# Patient Record
Sex: Male | Born: 1960 | Race: White | Hispanic: No | Smoking: Current every day smoker
Health system: Southern US, Community
[De-identification: ages and names within clinical notes are randomized; demographics above are authoritative.]

## PROBLEM LIST (undated history)

## (undated) DIAGNOSIS — I2510 Atherosclerotic heart disease of native coronary artery without angina pectoris: Secondary | ICD-10-CM

## (undated) DIAGNOSIS — G4730 Sleep apnea, unspecified: Secondary | ICD-10-CM

## (undated) DIAGNOSIS — I8390 Asymptomatic varicose veins of unspecified lower extremity: Secondary | ICD-10-CM

## (undated) DIAGNOSIS — N400 Enlarged prostate without lower urinary tract symptoms: Secondary | ICD-10-CM

## (undated) DIAGNOSIS — M14671 Charcot's joint, right ankle and foot: Secondary | ICD-10-CM

## (undated) DIAGNOSIS — G629 Polyneuropathy, unspecified: Secondary | ICD-10-CM

## (undated) DIAGNOSIS — E785 Hyperlipidemia, unspecified: Secondary | ICD-10-CM

## (undated) DIAGNOSIS — G2581 Restless legs syndrome: Secondary | ICD-10-CM

## (undated) DIAGNOSIS — I771 Stricture of artery: Secondary | ICD-10-CM

## (undated) DIAGNOSIS — M1990 Unspecified osteoarthritis, unspecified site: Secondary | ICD-10-CM

## (undated) DIAGNOSIS — I10 Essential (primary) hypertension: Secondary | ICD-10-CM

## (undated) DIAGNOSIS — J45909 Unspecified asthma, uncomplicated: Secondary | ICD-10-CM

## (undated) DIAGNOSIS — I779 Disorder of arteries and arterioles, unspecified: Secondary | ICD-10-CM

## (undated) DIAGNOSIS — I5042 Chronic combined systolic (congestive) and diastolic (congestive) heart failure: Secondary | ICD-10-CM

## (undated) DIAGNOSIS — I739 Peripheral vascular disease, unspecified: Secondary | ICD-10-CM

## (undated) DIAGNOSIS — Z720 Tobacco use: Secondary | ICD-10-CM

## (undated) DIAGNOSIS — E1140 Type 2 diabetes mellitus with diabetic neuropathy, unspecified: Secondary | ICD-10-CM

## (undated) DIAGNOSIS — K469 Unspecified abdominal hernia without obstruction or gangrene: Secondary | ICD-10-CM

## (undated) DIAGNOSIS — E11621 Type 2 diabetes mellitus with foot ulcer: Secondary | ICD-10-CM

## (undated) DIAGNOSIS — I255 Ischemic cardiomyopathy: Secondary | ICD-10-CM

## (undated) DIAGNOSIS — J449 Chronic obstructive pulmonary disease, unspecified: Secondary | ICD-10-CM

## (undated) DIAGNOSIS — E119 Type 2 diabetes mellitus without complications: Secondary | ICD-10-CM

## (undated) DIAGNOSIS — R55 Syncope and collapse: Secondary | ICD-10-CM

## (undated) DIAGNOSIS — I509 Heart failure, unspecified: Secondary | ICD-10-CM

## (undated) DIAGNOSIS — I4819 Other persistent atrial fibrillation: Secondary | ICD-10-CM

## (undated) DIAGNOSIS — I7090 Unspecified atherosclerosis: Secondary | ICD-10-CM

## (undated) DIAGNOSIS — L97519 Non-pressure chronic ulcer of other part of right foot with unspecified severity: Secondary | ICD-10-CM

## (undated) HISTORY — DX: Unspecified abdominal hernia without obstruction or gangrene: K46.9

## (undated) HISTORY — PX: PERIPHERAL ARTERIAL STENT GRAFT: SHX2220

## (undated) HISTORY — PX: CLAVICLE SURGERY: SHX598

## (undated) HISTORY — PX: APPENDECTOMY: SHX54

## (undated) HISTORY — DX: Peripheral vascular disease, unspecified: I73.9

## (undated) HISTORY — DX: Tobacco use: Z72.0

## (undated) HISTORY — DX: Syncope and collapse: R55

## (undated) HISTORY — DX: Other persistent atrial fibrillation: I48.19

## (undated) HISTORY — DX: Unspecified asthma, uncomplicated: J45.909

## (undated) HISTORY — PX: HERNIA REPAIR: SHX51

## (undated) HISTORY — DX: Essential (primary) hypertension: I10

## (undated) HISTORY — DX: Hyperlipidemia, unspecified: E78.5

## (undated) HISTORY — DX: Atherosclerotic heart disease of native coronary artery without angina pectoris: I25.10

## (undated) HISTORY — DX: Morbid (severe) obesity due to excess calories: E66.01

---

## 2003-10-11 ENCOUNTER — Other Ambulatory Visit: Payer: Self-pay

## 2004-02-01 ENCOUNTER — Other Ambulatory Visit: Payer: Self-pay

## 2004-11-21 ENCOUNTER — Ambulatory Visit: Payer: Self-pay | Admitting: General Surgery

## 2005-10-12 ENCOUNTER — Emergency Department: Payer: Self-pay | Admitting: Emergency Medicine

## 2005-10-17 ENCOUNTER — Emergency Department: Payer: Self-pay | Admitting: Emergency Medicine

## 2006-01-07 ENCOUNTER — Emergency Department: Payer: Self-pay | Admitting: Emergency Medicine

## 2006-10-25 ENCOUNTER — Emergency Department: Payer: Self-pay | Admitting: Emergency Medicine

## 2007-01-08 ENCOUNTER — Emergency Department: Payer: Self-pay | Admitting: Emergency Medicine

## 2007-01-08 ENCOUNTER — Other Ambulatory Visit: Payer: Self-pay

## 2007-03-24 ENCOUNTER — Other Ambulatory Visit: Payer: Self-pay

## 2007-03-24 ENCOUNTER — Ambulatory Visit: Payer: Self-pay | Admitting: Surgery

## 2007-03-31 ENCOUNTER — Ambulatory Visit: Payer: Self-pay | Admitting: Surgery

## 2007-08-20 ENCOUNTER — Emergency Department: Payer: Self-pay | Admitting: Internal Medicine

## 2009-12-28 ENCOUNTER — Emergency Department: Payer: Self-pay | Admitting: Emergency Medicine

## 2010-02-08 HISTORY — PX: CARDIAC CATHETERIZATION: SHX172

## 2010-02-13 ENCOUNTER — Emergency Department: Payer: Self-pay | Admitting: Emergency Medicine

## 2010-02-28 ENCOUNTER — Ambulatory Visit: Payer: Self-pay | Admitting: Cardiovascular Disease

## 2010-07-20 ENCOUNTER — Ambulatory Visit: Payer: Self-pay | Admitting: Physician Assistant

## 2010-08-13 ENCOUNTER — Ambulatory Visit: Payer: Self-pay | Admitting: Vascular Surgery

## 2010-09-10 ENCOUNTER — Ambulatory Visit: Payer: Self-pay | Admitting: Vascular Surgery

## 2011-01-11 ENCOUNTER — Emergency Department: Payer: Self-pay | Admitting: Emergency Medicine

## 2011-04-25 ENCOUNTER — Emergency Department: Payer: Self-pay | Admitting: Emergency Medicine

## 2011-08-12 ENCOUNTER — Emergency Department: Payer: Self-pay | Admitting: *Deleted

## 2011-12-15 ENCOUNTER — Emergency Department: Payer: Self-pay | Admitting: *Deleted

## 2011-12-16 LAB — CBC
HCT: 44 % (ref 40.0–52.0)
HGB: 15 g/dL (ref 13.0–18.0)
MCH: 30 pg (ref 26.0–34.0)
MCHC: 34.1 g/dL (ref 32.0–36.0)
MCV: 88 fL (ref 80–100)
Platelet: 241 10*3/uL (ref 150–440)
RBC: 5 10*6/uL (ref 4.40–5.90)
RDW: 14 % (ref 11.5–14.5)
WBC: 8.3 10*3/uL (ref 3.8–10.6)

## 2011-12-16 LAB — COMPREHENSIVE METABOLIC PANEL
Albumin: 3.5 g/dL (ref 3.4–5.0)
Alkaline Phosphatase: 105 U/L (ref 50–136)
Anion Gap: 10 (ref 7–16)
BUN: 10 mg/dL (ref 7–18)
Bilirubin,Total: 0.5 mg/dL (ref 0.2–1.0)
Calcium, Total: 8.6 mg/dL (ref 8.5–10.1)
Chloride: 100 mmol/L (ref 98–107)
Co2: 26 mmol/L (ref 21–32)
Creatinine: 1.18 mg/dL (ref 0.60–1.30)
EGFR (African American): >60
EGFR (Non-African Amer.): >60
Glucose: 380 mg/dL — ABNORMAL HIGH (ref 65–99)
Osmolality: 287 (ref 275–301)
Potassium: 3.6 mmol/L (ref 3.5–5.1)
SGOT(AST): 27 U/L (ref 15–37)
SGPT (ALT): 30 U/L
Sodium: 136 mmol/L (ref 136–145)
Total Protein: 7.2 g/dL (ref 6.4–8.2)

## 2011-12-16 LAB — TROPONIN I: Troponin-I: 0.04 ng/mL

## 2012-01-09 ENCOUNTER — Emergency Department: Payer: Self-pay | Admitting: *Deleted

## 2012-03-15 ENCOUNTER — Emergency Department: Payer: Self-pay | Admitting: Emergency Medicine

## 2012-03-15 LAB — COMPREHENSIVE METABOLIC PANEL
Albumin: 3.8 g/dL (ref 3.4–5.0)
Alkaline Phosphatase: 100 U/L (ref 50–136)
Anion Gap: 13 (ref 7–16)
BUN: 13 mg/dL (ref 7–18)
Bilirubin,Total: 0.6 mg/dL (ref 0.2–1.0)
Calcium, Total: 8.6 mg/dL (ref 8.5–10.1)
Chloride: 99 mmol/L (ref 98–107)
Co2: 25 mmol/L (ref 21–32)
Creatinine: 1.03 mg/dL (ref 0.60–1.30)
EGFR (African American): >60
EGFR (Non-African Amer.): >60
Glucose: 336 mg/dL — ABNORMAL HIGH (ref 65–99)
Osmolality: 287 (ref 275–301)
Potassium: 3.9 mmol/L (ref 3.5–5.1)
SGOT(AST): 31 U/L (ref 15–37)
SGPT (ALT): 35 U/L (ref 12–78)
Sodium: 137 mmol/L (ref 136–145)
Total Protein: 7.3 g/dL (ref 6.4–8.2)

## 2012-03-15 LAB — CBC
HCT: 41.8 % (ref 40.0–52.0)
HGB: 14.6 g/dL (ref 13.0–18.0)
MCH: 30.4 pg (ref 26.0–34.0)
MCHC: 34.9 g/dL (ref 32.0–36.0)
MCV: 87 fL (ref 80–100)
Platelet: 216 10*3/uL (ref 150–440)
RBC: 4.8 10*6/uL (ref 4.40–5.90)
RDW: 14.4 % (ref 11.5–14.5)
WBC: 6.6 10*3/uL (ref 3.8–10.6)

## 2012-03-15 LAB — TROPONIN I: Troponin-I: <0.02 ng/mL

## 2012-03-15 LAB — CK TOTAL AND CKMB (NOT AT ARMC)
CK, Total: 414 U/L — ABNORMAL HIGH (ref 35–232)
CK-MB: 7.3 ng/mL — ABNORMAL HIGH (ref 0.5–3.6)

## 2012-03-27 DIAGNOSIS — I739 Peripheral vascular disease, unspecified: Secondary | ICD-10-CM | POA: Insufficient documentation

## 2012-05-19 DIAGNOSIS — E1140 Type 2 diabetes mellitus with diabetic neuropathy, unspecified: Secondary | ICD-10-CM | POA: Insufficient documentation

## 2012-05-19 DIAGNOSIS — J449 Chronic obstructive pulmonary disease, unspecified: Secondary | ICD-10-CM | POA: Insufficient documentation

## 2013-02-19 ENCOUNTER — Emergency Department: Payer: Self-pay | Admitting: Emergency Medicine

## 2013-02-19 LAB — CBC
HCT: 45.2 % (ref 40.0–52.0)
HGB: 15.6 g/dL (ref 13.0–18.0)
MCH: 29.5 pg (ref 26.0–34.0)
MCHC: 34.6 g/dL (ref 32.0–36.0)
MCV: 85 fL (ref 80–100)
Platelet: 214 10*3/uL (ref 150–440)
RBC: 5.29 10*6/uL (ref 4.40–5.90)
RDW: 13.8 % (ref 11.5–14.5)
WBC: 8.8 10*3/uL (ref 3.8–10.6)

## 2013-02-20 LAB — COMPREHENSIVE METABOLIC PANEL
Albumin: 3.4 g/dL (ref 3.4–5.0)
Alkaline Phosphatase: 120 U/L (ref 50–136)
Anion Gap: 4 — ABNORMAL LOW (ref 7–16)
BUN: 7 mg/dL (ref 7–18)
Bilirubin,Total: 0.5 mg/dL (ref 0.2–1.0)
Calcium, Total: 8.7 mg/dL (ref 8.5–10.1)
Chloride: 101 mmol/L (ref 98–107)
Co2: 27 mmol/L (ref 21–32)
Creatinine: 0.78 mg/dL (ref 0.60–1.30)
EGFR (African American): >60
EGFR (Non-African Amer.): >60
Glucose: 415 mg/dL — ABNORMAL HIGH (ref 65–99)
Osmolality: 280 (ref 275–301)
Potassium: 4.2 mmol/L (ref 3.5–5.1)
SGOT(AST): 22 U/L (ref 15–37)
SGPT (ALT): 25 U/L (ref 12–78)
Sodium: 132 mmol/L — ABNORMAL LOW (ref 136–145)
Total Protein: 6.9 g/dL (ref 6.4–8.2)

## 2013-03-10 HISTORY — PX: CARDIAC CATHETERIZATION: SHX172

## 2013-03-11 ENCOUNTER — Ambulatory Visit: Payer: Self-pay

## 2013-04-03 LAB — CBC
HCT: 42 % (ref 40.0–52.0)
HGB: 14.6 g/dL (ref 13.0–18.0)
MCH: 29.7 pg (ref 26.0–34.0)
MCHC: 34.6 g/dL (ref 32.0–36.0)
MCV: 86 fL (ref 80–100)
Platelet: 235 10*3/uL (ref 150–440)
RBC: 4.91 10*6/uL (ref 4.40–5.90)
RDW: 14.3 % (ref 11.5–14.5)
WBC: 8.8 10*3/uL (ref 3.8–10.6)

## 2013-04-03 LAB — COMPREHENSIVE METABOLIC PANEL
Albumin: 3.4 g/dL (ref 3.4–5.0)
Alkaline Phosphatase: 104 U/L (ref 50–136)
Anion Gap: 6 — ABNORMAL LOW (ref 7–16)
BUN: 9 mg/dL (ref 7–18)
Bilirubin,Total: 0.3 mg/dL (ref 0.2–1.0)
Calcium, Total: 8.9 mg/dL (ref 8.5–10.1)
Chloride: 101 mmol/L (ref 98–107)
Co2: 29 mmol/L (ref 21–32)
Creatinine: 0.86 mg/dL (ref 0.60–1.30)
EGFR (African American): >60
EGFR (Non-African Amer.): >60
Glucose: 299 mg/dL — ABNORMAL HIGH (ref 65–99)
Osmolality: 282 (ref 275–301)
Potassium: 4 mmol/L (ref 3.5–5.1)
SGOT(AST): 27 U/L (ref 15–37)
SGPT (ALT): 24 U/L (ref 12–78)
Sodium: 136 mmol/L (ref 136–145)
Total Protein: 7 g/dL (ref 6.4–8.2)

## 2013-04-03 LAB — TROPONIN I: Troponin-I: <0.02 ng/mL

## 2013-04-04 ENCOUNTER — Inpatient Hospital Stay: Payer: Self-pay | Admitting: Internal Medicine

## 2013-04-04 LAB — TROPONIN I
Troponin-I: 0.03 ng/mL
Troponin-I: 0.02 ng/mL

## 2013-04-04 LAB — HEMOGLOBIN A1C: Hemoglobin A1C: 9.8 % — ABNORMAL HIGH (ref 4.2–6.3)

## 2013-04-04 LAB — CK-MB
CK-MB: 6.6 ng/mL — ABNORMAL HIGH (ref 0.5–3.6)
CK-MB: 6 ng/mL — ABNORMAL HIGH (ref 0.5–3.6)

## 2013-04-05 DIAGNOSIS — I200 Unstable angina: Secondary | ICD-10-CM

## 2013-04-05 DIAGNOSIS — E1165 Type 2 diabetes mellitus with hyperglycemia: Secondary | ICD-10-CM

## 2013-04-05 DIAGNOSIS — F17200 Nicotine dependence, unspecified, uncomplicated: Secondary | ICD-10-CM

## 2013-04-05 DIAGNOSIS — Z8679 Personal history of other diseases of the circulatory system: Secondary | ICD-10-CM

## 2013-04-05 DIAGNOSIS — IMO0001 Reserved for inherently not codable concepts without codable children: Secondary | ICD-10-CM

## 2013-04-05 DIAGNOSIS — R079 Chest pain, unspecified: Secondary | ICD-10-CM

## 2013-04-05 DIAGNOSIS — I10 Essential (primary) hypertension: Secondary | ICD-10-CM

## 2013-04-05 LAB — LIPID PANEL
Cholesterol: 211 mg/dL — ABNORMAL HIGH (ref 0–200)
HDL Cholesterol: 34 mg/dL — ABNORMAL LOW (ref 40–60)
Ldl Cholesterol, Calc: 113 mg/dL — ABNORMAL HIGH (ref 0–100)
Triglycerides: 321 mg/dL — ABNORMAL HIGH (ref 0–200)
VLDL Cholesterol, Calc: 64 mg/dL — ABNORMAL HIGH (ref 5–40)

## 2013-04-06 DIAGNOSIS — I2510 Atherosclerotic heart disease of native coronary artery without angina pectoris: Secondary | ICD-10-CM

## 2013-04-06 LAB — BASIC METABOLIC PANEL
Anion Gap: 3 — ABNORMAL LOW (ref 7–16)
BUN: 10 mg/dL (ref 7–18)
Calcium, Total: 9 mg/dL (ref 8.5–10.1)
Chloride: 104 mmol/L (ref 98–107)
Co2: 33 mmol/L — ABNORMAL HIGH (ref 21–32)
Creatinine: 0.77 mg/dL (ref 0.60–1.30)
EGFR (African American): >60
EGFR (Non-African Amer.): >60
Glucose: 238 mg/dL — ABNORMAL HIGH (ref 65–99)
Osmolality: 286 (ref 275–301)
Potassium: 4.8 mmol/L (ref 3.5–5.1)
Sodium: 140 mmol/L (ref 136–145)

## 2013-04-06 LAB — CBC
HCT: 42.9 % (ref 40.0–52.0)
HGB: 14.7 g/dL (ref 13.0–18.0)
MCH: 29.1 pg (ref 26.0–34.0)
MCHC: 34.2 g/dL (ref 32.0–36.0)
MCV: 85 fL (ref 80–100)
Platelet: 227 10*3/uL (ref 150–440)
RBC: 5.03 10*6/uL (ref 4.40–5.90)
RDW: 13.7 % (ref 11.5–14.5)
WBC: 7 10*3/uL (ref 3.8–10.6)

## 2013-04-06 LAB — PROTIME-INR
INR: 0.9
Prothrombin Time: 12.2 secs (ref 11.5–14.7)

## 2013-04-07 ENCOUNTER — Encounter: Payer: Self-pay | Admitting: *Deleted

## 2013-04-07 ENCOUNTER — Telehealth: Payer: Self-pay

## 2013-04-07 ENCOUNTER — Encounter: Payer: Self-pay | Admitting: Cardiovascular Disease

## 2013-04-07 DIAGNOSIS — I200 Unstable angina: Secondary | ICD-10-CM

## 2013-04-07 DIAGNOSIS — E785 Hyperlipidemia, unspecified: Secondary | ICD-10-CM

## 2013-04-07 DIAGNOSIS — IMO0001 Reserved for inherently not codable concepts without codable children: Secondary | ICD-10-CM

## 2013-04-07 DIAGNOSIS — F17200 Nicotine dependence, unspecified, uncomplicated: Secondary | ICD-10-CM

## 2013-04-07 DIAGNOSIS — E1165 Type 2 diabetes mellitus with hyperglycemia: Secondary | ICD-10-CM

## 2013-04-07 DIAGNOSIS — I10 Essential (primary) hypertension: Secondary | ICD-10-CM

## 2013-04-07 DIAGNOSIS — Z8679 Personal history of other diseases of the circulatory system: Secondary | ICD-10-CM

## 2013-04-07 LAB — BASIC METABOLIC PANEL
Anion Gap: 5 — ABNORMAL LOW (ref 7–16)
BUN: 8 mg/dL (ref 7–18)
Calcium, Total: 8.8 mg/dL (ref 8.5–10.1)
Chloride: 104 mmol/L (ref 98–107)
Co2: 29 mmol/L (ref 21–32)
Creatinine: 0.6 mg/dL (ref 0.60–1.30)
EGFR (African American): >60
EGFR (Non-African Amer.): >60
Glucose: 164 mg/dL — ABNORMAL HIGH (ref 65–99)
Osmolality: 278 (ref 275–301)
Potassium: 3.9 mmol/L (ref 3.5–5.1)
Sodium: 138 mmol/L (ref 136–145)

## 2013-04-07 LAB — CK TOTAL AND CKMB (NOT AT ARMC)
CK, Total: 116 U/L (ref 35–232)
CK-MB: 4 ng/mL — ABNORMAL HIGH (ref 0.5–3.6)

## 2013-04-07 NOTE — Telephone Encounter (Signed)
Patient contacted regarding discharge from [facility] on 04/07/13.  Patient understands to follow up with provider Dr. Fletcher Anon on 04/15/13 at 1:45 at [facility]. Patient understands discharge instructions? yes Patient understands medications and regiment? yes Patient understands to bring all medications to this visit? yes

## 2013-04-15 ENCOUNTER — Ambulatory Visit (INDEPENDENT_AMBULATORY_CARE_PROVIDER_SITE_OTHER): Payer: BC Managed Care – PPO | Admitting: Cardiovascular Disease

## 2013-04-15 ENCOUNTER — Encounter: Payer: Self-pay | Admitting: Cardiovascular Disease

## 2013-04-15 VITALS — BP 122/84 | HR 87 | Ht 67.0 in | Wt 251.2 lb

## 2013-04-15 DIAGNOSIS — I10 Essential (primary) hypertension: Secondary | ICD-10-CM | POA: Insufficient documentation

## 2013-04-15 DIAGNOSIS — R0602 Shortness of breath: Secondary | ICD-10-CM

## 2013-04-15 DIAGNOSIS — I739 Peripheral vascular disease, unspecified: Secondary | ICD-10-CM

## 2013-04-15 DIAGNOSIS — Z720 Tobacco use: Secondary | ICD-10-CM

## 2013-04-15 DIAGNOSIS — I2510 Atherosclerotic heart disease of native coronary artery without angina pectoris: Secondary | ICD-10-CM

## 2013-04-15 DIAGNOSIS — E785 Hyperlipidemia, unspecified: Secondary | ICD-10-CM

## 2013-04-15 MED ORDER — ALBUTEROL SULFATE HFA 108 (90 BASE) MCG/ACT IN AERS: 2.0000 | INHALATION_SPRAY | Freq: Four times a day (QID) | RESPIRATORY_TRACT | Status: DC | PRN

## 2013-04-15 MED ORDER — FUROSEMIDE 20 MG PO TABS: 20.0000 mg | ORAL_TABLET | Freq: Every day | ORAL | Status: DC

## 2013-04-15 NOTE — Assessment & Plan Note (Signed)
He clearly has peripheral neuropathy. However, he has also lifestyle limiting claudication affecting both calves with previous SFA stenting in 2012. He has not had any noninvasive followup done since then. I recommend lower extremity arterial Doppler and duplex.

## 2013-04-15 NOTE — Progress Notes (Signed)
HPI  This is a 52 year old man who is here today for a followup visit after recent hospitalization at [facility]. He has known history of coronary artery disease with previous stenting of the LAD last year at [facility]. He also has known history of peripheral arterial disease status post bilateral SFA stenting in 2012 by Dr. Lucky Cowboy. Other medical problems include type 2 diabetes, hypertension, hyperlipidemia and tobacco use. He presented recently with chest pain at rest suggestive of unstable angina. He ruled out for myocardial infarction. Nuclear stress test showed fixed inferior wall defect with mildly reduced LV systolic function. I proceeded with cardiac catheterization via the right radial artery which showed patent LAD stent with 90% proximal RCA stenosis and an ejection fraction of 50%. I placed on drug-eluting stent placement to the RCA without complications. He reports no recurrent chest pain since then. He does complain of dyspnea and lower extremity edema. His biggest complaint seems to be bilateral calf discomfort after walking less than half a block. This forces him to stop for a few minutes before he can resume. He also has bilateral foot discomfort described as numbness mostly when he wakes up in the morning. There is usually improves with walking.   He is trying to cut down on tobacco use. He ran out of his inhaler.  Not on File   Current Outpatient Prescriptions on File Prior to Visit  Medication Sig Dispense Refill  . aspirin 81 MG tablet Take 81 mg by mouth daily.      [name] atorvastatin (LIPITOR) 40 MG tablet Take 40 mg by mouth daily.      . clopidogrel (PLAVIX) 75 MG tablet Take 75 mg by mouth daily.      [name] gabapentin (NEURONTIN) 800 MG tablet Take 800 mg by mouth 3 (three) times daily.      [name] lisinopril (PRINIVIL,ZESTRIL) 10 MG tablet Take 10 mg by mouth daily.      . metformin (FORTAMET) 1000 MG (OSM) 24 hr tablet Take 1,000 mg by mouth 2 (two) times daily with a meal.      . metoprolol  tartrate (LOPRESSOR) 25 MG tablet Take 25 mg by mouth daily.       . nitroGLYCERIN (NITROSTAT) 0.4 MG SL tablet Place 0.4 mg under the tongue every 5 (five) minutes as needed for chest pain.       No current facility-administered medications on file prior to visit.     Past Medical History  Diagnosis Date  . Peripheral vascular disease   . Diabetes mellitus without complication   . Morbid obesity   . Syncope and collapse   . CHF (congestive heart failure)   . Asthma   . Hernia   . PAD (peripheral artery disease)     Angiography in March of 2012 showed 70-80% proximal right SFA stenosis status post self-expanding stent placement (6X60 mm). Angiography in April 2012 showed proximal left SFA occlusion status post self-expanding stent placement (7 X unkonw length). Both done by Dr. Lucky Cowboy  . Coronary artery disease     Status post LAD PCI in 2013 at [facility]. Presented in October of 2014 to [facility] with unstable angina. Nuclear stress test showed inferior wall defect. Cardiac catheterization showed patent LAD stent with 90% stenosis in the proximal RCA. Ejection fraction was 50%. Status post PCI of the RCA with a 2.5 x 23 mm drug-eluting stent  . Hyperlipidemia   . Hypertension   . Tobacco use      Past Surgical History  Procedure Laterality Date  . Appendectomy    . Cardiac catheterization  10/14    [facility]; X1 STENT PROXIMAL RCA  . Cardiac catheterization  02/2010    [facility]  . Peripheral arterial stent graft      x2 left/right  . Clavicle surgery    . Hernia repair       Family History  Problem Relation Age of Onset  . Heart attack Father   . Heart disease Father   . Hypertension Father   . Hyperlipidemia Father   . Hypertension Mother   . Hyperlipidemia Mother      History   Social History  . Marital Status: Single    Spouse Name: N/A    Number of Children: N/A  . Years of Education: N/A   Occupational History  . Not on file.   Social History Main Topics  . Smoking  status: Current Every Day Smoker -- 2.00 packs/day for 41 years    Types: Cigarettes  . Smokeless tobacco: Not on file  . Alcohol Use: No  . Drug Use: No  . Sexual Activity: Not on file   Other Topics Concern  . Not on file   Social History Narrative  . No narrative on file      PHYSICAL EXAM   BP 122/84  Pulse 87  Ht 5\' 7"  (1.702 m)  Wt 251 lb 4 oz (113.966 kg)  BMI 39.34 kg/m2 Constitutional: He is oriented to person, place, and time. He appears well-developed and well-nourished. No distress.  HENT: No nasal discharge.  Head: Normocephalic and atraumatic.  Eyes: Pupils are equal and round.  No discharge. Neck: Normal range of motion. Neck supple. No JVD present. No thyromegaly present.  Cardiovascular: Normal rate, regular rhythm, normal heart sounds. Exam reveals no gallop and no friction rub. No murmur heard.  Pulmonary/Chest: Effort normal and breath sounds normal. No stridor. No respiratory distress. He has no wheezes. He has no rales. He exhibits no tenderness.  Abdominal: Soft. Bowel sounds are normal. He exhibits no distension. There is no tenderness. There is no rebound and no guarding.  Musculoskeletal: Normal range of motion. He exhibits +1 edema and no tenderness.  Neurological: He is alert and oriented to person, place, and time. Coordination normal.  Skin: Skin is warm and dry. No rash noted. He is not diaphoretic. No erythema. No pallor.  Psychiatric: He has a normal mood and affect. His behavior is normal. Judgment and thought content normal.  Right radial pulse is normal with no hematoma. Distal pulses are not palpable.     [id number]  Rhythm  -Left axis.   -  Diffuse nonspecific T-abnormality.   ABNORMAL    ASSESSMENT AND PLAN

## 2013-04-15 NOTE — Assessment & Plan Note (Signed)
He is doing well with no recurrent angina. Continue dual antiplatelet therapy for at least 12 months. I had a prolonged discussion with him about the importance of lifestyle changes. He is still not fully recovered. He can continue working at limited capacity with no heavy lifting.

## 2013-04-15 NOTE — Assessment & Plan Note (Signed)
Continue treatment with atorvastatin. He will need a followup lipid and liver profile.

## 2013-04-15 NOTE — Patient Instructions (Signed)
Your physician has requested that you have a lower or upper extremity arterial duplex. This test is an ultrasound of the arteries in the legs or arms. It looks at arterial blood flow in the legs and arms. Allow one hour for Lower and Upper Arterial scans. There are no restrictions or special instructions  Start Lasix 20 mg once daily  Start Albuterol as directed.   Labs to be done in 5-10 days.   You may resume light duty work with no lifting of more than 30 lbs.   Follow up after tests.

## 2013-04-15 NOTE — Assessment & Plan Note (Signed)
I had a prolonged discussion with them about the importance of smoking cessation. 

## 2013-04-15 NOTE — Assessment & Plan Note (Signed)
Blood pressure is well controlled on current medications. He appears to be mildly fluid overloaded and thus on starting him on Lasix 20 mg once daily. Check basic metabolic profile in [id number] days.

## 2013-04-21 ENCOUNTER — Ambulatory Visit (INDEPENDENT_AMBULATORY_CARE_PROVIDER_SITE_OTHER): Payer: BC Managed Care – PPO

## 2013-04-21 DIAGNOSIS — I10 Essential (primary) hypertension: Secondary | ICD-10-CM

## 2013-04-22 LAB — BASIC METABOLIC PANEL
BUN/Creatinine Ratio: 13 (ref 9–20)
BUN: 11 mg/dL (ref 6–24)
CO2: 24 mmol/L (ref 18–29)
Calcium: 9.3 mg/dL (ref 8.7–10.2)
Chloride: 93 mmol/L — ABNORMAL LOW (ref 97–108)
Creatinine, Ser: 0.83 mg/dL (ref 0.76–1.27)
GFR calc Af Amer: 119 mL/min/{1.73_m2} (ref >59)
GFR calc non Af Amer: 103 mL/min/{1.73_m2} (ref >59)
Glucose: 144 mg/dL — ABNORMAL HIGH (ref 65–99)
Potassium: 5 mmol/L (ref 3.5–5.2)
Sodium: 134 mmol/L (ref 134–144)

## 2013-04-23 ENCOUNTER — Telehealth: Payer: Self-pay

## 2013-04-23 NOTE — Telephone Encounter (Signed)
Left detailed message w/ results.   Asked pt to call with any questions or concerns.

## 2013-04-23 NOTE — Telephone Encounter (Signed)
Message copied by Stana Bunting on Fri Apr 23, 2013 10:06 AM ------      Message from: [name] A      Created: Thu Apr 22, 2013 10:45 PM       Normal labs after starting Lasix. ------

## 2013-04-29 ENCOUNTER — Encounter (INDEPENDENT_AMBULATORY_CARE_PROVIDER_SITE_OTHER): Payer: BC Managed Care – PPO

## 2013-04-29 DIAGNOSIS — I739 Peripheral vascular disease, unspecified: Secondary | ICD-10-CM

## 2013-05-04 ENCOUNTER — Ambulatory Visit (INDEPENDENT_AMBULATORY_CARE_PROVIDER_SITE_OTHER): Payer: BC Managed Care – PPO | Admitting: Cardiovascular Disease

## 2013-05-04 ENCOUNTER — Encounter: Payer: Self-pay | Admitting: *Deleted

## 2013-05-04 ENCOUNTER — Encounter: Payer: Self-pay | Admitting: Cardiovascular Disease

## 2013-05-04 VITALS — BP 116/76 | HR 99 | Ht 66.0 in | Wt 248.2 lb

## 2013-05-04 DIAGNOSIS — I2510 Atherosclerotic heart disease of native coronary artery without angina pectoris: Secondary | ICD-10-CM

## 2013-05-04 DIAGNOSIS — I739 Peripheral vascular disease, unspecified: Secondary | ICD-10-CM

## 2013-05-04 DIAGNOSIS — Z5181 Encounter for therapeutic drug level monitoring: Secondary | ICD-10-CM

## 2013-05-04 DIAGNOSIS — E785 Hyperlipidemia, unspecified: Secondary | ICD-10-CM

## 2013-05-04 DIAGNOSIS — F17200 Nicotine dependence, unspecified, uncomplicated: Secondary | ICD-10-CM

## 2013-05-04 DIAGNOSIS — I10 Essential (primary) hypertension: Secondary | ICD-10-CM

## 2013-05-04 DIAGNOSIS — Z01810 Encounter for preprocedural cardiovascular examination: Secondary | ICD-10-CM

## 2013-05-04 DIAGNOSIS — Z720 Tobacco use: Secondary | ICD-10-CM

## 2013-05-04 NOTE — Assessment & Plan Note (Signed)
I discussed with him the importance of smoking cessation. I explained to him that the long-term patency in peripheral arterial disease is significantly lower in patient who continue to smoke.

## 2013-05-04 NOTE — Assessment & Plan Note (Signed)
Continue treatment with atorvastatin. 

## 2013-05-04 NOTE — Assessment & Plan Note (Signed)
Blood pressure is well controlled on current medications. 

## 2013-05-04 NOTE — Assessment & Plan Note (Signed)
He has severe lifestyle limiting claudication affecting both calves. The left SFA stent is occluded with significant disease affecting the proximal right common femoral artery. I discussed with him different management options including observation and a walking program. However, he feels extremely limited by this which has affected his ability to perform his work. Due to that, I recommend proceeding with abdominal aortogram, lower extremity runoff and possible angioplasty. Risks, benefits and alternatives were discussed with the patient. Continue dual antiplatelet therapy. I will plan access via the right common femoral artery.

## 2013-05-04 NOTE — Progress Notes (Signed)
primary care physician: Dr. Calla Kicks  HPI  This is a 52 year old man who is here today for a followup visit regarding CAD and PAD.  He has known history of coronary artery disease with previous stenting of the LAD last year at [facility]. He also has known history of peripheral arterial disease status post bilateral SFA stenting in 2012 by Dr. Lucky Cowboy. Other medical problems include type 2 diabetes, hypertension, hyperlipidemia and tobacco use. He presented in October, 2014 with chest pain at rest suggestive of unstable angina. He ruled out for myocardial infarction. Nuclear stress test showed fixed inferior wall defect with mildly reduced LV systolic function. I proceeded with cardiac catheterization via the right radial artery which showed patent LAD stent with 90% proximal RCA stenosis and an ejection fraction of 50%. I placed on drug-eluting stent placement to the RCA without complications. He reports no recurrent chest pain since then.   His biggest complaint seems to be bilateral calf discomfort after walking less than half a block. This forces him to stop for a few minutes before he can resume. He also has bilateral foot discomfort described as numbness mostly when he wakes up in the morning. There is usually improves with walking.  He is trying to cut down on tobacco use.  ABI was mildly reduced bilaterally with patent R SFA stent. The left SFA stent was occluded with significant stenosis in proximal right common femoral artery.    No Known Allergies   Current Outpatient Prescriptions on File Prior to Visit  Medication Sig Dispense Refill  . albuterol (PROVENTIL HFA;VENTOLIN HFA) 108 (90 BASE) MCG/ACT inhaler Inhale 2 puffs into the lungs every 6 (six) hours as needed for wheezing or shortness of breath.  1 Inhaler  2  . aspirin 81 MG tablet Take 81 mg by mouth daily.      [name] atorvastatin (LIPITOR) 40 MG tablet Take 40 mg by mouth daily.      . clopidogrel (PLAVIX) 75 MG tablet Take 75 mg by mouth  daily.      . furosemide (LASIX) 20 MG tablet Take 1 tablet (20 mg total) by mouth daily.  30 tablet  3  . gabapentin (NEURONTIN) 800 MG tablet Take 800 mg by mouth 3 (three) times daily.      [name] glipiZIDE (GLUCOTROL) 10 MG tablet Take 20 mg by mouth 2 (two) times daily before a meal.      . isosorbide mononitrate (IMDUR) 60 MG 24 hr tablet Take 60 mg by mouth daily.      [name] lisinopril (PRINIVIL,ZESTRIL) 10 MG tablet Take 10 mg by mouth daily.      . metformin (FORTAMET) 1000 MG (OSM) 24 hr tablet Take 1,000 mg by mouth 2 (two) times daily with a meal.      . metoprolol tartrate (LOPRESSOR) 25 MG tablet Take 25 mg by mouth daily.       . nitroGLYCERIN (NITROSTAT) 0.4 MG SL tablet Place 0.4 mg under the tongue every 5 (five) minutes as needed for chest pain.       No current facility-administered medications on file prior to visit.     Past Medical History  Diagnosis Date  . Peripheral vascular disease   . Diabetes mellitus without complication   . Morbid obesity   . Syncope and collapse   . CHF (congestive heart failure)   . Asthma   . Hernia   . PAD (peripheral artery disease)     Angiography in March of 2012  showed 70-80% proximal right SFA stenosis status post self-expanding stent placement (6X60 mm). Angiography in April 2012 showed proximal left SFA occlusion status post self-expanding stent placement (7 X unkonw length). Both done by Dr. Lucky Cowboy  . Coronary artery disease     Status post LAD PCI in 2013 at [facility]. Presented in October of 2014 to [facility] with unstable angina. Nuclear stress test showed inferior wall defect. Cardiac catheterization showed patent LAD stent with 90% stenosis in the proximal RCA. Ejection fraction was 50%. Status post PCI of the RCA with a 2.5 x 23 mm drug-eluting stent  . Hyperlipidemia   . Hypertension   . Tobacco use      Past Surgical History  Procedure Laterality Date  . Appendectomy    . Peripheral arterial stent graft      x2 left/right  . Clavicle  surgery    . Hernia repair    . Cardiac catheterization  10/14    [facility]; X1 STENT PROXIMAL RCA  . Cardiac catheterization  02/2010    [facility]     Family History  Problem Relation Age of Onset  . Heart attack Father   . Heart disease Father   . Hypertension Father   . Hyperlipidemia Father   . Hypertension Mother   . Hyperlipidemia Mother      History   Social History  . Marital Status: Single    Spouse Name: N/A    Number of Children: N/A  . Years of Education: N/A   Occupational History  . Not on file.   Social History Main Topics  . Smoking status: Current Every Day Smoker -- 2.00 packs/day for 41 years    Types: Cigarettes  . Smokeless tobacco: Not on file  . Alcohol Use: No  . Drug Use: No  . Sexual Activity: Not on file   Other Topics Concern  . Not on file   Social History Narrative  . No narrative on file      PHYSICAL EXAM   BP 116/76  Pulse 99  Ht 5\' 6"  (1.676 m)  Wt 248 lb 4 oz (112.605 kg)  BMI 40.09 kg/m2 Constitutional: He is oriented to person, place, and time. He appears well-developed and well-nourished. No distress.  HENT: No nasal discharge.  Head: Normocephalic and atraumatic.  Eyes: Pupils are equal and round.  No discharge. Neck: Normal range of motion. Neck supple. No JVD present. No thyromegaly present.  Cardiovascular: Normal rate, regular rhythm, normal heart sounds. Exam reveals no gallop and no friction rub. No murmur heard.  Pulmonary/Chest: Effort normal and breath sounds normal. No stridor. No respiratory distress. He has no wheezes. He has no rales. He exhibits no tenderness.  Abdominal: Soft. Bowel sounds are normal. He exhibits no distension. There is no tenderness. There is no rebound and no guarding.  Musculoskeletal: Normal range of motion. He exhibits +1 edema and no tenderness.  Neurological: He is alert and oriented to person, place, and time. Coordination normal.  Skin: Skin is warm and dry. No rash noted. He is not  diaphoretic. No erythema. No pallor.  Psychiatric: He has a normal mood and affect. His behavior is normal. Judgment and thought content normal.  Femoral pulses are mildly reduced.  Distal pulses are not palpable.      ASSESSMENT AND PLAN

## 2013-05-04 NOTE — Patient Instructions (Addendum)
Your physician has requested that you have a peripheral vascular angiogram. This exam is performed at the hospital. During this exam IV contrast is used to look at arterial blood flow. Please review the information sheet given for details.  Your procedure will be May 26, 2013 at [facility]   at 8:30 am. Please arrive at 6:30am.   Hold Metformin 1 day before, the day of and 2 days after.

## 2013-05-04 NOTE — Assessment & Plan Note (Signed)
No recurrent angina. Continue medical therapy.

## 2013-05-05 ENCOUNTER — Other Ambulatory Visit: Payer: Self-pay | Admitting: *Deleted

## 2013-05-05 ENCOUNTER — Encounter: Payer: Self-pay | Admitting: *Deleted

## 2013-05-05 LAB — PROTIME-INR
INR: 1 (ref 0.8–1.2)
Prothrombin Time: 9.7 s (ref 9.1–12.0)

## 2013-05-05 LAB — BASIC METABOLIC PANEL
BUN/Creatinine Ratio: 11 (ref 9–20)
BUN: 9 mg/dL (ref 6–24)
CO2: 27 mmol/L (ref 18–29)
Calcium: 9.6 mg/dL (ref 8.7–10.2)
Chloride: 99 mmol/L (ref 97–108)
Creatinine, Ser: 0.79 mg/dL (ref 0.76–1.27)
GFR calc Af Amer: 121 mL/min/{1.73_m2} (ref >59)
GFR calc non Af Amer: 105 mL/min/{1.73_m2} (ref >59)
Glucose: 150 mg/dL — ABNORMAL HIGH (ref 65–99)
Potassium: 4.8 mmol/L (ref 3.5–5.2)
Sodium: 144 mmol/L (ref 134–144)

## 2013-05-05 LAB — CBC WITH DIFFERENTIAL
Basophils Absolute: 0 10*3/uL (ref 0.0–0.2)
Basos: 0 %
Eos: 2 %
Eosinophils Absolute: 0.2 10*3/uL (ref 0.0–0.4)
HCT: 39.8 % (ref 37.5–51.0)
Hemoglobin: 13.9 g/dL (ref 12.6–17.7)
Immature Grans (Abs): 0 10*3/uL (ref 0.0–0.1)
Immature Granulocytes: 0 %
Lymphocytes Absolute: 1.4 10*3/uL (ref 0.7–3.1)
Lymphs: 19 %
MCH: 29.3 pg (ref 26.6–33.0)
MCHC: 34.9 g/dL (ref 31.5–35.7)
MCV: 84 fL (ref 79–97)
Monocytes Absolute: 0.5 10*3/uL (ref 0.1–0.9)
Monocytes: 7 %
Neutrophils Absolute: 5.5 10*3/uL (ref 1.4–7.0)
Neutrophils Relative %: 72 %
Platelets: 238 10*3/uL (ref 150–379)
RBC: 4.75 x10E6/uL (ref 4.14–5.80)
RDW: 14.2 % (ref 12.3–15.4)
WBC: 7.7 10*3/uL (ref 3.4–10.8)

## 2013-05-07 DIAGNOSIS — R0602 Shortness of breath: Secondary | ICD-10-CM

## 2013-05-07 DIAGNOSIS — I739 Peripheral vascular disease, unspecified: Secondary | ICD-10-CM

## 2013-05-07 DIAGNOSIS — E785 Hyperlipidemia, unspecified: Secondary | ICD-10-CM

## 2013-05-07 DIAGNOSIS — I2510 Atherosclerotic heart disease of native coronary artery without angina pectoris: Secondary | ICD-10-CM

## 2013-05-07 DIAGNOSIS — F17200 Nicotine dependence, unspecified, uncomplicated: Secondary | ICD-10-CM

## 2013-05-13 ENCOUNTER — Encounter (HOSPITAL_COMMUNITY): Payer: Self-pay | Admitting: Pharmacy Technician

## 2013-05-21 ENCOUNTER — Telehealth: Payer: Self-pay | Admitting: *Deleted

## 2013-05-21 NOTE — Telephone Encounter (Signed)
Patient called and said he could not get off work for this procedure on 05/26/13. He said he was not sure when he could reschedule and it might be after the 1st of next year. I explained to patient the importance of this  procedure and asked him to reschedule as soon as possible.   [facility] lab informed of cancellation

## 2013-05-26 ENCOUNTER — Ambulatory Visit (HOSPITAL_COMMUNITY)
Admission: RE | Admit: 2013-05-26 | Payer: BC Managed Care – PPO | Source: Ambulatory Visit | Admitting: Cardiovascular Disease

## 2013-05-26 ENCOUNTER — Encounter (HOSPITAL_COMMUNITY): Admission: RE | Payer: Self-pay | Source: Ambulatory Visit

## 2013-05-26 SURGERY — ABDOMINAL AORTAGRAM
Anesthesia: LOCAL

## 2013-06-14 ENCOUNTER — Encounter: Payer: Self-pay | Admitting: *Deleted

## 2013-06-14 ENCOUNTER — Telehealth: Payer: Self-pay | Admitting: *Deleted

## 2013-06-14 DIAGNOSIS — I739 Peripheral vascular disease, unspecified: Secondary | ICD-10-CM

## 2013-06-14 DIAGNOSIS — I2510 Atherosclerotic heart disease of native coronary artery without angina pectoris: Secondary | ICD-10-CM

## 2013-06-14 DIAGNOSIS — Z01810 Encounter for preprocedural cardiovascular examination: Secondary | ICD-10-CM

## 2013-06-14 NOTE — Telephone Encounter (Signed)
Reminded patient that I am sending him letter with procedure instructions. Informed patient to hold DM medications day of procedure and to hold metformin the day before, the day of, and 2 days after the procedure. Patient verbalized understanding.

## 2013-06-14 NOTE — Telephone Encounter (Signed)
Called patient to set up pre op labs and abd aortagram.

## 2013-06-14 NOTE — Telephone Encounter (Signed)
Called patient to inform him that he needs to come have his labs drawn 06/16/13 at 0830 and his procedure at [facility] is 0830 on 06/23/13

## 2013-06-16 ENCOUNTER — Ambulatory Visit (INDEPENDENT_AMBULATORY_CARE_PROVIDER_SITE_OTHER): Payer: BC Managed Care – PPO | Admitting: *Deleted

## 2013-06-16 DIAGNOSIS — I739 Peripheral vascular disease, unspecified: Secondary | ICD-10-CM

## 2013-06-16 DIAGNOSIS — I2510 Atherosclerotic heart disease of native coronary artery without angina pectoris: Secondary | ICD-10-CM

## 2013-06-16 DIAGNOSIS — Z01810 Encounter for preprocedural cardiovascular examination: Secondary | ICD-10-CM

## 2013-06-17 LAB — CBC WITH DIFFERENTIAL
Basophils Absolute: 0 10*3/uL (ref 0.0–0.2)
Basos: 0 %
Eos: 2 %
Eosinophils Absolute: 0.1 10*3/uL (ref 0.0–0.4)
HCT: 44.5 % (ref 37.5–51.0)
Hemoglobin: 15.4 g/dL (ref 12.6–17.7)
Immature Grans (Abs): 0 10*3/uL (ref 0.0–0.1)
Immature Granulocytes: 0 %
Lymphocytes Absolute: 1.4 10*3/uL (ref 0.7–3.1)
Lymphs: 20 %
MCH: 29.4 pg (ref 26.6–33.0)
MCHC: 34.6 g/dL (ref 31.5–35.7)
MCV: 85 fL (ref 79–97)
Monocytes Absolute: 0.4 10*3/uL (ref 0.1–0.9)
Monocytes: 6 %
Neutrophils Absolute: 4.9 10*3/uL (ref 1.4–7.0)
Neutrophils Relative %: 72 %
Platelets: 256 10*3/uL (ref 150–379)
RBC: 5.23 x10E6/uL (ref 4.14–5.80)
RDW: 14.1 % (ref 12.3–15.4)
WBC: 6.9 10*3/uL (ref 3.4–10.8)

## 2013-06-17 LAB — BASIC METABOLIC PANEL
BUN/Creatinine Ratio: 10 (ref 9–20)
BUN: 8 mg/dL (ref 6–24)
CO2: 25 mmol/L (ref 18–29)
Calcium: 9.8 mg/dL (ref 8.7–10.2)
Chloride: 92 mmol/L — ABNORMAL LOW (ref 97–108)
Creatinine, Ser: 0.77 mg/dL (ref 0.76–1.27)
GFR calc Af Amer: 122 mL/min/{1.73_m2} (ref >59)
GFR calc non Af Amer: 106 mL/min/{1.73_m2} (ref >59)
Glucose: 255 mg/dL — ABNORMAL HIGH (ref 65–99)
Potassium: 5.2 mmol/L (ref 3.5–5.2)
Sodium: 135 mmol/L (ref 134–144)

## 2013-06-17 LAB — PROTIME-INR
INR: 1 (ref 0.8–1.2)
Prothrombin Time: 10.1 s (ref 9.1–12.0)

## 2013-06-18 ENCOUNTER — Encounter (HOSPITAL_COMMUNITY): Payer: Self-pay | Admitting: Pharmacy Technician

## 2013-06-22 ENCOUNTER — Telehealth: Payer: Self-pay | Admitting: *Deleted

## 2013-06-22 NOTE — Telephone Encounter (Signed)
Patient called today and left voicemail asking " If the weather gets bad will Dr. Fletcher Anon still do my procedure at [facility]." patient stated they might not be home when I call back and that it is ok to leave voicemail with my reply.   Left voicemail for patient stating that Dr. Fletcher Anon will be there tomorrow for the procedure. Informed patient that he needed to call as early as possible if procedure needs to be cancelled.

## 2013-06-23 ENCOUNTER — Encounter (HOSPITAL_COMMUNITY)
Admission: RE | Disposition: A | Discharge status: Home / Self Care | Payer: Self-pay | Source: Ambulatory Visit | Attending: Cardiovascular Disease

## 2013-06-23 ENCOUNTER — Ambulatory Visit (HOSPITAL_COMMUNITY)
Admission: RE | Admit: 2013-06-23 | Discharge: 2013-06-23 | Disposition: A | Discharge status: Home / Self Care | Payer: BC Managed Care – PPO | Source: Ambulatory Visit | Attending: Cardiovascular Disease | Admitting: Cardiovascular Disease

## 2013-06-23 DIAGNOSIS — I70219 Atherosclerosis of native arteries of extremities with intermittent claudication, unspecified extremity: Secondary | ICD-10-CM

## 2013-06-23 DIAGNOSIS — I509 Heart failure, unspecified: Secondary | ICD-10-CM | POA: Insufficient documentation

## 2013-06-23 DIAGNOSIS — F17200 Nicotine dependence, unspecified, uncomplicated: Secondary | ICD-10-CM | POA: Insufficient documentation

## 2013-06-23 DIAGNOSIS — I10 Essential (primary) hypertension: Secondary | ICD-10-CM | POA: Insufficient documentation

## 2013-06-23 DIAGNOSIS — Z9861 Coronary angioplasty status: Secondary | ICD-10-CM | POA: Insufficient documentation

## 2013-06-23 DIAGNOSIS — Z6841 Body Mass Index (BMI) 40.0 and over, adult: Secondary | ICD-10-CM | POA: Insufficient documentation

## 2013-06-23 DIAGNOSIS — I2510 Atherosclerotic heart disease of native coronary artery without angina pectoris: Secondary | ICD-10-CM | POA: Insufficient documentation

## 2013-06-23 DIAGNOSIS — E785 Hyperlipidemia, unspecified: Secondary | ICD-10-CM | POA: Insufficient documentation

## 2013-06-23 DIAGNOSIS — J45909 Unspecified asthma, uncomplicated: Secondary | ICD-10-CM | POA: Insufficient documentation

## 2013-06-23 DIAGNOSIS — E119 Type 2 diabetes mellitus without complications: Secondary | ICD-10-CM | POA: Insufficient documentation

## 2013-06-23 HISTORY — PX: ABDOMINAL AORTAGRAM: SHX5454

## 2013-06-23 LAB — GLUCOSE, CAPILLARY
Glucose-Capillary: 254 mg/dL — ABNORMAL HIGH (ref 70–99)
Glucose-Capillary: 205 mg/dL — ABNORMAL HIGH (ref 70–99)

## 2013-06-23 SURGERY — ABDOMINAL AORTAGRAM
Anesthesia: LOCAL

## 2013-06-23 MED ORDER — LIDOCAINE HCL (PF) 1 % IJ SOLN
INTRAMUSCULAR | Status: AC
  Filled 2013-06-23: qty 30

## 2013-06-23 MED ORDER — SODIUM CHLORIDE 0.9 % IV SOLN: INTRAVENOUS | Status: AC

## 2013-06-23 MED ORDER — ASPIRIN 81 MG PO CHEW
81.0000 mg | CHEWABLE_TABLET | ORAL | Status: AC
  Administered 2013-06-23: 81 mg via ORAL

## 2013-06-23 MED ORDER — SODIUM CHLORIDE 0.9 % IV SOLN: 250.0000 mL | INTRAVENOUS | Status: DC | PRN

## 2013-06-23 MED ORDER — SODIUM CHLORIDE 0.9 % IV SOLN
INTRAVENOUS | Status: DC
  Administered 2013-06-23: 07:00:00 via INTRAVENOUS

## 2013-06-23 MED ORDER — FENTANYL CITRATE 0.05 MG/ML IJ SOLN
INTRAMUSCULAR | Status: AC
  Filled 2013-06-23: qty 2

## 2013-06-23 MED ORDER — SODIUM CHLORIDE 0.9 % IJ SOLN: 3.0000 mL | INTRAMUSCULAR | Status: DC | PRN

## 2013-06-23 MED ORDER — SODIUM CHLORIDE 0.9 % IJ SOLN: 3.0000 mL | Freq: Two times a day (BID) | INTRAMUSCULAR | Status: DC

## 2013-06-23 MED ORDER — ACETAMINOPHEN 325 MG PO TABS: 650.0000 mg | ORAL_TABLET | ORAL | Status: DC | PRN

## 2013-06-23 MED ORDER — ASPIRIN 81 MG PO CHEW
CHEWABLE_TABLET | ORAL | Status: AC
  Filled 2013-06-23: qty 1

## 2013-06-23 MED ORDER — HEPARIN (PORCINE) IN NACL 2-0.9 UNIT/ML-% IJ SOLN
INTRAMUSCULAR | Status: AC
  Filled 2013-06-23: qty 1000

## 2013-06-23 MED ORDER — MIDAZOLAM HCL 2 MG/2ML IJ SOLN
INTRAMUSCULAR | Status: AC
  Filled 2013-06-23: qty 2

## 2013-06-23 NOTE — Discharge Instructions (Signed)
PLEASE REMEMBER TO BRING ALL OF YOUR MEDICATIONS TO EACH OF YOUR FOLLOW-UP OFFICE VISITS. ° °PLEASE ATTEND ALL SCHEDULED FOLLOW-UP APPOINTMENTS.  ° °Activity: Increase activity slowly as tolerated. You may shower, but no soaking baths (or swimming) for 1 week. No driving for 2 days. No lifting over 5 lbs for 1 week. No sexual activity for 1 week.  ° °You May Return to Work: in 1 week (if applicable) ° °Wound Care: You may wash cath site gently with soap and water. Keep cath site clean and dry. If you notice pain, swelling, bleeding or pus at your cath site, please call [phone]. ° ° ° °Cardiac Cath Site Care °Refer to this sheet in the next few weeks. These instructions provide you with information on caring for yourself after your procedure. Your caregiver may also give you more specific instructions. Your treatment has been planned according to current medical practices, but problems sometimes occur. Call your caregiver if you have any problems or questions after your procedure. °HOME CARE INSTRUCTIONS °· You may shower 24 hours after the procedure. Remove the bandage (dressing) and gently wash the site with plain soap and water. Gently pat the site dry.  °· Do not apply powder or lotion to the site.  °· Do not sit in a bathtub, swimming pool, or whirlpool for 5 to 7 days.  °· No bending, squatting, or lifting anything over 10 pounds (4.5 kg) as directed by your caregiver.  °· Inspect the site at least twice daily.  °· Do not drive home if you are discharged the same day of the procedure. Have someone else drive you.  °· You may drive 24 hours after the procedure unless otherwise instructed by your caregiver.  °What to expect: °· Any bruising will usually fade within 1 to 2 weeks.  °· Blood that collects in the tissue (hematoma) may be painful to the touch. It should usually decrease in size and tenderness within 1 to 2 weeks.  °SEEK IMMEDIATE MEDICAL CARE IF: °· You have unusual pain at the site or down the  affected limb.  °· You have redness, warmth, swelling, or pain at the site.  °· You have drainage (other than a small amount of blood on the dressing).  °· You have chills.  °· You have a fever or persistent symptoms for more than 72 hours.  °· You have a fever and your symptoms suddenly get worse.  °· Your leg becomes pale, cool, tingly, or numb.  °· You have heavy bleeding from the site. Hold pressure on the site.  °Document Released: 06/29/2010 Document Revised: 05/16/2011 Document Reviewed: 06/29/2010 °[employer]® Patient Information ©2012 [employer]. ° °

## 2013-06-23 NOTE — H&P (Signed)
HPI   This is a 53 year old man who is here today for a followup visit regarding CAD and PAD.  He has known history of coronary artery disease with previous stenting of the LAD last year at [facility]. He also has known history of peripheral arterial disease status post bilateral SFA stenting in 2012 by Dr. Lucky Cowboy. Other medical problems include type 2 diabetes, hypertension, hyperlipidemia and tobacco use. He presented in October, 2014 with chest pain at rest suggestive of unstable angina. He ruled out for myocardial infarction. Nuclear stress test showed fixed inferior wall defect with mildly reduced LV systolic function. I proceeded with cardiac catheterization via the right radial artery which showed patent LAD stent with 90% proximal RCA stenosis and an ejection fraction of 50%. I placed on drug-eluting stent placement to the RCA without complications. He reports no recurrent chest pain since then.    His biggest complaint seems to be bilateral calf discomfort after walking less than half a block. This forces him to stop for a few minutes before he can resume. He also has bilateral foot discomfort described as numbness mostly when he wakes up in the morning. There is usually improves with walking.   He is trying to cut down on tobacco use.   ABI was mildly reduced bilaterally with patent R SFA stent. The left SFA stent was occluded with significant stenosis in proximal right common femoral artery.     No Known Allergies      Current Outpatient Prescriptions on File Prior to Visit   Medication  Sig  Dispense  Refill   .  albuterol (PROVENTIL HFA;VENTOLIN HFA) 108 (90 BASE) MCG/ACT inhaler  Inhale 2 puffs into the lungs every 6 (six) hours as needed for wheezing or shortness of breath.   1 Inhaler   2   .  aspirin 81 MG tablet  Take 81 mg by mouth daily.         [name]  atorvastatin (LIPITOR) 40 MG tablet  Take 40 mg by mouth daily.         .  clopidogrel (PLAVIX) 75 MG tablet  Take 75 mg by mouth daily.          .  furosemide (LASIX) 20 MG tablet  Take 1 tablet (20 mg total) by mouth daily.   30 tablet   3   .  gabapentin (NEURONTIN) 800 MG tablet  Take 800 mg by mouth 3 (three) times daily.         [name]  glipiZIDE (GLUCOTROL) 10 MG tablet  Take 20 mg by mouth 2 (two) times daily before a meal.         .  isosorbide mononitrate (IMDUR) 60 MG 24 hr tablet  Take 60 mg by mouth daily.         [name]  lisinopril (PRINIVIL,ZESTRIL) 10 MG tablet  Take 10 mg by mouth daily.         .  metformin (FORTAMET) 1000 MG (OSM) 24 hr tablet  Take 1,000 mg by mouth 2 (two) times daily with a meal.         .  metoprolol tartrate (LOPRESSOR) 25 MG tablet  Take 25 mg by mouth daily.          .  nitroGLYCERIN (NITROSTAT) 0.4 MG SL tablet  Place 0.4 mg under the tongue every 5 (five) minutes as needed for chest pain.             No current facility-administered medications on  file prior to visit.           Past Medical History   Diagnosis  Date   .  Peripheral vascular disease     .  Diabetes mellitus without complication     .  Morbid obesity     .  Syncope and collapse     .  CHF (congestive heart failure)     .  Asthma     .  Hernia     .  PAD (peripheral artery disease)         Angiography in March of 2012 showed 70-80% proximal right SFA stenosis status post self-expanding stent placement (6X60 mm). Angiography in April 2012 showed proximal left SFA occlusion status post self-expanding stent placement (7 X unkonw length). Both done by Dr. Lucky Cowboy   .  Coronary artery disease         Status post LAD PCI in 2013 at [facility]. Presented in October of 2014 to [facility] with unstable angina. Nuclear stress test showed inferior wall defect. Cardiac catheterization showed patent LAD stent with 90% stenosis in the proximal RCA. Ejection fraction was 50%. Status post PCI of the RCA with a 2.5 x 23 mm drug-eluting stent   .  Hyperlipidemia     .  Hypertension     .  Tobacco use             Past Surgical History   Procedure   Laterality  Date   .  Appendectomy       .  Peripheral arterial stent graft           x2 left/right   .  Clavicle surgery       .  Hernia repair       .  Cardiac catheterization    10/14       [facility]; X1 STENT PROXIMAL RCA   .  Cardiac catheterization    02/2010       [facility]           Family History   Problem  Relation  Age of Onset   .  Heart attack  Father     .  Heart disease  Father     .  Hypertension  Father     .  Hyperlipidemia  Father     .  Hypertension  Mother     .  Hyperlipidemia  Mother             History       Social History   .  Marital Status:  Single       Spouse Name:  N/A       Number of Children:  N/A   .  Years of Education:  N/A       Occupational History   .  Not on file.       Social History Main Topics   .  Smoking status:  Current Every Day Smoker -- 2.00 packs/day for 41 years       Types:  Cigarettes   .  Smokeless tobacco:  Not on file   .  Alcohol Use:  No   .  Drug Use:  No   .  Sexual Activity:  Not on file       Other Topics  Concern   .  Not on file       Social History Narrative   .  No narrative on file  PHYSICAL EXAM     BP 116/76  Pulse 99  Ht 5\' 6"  (1.676 m)  Wt 248 lb 4 oz (112.605 kg)  BMI 40.09 kg/m2 Constitutional: He is oriented to person, place, and time. He appears well-developed and well-nourished. No distress.   HENT: No nasal discharge.   Head: Normocephalic and atraumatic.   Eyes: Pupils are equal and round.  No discharge. Neck: Normal range of motion. Neck supple. No JVD present. No thyromegaly present.   Cardiovascular: Normal rate, regular rhythm, normal heart sounds. Exam reveals no gallop and no friction rub. No murmur heard.   Pulmonary/Chest: Effort normal and breath sounds normal. No stridor. No respiratory distress. He has no wheezes. He has no rales. He exhibits no tenderness.   Abdominal: Soft. Bowel sounds are normal. He exhibits no distension. There is no  tenderness. There is no rebound and no guarding.  Musculoskeletal: Normal range of motion. He exhibits +1 edema and no tenderness.  Neurological: He is alert and oriented to person, place, and time. Coordination normal.   Skin: Skin is warm and dry. No rash noted. He is not diaphoretic. No erythema. No pallor.  Psychiatric: He has a normal mood and affect. His behavior is normal. Judgment and thought content normal.   Femoral pulses are mildly reduced.  Distal pulses are not palpable.          ASSESSMENT AND PLAN              Coronary artery disease -    No recurrent angina. Continue medical therapy.           PAD (peripheral artery disease)    He has severe lifestyle limiting claudication affecting both calves. The left SFA stent is occluded with significant disease affecting the proximal right common femoral artery. I discussed with him different management options including observation and a walking program. However, he feels extremely limited by this which has affected his ability to perform his work. Due to that, I recommend proceeding with abdominal aortogram, lower extremity runoff and possible angioplasty. Risks, benefits and alternatives were discussed with the patient. Continue dual antiplatelet therapy. I will plan access via the right common femoral artery.         Tobacco use   I discussed with him the importance of smoking cessation. I explained to him that the long-term patency in peripheral arterial disease is significantly lower in patient who continue to smoke.     [name], MD, [facility] 06/23/2013

## 2013-06-23 NOTE — CV Procedure (Signed)
PERIPHERAL VASCULAR PROCEDURE  NAME:  [name] [name]   MRN: [id number] DOB:  Dec 05, 1960   ADMIT DATE: 06/23/2013  Performing Cardiologist: [name] Primary Physician: Pcp Not In System   Procedures Performed:  Abdominal Aortic Angiogram with Bi-Iliofemoral Runoff  Selective right lower extremity arterial angiography  Selective left lower extremity arterial angiography   Indication(s):   Claudication   Consent: The procedure with Risks/Benefits/Alternatives and Indications was reviewed with the patient .  All questions were answered.  Medications:  Sedation:  2 mg IV Versed, 50 mcg IV Fentanyl  Contrast:  110 ml  Visipaque   Procedural details: The right groin was prepped, draped, and anesthetized with 1% lidocaine. Using modified Seldinger technique, a 5 French sheath was introduced into the right common femoral artery. A 5 Fr Short Pigtail Catheter was advanced of over a  Versicore wire into the descending Aorta to a level just above the renal arteries. A power injection of 49ml/sec contrast over 1 sec was performed for Abdominal Aortic Angiography.  The catheter was then pulled back to a level just above the Aortic bifurcation, and a second power injection was performed to evaluate the iliac arteries.  The catheter was then removed. At this time the injector was directed to the sheath SideArm and ipsilateral artery angiography was performed via power injection of 5  ml/sec contrast for a total of 30 ml.     The pigtail catheter was changed over the Versicore wire for A crossover catheter which was then pulled back the aortic bifurcation and the wire was advanced down the contralateral common iliac artery. A Glide wire was used.  The wire was then advanced to the contralateral common femoral artery, the catheter was exchanged into an end hole straight tip catheter which was advanced over the wire to the common femoral artery. Contralateral second-order lower extremity  angiography was performed via power injection of 5 ml / sec contrast for a total of 30 ml.   The patient tolerated the procedure well with no immediate complications.    Hemodynamics:  Central Aortic Pressure / Mean Aortic Pressure: 140/70  Findings:  Abdominal aorta: Minor irregularities with no evidence of aneurysm.  Left renal artery: Normal  Right renal artery: Normal  Celiac artery: Not well visualized  Superior mesenteric artery: Patent  Right common iliac artery: 50% proximal disease.  Right internal iliac artery: Normal  Right external iliac artery: 20% focal disease.  Right common femoral artery: 20% proximal disease and focal eccentric 80% distal stenosis.  Right profunda femoral artery: Normal  Right superficial femoral artery: Normal in size with diffuse 30% disease throughout its course. The stent is noted in the midsegment which is patent with mild instent restenosis.  Right popliteal artery: Normal  Three-vessel runoff below the knee with diffuse disease in the distal anterior tibial artery  Left common iliac artery:  20% proximal disease.  Left internal iliac artery: Patent  Left external iliac artery: Normal  Left common femoral artery: Minor irregularities.  Left profunda femoral artery: Large in size and provides very good collaterals to the distal SFA.  Left superficial femoral artery:  Diffusely diseased proximally with occlusion just above the previously placed stent. The SFA reconstitutes distally via collaterals from the profunda.  Left popliteal artery: Diffuse 20% disease.  2 vessel runoff. The anterior tibial artery is occluded proximally.  Conclusions: 1. No significant aortoiliac disease. 2. Significant right common femoral artery stenosis. Patent mid SFA stent with Three-vessel runoff below the knee.  3. Occluded left SFA just proximal to the previously placed stent with reconstitution distally via very good collaterals from the  profunda. There is two-vessel runoff below the knee with an occluded anterior tibial artery.  Recommendations:  I recommend continuing medical therapy and reserving revascularization for worsening symptoms.   [name], MD, [facility] 06/23/2013 9:33 AM

## 2013-07-16 ENCOUNTER — Encounter: Payer: BC Managed Care – PPO | Admitting: Cardiovascular Disease

## 2013-07-26 ENCOUNTER — Encounter: Payer: BC Managed Care – PPO | Admitting: Cardiovascular Disease

## 2013-07-30 ENCOUNTER — Encounter: Payer: BC Managed Care – PPO | Admitting: Cardiovascular Disease

## 2013-08-31 ENCOUNTER — Ambulatory Visit: Payer: Self-pay | Admitting: Family Medicine

## 2013-09-23 ENCOUNTER — Telehealth: Payer: Self-pay | Admitting: *Deleted

## 2013-09-23 NOTE — Telephone Encounter (Signed)
Left message for patient to call and reschedule his missed appt from 07/30/13

## 2014-02-06 ENCOUNTER — Emergency Department: Payer: Self-pay | Admitting: Emergency Medicine

## 2014-02-06 LAB — CBC WITH DIFFERENTIAL/PLATELET
Basophil #: 0.1 10*3/uL (ref 0.0–0.1)
Basophil %: 0.8 %
Eosinophil #: 0.1 10*3/uL (ref 0.0–0.7)
Eosinophil %: 0.9 %
HCT: 43.7 % (ref 40.0–52.0)
HGB: 14.6 g/dL (ref 13.0–18.0)
Lymphocyte #: 1.9 10*3/uL (ref 1.0–3.6)
Lymphocyte %: 18.2 %
MCH: 29.7 pg (ref 26.0–34.0)
MCHC: 33.4 g/dL (ref 32.0–36.0)
MCV: 89 fL (ref 80–100)
Monocyte #: 0.5 x10 3/mm (ref 0.2–1.0)
Monocyte %: 5 %
Neutrophil #: 7.8 10*3/uL — ABNORMAL HIGH (ref 1.4–6.5)
Neutrophil %: 75.1 %
Platelet: 259 10*3/uL (ref 150–440)
RBC: 4.91 10*6/uL (ref 4.40–5.90)
RDW: 14.1 % (ref 11.5–14.5)
WBC: 10.4 10*3/uL (ref 3.8–10.6)

## 2014-02-06 LAB — BASIC METABOLIC PANEL
Anion Gap: 11 (ref 7–16)
BUN: 7 mg/dL (ref 7–18)
Calcium, Total: 9.2 mg/dL (ref 8.5–10.1)
Chloride: 102 mmol/L (ref 98–107)
Co2: 25 mmol/L (ref 21–32)
Creatinine: 0.9 mg/dL (ref 0.60–1.30)
EGFR (African American): >60
EGFR (Non-African Amer.): >60
Glucose: 167 mg/dL — ABNORMAL HIGH (ref 65–99)
Osmolality: 277 (ref 275–301)
Potassium: 3.9 mmol/L (ref 3.5–5.1)
Sodium: 138 mmol/L (ref 136–145)

## 2014-02-06 LAB — URINALYSIS, COMPLETE
Bilirubin,UR: NEGATIVE
Blood: NEGATIVE
Glucose,UR: NEGATIVE mg/dL (ref 0–75)
Hyaline Cast: 10
Ketone: NEGATIVE
Leukocyte Esterase: NEGATIVE
Nitrite: NEGATIVE
Ph: 5 (ref 4.5–8.0)
Protein: 25
RBC,UR: <1 /HPF (ref 0–5)
Specific Gravity: 1.015 (ref 1.003–1.030)
Squamous Epithelial: 2
WBC UR: 1 /HPF (ref 0–5)

## 2014-02-06 LAB — TROPONIN I: Troponin-I: <0.02 ng/mL

## 2014-02-06 LAB — MAGNESIUM: Magnesium: 1.4 mg/dL — ABNORMAL LOW

## 2014-02-07 ENCOUNTER — Encounter: Payer: Self-pay | Admitting: Cardiovascular Disease

## 2014-02-07 ENCOUNTER — Ambulatory Visit (INDEPENDENT_AMBULATORY_CARE_PROVIDER_SITE_OTHER): Payer: BC Managed Care – PPO | Admitting: Cardiovascular Disease

## 2014-02-07 VITALS — BP 150/92 | HR 97 | Ht 67.0 in | Wt 236.0 lb

## 2014-02-07 DIAGNOSIS — I10 Essential (primary) hypertension: Secondary | ICD-10-CM

## 2014-02-07 DIAGNOSIS — R0789 Other chest pain: Secondary | ICD-10-CM

## 2014-02-07 DIAGNOSIS — I209 Angina pectoris, unspecified: Secondary | ICD-10-CM

## 2014-02-07 DIAGNOSIS — R0602 Shortness of breath: Secondary | ICD-10-CM

## 2014-02-07 DIAGNOSIS — I25119 Atherosclerotic heart disease of native coronary artery with unspecified angina pectoris: Secondary | ICD-10-CM

## 2014-02-07 DIAGNOSIS — E785 Hyperlipidemia, unspecified: Secondary | ICD-10-CM

## 2014-02-07 DIAGNOSIS — I739 Peripheral vascular disease, unspecified: Secondary | ICD-10-CM

## 2014-02-07 DIAGNOSIS — R079 Chest pain, unspecified: Secondary | ICD-10-CM

## 2014-02-07 DIAGNOSIS — I2510 Atherosclerotic heart disease of native coronary artery without angina pectoris: Secondary | ICD-10-CM

## 2014-02-07 MED ORDER — METOPROLOL TARTRATE 25 MG PO TABS: 25.0000 mg | ORAL_TABLET | Freq: Two times a day (BID) | ORAL | Status: DC

## 2014-02-07 MED ORDER — ATORVASTATIN CALCIUM 40 MG PO TABS: 40.0000 mg | ORAL_TABLET | Freq: Every day | ORAL | Status: DC

## 2014-02-07 MED ORDER — CLOPIDOGREL BISULFATE 75 MG PO TABS: 75.0000 mg | ORAL_TABLET | Freq: Every day | ORAL | Status: DC

## 2014-02-07 NOTE — Assessment & Plan Note (Signed)
I resumed atorvastatin. He will require a followup lipid and liver profile in the near future.

## 2014-02-07 NOTE — Patient Instructions (Addendum)
[place]  Your caregiver has ordered a Stress Test with nuclear imaging. The purpose of this test is to evaluate the blood supply to your heart muscle. This procedure is referred to as a "Non-Invasive Stress Test." This is because other than having an IV started in your vein, nothing is inserted or "invades" your body. Cardiac stress tests are done to find areas of poor blood flow to the heart by determining the extent of coronary artery disease (CAD). Some patients exercise on a treadmill, which naturally increases the blood flow to your heart, while others who are  unable to walk on a treadmill due to physical limitations have a pharmacologic/chemical stress agent called Lexiscan . This medicine will mimic walking on a treadmill by temporarily increasing your coronary blood flow.   Please note: these test may take anywhere between 2-4 hours to complete  PLEASE REPORT TO [place] AT THE FIRST DESK WILL DIRECT YOU WHERE TO GO  Date of Procedure:_____________09/04/15________________________  Arrival Time for Procedure:________0745 am______________________  Instructions regarding medication:   __x__ : Hold diabetes medication morning of procedure      PLEASE NOTIFY THE OFFICE AT LEAST 24 HOURS IN ADVANCE IF YOU ARE UNABLE TO KEEP YOUR APPOINTMENT.  [phone] AND  PLEASE NOTIFY NUCLEAR MEDICINE AT [facility] AT LEAST 24 HOURS IN ADVANCE IF YOU ARE UNABLE TO KEEP YOUR APPOINTMENT. [phone]  How to prepare for your Myoview test:  1. Do not eat or drink after midnight 2. No caffeine for 24 hours prior to test 3. No smoking 24 hours prior to test. 4. Your medication may be taken with water.  If your doctor stopped a medication because of this test, do not take that medication. 5. Ladies, please do not wear dresses.  Skirts or pants are appropriate. Please wear a short sleeve shirt. 6. No perfume, cologne or lotion. 7. Wear comfortable walking shoes. No  heels!   Your physician has recommended you make the following change in your medication:   Start Metoprolol 25 mg twice daily   Your Plavix and Atorvastatin have been refilled    Your physician recommends that you schedule a follow-up appointment in:  1 month with Dr. Fletcher Anon

## 2014-02-07 NOTE — Assessment & Plan Note (Signed)
Unfortunately, the patient ran out of his cardiac medications few months ago and did not get them refilled. He missed his followup appointments. I had a prolonged discussion with him about the importance of taking his medications regularly. I refilled Plavix, atorvastatin and metoprolol. His EKG does not show acute ischemic changes. However, given his episodes of chest discomfort, I requested a pharmacologic nuclear stress test given his known history of coronary artery disease and previous PCI. He is not able to exercise on a treadmill due to claudication.

## 2014-02-07 NOTE — Assessment & Plan Note (Signed)
He continues to have moderate claudication bilaterally. Continue medical therapy for now.

## 2014-02-07 NOTE — Progress Notes (Signed)
primary care physician: Dr. Calla Kicks  HPI  This is a 53 year old man who is here today for a followup visit regarding CAD and PAD.  He has known history of coronary artery disease with previous stenting of the LAD in 2013 at [facility]. He also has known history of peripheral arterial disease status post bilateral SFA stenting in 2012 by Dr. Lucky Cowboy. Other medical problems include type 2 diabetes, hypertension, hyperlipidemia and tobacco use. He presented in October, 2014 with chest pain at rest suggestive of unstable angina. He ruled out for myocardial infarction. Nuclear stress test showed fixed inferior wall defect with mildly reduced LV systolic function. I proceeded with cardiac catheterization via the right radial artery which showed patent LAD stent with 90% proximal RCA stenosis and an ejection fraction of 50%. I placed on drug-eluting stent placement to the RCA without complications.  ABI was mildly reduced bilaterally with patent R SFA stent. The left SFA stent was occluded with significant stenosis in proximal right common femoral artery.   Angiography in January of 2015 showed: 1. No significant aortoiliac disease.  2. Significant right common femoral artery stenosis. Patent mid SFA stent with Three-vessel runoff below the knee.  3. Occluded left SFA just proximal to the previously placed stent with reconstitution distally via very good collaterals from the profunda. There is two-vessel runoff below the knee with an occluded anterior tibial artery.  He was treated medically. He missed his followup appointment and ran out of his medications few months ago. He went to the emergency room at [facility] where symptoms of dizziness, fatigue and one episode of chest discomfort. He has not been feeling well the last few weeks. Labs were unremarkable with negative cardiac enzymes. His magnesium was low at 1.4. EKG showed sinus tachycardia with frequent PVCs. Chest x-ray showed no active disease.  No Known  Allergies   Current Outpatient Prescriptions on File Prior to Visit  Medication Sig Dispense Refill  . albuterol (PROVENTIL HFA;VENTOLIN HFA) 108 (90 BASE) MCG/ACT inhaler Inhale 2 puffs into the lungs every 6 (six) hours as needed for wheezing or shortness of breath.  1 Inhaler  2  . aspirin 81 MG tablet Take 81 mg by mouth daily.      [name] gabapentin (NEURONTIN) 800 MG tablet Take 800 mg by mouth 3 (three) times daily.      . meloxicam (MOBIC) 15 MG tablet Take 15 mg by mouth daily.      . metFORMIN (GLUCOPHAGE) 1000 MG tablet Take 1,000 mg by mouth 2 (two) times daily with a meal.      . nitroGLYCERIN (NITROSTAT) 0.4 MG SL tablet Place 0.4 mg under the tongue every 5 (five) minutes as needed for chest pain.       No current facility-administered medications on file prior to visit.     Past Medical History  Diagnosis Date  . Peripheral vascular disease   . Diabetes mellitus without complication   . Morbid obesity   . Syncope and collapse   . CHF (congestive heart failure)   . Asthma   . Hernia   . PAD (peripheral artery disease)     Angiography in March of 2012 showed 70-80% proximal right SFA stenosis status post self-expanding stent placement (6X60 mm). Angiography in April 2012 showed proximal left SFA occlusion status post self-expanding stent placement (7 X unkonw length). Both done by Dr. Lucky Cowboy  . Coronary artery disease     Status post LAD PCI in 2013 at [facility]. Presented  in October of 2014 to [facility] with unstable angina. Nuclear stress test showed inferior wall defect. Cardiac catheterization showed patent LAD stent with 90% stenosis in the proximal RCA. Ejection fraction was 50%. Status post PCI of the RCA with a 2.5 x 23 mm drug-eluting stent  . Hyperlipidemia   . Hypertension   . Tobacco use      Past Surgical History  Procedure Laterality Date  . Appendectomy    . Peripheral arterial stent graft      x2 left/right  . Clavicle surgery    . Hernia repair    . Cardiac  catheterization  10/14    [facility]; X1 STENT PROXIMAL RCA  . Cardiac catheterization  02/2010    [facility]     Family History  Problem Relation Age of Onset  . Heart attack Father   . Heart disease Father   . Hypertension Father   . Hyperlipidemia Father   . Hypertension Mother   . Hyperlipidemia Mother      History   Social History  . Marital Status: Single    Spouse Name: N/A    Number of Children: N/A  . Years of Education: N/A   Occupational History  . Not on file.   Social History Main Topics  . Smoking status: Current Every Day Smoker -- 2.00 packs/day for 41 years    Types: Cigarettes  . Smokeless tobacco: Not on file  . Alcohol Use: No  . Drug Use: No  . Sexual Activity: Not on file   Other Topics Concern  . Not on file   Social History Narrative  . No narrative on file      PHYSICAL EXAM   BP 150/92  Pulse 97  Ht 5\' 7"  (1.702 m)  Wt 236 lb (107.049 kg)  BMI 36.95 kg/m2 Constitutional: He is oriented to person, place, and time. He appears well-developed and well-nourished. No distress.  HENT: No nasal discharge.  Head: Normocephalic and atraumatic.  Eyes: Pupils are equal and round.  No discharge. Neck: Normal range of motion. Neck supple. No JVD present. No thyromegaly present.  Cardiovascular: Normal rate, regular rhythm, normal heart sounds. Exam reveals no gallop and no friction rub. No murmur heard.  Pulmonary/Chest: Effort normal and breath sounds normal. No stridor. No respiratory distress. He has no wheezes. He has no rales. He exhibits no tenderness.  Abdominal: Soft. Bowel sounds are normal. He exhibits no distension. There is no tenderness. There is no rebound and no guarding.  Musculoskeletal: Normal range of motion. He exhibits +1 edema and no tenderness.  Neurological: He is alert and oriented to person, place, and time. Coordination normal.  Skin: Skin is warm and dry. No rash noted. He is not diaphoretic. No erythema. No pallor.    Psychiatric: He has a normal mood and affect. His behavior is normal. Judgment and thought content normal.  Femoral pulses are mildly reduced.  Distal pulses are not palpable.   [id number]  Rhythm  -Left axis.   -  Nonspecific T-abnormality.   ABNORMAL     ASSESSMENT AND PLAN

## 2014-02-07 NOTE — Assessment & Plan Note (Signed)
Blood pressure is elevated likely due to not taking antihypertensive medications. I resumed metoprolol.

## 2014-02-11 ENCOUNTER — Ambulatory Visit: Payer: Self-pay | Admitting: Cardiovascular Disease

## 2014-02-11 ENCOUNTER — Other Ambulatory Visit: Payer: Self-pay

## 2014-02-11 DIAGNOSIS — R079 Chest pain, unspecified: Secondary | ICD-10-CM

## 2014-02-15 NOTE — Progress Notes (Signed)
LVM 9/8

## 2014-02-16 ENCOUNTER — Telehealth: Payer: Self-pay | Admitting: *Deleted

## 2014-02-16 DIAGNOSIS — R079 Chest pain, unspecified: Secondary | ICD-10-CM

## 2014-02-16 NOTE — Telephone Encounter (Signed)
[facility] Cardiac Cath Instructions   You are scheduled for a Cardiac Cath on:____09/14/15_____________________  Please arrive at _0830______am on the day of your procedure  You will need to pre-register prior to the day of your procedure.  Enter through the [employer] at [facility].  Registration is the first desk on your right.  Please take the procedure order we have given you in order to be registered appropriately  Do not eat/drink anything after midnight  Someone will need to drive you home  It is recommended someone be with you for the first 24 hours after your procedure  Wear clothes that are easy to get on/off and wear slip on shoes if possible   Medications bring a current list of all medications with you   _x__ Do not take these medications before your procedure: Metformin one day before and two days after procedure.  Day of your procedure: Arrive at the [facility] entrance.  Free valet service is available.  After entering the [facility] please check-in at the registration desk (1st desk on your right) to receive your armband. After receiving your armband someone will escort you to the cardiac cath/special procedures waiting area.  The usual length of stay after your procedure is about 2 to 3 hours.  This can vary.  If you have any questions, please call our office at [phone], or you may call the cardiac cath lab at [facility] directly at [phone]    Your physician recommends that you return for lab work in:  Please take lab orders to [facility] to have labs collected today 9/9 or tomorrow 9/10.

## 2014-02-16 NOTE — Telephone Encounter (Signed)
Informed patient that cath instructions and lab orders are at the front desk for pick up Reviewed cath instructions  Informed patient of cath date and time  Patient verbalized understanding

## 2014-02-17 ENCOUNTER — Other Ambulatory Visit: Payer: Self-pay | Admitting: Cardiovascular Disease

## 2014-02-17 ENCOUNTER — Other Ambulatory Visit: Payer: Self-pay

## 2014-02-17 DIAGNOSIS — R079 Chest pain, unspecified: Secondary | ICD-10-CM

## 2014-02-17 LAB — CBC WITH DIFFERENTIAL/PLATELET
Basophil #: 0.1 10*3/uL (ref 0.0–0.1)
Basophil %: 0.9 %
Eosinophil #: 0.1 10*3/uL (ref 0.0–0.7)
Eosinophil %: 1.2 %
HCT: 43 % (ref 40.0–52.0)
HGB: 14.5 g/dL (ref 13.0–18.0)
Lymphocyte #: 2.3 10*3/uL (ref 1.0–3.6)
Lymphocyte %: 27.4 %
MCH: 29.8 pg (ref 26.0–34.0)
MCHC: 33.6 g/dL (ref 32.0–36.0)
MCV: 89 fL (ref 80–100)
Monocyte #: 0.6 x10 3/mm (ref 0.2–1.0)
Monocyte %: 7.5 %
Neutrophil #: 5.2 10*3/uL (ref 1.4–6.5)
Neutrophil %: 63 %
Platelet: 264 10*3/uL (ref 150–440)
RBC: 4.85 10*6/uL (ref 4.40–5.90)
RDW: 14.2 % (ref 11.5–14.5)
WBC: 8.3 10*3/uL (ref 3.8–10.6)

## 2014-02-17 LAB — BASIC METABOLIC PANEL
Anion Gap: 4 — ABNORMAL LOW (ref 7–16)
BUN: 10 mg/dL (ref 7–18)
Calcium, Total: 8.5 mg/dL (ref 8.5–10.1)
Chloride: 103 mmol/L (ref 98–107)
Co2: 31 mmol/L (ref 21–32)
Creatinine: 0.79 mg/dL (ref 0.60–1.30)
EGFR (African American): >60
EGFR (Non-African Amer.): >60
Glucose: 97 mg/dL (ref 65–99)
Osmolality: 275 (ref 275–301)
Potassium: 3.9 mmol/L (ref 3.5–5.1)
Sodium: 138 mmol/L (ref 136–145)

## 2014-02-17 LAB — PROTIME-INR
INR: 0.9
Prothrombin Time: 12.5 secs (ref 11.5–14.7)

## 2014-02-18 ENCOUNTER — Telehealth: Payer: Self-pay | Admitting: *Deleted

## 2014-02-18 NOTE — Telephone Encounter (Signed)
Cath orders faxed  Cath lab confirmed rect

## 2014-02-21 ENCOUNTER — Ambulatory Visit: Payer: Self-pay | Admitting: Cardiovascular Disease

## 2014-02-21 DIAGNOSIS — I2510 Atherosclerotic heart disease of native coronary artery without angina pectoris: Secondary | ICD-10-CM

## 2014-02-21 HISTORY — PX: CARDIAC CATHETERIZATION: SHX172

## 2014-02-25 ENCOUNTER — Encounter: Payer: Self-pay | Admitting: Cardiovascular Disease

## 2014-03-02 ENCOUNTER — Encounter: Payer: Self-pay | Admitting: *Deleted

## 2014-03-03 ENCOUNTER — Encounter: Payer: Self-pay | Admitting: Cardiovascular Disease

## 2014-03-03 ENCOUNTER — Ambulatory Visit (INDEPENDENT_AMBULATORY_CARE_PROVIDER_SITE_OTHER): Payer: BC Managed Care – PPO | Admitting: Cardiovascular Disease

## 2014-03-03 VITALS — BP 122/84 | HR 73 | Ht 67.0 in | Wt 236.0 lb

## 2014-03-03 DIAGNOSIS — I10 Essential (primary) hypertension: Secondary | ICD-10-CM

## 2014-03-03 DIAGNOSIS — R079 Chest pain, unspecified: Secondary | ICD-10-CM

## 2014-03-03 DIAGNOSIS — Z720 Tobacco use: Secondary | ICD-10-CM

## 2014-03-03 DIAGNOSIS — I739 Peripheral vascular disease, unspecified: Secondary | ICD-10-CM

## 2014-03-03 DIAGNOSIS — E785 Hyperlipidemia, unspecified: Secondary | ICD-10-CM

## 2014-03-03 DIAGNOSIS — F17200 Nicotine dependence, unspecified, uncomplicated: Secondary | ICD-10-CM

## 2014-03-03 DIAGNOSIS — I2510 Atherosclerotic heart disease of native coronary artery without angina pectoris: Secondary | ICD-10-CM

## 2014-03-03 MED ORDER — CILOSTAZOL 100 MG PO TABS: ORAL_TABLET | ORAL | Status: DC

## 2014-03-03 NOTE — Assessment & Plan Note (Signed)
Recent cardiac catheterization showed patent stents with no significant restenosis. Continue medical therapy.

## 2014-03-03 NOTE — Assessment & Plan Note (Signed)
Blood pressure is well controlled on current medications. 

## 2014-03-03 NOTE — Assessment & Plan Note (Signed)
Continue treatment with atorvastatin with a target LDL of less than 70. 

## 2014-03-03 NOTE — Assessment & Plan Note (Signed)
I had a prolonged discussion with him about the importance of smoking cessation. He wants to try quitting on his own.

## 2014-03-03 NOTE — Assessment & Plan Note (Signed)
He is having bilateral calf claudication. I added Pletal 50 mg twice daily to be increased to 100 mg twice daily if tolerated. Given that his cardiac stent was one year ago, I stopped Plavix and continued aspirin to minimize the risk of bleeding. I advised him to start a walking program. Revascularization can be considered for refractory symptoms.

## 2014-03-03 NOTE — Patient Instructions (Signed)
Your physician has recommended you make the following change in your medication:  Stop Plavix  Start Pletal  50 mg twice daily for two weeks. Then 100 mg twice daily after two weeks.   Your physician wants you to follow-up in: 6 months with Dr. Fletcher Anon. You will receive a reminder letter in the mail two months in advance. If you don't receive a letter, please call our office to schedule the follow-up appointment.

## 2014-03-03 NOTE — Progress Notes (Signed)
primary care physician: Dr. Calla Kicks  HPI  This is a 53 year old man who is here today for a followup visit regarding CAD and PAD.  He has known history of coronary artery disease with previous stenting of the LAD in 2013 at [facility]. He also has known history of peripheral arterial disease status post bilateral SFA stenting in 2012 . Other medical problems include type 2 diabetes, hypertension, hyperlipidemia and tobacco use. He presented in October, 2014 with chest pain at rest suggestive of unstable angina. He ruled out for myocardial infarction. Nuclear stress test showed fixed inferior wall defect with mildly reduced LV systolic function. I proceeded with cardiac catheterization which showed patent LAD stent with 90% proximal RCA stenosis and an ejection fraction of 50%. I placed on drug-eluting stent placement to the RCA without complications.  ABI was mildly reduced bilaterally with patent R SFA stent. The left SFA stent was occluded with significant stenosis in proximal right common femoral artery.   Angiography in January of 2015 showed: 1. No significant aortoiliac disease.  2. Significant right common femoral artery stenosis. Patent mid SFA stent with Three-vessel runoff below the knee.  3. Occluded left SFA just proximal to the previously placed stent with reconstitution distally via very good collaterals from the profunda. There is two-vessel runoff below the knee with an occluded anterior tibial artery.  He was seen recently for recurrent chest pain and shortness of breath. I proceeded with cardiac catheterization which showed patent RCA stent with mild disease over all. He reports no further episodes of chest discomfort. Biggest issue for him seems to be bilateral calf claudication after walking about 100 yards.   No Known Allergies   Current Outpatient Prescriptions on File Prior to Visit  Medication Sig Dispense Refill  . albuterol (PROVENTIL HFA;VENTOLIN HFA) 108 (90 BASE) MCG/ACT  inhaler Inhale 2 puffs into the lungs every 6 (six) hours as needed for wheezing or shortness of breath.  1 Inhaler  2  . aspirin 81 MG tablet Take 81 mg by mouth daily.      [name] atorvastatin (LIPITOR) 40 MG tablet Take 1 tablet (40 mg total) by mouth daily.  30 tablet  6  . clopidogrel (PLAVIX) 75 MG tablet Take 1 tablet (75 mg total) by mouth daily.  30 tablet  6  . gabapentin (NEURONTIN) 800 MG tablet Take 800 mg by mouth 3 (three) times daily.      . meloxicam (MOBIC) 15 MG tablet Take 15 mg by mouth daily.      . metFORMIN (GLUCOPHAGE) 1000 MG tablet Take 1,000 mg by mouth 2 (two) times daily with a meal.      . metoprolol tartrate (LOPRESSOR) 25 MG tablet Take 1 tablet (25 mg total) by mouth 2 (two) times daily.  180 tablet  3  . nitroGLYCERIN (NITROSTAT) 0.4 MG SL tablet Place 0.4 mg under the tongue every 5 (five) minutes as needed for chest pain.       No current facility-administered medications on file prior to visit.     Past Medical History  Diagnosis Date  . Peripheral vascular disease   . Diabetes mellitus without complication   . Morbid obesity   . Syncope and collapse   . CHF (congestive heart failure)   . Asthma   . Hernia   . PAD (peripheral artery disease)     Angiography in March of 2012 showed 70-80% proximal right SFA stenosis status post self-expanding stent placement (6X60 mm). Angiography in April  2012 showed proximal left SFA occlusion status post self-expanding stent placement (7 X unkonw length). Both done by Dr. Lucky Cowboy  . Coronary artery disease     Status post LAD PCI in 2013 at [facility]. Presented in October of 2014 to [facility] with unstable angina. Nuclear stress test showed inferior wall defect. Cardiac catheterization showed patent LAD stent with 90% stenosis in the proximal RCA. Ejection fraction was 50%. Status post PCI of the RCA with a 2.5 x 23 mm drug-eluting stent  . Hyperlipidemia   . Hypertension   . Tobacco use      Past Surgical History  Procedure  Laterality Date  . Appendectomy    . Peripheral arterial stent graft      x2 left/right  . Clavicle surgery    . Hernia repair    . Cardiac catheterization  10/14    [facility]; X1 STENT PROXIMAL RCA  . Cardiac catheterization  02/2010    [facility]  . Cardiac catheterization  02/21/2014    [facility]     Family History  Problem Relation Age of Onset  . Heart attack Father   . Heart disease Father   . Hypertension Father   . Hyperlipidemia Father   . Hypertension Mother   . Hyperlipidemia Mother      History   Social History  . Marital Status: Single    Spouse Name: N/A    Number of Children: N/A  . Years of Education: N/A   Occupational History  . Not on file.   Social History Main Topics  . Smoking status: Current Every Day Smoker -- 2.00 packs/day for 41 years    Types: Cigarettes  . Smokeless tobacco: Not on file  . Alcohol Use: No  . Drug Use: No  . Sexual Activity: Not on file   Other Topics Concern  . Not on file   Social History Narrative  . No narrative on file      PHYSICAL EXAM   BP 122/84  Pulse 73  Ht 5\' 7"  (1.702 m)  Wt 107.049 kg (236 lb)  BMI 36.95 kg/m2 Constitutional: He is oriented to person, place, and time. He appears well-developed and well-nourished. No distress.  HENT: No nasal discharge.  Head: Normocephalic and atraumatic.  Eyes: Pupils are equal and round.  No discharge. Neck: Normal range of motion. Neck supple. No JVD present. No thyromegaly present.  Cardiovascular: Normal rate, regular rhythm, normal heart sounds. Exam reveals no gallop and no friction rub. No murmur heard.  Pulmonary/Chest: Effort normal and breath sounds normal. No stridor. No respiratory distress. He has no wheezes. He has no rales. He exhibits no tenderness.  Abdominal: Soft. Bowel sounds are normal. He exhibits no distension. There is no tenderness. There is no rebound and no guarding.  Musculoskeletal: Normal range of motion. He exhibits +1 edema and no  tenderness.  Neurological: He is alert and oriented to person, place, and time. Coordination normal.  Skin: Skin is warm and dry. No rash noted. He is not diaphoretic. No erythema. No pallor.  Psychiatric: He has a normal mood and affect. His behavior is normal. Judgment and thought content normal.  Femoral pulses are mildly reduced.  Distal pulses are not palpable.   [id number]  Rhythm  -Old anteroseptal infarct.   -  Nonspecific T-abnormality.   Low voltage with rightward P-axis and rotation -possible pulmonary disease.   ABNORMAL     ASSESSMENT AND PLAN

## 2014-03-07 ENCOUNTER — Emergency Department: Payer: Self-pay | Admitting: Emergency Medicine

## 2014-03-07 LAB — COMPREHENSIVE METABOLIC PANEL
Albumin: 3.9 g/dL (ref 3.4–5.0)
Alkaline Phosphatase: 105 U/L
Anion Gap: 6 — ABNORMAL LOW (ref 7–16)
BUN: 9 mg/dL (ref 7–18)
Bilirubin,Total: 0.6 mg/dL (ref 0.2–1.0)
Calcium, Total: 9.1 mg/dL (ref 8.5–10.1)
Chloride: 104 mmol/L (ref 98–107)
Co2: 29 mmol/L (ref 21–32)
Creatinine: 0.88 mg/dL (ref 0.60–1.30)
EGFR (African American): >60
EGFR (Non-African Amer.): >60
Glucose: 134 mg/dL — ABNORMAL HIGH (ref 65–99)
Osmolality: 278 (ref 275–301)
Potassium: 3.9 mmol/L (ref 3.5–5.1)
SGOT(AST): 17 U/L (ref 15–37)
SGPT (ALT): 23 U/L
Sodium: 139 mmol/L (ref 136–145)
Total Protein: 7.3 g/dL (ref 6.4–8.2)

## 2014-03-07 LAB — CBC
HCT: 43.2 % (ref 40.0–52.0)
HGB: 14.6 g/dL (ref 13.0–18.0)
MCH: 29.6 pg (ref 26.0–34.0)
MCHC: 33.7 g/dL (ref 32.0–36.0)
MCV: 88 fL (ref 80–100)
Platelet: 270 10*3/uL (ref 150–440)
RBC: 4.93 10*6/uL (ref 4.40–5.90)
RDW: 13.7 % (ref 11.5–14.5)
WBC: 10.3 10*3/uL (ref 3.8–10.6)

## 2014-03-07 LAB — LIPASE, BLOOD: Lipase: 188 U/L (ref 73–393)

## 2014-03-10 ENCOUNTER — Ambulatory Visit: Payer: BC Managed Care – PPO | Admitting: Cardiovascular Disease

## 2014-03-21 ENCOUNTER — Telehealth: Payer: Self-pay

## 2014-03-21 ENCOUNTER — Telehealth: Payer: Self-pay | Admitting: *Deleted

## 2014-03-21 NOTE — Telephone Encounter (Signed)
Called regarding clearance for hernia surgery.

## 2014-03-21 NOTE — Telephone Encounter (Signed)
Error

## 2014-03-21 NOTE — Telephone Encounter (Signed)
Faxed cardiac clearance to [facility]

## 2014-03-22 ENCOUNTER — Telehealth: Payer: Self-pay | Admitting: Cardiovascular Disease

## 2014-03-22 NOTE — Telephone Encounter (Signed)
Calling if we can fax over clearance, yesterday the  fax wasn't working, was hoping to get them today to [facility] please

## 2014-03-22 NOTE — Telephone Encounter (Signed)
Cardiac clearance faxed as requested

## 2014-03-23 ENCOUNTER — Encounter: Payer: Self-pay | Admitting: *Deleted

## 2014-03-24 ENCOUNTER — Ambulatory Visit: Payer: Self-pay | Admitting: Anesthesiology

## 2014-04-01 ENCOUNTER — Ambulatory Visit: Payer: Self-pay | Admitting: Cardiovascular Disease

## 2014-04-06 ENCOUNTER — Emergency Department: Payer: Self-pay | Admitting: Emergency Medicine

## 2014-04-06 NOTE — Telephone Encounter (Signed)
This encounter was created in error - please disregard.

## 2014-04-08 ENCOUNTER — Emergency Department: Payer: Self-pay | Admitting: Emergency Medicine

## 2014-04-08 LAB — TROPONIN I: Troponin-I: <0.02 ng/mL

## 2014-04-08 LAB — CBC
HCT: 44.9 % (ref 40.0–52.0)
HGB: 15 g/dL (ref 13.0–18.0)
MCH: 29.6 pg (ref 26.0–34.0)
MCHC: 33.4 g/dL (ref 32.0–36.0)
MCV: 89 fL (ref 80–100)
Platelet: 290 10*3/uL (ref 150–440)
RBC: 5.06 10*6/uL (ref 4.40–5.90)
RDW: 14 % (ref 11.5–14.5)
WBC: 12.3 10*3/uL — ABNORMAL HIGH (ref 3.8–10.6)

## 2014-04-08 LAB — BASIC METABOLIC PANEL
Anion Gap: 9 (ref 7–16)
BUN: 9 mg/dL (ref 7–18)
Calcium, Total: 8.8 mg/dL (ref 8.5–10.1)
Chloride: 103 mmol/L (ref 98–107)
Co2: 25 mmol/L (ref 21–32)
Creatinine: 1.05 mg/dL (ref 0.60–1.30)
EGFR (African American): >60
EGFR (Non-African Amer.): >60
Glucose: 296 mg/dL — ABNORMAL HIGH (ref 65–99)
Osmolality: 283 (ref 275–301)
Potassium: 3.9 mmol/L (ref 3.5–5.1)
Sodium: 137 mmol/L (ref 136–145)

## 2014-04-20 ENCOUNTER — Telehealth: Payer: Self-pay | Admitting: Cardiovascular Disease

## 2014-04-20 NOTE — Telephone Encounter (Signed)
[facility] calling to ask if we can re fax the clearance letter for patient. They never got the last one.   Fax: [phone]

## 2014-04-20 NOTE — Telephone Encounter (Signed)
[facility] to refax form

## 2014-04-28 ENCOUNTER — Telehealth: Payer: Self-pay | Admitting: *Deleted

## 2014-04-28 NOTE — Telephone Encounter (Signed)
Faxed cardiac clearance to [facility] to stop pletal 2 days before surgery and resume after

## 2014-05-19 ENCOUNTER — Encounter (HOSPITAL_COMMUNITY): Payer: Self-pay | Admitting: Cardiovascular Disease

## 2014-06-08 ENCOUNTER — Ambulatory Visit: Payer: Self-pay | Admitting: Surgery

## 2014-06-08 DIAGNOSIS — E119 Type 2 diabetes mellitus without complications: Secondary | ICD-10-CM

## 2014-06-08 DIAGNOSIS — I10 Essential (primary) hypertension: Secondary | ICD-10-CM

## 2014-06-08 LAB — BASIC METABOLIC PANEL
Anion Gap: 8 (ref 7–16)
BUN: 9 mg/dL (ref 7–18)
Calcium, Total: 9.2 mg/dL (ref 8.5–10.1)
Chloride: 101 mmol/L (ref 98–107)
Co2: 27 mmol/L (ref 21–32)
Creatinine: 1.08 mg/dL (ref 0.60–1.30)
EGFR (African American): >60
EGFR (Non-African Amer.): >60
Glucose: 415 mg/dL — ABNORMAL HIGH (ref 65–99)
Osmolality: 288 (ref 275–301)
Potassium: 4.3 mmol/L (ref 3.5–5.1)
Sodium: 136 mmol/L (ref 136–145)

## 2014-06-08 LAB — CBC WITH DIFFERENTIAL/PLATELET
Basophil #: 0 10*3/uL (ref 0.0–0.1)
Basophil %: 0.4 %
Eosinophil #: 0 10*3/uL (ref 0.0–0.7)
Eosinophil %: 0.1 %
HCT: 44.5 % (ref 40.0–52.0)
HGB: 15 g/dL (ref 13.0–18.0)
Lymphocyte #: 1 10*3/uL (ref 1.0–3.6)
Lymphocyte %: 9.5 %
MCH: 29.6 pg (ref 26.0–34.0)
MCHC: 33.7 g/dL (ref 32.0–36.0)
MCV: 88 fL (ref 80–100)
Monocyte #: 0.4 x10 3/mm (ref 0.2–1.0)
Monocyte %: 4 %
Neutrophil #: 9.1 10*3/uL — ABNORMAL HIGH (ref 1.4–6.5)
Neutrophil %: 86 %
Platelet: 261 10*3/uL (ref 150–440)
RBC: 5.08 10*6/uL (ref 4.40–5.90)
RDW: 14.1 % (ref 11.5–14.5)
WBC: 10.6 10*3/uL (ref 3.8–10.6)

## 2014-06-16 ENCOUNTER — Ambulatory Visit: Payer: Self-pay | Admitting: Surgery

## 2014-09-30 NOTE — Discharge Summary (Signed)
PATIENT NAME:  [name] [name], [name] [name] MR#:  [id number] DATE OF BIRTH:  January 20, 1961  DATE OF ADMISSION:  04/04/2013 DATE OF DISCHARGE:  04/07/2013  PRESENTING COMPLAINT: Chest pain.   DISCHARGE DIAGNOSES: 1.  Unstable angina with cardiac catheterization and drug-eluting stent placement in right coronary artery.  2.  History of coronary artery disease status post stent in the past.  3.  Hypertension.  4.  Tobacco abuse.  5.  Peripheral vascular disease.  6.  Hypertension.  7.  Type 2 diabetes.   PROCEDURES: Cardiac cath 04/06/2013 with a drug-eluting stent and 90% RCA, by Dr. Fletcher Anon.   CODE STATUS: FULL CODE.   DISCHARGE MEDICATIONS: 1.  Aspirin 81 mg daily.  2.  Gabapentin 800 mg 1 tablet 3 times a day.  3.  Actos 15 mg 2 tablets b.i.d.  4.  Lisinopril 10 mg daily.  5.  Nitroglycerin 0.4 mg sublingual as needed.  6.  Metformin 1000 mg b.i.d. The patient is recommended to start from 04/09/2013.  7.  Atorvastatin 40 mg daily.  8.  Plavix 75 mg daily.  9.  Metoprolol 25 mg b.i.d.   DISCHARGE DIET: Low-sodium, low-fat, low-cholesterol. Carbohydrate controlled diet.  DISCHARGE FOLLOWUP:  1.  Follow up with Dr. Fletcher Anon in 1 to 2 weeks ago.  2.  Follow up with Dr. Calla Kicks at [facility] in 2 to 4 weeks.  LABORATORY DATA: Basic metabolic panel within normal limits, except glucose of 161.   BRIEF SUMMARY OF HOSPITAL COURSE: Mr. [name] is a pleasant 54 year old obese Caucasian gentleman with history of CAD status post PCI and stenting in [place] about a year ago, PVD status post stenting in the past, type 2 diabetes and ongoing tobacco abuse who presents with chest pain. He was admitted with:  1.  Unstable angina. He underwent Myoview stress test which was a moderate risk scan. Given his risk factors, he underwent cardiac cath which was done by Dr. Fletcher Anon and a drug-eluting stent was placed in 90% RCA. He was continued on aspirin, Plavix, metoprolol, statin and nitro. He remained chest  pain-free thereafter. The patient will follow up with Dr. Fletcher Anon as outpatient.  2.  Hypertension. The patient was continued on metoprolol and lisinopril.  3.  Type 2 diabetes. Actos was started, sliding scale was continued. Metformin was held due to cardiac cath and will be resumed from 04/09/2013. Hemoglobin A1c is 9.8. Dietary changes and in lifestyle were explained to the patient.  4.  Hyperlipidemia. Increased dose of statin. Weight loss and dietary compliance were discussed.  5.  Tobacco abuse disorder. Counseled smoking cessation. The patient is agreeable to it.   Hospital stay otherwise remained stable. The patient remained a FULL CODE.   TIME SPENT: 40 minutes. ____________________________ Hart Rochester Posey Pronto, MD sap:sb D: 04/08/2013 08:33:48 ET T: 04/08/2013 09:06:20 ET JOB#: [id number]  cc: Cathyrn Deas A. Posey Pronto, MD, <Dictator> Muhammad A. Fletcher Anon, MD Dory Horn Eliberto Ivory, MD Ilda Basset MD ELECTRONICALLY SIGNED 04/15/2013 13:26

## 2014-09-30 NOTE — H&P (Signed)
PATIENT NAME:  [name] [name], [name] [name] MR#:  [id number] DATE OF BIRTH:  1960/08/31  DATE OF ADMISSION:  04/03/2013  REFERRING PHYSICIAN:  Dr. Owens Shark.   PRIMARY CARE PHYSICIAN:  Dr. Rogers Blocker at [facility].   CHIEF COMPLAINT:  Chest pain.   HISTORY OF PRESENT ILLNESS:  This is a 54 year old Caucasian gentleman with past medical history of coronary artery disease status post PCI and stenting in [place] approximately one year ago.  Also has history of peripheral vascular disease status post stenting, type 2 diabetes non-insulin-requiring and tobacco abuse, who is presenting with chest pain.  He had acute onset of chest pain while at rest saying this was over his left chest, described as tightness and dull pain, 4 out of 10 in intensity, radiating to his neck and left shoulder with associated shortness of breath and diaphoresis.  Symptoms lasted for one hour in total and then he presented to the Emergency Department asymptomatic.  He denies any exertional chest pain.  Denies any palpitations.  In the Emergency Department, he was given aspirin.  Initial EKG and enzymes are within normal limits.   REVIEW OF SYSTEMS:   CONSTITUTIONAL:  No fevers, fatigue or weakness or current pain.  EYES:  Denies blurred vision, double vision.  EARS, NOSE, THROAT:  Denies ear pain, hearing loss, discharge.  RESPIRATORY:  Denies cough, wheeze, hemoptysis.  Positive for shortness of breath as above.  CARDIOVASCULAR:  Chest pain as above.  Also, lower extremity edema for approximately a two day duration.  Denies palpitations or syncope.  GASTROINTESTINAL:  Denies nausea, vomiting, diarrhea, abdominal pain.  GENITOURINARY:  Denies dysuria, hematuria.  ENDOCRINE:  Denies nocturia, polyuria.  HEMATOLOGIC AND LYMPHATIC:  Denies easy bruising or bleeding.  SKIN:  Denies rashes or lesions.  MUSCULOSKELETAL:  Denies pain in neck, back, shoulder, knees, hips.  NEUROLOGIC:  Denies paralysis, paresthesias.  PSYCHIATRIC:  Denies anxiety or  depressive symptoms.  Otherwise, full review of systems performed by me is negative.   PAST MEDICAL HISTORY:  Coronary artery disease status post PCI and stenting approximately one year ago, peripheral vascular disease status post stenting, type 2 diabetes non-insulin-requiring, morbid obesity.   SOCIAL HISTORY:  Every day tobacco user, smokes 1-1/2 packs daily.  Denies alcohol or drug usage.   FAMILY HISTORY:  Positive for coronary artery disease and myocardial infarction in his father at age 19.  Also family history of diabetes and hypertension.   ALLERGIES:  No known drug allergies.   HOME MEDICATIONS:  Actos 15 mg 2 tabs by mouth twice daily, aspirin 81 mg by mouth daily, gabapentin 800 mg by mouth three times daily, metformin 1000 mg by mouth twice daily.   PHYSICAL EXAMINATION: VITAL SIGNS:  Temperature 98.4, pulse 99, respirations 20, blood pressure 154/69, saturating 95% on room air.  Weight 111.6 kg, BMI 38.6.  GENERAL:  Well-nourished, obese Caucasian gentleman, no acute distress.  HEAD:  Normocephalic, atraumatic.  EYES:  Pupils equal, round, reactive to light.  Extraocular muscles intact.  No scleral icterus.  MOUTH:  Moist mucosal membranes.  Dentition intact.  EARS, NOSE, THROAT:  Clear without exudate.  No external lesions.  NECK:  Supple.  No thyromegaly or nodules noted.  No JVD.  PULMONARY:  Clear to auscultation bilaterally without wheezes, rubs or rhonchi.  No use of accessory muscles.  Good respiratory effort.  CHEST:  Nontender to palpation.  CARDIOVASCULAR:  S1, S2, regular rate and rhythm.  No murmurs, rubs or gallops.  Trace pedal edema to the  shin bilaterally.  Pedal pulses 2+ bilaterally.  GASTROINTESTINAL:  Soft, nontender, nondistended.  No masses, obese.  No hepatosplenomegaly.  Positive bowel sounds.  MUSCULOSKELETAL:  No swelling or clubbing.  Edema as above.  Range of motion full in all extremities.  NEUROLOGIC:  Cranial nerves II through XII intact.  No  gross neurological deficits.  Sensation intact.  Reflexes intact.  SKIN:  No ulcerations, lesions, rashes or cyanosis.  Skin warm, dry.  Turgor intact.  PSYCHIATRIC:  Mood and affect within normal limits.  Awake, alert and oriented x 3.  Insight and judgment intact.   LABORATORY DATA:  EKG, normal sinus rhythm, heart rate of 98.  No ST or T abnormalities.  Sodium 136, potassium 4, chloride 101, bicarb 29, BUN 9, creatinine 0.86, glucose 199.  LFTs within normal limits.  Troponin I less than 0.02.  WBC 8.8, hemoglobin 14.6, platelets 235.   ASSESSMENT AND PLAN:  A 54 year old gentleman with history of coronary artery disease status post stenting as well as peripheral vascular disease status post stenting, diabetes, tobacco abuse presenting with chest pain.  1.  Chest pain.  TIMI score of 2.  He is already given aspirin.  I will trend cardiac enzymes.  Admit to observation to cardiac telemetry.  Give statin.  Order a stress test for this gentleman given his significant cardiac history and risk factors.  2.  Type 2 diabetes.  Hold by mouth agents.  Add insulin sliding scale.  3.  Obesity.  The patient has been counseled on weight loss.  4.  DVT prophylaxis with heparin subQ.  5.  THE PATIENT IS FULL CODE.   TIME SPENT:  45 minutes.     ____________________________ Aaron Mose. Rebekah Zackery, MD dkh:ea D: 04/04/2013 01:56:43 ET T: 04/04/2013 02:22:02 ET JOB#: [id number]  cc: Aaron Mose. Khyra Viscuso, MD, <Dictator> Maisee Vollman Woodfin Ganja MD ELECTRONICALLY SIGNED 04/04/2013 3:10

## 2014-09-30 NOTE — Consult Note (Signed)
Brief Consult Note: Diagnosis: unstable angina with moderate risk abnormal stress test.   Patient was seen by consultant.   Consult note dictated.   Discussed with Attending MD.   Comments: Recommend cardiac cath tomorrow. Unable to do today as he had full lunch.  Request cardiac records from [facility].  Electronic Signatures: [name] (MD)  (Signed 27-Oct-14 12:07)  Authored: Brief Consult Note   Last Updated: 27-Oct-14 12:07 by [name] (MD)

## 2014-09-30 NOTE — Consult Note (Signed)
PATIENT NAME:  [name] [name] MR#:  [id number] DATE OF BIRTH:  Feb 20, 1961  DATE OF CONSULTATION:  04/05/2013  REFERRING PHYSICIAN:  Dr. Tressia Miners. CONSULTING PHYSICIAN:  Farren Nelles A. Fletcher Anon, MD  PRIMARY CARE PHYSICIAN:  Dr. Eliberto Ivory.  REASON FOR CONSULTATION: Chest pain with abnormal stress test.   HISTORY OF PRESENT ILLNESS: This is a 54 year old Caucasian male with past medical history of coronary artery disease, status post PCI and stenting approximately 1 year ago at [facility]; peripheral arterial disease, status post lower extremity stenting; type 2 diabetes with poor control, hyperlipidemia and tobacco use. He presented with substernal chest tightness, which lasted for about 1 hour. He has been experiencing milder episodes of exertional tightness at work. He works as a Engineer, building services and has noticed these episodes with intense physical activities. However, yesterday, when he came back from work, he started having significant substernal tightness, which lasted for about 1 hour.  This was associated with shortness of breath, but no other symptoms. He reports that he took Plavix only for a few months after his stenting last year. He does not seem to be fully compliant with medical therapy. He is currently chest pain-free. Cardiac enzymes were negative, but third troponin was 0.02. EKG showed no acute ischemic changes. He underwent a pharmacologic nuclear stress test today, which showed evidence of inferior wall scar with mildly reduced LV systolic function.   PAST MEDICAL HISTORY: 1.  Coronary artery disease as outlined above.  2.  Peripheral arterial disease.  3.  Type 2 diabetes.  4.  Obesity.  5.  Tobacco use.  6.  Hyperlipidemia.   SOCIAL HISTORY: Remarkable for smoking 1-1/2 packs per day since he was 54 years old. He drinks alcohol occasionally, but denies any recreational drug use.   FAMILY HISTORY: Remarkable for coronary artery disease, diabetes and hypertension.   ALLERGIES: No  known drug allergies.   HOME MEDICATIONS: Include:  1.  Actos 15 mg 2 tablets twice daily.  2.  Aspirin 81 mg daily. 3.  Gabapentin 800 mg 3 times daily.  4.  Metformin 1000 mg twice daily.   REVIEW OF SYSTEMS: Current 10-point review of systems was performed. It is negative other than what is mentioned in the HPI.   PHYSICAL EXAMINATION: GENERAL: The patient appears to be at his stated age, in no acute distress.  VITAL SIGNS: Temperature is 97.9. Pulse is 91. Respiratory rate is 18. Blood pressure is 157/95. Oxygen saturation is 95% on 2 liters nasal cannula.  HEENT: Normocephalic, atraumatic.  NECK: No JVD or carotid bruits.  RESPIRATORY: Normal respiratory effort with no use of accessory muscles. Auscultation reveals normal breath sounds.  CARDIOVASCULAR: Normal PMI. Normal S1 and S2 with no gallops or murmurs.  ABDOMEN: Benign, nontender, nondistended.  EXTREMITIES: With no clubbing, cyanosis or edema.  SKIN: Warm and dry with no rash.  PSYCHIATRIC: He is alert, oriented x 3 with normal mood and affect.   LABORATORY AND DIAGNOSTIC DATA: Creatinine was 0.86. CK-MB was 6.6 with a troponin of 0.03. Hemoglobin was 14.6. EKG showed sinus rhythm with poor R wave progression in the precordial leads.   IMPRESSION: 1.  Unstable angina with known history of coronary artery disease.  2.  Peripheral arterial disease.  3.  Uncontrolled diabetes.  4.  Hypertension.  5.  Tobacco use.   RECOMMENDATIONS: The patient's prolonged episode of substernal chest tightness at rest is  highly worrisome for unstable angina with known history of coronary artery disease and previous stenting  last year. Unfortunately, he has multiple uncontrolled risk factors. He underwent a pharmacologic nuclear stress test, which showed evidence of inferior wall scar with mildly reduced LV systolic function. Due to his symptoms and findings on stress test, I recommend proceeding with cardiac catheterization and possible  coronary intervention. Risks, benefits and alternatives were discussed with him. I had a prolonged discussion with him about the importance of compliance with medications, as well as tobacco use cessation. I will plan on doing the catheterization tomorrow.   ____________________________ Mertie Clause Fletcher Anon, MD maa:dmm D: 04/05/2013 12:13:00 ET T: 04/05/2013 13:19:17 ET JOB#: [id number]  cc: Briann Sarchet A. Fletcher Anon, MD, <Dictator> Wellington Hampshire MD ELECTRONICALLY SIGNED 04/17/2013 21:24

## 2014-10-01 NOTE — Consult Note (Signed)
PATIENT NAME:  [name] [name], [name] [name] MR#:  [id number] DATE OF BIRTH:  1961/02/02  DATE OF CONSULTATION:  02/06/2014  REFERRING PHYSICIAN:   Larae Grooms MD  CONSULTING PHYSICIAN:  Belia Heman. Verdell Carmine, MD  PRIMARY CARE PHYSICIAN: Vickie F. Ingledue, MD  CHIEF COMPLAINT: Presyncope and weakness.   HISTORY OF PRESENT ILLNESS: This is a 54 year old male who presents to the Emergency Room due to a presyncopal episode and also feeling generally weak and having some joint pains. The patient was at his work when he developed the above symptoms. He truly never really passed out, he had no chest pain, he had no palpitations, he had no nausea, no vomiting, no headache, no other associated symptoms. The patient works with Energy [employer] and is on his feet and working with his arms quite a bit. He does have a history of arthritis and developed some joint pains and generalized weakness. The patient presented to the ER here, was noted to have an EKG which showed a few PVCs. Given his previous cardiac history, hospitalist services were contacted for possible admission.   REVIEW OF SYSTEMS:  CONSTITUTIONAL: No documented fever. No weight gain. No weight loss.  EYES: No blurry or double vision.  EARS, NOSE AND THROAT: No tinnitus. No postnasal drip. No redness of the oropharynx.  RESPIRATORY: No cough. No wheeze or hemoptysis. No dyspnea.  CARDIOVASCULAR: No chest pain. No orthopnea. No palpitations. Positive presyncope.  GASTROINTESTINAL: No nausea. No vomiting or diarrhea. No abdominal pain. No melena. No hematochezia.  GENITOURINARY: No dysuria. No hematuria.  ENDOCRINE: No polyuria, nocturia, heat or cold intolerance.  HEMATOLOGIC: No anemia. No bruising. No bleeding.  INTEGUMENTARY: No rashes. No lesions.  MUSCULOSKELETAL: No arthritis. No swelling. No gout.  NEUROLOGIC: No numbness or tingling. No ataxia. No seizure-type activity.  PSYCHIATRIC: No anxiety. No insomnia. No ADD.   PAST MEDICAL HISTORY:  Consistent with history of coronary artery disease, status post stent placement, diabetes, diabetic neuropathy, hypertension, hyperlipidemia, history of ongoing tobacco abuse.   ALLERGIES: No known drug allergies.   SOCIAL HISTORY: Still smokes a pack per day, has smoked for the past 40+ years. No alcohol abuse. No illicit drug abuse. Lives with his girlfriend.   FAMILY HISTORY: Mother and father are both deceased. Mother died from breast cancer. Father also had heart disease.   CURRENT MEDICATIONS: The patient apparently is not compliant with all his medications. He can only recall these few that he is currently taking which include aspirin 81 mg daily, gabapentin 800 mg q.i.d., Lantus 30 units b.i.d. and metformin 1000 mg b.i.d.   PHYSICAL EXAMINATION:  VITAL SIGNS: Temperature is 98.2, pulse 104, respirations 18, blood pressure 108/54, saturation is 96% on room air.  GENERAL: He is a pleasant-appearing male, no apparent distress.  HEENT: Atraumatic, normocephalic. Extraocular muscles are intact. Pupils equal, round and reactive to light. Sclerae anicteric. No conjunctival injection. No pharyngeal erythema.  NECK: Supple. No jugular venous distention. No bruits, lymphadenopathy or thyromegaly.  HEART: Regular rate and rhythm. No murmurs, no rubs, no clicks.  LUNGS: Clear to auscultation bilaterally. No rales or rhonchi. No wheezes.  ABDOMEN: Soft, flat, nontender, nondistended. Has good bowel sounds. No hepatosplenomegaly appreciated.  EXTREMITIES: No evidence of any cyanosis, clubbing or peripheral edema. Has +2 pedal and radial pulses bilaterally.  NEUROLOGIC: The patient is alert, awake and oriented x 3 with no focal motor or sensory deficits appreciated bilaterally.  SKIN: Moist and warm with no rashes appreciated.  LYMPHATIC: There is no cervical or  axillary lymphadenopathy.   LABORATORY DATA: Serum glucose of 167, BUN 7, creatinine 0.9, sodium 138, potassium 3.9, chloride 102,  bicarbonate 25, magnesium 1.4. Troponin less than 0.02. White cell count 10.4, hemoglobin 14.6, hematocrit 43.7, platelet count 259,000. Urinalysis within normal limits.   IMAGING: The patient did have a chest x-ray done which showed no acute cardiopulmonary disease.   ASSESSMENT AND PLAN: This is a 54 year old male with history of coronary artery disease, status post stent, diabetes, diabetic neuropathy, hyperlipidemia, tobacco abuse who presents to the hospital with generalized weakness and a presyncopal episode and noted to be hypomagnesemic.  1.  Presyncope. The exact etiology of this is unclear. I do not appreciate any evidence of acute arrhythmia and the patient had no symptoms like palpitations or chest pain. His orthostatic vital signs are negative. He is clinically asymptomatic. For now, I will discharge him home. He is a high risk patient as he is noncompliant with his medications. At this time, I discussed the case with Dr. Fletcher Anon on the phone and he plans on following up with the patient as an outpatient in the next 1-2 days to get a Holter monitor and possibly an echocardiogram and an outpatient stress test. The patient was also strongly advised to be compliant with his cardiac medications as mentioned.  2.  History of coronary artery disease, status post stent. The patient is currently chest pain free. His EKG shows no acute ST changes except a few PVCs. He is noncompliant with his cardiac medications as mentioned. He will follow up with cardiology next week.  3.  Diabetes. He will continue his Lantus and metformin.  4.  Hypertension. The patient is hemodynamically stable. He was advised to resume his beta blockers.  5.  Diabetic neuropathy. He will continue his Neurontin.  6.  Hypomagnesemia. This has been replaced in the Emergency Room. This was likely the cause of the patient's PVCs seen on the EKG.  DISPOSITION: The patient is going to be discharged home.   TIME SPENT ON THE CONSULT:  Fifty minutes.   ____________________________ Belia Heman. Verdell Carmine, MD vjs:TT D: 02/06/2014 19:21:28 ET T: 02/06/2014 21:43:52 ET JOB#: [id number]  cc: Belia Heman. Verdell Carmine, MD, <Dictator> Henreitta Leber MD ELECTRONICALLY SIGNED 02/16/2014 15:57

## 2014-10-01 NOTE — Consult Note (Signed)
Brief Consult Note: Diagnosis: 1. Pre-syncope 2. hx of CAD s/p stent 3. DM 4. Tobacco abuse 5. HTN 6. Hyperlipidemia 7. DM Neuropathy. 8. Hypomagnesemia.   Patient was seen by consultant.   Consult note dictated.   Discussed with Attending MD.   Comments: 54 yo male w/ hx of CAD s/p stent, DM, DM Neuropathy, Hyperlipidemia, Tobacco abuse who presented to the hospital w/ generalized weakness and pre-syncopal episode and noted to be hypomagnasemic.    1. Pre-syncope - etiology unclear.  No evidence of acute arrhythmia and pt. had no palpitations.  - Orthostatics (-).  Clinicaly asymptomatic and no chest pain.  - will d/c home and follow up w/ Cards to get outpatient holter monitor, Echo, Stress test.   2. hx of CAD s/p stent - currently no chest pain.  ECG w/ no acute ST changes.  - pt. is non-compliant w/ his meds.  Follow up w/ Cards next week.   3. DM - cont. Lantus, Metformin.   4. HTN - hemodynamically stable. cont. B-blockers.   5. DM Neuropathy - cont. home meds.   6. Hypomagnsemia - replaced in the ER and likely cause of pt's PVC's on ECG.    D/c home.    JOb # [id number].  Electronic Signatures: Henreitta Leber (MD)  (Signed 30-Aug-15 19:21)  Authored: Brief Consult Note   Last Updated: 30-Aug-15 19:21 by Henreitta Leber (MD)

## 2014-10-09 NOTE — Op Note (Signed)
PATIENT NAME:  [name] [name], DISCHLER MR#:  [id number] DATE OF BIRTH:  1960/06/15  DATE OF PROCEDURE:  06/16/2014  PREOPERATIVE DIAGNOSIS: Recurrent incarcerated ventral hernia.   POSTOPERATIVE DIAGNOSIS: Recurrent incarcerated ventral hernia.  PROCEDURE PERFORMED: Ventral hernia repair with 4 cm Ventralex mesh.   ANESTHESIA: General.   ESTIMATED BLOOD LOSS: 10 mL.   COMPLICATIONS: None.   SPECIMENS: None.   INDICATION FOR SURGERY: [name] [name] is a pleasant male who had a history of multiple abdominal hernia repairs who presents with a bulge. He had a CT scan which showed a recurrent hernia lateral to his previous hernia repair. He was brought to the operating room for ventral hernia repair.   DETAILS OF PROCEDURE: As follows: Informed consent informed consent was obtained. [name] [name] is brought to the operating room suite. He was induced. Endotracheal tube was placed, general anesthesia was administered. His abdomen was prepped and draped in standard surgical fashion. A timeout was then performed correctly identifying the patient name, operative site and procedure to be performed. A transitional incision was made. There was a hernia lateral to his previous repair. The sutures were seen at the site. The hernia defect was dissected out. It was slightly opened to fit a 4 cm piece of Ventralex mesh. The hernia defect was closed transversely and the mesh was incorporated using 0 Ethibond interrupted sutures. After happy with the closure, the wound was closed in layers, a deep dermal 3-0 Vicryl running suture and a 4-0 Monocryl subcuticular. Steri-Strips, Telfa gauze and Tegaderm were then used to complete the dressing. The patient was then awoken, extubated and brought to the postanesthesia care unit. There were no immediate complications. Needle, sponge and instrument counts were correct at the end of the procedure.    ____________________________ Glena Norfolk. Sonu Kruckenberg, MD cal:TT D: 06/20/2014 19:44:13  ET T: 06/20/2014 22:46:31 ET JOB#: [id number]  cc: Harrell Gave A. Keaton Stirewalt, MD, <Dictator> Floyde Parkins MD ELECTRONICALLY SIGNED 06/21/2014 19:54

## 2014-11-14 ENCOUNTER — Telehealth: Payer: Self-pay | Admitting: Cardiovascular Disease

## 2014-11-14 NOTE — Telephone Encounter (Signed)
Pt c/o of Chest Pain: STAT if CP now or developed within 24 hours  1. Are you having CP right now? No  2. Are you experiencing any other symptoms (ex. SOB, nausea, vomiting, sweating)? SOB  3. How long have you been experiencing CP? Yesterday (Sunday) midday   4. Is your CP continuous or coming and going? Comes and goes Sharpe pain last 2 - 3 minutes   Left side of chest isolated no radiating pain   5. Have you taken Nitroglycerin? No    ?

## 2014-11-14 NOTE — Telephone Encounter (Signed)
S/w pt who indicates he had chest pain three times yesterday lasting 2-3 minutes each time.  Today he has had chest pain twice States pain went away on its own. Did not take nitroglycerin Denies N/V/diaphoresis or radiating pain Pt is overdue to 6 month follow up Per Ignacia Bayley, NP, he will see patient Thursday 6/9 Instructed patient that if chest pain becomes worse, unrelieved by nitro or he experiences radiating arm pain, N/V/ or diaphoresis, go to ER. Pt verbalized understanding and stated he would be at appt Thursday.

## 2014-11-17 ENCOUNTER — Ambulatory Visit (INDEPENDENT_AMBULATORY_CARE_PROVIDER_SITE_OTHER): Payer: BLUE CROSS/BLUE SHIELD | Admitting: Nurse Practitioner

## 2014-11-17 ENCOUNTER — Encounter: Payer: Self-pay | Admitting: Nurse Practitioner

## 2014-11-17 VITALS — BP 130/90 | HR 94 | Ht 67.0 in | Wt 242.2 lb

## 2014-11-17 DIAGNOSIS — I10 Essential (primary) hypertension: Secondary | ICD-10-CM

## 2014-11-17 DIAGNOSIS — E785 Hyperlipidemia, unspecified: Secondary | ICD-10-CM | POA: Diagnosis not present

## 2014-11-17 DIAGNOSIS — Z720 Tobacco use: Secondary | ICD-10-CM | POA: Diagnosis not present

## 2014-11-17 DIAGNOSIS — I25709 Atherosclerosis of coronary artery bypass graft(s), unspecified, with unspecified angina pectoris: Secondary | ICD-10-CM

## 2014-11-17 DIAGNOSIS — R0789 Other chest pain: Secondary | ICD-10-CM

## 2014-11-17 DIAGNOSIS — R072 Precordial pain: Secondary | ICD-10-CM

## 2014-11-17 DIAGNOSIS — R079 Chest pain, unspecified: Secondary | ICD-10-CM

## 2014-11-17 NOTE — Patient Instructions (Addendum)
Medication Instructions:  Your physician recommends that you continue on your current medications as directed. Please refer to the Current Medication list given to you today.   Labwork: None  Testing/Procedures: Your physician has requested that you have a lexiscan myoview. [place]  Your caregiver has ordered a Stress Test with nuclear imaging. The purpose of this test is to evaluate the blood supply to your heart muscle. This procedure is referred to as a "Non-Invasive Stress Test." This is because other than having an IV started in your vein, nothing is inserted or "invades" your body. Cardiac stress tests are done to find areas of poor blood flow to the heart by determining the extent of coronary artery disease (CAD). Some patients exercise on a treadmill, which naturally increases the blood flow to your heart, while others who are  unable to walk on a treadmill due to physical limitations have a pharmacologic/chemical stress agent called Lexiscan . This medicine will mimic walking on a treadmill by temporarily increasing your coronary blood flow.   Please note: these test may take anywhere between 2-4 hours to complete  PLEASE REPORT TO [place] AT THE FIRST DESK WILL DIRECT YOU WHERE TO GO  Date of Procedure:Tuesday, June 14 at 9:30am Arrival Time for Procedure: 9:00am  Instructions regarding medication:   ___xx_ : Hold diabetes medication 24 hours before and 48 hours after  procedure  _xx___:  Hold betablocker(s) night before procedure and morning of procedure. Do not take metoprolol the night before or morning of your procedure.    PLEASE NOTIFY THE OFFICE AT LEAST 16 HOURS IN ADVANCE IF YOU ARE UNABLE TO KEEP YOUR APPOINTMENT.  [phone] AND  PLEASE NOTIFY NUCLEAR MEDICINE AT [facility] AT LEAST 24 HOURS IN ADVANCE IF YOU ARE UNABLE TO KEEP YOUR APPOINTMENT. [phone]  How to prepare for your Myoview test:  1. Do not eat or drink after  midnight 2. No caffeine for 24 hours prior to test 3. No smoking 24 hours prior to test. 4. Your medication may be taken with water.  If your doctor stopped a medication because of this test, do not take that medication. 5. Ladies, please do not wear dresses.  Skirts or pants are appropriate. Please wear a short sleeve shirt. 6. No perfume, cologne or lotion. 7. Wear comfortable walking shoes. No heels!          Follow-Up: Your physician recommends that you schedule a follow-up appointment in: one month with Dr. Fletcher Anon.    Any Other Special Instructions Will Be Listed Below (If Applicable).

## 2014-11-17 NOTE — Progress Notes (Signed)
Patient Name: [name] [name] Date of Encounter: 11/17/2014  Primary Care Provider:  Gayland Curry, MD Primary Cardiologist:  Jerilynn Mages. Fletcher Anon, MD   Chief Complaint  54 y/o male with a h/o CAD, PAD, HTN, HL, DM, Tob Abuse, and obesity who presents to clinic today after experiencing c/p x 3 on 6/7.  Past Medical History   Past Medical History  Diagnosis Date  . Peripheral vascular disease   . Diabetes mellitus without complication   . Morbid obesity   . Syncope and collapse   . Chronic diastolic CHF (congestive heart failure)     a. 02/2013 EF 50% by LV gram.  . Asthma   . Hernia   . PAD (peripheral artery disease)     a. Angiography in March of 2012 showed 70-80% proximal right SFA stenosis status post self-expanding stent placement (6X60 mm). Angiography in April 2012 showed proximal left SFA occlusion status post self-expanding stent placement (7 X unkonw length). Both done by Dr. Lucky Cowboy.  . Coronary artery disease     a. Status post LAD PCI in 2013 at [facility];  b. October 2014 - Nuclear stress test showed inferior wall defect->Cath: patent LAD stent with 90% stenosis in the proximal RCA ( 2.5 x 23 mm DES), EF 50%;  c. 02/2014 MV w/ large inferior scar->Cath: LAD 2m, LCX 33m, 20d, RCA 20p/m, patent stent->Med Rx.  [name] Hyperlipidemia   . Hypertension   . Tobacco use     a. 75+ yr hx - still smoking 1ppd, down from 2 ppd.   Past Surgical History  Procedure Laterality Date  . Appendectomy    . Peripheral arterial stent graft      x2 left/right  . Clavicle surgery    . Hernia repair    . Cardiac catheterization  10/14    [facility]; X1 STENT PROXIMAL RCA  . Cardiac catheter[facility]  02/2010    [facility]  . Cardiac catheterization  02/21/2014    [facility]  . Abdominal aortagram N/A 06/23/2013    Procedure: ABDOMINAL Maxcine Ham;  Surgeon: Wellington Hampshire, MD;  Location: [facility] CATH LAB;  Service: Cardiovascular;  Laterality: N/A;    Allergies  No Known Allergies  HPI  54 y/o male with the above complex  problem list. He has a prior h/o PAD and CAD and is s/p PCI/DES's to the LAD and RCA.  In 02/2014, he had recurrent c/p.  Stress testing was performed and revealed a large region of moderate inferior scar.  This was followed by cath, which showed patency of prev placed LAD and RCA stents.  He has been medically managed since.  He lives locally and does not routinely exercise.  He continues to smoke 1 ppd of cigarettes (down from 2 ppd).  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  He was in his USOH until 6/7, when he had three separate episodes of sharp, midsternal chest pain w/o associated Ss while @ rest.  Ss lasted approx 2-3 mins and resolved spontaneously w/o intervention.  Ss were dissimilar from prior angina, which he identifies as being more of a tightness in nature.  He has gone to work each of the past 2 days, including this AM, and has been able to complete his work w/o recurrence of Ss or limitations in activities.  He does have chronic DOE, which is unchanged, but otw denies pnd, orthopnea, n, v, dizziness, syncope, edema, or early satiety.  Home Medications  Prior to Admission medications   Medication Sig Start Date End Date Taking? Authorizing Provider  albuterol (PROVENTIL HFA;VENTOLIN HFA) 108 (90 BASE) MCG/ACT inhaler Inhale 2 puffs into the lungs every 6 (six) hours as needed for wheezing or shortness of breath. 04/15/13  Yes Wellington Hampshire, MD  aspirin 81 MG tablet Take 81 mg by mouth daily.   Yes Historical Provider, MD  atorvastatin (LIPITOR) 40 MG tablet Take 1 tablet (40 mg total) by mouth daily. 02/07/14  Yes Wellington Hampshire, MD  cilostazol (PLETAL) 100 MG tablet Take 50 mg (1/2 tablet)  twice daily for two weeks. Then 100 mg (1 tablet) twice daily after two weeks. 03/03/14  Yes Wellington Hampshire, MD  gabapentin (NEURONTIN) 800 MG tablet Take 800 mg by mouth 3 (three) times daily.   Yes Historical Provider, MD  meloxicam (MOBIC) 15 MG  tablet Take 15 mg by mouth daily.   Yes Historical Provider, MD  metFORMIN (GLUCOPHAGE) 1000 MG tablet Take 1,000 mg by mouth 2 (two) times daily with a meal.   Yes Historical Provider, MD  metoprolol tartrate (LOPRESSOR) 25 MG tablet Take 1 tablet (25 mg total) by mouth 2 (two) times daily. 02/07/14  Yes Wellington Hampshire, MD  nitroGLYCERIN (NITROSTAT) 0.4 MG SL tablet Place 0.4 mg under the tongue every 5 (five) minutes as needed for chest pain.   Yes Historical Provider, MD    Review of Systems  Sharp c/p and chronic DOE as outlined above.  He denies palpitations, pnd, orthopnea, n, v, dizziness, syncope, edema, weight gain, or early satiety.  All other systems reviewed and are otherwise negative except as noted above.  Physical Exam  VS:  BP 130/90 mmHg  Pulse 94  Ht 5\' 7"  ([id number] m)  Wt 242 lb 4 oz (109.884 kg)  BMI 37.93 kg/m2 , BMI Body mass index is 37.93 kg/(m^2). GEN: Well nourished, well developed, in no acute distress. HEENT: normal. Neck: Supple, obese, difficult to gauge JVP, no carotid bruits, or masses. Cardiac: RRR, no murmurs, rubs, or gallops. No clubbing, cyanosis, edema.  Radials/DP/PT 2+ and equal bilaterally.  Respiratory:  Respirations regular and unlabored, clear to auscultation bilaterally. GI: Soft, nontender, nondistended, BS + x 4. MS: no deformity or atrophy. Skin: warm and dry, no rash. Neuro:  Strength and sensation are intact. Psych: Normal affect.  Accessory Clinical Findings  ECG - rsr 94, inf infarct/left axis, lateral T flattening (I/aVL) and inversion (V5-6), which is new.  Assessment & Plan  1.  Midsternal Chest Pain/CAD:  Pt presents today after experiencing 3 separate, brief episodes of sharp left sided and midsternal chest pain, different from prior angina.  Ss occurred @ rest, lasted ~ 2-3 mins, and resolved spontaneously w/o intervention.  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   ECG notable for T flattening and inversion in lateral leads, which was not present on prior ECGs.  I will arrange for a lexiscan myoview to re-evaluate for ischemia.  He does have a h/o inferior infarct on myoviews and his last cath following a MV in 02/2014 showed patent LAD and RCA stents.  Cont ASA, bb, statin, and prn nitrate therapy.    2.  Essential HTN: Stable on bb.  With h/o DM, should likely be on ACEI.  3.  HL:  Cont stating therapy- followed by primary care.  4.  DM II:  On metformin.  Per IM.  5.  Tob Abuse:  Still smoking 1 ppd.  Complete cessation advised.  He admits to not really wanting to quit.  6.  Morbid Obesity:  We discussed the importance of reducing caloric intake while increasing caloric expenditure.  We can address this again following stress testing.  7.  Dispo:  Myoview as above.  F/U in 1 month or sooner if necessary (pending MV).   Murray Hodgkins, NP 11/17/2014, 2:04 PM

## 2014-11-22 ENCOUNTER — Encounter
Admission: RE | Admit: 2014-11-22 | Discharge: 2014-11-22 | Disposition: A | Discharge status: Home / Self Care | Payer: BLUE CROSS/BLUE SHIELD | Source: Ambulatory Visit | Attending: Nurse Practitioner | Admitting: Nurse Practitioner

## 2014-11-22 ENCOUNTER — Telehealth: Payer: Self-pay | Admitting: Cardiovascular Disease

## 2014-11-22 DIAGNOSIS — R0789 Other chest pain: Secondary | ICD-10-CM

## 2014-11-22 DIAGNOSIS — R072 Precordial pain: Secondary | ICD-10-CM | POA: Insufficient documentation

## 2014-11-22 NOTE — Telephone Encounter (Signed)
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  I will forward this message to [facility] in the [place] office to make her aware.

## 2014-11-22 NOTE — Telephone Encounter (Signed)
NeW message  [facility] @ [facility] wanted to let our office know that pt's [name] [name] was being resch to Monday 6/20 due to pt smoking before his test today.

## 2014-11-28 ENCOUNTER — Encounter
Admission: RE | Admit: 2014-11-28 | Discharge: 2014-11-28 | Disposition: A | Discharge status: Home / Self Care | Payer: BLUE CROSS/BLUE SHIELD | Source: Ambulatory Visit | Attending: Nurse Practitioner | Admitting: Nurse Practitioner

## 2014-11-28 DIAGNOSIS — R072 Precordial pain: Secondary | ICD-10-CM

## 2014-11-28 LAB — NM MYOCAR MULTI W/SPECT W/WALL MOTION / EF
LV dias vol: 136 mL
LV sys vol: 81 mL
Rest HR: 93 {beats}/min
SDS: 1
SRS: 4
SSS: 2
TID: 1.08

## 2014-11-28 MED ORDER — TECHNETIUM TC 99M SESTAMIBI - CARDIOLITE
12.1600 | Freq: Once | INTRAVENOUS | Status: AC | PRN
  Administered 2014-11-28: 12.16 via INTRAVENOUS

## 2014-11-28 MED ORDER — TECHNETIUM TC 99M SESTAMIBI - CARDIOLITE
30.0000 | Freq: Once | INTRAVENOUS | Status: AC | PRN
  Administered 2014-11-28: 31.3 via INTRAVENOUS

## 2014-11-28 MED ORDER — REGADENOSON 0.4 MG/5ML IV SOLN
0.4000 mg | Freq: Once | INTRAVENOUS | Status: AC
  Administered 2014-11-28: 0.4 mg via INTRAVENOUS
  Filled 2014-11-28: qty 5

## 2014-11-29 ENCOUNTER — Telehealth: Payer: Self-pay

## 2014-11-29 ENCOUNTER — Telehealth: Payer: Self-pay | Admitting: *Deleted

## 2014-11-29 ENCOUNTER — Other Ambulatory Visit: Payer: Self-pay

## 2014-11-29 DIAGNOSIS — R079 Chest pain, unspecified: Secondary | ICD-10-CM

## 2014-11-29 NOTE — Telephone Encounter (Signed)
Results are not available yet.  Will contact pt w/ results when test has been interpreted.

## 2014-11-29 NOTE — Telephone Encounter (Signed)
Pt girlfriend would like stress test results

## 2014-11-29 NOTE — Telephone Encounter (Signed)
lmov to schedule a echo per Ignacia Bayley.

## 2014-12-26 ENCOUNTER — Other Ambulatory Visit: Payer: BLUE CROSS/BLUE SHIELD

## 2014-12-30 ENCOUNTER — Ambulatory Visit (INDEPENDENT_AMBULATORY_CARE_PROVIDER_SITE_OTHER): Payer: BLUE CROSS/BLUE SHIELD

## 2014-12-30 ENCOUNTER — Other Ambulatory Visit: Payer: Self-pay

## 2014-12-30 DIAGNOSIS — R079 Chest pain, unspecified: Secondary | ICD-10-CM

## 2015-01-02 ENCOUNTER — Ambulatory Visit: Payer: BLUE CROSS/BLUE SHIELD | Admitting: Cardiovascular Disease

## 2015-01-27 ENCOUNTER — Encounter: Payer: Self-pay | Admitting: *Deleted

## 2015-01-27 ENCOUNTER — Ambulatory Visit: Payer: BLUE CROSS/BLUE SHIELD | Admitting: Cardiovascular Disease

## 2015-03-27 ENCOUNTER — Ambulatory Visit: Payer: BLUE CROSS/BLUE SHIELD

## 2015-03-27 ENCOUNTER — Ambulatory Visit
Admission: EM | Admit: 2015-03-27 | Discharge: 2015-03-27 | Disposition: A | Discharge status: Home / Self Care | Payer: BLUE CROSS/BLUE SHIELD | Attending: Family Medicine | Admitting: Family Medicine

## 2015-03-27 ENCOUNTER — Encounter: Payer: Self-pay | Admitting: Emergency Medicine

## 2015-03-27 DIAGNOSIS — R0781 Pleurodynia: Secondary | ICD-10-CM

## 2015-03-27 DIAGNOSIS — M792 Neuralgia and neuritis, unspecified: Secondary | ICD-10-CM | POA: Diagnosis not present

## 2015-03-27 LAB — URINALYSIS COMPLETE WITH MICROSCOPIC (ARMC ONLY)
Bacteria, UA: NONE SEEN — AB
Bilirubin Urine: NEGATIVE
Glucose, UA: 250 mg/dL — AB
Ketones, ur: NEGATIVE mg/dL
Nitrite: NEGATIVE
Protein, ur: 100 mg/dL — AB
Specific Gravity, Urine: 1.02 (ref 1.005–1.030)
pH: 6 (ref 5.0–8.0)

## 2015-03-27 MED ORDER — ORPHENADRINE CITRATE ER 100 MG PO TB12: 100.0000 mg | ORAL_TABLET | Freq: Two times a day (BID) | ORAL | Status: DC

## 2015-03-27 NOTE — ED Provider Notes (Signed)
CSN: [id number]     Arrival date & time 03/27/15  1300 History   First MD Initiated Contact with Patient 03/27/15 1439     Nurses notes were reviewed. Chief Complaint  Patient presents with  . Back Pain   (Consider location/radiation/quality/duration/timing/severity/associated sxs/prior Treatment) Patient is a 53 y.o. male presenting with back pain. The history is provided by the patient. No language interpreter was used.  Back Pain Location:  Thoracic spine Quality:  Stabbing Pain severity:  Moderate Pain is:  Unable to specify Duration:  4 days Timing:  Intermittent Progression:  Waxing and waning Context: lifting heavy objects and twisting   Context: not emotional stress, not falling, not jumping from heights, not MCA, not occupational injury, not pedestrian accident, not physical stress, not recent illness and not recent injury   Relieved by:  Nothing Ineffective treatments:  None tried Associated symptoms: numbness and tingling   Associated symptoms: no abdominal pain, no bladder incontinence and no chest pain   Risk factors: lack of exercise, obesity and vascular disease   Risk factors: no hx of cancer     Past Medical History  Diagnosis Date  . Peripheral vascular disease ([place])   . Diabetes mellitus without complication ([place])   . Morbid obesity ([place])   . Syncope and collapse   . Chronic diastolic CHF (congestive heart failure) ([place])     a. 02/2013 EF 50% by LV gram.  . Asthma   . Hernia   . PAD (peripheral artery disease) ([place])     a. Angiography in March of 2012 showed 70-80% proximal right SFA stenosis status post self-expanding stent placement (6X60 mm). Angiography in April 2012 showed proximal left SFA occlusion status post self-expanding stent placement (7 X unkonw length). Both done by Dr. Lucky Cowboy.  . Coronary artery disease     a. Status post LAD PCI in 2013 at [facility];  b. October 2014 - Nuclear stress test showed inferior wall defect->Cath: patent LAD stent with 90%  stenosis in the proximal RCA ( 2.5 x 23 mm DES), EF 50%;  c. 02/2014 MV w/ large inferior scar->Cath: LAD 55m, LCX 37m, 20d, RCA 20p/m, patent stent->Med Rx.  [name] Hyperlipidemia   . Hypertension   . Tobacco use     a. 75+ yr hx - still smoking 1ppd, down from 2 ppd.   Past Surgical History  Procedure Laterality Date  . Appendectomy    . Peripheral arterial stent graft      x2 left/right  . Clavicle surgery    . Hernia repair    . Cardiac catheterization  10/14    [facility]; X1 STENT PROXIMAL RCA  . Cardiac catheter[facility]  02/2010    [facility]  . Cardiac catheterization  02/21/2014    [facility]  . Abdominal aortagram N/A 06/23/2013    Procedure: ABDOMINAL Maxcine Ham;  Surgeon: Wellington Hampshire, MD;  Location: [facility] CATH LAB;  Service: Cardiovascular;  Laterality: N/A;   Family History  Problem Relation Age of Onset  . Heart attack Father   . Heart disease Father   . Hypertension Father   . Hyperlipidemia Father   . Hypertension Mother   . Hyperlipidemia Mother    Social History  Substance Use Topics  . Smoking status: Current Every Day Smoker -- 1.00 packs/day for 41 years    Types: Cigarettes  . Smokeless tobacco: None  . Alcohol Use: No    Review of Systems  Cardiovascular: Negative for chest pain, palpitations and leg swelling.  Gastrointestinal: Negative  for abdominal pain.  Genitourinary: Negative for bladder incontinence.  Musculoskeletal: Positive for back pain.  Neurological: Positive for tingling and numbness.  All other systems reviewed and are negative.   Allergies  Review of patient's allergies indicates no known allergies.  Home Medications   Prior to Admission medications   Medication Sig Start Date End Date Taking? Authorizing Provider  albuterol (PROVENTIL HFA;VENTOLIN HFA) 108 (90 BASE) MCG/ACT inhaler Inhale 2 puffs into the lungs every 6 (six) hours as needed for wheezing or shortness of breath. 04/15/13   Wellington Hampshire, MD  aspirin 81 MG tablet Take 81 mg by mouth  daily.    Historical Provider, MD  atorvastatin (LIPITOR) 40 MG tablet Take 1 tablet (40 mg total) by mouth daily. 02/07/14   Wellington Hampshire, MD  cilostazol (PLETAL) 100 MG tablet Take 50 mg (1/2 tablet)  twice daily for two weeks. Then 100 mg (1 tablet) twice daily after two weeks. 03/03/14   Wellington Hampshire, MD  gabapentin (NEURONTIN) 800 MG tablet Take 800 mg by mouth 3 (three) times daily.    Historical Provider, MD  meloxicam (MOBIC) 15 MG tablet Take 15 mg by mouth daily.    Historical Provider, MD  metFORMIN (GLUCOPHAGE) 1000 MG tablet Take 1,000 mg by mouth 2 (two) times daily with a meal.    Historical Provider, MD  metoprolol tartrate (LOPRESSOR) 25 MG tablet Take 1 tablet (25 mg total) by mouth 2 (two) times daily. 02/07/14   Wellington Hampshire, MD  nitroGLYCERIN (NITROSTAT) 0.4 MG SL tablet Place 0.4 mg under the tongue every 5 (five) minutes as needed for chest pain.    Historical Provider, MD  orphenadrine (NORFLEX) 100 MG tablet Take 1 tablet (100 mg total) by mouth 2 (two) times daily. 03/27/15   Frederich Cha, MD   Meds Ordered and Administered this Visit  Medications - No data to display  BP 146/85 mmHg  Pulse 88  Temp(Src) 98.4 F (36.9 C) (Tympanic)  Resp 16  Ht 5\' 6"  (1.676 m)  Wt 236 lb (107.049 kg)  BMI 38.11 kg/m2 No data found.   Physical Exam  Constitutional: He is oriented to person, place, and time. He appears well-developed and well-nourished.  Obese white male  HENT:  Head: Normocephalic.  Eyes: Pupils are equal, round, and reactive to light.  Neck: Normal range of motion. Neck supple.  Cardiovascular: Normal rate and regular rhythm.   Pulmonary/Chest: Effort normal and breath sounds normal. No respiratory distress. He has no wheezes.  Abdominal: Soft.  ptotuberent  Musculoskeletal: Normal range of motion. He exhibits no tenderness.  Neurological: He is alert and oriented to person, place, and time.  Skin: Skin is warm and dry. No rash noted. No  erythema.  Psychiatric: He has a normal mood and affect. His behavior is normal.  Vitals reviewed.   ED Course  Procedures (including critical care time)  Labs Review Labs Reviewed  URINALYSIS COMPLETEWITH MICROSCOPIC ([facility] ONLY) - Abnormal; Notable for the following:    Glucose, UA 250 (*)    Hgb urine dipstick TRACE (*)    Protein, ur 100 (*)    Leukocytes, UA TRACE (*)    Bacteria, UA NONE SEEN (*)    Squamous Epithelial / LPF 0-5 (*)    All other components within normal limits    Imaging Review Dg Ribs Unilateral W/chest Left  03/27/2015  CLINICAL DATA:  Four-day history of left rib pain. No trauma. Coughing spells. EXAM: LEFT RIBS AND  CHEST - 3+ VIEW COMPARISON:  Chest x-ray 06/08/2014 FINDINGS: The cardiac silhouette, mediastinal and hilar contours are within normal limits and stable. Aortic calcifications without aneurysm. The lungs are clear. No pleural effusion or pneumothorax. Remote surgical changes from right distal clavicle resection with spurring change. Dedicated views of the left ribs did not demonstrate any definite acute rib fractures or worrisome bone lesions. IMPRESSION: No acute cardiopulmonary findings. No definite rib fractures. Electronically Signed   By: Marijo Sanes M.D.   On: 03/27/2015 15:34     Visual Acuity Review  Right Eye Distance:   Left Eye Distance:   Bilateral Distance:    Right Eye Near:   Left Eye Near:    Bilateral Near:         MDM   1. Rib pain on left side   2. Neuropathic pain     Patient left to take his wife to doctor's visit but has not returned in about 2 hours. We'll go ahead and submit discharge papers for him. Before he left I told him that we'll place him on Norflex to see if it helps with the pain in the left side of his back I'm not sure what's accident causing this pain however it does not appear to be cardiac in comparison to his previous cardiac pain and his chest x-ray was negative for acute problems. I did  explain to him that if he started having a rash this may be pain coming from a shingle type of infection and that sometimes before shingle manifests itself with a rash aching cause a neuropathic pain. If he is not better in a few days I have also asked before see his PCP for follow-up.   Frederich Cha, MD 03/27/15 [phone]

## 2015-03-27 NOTE — ED Notes (Signed)
Patient c/o back pain off and on for the past 4 days.

## 2015-03-27 NOTE — Discharge Instructions (Signed)
Neuropathic Pain Neuropathic pain is pain caused by damage to the nerves that are responsible for certain sensations in your body (sensory nerves). The pain can be caused by damage to:   The sensory nerves that send signals to your spinal cord and brain (peripheral nervous system).  The sensory nerves in your brain or spinal cord (central nervous system). Neuropathic pain can make you more sensitive to pain. What would be a minor sensation for most people may feel very painful if you have neuropathic pain. This is usually a long-term condition that can be difficult to treat. The type of pain can differ from person to person. It may start suddenly (acute), or it may develop slowly and last for a long time (chronic). Neuropathic pain may come and go as damaged nerves heal or may stay at the same level for years. It often causes emotional distress, loss of sleep, and a lower quality of life. CAUSES  The most common cause of damage to a sensory nerve is diabetes. Many other diseases and conditions can also cause neuropathic pain. Causes of neuropathic pain can be classified as:  Toxic. Many drugs and chemicals can cause toxic damage. The most common cause of toxic neuropathic pain is damage from drug treatment for cancer (chemotherapy).  Metabolic. This type of pain can happen when a disease causes imbalances that damage nerves. Diabetes is the most common of these diseases. Vitamin B deficiency caused by long-term alcohol abuse is another common cause.  Traumatic. Any injury that cuts, crushes, or stretches a nerve can cause damage and pain. A common example is feeling pain after losing an arm or leg (phantom limb pain).  Compression-related. If a sensory nerve gets trapped or compressed for a long period of time, the blood supply to the nerve can be cut off.  Vascular. Many blood vessel diseases can cause neuropathic pain by decreasing blood supply and oxygen to nerves.  Autoimmune. This type of  pain results from diseases in which the body's defense system mistakenly attacks sensory nerves. Examples of autoimmune diseases that can cause neuropathic pain include lupus and multiple sclerosis.  Infectious. Many types of viral infections can damage sensory nerves and cause pain. Shingles infection is a common cause of this type of pain.  Inherited. Neuropathic pain can be a symptom of many diseases that are passed down through families (genetic). SIGNS AND SYMPTOMS  The main symptom is pain. Neuropathic pain is often described as:  Burning.  Shock-like.  Stinging.  Hot or cold.  Itching. DIAGNOSIS  No single test can diagnose neuropathic pain. Your health care provider will do a physical exam and ask you about your pain. You may use a pain scale to describe how bad your pain is. You may also have tests to see if you have a high sensitivity to pain and to help find the cause and location of any sensory nerve damage. These tests may include:  Imaging studies, such as:  X-rays.  CT scan.  MRI.  Nerve conduction studies to test how well nerve signals travel through your sensory nerves (electrodiagnostic testing).  Stimulating your sensory nerves through electrodes on your skin and measuring the response in your spinal cord and brain (somatosensory evoked potentials). TREATMENT  Treatment for neuropathic pain may change over time. You may need to try different treatment options or a combination of treatments. Some options include:  Over-the-counter pain relievers.  Prescription medicines. Some medicines used to treat other conditions may also help neuropathic pain. These  include medicines to:  Control seizures (anticonvulsants).  Relieve depression (antidepressants).  Prescription-strength pain relievers (narcotics). These are usually used when other pain relievers do not help.  Transcutaneous nerve stimulation (TENS). This uses electrical currents to block painful nerve  signals. The treatment is painless.  Topical and local anesthetics. These are medicines that numb the nerves. They can be injected as a nerve block or applied to the skin.  Alternative treatments, such as:  Acupuncture.  Meditation.  Massage.  Physical therapy.  Pain management programs.  Counseling. HOME CARE INSTRUCTIONS  Learn as much as you can about your condition.  Take medicines only as directed by your health care provider.  Work closely with all your health care providers to find what works best for you.  Have a good support system at home.  Consider joining a chronic pain support group. SEEK MEDICAL CARE IF:  Your pain treatments are not helping.  You are having side effects from your medicines.  You are struggling with fatigue, mood changes, depression, or anxiety.   This information is not intended to replace advice given to you by your health care provider. Make sure you discuss any questions you have with your health care provider.   Document Released: 02/22/2004 Document Revised: 06/17/2014 Document Reviewed: 11/04/2013 Elsevier Interactive Patient Education 2016 Elsevier Inc[employer]  Pain Without a Known Cause WHAT IS PAIN WITHOUT A KNOWN CAUSE? Pain can occur in any part of the body and can range from mild to severe. Sometimes no cause can be found for why you are having pain. Some types of pain that can occur without a known cause include:   Headache.  Back pain.  Abdominal pain.  Neck pain. HOW IS PAIN WITHOUT A KNOWN CAUSE DIAGNOSED?  Your health care provider will try to find the cause of your pain. This may include:  Physical exam.  Medical history.  Blood tests.  Urine tests.  X-rays. If no cause is found, your health care provider may diagnose you with pain without a known cause.  IS THERE TREATMENT FOR PAIN WITHOUT A CAUSE?  Treatment depends on the kind of pain you have. Your health care provider may prescribe medicines to help  relieve your pain.  WHAT CAN I DO AT HOME FOR MY PAIN?   Take medicines only as directed by your health care provider.  Stop any activities that cause pain. During periods of severe pain, bed rest may help.  Try to reduce your stress with activities such as yoga or meditation. Talk to your health care provider for other stress-reducing activity recommendations.  Exercise regularly, if approved by your health care provider.  Eat a healthy diet that includes fruits and vegetables. This may improve pain. Talk to your health care provider if you have any questions about your diet. WHAT IF MY PAIN DOES NOT GET BETTER?  If you have a painful condition and no reason can be found for the pain or the pain gets worse, it is important to follow up with your health care provider. It may be necessary to repeat tests and look further for a possible cause.    This information is not intended to replace advice given to you by your health care provider. Make sure you discuss any questions you have with your health care provider.   Document Released: 02/19/2001 Document Revised: 06/17/2014 Document Reviewed: 10/12/2013 Elsevier Interactive Patient Education [employer].

## 2015-04-09 ENCOUNTER — Encounter: Payer: Self-pay | Admitting: Emergency Medicine

## 2015-04-09 ENCOUNTER — Emergency Department: Payer: BLUE CROSS/BLUE SHIELD

## 2015-04-09 ENCOUNTER — Emergency Department
Admission: EM | Admit: 2015-04-09 | Discharge: 2015-04-09 | Disposition: A | Discharge status: Home / Self Care | Payer: BLUE CROSS/BLUE SHIELD | Attending: Emergency Medicine | Admitting: Emergency Medicine

## 2015-04-09 DIAGNOSIS — Z794 Long term (current) use of insulin: Secondary | ICD-10-CM | POA: Diagnosis not present

## 2015-04-09 DIAGNOSIS — Z79899 Other long term (current) drug therapy: Secondary | ICD-10-CM | POA: Insufficient documentation

## 2015-04-09 DIAGNOSIS — I10 Essential (primary) hypertension: Secondary | ICD-10-CM | POA: Diagnosis not present

## 2015-04-09 DIAGNOSIS — Z720 Tobacco use: Secondary | ICD-10-CM | POA: Insufficient documentation

## 2015-04-09 DIAGNOSIS — R42 Dizziness and giddiness: Secondary | ICD-10-CM | POA: Insufficient documentation

## 2015-04-09 DIAGNOSIS — R739 Hyperglycemia, unspecified: Secondary | ICD-10-CM

## 2015-04-09 DIAGNOSIS — E1165 Type 2 diabetes mellitus with hyperglycemia: Secondary | ICD-10-CM | POA: Diagnosis not present

## 2015-04-09 LAB — CBC WITH DIFFERENTIAL/PLATELET
Basophils Absolute: 0 10*3/uL (ref 0–0.1)
Basophils Relative: 0 %
Eosinophils Absolute: 0 10*3/uL (ref 0–0.7)
Eosinophils Relative: 0 %
HCT: 41 % (ref 40.0–52.0)
Hemoglobin: 13.9 g/dL (ref 13.0–18.0)
Lymphocytes Relative: 11 %
Lymphs Abs: 1.1 10*3/uL (ref 1.0–3.6)
MCH: 29.5 pg (ref 26.0–34.0)
MCHC: 33.9 g/dL (ref 32.0–36.0)
MCV: 87 fL (ref 80.0–100.0)
Monocytes Absolute: 0.5 10*3/uL (ref 0.2–1.0)
Monocytes Relative: 4 %
Neutro Abs: 8.9 10*3/uL — ABNORMAL HIGH (ref 1.4–6.5)
Neutrophils Relative %: 85 %
Platelets: 224 10*3/uL (ref 150–440)
RBC: 4.71 MIL/uL (ref 4.40–5.90)
RDW: 14 % (ref 11.5–14.5)
WBC: 10.6 10*3/uL (ref 3.8–10.6)

## 2015-04-09 LAB — BASIC METABOLIC PANEL
Anion gap: 7 (ref 5–15)
BUN: 11 mg/dL (ref 6–20)
CO2: 26 mmol/L (ref 22–32)
Calcium: 8.7 mg/dL — ABNORMAL LOW (ref 8.9–10.3)
Chloride: 102 mmol/L (ref 101–111)
Creatinine, Ser: 0.86 mg/dL (ref 0.61–1.24)
GFR calc Af Amer: >60 mL/min (ref >60)
GFR calc non Af Amer: >60 mL/min (ref >60)
Glucose, Bld: 343 mg/dL — ABNORMAL HIGH (ref 65–99)
Potassium: 4.1 mmol/L (ref 3.5–5.1)
Sodium: 135 mmol/L (ref 135–145)

## 2015-04-09 LAB — URINALYSIS COMPLETE WITH MICROSCOPIC (ARMC ONLY)
Bacteria, UA: NONE SEEN
Bilirubin Urine: NEGATIVE
Glucose, UA: >500 mg/dL — AB
Ketones, ur: NEGATIVE mg/dL
Leukocytes, UA: NEGATIVE
Nitrite: NEGATIVE
Protein, ur: NEGATIVE mg/dL
RBC / HPF: NONE SEEN RBC/hpf (ref 0–5)
Specific Gravity, Urine: 1.015 (ref 1.005–1.030)
pH: 6 (ref 5.0–8.0)

## 2015-04-09 LAB — GLUCOSE, CAPILLARY: Glucose-Capillary: 256 mg/dL — ABNORMAL HIGH (ref 65–99)

## 2015-04-09 LAB — TROPONIN I: Troponin I: <0.03 ng/mL (ref <0.031)

## 2015-04-09 MED ORDER — ASPIRIN 81 MG PO CHEW
324.0000 mg | CHEWABLE_TABLET | Freq: Once | ORAL | Status: AC
  Administered 2015-04-09: 324 mg via ORAL
  Filled 2015-04-09: qty 4

## 2015-04-09 MED ORDER — MECLIZINE HCL 25 MG PO TABS
25.0000 mg | ORAL_TABLET | Freq: Once | ORAL | Status: AC
Start: 2015-04-09 — End: 2015-04-09
  Administered 2015-04-09: 25 mg via ORAL
  Filled 2015-04-09: qty 1

## 2015-04-09 MED ORDER — MECLIZINE HCL 25 MG PO TABS: 25.0000 mg | ORAL_TABLET | Freq: Three times a day (TID) | ORAL | Status: DC | PRN

## 2015-04-09 MED ORDER — SODIUM CHLORIDE 0.9 % IV BOLUS (SEPSIS)
1000.0000 mL | Freq: Once | INTRAVENOUS | Status: AC
  Administered 2015-04-09: 1000 mL via INTRAVENOUS

## 2015-04-09 NOTE — ED Notes (Signed)
Patient transported to CT 

## 2015-04-09 NOTE — Discharge Instructions (Signed)
Dizziness Dizziness is a common problem. It is a feeling of unsteadiness or light-headedness. You may feel like you are about to faint. Dizziness can lead to injury if you stumble or fall. Anyone can become dizzy, but dizziness is more common in older adults. This condition can be caused by a number of things, including medicines, dehydration, or illness. HOME CARE INSTRUCTIONS Taking these steps may help with your condition: Eating and Drinking  Drink enough fluid to keep your urine clear or pale yellow. This helps to keep you from becoming dehydrated. Try to drink more clear fluids, such as water.  Do not drink alcohol.  Limit your caffeine intake if directed by your health care provider.  Limit your salt intake if directed by your health care provider. Activity  Avoid making quick movements.  Rise slowly from chairs and steady yourself until you feel okay.  In the morning, first sit up on the side of the bed. When you feel okay, stand slowly while you hold onto something until you know that your balance is fine.  Move your legs often if you need to stand in one place for a long time. Tighten and relax your muscles in your legs while you are standing.  Do not drive or operate heavy machinery if you feel dizzy.  Avoid bending down if you feel dizzy. Place items in your home so that they are easy for you to reach without leaning over. Lifestyle  Do not use any tobacco products, including cigarettes, chewing tobacco, or electronic cigarettes. If you need help quitting, ask your health care provider.  Try to reduce your stress level, such as with yoga or meditation. Talk with your health care provider if you need help. General Instructions  Watch your dizziness for any changes.  Take medicines only as directed by your health care provider. Talk with your health care provider if you think that your dizziness is caused by a medicine that you are taking.  Tell a friend or a family  member that you are feeling dizzy. If he or she notices any changes in your behavior, have this person call your health care provider.  Keep all follow-up visits as directed by your health care provider. This is important. SEEK MEDICAL CARE IF:  Your dizziness does not go away.  Your dizziness or light-headedness gets worse.  You feel nauseous.  You have reduced hearing.  You have new symptoms.  You are unsteady on your feet or you feel like the room is spinning. SEEK IMMEDIATE MEDICAL CARE IF:  You vomit or have diarrhea and are unable to eat or drink anything.  You have problems talking, walking, swallowing, or using your arms, hands, or legs.  You feel generally weak.  You are not thinking clearly or you have trouble forming sentences. It may take a friend or family member to notice this.  You have chest pain, abdominal pain, shortness of breath, or sweating.  Your vision changes.  You notice any bleeding.  You have a headache.  You have neck pain or a stiff neck.  You have a fever.   This information is not intended to replace advice given to you by your health care provider. Make sure you discuss any questions you have with your health care provider.   Document Released: 11/20/2000 Document Revised: 10/11/2014 Document Reviewed: 05/23/2014 Elsevier Interactive Patient Education 2016 [employer]vier Inc.  Hyperglycemia High blood sugar (hyperglycemia) means that the level of sugar in your blood is higher than it  should be. Signs of high blood sugar include:  Feeling thirsty.  Frequent peeing (urinating).  Feeling tired or sleepy.  Dry mouth.  Vision changes.  Feeling weak.  Feeling hungry but losing weight.  Numbness and tingling in your hands or feet.  Headache. When you ignore these signs, your blood sugar may keep going up. These problems may get worse, and other problems may begin. HOME CARE  Check your blood sugars as told by your doctor. Write  down the numbers with the date and time.  Take the right amount of insulin or diabetes pills at the right time. Write down the dose with date and time.  Refill your insulin or diabetes pills before running out.  Watch what you eat. Follow your meal plan.  Drink liquids without sugar, such as water. Check with your doctor if you have kidney or heart disease.  Follow your doctor's orders for exercise. Exercise at the same time of day.  Keep your doctor's appointments. GET HELP RIGHT AWAY IF:   You have trouble thinking or are confused.  You have fast breathing with fruity smelling breath.  You pass out (faint).  You have 2 to 3 days of high blood sugars and you do not know why.  You have chest pain.  You are feeling sick to your stomach (nauseous) or throwing up (vomiting).  You have sudden vision changes. MAKE SURE YOU:   Understand these instructions.  Will watch your condition.  Will get help right away if you are not doing well or get worse.   This information is not intended to replace advice given to you by your health care provider. Make sure you discuss any questions you have with your health care provider.   Document Released: 03/24/2009 Document Revised: 06/17/2014 Document Reviewed: 01/31/2015 Elsevier Interactive Patient Education [employer].

## 2015-04-09 NOTE — ED Provider Notes (Signed)
[facility] Emergency Department Provider Note  ____________________________________________  Time seen: Seen upon arrival to the emergency department  I have reviewed the triage vital signs and the nursing notes.   HISTORY  Chief Complaint Dizziness    HPI [name] [name] is a 54 y.o. male with a history of coronary artery disease and diabetes who is presenting today with vertigo over the past week. He says he has been having intermittent vertigo which is worsened with movement. He said that just prior to arrival work he had severe vertigo as if the room was spinning. He says that it worsens when he turns his head. He denies any pain, nausea vomiting or diarrhea. Says that he has not taken his medicines in about a week since he was out of town.Denies any focal weakness. Says that he does have numbness from his knees down bilaterally that is chronic and related to his diabetes. Denies any ringing or roaring in his ears bilaterally. Says that he has had episodes like this in the past but never got an official diagnosis. Point-of-care glucose above 400 for EMS.   Past Medical History  Diagnosis Date  . Peripheral vascular disease ([place])   . Diabetes mellitus without complication ([place])   . Morbid obesity ([place])   . Syncope and collapse   . Chronic diastolic CHF (congestive heart failure) ([place])     a. 02/2013 EF 50% by LV gram.  . Asthma   . Hernia   . PAD (peripheral artery disease) ([place])     a. Angiography in March of 2012 showed 70-80% proximal right SFA stenosis status post self-expanding stent placement (6X60 mm). Angiography in April 2012 showed proximal left SFA occlusion status post self-expanding stent placement (7 X unkonw length). Both done by Dr. Lucky Cowboy.  . Coronary artery disease     a. Status post LAD PCI in 2013 at [facility];  b. October 2014 - Nuclear stress test showed inferior wall defect->Cath: patent LAD stent with 90% stenosis in the proximal RCA ( 2.5 x 23  mm DES), EF 50%;  c. 02/2014 MV w/ large inferior scar->Cath: LAD 25m, LCX 99m, 20d, RCA 20p/m, patent stent->Med Rx.  [name] Hyperlipidemia   . Hypertension   . Tobacco use     a. 75+ yr hx - still smoking 1ppd, down from 2 ppd.    Patient Active Problem List   Diagnosis Date Noted  . Morbid obesity ([place]) 11/17/2014  . PAD (peripheral artery disease) ([place])   . Coronary artery disease   . Hyperlipidemia   . Hypertension   . Tobacco use     Past Surgical History  Procedure Laterality Date  . Appendectomy    . Peripheral arterial stent graft      x2 left/right  . Clavicle surgery    . Hernia repair    . Cardiac catheterization  10/14    [facility]; X1 STENT PROXIMAL RCA  . Cardiac catheterization  02/2010    [facility]  . [facility]iac [facility]  [facility]  . Abdominal aortagram N/A 06/23/2013    Procedure: ABDOMINAL Maxcine Ham;  Surgeon: Wellington Hampshire, MD;  Location: [facility] CATH LAB;  Service: Cardiovascular;  Laterality: N/A;    Current Outpatient Rx  Name  Route  Sig  Dispense  Refill  . albuterol (PROVENTIL HFA;VENTOLIN HFA) 108 (90 BASE) MCG/ACT inhaler   Inhalation   Inhale 2 puffs into the lungs every 6 (six) hours as needed for wheezing or shortness of breath.  1 Inhaler   2   . gabapentin (NEURONTIN) 800 MG tablet   Oral   Take 800 mg by mouth 3 (three) times daily.         [name] HYDROcodone-acetaminophen (NORCO/VICODIN) 5-325 MG tablet   Oral   Take 1 tablet by mouth 2 (two) times daily.      0   . Insulin Glargine (LANTUS SOLOSTAR) 100 UNIT/ML Solostar Pen   Subcutaneous   Inject 10 Units into the skin at bedtime. Per sliding scale         . orphenadrine (NORFLEX) 100 MG tablet   Oral   Take 1 tablet by mouth 2 (two) times daily as needed.      1     Allergies Review of patient's allergies indicates no known allergies.  Family History  Problem Relation Age of Onset  . Heart attack Father   . Heart disease Father   . Hypertension Father   .  Hyperlipidemia Father   . Hypertension Mother   . Hyperlipidemia Mother     Social History Social History  Substance Use Topics  . Smoking status: Current Every Day Smoker -- 1.00 packs/day for 41 years    Types: Cigarettes  . Smokeless tobacco: None  . Alcohol Use: No    Review of Systems Constitutional: No fever/chills Eyes: No visual changes. ENT: No sore throat. Cardiovascular: Denies chest pain. Respiratory: Denies shortness of breath. Gastrointestinal: No abdominal pain.  No nausea, no vomiting.  No diarrhea.  No constipation. Genitourinary: Negative for dysuria. Musculoskeletal: Negative for back pain. Skin: Negative for rash. Neurological: Negative for headaches, focal weakness or numbness.  10-point ROS otherwise negative.  ____________________________________________   PHYSICAL EXAM:  VITAL SIGNS: ED Triage Vitals  Enc Vitals Group     BP --      Pulse --      Resp --      Temp --      Temp src --      SpO2 --      Weight --      Height --      Head Cir --      Peak Flow --      Pain Score --      Pain Loc --      Pain Edu? --      Excl. in [place]? --     Constitutional: Alert and oriented. Well appearing and in no acute distress. Eyes: Conjunctivae are normal. PERRL. bilateral nystagmus with lateral gazes. Head: Atraumatic. Nose: No congestion/rhinnorhea. Mouth/Throat: Mucous membranes are moist.  Oropharynx non-erythematous. Neck: No stridor.   Cardiovascular: Normal rate, regular rhythm. Grossly normal heart sounds.  Good peripheral circulation. Respiratory: Normal respiratory effort.  No retractions. Lungs CTAB. Gastrointestinal: Soft and nontender. No distention. No abdominal bruits. No CVA tenderness. Musculoskeletal: No lower extremity tenderness nor edema.  No joint effusions. Neurologic:  Normal speech and language. No gross focal neurologic deficits are appreciated. No ataxia on finger to nose or heel-to-shin testing bilaterally. Skin:   Skin is warm, dry and intact. No rash noted. Psychiatric: Mood and affect are normal. Speech and behavior are normal.  ____________________________________________   LABS (all labs ordered are listed, but only abnormal results are displayed)  Labs Reviewed  URINALYSIS COMPLETEWITH MICROSCOPIC ([facility] ONLY) - Abnormal; Notable for the following:    Color, Urine STRAW (*)    APPearance CLEAR (*)    Glucose, UA >500 (*)    Hgb urine dipstick 1+ (*)  Squamous Epithelial / LPF 0-5 (*)    All other components within normal limits  CBC WITH DIFFERENTIAL/PLATELET - Abnormal; Notable for the following:    Neutro Abs 8.9 (*)    All other components within normal limits  BASIC METABOLIC PANEL - Abnormal; Notable for the following:    Glucose, Bld 343 (*)    Calcium 8.7 (*)    All other components within normal limits  GLUCOSE, CAPILLARY - Abnormal; Notable for the following:    Glucose-Capillary 256 (*)    All other components within normal limits  TROPONIN I   ____________________________________________  EKG  ED ECG REPORT I, Doran Stabler, the attending physician, personally viewed and interpreted this ECG.   Date: 04/09/2015  EKG Time: 301  Rate: 102  Rhythm: sinus tachycardia  Axis: Normal axis  Intervals:none  ST&T Change: No ST segment elevation or depression. No abnormal T-wave inversion.  ____________________________________________  RADIOLOGY  Unremarkable noncontrast CT of the head. ____________________________________________   PROCEDURES    ____________________________________________   INITIAL IMPRESSION / ASSESSMENT AND PLAN / ED COURSE  Pertinent labs & imaging results that were available during my care of the patient were reviewed by me and considered in my medical decision making (see chart for details).  ----------------------------------------- 6:20 AM on 04/09/2015 -----------------------------------------  Patient is now  symptom-free. Able to ambulate and turn head quickly from side to side without any vertigo. Patient does have risk factors for stroke but his symptoms have been completely relieved with meclizine. Furthermore after week of symptoms his head CT appears normal. I counseled the patient extensively about taking his diabetes meds as well as aspirin. He is presently taking a baby aspirin daily. The patient says that he has not been taking his insulin for 1 week and this is likely the cause of his hyperglycemia. His sugars are now in the 200s and he is not showing labs that are consistent with DKA. He'll follow-up with his primary care physician and I will give him the number for neurology for follow-up. We'll also discharge him with some meclizine as a prescription. The patient understands the plan and is willing to comply.Heart rate is 96 on reexam.   ____________________________________________   FINAL CLINICAL IMPRESSION(S) / ED DIAGNOSES  Vertigo    Orbie Pyo, MD 04/09/15 [phone]

## 2015-04-09 NOTE — ED Notes (Signed)
[facility] pt  C/o vertigo s/sx for last week, pt ambulatory but nauseous when sudden moves of head. Pt states this reminds of "H1N1 illness" approx  6 years ago.

## 2015-04-09 NOTE — ED Notes (Signed)
Pt DC to home. Pt is ambulatory, oriented at this time, and NAD.

## 2015-05-07 ENCOUNTER — Emergency Department: Payer: BLUE CROSS/BLUE SHIELD

## 2015-05-07 ENCOUNTER — Emergency Department
Admission: EM | Admit: 2015-05-07 | Discharge: 2015-05-07 | Disposition: A | Discharge status: Home / Self Care | Payer: BLUE CROSS/BLUE SHIELD | Attending: Emergency Medicine | Admitting: Emergency Medicine

## 2015-05-07 ENCOUNTER — Encounter: Payer: Self-pay | Admitting: Emergency Medicine

## 2015-05-07 DIAGNOSIS — J45901 Unspecified asthma with (acute) exacerbation: Secondary | ICD-10-CM | POA: Diagnosis not present

## 2015-05-07 DIAGNOSIS — R079 Chest pain, unspecified: Secondary | ICD-10-CM | POA: Insufficient documentation

## 2015-05-07 DIAGNOSIS — Z794 Long term (current) use of insulin: Secondary | ICD-10-CM | POA: Diagnosis not present

## 2015-05-07 DIAGNOSIS — E119 Type 2 diabetes mellitus without complications: Secondary | ICD-10-CM | POA: Diagnosis not present

## 2015-05-07 DIAGNOSIS — I10 Essential (primary) hypertension: Secondary | ICD-10-CM | POA: Diagnosis not present

## 2015-05-07 DIAGNOSIS — F17210 Nicotine dependence, cigarettes, uncomplicated: Secondary | ICD-10-CM | POA: Diagnosis not present

## 2015-05-07 DIAGNOSIS — R1030 Lower abdominal pain, unspecified: Secondary | ICD-10-CM | POA: Insufficient documentation

## 2015-05-07 DIAGNOSIS — I5032 Chronic diastolic (congestive) heart failure: Secondary | ICD-10-CM | POA: Diagnosis not present

## 2015-05-07 DIAGNOSIS — Z79899 Other long term (current) drug therapy: Secondary | ICD-10-CM | POA: Insufficient documentation

## 2015-05-07 LAB — BASIC METABOLIC PANEL
Anion gap: 9 (ref 5–15)
BUN: 7 mg/dL (ref 6–20)
CO2: 26 mmol/L (ref 22–32)
Calcium: 9.3 mg/dL (ref 8.9–10.3)
Chloride: 99 mmol/L — ABNORMAL LOW (ref 101–111)
Creatinine, Ser: 0.72 mg/dL (ref 0.61–1.24)
GFR calc Af Amer: >60 mL/min (ref >60)
GFR calc non Af Amer: >60 mL/min (ref >60)
Glucose, Bld: 306 mg/dL — ABNORMAL HIGH (ref 65–99)
Potassium: 3.7 mmol/L (ref 3.5–5.1)
Sodium: 134 mmol/L — ABNORMAL LOW (ref 135–145)

## 2015-05-07 LAB — FIBRIN DERIVATIVES D-DIMER (ARMC ONLY): Fibrin derivatives D-dimer (ARMC): 595 — ABNORMAL HIGH (ref 0–499)

## 2015-05-07 LAB — CBC
HCT: 40.6 % (ref 40.0–52.0)
Hemoglobin: 14 g/dL (ref 13.0–18.0)
MCH: 29.9 pg (ref 26.0–34.0)
MCHC: 34.5 g/dL (ref 32.0–36.0)
MCV: 86.7 fL (ref 80.0–100.0)
Platelets: 259 10*3/uL (ref 150–440)
RBC: 4.69 MIL/uL (ref 4.40–5.90)
RDW: 14.1 % (ref 11.5–14.5)
WBC: 8.1 10*3/uL (ref 3.8–10.6)

## 2015-05-07 LAB — TROPONIN I: Troponin I: <0.03 ng/mL (ref <0.031)

## 2015-05-07 MED ORDER — IOHEXOL 350 MG/ML SOLN
75.0000 mL | Freq: Once | INTRAVENOUS | Status: AC | PRN
  Administered 2015-05-07: 75 mL via INTRAVENOUS

## 2015-05-07 NOTE — ED Notes (Addendum)
Pt presents to ER with complaint chest tightness on the left side with pain below chest radiating to back area that hurts when he breathes. Started last night at work. Pt has not taken anything to help with chest pain. Alert and oriented on arrival. Cardiac stent hx

## 2015-05-07 NOTE — ED Provider Notes (Signed)
[facility] Emergency Department Provider Note   ____________________________________________  Time seen: Approximately 5:23 PM  I have reviewed the triage vital signs and the nursing notes.   HISTORY  Chief Complaint Chest Pain    HPI [name] [name] is a 54 y.o. male smoker with a history of heart disease who complains of chest pain and shortness of breath starting last night. Pain is in the lower chest on the left side radiates to the back. Patient also reports she's had a bad cold about a week and primary caregiver some antibiotics a couple days ago. Patient reports he's very short of breath with exertion and even talking makes him more short of breath than usual.Chest pain does not seem to get worse with exertion however. Chest pain does sometimes get worse when he lays back. At that time the pain is worse in the back and radiates to the front. The pain is moderate in nature  Past Medical History  Diagnosis Date  . Peripheral vascular disease ([place])   . Diabetes mellitus without complication ([place])   . Morbid obesity ([place])   . Syncope and collapse   . Chronic diastolic CHF (congestive heart failure) ([place])     a. 02/2013 EF 50% by LV gram.  . Asthma   . Hernia   . PAD (peripheral artery disease) ([place])     a. Angiography in March of 2012 showed 70-80% proximal right SFA stenosis status post self-expanding stent placement (6X60 mm). Angiography in April 2012 showed proximal left SFA occlusion status post self-expanding stent placement (7 X unkonw length). Both done by Dr. Lucky Cowboy.  . Coronary artery disease     a. Status post LAD PCI in 2013 at [facility];  b. October 2014 - Nuclear stress test showed inferior wall defect->Cath: patent LAD stent with 90% stenosis in the proximal RCA ( 2.5 x 23 mm DES), EF 50%;  c. 02/2014 MV w/ large inferior scar->Cath: LAD 4m, LCX 85m, 20d, RCA 20p/m, patent stent->Med Rx.  [name] Hyperlipidemia   . Hypertension   . Tobacco use     a. 75+  yr hx - still smoking 1ppd, down from 2 ppd.    Patient Active Problem List   Diagnosis Date Noted  . Morbid obesity ([place]) 11/17/2014  . PAD (peripheral artery disease) ([place])   . Coronary artery disease   . Hyperlipidemia   . Hypertension   . Tobacco use     Past Surgical History  Procedure Laterality Date  . Appendectomy    . Peripheral arterial stent graft      x2 left/right  . Clavicle surgery    . Hernia repair    . Cardiac catheterization  10/14    [facility]; X1 STENT PROXIMAL RCA  . Cardiac [facility]  02/2010    [facility]  . [facility]  . Abdominal aortagram N/A 06/23/2013    Procedure: ABDOMINAL Maxcine Ham;  Surgeon: Wellington Hampshire, MD;  Location: [facility] CATH LAB;  Service: Cardiovascular;  Laterality: N/A;    Current Outpatient Rx  Name  Route  Sig  Dispense  Refill  . albuterol (PROVENTIL HFA;VENTOLIN HFA) 108 (90 BASE) MCG/ACT inhaler   Inhalation   Inhale 2 puffs into the lungs every 6 (six) hours as needed for wheezing or shortness of breath.   1 Inhaler   2   . gabapentin (NEURONTIN) 800 MG tablet   Oral   Take 800 mg by mouth 3 (three) times daily.         [name]  HYDROcodone-acetaminophen (NORCO/VICODIN) 5-325 MG tablet   Oral   Take 1 tablet by mouth 2 (two) times daily.      0   . Insulin Glargine (LANTUS SOLOSTAR) 100 UNIT/ML Solostar Pen   Subcutaneous   Inject 10 Units into the skin at bedtime. Per sliding scale         . meclizine (ANTIVERT) 25 MG tablet   Oral   Take 1 tablet (25 mg total) by mouth 3 (three) times daily as needed for dizziness.   30 tablet   0   . orphenadrine (NORFLEX) 100 MG tablet   Oral   Take 1 tablet by mouth 2 (two) times daily as needed.      1     Allergies Review of patient's allergies indicates no known allergies.  Family History  Problem Relation Age of Onset  . Heart attack Father   . Heart disease Father   . Hypertension Father   . Hyperlipidemia Father   . Hypertension  Mother   . Hyperlipidemia Mother     Social History Social History  Substance Use Topics  . Smoking status: Current Every Day Smoker -- 1.00 packs/day for 41 years    Types: Cigarettes  . Smokeless tobacco: None  . Alcohol Use: No    Review of Systems Constitutional: No fever/chills Eyes: No visual changes. ENT: No sore throat. Cardiovascular:  chest pain. Respiratory:shortness of breath. Gastrointestinal: No abdominal pain.  No nausea, no vomiting.  No diarrhea.  No constipation. Genitourinary: Negative for dysuria. Musculoskeletal: Negative for back pain. Skin: Negative for rash. Neurological: Negative for headaches, focal weakness or numbness.  10-point ROS otherwise negative.  ____________________________________________   PHYSICAL EXAM:  VITAL SIGNS: ED Triage Vitals  Enc Vitals Group     BP 05/07/15 1645 147/84 mmHg     Pulse Rate 05/07/15 1645 99     Resp 05/07/15 1645 18     Temp 05/07/15 1645 98.6 F (37 C)     Temp Source 05/07/15 1645 Oral     SpO2 05/07/15 1645 98 %     Weight 05/07/15 1645 246 lb (111.585 kg)     Height 05/07/15 1645 5\' 6"  (1.676 m)     Head Cir --      Peak Flow --      Pain Score 05/07/15 1646 5     Pain Loc --      Pain Edu? --      Excl. in [place]? --     Constitutional: Alert and oriented. Well appearing and in no acute distress. Eyes: Conjunctivae are normal. PERRL. EOMI. Head: Atraumatic. Nose: No congestion/rhinnorhea. Mouth/Throat: Mucous membranes are moist.  Oropharynx non-erythematous. Neck: No stridor.   Cardiovascular: Normal rate, regular rhythm. Grossly normal heart sounds.  Good peripheral circulation. Respiratory: Normal respiratory effort.  No retractions. Lungs wheezes diffusely Gastrointestinal: Soft mild lower abdominal tenderness which is present for a while. Patient reports 3 surgeries and mesh in his abdomen.. No distention. No abdominal bruits. No CVA tenderness. Musculoskeletal: No lower extremity  tenderness nor edema.  No joint effusions. Neurologic:  Normal speech and language. No gross focal neurologic deficits are appreciated. No gait instability. Skin:  Skin is warm, dry and intact. No rash noted. Psychiatric: Mood and affect are normal. Speech and behavior are normal.  ____________________________________________   LABS (all labs ordered are listed, but only abnormal results are displayed)  Labs Reviewed  BASIC METABOLIC PANEL - Abnormal; Notable for the following:    Sodium  134 (*)    Chloride 99 (*)    Glucose, Bld 306 (*)    All other components within normal limits  FIBRIN DERIVATIVES D-DIMER ([facility] ONLY) - Abnormal; Notable for the following:    Fibrin derivatives D-dimer ([facility]) 595 (*)    All other components within normal limits  CBC  TROPONIN I   ____________________________________________  EKG  EKG read and interpreted by me shows normal sinus rhythm rate of 96 left axis diffuse ST-T wave flattening there are a couple premature supraventricular, reflexes. EKG looks similar to one from the 30th October of this year ____________________________________________  RADIOLOGY  CT scan shows no sign of pulmonary emboli or radiologist ____________________________________________   PROCEDURES  I discussed with patient the fact that his EKG and blood work looked okay and that the CT is okay and he can follow-up with Dr. Velva Harman tomorrow ____________________________________________   INITIAL IMPRESSION / [place] / ED COURSE  Pertinent labs & imaging results that were available during my care of the patient were reviewed by me and considered in my medical decision making (see chart for details).   ____________________________________________   FINAL CLINICAL IMPRESSION(S) / ED DIAGNOSES  Final diagnoses:  Chest pain, unspecified chest pain type      Nena Polio, MD 05/07/15 2021

## 2015-05-11 ENCOUNTER — Ambulatory Visit (INDEPENDENT_AMBULATORY_CARE_PROVIDER_SITE_OTHER): Payer: BLUE CROSS/BLUE SHIELD | Admitting: Cardiovascular Disease

## 2015-05-11 ENCOUNTER — Encounter: Payer: Self-pay | Admitting: Cardiovascular Disease

## 2015-05-11 VITALS — BP 118/80 | HR 92 | Ht 66.0 in | Wt 242.0 lb

## 2015-05-11 DIAGNOSIS — I25118 Atherosclerotic heart disease of native coronary artery with other forms of angina pectoris: Secondary | ICD-10-CM

## 2015-05-11 DIAGNOSIS — I739 Peripheral vascular disease, unspecified: Secondary | ICD-10-CM | POA: Diagnosis not present

## 2015-05-11 DIAGNOSIS — I10 Essential (primary) hypertension: Secondary | ICD-10-CM | POA: Diagnosis not present

## 2015-05-11 DIAGNOSIS — E785 Hyperlipidemia, unspecified: Secondary | ICD-10-CM

## 2015-05-11 MED ORDER — ATORVASTATIN CALCIUM 40 MG PO TABS
40.0000 mg | ORAL_TABLET | Freq: Every day | ORAL | Status: DC
Start: 2015-05-11 — End: 2015-11-21

## 2015-05-11 MED ORDER — ASPIRIN EC 81 MG PO TBEC
81.0000 mg | DELAYED_RELEASE_TABLET | Freq: Every day | ORAL | Status: DC
Start: 2015-05-11 — End: 2020-05-16

## 2015-05-11 MED ORDER — CILOSTAZOL 100 MG PO TABS: 100.0000 mg | ORAL_TABLET | Freq: Two times a day (BID) | ORAL | Status: DC

## 2015-05-11 NOTE — Patient Instructions (Addendum)
Medication Instructions:  Your physician has recommended you make the following change in your medication: DECREASE aspirin to 81mg  once per day START taking atorvastatin 40mg  once per day START taking pletal 100mg  twice per day  Labwork: none  Testing/Procedures: Your physician has requested that you have a lower extremity arterial doppler. During this test,  ultrasound is used to evaluate arterial blood flow in the legs. Allow one hour for this exam. There are no restrictions or special instructions.  Follow-Up: Your physician recommends that you schedule a follow-up appointment in: two months with Dr. Fletcher Anon.    Any Other Special Instructions Will Be Listed Below (If Applicable).     If you need a refill on your cardiac medications before your next appointment, please call your pharmacy.

## 2015-05-12 ENCOUNTER — Other Ambulatory Visit: Payer: Self-pay | Admitting: Cardiovascular Disease

## 2015-05-12 DIAGNOSIS — I739 Peripheral vascular disease, unspecified: Secondary | ICD-10-CM

## 2015-05-14 NOTE — Assessment & Plan Note (Signed)
He has not been taking atorvastatin and this was refilled today. I discussed with him the importance of taking his medications regularly. He will require follow-up lipid and liver profile .

## 2015-05-14 NOTE — Assessment & Plan Note (Signed)
Blood pressure is controlled without medications.

## 2015-05-14 NOTE — Progress Notes (Signed)
primary care physician: Dr. Carmela [name]  HPI  [name] who is here today for a followup visit regarding CAD and PAD.  He has known history of coronary artery disease with previous stenting of the LAD in 2013 at [facility] and proximal RCA stent in October 2014 by me. He also has known history of peripheral arterial disease status post bilateral SFA stenting in 2012 . Other medical problems include type 2 diabetes, hypertension, hyperlipidemia and tobacco use.  He has lower extremity angiography done in 2015 for increased claudication which showed: 1. No significant aortoiliac disease.  2. Significant right common femoral artery stenosis. Patent mid SFA stent with Three-vessel runoff below the knee.  3. Occluded left SFA just proximal to the previously placed stent with reconstitution distally via very good collaterals from the profunda.  He had a repeat cardiac catheterization in September 2015 for chest pain which showed patent stents with nonobstructive disease. He was seen by [name] [name] in June of this year for chest pain. He underwent a nuclear stress test which showed no evidence of ischemia. Echocardiogram showed normal LV systolic function with grade 1 diastolic dysfunction. He had a recent emergency room visit for chest pain. He complains of dull type chest pain on the left side which is overall mild but worsens with activities. This has been associated with shortness of breath. Basic workup in the emergency room was negative. He complains of bilateral calf pain right worse than left after walking about one block. He is trying to quit smoking and is down to a few cigarettes a day. He is also using electronic cigarettes. He has not been taking atorvastatin or cilostazol.   No Known Allergies   Current Outpatient Prescriptions on File Prior to Visit  Medication Sig Dispense Refill  . albuterol (PROVENTIL HFA;VENTOLIN HFA) 108 (90 BASE) MCG/ACT inhaler Inhale 2 puffs into the  lungs every 6 (six) hours as needed for wheezing or shortness of breath. 1 Inhaler 2  . gabapentin (NEURONTIN) 800 MG tablet Take 800 mg by mouth 3 (three) times daily.    [name] HYDROcodone-acetaminophen (NORCO/VICODIN) 5-325 MG tablet Take 1 tablet by mouth 2 (two) times daily.  0  . Insulin Glargine (LANTUS SOLOSTAR) 100 UNIT/ML Solostar Pen Inject 35 Units into the skin at bedtime. Per sliding scale    . meclizine (ANTIVERT) 25 MG tablet Take 1 tablet (25 mg total) by mouth 3 (three) times daily as needed for dizziness. 30 tablet 0  . orphenadrine (NORFLEX) 100 MG tablet Take 1 tablet by mouth 2 (two) times daily as needed.  1   No current facility-administered medications on file prior to visit.     Past Medical History  Diagnosis Date  . Peripheral vascular disease ([place])   . Diabetes mellitus without complication ([place])   . Morbid obesity ([place])   . Syncope and collapse   . Chronic diastolic CHF (congestive heart failure) ([place])     a. 02/2013 EF 50% by LV gram.  . Asthma   . Hernia   . PAD (peripheral artery disease) ([place])     a. Angiography in March of 2012 showed 70-80% proximal right SFA stenosis status post self-expanding stent placement (6X60 mm). Angiography in April 2012 showed proximal left SFA occlusion status post self-expanding stent placement (7 X unkonw length). Both done by Dr. Lucky [name].  . Coronary artery disease     a. Status post LAD PCI in 2013 at [facility];  b. October 2014 - Nuclear  stress test showed inferior wall defect->Cath: patent LAD stent with 90% stenosis in the proximal RCA ( 2.5 x 23 mm DES), EF 50%;  c. 02/2014 MV w/ large inferior scar->Cath: LAD 22m, LCX 42m, 20d, RCA 20p/m, patent stent->Med Rx.  [name] Hyperlipidemia   . Hypertension   . Tobacco use     a. 75+ yr hx - still smoking 1ppd, down from 2 ppd.     Past Surgical History  Procedure Laterality Date  . Appendectomy    . Peripheral arterial stent graft      x2 left/right  . Clavicle surgery    . Hernia  repair    . Cardiac catheterization  10/14    [facility]; X1 STENT PROXIMAL RCA  . Cardiac catheterization  02/2010    [facility]  . Cardiac catheterization  02/21/2014    [facility]  . Abdominal aortagram N/A 06/23/2013    Procedure: ABDOMINAL Maxcine Ham;  Surgeon: [name] Hampshire, MD;  Location: [facility] CATH LAB;  Service: Cardiovascular;  Laterality: N/A;     Family History  Problem Relation Age of Onset  . Heart attack Father   . Heart disease Father   . Hypertension Father   . Hyperlipidemia Father   . Hypertension Mother   . Hyperlipidemia Mother      Social History   Social History  . Marital Status: Single    Spouse Name: N/A  . Number of Children: N/A  . Years of Education: N/A   Occupational History  . Not on file.   Social History Main Topics  . Smoking status: Current Every Day Smoker -- 1.00 packs/day for 41 years    Types: Cigarettes  . Smokeless tobacco: Not on file  . Alcohol Use: No  . Drug Use: No  . Sexual Activity: Not on file   Other Topics Concern  . Not on file   Social History Narrative      PHYSICAL EXAM   BP 118/80 mmHg  Pulse 92  Ht 5\' 6"  (1.676 m)  Wt 242 lb (109.77 kg)  BMI 39.08 kg/m2 Constitutional: He is oriented to person, place, and time. He appears well-developed and well-nourished. No distress.  HENT: No nasal discharge.  Head: Normocephalic and atraumatic.  Eyes: Pupils are equal and round.  No discharge. Neck: Normal range of motion. Neck supple. No JVD present. No thyromegaly present.  Cardiovascular: Normal rate, regular rhythm, normal heart sounds. Exam reveals no gallop and no friction rub. No murmur heard.  Pulmonary/Chest: Effort normal and breath sounds normal. No stridor. No respiratory distress. He has no wheezes. He has no rales. He exhibits no tenderness.  Abdominal: Soft. Bowel sounds are normal. He exhibits no distension. There is no tenderness. There is no rebound and no guarding.  Musculoskeletal: Normal range of motion. He  exhibits +1 edema and no tenderness.  Neurological: He is alert and oriented to person, place, and time. Coordination normal.  Skin: Skin is warm and dry. No rash noted. He is not diaphoretic. No erythema. No pallor.  Psychiatric: He has a normal mood and affect. His behavior is normal. Judgment and thought content normal.  Femoral pulses are mildly reduced.  Distal pulses are not palpable.   EKG Normal sinus rhythm with left anterior fascicular block.   ASSESSMENT AND PLAN

## 2015-05-14 NOTE — Assessment & Plan Note (Signed)
The patient complains of increased claudication but he stopped taking cilostazol. This was refilled. I requested lower extremity arterial Doppler for follow-up.

## 2015-05-14 NOTE — Assessment & Plan Note (Signed)
The patient has known history of coronary artery disease with recurrent atypical chest pain. Cardiac catheterization last year showed patent RCA and LAD stents. Nuclear stress test in June also showed no evidence of ischemia. I favor continued medical therapy for now. If he has recurrent pain, we might need to consider repeating his cardiac catheterization.

## 2015-05-17 ENCOUNTER — Encounter: Payer: Self-pay | Admitting: Cardiovascular Disease

## 2015-06-09 ENCOUNTER — Telehealth: Payer: Self-pay | Admitting: *Deleted

## 2015-06-09 NOTE — Telephone Encounter (Signed)
lmov to schedule LE ART ordered by Dr Lanice Schwab  Order on file.

## 2015-06-29 ENCOUNTER — Other Ambulatory Visit: Payer: Self-pay | Admitting: Vascular Surgery

## 2015-07-05 ENCOUNTER — Other Ambulatory Visit
Admission: RE | Admit: 2015-07-05 | Discharge: 2015-07-05 | Disposition: A | Discharge status: Home / Self Care | Payer: BLUE CROSS/BLUE SHIELD | Source: Ambulatory Visit | Attending: Vascular Surgery | Admitting: Vascular Surgery

## 2015-07-05 DIAGNOSIS — Z029 Encounter for administrative examinations, unspecified: Secondary | ICD-10-CM | POA: Insufficient documentation

## 2015-07-05 LAB — CREATININE, SERUM
Creatinine, Ser: 0.84 mg/dL (ref 0.61–1.24)
GFR calc Af Amer: >60 mL/min (ref >60)
GFR calc non Af Amer: >60 mL/min (ref >60)

## 2015-07-05 LAB — BUN: BUN: 15 mg/dL (ref 6–20)

## 2015-07-06 ENCOUNTER — Encounter: Payer: Self-pay | Admitting: *Deleted

## 2015-07-06 ENCOUNTER — Ambulatory Visit
Admission: RE | Admit: 2015-07-06 | Discharge: 2015-07-06 | Disposition: A | Discharge status: Home / Self Care | Payer: BLUE CROSS/BLUE SHIELD | Source: Ambulatory Visit | Attending: Vascular Surgery | Admitting: Vascular Surgery

## 2015-07-06 ENCOUNTER — Encounter
Admission: RE | Disposition: A | Discharge status: Home / Self Care | Payer: Self-pay | Source: Ambulatory Visit | Attending: Vascular Surgery

## 2015-07-06 DIAGNOSIS — I10 Essential (primary) hypertension: Secondary | ICD-10-CM | POA: Diagnosis not present

## 2015-07-06 DIAGNOSIS — I8390 Asymptomatic varicose veins of unspecified lower extremity: Secondary | ICD-10-CM | POA: Diagnosis not present

## 2015-07-06 DIAGNOSIS — G4730 Sleep apnea, unspecified: Secondary | ICD-10-CM | POA: Diagnosis not present

## 2015-07-06 DIAGNOSIS — N400 Enlarged prostate without lower urinary tract symptoms: Secondary | ICD-10-CM | POA: Insufficient documentation

## 2015-07-06 DIAGNOSIS — Z79899 Other long term (current) drug therapy: Secondary | ICD-10-CM | POA: Diagnosis not present

## 2015-07-06 DIAGNOSIS — J449 Chronic obstructive pulmonary disease, unspecified: Secondary | ICD-10-CM | POA: Diagnosis not present

## 2015-07-06 DIAGNOSIS — E669 Obesity, unspecified: Secondary | ICD-10-CM | POA: Diagnosis not present

## 2015-07-06 DIAGNOSIS — I70211 Atherosclerosis of native arteries of extremities with intermittent claudication, right leg: Secondary | ICD-10-CM | POA: Insufficient documentation

## 2015-07-06 DIAGNOSIS — Z794 Long term (current) use of insulin: Secondary | ICD-10-CM | POA: Diagnosis not present

## 2015-07-06 DIAGNOSIS — E7800 Pure hypercholesterolemia, unspecified: Secondary | ICD-10-CM | POA: Insufficient documentation

## 2015-07-06 DIAGNOSIS — M1990 Unspecified osteoarthritis, unspecified site: Secondary | ICD-10-CM | POA: Diagnosis not present

## 2015-07-06 DIAGNOSIS — E1140 Type 2 diabetes mellitus with diabetic neuropathy, unspecified: Secondary | ICD-10-CM | POA: Insufficient documentation

## 2015-07-06 DIAGNOSIS — J45909 Unspecified asthma, uncomplicated: Secondary | ICD-10-CM | POA: Insufficient documentation

## 2015-07-06 HISTORY — DX: Sleep apnea, unspecified: G47.30

## 2015-07-06 HISTORY — DX: Polyneuropathy, unspecified: G62.9

## 2015-07-06 HISTORY — PX: PERIPHERAL VASCULAR CATHETERIZATION: SHX172C

## 2015-07-06 HISTORY — DX: Unspecified atherosclerosis: I70.90

## 2015-07-06 HISTORY — DX: Benign prostatic hyperplasia without lower urinary tract symptoms: N40.0

## 2015-07-06 HISTORY — DX: Unspecified osteoarthritis, unspecified site: M19.90

## 2015-07-06 HISTORY — DX: Asymptomatic varicose veins of unspecified lower extremity: I83.90

## 2015-07-06 HISTORY — DX: Chronic obstructive pulmonary disease, unspecified: J44.9

## 2015-07-06 SURGERY — ABDOMINAL AORTOGRAM W/LOWER EXTREMITY
Laterality: Right | Wound class: Clean

## 2015-07-06 MED ORDER — MIDAZOLAM HCL 5 MG/5ML IJ SOLN
INTRAMUSCULAR | Status: AC
  Filled 2015-07-06: qty 5

## 2015-07-06 MED ORDER — SODIUM CHLORIDE 0.9 % IV SOLN
INTRAVENOUS | Status: DC
  Administered 2015-07-06: 09:00:00 via INTRAVENOUS

## 2015-07-06 MED ORDER — IOHEXOL 300 MG/ML  SOLN
INTRAMUSCULAR | Status: DC | PRN
  Administered 2015-07-06: 55 mL via INTRA_ARTERIAL

## 2015-07-06 MED ORDER — HEPARIN SODIUM (PORCINE) 1000 UNIT/ML IJ SOLN
INTRAMUSCULAR | Status: DC | PRN
  Administered 2015-07-06: 5000 [IU] via INTRAVENOUS

## 2015-07-06 MED ORDER — METHYLPREDNISOLONE SODIUM SUCC 125 MG IJ SOLR: 125.0000 mg | INTRAMUSCULAR | Status: DC | PRN

## 2015-07-06 MED ORDER — HYDROMORPHONE HCL 1 MG/ML IJ SOLN
1.0000 mg | Freq: Once | INTRAMUSCULAR | Status: AC
  Administered 2015-07-06: 1 mg via INTRAVENOUS

## 2015-07-06 MED ORDER — HEPARIN SODIUM (PORCINE) 1000 UNIT/ML IJ SOLN
INTRAMUSCULAR | Status: AC
  Filled 2015-07-06: qty 1

## 2015-07-06 MED ORDER — SODIUM CHLORIDE 0.9 % IJ SOLN
INTRAMUSCULAR | Status: AC
  Filled 2015-07-06: qty 6

## 2015-07-06 MED ORDER — CLOPIDOGREL BISULFATE 75 MG PO TABS
75.0000 mg | ORAL_TABLET | Freq: Every day | ORAL | Status: DC
  Administered 2015-07-06: 75 mg via ORAL

## 2015-07-06 MED ORDER — HEPARIN (PORCINE) IN NACL 2-0.9 UNIT/ML-% IJ SOLN
INTRAMUSCULAR | Status: AC
  Filled 2015-07-06: qty 1000

## 2015-07-06 MED ORDER — CEFUROXIME SODIUM 1.5 G IJ SOLR
1.5000 g | INTRAMUSCULAR | Status: AC
  Administered 2015-07-06: 1.5 g via INTRAVENOUS

## 2015-07-06 MED ORDER — ACETAMINOPHEN 325 MG PO TABS: 325.0000 mg | ORAL_TABLET | ORAL | Status: DC | PRN

## 2015-07-06 MED ORDER — SODIUM CHLORIDE 0.9 % IV SOLN
500.0000 mL | Freq: Once | INTRAVENOUS | Status: DC | PRN
Start: 2015-07-06 — End: 2015-07-06

## 2015-07-06 MED ORDER — METOPROLOL TARTRATE 1 MG/ML IV SOLN: 2.0000 mg | INTRAVENOUS | Status: DC | PRN

## 2015-07-06 MED ORDER — HYDRALAZINE HCL 20 MG/ML IJ SOLN: 5.0000 mg | INTRAMUSCULAR | Status: DC | PRN

## 2015-07-06 MED ORDER — LABETALOL HCL 5 MG/ML IV SOLN: 10.0000 mg | INTRAVENOUS | Status: DC | PRN

## 2015-07-06 MED ORDER — FENTANYL CITRATE (PF) 100 MCG/2ML IJ SOLN
INTRAMUSCULAR | Status: AC
  Filled 2015-07-06: qty 2

## 2015-07-06 MED ORDER — LIDOCAINE-EPINEPHRINE (PF) 1 %-1:200000 IJ SOLN
INTRAMUSCULAR | Status: DC | PRN
  Administered 2015-07-06: 10 mL via INTRADERMAL

## 2015-07-06 MED ORDER — ONDANSETRON HCL 4 MG/2ML IJ SOLN: 4.0000 mg | Freq: Four times a day (QID) | INTRAMUSCULAR | Status: DC | PRN

## 2015-07-06 MED ORDER — OXYCODONE-ACETAMINOPHEN 5-325 MG PO TABS: 1.0000 | ORAL_TABLET | ORAL | Status: DC | PRN

## 2015-07-06 MED ORDER — HYDROMORPHONE HCL 1 MG/ML IJ SOLN: 0.5000 mg | INTRAMUSCULAR | Status: DC | PRN

## 2015-07-06 MED ORDER — MIDAZOLAM HCL 2 MG/2ML IJ SOLN
INTRAMUSCULAR | Status: DC | PRN
  Administered 2015-07-06: 2 mg via INTRAVENOUS
  Administered 2015-07-06 (×3): 1 mg via INTRAVENOUS

## 2015-07-06 MED ORDER — PHENOL 1.4 % MT LIQD: 1.0000 | OROMUCOSAL | Status: DC | PRN

## 2015-07-06 MED ORDER — FAMOTIDINE 20 MG PO TABS: 40.0000 mg | ORAL_TABLET | ORAL | Status: DC | PRN

## 2015-07-06 MED ORDER — CLOPIDOGREL BISULFATE 75 MG PO TABS: 75.0000 mg | ORAL_TABLET | Freq: Every day | ORAL | Status: DC

## 2015-07-06 MED ORDER — HYDROMORPHONE HCL 1 MG/ML IJ SOLN
INTRAMUSCULAR | Status: AC
  Filled 2015-07-06: qty 1

## 2015-07-06 MED ORDER — FENTANYL CITRATE (PF) 100 MCG/2ML IJ SOLN
INTRAMUSCULAR | Status: DC | PRN
  Administered 2015-07-06 (×4): 50 ug via INTRAVENOUS

## 2015-07-06 MED ORDER — LIDOCAINE-EPINEPHRINE (PF) 1 %-1:200000 IJ SOLN
INTRAMUSCULAR | Status: AC
  Filled 2015-07-06: qty 30

## 2015-07-06 MED ORDER — ACETAMINOPHEN 325 MG RE SUPP: 325.0000 mg | RECTAL | Status: DC | PRN

## 2015-07-06 MED ORDER — GUAIFENESIN-DM 100-10 MG/5ML PO SYRP: 15.0000 mL | ORAL_SOLUTION | ORAL | Status: DC | PRN

## 2015-07-06 MED ORDER — CLOPIDOGREL BISULFATE 75 MG PO TABS
ORAL_TABLET | ORAL | Status: AC
  Administered 2015-07-06: 75 mg via ORAL
  Filled 2015-07-06: qty 1

## 2015-07-06 SURGICAL SUPPLY — 16 items
BALLN LUTONIX 5X150X130 (BALLOONS) ×4
BALLN LUTONIX 6X120X130 (BALLOONS) ×4
BALLOON LUTONIX 5X150X130 (BALLOONS) ×3 IMPLANT
BALLOON LUTONIX 6X120X130 (BALLOONS) ×3 IMPLANT
CATH KA2 5FR 65CM (CATHETERS) ×4 IMPLANT
CATH PIG 70CM (CATHETERS) ×4 IMPLANT
DEVICE PRESTO INFLATION (MISCELLANEOUS) ×4 IMPLANT
DEVICE STARCLOSE SE CLOSURE (Vascular Products) ×4 IMPLANT
GLIDEWIRE ADV .035X260CM (WIRE) ×4 IMPLANT
LIFESTENT 6X120X130 (Permanent Stent) ×4 IMPLANT
PACK ANGIOGRAPHY (CUSTOM PROCEDURE TRAY) ×4 IMPLANT
SHEATH ANL2 6FRX45 HC (SHEATH) ×4 IMPLANT
SHEATH BRITE TIP 5FRX11 (SHEATH) ×4 IMPLANT
SYR MEDRAD MARK V 150ML (SYRINGE) ×4 IMPLANT
TUBING CONTRAST HIGH PRESS 72 (TUBING) ×4 IMPLANT
WIRE J 3MM .035X145CM (WIRE) ×4 IMPLANT

## 2015-07-06 NOTE — H&P (Signed)
  [facility] History & Physical Update  The patient was interviewed and re-examined.  The patient's previous History and Physical has been reviewed and is unchanged.  There is no change in the plan of care. We plan to proceed with the scheduled procedure.  Jasai Sorg, MD  07/06/2015, 8:29 AM

## 2015-07-06 NOTE — Op Note (Signed)
[facility] Percutaneous Study/Intervention Procedural Note   Date of Surgery: 07/06/2015  Surgeon(s):Megean Fabio   Assistants:none  Pre-operative Diagnosis: PAD with claudication right lower extremity  Post-operative diagnosis: Same  Procedure(s) Performed: 1. Ultrasound guidance for vascular access left femoral artery 2. Catheter placement into right superficial femoral artery from left femoral approach 3. Aortogram and selective right lower extremity angiogram 4. Percutaneous transluminal angioplasty of right SFA with 5 mm diameter by 15 cm length Lutonix drug-coated angioplasty balloon 5. Percutaneous transluminal angioplasty of the right external iliac artery and common femoral artery with 6 mm diameter by 12 cm length Lutonix drug-coated angioplasty balloon  6.  Self-expanding stent placement in the proximal superficial femoral artery with 6 mm diameter by 12 cm length stent greater than 70% residual stenosis after angioplasty 7. StarClose closure device left femoral artery  EBL: 25 cc  Contrast: 55 cc  Fluro Time: 4.4 minutes  Moderate Conscious Sedation Time: approximately 45 minutes using 5 mg of Versed and 200 mcg of Fentanyl  Indications: Patient is a 55 y.o.male with short distance claudication of the right lower extremity. He is undergone previous intervention to both lower extremities years ago, and returns with very short distance claudication in the right leg. The patient is brought in for angiography for further evaluation and potential treatment. Risks and benefits are discussed and informed consent is obtained  Procedure: The patient was identified and appropriate procedural time out was performed. The patient was then placed supine on the table and prepped and draped in the usual sterile fashion.Moderate conscious sedation was administered  throughout the procedure with my supervision of the RN administering medicines and monitoring the patient's vital signs, pulse oximetry, telemetry and mental status throughout from the start of the procedure until the patient was taken to the recovery room. Ultrasound was used to evaluate the left common femoral artery. It was patent . A digital ultrasound image was acquired. A Seldinger needle was used to access the left common femoral artery under direct ultrasound guidance and a permanent image was performed. A 0.035 J wire was advanced without resistance and a 5Fr sheath was placed. Pigtail catheter was placed into the aorta and an AP aortogram was performed. This demonstrated normal renal arteries and normal aorta and iliac segments without significant stenosis on the left.  The right common iliac artery was patent, but the external iliac artery down into the common femoral artery had a significant stenosis in the 70-80% range. I then crossed the aortic bifurcation and advanced to the right femoral head. Selective right lower extremity angiogram was then performed. This demonstrated proximal to mid SFA with high grade stenosis and short segment occlusion.  Two vessel runoff distally. The patient was systemically heparinized and a 6 [place] Ansell sheath was then placed over the [employer] wire. I then used a Kumpe catheter and the advantage wire to navigate through the external iliac and common femoral stenosis and then into the SFA and cross the SFA stenosis and occlusion. I then confirmed intraluminal flow in the mid to distal superficial femoral artery and decided to proceed with intervention at this point. The advantage wire was replaced. I treated the SFA lesion with a 5 mm diameter by 15 cm length Lutonix drug-coated angioplasty balloon. This was in the proximal to mid SFA and was inflated to 14 atm for 1 minute. I then turned my attention to the common femoral and external iliac artery lesion.  A 6 mm diameter by 12 cm  length Lutonix drug-coated angioplasty balloon was inflated to 12 atm for 1 minute across these lesions. Angiogram following these 2 balloon inflation showed about a 30% residual stenosis in the common femoral and external iliac artery but residual high-grade stenosis in the proximal SFA particularly just above the previously placed stent in the mid SFA. The SFA lesion was then treated with a 6 mm diameter by 12 cm length life stent that was postdilated with a 6 mm balloon for the significant residual stenosis after angioplasty. Completion angiogram showed about a 30-35% residual stenosis in the calcified lesion in the proximal SFA, but this was no longer flow limiting and had markedly improved flow distally. I elected to terminate the procedure. The sheath was removed and StarClose closure device was deployed in the left femoral artery with excellent hemostatic result. The patient was taken to the recovery room in stable condition having tolerated the procedure well.  Findings:  Aortogram: renal arteries normal, aorta normal.  Left iliac normal.  Right iliac normal to the external iliac but narrowing in the external iliac distally and the common femoral artery of 70-80% Right Lower Extremity: proximal to mid SFA with high grade stenosis and short segment occlusion.  Two vessel runoff distally   Disposition: Patient was taken to the recovery room in stable condition having tolerated the procedure well.  Complications: None  Chandlar Staebell 07/06/2015 9:43 AM

## 2015-07-06 NOTE — Discharge Instructions (Signed)
Angiogram, Care After °Refer to this sheet in the next few weeks. These instructions provide you with information about caring for yourself after your procedure. Your health care provider may also give you more specific instructions. Your treatment has been planned according to current medical practices, but problems sometimes occur. Call your health care provider if you have any problems or questions after your procedure. °WHAT TO EXPECT AFTER THE PROCEDURE °After your procedure, it is typical to have the following: °· Bruising at the catheter insertion site that usually fades within 1-2 weeks. °· Blood collecting in the tissue (hematoma) that may be painful to the touch. It should usually decrease in size and tenderness within 1-2 weeks. °HOME CARE INSTRUCTIONS °· Take medicines only as directed by your health care provider. °· You may shower 24-48 hours after the procedure or as directed by your health care provider. Remove the bandage (dressing) and gently wash the site with plain soap and water. Pat the area dry with a clean towel. Do not rub the site, because this may cause bleeding. °· Do not take baths, swim, or use a hot tub until your health care provider approves. °· Check your insertion site every day for redness, swelling, or drainage. °· Do not apply powder or lotion to the site. °· Do not lift over 10 lb (4.5 kg) for 5 days after your procedure or as directed by your health care provider. °· Ask your health care provider when it is okay to: °¨ Return to work or school. °¨ Resume usual physical activities or sports. °¨ Resume sexual activity. °· Do not drive home if you are discharged the same day as the procedure. Have someone else drive you. °· You may drive 24 hours after the procedure unless otherwise instructed by your health care provider. °· Do not operate machinery or power tools for 24 hours after the procedure or as directed by your health care provider. °· If your procedure was done as an  outpatient procedure, which means that you went home the same day as your procedure, a responsible adult should be with you for the first 24 hours after you arrive home. °· Keep all follow-up visits as directed by your health care provider. This is important. °SEEK MEDICAL CARE IF: °· You have a fever. °· You have chills. °· You have increased bleeding from the catheter insertion site. Hold pressure on the site. °SEEK IMMEDIATE MEDICAL CARE IF: °· You have unusual pain at the catheter insertion site. °· You have redness, warmth, or swelling at the catheter insertion site. °· You have drainage (other than a small amount of blood on the dressing) from the catheter insertion site. °· The catheter insertion site is bleeding, and the bleeding does not stop after 30 minutes of holding steady pressure on the site. °· The area near or just beyond the catheter insertion site becomes pale, cool, tingly, or numb. °  °This information is not intended to replace advice given to you by your health care provider. Make sure you discuss any questions you have with your health care provider. °  °Document Released: 12/13/2004 Document Revised: 06/17/2014 Document Reviewed: 10/28/2012 °Elsevier Interactive Patient Education ©2016 [employer] ° °

## 2015-07-07 ENCOUNTER — Encounter: Payer: Self-pay | Admitting: Vascular Surgery

## 2015-07-09 ENCOUNTER — Encounter: Payer: Self-pay | Admitting: Emergency Medicine

## 2015-07-09 DIAGNOSIS — Z79899 Other long term (current) drug therapy: Secondary | ICD-10-CM | POA: Diagnosis not present

## 2015-07-09 DIAGNOSIS — Y9389 Activity, other specified: Secondary | ICD-10-CM | POA: Diagnosis not present

## 2015-07-09 DIAGNOSIS — F17210 Nicotine dependence, cigarettes, uncomplicated: Secondary | ICD-10-CM | POA: Diagnosis not present

## 2015-07-09 DIAGNOSIS — S8991XA Unspecified injury of right lower leg, initial encounter: Secondary | ICD-10-CM | POA: Diagnosis present

## 2015-07-09 DIAGNOSIS — Z7982 Long term (current) use of aspirin: Secondary | ICD-10-CM | POA: Insufficient documentation

## 2015-07-09 DIAGNOSIS — E119 Type 2 diabetes mellitus without complications: Secondary | ICD-10-CM | POA: Insufficient documentation

## 2015-07-09 DIAGNOSIS — Y998 Other external cause status: Secondary | ICD-10-CM | POA: Diagnosis not present

## 2015-07-09 DIAGNOSIS — S79921A Unspecified injury of right thigh, initial encounter: Secondary | ICD-10-CM | POA: Diagnosis not present

## 2015-07-09 DIAGNOSIS — Y9289 Other specified places as the place of occurrence of the external cause: Secondary | ICD-10-CM | POA: Insufficient documentation

## 2015-07-09 DIAGNOSIS — I10 Essential (primary) hypertension: Secondary | ICD-10-CM | POA: Diagnosis not present

## 2015-07-09 DIAGNOSIS — X501XXA Overexertion from prolonged static or awkward postures, initial encounter: Secondary | ICD-10-CM | POA: Diagnosis not present

## 2015-07-09 NOTE — ED Notes (Signed)
Pt says he had stents placed in right leg on 07/06/15; about 1 hour ago he "turned wrong" getting out of bed tonight; now with pain from right groin radiating down the front of leg;

## 2015-07-10 ENCOUNTER — Encounter: Payer: Self-pay | Admitting: Vascular Surgery

## 2015-07-10 ENCOUNTER — Emergency Department
Admission: EM | Admit: 2015-07-10 | Discharge: 2015-07-10 | Disposition: A | Discharge status: Home / Self Care | Payer: BLUE CROSS/BLUE SHIELD | Attending: Emergency Medicine | Admitting: Emergency Medicine

## 2015-07-10 DIAGNOSIS — M79604 Pain in right leg: Secondary | ICD-10-CM

## 2015-07-10 MED ORDER — DIAZEPAM 2 MG PO TABS
2.0000 mg | ORAL_TABLET | Freq: Once | ORAL | Status: AC
  Administered 2015-07-10: 2 mg via ORAL
  Filled 2015-07-10: qty 1

## 2015-07-10 MED ORDER — MORPHINE SULFATE (PF) 4 MG/ML IV SOLN
4.0000 mg | Freq: Once | INTRAVENOUS | Status: AC
  Administered 2015-07-10: 4 mg via INTRAMUSCULAR
  Filled 2015-07-10: qty 1

## 2015-07-10 MED ORDER — OXYCODONE-ACETAMINOPHEN 5-325 MG PO TABS: 1.0000 | ORAL_TABLET | ORAL | Status: DC | PRN

## 2015-07-10 NOTE — ED Notes (Signed)
Patient with no complaints at this time. Respirations even and unlabored. Skin warm/dry. Discharge instructions reviewed with patient at this time. Patient given opportunity to voice concerns/ask questions. Patient discharged at this time and left Emergency Department, via wheelchair.   

## 2015-07-10 NOTE — Discharge Instructions (Signed)
1. You may take Percocet as needed for pain (#15). 2. Continue to apply ice over affected area several times daily as this seems to improve your pain. 3. Return to the ER for worsening symptoms, persistent vomiting, discoloration or cold temperature in your right leg, or other concerns.  Musculoskeletal Pain Musculoskeletal pain is muscle and boney aches and pains. These pains can occur in any part of the body. Your caregiver may treat you without knowing the cause of the pain. They may treat you if blood or urine tests, X-rays, and other tests were normal.  CAUSES There is often not a definite cause or reason for these pains. These pains may be caused by a type of germ (virus). The discomfort may also come from overuse. Overuse includes working out too hard when your body is not fit. Boney aches also come from weather changes. Bone is sensitive to atmospheric pressure changes. HOME CARE INSTRUCTIONS   Ask when your test results will be ready. Make sure you get your test results.  Only take over-the-counter or prescription medicines for pain, discomfort, or fever as directed by your caregiver. If you were given medications for your condition, do not drive, operate machinery or power tools, or sign legal documents for 24 hours. Do not drink alcohol. Do not take sleeping pills or other medications that may interfere with treatment.  Continue all activities unless the activities cause more pain. When the pain lessens, slowly resume normal activities. Gradually increase the intensity and duration of the activities or exercise.  During periods of severe pain, bed rest may be helpful. Lay or sit in any position that is comfortable.  Putting ice on the injured area.  Put ice in a bag.  Place a towel between your skin and the bag.  Leave the ice on for 15 to 20 minutes, 3 to 4 times a day.  Follow up with your caregiver for continued problems and no reason can be found for the pain. If the pain  becomes worse or does not go away, it may be necessary to repeat tests or do additional testing. Your caregiver may need to look further for a possible cause. SEEK IMMEDIATE MEDICAL CARE IF:  You have pain that is getting worse and is not relieved by medications.  You develop chest pain that is associated with shortness or breath, sweating, feeling sick to your stomach (nauseous), or throw up (vomit).  Your pain becomes localized to the abdomen.  You develop any new symptoms that seem different or that concern you. MAKE SURE YOU:   Understand these instructions.  Will watch your condition.  Will get help right away if you are not doing well or get worse.   This information is not intended to replace advice given to you by your health care provider. Make sure you discuss any questions you have with your health care provider.   Document Released: 05/27/2005 Document Revised: 08/19/2011 Document Reviewed: 01/29/2013 Elsevier Interactive Patient Education 2016 [employer].  Cryotherapy Cryotherapy means treatment with cold. Ice or gel packs can be used to reduce both pain and swelling. Ice is the most helpful within the first 24 to 48 hours after an injury or flare-up from overusing a muscle or joint. Sprains, strains, spasms, burning pain, shooting pain, and aches can all be eased with ice. Ice can also be used when recovering from surgery. Ice is effective, has very few side effects, and is safe for most people to use. PRECAUTIONS  Ice is not  a safe treatment option for people with:  Raynaud phenomenon. This is a condition affecting small blood vessels in the extremities. Exposure to cold may cause your problems to return.  Cold hypersensitivity. There are many forms of cold hypersensitivity, including:  Cold urticaria. Red, itchy hives appear on the skin when the tissues begin to warm after being iced.  Cold erythema. This is a red, itchy rash caused by exposure to cold.  Cold  hemoglobinuria. Red blood cells break down when the tissues begin to warm after being iced. The hemoglobin that carry oxygen are passed into the urine because they cannot combine with blood proteins fast enough.  Numbness or altered sensitivity in the area being iced. If you have any of the following conditions, do not use ice until you have discussed cryotherapy with your caregiver:  Heart conditions, such as arrhythmia, angina, or chronic heart disease.  High blood pressure.  Healing wounds or open skin in the area being iced.  Current infections.  Rheumatoid arthritis.  Poor circulation.  Diabetes. Ice slows the blood flow in the region it is applied. This is beneficial when trying to stop inflamed tissues from spreading irritating chemicals to surrounding tissues. However, if you expose your skin to cold temperatures for too long or without the proper protection, you can damage your skin or nerves. Watch for signs of skin damage due to cold. HOME CARE INSTRUCTIONS Follow these tips to use ice and cold packs safely.  Place a dry or damp towel between the ice and skin. A damp towel will cool the skin more quickly, so you may need to shorten the time that the ice is used.  For a more rapid response, add gentle compression to the ice.  Ice for no more than 10 to 20 minutes at a time. The bonier the area you are icing, the less time it will take to get the benefits of ice.  Check your skin after 5 minutes to make sure there are no signs of a poor response to cold or skin damage.  Rest 20 minutes or more between uses.  Once your skin is numb, you can end your treatment. You can test numbness by very lightly touching your skin. The touch should be so light that you do not see the skin dimple from the pressure of your fingertip. When using ice, most people will feel these normal sensations in this order: cold, burning, aching, and numbness.  Do not use ice on someone who cannot  communicate their responses to pain, such as small children or people with dementia. HOW TO MAKE AN ICE PACK Ice packs are the most common way to use ice therapy. Other methods include ice massage, ice baths, and cryosprays. Muscle creams that cause a cold, tingly feeling do not offer the same benefits that ice offers and should not be used as a substitute unless recommended by your caregiver. To make an ice pack, do one of the following:  Place crushed ice or a bag of frozen vegetables in a sealable plastic bag. Squeeze out the excess air. Place this bag inside another plastic bag. Slide the bag into a pillowcase or place a damp towel between your skin and the bag.  Mix 3 parts water with 1 part rubbing alcohol. Freeze the mixture in a sealable plastic bag. When you remove the mixture from the freezer, it will be slushy. Squeeze out the excess air. Place this bag inside another plastic bag. Slide the bag into a pillowcase  or place a damp towel between your skin and the bag. SEEK MEDICAL CARE IF:  You develop white spots on your skin. This may give the skin a blotchy (mottled) appearance.  Your skin turns blue or pale.  Your skin becomes waxy or hard.  Your swelling gets worse. MAKE SURE YOU:   Understand these instructions.  Will watch your condition.  Will get help right away if you are not doing well or get worse.   This information is not intended to replace advice given to you by your health care provider. Make sure you discuss any questions you have with your health care provider.   Document Released: 01/21/2011 Document Revised: 06/17/2014 Document Reviewed: 01/21/2011 Elsevier Interactive Patient Education [employer].

## 2015-07-10 NOTE — ED Provider Notes (Signed)
[facility] Emergency Department Provider Note  ____________________________________________  Time seen: Approximately 2:23 AM  I have reviewed the triage vital signs and the nursing notes.   HISTORY  Chief Complaint Leg Pain    HPI [name] [name] is a 55 y.o. male who presents to the ED from home with a chief complaint of right leg pain. Patient underwent PCTA of his right lower extremity 1/26 for claudication. Tonight's patient was attempting to get out of bed, twisted and stepped down and felt pain from his right groin radiating down his leg. Did not strike floor. Complains of throbbing-type pain currently in his right groin. Denies associated chest pain, shortness of breath, fever, chills, abdominal pain, nausea, vomiting, diarrhea. Nothing makes this pain better. Movement makes his pain worse.  Past Medical History  Diagnosis Date  . Peripheral vascular disease ([place])   . Diabetes mellitus without complication ([place])   . Morbid obesity ([place])   . Syncope and collapse   . Chronic diastolic CHF (congestive heart failure) ([place])     a. 02/2013 EF 50% by LV gram.  . Asthma   . Hernia   . PAD (peripheral artery disease) ([place])     a. Angiography in March of 2012 showed 70-80% proximal right SFA stenosis status post self-expanding stent placement (6X60 mm). Angiography in April 2012 showed proximal left SFA occlusion status post self-expanding stent placement (7 X unkonw length). Both done by Dr. Lucky Cowboy.  . Coronary artery disease     a. Status post LAD PCI in 2013 at [facility];  b. October 2014 - Nuclear stress test showed inferior wall defect->Cath: patent LAD stent with 90% stenosis in the proximal RCA ( 2.5 x 23 mm DES), EF 50%;  c. 02/2014 MV w/ large inferior scar->Cath: LAD 48m, LCX 1m, 20d, RCA 20p/m, patent stent->Med Rx.  [name] Hyperlipidemia   . Hypertension   . Tobacco use     a. 75+ yr hx - still smoking 1ppd, down from 2 ppd.  [name] COPD (chronic obstructive pulmonary  disease) ([place])   . Neuropathy ([place])   . Varicose vein   . Atherosclerosis   . Sleep apnea   . BPH (benign prostatic hyperplasia)   . Arthritis     Patient Active Problem List   Diagnosis Date Noted  . Morbid obesity ([place]) 11/17/2014  . PAD (peripheral artery disease) ([place])   . Coronary artery disease   . Hyperlipidemia   . Hypertension   . Tobacco use     Past Surgical History  Procedure Laterality Date  . Appendectomy    . Peripheral arterial stent graft      x2 left/right  . Clavicle surgery    . Hernia repair    . Cardiac catheterization  10/14    [facility]; X1 STENT PROXIMAL RCA  . Cardiac catheterization  02/2010    [facility]  . Cardiac catheterization  02/21/2014    [facility]  . Abdominal aortagram N/A 06/23/2013    Procedure: ABDOMINAL Maxcine Ham;  Surgeon: Wellington Hampshire, MD;  Location: [facility] CATH LAB;  Service: Cardiovascular;  Laterality: N/A;  . Peripheral vascular catheterization N/A 07/06/2015    Procedure: Abdominal Aortogram w/Lower Extremity;  Surgeon: Algernon Huxley, MD;  Location: [place] CV LAB;  Service: Cardiovascular;  Laterality: N/A;  . Peripheral vascular catheterization  07/06/2015    Procedure: Lower Extremity Intervention;  Surgeon: Algernon Huxley, MD;  Location: [place] CV LAB;  Service: Cardiovascular;;    Current Outpatient Rx  Name  Route  Sig  Dispense  Refill  . oxyCODONE-acetaminophen (PERCOCET/ROXICET) 5-325 MG tablet   Oral   Take 1 tablet by mouth every 4 (four) hours as needed for severe pain.         [name] albuterol (PROVENTIL HFA;VENTOLIN HFA) 108 (90 BASE) MCG/ACT inhaler   Inhalation   Inhale 2 puffs into the lungs every 6 (six) hours as needed for wheezing or shortness of breath.   1 Inhaler   2   . aspirin EC 81 MG tablet   Oral   Take 1 tablet (81 mg total) by mouth daily.   90 tablet   3   . atorvastatin (LIPITOR) 40 MG tablet   Oral   Take 1 tablet (40 mg total) by mouth daily.   30 tablet   6   . cilostazol (PLETAL) 100 MG  tablet   Oral   Take 1 tablet (100 mg total) by mouth 2 (two) times daily.   60 tablet   6   . clopidogrel (PLAVIX) 75 MG tablet   Oral   Take 1 tablet (75 mg total) by mouth daily.   30 tablet   11   . gabapentin (NEURONTIN) 800 MG tablet   Oral   Take 800 mg by mouth 3 (three) times daily.         [name] HYDROcodone-acetaminophen (NORCO/VICODIN) 5-325 MG tablet   Oral   Take 1 tablet by mouth every 6 (six) hours as needed.       0   . EXPIRED: Insulin Glargine (LANTUS SOLOSTAR) 100 UNIT/ML Solostar Pen   Subcutaneous   Inject 45 Units into the skin 2 (two) times daily. Per sliding scale         . meclizine (ANTIVERT) 25 MG tablet   Oral   Take 1 tablet (25 mg total) by mouth 3 (three) times daily as needed for dizziness.   30 tablet   0   . nitroGLYCERIN (NITRODUR - DOSED IN MG/24 HR) 0.4 mg/hr patch   Transdermal   Place 0.4 mg onto the skin daily.          . orphenadrine (NORFLEX) 100 MG tablet   Oral   Take 1 tablet by mouth 2 (two) times daily as needed.      1     Allergies Review of patient's allergies indicates no known allergies.  Family History  Problem Relation Age of Onset  . Heart attack Father   . Heart disease Father   . Hypertension Father   . Hyperlipidemia Father   . Hypertension Mother   . Hyperlipidemia Mother     Social History Social History  Substance Use Topics  . Smoking status: Current Every Day Smoker -- 1.00 packs/day for 41 years    Types: Cigarettes  . Smokeless tobacco: None  . Alcohol Use: No    Review of Systems Constitutional: No fever/chills Eyes: No visual changes. ENT: No sore throat. Cardiovascular: Denies chest pain. Respiratory: Denies shortness of breath. Gastrointestinal: No abdominal pain.  No nausea, no vomiting.  No diarrhea.  No constipation. Genitourinary: Negative for dysuria. Musculoskeletal: Positive for right leg pain. Negative for back pain. Skin: Negative for rash. Neurological: Negative  for headaches, focal weakness or numbness.  10-point ROS otherwise negative.  ____________________________________________   PHYSICAL EXAM:  VITAL SIGNS: ED Triage Vitals  Enc Vitals Group     BP 07/09/15 2332 141/69 mmHg     Pulse --      Resp  07/09/15 2332 18     Temp 07/09/15 2332 98.4 F (36.9 C)     Temp Source 07/09/15 2332 Oral     SpO2 07/09/15 2332 98 %     Weight 07/09/15 2332 236 lb (107.049 kg)     Height 07/09/15 2332 5\' 7"  (1.702 m)     Head Cir --      Peak Flow --      Pain Score 07/09/15 2333 8     Pain Loc --      Pain Edu? --      Excl. in [place]? --     Constitutional: Alert and oriented. Well appearing and in no acute distress. Eyes: Conjunctivae are normal. PERRL. EOMI. Head: Atraumatic. Nose: No congestion/rhinnorhea. Mouth/Throat: Mucous membranes are moist.  Oropharynx non-erythematous. Neck: No stridor.   Cardiovascular: Normal rate, regular rhythm. Grossly normal heart sounds.  Good peripheral circulation. Respiratory: Normal respiratory effort.  No retractions. Lungs CTAB. Gastrointestinal: Soft and nontender. No distention. No abdominal bruits. No CVA tenderness. Musculoskeletal: Left groin area with dressing from procedure. 2+ femoral pulses. Right lower extremity: 2+ femoral and distal pulses. Mild tenderness to inner thigh. There is no visible external evidence of bruising. Leg is symmetrically warm without evidence for ischemia. Calf is supple without evidence for compartment syndrome.  No joint effusions. Neurologic:  Normal speech and language. No gross focal neurologic deficits are appreciated. Skin:  Skin is warm, dry and intact. No rash noted. Psychiatric: Mood and affect are normal. Speech and behavior are normal.  ____________________________________________   LABS (all labs ordered are listed, but only abnormal results are displayed)  Labs Reviewed - No data to  display ____________________________________________  EKG  None ____________________________________________  RADIOLOGY  None ____________________________________________   PROCEDURES  Procedure(s) performed: None  Critical Care performed: No  ____________________________________________   INITIAL IMPRESSION / ASSESSMENT AND PLAN / ED COURSE  Pertinent labs & imaging results that were available during my care of the patient were reviewed by me and considered in my medical decision making (see chart for details).  55 year old male who complains of right groin pain after twisting to get out of bed. He is recently s/p RLE intervention for claudication. Leg is symmetrically warm with palpable pulses. I discussed with patient and spouse it is extremely unlikely that he would have injured or displaced his stent given low mechanism of injury. More likely he suffered a muscle strain as he is now experiencing mild muscle cramps as well. Especially given that patient is a diabetic, I am hesitant to obtain a CT angiogram given his recent dye load from his procedure. Will try IM analgesia with muscle relaxer and reassess. Patient and spouse are agreeable to this plan. Of note, patient reports ice pack applied to his groin has already started to improve his pain.  ----------------------------------------- 4:55 AM on 07/10/2015 -----------------------------------------  Patient is much improved and sleeping comfortably. He will call Dr. Bunnie Domino office in the morning to schedule a follow-up appointment in the next 1-2 days. Strict return precautions given. Patient spouse verbalize understanding and agree with plan of care. ____________________________________________   FINAL CLINICAL IMPRESSION(S) / ED DIAGNOSES  Final diagnoses:  Pain of right lower extremity      Paulette Blanch, MD 07/10/15 [phone]

## 2015-07-13 ENCOUNTER — Ambulatory Visit: Payer: BLUE CROSS/BLUE SHIELD | Admitting: Cardiovascular Disease

## 2015-07-29 ENCOUNTER — Emergency Department
Admission: EM | Admit: 2015-07-29 | Discharge: 2015-07-29 | Disposition: A | Discharge status: Home / Self Care | Payer: BLUE CROSS/BLUE SHIELD | Attending: Emergency Medicine | Admitting: Emergency Medicine

## 2015-07-29 ENCOUNTER — Emergency Department: Payer: BLUE CROSS/BLUE SHIELD

## 2015-07-29 ENCOUNTER — Encounter: Payer: Self-pay | Admitting: Emergency Medicine

## 2015-07-29 DIAGNOSIS — X58XXXA Exposure to other specified factors, initial encounter: Secondary | ICD-10-CM | POA: Insufficient documentation

## 2015-07-29 DIAGNOSIS — Y9389 Activity, other specified: Secondary | ICD-10-CM | POA: Insufficient documentation

## 2015-07-29 DIAGNOSIS — E119 Type 2 diabetes mellitus without complications: Secondary | ICD-10-CM | POA: Diagnosis not present

## 2015-07-29 DIAGNOSIS — I10 Essential (primary) hypertension: Secondary | ICD-10-CM | POA: Insufficient documentation

## 2015-07-29 DIAGNOSIS — Y990 Civilian activity done for income or pay: Secondary | ICD-10-CM | POA: Insufficient documentation

## 2015-07-29 DIAGNOSIS — Y9289 Other specified places as the place of occurrence of the external cause: Secondary | ICD-10-CM | POA: Diagnosis not present

## 2015-07-29 DIAGNOSIS — F17210 Nicotine dependence, cigarettes, uncomplicated: Secondary | ICD-10-CM | POA: Diagnosis not present

## 2015-07-29 DIAGNOSIS — W19XXXA Unspecified fall, initial encounter: Secondary | ICD-10-CM

## 2015-07-29 DIAGNOSIS — S79911A Unspecified injury of right hip, initial encounter: Secondary | ICD-10-CM | POA: Insufficient documentation

## 2015-07-29 MED ORDER — KETOROLAC TROMETHAMINE 30 MG/ML IJ SOLN
30.0000 mg | Freq: Once | INTRAMUSCULAR | Status: AC
  Administered 2015-07-29: 30 mg via INTRAVENOUS

## 2015-07-29 MED ORDER — KETOROLAC TROMETHAMINE 30 MG/ML IJ SOLN
INTRAMUSCULAR | Status: AC
  Administered 2015-07-29: 30 mg via INTRAVENOUS
  Filled 2015-07-29: qty 1

## 2015-07-29 MED ORDER — MORPHINE SULFATE (PF) 4 MG/ML IV SOLN
4.0000 mg | Freq: Once | INTRAVENOUS | Status: AC
  Administered 2015-07-29: 4 mg via INTRAVENOUS
  Filled 2015-07-29: qty 1

## 2015-07-29 MED ORDER — ONDANSETRON HCL 4 MG/2ML IJ SOLN
4.0000 mg | Freq: Once | INTRAMUSCULAR | Status: AC
  Administered 2015-07-29: 4 mg via INTRAVENOUS
  Filled 2015-07-29: qty 2

## 2015-07-29 NOTE — ED Notes (Signed)
Patient transported to X-ray 

## 2015-07-29 NOTE — ED Notes (Signed)
States at 15 today was working on truck and when he got up felt R hip pop out of joint. 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 States he can sometimes put it in himself but could not this time.

## 2015-11-12 ENCOUNTER — Emergency Department
Admission: EM | Admit: 2015-11-12 | Discharge: 2015-11-12 | Disposition: A | Discharge status: Home / Self Care | Payer: BLUE CROSS/BLUE SHIELD | Attending: Emergency Medicine | Admitting: Emergency Medicine

## 2015-11-12 ENCOUNTER — Emergency Department: Payer: BLUE CROSS/BLUE SHIELD

## 2015-11-12 DIAGNOSIS — I5032 Chronic diastolic (congestive) heart failure: Secondary | ICD-10-CM | POA: Diagnosis not present

## 2015-11-12 DIAGNOSIS — Z794 Long term (current) use of insulin: Secondary | ICD-10-CM | POA: Insufficient documentation

## 2015-11-12 DIAGNOSIS — E785 Hyperlipidemia, unspecified: Secondary | ICD-10-CM | POA: Insufficient documentation

## 2015-11-12 DIAGNOSIS — I2510 Atherosclerotic heart disease of native coronary artery without angina pectoris: Secondary | ICD-10-CM | POA: Insufficient documentation

## 2015-11-12 DIAGNOSIS — J209 Acute bronchitis, unspecified: Secondary | ICD-10-CM | POA: Insufficient documentation

## 2015-11-12 DIAGNOSIS — J40 Bronchitis, not specified as acute or chronic: Secondary | ICD-10-CM

## 2015-11-12 DIAGNOSIS — S60561A Insect bite (nonvenomous) of right hand, initial encounter: Secondary | ICD-10-CM | POA: Diagnosis not present

## 2015-11-12 DIAGNOSIS — Z79899 Other long term (current) drug therapy: Secondary | ICD-10-CM | POA: Insufficient documentation

## 2015-11-12 DIAGNOSIS — R0602 Shortness of breath: Secondary | ICD-10-CM | POA: Diagnosis present

## 2015-11-12 DIAGNOSIS — Y939 Activity, unspecified: Secondary | ICD-10-CM | POA: Insufficient documentation

## 2015-11-12 DIAGNOSIS — Z7982 Long term (current) use of aspirin: Secondary | ICD-10-CM | POA: Diagnosis not present

## 2015-11-12 DIAGNOSIS — Y999 Unspecified external cause status: Secondary | ICD-10-CM | POA: Insufficient documentation

## 2015-11-12 DIAGNOSIS — J45909 Unspecified asthma, uncomplicated: Secondary | ICD-10-CM | POA: Diagnosis not present

## 2015-11-12 DIAGNOSIS — I110 Hypertensive heart disease with heart failure: Secondary | ICD-10-CM | POA: Diagnosis not present

## 2015-11-12 DIAGNOSIS — M1990 Unspecified osteoarthritis, unspecified site: Secondary | ICD-10-CM | POA: Insufficient documentation

## 2015-11-12 DIAGNOSIS — E1140 Type 2 diabetes mellitus with diabetic neuropathy, unspecified: Secondary | ICD-10-CM | POA: Diagnosis not present

## 2015-11-12 DIAGNOSIS — Y929 Unspecified place or not applicable: Secondary | ICD-10-CM | POA: Diagnosis not present

## 2015-11-12 DIAGNOSIS — W57XXXA Bitten or stung by nonvenomous insect and other nonvenomous arthropods, initial encounter: Secondary | ICD-10-CM | POA: Diagnosis not present

## 2015-11-12 DIAGNOSIS — E1151 Type 2 diabetes mellitus with diabetic peripheral angiopathy without gangrene: Secondary | ICD-10-CM | POA: Diagnosis not present

## 2015-11-12 DIAGNOSIS — F17210 Nicotine dependence, cigarettes, uncomplicated: Secondary | ICD-10-CM | POA: Diagnosis not present

## 2015-11-12 LAB — CBC WITH DIFFERENTIAL/PLATELET
Basophils Absolute: 0.1 10*3/uL (ref 0–0.1)
Basophils Relative: 1 %
Eosinophils Absolute: 0.2 10*3/uL (ref 0–0.7)
Eosinophils Relative: 2 %
HCT: 42.7 % (ref 40.0–52.0)
Hemoglobin: 14.4 g/dL (ref 13.0–18.0)
Lymphocytes Relative: 29 %
Lymphs Abs: 2.1 10*3/uL (ref 1.0–3.6)
MCH: 29.4 pg (ref 26.0–34.0)
MCHC: 33.7 g/dL (ref 32.0–36.0)
MCV: 87.1 fL (ref 80.0–100.0)
Monocytes Absolute: 0.5 10*3/uL (ref 0.2–1.0)
Monocytes Relative: 7 %
Neutro Abs: 4.3 10*3/uL (ref 1.4–6.5)
Neutrophils Relative %: 61 %
Platelets: 249 10*3/uL (ref 150–440)
RBC: 4.91 MIL/uL (ref 4.40–5.90)
RDW: 13.9 % (ref 11.5–14.5)
WBC: 7.1 10*3/uL (ref 3.8–10.6)

## 2015-11-12 LAB — BLOOD GAS, VENOUS
Acid-Base Excess: 3.8 mmol/L — ABNORMAL HIGH (ref 0.0–3.0)
Bicarbonate: 31.3 mEq/L — ABNORMAL HIGH (ref 21.0–28.0)
O2 Saturation: 80.7 %
Patient temperature: 37
pCO2, Ven: 58 mmHg (ref 44.0–60.0)
pH, Ven: 7.34 (ref 7.320–7.430)
pO2, Ven: 48 mmHg — ABNORMAL HIGH (ref 31.0–45.0)

## 2015-11-12 LAB — BRAIN NATRIURETIC PEPTIDE: B Natriuretic Peptide: 37 pg/mL (ref 0.0–100.0)

## 2015-11-12 LAB — BASIC METABOLIC PANEL
Anion gap: 7 (ref 5–15)
BUN: 15 mg/dL (ref 6–20)
CO2: 28 mmol/L (ref 22–32)
Calcium: 9.1 mg/dL (ref 8.9–10.3)
Chloride: 99 mmol/L — ABNORMAL LOW (ref 101–111)
Creatinine, Ser: 0.84 mg/dL (ref 0.61–1.24)
GFR calc Af Amer: >60 mL/min (ref >60)
GFR calc non Af Amer: >60 mL/min (ref >60)
Glucose, Bld: 288 mg/dL — ABNORMAL HIGH (ref 65–99)
Potassium: 3.6 mmol/L (ref 3.5–5.1)
Sodium: 134 mmol/L — ABNORMAL LOW (ref 135–145)

## 2015-11-12 LAB — TROPONIN I: Troponin I: 0.03 ng/mL (ref <0.031)

## 2015-11-12 MED ORDER — IPRATROPIUM-ALBUTEROL 0.5-2.5 (3) MG/3ML IN SOLN
3.0000 mL | Freq: Once | RESPIRATORY_TRACT | Status: AC
  Administered 2015-11-12: 3 mL via RESPIRATORY_TRACT
  Filled 2015-11-12: qty 3

## 2015-11-12 MED ORDER — DOXYCYCLINE HYCLATE 100 MG PO CAPS: 100.0000 mg | ORAL_CAPSULE | Freq: Two times a day (BID) | ORAL | Status: DC

## 2015-11-12 MED ORDER — METHYLPREDNISOLONE SODIUM SUCC 125 MG IJ SOLR
125.0000 mg | Freq: Once | INTRAMUSCULAR | Status: AC
  Administered 2015-11-12: 125 mg via INTRAVENOUS
  Filled 2015-11-12: qty 2

## 2015-11-12 MED ORDER — HYDROCOD POLST-CPM POLST ER 10-8 MG/5ML PO SUER: 5.0000 mL | Freq: Two times a day (BID) | ORAL | Status: DC

## 2015-11-12 NOTE — ED Notes (Signed)
Discussed discharge instructions, prescriptions, and follow-up care with patient. No questions or concerns at this time. Pt stable at discharge.  
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
## 2015-11-12 NOTE — Discharge Instructions (Signed)
Tick Bite Information Ticks are insects that attach themselves to the skin and draw blood for food. There are various types of ticks. Common types include wood ticks and deer ticks. Most ticks live in shrubs and grassy areas. Ticks can climb onto your body when you make contact with leaves or grass where the tick is waiting. The most common places on the body for ticks to attach themselves are the scalp, neck, armpits, waist, and groin. Most tick bites are harmless, but sometimes ticks carry germs that cause diseases. These germs can be spread to a person during the tick's feeding process. The chance of a disease spreading through a tick bite depends on:   The type of tick.  Time of year.   How long the tick is attached.   Geographic location.  HOW CAN YOU PREVENT TICK BITES? Take these steps to help prevent tick bites when you are outdoors:  Wear protective clothing. Long sleeves and long pants are best.   Wear white clothes so you can see ticks more easily.  Tuck your pant legs into your socks.   If walking on a trail, stay in the middle of the trail to avoid brushing against bushes.  Avoid walking through areas with long grass.  Put insect repellent on all exposed skin and along boot tops, pant legs, and sleeve cuffs.   Check clothing, hair, and skin repeatedly and before going inside.   Brush off any ticks that are not attached.  Take a shower or bath as soon as possible after being outdoors.  WHAT IS THE PROPER WAY TO REMOVE A TICK? Ticks should be removed as soon as possible to help prevent diseases caused by tick bites. 1. If latex gloves are available, put them on before trying to remove a tick.  2. Using fine-point tweezers, grasp the tick as close to the skin as possible. You may also use curved forceps or a tick removal tool. Grasp the tick as close to its head as possible. Avoid grasping the tick on its body. 3. Pull gently with steady upward pressure until  the tick lets go. Do not twist the tick or jerk it suddenly. This may break off the tick's head or mouth parts. 4. Do not squeeze or crush the tick's body. This could force disease-carrying fluids from the tick into your body.  5. After the tick is removed, wash the bite area and your hands with soap and water or other disinfectant such as alcohol. 6. Apply a small amount of antiseptic cream or ointment to the bite site.  7. Wash and disinfect any instruments that were used.  Do not try to remove a tick by applying a hot match, petroleum jelly, or fingernail polish to the tick. These methods do not work and may increase the chances of disease being spread from the tick bite.  WHEN SHOULD YOU SEEK MEDICAL CARE? Contact your health care provider if you are unable to remove a tick from your skin or if a part of the tick breaks off and is stuck in the skin.  After a tick bite, you need to be aware of signs and symptoms that could be related to diseases spread by ticks. Contact your health care provider if you develop any of the following in the days or weeks after the tick bite:  Unexplained fever.  Rash. A circular rash that appears days or weeks after the tick bite may indicate the possibility of Lyme disease. The rash may resemble  a target with a bull's-eye and may occur at a different part of your body than the tick bite.  Redness and swelling in the area of the tick bite.   Tender, swollen lymph glands.   Diarrhea.   Weight loss.   Cough.   Fatigue.   Muscle, joint, or bone pain.   Abdominal pain.   Headache.   Lethargy or a change in your level of consciousness.  Difficulty walking or moving your legs.   Numbness in the legs.   Paralysis.  Shortness of breath.   Confusion.   Repeated vomiting.    This information is not intended to replace advice given to you by your health care provider. Make sure you discuss any questions you have with your health  care provider.   Document Released: 05/24/2000 Document Revised: 06/17/2014 Document Reviewed: 11/04/2012 Elsevier Interactive Patient Education 2016 Else[employer] Acute Bronchitis Bronchitis is inflammation of the airways that extend from the windpipe into the lungs (bronchi). The inflammation often causes mucus to develop. This leads to a cough, which is the most common symptom of bronchitis.  In acute bronchitis, the condition usually develops suddenly and goes away over time, usually in a couple weeks. Smoking, allergies, and asthma can make bronchitis worse. Repeated episodes of bronchitis may cause further lung problems.  CAUSES Acute bronchitis is most often caused by the same virus that causes a cold. The virus can spread from person to person (contagious) through coughing, sneezing, and touching contaminated objects. SIGNS AND SYMPTOMS   Cough.   Fever.   Coughing up mucus.   Body aches.   Chest congestion.   Chills.   Shortness of breath.   Sore throat.  DIAGNOSIS  Acute bronchitis is usually diagnosed through a physical exam. Your health care provider will also ask you questions about your medical history. Tests, such as chest X-rays, are sometimes done to rule out other conditions.  TREATMENT  Acute bronchitis usually goes away in a couple weeks. Oftentimes, no medical treatment is necessary. Medicines are sometimes given for relief of fever or cough. Antibiotic medicines are usually not needed but may be prescribed in certain situations. In some cases, an inhaler may be recommended to help reduce shortness of breath and control the cough. A cool mist vaporizer may also be used to help thin bronchial secretions and make it easier to clear the chest.  HOME CARE INSTRUCTIONS  Get plenty of rest.   Drink enough fluids to keep your urine clear or pale yellow (unless you have a medical condition that requires fluid restriction). Increasing fluids may help thin your  respiratory secretions (sputum) and reduce chest congestion, and it will prevent dehydration.   Take medicines only as directed by your health care provider.  If you were prescribed an antibiotic medicine, finish it all even if you start to feel better.  Avoid smoking and secondhand smoke. Exposure to cigarette smoke or irritating chemicals will make bronchitis worse. If you are a smoker, consider using nicotine gum or skin patches to help control withdrawal symptoms. Quitting smoking will help your lungs heal faster.   Reduce the chances of another bout of acute bronchitis by washing your hands frequently, avoiding people with cold symptoms, and trying not to touch your hands to your mouth, nose, or eyes.   Keep all follow-up visits as directed by your health care provider.  SEEK MEDICAL CARE IF: Your symptoms do not improve after 1 week of treatment.  SEEK IMMEDIATE MEDICAL CARE IF:  8. You develop an increased fever or chills.  9. You have chest pain.  10. You have severe shortness of breath. 11. You have bloody sputum.  12. You develop dehydration. 13. You faint or repeatedly feel like you are going to pass out. 14. You develop repeated vomiting. 15. You develop a severe headache. MAKE SURE YOU:   Understand these instructions.  Will watch your condition.  Will get help right away if you are not doing well or get worse.   This information is not intended to replace advice given to you by your health care provider. Make sure you discuss any questions you have with your health care provider.   Document Released: 07/04/2004 Document Revised: 06/17/2014 Document Reviewed: 11/17/2012 Elsevier Interactive Patient Education [employer].

## 2015-11-12 NOTE — ED Notes (Signed)
Pt says within the last 2 weeks he's pulled more than 10 ticks out of his skin; c/o increasing weakness, shortness of breath, green sinus drainage, chills, nausea; pt noted to have several areas on right hand that he burned after falling asleep smoking a cigarette; areas are not healing well-swelling,  redness, dried drainage; history of diabetes; has not seen a provider for these wounds

## 2015-11-12 NOTE — ED Notes (Signed)
Phone report given to [place], [place]

## 2015-11-14 LAB — B. BURGDORFI ANTIBODIES: B burgdorferi Ab IgG+IgM: <0.91 {ISR} (ref 0.00–0.90)

## 2015-11-20 ENCOUNTER — Emergency Department: Payer: BLUE CROSS/BLUE SHIELD

## 2015-11-20 ENCOUNTER — Emergency Department
Admission: EM | Admit: 2015-11-20 | Discharge: 2015-11-20 | Disposition: A | Discharge status: Home / Self Care | Payer: BLUE CROSS/BLUE SHIELD | Source: Home / Self Care

## 2015-11-20 DIAGNOSIS — Z5321 Procedure and treatment not carried out due to patient leaving prior to being seen by health care provider: Secondary | ICD-10-CM

## 2015-11-20 DIAGNOSIS — M79604 Pain in right leg: Secondary | ICD-10-CM | POA: Insufficient documentation

## 2015-11-20 DIAGNOSIS — E871 Hypo-osmolality and hyponatremia: Secondary | ICD-10-CM | POA: Diagnosis not present

## 2015-11-20 DIAGNOSIS — L03115 Cellulitis of right lower limb: Secondary | ICD-10-CM | POA: Diagnosis not present

## 2015-11-20 NOTE — ED Notes (Signed)
Pt states he has a stent in the right leg and yesterday began having pain in the groin area radiating down the medial leg, states he has a red streak running from groin to knee.[name] [name]

## 2015-11-21 ENCOUNTER — Encounter: Payer: Self-pay | Admitting: Emergency Medicine

## 2015-11-21 ENCOUNTER — Emergency Department: Payer: BLUE CROSS/BLUE SHIELD

## 2015-11-21 ENCOUNTER — Inpatient Hospital Stay
Admission: EM | Admit: 2015-11-21 | Discharge: 2015-11-22 | DRG: 571 | Disposition: A | Discharge status: Home / Self Care | Payer: BLUE CROSS/BLUE SHIELD | Attending: Internal Medicine | Admitting: Internal Medicine

## 2015-11-21 DIAGNOSIS — I110 Hypertensive heart disease with heart failure: Secondary | ICD-10-CM | POA: Diagnosis present

## 2015-11-21 DIAGNOSIS — F17210 Nicotine dependence, cigarettes, uncomplicated: Secondary | ICD-10-CM | POA: Diagnosis present

## 2015-11-21 DIAGNOSIS — N400 Enlarged prostate without lower urinary tract symptoms: Secondary | ICD-10-CM | POA: Diagnosis present

## 2015-11-21 DIAGNOSIS — I739 Peripheral vascular disease, unspecified: Secondary | ICD-10-CM | POA: Diagnosis present

## 2015-11-21 DIAGNOSIS — L97419 Non-pressure chronic ulcer of right heel and midfoot with unspecified severity: Secondary | ICD-10-CM | POA: Diagnosis present

## 2015-11-21 DIAGNOSIS — M1990 Unspecified osteoarthritis, unspecified site: Secondary | ICD-10-CM | POA: Diagnosis present

## 2015-11-21 DIAGNOSIS — L03115 Cellulitis of right lower limb: Secondary | ICD-10-CM

## 2015-11-21 DIAGNOSIS — S90811A Abrasion, right foot, initial encounter: Secondary | ICD-10-CM

## 2015-11-21 DIAGNOSIS — Z955 Presence of coronary angioplasty implant and graft: Secondary | ICD-10-CM

## 2015-11-21 DIAGNOSIS — I5032 Chronic diastolic (congestive) heart failure: Secondary | ICD-10-CM | POA: Diagnosis present

## 2015-11-21 DIAGNOSIS — T23201A Burn of second degree of right hand, unspecified site, initial encounter: Secondary | ICD-10-CM

## 2015-11-21 DIAGNOSIS — E871 Hypo-osmolality and hyponatremia: Secondary | ICD-10-CM | POA: Diagnosis present

## 2015-11-21 DIAGNOSIS — I2510 Atherosclerotic heart disease of native coronary artery without angina pectoris: Secondary | ICD-10-CM | POA: Diagnosis present

## 2015-11-21 DIAGNOSIS — E11621 Type 2 diabetes mellitus with foot ulcer: Secondary | ICD-10-CM | POA: Diagnosis present

## 2015-11-21 DIAGNOSIS — Z9049 Acquired absence of other specified parts of digestive tract: Secondary | ICD-10-CM | POA: Diagnosis not present

## 2015-11-21 DIAGNOSIS — Z8249 Family history of ischemic heart disease and other diseases of the circulatory system: Secondary | ICD-10-CM | POA: Diagnosis not present

## 2015-11-21 DIAGNOSIS — Z79899 Other long term (current) drug therapy: Secondary | ICD-10-CM | POA: Diagnosis not present

## 2015-11-21 DIAGNOSIS — L0390 Cellulitis, unspecified: Secondary | ICD-10-CM | POA: Diagnosis present

## 2015-11-21 DIAGNOSIS — R739 Hyperglycemia, unspecified: Secondary | ICD-10-CM

## 2015-11-21 DIAGNOSIS — E1140 Type 2 diabetes mellitus with diabetic neuropathy, unspecified: Secondary | ICD-10-CM | POA: Diagnosis present

## 2015-11-21 DIAGNOSIS — G4730 Sleep apnea, unspecified: Secondary | ICD-10-CM | POA: Diagnosis present

## 2015-11-21 DIAGNOSIS — E861 Hypovolemia: Secondary | ICD-10-CM | POA: Diagnosis present

## 2015-11-21 DIAGNOSIS — Z794 Long term (current) use of insulin: Secondary | ICD-10-CM | POA: Diagnosis not present

## 2015-11-21 DIAGNOSIS — Z7982 Long term (current) use of aspirin: Secondary | ICD-10-CM

## 2015-11-21 DIAGNOSIS — E1165 Type 2 diabetes mellitus with hyperglycemia: Secondary | ICD-10-CM | POA: Diagnosis present

## 2015-11-21 DIAGNOSIS — Z9889 Other specified postprocedural states: Secondary | ICD-10-CM | POA: Diagnosis not present

## 2015-11-21 DIAGNOSIS — R6883 Chills (without fever): Secondary | ICD-10-CM

## 2015-11-21 DIAGNOSIS — J449 Chronic obstructive pulmonary disease, unspecified: Secondary | ICD-10-CM | POA: Diagnosis present

## 2015-11-21 LAB — CBC WITH DIFFERENTIAL/PLATELET
Basophils Absolute: 0.1 10*3/uL (ref 0–0.1)
Basophils Relative: 1 %
Eosinophils Absolute: 0.1 10*3/uL (ref 0–0.7)
Eosinophils Relative: 1 %
HCT: 41 % (ref 40.0–52.0)
Hemoglobin: 14 g/dL (ref 13.0–18.0)
Lymphocytes Relative: 17 %
Lymphs Abs: 1.9 10*3/uL (ref 1.0–3.6)
MCH: 29.4 pg (ref 26.0–34.0)
MCHC: 34.1 g/dL (ref 32.0–36.0)
MCV: 86.2 fL (ref 80.0–100.0)
Monocytes Absolute: 0.7 10*3/uL (ref 0.2–1.0)
Monocytes Relative: 6 %
Neutro Abs: 8.4 10*3/uL — ABNORMAL HIGH (ref 1.4–6.5)
Neutrophils Relative %: 75 %
Platelets: 221 10*3/uL (ref 150–440)
RBC: 4.75 MIL/uL (ref 4.40–5.90)
RDW: 14 % (ref 11.5–14.5)
WBC: 11.2 10*3/uL — ABNORMAL HIGH (ref 3.8–10.6)

## 2015-11-21 LAB — COMPREHENSIVE METABOLIC PANEL
ALT: 20 U/L (ref 17–63)
AST: 19 U/L (ref 15–41)
Albumin: 3.5 g/dL (ref 3.5–5.0)
Alkaline Phosphatase: 79 U/L (ref 38–126)
Anion gap: 9 (ref 5–15)
BUN: 10 mg/dL (ref 6–20)
CO2: 28 mmol/L (ref 22–32)
Calcium: 9 mg/dL (ref 8.9–10.3)
Chloride: 93 mmol/L — ABNORMAL LOW (ref 101–111)
Creatinine, Ser: 0.86 mg/dL (ref 0.61–1.24)
GFR calc Af Amer: >60 mL/min (ref >60)
GFR calc non Af Amer: >60 mL/min (ref >60)
Glucose, Bld: 355 mg/dL — ABNORMAL HIGH (ref 65–99)
Potassium: 4.1 mmol/L (ref 3.5–5.1)
Sodium: 130 mmol/L — ABNORMAL LOW (ref 135–145)
Total Bilirubin: 1.1 mg/dL (ref 0.3–1.2)
Total Protein: 7.4 g/dL (ref 6.5–8.1)

## 2015-11-21 LAB — URINALYSIS COMPLETE WITH MICROSCOPIC (ARMC ONLY)
Bacteria, UA: NONE SEEN
Bilirubin Urine: NEGATIVE
Glucose, UA: >500 mg/dL — AB
Ketones, ur: NEGATIVE mg/dL
Leukocytes, UA: NEGATIVE
Nitrite: NEGATIVE
Protein, ur: 30 mg/dL — AB
Specific Gravity, Urine: 1.036 — ABNORMAL HIGH (ref 1.005–1.030)
pH: 6 (ref 5.0–8.0)

## 2015-11-21 LAB — GLUCOSE, CAPILLARY
Glucose-Capillary: 291 mg/dL — ABNORMAL HIGH (ref 65–99)
Glucose-Capillary: 232 mg/dL — ABNORMAL HIGH (ref 65–99)

## 2015-11-21 LAB — C-REACTIVE PROTEIN: CRP: 18.3 mg/dL — ABNORMAL HIGH (ref <1.0)

## 2015-11-21 LAB — SEDIMENTATION RATE: Sed Rate: 41 mm/hr — ABNORMAL HIGH (ref 0–20)

## 2015-11-21 MED ORDER — GABAPENTIN 800 MG PO TABS
800.0000 mg | ORAL_TABLET | Freq: Three times a day (TID) | ORAL | Status: DC
  Filled 2015-11-21 (×2): qty 1

## 2015-11-21 MED ORDER — VANCOMYCIN HCL 10 G IV SOLR
1250.0000 mg | Freq: Once | INTRAVENOUS | Status: DC
  Filled 2015-11-21: qty 1250

## 2015-11-21 MED ORDER — VANCOMYCIN HCL IN DEXTROSE 1-5 GM/200ML-% IV SOLN: 1000.0000 mg | Freq: Three times a day (TID) | INTRAVENOUS | Status: DC

## 2015-11-21 MED ORDER — TETANUS-DIPHTH-ACELL PERTUSSIS 5-2.5-18.5 LF-MCG/0.5 IM SUSP
0.5000 mL | Freq: Once | INTRAMUSCULAR | Status: AC
  Administered 2015-11-21: 0.5 mL via INTRAMUSCULAR
  Filled 2015-11-21: qty 0.5

## 2015-11-21 MED ORDER — METFORMIN HCL 500 MG PO TABS
1000.0000 mg | ORAL_TABLET | Freq: Two times a day (BID) | ORAL | Status: DC
  Administered 2015-11-21 – 2015-11-22 (×2): 1000 mg via ORAL
  Filled 2015-11-21 (×2): qty 2

## 2015-11-21 MED ORDER — HYDROCOD POLST-CPM POLST ER 10-8 MG/5ML PO SUER
5.0000 mL | Freq: Two times a day (BID) | ORAL | Status: DC | PRN
Start: 2015-11-21 — End: 2015-11-22

## 2015-11-21 MED ORDER — CLINDAMYCIN PHOSPHATE 900 MG/50ML IV SOLN
900.0000 mg | Freq: Once | INTRAVENOUS | Status: AC
  Administered 2015-11-21: 900 mg via INTRAVENOUS
  Filled 2015-11-21: qty 50

## 2015-11-21 MED ORDER — ATORVASTATIN CALCIUM 20 MG PO TABS
40.0000 mg | ORAL_TABLET | Freq: Every day | ORAL | Status: DC
  Administered 2015-11-21: 40 mg via ORAL
  Filled 2015-11-21: qty 2

## 2015-11-21 MED ORDER — OXYCODONE-ACETAMINOPHEN 5-325 MG PO TABS
ORAL_TABLET | ORAL | Status: AC
  Filled 2015-11-21: qty 1

## 2015-11-21 MED ORDER — FLUTICASONE FUROATE-VILANTEROL 100-25 MCG/INH IN AEPB
1.0000 | INHALATION_SPRAY | Freq: Every day | RESPIRATORY_TRACT | Status: DC
  Administered 2015-11-22: 1 via RESPIRATORY_TRACT
  Filled 2015-11-21: qty 28

## 2015-11-21 MED ORDER — INSULIN ASPART 100 UNIT/ML ~~LOC~~ SOLN
10.0000 [IU] | Freq: Once | SUBCUTANEOUS | Status: AC
  Administered 2015-11-21: 10 [IU] via SUBCUTANEOUS
  Filled 2015-11-21: qty 10

## 2015-11-21 MED ORDER — ONDANSETRON HCL 4 MG/2ML IJ SOLN: 4.0000 mg | Freq: Four times a day (QID) | INTRAMUSCULAR | Status: DC | PRN

## 2015-11-21 MED ORDER — INSULIN GLARGINE 100 UNIT/ML ~~LOC~~ SOLN
40.0000 [IU] | Freq: Two times a day (BID) | SUBCUTANEOUS | Status: DC
  Administered 2015-11-21: 40 [IU] via SUBCUTANEOUS
  Filled 2015-11-21 (×3): qty 0.4

## 2015-11-21 MED ORDER — INSULIN GLARGINE 100 UNITS/ML SOLOSTAR PEN: 40.0000 [IU] | PEN_INJECTOR | Freq: Two times a day (BID) | SUBCUTANEOUS | Status: DC

## 2015-11-21 MED ORDER — INSULIN ASPART 100 UNIT/ML ~~LOC~~ SOLN
0.0000 [IU] | Freq: Three times a day (TID) | SUBCUTANEOUS | Status: DC
Start: 2015-11-21 — End: 2015-11-22
  Administered 2015-11-21: 5 [IU] via SUBCUTANEOUS
  Filled 2015-11-21: qty 5

## 2015-11-21 MED ORDER — PIPERACILLIN-TAZOBACTAM 3.375 G IVPB 30 MIN
3.3750 g | Freq: Once | INTRAVENOUS | Status: AC
  Administered 2015-11-21: 3.375 g via INTRAVENOUS

## 2015-11-21 MED ORDER — OXYCODONE-ACETAMINOPHEN 5-325 MG PO TABS
1.0000 | ORAL_TABLET | Freq: Once | ORAL | Status: AC
  Administered 2015-11-21: 1 via ORAL
  Filled 2015-11-21: qty 1

## 2015-11-21 MED ORDER — ENOXAPARIN SODIUM 40 MG/0.4ML ~~LOC~~ SOLN
40.0000 mg | SUBCUTANEOUS | Status: DC
  Administered 2015-11-21: 40 mg via SUBCUTANEOUS
  Filled 2015-11-21: qty 0.4

## 2015-11-21 MED ORDER — PIPERACILLIN-TAZOBACTAM 3.375 G IVPB
3.3750 g | Freq: Three times a day (TID) | INTRAVENOUS | Status: DC
  Administered 2015-11-21 – 2015-11-22 (×3): 3.375 g via INTRAVENOUS
  Filled 2015-11-21 (×5): qty 50

## 2015-11-21 MED ORDER — VANCOMYCIN HCL IN DEXTROSE 750-5 MG/150ML-% IV SOLN
750.0000 mg | Freq: Three times a day (TID) | INTRAVENOUS | Status: DC
  Filled 2015-11-21 (×2): qty 150

## 2015-11-21 MED ORDER — OXYCODONE HCL 5 MG PO TABS
5.0000 mg | ORAL_TABLET | ORAL | Status: DC | PRN
Start: 2015-11-21 — End: 2015-11-22
  Administered 2015-11-21 – 2015-11-22 (×3): 5 mg via ORAL
  Filled 2015-11-21 (×4): qty 1

## 2015-11-21 MED ORDER — ACETAMINOPHEN 650 MG RE SUPP: 650.0000 mg | Freq: Four times a day (QID) | RECTAL | Status: DC | PRN

## 2015-11-21 MED ORDER — ALBUTEROL SULFATE HFA 108 (90 BASE) MCG/ACT IN AERS: 2.0000 | INHALATION_SPRAY | Freq: Four times a day (QID) | RESPIRATORY_TRACT | Status: DC | PRN

## 2015-11-21 MED ORDER — ASPIRIN EC 81 MG PO TBEC
81.0000 mg | DELAYED_RELEASE_TABLET | Freq: Every day | ORAL | Status: DC
  Administered 2015-11-22: 81 mg via ORAL
  Filled 2015-11-21: qty 1

## 2015-11-21 MED ORDER — ACETAMINOPHEN 325 MG PO TABS: 650.0000 mg | ORAL_TABLET | Freq: Four times a day (QID) | ORAL | Status: DC | PRN

## 2015-11-21 MED ORDER — MECLIZINE HCL 25 MG PO TABS
25.0000 mg | ORAL_TABLET | Freq: Three times a day (TID) | ORAL | Status: DC | PRN
  Filled 2015-11-21: qty 1

## 2015-11-21 MED ORDER — INSULIN ASPART 100 UNIT/ML ~~LOC~~ SOLN
0.0000 [IU] | Freq: Every day | SUBCUTANEOUS | Status: DC
  Administered 2015-11-21: 2 [IU] via SUBCUTANEOUS
  Filled 2015-11-21: qty 2

## 2015-11-21 MED ORDER — OXYCODONE-ACETAMINOPHEN 5-325 MG PO TABS
1.0000 | ORAL_TABLET | Freq: Once | ORAL | Status: AC
  Administered 2015-11-21: 1 via ORAL

## 2015-11-21 MED ORDER — ONDANSETRON HCL 4 MG PO TABS: 4.0000 mg | ORAL_TABLET | Freq: Four times a day (QID) | ORAL | Status: DC | PRN

## 2015-11-21 MED ORDER — FLUTICASONE PROPIONATE 50 MCG/ACT NA SUSP
2.0000 | Freq: Every day | NASAL | Status: DC
  Filled 2015-11-21: qty 16

## 2015-11-21 MED ORDER — VANCOMYCIN HCL 10 G IV SOLR
1250.0000 mg | Freq: Once | INTRAVENOUS | Status: AC
  Administered 2015-11-21: 1250 mg via INTRAVENOUS
  Filled 2015-11-21: qty 1250

## 2015-11-21 MED ORDER — VANCOMYCIN HCL IN DEXTROSE 750-5 MG/150ML-% IV SOLN
750.0000 mg | Freq: Three times a day (TID) | INTRAVENOUS | Status: DC
  Administered 2015-11-22 (×2): 750 mg via INTRAVENOUS
  Filled 2015-11-21 (×5): qty 150

## 2015-11-21 MED ORDER — GABAPENTIN 400 MG PO CAPS
800.0000 mg | ORAL_CAPSULE | Freq: Three times a day (TID) | ORAL | Status: DC
  Administered 2015-11-21 – 2015-11-22 (×3): 800 mg via ORAL
  Filled 2015-11-21 (×3): qty 2

## 2015-11-21 MED ORDER — CLOPIDOGREL BISULFATE 75 MG PO TABS
75.0000 mg | ORAL_TABLET | Freq: Every day | ORAL | Status: DC
  Administered 2015-11-22: 75 mg via ORAL
  Filled 2015-11-21: qty 1

## 2015-11-21 MED ORDER — SODIUM CHLORIDE 0.9 % IV SOLN
INTRAVENOUS | Status: DC
  Administered 2015-11-21: 18:00:00 via INTRAVENOUS

## 2015-11-21 MED ORDER — ALBUTEROL SULFATE (2.5 MG/3ML) 0.083% IN NEBU: 2.5000 mg | INHALATION_SOLUTION | Freq: Four times a day (QID) | RESPIRATORY_TRACT | Status: DC | PRN

## 2015-11-21 MED ORDER — MORPHINE SULFATE (PF) 2 MG/ML IV SOLN: 2.0000 mg | INTRAVENOUS | Status: DC | PRN

## 2015-11-21 NOTE — ED Notes (Signed)
Pt back in bed at this time; pt ambulates wit no problems.

## 2015-11-21 NOTE — ED Notes (Signed)
Seen by PCP today, sent to ED for evaluation of Cellulitis of right leg.  Patient was seen through ED yesterday and was negative for DVT.

## 2015-11-21 NOTE — ED Notes (Signed)
Pt up the bathroom.

## 2015-11-21 NOTE — H&P (Addendum)
[facility] [place] NAME: [name] [name]    MR#:  [id number]  DATE OF BIRTH:  12-Jul-1960  DATE OF ADMISSION:  11/21/2015  PRIMARY CARE PHYSICIAN: Chrisandra Carota, MD   REQUESTING/REFERRING PHYSICIAN: Dr. Eula Listen  CHIEF COMPLAINT:   Chief Complaint  Patient presents with  . Leg Swelling    HISTORY OF PRESENT ILLNESS:  [name] [name]  is a 55 y.o. male with a known history of CAD status post stent, peripheral arterial disease status post stents, diastolic CHF, diabetes and hypertension, asthma comes to the hospital secondary to 3 day history of right foot erythema and swelling. Patient has diabetes and neuropathy associated with that. He was working in his yard barefooted couple of days ago. He thinks he might of stepped on a stone and hurt himself in the foot. He noticed redness and swelling of his right foot that was spreading up to his knee 2 days ago. Went to see his PCP today, his foot was erythematous and concerning so he was sent to the emergency room. Patient was worried that his leg stent that was put in his right leg recently is being infected. Sugars are 355 today, x-ray does not show any osteomyelitis but subcutaneous fluid collection and the foot. So he is being admitted for IV antibiotics for cellulitis of his right foot. Denies any fevers, has chills at home. Also complains of some nausea associated with that. He has been trying to put ice packs to the swollen leg with not much benefit.  PAST MEDICAL HISTORY:   Past Medical History  Diagnosis Date  . Peripheral vascular disease ([place])   . Diabetes mellitus without complication ([place])   . Morbid obesity ([place])   . Syncope and collapse   . Chronic diastolic CHF (congestive heart failure) ([place])     a. 02/2013 EF 50% by LV gram.  . Asthma   . Hernia   . PAD (peripheral artery disease) ([place])     a. Angiography in March of 2012 showed 70-80% proximal right SFA stenosis  status post self-expanding stent placement (6X60 mm). Angiography in April 2012 showed proximal left SFA occlusion status post self-expanding stent placement (7 X unkonw length). Both done by Dr. Lucky Cowboy.  . Coronary artery disease     a. Status post LAD PCI in 2013 [facility];  b. October 2014 - Nuclear stress test showed inferior wall defect->Cath: patent LAD stent with 90% stenosis in the proximal RCA ( 2.5 x 23 mm DES), EF 50%;  c. 02/2014 MV w/ large inferior scar->Cath: LAD 70m, LCX 97m, 20d, RCA 20p/m, patent stent->Med Rx.  [name] Hyperlipidemia   . Hypertension   . Tobacco use     a. 75+ yr hx - still smoking 1ppd, down from 2 ppd.  [name] COPD (chronic obstructive pulmonary disease) ([place])   . Neuropathy ([place])   . Varicose vein   . Atherosclerosis   . Sleep apnea   . BPH (benign prostatic hyperplasia)   . Arthritis     PAST SURGICAL HISTORY:   Past Surgical History  Procedure Laterality Date  . Appendectomy    . Peripheral arterial stent graft      x2 left/right  . Clavicle surgery    . Hernia repair    . Cardiac catheterization  10/14    [facility]; X1 STENT PROXIMAL RCA  . Cardiac catheter[facility]  02/2010    [facility]  . Cardiac catheterization  02/21/2014    [facility]  .  Abdominal aortagram N/A 06/23/2013    Procedure: ABDOMINAL Maxcine Ham;  Surgeon: Wellington Hampshire, MD;  Location: [facility] CATH LAB;  Service: Cardiovascular;  Laterality: N/A;  . Peripheral vascular catheterization N/A 07/06/2015    Procedure: Abdominal Aortogram w/Lower Extremity;  Surgeon: Algernon Huxley, MD;  Location: [place] CV LAB;  Service: Cardiovascular;  Laterality: N/A;  . Peripheral vascular catheterization  07/06/2015    Procedure: Lower Extremity Intervention;  Surgeon: Algernon Huxley, MD;  Location: [place] CV LAB;  Service: Cardiovascular;;    SOCIAL HISTORY:   Social History  Substance Use Topics  . Smoking status: Current Every Day Smoker -- 1.00 packs/day for 41 years    Types: Cigarettes  . Smokeless tobacco: Not  on file  . Alcohol Use: No    FAMILY HISTORY:   Family History  Problem Relation Age of Onset  . Heart attack Father   . Heart disease Father   . Hypertension Father   . Hyperlipidemia Father   . Hypertension Mother   . Hyperlipidemia Mother     DRUG ALLERGIES:  No Known Allergies  REVIEW OF SYSTEMS:   Review of Systems  Constitutional: Positive for chills. Negative for fever, weight loss and malaise/fatigue.  HENT: Negative for ear discharge, ear pain, hearing loss and nosebleeds.   Eyes: Negative for blurred vision, double vision and photophobia.  Respiratory: Negative for cough, hemoptysis, shortness of breath and wheezing.   Cardiovascular: Negative for chest pain, palpitations, orthopnea and leg swelling.  Gastrointestinal: Positive for nausea. Negative for heartburn, vomiting, abdominal pain, diarrhea, constipation and melena.  Genitourinary: Negative for dysuria, urgency, frequency and hematuria.  Musculoskeletal: Positive for myalgias and joint pain. Negative for back pain and neck pain.  Skin: Negative for rash.  Neurological: Negative for dizziness, tingling, tremors, sensory change, speech change, focal weakness and headaches.  Endo/Heme/Allergies: Does not bruise/bleed easily.  Psychiatric/Behavioral: Negative for depression.    MEDICATIONS AT HOME:   Prior to Admission medications   Medication Sig Start Date End Date Taking? Authorizing Provider  albuterol (PROVENTIL HFA;VENTOLIN HFA) 108 (90 BASE) MCG/ACT inhaler Inhale 2 puffs into the lungs every 6 (six) hours as needed for wheezing or shortness of breath. 04/15/13   Wellington Hampshire, MD  aspirin EC 81 MG tablet Take 1 tablet (81 mg total) by mouth daily. 05/11/15   Wellington Hampshire, MD  chlorpheniramine-HYDROcodone (TUSSIONEX PENNKINETIC ER) 10-8 MG/5ML SUER Take 5 mLs by mouth 2 (two) times daily. 11/12/15   Earleen Newport, MD  cilostazol (PLETAL) 100 MG tablet Take 1 tablet (100 mg total) by mouth 2  (two) times daily. 05/11/15   Wellington Hampshire, MD  clopidogrel (PLAVIX) 75 MG tablet Take 1 tablet (75 mg total) by mouth daily. 07/06/15   Algernon Huxley, MD  doxycycline (VIBRAMYCIN) 100 MG capsule Take 1 capsule (100 mg total) by mouth 2 (two) times daily. 11/12/15   Earleen Newport, MD  gabapentin (NEURONTIN) 800 MG tablet Take 800 mg by mouth 3 (three) times daily.    Historical Provider, MD  HYDROcodone-acetaminophen (NORCO/VICODIN) 5-325 MG tablet Take 1 tablet by mouth every 6 (six) hours as needed.  03/11/15   Historical Provider, MD  Insulin Glargine (LANTUS SOLOSTAR) 100 UNIT/ML Solostar Pen Inject 45 Units into the skin 2 (two) times daily. Per sliding scale 08/12/14 07/06/15  Historical Provider, MD  meclizine (ANTIVERT) 25 MG tablet Take 1 tablet (25 mg total) by mouth 3 (three) times daily as needed for dizziness. 04/09/15  Orbie Pyo, MD  nitroGLYCERIN (NITRODUR - DOSED IN MG/24 HR) 0.4 mg/hr patch Place 0.4 mg onto the skin daily.  05/08/15   Historical Provider, MD  orphenadrine (NORFLEX) 100 MG tablet Take 1 tablet by mouth 2 (two) times daily as needed. 03/27/15   Historical Provider, MD  oxyCODONE-acetaminophen (ROXICET) 5-325 MG tablet Take 1 tablet by mouth every 4 (four) hours as needed for severe pain. 07/10/15   Paulette Blanch, MD      VITAL SIGNS:  Blood pressure 115/65, pulse 109, temperature 98.5 F (36.9 C), temperature source Oral, resp. rate 18, height 5\' 7"  (1.702 m), weight 107.049 kg (236 lb), SpO2 96 %.  PHYSICAL EXAMINATION:   Physical Exam  GENERAL:  55 y.o.-year-old patient lying in the bed with no acute distress.  EYES: Pupils equal, round, reactive to light and accommodation. No scleral icterus. Extraocular muscles intact.  HEENT: Head atraumatic, normocephalic. Oropharynx and nasopharynx clear.  NECK:  Supple, no jugular venous distention. No thyroid enlargement, no tenderness.  LUNGS: Normal breath sounds bilaterally, no wheezing, rales,rhonchi  or crepitation. No use of accessory muscles of respiration.  CARDIOVASCULAR: S1, S2 normal. No murmurs, rubs, or gallops.  ABDOMEN: Soft, nontender, nondistended. Bowel sounds present. No organomegaly or mass.  EXTREMITIES: No pedal edema, cyanosis, or clubbing. Right leg 2+ edema, erythema, all the way to the groin, and fluctuant purulent looking area on the [lantar surface of right middle foot NEUROLOGIC: Cranial nerves II through XII are intact. Muscle strength 5/5 in all extremities. Sensation intact. Gait not checked.  PSYCHIATRIC: The patient is alert and oriented x 3.  SKIN: No obvious rash, lesion, or ulcer.   LABORATORY PANEL:   CBC  Recent Labs Lab 11/21/15 1356  WBC 11.2*  HGB 14.0  HCT 41.0  PLT 221   ------------------------------------------------------------------------------------------------------------------  Chemistries   Recent Labs Lab 11/21/15 1356  NA 130*  K 4.1  CL 93*  CO2 28  GLUCOSE 355*  BUN 10  CREATININE 0.86  CALCIUM 9.0  AST 19  ALT 20  ALKPHOS 79  BILITOT 1.1   ------------------------------------------------------------------------------------------------------------------  Cardiac Enzymes No results for input(s): TROPONINI in the last 168 hours. ------------------------------------------------------------------------------------------------------------------  RADIOLOGY:  US Venous Img Lower Unilateral Right  11/20/2015  CLINICAL DATA:  Pain and redness x1 day. Stent placement 3 years ago. EXAM: RIGHT LOWER EXTREMITY VENOUS DOPPLER ULTRASOUND TECHNIQUE: Gray-scale sonography with compression, as well as color and duplex ultrasound, were performed to evaluate the deep venous system from the level of the common femoral vein through the popliteal and proximal calf veins. COMPARISON:  None FINDINGS: Normal compressibility of the common femoral, superficial femoral, and popliteal veins, as well as the proximal calf veins. No filling  defects to suggest DVT on grayscale or color Doppler imaging. Doppler waveforms show normal direction of venous flow, normal respiratory phasicity and response to augmentation. Survey views of the contralateral common femoral vein are unremarkable. IMPRESSION: No evidence of right lower extremity deep vein thrombosis. Electronically Signed   By: Lucrezia Europe M.D.   On: 11/20/2015 15:09   Dg Foot Complete Right  11/21/2015  CLINICAL DATA:  Pain and redness volar aspect of foot after stepping on rock 2 days prior. History of diabetes mellitus EXAM: RIGHT FOOT COMPLETE - 3+ VIEW COMPARISON:  None. FINDINGS: Frontal, oblique, and lateral views were obtained. There is soft tissue swelling along the volar aspect of the foot. There is no soft tissue air or radiopaque foreign body. No acute fracture or  dislocation is evident. No demonstrable erosive change or bony destruction. The joint spaces appear unremarkable. There is mild spurring along the dorsal midfoot. There are small posterior and inferior calcaneal spurs. IMPRESSION: Soft tissue swelling along the volar aspect of the midfoot and forefoot. No radiopaque foreign body or soft tissue air. No fracture or dislocation. No erosive change or bony destruction. Calcaneal spurs present. Spurring dorsal midfoot. Electronically Signed   By: Lowella Grip III M.D.   On: 11/21/2015 14:39    EKG:   Orders placed or performed during the hospital encounter of 11/12/15  . ED EKG  . ED EKG  . EKG    IMPRESSION AND PLAN:   Shaleek Decola  is a 55 y.o. male with a known history of CAD status post stent, peripheral arterial disease status post stents, diastolic CHF, diabetes and hypertension, asthma comes to the hospital secondary to 3 day history of right foot erythema and swelling.  #1 Right foot and leg cellulitis- fluctuant area on the plantar surface of the foot - IV vanc and zosyn - Podiatry consult - pain meds and IV fluids -X-ray shows no evidence of  osteomyelitis.  #2 hyponatremia-Secondary to hypovolemia and also elevated blood sugars. Gentle hydration.  #3 uncontrolled diabetes mellitus-check A1c, ordered on sliding scale insulin. -Once home medications are verified, will start on home medications.  #4 CAD status post stents-stable. Continue aspirin, Plavix, statin.  #5 peripheral vascular disease status post stents-again stable. Able to palpate pulses. On aspirin and Plavix.  #6 DVT prophylaxis-on Lovenox    All the records are reviewed and case discussed with ED provider. Management plans discussed with the patient, family and they are in agreement.  CODE STATUS:  Full Code  TOTAL TIME TAKING CARE OF THIS PATIENT: 50 minutes.    Gladstone Lighter M.D on 11/21/2015 at 3:48 PM  Between 7am to 6pm - Pager - [phone]  After 6pm go to [email or web address] - password [email or web address] [email or web address]  Office  [phone]  CC: Primary care physician; Chrisandra Carota, MD

## 2015-11-21 NOTE — ED Notes (Signed)
  Attempt to call report X 1, accepting RN in another pt room giving meds; unable to give report.

## 2015-11-21 NOTE — ED Notes (Signed)
Patient transported to X-ray 

## 2015-11-21 NOTE — ED Notes (Addendum)
Pt stepped on a rock X "a few days ago" on bottom of right foot. Pain since, seen by PCP and dx with cellulitis. Mild redness on bottom of foot. No drainage from wound. Pt diabetic. Pt seen in ER yesterday and blood clot ruled out. States redness worsening overnight.

## 2015-11-21 NOTE — ED Notes (Signed)
MD at bedside. 

## 2015-11-21 NOTE — ED Notes (Signed)
Admitting MD at bedside.

## 2015-11-21 NOTE — Progress Notes (Signed)
Pharmacy Antibiotic Note  [name] [name] is a 55 y.o. male admitted on 11/21/2015 with cellulitis .  Pharmacy has been consulted for Vancomycin and Zosyn  dosing.  Plan: Will give Vancomycin 1250 mg IV x 1 and will then start Vancomycin 750 mg IV q8 hours. Will order Trough level prior to the 00:00 dose on 6/15.   Zosyn: Will give Zosyn 3.375 g IV q8 hours.   Height: 5\' 7"  (170.2 cm) Weight: 236 lb (107.049 kg) IBW/kg (Calculated) : 66.1  Temp (24hrs), Avg:98.5 F (36.9 C), Min:98.5 F (36.9 C), Max:98.5 F (36.9 C)   Recent Labs Lab 11/21/15 1356  WBC 11.2*  CREATININE 0.86    Estimated Creatinine Clearance: 115.9 mL/min (by C-G formula based on Cr of 0.86).    No Known Allergies  Antimicrobials this admission:   >>    >>   Dose adjustments this admission:   Microbiology results:  BCx: pending   UCx:  Pending   Sputum:   MRSA PCR:   Thank you for allowing pharmacy to be a part of this patient's care.  Shateka Petrea D 11/21/2015 4:03 PM
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
## 2015-11-22 LAB — CBC
HCT: 38.4 % — ABNORMAL LOW (ref 40.0–52.0)
Hemoglobin: 13.4 g/dL (ref 13.0–18.0)
MCH: 30.1 pg (ref 26.0–34.0)
MCHC: 34.8 g/dL (ref 32.0–36.0)
MCV: 86.4 fL (ref 80.0–100.0)
Platelets: 213 10*3/uL (ref 150–440)
RBC: 4.44 MIL/uL (ref 4.40–5.90)
RDW: 14.1 % (ref 11.5–14.5)
WBC: 8.4 10*3/uL (ref 3.8–10.6)

## 2015-11-22 LAB — GLUCOSE, CAPILLARY
Glucose-Capillary: 204 mg/dL — ABNORMAL HIGH (ref 65–99)
Glucose-Capillary: 202 mg/dL — ABNORMAL HIGH (ref 65–99)

## 2015-11-22 LAB — BASIC METABOLIC PANEL
Anion gap: 7 (ref 5–15)
BUN: 10 mg/dL (ref 6–20)
CO2: 29 mmol/L (ref 22–32)
Calcium: 8.8 mg/dL — ABNORMAL LOW (ref 8.9–10.3)
Chloride: 99 mmol/L — ABNORMAL LOW (ref 101–111)
Creatinine, Ser: 0.71 mg/dL (ref 0.61–1.24)
GFR calc Af Amer: >60 mL/min (ref >60)
GFR calc non Af Amer: >60 mL/min (ref >60)
Glucose, Bld: 270 mg/dL — ABNORMAL HIGH (ref 65–99)
Potassium: 4.6 mmol/L (ref 3.5–5.1)
Sodium: 135 mmol/L (ref 135–145)

## 2015-11-22 LAB — HEMOGLOBIN A1C: Hgb A1c MFr Bld: 10.3 % — ABNORMAL HIGH (ref 4.0–6.0)

## 2015-11-22 MED ORDER — INSULIN ASPART 100 UNIT/ML ~~LOC~~ SOLN
0.0000 [IU] | Freq: Three times a day (TID) | SUBCUTANEOUS | Status: DC
  Administered 2015-11-22: 5 [IU] via SUBCUTANEOUS
  Filled 2015-11-22: qty 5

## 2015-11-22 MED ORDER — CIPROFLOXACIN HCL 500 MG PO TABS: 500.0000 mg | ORAL_TABLET | Freq: Two times a day (BID) | ORAL | Status: DC

## 2015-11-22 MED ORDER — AMOXICILLIN-POT CLAVULANATE 875-125 MG PO TABS: 1.0000 | ORAL_TABLET | Freq: Two times a day (BID) | ORAL | Status: DC

## 2015-11-22 MED ORDER — INSULIN GLARGINE 100 UNITS/ML SOLOSTAR PEN: 45.0000 [IU] | PEN_INJECTOR | Freq: Two times a day (BID) | SUBCUTANEOUS | Status: DC

## 2015-11-22 MED ORDER — INSULIN GLARGINE 100 UNIT/ML ~~LOC~~ SOLN
45.0000 [IU] | Freq: Two times a day (BID) | SUBCUTANEOUS | Status: DC
  Administered 2015-11-22: 45 [IU] via SUBCUTANEOUS
  Filled 2015-11-22 (×3): qty 0.45

## 2015-11-22 MED ORDER — INSULIN ASPART 100 UNIT/ML ~~LOC~~ SOLN
0.0000 [IU] | Freq: Every day | SUBCUTANEOUS | Status: DC
Start: 2015-11-22 — End: 2015-11-22

## 2015-11-22 NOTE — Progress Notes (Signed)
Telephone order received from Dr. Cleda Mccreedy to make pt NPO. Order placed.

## 2015-11-22 NOTE — Progress Notes (Signed)
DISCHARGE NOTE:  Pt given discharge intructions and prescriptions. Pt verbalized understanding. Pt wheeled to car.

## 2015-11-22 NOTE — Care Management Note (Addendum)
Case Management Note  Patient Details  Name: ERBY SANDERSON MRN: [id number] Date of Birth: March 12, 1961  Subjective/Objective:                  Met with patient to discuss discharge planning. He states he lives with a girlfriend and drives. His PCP is with [facility] in [place]. He has a wheelchair and cane available at home however he states he is usually independent with mobility. He uses [facility] for meds and mail orders. He denies problem obtaining his meds. He states he has a working glucometer available for use at home.   Action/Plan: List of home health agencies left with patient in the event he need home IV antibiotics. RNCM will continue to follow.  Expected Discharge Date:  11/23/15               Expected Discharge Plan:     In-House Referral:     Discharge planning Services  CM Consult  Post Acute Care Choice:    Choice offered to:  Patient  DME Arranged:    DME Agency:     HH Arranged:    [place] Agency:     Status of Service:  In process, will continue to follow  Medicare Important Message Given:    Date Medicare IM Given:    Medicare IM give by:    Date Additional Medicare IM Given:    Additional Medicare Important Message give by:     If discussed at [place] of Stay Meetings, dates discussed:    Additional Comments:  Marshell Garfinkel, RN 11/22/2015, 9:57 AM

## 2015-11-22 NOTE — Progress Notes (Signed)
Spoke with patient and his significant other about diabetes and home regimen for diabetes control. Patient reports that he is followed by PCP for diabetes management and currently he takes Lantus 40 units QAM , Lantus 20 units QHS, Metformin 1000 mg QAM, and Humalog PRN if glucose is over 300 mg/dl as an outpatient for diabetes control. Patient states that he does not check his glucose routinely as he only checks it when he has symptoms of hyperglycemia (dry mouth, frequent urination, and blurry vision) and his glucose is usually over 300 mg/dl when he does check it.  Inquired about prior A1C and patient reports that he does not recall his last A1C value. Discussed A1C results (10.3% on 11/21/2015) and explained that his current A1C indicates an average glucose of 249 mg/dl over the past 2-3 months. Discussed glucose and A1C goals. Discussed importance of checking CBGs and maintaining good CBG control to prevent long-term and short-term complications. Explained how hyperglycemia leads to damage within blood vessels which lead to the common complications seen with uncontrolled diabetes. Stressed to the patient the importance of improving glycemic control to prevent further complications from uncontrolled diabetes especially for wound healing. Discussed impact of nutrition, exercise, stress, sickness, and medications on diabetes control.  Encouraged patient to take DM medications as prescribed and to check his glucose at least 2 times per day and suggested he check it 3-4 times per day (before meals and at bedtime) to help his doctor identify glucose trends so adjustments can be made to his DM medications. Patient verbalized understanding of information discussed and he states that he has no further questions at this time related to diabetes.  As an inpatient, patient is ordered: Lantus 45 units BID, Novolog 0-15 units TID with meals, Novolog 0-5 units QHS, and Metformin 1000 mg BID for glycemic control. Would  recommend discharging patient on increased dose of Lantus and provide correction scale (as used as an inpatient) TID with meals for his Humalog insulin he already has at home.  Thanks, Barnie Alderman, RN, MSN, CDE Diabetes Coordinator Inpatient Diabetes Program [phone] (Team Pager) [phone] ([facility] office) [phone] [facility] office) [phone] [facility] office)

## 2015-11-22 NOTE — Discharge Summary (Signed)
[facility] NAME: [name] [name]    MR#:  [id number]  DATE OF BIRTH:  1961-02-15  DATE OF ADMISSION:  11/21/2015 ADMITTING PHYSICIAN: Gladstone Lighter, MD  DATE OF DISCHARGE: 11/22/2015 PRIMARY CARE PHYSICIAN: Chrisandra Carota, MD    ADMISSION DIAGNOSIS:  Hyponatremia [E87.1] Hyperglycemia [R73.9] Chills (without fever) [R68.83] Cellulitis of right lower extremity [L03.115] Foot abrasion, right, initial encounter [S90.811A] Burn of right hand, second degree, initial encounter [id number]   DISCHARGE DIAGNOSIS:   Cellulitis with ulceration right midfoot. SECONDARY DIAGNOSIS:   Past Medical History  Diagnosis Date  . Peripheral vascular disease ([place])   . Diabetes mellitus without complication ([place])   . Morbid obesity ([place])   . Syncope and collapse   . Chronic diastolic CHF (congestive heart failure) ([place])     a. 02/2013 EF 50% by LV gram.  . Asthma   . Hernia   . PAD (peripheral artery disease) ([place])     a. Angiography in March of 2012 showed 70-80% proximal right SFA stenosis status post self-expanding stent placement (6X60 mm). Angiography in April 2012 showed proximal left SFA occlusion status post self-expanding stent placement (7 X unkonw length). Both done by Dr. Lucky Cowboy.  . Coronary artery disease     a. Status post LAD PCI in 2013 at [facility];  b. October 2014 - Nuclear stress test showed inferior wall defect->Cath: patent LAD stent with 90% stenosis in the proximal RCA ( 2.5 x 23 mm DES), EF 50%;  c. 02/2014 MV w/ large inferior scar->Cath: LAD 77m, LCX 2m, 20d, RCA 20p/m, patent stent->Med Rx.  [name] Hyperlipidemia   . Hypertension   . Tobacco use     a. 75+ yr hx - still smoking 1ppd, down from 2 ppd.  [name] COPD (chronic obstructive pulmonary disease) ([place])   . Neuropathy ([place])   . Varicose vein   . Atherosclerosis   . Sleep apnea   . BPH (benign prostatic hyperplasia)   . Arthritis     HOSPITAL COURSE:   [name] [name]  is a 55 y.o. male with a known history of CAD status post stent, peripheral arterial disease status post stents, diastolic CHF, diabetes and hypertension, asthma comes to the hospital secondary to 3 day history of right foot erythema and swelling.  #1 Cellulitis with ulceration right midfoot. He is treated with IV vanc and zosyn. He is on pain meds and IV fluids -X-ray shows no evidence of osteomyelitis. - Podiatry consult, Dr. Cleda Mccreedy did I&D today, he suggested the pt can be discharged home today with cipro and augmentin for 10 days.  #2 hyponatremia-Secondary to hypovolemia and also elevated blood sugars. improved.  #3 uncontrolled diabetes mellitus-check A1c 10.3.   Increased lantus to 45 units bid and on sliding scale insulin and metformin.  #4 CAD status post stents-stable. Continue aspirin, Plavix, statin.  #5 peripheral vascular disease status post stents-again stable.  On aspirin and Plavix.  Tobacco abuse. Smoking cessation was counseled for 3 minutes.  I discussed with Dr. Cleda Mccreedy.  DISCHARGE CONDITIONS:   Stable, discharge to home today.  CONSULTS OBTAINED:  Treatment Team:  Sharlotte Alamo, DPM  DRUG ALLERGIES:  No Known Allergies  DISCHARGE MEDICATIONS:   Current Discharge Medication List    START taking these medications   Details  amoxicillin-clavulanate (AUGMENTIN) 875-125 MG tablet Take 1 tablet by mouth 2 (two) times daily. Qty: 20 tablet, Refills: 0    ciprofloxacin (CIPRO) 500 MG tablet Take 1 tablet (  500 mg total) by mouth 2 (two) times daily. Qty: 20 tablet, Refills: 0      CONTINUE these medications which have CHANGED   Details  insulin glargine (LANTUS) 100 unit/mL SOPN Inject 0.45 mLs (45 Units total) into the skin 2 (two) times daily. Qty: 15 mL, Refills: 2      CONTINUE these medications which have NOT CHANGED   Details  albuterol (PROVENTIL HFA;VENTOLIN HFA) 108 (90 BASE) MCG/ACT inhaler Inhale 2 puffs into the lungs every 6 (six) hours as  needed for wheezing or shortness of breath. Qty: 1 Inhaler, Refills: 2    aspirin EC 81 MG tablet Take 1 tablet (81 mg total) by mouth daily. Qty: 90 tablet, Refills: 3    atorvastatin (LIPITOR) 40 MG tablet Take 40 mg by mouth at bedtime.    chlorpheniramine-HYDROcodone (TUSSIONEX PENNKINETIC ER) 10-8 MG/5ML SUER Take 5 mLs by mouth every 12 (twelve) hours as needed for cough.    clopidogrel (PLAVIX) 75 MG tablet Take 1 tablet (75 mg total) by mouth daily. Qty: 30 tablet, Refills: 11    fluticasone (FLONASE) 50 MCG/ACT nasal spray Place 2 sprays into both nostrils daily.    fluticasone furoate-vilanterol (BREO ELLIPTA) 100-25 MCG/INH AEPB Inhale 1 puff into the lungs daily.    gabapentin (NEURONTIN) 800 MG tablet Take 800 mg by mouth 3 (three) times daily.    insulin lispro (HUMALOG) 100 UNIT/ML injection Inject 0-10 Units into the skin 3 (three) times daily with meals as needed for high blood sugar. Pt uses as needed per sliding scale:    0-150:  0 units  151-200:  2 units  201-250:  4 units  251-300:  6 units  301-350:  8 units  351-400:  10 units  Greater than 400:  10 units and call MD    meclizine (ANTIVERT) 25 MG tablet Take 1 tablet (25 mg total) by mouth 3 (three) times daily as needed for dizziness. Qty: 30 tablet, Refills: 0    metFORMIN (GLUCOPHAGE) 1000 MG tablet Take 1,000 mg by mouth 2 (two) times daily with a meal.    nitroGLYCERIN (NITRODUR - DOSED IN MG/24 HR) 0.4 mg/hr patch Place 0.4 mg onto the skin daily as needed (for chest pain).     nitroGLYCERIN (NITROSTAT) 0.4 MG SL tablet Place 0.4 mg under the tongue every 5 (five) minutes as needed for chest pain.    oxyCODONE-acetaminophen (ROXICET) 5-325 MG tablet Take 1 tablet by mouth every 4 (four) hours as needed for severe pain. Qty: 15 tablet, Refills: 0      STOP taking these medications     doxycycline (VIBRAMYCIN) 100 MG capsule          DISCHARGE INSTRUCTIONS:    If you experience  worsening of your admission symptoms, develop shortness of breath, life threatening emergency, suicidal or homicidal thoughts you must seek medical attention immediately by calling 911 or calling your MD immediately  if symptoms less severe.  You Must read complete instructions/literature along with all the possible adverse reactions/side effects for all the Medicines you take and that have been prescribed to you. Take any new Medicines after you have completely understood and accept all the possible adverse reactions/side effects.   Please note  You were cared for by a hospitalist during your hospital stay. If you have any questions about your discharge medications or the care you received while you were in the hospital after you are discharged, you can call the unit and asked to speak  with the hospitalist on call if the hospitalist that took care of you is not available. Once you are discharged, your primary care physician will handle any further medical issues. Please note that NO REFILLS for any discharge medications will be authorized once you are discharged, as it is imperative that you return to your primary care physician (or establish a relationship with a primary care physician if you do not have one) for your aftercare needs so that they can reassess your need for medications and monitor your lab values.    Today   SUBJECTIVE   No complaint.   VITAL SIGNS:  Blood pressure 150/67, pulse 102, temperature 99.1 F (37.3 C), temperature source Oral, resp. rate 18, height 5\' 7"  (1.702 m), weight 236 lb (107.049 kg), SpO2 96 %.  I/O:   Intake/Output Summary (Last 24 hours) at 11/22/15 1515 Last data filed at 11/22/15 1139  Gross per 24 hour  Intake      0 ml  Output    200 ml  Net   -200 ml    PHYSICAL EXAMINATION:  GENERAL:  55 y.o.-year-old patient lying in the bed with no acute distress.  EYES: Pupils equal, round, reactive to light and accommodation. No scleral icterus.  Extraocular muscles intact.  HEENT: Head atraumatic, normocephalic. Oropharynx and nasopharynx clear.  NECK:  Supple, no jugular venous distention. No thyroid enlargement, no tenderness.  LUNGS: Normal breath sounds bilaterally, no wheezing, rales,rhonchi or crepitation. No use of accessory muscles of respiration.  CARDIOVASCULAR: S1, S2 normal. No murmurs, rubs, or gallops.  ABDOMEN: Soft, non-tender, non-distended. Bowel sounds present. No organomegaly or mass.  EXTREMITIES: No pedal edema, cyanosis, or clubbing. Erythema on right foot in the arch and medial foot up to the medial ankle. A large blister formation on the plantar aspect of the right arch. NEUROLOGIC: Cranial nerves II through XII are intact. Muscle strength 5/5 in all extremities. Sensation intact. Gait not checked.  PSYCHIATRIC: The patient is alert and oriented x 3.  SKIN: No obvious rash, lesion, or ulcer.   DATA REVIEW:   CBC  Recent Labs Lab 11/22/15 0416  WBC 8.4  HGB 13.4  HCT 38.4*  PLT 213    Chemistries   Recent Labs Lab 11/21/15 1356 11/22/15 0416  NA 130* 135  K 4.1 4.6  CL 93* 99*  CO2 28 29  GLUCOSE 355* 270*  BUN 10 10  CREATININE 0.86 0.71  CALCIUM 9.0 8.8*  AST 19  --   ALT 20  --   ALKPHOS 79  --   BILITOT 1.1  --     Cardiac Enzymes No results for input(s): TROPONINI in the last 168 hours.  Microbiology Results  Results for orders placed or performed during the hospital encounter of 11/21/15  Blood culture (routine x 2)     Status: None (Preliminary result)   Collection Time: 11/21/15  2:30 PM  Result Value Ref Range Status   Specimen Description BLOOD LEFT ARM  Final   Special Requests AER 10CC ANA 10CC  Final   Culture NO GROWTH < 24 HOURS  Final   Report Status PENDING  Incomplete  Blood culture (routine x 2)     Status: None (Preliminary result)   Collection Time: 11/21/15  2:35 PM  Result Value Ref Range Status   Specimen Description BLOOD LEFT WRIST  Final   Special  Requests AER 10CC ANA 10CC  Final   Culture NO GROWTH < 24 HOURS  Final  Report Status PENDING  Incomplete    RADIOLOGY:  Dg Foot Complete Right  11/21/2015  CLINICAL DATA:  Pain and redness volar aspect of foot after stepping on rock 2 days prior. History of diabetes mellitus EXAM: RIGHT FOOT COMPLETE - 3+ VIEW COMPARISON:  None. FINDINGS: Frontal, oblique, and lateral views were obtained. There is soft tissue swelling along the volar aspect of the foot. There is no soft tissue air or radiopaque foreign body. No acute fracture or dislocation is evident. No demonstrable erosive change or bony destruction. The joint spaces appear unremarkable. There is mild spurring along the dorsal midfoot. There are small posterior and inferior calcaneal spurs. IMPRESSION: Soft tissue swelling along the volar aspect of the midfoot and forefoot. No radiopaque foreign body or soft tissue air. No fracture or dislocation. No erosive change or bony destruction. Calcaneal spurs present. Spurring dorsal midfoot. Electronically Signed   By: Lowella Grip III M.D.   On: 11/21/2015 14:39        Management plans discussed with the patient, family and they are in agreement.  CODE STATUS:     Code Status Orders        Start     Ordered   11/21/15 1717  Full code   Continuous     11/21/15 1716    Code Status History    Date Active Date Inactive Code Status Order ID Comments User Context   07/06/2015  9:48 AM 07/06/2015  2:37 PM Full Code [id number]  Algernon Huxley, MD Inpatient      TOTAL TIME TAKING CARE OF THIS PATIENT: 36 minutes.    Demetrios Loll M.D on 11/22/2015 at 3:15 PM  Between 7am to 6pm - Pager - [phone]  After 6pm go to [email or web address] - password [email or web address] [facility]  Office  [phone]  CC: Primary care physician; Chrisandra Carota, MD

## 2015-11-22 NOTE — Consult Note (Signed)
Reason for Consult: Cellulitis with abscess and ulcerative area on the right foot Referring Physician: [facility]  [name] [name] is an 55 y.o. male.  HPI: [name] [name] relates a history of being out working in his yard about 4 days ago and thinks he may have stepped on something with his right foot. Over the next few days he developed significant redness extending all the way up his leg and became concerned. Presented to the emergency department for evaluation. Was admitted with cellulitis and placed on IV antibiotics. Does not specifically remember an injury but he states he is diabetic with no significant feeling in the bottoms of his feet  Past Medical History  Diagnosis Date  . Peripheral vascular disease ([place])   . Diabetes mellitus without complication ([place])   . Morbid obesity ([place])   . Syncope and collapse   . Chronic diastolic CHF (congestive heart failure) ([place])     a. 02/2013 EF 50% by LV gram.  . Asthma   . Hernia   . PAD (peripheral artery disease) ([place])     a. Angiography in March of 2012 showed 70-80% proximal right SFA stenosis status post self-expanding stent placement (6X60 mm). Angiography in April 2012 showed proximal left SFA occlusion status post self-expanding stent placement (7 X unkonw length). Both done by Dr. Lucky Cowboy.  . Coronary artery disease     a. Status post LAD PCI in 2013 at [facility];  b. October 2014 - Nuclear stress test showed inferior wall defect->Cath: patent LAD stent with 90% stenosis in the proximal RCA ( 2.5 x 23 mm DES), EF 50%;  c. 02/2014 MV w/ large inferior scar->Cath: LAD 21m LCX 332m20d, RCA 20p/m, patent stent->Med Rx.  . Marland Kitchenyperlipidemia   . Hypertension   . Tobacco use     a. 75+ yr hx - still smoking 1ppd, down from 2 ppd.  . Marland KitchenOPD (chronic obstructive pulmonary disease) ([place]  . Neuropathy ([place]  . Varicose vein   . Atherosclerosis   . Sleep apnea   . BPH (benign prostatic hyperplasia)   . Arthritis     Past Surgical History   Procedure Laterality Date  . Appendectomy    . Peripheral arterial stent graft      x2 left/right  . Clavicle surgery    . Hernia repair    . Cardiac catheterization  10/14    [facility]; X1 STENT PROXIMAL RCA  . [facility]  . Abdominal aortagram N/A 06/23/2013    Procedure: ABDOMINAL AOMaxcine Ham Surgeon: MuWellington HampshireMD;  [facility];  Service: Cardiovascular;  Laterality: N/A;  . Peripheral vascular catheterization N/A 07/06/2015    Procedure: Abdominal Aortogram w/Lower Extremity;  Surgeon: JaAlgernon HuxleyMD;  [facility];  Service: Cardiovascular;  Laterality: N/A;  . Peripheral vascular catheterization  07/06/2015    Procedure: Lower Extremity Intervention;  Surgeon: JaAlgernon HuxleyMD;  [facility];  Service: Cardiovascular;;    Family History  Problem Relation Age of Onset  . Heart attack Father   . Heart disease Father   . Hypertension Father   . Hyperlipidemia Father   . Hypertension Mother   . Hyperlipidemia Mother     Social History:  reports that he has been smoking Cigarettes.  He has a 41 pack-year smoking history. He does not have any smokeless tobacco  history on file. He reports that he does not drink alcohol or use illicit drugs.  Allergies: No Known Allergies  Medications:  Scheduled: . aspirin EC  81 mg Oral Daily  . atorvastatin  40 mg Oral QHS  . clopidogrel  75 mg Oral Daily  . enoxaparin (LOVENOX) injection  40 mg Subcutaneous Q24H  . fluticasone  2 spray Each Nare Daily  . fluticasone furoate-vilanterol  1 puff Inhalation Daily  . gabapentin  800 mg Oral TID  . insulin aspart  0-15 Units Subcutaneous TID WC  . insulin aspart  0-5 Units Subcutaneous QHS  . insulin glargine  45 Units Subcutaneous BID  . metFORMIN  1,000 mg Oral BID WC  . piperacillin-tazobactam (ZOSYN)  IV  3.375 g Intravenous Q8H  . vancomycin  750 mg Intravenous  Q8H    Results for orders placed or performed during the hospital encounter of 11/21/15 (from the past 48 hour(s))  CBC with Differential     Status: Abnormal   Collection Time: 11/21/15  1:56 PM  Result Value Ref Range   WBC 11.2 (H) 3.8 - 10.6 K/uL   RBC 4.75 4.40 - 5.90 MIL/uL   Hemoglobin 14.0 13.0 - 18.0 g/dL   HCT 41.0 40.0 - 52.0 %   MCV 86.2 80.0 - 100.0 fL   MCH 29.4 26.0 - 34.0 pg   MCHC 34.1 32.0 - 36.0 g/dL   RDW 14.0 11.5 - 14.5 %   Platelets 221 150 - 440 K/uL   Neutrophils Relative % 75% %   Neutro Abs 8.4 (H) 1.4 - 6.5 K/uL   Lymphocytes Relative 17% %   Lymphs Abs 1.9 1.0 - 3.6 K/uL   Monocytes Relative 6% %   Monocytes Absolute 0.7 0.2 - 1.0 K/uL   Eosinophils Relative 1% %   Eosinophils Absolute 0.1 0 - 0.7 K/uL   Basophils Relative 1% %   Basophils Absolute 0.1 0 - 0.1 K/uL  Comprehensive metabolic panel     Status: Abnormal   Collection Time: 11/21/15  1:56 PM  Result Value Ref Range   Sodium 130 (L) 135 - 145 mmol/L   Potassium 4.1 3.5 - 5.1 mmol/L   Chloride 93 (L) 101 - 111 mmol/L   CO2 28 22 - 32 mmol/L   Glucose, Bld 355 (H) 65 - 99 mg/dL   BUN 10 6 - 20 mg/dL   Creatinine, Ser 0.86 0.61 - 1.24 mg/dL   Calcium 9.0 8.9 - 10.3 mg/dL   Total Protein 7.4 6.5 - 8.1 g/dL   Albumin 3.5 3.5 - 5.0 g/dL   AST 19 15 - 41 U/L   ALT 20 17 - 63 U/L   Alkaline Phosphatase 79 38 - 126 U/L   Total Bilirubin 1.1 0.3 - 1.2 mg/dL   GFR calc non Af Amer >60 >60 mL/min   GFR calc Af Amer >60 >60 mL/min    Comment: (NOTE) The eGFR has been calculated using the CKD EPI equation. This calculation has not been validated in all clinical situations. eGFR's persistently <60 mL/min signify possible Chronic Kidney Disease.    Anion gap 9 5 - 15  Sedimentation rate     Status: Abnormal   Collection Time: 11/21/15  1:56 PM  Result Value Ref Range   Sed Rate 41 (H) 0 - 20 mm/hr  C-reactive protein     Status: Abnormal   Collection Time: 11/21/15  1:56 PM  Result  Value Ref Range   CRP 18.3 (H) <1.0  mg/dL    Comment: Performed at [facility]  Hemoglobin A1c     Status: Abnormal   Collection Time: 11/21/15  1:56 PM  Result Value Ref Range   Hgb A1c MFr Bld 10.3 (H) 4.0 - 6.0 %  Urinalysis complete, with microscopic ([facility] only)     Status: Abnormal   Collection Time: 11/21/15  2:24 PM  Result Value Ref Range   Color, Urine YELLOW (A) YELLOW   APPearance CLEAR (A) CLEAR   Glucose, UA >500 (A) NEGATIVE mg/dL   Bilirubin Urine NEGATIVE NEGATIVE   Ketones, ur NEGATIVE NEGATIVE mg/dL   Specific Gravity, Urine 1.036 (H) 1.005 - 1.030   Hgb urine dipstick 1+ (A) NEGATIVE   pH 6.0 5.0 - 8.0   Protein, ur 30 (A) NEGATIVE mg/dL   Nitrite NEGATIVE NEGATIVE   Leukocytes, UA NEGATIVE NEGATIVE   RBC / HPF 0-5 0 - 5 RBC/hpf   WBC, UA 0-5 0 - 5 WBC/hpf   Bacteria, UA NONE SEEN NONE SEEN   Squamous Epithelial / LPF 0-5 (A) NONE SEEN  Blood culture (routine x 2)     Status: None (Preliminary result)   Collection Time: 11/21/15  2:30 PM  Result Value Ref Range   Specimen Description BLOOD LEFT ARM    Special Requests AER 10CC ANA 10CC    Culture NO GROWTH < 24 HOURS    Report Status PENDING   Blood culture (routine x 2)     Status: None (Preliminary result)   Collection Time: 11/21/15  2:35 PM  Result Value Ref Range   Specimen Description BLOOD LEFT WRIST    Special Requests AER 10CC ANA 10CC    Culture NO GROWTH < 24 HOURS    Report Status PENDING   Glucose, capillary     Status: Abnormal   Collection Time: 11/21/15  5:07 PM  Result Value Ref Range   Glucose-Capillary 291 (H) 65 - 99 mg/dL  Glucose, capillary     Status: Abnormal   Collection Time: 11/21/15  9:09 PM  Result Value Ref Range   Glucose-Capillary 232 (H) 65 - 99 mg/dL  Basic metabolic panel     Status: Abnormal   Collection Time: 11/22/15  4:16 AM  Result Value Ref Range   Sodium 135 135 - 145 mmol/L   Potassium 4.6 3.5 - 5.1 mmol/L   Chloride 99 (L) 101 - 111 mmol/L    CO2 29 22 - 32 mmol/L   Glucose, Bld 270 (H) 65 - 99 mg/dL   BUN 10 6 - 20 mg/dL   Creatinine, Ser 0.71 0.61 - 1.24 mg/dL   Calcium 8.8 (L) 8.9 - 10.3 mg/dL   GFR calc non Af Amer >60 >60 mL/min   GFR calc Af Amer >60 >60 mL/min    Comment: (NOTE) The eGFR has been calculated using the CKD EPI equation. This calculation has not been validated in all clinical situations. eGFR's persistently <60 mL/min signify possible Chronic Kidney Disease.    Anion gap 7 5 - 15  CBC     Status: Abnormal   Collection Time: 11/22/15  4:16 AM  Result Value Ref Range   WBC 8.4 3.8 - 10.6 K/uL   RBC 4.44 4.40 - 5.90 MIL/uL   Hemoglobin 13.4 13.0 - 18.0 g/dL   HCT 38.4 (L) 40.0 - 52.0 %   MCV 86.4 80.0 - 100.0 fL   MCH 30.1 26.0 - 34.0 pg   MCHC 34.8 32.0 - 36.0 g/dL  RDW 14.1 11.5 - 14.5 %   Platelets 213 150 - 440 K/uL  Glucose, capillary     Status: Abnormal   Collection Time: 11/22/15  7:45 AM  Result Value Ref Range   Glucose-Capillary 204 (H) 65 - 99 mg/dL   Comment 1 Notify RN   Glucose, capillary     Status: Abnormal   Collection Time: 11/22/15 11:00 AM  Result Value Ref Range   Glucose-Capillary 202 (H) 65 - 99 mg/dL   Comment 1 Notify RN     US Venous Img Lower Unilateral Right  11/20/2015  CLINICAL DATA:  Pain and redness x1 day. Stent placement 3 years ago. EXAM: RIGHT LOWER EXTREMITY VENOUS DOPPLER ULTRASOUND TECHNIQUE: Gray-scale sonography with compression, as well as color and duplex ultrasound, were performed to evaluate the deep venous system from the level of the common femoral vein through the popliteal and proximal calf veins. COMPARISON:  None FINDINGS: Normal compressibility of the common femoral, superficial femoral, and popliteal veins, as well as the proximal calf veins. No filling defects to suggest DVT on grayscale or color Doppler imaging. Doppler waveforms show normal direction of venous flow, normal respiratory phasicity and response to augmentation. Survey views of  the contralateral common femoral vein are unremarkable. IMPRESSION: No evidence of right lower extremity deep vein thrombosis. Electronically Signed   By: Lucrezia Europe M.D.   On: 11/20/2015 15:09   Dg Foot Complete Right  11/21/2015  CLINICAL DATA:  Pain and redness volar aspect of foot after stepping on rock 2 days prior. History of diabetes mellitus EXAM: RIGHT FOOT COMPLETE - 3+ VIEW COMPARISON:  None. FINDINGS: Frontal, oblique, and lateral views were obtained. There is soft tissue swelling along the volar aspect of the foot. There is no soft tissue air or radiopaque foreign body. No acute fracture or dislocation is evident. No demonstrable erosive change or bony destruction. The joint spaces appear unremarkable. There is mild spurring along the dorsal midfoot. There are small posterior and inferior calcaneal spurs. IMPRESSION: Soft tissue swelling along the volar aspect of the midfoot and forefoot. No radiopaque foreign body or soft tissue air. No fracture or dislocation. No erosive change or bony destruction. Calcaneal spurs present. Spurring dorsal midfoot. Electronically Signed   By: Lowella Grip III M.D.   On: 11/21/2015 14:39    Review of Systems  Constitutional: Negative.   HENT: Negative.   Eyes: Negative.   Respiratory: Negative.   Cardiovascular: Negative.   Gastrointestinal: Negative.   Genitourinary: Negative.   Musculoskeletal: Negative.   Skin:       Some redness and swelling in the right leg for the past few days. Improved some since he came into the hospital  Neurological:       Relate significant neuropathy in his feet related to his diabetes.  Endo/Heme/Allergies: Negative.   Psychiatric/Behavioral: Negative.    Blood pressure 146/91, pulse 108, temperature 99.2 F (37.3 C), temperature source Oral, resp. rate 18, height 5' 7"  (1.702 m), weight 107.049 kg (236 lb), SpO2 93 %. Physical Exam  Cardiovascular:  DP and PT pulses are diminished but palpable, 1 over 4 and  weak bilateral. Capillary filling time intact  Musculoskeletal:  Adequate range of motion of the pedal joints. Muscle testing normal  Neurological:  Loss of protective threshold with monofilament wire distally in the forefoot and toes bilateral and up to the level of the ankle on the left. Proprioception is impaired.  Skin:  The skin is warm dry and mildly  atrophic. Diminished but present hair growth. Erythema is present in the arch and medial foot and extending up to the level of the medial ankle. A large blister formation on the plantar aspect of the right arch measuring approximately 4 cm diameter. The underlying ulceration is granular and superficial with the exception of a small full thickness ulcerative area with a whitish appearance measuring approximately 7 mm x 5 mm. No clear evidence of any deep extension to the deep subcutaneous tissues, sign of foreign body, or purulence noted beneath the ulcerative area.    Assessment/Plan: Assessment: 1. Cellulitis with ulceration right midfoot. 2. Diabetes with associated neuropathy. 3. Peripheral vascular disease.  Plan: Sharp excisional debridement of devitalized tissue from the ulceration was performed using a pair of tissue nippers for superficial debridement of the 4 cm ulcerative area with full thickness debridement of the central devitalized area approximately 7 mm x 5 mm. A sterile saline wet-to-dry dressing was applied to the right foot. Discussed with the patient that there does not clearly appear to be any deep underlying abscess. Discussed the possibility of an MRI but at this point would recommend holding off and seeing how he does on oral antibiotics at discharge as clinically there does not appear to be any deeper abscess.. Would recommend follow-up with me in my office in approximately one week. Instructed on daily wound care.  Durward Fortes 11/22/2015, 1:02 PM

## 2015-11-22 NOTE — Discharge Instructions (Signed)
Heart healthy and ADA diet. Activity as tolerated. Smoking cessation. Avoid foot injury.

## 2015-11-23 LAB — URINE CULTURE: Culture: <10000 — AB

## 2015-11-26 LAB — CULTURE, BLOOD (ROUTINE X 2)
Culture: NO GROWTH
Culture: NO GROWTH

## 2016-01-26 DIAGNOSIS — E1169 Type 2 diabetes mellitus with other specified complication: Secondary | ICD-10-CM | POA: Insufficient documentation

## 2016-01-26 DIAGNOSIS — E118 Type 2 diabetes mellitus with unspecified complications: Secondary | ICD-10-CM | POA: Insufficient documentation

## 2016-01-26 DIAGNOSIS — Z794 Long term (current) use of insulin: Secondary | ICD-10-CM

## 2016-05-05 ENCOUNTER — Emergency Department: Payer: Self-pay

## 2016-05-05 ENCOUNTER — Inpatient Hospital Stay
Admission: EM | Admit: 2016-05-05 | Discharge: 2016-05-07 | DRG: 623 | Disposition: A | Discharge status: Home / Self Care | Payer: Self-pay | Attending: Internal Medicine | Admitting: Internal Medicine

## 2016-05-05 DIAGNOSIS — I5032 Chronic diastolic (congestive) heart failure: Secondary | ICD-10-CM | POA: Diagnosis present

## 2016-05-05 DIAGNOSIS — Z8249 Family history of ischemic heart disease and other diseases of the circulatory system: Secondary | ICD-10-CM

## 2016-05-05 DIAGNOSIS — L97419 Non-pressure chronic ulcer of right heel and midfoot with unspecified severity: Secondary | ICD-10-CM | POA: Diagnosis present

## 2016-05-05 DIAGNOSIS — Z794 Long term (current) use of insulin: Secondary | ICD-10-CM

## 2016-05-05 DIAGNOSIS — L8990 Pressure ulcer of unspecified site, unspecified stage: Secondary | ICD-10-CM | POA: Insufficient documentation

## 2016-05-05 DIAGNOSIS — N400 Enlarged prostate without lower urinary tract symptoms: Secondary | ICD-10-CM | POA: Diagnosis present

## 2016-05-05 DIAGNOSIS — I110 Hypertensive heart disease with heart failure: Secondary | ICD-10-CM | POA: Diagnosis present

## 2016-05-05 DIAGNOSIS — L089 Local infection of the skin and subcutaneous tissue, unspecified: Secondary | ICD-10-CM | POA: Diagnosis present

## 2016-05-05 DIAGNOSIS — M869 Osteomyelitis, unspecified: Secondary | ICD-10-CM

## 2016-05-05 DIAGNOSIS — Z7902 Long term (current) use of antithrombotics/antiplatelets: Secondary | ICD-10-CM

## 2016-05-05 DIAGNOSIS — E1151 Type 2 diabetes mellitus with diabetic peripheral angiopathy without gangrene: Secondary | ICD-10-CM | POA: Diagnosis present

## 2016-05-05 DIAGNOSIS — E871 Hypo-osmolality and hyponatremia: Secondary | ICD-10-CM | POA: Diagnosis present

## 2016-05-05 DIAGNOSIS — T148XXA Other injury of unspecified body region, initial encounter: Secondary | ICD-10-CM

## 2016-05-05 DIAGNOSIS — E11628 Type 2 diabetes mellitus with other skin complications: Principal | ICD-10-CM | POA: Diagnosis present

## 2016-05-05 DIAGNOSIS — Z79899 Other long term (current) drug therapy: Secondary | ICD-10-CM

## 2016-05-05 DIAGNOSIS — J449 Chronic obstructive pulmonary disease, unspecified: Secondary | ICD-10-CM | POA: Diagnosis present

## 2016-05-05 DIAGNOSIS — E785 Hyperlipidemia, unspecified: Secondary | ICD-10-CM | POA: Diagnosis present

## 2016-05-05 DIAGNOSIS — I2510 Atherosclerotic heart disease of native coronary artery without angina pectoris: Secondary | ICD-10-CM | POA: Diagnosis present

## 2016-05-05 DIAGNOSIS — Z7982 Long term (current) use of aspirin: Secondary | ICD-10-CM

## 2016-05-05 DIAGNOSIS — R609 Edema, unspecified: Secondary | ICD-10-CM

## 2016-05-05 DIAGNOSIS — Z955 Presence of coronary angioplasty implant and graft: Secondary | ICD-10-CM

## 2016-05-05 DIAGNOSIS — F17210 Nicotine dependence, cigarettes, uncomplicated: Secondary | ICD-10-CM | POA: Diagnosis present

## 2016-05-05 DIAGNOSIS — E1140 Type 2 diabetes mellitus with diabetic neuropathy, unspecified: Secondary | ICD-10-CM | POA: Diagnosis present

## 2016-05-05 DIAGNOSIS — E11621 Type 2 diabetes mellitus with foot ulcer: Secondary | ICD-10-CM | POA: Diagnosis present

## 2016-05-05 DIAGNOSIS — E1165 Type 2 diabetes mellitus with hyperglycemia: Secondary | ICD-10-CM | POA: Diagnosis present

## 2016-05-05 DIAGNOSIS — L0390 Cellulitis, unspecified: Secondary | ICD-10-CM | POA: Diagnosis present

## 2016-05-05 LAB — COMPREHENSIVE METABOLIC PANEL
ALT: 13 U/L — ABNORMAL LOW (ref 17–63)
AST: 21 U/L (ref 15–41)
Albumin: 3.7 g/dL (ref 3.5–5.0)
Alkaline Phosphatase: 88 U/L (ref 38–126)
Anion gap: 8 (ref 5–15)
BUN: 10 mg/dL (ref 6–20)
CO2: 28 mmol/L (ref 22–32)
Calcium: 9.5 mg/dL (ref 8.9–10.3)
Chloride: 95 mmol/L — ABNORMAL LOW (ref 101–111)
Creatinine, Ser: 0.9 mg/dL (ref 0.61–1.24)
GFR calc Af Amer: >60 mL/min (ref >60)
GFR calc non Af Amer: >60 mL/min (ref >60)
Glucose, Bld: 385 mg/dL — ABNORMAL HIGH (ref 65–99)
Potassium: 3.9 mmol/L (ref 3.5–5.1)
Sodium: 131 mmol/L — ABNORMAL LOW (ref 135–145)
Total Bilirubin: 0.2 mg/dL — ABNORMAL LOW (ref 0.3–1.2)
Total Protein: 7.4 g/dL (ref 6.5–8.1)

## 2016-05-05 LAB — CBC
HCT: 45.5 % (ref 40.0–52.0)
Hemoglobin: 15.7 g/dL (ref 13.0–18.0)
MCH: 29.5 pg (ref 26.0–34.0)
MCHC: 34.4 g/dL (ref 32.0–36.0)
MCV: 85.7 fL (ref 80.0–100.0)
Platelets: 273 10*3/uL (ref 150–440)
RBC: 5.31 MIL/uL (ref 4.40–5.90)
RDW: 14.3 % (ref 11.5–14.5)
WBC: 9.9 10*3/uL (ref 3.8–10.6)

## 2016-05-05 MED ORDER — PIPERACILLIN-TAZOBACTAM 3.375 G IVPB 30 MIN
3.3750 g | Freq: Once | INTRAVENOUS | Status: AC
  Administered 2016-05-05: 3.375 g via INTRAVENOUS
  Filled 2016-05-05: qty 50

## 2016-05-05 MED ORDER — ONDANSETRON HCL 4 MG/2ML IJ SOLN
4.0000 mg | Freq: Once | INTRAMUSCULAR | Status: AC
  Administered 2016-05-05: 4 mg via INTRAVENOUS
  Filled 2016-05-05: qty 2

## 2016-05-05 MED ORDER — MORPHINE SULFATE (PF) 4 MG/ML IV SOLN
4.0000 mg | Freq: Once | INTRAVENOUS | Status: AC
  Administered 2016-05-05: 4 mg via INTRAVENOUS
  Filled 2016-05-05: qty 1

## 2016-05-05 MED ORDER — VANCOMYCIN HCL IN DEXTROSE 1-5 GM/200ML-% IV SOLN
1000.0000 mg | Freq: Once | INTRAVENOUS | Status: AC
  Administered 2016-05-05: 1000 mg via INTRAVENOUS
  Filled 2016-05-05: qty 200

## 2016-05-05 NOTE — ED Triage Notes (Signed)
Pt states that he has been having treatment to the rt foot for a diabetic ulcer, pt states that the pain has increased, odor noted from the ulcer, size approx of a nickel, draining color yellow/green

## 2016-05-05 NOTE — ED Provider Notes (Signed)
[facility] Emergency Department Provider Note   ____________________________________________    I have reviewed the triage vital signs and the nursing notes.   HISTORY  Chief Complaint Foot Ulcer     HPI [name] [name] is a 55 y.o. male presents with foot pain. Patient has a chronic diabetic foot ulcer on the bottom of his right foot which is managed by his physicians at [facility], recently he has developed increasing pain over the last 2 days with redness and discomfort extending up his calf as well. He denies fevers. Patient also has a history of peripheral artery disease, he does continue to smoke   Past Medical History:  Diagnosis Date  . Arthritis   . Asthma   . Atherosclerosis   . BPH (benign prostatic hyperplasia)   . Chronic diastolic CHF (congestive heart failure) ([place])    a. 02/2013 EF 50% by LV gram.  . COPD (chronic obstructive pulmonary disease) ([place])   . Coronary artery disease    a. Status post LAD PCI in 2013 at [facility];  b. October 2014 - Nuclear stress test showed inferior wall defect->Cath: patent LAD stent with 90% stenosis in the proximal RCA ( 2.5 x 23 mm DES), EF 50%;  c. 02/2014 MV w/ large inferior scar->Cath: LAD 13m, LCX 43m, 20d, RCA 20p/m, patent stent->Med Rx.  . Diabetes mellitus without complication ([place])   . Hernia   . Hyperlipidemia   . Hypertension   . Morbid obesity ([place])   . Neuropathy ([place])   . PAD (peripheral artery disease) ([place])    a. Angiography in March of 2012 showed 70-80% proximal right SFA stenosis status post self-expanding stent placement (6X60 mm). Angiography in April 2012 showed proximal left SFA occlusion status post self-expanding stent placement (7 X unkonw length). Both done by Dr. Lucky Cowboy.  . Peripheral vascular disease ([place])   . Sleep apnea   . Syncope and collapse   . Tobacco use    a. 75+ yr hx - still smoking 1ppd, down from 2 ppd.  . Varicose vein     Patient Active Problem List   Diagnosis  Date Noted  . Cellulitis 11/21/2015  . Morbid obesity ([place]) 11/17/2014  . PAD (peripheral artery disease) ([place])   . Coronary artery disease   . Hyperlipidemia   . Hypertension   . Tobacco use     Past Surgical History:  Procedure Laterality Date  . ABDOMINAL AORTAGRAM N/A 06/23/2013   Procedure: ABDOMINAL Maxcine Ham;  Surgeon: Wellington Hampshire, MD;  Location: [facility];  Service: Cardiovascular;  Laterality: N/A;  . APPENDECTOMY    . CARDIAC CATHETERIZATION  10/14   [facility]; X1 STENT PROXIMAL RCA  . CARDIAC CATHETERIZATION  02/2010   [facility]  . [facility]  [facility]  . CLAVICLE SURGERY    . HERNIA REPAIR    . PERIPHERAL ARTERIAL STENT GRAFT     x2 left/right  . PERIPHERAL VASCULAR CATHETERIZATION N/A 07/06/2015   Procedure: Abdominal Aortogram w/Lower Extremity;  Surgeon: Algernon Huxley, MD;  Location: [facility] CV LAB;  Service: Cardiovascular;  Laterality: N/A;  . PERIPHERAL VASCULAR CATHETERIZATION  07/06/2015   Procedure: Lower Extremity Intervention;  Surgeon: Algernon Huxley, MD;  Location: [facility];  Service: Cardiovascular;;    Prior to Admission medications   Medication Sig Start Date End Date Taking? Authorizing Provider  albuterol (PROVENTIL HFA;VENTOLIN HFA) 108 (90 BASE) MCG/ACT inhaler Inhale 2 puffs into the lungs every  6 (six) hours as needed for wheezing or shortness of breath. 04/15/13   Wellington Hampshire, MD  amoxicillin-clavulanate (AUGMENTIN) 875-125 MG tablet Take 1 tablet by mouth 2 (two) times daily. 11/22/15   Demetrios Loll, MD  aspirin EC 81 MG tablet Take 1 tablet (81 mg total) by mouth daily. 05/11/15   Wellington Hampshire, MD  atorvastatin (LIPITOR) 40 MG tablet Take 40 mg by mouth at bedtime.    Historical Provider, MD  chlorpheniramine-HYDROcodone (TUSSIONEX PENNKINETIC ER) 10-8 MG/5ML SUER Take 5 mLs by mouth every 12 (twelve) hours as needed for cough.    Historical Provider, MD  ciprofloxacin (CIPRO) 500 MG tablet Take 1 tablet (500 mg  total) by mouth 2 (two) times daily. 11/22/15   Demetrios Loll, MD  clopidogrel (PLAVIX) 75 MG tablet Take 1 tablet (75 mg total) by mouth daily. 07/06/15   Algernon Huxley, MD  fluticasone (FLONASE) 50 MCG/ACT nasal spray Place 2 sprays into both nostrils daily.    Historical Provider, MD  fluticasone furoate-vilanterol (BREO ELLIPTA) 100-25 MCG/INH AEPB Inhale 1 puff into the lungs daily.    Historical Provider, MD  gabapentin (NEURONTIN) 800 MG tablet Take 800 mg by mouth 3 (three) times daily.    Historical Provider, MD  insulin glargine (LANTUS) 100 unit/mL SOPN Inject 0.45 mLs (45 Units total) into the skin 2 (two) times daily. 11/22/15   Demetrios Loll, MD  insulin lispro (HUMALOG) 100 UNIT/ML injection Inject 0-10 Units into the skin 3 (three) times daily with meals as needed for high blood sugar. Pt uses as needed per sliding scale:    0-150:  0 units  151-200:  2 units  201-250:  4 units  251-300:  6 units  301-350:  8 units  351-400:  10 units  Greater than 400:  10 units and call MD    Historical Provider, MD  meclizine (ANTIVERT) 25 MG tablet Take 1 tablet (25 mg total) by mouth 3 (three) times daily as needed for dizziness. 04/09/15   Orbie Pyo, MD  metFORMIN (GLUCOPHAGE) 1000 MG tablet Take 1,000 mg by mouth 2 (two) times daily with a meal.    Historical Provider, MD  nitroGLYCERIN (NITRODUR - DOSED IN MG/24 HR) 0.4 mg/hr patch Place 0.4 mg onto the skin daily as needed (for chest pain).     Historical Provider, MD  nitroGLYCERIN (NITROSTAT) 0.4 MG SL tablet Place 0.4 mg under the tongue every 5 (five) minutes as needed for chest pain.    Historical Provider, MD  oxyCODONE-acetaminophen (ROXICET) 5-325 MG tablet Take 1 tablet by mouth every 4 (four) hours as needed for severe pain. 07/10/15   Paulette Blanch, MD     Allergies Patient has no known allergies.  Family History  Problem Relation Age of Onset  . Heart attack Father   . Heart disease Father   . Hypertension Father   .  Hyperlipidemia Father   . Hypertension Mother   . Hyperlipidemia Mother     Social History Social History  Substance Use Topics  . Smoking status: Current Every Day Smoker    Packs/day: 1.00    Years: 41.00    Types: Cigarettes  . Smokeless tobacco: Not on file  . Alcohol use No    Review of Systems  Constitutional: No fever/chills  Cardiovascular: Denies chest pain. Respiratory: Chronic shortness of breath, "smoker's cough Gastrointestinal:No nausea, no vomiting.   Genitourinary: Negative for dysuria. Musculoskeletal: Foot pain as above Skin: Redness around ulceration  10-point ROS otherwise negative.  ____________________________________________   PHYSICAL EXAM:  VITAL SIGNS: ED Triage Vitals [05/05/16 1948]  Enc Vitals Group     BP (!) 157/85     Pulse Rate (!) 113     Resp 20     Temp 98.8 F (37.1 C)     Temp Source Oral     SpO2 95 %     Weight 260 lb (117.9 kg)     Height 5\' 6"  (1.676 m)     Head Circumference      Peak Flow      Pain Score 10     Pain Loc      Pain Edu?      Excl. in [place]?     Constitutional: Alert and oriented. No acute distress. Pleasant and interactive Eyes: Conjunctivae are normal.   Nose: No congestion/rhinnorhea. Mouth/Throat: Mucous membranes are moist.    Cardiovascular: Tachycardia, regular rhythm. Grossly normal heart sounds.  Good peripheral circulation. Respiratory: Normal respiratory effort.  No retractions. Scattered wheezes Gastrointestinal: Soft and nontender. No distention.  No CVA tenderness. Genitourinary: deferred Musculoskeletal: Proximal 2 x 2 centimeter ulcer on the bottom of the right central foot, mild erythema surrounding. No significant discharge.  Warm and well perfused, 1+ DP pulses right foot Neurologic:  Normal speech and language. No gross focal neurologic deficits are appreciated.  Skin:  Skin is warm, dry and intact. No rash noted. Psychiatric: Mood and affect are normal. Speech and behavior  are normal.  ____________________________________________   LABS (all labs ordered are listed, but only abnormal results are displayed)  Labs Reviewed  COMPREHENSIVE METABOLIC PANEL - Abnormal; Notable for the following:       Result Value   Sodium 131 (*)    Chloride 95 (*)    Glucose, Bld 385 (*)    ALT 13 (*)    Total Bilirubin 0.2 (*)    All other components within normal limits  CULTURE, BLOOD (ROUTINE X 2)  CULTURE, BLOOD (ROUTINE X 2)  CBC   ____________________________________________  EKG  None ____________________________________________  RADIOLOGY  X-ray foot no subcutaneous gas seen ____________________________________________   PROCEDURES  Procedure(s) performed: No    Critical Care performed: No ____________________________________________   INITIAL IMPRESSION / ASSESSMENT AND PLAN / ED COURSE  Pertinent labs & imaging results that were available during my care of the patient were reviewed by me and considered in my medical decision making (see chart for details).  Patient with a history of diabetes and peripheral artery disease presents with worsening pain and smell from his chronic diabetic ulcer. He denies fever but reports pain is extending up his leg. Given his comorbidities I'm concerned about an infection, we will give IV antibiotics and admitted to the hospital  Clinical Course    ____________________________________________   FINAL CLINICAL IMPRESSION(S) / ED DIAGNOSES  Final diagnoses:  Wound infection      NEW MEDICATIONS STARTED DURING THIS VISIT:  New Prescriptions   No medications on file     Note:  This document was prepared using Dragon voice recognition software and may include unintentional dictation errors.    Lavonia Drafts, MD 05/05/16 2250

## 2016-05-06 ENCOUNTER — Inpatient Hospital Stay: Payer: Self-pay

## 2016-05-06 DIAGNOSIS — L8990 Pressure ulcer of unspecified site, unspecified stage: Secondary | ICD-10-CM | POA: Insufficient documentation

## 2016-05-06 LAB — CBC
HCT: 43.7 % (ref 40.0–52.0)
Hemoglobin: 15 g/dL (ref 13.0–18.0)
MCH: 29.6 pg (ref 26.0–34.0)
MCHC: 34.3 g/dL (ref 32.0–36.0)
MCV: 86.4 fL (ref 80.0–100.0)
Platelets: 242 10*3/uL (ref 150–440)
RBC: 5.06 MIL/uL (ref 4.40–5.90)
RDW: 14.4 % (ref 11.5–14.5)
WBC: 8.2 10*3/uL (ref 3.8–10.6)

## 2016-05-06 LAB — BASIC METABOLIC PANEL
Anion gap: 7 (ref 5–15)
BUN: 11 mg/dL (ref 6–20)
CO2: 30 mmol/L (ref 22–32)
Calcium: 9.2 mg/dL (ref 8.9–10.3)
Chloride: 99 mmol/L — ABNORMAL LOW (ref 101–111)
Creatinine, Ser: 0.96 mg/dL (ref 0.61–1.24)
GFR calc Af Amer: >60 mL/min (ref >60)
GFR calc non Af Amer: >60 mL/min (ref >60)
Glucose, Bld: 305 mg/dL — ABNORMAL HIGH (ref 65–99)
Potassium: 4 mmol/L (ref 3.5–5.1)
Sodium: 136 mmol/L (ref 135–145)

## 2016-05-06 LAB — MRSA PCR SCREENING: MRSA by PCR: NEGATIVE

## 2016-05-06 LAB — GLUCOSE, CAPILLARY
Glucose-Capillary: 147 mg/dL — ABNORMAL HIGH (ref 65–99)
Glucose-Capillary: 251 mg/dL — ABNORMAL HIGH (ref 65–99)
Glucose-Capillary: 287 mg/dL — ABNORMAL HIGH (ref 65–99)
Glucose-Capillary: 322 mg/dL — ABNORMAL HIGH (ref 65–99)
Glucose-Capillary: 341 mg/dL — ABNORMAL HIGH (ref 65–99)

## 2016-05-06 MED ORDER — INSULIN ASPART 100 UNIT/ML ~~LOC~~ SOLN
0.0000 [IU] | Freq: Three times a day (TID) | SUBCUTANEOUS | Status: DC
  Administered 2016-05-06: 2 [IU] via SUBCUTANEOUS
  Administered 2016-05-06: 8 [IU] via SUBCUTANEOUS
  Administered 2016-05-07: 5 [IU] via SUBCUTANEOUS
  Filled 2016-05-06 (×2): qty 8

## 2016-05-06 MED ORDER — ALUM & MAG HYDROXIDE-SIMETH 200-200-20 MG/5ML PO SUSP: 15.0000 mL | ORAL | Status: DC | PRN

## 2016-05-06 MED ORDER — SODIUM CHLORIDE 0.9 % IV SOLN: INTRAVENOUS | Status: DC

## 2016-05-06 MED ORDER — ALBUTEROL SULFATE (2.5 MG/3ML) 0.083% IN NEBU: 3.0000 mL | INHALATION_SOLUTION | Freq: Four times a day (QID) | RESPIRATORY_TRACT | Status: DC | PRN

## 2016-05-06 MED ORDER — MORPHINE SULFATE (PF) 2 MG/ML IV SOLN
2.0000 mg | INTRAVENOUS | Status: DC | PRN
  Administered 2016-05-06 – 2016-05-07 (×5): 2 mg via INTRAVENOUS
  Filled 2016-05-06 (×6): qty 1

## 2016-05-06 MED ORDER — BISACODYL 5 MG PO TBEC: 5.0000 mg | DELAYED_RELEASE_TABLET | Freq: Every day | ORAL | Status: DC | PRN

## 2016-05-06 MED ORDER — ACETAMINOPHEN 650 MG RE SUPP
650.0000 mg | Freq: Four times a day (QID) | RECTAL | Status: DC | PRN
Start: 2016-05-06 — End: 2016-05-07

## 2016-05-06 MED ORDER — GABAPENTIN 400 MG PO CAPS
800.0000 mg | ORAL_CAPSULE | Freq: Three times a day (TID) | ORAL | Status: DC
  Administered 2016-05-06 – 2016-05-07 (×4): 800 mg via ORAL
  Filled 2016-05-06 (×4): qty 2

## 2016-05-06 MED ORDER — COLLAGENASE 250 UNIT/GM EX OINT
TOPICAL_OINTMENT | Freq: Every day | CUTANEOUS | Status: DC
  Administered 2016-05-07: 1 via TOPICAL
  Filled 2016-05-06: qty 30

## 2016-05-06 MED ORDER — INSULIN GLARGINE 100 UNIT/ML ~~LOC~~ SOLN
45.0000 [IU] | Freq: Two times a day (BID) | SUBCUTANEOUS | Status: DC
  Administered 2016-05-06 – 2016-05-07 (×3): 45 [IU] via SUBCUTANEOUS
  Filled 2016-05-06 (×5): qty 0.45

## 2016-05-06 MED ORDER — ZOLPIDEM TARTRATE 5 MG PO TABS
5.0000 mg | ORAL_TABLET | Freq: Every evening | ORAL | Status: DC | PRN
  Administered 2016-05-06: 5 mg via ORAL
  Filled 2016-05-06: qty 1

## 2016-05-06 MED ORDER — ENOXAPARIN SODIUM 40 MG/0.4ML ~~LOC~~ SOLN: 40.0000 mg | SUBCUTANEOUS | Status: DC

## 2016-05-06 MED ORDER — VANCOMYCIN HCL 10 G IV SOLR
1250.0000 mg | Freq: Three times a day (TID) | INTRAVENOUS | Status: DC
  Administered 2016-05-06 – 2016-05-07 (×4): 1250 mg via INTRAVENOUS
  Filled 2016-05-06 (×6): qty 1250

## 2016-05-06 MED ORDER — PIPERACILLIN-TAZOBACTAM 3.375 G IVPB
3.3750 g | Freq: Three times a day (TID) | INTRAVENOUS | Status: DC
  Administered 2016-05-06 – 2016-05-07 (×4): 3.375 g via INTRAVENOUS
  Filled 2016-05-06 (×3): qty 50

## 2016-05-06 MED ORDER — INSULIN GLARGINE 100 UNITS/ML SOLOSTAR PEN
45.0000 [IU] | PEN_INJECTOR | Freq: Two times a day (BID) | SUBCUTANEOUS | Status: DC
Start: 2016-05-06 — End: 2016-05-06

## 2016-05-06 MED ORDER — MAGNESIUM CITRATE PO SOLN
1.0000 | Freq: Once | ORAL | Status: DC | PRN
  Filled 2016-05-06: qty 296

## 2016-05-06 MED ORDER — ONDANSETRON HCL 4 MG/2ML IJ SOLN: 4.0000 mg | Freq: Four times a day (QID) | INTRAMUSCULAR | Status: DC | PRN

## 2016-05-06 MED ORDER — ACETAMINOPHEN 325 MG PO TABS: 650.0000 mg | ORAL_TABLET | Freq: Four times a day (QID) | ORAL | Status: DC | PRN

## 2016-05-06 MED ORDER — CLONAZEPAM 0.5 MG PO TABS
1.0000 mg | ORAL_TABLET | Freq: Every day | ORAL | Status: DC
  Administered 2016-05-06 (×2): 1 mg via ORAL
  Filled 2016-05-06 (×2): qty 2

## 2016-05-06 MED ORDER — NITROGLYCERIN 0.4 MG/HR TD PT24
0.4000 mg | MEDICATED_PATCH | Freq: Every day | TRANSDERMAL | Status: DC | PRN
  Filled 2016-05-06: qty 1

## 2016-05-06 MED ORDER — CLOPIDOGREL BISULFATE 75 MG PO TABS
75.0000 mg | ORAL_TABLET | Freq: Every day | ORAL | Status: DC
  Administered 2016-05-06 – 2016-05-07 (×2): 75 mg via ORAL
  Filled 2016-05-06 (×2): qty 1

## 2016-05-06 MED ORDER — ASPIRIN EC 81 MG PO TBEC
81.0000 mg | DELAYED_RELEASE_TABLET | Freq: Every day | ORAL | Status: DC
  Administered 2016-05-06 – 2016-05-07 (×2): 81 mg via ORAL
  Filled 2016-05-06 (×2): qty 1

## 2016-05-06 MED ORDER — ONDANSETRON HCL 4 MG PO TABS: 4.0000 mg | ORAL_TABLET | Freq: Four times a day (QID) | ORAL | Status: DC | PRN

## 2016-05-06 MED ORDER — OXYCODONE-ACETAMINOPHEN 5-325 MG PO TABS
1.0000 | ORAL_TABLET | ORAL | Status: DC | PRN
  Administered 2016-05-06 – 2016-05-07 (×7): 1 via ORAL
  Filled 2016-05-06 (×7): qty 1

## 2016-05-06 MED ORDER — NITROGLYCERIN 0.4 MG SL SUBL: 0.4000 mg | SUBLINGUAL_TABLET | SUBLINGUAL | Status: DC | PRN

## 2016-05-06 MED ORDER — INSULIN ASPART 100 UNIT/ML ~~LOC~~ SOLN
0.0000 [IU] | Freq: Every day | SUBCUTANEOUS | Status: DC
  Administered 2016-05-06 (×2): 4 [IU] via SUBCUTANEOUS
  Filled 2016-05-06 (×2): qty 4

## 2016-05-06 MED ORDER — ENOXAPARIN SODIUM 40 MG/0.4ML ~~LOC~~ SOLN
40.0000 mg | Freq: Two times a day (BID) | SUBCUTANEOUS | Status: DC
  Administered 2016-05-06 – 2016-05-07 (×3): 40 mg via SUBCUTANEOUS
  Filled 2016-05-06 (×3): qty 0.4

## 2016-05-06 MED ORDER — INSULIN GLARGINE 100 UNITS/ML SOLOSTAR PEN: 45.0000 [IU] | PEN_INJECTOR | Freq: Two times a day (BID) | SUBCUTANEOUS | Status: DC

## 2016-05-06 MED ORDER — GLIPIZIDE 10 MG PO TABS
10.0000 mg | ORAL_TABLET | Freq: Every day | ORAL | Status: DC
  Administered 2016-05-06 – 2016-05-07 (×2): 10 mg via ORAL
  Filled 2016-05-06 (×2): qty 1

## 2016-05-06 MED ORDER — ENOXAPARIN SODIUM 40 MG/0.4ML ~~LOC~~ SOLN: 40.0000 mg | Freq: Two times a day (BID) | SUBCUTANEOUS | Status: DC

## 2016-05-06 MED ORDER — SENNOSIDES-DOCUSATE SODIUM 8.6-50 MG PO TABS: 1.0000 | ORAL_TABLET | Freq: Every evening | ORAL | Status: DC | PRN

## 2016-05-06 NOTE — Progress Notes (Signed)
Pharmacy note:  Lovenox changed to 40 mg BID for BMI > 40 per protocol.

## 2016-05-06 NOTE — Care Management Note (Signed)
Case Management Note  Patient Details  Name: [name] [name] MRN: [id number] Date of Birth: Aug 01, 1960  Subjective/Objective:   Met with patient at bedside. He lives at home with his girlfriend. He uses crutches and a wheelchair. He continues to drive. He lost is insurance this month due to being out of work for 4 months. Application given for [facility] and [facility]. Explained in detail how to access both of these services.  Patient is familiar with his wound care. Case closed.                 Action/Plan:   Expected Discharge Date:  05/08/16               Expected Discharge Plan:  Home/Self Care  In-House Referral:     Discharge planning Services  CM Consult, [facility], Medication Assistance  Post Acute Care Choice:  NA Choice offered to:     DME Arranged:    DME Agency:     HH Arranged:    HH Agency:     Status of Service:     If discussed at [employer], dates discussed:    Additional Comments:  Jolly Mango, RN 05/06/2016, 3:54 PM

## 2016-05-06 NOTE — Progress Notes (Addendum)
[place] [facility] [place] NAME: [name] [name]    MR#:  [id number]  DATE OF BIRTH:  Aug 17, 1960  SUBJECTIVE:  Patient has not had follow-up for diabetic foot wound for over one month to financial reasons  REVIEW OF SYSTEMS:    Review of Systems  Constitutional: Negative.  Negative for chills, fever and malaise/fatigue.  HENT: Negative.  Negative for ear discharge, ear pain, hearing loss, nosebleeds and sore throat.   Eyes: Negative.  Negative for blurred vision and pain.  Respiratory: Negative.  Negative for cough, hemoptysis, shortness of breath and wheezing.   Cardiovascular: Negative.  Negative for chest pain, palpitations and leg swelling.  Gastrointestinal: Negative.  Negative for abdominal pain, blood in stool, diarrhea, nausea and vomiting.  Genitourinary: Negative.  Negative for dysuria.  Musculoskeletal: Negative.  Negative for back pain.  Skin:       Diabetic foot infection  Neurological: Negative for dizziness, tremors, speech change, focal weakness, seizures and headaches.  Endo/Heme/Allergies: Negative.  Does not bruise/bleed easily.  Psychiatric/Behavioral: Negative.  Negative for depression, hallucinations and suicidal ideas.    Tolerating Diet:yes      DRUG ALLERGIES:  No Known Allergies  VITALS:  Blood pressure 137/85, pulse (!) 108, temperature 97.9 F (36.6 C), temperature source Oral, resp. rate 20, height 5\' 6"  (1.676 m), weight 115.2 kg (253 lb 14.4 oz), SpO2 94 %.  PHYSICAL EXAMINATION:   Physical Exam  Constitutional: [name] [name] is oriented to person, place, and time and well-developed, well-nourished, and in no distress. No distress.  HENT:  Head: Normocephalic.  Eyes: No scleral icterus.  Neck: Normal range of motion. Neck supple. No JVD present. No tracheal deviation present.  Cardiovascular: Normal rate, regular rhythm and normal heart sounds.  Exam reveals no gallop and no friction rub.   No murmur  heard. Pulmonary/Chest: Effort normal and breath sounds normal. No respiratory distress. [name] [name] has no wheezes. [name] [name] has no rales. [name] [name] exhibits no tenderness.  Abdominal: Soft. Bowel sounds are normal. [name] [name] exhibits no distension and no mass. There is no tenderness. There is no rebound and no guarding.  Musculoskeletal: Normal range of motion. [name] [name] exhibits edema.  Neurological: [name] [name] is alert and oriented to person, place, and time.  Skin: Skin is warm. No rash noted. No erythema.  Right foot with decreased sensation in there is a wound about the size of the nickle that penetrates deep to tendon.  Psychiatric: Affect and judgment normal.      LABORATORY PANEL:   CBC  Recent Labs Lab 05/06/16 0518  WBC 8.2  HGB 15.0  HCT 43.7  PLT 242   ------------------------------------------------------------------------------------------------------------------  Chemistries   Recent Labs Lab 05/05/16 2007 05/06/16 0518  NA 131* 136  K 3.9 4.0  CL 95* 99*  CO2 28 30  GLUCOSE 385* 305*  BUN 10 11  CREATININE 0.90 0.96  CALCIUM 9.5 9.2  AST 21  --   ALT 13*  --   ALKPHOS 88  --   BILITOT 0.2*  --    ------------------------------------------------------------------------------------------------------------------  Cardiac Enzymes No results for input(s): TROPONINI in the last 168 hours. ------------------------------------------------------------------------------------------------------------------  RADIOLOGY:  Dg Foot Complete Right  Result Date: 05/05/2016 CLINICAL DATA:  Patient is being treated for diabetic ulcer of the bottom of the right foot. Increasing pain with over noticed from the ulcer. Draining pus. EXAM: RIGHT FOOT COMPLETE - 3+ VIEW COMPARISON:  11/21/2015 FINDINGS: Large soft tissue ulceration along the plantar surface of the foot at  the level of the tarsometatarsal joints. There is underlying soft tissue swelling. No radiopaque foreign body or soft tissue gas identified.  There is suggestion of lucency and sclerosis at the base of the third metatarsal bone. This is nonspecific and there is limited visualization of this aerated bone overlap but this could be an area of osteomyelitis. Otherwise, no bone erosion or cortical changes are identified. Degenerative changes in the intertarsal joints. Small calcaneal spurs. No acute fracture or dislocation. IMPRESSION: Soft tissue ulceration to the plantar surface of the right foot at the level of the tarsometatarsal joints. Possible lucency and sclerosis at the base of third metatarsal bone which could indicate focal osteomyelitis. Visualization is limited due to bone overlap. Electronically Signed   By: Lucienne Capers M.D.   On: 05/05/2016 22:10     ASSESSMENT AND PLAN:    55 y.o. male with a history of CAD, CHF, DM, HTN, HLD, PAD, chronic diabetic foot ulcer Admitted with diabetic foot infection and possible osteomyelitis   1. Diabetic foot infection: Follow-up on MRI to evaluate for osteomyelitis  Further recommendations after Podiatry consultation, suspect patient will need at minimum I&D Continue Zosyn and vancomycin  Wound care consult 2. Right calf tenderness: Likely due to edema, but does have palpable torturous cord  Lower extremity Doppler pending 3. Hyponatremia: Improved IV fluids   4. Diabetes with bilateral neuropathy: Continue ADA diet and sliding scale insulin with glipizide and Lantus 5. History of coronary artery disease: Continue aspirin, Plavix and nitroglycerin 6. History of peripheral vascular disease-continue Plavix 7. Neuropathy due to diabetes: Continue gabapentin 8. TObacco Dependence: Encouraged to stop smoking as this is harming his overall health and healing process. Patient aware of health risks. Counseled 3 minutes. Rounded with nursing staff  Management plans discussed with the patient and [name] [name] is in agreement.  CODE STATUS: full  TOTAL TIME TAKING CARE OF THIS PATIENT: 33  minutes.     POSSIBLE D/C 3 days, DEPENDING ON CLINICAL CONDITION.   Yanai Hobson M.D on 05/06/2016 at 8:02 AM  Between 7am to 6pm - Pager - [phone] After 6pm go to [email or web address] - password [email or web address] [facility]  Office  [phone]  CC: Primary care physician; Clarisse Gouge, MD  Note: This dictation was prepared with Dragon dictation along with smaller phrase technology. Any transcriptional errors that result from this process are unintentional.

## 2016-05-06 NOTE — H&P (Signed)
[place] @ [facility] Admission History and Physical Harvie Bridge, D.O.  ---------------------------------------------------------------------------------------------------------------------   PATIENT NAME: [name] [name] MR#: [id number] DATE OF BIRTH: Oct 22, 1960 DATE OF ADMISSION: 05/05/2016 PRIMARY CARE PHYSICIAN: Clarisse Gouge, MD  REQUESTING/REFERRING PHYSICIAN: ED Dr. Corky Downs  CHIEF COMPLAINT: Chief Complaint  Patient presents with  . Foot Ulcer    HISTORY OF PRESENT ILLNESS: [name] [name] is a 55 y.o. male with a known history of CAD, CHF, DM, HTN, HLD, PAD presents to the emergency department complaining of increasing Right foot pain at the site of chronic diabetic foot ulcer associated with swelling and calf tenderness over the last 2 days.  Patient is usually followed at [facility].  He denies fevers, purulent drainage.   Otherwise there has been no change in status. Patient has been taking medication as prescribed and there has been no recent change in medication or diet.  There has been no recent illness, travel or sick contacts.    Patient denies fevers/chills, weakness, dizziness, chest pain, shortness of breath, N/V/C/D, abdominal pain, dysuria/frequency, changes in mental status.   EMS/ED COURSE:   Patient received Vancomycin and Zosyn.  PAST MEDICAL HISTORY: Past Medical History:  Diagnosis Date  . Arthritis   . Asthma   . Atherosclerosis   . BPH (benign prostatic hyperplasia)   . Chronic diastolic CHF (congestive heart failure) ([place])    a. 02/2013 EF 50% by LV gram.  . COPD (chronic obstructive pulmonary disease) ([place])   . Coronary artery disease    a. Status post LAD PCI in 2013 at [facility];  b. October 2014 - Nuclear stress test showed inferior wall defect->Cath: patent LAD stent with 90% stenosis in the proximal RCA ( 2.5 x 23 mm DES), EF 50%;  c. 02/2014 MV w/ large inferior scar->Cath: LAD 43m, LCX 41m, 20d, RCA 20p/m, patent stent->Med Rx.  .  Diabetes mellitus without complication ([place])   . Hernia   . Hyperlipidemia   . Hypertension   . Morbid obesity ([place])   . Neuropathy ([place])   . PAD (peripheral artery disease) ([place])    a. Angiography in March of 2012 showed 70-80% proximal right SFA stenosis status post self-expanding stent placement (6X60 mm). Angiography in April 2012 showed proximal left SFA occlusion status post self-expanding stent placement (7 X unkonw length). Both done by Dr. Lucky Cowboy.  . Peripheral vascular disease ([place])   . Sleep apnea   . Syncope and collapse   . Tobacco use    a. 75+ yr hx - still smoking 1ppd, down from 2 ppd.  . Varicose vein       PAST SURGICAL HISTORY: Past Surgical History:  Procedure Laterality Date  . ABDOMINAL AORTAGRAM N/A 06/23/2013   Procedure: ABDOMINAL Maxcine Ham;  Surgeon: Wellington Hampshire, MD;  Location: [facility] CATH [facility];  Service: Cardiovascular;  Laterality: N/A;  . APPENDECTOMY    . CARDIAC CATHETERIZATION  10/14   [facility]; X1 STENT PROXIMAL RCA  . CARDIAC CATHETERIZATION  02/2010   [facility]  . CARDIAC CATHETERIZATION  02/21/2014   [facility]  . CLAVICLE SURGERY    . HERNIA REPAIR    . PERIPHERAL ARTERIAL STENT GRAFT     x2 left/right  . PERIPHERAL VASCULAR CATHETERIZATION N/A 07/06/2015   Procedure: Abdominal Aortogram w/Lower Extremity;  Surgeon: Algernon Huxley, MD;  Location: [facility] CV LAB;  Service: Cardiovascular;  Laterality: N/A;  . PERIPHERAL VASCULAR CATHETERIZATION  07/06/2015   Procedure: Lower Extremity Intervention;  Surgeon: Algernon Huxley, MD;  Location:  [facility] CV [facility];  Service: Cardiovascular;;      SOCIAL HISTORY: Social History  Substance Use Topics  . Smoking status: Current Every Day Smoker    Packs/day: 1.00    Years: 41.00    Types: Cigarettes  . Smokeless tobacco: Not on file  . Alcohol use No      FAMILY HISTORY: Family History  Problem Relation Age of Onset  . Heart attack Father   . Heart disease Father   . Hypertension Father   .  Hyperlipidemia Father   . Hypertension Mother   . Hyperlipidemia Mother      MEDICATIONS AT HOME: Prior to Admission medications   Medication Sig Start Date End Date Taking? Authorizing Provider  albuterol (PROVENTIL HFA;VENTOLIN HFA) 108 (90 BASE) MCG/ACT inhaler Inhale 2 puffs into the lungs every 6 (six) hours as needed for wheezing or shortness of breath. 04/15/13  Yes Wellington Hampshire, MD  aspirin EC 81 MG tablet Take 1 tablet (81 mg total) by mouth daily. 05/11/15  Yes Wellington Hampshire, MD  clonazePAM (KLONOPIN) 1 MG tablet Take 1 mg by mouth at bedtime.   Yes Historical Provider, MD  clopidogrel (PLAVIX) 75 MG tablet Take 1 tablet (75 mg total) by mouth daily. 07/06/15  Yes Algernon Huxley, MD  gabapentin (NEURONTIN) 800 MG tablet Take 800 mg by mouth 3 (three) times daily.   Yes Historical Provider, MD  glipiZIDE (GLUCOTROL) 10 MG tablet Take 10 mg by mouth daily before breakfast.   Yes Historical Provider, MD  insulin glargine (LANTUS) 100 unit/mL SOPN Inject 0.45 mLs (45 Units total) into the skin 2 (two) times daily. Patient taking differently: Inject 80 Units into the skin daily.  11/22/15  Yes Demetrios Loll, MD  metFORMIN (GLUCOPHAGE) 1000 MG tablet Take 1,000 mg by mouth 2 (two) times daily with a meal.   Yes Historical Provider, MD  nitroGLYCERIN (NITRODUR - DOSED IN MG/24 HR) 0.4 mg/hr patch Place 0.4 mg onto the skin daily as needed (for chest pain).    Yes Historical Provider, MD  nitroGLYCERIN (NITROSTAT) 0.4 MG SL tablet Place 0.4 mg under the tongue every 5 (five) minutes as needed for chest pain.   Yes Historical Provider, MD  oxyCODONE-acetaminophen (ROXICET) 5-325 MG tablet Take 1 tablet by mouth every 4 (four) hours as needed for severe pain. 07/10/15  Yes Paulette Blanch, MD      DRUG ALLERGIES: No Known Allergies   REVIEW OF SYSTEMS: CONSTITUTIONAL: No fatigue, weakness, fever, chills, weight gain/loss, headache EYES: No blurry or double vision. ENT: No tinnitus, postnasal  drip, redness or soreness of the oropharynx. RESPIRATORY: No dyspnea, cough, wheeze, hemoptysis. CARDIOVASCULAR: No chest pain, orthopnea, palpitations, syncope. GASTROINTESTINAL: No nausea, vomiting, constipation, diarrhea, abdominal pain. No hematemesis, melena or hematochezia. GENITOURINARY: No dysuria, frequency, hematuria. ENDOCRINE: No polyuria or nocturia. No heat or cold intolerance. HEMATOLOGY: No anemia, bruising, bleeding. INTEGUMENTARY: No rashes, ulcers, lesions. MUSCULOSKELETAL: No pain, arthritis, swelling, gout. Positive for right foot and calf pain NEUROLOGIC: No numbness, tingling, weakness or ataxia. No seizure-type activity. PSYCHIATRIC: No anxiety, depression, insomnia.  PHYSICAL EXAMINATION: VITAL SIGNS: Blood pressure 133/88, pulse 99, temperature 98 F (36.7 C), temperature source Oral, resp. rate 18, height 5\' 6"  (1.676 m), weight 115.2 kg (253 lb 14.4 oz), SpO2 96 %.  GENERAL: 55 y.o.-year-old male patient, well-developed, well-nourished lying in the bed in no acute distress.  Pleasant and cooperative.   HEENT: Head atraumatic, normocephalic. Pupils equal, round, reactive to light and  accommodation. No scleral icterus. Extraocular muscles intact. Oropharynx is clear. Mucus membranes moist. NECK: Supple, full range of motion. No JVD, no bruit heard. No cervical lymphadenopathy. CHEST: Normal breath sounds bilaterally. No wheezing, rales, rhonchi or crackles. No use of accessory muscles of respiration.  No reproducible chest wall tenderness.  CARDIOVASCULAR: S1, S2 normal. No murmurs, rubs, or gallops appreciated. Cap refill <2 seconds. ABDOMEN: Soft, nontender, nondistended. No rebound, guarding, rigidity. Normoactive bowel sounds present in all four quadrants. No organomegaly or mass. EXTREMITIES:  No pedal edema, cyanosis, or clubbing. 2 x 2 centimeter ulcer in the right mid foot on the plantar aspect with erythema. Positive calf tenderness of the right lower  extremity. Pulses intact distally. NEUROLOGIC: Cranial nerves II through XII are grossly intact with no focal sensorimotor deficit. Muscle strength 5/5 in all extremities. Sensation intact. Gait not checked. PSYCHIATRIC: The patient is alert and oriented x 3. Normal affect, mood, thought content.  LABORATORY PANEL:  CBC  Recent Labs Lab 05/05/16 2007  WBC 9.9  HGB 15.7  HCT 45.5  PLT 273   ----------------------------------------------------------------------------------------------------------------- Chemistries  Recent Labs Lab 05/05/16 2007  NA 131*  K 3.9  CL 95*  CO2 28  GLUCOSE 385*  BUN 10  CREATININE 0.90  CALCIUM 9.5  AST 21  ALT 13*  ALKPHOS 88  BILITOT 0.2*   ------------------------------------------------------------------------------------------------------------------ Cardiac Enzymes No results for input(s): TROPONINI in the last 168 hours. ------------------------------------------------------------------------------------------------------------------  RADIOLOGY: Dg Foot Complete Right  Result Date: 05/05/2016 CLINICAL DATA:  Patient is being treated for diabetic ulcer of the bottom of the right foot. Increasing pain with over noticed from the ulcer. Draining pus. EXAM: RIGHT FOOT COMPLETE - 3+ VIEW COMPARISON:  11/21/2015 FINDINGS: Large soft tissue ulceration along the plantar surface of the foot at the level of the tarsometatarsal joints. There is underlying soft tissue swelling. No radiopaque foreign body or soft tissue gas identified. There is suggestion of lucency and sclerosis at the base of the third metatarsal bone. This is nonspecific and there is limited visualization of this aerated bone overlap but this could be an area of osteomyelitis. Otherwise, no bone erosion or cortical changes are identified. Degenerative changes in the intertarsal joints. Small calcaneal spurs. No acute fracture or dislocation. IMPRESSION: Soft tissue ulceration to the  plantar surface of the right foot at the level of the tarsometatarsal joints. Possible lucency and sclerosis at the base of third metatarsal bone which could indicate focal osteomyelitis. Visualization is limited due to bone overlap. Electronically Signed   By: Lucienne Capers M.D.   On: 05/05/2016 22:10    IMPRESSION AND PLAN:  This is a 55 y.o. male with a history of CAD, CHF, DM, HTN, HLD, PAD, chronic diabetic foot ulcer now being admitted with: 1. Diabetic foot infection-admit to inpatient for IV antibiotics, wound culture, podiatry and wound care consult and MRI to rule out osteomyelitis given x-ray reading. 2. Right calf tenderness rule out DVT- bilateral lower extremity ultrasound 3. Hyponatremia, mild-we will provide IV fluids and recheck BMP in the a.m. 4. Diabetes with hyperglycemia-regular insulin sliding scale coverage. Continue glipizide and Lantus 5. History of coronary artery disease-continue aspirin, Plavix and nitroglycerin 6. History of peripheral vascular disease-continue gabapentin.  Diet/Nutrition: Heart healthy, carb controlled Fluids: IV normal saline DVT Px: Lovenox, SCDs and early ambulation Code Status: Full  All the records are reviewed and case discussed with ED provider. Management plans discussed with the patient and/or family who express understanding and agree with plan of  care.   TOTAL TIME TAKING CARE OF THIS PATIENT: 60 minutes.   Charlestine Rookstool D.O. on 05/06/2016 at 12:47 AM Between 7am to 6pm - Pager - [phone] After 6pm go to [email or web address] - Proofreader [employer] [facility] Office [phone] CC: Primary care physician; Clarisse Gouge, MD     Note: This dictation was prepared with Dragon dictation along with smaller phrase technology. Any transcriptional errors that result from this process are unintentional.

## 2016-05-06 NOTE — Consult Note (Signed)
Dresser Nurse wound consult note Reason for Consult: Right plantar foot neuropathic, full thickness ulcer.  Hx neuropathy and PVD. Podiatry consult pending. Seen by [facility].  Wound type:Neuropathic Pressure Ulcer POA: Yes Measurement: 2 cm x 2 cm x 0.5 cm with 2 cm peeling epithelium present circumferentially.  Wound bed is devitalized tissue. Surrounding edema, erythema and pain.  Wound FEX:MDYJWL Drainage (amount, consistency, odor) Minimal purulent effluent  Musty odor.  Periwound:Erythema, edema to RLE Dressing procedure/placement/frequency:Cleanse ulcer to right plantar foot with NS and pat gently dry. Apply Santyl to wound bed.  Cover with NS moist gauze.  Cover with 4x4 gauze, kerlix and tape  Change daily.   Will not follow at this time.  Please re-consult if needed.  Domenic Moras RN BSN [place] Pager [phone]

## 2016-05-06 NOTE — Consult Note (Signed)
Reason for Consult: Ulceration right foot with diabetes and neuropathy. Referring Physician: Hospitalist's  [name] [name] is an 55 y.o. male.  HPI: This is a 55 year old male with a chronic history 4 months with an ulceration on the bottom of his right foot. He has been seen down at [facility] and podiatry as well as the wound care center. Has had local wound care as well as offloading with a fracture boot and crutches. Has continued to ulcerate on the bottom of his right arch area. Recently had a flareup with some increased drainage and swelling and redness in the right leg. States it did start to become more painful. Was out of pain medications and decided to come in to have this evaluated.  Past Medical History:  Diagnosis Date  . Arthritis   . Asthma   . Atherosclerosis   . BPH (benign prostatic hyperplasia)   . Chronic diastolic CHF (congestive heart failure) ([place])    a. 02/2013 EF 50% by LV gram.  . COPD (chronic obstructive pulmonary disease) ([place])   . Coronary artery disease    a. Status post LAD PCI in 2013 at [facility];  b. October 2014 - Nuclear stress test showed inferior wall defect->Cath: patent LAD stent with 90% stenosis in the proximal RCA ( 2.5 x 23 mm DES), EF 50%;  c. 02/2014 MV w/ large inferior scar->Cath: LAD 66m LCX 357m20d, RCA 20p/m, patent stent->Med Rx.  . Diabetes mellitus without complication ([place]  . Hernia   . Hyperlipidemia   . Hypertension   . Morbid obesity ([place]  . Neuropathy ([facility]  . PAD (peripheral artery disease) ([place]   a. Angiography in March of 2012 showed 70-80% proximal right SFA stenosis status post self-expanding stent placement (6X60 mm). Angiography in April 2012 showed proximal left SFA occlusion status post self-expanding stent placement (7 X unkonw length). Both done by Dr. DeLucky Cowboy . Peripheral vascular disease ([place]  . Sleep apnea   . Syncope and collapse   . Tobacco use    a. 75+ yr hx - still smoking 1ppd, down from 2 ppd.  . Varicose vein      Past Surgical History:  Procedure Laterality Date  . ABDOMINAL AORTAGRAM N/A 06/23/2013   Procedure: ABDOMINAL AOMaxcine Ham Surgeon: MuWellington HampshireMD;  [facility];  Service: Cardiovascular;  Laterality: N/A;  . APPENDECTOMY    . CARDIAC CATHETERIZATION  10/14   [facility]; X1 STENT PROXIMAL RCA  . CARDIAC CATHETERIZATION  02/2010   [facility]  . CARDIAC CAT[facility]IZATION  [facility]/2014   [facility]  . CLAVICLE SURGERY    . HERNIA REPAIR    . PERIPHERAL ARTERIAL STENT GRAFT     x2 left/right  . PERIPHERAL VASCULAR CATHETERIZATION N/A 07/06/2015   Procedure: Abdominal Aortogram w/Lower Extremity;  Surgeon: JaAlgernon HuxleyMD;  [place];  Service: Cardiovascular;  Laterality: N/A;  . PERIPHERAL VASCULAR CATHETERIZATION  07/06/2015   Procedure: Lower Extremity Intervention;  Surgeon: JaAlgernon HuxleyMD;  [place] [facility];  Service: Cardiovascular;;    Family History  Problem Relation Age of Onset  . Heart attack Father   . Heart disease Father   . Hypertension Father   . Hyperlipidemia Father   . Hypertension Mother   . Hyperlipidemia Mother     Social History:  reports that he has been smoking Cigarettes.  He has a 41.00 pack-year smoking history. He does not have any smokeless tobacco  history on file. He reports that he does not drink alcohol or use drugs.  Allergies: No Known Allergies  Medications:  Scheduled: . aspirin EC  81 mg Oral Daily  . clonazePAM  1 mg Oral QHS  . clopidogrel  75 mg Oral Daily  . collagenase   Topical Daily  . enoxaparin (LOVENOX) injection  40 mg Subcutaneous Q12H  . gabapentin  800 mg Oral TID  . glipiZIDE  10 mg Oral QAC breakfast  . insulin aspart  0-15 Units Subcutaneous TID WC  . insulin aspart  0-5 Units Subcutaneous QHS  . insulin glargine  45 Units Subcutaneous BID  . piperacillin-tazobactam (ZOSYN)  IV  3.375 g Intravenous Q8H  . vancomycin  1,250 mg Intravenous Q8H    Results for orders placed or performed  during the hospital encounter of 05/05/16 (from the past 48 hour(s))  CBC     Status: None   Collection Time: 05/05/16  8:07 PM  Result Value Ref Range   WBC 9.9 3.8 - 10.6 K/uL   RBC 5.31 4.40 - 5.90 MIL/uL   Hemoglobin 15.7 13.0 - 18.0 g/dL   HCT 45.5 40.0 - 52.0 %   MCV 85.7 80.0 - 100.0 fL   MCH 29.5 26.0 - 34.0 pg   MCHC 34.4 32.0 - 36.0 g/dL   RDW 14.3 11.5 - 14.5 %   Platelets 273 150 - 440 K/uL  Comprehensive metabolic panel     Status: Abnormal   Collection Time: 05/05/16  8:07 PM  Result Value Ref Range   Sodium 131 (L) 135 - 145 mmol/L   Potassium 3.9 3.5 - 5.1 mmol/L   Chloride 95 (L) 101 - 111 mmol/L   CO2 28 22 - 32 mmol/L   Glucose, Bld 385 (H) 65 - 99 mg/dL   BUN 10 6 - 20 mg/dL   Creatinine, Ser 0.90 0.61 - 1.24 mg/dL   Calcium 9.5 8.9 - 10.3 mg/dL   Total Protein 7.4 6.5 - 8.1 g/dL   Albumin 3.7 3.5 - 5.0 g/dL   AST 21 15 - 41 U/L   ALT 13 (L) 17 - 63 U/L   Alkaline Phosphatase 88 38 - 126 U/L   Total Bilirubin 0.2 (L) 0.3 - 1.2 mg/dL   GFR calc non Af Amer >60 >60 mL/min   GFR calc Af Amer >60 >60 mL/min    Comment: (NOTE) The eGFR has been calculated using the CKD EPI equation. This calculation has not been validated in all clinical situations. eGFR's persistently <60 mL/min signify possible Chronic Kidney Disease.    Anion gap 8 5 - 15  Blood culture (routine x 2)     Status: None (Preliminary result)   Collection Time: 05/05/16 10:08 PM  Result Value Ref Range   Specimen Description BLOOD RIGHT WRIST    Special Requests      BOTTLES DRAWN AEROBIC AND ANAEROBIC 17CCAERO,[id number]   Culture NO GROWTH < 12 HOURS    Report Status PENDING   Blood culture (routine x 2)     Status: None (Preliminary result)   Collection Time: 05/05/16 10:08 PM  Result Value Ref Range   Specimen Description BLOOD LEFT HAND    Special Requests BOTTLES DRAWN AEROBIC AND ANAEROBIC 9CCAERO,7CCANA    Culture NO GROWTH < 12 HOURS    Report Status PENDING   Glucose,  capillary     Status: Abnormal   Collection Time: 05/06/16 12:35 AM  Result Value Ref Range   Glucose-Capillary 322 (H)  65 - 99 mg/dL   Comment 1 Notify RN   Basic metabolic panel     Status: Abnormal   Collection Time: 05/06/16  5:18 AM  Result Value Ref Range   Sodium 136 135 - 145 mmol/L   Potassium 4.0 3.5 - 5.1 mmol/L   Chloride 99 (L) 101 - 111 mmol/L   CO2 30 22 - 32 mmol/L   Glucose, Bld 305 (H) 65 - 99 mg/dL   BUN 11 6 - 20 mg/dL   Creatinine, Ser 0.96 0.61 - 1.24 mg/dL   Calcium 9.2 8.9 - 10.3 mg/dL   GFR calc non Af Amer >60 >60 mL/min   GFR calc Af Amer >60 >60 mL/min    Comment: (NOTE) The eGFR has been calculated using the CKD EPI equation. This calculation has not been validated in all clinical situations. eGFR's persistently <60 mL/min signify possible Chronic Kidney Disease.    Anion gap 7 5 - 15  CBC     Status: None   Collection Time: 05/06/16  5:18 AM  Result Value Ref Range   WBC 8.2 3.8 - 10.6 K/uL   RBC 5.06 4.40 - 5.90 MIL/uL   Hemoglobin 15.0 13.0 - 18.0 g/dL   HCT 43.7 40.0 - 52.0 %   MCV 86.4 80.0 - 100.0 fL   MCH 29.6 26.0 - 34.0 pg   MCHC 34.3 32.0 - 36.0 g/dL   RDW 14.4 11.5 - 14.5 %   Platelets 242 150 - 440 K/uL  Glucose, capillary     Status: Abnormal   Collection Time: 05/06/16  8:10 AM  Result Value Ref Range   Glucose-Capillary 287 (H) 65 - 99 mg/dL  Glucose, capillary     Status: Abnormal   Collection Time: 05/06/16 11:44 AM  Result Value Ref Range   Glucose-Capillary 251 (H) 65 - 99 mg/dL    Mr Foot Right Wo Contrast  Result Date: 05/06/2016 CLINICAL DATA:  Nonhealing diabetic foot ulcer for 4 months. EXAM: MRI OF THE RIGHT FOREFOOT WITHOUT CONTRAST TECHNIQUE: Multiplanar, multisequence MR imaging of the right foot was performed. No intravenous contrast was administered. COMPARISON:  Radiographs 05/05/2016 FINDINGS: Focal superficial fluid collection noted on the plantar aspect of the foot, most consistent with a blister. Mild  diffuse subcutaneous soft tissue swelling/ edema/ fluid suggesting cellulitis. This is mainly on the dorsum of the foot. There is also diffuse myofasciitis mainly involving the short flexor muscles. No findings to suggest pyomyositis. Advanced degenerative changes involving the midfoot. There appear to be punched out erosions suspicious for gout or other erosive arthropathy. This does not have the appearance of osteomyelitis or septic arthritis. The tibiotalar and subtalar joints are maintained. The major ligaments and tendons appear intact. Marked thickening of the plantar fascia could be due to chronic plantar fasciitis. IMPRESSION: 1. Cellulitis and myofasciitis without findings for focal soft tissue abscess or pyomyositis. A large skin blister is noted on the plantar aspect of the midfoot. 2. Suspect erosive arthropathy involving the midfoot, possibly gout. No definite MR findings to suggest septic arthritis or osteomyelitis. Electronically Signed   By: Marijo Sanes M.D.   On: 05/06/2016 09:34   US Venous Img Lower Bilateral  Result Date: 05/06/2016 CLINICAL DATA:  Nonhealing ulcer RIGHT foot. Lower extremity swelling for several months EXAM: BILATERAL LOWER EXTREMITY VENOUS DOPPLER ULTRASOUND TECHNIQUE: Gray-scale sonography with graded compression, as well as color Doppler and duplex ultrasound were performed to evaluate the lower extremity deep venous systems from the level of the  common femoral vein and including the common femoral, femoral, profunda femoral, popliteal and calf veins including the posterior tibial, peroneal and gastrocnemius veins when visible. The superficial great saphenous vein was also interrogated. Spectral Doppler was utilized to evaluate flow at rest and with distal augmentation maneuvers in the common femoral, femoral and popliteal veins. COMPARISON:  Plain films lower extremity and MRI RIGHT foot, 05/06/2016 FINDINGS: RIGHT LOWER EXTREMITY Common Femoral Vein: No evidence of  thrombus. Normal compressibility, respiratory phasicity and response to augmentation. Saphenofemoral Junction: No evidence of thrombus. Normal compressibility and flow on color Doppler imaging. Profunda Femoral Vein: No evidence of thrombus. Normal compressibility and flow on color Doppler imaging. Femoral Vein: No evidence of thrombus. Normal compressibility, respiratory phasicity and response to augmentation. Popliteal Vein: No evidence of thrombus. Normal compressibility, respiratory phasicity and response to augmentation. Calf Veins: No evidence of thrombus. Normal compressibility and flow on color Doppler imaging. Superficial Great Saphenous Vein: No evidence of thrombus. Normal compressibility and flow on color Doppler imaging. LEFT LOWER EXTREMITY Common Femoral Vein: No evidence of thrombus. Normal compressibility, respiratory phasicity and response to augmentation. Saphenofemoral Junction: No evidence of thrombus. Normal compressibility and flow on color Doppler imaging. Profunda Femoral Vein: No evidence of thrombus. Normal compressibility and flow on color Doppler imaging. Femoral Vein: No evidence of thrombus. Normal compressibility, respiratory phasicity and response to augmentation. Popliteal Vein: No evidence of thrombus. Normal compressibility, respiratory phasicity and response to augmentation. Calf Veins: No evidence of thrombus. Normal compressibility and flow on color Doppler imaging. Superficial Great Saphenous Vein: No evidence of thrombus. Normal compressibility and flow on color Doppler imaging. IMPRESSION: No evidence of deep venous thrombosis in LEFT or RIGHT lower extremity. Electronically Signed   By: Suzy Bouchard M.D.   On: 05/06/2016 11:24   Dg Foot Complete Right  Result Date: 05/05/2016 CLINICAL DATA:  Patient is being treated for diabetic ulcer of the bottom of the right foot. Increasing pain with over noticed from the ulcer. Draining pus. EXAM: RIGHT FOOT COMPLETE - 3+ VIEW  COMPARISON:  11/21/2015 FINDINGS: Large soft tissue ulceration along the plantar surface of the foot at the level of the tarsometatarsal joints. There is underlying soft tissue swelling. No radiopaque foreign body or soft tissue gas identified. There is suggestion of lucency and sclerosis at the base of the third metatarsal bone. This is nonspecific and there is limited visualization of this aerated bone overlap but this could be an area of osteomyelitis. Otherwise, no bone erosion or cortical changes are identified. Degenerative changes in the intertarsal joints. Small calcaneal spurs. No acute fracture or dislocation. IMPRESSION: Soft tissue ulceration to the plantar surface of the right foot at the level of the tarsometatarsal joints. Possible lucency and sclerosis at the base of third metatarsal bone which could indicate focal osteomyelitis. Visualization is limited due to bone overlap. Electronically Signed   By: Lucienne Capers M.D.   On: 05/05/2016 22:10    Review of Systems  Constitutional: Negative for chills and fever.  HENT: Negative.   Eyes: Negative.   Respiratory: Negative.   Cardiovascular: Negative.   Gastrointestinal: Negative for nausea and vomiting.  Genitourinary: Negative.   Musculoskeletal: Negative.   Skin:       Patient relates a chronic draining ulceration on the bottom of his right foot for months.  Neurological:       Patient does relate some numbness in the feet and legs related to his diabetes  Endo/Heme/Allergies: Negative.   Psychiatric/Behavioral: Negative.  Blood pressure 135/79, pulse (!) 104, temperature 97.8 F (36.6 C), temperature source Oral, resp. rate 18, height _0  (1.676 m), weight 115.2 kg (253 lb 14.4 oz), SpO2 91 %. Physical Exam  Cardiovascular:  DP pulses palpable 2 over 4 bilateral. PT pulses 1 over 4 bilateral.  Musculoskeletal:  Adequate range of motion of the pedal joints. Muscle testing is deferred.  Neurological:  Loss of  protective threshold with a monofilament wire in the toes and forefoot bilateral. Proprioception is impaired.  Skin:  The skin is warm dry and supple. Some diffuse edema in the right lower extremity as compared to the left. Mildly diminished but adequate hair growth. A large full-thickness ulceration is noted on the plantar aspect of the right arch approximately 2 cm in diameter. Does not probe to the deep space beneath the deeper subcutaneous tissue. A more superficial ulceration is present just adjacent to the other ulceration with an overlying abscess formation. A moderate amount of purulent discharge is expressed. Upon debridement there is no deep extension through the superficial tissues. Only mild cellulitis in the arch. No advancing cellulitis past the arch area.    Assessment/Plan: Assessment: 1. Full-thickness ulceration right midfoot with new abscess and superficial ulceration. 2. Diabetes with associated neuropathy. Plan: Excisional debridement of devitalized tissue from the superficial and full thickness ulcerative area on the right arch using a pair of tissue nippers. A culture was taken for identification and sensitivities of the purulence and abscess. A wet to dry dressing was applied. Recommend daily wound care. Patient states he is familiar with this at home. Encouraged him to be completely weightbearing off of the right foot using crutches or at least in the boot with only pressure on the heel. Most likely will need some oral antibiotics. From a foot standpoint the patient should be able to be managed outpatient with local wound care and follow-up. Discussed with the patient follow-up regarding his insurance and at this point recommend that he follow up down at [facility] where he has previously been seen and managed. Follow-up outpatient local if needed.  Durward Fortes 05/06/2016, 1:21 PM

## 2016-05-06 NOTE — Progress Notes (Signed)
Inpatient Diabetes Program Recommendations  AACE/ADA: New Consensus Statement on Inpatient Glycemic Control (2015)  Target Ranges:  Prepandial:   less than 140 mg/dL      Peak postprandial:   less than 180 mg/dL (1-2 hours)      Critically ill patients:  140 - 180 mg/dL   Results for NASHUA, HOMEWOOD (MRN [id number]) as of 05/06/2016 10:13  Ref. Range 05/06/2016 00:35 05/06/2016 08:10  Glucose-Capillary Latest Ref Range: 65 - 99 mg/dL 322 (H) 287 (H)    Admit with: Foot Ulcer  History: DM2  Home DM Meds: Lantus 80 units daily       Metformin 1000 mg BID       Glipizide 10 mg daily  Current Insulin Orders: Lantus 45 units BID      Novolog Moderate Correction Scale/ SSI (0-15 units) TID AC + HS      Glipizide 10 mg daily       -Note Lantus and Novolog and Glipizide started this AM.  -Patient getting MRI this AM as well.  -Agree with current Insulin orders.       MD- Please consider ordering Hemoglobin A1c level to assess current glucose control at home      --Will follow patient during hospitalization--  Wyn Quaker RN, MSN, CDE Diabetes Coordinator Inpatient Glycemic Control Team Team Pager: [phone] (8a-5p)

## 2016-05-06 NOTE — Progress Notes (Signed)
[place] rounding the unit visited Pt. Pt was on phone at the the time of this visit. CH told the Pt, he will comeback to see him later.     05/06/16 1400  Clinical Encounter Type  Visited With Patient  Visit Type Initial  Spiritual Encounters  Spiritual Needs Prayer;Other (Comment)

## 2016-05-06 NOTE — Progress Notes (Signed)
Pharmacy Antibiotic Note  [name] [name] is a 55 y.o. male admitted on 05/05/2016 with wound infection.  Pharmacy has been consulted for vancomycin and Zosyn dosing.  Plan: DW 84kg  Vd 59L kei 0.098 hr-1  T1/2 7 hours Vancomycin 1250 mg q 8 hours ordered. Level before 5th overall dose. Goal trough 15-20.  Zosyn 3.375 grams q 8 hours ordered.  Height: 5\' 6"  (167.6 cm) Weight: 253 lb 14.4 oz (115.2 kg) IBW/kg (Calculated) : 63.8  Temp (24hrs), Avg:98.4 F (36.9 C), Min:98 F (36.7 C), Max:98.8 F (37.1 C)   Recent Labs Lab 05/05/16 2007  WBC 9.9  CREATININE 0.90    Estimated Creatinine Clearance: 113.3 mL/min (by C-G formula based on SCr of 0.9 mg/dL).    No Known Allergies  Antimicrobials this admission: vancomycin 11/26 >>  Zosyn  11/26 >>   Dose adjustments this admission:   Microbiology results: 11/26 BCx: pending    Thank you for allowing pharmacy to be a part of this patient's care.  Jarian Longoria S 05/06/2016 2:03 AM

## 2016-05-06 NOTE — Progress Notes (Signed)
[place] tried to visit on RT Round. PT was not in the room. Will try again later.

## 2016-05-07 LAB — GLUCOSE, CAPILLARY: Glucose-Capillary: 209 mg/dL — ABNORMAL HIGH (ref 65–99)

## 2016-05-07 LAB — VANCOMYCIN, TROUGH: Vancomycin Tr: 19 ug/mL (ref 15–20)

## 2016-05-07 MED ORDER — INSULIN GLARGINE 100 UNITS/ML SOLOSTAR PEN: 80.0000 [IU] | PEN_INJECTOR | Freq: Every day | SUBCUTANEOUS | 0 refills | Status: DC

## 2016-05-07 MED ORDER — AMOXICILLIN-POT CLAVULANATE 875-125 MG PO TABS: 1.0000 | ORAL_TABLET | Freq: Two times a day (BID) | ORAL | 0 refills | Status: DC

## 2016-05-07 MED ORDER — COLLAGENASE 250 UNIT/GM EX OINT: TOPICAL_OINTMENT | Freq: Every day | CUTANEOUS | 0 refills | Status: DC

## 2016-05-07 NOTE — Discharge Summary (Signed)
[facility] NAME: [name] [name]    MR#:  [id number]  DATE OF BIRTH:  [birth year]  DATE OF ADMISSION:  05/05/2016 ADMITTING PHYSICIAN: Harvie Bridge, DO  DATE OF DISCHARGE: 05/07/2016  PRIMARY CARE PHYSICIAN: Clarisse Gouge, MD    ADMISSION DIAGNOSIS:  Wound infection [T14.8XXA, L08.9]  DISCHARGE DIAGNOSIS:  Active Problems:   Diabetic foot infection ([place])   Pressure injury of skin   SECONDARY DIAGNOSIS:   Past Medical History:  Diagnosis Date  . Arthritis   . Asthma   . Atherosclerosis   . BPH (benign prostatic hyperplasia)   . Chronic diastolic CHF (congestive heart failure) ([place])    a. 02/2013 EF 50% by LV gram.  . COPD (chronic obstructive pulmonary disease) ([place])   . Coronary artery disease    a. Status post LAD PCI in 2013 at [facility];  b. October 2014 - Nuclear stress test showed inferior wall defect->Cath: patent LAD stent with 90% stenosis in the proximal RCA ( 2.5 x 23 mm DES), EF 50%;  c. 02/2014 MV w/ large inferior scar->Cath: LAD 62m, LCX 31m, 20d, RCA 20p/m, patent stent->Med Rx.  . Diabetes mellitus without complication ([place])   . Hernia   . Hyperlipidemia   . Hypertension   . Morbid obesity ([place])   . Neuropathy ([employer])   . PAD (peripheral artery disease) ([place])    a. Angiography in March of 2012 showed 70-80% proximal right SFA stenosis status post self-expanding stent placement (6X60 mm). Angiography in April 2012 showed proximal left SFA occlusion status post self-expanding stent placement (7 X unkonw length). Both done by Dr. Lucky Cowboy.  . Peripheral vascular disease ([place])   . Sleep apnea   . Syncope and collapse   . Tobacco use    a. 75+ yr hx - still smoking 1ppd, down from 2 ppd.  . Varicose vein     HOSPITAL COURSE:   55 y.o.malewith a history of CAD, CHF, DM, HTN, HLD, PAD, chronic diabetic foot ulcerAdmitted with diabetic foot infection and possible osteomyelitis   1. Diabetic foot  infection: MRI shows Cellulitis and myofasciitis without findings for focal soft tissue abscess or pyomyositis  Excisional debridement of devitalized tissue from the superficial and full thickness ulcerative area on the right arch using a pair of tissue nippers. A culture was taken for identification and sensitivities of the purulence and abscess. Patient should be completely weightbearing off of the right foot using crutches or at least in the boot with only pressure on the heel.  From a foot standpoint the patient should be able to be managed outpatient with local wound care and follow-up.  He was discharged with Augmentin and will follow up with podiatry in 1 week.   2. Right calf tenderness: Is due to edema from the infection and not DVT as reported by lower external he Dopplers.  3. Hyponatremia: Improved IV fluids   4. Diabetes with bilateral neuropathy: Continue ADA diet and  glipizide and Lantus 5. History of coronary artery disease: Continue aspirin, Plavix and nitroglycerin 6. History of peripheral vascular disease-continue Plavix 7. Neuropathy due to diabetes: Continue gabapentin 8. TObacco Dependence: Encouraged to stop smoking  DISCHARGE CONDITIONS AND DIET:   Stable for discharge on diabetic diet  CONSULTS OBTAINED:    DRUG ALLERGIES:  No Known Allergies  DISCHARGE MEDICATIONS:   Current Discharge Medication List    START taking these medications   Details  amoxicillin-clavulanate (AUGMENTIN) 875-125 MG tablet Take 1  tablet by mouth 2 (two) times daily. Qty: 20 tablet, Refills: 0    collagenase (SANTYL) ointment Apply topically daily. Qty: 15 g, Refills: 0      CONTINUE these medications which have CHANGED   Details  insulin glargine (LANTUS) 100 unit/mL SOPN Inject 0.8 mLs (80 Units total) into the skin daily. Qty: 24 mL, Refills: 0      CONTINUE these medications which have NOT CHANGED   Details  albuterol (PROVENTIL HFA;VENTOLIN HFA) 108 (90 BASE)  MCG/ACT inhaler Inhale 2 puffs into the lungs every 6 (six) hours as needed for wheezing or shortness of breath. Qty: 1 Inhaler, Refills: 2    aspirin EC 81 MG tablet Take 1 tablet (81 mg total) by mouth daily. Qty: 90 tablet, Refills: 3    clonazePAM (KLONOPIN) 1 MG tablet Take 1 mg by mouth at bedtime.    clopidogrel (PLAVIX) 75 MG tablet Take 1 tablet (75 mg total) by mouth daily. Qty: 30 tablet, Refills: 11    gabapentin (NEURONTIN) 800 MG tablet Take 800 mg by mouth 3 (three) times daily.    glipiZIDE (GLUCOTROL) 10 MG tablet Take 10 mg by mouth daily before breakfast.    metFORMIN (GLUCOPHAGE) 1000 MG tablet Take 1,000 mg by mouth 2 (two) times daily with a meal.    nitroGLYCERIN (NITRODUR - DOSED IN MG/24 HR) 0.4 mg/hr patch Place 0.4 mg onto the skin daily as needed (for chest pain).     nitroGLYCERIN (NITROSTAT) 0.4 MG SL tablet Place 0.4 mg under the tongue every 5 (five) minutes as needed for chest pain.    oxyCODONE-acetaminophen (ROXICET) 5-325 MG tablet Take 1 tablet by mouth every 4 (four) hours as needed for severe pain. Qty: 15 tablet, Refills: 0              Today   CHIEF COMPLAINT:  Patient ready for discharge without any acute events overnight   VITAL SIGNS:  Blood pressure 138/83, pulse 96, temperature 98.3 F (36.8 C), temperature source Oral, resp. rate 20, height 5\' 6"  (1.676 m), weight 115.2 kg (253 lb 14.4 oz), SpO2 93 %.   REVIEW OF SYSTEMS:  Review of Systems  Constitutional: Negative.  Negative for chills, fever and malaise/fatigue.  HENT: Negative.  Negative for ear discharge, ear pain, hearing loss, nosebleeds and sore throat.   Eyes: Negative.  Negative for blurred vision and pain.  Respiratory: Negative.  Negative for cough, hemoptysis, shortness of breath and wheezing.   Cardiovascular: Negative.  Negative for chest pain, palpitations and leg swelling.  Gastrointestinal: Negative.  Negative for abdominal pain, blood in stool,  diarrhea, nausea and vomiting.  Genitourinary: Negative.  Negative for dysuria.  Musculoskeletal: Negative.  Negative for back pain.  Skin:       Ulcer bottom of foot right  Neurological: Negative for dizziness, tremors, speech change, focal weakness, seizures and headaches.  Endo/Heme/Allergies: Negative.  Does not bruise/bleed easily.  Psychiatric/Behavioral: Negative.  Negative for depression, hallucinations and suicidal ideas.     PHYSICAL EXAMINATION:  GENERAL:  55 y.o.-year-old patient lying in the bed with no acute distress.  NECK:  Supple, no jugular venous distention. No thyroid enlargement, no tenderness.  LUNGS: Normal breath sounds bilaterally, no wheezing, rales,rhonchi  No use of accessory muscles of respiration.  CARDIOVASCULAR: S1, S2 normal. No murmurs, rubs, or gallops.  ABDOMEN: Soft, non-tender, non-distended. Bowel sounds present. No organomegaly or mass.  EXTREMITIES: No pedal edema, cyanosis, or clubbing.  PSYCHIATRIC: The patient is alert and oriented  x 3.  SKIN: A large full-thickness ulceration is noted on the plantar aspect of the right arch approximately 2 cm in diameter. Does not probe to bone  DATA REVIEW:   CBC  Recent Labs Lab 05/06/16 0518  WBC 8.2  HGB 15.0  HCT 43.7  PLT 242    Chemistries   Recent Labs Lab 05/05/16 2007 05/06/16 0518  NA 131* 136  K 3.9 4.0  CL 95* 99*  CO2 28 30  GLUCOSE 385* 305*  BUN 10 11  CREATININE 0.90 0.96  CALCIUM 9.5 9.2  AST 21  --   ALT 13*  --   ALKPHOS 88  --   BILITOT 0.2*  --     Cardiac Enzymes No results for input(s): TROPONINI in the last 168 hours.  Microbiology Results  @[email or web address] @  RADIOLOGY:  Mr Foot Right Wo Contrast  Result Date: 05/06/2016 CLINICAL DATA:  Nonhealing diabetic foot ulcer for 4 months. EXAM: MRI OF THE RIGHT FOREFOOT WITHOUT CONTRAST TECHNIQUE: Multiplanar, multisequence MR imaging of the right foot was performed. No intravenous contrast was administered.  COMPARISON:  Radiographs 05/05/2016 FINDINGS: Focal superficial fluid collection noted on the plantar aspect of the foot, most consistent with a blister. Mild diffuse subcutaneous soft tissue swelling/ edema/ fluid suggesting cellulitis. This is mainly on the dorsum of the foot. There is also diffuse myofasciitis mainly involving the short flexor muscles. No findings to suggest pyomyositis. Advanced degenerative changes involving the midfoot. There appear to be punched out erosions suspicious for gout or other erosive arthropathy. This does not have the appearance of osteomyelitis or septic arthritis. The tibiotalar and subtalar joints are maintained. The major ligaments and tendons appear intact. Marked thickening of the plantar fascia could be due to chronic plantar fasciitis. IMPRESSION: 1. Cellulitis and myofasciitis without findings for focal soft tissue abscess or pyomyositis. A large skin blister is noted on the plantar aspect of the midfoot. 2. Suspect erosive arthropathy involving the midfoot, possibly gout. No definite MR findings to suggest septic arthritis or osteomyelitis. Electronically Signed   By: Marijo Sanes M.D.   On: 05/06/2016 09:34   US Venous Img Lower Bilateral  Result Date: 05/06/2016 CLINICAL DATA:  Nonhealing ulcer RIGHT foot. Lower extremity swelling for several months EXAM: BILATERAL LOWER EXTREMITY VENOUS DOPPLER ULTRASOUND TECHNIQUE: Gray-scale sonography with graded compression, as well as color Doppler and duplex ultrasound were performed to evaluate the lower extremity deep venous systems from the level of the common femoral vein and including the common femoral, femoral, profunda femoral, popliteal and calf veins including the posterior tibial, peroneal and gastrocnemius veins when visible. The superficial great saphenous vein was also interrogated. Spectral Doppler was utilized to evaluate flow at rest and with distal augmentation maneuvers in the common femoral, femoral and  popliteal veins. COMPARISON:  Plain films lower extremity and MRI RIGHT foot, 05/06/2016 FINDINGS: RIGHT LOWER EXTREMITY Common Femoral Vein: No evidence of thrombus. Normal compressibility, respiratory phasicity and response to augmentation. Saphenofemoral Junction: No evidence of thrombus. Normal compressibility and flow on color Doppler imaging. Profunda Femoral Vein: No evidence of thrombus. Normal compressibility and flow on color Doppler imaging. Femoral Vein: No evidence of thrombus. Normal compressibility, respiratory phasicity and response to augmentation. Popliteal Vein: No evidence of thrombus. Normal compressibility, respiratory phasicity and response to augmentation. Calf Veins: No evidence of thrombus. Normal compressibility and flow on color Doppler imaging. Superficial Great Saphenous Vein: No evidence of thrombus. Normal compressibility and flow on color Doppler imaging. LEFT LOWER EXTREMITY Common  Femoral Vein: No evidence of thrombus. Normal compressibility, respiratory phasicity and response to augmentation. Saphenofemoral Junction: No evidence of thrombus. Normal compressibility and flow on color Doppler imaging. Profunda Femoral Vein: No evidence of thrombus. Normal compressibility and flow on color Doppler imaging. Femoral Vein: No evidence of thrombus. Normal compressibility, respiratory phasicity and response to augmentation. Popliteal Vein: No evidence of thrombus. Normal compressibility, respiratory phasicity and response to augmentation. Calf Veins: No evidence of thrombus. Normal compressibility and flow on color Doppler imaging. Superficial Great Saphenous Vein: No evidence of thrombus. Normal compressibility and flow on color Doppler imaging. IMPRESSION: No evidence of deep venous thrombosis in LEFT or RIGHT lower extremity. Electronically Signed   By: Suzy Bouchard M.D.   On: 05/06/2016 11:24   Dg Foot Complete Right  Result Date: 05/05/2016 CLINICAL DATA:  Patient is being  treated for diabetic ulcer of the bottom of the right foot. Increasing pain with over noticed from the ulcer. Draining pus. EXAM: RIGHT FOOT COMPLETE - 3+ VIEW COMPARISON:  11/21/2015 FINDINGS: Large soft tissue ulceration along the plantar surface of the foot at the level of the tarsometatarsal joints. There is underlying soft tissue swelling. No radiopaque foreign body or soft tissue gas identified. There is suggestion of lucency and sclerosis at the base of the third metatarsal bone. This is nonspecific and there is limited visualization of this aerated bone overlap but this could be an area of osteomyelitis. Otherwise, no bone erosion or cortical changes are identified. Degenerative changes in the intertarsal joints. Small calcaneal spurs. No acute fracture or dislocation. IMPRESSION: Soft tissue ulceration to the plantar surface of the right foot at the level of the tarsometatarsal joints. Possible lucency and sclerosis at the base of third metatarsal bone which could indicate focal osteomyelitis. Visualization is limited due to bone overlap. Electronically Signed   By: Lucienne Capers M.D.   On: 05/05/2016 22:10      Management plans discussed with the patient and he is in agreement. Stable for discharge home  Patient should follow up with pcp  CODE STATUS:     Code Status Orders        Start     Ordered   05/06/16 0005  Full code  Continuous     05/06/16 0004    Code Status History    Date Active Date Inactive Code Status Order ID Comments User Context   11/21/2015  5:16 PM 11/22/2015  7:50 PM Full Code [id number]  Gladstone Lighter, MD Inpatient   07/06/2015  9:48 AM 07/06/2015  2:37 PM Full Code [id number]  Algernon Huxley, MD Inpatient      TOTAL TIME TAKING CARE OF THIS PATIENT: 34 minutes.    Note: This dictation was prepared with Dragon dictation along with smaller phrase technology. Any transcriptional errors that result from this process are unintentional.  Kortney Potvin M.D on  05/07/2016 at 11:00 AM  Between 7am to 6pm - Pager - [phone] After 6pm go to [email or web address] - password [email or web address] [facility]  Office  [phone]  CC: Primary care physician; Clarisse Gouge, MD

## 2016-05-07 NOTE — Progress Notes (Addendum)
Wound care provided and dressing changed to right foot. Santyl applied. Pt alert and oriented on my shift, VS stable, resting.

## 2016-05-07 NOTE — Progress Notes (Signed)
Pharmacy Antibiotic Note  [name] [name] is a 55 y.o. male admitted on 05/05/2016 with wound infection.  Pharmacy has been consulted for vancomycin and Zosyn dosing.  Plan: DW 84kg  Vd 59L kei 0.098 hr-1  T1/2 7 hours Vancomycin 1250 mg q 8 hours ordered. Level before 5th overall dose. Goal trough 15-20.  Zosyn 3.375 grams q 8 hours ordered.  Height: 5\' 6"  (167.6 cm) Weight: 253 lb 14.4 oz (115.2 kg) IBW/kg (Calculated) : 63.8  Temp (24hrs), Avg:98.1 F (36.7 C), Min:97.8 F (36.6 C), Max:98.2 F (36.8 C)   Recent Labs Lab 05/05/16 2007 05/06/16 0518 05/07/16 0433  WBC 9.9 8.2  --   CREATININE 0.90 0.96  --   VANCOTROUGH  --   --  19    Estimated Creatinine Clearance: 106.2 mL/min (by C-G formula based on SCr of 0.96 mg/dL).    No Known Allergies  Antimicrobials this admission: vancomycin 11/26 >>  Zosyn  11/26 >>   Dose adjustments this admission:   Microbiology results: 11/26 BCx: pending   11/28 AM vanc level 19. Continue current regimen. Check Scr with tomorrow AM labs.   Thank you for allowing pharmacy to be a part of this patient's care.  [name] [name] S 05/07/2016 6:27 AM

## 2016-05-07 NOTE — Progress Notes (Signed)
Pt being discharged home today. PIV removed. Discharge instructions reviewed with pt, all questions answered. Prescriptions given to pt to have filled. Due to no insurance patient will [facility]. Instructions on dressing changes in instructions and pt was able to return demonstrate as he has done them before in the past. He is leaving with all his belongings, will be transported home via family.

## 2016-05-10 LAB — CULTURE, BLOOD (ROUTINE X 2)
Culture: NO GROWTH
Culture: NO GROWTH

## 2016-05-11 LAB — AEROBIC/ANAEROBIC CULTURE W GRAM STAIN (SURGICAL/DEEP WOUND)

## 2016-05-11 LAB — AEROBIC/ANAEROBIC CULTURE (SURGICAL/DEEP WOUND)

## 2016-07-10 ENCOUNTER — Observation Stay
Admission: EM | Admit: 2016-07-10 | Discharge: 2016-07-11 | Disposition: A | Discharge status: Home / Self Care | Payer: BLUE CROSS/BLUE SHIELD | Attending: Internal Medicine | Admitting: Internal Medicine

## 2016-07-10 ENCOUNTER — Emergency Department: Payer: Self-pay

## 2016-07-10 ENCOUNTER — Encounter: Payer: Self-pay | Admitting: Emergency Medicine

## 2016-07-10 DIAGNOSIS — I2089 Other forms of angina pectoris: Secondary | ICD-10-CM

## 2016-07-10 DIAGNOSIS — L97519 Non-pressure chronic ulcer of other part of right foot with unspecified severity: Secondary | ICD-10-CM

## 2016-07-10 DIAGNOSIS — E871 Hypo-osmolality and hyponatremia: Secondary | ICD-10-CM | POA: Insufficient documentation

## 2016-07-10 DIAGNOSIS — I208 Other forms of angina pectoris: Secondary | ICD-10-CM

## 2016-07-10 DIAGNOSIS — I200 Unstable angina: Secondary | ICD-10-CM | POA: Diagnosis present

## 2016-07-10 DIAGNOSIS — R0902 Hypoxemia: Secondary | ICD-10-CM

## 2016-07-10 DIAGNOSIS — Z79899 Other long term (current) drug therapy: Secondary | ICD-10-CM | POA: Insufficient documentation

## 2016-07-10 DIAGNOSIS — R0602 Shortness of breath: Secondary | ICD-10-CM

## 2016-07-10 DIAGNOSIS — I700 Atherosclerosis of aorta: Secondary | ICD-10-CM | POA: Insufficient documentation

## 2016-07-10 DIAGNOSIS — R079 Chest pain, unspecified: Principal | ICD-10-CM | POA: Diagnosis present

## 2016-07-10 DIAGNOSIS — E11621 Type 2 diabetes mellitus with foot ulcer: Secondary | ICD-10-CM | POA: Insufficient documentation

## 2016-07-10 DIAGNOSIS — J439 Emphysema, unspecified: Secondary | ICD-10-CM | POA: Insufficient documentation

## 2016-07-10 DIAGNOSIS — Z7982 Long term (current) use of aspirin: Secondary | ICD-10-CM | POA: Insufficient documentation

## 2016-07-10 DIAGNOSIS — E1165 Type 2 diabetes mellitus with hyperglycemia: Secondary | ICD-10-CM | POA: Insufficient documentation

## 2016-07-10 DIAGNOSIS — I10 Essential (primary) hypertension: Secondary | ICD-10-CM | POA: Insufficient documentation

## 2016-07-10 DIAGNOSIS — Z955 Presence of coronary angioplasty implant and graft: Secondary | ICD-10-CM | POA: Insufficient documentation

## 2016-07-10 DIAGNOSIS — F17210 Nicotine dependence, cigarettes, uncomplicated: Secondary | ICD-10-CM | POA: Insufficient documentation

## 2016-07-10 DIAGNOSIS — Z794 Long term (current) use of insulin: Secondary | ICD-10-CM | POA: Insufficient documentation

## 2016-07-10 DIAGNOSIS — E1140 Type 2 diabetes mellitus with diabetic neuropathy, unspecified: Secondary | ICD-10-CM | POA: Insufficient documentation

## 2016-07-10 DIAGNOSIS — E1151 Type 2 diabetes mellitus with diabetic peripheral angiopathy without gangrene: Secondary | ICD-10-CM | POA: Insufficient documentation

## 2016-07-10 DIAGNOSIS — Z7902 Long term (current) use of antithrombotics/antiplatelets: Secondary | ICD-10-CM | POA: Insufficient documentation

## 2016-07-10 DIAGNOSIS — G4730 Sleep apnea, unspecified: Secondary | ICD-10-CM | POA: Insufficient documentation

## 2016-07-10 DIAGNOSIS — I25110 Atherosclerotic heart disease of native coronary artery with unstable angina pectoris: Secondary | ICD-10-CM | POA: Insufficient documentation

## 2016-07-10 LAB — TROPONIN I
Troponin I: 0.04 ng/mL (ref <0.03)
Troponin I: 0.05 ng/mL (ref <0.03)

## 2016-07-10 LAB — BASIC METABOLIC PANEL
Anion gap: 9 (ref 5–15)
BUN: 9 mg/dL (ref 6–20)
CO2: 28 mmol/L (ref 22–32)
Calcium: 9.2 mg/dL (ref 8.9–10.3)
Chloride: 94 mmol/L — ABNORMAL LOW (ref 101–111)
Creatinine, Ser: 1.02 mg/dL (ref 0.61–1.24)
GFR calc Af Amer: >60 mL/min (ref >60)
GFR calc non Af Amer: >60 mL/min (ref >60)
Glucose, Bld: 335 mg/dL — ABNORMAL HIGH (ref 65–99)
Potassium: 4.5 mmol/L (ref 3.5–5.1)
Sodium: 131 mmol/L — ABNORMAL LOW (ref 135–145)

## 2016-07-10 LAB — CBC
HCT: 49.2 % (ref 40.0–52.0)
Hemoglobin: 16.3 g/dL (ref 13.0–18.0)
MCH: 27.8 pg (ref 26.0–34.0)
MCHC: 33.2 g/dL (ref 32.0–36.0)
MCV: 83.7 fL (ref 80.0–100.0)
Platelets: 241 10*3/uL (ref 150–440)
RBC: 5.87 MIL/uL (ref 4.40–5.90)
RDW: 15.8 % — ABNORMAL HIGH (ref 11.5–14.5)
WBC: 8.1 10*3/uL (ref 3.8–10.6)

## 2016-07-10 LAB — INFLUENZA PANEL BY PCR (TYPE A & B)
Influenza A By PCR: NEGATIVE
Influenza B By PCR: NEGATIVE

## 2016-07-10 LAB — FIBRIN DERIVATIVES D-DIMER (ARMC ONLY): Fibrin derivatives D-dimer (ARMC): 781 — ABNORMAL HIGH (ref 0–499)

## 2016-07-10 MED ORDER — ASPIRIN 81 MG PO CHEW
324.0000 mg | CHEWABLE_TABLET | Freq: Once | ORAL | Status: AC
  Administered 2016-07-10: 324 mg via ORAL
  Filled 2016-07-10: qty 4

## 2016-07-10 MED ORDER — OXYCODONE-ACETAMINOPHEN 5-325 MG PO TABS
ORAL_TABLET | ORAL | Status: AC
  Administered 2016-07-10: 1
  Filled 2016-07-10: qty 1

## 2016-07-10 MED ORDER — SODIUM CHLORIDE 0.9 % IV BOLUS (SEPSIS)
1000.0000 mL | Freq: Once | INTRAVENOUS | Status: AC
  Administered 2016-07-10: 1000 mL via INTRAVENOUS

## 2016-07-10 MED ORDER — IOPAMIDOL (ISOVUE-370) INJECTION 76%
100.0000 mL | Freq: Once | INTRAVENOUS | Status: AC | PRN
  Administered 2016-07-10: 100 mL via INTRAVENOUS

## 2016-07-10 MED ORDER — ALBUTEROL SULFATE (2.5 MG/3ML) 0.083% IN NEBU
5.0000 mg | INHALATION_SOLUTION | Freq: Once | RESPIRATORY_TRACT | Status: AC
  Administered 2016-07-10: 5 mg via RESPIRATORY_TRACT
  Filled 2016-07-10: qty 6

## 2016-07-10 NOTE — ED Provider Notes (Signed)
[facility] Emergency Department Provider Note  ____________________________________________   I have reviewed the triage vital signs and the nursing notes.   HISTORY  Chief Complaint Chest Pain   History limited by: Not Limited   HPI [name] [name] is a 56 y.o. male who presents to the emergency department today with two main complaints. His first complaint is for chest pain. The patient states that it is located in the center chest. Sometimes sharp, sometimes pressure like. It has been happening on and off for the past three days. No specific activity or position will bring it on. The patient states that he has had shortness of breath for months.  His second complaint is for a foot ulcer to his right foot. This has been present for greater than 6 months. Patient states that he has not been able to follow up with podiatry recently due to losing his insurance.    Past Medical History:  Diagnosis Date  . Arthritis   . Asthma   . Atherosclerosis   . BPH (benign prostatic hyperplasia)   . Chronic diastolic CHF (congestive heart failure) ([place])    a. 02/2013 EF 50% by LV gram.  . COPD (chronic obstructive pulmonary disease) ([place])   . Coronary artery disease    a. Status post LAD PCI in 2013 at [facility];  b. October 2014 - Nuclear stress test showed inferior wall defect->Cath: patent LAD stent with 90% stenosis in the proximal RCA ( 2.5 x 23 mm DES), EF 50%;  c. 02/2014 MV w/ large inferior scar->Cath: LAD 52m, LCX 66m, 20d, RCA 20p/m, patent stent->Med Rx.  . Diabetes mellitus without complication ([place])   . Hernia   . Hyperlipidemia   . Hypertension   . Morbid obesity ([place])   . Neuropathy ([place])   . PAD (peripheral artery disease) ([place])    a. Angiography in March of 2012 showed 70-80% proximal right SFA stenosis status post self-expanding stent placement (6X60 mm). Angiography in April 2012 showed proximal left SFA occlusion status post self-expanding stent placement  (7 X unkonw length). Both done by Dr. Lucky Cowboy.  . Peripheral vascular disease ([place])   . Sleep apnea   . Syncope and collapse   . Tobacco use    a. 75+ yr hx - still smoking 1ppd, down from 2 ppd.  . Varicose vein     Patient Active Problem List   Diagnosis Date Noted  . Pressure injury of skin 05/06/2016  . Diabetic foot infection ([place]) 05/05/2016  . Cellulitis 11/21/2015  . Morbid obesity ([place]) 11/17/2014  . PAD (peripheral artery disease) ([place])   . Coronary artery disease   . Hyperlipidemia   . Hypertension   . Tobacco use     Past Surgical History:  Procedure Laterality Date  . ABDOMINAL AORTAGRAM N/A 06/23/2013   Procedure: ABDOMINAL Maxcine Ham;  Surgeon: Wellington Hampshire, MD;  Location: [place] CATH [facility];  Service: Cardiovascular;  Laterality: N/A;  . APPENDECTOMY    . CARDIAC CATHETERIZATION  10/14   [facility]; X1 STENT PROXIMAL RCA  . CARDIAC CATHETERIZATION  02/2010   [facility]  . CARDIAC CATHETERIZATION  02/21/2014   [facility]  . CLAVICLE SURGERY    . HERNIA REPAIR    . PERIPHERAL ARTERIAL STENT GRAFT     x2 left/right  . PERIPHERAL VASCULAR CATHETERIZATION N/A 07/06/2015   Procedure: Abdominal Aortogram w/Lower Extremity;  Surgeon: Algernon Huxley, MD;  Location: [place] [facility];  Service: Cardiovascular;  Laterality: N/A;  . PERIPHERAL  VASCULAR CATHETERIZATION  07/06/2015   Procedure: Lower Extremity Intervention;  Surgeon: Algernon Huxley, MD;  Location: [place] [facility];  Service: Cardiovascular;;    Prior to Admission medications   Medication Sig Start Date End Date Taking? Authorizing Provider  albuterol (PROVENTIL HFA;VENTOLIN HFA) 108 (90 BASE) MCG/ACT inhaler Inhale 2 puffs into the lungs every 6 (six) hours as needed for wheezing or shortness of breath. 04/15/13   Wellington Hampshire, MD  amoxicillin-clavulanate (AUGMENTIN) 875-125 MG tablet Take 1 tablet by mouth 2 (two) times daily. 05/07/16   Bettey Costa, MD  aspirin EC 81 MG tablet Take 1 tablet (81 mg total) by mouth daily.  05/11/15   Wellington Hampshire, MD  clonazePAM (KLONOPIN) 1 MG tablet Take 1 mg by mouth at bedtime.    Historical Provider, MD  clopidogrel (PLAVIX) 75 MG tablet Take 1 tablet (75 mg total) by mouth daily. 07/06/15   Algernon Huxley, MD  collagenase (SANTYL) ointment Apply topically daily. 05/08/16   Bettey Costa, MD  gabapentin (NEURONTIN) 800 MG tablet Take 800 mg by mouth 3 (three) times daily.    Historical Provider, MD  glipiZIDE (GLUCOTROL) 10 MG tablet Take 10 mg by mouth daily before breakfast.    Historical Provider, MD  insulin glargine (LANTUS) 100 unit/mL SOPN Inject 0.8 mLs (80 Units total) into the skin daily. 05/07/16 06/06/16  Bettey Costa, MD  metFORMIN (GLUCOPHAGE) 1000 MG tablet Take 1,000 mg by mouth 2 (two) times daily with a meal.    Historical Provider, MD  nitroGLYCERIN (NITRODUR - DOSED IN MG/24 HR) 0.4 mg/hr patch Place 0.4 mg onto the skin daily as needed (for chest pain).     Historical Provider, MD  nitroGLYCERIN (NITROSTAT) 0.4 MG SL tablet Place 0.4 mg under the tongue every 5 (five) minutes as needed for chest pain.    Historical Provider, MD  oxyCODONE-acetaminophen (ROXICET) 5-325 MG tablet Take 1 tablet by mouth every 4 (four) hours as needed for severe pain. 07/10/15   Paulette Blanch, MD    Allergies Patient has no known allergies.  Family History  Problem Relation Age of Onset  . Heart attack Father   . Heart disease Father   . Hypertension Father   . Hyperlipidemia Father   . Hypertension Mother   . Hyperlipidemia Mother     Social History Social History  Substance Use Topics  . Smoking status: Current Every Day Smoker    Packs/day: 0.50    Years: 41.00    Types: Cigarettes  . Smokeless tobacco: Never Used  . Alcohol use No    Review of Systems  Constitutional: Negative for fever. Cardiovascular: Positive for chest pain. Respiratory: Positive for shortness of breath. Gastrointestinal: Negative for abdominal pain, vomiting and diarrhea. Genitourinary:  Negative for dysuria. Musculoskeletal: Negative for back pain. Skin: Positive for ulcer to right foot. Neurological: Negative for headaches, focal weakness or numbness.  10-point ROS otherwise negative.  ____________________________________________   PHYSICAL EXAM:  VITAL SIGNS: ED Triage Vitals  Enc Vitals Group     BP 07/10/16 1908 112/63     Pulse Rate 07/10/16 1908 (!) 111     Resp 07/10/16 1908 20     Temp 07/10/16 1908 99.3 F (37.4 C)     Temp Source 07/10/16 1908 Oral     SpO2 07/10/16 1908 93 %     Weight 07/10/16 1908 250 lb (113.4 kg)     Height 07/10/16 1908 5\' 7"  (1.702 m)  Head Circumference --      Peak Flow --      Pain Score 07/10/16 1909 2     Pain Loc --    Constitutional: Alert and oriented. Well appearing and in no distress. Eyes: Conjunctivae are normal. Normal extraocular movements. ENT   Head: Normocephalic and atraumatic.   Nose: No congestion/rhinnorhea.   Mouth/Throat: Mucous membranes are moist.   Neck: No stridor. Hematological/Lymphatic/Immunilogical: No cervical lymphadenopathy. Cardiovascular: Normal rate, regular rhythm.  No murmurs, rubs, or gallops.  Respiratory: Normal respiratory effort without tachypnea nor retractions. Breath sounds are clear and equal bilaterally. No wheezes/rales/rhonchi. Gastrointestinal: Soft and non tender. No rebound. No guarding.  Genitourinary: Deferred Musculoskeletal: Normal range of motion in all extremities. No lower extremity edema. Neurologic:  Normal speech and language. No gross focal neurologic deficits are appreciated.  Skin:  Roughly 3 cm diameter ulcer noted to right foot. No pus expressed. Some mild surrounding erythema. Foul odor.  Psychiatric: Mood and affect are normal. Speech and behavior are normal. Patient exhibits appropriate insight and judgment.  ____________________________________________    LABS (pertinent positives/negatives)  Labs Reviewed  BASIC METABOLIC  PANEL - Abnormal; Notable for the following:       Result Value   Sodium 131 (*)    Chloride 94 (*)    Glucose, Bld 335 (*)    All other components within normal limits  CBC - Abnormal; Notable for the following:    RDW 15.8 (*)    All other components within normal limits  TROPONIN I - Abnormal; Notable for the following:    Troponin I 0.04 (*)    All other components within normal limits  TROPONIN I - Abnormal; Notable for the following:    Troponin I 0.05 (*)    All other components within normal limits  FIBRIN DERIVATIVES D-DIMER ([facility] ONLY) - Abnormal; Notable for the following:    Fibrin derivatives D-dimer ([facility]) 781 (*)    All other components within normal limits  INFLUENZA PANEL BY PCR (TYPE A & B)     ____________________________________________   EKG  I, Nance Pear, attending physician, personally viewed and interpreted this EKG  EKG Time: 1854 Rate: 114 Rhythm: sinus tachycardia Axis: left axis deviation Intervals: qtc 457 QRS: LAFB ST changes: no st elevation Impression: abnormal ekg   ____________________________________________    RADIOLOGY  CXR  IMPRESSION: No active disease. Aortic atherosclerosis.  Right foot x-ray IMPRESSION:  1. No evidence of fracture or dislocation.  2. Large soft tissue ulceration at the lateral plantar aspect of the  midfoot. No radiopaque foreign bodies seen.    ____________________________________________   PROCEDURES  Procedures  ____________________________________________   INITIAL IMPRESSION / ASSESSMENT AND PLAN / ED COURSE  Pertinent labs & imaging results that were available during my care of the patient were reviewed by me and considered in my medical decision making (see chart for details).  Patient presents to the emergency department today because of concerns for shortness breath and chest pain. Troponin mildly elevated. Patient does have a history of coronary artery disease.  Additionally patient had episodes of hypoxia and emergency department. Because of this a d-dimer was sent and it was elevated. Will proceed with CT angiogram.  In addition he has a foot ulcer. X-ray did not show any obvious osteomyelitis.  ____________________________________________   FINAL CLINICAL IMPRESSION(S) / ED DIAGNOSES  Final diagnoses:  Chest pain, unspecified type  Shortness of breath  Ulcer of right foot, unspecified ulcer stage ([place])  Note: This dictation was prepared with Dragon dictation. Any transcriptional errors that result from this process are unintentional     Nance Pear, MD 07/10/16 2336

## 2016-07-10 NOTE — ED Notes (Signed)
Charge nurse notified of troponin; pt taken to room 1 via w/c and placed in hosp gown and on card monitor for further evaluation; Dr Archie Balboa and care nurse notified

## 2016-07-10 NOTE — ED Triage Notes (Signed)
Pt in via POV with complaints of intermittent left sided chest pain since yesterday, denies radiation.  Pt reports associated sob, dizziness, diaphoresis.  Productive cough noted upon assessment.  Pt with hx of stent placement.  NAD noted at this time.

## 2016-07-10 NOTE — ED Notes (Addendum)
No chest pain.  No sob.  Sinus on monitor with occ pvc

## 2016-07-10 NOTE — ED Notes (Signed)
Pt also reports open wound to bottom of right foot; wound approximately the size of a quarter.  Pt reports recently losing job and insurance and has not seen the wound center since August 2017.

## 2016-07-10 NOTE — ED Notes (Signed)
Pt placed on 2 liters oxygen.  No chest pain or sob.  Pt alert.

## 2016-07-10 NOTE — ED Notes (Signed)
Pt reports left side chest pain for 2 days.  No sob.  No n/v/d.    nonradiating pain.  Pt is diabetic and has an ulcer to bottom of right foot.  Pt also reports increased pain to bottom of right foot/ulcer.  md at bedside.     Pt alert.   Speech clear.

## 2016-07-11 ENCOUNTER — Encounter: Payer: Self-pay | Admitting: Internal Medicine

## 2016-07-11 ENCOUNTER — Telehealth: Payer: Self-pay

## 2016-07-11 DIAGNOSIS — R0902 Hypoxemia: Secondary | ICD-10-CM

## 2016-07-11 DIAGNOSIS — I208 Other forms of angina pectoris: Secondary | ICD-10-CM | POA: Diagnosis not present

## 2016-07-11 DIAGNOSIS — I200 Unstable angina: Secondary | ICD-10-CM

## 2016-07-11 DIAGNOSIS — L97519 Non-pressure chronic ulcer of other part of right foot with unspecified severity: Secondary | ICD-10-CM

## 2016-07-11 DIAGNOSIS — R0602 Shortness of breath: Secondary | ICD-10-CM

## 2016-07-11 DIAGNOSIS — R079 Chest pain, unspecified: Secondary | ICD-10-CM | POA: Diagnosis present

## 2016-07-11 HISTORY — DX: Unstable angina: I20.0

## 2016-07-11 LAB — GLUCOSE, CAPILLARY
Glucose-Capillary: 281 mg/dL — ABNORMAL HIGH (ref 65–99)
Glucose-Capillary: 256 mg/dL — ABNORMAL HIGH (ref 65–99)
Glucose-Capillary: 264 mg/dL — ABNORMAL HIGH (ref 65–99)
Glucose-Capillary: 188 mg/dL — ABNORMAL HIGH (ref 65–99)

## 2016-07-11 LAB — CBC
HCT: 42.5 % (ref 40.0–52.0)
Hemoglobin: 14.3 g/dL (ref 13.0–18.0)
MCH: 28.1 pg (ref 26.0–34.0)
MCHC: 33.7 g/dL (ref 32.0–36.0)
MCV: 83.5 fL (ref 80.0–100.0)
Platelets: 202 10*3/uL (ref 150–440)
RBC: 5.09 MIL/uL (ref 4.40–5.90)
RDW: 15.8 % — ABNORMAL HIGH (ref 11.5–14.5)
WBC: 5.6 10*3/uL (ref 3.8–10.6)

## 2016-07-11 LAB — BASIC METABOLIC PANEL
Anion gap: 7 (ref 5–15)
BUN: 10 mg/dL (ref 6–20)
CO2: 28 mmol/L (ref 22–32)
Calcium: 8.5 mg/dL — ABNORMAL LOW (ref 8.9–10.3)
Chloride: 99 mmol/L — ABNORMAL LOW (ref 101–111)
Creatinine, Ser: 0.88 mg/dL (ref 0.61–1.24)
GFR calc Af Amer: >60 mL/min (ref >60)
GFR calc non Af Amer: >60 mL/min (ref >60)
Glucose, Bld: 298 mg/dL — ABNORMAL HIGH (ref 65–99)
Potassium: 3.7 mmol/L (ref 3.5–5.1)
Sodium: 134 mmol/L — ABNORMAL LOW (ref 135–145)

## 2016-07-11 LAB — TROPONIN I
Troponin I: 0.04 ng/mL (ref <0.03)
Troponin I: 0.04 ng/mL (ref <0.03)

## 2016-07-11 LAB — LIPID PANEL
Cholesterol: 196 mg/dL (ref 0–200)
HDL: 30 mg/dL — ABNORMAL LOW (ref >40)
LDL Cholesterol: 119 mg/dL — ABNORMAL HIGH (ref 0–99)
Total CHOL/HDL Ratio: 6.5 RATIO
Triglycerides: 233 mg/dL — ABNORMAL HIGH (ref <150)
VLDL: 47 mg/dL — ABNORMAL HIGH (ref 0–40)

## 2016-07-11 MED ORDER — CLOPIDOGREL BISULFATE 75 MG PO TABS
75.0000 mg | ORAL_TABLET | Freq: Every day | ORAL | Status: DC
  Administered 2016-07-11: 75 mg via ORAL
  Filled 2016-07-11: qty 1

## 2016-07-11 MED ORDER — INSULIN ASPART 100 UNIT/ML ~~LOC~~ SOLN
0.0000 [IU] | Freq: Every day | SUBCUTANEOUS | Status: DC
Start: 2016-07-11 — End: 2016-07-11

## 2016-07-11 MED ORDER — ALBUTEROL SULFATE (2.5 MG/3ML) 0.083% IN NEBU: 3.0000 mL | INHALATION_SOLUTION | Freq: Four times a day (QID) | RESPIRATORY_TRACT | Status: DC | PRN

## 2016-07-11 MED ORDER — ASPIRIN EC 81 MG PO TBEC: 81.0000 mg | DELAYED_RELEASE_TABLET | Freq: Every day | ORAL | Status: DC

## 2016-07-11 MED ORDER — CLONAZEPAM 1 MG PO TABS: 1.0000 mg | ORAL_TABLET | Freq: Every day | ORAL | Status: DC

## 2016-07-11 MED ORDER — GLIPIZIDE 10 MG PO TABS
10.0000 mg | ORAL_TABLET | Freq: Every day | ORAL | Status: DC
  Administered 2016-07-11: 10 mg via ORAL
  Filled 2016-07-11: qty 1

## 2016-07-11 MED ORDER — ONDANSETRON HCL 4 MG/2ML IJ SOLN: 4.0000 mg | Freq: Four times a day (QID) | INTRAMUSCULAR | Status: DC | PRN

## 2016-07-11 MED ORDER — INSULIN GLARGINE 100 UNIT/ML ~~LOC~~ SOLN
80.0000 [IU] | Freq: Every day | SUBCUTANEOUS | Status: DC
  Administered 2016-07-11: 80 [IU] via SUBCUTANEOUS
  Filled 2016-07-11: qty 0.8

## 2016-07-11 MED ORDER — ATORVASTATIN CALCIUM 20 MG PO TABS: 40.0000 mg | ORAL_TABLET | Freq: Every day | ORAL | Status: DC

## 2016-07-11 MED ORDER — ASPIRIN 81 MG PO CHEW
324.0000 mg | CHEWABLE_TABLET | ORAL | Status: AC
  Administered 2016-07-11: 324 mg via ORAL
  Filled 2016-07-11: qty 4

## 2016-07-11 MED ORDER — MORPHINE SULFATE (PF) 4 MG/ML IV SOLN
INTRAVENOUS | Status: AC
  Filled 2016-07-11: qty 1

## 2016-07-11 MED ORDER — ASPIRIN 300 MG RE SUPP: 300.0000 mg | RECTAL | Status: AC

## 2016-07-11 MED ORDER — INSULIN ASPART 100 UNIT/ML ~~LOC~~ SOLN
3.0000 [IU] | Freq: Three times a day (TID) | SUBCUTANEOUS | Status: DC
  Administered 2016-07-11 (×2): 3 [IU] via SUBCUTANEOUS
  Filled 2016-07-11 (×2): qty 3

## 2016-07-11 MED ORDER — SODIUM CHLORIDE 0.9 % IV SOLN: 250.0000 mL | INTRAVENOUS | Status: DC | PRN

## 2016-07-11 MED ORDER — GABAPENTIN 400 MG PO CAPS
800.0000 mg | ORAL_CAPSULE | Freq: Three times a day (TID) | ORAL | Status: DC
  Administered 2016-07-11: 800 mg via ORAL
  Filled 2016-07-11 (×3): qty 2

## 2016-07-11 MED ORDER — INSULIN ASPART 100 UNIT/ML ~~LOC~~ SOLN
0.0000 [IU] | Freq: Three times a day (TID) | SUBCUTANEOUS | Status: DC
  Administered 2016-07-11: 8 [IU] via SUBCUTANEOUS
  Administered 2016-07-11: 3 [IU] via SUBCUTANEOUS
  Filled 2016-07-11: qty 8
  Filled 2016-07-11: qty 3

## 2016-07-11 MED ORDER — ASPIRIN EC 81 MG PO TBEC
81.0000 mg | DELAYED_RELEASE_TABLET | Freq: Every day | ORAL | Status: DC
  Administered 2016-07-11: 81 mg via ORAL
  Filled 2016-07-11: qty 1

## 2016-07-11 MED ORDER — ISOSORBIDE MONONITRATE ER 30 MG PO TB24: 30.0000 mg | ORAL_TABLET | Freq: Every day | ORAL | 2 refills | Status: DC

## 2016-07-11 MED ORDER — SODIUM CHLORIDE 0.9% FLUSH: 3.0000 mL | INTRAVENOUS | Status: DC | PRN

## 2016-07-11 MED ORDER — ACETAMINOPHEN 325 MG PO TABS
650.0000 mg | ORAL_TABLET | ORAL | Status: DC | PRN
Start: 2016-07-11 — End: 2016-07-11

## 2016-07-11 MED ORDER — ENOXAPARIN SODIUM 40 MG/0.4ML ~~LOC~~ SOLN
40.0000 mg | SUBCUTANEOUS | Status: DC
  Administered 2016-07-11: 40 mg via SUBCUTANEOUS
  Filled 2016-07-11: qty 0.4

## 2016-07-11 MED ORDER — NITROGLYCERIN 0.4 MG SL SUBL: 0.4000 mg | SUBLINGUAL_TABLET | SUBLINGUAL | Status: DC | PRN

## 2016-07-11 MED ORDER — MORPHINE SULFATE (PF) 4 MG/ML IV SOLN
4.0000 mg | Freq: Once | INTRAVENOUS | Status: AC
  Administered 2016-07-11: 4 mg via INTRAVENOUS

## 2016-07-11 MED ORDER — OXYCODONE-ACETAMINOPHEN 5-325 MG PO TABS
1.0000 | ORAL_TABLET | Freq: Four times a day (QID) | ORAL | Status: DC | PRN
  Administered 2016-07-11: 1 via ORAL
  Filled 2016-07-11: qty 1

## 2016-07-11 MED ORDER — SODIUM CHLORIDE 0.9% FLUSH
3.0000 mL | Freq: Two times a day (BID) | INTRAVENOUS | Status: DC
  Administered 2016-07-11 (×2): 3 mL via INTRAVENOUS

## 2016-07-11 NOTE — H&P (Signed)
[facility] [place] NAME: [name] [name]    MR#:  [id number]  DATE OF BIRTH:  02/16/1961  DATE OF ADMISSION:  07/10/2016  PRIMARY CARE PHYSICIAN: Clarisse Gouge, MD   REQUESTING/REFERRING PHYSICIAN:   CHIEF COMPLAINT:   Chief Complaint  Patient presents with  . Chest Pain    HISTORY OF PRESENT ILLNESS: [name] [name]  is a 56 y.o. male with a known history of Coronary artery disease, COPD, cardiac stent, type 2 diabetes mellitus, hyperlipidemia, hypertension, peripheral arterial disease presented to the emergency room with chest pain since yesterday morning. Pain is located in the left-sided chest and dull aching in nature. The pain is 3 out of 10 on a scale of 1-10. Patient was worked up in the emergency room his d-dimer was high, he was evaluated with a CT angiogram of the chest which showed no pulmonary embolism. His troponin is borderline. No complaints of any shortness of breath, orthopnea. Patient has a chronic right foot ulcer secondary to diabetes. Hospitalist service was consulted for further care of the patient.  PAST MEDICAL HISTORY:   Past Medical History:  Diagnosis Date  . Arthritis   . Asthma   . Atherosclerosis   . BPH (benign prostatic hyperplasia)   . Chronic diastolic CHF (congestive heart failure) ([place])    a. 02/2013 EF 50% by LV gram.  . COPD (chronic obstructive pulmonary disease) ([place])   . Coronary artery disease    a. Status post LAD PCI in 2013 at [facility];  b. October 2014 - Nuclear stress test showed inferior wall defect->Cath: patent LAD stent with 90% stenosis in the proximal RCA ( 2.5 x 23 mm DES), EF 50%;  c. 02/2014 MV w/ large inferior scar->Cath: LAD 81m, LCX 9m, 20d, RCA 20p/m, patent stent->Med Rx.  . Diabetes mellitus without complication ([place])   . Hernia   . Hyperlipidemia   . Hypertension   . Morbid obesity ([place])   . Neuropathy ([place])   . PAD (peripheral artery disease) ([place])    a. Angiography in  March of 2012 showed 70-80% proximal right SFA stenosis status post self-expanding stent placement (6X60 mm). Angiography in April 2012 showed proximal left SFA occlusion status post self-expanding stent placement (7 X unkonw length). Both done by Dr. Lucky Cowboy.  . Peripheral vascular disease ([place])   . Sleep apnea   . Syncope and collapse   . Tobacco use    a. 75+ yr hx - still smoking 1ppd, down from 2 ppd.  . Varicose vein     PAST SURGICAL HISTORY: Past Surgical History:  Procedure Laterality Date  . ABDOMINAL AORTAGRAM N/A 06/23/2013   Procedure: ABDOMINAL Maxcine Ham;  Surgeon: Wellington Hampshire, MD;  Location: [facility] CATH [facility];  Service: Cardiovascular;  Laterality: N/A;  . APPENDECTOMY    . CARDIAC CATHETERIZATION  10/14   [facility]; X1 STENT PROXIMAL RCA  . CARDIAC CATHETERIZATION  02/2010   [facility]  . CARDIAC CATHETERIZATION  02/21/2014   [facility]  . CLAVICLE SURGERY    . HERNIA REPAIR    . PERIPHERAL ARTERIAL STENT GRAFT     x2 left/right  . PERIPHERAL VASCULAR CATHETERIZATION N/A 07/06/2015   Procedure: Abdominal Aortogram w/Lower Extremity;  Surgeon: Algernon Huxley, MD;  Location: [facility] CV [facility];  Service: Cardiovascular;  Laterality: N/A;  . PERIPHERAL VASCULAR CATHETERIZATION  07/06/2015   Procedure: Lower Extremity Intervention;  Surgeon: Algernon Huxley, MD;  Location: [facility] CV [facility];  Service: Cardiovascular;;  SOCIAL HISTORY:  Social History  Substance Use Topics  . Smoking status: Current Every Day Smoker    Packs/day: 0.50    Years: 41.00    Types: Cigarettes  . Smokeless tobacco: Never Used  . Alcohol use No    FAMILY HISTORY:  Family History  Problem Relation Age of Onset  . Heart attack Father   . Heart disease Father   . Hypertension Father   . Hyperlipidemia Father   . Hypertension Mother   . Hyperlipidemia Mother     DRUG ALLERGIES: No Known Allergies  REVIEW OF SYSTEMS:   CONSTITUTIONAL: No fever, fatigue or weakness.  EYES: No blurred or double vision.   EARS, NOSE, AND THROAT: No tinnitus or ear pain.  RESPIRATORY: No cough, shortness of breath, wheezing or hemoptysis.  CARDIOVASCULAR: Has chest pain, no orthopnea, edema.  GASTROINTESTINAL: No nausea, vomiting, diarrhea or abdominal pain.  GENITOURINARY: No dysuria, hematuria.  ENDOCRINE: No polyuria, nocturia,  HEMATOLOGY: No anemia, easy bruising or bleeding SKIN: chronic right foot ulcer MUSCULOSKELETAL: No joint pain or arthritis.   NEUROLOGIC: No tingling, numbness, weakness.  PSYCHIATRY: No anxiety or depression.   MEDICATIONS AT HOME:  Prior to Admission medications   Medication Sig Start Date End Date Taking? Authorizing Provider  albuterol (PROVENTIL HFA;VENTOLIN HFA) 108 (90 BASE) MCG/ACT inhaler Inhale 2 puffs into the lungs every 6 (six) hours as needed for wheezing or shortness of breath. 04/15/13  Yes Wellington Hampshire, MD  aspirin EC 81 MG tablet Take 1 tablet (81 mg total) by mouth daily. 05/11/15  Yes Wellington Hampshire, MD  clonazePAM (KLONOPIN) 1 MG tablet Take 1 mg by mouth at bedtime.   Yes Historical Provider, MD  clopidogrel (PLAVIX) 75 MG tablet Take 1 tablet (75 mg total) by mouth daily. 07/06/15  Yes Algernon Huxley, MD  gabapentin (NEURONTIN) 800 MG tablet Take 800 mg by mouth 3 (three) times daily.   Yes Historical Provider, MD  glipiZIDE (GLUCOTROL) 10 MG tablet Take 10 mg by mouth daily before breakfast.   Yes Historical Provider, MD  insulin glargine (LANTUS) 100 unit/mL SOPN Inject 0.8 mLs (80 Units total) into the skin daily. 05/07/16 07/10/16 Yes Sital Mody, MD  insulin lispro (HUMALOG) 100 UNIT/ML injection Inject 0-10 Units into the skin 3 (three) times daily with meals. If BS is 0 to 70 no insulin and eat/glucose tablets/glucose get If BS 71 to 150 great! If BS 151 to 200, 2 Units Humaog If BS 201 to 250, 4 Units Humalog If BS 251 to 300, 6 Units Humalog If BS 301 to 350, 8 Units Humalog If BS 351 to 400, 10 Units Humalog If BS greater than 400, take 10 Units  and Contact our office during office hour   Yes Historical Provider, MD  metFORMIN (GLUCOPHAGE) 1000 MG tablet Take 1,000 mg by mouth 2 (two) times daily with a meal.   Yes Historical Provider, MD  nitroGLYCERIN (NITRODUR - DOSED IN MG/24 HR) 0.4 mg/hr patch Place 0.4 mg onto the skin daily as needed (for chest pain).    Yes Historical Provider, MD  nitroGLYCERIN (NITROSTAT) 0.4 MG SL tablet Place 0.4 mg under the tongue every 5 (five) minutes as needed for chest pain.   Yes Historical Provider, MD  amoxicillin-clavulanate (AUGMENTIN) 875-125 MG tablet Take 1 tablet by mouth 2 (two) times daily. Patient not taking: Reported on 07/10/2016 05/07/16   Bettey Costa, MD  collagenase (SANTYL) ointment Apply topically daily. Patient not taking: Reported on 07/10/2016  05/08/16   Bettey Costa, MD      PHYSICAL EXAMINATION:   VITAL SIGNS: Blood pressure 119/78, pulse 96, temperature 99.3 F (37.4 C), temperature source Oral, resp. rate 18, height 5\' 7"  (1.702 m), weight 113.4 kg (250 lb), SpO2 96 %.  GENERAL:  56 y.o.-year-old patient lying in the bed with no acute distress.  EYES: Pupils equal, round, reactive to light and accommodation. No scleral icterus. Extraocular muscles intact.  HEENT: Head atraumatic, normocephalic. Oropharynx and nasopharynx clear.  NECK:  Supple, no jugular venous distention. No thyroid enlargement, no tenderness.  LUNGS: Normal breath sounds bilaterally, no wheezing, rales,rhonchi or crepitation. No use of accessory muscles of respiration.  CARDIOVASCULAR: S1, S2 normal. No murmurs, rubs, or gallops.  ABDOMEN: Soft, nontender, nondistended. Bowel sounds present. No organomegaly or mass.  EXTREMITIES: No pedal edema, cyanosis, or clubbing.  NEUROLOGIC: Cranial nerves II through XII are intact. Muscle strength 5/5 in all extremities. Sensation intact. Gait not checked.  PSYCHIATRIC: The patient is alert and oriented x 3.  SKIN: right foot ulcer noted.  LABORATORY PANEL:    CBC  Recent Labs Lab 07/10/16 1901  WBC 8.1  HGB 16.3  HCT 49.2  PLT 241  MCV 83.7  MCH 27.8  MCHC 33.2  RDW 15.8*   ------------------------------------------------------------------------------------------------------------------  Chemistries   Recent Labs Lab 07/10/16 1901  NA 131*  K 4.5  CL 94*  CO2 28  GLUCOSE 335*  BUN 9  CREATININE 1.02  CALCIUM 9.2   ------------------------------------------------------------------------------------------------------------------ estimated creatinine clearance is 100.7 mL/min (by C-G formula based on SCr of 1.02 mg/dL). ------------------------------------------------------------------------------------------------------------------ No results for input(s): TSH, T4TOTAL, T3FREE, THYROIDAB in the last 72 hours.  Invalid input(s): FREET3   Coagulation profile No results for input(s): INR, PROTIME in the last 168 hours. ------------------------------------------------------------------------------------------------------------------- No results for input(s): DDIMER in the last 72 hours. -------------------------------------------------------------------------------------------------------------------  Cardiac Enzymes  Recent Labs Lab 07/10/16 1901 07/10/16 2205  TROPONINI 0.04* 0.05*   ------------------------------------------------------------------------------------------------------------------ Invalid input(s): POCBNP  ---------------------------------------------------------------------------------------------------------------  Urinalysis    Component Value Date/Time   COLORURINE YELLOW (A) 11/21/2015 1424   APPEARANCEUR CLEAR (A) 11/21/2015 1424   APPEARANCEUR Clear 02/06/2014 1412   LABSPEC 1.036 (H) 11/21/2015 1424   LABSPEC 1.015 02/06/2014 1412   PHURINE 6.0 11/21/2015 1424   GLUCOSEU >500 (A) 11/21/2015 1424   GLUCOSEU Negative 02/06/2014 1412   HGBUR 1+ (A) 11/21/2015 1424   BILIRUBINUR  NEGATIVE 11/21/2015 1424   BILIRUBINUR Negative 02/06/2014 1412   KETONESUR NEGATIVE 11/21/2015 1424   PROTEINUR 30 (A) 11/21/2015 1424   NITRITE NEGATIVE 11/21/2015 1424   LEUKOCYTESUR NEGATIVE 11/21/2015 1424   LEUKOCYTESUR Negative 02/06/2014 1412     RADIOLOGY: Dg Chest 2 View  Result Date: 07/10/2016 CLINICAL DATA:  Intermittent left-sided chest pain since yesterday. EXAM: CHEST  2 VIEW COMPARISON:  11/12/2015 FINDINGS: Heart size is normal. There is aortic atherosclerosis. The lungs are clear. No effusions. No significant bone finding. IMPRESSION: No active disease.  Aortic atherosclerosis. Electronically Signed   By: Nelson Chimes M.D.   On: 07/10/2016 19:33   Ct Angio Chest Pe W And/or Wo Contrast  Result Date: 07/11/2016 CLINICAL DATA:  Left-sided chest pain for 2 days. EXAM: CT ANGIOGRAPHY CHEST WITH CONTRAST TECHNIQUE: Multidetector CT imaging of the chest was performed using the standard protocol during bolus administration of intravenous contrast. Multiplanar CT image reconstructions and MIPs were obtained to evaluate the vascular anatomy. CONTRAST:  100 mL Isovue 370 intravenous COMPARISON:  05/07/2015 FINDINGS: Cardiovascular: Satisfactory opacification of the pulmonary  arteries to the segmental level. No evidence of pulmonary embolism. Normal heart size. No pericardial effusion. Mild atherosclerotic calcification of the normal caliber thoracic aorta. Coronary artery atherosclerotic calcifications. Mediastinum/Nodes: Mildly prominent mediastinal nodes, nonspecific, measuring up to 1.7 cm in the subcarinal region. Esophagus is unremarkable. Visible portions of the thyroid are unremarkable. Lungs/Pleura: Lungs are clear. No pleural effusion or pneumothorax. Upper Abdomen: No acute abnormality. Musculoskeletal: No significant skeletal lesion. Review of the MIP images confirms the above findings. IMPRESSION: 1. Negative for acute pulmonary embolism 2. Nonspecific mildly prominent  mediastinal nodes. Electronically Signed   By: Andreas Newport M.D.   On: 07/11/2016 00:18   Dg Foot Complete Right  Result Date: 07/10/2016 CLINICAL DATA:  Chronic pain and ulceration at the plantar aspect of the right foot. Initial encounter. EXAM: RIGHT FOOT COMPLETE - 3+ VIEW COMPARISON:  Right foot radiographs performed 05/05/2016, and right foot MRI performed 05/06/2016 FINDINGS: There is no evidence of fracture or dislocation. The joint spaces are preserved. There is no evidence of talar subluxation; the subtalar joint is unremarkable in appearance. Plantar and posterior calcaneal spurs are seen. A large soft tissue ulceration is seen at the lateral plantar aspect of the midfoot, with likely tiny adjacent foci of debris. No radiopaque foreign bodies are seen. IMPRESSION: 1. No evidence of fracture or dislocation. 2. Large soft tissue ulceration at the lateral plantar aspect of the midfoot. No radiopaque foreign bodies seen. Electronically Signed   By: Garald Balding M.D.   On: 07/10/2016 20:45    EKG: Orders placed or performed during the hospital encounter of 07/10/16  . EKG 12-Lead  . EKG 12-Lead  . ED EKG within 10 minutes  . ED EKG within 10 minutes    IMPRESSION AND PLAN: 56 year old male patient with history of coronary artery disease, COPD, hypertension, hyperlipidemia, cardiac stent, type 2 diabetes, right foot ulcer with [place] presented to the emergency room with chest pain. Admitting diagnosis 1. Unstable angina 2. Hyponatremia 3. Uncontrolled diabetes mellitus 4. Chronic right foot ulcer 5. Coronary artery disease 6. Emphysema Treatment plan Admit patient to telemetry observation bed Start patient on aspirin and Plavix DVT prophylaxis subcutaneous Lovenox 40 MG daily Cycle troponin and check for ischemia and check echocardiogram Cardiology consultation Wound care consultation Control diabetes and does with the Lantus insulin and sliding scale coverage Follow-up  electrolytes Supportive care.  All the records are reviewed and case discussed with ED provider. Management plans discussed with the patient, family and they are in agreement.  CODE STATUS:FULL CODE Code Status History    Date Active Date Inactive Code Status Order ID Comments User Context   05/06/2016 12:05 AM 05/07/2016  2:17 PM Full Code [id number]  Harvie Bridge, DO Inpatient   11/21/2015  5:16 PM 11/22/2015  7:50 PM Full Code [id number]  Gladstone Lighter, MD Inpatient   07/06/2015  9:48 AM 07/06/2015  2:37 PM Full Code [id number]  Algernon Huxley, MD Inpatient       TOTAL TIME TAKING CARE OF THIS PATIENT: 52 minutes.    Saundra Shelling M.D on 07/11/2016 at 2:19 AM  Between 7am to 6pm - Pager - [phone]  After 6pm go to [email or web address] - password [email or web address]  [facility]  Office  [phone]  CC: Primary care physician; Clarisse Gouge, MD

## 2016-07-11 NOTE — Care Management (Signed)
Patient placed in observation for chest pain.  he does have history of cardiac disease with prior stenting, insulin dependent diabetes and chronic diabetic ulcers on his foot.  Patient ws referred to the [facility].  Checking with agencies to see if patient completed application process and there is no record that patient completed application or been seen at either.  Faxed his script for Imdur [facility].  Instructed patient on the need to take responsibility to complete applications for the resources he needs.

## 2016-07-11 NOTE — Telephone Encounter (Signed)
-----   Message from Blain Pais sent at 07/11/2016  2:21 PM EST ----- Regarding: tcm/ph 2/8 Christell Faith, PA 2:30

## 2016-07-11 NOTE — Discharge Summary (Signed)
[facility] [place] NAME: [name] [name]    MR#:  [id number]  DATE OF BIRTH:  Feb 16, 1961  DATE OF ADMISSION:  07/10/2016   ADMITTING PHYSICIAN: Saundra Shelling, MD  DATE OF DISCHARGE: 07/11/2016  PRIMARY CARE PHYSICIAN: Clarisse Gouge, MD   ADMISSION DIAGNOSIS:  Shortness of breath [R06.02] Hypoxia [R09.02] Chest pain, unspecified type [R07.9] Ulcer of right foot, unspecified ulcer stage ([place]) [L97.519] DISCHARGE DIAGNOSIS:  Principal Problem:   Chest pain Active Problems:   Unstable angina (HCC)   Hypoxia   Shortness of breath   Ulcer of right foot (HCC)   Stable angina ([place])  SECONDARY DIAGNOSIS:   Past Medical History:  Diagnosis Date  . Arthritis   . Asthma   . Atherosclerosis   . BPH (benign prostatic hyperplasia)   . Chronic diastolic CHF (congestive heart failure) ([place])    a. 02/2013 EF 50% by LV gram.  . COPD (chronic obstructive pulmonary disease) ([place])   . Coronary artery disease    a. Status post LAD PCI in 2013 at [facility];  b. October 2014 - Nuclear stress test showed inferior wall defect->Cath: patent LAD stent with 90% stenosis in the proximal RCA ( 2.5 x 23 mm DES), EF 50%;  c. 02/2014 MV w/ large inferior scar->Cath: LAD 68m, LCX 53m, 20d, RCA 20p/m, patent stent->Med Rx.  . Diabetes mellitus without complication ([place])   . Hernia   . Hyperlipidemia   . Hypertension   . Morbid obesity ([place])   . Neuropathy ([place])   . PAD (peripheral artery disease) ([place])    a. Angiography in March of 2012 showed 70-80% proximal right SFA stenosis status post self-expanding stent placement (6X60 mm). Angiography in April 2012 showed proximal left SFA occlusion status post self-expanding stent placement (7 X unkonw length). Both done by Dr. Lucky Cowboy.  . Peripheral vascular disease ([place])   . Sleep apnea   . Syncope and collapse   . Tobacco use    a. 75+ yr hx - still smoking 1ppd, down from 2 ppd.  . Varicose vein    HOSPITAL COURSE:    56 year old male patient with history of coronary artery disease, COPD, hypertension, hyperlipidemia, cardiac stent, type 2 diabetes, right foot ulcer with [place] presented to the emergency room with chest pain. Admitting diagnosis 1.  recurrent atypical chest pain with borderline elevated troponin. Continue current treatment, add imdur 30 mg by mouth daily and follow up Dr. Fletcher Anon per [place].  2. Hyponatremia. Due to hyperglycemia. Improved. 3. Uncontrolled diabetes mellitus. Lantus insulin and sliding scale 4. Chronic right foot ulcer. Wound care  5. Coronary artery disease. aspirin and Plavix 6. Emphysema  The patient is stable and can be discharged to home today per Dr.Gollan. Discussed with Dr.Gollan. DISCHARGE CONDITIONS:  Stable, discharge to home today. CONSULTS OBTAINED:  Treatment Team:  Minna Merritts, MD DRUG ALLERGIES:  No Known Allergies DISCHARGE MEDICATIONS:   Allergies as of 07/11/2016   No Known Allergies     Medication List    TAKE these medications   albuterol 108 (90 Base) MCG/ACT inhaler Commonly known as:  PROVENTIL HFA;VENTOLIN HFA Inhale 2 puffs into the lungs every 6 (six) hours as needed for wheezing or shortness of breath.   amoxicillin-clavulanate 875-125 MG tablet Commonly known as:  AUGMENTIN Take 1 tablet by mouth 2 (two) times daily.   aspirin EC 81 MG tablet Take 1 tablet (81 mg total) by mouth daily.   clonazePAM 1 MG  tablet Commonly known as:  KLONOPIN Take 1 mg by mouth at bedtime.   clopidogrel 75 MG tablet Commonly known as:  PLAVIX Take 1 tablet (75 mg total) by mouth daily.   collagenase ointment Commonly known as:  SANTYL Apply topically daily.   gabapentin 800 MG tablet Commonly known as:  NEURONTIN Take 800 mg by mouth 3 (three) times daily.   glipiZIDE 10 MG tablet Commonly known as:  GLUCOTROL Take 10 mg by mouth daily before breakfast.   insulin glargine 100 unit/mL Sopn Commonly known as:  LANTUS Inject  0.8 mLs (80 Units total) into the skin daily.   insulin lispro 100 UNIT/ML injection Commonly known as:  HUMALOG Inject 0-10 Units into the skin 3 (three) times daily with meals. If BS is 0 to 70 no insulin and eat/glucose tablets/glucose get If BS 71 to 150 great! If BS 151 to 200, 2 Units Humaog If BS 201 to 250, 4 Units Humalog If BS 251 to 300, 6 Units Humalog If BS 301 to 350, 8 Units Humalog If BS 351 to 400, 10 Units Humalog If BS greater than 400, take 10 Units and Contact our office during office hour   isosorbide mononitrate 30 MG 24 hr tablet Commonly known as:  IMDUR Take 1 tablet (30 mg total) by mouth daily.   metFORMIN 1000 MG tablet Commonly known as:  GLUCOPHAGE Take 1,000 mg by mouth 2 (two) times daily with a meal.   nitroGLYCERIN 0.4 MG SL tablet Commonly known as:  NITROSTAT Place 0.4 mg under the tongue every 5 (five) minutes as needed for chest pain. What changed:  Another medication with the same name was removed. Continue taking this medication, and follow the directions you see here.        DISCHARGE INSTRUCTIONS:  See AVS.  If you experience worsening of your admission symptoms, develop shortness of breath, life threatening emergency, suicidal or homicidal thoughts you must seek medical attention immediately by calling 911 or calling your MD immediately  if symptoms less severe.  You Must read complete instructions/literature along with all the possible adverse reactions/side effects for all the Medicines you take and that have been prescribed to you. Take any new Medicines after you have completely understood and accpet all the possible adverse reactions/side effects.   Please note  You were cared for by a hospitalist during your hospital stay. If you have any questions about your discharge medications or the care you received while you were in the hospital after you are discharged, you can call the unit and asked to speak with the hospitalist on call if  the hospitalist that took care of you is not available. Once you are discharged, your primary care physician will handle any further medical issues. Please note that NO REFILLS for any discharge medications will be authorized once you are discharged, as it is imperative that you return to your primary care physician (or establish a relationship with a primary care physician if you do not have one) for your aftercare needs so that they can reassess your need for medications and monitor your lab values.    On the day of Discharge:  VITAL SIGNS:  Blood pressure 102/66, pulse 97, temperature 98.3 F (36.8 C), temperature source Oral, resp. rate 19, height 5\' 7"  (1.702 m), weight 250 lb (113.4 kg), SpO2 94 %. PHYSICAL EXAMINATION:  GENERAL:  56 y.o.-year-old patient lying in the bed with no acute distress.  EYES: Pupils equal, round, reactive to light  and accommodation. No scleral icterus. Extraocular muscles intact.  HEENT: Head atraumatic, normocephalic. Oropharynx and nasopharynx clear.  NECK:  Supple, no jugular venous distention. No thyroid enlargement, no tenderness.  LUNGS: Normal breath sounds bilaterally, no wheezing, rales,rhonchi or crepitation. No use of accessory muscles of respiration.  CARDIOVASCULAR: S1, S2 normal. No murmurs, rubs, or gallops.  ABDOMEN: Soft, non-tender, non-distended. Bowel sounds present. No organomegaly or mass.  EXTREMITIES: No pedal edema, cyanosis, or clubbing. Right foot in dressing. NEUROLOGIC: Cranial nerves II through XII are intact. Muscle strength 5/5 in all extremities. Sensation intact. Gait not checked.  PSYCHIATRIC: The patient is alert and oriented x 3.  SKIN: No obvious rash, lesion, or ulcer.  DATA REVIEW:   CBC  Recent Labs Lab 07/11/16 0341  WBC 5.6  HGB 14.3  HCT 42.5  PLT 202    Chemistries   Recent Labs Lab 07/11/16 0341  NA 134*  K 3.7  CL 99*  CO2 28  GLUCOSE 298*  BUN 10  CREATININE 0.88  CALCIUM 8.5*      Microbiology Results  Results for orders placed or performed during the hospital encounter of 05/05/16  Blood culture (routine x 2)     Status: None   Collection Time: 05/05/16 10:08 PM  Result Value Ref Range Status   Specimen Description BLOOD RIGHT WRIST  Final   Special Requests   Final    BOTTLES DRAWN AEROBIC AND ANAEROBIC [place]   Culture NO GROWTH 5 DAYS  Final   Report Status 05/10/2016 FINAL  Final  Blood culture (routine x 2)     Status: None   Collection Time: 05/05/16 10:08 PM  Result Value Ref Range Status   Specimen Description BLOOD LEFT HAND  Final   Special Requests BOTTLES DRAWN AEROBIC AND ANAEROBIC [place]  Final   Culture NO GROWTH 5 DAYS  Final   Report Status 05/10/2016 FINAL  Final  Aerobic/Anaerobic Culture (surgical/deep wound)     Status: None   Collection Time: 05/06/16  1:17 PM  Result Value Ref Range Status   Specimen Description ABSCESS  Final   Special Requests NONE  Final   Gram Stain   Final    ABUNDANT WBC PRESENT,BOTH PMN AND MONONUCLEAR ABUNDANT GRAM POSITIVE COCCI IN PAIRS RARE GRAM POSITIVE RODS    Culture   Final    MODERATE GROUP B STREP(S.AGALACTIAE)ISOLATED TESTING AGAINST S. AGALACTIAE NOT ROUTINELY PERFORMED DUE TO PREDICTABILITY OF AMP/PEN/VAN SUSCEPTIBILITY. MODERATE METHICILLIN RESISTANT STAPHYLOCOCCUS AUREUS NO ANAEROBES ISOLATED Performed at [facility]    Report Status 05/11/2016 FINAL  Final   Organism ID, Bacteria METHICILLIN RESISTANT STAPHYLOCOCCUS AUREUS  Final      Susceptibility   Methicillin resistant staphylococcus aureus - MIC*    CIPROFLOXACIN >=8 RESISTANT Resistant     ERYTHROMYCIN >=8 RESISTANT Resistant     GENTAMICIN <=0.5 SENSITIVE Sensitive     OXACILLIN >=4 RESISTANT Resistant     TETRACYCLINE <=1 SENSITIVE Sensitive     VANCOMYCIN 1 SENSITIVE Sensitive     TRIMETH/SULFA <=10 SENSITIVE Sensitive     CLINDAMYCIN >=8 RESISTANT Resistant     RIFAMPIN <=0.5 SENSITIVE  Sensitive     Inducible Clindamycin NEGATIVE Sensitive     * MODERATE METHICILLIN RESISTANT STAPHYLOCOCCUS AUREUS  MRSA PCR Screening     Status: None   Collection Time: 05/06/16  9:35 PM  Result Value Ref Range Status   MRSA by PCR NEGATIVE NEGATIVE Final    Comment:  The GeneXpert MRSA Assay (FDA approved for NASAL specimens only), is one component of a comprehensive MRSA colonization surveillance program. It is not intended to diagnose MRSA infection nor to guide or monitor treatment for MRSA infections.     RADIOLOGY:  Dg Chest 2 View  Result Date: 07/10/2016 CLINICAL DATA:  Intermittent left-sided chest pain since yesterday. EXAM: CHEST  2 VIEW COMPARISON:  11/12/2015 FINDINGS: Heart size is normal. There is aortic atherosclerosis. The lungs are clear. No effusions. No significant bone finding. IMPRESSION: No active disease.  Aortic atherosclerosis. Electronically Signed   By: Nelson Chimes M.D.   On: 07/10/2016 19:33   Ct Angio Chest Pe W And/or Wo Contrast  Result Date: 07/11/2016 CLINICAL DATA:  Left-sided chest pain for 2 days. EXAM: CT ANGIOGRAPHY CHEST WITH CONTRAST TECHNIQUE: Multidetector CT imaging of the chest was performed using the standard protocol during bolus administration of intravenous contrast. Multiplanar CT image reconstructions and MIPs were obtained to evaluate the vascular anatomy. CONTRAST:  100 mL Isovue 370 intravenous COMPARISON:  05/07/2015 FINDINGS: Cardiovascular: Satisfactory opacification of the pulmonary arteries to the segmental level. No evidence of pulmonary embolism. Normal heart size. No pericardial effusion. Mild atherosclerotic calcification of the normal caliber thoracic aorta. Coronary artery atherosclerotic calcifications. Mediastinum/Nodes: Mildly prominent mediastinal nodes, nonspecific, measuring up to 1.7 cm in the subcarinal region. Esophagus is unremarkable. Visible portions of the thyroid are unremarkable. Lungs/Pleura: Lungs are  clear. No pleural effusion or pneumothorax. Upper Abdomen: No acute abnormality. Musculoskeletal: No significant skeletal lesion. Review of the MIP images confirms the above findings. IMPRESSION: 1. Negative for acute pulmonary embolism 2. Nonspecific mildly prominent mediastinal nodes. Electronically Signed   By: Andreas Newport M.D.   On: 07/11/2016 00:18   Dg Foot Complete Right  Result Date: 07/10/2016 CLINICAL DATA:  Chronic pain and ulceration at the plantar aspect of the right foot. Initial encounter. EXAM: RIGHT FOOT COMPLETE - 3+ VIEW COMPARISON:  Right foot radiographs performed 05/05/2016, and right foot MRI performed 05/06/2016 FINDINGS: There is no evidence of fracture or dislocation. The joint spaces are preserved. There is no evidence of talar subluxation; the subtalar joint is unremarkable in appearance. Plantar and posterior calcaneal spurs are seen. A large soft tissue ulceration is seen at the lateral plantar aspect of the midfoot, with likely tiny adjacent foci of debris. No radiopaque foreign bodies are seen. IMPRESSION: 1. No evidence of fracture or dislocation. 2. Large soft tissue ulceration at the lateral plantar aspect of the midfoot. No radiopaque foreign bodies seen. Electronically Signed   By: Garald Balding M.D.   On: 07/10/2016 20:45     Management plans discussed with the patient, family and they are in agreement.  CODE STATUS:     Code Status Orders        Start     Ordered   07/11/16 0325  Full code  Continuous     07/11/16 0324    Code Status History    Date Active Date Inactive Code Status Order ID Comments User Context   05/06/2016 12:05 AM 05/07/2016  2:17 PM Full Code [id number]  Harvie Bridge, DO Inpatient   11/21/2015  5:16 PM 11/22/2015  7:50 PM Full Code [id number]  Gladstone Lighter, MD Inpatient   07/06/2015  9:48 AM 07/06/2015  2:37 PM Full Code [id number]  Algernon Huxley, MD Inpatient      TOTAL TIME TAKING CARE OF THIS PATIENT: 33 minutes.     Demetrios Loll M.D on 07/11/2016 at 2:59 PM  Between 7am to 6pm - Pager - [phone]  After 6pm go to [email or web address] - Proofreader  [facility]  Office  [phone]  CC: Primary care physician; Clarisse Gouge, MD   Note: This dictation was prepared with Dragon dictation along with smaller phrase technology. Any transcriptional errors that result from this process are unintentional.

## 2016-07-11 NOTE — Telephone Encounter (Signed)
Attempted to contact pt regarding discharge from [facility] on 07/11/16. Left message asking pt to call back regarding discharge instructions and/or medications. Advised pt of appt w/ Christell Faith, PA on 07/19/15 at 2:30 w/ [facility]. Asked pt to call back if unable to keep this appt.

## 2016-07-11 NOTE — ED Provider Notes (Signed)
-----------------------------------------   12:35 AM on 07/11/2016 -----------------------------------------  CT chest interpreted per Dr. Alroy Dust: 1. Negative for acute pulmonary embolism  2. Nonspecific mildly prominent mediastinal nodes.   Updated patient of CT results. Complains of right foot pain secondary to ulcer. Will administer IV analgesia and discuss with hospitalist to evaluate patient in the emergency department for admission.   Paulette Blanch, MD 07/11/16 [phone]

## 2016-07-11 NOTE — ED Notes (Signed)
Pt has right foot pain.  meds given.  md at bedside.  nsr on monitor.  No chest pain or sob.

## 2016-07-11 NOTE — ED Notes (Signed)
Report called to [facility] rn floor nurse

## 2016-07-11 NOTE — Consult Note (Addendum)
Cardiology Consultation Note  Patient ID: [name] [name], MRN: [id number], DOB/AGE: 01/13/1961 56 y.o. Admit date: 07/10/2016   Date of Consult: 07/11/2016 Primary Physician: Clarisse Gouge, MD Primary Cardiologist: Fletcher Anon  Chief Complaint: chest pain Reason for Consult: Chest pain, rule out MI, known coronary artery disease Physician requesting consult: Dr. Estanislado Pandy   HPI: 56 y.o. male with h/o  history of coronary artery disease with previous stenting of the LAD in 2013 at [facility] and proximal RCA stent in October 2014,  history of peripheral arterial disease status post bilateral SFA stenting in 2012,  type 2 diabetes, hypertension, hyperlipidemia and tobacco use Who presented to the emergency room with chest pain me left side admitted to the hospital for rule out MI  In the emergency room he had elevated d-dimer, CT scan chest showing no PE Borderline elevated troponin on arrival He reports having chronic right foot ulcer secondary to diabetes Cardiac enzymes 0.04 four times  On further discussion he reports chest pain is chronic, comes and goes,Not always associated with exertion. Typically he applies Nitropaste to his chest with improvement of his symptoms Thinks he may be using more Nitropaste recently, was worried so he wanted to get checked out  Continues to smoke, trying to quit Worried about large ulcer right foot that is nonhealing for the past 7 months  Other past medical history reviewed  lower extremity angiography done in 2015 for increased claudication which showed: 1. No significant aortoiliac disease.  2. Significant right common femoral artery stenosis. Patent mid SFA stent with Three-vessel runoff below the knee.  3. Occluded left SFA just proximal to the previously placed stent with reconstitution distally via very good collaterals from the profunda.   cardiac catheterization in September 2015 for chest pain which showed patent stents with nonobstructive  disease.  seen by Ignacia Bayley in June of this year for chest pain.  He underwent a nuclear stress test which showed no evidence of ischemia.  Echocardiogram showed normal LV systolic function with grade 1 diastolic dysfunction.   previousmergency room visit for chest pain.  He complains of dull type chest pain on the left side which is overall mild but worsens with activities.  This has been associated with shortness of breath.  Basic workup in the emergency room was negative.  He complains of bilateral calf pain right worse than left after walking about one block. He is trying to quit smoking and is down to a few cigarettes a day. He is also using electronic cigarettes. He has not been taking atorvastatin or cilostazol.   Past Medical History:  Diagnosis Date  . Arthritis   . Asthma   . Atherosclerosis   . BPH (benign prostatic hyperplasia)   . Chronic diastolic CHF (congestive heart failure) ([place])    a. 02/2013 EF 50% by LV gram.  . COPD (chronic obstructive pulmonary disease) ([place])   . Coronary artery disease    a. Status post LAD PCI in 2013 at [facility];  b. October 2014 - Nuclear stress test showed inferior wall defect->Cath: patent LAD stent with 90% stenosis in the proximal RCA ( 2.5 x 23 mm DES), EF 50%;  c. 02/2014 MV w/ large inferior scar->Cath: LAD 72m, LCX 94m, 20d, RCA 20p/m, patent stent->Med Rx.  . Diabetes mellitus without complication ([place])   . Hernia   . Hyperlipidemia   . Hypertension   . Morbid obesity ([place])   . Neuropathy ([place])   . PAD (peripheral artery disease) ([place])  a. Angiography in March of 2012 showed 70-80% proximal right SFA stenosis status post self-expanding stent placement (6X60 mm). Angiography in April 2012 showed proximal left SFA occlusion status post self-expanding stent placement (7 X unkonw length). Both done by Dr. Lucky Cowboy.  . Peripheral vascular disease ([place])   . Sleep apnea   . Syncope and collapse   . Tobacco use    a. 75+ yr hx - still  smoking 1ppd, down from 2 ppd.  . Varicose vein       Most Recent Cardiac Studies: Previous stress test June 2016 with no ischemia  CT scan chest done yesterday with no PE Echocardiogram July 2016 with normal ejection fraction estimated greater than 55%    Surgical History:  Past Surgical History:  Procedure Laterality Date  . ABDOMINAL AORTAGRAM N/A 06/23/2013   Procedure: ABDOMINAL Maxcine Ham;  Surgeon: Wellington Hampshire, MD;  Location: [place] CATH LAB;  Service: Cardiovascular;  Laterality: N/A;  . APPENDECTOMY    . CARDIAC CATHETERIZATION  10/14   [facility]; X1 STENT PROXIMAL RCA  . CARDIAC CATHETERIZATION  02/2010   [facility]  . CARDIAC CATHETERIZATION  02/21/2014   [facility]  . CLAVICLE SURGERY    . HERNIA REPAIR    . PERIPHERAL ARTERIAL STENT GRAFT     x2 left/right  . PERIPHERAL VASCULAR CATHETERIZATION N/A 07/06/2015   Procedure: Abdominal Aortogram w/Lower Extremity;  Surgeon: Algernon Huxley, MD;  Location: [place] CV LAB;  Service: Cardiovascular;  Laterality: N/A;  . PERIPHERAL VASCULAR CATHETERIZATION  07/06/2015   Procedure: Lower Extremity Intervention;  Surgeon: Algernon Huxley, MD;  Location: [place] CV LAB;  Service: Cardiovascular;;     Home Meds: Prior to Admission medications   Medication Sig Start Date End Date Taking? Authorizing Provider  albuterol (PROVENTIL HFA;VENTOLIN HFA) 108 (90 BASE) MCG/ACT inhaler Inhale 2 puffs into the lungs every 6 (six) hours as needed for wheezing or shortness of breath. 04/15/13  Yes Wellington Hampshire, MD  aspirin EC 81 MG tablet Take 1 tablet (81 mg total) by mouth daily. 05/11/15  Yes Wellington Hampshire, MD  clonazePAM (KLONOPIN) 1 MG tablet Take 1 mg by mouth at bedtime.   Yes Historical Provider, MD  clopidogrel (PLAVIX) 75 MG tablet Take 1 tablet (75 mg total) by mouth daily. 07/06/15  Yes Algernon Huxley, MD  gabapentin (NEURONTIN) 800 MG tablet Take 800 mg by mouth 3 (three) times daily.   Yes Historical Provider, MD  glipiZIDE (GLUCOTROL) 10 MG  tablet Take 10 mg by mouth daily before breakfast.   Yes Historical Provider, MD  insulin glargine (LANTUS) 100 unit/mL SOPN Inject 0.8 mLs (80 Units total) into the skin daily. 05/07/16 07/10/16 Yes Sital Mody, MD  insulin lispro (HUMALOG) 100 UNIT/ML injection Inject 0-10 Units into the skin 3 (three) times daily with meals. If BS is 0 to 70 no insulin and eat/glucose tablets/glucose get If BS 71 to 150 great! If BS 151 to 200, 2 Units Humaog If BS 201 to 250, 4 Units Humalog If BS 251 to 300, 6 Units Humalog If BS 301 to 350, 8 Units Humalog If BS 351 to 400, 10 Units Humalog If BS greater than 400, take 10 Units and Contact our office during office hour   Yes Historical Provider, MD  metFORMIN (GLUCOPHAGE) 1000 MG tablet Take 1,000 mg by mouth 2 (two) times daily with a meal.   Yes Historical Provider, MD  nitroGLYCERIN (NITRODUR - DOSED IN MG/24 HR) 0.4 mg/hr patch  Place 0.4 mg onto the skin daily as needed (for chest pain).    Yes Historical Provider, MD  nitroGLYCERIN (NITROSTAT) 0.4 MG SL tablet Place 0.4 mg under the tongue every 5 (five) minutes as needed for chest pain.   Yes Historical Provider, MD  amoxicillin-clavulanate (AUGMENTIN) 875-125 MG tablet Take 1 tablet by mouth 2 (two) times daily. Patient not taking: Reported on 07/10/2016 05/07/16   Bettey Costa, MD  collagenase (SANTYL) ointment Apply topically daily. Patient not taking: Reported on 07/10/2016 05/08/16   Bettey Costa, MD    Inpatient Medications:  . aspirin EC  81 mg Oral Daily  . clonazePAM  1 mg Oral QHS  . clopidogrel  75 mg Oral Daily  . enoxaparin (LOVENOX) injection  40 mg Subcutaneous Q24H  . gabapentin  800 mg Oral TID  . glipiZIDE  10 mg Oral QAC breakfast  . insulin aspart  0-15 Units Subcutaneous TID WC  . insulin aspart  0-5 Units Subcutaneous QHS  . insulin aspart  3 Units Subcutaneous TID WC  . insulin glargine  80 Units Subcutaneous Daily  . sodium chloride flush  3 mL Intravenous Q12H      Allergies: No Known Allergies  Social History   Social History  . Marital status: Single    Spouse name: N/A  . Number of children: N/A  . Years of education: N/A   Occupational History  . on disability    Social History Main Topics  . Smoking status: Current Every Day Smoker    Packs/day: 0.50    Years: 41.00    Types: Cigarettes  . Smokeless tobacco: Never Used  . Alcohol use No  . Drug use: No  . Sexual activity: Not on file   Other Topics Concern  . Not on file   Social History Narrative   Lives at home with family. Independent at baseline.     Family History  Problem Relation Age of Onset  . Heart attack Father   . Heart disease Father   . Hypertension Father   . Hyperlipidemia Father   . Hypertension Mother   . Hyperlipidemia Mother      Review of Systems: Review of Systems  Constitutional: Negative.   Respiratory: Negative.   Cardiovascular: Positive for chest pain.  Gastrointestinal: Negative.   Musculoskeletal: Negative.   Neurological: Negative.   Psychiatric/Behavioral: Negative.   All other systems reviewed and are negative.   Labs: CBC  Recent Labs  07/10/16 1901 07/11/16 0341  WBC 8.1 5.6  HGB 16.3 14.3  HCT 49.2 42.5  MCV 83.7 83.5  PLT 241 174   Basic Metabolic Panel  Recent Labs  07/10/16 1901 07/11/16 0341  NA 131* 134*  K 4.5 3.7  CL 94* 99*  CO2 28 28  GLUCOSE 335* 298*  BUN 9 10  CREATININE 1.02 0.88  CALCIUM 9.2 8.5*   Liver Function Tests No results for input(s): AST, ALT, ALKPHOS, BILITOT, PROT, ALBUMIN in the last 72 hours. No results for input(s): LIPASE, AMYLASE in the last 72 hours. Cardiac Enzymes  Recent Labs  07/10/16 2205 07/11/16 0341 07/11/16 0923  TROPONINI 0.05* 0.04* 0.04*   BNP Invalid input(s): POCBNP D-Dimer No results for input(s): DDIMER in the last 72 hours. Hemoglobin A1C No results for input(s): HGBA1C in the last 72 hours. Fasting Lipid Panel  Recent Labs   07/11/16 0341  CHOL 196  HDL 30*  LDLCALC 119*  TRIG 233*  CHOLHDL 6.5   Thyroid Function Tests No  results for input(s): TSH, T4TOTAL, T3FREE, THYROIDAB in the last 72 hours.  Invalid input(s): FREET3  Radiology/Studies:  Dg Chest 2 View  Result Date: 07/10/2016 CLINICAL DATA:  Intermittent left-sided chest pain since yesterday. EXAM: CHEST  2 VIEW COMPARISON:  11/12/2015 FINDINGS: Heart size is normal. There is aortic atherosclerosis. The lungs are clear. No effusions. No significant bone finding. IMPRESSION: No active disease.  Aortic atherosclerosis. Electronically Signed   By: Nelson Chimes M.D.   On: 07/10/2016 19:33   Ct Angio Chest Pe W And/or Wo Contrast  Result Date: 07/11/2016 CLINICAL DATA:  Left-sided chest pain for 2 days. EXAM: CT ANGIOGRAPHY CHEST WITH CONTRAST TECHNIQUE: Multidetector CT imaging of the chest was performed using the standard protocol during bolus administration of intravenous contrast. Multiplanar CT image reconstructions and MIPs were obtained to evaluate the vascular anatomy. CONTRAST:  100 mL Isovue 370 intravenous COMPARISON:  05/07/2015 FINDINGS: Cardiovascular: Satisfactory opacification of the pulmonary arteries to the segmental level. No evidence of pulmonary embolism. Normal heart size. No pericardial effusion. Mild atherosclerotic calcification of the normal caliber thoracic aorta. Coronary artery atherosclerotic calcifications. Mediastinum/Nodes: Mildly prominent mediastinal nodes, nonspecific, measuring up to 1.7 cm in the subcarinal region. Esophagus is unremarkable. Visible portions of the thyroid are unremarkable. Lungs/Pleura: Lungs are clear. No pleural effusion or pneumothorax. Upper Abdomen: No acute abnormality. Musculoskeletal: No significant skeletal lesion. Review of the MIP images confirms the above findings. IMPRESSION: 1. Negative for acute pulmonary embolism 2. Nonspecific mildly prominent mediastinal nodes. Electronically Signed   By:  Andreas Newport M.D.   On: 07/11/2016 00:18   Dg Foot Complete Right  Result Date: 07/10/2016 CLINICAL DATA:  Chronic pain and ulceration at the plantar aspect of the right foot. Initial encounter. EXAM: RIGHT FOOT COMPLETE - 3+ VIEW COMPARISON:  Right foot radiographs performed 05/05/2016, and right foot MRI performed 05/06/2016 FINDINGS: There is no evidence of fracture or dislocation. The joint spaces are preserved. There is no evidence of talar subluxation; the subtalar joint is unremarkable in appearance. Plantar and posterior calcaneal spurs are seen. A large soft tissue ulceration is seen at the lateral plantar aspect of the midfoot, with likely tiny adjacent foci of debris. No radiopaque foreign bodies are seen. IMPRESSION: 1. No evidence of fracture or dislocation. 2. Large soft tissue ulceration at the lateral plantar aspect of the midfoot. No radiopaque foreign bodies seen. Electronically Signed   By: Garald Balding M.D.   On: 07/10/2016 20:45    EKG: EKG showing normal sinus rhythm with no significant ST or T-wave changes  EKG lab work, chest x-ray, echocardiogram reviewed independently by myself  Weights: Filed Weights   07/10/16 1908  Weight: 250 lb (113.4 kg)     Physical Exam: Telemetry reviewed by myself showing normal sinus rhythm Blood pressure 102/66, pulse 97, temperature 98.3 F (36.8 C), temperature source Oral, resp. rate 19, height 5\' 7"  (1.702 m), weight 250 lb (113.4 kg), SpO2 94 %. Body mass index is 39.16 kg/m. GEN: Well nourished, well developed, in no acute distress.  HEENT: Grossly normal.  Neck: Supple, no JVD, carotid bruits, or masses. Cardiac: RRR, no murmurs, rubs, or gallops. No clubbing, cyanosis, edema.  Radials/DP/PT 2+ and equal bilaterally.  Respiratory:  Mildly decreased breath sounds bilaterally,  clear to auscultation bilaterally. GI: Soft, nontender, nondistended, BS + x 4. MS: no deformity or atrophy. Right foot with bandage in place,  unable to see ulcer Skin: warm and dry, no rash. Neuro:  Strength and sensation are  intact. Psych: AAOx3.  Normal affect.    Assessment and Plan:   1) Stable angina Patient presenting with chest pain, known coronary artery disease No significant change on EKG, cardiac enzymes negative 4 Currently pain-free Reports long history of chest pain, stuttering Uses nitro paste at home with improvement of his symptoms Prior workup including stress test catheterization within the past 2-3 years Denies having reproducible symptoms on exertion, seems to come and go  Previous catheterization and stress test performed for similar symptoms, showing patent stents, no ischemia on stress testing   -Recommended we start isosorbide mononitrate 30 mg daily We'll have to proceed slowly given borderline low blood pressure If he continues to have chest pain symptoms we could add ranexa twice a day  Would recommend follow-up in cardiology clinic with Dr. Fletcher Anon if symptoms persist despite medical management, may need repeat evaluation/testing  Echocardiogram has been ordered by admitting doctor yesterday   if unable to perform echocardiogram today to confirm ejection fraction, this could be done as an outpatient   2) uncontrolled diabetes II On insulin at home Nonhealing wound right foot for the past 7 months Strongly encouraged aggressive diabetes control for wound healing, underlying vascular disease Most recent hemoglobin A1c in her system is 10.3  3) hyperlipidemia Currently not at goal, total cholesterol 196, LDL 119 In the setting of poor control diabetes History of noncompliance with his Lipitor Would consider restarting his statin  4) PAD: Followed by Dr. Fletcher Anon, needs follow-up in clinic Previous SFA stenting Occluded left SFA   Total encounter time more than 110 minutes  Greater than 50% was spent in counseling and coordination of care with the patient   Signed, Esmond Plants, MD,  Ph.D [facility] 07/11/2016

## 2016-07-11 NOTE — Discharge Instructions (Signed)
Heart healthy and ADA diet. Smoking cessation  Wound care of right foot.

## 2016-07-11 NOTE — Consult Note (Signed)
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  Wound type:Neuropathic Pressure Injury POA: Yes Measurement: 3 cm x 2.4 cm x 0.5 cm  Wound PJS:UNHR pink nongranulating Drainage (amount, consistency, odor) Moderate serosanguinous  No odor.  Periwound:INtact Dressing procedure/placement/frequency:Cleanse wound to right plantar foot with NS.  Apply Iodoform packing strip to wound bed, filling in depth.  Cover with 4x4 gauze and kerlix/tape.  Change daily.  Send patient home with wound supplies on discharge.  Will not follow at this time.  Please re-consult if needed.  Domenic Moras RN BSN [place] Pager [phone]

## 2016-07-11 NOTE — Progress Notes (Signed)
Pt arrived from ER via stretcher. A&O x 4. Telemetry monitor applied and called to CCMD. Pt has diabetic foot ulcer to right foot. Cleansed with saline and wet to dry applied. Orientated to room, fall contract signed. Pt is refusing the SDD's and the bed alarm at this time.

## 2016-07-11 NOTE — ED Notes (Signed)
Pt alert.  Watching tv.    Pt waiting on admission.  No chest pain.  No sob.  Skin warm and dry   Iv in place.

## 2016-07-11 NOTE — Progress Notes (Signed)
Inpatient Diabetes Program Recommendations  AACE/ADA: New Consensus Statement on Inpatient Glycemic Control (2015)  Target Ranges:  Prepandial:   less than 140 mg/dL      Peak postprandial:   less than 180 mg/dL (1-2 hours)      Critically ill patients:  140 - 180 mg/dL   Lab Results  Component Value Date   GLUCAP 256 (H) 07/11/2016   HGBA1C 10.3 (H) 11/21/2015    Review of Glycemic Control  Results for [name] [name], [name] [name] (MRN [id number]) as of 07/11/2016 10:26  Ref. Range 07/11/2016 04:15 07/11/2016 06:41 07/11/2016 07:28  Glucose-Capillary Latest Ref Range: 65 - 99 mg/dL 281 (H) 264 (H) 256 (H)    Diabetes history: Type 2 Outpatient Diabetes medications: Lantus 80 units qam, Humalog 0-10 units tid ("never anymore than 5units), Glucotrol 10mg  qday, Glucophage 1000mg  bid (see note below)  Current orders for Inpatient glycemic control: Lantus 80 units qam, Novolog 0-10 units tid, Glucotrol 10mg  qday, Glucophage 1000mg  bid, Novolog 3 units tid, Novolog moderate correction scale tid, Novolog 0-5 units qhs  Inpatient Diabetes Program Recommendations:  Spoke to patient by phone today- he tells me he does not consistently take his Glipizide or his Glucophage because he doesn't have insurance.  He takes Lantus 80 units daily and Humalog bid with breakfast and supper because his girlfriend has Medicare and she takes the same medication so he uses hers.  He said he never skips the insulin.    He is a patient of [facility], [facility] at [facility] in [place].  He tells me he has filled out paperwork to receive medication assistance but has not heard anything back after he submitted the paperwork.     Consider stopping Glipizide while inpatient- high risk of hypoglycemia if the patient needs to be NPO.    Per ADA recommendations "consider performing an A1C on all patients with diabetes or hyperglycemia admitted to the hospital if not performed in the prior 3 months".   Gentry Fitz, RN, BA, MHA,  CDE Diabetes Coordinator Inpatient Diabetes Program  [phone] (Team Pager) [phone] ([place]) 07/11/2016 10:41 AM

## 2016-07-18 ENCOUNTER — Encounter: Payer: Self-pay | Admitting: Cardiology

## 2016-07-18 ENCOUNTER — Encounter: Payer: BLUE CROSS/BLUE SHIELD | Admitting: Physician Assistant

## 2016-07-18 ENCOUNTER — Ambulatory Visit (INDEPENDENT_AMBULATORY_CARE_PROVIDER_SITE_OTHER): Payer: Self-pay | Admitting: Cardiology

## 2016-07-18 VITALS — BP 100/70 | HR 103 | Ht 66.0 in | Wt 247.5 lb

## 2016-07-18 DIAGNOSIS — E782 Mixed hyperlipidemia: Secondary | ICD-10-CM

## 2016-07-18 DIAGNOSIS — Z720 Tobacco use: Secondary | ICD-10-CM

## 2016-07-18 DIAGNOSIS — I208 Other forms of angina pectoris: Secondary | ICD-10-CM

## 2016-07-18 DIAGNOSIS — I739 Peripheral vascular disease, unspecified: Secondary | ICD-10-CM

## 2016-07-18 DIAGNOSIS — M7989 Other specified soft tissue disorders: Secondary | ICD-10-CM

## 2016-07-18 DIAGNOSIS — I25118 Atherosclerotic heart disease of native coronary artery with other forms of angina pectoris: Secondary | ICD-10-CM

## 2016-07-18 MED ORDER — POTASSIUM CHLORIDE ER 10 MEQ PO TBCR: 10.0000 meq | EXTENDED_RELEASE_TABLET | Freq: Every day | ORAL | 0 refills | Status: DC

## 2016-07-18 MED ORDER — FUROSEMIDE 20 MG PO TABS: 20.0000 mg | ORAL_TABLET | Freq: Every day | ORAL | 0 refills | Status: DC

## 2016-07-18 NOTE — Patient Instructions (Signed)
Medication Instructions:  Your physician has recommended you make the following change in your medication:  1. START Lasix (Furosemide) 20 mg Once a day for 1 week 2. START Potassium 10 meq Once a day 1 week  Check with Primary care physician or Pulmonary doctor about switching inhaler to Xopenex.   Labwork: Your physician recommends that you return for lab work to the [facility] next Friday 07/26/16.   Follow-Up: Your physician recommends that you schedule a follow-up appointment in: 2-4 weeks with Dr. Fletcher Anon or Christell Faith PA.   It was a pleasure seeing you today here in the office. Please do not hesitate to give [place] a call back if you have any further questions. [place], BSN

## 2016-07-18 NOTE — Progress Notes (Signed)
Cardiology Office Note   Date:  07/18/2016   ID:  [name] [name], DOB August 30, 1960, MRN [id number]  Referring Doctor:  Clarisse Gouge, MD   Cardiologist:   Dr. Fletcher Anon  Reason for consultation:  Chief Complaint  Patient presents with  . OTHER    F/u ED due to Chest pain/sinus tach. Pt seen Arida 2016. Meds reviewed verbally with pt.      History of Present Illness: [name] [name] is a 56 y.o. male who presents for ffup/transitional care for  high complexity disease  56 y.o. male with h/o  history of coronary artery disease with previous stenting of the LAD in 2013 at [facility] and proximal RCA stent in October 2014,  history of peripheral arterial disease status post bilateral SFA stenting in 2012,  type 2 diabetes, hypertension, hyperlipidemia and tobacco use  Presented to ER 07/11/2016 for chest pain. Borderline elevated troponin of 0.04 x 3, thought to be stable angina, with recent negative workup for ischemia. Was seen by Dr.Gollan. Started on Imdur with plan of follow-up with Dr. Fletcher Anon.  Pt reports today that he has not had any more CP, he thinks the Imdur helped.  No palpitations. SOB is at baseline and chronic. He has to use his inhaler Albuterol 3x a day at least. He says that he has another inhaler, but it is not listed on his med list. Also reports that the swelling on his legs have bee nthere for a long time, R more than L leg. No pain in legs. He has ulcer on bottom left foot.  No syncope, abd pain, bleeding, headache.  ROS:  Please see the history of present illness. Aside from mentioned under HPI, all other systems are reviewed and negative.     Past Medical History:  Diagnosis Date  . Arthritis   . Asthma   . Atherosclerosis   . BPH (benign prostatic hyperplasia)   . Chronic diastolic CHF (congestive heart failure) ([place])    a. 02/2013 EF 50% by LV gram.  . COPD (chronic obstructive pulmonary disease) ([place])   . Coronary artery disease    a. Status post LAD PCI in  2013 at [facility];  b. October 2014 - Nuclear stress test showed inferior wall defect->Cath: patent LAD stent with 90% stenosis in the proximal RCA ( 2.5 x 23 mm DES), EF 50%;  c. 02/2014 MV w/ large inferior scar->Cath: LAD 19m, LCX 65m, 20d, RCA 20p/m, patent stent->Med Rx.  . Diabetes mellitus without complication ([place])   . Hernia   . Hyperlipidemia   . Hypertension   . Morbid obesity ([place])   . Neuropathy ([place])   . PAD (peripheral artery disease) ([place])    a. Angiography in March of 2012 showed 70-80% proximal right SFA stenosis status post self-expanding stent placement (6X60 mm). Angiography in April 2012 showed proximal left SFA occlusion status post self-expanding stent placement (7 X unkonw length). Both done by Dr. Lucky Cowboy.  . Peripheral vascular disease ([place])   . Sleep apnea   . Syncope and collapse   . Tobacco use    a. 75+ yr hx - still smoking 1ppd, down from 2 ppd.  . Varicose vein     Past Surgical History:  Procedure Laterality Date  . ABDOMINAL AORTAGRAM N/A 06/23/2013   Procedure: ABDOMINAL Maxcine Ham;  Surgeon: Wellington Hampshire, MD;  Location: [place] CATH LAB;  Service: Cardiovascular;  Laterality: N/A;  . APPENDECTOMY    . CARDIAC CATHETERIZATION  10/14   [place];  X1 STENT PROXIMAL RCA  . CARDIAC CATHETERIZATION  02/2010   [facility]  . CARDIAC CATHETERIZATION  [facility]/2014   [facility]  . CLAVICLE SURGERY    . HERNIA REPAIR    . PERIPHERAL ARTERIAL STENT GRAFT     x2 left/right  . PERIPHERAL VASCULAR CATHETERIZATION N/A 07/06/2015   Procedure: Abdominal Aortogram w/Lower Extremity;  Surgeon: Algernon Huxley, MD;  Location: [place] CV LAB;  Service: Cardiovascular;  Laterality: N/A;  . PERIPHERAL VASCULAR CATHETERIZATION  07/06/2015   Procedure: Lower Extremity Intervention;  Surgeon: Algernon Huxley, MD;  Location: [place] CV LAB;  Service: Cardiovascular;;     reports that he has been smoking Cigarettes.  He has a 20.50 pack-year smoking history. He has never used smokeless tobacco. He  reports that he does not drink alcohol or use drugs.   family history includes Heart attack in his father; Heart disease in his father; Hyperlipidemia in his father and mother; Hypertension in his father and mother.   Outpatient Medications Prior to Visit  Medication Sig Dispense Refill  . amoxicillin-clavulanate (AUGMENTIN) 875-125 MG tablet Take 1 tablet by mouth 2 (two) times daily. 20 tablet 0  . aspirin EC 81 MG tablet Take 1 tablet (81 mg total) by mouth daily. 90 tablet 3  . clonazePAM (KLONOPIN) 1 MG tablet Take 1 mg by mouth at bedtime.    . clopidogrel (PLAVIX) 75 MG tablet Take 1 tablet (75 mg total) by mouth daily. 30 tablet 11  . collagenase (SANTYL) ointment Apply topically daily. 15 g 0  . gabapentin (NEURONTIN) 800 MG tablet Take 800 mg by mouth 3 (three) times daily.    [name] glipiZIDE (GLUCOTROL) 10 MG tablet Take 10 mg by mouth daily before breakfast.    . insulin lispro (HUMALOG) 100 UNIT/ML injection Inject 0-10 Units into the skin 3 (three) times daily with meals. If BS is 0 to 70 no insulin and eat/glucose tablets/glucose get If BS 71 to 150 great! If BS 151 to 200, 2 Units Humaog If BS 201 to 250, 4 Units Humalog If BS 251 to 300, 6 Units Humalog If BS 301 to 350, 8 Units Humalog If BS 351 to 400, 10 Units Humalog If BS greater than 400, take 10 Units and Contact our office during office hour    . isosorbide mononitrate (IMDUR) 30 MG 24 hr tablet Take 1 tablet (30 mg total) by mouth daily. 30 tablet 2  . metFORMIN (GLUCOPHAGE) 1000 MG tablet Take 1,000 mg by mouth 2 (two) times daily with a meal.    . nitroGLYCERIN (NITROSTAT) 0.4 MG SL tablet Place 0.4 mg under the tongue every 5 (five) minutes as needed for chest pain.    [name] albuterol (PROVENTIL HFA;VENTOLIN HFA) 108 (90 BASE) MCG/ACT inhaler Inhale 2 puffs into the lungs every 6 (six) hours as needed for wheezing or shortness of breath. (Patient not taking: Reported on 07/18/2016) 1 Inhaler 2  . insulin glargine (LANTUS)  100 unit/mL SOPN Inject 0.8 mLs (80 Units total) into the skin daily. 24 mL 0   No facility-administered medications prior to visit.      Allergies: Dulaglutide    PHYSICAL EXAM: VS:  BP 100/70 (BP Location: Left Arm, Patient Position: Sitting, Cuff Size: Normal)   Pulse (!) 103   Ht 5\' 6"  (1.676 m)   Wt 247 lb 8 oz (112.3 kg)   BMI 39.95 kg/m  , Body mass index is 39.95 kg/m. Wt Readings from Last 3 Encounters:  07/18/16  247 lb 8 oz (112.3 kg)  07/10/16 250 lb (113.4 kg)  05/06/16 253 lb 14.4 oz (115.2 kg)    GENERAL:  well developed, well nourished, obese, not in acute distress HEENT: normocephalic, pink conjunctivae, anicteric sclerae, no xanthelasma, normal dentition, oropharynx clear NECK:  Mild neck vein engorgement, carotid upstroke brisk and symmetric, no bruit, no thyromegaly, no lymphadenopathy LUNGS:  good respiratory effort, clear to auscultation bilaterally CV:  PMI not displaced, no thrills, no lifts, S1 and S2 within normal limits, no palpable S3 or S4, no murmurs, no rubs, no gallops ABD:  Soft, nontender, normoactive bowel sounds, no appreciable abdominal aortic bruit, difficult to assess for hepatomegaly, splenomegaly MS: nontender back, no kyphosis, no scoliosis, no joint deformities EXT:  Poor distal DP/PT pulses, ++ R > L edema, no varicosities, no cyanosis, no clubbing SKIN: warm, nondiaphoretic,  NEUROPSYCH: alert, oriented to person, place, and time, sensory/motor grossly intact, normal mood, appropriate affect  Recent Labs: 11/12/2015: B Natriuretic Peptide 37.0 05/05/2016: ALT 13 07/11/2016: BUN 10; Creatinine, Ser 0.88; Hemoglobin 14.3; Platelets 202; Potassium 3.7; Sodium 134   Lipid Panel    Component Value Date/Time   CHOL 196 07/11/2016 0341   CHOL 211 (H) 04/05/2013 0520   TRIG 233 (H) 07/11/2016 0341   TRIG 321 (H) 04/05/2013 0520   HDL 30 (L) 07/11/2016 0341   HDL 34 (L) 04/05/2013 0520   CHOLHDL 6.5 07/11/2016 0341   VLDL 47 (H) 07/11/2016  0341   VLDL 64 (H) 04/05/2013 0520   LDLCALC 119 (H) 07/11/2016 0341   LDLCALC 113 (H) 04/05/2013 0520     Other studies Reviewed:  EKG:  The ekg from 07/18/2016 was personally reviewed by me and it revealed sinustach 103 bpm, LAFB, TWI - similar to previous EKG 07/10/2016  Additional studies/ records that were reviewed personally reviewed by me today include:  Echo 12/31/2014: Left ventricle: The cavity size was normal. There was mild   concentric hypertrophy. Systolic function was normal. The   estimated ejection fraction was in the range of 55% to 60%. Wall   motion was normal; there were no regional wall motion   abnormalities. Doppler parameters are consistent with abnormal   left ventricular relaxation (grade 1 diastolic dysfunction).  Nuclear stress test 11/28/2014:  Pharmacological myocardial perfusion imaging study with no significant ischemia.  There is a small region of decreased perfusion in the apical region consistent with attenuation artifact, though a small region of ischemia can not be excluded.  The left ventricular ejection fraction is moderately decreased (30-44%). Septal wall hypokinesis noted. Unclear if this is secondary to significant ectopy/APCs.  No significant EKG changes concerning for ischemia.  This is a low risk study.  ASSESSMENT AND PLAN: CAD - Status post LAD PCI in 2013 at [facility];   - October 2014 - Nuclear stress test showed inferior wall defect->Cath: patent LAD stent with 90% stenosis in the proximal RCA ( 2.5 x 23 mm DES), EF 50%;   - 02/2014 MV w/ large inferior scar->Cath: LAD 42m, LCX 48m, 20d, RCA 20p/m, patent stent->Med Rx. - nuclear stress test 11/2014 - no significant ischemia Stable angina, improved on Imdur. Tolerating this. On asa. Plavix - started in hospital for borderline troponins presumably.  Has not been on BB presumably due to borderline BP and significant COPD Pt has issues with compliance including to statin therapy. Will  resend for atorvastatin 40mg  po qhs which he was previously on. Need rpt cmp and flp in 3 months.  Risk  factor modification  PAD Significant right common femoral artery stenosis. Patent mid SFA stent with Three-vessel runoff below the knee.  3. Occluded left SFA just proximal to the previously placed stent with reconstitution distally via very good collaterals from the profunda. Pt was on Pletal before and stopped it, noncompliant. Cont ASA, reassess need for Plavix on next visit.  Stop smoking! Resume statin therapy  Hyperlipidemia Resume statin therapy as above. LDL goal < 70.  Leg swelling, SOB, NVE Poss CHF, diastolic dysfunction, acute on chronic A lot of this is related to dietary indiscretion Trial of lasix 20mg  po qd  And KCl x 1 week and rpt BMP. Reassess in 2 weeks  Tachycardia Heart rate on the higher side, sinus but pt does not feel it. Review of previous notes/vital signs/EKGs show HR has bee in the 90s Related to underlying COPD and use of albuterol > 3x a day Recommend to ffup with PCP/Pulm to consider switching to Xopenex  Obesity Body mass index is 39.95 kg/m.[name] Recommend aggressive weight loss through diet and increased physical activity.   Tobacco use We discussed the importance of smoking cessation and different strategies for quitting.   *Pt asked about prescription for oxycodone and was informed that there is no indication for this from cardiac standpoint and that he shd check with his PCP  Current medicines are reviewed at length with the patient today.  The patient does not have concerns regarding medicines.  Labs/ tests ordered today include:  Orders Placed This Encounter  Procedures  . Basic metabolic panel    I had a lengthy and detailed discussion with the patient regarding diagnoses, prognosis, diagnostic options, treatment options , and side effects of medications.   I counseled the patient on importance of lifestyle modification including  heart healthy diet, regular physical activity , and smoking cessation.   Disposition:   FU with Dr Fletcher Anon or Christell Faith, PA in 2-4 weeks  Signed, Wende Bushy, MD  07/18/2016 5:18 PM    [place]  This note was generated in part with voice recognition software and I apologize for any typographical errors that were not detected and corrected.

## 2016-07-19 ENCOUNTER — Telehealth: Payer: Self-pay | Admitting: *Deleted

## 2016-07-19 MED ORDER — ATORVASTATIN CALCIUM 40 MG PO TABS: 40.0000 mg | ORAL_TABLET | Freq: Every day | ORAL | 1 refills | Status: DC

## 2016-07-19 NOTE — Telephone Encounter (Signed)
-----   Message from Wende Bushy, MD sent at 07/19/2016  6:56 AM EST ----- I was reviewing his meds again and arida's note. He shd be on atorvastatin. Can we pls resend for atorvastatin 40mg  po qhs #30 with 1 refill? He ffs up with ryan or Dr. Fletcher Anon. Thanks.

## 2016-07-19 NOTE — Telephone Encounter (Signed)
Left voicemail message to call back and will place prescription up front for patient to pick up.

## 2016-07-19 NOTE — Telephone Encounter (Signed)
Spoke with patients girlfriend [name] [name] and let her know that Dr. Yvone Neu wanted him to start on Atorvastatin 40 mg once daily in the evening. Let her know that I would place a written prescription up front for them to pick up whenever convenient. She verbalized understanding with no further questions at this time.

## 2016-07-23 NOTE — Addendum Note (Signed)
Addended by: Britt Bottom on: 07/23/2016 07:23 AM   Modules accepted: Orders

## 2016-08-01 ENCOUNTER — Ambulatory Visit: Payer: Self-pay | Admitting: Cardiovascular Disease

## 2016-08-14 ENCOUNTER — Encounter (INDEPENDENT_AMBULATORY_CARE_PROVIDER_SITE_OTHER): Payer: Self-pay

## 2016-08-14 ENCOUNTER — Ambulatory Visit: Payer: Self-pay | Admitting: Pharmacy Technician

## 2016-08-14 DIAGNOSIS — Z79899 Other long term (current) drug therapy: Secondary | ICD-10-CM

## 2016-08-15 NOTE — Progress Notes (Addendum)
Met with patient completed financial assistance application for [place] due to recent hospital visit.  Patient agreed to be responsible for gathering financial information and forwarding to appropriate department in [facility].    Completed [facility] application and contract.  Patient agreed to all terms of the [facility] contract.  Provided patient with community resource material based on her particular needs.    Patient is uninsured.  Referred patient to [facility].  Patient refused.  Patient has been approved for charity care with [facility].  Sees Dr. Kirkland Hun with [facility].  Does not pay anything for his visits.  Patients feels his continuum of care will be better seeing Dr. Kirkland Hun.  Patient approved to receive medication assistance at [facility], as long as patient's income does not exceed 250% [employer] or pt obtains prescription coverage.  Novolog, Lantus & Nitrostat Prescription Applications completed with patient.  Forwarded to Dr. [facility] for signature.  Upon receipt of signed applications from provider, Novolog Prescription Application will be submitted to [employer], Lantus Prescription Application will be submitted to [employer] and Nitrostat Prescription Application will be submitted to [employer].  [facility]

## 2016-09-02 ENCOUNTER — Encounter: Payer: Self-pay | Admitting: Pharmacist

## 2016-09-05 ENCOUNTER — Telehealth: Payer: Self-pay | Admitting: Pharmacist

## 2016-09-05 NOTE — Telephone Encounter (Signed)
Nitrostat - 09/05/16 Faxed Pfizer application for [employer] 0.4mg --Place one tablet under the tongue every 15 minutes as needed for chest pain. Call 911 if no relief after 3rd tablet.  Lantus Solostar [place] - 09/05/16 Faxed [employer] application for [employer] - Inject 100 units under the skin every morning once a day. Diagnosis code submitted:E11.40.

## 2016-09-12 ENCOUNTER — Telehealth: Payer: Self-pay | Admitting: Pharmacist

## 2016-09-12 NOTE — Telephone Encounter (Signed)
09/12/16 Faxed Ventolin HFA (inhale 2 puffs every 6 hours as needed) & Breo Ellipta (Inhale 1 puff daily) scripts and application to [place] for PAP enrollment 07/20/55 Faxed [employer] application for Humalog Kwikpen - Max daily dose 30 units (inject under the skin per sliding scale)  Sliding Scale before meals: if BS 0 -70 no insulin and eat/glucose tablets, if BS 71-150 Great, if BS 151-200 -inject 2 units, if BS 201-250 -inject 4 units, if BS 251-300 -inject 6 units, if BS 301-350 -inject 8 units, if BS 351-400 -inject 10 units, if BS greater than 400 -inject 10 units and contact office during office hours.

## 2016-11-13 ENCOUNTER — Telehealth: Payer: Self-pay | Admitting: Pharmacist

## 2016-11-13 NOTE — Telephone Encounter (Signed)
11/13/16 Placed refill online with [place] for Ventolin HFA & Breo Ellipta 100/25, will release 12/12/16, order# [id number].

## 2016-12-16 ENCOUNTER — Telehealth: Payer: Self-pay | Admitting: Pharmacist

## 2016-12-16 NOTE — Telephone Encounter (Signed)
12/16/16 Faxed refill request to [employer] for Humalog Kwikpen Max daily dose 30 units daily.Delos Haring

## 2016-12-24 ENCOUNTER — Telehealth: Payer: Self-pay | Admitting: Pharmacist

## 2016-12-24 NOTE — Telephone Encounter (Signed)
12/24/16 Lantus Solostar Inject 100 units under the skin once a day in the morning, Faxed [employer] application for Re Enrollment.Delos Haring

## 2017-01-21 ENCOUNTER — Telehealth: Payer: Self-pay | Admitting: Pharmacist

## 2017-01-21 NOTE — Telephone Encounter (Signed)
01/21/17 Placed refills online with [employer] for Breo Ellipta 100/25 mcg Inhale 1 puff daily, & Ventolin HFA 90 mcg Inhale 2 puffs every 6 hours as needed. To release 02/06/17, order# [id number].[name] [name]

## 2017-02-25 ENCOUNTER — Ambulatory Visit (INDEPENDENT_AMBULATORY_CARE_PROVIDER_SITE_OTHER): Payer: Self-pay | Admitting: Vascular Surgery

## 2017-04-16 ENCOUNTER — Telehealth: Payer: Self-pay | Admitting: Pharmacist

## 2017-04-16 NOTE — Telephone Encounter (Signed)
04/16/17 Faxed [employer] refill request for Humalog Kwikpen Max daily dose 30 units # 3 Inject daily under the skin per sliding scale.Delos Haring

## 2017-04-22 ENCOUNTER — Inpatient Hospital Stay
Admission: EM | Admit: 2017-04-22 | Discharge: 2017-04-29 | DRG: 286 | Disposition: A | Discharge status: Home / Self Care | Payer: Medicaid Other | Attending: Internal Medicine | Admitting: Internal Medicine

## 2017-04-22 ENCOUNTER — Emergency Department: Payer: Medicaid Other

## 2017-04-22 ENCOUNTER — Other Ambulatory Visit: Payer: Self-pay

## 2017-04-22 DIAGNOSIS — J209 Acute bronchitis, unspecified: Secondary | ICD-10-CM | POA: Diagnosis present

## 2017-04-22 DIAGNOSIS — J189 Pneumonia, unspecified organism: Secondary | ICD-10-CM | POA: Diagnosis present

## 2017-04-22 DIAGNOSIS — E1151 Type 2 diabetes mellitus with diabetic peripheral angiopathy without gangrene: Secondary | ICD-10-CM | POA: Diagnosis present

## 2017-04-22 DIAGNOSIS — I444 Left anterior fascicular block: Secondary | ICD-10-CM | POA: Diagnosis present

## 2017-04-22 DIAGNOSIS — Z794 Long term (current) use of insulin: Secondary | ICD-10-CM

## 2017-04-22 DIAGNOSIS — Z79899 Other long term (current) drug therapy: Secondary | ICD-10-CM

## 2017-04-22 DIAGNOSIS — N400 Enlarged prostate without lower urinary tract symptoms: Secondary | ICD-10-CM | POA: Diagnosis present

## 2017-04-22 DIAGNOSIS — Z95828 Presence of other vascular implants and grafts: Secondary | ICD-10-CM

## 2017-04-22 DIAGNOSIS — G4730 Sleep apnea, unspecified: Secondary | ICD-10-CM | POA: Diagnosis present

## 2017-04-22 DIAGNOSIS — I25118 Atherosclerotic heart disease of native coronary artery with other forms of angina pectoris: Secondary | ICD-10-CM | POA: Diagnosis present

## 2017-04-22 DIAGNOSIS — Z9111 Patient's noncompliance with dietary regimen: Secondary | ICD-10-CM

## 2017-04-22 DIAGNOSIS — E11621 Type 2 diabetes mellitus with foot ulcer: Secondary | ICD-10-CM | POA: Diagnosis present

## 2017-04-22 DIAGNOSIS — M1990 Unspecified osteoarthritis, unspecified site: Secondary | ICD-10-CM | POA: Diagnosis present

## 2017-04-22 DIAGNOSIS — G629 Polyneuropathy, unspecified: Secondary | ICD-10-CM | POA: Diagnosis present

## 2017-04-22 DIAGNOSIS — F419 Anxiety disorder, unspecified: Secondary | ICD-10-CM | POA: Diagnosis present

## 2017-04-22 DIAGNOSIS — Z955 Presence of coronary angioplasty implant and graft: Secondary | ICD-10-CM

## 2017-04-22 DIAGNOSIS — J441 Chronic obstructive pulmonary disease with (acute) exacerbation: Secondary | ICD-10-CM | POA: Diagnosis present

## 2017-04-22 DIAGNOSIS — I70203 Unspecified atherosclerosis of native arteries of extremities, bilateral legs: Secondary | ICD-10-CM | POA: Diagnosis present

## 2017-04-22 DIAGNOSIS — I509 Heart failure, unspecified: Secondary | ICD-10-CM

## 2017-04-22 DIAGNOSIS — I700 Atherosclerosis of aorta: Secondary | ICD-10-CM | POA: Diagnosis present

## 2017-04-22 DIAGNOSIS — Z7951 Long term (current) use of inhaled steroids: Secondary | ICD-10-CM

## 2017-04-22 DIAGNOSIS — I429 Cardiomyopathy, unspecified: Secondary | ICD-10-CM | POA: Diagnosis present

## 2017-04-22 DIAGNOSIS — Z7982 Long term (current) use of aspirin: Secondary | ICD-10-CM

## 2017-04-22 DIAGNOSIS — I872 Venous insufficiency (chronic) (peripheral): Secondary | ICD-10-CM | POA: Diagnosis present

## 2017-04-22 DIAGNOSIS — Z9119 Patient's noncompliance with other medical treatment and regimen: Secondary | ICD-10-CM

## 2017-04-22 DIAGNOSIS — I252 Old myocardial infarction: Secondary | ICD-10-CM

## 2017-04-22 DIAGNOSIS — E1140 Type 2 diabetes mellitus with diabetic neuropathy, unspecified: Secondary | ICD-10-CM | POA: Diagnosis present

## 2017-04-22 DIAGNOSIS — I110 Hypertensive heart disease with heart failure: Principal | ICD-10-CM | POA: Diagnosis present

## 2017-04-22 DIAGNOSIS — Z79891 Long term (current) use of opiate analgesic: Secondary | ICD-10-CM

## 2017-04-22 DIAGNOSIS — J440 Chronic obstructive pulmonary disease with acute lower respiratory infection: Secondary | ICD-10-CM | POA: Diagnosis present

## 2017-04-22 DIAGNOSIS — I493 Ventricular premature depolarization: Secondary | ICD-10-CM | POA: Diagnosis present

## 2017-04-22 DIAGNOSIS — J9601 Acute respiratory failure with hypoxia: Secondary | ICD-10-CM | POA: Diagnosis present

## 2017-04-22 DIAGNOSIS — Z7902 Long term (current) use of antithrombotics/antiplatelets: Secondary | ICD-10-CM

## 2017-04-22 DIAGNOSIS — Z888 Allergy status to other drugs, medicaments and biological substances status: Secondary | ICD-10-CM

## 2017-04-22 DIAGNOSIS — Z6838 Body mass index (BMI) 38.0-38.9, adult: Secondary | ICD-10-CM

## 2017-04-22 DIAGNOSIS — I5043 Acute on chronic combined systolic (congestive) and diastolic (congestive) heart failure: Secondary | ICD-10-CM | POA: Diagnosis present

## 2017-04-22 DIAGNOSIS — E875 Hyperkalemia: Secondary | ICD-10-CM | POA: Diagnosis present

## 2017-04-22 DIAGNOSIS — L97509 Non-pressure chronic ulcer of other part of unspecified foot with unspecified severity: Secondary | ICD-10-CM | POA: Diagnosis present

## 2017-04-22 DIAGNOSIS — E785 Hyperlipidemia, unspecified: Secondary | ICD-10-CM | POA: Diagnosis present

## 2017-04-22 DIAGNOSIS — F17210 Nicotine dependence, cigarettes, uncomplicated: Secondary | ICD-10-CM | POA: Diagnosis present

## 2017-04-22 DIAGNOSIS — I5022 Chronic systolic (congestive) heart failure: Secondary | ICD-10-CM

## 2017-04-22 LAB — CBC
HCT: 39.2 % — ABNORMAL LOW (ref 40.0–52.0)
Hemoglobin: 12.6 g/dL — ABNORMAL LOW (ref 13.0–18.0)
MCH: 24 pg — ABNORMAL LOW (ref 26.0–34.0)
MCHC: 32.2 g/dL (ref 32.0–36.0)
MCV: 74.7 fL — ABNORMAL LOW (ref 80.0–100.0)
Platelets: 270 10*3/uL (ref 150–440)
RBC: 5.24 MIL/uL (ref 4.40–5.90)
RDW: 18.6 % — ABNORMAL HIGH (ref 11.5–14.5)
WBC: 10.2 10*3/uL (ref 3.8–10.6)

## 2017-04-22 LAB — BASIC METABOLIC PANEL
Anion gap: 9 (ref 5–15)
BUN: 11 mg/dL (ref 6–20)
CO2: 27 mmol/L (ref 22–32)
Calcium: 8.7 mg/dL — ABNORMAL LOW (ref 8.9–10.3)
Chloride: 97 mmol/L — ABNORMAL LOW (ref 101–111)
Creatinine, Ser: 0.71 mg/dL (ref 0.61–1.24)
GFR calc Af Amer: >60 mL/min (ref >60)
GFR calc non Af Amer: >60 mL/min (ref >60)
Glucose, Bld: 291 mg/dL — ABNORMAL HIGH (ref 65–99)
Potassium: 4 mmol/L (ref 3.5–5.1)
Sodium: 133 mmol/L — ABNORMAL LOW (ref 135–145)

## 2017-04-22 LAB — TROPONIN I: Troponin I: 0.03 ng/mL (ref <0.03)

## 2017-04-22 MED ORDER — IPRATROPIUM-ALBUTEROL 0.5-2.5 (3) MG/3ML IN SOLN
3.0000 mL | Freq: Once | RESPIRATORY_TRACT | Status: AC
  Administered 2017-04-22: 3 mL via RESPIRATORY_TRACT
  Filled 2017-04-22: qty 3

## 2017-04-22 MED ORDER — FUROSEMIDE 10 MG/ML IJ SOLN
40.0000 mg | Freq: Once | INTRAMUSCULAR | Status: AC
  Administered 2017-04-22: 40 mg via INTRAVENOUS
  Filled 2017-04-22: qty 4

## 2017-04-22 NOTE — ED Notes (Signed)
ED Provider at bedside. 

## 2017-04-22 NOTE — ED Triage Notes (Signed)
Pt arrives to ED with c/o of SOB and productive cough (brown sputum) x few weeks. States COPD, asthma hx. States difficult to take a deep breath. Denies any fever at home. States DM and has diabetic ulcers on feet. States thinks he has a lung infection, saw a doctor a few weeks ago and thought he had antibiotics but didn't so hasn't taken any antibiotics. Does NOT wear oxygen at baseline. Hx of stent placement.

## 2017-04-22 NOTE — ED Notes (Signed)
Lab results reviewed. Troponin elevated at 0.03. Similar results in past lab results.

## 2017-04-22 NOTE — ED Triage Notes (Signed)
Patient to ER for c/o shortness of breath. Patient reports h/o COPD and current "lung infection". Patient denies any known fevers, but reports being hypertensive. Patient ambulatory to bathroom without any difficulty while waiting for triage.

## 2017-04-22 NOTE — ED Provider Notes (Signed)
[facility] Emergency Department Provider Note    First MD Initiated Contact with Patient 04/22/17 2317     (approximate)  I have reviewed the triage vital signs and the nursing notes.   HISTORY  Chief Complaint Shortness of Breath   HPI [name] [name] is a 56 y.o. male with below list of chronic medical conditions presents to the emergency department with 2-week history of productive cough ([place]worth sputum), progressive dyspnea, orthopnea.  Patient denies any fever.  Patient denies any chest pain.  Patient does admit to bilateral lower extremity swelling however denies any pain in the lower extremities.   Past Medical History:  Diagnosis Date  . Arthritis   . Asthma   . Atherosclerosis   . BPH (benign prostatic hyperplasia)   . Chronic diastolic CHF (congestive heart failure) ([place])    a. 02/2013 EF 50% by LV gram.  . COPD (chronic obstructive pulmonary disease) ([place])   . Coronary artery disease    a. Status post LAD PCI in 2013 at [facility];  b. October 2014 - Nuclear stress test showed inferior wall defect->Cath: patent LAD stent with 90% stenosis in the proximal RCA ( 2.5 x 23 mm DES), EF 50%;  c. 02/2014 MV w/ large inferior scar->Cath: LAD 64m, LCX 70m, 20d, RCA 20p/m, patent stent->Med Rx.  . Diabetes mellitus without complication ([place])   . Hernia   . Hyperlipidemia   . Hypertension   . Morbid obesity ([place])   . Neuropathy   . PAD (peripheral artery disease) ([place])    a. Angiography in March of 2012 showed 70-80% proximal right SFA stenosis status post self-expanding stent placement (6X60 mm). Angiography in April 2012 showed proximal left SFA occlusion status post self-expanding stent placement (7 X unkonw length). Both done by Dr. Lucky Cowboy.  . Peripheral vascular disease ([place])   . Sleep apnea   . Syncope and collapse   . Tobacco use    a. 75+ yr hx - still smoking 1ppd, down from 2 ppd.  . Varicose vein     Patient Active Problem List   Diagnosis Date  Noted  . Chest pain 07/11/2016  . Unstable angina ([place]) 07/11/2016  . Hypoxia   . Shortness of breath   . Ulcer of right foot ([place])   . Stable angina ([facility])   . Pressure injury of skin 05/06/2016  . Diabetic foot infection ([place]) 05/05/2016  . Cellulitis 11/21/2015  . Morbid obesity ([place]) 11/17/2014  . PAD (peripheral artery disease) ([place])   . Coronary artery disease   . Hyperlipidemia   . Hypertension   . Tobacco use     Past Surgical History:  Procedure Laterality Date  . APPENDECTOMY    . CARDIAC CATHETERIZATION  10/14   [facility]; X1 STENT PROXIMAL RCA  . CARDIAC CATHETERIZATION  02/2010   [facility]  . CARDIAC CATHETERIZATION  02/21/2014   [facility]  . CLAVICLE SURGERY    . HERNIA REPAIR    . PERIPHERAL ARTERIAL STENT GRAFT     x2 left/right    Prior to Admission medications   Medication Sig Start Date End Date Taking? Authorizing Provider  albuterol (PROVENTIL HFA;VENTOLIN HFA) 108 (90 BASE) MCG/ACT inhaler Inhale 2 puffs into the lungs every 6 (six) hours as needed for wheezing or shortness of breath. Patient not taking: Reported on 07/18/2016 04/15/13   Wellington Hampshire, MD  amoxicillin-clavulanate (AUGMENTIN) 875-125 MG tablet Take 1 tablet by mouth 2 (two) times daily. 05/07/16   Bettey Costa,  MD  aspirin EC 81 MG tablet Take 1 tablet (81 mg total) by mouth daily. 05/11/15   Wellington Hampshire, MD  atorvastatin (LIPITOR) 40 MG tablet Take 1 tablet (40 mg total) by mouth daily at 6 PM. 07/19/16 10/17/16  Wellington Hampshire, MD  Budesonide-Formoterol Fumarate (SYMBICORT IN) Inhale into the lungs as needed.    [provider]  clonazePAM (KLONOPIN) 1 MG tablet Take 1 mg by mouth at bedtime.    [provider]  clopidogrel (PLAVIX) 75 MG tablet Take 1 tablet (75 mg total) by mouth daily. 07/06/15   Algernon Huxley, MD  collagenase (SANTYL) ointment Apply topically daily. 05/08/16   Bettey Costa, MD  furosemide (LASIX) 20 MG tablet Take 1 tablet (20 mg total) by mouth daily. 07/18/16  10/16/16  Wende Bushy, MD  gabapentin (NEURONTIN) 800 MG tablet Take 800 mg by mouth 3 (three) times daily.    [provider]  glipiZIDE (GLUCOTROL) 10 MG tablet Take 10 mg by mouth daily before breakfast.    [provider]  insulin glargine (LANTUS) 100 unit/mL SOPN Inject 0.8 mLs (80 Units total) into the skin daily. 05/07/16 07/10/16  Bettey Costa, MD  insulin lispro (HUMALOG) 100 UNIT/ML injection Inject 0-10 Units into the skin 3 (three) times daily with meals. If BS is 0 to 70 no insulin and eat/glucose tablets/glucose get If BS 71 to 150 great! If BS 151 to 200, 2 Units Humaog If BS 201 to 250, 4 Units Humalog If BS 251 to 300, 6 Units Humalog If BS 301 to 350, 8 Units Humalog If BS 351 to 400, 10 Units Humalog If BS greater than 400, take 10 Units and Contact our office during office hour    [provider]  isosorbide mononitrate (IMDUR) 30 MG 24 hr tablet Take 1 tablet (30 mg total) by mouth daily. 07/11/16   Demetrios Loll, MD  metFORMIN (GLUCOPHAGE) 1000 MG tablet Take 1,000 mg by mouth 2 (two) times daily with a meal.    [provider]  nitroGLYCERIN (NITROSTAT) 0.4 MG SL tablet Place 0.4 mg under the tongue every 5 (five) minutes as needed for chest pain.    [provider]  potassium chloride (K-DUR) 10 MEQ tablet Take 1 tablet (10 mEq total) by mouth daily. 07/18/16 10/16/16  Wende Bushy, MD    Allergies Dulaglutide  Family History  Problem Relation Age of Onset  . Heart attack Father   . Heart disease Father   . Hypertension Father   . Hyperlipidemia Father   . Hypertension Mother   . Hyperlipidemia Mother     Social History Social History   Tobacco Use  . Smoking status: Current Every Day Smoker    Packs/day: 0.50    Years: 41.00    Pack years: 20.50    Types: Cigarettes  . Smokeless tobacco: Never Used  Substance Use Topics  . Alcohol use: No  . Drug use: No    Review of Systems Constitutional: No  fever/chills Eyes: No visual changes. ENT: No sore throat. Cardiovascular: Denies chest pain. Respiratory:  positive for dyspnea and cough Gastrointestinal: No abdominal pain.  No nausea, no vomiting.  No diarrhea.  No constipation. Genitourinary: Negative for dysuria. Musculoskeletal: Negative for neck pain.  Negative for back pain.  Positive for bilateral leg swelling Integumentary: Negative for rash. Neurological: Negative for headaches, focal weakness or numbness.   ____________________________________________   PHYSICAL EXAM:  VITAL SIGNS: ED Triage Vitals  Enc Vitals Group  BP 04/22/17 1938 130/89     Pulse Rate 04/22/17 1938 (!) 108     Resp 04/22/17 1938 20     Temp 04/22/17 1938 98.7 F (37.1 C)     Temp Source 04/22/17 1938 Oral     SpO2 04/22/17 1938 95 %     Weight 04/22/17 1939 108.9 kg (240 lb)     Height 04/22/17 1939 1.702 m (5\' 7" )     Head Circumference --      Peak Flow --      Pain Score 04/22/17 1938 0     Pain Loc --      Pain Edu? --      Excl. in [place]? --     Constitutional: Alert and oriented. Well appearing and in no acute distress. Eyes: Conjunctivae are normal.  Head: Atraumatic. Mouth/Throat: Mucous membranes are moist.  Oropharynx non-erythematous. Neck: No stridor.   Cardiovascular: Normal rate, regular rhythm. Good peripheral circulation. Grossly normal heart sounds. Respiratory: Normal respiratory effort.  No retractions.  Diffuse rhonchi, bibasilar rales Gastrointestinal: Soft and nontender. No distention.  Musculoskeletal: 2+ bilateral lower extremity pitting edema.  Right foot plantar aspect diabetic wound  neurologic:  Normal speech and language. No gross focal neurologic deficits are appreciated.  Skin:  Skin is warm, dry and intact. No rash noted. Psychiatric: Mood and affect are normal. Speech and behavior are normal.  ____________________________________________   LABS (all labs ordered are listed, but only abnormal  results are displayed)  Labs Reviewed  BASIC METABOLIC PANEL - Abnormal; Notable for the following components:      Result Value   Sodium 133 (*)    Chloride 97 (*)    Glucose, Bld 291 (*)    Calcium 8.7 (*)    All other components within normal limits  CBC - Abnormal; Notable for the following components:   Hemoglobin 12.6 (*)    HCT 39.2 (*)    MCV 74.7 (*)    MCH 24.0 (*)    RDW 18.6 (*)    All other components within normal limits  TROPONIN I - Abnormal; Notable for the following components:   Troponin I 0.03 (*)    All other components within normal limits  BRAIN NATRIURETIC PEPTIDE   ____________________________________________  EKG  Time: 7:34 PM Rate:110 Rhythm: Sinus tachycardia with premature ventricular contraction Axis: Normal Interval: Normal  ST segment: None ____________________________________________  RADIOLOGY I, Gore N Geraldine Sandberg, personally viewed and evaluated these images (plain radiographs) as part of my medical decision making, as well as reviewing the written report by the radiologist. Dg Chest 2 View  Result Date: 04/22/2017 CLINICAL DATA:  Shortness of breath over the last 3-4 weeks. EXAM: CHEST  2 VIEW COMPARISON:  07/10/2016 FINDINGS: The heart is enlarged. There is aortic atherosclerosis. There is a background pattern of abnormal interstitial lung markings, which appear more prominent today. There are small bilateral effusions. The findings are most consistent with fluid overload/congestive heart failure. Coexistent pneumonia not excluded. No acute bone finding. IMPRESSION: Probable congestive heart failure with interstitial edema and small effusions, superimposed upon chronic lung disease. Cannot rule out the possibility of coexistent pneumonia. Electronically Signed   By: Nelson Chimes M.D.   On: 04/22/2017 20:08     Procedures   ____________________________________________   INITIAL IMPRESSION / ASSESSMENT AND PLAN / ED COURSE  As part  of my medical decision making, I reviewed the following data within the electronic MEDICAL RECORD [id number] year old male presented with above-stated history and  physical exam concerning for possible pulmonary edema secondary to CHF versus infectious etiology versus COPD exacerbation.  I suspect CHF to be the most likely etiology of the patient's symptoms given bilateral lower extremity swelling orthopnea and chest x-ray and CT scan findings of interstitial edema in conjunction with a BNP of 1528.  Patient given DuoNeb as well as 40 mg of Lasix in the emergency department with symptomatic improvement.  Patient discussed with Dr. Jannifer Franklin for hospital admission for further evaluation and management.     ____________________________________________  FINAL CLINICAL IMPRESSION(S) / ED DIAGNOSES  Final diagnoses:  Acute on chronic congestive heart failure, unspecified heart failure type ([place])     MEDICATIONS GIVEN DURING THIS VISIT:  Medications  ipratropium-albuterol (DUONEB) 0.5-2.5 (3) MG/3ML nebulizer solution 3 mL (not administered)     ED Discharge Orders    None       Note:  This document was prepared using Dragon voice recognition software and may include unintentional dictation errors.    Gregor Hams, MD 04/23/17 [phone]

## 2017-04-23 ENCOUNTER — Encounter: Payer: Self-pay | Admitting: Internal Medicine

## 2017-04-23 ENCOUNTER — Other Ambulatory Visit: Payer: Self-pay

## 2017-04-23 DIAGNOSIS — E1151 Type 2 diabetes mellitus with diabetic peripheral angiopathy without gangrene: Secondary | ICD-10-CM | POA: Diagnosis present

## 2017-04-23 DIAGNOSIS — I5033 Acute on chronic diastolic (congestive) heart failure: Secondary | ICD-10-CM

## 2017-04-23 DIAGNOSIS — G4730 Sleep apnea, unspecified: Secondary | ICD-10-CM | POA: Diagnosis present

## 2017-04-23 DIAGNOSIS — G629 Polyneuropathy, unspecified: Secondary | ICD-10-CM | POA: Diagnosis present

## 2017-04-23 DIAGNOSIS — I509 Heart failure, unspecified: Secondary | ICD-10-CM | POA: Diagnosis present

## 2017-04-23 DIAGNOSIS — J181 Lobar pneumonia, unspecified organism: Secondary | ICD-10-CM

## 2017-04-23 DIAGNOSIS — I25118 Atherosclerotic heart disease of native coronary artery with other forms of angina pectoris: Secondary | ICD-10-CM

## 2017-04-23 DIAGNOSIS — E785 Hyperlipidemia, unspecified: Secondary | ICD-10-CM | POA: Diagnosis present

## 2017-04-23 DIAGNOSIS — J9601 Acute respiratory failure with hypoxia: Secondary | ICD-10-CM | POA: Diagnosis present

## 2017-04-23 DIAGNOSIS — J209 Acute bronchitis, unspecified: Secondary | ICD-10-CM | POA: Diagnosis present

## 2017-04-23 DIAGNOSIS — I5022 Chronic systolic (congestive) heart failure: Secondary | ICD-10-CM

## 2017-04-23 DIAGNOSIS — E11621 Type 2 diabetes mellitus with foot ulcer: Secondary | ICD-10-CM | POA: Diagnosis present

## 2017-04-23 DIAGNOSIS — L97509 Non-pressure chronic ulcer of other part of unspecified foot with unspecified severity: Secondary | ICD-10-CM | POA: Diagnosis present

## 2017-04-23 DIAGNOSIS — J440 Chronic obstructive pulmonary disease with acute lower respiratory infection: Secondary | ICD-10-CM | POA: Diagnosis present

## 2017-04-23 DIAGNOSIS — E1140 Type 2 diabetes mellitus with diabetic neuropathy, unspecified: Secondary | ICD-10-CM | POA: Diagnosis present

## 2017-04-23 DIAGNOSIS — I5043 Acute on chronic combined systolic (congestive) and diastolic (congestive) heart failure: Secondary | ICD-10-CM | POA: Diagnosis present

## 2017-04-23 DIAGNOSIS — M1990 Unspecified osteoarthritis, unspecified site: Secondary | ICD-10-CM | POA: Diagnosis present

## 2017-04-23 DIAGNOSIS — R0603 Acute respiratory distress: Secondary | ICD-10-CM

## 2017-04-23 DIAGNOSIS — F17210 Nicotine dependence, cigarettes, uncomplicated: Secondary | ICD-10-CM | POA: Diagnosis present

## 2017-04-23 DIAGNOSIS — F419 Anxiety disorder, unspecified: Secondary | ICD-10-CM | POA: Diagnosis present

## 2017-04-23 DIAGNOSIS — I444 Left anterior fascicular block: Secondary | ICD-10-CM | POA: Diagnosis present

## 2017-04-23 DIAGNOSIS — E875 Hyperkalemia: Secondary | ICD-10-CM | POA: Diagnosis present

## 2017-04-23 DIAGNOSIS — I700 Atherosclerosis of aorta: Secondary | ICD-10-CM | POA: Diagnosis present

## 2017-04-23 DIAGNOSIS — I739 Peripheral vascular disease, unspecified: Secondary | ICD-10-CM

## 2017-04-23 DIAGNOSIS — J189 Pneumonia, unspecified organism: Secondary | ICD-10-CM | POA: Diagnosis present

## 2017-04-23 DIAGNOSIS — I493 Ventricular premature depolarization: Secondary | ICD-10-CM | POA: Diagnosis present

## 2017-04-23 DIAGNOSIS — J441 Chronic obstructive pulmonary disease with (acute) exacerbation: Secondary | ICD-10-CM | POA: Diagnosis present

## 2017-04-23 DIAGNOSIS — I110 Hypertensive heart disease with heart failure: Secondary | ICD-10-CM | POA: Diagnosis not present

## 2017-04-23 DIAGNOSIS — I429 Cardiomyopathy, unspecified: Secondary | ICD-10-CM | POA: Diagnosis present

## 2017-04-23 LAB — CBC
HCT: 37.4 % — ABNORMAL LOW (ref 40.0–52.0)
Hemoglobin: 12.2 g/dL — ABNORMAL LOW (ref 13.0–18.0)
MCH: 24.2 pg — ABNORMAL LOW (ref 26.0–34.0)
MCHC: 32.6 g/dL (ref 32.0–36.0)
MCV: 74.1 fL — ABNORMAL LOW (ref 80.0–100.0)
Platelets: 269 10*3/uL (ref 150–440)
RBC: 5.05 MIL/uL (ref 4.40–5.90)
RDW: 18.8 % — ABNORMAL HIGH (ref 11.5–14.5)
WBC: 7.6 10*3/uL (ref 3.8–10.6)

## 2017-04-23 LAB — CREATININE, SERUM
Creatinine, Ser: 0.81 mg/dL (ref 0.61–1.24)
GFR calc Af Amer: >60 mL/min (ref >60)
GFR calc non Af Amer: >60 mL/min (ref >60)

## 2017-04-23 LAB — TROPONIN I
Troponin I: 0.04 ng/mL (ref <0.03)
Troponin I: 0.03 ng/mL (ref <0.03)
Troponin I: 0.03 ng/mL (ref <0.03)

## 2017-04-23 LAB — GLUCOSE, CAPILLARY
Glucose-Capillary: 152 mg/dL — ABNORMAL HIGH (ref 65–99)
Glucose-Capillary: 196 mg/dL — ABNORMAL HIGH (ref 65–99)
Glucose-Capillary: 81 mg/dL (ref 65–99)
Glucose-Capillary: 68 mg/dL (ref 65–99)
Glucose-Capillary: 112 mg/dL — ABNORMAL HIGH (ref 65–99)
Glucose-Capillary: 111 mg/dL — ABNORMAL HIGH (ref 65–99)

## 2017-04-23 LAB — LIPID PANEL
Cholesterol: 147 mg/dL (ref 0–200)
HDL: 23 mg/dL — ABNORMAL LOW (ref >40)
LDL Cholesterol: 107 mg/dL — ABNORMAL HIGH (ref 0–99)
Total CHOL/HDL Ratio: 6.4 RATIO
Triglycerides: 87 mg/dL (ref <150)
VLDL: 17 mg/dL (ref 0–40)

## 2017-04-23 LAB — HEMOGLOBIN A1C
Hgb A1c MFr Bld: 8.7 % — ABNORMAL HIGH (ref 4.8–5.6)
Mean Plasma Glucose: 202.99 mg/dL

## 2017-04-23 LAB — BRAIN NATRIURETIC PEPTIDE: B Natriuretic Peptide: 1528 pg/mL — ABNORMAL HIGH (ref 0.0–100.0)

## 2017-04-23 LAB — MRSA PCR SCREENING: MRSA by PCR: NEGATIVE

## 2017-04-23 MED ORDER — CARVEDILOL 3.125 MG PO TABS
3.1250 mg | ORAL_TABLET | Freq: Two times a day (BID) | ORAL | Status: DC
  Administered 2017-04-23 – 2017-04-29 (×11): 3.125 mg via ORAL
  Filled 2017-04-23 (×11): qty 1

## 2017-04-23 MED ORDER — INSULIN GLARGINE 100 UNITS/ML SOLOSTAR PEN: 80.0000 [IU] | PEN_INJECTOR | Freq: Every day | SUBCUTANEOUS | Status: DC

## 2017-04-23 MED ORDER — ISOSORBIDE MONONITRATE ER 30 MG PO TB24
30.0000 mg | ORAL_TABLET | Freq: Every day | ORAL | Status: DC
  Administered 2017-04-24 – 2017-04-29 (×5): 30 mg via ORAL
  Filled 2017-04-23 (×7): qty 1

## 2017-04-23 MED ORDER — ALBUTEROL SULFATE (2.5 MG/3ML) 0.083% IN NEBU: 3.0000 mL | INHALATION_SOLUTION | Freq: Four times a day (QID) | RESPIRATORY_TRACT | Status: DC | PRN

## 2017-04-23 MED ORDER — FUROSEMIDE 10 MG/ML IJ SOLN
40.0000 mg | Freq: Two times a day (BID) | INTRAMUSCULAR | Status: DC
  Administered 2017-04-23 – 2017-04-25 (×5): 40 mg via INTRAVENOUS
  Filled 2017-04-23 (×5): qty 4

## 2017-04-23 MED ORDER — FLUTICASONE FUROATE-VILANTEROL 100-25 MCG/INH IN AEPB: 1.0000 | INHALATION_SPRAY | Freq: Every day | RESPIRATORY_TRACT | Status: DC

## 2017-04-23 MED ORDER — ATORVASTATIN CALCIUM 20 MG PO TABS
40.0000 mg | ORAL_TABLET | Freq: Every day | ORAL | Status: DC
  Administered 2017-04-23 – 2017-04-28 (×6): 40 mg via ORAL
  Filled 2017-04-23 (×6): qty 2

## 2017-04-23 MED ORDER — OXYCODONE HCL 5 MG PO TABS
5.0000 mg | ORAL_TABLET | Freq: Four times a day (QID) | ORAL | Status: DC | PRN
  Administered 2017-04-23 – 2017-04-27 (×12): 5 mg via ORAL
  Filled 2017-04-23 (×12): qty 1

## 2017-04-23 MED ORDER — ENOXAPARIN SODIUM 40 MG/0.4ML ~~LOC~~ SOLN
40.0000 mg | SUBCUTANEOUS | Status: DC
  Administered 2017-04-23 – 2017-04-28 (×6): 40 mg via SUBCUTANEOUS
  Filled 2017-04-23 (×6): qty 0.4

## 2017-04-23 MED ORDER — ASPIRIN EC 81 MG PO TBEC
81.0000 mg | DELAYED_RELEASE_TABLET | Freq: Every day | ORAL | Status: DC
  Administered 2017-04-23 – 2017-04-29 (×7): 81 mg via ORAL
  Filled 2017-04-23 (×7): qty 1

## 2017-04-23 MED ORDER — SODIUM CHLORIDE 0.9 % IV SOLN: 250.0000 mL | INTRAVENOUS | Status: DC | PRN

## 2017-04-23 MED ORDER — CLONAZEPAM 1 MG PO TABS
1.0000 mg | ORAL_TABLET | Freq: Every day | ORAL | Status: DC
  Administered 2017-04-23 – 2017-04-28 (×6): 1 mg via ORAL
  Filled 2017-04-23 (×6): qty 1

## 2017-04-23 MED ORDER — INSULIN ASPART 100 UNIT/ML ~~LOC~~ SOLN
0.0000 [IU] | Freq: Three times a day (TID) | SUBCUTANEOUS | Status: DC
  Administered 2017-04-23 – 2017-04-26 (×7): 3 [IU] via SUBCUTANEOUS
  Administered 2017-04-26 – 2017-04-27 (×2): 5 [IU] via SUBCUTANEOUS
  Administered 2017-04-27: 3 [IU] via SUBCUTANEOUS
  Administered 2017-04-28: 5 [IU] via SUBCUTANEOUS
  Administered 2017-04-28: 2 [IU] via SUBCUTANEOUS
  Filled 2017-04-23 (×12): qty 1

## 2017-04-23 MED ORDER — NITROGLYCERIN 0.4 MG SL SUBL: 0.4000 mg | SUBLINGUAL_TABLET | SUBLINGUAL | Status: DC | PRN

## 2017-04-23 MED ORDER — MOMETASONE FURO-FORMOTEROL FUM 100-5 MCG/ACT IN AERO
2.0000 | INHALATION_SPRAY | Freq: Two times a day (BID) | RESPIRATORY_TRACT | Status: DC
  Administered 2017-04-23 – 2017-04-28 (×12): 2 via RESPIRATORY_TRACT
  Filled 2017-04-23: qty 8.8

## 2017-04-23 MED ORDER — ACETAMINOPHEN 325 MG PO TABS
650.0000 mg | ORAL_TABLET | ORAL | Status: DC | PRN
  Filled 2017-04-23: qty 2

## 2017-04-23 MED ORDER — SODIUM CHLORIDE 0.9% FLUSH
3.0000 mL | INTRAVENOUS | Status: DC | PRN
  Administered 2017-04-26: 3 mL via INTRAVENOUS
  Filled 2017-04-23: qty 3

## 2017-04-23 MED ORDER — OXYCODONE-ACETAMINOPHEN 10-325 MG PO TABS: 1.0000 | ORAL_TABLET | Freq: Four times a day (QID) | ORAL | Status: DC | PRN

## 2017-04-23 MED ORDER — POTASSIUM CHLORIDE CRYS ER 10 MEQ PO TBCR
10.0000 meq | EXTENDED_RELEASE_TABLET | Freq: Every day | ORAL | Status: DC
  Administered 2017-04-23 – 2017-04-28 (×6): 10 meq via ORAL
  Filled 2017-04-23 (×6): qty 1

## 2017-04-23 MED ORDER — LISINOPRIL 10 MG PO TABS
10.0000 mg | ORAL_TABLET | Freq: Every day | ORAL | Status: DC
  Administered 2017-04-24 – 2017-04-27 (×4): 10 mg via ORAL
  Filled 2017-04-23 (×2): qty 1
  Filled 2017-04-23: qty 2
  Filled 2017-04-23 (×3): qty 1

## 2017-04-23 MED ORDER — INSULIN ASPART 100 UNIT/ML ~~LOC~~ SOLN
0.0000 [IU] | Freq: Every day | SUBCUTANEOUS | Status: DC
  Administered 2017-04-24: 4 [IU] via SUBCUTANEOUS
  Administered 2017-04-26 – 2017-04-27 (×2): 3 [IU] via SUBCUTANEOUS
  Filled 2017-04-23 (×3): qty 1

## 2017-04-23 MED ORDER — CLOPIDOGREL BISULFATE 75 MG PO TABS
75.0000 mg | ORAL_TABLET | Freq: Every day | ORAL | Status: DC
Start: 2017-04-23 — End: 2017-04-29
  Administered 2017-04-23 – 2017-04-29 (×7): 75 mg via ORAL
  Filled 2017-04-23 (×7): qty 1

## 2017-04-23 MED ORDER — ASPIRIN EC 81 MG PO TBEC: 81.0000 mg | DELAYED_RELEASE_TABLET | Freq: Every day | ORAL | Status: DC

## 2017-04-23 MED ORDER — ONDANSETRON HCL 4 MG/2ML IJ SOLN: 4.0000 mg | Freq: Four times a day (QID) | INTRAMUSCULAR | Status: DC | PRN

## 2017-04-23 MED ORDER — OXYCODONE-ACETAMINOPHEN 5-325 MG PO TABS
1.0000 | ORAL_TABLET | Freq: Four times a day (QID) | ORAL | Status: DC | PRN
  Administered 2017-04-23 – 2017-04-28 (×14): 1 via ORAL
  Filled 2017-04-23 (×14): qty 1

## 2017-04-23 MED ORDER — SODIUM CHLORIDE 0.9% FLUSH
3.0000 mL | Freq: Two times a day (BID) | INTRAVENOUS | Status: DC
  Administered 2017-04-23 – 2017-04-28 (×10): 3 mL via INTRAVENOUS

## 2017-04-23 MED ORDER — GABAPENTIN 400 MG PO CAPS
800.0000 mg | ORAL_CAPSULE | Freq: Three times a day (TID) | ORAL | Status: DC
  Administered 2017-04-23 – 2017-04-29 (×19): 800 mg via ORAL
  Filled 2017-04-23 (×19): qty 2

## 2017-04-23 MED ORDER — ACETAMINOPHEN 325 MG PO TABS: 650.0000 mg | ORAL_TABLET | ORAL | Status: DC | PRN

## 2017-04-23 MED ORDER — GLIPIZIDE 10 MG PO TABS
10.0000 mg | ORAL_TABLET | Freq: Every day | ORAL | Status: DC
  Administered 2017-04-23: 10 mg via ORAL
  Filled 2017-04-23: qty 1

## 2017-04-23 MED ORDER — INSULIN GLARGINE 100 UNIT/ML ~~LOC~~ SOLN
80.0000 [IU] | Freq: Every day | SUBCUTANEOUS | Status: DC
Start: 2017-04-23 — End: 2017-04-23
  Administered 2017-04-23: 80 [IU] via SUBCUTANEOUS
  Filled 2017-04-23 (×2): qty 0.8

## 2017-04-23 NOTE — Care Management Note (Signed)
Case Management Note  Patient Details  Name: [name] [name] MRN: [id number] Date of Birth: 1960-06-27  Subjective/Objective:                  Met with patient to discuss discharge planning and transition of care. He is followed by [facility] in [place] under "charity" and he uses Medication management for drug assistance. He states he pays out of pocket for her pain medications. He denies problems obtaining his medications or getting to PCP. He drives; O2 is acute. He lives with his girlfriend and neither of them work. He has a scale to weigh with.   Action/Plan:   Patient denies RNCM needs.   Expected Discharge Date:  04/25/17               Expected Discharge Plan:     In-House Referral:     Discharge planning Services  CM Consult  Post Acute Care Choice:    Choice offered to:  Patient  DME Arranged:    DME Agency:     HH Arranged:    [place] Agency:     Status of Service:  Completed, signed off  If discussed at [employer] of Stay Meetings, dates discussed:    Additional Comments:  Marshell Garfinkel, RN 04/23/2017, 1:48 PM

## 2017-04-23 NOTE — Plan of Care (Signed)
Na

## 2017-04-23 NOTE — Progress Notes (Signed)
[place] [facility] [place] NAME: [name] [name]    MR#:  [id number]  DATE OF BIRTH:  Jun 29, 1960  SUBJECTIVE:   Patient here due to acute respiratory failure with hypoxia secondary to CHF.  Improving with diuresis.  Non-compliant with increasing salt intake and also tobacco abuse. No chest pain.    REVIEW OF SYSTEMS:    Review of Systems  Constitutional: Negative for chills and fever.  HENT: Negative for congestion and tinnitus.   Eyes: Negative for blurred vision and double vision.  Respiratory: Positive for shortness of breath. Negative for cough and wheezing.   Cardiovascular: Negative for chest pain, orthopnea and PND.  Gastrointestinal: Negative for abdominal pain, diarrhea, nausea and vomiting.  Genitourinary: Negative for dysuria and hematuria.  Neurological: Negative for dizziness, sensory change and focal weakness.  All other systems reviewed and are negative.   Nutrition: Heart Healthy/Carb control.  Tolerating Diet: Yes Tolerating PT: Await Eval.   DRUG ALLERGIES:   Allergies  Allergen Reactions  . Dulaglutide Anaphylaxis, Diarrhea and Hives    VITALS:  Blood pressure 94/72, pulse 85, temperature 98.5 F (36.9 C), temperature source Oral, resp. rate (!) 23, height 5\' 6"  (1.676 m), weight 106 kg (233 lb 11 oz), SpO2 98 %.  PHYSICAL EXAMINATION:   Physical Exam  GENERAL:  56 y.o.-year-old obese patient lying in bed in no acute distress.  EYES: Pupils equal, round, reactive to light and accommodation. No scleral icterus. Extraocular muscles intact.  HEENT: Head atraumatic, normocephalic. Oropharynx and nasopharynx clear.  NECK:  Supple, no jugular venous distention. No thyroid enlargement, no tenderness.  LUNGS: Normal breath sounds bilaterally, no wheezing, bibasilar rales, rhonchi. No use of accessory muscles of respiration.  CARDIOVASCULAR: S1, S2 normal. No murmurs, rubs, or gallops.  ABDOMEN: Soft, nontender, nondistended.  Bowel sounds present. No organomegaly or mass.  EXTREMITIES: No cyanosis, clubbing, + 1 edema b/l   NEUROLOGIC: Cranial nerves II through XII are intact. No focal Motor or sensory deficits b/l.   PSYCHIATRIC: The patient is alert and oriented x 3.  SKIN: No obvious rash, lesion, or ulcer.    LABORATORY PANEL:   CBC Recent Labs  Lab 04/23/17 0409  WBC 7.6  HGB 12.2*  HCT 37.4*  PLT 269   ------------------------------------------------------------------------------------------------------------------  Chemistries  Recent Labs  Lab 04/22/17 1936 04/23/17 0409  NA 133*  --   K 4.0  --   CL 97*  --   CO2 27  --   GLUCOSE 291*  --   BUN 11  --   CREATININE 0.71 0.81  CALCIUM 8.7*  --    ------------------------------------------------------------------------------------------------------------------  Cardiac Enzymes Recent Labs  Lab 04/23/17 1002  TROPONINI 0.03*   ------------------------------------------------------------------------------------------------------------------  RADIOLOGY:  Dg Chest 2 View  Result Date: 04/22/2017 CLINICAL DATA:  Shortness of breath over the last 3-4 weeks. EXAM: CHEST  2 VIEW COMPARISON:  07/10/2016 FINDINGS: The heart is enlarged. There is aortic atherosclerosis. There is a background pattern of abnormal interstitial lung markings, which appear more prominent today. There are small bilateral effusions. The findings are most consistent with fluid overload/congestive heart failure. Coexistent pneumonia not excluded. No acute bone finding. IMPRESSION: Probable congestive heart failure with interstitial edema and small effusions, superimposed upon chronic lung disease. Cannot rule out the possibility of coexistent pneumonia. Electronically Signed   By: Nelson Chimes M.D.   On: 04/22/2017 20:08   Ct Chest Wo Contrast  Result Date: 04/23/2017 CLINICAL DATA:  56 year old male with  shortness of breath. History of COPD and pneumonia. EXAM: CT  CHEST WITHOUT CONTRAST TECHNIQUE: Multidetector CT imaging of the chest was performed following the standard protocol without IV contrast. COMPARISON:  Chest radiograph.  04/22/2017 and CT dated 07/10/2016. FINDINGS: Evaluation of this exam is limited in the absence of intravenous contrast. Evaluation is also limited due to respiratory motion artifact. Cardiovascular: There is borderline cardiomegaly. No pericardial effusion. There is advanced coronary vascular calcification with involvement of the left main, LAD, left circumflex artery, and RCA. There is atherosclerotic calcification of the thoracic aorta and aortic arch. Slight prominence of the pulmonary arteries suggestive of underlying pulmonary hypertension. Mediastinum/Nodes: No hilar or mediastinal adenopathy. Multiple small mediastinal lymph nodes noted. A right paratracheal lymph node measures 10 mm in short axis. Evaluation of the hila however is limited in the absence of intravenous contrast. The esophagus is grossly unremarkable. Lungs/Pleura: Mild paraseptal emphysema. There are small bilateral pleural effusions, right greater than left. Minimal bibasilar compressive atelectasis noted. There is no focal consolidation. There is mild cephalization with interstitial prominence consistent with mild pulmonary edema . Superimposed pneumonia is not excluded. Clinical correlation is recommended. The central airways are patent. Upper Abdomen: Suboptimally visualized and evaluated due to respiratory motion artifact. Musculoskeletal: Mild degenerative changes of the spine. No acute osseous pathology. IMPRESSION: 1. Borderline cardiomegaly with findings of CHF and small bilateral pleural effusions. Superimposed pneumonia is not excluded. Clinical correlation is recommended. 2. Advanced multi vessel coronary vascular calcification as well as atherosclerotic disease of the aorta. 3. Aortic Atherosclerosis (ICD10-I70.0) and Emphysema (ICD10-J43.9). Electronically  Signed   By: Anner Crete M.D.   On: 04/23/2017 00:20     ASSESSMENT AND PLAN:   56 year old male with past medical history of diabetes, hypertension, hyperlipidemia, morbid obesity, peripheral vascular disease, chronic diastolic CHF, COPD, BPH who presents to the hospital due to shortness of breath and noted to be in acute respiratory failure with hypoxia secondary to CHF.  1. Acute respiratory failure with hypoxia-secondary to CHF. -Continue O2 supplementation, improving with IV diuresis and will continue to monitor. Wean off oxygen as tolerated.  2. CHF-acute on chronic diastolic dysfunction. Await echocardiogram results.  -continue diuresis with IV Lasix, follow I's and O's and daily weights. Continue carvedilol, lisinopril. -Appreciate cardiology input  3. COPD-no acute exacerbation. Continue Dulera, DuoNeb nebs as needed.  4. Diabetes type 2 without complication-continue Lantus, sliding scale insulin. Follow blood sugars.  5. Hyperlipidemia-continue atorvastatin.  6. Anxiety-continue Klonopin.  7. Neuropathy-continue gabapentin.    All the records are reviewed and case discussed with Care Management/Social Worker. Management plans discussed with the patient, family and they are in agreement.  CODE STATUS: Full code  DVT Prophylaxis: Lovenox  TOTAL TIME TAKING CARE OF THIS PATIENT: 30 minutes.   POSSIBLE D/C IN 2-3 DAYS, DEPENDING ON CLINICAL CONDITION.   Henreitta Leber M.D on 04/23/2017 at 2:52 PM  Between 7am to 6pm - Pager - [phone]  After 6pm go to [email or web address] - [employer] [facility]  Office  [phone]  CC: Primary care physician; Clarisse Gouge, MD

## 2017-04-23 NOTE — H&P (Signed)
[facility] NAME: [name] [name]    MR#:  [id number]  DATE OF BIRTH:  [birth year]  DATE OF ADMISSION:  04/22/2017  PRIMARY CARE PHYSICIAN: Clarisse Gouge, MD   REQUESTING/REFERRING PHYSICIAN:   CHIEF COMPLAINT:   Chief Complaint  Patient presents with  . Shortness of Breath    HISTORY OF PRESENT ILLNESS: [name] [name]  is a 56 y.o. male with a known history of diastolic heart failure with EF of 50%, COPD, coronary artery disease, hyperlipidemia, diabetes mellitus, hypertension, neuropathy, peripheral arterial disease, sleep apnea presented to the emergency room with increased shortness of breath. Patient has orthopnea and progressive dyspnea and cough. Patient also complains of bilateral lower extremity swelling and fluid retention. Says he has been compliant with his medications. Patient was given IV Lasix in the emergency room to start of the diuresis. No complaints of any chest pain. Patient was worked up with CT chest which showed bilateral pleural effusions and congestive heart failure changes. Hospitalist service was consulted.  PAST MEDICAL HISTORY:   Past Medical History:  Diagnosis Date  . Arthritis   . Asthma   . Atherosclerosis   . BPH (benign prostatic hyperplasia)   . Chronic diastolic CHF (congestive heart failure) ([place])    a. 02/2013 EF 50% by LV gram.  . COPD (chronic obstructive pulmonary disease) ([place])   . Coronary artery disease    a. Status post LAD PCI in 2013 at [facility];  b. October 2014 - Nuclear stress test showed inferior wall defect->Cath: patent LAD stent with 90% stenosis in the proximal RCA ( 2.5 x 23 mm DES), EF 50%;  c. 02/2014 MV w/ large inferior scar->Cath: LAD 53m, LCX 47m, 20d, RCA 20p/m, patent stent->Med Rx.  . Diabetes mellitus without complication ([place])   . Hernia   . Hyperlipidemia   . Hypertension   . Morbid obesity ([place])   . Neuropathy   . PAD (peripheral artery disease) ([place])    a.  Angiography in March of 2012 showed 70-80% proximal right SFA stenosis status post self-expanding stent placement (6X60 mm). Angiography in April 2012 showed proximal left SFA occlusion status post self-expanding stent placement (7 X unkonw length). Both done by Dr. Lucky Cowboy.  . Peripheral vascular disease ([place])   . Sleep apnea   . Syncope and collapse   . Tobacco use    a. 75+ yr hx - still smoking 1ppd, down from 2 ppd.  . Varicose vein     PAST SURGICAL HISTORY:  Past Surgical History:  Procedure Laterality Date  . APPENDECTOMY    . CARDIAC CATHETERIZATION  10/14   [facility]; X1 STENT PROXIMAL RCA  . CARDIAC CATHETERIZATION  02/2010   [facility]  . CARDIAC CATHETERIZATION  02/21/2014   [facility]  . CLAVICLE SURGERY    . HERNIA REPAIR    . PERIPHERAL ARTERIAL STENT GRAFT     x2 left/right    SOCIAL HISTORY:  Social History   Tobacco Use  . Smoking status: Current Every Day Smoker    Packs/day: 0.50    Years: 41.00    Pack years: 20.50    Types: Cigarettes  . Smokeless tobacco: Never Used  Substance Use Topics  . Alcohol use: No    FAMILY HISTORY:  Family History  Problem Relation Age of Onset  . Heart attack Father   . Heart disease Father   . Hypertension Father   . Hyperlipidemia Father   . Hypertension Mother   .  Hyperlipidemia Mother     DRUG ALLERGIES:  Allergies  Allergen Reactions  . Dulaglutide Anaphylaxis, Diarrhea and Hives    REVIEW OF SYSTEMS:   CONSTITUTIONAL: No fever, fatigue or weakness.  EYES: No blurred or double vision.  EARS, NOSE, AND THROAT: No tinnitus or ear pain.  RESPIRATORY: Has cough, shortness of breath, No  wheezing or hemoptysis.  CARDIOVASCULAR: No chest pain,  Has orthopnea, edema.  GASTROINTESTINAL: No nausea, vomiting, diarrhea or abdominal pain.  GENITOURINARY: No dysuria, hematuria.  ENDOCRINE: No polyuria, nocturia,  HEMATOLOGY: No anemia, easy bruising or bleeding SKIN: No rash or lesion. MUSCULOSKELETAL: No joint pain or  arthritis.   Has edema. NEUROLOGIC: No tingling, numbness, weakness.  PSYCHIATRY: No anxiety or depression.   MEDICATIONS AT HOME:  Prior to Admission medications   Medication Sig Start Date End Date Taking? Authorizing Provider  albuterol (PROVENTIL HFA;VENTOLIN HFA) 108 (90 BASE) MCG/ACT inhaler Inhale 2 puffs into the lungs every 6 (six) hours as needed for wheezing or shortness of breath. 04/15/13  Yes Wellington Hampshire, MD  aspirin EC 81 MG tablet Take 1 tablet (81 mg total) by mouth daily. 05/11/15  Yes Wellington Hampshire, MD  atorvastatin (LIPITOR) 40 MG tablet Take 1 tablet (40 mg total) by mouth daily at 6 PM. 07/19/16 04/23/17 Yes Wellington Hampshire, MD  budesonide-formoterol (SYMBICORT) 80-4.5 MCG/ACT inhaler Inhale 2 puffs as needed into the lungs.    Yes [provider]  clonazePAM (KLONOPIN) 1 MG tablet Take 1 mg by mouth at bedtime.   Yes [provider]  clopidogrel (PLAVIX) 75 MG tablet Take 1 tablet (75 mg total) by mouth daily. 07/06/15  Yes Dew, Erskine Squibb, MD  fluticasone furoate-vilanterol (BREO ELLIPTA) 100-25 MCG/INH AEPB Inhale 1 puff daily into the lungs. 08/15/16  Yes [provider]  furosemide (LASIX) 20 MG tablet Take 1 tablet (20 mg total) by mouth daily. 07/18/16 04/23/17 Yes Wende Bushy, MD  gabapentin (NEURONTIN) 800 MG tablet Take 800 mg by mouth 3 (three) times daily.   Yes [provider]  glipiZIDE (GLUCOTROL) 10 MG tablet Take 10 mg by mouth daily before breakfast.   Yes [provider]  insulin glargine (LANTUS) 100 unit/mL SOPN Inject 0.8 mLs (80 Units total) into the skin daily. 05/07/16 04/23/17 Yes Mody, Ulice Bold, MD  insulin lispro (HUMALOG) 100 UNIT/ML injection Inject 0-10 Units into the skin 3 (three) times daily with meals. If BS is 0 to 70 no insulin and eat/glucose tablets/glucose get If BS 71 to 150 great! If BS 151 to 200, 2 Units Humaog If BS 201 to 250, 4 Units Humalog If BS 251 to 300, 6 Units Humalog If BS  301 to 350, 8 Units Humalog If BS 351 to 400, 10 Units Humalog If BS greater than 400, take 10 Units and Contact our office during office hour   Yes [provider]  nitroGLYCERIN (NITROSTAT) 0.4 MG SL tablet Place 0.4 mg under the tongue every 5 (five) minutes as needed for chest pain.   Yes [provider]  oxyCODONE-acetaminophen (PERCOCET) 10-325 MG tablet Take by mouth. 01/21/17 05/24/17 Yes [provider]  potassium chloride (K-DUR) 10 MEQ tablet Take 1 tablet (10 mEq total) by mouth daily. 07/18/16 04/23/17 Yes Wende Bushy, MD  amoxicillin-clavulanate (AUGMENTIN) 875-125 MG tablet Take 1 tablet by mouth 2 (two) times daily. Patient not taking: Reported on 04/23/2017 05/07/16   Bettey Costa, MD  collagenase (SANTYL) ointment Apply topically daily. Patient not taking: Reported on  04/23/2017 05/08/16   Bettey Costa, MD  isosorbide mononitrate (IMDUR) 30 MG 24 hr tablet Take 1 tablet (30 mg total) by mouth daily. 07/11/16   Demetrios Loll, MD  metFORMIN (GLUCOPHAGE) 1000 MG tablet Take 1,000 mg by mouth 2 (two) times daily with a meal.    [provider]      PHYSICAL EXAMINATION:   VITAL SIGNS: Blood pressure 120/85, pulse 91, temperature 98.7 F (37.1 C), temperature source Oral, resp. rate 18, height 5\' 7"  (1.702 m), weight 108.9 kg (240 lb), SpO2 96 %.  GENERAL:  56 y.o.-year-old patient lying in the bed with no acute distress.  EYES: Pupils equal, round, reactive to light and accommodation. No scleral icterus. Extraocular muscles intact.  HEENT: Head atraumatic, normocephalic. Oropharynx and nasopharynx clear.  NECK:  Supple, no jugular venous distention. No thyroid enlargement, no tenderness.  LUNGS: Decreased breath sounds bilaterally, bilateral crepitations heard. No use of accessory muscles of respiration.  CARDIOVASCULAR: S1, S2 normal. No murmurs, rubs, or gallops.  ABDOMEN: Soft, nontender, nondistended. Bowel sounds present. No organomegaly or  mass.  EXTREMITIES: Has pedal edema,  No cyanosis, or clubbing.  NEUROLOGIC: Cranial nerves II through XII are intact. Muscle strength 5/5 in all extremities. Sensation intact. Gait not checked.  PSYCHIATRIC: The patient is alert and oriented x 3.  SKIN: No obvious rash, lesion, or ulcer.   LABORATORY PANEL:   CBC Recent Labs  Lab 04/22/17 1936  WBC 10.2  HGB 12.6*  HCT 39.2*  PLT 270  MCV 74.7*  MCH 24.0*  MCHC 32.2  RDW 18.6*   ------------------------------------------------------------------------------------------------------------------  Chemistries  Recent Labs  Lab 04/22/17 1936  NA 133*  K 4.0  CL 97*  CO2 27  GLUCOSE 291*  BUN 11  CREATININE 0.71  CALCIUM 8.7*   ------------------------------------------------------------------------------------------------------------------ estimated creatinine clearance is 124.2 mL/min (by C-G formula based on SCr of 0.71 mg/dL). ------------------------------------------------------------------------------------------------------------------ No results for input(s): TSH, T4TOTAL, T3FREE, THYROIDAB in the last 72 hours.  Invalid input(s): FREET3   Coagulation profile No results for input(s): INR, PROTIME in the last 168 hours. ------------------------------------------------------------------------------------------------------------------- No results for input(s): DDIMER in the last 72 hours. -------------------------------------------------------------------------------------------------------------------  Cardiac Enzymes Recent Labs  Lab 04/22/17 1936  TROPONINI 0.03*   ------------------------------------------------------------------------------------------------------------------ Invalid input(s): POCBNP  ---------------------------------------------------------------------------------------------------------------  Urinalysis    Component Value Date/Time   COLORURINE YELLOW (A) 11/21/2015 1424    APPEARANCEUR CLEAR (A) 11/21/2015 1424   APPEARANCEUR Clear 02/06/2014 1412   LABSPEC 1.036 (H) 11/21/2015 1424   LABSPEC 1.015 02/06/2014 1412   PHURINE 6.0 11/21/2015 1424   GLUCOSEU >500 (A) 11/21/2015 1424   GLUCOSEU Negative 02/06/2014 1412   HGBUR 1+ (A) 11/21/2015 1424   BILIRUBINUR NEGATIVE 11/21/2015 1424   BILIRUBINUR Negative 02/06/2014 1412   KETONESUR NEGATIVE 11/21/2015 1424   PROTEINUR 30 (A) 11/21/2015 1424   NITRITE NEGATIVE 11/21/2015 1424   LEUKOCYTESUR NEGATIVE 11/21/2015 1424   LEUKOCYTESUR Negative 02/06/2014 1412     RADIOLOGY: Dg Chest 2 View  Result Date: 04/22/2017 CLINICAL DATA:  Shortness of breath over the last 3-4 weeks. EXAM: CHEST  2 VIEW COMPARISON:  07/10/2016 FINDINGS: The heart is enlarged. There is aortic atherosclerosis. There is a background pattern of abnormal interstitial lung markings, which appear more prominent today. There are small bilateral effusions. The findings are most consistent with fluid overload/congestive heart failure. Coexistent pneumonia not excluded. No acute bone finding. IMPRESSION: Probable congestive heart failure with interstitial edema and small effusions, superimposed upon chronic lung disease. Cannot rule out the  possibility of coexistent pneumonia. Electronically Signed   By: Nelson Chimes M.D.   On: 04/22/2017 20:08   Ct Chest Wo Contrast  Result Date: 04/23/2017 CLINICAL DATA:  56 year old male with shortness of breath. History of COPD and pneumonia. EXAM: CT CHEST WITHOUT CONTRAST TECHNIQUE: Multidetector CT imaging of the chest was performed following the standard protocol without IV contrast. COMPARISON:  Chest radiograph.  04/22/2017 and CT dated 07/10/2016. FINDINGS: Evaluation of this exam is limited in the absence of intravenous contrast. Evaluation is also limited due to respiratory motion artifact. Cardiovascular: There is borderline cardiomegaly. No pericardial effusion. There is advanced coronary vascular  calcification with involvement of the left main, LAD, left circumflex artery, and RCA. There is atherosclerotic calcification of the thoracic aorta and aortic arch. Slight prominence of the pulmonary arteries suggestive of underlying pulmonary hypertension. Mediastinum/Nodes: No hilar or mediastinal adenopathy. Multiple small mediastinal lymph nodes noted. A right paratracheal lymph node measures 10 mm in short axis. Evaluation of the hila however is limited in the absence of intravenous contrast. The esophagus is grossly unremarkable. Lungs/Pleura: Mild paraseptal emphysema. There are small bilateral pleural effusions, right greater than left. Minimal bibasilar compressive atelectasis noted. There is no focal consolidation. There is mild cephalization with interstitial prominence consistent with mild pulmonary edema . Superimposed pneumonia is not excluded. Clinical correlation is recommended. The central airways are patent. Upper Abdomen: Suboptimally visualized and evaluated due to respiratory motion artifact. Musculoskeletal: Mild degenerative changes of the spine. No acute osseous pathology. IMPRESSION: 1. Borderline cardiomegaly with findings of CHF and small bilateral pleural effusions. Superimposed pneumonia is not excluded. Clinical correlation is recommended. 2. Advanced multi vessel coronary vascular calcification as well as atherosclerotic disease of the aorta. 3. Aortic Atherosclerosis (ICD10-I70.0) and Emphysema (ICD10-J43.9). Electronically Signed   By: Anner Crete M.D.   On: 04/23/2017 00:20    EKG: Orders placed or performed during the hospital encounter of 04/22/17  . EKG 12-Lead  . EKG 12-Lead  . ED EKG within 10 minutes  . ED EKG within 10 minutes    IMPRESSION AND PLAN: 56 year old the male patient with history of diastolic heart failure, sleep apnea, COPD, hypertension, hyperlipidemia, peripheral arterial disease presented to the emergency room with increased shortness of  breath and swelling of the legs secondary to fluid retention.  Admitting diagnosis 1. Acute decompensated heart failure 2. Respiratory distress secondary to heart failure exacerbation 3. Emphysema 4. Hypertension 5. Hyperlipidemia Treatment plan Admit patient to telemetry inpatient service Diurese patient with IV Lasix Oxygen via nasal cannula Check echocardiogram Resume cardiac medications Cardiology consultation Cycle troponin to rule out ischemia  All the records are reviewed and case discussed with ED provider. Management plans discussed with the patient, family and they are in agreement.  CODE STATUS: Full code Code Status History    Date Active Date Inactive Code Status Order ID Comments User Context   07/11/2016 03:24 07/11/2016 18:51 Full Code [id number]  Saundra Shelling, MD Inpatient   05/06/2016 00:05 05/07/2016 14:17 Full Code [id number]  Harvie Bridge, DO Inpatient   11/21/2015 17:16 11/22/2015 19:50 Full Code [id number]  Gladstone Lighter, MD Inpatient   07/06/2015 09:48 07/06/2015 14:37 Full Code [id number]  Algernon Huxley, MD Inpatient       TOTAL TIME TAKING CARE OF THIS PATIENT: 54 minutes.    Saundra Shelling M.D on 04/23/2017 at 2:46 AM  Between 7am to 6pm - Pager - [phone]  After 6pm go to [email or web address] - password [email or web address] Hospitalists  Office  [phone]  CC: Primary care physician; Clarisse Gouge, MD

## 2017-04-23 NOTE — Progress Notes (Signed)
Pt has been mostly on room air this shift, requests 2 liters at times, O2 Sats WDL.  BP soft, held 1000 AM BP meds, ok per Dr Verdell Carmine.  Frequesnt PACs.   Patient consistently resists any attempts to educate him re his sodium and simple carbohydrate intake.  Refuses to drink diet soda, states it makes him feel sick.  Writer attempted to discuss his soda and juice intake with him, he stated he does not drink "nearly as much as I used to, because I used to drink [employer] all day and I do better now because I drink Coke."  Patient also states he cannot tolerate his blood sugar being in the eighties, that is too low for him and he needs a snack at these times, requests cookies and juice.  Also states he will not use Mrs Deliah Boston, only likes salt and pepper, and will not discuss his home sodium intake. Echo is pending at this time,  Clydene Pugh has visited him and changed his right plantar dressing, writer observed wound, which is somewhat deep but very clean, see assessment flowsheet for details.

## 2017-04-23 NOTE — Consult Note (Signed)
[place] Nurse wound consult note Reason for Consult: Right plantar foot wound. Started as trauma, chronic nonhealing x 18 months.  Wound type:trauma/neuropathic Pressure Injury POA: N/A Measurement: Right plantar foot, near lateral aspect Wound JXF:FKVQ pink nongranulating.  GOes to [facility] for debridement, last seen 3 days ago.  Drainage (amount, consistency, odor) Moderate creamy effluent noted today.  Once cleansed, no drainage noted.  Periwound:intact.  Recently pared at [facility].  Dressing procedure/placement/frequency: Cleanse wound to right plantar foot with NS.  Apply Aquacel AG to wound bed for antimicrobial protection and absorption.  Cover with 4x4 gauze and secure with kerlix and tape.  Change Mon/Wed/Fri.  Will not follow at this time.  Please re-consult if needed.  Domenic Moras RN BSN [place] Pager [phone]

## 2017-04-23 NOTE — Progress Notes (Signed)
Initial Nutrition Assessment  DOCUMENTATION CODES:   Obesity unspecified  INTERVENTION:  Encouraged ongoing adequate intake at meals, especially adequate intake of protein. Reviewed menu with patient at his request.  Patient would benefit from diet education, but he is refusing at this time. He reports he does not understand how eating too much salt can cause fluid to build up.   No further nutrition interventions warranted.  NUTRITION DIAGNOSIS:   Inadequate oral intake related to decreased appetite as evidenced by per patient/family report.  GOAL:   Patient will meet greater than or equal to 90% of their needs  MONITOR:   PO intake, Labs, Weight trends, Skin, I & O's  REASON FOR ASSESSMENT:   Malnutrition Screening Tool    ASSESSMENT:   56 year old male with PMHx of PVD, DM type 2, chronic diastolic CHF, asthma, hernia, PAD, CAD, HLD, HTN, COPD, BPH who presented with increased shortness of breath and bilateral lower extremity edema found to have bilateral pleural effusions, acute respiratory distress secondary to heart failure exacerbation.   Met with patient at bedside. He reports he has had a decreased appetite for the past 4 weeks due to difficulty breathing and taste changes. He is still eating 2-3 meals per day and snacks, but reports he does not finish as much. For example when he gets a burger he may finish <50%. He typically eats cheese burgers, hamburgers, and hotdogs. He reports his breathing is getting better here and his appetite is improving. He ate almost all of his breakfast this morning. Patient denies any N/V, abdominal pain, constipation/diarrhea, or difficulty chewing/swallowing.  UBW 240 lbs. He reports he lost 7 lbs (2.9% body weight) over the past month, which is not significant for time frame.  Meal Completion: 100%  Medications reviewed and include: Lasix 40 mg BID, glipizide 10 mg daily, Novolog 0-15 units TID, Novolog 0-5 units QHS, Lantus 80 units  daily, potassium chloride 10 mEq daily.  Labs reviewed: CBG 152-196, Sodium 133, Chloride 97, BNP 1528, elevated Troponin.  Patient does not meet criteria for malnutrition at this time.  Discussed with RN.  NUTRITION - FOCUSED PHYSICAL EXAM:    Most Recent Value  Orbital Region  No depletion  Upper Arm Region  No depletion  Thoracic and Lumbar Region  No depletion  Buccal Region  No depletion  Temple Region  No depletion  Clavicle Bone Region  No depletion  Clavicle and Acromion Bone Region  No depletion  Scapular Bone Region  No depletion  Dorsal Hand  No depletion  Patellar Region  No depletion  Anterior Thigh Region  No depletion  Posterior Calf Region  No depletion  Edema (RD Assessment)  Mild [to bilateral lower extremities]  Hair  Reviewed  Eyes  Reviewed  Mouth  Reviewed  Skin  Reviewed  Nails  Reviewed     Diet Order:  Diet heart healthy/carb modified Room service appropriate? Yes; Fluid consistency: Thin  EDUCATION NEEDS:   Not appropriate for education at this time(Patient refused education offered by RD.)  Skin:  Skin Assessment: Skin Integrity Issues: Skin Integrity Issues:: Diabetic Ulcer Diabetic Ulcer: right foot  Last BM:  04/22/2017  Height:   Ht Readings from Last 1 Encounters:  04/23/17 _0  (1.676 m)    Weight:   Wt Readings from Last 1 Encounters:  04/23/17 233 lb 11 oz (106 kg)    Ideal Body Weight:  64.5 kg  BMI:  Body mass index is 37.72 kg/m.  Estimated Nutritional  Needs:   Kcal:  2030-2215 (MSJ x 1.1-1.2)  Protein:  85-105 grams (0.8-1 grams/kg)  Fluid:  1.6-2 L/day (25-30 mL/kg IBW)  Willey Blade, MS, RD, LDN Office: [phone] Pager: [phone] After Hours/Weekend Pager: [phone]

## 2017-04-23 NOTE — Progress Notes (Signed)
Pt is Telemetry overflow. PCCM is available as needed  Merton Border, MD PCCM service Mobile [phone] Pager [phone] 04/23/2017 9:33 AM

## 2017-04-23 NOTE — Progress Notes (Signed)
Report called to Tammy on 2A, pt ok to transfer on room air.  VSS, A&O x 4, anti hypertensives held for soft BPs, Dr Verdell Carmine aware, lantus stopped, pt will transfer in wheelchair, chart, and meds to transfer with him.

## 2017-04-23 NOTE — Progress Notes (Signed)
Ok to stop Lantus and pls order diabetes coord consult per Dr Verdell Carmine.  Pt states he has not taken Lantus for > month.  Lunch CGb in 80s and dinner CBG in 60s.  Pt is eating nearly all his meals+ eating snacks

## 2017-04-23 NOTE — Plan of Care (Signed)
Patient admitted to rm icu 13. Patient is alert and oriented. VSS on 2L [place]. Patient has no complaints of pain. Patient placed on cardiac monitor. Updated on plan of care with no further questions at this time. Patient resting in  bed with no signs of acute distress at present. Will continue to monitor.

## 2017-04-23 NOTE — Consult Note (Signed)
Cardiology Consultation:   Patient ID: [name] [name]; [id number]; 03-19-1961   Admit date: 04/22/2017 Date of Consult: 04/23/2017  Primary Care Provider: Clarisse Gouge, MD Primary Cardiologist: Fletcher Anon   Patient Profile:   [name] [name] is a 56 y.o. 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 Estanislado Pandy, MD.  History of Present Illness:   [name] presented to [facility] with 3-4 weeks of increased SOB with associated productive cough of thick brown sputum.  Prior LHC in 2015 showed patent stents with nonobstructive disease. Most recent ischemic evaluation in 2016 via nuclear stress test that showed no evidence of ischemia. Echo in 2016 showed normal LV systolic function with JE5UD. He was admitted in 07/2016 for chest pain and ruled out. He was started on Imdur. At follow up in 07/2016 he was doing well. He has been lost to follow up since.   Over the past 3-4 weeks he has noticed increased SOB with cough productive of brown sputum. He was seeing his PCP who placed him on an antibiotic that he already had at home without much improvement in his breathing. He requests an inhaler which significantly improved his breathing. No chest pain or palpitations. Afebrile. He also noted some LE swelling. No early satiety. He contacted his PCP again for his SOB and was advised to go to the ED.   Upon the patient's arrival to [facility] they were found to have BP 130/89, HR 108 bpm, temp 97.3, oxygen saturation 95% on room air, weight 233 pounds. EKG as below, CXR showed interstitial edema with small pleural effusions. CT chest showed small bilateral pleural effusions with superimposed PNA. Labs showed troponin 0.03-->0.04, BNP 1528, SCr 0.71-->0.81, K+ 4.0, WBC  10.2, HGB 12.6, PLT 270. He has been started on IV Lasix, nebs, and steroids. He reports significant improvement in his breathing since being admitted.   Past Medical History:  Diagnosis Date  . Arthritis   . Asthma   . Atherosclerosis   . BPH (benign prostatic hyperplasia)   . Chronic diastolic CHF (congestive heart failure) ([place])    a. 02/2013 EF 50% by LV gram.  . COPD (chronic obstructive pulmonary disease) ([place])   . Coronary artery disease    a. Status post LAD PCI in 2013 at [facility];  b. October 2014 - Nuclear stress test showed inferior wall defect->Cath: patent LAD stent with 90% stenosis in the proximal RCA ( 2.5 x 23 mm DES), EF 50%;  c. 02/2014 MV w/ large inferior scar->Cath: LAD 64m, LCX 38m, 20d, RCA 20p/m, patent stent->Med Rx.  . Diabetes mellitus without complication ([place])   . Hernia   . Hyperlipidemia   . Hypertension   . Morbid obesity ([place])   . Neuropathy   . PAD (peripheral artery disease) ([place])    a. Angiography in March of 2012 showed 70-80% proximal right SFA stenosis status post self-expanding stent placement (6X60 mm). Angiography in April 2012 showed proximal left SFA occlusion status post self-expanding stent placement (7 X unkonw length). Both done by Dr. Lucky Cowboy.  . Peripheral vascular disease ([place])   . Sleep apnea   . Syncope and collapse   . Tobacco use    a. 75+  yr hx - still smoking 1ppd, down from 2 ppd.  . Varicose vein     Past Surgical History:  Procedure Laterality Date  . APPENDECTOMY    . CARDIAC CATHETERIZATION  10/14   [facility]; X1 STENT PROXIMAL RCA  . CARDIAC CATHETERIZATION  02/2010   [facility]  . CARDIAC CATHETERIZATION  02/21/2014   [facility]  . CLAVICLE SURGERY    . HERNIA REPAIR    . PERIPHERAL ARTERIAL STENT GRAFT     x2 left/right     Home Meds: Prior to Admission medications   Medication Sig Start Date End Date Taking? Authorizing Provider  albuterol (PROVENTIL HFA;VENTOLIN HFA) 108 (90 BASE) MCG/ACT inhaler Inhale 2 puffs into the lungs every  6 (six) hours as needed for wheezing or shortness of breath. 04/15/13  Yes Wellington Hampshire, MD  aspirin EC 81 MG tablet Take 1 tablet (81 mg total) by mouth daily. 05/11/15  Yes Wellington Hampshire, MD  atorvastatin (LIPITOR) 40 MG tablet Take 1 tablet (40 mg total) by mouth daily at 6 PM. 07/19/16 04/23/17 Yes Wellington Hampshire, MD  budesonide-formoterol (SYMBICORT) 80-4.5 MCG/ACT inhaler Inhale 2 puffs as needed into the lungs.    Yes [provider]  clonazePAM (KLONOPIN) 1 MG tablet Take 1 mg by mouth at bedtime.   Yes [provider]  clopidogrel (PLAVIX) 75 MG tablet Take 1 tablet (75 mg total) by mouth daily. 07/06/15  Yes Dew, Erskine Squibb, MD  fluticasone furoate-vilanterol (BREO ELLIPTA) 100-25 MCG/INH AEPB Inhale 1 puff daily into the lungs. 08/15/16  Yes [provider]  furosemide (LASIX) 20 MG tablet Take 1 tablet (20 mg total) by mouth daily. 07/18/16 04/23/17 Yes Wende Bushy, MD  gabapentin (NEURONTIN) 800 MG tablet Take 800 mg by mouth 3 (three) times daily.   Yes [provider]  glipiZIDE (GLUCOTROL) 10 MG tablet Take 10 mg by mouth daily before breakfast.   Yes [provider]  insulin glargine (LANTUS) 100 unit/mL SOPN Inject 0.8 mLs (80 Units total) into the skin daily. 05/07/16 04/23/17 Yes Mody, Ulice Bold, MD  insulin lispro (HUMALOG) 100 UNIT/ML injection Inject 0-10 Units into the skin 3 (three) times daily with meals. If BS is 0 to 70 no insulin and eat/glucose tablets/glucose get If BS 71 to 150 great! If BS 151 to 200, 2 Units Humaog If BS 201 to 250, 4 Units Humalog If BS 251 to 300, 6 Units Humalog If BS 301 to 350, 8 Units Humalog If BS 351 to 400, 10 Units Humalog If BS greater than 400, take 10 Units and Contact our office during office hour   Yes [provider]  nitroGLYCERIN (NITROSTAT) 0.4 MG SL tablet Place 0.4 mg under the tongue every 5 (five) minutes as needed for chest pain.   Yes [provider]    oxyCODONE-acetaminophen (PERCOCET) 10-325 MG tablet Take by mouth. 01/21/17 05/24/17 Yes [provider]  potassium chloride (K-DUR) 10 MEQ tablet Take 1 tablet (10 mEq total) by mouth daily. 07/18/16 04/23/17 Yes Wende Bushy, MD  amoxicillin-clavulanate (AUGMENTIN) 875-125 MG tablet Take 1 tablet by mouth 2 (two) times daily. Patient not taking: Reported on 04/23/2017 05/07/16   Bettey Costa, MD  collagenase (SANTYL) ointment Apply topically daily. Patient not taking: Reported on 04/23/2017 05/08/16   Bettey Costa, MD  isosorbide mononitrate (IMDUR) 30 MG 24 hr tablet Take 1 tablet (30 mg total) by mouth daily. 07/11/16   Demetrios Loll, MD  metFORMIN (GLUCOPHAGE) 1000 MG tablet Take 1,000  mg by mouth 2 (two) times daily with a meal.    [provider]    Inpatient Medications: Scheduled Meds: . aspirin EC  81 mg Oral Daily  . atorvastatin  40 mg Oral q1800  . carvedilol  3.125 mg Oral BID WC  . clonazePAM  1 mg Oral QHS  . clopidogrel  75 mg Oral Daily  . enoxaparin (LOVENOX) injection  40 mg Subcutaneous Q24H  . furosemide  40 mg Intravenous BID  . gabapentin  800 mg Oral TID  . glipiZIDE  10 mg Oral QAC breakfast  . insulin aspart  0-15 Units Subcutaneous TID WC  . insulin aspart  0-5 Units Subcutaneous QHS  . insulin glargine  80 Units Subcutaneous Daily  . isosorbide mononitrate  30 mg Oral Daily  . lisinopril  10 mg Oral Daily  . mometasone-formoterol  2 puff Inhalation BID  . potassium chloride  10 mEq Oral Daily  . sodium chloride flush  3 mL Intravenous Q12H   Continuous Infusions: . sodium chloride     PRN Meds: sodium chloride, acetaminophen, albuterol, nitroGLYCERIN, ondansetron (ZOFRAN) IV, oxyCODONE-acetaminophen **AND** oxyCODONE, sodium chloride flush  Allergies:   Allergies  Allergen Reactions  . Dulaglutide Anaphylaxis, Diarrhea and Hives    Social History:   Social History   Socioeconomic History  . Marital status: Single    Spouse name:  Not on file  . Number of children: Not on file  . Years of education: Not on file  . Highest education level: Not on file  Social Needs  . Financial resource strain: Not on file  . Food insecurity - worry: Not on file  . Food insecurity - inability: Not on file  . Transportation needs - medical: Not on file  . Transportation needs - non-medical: Not on file  Occupational History  . Occupation: on disability  Tobacco Use  . Smoking status: Current Every Day Smoker    Packs/day: 0.50    Years: 41.00    Pack years: 20.50    Types: Cigarettes  . Smokeless tobacco: Never Used  Substance and Sexual Activity  . Alcohol use: No  . Drug use: No  . Sexual activity: Not on file  Other Topics Concern  . Not on file  Social History Narrative   Lives at home with family. Independent at baseline.     Family History:  Family History  Problem Relation Age of Onset  . Heart attack Father   . Heart disease Father   . Hypertension Father   . Hyperlipidemia Father   . Hypertension Mother   . Hyperlipidemia Mother     ROS:  Review of Systems  Constitutional: Positive for malaise/fatigue. Negative for chills, diaphoresis, fever and weight loss.  HENT: Negative for congestion.   Eyes: Negative for discharge and redness.  Respiratory: Positive for cough, sputum production, shortness of breath and wheezing. Negative for hemoptysis.        Thick brown sputum  Cardiovascular: Positive for leg swelling. Negative for chest pain, palpitations, orthopnea, claudication and PND.  Gastrointestinal: Negative for abdominal pain, blood in stool, heartburn, melena, nausea and vomiting.  Genitourinary: Negative for hematuria.  Musculoskeletal: Negative for falls and myalgias.  Skin: Negative for rash.  Neurological: Positive for weakness. Negative for dizziness, tingling, tremors, sensory change, speech change, focal weakness and loss of consciousness.  Endo/Heme/Allergies: Does not bruise/bleed easily.   Psychiatric/Behavioral: Negative for substance abuse. The patient is not nervous/anxious.   All other systems reviewed and are  negative.     Physical Exam/Data:   Vitals:   04/23/17 0500 04/23/17 0800 04/23/17 0814 04/23/17 0900  BP: 115/87 (!) 116/54 (!) 116/54 92/68  Pulse: 86 88 (!) 127 87  Resp: 14 17  18   Temp:  (!) 28.3 F (36.3 C)    TempSrc:  Oral    SpO2: 99% 98%  (!) 88%  Weight:      Height:        Intake/Output Summary (Last 24 hours) at 04/23/2017 1003 Last data filed at 04/23/2017 0800 Gross per 24 hour  Intake 350 ml  Output 1825 ml  Net -1475 ml   Filed Weights   04/22/17 1939 04/23/17 0356  Weight: 240 lb (108.9 kg) 233 lb 11 oz (106 kg)   Body mass index is 37.72 kg/m.   Physical Exam: General: Well developed, well nourished, in no acute distress. Head: Normocephalic, atraumatic, sclera non-icteric, no xanthomas, nares without discharge.  Neck: Negative for carotid bruits. JVD elevated ~ 10 cm. Lungs: Diminished breath sounds bilaterally with rhonchi along the left base. Breathing is unlabored. Heart: RRR with S1 S2. No murmurs, rubs, or gallops appreciated. Abdomen: Soft, non-tender, mildly distended with normoactive bowel sounds. No hepatomegaly. No rebound/guarding. No obvious abdominal masses. Msk:  Strength and tone appear normal for age. Extremities: No clubbing or cyanosis. Trace bilateral pre-tibial edema. Distal pedal pulses are 2+ and equal bilaterally. Neuro: Alert and oriented X 3. No facial asymmetry. No focal deficit. Moves all extremities spontaneously. Psych:  Responds to questions appropriately with a normal affect.   EKG:  The EKG was personally reviewed and demonstrates: sinus tachycardia, 110 bpm, rare PVC, left anterior fascicular block, possible prior anterior MI, poor R wave progression, lateral TWI Telemetry:  Telemetry was personally reviewed and demonstrates: NSR to sinus tachycardia, 80s to 120s bpm  Weights: Filed  Weights   04/22/17 1939 04/23/17 0356  Weight: 240 lb (108.9 kg) 233 lb 11 oz (106 kg)    Relevant CV Studies: TTE 12/2014: Study Conclusions  - Left ventricle: The cavity size was normal. There was mild   concentric hypertrophy. Systolic function was normal. The   estimated ejection fraction was in the range of 55% to 60%. Wall   motion was normal; there were no regional wall motion   abnormalities. Doppler parameters are consistent with abnormal   left ventricular relaxation (grade 1 diastolic dysfunction).  Myoview 11/2014:  Pharmacological myocardial perfusion imaging study with no significant ischemia.  There is a small region of decreased perfusion in the apical region consistent with attenuation artifact, though a small region of ischemia can not be excluded.  The left ventricular ejection fraction is moderately decreased (30-44%). Septal wall hypokinesis noted. Unclear if this is secondary to significant ectopy/APCs.  No significant EKG changes concerning for ischemia.  This is a low risk study.  Laboratory Data:  Chemistry Recent Labs  Lab 04/22/17 1936 04/23/17 0409  NA 133*  --   K 4.0  --   CL 97*  --   CO2 27  --   GLUCOSE 291*  --   BUN 11  --   CREATININE 0.71 0.81  CALCIUM 8.7*  --   GFRNONAA >60 >60  GFRAA >60 >60  ANIONGAP 9  --     No results for input(s): PROT, ALBUMIN, AST, ALT, ALKPHOS, BILITOT in the last 168 hours. Hematology Recent Labs  Lab 04/22/17 1936 04/23/17 0409  WBC 10.2 7.6  RBC 5.24 5.05  HGB 12.6* 12.2*  HCT 39.2* 37.4*  MCV 74.7* 74.1*  MCH 24.0* 24.2*  MCHC 32.2 32.6  RDW 18.6* 18.8*  PLT 270 269   Cardiac Enzymes Recent Labs  Lab 04/22/17 1936 04/23/17 0409  TROPONINI 0.03* 0.04*   No results for input(s): TROPIPOC in the last 168 hours.  BNP Recent Labs  Lab 04/22/17 1936  BNP 1,528.0*    DDimer No results for input(s): DDIMER in the last 168 hours.  Radiology/Studies:  Dg Chest 2 View  Result  Date: 04/22/2017 IMPRESSION: Probable congestive heart failure with interstitial edema and small effusions, superimposed upon chronic lung disease. Cannot rule out the possibility of coexistent pneumonia. Electronically Signed   By: Nelson Chimes M.D.   On: 04/22/2017 20:08   Ct Chest Wo Contrast  Result Date: 04/23/2017 IMPRESSION: 1. Borderline cardiomegaly with findings of CHF and small bilateral pleural effusions. Superimposed pneumonia is not excluded. Clinical correlation is recommended. 2. Advanced multi vessel coronary vascular calcification as well as atherosclerotic disease of the aorta. 3. Aortic Atherosclerosis (ICD10-I70.0) and Emphysema (ICD10-J43.9). Electronically Signed   By: Anner Crete M.D.   On: 04/23/2017 00:20    Assessment and Plan:   1. Acute respiratory distress with hypoxia: -Likely multifactorial including PNA and CHF exacerbation  -Wean oxygen as able per IM -Supportive care as below -Breathing improving with nebs  2. Acute on chronic diastolic CHF: -Check echo to evaluate for newly reduced EF -Agree with gentle IV diuresis with KCl repletion -CHF education -Daily weights with strict Is and Os  3. PNA/AECOPD: -ABX, nebs, and steroids per IM  4. CAD/elevated troponin: -No chest pain -Elevated troponin minimal with a peak of 0.04 currently -Cycle troponin until peak for flat trend -No indication for heparin gtt at this time -Echo pending as above -Consider outpatient ischemic evaluation if echo shows preserved EF and normal wall motion -If echo shows newly reduced EF or new WMA he would likely need inpatient ischemic evaluation once his respiratory status is improved -ASA, Plavix, Coreg, Lipitor, lisinopril, Imdur  5. PAD: -Needs outpatient follow up -Smoking cessation advised -ASA  6. HLD: -Lipitor -Check lipid panel and LFT  7. DM2: -Check A1c   For questions or updates, please contact [place] Please consult [email or web address] for  contact info under Cardiology/STEMI.   Signed, Christell Faith, PA-C [place] Pager: [phone] 04/23/2017, 10:03 AM

## 2017-04-24 DIAGNOSIS — I5043 Acute on chronic combined systolic (congestive) and diastolic (congestive) heart failure: Secondary | ICD-10-CM

## 2017-04-24 DIAGNOSIS — J189 Pneumonia, unspecified organism: Secondary | ICD-10-CM

## 2017-04-24 DIAGNOSIS — R0602 Shortness of breath: Secondary | ICD-10-CM

## 2017-04-24 DIAGNOSIS — E782 Mixed hyperlipidemia: Secondary | ICD-10-CM

## 2017-04-24 LAB — BASIC METABOLIC PANEL
Anion gap: 9 (ref 5–15)
BUN: 16 mg/dL (ref 6–20)
CO2: 31 mmol/L (ref 22–32)
Calcium: 8.6 mg/dL — ABNORMAL LOW (ref 8.9–10.3)
Chloride: 97 mmol/L — ABNORMAL LOW (ref 101–111)
Creatinine, Ser: 0.94 mg/dL (ref 0.61–1.24)
GFR calc Af Amer: >60 mL/min (ref >60)
GFR calc non Af Amer: >60 mL/min (ref >60)
Glucose, Bld: 98 mg/dL (ref 65–99)
Potassium: 3.9 mmol/L (ref 3.5–5.1)
Sodium: 137 mmol/L (ref 135–145)

## 2017-04-24 LAB — GLUCOSE, CAPILLARY
Glucose-Capillary: 117 mg/dL — ABNORMAL HIGH (ref 65–99)
Glucose-Capillary: 168 mg/dL — ABNORMAL HIGH (ref 65–99)
Glucose-Capillary: 111 mg/dL — ABNORMAL HIGH (ref 65–99)
Glucose-Capillary: 336 mg/dL — ABNORMAL HIGH (ref 65–99)

## 2017-04-24 LAB — HIV ANTIBODY (ROUTINE TESTING W REFLEX): HIV Screen 4th Generation wRfx: NONREACTIVE

## 2017-04-24 MED ORDER — INSULIN GLARGINE 100 UNIT/ML ~~LOC~~ SOLN
40.0000 [IU] | Freq: Every day | SUBCUTANEOUS | Status: DC
  Administered 2017-04-24 – 2017-04-26 (×2): 40 [IU] via SUBCUTANEOUS
  Filled 2017-04-24 (×4): qty 0.4

## 2017-04-24 NOTE — Progress Notes (Signed)
[facility] [place] NAME: [name] [name]    MR#:  [id number]  DATE OF BIRTH:  04-07-1961  SUBJECTIVE: Patient is seen at bedside,  shortness of breath decreased, better than yesterday, decrease leg edema.   Patient here due to acute respiratory failure with hypoxia secondary to CHF.  Improving with diuresis.  Non-compliant with increasing salt intake and also tobacco abuse. No chest pain.    REVIEW OF SYSTEMS:    Review of Systems  Constitutional: Negative for chills and fever.  HENT: Negative for congestion and tinnitus.   Eyes: Negative for blurred vision and double vision.  Respiratory: Positive for shortness of breath. Negative for cough and wheezing.   Cardiovascular: Negative for chest pain, orthopnea and PND.  Gastrointestinal: Negative for abdominal pain, diarrhea, nausea and vomiting.  Genitourinary: Negative for dysuria and hematuria.  Neurological: Negative for dizziness, sensory change and focal weakness.  All other systems reviewed and are negative.   Nutrition: Heart Healthy/Carb control.  Tolerating Diet: Yes Tolerating PT: Await Eval.   DRUG ALLERGIES:   Allergies  Allergen Reactions  . Dulaglutide Anaphylaxis, Diarrhea and Hives    VITALS:  Blood pressure 124/84, pulse 94, temperature 97.9 F (36.6 C), temperature source Oral, resp. rate 18, height 5\' 6"  (1.676 m), weight 105.3 kg (232 lb 3.2 oz), SpO2 97 %.  PHYSICAL EXAMINATION:   Physical Exam  GENERAL:  56 y.o.-year-old obese patient lying in bed in no acute distress.  EYES: Pupils equal, round, reactive to light and accommodation. No scleral icterus. Extraocular muscles intact.  HEENT: Head atraumatic, normocephalic. Oropharynx and nasopharynx clear.  NECK:  Supple, no jugular venous distention. No thyroid enlargement, no tenderness.  LUNGS: Normal breath sounds bilaterally, no wheezing, bibasilar rales, rhonchi. No use of accessory muscles of respiration.   CARDIOVASCULAR: S1, S2 normal. No murmurs, rubs, or gallops.  ABDOMEN: Soft, nontender, nondistended. Bowel sounds present. No organomegaly or mass.  EXTREMITIES: No cyanosis, clubbing, + 1 edema b/l   NEUROLOGIC: Cranial nerves II through XII are intact. No focal Motor or sensory deficits b/l.   PSYCHIATRIC: The patient is alert and oriented x 3.  SKIN: No obvious rash, lesion, or ulcer.    LABORATORY PANEL:   CBC Recent Labs  Lab 04/23/17 0409  WBC 7.6  HGB 12.2*  HCT 37.4*  PLT 269   ------------------------------------------------------------------------------------------------------------------  Chemistries  Recent Labs  Lab 04/24/17 0612  NA 137  K 3.9  CL 97*  CO2 31  GLUCOSE 98  BUN 16  CREATININE 0.94  CALCIUM 8.6*   ------------------------------------------------------------------------------------------------------------------  Cardiac Enzymes Recent Labs  Lab 04/23/17 1553  TROPONINI 0.03*   ------------------------------------------------------------------------------------------------------------------  RADIOLOGY:  Dg Chest 2 View  Result Date: 04/22/2017 CLINICAL DATA:  Shortness of breath over the last 3-4 weeks. EXAM: CHEST  2 VIEW COMPARISON:  07/10/2016 FINDINGS: The heart is enlarged. There is aortic atherosclerosis. There is a background pattern of abnormal interstitial lung markings, which appear more prominent today. There are small bilateral effusions. The findings are most consistent with fluid overload/congestive heart failure. Coexistent pneumonia not excluded. No acute bone finding. IMPRESSION: Probable congestive heart failure with interstitial edema and small effusions, superimposed upon chronic lung disease. Cannot rule out the possibility of coexistent pneumonia. Electronically Signed   By: Nelson Chimes M.D.   On: 04/22/2017 20:08   Ct Chest Wo Contrast  Result Date: 04/23/2017 CLINICAL DATA:  56 year old male with shortness of  breath. History of COPD and pneumonia.  EXAM: CT CHEST WITHOUT CONTRAST TECHNIQUE: Multidetector CT imaging of the chest was performed following the standard protocol without IV contrast. COMPARISON:  Chest radiograph.  04/22/2017 and CT dated 07/10/2016. FINDINGS: Evaluation of this exam is limited in the absence of intravenous contrast. Evaluation is also limited due to respiratory motion artifact. Cardiovascular: There is borderline cardiomegaly. No pericardial effusion. There is advanced coronary vascular calcification with involvement of the left main, LAD, left circumflex artery, and RCA. There is atherosclerotic calcification of the thoracic aorta and aortic arch. Slight prominence of the pulmonary arteries suggestive of underlying pulmonary hypertension. Mediastinum/Nodes: No hilar or mediastinal adenopathy. Multiple small mediastinal lymph nodes noted. A right paratracheal lymph node measures 10 mm in short axis. Evaluation of the hila however is limited in the absence of intravenous contrast. The esophagus is grossly unremarkable. Lungs/Pleura: Mild paraseptal emphysema. There are small bilateral pleural effusions, right greater than left. Minimal bibasilar compressive atelectasis noted. There is no focal consolidation. There is mild cephalization with interstitial prominence consistent with mild pulmonary edema . Superimposed pneumonia is not excluded. Clinical correlation is recommended. The central airways are patent. Upper Abdomen: Suboptimally visualized and evaluated due to respiratory motion artifact. Musculoskeletal: Mild degenerative changes of the spine. No acute osseous pathology. IMPRESSION: 1. Borderline cardiomegaly with findings of CHF and small bilateral pleural effusions. Superimposed pneumonia is not excluded. Clinical correlation is recommended. 2. Advanced multi vessel coronary vascular calcification as well as atherosclerotic disease of the aorta. 3. Aortic Atherosclerosis (ICD10-I70.0)  and Emphysema (ICD10-J43.9). Electronically Signed   By: Anner Crete M.D.   On: 04/23/2017 00:20     ASSESSMENT AND PLAN:   56 year old male with past medical history of diabetes, hypertension, hyperlipidemia, morbid obesity, peripheral vascular disease, chronic diastolic CHF, COPD, BPH who presents to the hospital due to shortness of breath and noted to be in acute respiratory failure with hypoxia secondary to CHF.  1. Acute respiratory failure with hypoxia-secondary to CHF. -Currently better, O2 saturation 97% on room air, changed to Lasix from IV to p.o. today.  2. CHF-acute on chronic diastolic dysfunction. Await echocardiogram results.  Echo  is not done yet.. -continue diuresis with IV Lasix, follow I's and O's and daily weights. Continue carvedilol, lisinopril. -Appreciate cardiology input  3. COPD-no acute exacerbation. Continue Dulera, DuoNeb nebs as needed. Acute bronchitis: Continue albuterol nebulizer, Dulera.  No pneumonia.  4. Diabetes type 2 without complication- continue Lantus, doses adjusted, seen by diabetes coordinator. 5. Hyperlipidemia-continue atorvastatin.  6. Anxiety-continue Klonopin.  7. Neuropathy-continue gabapentin.    All the records are reviewed and case discussed with Care Management/Social Worker. Management plans discussed with the patient, family and they are in agreement.  CODE STATUS: Full code  DVT Prophylaxis: Lovenox  TOTAL TIME TAKING CARE OF THIS PATIENT: 30 minutes.   POSSIBLE D/C IN 2-3 DAYS, DEPENDING ON CLINICAL CONDITION.   Epifanio Lesches M.D on 04/24/2017 at 1:15 PM  Between 7am to 6pm - Pager - [phone]  After 6pm go to [email or web address] - [employer] [facility]  Office  [phone]  CC: Primary care physician; Clarisse Gouge, MD

## 2017-04-24 NOTE — Care Management (Signed)
Reached out to [facility] and patient is current and in good standing.

## 2017-04-24 NOTE — Progress Notes (Addendum)
Inpatient Diabetes Program Recommendations  AACE/ADA: New Consensus Statement on Inpatient Glycemic Control (2015)  Target Ranges:  Prepandial:   less than 140 mg/dL      Peak postprandial:   less than 180 mg/dL (1-2 hours)      Critically ill patients:  140 - 180 mg/dL   Lab Results  Component Value Date   GLUCAP 117 (H) 04/24/2017   HGBA1C 8.7 (H) 04/23/2017    Review of Glycemic Control  Results for [name] [name], [name] [name] (MRN [id number]) as of 04/24/2017 09:21  Ref. Range 04/23/2017 11:31 04/23/2017 16:04 04/23/2017 17:23 04/23/2017 21:31 04/24/2017 08:06  Glucose-Capillary Latest Ref Range: 65 - 99 mg/dL 81 68 112 (H) 111 (H) 117 (H)    Diabetes history: Type 2 Outpatient Diabetes medications:Glipizide 10mg  qam, Humalog 0-10 units tid, Glucophage 1000mg  bid * has not taken any diabetes medication for 3 weeks   Current orders for Inpatient glycemic control: Novolog 0-15 units tid, Novolog 0-5 units qhs  Inpatient Diabetes Program Recommendations: **Low blood sugar yesterday likely as a result of Glipizide (now d/c'd).    After discussing patient's current use of NO diabetes medications at home for the past 3 weeks, recommend restarting Lantus 40 units qday starting now (although I believe he would be OK with Lantus 80 units, we can reassess tomorrow)  Patient reports losing about 30 lbs over the last few weeks because he has been so short of breath and has not been eating.  Denies any hypoglycemia.   Gentry Fitz, RN, BA, MHA, CDE Diabetes Coordinator Inpatient Diabetes Program  [phone] (Team Pager) [phone] ([place]) 04/24/2017 9:33 AM

## 2017-04-24 NOTE — Progress Notes (Signed)
Progress Note  Patient Name: [name] [name] Date of Encounter: 04/24/2017  Primary Cardiologist: new to [facility]  Subjective   Improved breathing, Significant diuresis in the past 24 hours, 2.3 L He is 4.7 L total out Reports antibiotics seem to be working, still with productive sputum Feels more comfortable,  not at his baseline  Inpatient Medications    Scheduled Meds: . aspirin EC  81 mg Oral Daily  . atorvastatin  40 mg Oral q1800  . carvedilol  3.125 mg Oral BID WC  . clonazePAM  1 mg Oral QHS  . clopidogrel  75 mg Oral Daily  . enoxaparin (LOVENOX) injection  40 mg Subcutaneous Q24H  . furosemide  40 mg Intravenous BID  . gabapentin  800 mg Oral TID  . insulin aspart  0-15 Units Subcutaneous TID WC  . insulin aspart  0-5 Units Subcutaneous QHS  . insulin glargine  40 Units Subcutaneous QHS  . isosorbide mononitrate  30 mg Oral Daily  . lisinopril  10 mg Oral Daily  . mometasone-formoterol  2 puff Inhalation BID  . potassium chloride  10 mEq Oral Daily  . sodium chloride flush  3 mL Intravenous Q12H   Continuous Infusions: . sodium chloride     PRN Meds: sodium chloride, acetaminophen, albuterol, nitroGLYCERIN, ondansetron (ZOFRAN) IV, oxyCODONE-acetaminophen **AND** oxyCODONE, sodium chloride flush   Vital Signs    Vitals:   04/24/17 0501 04/24/17 0640 04/24/17 0805 04/24/17 1716  BP: 110/78  124/84 122/75  Pulse: (!) 117  94 95  Resp: 18  18 18   Temp: 97.7 F (36.5 C)  97.9 F (36.6 C) 98 F (36.7 C)  TempSrc: Oral  Oral   SpO2: 93%  97% 94%  Weight:  232 lb 3.2 oz (105.3 kg)    Height:        Intake/Output Summary (Last 24 hours) at 04/24/2017 1838 Last data filed at 04/24/2017 1836 Gross per 24 hour  Intake 960 ml  Output 4050 ml  Net -3090 ml   Filed Weights   04/23/17 0356 04/23/17 1858 04/24/17 0640  Weight: 233 lb 11 oz (106 kg) 236 lb 12.8 oz (107.4 kg) 232 lb 3.2 oz (105.3 kg)    Telemetry    Normal sinus rhythm- Personally  Reviewed  ECG      Physical Exam   GEN: No acute distress.  Obese Neck: No JVD Cardiac: RRR, no murmurs, rubs, or gallops.  Respiratory:  Mildly decreased breath sounds, rales at the right base GI: Soft, nontender, non-distended  MS: No edema; No deformity. Neuro:  Nonfocal  Psych: Normal affect   Labs    Chemistry Recent Labs  Lab 04/22/17 1936 04/23/17 0409 04/24/17 0612  NA 133*  --  137  K 4.0  --  3.9  CL 97*  --  97*  CO2 27  --  31  GLUCOSE 291*  --  98  BUN 11  --  16  CREATININE 0.71 0.81 0.94  CALCIUM 8.7*  --  8.6*  GFRNONAA >60 >60 >60  GFRAA >60 >60 >60  ANIONGAP 9  --  9     Hematology Recent Labs  Lab 04/22/17 1936 04/23/17 0409  WBC 10.2 7.6  RBC 5.24 5.05  HGB 12.6* 12.2*  HCT 39.2* 37.4*  MCV 74.7* 74.1*  MCH 24.0* 24.2*  MCHC 32.2 32.6  RDW 18.6* 18.8*  PLT 270 269    Cardiac Enzymes Recent Labs  Lab 04/22/17 1936 04/23/17 0409 04/23/17 1002 04/23/17  1553  TROPONINI 0.03* 0.04* 0.03* 0.03*   No results for input(s): TROPIPOC in the last 168 hours.   BNP Recent Labs  Lab 04/22/17 1936  BNP 1,528.0*     DDimer No results for input(s): DDIMER in the last 168 hours.   Radiology    Dg Chest 2 View  Result Date: 04/22/2017 CLINICAL DATA:  Shortness of breath over the last 3-4 weeks. EXAM: CHEST  2 VIEW COMPARISON:  07/10/2016 FINDINGS: The heart is enlarged. There is aortic atherosclerosis. There is a background pattern of abnormal interstitial lung markings, which appear more prominent today. There are small bilateral effusions. The findings are most consistent with fluid overload/congestive heart failure. Coexistent pneumonia not excluded. No acute bone finding. IMPRESSION: Probable congestive heart failure with interstitial edema and small effusions, superimposed upon chronic lung disease. Cannot rule out the possibility of coexistent pneumonia. Electronically Signed   By: Nelson Chimes M.D.   On: 04/22/2017 20:08   Ct  Chest Wo Contrast  Result Date: 04/23/2017 CLINICAL DATA:  56 year old male with shortness of breath. History of COPD and pneumonia. EXAM: CT CHEST WITHOUT CONTRAST TECHNIQUE: Multidetector CT imaging of the chest was performed following the standard protocol without IV contrast. COMPARISON:  Chest radiograph.  04/22/2017 and CT dated 07/10/2016. FINDINGS: Evaluation of this exam is limited in the absence of intravenous contrast. Evaluation is also limited due to respiratory motion artifact. Cardiovascular: There is borderline cardiomegaly. No pericardial effusion. There is advanced coronary vascular calcification with involvement of the left main, LAD, left circumflex artery, and RCA. There is atherosclerotic calcification of the thoracic aorta and aortic arch. Slight prominence of the pulmonary arteries suggestive of underlying pulmonary hypertension. Mediastinum/Nodes: No hilar or mediastinal adenopathy. Multiple small mediastinal lymph nodes noted. A right paratracheal lymph node measures 10 mm in short axis. Evaluation of the hila however is limited in the absence of intravenous contrast. The esophagus is grossly unremarkable. Lungs/Pleura: Mild paraseptal emphysema. There are small bilateral pleural effusions, right greater than left. Minimal bibasilar compressive atelectasis noted. There is no focal consolidation. There is mild cephalization with interstitial prominence consistent with mild pulmonary edema . Superimposed pneumonia is not excluded. Clinical correlation is recommended. The central airways are patent. Upper Abdomen: Suboptimally visualized and evaluated due to respiratory motion artifact. Musculoskeletal: Mild degenerative changes of the spine. No acute osseous pathology. IMPRESSION: 1. Borderline cardiomegaly with findings of CHF and small bilateral pleural effusions. Superimposed pneumonia is not excluded. Clinical correlation is recommended. 2. Advanced multi vessel coronary vascular  calcification as well as atherosclerotic disease of the aorta. 3. Aortic Atherosclerosis (ICD10-I70.0) and Emphysema (ICD10-J43.9). Electronically Signed   By: Anner Crete M.D.   On: 04/23/2017 00:20    Cardiac Studies     Patient Profile     57 year old gentleman with history of coronary artery disease, previous PCI to the LAD 2013 and PCI to the RCA 2014, prior history of chronic diastolic CHF, PAD with stenting SFA October 2014, asthma and COPD prior smoking history who presents with worsening shortness of breath and cough   Assessment & Plan    A/P: 1) acute respiratory distress Suspect bronchitis and acute on chronic diastolic CHF Symptoms improving on antibiotics, aggressive diuretics Close to 5 L negative this admission  2) chronic diastolic CHF dietary noncompliance Would start Lasix 40 mg twice daily at home at time of discharge Does not own a scale, recommended he buy 1 to track daily weights outpatient CHF clinic daily weights, and adjustment of  Lasix based on weight Needs CHF education, instruction on low-sodium diet  3) pneumonia Superimposed it would appear on CT scan Brown thick sputum for the past several weeks likely main contributor for his presentation On broad-spectrum antibiotics, steroids, nebulizers Slowly improving  4) CAD Prior stenting LAD and  RCA No significant troponin elevation, no ischemic workup at this time Echocardiogram pending Continue aspirin, Plavix Lipitor and beta-blocker  5) PAD Prior stenting to SFA Aggressive lipid management, smoking cessation Stable   Total encounter time more than 25 minutes  Greater than 50% was spent in counseling and coordination of care with the patient    For questions or updates, please contact [facility] Please consult [email or web address] for contact info under Cardiology/STEMI.      Signed, Ida Rogue, MD  04/24/2017, 6:38 PM

## 2017-04-25 ENCOUNTER — Inpatient Hospital Stay (HOSPITAL_COMMUNITY)
Admit: 2017-04-25 | Discharge: 2017-04-25 | Disposition: A | Discharge status: Home / Self Care | Payer: Medicaid Other | Attending: Cardiovascular Disease | Admitting: Cardiovascular Disease

## 2017-04-25 DIAGNOSIS — I255 Ischemic cardiomyopathy: Secondary | ICD-10-CM

## 2017-04-25 DIAGNOSIS — J810 Acute pulmonary edema: Secondary | ICD-10-CM

## 2017-04-25 DIAGNOSIS — I5030 Unspecified diastolic (congestive) heart failure: Secondary | ICD-10-CM

## 2017-04-25 LAB — GLUCOSE, CAPILLARY
Glucose-Capillary: 172 mg/dL — ABNORMAL HIGH (ref 65–99)
Glucose-Capillary: 194 mg/dL — ABNORMAL HIGH (ref 65–99)
Glucose-Capillary: 176 mg/dL — ABNORMAL HIGH (ref 65–99)
Glucose-Capillary: 180 mg/dL — ABNORMAL HIGH (ref 65–99)

## 2017-04-25 LAB — BASIC METABOLIC PANEL
Anion gap: 9 (ref 5–15)
BUN: 17 mg/dL (ref 6–20)
CO2: 31 mmol/L (ref 22–32)
Calcium: 8.5 mg/dL — ABNORMAL LOW (ref 8.9–10.3)
Chloride: 96 mmol/L — ABNORMAL LOW (ref 101–111)
Creatinine, Ser: 0.91 mg/dL (ref 0.61–1.24)
GFR calc Af Amer: >60 mL/min (ref >60)
GFR calc non Af Amer: >60 mL/min (ref >60)
Glucose, Bld: 144 mg/dL — ABNORMAL HIGH (ref 65–99)
Potassium: 3.7 mmol/L (ref 3.5–5.1)
Sodium: 136 mmol/L (ref 135–145)

## 2017-04-25 LAB — ECHOCARDIOGRAM COMPLETE
Height: 66 in
Weight: 3731.2 oz

## 2017-04-25 MED ORDER — AMOXICILLIN-POT CLAVULANATE 875-125 MG PO TABS: 1.0000 | ORAL_TABLET | Freq: Two times a day (BID) | ORAL | 0 refills | Status: DC

## 2017-04-25 MED ORDER — FUROSEMIDE 10 MG/ML IJ SOLN
40.0000 mg | Freq: Every day | INTRAMUSCULAR | Status: DC
  Administered 2017-04-26 – 2017-04-27 (×2): 40 mg via INTRAVENOUS
  Filled 2017-04-25 (×2): qty 4

## 2017-04-25 MED ORDER — CARVEDILOL 3.125 MG PO TABS: 3.1250 mg | ORAL_TABLET | Freq: Two times a day (BID) | ORAL | 0 refills | Status: DC

## 2017-04-25 MED ORDER — INSULIN ASPART 100 UNIT/ML ~~LOC~~ SOLN
3.0000 [IU] | Freq: Three times a day (TID) | SUBCUTANEOUS | Status: DC
  Administered 2017-04-25 – 2017-04-28 (×7): 3 [IU] via SUBCUTANEOUS
  Filled 2017-04-25 (×6): qty 1

## 2017-04-25 MED ORDER — LISINOPRIL 10 MG PO TABS: 10.0000 mg | ORAL_TABLET | Freq: Every day | ORAL | 0 refills | Status: DC

## 2017-04-25 MED ORDER — INSULIN GLARGINE 100 UNIT/ML ~~LOC~~ SOLN: 40.0000 [IU] | Freq: Every day | SUBCUTANEOUS | 11 refills | Status: DC

## 2017-04-25 MED ORDER — FUROSEMIDE 40 MG PO TABS
40.0000 mg | ORAL_TABLET | Freq: Two times a day (BID) | ORAL | 11 refills | Status: DC
Start: 2017-04-25 — End: 2017-04-29

## 2017-04-25 NOTE — Plan of Care (Signed)
  Progressing Education: Ability to verbalize understanding of medication therapies will improve 04/25/2017 1649 - Progressing by Feliberto Gottron, RN Education: Knowledge of General Education information will improve 04/25/2017 1649 - Progressing by Feliberto Gottron, RN Health Behavior/Discharge Planning: Ability to manage health-related needs will improve 04/25/2017 1649 - Progressing by Feliberto Gottron, RN Clinical Measurements: Will remain free from infection 04/25/2017 1649 - Progressing by Chriss Czar, Illene Bolus, RN Activity: Risk for activity intolerance will decrease 04/25/2017 1649 - Progressing by Chriss Czar, Illene Bolus, RN Nutrition: Adequate nutrition will be maintained 04/25/2017 1649 - Progressing by Chriss Czar, Illene Bolus, RN Safety: Ability to remain free from injury will improve 04/25/2017 1649 - Progressing by Chriss Czar, Illene Bolus, RN Skin Integrity: Risk for impaired skin integrity will decrease 04/25/2017 1649 - Progressing by Feliberto Gottron, RN

## 2017-04-25 NOTE — Progress Notes (Signed)
Progress Note  Patient Name: [name] [name] Date of Encounter: 04/25/2017  Primary Cardiologist: new to [facility]  Subjective   Improved breathing, 4 L negative yesterday Productive cough improving Still not at his baseline but slowly getting there  Echocardiogram result has come back, ejection fraction 25-30%, previously was normal  Inpatient Medications    Scheduled Meds: . aspirin EC  81 mg Oral Daily  . atorvastatin  40 mg Oral q1800  . carvedilol  3.125 mg Oral BID WC  . clonazePAM  1 mg Oral QHS  . clopidogrel  75 mg Oral Daily  . enoxaparin (LOVENOX) injection  40 mg Subcutaneous Q24H  . furosemide  40 mg Intravenous BID  . gabapentin  800 mg Oral TID  . insulin aspart  0-15 Units Subcutaneous TID WC  . insulin aspart  0-5 Units Subcutaneous QHS  . insulin aspart  3 Units Subcutaneous TID WC  . insulin glargine  40 Units Subcutaneous QHS  . isosorbide mononitrate  30 mg Oral Daily  . lisinopril  10 mg Oral Daily  . mometasone-formoterol  2 puff Inhalation BID  . potassium chloride  10 mEq Oral Daily  . sodium chloride flush  3 mL Intravenous Q12H   Continuous Infusions: . sodium chloride     PRN Meds: sodium chloride, acetaminophen, albuterol, nitroGLYCERIN, ondansetron (ZOFRAN) IV, oxyCODONE-acetaminophen **AND** oxyCODONE, sodium chloride flush   Vital Signs    Vitals:   04/24/17 2058 04/25/17 0430 04/25/17 0841 04/25/17 1124  BP: 114/73 112/82 115/78 119/77  Pulse: 96 81 95 89  Resp: 16 18    Temp: 98.4 F (36.9 C) 98.1 F (36.7 C) 98.1 F (36.7 C)   TempSrc: Oral Oral Oral   SpO2: 92% 91% 94%   Weight:  233 lb 3.2 oz (105.8 kg)    Height:        Intake/Output Summary (Last 24 hours) at 04/25/2017 1328 Last data filed at 04/25/2017 1131 Gross per 24 hour  Intake 960 ml  Output 4600 ml  Net -3640 ml   Filed Weights   04/23/17 1858 04/24/17 0640 04/25/17 0430  Weight: 236 lb 12.8 oz (107.4 kg) 232 lb 3.2 oz (105.3 kg) 233 lb 3.2 oz (105.8  kg)    Telemetry    Normal sinus rhythm- Personally Reviewed  ECG      Physical Exam   No significant change in exam low GEN: No acute distress.  Obese Neck: No JVD Cardiac: RRR, no murmurs, rubs, or gallops.  Respiratory:  Mildly decreased breath sounds, rales at the right base GI: Soft, nontender, non-distended  MS: No edema; No deformity. Neuro:  Nonfocal  Psych: Normal affect   Labs    Chemistry Recent Labs  Lab 04/22/17 1936 04/23/17 0409 04/24/17 0612 04/25/17 0410  NA 133*  --  137 136  K 4.0  --  3.9 3.7  CL 97*  --  97* 96*  CO2 27  --  31 31  GLUCOSE 291*  --  98 144*  BUN 11  --  16 17  CREATININE 0.71 0.81 0.94 0.91  CALCIUM 8.7*  --  8.6* 8.5*  GFRNONAA >60 >60 >60 >60  GFRAA >60 >60 >60 >60  ANIONGAP 9  --  9 9     Hematology Recent Labs  Lab 04/22/17 1936 04/23/17 0409  WBC 10.2 7.6  RBC 5.24 5.05  HGB 12.6* 12.2*  HCT 39.2* 37.4*  MCV 74.7* 74.1*  MCH 24.0* 24.2*  MCHC 32.2 32.6  RDW 18.6* 18.8*  PLT 270 269    Cardiac Enzymes Recent Labs  Lab 04/22/17 1936 04/23/17 0409 04/23/17 1002 04/23/17 1553  TROPONINI 0.03* 0.04* 0.03* 0.03*   No results for input(s): TROPIPOC in the last 168 hours.   BNP Recent Labs  Lab 04/22/17 1936  BNP 1,528.0*     DDimer No results for input(s): DDIMER in the last 168 hours.   Radiology    No results found.  Cardiac Studies     Patient Profile     56 year old gentleman with history of coronary artery disease, previous PCI to the LAD 2013 and PCI to the RCA 2014, prior history of chronic diastolic CHF, PAD with stenting SFA October 2014, asthma and COPD prior smoking history who presents with worsening shortness of breath and cough   Assessment & Plan    A/P: 1) acute respiratory distress Suspect bronchitis and acute  systolic CHF Symptoms improving on antibiotics, aggressive diuretics Systolic dysfunction likely more of a contributor than previously estimated, ejection  fraction severely depressed on recent echocardiogram  2) acute systolic and diastolic CHF New drop in ejection fraction profoundly low 25-30% concerning for underlying ischemia Improved status, low right heart pressures on echo Can back off on diuretics to daily 40 mg IV in the morning  3) pneumonia Superimposed it would appear on CT scan Brown thick sputum for the past several weeks likely main contributor for his presentation On broad-spectrum antibiotics, steroids, nebulizers Improving  4) cardiomyopathy With concern for ischemic in nature given prior stenting LAD and  RCA Continue aspirin, Plavix Lipitor and beta-blocker Discussed various treatment options with him, will plan for cardiac catheterization on Monday given severely depressed ejection fraction Unable to exclude anterior and anterior septal wall akinesis  5) PAD Prior stenting to SFA Aggressive lipid management, smoking cessation Stable  Long discussion with him concerning above  Total encounter time more than 35 minutes  Greater than 50% was spent in counseling and coordination of care with the patient    For questions or updates, please contact [facility] Please consult [email or web address] for contact info under Cardiology/STEMI.      Signed, Ida Rogue, MD  04/25/2017, 1:28 PM

## 2017-04-25 NOTE — Progress Notes (Signed)
[place] [facility] [place] NAME: [name] [name]    MR#:  [id number]  DATE OF BIRTH:  1961-06-06  Breathing better than before, no chest pain.  Echocardiogram showed EF 20-25%.  Cardiology recommending left heart catheterization.  Patient is willing to stay until Monday to have a cardiac cath on Monday.   Patient here due to acute respiratory failure with hypoxia secondary to CHF.  Improving with diuresis.  Non-compliant with increasing salt intake and also tobacco abuse. No chest pain.    REVIEW OF SYSTEMS:    Review of Systems  Constitutional: Negative for chills and fever.  HENT: Negative for congestion and tinnitus.   Eyes: Negative for blurred vision and double vision.  Respiratory: Positive for shortness of breath. Negative for cough and wheezing.   Cardiovascular: Negative for chest pain, orthopnea and PND.  Gastrointestinal: Negative for abdominal pain, diarrhea, nausea and vomiting.  Genitourinary: Negative for dysuria and hematuria.  Neurological: Negative for dizziness, sensory change and focal weakness.  All other systems reviewed and are negative.   Nutrition: Heart Healthy/Carb control.  Tolerating Diet: Yes Tolerating PT: Await Eval.   DRUG ALLERGIES:   Allergies  Allergen Reactions  . Dulaglutide Anaphylaxis, Diarrhea and Hives    VITALS:  Blood pressure 119/77, pulse 89, temperature 98.1 F (36.7 C), temperature source Oral, resp. rate 18, height 5\' 6"  (1.676 m), weight 105.8 kg (233 lb 3.2 oz), SpO2 94 %.  PHYSICAL EXAMINATION:   Physical Exam  GENERAL:  56 y.o.-year-old obese patient lying in bed in no acute distress.  EYES: Pupils equal, round, reactive to light and accommodation. No scleral icterus. Extraocular muscles intact.  HEENT: Head atraumatic, normocephalic. Oropharynx and nasopharynx clear.  NECK:  Supple, no jugular venous distention. No thyroid enlargement, no tenderness.  LUNGS: Normal breath sounds  bilaterally, no wheezing, bibasilar rales, rhonchi. No use of accessory muscles of respiration.  CARDIOVASCULAR: S1, S2 normal. No murmurs, rubs, or gallops.  ABDOMEN: Soft, nontender, nondistended. Bowel sounds present. No organomegaly or mass.  EXTREMITIES: No cyanosis, clubbing, + 1 edema b/l   NEUROLOGIC: Cranial nerves II through XII are intact. No focal Motor or sensory deficits b/l.   PSYCHIATRIC: The patient is alert and oriented x 3.  SKIN: No obvious rash, lesion, or ulcer.    LABORATORY PANEL:   CBC Recent Labs  Lab 04/23/17 0409  WBC 7.6  HGB 12.2*  HCT 37.4*  PLT 269   ------------------------------------------------------------------------------------------------------------------  Chemistries  Recent Labs  Lab 04/25/17 0410  NA 136  K 3.7  CL 96*  CO2 31  GLUCOSE 144*  BUN 17  CREATININE 0.91  CALCIUM 8.5*   ------------------------------------------------------------------------------------------------------------------  Cardiac Enzymes Recent Labs  Lab 04/23/17 1553  TROPONINI 0.03*   ------------------------------------------------------------------------------------------------------------------  RADIOLOGY:  No results found.   ASSESSMENT AND PLAN:   56 year old male with past medical history of diabetes, hypertension, hyperlipidemia, morbid obesity, peripheral vascular disease, chronic diastolic CHF, COPD, BPH who presents to the hospital due to shortness of breath and noted to be in acute respiratory failure with hypoxia secondary to CHF.  1. Acute respiratory failure with hypoxia-secondary to CHF.   -Currently better, O2 saturation 97% on room air, changed to Lasix from IV to p.o. today.  2. CHF- echocardiogram this time showed EF 25-30% with diffuse hypokinesia, patient had previously normal echo in 2016.  Concerning decreased ejection fraction, recommend cardiac cath as discussed with the cardiologist Dr. Esmond Plants.  Patient willing to  stay until Monday to have cardiac cath on Monday./Continue aspirin, Plavix, lisinopril, Lasix, Coreg. 3. COPD-no acute exacerbation. Continue Dulera, DuoNeb nebs as needed. Acute bronchitis: Continue albuterol nebulizer, Dulera.  No pneumonia.  Improvement antibiotics.  Lungs are clear today.  4. Diabetes type 2 without complication- continue Lantus, daily NovoLog 3 units 3 times daily with meals, increase the Lantus to 45 units nightly. 5. Hyperlipidemia-continue atorvastatin.  6. Anxiety-continue Klonopin.  7. Neuropathy-continue gabapentin.    All the records are reviewed and case discussed with Care Management/Social Worker. Management plans discussed with the patient, family and they are in agreement.  CODE STATUS: Full code  DVT Prophylaxis: Lovenox  TOTAL TIME TAKING CARE OF THIS PATIENT: 30 minutes.   POSSIBLE D/C IN 2-3 DAYS, DEPENDING ON CLINICAL CONDITION.   Epifanio Lesches M.D on 04/25/2017 at 1:19 PM  Between 7am to 6pm - Pager - [phone]  After 6pm go to [email or web address] - [employer] [facility]  Office  [phone]  CC: Primary care physician; Clarisse Gouge, MD

## 2017-04-25 NOTE — Progress Notes (Signed)
Inpatient Diabetes Program Recommendations  AACE/ADA: New Consensus Statement on Inpatient Glycemic Control (2015)  Target Ranges:  Prepandial:   less than 140 mg/dL      Peak postprandial:   less than 180 mg/dL (1-2 hours)      Critically ill patients:  140 - 180 mg/dL   Lab Results  Component Value Date   GLUCAP 172 (H) 04/25/2017   HGBA1C 8.7 (H) 04/23/2017    Review of Glycemic Control  Results for PARAM, CAPRI (MRN [id number]) as of 04/25/2017 09:59  Ref. Range 04/24/2017 08:06 04/24/2017 11:38 04/24/2017 16:52 04/24/2017 20:59 04/25/2017 08:29  Glucose-Capillary Latest Ref Range: 65 - 99 mg/dL 117 (H) 168 (H) 111 (H) 336 (H) 172 (H)   Diabetes history: Type 2 Outpatient Diabetes medications:Glipizide 10mg  qam, Humalog 0-10 units tid, Glucophage 1000mg  bid * has not taken any diabetes medication for 3 weeks   Current orders for Inpatient glycemic control: Novolog 0-15 units tid, Novolog 0-5 units qhs  Inpatient Diabetes Program Recommendations:  Consider increasing Lantus to 45 units qhs.  Consider adding Novolog 3 units tid with meals (hold if he eats less than 50%)  Gentry Fitz, RN, [place], [place], CDE Diabetes Coordinator Inpatient Diabetes Program  [phone] (Team Pager) [phone] ([place]) 04/25/2017 10:00 AM

## 2017-04-25 NOTE — Progress Notes (Addendum)
Patient admitted with dx of CHF.  Echo performed on 04/25/2017 revealed EF of 25 - 30%.  History:   PMH:  Atherosclerosis, PAD, PVD, chronic diastolic CHF, sleep apnea, syncope and collapse, tobacco use.  Chronic obstructive pulmonary disease.  Risk factors:  Hypertension. Diabetes mellitus.  CHF Education:   Educational session with patient completed. Patient sitting up in the recliner chair. Noted note entered from night nurse that patient was not retaining information.  Patient alert and oriented this morning and was able to repeat back information to this RN.  Patient lives with his significant other, who was not present.   ? Provided patient with "Living Better with Heart Failure" packet. Briefly reviewed definition of heart failure and signs and symptoms of an exacerbation. Discussed the meaning of EF with patient.  ? *Reviewed importance of and reason behind checking weight daily in the AM, after using the bathroom, but before getting dressed.  Patient will need scales.  CM to provide scales.   ? Reviewed the following information with patient:  *Discussed when to call the Dr= weight gain of >2lb overnight of 5lb in a week,  *Discussed yellow zone= call MD: weight gain of >2lb overnight of 5lb in a week, increased swelling, increased SOB when lying down, chest discomfort, dizziness, increased fatigue *Red Zone= call 911: struggle to breath, fainting or near fainting, significant chest pain   Patient stated he presented to the ER with worsening SOB, cough,  and swelling in his feet and ankles.   ? *Reviewed low sodium diet-provided handout of recommended and not recommended foods.  Reviewed reading labels with patient.  Discussed fluid intake with patient as well.  Patient not currently on a fluid restriction, but advised no more than 8-8 ounces glass of fluids per day.  Patient stated this is one area that will be hard for him because he really likes salt.   Dietitian Consult for diet  education entered.   ? *Instructed patient to take medications as prescribed for heart failure. Explained briefly why pt is on the medications (either make you feel better, live longer or keep you out of the hospital) and discussed monitoring and side effects.  ? *Discussed exercise. Patient stated he used to be very active.  Patient informed this RN that he walks down his long driveway - about half way then has to turn around because he is so short of breath.  I encouraged patient to continue doing this every day and to gradually increase the distance and time as tolerated, but to remain ACTIVE.  Note:  Patient does not have a payor source and has applied for Medicaid.  This RN informed patient about Cardiac Rehab.  Overview of program discussed.  Patient is unable to pay out of pocket for Rehab.  Patient stated he would be interested in the program once his Medicaid is approved.  In the meantime patient plans to continue walking in his driveway.   ? *Smoking Cessation also discussed. Patient stated he used to smoke one pack per day, but now is down to 1/2 pack per day.  Information provided to patient on the effects of smoking on the body as well as benefits and changes that occur once one quits smoking. "Tips for Quitting" informational sheet provided and reviewed with patient. Information on Apps for Relaxation and Apps for Smoking Cessation as well as information on Quit Smart Classes provided to patient.   ? Role of [facility] discussed. Heart Failure Clinic new patient  appointment scheduled for April 30, 2017 at 1:20  p.m.   Roanna Epley, RN, BSN, [facility] Nurse Navigator

## 2017-04-25 NOTE — Progress Notes (Signed)
*  PRELIMINARY RESULTS* Echocardiogram 2D Echocardiogram has been performed.  Sherrie Sport 04/25/2017, 10:24 AM

## 2017-04-25 NOTE — Plan of Care (Signed)
Patient needs assistance with education about behavior compliance. Perform tteaching when girlfriend is available. Patient is not retaining information.

## 2017-04-25 NOTE — Care Management (Signed)
Patient was to have discharged today but echo showed that patient's previously normal EF is now reduced to 25 - 30%. Will require cardiac cath. Provided patient with set of scales.  [facility] but informed clinic not to fill when CM found that patient was not to discharge

## 2017-04-26 DIAGNOSIS — I5041 Acute combined systolic (congestive) and diastolic (congestive) heart failure: Secondary | ICD-10-CM

## 2017-04-26 DIAGNOSIS — I25110 Atherosclerotic heart disease of native coronary artery with unstable angina pectoris: Secondary | ICD-10-CM

## 2017-04-26 LAB — GLUCOSE, CAPILLARY
Glucose-Capillary: 177 mg/dL — ABNORMAL HIGH (ref 65–99)
Glucose-Capillary: 181 mg/dL — ABNORMAL HIGH (ref 65–99)
Glucose-Capillary: 183 mg/dL — ABNORMAL HIGH (ref 65–99)
Glucose-Capillary: 208 mg/dL — ABNORMAL HIGH (ref 65–99)
Glucose-Capillary: 265 mg/dL — ABNORMAL HIGH (ref 65–99)

## 2017-04-26 LAB — BASIC METABOLIC PANEL
Anion gap: 8 (ref 5–15)
BUN: 16 mg/dL (ref 6–20)
CO2: 29 mmol/L (ref 22–32)
Calcium: 9.1 mg/dL (ref 8.9–10.3)
Chloride: 99 mmol/L — ABNORMAL LOW (ref 101–111)
Creatinine, Ser: 0.88 mg/dL (ref 0.61–1.24)
GFR calc Af Amer: >60 mL/min (ref >60)
GFR calc non Af Amer: >60 mL/min (ref >60)
Glucose, Bld: 174 mg/dL — ABNORMAL HIGH (ref 65–99)
Potassium: 4.1 mmol/L (ref 3.5–5.1)
Sodium: 136 mmol/L (ref 135–145)

## 2017-04-26 MED ORDER — ALUM & MAG HYDROXIDE-SIMETH 200-200-20 MG/5ML PO SUSP
30.0000 mL | Freq: Two times a day (BID) | ORAL | Status: DC | PRN
  Administered 2017-04-26: 30 mL via ORAL
  Filled 2017-04-26: qty 30

## 2017-04-26 NOTE — Progress Notes (Signed)
Progress Note   Subjective   Doing well today, the patient denies CP.  Breathing is improved.   No new concerns  Inpatient Medications    Scheduled Meds: . aspirin EC  81 mg Oral Daily  . atorvastatin  40 mg Oral q1800  . carvedilol  3.125 mg Oral BID WC  . clonazePAM  1 mg Oral QHS  . clopidogrel  75 mg Oral Daily  . enoxaparin (LOVENOX) injection  40 mg Subcutaneous Q24H  . furosemide  40 mg Intravenous Daily  . gabapentin  800 mg Oral TID  . insulin aspart  0-15 Units Subcutaneous TID WC  . insulin aspart  0-5 Units Subcutaneous QHS  . insulin aspart  3 Units Subcutaneous TID WC  . insulin glargine  40 Units Subcutaneous QHS  . isosorbide mononitrate  30 mg Oral Daily  . lisinopril  10 mg Oral Daily  . mometasone-formoterol  2 puff Inhalation BID  . potassium chloride  10 mEq Oral Daily  . sodium chloride flush  3 mL Intravenous Q12H   Continuous Infusions: . sodium chloride     PRN Meds: sodium chloride, acetaminophen, albuterol, nitroGLYCERIN, ondansetron (ZOFRAN) IV, oxyCODONE-acetaminophen **AND** oxyCODONE, sodium chloride flush   Vital Signs    Vitals:   04/25/17 2055 04/26/17 0500 04/26/17 0509 04/26/17 0815  BP: 107/77  113/79 112/85  Pulse: 90  95 96  Resp: 18 18 18 18   Temp: 98.2 F (36.8 C) 97.7 F (36.5 C)  97.6 F (36.4 C)  TempSrc: Oral Oral  Oral  SpO2:   94% 97%  Weight:  234 lb 14.4 oz (106.5 kg)    Height:        Intake/Output Summary (Last 24 hours) at 04/26/2017 1323 Last data filed at 04/26/2017 0800 Gross per 24 hour  Intake 720 ml  Output 1175 ml  Net -455 ml   Filed Weights   04/24/17 0640 04/25/17 0430 04/26/17 0500  Weight: 232 lb 3.2 oz (105.3 kg) 233 lb 3.2 oz (105.8 kg) 234 lb 14.4 oz (106.5 kg)    Physical Exam   GEN- The patient is disheveled appearing, alert and oriented x 3 today.   Head- normocephalic, atraumatic Eyes-  Sclera clear, conjunctiva pink Ears- hearing intact Oropharynx- clear Neck-  supple, Lungs- Clear to ausculation bilaterally, normal work of breathing Heart- Regular rate and rhythm  GI- soft, NT, ND, + BS Extremities- no clubbing, cyanosis, or edema  MS- no significant deformity or atrophy Skin- no rash or lesion Psych- euthymic mood, full affect Neuro- strength and sensation are intact   Labs    Chemistry Recent Labs  Lab 04/24/17 0612 04/25/17 0410 04/26/17 0514  NA 137 136 136  K 3.9 3.7 4.1  CL 97* 96* 99*  CO2 31 31 29   GLUCOSE 98 144* 174*  BUN 16 17 16   CREATININE 0.94 0.91 0.88  CALCIUM 8.6* 8.5* 9.1  GFRNONAA >60 >60 >60  GFRAA >60 >60 >60  ANIONGAP 9 9 8      Hematology Recent Labs  Lab 04/22/17 1936 04/23/17 0409  WBC 10.2 7.6  RBC 5.24 5.05  HGB 12.6* 12.2*  HCT 39.2* 37.4*  MCV 74.7* 74.1*  MCH 24.0* 24.2*  MCHC 32.2 32.6  RDW 18.6* 18.8*  PLT 270 269    Cardiac Enzymes Recent Labs  Lab 04/22/17 1936 04/23/17 0409 04/23/17 1002 04/23/17 1553  TROPONINI 0.03* 0.04* 0.03* 0.03*   No results for input(s): TROPIPOC in the last 168 hours.  Assessment & Plan    1.  Acute systolic and diastolic CHF Worrisome for ischemia Planned for cath on Monday On good medical therapy currently Continue current therapy  2. CAD Known prior CAD Plan for cath on Monday as above  3. PAD Stable No change required today  4. COPD Stable No change required today  He has multiple vascular risk factors including CAD and PAD.  His long term risks of CV death, stroke, and MI are very high.  Lifestyle modification would be beneficial.   Thompson Grayer MD, [facility] 04/26/2017 1:23 PM

## 2017-04-26 NOTE — Progress Notes (Signed)
Asked pt does anything happen at home when gettting lantus 40 at night. Per pt says not taking as he should, but when he did take at times his blood sugar would drop very low. Nurse not give the lantus, notified MD

## 2017-04-26 NOTE — Progress Notes (Signed)
[place] at [place] NAME: [name] [name]    MR#:  [id number]  DATE OF BIRTH:  Jun 28, 1960  Breathing better than before, no chest pain.  Echocardiogram showed EF 20-25%.  Cardiology recommending left heart catheterization.  Patient is willing to stay until Monday to have a cardiac cath on Monday.   Patient here due to acute respiratory failure with hypoxia secondary to CHF.  Improving with diuresis.  Non-compliant with increasing salt intake and also tobacco abuse. No chest pain.    REVIEW OF SYSTEMS:    Review of Systems  Constitutional: Negative for chills and fever.  HENT: Negative for congestion and tinnitus.   Eyes: Negative for blurred vision and double vision.  Respiratory: Positive for shortness of breath. Negative for cough and wheezing.   Cardiovascular: Negative for chest pain, orthopnea and PND.  Gastrointestinal: Negative for abdominal pain, diarrhea, nausea and vomiting.  Genitourinary: Negative for dysuria and hematuria.  Neurological: Negative for dizziness, sensory change and focal weakness.  All other systems reviewed and are negative.   Nutrition: Heart Healthy/Carb control.  Tolerating Diet: Yes Tolerating PT: Await Eval.   DRUG ALLERGIES:   Allergies  Allergen Reactions  . Dulaglutide Anaphylaxis, Diarrhea and Hives    VITALS:  Blood pressure 112/85, pulse 96, temperature 97.6 F (36.4 C), temperature source Oral, resp. rate 18, height 5\' 6"  (1.676 m), weight 106.5 kg (234 lb 14.4 oz), SpO2 97 %.  PHYSICAL EXAMINATION:   Physical Exam  GENERAL:  56 y.o.-year-old obese patient lying in bed in no acute distress.  EYES: Pupils equal, round, reactive to light and accommodation. No scleral icterus. Extraocular muscles intact.  HEENT: Head atraumatic, normocephalic. Oropharynx and nasopharynx clear.  NECK:  Supple, no jugular venous distention. No thyroid enlargement, no tenderness.  LUNGS: Normal breath sounds  bilaterally, no wheezing, bibasilar rales, rhonchi. No use of accessory muscles of respiration.  CARDIOVASCULAR: S1, S2 normal. No murmurs, rubs, or gallops.  ABDOMEN: Soft, nontender, nondistended. Bowel sounds present. No organomegaly or mass.  EXTREMITIES: No cyanosis, clubbing, + 1 edema b/l   NEUROLOGIC: Cranial nerves II through XII are intact. No focal Motor or sensory deficits b/l.   PSYCHIATRIC: The patient is alert and oriented x 3.  SKIN: No obvious rash, lesion, or ulcer.    LABORATORY PANEL:   CBC Recent Labs  Lab 04/23/17 0409  WBC 7.6  HGB 12.2*  HCT 37.4*  PLT 269   ------------------------------------------------------------------------------------------------------------------  Chemistries  Recent Labs  Lab 04/26/17 0514  NA 136  K 4.1  CL 99*  CO2 29  GLUCOSE 174*  BUN 16  CREATININE 0.88  CALCIUM 9.1   ------------------------------------------------------------------------------------------------------------------  Cardiac Enzymes Recent Labs  Lab 04/23/17 1553  TROPONINI 0.03*   ------------------------------------------------------------------------------------------------------------------  RADIOLOGY:  No results found.   ASSESSMENT AND PLAN:   56 year old male with past medical history of diabetes, hypertension, hyperlipidemia, morbid obesity, peripheral vascular disease, chronic diastolic CHF, COPD, BPH who presents to the hospital due to shortness of breath and noted to be in acute respiratory failure with hypoxia secondary to CHF.  1. Acute respiratory failure with hypoxia-secondary to CHF.   -Currently better, O2 saturation 97% on room air,   2. CHF- echocardiogram this time showed EF 25-30% with diffuse hypokinesia, patient had previously normal echo in 2016.  Concerning decreased ejection fraction, recommend cardiac cath as discussed with the cardiologist Dr. Esmond Plants.  Patient willing to stay until Monday to have cardiac cath  on Monday./Continue aspirin, Plavix, lisinopril, Lasix, Coreg.  3. COPD-no acute exacerbation. Continue Dulera, DuoNeb nebs as needed. Acute bronchitis: Continue albuterol nebulizer, Dulera.  No pneumonia.  Improvement with  antibiotics.  Lungs are clear today.  4. Diabetes type 2 without complication- continue Lantus, daily NovoLog 3 units 3 times daily with meals, increase the Lantus to 45 units nightly.  5. Hyperlipidemia-continue atorvastatin.  6. Anxiety-continue Klonopin.  7. Neuropathy-continue gabapentin.    All the records are reviewed and case discussed with Care Management/Social Worker. Management plans discussed with the patient, family and they are in agreement.  CODE STATUS: Full code  DVT Prophylaxis: Lovenox  TOTAL TIME TAKING CARE OF THIS PATIENT: 30 minutes.   POSSIBLE D/C IN 2-3 DAYS, DEPENDING ON CLINICAL CONDITION.   Epifanio Lesches M.D on 04/26/2017 at 11:28 AM  Between 7am to 6pm - Pager - [phone]  After 6pm go to [email or web address] - [employer] [facility]  Office  [phone]  CC: Primary care physician; Clarisse Gouge, MD

## 2017-04-26 NOTE — Progress Notes (Signed)
Patient completely off unit. When (and if) patient returns, will have a discussion w/ patient r/t the importance of staying in room at all times unless with staff member. Will resume care upon eventual return. Wenda Low [facility]

## 2017-04-26 NOTE — Plan of Care (Signed)
  Progressing Education: Ability to demonstrate management of disease process will improve 04/26/2017 0202 - Progressing by Jeri Cos, RN Activity: Capacity to carry out activities will improve Description Pt encouraged to ambulate hallway daily.   04/26/2017 0202 - Progressing by Jeri Cos, RN Skin Integrity: Risk for impaired skin integrity will decrease 04/26/2017 0202 - Progressing by Jeri Cos, RN

## 2017-04-27 LAB — GLUCOSE, CAPILLARY
Glucose-Capillary: 249 mg/dL — ABNORMAL HIGH (ref 65–99)
Glucose-Capillary: 202 mg/dL — ABNORMAL HIGH (ref 65–99)
Glucose-Capillary: 184 mg/dL — ABNORMAL HIGH (ref 65–99)
Glucose-Capillary: 277 mg/dL — ABNORMAL HIGH (ref 65–99)

## 2017-04-27 LAB — BASIC METABOLIC PANEL
Anion gap: 9 (ref 5–15)
BUN: 19 mg/dL (ref 6–20)
CO2: 28 mmol/L (ref 22–32)
Calcium: 9 mg/dL (ref 8.9–10.3)
Chloride: 95 mmol/L — ABNORMAL LOW (ref 101–111)
Creatinine, Ser: 0.84 mg/dL (ref 0.61–1.24)
GFR calc Af Amer: >60 mL/min (ref >60)
GFR calc non Af Amer: >60 mL/min (ref >60)
Glucose, Bld: 205 mg/dL — ABNORMAL HIGH (ref 65–99)
Potassium: 5 mmol/L (ref 3.5–5.1)
Sodium: 132 mmol/L — ABNORMAL LOW (ref 135–145)

## 2017-04-27 MED ORDER — SODIUM CHLORIDE 0.9% FLUSH: 3.0000 mL | INTRAVENOUS | Status: DC | PRN

## 2017-04-27 MED ORDER — SODIUM CHLORIDE 0.9 % IV SOLN: 250.0000 mL | INTRAVENOUS | Status: DC | PRN

## 2017-04-27 MED ORDER — SODIUM CHLORIDE 0.9% FLUSH
3.0000 mL | Freq: Two times a day (BID) | INTRAVENOUS | Status: DC
  Administered 2017-04-27: 3 mL via INTRAVENOUS

## 2017-04-27 MED ORDER — SODIUM CHLORIDE 0.9 % WEIGHT BASED INFUSION
3.0000 mL/kg/h | INTRAVENOUS | Status: DC
  Administered 2017-04-28: 3 mL/kg/h via INTRAVENOUS

## 2017-04-27 MED ORDER — SODIUM CHLORIDE 0.9 % WEIGHT BASED INFUSION
1.0000 mL/kg/h | INTRAVENOUS | Status: DC
  Administered 2017-04-28: 1 mL/kg/h via INTRAVENOUS

## 2017-04-27 MED ORDER — ASPIRIN 81 MG PO CHEW
81.0000 mg | CHEWABLE_TABLET | ORAL | Status: AC
  Administered 2017-04-28: 81 mg via ORAL
  Filled 2017-04-27: qty 1

## 2017-04-27 MED ORDER — MORPHINE SULFATE (PF) 2 MG/ML IV SOLN
2.0000 mg | Freq: Four times a day (QID) | INTRAVENOUS | Status: DC | PRN
  Administered 2017-04-27: 2 mg via INTRAVENOUS
  Filled 2017-04-27: qty 1

## 2017-04-27 MED ORDER — TRAZODONE HCL 50 MG PO TABS
50.0000 mg | ORAL_TABLET | Freq: Every evening | ORAL | Status: DC | PRN
  Administered 2017-04-27: 50 mg via ORAL
  Filled 2017-04-27: qty 1

## 2017-04-27 MED ORDER — SODIUM CHLORIDE 0.9 % IV SOLN
INTRAVENOUS | Status: DC
  Administered 2017-04-28: 06:00:00 via INTRAVENOUS

## 2017-04-27 MED ORDER — INSULIN GLARGINE 100 UNIT/ML ~~LOC~~ SOLN
20.0000 [IU] | Freq: Every day | SUBCUTANEOUS | Status: DC
  Administered 2017-04-27: 20 [IU] via SUBCUTANEOUS
  Filled 2017-04-27 (×2): qty 0.2

## 2017-04-27 NOTE — Progress Notes (Signed)
Progress Note   Subjective   Doing well today, the patient denies CP or SOB.  Primary concern today is with pain from diabetic foot ulcer.  Inpatient Medications    Scheduled Meds: . aspirin EC  81 mg Oral Daily  . atorvastatin  40 mg Oral q1800  . carvedilol  3.125 mg Oral BID WC  . clonazePAM  1 mg Oral QHS  . clopidogrel  75 mg Oral Daily  . enoxaparin (LOVENOX) injection  40 mg Subcutaneous Q24H  . furosemide  40 mg Intravenous Daily  . gabapentin  800 mg Oral TID  . insulin aspart  0-15 Units Subcutaneous TID WC  . insulin aspart  0-5 Units Subcutaneous QHS  . insulin aspart  3 Units Subcutaneous TID WC  . insulin glargine  20 Units Subcutaneous QHS  . isosorbide mononitrate  30 mg Oral Daily  . lisinopril  10 mg Oral Daily  . mometasone-formoterol  2 puff Inhalation BID  . potassium chloride  10 mEq Oral Daily  . sodium chloride flush  3 mL Intravenous Q12H   Continuous Infusions: . sodium chloride     PRN Meds: sodium chloride, acetaminophen, albuterol, alum & mag hydroxide-simeth, nitroGLYCERIN, ondansetron (ZOFRAN) IV, oxyCODONE-acetaminophen **AND** oxyCODONE, sodium chloride flush   Vital Signs    Vitals:   04/26/17 0815 04/26/17 1548 04/26/17 2047 04/27/17 0344  BP: 112/85 113/71 114/69 (!) 153/64  Pulse: 96 94 91 90  Resp: 18 18 17 18   Temp: 97.6 F (36.4 C) 98.4 F (36.9 C) 98.7 F (37.1 C) (!) 97.3 F (36.3 C)  TempSrc: Oral Oral  Oral  SpO2: 97% 97% 95% 97%  Weight:    236 lb 12.8 oz (107.4 kg)  Height:        Intake/Output Summary (Last 24 hours) at 04/27/2017 1415 Last data filed at 04/27/2017 1034 Gross per 24 hour  Intake 480 ml  Output 1450 ml  Net -970 ml   Filed Weights   04/25/17 0430 04/26/17 0500 04/27/17 0344  Weight: 233 lb 3.2 oz (105.8 kg) 234 lb 14.4 oz (106.5 kg) 236 lb 12.8 oz (107.4 kg)    Physical Exam   GEN- The patient is chronically ill appearing, alert and oriented x 3 today.   Head- normocephalic,  atraumatic Eyes-  Sclera clear, conjunctiva pink Ears- hearing intact Oropharynx- clear Neck- supple, Lungs- Clear to ausculation bilaterally, normal work of breathing Heart- Regular rate and rhythm  GI- soft, NT, ND, + BS Extremities- no clubbing, cyanosis, or edema  MS- no significant deformity or atrophy Skin- no rash or lesion Psych- euthymic mood, full affect Neuro- strength and sensation are intact   Labs    Chemistry Recent Labs  Lab 04/25/17 0410 04/26/17 0514 04/27/17 0604  NA 136 136 132*  K 3.7 4.1 5.0  CL 96* 99* 95*  CO2 31 29 28   GLUCOSE 144* 174* 205*  BUN 17 16 19   CREATININE 0.91 0.88 0.84  CALCIUM 8.5* 9.1 9.0  GFRNONAA >60 >60 >60  GFRAA >60 >60 >60  ANIONGAP 9 8 9      Hematology Recent Labs  Lab 04/22/17 1936 04/23/17 0409  WBC 10.2 7.6  RBC 5.24 5.05  HGB 12.6* 12.2*  HCT 39.2* 37.4*  MCV 74.7* 74.1*  MCH 24.0* 24.2*  MCHC 32.2 32.6  RDW 18.6* 18.8*  PLT 270 269    Cardiac Enzymes Recent Labs  Lab 04/22/17 1936 04/23/17 0409 04/23/17 1002 04/23/17 1553  TROPONINI 0.03* 0.04* 0.03* 0.03*  No results for input(s): TROPIPOC in the last 168 hours.      Assessment & Plan    1.  Acute systolic and diastolic CHF Concerning for ischemia Cath planned for tomorrow Orders for cath placed Continue current medical therapy  2. CAD Known CAD We discussed cath at length today.  He wishes to proceed Orders placed  3. COPD Stable No change required today  4. PAD Stable No change required today   Thompson Grayer MD, [facility] 04/27/2017 2:15 PM

## 2017-04-27 NOTE — Plan of Care (Signed)
  Progressing Pain Managment: General experience of comfort will improve 04/27/2017 0051 - Progressing by Jeri Cos, RN

## 2017-04-27 NOTE — Progress Notes (Signed)
Pt complains of 8/10 pain after prn oxycodone. MD notified for break thru pain medication. Orders for morphine received. I will continue to assess.

## 2017-04-27 NOTE — Progress Notes (Signed)
[place] at [place] NAME: [name] [name]    MR#:  [id number]  DATE OF BIRTH:  04-30-1961  No new complaints, for cardiac cath tomorrow.   Patient here due to acute respiratory failure with hypoxia secondary to CHF.  Improving with diuresis.  Non-compliant with increasing salt intake and also tobacco abuse. No chest pain.    REVIEW OF SYSTEMS:    Review of Systems  Constitutional: Negative for chills and fever.  HENT: Negative for congestion and tinnitus.   Eyes: Negative for blurred vision and double vision.  Respiratory: Positive for shortness of breath. Negative for cough and wheezing.   Cardiovascular: Negative for chest pain, orthopnea and PND.  Gastrointestinal: Negative for abdominal pain, diarrhea, nausea and vomiting.  Genitourinary: Negative for dysuria and hematuria.  Neurological: Negative for dizziness, sensory change and focal weakness.  All other systems reviewed and are negative.   Nutrition: Heart Healthy/Carb control.  Tolerating Diet: Yes Tolerating PT: Await Eval.   DRUG ALLERGIES:   Allergies  Allergen Reactions  . Dulaglutide Anaphylaxis, Diarrhea and Hives    VITALS:  Blood pressure (!) 153/64, pulse 90, temperature (!) 97.3 F (36.3 C), temperature source Oral, resp. rate 18, height 5\' 6"  (1.676 m), weight 107.4 kg (236 lb 12.8 oz), SpO2 97 %.  PHYSICAL EXAMINATION:   Physical Exam  GENERAL:  56 y.o.-year-old obese patient lying in bed in no acute distress.  EYES: Pupils equal, round, reactive to light and accommodation. No scleral icterus. Extraocular muscles intact.  HEENT: Head atraumatic, normocephalic. Oropharynx and nasopharynx clear.  NECK:  Supple, no jugular venous distention. No thyroid enlargement, no tenderness.  LUNGS: Normal breath sounds bilaterally, no wheezing, bibasilar rales, rhonchi. No use of accessory muscles of respiration.  CARDIOVASCULAR: S1, S2 normal. No murmurs, rubs, or gallops.   ABDOMEN: Soft, nontender, nondistended. Bowel sounds present. No organomegaly or mass.  EXTREMITIES: No cyanosis, clubbing, + 1 edema b/l   NEUROLOGIC: Cranial nerves II through XII are intact. No focal Motor or sensory deficits b/l.   PSYCHIATRIC: The patient is alert and oriented x 3.  SKIN: No obvious rash, lesion, or ulcer.    LABORATORY PANEL:   CBC Recent Labs  Lab 04/23/17 0409  WBC 7.6  HGB 12.2*  HCT 37.4*  PLT 269   ------------------------------------------------------------------------------------------------------------------  Chemistries  Recent Labs  Lab 04/27/17 0604  NA 132*  K 5.0  CL 95*  CO2 28  GLUCOSE 205*  BUN 19  CREATININE 0.84  CALCIUM 9.0   ------------------------------------------------------------------------------------------------------------------  Cardiac Enzymes Recent Labs  Lab 04/23/17 1553  TROPONINI 0.03*   ------------------------------------------------------------------------------------------------------------------  RADIOLOGY:  No results found.   ASSESSMENT AND PLAN:   56 year old male with past medical history of diabetes, hypertension, hyperlipidemia, morbid obesity, peripheral vascular disease, chronic diastolic CHF, COPD, BPH who presents to the hospital due to shortness of breath and noted to be in acute respiratory failure with hypoxia secondary to CHF.  1. Acute respiratory failure with hypoxia-secondary to CHF.   -Currently better, O2 saturation 97% on room air,    2. CHF- echocardiogram this time showed EF 25-30% with diffuse hypokinesia, patient had previously normal echo in 2016.  Concerning decreased ejection fraction, recommend cardiac cath as discussed with the cardiologist Dr. Esmond Plants.  Plan for cardiac cath tomorrow, continue aspirin, Plavix, lisinopril, Lasix, Coreg.  3. COPD-no acute exacerbation. Continue Dulera, DuoNeb nebs as needed.  Acute bronchitis: Continue albuterol nebulizer,  Dulera.  No pneumonia.  Improvement with  antibiotics.  Lungs are clear today.  4. Diabetes type 2 without complication- continue half dose of Lantus tonight.daily NovoLog 3 units 3 times daily with meals,   5. Hyperlipidemia-continue atorvastatin.  6. Anxiety-continue Klonopin.  7. Neuropathy-continue gabapentin. D/w patient  All the records are reviewed and case discussed with Care Management/Social Worker. Management plans discussed with the patient, family and they are in agreement.  CODE STATUS: Full code  DVT Prophylaxis: Lovenox  TOTAL TIME TAKING CARE OF THIS PATIENT: 30 minutes.   POSSIBLE D/C IN 2-3 DAYS, DEPENDING ON CLINICAL CONDITION.   Epifanio Lesches M.D on 04/27/2017 at 12:56 PM  Between 7am to 6pm - Pager - [phone]  After 6pm go to [email or web address] - [employer] [facility]  Office  [phone]  CC: Primary care physician; Clarisse Gouge, MD

## 2017-04-27 NOTE — H&P (View-Only) (Signed)
Progress Note   Subjective   Doing well today, the patient denies CP or SOB.  Primary concern today is with pain from diabetic foot ulcer.  Inpatient Medications    Scheduled Meds: . aspirin EC  81 mg Oral Daily  . atorvastatin  40 mg Oral q1800  . carvedilol  3.125 mg Oral BID WC  . clonazePAM  1 mg Oral QHS  . clopidogrel  75 mg Oral Daily  . enoxaparin (LOVENOX) injection  40 mg Subcutaneous Q24H  . furosemide  40 mg Intravenous Daily  . gabapentin  800 mg Oral TID  . insulin aspart  0-15 Units Subcutaneous TID WC  . insulin aspart  0-5 Units Subcutaneous QHS  . insulin aspart  3 Units Subcutaneous TID WC  . insulin glargine  20 Units Subcutaneous QHS  . isosorbide mononitrate  30 mg Oral Daily  . lisinopril  10 mg Oral Daily  . mometasone-formoterol  2 puff Inhalation BID  . potassium chloride  10 mEq Oral Daily  . sodium chloride flush  3 mL Intravenous Q12H   Continuous Infusions: . sodium chloride     PRN Meds: sodium chloride, acetaminophen, albuterol, alum & mag hydroxide-simeth, nitroGLYCERIN, ondansetron (ZOFRAN) IV, oxyCODONE-acetaminophen **AND** oxyCODONE, sodium chloride flush   Vital Signs    Vitals:   04/26/17 0815 04/26/17 1548 04/26/17 2047 04/27/17 0344  BP: 112/85 113/71 114/69 (!) 153/64  Pulse: 96 94 91 90  Resp: 18 18 17 18   Temp: 97.6 F (36.4 C) 98.4 F (36.9 C) 98.7 F (37.1 C) (!) 97.3 F (36.3 C)  TempSrc: Oral Oral  Oral  SpO2: 97% 97% 95% 97%  Weight:    236 lb 12.8 oz (107.4 kg)  Height:        Intake/Output Summary (Last 24 hours) at 04/27/2017 1415 Last data filed at 04/27/2017 1034 Gross per 24 hour  Intake 480 ml  Output 1450 ml  Net -970 ml   Filed Weights   04/25/17 0430 04/26/17 0500 04/27/17 0344  Weight: 233 lb 3.2 oz (105.8 kg) 234 lb 14.4 oz (106.5 kg) 236 lb 12.8 oz (107.4 kg)    Physical Exam   GEN- The patient is chronically ill appearing, alert and oriented x 3 today.   Head- normocephalic,  atraumatic Eyes-  Sclera clear, conjunctiva pink Ears- hearing intact Oropharynx- clear Neck- supple, Lungs- Clear to ausculation bilaterally, normal work of breathing Heart- Regular rate and rhythm  GI- soft, NT, ND, + BS Extremities- no clubbing, cyanosis, or edema  MS- no significant deformity or atrophy Skin- no rash or lesion Psych- euthymic mood, full affect Neuro- strength and sensation are intact   Labs    Chemistry Recent Labs  Lab 04/25/17 0410 04/26/17 0514 04/27/17 0604  NA 136 136 132*  K 3.7 4.1 5.0  CL 96* 99* 95*  CO2 31 29 28   GLUCOSE 144* 174* 205*  BUN 17 16 19   CREATININE 0.91 0.88 0.84  CALCIUM 8.5* 9.1 9.0  GFRNONAA >60 >60 >60  GFRAA >60 >60 >60  ANIONGAP 9 8 9      Hematology Recent Labs  Lab 04/22/17 1936 04/23/17 0409  WBC 10.2 7.6  RBC 5.24 5.05  HGB 12.6* 12.2*  HCT 39.2* 37.4*  MCV 74.7* 74.1*  MCH 24.0* 24.2*  MCHC 32.2 32.6  RDW 18.6* 18.8*  PLT 270 269    Cardiac Enzymes Recent Labs  Lab 04/22/17 1936 04/23/17 0409 04/23/17 1002 04/23/17 1553  TROPONINI 0.03* 0.04* 0.03* 0.03*  No results for input(s): TROPIPOC in the last 168 hours.      Assessment & Plan    1.  Acute systolic and diastolic CHF Concerning for ischemia Cath planned for tomorrow Orders for cath placed Continue current medical therapy  2. CAD Known CAD We discussed cath at length today.  He wishes to proceed Orders placed  3. COPD Stable No change required today  4. PAD Stable No change required today   Thompson Grayer MD, [facility] 04/27/2017 2:15 PM

## 2017-04-28 ENCOUNTER — Encounter: Payer: Self-pay | Admitting: *Deleted

## 2017-04-28 ENCOUNTER — Encounter
Admission: EM | Disposition: A | Discharge status: Home / Self Care | Payer: Self-pay | Source: Home / Self Care | Attending: Internal Medicine

## 2017-04-28 DIAGNOSIS — I5023 Acute on chronic systolic (congestive) heart failure: Secondary | ICD-10-CM

## 2017-04-28 DIAGNOSIS — I509 Heart failure, unspecified: Secondary | ICD-10-CM

## 2017-04-28 HISTORY — PX: LEFT HEART CATH AND CORS/GRAFTS ANGIOGRAPHY: CATH118250

## 2017-04-28 LAB — BASIC METABOLIC PANEL
Anion gap: 8 (ref 5–15)
BUN: 24 mg/dL — ABNORMAL HIGH (ref 6–20)
CO2: 27 mmol/L (ref 22–32)
Calcium: 9 mg/dL (ref 8.9–10.3)
Chloride: 93 mmol/L — ABNORMAL LOW (ref 101–111)
Creatinine, Ser: 1.05 mg/dL (ref 0.61–1.24)
GFR calc Af Amer: >60 mL/min (ref >60)
GFR calc non Af Amer: >60 mL/min (ref >60)
Glucose, Bld: 185 mg/dL — ABNORMAL HIGH (ref 65–99)
Potassium: 5.2 mmol/L — ABNORMAL HIGH (ref 3.5–5.1)
Sodium: 128 mmol/L — ABNORMAL LOW (ref 135–145)

## 2017-04-28 LAB — GLUCOSE, CAPILLARY
Glucose-Capillary: 186 mg/dL — ABNORMAL HIGH (ref 65–99)
Glucose-Capillary: 210 mg/dL — ABNORMAL HIGH (ref 65–99)
Glucose-Capillary: 231 mg/dL — ABNORMAL HIGH (ref 65–99)

## 2017-04-28 SURGERY — LEFT HEART CATH AND CORS/GRAFTS ANGIOGRAPHY
Anesthesia: Moderate Sedation

## 2017-04-28 MED ORDER — LIDOCAINE HCL (PF) 1 % IJ SOLN
INTRAMUSCULAR | Status: DC | PRN
  Administered 2017-04-28: 5 mL

## 2017-04-28 MED ORDER — MIDAZOLAM HCL 2 MG/2ML IJ SOLN
INTRAMUSCULAR | Status: AC
  Filled 2017-04-28: qty 2

## 2017-04-28 MED ORDER — SODIUM CHLORIDE 0.9 % IV SOLN: 250.0000 mL | INTRAVENOUS | Status: DC | PRN

## 2017-04-28 MED ORDER — MIDAZOLAM HCL 2 MG/2ML IJ SOLN
INTRAMUSCULAR | Status: DC | PRN
  Administered 2017-04-28: 1 mg via INTRAVENOUS

## 2017-04-28 MED ORDER — FUROSEMIDE 10 MG/ML IJ SOLN
40.0000 mg | Freq: Two times a day (BID) | INTRAMUSCULAR | Status: DC
  Administered 2017-04-28: 40 mg via INTRAVENOUS
  Filled 2017-04-28: qty 4

## 2017-04-28 MED ORDER — FENTANYL CITRATE (PF) 100 MCG/2ML IJ SOLN
INTRAMUSCULAR | Status: DC | PRN
  Administered 2017-04-28: 25 ug via INTRAVENOUS

## 2017-04-28 MED ORDER — LIDOCAINE HCL (PF) 1 % IJ SOLN
INTRAMUSCULAR | Status: AC
  Filled 2017-04-28: qty 30

## 2017-04-28 MED ORDER — HEPARIN SODIUM (PORCINE) 1000 UNIT/ML IJ SOLN
INTRAMUSCULAR | Status: AC
  Filled 2017-04-28: qty 1

## 2017-04-28 MED ORDER — INSULIN GLARGINE 100 UNIT/ML ~~LOC~~ SOLN
40.0000 [IU] | Freq: Every day | SUBCUTANEOUS | Status: DC
  Administered 2017-04-28: 40 [IU] via SUBCUTANEOUS
  Filled 2017-04-28 (×2): qty 0.4

## 2017-04-28 MED ORDER — FENTANYL CITRATE (PF) 100 MCG/2ML IJ SOLN
INTRAMUSCULAR | Status: AC
  Filled 2017-04-28: qty 2

## 2017-04-28 MED ORDER — SODIUM CHLORIDE 0.9% FLUSH: 3.0000 mL | INTRAVENOUS | Status: DC | PRN

## 2017-04-28 MED ORDER — VERAPAMIL HCL 2.5 MG/ML IV SOLN
INTRAVENOUS | Status: AC
  Filled 2017-04-28: qty 2

## 2017-04-28 MED ORDER — HEPARIN (PORCINE) IN NACL 2-0.9 UNIT/ML-% IJ SOLN
INTRAMUSCULAR | Status: AC
  Filled 2017-04-28: qty 1000

## 2017-04-28 MED ORDER — INSULIN ASPART 100 UNIT/ML ~~LOC~~ SOLN
5.0000 [IU] | Freq: Three times a day (TID) | SUBCUTANEOUS | Status: DC
  Administered 2017-04-28: 5 [IU] via SUBCUTANEOUS
  Filled 2017-04-28: qty 1

## 2017-04-28 MED ORDER — SODIUM CHLORIDE 0.9% FLUSH
3.0000 mL | Freq: Two times a day (BID) | INTRAVENOUS | Status: DC
  Administered 2017-04-28: 3 mL via INTRAVENOUS

## 2017-04-28 SURGICAL SUPPLY — 7 items
CATH INFINITI 5 FR JL3.5 (CATHETERS) ×2 IMPLANT
CATH OPTITORQUE JACKY 4.0 5F (CATHETERS) ×2 IMPLANT
DEVICE RAD COMP TR BAND LRG (VASCULAR PRODUCTS) ×2 IMPLANT
GLIDESHEATH SLEND SS 6F .021 (SHEATH) ×2 IMPLANT
KIT MANI 3VAL PERCEP (MISCELLANEOUS) ×2 IMPLANT
PACK CARDIAC CATH (CUSTOM PROCEDURE TRAY) ×2 IMPLANT
WIRE ROSEN-J .035X260CM (WIRE) ×2 IMPLANT

## 2017-04-28 NOTE — Progress Notes (Signed)
[facility] [place] NAME: Lejon Afzal    MR#:  [id number]  DATE OF BIRTH:  [birth year]  SUBJECTIVE:  CHIEF COMPLAINT:   Chief Complaint  Patient presents with  . Shortness of Breath  no complaints, sp cath REVIEW OF SYSTEMS:  Review of Systems  Constitutional: Negative for chills, fever and weight loss.  HENT: Negative for nosebleeds and sore throat.   Eyes: Negative for blurred vision.  Respiratory: Negative for cough, shortness of breath and wheezing.   Cardiovascular: Negative for chest pain, orthopnea, leg swelling and PND.  Gastrointestinal: Negative for abdominal pain, constipation, diarrhea, heartburn, nausea and vomiting.  Genitourinary: Negative for dysuria and urgency.  Musculoskeletal: Negative for back pain.  Skin: Negative for rash.  Neurological: Negative for dizziness, speech change, focal weakness and headaches.  Endo/Heme/Allergies: Does not bruise/bleed easily.  Psychiatric/Behavioral: Negative for depression.    DRUG ALLERGIES:   Allergies  Allergen Reactions  . Dulaglutide Anaphylaxis, Diarrhea and Hives   VITALS:  Blood pressure 115/79, pulse 98, temperature 98.5 F (36.9 C), temperature source Oral, resp. rate 20, height 5\' 6"  (1.676 m), weight 64.6 kg (142 lb 6.4 oz), SpO2 97 %. PHYSICAL EXAMINATION:  Physical Exam  Constitutional: He is oriented to person, place, and time and well-developed, well-nourished, and in no distress.  HENT:  Head: Normocephalic and atraumatic.  Eyes: Conjunctivae and EOM are normal. Pupils are equal, round, and reactive to light.  Neck: Normal range of motion. Neck supple. No tracheal deviation present. No thyromegaly present.  Cardiovascular: Normal rate, regular rhythm and normal heart sounds.  Pulmonary/Chest: Effort normal and breath sounds normal. No respiratory distress. He has no wheezes. He exhibits no tenderness.  Abdominal: Soft. Bowel sounds are normal. He exhibits  no distension. There is no tenderness.  Musculoskeletal: Normal range of motion.  Neurological: He is alert and oriented to person, place, and time. No cranial nerve deficit.  Skin: Skin is warm and dry. No rash noted.  Psychiatric: Mood and affect normal.   LABORATORY PANEL:  Male CBC Recent Labs  Lab 04/23/17 0409  WBC 7.6  HGB 12.2*  HCT 37.4*  PLT 269   ------------------------------------------------------------------------------------------------------------------ Chemistries  Recent Labs  Lab 04/28/17 0557  NA 128*  K 5.2*  CL 93*  CO2 27  GLUCOSE 185*  BUN 24*  CREATININE 1.05  CALCIUM 9.0   RADIOLOGY:  No results found. ASSESSMENT AND PLAN:  56 year old male with past medical history of diabetes, hypertension, hyperlipidemia, morbid obesity, peripheral vascular disease, chronic diastolic CHF, COPD, BPH who presents to the hospital due to shortness of breath and noted to be in acute respiratory failure with hypoxia secondary to CHF.  1. Acute respiratory failure with hypoxia-secondary to CHF.   -Currently better, O2 saturation 97% on room air  2. Acute on chronic systolic CHF- Due to underlying CAD, echocardiogram this time showed EF 25-30% with diffuse hypokinesia, patient had previously normal echo in 2016.   - s/p cath -> showed patent LAD and RCA stents with underlying diffuse three-vessel coronary artery disease.   * Coronary artery disease:  - Medical mgmt recommended - continue aspirin, Plavix, lisinopril, Lasix, Coreg.  3. COPD-no acute exacerbation. Continue Dulera, DuoNeb nebs as needed.  Acute bronchitis: Continue albuterol nebulizer, Dulera.  No pneumonia.  Improvement with  antibiotics.  Lungs are clear today.  4. Diabetes type 2 without complication- continue half dose of Lantus tonight.daily NovoLog 3 units 3 times daily with meals,  5. Hyperlipidemia-continue atorvastatin.  6. Anxiety-continue Klonopin.  7. Neuropathy-continue  gabapentin.      All the records are reviewed and case discussed with Care Management/Social Worker. Management plans discussed with the patient, nursing and they are in agreement.  CODE STATUS: Full Code  TOTAL TIME TAKING CARE OF THIS PATIENT: 35 minutes.   More than 50% of the time was spent in counseling/coordination of care: YES  POSSIBLE D/C IN 1 DAYS, DEPENDING ON CLINICAL CONDITION.   Max Sane M.D on 04/28/2017 at 7:52 PM  Between 7am to 6pm - Pager - [phone]  After 6pm go to [email or web address] - Technical brewer [facility]  Office  [phone]  CC: Primary care physician; Clarisse Gouge, MD  Note: This dictation was prepared with Dragon dictation along with smaller phrase technology. Any transcriptional errors that result from this process are unintentional.

## 2017-04-28 NOTE — Progress Notes (Signed)
Inpatient Diabetes Program Recommendations  AACE/ADA: New Consensus Statement on Inpatient Glycemic Control (2015)  Target Ranges:  Prepandial:   less than 140 mg/dL      Peak postprandial:   less than 180 mg/dL (1-2 hours)      Critically ill patients:  140 - 180 mg/dL   Lab Results  Component Value Date   GLUCAP 210 (H) 04/28/2017   HGBA1C 8.7 (H) 04/23/2017   Review of Glycemic ControlResults for TIERRA, DIVELBISS (MRN [id number]) as of 04/28/2017 13:11  Ref. Range 04/27/2017 11:59 04/27/2017 16:53 04/27/2017 21:39 04/28/2017 07:25 04/28/2017 11:17  Glucose-Capillary Latest Ref Range: 65 - 99 mg/dL 202 (H) 184 (H) 277 (H) 186 (H) 210 (H)   Diabetes history: Type 2 Outpatient Diabetes medications:Glipizide 10mg  qam, Humalog 0-10 units tid, Glucophage 1000mg  bid, Lantus 80 units daily * has not taken any diabetes medication for 3 weeks Current orders for Inpatient glycemic control:  Lantus 20 units q HS, Novolog 3 units tid with meals, Novolog moderate tid with meals and HS Inpatient Diabetes Program Recommendations:    Consider increasing Lantus back to 40 units daily.  Also consider increasing Novolog meal coverage to 5 units tid with meals.   Thanks, Adah Perl, RN, BC-ADM Inpatient Diabetes Coordinator Pager [phone] (8a-5p)

## 2017-04-28 NOTE — Plan of Care (Signed)
  Progressing Coping: Level of anxiety will decrease 04/28/2017 0243 - Progressing by Jeri Cos, RN

## 2017-04-28 NOTE — Progress Notes (Signed)
Vitals:   04/28/17 1000 04/28/17 1118  BP: 101/64 (!) 87/63  Pulse: 83 90  Resp:    Temp:  98.5 F (36.9 C)  SpO2: 92% 93%   Patients bp soft Dr. Manuella Ghazi notified orders received to hold all bp medicine.

## 2017-04-28 NOTE — Progress Notes (Signed)
Progress Note  Patient Name: [name] [name] Date of Encounter: 04/28/2017  Primary Cardiologist: Fletcher Anon   Subjective   No chest pain.  Stable dyspnea.  Cardiac catheterization showed patent LAD and RCA stents with underlying diffuse three-vessel coronary artery disease.  LVEDP was 34 mmHg.  Inpatient Medications    Scheduled Meds: . aspirin EC  81 mg Oral Daily  . atorvastatin  40 mg Oral q1800  . carvedilol  3.125 mg Oral BID WC  . clonazePAM  1 mg Oral QHS  . clopidogrel  75 mg Oral Daily  . enoxaparin (LOVENOX) injection  40 mg Subcutaneous Q24H  . furosemide  40 mg Intravenous BID  . gabapentin  800 mg Oral TID  . insulin aspart  0-15 Units Subcutaneous TID WC  . insulin aspart  0-5 Units Subcutaneous QHS  . insulin aspart  3 Units Subcutaneous TID WC  . insulin glargine  20 Units Subcutaneous QHS  . isosorbide mononitrate  30 mg Oral Daily  . lisinopril  10 mg Oral Daily  . mometasone-formoterol  2 puff Inhalation BID  . potassium chloride  10 mEq Oral Daily  . sodium chloride flush  3 mL Intravenous Q12H  . sodium chloride flush  3 mL Intravenous Q12H   Continuous Infusions: . sodium chloride    . sodium chloride     PRN Meds: sodium chloride, sodium chloride, acetaminophen, albuterol, alum & mag hydroxide-simeth, morphine injection, nitroGLYCERIN, ondansetron (ZOFRAN) IV, oxyCODONE-acetaminophen **AND** oxyCODONE, sodium chloride flush, sodium chloride flush, traZODone   Vital Signs    Vitals:   04/28/17 0915 04/28/17 0930 04/28/17 0945 04/28/17 1000  BP: 103/71 (!) 89/60  101/64  Pulse: 91 86 86 83  Resp: 19 18 17    Temp:      TempSrc:      SpO2: 92% 92% 92% 92%  Weight:      Height:        Intake/Output Summary (Last 24 hours) at 04/28/2017 1112 Last data filed at 04/28/2017 1000 Gross per 24 hour  Intake 240 ml  Output 5350 ml  Net -5110 ml   Filed Weights   04/26/17 0500 04/27/17 0344 04/28/17 0559  Weight: 234 lb 14.4 oz (106.5 kg) 236 lb  12.8 oz (107.4 kg) 142 lb 6.4 oz (64.6 kg)    Telemetry    Normal sinus rhythm- Personally Reviewed  ECG     Physical Exam   GEN: No acute distress.   Neck: No JVD Cardiac: RRR, no murmurs, rubs, or gallops.  Respiratory: Clear to auscultation bilaterally. GI: Soft, nontender, non-distended  MS: No edema; No deformity. Neuro:  Nonfocal  Psych: Normal affect   Labs    Chemistry Recent Labs  Lab 04/26/17 0514 04/27/17 0604 04/28/17 0557  NA 136 132* 128*  K 4.1 5.0 5.2*  CL 99* 95* 93*  CO2 29 28 27   GLUCOSE 174* 205* 185*  BUN 16 19 24*  CREATININE 0.88 0.84 1.05  CALCIUM 9.1 9.0 9.0  GFRNONAA >60 >60 >60  GFRAA >60 >60 >60  ANIONGAP 8 9 8      Hematology Recent Labs  Lab 04/22/17 1936 04/23/17 0409  WBC 10.2 7.6  RBC 5.24 5.05  HGB 12.6* 12.2*  HCT 39.2* 37.4*  MCV 74.7* 74.1*  MCH 24.0* 24.2*  MCHC 32.2 32.6  RDW 18.6* 18.8*  PLT 270 269    Cardiac Enzymes Recent Labs  Lab 04/22/17 1936 04/23/17 0409 04/23/17 1002 04/23/17 1553  TROPONINI 0.03* 0.04* 0.03* 0.03*  No results for input(s): TROPIPOC in the last 168 hours.   BNP Recent Labs  Lab 04/22/17 1936  BNP 1,528.0*     DDimer No results for input(s): DDIMER in the last 168 hours.   Radiology    No results found.  Cardiac Studies   Echo: - Left ventricle: The cavity size was normal. Systolic function was   severely reduced. The estimated ejection fraction was in the   range of 25% to 30%. Diffuse hypokinesis. Regional wall motion   abnormalities cannot be excluded. Features are consistent with a   pseudonormal left ventricular filling pattern, with concomitant   abnormal relaxation and increased filling pressure (grade 2   diastolic dysfunction). - Left atrium: The atrium was moderately dilated. - Right ventricle: The cavity size was mildly dilated. Wall   thickness was normal. Systolic function was mildly reduced. - Pulmonary arteries: PA peak pressure: 34 mm Hg  (S).     Patient Profile     56 y.o. male with known history of chronic systolic heart failure, coronary artery disease with previous stenting, COPD, tobacco use and peripheral arterial disease who presented with acute on chronic systolic heart failure and was found to have worsening LV systolic function with an ejection fraction of 25%.  Assessment & Plan    1.  Acute on chronic systolic heart failure: Likely due to underlying diffuse three-vessel coronary artery disease.  The patient was noted to be significantly volume overloaded during cardiac catheterization with left ventricular end-diastolic pressure of 34 mmHg.  This probably was worsened by precath hydration.  I am going to diurese him with furosemide 40 mg intravenously twice daily today.  Can switch to oral furosemide tomorrow.  Continue carvedilol and lisinopril.  2.  Coronary artery disease: He has suboptimal targets for CABG but that might be his best option given that he is diabetic with cardiomyopathy. He does not have critical disease that needs to be addressed right away.  I recommend outpatient evaluation by cardiothoracic surgery for possible CABG.  In the meanwhile, continue medical therapy with aspirin and Plavix as well as long-acting nitroglycerin.  Plavix can be discontinued in the future if CABG is planned.  Possible discharge home tomorrow if he remains stable.   For questions or updates, please contact [place] Please consult [email or web address] for contact info under Cardiology/STEMI.      Signed, [name], MD  04/28/2017, 11:12 AM

## 2017-04-28 NOTE — Interval H&P Note (Signed)
History and Physical Interval Note:  04/28/2017 8:01 AM  [name] [name]  has presented today for surgery, with the diagnosis of cardiomyopathy  The various methods of treatment have been discussed with the patient and family. After consideration of risks, benefits and other options for treatment, the patient has consented to  Procedure(s): LEFT HEART CATH AND CORS/GRAFTS ANGIOGRAPHY (N/A) as a surgical intervention .  The patient's history has been reviewed, patient examined, no change in status, stable for surgery.  I have reviewed the patient's chart and labs.  Questions were answered to the patient's satisfaction.     [name]

## 2017-04-29 DIAGNOSIS — I25119 Atherosclerotic heart disease of native coronary artery with unspecified angina pectoris: Secondary | ICD-10-CM

## 2017-04-29 LAB — GLUCOSE, CAPILLARY
Glucose-Capillary: 167 mg/dL — ABNORMAL HIGH (ref 65–99)
Glucose-Capillary: 107 mg/dL — ABNORMAL HIGH (ref 65–99)

## 2017-04-29 LAB — CBC
HCT: 40.8 % (ref 40.0–52.0)
Hemoglobin: 13.3 g/dL (ref 13.0–18.0)
MCH: 24.1 pg — ABNORMAL LOW (ref 26.0–34.0)
MCHC: 32.5 g/dL (ref 32.0–36.0)
MCV: 74.2 fL — ABNORMAL LOW (ref 80.0–100.0)
Platelets: 279 10*3/uL (ref 150–440)
RBC: 5.51 MIL/uL (ref 4.40–5.90)
RDW: 19.4 % — ABNORMAL HIGH (ref 11.5–14.5)
WBC: 8.8 10*3/uL (ref 3.8–10.6)

## 2017-04-29 LAB — BASIC METABOLIC PANEL
Anion gap: 7 (ref 5–15)
BUN: 24 mg/dL — ABNORMAL HIGH (ref 6–20)
CO2: 29 mmol/L (ref 22–32)
Calcium: 9.5 mg/dL (ref 8.9–10.3)
Chloride: 99 mmol/L — ABNORMAL LOW (ref 101–111)
Creatinine, Ser: 0.78 mg/dL (ref 0.61–1.24)
GFR calc Af Amer: >60 mL/min (ref >60)
GFR calc non Af Amer: >60 mL/min (ref >60)
Glucose, Bld: 135 mg/dL — ABNORMAL HIGH (ref 65–99)
Potassium: 5.2 mmol/L — ABNORMAL HIGH (ref 3.5–5.1)
Sodium: 135 mmol/L (ref 135–145)

## 2017-04-29 MED ORDER — NICOTINE 14 MG/24HR TD PT24: 14.0000 mg | MEDICATED_PATCH | Freq: Every day | TRANSDERMAL | 0 refills | Status: DC

## 2017-04-29 MED ORDER — ATORVASTATIN CALCIUM 40 MG PO TABS: 40.0000 mg | ORAL_TABLET | Freq: Every day | ORAL | 0 refills | Status: DC

## 2017-04-29 MED ORDER — OCUVITE-LUTEIN PO CAPS
1.0000 | ORAL_CAPSULE | Freq: Every day | ORAL | Status: DC
  Filled 2017-04-29: qty 1

## 2017-04-29 MED ORDER — LISINOPRIL 5 MG PO TABS: 5.0000 mg | ORAL_TABLET | Freq: Every day | ORAL | 0 refills | Status: DC

## 2017-04-29 MED ORDER — CLOPIDOGREL BISULFATE 75 MG PO TABS: 75.0000 mg | ORAL_TABLET | Freq: Every day | ORAL | 0 refills | Status: DC

## 2017-04-29 MED ORDER — LISINOPRIL 5 MG PO TABS
5.0000 mg | ORAL_TABLET | Freq: Every day | ORAL | Status: DC
  Administered 2017-04-29: 5 mg via ORAL
  Filled 2017-04-29: qty 1

## 2017-04-29 MED ORDER — CARVEDILOL 3.125 MG PO TABS: 3.1250 mg | ORAL_TABLET | Freq: Two times a day (BID) | ORAL | 0 refills | Status: DC

## 2017-04-29 MED ORDER — FUROSEMIDE 40 MG PO TABS
40.0000 mg | ORAL_TABLET | Freq: Two times a day (BID) | ORAL | Status: DC
  Administered 2017-04-29: 40 mg via ORAL
  Filled 2017-04-29: qty 1

## 2017-04-29 MED ORDER — NICOTINE 14 MG/24HR TD PT24
14.0000 mg | MEDICATED_PATCH | Freq: Every day | TRANSDERMAL | Status: DC
  Administered 2017-04-29: 14 mg via TRANSDERMAL
  Filled 2017-04-29: qty 1

## 2017-04-29 MED ORDER — FUROSEMIDE 40 MG PO TABS: 40.0000 mg | ORAL_TABLET | Freq: Two times a day (BID) | ORAL | 11 refills | Status: DC

## 2017-04-29 NOTE — Progress Notes (Signed)
Iv and tele removed from patient. Discharge instructions given to patient along with hard copy prescriptions. Verbalized understanding.  No distress at this time. Wife is at bedside and will transport patient home.

## 2017-04-29 NOTE — Progress Notes (Signed)
Nutrition Follow Up Note  DOCUMENTATION CODES:   Obesity unspecified  INTERVENTION:   Ocuvite vitamin daily for wound healing (provides vitamin C, zinc, copper, selenium, vitamin E, and Vitamin A)  NUTRITION DIAGNOSIS:   Inadequate oral intake related to decreased appetite as evidenced by per patient/family report. -improving   GOAL:   Patient will meet greater than or equal to 90% of their needs  -progressing  MONITOR:   PO intake, Labs, Weight trends, Skin, I & O's  ASSESSMENT:   56 year old male with PMHx of PVD, DM type 2, chronic diastolic CHF, asthma, hernia, PAD, CAD, HLD, HTN, COPD, BPH who presented with increased shortness of breath and bilateral lower extremity edema found to have bilateral pleural effusions, acute respiratory distress secondary to heart failure exacerbation.   Pt doing well; eating 100% of meals. Per chart, pt is weight stable. RD will add Ocuvite vitamin daily to encourage wound healing.   Medications reviewed and include: aspirin, lovenox, lasix, insulin, nicotine   Labs reviewed: K 5.2(H), Cl 99(L), BUN 24(H) cbgs- 205, 185, 135 x 48hrs AIC 8.7(H)- 11/14  Diet Order:  Diet Carb Modified Fluid consistency: Thin; Room service appropriate? Yes Diet - low sodium heart healthy  EDUCATION NEEDS:   Not appropriate for education at this time(Patient refused education offered by RD.)  Skin:  Diabetic Ulcer: right foot  Last BM:  11/19  Height:   Ht Readings from Last 1 Encounters:  04/23/17 5\' 6"  (1.676 m)    Weight:   Wt Readings from Last 1 Encounters:  04/29/17 235 lb 14.4 oz (107 kg)    Ideal Body Weight:  64.5 kg  BMI:  Body mass index is 38.08 kg/m.  Estimated Nutritional Needs:   Kcal:  2030-2215 (MSJ x 1.1-1.2)  Protein:  85-105 grams (0.8-1 grams/kg)  Fluid:  1.6-2 L/day (25-30 mL/kg IBW)  Koleen Distance MS, RD, LDN Pager #- [phone] After Hours Pager: [phone]

## 2017-04-29 NOTE — Progress Notes (Signed)
Progress Note  Patient Name: [name] [name] Date of Encounter: 04/29/2017  Primary Cardiologist: Fletcher Anon  Subjective   No chest pain or SOB. Underwent LHC 11/19 that showed patent LAD and RCA stents with underlying three-vessel CAD with LVEDP of 34 mmHg. Recommended for patient to follow up with CVTS as an outpatient. Started on IV Lasix following LHC with documented UOP of 2.2 L for the past 24 hours and a net - 16.4 L for the admission. Weight down 7 pounds to 235 pounds. BP in the 161W to 96E systolic. Post cath labs stable.   Inpatient Medications    Scheduled Meds: . aspirin EC  81 mg Oral Daily  . atorvastatin  40 mg Oral q1800  . carvedilol  3.125 mg Oral BID WC  . clonazePAM  1 mg Oral QHS  . clopidogrel  75 mg Oral Daily  . enoxaparin (LOVENOX) injection  40 mg Subcutaneous Q24H  . furosemide  40 mg Intravenous BID  . gabapentin  800 mg Oral TID  . insulin aspart  0-15 Units Subcutaneous TID WC  . insulin aspart  0-5 Units Subcutaneous QHS  . insulin aspart  5 Units Subcutaneous TID WC  . insulin glargine  40 Units Subcutaneous QHS  . isosorbide mononitrate  30 mg Oral Daily  . lisinopril  10 mg Oral Daily  . mometasone-formoterol  2 puff Inhalation BID  . potassium chloride  10 mEq Oral Daily  . sodium chloride flush  3 mL Intravenous Q12H  . sodium chloride flush  3 mL Intravenous Q12H   Continuous Infusions: . sodium chloride    . sodium chloride     PRN Meds: sodium chloride, sodium chloride, acetaminophen, albuterol, alum & mag hydroxide-simeth, morphine injection, nitroGLYCERIN, ondansetron (ZOFRAN) IV, oxyCODONE-acetaminophen **AND** oxyCODONE, sodium chloride flush, sodium chloride flush, traZODone   Vital Signs    Vitals:   04/28/17 1118 04/28/17 1737 04/28/17 2012 04/29/17 0400  BP: (!) 87/63 115/79 119/78 94/70  Pulse: 90 98 (!) 101 84  Resp:  20 17 18   Temp: 98.5 F (36.9 C) 98.5 F (36.9 C) 97.7 F (36.5 C) 98.2 F (36.8 C)  TempSrc: Oral  Oral Oral Oral  SpO2: 93% 97% 99% 97%  Weight:    235 lb 14.4 oz (107 kg)  Height:        Intake/Output Summary (Last 24 hours) at 04/29/2017 0751 Last data filed at 04/29/2017 0500 Gross per 24 hour  Intake 480 ml  Output 2700 ml  Net -2220 ml   Filed Weights   04/27/17 0344 04/28/17 0559 04/29/17 0400  Weight: 236 lb 12.8 oz (107.4 kg) 242 lb (109.8 kg) 235 lb 14.4 oz (107 kg)    Telemetry    NSR with occasional PACs - Personally Reviewed  ECG    n/a - Personally Reviewed  Physical Exam   GEN: No acute distress.   Neck: No JVD. Cardiac: RRR, no murmurs, rubs, or gallops. Right radial cath site without bleeding, bruising, swelling, erythema, or TTP. Radial pulse 2+. Respiratory: Clear to auscultation bilaterally.  GI: Soft, nontender, non-distended.   MS: No edema; No deformity. Neuro:  Alert and oriented x 3; Nonfocal.  Psych: Normal affect.  Labs    Chemistry Recent Labs  Lab 04/27/17 0604 04/28/17 0557 04/29/17 0336  NA 132* 128* 135  K 5.0 5.2* 5.2*  CL 95* 93* 99*  CO2 28 27 29   GLUCOSE 205* 185* 135*  BUN 19 24* 24*  CREATININE 0.84 1.05  0.78  CALCIUM 9.0 9.0 9.5  GFRNONAA >60 >60 >60  GFRAA >60 >60 >60  ANIONGAP 9 8 7      Hematology Recent Labs  Lab 04/22/17 1936 04/23/17 0409 04/29/17 0336  WBC 10.2 7.6 8.8  RBC 5.24 5.05 5.51  HGB 12.6* 12.2* 13.3  HCT 39.2* 37.4* 40.8  MCV 74.7* 74.1* 74.2*  MCH 24.0* 24.2* 24.1*  MCHC 32.2 32.6 32.5  RDW 18.6* 18.8* 19.4*  PLT 270 269 279    Cardiac Enzymes Recent Labs  Lab 04/22/17 1936 04/23/17 0409 04/23/17 1002 04/23/17 1553  TROPONINI 0.03* 0.04* 0.03* 0.03*   No results for input(s): TROPIPOC in the last 168 hours.   BNP Recent Labs  Lab 04/22/17 1936  BNP 1,528.0*     DDimer No results for input(s): DDIMER in the last 168 hours.   Radiology    No results found.  Cardiac Studies   Echo 04/2017: - Left ventricle: The cavity size was normal. Systolic function  was severely reduced. The estimated ejection fraction was in the range of 25% to 30%. Diffuse hypokinesis. Regional wall motion abnormalities cannot be excluded. Features are consistent with a pseudonormal left ventricular filling pattern, with concomitant abnormal relaxation and increased filling pressure (grade 2 diastolic dysfunction). - Left atrium: The atrium was moderately dilated. - Right ventricle: The cavity size was mildly dilated. Wall thickness was normal. Systolic function was mildly reduced. - Pulmonary arteries: PA peak pressure: 34 mm Hg (S).  LHC 04/2017: Coronary Findings   Diagnostic  Dominance: Right  Left Main  There is mild diffuse disease throughout the vessel.  Left Anterior Descending  Ost LAD to Prox LAD lesion 30% stenosed  Ost LAD to Prox LAD lesion is 30% stenosed. The lesion was previously treated.  Prox LAD to Mid LAD lesion 60% stenosed  Prox LAD to Mid LAD lesion is 60% stenosed.  Dist LAD lesion 60% stenosed  Dist LAD lesion is 60% stenosed.  First Diagonal Branch  Ost 1st Diag to 1st Diag lesion 80% stenosed  Ost 1st Diag to 1st Diag lesion is 80% stenosed.  Second Diagonal Branch  There is mild disease in the vessel.  Left Circumflex  Prox Cx to Mid Cx lesion 90% stenosed  Prox Cx to Mid Cx lesion is 90% stenosed.  First Obtuse Marginal Branch  Ost 1st Mrg to 1st Mrg lesion 85% stenosed  Ost 1st Mrg to 1st Mrg lesion is 85% stenosed.  Right Coronary Artery  Prox RCA-1 lesion 70% stenosed  Prox RCA-1 lesion is 70% stenosed.  Prox RCA-2 lesion 20% stenosed  Prox RCA-2 lesion is 20% stenosed. The lesion was previously treated.  Mid RCA lesion 80% stenosed  Mid RCA lesion is 80% stenosed.  Mid RCA to Dist RCA lesion 70% stenosed  Mid RCA to Dist RCA lesion is 70% stenosed.  Right Posterior Descending Artery  There is mild disease in the vessel.  Right Posterior Atrioventricular Branch  There is mild disease in the vessel.   First Right Posterolateral  There is mild disease in the vessel.  Second Right Posterolateral  There is mild disease in the vessel.  Intervention   No interventions have been documented.  Coronary Diagrams   Diagnostic Diagram          Patient Profile     56 y.o. male with history of chronic systolic heart failure, coronary artery disease with previous stenting, COPD, tobacco use and peripheral arterial disease who presented with acute on chronic systolic heart  failure and was found to have worsening LV systolic function with an ejection fraction of 25%.  Assessment & Plan    1. Acute on chronic systolic CHF: -Much improved -Transition from IV Lasix to PO Lasix 40 mg bid, hold KCl repletion at this time given potassium of 5.2 -Coreg 3.125 mg bid, BP precludes further titration at this time -Decrease lisinopril to 5 mg daily given soft BP and K+ of 5.2 -Consider spironolactone as an outpatient, will defer starting at this time given potassium of 5.2 -CHF education -Strict Is and Os, daily weights  2. CAD: -LHC 11/19 showed patent LAD and RCA stents -Recommendation of outpatient CVTS evaluation for possible CABG given his comorbid conditions  -DAPT with ASA and Plavix -Imdur  3. Hyperkalemia: -Hold KCl repletion  -Decrease lisinopril given K+ of 5.2 and soft BP  4. HTN: -BP soft this morning -Decrease lisinopril to 5 mg daily -Coreg  5. HLD: -Lipitor   6. Tobacco abuse: -Requests nicotine patch, order placed -IM, please provide the patient with a prescription for nicotine patch at discharge   For questions or updates, please contact [place] Please consult [email or web address] for contact info under Cardiology/STEMI.    Signed, Christell Faith, PA-C [place] Pager: [phone] 04/29/2017, 7:51 AM

## 2017-04-29 NOTE — Discharge Instructions (Signed)
Heart Failure Clinic appointment on April 30 2017 at 1:20pm with Darylene Price, [place]. Please call [phone] to reschedule.     Coronary Artery Disease, Male Coronary artery disease (CAD) is a condition in which the arteries that lead to the heart (coronary arteries) become narrow or blocked. The narrowing or blockage can lead to decreased blood flow to the heart. Prolonged reduced blood flow can cause a heart attack (myocardial infarction or MI). This condition may also be called coronary heart disease. Because CAD is the leading cause of death in men, it is important to understand what causes this condition and how it is treated. What are the causes? CAD is most often caused by atherosclerosis. This is the buildup of fat and cholesterol (plaque) on the inside of the arteries. Over time, the plaque may narrow or block the artery, reducing blood flow to the heart. Plaque can also become weak and break off within a coronary artery and cause a sudden blockage. Other less common causes of CAD include:  An embolism or blood clot in a coronary artery.  A tearing of the artery (spontaneous coronary artery dissection).  An aneurysm.  Inflammation (vasculitis) in the artery wall.  What increases the risk? The following factors may make you more likely to develop this condition:  Age. Men over age 61 are at a greater risk of CAD.  Family history of CAD.  Gender. Men often develop CAD earlier in life than women.  High blood pressure (hypertension).  Diabetes.  High cholesterol levels.  Tobacco use.  Excessive alcohol use.  Lack of exercise.  A diet high in saturated and trans fats, such as fried food and processed meat.  Other possible risk factors include:  High stress levels.  Depression.  Obesity.  Sleep apnea.  What are the signs or symptoms? Many people do not have any symptoms during the early stages of CAD. As the condition progresses, symptoms may  include:  Chest pain (angina). The pain can: ? Feel like a crushing or squeezing, or a tightness, pressure, fullness, or heaviness in the chest. ? Last more than a few minutes or can stop and recur. The pain tends to get worse with exercise or stress and to fade with rest.  Pain in the arms, neck, jaw, or back.  Unexplained heartburn or indigestion.  Shortness of breath.  Nausea or vomiting.  Sudden light-headedness.  Sudden cold sweats.  Fluttering or fast heartbeat (palpitations).  How is this diagnosed? This condition is diagnosed based on:  Your family and medical history.  A physical exam.  Tests, including: ? A test to check the electrical signals in your heart (electrocardiogram). ? Exercise stress test. This looks for signs of blockage when the heart is stressed with exercise, such as running on a treadmill. ? Pharmacologic stress test. This test looks for signs of blockage when the heart is being stressed with a medicine. ? Blood tests. ? Coronary angiogram. This is a procedure to look at the coronary arteries to see if there is any blockage. During this test, a dye is injected into your arteries so they appear on an X-ray. ? A test that uses sound waves to take a picture of your heart (echocardiogram). ? Chest X-ray.  How is this treated? This condition may be treated by:  Healthy lifestyle changes to reduce risk factors.  Medicines such as: ? Antiplatelet medicines and blood-thinning medicines, such as aspirin. These help to prevent blood clots. ? Nitroglycerin. ? Blood pressure medicines. ?  Cholesterol-lowering medicine.  Coronary angioplasty and stenting. During this procedure, a thin, flexible tube is inserted through a blood vessel and into a blocked artery. A balloon or similar device on the end of the tube is inflated to open up the artery. In some cases, a small, mesh tube (stent) is inserted into the artery to keep it open.  Coronary artery bypass  surgery. During this surgery, veins or arteries from other parts of the body are used to create a bypass around the blockage and allow blood to reach your heart.  Follow these instructions at home: Medicines  Take over-the-counter and prescription medicines only as told by your health care provider.  Do not take the following medicines unless your health care provider approves: ? NSAIDs, such as ibuprofen, naproxen, or celecoxib. ? Vitamin supplements that contain vitamin A, vitamin E, or both. Lifestyle  Follow an exercise program approved by your health care provider. Aim for 150 minutes of moderate exercise or 75 minutes of vigorous exercise each week.  Maintain a healthy weight or lose weight as approved by your health care provider.  Rest when you are tired.  Learn to manage stress or try to limit your stress. Ask your health care provider for suggestions if you need help.  Get screened for depression and seek treatment, if needed.  Do not use any products that contain nicotine or tobacco, such as cigarettes and e-cigarettes. If you need help quitting, ask your health care provider.  Do not use illegal drugs. Eating and drinking  Follow a heart-healthy diet. A dietitian can help educate you about healthy food options and changes. In general, eat plenty of fruits and vegetables, lean meats, and whole grains.  Avoid foods high in: ? Sugar. ? Salt (sodium). ? Saturated fat, such as processed or fatty meat. ? Trans fat, such as fried foods.  Use healthy cooking methods such as roasting, grilling, broiling, baking, poaching, steaming, or stir-frying.  If you drink alcohol, and your health care provider approves, limit your alcohol intake to no more than 2 drinks per day. One drink equals 12 ounces of beer, 5 ounces of wine, or 1 ounces of hard liquor. General instructions  Manage any other health conditions, such as hypertension and diabetes. These conditions affect your  heart.  Your health care provider may ask you to monitor your blood pressure. Ideally, your blood pressure should be below 130/80.  Keep all follow-up visits as told by your health care provider. This is important. Get help right away if:  You have pain in your chest, neck, arm, jaw, stomach, or back that: ? Lasts more than a few minutes. ? Is recurring. ? Is not relieved by taking medicine under your tongue (sublingualnitroglycerin).  You have too much (profuse) sweating without cause.  You have unexplained: ? Heartburn or indigestion. ? Shortness of breath or difficulty breathing. ? Fluttering or fast heartbeat (palpitations). ? Nausea or vomiting. ? Fatigue. ? Feelings of nervousness or anxiety. ? Weakness. ? Diarrhea.  You have sudden light-headedness or dizziness.  You faint.  You feel like hurting yourself or think about taking your own life. These symptoms may represent a serious problem that is an emergency. Do not wait to see if the symptoms will go away. Get medical help right away. Call your local emergency services (911 in the [place] Do not drive yourself to the hospital. Summary  Coronary artery disease (CAD) is a process in which the arteries that lead to the heart (coronary arteries)  become narrow or blocked. The narrowing or blockage can lead to a heart attack.  Many people do not have any symptoms during the early stages of CAD. This is called "silent CAD."  CAD can be treated with lifestyle changes, medicines, surgery, or a combination of these treatments. This information is not intended to replace advice given to you by your health care provider. Make sure you discuss any questions you have with your health care provider. Document Released: 12/22/2013 Document Revised: 05/17/2016 Document Reviewed: 05/17/2016 Elsevier Interactive Patient Education  2017 [employer].   Heart Failure Heart failure means your heart has trouble pumping blood. This makes it  hard for your body to work well. Heart failure is usually a long-term (chronic) condition. You must take good care of yourself and follow your doctor's treatment plan. Follow these instructions at home:  Take your heart medicine as told by your doctor. ? Do not stop taking medicine unless your doctor tells you to. ? Do not skip any dose of medicine. ? Refill your medicines before they run out. ? Take other medicines only as told by your doctor or pharmacist.  Stay active if told by your doctor. The elderly and people with severe heart failure should talk with a doctor about physical activity.  Eat heart-healthy foods. Choose foods that are without trans fat and are low in saturated fat, cholesterol, and salt (sodium). This includes fresh or frozen fruits and vegetables, fish, lean meats, fat-free or low-fat dairy foods, whole grains, and high-fiber foods. Lentils and dried peas and beans (legumes) are also good choices.  Limit salt if told by your doctor.  Cook in a healthy way. Roast, grill, broil, bake, poach, steam, or stir-fry foods.  Limit fluids as told by your doctor.  Weigh yourself every morning. Do this after you pee (urinate) and before you eat breakfast. Write down your weight to give to your doctor.  Take your blood pressure and write it down if your doctor tells you to.  Ask your doctor how to check your pulse. Check your pulse as told.  Lose weight if told by your doctor.  Stop smoking or chewing tobacco. Do not use gum or patches that help you quit without your doctor's approval.  Schedule and go to doctor visits as told.  Nonpregnant women should have no more than 1 drink a day. Men should have no more than 2 drinks a day. Talk to your doctor about drinking alcohol.  Stop illegal drug use.  Stay current with shots (immunizations).  Manage your health conditions as told by your doctor.  Learn to manage your stress.  Rest when you are tired.  If it is really  hot outside: ? Avoid intense activities. ? Use air conditioning or fans, or get in a cooler place. ? Avoid caffeine and alcohol. ? Wear loose-fitting, lightweight, and light-colored clothing.  If it is really cold outside: ? Avoid intense activities. ? Layer your clothing. ? Wear mittens or gloves, a hat, and a scarf when going outside. ? Avoid alcohol.  Learn about heart failure and get support as needed.  Get help to maintain or improve your quality of life and your ability to care for yourself as needed. Contact a doctor if:  You gain weight quickly.  You are more short of breath than usual.  You cannot do your normal activities.  You tire easily.  You cough more than normal, especially with activity.  You have any or more puffiness (swelling) in  areas such as your hands, feet, ankles, or belly (abdomen).  You cannot sleep because it is hard to breathe.  You feel like your heart is beating fast (palpitations).  You get dizzy or light-headed when you stand up. Get help right away if:  You have trouble breathing.  There is a change in mental status, such as becoming less alert or not being able to focus.  You have chest pain or discomfort.  You faint. This information is not intended to replace advice given to you by your health care provider. Make sure you discuss any questions you have with your health care provider. Document Released: 03/05/2008 Document Revised: 11/02/2015 Document Reviewed: 07/13/2012 Elsevier Interactive Patient Education  2017 [employer].

## 2017-04-29 NOTE — Care Management (Signed)
Patient discharge meds were completed at [facility].  Patient will be assessed as outpatient for CABG

## 2017-04-29 NOTE — Discharge Summary (Addendum)
[facility] [place] NAME: [name] [name]    MR#:  [id number]  DATE OF BIRTH:  12/02/1960  DATE OF ADMISSION:  04/22/2017   ADMITTING PHYSICIAN: Saundra Shelling, MD  DATE OF DISCHARGE: 04/29/2017 10:48 AM  PRIMARY CARE PHYSICIAN: Clarisse Gouge, MD   ADMISSION DIAGNOSIS:  Acute on chronic congestive heart failure, unspecified heart failure type ([place]) [I50.9] DISCHARGE DIAGNOSIS:  Active Problems:   CHF (congestive heart failure) ([place])  SECONDARY DIAGNOSIS:   Past Medical History:  Diagnosis Date  . Arthritis   . Asthma   . Atherosclerosis   . BPH (benign prostatic hyperplasia)   . Chronic diastolic CHF (congestive heart failure) ([place])    a. 02/2013 EF 50% by LV gram.  . COPD (chronic obstructive pulmonary disease) ([place])   . Coronary artery disease    a. Status post LAD PCI in 2013 at [facility];  b. October 2014 - Nuclear stress test showed inferior wall defect->Cath: patent LAD stent with 90% stenosis in the proximal RCA ( 2.5 x 23 mm DES), EF 50%;  c. 02/2014 MV w/ large inferior scar->Cath: LAD 26m, LCX 54m, 20d, RCA 20p/m, patent stent->Med Rx.  . Diabetes mellitus without complication ([place])   . Hernia   . Hyperlipidemia   . Hypertension   . Morbid obesity ([place])   . Neuropathy   . PAD (peripheral artery disease) ([place])    a. Angiography in March of 2012 showed 70-80% proximal right SFA stenosis status post self-expanding stent placement (6X60 mm). Angiography in April 2012 showed proximal left SFA occlusion status post self-expanding stent placement (7 X unkonw length). Both done by Dr. Lucky Cowboy.  . Peripheral vascular disease ([place])   . Sleep apnea   . Syncope and collapse   . Tobacco use    a. 75+ yr hx - still smoking 1ppd, down from 2 ppd.  . Varicose vein    HOSPITAL COURSE:  56 year old male with past medical history of diabetes, hypertension, hyperlipidemia, morbid obesity, peripheral vascular disease, chronic diastolic CHF, COPD,  BPH admitted due to shortness of breath and noted to be in acute respiratory failure with hypoxia secondary to CHF.  1. Acute respiratory failure with hypoxia-secondary to CHF.  - Resolved now. O2 saturation 97% on room air  2. Acute on chronic systolic CHF- Due to underlying CAD, echocardiogram this time showed EF 25-30% with diffuse hypokinesia, patient had previously normal echo in 2016.  - s/p cath -> showed patent LAD and RCA stents with underlying diffuse three-vessel coronary artery disease.   *Coronary artery disease:  - continueaspirin, Plavix, lisinopril, Lasix, Coreg. - Patient is stable for discharge and outpatient follow-up with Dr. Servando Snare (cardiac surgery) to discuss CABG. Continue current dose of carvedilol and decrease lisinopril due to borderline hyperkalemia and soft BP. continue DAPT for now, though clopidogrel would need to be stopped at least 5-7 days prior to CABG, if decision is made to proceed with surgical revascularization. Discharged on furosemide 40 mg PO BID and has been instructed to weigh himself daily and limit his salt intake. He will f/u with Dr. Fletcher Anon in the office.  3. COPD-no acute exacerbation. Continue Dulera, DuoNeb nebs as needed.  Acute bronchitis: Continue albuterol nebulizer, Dulera. No pneumonia. Improvement with antibiotics  4. Diabetes type 2 without complication- continuehome regimen 5. Hyperlipidemia-continue atorvastatin.  6. Anxiety-continue Klonopin.  7. Neuropathy-continue gabapentin. DISCHARGE CONDITIONS:  stable CONSULTS OBTAINED:  Treatment Team:  Minna Merritts, MD Yolonda Kida,  MD DRUG ALLERGIES:   Allergies  Allergen Reactions  . Dulaglutide Anaphylaxis, Diarrhea and Hives   DISCHARGE MEDICATIONS:   Allergies as of 04/29/2017      Reactions   Dulaglutide Anaphylaxis, Diarrhea, Hives      Medication List    STOP taking these medications   amoxicillin-clavulanate 875-125 MG  tablet Commonly known as:  AUGMENTIN   insulin glargine 100 unit/mL Sopn Commonly known as:  LANTUS Replaced by:  insulin glargine 100 UNIT/ML injection     TAKE these medications   albuterol 108 (90 Base) MCG/ACT inhaler Commonly known as:  PROVENTIL HFA;VENTOLIN HFA Inhale 2 puffs into the lungs every 6 (six) hours as needed for wheezing or shortness of breath.   aspirin EC 81 MG tablet Take 1 tablet (81 mg total) by mouth daily.   atorvastatin 40 MG tablet Commonly known as:  LIPITOR Take 1 tablet (40 mg total) daily at 6 PM by mouth.   BREO ELLIPTA 100-25 MCG/INH Aepb Generic drug:  fluticasone furoate-vilanterol Inhale 1 puff daily into the lungs.   carvedilol 3.125 MG tablet Commonly known as:  COREG Take 1 tablet (3.125 mg total) 2 (two) times daily with a meal by mouth.   clonazePAM 1 MG tablet Commonly known as:  KLONOPIN Take 1 mg by mouth at bedtime.   clopidogrel 75 MG tablet Commonly known as:  PLAVIX Take 1 tablet (75 mg total) daily by mouth.   collagenase ointment Commonly known as:  SANTYL Apply topically daily.   furosemide 40 MG tablet Commonly known as:  LASIX Take 1 tablet (40 mg total) 2 (two) times daily by mouth. What changed:    medication strength  how much to take  when to take this   gabapentin 800 MG tablet Commonly known as:  NEURONTIN Take 800 mg by mouth 3 (three) times daily.   glipiZIDE 10 MG tablet Commonly known as:  GLUCOTROL Take 10 mg by mouth daily before breakfast.   insulin glargine 100 UNIT/ML injection Commonly known as:  LANTUS Inject 0.4 mLs (40 Units total) at bedtime into the skin. Replaces:  insulin glargine 100 unit/mL Sopn   insulin lispro 100 UNIT/ML injection Commonly known as:  HUMALOG Inject 0-10 Units into the skin 3 (three) times daily with meals. If BS is 0 to 70 no insulin and eat/glucose tablets/glucose get If BS 71 to 150 great! If BS 151 to 200, 2 Units Humaog If BS 201 to 250, 4 Units  Humalog If BS 251 to 300, 6 Units Humalog If BS 301 to 350, 8 Units Humalog If BS 351 to 400, 10 Units Humalog If BS greater than 400, take 10 Units and Contact our office during office hour   isosorbide mononitrate 30 MG 24 hr tablet Commonly known as:  IMDUR Take 1 tablet (30 mg total) by mouth daily.   lisinopril 5 MG tablet Commonly known as:  PRINIVIL,ZESTRIL Take 1 tablet (5 mg total) daily by mouth.   metFORMIN 1000 MG tablet Commonly known as:  GLUCOPHAGE Take 1,000 mg by mouth 2 (two) times daily with a meal.   nicotine 14 mg/24hr patch Commonly known as:  NICODERM CQ - dosed in mg/24 hours Place 1 patch (14 mg total) daily onto the skin.   nitroGLYCERIN 0.4 MG SL tablet Commonly known as:  NITROSTAT Place 0.4 mg under the tongue every 5 (five) minutes as needed for chest pain.   oxyCODONE-acetaminophen 10-325 MG tablet Commonly known as:  PERCOCET Take by mouth.  potassium chloride 10 MEQ tablet Commonly known as:  K-DUR Take 1 tablet (10 mEq total) by mouth daily.   SYMBICORT 80-4.5 MCG/ACT inhaler Generic drug:  budesonide-formoterol Inhale 2 puffs as needed into the lungs.        DISCHARGE INSTRUCTIONS:   DIET:  Regular diet DISCHARGE CONDITION:  Good ACTIVITY:  Activity as tolerated OXYGEN:  Home Oxygen: No.  Oxygen Delivery: room air DISCHARGE LOCATION:  home   If you experience worsening of your admission symptoms, develop shortness of breath, life threatening emergency, suicidal or homicidal thoughts you must seek medical attention immediately by calling 911 or calling your MD immediately  if symptoms less severe.  You Must read complete instructions/literature along with all the possible adverse reactions/side effects for all the Medicines you take and that have been prescribed to you. Take any new Medicines after you have completely understood and accpet all the possible adverse reactions/side effects.   Please note  You were cared for  by a hospitalist during your hospital stay. If you have any questions about your discharge medications or the care you received while you were in the hospital after you are discharged, you can call the unit and asked to speak with the hospitalist on call if the hospitalist that took care of you is not available. Once you are discharged, your primary care physician will handle any further medical issues. Please note that NO REFILLS for any discharge medications will be authorized once you are discharged, as it is imperative that you return to your primary care physician (or establish a relationship with a primary care physician if you do not have one) for your aftercare needs so that they can reassess your need for medications and monitor your lab values.    On the day of Discharge:  VITAL SIGNS:  Blood pressure (!) 118/94, pulse (!) 101, temperature 98.2 F (36.8 C), temperature source Oral, resp. rate 18, height 5\' 6"  (1.676 m), weight 107 kg (235 lb 14.4 oz), SpO2 95 %. PHYSICAL EXAMINATION:  GENERAL:  56 y.o.-year-old patient lying in the bed with no acute distress.  EYES: Pupils equal, round, reactive to light and accommodation. No scleral icterus. Extraocular muscles intact.  HEENT: Head atraumatic, normocephalic. Oropharynx and nasopharynx clear.  NECK:  Supple, no jugular venous distention. No thyroid enlargement, no tenderness.  LUNGS: Normal breath sounds bilaterally, no wheezing, rales,rhonchi or crepitation. No use of accessory muscles of respiration.  CARDIOVASCULAR: S1, S2 normal. No murmurs, rubs, or gallops.  ABDOMEN: Soft, non-tender, non-distended. Bowel sounds present. No organomegaly or mass.  EXTREMITIES: No pedal edema, cyanosis, or clubbing.  NEUROLOGIC: Cranial nerves II through XII are intact. Muscle strength 5/5 in all extremities. Sensation intact. Gait not checked.  PSYCHIATRIC: The patient is alert and oriented x 3.  SKIN: No obvious rash, lesion, or ulcer.  DATA  REVIEW:   CBC Recent Labs  Lab 04/29/17 0336  WBC 8.8  HGB 13.3  HCT 40.8  PLT 279    Chemistries  Recent Labs  Lab 04/29/17 0336  NA 135  K 5.2*  CL 99*  CO2 29  GLUCOSE 135*  BUN 24*  CREATININE 0.78  CALCIUM 9.5     Follow-up Information    [facility] Follow up on 04/30/2017.   Specialty:  Cardiology Why:  at 1:20pm Contact information: [address] [place] [phone]       Clarisse Gouge, MD. Go on 05/06/2017.  Specialty:  Family Medicine Why:  at 3:20pm Contact information: [place] [address] [phone]        Grace Isaac, MD. Schedule an appointment as soon as possible for a visit in 2 week(s).   Specialty:  Cardiothoracic Surgery Why:  office will follow up with patient  Contact information: [address] [place] [address] [phone]        Wellington Hampshire, MD. Go on 05/09/2017.   Specialty:  Cardiology Why:  at 3:40pm Contact information: [place] [address] [phone]            Management plans discussed with the patient, family and they are in agreement.  CODE STATUS: Prior   TOTAL TIME TAKING CARE OF THIS PATIENT: 45 minutes.    Max Sane M.D on 04/29/2017 at 7:59 PM  Between 7am to 6pm - Pager - [phone]  After 6pm go to [email or web address] - [employer] [facility]  Office  [phone]  CC: Primary care physician; Clarisse Gouge, MD   Note: This dictation was prepared with Dragon dictation along with smaller phrase technology. Any transcriptional errors that result from this process are unintentional.

## 2017-04-30 ENCOUNTER — Encounter: Payer: Self-pay | Admitting: Family

## 2017-04-30 ENCOUNTER — Other Ambulatory Visit: Payer: Self-pay

## 2017-04-30 ENCOUNTER — Ambulatory Visit: Payer: Medicaid Other | Attending: Family | Admitting: Family

## 2017-04-30 VITALS — BP 91/52 | HR 96 | Resp 18 | Ht 67.0 in | Wt 246.0 lb

## 2017-04-30 DIAGNOSIS — E1140 Type 2 diabetes mellitus with diabetic neuropathy, unspecified: Secondary | ICD-10-CM | POA: Diagnosis not present

## 2017-04-30 DIAGNOSIS — J449 Chronic obstructive pulmonary disease, unspecified: Secondary | ICD-10-CM | POA: Diagnosis not present

## 2017-04-30 DIAGNOSIS — E1151 Type 2 diabetes mellitus with diabetic peripheral angiopathy without gangrene: Secondary | ICD-10-CM | POA: Insufficient documentation

## 2017-04-30 DIAGNOSIS — E11621 Type 2 diabetes mellitus with foot ulcer: Secondary | ICD-10-CM

## 2017-04-30 DIAGNOSIS — Z794 Long term (current) use of insulin: Secondary | ICD-10-CM | POA: Diagnosis not present

## 2017-04-30 DIAGNOSIS — I10 Essential (primary) hypertension: Secondary | ICD-10-CM

## 2017-04-30 DIAGNOSIS — I110 Hypertensive heart disease with heart failure: Secondary | ICD-10-CM | POA: Insufficient documentation

## 2017-04-30 DIAGNOSIS — Z720 Tobacco use: Secondary | ICD-10-CM

## 2017-04-30 DIAGNOSIS — L97509 Non-pressure chronic ulcer of other part of unspecified foot with unspecified severity: Secondary | ICD-10-CM

## 2017-04-30 DIAGNOSIS — Z79899 Other long term (current) drug therapy: Secondary | ICD-10-CM | POA: Insufficient documentation

## 2017-04-30 DIAGNOSIS — Z6838 Body mass index (BMI) 38.0-38.9, adult: Secondary | ICD-10-CM | POA: Insufficient documentation

## 2017-04-30 DIAGNOSIS — I2510 Atherosclerotic heart disease of native coronary artery without angina pectoris: Secondary | ICD-10-CM | POA: Diagnosis not present

## 2017-04-30 DIAGNOSIS — N400 Enlarged prostate without lower urinary tract symptoms: Secondary | ICD-10-CM | POA: Insufficient documentation

## 2017-04-30 DIAGNOSIS — I5022 Chronic systolic (congestive) heart failure: Secondary | ICD-10-CM

## 2017-04-30 DIAGNOSIS — I890 Lymphedema, not elsewhere classified: Secondary | ICD-10-CM | POA: Diagnosis not present

## 2017-04-30 DIAGNOSIS — Z7982 Long term (current) use of aspirin: Secondary | ICD-10-CM | POA: Insufficient documentation

## 2017-04-30 DIAGNOSIS — F17210 Nicotine dependence, cigarettes, uncomplicated: Secondary | ICD-10-CM | POA: Insufficient documentation

## 2017-04-30 DIAGNOSIS — I5042 Chronic combined systolic (congestive) and diastolic (congestive) heart failure: Secondary | ICD-10-CM | POA: Insufficient documentation

## 2017-04-30 NOTE — Patient Instructions (Addendum)
Continue weighing daily and call for an overnight weight gain of > 2 pounds or a weekly weight gain of >5 pounds.  Decrease fluid intake to 60 ounces daily.   Stop taking the lisinopril due to low blood pressure.

## 2017-04-30 NOTE — Progress Notes (Signed)
Patient ID: IKEEM CLECKLER, male    DOB: April 15, 1961, 56 y.o.   MRN: [id number]  HPI  Mr Cessna is a 56 y/o male with a history of obstructive sleep apnea, PVD, PAD, HTN, hyperlipidemia, DM, CAD, COPD, BPH, asthma, current tobacco use and chronic heart failure.   Echo report from 04/25/17 reviewed and shows an EF of 25-30% along with a PA pressure of 34 mm Hg. EF has declined from 55-60% back in 2016. Cardiac catheterization done 04/28/17 showed significant three-vessel disease with a patient stent in the RCA and LAD. Significant proximal RCS disease and the stent as well as diffuse mid and distal disease. LAD has moderate disease. Severely elevated left ventricular end-diastolic pressure at 34 mmHg. Possible CABG in the future with optimizing medical management. Stress done in 2015.  Admitted 04/22/17 due to HF exacerbation. Cardiology consult obtained. Cardiac catheterization done showing diffuse three-vessel disease. To have an outpatient consult regarding possible CABG in the future. Medications were adjusted and he was discharged home after 7 days. Was in the ED 02/14/17 due to elbow/shoulder pain after a mechanical fall. Patient hit his head but no LOC. Xrays done which ruled out fractures. Released home.  He presents today for his initial visit with a chief complaint of moderate fatigue with little exertion. He describes this as chronic in nature having been present for several years with varying levels of severity. He has associated shortness of breath, wheezing, dizziness, edema and difficulty sleeping. He denies any chest pain.  Past Medical History:  Diagnosis Date  . Arthritis   . Asthma   . Atherosclerosis   . BPH (benign prostatic hyperplasia)   . Chronic diastolic CHF (congestive heart failure) ([place])    a. 02/2013 EF 50% by LV gram.  . COPD (chronic obstructive pulmonary disease) ([place])   . Coronary artery disease    a. Status post LAD PCI in 2013 at [facility];  b. October 2014 - Nuclear  stress test showed inferior wall defect->Cath: patent LAD stent with 90% stenosis in the proximal RCA ( 2.5 x 23 mm DES), EF 50%;  c. 02/2014 MV w/ large inferior scar->Cath: LAD 67m, LCX 25m, 20d, RCA 20p/m, patent stent->Med Rx.  . Diabetes mellitus without complication ([place])   . Hernia   . Hyperlipidemia   . Hypertension   . Morbid obesity ([place])   . Neuropathy   . PAD (peripheral artery disease) ([place])    a. Angiography in March of 2012 showed 70-80% proximal right SFA stenosis status post self-expanding stent placement (6X60 mm). Angiography in April 2012 showed proximal left SFA occlusion status post self-expanding stent placement (7 X unkonw length). Both done by Dr. Lucky Cowboy.  . Peripheral vascular disease ([place])   . Sleep apnea   . Syncope and collapse   . Tobacco use    a. 75+ yr hx - still smoking 1ppd, down from 2 ppd.  . Varicose vein    Past Surgical History:  Procedure Laterality Date  . ABDOMINAL AORTAGRAM N/A 06/23/2013   Procedure: ABDOMINAL Maxcine Ham;  Surgeon: Wellington Hampshire, MD;  Location: [facility] CATH LAB;  Service: Cardiovascular;  Laterality: N/A;  . APPENDECTOMY    . CARDIAC CATHETERIZATION  10/14   [facility]; X1 STENT PROXIMAL RCA  . CARDIAC CATHETERIZATION  02/2010   [facility]  . CARDIAC CATHETERIZATION  02/21/2014   [facility]  . CLAVICLE SURGERY    . HERNIA REPAIR    . LEFT HEART CATH AND CORS/GRAFTS ANGIOGRAPHY N/A 04/28/2017  Procedure: LEFT HEART CATH AND CORONARY ANGIOGRAPHY;  Surgeon: Wellington Hampshire, MD;  Location: [facility] CV LAB;  Service: Cardiovascular;  Laterality: N/A;  . PERIPHERAL ARTERIAL STENT GRAFT     x2 left/right  . PERIPHERAL VASCULAR CATHETERIZATION N/A 07/06/2015   Procedure: Abdominal Aortogram w/Lower Extremity;  Surgeon: Algernon Huxley, MD;  Location: [facility] CV LAB;  Service: Cardiovascular;  Laterality: N/A;  . PERIPHERAL VASCULAR CATHETERIZATION  07/06/2015   Procedure: Lower Extremity Intervention;  Surgeon: Algernon Huxley, MD;  Location: [facility] CV LAB;  Service: Cardiovascular;;   Family History  Problem Relation Age of Onset  . Heart attack Father   . Heart disease Father   . Hypertension Father   . Hyperlipidemia Father   . Hypertension Mother   . Hyperlipidemia Mother    Social History   Tobacco Use  . Smoking status: Current Every Day Smoker    Packs/day: 0.30    Years: 41.00    Pack years: 12.30    Types: Cigarettes  . Smokeless tobacco: Never Used  Substance Use Topics  . Alcohol use: No   Allergies  Allergen Reactions  . Dulaglutide Anaphylaxis, Diarrhea and Hives   Prior to Admission medications   Medication Sig Start Date End Date Taking? Authorizing Provider  albuterol (PROVENTIL HFA;VENTOLIN HFA) 108 (90 BASE) MCG/ACT inhaler Inhale 2 puffs into the lungs every 6 (six) hours as needed for wheezing or shortness of breath. 04/15/13  Yes Wellington Hampshire, MD  aspirin EC 81 MG tablet Take 1 tablet (81 mg total) by mouth daily. 05/11/15  Yes Wellington Hampshire, MD  atorvastatin (LIPITOR) 40 MG tablet Take 1 tablet (40 mg total) daily at 6 PM by mouth. 04/29/17 05/29/17 Yes Max Sane, MD  budesonide-formoterol (SYMBICORT) 80-4.5 MCG/ACT inhaler Inhale 2 puffs as needed into the lungs.    Yes [provider]  carvedilol (COREG) 3.125 MG tablet Take 1 tablet (3.125 mg total) 2 (two) times daily with a meal by mouth. 04/29/17  Yes Max Sane, MD  clonazePAM (KLONOPIN) 1 MG tablet Take 1 mg by mouth at bedtime.   Yes [provider]  clopidogrel (PLAVIX) 75 MG tablet Take 1 tablet (75 mg total) daily by mouth. 04/29/17  Yes Max Sane, MD  collagenase (SANTYL) ointment Apply topically daily. 05/08/16  Yes Mody, Sital, MD  fluticasone furoate-vilanterol (BREO ELLIPTA) 100-25 MCG/INH AEPB Inhale 1 puff daily into the lungs. 08/15/16  Yes [provider]  furosemide (LASIX) 40 MG tablet Take 1 tablet (40 mg total) 2 (two) times daily by mouth. 04/29/17 04/29/18 Yes Max Sane, MD   gabapentin (NEURONTIN) 800 MG tablet Take 1,600 mg by mouth 3 (three) times daily.    Yes [provider]  glipiZIDE (GLUCOTROL) 10 MG tablet Take 10 mg by mouth daily before breakfast.   Yes [provider]  insulin glargine (LANTUS) 100 UNIT/ML injection Inject 0.4 mLs (40 Units total) at bedtime into the skin. 04/25/17  Yes Epifanio Lesches, MD  insulin lispro (HUMALOG) 100 UNIT/ML injection Inject 0-10 Units into the skin 3 (three) times daily with meals. If BS is 0 to 70 no insulin and eat/glucose tablets/glucose get If BS 71 to 150 great! If BS 151 to 200, 2 Units Humaog If BS 201 to 250, 4 Units Humalog If BS 251 to 300, 6 Units Humalog If BS 301 to 350, 8 Units Humalog If BS 351 to 400, 10 Units Humalog If BS greater than 400, take 10  Units and Contact our office during office hour   Yes [provider]  isosorbide mononitrate (IMDUR) 30 MG 24 hr tablet Take 1 tablet (30 mg total) by mouth daily. 07/11/16  Yes Demetrios Loll, MD  lisinopril (PRINIVIL,ZESTRIL) 5 MG tablet Take 1 tablet (5 mg total) daily by mouth. 04/29/17  Yes Max Sane, MD  metFORMIN (GLUCOPHAGE) 1000 MG tablet Take 1,000 mg by mouth 2 (two) times daily with a meal.   Yes [provider]  nitroGLYCERIN (NITRODUR - DOSED IN MG/24 HR) 0.4 mg/hr patch Place 0.4 mg onto the skin daily.   Yes [provider]  nitroGLYCERIN (NITROSTAT) 0.4 MG SL tablet Place 0.4 mg under the tongue every 5 (five) minutes as needed for chest pain.   Yes [provider]  oxyCODONE-acetaminophen (PERCOCET) 10-325 MG tablet Take by mouth. 01/21/17 05/24/17 Yes [provider]  potassium chloride (K-DUR) 10 MEQ tablet Take 1 tablet (10 mEq total) by mouth daily. 07/18/16 04/30/17 Yes Wende Bushy, MD  nicotine (NICODERM CQ - DOSED IN MG/24 HOURS) 14 mg/24hr patch Place 1 patch (14 mg total) daily onto the skin. Patient not taking: Reported on 04/30/2017 04/29/17   Max Sane, MD     Review of Systems  Constitutional: Positive for fatigue. Negative for appetite change.  HENT: Negative for congestion, postnasal drip and sore throat.   Eyes: Positive for visual disturbance (blurry vision).  Respiratory: Positive for shortness of breath and wheezing. Negative for cough and chest tightness.   Cardiovascular: Positive for leg swelling. Negative for chest pain and palpitations.  Gastrointestinal: Negative for abdominal distention and abdominal pain.  Endocrine: Negative.   Genitourinary: Negative.   Musculoskeletal: Positive for back pain. Negative for neck pain.  Skin: Positive for wound (nonhealing ulcer right foot).  Allergic/Immunologic: Negative.   Neurological: Positive for dizziness and light-headedness.  Hematological: Negative for adenopathy. Bruises/bleeds easily.  Psychiatric/Behavioral: Positive for sleep disturbance (sleeping on 2 pillows). Negative for dysphoric mood. The patient is not nervous/anxious.    Vitals:   04/30/17 1513  BP: (!) 91/52  Pulse: 96  Resp: 18  SpO2: 97%  Weight: 246 lb (111.6 kg)  Height: 5\' 7"  (1.702 m)   Wt Readings from Last 3 Encounters:  04/30/17 246 lb (111.6 kg)  04/29/17 235 lb 14.4 oz (107 kg)  07/18/16 247 lb 8 oz (112.3 kg)   Lab Results  Component Value Date   CREATININE 0.78 04/29/2017   CREATININE 1.05 04/28/2017   CREATININE 0.84 04/27/2017   Physical Exam  Constitutional: He is oriented to person, place, and time. He appears well-developed and well-nourished.  HENT:  Head: Normocephalic and atraumatic.  Neck: Normal range of motion. Neck supple. No JVD present.  Cardiovascular: Normal rate. An irregular rhythm present.  Pulmonary/Chest: Effort normal. He has wheezes in the right lower field and the left lower field. He has no rales.  Abdominal: Soft. He exhibits no distension. There is no tenderness.  Musculoskeletal: He exhibits edema (1+ pitting edema in right lower leg). He exhibits no tenderness.   Neurological: He is alert and oriented to person, place, and time.  Skin: Skin is warm and dry.  Psychiatric: He has a normal mood and affect. His behavior is normal. Thought content normal.  Nursing note and vitals reviewed.   Assessment & Plan:  1: Chronic heart failure with reduced ejection fraction- - NYHA class III - mildly fluid overloaded today - weighing daily and he was instructed to call for an overnight weight gain  of >2 pounds or a weekly weight gain of >5 pounds - not adding salt and has been using Mrs. Dash. However, he admits to eating foods high in sodium. He ate at [employer] last night and had eggs with cheese, hashbrowns, bacon & sausage. Discussed the importance of closely following a 2000mg  sodium diet and written information was given to him about this. Reviewed how to read food labels as well.  - currently drinking 60 ounces of water and 48 ounces of other fluids. Discussed the importance of decreasing his daily fluid intake to closer to 60 ounces of all fluids  - sees cardiologist Fletcher Anon) 05/09/17 - BMP done 04/29/17 reviewed and shows sodium 135, potassium 5.2 and GFR >60 - doubt BP will support entresto  2: HTN- - BP low and patient experiencing dizziness - will stop lisinopril  3: Diabetes- - glucose at home was 221 - has a nonhealing right foot ulcer - saw PCP Vickki Muff) 03/24/17 and returns 05/26/17 - A1c on 04/23/17 was 8.7%  4: Tobacco use- - does not desire to quit smoking - cessation discussed for 3 minutes with him  5: Lymphedema-  stage 2 - has TED hose but hasn't been wearing them and he was instructed to put them on in the morning with removal of them at bedtime - also encouraged to elevate his legs - consider compression boots in the future  Patient did not bring his medications nor a list. Each medication was verbally reviewed with the patient and he was encouraged to bring the bottles to every visit to confirm accuracy of list.  Return in 1  month or sooner for any questions/problems before then.

## 2017-05-02 DIAGNOSIS — I890 Lymphedema, not elsewhere classified: Secondary | ICD-10-CM | POA: Insufficient documentation

## 2017-05-09 ENCOUNTER — Ambulatory Visit: Payer: Self-pay | Admitting: Cardiovascular Disease

## 2017-05-09 ENCOUNTER — Ambulatory Visit (INDEPENDENT_AMBULATORY_CARE_PROVIDER_SITE_OTHER): Payer: Self-pay | Admitting: Cardiovascular Disease

## 2017-05-09 ENCOUNTER — Other Ambulatory Visit
Admission: RE | Admit: 2017-05-09 | Discharge: 2017-05-09 | Disposition: A | Discharge status: Home / Self Care | Payer: Medicaid Other | Source: Ambulatory Visit | Attending: Cardiovascular Disease | Admitting: Cardiovascular Disease

## 2017-05-09 ENCOUNTER — Encounter: Payer: Self-pay | Admitting: Cardiovascular Disease

## 2017-05-09 VITALS — BP 101/72 | HR 90 | Ht 67.0 in | Wt 235.2 lb

## 2017-05-09 DIAGNOSIS — I5022 Chronic systolic (congestive) heart failure: Secondary | ICD-10-CM

## 2017-05-09 DIAGNOSIS — I252 Old myocardial infarction: Secondary | ICD-10-CM

## 2017-05-09 DIAGNOSIS — Z720 Tobacco use: Secondary | ICD-10-CM

## 2017-05-09 DIAGNOSIS — I2510 Atherosclerotic heart disease of native coronary artery without angina pectoris: Secondary | ICD-10-CM

## 2017-05-09 DIAGNOSIS — E782 Mixed hyperlipidemia: Secondary | ICD-10-CM

## 2017-05-09 LAB — BASIC METABOLIC PANEL
Anion gap: 9 (ref 5–15)
BUN: 17 mg/dL (ref 6–20)
CO2: 29 mmol/L (ref 22–32)
Calcium: 9.3 mg/dL (ref 8.9–10.3)
Chloride: 95 mmol/L — ABNORMAL LOW (ref 101–111)
Creatinine, Ser: 0.87 mg/dL (ref 0.61–1.24)
GFR calc Af Amer: >60 mL/min (ref >60)
GFR calc non Af Amer: >60 mL/min (ref >60)
Glucose, Bld: 208 mg/dL — ABNORMAL HIGH (ref 65–99)
Potassium: 4.3 mmol/L (ref 3.5–5.1)
Sodium: 133 mmol/L — ABNORMAL LOW (ref 135–145)

## 2017-05-09 NOTE — Patient Instructions (Signed)
Medication Instructions:  Your physician recommends that you continue on your current medications as directed. Please refer to the Current Medication list given to you today.   Labwork: BMET at the [facility]  Testing/Procedures: none  Follow-Up: Your physician recommends that you schedule a follow-up appointment in: 2 months with Dr. Fletcher Anon.    Any Other Special Instructions Will Be Listed Below (If Applicable).  You have been referred to [facility] and will receive a call to set up an appointment. [place], [place]     If you need a refill on your cardiac medications before your next appointment, please call your pharmacy.

## 2017-05-09 NOTE — Progress Notes (Signed)
Cardiology Office Note   Date:  05/09/2017   ID:  [name] [name], DOB [birth year], MRN [id number]  PCP:  Clarisse Gouge, MD  Cardiologist:   [name], MD   Chief Complaint  Patient presents with  . other    Post cardiac cath pt cut down Furosemide. Meds reviewed verbally with pt.      History of Present Illness: [name] [name] is a 56 y.o. male who presents for a follow-up visit regarding coronary artery disease and chronic systolic heart failure. He has extensive cardiovascular history and multiple chronic medical conditions including diabetes.  He had prior PCI to the LAD in 2013 and PCI to the RCA in 2014.  He had previous SFA stenting.  He has known history of COPD due to previous tobacco use. He was hospitalized recently at [facility] with acute systolic heart failure.  Troponin was minimally elevated. He had an echocardiogram done which showed an EF of 25-30% with grade 2 diastolic dysfunction, moderately dilated left atrium and mild pulmonary hypertension. I proceeded with cardiac catheterization which showed significant three-vessel coronary artery disease.  The stents in the RCA and LAD were patent.  However, there was significant proximal RCA disease before the stent and diffuse mid and distal disease.  There was also significant bifurcation stenosis in the proximal left circumflex with a large OM1.  The LAD had moderate to borderline significant disease in the mid segment.  The coronary arteries were noted to be moderately calcified and diffusely diseased consistent with diabetic vessels.  LVEDP was 34 mmHg.  The patient improved with diuresis and was discharged on medical therapy. He reports improved symptoms although he continues to have shortness of breath and orthopnea.    Past Medical History:  Diagnosis Date  . Arthritis   . Asthma   . Atherosclerosis   . BPH (benign prostatic hyperplasia)   . Chronic diastolic CHF (congestive heart failure) ([place])    a. 02/2013  EF 50% by LV gram.  . COPD (chronic obstructive pulmonary disease) ([place])   . Coronary artery disease    a. Status post LAD PCI in 2013 at [facility];  b. October 2014 - Nuclear stress test showed inferior wall defect->Cath: patent LAD stent with 90% stenosis in the proximal RCA ( 2.5 x 23 mm DES), EF 50%;  c. 02/2014 MV w/ large inferior scar->Cath: LAD 55m, LCX 32m, 20d, RCA 20p/m, patent stent->Med Rx.  . Diabetes mellitus without complication ([place])   . Hernia   . Hyperlipidemia   . Hypertension   . Morbid obesity ([place])   . Neuropathy   . PAD (peripheral artery disease) ([place])    a. Angiography in March of 2012 showed 70-80% proximal right SFA stenosis status post self-expanding stent placement (6X60 mm). Angiography in April 2012 showed proximal left SFA occlusion status post self-expanding stent placement (7 X unkonw length). Both done by Dr. Lucky Cowboy.  . Peripheral vascular disease ([place])   . Sleep apnea   . Syncope and collapse   . Tobacco use    a. 75+ yr hx - still smoking 1ppd, down from 2 ppd.  . Varicose vein     Past Surgical History:  Procedure Laterality Date  . ABDOMINAL AORTAGRAM N/A 06/23/2013   Procedure: ABDOMINAL Maxcine Ham;  Surgeon: Wellington Hampshire, MD;  Location: [place] CATH LAB;  Service: Cardiovascular;  Laterality: N/A;  . APPENDECTOMY    . CARDIAC CATHETERIZATION  10/14   [facility]; X1 STENT PROXIMAL RCA  .  CARDIAC CATHETERIZATION  02/2010   [facility]  . CARDIAC [facility]ATION  [facility]/2014   [facility]  . CLAVICLE SURGERY    . HERNIA REPAIR    . LEFT HEART CATH AND CORS/GRAFTS ANGIOGRAPHY N/A 04/28/2017   Procedure: LEFT HEART CATH AND CORONARY ANGIOGRAPHY;  Surgeon: Wellington Hampshire, MD;  Location: [place] CV LAB;  Service: Cardiovascular;  Laterality: N/A;  . PERIPHERAL ARTERIAL STENT GRAFT     x2 left/right  . PERIPHERAL VASCULAR CATHETERIZATION N/A 07/06/2015   Procedure: Abdominal Aortogram w/Lower Extremity;  Surgeon: Algernon Huxley, MD;  Location: [place] CV LAB;  Service:  Cardiovascular;  Laterality: N/A;  . PERIPHERAL VASCULAR CATHETERIZATION  07/06/2015   Procedure: Lower Extremity Intervention;  Surgeon: Algernon Huxley, MD;  Location: [place] CV LAB;  Service: Cardiovascular;;     Current Outpatient Medications  Medication Sig Dispense Refill  . albuterol (PROVENTIL HFA;VENTOLIN HFA) 108 (90 BASE) MCG/ACT inhaler Inhale 2 puffs into the lungs every 6 (six) hours as needed for wheezing or shortness of breath. 1 Inhaler 2  . aspirin EC 81 MG tablet Take 1 tablet (81 mg total) by mouth daily. 90 tablet 3  . atorvastatin (LIPITOR) 40 MG tablet Take 1 tablet (40 mg total) daily at 6 PM by mouth. 30 tablet 0  . budesonide-formoterol (SYMBICORT) 80-4.5 MCG/ACT inhaler Inhale 2 puffs as needed into the lungs.     . carvedilol (COREG) 3.125 MG tablet Take 1 tablet (3.125 mg total) 2 (two) times daily with a meal by mouth. 60 tablet 0  . clonazePAM (KLONOPIN) 1 MG tablet Take 1 mg by mouth at bedtime.    . clopidogrel (PLAVIX) 75 MG tablet Take 1 tablet (75 mg total) daily by mouth. 30 tablet 0  . collagenase (SANTYL) ointment Apply topically daily. 15 g 0  . fluticasone furoate-vilanterol (BREO ELLIPTA) 100-25 MCG/INH AEPB Inhale 1 puff daily into the lungs.    . furosemide (LASIX) 40 MG tablet Take 1 tablet (40 mg total) 2 (two) times daily by mouth. 60 tablet 11  . gabapentin (NEURONTIN) 800 MG tablet Take 1,600 mg by mouth 3 (three) times daily.     [name] glipiZIDE (GLUCOTROL) 10 MG tablet Take 10 mg by mouth daily before breakfast.    . insulin glargine (LANTUS) 100 UNIT/ML injection Inject 0.4 mLs (40 Units total) at bedtime into the skin. 10 mL 11  . insulin lispro (HUMALOG) 100 UNIT/ML injection Inject 0-10 Units into the skin 3 (three) times daily with meals. If BS is 0 to 70 no insulin and eat/glucose tablets/glucose get If BS 71 to 150 great! If BS 151 to 200, 2 Units Humaog If BS 201 to 250, 4 Units Humalog If BS 251 to 300, 6 Units Humalog If BS 301 to  350, 8 Units Humalog If BS 351 to 400, 10 Units Humalog If BS greater than 400, take 10 Units and Contact our office during office hour    . isosorbide mononitrate (IMDUR) 30 MG 24 hr tablet Take 1 tablet (30 mg total) by mouth daily. 30 tablet 2  . metFORMIN (GLUCOPHAGE) 1000 MG tablet Take 1,000 mg by mouth 2 (two) times daily with a meal.    . nitroGLYCERIN (NITRODUR - DOSED IN MG/24 HR) 0.4 mg/hr patch Place 0.4 mg onto the skin daily.    . nitroGLYCERIN (NITROSTAT) 0.4 MG SL tablet Place 0.4 mg under the tongue every 5 (five) minutes as needed for chest pain.    [name] oxyCODONE-acetaminophen (PERCOCET) 10-325  MG tablet Take by mouth.    . nicotine (NICODERM CQ - DOSED IN MG/24 HOURS) 14 mg/24hr patch Place 1 patch (14 mg total) daily onto the skin. (Patient not taking: Reported on 04/30/2017) 28 patch 0  . potassium chloride (K-DUR) 10 MEQ tablet Take 1 tablet (10 mEq total) by mouth daily. 7 tablet 0   No current facility-administered medications for this visit.     Allergies:   Dulaglutide    Social History:  The patient  reports that he has been smoking cigarettes.  He has a 12.30 pack-year smoking history. he has never used smokeless tobacco. He reports that he does not drink alcohol or use drugs.   Family History:  The patient's family history includes Heart attack in his father; Heart disease in his father; Hyperlipidemia in his father and mother; Hypertension in his father and mother.    ROS:  Please see the history of present illness.   Otherwise, review of systems are positive for none.   All other systems are reviewed and negative.    PHYSICAL EXAM: VS:  BP 101/72 (BP Location: Left Arm, Patient Position: Sitting, Cuff Size: Normal)   Pulse 90   Ht 5\' 7"  (1.702 m)   Wt 235 lb 4 oz (106.7 kg)   BMI 36.85 kg/m  , BMI Body mass index is 36.85 kg/m. GEN: Well nourished, well developed, in no acute distress  HEENT: normal  Neck: no JVD, carotid bruits, or masses Cardiac:  RRR; no murmurs, rubs, or gallops,no edema  Respiratory:  clear to auscultation bilaterally, normal work of breathing GI: soft, nontender, nondistended, + BS MS: no deformity or atrophy  Skin: warm and dry, no rash Neuro:  Strength and sensation are intact Psych: euthymic mood, full affect   EKG:  EKG is ordered today. The ekg ordered today demonstrates normal sinus rhythm with incomplete left bundle branch block.   Recent Labs: 04/22/2017: B Natriuretic Peptide 1,528.0 04/29/2017: BUN 24; Creatinine, Ser 0.78; Hemoglobin 13.3; Platelets 279; Potassium 5.2; Sodium 135    Lipid Panel    Component Value Date/Time   CHOL 147 04/23/2017 0409   CHOL 211 (H) 04/05/2013 0520   TRIG 87 04/23/2017 0409   TRIG 321 (H) 04/05/2013 0520   HDL 23 (L) 04/23/2017 0409   HDL 34 (L) 04/05/2013 0520   CHOLHDL 6.4 04/23/2017 0409   VLDL 17 04/23/2017 0409   VLDL 64 (H) 04/05/2013 0520   LDLCALC 107 (H) 04/23/2017 0409   LDLCALC 113 (H) 04/05/2013 0520      Wt Readings from Last 3 Encounters:  05/09/17 235 lb 4 oz (106.7 kg)  04/30/17 246 lb (111.6 kg)  04/29/17 235 lb 14.4 oz (107 kg)     No flowsheet data found.    ASSESSMENT AND PLAN:  1.  Coronary artery disease involving native coronary arteries with recent unstable angina and new systolic dysfunction: This is overall a difficult situation.  Revascularization options are suboptimal due to diffuse disease overall.  Given that he is diabetic and has cardiomyopathy, best option is likely CABG .    It is clear that in spite of previous PCI, he keeps developing significant obstructive disease in other non-stented segments likely due to his diabetic status.  I am referring him to cardiothoracic surgery for evaluation.  Once he is deemed to be a candidate for CABG, recommend stopping Plavix 5-7 days before surgery.  2.  Chronic systolic heart failure: Due to ischemic cardiomyopathy with an EF of 25-30%.  No significant mitral  regurgitation.  Continue treatment with carvedilol.  He appears to be euvolemic on current dose of furosemide. Lisinopril is on hold due to relatively low blood pressure with hyperkalemia while hospitalized.  I requested basic metabolic profile.  3.  Hyperlipidemia: Continue atorvastatin.  4.  Tobacco use: I strongly advised him to quit smoking.  Disposition:   FU with me in 2 months  Signed,  [name], MD  05/09/2017 3:16 PM    [place]

## 2017-05-14 ENCOUNTER — Ambulatory Visit: Payer: Self-pay | Admitting: Nurse Practitioner

## 2017-05-21 ENCOUNTER — Encounter: Payer: Self-pay | Admitting: Cardiothoracic Surgery

## 2017-05-28 ENCOUNTER — Ambulatory Visit: Payer: Self-pay | Attending: Family | Admitting: Family

## 2017-05-28 ENCOUNTER — Telehealth: Payer: Self-pay | Admitting: Pharmacist

## 2017-05-28 ENCOUNTER — Encounter: Payer: Self-pay | Admitting: Family

## 2017-05-28 ENCOUNTER — Other Ambulatory Visit: Payer: Self-pay

## 2017-05-28 VITALS — BP 157/76 | HR 110 | Resp 18 | Ht 67.0 in | Wt 234.1 lb

## 2017-05-28 DIAGNOSIS — Z79899 Other long term (current) drug therapy: Secondary | ICD-10-CM | POA: Insufficient documentation

## 2017-05-28 DIAGNOSIS — I5042 Chronic combined systolic (congestive) and diastolic (congestive) heart failure: Secondary | ICD-10-CM | POA: Insufficient documentation

## 2017-05-28 DIAGNOSIS — E11621 Type 2 diabetes mellitus with foot ulcer: Secondary | ICD-10-CM

## 2017-05-28 DIAGNOSIS — E785 Hyperlipidemia, unspecified: Secondary | ICD-10-CM | POA: Insufficient documentation

## 2017-05-28 DIAGNOSIS — I5022 Chronic systolic (congestive) heart failure: Secondary | ICD-10-CM

## 2017-05-28 DIAGNOSIS — Z8249 Family history of ischemic heart disease and other diseases of the circulatory system: Secondary | ICD-10-CM | POA: Insufficient documentation

## 2017-05-28 DIAGNOSIS — Z955 Presence of coronary angioplasty implant and graft: Secondary | ICD-10-CM | POA: Insufficient documentation

## 2017-05-28 DIAGNOSIS — L97509 Non-pressure chronic ulcer of other part of unspecified foot with unspecified severity: Secondary | ICD-10-CM

## 2017-05-28 DIAGNOSIS — Z720 Tobacco use: Secondary | ICD-10-CM

## 2017-05-28 DIAGNOSIS — Z794 Long term (current) use of insulin: Secondary | ICD-10-CM | POA: Insufficient documentation

## 2017-05-28 DIAGNOSIS — G4733 Obstructive sleep apnea (adult) (pediatric): Secondary | ICD-10-CM | POA: Insufficient documentation

## 2017-05-28 DIAGNOSIS — I10 Essential (primary) hypertension: Secondary | ICD-10-CM

## 2017-05-28 DIAGNOSIS — Z9889 Other specified postprocedural states: Secondary | ICD-10-CM | POA: Insufficient documentation

## 2017-05-28 DIAGNOSIS — E1140 Type 2 diabetes mellitus with diabetic neuropathy, unspecified: Secondary | ICD-10-CM | POA: Insufficient documentation

## 2017-05-28 DIAGNOSIS — J449 Chronic obstructive pulmonary disease, unspecified: Secondary | ICD-10-CM | POA: Insufficient documentation

## 2017-05-28 DIAGNOSIS — Z7902 Long term (current) use of antithrombotics/antiplatelets: Secondary | ICD-10-CM | POA: Insufficient documentation

## 2017-05-28 DIAGNOSIS — Z7982 Long term (current) use of aspirin: Secondary | ICD-10-CM | POA: Insufficient documentation

## 2017-05-28 DIAGNOSIS — F17210 Nicotine dependence, cigarettes, uncomplicated: Secondary | ICD-10-CM | POA: Insufficient documentation

## 2017-05-28 DIAGNOSIS — E1151 Type 2 diabetes mellitus with diabetic peripheral angiopathy without gangrene: Secondary | ICD-10-CM | POA: Insufficient documentation

## 2017-05-28 DIAGNOSIS — R42 Dizziness and giddiness: Secondary | ICD-10-CM | POA: Insufficient documentation

## 2017-05-28 DIAGNOSIS — Z888 Allergy status to other drugs, medicaments and biological substances status: Secondary | ICD-10-CM | POA: Insufficient documentation

## 2017-05-28 DIAGNOSIS — N400 Enlarged prostate without lower urinary tract symptoms: Secondary | ICD-10-CM | POA: Insufficient documentation

## 2017-05-28 DIAGNOSIS — I2510 Atherosclerotic heart disease of native coronary artery without angina pectoris: Secondary | ICD-10-CM | POA: Insufficient documentation

## 2017-05-28 DIAGNOSIS — R079 Chest pain, unspecified: Secondary | ICD-10-CM | POA: Insufficient documentation

## 2017-05-28 DIAGNOSIS — I110 Hypertensive heart disease with heart failure: Secondary | ICD-10-CM | POA: Insufficient documentation

## 2017-05-28 NOTE — Progress Notes (Signed)
Patient ID: [name] [name], male    DOB: 04-Nov-1960, 56 y.o.   MRN: [id number]  HPI  [name] [name] is a 56 y/o male with a history of obstructive sleep apnea, PVD, PAD, HTN, hyperlipidemia, DM, CAD, COPD, BPH, asthma, current tobacco use and chronic heart failure.   Echo report from 04/25/17 reviewed and shows an EF of 25-30% along with a PA pressure of 34 mm Hg. EF has declined from 55-60% back in 2016. Cardiac catheterization done 04/28/17 showed significant three-vessel disease with a patient stent in the RCA and LAD. Significant proximal RCS disease and the stent as well as diffuse mid and distal disease. LAD has moderate disease. Severely elevated left ventricular end-diastolic pressure at 34 mmHg. Possible CABG in the future with optimizing medical management. Stress done in 2015.  Admitted 04/22/17 due to HF exacerbation. Cardiology consult obtained. Cardiac catheterization done showing diffuse three-vessel disease. To have an outpatient consult regarding possible CABG in the future. Medications were adjusted and he was discharged home after 7 days. Was in the ED 02/14/17 due to elbow/shoulder pain after a mechanical fall. Patient hit his head but no LOC. Xrays done which ruled out fractures. Released home.  He presents today for a follow-up visit with a chief complaint of minimal shortness of breath upon moderate exertion. He describes this as chronic in nature having been present for several years with varying levels of severity. He has associated fatigue, wheezing, chest pain, light-headedness and easy bruising along with this. He denies any edema, palpitations, abdominal distention, difficulty sleeping or weight gain. Is planning on having all care done at [facility] as he's been approved for charity care through [place].   Past Medical History:  Diagnosis Date  . Arthritis   . Asthma   . Atherosclerosis   . BPH (benign prostatic hyperplasia)   . Chronic diastolic CHF (congestive heart failure) ([place])     a. 02/2013 EF 50% by LV gram.  . COPD (chronic obstructive pulmonary disease) ([place])   . Coronary artery disease    a. Status post LAD PCI in 2013 at [facility];  b. October 2014 - Nuclear stress test showed inferior wall defect->Cath: patent LAD stent with 90% stenosis in the proximal RCA ( 2.5 x 23 mm DES), EF 50%;  c. 02/2014 MV w/ large inferior scar->Cath: LAD 58m, LCX 40m, 20d, RCA 20p/m, patent stent->Med Rx.  . Diabetes mellitus without complication ([place])   . Hernia   . Hyperlipidemia   . Hypertension   . Morbid obesity ([place])   . Neuropathy   . PAD (peripheral artery disease) ([place])    a. Angiography in March of 2012 showed 70-80% proximal right SFA stenosis status post self-expanding stent placement (6X60 mm). Angiography in April 2012 showed proximal left SFA occlusion status post self-expanding stent placement (7 X unkonw length). Both done by Dr. Lucky Cowboy.  . Peripheral vascular disease ([place])   . Sleep apnea   . Syncope and collapse   . Tobacco use    a. 75+ yr hx - still smoking 1ppd, down from 2 ppd.  . Varicose vein    Past Surgical History:  Procedure Laterality Date  . ABDOMINAL AORTAGRAM N/A 06/23/2013   Procedure: ABDOMINAL Maxcine Ham;  Surgeon: Wellington Hampshire, MD;  Location: [place] CATH LAB;  Service: Cardiovascular;  Laterality: N/A;  . APPENDECTOMY    . CARDIAC CATHETERIZATION  10/14   [facility]; X1 STENT PROXIMAL RCA  . CARDIAC CATHETERIZATION  02/2010   [facility]  . CARDIAC  CATHETER[facility]  [facility]  . CLAVICLE SURGERY    . HERNIA REPAIR    . LEFT HEART CATH AND CORS/GRAFTS ANGIOGRAPHY N/A 04/28/2017   Procedure: LEFT HEART CATH AND CORONARY ANGIOGRAPHY;  Surgeon: Wellington Hampshire, MD;  Location: [place] CV LAB;  Service: Cardiovascular;  Laterality: N/A;  . PERIPHERAL ARTERIAL STENT GRAFT     x2 left/right  . PERIPHERAL VASCULAR CATHETERIZATION N/A 07/06/2015   Procedure: Abdominal Aortogram w/Lower Extremity;  Surgeon: Algernon Huxley, MD;  Location: [place] CV LAB;   Service: Cardiovascular;  Laterality: N/A;  . PERIPHERAL VASCULAR CATHETERIZATION  07/06/2015   Procedure: Lower Extremity Intervention;  Surgeon: Algernon Huxley, MD;  Location: [place] CV LAB;  Service: Cardiovascular;;   Family History  Problem Relation Age of Onset  . Heart attack Father   . Heart disease Father   . Hypertension Father   . Hyperlipidemia Father   . Hypertension Mother   . Hyperlipidemia Mother    Social History   Tobacco Use  . Smoking status: Current Every Day Smoker    Packs/day: 0.30    Years: 41.00    Pack years: 12.30    Types: Cigarettes  . Smokeless tobacco: Never Used  Substance Use Topics  . Alcohol use: No   Allergies  Allergen Reactions  . Dulaglutide Anaphylaxis, Diarrhea and Hives   Prior to Admission medications   Medication Sig Start Date End Date Taking? Authorizing Provider  albuterol (PROVENTIL HFA;VENTOLIN HFA) 108 (90 BASE) MCG/ACT inhaler Inhale 2 puffs into the lungs every 6 (six) hours as needed for wheezing or shortness of breath. 04/15/13  Yes Wellington Hampshire, MD  aspirin EC 81 MG tablet Take 1 tablet (81 mg total) by mouth daily. 05/11/15  Yes Wellington Hampshire, MD  atorvastatin (LIPITOR) 40 MG tablet Take 1 tablet (40 mg total) daily at 6 PM by mouth. 04/29/17 05/29/17 Yes Max Sane, MD  budesonide-formoterol (SYMBICORT) 80-4.5 MCG/ACT inhaler Inhale 2 puffs as needed into the lungs.    Yes [provider]  carvedilol (COREG) 3.125 MG tablet Take 1 tablet (3.125 mg total) 2 (two) times daily with a meal by mouth. 04/29/17  Yes Max Sane, MD  clonazePAM (KLONOPIN) 1 MG tablet Take 1 mg by mouth at bedtime.   Yes [provider]  clopidogrel (PLAVIX) 75 MG tablet Take 1 tablet (75 mg total) daily by mouth. 04/29/17  Yes Max Sane, MD  collagenase (SANTYL) ointment Apply topically daily. 05/08/16  Yes Mody, Sital, MD  fluticasone furoate-vilanterol (BREO ELLIPTA) 100-25 MCG/INH AEPB Inhale 1 puff daily into  the lungs. 08/15/16  Yes [provider]  furosemide (LASIX) 40 MG tablet Take 1 tablet (40 mg total) 2 (two) times daily by mouth. 04/29/17 04/29/18 Yes Max Sane, MD  gabapentin (NEURONTIN) 800 MG tablet Take 1,600 mg by mouth 3 (three) times daily.    Yes [provider]  glipiZIDE (GLUCOTROL) 10 MG tablet Take 10 mg by mouth daily before breakfast.   Yes [provider]  insulin glargine (LANTUS) 100 UNIT/ML injection Inject 0.4 mLs (40 Units total) at bedtime into the skin. 04/25/17  Yes Epifanio Lesches, MD  insulin lispro (HUMALOG) 100 UNIT/ML injection Inject 0-10 Units into the skin 3 (three) times daily with meals. If BS is 0 to 70 no insulin and eat/glucose tablets/glucose get If BS 71 to 150 great! If BS 151 to 200, 2 Units Humaog If BS 201 to 250, 4 Units Humalog If  BS 251 to 300, 6 Units Humalog If BS 301 to 350, 8 Units Humalog If BS 351 to 400, 10 Units Humalog If BS greater than 400, take 10 Units and Contact our office during office hour   Yes [provider]  isosorbide mononitrate (IMDUR) 30 MG 24 hr tablet Take 1 tablet (30 mg total) by mouth daily. 07/11/16  Yes Demetrios Loll, MD  metFORMIN (GLUCOPHAGE) 1000 MG tablet Take 1,000 mg by mouth 2 (two) times daily with a meal.   Yes [provider]  nicotine (NICODERM CQ - DOSED IN MG/24 HOURS) 14 mg/24hr patch Place 1 patch (14 mg total) daily onto the skin. 04/29/17  Yes Max Sane, MD  nitroGLYCERIN (NITRODUR - DOSED IN MG/24 HR) 0.4 mg/hr patch Place 0.4 mg onto the skin daily.   Yes [provider]  nitroGLYCERIN (NITROSTAT) 0.4 MG SL tablet Place 0.4 mg under the tongue every 5 (five) minutes as needed for chest pain.   Yes [provider]  potassium chloride (K-DUR) 10 MEQ tablet Take 1 tablet (10 mEq total) by mouth daily. Patient not taking: Reported on 05/09/2017 07/18/16 04/30/17  Wende Bushy, MD   Review of Systems  Constitutional: Positive for fatigue.  Negative for appetite change.  HENT: Negative for congestion, postnasal drip and sore throat.   Eyes: Positive for visual disturbance (blurry vision).  Respiratory: Positive for shortness of breath and wheezing. Negative for cough and chest tightness.   Cardiovascular: Positive for chest pain (this morning ). Negative for palpitations and leg swelling.  Gastrointestinal: Negative for abdominal distention and abdominal pain.  Endocrine: Negative.   Genitourinary: Negative.   Musculoskeletal: Positive for back pain. Negative for neck pain.  Skin: Positive for wound (nonhealing ulcer right foot).  Allergic/Immunologic: Negative.   Neurological: Positive for light-headedness. Negative for dizziness.  Hematological: Negative for adenopathy. Bruises/bleeds easily.  Psychiatric/Behavioral: Negative for dysphoric mood and sleep disturbance (sleeping on 2 pillows). The patient is not nervous/anxious.    Vitals:   05/28/17 1047  BP: (!) 157/76  Pulse: (!) 110  Resp: 18  SpO2: 100%  Weight: 234 lb 2 oz (106.2 kg)  Height: 5\' 7"  (1.702 m)   Wt Readings from Last 3 Encounters:  05/28/17 234 lb 2 oz (106.2 kg)  05/09/17 235 lb 4 oz (106.7 kg)  04/30/17 246 lb (111.6 kg)    Lab Results  Component Value Date   CREATININE 0.87 05/09/2017   CREATININE 0.78 04/29/2017   CREATININE 1.05 04/28/2017   Physical Exam  Constitutional: He is oriented to person, place, and time. He appears well-developed and well-nourished.  HENT:  Head: Normocephalic and atraumatic.  Neck: Normal range of motion. Neck supple. No JVD present.  Cardiovascular: Normal rate. An irregular rhythm present.  Pulmonary/Chest: Effort normal. He has no wheezes. He has no rales.  Abdominal: Soft. He exhibits no distension. There is no tenderness.  Musculoskeletal: He exhibits no edema or tenderness.  Neurological: He is alert and oriented to person, place, and time.  Skin: Skin is warm and dry.  Psychiatric: He has a normal  mood and affect. His behavior is normal. Thought content normal.  Nursing note and vitals reviewed.   Assessment & Plan:  1: Chronic heart failure with reduced ejection fraction- - NYHA class II - euvolemic today - weighing daily and he was reminded to call for an overnight weight gain of >2 pounds or a weekly weight gain of >5 pounds - weight down 8 pounds since he was  last here - not adding salt and has been using [employer] & trying to read food labels. Has not eaten at [facility] since he was last here - has tried to decrease fluid intake to closer to 60 ounces daily - saw cardiologist Fletcher Anon) 05/09/17 - BMP done 05/09/17 reviewed and shows sodium 133, potassium 4.3 and GFR >60  2: HTN- - BP mildly elevated today although he hasn't taken any of his medications yet today - lisinopril was stopped at last visit  3: Diabetes- - glucose at home has been running in the 120's - has a nonhealing right foot ulcer that he gets followed by the wound center - saw PCP Vickki Muff) 05/26/17 - A1c on 04/23/17 was 8.7%  4: Tobacco use- - currently smoking <1/2 ppd of cigarettes - complete cessation discussed for 3 minutes with him  Patient did not bring his medications nor a list. Each medication was verbally reviewed with the patient and he was encouraged to bring the bottles to every visit to confirm accuracy of list.  Patient does not plan on making a follow-up appointment at this time. Says that he's been approved for [facility] so will be getting all his care down at [facility] with plans for possible CABG in the future, per patient. Advised patient that he could call back at anytime to make another appointment.

## 2017-05-28 NOTE — Patient Instructions (Addendum)
Continue weighing daily and call for an overnight weight gain of > 2 pounds or a weekly weight gain of >5 pounds.    Smoking Cessation Quitting smoking is important to your health and has many advantages. However, it is not always easy to quit since nicotine is a very addictive drug. Oftentimes, people try 3 times or more before being able to quit. This document explains the best ways for you to prepare to quit smoking. Quitting takes hard work and a lot of effort, but you can do it. ADVANTAGES OF QUITTING SMOKING  You will live longer, feel better, and live better.  Your body will feel the impact of quitting smoking almost immediately.  Within 20 minutes, blood pressure decreases. Your pulse returns to its normal level.  After 8 hours, carbon monoxide levels in the blood return to normal. Your oxygen level increases.  After 24 hours, the chance of having a heart attack starts to decrease. Your breath, hair, and body stop smelling like smoke.  After 48 hours, damaged nerve endings begin to recover. Your sense of taste and smell improve.  After 72 hours, the body is virtually free of nicotine. Your bronchial tubes relax and breathing becomes easier.  After 2 to 12 weeks, lungs can hold more air. Exercise becomes easier and circulation improves.  The risk of having a heart attack, stroke, cancer, or lung disease is greatly reduced.  After 1 year, the risk of coronary heart disease is cut in half.  After 5 years, the risk of stroke falls to the same as a nonsmoker.  After 10 years, the risk of lung cancer is cut in half and the risk of other cancers decreases significantly.  After 15 years, the risk of coronary heart disease drops, usually to the level of a nonsmoker.  If you are pregnant, quitting smoking will improve your chances of having a healthy baby.  The people you live with, especially any children, will be healthier.  You will have extra money to spend on things other  than cigarettes. QUESTIONS TO THINK ABOUT BEFORE ATTEMPTING TO QUIT You may want to talk about your answers with your health care provider.  Why do you want to quit?  If you tried to quit in the past, what helped and what did not?  What will be the most difficult situations for you after you quit? How will you plan to handle them?  Who can help you through the tough times? Your family? Friends? A health care provider?  What pleasures do you get from smoking? What ways can you still get pleasure if you quit? Here are some questions to ask your health care provider:  How can you help me to be successful at quitting?  What medicine do you think would be best for me and how should I take it?  What should I do if I need more help?  What is smoking withdrawal like? How can I get information on withdrawal? GET READY  Set a quit date.  Change your environment by getting rid of all cigarettes, ashtrays, matches, and lighters in your home, car, or work. Do not let people smoke in your home.  Review your past attempts to quit. Think about what worked and what did not. GET SUPPORT AND ENCOURAGEMENT You have a better chance of being successful if you have help. You can get support in many ways.  Tell your family, friends, and coworkers that you are going to quit and need their support. Ask   them not to smoke around you.  Get individual, group, or telephone counseling and support. Programs are available at local hospitals and health centers. Call your local health department for information about programs in your area.  Spiritual beliefs and practices may help some smokers quit.  Download a "quit meter" on your computer to keep track of quit statistics, such as how long you have gone without smoking, cigarettes not smoked, and money saved.  Get a self-help book about quitting smoking and staying off tobacco. LEARN NEW SKILLS AND BEHAVIORS  Distract yourself from urges to smoke. Talk to  someone, go for a walk, or occupy your time with a task.  Change your normal routine. Take a different route to work. Drink tea instead of coffee. Eat breakfast in a different place.  Reduce your stress. Take a hot bath, exercise, or read a book.  Plan something enjoyable to do every day. Reward yourself for not smoking.  Explore interactive web-based programs that specialize in helping you quit. GET MEDICINE AND USE IT CORRECTLY Medicines can help you stop smoking and decrease the urge to smoke. Combining medicine with the above behavioral methods and support can greatly increase your chances of successfully quitting smoking.  Nicotine replacement therapy helps deliver nicotine to your body without the negative effects and risks of smoking. Nicotine replacement therapy includes nicotine gum, lozenges, inhalers, nasal sprays, and skin patches. Some may be available over-the-counter and others require a prescription.  Antidepressant medicine helps people abstain from smoking, but how this works is unknown. This medicine is available by prescription.  Nicotinic receptor partial agonist medicine simulates the effect of nicotine in your brain. This medicine is available by prescription. Ask your health care provider for advice about which medicines to use and how to use them based on your health history. Your health care provider will tell you what side effects to look out for if you choose to be on a medicine or therapy. Carefully read the information on the package. Do not use any other product containing nicotine while using a nicotine replacement product.  RELAPSE OR DIFFICULT SITUATIONS Most relapses occur within the first 3 months after quitting. Do not be discouraged if you start smoking again. Remember, most people try several times before finally quitting. You may have symptoms of withdrawal because your body is used to nicotine. You may crave cigarettes, be irritable, feel very hungry, cough  often, get headaches, or have difficulty concentrating. The withdrawal symptoms are only temporary. They are strongest when you first quit, but they will go away within 10-14 days. To reduce the chances of relapse, try to:  Avoid drinking alcohol. Drinking lowers your chances of successfully quitting.  Reduce the amount of caffeine you consume. Once you quit smoking, the amount of caffeine in your body increases and can give you symptoms, such as a rapid heartbeat, sweating, and anxiety.  Avoid smokers because they can make you want to smoke.  Do not let weight gain distract you. Many smokers will gain weight when they quit, usually less than 10 pounds. Eat a healthy diet and stay active. You can always lose the weight gained after you quit.  Find ways to improve your mood other than smoking. FOR MORE INFORMATION  www.smokefree.gov  Document Released: 05/21/2001 Document Revised: 10/11/2013 Document Reviewed: 09/05/2011 ExitCare Patient Information 2015 [employer]. This information is not intended to replace advice given to you by your health care provider. Make sure you discuss any questions you have with your   health care provider.  

## 2017-05-28 NOTE — Telephone Encounter (Signed)
05/28/17 Faxing refill request to [facility] for Lantus Solostar pen Inject 100 units under the skin once daily, # 6.Delos Haring

## 2017-06-13 ENCOUNTER — Telehealth: Payer: Self-pay | Admitting: Pharmacist

## 2017-06-13 NOTE — Telephone Encounter (Signed)
06/13/17 Placed refill online with [facility] for Ventolin HFA 10mcg & Breo Ellipta 100-25 mcg. will release 06/26/17, order# [id number].Delos Haring

## 2017-07-03 ENCOUNTER — Encounter: Payer: Self-pay | Admitting: Cardiothoracic Surgery

## 2017-07-10 ENCOUNTER — Ambulatory Visit: Payer: Self-pay | Admitting: Cardiovascular Disease

## 2017-07-16 ENCOUNTER — Encounter: Payer: Self-pay | Admitting: Cardiovascular Disease

## 2017-07-22 ENCOUNTER — Telehealth: Payer: Self-pay | Admitting: Pharmacist

## 2017-07-22 NOTE — Telephone Encounter (Signed)
07/22/17 Received a letter from [facility] dated 07/14/17 stating patient enrollment ends 09/12/17. I have printed renewal [place] application mailing patient his portion to sign & return with income/support & taxes/[id number]. Also printed scripts for Ventolin HFA 59mcg Inhale 2 puffs every 6 hours as needed & Breo Ellipta 100/39mcg Inhale 1 puff daily-mailing to Dr. Kirkland Hun in [place] to sign & return for renewal.AJ

## 2017-08-07 ENCOUNTER — Telehealth: Payer: Self-pay | Admitting: Pharmacist

## 2017-08-07 NOTE — Telephone Encounter (Signed)
08/07/17 I have invoice dated 06/04/17 where we received 3 boxes of Lantus Solostar pens that patient picked up 07/04/17. According to note and prior discussion with Christan, patient should have enough till April. Since patient enrollment ends 09/16/17, I have printed [employer] application, mailing patient his portion to sign & return with current income/taxes, also mailing provider Kirkland Hun, MD her portion to sign & return.Delos Haring

## 2017-08-12 ENCOUNTER — Telehealth: Payer: Self-pay | Admitting: Pharmacist

## 2017-08-12 NOTE — Telephone Encounter (Signed)
08/12/2017 2:46:57 PM - Humalog Kwikpen refill  08/12/17 Mailing [employer] refill request for Humalog Claiborne Rigg Max daily dose 30 units #3, to provider Dr. Kirkland Hun @ [facility]. [place], [place] [id number] to sign and return.Delos Haring

## 2017-08-13 DIAGNOSIS — I208 Other forms of angina pectoris: Secondary | ICD-10-CM | POA: Insufficient documentation

## 2017-08-13 DIAGNOSIS — I2089 Other forms of angina pectoris: Secondary | ICD-10-CM

## 2017-08-13 HISTORY — DX: Other forms of angina pectoris: I20.8

## 2017-08-13 HISTORY — DX: Other forms of angina pectoris: I20.89

## 2017-08-14 ENCOUNTER — Encounter: Payer: Self-pay | Admitting: Cardiothoracic Surgery

## 2017-08-15 ENCOUNTER — Telehealth: Payer: Self-pay | Admitting: Pharmacy Technician

## 2017-08-15 NOTE — Telephone Encounter (Signed)
Patient failed to provide 2019 financial documentation.  No additional medication assistance will be provided by [facility] without the required proof of income documentation.  Patient notified by letter.  Meeyah Ovitt J. Marcelline Temkin Care Manager [facility] 

## 2017-09-05 ENCOUNTER — Telehealth: Payer: Self-pay | Admitting: *Deleted

## 2017-09-08 DIAGNOSIS — I209 Angina pectoris, unspecified: Secondary | ICD-10-CM | POA: Insufficient documentation

## 2017-09-10 DIAGNOSIS — E785 Hyperlipidemia, unspecified: Secondary | ICD-10-CM | POA: Insufficient documentation

## 2017-09-10 DIAGNOSIS — I25118 Atherosclerotic heart disease of native coronary artery with other forms of angina pectoris: Secondary | ICD-10-CM

## 2017-09-10 HISTORY — DX: Atherosclerotic heart disease of native coronary artery with other forms of angina pectoris: I25.118

## 2017-09-11 ENCOUNTER — Encounter: Payer: Self-pay | Admitting: Pharmacist

## 2017-09-11 ENCOUNTER — Encounter (INDEPENDENT_AMBULATORY_CARE_PROVIDER_SITE_OTHER): Payer: Self-pay

## 2017-09-11 ENCOUNTER — Ambulatory Visit: Payer: Self-pay | Admitting: Pharmacist

## 2017-09-11 VITALS — BP 132/76 | Ht 67.0 in | Wt 252.0 lb

## 2017-09-11 DIAGNOSIS — Z79899 Other long term (current) drug therapy: Secondary | ICD-10-CM

## 2017-09-11 NOTE — Progress Notes (Signed)
[facility] Visit Note  Patient: [name] [name] MRN: [id number] Date of Birth: 1960-11-05 PCP: [name] Gouge, MD Cardiologist: [name] Spain, MD  [name] [name] 57 y.o. male presents for an initial MTM visit today. Primary objective of today's visit is to reconcile medications and assess compliance/adherence.  BP 132/76 (BP Location: Right Arm, Patient Position: Sitting, Cuff Size: Large)   Ht 5\' 7"  (1.702 m)   Wt 252 lb (114.3 kg)   BMI 39.47 kg/m   Patient Information   Past Medical History:  Diagnosis Date  . Arthritis   . Asthma   . Atherosclerosis   . BPH (benign prostatic hyperplasia)   . Chronic diastolic CHF (congestive heart failure) ([place])    a. 02/2013 EF 50% by LV gram.  . COPD (chronic obstructive pulmonary disease) ([place])   . Coronary artery disease    a. Status post LAD PCI in 2013 at [facility];  b. October 2014 - Nuclear stress test showed inferior wall defect->Cath: patent LAD stent with 90% stenosis in the proximal RCA ( 2.5 x 23 mm DES), EF 50%;  c. 02/2014 MV w/ large inferior scar->Cath: LAD 17m, LCX 72m, 20d, RCA 20p/m, patent stent->Med Rx.  . Diabetes mellitus without complication ([place])   . Hernia   . Hyperlipidemia   . Hypertension   . Morbid obesity ([place])   . Neuropathy   . PAD (peripheral artery disease) ([place])    a. Angiography in March of 2012 showed 70-80% proximal right SFA stenosis status post self-expanding stent placement (6X60 mm). Angiography in April 2012 showed proximal left SFA occlusion status post self-expanding stent placement (7 X unkonw length). Both done by Dr. Lucky Cowboy.  . Peripheral vascular disease ([place])   . Sleep apnea   . Syncope and collapse   . Tobacco use    a. 75+ yr hx - still smoking 1ppd, down from 2 ppd.  . Varicose vein       Past Surgical History:  Procedure Laterality Date  . ABDOMINAL AORTAGRAM N/A 06/23/2013   Procedure: ABDOMINAL Maxcine Ham;  Surgeon: Wellington Hampshire, MD;  Location: [facility];   Service: Cardiovascular;  Laterality: N/A;  . APPENDECTOMY    . CARDIAC CATHETERIZATION  10/14   [facility]; X1 STENT PROXIMAL RCA  . CARDIAC CATHETERIZATION  02/2010   [facility]  . CARDIAC CATHE[facility]IZATION  [facility]/2014   [facility]  . CLAVICLE SURGERY    . HERNIA REPAIR    . LEFT HEART CATH AND CORS/GRAFTS ANGIOGRAPHY N/A 04/28/2017   Procedure: LEFT HEART CATH AND CORONARY ANGIOGRAPHY;  Surgeon: Wellington Hampshire, MD;  Location: [facility];  Service: Cardiovascular;  Laterality: N/A;  . PERIPHERAL ARTERIAL STENT GRAFT     x2 left/right  . PERIPHERAL VASCULAR CATHETERIZATION N/A 07/06/2015   Procedure: Abdominal Aortogram w/Lower Extremity;  Surgeon: Algernon Huxley, MD;  Location: [facility];  Service: Cardiovascular;  Laterality: N/A;  . PERIPHERAL VASCULAR CATHETERIZATION  07/06/2015   Procedure: Lower Extremity Intervention;  Surgeon: Algernon Huxley, MD;  Location: [facility];  Service: Cardiovascular;;     Family History  Problem Relation Age of Onset  . Heart attack Father   . Heart disease Father   . Hypertension Father   . Hyperlipidemia Father   . Hypertension Mother   . Hyperlipidemia Mother     New Diagnoses (since last visit): n/a  Family Support: lives with girlfriend  Lifestyle Diet: Breakfast:boiled eggs Lunch: sandwich, subway Dinner: no fried food,  no bread, sometimes eat pasta, boiled/baked/rotisserie chicken, fish Drinks:lemon water, V8, unsweet tea, 2 cups of coffee            Social History   Substance and Sexual Activity  Alcohol Use No      Social History   Tobacco Use  Smoking Status Current Every Day Smoker  . Packs/day: 0.30  . Years: 41.00  . Pack years: 12.30  . Types: Cigarettes  Smokeless Tobacco Never Used      Health Maintenance  Topic Date Due  . Hepatitis C Screening  03/[birth year]  . PNEUMOCOCCAL POLYSACCHARIDE VACCINE (1) 08/14/1964  . FOOT EXAM  08/14/1972  . OPHTHALMOLOGY EXAM  08/14/1972  . URINE MICROALBUMIN   08/14/1972  . COLONOSCOPY  08/14/2012  . HEMOGLOBIN A1C  10/21/2017  . INFLUENZA VACCINE  01/08/2018  . TETANUS/TDAP  11/20/2025  . HIV Screening  Completed   Outpatient Encounter Medications as of 09/11/2017  Medication Sig  . albuterol (PROVENTIL HFA;VENTOLIN HFA) 108 (90 BASE) MCG/ACT inhaler Inhale 2 puffs into the lungs every 6 (six) hours as needed for wheezing or shortness of breath.  [name] aspirin EC 81 MG tablet Take 1 tablet (81 mg total) by mouth daily.  [name] atorvastatin (LIPITOR) 80 MG tablet Take 80 mg by mouth daily.  . budesonide-formoterol (SYMBICORT) 80-4.5 MCG/ACT inhaler Inhale 2 puffs as needed into the lungs.   . carvedilol (COREG) 6.25 MG tablet Take 1 tablet by mouth 2 (two) times daily.  . clopidogrel (PLAVIX) 75 MG tablet Take 1 tablet by mouth daily.  . fluticasone furoate-vilanterol (BREO ELLIPTA) 100-25 MCG/INH AEPB Inhale 1 puff into the lungs daily.  . furosemide (LASIX) 40 MG tablet Take 1 tablet (40 mg total) 2 (two) times daily by mouth.  . furosemide (LASIX) 40 MG tablet Take 1 tablet by mouth daily.  [name] gabapentin (NEURONTIN) 800 MG tablet Take 1 tablet by mouth 4 (four) times daily.  [name] glipiZIDE (GLUCOTROL) 10 MG tablet Take 10 mg by mouth daily before breakfast.  . Insulin Glargine (LANTUS SOLOSTAR) 100 UNIT/ML Solostar Pen Inject 40 Units into the skin at bedtime. 80 in am; 40 units at night  . insulin lispro (HUMALOG) 100 UNIT/ML injection Inject 0-10 Units into the skin 3 (three) times daily with meals. If BS is 0 to 70 no insulin and eat/glucose tablets/glucose get If BS 71 to 150 great! If BS 151 to 200, 2 Units Humaog If BS 201 to 250, 4 Units Humalog If BS 251 to 300, 6 Units Humalog If BS 301 to 350, 8 Units Humalog If BS 351 to 400, 10 Units Humalog If BS greater than 400, take 10 Units and Contact our office during office hour  . ipratropium-albuterol (DUONEB) 0.5-2.5 (3) MG/3ML SOLN Inhale 3 mLs into the lungs 3 (three) times daily.  [name] lisinopril  (PRINIVIL,ZESTRIL) 5 MG tablet Take 1 tablet by mouth daily.  . metFORMIN (GLUCOPHAGE-XR) 500 MG 24 hr tablet Take 500 mg by mouth daily with breakfast.  . nitroGLYCERIN (NITRODUR - DOSED IN MG/24 HR) 0.4 mg/hr patch Place 1 patch onto the skin daily. Changes patch every 12 hours  . nitroGLYCERIN (NITROSTAT) 0.4 MG SL tablet Place 0.4 mg under the tongue every 5 (five) minutes as needed for chest pain.  Derrill Memo ON 09/22/2017] oxyCODONE-acetaminophen (PERCOCET) 10-325 MG tablet Take 1 tablet by mouth every 6 (six) hours as needed.  . traZODone (DESYREL) 50 MG tablet Take 1 tablet by mouth at bedtime.  . [DISCONTINUED] carvedilol (COREG) 6.25  MG tablet Take 6.25 mg by mouth 2 (two) times daily with a meal.  . nicotine (NICODERM CQ - DOSED IN MG/24 HOURS) 14 mg/24hr patch Place 1 patch (14 mg total) daily onto the skin. (Patient not taking: Reported on 09/11/2017)  . [DISCONTINUED] atorvastatin (LIPITOR) 40 MG tablet Take 1 tablet (40 mg total) daily at 6 PM by mouth.  . [DISCONTINUED] carvedilol (COREG) 3.125 MG tablet Take 1 tablet (3.125 mg total) 2 (two) times daily with a meal by mouth.  . [DISCONTINUED] clonazePAM (KLONOPIN) 1 MG tablet Take 1 mg by mouth at bedtime.  . [DISCONTINUED] clopidogrel (PLAVIX) 75 MG tablet Take 1 tablet (75 mg total) daily by mouth.  . [DISCONTINUED] collagenase (SANTYL) ointment Apply topically daily.  . [DISCONTINUED] fluticasone furoate-vilanterol (BREO ELLIPTA) 100-25 MCG/INH AEPB Inhale 1 puff daily into the lungs.  . [DISCONTINUED] gabapentin (NEURONTIN) 800 MG tablet Take 1,600 mg by mouth 3 (three) times daily.   . [DISCONTINUED] insulin glargine (LANTUS) 100 UNIT/ML injection Inject 0.4 mLs (40 Units total) at bedtime into the skin.  . [DISCONTINUED] isosorbide mononitrate (IMDUR) 30 MG 24 hr tablet Take 1 tablet (30 mg total) by mouth daily.  . [DISCONTINUED] metFORMIN (GLUCOPHAGE) 1000 MG tablet Take 1,000 mg by mouth 2 (two) times daily with a meal.  .  [DISCONTINUED] nitroGLYCERIN (NITRODUR - DOSED IN MG/24 HR) 0.4 mg/hr patch Place 0.4 mg onto the skin daily.  . [DISCONTINUED] potassium chloride (K-DUR) 10 MEQ tablet Take 1 tablet (10 mEq total) by mouth daily. (Patient not taking: Reported on 05/09/2017)  . [DISCONTINUED] traZODone (DESYREL) 50 MG tablet Take 50 mg by mouth at bedtime.   No facility-administered encounter medications on file as of 09/11/2017.    Drug Allergies: duglutide - anaphylaxis  Assessment and Plan:  Compliance/Adherence: pt states he does not miss doses and uses an Environmental education officer for his doses. He still pick up several meds at [facility] that he can not get here. Discussed importance of compliance and adherence to health outcomes. Pt can improve on 3W's. He knows the names of his medications and when he takes them but does not know what each of them are for. He committed to work on this and will come back in 3 mos.  Smoking cessation: pt stated he is ready to quit - gave pt quitline info and stated that he would need to fill out info and fax form and that he should be eligible for patches. Explained that we do have nicotrol inhalers but that the patches might be a better fit since he has COPD. Pt accepted and is ready and wants to quit  DM  II:   Last A1c: 8.7 on 05/03/17 - discussed goal of <7.0 BG checks: how often?  Once in am, up to 3 times a day - gets strips at [facility] Ranges? Morning typically 113-197; Pt aware goal is 110 Lows: rarely - lowest is 68, knows how to handle lows uses fruit juice Suggested using Lantus at bedtime consistently and then using Humalog if needed around meals - stated that if we achieve better control that it may be possible to decrease or eliminate Humalog. On metformin 500mg  ER, Glipizide 10mg  qam, 40 units Lantus HS, and Humalog SSI.on ACE and statin  HTN: BP today 137/76  within Goal <140/90.  COPD: Duoneb,Breo ellipta, alb rescue inhaler - using up to 6x/day doesn't help  much Counseled on Breo use pt stated not using daily. Explained that using this daily would help so  that he would not have to use the rescue inhaler as much.   CHF: EF 25-30% w/grade II systolic dysfunction, on carvedilol BID, isosorbide 60 daily, furosemide 40mg  BID, lisinopril 5mg  daily. counseled on weighing daily explained if >2lbs gain daily or >5lbs/week - call MD.   F/u in 3 mos.   Netta Neat, PharmD, [facility]) [phone]

## 2017-10-22 ENCOUNTER — Telehealth: Payer: Self-pay | Admitting: Pharmacy Technician

## 2017-10-22 NOTE — Telephone Encounter (Signed)
Patient has full Medicaid.  No longer meets the eligibility on [facility] program.  Patient notified by letter.  [facility]

## 2017-11-21 ENCOUNTER — Emergency Department: Payer: Medicaid Other

## 2017-11-21 ENCOUNTER — Other Ambulatory Visit: Payer: Self-pay

## 2017-11-21 ENCOUNTER — Inpatient Hospital Stay
Admission: EM | Admit: 2017-11-21 | Discharge: 2017-11-23 | DRG: 281 | Disposition: A | Discharge status: Home / Self Care | Payer: Medicaid Other | Attending: Family Medicine | Admitting: Family Medicine

## 2017-11-21 ENCOUNTER — Encounter: Payer: Self-pay | Admitting: Emergency Medicine

## 2017-11-21 ENCOUNTER — Inpatient Hospital Stay (HOSPITAL_COMMUNITY)
Admit: 2017-11-21 | Discharge: 2017-11-21 | Disposition: A | Discharge status: Home / Self Care | Payer: Medicaid Other | Attending: Internal Medicine | Admitting: Internal Medicine

## 2017-11-21 DIAGNOSIS — E785 Hyperlipidemia, unspecified: Secondary | ICD-10-CM | POA: Diagnosis present

## 2017-11-21 DIAGNOSIS — I255 Ischemic cardiomyopathy: Secondary | ICD-10-CM | POA: Diagnosis present

## 2017-11-21 DIAGNOSIS — F17210 Nicotine dependence, cigarettes, uncomplicated: Secondary | ICD-10-CM | POA: Diagnosis present

## 2017-11-21 DIAGNOSIS — I25118 Atherosclerotic heart disease of native coronary artery with other forms of angina pectoris: Secondary | ICD-10-CM | POA: Diagnosis not present

## 2017-11-21 DIAGNOSIS — I252 Old myocardial infarction: Secondary | ICD-10-CM | POA: Diagnosis not present

## 2017-11-21 DIAGNOSIS — E11621 Type 2 diabetes mellitus with foot ulcer: Secondary | ICD-10-CM | POA: Diagnosis present

## 2017-11-21 DIAGNOSIS — L97519 Non-pressure chronic ulcer of other part of right foot with unspecified severity: Secondary | ICD-10-CM | POA: Diagnosis present

## 2017-11-21 DIAGNOSIS — Z955 Presence of coronary angioplasty implant and graft: Secondary | ICD-10-CM | POA: Diagnosis not present

## 2017-11-21 DIAGNOSIS — Z9049 Acquired absence of other specified parts of digestive tract: Secondary | ICD-10-CM

## 2017-11-21 DIAGNOSIS — G2581 Restless legs syndrome: Secondary | ICD-10-CM | POA: Diagnosis present

## 2017-11-21 DIAGNOSIS — Z8249 Family history of ischemic heart disease and other diseases of the circulatory system: Secondary | ICD-10-CM

## 2017-11-21 DIAGNOSIS — J449 Chronic obstructive pulmonary disease, unspecified: Secondary | ICD-10-CM | POA: Diagnosis present

## 2017-11-21 DIAGNOSIS — Z79899 Other long term (current) drug therapy: Secondary | ICD-10-CM

## 2017-11-21 DIAGNOSIS — Z7902 Long term (current) use of antithrombotics/antiplatelets: Secondary | ICD-10-CM | POA: Diagnosis not present

## 2017-11-21 DIAGNOSIS — E1165 Type 2 diabetes mellitus with hyperglycemia: Secondary | ICD-10-CM | POA: Diagnosis present

## 2017-11-21 DIAGNOSIS — E11610 Type 2 diabetes mellitus with diabetic neuropathic arthropathy: Secondary | ICD-10-CM | POA: Diagnosis present

## 2017-11-21 DIAGNOSIS — G4733 Obstructive sleep apnea (adult) (pediatric): Secondary | ICD-10-CM | POA: Diagnosis present

## 2017-11-21 DIAGNOSIS — I214 Non-ST elevation (NSTEMI) myocardial infarction: Secondary | ICD-10-CM | POA: Diagnosis not present

## 2017-11-21 DIAGNOSIS — I259 Chronic ischemic heart disease, unspecified: Secondary | ICD-10-CM

## 2017-11-21 DIAGNOSIS — I5022 Chronic systolic (congestive) heart failure: Secondary | ICD-10-CM | POA: Diagnosis present

## 2017-11-21 DIAGNOSIS — N400 Enlarged prostate without lower urinary tract symptoms: Secondary | ICD-10-CM | POA: Diagnosis present

## 2017-11-21 DIAGNOSIS — I2510 Atherosclerotic heart disease of native coronary artery without angina pectoris: Secondary | ICD-10-CM | POA: Diagnosis present

## 2017-11-21 DIAGNOSIS — R739 Hyperglycemia, unspecified: Secondary | ICD-10-CM

## 2017-11-21 DIAGNOSIS — I110 Hypertensive heart disease with heart failure: Secondary | ICD-10-CM | POA: Diagnosis present

## 2017-11-21 DIAGNOSIS — Z794 Long term (current) use of insulin: Secondary | ICD-10-CM | POA: Diagnosis not present

## 2017-11-21 DIAGNOSIS — R079 Chest pain, unspecified: Secondary | ICD-10-CM | POA: Diagnosis not present

## 2017-11-21 DIAGNOSIS — Z7982 Long term (current) use of aspirin: Secondary | ICD-10-CM | POA: Diagnosis not present

## 2017-11-21 DIAGNOSIS — I10 Essential (primary) hypertension: Secondary | ICD-10-CM | POA: Diagnosis present

## 2017-11-21 DIAGNOSIS — E1151 Type 2 diabetes mellitus with diabetic peripheral angiopathy without gangrene: Secondary | ICD-10-CM | POA: Diagnosis present

## 2017-11-21 DIAGNOSIS — E118 Type 2 diabetes mellitus with unspecified complications: Secondary | ICD-10-CM

## 2017-11-21 DIAGNOSIS — Z7951 Long term (current) use of inhaled steroids: Secondary | ICD-10-CM | POA: Diagnosis not present

## 2017-11-21 DIAGNOSIS — E1169 Type 2 diabetes mellitus with other specified complication: Secondary | ICD-10-CM

## 2017-11-21 DIAGNOSIS — R0789 Other chest pain: Secondary | ICD-10-CM | POA: Diagnosis present

## 2017-11-21 HISTORY — DX: Peripheral vascular disease, unspecified: I73.9

## 2017-11-21 HISTORY — DX: Non-pressure chronic ulcer of other part of right foot with unspecified severity: L97.519

## 2017-11-21 HISTORY — DX: Type 2 diabetes mellitus without complications: E11.9

## 2017-11-21 HISTORY — DX: Chronic combined systolic (congestive) and diastolic (congestive) heart failure: I50.42

## 2017-11-21 HISTORY — DX: Type 2 diabetes mellitus with diabetic neuropathy, unspecified: E11.40

## 2017-11-21 HISTORY — DX: Type 2 diabetes mellitus with foot ulcer: E11.621

## 2017-11-21 HISTORY — DX: Restless legs syndrome: G25.81

## 2017-11-21 HISTORY — DX: Disorder of arteries and arterioles, unspecified: I77.9

## 2017-11-21 HISTORY — DX: Ischemic cardiomyopathy: I25.5

## 2017-11-21 HISTORY — DX: Charcot's joint, right ankle and foot: M14.671

## 2017-11-21 HISTORY — DX: Stricture of artery: I77.1

## 2017-11-21 LAB — BASIC METABOLIC PANEL
Anion gap: 11 (ref 5–15)
BUN: 16 mg/dL (ref 6–20)
CO2: 26 mmol/L (ref 22–32)
Calcium: 9 mg/dL (ref 8.9–10.3)
Chloride: 96 mmol/L — ABNORMAL LOW (ref 101–111)
Creatinine, Ser: 1.09 mg/dL (ref 0.61–1.24)
GFR calc Af Amer: >60 mL/min (ref >60)
GFR calc non Af Amer: >60 mL/min (ref >60)
Glucose, Bld: 189 mg/dL — ABNORMAL HIGH (ref 65–99)
Potassium: 4 mmol/L (ref 3.5–5.1)
Sodium: 133 mmol/L — ABNORMAL LOW (ref 135–145)

## 2017-11-21 LAB — CBC
HCT: 37.4 % — ABNORMAL LOW (ref 40.0–52.0)
Hemoglobin: 12.9 g/dL — ABNORMAL LOW (ref 13.0–18.0)
MCH: 25.9 pg — ABNORMAL LOW (ref 26.0–34.0)
MCHC: 34.4 g/dL (ref 32.0–36.0)
MCV: 75.4 fL — ABNORMAL LOW (ref 80.0–100.0)
Platelets: 282 10*3/uL (ref 150–440)
RBC: 4.97 MIL/uL (ref 4.40–5.90)
RDW: 23.6 % — ABNORMAL HIGH (ref 11.5–14.5)
WBC: 10.8 10*3/uL — ABNORMAL HIGH (ref 3.8–10.6)

## 2017-11-21 LAB — URINE DRUG SCREEN, QUALITATIVE (ARMC ONLY)
Amphetamines, Ur Screen: NOT DETECTED
Benzodiazepine, Ur Scrn: NOT DETECTED
Cannabinoid 50 Ng, Ur ~~LOC~~: NOT DETECTED
Cocaine Metabolite,Ur ~~LOC~~: NOT DETECTED
MDMA (Ecstasy)Ur Screen: NOT DETECTED
Methadone Scn, Ur: NOT DETECTED
Opiate, Ur Screen: POSITIVE — AB
Phencyclidine (PCP) Ur S: NOT DETECTED
Tricyclic, Ur Screen: NOT DETECTED

## 2017-11-21 LAB — GLUCOSE, CAPILLARY
Glucose-Capillary: 202 mg/dL — ABNORMAL HIGH (ref 65–99)
Glucose-Capillary: 178 mg/dL — ABNORMAL HIGH (ref 65–99)
Glucose-Capillary: 153 mg/dL — ABNORMAL HIGH (ref 65–99)
Glucose-Capillary: 265 mg/dL — ABNORMAL HIGH (ref 65–99)
Glucose-Capillary: 171 mg/dL — ABNORMAL HIGH (ref 65–99)

## 2017-11-21 LAB — HEPATIC FUNCTION PANEL
ALT: 12 U/L — ABNORMAL LOW (ref 17–63)
AST: 23 U/L (ref 15–41)
Albumin: 3.4 g/dL — ABNORMAL LOW (ref 3.5–5.0)
Alkaline Phosphatase: 93 U/L (ref 38–126)
Bilirubin, Direct: 0.1 mg/dL (ref 0.1–0.5)
Indirect Bilirubin: 0.6 mg/dL (ref 0.3–0.9)
Total Bilirubin: 0.7 mg/dL (ref 0.3–1.2)
Total Protein: 7.1 g/dL (ref 6.5–8.1)

## 2017-11-21 LAB — ECHOCARDIOGRAM COMPLETE
Height: 67 in
Weight: 3707.26 oz

## 2017-11-21 LAB — CK: Total CK: 202 U/L (ref 49–397)

## 2017-11-21 LAB — LIPASE, BLOOD: Lipase: 33 U/L (ref 11–51)

## 2017-11-21 LAB — TROPONIN I
Troponin I: 0.1 ng/mL (ref <0.03)
Troponin I: 0.92 ng/mL (ref <0.03)
Troponin I: 1.95 ng/mL (ref <0.03)
Troponin I: 2.32 ng/mL (ref <0.03)

## 2017-11-21 LAB — HEPARIN LEVEL (UNFRACTIONATED)
Heparin Unfractionated: 0.13 IU/mL — ABNORMAL LOW (ref 0.30–0.70)
Heparin Unfractionated: 0.27 IU/mL — ABNORMAL LOW (ref 0.30–0.70)

## 2017-11-21 LAB — APTT: aPTT: 34 seconds (ref 24–36)

## 2017-11-21 LAB — BRAIN NATRIURETIC PEPTIDE: B Natriuretic Peptide: 206 pg/mL — ABNORMAL HIGH (ref 0.0–100.0)

## 2017-11-21 LAB — PROTIME-INR
INR: 1.06
Prothrombin Time: 13.7 seconds (ref 11.4–15.2)

## 2017-11-21 LAB — MRSA PCR SCREENING: MRSA by PCR: NEGATIVE

## 2017-11-21 LAB — HEMOGLOBIN A1C
Hgb A1c MFr Bld: 8.5 % — ABNORMAL HIGH (ref 4.8–5.6)
Mean Plasma Glucose: 197.25 mg/dL

## 2017-11-21 LAB — MAGNESIUM: Magnesium: 1.7 mg/dL (ref 1.7–2.4)

## 2017-11-21 MED ORDER — TICAGRELOR 90 MG PO TABS
180.0000 mg | ORAL_TABLET | Freq: Once | ORAL | Status: AC
  Administered 2017-11-21: 180 mg via ORAL
  Filled 2017-11-21: qty 2

## 2017-11-21 MED ORDER — ACETAMINOPHEN 650 MG RE SUPP: 650.0000 mg | Freq: Four times a day (QID) | RECTAL | Status: DC | PRN

## 2017-11-21 MED ORDER — GABAPENTIN 400 MG PO CAPS
800.0000 mg | ORAL_CAPSULE | Freq: Three times a day (TID) | ORAL | Status: DC
  Administered 2017-11-21 – 2017-11-23 (×6): 800 mg via ORAL
  Filled 2017-11-21 (×6): qty 2

## 2017-11-21 MED ORDER — HEPARIN SODIUM (PORCINE) 5000 UNIT/ML IJ SOLN
4000.0000 [IU] | Freq: Once | INTRAMUSCULAR | Status: AC
  Administered 2017-11-21: 4000 [IU] via INTRAVENOUS
  Filled 2017-11-21: qty 1

## 2017-11-21 MED ORDER — ATORVASTATIN CALCIUM 20 MG PO TABS
80.0000 mg | ORAL_TABLET | Freq: Every day | ORAL | Status: DC
  Administered 2017-11-21 – 2017-11-23 (×3): 80 mg via ORAL
  Filled 2017-11-21 (×3): qty 4

## 2017-11-21 MED ORDER — ONDANSETRON HCL 4 MG/2ML IJ SOLN
4.0000 mg | Freq: Four times a day (QID) | INTRAMUSCULAR | Status: DC | PRN
  Administered 2017-11-21 – 2017-11-22 (×4): 4 mg via INTRAVENOUS
  Filled 2017-11-21 (×4): qty 2

## 2017-11-21 MED ORDER — INSULIN ASPART 100 UNIT/ML ~~LOC~~ SOLN
0.0000 [IU] | Freq: Four times a day (QID) | SUBCUTANEOUS | Status: DC
  Administered 2017-11-21: 2 [IU] via SUBCUTANEOUS
  Administered 2017-11-21: 5 [IU] via SUBCUTANEOUS
  Filled 2017-11-21 (×2): qty 1

## 2017-11-21 MED ORDER — IPRATROPIUM-ALBUTEROL 0.5-2.5 (3) MG/3ML IN SOLN: 3.0000 mL | Freq: Four times a day (QID) | RESPIRATORY_TRACT | Status: DC | PRN

## 2017-11-21 MED ORDER — ONDANSETRON HCL 4 MG PO TABS: 4.0000 mg | ORAL_TABLET | Freq: Four times a day (QID) | ORAL | Status: DC | PRN

## 2017-11-21 MED ORDER — CARVEDILOL 6.25 MG PO TABS
6.2500 mg | ORAL_TABLET | Freq: Two times a day (BID) | ORAL | Status: DC
Start: 2017-11-21 — End: 2017-11-23
  Administered 2017-11-21 – 2017-11-23 (×5): 6.25 mg via ORAL
  Filled 2017-11-21 (×5): qty 1

## 2017-11-21 MED ORDER — MAGNESIUM SULFATE 2 GM/50ML IV SOLN
2.0000 g | Freq: Once | INTRAVENOUS | Status: AC
  Administered 2017-11-21: 2 g via INTRAVENOUS
  Filled 2017-11-21: qty 50

## 2017-11-21 MED ORDER — HEPARIN BOLUS VIA INFUSION
2600.0000 [IU] | Freq: Once | INTRAVENOUS | Status: AC
  Administered 2017-11-21: 2600 [IU] via INTRAVENOUS
  Filled 2017-11-21: qty 2600

## 2017-11-21 MED ORDER — ACETAMINOPHEN 325 MG PO TABS: 650.0000 mg | ORAL_TABLET | Freq: Four times a day (QID) | ORAL | Status: DC | PRN

## 2017-11-21 MED ORDER — ENOXAPARIN SODIUM 40 MG/0.4ML ~~LOC~~ SOLN: 40.0000 mg | SUBCUTANEOUS | Status: DC

## 2017-11-21 MED ORDER — MORPHINE SULFATE (PF) 2 MG/ML IV SOLN
1.0000 mg | INTRAVENOUS | Status: DC | PRN
  Administered 2017-11-21: 2 mg via INTRAVENOUS
  Administered 2017-11-21: 1 mg via INTRAVENOUS
  Administered 2017-11-21 – 2017-11-23 (×6): 2 mg via INTRAVENOUS
  Filled 2017-11-21 (×8): qty 1

## 2017-11-21 MED ORDER — SODIUM CHLORIDE 0.9 % IV BOLUS
500.0000 mL | INTRAVENOUS | Status: AC
  Administered 2017-11-21: 500 mL via INTRAVENOUS

## 2017-11-21 MED ORDER — PERFLUTREN LIPID MICROSPHERE
1.0000 mL | INTRAVENOUS | Status: AC | PRN
  Administered 2017-11-21: 3 mL via INTRAVENOUS
  Filled 2017-11-21: qty 10

## 2017-11-21 MED ORDER — CLOPIDOGREL BISULFATE 75 MG PO TABS
75.0000 mg | ORAL_TABLET | Freq: Every day | ORAL | Status: DC
  Administered 2017-11-22 – 2017-11-23 (×2): 75 mg via ORAL
  Filled 2017-11-21 (×2): qty 1

## 2017-11-21 MED ORDER — INSULIN ASPART 100 UNIT/ML ~~LOC~~ SOLN
0.0000 [IU] | Freq: Three times a day (TID) | SUBCUTANEOUS | Status: DC
  Administered 2017-11-22 (×2): 3 [IU] via SUBCUTANEOUS
  Administered 2017-11-22: 5 [IU] via SUBCUTANEOUS
  Administered 2017-11-22 – 2017-11-23 (×2): 3 [IU] via SUBCUTANEOUS
  Filled 2017-11-21 (×5): qty 1

## 2017-11-21 MED ORDER — TRAZODONE HCL 50 MG PO TABS: 50.0000 mg | ORAL_TABLET | Freq: Every evening | ORAL | Status: DC | PRN

## 2017-11-21 MED ORDER — HEPARIN (PORCINE) IN NACL 100-0.45 UNIT/ML-% IJ SOLN
1700.0000 [IU]/h | INTRAMUSCULAR | Status: DC
  Administered 2017-11-21: 1200 [IU]/h via INTRAVENOUS
  Filled 2017-11-21 (×2): qty 250

## 2017-11-21 MED ORDER — ASPIRIN EC 81 MG PO TBEC: 81.0000 mg | DELAYED_RELEASE_TABLET | Freq: Every day | ORAL | Status: DC

## 2017-11-21 MED ORDER — ASPIRIN 81 MG PO CHEW
324.0000 mg | CHEWABLE_TABLET | Freq: Once | ORAL | Status: AC
  Administered 2017-11-21: 324 mg via ORAL
  Filled 2017-11-21: qty 4

## 2017-11-21 MED ORDER — ASPIRIN EC 81 MG PO TBEC
81.0000 mg | DELAYED_RELEASE_TABLET | Freq: Every day | ORAL | Status: DC
  Administered 2017-11-22: 81 mg via ORAL
  Filled 2017-11-21 (×2): qty 1

## 2017-11-21 MED ORDER — HEPARIN BOLUS VIA INFUSION
1300.0000 [IU] | Freq: Once | INTRAVENOUS | Status: AC
  Administered 2017-11-21: 1300 [IU] via INTRAVENOUS
  Filled 2017-11-21: qty 1300

## 2017-11-21 NOTE — ED Notes (Signed)
Lab reports troponin 0.10; charge nurse notified

## 2017-11-21 NOTE — Progress Notes (Signed)
ANTICOAGULATION CONSULT NOTE - Initial Consult  Pharmacy Consult for heparin drip Indication: chest pain/ACS  Allergies  Allergen Reactions  . Dulaglutide Anaphylaxis, Diarrhea and Hives    Patient Measurements: Height: 5\' 7"  (170.2 cm) Weight: 229 lb 14.4 oz (104.3 kg) IBW/kg (Calculated) : 66.1 Heparin Dosing Weight: 90 kg  Vital Signs: Temp: 98.5 F (36.9 C) (06/14 1307) Temp Source: Oral (06/14 1307) BP: 131/74 (06/14 1307) Pulse Rate: 94 (06/14 1307)  Labs: Recent Labs    11/21/17 0115 11/21/17 0251 11/21/17 0812 11/21/17 1053 11/21/17 1249 11/21/17 1908  [facility]GB 12.9*  --   --   --   --   --   HCT 37.4*  --   --   --   --   --   PLT 282  --   --   --   --   --   APTT  --  34  --   --   --   --   LABPROT  --  13.7  --   --   --   --   INR  --  1.06  --   --   --   --   HEPARINUNFRC  --   --   --  0.13*  --  0.27*  CREATININE 1.09  --   --   --   --   --   CKTOTAL  --  202  --   --   --   --   TROPONINI 0.10*  --  0.92*  --  1.95*  --     Estimated Creatinine Clearance: 88.2 mL/min (by C-G formula based on SCr of 1.09 mg/dL).   Medical History: Past Medical History:  Diagnosis Date  . Arthritis   . Asthma   . Atherosclerosis   . BPH (benign prostatic hyperplasia)   . Carotid arterial disease ([place])   . Charcot's joint of foot, right   . Chronic combined systolic (congestive) and diastolic (congestive) heart failure ([place])    a. 02/2013 EF 50% by LV gram; b. 04/2017 Echo: EF 25-30%. diff HK. Gr2 DD. Mod dil LA/RV. PASP 33mmHg.  [name] COPD (chronic obstructive pulmonary disease) ([place])   . Coronary artery disease    a. 2013 S/P PCI of LAD [facility]);  b. 03/2013 PCI: RCA 90p ( 2.5 x 23 mm DES); c. 02/2014 Cath: patent RCA stent->Med Rx; d. 04/2017 Cath: LM nl, LAd 30ost/p, 4m/d, D1 80ost, LCX 90p/m, OM1 85, RCA 70/20p, 55m, 70d->Referred for CT Surg-felt to be poor candidate.  . Diabetic neuropathy ([place])   . Diabetic ulcer of right foot ([place])   . Hernia   .  Hyperlipidemia   . Hypertension   . Ischemic cardiomyopathy    a. 04/2017 Echo: EF 25-30%; b. 08/2017 Cardiac MRI ([facility]): EF 19%, sev glob HK. RVEF 20%, mod BAE, triv MR, mild-mod TR. Basal lateral subendocardial infarct (viable), inf/infsept, dist septal ischemia, basal to mid lat peri-infarct ischemia.  . Morbid obesity ([place])   . Neuropathy   . PAD (peripheral artery disease) ([place])    a. Followed by Dr. Lucky Cowboy; b. 08/2010 Periph Angio: RSFA 70-80p (6X60 self-expanding stent); c. 09/2010 Periph Angio: L SFA 100p (7x unknown length self-expanding stent); d. 06/2015 Periph Angio: R SFA short segment occlusion (6x12 self-expanding stent).  . Restless leg syndrome   . Sleep apnea   . Subclavian artery stenosis, right ([place])   . Syncope and collapse   . Tobacco use    a. 75+  yr hx - still smoking 1ppd, down from 2 ppd.  . Type II diabetes mellitus ([place])   . Varicose vein     Assessment: 57 y/o F with a h/o CAD and CHF admitted for NSTEMI.   Goal of Therapy:  Heparin level 0.3-0.7 units/ml Monitor platelets by anticoagulation protocol: Yes   Plan:  Heparin level is below goal at 0.27 IU/mL Will bolus heparin 1300 units and inrease infusion to 1700 unit/hr. Will recheck a HL in 6 hours.   Dallie Piles, PharmD 11/21/2017,7:45 PM

## 2017-11-21 NOTE — Progress Notes (Signed)
NSTEMI (non-ST elevated myocardial infarction) ([place]) -start IV heparin, trend cardiac enzymes, give 1 dose of Brilinta as well, cardiology consult, admit to stepdown unit with echocardiogram in the morning Active Problems:   Coronary artery disease -continue home meds, other work-up as above   Hypertension -hold antihypertensives for now as the patient's blood pressure is borderline low.   Chronic systolic CHF (congestive heart failure) ([facility]) -continue home meds   Diabetes ([place]) -sliding scale insulin with corresponding glucose checks   Hyperlipidemia -home dose statin  Agree with above

## 2017-11-21 NOTE — Progress Notes (Signed)
*  PRELIMINARY RESULTS* Echocardiogram 2D Echocardiogram has been performed.  [name] [name] 11/21/2017, 10:37 AM

## 2017-11-21 NOTE — Consult Note (Signed)
Name: [name] [name] MRN: [id number] DOB: 03-Jul-1960    ADMISSION DATE:  11/21/2017 CONSULTATION DATE: 11/21/2017  REFERRING MD : Dr. Jannifer Franklin   CHIEF COMPLAINT: Chest Pain   BRIEF PATIENT DESCRIPTION:  57 yo male with CAD with previous stenting admitted with NSTEMI   SIGNIFICANT EVENTS/STUDIES:  06/14 Pt admitted to stepdown unit   HISTORY OF PRESENT ILLNESS:   This is a 57 yo male with a PMH of Current Everyday Smoker, OSA (does not wear CPAP), PVD, PAD, Neuropathy, Morbid Obesity, HTN, Hyperlipidemia, Diabetes Mellitus, COPD, CAD with previous stenting, Chronic Diastolic CHF, BPH, Atherosclerosis, Asthma, and Arthritis.  He presented to [facility] ER on 06/13 with centrally located chest tightness radiating to the neck and shortness of breath worse with exertion.  According to the pt his symptoms started around 2230 on 06/13, therefore he took a sublingual nitroglycerin x1 dose and his Proair inhaler with slight relief of symptoms.  He states he thinks he "overdid it yesterday," he has been doing extensive work around his home and lifting heavy objects. In the ER lab results revealed troponin 0.10, BNP 206, and initial EKG revealed lateral ST/T wave changes concerning for acute ischemia, however did not meet STEMI criteria.  The pt is currently being seen by [facility] for CAD management and possible high risk surgical revascularization.  During current ER visit he was subsequently admitted to the stepdown unit by hospitalist team for further workup and treatment.  PAST MEDICAL HISTORY :   has a past medical history of Arthritis, Asthma, Atherosclerosis, BPH (benign prostatic hyperplasia), Chronic diastolic CHF (congestive heart failure) ([place]), COPD (chronic obstructive pulmonary disease) ([place]), Coronary artery disease, Diabetes mellitus without complication ([place]), Hernia, Hyperlipidemia, Hypertension, Morbid obesity ([place]), Neuropathy, PAD (peripheral artery disease) ([place]), Peripheral vascular  disease ([place]), Sleep apnea, Syncope and collapse, Tobacco use, and Varicose vein.  has a past surgical history that includes Appendectomy; Peripheral arterial stent graft; Clavicle surgery; Hernia repair; Cardiac catheterization (10/14); Cardiac catheterization (02/2010); Cardiac catheterization (02/21/2014); abdominal aortagram (N/A, 06/23/2013); Cardiac catheterization (N/A, 07/06/2015); Cardiac catheterization (07/06/2015); and LEFT HEART CATH AND CORS/GRAFTS ANGIOGRAPHY (N/A, 04/28/2017). Prior to Admission medications   Medication Sig Start Date End Date Taking? Authorizing Provider  albuterol (PROVENTIL HFA;VENTOLIN HFA) 108 (90 BASE) MCG/ACT inhaler Inhale 2 puffs into the lungs every 6 (six) hours as needed for wheezing or shortness of breath. 04/15/13  Yes Wellington Hampshire, MD  aspirin EC 81 MG tablet Take 1 tablet (81 mg total) by mouth daily. 05/11/15  Yes Wellington Hampshire, MD  atorvastatin (LIPITOR) 80 MG tablet Take 80 mg by mouth daily.   Yes [provider]  carvedilol (COREG) 6.25 MG tablet Take 1 tablet by mouth 2 (two) times daily. 09/10/17 09/10/18 Yes [provider]  clopidogrel (PLAVIX) 75 MG tablet Take 1 tablet by mouth daily. 08/22/17  Yes [provider]  furosemide (LASIX) 40 MG tablet Take 1 tablet (40 mg total) 2 (two) times daily by mouth. Patient taking differently: Take 40 mg by mouth daily as needed for fluid.  04/29/17 04/29/18 Yes Max Sane, MD  gabapentin (NEURONTIN) 800 MG tablet Take 1 tablet by mouth 4 (four) times daily. 08/14/17 08/14/18 Yes [provider]  Insulin Glargine (LANTUS SOLOSTAR) 100 UNIT/ML Solostar Pen Inject 40-80 Units into the skin 2 (two) times daily as needed. 80 in am; 40 units at night 01/21/17  Yes [provider]  insulin lispro (HUMALOG) 100 UNIT/ML injection Inject 0-10 Units into the skin 3 (  three) times daily with meals. If BS is 0 to 70 no insulin and eat/glucose tablets/glucose get If BS 71 to 150  great! If BS 151 to 200, 2 Units Humaog If BS 201 to 250, 4 Units Humalog If BS 251 to 300, 6 Units Humalog If BS 301 to 350, 8 Units Humalog If BS 351 to 400, 10 Units Humalog If BS greater than 400, take 10 Units and Contact our office during office hour   Yes [provider]  ipratropium-albuterol (DUONEB) 0.5-2.5 (3) MG/3ML SOLN Inhale 3 mLs into the lungs 3 (three) times daily.   Yes [provider]  lisinopril (PRINIVIL,ZESTRIL) 5 MG tablet Take 1 tablet by mouth daily. 09/10/17 09/10/18 Yes [provider]  metFORMIN (GLUCOPHAGE-XR) 500 MG 24 hr tablet Take 500 mg by mouth daily with breakfast.   Yes [provider]  nitroGLYCERIN (NITRODUR - DOSED IN MG/24 HR) 0.4 mg/hr patch Place 1 patch onto the skin daily. Changes patch every 12 hours 08/14/17  Yes [provider]  nitroGLYCERIN (NITROSTAT) 0.4 MG SL tablet Place 0.4 mg under the tongue every 5 (five) minutes as needed for chest pain.   Yes [provider]  traZODone (DESYREL) 50 MG tablet Take 1 tablet by mouth at bedtime. 08/12/17  Yes [provider]  nicotine (NICODERM CQ - DOSED IN MG/24 HOURS) 14 mg/24hr patch Place 1 patch (14 mg total) daily onto the skin. Patient not taking: Reported on 09/11/2017 04/29/17   Max Sane, MD   Allergies  Allergen Reactions  . Dulaglutide Anaphylaxis, Diarrhea and Hives    FAMILY HISTORY:  family history includes Heart attack in his father; Heart disease in his father; Hyperlipidemia in his father and mother; Hypertension in his father and mother. SOCIAL HISTORY:  reports that he has been smoking cigarettes.  He has a 12.30 pack-year smoking history. He has never used smokeless tobacco. He reports that he does not drink alcohol or use drugs.  REVIEW OF SYSTEMS: Positives in BOLD  Constitutional: Negative for fever, chills, weight loss, malaise/fatigue and diaphoresis.  HENT: Negative for hearing loss, ear pain, nosebleeds, congestion,  sore throat, neck pain, tinnitus and ear discharge.   Eyes: Negative for blurred vision, double vision, photophobia, pain, discharge and redness.  Respiratory: cough, hemoptysis, sputum production, shortness of breath, wheezing and stridor.   Cardiovascular: chest tightness with radiation to the neck, palpitations, orthopnea, claudication, leg swelling and PND.  Gastrointestinal: Negative for heartburn, nausea, vomiting, abdominal pain, diarrhea, constipation, blood in stool and melena.  Genitourinary: Negative for dysuria, urgency, frequency, hematuria and flank pain.  Musculoskeletal: Negative for myalgias, back pain, joint pain and falls.  Skin: Negative for itching and rash.  Neurological: Negative for dizziness, tingling, tremors, sensory change, speech change, focal weakness, seizures, loss of consciousness, weakness and headaches.  Endo/Heme/Allergies: Negative for environmental allergies and polydipsia. Does not bruise/bleed easily.  SUBJECTIVE:  No complaints pt states chest pain and shortness of breath have resolved   VITAL SIGNS: Temp:  [97.9 F (36.6 C)-98.5 F (36.9 C)] 97.9 F (36.6 C) (06/14 0415) Pulse Rate:  [106-123] 123 (06/14 0415) Resp:  [18-28] 24 (06/14 0415) BP: (84-134)/(53-80) 95/80 (06/14 0415) SpO2:  [90 %-100 %] 94 % (06/14 0415) Weight:  [106.6 kg (235 lb)] 106.6 kg (235 lb) (06/14 0110)  PHYSICAL EXAMINATION: General: well developed, well nourished male, NAD  Neuro: alert and oriented, follows commands  HEENT: supple, no JVD Cardiovascular: sinus tach, no R/G Lungs: faint wheeze throughout, even, non  labored  Abdomen: +BS x4, obese, soft, non tender, non distended  Musculoskeletal: moves all extremities, 1+ RLE edema  Skin: right foot diabetic ulceration dressing dry and intact   Recent Labs  Lab 11/21/17 0115  NA 133*  K 4.0  CL 96*  CO2 26  BUN 16  CREATININE 1.09  GLUCOSE 189*   Recent Labs  Lab 11/21/17 0115  HGB 12.9*  HCT 37.4*    WBC 10.8*  PLT 282   Dg Chest 2 View  Result Date: 11/21/2017 CLINICAL DATA:  Chest pain and shortness of breath. EXAM: CHEST - 2 VIEW COMPARISON:  Most recent radiographs and CT 04/22/2017 FINDINGS: Borderline cardiomegaly, similar to prior exam. There is atherosclerosis of the aortic arch. Central peribronchial thickening without evidence of superimposed edema. No focal airspace disease, pleural effusion or pneumothorax. Chronic deformity of the distal right clavicle. IMPRESSION: Borderline cardiomegaly. Chronic central bronchial thickening. Resolved pulmonary edema and pleural effusions from prior exam Electronically Signed   By: Jeb Levering M.D.   On: 11/21/2017 01:24    ASSESSMENT / PLAN: NSTEMI  Diabetes Mellitus  Hx: CAD with stenting, Current everyday smoker, PVD, OSA, Chronic diastolic CHF, and HTN P: Supplemental O2 for dyspnea and/or hypoxia  Prn bronchodilator therapy  Continuous telemetry monitoring  Trend troponin's Echo pending  Continue heparin gtt  Trend CBC  Monitor for s/sx of bleeding and transfuse for hgb <8 Cardiology consulted appreciate input  Trend BMP  Replace electrolytes as indicated  Monitor UOP Hemoglobin A1c pending  SSI  Prn morphine for pain management  Smoking cessation counseling provided  Marda Stalker, [place] Pager [phone] (please enter 7 digits) PCCM Consult Pager [phone] (please enter 7 digits)

## 2017-11-21 NOTE — Progress Notes (Signed)
ANTICOAGULATION CONSULT NOTE - Initial Consult  Pharmacy Consult for heparin drip Indication: chest pain/ACS  Allergies  Allergen Reactions  . Dulaglutide Anaphylaxis, Diarrhea and Hives    Patient Measurements: Height: 5\' 7"  (170.2 cm) Weight: 235 lb (106.6 kg) IBW/kg (Calculated) : 66.1 Heparin Dosing Weight: 90 kg  Vital Signs: Temp: 98.5 F (36.9 C) (06/14 0110) Temp Source: Oral (06/14 0110) BP: 133/75 (06/14 0330) Pulse Rate: 106 (06/14 0330)  Labs: Recent Labs    11/21/17 0115 11/21/17 0251  HGB 12.9*  --   HCT 37.4*  --   PLT 282  --   APTT  --  34  LABPROT  --  13.7  INR  --  1.06  CREATININE 1.09  --   TROPONINI 0.10*  --     Estimated Creatinine Clearance: 89.1 mL/min (by C-G formula based on SCr of 1.09 mg/dL).   Medical History: Past Medical History:  Diagnosis Date  . Arthritis   . Asthma   . Atherosclerosis   . BPH (benign prostatic hyperplasia)   . Chronic diastolic CHF (congestive heart failure) ([place])    a. 02/2013 EF 50% by LV gram.  . COPD (chronic obstructive pulmonary disease) ([place])   . Coronary artery disease    a. Status post LAD PCI in 2013 at [facility];  b. October 2014 - Nuclear stress test showed inferior wall defect->Cath: patent LAD stent with 90% stenosis in the proximal RCA ( 2.5 x 23 mm DES), EF 50%;  c. 02/2014 MV w/ large inferior scar->Cath: LAD 31m, LCX 2m, 20d, RCA 20p/m, patent stent->Med Rx.  . Diabetes mellitus without complication ([place])   . Hernia   . Hyperlipidemia   . Hypertension   . Morbid obesity ([place])   . Neuropathy   . PAD (peripheral artery disease) ([place])    a. Angiography in March of 2012 showed 70-80% proximal right SFA stenosis status post self-expanding stent placement (6X60 mm). Angiography in April 2012 showed proximal left SFA occlusion status post self-expanding stent placement (7 X unkonw length). Both done by Dr. Lucky Cowboy.  . Peripheral vascular disease ([place])   . Sleep apnea   . Syncope and collapse   .  Tobacco use    a. 75+ yr hx - still smoking 1ppd, down from 2 ppd.  . Varicose vein     Medications:  No anticoagulation in PTA meds  Assessment: Trop 0.1   Goal of Therapy:  Heparin level 0.3-0.7 units/ml Monitor platelets by anticoagulation protocol: Yes   Plan:  4000 unit bolus in ED. 1200 units/hr as continuation. First heparin level 6 hours after start of infusion.  Lira Stephen S 11/21/2017,3:57 AM

## 2017-11-21 NOTE — Progress Notes (Signed)
A&O patient admitted to [facility] with NSTEMI. Pt is ST on the monitor, c/o chest pain 5-6/10 dull and radiating to BL neck. Pt's pain resolved and was 0/10 after pt admitted and settled in bed. Pt did c/o nausea, improved with Zofran. Brilinta given per order.Heparin bolus given in ED and heparin gtt started after arrival onto unit. Pt voids using urinal, UDS sent per order. Pt noted with dressing to right foot and states that [facility] has been caring for the wound and dressing changes.

## 2017-11-21 NOTE — ED Provider Notes (Signed)
[facility] Emergency Department Provider Note  ____________________________________________   First MD Initiated Contact with Patient 11/21/17 [phone]     (approximate)  I have reviewed the triage vital signs and the nursing notes.   HISTORY  Chief Complaint Chest Pain    HPI [name] [name] is a 57 y.o. male with an extensive vascular history and known multivessel coronary artery disease who presents by private vehicle for evaluation of acute onset severe chest pain earlier today.  He states that he was at work and after working outside extensively he suddenly became acutely short of breath, diaphoretic, and had severe sharp pain in the middle of his chest rating up into both sides of his neck.  He almost passed out because of it.  After a period of rest he started to feel better but he had persistent pain.  He took a nitroglycerin which did make it feel better but with any amount of exertion the pain returns.  He has a history of stents in his legs, is not sure about coronary stents, but did have a cardiac catheterization about 7 months ago that showed multiple vessels with stenosis but for which he did not get any coronary stents.  He denies fever/chills, nausea, vomiting, and abdominal pain.  He states that he is used to having some chest pain but this was very different from normal and is now worse with any amount of exertion.  Past Medical History:  Diagnosis Date  . Arthritis   . Asthma   . Atherosclerosis   . BPH (benign prostatic hyperplasia)   . Chronic diastolic CHF (congestive heart failure) ([place])    a. 02/2013 EF 50% by LV gram.  . COPD (chronic obstructive pulmonary disease) ([place])   . Coronary artery disease    a. Status post LAD PCI in 2013 at [facility];  b. October 2014 - Nuclear stress test showed inferior wall defect->Cath: patent LAD stent with 90% stenosis in the proximal RCA ( 2.5 x 23 mm DES), EF 50%;  c. 02/2014 MV w/ large inferior scar->Cath:  LAD 42m, LCX 26m, 20d, RCA 20p/m, patent stent->Med Rx.  . Diabetes mellitus without complication ([place])   . Hernia   . Hyperlipidemia   . Hypertension   . Morbid obesity ([place])   . Neuropathy   . PAD (peripheral artery disease) ([place])    a. Angiography in March of 2012 showed 70-80% proximal right SFA stenosis status post self-expanding stent placement (6X60 mm). Angiography in April 2012 showed proximal left SFA occlusion status post self-expanding stent placement (7 X unkonw length). Both done by Dr. Lucky Cowboy.  . Peripheral vascular disease ([place])   . Sleep apnea   . Syncope and collapse   . Tobacco use    a. 75+ yr hx - still smoking 1ppd, down from 2 ppd.  . Varicose vein     Patient Active Problem List   Diagnosis Date Noted  . NSTEMI (non-ST elevated myocardial infarction) ([place]) 11/21/2017  . Diabetes ([place]) 05/02/2017  . Lymphedema 05/02/2017  . Chronic systolic CHF (congestive heart failure) ([place]) 04/23/2017  . Chest pain 07/11/2016  . Unstable angina ([place]) 07/11/2016  . Ulcer of right foot ([place])   . Stable angina ([facility])   . Pressure injury of skin 05/06/2016  . Diabetic foot infection ([place]) 05/05/2016  . Cellulitis 11/21/2015  . Morbid obesity ([place]) 11/17/2014  . PAD (peripheral artery disease) ([place])   . Coronary artery disease   . Hyperlipidemia   .  Hypertension   . Tobacco use     Past Surgical History:  Procedure Laterality Date  . ABDOMINAL AORTAGRAM N/A 06/23/2013   Procedure: ABDOMINAL Maxcine Ham;  Surgeon: Wellington Hampshire, MD;  Location: [place] CATH LAB;  Service: Cardiovascular;  Laterality: N/A;  . APPENDECTOMY    . CARDIAC CATHETERIZATION  10/14   [facility]; X1 STENT PROXIMAL RCA  . CARDIAC CATHETERIZATION  02/2010   [facility]  . CARDIAC CATHETERIZATION  02/21/2014   [facility]  . CLAVICLE SURGERY    . HERNIA REPAIR    . LEFT HEART CATH AND CORS/GRAFTS ANGIOGRAPHY N/A 04/28/2017   Procedure: LEFT HEART CATH AND CORONARY ANGIOGRAPHY;  Surgeon: Wellington Hampshire, MD;  Location:  [place] CV LAB;  Service: Cardiovascular;  Laterality: N/A;  . PERIPHERAL ARTERIAL STENT GRAFT     x2 left/right  . PERIPHERAL VASCULAR CATHETERIZATION N/A 07/06/2015   Procedure: Abdominal Aortogram w/Lower Extremity;  Surgeon: Algernon Huxley, MD;  Location: [place] CV LAB;  Service: Cardiovascular;  Laterality: N/A;  . PERIPHERAL VASCULAR CATHETERIZATION  07/06/2015   Procedure: Lower Extremity Intervention;  Surgeon: Algernon Huxley, MD;  Location: [place] CV LAB;  Service: Cardiovascular;;    Prior to Admission medications   Medication Sig Start Date End Date Taking? Authorizing Provider  albuterol (PROVENTIL HFA;VENTOLIN HFA) 108 (90 BASE) MCG/ACT inhaler Inhale 2 puffs into the lungs every 6 (six) hours as needed for wheezing or shortness of breath. 04/15/13  Yes Wellington Hampshire, MD  aspirin EC 81 MG tablet Take 1 tablet (81 mg total) by mouth daily. 05/11/15  Yes Wellington Hampshire, MD  atorvastatin (LIPITOR) 80 MG tablet Take 80 mg by mouth daily.   Yes [provider]  carvedilol (COREG) 6.25 MG tablet Take 1 tablet by mouth 2 (two) times daily. 09/10/17 09/10/18 Yes [provider]  clopidogrel (PLAVIX) 75 MG tablet Take 1 tablet by mouth daily. 08/22/17  Yes [provider]  furosemide (LASIX) 40 MG tablet Take 1 tablet (40 mg total) 2 (two) times daily by mouth. Patient taking differently: Take 40 mg by mouth daily as needed for fluid.  04/29/17 04/29/18 Yes Max Sane, MD  gabapentin (NEURONTIN) 800 MG tablet Take 1 tablet by mouth 4 (four) times daily. 08/14/17 08/14/18 Yes [provider]  Insulin Glargine (LANTUS SOLOSTAR) 100 UNIT/ML Solostar Pen Inject 40-80 Units into the skin 2 (two) times daily as needed. 80 in am; 40 units at night 01/21/17  Yes [provider]  insulin lispro (HUMALOG) 100 UNIT/ML injection Inject 0-10 Units into the skin 3 (three) times daily with meals. If BS is 0 to 70 no insulin and eat/glucose tablets/glucose  get If BS 71 to 150 great! If BS 151 to 200, 2 Units Humaog If BS 201 to 250, 4 Units Humalog If BS 251 to 300, 6 Units Humalog If BS 301 to 350, 8 Units Humalog If BS 351 to 400, 10 Units Humalog If BS greater than 400, take 10 Units and Contact our office during office hour   Yes [provider]  ipratropium-albuterol (DUONEB) 0.5-2.5 (3) MG/3ML SOLN Inhale 3 mLs into the lungs 3 (three) times daily.   Yes [provider]  lisinopril (PRINIVIL,ZESTRIL) 5 MG tablet Take 1 tablet by mouth daily. 09/10/17 09/10/18 Yes [provider]  metFORMIN (GLUCOPHAGE-XR) 500 MG 24 hr tablet Take 500 mg by mouth daily with breakfast.   Yes [provider]  nitroGLYCERIN (NITRODUR - DOSED IN MG/24 HR) 0.4 mg/hr  patch Place 1 patch onto the skin daily. Changes patch every 12 hours 08/14/17  Yes [provider]  nitroGLYCERIN (NITROSTAT) 0.4 MG SL tablet Place 0.4 mg under the tongue every 5 (five) minutes as needed for chest pain.   Yes [provider]  traZODone (DESYREL) 50 MG tablet Take 1 tablet by mouth at bedtime. 08/12/17  Yes [provider]  nicotine (NICODERM CQ - DOSED IN MG/24 HOURS) 14 mg/24hr patch Place 1 patch (14 mg total) daily onto the skin. Patient not taking: Reported on 09/11/2017 04/29/17   Max Sane, MD    Allergies Dulaglutide  Family History  Problem Relation Age of Onset  . Heart attack Father   . Heart disease Father   . Hypertension Father   . Hyperlipidemia Father   . Hypertension Mother   . Hyperlipidemia Mother     Social History Social History   Tobacco Use  . Smoking status: Current Every Day Smoker    Packs/day: 0.30    Years: 41.00    Pack years: 12.30    Types: Cigarettes  . Smokeless tobacco: Never Used  Substance Use Topics  . Alcohol use: No  . Drug use: No    Review of Systems Constitutional: No fever/chills Eyes: No visual changes. ENT: No sore throat. Cardiovascular: Chest pain as  described above Respiratory: Shortness of breath as described above Gastrointestinal: No abdominal pain.  No nausea, no vomiting.  No diarrhea.  No constipation. Genitourinary: Negative for dysuria. Musculoskeletal: Negative for neck pain.  Negative for back pain. Integumentary: Negative for rash. Neurological: Negative for headaches, focal weakness or numbness.   ____________________________________________   PHYSICAL EXAM:  VITAL SIGNS: ED Triage Vitals  Enc Vitals Group     BP 11/21/17 0110 (!) 99/53     Pulse Rate 11/21/17 0110 (!) 120     Resp 11/21/17 0110 18     Temp 11/21/17 0110 98.5 F (36.9 C)     Temp Source 11/21/17 0110 Oral     SpO2 11/21/17 0110 100 %     Weight 11/21/17 0108 106.6 kg (235 lb)     Height 11/21/17 0108 1.702 m (5\' 7" )     Head Circumference --      Peak Flow --      Pain Score 11/21/17 0108 7     Pain Loc --      Pain Edu? --      Excl. in [place]? --     Constitutional: Alert and oriented. Well appearing and in no acute distress. Eyes: Conjunctivae are normal.  Head: Atraumatic. Nose: No congestion/rhinnorhea. Mouth/Throat: Mucous membranes are moist. Neck: No stridor.  No meningeal signs.   Cardiovascular: Normal rate, regular rhythm. Good peripheral circulation. Grossly normal heart sounds. Respiratory: Normal respiratory effort.  No retractions. Lungs CTAB. Gastrointestinal: Soft and nontender. No distention.  Musculoskeletal: No lower extremity tenderness nor edema. No gross deformities of extremities. Neurologic:  Normal speech and language. No gross focal neurologic deficits are appreciated.  Skin:  Skin is warm, dry and intact. No rash noted. Psychiatric: Mood and affect are normal. Speech and behavior are normal.  ____________________________________________   LABS (all labs ordered are listed, but only abnormal results are displayed)  Labs Reviewed  BASIC METABOLIC PANEL - Abnormal; Notable for the following components:       Result Value   Sodium 133 (*)    Chloride 96 (*)    Glucose, Bld 189 (*)    All other components within  normal limits  CBC - Abnormal; Notable for the following components:   WBC 10.8 (*)    Hemoglobin 12.9 (*)    HCT 37.4 (*)    MCV 75.4 (*)    MCH 25.9 (*)    RDW 23.6 (*)    All other components within normal limits  TROPONIN I - Abnormal; Notable for the following components:   Troponin I 0.10 (*)    All other components within normal limits  BRAIN NATRIURETIC PEPTIDE - Abnormal; Notable for the following components:   B Natriuretic Peptide 206.0 (*)    All other components within normal limits  HEPATIC FUNCTION PANEL - Abnormal; Notable for the following components:   Albumin 3.4 (*)    ALT 12 (*)    All other components within normal limits  MRSA PCR SCREENING  LIPASE, BLOOD  PROTIME-INR  APTT  CK  MAGNESIUM  URINE DRUG SCREEN, QUALITATIVE ([facility] ONLY)  TROPONIN I  TROPONIN I   ____________________________________________  EKG  ED ECG REPORT I, Hinda Kehr, the attending physician, personally viewed and interpreted this ECG.  Date: 11/21/2017 EKG Time: 1:11 AM Rate: 122 Rhythm: Sinus tachycardia with occasional PVC QRS Axis: Left axis deviation Intervals: Right bundle branch block ST/T Wave abnormalities: Lateral ST depression that is new when compared to prior EKG from about 7 months ago. Narrative Interpretation: Lateral ST T changes concerning for acute ischemia, but does not meet STEMI criteria.   ____________________________________________  RADIOLOGY I, Hinda Kehr, personally viewed and evaluated these images (plain radiographs) as part of my medical decision making, as well as reviewing the written report by the radiologist.  ED MD interpretation: Cardiomegaly, no pulmonary edema or pleural effusions  Official radiology report(s): Dg Chest 2 View  Result Date: 11/21/2017 CLINICAL DATA:  Chest pain and shortness of breath. EXAM: CHEST - 2 VIEW  COMPARISON:  Most recent radiographs and CT 04/22/2017 FINDINGS: Borderline cardiomegaly, similar to prior exam. There is atherosclerosis of the aortic arch. Central peribronchial thickening without evidence of superimposed edema. No focal airspace disease, pleural effusion or pneumothorax. Chronic deformity of the distal right clavicle. IMPRESSION: Borderline cardiomegaly. Chronic central bronchial thickening. Resolved pulmonary edema and pleural effusions from prior exam Electronically Signed   By: Jeb Levering M.D.   On: 11/21/2017 01:24    ____________________________________________   PROCEDURES  Critical Care performed: Yes, see critical care procedure note(s)   Procedure(s) performed:   .Critical Care Performed by: Hinda Kehr, MD Authorized by: Hinda Kehr, MD   Critical care provider statement:    Critical care time (minutes):  30   Critical care time was exclusive of:  Separately billable procedures and treating other patients   Critical care was necessary to treat or prevent imminent or life-threatening deterioration of the following conditions:  Cardiac failure (NSTEMI)   Critical care was time spent personally by me on the following activities:  Development of treatment plan with patient or surrogate, discussions with consultants, evaluation of patient's response to treatment, examination of patient, obtaining history from patient or surrogate, ordering and performing treatments and interventions, ordering and review of laboratory studies, ordering and review of radiographic studies, pulse oximetry, re-evaluation of patient's condition and review of old charts     ____________________________________________   INITIAL IMPRESSION / [place] / ED COURSE  As part of my medical decision making, I reviewed the following data within the electronic MEDICAL RECORD NUMBER History obtained from family, Nursing notes reviewed and incorporated, Labs reviewed , EKG  interpreted , Old EKG reviewed, Old chart reviewed, Discussed with admitting physician  and Notes from prior ED visits  Differential diagnosis includes, but is not limited to, ACS, pneumonia, pneumothorax, musculoskeletal pain.  The patient has an extensive cardiovascular history.  I reviewed his chart and saw that he last had a heart catheterization in November 2018 with multiple vessels with various degrees of stenosis.  He already has several stents in his extremities according to his own report.  I also reviewed his troponins and it does not appear he has ever had any troponin elevated greater than 0.05.  Today's labs are notable for a troponin of 0.1 in the setting of a history of present illness very strongly suggestive of ACS.  He also has a concerning EKG which demonstrates new lateral ischemic changes compared to his last EKG from about 7 months ago.  He is currently comfortable even though he still reporting some chest pain.  He is declining additional nitroglycerin at this time.  I am giving him a full dose aspirin, Brilinta 180 mg by mouth, and heparin 4000 units IV with a heparin infusion as per NSTEMI protocol.  He is currently hemodynamically stable but I am concerned about this being ACS.  I will speak with the hospitalist as soon as possible.  I am also giving him a small fluid bolus; he reports he was working outside all day today and I think he would benefit from some additional preload in spite of a documented ejection fraction of 25 to 30% as of 7 months ago.  Clinical Course as of Nov 22 514  Fri Nov 21, 2017  0256 Discussed with Dr. Jannifer Franklin in person.  He will admit.   [CF]    Clinical Course User Index [CF] Hinda Kehr, MD    ____________________________________________  FINAL CLINICAL IMPRESSION(S) / ED DIAGNOSES  Final diagnoses:  NSTEMI (non-ST elevated myocardial infarction) ([place])  Hyperglycemia     MEDICATIONS GIVEN DURING THIS VISIT:  Medications  aspirin  EC tablet 81 mg (has no administration in time range)  atorvastatin (LIPITOR) tablet 80 mg (has no administration in time range)  acetaminophen (TYLENOL) tablet 650 mg (has no administration in time range)    Or  acetaminophen (TYLENOL) suppository 650 mg (has no administration in time range)  ondansetron (ZOFRAN) tablet 4 mg (has no administration in time range)    Or  ondansetron (ZOFRAN) injection 4 mg (has no administration in time range)  insulin aspart (novoLOG) injection 0-9 Units (has no administration in time range)  heparin ADULT infusion 100 units/mL (25000 units/285mL sodium chloride 0.45%) (1,200 Units/hr Intravenous New Bag/Given 11/21/17 0436)  morphine 2 MG/ML injection 1-2 mg (has no administration in time range)  aspirin chewable tablet 324 mg (324 mg Oral Given 11/21/17 0315)  heparin injection 4,000 Units (4,000 Units Intravenous Given 11/21/17 0315)  sodium chloride 0.9 % bolus 500 mL (0 mLs Intravenous Stopped 11/21/17 0349)  ticagrelor (BRILINTA) tablet 180 mg (180 mg Oral Given 11/21/17 0459)     ED Discharge Orders    None       Note:  This document was prepared using Dragon voice recognition software and may include unintentional dictation errors.    Hinda Kehr, MD 11/21/17 [phone]

## 2017-11-21 NOTE — ED Notes (Signed)
Pt called 4x from lobby with no response, this tech walked outside around building in attempts to locate pt with no success. RN (lisa) notified

## 2017-11-21 NOTE — ED Notes (Signed)
Attempted to call every contact number listed in pt's demographics with no answer; charge nurse notified

## 2017-11-21 NOTE — H&P (Signed)
[facility] [place] NAME: [name] [name]    MR#:  [id number]  DATE OF BIRTH:  25-May-1961  DATE OF ADMISSION:  11/21/2017  PRIMARY CARE PHYSICIAN: Clarisse Gouge, MD   REQUESTING/REFERRING PHYSICIAN: Karma Greaser, MD  CHIEF COMPLAINT:   Chief Complaint  Patient presents with  . Chest Pain    HISTORY OF PRESENT ILLNESS:  [name] [name]  is a 57 y.o. male who presents with chest pain.  Patient states that his pain started about 10:30 in the evening, he was outside his house being moderately active at that time.  The pain was acute in onset, centrally located, radiated to his jaw, was associated with significant shortness of breath.  It persisted for couple of hours until he finally came to the ED for evaluation.  Here his troponin is elevated.  He has a history of significant coronary disease, as well as CHF.  He had some EKG changes, most notably in his lateral leads.  He did not have STEMI, hospitalist were called for admission.  PAST MEDICAL HISTORY:   Past Medical History:  Diagnosis Date  . Arthritis   . Asthma   . Atherosclerosis   . BPH (benign prostatic hyperplasia)   . Chronic diastolic CHF (congestive heart failure) ([place])    a. 02/2013 EF 50% by LV gram.  . COPD (chronic obstructive pulmonary disease) ([place])   . Coronary artery disease    a. Status post LAD PCI in 2013 at [facility];  b. October 2014 - Nuclear stress test showed inferior wall defect->Cath: patent LAD stent with 90% stenosis in the proximal RCA ( 2.5 x 23 mm DES), EF 50%;  c. 02/2014 MV w/ large inferior scar->Cath: LAD 76m, LCX 62m, 20d, RCA 20p/m, patent stent->Med Rx.  . Diabetes mellitus without complication ([place])   . Hernia   . Hyperlipidemia   . Hypertension   . Morbid obesity ([place])   . Neuropathy   . PAD (peripheral artery disease) ([place])    a. Angiography in March of 2012 showed 70-80% proximal right SFA stenosis status post self-expanding stent placement (6X60  mm). Angiography in April 2012 showed proximal left SFA occlusion status post self-expanding stent placement (7 X unkonw length). Both done by Dr. Lucky Cowboy.  . Peripheral vascular disease ([place])   . Sleep apnea   . Syncope and collapse   . Tobacco use    a. 75+ yr hx - still smoking 1ppd, down from 2 ppd.  . Varicose vein      PAST SURGICAL HISTORY:   Past Surgical History:  Procedure Laterality Date  . ABDOMINAL AORTAGRAM N/A 06/23/2013   Procedure: ABDOMINAL Maxcine Ham;  Surgeon: Wellington Hampshire, MD;  Location: [facility] CATH LAB;  Service: Cardiovascular;  Laterality: N/A;  . APPENDECTOMY    . CARDIAC CATHETERIZATION  10/14   [facility]; X1 STENT PROXIMAL RCA  . CARDIAC CATHETERIZATION  02/2010   [facility]  . CARDIAC CATHETERIZATION  02/21/2014   [facility]  . CLAVICLE SURGERY    . HERNIA REPAIR    . LEFT HEART CATH AND CORS/GRAFTS ANGIOGRAPHY N/A 04/28/2017   Procedure: LEFT HEART CATH AND CORONARY ANGIOGRAPHY;  Surgeon: Wellington Hampshire, MD;  Location: [facility] CV LAB;  Service: Cardiovascular;  Laterality: N/A;  . PERIPHERAL ARTERIAL STENT GRAFT     x2 left/right  . PERIPHERAL VASCULAR CATHETERIZATION N/A 07/06/2015   Procedure: Abdominal Aortogram w/Lower Extremity;  Surgeon: Algernon Huxley, MD;  Location: [facility] CV LAB;  Service: Cardiovascular;  Laterality: N/A;  . PERIPHERAL VASCULAR CATHETERIZATION  07/06/2015   Procedure: Lower Extremity Intervention;  Surgeon: Algernon Huxley, MD;  Location: [facility] CV LAB;  Service: Cardiovascular;;     SOCIAL HISTORY:   Social History   Tobacco Use  . Smoking status: Current Every Day Smoker    Packs/day: 0.30    Years: 41.00    Pack years: 12.30    Types: Cigarettes  . Smokeless tobacco: Never Used  Substance Use Topics  . Alcohol use: No     FAMILY HISTORY:   Family History  Problem Relation Age of Onset  . Heart attack Father   . Heart disease Father   . Hypertension Father   . Hyperlipidemia Father   . Hypertension Mother   .  Hyperlipidemia Mother      DRUG ALLERGIES:   Allergies  Allergen Reactions  . Dulaglutide Anaphylaxis, Diarrhea and Hives    MEDICATIONS AT HOME:   Prior to Admission medications   Medication Sig Start Date End Date Taking? Authorizing Provider  albuterol (PROVENTIL HFA;VENTOLIN HFA) 108 (90 BASE) MCG/ACT inhaler Inhale 2 puffs into the lungs every 6 (six) hours as needed for wheezing or shortness of breath. 04/15/13  Yes Wellington Hampshire, MD  aspirin EC 81 MG tablet Take 1 tablet (81 mg total) by mouth daily. 05/11/15  Yes Wellington Hampshire, MD  atorvastatin (LIPITOR) 80 MG tablet Take 80 mg by mouth daily.   Yes [provider]  carvedilol (COREG) 6.25 MG tablet Take 1 tablet by mouth 2 (two) times daily. 09/10/17 09/10/18 Yes [provider]  clopidogrel (PLAVIX) 75 MG tablet Take 1 tablet by mouth daily. 08/22/17  Yes [provider]  furosemide (LASIX) 40 MG tablet Take 1 tablet (40 mg total) 2 (two) times daily by mouth. Patient taking differently: Take 40 mg by mouth daily as needed for fluid.  04/29/17 04/29/18 Yes Max Sane, MD  gabapentin (NEURONTIN) 800 MG tablet Take 1 tablet by mouth 4 (four) times daily. 08/14/17 08/14/18 Yes [provider]  Insulin Glargine (LANTUS SOLOSTAR) 100 UNIT/ML Solostar Pen Inject 40-80 Units into the skin 2 (two) times daily as needed. 80 in am; 40 units at night 01/21/17  Yes [provider]  insulin lispro (HUMALOG) 100 UNIT/ML injection Inject 0-10 Units into the skin 3 (three) times daily with meals. If BS is 0 to 70 no insulin and eat/glucose tablets/glucose get If BS 71 to 150 great! If BS 151 to 200, 2 Units Humaog If BS 201 to 250, 4 Units Humalog If BS 251 to 300, 6 Units Humalog If BS 301 to 350, 8 Units Humalog If BS 351 to 400, 10 Units Humalog If BS greater than 400, take 10 Units and Contact our office during office hour   Yes [provider]  ipratropium-albuterol (DUONEB) 0.5-2.5  (3) MG/3ML SOLN Inhale 3 mLs into the lungs 3 (three) times daily.   Yes [provider]  lisinopril (PRINIVIL,ZESTRIL) 5 MG tablet Take 1 tablet by mouth daily. 09/10/17 09/10/18 Yes [provider]  metFORMIN (GLUCOPHAGE-XR) 500 MG 24 hr tablet Take 500 mg by mouth daily with breakfast.   Yes [provider]  nitroGLYCERIN (NITRODUR - DOSED IN MG/24 HR) 0.4 mg/hr patch Place 1 patch onto the skin daily. Changes patch every 12 hours 08/14/17  Yes [provider]  nitroGLYCERIN (NITROSTAT) 0.4 MG SL tablet Place 0.4 mg under the tongue every 5 (five) minutes as needed for  chest pain.   Yes [provider]  traZODone (DESYREL) 50 MG tablet Take 1 tablet by mouth at bedtime. 08/12/17  Yes [provider]  nicotine (NICODERM CQ - DOSED IN MG/24 HOURS) 14 mg/24hr patch Place 1 patch (14 mg total) daily onto the skin. Patient not taking: Reported on 09/11/2017 04/29/17   Max Sane, MD    REVIEW OF SYSTEMS:  Review of Systems  Constitutional: Negative for chills, fever, malaise/fatigue and weight loss.  HENT: Negative for ear pain, hearing loss and tinnitus.   Eyes: Negative for blurred vision, double vision, pain and redness.  Respiratory: Positive for shortness of breath. Negative for cough and hemoptysis.   Cardiovascular: Positive for chest pain. Negative for palpitations, orthopnea and leg swelling.  Gastrointestinal: Negative for abdominal pain, constipation, diarrhea, nausea and vomiting.  Genitourinary: Negative for dysuria, frequency and hematuria.  Musculoskeletal: Negative for back pain, joint pain and neck pain.  Skin:       No acne, rash, or lesions  Neurological: Negative for dizziness, tremors, focal weakness and weakness.  Endo/Heme/Allergies: Negative for polydipsia. Does not bruise/bleed easily.  Psychiatric/Behavioral: Negative for depression. The patient is not nervous/anxious and does not have insomnia.      VITAL SIGNS:    Vitals:   11/21/17 0236 11/21/17 0237 11/21/17 0238 11/21/17 0300  BP: 134/71   (!) 84/56  Pulse:  (!) 113  (!) 106  Resp: (!) 28 (!) 26  18  Temp:      TempSrc:      SpO2:  100% 100% 92%  Weight:      Height:       Wt Readings from Last 3 Encounters:  11/21/17 106.6 kg (235 lb)  09/11/17 114.3 kg (252 lb)  05/28/17 106.2 kg (234 lb 2 oz)    PHYSICAL EXAMINATION:  Physical Exam  Vitals reviewed. Constitutional: He is oriented to person, place, and time. He appears well-developed and well-nourished. No distress.  HENT:  Head: Normocephalic and atraumatic.  Mouth/Throat: Oropharynx is clear and moist.  Eyes: Pupils are equal, round, and reactive to light. Conjunctivae and EOM are normal. No scleral icterus.  Neck: Normal range of motion. Neck supple. No JVD present. No thyromegaly present.  Cardiovascular: Intact distal pulses. Exam reveals no gallop and no friction rub.  No murmur heard. Tachycardic, frequent PVCs  Respiratory: Effort normal and breath sounds normal. No respiratory distress. He has no wheezes. He has no rales.  GI: Soft. Bowel sounds are normal. He exhibits no distension. There is no tenderness.  Musculoskeletal: Normal range of motion. He exhibits no edema.  No arthritis, no gout  Lymphadenopathy:    He has no cervical adenopathy.  Neurological: He is alert and oriented to person, place, and time. No cranial nerve deficit.  No dysarthria, no aphasia  Skin: Skin is warm and dry. No rash noted. No erythema.  Psychiatric: He has a normal mood and affect. His behavior is normal. Judgment and thought content normal.    LABORATORY PANEL:   CBC Recent Labs  Lab 11/21/17 0115  WBC 10.8*  HGB 12.9*  HCT 37.4*  PLT 282   ------------------------------------------------------------------------------------------------------------------  Chemistries  Recent Labs  Lab 11/21/17 0115  NA 133*  K 4.0  CL 96*  CO2 26  GLUCOSE 189*  BUN 16  CREATININE  1.09  CALCIUM 9.0   ------------------------------------------------------------------------------------------------------------------  Cardiac Enzymes Recent Labs  Lab 11/21/17 0115  TROPONINI 0.10*   ------------------------------------------------------------------------------------------------------------------  RADIOLOGY:  Dg Chest 2 View  Result Date: 11/21/2017 CLINICAL DATA:  Chest pain and shortness of breath. EXAM: CHEST - 2 VIEW COMPARISON:  Most recent radiographs and CT 04/22/2017 FINDINGS: Borderline cardiomegaly, similar to prior exam. There is atherosclerosis of the aortic arch. Central peribronchial thickening without evidence of superimposed edema. No focal airspace disease, pleural effusion or pneumothorax. Chronic deformity of the distal right clavicle. IMPRESSION: Borderline cardiomegaly. Chronic central bronchial thickening. Resolved pulmonary edema and pleural effusions from prior exam Electronically Signed   By: Jeb Levering M.D.   On: 11/21/2017 01:24    EKG:   Orders placed or performed during the hospital encounter of 11/21/17  . ED EKG within 10 minutes  . ED EKG within 10 minutes    IMPRESSION AND PLAN:  Principal Problem:   NSTEMI (non-ST elevated myocardial infarction) ([place]) -start IV heparin, trend cardiac enzymes, give 1 dose of Brilinta as well, cardiology consult, admit to stepdown unit with echocardiogram in the morning Active Problems:   Coronary artery disease -continue home meds, other work-up as above   Hypertension -hold antihypertensives for now as the patient's blood pressure is borderline low.   Chronic systolic CHF (congestive heart failure) ([facility]) -continue home meds   Diabetes ([place]) -sliding scale insulin with corresponding glucose checks   Hyperlipidemia -home dose statin  Chart review performed and case discussed with ED provider. Labs, imaging and/or ECG reviewed by provider and discussed with patient/family. Management plans  discussed with the patient and/or family.  DVT PROPHYLAXIS: Systemic anticoagulation  GI PROPHYLAXIS: None  ADMISSION STATUS: Inpatient  CODE STATUS: Full Code Status History    Date Active Date Inactive Code Status Order ID Comments User Context   04/23/2017 0355 04/29/2017 1353 Full Code [id number]  Saundra Shelling, MD Inpatient   07/11/2016 0324 07/11/2016 1851 Full Code [id number]  Saundra Shelling, MD Inpatient   05/06/2016 0005 05/07/2016 1417 Full Code [id number]  Harvie Bridge, DO Inpatient   11/21/2015 1716 11/22/2015 1950 Full Code [id number]  Gladstone Lighter, MD Inpatient   07/06/2015 0948 07/06/2015 1437 Full Code [id number]  Algernon Huxley, MD Inpatient      TOTAL TIME TAKING CARE OF THIS PATIENT: 45 minutes.   Hollyanne Schloesser Pillager 11/21/2017, 3:24 AM  [employer]  [phone]  CC: Primary care physician; Clarisse Gouge, MD  Note:  This document was prepared using Dragon voice recognition software and may include unintentional dictation errors.

## 2017-11-21 NOTE — ED Triage Notes (Addendum)
Patient ambulatory to triage with steady gait, without difficulty or distress noted; pt reports upper CP radiating into neck accomp by [facility] since 1030pm; pt reports some relief in pain after NTG

## 2017-11-21 NOTE — Consult Note (Signed)
Troponin rose from 0.10 on arrival to 0.92 this morning.  Cardiology Consult    Patient ID: [name] [name] MRN: [id number], DOB/AGE: July 30, 1960   Admit date: 11/21/2017 Date of Consult: 11/21/2017  Primary Physician: Clarisse Gouge, MD Primary Cardiologist: D. Felicita Gage, MD - [facility] Requesting Provider: Ashok Norris, MD  Patient Profile    [name] [name] is a 57 y.o. 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 Jerelyn Charles.   Past Medical History   Past Medical History:  Diagnosis Date  . Arthritis   . Asthma   . Atherosclerosis   . BPH (benign prostatic hyperplasia)   . Carotid arterial disease ([place])   . Charcot's joint of foot, right   . Chronic combined systolic (congestive) and diastolic (congestive) heart failure ([place])    a. 02/2013 EF 50% by LV gram; b. 04/2017 Echo: EF 25-30%. diff HK. Gr2 DD. Mod dil LA/RV. PASP 88mmHg.  [name] COPD (chronic obstructive pulmonary disease) ([place])   . Coronary artery disease    a. 2013 S/P PCI of LAD [facility]);  b. 03/2013 PCI: RCA 90p ( 2.5 x 23 mm DES); c. 02/2014 Cath: patent RCA stent->Med Rx; d. 04/2017 Cath: LM nl, LAd 30ost/p, 60m/d, D1 80ost, LCX 90p/m, OM1 85, RCA 70/20p, 76m, 70d->Referred for CT Surg-felt to be poor candidate.  . Diabetic neuropathy ([place])   . Diabetic ulcer of right foot ([place])   . Hernia   . Hyperlipidemia   . Hypertension   . Ischemic cardiomyopathy    a. 04/2017 Echo: EF 25-30%; b. 08/2017 Cardiac MRI ([facility]): EF 19%, sev glob HK. RVEF 20%, mod BAE, triv MR, mild-mod TR. Basal lateral subendocardial infarct (viable), inf/infsept, dist septal ischemia, basal to mid lat peri-infarct ischemia.  . Morbid obesity ([place])   . Neuropathy    . PAD (peripheral artery disease) ([place])    a. Followed by Dr. Lucky Cowboy; b. 08/2010 Periph Angio: RSFA 70-80p (6X60 self-expanding stent); c. 09/2010 Periph Angio: L SFA 100p (7x unknown length self-expanding stent); d. 06/2015 Periph Angio: R SFA short segment occlusion (6x12 self-expanding stent).  . Restless leg syndrome   . Sleep apnea   . Subclavian artery stenosis, right ([place])   . Syncope and collapse   . Tobacco use    a. 75+ yr hx - still smoking 1ppd, down from 2 ppd.  . Type II diabetes mellitus ([place])   . Varicose vein     Past Surgical History:  Procedure Laterality Date  . ABDOMINAL AORTAGRAM N/A 06/23/2013   Procedure: ABDOMINAL Maxcine Ham;  Surgeon: Wellington Hampshire, MD;  Location: [facility];  Service: Cardiovascular;  Laterality: N/A;  . APPENDECTOMY    . CARDIAC CATHETERIZATION  10/14   [facility]; X1 STENT PROXIMAL RCA  . CARDIAC CATHETERIZATION  02/2010   [facility]  . CARDIAC CATHETERIZATION  02/21/2014   [facility]  . CLAVICLE SURGERY    . HERNIA REPAIR    . LEFT HEART CATH AND CORS/GRAFTS ANGIOGRAPHY N/A 04/28/2017   Procedure: LEFT HEART CATH AND CORONARY ANGIOGRAPHY;  Surgeon: Wellington Hampshire, MD;  Location: [facility];  Service: Cardiovascular;  Laterality: N/A;  . PERIPHERAL ARTERIAL STENT GRAFT  x2 left/right  . PERIPHERAL VASCULAR CATHETERIZATION N/A 07/06/2015   Procedure: Abdominal Aortogram w/Lower Extremity;  Surgeon: Algernon Huxley, MD;  Location: [facility];  Service: Cardiovascular;  Laterality: N/A;  . PERIPHERAL VASCULAR CATHETERIZATION  07/06/2015   Procedure: Lower Extremity Intervention;  Surgeon: Algernon Huxley, MD;  Location: [employer] [facility];  Service: Cardiovascular;;     Allergies  Allergies  Allergen Reactions  . Dulaglutide Anaphylaxis, Diarrhea and Hives    History of Present Illness    57 y.o. ? 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  He has required interventions to his LAD (2013) and RCA (2014).  In Nov 2018, he was admitted with CHF and was found to have new LV dysfxn with an EF of 25-30%.  Cath was undertaken and revealed severe multivessel CAD.  It was felt that he would be best served with Ct surgery referral.  Following discharge, he sought second opinion @ [facility] and has been followed there since.  Cardiac MRI was performed in March, revealing an EF of 19% with multiple areas of ischemia/viability (Basal lateral subendocardial infarct (viable), inf/infsept, dist septal ischemia, basal to mid lat peri-infarct ischemia).  He was referred to CT surgery @ [facility] and seen in April.  According to CT surgical consult note, he was felt to be a candidate for CABG but after further discussion with primary cardiologist, it was felt that he would be high risk and would benefit from optimization of medical therapy and co-morbidities first.  He was last seen in cardiology clinic @ [place] on 6/3, at which time he was doing reasonably well.  He was having some LH and his spironolactone dose was halved.  He says that since then, he has done well w/o LH.  He had not been having any significant chest pain but also says he's been taking it easy.    On 6/13, he was helping to build a concrete structure and noted intermittent chest discomfort with dyspnea.  This was intermittent throughout the day and he says he didn't think too much of it b/c he was exerting himself pretty hard.  Ss improved with rest but would return with activity.  At 2230, he was getting ready for bed and had recurrent chest discomfort similar to prior angina.  He took ntg with some but not complete relief.  He eventually presented to the ED @ ~ 1am after 2.5 hrs of ongoing symptoms.  Here, ECG showed sinus tach with a new RBBB (previouly noted to  have incomplete LBBB).  Initial trop elevated @ 0.10.  He was placed on heparin and c/p eventually resolved.  He was given a brilinta load of 180mg  in the ED (on chronic plavix - no recent noncompliance) and admitted.  BP meds held on admission due to soft BPs in the ED.  He is chest pain free this AM. BP now up and he continues to be tachycardic.  Inpatient Medications    . [START ON 11/22/2017] aspirin EC  81 mg Oral Daily  . atorvastatin  80 mg Oral Daily  . carvedilol  6.25 mg Oral BID WC  . [START ON 11/22/2017] clopidogrel  75 mg Oral Daily  . insulin aspart  0-9 Units Subcutaneous Q6H  Family History    Family History  Problem Relation Age of Onset  . Heart attack Father   . Heart disease Father   . Hypertension Father   . Hyperlipidemia Father   . Hypertension Mother   . Hyperlipidemia Mother    indicated that his mother is deceased. He indicated that his father is deceased.   Social History    Social History   Socioeconomic History  . Marital status: Single    Spouse name: Not on file  . Number of children: 0  . Years of education: 64  . Highest education level: 12th grade  Occupational History  . Occupation: on disability  Social Needs  . Financial resource strain: Somewhat hard  . Food insecurity:    Worry: Sometimes true    Inability: Sometimes true  . Transportation needs:    Medical: No    Non-medical: No  Tobacco Use  . Smoking status: Current Every Day Smoker    Packs/day: 1.00    Years: 41.00    Pack years: 41.00    Types: Cigarettes  . Smokeless tobacco: Never Used  Substance and Sexual Activity  . Alcohol use: No  . Drug use: No  . Sexual activity: Never  Lifestyle  . Physical activity:    Days per week: 7 days    Minutes per session: 30 min  . Stress: Very much  Relationships  . Social connections:    Talks on phone: More than three times a week    Gets together: Once a week    Attends religious service: Never    Active member of  club or organization: No    Attends meetings of clubs or organizations: Never    Relationship status: Living with partner  . Intimate partner violence:    Fear of current or ex partner: No    Emotionally abused: No    Physically abused: No    Forced sexual activity: No  Other Topics Concern  . Not on file  Social History Narrative   Lives at home in [place] with family. Independent at baseline.     Review of Systems    General:  No chills, fever, night sweats or weight changes.  Cardiovascular:  +++ chest pain, +++ dyspnea on exertion, mild RLE edema, no orthopnea, palpitations, paroxysmal nocturnal dyspnea. Dermatological: No rash, lesions/masses Respiratory: No cough, +++ dyspnea Urologic: No hematuria, dysuria Abdominal:   No nausea, vomiting, diarrhea, bright red blood per rectum, melena, or hematemesis Neurologic:  No visual changes, wkns, changes in mental status. All other systems reviewed and are otherwise negative except as noted above.  Physical Exam    Blood pressure 128/70, pulse 99, temperature 98 F (36.7 C), temperature source Oral, resp. rate 14, height 5\' 7"  (1.702 m), weight 231 lb 11.3 oz (105.1 kg), SpO2 98 %.  General: Pleasant, NAD Psych: Normal affect. Neuro: Alert and oriented X 3. Moves all extremities spontaneously. HEENT: Normal  Neck: Supple without JVD.  Bilat bruits. Lungs:  Resp regular and unlabored, diminished breath sounds bilat with rhonchi and faint insp/exp wheezing throughout. Heart: RRR tachy, + S4.  No murmurs. Abdomen: Soft, non-tender, non-distended, BS + x 4.  Extremities: No clubbing, cyanosis.  Trace RLE edema. Dsg to right mid foot. PT/Radials 1+ and equal bilaterally.  Labs     Recent Labs    11/21/17 0115 11/21/17 0251 11/21/17 0812  CKTOTAL  --  202  --   TROPONINI 0.10*  --  0.92*  Lab Results  Component Value Date   WBC 10.8 (H) 11/21/2017   HGB 12.9 (L) 11/21/2017   HCT 37.4 (L) 11/21/2017   MCV 75.4 (L)  11/21/2017   PLT 282 11/21/2017    Recent Labs  Lab 11/21/17 0115 11/21/17 0251  NA 133*  --   K 4.0  --   CL 96*  --   CO2 26  --   BUN 16  --   CREATININE 1.09  --   CALCIUM 9.0  --   PROT  --  7.1  BILITOT  --  0.7  ALKPHOS  --  93  ALT  --  12*  AST  --  23  GLUCOSE 189*  --    Lab Results  Component Value Date   CHOL 147 04/23/2017   HDL 23 (L) 04/23/2017   LDLCALC 107 (H) 04/23/2017   TRIG 87 04/23/2017    Radiology Studies    Dg Chest 2 View  Result Date: 11/21/2017 CLINICAL DATA:  Chest pain and shortness of breath. EXAM: CHEST - 2 VIEW COMPARISON:  Most recent radiographs and CT 04/22/2017 FINDINGS: Borderline cardiomegaly, similar to prior exam. There is atherosclerosis of the aortic arch. Central peribronchial thickening without evidence of superimposed edema. No focal airspace disease, pleural effusion or pneumothorax. Chronic deformity of the distal right clavicle. IMPRESSION: Borderline cardiomegaly. Chronic central bronchial thickening. Resolved pulmonary edema and pleural effusions from prior exam Electronically Signed   By: Jeb Levering M.D.   On: 11/21/2017 01:24    ECG & Cardiac Imaging    Sinus tachycardia, 108, LAD, RBBB (new).  Assessment & Plan    1.  NSTEMI/CAD: Patient with known, severe, diffuse.  Multivessel coronary artery disease by diagnostic catheterization in November 2018.  He has been followed closely at [facility] with cardiac MRI in March showed multiple areas of ischemia with reversibility/viability.  Current goal is for optimization of medications, LV function, and management of comorbidities including ongoing tobacco abuse and diabetes with potential consideration for high risk bypass.  Patient says he has been doing well but then developed chest pain with heavy exertion yesterday followed by worsening chest discomfort last night.  Symptoms persisted for about 3 to 4 hours prior to resolving in the emergency department.  He is chest  pain-free this morning.  Continue heparin, aspirin, statin, and Plavix, all of which she reports compliance with at home.  He was also loaded with Brilinta in the ED.  Heart rates have been elevated since admission and initially, beta-blocker and ACE inhibitor therapy were held secondary to low blood pressures in the emergency department.  I am going to resume his beta-blocker this morning.  If pressures are stable, can look to add back lisinopril tomorrow.  Given known severe disease with limited percutaneous options, would not pursue diagnostic catheterization at this time.  Continue medical therapy and as pressure stabilized, plan to add long-acting nitrate therapy.  2.  Chronic combined syst/diast CHF/ICM: Volume appears to be stable this morning.  He has not been having any significant dyspnea at home.  He is on beta-blocker, ACE inhibitor, and low-dose Spironolactone, all of which he has been tolerating.  As above, he has been followed closely at [facility] with plan for follow-up echocardiogram over the summer to reevaluate LV function and candidacy for thoracic surgery.  3.  Essential HTN: Pressure soft on arrival but improved this morning.  Resume beta-blocker.  Plan to resume ACE inhibitor if pressures remain stable.  4.  HL: Continue high potency statin therapy.  5.  Type II DM: Scale insulin per internal medicine.   6.  Tob Abuse/COPD: Complete cessation advised.  Still smoking a pack a day.   Signed, Murray Hodgkins, NP 11/21/2017, 10:29 AM  For questions or updates, please contact   Please consult [email or web address] for contact info under Cardiology/STEMI.

## 2017-11-21 NOTE — Progress Notes (Signed)
Inpatient Diabetes Program Recommendations  AACE/ADA: New Consensus Statement on Inpatient Glycemic Control (2019)  Target Ranges:  Prepandial:   less than 140 mg/dL      Peak postprandial:   less than 180 mg/dL (1-2 hours)      Critically ill patients:  140 - 180 mg/dL   Results for MEET, WEATHINGTON (MRN [id number]) as of 11/21/2017 10:28  Ref. Range 11/21/2017 04:12 11/21/2017 05:45  Glucose-Capillary Latest Ref Range: 65 - 99 mg/dL 202 (H) 178 (H)  Results for SPERO, GUNNELS (MRN [id number]) as of 11/21/2017 10:28  Ref. Range 04/23/2017 04:09  Hemoglobin A1C Latest Ref Range: 4.8 - 5.6 % 8.7 (H)   Review of Glycemic Control  Diabetes history: DM2 Outpatient Diabetes medications: Lantus 80 units QAM, Lantus 40 units QHS, Humalog 0-10 units TID with meals, Metformin XR 500 mg QAM Current orders for Inpatient glycemic control: Novolog 0-9 units Q6H  Inpatient Diabetes Program Recommendations:  Correction (SSI): If patient will remain NPO, please consider changing frequency of CBGs and Novolog to Q4H. HgbA1C: A1C in process. Insulin - Basal: If glucose becomes consistently greater than 180 mg/dl with Novolog correction scale, may need to consider ordering Lantus.  Thanks, Barnie Alderman, RN, MSN, CDE Diabetes Coordinator Inpatient Diabetes Program [phone] (Team Pager from 8am to 5pm)

## 2017-11-21 NOTE — ED Notes (Signed)
Pt returns to STAT desk, stating he had gone outside to move his vehicle; charge nurse notified

## 2017-11-21 NOTE — ED Notes (Signed)
Pt up to standing position to use the urinal. Pt is winded and SOB after using urinal.

## 2017-11-21 NOTE — Progress Notes (Signed)
ANTICOAGULATION CONSULT NOTE - Initial Consult  Pharmacy Consult for heparin drip Indication: chest pain/ACS  Allergies  Allergen Reactions  . Dulaglutide Anaphylaxis, Diarrhea and Hives    Patient Measurements: Height: 5\' 7"  (170.2 cm) Weight: 231 lb 11.3 oz (105.1 kg) IBW/kg (Calculated) : 66.1 Heparin Dosing Weight: 90 kg  Vital Signs: Temp: 98.5 F (36.9 C) (06/14 1307) Temp Source: Oral (06/14 1307) BP: 131/74 (06/14 1307) Pulse Rate: 94 (06/14 1307)  Labs: Recent Labs    11/21/17 0115 11/21/17 0251 11/21/17 0812 11/21/17 1053  HGB 12.9*  --   --   --   HCT 37.4*  --   --   --   PLT 282  --   --   --   APTT  --  34  --   --   LABPROT  --  13.7  --   --   INR  --  1.06  --   --   HEPARINUNFRC  --   --   --  0.13*  CREATININE 1.09  --   --   --   CKTOTAL  --  202  --   --   TROPONINI 0.10*  --  0.92*  --     Estimated Creatinine Clearance: 88.5 mL/min (by C-G formula based on SCr of 1.09 mg/dL).   Medical History: Past Medical History:  Diagnosis Date  . Arthritis   . Asthma   . Atherosclerosis   . BPH (benign prostatic hyperplasia)   . Carotid arterial disease ([place])   . Charcot's joint of foot, right   . Chronic combined systolic (congestive) and diastolic (congestive) heart failure ([place])    a. 02/2013 EF 50% by LV gram; b. 04/2017 Echo: EF 25-30%. diff HK. Gr2 DD. Mod dil LA/RV. PASP 81mmHg.  [name] COPD (chronic obstructive pulmonary disease) ([place])   . Coronary artery disease    a. 2013 S/P PCI of LAD [facility]);  b. 03/2013 PCI: RCA 90p ( 2.5 x 23 mm DES); c. 02/2014 Cath: patent RCA stent->Med Rx; d. 04/2017 Cath: LM nl, LAd 30ost/p, 72m/d, D1 80ost, LCX 90p/m, OM1 85, RCA 70/20p, 50m, 70d->Referred for CT Surg-felt to be poor candidate.  . Diabetic neuropathy ([place])   . Diabetic ulcer of right foot ([place])   . Hernia   . Hyperlipidemia   . Hypertension   . Ischemic cardiomyopathy    a. 04/2017 Echo: EF 25-30%; b. 08/2017 Cardiac MRI ([facility]): EF 19%, sev glob  HK. RVEF 20%, mod BAE, triv MR, mild-mod TR. Basal lateral subendocardial infarct (viable), inf/infsept, dist septal ischemia, basal to mid lat peri-infarct ischemia.  . Morbid obesity ([place])   . Neuropathy   . PAD (peripheral artery disease) ([place])    a. Followed by Dr. Lucky Cowboy; b. 08/2010 Periph Angio: RSFA 70-80p (6X60 self-expanding stent); c. 09/2010 Periph Angio: L SFA 100p (7x unknown length self-expanding stent); d. 06/2015 Periph Angio: R SFA short segment occlusion (6x12 self-expanding stent).  . Restless leg syndrome   . Sleep apnea   . Subclavian artery stenosis, right ([place])   . Syncope and collapse   . Tobacco use    a. 75+ yr hx - still smoking 1ppd, down from 2 ppd.  . Type II diabetes mellitus ([place])   . Varicose vein     Assessment: 57 y/o F with a h/o CAD and CHF admitted for NSTEMI.   Goal of Therapy:  Heparin level 0.3-0.7 units/ml Monitor platelets by anticoagulation protocol: Yes   Plan:  Heparin  level is below goal. Will bolus heparin 2600 units and inrease infusion to 1500 unit/hr. Will recheck a HL in 6 hours.   Ulice Dash D 11/21/2017,1:22 PM

## 2017-11-22 DIAGNOSIS — I25118 Atherosclerotic heart disease of native coronary artery with other forms of angina pectoris: Secondary | ICD-10-CM

## 2017-11-22 LAB — CBC
HCT: 38.2 % — ABNORMAL LOW (ref 40.0–52.0)
Hemoglobin: 12.9 g/dL — ABNORMAL LOW (ref 13.0–18.0)
MCH: 25.6 pg — ABNORMAL LOW (ref 26.0–34.0)
MCHC: 33.8 g/dL (ref 32.0–36.0)
MCV: 75.6 fL — ABNORMAL LOW (ref 80.0–100.0)
Platelets: 277 10*3/uL (ref 150–440)
RBC: 5.05 MIL/uL (ref 4.40–5.90)
RDW: 23.8 % — ABNORMAL HIGH (ref 11.5–14.5)
WBC: 9.8 10*3/uL (ref 3.8–10.6)

## 2017-11-22 LAB — GLUCOSE, CAPILLARY
Glucose-Capillary: 222 mg/dL — ABNORMAL HIGH (ref 65–99)
Glucose-Capillary: 203 mg/dL — ABNORMAL HIGH (ref 65–99)
Glucose-Capillary: 296 mg/dL — ABNORMAL HIGH (ref 65–99)
Glucose-Capillary: 224 mg/dL — ABNORMAL HIGH (ref 65–99)

## 2017-11-22 LAB — TROPONIN I
Troponin I: 2.32 ng/mL (ref <0.03)
Troponin I: 1.67 ng/mL (ref <0.03)
Troponin I: 1.27 ng/mL (ref <0.03)

## 2017-11-22 LAB — HEPARIN LEVEL (UNFRACTIONATED)
Heparin Unfractionated: 0.54 IU/mL (ref 0.30–0.70)
Heparin Unfractionated: 0.47 IU/mL (ref 0.30–0.70)

## 2017-11-22 MED ORDER — NICOTINE 14 MG/24HR TD PT24
14.0000 mg | MEDICATED_PATCH | Freq: Every day | TRANSDERMAL | Status: DC
  Administered 2017-11-22 – 2017-11-23 (×2): 14 mg via TRANSDERMAL
  Filled 2017-11-22: qty 1

## 2017-11-22 NOTE — Progress Notes (Signed)
[name] [name] at [place] NAME: [name] [name]    MR#:  [id number]  DATE OF BIRTH:  1960-09-12  SUBJECTIVE:  CHIEF COMPLAINT:   Chief Complaint  Patient presents with  . Chest Pain  Patient feels much better, no complaints, no events overnight per nursing staff, right lower extremity foot wound being followed on outpatient basis by podiatry, cardiology input appreciated  REVIEW OF SYSTEMS:  CONSTITUTIONAL: No fever, fatigue or weakness.  EYES: No blurred or double vision.  EARS, NOSE, AND THROAT: No tinnitus or ear pain.  RESPIRATORY: No cough, shortness of breath, wheezing or hemoptysis.  CARDIOVASCULAR: No chest pain, orthopnea, edema.  GASTROINTESTINAL: No nausea, vomiting, diarrhea or abdominal pain.  GENITOURINARY: No dysuria, hematuria.  ENDOCRINE: No polyuria, nocturia,  HEMATOLOGY: No anemia, easy bruising or bleeding SKIN: No rash or lesion. MUSCULOSKELETAL: No joint pain or arthritis.   NEUROLOGIC: No tingling, numbness, weakness.  PSYCHIATRY: No anxiety or depression.   ROS  DRUG ALLERGIES:   Allergies  Allergen Reactions  . Dulaglutide Anaphylaxis, Diarrhea and Hives    VITALS:  Blood pressure 140/79, pulse 77, temperature 98.4 F (36.9 C), temperature source Oral, resp. rate 17, height 5\' 7"  (1.702 m), weight 103.7 kg (228 lb 9.6 oz), SpO2 93 %.  PHYSICAL EXAMINATION:  GENERAL:  57 y.o.-year-old patient lying in the bed with no acute distress.  EYES: Pupils equal, round, reactive to light and accommodation. No scleral icterus. Extraocular muscles intact.  HEENT: Head atraumatic, normocephalic. Oropharynx and nasopharynx clear.  NECK:  Supple, no jugular venous distention. No thyroid enlargement, no tenderness.  LUNGS: Normal breath sounds bilaterally, no wheezing, rales,rhonchi or crepitation. No use of accessory muscles of respiration.  CARDIOVASCULAR: S1, S2 normal. No murmurs, rubs, or gallops.  ABDOMEN: Soft,  nontender, nondistended. Bowel sounds present. No organomegaly or mass.  EXTREMITIES: No pedal edema, cyanosis, or clubbing.  NEUROLOGIC: Cranial nerves II through XII are intact. Muscle strength 5/5 in all extremities. Sensation intact. Gait not checked.  PSYCHIATRIC: The patient is alert and oriented x 3.  SKIN: No obvious rash, lesion, or ulcer.   Physical Exam LABORATORY PANEL:   CBC Recent Labs  Lab 11/22/17 0209  WBC 9.8  HGB 12.9*  HCT 38.2*  PLT 277   ------------------------------------------------------------------------------------------------------------------  Chemistries  Recent Labs  Lab 11/21/17 0115 11/21/17 0251  NA 133*  --   K 4.0  --   CL 96*  --   CO2 26  --   GLUCOSE 189*  --   BUN 16  --   CREATININE 1.09  --   CALCIUM 9.0  --   MG  --  1.7  AST  --  23  ALT  --  12*  ALKPHOS  --  93  BILITOT  --  0.7   ------------------------------------------------------------------------------------------------------------------  Cardiac Enzymes Recent Labs  Lab 11/22/17 0209 11/22/17 0754  TROPONINI 2.32* 1.67*   ------------------------------------------------------------------------------------------------------------------  RADIOLOGY:  Dg Chest 2 View  Result Date: 11/21/2017 CLINICAL DATA:  Chest pain and shortness of breath. EXAM: CHEST - 2 VIEW COMPARISON:  Most recent radiographs and CT 04/22/2017 FINDINGS: Borderline cardiomegaly, similar to prior exam. There is atherosclerosis of the aortic arch. Central peribronchial thickening without evidence of superimposed edema. No focal airspace disease, pleural effusion or pneumothorax. Chronic deformity of the distal right clavicle. IMPRESSION: Borderline cardiomegaly. Chronic central bronchial thickening. Resolved pulmonary edema and pleural effusions from prior exam Electronically Signed   By: Fonnie Birkenhead.D.  On: 11/21/2017 01:24    ASSESSMENT AND PLAN:  *Acute non-STEMI  Resolving   Cardiology input appreciated, heparin drip discontinued, watch for 1 more day to ensure stability, aspirin, Plavix, currently being evaluated by [facility] for bypass surgery-we will need to follow-up there for continued medical management/care  *Chronic nonhealing right foot wound Patient will need to follow-up with his podiatrist status post discharge Per cardiology-there is discussion regarding possible amputation of right lower extremity prior to coronary artery grafting/bypass  *Chronic tobacco smoking abuse/dependency Cessation counseling giving, nicotine patch daily  *Chronic benign essential hypertension Stable on current regiment  *Chronic systolic congestive heart failure without exacerbation Stable on current regiment  *Chronic diabetes mellitus type 2 Stable  *Chronic hyperlipidemia, unspecified Continue statin therapy  Tentative discharge for tomorrow  All the records are reviewed and case discussed with Care Management/Social Workerr. Management plans discussed with the patient, family and they are in agreement.  CODE STATUS: full  TOTAL TIME TAKING CARE OF THIS PATIENT: 35 minutes.     POSSIBLE D/C IN 1 DAYS, DEPENDING ON CLINICAL CONDITION.   Avel Peace Salary M.D on 11/22/2017   Between 7am to 6pm - Pager - [phone]  After 6pm go to [email or web address] - password [email or web address] [facility] [place] [facility]  Office  [phone]  CC: Primary care physician; Clarisse Gouge, MD  Note: This dictation was prepared with Dragon dictation along with smaller phrase technology. Any transcriptional errors that result from this process are unintentional.

## 2017-11-22 NOTE — Progress Notes (Signed)
Progress Note  Patient Name: [name] [name] Date of Encounter: 11/22/2017  Primary Cardiologist: Allene Dillon   Subjective   57 year old gentleman with a history of known three-vessel coronary artery disease, ischemic cardia myopathy with an ejection fraction of 25 to 30%. He had a heart catheterization last year and has known three-vessel disease.  He is been seen at [facility] for evaluation of a high risk coronary artery bypass grafting.  Overexerted himself several days ago and was admitted with chest discomfort. He had a mild troponin bump which peaked at 2.32.  His troponin this morning is 1.67.  Still smokes 1 ppd    Inpatient Medications    Scheduled Meds: . aspirin EC  81 mg Oral Daily  . atorvastatin  80 mg Oral Daily  . carvedilol  6.25 mg Oral BID WC  . clopidogrel  75 mg Oral Daily  . gabapentin  800 mg Oral TID  . insulin aspart  0-9 Units Subcutaneous TID AC & HS   Continuous Infusions: . heparin 1,700 Units/hr (11/21/17 2002)   PRN Meds: acetaminophen **OR** acetaminophen, ipratropium-albuterol, morphine injection, ondansetron **OR** ondansetron (ZOFRAN) IV, traZODone   Vital Signs    Vitals:   11/21/17 2031 11/22/17 0354 11/22/17 0500 11/22/17 0740  BP: 119/67 134/77  140/79  Pulse: 89 82  77  Resp: 17 17    Temp: 98.7 F (37.1 C) 98.1 F (36.7 C)  98.4 F (36.9 C)  TempSrc: Oral Oral  Oral  SpO2: 95% 96%  93%  Weight:   228 lb 9.6 oz (103.7 kg)   Height:        Intake/Output Summary (Last 24 hours) at 11/22/2017 1103 Last data filed at 11/22/2017 1041 Gross per 24 hour  Intake 743.69 ml  Output 1000 ml  Net -256.31 ml   Filed Weights   11/21/17 0430 11/21/17 1307 11/22/17 0500  Weight: 231 lb 11.3 oz (105.1 kg) 229 lb 14.4 oz (104.3 kg) 228 lb 9.6 oz (103.7 kg)    Telemetry    NSR  At 93  - Personally Reviewed  ECG       Physical Exam    GEN:  middle age male, NAD  Neck: No JVD Cardiac: RRR  Respiratory: Clear to  auscultation bilaterally. GI: Soft, nontender, non-distended , moderately obese  MS: No edema;  Non healing ulcer on bottom of right foot  Neuro:  Nonfocal  Psych: Normal affect   Labs    Chemistry Recent Labs  Lab 11/21/17 0115 11/21/17 0251  NA 133*  --   K 4.0  --   CL 96*  --   CO2 26  --   GLUCOSE 189*  --   BUN 16  --   CREATININE 1.09  --   CALCIUM 9.0  --   PROT  --  7.1  ALBUMIN  --  3.4*  AST  --  23  ALT  --  12*  ALKPHOS  --  93  BILITOT  --  0.7  GFRNONAA >60  --   GFRAA >60  --   ANIONGAP 11  --      Hematology Recent Labs  Lab 11/21/17 0115 11/22/17 0209  WBC 10.8* 9.8  RBC 4.97 5.05  HGB 12.9* 12.9*  HCT 37.4* 38.2*  MCV 75.4* 75.6*  MCH 25.9* 25.6*  MCHC 34.4 33.8  RDW 23.6* 23.8*  PLT 282 277    Cardiac Enzymes Recent Labs  Lab 11/21/17 1249 11/21/17 1908 11/22/17 0209 11/22/17  0754  TROPONINI 1.95* 2.32* 2.32* 1.67*   No results for input(s): TROPIPOC in the last 168 hours.   BNP Recent Labs  Lab 11/21/17 0115  BNP 206.0*     DDimer No results for input(s): DDIMER in the last 168 hours.   Radiology    Dg Chest 2 View  Result Date: 11/21/2017 CLINICAL DATA:  Chest pain and shortness of breath. EXAM: CHEST - 2 VIEW COMPARISON:  Most recent radiographs and CT 04/22/2017 FINDINGS: Borderline cardiomegaly, similar to prior exam. There is atherosclerosis of the aortic arch. Central peribronchial thickening without evidence of superimposed edema. No focal airspace disease, pleural effusion or pneumothorax. Chronic deformity of the distal right clavicle. IMPRESSION: Borderline cardiomegaly. Chronic central bronchial thickening. Resolved pulmonary edema and pleural effusions from prior exam Electronically Signed   By: Jeb Levering M.D.   On: 11/21/2017 01:24    Cardiac Studies      Patient Profile     57 y.o. male  With known CAD and PVD   Assessment & Plan    1.  Coronary artery disease: Patient has known three-vessel  coronary artery disease.  He was admitted with episodes of chest discomfort several days ago after over exerting himself.  He ruled in for a small NSTEMI  Feeling much better.  He is currently on IV heparin.  We will discontinue the heparin.  I would prefer him to stay today and ambulate.  We will need to make sure the remained stable off the heparin.  Currently being evaluated at [facility] for bypass surgery.  He has a nonhealing ulcer on his right foot.  There is discussion about needing to amputate his foot versus perform vascular bypass before doing coronary artery bypass.  There are plans and management per [employer] Advised the patient to stop smoking. Anticipate discharge tomorrow if he does well. .  For questions or updates, please contact [place] Please consult [email or web address] for contact info under Cardiology/STEMI.      Signed, Mertie Moores, MD  11/22/2017, 11:03 AM

## 2017-11-22 NOTE — Progress Notes (Signed)
ANTICOAGULATION CONSULT NOTE - Initial Consult  Pharmacy Consult for heparin drip Indication: chest pain/ACS  Allergies  Allergen Reactions  . Dulaglutide Anaphylaxis, Diarrhea and Hives    Patient Measurements: Height: 5\' 7"  (170.2 cm) Weight: 228 lb 9.6 oz (103.7 kg) IBW/kg (Calculated) : 66.1 Heparin Dosing Weight: 90 kg  Vital Signs: Temp: 98.4 F (36.9 C) (06/15 0740) Temp Source: Oral (06/15 0740) BP: 140/79 (06/15 0740) Pulse Rate: 77 (06/15 0740)  Labs: Recent Labs    11/21/17 0115 11/21/17 0251  11/21/17 1908 11/22/17 0209 11/22/17 0754  HGB 12.9*  --   --   --  12.9*  --   HCT 37.4*  --   --   --  38.2*  --   PLT 282  --   --   --  277  --   APTT  --  34  --   --   --   --   LABPROT  --  13.7  --   --   --   --   INR  --  1.06  --   --   --   --   HEPARINUNFRC  --   --    < > 0.27* 0.54 0.47  CREATININE 1.09  --   --   --   --   --   CKTOTAL  --  202  --   --   --   --   TROPONINI 0.10*  --    < > 2.32* 2.32* 1.67*   < > = values in this interval not displayed.    Estimated Creatinine Clearance: 87.8 mL/min (by C-G formula based on SCr of 1.09 mg/dL).   Medical History: Past Medical History:  Diagnosis Date  . Arthritis   . Asthma   . Atherosclerosis   . BPH (benign prostatic hyperplasia)   . Carotid arterial disease ([place])   . Charcot's joint of foot, right   . Chronic combined systolic (congestive) and diastolic (congestive) heart failure ([place])    a. 02/2013 EF 50% by LV gram; b. 04/2017 Echo: EF 25-30%. diff HK. Gr2 DD. Mod dil LA/RV. PASP 37mmHg.  [name] COPD (chronic obstructive pulmonary disease) ([place])   . Coronary artery disease    a. 2013 S/P PCI of LAD [facility]);  b. 03/2013 PCI: RCA 90p ( 2.5 x 23 mm DES); c. 02/2014 Cath: patent RCA stent->Med Rx; d. 04/2017 Cath: LM nl, LAd 30ost/p, 24m/d, D1 80ost, LCX 90p/m, OM1 85, RCA 70/20p, 63m, 70d->Referred for CT Surg-felt to be poor candidate.  . Diabetic neuropathy ([place])   . Diabetic ulcer of right  foot ([place])   . Hernia   . Hyperlipidemia   . Hypertension   . Ischemic cardiomyopathy    a. 04/2017 Echo: EF 25-30%; b. 08/2017 Cardiac MRI ([facility]): EF 19%, sev glob HK. RVEF 20%, mod BAE, triv MR, mild-mod TR. Basal lateral subendocardial infarct (viable), inf/infsept, dist septal ischemia, basal to mid lat peri-infarct ischemia.  . Morbid obesity ([place])   . Neuropathy   . PAD (peripheral artery disease) ([place])    a. Followed by Dr. Lucky Cowboy; b. 08/2010 Periph Angio: RSFA 70-80p (6X60 self-expanding stent); c. 09/2010 Periph Angio: L SFA 100p (7x unknown length self-expanding stent); d. 06/2015 Periph Angio: R SFA short segment occlusion (6x12 self-expanding stent).  . Restless leg syndrome   . Sleep apnea   . Subclavian artery stenosis, right ([place])   . Syncope and collapse   . Tobacco use  a. 75+ yr hx - still smoking 1ppd, down from 2 ppd.  . Type II diabetes mellitus ([place])   . Varicose vein     Assessment: 57 y/o F with a h/o CAD and CHF admitted for NSTEMI.   Goal of Therapy:  Heparin level 0.3-0.7 units/ml Monitor platelets by anticoagulation protocol: Yes   Plan:  Continue current regimen. Will order HL and CBC for AM.   Napoleon Form, PharmD 11/22/2017,8:59 AM

## 2017-11-22 NOTE — Progress Notes (Signed)
ANTICOAGULATION CONSULT NOTE - Initial Consult  Pharmacy Consult for heparin drip Indication: chest pain/ACS  Allergies  Allergen Reactions  . Dulaglutide Anaphylaxis, Diarrhea and Hives    Patient Measurements: Height: 5\' 7"  (170.2 cm) Weight: 229 lb 14.4 oz (104.3 kg) IBW/kg (Calculated) : 66.1 Heparin Dosing Weight: 90 kg  Vital Signs: Temp: 98.1 F (36.7 C) (06/15 0354) Temp Source: Oral (06/15 0354) BP: 134/77 (06/15 0354) Pulse Rate: 82 (06/15 0354)  Labs: Recent Labs    11/21/17 0115 11/21/17 0251  11/21/17 1053 11/21/17 1249 11/21/17 1908 11/22/17 0209  HGB 12.9*  --   --   --   --   --  12.9*  HCT 37.4*  --   --   --   --   --  38.2*  PLT 282  --   --   --   --   --  277  APTT  --  34  --   --   --   --   --   LABPROT  --  13.7  --   --   --   --   --   INR  --  1.06  --   --   --   --   --   HEPARINUNFRC  --   --   --  0.13*  --  0.27* 0.54  CREATININE 1.09  --   --   --   --   --   --   CKTOTAL  --  202  --   --   --   --   --   TROPONINI 0.10*  --    < >  --  1.95* 2.32* 2.32*   < > = values in this interval not displayed.    Estimated Creatinine Clearance: 88.2 mL/min (by C-G formula based on SCr of 1.09 mg/dL).   Medical History: Past Medical History:  Diagnosis Date  . Arthritis   . Asthma   . Atherosclerosis   . BPH (benign prostatic hyperplasia)   . Carotid arterial disease ([place])   . Charcot's joint of foot, right   . Chronic combined systolic (congestive) and diastolic (congestive) heart failure ([place])    a. 02/2013 EF 50% by LV gram; b. 04/2017 Echo: EF 25-30%. diff HK. Gr2 DD. Mod dil LA/RV. PASP 68mmHg.  [name] COPD (chronic obstructive pulmonary disease) ([place])   . Coronary artery disease    a. 2013 S/P PCI of LAD [facility]);  b. 03/2013 PCI: RCA 90p ( 2.5 x 23 mm DES); c. 02/2014 Cath: patent RCA stent->Med Rx; d. 04/2017 Cath: LM nl, LAd 30ost/p, 31m/d, D1 80ost, LCX 90p/m, OM1 85, RCA 70/20p, 50m, 70d->Referred for CT Surg-felt to be poor  candidate.  . Diabetic neuropathy ([place])   . Diabetic ulcer of right foot ([place])   . Hernia   . Hyperlipidemia   . Hypertension   . Ischemic cardiomyopathy    a. 04/2017 Echo: EF 25-30%; b. 08/2017 Cardiac MRI ([facility]): EF 19%, sev glob HK. RVEF 20%, mod BAE, triv MR, mild-mod TR. Basal lateral subendocardial infarct (viable), inf/infsept, dist septal ischemia, basal to mid lat peri-infarct ischemia.  . Morbid obesity ([place])   . Neuropathy   . PAD (peripheral artery disease) ([place])    a. Followed by Dr. Lucky Cowboy; b. 08/2010 Periph Angio: RSFA 70-80p (6X60 self-expanding stent); c. 09/2010 Periph Angio: L SFA 100p (7x unknown length self-expanding stent); d. 06/2015 Periph Angio: R SFA short segment occlusion (6x12 self-expanding stent).  [name]  Restless leg syndrome   . Sleep apnea   . Subclavian artery stenosis, right ([place])   . Syncope and collapse   . Tobacco use    a. 75+ yr hx - still smoking 1ppd, down from 2 ppd.  . Type II diabetes mellitus ([place])   . Varicose vein     Assessment: 57 y/o F with a h/o CAD and CHF admitted for NSTEMI.   Goal of Therapy:  Heparin level 0.3-0.7 units/ml Monitor platelets by anticoagulation protocol: Yes   Plan:  Heparin level is below goal at 0.27 IU/mL Will bolus heparin 1300 units and inrease infusion to 1700 unit/hr. Will recheck a HL in 6 hours.   6/15 0200 HL 0.54. Continue current regimen. Recheck in 6 hours to confirm.  Venetta Knee S, PharmD 11/22/2017,4:57 AM

## 2017-11-23 LAB — CBC
HCT: 38.3 % — ABNORMAL LOW (ref 40.0–52.0)
Hemoglobin: 13.1 g/dL (ref 13.0–18.0)
MCH: 26.1 pg (ref 26.0–34.0)
MCHC: 34.2 g/dL (ref 32.0–36.0)
MCV: 76.3 fL — ABNORMAL LOW (ref 80.0–100.0)
Platelets: 287 10*3/uL (ref 150–440)
RBC: 5.02 MIL/uL (ref 4.40–5.90)
RDW: 24 % — ABNORMAL HIGH (ref 11.5–14.5)
WBC: 8.3 10*3/uL (ref 3.8–10.6)

## 2017-11-23 LAB — GLUCOSE, CAPILLARY: Glucose-Capillary: 223 mg/dL — ABNORMAL HIGH (ref 65–99)

## 2017-11-23 LAB — HEPARIN LEVEL (UNFRACTIONATED): Heparin Unfractionated: <0.1 IU/mL — ABNORMAL LOW (ref 0.30–0.70)

## 2017-11-23 MED ORDER — NICOTINE 14 MG/24HR TD PT24: 14.0000 mg | MEDICATED_PATCH | Freq: Every day | TRANSDERMAL | 0 refills | Status: DC

## 2017-11-23 NOTE — Discharge Summary (Signed)
[place] [facility] [place] NAME: [name] [name]    MR#:  [id number]  DATE OF BIRTH:  03-27-1961  DATE OF ADMISSION:  11/21/2017 ADMITTING PHYSICIAN: Lance Coon, MD  DATE OF DISCHARGE: No discharge date for patient encounter.  PRIMARY CARE PHYSICIAN: Clarisse Gouge, MD    ADMISSION DIAGNOSIS:  Hyperglycemia [R73.9] NSTEMI (non-ST elevated myocardial infarction) ([place]) [I21.4]  DISCHARGE DIAGNOSIS:  Principal Problem:   NSTEMI (non-ST elevated myocardial infarction) [facility]) Active Problems:   Coronary artery disease   Hyperlipidemia   Hypertension   Chronic systolic CHF (congestive heart failure) ([place])   Diabetes ([place])   SECONDARY DIAGNOSIS:   Past Medical History:  Diagnosis Date  . Arthritis   . Asthma   . Atherosclerosis   . BPH (benign prostatic hyperplasia)   . Carotid arterial disease ([place])   . Charcot's joint of foot, right   . Chronic combined systolic (congestive) and diastolic (congestive) heart failure ([place])    a. 02/2013 EF 50% by LV gram; b. 04/2017 Echo: EF 25-30%. diff HK. Gr2 DD. Mod dil LA/RV. PASP 72mmHg.  [name] COPD (chronic obstructive pulmonary disease) ([place])   . Coronary artery disease    a. 2013 S/P PCI of LAD [facility]);  b. 03/2013 PCI: RCA 90p ( 2.5 x 23 mm DES); c. 02/2014 Cath: patent RCA stent->Med Rx; d. 04/2017 Cath: LM nl, LAd 30ost/p, 34m/d, D1 80ost, LCX 90p/m, OM1 85, RCA 70/20p, 67m, 70d->Referred for CT Surg-felt to be poor candidate.  . Diabetic neuropathy ([place])   . Diabetic ulcer of right foot ([place])   . Hernia   . Hyperlipidemia   . Hypertension   . Ischemic cardiomyopathy    a. 04/2017 Echo: EF 25-30%; b. 08/2017 Cardiac MRI ([facility]): EF 19%, sev glob HK. RVEF 20%, mod BAE, triv MR, mild-mod TR. Basal lateral subendocardial infarct (viable), inf/infsept, dist septal ischemia, basal to mid lat peri-infarct ischemia.  . Morbid obesity ([place])   . Neuropathy   . PAD (peripheral artery disease) ([place])     a. Followed by Dr. Lucky Cowboy; b. 08/2010 Periph Angio: RSFA 70-80p (6X60 self-expanding stent); c. 09/2010 Periph Angio: L SFA 100p (7x unknown length self-expanding stent); d. 06/2015 Periph Angio: R SFA short segment occlusion (6x12 self-expanding stent).  . Restless leg syndrome   . Sleep apnea   . Subclavian artery stenosis, right ([place])   . Syncope and collapse   . Tobacco use    a. 75+ yr hx - still smoking 1ppd, down from 2 ppd.  . Type II diabetes mellitus ([place])   . Varicose vein     HOSPITAL COURSE:  *Acute non-STEMI  Resolved Cardiology did see patient while in house-recommended outpatient follow-up status post discharge here in [place] or at [facility] where he was being evaluated for possible bypass surgery, was treated with heparin drip for 48-hour course, DAPT with aspirin/Plavix, statin therapy, beta-blocker, ACE inhibitor, and patient did well   *Chronic nonhealing right foot wound Patient to follow-up with his podiatrist status post discharge for continued care/management Per cardiology-there was discussion regarding possible amputation of right lower extremity prior to coronary artery grafting/bypass  *Chronic tobacco smoking abuse/dependency Cessation counseling giving, nicotine patch daily while in house  *Chronic benign essential hypertension Stable on current regiment  *Chronic systolic congestive heart failure without exacerbation Stable on current regiment  *Chronic diabetes mellitus type 2 Stable  *Chronic hyperlipidemia, unspecified Continue statin therapy   DISCHARGE CONDITIONS:   stable  CONSULTS OBTAINED:  Treatment Team:  Wellington Hampshire, MD  DRUG ALLERGIES:   Allergies  Allergen Reactions  . Dulaglutide Anaphylaxis, Diarrhea and Hives    DISCHARGE MEDICATIONS:   Allergies as of 11/23/2017      Reactions   Dulaglutide Anaphylaxis, Diarrhea, Hives      Medication List    TAKE these medications   albuterol 108 (90 Base) MCG/ACT  inhaler Commonly known as:  PROVENTIL HFA;VENTOLIN HFA Inhale 2 puffs into the lungs every 6 (six) hours as needed for wheezing or shortness of breath.   aspirin EC 81 MG tablet Take 1 tablet (81 mg total) by mouth daily.   atorvastatin 80 MG tablet Commonly known as:  LIPITOR Take 80 mg by mouth daily.   carvedilol 6.25 MG tablet Commonly known as:  COREG Take 1 tablet by mouth 2 (two) times daily.   clopidogrel 75 MG tablet Commonly known as:  PLAVIX Take 1 tablet by mouth daily.   furosemide 40 MG tablet Commonly known as:  LASIX Take 1 tablet (40 mg total) 2 (two) times daily by mouth. What changed:    when to take this  reasons to take this   gabapentin 800 MG tablet Commonly known as:  NEURONTIN Take 1 tablet by mouth 4 (four) times daily.   insulin lispro 100 UNIT/ML injection Commonly known as:  HUMALOG Inject 0-10 Units into the skin 3 (three) times daily with meals. If BS is 0 to 70 no insulin and eat/glucose tablets/glucose get If BS 71 to 150 great! If BS 151 to 200, 2 Units Humaog If BS 201 to 250, 4 Units Humalog If BS 251 to 300, 6 Units Humalog If BS 301 to 350, 8 Units Humalog If BS 351 to 400, 10 Units Humalog If BS greater than 400, take 10 Units and Contact our office during office hour   ipratropium-albuterol 0.5-2.5 (3) MG/3ML Soln Commonly known as:  DUONEB Inhale 3 mLs into the lungs 3 (three) times daily.   LANTUS SOLOSTAR 100 UNIT/ML Solostar Pen Generic drug:  Insulin Glargine Inject 40-80 Units into the skin 2 (two) times daily as needed. 80 in am; 40 units at night   lisinopril 5 MG tablet Commonly known as:  PRINIVIL,ZESTRIL Take 1 tablet by mouth daily.   metFORMIN 500 MG 24 hr tablet Commonly known as:  GLUCOPHAGE-XR Take 500 mg by mouth daily with breakfast.   nicotine 14 mg/24hr patch Commonly known as:  NICODERM CQ - dosed in mg/24 hours Place 1 patch (14 mg total) onto the skin daily.   nitroGLYCERIN 0.4 MG SL  tablet Commonly known as:  NITROSTAT Place 0.4 mg under the tongue every 5 (five) minutes as needed for chest pain.   nitroGLYCERIN 0.4 mg/hr patch Commonly known as:  NITRODUR - Dosed in mg/24 hr Place 1 patch onto the skin daily. Changes patch every 12 hours   traZODone 50 MG tablet Commonly known as:  DESYREL Take 1 tablet by mouth at bedtime.        DISCHARGE INSTRUCTIONS:  If you experience worsening of your admission symptoms, develop shortness of breath, life threatening emergency, suicidal or homicidal thoughts you must seek medical attention immediately by calling 911 or calling your MD immediately  if symptoms less severe.  You Must read complete instructions/literature along with all the possible adverse reactions/side effects for all the Medicines you take and that have been prescribed to you. Take any new Medicines after you have completely understood and accept all the possible adverse  reactions/side effects.   Please note  You were cared for by a hospitalist during your hospital stay. If you have any questions about your discharge medications or the care you received while you were in the hospital after you are discharged, you can call the unit and asked to speak with the hospitalist on call if the hospitalist that took care of you is not available. Once you are discharged, your primary care physician will handle any further medical issues. Please note that NO REFILLS for any discharge medications will be authorized once you are discharged, as it is imperative that you return to your primary care physician (or establish a relationship with a primary care physician if you do not have one) for your aftercare needs so that they can reassess your need for medications and monitor your lab values.    Today   CHIEF COMPLAINT:   Chief Complaint  Patient presents with  . Chest Pain    HISTORY OF PRESENT ILLNESS:  57 y.o. male who presents with chest pain.  Patient states  that his pain started about 10:30 in the evening, he was outside his house being moderately active at that time.  The pain was acute in onset, centrally located, radiated to his jaw, was associated with significant shortness of breath.  It persisted for couple of hours until he finally came to the ED for evaluation.  Here his troponin is elevated.  He has a history of significant coronary disease, as well as CHF.  He had some EKG changes, most notably in his lateral leads.  He did not have STEMI, hospitalist were called for admission.     VITAL SIGNS:  Blood pressure (!) 147/92, pulse 90, temperature 98.1 F (36.7 C), temperature source Oral, resp. rate 18, height 5\' 7"  (1.702 m), weight 104.5 kg (230 lb 4.8 oz), SpO2 96 %.  I/O:    Intake/Output Summary (Last 24 hours) at 11/23/2017 1108 Last data filed at 11/23/2017 0910 Gross per 24 hour  Intake -  Output 275 ml  Net -275 ml    PHYSICAL EXAMINATION:  GENERAL:  57 y.o.-year-old patient lying in the bed with no acute distress.  EYES: Pupils equal, round, reactive to light and accommodation. No scleral icterus. Extraocular muscles intact.  HEENT: Head atraumatic, normocephalic. Oropharynx and nasopharynx clear.  NECK:  Supple, no jugular venous distention. No thyroid enlargement, no tenderness.  LUNGS: Normal breath sounds bilaterally, no wheezing, rales,rhonchi or crepitation. No use of accessory muscles of respiration.  CARDIOVASCULAR: S1, S2 normal. No murmurs, rubs, or gallops.  ABDOMEN: Soft, non-tender, non-distended. Bowel sounds present. No organomegaly or mass.  EXTREMITIES: No pedal edema, cyanosis, or clubbing.  NEUROLOGIC: Cranial nerves II through XII are intact. Muscle strength 5/5 in all extremities. Sensation intact. Gait not checked.  PSYCHIATRIC: The patient is alert and oriented x 3.  SKIN: No obvious rash, lesion, or ulcer.   DATA REVIEW:   CBC Recent Labs  Lab 11/23/17 0610  WBC 8.3  HGB 13.1  HCT 38.3*   PLT 287    Chemistries  Recent Labs  Lab 11/21/17 0115 11/21/17 0251  NA 133*  --   K 4.0  --   CL 96*  --   CO2 26  --   GLUCOSE 189*  --   BUN 16  --   CREATININE 1.09  --   CALCIUM 9.0  --   MG  --  1.7  AST  --  23  ALT  --  12*  ALKPHOS  --  33  BILITOT  --  0.7    Cardiac Enzymes Recent Labs  Lab 11/22/17 1301  TROPONINI 1.27*    Microbiology Results  Results for orders placed or performed during the hospital encounter of 11/21/17  MRSA PCR Screening     Status: None   Collection Time: 11/21/17  4:16 AM  Result Value Ref Range Status   MRSA by PCR NEGATIVE NEGATIVE Final    Comment:        The GeneXpert MRSA Assay (FDA approved for NASAL specimens only), is one component of a comprehensive MRSA colonization surveillance program. It is not intended to diagnose MRSA infection nor to guide or monitor treatment for MRSA infections. Performed at [facility], [address], [place], [place] [address]     RADIOLOGY:  No results found.  EKG:   Orders placed or performed during the hospital encounter of 11/21/17  . ED EKG within 10 minutes  . ED EKG within 10 minutes  . EKG 12-Lead  . EKG 12-Lead      Management plans discussed with the patient, family and they are in agreement.  CODE STATUS:     Code Status Orders  (From admission, onward)        Start     Ordered   11/21/17 0415  Full code  Continuous     11/21/17 0414    Code Status History    Date Active Date Inactive Code Status Order ID Comments User Context   04/23/2017 0355 04/29/2017 1353 Full Code [id number]  Saundra Shelling, MD Inpatient   07/11/2016 0324 07/11/2016 1851 Full Code [id number]  Saundra Shelling, MD Inpatient   05/06/2016 0005 05/07/2016 1417 Full Code [id number]  Harvie Bridge, DO Inpatient   11/21/2015 1716 11/22/2015 1950 Full Code [id number]  Gladstone Lighter, MD Inpatient   07/06/2015 0948 07/06/2015 1437 Full Code [id number]  Algernon Huxley, MD  Inpatient      TOTAL TIME TAKING CARE OF THIS PATIENT: 45 minutes.    Avel Peace Calhoun Reichardt M.D on 11/23/2017 at 11:08 AM  Between 7am to 6pm - Pager - [phone]  After 6pm go to [email or web address] - password [email or web address] [facility]  Office  [phone]  CC: Primary care physician; Clarisse Gouge, MD   Note: This dictation was prepared with Dragon dictation along with smaller phrase technology. Any transcriptional errors that result from this process are unintentional.

## 2017-11-23 NOTE — Progress Notes (Signed)
Patient left by private vehicle with his girl friend.  RN removed IV.  Phillis Knack, RN

## 2017-11-23 NOTE — Progress Notes (Signed)
Patient's foot had a bad smell.  RN removed dressing and it had brown drainage.  RN cleaned wound with normal saline.  Patient has had the same wound for 2 years.  New dressing was applied with the special silver insert and a heel foam wrapped over it.  The wound is on his right foot in the middle of the bottom of the foot.  RN did teaching and suggested more frequent wound care.  Patient was receptive.  Phillis Knack, RN

## 2017-11-23 NOTE — Progress Notes (Signed)
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 He had a heart catheterization last year and has known three-vessel disease.  He is been seen at [facility] for evaluation of a high risk coronary artery bypass grafting.  Overexerted himself several days ago and was admitted with chest discomfort. He had a mild troponin bump which peaked at 2.32.     He is off heparin.  He is ambulated without difficulty.  Has not had any further episodes of chest pain.  He has a nicotine patch on his own.   Inpatient Medications    Scheduled Meds: . aspirin EC  81 mg Oral Daily  . atorvastatin  80 mg Oral Daily  . carvedilol  6.25 mg Oral BID WC  . clopidogrel  75 mg Oral Daily  . gabapentin  800 mg Oral TID  . insulin aspart  0-9 Units Subcutaneous TID AC & HS  . nicotine  14 mg Transdermal Daily   Continuous Infusions:  PRN Meds: acetaminophen **OR** acetaminophen, ipratropium-albuterol, morphine injection, ondansetron **OR** ondansetron (ZOFRAN) IV, traZODone   Vital Signs    Vitals:   11/22/17 1941 11/23/17 0411 11/23/17 0414 11/23/17 0740  BP: 116/66 130/75  (!) 147/92  Pulse: 92 89  90  Resp: 18 18    Temp: 98.4 F (36.9 C) 98.4 F (36.9 C)  98.1 F (36.7 C)  TempSrc: Oral Oral  Oral  SpO2: 94% 95%  96%  Weight:   230 lb 4.8 oz (104.5 kg)   Height:        Intake/Output Summary (Last 24 hours) at 11/23/2017 0957 Last data filed at 11/23/2017 0910 Gross per 24 hour  Intake 240 ml  Output 275 ml  Net -35 ml   Filed Weights   11/21/17 1307 11/22/17 0500 11/23/17 0414  Weight: 229 lb 14.4 oz (104.3 kg) 228 lb 9.6 oz (103.7 kg) 230 lb 4.8 oz (104.5 kg)    Telemetry    NSR  At 93  - Personally Reviewed  ECG       Physical Exam    Physical  Exam: Blood pressure (!) 147/92, pulse 90, temperature 98.1 F (36.7 C), temperature source Oral, resp. rate 18, height 5\' 7"  (1.702 m), weight 230 lb 4.8 oz (104.5 kg), SpO2 96 %.  GEN:   Middle age male,   NAD  HEENT: Normal NECK: No JVD; No carotid bruits LYMPHATICS: No lymphadenopathy CARDIAC:  RR  RESPIRATORY:  Clear to auscultation without rales, wheezing or rhonchi  ABDOMEN: Soft, non-tender, non-distended MUSCULOSKELETAL:  No edema; No deformity  SKIN: Warm and dry NEUROLOGIC:  Alert and oriented x 3  Labs    Chemistry Recent Labs  Lab 11/21/17 0115 11/21/17 0251  NA 133*  --   K 4.0  --   CL 96*  --   CO2 26  --   GLUCOSE 189*  --   BUN 16  --   CREATININE 1.09  --   CALCIUM 9.0  --   PROT  --  7.1  ALBUMIN  --  3.4*  AST  --  23  ALT  --  12*  ALKPHOS  --  93  BILITOT  --  0.7  GFRNONAA >60  --   GFRAA >60  --   ANIONGAP 11  --  Hematology Recent Labs  Lab 11/21/17 0115 11/22/17 0209 11/23/17 0610  WBC 10.8* 9.8 8.3  RBC 4.97 5.05 5.02  HGB 12.9* 12.9* 13.1  HCT 37.4* 38.2* 38.3*  MCV 75.4* 75.6* 76.3*  MCH 25.9* 25.6* 26.1  MCHC 34.4 33.8 34.2  RDW 23.6* 23.8* 24.0*  PLT 282 277 287    Cardiac Enzymes Recent Labs  Lab 11/21/17 1908 11/22/17 0209 11/22/17 0754 11/22/17 1301  TROPONINI 2.32* 2.32* 1.67* 1.27*   No results for input(s): TROPIPOC in the last 168 hours.   BNP Recent Labs  Lab 11/21/17 0115  BNP 206.0*     DDimer No results for input(s): DDIMER in the last 168 hours.   Radiology    No results found.  Cardiac Studies      Patient Profile     57 y.o. male  With known CAD and PVD   Assessment & Plan    1.  Coronary artery disease:  Has been evaluated at [facility].  He may want to start seeing cardiology again back in [place].  We will leave that decision up to him. Appears to be very stable.  He is not having any angina.  I advised him to stop smoking.  We will discharge him on  his current medications.  He will follow-up with [place] in [place] or at [facility].  .  For questions or updates, please contact [facility] Please consult [email or web address] for contact info under Cardiology/STEMI.      Signed, Mertie Moores, MD  11/23/2017, 9:57 AM

## 2017-12-18 ENCOUNTER — Encounter: Payer: Self-pay | Admitting: Pharmacist

## 2017-12-21 ENCOUNTER — Emergency Department: Payer: Medicaid Other

## 2017-12-21 DIAGNOSIS — E785 Hyperlipidemia, unspecified: Secondary | ICD-10-CM | POA: Diagnosis present

## 2017-12-21 DIAGNOSIS — Z716 Tobacco abuse counseling: Secondary | ICD-10-CM

## 2017-12-21 DIAGNOSIS — I708 Atherosclerosis of other arteries: Secondary | ICD-10-CM | POA: Diagnosis present

## 2017-12-21 DIAGNOSIS — G4730 Sleep apnea, unspecified: Secondary | ICD-10-CM | POA: Diagnosis present

## 2017-12-21 DIAGNOSIS — Z888 Allergy status to other drugs, medicaments and biological substances status: Secondary | ICD-10-CM

## 2017-12-21 DIAGNOSIS — Z794 Long term (current) use of insulin: Secondary | ICD-10-CM

## 2017-12-21 DIAGNOSIS — E871 Hypo-osmolality and hyponatremia: Secondary | ICD-10-CM | POA: Diagnosis present

## 2017-12-21 DIAGNOSIS — I255 Ischemic cardiomyopathy: Secondary | ICD-10-CM | POA: Diagnosis present

## 2017-12-21 DIAGNOSIS — N400 Enlarged prostate without lower urinary tract symptoms: Secondary | ICD-10-CM | POA: Diagnosis present

## 2017-12-21 DIAGNOSIS — Z7982 Long term (current) use of aspirin: Secondary | ICD-10-CM

## 2017-12-21 DIAGNOSIS — F17210 Nicotine dependence, cigarettes, uncomplicated: Secondary | ICD-10-CM | POA: Diagnosis present

## 2017-12-21 DIAGNOSIS — I8390 Asymptomatic varicose veins of unspecified lower extremity: Secondary | ICD-10-CM | POA: Diagnosis present

## 2017-12-21 DIAGNOSIS — M1990 Unspecified osteoarthritis, unspecified site: Secondary | ICD-10-CM | POA: Diagnosis present

## 2017-12-21 DIAGNOSIS — A419 Sepsis, unspecified organism: Principal | ICD-10-CM | POA: Diagnosis present

## 2017-12-21 DIAGNOSIS — I110 Hypertensive heart disease with heart failure: Secondary | ICD-10-CM | POA: Diagnosis present

## 2017-12-21 DIAGNOSIS — G2581 Restless legs syndrome: Secondary | ICD-10-CM | POA: Diagnosis present

## 2017-12-21 DIAGNOSIS — J440 Chronic obstructive pulmonary disease with acute lower respiratory infection: Secondary | ICD-10-CM | POA: Diagnosis present

## 2017-12-21 DIAGNOSIS — L97513 Non-pressure chronic ulcer of other part of right foot with necrosis of muscle: Secondary | ICD-10-CM | POA: Diagnosis present

## 2017-12-21 DIAGNOSIS — I5042 Chronic combined systolic (congestive) and diastolic (congestive) heart failure: Secondary | ICD-10-CM | POA: Diagnosis present

## 2017-12-21 DIAGNOSIS — J9601 Acute respiratory failure with hypoxia: Secondary | ICD-10-CM | POA: Diagnosis present

## 2017-12-21 DIAGNOSIS — E875 Hyperkalemia: Secondary | ICD-10-CM | POA: Diagnosis present

## 2017-12-21 DIAGNOSIS — I252 Old myocardial infarction: Secondary | ICD-10-CM

## 2017-12-21 DIAGNOSIS — J189 Pneumonia, unspecified organism: Secondary | ICD-10-CM | POA: Diagnosis present

## 2017-12-21 DIAGNOSIS — Z79899 Other long term (current) drug therapy: Secondary | ICD-10-CM

## 2017-12-21 DIAGNOSIS — I2510 Atherosclerotic heart disease of native coronary artery without angina pectoris: Secondary | ICD-10-CM | POA: Diagnosis present

## 2017-12-21 DIAGNOSIS — Z8349 Family history of other endocrine, nutritional and metabolic diseases: Secondary | ICD-10-CM

## 2017-12-21 DIAGNOSIS — Z6836 Body mass index (BMI) 36.0-36.9, adult: Secondary | ICD-10-CM

## 2017-12-21 DIAGNOSIS — Z79891 Long term (current) use of opiate analgesic: Secondary | ICD-10-CM

## 2017-12-21 DIAGNOSIS — E11621 Type 2 diabetes mellitus with foot ulcer: Secondary | ICD-10-CM | POA: Diagnosis present

## 2017-12-21 DIAGNOSIS — Z8249 Family history of ischemic heart disease and other diseases of the circulatory system: Secondary | ICD-10-CM

## 2017-12-21 DIAGNOSIS — J441 Chronic obstructive pulmonary disease with (acute) exacerbation: Secondary | ICD-10-CM | POA: Diagnosis present

## 2017-12-21 DIAGNOSIS — Z955 Presence of coronary angioplasty implant and graft: Secondary | ICD-10-CM

## 2017-12-21 DIAGNOSIS — E1151 Type 2 diabetes mellitus with diabetic peripheral angiopathy without gangrene: Secondary | ICD-10-CM | POA: Diagnosis present

## 2017-12-21 DIAGNOSIS — E1142 Type 2 diabetes mellitus with diabetic polyneuropathy: Secondary | ICD-10-CM | POA: Diagnosis present

## 2017-12-21 LAB — CBC WITH DIFFERENTIAL/PLATELET
Basophils Absolute: 0 10*3/uL (ref 0–0.1)
Basophils Relative: 0 %
Eosinophils Absolute: 0 10*3/uL (ref 0–0.7)
Eosinophils Relative: 0 %
HCT: 34 % — ABNORMAL LOW (ref 40.0–52.0)
Hemoglobin: 11.9 g/dL — ABNORMAL LOW (ref 13.0–18.0)
Lymphocytes Relative: 6 %
Lymphs Abs: 0.8 10*3/uL — ABNORMAL LOW (ref 1.0–3.6)
MCH: 28.1 pg (ref 26.0–34.0)
MCHC: 35.1 g/dL (ref 32.0–36.0)
MCV: 80.2 fL (ref 80.0–100.0)
Monocytes Absolute: 0.6 10*3/uL (ref 0.2–1.0)
Monocytes Relative: 4 %
Neutro Abs: 11.5 10*3/uL — ABNORMAL HIGH (ref 1.4–6.5)
Neutrophils Relative %: 90 %
Platelets: 271 10*3/uL (ref 150–440)
RBC: 4.24 MIL/uL — ABNORMAL LOW (ref 4.40–5.90)
RDW: 21.4 % — ABNORMAL HIGH (ref 11.5–14.5)
WBC: 12.9 10*3/uL — ABNORMAL HIGH (ref 3.8–10.6)

## 2017-12-21 LAB — COMPREHENSIVE METABOLIC PANEL
ALT: 12 U/L (ref 0–44)
AST: 19 U/L (ref 15–41)
Albumin: 3.5 g/dL (ref 3.5–5.0)
Alkaline Phosphatase: 86 U/L (ref 38–126)
Anion gap: 8 (ref 5–15)
BUN: 16 mg/dL (ref 6–20)
CO2: 28 mmol/L (ref 22–32)
Calcium: 9.1 mg/dL (ref 8.9–10.3)
Chloride: 94 mmol/L — ABNORMAL LOW (ref 98–111)
Creatinine, Ser: 1.09 mg/dL (ref 0.61–1.24)
GFR calc Af Amer: >60 mL/min (ref >60)
GFR calc non Af Amer: >60 mL/min (ref >60)
Glucose, Bld: 206 mg/dL — ABNORMAL HIGH (ref 70–99)
Potassium: 4.8 mmol/L (ref 3.5–5.1)
Sodium: 130 mmol/L — ABNORMAL LOW (ref 135–145)
Total Bilirubin: 1.1 mg/dL (ref 0.3–1.2)
Total Protein: 7.6 g/dL (ref 6.5–8.1)

## 2017-12-21 LAB — LACTIC ACID, PLASMA: Lactic Acid, Venous: 1.8 mmol/L (ref 0.5–1.9)

## 2017-12-21 MED ORDER — ACETAMINOPHEN 325 MG PO TABS
ORAL_TABLET | ORAL | Status: AC
  Filled 2017-12-21: qty 1

## 2017-12-21 MED ORDER — ACETAMINOPHEN 325 MG PO TABS: 650.0000 mg | ORAL_TABLET | Freq: Once | ORAL | Status: DC | PRN

## 2017-12-21 MED ORDER — SODIUM CHLORIDE 0.9 % IV BOLUS
1000.0000 mL | Freq: Once | INTRAVENOUS | Status: AC
Start: 2017-12-21 — End: 2017-12-22
  Administered 2017-12-21: 1000 mL via INTRAVENOUS

## 2017-12-21 NOTE — ED Triage Notes (Signed)
Patient reports upper chest pain (points to base of neck) all day that has gotten worse over the days.

## 2017-12-21 NOTE — ED Notes (Signed)
Charge RN made aware of patient presentation.  Pt waiting in family wait room until room available.  Will continue to monitor.

## 2017-12-22 ENCOUNTER — Emergency Department: Payer: Medicaid Other

## 2017-12-22 ENCOUNTER — Inpatient Hospital Stay
Admission: EM | Admit: 2017-12-22 | Discharge: 2017-12-23 | DRG: 871 | Disposition: A | Discharge status: Home / Self Care | Payer: Medicaid Other | Attending: Internal Medicine | Admitting: Internal Medicine

## 2017-12-22 ENCOUNTER — Other Ambulatory Visit: Payer: Self-pay

## 2017-12-22 DIAGNOSIS — N400 Enlarged prostate without lower urinary tract symptoms: Secondary | ICD-10-CM | POA: Diagnosis present

## 2017-12-22 DIAGNOSIS — L97513 Non-pressure chronic ulcer of other part of right foot with necrosis of muscle: Secondary | ICD-10-CM | POA: Diagnosis present

## 2017-12-22 DIAGNOSIS — J189 Pneumonia, unspecified organism: Secondary | ICD-10-CM | POA: Diagnosis present

## 2017-12-22 DIAGNOSIS — A419 Sepsis, unspecified organism: Secondary | ICD-10-CM

## 2017-12-22 DIAGNOSIS — I255 Ischemic cardiomyopathy: Secondary | ICD-10-CM | POA: Diagnosis present

## 2017-12-22 DIAGNOSIS — I2510 Atherosclerotic heart disease of native coronary artery without angina pectoris: Secondary | ICD-10-CM | POA: Diagnosis present

## 2017-12-22 DIAGNOSIS — J9601 Acute respiratory failure with hypoxia: Secondary | ICD-10-CM | POA: Diagnosis present

## 2017-12-22 DIAGNOSIS — I110 Hypertensive heart disease with heart failure: Secondary | ICD-10-CM | POA: Diagnosis present

## 2017-12-22 DIAGNOSIS — Z6836 Body mass index (BMI) 36.0-36.9, adult: Secondary | ICD-10-CM | POA: Diagnosis not present

## 2017-12-22 DIAGNOSIS — G2581 Restless legs syndrome: Secondary | ICD-10-CM | POA: Diagnosis present

## 2017-12-22 DIAGNOSIS — J441 Chronic obstructive pulmonary disease with (acute) exacerbation: Secondary | ICD-10-CM | POA: Diagnosis present

## 2017-12-22 DIAGNOSIS — E1142 Type 2 diabetes mellitus with diabetic polyneuropathy: Secondary | ICD-10-CM | POA: Diagnosis present

## 2017-12-22 DIAGNOSIS — I5042 Chronic combined systolic (congestive) and diastolic (congestive) heart failure: Secondary | ICD-10-CM | POA: Diagnosis present

## 2017-12-22 DIAGNOSIS — J440 Chronic obstructive pulmonary disease with acute lower respiratory infection: Secondary | ICD-10-CM | POA: Diagnosis present

## 2017-12-22 DIAGNOSIS — E1151 Type 2 diabetes mellitus with diabetic peripheral angiopathy without gangrene: Secondary | ICD-10-CM | POA: Diagnosis present

## 2017-12-22 DIAGNOSIS — E785 Hyperlipidemia, unspecified: Secondary | ICD-10-CM | POA: Diagnosis present

## 2017-12-22 DIAGNOSIS — E11621 Type 2 diabetes mellitus with foot ulcer: Secondary | ICD-10-CM | POA: Diagnosis present

## 2017-12-22 DIAGNOSIS — M1990 Unspecified osteoarthritis, unspecified site: Secondary | ICD-10-CM | POA: Diagnosis present

## 2017-12-22 DIAGNOSIS — E875 Hyperkalemia: Secondary | ICD-10-CM | POA: Diagnosis present

## 2017-12-22 DIAGNOSIS — E871 Hypo-osmolality and hyponatremia: Secondary | ICD-10-CM | POA: Diagnosis present

## 2017-12-22 DIAGNOSIS — Z716 Tobacco abuse counseling: Secondary | ICD-10-CM | POA: Diagnosis not present

## 2017-12-22 DIAGNOSIS — Z955 Presence of coronary angioplasty implant and graft: Secondary | ICD-10-CM | POA: Diagnosis not present

## 2017-12-22 DIAGNOSIS — I252 Old myocardial infarction: Secondary | ICD-10-CM | POA: Diagnosis not present

## 2017-12-22 LAB — URINALYSIS, COMPLETE (UACMP) WITH MICROSCOPIC
Bacteria, UA: NONE SEEN
Bilirubin Urine: NEGATIVE
Glucose, UA: NEGATIVE mg/dL
Ketones, ur: NEGATIVE mg/dL
Leukocytes, UA: NEGATIVE
Nitrite: NEGATIVE
Protein, ur: NEGATIVE mg/dL
Specific Gravity, Urine: 1.006 (ref 1.005–1.030)
pH: 6 (ref 5.0–8.0)

## 2017-12-22 LAB — GLUCOSE, CAPILLARY
Glucose-Capillary: 146 mg/dL — ABNORMAL HIGH (ref 70–99)
Glucose-Capillary: 175 mg/dL — ABNORMAL HIGH (ref 70–99)
Glucose-Capillary: 134 mg/dL — ABNORMAL HIGH (ref 70–99)
Glucose-Capillary: 350 mg/dL — ABNORMAL HIGH (ref 70–99)

## 2017-12-22 LAB — LACTIC ACID, PLASMA: Lactic Acid, Venous: 1.2 mmol/L (ref 0.5–1.9)

## 2017-12-22 LAB — TSH: TSH: 0.533 u[IU]/mL (ref 0.350–4.500)

## 2017-12-22 LAB — PROTIME-INR
INR: 1.06
Prothrombin Time: 13.7 seconds (ref 11.4–15.2)

## 2017-12-22 MED ORDER — VANCOMYCIN HCL IN DEXTROSE 1-5 GM/200ML-% IV SOLN
1000.0000 mg | Freq: Once | INTRAVENOUS | Status: AC
  Administered 2017-12-22: 1000 mg via INTRAVENOUS
  Filled 2017-12-22: qty 200

## 2017-12-22 MED ORDER — ACETAMINOPHEN 650 MG RE SUPP: 650.0000 mg | Freq: Four times a day (QID) | RECTAL | Status: DC | PRN

## 2017-12-22 MED ORDER — INSULIN GLARGINE 100 UNIT/ML ~~LOC~~ SOLN
20.0000 [IU] | Freq: Once | SUBCUTANEOUS | Status: AC
  Administered 2017-12-22: 20 [IU] via SUBCUTANEOUS
  Filled 2017-12-22: qty 0.2

## 2017-12-22 MED ORDER — GUAIFENESIN 100 MG/5ML PO SOLN
5.0000 mL | ORAL | Status: DC | PRN
  Administered 2017-12-23: 100 mg via ORAL
  Filled 2017-12-22 (×2): qty 5

## 2017-12-22 MED ORDER — DOCUSATE SODIUM 100 MG PO CAPS
100.0000 mg | ORAL_CAPSULE | Freq: Two times a day (BID) | ORAL | Status: DC
  Administered 2017-12-22 – 2017-12-23 (×3): 100 mg via ORAL
  Filled 2017-12-22 (×3): qty 1

## 2017-12-22 MED ORDER — ACETAMINOPHEN 325 MG PO TABS: 650.0000 mg | ORAL_TABLET | Freq: Four times a day (QID) | ORAL | Status: DC | PRN

## 2017-12-22 MED ORDER — ENOXAPARIN SODIUM 40 MG/0.4ML ~~LOC~~ SOLN
40.0000 mg | SUBCUTANEOUS | Status: DC
  Administered 2017-12-22: 40 mg via SUBCUTANEOUS
  Filled 2017-12-22: qty 0.4

## 2017-12-22 MED ORDER — METHYLPREDNISOLONE SODIUM SUCC 40 MG IJ SOLR
40.0000 mg | Freq: Every day | INTRAMUSCULAR | Status: DC
  Administered 2017-12-22 – 2017-12-23 (×2): 40 mg via INTRAVENOUS
  Filled 2017-12-22 (×2): qty 1

## 2017-12-22 MED ORDER — VANCOMYCIN HCL IN DEXTROSE 1-5 GM/200ML-% IV SOLN
1000.0000 mg | Freq: Two times a day (BID) | INTRAVENOUS | Status: DC
  Administered 2017-12-22 – 2017-12-23 (×3): 1000 mg via INTRAVENOUS
  Filled 2017-12-22 (×5): qty 200

## 2017-12-22 MED ORDER — FUROSEMIDE 40 MG PO TABS
40.0000 mg | ORAL_TABLET | Freq: Every day | ORAL | Status: DC | PRN
Start: 2017-12-22 — End: 2017-12-23

## 2017-12-22 MED ORDER — NITROGLYCERIN 0.4 MG SL SUBL: 0.4000 mg | SUBLINGUAL_TABLET | SUBLINGUAL | Status: DC | PRN

## 2017-12-22 MED ORDER — ONDANSETRON HCL 4 MG PO TABS: 4.0000 mg | ORAL_TABLET | Freq: Four times a day (QID) | ORAL | Status: DC | PRN

## 2017-12-22 MED ORDER — PIPERACILLIN-TAZOBACTAM 3.375 G IVPB
3.3750 g | Freq: Three times a day (TID) | INTRAVENOUS | Status: DC
  Administered 2017-12-22 – 2017-12-23 (×4): 3.375 g via INTRAVENOUS
  Filled 2017-12-22 (×4): qty 50

## 2017-12-22 MED ORDER — CLOPIDOGREL BISULFATE 75 MG PO TABS
75.0000 mg | ORAL_TABLET | Freq: Every day | ORAL | Status: DC
  Administered 2017-12-22 – 2017-12-23 (×2): 75 mg via ORAL
  Filled 2017-12-22 (×2): qty 1

## 2017-12-22 MED ORDER — ONDANSETRON HCL 4 MG/2ML IJ SOLN: 4.0000 mg | Freq: Four times a day (QID) | INTRAMUSCULAR | Status: DC | PRN

## 2017-12-22 MED ORDER — SODIUM CHLORIDE 0.9 % IV SOLN
INTRAVENOUS | Status: DC
  Administered 2017-12-22: 20:00:00 via INTRAVENOUS
  Administered 2017-12-22: 125 mL/h via INTRAVENOUS
  Administered 2017-12-23: 09:00:00 via INTRAVENOUS

## 2017-12-22 MED ORDER — NITROGLYCERIN 0.4 MG/HR TD PT24
0.4000 mg | MEDICATED_PATCH | Freq: Every day | TRANSDERMAL | Status: DC
  Administered 2017-12-22 – 2017-12-23 (×2): 0.4 mg via TRANSDERMAL
  Filled 2017-12-22 (×2): qty 1

## 2017-12-22 MED ORDER — PIPERACILLIN-TAZOBACTAM 3.375 G IVPB 30 MIN
3.3750 g | Freq: Once | INTRAVENOUS | Status: AC
  Administered 2017-12-22: 3.375 g via INTRAVENOUS
  Filled 2017-12-22: qty 50

## 2017-12-22 MED ORDER — CARVEDILOL 3.125 MG PO TABS
6.2500 mg | ORAL_TABLET | Freq: Two times a day (BID) | ORAL | Status: DC
  Administered 2017-12-22 – 2017-12-23 (×3): 6.25 mg via ORAL
  Filled 2017-12-22 (×3): qty 2

## 2017-12-22 MED ORDER — INSULIN GLARGINE 100 UNIT/ML ~~LOC~~ SOLN
60.0000 [IU] | Freq: Every day | SUBCUTANEOUS | Status: DC
  Filled 2017-12-22: qty 0.6

## 2017-12-22 MED ORDER — NICOTINE 14 MG/24HR TD PT24
14.0000 mg | MEDICATED_PATCH | Freq: Every day | TRANSDERMAL | Status: DC
  Administered 2017-12-22 – 2017-12-23 (×2): 14 mg via TRANSDERMAL
  Filled 2017-12-22 (×2): qty 1

## 2017-12-22 MED ORDER — GABAPENTIN 400 MG PO CAPS
800.0000 mg | ORAL_CAPSULE | Freq: Four times a day (QID) | ORAL | Status: DC
  Administered 2017-12-22 – 2017-12-23 (×5): 800 mg via ORAL
  Filled 2017-12-22 (×5): qty 2

## 2017-12-22 MED ORDER — TRAZODONE HCL 50 MG PO TABS
50.0000 mg | ORAL_TABLET | Freq: Every day | ORAL | Status: DC
  Administered 2017-12-22: 50 mg via ORAL
  Filled 2017-12-22: qty 1

## 2017-12-22 MED ORDER — OXYCODONE HCL 5 MG PO TABS
5.0000 mg | ORAL_TABLET | Freq: Four times a day (QID) | ORAL | Status: DC | PRN
Start: 2017-12-22 — End: 2017-12-23
  Administered 2017-12-22 – 2017-12-23 (×5): 10 mg via ORAL
  Filled 2017-12-22 (×5): qty 2

## 2017-12-22 MED ORDER — ASPIRIN EC 81 MG PO TBEC
81.0000 mg | DELAYED_RELEASE_TABLET | Freq: Every day | ORAL | Status: DC
  Administered 2017-12-22 – 2017-12-23 (×2): 81 mg via ORAL
  Filled 2017-12-22 (×2): qty 1

## 2017-12-22 MED ORDER — INSULIN ASPART 100 UNIT/ML ~~LOC~~ SOLN
0.0000 [IU] | Freq: Three times a day (TID) | SUBCUTANEOUS | Status: DC
  Administered 2017-12-22: 3 [IU] via SUBCUTANEOUS
  Administered 2017-12-22 – 2017-12-23 (×3): 2 [IU] via SUBCUTANEOUS
  Administered 2017-12-23: 5 [IU] via SUBCUTANEOUS
  Filled 2017-12-22 (×5): qty 1

## 2017-12-22 MED ORDER — LISINOPRIL 5 MG PO TABS
5.0000 mg | ORAL_TABLET | Freq: Every day | ORAL | Status: DC
  Administered 2017-12-22: 5 mg via ORAL
  Filled 2017-12-22: qty 1

## 2017-12-22 MED ORDER — IPRATROPIUM-ALBUTEROL 0.5-2.5 (3) MG/3ML IN SOLN: 3.0000 mL | Freq: Four times a day (QID) | RESPIRATORY_TRACT | Status: DC | PRN

## 2017-12-22 MED ORDER — ATORVASTATIN CALCIUM 20 MG PO TABS
80.0000 mg | ORAL_TABLET | Freq: Every day | ORAL | Status: DC
  Administered 2017-12-22 – 2017-12-23 (×2): 80 mg via ORAL
  Filled 2017-12-22 (×2): qty 4

## 2017-12-22 NOTE — Progress Notes (Signed)
CODE SEPSIS - PHARMACY COMMUNICATION  **Broad Spectrum Antibiotics should be administered within 1 hour of Sepsis diagnosis**  Time Code Sepsis Called/Page Received:   Antibiotics Ordered: Vancomycin, Zosyn   Time of 1st antibiotic administration:  7/15 @ 0437   Additional action taken by pharmacy:   If necessary, Name of Provider/Nurse Contacted:     Sirenia Whitis D ,PharmD Clinical Pharmacist  12/22/2017  4:44 AM

## 2017-12-22 NOTE — ED Notes (Signed)
Patient transported to CT 

## 2017-12-22 NOTE — ED Notes (Signed)
Patient transported to 157

## 2017-12-22 NOTE — Consult Note (Signed)
[facility] Nurse wound consult note Reason for Consult: foot ulcer Patient with Charcot foot with plantar foot ulceration. Followed by [facility] wound care clinic. He reports he sees MD at the wound care clinic 1x month. Wound type: neuropathic foot ulceration Pressure Injury POA: NA Measurement: 5cm x 4cm x 1.0cm  Wound bed:90% clean, pink, non granular.  10% yellow/grey slough Drainage (amount, consistency, odor) yellow, slight odor Periwound: intact, palpable distal pulses  Dressing procedure/placement/frequency: Continue POC established by [facility] Saline moist gauze, top with dry dressing. Change daily.  Follow up with [facility] as scheduled.  Discussed POC with patient and bedside nurse.  Re consult if needed, will not follow at this time. Thanks  Vandella Ord [employer], RN,CWOCN, CNS, [place] [phone])

## 2017-12-22 NOTE — ED Notes (Signed)
First nurse notified of patient condition and need for bed.

## 2017-12-22 NOTE — Progress Notes (Signed)
[place] [facility] [place] NAME: [name] [name]    MR#:  [id number]  DATE OF BIRTH:  02/09/1961  SUBJECTIVE:  CHIEF COMPLAINT:   Chief Complaint  Patient presents with  . Chest Pain   - admitted this morning with shortness of breath and fever - Ct chest with pneumonitis and right foot ulcer  REVIEW OF SYSTEMS:  Review of Systems  Constitutional: Positive for chills, fever and malaise/fatigue.  HENT: Negative for congestion, ear discharge, hearing loss and nosebleeds.   Eyes: Negative for blurred vision and double vision.  Respiratory: Negative for cough, shortness of breath and wheezing.   Cardiovascular: Negative for chest pain, palpitations and leg swelling.  Gastrointestinal: Negative for abdominal pain, constipation, diarrhea, nausea and vomiting.  Genitourinary: Negative for dysuria.  Musculoskeletal: Positive for joint pain and myalgias.  Neurological: Negative for dizziness, focal weakness, seizures, weakness and headaches.  Psychiatric/Behavioral: Negative for depression.    DRUG ALLERGIES:   Allergies  Allergen Reactions  . Dulaglutide Anaphylaxis, Diarrhea and Hives    VITALS:  Blood pressure 115/86, pulse (!) 142, temperature 98.9 F (37.2 C), temperature source Oral, resp. rate (!) 21, height 5\' 7"  (1.702 m), weight 79.4 kg (175 lb), SpO2 95 %.  PHYSICAL EXAMINATION:  Physical Exam   GENERAL:  57 y.o.-year-old patient lying in the bed with no acute distress.  EYES: Pupils equal, round, reactive to light and accommodation. No scleral icterus. Extraocular muscles intact.  HEENT: Head atraumatic, normocephalic. Oropharynx and nasopharynx clear.  NECK:  Supple, no jugular venous distention. No thyroid enlargement, no tenderness.  LUNGS: moving air bilaterally, scattered wheezing, no rales,rhonchi or crepitation. No use of accessory muscles of respiration.  CARDIOVASCULAR: S1, S2 normal. No murmurs, rubs, or gallops.    ABDOMEN: Soft, nontender, nondistended. Bowel sounds present. No organomegaly or mass.  EXTREMITIES: No pedal edema, cyanosis, or clubbing. Right foot dressing in place for a plantar ulcer NEUROLOGIC: Cranial nerves II through XII are intact. Muscle strength 5/5 in all extremities. Sensation intact. Gait not checked.  PSYCHIATRIC: The patient is alert and oriented x 3.  SKIN: No obvious rash, lesion, or ulcer.    LABORATORY PANEL:   CBC Recent Labs  Lab 12/21/17 2238  WBC 12.9*  HGB 11.9*  HCT 34.0*  PLT 271   ------------------------------------------------------------------------------------------------------------------  Chemistries  Recent Labs  Lab 12/21/17 2238  NA 130*  K 4.8  CL 94*  CO2 28  GLUCOSE 206*  BUN 16  CREATININE 1.09  CALCIUM 9.1  AST 19  ALT 12  ALKPHOS 86  BILITOT 1.1   ------------------------------------------------------------------------------------------------------------------  Cardiac Enzymes No results for input(s): TROPONINI in the last 168 hours. ------------------------------------------------------------------------------------------------------------------  RADIOLOGY:  Dg Chest 2 View  Result Date: 12/21/2017 CLINICAL DATA:  Patient reports upper chest pain (points to base of neck) all day that has gotten worse over the days. Hx/o asthma, carotid arterial disease, CHF, COPD, PAD, and diabetes Smoker EXAM: CHEST - 2 VIEW COMPARISON:  11/21/2017 FINDINGS: Mild hyperinflation. Midline trachea. Normal heart size. Atherosclerosis in the transverse aorta. No pleural effusion or pneumothorax. Diffuse peribronchial thickening. Clear lungs. IMPRESSION: No acute cardiopulmonary disease. Hyperinflation and mild interstitial thickening, most consistent with COPD/chronic bronchitis. Aortic Atherosclerosis (ICD10-I70.0). Electronically Signed   By: Abigail Miyamoto M.D.   On: 12/21/2017 23:35   Ct Chest Wo Contrast  Result Date: 12/22/2017 CLINICAL  DATA:  57 year old male with chest pain and shortness of breath. EXAM: CT CHEST WITHOUT CONTRAST TECHNIQUE: Multidetector  CT imaging of the chest was performed following the standard protocol without IV contrast. COMPARISON:  Chest radiograph dated 12/21/2017 and CT dated 04/22/2017 FINDINGS: Evaluation of this exam is limited in the absence of intravenous contrast. Cardiovascular: Top-normal cardiac size. No pericardial effusion. Advanced multi vessel coronary vascular calcification. There is mild atherosclerotic calcification of the thoracic aorta. The aorta and central pulmonary arteries are otherwise grossly unremarkable on this noncontrast CT. Mediastinum/Nodes: No hilar or mediastinal adenopathy. Esophagus and the thyroid gland are grossly unremarkable. No mediastinal fluid collection. Lungs/Pleura: Bilateral ground-glass nodular densities most consistent with pneumonia, likely atypical in etiology. Clinical correlation and follow-up to resolution recommended. No focal consolidation, pleural effusion, or pneumothorax. Upper Abdomen: No acute abnormality. Musculoskeletal: Degenerative changes of the spine. No acute osseous pathology. IMPRESSION: 1. Innumerable small ground-glass pulmonary nodules most consistent with an inflammatory/infectious etiology. Clinical correlation and follow-up to resolution recommended. 2. Advanced multi vessel coronary vascular calcification. 3.  Aortic Atherosclerosis (ICD10-I70.0). Electronically Signed   By: Anner Crete M.D.   On: 12/22/2017 05:37   Dg Foot Complete Right  Result Date: 12/22/2017 CLINICAL DATA:  57 year old male with diabetic ulcer on the plantar right foot. EXAM: RIGHT FOOT COMPLETE - 3+ VIEW COMPARISON:  Right foot radiograph dated 07/10/2016 FINDINGS: There is no acute fracture or dislocation. Degenerative changes of the midfoot primarily involving the third tarsometatarsal joint. No periosteal reaction or erosive changes to suggest acute  osteomyelitis. There is a large ulcer of the lateral aspect of the plantar soft tissues of the midfoot over the base of the fifth metatarsal which appears larger compared to the prior radiograph. There is diffuse soft tissue edema. No radiopaque foreign object. IMPRESSION: 1. Large soft tissue ulcer at the lateral plantar midfoot. 2. No acute fracture or dislocation. No radiographic evidence of acute osteomyelitis. MRI or a WBC nuclear scan may provide better evaluation if there is high clinical concern for acute osteomyelitis. Electronically Signed   By: Anner Crete M.D.   On: 12/22/2017 05:42    EKG:   Orders placed or performed during the hospital encounter of 12/22/17  . EKG 12-Lead  . EKG 12-Lead  . ED EKG 12-Lead  . ED EKG 12-Lead    ASSESSMENT AND PLAN:   57 year old male with past medical history significant for diabetes mellitus with Charcot joints, hypertension, cardiomyopathy, peripheral neuropathy, peripheral arterial disease presents to hospital secondary to fever and shortness of breath  1.  Sepsis-secondary to pneumonitis and possible infection of diabetic foot ulcer -Blood cultures are pending. -Appreciate wound care nurse consult, dressing in place. -Continue broad-spectrum antibiotics with vancomycin and Zosyn  2.  Acute hypoxic respiratory failure-secondary to COPD exacerbation and also pneumonitis -On antibiotics. -Added steroids and duo nebs. -Counseled against smoking.  Started on nicotine patch - on supplemental oxygen 2L now  3. DM- lantus, SSI  4. CAD- stable- asa, plavix, coreg, statin and lisinopril  5. DVT Prophylaxis- lovenox  PT consult     All the records are reviewed and case discussed with Care Management/Social Workerr. Management plans discussed with the patient, family and they are in agreement.  CODE STATUS: Full Code  TOTAL TIME TAKING CARE OF THIS PATIENT: 38 minutes.   POSSIBLE D/C IN 2 DAYS, DEPENDING ON CLINICAL  CONDITION.   Gladstone Lighter M.D on 12/22/2017 at 1:00 PM  Between 7am to 6pm - Pager - [phone]  After 6pm go to [email or web address] - password [email or web address] [facility]  Office  [phone]  CC: Primary care  physician; Clarisse Gouge, MD

## 2017-12-22 NOTE — ED Provider Notes (Signed)
[facility] Emergency Department Provider Note   First MD Initiated Contact with Patient 12/22/17 0405     (approximate)  I have reviewed the triage vital signs and the nursing notes.   HISTORY  Chief Complaint Chest Pain    HPI [name] [name] is a 57 y.o. male below list of chronic medical conditions presents to the emergency department with acute onset of upper chest discomfort and dyspnea which patient states has been occurring all day.  Patient noted to be febrile tachycardic hypotensive and tachypneic on arrival to the emergency department.  Patient denies any abdominal pain no nausea vomiting or diarrhea.  Patient denies any urinary symptoms.   Past Medical History:  Diagnosis Date  . Arthritis   . Asthma   . Atherosclerosis   . BPH (benign prostatic hyperplasia)   . Carotid arterial disease ([place])   . Charcot's joint of foot, right   . Chronic combined systolic (congestive) and diastolic (congestive) heart failure ([place])    a. 02/2013 EF 50% by LV gram; b. 04/2017 Echo: EF 25-30%. diff HK. Gr2 DD. Mod dil LA/RV. PASP 61mmHg.  [name] COPD (chronic obstructive pulmonary disease) ([place])   . Coronary artery disease    a. 2013 S/P PCI of LAD [facility]);  b. 03/2013 PCI: RCA 90p ( 2.5 x 23 mm DES); c. 02/2014 Cath: patent RCA stent->Med Rx; d. 04/2017 Cath: LM nl, LAd 30ost/p, 76m/d, D1 80ost, LCX 90p/m, OM1 85, RCA 70/20p, 44m, 70d->Referred for CT Surg-felt to be poor candidate.  . Diabetic neuropathy ([place])   . Diabetic ulcer of right foot ([place])   . Hernia   . Hyperlipidemia   . Hypertension   . Ischemic cardiomyopathy    a. 04/2017 Echo: EF 25-30%; b. 08/2017 Cardiac MRI ([facility]): EF 19%, sev glob HK. RVEF 20%, mod BAE, triv MR, mild-mod TR. Basal lateral subendocardial infarct (viable), inf/infsept, dist septal ischemia, basal to mid lat peri-infarct ischemia.  . Morbid obesity ([place])   . Neuropathy   . PAD (peripheral artery disease) ([place])    a. Followed by Dr.  Lucky Cowboy; b. 08/2010 Periph Angio: RSFA 70-80p (6X60 self-expanding stent); c. 09/2010 Periph Angio: L SFA 100p (7x unknown length self-expanding stent); d. 06/2015 Periph Angio: R SFA short segment occlusion (6x12 self-expanding stent).  . Restless leg syndrome   . Sleep apnea   . Subclavian artery stenosis, right ([place])   . Syncope and collapse   . Tobacco use    a. 75+ yr hx - still smoking 1ppd, down from 2 ppd.  . Type II diabetes mellitus ([place])   . Varicose vein     Patient Active Problem List   Diagnosis Date Noted  . NSTEMI (non-ST elevated myocardial infarction) ([place]) 11/21/2017  . Diabetes ([place]) 05/02/2017  . Lymphedema 05/02/2017  . Chronic systolic CHF (congestive heart failure) ([place]) 04/23/2017  . Chest pain 07/11/2016  . Unstable angina ([place]) 07/11/2016  . Ulcer of right foot ([place])   . Stable angina ([facility])   . Pressure injury of skin 05/06/2016  . Diabetic foot infection ([place]) 05/05/2016  . Cellulitis 11/21/2015  . Morbid obesity ([place]) 11/17/2014  . PAD (peripheral artery disease) ([place])   . Coronary artery disease   . Hyperlipidemia   . Hypertension   . Tobacco use     Past Surgical History:  Procedure Laterality Date  . ABDOMINAL AORTAGRAM N/A 06/23/2013   Procedure: ABDOMINAL Maxcine Ham;  Surgeon: Wellington Hampshire, MD;  Location: [facility];  Service: Cardiovascular;  Laterality: N/A;  . APPENDECTOMY    . CARDIAC CATHETERIZATION  10/14   [facility]; X1 STENT PROXIMAL RCA  . CARDIAC CATHETERIZATION  02/2010   [facility]  . CARDIAC CATHETERIZATION  02/21/2014   [facility]  . CLAVICLE SURGERY    . HERNIA REPAIR    . LEFT HEART CATH AND CORS/GRAFTS ANGIOGRAPHY N/A 04/28/2017   Procedure: LEFT HEART CATH AND CORONARY ANGIOGRAPHY;  Surgeon: Wellington Hampshire, MD;  Location: [facility];  Service: Cardiovascular;  Laterality: N/A;  . PERIPHERAL ARTERIAL STENT GRAFT     x2 left/right  . PERIPHERAL VASCULAR CATHETERIZATION N/A 07/06/2015   Procedure: Abdominal Aortogram w/Lower  Extremity;  Surgeon: Algernon Huxley, MD;  Location: [facility];  Service: Cardiovascular;  Laterality: N/A;  . PERIPHERAL VASCULAR CATHETERIZATION  07/06/2015   Procedure: Lower Extremity Intervention;  Surgeon: Algernon Huxley, MD;  Location: [facility];  Service: Cardiovascular;;    Prior to Admission medications   Medication Sig Start Date End Date Taking? Authorizing Provider  albuterol (PROVENTIL HFA;VENTOLIN HFA) 108 (90 BASE) MCG/ACT inhaler Inhale 2 puffs into the lungs every 6 (six) hours as needed for wheezing or shortness of breath. 04/15/13   Wellington Hampshire, MD  aspirin EC 81 MG tablet Take 1 tablet (81 mg total) by mouth daily. 05/11/15   Wellington Hampshire, MD  atorvastatin (LIPITOR) 80 MG tablet Take 80 mg by mouth daily.    [provider]  carvedilol (COREG) 6.25 MG tablet Take 1 tablet by mouth 2 (two) times daily. 09/10/17 09/10/18  [provider]  clopidogrel (PLAVIX) 75 MG tablet Take 1 tablet by mouth daily. 08/22/17   [provider]  furosemide (LASIX) 40 MG tablet Take 1 tablet (40 mg total) 2 (two) times daily by mouth. Patient taking differently: Take 40 mg by mouth daily as needed for fluid.  04/29/17 04/29/18  Max Sane, MD  gabapentin (NEURONTIN) 800 MG tablet Take 1 tablet by mouth 4 (four) times daily. 08/14/17 08/14/18  [provider]  Insulin Glargine (LANTUS SOLOSTAR) 100 UNIT/ML Solostar Pen Inject 40-80 Units into the skin 2 (two) times daily as needed. 80 in am; 40 units at night 01/21/17   [provider]  insulin lispro (HUMALOG) 100 UNIT/ML injection Inject 0-10 Units into the skin 3 (three) times daily with meals. If BS is 0 to 70 no insulin and eat/glucose tablets/glucose get If BS 71 to 150 great! If BS 151 to 200, 2 Units Humaog If BS 201 to 250, 4 Units Humalog If BS 251 to 300, 6 Units Humalog If BS 301 to 350, 8 Units Humalog If BS 351 to 400, 10 Units Humalog If BS greater than 400, take 10  Units and Contact our office during office hour    [provider]  ipratropium-albuterol (DUONEB) 0.5-2.5 (3) MG/3ML SOLN Inhale 3 mLs into the lungs 3 (three) times daily.    [provider]  lisinopril (PRINIVIL,ZESTRIL) 5 MG tablet Take 1 tablet by mouth daily. 09/10/17 09/10/18  [provider]  metFORMIN (GLUCOPHAGE-XR) 500 MG 24 hr tablet Take 500 mg by mouth daily with breakfast.    [provider]  nicotine (NICODERM CQ - DOSED IN MG/24 HOURS) 14 mg/24hr patch Place 1 patch (14 mg total) onto the skin daily. 11/23/17   Salary, Avel Peace, MD  nitroGLYCERIN (NITRODUR - DOSED IN MG/24 HR) 0.4 mg/hr patch Place 1 patch onto the skin daily. Changes patch every  12 hours 08/14/17   [provider]  nitroGLYCERIN (NITROSTAT) 0.4 MG SL tablet Place 0.4 mg under the tongue every 5 (five) minutes as needed for chest pain.    [provider]  traZODone (DESYREL) 50 MG tablet Take 1 tablet by mouth at bedtime. 08/12/17   [provider]    Allergies Dulaglutide  Family History  Problem Relation Age of Onset  . Heart attack Father   . Heart disease Father   . Hypertension Father   . Hyperlipidemia Father   . Hypertension Mother   . Hyperlipidemia Mother     Social History Social History   Tobacco Use  . Smoking status: Current Every Day Smoker    Packs/day: 1.00    Years: 41.00    Pack years: 41.00    Types: Cigarettes  . Smokeless tobacco: Never Used  Substance Use Topics  . Alcohol use: No  . Drug use: No    Review of Systems Constitutional: Positive for fever/chills Eyes: No visual changes. ENT: No sore throat. Cardiovascular: Positive for chest pain. Respiratory: Positive for cough and dyspnea. Gastrointestinal: No abdominal pain.  No nausea, no vomiting.  No diarrhea.  No constipation. Genitourinary: Negative for dysuria. Musculoskeletal: Negative for neck pain.  Negative for back pain. Integumentary: Negative for  rash. Neurological: Negative for headaches, focal weakness or numbness.   ____________________________________________   PHYSICAL EXAM:  VITAL SIGNS: ED Triage Vitals  Enc Vitals Group     BP 12/21/17 2232 (!) 78/55     Pulse Rate 12/21/17 2232 (!) 134     Resp 12/21/17 2232 (!) 26     Temp 12/21/17 2232 (!) 103.2 F (39.6 C)     Temp Source 12/21/17 2232 Oral     SpO2 12/21/17 2232 95 %     Weight 12/21/17 2231 79.4 kg (175 lb)     Height 12/21/17 2231 1.702 m (5\' 7" )     Head Circumference --      Peak Flow --      Pain Score --      Pain Loc --      Pain Edu? --      Excl. in [place]? --     Constitutional: Alert and oriented. Well appearing and in no acute distress. Eyes: Conjunctivae are normal.  Head: Atraumatic. Mouth/Throat: Mucous membranes are moist. Oropharynx non-erythematous. Neck: No stridor. Cardiovascular: Normal rate, regular rhythm. Good peripheral circulation. Grossly normal heart sounds. Respiratory: Normal respiratory effort.  No retractions. Lungs CTAB. Gastrointestinal: Soft and nontender. No distention.  Musculoskeletal: No lower extremity tenderness nor edema. No gross deformities of extremities. Neurologic:  Normal speech and language. No gross focal neurologic deficits are appreciated.  Skin:  Skin is warm, dry and intact. No rash noted. Psychiatric: Mood and affect are normal. Speech and behavior are normal.  ____________________________________________   LABS (all labs ordered are listed, but only abnormal results are displayed)  Labs Reviewed  COMPREHENSIVE METABOLIC PANEL - Abnormal; Notable for the following components:      Result Value   Sodium 130 (*)    Chloride 94 (*)    Glucose, Bld 206 (*)    All other components within normal limits  CBC WITH DIFFERENTIAL/PLATELET - Abnormal; Notable for the following components:   WBC 12.9 (*)    RBC 4.24 (*)    Hemoglobin 11.9 (*)    HCT 34.0 (*)    RDW 21.4 (*)    Neutro Abs 11.5 (*)  Lymphs Abs 0.8 (*)    All other components within normal limits  CULTURE, BLOOD (ROUTINE X 2)  CULTURE, BLOOD (ROUTINE X 2)  LACTIC ACID, PLASMA  LACTIC ACID, PLASMA  URINALYSIS, COMPLETE (UACMP) WITH MICROSCOPIC  PROTIME-INR  LACTIC ACID, PLASMA   ____________________________________________  EKG  ED ECG REPORT I, Kiana N BROWN, the attending physician, personally viewed and interpreted this ECG.   Date: 12/22/2017  EKG Time: 10:35 PM  Rate: 134  Rhythm:Sinus Tachycardia  Axis: Normal  Intervals:Normal  ST&T Change: None  ____________________________________________  RADIOLOGY I, [place] N BROWN, personally viewed and evaluated these images (plain radiographs) as part of my medical decision making, as well as reviewing the written report by the radiologist.  ED MD interpretation:    Official radiology report(s): Dg Chest 2 View  Result Date: 12/21/2017 CLINICAL DATA:  Patient reports upper chest pain (points to base of neck) all day that has gotten worse over the days. Hx/o asthma, carotid arterial disease, CHF, COPD, PAD, and diabetes Smoker EXAM: CHEST - 2 VIEW COMPARISON:  11/21/2017 FINDINGS: Mild hyperinflation. Midline trachea. Normal heart size. Atherosclerosis in the transverse aorta. No pleural effusion or pneumothorax. Diffuse peribronchial thickening. Clear lungs. IMPRESSION: No acute cardiopulmonary disease. Hyperinflation and mild interstitial thickening, most consistent with COPD/chronic bronchitis. Aortic Atherosclerosis (ICD10-I70.0). Electronically Signed   By: Abigail Miyamoto M.D.   On: 12/21/2017 23:35      Critical Care performed:    .Critical Care Performed by: Gregor Hams, MD Authorized by: Gregor Hams, MD   Critical care provider statement:    Critical care time (minutes):  30   Critical care time was exclusive of:  Separately billable procedures and treating other patients   Critical care was necessary to treat or prevent  imminent or life-threatening deterioration of the following conditions:  Sepsis   Critical care was time spent personally by me on the following activities:  Development of treatment plan with patient or surrogate, discussions with consultants, evaluation of patient's response to treatment, examination of patient, obtaining history from patient or surrogate, ordering and performing treatments and interventions, ordering and review of laboratory studies, ordering and review of radiographic studies, pulse oximetry, re-evaluation of patient's condition and review of old charts   I assumed direction of critical care for this patient from another provider in my specialty: no       ____________________________________________   INITIAL IMPRESSION / ASSESSMENT AND PLAN / ED COURSE  As part of my medical decision making, I reviewed the following data within the electronic MEDICAL RECORD NUMBER   57 year old male presenting to the emergency department with above-stated history and physical exam who clearly meets criteria for sepsis as the patient is febrile tachycardic tachypneic hypotensive with a leukocytosis.  Sepsis protocol initiated immediately when I saw the patient patient given appropriate IV antibiotic therapy.  Suspect pulmonary etiology given patient's symptoms chest x-ray did not show any evidence of pneumonia however will perform a CT scan of the chest to further evaluate for pneumonia.  Patient discussed with Dr. Marcille Blanco for hospital admission for further evaluation and management ____________________________________________  FINAL CLINICAL IMPRESSION(S) / ED DIAGNOSES  Final diagnoses:  Sepsis, due to unspecified organism [facility])     MEDICATIONS GIVEN DURING THIS VISIT:  Medications  acetaminophen (TYLENOL) tablet 650 mg (has no administration in time range)  piperacillin-tazobactam (ZOSYN) IVPB 3.375 g (has no administration in time range)  vancomycin (VANCOCIN) IVPB 1000 mg/200 mL  premix (has no administration in time range)  sodium chloride 0.9 % bolus 1,000 mL (0 mLs Intravenous Stopped 12/22/17 0404)     ED Discharge Orders    None       Note:  This document was prepared using Dragon voice recognition software and may include unintentional dictation errors.    Gregor Hams, MD 12/22/17 [phone]

## 2017-12-22 NOTE — ED Notes (Signed)
Admission process explained to pt who verbalizes understanding. Call bell at right side, blanket provided.

## 2017-12-22 NOTE — H&P (Signed)
[name] [name] is an 57 y.o. male.   Chief Complaint: Chest pain HPI: The patient with past medical history of coronary artery disease status post PCI, diabetes with Charcot joints as well as neuropathy, and ischemic cardiomyopathy with hypertension presents emergency department complaining of chest pain.  The patient states that he has a "heaviness" in his chest and occasionally feels short of breath.  The patient was found to have a fever (T-max 103.2 F) as well as tachycardia and tachypnea upon arrival.  Blood pressure was also soft which prompted the emergency department staff to initiate sepsis protocol prior to calling the hospitalist service for admission.  Past Medical History:  Diagnosis Date  . Arthritis   . Asthma   . Atherosclerosis   . BPH (benign prostatic hyperplasia)   . Carotid arterial disease ([place])   . Charcot's joint of foot, right   . Chronic combined systolic (congestive) and diastolic (congestive) heart failure ([place])    a. 02/2013 EF 50% by LV gram; b. 04/2017 Echo: EF 25-30%. diff HK. Gr2 DD. Mod dil LA/RV. PASP 26mHg.  .Marland KitchenCOPD (chronic obstructive pulmonary disease) ([place]   . Coronary artery disease    a. 2013 S/P PCI of LAD ([facility];  b. 03/2013 PCI: RCA 90p ( 2.5 x 23 mm DES); c. 02/2014 Cath: patent RCA stent->Med Rx; d. 04/2017 Cath: LM nl, LAd 30ost/p, 664m, D1 80ost, LCX 90p/m, OM1 85, RCA 70/20p, 8045m0d->Referred for CT Surg-felt to be poor candidate.  . Diabetic neuropathy (HCC[place]allis . Diabetic ulcer of right foot (HCCGreenlee . Hernia   . Hyperlipidemia   . Hypertension   . Ischemic cardiomyopathy    a. 04/2017 Echo: EF 25-30%; b. 08/2017 Cardiac MRI ([facility]): EF 19%, sev glob HK. RVEF 20%, mod BAE, triv MR, mild-mod TR. Basal lateral subendocardial infarct (viable), inf/infsept, dist septal ischemia, basal to mid lat peri-infarct ischemia.  . Morbid obesity (HCCWaterflow . Neuropathy   . PAD (peripheral artery disease) (HCC[place]incoln  a. Followed by Dr. DewLucky Cowboy. 08/2010 Periph  Angio: RSFA 70-80p (6X60 self-expanding stent); c. 09/2010 Periph Angio: L SFA 100p (7x unknown length self-expanding stent); d. 06/2015 Periph Angio: R SFA short segment occlusion (6x12 self-expanding stent).  . Restless leg syndrome   . Sleep apnea   . Subclavian artery stenosis, right (HCCNorth Falmouth . Syncope and collapse   . Tobacco use    a. 75+ yr hx - still smoking 1ppd, down from 2 ppd.  . Type II diabetes mellitus (HCCMora . Varicose vein     Past Surgical History:  Procedure Laterality Date  . ABDOMINAL AORTAGRAM N/A 06/23/2013   Procedure: ABDOMINAL AORMaxcine HamSurgeon: MuhWellington HampshireD;  [facility];  Service: Cardiovascular;  Laterality: N/A;  . APPENDECTOMY    . CARDIAC CATHETERIZATION  10/14   [facility]; X1 STENT PROXIMAL RCA  . CARDIAC CATHETERIZATION  02/2010   [facility]  . CARDIAC [facility]  [facility]  . CLAVICLE SURGERY    . HERNIA REPAIR    . LEFT HEART CATH AND CORS/GRAFTS ANGIOGRAPHY N/A 04/28/2017   Procedure: LEFT HEART CATH AND CORONARY ANGIOGRAPHY;  Surgeon: AriWellington HampshireD;  [facility];  Service: Cardiovascular;  Laterality: N/A;  . PERIPHERAL ARTERIAL STENT GRAFT     x2 left/right  . PERIPHERAL VASCULAR CATHETERIZATION N/A 07/06/2015   Procedure: Abdominal Aortogram w/Lower Extremity;  Surgeon: JasAlgernon HuxleyD;  Location:  [place] [facility];  Service: Cardiovascular;  Laterality: N/A;  . PERIPHERAL VASCULAR CATHETERIZATION  07/06/2015   Procedure: Lower Extremity Intervention;  Surgeon: Algernon Huxley, MD;  Location: [place] [facility];  Service: Cardiovascular;;    Family History  Problem Relation Age of Onset  . Heart attack Father   . Heart disease Father   . Hypertension Father   . Hyperlipidemia Father   . Hypertension Mother   . Hyperlipidemia Mother    Social History:  reports that he has been smoking cigarettes.  He has a 41.00 pack-year smoking history. He has never used smokeless tobacco. He reports that he  does not drink alcohol or use drugs.  Allergies:  Allergies  Allergen Reactions  . Dulaglutide Anaphylaxis, Diarrhea and Hives    Medications Prior to Admission  Medication Sig Dispense Refill  . albuterol (PROVENTIL HFA;VENTOLIN HFA) 108 (90 BASE) MCG/ACT inhaler Inhale 2 puffs into the lungs every 6 (six) hours as needed for wheezing or shortness of breath. 1 Inhaler 2  . aspirin EC 81 MG tablet Take 1 tablet (81 mg total) by mouth daily. 90 tablet 3  . atorvastatin (LIPITOR) 80 MG tablet Take 80 mg by mouth daily.    . carvedilol (COREG) 6.25 MG tablet Take 1 tablet by mouth 2 (two) times daily.    . clopidogrel (PLAVIX) 75 MG tablet Take 1 tablet by mouth daily.    . furosemide (LASIX) 40 MG tablet Take 1 tablet (40 mg total) 2 (two) times daily by mouth. (Patient taking differently: Take 40 mg by mouth daily as needed for fluid. ) 60 tablet 11  . gabapentin (NEURONTIN) 800 MG tablet Take 1 tablet by mouth 4 (four) times daily.    . Insulin Glargine (LANTUS SOLOSTAR) 100 UNIT/ML Solostar Pen Inject 40-80 Units into the skin 2 (two) times daily as needed. 80 in am; 40 units at night    . insulin lispro (HUMALOG) 100 UNIT/ML injection Inject 0-10 Units into the skin 3 (three) times daily with meals. If BS is 0 to 70 no insulin and eat/glucose tablets/glucose get If BS 71 to 150 great! If BS 151 to 200, 2 Units Humaog If BS 201 to 250, 4 Units Humalog If BS 251 to 300, 6 Units Humalog If BS 301 to 350, 8 Units Humalog If BS 351 to 400, 10 Units Humalog If BS greater than 400, take 10 Units and Contact our office during office hour    . ipratropium-albuterol (DUONEB) 0.5-2.5 (3) MG/3ML SOLN Inhale 3 mLs into the lungs every 6 (six) hours as needed (wheezing/sob).     [name] lisinopril (PRINIVIL,ZESTRIL) 5 MG tablet Take 1 tablet by mouth daily.    . metFORMIN (GLUCOPHAGE-XR) 500 MG 24 hr tablet Take 500 mg by mouth daily with breakfast.    . nitroGLYCERIN (NITRODUR - DOSED IN MG/24 HR) 0.4  mg/hr patch Place 1 patch onto the skin daily. Changes patch every 12 hours    . nitroGLYCERIN (NITROSTAT) 0.4 MG SL tablet Place 0.4 mg under the tongue every 5 (five) minutes as needed for chest pain.    [name] oxyCODONE (OXY IR/ROXICODONE) 5 MG immediate release tablet Take 5-10 mg by mouth every 6 (six) hours as needed for severe pain.     . traZODone (DESYREL) 50 MG tablet Take 1 tablet by mouth at bedtime.    . nicotine (NICODERM CQ - DOSED IN MG/24 HOURS) 14 mg/24hr patch Place 1 patch (14 mg total) onto the skin daily.  28 patch 0    Results for orders placed or performed during the hospital encounter of 12/22/17 (from the past 48 hour(s))  Comprehensive metabolic panel     Status: Abnormal   Collection Time: 12/21/17 10:38 PM  Result Value Ref Range   Sodium 130 (L) 135 - 145 mmol/L   Potassium 4.8 3.5 - 5.1 mmol/L   Chloride 94 (L) 98 - 111 mmol/L    Comment: Please note change in reference range.   CO2 28 22 - 32 mmol/L   Glucose, Bld 206 (H) 70 - 99 mg/dL    Comment: Please note change in reference range.   BUN 16 6 - 20 mg/dL    Comment: Please note change in reference range.   Creatinine, Ser 1.09 0.61 - 1.24 mg/dL   Calcium 9.1 8.9 - 10.3 mg/dL   Total Protein 7.6 6.5 - 8.1 g/dL   Albumin 3.5 3.5 - 5.0 g/dL   AST 19 15 - 41 U/L   ALT 12 0 - 44 U/L    Comment: Please note change in reference range.   Alkaline Phosphatase 86 38 - 126 U/L   Total Bilirubin 1.1 0.3 - 1.2 mg/dL   GFR calc non Af Amer >60 >60 mL/min   GFR calc Af Amer >60 >60 mL/min    Comment: (NOTE) The eGFR has been calculated using the CKD EPI equation. This calculation has not been validated in all clinical situations. eGFR's persistently <60 mL/min signify possible Chronic Kidney Disease.    Anion gap 8 5 - 15    Comment: Performed at [facility], [address] [place], [place] [address]  CBC with Differential     Status: Abnormal   Collection Time: 12/21/17 10:38 PM  Result Value Ref  Range   WBC 12.9 (H) 3.8 - 10.6 K/uL   RBC 4.24 (L) 4.40 - 5.90 MIL/uL   Hemoglobin 11.9 (L) 13.0 - 18.0 g/dL   HCT 34.0 (L) 40.0 - 52.0 %   MCV 80.2 80.0 - 100.0 fL   MCH 28.1 26.0 - 34.0 pg   MCHC 35.1 32.0 - 36.0 g/dL   RDW 21.4 (H) 11.5 - 14.5 %   Platelets 271 150 - 440 K/uL   Neutrophils Relative % 90 %   Neutro Abs 11.5 (H) 1.4 - 6.5 K/uL   Lymphocytes Relative 6 %   Lymphs Abs 0.8 (L) 1.0 - 3.6 K/uL   Monocytes Relative 4 %   Monocytes Absolute 0.6 0.2 - 1.0 K/uL   Eosinophils Relative 0 %   Eosinophils Absolute 0.0 0 - 0.7 K/uL   Basophils Relative 0 %   Basophils Absolute 0.0 0 - 0.1 K/uL    Comment: Performed at [facility], [address] [place], [place] [address]  Lactic acid, plasma     Status: None   Collection Time: 12/21/17 10:47 PM  Result Value Ref Range   Lactic Acid, Venous 1.8 0.5 - 1.9 mmol/L    Comment: Performed at [facility], [address] [place], [place] [address]  Culture, blood (Routine x 2)     Status: None (Preliminary result)   Collection Time: 12/21/17 10:47 PM  Result Value Ref Range   Specimen Description BLOOD BLOOD LEFT FOREARM    Special Requests      BOTTLES DRAWN AEROBIC AND ANAEROBIC Blood Culture adequate volume   Culture      NO GROWTH < 12 HOURS Performed at [facility], [address],  [place], [place] [address]    Report Status PENDING   Culture, blood (Routine x 2)     Status: None (Preliminary result)   Collection Time: 12/22/17  4:24 AM  Result Value Ref Range   Specimen Description BLOOD RIGHT HAND    Special Requests      BOTTLES DRAWN AEROBIC AND ANAEROBIC Blood Culture adequate volume   Culture      NO GROWTH <12 HOURS Performed at [facility], [address] [place], [place] [address]    Report Status PENDING   Protime-INR     Status: None   Collection Time: 12/22/17  4:24 AM  Result Value Ref Range   Prothrombin Time 13.7 11.4 - 15.2 seconds   INR 1.06      Comment: Performed at [facility], [address] [place], [place] [address]  Lactic acid, plasma     Status: None   Collection Time: 12/22/17  4:24 AM  Result Value Ref Range   Lactic Acid, Venous 1.2 0.5 - 1.9 mmol/L    Comment: Performed at [facility], [address] [place], [place] [address]   Dg Chest 2 View  Result Date: 12/21/2017 CLINICAL DATA:  Patient reports upper chest pain (points to base of neck) all day that has gotten worse over the days. Hx/o asthma, carotid arterial disease, CHF, COPD, PAD, and diabetes Smoker EXAM: CHEST - 2 VIEW COMPARISON:  11/21/2017 FINDINGS: Mild hyperinflation. Midline trachea. Normal heart size. Atherosclerosis in the transverse aorta. No pleural effusion or pneumothorax. Diffuse peribronchial thickening. Clear lungs. IMPRESSION: No acute cardiopulmonary disease. Hyperinflation and mild interstitial thickening, most consistent with COPD/chronic bronchitis. Aortic Atherosclerosis (ICD10-I70.0). Electronically Signed   By: Abigail Miyamoto M.D.   On: 12/21/2017 23:35   Ct Chest Wo Contrast  Result Date: 12/22/2017 CLINICAL DATA:  57 year old male with chest pain and shortness of breath. EXAM: CT CHEST WITHOUT CONTRAST TECHNIQUE: Multidetector CT imaging of the chest was performed following the standard protocol without IV contrast. COMPARISON:  Chest radiograph dated 12/21/2017 and CT dated 04/22/2017 FINDINGS: Evaluation of this exam is limited in the absence of intravenous contrast. Cardiovascular: Top-normal cardiac size. No pericardial effusion. Advanced multi vessel coronary vascular calcification. There is mild atherosclerotic calcification of the thoracic aorta. The aorta and central pulmonary arteries are otherwise grossly unremarkable on this noncontrast CT. Mediastinum/Nodes: No hilar or mediastinal adenopathy. Esophagus and the thyroid gland are grossly unremarkable. No mediastinal fluid collection. Lungs/Pleura: Bilateral  ground-glass nodular densities most consistent with pneumonia, likely atypical in etiology. Clinical correlation and follow-up to resolution recommended. No focal consolidation, pleural effusion, or pneumothorax. Upper Abdomen: No acute abnormality. Musculoskeletal: Degenerative changes of the spine. No acute osseous pathology. IMPRESSION: 1. Innumerable small ground-glass pulmonary nodules most consistent with an inflammatory/infectious etiology. Clinical correlation and follow-up to resolution recommended. 2. Advanced multi vessel coronary vascular calcification. 3.  Aortic Atherosclerosis (ICD10-I70.0). Electronically Signed   By: Anner Crete M.D.   On: 12/22/2017 05:37   Dg Foot Complete Right  Result Date: 12/22/2017 CLINICAL DATA:  57 year old male with diabetic ulcer on the plantar right foot. EXAM: RIGHT FOOT COMPLETE - 3+ VIEW COMPARISON:  Right foot radiograph dated 07/10/2016 FINDINGS: There is no acute fracture or dislocation. Degenerative changes of the midfoot primarily involving the third tarsometatarsal joint. No periosteal reaction or erosive changes to suggest acute osteomyelitis. There is a large ulcer of the lateral aspect of the plantar soft tissues of the midfoot over the base of the fifth metatarsal  which appears larger compared to the prior radiograph. There is diffuse soft tissue edema. No radiopaque foreign object. IMPRESSION: 1. Large soft tissue ulcer at the lateral plantar midfoot. 2. No acute fracture or dislocation. No radiographic evidence of acute osteomyelitis. MRI or a WBC nuclear scan may provide better evaluation if there is high clinical concern for acute osteomyelitis. Electronically Signed   By: Anner Crete M.D.   On: 12/22/2017 05:42    Review of Systems  Constitutional: Negative for chills and fever.  HENT: Negative for sore throat and tinnitus.   Eyes: Negative for blurred vision and redness.  Respiratory: Positive for shortness of breath. Negative for  cough.   Cardiovascular: Positive for chest pain. Negative for palpitations, orthopnea and PND.  Gastrointestinal: Negative for abdominal pain, diarrhea, nausea and vomiting.  Genitourinary: Negative for dysuria, frequency and urgency.  Musculoskeletal: Negative for joint pain and myalgias.  Skin: Negative for rash.       No lesions  Neurological: Negative for speech change, focal weakness and weakness.  Endo/Heme/Allergies: Does not bruise/bleed easily.       No temperature intolerance  Psychiatric/Behavioral: Negative for depression and suicidal ideas.    Blood pressure 126/80, pulse (!) 101, temperature 98.2 F (36.8 C), temperature source Oral, resp. rate 18, height '5\' 7"'$  (1.702 m), weight 79.4 kg (175 lb), SpO2 99 %. Physical Exam  Vitals reviewed. Constitutional: He is oriented to person, place, and time. He appears well-developed and well-nourished. No distress.  HENT:  Head: Normocephalic and atraumatic.  Mouth/Throat: Oropharynx is clear and moist.  Eyes: Pupils are equal, round, and reactive to light. Conjunctivae and EOM are normal. No scleral icterus.  Neck: Normal range of motion. Neck supple. No JVD present. No tracheal deviation present. No thyromegaly present.  Cardiovascular: Normal rate, regular rhythm and normal heart sounds. Exam reveals no gallop and no friction rub.  No murmur heard. Respiratory: Effort normal and breath sounds normal. No respiratory distress.  GI: Soft. Bowel sounds are normal. He exhibits no distension. There is no tenderness.  Genitourinary:  Genitourinary Comments: Deferred  Musculoskeletal: Normal range of motion. He exhibits no edema.  Lymphadenopathy:    He has no cervical adenopathy.  Neurological: He is alert and oriented to person, place, and time. No cranial nerve deficit.  Skin: Skin is warm and dry. No rash noted. No erythema.  Psychiatric: He has a normal mood and affect. His behavior is normal. Judgment and thought content  normal.     Assessment/Plan This is a 57 year old male admitted for sepsis. 1.  Sepsis: The patient meets criteria via tachypnea and leukocytosis.  Source is likely diabetic wound and/or pneumonia.  CT of chest shows round glass opacities consistent with atypical pneumonia.  Continue Vanco and Zosyn.  Add with azithromycin for atypical coverage.  The patient is hemodynamically stable.  Follow blood cultures for growth and sensitivities. 2.  Diabetes mellitus type 2: With neurologic applications.  Diabetic foot ulcer.  Wound consult placed.  Schedule basal insulin (home dose was PRN).  Sliding scale insulin while hospitalized.  Hold oral hypoglycemic agents.  Gabapentin for neuropathy 3.  CHF: Chronic; combined systolic and diastolic.  Continue Lasix as needed as well as carvedilol and lisinopril.  Chest pain is atypical; resolved. 4.  Hyponatremia: Differential diagnosis etiology includes atypical pneumonia/tickborne illness versus hyperglycemia or hypovolemic hyponatremia.  Hydrate with normal saline. 5.  Tobacco abuse: Continue nicotine patches 6.  DVT prophylaxis: Lovenox 7.  GI prophylaxis: None The patient is a  full code.  Time spent on admission orders and patient care approximately 45 minutes  Harrie Foreman, MD 12/22/2017, 6:08 AM

## 2017-12-22 NOTE — Plan of Care (Signed)
  Problem: Education: Goal: Knowledge of General Education information will improve Outcome: Progressing   Problem: Health Behavior/Discharge Planning: Goal: Ability to manage health-related needs will improve Outcome: Progressing   Problem: Clinical Measurements: Goal: Ability to maintain clinical measurements within normal limits will improve Outcome: Progressing Goal: Will remain free from infection Outcome: Progressing Goal: Diagnostic test results will improve Outcome: Progressing Goal: Respiratory complications will improve Outcome: Progressing Goal: Cardiovascular complication will be avoided Outcome: Progressing   Problem: Activity: Goal: Risk for activity intolerance will decrease Outcome: Progressing   Problem: Nutrition: Goal: Adequate nutrition will be maintained Outcome: Progressing   Problem: Coping: Goal: Level of anxiety will decrease Outcome: Progressing   Problem: Elimination: Goal: Will not experience complications related to bowel motility Outcome: Progressing Goal: Will not experience complications related to urinary retention Outcome: Progressing   Problem: Pain Managment: Goal: General experience of comfort will improve Outcome: Progressing   Problem: Safety: Goal: Ability to remain free from injury will improve Outcome: Progressing   Problem: Skin Integrity: Goal: Risk for impaired skin integrity will decrease Outcome: Progressing   Problem: Activity: Goal: Ability to tolerate increased activity will improve Outcome: Progressing   Problem: Clinical Measurements: Goal: Ability to maintain a body temperature in the normal range will improve Outcome: Progressing   Problem: Respiratory: Goal: Ability to maintain adequate ventilation will improve Outcome: Progressing Goal: Ability to maintain a clear airway will improve Outcome: Progressing

## 2017-12-22 NOTE — Progress Notes (Signed)
ANTIBIOTIC CONSULT NOTE - INITIAL  Pharmacy Consult for  Vancomycin, Zosyn  Indication: sepsis  Allergies  Allergen Reactions  . Dulaglutide Anaphylaxis, Diarrhea and Hives    Patient Measurements: Height: 5\' 7"  (170.2 cm) Weight: 175 lb (79.4 kg) IBW/kg (Calculated) : 66.1 Adjusted Body Weight:  71.42 kg   Vital Signs: Temp: 98.2 F (36.8 C) (07/15 0405) Temp Source: Oral (07/15 0405) BP: 126/80 (07/15 0530) Pulse Rate: 101 (07/15 0530) Intake/Output from previous day: 07/14 0701 - 07/15 0700 In: 50 [IV Piggyback:50] Out: -  Intake/Output from this shift: Total I/O In: 50 [IV Piggyback:50] Out: -   Labs: Recent Labs    12/21/17 2238  WBC 12.9*  HGB 11.9*  PLT 271  CREATININE 1.09   Estimated Creatinine Clearance: 77.3 mL/min (by C-G formula based on SCr of 1.09 mg/dL). No results for input(s): VANCOTROUGH, VANCOPEAK, VANCORANDOM, GENTTROUGH, GENTPEAK, GENTRANDOM, TOBRATROUGH, TOBRAPEAK, TOBRARND, AMIKACINPEAK, AMIKACINTROU, AMIKACIN in the last 72 hours.   Microbiology: Recent Results (from the past 720 hour(s))  Culture, blood (Routine x 2)     Status: None (Preliminary result)   Collection Time: 12/21/17 10:47 PM  Result Value Ref Range Status   Specimen Description BLOOD BLOOD LEFT FOREARM  Final   Special Requests   Final    BOTTLES DRAWN AEROBIC AND ANAEROBIC Blood Culture adequate volume   Culture   Final    NO GROWTH < 12 HOURS Performed at [facility], [address] [place], [place] [address]    Report Status PENDING  Incomplete  Culture, blood (Routine x 2)     Status: None (Preliminary result)   Collection Time: 12/22/17  4:24 AM  Result Value Ref Range Status   Specimen Description BLOOD RIGHT HAND  Final   Special Requests   Final    BOTTLES DRAWN AEROBIC AND ANAEROBIC Blood Culture adequate volume   Culture   Final    NO GROWTH <12 HOURS Performed at [facility], [address] [place], [place] [address]     Report Status PENDING  Incomplete    Medical History: Past Medical History:  Diagnosis Date  . Arthritis   . Asthma   . Atherosclerosis   . BPH (benign prostatic hyperplasia)   . Carotid arterial disease ([place])   . Charcot's joint of foot, right   . Chronic combined systolic (congestive) and diastolic (congestive) heart failure ([place])    a. 02/2013 EF 50% by LV gram; b. 04/2017 Echo: EF 25-30%. diff HK. Gr2 DD. Mod dil LA/RV. PASP 61mmHg.  [name] COPD (chronic obstructive pulmonary disease) ([place])   . Coronary artery disease    a. 2013 S/P PCI of LAD [facility]);  b. 03/2013 PCI: RCA 90p ( 2.5 x 23 mm DES); c. 02/2014 Cath: patent RCA stent->Med Rx; d. 04/2017 Cath: LM nl, LAd 30ost/p, 58m/d, D1 80ost, LCX 90p/m, OM1 85, RCA 70/20p, 79m, 70d->Referred for CT Surg-felt to be poor candidate.  . Diabetic neuropathy ([place])   . Diabetic ulcer of right foot ([place])   . Hernia   . Hyperlipidemia   . Hypertension   . Ischemic cardiomyopathy    a. 04/2017 Echo: EF 25-30%; b. 08/2017 Cardiac MRI ([facility]): EF 19%, sev glob HK. RVEF 20%, mod BAE, triv MR, mild-mod TR. Basal lateral subendocardial infarct (viable), inf/infsept, dist septal ischemia, basal to mid lat peri-infarct ischemia.  . Morbid obesity ([place])   . Neuropathy   . PAD (peripheral artery disease) ([place])    a. Followed by Dr. Lucky Cowboy;  b. 08/2010 Periph Angio: RSFA 70-80p (6X60 self-expanding stent); c. 09/2010 Periph Angio: L SFA 100p (7x unknown length self-expanding stent); d. 06/2015 Periph Angio: R SFA short segment occlusion (6x12 self-expanding stent).  . Restless leg syndrome   . Sleep apnea   . Subclavian artery stenosis, right ([place])   . Syncope and collapse   . Tobacco use    a. 75+ yr hx - still smoking 1ppd, down from 2 ppd.  . Type II diabetes mellitus ([place])   . Varicose vein     Medications:  Scheduled:   Assessment: CrCl = 77.3 ml/min Ke = 0.069 hr-1 T1/2 = 10 hrs Vd = 50 L    Goal of Therapy:  Vancomycin trough level 15-20  mcg/ml  Plan:  Zosyn 3.375 gm IV X 1 given in ED on 7/15 @ 0437. Zosyn 3.375 gm IV Q8H EI ordered to start on 7/15 @ 10:00.  Vancomycin 1 gm IV X 1 given in ED on 7/15 @ 0511. Vancomycin 1 gm IV Q12H ordered to start on 7/15 @ 11:00, ~ 6 hrs after 1st dose (stacked dosing).  This pt will reach [facility] by 7/17 @ 0500.  Will draw 1st trough on 7/17 @ 10:30, which will be at [facility].   Lanard Arguijo D 12/22/2017,5:45 AM

## 2017-12-22 NOTE — ED Notes (Signed)
Pt waiting patiently in family wait room with visitors

## 2017-12-23 ENCOUNTER — Inpatient Hospital Stay: Payer: Medicaid Other

## 2017-12-23 LAB — CBC
HCT: 33.4 % — ABNORMAL LOW (ref 40.0–52.0)
Hemoglobin: 11.6 g/dL — ABNORMAL LOW (ref 13.0–18.0)
MCH: 28.4 pg (ref 26.0–34.0)
MCHC: 34.8 g/dL (ref 32.0–36.0)
MCV: 81.6 fL (ref 80.0–100.0)
Platelets: 230 10*3/uL (ref 150–440)
RBC: 4.1 MIL/uL — ABNORMAL LOW (ref 4.40–5.90)
RDW: 21.4 % — ABNORMAL HIGH (ref 11.5–14.5)
WBC: 7.9 10*3/uL (ref 3.8–10.6)

## 2017-12-23 LAB — BASIC METABOLIC PANEL
Anion gap: 6 (ref 5–15)
BUN: 16 mg/dL (ref 6–20)
CO2: 30 mmol/L (ref 22–32)
Calcium: 9 mg/dL (ref 8.9–10.3)
Chloride: 99 mmol/L (ref 98–111)
Creatinine, Ser: 0.86 mg/dL (ref 0.61–1.24)
GFR calc Af Amer: >60 mL/min (ref >60)
GFR calc non Af Amer: >60 mL/min (ref >60)
Glucose, Bld: 300 mg/dL — ABNORMAL HIGH (ref 70–99)
Potassium: 5.4 mmol/L — ABNORMAL HIGH (ref 3.5–5.1)
Sodium: 135 mmol/L (ref 135–145)

## 2017-12-23 LAB — GLUCOSE, CAPILLARY
Glucose-Capillary: 128 mg/dL — ABNORMAL HIGH (ref 70–99)
Glucose-Capillary: 208 mg/dL — ABNORMAL HIGH (ref 70–99)
Glucose-Capillary: 297 mg/dL — ABNORMAL HIGH (ref 70–99)
Glucose-Capillary: 336 mg/dL — ABNORMAL HIGH (ref 70–99)

## 2017-12-23 MED ORDER — INSULIN GLARGINE 100 UNIT/ML SOLOSTAR PEN: 70.0000 [IU] | PEN_INJECTOR | Freq: Every day | SUBCUTANEOUS | 11 refills | Status: DC

## 2017-12-23 MED ORDER — INSULIN REGULAR HUMAN 100 UNIT/ML IJ SOLN
10.0000 [IU] | Freq: Once | INTRAMUSCULAR | Status: AC
  Administered 2017-12-23: 10 [IU] via INTRAVENOUS
  Filled 2017-12-23: qty 0.1

## 2017-12-23 MED ORDER — INSULIN REGULAR HUMAN 100 UNIT/ML IJ SOLN
5.0000 [IU] | Freq: Once | INTRAMUSCULAR | Status: AC
  Administered 2017-12-23: 5 [IU] via INTRAVENOUS
  Filled 2017-12-23: qty 0.05

## 2017-12-23 MED ORDER — PREDNISONE 10 MG (21) PO TBPK: ORAL_TABLET | ORAL | 0 refills | Status: DC

## 2017-12-23 MED ORDER — DOXYCYCLINE HYCLATE 100 MG PO CAPS: 100.0000 mg | ORAL_CAPSULE | Freq: Two times a day (BID) | ORAL | 0 refills | Status: DC

## 2017-12-23 NOTE — Progress Notes (Signed)
[facility] [place] NAME: [name] [name]    MR#:  [id number]  DATE OF BIRTH:  Jan 11, 1961  SUBJECTIVE:  CHIEF COMPLAINT:   Chief Complaint  Patient presents with  . Chest Pain   - breathing is improved, weaned off oxygen - no fevers, wbc is within normal limits  REVIEW OF SYSTEMS:  Review of Systems  Constitutional: Positive for malaise/fatigue. Negative for chills and fever.  HENT: Negative for congestion, ear discharge, hearing loss and nosebleeds.   Eyes: Negative for blurred vision and double vision.  Respiratory: Negative for cough, shortness of breath and wheezing.   Cardiovascular: Negative for chest pain, palpitations and leg swelling.  Gastrointestinal: Negative for abdominal pain, constipation, diarrhea, nausea and vomiting.  Genitourinary: Negative for dysuria.  Musculoskeletal: Positive for joint pain and myalgias.  Neurological: Negative for dizziness, focal weakness, seizures, weakness and headaches.  Psychiatric/Behavioral: Negative for depression.    DRUG ALLERGIES:   Allergies  Allergen Reactions  . Dulaglutide Anaphylaxis, Diarrhea and Hives    VITALS:  Blood pressure 134/76, pulse 87, temperature 97.9 F (36.6 C), temperature source Oral, resp. rate 18, height 5\' 7"  (1.702 m), weight 105.9 kg (233 lb 7.5 oz), SpO2 100 %.  PHYSICAL EXAMINATION:  Physical Exam   GENERAL:  57 y.o.-year-old patient lying in the bed with no acute distress.  EYES: Pupils equal, round, reactive to light and accommodation. No scleral icterus. Extraocular muscles intact.  HEENT: Head atraumatic, normocephalic. Oropharynx and nasopharynx clear.  NECK:  Supple, no jugular venous distention. No thyroid enlargement, no tenderness.  LUNGS: moving air bilaterally, scattered wheezing, no rales,rhonchi or crepitation. No use of accessory muscles of respiration.  CARDIOVASCULAR: S1, S2 normal. No murmurs, rubs, or gallops.  ABDOMEN: Soft,  nontender, nondistended. Bowel sounds present. No organomegaly or mass.  EXTREMITIES: No pedal edema, cyanosis, or clubbing. Right foot dressing in place for a plantar ulcer NEUROLOGIC: Cranial nerves II through XII are intact. Muscle strength 5/5 in all extremities. Sensation intact. Gait not checked.  PSYCHIATRIC: The patient is alert and oriented x 3.  SKIN: No obvious rash, lesion, or ulcer.    LABORATORY PANEL:   CBC Recent Labs  Lab 12/23/17 0306  WBC 7.9  HGB 11.6*  HCT 33.4*  PLT 230   ------------------------------------------------------------------------------------------------------------------  Chemistries  Recent Labs  Lab 12/21/17 2238 12/23/17 0306  NA 130* 135  K 4.8 5.4*  CL 94* 99  CO2 28 30  GLUCOSE 206* 300*  BUN 16 16  CREATININE 1.09 0.86  CALCIUM 9.1 9.0  AST 19  --   ALT 12  --   ALKPHOS 86  --   BILITOT 1.1  --    ------------------------------------------------------------------------------------------------------------------  Cardiac Enzymes No results for input(s): TROPONINI in the last 168 hours. ------------------------------------------------------------------------------------------------------------------  RADIOLOGY:  Dg Chest 2 View  Result Date: 12/21/2017 CLINICAL DATA:  Patient reports upper chest pain (points to base of neck) all day that has gotten worse over the days. Hx/o asthma, carotid arterial disease, CHF, COPD, PAD, and diabetes Smoker EXAM: CHEST - 2 VIEW COMPARISON:  11/21/2017 FINDINGS: Mild hyperinflation. Midline trachea. Normal heart size. Atherosclerosis in the transverse aorta. No pleural effusion or pneumothorax. Diffuse peribronchial thickening. Clear lungs. IMPRESSION: No acute cardiopulmonary disease. Hyperinflation and mild interstitial thickening, most consistent with COPD/chronic bronchitis. Aortic Atherosclerosis (ICD10-I70.0). Electronically Signed   By: Abigail Miyamoto M.D.   On: 12/21/2017 23:35   Ct Chest  Wo Contrast  Result Date: 12/22/2017  CLINICAL DATA:  57 year old male with chest pain and shortness of breath. EXAM: CT CHEST WITHOUT CONTRAST TECHNIQUE: Multidetector CT imaging of the chest was performed following the standard protocol without IV contrast. COMPARISON:  Chest radiograph dated 12/21/2017 and CT dated 04/22/2017 FINDINGS: Evaluation of this exam is limited in the absence of intravenous contrast. Cardiovascular: Top-normal cardiac size. No pericardial effusion. Advanced multi vessel coronary vascular calcification. There is mild atherosclerotic calcification of the thoracic aorta. The aorta and central pulmonary arteries are otherwise grossly unremarkable on this noncontrast CT. Mediastinum/Nodes: No hilar or mediastinal adenopathy. Esophagus and the thyroid gland are grossly unremarkable. No mediastinal fluid collection. Lungs/Pleura: Bilateral ground-glass nodular densities most consistent with pneumonia, likely atypical in etiology. Clinical correlation and follow-up to resolution recommended. No focal consolidation, pleural effusion, or pneumothorax. Upper Abdomen: No acute abnormality. Musculoskeletal: Degenerative changes of the spine. No acute osseous pathology. IMPRESSION: 1. Innumerable small ground-glass pulmonary nodules most consistent with an inflammatory/infectious etiology. Clinical correlation and follow-up to resolution recommended. 2. Advanced multi vessel coronary vascular calcification. 3.  Aortic Atherosclerosis (ICD10-I70.0). Electronically Signed   By: Anner Crete M.D.   On: 12/22/2017 05:37   Dg Foot Complete Right  Result Date: 12/22/2017 CLINICAL DATA:  57 year old male with diabetic ulcer on the plantar right foot. EXAM: RIGHT FOOT COMPLETE - 3+ VIEW COMPARISON:  Right foot radiograph dated 07/10/2016 FINDINGS: There is no acute fracture or dislocation. Degenerative changes of the midfoot primarily involving the third tarsometatarsal joint. No periosteal reaction  or erosive changes to suggest acute osteomyelitis. There is a large ulcer of the lateral aspect of the plantar soft tissues of the midfoot over the base of the fifth metatarsal which appears larger compared to the prior radiograph. There is diffuse soft tissue edema. No radiopaque foreign object. IMPRESSION: 1. Large soft tissue ulcer at the lateral plantar midfoot. 2. No acute fracture or dislocation. No radiographic evidence of acute osteomyelitis. MRI or a WBC nuclear scan may provide better evaluation if there is high clinical concern for acute osteomyelitis. Electronically Signed   By: Anner Crete M.D.   On: 12/22/2017 05:42    EKG:   Orders placed or performed during the hospital encounter of 12/22/17  . EKG 12-Lead  . EKG 12-Lead  . ED EKG 12-Lead  . ED EKG 12-Lead    ASSESSMENT AND PLAN:   57 year old male with past medical history significant for diabetes mellitus with Charcot joints, hypertension, cardiomyopathy, peripheral neuropathy, peripheral arterial disease presents to hospital secondary to fever and shortness of breath  1.  Sepsis-secondary to pneumonitis and  infection of diabetic foot ulcer -Blood cultures are negative. -Appreciate wound care nurse consult, dressing in place.  - MRI of the foot ordered per podiatry consult- await further input -on vancomycin and Zosyn- will narrow antibiotics after MRI and podiatry input  2.  Acute hypoxic respiratory failure-secondary to COPD exacerbation and also pneumonitis -On antibiotics. -also on steroids and duo nebs. -Counseled against smoking.   on nicotine patch - weaned off oxygen  3. DM- lantus, SSI  4. CAD- stable- asa, plavix, coreg, statin- hold lisnopril due to hyperkalemia today  5. DVT Prophylaxis- lovenox  PT consult- no needs identified. Possible discharge today or tomorrow based on MRI results and podiatry recommendations    All the records are reviewed and case discussed with Care Management/Social  Workerr. Management plans discussed with the patient, family and they are in agreement.  CODE STATUS: Full Code  TOTAL TIME TAKING CARE OF THIS  PATIENT: 38 minutes.   POSSIBLE D/C TODAY OR TOMORROW, DEPENDING ON CLINICAL CONDITION.   Gladstone Lighter M.D on 12/23/2017 at 8:50 AM  Between 7am to 6pm - Pager - [phone]  After 6pm go to [email or web address] - password [email or web address] [facility]  Office  [phone]  CC: Primary care physician; Clarisse Gouge, MD

## 2017-12-23 NOTE — Discharge Instructions (Signed)
1.  Daily wet-to-dry dressing changes to right foot 2.  Nonweightbearing of right foot 3.  F/u with podiatry within 1 week for debridement and wound VAC placement

## 2017-12-23 NOTE — Consult Note (Signed)
[facility]                                                      Patient Demographics  [name] [name], is a 57 y.o. male   MRN: [id number]   DOB - [birth year]  Admit Date - 12/22/2017    Outpatient Primary MD for the patient is Clarisse Gouge, MD  Consult requested in the Hospital by Gladstone Lighter, MD, On 12/23/2017    Reason for consult diabetic ulcer right foot   With History of -  Past Medical History:  Diagnosis Date  . Arthritis   . Asthma   . Atherosclerosis   . BPH (benign prostatic hyperplasia)   . Carotid arterial disease ([place])   . Charcot's joint of foot, right   . Chronic combined systolic (congestive) and diastolic (congestive) heart failure ([place])    a. 02/2013 EF 50% by LV gram; b. 04/2017 Echo: EF 25-30%. diff HK. Gr2 DD. Mod dil LA/RV. PASP 56mmHg.  [name] COPD (chronic obstructive pulmonary disease) ([place])   . Coronary artery disease    a. 2013 S/P PCI of LAD [facility]);  b. 03/2013 PCI: RCA 90p ( 2.5 x 23 mm DES); c. 02/2014 Cath: patent RCA stent->Med Rx; d. 04/2017 Cath: LM nl, LAd 30ost/p, 74m/d, D1 80ost, LCX 90p/m, OM1 85, RCA 70/20p, 39m, 70d->Referred for CT Surg-felt to be poor candidate.  . Diabetic neuropathy ([place])   . Diabetic ulcer of right foot ([place])   . Hernia   . Hyperlipidemia   . Hypertension   . Ischemic cardiomyopathy    a. 04/2017 Echo: EF 25-30%; b. 08/2017 Cardiac MRI ([facility]): EF 19%, sev glob HK. RVEF 20%, mod BAE, triv MR, mild-mod TR. Basal lateral subendocardial infarct (viable), inf/infsept, dist septal ischemia, basal to mid lat peri-infarct ischemia.  . Morbid obesity ([place])   . Neuropathy   . PAD (peripheral artery disease) ([place])    a. Followed by Dr. Lucky Cowboy; b. 08/2010 Periph Angio: RSFA 70-80p (6X60 self-expanding stent); c. 09/2010 Periph Angio: L SFA 100p (7x unknown length self-expanding stent); d. 06/2015 Periph  Angio: R SFA short segment occlusion (6x12 self-expanding stent).  . Restless leg syndrome   . Sleep apnea   . Subclavian artery stenosis, right ([place])   . Syncope and collapse   . Tobacco use    a. 75+ yr hx - still smoking 1ppd, down from 2 ppd.  . Type II diabetes mellitus ([place])   . Varicose vein       Past Surgical History:  Procedure Laterality Date  . ABDOMINAL AORTAGRAM N/A 06/23/2013   Procedure: ABDOMINAL Maxcine Ham;  Surgeon: Wellington Hampshire, MD;  Location: [place] CATH LAB;  Service: Cardiovascular;  Laterality: N/A;  . APPENDECTOMY    . CARDIAC CATHETERIZATION  10/14   [facility]; X1 STENT PROXIMAL RCA  . CARDIAC CATHETERIZATION  02/2010   [facility]  . CARDIAC CATHETERIZATION  02/21/2014   [facility]  . CLAVICLE SURGERY    . HERNIA REPAIR    . LEFT HEART CATH AND CORS/GRAFTS ANGIOGRAPHY N/A 04/28/2017   Procedure: LEFT HEART CATH AND CORONARY ANGIOGRAPHY;  Surgeon: Wellington Hampshire, MD;  Location: [place] CV LAB;  Service: Cardiovascular;  Laterality: N/A;  . PERIPHERAL ARTERIAL STENT GRAFT     x2 left/right  . PERIPHERAL VASCULAR CATHETERIZATION N/A 07/06/2015  Procedure: Abdominal Aortogram w/Lower Extremity;  Surgeon: Algernon Huxley, MD;  Location: [place] CV LAB;  Service: Cardiovascular;  Laterality: N/A;  . PERIPHERAL VASCULAR CATHETERIZATION  07/06/2015   Procedure: Lower Extremity Intervention;  Surgeon: Algernon Huxley, MD;  Location: [place] CV LAB;  Service: Cardiovascular;;    in for   Chief Complaint  Patient presents with  . Chest Pain     HPI  [name] [name]  is a 57 y.o. male, patient states he had a wound there for about 5 years.  He states is been followed at the wound care center at [facility] Dr. Doristine Devoid who just transseptal today once in a while and put a bandage on but he states that he has a boot that he uses.  But even with the boot he still walks on puts pressure on it.  He has not had any kind of wound VAC therapy.  He also goes to [facility] for some cardiac  issues.    Review of Systems    In addition to the HPI above,  No Fever-chills, No Headache, No changes with Vision or hearing, No problems swallowing food or Liquids, No Chest pain, Cough or Shortness of Breath, No Abdominal pain, No Nausea or Vommitting, Bowel movements are regular, No Blood in stool or Urine, No dysuria, No new skin rashes or bruises, No new joints pains-aches,  No new weakness, tingling, numbness in any extremity, No recent weight gain or loss, No polyuria, polydypsia or polyphagia, No significant Mental Stressors.  A full 10 point Review of Systems was done, except as stated above, all other Review of Systems were negative.   Social History Social History   Tobacco Use  . Smoking status: Current Every Day Smoker    Packs/day: 1.00    Years: 41.00    Pack years: 41.00    Types: Cigarettes  . Smokeless tobacco: Never Used  Substance Use Topics  . Alcohol use: No    Family History Family History  Problem Relation Age of Onset  . Heart attack Father   . Heart disease Father   . Hypertension Father   . Hyperlipidemia Father   . Hypertension Mother   . Hyperlipidemia Mother     Prior to Admission medications   Medication Sig Start Date End Date Taking? Authorizing Provider  albuterol (PROVENTIL HFA;VENTOLIN HFA) 108 (90 BASE) MCG/ACT inhaler Inhale 2 puffs into the lungs every 6 (six) hours as needed for wheezing or shortness of breath. 04/15/13  Yes Wellington Hampshire, MD  aspirin EC 81 MG tablet Take 1 tablet (81 mg total) by mouth daily. 05/11/15  Yes Wellington Hampshire, MD  atorvastatin (LIPITOR) 80 MG tablet Take 80 mg by mouth daily.   Yes [provider]  carvedilol (COREG) 6.25 MG tablet Take 1 tablet by mouth 2 (two) times daily. 09/10/17 09/10/18 Yes [provider]  clopidogrel (PLAVIX) 75 MG tablet Take 1 tablet by mouth daily. 08/22/17  Yes [provider]  furosemide (LASIX) 40 MG tablet Take 1 tablet (40 mg  total) 2 (two) times daily by mouth. Patient taking differently: Take 40 mg by mouth daily as needed for fluid.  04/29/17 04/29/18 Yes Max Sane, MD  gabapentin (NEURONTIN) 800 MG tablet Take 1 tablet by mouth 4 (four) times daily. 08/14/17 08/14/18 Yes [provider]  Insulin Glargine (LANTUS SOLOSTAR) 100 UNIT/ML Solostar Pen Inject 40-80 Units into the skin 2 (two) times daily as needed. 80 in am; 40 units at  night 01/21/17  Yes [provider]  insulin lispro (HUMALOG) 100 UNIT/ML injection Inject 0-10 Units into the skin 3 (three) times daily with meals. If BS is 0 to 70 no insulin and eat/glucose tablets/glucose get If BS 71 to 150 great! If BS 151 to 200, 2 Units Humaog If BS 201 to 250, 4 Units Humalog If BS 251 to 300, 6 Units Humalog If BS 301 to 350, 8 Units Humalog If BS 351 to 400, 10 Units Humalog If BS greater than 400, take 10 Units and Contact our office during office hour   Yes [provider]  ipratropium-albuterol (DUONEB) 0.5-2.5 (3) MG/3ML SOLN Inhale 3 mLs into the lungs every 6 (six) hours as needed (wheezing/sob).    Yes [provider]  lisinopril (PRINIVIL,ZESTRIL) 5 MG tablet Take 1 tablet by mouth daily. 09/10/17 09/10/18 Yes [provider]  metFORMIN (GLUCOPHAGE-XR) 500 MG 24 hr tablet Take 500 mg by mouth daily with breakfast.   Yes [provider]  nitroGLYCERIN (NITRODUR - DOSED IN MG/24 HR) 0.4 mg/hr patch Place 1 patch onto the skin daily. Changes patch every 12 hours 08/14/17  Yes [provider]  nitroGLYCERIN (NITROSTAT) 0.4 MG SL tablet Place 0.4 mg under the tongue every 5 (five) minutes as needed for chest pain.   Yes [provider]  oxyCODONE (OXY IR/ROXICODONE) 5 MG immediate release tablet Take 5-10 mg by mouth every 6 (six) hours as needed for severe pain.    Yes [provider]  traZODone (DESYREL) 50 MG tablet Take 1 tablet by mouth at bedtime. 08/12/17  Yes [provider]  nicotine (NICODERM CQ - DOSED IN MG/24 HOURS) 14 mg/24hr patch Place 1 patch (14 mg total) onto the skin daily. 11/23/17   Salary, Avel Peace, MD    Anti-infectives (From admission, onward)   Start     Dose/Rate Route Frequency Ordered Stop   12/22/17 1100  piperacillin-tazobactam (ZOSYN) IVPB 3.375 g     3.375 g 12.5 mL/hr over 240 Minutes Intravenous Every 8 hours 12/22/17 0535     12/22/17 1100  vancomycin (VANCOCIN) IVPB 1000 mg/200 mL premix     1,000 mg 200 mL/hr over 60 Minutes Intravenous Every 12 hours 12/22/17 0544     12/22/17 0415  piperacillin-tazobactam (ZOSYN) IVPB 3.375 g     3.375 g 100 mL/hr over 30 Minutes Intravenous  Once 12/22/17 0413 12/22/17 0509   12/22/17 0415  vancomycin (VANCOCIN) IVPB 1000 mg/200 mL premix     1,000 mg 200 mL/hr over 60 Minutes Intravenous  Once 12/22/17 0413 12/22/17 0611      Scheduled Meds: . aspirin EC  81 mg Oral Daily  . atorvastatin  80 mg Oral Daily  . carvedilol  6.25 mg Oral BID  . clopidogrel  75 mg Oral Daily  . docusate sodium  100 mg Oral BID  . enoxaparin (LOVENOX) injection  40 mg Subcutaneous Q24H  . gabapentin  800 mg Oral QID  . insulin aspart  0-15 Units Subcutaneous TID WC  . insulin glargine  60 Units Subcutaneous QHS  . methylPREDNISolone (SOLU-MEDROL) injection  40 mg Intravenous QAC breakfast  . nicotine  14 mg Transdermal Daily  . nitroGLYCERIN  0.4 mg Transdermal Daily  . traZODone  50 mg Oral QHS   Continuous Infusions: . piperacillin-tazobactam (ZOSYN)  IV 3.375 g (12/23/17 1235)  . vancomycin 1,000 mg (12/23/17 1235)   PRN Meds:.acetaminophen **OR** acetaminophen, furosemide, guaiFENesin, ipratropium-albuterol, nitroGLYCERIN, ondansetron **OR** ondansetron (ZOFRAN) IV,  oxyCODONE  Allergies  Allergen Reactions  . Dulaglutide Anaphylaxis, Diarrhea and Hives    Physical Exam  Vitals  Blood pressure 134/76, pulse 87, temperature 97.9 F (36.6 C), temperature source Oral, resp. rate 18, height  5\' 7"  (1.702 m), weight 105.9 kg (233 lb 7.5 oz), SpO2 100 %.  Lower Extremity exam:  Vascular: Diminished bilateral  Dermatological: Patient has a fairly large ulceration underneath the fifth metatarsal tuberosity and base.  Approximately 5.8 cm in length and about 4 cm in width with 1.2 cm of depth.  Centrally and wound there is what appears to be some necrosis of the tendon and some mild tissue necrosis of the distal portion but for the most part the wound is very granular.  No evidence of cellulitis sinus tracking or purulent drainage is noted.  Neurological: Obvious peripheral neuropathy secondary to diabetes  Ortho: Cavovarus foot type patient typically would put a lot of pressure on the lateral side of his foot when he walks that is enhancing the amount of stress on the wound.  Data Review  CBC Recent Labs  Lab 12/21/17 2238 12/23/17 0306  WBC 12.9* 7.9  HGB 11.9* 11.6*  HCT 34.0* 33.4*  PLT 271 230  MCV 80.2 81.6  MCH 28.1 28.4  MCHC 35.1 34.8  RDW 21.4* 21.4*  LYMPHSABS 0.8*  --   MONOABS 0.6  --   EOSABS 0.0  --   BASOSABS 0.0  --    ------------------------------------------------------------------------------------------------------------------  Chemistries  Recent Labs  Lab 12/21/17 2238 12/23/17 0306  NA 130* 135  K 4.8 5.4*  CL 94* 99  CO2 28 30  GLUCOSE 206* 300*  BUN 16 16  CREATININE 1.09 0.86  CALCIUM 9.1 9.0  AST 19  --   ALT 12  --   ALKPHOS 86  --   BILITOT 1.1  --    ------------------------------------------------------------------------------------------------------------------ estimated creatinine clearance is 112.6 mL/min (by C-G formula based on SCr of 0.86 mg/dL). ------------------------------------------------------------------------------------------------------------------ Recent Labs    12/21/17 2238  TSH 0.533   Urinalysis    Component Value Date/Time   COLORURINE STRAW (A) 12/22/2017 0424   APPEARANCEUR CLEAR (A)  12/22/2017 0424   APPEARANCEUR Clear 02/06/2014 1412   LABSPEC 1.006 12/22/2017 0424   LABSPEC 1.015 02/06/2014 1412   PHURINE 6.0 12/22/2017 0424   GLUCOSEU NEGATIVE 12/22/2017 0424   GLUCOSEU Negative 02/06/2014 1412   HGBUR MODERATE (A) 12/22/2017 0424   BILIRUBINUR NEGATIVE 12/22/2017 0424   BILIRUBINUR Negative 02/06/2014 1412   KETONESUR NEGATIVE 12/22/2017 0424   PROTEINUR NEGATIVE 12/22/2017 0424   NITRITE NEGATIVE 12/22/2017 0424   LEUKOCYTESUR NEGATIVE 12/22/2017 0424   LEUKOCYTESUR Negative 02/06/2014 1412     Imaging results:   Dg Chest 2 View  Result Date: 12/21/2017 CLINICAL DATA:  Patient reports upper chest pain (points to base of neck) all day that has gotten worse over the days. Hx/o asthma, carotid arterial disease, CHF, COPD, PAD, and diabetes Smoker EXAM: CHEST - 2 VIEW COMPARISON:  11/21/2017 FINDINGS: Mild hyperinflation. Midline trachea. Normal heart size. Atherosclerosis in the transverse aorta. No pleural effusion or pneumothorax. Diffuse peribronchial thickening. Clear lungs. IMPRESSION: No acute cardiopulmonary disease. Hyperinflation and mild interstitial thickening, most consistent with COPD/chronic bronchitis. Aortic Atherosclerosis (ICD10-I70.0). Electronically Signed   By: Abigail Miyamoto M.D.   On: 12/21/2017 23:35   Ct Chest Wo Contrast  Result Date: 12/22/2017 CLINICAL DATA:  57 year old male with chest pain and shortness of breath. EXAM: CT CHEST WITHOUT CONTRAST TECHNIQUE: Multidetector CT imaging  of the chest was performed following the standard protocol without IV contrast. COMPARISON:  Chest radiograph dated 12/21/2017 and CT dated 04/22/2017 FINDINGS: Evaluation of this exam is limited in the absence of intravenous contrast. Cardiovascular: Top-normal cardiac size. No pericardial effusion. Advanced multi vessel coronary vascular calcification. There is mild atherosclerotic calcification of the thoracic aorta. The aorta and central pulmonary arteries  are otherwise grossly unremarkable on this noncontrast CT. Mediastinum/Nodes: No hilar or mediastinal adenopathy. Esophagus and the thyroid gland are grossly unremarkable. No mediastinal fluid collection. Lungs/Pleura: Bilateral ground-glass nodular densities most consistent with pneumonia, likely atypical in etiology. Clinical correlation and follow-up to resolution recommended. No focal consolidation, pleural effusion, or pneumothorax. Upper Abdomen: No acute abnormality. Musculoskeletal: Degenerative changes of the spine. No acute osseous pathology. IMPRESSION: 1. Innumerable small ground-glass pulmonary nodules most consistent with an inflammatory/infectious etiology. Clinical correlation and follow-up to resolution recommended. 2. Advanced multi vessel coronary vascular calcification. 3.  Aortic Atherosclerosis (ICD10-I70.0). Electronically Signed   By: Anner Crete M.D.   On: 12/22/2017 05:37   Mr Foot Right Wo Contrast  Result Date: 12/23/2017 CLINICAL DATA:  Diabetic patient with a chronic skin ulceration on the lateral aspect of the right foot at the base of the fifth metatarsal. EXAM: MRI OF THE RIGHT FOREFOOT WITHOUT CONTRAST TECHNIQUE: Multiplanar, multisequence MR imaging of the right forefoot was performed. No intravenous contrast was administered. COMPARISON:  Plain films right foot 12/22/2017 and 07/10/2016. MRI right foot 05/06/2016 FINDINGS: Bones/Joint/Cartilage Marrow edema in the cuboid is most intense in the lateral 2 cm. There is intense marrow edema throughout the fifth metatarsal. Marrow edema is also identified in the fourth metatarsal and most intense in the proximal 2 cm. Calcaneocuboid joint effusion is noted. Marrow edema about the remaining tarsometatarsal joints is likely related to neuropathic change. Subchondral cysts are seen at the articulation of the navicular and medial cuneiform. No fracture. Ligaments Intact. Muscles and Tendons Atrophy of intrinsic musculature the  foot is identified. No intramuscular fluid collection. Soft tissues A subcutaneous fluid collection at the base of the fifth metatarsal deep to a skin ulceration measures 0.7 cm craniocaudal by 0.4 cm transverse by 1.1 cm long. IMPRESSION: Skin ulceration at the base of the fifth metatarsal with marrow edema in the lateral 2 cm of the cuboid and throughout the fifth metatarsal consistent with osteomyelitis. Marrow edema is also seen throughout almost the entire fourth metatarsal but most intense in the proximal 2 cm where it is worrisome for osteomyelitis. Edema in the remainder of the fourth metatarsal may be reactive. Calcaneocuboid joint effusion worrisome for septic joint. Small fluid collection lateral to the base of the fifth metatarsal is likely an abscess. Charcot change of the midfoot. Electronically Signed   By: Inge Rise M.D.   On: 12/23/2017 12:23   Dg Foot Complete Right  Result Date: 12/22/2017 CLINICAL DATA:  56 year old male with diabetic ulcer on the plantar right foot. EXAM: RIGHT FOOT COMPLETE - 3+ VIEW COMPARISON:  Right foot radiograph dated 07/10/2016 FINDINGS: There is no acute fracture or dislocation. Degenerative changes of the midfoot primarily involving the third tarsometatarsal joint. No periosteal reaction or erosive changes to suggest acute osteomyelitis. There is a large ulcer of the lateral aspect of the plantar soft tissues of the midfoot over the base of the fifth metatarsal which appears larger compared to the prior radiograph. There is diffuse soft tissue edema. No radiopaque foreign object. IMPRESSION: 1. Large soft tissue ulcer at the lateral plantar midfoot. 2. No  acute fracture or dislocation. No radiographic evidence of acute osteomyelitis. MRI or a WBC nuclear scan may provide better evaluation if there is high clinical concern for acute osteomyelitis. Electronically Signed   By: Anner Crete M.D.   On: 12/22/2017 05:42    Assessment & Plan: MRI showed some  marrow edema but this could very well likely be due to stress on the bone since these loss soft tissue and is been bearing weight on the area it could cause a chronic increased signal on T2 images.  No other evidence of infection is noted to the region there is no drainage or apparent sinus tracts to the region.  Significant granular tissue was noted in the wound although wound is fairly large.  Today I will go ahead and redressed the foot and explained to him adamantly that he needs to stay completely nonweightbearing on this if he wants to get it healed up.  Also feel like he needs debridement of the necrotic tissue in the wound and likely a wound VAC to help promote healing to the region.  Apparently they have not done any of this at [facility] just treatment periodically and have him continue to dress it.  States he is weightbearing on it most of the time.  He states he does have a scooter at home and I told him to use it and also use crutches and stay completely nonweightbearing.  We can follow him up next week or the week after that in my office and at some point we can do the debridement with an outpatient wound VAC application.  I would keep him on oral antibiotics at this point.  Because of the long-standing wound I would use a doxycycline or sulfa antibiotics. Active Problems:   Sepsis [facility])   Family Communication: Plan discussed with patient   Albertine Patricia M.D on 12/23/2017 at 1:05 PM  Thank you for the consult, we will follow the patient with you in the Hospital.

## 2017-12-23 NOTE — Discharge Summary (Signed)
[facility] [place] NAME: [name] [name]    MR#:  [id number]  DATE OF BIRTH:  Apr 22, 1961  DATE OF ADMISSION:  12/22/2017   ADMITTING PHYSICIAN: Harrie Foreman, MD  DATE OF DISCHARGE: 12/23/17  PRIMARY CARE PHYSICIAN: Clarisse Gouge, MD   ADMISSION DIAGNOSIS:   Sepsis, due to unspecified organism [facility]) [A41.9]  DISCHARGE DIAGNOSIS:   Active Problems:   Sepsis ([place])   SECONDARY DIAGNOSIS:   Past Medical History:  Diagnosis Date  . Arthritis   . Asthma   . Atherosclerosis   . BPH (benign prostatic hyperplasia)   . Carotid arterial disease ([place])   . Charcot's joint of foot, right   . Chronic combined systolic (congestive) and diastolic (congestive) heart failure ([place])    a. 02/2013 EF 50% by LV gram; b. 04/2017 Echo: EF 25-30%. diff HK. Gr2 DD. Mod dil LA/RV. PASP 50mmHg.  [name] COPD (chronic obstructive pulmonary disease) ([place])   . Coronary artery disease    a. 2013 S/P PCI of LAD [facility]);  b. 03/2013 PCI: RCA 90p ( 2.5 x 23 mm DES); c. 02/2014 Cath: patent RCA stent->Med Rx; d. 04/2017 Cath: LM nl, LAd 30ost/p, 25m/d, D1 80ost, LCX 90p/m, OM1 85, RCA 70/20p, 1m, 70d->Referred for CT Surg-felt to be poor candidate.  . Diabetic neuropathy ([place])   . Diabetic ulcer of right foot ([place])   . Hernia   . Hyperlipidemia   . Hypertension   . Ischemic cardiomyopathy    a. 04/2017 Echo: EF 25-30%; b. 08/2017 Cardiac MRI ([facility]): EF 19%, sev glob HK. RVEF 20%, mod BAE, triv MR, mild-mod TR. Basal lateral subendocardial infarct (viable), inf/infsept, dist septal ischemia, basal to mid lat peri-infarct ischemia.  . Morbid obesity ([place])   . Neuropathy   . PAD (peripheral artery disease) ([place])    a. Followed by Dr. Lucky Cowboy; b. 08/2010 Periph Angio: RSFA 70-80p (6X60 self-expanding stent); c. 09/2010 Periph Angio: L SFA 100p (7x unknown length self-expanding stent); d. 06/2015 Periph Angio: R SFA short segment occlusion (6x12 self-expanding stent).  .  Restless leg syndrome   . Sleep apnea   . Subclavian artery stenosis, right ([place])   . Syncope and collapse   . Tobacco use    a. 75+ yr hx - still smoking 1ppd, down from 2 ppd.  . Type II diabetes mellitus ([place])   . Varicose vein     HOSPITAL COURSE:   57 year old male with past medical history significant for diabetes mellitus with Charcot joints, hypertension, cardiomyopathy, peripheral neuropathy, peripheral arterial disease presents to hospital secondary to fever and shortness of breath  1.  Sepsis-secondary to pneumonitis and  infection of diabetic foot ulcer -Blood cultures are negative. -Appreciate wound care nurse consult, dressing in place. -X ray Negative for osteomyelitis - appreciate Podiatry consult- MRI ordered   for the right foot showing marrow edema and consistent with osteomyelitis.  Per podiatry it could be chronic secondary to stress on the bone and loss of soft tissue and constant weightbearing. -No active infection noted, no drainage or sinus tracts in the region. -Patient received vancomycin and Zosyn in the hospital.  Will be discharged on doxycycline. -Patient will need possible wound debridement and wound VAC placement for better healing.  He will follow-up with podiatry next week.  Advised strongly nonweightbearing on that foot.  Patient has a scooter, can also use crutches. -Dressing change with wet-to-dry dressings every day.  We will set up home health  nursing  2.  Acute hypoxic respiratory failure-secondary to COPD exacerbation and also pneumonitis -On antibiotics. -on steroids and duo nebs. -Counseled against smoking.  on nicotine patch - weaned off oxygen while in the hospital  3. DM- lantus, SSI as outpatient  4. CAD- stable- asa, plavix, coreg, statin and lisinopril  5.  Hyperkalemia-health lisinopril today due to borderline hyperkalemia.  Monitor as outpatient  PT consult- no needs identified.    DISCHARGE CONDITIONS:    Guarded  CONSULTS OBTAINED:   Podiatry consult by Dr. Elvina Mattes  DRUG ALLERGIES:   Allergies  Allergen Reactions  . Dulaglutide Anaphylaxis, Diarrhea and Hives   DISCHARGE MEDICATIONS:   Allergies as of 12/23/2017      Reactions   Dulaglutide Anaphylaxis, Diarrhea, Hives      Medication List    TAKE these medications   albuterol 108 (90 Base) MCG/ACT inhaler Commonly known as:  PROVENTIL HFA;VENTOLIN HFA Inhale 2 puffs into the lungs every 6 (six) hours as needed for wheezing or shortness of breath.   aspirin EC 81 MG tablet Take 1 tablet (81 mg total) by mouth daily.   atorvastatin 80 MG tablet Commonly known as:  LIPITOR Take 80 mg by mouth daily.   carvedilol 6.25 MG tablet Commonly known as:  COREG Take 1 tablet by mouth 2 (two) times daily.   clopidogrel 75 MG tablet Commonly known as:  PLAVIX Take 1 tablet by mouth daily.   doxycycline 100 MG capsule Commonly known as:  VIBRAMYCIN Take 1 capsule (100 mg total) by mouth 2 (two) times daily for 14 days.   furosemide 40 MG tablet Commonly known as:  LASIX Take 1 tablet (40 mg total) 2 (two) times daily by mouth. What changed:    when to take this  reasons to take this   gabapentin 800 MG tablet Commonly known as:  NEURONTIN Take 1 tablet by mouth 4 (four) times daily.   Insulin Glargine 100 UNIT/ML Solostar Pen Commonly known as:  LANTUS SOLOSTAR Inject 70 Units into the skin at bedtime. What changed:    how much to take  when to take this  reasons to take this  additional instructions   insulin lispro 100 UNIT/ML injection Commonly known as:  HUMALOG Inject 0-10 Units into the skin 3 (three) times daily with meals. If BS is 0 to 70 no insulin and eat/glucose tablets/glucose get If BS 71 to 150 great! If BS 151 to 200, 2 Units Humaog If BS 201 to 250, 4 Units Humalog If BS 251 to 300, 6 Units Humalog If BS 301 to 350, 8 Units Humalog If BS 351 to 400, 10 Units Humalog If BS greater  than 400, take 10 Units and Contact our office during office hour   ipratropium-albuterol 0.5-2.5 (3) MG/3ML Soln Commonly known as:  DUONEB Inhale 3 mLs into the lungs every 6 (six) hours as needed (wheezing/sob).   lisinopril 5 MG tablet Commonly known as:  PRINIVIL,ZESTRIL Take 1 tablet by mouth daily.   metFORMIN 500 MG 24 hr tablet Commonly known as:  GLUCOPHAGE-XR Take 500 mg by mouth daily with breakfast.   nicotine 14 mg/24hr patch Commonly known as:  NICODERM CQ - dosed in mg/24 hours Place 1 patch (14 mg total) onto the skin daily.   nitroGLYCERIN 0.4 MG SL tablet Commonly known as:  NITROSTAT Place 0.4 mg under the tongue every 5 (five) minutes as needed for chest pain.   nitroGLYCERIN 0.4 mg/hr patch Commonly known as:  NITRODUR -  Dosed in mg/24 hr Place 1 patch onto the skin daily. Changes patch every 12 hours   oxyCODONE 5 MG immediate release tablet Commonly known as:  Oxy IR/ROXICODONE Take 5-10 mg by mouth every 6 (six) hours as needed for severe pain.   predniSONE 10 MG (21) Tbpk tablet Commonly known as:  STERAPRED UNI-PAK 21 TAB 6 tabs PO x 1 day 5 tabs PO x 1 day 4 tabs PO x 1 day 3 tabs PO x 1 day 2 tabs PO x 1 day 1 tab PO x 1 day and stop   traZODone 50 MG tablet Commonly known as:  DESYREL Take 1 tablet by mouth at bedtime.        DISCHARGE INSTRUCTIONS:   1. PCP f/u in 1-2 weeks 2. Podiatry follow up in 1 week  DIET:   Diabetic diet  ACTIVITY:   Activity as tolerated  OXYGEN:   Home Oxygen: No.  Oxygen Delivery: room air  DISCHARGE LOCATION:   home   If you experience worsening of your admission symptoms, develop shortness of breath, life threatening emergency, suicidal or homicidal thoughts you must seek medical attention immediately by calling 911 or calling your MD immediately  if symptoms less severe.  You Must read complete instructions/literature along with all the possible adverse reactions/side effects for all  the Medicines you take and that have been prescribed to you. Take any new Medicines after you have completely understood and accpet all the possible adverse reactions/side effects.   Please note  You were cared for by a hospitalist during your hospital stay. If you have any questions about your discharge medications or the care you received while you were in the hospital after you are discharged, you can call the unit and asked to speak with the hospitalist on call if the hospitalist that took care of you is not available. Once you are discharged, your primary care physician will handle any further medical issues. Please note that NO REFILLS for any discharge medications will be authorized once you are discharged, as it is imperative that you return to your primary care physician (or establish a relationship with a primary care physician if you do not have one) for your aftercare needs so that they can reassess your need for medications and monitor your lab values.    On the day of Discharge:  VITAL SIGNS:   Blood pressure 134/76, pulse 87, temperature 97.9 F (36.6 C), temperature source Oral, resp. rate 18, height 5\' 7"  (1.702 m), weight 105.9 kg (233 lb 7.5 oz), SpO2 100 %.  PHYSICAL EXAMINATION:    GENERAL:  57 y.o.-year-old patient lying in the bed with no acute distress.  EYES: Pupils equal, round, reactive to light and accommodation. No scleral icterus. Extraocular muscles intact.  HEENT: Head atraumatic, normocephalic. Oropharynx and nasopharynx clear.  NECK:  Supple, no jugular venous distention. No thyroid enlargement, no tenderness.  LUNGS: moving air bilaterally, scattered wheezing, no rales,rhonchi or crepitation. No use of accessory muscles of respiration.  CARDIOVASCULAR: S1, S2 normal. No murmurs, rubs, or gallops.  ABDOMEN: Soft, nontender, nondistended. Bowel sounds present. No organomegaly or mass.  EXTREMITIES: No pedal edema, cyanosis, or clubbing. Right foot dressing in  place for a plantar ulcer NEUROLOGIC: Cranial nerves II through XII are intact. Muscle strength 5/5 in all extremities. Sensation intact. Gait not checked.  PSYCHIATRIC: The patient is alert and oriented x 3.  SKIN: No obvious rash, lesion, or ulcer.    DATA REVIEW:   CBC  Recent Labs  Lab 12/23/17 0306  WBC 7.9  HGB 11.6*  HCT 33.4*  PLT 230    Chemistries  Recent Labs  Lab 12/21/17 2238 12/23/17 0306  NA 130* 135  K 4.8 5.4*  CL 94* 99  CO2 28 30  GLUCOSE 206* 300*  BUN 16 16  CREATININE 1.09 0.86  CALCIUM 9.1 9.0  AST 19  --   ALT 12  --   ALKPHOS 86  --   BILITOT 1.1  --      Microbiology Results  Results for orders placed or performed during the hospital encounter of 12/22/17  Culture, blood (Routine x 2)     Status: None (Preliminary result)   Collection Time: 12/21/17 10:47 PM  Result Value Ref Range Status   Specimen Description BLOOD BLOOD LEFT FOREARM  Final   Special Requests   Final    BOTTLES DRAWN AEROBIC AND ANAEROBIC Blood Culture adequate volume   Culture   Final    NO GROWTH 2 DAYS Performed at [facility], [address], [place], [place] [address]    Report Status PENDING  Incomplete  Culture, blood (Routine x 2)     Status: None (Preliminary result)   Collection Time: 12/22/17  4:24 AM  Result Value Ref Range Status   Specimen Description BLOOD RIGHT HAND  Final   Special Requests   Final    BOTTLES DRAWN AEROBIC AND ANAEROBIC Blood Culture adequate volume   Culture   Final    NO GROWTH 1 DAY Performed at [facility], [address], [place], [place] [address]    Report Status PENDING  Incomplete    RADIOLOGY:  Mr Foot Right Wo Contrast  Result Date: 12/23/2017 CLINICAL DATA:  Diabetic patient with a chronic skin ulceration on the lateral aspect of the right foot at the base of the fifth metatarsal. EXAM: MRI OF THE RIGHT FOREFOOT WITHOUT CONTRAST TECHNIQUE: Multiplanar, multisequence MR imaging of the right  forefoot was performed. No intravenous contrast was administered. COMPARISON:  Plain films right foot 12/22/2017 and 07/10/2016. MRI right foot 05/06/2016 FINDINGS: Bones/Joint/Cartilage Marrow edema in the cuboid is most intense in the lateral 2 cm. There is intense marrow edema throughout the fifth metatarsal. Marrow edema is also identified in the fourth metatarsal and most intense in the proximal 2 cm. Calcaneocuboid joint effusion is noted. Marrow edema about the remaining tarsometatarsal joints is likely related to neuropathic change. Subchondral cysts are seen at the articulation of the navicular and medial cuneiform. No fracture. Ligaments Intact. Muscles and Tendons Atrophy of intrinsic musculature the foot is identified. No intramuscular fluid collection. Soft tissues A subcutaneous fluid collection at the base of the fifth metatarsal deep to a skin ulceration measures 0.7 cm craniocaudal by 0.4 cm transverse by 1.1 cm long. IMPRESSION: Skin ulceration at the base of the fifth metatarsal with marrow edema in the lateral 2 cm of the cuboid and throughout the fifth metatarsal consistent with osteomyelitis. Marrow edema is also seen throughout almost the entire fourth metatarsal but most intense in the proximal 2 cm where it is worrisome for osteomyelitis. Edema in the remainder of the fourth metatarsal may be reactive. Calcaneocuboid joint effusion worrisome for septic joint. Small fluid collection lateral to the base of the fifth metatarsal is likely an abscess. Charcot change of the midfoot. Electronically Signed   By: Inge Rise M.D.   On: 12/23/2017 12:23     Management plans discussed with the patient, family and  they are in agreement.  CODE STATUS:     Code Status Orders  (From admission, onward)        Start     Ordered   12/22/17 0608  Full code  Continuous     12/22/17 0607    Code Status History    Date Active Date Inactive Code Status Order ID Comments User Context    11/21/2017 0415 11/23/2017 1444 Full Code [id number]  Lance Coon, MD Inpatient   04/23/2017 0355 04/29/2017 1353 Full Code [id number]  Saundra Shelling, MD Inpatient   07/11/2016 0324 07/11/2016 1851 Full Code [id number]  Saundra Shelling, MD Inpatient   05/06/2016 0005 05/07/2016 1417 Full Code [id number]  Harvie Bridge, DO Inpatient   11/21/2015 1716 11/22/2015 1950 Full Code [id number]  Gladstone Lighter, MD Inpatient   07/06/2015 0948 07/06/2015 1437 Full Code [id number]  Algernon Huxley, MD Inpatient      TOTAL TIME TAKING CARE OF THIS PATIENT: 38 minutes.    Gladstone Lighter M.D on 12/23/2017 at 2:25 PM  Between 7am to 6pm - Pager - [phone]  After 6pm go to [email or web address] - Technical brewer [facility]  Office  [phone]  CC: Primary care physician; Clarisse Gouge, MD   Note: This dictation was prepared with Dragon dictation along with smaller phrase technology. Any transcriptional errors that result from this process are unintentional.

## 2017-12-23 NOTE — Evaluation (Signed)
Physical Therapy Evaluation Patient Details Name: [name] [name] MRN: [id number] DOB: 02/03/1961 Today's Date: 12/23/2017   History of Present Illness  57 y/o male here with diagnosis of sepsis.  He has a long standing diabetic R foot ulcer, he has used CAM boot and SPC for ~2 years 2/2 this.   Clinical Impression  Pt confident and safe with all aspects of PT assessment.  He was able to sit up and don walking boot w/o issue and was able to circumambulate the nurses' station w/o issue showing good speed and confidence.  He has some baseline pain in R foot secondary to chronic wound, but nothing new or outstanding.  He should be safe to return home once medically cleared, not further PT needs at this time.   Follow Up Recommendations No PT follow up    Equipment Recommendations  None recommended by PT    Recommendations for Other Services       Precautions / Restrictions Precautions Precautions: None Required Braces or Orthoses: (cam boot during ambulation) Restrictions Weight Bearing Restrictions: No      Mobility  Bed Mobility Overal bed mobility: Independent                Transfers Overall transfer level: Independent               General transfer comment: Pt able to rise to standing w/o assist, maintained balance w/o AD  Ambulation/Gait Ambulation/Gait assistance: Independent Gait Distance (Feet): 200 Feet Assistive device: Rolling walker (2 wheeled)       General Gait Details: Pt used rolling walker for ambulation but showed no reliance on it and essentially walked with great speed, safety and confidence and had no issues.   Stairs            Wheelchair Mobility    Modified Rankin (Stroke Patients Only)       Balance Overall balance assessment: Independent                                           Pertinent Vitals/Pain Pain Assessment: (chronic foot pain)    Home Living Family/patient expects to be discharged to::  Private residence Living Arrangements: Spouse/significant other Available Help at Discharge: Family   Home Access: Stairs to enter   Technical brewer of Steps: 1 Home Layout: One level Home Equipment: Walker - standard;Cane - single point;Wheelchair - manual      Prior Function Level of Independence: Independent         Comments: Pt using boot all the time, on AD in the home, SPC outside the home     Hand Dominance        Extremity/Trunk Assessment   Upper Extremity Assessment Upper Extremity Assessment: Overall WFL for tasks assessed    Lower Extremity Assessment Lower Extremity Assessment: Overall WFL for tasks assessed       Communication   Communication: No difficulties  Cognition Arousal/Alertness: Awake/alert Behavior During Therapy: WFL for tasks assessed/performed Overall Cognitive Status: Within Functional Limits for tasks assessed                                        General Comments      Exercises     Assessment/Plan    PT Assessment Patent does not need  any further PT services  PT Problem List         PT Treatment Interventions      PT Goals (Current goals can be found in the Care Plan section)  Acute Rehab PT Goals Patient Stated Goal: go home today PT Goal Formulation: All assessment and education complete, DC therapy    Frequency     Barriers to discharge        Co-evaluation               AM-PAC PT "6 Clicks" Daily Activity  Outcome Measure Difficulty turning over in bed (including adjusting bedclothes, sheets and blankets)?: None Difficulty moving from lying on back to sitting on the side of the bed? : None Difficulty sitting down on and standing up from a chair with arms (e.g., wheelchair, bedside commode, etc,.)?: None Help needed moving to and from a bed to chair (including a wheelchair)?: None Help needed walking in hospital room?: None Help needed climbing 3-5 steps with a railing? :  None 6 Click Score: 24    End of Session Equipment Utilized During Treatment: Gait belt Activity Tolerance: Patient tolerated treatment well Patient left: in chair;with call bell/phone within reach Nurse Communication: Mobility status PT Visit Diagnosis: Muscle weakness (generalized) (M62.81);Difficulty in walking, not elsewhere classified (R26.2)    Time: 2094-7096 PT Time Calculation (min) (ACUTE ONLY): 26 min   Charges:   PT Evaluation $PT Eval Low Complexity: 1 Low     PT G CodesKreg Shropshire, DPT 12/23/2017, 11:12 AM

## 2017-12-26 ENCOUNTER — Telehealth: Payer: Self-pay

## 2017-12-26 LAB — BLOOD CULTURE ID PANEL (REFLEXED)

## 2017-12-26 NOTE — Progress Notes (Signed)
Pharmacy Note  Lab called with blood culture positive (on Day 5) with 1/4 bottles in anaerobic bottle with gram positive coccobacillus. Nothing detected on the BCID. Tech said the bottle would be sent to [facility] lab for further culture. Culture report called to clinical pharmacist.

## 2017-12-26 NOTE — Telephone Encounter (Signed)
Flagged on EMMI report for not taking meds.  Called and spoke with patient.  He mentioned he was discharged on prednisone, however taking it has caused his lips to blister/breakout.  He has since stopped taking it.  He states he did call his PCP to make them aware. He does have follow up appointments next week with both PCP and his foot doctor.  Encouraged him to call his PCP should he notice any changes in condition with his allergic reaction.  No other questions or concerns at this time.  I thanked him for his time and informed him that he would receive one more automated call checking on him in the next few days.

## 2017-12-26 NOTE — Progress Notes (Signed)
Pharmacy Note  Lab called with blood culture positive (on Day 5) with 1/4 bottles in anaerobic bottle with gram positive coccobacillus. Nothing detected on the BCID. Tech said the bottle would be sent to [facility] lab for further culture. Culture report called to clinical pharmacist.  Message sent to discharging MD via Epic. Current rounder notified as well.  Sim Boast, PharmD, BCPS  12/26/17 3:12 PM

## 2017-12-27 LAB — CULTURE, BLOOD (ROUTINE X 2)
Culture: NO GROWTH
Special Requests: ADEQUATE

## 2017-12-29 ENCOUNTER — Telehealth: Payer: Self-pay | Admitting: Licensed Clinical Social Worker

## 2017-12-29 LAB — CULTURE, BLOOD (ROUTINE X 2): Special Requests: ADEQUATE

## 2017-12-29 NOTE — Telephone Encounter (Signed)
EMMI flagged patient for answering yes to feeling sad/hopeless/anxious/empty. Clinical Education officer, museum (CSW) attempted to contact patient twice today via telephone however it went straight to voicemail and the voicemail was not set up, so a message could not be left.   [employer], LCSW [phone]

## 2017-12-30 NOTE — Telephone Encounter (Signed)
EMMI flagged patient for answering yes to feeling sad/hopeless/anxious/empty. Clinical Education officer, museum (CSW) was able to reach patient via telephone on the second attempt. Per patient he is frustrated because he is in pain and can't do things for himself like he use to, which is causing anxiety and depression. Patient reported that he is not interested in going to see a counselor because he feels like he does not need it. Per patient he is not having thoughts of hurting himself. CSW provided emotional support. Per patient he is going to see his doctor on Thursday. Patient reported no other needs or concerns.   [employer], LCSW [phone]

## 2018-01-02 ENCOUNTER — Emergency Department: Payer: Medicaid Other

## 2018-01-02 ENCOUNTER — Other Ambulatory Visit: Payer: Self-pay

## 2018-01-02 ENCOUNTER — Inpatient Hospital Stay
Admission: EM | Admit: 2018-01-02 | Discharge: 2018-01-09 | DRG: 853 | Disposition: A | Discharge status: Home Health | Payer: Medicaid Other | Attending: Internal Medicine | Admitting: Internal Medicine

## 2018-01-02 DIAGNOSIS — A401 Sepsis due to streptococcus, group B: Principal | ICD-10-CM | POA: Diagnosis present

## 2018-01-02 DIAGNOSIS — I70203 Unspecified atherosclerosis of native arteries of extremities, bilateral legs: Secondary | ICD-10-CM | POA: Diagnosis present

## 2018-01-02 DIAGNOSIS — E11628 Type 2 diabetes mellitus with other skin complications: Secondary | ICD-10-CM | POA: Diagnosis present

## 2018-01-02 DIAGNOSIS — E11621 Type 2 diabetes mellitus with foot ulcer: Secondary | ICD-10-CM | POA: Diagnosis present

## 2018-01-02 DIAGNOSIS — L97509 Non-pressure chronic ulcer of other part of unspecified foot with unspecified severity: Secondary | ICD-10-CM | POA: Diagnosis not present

## 2018-01-02 DIAGNOSIS — Z79899 Other long term (current) drug therapy: Secondary | ICD-10-CM

## 2018-01-02 DIAGNOSIS — M869 Osteomyelitis, unspecified: Secondary | ICD-10-CM | POA: Diagnosis present

## 2018-01-02 DIAGNOSIS — I255 Ischemic cardiomyopathy: Secondary | ICD-10-CM | POA: Diagnosis present

## 2018-01-02 DIAGNOSIS — E1122 Type 2 diabetes mellitus with diabetic chronic kidney disease: Secondary | ICD-10-CM | POA: Diagnosis present

## 2018-01-02 DIAGNOSIS — I2510 Atherosclerotic heart disease of native coronary artery without angina pectoris: Secondary | ICD-10-CM | POA: Diagnosis present

## 2018-01-02 DIAGNOSIS — I214 Non-ST elevation (NSTEMI) myocardial infarction: Secondary | ICD-10-CM | POA: Diagnosis present

## 2018-01-02 DIAGNOSIS — R748 Abnormal levels of other serum enzymes: Secondary | ICD-10-CM | POA: Diagnosis not present

## 2018-01-02 DIAGNOSIS — E669 Obesity, unspecified: Secondary | ICD-10-CM | POA: Diagnosis present

## 2018-01-02 DIAGNOSIS — J449 Chronic obstructive pulmonary disease, unspecified: Secondary | ICD-10-CM | POA: Diagnosis present

## 2018-01-02 DIAGNOSIS — I70238 Atherosclerosis of native arteries of right leg with ulceration of other part of lower right leg: Secondary | ICD-10-CM | POA: Diagnosis not present

## 2018-01-02 DIAGNOSIS — L0889 Other specified local infections of the skin and subcutaneous tissue: Secondary | ICD-10-CM | POA: Diagnosis present

## 2018-01-02 DIAGNOSIS — I130 Hypertensive heart and chronic kidney disease with heart failure and stage 1 through stage 4 chronic kidney disease, or unspecified chronic kidney disease: Secondary | ICD-10-CM | POA: Diagnosis present

## 2018-01-02 DIAGNOSIS — E1151 Type 2 diabetes mellitus with diabetic peripheral angiopathy without gangrene: Secondary | ICD-10-CM | POA: Diagnosis present

## 2018-01-02 DIAGNOSIS — I739 Peripheral vascular disease, unspecified: Secondary | ICD-10-CM | POA: Diagnosis not present

## 2018-01-02 DIAGNOSIS — Z888 Allergy status to other drugs, medicaments and biological substances status: Secondary | ICD-10-CM | POA: Diagnosis not present

## 2018-01-02 DIAGNOSIS — R0602 Shortness of breath: Secondary | ICD-10-CM

## 2018-01-02 DIAGNOSIS — F17210 Nicotine dependence, cigarettes, uncomplicated: Secondary | ICD-10-CM | POA: Diagnosis present

## 2018-01-02 DIAGNOSIS — I708 Atherosclerosis of other arteries: Secondary | ICD-10-CM | POA: Diagnosis present

## 2018-01-02 DIAGNOSIS — R531 Weakness: Secondary | ICD-10-CM | POA: Diagnosis present

## 2018-01-02 DIAGNOSIS — I5022 Chronic systolic (congestive) heart failure: Secondary | ICD-10-CM | POA: Diagnosis present

## 2018-01-02 DIAGNOSIS — I248 Other forms of acute ischemic heart disease: Secondary | ICD-10-CM | POA: Diagnosis present

## 2018-01-02 DIAGNOSIS — Z794 Long term (current) use of insulin: Secondary | ICD-10-CM

## 2018-01-02 DIAGNOSIS — E118 Type 2 diabetes mellitus with unspecified complications: Secondary | ICD-10-CM

## 2018-01-02 DIAGNOSIS — L97519 Non-pressure chronic ulcer of other part of right foot with unspecified severity: Secondary | ICD-10-CM | POA: Diagnosis present

## 2018-01-02 DIAGNOSIS — I252 Old myocardial infarction: Secondary | ICD-10-CM

## 2018-01-02 DIAGNOSIS — N179 Acute kidney failure, unspecified: Secondary | ICD-10-CM | POA: Diagnosis present

## 2018-01-02 DIAGNOSIS — R778 Other specified abnormalities of plasma proteins: Secondary | ICD-10-CM

## 2018-01-02 DIAGNOSIS — G4730 Sleep apnea, unspecified: Secondary | ICD-10-CM | POA: Diagnosis present

## 2018-01-02 DIAGNOSIS — Z7982 Long term (current) use of aspirin: Secondary | ICD-10-CM

## 2018-01-02 DIAGNOSIS — E785 Hyperlipidemia, unspecified: Secondary | ICD-10-CM | POA: Diagnosis present

## 2018-01-02 DIAGNOSIS — A419 Sepsis, unspecified organism: Secondary | ICD-10-CM | POA: Diagnosis not present

## 2018-01-02 DIAGNOSIS — R7989 Other specified abnormal findings of blood chemistry: Secondary | ICD-10-CM

## 2018-01-02 DIAGNOSIS — E1169 Type 2 diabetes mellitus with other specified complication: Secondary | ICD-10-CM | POA: Diagnosis present

## 2018-01-02 DIAGNOSIS — L089 Local infection of the skin and subcutaneous tissue, unspecified: Secondary | ICD-10-CM | POA: Diagnosis not present

## 2018-01-02 DIAGNOSIS — L97514 Non-pressure chronic ulcer of other part of right foot with necrosis of bone: Secondary | ICD-10-CM | POA: Diagnosis present

## 2018-01-02 DIAGNOSIS — Z720 Tobacco use: Secondary | ICD-10-CM | POA: Diagnosis present

## 2018-01-02 DIAGNOSIS — E1140 Type 2 diabetes mellitus with diabetic neuropathy, unspecified: Secondary | ICD-10-CM | POA: Diagnosis present

## 2018-01-02 DIAGNOSIS — Z8249 Family history of ischemic heart disease and other diseases of the circulatory system: Secondary | ICD-10-CM

## 2018-01-02 DIAGNOSIS — Z955 Presence of coronary angioplasty implant and graft: Secondary | ICD-10-CM

## 2018-01-02 DIAGNOSIS — E11610 Type 2 diabetes mellitus with diabetic neuropathic arthropathy: Secondary | ICD-10-CM | POA: Diagnosis present

## 2018-01-02 DIAGNOSIS — Z7902 Long term (current) use of antithrombotics/antiplatelets: Secondary | ICD-10-CM

## 2018-01-02 DIAGNOSIS — N183 Chronic kidney disease, stage 3 (moderate): Secondary | ICD-10-CM | POA: Diagnosis present

## 2018-01-02 DIAGNOSIS — I10 Essential (primary) hypertension: Secondary | ICD-10-CM | POA: Diagnosis present

## 2018-01-02 DIAGNOSIS — Z6835 Body mass index (BMI) 35.0-35.9, adult: Secondary | ICD-10-CM

## 2018-01-02 DIAGNOSIS — I25118 Atherosclerotic heart disease of native coronary artery with other forms of angina pectoris: Secondary | ICD-10-CM | POA: Diagnosis present

## 2018-01-02 LAB — COMPREHENSIVE METABOLIC PANEL
ALT: 26 U/L (ref 0–44)
AST: 62 U/L — ABNORMAL HIGH (ref 15–41)
Albumin: 2.6 g/dL — ABNORMAL LOW (ref 3.5–5.0)
Alkaline Phosphatase: 60 U/L (ref 38–126)
Anion gap: 9 (ref 5–15)
BUN: 33 mg/dL — ABNORMAL HIGH (ref 6–20)
CO2: 30 mmol/L (ref 22–32)
Calcium: 8.5 mg/dL — ABNORMAL LOW (ref 8.9–10.3)
Chloride: 86 mmol/L — ABNORMAL LOW (ref 98–111)
Creatinine, Ser: 2.02 mg/dL — ABNORMAL HIGH (ref 0.61–1.24)
GFR calc Af Amer: 41 mL/min — ABNORMAL LOW (ref >60)
GFR calc non Af Amer: 35 mL/min — ABNORMAL LOW (ref >60)
Glucose, Bld: 472 mg/dL — ABNORMAL HIGH (ref 70–99)
Potassium: 5.4 mmol/L — ABNORMAL HIGH (ref 3.5–5.1)
Sodium: 125 mmol/L — ABNORMAL LOW (ref 135–145)
Total Bilirubin: 1.1 mg/dL (ref 0.3–1.2)
Total Protein: 7.1 g/dL (ref 6.5–8.1)

## 2018-01-02 LAB — CBC WITH DIFFERENTIAL/PLATELET
Basophils Absolute: 0 10*3/uL (ref 0–0.1)
Basophils Relative: 0 %
Eosinophils Absolute: 0 10*3/uL (ref 0–0.7)
Eosinophils Relative: 0 %
HCT: 26.7 % — ABNORMAL LOW (ref 40.0–52.0)
Hemoglobin: 9 g/dL — ABNORMAL LOW (ref 13.0–18.0)
Lymphocytes Relative: 4 %
Lymphs Abs: 0.8 10*3/uL — ABNORMAL LOW (ref 1.0–3.6)
MCH: 28.1 pg (ref 26.0–34.0)
MCHC: 33.5 g/dL (ref 32.0–36.0)
MCV: 83.9 fL (ref 80.0–100.0)
Monocytes Absolute: 1.5 10*3/uL — ABNORMAL HIGH (ref 0.2–1.0)
Monocytes Relative: 7 %
Neutro Abs: 18.2 10*3/uL — ABNORMAL HIGH (ref 1.4–6.5)
Neutrophils Relative %: 89 %
Platelets: 290 10*3/uL (ref 150–440)
RBC: 3.19 MIL/uL — ABNORMAL LOW (ref 4.40–5.90)
RDW: 18.4 % — ABNORMAL HIGH (ref 11.5–14.5)
WBC: 20.6 10*3/uL — ABNORMAL HIGH (ref 3.8–10.6)

## 2018-01-02 LAB — PROCALCITONIN: Procalcitonin: 0.94 ng/mL

## 2018-01-02 LAB — LACTIC ACID, PLASMA: Lactic Acid, Venous: 2.3 mmol/L (ref 0.5–1.9)

## 2018-01-02 LAB — PROTIME-INR
INR: 1.21
Prothrombin Time: 15.2 seconds (ref 11.4–15.2)

## 2018-01-02 LAB — TROPONIN I: Troponin I: 2.95 ng/mL (ref <0.03)

## 2018-01-02 LAB — LIPASE, BLOOD: Lipase: 27 U/L (ref 11–51)

## 2018-01-02 MED ORDER — SODIUM CHLORIDE 0.9 % IV SOLN
2.0000 g | Freq: Once | INTRAVENOUS | Status: AC
  Administered 2018-01-02: 2 g via INTRAVENOUS
  Filled 2018-01-02: qty 2

## 2018-01-02 MED ORDER — SODIUM CHLORIDE 0.9 % IV BOLUS
2000.0000 mL | Freq: Once | INTRAVENOUS | Status: AC
  Administered 2018-01-02: 2000 mL via INTRAVENOUS

## 2018-01-02 MED ORDER — VANCOMYCIN HCL IN DEXTROSE 1-5 GM/200ML-% IV SOLN
1000.0000 mg | Freq: Once | INTRAVENOUS | Status: AC
  Administered 2018-01-02: 1000 mg via INTRAVENOUS
  Filled 2018-01-02: qty 200

## 2018-01-02 MED ORDER — ACETAMINOPHEN 500 MG PO TABS
1000.0000 mg | ORAL_TABLET | Freq: Once | ORAL | Status: AC
  Administered 2018-01-02: 1000 mg via ORAL
  Filled 2018-01-02: qty 2

## 2018-01-02 MED ORDER — METRONIDAZOLE IN NACL 5-0.79 MG/ML-% IV SOLN
500.0000 mg | Freq: Once | INTRAVENOUS | Status: AC
  Administered 2018-01-02: 500 mg via INTRAVENOUS
  Filled 2018-01-02: qty 100

## 2018-01-02 NOTE — H&P (Signed)
[facility] NAME: [name] [name]    MR#:  [id number]  DATE OF BIRTH:  01-19-1961  DATE OF ADMISSION:  01/02/2018  PRIMARY CARE PHYSICIAN: Clarisse Gouge, MD   REQUESTING/REFERRING PHYSICIAN: Mable Paris, MD  CHIEF COMPLAINT:   Chief Complaint  Patient presents with  . Weakness    HISTORY OF PRESENT ILLNESS:  [name] [name]  is a 57 y.o. male who presents with confusion, chills, fever, right foot wound and erythema.  Patient meets sepsis criteria here in the ED.  He is unable to contribute reliable information to his HPI given his confusion.  Family states that he has been getting progressively worse for the past couple of days.  He has a right diabetic foot wound with surrounding erythema.  He was also found to have a significantly elevated troponin.  Hospitalist were called for admission  PAST MEDICAL HISTORY:   Past Medical History:  Diagnosis Date  . Arthritis   . Asthma   . Atherosclerosis   . BPH (benign prostatic hyperplasia)   . Carotid arterial disease ([place])   . Charcot's joint of foot, right   . Chronic combined systolic (congestive) and diastolic (congestive) heart failure ([place])    a. 02/2013 EF 50% by LV gram; b. 04/2017 Echo: EF 25-30%. diff HK. Gr2 DD. Mod dil LA/RV. PASP 67mmHg.  [name] COPD (chronic obstructive pulmonary disease) ([place])   . Coronary artery disease    a. 2013 S/P PCI of LAD [facility]);  b. 03/2013 PCI: RCA 90p ( 2.5 x 23 mm DES); c. 02/2014 Cath: patent RCA stent->Med Rx; d. 04/2017 Cath: LM nl, LAd 30ost/p, 69m/d, D1 80ost, LCX 90p/m, OM1 85, RCA 70/20p, 72m, 70d->Referred for CT Surg-felt to be poor candidate.  . Diabetic neuropathy ([place])   . Diabetic ulcer of right foot ([place])   . Hernia   . Hyperlipidemia   . Hypertension   . Ischemic cardiomyopathy    a. 04/2017 Echo: EF 25-30%; b. 08/2017 Cardiac MRI ([facility]): EF 19%, sev glob HK. RVEF 20%, mod BAE, triv MR, mild-mod TR. Basal lateral subendocardial  infarct (viable), inf/infsept, dist septal ischemia, basal to mid lat peri-infarct ischemia.  . Morbid obesity ([place])   . Neuropathy   . PAD (peripheral artery disease) ([place])    a. Followed by Dr. Lucky Cowboy; b. 08/2010 Periph Angio: RSFA 70-80p (6X60 self-expanding stent); c. 09/2010 Periph Angio: L SFA 100p (7x unknown length self-expanding stent); d. 06/2015 Periph Angio: R SFA short segment occlusion (6x12 self-expanding stent).  . Restless leg syndrome   . Sleep apnea   . Subclavian artery stenosis, right ([place])   . Syncope and collapse   . Tobacco use    a. 75+ yr hx - still smoking 1ppd, down from 2 ppd.  . Type II diabetes mellitus ([place])   . Varicose vein      PAST SURGICAL HISTORY:   Past Surgical History:  Procedure Laterality Date  . ABDOMINAL AORTAGRAM N/A 06/23/2013   Procedure: ABDOMINAL Maxcine Ham;  Surgeon: Wellington Hampshire, MD;  Location: [facility] CATH [facility];  Service: Cardiovascular;  Laterality: N/A;  . APPENDECTOMY    . CARDIAC CATHETERIZATION  10/14   [facility]; X1 STENT PROXIMAL RCA  . CARDIAC CATHETERIZATION  02/2010   [facility]  . CARDIAC CATHETERIZATION  02/21/2014   [facility]  . CLAVICLE SURGERY    . HERNIA REPAIR    . LEFT HEART CATH AND CORS/GRAFTS ANGIOGRAPHY N/A 04/28/2017   Procedure: LEFT HEART  CATH AND CORONARY ANGIOGRAPHY;  Surgeon: Wellington Hampshire, MD;  Location: [facility] CV [facility];  Service: Cardiovascular;  Laterality: N/A;  . PERIPHERAL ARTERIAL STENT GRAFT     x2 left/right  . PERIPHERAL VASCULAR CATHETERIZATION N/A 07/06/2015   Procedure: Abdominal Aortogram w/Lower Extremity;  Surgeon: Algernon Huxley, MD;  Location: [facility];  Service: Cardiovascular;  Laterality: N/A;  . PERIPHERAL VASCULAR CATHETERIZATION  07/06/2015   Procedure: Lower Extremity Intervention;  Surgeon: Algernon Huxley, MD;  Location: [facility];  Service: Cardiovascular;;     SOCIAL HISTORY:   Social History   Tobacco Use  . Smoking status: Current Every Day Smoker    Packs/day:  1.00    Years: 41.00    Pack years: 41.00    Types: Cigarettes  . Smokeless tobacco: Never Used  Substance Use Topics  . Alcohol use: No     FAMILY HISTORY:   Family History  Problem Relation Age of Onset  . Heart attack Father   . Heart disease Father   . Hypertension Father   . Hyperlipidemia Father   . Hypertension Mother   . Hyperlipidemia Mother      DRUG ALLERGIES:   Allergies  Allergen Reactions  . Dulaglutide Anaphylaxis, Diarrhea and Hives  . Prednisone Rash    MEDICATIONS AT HOME:   Prior to Admission medications   Medication Sig Start Date End Date Taking? Authorizing Provider  albuterol (PROVENTIL HFA;VENTOLIN HFA) 108 (90 BASE) MCG/ACT inhaler Inhale 2 puffs into the lungs every 6 (six) hours as needed for wheezing or shortness of breath. 04/15/13  Yes Wellington Hampshire, MD  aspirin EC 81 MG tablet Take 1 tablet (81 mg total) by mouth daily. 05/11/15  Yes Wellington Hampshire, MD  atorvastatin (LIPITOR) 80 MG tablet Take 80 mg by mouth daily.   Yes [provider]  carvedilol (COREG) 6.25 MG tablet Take 1 tablet by mouth 2 (two) times daily. 09/10/17 09/10/18 Yes [provider]  clopidogrel (PLAVIX) 75 MG tablet Take 1 tablet by mouth daily. 08/22/17  Yes [provider]  doxycycline (VIBRAMYCIN) 100 MG capsule Take 1 capsule (100 mg total) by mouth 2 (two) times daily for 14 days. 12/23/17 01/06/18 Yes Gladstone Lighter, MD  furosemide (LASIX) 40 MG tablet Take 1 tablet (40 mg total) 2 (two) times daily by mouth. Patient taking differently: Take 40 mg by mouth daily as needed for fluid.  04/29/17 04/29/18 Yes Max Sane, MD  gabapentin (NEURONTIN) 800 MG tablet Take 1 tablet by mouth 4 (four) times daily. 08/14/17 08/14/18 Yes [provider]  Insulin Glargine (LANTUS SOLOSTAR) 100 UNIT/ML Solostar Pen Inject 70 Units into the skin at bedtime. Patient taking differently: Inject 40 Units into the skin at bedtime.  12/23/17  Yes  Gladstone Lighter, MD  insulin lispro (HUMALOG) 100 UNIT/ML injection Inject 0-10 Units into the skin 3 (three) times daily with meals. If BS is 0 to 70 no insulin and eat/glucose tablets/glucose get If BS 71 to 150 great! If BS 151 to 200, 2 Units Humaog If BS 201 to 250, 4 Units Humalog If BS 251 to 300, 6 Units Humalog If BS 301 to 350, 8 Units Humalog If BS 351 to 400, 10 Units Humalog If BS greater than 400, take 10 Units and Contact our office during office hour   Yes [provider]  ipratropium-albuterol (DUONEB) 0.5-2.5 (3) MG/3ML SOLN Inhale 3 mLs into the lungs every 6 (six) hours as needed (  wheezing/sob).    Yes [provider]  lisinopril (PRINIVIL,ZESTRIL) 5 MG tablet Take 1 tablet by mouth daily. 09/10/17 09/10/18 Yes [provider]  metFORMIN (GLUCOPHAGE-XR) 500 MG 24 hr tablet Take 500 mg by mouth daily with breakfast.   Yes [provider]  nitroGLYCERIN (NITRODUR - DOSED IN MG/24 HR) 0.4 mg/hr patch Place 1 patch onto the skin daily.  08/14/17  Yes [provider]  nitroGLYCERIN (NITROSTAT) 0.4 MG SL tablet Place 0.4 mg under the tongue every 5 (five) minutes as needed for chest pain.   Yes [provider]  oxyCODONE (OXY IR/ROXICODONE) 5 MG immediate release tablet Take 5-10 mg by mouth every 6 (six) hours as needed for severe pain.    Yes [provider]  traZODone (DESYREL) 50 MG tablet Take 1 tablet by mouth at bedtime. 08/12/17  Yes [provider]  nicotine (NICODERM CQ - DOSED IN MG/24 HOURS) 14 mg/24hr patch Place 1 patch (14 mg total) onto the skin daily. Patient not taking: Reported on 01/02/2018 11/23/17   Salary, Avel Peace, MD  predniSONE (STERAPRED UNI-PAK 21 TAB) 10 MG (21) TBPK tablet 6 tabs PO x 1 day 5 tabs PO x 1 day 4 tabs PO x 1 day 3 tabs PO x 1 day 2 tabs PO x 1 day 1 tab PO x 1 day and stop Patient not taking: Reported on 01/02/2018 12/23/17   Gladstone Lighter, MD    REVIEW OF SYSTEMS:   Review of Systems  Unable to perform ROS: Acuity of condition     VITAL SIGNS:   Vitals:   01/02/18 2121 01/02/18 2213 01/02/18 2221 01/02/18 2311  BP:   102/65 (!) 118/105  Pulse:   (!) 127 (!) 117  Resp:   20 17  Temp:      TempSrc:      SpO2:   98% 96%  Weight: 105.7 kg (233 lb) 107.5 kg (237 lb)    Height:  5\' 7"  (1.702 m)     Wt Readings from Last 3 Encounters:  01/02/18 107.5 kg (237 lb)  12/23/17 105.9 kg (233 lb 7.5 oz)  11/23/17 104.5 kg (230 lb 4.8 oz)    PHYSICAL EXAMINATION:  Physical Exam  Vitals reviewed. Constitutional: He appears well-developed and well-nourished. No distress.  HENT:  Head: Normocephalic and atraumatic.  Mouth/Throat: Oropharynx is clear and moist.  Eyes: Pupils are equal, round, and reactive to light. Conjunctivae and EOM are normal. No scleral icterus.  Neck: Normal range of motion. Neck supple. No JVD present. No thyromegaly present.  Cardiovascular: Regular rhythm and intact distal pulses. Exam reveals no gallop and no friction rub.  No murmur heard. Tachycardic  Respiratory: Effort normal and breath sounds normal. No respiratory distress. He has no wheezes. He has no rales.  GI: Soft. Bowel sounds are normal. He exhibits no distension. There is no tenderness.  Musculoskeletal: Normal range of motion. He exhibits no edema.  No arthritis, no gout  Lymphadenopathy:    He has no cervical adenopathy.  Neurological: He is alert. No cranial nerve deficit.  Unable to fully assess due to patient condition  Skin: Skin is warm and dry. No rash noted. There is erythema (Surrounding ulcerated wound on the plantar surface of his right foot).  Psychiatric:  Unable to assess due to patient condition    LABORATORY PANEL:   CBC Recent Labs  Lab 01/02/18 2153  WBC 20.6*  HGB 9.0*  HCT 26.7*  PLT 290   ------------------------------------------------------------------------------------------------------------------  Chemistries  Recent  Labs  Lab 01/02/18 2153  NA 125*  K 5.4*  CL 86*  CO2 30  GLUCOSE 472*  BUN 33*  CREATININE 2.02*  CALCIUM 8.5*  AST 62*  ALT 26  ALKPHOS 60  BILITOT 1.1   ------------------------------------------------------------------------------------------------------------------  Cardiac Enzymes Recent Labs  Lab 01/02/18 2153  TROPONINI 2.95*   ------------------------------------------------------------------------------------------------------------------  RADIOLOGY:  Dg Chest 2 View  Result Date: 01/02/2018 CLINICAL DATA:  Weakness and tremors. Gait abnormalities and memory issues. Sepsis. EXAM: CHEST - 2 VIEW COMPARISON:  12/21/2017 FINDINGS: Mild cardiac enlargement. No vascular congestion, edema, or consolidation. No blunting of costophrenic angles. No pneumothorax. Calcification of the aorta. Mediastinal contours appear intact. Degenerative changes in the spine and shoulders. Old resection or resorption of the distal right clavicle. IMPRESSION: Mild cardiac enlargement. No evidence of active pulmonary disease. Aortic atherosclerosis. Electronically Signed   By: Lucienne Capers M.D.   On: 01/02/2018 21:51   Dg Foot Complete Right  Result Date: 01/02/2018 CLINICAL DATA:  Diabetic ulcer at the base of the fifth metatarsal bone. EXAM: RIGHT FOOT COMPLETE - 3+ VIEW COMPARISON:  Right foot radiograph 12/22/2017. MRI right foot 12/23/2017 FINDINGS: Large soft tissue ulceration over the plantar surface of the lateral right foot at the level of the fifth metatarsal base. This was present on the previous study. In the interval, there is development of soft tissue gas along the dorsum of the right foot. This likely reflects progression of infection with gas-forming organism. No definite radiographic changes to suggest osteomyelitis, but changes of osteomyelitis were demonstrated in the fourth and fifth metatarsal bones on the previous MRI. Degenerative changes in the interphalangeal and  intertarsal joints. Plantar calcaneal spur. No radiopaque soft tissue foreign bodies. IMPRESSION: Large soft tissue ulceration over the plantar surface of the right foot at the level of the fifth metatarsal base as seen previously. New soft tissue gas along the dorsum of the right foot likely representing progression of infection with gas-forming organism. No radiographic evidence of osteomyelitis, but previous MRI showed evidence of osteomyelitis in the fourth and fifth metatarsal bones. Electronically Signed   By: Lucienne Capers M.D.   On: 01/02/2018 22:46    EKG:   Orders placed or performed during the hospital encounter of 01/02/18  . EKG 12-Lead  . EKG 12-Lead  . ED EKG 12-Lead  . ED EKG 12-Lead    IMPRESSION AND PLAN:  Principal Problem:   Sepsis ([place]) -IV antibiotics started, lactic acid mildly elevated, we will administer gentle IV fluids and continue to monitor until lactic acid is within normal limits, source of his infection is diabetic foot wound, cultures sent Active Problems:   Coronary artery disease -tinea home meds, troponin is significantly elevated, see below for treatment   Diabetic foot infection ([place]) -podiatry consult, IV antibiotics as above   NSTEMI (non-ST elevated myocardial infarction) ([place]) -possibly related to demand ischemia given sepsis, also possibly related to primary ACS.  We will trend his cardiac enzymes, get an echocardiogram, we have started him on a heparin drip, will get a cardiology consult   AKI (acute kidney injury) ([place]) -IV fluids as above, avoid nephrotoxins and monitor   Hypertension -hold antihypertensives for now as the patient's blood pressure is borderline low   Chronic systolic CHF (congestive heart failure) (H[facility]) -to new home meds, cautious IV fluid administration as above   Diabetes ([facility]) -sliding scale insulin with corresponding glucose checks   Hyperlipidemia -Home dose antilipid  Chart review performed and  case discussed with ED  provider. Labs, imaging and/or ECG reviewed by provider and discussed with patient/family. Management plans discussed with the patient and/or family.  DVT PROPHYLAXIS: SubQ lovenox  GI PROPHYLAXIS: None  ADMISSION STATUS: Inpatient  CODE STATUS: Full Code Status History    Date Active Date Inactive Code Status Order ID Comments User Context   12/22/2017 0607 12/23/2017 1916 Full Code [id number]  Harrie Foreman, MD Inpatient   11/21/2017 0415 11/23/2017 1444 Full Code [id number]  Lance Coon, MD Inpatient   04/23/2017 0355 04/29/2017 1353 Full Code [id number]  Saundra Shelling, MD Inpatient   07/11/2016 0324 07/11/2016 1851 Full Code [id number]  Saundra Shelling, MD Inpatient   05/06/2016 0005 05/07/2016 1417 Full Code [id number]  Harvie Bridge, DO Inpatient   11/21/2015 1716 11/22/2015 1950 Full Code [id number]  Gladstone Lighter, MD Inpatient   07/06/2015 0948 07/06/2015 1437 Full Code [id number]  Algernon Huxley, MD Inpatient      TOTAL TIME TAKING CARE OF THIS PATIENT: 45 minutes.   [name] [name] 01/02/2018, 11:44 PM  [employer]  [phone]  CC: Primary care physician; Clarisse Gouge, MD  Note:  This document was prepared using Dragon voice recognition software and may include unintentional dictation errors.

## 2018-01-02 NOTE — ED Notes (Signed)
Pt was recently admitted for sepsis and is here today for general weakness, tremors, and fever. Pt has diabetic foot ulcer noted to bottom of right foot. Purulent drainage noted from wound. Pt has pain in right foot.

## 2018-01-02 NOTE — ED Triage Notes (Signed)
Patient c/o weakness, tremors. Patient's daughter reports gait abnormalities and memory issues. Patient's last known normal yesterday evening. Patient recently seen, treated, and hospitalized in this ED for sepsis.

## 2018-01-02 NOTE — ED Provider Notes (Signed)
[facility] Emergency Department Provider Note  ____________________________________________   First MD Initiated Contact with Patient 01/02/18 2128     (approximate)  I have reviewed the triage vital signs and the nursing notes.   HISTORY  Chief Complaint Weakness  Level 5 exemption history limited by the patient's altered mental status  HPI [name] [name] is a 57 y.o. male who comes to the emergency department with generalized weakness and malaise that began yesterday.   Is associated with a fever that began earlier today.  He and family deny trauma.  He denies chest pain or shortness of breath.  He denies abdominal pain nausea or vomiting.  He does have a complicated right foot wound which has been treated with unknown antibiotics as an outpatient.  His symptoms began gradually are now constant and severe.  Nothing seems to make it better or worse.   Past Medical History:  Diagnosis Date  . Arthritis   . Asthma   . Atherosclerosis   . BPH (benign prostatic hyperplasia)   . Carotid arterial disease ([place])   . Charcot's joint of foot, right   . Chronic combined systolic (congestive) and diastolic (congestive) heart failure ([place])    a. 02/2013 EF 50% by LV gram; b. 04/2017 Echo: EF 25-30%. diff HK. Gr2 DD. Mod dil LA/RV. PASP 70mmHg.  [name] COPD (chronic obstructive pulmonary disease) ([place])   . Coronary artery disease    a. 2013 S/P PCI of LAD [facility]);  b. 03/2013 PCI: RCA 90p ( 2.5 x 23 mm DES); c. 02/2014 Cath: patent RCA stent->Med Rx; d. 04/2017 Cath: LM nl, LAd 30ost/p, 48m/d, D1 80ost, LCX 90p/m, OM1 85, RCA 70/20p, 30m, 70d->Referred for CT Surg-felt to be poor candidate.  . Diabetic neuropathy ([place])   . Diabetic ulcer of right foot ([place])   . Hernia   . Hyperlipidemia   . Hypertension   . Ischemic cardiomyopathy    a. 04/2017 Echo: EF 25-30%; b. 08/2017 Cardiac MRI ([facility]): EF 19%, sev glob HK. RVEF 20%, mod BAE, triv MR, mild-mod TR. Basal lateral  subendocardial infarct (viable), inf/infsept, dist septal ischemia, basal to mid lat peri-infarct ischemia.  . Morbid obesity ([place])   . Neuropathy   . PAD (peripheral artery disease) ([place])    a. Followed by Dr. Lucky Cowboy; b. 08/2010 Periph Angio: RSFA 70-80p (6X60 self-expanding stent); c. 09/2010 Periph Angio: L SFA 100p (7x unknown length self-expanding stent); d. 06/2015 Periph Angio: R SFA short segment occlusion (6x12 self-expanding stent).  . Restless leg syndrome   . Sleep apnea   . Subclavian artery stenosis, right ([place])   . Syncope and collapse   . Tobacco use    a. 75+ yr hx - still smoking 1ppd, down from 2 ppd.  . Type II diabetes mellitus ([place])   . Varicose vein     Patient Active Problem List   Diagnosis Date Noted  . AKI (acute kidney injury) ([place]) 01/03/2018  . Elevated troponin   . Sepsis ([place]) 12/22/2017  . NSTEMI (non-ST elevated myocardial infarction) ([place]) 11/21/2017  . Diabetes ([place]) 05/02/2017  . Lymphedema 05/02/2017  . Chronic systolic CHF (congestive heart failure) ([place]) 04/23/2017  . Chest pain 07/11/2016  . Unstable angina ([place]) 07/11/2016  . Ulcer of right foot ([place])   . Stable angina (H[facility])   . Pressure injury of skin 05/06/2016  . Diabetic foot infection ([place]) 05/05/2016  . Cellulitis 11/21/2015  . Morbid obesity ([place]) 11/17/2014  . PAD (peripheral artery disease) (  [place])   . Coronary artery disease   . Hyperlipidemia   . Hypertension   . Tobacco use     Past Surgical History:  Procedure Laterality Date  . ABDOMINAL AORTAGRAM N/A 06/23/2013   Procedure: ABDOMINAL Maxcine Ham;  Surgeon: Wellington Hampshire, MD;  Location: [place] CATH LAB;  Service: Cardiovascular;  Laterality: N/A;  . APPENDECTOMY    . CARDIAC CATHETERIZATION  10/14   [facility]; X1 STENT PROXIMAL RCA  . CARDIAC CATHETERIZATION  02/2010   [facility]  . CARDIAC CATHETERIZATION  02/21/2014   [facility]  . CLAVICLE SURGERY    . HERNIA REPAIR    . IRRIGATION AND DEBRIDEMENT FOOT Right 01/04/2018   Procedure:  IRRIGATION AND DEBRIDEMENT FOOT;  Surgeon: Samara Deist, DPM;  Location: [facility] ORS;  Service: Podiatry;  Laterality: Right;  . LEFT HEART CATH AND CORS/GRAFTS ANGIOGRAPHY N/A 04/28/2017   Procedure: LEFT HEART CATH AND CORONARY ANGIOGRAPHY;  Surgeon: Wellington Hampshire, MD;  Location: [place] CV LAB;  Service: Cardiovascular;  Laterality: N/A;  . PERIPHERAL ARTERIAL STENT GRAFT     x2 left/right  . PERIPHERAL VASCULAR CATHETERIZATION N/A 07/06/2015   Procedure: Abdominal Aortogram w/Lower Extremity;  Surgeon: Algernon Huxley, MD;  Location: [place] CV LAB;  Service: Cardiovascular;  Laterality: N/A;  . PERIPHERAL VASCULAR CATHETERIZATION  07/06/2015   Procedure: Lower Extremity Intervention;  Surgeon: Algernon Huxley, MD;  Location: [place] CV LAB;  Service: Cardiovascular;;    Prior to Admission medications   Medication Sig Start Date End Date Taking? Authorizing Provider  albuterol (PROVENTIL HFA;VENTOLIN HFA) 108 (90 BASE) MCG/ACT inhaler Inhale 2 puffs into the lungs every 6 (six) hours as needed for wheezing or shortness of breath. 04/15/13  Yes Wellington Hampshire, MD  aspirin EC 81 MG tablet Take 1 tablet (81 mg total) by mouth daily. 05/11/15  Yes Wellington Hampshire, MD  atorvastatin (LIPITOR) 80 MG tablet Take 80 mg by mouth daily.   Yes [provider]  carvedilol (COREG) 6.25 MG tablet Take 1 tablet by mouth 2 (two) times daily. 09/10/17 09/10/18 Yes [provider]  clopidogrel (PLAVIX) 75 MG tablet Take 1 tablet by mouth daily. 08/22/17  Yes [provider]  doxycycline (VIBRAMYCIN) 100 MG capsule Take 1 capsule (100 mg total) by mouth 2 (two) times daily for 14 days. 12/23/17 01/06/18 Yes Gladstone Lighter, MD  furosemide (LASIX) 40 MG tablet Take 1 tablet (40 mg total) 2 (two) times daily by mouth. Patient taking differently: Take 40 mg by mouth daily as needed for fluid.  04/29/17 04/29/18 Yes Max Sane, MD  gabapentin (NEURONTIN) 800 MG tablet Take 1  tablet by mouth 4 (four) times daily. 08/14/17 08/14/18 Yes [provider]  Insulin Glargine (LANTUS SOLOSTAR) 100 UNIT/ML Solostar Pen Inject 70 Units into the skin at bedtime. Patient taking differently: Inject 40 Units into the skin at bedtime.  12/23/17  Yes Gladstone Lighter, MD  insulin lispro (HUMALOG) 100 UNIT/ML injection Inject 0-10 Units into the skin 3 (three) times daily with meals. If BS is 0 to 70 no insulin and eat/glucose tablets/glucose get If BS 71 to 150 great! If BS 151 to 200, 2 Units Humaog If BS 201 to 250, 4 Units Humalog If BS 251 to 300, 6 Units Humalog If BS 301 to 350, 8 Units Humalog If BS 351 to 400, 10 Units Humalog If BS greater than 400, take 10 Units and Contact our office during office hour   Yes [provider]  ipratropium-albuterol (DUONEB) 0.5-2.5 (3) MG/3ML SOLN Inhale 3 mLs into the lungs every 6 (six) hours as needed (wheezing/sob).    Yes [provider]  lisinopril (PRINIVIL,ZESTRIL) 5 MG tablet Take 1 tablet by mouth daily. 09/10/17 09/10/18 Yes [provider]  metFORMIN (GLUCOPHAGE-XR) 500 MG 24 hr tablet Take 500 mg by mouth daily with breakfast.   Yes [provider]  nitroGLYCERIN (NITRODUR - DOSED IN MG/24 HR) 0.4 mg/hr patch Place 1 patch onto the skin daily.  08/14/17  Yes [provider]  nitroGLYCERIN (NITROSTAT) 0.4 MG SL tablet Place 0.4 mg under the tongue every 5 (five) minutes as needed for chest pain.   Yes [provider]  oxyCODONE (OXY IR/ROXICODONE) 5 MG immediate release tablet Take 5-10 mg by mouth every 6 (six) hours as needed for severe pain.    Yes [provider]  traZODone (DESYREL) 50 MG tablet Take 1 tablet by mouth at bedtime. 08/12/17  Yes [provider]  nicotine (NICODERM CQ - DOSED IN MG/24 HOURS) 14 mg/24hr patch Place 1 patch (14 mg total) onto the skin daily. Patient not taking: Reported on 01/02/2018 11/23/17   Salary, Avel Peace, MD  predniSONE  (STERAPRED UNI-PAK 21 TAB) 10 MG (21) TBPK tablet 6 tabs PO x 1 day 5 tabs PO x 1 day 4 tabs PO x 1 day 3 tabs PO x 1 day 2 tabs PO x 1 day 1 tab PO x 1 day and stop Patient not taking: Reported on 01/02/2018 12/23/17   Gladstone Lighter, MD    Allergies Dulaglutide and Prednisone  Family History  Problem Relation Age of Onset  . Heart attack Father   . Heart disease Father   . Hypertension Father   . Hyperlipidemia Father   . Hypertension Mother   . Hyperlipidemia Mother     Social History Social History   Tobacco Use  . Smoking status: Current Every Day Smoker    Packs/day: 1.00    Years: 41.00    Pack years: 41.00    Types: Cigarettes  . Smokeless tobacco: Never Used  Substance Use Topics  . Alcohol use: No  . Drug use: No    Review of Systems Level 5 exemption history limited by the patient's clinical condition ____________________________________________   PHYSICAL EXAM:  VITAL SIGNS: ED Triage Vitals  Enc Vitals Group     BP 01/02/18 2117 (!) 91/58     Pulse Rate 01/02/18 2117 (!) 132     Resp 01/02/18 2117 (!) 23     Temp 01/02/18 2117 (!) 102.6 F (39.2 C)     Temp Source 01/02/18 2117 Oral     SpO2 01/02/18 2117 92 %     Weight 01/02/18 2121 233 lb (105.7 kg)     Height --      Head Circumference --      Peak Flow --      Pain Score --      Pain Loc --      Pain Edu? --      Excl. in [place]? --     Constitutional: The patient is pleasantly confused.  He is diaphoretic.  Overwhelming gangrenous smell in the room Eyes: PERRL EOMI. midrange and brisk Head: Atraumatic. Nose: No congestion/rhinnorhea. Mouth/Throat: No trismus Neck: No stridor.   Cardiovascular: Tachycardic rate, regular rhythm. Grossly normal heart sounds.  Good peripheral circulation. Respiratory: Slightly increased respiratory effort.  No retractions. Lungs CTAB and moving good air Gastrointestinal: Soft nontender Musculoskeletal: Large  wound draining purulent material to the  plantar aspect of right foot.  He has erythema and some swelling about midway up his right leg.  No bulla blister sloughing or other signs of necrotizing soft tissue infection Neurologic: . No gross focal neurologic deficits are appreciated. Skin: Cellulitic skin as above Psychiatric: Pleasantly encephalopathic   ____________________________________________   DIFFERENTIAL includes but not limited to  Sepsis, dehydration, encephalitis, acute coronary syndrome ____________________________________________   LABS (all labs ordered are listed, but only abnormal results are displayed)  Labs Reviewed  CULTURE, BLOOD (ROUTINE X 2) - Abnormal; Notable for the following components:      Result Value   Culture GROUP B STREP(S.AGALACTIAE)ISOLATED (*)    All other components within normal limits  BLOOD CULTURE ID PANEL (REFLEXED) - Abnormal; Notable for the following components:   Streptococcus species DETECTED (*)    Streptococcus agalactiae DETECTED (*)    All other components within normal limits  URINALYSIS, COMPLETE (UACMP) WITH MICROSCOPIC - Abnormal; Notable for the following components:   Color, Urine YELLOW (*)    APPearance CLEAR (*)    Glucose, UA >=500 (*)    All other components within normal limits  LACTIC ACID, PLASMA - Abnormal; Notable for the following components:   Lactic Acid, Venous 2.3 (*)    All other components within normal limits  COMPREHENSIVE METABOLIC PANEL - Abnormal; Notable for the following components:   Sodium 125 (*)    Potassium 5.4 (*)    Chloride 86 (*)    Glucose, Bld 472 (*)    BUN 33 (*)    Creatinine, Ser 2.02 (*)    Calcium 8.5 (*)    Albumin 2.6 (*)    AST 62 (*)    GFR calc non Af Amer 35 (*)    GFR calc Af Amer 41 (*)    All other components within normal limits  TROPONIN I - Abnormal; Notable for the following components:   Troponin I 2.95 (*)    All other components within normal limits  CBC WITH DIFFERENTIAL/PLATELET - Abnormal;  Notable for the following components:   WBC 20.6 (*)    RBC 3.19 (*)    Hemoglobin 9.0 (*)    HCT 26.7 (*)    RDW 18.4 (*)    Neutro Abs 18.2 (*)    Lymphs Abs 0.8 (*)    Monocytes Absolute 1.5 (*)    All other components within normal limits  APTT - Abnormal; Notable for the following components:   aPTT 41 (*)    All other components within normal limits  TROPONIN I - Abnormal; Notable for the following components:   Troponin I 3.52 (*)    All other components within normal limits  BASIC METABOLIC PANEL - Abnormal; Notable for the following components:   Sodium 129 (*)    Chloride 92 (*)    Glucose, Bld 360 (*)    BUN 34 (*)    Creatinine, Ser 1.84 (*)    Calcium 8.2 (*)    GFR calc non Af Amer 40 (*)    GFR calc Af Amer 46 (*)    All other components within normal limits  CBC - Abnormal; Notable for the following components:   WBC 17.7 (*)    RBC 3.00 (*)    Hemoglobin 8.7 (*)    HCT 25.1 (*)    RDW 18.0 (*)    All other components within normal limits  TROPONIN I - Abnormal; Notable for the following  components:   Troponin I 3.09 (*)    All other components within normal limits  TROPONIN I - Abnormal; Notable for the following components:   Troponin I 1.95 (*)    All other components within normal limits  TROPONIN I - Abnormal; Notable for the following components:   Troponin I 2.49 (*)    All other components within normal limits  HEPARIN LEVEL (UNFRACTIONATED) - Abnormal; Notable for the following components:   Heparin Unfractionated <0.10 (*)    All other components within normal limits  HEPARIN LEVEL (UNFRACTIONATED) - Abnormal; Notable for the following components:   Heparin Unfractionated <0.10 (*)    All other components within normal limits  CBC - Abnormal; Notable for the following components:   WBC 17.6 (*)    RBC 3.13 (*)    Hemoglobin 8.8 (*)    HCT 26.0 (*)    RDW 18.0 (*)    All other components within normal limits  BASIC METABOLIC PANEL -  Abnormal; Notable for the following components:   Sodium 127 (*)    Potassium 5.2 (*)    Chloride 93 (*)    Glucose, Bld 364 (*)    BUN 39 (*)    Creatinine, Ser 1.48 (*)    Calcium 8.5 (*)    GFR calc non Af Amer 52 (*)    GFR calc Af Amer 60 (*)    All other components within normal limits  HEPARIN LEVEL (UNFRACTIONATED) - Abnormal; Notable for the following components:   Heparin Unfractionated <0.10 (*)    All other components within normal limits  GLUCOSE, CAPILLARY - Abnormal; Notable for the following components:   Glucose-Capillary 296 (*)    All other components within normal limits  GLUCOSE, CAPILLARY - Abnormal; Notable for the following components:   Glucose-Capillary 361 (*)    All other components within normal limits  GLUCOSE, CAPILLARY - Abnormal; Notable for the following components:   Glucose-Capillary 352 (*)    All other components within normal limits  TROPONIN I - Abnormal; Notable for the following components:   Troponin I 2.17 (*)    All other components within normal limits  HEPARIN LEVEL (UNFRACTIONATED) - Abnormal; Notable for the following components:   Heparin Unfractionated 0.16 (*)    All other components within normal limits  GLUCOSE, CAPILLARY - Abnormal; Notable for the following components:   Glucose-Capillary 409 (*)    All other components within normal limits  HEPARIN LEVEL (UNFRACTIONATED) - Abnormal; Notable for the following components:   Heparin Unfractionated <0.10 (*)    All other components within normal limits  GLUCOSE, CAPILLARY - Abnormal; Notable for the following components:   Glucose-Capillary 413 (*)    All other components within normal limits  CBC - Abnormal; Notable for the following components:   WBC 11.6 (*)    RBC 3.53 (*)    Hemoglobin 10.0 (*)    HCT 29.4 (*)    RDW 18.4 (*)    All other components within normal limits  BASIC METABOLIC PANEL - Abnormal; Notable for the following components:   Sodium 133 (*)     Glucose, Bld 363 (*)    BUN 54 (*)    Creatinine, Ser 1.46 (*)    Calcium 8.5 (*)    GFR calc non Af Amer 52 (*)    All other components within normal limits  GLUCOSE, CAPILLARY - Abnormal; Notable for the following components:   Glucose-Capillary 503 (*)    All other  components within normal limits  GLUCOSE, CAPILLARY - Abnormal; Notable for the following components:   Glucose-Capillary 479 (*)    All other components within normal limits  GLUCOSE, CAPILLARY - Abnormal; Notable for the following components:   Glucose-Capillary 371 (*)    All other components within normal limits  GLUCOSE, CAPILLARY - Abnormal; Notable for the following components:   Glucose-Capillary 319 (*)    All other components within normal limits  GLUCOSE, CAPILLARY - Abnormal; Notable for the following components:   Glucose-Capillary 417 (*)    All other components within normal limits  CULTURE, BLOOD (ROUTINE X 2)  AEROBIC/ANAEROBIC CULTURE (SURGICAL/DEEP WOUND)  AEROBIC/ANAEROBIC CULTURE (SURGICAL/DEEP WOUND)  URINE CULTURE  LACTIC ACID, PLASMA  LIPASE, BLOOD  PROCALCITONIN  PROTIME-INR  LACTIC ACID, PLASMA  VANCOMYCIN, TROUGH  SURGICAL PATHOLOGY    Lab work reviewed by me with hyponatremia and elevated blood glucose most likely secondary to dehydration and sepsis __________________________________________  EKG  ED ECG REPORT I, Darel Hong, the attending physician, personally viewed and interpreted this ECG.  Date: 01/05/2018 EKG Time:  Rate: 134 Rhythm: normal sinus rhythm QRS Axis: Leftward axis Intervals: normal ST/T Wave abnormalities: normal Narrative Interpretation: no evidence of acute ischemia  ____________________________________________  RADIOLOGY  X-ray of the right foot reviewed by me concerning for gas Chest x-ray reviewed by me with no acute disease ____________________________________________   PROCEDURES  Procedure(s) performed: no  .Critical  Care Performed by: Darel Hong, MD Authorized by: Darel Hong, MD   Critical care provider statement:    Critical care time (minutes):  40   Critical care time was exclusive of:  Separately billable procedures and treating other patients   Critical care was necessary to treat or prevent imminent or life-threatening deterioration of the following conditions:  Sepsis   Critical care was time spent personally by me on the following activities:  Development of treatment plan with patient or surrogate, discussions with consultants, evaluation of patient's response to treatment, examination of patient, obtaining history from patient or surrogate, ordering and performing treatments and interventions, ordering and review of laboratory studies, ordering and review of radiographic studies, pulse oximetry, re-evaluation of patient's condition and review of old charts    Critical Care performed: Yes  ____________________________________________   INITIAL IMPRESSION / ASSESSMENT AND PLAN / ED COURSE  Pertinent labs & imaging results that were available during my care of the patient were reviewed by me and considered in my medical decision making (see chart for details).   As part of my medical decision making, I reviewed the following data within the [place] History obtained from family if available, nursing notes, old chart and ekg, as well as notes from prior ED visits.  The patient arrives tachycardic, confused, and febrile to 102.8 degrees.  The likely source is the infection in his right foot.  He is clearly septic at this point so we will begin resuscitation with 2 L of IV crystalloid.  Blood cultures and lactic acid are pending.  I doubt necrotizing soft tissue infection at this time.  Cefepime and vancomycin are pending along with Flagyl.  The patient requires inpatient admission for continued management of his severe sepsis.  Will likely require surgical  intervention.     ____________________________________________   FINAL CLINICAL IMPRESSION(S) / ED DIAGNOSES  Final diagnoses:  Sepsis, due to unspecified organism ([place])  Elevated troponin  Acute kidney injury ([place])      NEW MEDICATIONS STARTED DURING THIS VISIT:  Current Discharge  Medication List       Note:  This document was prepared using Dragon voice recognition software and may include unintentional dictation errors.     Darel Hong, MD 01/05/18 1121

## 2018-01-02 NOTE — ED Notes (Addendum)
Date and time results received: 01/02/18 10:48 PM   Test: Lactic Acid Critical Value: 2.3 mmol/L  Name of Provider Notified: Dr. Mable Paris

## 2018-01-02 NOTE — Progress Notes (Addendum)
CODE SEPSIS - PHARMACY COMMUNICATION  **Broad Spectrum Antibiotics should be administered within 1 hour of Sepsis diagnosis**  Time Code Sepsis Called/Page Received: @2129   Antibiotics Ordered: Cefepime                                       vancomycin  Time of 1st antibiotic administration: @2231   Additional action taken by pharmacy: Called nurse @ 2222 to check on about Abx being administered.   If necessary, Name of Provider/Nurse Contacted: Devan  Pernell Dupre, PharmD, BCPS Clinical Pharmacist 01/02/2018 10:38 PM

## 2018-01-03 ENCOUNTER — Inpatient Hospital Stay: Admit: 2018-01-03 | Payer: Medicaid Other

## 2018-01-03 ENCOUNTER — Inpatient Hospital Stay: Payer: Medicaid Other

## 2018-01-03 DIAGNOSIS — N179 Acute kidney failure, unspecified: Secondary | ICD-10-CM | POA: Diagnosis present

## 2018-01-03 DIAGNOSIS — R748 Abnormal levels of other serum enzymes: Secondary | ICD-10-CM

## 2018-01-03 HISTORY — DX: Acute kidney failure, unspecified: N17.9

## 2018-01-03 LAB — GLUCOSE, CAPILLARY
Glucose-Capillary: 296 mg/dL — ABNORMAL HIGH (ref 70–99)
Glucose-Capillary: 361 mg/dL — ABNORMAL HIGH (ref 70–99)

## 2018-01-03 LAB — BLOOD CULTURE ID PANEL (REFLEXED)

## 2018-01-03 LAB — BASIC METABOLIC PANEL
Anion gap: 8 (ref 5–15)
BUN: 34 mg/dL — ABNORMAL HIGH (ref 6–20)
CO2: 29 mmol/L (ref 22–32)
Calcium: 8.2 mg/dL — ABNORMAL LOW (ref 8.9–10.3)
Chloride: 92 mmol/L — ABNORMAL LOW (ref 98–111)
Creatinine, Ser: 1.84 mg/dL — ABNORMAL HIGH (ref 0.61–1.24)
GFR calc Af Amer: 46 mL/min — ABNORMAL LOW (ref >60)
GFR calc non Af Amer: 40 mL/min — ABNORMAL LOW (ref >60)
Glucose, Bld: 360 mg/dL — ABNORMAL HIGH (ref 70–99)
Potassium: 4.4 mmol/L (ref 3.5–5.1)
Sodium: 129 mmol/L — ABNORMAL LOW (ref 135–145)

## 2018-01-03 LAB — CBC
HCT: 25.1 % — ABNORMAL LOW (ref 40.0–52.0)
Hemoglobin: 8.7 g/dL — ABNORMAL LOW (ref 13.0–18.0)
MCH: 28.9 pg (ref 26.0–34.0)
MCHC: 34.5 g/dL (ref 32.0–36.0)
MCV: 83.8 fL (ref 80.0–100.0)
Platelets: 278 10*3/uL (ref 150–440)
RBC: 3 MIL/uL — ABNORMAL LOW (ref 4.40–5.90)
RDW: 18 % — ABNORMAL HIGH (ref 11.5–14.5)
WBC: 17.7 10*3/uL — ABNORMAL HIGH (ref 3.8–10.6)

## 2018-01-03 LAB — TROPONIN I
Troponin I: 3.52 ng/mL (ref <0.03)
Troponin I: 3.09 ng/mL (ref <0.03)
Troponin I: 1.95 ng/mL (ref <0.03)
Troponin I: 2.49 ng/mL (ref <0.03)

## 2018-01-03 LAB — APTT: aPTT: 41 seconds — ABNORMAL HIGH (ref 24–36)

## 2018-01-03 LAB — HEPARIN LEVEL (UNFRACTIONATED)
Heparin Unfractionated: <0.1 IU/mL — ABNORMAL LOW (ref 0.30–0.70)
Heparin Unfractionated: <0.1 IU/mL — ABNORMAL LOW (ref 0.30–0.70)

## 2018-01-03 LAB — LACTIC ACID, PLASMA: Lactic Acid, Venous: 1.3 mmol/L (ref 0.5–1.9)

## 2018-01-03 MED ORDER — HEPARIN BOLUS VIA INFUSION
2700.0000 [IU] | Freq: Once | INTRAVENOUS | Status: AC
  Administered 2018-01-03: 2700 [IU] via INTRAVENOUS
  Filled 2018-01-03: qty 2700

## 2018-01-03 MED ORDER — POVIDONE-IODINE 7.5 % EX SOLN
Freq: Once | CUTANEOUS | Status: DC
  Filled 2018-01-03: qty 118

## 2018-01-03 MED ORDER — TRAZODONE HCL 50 MG PO TABS
50.0000 mg | ORAL_TABLET | Freq: Every day | ORAL | Status: DC
  Administered 2018-01-03 – 2018-01-08 (×6): 50 mg via ORAL
  Filled 2018-01-03 (×6): qty 1

## 2018-01-03 MED ORDER — METHYLPREDNISOLONE SODIUM SUCC 125 MG IJ SOLR
60.0000 mg | Freq: Four times a day (QID) | INTRAMUSCULAR | Status: DC
  Administered 2018-01-03 – 2018-01-05 (×8): 60 mg via INTRAVENOUS
  Filled 2018-01-03 (×7): qty 2

## 2018-01-03 MED ORDER — ENOXAPARIN SODIUM 40 MG/0.4ML ~~LOC~~ SOLN: 40.0000 mg | SUBCUTANEOUS | Status: DC

## 2018-01-03 MED ORDER — ONDANSETRON HCL 4 MG/2ML IJ SOLN: 4.0000 mg | Freq: Four times a day (QID) | INTRAMUSCULAR | Status: DC | PRN

## 2018-01-03 MED ORDER — INSULIN ASPART 100 UNIT/ML ~~LOC~~ SOLN
0.0000 [IU] | Freq: Three times a day (TID) | SUBCUTANEOUS | Status: DC
  Administered 2018-01-03: 5 [IU] via SUBCUTANEOUS
  Filled 2018-01-03 (×2): qty 1

## 2018-01-03 MED ORDER — SODIUM CHLORIDE 0.9 % IV SOLN
INTRAVENOUS | Status: DC
  Administered 2018-01-03: 05:00:00 via INTRAVENOUS

## 2018-01-03 MED ORDER — IPRATROPIUM-ALBUTEROL 0.5-2.5 (3) MG/3ML IN SOLN
3.0000 mL | Freq: Four times a day (QID) | RESPIRATORY_TRACT | Status: DC
  Administered 2018-01-03 – 2018-01-07 (×12): 3 mL via RESPIRATORY_TRACT
  Filled 2018-01-03 (×12): qty 3

## 2018-01-03 MED ORDER — ACETAMINOPHEN 650 MG RE SUPP: 650.0000 mg | Freq: Four times a day (QID) | RECTAL | Status: DC | PRN

## 2018-01-03 MED ORDER — HEPARIN (PORCINE) IN NACL 100-0.45 UNIT/ML-% IJ SOLN
2550.0000 [IU]/h | INTRAMUSCULAR | Status: DC
  Administered 2018-01-03: 1200 [IU]/h via INTRAVENOUS
  Administered 2018-01-04: 2250 [IU]/h via INTRAVENOUS
  Filled 2018-01-03 (×3): qty 250

## 2018-01-03 MED ORDER — INSULIN GLARGINE 100 UNIT/ML ~~LOC~~ SOLN
40.0000 [IU] | Freq: Every day | SUBCUTANEOUS | Status: DC
  Administered 2018-01-03: 40 [IU] via SUBCUTANEOUS
  Filled 2018-01-03 (×2): qty 0.4

## 2018-01-03 MED ORDER — CARVEDILOL 6.25 MG PO TABS
6.2500 mg | ORAL_TABLET | Freq: Two times a day (BID) | ORAL | Status: DC
  Administered 2018-01-03 – 2018-01-07 (×9): 6.25 mg via ORAL
  Filled 2018-01-03 (×9): qty 1

## 2018-01-03 MED ORDER — HEPARIN BOLUS VIA INFUSION
4000.0000 [IU] | Freq: Once | INTRAVENOUS | Status: AC
  Administered 2018-01-03: 4000 [IU] via INTRAVENOUS
  Filled 2018-01-03: qty 4000

## 2018-01-03 MED ORDER — SODIUM CHLORIDE 0.9 % IV SOLN
2.0000 g | Freq: Two times a day (BID) | INTRAVENOUS | Status: DC
  Administered 2018-01-03 (×2): 2 g via INTRAVENOUS
  Filled 2018-01-03 (×4): qty 2

## 2018-01-03 MED ORDER — DIPHENHYDRAMINE HCL 25 MG PO CAPS
25.0000 mg | ORAL_CAPSULE | Freq: Every evening | ORAL | Status: DC | PRN
  Administered 2018-01-03: 25 mg via ORAL
  Filled 2018-01-03: qty 1

## 2018-01-03 MED ORDER — OXYCODONE HCL 5 MG PO TABS
10.0000 mg | ORAL_TABLET | ORAL | Status: DC | PRN
  Administered 2018-01-03 – 2018-01-08 (×21): 10 mg via ORAL
  Filled 2018-01-03 (×20): qty 2

## 2018-01-03 MED ORDER — INSULIN GLARGINE 100 UNIT/ML SOLOSTAR PEN: 40.0000 [IU] | PEN_INJECTOR | Freq: Every day | SUBCUTANEOUS | Status: DC

## 2018-01-03 MED ORDER — INSULIN ASPART 100 UNIT/ML ~~LOC~~ SOLN: 0.0000 [IU] | Freq: Four times a day (QID) | SUBCUTANEOUS | Status: DC

## 2018-01-03 MED ORDER — ASPIRIN EC 81 MG PO TBEC
81.0000 mg | DELAYED_RELEASE_TABLET | Freq: Every day | ORAL | Status: DC
  Administered 2018-01-03 – 2018-01-09 (×7): 81 mg via ORAL
  Filled 2018-01-03 (×7): qty 1

## 2018-01-03 MED ORDER — ATORVASTATIN CALCIUM 20 MG PO TABS
80.0000 mg | ORAL_TABLET | Freq: Every day | ORAL | Status: DC
  Administered 2018-01-03 – 2018-01-09 (×7): 80 mg via ORAL
  Filled 2018-01-03 (×4): qty 4
  Filled 2018-01-03: qty 8
  Filled 2018-01-03 (×2): qty 4

## 2018-01-03 MED ORDER — ONDANSETRON HCL 4 MG PO TABS: 4.0000 mg | ORAL_TABLET | Freq: Four times a day (QID) | ORAL | Status: DC | PRN

## 2018-01-03 MED ORDER — ACETAMINOPHEN 325 MG PO TABS
650.0000 mg | ORAL_TABLET | Freq: Four times a day (QID) | ORAL | Status: DC | PRN
  Administered 2018-01-08 – 2018-01-09 (×2): 650 mg via ORAL
  Filled 2018-01-03 (×2): qty 2

## 2018-01-03 MED ORDER — OXYCODONE HCL 5 MG PO TABS
5.0000 mg | ORAL_TABLET | ORAL | Status: DC | PRN
  Administered 2018-01-03 (×3): 5 mg via ORAL
  Filled 2018-01-03 (×3): qty 1

## 2018-01-03 MED ORDER — CLOPIDOGREL BISULFATE 75 MG PO TABS
75.0000 mg | ORAL_TABLET | Freq: Every day | ORAL | Status: DC
  Administered 2018-01-03 – 2018-01-09 (×7): 75 mg via ORAL
  Filled 2018-01-03 (×7): qty 1

## 2018-01-03 MED ORDER — VANCOMYCIN HCL IN DEXTROSE 1-5 GM/200ML-% IV SOLN
1000.0000 mg | INTRAVENOUS | Status: DC
  Administered 2018-01-03 (×2): 1000 mg via INTRAVENOUS
  Filled 2018-01-03 (×3): qty 200

## 2018-01-03 NOTE — Consult Note (Signed)
Cardiology Consultation:   Patient ID: [name] [name]; [id number]; [birth year]   Admit date: 01/02/2018 Date of Consult: 01/03/2018  Primary Care Provider: Clarisse Gouge, MD Primary Cardiologist: No primary care provider on file. [facility] in [place] now (had been [place])   Patient Profile:   [name] [name] is a 57 y.o. male with a history of extensive CAD and PAD, chronic systolic CHF, HTN, COPD, DM and tobacco abuse  who is being seen today for the evaluation of elevated troponin at the request of Dr. Posey Pronto.   History of Present Illness:   [name] [name] is a 57 yo male with a history of ischemic cardiomyopathy, CAD, chronic systolic CHF, PAD, HTN, COPD, DM and tobacco abuse admitted with possible sepsis. He is followed closely in [facility] but had been followed by [facility] in the past. He saw Dr. Fletcher Anon in November 2018.  He had PCI of his LAD in 2013 and PCI of the RCA in 2014. He has had previous SFA stenting. Most recent cardiac cath November 2018 with severe three vessel CAD with patent stents in the LAD and RCA. There was a severe proximal RCA stenosis as well diffuse mid and distal disease. There was a significant stenosis in the proximal left circumflex and OM1 and moderate LAD disease. The coronaries were noted to be heavily calcified. LV systolic function is known to be severely reduced. He was referred by DR. Arida to see CT surgery but chose to get another opinion at [facility]. Discussion ongoing regarding candidacy for CABG at [facility]. Echo 11/21/17 with LVEF=25-30%. No significant valve disease.   He is now admitted to [facility] on 01/02/18 with confusion, chills, fever and active right wound infection. His family reports worsening of symptoms for several days. He is found to have a right diabetic foot wound with surrounding erythema. His troponin was found to be elevated, with peak of 3.52 and now trending down. He is now on a heparin drip.   He tells me today that he has  dyspnea but no chest pain. He is confused while I am talking to him  Past Medical History:  Diagnosis Date  . Arthritis   . Asthma   . Atherosclerosis   . BPH (benign prostatic hyperplasia)   . Carotid arterial disease ([place])   . Charcot's joint of foot, right   . Chronic combined systolic (congestive) and diastolic (congestive) heart failure ([place])    a. 02/2013 EF 50% by LV gram; b. 04/2017 Echo: EF 25-30%. diff HK. Gr2 DD. Mod dil LA/RV. PASP 45mmHg.  [name] COPD (chronic obstructive pulmonary disease) ([place])   . Coronary artery disease    a. 2013 S/P PCI of LAD [facility]);  b. 03/2013 PCI: RCA 90p ( 2.5 x 23 mm DES); c. 02/2014 Cath: patent RCA stent->Med Rx; d. 04/2017 Cath: LM nl, LAd 30ost/p, 31m/d, D1 80ost, LCX 90p/m, OM1 85, RCA 70/20p, 32m, 70d->Referred for CT Surg-felt to be poor candidate.  . Diabetic neuropathy ([place])   . Diabetic ulcer of right foot ([place])   . Hernia   . Hyperlipidemia   . Hypertension   . Ischemic cardiomyopathy    a. 04/2017 Echo: EF 25-30%; b. 08/2017 Cardiac MRI ([facility]): EF 19%, sev glob HK. RVEF 20%, mod BAE, triv MR, mild-mod TR. Basal lateral subendocardial infarct (viable), inf/infsept, dist septal ischemia, basal to mid lat peri-infarct ischemia.  . Morbid obesity ([place])   . Neuropathy   . PAD (peripheral artery disease) ([place])  a. Followed by Dr. Lucky Cowboy; b. 08/2010 Periph Angio: RSFA 70-80p (6X60 self-expanding stent); c. 09/2010 Periph Angio: L SFA 100p (7x unknown length self-expanding stent); d. 06/2015 Periph Angio: R SFA short segment occlusion (6x12 self-expanding stent).  . Restless leg syndrome   . Sleep apnea   . Subclavian artery stenosis, right ([place])   . Syncope and collapse   . Tobacco use    a. 75+ yr hx - still smoking 1ppd, down from 2 ppd.  . Type II diabetes mellitus ([place])   . Varicose vein     Past Surgical History:  Procedure Laterality Date  . ABDOMINAL AORTAGRAM N/A 06/23/2013   Procedure: ABDOMINAL Maxcine Ham;  Surgeon: Wellington Hampshire,  MD;  Location: [facility];  Service: Cardiovascular;  Laterality: N/A;  . APPENDECTOMY    . CARDIAC CATHETERIZATION  10/14   [facility]; X1 STENT PROXIMAL RCA  . CARDIAC CATHETERIZATION  02/2010   [facility]  . CARDIAC CATHETERIZATION  02/21/2014   [facility]  . CLAVICLE SURGERY    . HERNIA REPAIR    . LEFT HEART CATH AND CORS/GRAFTS ANGIOGRAPHY N/A 04/28/2017   Procedure: LEFT HEART CATH AND CORONARY ANGIOGRAPHY;  Surgeon: Wellington Hampshire, MD;  Location: [place] CV [facility];  Service: Cardiovascular;  Laterality: N/A;  . PERIPHERAL ARTERIAL STENT GRAFT     x2 left/right  . PERIPHERAL VASCULAR CATHETERIZATION N/A 07/06/2015   Procedure: Abdominal Aortogram w/Lower Extremity;  Surgeon: Algernon Huxley, MD;  Location: [place] [facility];  Service: Cardiovascular;  Laterality: N/A;  . PERIPHERAL VASCULAR CATHETERIZATION  07/06/2015   Procedure: Lower Extremity Intervention;  Surgeon: Algernon Huxley, MD;  Location: [place] [facility];  Service: Cardiovascular;;     Home Medications:  Prior to Admission medications   Medication Sig Start Date End Date Taking? Authorizing Provider  albuterol (PROVENTIL HFA;VENTOLIN HFA) 108 (90 BASE) MCG/ACT inhaler Inhale 2 puffs into the lungs every 6 (six) hours as needed for wheezing or shortness of breath. 04/15/13  Yes Wellington Hampshire, MD  aspirin EC 81 MG tablet Take 1 tablet (81 mg total) by mouth daily. 05/11/15  Yes Wellington Hampshire, MD  atorvastatin (LIPITOR) 80 MG tablet Take 80 mg by mouth daily.   Yes [provider]  carvedilol (COREG) 6.25 MG tablet Take 1 tablet by mouth 2 (two) times daily. 09/10/17 09/10/18 Yes [provider]  clopidogrel (PLAVIX) 75 MG tablet Take 1 tablet by mouth daily. 08/22/17  Yes [provider]  doxycycline (VIBRAMYCIN) 100 MG capsule Take 1 capsule (100 mg total) by mouth 2 (two) times daily for 14 days. 12/23/17 01/06/18 Yes Gladstone Lighter, MD  furosemide (LASIX) 40 MG tablet Take 1 tablet (40 mg total) 2  (two) times daily by mouth. Patient taking differently: Take 40 mg by mouth daily as needed for fluid.  04/29/17 04/29/18 Yes Max Sane, MD  gabapentin (NEURONTIN) 800 MG tablet Take 1 tablet by mouth 4 (four) times daily. 08/14/17 08/14/18 Yes [provider]  Insulin Glargine (LANTUS SOLOSTAR) 100 UNIT/ML Solostar Pen Inject 70 Units into the skin at bedtime. Patient taking differently: Inject 40 Units into the skin at bedtime.  12/23/17  Yes Gladstone Lighter, MD  insulin lispro (HUMALOG) 100 UNIT/ML injection Inject 0-10 Units into the skin 3 (three) times daily with meals. If BS is 0 to 70 no insulin and eat/glucose tablets/glucose get If BS 71 to 150 great! If BS 151 to 200, 2 Units Humaog If BS 201 to 250, 4  Units Humalog If BS 251 to 300, 6 Units Humalog If BS 301 to 350, 8 Units Humalog If BS 351 to 400, 10 Units Humalog If BS greater than 400, take 10 Units and Contact our office during office hour   Yes [provider]  ipratropium-albuterol (DUONEB) 0.5-2.5 (3) MG/3ML SOLN Inhale 3 mLs into the lungs every 6 (six) hours as needed (wheezing/sob).    Yes [provider]  lisinopril (PRINIVIL,ZESTRIL) 5 MG tablet Take 1 tablet by mouth daily. 09/10/17 09/10/18 Yes [provider]  metFORMIN (GLUCOPHAGE-XR) 500 MG 24 hr tablet Take 500 mg by mouth daily with breakfast.   Yes [provider]  nitroGLYCERIN (NITRODUR - DOSED IN MG/24 HR) 0.4 mg/hr patch Place 1 patch onto the skin daily.  08/14/17  Yes [provider]  nitroGLYCERIN (NITROSTAT) 0.4 MG SL tablet Place 0.4 mg under the tongue every 5 (five) minutes as needed for chest pain.   Yes [provider]  oxyCODONE (OXY IR/ROXICODONE) 5 MG immediate release tablet Take 5-10 mg by mouth every 6 (six) hours as needed for severe pain.    Yes [provider]  traZODone (DESYREL) 50 MG tablet Take 1 tablet by mouth at bedtime. 08/12/17  Yes [provider]  nicotine  (NICODERM CQ - DOSED IN MG/24 HOURS) 14 mg/24hr patch Place 1 patch (14 mg total) onto the skin daily. Patient not taking: Reported on 01/02/2018 11/23/17   Salary, Avel Peace, MD  predniSONE (STERAPRED UNI-PAK 21 TAB) 10 MG (21) TBPK tablet 6 tabs PO x 1 day 5 tabs PO x 1 day 4 tabs PO x 1 day 3 tabs PO x 1 day 2 tabs PO x 1 day 1 tab PO x 1 day and stop Patient not taking: Reported on 01/02/2018 12/23/17   Gladstone Lighter, MD    Inpatient Medications: Scheduled Meds: . aspirin EC  81 mg Oral Daily  . atorvastatin  80 mg Oral Daily  . carvedilol  6.25 mg Oral BID  . clopidogrel  75 mg Oral Daily  . insulin aspart  0-9 Units Subcutaneous Q6H  . insulin glargine  40 Units Subcutaneous QHS  . povidone-iodine   Topical Once  . traZODone  50 mg Oral QHS   Continuous Infusions: . ceFEPime (MAXIPIME) IV 2 g (01/03/18 1047)  . heparin 1,550 Units/hr (01/03/18 1051)  . vancomycin Stopped (01/03/18 0724)   PRN Meds: acetaminophen **OR** acetaminophen, ondansetron **OR** ondansetron (ZOFRAN) IV, oxyCODONE  Allergies:    Allergies  Allergen Reactions  . Dulaglutide Anaphylaxis, Diarrhea and Hives  . Prednisone Rash    Social History:   Social History   Socioeconomic History  . Marital status: Single    Spouse name: Not on file  . Number of children: 0  . Years of education: 68  . Highest education level: 12th grade  Occupational History  . Occupation: on disability  Social Needs  . Financial resource strain: Somewhat hard  . Food insecurity:    Worry: Sometimes true    Inability: Sometimes true  . Transportation needs:    Medical: No    Non-medical: No  Tobacco Use  . Smoking status: Current Every Day Smoker    Packs/day: 1.00    Years: 41.00    Pack years: 41.00    Types: Cigarettes  . Smokeless tobacco: Never Used  Substance and Sexual Activity  . Alcohol use: No  . Drug use: No  . Sexual activity: Never  Lifestyle  . Physical activity:  Days per week: 7  days    Minutes per session: 30 min  . Stress: Very much  Relationships  . Social connections:    Talks on phone: More than three times a week    Gets together: Once a week    Attends religious service: Never    Active member of club or organization: No    Attends meetings of clubs or organizations: Never    Relationship status: Living with partner  . Intimate partner violence:    Fear of current or ex partner: No    Emotionally abused: No    Physically abused: No    Forced sexual activity: No  Other Topics Concern  . Not on file  Social History Narrative   Lives at home in [place] with family. Independent at baseline.    Family History:    Family History  Problem Relation Age of Onset  . Heart attack Father   . Heart disease Father   . Hypertension Father   . Hyperlipidemia Father   . Hypertension Mother   . Hyperlipidemia Mother      ROS:  Please see the history of present illness.  All other ROS reviewed and negative.     Physical Exam/Data:   Vitals:   01/03/18 0249 01/03/18 0317 01/03/18 0337 01/03/18 0748  BP: 104/72  106/68 115/80  Pulse: 99  99 (!) 129  Resp: 15  18 18   Temp:  98.6 F (37 C) 98.6 F (37 C) 100.2 F (37.9 C)  TempSrc:  Oral Oral Oral  SpO2: 99%  97% 91%  Weight:   231 lb 14.4 oz (105.2 kg)   Height:        Intake/Output Summary (Last 24 hours) at 01/03/2018 1058 Last data filed at 01/03/2018 0553 Gross per 24 hour  Intake 1300 ml  Output 0 ml  Net 1300 ml   Filed Weights   01/02/18 2121 01/02/18 2213 01/03/18 0337  Weight: 233 lb (105.7 kg) 237 lb (107.5 kg) 231 lb 14.4 oz (105.2 kg)   Body mass index is 36.32 kg/m.  General:  Well nourished, well developed, appears agitated HEENT: normal Lymph: no adenopathy Neck: no JVD Endocrine:  No thryomegaly Vascular: No carotid bruits; FA pulses 2+ bilaterally without bruits  Cardiac:  Tachy, no loud murmurs Lungs:  Diffuse wheezes and rhonci in both lungs.   Abd: soft,  nontender, no hepatomegaly  Ext: no edema Musculoskeletal: Right leg is warm to touch. Bandage over right foot.  Skin: warm and dry  Neuro:  CNs 2-12 intact, no focal abnormalities noted Psych:  Flat affect  EKG:  The EKG was personally reviewed and demonstrates:  Sinus tach, RBBB 01/02/18 Telemetry:  Telemetry was personally reviewed and demonstrates:  Sinus tach  Relevant CV Studies:   Laboratory Data:  Chemistry Recent Labs  Lab 01/02/18 2153 01/03/18 0453  NA 125* 129*  K 5.4* 4.4  CL 86* 92*  CO2 30 29  GLUCOSE 472* 360*  BUN 33* 34*  CREATININE 2.02* 1.84*  CALCIUM 8.5* 8.2*  GFRNONAA 35* 40*  GFRAA 41* 46*  ANIONGAP 9 8    Recent Labs  Lab 01/02/18 2153  PROT 7.1  ALBUMIN 2.6*  AST 62*  ALT 26  ALKPHOS 60  BILITOT 1.1   Hematology Recent Labs  Lab 01/02/18 2153 01/03/18 0453  WBC 20.6* 17.7*  RBC 3.19* 3.00*  HGB 9.0* 8.7*  HCT 26.7* 25.1*  MCV 83.9 83.8  MCH 28.1 28.9  MCHC  33.5 34.5  RDW 18.4* 18.0*  PLT 290 278   Cardiac Enzymes Recent Labs  Lab 01/02/18 2153 01/03/18 0137 01/03/18 0453  TROPONINI 2.95* 3.52* 3.09*   No results for input(s): TROPIPOC in the last 168 hours.  BNPNo results for input(s): BNP, PROBNP in the last 168 hours.  DDimer No results for input(s): DDIMER in the last 168 hours.  Radiology/Studies:  Dg Chest 2 View  Result Date: 01/02/2018 CLINICAL DATA:  Weakness and tremors. Gait abnormalities and memory issues. Sepsis. EXAM: CHEST - 2 VIEW COMPARISON:  12/21/2017 FINDINGS: Mild cardiac enlargement. No vascular congestion, edema, or consolidation. No blunting of costophrenic angles. No pneumothorax. Calcification of the aorta. Mediastinal contours appear intact. Degenerative changes in the spine and shoulders. Old resection or resorption of the distal right clavicle. IMPRESSION: Mild cardiac enlargement. No evidence of active pulmonary disease. Aortic atherosclerosis. Electronically Signed   By: Lucienne Capers M.D.    On: 01/02/2018 21:51   Dg Foot Complete Right  Result Date: 01/02/2018 CLINICAL DATA:  Diabetic ulcer at the base of the fifth metatarsal bone. EXAM: RIGHT FOOT COMPLETE - 3+ VIEW COMPARISON:  Right foot radiograph 12/22/2017. MRI right foot 12/23/2017 FINDINGS: Large soft tissue ulceration over the plantar surface of the lateral right foot at the level of the fifth metatarsal base. This was present on the previous study. In the interval, there is development of soft tissue gas along the dorsum of the right foot. This likely reflects progression of infection with gas-forming organism. No definite radiographic changes to suggest osteomyelitis, but changes of osteomyelitis were demonstrated in the fourth and fifth metatarsal bones on the previous MRI. Degenerative changes in the interphalangeal and intertarsal joints. Plantar calcaneal spur. No radiopaque soft tissue foreign bodies. IMPRESSION: Large soft tissue ulceration over the plantar surface of the right foot at the level of the fifth metatarsal base as seen previously. New soft tissue gas along the dorsum of the right foot likely representing progression of infection with gas-forming organism. No radiographic evidence of osteomyelitis, but previous MRI showed evidence of osteomyelitis in the fourth and fifth metatarsal bones. Electronically Signed   By: Lucienne Capers M.D.   On: 01/02/2018 22:46    Assessment and Plan:   1. Elevated troponin 2. Severe three vessel CAD awaiting CABG when medically optimized 3. Ischemic cardiomyopathy/chronic systolic CHF  His elevated troponin is likely due to demand ischemia in setting of sepsis with severe underlying CAD. His troponin is now trending down. OK to use IV heparin but this can likely be stopped later today. Continue ASA, Plavix, statin and beta blocker. No plans for ischemic testing at this time. Echo pending today.   For questions or updates, please contact [place] Please consult  [email or web address] for contact info under Cardiology/STEMI.   Signed, Lauree Chandler, MD  01/03/2018 10:58 AM

## 2018-01-03 NOTE — Progress Notes (Signed)
PHARMACY - PHYSICIAN COMMUNICATION CRITICAL VALUE ALERT - BLOOD CULTURE IDENTIFICATION (BCID)  [name] [name] is an 57 y.o. male who presented to [facility] on 01/02/2018 with a chief complaint of Right foot infection  Assessment:   (include suspected source if known)  Name of physician (or Provider) Contacted: Posey Pronto  Current antibiotics: Vancomycin and Cefepime  Changes to prescribed antibiotics recommended:  Recommendations declined by provider due to not evaluating patient yet this morning. Will make decision once evaluated.   Results for orders placed or performed during the hospital encounter of 01/02/18  Blood Culture ID Panel (Reflexed) (Collected: 01/02/2018  9:53 PM)  Result Value Ref Range   Enterococcus species NOT DETECTED NOT DETECTED   Listeria monocytogenes NOT DETECTED NOT DETECTED   Staphylococcus species NOT DETECTED NOT DETECTED   Staphylococcus aureus NOT DETECTED NOT DETECTED   Streptococcus species DETECTED (A) NOT DETECTED   Streptococcus agalactiae DETECTED (A) NOT DETECTED   Streptococcus pneumoniae NOT DETECTED NOT DETECTED   Streptococcus pyogenes NOT DETECTED NOT DETECTED   Acinetobacter baumannii NOT DETECTED NOT DETECTED   Enterobacteriaceae species NOT DETECTED NOT DETECTED   Enterobacter cloacae complex NOT DETECTED NOT DETECTED   Escherichia coli NOT DETECTED NOT DETECTED   Klebsiella oxytoca NOT DETECTED NOT DETECTED   Klebsiella pneumoniae NOT DETECTED NOT DETECTED   Proteus species NOT DETECTED NOT DETECTED   Serratia marcescens NOT DETECTED NOT DETECTED   Haemophilus influenzae NOT DETECTED NOT DETECTED   Neisseria meningitidis NOT DETECTED NOT DETECTED   Pseudomonas aeruginosa NOT DETECTED NOT DETECTED   Candida albicans NOT DETECTED NOT DETECTED   Candida glabrata NOT DETECTED NOT DETECTED   Candida krusei NOT DETECTED NOT DETECTED   Candida parapsilosis NOT DETECTED NOT DETECTED   Candida tropicalis NOT DETECTED NOT DETECTED     Colten Desroches D 01/03/2018  11:04 AM

## 2018-01-03 NOTE — Progress Notes (Signed)
ANTICOAGULATION CONSULT NOTE - FOLLOW UP  Pharmacy Consult for heparin drip Indication: chest pain/ACS  Allergies  Allergen Reactions  . Dulaglutide Anaphylaxis, Diarrhea and Hives  . Prednisone Rash    Patient Measurements: Height: 5\' 7"  (170.2 cm) Weight: 231 lb 14.4 oz (105.2 kg) IBW/kg (Calculated) : 66.1 Heparin Dosing Weight: 90 kg  Vital Signs: Temp: 100.2 F (37.9 C) (07/27 0748) Temp Source: Oral (07/27 0748) BP: 115/80 (07/27 0748) Pulse Rate: 129 (07/27 0748)  Labs: Recent Labs    01/02/18 2153  01/03/18 0453 01/03/18 0752 01/03/18 1026 01/03/18 1654  HGB 9.0*  --  8.7*  --   --   --   HCT 26.7*  --  25.1*  --   --   --   PLT 290  --  278  --   --   --   APTT 41*  --   --   --   --   --   LABPROT 15.2  --   --   --   --   --   INR 1.21  --   --   --   --   --   HEPARINUNFRC  --   --   --  <0.10*  --  <0.10*  CREATININE 2.02*  --  1.84*  --   --   --   TROPONINI 2.95*   < > 3.09*  --  1.95* 2.49*   < > = values in this interval not displayed.    Estimated Creatinine Clearance: 52.4 mL/min (A) (by C-G formula based on SCr of 1.84 mg/dL (H)).   Medical History: Past Medical History:  Diagnosis Date  . Arthritis   . Asthma   . Atherosclerosis   . BPH (benign prostatic hyperplasia)   . Carotid arterial disease ([place])   . Charcot's joint of foot, right   . Chronic combined systolic (congestive) and diastolic (congestive) heart failure ([place])    a. 02/2013 EF 50% by LV gram; b. 04/2017 Echo: EF 25-30%. diff HK. Gr2 DD. Mod dil LA/RV. PASP 25mmHg.  [name] COPD (chronic obstructive pulmonary disease) ([place])   . Coronary artery disease    a. 2013 S/P PCI of LAD [facility]);  b. 03/2013 PCI: RCA 90p ( 2.5 x 23 mm DES); c. 02/2014 Cath: patent RCA stent->Med Rx; d. 04/2017 Cath: LM nl, LAd 30ost/p, 86m/d, D1 80ost, LCX 90p/m, OM1 85, RCA 70/20p, 6m, 70d->Referred for CT Surg-felt to be poor candidate.  . Diabetic neuropathy ([place])   . Diabetic ulcer of right foot ([place])    . Hernia   . Hyperlipidemia   . Hypertension   . Ischemic cardiomyopathy    a. 04/2017 Echo: EF 25-30%; b. 08/2017 Cardiac MRI ([facility]): EF 19%, sev glob HK. RVEF 20%, mod BAE, triv MR, mild-mod TR. Basal lateral subendocardial infarct (viable), inf/infsept, dist septal ischemia, basal to mid lat peri-infarct ischemia.  . Morbid obesity ([place])   . Neuropathy   . PAD (peripheral artery disease) ([place])    a. Followed by Dr. Lucky Cowboy; b. 08/2010 Periph Angio: RSFA 70-80p (6X60 self-expanding stent); c. 09/2010 Periph Angio: L SFA 100p (7x unknown length self-expanding stent); d. 06/2015 Periph Angio: R SFA short segment occlusion (6x12 self-expanding stent).  . Restless leg syndrome   . Sleep apnea   . Subclavian artery stenosis, right ([place])   . Syncope and collapse   . Tobacco use    a. 75+ yr hx - still smoking 1ppd, down from 2 ppd.  [name]  Type II diabetes mellitus ([place])   . Varicose vein     Medications:  No anticoag in PTA meds.   Assessment: Trop 2.95. Pharmacy consulted for heparin drip dosing and montioring in 57yo patient for ACS.   Goal of Therapy:  Heparin level 0.3-0.7 units/ml Monitor platelets by anticoagulation protocol: Yes   Plan:   7/27 @ 1733 HL <0.10. Level is subtherapeutic. Will order 2700 unit bolus and increase infusion to heparin 1900 units/hr.  Recheck HL in 6 hours. CBC with am labs per protocol.   Pernell Dupre, PharmD, BCPS Clinical Pharmacist 01/03/2018 5:43 PM

## 2018-01-03 NOTE — Progress Notes (Signed)
ANTICOAGULATION CONSULT NOTE - FOLLOW UP  Pharmacy Consult for heparin drip Indication: chest pain/ACS  Allergies  Allergen Reactions  . Dulaglutide Anaphylaxis, Diarrhea and Hives  . Prednisone Rash    Patient Measurements: Height: 5\' 7"  (170.2 cm) Weight: 231 lb 14.4 oz (105.2 kg) IBW/kg (Calculated) : 66.1 Heparin Dosing Weight: 90 kg  Vital Signs: Temp: 100.2 F (37.9 C) (07/27 0748) Temp Source: Oral (07/27 0748) BP: 115/80 (07/27 0748) Pulse Rate: 129 (07/27 0748)  Labs: Recent Labs    01/02/18 2153 01/03/18 0137 01/03/18 0453 01/03/18 0752  HGB 9.0*  --  8.7*  --   HCT 26.7*  --  25.1*  --   PLT 290  --  278  --   APTT 41*  --   --   --   LABPROT 15.2  --   --   --   INR 1.21  --   --   --   HEPARINUNFRC  --   --   --  <0.10*  CREATININE 2.02*  --  1.84*  --   TROPONINI 2.95* 3.52* 3.09*  --     Estimated Creatinine Clearance: 52.4 mL/min (A) (by C-G formula based on SCr of 1.84 mg/dL (H)).   Medical History: Past Medical History:  Diagnosis Date  . Arthritis   . Asthma   . Atherosclerosis   . BPH (benign prostatic hyperplasia)   . Carotid arterial disease ([place])   . Charcot's joint of foot, right   . Chronic combined systolic (congestive) and diastolic (congestive) heart failure ([place])    a. 02/2013 EF 50% by LV gram; b. 04/2017 Echo: EF 25-30%. diff HK. Gr2 DD. Mod dil LA/RV. PASP 57mmHg.  [name] COPD (chronic obstructive pulmonary disease) ([place])   . Coronary artery disease    a. 2013 S/P PCI of LAD [facility]);  b. 03/2013 PCI: RCA 90p ( 2.5 x 23 mm DES); c. 02/2014 Cath: patent RCA stent->Med Rx; d. 04/2017 Cath: LM nl, LAd 30ost/p, 38m/d, D1 80ost, LCX 90p/m, OM1 85, RCA 70/20p, 18m, 70d->Referred for CT Surg-felt to be poor candidate.  . Diabetic neuropathy ([place])   . Diabetic ulcer of right foot ([place])   . Hernia   . Hyperlipidemia   . Hypertension   . Ischemic cardiomyopathy    a. 04/2017 Echo: EF 25-30%; b. 08/2017 Cardiac MRI ([facility]): EF 19%, sev glob HK.  RVEF 20%, mod BAE, triv MR, mild-mod TR. Basal lateral subendocardial infarct (viable), inf/infsept, dist septal ischemia, basal to mid lat peri-infarct ischemia.  . Morbid obesity ([place])   . Neuropathy   . PAD (peripheral artery disease) ([place])    a. Followed by Dr. Lucky Cowboy; b. 08/2010 Periph Angio: RSFA 70-80p (6X60 self-expanding stent); c. 09/2010 Periph Angio: L SFA 100p (7x unknown length self-expanding stent); d. 06/2015 Periph Angio: R SFA short segment occlusion (6x12 self-expanding stent).  . Restless leg syndrome   . Sleep apnea   . Subclavian artery stenosis, right ([place])   . Syncope and collapse   . Tobacco use    a. 75+ yr hx - still smoking 1ppd, down from 2 ppd.  . Type II diabetes mellitus ([place])   . Varicose vein     Medications:  No anticoag in PTA meds  Assessment: Trop 2.95   Goal of Therapy:  Heparin level 0.3-0.7 units/ml Monitor platelets by anticoagulation protocol: Yes   Plan:   Will give Heparin bolus of 2700 units x 1 and will then increase heparin 1550 units/hr. Will recheck  Heparin level @ 1600.     Daisey Caloca D 01/03/2018,9:27 AM

## 2018-01-03 NOTE — Progress Notes (Signed)
Pharmacy Antibiotic Note  [name] [name] is a 57 y.o. male admitted on 01/02/2018 with sepsis.  Pharmacy has been consulted for vancomycin and cefepime dosing.  Plan: DW 83kg  Vd 58L kei 0.044 hr-1  T1/2 16 hours Vancomycin 1 gram q 18 hours ordered with stacked dosing. Level before 5th dose. Goal trough 15-20.  Cefepime 2 grams q 12 hours ordered.  Height: 5\' 7"  (170.2 cm) Weight: 237 lb (107.5 kg) IBW/kg (Calculated) : 66.1  Temp (24hrs), Avg:102.6 F (39.2 C), Min:102.6 F (39.2 C), Max:102.6 F (39.2 C)  Recent Labs  Lab 01/02/18 2153 01/03/18 0025  WBC 20.6*  --   CREATININE 2.02*  --   LATICACIDVEN 2.3* 1.3    Estimated Creatinine Clearance: 48.3 mL/min (A) (by C-G formula based on SCr of 2.02 mg/dL (H)).    Allergies  Allergen Reactions  . Dulaglutide Anaphylaxis, Diarrhea and Hives  . Prednisone Rash    Antimicrobials this admission: 7/26 Vancomycin, cefepime  >>    >>   Dose adjustments this admission:   Microbiology results: 7/26 BCx: pending 7/26 UCx: pending       7/26 CXR: no active disease 7/26 UA: pending draw  Thank you for allowing pharmacy to be a part of this patient's care.  Lena Gores S 01/03/2018 1:00 AM

## 2018-01-03 NOTE — Progress Notes (Signed)
Pt complains of shortness of breath at rest. Lungs show rhonchi and exp to ascultation, oxygen saturation is 85% on room air. Oxygen added at 2L via [place], IVF slowed to [facility] and MD notified. I will await any new orders.

## 2018-01-03 NOTE — Progress Notes (Signed)
ANTICOAGULATION CONSULT NOTE - Initial Consult  Pharmacy Consult for heparin drip Indication: chest pain/ACS  Allergies  Allergen Reactions  . Dulaglutide Anaphylaxis, Diarrhea and Hives  . Prednisone Rash    Patient Measurements: Height: 5\' 7"  (170.2 cm) Weight: 237 lb (107.5 kg) IBW/kg (Calculated) : 66.1 Heparin Dosing Weight: 90 kg  Vital Signs: Temp: 98.6 F (37 C) (07/27 0337) Temp Source: Oral (07/27 0337) BP: 106/68 (07/27 0337) Pulse Rate: 99 (07/27 0337)  Labs: Recent Labs    01/02/18 2153 01/03/18 0137  HGB 9.0*  --   HCT 26.7*  --   PLT 290  --   APTT 41*  --   LABPROT 15.2  --   INR 1.21  --   CREATININE 2.02*  --   TROPONINI 2.95* 3.52*    Estimated Creatinine Clearance: 48.3 mL/min (A) (by C-G formula based on SCr of 2.02 mg/dL (H)).   Medical History: Past Medical History:  Diagnosis Date  . Arthritis   . Asthma   . Atherosclerosis   . BPH (benign prostatic hyperplasia)   . Carotid arterial disease ([place])   . Charcot's joint of foot, right   . Chronic combined systolic (congestive) and diastolic (congestive) heart failure ([place])    a. 02/2013 EF 50% by LV gram; b. 04/2017 Echo: EF 25-30%. diff HK. Gr2 DD. Mod dil LA/RV. PASP 64mmHg.  [name] COPD (chronic obstructive pulmonary disease) ([place])   . Coronary artery disease    a. 2013 S/P PCI of LAD [facility]);  b. 03/2013 PCI: RCA 90p ( 2.5 x 23 mm DES); c. 02/2014 Cath: patent RCA stent->Med Rx; d. 04/2017 Cath: LM nl, LAd 30ost/p, 42m/d, D1 80ost, LCX 90p/m, OM1 85, RCA 70/20p, 67m, 70d->Referred for CT Surg-felt to be poor candidate.  . Diabetic neuropathy ([place])   . Diabetic ulcer of right foot ([place])   . Hernia   . Hyperlipidemia   . Hypertension   . Ischemic cardiomyopathy    a. 04/2017 Echo: EF 25-30%; b. 08/2017 Cardiac MRI ([facility]): EF 19%, sev glob HK. RVEF 20%, mod BAE, triv MR, mild-mod TR. Basal lateral subendocardial infarct (viable), inf/infsept, dist septal ischemia, basal to mid lat peri-infarct  ischemia.  . Morbid obesity ([place])   . Neuropathy   . PAD (peripheral artery disease) ([place])    a. Followed by Dr. Lucky Cowboy; b. 08/2010 Periph Angio: RSFA 70-80p (6X60 self-expanding stent); c. 09/2010 Periph Angio: L SFA 100p (7x unknown length self-expanding stent); d. 06/2015 Periph Angio: R SFA short segment occlusion (6x12 self-expanding stent).  . Restless leg syndrome   . Sleep apnea   . Subclavian artery stenosis, right ([place])   . Syncope and collapse   . Tobacco use    a. 75+ yr hx - still smoking 1ppd, down from 2 ppd.  . Type II diabetes mellitus ([place])   . Varicose vein     Medications:  No anticoag in PTA meds  Assessment: Trop 2.95   Goal of Therapy:  Heparin level 0.3-0.7 units/ml Monitor platelets by anticoagulation protocol: Yes   Plan:   4000 unit bolus and initial rate of 1200 units/hr. First heparin level 6 hours after start of infusion.  Cyril Woodmansee S 01/03/2018,4:18 AM

## 2018-01-03 NOTE — Progress Notes (Signed)
[place] at [facility]                                                                                                                                                                                  Patient Demographics   [name] [name], is a 57 y.o. male, DOB - 07-01-1960, ZSW:[id number]  Admit date - 01/02/2018   Admitting Physician Lance Coon, MD  Outpatient Primary MD for the patient is Clarisse Gouge, MD   LOS - 1  Subjective: Patient admitted with sepsis related to his foot infection.  States that he is hungry patient is currently n.p.o. for unclear reasons Patient complains of pain in his foot   Review of Systems:   CONSTITUTIONAL: No documented fever. No fatigue, weakness. No weight gain, no weight loss.  EYES: No blurry or double vision.  ENT: No tinnitus. No postnasal drip. No redness of the oropharynx.  RESPIRATORY: No cough, no wheeze, no hemoptysis. No dyspnea.  CARDIOVASCULAR: No chest pain. No orthopnea. No palpitations. No syncope.  GASTROINTESTINAL: No nausea, no vomiting or diarrhea. No abdominal pain. No melena or hematochezia.  GENITOURINARY: No dysuria or hematuria.  ENDOCRINE: No polyuria or nocturia. No heat or cold intolerance.  HEMATOLOGY: No anemia. No bruising. No bleeding.  INTEGUMENTARY: No rashes. No lesions.  MUSCULOSKELETAL: Positive foot pain NEUROLOGIC: No numbness, tingling, or ataxia. No seizure-type activity.  PSYCHIATRIC: No anxiety. No insomnia. No ADD.    Vitals:   Vitals:   01/03/18 0249 01/03/18 0317 01/03/18 0337 01/03/18 0748  BP: 104/72  106/68 115/80  Pulse: 99  99 (!) 129  Resp: 15  18 18   Temp:  98.6 F (37 C) 98.6 F (37 C) 100.2 F (37.9 C)  TempSrc:  Oral Oral Oral  SpO2: 99%  97% 91%  Weight:   105.2 kg (231 lb 14.4 oz)   Height:        Wt Readings from Last 3 Encounters:  01/03/18 105.2 kg (231 lb 14.4 oz)  12/23/17 105.9 kg (233 lb 7.5 oz)  11/23/17 104.5 kg (230 lb 4.8 oz)      Intake/Output Summary (Last 24 hours) at 01/03/2018 1511 Last data filed at 01/03/2018 1000 Gross per 24 hour  Intake 1300 ml  Output 400 ml  Net 900 ml    Physical Exam:   GENERAL: Pleasant-appearing in no apparent distress.  HEAD, EYES, EARS, NOSE AND THROAT: Atraumatic, normocephalic. Extraocular muscles are intact. Pupils equal and reactive to light. Sclerae anicteric. No conjunctival injection. No oro-pharyngeal erythema.  NECK: Supple. There is no jugular venous distention. No bruits, no lymphadenopathy, no thyromegaly.  HEART: Regular rate and rhythm,. No murmurs, no  rubs, no clicks.  LUNGS: Clear to auscultation bilaterally. No rales or rhonchi. No wheezes.  ABDOMEN: Soft, flat, nontender, nondistended. Has good bowel sounds. No hepatosplenomegaly appreciated.  EXTREMITIES: Right foot with ulceration NEUROLOGIC: The patient is alert, awake, and oriented x3 with no focal motor or sensory deficits appreciated bilaterally.  SKIN: Moist and warm with no rashes appreciated.  Psych: Not anxious, depressed LN: No inguinal LN enlargement    Antibiotics   Anti-infectives (From admission, onward)   Start     Dose/Rate Route Frequency Ordered Stop   01/03/18 1000  ceFEPIme (MAXIPIME) 2 g in sodium chloride 0.9 % 100 mL IVPB     2 g 200 mL/hr over 30 Minutes Intravenous Every 12 hours 01/03/18 0052     01/03/18 0600  vancomycin (VANCOCIN) IVPB 1000 mg/200 mL premix     1,000 mg 200 mL/hr over 60 Minutes Intravenous Every 18 hours 01/03/18 0052     01/02/18 2230  metroNIDAZOLE (FLAGYL) IVPB 500 mg     500 mg 100 mL/hr over 60 Minutes Intravenous  Once 01/02/18 2227 01/03/18 0032   01/02/18 2145  ceFEPIme (MAXIPIME) 2 g in sodium chloride 0.9 % 100 mL IVPB     2 g 200 mL/hr over 30 Minutes Intravenous  Once 01/02/18 2133 01/02/18 2310   01/02/18 2145  vancomycin (VANCOCIN) IVPB 1000 mg/200 mL premix     1,000 mg 200 mL/hr over 60 Minutes Intravenous  Once 01/02/18 2133  01/02/18 2335      Medications   Scheduled Meds: . aspirin EC  81 mg Oral Daily  . atorvastatin  80 mg Oral Daily  . carvedilol  6.25 mg Oral BID  . clopidogrel  75 mg Oral Daily  . insulin aspart  0-9 Units Subcutaneous TID WC  . insulin glargine  40 Units Subcutaneous QHS  . ipratropium-albuterol  3 mL Nebulization Q6H  . povidone-iodine   Topical Once  . traZODone  50 mg Oral QHS   Continuous Infusions: . ceFEPime (MAXIPIME) IV Stopped (01/03/18 1117)  . heparin 1,550 Units/hr (01/03/18 1051)  . vancomycin Stopped (01/03/18 0724)   PRN Meds:.acetaminophen **OR** acetaminophen, ondansetron **OR** ondansetron (ZOFRAN) IV, oxyCODONE   Data Review:   Micro Results Recent Results (from the past 240 hour(s))  Blood Culture (routine x 2)     Status: None (Preliminary result)   Collection Time: 01/02/18  9:53 PM  Result Value Ref Range Status   Specimen Description BLOOD LEFT WRIST  Final   Special Requests   Final    BOTTLES DRAWN AEROBIC AND ANAEROBIC Blood Culture results may not be optimal due to an inadequate volume of blood received in culture bottles   Culture   Final    NO GROWTH < 12 HOURS Performed at [facility], [address] [place],  [address]    Report Status PENDING  Incomplete  Blood Culture (routine x 2)     Status: None (Preliminary result)   Collection Time: 01/02/18  9:53 PM  Result Value Ref Range Status   Specimen Description BLOOD LEFT ANTECUBITAL  Final   Special Requests   Final    BOTTLES DRAWN AEROBIC AND ANAEROBIC Blood Culture results may not be optimal due to an inadequate volume of blood received in culture bottles   Culture  Setup Time   Final    Organism ID to follow GRAM POSITIVE COCCI IN BOTH AEROBIC AND ANAEROBIC BOTTLES CRITICAL RESULT CALLED TO, READ BACK BY AND VERIFIED WITH: Dicie Beam  01/03/18 @ [place] Performed at [facility], [address] [place], [place] [address]    Culture GRAM  POSITIVE COCCI  Final   Report Status PENDING  Incomplete  Blood Culture ID Panel (Reflexed)     Status: Abnormal   Collection Time: 01/02/18  9:53 PM  Result Value Ref Range Status   Enterococcus species NOT DETECTED NOT DETECTED Final   Listeria monocytogenes NOT DETECTED NOT DETECTED Final   Staphylococcus species NOT DETECTED NOT DETECTED Final   Staphylococcus aureus NOT DETECTED NOT DETECTED Final   Streptococcus species DETECTED (A) NOT DETECTED Final    Comment: CRITICAL RESULT CALLED TO, READ BACK BY AND VERIFIED WITH: TELDRIN JAMES 01/03/18 @ 1055  [place]    Streptococcus agalactiae DETECTED (A) NOT DETECTED Final    Comment: CRITICAL RESULT CALLED TO, READ BACK BY AND VERIFIED WITH: Dicie Beam 01/03/18 @ 1055  [place]    Streptococcus pneumoniae NOT DETECTED NOT DETECTED Final   Streptococcus pyogenes NOT DETECTED NOT DETECTED Final   Acinetobacter baumannii NOT DETECTED NOT DETECTED Final   Enterobacteriaceae species NOT DETECTED NOT DETECTED Final   Enterobacter cloacae complex NOT DETECTED NOT DETECTED Final   Escherichia coli NOT DETECTED NOT DETECTED Final   Klebsiella oxytoca NOT DETECTED NOT DETECTED Final   Klebsiella pneumoniae NOT DETECTED NOT DETECTED Final   Proteus species NOT DETECTED NOT DETECTED Final   Serratia marcescens NOT DETECTED NOT DETECTED Final   Haemophilus influenzae NOT DETECTED NOT DETECTED Final   Neisseria meningitidis NOT DETECTED NOT DETECTED Final   Pseudomonas aeruginosa NOT DETECTED NOT DETECTED Final   Candida albicans NOT DETECTED NOT DETECTED Final   Candida glabrata NOT DETECTED NOT DETECTED Final   Candida krusei NOT DETECTED NOT DETECTED Final   Candida parapsilosis NOT DETECTED NOT DETECTED Final   Candida tropicalis NOT DETECTED NOT DETECTED Final    Comment: Performed at [facility], [address], [place], [place] [address]    Radiology Reports Dg Chest 2 View  Result Date: 01/02/2018 CLINICAL DATA:  Weakness and  tremors. Gait abnormalities and memory issues. Sepsis. EXAM: CHEST - 2 VIEW COMPARISON:  12/21/2017 FINDINGS: Mild cardiac enlargement. No vascular congestion, edema, or consolidation. No blunting of costophrenic angles. No pneumothorax. Calcification of the aorta. Mediastinal contours appear intact. Degenerative changes in the spine and shoulders. Old resection or resorption of the distal right clavicle. IMPRESSION: Mild cardiac enlargement. No evidence of active pulmonary disease. Aortic atherosclerosis. Electronically Signed   By: Lucienne Capers M.D.   On: 01/02/2018 21:51   Dg Chest 2 View  Result Date: 12/21/2017 CLINICAL DATA:  Patient reports upper chest pain (points to base of neck) all day that has gotten worse over the days. Hx/o asthma, carotid arterial disease, CHF, COPD, PAD, and diabetes Smoker EXAM: CHEST - 2 VIEW COMPARISON:  11/21/2017 FINDINGS: Mild hyperinflation. Midline trachea. Normal heart size. Atherosclerosis in the transverse aorta. No pleural effusion or pneumothorax. Diffuse peribronchial thickening. Clear lungs. IMPRESSION: No acute cardiopulmonary disease. Hyperinflation and mild interstitial thickening, most consistent with COPD/chronic bronchitis. Aortic Atherosclerosis (ICD10-I70.0). Electronically Signed   By: Abigail Miyamoto M.D.   On: 12/21/2017 23:35   Ct Chest Wo Contrast  Result Date: 12/22/2017 CLINICAL DATA:  57 year old male with chest pain and shortness of breath. EXAM: CT CHEST WITHOUT CONTRAST TECHNIQUE: Multidetector CT imaging of the chest was performed following the standard protocol without IV contrast. COMPARISON:  Chest radiograph dated 12/21/2017 and CT dated 04/22/2017 FINDINGS: Evaluation of  this exam is limited in the absence of intravenous contrast. Cardiovascular: Top-normal cardiac size. No pericardial effusion. Advanced multi vessel coronary vascular calcification. There is mild atherosclerotic calcification of the thoracic aorta. The aorta and  central pulmonary arteries are otherwise grossly unremarkable on this noncontrast CT. Mediastinum/Nodes: No hilar or mediastinal adenopathy. Esophagus and the thyroid gland are grossly unremarkable. No mediastinal fluid collection. Lungs/Pleura: Bilateral ground-glass nodular densities most consistent with pneumonia, likely atypical in etiology. Clinical correlation and follow-up to resolution recommended. No focal consolidation, pleural effusion, or pneumothorax. Upper Abdomen: No acute abnormality. Musculoskeletal: Degenerative changes of the spine. No acute osseous pathology. IMPRESSION: 1. Innumerable small ground-glass pulmonary nodules most consistent with an inflammatory/infectious etiology. Clinical correlation and follow-up to resolution recommended. 2. Advanced multi vessel coronary vascular calcification. 3.  Aortic Atherosclerosis (ICD10-I70.0). Electronically Signed   By: Anner Crete M.D.   On: 12/22/2017 05:37   Mr Foot Right Wo Contrast  Result Date: 01/03/2018 CLINICAL DATA:  Large open wound near the base of the fifth metatarsal. EXAM: MRI OF THE RIGHT FOREFOOT WITHOUT CONTRAST TECHNIQUE: Multiplanar, multisequence MR imaging of the right foot was performed. No intravenous contrast was administered. COMPARISON:  Radiograph 01/02/2018 FINDINGS: Exam is somewhat limited by patient motion and lack of IV contrast. There is a large open wound on the plantar and lateral aspect of the midfoot at the level of the base of the fifth metatarsal. Diffuse surrounding cellulitis but no discrete drainable soft tissue abscess. Diffuse abnormal T1 and T2 signal intensity in the cuboid, fifth metatarsal and base of the fourth metatarsal. There is also fluid in the cuboid articulation with the fourth and fifth metatarsals. Findings consistent with septic arthritis and osteomyelitis. Mild edema like signal abnormality in the midfoot bony structures more medially likely related to advanced degenerative disease  but osteomyelitis can't be totally excluded. The tibiotalar and subtalar joints are maintained. Small joint effusions. Diffuse myositis without definite findings for pyomyositis. IMPRESSION: 1. Large plantar and lateral open wound with associated significant cellulitis and myofasciitis. No discrete drainable soft tissue abscess or pyomyositis. 2. MR findings consistent with septic arthritis at the cuboid articulation with the fourth and fifth metatarsal bases and associated osteomyelitis. 3. Midfoot degenerative changes and edema like signal abnormality, likely degenerative but could not exclude osteomyelitis. Electronically Signed   By: Marijo Sanes M.D.   On: 01/03/2018 13:37   Mr Foot Right Wo Contrast  Result Date: 12/23/2017 CLINICAL DATA:  Diabetic patient with a chronic skin ulceration on the lateral aspect of the right foot at the base of the fifth metatarsal. EXAM: MRI OF THE RIGHT FOREFOOT WITHOUT CONTRAST TECHNIQUE: Multiplanar, multisequence MR imaging of the right forefoot was performed. No intravenous contrast was administered. COMPARISON:  Plain films right foot 12/22/2017 and 07/10/2016. MRI right foot 05/06/2016 FINDINGS: Bones/Joint/Cartilage Marrow edema in the cuboid is most intense in the lateral 2 cm. There is intense marrow edema throughout the fifth metatarsal. Marrow edema is also identified in the fourth metatarsal and most intense in the proximal 2 cm. Calcaneocuboid joint effusion is noted. Marrow edema about the remaining tarsometatarsal joints is likely related to neuropathic change. Subchondral cysts are seen at the articulation of the navicular and medial cuneiform. No fracture. Ligaments Intact. Muscles and Tendons Atrophy of intrinsic musculature the foot is identified. No intramuscular fluid collection. Soft tissues A subcutaneous fluid collection at the base of the fifth metatarsal deep to a skin ulceration measures 0.7 cm craniocaudal by 0.4 cm transverse by 1.1 cm  long.  IMPRESSION: Skin ulceration at the base of the fifth metatarsal with marrow edema in the lateral 2 cm of the cuboid and throughout the fifth metatarsal consistent with osteomyelitis. Marrow edema is also seen throughout almost the entire fourth metatarsal but most intense in the proximal 2 cm where it is worrisome for osteomyelitis. Edema in the remainder of the fourth metatarsal may be reactive. Calcaneocuboid joint effusion worrisome for septic joint. Small fluid collection lateral to the base of the fifth metatarsal is likely an abscess. Charcot change of the midfoot. Electronically Signed   By: Inge Rise M.D.   On: 12/23/2017 12:23   Dg Foot Complete Right  Result Date: 01/02/2018 CLINICAL DATA:  Diabetic ulcer at the base of the fifth metatarsal bone. EXAM: RIGHT FOOT COMPLETE - 3+ VIEW COMPARISON:  Right foot radiograph 12/22/2017. MRI right foot 12/23/2017 FINDINGS: Large soft tissue ulceration over the plantar surface of the lateral right foot at the level of the fifth metatarsal base. This was present on the previous study. In the interval, there is development of soft tissue gas along the dorsum of the right foot. This likely reflects progression of infection with gas-forming organism. No definite radiographic changes to suggest osteomyelitis, but changes of osteomyelitis were demonstrated in the fourth and fifth metatarsal bones on the previous MRI. Degenerative changes in the interphalangeal and intertarsal joints. Plantar calcaneal spur. No radiopaque soft tissue foreign bodies. IMPRESSION: Large soft tissue ulceration over the plantar surface of the right foot at the level of the fifth metatarsal base as seen previously. New soft tissue gas along the dorsum of the right foot likely representing progression of infection with gas-forming organism. No radiographic evidence of osteomyelitis, but previous MRI showed evidence of osteomyelitis in the fourth and fifth metatarsal bones.  Electronically Signed   By: Lucienne Capers M.D.   On: 01/02/2018 22:46   Dg Foot Complete Right  Result Date: 12/22/2017 CLINICAL DATA:  57 year old male with diabetic ulcer on the plantar right foot. EXAM: RIGHT FOOT COMPLETE - 3+ VIEW COMPARISON:  Right foot radiograph dated 07/10/2016 FINDINGS: There is no acute fracture or dislocation. Degenerative changes of the midfoot primarily involving the third tarsometatarsal joint. No periosteal reaction or erosive changes to suggest acute osteomyelitis. There is a large ulcer of the lateral aspect of the plantar soft tissues of the midfoot over the base of the fifth metatarsal which appears larger compared to the prior radiograph. There is diffuse soft tissue edema. No radiopaque foreign object. IMPRESSION: 1. Large soft tissue ulcer at the lateral plantar midfoot. 2. No acute fracture or dislocation. No radiographic evidence of acute osteomyelitis. MRI or a WBC nuclear scan may provide better evaluation if there is high clinical concern for acute osteomyelitis. Electronically Signed   By: Anner Crete M.D.   On: 12/22/2017 05:42     CBC Recent Labs  Lab 01/02/18 2153 01/03/18 0453  WBC 20.6* 17.7*  HGB 9.0* 8.7*  HCT 26.7* 25.1*  PLT 290 278  MCV 83.9 83.8  MCH 28.1 28.9  MCHC 33.5 34.5  RDW 18.4* 18.0*  LYMPHSABS 0.8*  --   MONOABS 1.5*  --   EOSABS 0.0  --   BASOSABS 0.0  --     Chemistries  Recent Labs  Lab 01/02/18 2153 01/03/18 0453  NA 125* 129*  K 5.4* 4.4  CL 86* 92*  CO2 30 29  GLUCOSE 472* 360*  BUN 33* 34*  CREATININE 2.02* 1.84*  CALCIUM 8.5* 8.2*  AST 62*  --   ALT 26  --   ALKPHOS 60  --   BILITOT 1.1  --    ------------------------------------------------------------------------------------------------------------------ estimated creatinine clearance is 52.4 mL/min (A) (by C-G formula based on SCr of 1.84 mg/dL  (H)). ------------------------------------------------------------------------------------------------------------------ No results for input(s): HGBA1C in the last 72 hours. ------------------------------------------------------------------------------------------------------------------ No results for input(s): CHOL, HDL, LDLCALC, TRIG, CHOLHDL, LDLDIRECT in the last 72 hours. ------------------------------------------------------------------------------------------------------------------ No results for input(s): TSH, T4TOTAL, T3FREE, THYROIDAB in the last 72 hours.  Invalid input(s): FREET3 ------------------------------------------------------------------------------------------------------------------ No results for input(s): VITAMINB12, FOLATE, FERRITIN, TIBC, IRON, RETICCTPCT in the last 72 hours.  Coagulation profile Recent Labs  Lab 01/02/18 2153  INR 1.21    No results for input(s): DDIMER in the last 72 hours.  Cardiac Enzymes Recent Labs  Lab 01/03/18 0137 01/03/18 0453 01/03/18 1026  TROPONINI 3.52* 3.09* 1.95*   ------------------------------------------------------------------------------------------------------------------ Invalid input(s): POCBNP    Assessment & Plan  Patient's 57 year old with left foot wound recent admission for sepsis related to pneumonia and foot infection   #1  Sepsis ([place]) due to foot infection continue broad-spectrum antibiotics Appreciate podiatry input   #2 Coronary artery disease -with possible non-ST MI felt to be due to stress related to his sepsis patient has three-vessel disease and awaiting improvement from his medical conditions prior to CABG  #3  Diabetic foot infection ([place]) -MRI of the foot further debridement per podiatry  #4  AKI (acute kidney injury) ([place]) -improved continue to monitor  #5  Hypertension -hold antihypertensives for now as the patient's blood pressure is borderline low   #6 chronic systolic CHF  (congestive heart failure) ([place]) -discontinue fluid if he develops symptoms  #7  Diabetes ([place]) -continue sliding scale insulin with corresponding glucose checks    #8 hyperlipidemia -Home dose antilipid       Code Status Orders  (From admission, onward)        Start     Ordered   01/03/18 0409  Full code  Continuous     01/03/18 0408    Code Status History    Date Active Date Inactive Code Status Order ID Comments User Context   12/22/2017 0607 12/23/2017 1916 Full Code [id number]  Harrie Foreman, MD Inpatient   11/21/2017 0415 11/23/2017 1444 Full Code [id number]  Lance Coon, MD Inpatient   04/23/2017 0355 04/29/2017 1353 Full Code [id number]  Saundra Shelling, MD Inpatient   07/11/2016 0324 07/11/2016 1851 Full Code [id number]  Saundra Shelling, MD Inpatient   05/06/2016 0005 05/07/2016 1417 Full Code [id number]  Harvie Bridge, DO Inpatient   11/21/2015 1716 11/22/2015 1950 Full Code [id number]  Gladstone Lighter, MD Inpatient   07/06/2015 0948 07/06/2015 1437 Full Code [id number]  Algernon Huxley, MD Inpatient           Consults podiatry, cardiology   DVT Prophylaxis  Lovenox   Lab Results  Component Value Date   PLT 278 01/03/2018     Time Spent in minutes 27min Greater than 50% of time spent in care coordination and counseling patient regarding the condition and plan of care.   Dustin Flock M.D on 01/03/2018 at 3:11 PM  Between 7am to 6pm - Pager - [phone]  After 6pm go to [email or web address] - Proofreader  [employer]   Office  [phone]

## 2018-01-03 NOTE — Consult Note (Signed)
ORTHOPAEDIC CONSULTATION  REQUESTING PHYSICIAN: Dustin Flock, MD  Chief Complaint: Right foot infection  HPI: [name] [name] is a 57 y.o. male who complains of worsening right foot infection.  Has had a long-standing ulcer on the plantar aspect of this right foot.  Family noticed worsening confusion and pain to his right lower extremity.  Noticed redness swelling and drainage.  Admitted to the hospital.  X-ray showed gas in the soft tissue.  Elevated white blood cell count.  Code sepsis has been called.  Past Medical History:  Diagnosis Date  . Arthritis   . Asthma   . Atherosclerosis   . BPH (benign prostatic hyperplasia)   . Carotid arterial disease ([place])   . Charcot's joint of foot, right   . Chronic combined systolic (congestive) and diastolic (congestive) heart failure ([place])    a. 02/2013 EF 50% by LV gram; b. 04/2017 Echo: EF 25-30%. diff HK. Gr2 DD. Mod dil LA/RV. PASP 88mmHg.  [name] COPD (chronic obstructive pulmonary disease) ([place])   . Coronary artery disease    a. 2013 S/P PCI of LAD [facility]);  b. 03/2013 PCI: RCA 90p ( 2.5 x 23 mm DES); c. 02/2014 Cath: patent RCA stent->Med Rx; d. 04/2017 Cath: LM nl, LAd 30ost/p, 67m/d, D1 80ost, LCX 90p/m, OM1 85, RCA 70/20p, 52m, 70d->Referred for CT Surg-felt to be poor candidate.  . Diabetic neuropathy ([place])   . Diabetic ulcer of right foot ([place])   . Hernia   . Hyperlipidemia   . Hypertension   . Ischemic cardiomyopathy    a. 04/2017 Echo: EF 25-30%; b. 08/2017 Cardiac MRI ([facility]): EF 19%, sev glob HK. RVEF 20%, mod BAE, triv MR, mild-mod TR. Basal lateral subendocardial infarct (viable), inf/infsept, dist septal ischemia, basal to mid lat peri-infarct ischemia.  . Morbid obesity ([place])   . Neuropathy   . PAD (peripheral artery disease) ([place])    a. Followed by Dr. Lucky Cowboy; b. 08/2010 Periph Angio: RSFA 70-80p (6X60 self-expanding stent); c. 09/2010 Periph Angio: L SFA 100p (7x unknown length self-expanding stent); d. 06/2015 Periph Angio: R SFA  short segment occlusion (6x12 self-expanding stent).  . Restless leg syndrome   . Sleep apnea   . Subclavian artery stenosis, right ([place])   . Syncope and collapse   . Tobacco use    a. 75+ yr hx - still smoking 1ppd, down from 2 ppd.  . Type II diabetes mellitus ([place])   . Varicose vein    Past Surgical History:  Procedure Laterality Date  . ABDOMINAL AORTAGRAM N/A 06/23/2013   Procedure: ABDOMINAL Maxcine Ham;  Surgeon: Wellington Hampshire, MD;  Location: [facility];  Service: Cardiovascular;  Laterality: N/A;  . APPENDECTOMY    . CARDIAC CATHETERIZATION  10/14   [facility]; X1 STENT PROXIMAL RCA  . CARDIAC CATHETERIZATION  02/2010   [facility]  . CARDIAC CATHETERIZATION  02/21/2014   [facility]  . CLAVICLE SURGERY    . HERNIA REPAIR    . LEFT HEART CATH AND CORS/GRAFTS ANGIOGRAPHY N/A 04/28/2017   Procedure: LEFT HEART CATH AND CORONARY ANGIOGRAPHY;  Surgeon: Wellington Hampshire, MD;  Location: [facility];  Service: Cardiovascular;  Laterality: N/A;  . PERIPHERAL ARTERIAL STENT GRAFT     x2 left/right  . PERIPHERAL VASCULAR CATHETERIZATION N/A 07/06/2015   Procedure: Abdominal Aortogram w/Lower Extremity;  Surgeon: Algernon Huxley, MD;  Location: [facility];  Service: Cardiovascular;  Laterality: N/A;  . PERIPHERAL VASCULAR CATHETERIZATION  07/06/2015   Procedure: Lower Extremity Intervention;  Surgeon: Algernon Huxley, MD;  Location: [facility];  Service: Cardiovascular;;   Social History   Socioeconomic History  . Marital status: Single    Spouse name: Not on file  . Number of children: 0  . Years of education: 45  . Highest education level: 12th grade  Occupational History  . Occupation: on disability  Social Needs  . Financial resource strain: Somewhat hard  . Food insecurity:    Worry: Sometimes true    Inability: Sometimes true  . Transportation needs:    Medical: No    Non-medical: No  Tobacco Use  . Smoking status: Current Every Day Smoker    Packs/day: 1.00     Years: 41.00    Pack years: 41.00    Types: Cigarettes  . Smokeless tobacco: Never Used  Substance and Sexual Activity  . Alcohol use: No  . Drug use: No  . Sexual activity: Never  Lifestyle  . Physical activity:    Days per week: 7 days    Minutes per session: 30 min  . Stress: Very much  Relationships  . Social connections:    Talks on phone: More than three times a week    Gets together: Once a week    Attends religious service: Never    Active member of club or organization: No    Attends meetings of clubs or organizations: Never    Relationship status: Living with partner  Other Topics Concern  . Not on file  Social History Narrative   Lives at home in [place] with family. Independent at baseline.   Family History  Problem Relation Age of Onset  . Heart attack Father   . Heart disease Father   . Hypertension Father   . Hyperlipidemia Father   . Hypertension Mother   . Hyperlipidemia Mother    Allergies  Allergen Reactions  . Dulaglutide Anaphylaxis, Diarrhea and Hives  . Prednisone Rash   Prior to Admission medications   Medication Sig Start Date End Date Taking? Authorizing Provider  albuterol (PROVENTIL HFA;VENTOLIN HFA) 108 (90 BASE) MCG/ACT inhaler Inhale 2 puffs into the lungs every 6 (six) hours as needed for wheezing or shortness of breath. 04/15/13  Yes Wellington Hampshire, MD  aspirin EC 81 MG tablet Take 1 tablet (81 mg total) by mouth daily. 05/11/15  Yes Wellington Hampshire, MD  atorvastatin (LIPITOR) 80 MG tablet Take 80 mg by mouth daily.   Yes [provider]  carvedilol (COREG) 6.25 MG tablet Take 1 tablet by mouth 2 (two) times daily. 09/10/17 09/10/18 Yes [provider]  clopidogrel (PLAVIX) 75 MG tablet Take 1 tablet by mouth daily. 08/22/17  Yes [provider]  doxycycline (VIBRAMYCIN) 100 MG capsule Take 1 capsule (100 mg total) by mouth 2 (two) times daily for 14 days. 12/23/17 01/06/18 Yes Gladstone Lighter, MD  furosemide  (LASIX) 40 MG tablet Take 1 tablet (40 mg total) 2 (two) times daily by mouth. Patient taking differently: Take 40 mg by mouth daily as needed for fluid.  04/29/17 04/29/18 Yes Max Sane, MD  gabapentin (NEURONTIN) 800 MG tablet Take 1 tablet by mouth 4 (four) times daily. 08/14/17 08/14/18 Yes [provider]  Insulin Glargine (LANTUS SOLOSTAR) 100 UNIT/ML Solostar Pen Inject 70 Units into the skin at bedtime. Patient taking differently: Inject 40 Units into the skin at bedtime.  12/23/17  Yes Gladstone Lighter, MD  insulin lispro (HUMALOG) 100 UNIT/ML injection Inject 0-10 Units into the skin  3 (three) times daily with meals. If BS is 0 to 70 no insulin and eat/glucose tablets/glucose get If BS 71 to 150 great! If BS 151 to 200, 2 Units Humaog If BS 201 to 250, 4 Units Humalog If BS 251 to 300, 6 Units Humalog If BS 301 to 350, 8 Units Humalog If BS 351 to 400, 10 Units Humalog If BS greater than 400, take 10 Units and Contact our office during office hour   Yes [provider]  ipratropium-albuterol (DUONEB) 0.5-2.5 (3) MG/3ML SOLN Inhale 3 mLs into the lungs every 6 (six) hours as needed (wheezing/sob).    Yes [provider]  lisinopril (PRINIVIL,ZESTRIL) 5 MG tablet Take 1 tablet by mouth daily. 09/10/17 09/10/18 Yes [provider]  metFORMIN (GLUCOPHAGE-XR) 500 MG 24 hr tablet Take 500 mg by mouth daily with breakfast.   Yes [provider]  nitroGLYCERIN (NITRODUR - DOSED IN MG/24 HR) 0.4 mg/hr patch Place 1 patch onto the skin daily.  08/14/17  Yes [provider]  nitroGLYCERIN (NITROSTAT) 0.4 MG SL tablet Place 0.4 mg under the tongue every 5 (five) minutes as needed for chest pain.   Yes [provider]  oxyCODONE (OXY IR/ROXICODONE) 5 MG immediate release tablet Take 5-10 mg by mouth every 6 (six) hours as needed for severe pain.    Yes [provider]  traZODone (DESYREL) 50 MG tablet Take 1 tablet by mouth at bedtime.  08/12/17  Yes [provider]  nicotine (NICODERM CQ - DOSED IN MG/24 HOURS) 14 mg/24hr patch Place 1 patch (14 mg total) onto the skin daily. Patient not taking: Reported on 01/02/2018 11/23/17   Salary, Avel Peace, MD  predniSONE (STERAPRED UNI-PAK 21 TAB) 10 MG (21) TBPK tablet 6 tabs PO x 1 day 5 tabs PO x 1 day 4 tabs PO x 1 day 3 tabs PO x 1 day 2 tabs PO x 1 day 1 tab PO x 1 day and stop Patient not taking: Reported on 01/02/2018 12/23/17   Gladstone Lighter, MD   Dg Chest 2 View  Result Date: 01/02/2018 CLINICAL DATA:  Weakness and tremors. Gait abnormalities and memory issues. Sepsis. EXAM: CHEST - 2 VIEW COMPARISON:  12/21/2017 FINDINGS: Mild cardiac enlargement. No vascular congestion, edema, or consolidation. No blunting of costophrenic angles. No pneumothorax. Calcification of the aorta. Mediastinal contours appear intact. Degenerative changes in the spine and shoulders. Old resection or resorption of the distal right clavicle. IMPRESSION: Mild cardiac enlargement. No evidence of active pulmonary disease. Aortic atherosclerosis. Electronically Signed   By: Lucienne Capers M.D.   On: 01/02/2018 21:51   Dg Foot Complete Right  Result Date: 01/02/2018 CLINICAL DATA:  Diabetic ulcer at the base of the fifth metatarsal bone. EXAM: RIGHT FOOT COMPLETE - 3+ VIEW COMPARISON:  Right foot radiograph 12/22/2017. MRI right foot 12/23/2017 FINDINGS: Large soft tissue ulceration over the plantar surface of the lateral right foot at the level of the fifth metatarsal base. This was present on the previous study. In the interval, there is development of soft tissue gas along the dorsum of the right foot. This likely reflects progression of infection with gas-forming organism. No definite radiographic changes to suggest osteomyelitis, but changes of osteomyelitis were demonstrated in the fourth and fifth metatarsal bones on the previous MRI. Degenerative changes in the interphalangeal and intertarsal  joints. Plantar calcaneal spur. No radiopaque soft tissue foreign bodies. IMPRESSION: Large soft tissue ulceration over the plantar surface of the right foot at the  level of the fifth metatarsal base as seen previously. New soft tissue gas along the dorsum of the right foot likely representing progression of infection with gas-forming organism. No radiographic evidence of osteomyelitis, but previous MRI showed evidence of osteomyelitis in the fourth and fifth metatarsal bones. Electronically Signed   By: Lucienne Capers M.D.   On: 01/02/2018 22:46    Positive ROS: All other systems have been reviewed and were otherwise negative with the exception of those mentioned in the HPI and as above.  12 point ROS was performed.  Physical Exam: General: Alert and oriented.  No apparent distress.  Vascular:  Left foot:Dorsalis Pedis:  absent Posterior Tibial:  absent.  Pulses were audible with Doppler  Right foot: Absent DP and PT pulses.  Monophasic posterior tibial pulse and barely monophasic dorsalis pedis pulse.   Cap fill time is brisk to all digits  Neuro:absent protective sensation  Derm: Left foot without ulceration.  Plantar right foot with large ulceration at the base of the fifth metatarsal.  Obvious exposed bone that is palpable at this time.  Noted purulent drainage from the wound.  This wound probes deep into the plantar aspect of the foot.  Diffuse erythema to the right lower extremity is noted.  Ortho/MS: Diffuse edema to the right foot.  Skin crepitance to the dorsal aspect of the midtarsal joint.  Though he is neuropathic he is complaining of pain to the right lower extremity.  Assessment: Gas producing infection right foot. Osteomyelitis right foot Diabetes with neuropathy  Plan: We will repeat MRI to further evaluate the extent of the osteomyelitis and as well as the gas in the soft tissue.  Suspect gas has tracked towards the ankle at this time.  Also suspect further erosive  changes of the fifth metatarsal and will need to evaluate the extent of bone infection.  We will plan for I&D with debridement of obvious infection tomorrow.  Patient will need further work-up with vascular surgery as well.  We will go ahead and drain infection prior to vascular work-up due to the gas producing bacteria and sepsis that has occurred.  Had a long discussion with the patient in regards to surgical intervention as well as the risk benefits alternatives and complications associated with surgery.  Consent has been given.  Patient understands he is at a very high risk of undergoing amputation to the right lower extremity if this progresses.    Elesa Hacker, DPM Cell [phone]   01/03/2018 9:36 AM

## 2018-01-03 NOTE — Plan of Care (Signed)
  Problem: Clinical Measurements: Goal: Will remain free from infection Outcome: Progressing   Problem: Pain Managment: Goal: General experience of comfort will improve Outcome: Progressing   Problem: Skin Integrity: Goal: Risk for impaired skin integrity will decrease Outcome: Progressing   Problem: Clinical Measurements: Goal: Diagnostic test results will improve Outcome: Progressing

## 2018-01-03 NOTE — Progress Notes (Signed)
RT Note: Patient was seen during routine tx rounds for svn. Found patient in respiratory distress at that time with hardly no bilateral breath sounds. HR 118-124 RR 26 saturation on 2liters 84-90%.  SVN tx  Immediately started. Patient responded well to tx. Better aeration noted. Vitals were checked by RN and Agricultural consultant.  Placed patient on 35% venturi mask briefly due to tachypnea remaining post tx.  Patient stated it was still hard to catch his breath.  He has now been weaned back to 2 liter nasal cannula.  Tolerating well.

## 2018-01-03 NOTE — Anesthesia Preprocedure Evaluation (Addendum)
Anesthesia Evaluation    History of Anesthesia Complications Negative for: history of anesthetic complications  Airway Mallampati: III       Dental  (+) Dental Advidsory Given, Poor Dentition, Edentulous Upper, Edentulous Lower   Pulmonary shortness of breath and with exertion, asthma , sleep apnea , COPD,  COPD inhaler, neg recent URI, Current Smoker,           Cardiovascular Exercise Tolerance: Poor hypertension, + angina + CAD, + Past MI, + Cardiac Stents, + Peripheral Vascular Disease and +CHF  (-) CABG and (-) DVT (-) dysrhythmias (-) Valvular Problems/Murmurs     Neuro/Psych negative neurological ROS  negative psych ROS   GI/Hepatic Neg liver ROS, GERD  ,  Endo/Other  diabetes  Renal/GU Renal disease     Musculoskeletal   Abdominal   Peds  Hematology   Anesthesia Other Findings Past Medical History: No date: Arthritis No date: Asthma No date: Atherosclerosis No date: BPH (benign prostatic hyperplasia) No date: Carotid arterial disease ([place]) No date: Charcot's joint of foot, right No date: Chronic combined systolic (congestive) and diastolic  (congestive) heart failure ([place])     Comment:  a. 02/2013 EF 50% by LV gram; b. 04/2017 Echo: EF 25-30%.              diff HK. Gr2 DD. Mod dil LA/RV. PASP 23mmHg. No date: COPD (chronic obstructive pulmonary disease) ([facility]) No date: Coronary artery disease     Comment:  a. 2013 S/P PCI of LAD ([facility]);  b. 03/2013 PCI: RCA 90p (              2.5 x 23 mm DES); c. 02/2014 Cath: patent RCA stent->Med               Rx; d. 04/2017 Cath: LM nl, LAd 30ost/p, 18m/d, D1 80ost,              LCX 90p/m, OM1 85, RCA 70/20p, 27m, 70d->Referred for CT               Surg-felt to be poor candidate. No date: Diabetic neuropathy ([place]) No date: Diabetic ulcer of right foot ([place]) No date: Hernia No date: Hyperlipidemia No date: Hypertension No date: Ischemic cardiomyopathy     Comment:  a.  04/2017 Echo: EF 25-30%; b. 08/2017 Cardiac MRI               ([facility]): EF 19%, sev glob HK. RVEF 20%, mod BAE, triv MR,               mild-mod TR. Basal lateral subendocardial infarct               (viable), inf/infsept, dist septal ischemia, basal to mid              lat peri-infarct ischemia. No date: Morbid obesity ([place]) No date: Neuropathy No date: PAD (peripheral artery disease) ([facility])     Comment:  a. Followed by Dr. Lucky Cowboy; b. 08/2010 Periph Angio: RSFA               70-80p (6X60 self-expanding stent); c. 09/2010 Periph               Angio: L SFA 100p (7x unknown length self-expanding               stent); d. 06/2015 Periph Angio: R SFA short segment               occlusion (6x12 self-expanding stent). No date:  Restless leg syndrome No date: Sleep apnea No date: Subclavian artery stenosis, right ([facility]) No date: Syncope and collapse No date: Tobacco use     Comment:  a. 75+ yr hx - still smoking 1ppd, down from 2 ppd. No date: Type II diabetes mellitus ([facility]) No date: Varicose vein   Reproductive/Obstetrics negative OB ROS                            Anesthesia Physical Anesthesia Plan  ASA: IV  Anesthesia Plan: General   Post-op Pain Management:    Induction: Intravenous  PONV Risk Score and Plan: 1 and Propofol infusion  Airway Management Planned: Simple Face Mask  Additional Equipment:   Intra-op Plan:   Post-operative Plan:   Informed Consent:   Plan Discussed with: CRNA and Anesthesiologist  Anesthesia Plan Comments: (Patient with new signs of volume overload/respiratory symptoms as of 7/27 afternoon.  We would like this optimized before his procedure if possible.  This was communicated with OR charge nurse and his floor nurse.)      Anesthesia Quick Evaluation

## 2018-01-04 ENCOUNTER — Encounter: Payer: Self-pay | Admitting: Anesthesiology

## 2018-01-04 ENCOUNTER — Inpatient Hospital Stay: Payer: Medicaid Other | Admitting: Anesthesiology

## 2018-01-04 ENCOUNTER — Encounter
Admission: EM | Disposition: A | Discharge status: Home Health | Payer: Self-pay | Source: Home / Self Care | Attending: Internal Medicine

## 2018-01-04 DIAGNOSIS — F17210 Nicotine dependence, cigarettes, uncomplicated: Secondary | ICD-10-CM

## 2018-01-04 DIAGNOSIS — Z888 Allergy status to other drugs, medicaments and biological substances status: Secondary | ICD-10-CM

## 2018-01-04 DIAGNOSIS — E1151 Type 2 diabetes mellitus with diabetic peripheral angiopathy without gangrene: Secondary | ICD-10-CM

## 2018-01-04 DIAGNOSIS — L089 Local infection of the skin and subcutaneous tissue, unspecified: Secondary | ICD-10-CM

## 2018-01-04 DIAGNOSIS — E11621 Type 2 diabetes mellitus with foot ulcer: Secondary | ICD-10-CM

## 2018-01-04 DIAGNOSIS — I2510 Atherosclerotic heart disease of native coronary artery without angina pectoris: Secondary | ICD-10-CM

## 2018-01-04 HISTORY — PX: IRRIGATION AND DEBRIDEMENT FOOT: SHX6602

## 2018-01-04 LAB — LACTIC ACID, PLASMA: Lactic Acid, Venous: 1.2 mmol/L (ref 0.5–1.9)

## 2018-01-04 LAB — CBC
HCT: 26 % — ABNORMAL LOW (ref 40.0–52.0)
Hemoglobin: 8.8 g/dL — ABNORMAL LOW (ref 13.0–18.0)
MCH: 28 pg (ref 26.0–34.0)
MCHC: 33.6 g/dL (ref 32.0–36.0)
MCV: 83.3 fL (ref 80.0–100.0)
Platelets: 271 10*3/uL (ref 150–440)
RBC: 3.13 MIL/uL — ABNORMAL LOW (ref 4.40–5.90)
RDW: 18 % — ABNORMAL HIGH (ref 11.5–14.5)
WBC: 17.6 10*3/uL — ABNORMAL HIGH (ref 3.8–10.6)

## 2018-01-04 LAB — BASIC METABOLIC PANEL
Anion gap: 7 (ref 5–15)
BUN: 39 mg/dL — ABNORMAL HIGH (ref 6–20)
CO2: 27 mmol/L (ref 22–32)
Calcium: 8.5 mg/dL — ABNORMAL LOW (ref 8.9–10.3)
Chloride: 93 mmol/L — ABNORMAL LOW (ref 98–111)
Creatinine, Ser: 1.48 mg/dL — ABNORMAL HIGH (ref 0.61–1.24)
GFR calc Af Amer: 60 mL/min — ABNORMAL LOW (ref >60)
GFR calc non Af Amer: 52 mL/min — ABNORMAL LOW (ref >60)
Glucose, Bld: 364 mg/dL — ABNORMAL HIGH (ref 70–99)
Potassium: 5.2 mmol/L — ABNORMAL HIGH (ref 3.5–5.1)
Sodium: 127 mmol/L — ABNORMAL LOW (ref 135–145)

## 2018-01-04 LAB — GLUCOSE, CAPILLARY
Glucose-Capillary: 352 mg/dL — ABNORMAL HIGH (ref 70–99)
Glucose-Capillary: 371 mg/dL — ABNORMAL HIGH (ref 70–99)
Glucose-Capillary: 409 mg/dL — ABNORMAL HIGH (ref 70–99)
Glucose-Capillary: 413 mg/dL — ABNORMAL HIGH (ref 70–99)
Glucose-Capillary: 479 mg/dL — ABNORMAL HIGH (ref 70–99)
Glucose-Capillary: 503 mg/dL (ref 70–99)

## 2018-01-04 LAB — HEPARIN LEVEL (UNFRACTIONATED)
Heparin Unfractionated: <0.1 IU/mL — ABNORMAL LOW (ref 0.30–0.70)
Heparin Unfractionated: 0.16 IU/mL — ABNORMAL LOW (ref 0.30–0.70)
Heparin Unfractionated: <0.1 IU/mL — ABNORMAL LOW (ref 0.30–0.70)

## 2018-01-04 LAB — TROPONIN I: Troponin I: 2.17 ng/mL (ref <0.03)

## 2018-01-04 SURGERY — IRRIGATION AND DEBRIDEMENT FOOT
Anesthesia: General | Laterality: Right

## 2018-01-04 MED ORDER — BUPIVACAINE HCL (PF) 0.5 % IJ SOLN
INTRAMUSCULAR | Status: AC
  Filled 2018-01-04: qty 30

## 2018-01-04 MED ORDER — GABAPENTIN 400 MG PO CAPS
400.0000 mg | ORAL_CAPSULE | Freq: Two times a day (BID) | ORAL | Status: DC
Start: 2018-01-04 — End: 2018-01-09
  Administered 2018-01-04 – 2018-01-09 (×10): 400 mg via ORAL
  Filled 2018-01-04 (×10): qty 1

## 2018-01-04 MED ORDER — EPINEPHRINE PF 1 MG/ML IJ SOLN
INTRAMUSCULAR | Status: AC
  Filled 2018-01-04: qty 1

## 2018-01-04 MED ORDER — INSULIN ASPART 100 UNIT/ML ~~LOC~~ SOLN
15.0000 [IU] | Freq: Once | SUBCUTANEOUS | Status: AC
  Administered 2018-01-04: 15 [IU] via SUBCUTANEOUS

## 2018-01-04 MED ORDER — FUROSEMIDE 10 MG/ML IJ SOLN
80.0000 mg | Freq: Once | INTRAMUSCULAR | Status: AC
  Administered 2018-01-04: 80 mg via INTRAVENOUS
  Filled 2018-01-04: qty 8

## 2018-01-04 MED ORDER — INSULIN REGULAR HUMAN 100 UNIT/ML IJ SOLN
20.0000 [IU] | Freq: Once | INTRAMUSCULAR | Status: AC
  Administered 2018-01-05: 20 [IU] via INTRAVENOUS
  Filled 2018-01-04: qty 0.2

## 2018-01-04 MED ORDER — PROPOFOL 10 MG/ML IV BOLUS
INTRAVENOUS | Status: DC | PRN
  Administered 2018-01-04: 40 mg via INTRAVENOUS

## 2018-01-04 MED ORDER — VANCOMYCIN HCL IN DEXTROSE 1-5 GM/200ML-% IV SOLN
1000.0000 mg | Freq: Two times a day (BID) | INTRAVENOUS | Status: DC
  Administered 2018-01-04 – 2018-01-05 (×2): 1000 mg via INTRAVENOUS
  Filled 2018-01-04 (×4): qty 200

## 2018-01-04 MED ORDER — ONDANSETRON HCL 4 MG/2ML IJ SOLN: 4.0000 mg | Freq: Once | INTRAMUSCULAR | Status: DC | PRN

## 2018-01-04 MED ORDER — PROPOFOL 500 MG/50ML IV EMUL
INTRAVENOUS | Status: DC | PRN
  Administered 2018-01-04: 125 ug/kg/min via INTRAVENOUS

## 2018-01-04 MED ORDER — ALPRAZOLAM 0.5 MG PO TABS
0.5000 mg | ORAL_TABLET | Freq: Four times a day (QID) | ORAL | Status: DC | PRN
  Administered 2018-01-04 – 2018-01-08 (×9): 0.5 mg via ORAL
  Filled 2018-01-04 (×9): qty 1

## 2018-01-04 MED ORDER — INSULIN ASPART 100 UNIT/ML ~~LOC~~ SOLN
5.0000 [IU] | Freq: Once | SUBCUTANEOUS | Status: AC
  Administered 2018-01-04: 5 [IU] via INTRAVENOUS

## 2018-01-04 MED ORDER — INSULIN ASPART 100 UNIT/ML ~~LOC~~ SOLN
0.0000 [IU] | Freq: Three times a day (TID) | SUBCUTANEOUS | Status: DC
  Administered 2018-01-05: 20 [IU] via SUBCUTANEOUS
  Administered 2018-01-05: 6 [IU] via SUBCUTANEOUS
  Administered 2018-01-05: 20 [IU] via SUBCUTANEOUS
  Administered 2018-01-05: 6 [IU] via SUBCUTANEOUS
  Administered 2018-01-06 (×2): 11 [IU] via SUBCUTANEOUS
  Administered 2018-01-07: 4 [IU] via SUBCUTANEOUS
  Administered 2018-01-07 (×2): 3 [IU] via SUBCUTANEOUS
  Administered 2018-01-07: 4 [IU] via SUBCUTANEOUS
  Administered 2018-01-08: 3 [IU] via SUBCUTANEOUS
  Administered 2018-01-08: 4 [IU] via SUBCUTANEOUS
  Administered 2018-01-08 (×2): 3 [IU] via SUBCUTANEOUS
  Filled 2018-01-04 (×12): qty 1

## 2018-01-04 MED ORDER — PHENYLEPHRINE HCL 10 MG/ML IJ SOLN
INTRAMUSCULAR | Status: DC | PRN
  Administered 2018-01-04: 100 ug via INTRAVENOUS

## 2018-01-04 MED ORDER — VANCOMYCIN HCL 1000 MG IV SOLR
INTRAVENOUS | Status: AC
  Filled 2018-01-04: qty 1000

## 2018-01-04 MED ORDER — FENTANYL CITRATE (PF) 100 MCG/2ML IJ SOLN
25.0000 ug | INTRAMUSCULAR | Status: DC | PRN
  Administered 2018-01-04 (×2): 50 ug via INTRAVENOUS

## 2018-01-04 MED ORDER — INSULIN REGULAR HUMAN 100 UNIT/ML IJ SOLN
20.0000 [IU] | Freq: Once | INTRAMUSCULAR | Status: AC
  Administered 2018-01-04: 20 [IU] via INTRAVENOUS
  Filled 2018-01-04: qty 0.2

## 2018-01-04 MED ORDER — SODIUM CHLORIDE 0.9 % IV SOLN
2.0000 g | Freq: Three times a day (TID) | INTRAVENOUS | Status: DC
  Administered 2018-01-04 – 2018-01-05 (×2): 2 g via INTRAVENOUS
  Filled 2018-01-04 (×4): qty 2

## 2018-01-04 MED ORDER — INSULIN GLARGINE 100 UNIT/ML ~~LOC~~ SOLN
60.0000 [IU] | Freq: Every day | SUBCUTANEOUS | Status: DC
  Administered 2018-01-04 – 2018-01-08 (×5): 60 [IU] via SUBCUTANEOUS
  Filled 2018-01-04 (×6): qty 0.6

## 2018-01-04 MED ORDER — NICOTINE 21 MG/24HR TD PT24
21.0000 mg | MEDICATED_PATCH | Freq: Every day | TRANSDERMAL | Status: DC
  Administered 2018-01-04 – 2018-01-09 (×6): 21 mg via TRANSDERMAL
  Filled 2018-01-04 (×6): qty 1

## 2018-01-04 MED ORDER — HEPARIN BOLUS VIA INFUSION
2700.0000 [IU] | Freq: Once | INTRAVENOUS | Status: AC
  Administered 2018-01-04: 2700 [IU] via INTRAVENOUS
  Filled 2018-01-04: qty 2700

## 2018-01-04 MED ORDER — INSULIN ASPART 100 UNIT/ML ~~LOC~~ SOLN
20.0000 [IU] | Freq: Once | SUBCUTANEOUS | Status: AC
  Administered 2018-01-04: 20 [IU] via SUBCUTANEOUS
  Filled 2018-01-04: qty 1

## 2018-01-04 MED ORDER — PROPOFOL 10 MG/ML IV BOLUS
INTRAVENOUS | Status: AC
  Filled 2018-01-04: qty 20

## 2018-01-04 MED ORDER — LACTATED RINGERS IV SOLN
INTRAVENOUS | Status: DC | PRN
  Administered 2018-01-04: 08:00:00 via INTRAVENOUS

## 2018-01-04 MED ORDER — FENTANYL CITRATE (PF) 100 MCG/2ML IJ SOLN
INTRAMUSCULAR | Status: AC
  Filled 2018-01-04: qty 2

## 2018-01-04 MED ORDER — LIDOCAINE HCL (PF) 1 % IJ SOLN
INTRAMUSCULAR | Status: AC
  Filled 2018-01-04: qty 60

## 2018-01-04 MED ORDER — LIDOCAINE-EPINEPHRINE 1 %-1:100000 IJ SOLN
INTRAMUSCULAR | Status: DC | PRN
  Administered 2018-01-04: 30 mL

## 2018-01-04 MED ORDER — PROPOFOL 10 MG/ML IV BOLUS
INTRAVENOUS | Status: AC
  Filled 2018-01-04: qty 60

## 2018-01-04 SURGICAL SUPPLY — 65 items
BANDAGE ACE 4X5 VEL STRL LF (GAUZE/BANDAGES/DRESSINGS) ×2 IMPLANT
BLADE OSC/SAGITTAL MD 5.5X18 (BLADE) IMPLANT
BLADE OSCILLATING/SAGITTAL (BLADE)
BLADE SW THK.38XMED LNG THN (BLADE) IMPLANT
BNDG COHESIVE 4X5 TAN STRL (GAUZE/BANDAGES/DRESSINGS) ×2 IMPLANT
BNDG COHESIVE 6X5 TAN STRL LF (GAUZE/BANDAGES/DRESSINGS) ×2 IMPLANT
BNDG CONFORM 3 STRL LF (GAUZE/BANDAGES/DRESSINGS) ×2 IMPLANT
BNDG ESMARK 4X12 TAN STRL LF (GAUZE/BANDAGES/DRESSINGS) ×2 IMPLANT
BNDG GAUZE 4.5X4.1 6PLY STRL (MISCELLANEOUS) ×2 IMPLANT
CANISTER SUCT 1200ML W/VALVE (MISCELLANEOUS) ×2 IMPLANT
CANISTER SUCT 3000ML PPV (MISCELLANEOUS) ×2 IMPLANT
CUFF TOURN 18 STER (MISCELLANEOUS) ×2 IMPLANT
CUFF TOURN DUAL PL 12 NO SLV (MISCELLANEOUS) IMPLANT
DRAPE FLUOR MINI C-ARM 54X84 (DRAPES) IMPLANT
DRAPE XRAY CASSETTE 23X24 (DRAPES) IMPLANT
DRESSING ALLEVYN 4X4 (MISCELLANEOUS) IMPLANT
DRSG MEPITEL 4X7.2 (GAUZE/BANDAGES/DRESSINGS) ×2 IMPLANT
DURAPREP 26ML APPLICATOR (WOUND CARE) ×2 IMPLANT
ELECT REM PT RETURN 9FT ADLT (ELECTROSURGICAL) ×2
ELECTRODE REM PT RTRN 9FT ADLT (ELECTROSURGICAL) ×1 IMPLANT
GAUZE PACKING 1/4 X5 YD (GAUZE/BANDAGES/DRESSINGS) ×2 IMPLANT
GAUZE PACKING IODOFORM 1X5 (MISCELLANEOUS) ×2 IMPLANT
GAUZE PETRO XEROFOAM 1X8 (MISCELLANEOUS) ×2 IMPLANT
GAUZE SPONGE 4X4 12PLY STRL (GAUZE/BANDAGES/DRESSINGS) ×2 IMPLANT
GAUZE STRETCH 2X75IN STRL (MISCELLANEOUS) ×2 IMPLANT
GLOVE BIO SURGEON STRL SZ7.5 (GLOVE) ×2 IMPLANT
GLOVE INDICATOR 8.0 STRL GRN (GLOVE) ×2 IMPLANT
GOWN STRL REUS W/ TWL LRG LVL3 (GOWN DISPOSABLE) ×2 IMPLANT
GOWN STRL REUS W/TWL LRG LVL3 (GOWN DISPOSABLE) ×2
GOWN STRL REUS W/TWL MED LVL3 (GOWN DISPOSABLE) ×4 IMPLANT
HANDPIECE VERSAJET DEBRIDEMENT (MISCELLANEOUS) IMPLANT
IV NS 1000ML (IV SOLUTION) ×1
IV NS 1000ML BAXH (IV SOLUTION) ×1 IMPLANT
KIT DRSG VAC SLVR GRANUFM (MISCELLANEOUS) ×2 IMPLANT
KIT STIMULAN RAPID CURE 5CC (Orthopedic Implant) ×2 IMPLANT
KIT TURNOVER KIT A (KITS) ×2 IMPLANT
LABEL OR SOLS (LABEL) ×2 IMPLANT
NEEDLE FILTER BLUNT 18X 1/2SAF (NEEDLE) ×1
NEEDLE FILTER BLUNT 18X1 1/2 (NEEDLE) ×1 IMPLANT
NEEDLE HYPO 25X1 1.5 SAFETY (NEEDLE) ×2 IMPLANT
NS IRRIG 500ML POUR BTL (IV SOLUTION) ×2 IMPLANT
PACK EXTREMITY ARMC (MISCELLANEOUS) ×2 IMPLANT
PAD ABD DERMACEA PRESS 5X9 (GAUZE/BANDAGES/DRESSINGS) ×2 IMPLANT
PULSAVAC PLUS IRRIG FAN TIP (DISPOSABLE) ×2
RASP SM TEAR CROSS CUT (RASP) IMPLANT
SHIELD FULL FACE ANTIFOG 7M (MISCELLANEOUS) ×2 IMPLANT
SOL .9 NS 3000ML IRR  AL (IV SOLUTION) ×1
SOL .9 NS 3000ML IRR UROMATIC (IV SOLUTION) ×1 IMPLANT
SOL PREP PVP 2OZ (MISCELLANEOUS) ×2
SOLUTION PREP PVP 2OZ (MISCELLANEOUS) ×1 IMPLANT
STAPLER SKIN PROX 35W (STAPLE) ×2 IMPLANT
STOCKINETTE IMPERVIOUS 9X36 MD (GAUZE/BANDAGES/DRESSINGS) ×2 IMPLANT
SUT ETHILON 2 0 FS 18 (SUTURE) ×4 IMPLANT
SUT ETHILON 4-0 (SUTURE) ×1
SUT ETHILON 4-0 FS2 18XMFL BLK (SUTURE) ×1
SUT VIC AB 3-0 SH 27 (SUTURE) ×1
SUT VIC AB 3-0 SH 27X BRD (SUTURE) ×1 IMPLANT
SUT VIC AB 4-0 FS2 27 (SUTURE) ×2 IMPLANT
SUTURE ETHLN 4-0 FS2 18XMF BLK (SUTURE) ×1 IMPLANT
SWAB CULTURE AMIES ANAERIB BLU (MISCELLANEOUS) IMPLANT
SYR 10ML LL (SYRINGE) ×4 IMPLANT
SYR 3ML LL SCALE MARK (SYRINGE) ×2 IMPLANT
TIP FAN IRRIG PULSAVAC PLUS (DISPOSABLE) ×1 IMPLANT
WND VAC CANISTER 500ML (MISCELLANEOUS) ×2 IMPLANT
wound vac canister ×2 IMPLANT

## 2018-01-04 NOTE — Progress Notes (Signed)
Progress Note  Patient Name: GILLERMO POCH Date of Encounter: 01/04/2018  Primary Cardiologist: No primary care provider on file.   Subjective   No chest pain or dyspnea.   Inpatient Medications    Scheduled Meds: . aspirin EC  81 mg Oral Daily  . atorvastatin  80 mg Oral Daily  . carvedilol  6.25 mg Oral BID  . clopidogrel  75 mg Oral Daily  . fentaNYL      . insulin aspart  0-9 Units Subcutaneous TID WC  . insulin glargine  40 Units Subcutaneous QHS  . ipratropium-albuterol  3 mL Nebulization Q6H  . methylPREDNISolone (SOLU-MEDROL) injection  60 mg Intravenous Q6H  . traZODone  50 mg Oral QHS   Continuous Infusions: . ceFEPime (MAXIPIME) IV Stopped (01/03/18 2233)  . heparin 2,250 Units/hr (01/04/18 0804)  . vancomycin Stopped (01/04/18 0051)   PRN Meds: acetaminophen **OR** acetaminophen, diphenhydrAMINE, ondansetron **OR** ondansetron (ZOFRAN) IV, oxyCODONE   Vital Signs    Vitals:   01/04/18 0950 01/04/18 0955 01/04/18 1000 01/04/18 1015  BP:   123/82 124/83  Pulse: 93 92 92 61  Resp: (!) 29 17 20 15   Temp:    98 F (36.7 C)  TempSrc:      SpO2: 96% 93% 98% 100%  Weight:      Height:        Intake/Output Summary (Last 24 hours) at 01/04/2018 1045 Last data filed at 01/04/2018 3149 Gross per 24 hour  Intake 559.8 ml  Output 680 ml  Net -120.2 ml   Filed Weights   01/02/18 2213 01/03/18 0337 01/04/18 0254  Weight: 237 lb (107.5 kg) 231 lb 14.4 oz (105.2 kg) 229 lb 3.2 oz (104 kg)    Telemetry    Sinus , PACs - Personally Reviewed  ECG    Sinus tach, RBBB, non-specific T wave abnormalities - Personally Reviewed  Physical Exam   GEN: No acute distress.   Neck: No JVD Cardiac: RRR, no murmurs, rubs, or gallops.  Respiratory: Rhonci bilaterally with scattered wheezes.  GI: Soft, nontender, non-distended  MS: No LE edema; Bandage on right foot Neuro:  Nonfocal  Psych: Normal affect   Labs    Chemistry Recent Labs  Lab 01/02/18 2153  01/03/18 0453 01/04/18 0031  NA 125* 129* 127*  K 5.4* 4.4 5.2*  CL 86* 92* 93*  CO2 30 29 27   GLUCOSE 472* 360* 364*  BUN 33* 34* 39*  CREATININE 2.02* 1.84* 1.48*  CALCIUM 8.5* 8.2* 8.5*  PROT 7.1  --   --   ALBUMIN 2.6*  --   --   AST 62*  --   --   ALT 26  --   --   ALKPHOS 60  --   --   BILITOT 1.1  --   --   GFRNONAA 35* 40* 52*  GFRAA 41* 46* 60*  ANIONGAP 9 8 7      Hematology Recent Labs  Lab 01/02/18 2153 01/03/18 0453 01/04/18 0031  WBC 20.6* 17.7* 17.6*  RBC 3.19* 3.00* 3.13*  HGB 9.0* 8.7* 8.8*  HCT 26.7* 25.1* 26.0*  MCV 83.9 83.8 83.3  MCH 28.1 28.9 28.0  MCHC 33.5 34.5 33.6  RDW 18.4* 18.0* 18.0*  PLT 290 278 271    Cardiac Enzymes Recent Labs  Lab 01/03/18 0453 01/03/18 1026 01/03/18 1654 01/04/18 0031  TROPONINI 3.09* 1.95* 2.49* 2.17*   No results for input(s): TROPIPOC in the last 168 hours.   BNPNo results for  input(s): BNP, PROBNP in the last 168 hours.   DDimer No results for input(s): DDIMER in the last 168 hours.   Radiology    Dg Chest 2 View  Result Date: 01/02/2018 CLINICAL DATA:  Weakness and tremors. Gait abnormalities and memory issues. Sepsis. EXAM: CHEST - 2 VIEW COMPARISON:  12/21/2017 FINDINGS: Mild cardiac enlargement. No vascular congestion, edema, or consolidation. No blunting of costophrenic angles. No pneumothorax. Calcification of the aorta. Mediastinal contours appear intact. Degenerative changes in the spine and shoulders. Old resection or resorption of the distal right clavicle. IMPRESSION: Mild cardiac enlargement. No evidence of active pulmonary disease. Aortic atherosclerosis. Electronically Signed   By: Lucienne Capers M.D.   On: 01/02/2018 21:51   Mr Foot Right Wo Contrast  Result Date: 01/03/2018 CLINICAL DATA:  Large open wound near the base of the fifth metatarsal. EXAM: MRI OF THE RIGHT FOREFOOT WITHOUT CONTRAST TECHNIQUE: Multiplanar, multisequence MR imaging of the right foot was performed. No  intravenous contrast was administered. COMPARISON:  Radiograph 01/02/2018 FINDINGS: Exam is somewhat limited by patient motion and lack of IV contrast. There is a large open wound on the plantar and lateral aspect of the midfoot at the level of the base of the fifth metatarsal. Diffuse surrounding cellulitis but no discrete drainable soft tissue abscess. Diffuse abnormal T1 and T2 signal intensity in the cuboid, fifth metatarsal and base of the fourth metatarsal. There is also fluid in the cuboid articulation with the fourth and fifth metatarsals. Findings consistent with septic arthritis and osteomyelitis. Mild edema like signal abnormality in the midfoot bony structures more medially likely related to advanced degenerative disease but osteomyelitis can't be totally excluded. The tibiotalar and subtalar joints are maintained. Small joint effusions. Diffuse myositis without definite findings for pyomyositis. IMPRESSION: 1. Large plantar and lateral open wound with associated significant cellulitis and myofasciitis. No discrete drainable soft tissue abscess or pyomyositis. 2. MR findings consistent with septic arthritis at the cuboid articulation with the fourth and fifth metatarsal bases and associated osteomyelitis. 3. Midfoot degenerative changes and edema like signal abnormality, likely degenerative but could not exclude osteomyelitis. Electronically Signed   By: Marijo Sanes M.D.   On: 01/03/2018 13:37   Dg Chest Port 1 View  Result Date: 01/03/2018 CLINICAL DATA:  Acute shortness of breath for 2 days. EXAM: PORTABLE CHEST 1 VIEW COMPARISON:  01/02/2018 FINDINGS: Cardiomegaly and pulmonary vascular congestion again noted. Interstitial prominence is unchanged. There is no evidence of focal airspace disease, pulmonary edema, suspicious pulmonary nodule/mass, pleural effusion, or pneumothorax. No acute bony abnormalities are identified. IMPRESSION: Cardiomegaly with pulmonary vascular congestion. Interstitial  prominence may represent mild interstitial edema. Electronically Signed   By: Margarette Canada M.D.   On: 01/03/2018 15:46   Dg Foot Complete Right  Result Date: 01/02/2018 CLINICAL DATA:  Diabetic ulcer at the base of the fifth metatarsal bone. EXAM: RIGHT FOOT COMPLETE - 3+ VIEW COMPARISON:  Right foot radiograph 12/22/2017. MRI right foot 12/23/2017 FINDINGS: Large soft tissue ulceration over the plantar surface of the lateral right foot at the level of the fifth metatarsal base. This was present on the previous study. In the interval, there is development of soft tissue gas along the dorsum of the right foot. This likely reflects progression of infection with gas-forming organism. No definite radiographic changes to suggest osteomyelitis, but changes of osteomyelitis were demonstrated in the fourth and fifth metatarsal bones on the previous MRI. Degenerative changes in the interphalangeal and intertarsal joints. Plantar calcaneal spur. No radiopaque  soft tissue foreign bodies. IMPRESSION: Large soft tissue ulceration over the plantar surface of the right foot at the level of the fifth metatarsal base as seen previously. New soft tissue gas along the dorsum of the right foot likely representing progression of infection with gas-forming organism. No radiographic evidence of osteomyelitis, but previous MRI showed evidence of osteomyelitis in the fourth and fifth metatarsal bones. Electronically Signed   By: Lucienne Capers M.D.   On: 01/02/2018 22:46    Cardiac Studies     Patient Profile     Mr. Pankow is a 57 yo male with a history of ischemic cardiomyopathy, CAD, chronic systolic CHF, PAD, HTN, COPD, DM and tobacco abuse admitted with possible sepsis. He is followed closely in [facility] but had been followed by [facility] in the past. He saw Dr. Fletcher Anon in November 2018.  He had PCI of his LAD in 2013 and PCI of the RCA in 2014. He has had previous SFA stenting. Most recent cardiac cath November  2018 with severe three vessel CAD with patent stents in the LAD and RCA. There was a severe proximal RCA stenosis as well diffuse mid and distal disease. There was a significant stenosis in the proximal left circumflex and OM1 and moderate LAD disease. The coronaries were noted to be heavily calcified. LV systolic function is known to be severely reduced. He was referred by DR. Arida to see CT surgery but chose to get another opinion at [facility]. Discussion ongoing regarding candidacy for CABG at [facility]. Echo 11/21/17 with LVEF=25-30%. No significant valve disease.   He is now admitted to [facility] on 01/02/18 with confusion, chills, fever and active right wound infection. His family reports worsening of symptoms for several days. He is found to have a right diabetic foot wound with surrounding erythema. His troponin was found to be elevated, with peak of 3.52 and now trending down. He is now on a heparin drip.    Assessment & Plan    1. CAD/Ischemic cardiomyopathy/Elevated troponin: Pt admitted with sepsis from right foot wound. Troponin elevation felt to be due to demand ischemia given extensive CAD and presentation with sepsis. He has no chest pain. Continue ASA, statin and beta blocker.   For questions or updates, please contact [place] Please consult [email or web address] for contact info under Cardiology/STEMI.      Signed, Lauree Chandler, MD  01/04/2018, 10:45 AM

## 2018-01-04 NOTE — Progress Notes (Signed)
ANTICOAGULATION CONSULT NOTE - FOLLOW UP  Pharmacy Consult for heparin drip Indication: chest pain/ACS  Allergies  Allergen Reactions  . Dulaglutide Anaphylaxis, Diarrhea and Hives  . Prednisone Rash    Patient Measurements: Height: 5\' 7"  (170.2 cm) Weight: 231 lb 14.4 oz (105.2 kg) IBW/kg (Calculated) : 66.1 Heparin Dosing Weight: 90 kg  Vital Signs: Temp: 98.8 F (37.1 C) (07/28 0037) Temp Source: Oral (07/28 0037) BP: 128/74 (07/28 0037) Pulse Rate: 106 (07/28 0037)  Labs: Recent Labs    01/02/18 2153  01/03/18 0453 01/03/18 0752 01/03/18 1026 01/03/18 1654 01/04/18 0031  HGB 9.0*  --  8.7*  --   --   --  8.8*  HCT 26.7*  --  25.1*  --   --   --  26.0*  PLT 290  --  278  --   --   --  271  APTT 41*  --   --   --   --   --   --   LABPROT 15.2  --   --   --   --   --   --   INR 1.21  --   --   --   --   --   --   HEPARINUNFRC  --   --   --  <0.10*  --  <0.10* <0.10*  CREATININE 2.02*  --  1.84*  --   --   --  1.48*  TROPONINI 2.95*   < > 3.09*  --  1.95* 2.49* 2.17*   < > = values in this interval not displayed.    Estimated Creatinine Clearance: 65.2 mL/min (A) (by C-G formula based on SCr of 1.48 mg/dL (H)).   Medical History: Past Medical History:  Diagnosis Date  . Arthritis   . Asthma   . Atherosclerosis   . BPH (benign prostatic hyperplasia)   . Carotid arterial disease ([place])   . Charcot's joint of foot, right   . Chronic combined systolic (congestive) and diastolic (congestive) heart failure ([place])    a. 02/2013 EF 50% by LV gram; b. 04/2017 Echo: EF 25-30%. diff HK. Gr2 DD. Mod dil LA/RV. PASP 60mmHg.  [name] COPD (chronic obstructive pulmonary disease) ([place])   . Coronary artery disease    a. 2013 S/P PCI of LAD [facility]);  b. 03/2013 PCI: RCA 90p ( 2.5 x 23 mm DES); c. 02/2014 Cath: patent RCA stent->Med Rx; d. 04/2017 Cath: LM nl, LAd 30ost/p, 108m/d, D1 80ost, LCX 90p/m, OM1 85, RCA 70/20p, 75m, 70d->Referred for CT Surg-felt to be poor candidate.  .  Diabetic neuropathy ([place])   . Diabetic ulcer of right foot ([place])   . Hernia   . Hyperlipidemia   . Hypertension   . Ischemic cardiomyopathy    a. 04/2017 Echo: EF 25-30%; b. 08/2017 Cardiac MRI ([facility]): EF 19%, sev glob HK. RVEF 20%, mod BAE, triv MR, mild-mod TR. Basal lateral subendocardial infarct (viable), inf/infsept, dist septal ischemia, basal to mid lat peri-infarct ischemia.  . Morbid obesity ([place])   . Neuropathy   . PAD (peripheral artery disease) ([place])    a. Followed by Dr. Lucky Cowboy; b. 08/2010 Periph Angio: RSFA 70-80p (6X60 self-expanding stent); c. 09/2010 Periph Angio: L SFA 100p (7x unknown length self-expanding stent); d. 06/2015 Periph Angio: R SFA short segment occlusion (6x12 self-expanding stent).  . Restless leg syndrome   . Sleep apnea   . Subclavian artery stenosis, right ([place])   . Syncope and collapse   . Tobacco  use    a. 75+ yr hx - still smoking 1ppd, down from 2 ppd.  . Type II diabetes mellitus ([place])   . Varicose vein     Medications:  No anticoag in PTA meds.   Assessment: Trop 2.95. Pharmacy consulted for heparin drip dosing and montioring in 57yo patient for ACS.   Goal of Therapy:  Heparin level 0.3-0.7 units/ml Monitor platelets by anticoagulation protocol: Yes   Plan:   7/27 @ 1733 HL <0.10. Level is subtherapeutic. Will order 2700 unit bolus and increase infusion to heparin 1900 units/hr.  Recheck HL in 6 hours. CBC with am labs per protocol.   7/28 0030 heparin level <0.1. 2700 unit bolus and increase rate to 2250 units/hr. Recheck in 6 hours.  Eloise Harman, PharmD, BCPS Clinical Pharmacist 01/04/2018 2:32 AM

## 2018-01-04 NOTE — Consult Note (Addendum)
Reason for Consult: Diabetic RIGHT foot wound/ poor perfusion Referring Physician: Dr. Roxy Manns is an 57 y.o. male.  HPI: Patient with CAD, PAD, DM, Chronic foot ulcer, history of Right SFA stenting per Dr. Lucky Cowboy in 2017. Presents with worsening right foot infection. Now status post debridement. Significant concern of poor perfusion to foot. Patient examined post operatively- still sleepy. Unclear if patient has had rest pain but he does note short distance claudication.  Past Medical History:  Diagnosis Date  . Arthritis   . Asthma   . Atherosclerosis   . BPH (benign prostatic hyperplasia)   . Carotid arterial disease ([place])   . Charcot's joint of foot, right   . Chronic combined systolic (congestive) and diastolic (congestive) heart failure ([place])    a. 02/2013 EF 50% by LV gram; b. 04/2017 Echo: EF 25-30%. diff HK. Gr2 DD. Mod dil LA/RV. PASP 35mHg.  .Marland KitchenCOPD (chronic obstructive pulmonary disease) ([place]   . Coronary artery disease    a. 2013 S/P PCI of LAD ([facility];  b. 03/2013 PCI: RCA 90p ( 2.5 x 23 mm DES); c. 02/2014 Cath: patent RCA stent->Med Rx; d. 04/2017 Cath: LM nl, LAd 30ost/p, 68m, D1 80ost, LCX 90p/m, OM1 85, RCA 70/20p, 8078m0d->Referred for CT Surg-felt to be poor candidate.  . Diabetic neuropathy ([place]CC[place] . Diabetic ulcer of right foot (HCC[place] . Hernia   . Hyperlipidemia   . Hypertension   . Ischemic cardiomyopathy    a. 04/2017 Echo: EF 25-30%; b. 08/2017 Cardiac MRI ([facility]): EF 19%, sev glob HK. RVEF 20%, mod BAE, triv [name], mild-mod TR. Basal lateral subendocardial infarct (viable), inf/infsept, dist septal ischemia, basal to mid lat peri-infarct ischemia.  . Morbid obesity (HCCLoaza . Neuropathy   . PAD (peripheral artery disease) (HCC[place]  a. Followed by Dr. DewLucky Cowboy. 08/2010 Periph Angio: RSFA 70-80p (6X60 self-expanding stent); c. 09/2010 Periph Angio: L SFA 100p (7x unknown length self-expanding stent); d. 06/2015 Periph Angio: R SFA short segment occlusion (6x12  self-expanding stent).  . Restless leg syndrome   . Sleep apnea   . Subclavian artery stenosis, right (HCCWaverly . Syncope and collapse   . Tobacco use    a. 75+ yr hx - still smoking 1ppd, down from 2 ppd.  . Type II diabetes mellitus (HCCRiver Rouge . Varicose vein     Past Surgical History:  Procedure Laterality Date  . ABDOMINAL AORTAGRAM N/A 06/23/2013   Procedure: ABDOMINAL AORMaxcine HamSurgeon: MuhWellington HampshireD;  [facility];  Service: Cardiovascular;  Laterality: N/A;  . APPENDECTOMY    . CARDIAC CATHETERIZATION  10/14   [facility]; X1 STENT PROXIMAL RCA  . CARDIAC CATHETERIZATION  02/2010   [facility]  . CARDI[facility]  [facility]  . CLAVICLE SURGERY    . HERNIA REPAIR    . LEFT HEART CATH AND CORS/GRAFTS ANGIOGRAPHY N/A 04/28/2017   Procedure: LEFT HEART CATH AND CORONARY ANGIOGRAPHY;  Surgeon: AriWellington HampshireD;  [facility];  Service: Cardiovascular;  Laterality: N/A;  . PERIPHERAL ARTERIAL STENT GRAFT     x2 left/right  . PERIPHERAL VASCULAR CATHETERIZATION N/A 07/06/2015   Procedure: Abdominal Aortogram w/Lower Extremity;  Surgeon: JasAlgernon HuxleyD;  [facility];  Service: Cardiovascular;  Laterality: N/A;  . PERIPHERAL VASCULAR CATHETERIZATION  07/06/2015   Procedure: Lower Extremity Intervention;  Surgeon: JasAlgernon HuxleyD;  Location:  [place] [facility];  Service: Cardiovascular;;    Family History  Problem Relation Age of Onset  . Heart attack Father   . Heart disease Father   . Hypertension Father   . Hyperlipidemia Father   . Hypertension Mother   . Hyperlipidemia Mother     Social History:  reports that he has been smoking cigarettes.  He has a 41.00 pack-year smoking history. He has never used smokeless tobacco. He reports that he does not drink alcohol or use drugs.  Allergies:  Allergies  Allergen Reactions  . Dulaglutide Anaphylaxis, Diarrhea and Hives  . Prednisone Rash    Medications: I have  reviewed the patient's current medications.  Results for orders placed or performed during the hospital encounter of 01/02/18 (from the past 48 hour(s))  Lactic acid, plasma     Status: Abnormal   Collection Time: 01/02/18  9:53 PM  Result Value Ref Range   Lactic Acid, Venous 2.3 (HH) 0.5 - 1.9 mmol/L    Comment: CRITICAL RESULT CALLED TO, READ BACK BY AND VERIFIED WITH DEVIN WILKINS ON 01/02/18 AT [address] [facility] Performed at [facility], [address] [place], [place] [address]   Comprehensive metabolic panel     Status: Abnormal   Collection Time: 01/02/18  9:53 PM  Result Value Ref Range   Sodium 125 (L) 135 - 145 mmol/L   Potassium 5.4 (H) 3.5 - 5.1 mmol/L   Chloride 86 (L) 98 - 111 mmol/L   CO2 30 22 - 32 mmol/L   Glucose, Bld 472 (H) 70 - 99 mg/dL   BUN 33 (H) 6 - 20 mg/dL   Creatinine, Ser 2.02 (H) 0.61 - 1.24 mg/dL   Calcium 8.5 (L) 8.9 - 10.3 mg/dL   Total Protein 7.1 6.5 - 8.1 g/dL   Albumin 2.6 (L) 3.5 - 5.0 g/dL   AST 62 (H) 15 - 41 U/L   ALT 26 0 - 44 U/L   Alkaline Phosphatase 60 38 - 126 U/L   Total Bilirubin 1.1 0.3 - 1.2 mg/dL   GFR calc non Af Amer 35 (L) >60 mL/min   GFR calc Af Amer 41 (L) >60 mL/min    Comment: (NOTE) The eGFR has been calculated using the CKD EPI equation. This calculation has not been validated in all clinical situations. eGFR's persistently <60 mL/min signify possible Chronic Kidney Disease.    Anion gap 9 5 - 15    Comment: Performed at [facility], [address] [place], [place] [address]  Lipase, blood     Status: None   Collection Time: 01/02/18  9:53 PM  Result Value Ref Range   Lipase 27 11 - 51 U/L    Comment: Performed at [facility], [address] [place], [place] [address]  Troponin I     Status: Abnormal   Collection Time: 01/02/18  9:53 PM  Result Value Ref Range   Troponin I 2.95 (HH) <0.03 ng/mL    Comment: CRITICAL RESULT CALLED TO, READ BACK BY AND VERIFIED WITH SHERRIE ALLISON ON  01/02/18 AT [address] [facility] Performed at [facility], [address] [place], [place] [address]   CBC WITH DIFFERENTIAL     Status: Abnormal   Collection Time: 01/02/18  9:53 PM  Result Value Ref Range   WBC 20.6 (H) 3.8 - 10.6 K/uL   RBC 3.19 (L) 4.40 - 5.90 MIL/uL   Hemoglobin 9.0 (L) 13.0 - 18.0 g/dL   HCT 26.7 (L) 40.0 -  52.0 %   MCV 83.9 80.0 - 100.0 fL   MCH 28.1 26.0 - 34.0 pg   MCHC 33.5 32.0 - 36.0 g/dL   RDW 18.4 (H) 11.5 - 14.5 %   Platelets 290 150 - 440 K/uL   Neutrophils Relative % 89 %   Neutro Abs 18.2 (H) 1.4 - 6.5 K/uL   Lymphocytes Relative 4 %   Lymphs Abs 0.8 (L) 1.0 - 3.6 K/uL   Monocytes Relative 7 %   Monocytes Absolute 1.5 (H) 0.2 - 1.0 K/uL   Eosinophils Relative 0 %   Eosinophils Absolute 0.0 0 - 0.7 K/uL   Basophils Relative 0 %   Basophils Absolute 0.0 0 - 0.1 K/uL    Comment: Performed at [facility], [address] [place], [place] [address]  Procalcitonin     Status: None   Collection Time: 01/02/18  9:53 PM  Result Value Ref Range   Procalcitonin 0.94 ng/mL    Comment:        Interpretation: PCT > 0.5 ng/mL and <= 2 ng/mL: Systemic infection (sepsis) is possible, but other conditions are known to elevate PCT as well. (NOTE)       Sepsis PCT Algorithm           Lower Respiratory Tract                                      Infection PCT Algorithm    ----------------------------     ----------------------------         PCT < 0.25 ng/mL                PCT < 0.10 ng/mL         Strongly encourage             Strongly discourage   discontinuation of antibiotics    initiation of antibiotics    ----------------------------     -----------------------------       PCT 0.25 - 0.50 ng/mL            PCT 0.10 - 0.25 ng/mL               OR       >80% decrease in PCT            Discourage initiation of                                            antibiotics      Encourage discontinuation           of antibiotics     ----------------------------     -----------------------------         PCT >= 0.50 ng/mL              PCT 0.26 - 0.50 ng/mL                AND       <80% decrease in PCT             Encourage initiation of  antibiotics       Encourage continuation           of antibiotics    ----------------------------     -----------------------------        PCT >= 0.50 ng/mL                  PCT > 0.50 ng/mL               AND         increase in PCT                  Strongly encourage                                      initiation of antibiotics    Strongly encourage escalation           of antibiotics                                     -----------------------------                                           PCT <= 0.25 ng/mL                                                 OR                                        > 80% decrease in PCT                                     Discontinue / Do not initiate                                             antibiotics Performed at [facility], [address] [place], [place] [address]   Protime-INR     Status: None   Collection Time: 01/02/18  9:53 PM  Result Value Ref Range   Prothrombin Time 15.2 11.4 - 15.2 seconds   INR 1.21     Comment: Performed at [facility], [address], [place], [place] [address]  Blood Culture (routine x 2)     Status: None (Preliminary result)   Collection Time: 01/02/18  9:53 PM  Result Value Ref Range   Specimen Description BLOOD LEFT WRIST    Special Requests      BOTTLES DRAWN AEROBIC AND ANAEROBIC Blood Culture results may not be optimal due to an inadequate volume of blood received in culture bottles   Culture  Setup Time      Joes.  VALUE IS CONSISTENT WITH PREVIOUSLY REPORTED AND CALLED VALUE. Performed at [facility], [address] [place], [place] [address]  Culture GRAM  POSITIVE COCCI    Report Status PENDING   Blood Culture (routine x 2)     Status: Abnormal (Preliminary result)   Collection Time: 01/02/18  9:53 PM  Result Value Ref Range   Specimen Description      BLOOD LEFT ANTECUBITAL Performed at [facility], [address] [place], [place] [address]    Special Requests      BOTTLES DRAWN AEROBIC AND ANAEROBIC Blood Culture results may not be optimal due to an inadequate volume of blood received in culture bottles Performed at [facility], [address] [place], [place] [address]    Culture  Setup Time      GRAM POSITIVE COCCI IN BOTH AEROBIC AND ANAEROBIC BOTTLES CRITICAL RESULT CALLED TO, READ BACK BY AND VERIFIED WITH: Dicie Beam 01/03/18 @ 1055  [place]    Culture (A)     GROUP B STREP(S.AGALACTIAE)ISOLATED SUSCEPTIBILITIES TO FOLLOW Performed at [facility], [place] [address] [place], [place] [address]    Report Status PENDING   APTT     Status: Abnormal   Collection Time: 01/02/18  9:53 PM  Result Value Ref Range   aPTT 41 (H) 24 - 36 seconds    Comment:        IF BASELINE aPTT IS ELEVATED, SUGGEST PATIENT RISK ASSESSMENT BE USED TO DETERMINE APPROPRIATE ANTICOAGULANT THERAPY. Performed at [facility], [address] [place], [place] [address]   Blood Culture ID Panel (Reflexed)     Status: Abnormal   Collection Time: 01/02/18  9:53 PM  Result Value Ref Range   Enterococcus species NOT DETECTED NOT DETECTED   Listeria monocytogenes NOT DETECTED NOT DETECTED   Staphylococcus species NOT DETECTED NOT DETECTED   Staphylococcus aureus NOT DETECTED NOT DETECTED   Streptococcus species DETECTED (A) NOT DETECTED    Comment: CRITICAL RESULT CALLED TO, READ BACK BY AND VERIFIED WITH: Dicie Beam 01/03/18 @ 1055  MLK    Streptococcus agalactiae DETECTED (A) NOT DETECTED    Comment: CRITICAL RESULT CALLED TO, READ BACK BY AND VERIFIED WITH: Dicie Beam 01/03/18 @ 1055  [place]    Streptococcus  pneumoniae NOT DETECTED NOT DETECTED   Streptococcus pyogenes NOT DETECTED NOT DETECTED   Acinetobacter baumannii NOT DETECTED NOT DETECTED   Enterobacteriaceae species NOT DETECTED NOT DETECTED   Enterobacter cloacae complex NOT DETECTED NOT DETECTED   Escherichia coli NOT DETECTED NOT DETECTED   Klebsiella oxytoca NOT DETECTED NOT DETECTED   Klebsiella pneumoniae NOT DETECTED NOT DETECTED   Proteus species NOT DETECTED NOT DETECTED   Serratia marcescens NOT DETECTED NOT DETECTED   Haemophilus influenzae NOT DETECTED NOT DETECTED   Neisseria meningitidis NOT DETECTED NOT DETECTED   Pseudomonas aeruginosa NOT DETECTED NOT DETECTED   Candida albicans NOT DETECTED NOT DETECTED   Candida glabrata NOT DETECTED NOT DETECTED   Candida krusei NOT DETECTED NOT DETECTED   Candida parapsilosis NOT DETECTED NOT DETECTED   Candida tropicalis NOT DETECTED NOT DETECTED    Comment: Performed at [facility], [address] [place], [place] [address]  Lactic acid, plasma     Status: None   Collection Time: 01/03/18 12:25 AM  Result Value Ref Range   Lactic Acid, Venous 1.3 0.5 - 1.9 mmol/L    Comment: Performed at [facility], [address] [place], [place] [address]  Troponin I     Status: Abnormal   Collection Time: 01/03/18  1:37 AM  Result Value Ref Range  Troponin I 3.52 (HH) <0.03 ng/mL    Comment: CRITICAL VALUE NOTED. VALUE IS CONSISTENT WITH PREVIOUSLY REPORTED/CALLED VALUE [facility] Performed at [facility], [address] [place], [place] [address]   Aerobic/Anaerobic Culture (surgical/deep wound)     Status: None (Preliminary result)   Collection Time: 01/03/18  3:49 AM  Result Value Ref Range   Specimen Description      WOUND RIGHT FOOT Performed at [facility], [address] [place], [place] [address]    Special Requests      NONE Performed at [facility], [place], [place] [address]    Gram Stain      RARE  WBC PRESENT, PREDOMINANTLY PMN ABUNDANT GRAM POSITIVE COCCI IN PAIRS IN CLUSTERS FEW GRAM NEGATIVE RODS Performed at [facility], [place] [address] [place], [place] [address]    Culture PENDING    Report Status PENDING   Basic metabolic panel     Status: Abnormal   Collection Time: 01/03/18  4:53 AM  Result Value Ref Range   Sodium 129 (L) 135 - 145 mmol/L   Potassium 4.4 3.5 - 5.1 mmol/L   Chloride 92 (L) 98 - 111 mmol/L   CO2 29 22 - 32 mmol/L   Glucose, Bld 360 (H) 70 - 99 mg/dL   BUN 34 (H) 6 - 20 mg/dL   Creatinine, Ser 1.84 (H) 0.61 - 1.24 mg/dL   Calcium 8.2 (L) 8.9 - 10.3 mg/dL   GFR calc non Af Amer 40 (L) >60 mL/min   GFR calc Af Amer 46 (L) >60 mL/min    Comment: (NOTE) The eGFR has been calculated using the CKD EPI equation. This calculation has not been validated in all clinical situations. eGFR's persistently <60 mL/min signify possible Chronic Kidney Disease.    Anion gap 8 5 - 15    Comment: Performed at [facility], [address] [place], [place] [address]  CBC     Status: Abnormal   Collection Time: 01/03/18  4:53 AM  Result Value Ref Range   WBC 17.7 (H) 3.8 - 10.6 K/uL   RBC 3.00 (L) 4.40 - 5.90 MIL/uL   Hemoglobin 8.7 (L) 13.0 - 18.0 g/dL   HCT 25.1 (L) 40.0 - 52.0 %   MCV 83.8 80.0 - 100.0 fL   MCH 28.9 26.0 - 34.0 pg   MCHC 34.5 32.0 - 36.0 g/dL   RDW 18.0 (H) 11.5 - 14.5 %   Platelets 278 150 - 440 K/uL    Comment: Performed at [facility], [address] [place], [place] [address]  Troponin I     Status: Abnormal   Collection Time: 01/03/18  4:53 AM  Result Value Ref Range   Troponin I 3.09 (HH) <0.03 ng/mL    Comment: CRITICAL VALUE NOTED. VALUE IS CONSISTENT WITH PREVIOUSLY REPORTED/CALLED VALUE SNJ Performed at [facility], [place], [place] [address]   Heparin level (unfractionated)     Status: Abnormal   Collection Time: 01/03/18  7:52 AM  Result Value Ref Range   Heparin  Unfractionated <0.10 (L) 0.30 - 0.70 IU/mL    Comment: (NOTE) If heparin results are below expected values, and patient dosage has  been confirmed, suggest follow up testing of antithrombin III levels. Performed at [facility], [address], [place], [place] [address]   Troponin I     Status: Abnormal   Collection Time: 01/03/18 10:26 AM  Result Value  Ref Range   Troponin I 1.95 (HH) <0.03 ng/mL    Comment: CRITICAL VALUE NOTED. VALUE IS CONSISTENT WITH PREVIOUSLY REPORTED/CALLED VALUE / JAG Performed at [facility], [address] [place], [place] [address]   Troponin I     Status: Abnormal   Collection Time: 01/03/18  4:54 PM  Result Value Ref Range   Troponin I 2.49 (HH) <0.03 ng/mL    Comment: CRITICAL VALUE NOTED. VALUE IS CONSISTENT WITH PREVIOUSLY REPORTED/CALLED VALUE JJB Performed at [facility], [place], [place] [address]   Heparin level (unfractionated)     Status: Abnormal   Collection Time: 01/03/18  4:54 PM  Result Value Ref Range   Heparin Unfractionated <0.10 (L) 0.30 - 0.70 IU/mL    Comment: (NOTE) If heparin results are below expected values, and patient dosage has  been confirmed, suggest follow up testing of antithrombin III levels. Performed at [facility], [address] [place], [place] [address]   Glucose, capillary     Status: Abnormal   Collection Time: 01/03/18  5:56 PM  Result Value Ref Range   Glucose-Capillary 296 (H) 70 - 99 mg/dL  Glucose, capillary     Status: Abnormal   Collection Time: 01/03/18  9:35 PM  Result Value Ref Range   Glucose-Capillary 361 (H) 70 - 99 mg/dL   Comment 1 Notify RN    Comment 2 Document in Chart   CBC     Status: Abnormal   Collection Time: 01/04/18 12:31 AM  Result Value Ref Range   WBC 17.6 (H) 3.8 - 10.6 K/uL   RBC 3.13 (L) 4.40 - 5.90 MIL/uL   Hemoglobin 8.8 (L) 13.0 - 18.0 g/dL   HCT 26.0 (L) 40.0 - 52.0 %   MCV 83.3 80.0 - 100.0 fL   MCH  28.0 26.0 - 34.0 pg   MCHC 33.6 32.0 - 36.0 g/dL   RDW 18.0 (H) 11.5 - 14.5 %   Platelets 271 150 - 440 K/uL    Comment: Performed at [facility], [address] [place], [place] [address]  Basic metabolic panel     Status: Abnormal   Collection Time: 01/04/18 12:31 AM  Result Value Ref Range   Sodium 127 (L) 135 - 145 mmol/L   Potassium 5.2 (H) 3.5 - 5.1 mmol/L   Chloride 93 (L) 98 - 111 mmol/L   CO2 27 22 - 32 mmol/L   Glucose, Bld 364 (H) 70 - 99 mg/dL   BUN 39 (H) 6 - 20 mg/dL   Creatinine, Ser 1.48 (H) 0.61 - 1.24 mg/dL   Calcium 8.5 (L) 8.9 - 10.3 mg/dL   GFR calc non Af Amer 52 (L) >60 mL/min   GFR calc Af Amer 60 (L) >60 mL/min    Comment: (NOTE) The eGFR has been calculated using the CKD EPI equation. This calculation has not been validated in all clinical situations. eGFR's persistently <60 mL/min signify possible Chronic Kidney Disease.    Anion gap 7 5 - 15    Comment: Performed at [facility], [place], [place] [address]  Heparin level (unfractionated)     Status: Abnormal   Collection Time: 01/04/18 12:31 AM  Result Value Ref Range   Heparin Unfractionated <0.10 (L) 0.30 - 0.70 IU/mL    Comment: (NOTE) If heparin results are below expected values, and patient dosage has  been confirmed, suggest follow up testing of antithrombin III levels. Performed at [facility],  [place], [place] [address]   Troponin I     Status: Abnormal   Collection Time: 01/04/18 12:31 AM  Result Value Ref Range   Troponin I 2.17 (HH) <0.03 ng/mL    Comment: CRITICAL VALUE NOTED. VALUE IS CONSISTENT WITH PREVIOUSLY REPORTED/CALLED VALUE [facility] Performed at [facility], [address] [place], [place] [address]   Glucose, capillary     Status: Abnormal   Collection Time: 01/04/18 12:39 AM  Result Value Ref Range   Glucose-Capillary 352 (H) 70 - 99 mg/dL  Lactic acid, plasma     Status: None   Collection Time:  01/04/18  1:15 AM  Result Value Ref Range   Lactic Acid, Venous 1.2 0.5 - 1.9 mmol/L    Comment: Performed at [facility], [address] [place], [place] [address]  Glucose, capillary     Status: Abnormal   Collection Time: 01/04/18  9:43 AM  Result Value Ref Range   Glucose-Capillary 409 (H) 70 - 99 mg/dL    Dg Chest 2 View  Result Date: 01/02/2018 CLINICAL DATA:  Weakness and tremors. Gait abnormalities and memory issues. Sepsis. EXAM: CHEST - 2 VIEW COMPARISON:  12/21/2017 FINDINGS: Mild cardiac enlargement. No vascular congestion, edema, or consolidation. No blunting of costophrenic angles. No pneumothorax. Calcification of the aorta. Mediastinal contours appear intact. Degenerative changes in the spine and shoulders. Old resection or resorption of the distal right clavicle. IMPRESSION: Mild cardiac enlargement. No evidence of active pulmonary disease. Aortic atherosclerosis. Electronically Signed   By: Lucienne Capers M.D.   On: 01/02/2018 21:51   [name] [name] Contrast  Result Date: 01/03/2018 CLINICAL DATA:  Large open wound near the base of the fifth metatarsal. EXAM: MRI OF THE RIGHT FOREFOOT WITHOUT CONTRAST TECHNIQUE: Multiplanar, multisequence [name] imaging of the right foot was performed. No intravenous contrast was administered. COMPARISON:  Radiograph 01/02/2018 FINDINGS: Exam is somewhat limited by patient motion and lack of IV contrast. There is a large open wound on the plantar and lateral aspect of the midfoot at the level of the base of the fifth metatarsal. Diffuse surrounding cellulitis but no discrete drainable soft tissue abscess. Diffuse abnormal T1 and T2 signal intensity in the cuboid, fifth metatarsal and base of the fourth metatarsal. There is also fluid in the cuboid articulation with the fourth and fifth metatarsals. Findings consistent with septic arthritis and osteomyelitis. Mild edema like signal abnormality in the midfoot bony structures more medially  likely related to advanced degenerative disease but osteomyelitis can't be totally excluded. The tibiotalar and subtalar joints are maintained. Small joint effusions. Diffuse myositis without definite findings for pyomyositis. IMPRESSION: 1. Large plantar and lateral open wound with associated significant cellulitis and myofasciitis. No discrete drainable soft tissue abscess or pyomyositis. 2. [name] findings consistent with septic arthritis at the cuboid articulation with the fourth and fifth metatarsal bases and associated osteomyelitis. 3. Midfoot degenerative changes and edema like signal abnormality, likely degenerative but could not exclude osteomyelitis. Electronically Signed   By: Marijo Sanes M.D.   On: 01/03/2018 13:37   Dg Chest Port 1 View  Result Date: 01/03/2018 CLINICAL DATA:  Acute shortness of breath for 2 days. EXAM: PORTABLE CHEST 1 VIEW COMPARISON:  01/02/2018 FINDINGS: Cardiomegaly and pulmonary vascular congestion again noted. Interstitial prominence is unchanged. There is no evidence of focal airspace disease, pulmonary edema, suspicious pulmonary nodule/mass, pleural effusion, or pneumothorax. No acute bony abnormalities are identified. IMPRESSION: Cardiomegaly with pulmonary vascular congestion. Interstitial prominence  may represent mild interstitial edema. Electronically Signed   By: Margarette Canada M.D.   On: 01/03/2018 15:46   Dg Foot Complete Right  Result Date: 01/02/2018 CLINICAL DATA:  Diabetic ulcer at the base of the fifth metatarsal bone. EXAM: RIGHT FOOT COMPLETE - 3+ VIEW COMPARISON:  Right foot radiograph 12/22/2017. MRI right foot 12/23/2017 FINDINGS: Large soft tissue ulceration over the plantar surface of the lateral right foot at the level of the fifth metatarsal base. This was present on the previous study. In the interval, there is development of soft tissue gas along the dorsum of the right foot. This likely reflects progression of infection with gas-forming organism. No  definite radiographic changes to suggest osteomyelitis, but changes of osteomyelitis were demonstrated in the fourth and fifth metatarsal bones on the previous MRI. Degenerative changes in the interphalangeal and intertarsal joints. Plantar calcaneal spur. No radiopaque soft tissue foreign bodies. IMPRESSION: Large soft tissue ulceration over the plantar surface of the right foot at the level of the fifth metatarsal base as seen previously. New soft tissue gas along the dorsum of the right foot likely representing progression of infection with gas-forming organism. No radiographic evidence of osteomyelitis, but previous MRI showed evidence of osteomyelitis in the fourth and fifth metatarsal bones. Electronically Signed   By: Lucienne Capers M.D.   On: 01/02/2018 22:46    ROS Blood pressure 129/82, pulse 93, temperature 97.7 F (36.5 C), temperature source Oral, resp. rate 20, height '5\' 7"'$  (1.702 m), weight 104 kg (229 lb 3.2 oz), SpO2 100 %. Physical Exam  Nursing note and vitals reviewed. Constitutional: He appears well-developed.  Cardiovascular: Normal rate.  RIGHT foot bandaged postop- however per report: Right foot: Absent DP and PT pulses.  Monophasic posterior tibial pulse and barely monophasic dorsalis pedis pulse.      Respiratory: Effort normal.  GI: Soft. He exhibits no distension.  Musculoskeletal: He exhibits edema.  Right leg/foot  Skin: Skin is warm.    Assessment/Plan: Right foot diabetic foot infection with evidence of diminished perfusion. History of SFA stenting.  Concern of Cardiac status, severe 3 vessel CAD, ischemic cardiomyopathy- patient requires CABG when medically optimized.  Although patient does have diminished perfusion to foot would hold off on immediate intervention until better stabilized. Per report no evidence of acute ischemia or gangrene.  Will discuss with Dr. Lucky Cowboy for further plans.  Esco, Miechia A 01/04/2018, 10:51 AM

## 2018-01-04 NOTE — Transfer of Care (Signed)
Immediate Anesthesia Transfer of Care Note  Patient: [name] [name]  Procedure(s) Performed: IRRIGATION AND DEBRIDEMENT FOOT (Right )  Patient Location: PACU  Anesthesia Type:MAC  Level of Consciousness: awake, alert  and oriented  Airway & Oxygen Therapy: Patient Spontanous Breathing and Patient connected to face mask oxygen  Post-op Assessment: Report given to RN and Post -op Vital signs reviewed and stable  Post vital signs: Reviewed and stable  Last Vitals:  Vitals Value Taken Time  BP 119/76 01/04/2018  9:31 AM  Temp 36.7 C 01/04/2018  9:30 AM  Pulse    Resp 35 01/04/2018  9:32 AM  SpO2    Vitals shown include unvalidated device data.  Last Pain:  Vitals:   01/04/18 0719  TempSrc:   PainSc: 7          Complications: No apparent anesthesia complications

## 2018-01-04 NOTE — Plan of Care (Signed)
  Problem: Skin Integrity: Goal: Risk for impaired skin integrity will decrease Outcome: Progressing   

## 2018-01-04 NOTE — Progress Notes (Signed)
In to see pt with RT. Pt diaphoretic, vital signs taken, WDL.  Bed linens changed. Pt alert with some confusion. ICU charge RN and [facility] in to see pt with this Rn. MD notified, orders placed. Will continue to monitor and assess.

## 2018-01-04 NOTE — Progress Notes (Signed)
Pts CBGis 503. MD notified. Orders for 20 units novolog received. I will continue to assess.

## 2018-01-04 NOTE — Progress Notes (Signed)
Patient underwent operative I&D of the right foot for drainage of infection.  Fair amount of infection was noted dorsally to the foot.  Plantar bone was exposed and removed.  Area was packed with vancomycin beads and a silver impregnated dressing was applied with a wound VAC.    Patient has positive blood cultures.  I have asked vascular surgery to evaluate as patient had nonpalpable pulses and a monophasic audible DP and PT pulse.  Patient had only minimal bleeding along the surgical site.  Epinephrine was used along the surgical site.  No tourniquet used.  Suspect patient will need long-term IV antibiotics.  Will need PICC line with infectious disease consultation as well.  We will follow while in-house.

## 2018-01-04 NOTE — Progress Notes (Signed)
Right foot pink and warm to touch.  Patient able To move foot and toes wnl.  Gauze dressing dry And intact.

## 2018-01-04 NOTE — Progress Notes (Signed)
15 minute call to floor. 

## 2018-01-04 NOTE — Progress Notes (Signed)
Pt CBG 479 with no sliding scale insulin, only long acting 60U lantus. Pt receiving IV steroids. MD paged, Dr. Jannifer Franklin to put in for 20U IV insulin to give now, and to recheck at 11pm, will also adjust sliding scale insulin. Will give insulin & recheck blood sugar at 11pm.  Conley Simmonds, RN, BSN

## 2018-01-04 NOTE — Progress Notes (Signed)
Pt is very agitated. Pt stating he wants to leave and is very anxious. MD notified. Orders for xanax received . I will continue to assess.

## 2018-01-04 NOTE — Progress Notes (Signed)
Md notified. Pt had 8 beats of VT per CCMD. I will continue to assess.

## 2018-01-04 NOTE — Op Note (Signed)
Operative note   Surgeon:Enrique Manganaro Lawyer: None    Preop diagnosis: 1.  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  Gas producing infection dorsal right foot    Postop diagnosis: Same    Procedure: 1.  Excision fifth metatarsal and fourth metatarsal base 2.  Incision and drainage dorsal right midfoot    EBL: Minimal    Anesthesia:local and IV sedation.  Local consisted of 1% lidocaine with epinephrine infiltrated along the incision sites    Hemostasis: Epinephrine infiltrated along the incision    Specimen: Deep wound culture dorsal foot and bone plantar lateral right foot    Complications: None    Operative indications:[name] [name] is an 57 y.o. that presents today for surgical intervention.  The risks/benefits/alternatives/complications have been discussed and consent has been given.    Procedure:  Patient was brought into the OR and placed on the operating table in thesupine position. After anesthesia was obtained theright lower extremity was prepped and draped in usual sterile fashion.  Attention was initially directed to the dorsal right midfoot where a dorsal longitudinal incision was made from the midtarsal joint extending to just anterior to the ankle joint.  Upon incision purulent drainage was noted.  The incision was deepened down to the level of the deep fascia and bone region.  Blunt dissection carried out anterior medial and lateral.  Large amount of purulent drainage was noted diffusely through this area.  At this time the wound was then flushed with a pulse lavage with copious amounts of irrigation.  Closure of the wound was performed dorsally with a 2-0 nylon with the proximal one third of the incision left open and packed with iodoform packing.  Attention was directed plantarly where the noted large ulceration was found under the fifth metatarsal and plantar to the fourth metatarsal cuboid regions.  This time the obvious infected  bone and exposed bone was excised with a power saw.  The dissection was carried lateral.  The fourth metatarsal base was noted and a small portion was transected as well.  This was all sent for pathological examination.  This wound was then flushed with copious amounts of irrigation.  The wound was then packed with vancomycin impregnated stimulan beads.  The wound was covered with Mepitel and a silver impregnated wound VAC dressing and wound VAC was placed over the plantar aspect of the foot.    Patient tolerated the procedure and anesthesia well.  Was transported from the OR to the PACU with all vital signs stable and vascular status intact. To be discharged per routine protocol.  Will follow up in approximately 1 week in the outpatient clinic.

## 2018-01-04 NOTE — Progress Notes (Signed)
Dr. Rosey Bath notified of 409 blood sugar, gave verbal order for 5 units of regular insulin iv and recheck Fingerstick in 30 minutes.

## 2018-01-04 NOTE — Progress Notes (Signed)
Pts CBG is 413. MD notified and 15 units ordered for one time dose. I will continue to assess.

## 2018-01-04 NOTE — Progress Notes (Addendum)
[place] [facility]                                                                                                                                                                                  Patient Demographics   [name] [name], is a 57 y.o. male, DOB - [birth year], SKA:[id number]  Admit date - 01/02/2018   Admitting Physician Lance Coon, MD  Outpatient Primary MD for the patient is Clarisse Gouge, MD   LOS - 2  Subjective: Patient underwent surgery earlier with podiatry currently feeling better.  Review of Systems:   CONSTITUTIONAL: No documented fever. No fatigue, weakness. No weight gain, no weight loss.  EYES: No blurry or double vision.  ENT: No tinnitus. No postnasal drip. No redness of the oropharynx.  RESPIRATORY: No cough, no wheeze, no hemoptysis. No dyspnea.  CARDIOVASCULAR: No chest pain. No orthopnea. No palpitations. No syncope.  GASTROINTESTINAL: No nausea, no vomiting or diarrhea. No abdominal pain. No melena or hematochezia.  GENITOURINARY: No dysuria or hematuria.  ENDOCRINE: No polyuria or nocturia. No heat or cold intolerance.  HEMATOLOGY: No anemia. No bruising. No bleeding.  INTEGUMENTARY: No rashes. No lesions.  MUSCULOSKELETAL: Positive foot pain NEUROLOGIC: No numbness, tingling, or ataxia. No seizure-type activity.  PSYCHIATRIC: No anxiety. No insomnia. No ADD.    Vitals:   Vitals:   01/04/18 1015 01/04/18 1046 01/04/18 1319 01/04/18 1511  BP: 124/83 129/82  117/67  Pulse: 61 93  93  Resp: 15 20  19   Temp: 98 F (36.7 C) 97.7 F (36.5 C)  97.9 F (36.6 C)  TempSrc:  Oral  Oral  SpO2: 100%  98% 94%  Weight:      Height:        Wt Readings from Last 3 Encounters:  01/04/18 104 kg (229 lb 3.2 oz)  12/23/17 105.9 kg (233 lb 7.5 oz)  11/23/17 104.5 kg (230 lb 4.8 oz)     Intake/Output Summary (Last 24 hours) at 01/04/2018 1512 Last data filed at 01/04/2018 2035 Gross per 24 hour  Intake 559.8 ml   Output 680 ml  Net -120.2 ml    Physical Exam:   GENERAL: Pleasant-appearing in no apparent distress.  HEAD, EYES, EARS, NOSE AND THROAT: Atraumatic, normocephalic. Extraocular muscles are intact. Pupils equal and reactive to light. Sclerae anicteric. No conjunctival injection. No oro-pharyngeal erythema.  NECK: Supple. There is no jugular venous distention. No bruits, no lymphadenopathy, no thyromegaly.  HEART: Regular rate and rhythm,. No murmurs, no rubs, no clicks.  LUNGS: Clear to auscultation bilaterally. No rales or rhonchi. No wheezes.  ABDOMEN: Soft, flat, nontender, nondistended. Has good bowel sounds. No hepatosplenomegaly  appreciated.  EXTREMITIES: He has dressing in place on the right foot NEUROLOGIC: The patient is alert, awake, and oriented x3 with no focal motor or sensory deficits appreciated bilaterally.  SKIN: Moist and warm with no rashes appreciated.  Psych: Not anxious, depressed LN: No inguinal LN enlargement    Antibiotics   Anti-infectives (From admission, onward)   Start     Dose/Rate Route Frequency Ordered Stop   01/04/18 2200  ceFEPIme (MAXIPIME) 2 g in sodium chloride 0.9 % 100 mL IVPB     2 g 200 mL/hr over 30 Minutes Intravenous Every 8 hours 01/04/18 1321     01/04/18 1400  vancomycin (VANCOCIN) IVPB 1000 mg/200 mL premix     1,000 mg 200 mL/hr over 60 Minutes Intravenous Every 12 hours 01/04/18 1318     01/03/18 1000  ceFEPIme (MAXIPIME) 2 g in sodium chloride 0.9 % 100 mL IVPB  Status:  Discontinued     2 g 200 mL/hr over 30 Minutes Intravenous Every 12 hours 01/03/18 0052 01/04/18 1321   01/03/18 0600  vancomycin (VANCOCIN) IVPB 1000 mg/200 mL premix  Status:  Discontinued     1,000 mg 200 mL/hr over 60 Minutes Intravenous Every 18 hours 01/03/18 0052 01/04/18 1318   01/02/18 2230  metroNIDAZOLE (FLAGYL) IVPB 500 mg     500 mg 100 mL/hr over 60 Minutes Intravenous  Once 01/02/18 2227 01/03/18 0032   01/02/18 2145  ceFEPIme (MAXIPIME) 2 g in  sodium chloride 0.9 % 100 mL IVPB     2 g 200 mL/hr over 30 Minutes Intravenous  Once 01/02/18 2133 01/02/18 2310   01/02/18 2145  vancomycin (VANCOCIN) IVPB 1000 mg/200 mL premix     1,000 mg 200 mL/hr over 60 Minutes Intravenous  Once 01/02/18 2133 01/02/18 2335      Medications   Scheduled Meds: . aspirin EC  81 mg Oral Daily  . atorvastatin  80 mg Oral Daily  . carvedilol  6.25 mg Oral BID  . clopidogrel  75 mg Oral Daily  . fentaNYL      . insulin aspart  0-9 Units Subcutaneous TID WC  . insulin glargine  60 Units Subcutaneous QHS  . ipratropium-albuterol  3 mL Nebulization Q6H  . methylPREDNISolone (SOLU-MEDROL) injection  60 mg Intravenous Q6H  . nicotine  21 mg Transdermal Daily  . traZODone  50 mg Oral QHS   Continuous Infusions: . ceFEPime (MAXIPIME) IV    . heparin 2,550 Units/hr (01/04/18 1221)  . vancomycin 1,000 mg (01/04/18 1510)   PRN Meds:.acetaminophen **OR** acetaminophen, diphenhydrAMINE, ondansetron **OR** ondansetron (ZOFRAN) IV, oxyCODONE   Data Review:   Micro Results Recent Results (from the past 240 hour(s))  Blood Culture (routine x 2)     Status: None (Preliminary result)   Collection Time: 01/02/18  9:53 PM  Result Value Ref Range Status   Specimen Description BLOOD LEFT WRIST  Final   Special Requests   Final    BOTTLES DRAWN AEROBIC AND ANAEROBIC Blood Culture results may not be optimal due to an inadequate volume of blood received in culture bottles   Culture  Setup Time   Final    GRAM POSITIVE COCCI ANAEROBIC BOTTLE ONLY CRITICAL VALUE NOTED.  VALUE IS CONSISTENT WITH PREVIOUSLY REPORTED AND CALLED VALUE. Performed at [facility], [address] [place], [place] [address]    Culture [facility] POSITIVE COCCI  Final   Report Status PENDING  Incomplete  Blood Culture (routine x 2)  Status: Abnormal (Preliminary result)   Collection Time: 01/02/18  9:53 PM  Result Value Ref Range Status   Specimen Description   Final     BLOOD LEFT ANTECUBITAL Performed at [facility], [address] [place], [place] [address]    Special Requests   Final    BOTTLES DRAWN AEROBIC AND ANAEROBIC Blood Culture results may not be optimal due to an inadequate volume of blood received in culture bottles Performed at [facility], [address] [place], [place] [address]    Culture  Setup Time   Final    GRAM POSITIVE COCCI IN BOTH AEROBIC AND ANAEROBIC BOTTLES CRITICAL RESULT CALLED TO, READ BACK BY AND VERIFIED WITH: Dicie Beam 01/03/18 @ 1055  [place]    Culture (A)  Final    GROUP B STREP(S.AGALACTIAE)ISOLATED SUSCEPTIBILITIES TO FOLLOW Performed at [facility], [place] [address] [place], [place] [address]    Report Status PENDING  Incomplete  Blood Culture ID Panel (Reflexed)     Status: Abnormal   Collection Time: 01/02/18  9:53 PM  Result Value Ref Range Status   Enterococcus species NOT DETECTED NOT DETECTED Final   Listeria monocytogenes NOT DETECTED NOT DETECTED Final   Staphylococcus species NOT DETECTED NOT DETECTED Final   Staphylococcus aureus NOT DETECTED NOT DETECTED Final   Streptococcus species DETECTED (A) NOT DETECTED Final    Comment: CRITICAL RESULT CALLED TO, READ BACK BY AND VERIFIED WITH: TELDRIN JAMES 01/03/18 @ 1055  [place]    Streptococcus agalactiae DETECTED (A) NOT DETECTED Final    Comment: CRITICAL RESULT CALLED TO, READ BACK BY AND VERIFIED WITH: Dicie Beam 01/03/18 @ 1055  [place]    Streptococcus pneumoniae NOT DETECTED NOT DETECTED Final   Streptococcus pyogenes NOT DETECTED NOT DETECTED Final   Acinetobacter baumannii NOT DETECTED NOT DETECTED Final   Enterobacteriaceae species NOT DETECTED NOT DETECTED Final   Enterobacter cloacae complex NOT DETECTED NOT DETECTED Final   Escherichia coli NOT DETECTED NOT DETECTED Final   Klebsiella oxytoca NOT DETECTED NOT DETECTED Final   Klebsiella pneumoniae NOT DETECTED NOT DETECTED Final   Proteus species NOT DETECTED  NOT DETECTED Final   Serratia marcescens NOT DETECTED NOT DETECTED Final   Haemophilus influenzae NOT DETECTED NOT DETECTED Final   Neisseria meningitidis NOT DETECTED NOT DETECTED Final   Pseudomonas aeruginosa NOT DETECTED NOT DETECTED Final   Candida albicans NOT DETECTED NOT DETECTED Final   Candida glabrata NOT DETECTED NOT DETECTED Final   Candida krusei NOT DETECTED NOT DETECTED Final   Candida parapsilosis NOT DETECTED NOT DETECTED Final   Candida tropicalis NOT DETECTED NOT DETECTED Final    Comment: Performed at [facility], [address] [place], [place] [address]  Aerobic/Anaerobic Culture (surgical/deep wound)     Status: None (Preliminary result)   Collection Time: 01/03/18  3:49 AM  Result Value Ref Range Status   Specimen Description   Final    WOUND RIGHT FOOT Performed at [facility], [address], [place],  [address]    Special Requests   Final    NONE Performed at [facility], [address] [place], [place] [address]    Gram Stain   Final    RARE WBC PRESENT, PREDOMINANTLY PMN ABUNDANT GRAM POSITIVE COCCI IN PAIRS IN CLUSTERS FEW GRAM NEGATIVE RODS    Culture   Final    ABUNDANT GROUP B STREP(S.AGALACTIAE)ISOLATED TESTING AGAINST S. AGALACTIAE NOT ROUTINELY PERFORMED DUE TO PREDICTABILITY OF AMP/PEN/VAN  SUSCEPTIBILITY. Performed at [facility], [place] [address] [place], [place] [address]    Report Status PENDING  Incomplete  Aerobic/Anaerobic Culture (surgical/deep wound)     Status: None (Preliminary result)   Collection Time: 01/04/18  8:59 AM  Result Value Ref Range Status   Specimen Description WOUND RIGHT FOOT  Final   Special Requests PATIENT ON FOLLOWING VANC CEFEPIME FLAGYL  Final   Gram Stain   Final    FEW WBC PRESENT, PREDOMINANTLY PMN ABUNDANT GRAM POSITIVE COCCI IN PAIRS IN CLUSTERS Performed at [facility], [place] [address] [place], [place] [address]    Culture PENDING  Incomplete    Report Status PENDING  Incomplete    Radiology Reports Dg Chest 2 View  Result Date: 01/02/2018 CLINICAL DATA:  Weakness and tremors. Gait abnormalities and memory issues. Sepsis. EXAM: CHEST - 2 VIEW COMPARISON:  12/21/2017 FINDINGS: Mild cardiac enlargement. No vascular congestion, edema, or consolidation. No blunting of costophrenic angles. No pneumothorax. Calcification of the aorta. Mediastinal contours appear intact. Degenerative changes in the spine and shoulders. Old resection or resorption of the distal right clavicle. IMPRESSION: Mild cardiac enlargement. No evidence of active pulmonary disease. Aortic atherosclerosis. Electronically Signed   By: Lucienne Capers M.D.   On: 01/02/2018 21:51   Dg Chest 2 View  Result Date: 12/21/2017 CLINICAL DATA:  Patient reports upper chest pain (points to base of neck) all day that has gotten worse over the days. Hx/o asthma, carotid arterial disease, CHF, COPD, PAD, and diabetes Smoker EXAM: CHEST - 2 VIEW COMPARISON:  11/21/2017 FINDINGS: Mild hyperinflation. Midline trachea. Normal heart size. Atherosclerosis in the transverse aorta. No pleural effusion or pneumothorax. Diffuse peribronchial thickening. Clear lungs. IMPRESSION: No acute cardiopulmonary disease. Hyperinflation and mild interstitial thickening, most consistent with COPD/chronic bronchitis. Aortic Atherosclerosis (ICD10-I70.0). Electronically Signed   By: Abigail Miyamoto M.D.   On: 12/21/2017 23:35   Ct Chest Wo Contrast  Result Date: 12/22/2017 CLINICAL DATA:  57 year old male with chest pain and shortness of breath. EXAM: CT CHEST WITHOUT CONTRAST TECHNIQUE: Multidetector CT imaging of the chest was performed following the standard protocol without IV contrast. COMPARISON:  Chest radiograph dated 12/21/2017 and CT dated 04/22/2017 FINDINGS: Evaluation of this exam is limited in the absence of intravenous contrast. Cardiovascular: Top-normal cardiac size. No pericardial effusion. Advanced  multi vessel coronary vascular calcification. There is mild atherosclerotic calcification of the thoracic aorta. The aorta and central pulmonary arteries are otherwise grossly unremarkable on this noncontrast CT. Mediastinum/Nodes: No hilar or mediastinal adenopathy. Esophagus and the thyroid gland are grossly unremarkable. No mediastinal fluid collection. Lungs/Pleura: Bilateral ground-glass nodular densities most consistent with pneumonia, likely atypical in etiology. Clinical correlation and follow-up to resolution recommended. No focal consolidation, pleural effusion, or pneumothorax. Upper Abdomen: No acute abnormality. Musculoskeletal: Degenerative changes of the spine. No acute osseous pathology. IMPRESSION: 1. Innumerable small ground-glass pulmonary nodules most consistent with an inflammatory/infectious etiology. Clinical correlation and follow-up to resolution recommended. 2. Advanced multi vessel coronary vascular calcification. 3.  Aortic Atherosclerosis (ICD10-I70.0). Electronically Signed   By: Anner Crete M.D.   On: 12/22/2017 05:37   Mr Foot Right Wo Contrast  Result Date: 01/03/2018 CLINICAL DATA:  Large open wound near the base of the fifth metatarsal. EXAM: MRI OF THE RIGHT FOREFOOT WITHOUT CONTRAST TECHNIQUE: Multiplanar, multisequence MR imaging of the right foot was performed. No intravenous contrast was administered. COMPARISON:  Radiograph 01/02/2018 FINDINGS: Exam is somewhat limited by patient motion and lack of IV  contrast. There is a large open wound on the plantar and lateral aspect of the midfoot at the level of the base of the fifth metatarsal. Diffuse surrounding cellulitis but no discrete drainable soft tissue abscess. Diffuse abnormal T1 and T2 signal intensity in the cuboid, fifth metatarsal and base of the fourth metatarsal. There is also fluid in the cuboid articulation with the fourth and fifth metatarsals. Findings consistent with septic arthritis and osteomyelitis.  Mild edema like signal abnormality in the midfoot bony structures more medially likely related to advanced degenerative disease but osteomyelitis can't be totally excluded. The tibiotalar and subtalar joints are maintained. Small joint effusions. Diffuse myositis without definite findings for pyomyositis. IMPRESSION: 1. Large plantar and lateral open wound with associated significant cellulitis and myofasciitis. No discrete drainable soft tissue abscess or pyomyositis. 2. MR findings consistent with septic arthritis at the cuboid articulation with the fourth and fifth metatarsal bases and associated osteomyelitis. 3. Midfoot degenerative changes and edema like signal abnormality, likely degenerative but could not exclude osteomyelitis. Electronically Signed   By: Marijo Sanes M.D.   On: 01/03/2018 13:37   Mr Foot Right Wo Contrast  Result Date: 12/23/2017 CLINICAL DATA:  Diabetic patient with a chronic skin ulceration on the lateral aspect of the right foot at the base of the fifth metatarsal. EXAM: MRI OF THE RIGHT FOREFOOT WITHOUT CONTRAST TECHNIQUE: Multiplanar, multisequence MR imaging of the right forefoot was performed. No intravenous contrast was administered. COMPARISON:  Plain films right foot 12/22/2017 and 07/10/2016. MRI right foot 05/06/2016 FINDINGS: Bones/Joint/Cartilage Marrow edema in the cuboid is most intense in the lateral 2 cm. There is intense marrow edema throughout the fifth metatarsal. Marrow edema is also identified in the fourth metatarsal and most intense in the proximal 2 cm. Calcaneocuboid joint effusion is noted. Marrow edema about the remaining tarsometatarsal joints is likely related to neuropathic change. Subchondral cysts are seen at the articulation of the navicular and medial cuneiform. No fracture. Ligaments Intact. Muscles and Tendons Atrophy of intrinsic musculature the foot is identified. No intramuscular fluid collection. Soft tissues A subcutaneous fluid collection at  the base of the fifth metatarsal deep to a skin ulceration measures 0.7 cm craniocaudal by 0.4 cm transverse by 1.1 cm long. IMPRESSION: Skin ulceration at the base of the fifth metatarsal with marrow edema in the lateral 2 cm of the cuboid and throughout the fifth metatarsal consistent with osteomyelitis. Marrow edema is also seen throughout almost the entire fourth metatarsal but most intense in the proximal 2 cm where it is worrisome for osteomyelitis. Edema in the remainder of the fourth metatarsal may be reactive. Calcaneocuboid joint effusion worrisome for septic joint. Small fluid collection lateral to the base of the fifth metatarsal is likely an abscess. Charcot change of the midfoot. Electronically Signed   By: Inge Rise M.D.   On: 12/23/2017 12:23   Dg Chest Port 1 View  Result Date: 01/03/2018 CLINICAL DATA:  Acute shortness of breath for 2 days. EXAM: PORTABLE CHEST 1 VIEW COMPARISON:  01/02/2018 FINDINGS: Cardiomegaly and pulmonary vascular congestion again noted. Interstitial prominence is unchanged. There is no evidence of focal airspace disease, pulmonary edema, suspicious pulmonary nodule/mass, pleural effusion, or pneumothorax. No acute bony abnormalities are identified. IMPRESSION: Cardiomegaly with pulmonary vascular congestion. Interstitial prominence may represent mild interstitial edema. Electronically Signed   By: Margarette Canada M.D.   On: 01/03/2018 15:46   Dg Foot Complete Right  Result Date: 01/02/2018 CLINICAL DATA:  Diabetic ulcer at the base  of the fifth metatarsal bone. EXAM: RIGHT FOOT COMPLETE - 3+ VIEW COMPARISON:  Right foot radiograph 12/22/2017. MRI right foot 12/23/2017 FINDINGS: Large soft tissue ulceration over the plantar surface of the lateral right foot at the level of the fifth metatarsal base. This was present on the previous study. In the interval, there is development of soft tissue gas along the dorsum of the right foot. This likely reflects progression of  infection with gas-forming organism. No definite radiographic changes to suggest osteomyelitis, but changes of osteomyelitis were demonstrated in the fourth and fifth metatarsal bones on the previous MRI. Degenerative changes in the interphalangeal and intertarsal joints. Plantar calcaneal spur. No radiopaque soft tissue foreign bodies. IMPRESSION: Large soft tissue ulceration over the plantar surface of the right foot at the level of the fifth metatarsal base as seen previously. New soft tissue gas along the dorsum of the right foot likely representing progression of infection with gas-forming organism. No radiographic evidence of osteomyelitis, but previous MRI showed evidence of osteomyelitis in the fourth and fifth metatarsal bones. Electronically Signed   By: Lucienne Capers M.D.   On: 01/02/2018 22:46   Dg Foot Complete Right  Result Date: 12/22/2017 CLINICAL DATA:  57 year old male with diabetic ulcer on the plantar right foot. EXAM: RIGHT FOOT COMPLETE - 3+ VIEW COMPARISON:  Right foot radiograph dated 07/10/2016 FINDINGS: There is no acute fracture or dislocation. Degenerative changes of the midfoot primarily involving the third tarsometatarsal joint. No periosteal reaction or erosive changes to suggest acute osteomyelitis. There is a large ulcer of the lateral aspect of the plantar soft tissues of the midfoot over the base of the fifth metatarsal which appears larger compared to the prior radiograph. There is diffuse soft tissue edema. No radiopaque foreign object. IMPRESSION: 1. Large soft tissue ulcer at the lateral plantar midfoot. 2. No acute fracture or dislocation. No radiographic evidence of acute osteomyelitis. MRI or a WBC nuclear scan may provide better evaluation if there is high clinical concern for acute osteomyelitis. Electronically Signed   By: Anner Crete M.D.   On: 12/22/2017 05:42     CBC Recent Labs  Lab 01/02/18 2153 01/03/18 0453 01/04/18 0031  WBC 20.6* 17.7* 17.6*   HGB 9.0* 8.7* 8.8*  HCT 26.7* 25.1* 26.0*  PLT 290 278 271  MCV 83.9 83.8 83.3  MCH 28.1 28.9 28.0  MCHC 33.5 34.5 33.6  RDW 18.4* 18.0* 18.0*  LYMPHSABS 0.8*  --   --   MONOABS 1.5*  --   --   EOSABS 0.0  --   --   BASOSABS 0.0  --   --     Chemistries  Recent Labs  Lab 01/02/18 2153 01/03/18 0453 01/04/18 0031  NA 125* 129* 127*  K 5.4* 4.4 5.2*  CL 86* 92* 93*  CO2 30 29 27   GLUCOSE 472* 360* 364*  BUN 33* 34* 39*  CREATININE 2.02* 1.84* 1.48*  CALCIUM 8.5* 8.2* 8.5*  AST 62*  --   --   ALT 26  --   --   ALKPHOS 60  --   --   BILITOT 1.1  --   --    ------------------------------------------------------------------------------------------------------------------ estimated creatinine clearance is 64.9 mL/min (A) (by C-G formula based on SCr of 1.48 mg/dL (H)). ------------------------------------------------------------------------------------------------------------------ No results for input(s): HGBA1C in the last 72 hours. ------------------------------------------------------------------------------------------------------------------ No results for input(s): CHOL, HDL, LDLCALC, TRIG, CHOLHDL, LDLDIRECT in the last 72 hours. ------------------------------------------------------------------------------------------------------------------ No results for input(s): TSH, T4TOTAL, T3FREE, THYROIDAB in the  last 72 hours.  Invalid input(s): FREET3 ------------------------------------------------------------------------------------------------------------------ No results for input(s): VITAMINB12, FOLATE, FERRITIN, TIBC, IRON, RETICCTPCT in the last 72 hours.  Coagulation profile Recent Labs  Lab 01/02/18 2153  INR 1.21    No results for input(s): DDIMER in the last 72 hours.  Cardiac Enzymes Recent Labs  Lab 01/03/18 1026 01/03/18 1654 01/04/18 0031  TROPONINI 1.95* 2.49* 2.17*    ------------------------------------------------------------------------------------------------------------------ Invalid input(s): POCBNP    Assessment & Plan  Patient's 57 year old with left foot wound recent admission for sepsis related to pneumonia and foot infection   #1  Sepsis ([place]) due to foot infection continue broad-spectrum antibiotics Appreciate podiatry input status post excision of the fifth metatarsal and fourth metatarsal base with incision and drainage of the dorsal right midfoot which surgical cultures   #2 Coronary artery disease -with possible non-ST MI felt to be due to stress related to his sepsis patient has three-vessel disease and awaiting improvement from his medical conditions prior to CABG Discontinue IV heparin  #3  Diabetic foot infection ([place]) status post debridement as above, Likely peripheral vascular disease however due to his coronary artery disease likely not a good candidate for surgical intervention currently  #4  AKI (acute kidney injury) ([place]) -improved continue to monitor  #5  Hypertension -hold antihypertensives for now as the patient's blood pressure is borderline low   #6 chronic systolic CHF (congestive heart failure) ([place]) -discontinue IV fluid  #7  Diabetes ([place]) -continue Lantus I will increase the dose blood sugars are poor control    #8 hyperlipidemia -Home dose antilipid       Code Status Orders  (From admission, onward)        Start     Ordered   01/03/18 0409  Full code  Continuous     01/03/18 0408    Code Status History    Date Active Date Inactive Code Status Order ID Comments User Context   12/22/2017 0607 12/23/2017 1916 Full Code [id number]  Harrie Foreman, MD Inpatient   11/21/2017 0415 11/23/2017 1444 Full Code [id number]  Lance Coon, MD Inpatient   04/23/2017 0355 04/29/2017 1353 Full Code [id number]  Saundra Shelling, MD Inpatient   07/11/2016 0324 07/11/2016 1851 Full Code [id number]  Saundra Shelling, MD  Inpatient   05/06/2016 0005 05/07/2016 1417 Full Code [id number]  Harvie Bridge, DO Inpatient   11/21/2015 1716 11/22/2015 1950 Full Code [id number]  Gladstone Lighter, MD Inpatient   07/06/2015 0948 07/06/2015 1437 Full Code [id number]  Algernon Huxley, MD Inpatient           Consults podiatry, cardiology   DVT Prophylaxis  Lovenox   Lab Results  Component Value Date   PLT 271 01/04/2018     Time Spent in minutes 45min Greater than 50% of time spent in care coordination and counseling patient regarding the condition and plan of care.   Dustin Flock M.D on 01/04/2018 at 3:12 PM  Between 7am to 6pm - Pager - [phone]  After 6pm go to [email or web address] - Proofreader  [employer]   Office  [phone]

## 2018-01-04 NOTE — Anesthesia Postprocedure Evaluation (Signed)
Anesthesia Post Note  Patient: [name] [name]  Procedure(s) Performed: IRRIGATION AND DEBRIDEMENT FOOT (Right )  Patient location during evaluation: PACU Anesthesia Type: General Level of consciousness: awake and alert Pain management: pain level controlled Vital Signs Assessment: post-procedure vital signs reviewed and stable Respiratory status: spontaneous breathing, nonlabored ventilation, respiratory function stable and patient connected to nasal cannula oxygen Cardiovascular status: blood pressure returned to baseline and stable Postop Assessment: no apparent nausea or vomiting Anesthetic complications: no     Last Vitals:  Vitals:   01/04/18 1319 01/04/18 1511  BP:  117/67  Pulse:  93  Resp:  19  Temp:  36.6 C  SpO2: 98% 94%    Last Pain:  Vitals:   01/04/18 1645  TempSrc:   PainSc: 7                  Martha Clan

## 2018-01-04 NOTE — Progress Notes (Signed)
Pharmacy Antibiotic Note  [name] [name] is a 57 y.o. male admitted on 01/02/2018 with sepsis.  Pharmacy has been consulted for vancomycin and cefepime dosing.  Plan: Undated Kinetics: Ke: 0.058   Vd: 56.88   T1/2: 11.95  Will adjust dose to Vancomycin 1 gram q 12 hours.  Check Vanc trough prior to 4th dose. Goal trough 15-20.  Adjust dose to Cefepime 2 grams q 8 hours ordered.  Height: 5\' 7"  (170.2 cm) Weight: 229 lb 3.2 oz (104 kg) IBW/kg (Calculated) : 66.1  Temp (24hrs), Avg:98.4 F (36.9 C), Min:97.7 F (36.5 C), Max:99.9 F (37.7 C)  Recent Labs  Lab 01/02/18 2153 01/03/18 0025 01/03/18 0453 01/04/18 0031 01/04/18 0115  WBC 20.6*  --  17.7* 17.6*  --   CREATININE 2.02*  --  1.84* 1.48*  --   LATICACIDVEN 2.3* 1.3  --   --  1.2    Estimated Creatinine Clearance: 64.9 mL/min (A) (by C-G formula based on SCr of 1.48 mg/dL (H)).    Allergies  Allergen Reactions  . Dulaglutide Anaphylaxis, Diarrhea and Hives  . Prednisone Rash    Antimicrobials this admission: 7/26 Vancomycin >> 7/26 cefepime  >>   Microbiology results: 7/26 BCx: Group B Strep 7/26 UCx: pending   7/26 CXR: no active disease 7/26 UA: pending draw  Thank you for allowing pharmacy to be a part of this patient's care.  Pernell Dupre, PharmD, BCPS Clinical Pharmacist 01/04/2018 1:19 PM

## 2018-01-04 NOTE — Anesthesia Post-op Follow-up Note (Signed)
Anesthesia QCDR form completed.        

## 2018-01-05 ENCOUNTER — Encounter: Payer: Self-pay | Admitting: *Deleted

## 2018-01-05 DIAGNOSIS — A419 Sepsis, unspecified organism: Secondary | ICD-10-CM

## 2018-01-05 DIAGNOSIS — E11628 Type 2 diabetes mellitus with other skin complications: Secondary | ICD-10-CM

## 2018-01-05 DIAGNOSIS — I739 Peripheral vascular disease, unspecified: Secondary | ICD-10-CM

## 2018-01-05 DIAGNOSIS — Z794 Long term (current) use of insulin: Secondary | ICD-10-CM

## 2018-01-05 DIAGNOSIS — L97509 Non-pressure chronic ulcer of other part of unspecified foot with unspecified severity: Secondary | ICD-10-CM

## 2018-01-05 LAB — CBC
HCT: 29.4 % — ABNORMAL LOW (ref 40.0–52.0)
Hemoglobin: 10 g/dL — ABNORMAL LOW (ref 13.0–18.0)
MCH: 28.4 pg (ref 26.0–34.0)
MCHC: 34.2 g/dL (ref 32.0–36.0)
MCV: 83.1 fL (ref 80.0–100.0)
Platelets: 316 10*3/uL (ref 150–440)
RBC: 3.53 MIL/uL — ABNORMAL LOW (ref 4.40–5.90)
RDW: 18.4 % — ABNORMAL HIGH (ref 11.5–14.5)
WBC: 11.6 10*3/uL — ABNORMAL HIGH (ref 3.8–10.6)

## 2018-01-05 LAB — URINALYSIS, COMPLETE (UACMP) WITH MICROSCOPIC
Bacteria, UA: NONE SEEN
Bilirubin Urine: NEGATIVE
Glucose, UA: >=500 mg/dL — AB
Hgb urine dipstick: NEGATIVE
Ketones, ur: NEGATIVE mg/dL
Leukocytes, UA: NEGATIVE
Nitrite: NEGATIVE
Protein, ur: NEGATIVE mg/dL
Specific Gravity, Urine: 1.016 (ref 1.005–1.030)
Squamous Epithelial / LPF: NONE SEEN (ref 0–5)
pH: 5 (ref 5.0–8.0)

## 2018-01-05 LAB — BASIC METABOLIC PANEL
Anion gap: 6 (ref 5–15)
BUN: 54 mg/dL — ABNORMAL HIGH (ref 6–20)
CO2: 29 mmol/L (ref 22–32)
Calcium: 8.5 mg/dL — ABNORMAL LOW (ref 8.9–10.3)
Chloride: 98 mmol/L (ref 98–111)
Creatinine, Ser: 1.46 mg/dL — ABNORMAL HIGH (ref 0.61–1.24)
GFR calc Af Amer: >60 mL/min (ref >60)
GFR calc non Af Amer: 52 mL/min — ABNORMAL LOW (ref >60)
Glucose, Bld: 363 mg/dL — ABNORMAL HIGH (ref 70–99)
Potassium: 4.7 mmol/L (ref 3.5–5.1)
Sodium: 133 mmol/L — ABNORMAL LOW (ref 135–145)

## 2018-01-05 LAB — GLUCOSE, CAPILLARY
Glucose-Capillary: 319 mg/dL — ABNORMAL HIGH (ref 70–99)
Glucose-Capillary: 417 mg/dL — ABNORMAL HIGH (ref 70–99)
Glucose-Capillary: 486 mg/dL — ABNORMAL HIGH (ref 70–99)
Glucose-Capillary: 468 mg/dL — ABNORMAL HIGH (ref 70–99)
Glucose-Capillary: 375 mg/dL — ABNORMAL HIGH (ref 70–99)
Glucose-Capillary: 443 mg/dL — ABNORMAL HIGH (ref 70–99)

## 2018-01-05 LAB — CULTURE, BLOOD (ROUTINE X 2)

## 2018-01-05 LAB — VANCOMYCIN, TROUGH: Vancomycin Tr: 19 ug/mL (ref 15–20)

## 2018-01-05 MED ORDER — ENOXAPARIN SODIUM 40 MG/0.4ML ~~LOC~~ SOLN
40.0000 mg | SUBCUTANEOUS | Status: DC
  Administered 2018-01-05 – 2018-01-08 (×4): 40 mg via SUBCUTANEOUS
  Filled 2018-01-05 (×4): qty 0.4

## 2018-01-05 MED ORDER — INSULIN ASPART 100 UNIT/ML ~~LOC~~ SOLN
6.0000 [IU] | Freq: Three times a day (TID) | SUBCUTANEOUS | Status: DC
  Administered 2018-01-05 – 2018-01-09 (×9): 6 [IU] via SUBCUTANEOUS
  Filled 2018-01-05 (×10): qty 1

## 2018-01-05 MED ORDER — MORPHINE SULFATE (PF) 2 MG/ML IV SOLN
2.0000 mg | Freq: Four times a day (QID) | INTRAVENOUS | Status: DC | PRN
  Administered 2018-01-05 – 2018-01-08 (×6): 2 mg via INTRAVENOUS
  Filled 2018-01-05 (×6): qty 1

## 2018-01-05 MED ORDER — INSULIN ASPART 100 UNIT/ML ~~LOC~~ SOLN
20.0000 [IU] | Freq: Once | SUBCUTANEOUS | Status: AC
  Administered 2018-01-05: 20 [IU] via SUBCUTANEOUS
  Filled 2018-01-05: qty 1

## 2018-01-05 MED ORDER — SODIUM CHLORIDE 0.9 % IV SOLN
INTRAVENOUS | Status: DC
  Administered 2018-01-06 – 2018-01-07 (×2): via INTRAVENOUS

## 2018-01-05 MED ORDER — JUVEN PO PACK
1.0000 | PACK | Freq: Two times a day (BID) | ORAL | Status: DC
  Administered 2018-01-05 – 2018-01-07 (×2): 1 via ORAL

## 2018-01-05 MED ORDER — PREMIER PROTEIN SHAKE
11.0000 [oz_av] | Freq: Two times a day (BID) | ORAL | Status: DC
  Administered 2018-01-05 – 2018-01-09 (×5): 11 [oz_av] via ORAL

## 2018-01-05 MED ORDER — SODIUM CHLORIDE 0.9 % IV SOLN
2.0000 g | INTRAVENOUS | Status: DC
  Administered 2018-01-05 – 2018-01-09 (×23): 2 g via INTRAVENOUS
  Filled 2018-01-05 (×4): qty 2
  Filled 2018-01-05: qty 2000
  Filled 2018-01-05: qty 2
  Filled 2018-01-05: qty 2000
  Filled 2018-01-05 (×5): qty 2
  Filled 2018-01-05: qty 2000
  Filled 2018-01-05: qty 2
  Filled 2018-01-05: qty 2000
  Filled 2018-01-05 (×2): qty 2
  Filled 2018-01-05: qty 2000
  Filled 2018-01-05 (×5): qty 2
  Filled 2018-01-05 (×4): qty 2000
  Filled 2018-01-05: qty 2

## 2018-01-05 MED ORDER — SODIUM CHLORIDE 0.9 % IV SOLN
1.0000 g | Freq: Four times a day (QID) | INTRAVENOUS | Status: DC
  Filled 2018-01-05 (×4): qty 1000

## 2018-01-05 MED ORDER — OCUVITE-LUTEIN PO CAPS
1.0000 | ORAL_CAPSULE | Freq: Every day | ORAL | Status: DC
  Administered 2018-01-05 – 2018-01-09 (×4): 1 via ORAL
  Filled 2018-01-05 (×5): qty 1

## 2018-01-05 NOTE — Progress Notes (Addendum)
Progress Note  Patient Name: [name] [name] Date of Encounter: 01/05/2018  Primary Cardiologist: Dr Rich Fuchs -[facility]  Subjective   No chest pain- oriented  Inpatient Medications    Scheduled Meds: . aspirin EC  81 mg Oral Daily  . atorvastatin  80 mg Oral Daily  . carvedilol  6.25 mg Oral BID  . clopidogrel  75 mg Oral Daily  . gabapentin  400 mg Oral BID  . insulin aspart  0-20 Units Subcutaneous TID AC & HS  . insulin aspart  6 Units Subcutaneous TID WC  . insulin glargine  60 Units Subcutaneous QHS  . ipratropium-albuterol  3 mL Nebulization Q6H  . methylPREDNISolone (SOLU-MEDROL) injection  60 mg Intravenous Q6H  . multivitamin-lutein  1 capsule Oral Daily  . nicotine  21 mg Transdermal Daily  . nutrition supplement (JUVEN)  1 packet Oral BID BM  . protein supplement shake  11 oz Oral BID BM  . traZODone  50 mg Oral QHS   Continuous Infusions: . ceFEPime (MAXIPIME) IV Stopped (01/05/18 7672)  . vancomycin Stopped (01/05/18 0341)   PRN Meds: acetaminophen **OR** acetaminophen, ALPRAZolam, diphenhydrAMINE, ondansetron **OR** ondansetron (ZOFRAN) IV, oxyCODONE   Vital Signs    Vitals:   01/04/18 2107 01/05/18 0502 01/05/18 0733 01/05/18 0805  BP:  138/86  129/81  Pulse:  90  91  Resp:  17  20  Temp:    97.7 F (36.5 C)  TempSrc:    Oral  SpO2: 94% 98% 96% 94%  Weight:      Height:        Intake/Output Summary (Last 24 hours) at 01/05/2018 1402 Last data filed at 01/05/2018 1136 Gross per 24 hour  Intake 1532 ml  Output 1710 ml  Net -178 ml   Filed Weights   01/02/18 2213 01/03/18 0337 01/04/18 0254  Weight: 237 lb (107.5 kg) 231 lb 14.4 oz (105.2 kg) 229 lb 3.2 oz (104 kg)    Telemetry    NSR, PACs - Personally Reviewed  ECG     01/04/18- NSR, ST, RBBB, LAFB- Personally Reviewed  Physical Exam   GEN: overweight male, No acute distress.   Neck: No JVD, LCA bruit Cardiac: RRR, no murmurs, rubs, or gallops.  Respiratory: Clear to auscultation  bilaterally. GI: Soft, nontender, non-distended  MS: dressing and drain in placed Rt foot Neuro:  Nonfocal  Psych: Normal affect   Labs    Chemistry Recent Labs  Lab 01/02/18 2153 01/03/18 0453 01/04/18 0031 01/05/18 0413  NA 125* 129* 127* 133*  K 5.4* 4.4 5.2* 4.7  CL 86* 92* 93* 98  CO2 30 29 27 29   GLUCOSE 472* 360* 364* 363*  BUN 33* 34* 39* 54*  CREATININE 2.02* 1.84* 1.48* 1.46*  CALCIUM 8.5* 8.2* 8.5* 8.5*  PROT 7.1  --   --   --   ALBUMIN 2.6*  --   --   --   AST 62*  --   --   --   ALT 26  --   --   --   ALKPHOS 60  --   --   --   BILITOT 1.1  --   --   --   GFRNONAA 35* 40* 52* 52*  GFRAA 41* 46* 60* >60  ANIONGAP 9 8 7 6      Hematology Recent Labs  Lab 01/03/18 0453 01/04/18 0031 01/05/18 0413  WBC 17.7* 17.6* 11.6*  RBC 3.00* 3.13* 3.53*  HGB 8.7* 8.8* 10.0*  HCT  25.1* 26.0* 29.4*  MCV 83.8 83.3 83.1  MCH 28.9 28.0 28.4  MCHC 34.5 33.6 34.2  RDW 18.0* 18.0* 18.4*  PLT 278 271 316    Cardiac Enzymes Recent Labs  Lab 01/03/18 0453 01/03/18 1026 01/03/18 1654 01/04/18 0031  TROPONINI 3.09* 1.95* 2.49* 2.17*   No results for input(s): TROPIPOC in the last 168 hours.   BNPNo results for input(s): BNP, PROBNP in the last 168 hours.   DDimer No results for input(s): DDIMER in the last 168 hours.   Radiology    Dg Chest Port 1 View  Result Date: 01/03/2018 CLINICAL DATA:  Acute shortness of breath for 2 days. EXAM: PORTABLE CHEST 1 VIEW COMPARISON:  01/02/2018 FINDINGS: Cardiomegaly and pulmonary vascular congestion again noted. Interstitial prominence is unchanged. There is no evidence of focal airspace disease, pulmonary edema, suspicious pulmonary nodule/mass, pleural effusion, or pneumothorax. No acute bony abnormalities are identified. IMPRESSION: Cardiomegaly with pulmonary vascular congestion. Interstitial prominence may represent mild interstitial edema. Electronically Signed   By: Margarette Canada M.D.   On: 01/03/2018 15:46    Cardiac  Studies   Echo 11/21/17- EF 25-30%  Patient Profile     57 y.o. male Caucasian male with a history of CAD with known 3V CAD at cath Nov 2018 and the recommendation was for CVTS evaluation. The pateint went to [facility] fo a second opinion and is being followed by Dr Rich Fuchs. CABG is being considered if the pt can quit smoking (currently 2 PPD) and his PAD stabilizes. 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 he was to have a f/u echo in August. He has chronic LE claudication and known PVD s/p prior intervention. He has a chronic Rt foot ulcer and was admitted 01/03/18 with sepsis. He ruled in for a NSTEMI- Troponin 3.0.   Assessment & Plan     NSTEMI- medical Rx for now  CAD- know 3 V CAD by cath Nov 2018-poor candidate for PCI and CABG-follow[facility] at [facility]   ICM- EF 25%, careful with IV hydration  Sepsis- secondary to Rt foot ulcer (chronic), s/p I&D  PVD- known PVD with prior LEA PTA at [facility]  CRI- stage 3  Smoker- 2 ppd  IDDM- per primary service   Plan: On ASA, Coreg, statin, Lipitor, and Plavix. Consider resuming home dose of Lasix 40 mg soon.         For questions or updates, please contact [place] Please consult [email or web address] for contact info under Cardiology/STEMI.      Signed, Kerin Ransom, PA-C  01/05/2018, 2:02 PM     Attending Note Patient seen and examined, agree with detailed note above,  Patient presentation and plan discussed on rounds.   Sitting up in bed, bedbound Bandaged wound VAC in place with drain to suction Denies any significant chest pain or shortness of breath Does not know what the plan is for his foot Was told by 1 of the doctors he needs to stay in bed until foot is healed  On physical examination no JVD lungs clear to auscultation bilaterally heart sounds regular normal S1-S2 no murmurs appreciated abdomen soft nontender no significant lower extremity edema  Lab work reviewed showing creatinine 1.46 BUN 54 potassium  4.7 hematocrit 29  A/P: Demand ischemia In the setting of severe underlying coronary disease Currently not a good candidate for CABG or PCI Seen by [facility] cardiology Would continue medical management with aspirin Plavix statin beta-blocker  Sepsis Right foot ulcer recent irrigation and debridement  In a wrap with wound to suction wound VAC details unclear  CAD Stressed importance of smoking cessation  aggressive lipid control   Greater than 50% was spent in counseling and coordination of care with patient Total encounter time 25 minutes or more   Signed: Esmond Plants  M.D., Ph.D. [facility]

## 2018-01-05 NOTE — Progress Notes (Addendum)
Initial Nutrition Assessment  DOCUMENTATION CODES:   Obesity unspecified  INTERVENTION:   Premier Protein BID, each supplement provides 160 kcal and 30 grams of protein.   Juven Fruit Punch BID, each serving provides 95kcal and 2.5g of protein (amino acids glutamine and arginine)  Ocuvite daily for wound healing (provides zinc, vitamin A, vitamin C, Vitamin E, copper, and selenium)  Dysphagia 3 diet  NUTRITION DIAGNOSIS:   Increased nutrient needs related to wound healing as evidenced by increased estimated needs.  GOAL:   Patient will meet greater than or equal to 90% of their needs  MONITOR:   PO intake, Supplement acceptance, Labs, Weight trends, Skin, I & O's  REASON FOR ASSESSMENT:   Consult Wound healing  ASSESSMENT:    57 yo male with a history of ischemic cardiomyopathy, CAD, chronic systolic CHF, PAD, HTN, COPD, DM and tobacco abuse admitted with possible sepsis 2/2 diabetic foot ulcer .    -s/p I & D 7/28- VAC placement   Met with pt in room today. Pt reports good appetite today and pta. Pt currently eating 100% of meals in hospital. Pt does not drink any supplements at home but is willing to drink "whatever we want to send" here. RD discussed with pt today the importance of adequate protein and micronutrient intake needed for wound healing. Per chart, pt is weight stable. RD will order vitamins and supplements to help pt meet his estimated protein needs. Pt requesting mechanical soft diet as he does not have any dentures.    Medications reviewed and include: aspirin, plavix, insulin, solu-medrol, ocuvite, nicotine, cefepime, vancomycin, oxycodone  Labs reviewed: Na 133(L), BUN 54(H), creat 1.46(H),  Wbc- 11.6(H), Hgb 10.0(L), Hct 29.4(L) cbgs- 319, 417, 486, 468 x 24 hrs AIC 8.5(H)- 6/14  NUTRITION - FOCUSED PHYSICAL EXAM:    Most Recent Value  Orbital Region  No depletion  Upper Arm Region  No depletion  Thoracic and Lumbar Region  No depletion   Buccal Region  No depletion  Temple Region  No depletion  Clavicle Bone Region  No depletion  Clavicle and Acromion Bone Region  No depletion  Scapular Bone Region  No depletion  Dorsal Hand  No depletion  Patellar Region  No depletion  Anterior Thigh Region  No depletion  Posterior Calf Region  No depletion  Edema (RD Assessment)  Moderate  Hair  Reviewed  Eyes  Reviewed  Mouth  Reviewed  Skin  Reviewed  Nails  Reviewed     Diet Order:   Diet Order           DIET DYS 3 Room service appropriate? Yes; Fluid consistency: Thin  Diet effective now         EDUCATION NEEDS:   Education needs have been addressed  Skin:  Skin Assessment: Reviewed RN Assessment(R diabetic foot ulcer ), VAC   Last BM:  7/28  Height:   Ht Readings from Last 1 Encounters:  01/02/18 '5\' 7"'$  (1.702 m)    Weight:   Wt Readings from Last 1 Encounters:  01/04/18 229 lb 3.2 oz (104 kg)    Ideal Body Weight:  67.2 kg  BMI:  Body mass index is 35.9 kg/m.  Estimated Nutritional Needs:   Kcal:  2000-2300kcal/day   Protein:  104-115g/day   Fluid:  >2L/day   Koleen Distance MS, RD, LDN Pager #- [phone] Office#- [phone] After Hours Pager: [phone]

## 2018-01-05 NOTE — Progress Notes (Signed)
[facility] at [facility]                                                                                                                                                                                  Patient Demographics   [name] [name], is a 57 y.o. male, DOB - [birth year], KVQ:[id number]  Admit date - 01/02/2018   Admitting Physician Lance Coon, MD  Outpatient Primary MD for the patient is Clarisse Gouge, MD   LOS - 3  Subjective: Patient tried to leave AMA against advice yesterday but now doing better   Review of Systems:   CONSTITUTIONAL: No documented fever. No fatigue, weakness. No weight gain, no weight loss.  EYES: No blurry or double vision.  ENT: No tinnitus. No postnasal drip. No redness of the oropharynx.  RESPIRATORY: No cough, no wheeze, no hemoptysis. No dyspnea.  CARDIOVASCULAR: No chest pain. No orthopnea. No palpitations. No syncope.  GASTROINTESTINAL: No nausea, no vomiting or diarrhea. No abdominal pain. No melena or hematochezia.  GENITOURINARY: No dysuria or hematuria.  ENDOCRINE: No polyuria or nocturia. No heat or cold intolerance.  HEMATOLOGY: No anemia. No bruising. No bleeding.  INTEGUMENTARY: No rashes. No lesions.  MUSCULOSKELETAL: Positive foot pain NEUROLOGIC: No numbness, tingling, or ataxia. No seizure-type activity.  PSYCHIATRIC: No anxiety. No insomnia. No ADD.    Vitals:   Vitals:   01/04/18 2107 01/05/18 0502 01/05/18 0733 01/05/18 0805  BP:  138/86  129/81  Pulse:  90  91  Resp:  17  20  Temp:    97.7 F (36.5 C)  TempSrc:    Oral  SpO2: 94% 98% 96% 94%  Weight:      Height:        Wt Readings from Last 3 Encounters:  01/04/18 104 kg (229 lb 3.2 oz)  12/23/17 105.9 kg (233 lb 7.5 oz)  11/23/17 104.5 kg (230 lb 4.8 oz)     Intake/Output Summary (Last 24 hours) at 01/05/2018 1446 Last data filed at 01/05/2018 1409 Gross per 24 hour  Intake 1772 ml  Output 1710 ml  Net 62 ml    Physical Exam:    GENERAL: Pleasant-appearing in no apparent distress.  HEAD, EYES, EARS, NOSE AND THROAT: Atraumatic, normocephalic. Extraocular muscles are intact. Pupils equal and reactive to light. Sclerae anicteric. No conjunctival injection. No oro-pharyngeal erythema.  NECK: Supple. There is no jugular venous distention. No bruits, no lymphadenopathy, no thyromegaly.  HEART: Regular rate and rhythm,. No murmurs, no rubs, no clicks.  LUNGS: Clear to auscultation bilaterally. No rales or rhonchi. No wheezes.  ABDOMEN: Soft, flat, nontender, nondistended. Has good bowel sounds. No hepatosplenomegaly appreciated.  EXTREMITIES: He has dressing in place on the right foot NEUROLOGIC: The patient is alert, awake, and oriented x3 with no focal motor or sensory deficits appreciated bilaterally.  SKIN: Moist and warm with no rashes appreciated.  Psych: Not anxious, depressed LN: No inguinal LN enlargement    Antibiotics   Anti-infectives (From admission, onward)   Start     Dose/Rate Route Frequency Ordered Stop   01/04/18 2200  ceFEPIme (MAXIPIME) 2 g in sodium chloride 0.9 % 100 mL IVPB     2 g 200 mL/hr over 30 Minutes Intravenous Every 8 hours 01/04/18 1321     01/04/18 1400  vancomycin (VANCOCIN) IVPB 1000 mg/200 mL premix     1,000 mg 200 mL/hr over 60 Minutes Intravenous Every 12 hours 01/04/18 1318     01/03/18 1000  ceFEPIme (MAXIPIME) 2 g in sodium chloride 0.9 % 100 mL IVPB  Status:  Discontinued     2 g 200 mL/hr over 30 Minutes Intravenous Every 12 hours 01/03/18 0052 01/04/18 1321   01/03/18 0600  vancomycin (VANCOCIN) IVPB 1000 mg/200 mL premix  Status:  Discontinued     1,000 mg 200 mL/hr over 60 Minutes Intravenous Every 18 hours 01/03/18 0052 01/04/18 1318   01/02/18 2230  metroNIDAZOLE (FLAGYL) IVPB 500 mg     500 mg 100 mL/hr over 60 Minutes Intravenous  Once 01/02/18 2227 01/03/18 0032   01/02/18 2145  ceFEPIme (MAXIPIME) 2 g in sodium chloride 0.9 % 100 mL IVPB     2 g 200 mL/hr  over 30 Minutes Intravenous  Once 01/02/18 2133 01/02/18 2310   01/02/18 2145  vancomycin (VANCOCIN) IVPB 1000 mg/200 mL premix     1,000 mg 200 mL/hr over 60 Minutes Intravenous  Once 01/02/18 2133 01/02/18 2335      Medications   Scheduled Meds: . aspirin EC  81 mg Oral Daily  . atorvastatin  80 mg Oral Daily  . carvedilol  6.25 mg Oral BID  . clopidogrel  75 mg Oral Daily  . gabapentin  400 mg Oral BID  . insulin aspart  0-20 Units Subcutaneous TID AC & HS  . insulin aspart  6 Units Subcutaneous TID WC  . insulin glargine  60 Units Subcutaneous QHS  . ipratropium-albuterol  3 mL Nebulization Q6H  . methylPREDNISolone (SOLU-MEDROL) injection  60 mg Intravenous Q6H  . multivitamin-lutein  1 capsule Oral Daily  . nicotine  21 mg Transdermal Daily  . nutrition supplement (JUVEN)  1 packet Oral BID BM  . protein supplement shake  11 oz Oral BID BM  . traZODone  50 mg Oral QHS   Continuous Infusions: . ceFEPime (MAXIPIME) IV Stopped (01/05/18 9381)  . vancomycin Stopped (01/05/18 0341)   PRN Meds:.acetaminophen **OR** acetaminophen, ALPRAZolam, diphenhydrAMINE, ondansetron **OR** ondansetron (ZOFRAN) IV, oxyCODONE   Data Review:   Micro Results Recent Results (from the past 240 hour(s))  Blood Culture (routine x 2)     Status: Abnormal (Preliminary result)   Collection Time: 01/02/18  9:53 PM  Result Value Ref Range Status   Specimen Description   Final    BLOOD LEFT WRIST Performed at [facility], [address], [place], [place] [address]    Special Requests   Final    BOTTLES DRAWN AEROBIC AND ANAEROBIC Blood Culture results may not be optimal due to an inadequate volume of blood received in culture bottles Performed at [facility], [address], [place], [place] [address]    Culture  Setup Time   Final    GRAM POSITIVE COCCI ANAEROBIC BOTTLE ONLY CRITICAL VALUE NOTED.  VALUE IS CONSISTENT WITH PREVIOUSLY REPORTED AND CALLED VALUE. Performed  at [facility], [address] [place], [place] [address]    Culture VIRIDANS STREPTOCOCCUS (A)  Final   Report Status PENDING  Incomplete  Blood Culture (routine x 2)     Status: Abnormal   Collection Time: 01/02/18  9:53 PM  Result Value Ref Range Status   Specimen Description   Final    BLOOD LEFT ANTECUBITAL Performed at [facility], [address] [place], [place] [address]    Special Requests   Final    BOTTLES DRAWN AEROBIC AND ANAEROBIC Blood Culture results may not be optimal due to an inadequate volume of blood received in culture bottles Performed at [facility], [address] [place], [place] [address]    Culture  Setup Time   Final    GRAM POSITIVE COCCI IN BOTH AEROBIC AND ANAEROBIC BOTTLES CRITICAL RESULT CALLED TO, READ BACK BY AND VERIFIED WITH: Dicie Beam 01/03/18 @ 1055  [place] Performed at [facility], [place] [address], [place], [place] [address]    Culture GROUP B STREP(S.AGALACTIAE)ISOLATED (A)  Final   Report Status 01/05/2018 FINAL  Final   Organism ID, Bacteria GROUP B STREP(S.AGALACTIAE)ISOLATED  Final      Susceptibility   Group b strep(s.agalactiae)isolated - MIC*    CLINDAMYCIN >=1 RESISTANT Resistant     AMPICILLIN <=0.25 SENSITIVE Sensitive     ERYTHROMYCIN >=8 RESISTANT Resistant     VANCOMYCIN 0.5 SENSITIVE Sensitive     CEFTRIAXONE <=0.12 SENSITIVE Sensitive     LEVOFLOXACIN 1 SENSITIVE Sensitive     PENICILLIN Value in next row Sensitive      SENSITIVE0.12    * GROUP B STREP(S.AGALACTIAE)ISOLATED  Blood Culture ID Panel (Reflexed)     Status: Abnormal   Collection Time: 01/02/18  9:53 PM  Result Value Ref Range Status   Enterococcus species NOT DETECTED NOT DETECTED Final   Listeria monocytogenes NOT DETECTED NOT DETECTED Final   Staphylococcus species NOT DETECTED NOT DETECTED Final   Staphylococcus aureus NOT DETECTED NOT DETECTED Final   Streptococcus species DETECTED (A) NOT DETECTED Final     Comment: CRITICAL RESULT CALLED TO, READ BACK BY AND VERIFIED WITH: TELDRIN JAMES 01/03/18 @ 1055  MLK    Streptococcus agalactiae DETECTED (A) NOT DETECTED Final    Comment: CRITICAL RESULT CALLED TO, READ BACK BY AND VERIFIED WITH: TELDRIN JAMES 01/03/18 @ 1055  MLK    Streptococcus pneumoniae NOT DETECTED NOT DETECTED Final   Streptococcus pyogenes NOT DETECTED NOT DETECTED Final   Acinetobacter baumannii NOT DETECTED NOT DETECTED Final   Enterobacteriaceae species NOT DETECTED NOT DETECTED Final   Enterobacter cloacae complex NOT DETECTED NOT DETECTED Final   Escherichia coli NOT DETECTED NOT DETECTED Final   Klebsiella oxytoca NOT DETECTED NOT DETECTED Final   Klebsiella pneumoniae NOT DETECTED NOT DETECTED Final   Proteus species NOT DETECTED NOT DETECTED Final   Serratia marcescens NOT DETECTED NOT DETECTED Final   Haemophilus influenzae NOT DETECTED NOT DETECTED Final   Neisseria meningitidis NOT DETECTED NOT DETECTED Final   Pseudomonas aeruginosa NOT DETECTED NOT DETECTED Final   Candida albicans NOT DETECTED NOT DETECTED Final   Candida glabrata NOT DETECTED NOT DETECTED Final   Candida krusei NOT DETECTED NOT DETECTED Final   Candida parapsilosis NOT DETECTED NOT DETECTED Final   Candida tropicalis NOT  DETECTED NOT DETECTED Final    Comment: Performed at [facility], [address] [place], [place] [address]  Aerobic/Anaerobic Culture (surgical/deep wound)     Status: None (Preliminary result)   Collection Time: 01/03/18  3:49 AM  Result Value Ref Range Status   Specimen Description   Final    WOUND RIGHT FOOT Performed at [facility], [address], [place], [place] [address]    Special Requests   Final    NONE Performed at [facility], [address] [place], [place] [address]    Gram Stain   Final    RARE WBC PRESENT, PREDOMINANTLY PMN ABUNDANT GRAM POSITIVE COCCI IN PAIRS IN CLUSTERS FEW GRAM NEGATIVE RODS    Culture   Final     ABUNDANT GROUP B STREP(S.AGALACTIAE)ISOLATED TESTING AGAINST S. AGALACTIAE NOT ROUTINELY PERFORMED DUE TO PREDICTABILITY OF AMP/PEN/VAN SUSCEPTIBILITY. HOLDING FOR POSSIBLE ANAEROBE Performed at [facility], [place] [address], [place], [place] [address]    Report Status PENDING  Incomplete  Aerobic/Anaerobic Culture (surgical/deep wound)     Status: None (Preliminary result)   Collection Time: 01/04/18  8:59 AM  Result Value Ref Range Status   Specimen Description WOUND RIGHT FOOT  Final   Special Requests PATIENT ON FOLLOWING VANC CEFEPIME FLAGYL  Final   Gram Stain   Final    FEW WBC PRESENT, PREDOMINANTLY PMN ABUNDANT GRAM POSITIVE COCCI IN PAIRS IN CLUSTERS    Culture   Final    MODERATE GROUP B STREP(S.AGALACTIAE)ISOLATED TESTING AGAINST S. AGALACTIAE NOT ROUTINELY PERFORMED DUE TO PREDICTABILITY OF AMP/PEN/VAN SUSCEPTIBILITY. Performed at [facility], [place] [address], [place], [place] [address]    Report Status PENDING  Incomplete    Radiology Reports Dg Chest 2 View  Result Date: 01/02/2018 CLINICAL DATA:  Weakness and tremors. Gait abnormalities and memory issues. Sepsis. EXAM: CHEST - 2 VIEW COMPARISON:  12/21/2017 FINDINGS: Mild cardiac enlargement. No vascular congestion, edema, or consolidation. No blunting of costophrenic angles. No pneumothorax. Calcification of the aorta. Mediastinal contours appear intact. Degenerative changes in the spine and shoulders. Old resection or resorption of the distal right clavicle. IMPRESSION: Mild cardiac enlargement. No evidence of active pulmonary disease. Aortic atherosclerosis. Electronically Signed   By: Lucienne Capers M.D.   On: 01/02/2018 21:51   Dg Chest 2 View  Result Date: 12/21/2017 CLINICAL DATA:  Patient reports upper chest pain (points to base of neck) all day that has gotten worse over the days. Hx/o asthma, carotid arterial disease, CHF, COPD, PAD, and diabetes Smoker EXAM: CHEST - 2 VIEW COMPARISON:   11/21/2017 FINDINGS: Mild hyperinflation. Midline trachea. Normal heart size. Atherosclerosis in the transverse aorta. No pleural effusion or pneumothorax. Diffuse peribronchial thickening. Clear lungs. IMPRESSION: No acute cardiopulmonary disease. Hyperinflation and mild interstitial thickening, most consistent with COPD/chronic bronchitis. Aortic Atherosclerosis (ICD10-I70.0). Electronically Signed   By: Abigail Miyamoto M.D.   On: 12/21/2017 23:35   Ct Chest Wo Contrast  Result Date: 12/22/2017 CLINICAL DATA:  57 year old male with chest pain and shortness of breath. EXAM: CT CHEST WITHOUT CONTRAST TECHNIQUE: Multidetector CT imaging of the chest was performed following the standard protocol without IV contrast. COMPARISON:  Chest radiograph dated 12/21/2017 and CT dated 04/22/2017 FINDINGS: Evaluation of this exam is limited in the absence of intravenous contrast. Cardiovascular: Top-normal cardiac size. No pericardial effusion. Advanced multi vessel coronary vascular calcification. There is mild atherosclerotic calcification of the thoracic aorta. The aorta and central pulmonary arteries are otherwise grossly  unremarkable on this noncontrast CT. Mediastinum/Nodes: No hilar or mediastinal adenopathy. Esophagus and the thyroid gland are grossly unremarkable. No mediastinal fluid collection. Lungs/Pleura: Bilateral ground-glass nodular densities most consistent with pneumonia, likely atypical in etiology. Clinical correlation and follow-up to resolution recommended. No focal consolidation, pleural effusion, or pneumothorax. Upper Abdomen: No acute abnormality. Musculoskeletal: Degenerative changes of the spine. No acute osseous pathology. IMPRESSION: 1. Innumerable small ground-glass pulmonary nodules most consistent with an inflammatory/infectious etiology. Clinical correlation and follow-up to resolution recommended. 2. Advanced multi vessel coronary vascular calcification. 3.  Aortic Atherosclerosis  (ICD10-I70.0). Electronically Signed   By: Anner Crete M.D.   On: 12/22/2017 05:37   Mr Foot Right Wo Contrast  Result Date: 01/03/2018 CLINICAL DATA:  Large open wound near the base of the fifth metatarsal. EXAM: MRI OF THE RIGHT FOREFOOT WITHOUT CONTRAST TECHNIQUE: Multiplanar, multisequence MR imaging of the right foot was performed. No intravenous contrast was administered. COMPARISON:  Radiograph 01/02/2018 FINDINGS: Exam is somewhat limited by patient motion and lack of IV contrast. There is a large open wound on the plantar and lateral aspect of the midfoot at the level of the base of the fifth metatarsal. Diffuse surrounding cellulitis but no discrete drainable soft tissue abscess. Diffuse abnormal T1 and T2 signal intensity in the cuboid, fifth metatarsal and base of the fourth metatarsal. There is also fluid in the cuboid articulation with the fourth and fifth metatarsals. Findings consistent with septic arthritis and osteomyelitis. Mild edema like signal abnormality in the midfoot bony structures more medially likely related to advanced degenerative disease but osteomyelitis can't be totally excluded. The tibiotalar and subtalar joints are maintained. Small joint effusions. Diffuse myositis without definite findings for pyomyositis. IMPRESSION: 1. Large plantar and lateral open wound with associated significant cellulitis and myofasciitis. No discrete drainable soft tissue abscess or pyomyositis. 2. MR findings consistent with septic arthritis at the cuboid articulation with the fourth and fifth metatarsal bases and associated osteomyelitis. 3. Midfoot degenerative changes and edema like signal abnormality, likely degenerative but could not exclude osteomyelitis. Electronically Signed   By: Marijo Sanes M.D.   On: 01/03/2018 13:37   Mr Foot Right Wo Contrast  Result Date: 12/23/2017 CLINICAL DATA:  Diabetic patient with a chronic skin ulceration on the lateral aspect of the right foot at the  base of the fifth metatarsal. EXAM: MRI OF THE RIGHT FOREFOOT WITHOUT CONTRAST TECHNIQUE: Multiplanar, multisequence MR imaging of the right forefoot was performed. No intravenous contrast was administered. COMPARISON:  Plain films right foot 12/22/2017 and 07/10/2016. MRI right foot 05/06/2016 FINDINGS: Bones/Joint/Cartilage Marrow edema in the cuboid is most intense in the lateral 2 cm. There is intense marrow edema throughout the fifth metatarsal. Marrow edema is also identified in the fourth metatarsal and most intense in the proximal 2 cm. Calcaneocuboid joint effusion is noted. Marrow edema about the remaining tarsometatarsal joints is likely related to neuropathic change. Subchondral cysts are seen at the articulation of the navicular and medial cuneiform. No fracture. Ligaments Intact. Muscles and Tendons Atrophy of intrinsic musculature the foot is identified. No intramuscular fluid collection. Soft tissues A subcutaneous fluid collection at the base of the fifth metatarsal deep to a skin ulceration measures 0.7 cm craniocaudal by 0.4 cm transverse by 1.1 cm long. IMPRESSION: Skin ulceration at the base of the fifth metatarsal with marrow edema in the lateral 2 cm of the cuboid and throughout the fifth metatarsal consistent with osteomyelitis. Marrow edema is also seen throughout almost the entire fourth metatarsal  but most intense in the proximal 2 cm where it is worrisome for osteomyelitis. Edema in the remainder of the fourth metatarsal may be reactive. Calcaneocuboid joint effusion worrisome for septic joint. Small fluid collection lateral to the base of the fifth metatarsal is likely an abscess. Charcot change of the midfoot. Electronically Signed   By: Inge Rise M.D.   On: 12/23/2017 12:23   Dg Chest Port 1 View  Result Date: 01/03/2018 CLINICAL DATA:  Acute shortness of breath for 2 days. EXAM: PORTABLE CHEST 1 VIEW COMPARISON:  01/02/2018 FINDINGS: Cardiomegaly and pulmonary vascular  congestion again noted. Interstitial prominence is unchanged. There is no evidence of focal airspace disease, pulmonary edema, suspicious pulmonary nodule/mass, pleural effusion, or pneumothorax. No acute bony abnormalities are identified. IMPRESSION: Cardiomegaly with pulmonary vascular congestion. Interstitial prominence may represent mild interstitial edema. Electronically Signed   By: Margarette Canada M.D.   On: 01/03/2018 15:46   Dg Foot Complete Right  Result Date: 01/02/2018 CLINICAL DATA:  Diabetic ulcer at the base of the fifth metatarsal bone. EXAM: RIGHT FOOT COMPLETE - 3+ VIEW COMPARISON:  Right foot radiograph 12/22/2017. MRI right foot 12/23/2017 FINDINGS: Large soft tissue ulceration over the plantar surface of the lateral right foot at the level of the fifth metatarsal base. This was present on the previous study. In the interval, there is development of soft tissue gas along the dorsum of the right foot. This likely reflects progression of infection with gas-forming organism. No definite radiographic changes to suggest osteomyelitis, but changes of osteomyelitis were demonstrated in the fourth and fifth metatarsal bones on the previous MRI. Degenerative changes in the interphalangeal and intertarsal joints. Plantar calcaneal spur. No radiopaque soft tissue foreign bodies. IMPRESSION: Large soft tissue ulceration over the plantar surface of the right foot at the level of the fifth metatarsal base as seen previously. New soft tissue gas along the dorsum of the right foot likely representing progression of infection with gas-forming organism. No radiographic evidence of osteomyelitis, but previous MRI showed evidence of osteomyelitis in the fourth and fifth metatarsal bones. Electronically Signed   By: Lucienne Capers M.D.   On: 01/02/2018 22:46   Dg Foot Complete Right  Result Date: 12/22/2017 CLINICAL DATA:  57 year old male with diabetic ulcer on the plantar right foot. EXAM: RIGHT FOOT COMPLETE  - 3+ VIEW COMPARISON:  Right foot radiograph dated 07/10/2016 FINDINGS: There is no acute fracture or dislocation. Degenerative changes of the midfoot primarily involving the third tarsometatarsal joint. No periosteal reaction or erosive changes to suggest acute osteomyelitis. There is a large ulcer of the lateral aspect of the plantar soft tissues of the midfoot over the base of the fifth metatarsal which appears larger compared to the prior radiograph. There is diffuse soft tissue edema. No radiopaque foreign object. IMPRESSION: 1. Large soft tissue ulcer at the lateral plantar midfoot. 2. No acute fracture or dislocation. No radiographic evidence of acute osteomyelitis. MRI or a WBC nuclear scan may provide better evaluation if there is high clinical concern for acute osteomyelitis. Electronically Signed   By: Anner Crete M.D.   On: 12/22/2017 05:42     CBC Recent Labs  Lab 01/02/18 2153 01/03/18 0453 01/04/18 0031 01/05/18 0413  WBC 20.6* 17.7* 17.6* 11.6*  HGB 9.0* 8.7* 8.8* 10.0*  HCT 26.7* 25.1* 26.0* 29.4*  PLT 290 278 271 316  MCV 83.9 83.8 83.3 83.1  MCH 28.1 28.9 28.0 28.4  MCHC 33.5 34.5 33.6 34.2  RDW 18.4* 18.0* 18.0* 18.4*  LYMPHSABS 0.8*  --   --   --   MONOABS 1.5*  --   --   --   EOSABS 0.0  --   --   --   BASOSABS 0.0  --   --   --     Chemistries  Recent Labs  Lab 01/02/18 2153 01/03/18 0453 01/04/18 0031 01/05/18 0413  NA 125* 129* 127* 133*  K 5.4* 4.4 5.2* 4.7  CL 86* 92* 93* 98  CO2 30 29 27 29   GLUCOSE 472* 360* 364* 363*  BUN 33* 34* 39* 54*  CREATININE 2.02* 1.84* 1.48* 1.46*  CALCIUM 8.5* 8.2* 8.5* 8.5*  AST 62*  --   --   --   ALT 26  --   --   --   ALKPHOS 60  --   --   --   BILITOT 1.1  --   --   --    ------------------------------------------------------------------------------------------------------------------ estimated creatinine clearance is 65.7 mL/min (A) (by C-G formula based on SCr of 1.46 mg/dL  (H)). ------------------------------------------------------------------------------------------------------------------ No results for input(s): HGBA1C in the last 72 hours. ------------------------------------------------------------------------------------------------------------------ No results for input(s): CHOL, HDL, LDLCALC, TRIG, CHOLHDL, LDLDIRECT in the last 72 hours. ------------------------------------------------------------------------------------------------------------------ No results for input(s): TSH, T4TOTAL, T3FREE, THYROIDAB in the last 72 hours.  Invalid input(s): FREET3 ------------------------------------------------------------------------------------------------------------------ No results for input(s): VITAMINB12, FOLATE, FERRITIN, TIBC, IRON, RETICCTPCT in the last 72 hours.  Coagulation profile Recent Labs  Lab 01/02/18 2153  INR 1.21    No results for input(s): DDIMER in the last 72 hours.  Cardiac Enzymes Recent Labs  Lab 01/03/18 1026 01/03/18 1654 01/04/18 0031  TROPONINI 1.95* 2.49* 2.17*   ------------------------------------------------------------------------------------------------------------------ Invalid input(s): POCBNP    Assessment & Plan  Patient's 57 year old with left foot wound recent admission for sepsis related to pneumonia and foot infection   #1  Sepsis ([place]) due to foot infection blood cultures positive for streptococcus agalactia Will change antibiotics to ampicillin appreciate podiatry input status post excision of the fifth metatarsal and fourth metatarsal base with incision and drainage of the dorsal right midfoot which surgical cultures   #2 Coronary artery disease -with possible non-ST MI felt to be due to stress related to his sepsis patient has three-vessel disease and awaiting improvement from his medical conditions prior to CABG  #3  Diabetic foot infection ([place]) status post debridement as above, Likely  peripheral vascular disease however due to his coronary artery disease likely not a good candidate for surgical intervention currently  #4  AKI (acute kidney injury) ([place]) -improved continue to monitor  #5  Hypertension -hold antihypertensives for now as the patient's blood pressure is borderline low   #6 chronic systolic CHF (congestive heart failure) ([place]) -discontinue IV fluid  #7  Diabetes ([place]) -poor control due to infection start pre-meal insulin continue Lantus    #8 hyperlipidemia -Home dose antilipid       Code Status Orders  (From admission, onward)        Start     Ordered   01/03/18 0409  Full code  Continuous     01/03/18 0408    Code Status History    Date Active Date Inactive Code Status Order ID Comments User Context   12/22/2017 0607 12/23/2017 1916 Full Code [id number]  [place] Foreman, MD Inpatient   11/21/2017 0415 11/23/2017 1444 Full Code [id number]  Lance Coon, MD Inpatient   04/23/2017 0355 04/29/2017 1353 Full Code [id number]  Saundra Shelling, MD Inpatient   07/11/2016 [phone]  07/11/2016 1851 Full Code [id number]  Saundra Shelling, MD Inpatient   05/06/2016 0005 05/07/2016 1417 Full Code [id number]  Harvie Bridge, DO Inpatient   11/21/2015 1716 11/22/2015 1950 Full Code [id number]  Gladstone Lighter, MD Inpatient   07/06/2015 0948 07/06/2015 1437 Full Code [id number]  Algernon Huxley, MD Inpatient           Consults podiatry, cardiology   DVT Prophylaxis  Lovenox   Lab Results  Component Value Date   PLT 316 01/05/2018     Time Spent in minutes 51min Greater than 50% of time spent in care coordination and counseling patient regarding the condition and plan of care.   Dustin Flock M.D on 01/05/2018 at 2:46 PM  Between 7am to 6pm - Pager - [phone]  After 6pm go to [email or web address] - Proofreader  [employer]   Office  [phone]

## 2018-01-05 NOTE — Progress Notes (Signed)
Pts CBG is 486. MD notified. Orders for 20 units once and MD already added scheduled insulin with meals. I will continue to assess.

## 2018-01-05 NOTE — Progress Notes (Signed)
Md notified. Pts CBG is 417. Orders to give 20 unit novolog. I will continue to assess.

## 2018-01-05 NOTE — Progress Notes (Signed)
Inpatient Diabetes Program Recommendations  AACE/ADA: New Consensus Statement on Inpatient Glycemic Control (2015)  Target Ranges:  Prepandial:   less than 140 mg/dL      Peak postprandial:   less than 180 mg/dL (1-2 hours)      Critically ill patients:  140 - 180 mg/dL   Results for [name] [name], [name] [name] (MRN [id number]) as of 01/05/2018 10:44  Ref. Range 01/04/2018 00:39 01/04/2018 09:43 01/04/2018 12:04 01/04/2018 17:14 01/04/2018 20:55  Glucose-Capillary Latest Ref Range: 70 - 99 mg/dL 352 (H) 409 (H)  5 units NOVOLOG  413 (H)  15 units NOVOLOG  503 (HH)  20 units NOVOLOG  479 (H)  20 units REGULAR +  60 units LANTUS   Results for [name] [name], [name] [name] (MRN [id number]) as of 01/05/2018 10:44  Ref. Range 01/04/2018 23:24 01/05/2018 02:07 01/05/2018 08:00  Glucose-Capillary Latest Ref Range: 70 - 99 mg/dL 371 (H)  20 units REGULAR 319 (H) 417 (H)  20 units NOVOLOG     Admit with: Sepsis due to Foot Infection  History: DM, CHF  Home DM Meds: Lantus 40 units QHS       Humalog 0-10 units TID per SSI       Metformin 500 mg daily  Current Insulin Orders: Lantus 60 units QHS      Novolog Resistant Correction Scale/ SSI (0-20 units) TID AC + HS      Patient currently receiving Solumedrol 60 mg Q6 hours.  CBGs >300-400 mg/dl.  Note that Lantus increased to 60 units QHS last PM.   Underwent I&D yesterday AM.     MD- Please consider the following in-hospital insulin adjustments:  1. Increase Lantus further to 72 units QHS (20% increase)  2. Start Novolog Meal Coverage: Novolog 6 units  TID with meals (Please add the following Hold Parameters: Hold if pt eats <50% of meal, Hold if pt NPO)       --Will follow patient during hospitalization--  Wyn Quaker RN, MSN, CDE Diabetes Coordinator Inpatient Glycemic Control Team Team Pager: [phone] (8a-5p)

## 2018-01-05 NOTE — Consult Note (Signed)
[place] Nurse wound consult note Reason for Consult:PAD with infected left foot wound.  S/P podiatry debridement fourth and fifth metatarsal.  Defer to their service for oversight.  Will not follow at this time.  Please re-consult if needed.  Domenic Moras RN BSN [place] Pager [phone]

## 2018-01-05 NOTE — Progress Notes (Signed)
[facility]  Daily Progress Note   Will plan on right lower extremity angiogram with possible intervention with Dr. Delana Meyer on Tuesday, January 06, 2018.  Marcelle Overlie PA-C 01/05/2018 7:21 PM

## 2018-01-05 NOTE — Progress Notes (Signed)
Recheck on CBG  Now 371. Dr. Jannifer Franklin paged again, gave orders for another 20U IV insulin, and recheck in an hour. Will give insulin and recheck.   Conley Simmonds, RN, BSN

## 2018-01-06 ENCOUNTER — Encounter
Admission: EM | Disposition: A | Discharge status: Home Health | Payer: Self-pay | Source: Home / Self Care | Attending: Internal Medicine

## 2018-01-06 ENCOUNTER — Inpatient Hospital Stay (HOSPITAL_COMMUNITY)
Admit: 2018-01-06 | Discharge: 2018-01-06 | Disposition: A | Discharge status: Home / Self Care | Payer: Medicaid Other | Attending: Internal Medicine | Admitting: Internal Medicine

## 2018-01-06 DIAGNOSIS — I5022 Chronic systolic (congestive) heart failure: Secondary | ICD-10-CM

## 2018-01-06 DIAGNOSIS — R0600 Dyspnea, unspecified: Secondary | ICD-10-CM

## 2018-01-06 DIAGNOSIS — I255 Ischemic cardiomyopathy: Secondary | ICD-10-CM

## 2018-01-06 DIAGNOSIS — I2510 Atherosclerotic heart disease of native coronary artery without angina pectoris: Secondary | ICD-10-CM

## 2018-01-06 DIAGNOSIS — I70238 Atherosclerosis of native arteries of right leg with ulceration of other part of lower right leg: Secondary | ICD-10-CM

## 2018-01-06 HISTORY — PX: PERIPHERAL VASCULAR BALLOON ANGIOPLASTY: CATH118281

## 2018-01-06 LAB — URINE CULTURE: Culture: NO GROWTH

## 2018-01-06 LAB — BASIC METABOLIC PANEL
Anion gap: 6 (ref 5–15)
BUN: 46 mg/dL — ABNORMAL HIGH (ref 6–20)
CO2: 31 mmol/L (ref 22–32)
Calcium: 8.7 mg/dL — ABNORMAL LOW (ref 8.9–10.3)
Chloride: 102 mmol/L (ref 98–111)
Creatinine, Ser: 1.09 mg/dL (ref 0.61–1.24)
GFR calc Af Amer: >60 mL/min (ref >60)
GFR calc non Af Amer: >60 mL/min (ref >60)
Glucose, Bld: 341 mg/dL — ABNORMAL HIGH (ref 70–99)
Potassium: 4.6 mmol/L (ref 3.5–5.1)
Sodium: 139 mmol/L (ref 135–145)

## 2018-01-06 LAB — CULTURE, BLOOD (ROUTINE X 2)

## 2018-01-06 LAB — CBC
HCT: 29.7 % — ABNORMAL LOW (ref 40.0–52.0)
Hemoglobin: 10.2 g/dL — ABNORMAL LOW (ref 13.0–18.0)
MCH: 29.1 pg (ref 26.0–34.0)
MCHC: 34.5 g/dL (ref 32.0–36.0)
MCV: 84.2 fL (ref 80.0–100.0)
Platelets: 337 10*3/uL (ref 150–440)
RBC: 3.52 MIL/uL — ABNORMAL LOW (ref 4.40–5.90)
RDW: 18.4 % — ABNORMAL HIGH (ref 11.5–14.5)
WBC: 12.9 10*3/uL — ABNORMAL HIGH (ref 3.8–10.6)

## 2018-01-06 LAB — ECHOCARDIOGRAM COMPLETE
Height: 67 in
Weight: 3667.2 oz

## 2018-01-06 LAB — GLUCOSE, CAPILLARY
Glucose-Capillary: 344 mg/dL — ABNORMAL HIGH (ref 70–99)
Glucose-Capillary: 275 mg/dL — ABNORMAL HIGH (ref 70–99)
Glucose-Capillary: 234 mg/dL — ABNORMAL HIGH (ref 70–99)
Glucose-Capillary: 115 mg/dL — ABNORMAL HIGH (ref 70–99)
Glucose-Capillary: 273 mg/dL — ABNORMAL HIGH (ref 70–99)

## 2018-01-06 LAB — MAGNESIUM: Magnesium: 2.3 mg/dL (ref 1.7–2.4)

## 2018-01-06 LAB — SURGICAL PATHOLOGY

## 2018-01-06 SURGERY — PERIPHERAL VASCULAR BALLOON ANGIOPLASTY
Anesthesia: Moderate Sedation | Laterality: Right

## 2018-01-06 MED ORDER — MIDAZOLAM HCL 2 MG/2ML IJ SOLN
INTRAMUSCULAR | Status: DC | PRN
  Administered 2018-01-06: 1 mg via INTRAVENOUS
  Administered 2018-01-06: 2 mg via INTRAVENOUS
  Administered 2018-01-06 (×2): 1 mg via INTRAVENOUS

## 2018-01-06 MED ORDER — HEPARIN SODIUM (PORCINE) 1000 UNIT/ML IJ SOLN
INTRAMUSCULAR | Status: DC | PRN
  Administered 2018-01-06: 5000 [IU] via INTRAVENOUS

## 2018-01-06 MED ORDER — FENTANYL CITRATE (PF) 100 MCG/2ML IJ SOLN
INTRAMUSCULAR | Status: AC
  Filled 2018-01-06: qty 2

## 2018-01-06 MED ORDER — OXYCODONE HCL 5 MG PO TABS
ORAL_TABLET | ORAL | Status: AC
  Filled 2018-01-06: qty 2

## 2018-01-06 MED ORDER — PERFLUTREN LIPID MICROSPHERE
1.0000 mL | INTRAVENOUS | Status: AC | PRN
  Administered 2018-01-06: 2 mL via INTRAVENOUS
  Filled 2018-01-06: qty 10

## 2018-01-06 MED ORDER — CLINDAMYCIN PHOSPHATE 300 MG/50ML IV SOLN
300.0000 mg | Freq: Once | INTRAVENOUS | Status: AC
  Administered 2018-01-06: 300 mg via INTRAVENOUS
  Filled 2018-01-06: qty 50

## 2018-01-06 MED ORDER — METRONIDAZOLE 500 MG PO TABS
500.0000 mg | ORAL_TABLET | Freq: Three times a day (TID) | ORAL | Status: DC
  Administered 2018-01-06 – 2018-01-08 (×6): 500 mg via ORAL
  Filled 2018-01-06 (×9): qty 1

## 2018-01-06 MED ORDER — HEPARIN SODIUM (PORCINE) 1000 UNIT/ML IJ SOLN
INTRAMUSCULAR | Status: AC
  Filled 2018-01-06: qty 1

## 2018-01-06 MED ORDER — IOPAMIDOL (ISOVUE-300) INJECTION 61%
INTRAVENOUS | Status: DC | PRN
  Administered 2018-01-06: 95 mL via INTRA_ARTERIAL

## 2018-01-06 MED ORDER — HEPARIN (PORCINE) IN NACL 1000-0.9 UT/500ML-% IV SOLN
INTRAVENOUS | Status: AC
  Filled 2018-01-06: qty 1000

## 2018-01-06 MED ORDER — CLINDAMYCIN PHOSPHATE 300 MG/50ML IV SOLN
INTRAVENOUS | Status: AC
  Administered 2018-01-06: 300 mg via INTRAVENOUS
  Filled 2018-01-06: qty 50

## 2018-01-06 MED ORDER — FENTANYL CITRATE (PF) 100 MCG/2ML IJ SOLN
INTRAMUSCULAR | Status: DC | PRN
  Administered 2018-01-06: 50 ug via INTRAVENOUS
  Administered 2018-01-06: 25 ug via INTRAVENOUS
  Administered 2018-01-06: 50 ug via INTRAVENOUS
  Administered 2018-01-06: 25 ug via INTRAVENOUS
  Administered 2018-01-06: 50 ug via INTRAVENOUS

## 2018-01-06 MED ORDER — MIDAZOLAM HCL 5 MG/5ML IJ SOLN
INTRAMUSCULAR | Status: AC
  Filled 2018-01-06: qty 5

## 2018-01-06 MED ORDER — MIDAZOLAM HCL 2 MG/2ML IJ SOLN
INTRAMUSCULAR | Status: AC
  Filled 2018-01-06: qty 2

## 2018-01-06 MED ORDER — LIDOCAINE HCL (PF) 1 % IJ SOLN
INTRAMUSCULAR | Status: AC
  Filled 2018-01-06: qty 30

## 2018-01-06 MED ORDER — CLINDAMYCIN PHOSPHATE 300 MG/50ML IV SOLN
INTRAVENOUS | Status: AC
  Filled 2018-01-06: qty 50

## 2018-01-06 SURGICAL SUPPLY — 37 items
BALLN LUTONIX 6X220X130 (BALLOONS) ×2
BALLN LUTONIX AV 6X60X75 (BALLOONS) ×2
BALLN LUTONIX DCB 6X60X130 (BALLOONS) ×2
BALLN ULTRASCORE 5X200X130 (BALLOONS) ×2
BALLN ULTRASCORE 6X300X130 (BALLOONS) ×2
BALLN ULTRASCORE 6X40X130 (BALLOONS) ×2
BALLOON LUTONIX 6X220X130 (BALLOONS) ×1 IMPLANT
BALLOON LUTONIX AV 6X60X75 (BALLOONS) ×1 IMPLANT
BALLOON LUTONIX DCB 6X60X130 (BALLOONS) ×1 IMPLANT
BALLOON ULTRASCORE 5X200X130 (BALLOONS) ×1 IMPLANT
BALLOON ULTRASCORE 6X300X130 (BALLOONS) ×1 IMPLANT
BALLOON ULTRASCORE 6X40X130 (BALLOONS) ×1 IMPLANT
CANNULA 5F STIFF (CANNULA) ×4 IMPLANT
CATH BEACON 5 .035 40 KMP TP (CATHETERS) ×1 IMPLANT
CATH BEACON 5 .035 65 RIM TIP (CATHETERS) ×2 IMPLANT
CATH BEACON 5 .038 40 KMP TP (CATHETERS) ×1
CATH PIG 70CM (CATHETERS) ×2 IMPLANT
CATH SEEKER .035X135CM (CATHETERS) ×2 IMPLANT
CATH VERT 5FR 125CM (CATHETERS) ×2 IMPLANT
COVER DRAPE FLUORO 36X44 (DRAPES) ×2 IMPLANT
DEVICE PRESTO INFLATION (MISCELLANEOUS) ×2 IMPLANT
DEVICE STARCLOSE SE CLOSURE (Vascular Products) ×2 IMPLANT
DEVICE TORQUE .025-.038 (MISCELLANEOUS) ×2 IMPLANT
GUIDEWIRE SUPER STIFF .035X180 (WIRE) ×2 IMPLANT
NEEDLE ENTRY 21GA 7CM ECHOTIP (NEEDLE) ×2 IMPLANT
PACK ANGIOGRAPHY (CUSTOM PROCEDURE TRAY) ×2 IMPLANT
SET INTRO CAPELLA COAXIAL (SET/KITS/TRAYS/PACK) ×2 IMPLANT
SHEATH ANL2 6FRX45 HC (SHEATH) ×2 IMPLANT
SHEATH BRITE TIP 5FRX11 (SHEATH) ×2 IMPLANT
SHEATH BRITE TIP 6FRX11 (SHEATH) ×2 IMPLANT
SHIELD X-DRAPE GOLD 12X17 (MISCELLANEOUS) ×2 IMPLANT
STENT LIFESTENT 5F 7X30X135 (Permanent Stent) ×2 IMPLANT
STENT LIFESTENT 7X150X130 (Permanent Stent) ×2 IMPLANT
TUBING CONTRAST HIGH PRESS 72 (TUBING) ×2 IMPLANT
WIRE AQUATRACK .035X260CM (WIRE) ×2 IMPLANT
WIRE J 3MM .035X145CM (WIRE) ×2 IMPLANT
WIRE MAGIC TORQUE 315CM (WIRE) ×2 IMPLANT

## 2018-01-06 NOTE — Progress Notes (Signed)
Dr. Delana Meyer at bedside, speaking with pt. And his girlfriend "of 20 years" (per pt.). Both verbalize understanding of conversation with MD.

## 2018-01-06 NOTE — Care Management (Signed)
For angiogram today.  Informed will need wound vac at discharge.  No agency preference.  Referral called to [facility]

## 2018-01-06 NOTE — Progress Notes (Signed)
[place] [facility]                                                                                                                                                                                  Patient Demographics   [name] [name], is a 57 y.o. male, DOB - [birth year], SFK:[id number]  Admit date - 01/02/2018   Admitting Physician Lance Coon, MD  Outpatient Primary MD for the patient is Clarisse Gouge, MD   LOS - 4  Subjective: Patient waiting for arteriogram   Review of Systems:   CONSTITUTIONAL: No documented fever. No fatigue, weakness. No weight gain, no weight loss.  EYES: No blurry or double vision.  ENT: No tinnitus. No postnasal drip. No redness of the oropharynx.  RESPIRATORY: No cough, no wheeze, no hemoptysis. No dyspnea.  CARDIOVASCULAR: No chest pain. No orthopnea. No palpitations. No syncope.  GASTROINTESTINAL: No nausea, no vomiting or diarrhea. No abdominal pain. No melena or hematochezia.  GENITOURINARY: No dysuria or hematuria.  ENDOCRINE: No polyuria or nocturia. No heat or cold intolerance.  HEMATOLOGY: No anemia. No bruising. No bleeding.  INTEGUMENTARY: No rashes. No lesions.  MUSCULOSKELETAL: Positive foot pain NEUROLOGIC: No numbness, tingling, or ataxia. No seizure-type activity.  PSYCHIATRIC: No anxiety. No insomnia. No ADD.    Vitals:   Vitals:   01/06/18 1430 01/06/18 1435 01/06/18 1440 01/06/18 1445  BP:      Pulse:      Resp:      Temp:      TempSrc:      SpO2: 99% 99% 99% 99%  Weight:      Height:        Wt Readings from Last 3 Encounters:  01/06/18 103.9 kg (229 lb)  12/23/17 105.9 kg (233 lb 7.5 oz)  11/23/17 104.5 kg (230 lb 4.8 oz)     Intake/Output Summary (Last 24 hours) at 01/06/2018 1446 Last data filed at 01/06/2018 0709 Gross per 24 hour  Intake 640 ml  Output 1400 ml  Net -760 ml    Physical Exam:   GENERAL: Pleasant-appearing in no apparent distress.  HEAD, EYES, EARS, NOSE AND  THROAT: Atraumatic, normocephalic. Extraocular muscles are intact. Pupils equal and reactive to light. Sclerae anicteric. No conjunctival injection. No oro-pharyngeal erythema.  NECK: Supple. There is no jugular venous distention. No bruits, no lymphadenopathy, no thyromegaly.  HEART: Regular rate and rhythm,. No murmurs, no rubs, no clicks.  LUNGS: Clear to auscultation bilaterally. No rales or rhonchi. No wheezes.  ABDOMEN: Soft, flat, nontender, nondistended. Has good bowel sounds. No hepatosplenomegaly appreciated.  EXTREMITIES: He has dressing in place on the right foot NEUROLOGIC: The patient  is alert, awake, and oriented x3 with no focal motor or sensory deficits appreciated bilaterally.  SKIN: Moist and warm with no rashes appreciated.  Psych: Not anxious, depressed LN: No inguinal LN enlargement    Antibiotics   Anti-infectives (From admission, onward)   Start     Dose/Rate Route Frequency Ordered Stop   01/06/18 1400  clindamycin (CLEOCIN) IVPB 300 mg     300 mg 100 mL/hr over 30 Minutes Intravenous  Once 01/06/18 1229 01/06/18 1340   01/06/18 1230  clindamycin (CLEOCIN) 300 MG/50ML IVPB  Status:  Discontinued    Note to Pharmacy:  Despina Arias  : cabinet override      01/06/18 1230 01/06/18 1242   01/05/18 1800  ampicillin (OMNIPEN) 1 g in sodium chloride 0.9 % 100 mL IVPB  Status:  Discontinued     1 g 300 mL/hr over 20 Minutes Intravenous Every 6 hours 01/05/18 1449 01/05/18 1614   01/05/18 1700  [MAR Hold]  ampicillin (OMNIPEN) 2 g in sodium chloride 0.9 % 100 mL IVPB     (MAR Hold since Tue 01/06/2018 at 1234. Reason: Transfer to a Procedural area.)   2 g 300 mL/hr over 20 Minutes Intravenous Every 4 hours 01/05/18 1614     01/04/18 2200  ceFEPIme (MAXIPIME) 2 g in sodium chloride 0.9 % 100 mL IVPB  Status:  Discontinued     2 g 200 mL/hr over 30 Minutes Intravenous Every 8 hours 01/04/18 1321 01/05/18 1457   01/04/18 1400  vancomycin (VANCOCIN) IVPB 1000 mg/200 mL  premix  Status:  Discontinued     1,000 mg 200 mL/hr over 60 Minutes Intravenous Every 12 hours 01/04/18 1318 01/05/18 1457   01/03/18 1000  ceFEPIme (MAXIPIME) 2 g in sodium chloride 0.9 % 100 mL IVPB  Status:  Discontinued     2 g 200 mL/hr over 30 Minutes Intravenous Every 12 hours 01/03/18 0052 01/04/18 1321   01/03/18 0600  vancomycin (VANCOCIN) IVPB 1000 mg/200 mL premix  Status:  Discontinued     1,000 mg 200 mL/hr over 60 Minutes Intravenous Every 18 hours 01/03/18 0052 01/04/18 1318   01/02/18 2230  metroNIDAZOLE (FLAGYL) IVPB 500 mg     500 mg 100 mL/hr over 60 Minutes Intravenous  Once 01/02/18 2227 01/03/18 0032   01/02/18 2145  ceFEPIme (MAXIPIME) 2 g in sodium chloride 0.9 % 100 mL IVPB     2 g 200 mL/hr over 30 Minutes Intravenous  Once 01/02/18 2133 01/02/18 2310   01/02/18 2145  vancomycin (VANCOCIN) IVPB 1000 mg/200 mL premix     1,000 mg 200 mL/hr over 60 Minutes Intravenous  Once 01/02/18 2133 01/02/18 2335      Medications   Scheduled Meds: . [MAR Hold] aspirin EC  81 mg Oral Daily  . [MAR Hold] atorvastatin  80 mg Oral Daily  . [MAR Hold] carvedilol  6.25 mg Oral BID  . [MAR Hold] clopidogrel  75 mg Oral Daily  . [MAR Hold] enoxaparin (LOVENOX) injection  40 mg Subcutaneous Q24H  . [MAR Hold] gabapentin  400 mg Oral BID  . [MAR Hold] insulin aspart  0-20 Units Subcutaneous TID AC & HS  . [MAR Hold] insulin aspart  6 Units Subcutaneous TID WC  . [MAR Hold] insulin glargine  60 Units Subcutaneous QHS  . [MAR Hold] ipratropium-albuterol  3 mL Nebulization Q6H  . [MAR Hold] multivitamin-lutein  1 capsule Oral Daily  . [MAR Hold] nicotine  21 mg Transdermal Daily  . [facility]  Hold] nutrition supplement (JUVEN)  1 packet Oral BID BM  . [MAR Hold] protein supplement shake  11 oz Oral BID BM  . [MAR Hold] traZODone  50 mg Oral QHS   Continuous Infusions: . sodium chloride 50 mL/hr at 01/06/18 0846  . [MAR Hold] ampicillin (OMNIPEN) IV Stopped (01/06/18 1230)   PRN  Meds:.[MAR Hold] acetaminophen **OR** [MAR Hold] acetaminophen, [MAR Hold] ALPRAZolam, [MAR Hold] diphenhydrAMINE, fentaNYL, heparin, midazolam, [MAR Hold]  morphine injection, [MAR Hold] ondansetron **OR** [MAR Hold] ondansetron (ZOFRAN) IV, [MAR Hold] oxyCODONE   Data Review:   Micro Results Recent Results (from the past 240 hour(s))  Blood Culture (routine x 2)     Status: Abnormal   Collection Time: 01/02/18  9:53 PM  Result Value Ref Range Status   Specimen Description   Final    BLOOD LEFT WRIST Performed at [facility], [address] [place], [place] [address]    Special Requests   Final    BOTTLES DRAWN AEROBIC AND ANAEROBIC Blood Culture results may not be optimal due to an inadequate volume of blood received in culture bottles Performed at [facility], [address] [place], [place] [address]    Culture  Setup Time   Final    GRAM POSITIVE COCCI ANAEROBIC BOTTLE ONLY CRITICAL VALUE NOTED.  VALUE IS CONSISTENT WITH PREVIOUSLY REPORTED AND CALLED VALUE. Performed at [facility], [address] [place], [place] [address]    Culture (A)  Final    VIRIDANS STREPTOCOCCUS THE SIGNIFICANCE OF ISOLATING THIS ORGANISM FROM A SINGLE SET OF BLOOD CULTURES WHEN MULTIPLE SETS ARE DRAWN IS UNCERTAIN. PLEASE NOTIFY THE MICROBIOLOGY DEPARTMENT WITHIN ONE WEEK IF SPECIATION AND SENSITIVITIES ARE REQUIRED. Performed at [facility], [place] [address], [place], [place] [address]    Report Status 01/06/2018 FINAL  Final  Blood Culture (routine x 2)     Status: Abnormal   Collection Time: 01/02/18  9:53 PM  Result Value Ref Range Status   Specimen Description   Final    BLOOD LEFT ANTECUBITAL Performed at [facility], [address] [place], [place] [address]    Special Requests   Final    BOTTLES DRAWN AEROBIC AND ANAEROBIC Blood Culture results may not be optimal due to an inadequate volume of blood received in culture  bottles Performed at [facility], [address] [place], [place] [address]    Culture  Setup Time   Final    GRAM POSITIVE COCCI IN BOTH AEROBIC AND ANAEROBIC BOTTLES CRITICAL RESULT CALLED TO, READ BACK BY AND VERIFIED WITH: Dicie Beam 01/03/18 @ 1055  [place] Performed at [facility], [place] [address] [place], [place] [address]    Culture GROUP B STREP(S.AGALACTIAE)ISOLATED (A)  Final   Report Status 01/05/2018 FINAL  Final   Organism ID, Bacteria GROUP B STREP(S.AGALACTIAE)ISOLATED  Final      Susceptibility   Group b strep(s.agalactiae)isolated - MIC*    CLINDAMYCIN >=1 RESISTANT Resistant     AMPICILLIN <=0.25 SENSITIVE Sensitive     ERYTHROMYCIN >=8 RESISTANT Resistant     VANCOMYCIN 0.5 SENSITIVE Sensitive     CEFTRIAXONE <=0.12 SENSITIVE Sensitive     LEVOFLOXACIN 1 SENSITIVE Sensitive     PENICILLIN Value in next row Sensitive      SENSITIVE0.12    * GROUP B STREP(S.AGALACTIAE)ISOLATED  Blood Culture ID Panel (Reflexed)     Status: Abnormal   Collection Time: 01/02/18  9:53 PM  Result Value  Ref Range Status   Enterococcus species NOT DETECTED NOT DETECTED Final   Listeria monocytogenes NOT DETECTED NOT DETECTED Final   Staphylococcus species NOT DETECTED NOT DETECTED Final   Staphylococcus aureus NOT DETECTED NOT DETECTED Final   Streptococcus species DETECTED (A) NOT DETECTED Final    Comment: CRITICAL RESULT CALLED TO, READ BACK BY AND VERIFIED WITH: TELDRIN JAMES 01/03/18 @ 1055  [place]    Streptococcus agalactiae DETECTED (A) NOT DETECTED Final    Comment: CRITICAL RESULT CALLED TO, READ BACK BY AND VERIFIED WITH: TELDRIN JAMES 01/03/18 @ 1055  [place]    Streptococcus pneumoniae NOT DETECTED NOT DETECTED Final   Streptococcus pyogenes NOT DETECTED NOT DETECTED Final   Acinetobacter baumannii NOT DETECTED NOT DETECTED Final   Enterobacteriaceae species NOT DETECTED NOT DETECTED Final   Enterobacter cloacae complex NOT DETECTED NOT DETECTED Final    Escherichia coli NOT DETECTED NOT DETECTED Final   Klebsiella oxytoca NOT DETECTED NOT DETECTED Final   Klebsiella pneumoniae NOT DETECTED NOT DETECTED Final   Proteus species NOT DETECTED NOT DETECTED Final   Serratia marcescens NOT DETECTED NOT DETECTED Final   Haemophilus influenzae NOT DETECTED NOT DETECTED Final   Neisseria meningitidis NOT DETECTED NOT DETECTED Final   Pseudomonas aeruginosa NOT DETECTED NOT DETECTED Final   Candida albicans NOT DETECTED NOT DETECTED Final   Candida glabrata NOT DETECTED NOT DETECTED Final   Candida krusei NOT DETECTED NOT DETECTED Final   Candida parapsilosis NOT DETECTED NOT DETECTED Final   Candida tropicalis NOT DETECTED NOT DETECTED Final    Comment: Performed at [facility], [address] [place], [place] [address]  Aerobic/Anaerobic Culture (surgical/deep wound)     Status: None (Preliminary result)   Collection Time: 01/03/18  3:49 AM  Result Value Ref Range Status   Specimen Description   Final    WOUND RIGHT FOOT Performed at [facility], [address] [place], [place] [address]    Special Requests   Final    NONE Performed at [facility], [place], [place] [address]    Gram Stain   Final    RARE WBC PRESENT, PREDOMINANTLY PMN ABUNDANT GRAM POSITIVE COCCI IN PAIRS IN CLUSTERS FEW GRAM NEGATIVE RODS    Culture   Final    ABUNDANT GROUP B STREP(S.AGALACTIAE)ISOLATED TESTING AGAINST S. AGALACTIAE NOT ROUTINELY PERFORMED DUE TO PREDICTABILITY OF AMP/PEN/VAN SUSCEPTIBILITY. HOLDING FOR POSSIBLE ANAEROBE Performed at [facility], [place] [address], [place], [place] [address]    Report Status PENDING  Incomplete  Aerobic/Anaerobic Culture (surgical/deep wound)     Status: None (Preliminary result)   Collection Time: 01/04/18  8:59 AM  Result Value Ref Range Status   Specimen Description WOUND RIGHT FOOT  Final   Special Requests PATIENT ON FOLLOWING VANC CEFEPIME FLAGYL  Final    Gram Stain   Final    FEW WBC PRESENT, PREDOMINANTLY PMN ABUNDANT GRAM POSITIVE COCCI IN PAIRS IN CLUSTERS Performed at [facility], [place] [address] [place], [place] [address]    Culture   Final    MODERATE GROUP B STREP(S.AGALACTIAE)ISOLATED TESTING AGAINST S. AGALACTIAE NOT ROUTINELY PERFORMED DUE TO PREDICTABILITY OF AMP/PEN/VAN SUSCEPTIBILITY. NO ANAEROBES ISOLATED; CULTURE IN PROGRESS FOR 5 DAYS    Report Status PENDING  Incomplete  Urine culture     Status: None   Collection Time: 01/04/18 11:59 PM  Result Value Ref Range Status   Specimen Description   Final    URINE, RANDOM  Performed at [facility], [address] [place], [place] [address]    Special Requests   Final    NONE Performed at [facility], [address], [place], [place] [address]    Culture   Final    NO GROWTH Performed at [facility], [place] [address], [place], [place] [address]    Report Status 01/06/2018 FINAL  Final  CULTURE, BLOOD (ROUTINE X 2) w Reflex to ID Panel     Status: None (Preliminary result)   Collection Time: 01/05/18  4:25 PM  Result Value Ref Range Status   Specimen Description BLOOD BLOOD RIGHT HAND  Final   Special Requests   Final    BOTTLES DRAWN AEROBIC AND ANAEROBIC Blood Culture adequate volume   Culture   Final    NO GROWTH < 24 HOURS Performed at [facility], [address] [place], [place] [address]    Report Status PENDING  Incomplete  CULTURE, BLOOD (ROUTINE X 2) w Reflex to ID Panel     Status: None (Preliminary result)   Collection Time: 01/05/18  5:16 PM  Result Value Ref Range Status   Specimen Description BLOOD BLOOD RIGHT HAND  Final   Special Requests   Final    BOTTLES DRAWN AEROBIC AND ANAEROBIC Blood Culture adequate volume   Culture   Final    NO GROWTH < 24 HOURS Performed at [facility], [address] [place], [place] [address]    Report Status PENDING  Incomplete    Radiology Reports Dg  Chest 2 View  Result Date: 01/02/2018 CLINICAL DATA:  Weakness and tremors. Gait abnormalities and memory issues. Sepsis. EXAM: CHEST - 2 VIEW COMPARISON:  12/21/2017 FINDINGS: Mild cardiac enlargement. No vascular congestion, edema, or consolidation. No blunting of costophrenic angles. No pneumothorax. Calcification of the aorta. Mediastinal contours appear intact. Degenerative changes in the spine and shoulders. Old resection or resorption of the distal right clavicle. IMPRESSION: Mild cardiac enlargement. No evidence of active pulmonary disease. Aortic atherosclerosis. Electronically Signed   By: Lucienne Capers M.D.   On: 01/02/2018 21:51   Dg Chest 2 View  Result Date: 12/21/2017 CLINICAL DATA:  Patient reports upper chest pain (points to base of neck) all day that has gotten worse over the days. Hx/o asthma, carotid arterial disease, CHF, COPD, PAD, and diabetes Smoker EXAM: CHEST - 2 VIEW COMPARISON:  11/21/2017 FINDINGS: Mild hyperinflation. Midline trachea. Normal heart size. Atherosclerosis in the transverse aorta. No pleural effusion or pneumothorax. Diffuse peribronchial thickening. Clear lungs. IMPRESSION: No acute cardiopulmonary disease. Hyperinflation and mild interstitial thickening, most consistent with COPD/chronic bronchitis. Aortic Atherosclerosis (ICD10-I70.0). Electronically Signed   By: Abigail Miyamoto M.D.   On: 12/21/2017 23:35   Ct Chest Wo Contrast  Result Date: 12/22/2017 CLINICAL DATA:  57 year old male with chest pain and shortness of breath. EXAM: CT CHEST WITHOUT CONTRAST TECHNIQUE: Multidetector CT imaging of the chest was performed following the standard protocol without IV contrast. COMPARISON:  Chest radiograph dated 12/21/2017 and CT dated 04/22/2017 FINDINGS: Evaluation of this exam is limited in the absence of intravenous contrast. Cardiovascular: Top-normal cardiac size. No pericardial effusion. Advanced multi vessel coronary vascular calcification. There is mild  atherosclerotic calcification of the thoracic aorta. The aorta and central pulmonary arteries are otherwise grossly unremarkable on this noncontrast CT. Mediastinum/Nodes: No hilar or mediastinal adenopathy. Esophagus and the thyroid gland are grossly unremarkable. No mediastinal fluid collection. Lungs/Pleura: Bilateral ground-glass nodular densities most consistent with pneumonia, likely  atypical in etiology. Clinical correlation and follow-up to resolution recommended. No focal consolidation, pleural effusion, or pneumothorax. Upper Abdomen: No acute abnormality. Musculoskeletal: Degenerative changes of the spine. No acute osseous pathology. IMPRESSION: 1. Innumerable small ground-glass pulmonary nodules most consistent with an inflammatory/infectious etiology. Clinical correlation and follow-up to resolution recommended. 2. Advanced multi vessel coronary vascular calcification. 3.  Aortic Atherosclerosis (ICD10-I70.0). Electronically Signed   By: Anner Crete M.D.   On: 12/22/2017 05:37   Mr Foot Right Wo Contrast  Result Date: 01/03/2018 CLINICAL DATA:  Large open wound near the base of the fifth metatarsal. EXAM: MRI OF THE RIGHT FOREFOOT WITHOUT CONTRAST TECHNIQUE: Multiplanar, multisequence MR imaging of the right foot was performed. No intravenous contrast was administered. COMPARISON:  Radiograph 01/02/2018 FINDINGS: Exam is somewhat limited by patient motion and lack of IV contrast. There is a large open wound on the plantar and lateral aspect of the midfoot at the level of the base of the fifth metatarsal. Diffuse surrounding cellulitis but no discrete drainable soft tissue abscess. Diffuse abnormal T1 and T2 signal intensity in the cuboid, fifth metatarsal and base of the fourth metatarsal. There is also fluid in the cuboid articulation with the fourth and fifth metatarsals. Findings consistent with septic arthritis and osteomyelitis. Mild edema like signal abnormality in the midfoot bony  structures more medially likely related to advanced degenerative disease but osteomyelitis can't be totally excluded. The tibiotalar and subtalar joints are maintained. Small joint effusions. Diffuse myositis without definite findings for pyomyositis. IMPRESSION: 1. Large plantar and lateral open wound with associated significant cellulitis and myofasciitis. No discrete drainable soft tissue abscess or pyomyositis. 2. MR findings consistent with septic arthritis at the cuboid articulation with the fourth and fifth metatarsal bases and associated osteomyelitis. 3. Midfoot degenerative changes and edema like signal abnormality, likely degenerative but could not exclude osteomyelitis. Electronically Signed   By: Marijo Sanes M.D.   On: 01/03/2018 13:37   Mr Foot Right Wo Contrast  Result Date: 12/23/2017 CLINICAL DATA:  Diabetic patient with a chronic skin ulceration on the lateral aspect of the right foot at the base of the fifth metatarsal. EXAM: MRI OF THE RIGHT FOREFOOT WITHOUT CONTRAST TECHNIQUE: Multiplanar, multisequence MR imaging of the right forefoot was performed. No intravenous contrast was administered. COMPARISON:  Plain films right foot 12/22/2017 and 07/10/2016. MRI right foot 05/06/2016 FINDINGS: Bones/Joint/Cartilage Marrow edema in the cuboid is most intense in the lateral 2 cm. There is intense marrow edema throughout the fifth metatarsal. Marrow edema is also identified in the fourth metatarsal and most intense in the proximal 2 cm. Calcaneocuboid joint effusion is noted. Marrow edema about the remaining tarsometatarsal joints is likely related to neuropathic change. Subchondral cysts are seen at the articulation of the navicular and medial cuneiform. No fracture. Ligaments Intact. Muscles and Tendons Atrophy of intrinsic musculature the foot is identified. No intramuscular fluid collection. Soft tissues A subcutaneous fluid collection at the base of the fifth metatarsal deep to a skin  ulceration measures 0.7 cm craniocaudal by 0.4 cm transverse by 1.1 cm long. IMPRESSION: Skin ulceration at the base of the fifth metatarsal with marrow edema in the lateral 2 cm of the cuboid and throughout the fifth metatarsal consistent with osteomyelitis. Marrow edema is also seen throughout almost the entire fourth metatarsal but most intense in the proximal 2 cm where it is worrisome for osteomyelitis. Edema in the remainder of the fourth metatarsal may be reactive. Calcaneocuboid joint effusion worrisome for septic joint. Small  fluid collection lateral to the base of the fifth metatarsal is likely an abscess. Charcot change of the midfoot. Electronically Signed   By: Inge Rise M.D.   On: 12/23/2017 12:23   Dg Chest Port 1 View  Result Date: 01/03/2018 CLINICAL DATA:  Acute shortness of breath for 2 days. EXAM: PORTABLE CHEST 1 VIEW COMPARISON:  01/02/2018 FINDINGS: Cardiomegaly and pulmonary vascular congestion again noted. Interstitial prominence is unchanged. There is no evidence of focal airspace disease, pulmonary edema, suspicious pulmonary nodule/mass, pleural effusion, or pneumothorax. No acute bony abnormalities are identified. IMPRESSION: Cardiomegaly with pulmonary vascular congestion. Interstitial prominence may represent mild interstitial edema. Electronically Signed   By: Margarette Canada M.D.   On: 01/03/2018 15:46   Dg Foot Complete Right  Result Date: 01/02/2018 CLINICAL DATA:  Diabetic ulcer at the base of the fifth metatarsal bone. EXAM: RIGHT FOOT COMPLETE - 3+ VIEW COMPARISON:  Right foot radiograph 12/22/2017. MRI right foot 12/23/2017 FINDINGS: Large soft tissue ulceration over the plantar surface of the lateral right foot at the level of the fifth metatarsal base. This was present on the previous study. In the interval, there is development of soft tissue gas along the dorsum of the right foot. This likely reflects progression of infection with gas-forming organism. No  definite radiographic changes to suggest osteomyelitis, but changes of osteomyelitis were demonstrated in the fourth and fifth metatarsal bones on the previous MRI. Degenerative changes in the interphalangeal and intertarsal joints. Plantar calcaneal spur. No radiopaque soft tissue foreign bodies. IMPRESSION: Large soft tissue ulceration over the plantar surface of the right foot at the level of the fifth metatarsal base as seen previously. New soft tissue gas along the dorsum of the right foot likely representing progression of infection with gas-forming organism. No radiographic evidence of osteomyelitis, but previous MRI showed evidence of osteomyelitis in the fourth and fifth metatarsal bones. Electronically Signed   By: Lucienne Capers M.D.   On: 01/02/2018 22:46   Dg Foot Complete Right  Result Date: 12/22/2017 CLINICAL DATA:  57 year old male with diabetic ulcer on the plantar right foot. EXAM: RIGHT FOOT COMPLETE - 3+ VIEW COMPARISON:  Right foot radiograph dated 07/10/2016 FINDINGS: There is no acute fracture or dislocation. Degenerative changes of the midfoot primarily involving the third tarsometatarsal joint. No periosteal reaction or erosive changes to suggest acute osteomyelitis. There is a large ulcer of the lateral aspect of the plantar soft tissues of the midfoot over the base of the fifth metatarsal which appears larger compared to the prior radiograph. There is diffuse soft tissue edema. No radiopaque foreign object. IMPRESSION: 1. Large soft tissue ulcer at the lateral plantar midfoot. 2. No acute fracture or dislocation. No radiographic evidence of acute osteomyelitis. MRI or a WBC nuclear scan may provide better evaluation if there is high clinical concern for acute osteomyelitis. Electronically Signed   By: Anner Crete M.D.   On: 12/22/2017 05:42     CBC Recent Labs  Lab 01/02/18 2153 01/03/18 0453 01/04/18 0031 01/05/18 0413 01/06/18 0428  WBC 20.6* 17.7* 17.6* 11.6*  12.9*  HGB 9.0* 8.7* 8.8* 10.0* 10.2*  HCT 26.7* 25.1* 26.0* 29.4* 29.7*  PLT 290 278 271 316 337  MCV 83.9 83.8 83.3 83.1 84.2  MCH 28.1 28.9 28.0 28.4 29.1  MCHC 33.5 34.5 33.6 34.2 34.5  RDW 18.4* 18.0* 18.0* 18.4* 18.4*  LYMPHSABS 0.8*  --   --   --   --   MONOABS 1.5*  --   --   --   --  EOSABS 0.0  --   --   --   --   BASOSABS 0.0  --   --   --   --     Chemistries  Recent Labs  Lab 01/02/18 2153 01/03/18 0453 01/04/18 0031 01/05/18 0413 01/06/18 0428  NA 125* 129* 127* 133* 139  K 5.4* 4.4 5.2* 4.7 4.6  CL 86* 92* 93* 98 102  CO2 30 29 27 29 31   GLUCOSE 472* 360* 364* 363* 341*  BUN 33* 34* 39* 54* 46*  CREATININE 2.02* 1.84* 1.48* 1.46* 1.09  CALCIUM 8.5* 8.2* 8.5* 8.5* 8.7*  MG  --   --   --   --  2.3  AST 62*  --   --   --   --   ALT 26  --   --   --   --   ALKPHOS 60  --   --   --   --   BILITOT 1.1  --   --   --   --    ------------------------------------------------------------------------------------------------------------------ estimated creatinine clearance is 87.9 mL/min (by C-G formula based on SCr of 1.09 mg/dL). ------------------------------------------------------------------------------------------------------------------ No results for input(s): HGBA1C in the last 72 hours. ------------------------------------------------------------------------------------------------------------------ No results for input(s): CHOL, HDL, LDLCALC, TRIG, CHOLHDL, LDLDIRECT in the last 72 hours. ------------------------------------------------------------------------------------------------------------------ No results for input(s): TSH, T4TOTAL, T3FREE, THYROIDAB in the last 72 hours.  Invalid input(s): FREET3 ------------------------------------------------------------------------------------------------------------------ No results for input(s): VITAMINB12, FOLATE, FERRITIN, TIBC, IRON, RETICCTPCT in the last 72 hours.  Coagulation profile Recent Labs   Lab 01/02/18 2153  INR 1.21    No results for input(s): DDIMER in the last 72 hours.  Cardiac Enzymes Recent Labs  Lab 01/03/18 1026 01/03/18 1654 01/04/18 0031  TROPONINI 1.95* 2.49* 2.17*   ------------------------------------------------------------------------------------------------------------------ Invalid input(s): POCBNP    Assessment & Plan  Patient's 57 year old with left foot wound recent admission for sepsis related to pneumonia and foot infection   #1  Sepsis ([place]) due to foot infection blood cultures positive for streptococcus agalactia in 1 blood culture positive for strep viridans Continue ampicillin appreciate podiatry input status post excision of the fifth metatarsal and fourth metatarsal base with incision and drainage of the dorsal right midfoot which surgical cultures Vascular evaluation   #2 Coronary artery disease -with possible non-ST MI felt to be due to stress related to his sepsis patient has three-vessel disease and awaiting improvement from his medical conditions prior to CABG  #3  Diabetic foot infection ([place]) status post debridement as above, Plan for angiogram today  #4  AKI (acute kidney injury) ([place]) -improved continue to monitor we will check BMP and tomorrow  #5  Hypertension -hold antihypertensives for now as the patient's blood pressure is borderline low   #6 chronic systolic CHF (congestive heart failure) ([place]) -discontinue IV fluid  #7  Diabetes ([place]) -poor control due to infection start pre-meal insulin continue Lantus    #8 hyperlipidemia -Home dose antilipid       Code Status Orders  (From admission, onward)        Start     Ordered   01/03/18 0409  Full code  Continuous     01/03/18 0408    Code Status History    Date Active Date Inactive Code Status Order ID Comments User Context   12/22/2017 0607 12/23/2017 1916 Full Code [id number]  Harrie Foreman, MD Inpatient   11/21/2017 0415 11/23/2017 1444 Full Code  [id number]  Lance Coon, MD Inpatient   04/23/2017 [phone]  04/29/2017 1353 Full Code [id number]  Saundra Shelling, MD Inpatient   07/11/2016 0324 07/11/2016 1851 Full Code [id number]  Saundra Shelling, MD Inpatient   05/06/2016 0005 05/07/2016 1417 Full Code [id number]  Harvie Bridge, DO Inpatient   11/21/2015 1716 11/22/2015 1950 Full Code [id number]  Gladstone Lighter, MD Inpatient   07/06/2015 0948 07/06/2015 1437 Full Code [id number]  Algernon Huxley, MD Inpatient           Consults podiatry, cardiology   DVT Prophylaxis  Lovenox   Lab Results  Component Value Date   PLT 337 01/06/2018     Time Spent in minutes 34min Greater than 50% of time spent in care coordination and counseling patient regarding the condition and plan of care.   Dustin Flock M.D on 01/06/2018 at 2:46 PM  Between 7am to 6pm - Pager - [phone]  After 6pm go to [email or web address] - Proofreader  [employer]   Office  [phone]

## 2018-01-06 NOTE — Progress Notes (Signed)
*  PRELIMINARY RESULTS* Echocardiogram 2D Echocardiogram has been performed.  Wallie Char Tereka Thorley 01/06/2018, 11:54 AM

## 2018-01-06 NOTE — Progress Notes (Signed)
Per Dr. Posey Pronto, patient to remain on 2A/Telemetry unit when procedure finished.

## 2018-01-06 NOTE — Op Note (Signed)
[name] [name] Percutaneous Study/Intervention Procedural Note   Date of Surgery: 01/06/2018  Surgeon:  Katha Cabal, MD.  Pre-operative Diagnosis: Atherosclerotic occlusive disease bilateral lower extremities with large ulceration and tissue loss of the right foot  Post-operative diagnosis: Same  Procedure(s) Performed: 1. Introduction catheter into right lower extremity 3rd order catheter placement  2. Contrast injection right lower extremity for distal runoff   3. Percutaneous transluminal angioplasty and stent placement right superficial femoral artery and above-knee popliteal 4. Percutaneous transluminal angioplasty of the left external iliac artery to 7 mm             5.  Star close closure left common femoral arteriotomy  Anesthesia: Conscious sedation was administered under my direct supervision by the interventional radiology RN. IV Versed plus fentanyl were utilized. Continuous ECG, pulse oximetry and blood pressure was monitored throughout the entire procedure.  Conscious sedation was for a total of 137 minutes.  Sheath: 6 [place] Ansell left common femoral artery retrograde  Contrast: 95 cc  Fluoroscopy Time: 21.1 minutes  Indications: [name] [name] presents with severe foot infection.  Is undergone incision and drainage in the operating room and now has a wound VAC in place.  He has known atherosclerotic occlusive disease and has had interventions in the past.  Pedal pulses are nonpalpable suggesting progression of his disease.  Angiography has been recommended with the hope for intervention for limb salvage.  The risks and benefits are reviewed all questions answered patient agrees to proceed.  Procedure: [name] [name] is a 57 y.o. y.o. male who was identified and appropriate procedural time out was performed. The patient was then placed supine on the table and prepped and draped in the  usual sterile fashion.   Ultrasound was placed in the sterile sleeve and the left groin was evaluated the left common femoral artery was echolucent and pulsatile indicating patency.  Image was recorded for the permanent record and under real-time visualization a microneedle was inserted into the common femoral artery microwire followed by a micro-sheath.  A J-wire was then advanced through the micro-sheath and a  5 [place] sheath was then inserted over a J-wire. J-wire was not able to be advanced past the proximal external iliac on the left and therefore hand-injection of contrast was performed through the 5 [place] sheath.  Hand-injection contrast demonstrated a string sign (greater than 95% stenosis) of the proximal left external iliac.  The patient was given 5000 units of heparin.  Glidewire and Kumpe catheter were then used to successfully negotiate this iliac stenosis.  A 7 mm x 60 mm Lutonix drug-eluting balloon was then advanced across the lesion.  Antroplasty was performed to 12 atm for 1 minute.  Follow-up hand-injection through the sheath demonstrated approximately 20% residual stenosis with a smooth contour and no evidence of dissection.  Given this finding I elected to proceed with right lower extremity revascularization as this is the limb that is threatened secondary to infection and ulceration.  I then advanced the 5 French pigtail catheter over the wire and it was positioned at the level of T12. AP projection of the aorta was then obtained. Pigtail catheter was repositioned to above the bifurcation and a LAO view of the pelvis was obtained.  Subsequently a rim catheter with the stiff angle Glidewire was used to cross the aortic bifurcation the catheter wire were advanced down into the right distal external iliac artery. Oblique view of the femoral bifurcation was then obtained and subsequently  the wire was reintroduced and the pigtail catheter negotiated into the SFA representing third order  catheter placement. Distal runoff was then performed.  A 6 French Ansell sheath was advanced up and over the bifurcation and positioned in the right femoral artery.  This showed a subtotal occlusion, again greater than 95%, of the common femoral artery itself.  The patient has triple-vessel coronary disease and has been evaluated at [facility] and deemed a non-surgical non-interventional candidate.  He is therefore very high risk for any vascular surgery.  Given this finding I elected to move forward with angioplasty of the common femoral artery itself.  A 6 mm x 40 mm ultra score balloon was advanced across the common femoral lesion and angioplasty was performed to 12 atm for 1 minute.  Follow-up imaging demonstrated a smooth result with approximately 15 to 20% residual stenosis.  I then proceeded with treatment of the SFA disease.  KMP  catheter and stiff angle Glidewire were then negotiated down into the distal popliteal.  Distal runoff was then completed by hand injection through the catheter. The wire was then reintroduced and a 5 mm x 20 cm ultra score balloon was used to angioplasty the superficial femoral and popliteal arteries. Inflations were to 12 atmospheres for 1 minutes.  Subsequently a 6 mm x 30 cm ultra score balloon was used again inflating the balloon to 12 atm for 1 minute multiple inflations were required a beginning at the above-knee popliteal and extending through the common femoral.  Follow-up imaging demonstrated greater than 50% residual stenosis and therefore a 7 mm x 150 mm life stent was deployed from the at knee popliteal more proximally and then a 7 mm x 30 mm life stent was added to complete the coverage of the residual disease.  Subsequently postdilated with a 6 mm x 22 cm Lutonix drug-eluting balloon 2 of these were required and the balloon inflation was extended into the common femoral as well to deliver medication to this portion of the artery also.. Distal runoff was then  reassessed.  After review of these images the sheath is pulled into the left external iliac oblique of the common femoral is obtained and a Star close device deployed. There no immediate complications.   Findings: The abdominal aorta is opacified with a bolus injection contrast. The aorta itself has diffuse disease but no hemodynamically significant lesions. The common and external iliac arteries are widely patent on the right.  The left common iliac artery is patent.  The left external iliac artery is almost occluded with a greater than 95% stenosis in its proximal portion.  After angioplasty to 7 mm there is approximately 20% residual stenosis.   The right common femoral is almost occluded, the right profunda femoris is heavily diseased.  The SFA does indeed have a significant stenosis at multiple locations and at [facility] canal extending into the above-knee popliteal there is a subtotal occlusion.  The distal popliteal demonstrates mild to moderate disease and the trifurcation is patent with occlusion of the anterior tibial but patency of the posterior tibial and peroneal all the way to the foot.  Angioplasty and stent placement of the SFA and popliteal artery at [place]'s canal yields an excellent result with less than 10% residual stenosis.  Angioplasty of the right common femoral artery yields a good result with 15 to 20% residual stenosis.  Angioplasty of the left external iliac artery yields a good result with approximately 20% residual stenosis.    Summary: Successful recanalization  right lower extremity for limb salvage    Disposition: Patient was taken to the recovery room in stable condition having tolerated the procedure well.  [name] [name], [name] [name] 01/06/2018,8:17 PM

## 2018-01-06 NOTE — Progress Notes (Signed)
Per Shirlean Mylar, RN Specials, hold patient's insulin until after procedure.

## 2018-01-06 NOTE — Plan of Care (Signed)
  Problem: Nutrition: Goal: Adequate nutrition will be maintained Outcome: Progressing   Problem: Coping: Goal: Level of anxiety will decrease Outcome: Progressing   Problem: Elimination: Goal: Will not experience complications related to urinary retention Outcome: Progressing   Problem: Pain Managment: Goal: General experience of comfort will improve Outcome: Progressing   Problem: Safety: Goal: Ability to remain free from injury will improve Outcome: Progressing   Problem: Education: Goal: Knowledge of General Education information will improve Description Including pain rating scale, medication(s)/side effects and non-pharmacologic comfort measures Outcome: Completed/Met

## 2018-01-06 NOTE — Progress Notes (Signed)
Daily Progress Note   Subjective  - 2 Days Post-Op  Right foot I & D.  a  Objective Vitals:   01/06/18 0220 01/06/18 0416 01/06/18 0739 01/06/18 0745  BP:  131/80  134/80  Pulse:  82  84  Resp:    16  Temp:  98.2 F (36.8 C)    TempSrc:  Oral    SpO2: 97% 94% 94% 100%  Weight:      Height:        Physical Exam: Dorsal erythema is markedly improved. Still scant purulence today.  Edema is minimal  Plantar wound vac changed.  5x3 cm.  Exposed bone.  Packed with vancomycin Ca sulfate beads.  Laboratory CBC    Component Value Date/Time   WBC 12.9 (H) 01/06/2018 0428   HGB 10.2 (L) 01/06/2018 0428   HGB 15.0 06/08/2014 1037   HCT 29.7 (L) 01/06/2018 0428   HCT 44.5 06/08/2014 1037   PLT 337 01/06/2018 0428   PLT 261 06/08/2014 1037    BMET    Component Value Date/Time   NA 139 01/06/2018 0428   NA 136 06/08/2014 1037   K 4.6 01/06/2018 0428   K 4.3 06/08/2014 1037   CL 102 01/06/2018 0428   CL 101 06/08/2014 1037   CO2 31 01/06/2018 0428   CO2 27 06/08/2014 1037   GLUCOSE 341 (H) 01/06/2018 0428   GLUCOSE 415 (H) 06/08/2014 1037   BUN 46 (H) 01/06/2018 0428   BUN 9 06/08/2014 1037   CREATININE 1.09 01/06/2018 0428   CREATININE 1.08 06/08/2014 1037   CALCIUM 8.7 (L) 01/06/2018 0428   CALCIUM 9.2 06/08/2014 1037   GFRNONAA >60 01/06/2018 0428   GFRNONAA >60 06/08/2014 1037   GFRNONAA >60 02/17/2014 0909   GFRAA >60 01/06/2018 0428   GFRAA >60 06/08/2014 1037   GFRAA >60 02/17/2014 0909    Assessment/Planning: Osteomyelitis with sepsis PVD   Dressing changed.  Will need wound vac upon d/c.  IV abx per IM.  REcommend PICC and ID consult.  To angio today.  Will follow  [name] [name]  01/06/2018, 8:01 AM

## 2018-01-06 NOTE — Progress Notes (Signed)
Progress Note  Patient Name: [name] [name] Date of Encounter: 01/06/2018  Primary Cardiologist:  Dr. Rich Fuchs - [facility]  Subjective   No complaints.  No shortness of breath, chest pain, or edema.  Planning for lower extremity angiography and possible angioplasty this afternoon.  Inpatient Medications    Scheduled Meds: . aspirin EC  81 mg Oral Daily  . atorvastatin  80 mg Oral Daily  . carvedilol  6.25 mg Oral BID  . clopidogrel  75 mg Oral Daily  . enoxaparin (LOVENOX) injection  40 mg Subcutaneous Q24H  . gabapentin  400 mg Oral BID  . insulin aspart  0-20 Units Subcutaneous TID AC & HS  . insulin aspart  6 Units Subcutaneous TID WC  . insulin glargine  60 Units Subcutaneous QHS  . ipratropium-albuterol  3 mL Nebulization Q6H  . multivitamin-lutein  1 capsule Oral Daily  . nicotine  21 mg Transdermal Daily  . nutrition supplement (JUVEN)  1 packet Oral BID BM  . protein supplement shake  11 oz Oral BID BM  . traZODone  50 mg Oral QHS   Continuous Infusions: . sodium chloride 50 mL/hr at 01/06/18 0846  . ampicillin (OMNIPEN) IV Stopped (01/06/18 0845)   PRN Meds: acetaminophen **OR** acetaminophen, ALPRAZolam, diphenhydrAMINE, morphine injection, ondansetron **OR** ondansetron (ZOFRAN) IV, oxyCODONE   Vital Signs    Vitals:   01/06/18 0220 01/06/18 0416 01/06/18 0739 01/06/18 0745  BP:  131/80  134/80  Pulse:  82  84  Resp:    16  Temp:  98.2 F (36.8 C)    TempSrc:  Oral    SpO2: 97% 94% 94% 100%  Weight:      Height:        Intake/Output Summary (Last 24 hours) at 01/06/2018 1047 Last data filed at 01/06/2018 0709 Gross per 24 hour  Intake 880 ml  Output 1810 ml  Net -930 ml   Filed Weights   01/02/18 2213 01/03/18 0337 01/04/18 0254  Weight: 237 lb (107.5 kg) 231 lb 14.4 oz (105.2 kg) 229 lb 3.2 oz (104 kg)    Telemetry    Normal sinus rhythm with PVCs and PACs.  12 beat run of nonsustained ventricular tachycardia - Personally Reviewed  ECG      No new tracing  Physical Exam   GEN: No acute distress.   Neck: No JVD Cardiac: RRR, no murmurs, rubs, or gallops.  Respiratory: Clear to auscultation bilaterally. GI: Soft, nontender, non-distended  MS: No edema; No deformity.  Right foot wrapped with gauze.  Wound VAC in place. Neuro:  Nonfocal  Psych: Normal affect   Labs    Chemistry Recent Labs  Lab 01/02/18 2153  01/04/18 0031 01/05/18 0413 01/06/18 0428  NA 125*   < > 127* 133* 139  K 5.4*   < > 5.2* 4.7 4.6  CL 86*   < > 93* 98 102  CO2 30   < > 27 29 31   GLUCOSE 472*   < > 364* 363* 341*  BUN 33*   < > 39* 54* 46*  CREATININE 2.02*   < > 1.48* 1.46* 1.09  CALCIUM 8.5*   < > 8.5* 8.5* 8.7*  PROT 7.1  --   --   --   --   ALBUMIN 2.6*  --   --   --   --   AST 62*  --   --   --   --   ALT 26  --   --   --   --  ALKPHOS 60  --   --   --   --   BILITOT 1.1  --   --   --   --   GFRNONAA 35*   < > 52* 52* >60  GFRAA 41*   < > 60* >60 >60  ANIONGAP 9   < > 7 6 6    < > = values in this interval not displayed.     Hematology Recent Labs  Lab 01/04/18 0031 01/05/18 0413 01/06/18 0428  WBC 17.6* 11.6* 12.9*  RBC 3.13* 3.53* 3.52*  HGB 8.8* 10.0* 10.2*  HCT 26.0* 29.4* 29.7*  MCV 83.3 83.1 84.2  MCH 28.0 28.4 29.1  MCHC 33.6 34.2 34.5  RDW 18.0* 18.4* 18.4*  PLT 271 316 337    Cardiac Enzymes Recent Labs  Lab 01/03/18 0453 01/03/18 1026 01/03/18 1654 01/04/18 0031  TROPONINI 3.09* 1.95* 2.49* 2.17*   No results for input(s): TROPIPOC in the last 168 hours.   BNPNo results for input(s): BNP, PROBNP in the last 168 hours.   DDimer No results for input(s): DDIMER in the last 168 hours.   Radiology    No results found.  Cardiac Studies   Echo 11/21/17- EF 25-30%   Patient Profile     57 y.o. male CAD with known 3V CAD at cath Nov 2018 and the recommendation was for CVTS evaluation. The pateint went to [facility] fo a second opinion and is being followed by Dr Rich Fuchs. CABG is being considered  if the pt can quit smoking (currently 2 PPD) and his PAD stabilizes. 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 he was to have a f/u echo in August. He has chronic LE claudication and known PVD s/p prior intervention. He has a chronic Rt foot ulcer and was admitted 01/03/18 with sepsis complicated by elevated troponin.  Assessment & Plan    Demand ischemia I suspect elevated troponin was secondary to supply-demand mismatch in the setting of sepsis from right foot wound and known severe three-vessel coronary artery disease.  He is asymptomatic at this time.  I agree with continuing medical therapy and follow-up as an outpatient at [facility].  Continue dual antiplatelet therapy with aspirin and clopidogrel.  Continue high intensity statin therapy.  Continue carvedilol 6.25 mg twice daily.  Chronic systolic heart failure secondary to ischemic cardiomyopathy Mr. Meriwether appears euvolemic on exam.  Continue carvedilol 6.25 mg twice daily.  Creatinine continues to improve.  Consider restarting lisinopril if renal function is stable after peripheral angiography.  No standing diuresis at this time, though volume status will need to be closely watched in the setting of hydration with angiography and severely reduced LVEF.  Peripheral vascular disease and right foot wound  Continue care per vascular surgery and podiatry.  Sepsis Improved.  Continue antimicrobial therapy per hospitalist service and wound care/revascularization per vascular surgery and podiatry.  [facility] will sign off.   Medication Recommendations: Continue current medications with close volume monitoring.  Restart lisinopril prior to discharge if renal function remains at baseline. Other recommendations (labs, testing, etc): Follow-up BMP after peripheral angiography. Follow up as an outpatient: Outpatient follow-up with primary cardiologist at [facility] in 1 to 2 weeks after discharge.  For questions or updates,  please contact [place] Please consult [email or web address] for contact info under [facility].     Signed, Nelva Bush, MD  01/06/2018, 10:47 AM

## 2018-01-07 ENCOUNTER — Inpatient Hospital Stay: Payer: Self-pay

## 2018-01-07 ENCOUNTER — Encounter: Payer: Self-pay | Admitting: Vascular Surgery

## 2018-01-07 LAB — CBC
HCT: 30.9 % — ABNORMAL LOW (ref 40.0–52.0)
Hemoglobin: 10.6 g/dL — ABNORMAL LOW (ref 13.0–18.0)
MCH: 28.7 pg (ref 26.0–34.0)
MCHC: 34.3 g/dL (ref 32.0–36.0)
MCV: 83.5 fL (ref 80.0–100.0)
Platelets: 338 10*3/uL (ref 150–440)
RBC: 3.71 MIL/uL — ABNORMAL LOW (ref 4.40–5.90)
RDW: 18.8 % — ABNORMAL HIGH (ref 11.5–14.5)
WBC: 12.7 10*3/uL — ABNORMAL HIGH (ref 3.8–10.6)

## 2018-01-07 LAB — BASIC METABOLIC PANEL
Anion gap: 6 (ref 5–15)
BUN: 29 mg/dL — ABNORMAL HIGH (ref 6–20)
CO2: 29 mmol/L (ref 22–32)
Calcium: 8.4 mg/dL — ABNORMAL LOW (ref 8.9–10.3)
Chloride: 107 mmol/L (ref 98–111)
Creatinine, Ser: 0.89 mg/dL (ref 0.61–1.24)
GFR calc Af Amer: >60 mL/min (ref >60)
GFR calc non Af Amer: >60 mL/min (ref >60)
Glucose, Bld: 180 mg/dL — ABNORMAL HIGH (ref 70–99)
Potassium: 3.9 mmol/L (ref 3.5–5.1)
Sodium: 142 mmol/L (ref 135–145)

## 2018-01-07 LAB — GLUCOSE, CAPILLARY
Glucose-Capillary: 195 mg/dL — ABNORMAL HIGH (ref 70–99)
Glucose-Capillary: 136 mg/dL — ABNORMAL HIGH (ref 70–99)
Glucose-Capillary: 159 mg/dL — ABNORMAL HIGH (ref 70–99)
Glucose-Capillary: 126 mg/dL — ABNORMAL HIGH (ref 70–99)
Glucose-Capillary: 176 mg/dL — ABNORMAL HIGH (ref 70–99)

## 2018-01-07 MED ORDER — IPRATROPIUM-ALBUTEROL 0.5-2.5 (3) MG/3ML IN SOLN: 3.0000 mL | Freq: Four times a day (QID) | RESPIRATORY_TRACT | Status: DC | PRN

## 2018-01-07 MED ORDER — SODIUM CHLORIDE 0.9% FLUSH: 10.0000 mL | INTRAVENOUS | Status: DC | PRN

## 2018-01-07 MED ORDER — LISINOPRIL 5 MG PO TABS: 5.0000 mg | ORAL_TABLET | Freq: Every day | ORAL | Status: DC

## 2018-01-07 MED ORDER — SODIUM CHLORIDE 0.9% FLUSH
10.0000 mL | Freq: Two times a day (BID) | INTRAVENOUS | Status: DC
  Administered 2018-01-07 – 2018-01-09 (×4): 10 mL

## 2018-01-07 NOTE — Progress Notes (Addendum)
[place] at [facility]                                                                                                                                                                                  Patient Demographics   [name] [name], is a 57 y.o. male, DOB - 06-30-1960, JJO:[id number]  Admit date - 01/02/2018   Admitting Physician Lance Coon, MD  Outpatient Primary MD for the patient is Clarisse Gouge, MD   LOS - 5  Subjective: Patient has right foot pain and some drainage from wound.   Review of Systems:   CONSTITUTIONAL: No documented fever. No fatigue, weakness. No weight gain, no weight loss.  EYES: No blurry or double vision.  ENT: No tinnitus. No postnasal drip. No redness of the oropharynx.  RESPIRATORY: No cough, no wheeze, no hemoptysis. No dyspnea.  CARDIOVASCULAR: No chest pain. No orthopnea. No palpitations. No syncope.  GASTROINTESTINAL: No nausea, no vomiting or diarrhea. No abdominal pain. No melena or hematochezia.  GENITOURINARY: No dysuria or hematuria.  ENDOCRINE: No polyuria or nocturia. No heat or cold intolerance.  HEMATOLOGY: No anemia. No bruising. No bleeding.  INTEGUMENTARY: No rashes. No lesions.  MUSCULOSKELETAL: Positive right foot pain NEUROLOGIC: No numbness, tingling, or ataxia. No seizure-type activity.  PSYCHIATRIC: No anxiety. No insomnia. No ADD.    Vitals:   Vitals:   01/06/18 1950 01/07/18 0314 01/07/18 0852 01/07/18 1525  BP: (!) 141/77 124/74  130/81  Pulse: 96 93  (!) 103  Resp: 18 18  18   Temp: 98.2 F (36.8 C) 98.6 F (37 C)  98.3 F (36.8 C)  TempSrc: Oral Oral  Oral  SpO2: 98% 95% 95% 99%  Weight:      Height:        Wt Readings from Last 3 Encounters:  01/06/18 229 lb (103.9 kg)  12/23/17 233 lb 7.5 oz (105.9 kg)  11/23/17 230 lb 4.8 oz (104.5 kg)     Intake/Output Summary (Last 24 hours) at 01/07/2018 1747 Last data filed at 01/07/2018 1515 Gross per 24 hour  Intake 3294.16 ml   Output 2200 ml  Net 1094.16 ml    Physical Exam:   GENERAL: Pleasant-appearing in no apparent distress.  Obesity. HEAD, EYES, EARS, NOSE AND THROAT: Atraumatic, normocephalic. Extraocular muscles are intact. Pupils equal and reactive to light. Sclerae anicteric. No conjunctival injection. No oro-pharyngeal erythema.  NECK: Supple. There is no jugular venous distention. No bruits, no lymphadenopathy, no thyromegaly.  HEART: Regular rate and rhythm,. No murmurs, no rubs, no clicks.  LUNGS: Clear to auscultation bilaterally. No rales or rhonchi. No wheezes.  ABDOMEN: Soft, flat, nontender, nondistended. Has good  bowel sounds. No hepatosplenomegaly appreciated.  EXTREMITIES: He has dressing in place on the right foot NEUROLOGIC: The patient is alert, awake, and oriented x3 with no focal motor or sensory deficits appreciated bilaterally.  SKIN: Moist and warm with no rashes appreciated.  Psych: Not anxious, depressed LN: No inguinal LN enlargement    Antibiotics   Anti-infectives (From admission, onward)   Start     Dose/Rate Route Frequency Ordered Stop   01/06/18 1700  metroNIDAZOLE (FLAGYL) tablet 500 mg     500 mg Oral Every 8 hours 01/06/18 1604     01/06/18 1400  clindamycin (CLEOCIN) IVPB 300 mg     300 mg 100 mL/hr over 30 Minutes Intravenous  Once 01/06/18 1229 01/06/18 1340   01/06/18 1230  clindamycin (CLEOCIN) 300 MG/50ML IVPB  Status:  Discontinued    Note to Pharmacy:  Despina Arias  : cabinet override      01/06/18 1230 01/06/18 1242   01/05/18 1800  ampicillin (OMNIPEN) 1 g in sodium chloride 0.9 % 100 mL IVPB  Status:  Discontinued     1 g 300 mL/hr over 20 Minutes Intravenous Every 6 hours 01/05/18 1449 01/05/18 1614   01/05/18 1700  ampicillin (OMNIPEN) 2 g in sodium chloride 0.9 % 100 mL IVPB     2 g 300 mL/hr over 20 Minutes Intravenous Every 4 hours 01/05/18 1614     01/04/18 2200  ceFEPIme (MAXIPIME) 2 g in sodium chloride 0.9 % 100 mL IVPB  Status:   Discontinued     2 g 200 mL/hr over 30 Minutes Intravenous Every 8 hours 01/04/18 1321 01/05/18 1457   01/04/18 1400  vancomycin (VANCOCIN) IVPB 1000 mg/200 mL premix  Status:  Discontinued     1,000 mg 200 mL/hr over 60 Minutes Intravenous Every 12 hours 01/04/18 1318 01/05/18 1457   01/03/18 1000  ceFEPIme (MAXIPIME) 2 g in sodium chloride 0.9 % 100 mL IVPB  Status:  Discontinued     2 g 200 mL/hr over 30 Minutes Intravenous Every 12 hours 01/03/18 0052 01/04/18 1321   01/03/18 0600  vancomycin (VANCOCIN) IVPB 1000 mg/200 mL premix  Status:  Discontinued     1,000 mg 200 mL/hr over 60 Minutes Intravenous Every 18 hours 01/03/18 0052 01/04/18 1318   01/02/18 2230  metroNIDAZOLE (FLAGYL) IVPB 500 mg     500 mg 100 mL/hr over 60 Minutes Intravenous  Once 01/02/18 2227 01/03/18 0032   01/02/18 2145  ceFEPIme (MAXIPIME) 2 g in sodium chloride 0.9 % 100 mL IVPB     2 g 200 mL/hr over 30 Minutes Intravenous  Once 01/02/18 2133 01/02/18 2310   01/02/18 2145  vancomycin (VANCOCIN) IVPB 1000 mg/200 mL premix     1,000 mg 200 mL/hr over 60 Minutes Intravenous  Once 01/02/18 2133 01/02/18 2335      Medications   Scheduled Meds: . aspirin EC  81 mg Oral Daily  . atorvastatin  80 mg Oral Daily  . carvedilol  6.25 mg Oral BID  . clopidogrel  75 mg Oral Daily  . enoxaparin (LOVENOX) injection  40 mg Subcutaneous Q24H  . gabapentin  400 mg Oral BID  . insulin aspart  0-20 Units Subcutaneous TID AC & HS  . insulin aspart  6 Units Subcutaneous TID WC  . insulin glargine  60 Units Subcutaneous QHS  . metroNIDAZOLE  500 mg Oral Q8H  . multivitamin-lutein  1 capsule Oral Daily  . nicotine  21 mg Transdermal Daily  .  nutrition supplement (JUVEN)  1 packet Oral BID BM  . protein supplement shake  11 oz Oral BID BM  . sodium chloride flush  10-40 mL Intracatheter Q12H  . traZODone  50 mg Oral QHS   Continuous Infusions: . sodium chloride 50 mL/hr at 01/07/18 0509  . ampicillin (OMNIPEN) IV 2 g  (01/07/18 1740)   PRN Meds:.acetaminophen **OR** acetaminophen, ALPRAZolam, diphenhydrAMINE, ipratropium-albuterol, morphine injection, ondansetron **OR** ondansetron (ZOFRAN) IV, oxyCODONE, sodium chloride flush   Data Review:   Micro Results Recent Results (from the past 240 hour(s))  Blood Culture (routine x 2)     Status: Abnormal   Collection Time: 01/02/18  9:53 PM  Result Value Ref Range Status   Specimen Description   Final    BLOOD LEFT WRIST Performed at [facility], [address], [place], [place] [address]    Special Requests   Final    BOTTLES DRAWN AEROBIC AND ANAEROBIC Blood Culture results may not be optimal due to an inadequate volume of blood received in culture bottles Performed at [facility], [address], [name], [place] [address]    Culture  Setup Time   Final    GRAM POSITIVE COCCI ANAEROBIC BOTTLE ONLY CRITICAL VALUE NOTED.  VALUE IS CONSISTENT WITH PREVIOUSLY REPORTED AND CALLED VALUE. Performed at [facility], [address] [place], [place] [address]    Culture (A)  Final    VIRIDANS STREPTOCOCCUS THE SIGNIFICANCE OF ISOLATING THIS ORGANISM FROM A SINGLE SET OF BLOOD CULTURES WHEN MULTIPLE SETS ARE DRAWN IS UNCERTAIN. PLEASE NOTIFY THE MICROBIOLOGY DEPARTMENT WITHIN ONE WEEK IF SPECIATION AND SENSITIVITIES ARE REQUIRED. Performed at [facility], [place] [address] [place], [place] [address]    Report Status 01/06/2018 FINAL  Final  Blood Culture (routine x 2)     Status: Abnormal   Collection Time: 01/02/18  9:53 PM  Result Value Ref Range Status   Specimen Description   Final    BLOOD LEFT ANTECUBITAL Performed at [facility], [address] [place], [place] [address]    Special Requests   Final    BOTTLES DRAWN AEROBIC AND ANAEROBIC Blood Culture results may not be optimal due to an inadequate volume of blood received in culture bottles Performed at [facility], [address] [place], [place] [address]    Culture  Setup Time   Final    GRAM POSITIVE COCCI IN BOTH AEROBIC AND ANAEROBIC BOTTLES CRITICAL RESULT CALLED TO, READ BACK BY AND VERIFIED WITH: Dicie Beam 01/03/18 @ 1055  [place] Performed at [facility], [place] [address], [place], [place] [address]    Culture GROUP B STREP(S.AGALACTIAE)ISOLATED (A)  Final   Report Status 01/05/2018 FINAL  Final   Organism ID, Bacteria GROUP B STREP(S.AGALACTIAE)ISOLATED  Final      Susceptibility   Group b strep(s.agalactiae)isolated - MIC*    CLINDAMYCIN >=1 RESISTANT Resistant     AMPICILLIN <=0.25 SENSITIVE Sensitive     ERYTHROMYCIN >=8 RESISTANT Resistant     VANCOMYCIN 0.5 SENSITIVE Sensitive     CEFTRIAXONE <=0.12 SENSITIVE Sensitive     LEVOFLOXACIN 1 SENSITIVE Sensitive     PENICILLIN Value in next row Sensitive      SENSITIVE0.12    * GROUP B STREP(S.AGALACTIAE)ISOLATED  Blood Culture ID Panel (Reflexed)     Status: Abnormal   Collection Time: 01/02/18  9:53 PM  Result Value Ref Range Status   Enterococcus species NOT DETECTED NOT DETECTED Final  Listeria monocytogenes NOT DETECTED NOT DETECTED Final   Staphylococcus species NOT DETECTED NOT DETECTED Final   Staphylococcus aureus NOT DETECTED NOT DETECTED Final   Streptococcus species DETECTED (A) NOT DETECTED Final    Comment: CRITICAL RESULT CALLED TO, READ BACK BY AND VERIFIED WITH: TELDRIN JAMES 01/03/18 @ 1055  [place]    Streptococcus agalactiae DETECTED (A) NOT DETECTED Final    Comment: CRITICAL RESULT CALLED TO, READ BACK BY AND VERIFIED WITH: TELDRIN JAMES 01/03/18 @ 1055  [place]    Streptococcus pneumoniae NOT DETECTED NOT DETECTED Final   Streptococcus pyogenes NOT DETECTED NOT DETECTED Final   Acinetobacter baumannii NOT DETECTED NOT DETECTED Final   Enterobacteriaceae species NOT DETECTED NOT DETECTED Final   Enterobacter cloacae complex NOT DETECTED NOT DETECTED Final   Escherichia coli NOT DETECTED NOT DETECTED Final   Klebsiella  oxytoca NOT DETECTED NOT DETECTED Final   Klebsiella pneumoniae NOT DETECTED NOT DETECTED Final   Proteus species NOT DETECTED NOT DETECTED Final   Serratia marcescens NOT DETECTED NOT DETECTED Final   Haemophilus influenzae NOT DETECTED NOT DETECTED Final   Neisseria meningitidis NOT DETECTED NOT DETECTED Final   Pseudomonas aeruginosa NOT DETECTED NOT DETECTED Final   Candida albicans NOT DETECTED NOT DETECTED Final   Candida glabrata NOT DETECTED NOT DETECTED Final   Candida krusei NOT DETECTED NOT DETECTED Final   Candida parapsilosis NOT DETECTED NOT DETECTED Final   Candida tropicalis NOT DETECTED NOT DETECTED Final    Comment: Performed at [facility], [address] [place], [place] [address]  Aerobic/Anaerobic Culture (surgical/deep wound)     Status: None (Preliminary result)   Collection Time: 01/03/18  3:49 AM  Result Value Ref Range Status   Specimen Description   Final    WOUND RIGHT FOOT Performed at [facility], [address] [place], [place] [address]    Special Requests   Final    NONE Performed at [facility], [place], [place] [address]    Gram Stain   Final    RARE WBC PRESENT, PREDOMINANTLY PMN ABUNDANT GRAM POSITIVE COCCI IN PAIRS IN CLUSTERS FEW GRAM NEGATIVE RODS    Culture   Final    ABUNDANT GROUP B STREP(S.AGALACTIAE)ISOLATED TESTING AGAINST S. AGALACTIAE NOT ROUTINELY PERFORMED DUE TO PREDICTABILITY OF AMP/PEN/VAN SUSCEPTIBILITY. HOLDING FOR POSSIBLE ANAEROBE Performed at [facility], [place] [address] [place], [place] [address]    Report Status PENDING  Incomplete  Aerobic/Anaerobic Culture (surgical/deep wound)     Status: None (Preliminary result)   Collection Time: 01/04/18  8:59 AM  Result Value Ref Range Status   Specimen Description WOUND RIGHT FOOT  Final   Special Requests PATIENT ON FOLLOWING VANC CEFEPIME FLAGYL  Final   Gram Stain   Final    FEW WBC PRESENT, PREDOMINANTLY  PMN ABUNDANT GRAM POSITIVE COCCI IN PAIRS IN CLUSTERS Performed at [facility], [place] [address] [place], [place] [address]    Culture   Final    MODERATE GROUP B STREP(S.AGALACTIAE)ISOLATED TESTING AGAINST S. AGALACTIAE NOT ROUTINELY PERFORMED DUE TO PREDICTABILITY OF AMP/PEN/VAN SUSCEPTIBILITY. MODERATE ENTEROCOCCUS FAECALIS NO ANAEROBES ISOLATED; CULTURE IN PROGRESS FOR 5 DAYS    Report Status PENDING  Incomplete   Organism ID, Bacteria ENTEROCOCCUS FAECALIS  Final      Susceptibility   Enterococcus faecalis - MIC*    AMPICILLIN <=2 SENSITIVE Sensitive     VANCOMYCIN 1 SENSITIVE Sensitive     GENTAMICIN SYNERGY SENSITIVE Sensitive     *  MODERATE ENTEROCOCCUS FAECALIS  Urine culture     Status: None   Collection Time: 01/04/18 11:59 PM  Result Value Ref Range Status   Specimen Description   Final    URINE, RANDOM Performed at [facility], [address], [place], [place] [address]    Special Requests   Final    NONE Performed at [facility], [address], [place], [place] [address]    Culture   Final    NO GROWTH Performed at [facility], [place] [address] [place], [place] [address]    Report Status 01/06/2018 FINAL  Final  CULTURE, BLOOD (ROUTINE X 2) w Reflex to ID Panel     Status: None (Preliminary result)   Collection Time: 01/05/18  4:25 PM  Result Value Ref Range Status   Specimen Description BLOOD BLOOD RIGHT HAND  Final   Special Requests   Final    BOTTLES DRAWN AEROBIC AND ANAEROBIC Blood Culture adequate volume   Culture   Final    NO GROWTH 2 DAYS Performed at [facility], [address], [place], [place] [address]    Report Status PENDING  Incomplete  CULTURE, BLOOD (ROUTINE X 2) w Reflex to ID Panel     Status: None (Preliminary result)   Collection Time: 01/05/18  5:16 PM  Result Value Ref Range Status   Specimen Description BLOOD BLOOD RIGHT HAND  Final   Special Requests   Final    BOTTLES DRAWN  AEROBIC AND ANAEROBIC Blood Culture adequate volume   Culture   Final    NO GROWTH 2 DAYS Performed at [facility], [address], [place],  [address]    Report Status PENDING  Incomplete    Radiology Reports Dg Chest 2 View  Result Date: 01/02/2018 CLINICAL DATA:  Weakness and tremors. Gait abnormalities and memory issues. Sepsis. EXAM: CHEST - 2 VIEW COMPARISON:  12/21/2017 FINDINGS: Mild cardiac enlargement. No vascular congestion, edema, or consolidation. No blunting of costophrenic angles. No pneumothorax. Calcification of the aorta. Mediastinal contours appear intact. Degenerative changes in the spine and shoulders. Old resection or resorption of the distal right clavicle. IMPRESSION: Mild cardiac enlargement. No evidence of active pulmonary disease. Aortic atherosclerosis. Electronically Signed   By: Lucienne Capers M.D.   On: 01/02/2018 21:51   Dg Chest 2 View  Result Date: 12/21/2017 CLINICAL DATA:  Patient reports upper chest pain (points to base of neck) all day that has gotten worse over the days. Hx/o asthma, carotid arterial disease, CHF, COPD, PAD, and diabetes Smoker EXAM: CHEST - 2 VIEW COMPARISON:  11/21/2017 FINDINGS: Mild hyperinflation. Midline trachea. Normal heart size. Atherosclerosis in the transverse aorta. No pleural effusion or pneumothorax. Diffuse peribronchial thickening. Clear lungs. IMPRESSION: No acute cardiopulmonary disease. Hyperinflation and mild interstitial thickening, most consistent with COPD/chronic bronchitis. Aortic Atherosclerosis (ICD10-I70.0). Electronically Signed   By: Abigail Miyamoto M.D.   On: 12/21/2017 23:35   Ct Chest Wo Contrast  Result Date: 12/22/2017 CLINICAL DATA:  57 year old male with chest pain and shortness of breath. EXAM: CT CHEST WITHOUT CONTRAST TECHNIQUE: Multidetector CT imaging of the chest was performed following the standard protocol without IV contrast. COMPARISON:  Chest radiograph dated 12/21/2017 and CT  dated 04/22/2017 FINDINGS: Evaluation of this exam is limited in the absence of intravenous contrast. Cardiovascular: Top-normal cardiac size. No pericardial effusion. Advanced multi vessel coronary vascular calcification. There is mild atherosclerotic calcification of the thoracic aorta. The aorta and central pulmonary arteries are  otherwise grossly unremarkable on this noncontrast CT. Mediastinum/Nodes: No hilar or mediastinal adenopathy. Esophagus and the thyroid gland are grossly unremarkable. No mediastinal fluid collection. Lungs/Pleura: Bilateral ground-glass nodular densities most consistent with pneumonia, likely atypical in etiology. Clinical correlation and follow-up to resolution recommended. No focal consolidation, pleural effusion, or pneumothorax. Upper Abdomen: No acute abnormality. Musculoskeletal: Degenerative changes of the spine. No acute osseous pathology. IMPRESSION: 1. Innumerable small ground-glass pulmonary nodules most consistent with an inflammatory/infectious etiology. Clinical correlation and follow-up to resolution recommended. 2. Advanced multi vessel coronary vascular calcification. 3.  Aortic Atherosclerosis (ICD10-I70.0). Electronically Signed   By: Anner Crete M.D.   On: 12/22/2017 05:37   Mr Foot Right Wo Contrast  Result Date: 01/03/2018 CLINICAL DATA:  Large open wound near the base of the fifth metatarsal. EXAM: MRI OF THE RIGHT FOREFOOT WITHOUT CONTRAST TECHNIQUE: Multiplanar, multisequence MR imaging of the right foot was performed. No intravenous contrast was administered. COMPARISON:  Radiograph 01/02/2018 FINDINGS: Exam is somewhat limited by patient motion and lack of IV contrast. There is a large open wound on the plantar and lateral aspect of the midfoot at the level of the base of the fifth metatarsal. Diffuse surrounding cellulitis but no discrete drainable soft tissue abscess. Diffuse abnormal T1 and T2 signal intensity in the cuboid, fifth metatarsal and  base of the fourth metatarsal. There is also fluid in the cuboid articulation with the fourth and fifth metatarsals. Findings consistent with septic arthritis and osteomyelitis. Mild edema like signal abnormality in the midfoot bony structures more medially likely related to advanced degenerative disease but osteomyelitis can't be totally excluded. The tibiotalar and subtalar joints are maintained. Small joint effusions. Diffuse myositis without definite findings for pyomyositis. IMPRESSION: 1. Large plantar and lateral open wound with associated significant cellulitis and myofasciitis. No discrete drainable soft tissue abscess or pyomyositis. 2. MR findings consistent with septic arthritis at the cuboid articulation with the fourth and fifth metatarsal bases and associated osteomyelitis. 3. Midfoot degenerative changes and edema like signal abnormality, likely degenerative but could not exclude osteomyelitis. Electronically Signed   By: Marijo Sanes M.D.   On: 01/03/2018 13:37   Mr Foot Right Wo Contrast  Result Date: 12/23/2017 CLINICAL DATA:  Diabetic patient with a chronic skin ulceration on the lateral aspect of the right foot at the base of the fifth metatarsal. EXAM: MRI OF THE RIGHT FOREFOOT WITHOUT CONTRAST TECHNIQUE: Multiplanar, multisequence MR imaging of the right forefoot was performed. No intravenous contrast was administered. COMPARISON:  Plain films right foot 12/22/2017 and 07/10/2016. MRI right foot 05/06/2016 FINDINGS: Bones/Joint/Cartilage Marrow edema in the cuboid is most intense in the lateral 2 cm. There is intense marrow edema throughout the fifth metatarsal. Marrow edema is also identified in the fourth metatarsal and most intense in the proximal 2 cm. Calcaneocuboid joint effusion is noted. Marrow edema about the remaining tarsometatarsal joints is likely related to neuropathic change. Subchondral cysts are seen at the articulation of the navicular and medial cuneiform. No fracture.  Ligaments Intact. Muscles and Tendons Atrophy of intrinsic musculature the foot is identified. No intramuscular fluid collection. Soft tissues A subcutaneous fluid collection at the base of the fifth metatarsal deep to a skin ulceration measures 0.7 cm craniocaudal by 0.4 cm transverse by 1.1 cm long. IMPRESSION: Skin ulceration at the base of the fifth metatarsal with marrow edema in the lateral 2 cm of the cuboid and throughout the fifth metatarsal consistent with osteomyelitis. Marrow edema is also seen throughout almost the entire  fourth metatarsal but most intense in the proximal 2 cm where it is worrisome for osteomyelitis. Edema in the remainder of the fourth metatarsal may be reactive. Calcaneocuboid joint effusion worrisome for septic joint. Small fluid collection lateral to the base of the fifth metatarsal is likely an abscess. Charcot change of the midfoot. Electronically Signed   By: Inge Rise M.D.   On: 12/23/2017 12:23   Dg Chest Port 1 View  Result Date: 01/03/2018 CLINICAL DATA:  Acute shortness of breath for 2 days. EXAM: PORTABLE CHEST 1 VIEW COMPARISON:  01/02/2018 FINDINGS: Cardiomegaly and pulmonary vascular congestion again noted. Interstitial prominence is unchanged. There is no evidence of focal airspace disease, pulmonary edema, suspicious pulmonary nodule/mass, pleural effusion, or pneumothorax. No acute bony abnormalities are identified. IMPRESSION: Cardiomegaly with pulmonary vascular congestion. Interstitial prominence may represent mild interstitial edema. Electronically Signed   By: Margarette Canada M.D.   On: 01/03/2018 15:46   Dg Foot Complete Right  Result Date: 01/02/2018 CLINICAL DATA:  Diabetic ulcer at the base of the fifth metatarsal bone. EXAM: RIGHT FOOT COMPLETE - 3+ VIEW COMPARISON:  Right foot radiograph 12/22/2017. MRI right foot 12/23/2017 FINDINGS: Large soft tissue ulceration over the plantar surface of the lateral right foot at the level of the fifth  metatarsal base. This was present on the previous study. In the interval, there is development of soft tissue gas along the dorsum of the right foot. This likely reflects progression of infection with gas-forming organism. No definite radiographic changes to suggest osteomyelitis, but changes of osteomyelitis were demonstrated in the fourth and fifth metatarsal bones on the previous MRI. Degenerative changes in the interphalangeal and intertarsal joints. Plantar calcaneal spur. No radiopaque soft tissue foreign bodies. IMPRESSION: Large soft tissue ulceration over the plantar surface of the right foot at the level of the fifth metatarsal base as seen previously. New soft tissue gas along the dorsum of the right foot likely representing progression of infection with gas-forming organism. No radiographic evidence of osteomyelitis, but previous MRI showed evidence of osteomyelitis in the fourth and fifth metatarsal bones. Electronically Signed   By: Lucienne Capers M.D.   On: 01/02/2018 22:46   Dg Foot Complete Right  Result Date: 12/22/2017 CLINICAL DATA:  57 year old male with diabetic ulcer on the plantar right foot. EXAM: RIGHT FOOT COMPLETE - 3+ VIEW COMPARISON:  Right foot radiograph dated 07/10/2016 FINDINGS: There is no acute fracture or dislocation. Degenerative changes of the midfoot primarily involving the third tarsometatarsal joint. No periosteal reaction or erosive changes to suggest acute osteomyelitis. There is a large ulcer of the lateral aspect of the plantar soft tissues of the midfoot over the base of the fifth metatarsal which appears larger compared to the prior radiograph. There is diffuse soft tissue edema. No radiopaque foreign object. IMPRESSION: 1. Large soft tissue ulcer at the lateral plantar midfoot. 2. No acute fracture or dislocation. No radiographic evidence of acute osteomyelitis. MRI or a WBC nuclear scan may provide better evaluation if there is high clinical concern for acute  osteomyelitis. Electronically Signed   By: Anner Crete M.D.   On: 12/22/2017 05:42   [place] Ekg Site Rite  Result Date: 01/07/2018 If Site Rite image not attached, placement could not be confirmed due to current cardiac rhythm.    CBC Recent Labs  Lab 01/02/18 2153 01/03/18 0453 01/04/18 0031 01/05/18 0413 01/06/18 0428 01/07/18 0501  WBC 20.6* 17.7* 17.6* 11.6* 12.9* 12.7*  HGB 9.0* 8.7* 8.8* 10.0* 10.2* 10.6*  HCT 26.7* 25.1* 26.0* 29.4* 29.7* 30.9*  PLT 290 278 271 316 337 338  MCV 83.9 83.8 83.3 83.1 84.2 83.5  MCH 28.1 28.9 28.0 28.4 29.1 28.7  MCHC 33.5 34.5 33.6 34.2 34.5 34.3  RDW 18.4* 18.0* 18.0* 18.4* 18.4* 18.8*  LYMPHSABS 0.8*  --   --   --   --   --   MONOABS 1.5*  --   --   --   --   --   EOSABS 0.0  --   --   --   --   --   BASOSABS 0.0  --   --   --   --   --     Chemistries  Recent Labs  Lab 01/02/18 2153 01/03/18 0453 01/04/18 0031 01/05/18 0413 01/06/18 0428 01/07/18 0501  NA 125* 129* 127* 133* 139 142  K 5.4* 4.4 5.2* 4.7 4.6 3.9  CL 86* 92* 93* 98 102 107  CO2 30 29 27 29 31 29   GLUCOSE 472* 360* 364* 363* 341* 180*  BUN 33* 34* 39* 54* 46* 29*  CREATININE 2.02* 1.84* 1.48* 1.46* 1.09 0.89  CALCIUM 8.5* 8.2* 8.5* 8.5* 8.7* 8.4*  MG  --   --   --   --  2.3  --   AST 62*  --   --   --   --   --   ALT 26  --   --   --   --   --   ALKPHOS 60  --   --   --   --   --   BILITOT 1.1  --   --   --   --   --    ------------------------------------------------------------------------------------------------------------------ estimated creatinine clearance is 107.7 mL/min (by C-G formula based on SCr of 0.89 mg/dL). ------------------------------------------------------------------------------------------------------------------ No results for input(s): HGBA1C in the last 72 hours. ------------------------------------------------------------------------------------------------------------------ No results for input(s): CHOL, HDL, LDLCALC, TRIG,  CHOLHDL, LDLDIRECT in the last 72 hours. ------------------------------------------------------------------------------------------------------------------ No results for input(s): TSH, T4TOTAL, T3FREE, THYROIDAB in the last 72 hours.  Invalid input(s): FREET3 ------------------------------------------------------------------------------------------------------------------ No results for input(s): VITAMINB12, FOLATE, FERRITIN, TIBC, IRON, RETICCTPCT in the last 72 hours.  Coagulation profile Recent Labs  Lab 01/02/18 2153  INR 1.21    No results for input(s): DDIMER in the last 72 hours.  Cardiac Enzymes Recent Labs  Lab 01/03/18 1026 01/03/18 1654 01/04/18 0031  TROPONINI 1.95* 2.49* 2.17*   ------------------------------------------------------------------------------------------------------------------ Invalid input(s): POCBNP    Assessment & Plan  Patient's 57 year old with left foot wound recent admission for sepsis related to pneumonia and foot infection   #1  Sepsis ([place]) due to foot infection and bacteremia.   Blood cultures positive for streptococcus agalactia in 1 blood culture positive for strep viridans Continue ampicillin IV every 4 hours. appreciate podiatry input status post excision of the fifth metatarsal and fourth metatarsal base with incision and drainage of the dorsal right midfoot which surgical cultures.  Status post angiogram and angioplasty, stent placement right superficial femoral artery and above-knee popliteal.  Per ID physician Dr. Flossie Dibble, the patient need IV antibiotics and penicillin or penicillin for 4 weeks.  PICC line placement.   #2 Coronary artery disease -with possible non-ST MI felt to be due to stress related to his sepsis patient has three-vessel disease and awaiting improvement from his medical conditions prior to CABG. continue aspirin, Plavix, Coreg and Lipitor.  #3  Diabetic foot infection ([place]) status post debridement as  above, Treatment as above.  #  4  AKI (acute kidney injury) ([place]) -improved.  #5  Hypertension -hold antihypertensives for now as the patient's blood pressure was borderline low.  Blood pressure is normal now.   #6 chronic systolic CHF LV EF: 32% -   35% -discontinue IV fluid.  Stable.  Continue Coreg, resume lisinopril.  Hold Lasix for now.  #7  Diabetes, Continue Lantus and sliding scale.  Hold metformin.    #8 hyperlipidemia -Home dose antilipid Tobacco abuse.  Smoking cessation was counseled for 3 to 4 minutes.  Nicotine patch. Discussed with  Dr. Flossie Dibble and pharmacist.    Code Status Orders  (From admission, onward)        Start     Ordered   01/03/18 0409  Full code  Continuous     01/03/18 0408    Code Status History    Date Active Date Inactive Code Status Order ID Comments User Context   12/22/2017 0607 12/23/2017 1916 Full Code [id number]  Harrie Foreman, MD Inpatient   11/21/2017 0415 11/23/2017 1444 Full Code [id number]  Lance Coon, MD Inpatient   04/23/2017 0355 04/29/2017 1353 Full Code [id number]  Saundra Shelling, MD Inpatient   07/11/2016 0324 07/11/2016 1851 Full Code [id number]  Saundra Shelling, MD Inpatient   05/06/2016 0005 05/07/2016 1417 Full Code [id number]  Harvie Bridge, DO Inpatient   11/21/2015 1716 11/22/2015 1950 Full Code [id number]  Gladstone Lighter, MD Inpatient   07/06/2015 0948 07/06/2015 1437 Full Code [id number]  Algernon Huxley, MD Inpatient           Consults podiatry, cardiology   DVT Prophylaxis  Lovenox   Lab Results  Component Value Date   PLT 338 01/07/2018     Time Spent in minutes 40 min Greater than 50% of time spent in care coordination and counseling patient regarding the condition and plan of care. Discussed with the patient and his wife.  Demetrios Loll M.D on 01/07/2018 at 5:46 PM  Between 7am to 6pm - Pager - [phone]  After 6pm go to [email or web address] - Proofreader  [employer]   Office   [phone]

## 2018-01-07 NOTE — Care Management (Signed)
Spoke with patient regarding home IV antibiotics.  He and his girlfriend who is at bedside both say there are willing and available caregivers to assist with home iv therapy.  Dr Vickki Muff informed that the wound vac order form is in the paper record for signature. At present (there is an ID consult pending) it is anticipated patient will discharge on ampicillin IV q 4 hours x 4 weeks.  Notified [facility].  Patient is to have a PICC line placed this afternoon.  Anticipate discharge within next 24-48 hours.

## 2018-01-07 NOTE — Progress Notes (Signed)
Peripherally Inserted Central Catheter/Midline Placement  The IV Nurse has discussed with the patient and/or persons authorized to consent for the patient, the purpose of this procedure and the potential benefits and risks involved with this procedure.  The benefits include less needle sticks, lab draws from the catheter, and the patient may be discharged home with the catheter. Risks include, but not limited to, infection, bleeding, blood clot (thrombus formation), and puncture of an artery; nerve damage and irregular heartbeat and possibility to perform a PICC exchange if needed/ordered by physician.  Alternatives to this procedure were also discussed.  Bard Power PICC patient education guide, fact sheet on infection prevention and patient information card has been provided to patient /or left at bedside.    PICC/Midline Placement Documentation  PICC Single Lumen 74/14/23 PICC Right Basilic 42 cm 0 cm (Active)  Indication for Insertion or Continuance of Line Prolonged intravenous therapies;Home intravenous therapies (PICC only) 01/07/2018  5:31 PM  Exposed Catheter (cm) 0 cm 01/07/2018  5:31 PM  Site Assessment Clean;Dry;Intact 01/07/2018  5:31 PM  Line Status Flushed;Saline locked;Blood return noted 01/07/2018  5:31 PM  Dressing Type Transparent;Securing device 01/07/2018  5:31 PM  Dressing Status Clean;Dry;Intact;Antimicrobial disc in place 01/07/2018  5:31 PM  Dressing Change Due 01/14/18 01/07/2018  5:31 PM       Frances Maywood 01/07/2018, 5:34 PM

## 2018-01-07 NOTE — Plan of Care (Signed)
  Problem: Clinical Measurements: Goal: Diagnostic test results will improve Outcome: Progressing Goal: Respiratory complications will improve Outcome: Progressing   Problem: Nutrition: Goal: Adequate nutrition will be maintained Outcome: Progressing   Problem: Coping: Goal: Level of anxiety will decrease Outcome: Progressing   Problem: Elimination: Goal: Will not experience complications related to urinary retention Outcome: Progressing   Problem: Safety: Goal: Ability to remain free from injury will improve Outcome: Progressing

## 2018-01-08 LAB — BASIC METABOLIC PANEL
Anion gap: 8 (ref 5–15)
BUN: 16 mg/dL (ref 6–20)
CO2: 31 mmol/L (ref 22–32)
Calcium: 8.2 mg/dL — ABNORMAL LOW (ref 8.9–10.3)
Chloride: 100 mmol/L (ref 98–111)
Creatinine, Ser: 0.77 mg/dL (ref 0.61–1.24)
GFR calc Af Amer: >60 mL/min (ref >60)
GFR calc non Af Amer: >60 mL/min (ref >60)
Glucose, Bld: 155 mg/dL — ABNORMAL HIGH (ref 70–99)
Potassium: 3.6 mmol/L (ref 3.5–5.1)
Sodium: 139 mmol/L (ref 135–145)

## 2018-01-08 LAB — CBC
HCT: 31.5 % — ABNORMAL LOW (ref 40.0–52.0)
Hemoglobin: 10.6 g/dL — ABNORMAL LOW (ref 13.0–18.0)
MCH: 28.2 pg (ref 26.0–34.0)
MCHC: 33.7 g/dL (ref 32.0–36.0)
MCV: 83.6 fL (ref 80.0–100.0)
Platelets: 348 10*3/uL (ref 150–440)
RBC: 3.77 MIL/uL — ABNORMAL LOW (ref 4.40–5.90)
RDW: 18.7 % — ABNORMAL HIGH (ref 11.5–14.5)
WBC: 12.6 10*3/uL — ABNORMAL HIGH (ref 3.8–10.6)

## 2018-01-08 LAB — GLUCOSE, CAPILLARY
Glucose-Capillary: 84 mg/dL (ref 70–99)
Glucose-Capillary: 136 mg/dL — ABNORMAL HIGH (ref 70–99)
Glucose-Capillary: 125 mg/dL — ABNORMAL HIGH (ref 70–99)
Glucose-Capillary: 133 mg/dL — ABNORMAL HIGH (ref 70–99)
Glucose-Capillary: 159 mg/dL — ABNORMAL HIGH (ref 70–99)

## 2018-01-08 MED ORDER — OXYCODONE HCL 5 MG PO TABS
10.0000 mg | ORAL_TABLET | Freq: Four times a day (QID) | ORAL | Status: DC | PRN
  Administered 2018-01-08 – 2018-01-09 (×3): 10 mg via ORAL
  Filled 2018-01-08 (×3): qty 2

## 2018-01-08 MED ORDER — SODIUM CHLORIDE 0.9 % IV BOLUS
250.0000 mL | Freq: Once | INTRAVENOUS | Status: AC
  Administered 2018-01-08: 250 mL via INTRAVENOUS

## 2018-01-08 MED ORDER — FENTANYL CITRATE (PF) 100 MCG/2ML IJ SOLN: 25.0000 ug | INTRAMUSCULAR | Status: DC | PRN

## 2018-01-08 NOTE — Progress Notes (Signed)
Patient complaining of 9/10 pain.  BP is running soft.  Per Dr. Bridgett Larsson, do not given pain medication if SBP less than 90.  Will give patient a small bolus of fluid and reassess.

## 2018-01-08 NOTE — Progress Notes (Signed)
Daily Progress Note   Subjective  - 2 Days Post-Op  F/u right foot surgery.  Objective Vitals:   01/08/18 0535 01/08/18 0802 01/08/18 1033 01/08/18 1147  BP: 95/76 96/77 (!) 87/65 (!) 88/65  Pulse: (!) 108 (!) 106 99 100  Resp:      Temp:      TempSrc:      SpO2:  93%    Weight:      Height:        Physical Exam: Dorsal wound with much less purulence.  ERythema has subsisded.  Plantar wound is 5x4x1 cm. Granular tissue at this time.  No purulence.  Laboratory CBC    Component Value Date/Time   WBC 12.6 (H) 01/08/2018 0413   HGB 10.6 (L) 01/08/2018 0413   HGB 15.0 06/08/2014 1037   HCT 31.5 (L) 01/08/2018 0413   HCT 44.5 06/08/2014 1037   PLT 348 01/08/2018 0413   PLT 261 06/08/2014 1037    BMET    Component Value Date/Time   NA 139 01/08/2018 0413   NA 136 06/08/2014 1037   K 3.6 01/08/2018 0413   K 4.3 06/08/2014 1037   CL 100 01/08/2018 0413   CL 101 06/08/2014 1037   CO2 31 01/08/2018 0413   CO2 27 06/08/2014 1037   GLUCOSE 155 (H) 01/08/2018 0413   GLUCOSE 415 (H) 06/08/2014 1037   BUN 16 01/08/2018 0413   BUN 9 06/08/2014 1037   CREATININE 0.77 01/08/2018 0413   CREATININE 1.08 06/08/2014 1037   CALCIUM 8.2 (L) 01/08/2018 0413   CALCIUM 9.2 06/08/2014 1037   GFRNONAA >60 01/08/2018 0413   GFRNONAA >60 06/08/2014 1037   GFRNONAA >60 02/17/2014 0909   GFRAA >60 01/08/2018 0413   GFRAA >60 06/08/2014 1037   GFRAA >60 02/17/2014 0909    Assessment/Planning: Improving wound right foot   Will need wound vac long term.  163mmhg, continuous   Dorsal foot dressing should be changed every 1-2 days.  NWB to right foot.  IV abx per ID.  F/U with me in 2-3 weeks.  Will sign off for now.   Samara Deist A  01/08/2018, 12:30 PM

## 2018-01-08 NOTE — Plan of Care (Signed)
  Problem: Health Behavior/Discharge Planning: Goal: Ability to manage health-related needs will improve Outcome: Progressing   Problem: Clinical Measurements: Goal: Will remain free from infection Outcome: Progressing   Problem: Coping: Goal: Level of anxiety will decrease Outcome: Progressing   Problem: Pain Managment: Goal: General experience of comfort will improve Outcome: Progressing   Problem: Safety: Goal: Ability to remain free from injury will improve Outcome: Progressing

## 2018-01-08 NOTE — Progress Notes (Signed)
[place] [facility]                                                                                                                                                                                  Patient Demographics   [name] [name], is a 57 y.o. male, DOB - Dec 30, 1960, EVO:[id number]  Admit date - 01/02/2018   Admitting Physician Lance Coon, MD  Outpatient Primary MD for the patient is Clarisse Gouge, MD   LOS - 6  Subjective: Patient has right foot pain and want pain medication.  But blood pressure is low at 80s.  Review of Systems:   CONSTITUTIONAL: No documented fever. No fatigue, weakness. No weight gain, no weight loss.  EYES: No blurry or double vision.  ENT: No tinnitus. No postnasal drip. No redness of the oropharynx.  RESPIRATORY: No cough, no wheeze, no hemoptysis. No dyspnea.  CARDIOVASCULAR: No chest pain. No orthopnea. No palpitations. No syncope.  GASTROINTESTINAL: No nausea, no vomiting or diarrhea. No abdominal pain. No melena or hematochezia.  GENITOURINARY: No dysuria or hematuria.  ENDOCRINE: No polyuria or nocturia. No heat or cold intolerance.  HEMATOLOGY: No anemia. No bruising. No bleeding.  INTEGUMENTARY: No rashes. No lesions.  MUSCULOSKELETAL: Positive right foot pain NEUROLOGIC: No numbness, tingling, or ataxia. No seizure-type activity.  PSYCHIATRIC: No anxiety. No insomnia. No ADD.    Vitals:   Vitals:   01/08/18 0535 01/08/18 0802 01/08/18 1033 01/08/18 1147  BP: 95/76 96/77 (!) 87/65 (!) 88/65  Pulse: (!) 108 (!) 106 99 100  Resp:      Temp:      TempSrc:      SpO2:  93%    Weight:      Height:        Wt Readings from Last 3 Encounters:  01/06/18 229 lb (103.9 kg)  12/23/17 233 lb 7.5 oz (105.9 kg)  11/23/17 230 lb 4.8 oz (104.5 kg)     Intake/Output Summary (Last 24 hours) at 01/08/2018 1333 Last data filed at 01/08/2018 1020 Gross per 24 hour  Intake 1080.83 ml  Output 1650 ml  Net -569.17 ml     Physical Exam:   GENERAL: Pleasant-appearing in no apparent distress.  Obesity. HEAD, EYES, EARS, NOSE AND THROAT: Atraumatic, normocephalic. Extraocular muscles are intact. Pupils equal and reactive to light. Sclerae anicteric. No conjunctival injection. No oro-pharyngeal erythema.  NECK: Supple. There is no jugular venous distention. No bruits, no lymphadenopathy, no thyromegaly.  HEART: Regular rate and rhythm,. No murmurs, no rubs, no clicks.  LUNGS: Clear to auscultation bilaterally. No rales or rhonchi. No wheezes.  ABDOMEN: Soft, flat, nontender, nondistended. Has good bowel  sounds. No hepatosplenomegaly appreciated.  EXTREMITIES: He has dressing in place on the right foot NEUROLOGIC: The patient is alert, awake, and oriented x3 with no focal motor or sensory deficits appreciated bilaterally.  SKIN: Moist and warm with no rashes appreciated.  Psych: Not anxious, depressed LN: No inguinal LN enlargement    Antibiotics   Anti-infectives (From admission, onward)   Start     Dose/Rate Route Frequency Ordered Stop   01/06/18 1700  metroNIDAZOLE (FLAGYL) tablet 500 mg     500 mg Oral Every 8 hours 01/06/18 1604     01/06/18 1400  clindamycin (CLEOCIN) IVPB 300 mg     300 mg 100 mL/hr over 30 Minutes Intravenous  Once 01/06/18 1229 01/06/18 1340   01/06/18 1230  clindamycin (CLEOCIN) 300 MG/50ML IVPB  Status:  Discontinued    Note to Pharmacy:  Despina Arias  : cabinet override      01/06/18 1230 01/06/18 1242   01/05/18 1800  ampicillin (OMNIPEN) 1 g in sodium chloride 0.9 % 100 mL IVPB  Status:  Discontinued     1 g 300 mL/hr over 20 Minutes Intravenous Every 6 hours 01/05/18 1449 01/05/18 1614   01/05/18 1700  ampicillin (OMNIPEN) 2 g in sodium chloride 0.9 % 100 mL IVPB     2 g 300 mL/hr over 20 Minutes Intravenous Every 4 hours 01/05/18 1614     01/04/18 2200  ceFEPIme (MAXIPIME) 2 g in sodium chloride 0.9 % 100 mL IVPB  Status:  Discontinued     2 g 200 mL/hr over  30 Minutes Intravenous Every 8 hours 01/04/18 1321 01/05/18 1457   01/04/18 1400  vancomycin (VANCOCIN) IVPB 1000 mg/200 mL premix  Status:  Discontinued     1,000 mg 200 mL/hr over 60 Minutes Intravenous Every 12 hours 01/04/18 1318 01/05/18 1457   01/03/18 1000  ceFEPIme (MAXIPIME) 2 g in sodium chloride 0.9 % 100 mL IVPB  Status:  Discontinued     2 g 200 mL/hr over 30 Minutes Intravenous Every 12 hours 01/03/18 0052 01/04/18 1321   01/03/18 0600  vancomycin (VANCOCIN) IVPB 1000 mg/200 mL premix  Status:  Discontinued     1,000 mg 200 mL/hr over 60 Minutes Intravenous Every 18 hours 01/03/18 0052 01/04/18 1318   01/02/18 2230  metroNIDAZOLE (FLAGYL) IVPB 500 mg     500 mg 100 mL/hr over 60 Minutes Intravenous  Once 01/02/18 2227 01/03/18 0032   01/02/18 2145  ceFEPIme (MAXIPIME) 2 g in sodium chloride 0.9 % 100 mL IVPB     2 g 200 mL/hr over 30 Minutes Intravenous  Once 01/02/18 2133 01/02/18 2310   01/02/18 2145  vancomycin (VANCOCIN) IVPB 1000 mg/200 mL premix     1,000 mg 200 mL/hr over 60 Minutes Intravenous  Once 01/02/18 2133 01/02/18 2335      Medications   Scheduled Meds: . aspirin EC  81 mg Oral Daily  . atorvastatin  80 mg Oral Daily  . clopidogrel  75 mg Oral Daily  . enoxaparin (LOVENOX) injection  40 mg Subcutaneous Q24H  . gabapentin  400 mg Oral BID  . insulin aspart  0-20 Units Subcutaneous TID AC & HS  . insulin aspart  6 Units Subcutaneous TID WC  . insulin glargine  60 Units Subcutaneous QHS  . metroNIDAZOLE  500 mg Oral Q8H  . multivitamin-lutein  1 capsule Oral Daily  . nicotine  21 mg Transdermal Daily  . nutrition supplement (JUVEN)  1 packet Oral BID BM  .  protein supplement shake  11 oz Oral BID BM  . sodium chloride flush  10-40 mL Intracatheter Q12H  . traZODone  50 mg Oral QHS   Continuous Infusions: . ampicillin (OMNIPEN) IV Stopped (01/08/18 1226)   PRN Meds:.acetaminophen **OR** acetaminophen, ALPRAZolam, diphenhydrAMINE,  ipratropium-albuterol, morphine injection, ondansetron **OR** ondansetron (ZOFRAN) IV, oxyCODONE, sodium chloride flush   Data Review:   Micro Results Recent Results (from the past 240 hour(s))  Blood Culture (routine x 2)     Status: Abnormal   Collection Time: 01/02/18  9:53 PM  Result Value Ref Range Status   Specimen Description   Final    BLOOD LEFT WRIST Performed at [facility], [address] [place], [place] [address]    Special Requests   Final    BOTTLES DRAWN AEROBIC AND ANAEROBIC Blood Culture results may not be optimal due to an inadequate volume of blood received in culture bottles Performed at [name], [address] [place], [place] [address]    Culture  Setup Time   Final    GRAM POSITIVE COCCI ANAEROBIC BOTTLE ONLY CRITICAL VALUE NOTED.  VALUE IS CONSISTENT WITH PREVIOUSLY REPORTED AND CALLED VALUE. Performed at [facility], [address] [place], [place] [address]    Culture (A)  Final    VIRIDANS STREPTOCOCCUS THE SIGNIFICANCE OF ISOLATING THIS ORGANISM FROM A SINGLE SET OF BLOOD CULTURES WHEN MULTIPLE SETS ARE DRAWN IS UNCERTAIN. PLEASE NOTIFY THE MICROBIOLOGY DEPARTMENT WITHIN ONE WEEK IF SPECIATION AND SENSITIVITIES ARE REQUIRED. Performed at [facility], [place] [address] [place], [place] [address]    Report Status 01/06/2018 FINAL  Final  Blood Culture (routine x 2)     Status: Abnormal   Collection Time: 01/02/18  9:53 PM  Result Value Ref Range Status   Specimen Description   Final    BLOOD LEFT ANTECUBITAL Performed at [facility], [address] [place], [place] [address]    Special Requests   Final    BOTTLES DRAWN AEROBIC AND ANAEROBIC Blood Culture results may not be optimal due to an inadequate volume of blood received in culture bottles Performed at [facility], [address], [place], [place] [address]    Culture  Setup Time   Final    GRAM POSITIVE COCCI IN BOTH  AEROBIC AND ANAEROBIC BOTTLES CRITICAL RESULT CALLED TO, READ BACK BY AND VERIFIED WITH: Dicie Beam 01/03/18 @ 1055  [place] Performed at [facility], [place] [address] [place],  [address]    Culture GROUP B STREP(S.AGALACTIAE)ISOLATED (A)  Final   Report Status 01/05/2018 FINAL  Final   Organism ID, Bacteria GROUP B STREP(S.AGALACTIAE)ISOLATED  Final      Susceptibility   Group b strep(s.agalactiae)isolated - MIC*    CLINDAMYCIN >=1 RESISTANT Resistant     AMPICILLIN <=0.25 SENSITIVE Sensitive     ERYTHROMYCIN >=8 RESISTANT Resistant     VANCOMYCIN 0.5 SENSITIVE Sensitive     CEFTRIAXONE <=0.12 SENSITIVE Sensitive     LEVOFLOXACIN 1 SENSITIVE Sensitive     PENICILLIN Value in next row Sensitive      SENSITIVE0.12    * GROUP B STREP(S.AGALACTIAE)ISOLATED  Blood Culture ID Panel (Reflexed)     Status: Abnormal   Collection Time: 01/02/18  9:53 PM  Result Value Ref Range Status   Enterococcus species NOT DETECTED NOT DETECTED Final   Listeria monocytogenes NOT DETECTED NOT DETECTED Final   Staphylococcus species NOT DETECTED NOT DETECTED Final   Staphylococcus  aureus NOT DETECTED NOT DETECTED Final   Streptococcus species DETECTED (A) NOT DETECTED Final    Comment: CRITICAL RESULT CALLED TO, READ BACK BY AND VERIFIED WITH: TELDRIN JAMES 01/03/18 @ 1055  [place]    Streptococcus agalactiae DETECTED (A) NOT DETECTED Final    Comment: CRITICAL RESULT CALLED TO, READ BACK BY AND VERIFIED WITH: Dicie Beam 01/03/18 @ 1055  [place]    Streptococcus pneumoniae NOT DETECTED NOT DETECTED Final   Streptococcus pyogenes NOT DETECTED NOT DETECTED Final   Acinetobacter baumannii NOT DETECTED NOT DETECTED Final   Enterobacteriaceae species NOT DETECTED NOT DETECTED Final   Enterobacter cloacae complex NOT DETECTED NOT DETECTED Final   Escherichia coli NOT DETECTED NOT DETECTED Final   Klebsiella oxytoca NOT DETECTED NOT DETECTED Final   Klebsiella pneumoniae NOT DETECTED NOT DETECTED Final    Proteus species NOT DETECTED NOT DETECTED Final   Serratia marcescens NOT DETECTED NOT DETECTED Final   Haemophilus influenzae NOT DETECTED NOT DETECTED Final   Neisseria meningitidis NOT DETECTED NOT DETECTED Final   Pseudomonas aeruginosa NOT DETECTED NOT DETECTED Final   Candida albicans NOT DETECTED NOT DETECTED Final   Candida glabrata NOT DETECTED NOT DETECTED Final   Candida krusei NOT DETECTED NOT DETECTED Final   Candida parapsilosis NOT DETECTED NOT DETECTED Final   Candida tropicalis NOT DETECTED NOT DETECTED Final    Comment: Performed at [facility], [address] [place], [place] [address]  Aerobic/Anaerobic Culture (surgical/deep wound)     Status: None (Preliminary result)   Collection Time: 01/03/18  3:49 AM  Result Value Ref Range Status   Specimen Description   Final    WOUND RIGHT FOOT Performed at [facility], [address] [place], [place] [address]    Special Requests   Final    NONE Performed at [facility], [address] [place], [place] [address]    Gram Stain   Final    RARE WBC PRESENT, PREDOMINANTLY PMN ABUNDANT GRAM POSITIVE COCCI IN PAIRS IN CLUSTERS FEW GRAM NEGATIVE RODS    Culture   Final    ABUNDANT GROUP B STREP(S.AGALACTIAE)ISOLATED WITHIN MIXED CULTURE TESTING AGAINST S. AGALACTIAE NOT ROUTINELY PERFORMED DUE TO PREDICTABILITY OF AMP/PEN/VAN SUSCEPTIBILITY. Performed at [facility], [place] [address], [place], [place] [address]    Report Status PENDING  Incomplete  Aerobic/Anaerobic Culture (surgical/deep wound)     Status: None (Preliminary result)   Collection Time: 01/04/18  8:59 AM  Result Value Ref Range Status   Specimen Description WOUND RIGHT FOOT  Final   Special Requests PATIENT ON FOLLOWING VANC CEFEPIME FLAGYL  Final   Gram Stain   Final    FEW WBC PRESENT, PREDOMINANTLY PMN ABUNDANT GRAM POSITIVE COCCI IN PAIRS IN CLUSTERS Performed at [facility], [place] [address]  [place], [place] [address]    Culture   Final    MODERATE GROUP B STREP(S.AGALACTIAE)ISOLATED TESTING AGAINST S. AGALACTIAE NOT ROUTINELY PERFORMED DUE TO PREDICTABILITY OF AMP/PEN/VAN SUSCEPTIBILITY. MODERATE ENTEROCOCCUS FAECALIS WITHIN MIXED CULTURE NO ANAEROBES ISOLATED; CULTURE IN PROGRESS FOR 5 DAYS    Report Status PENDING  Incomplete   Organism ID, Bacteria ENTEROCOCCUS FAECALIS  Final      Susceptibility   Enterococcus faecalis - MIC*    AMPICILLIN <=2 SENSITIVE Sensitive     VANCOMYCIN 1 SENSITIVE Sensitive     GENTAMICIN SYNERGY SENSITIVE Sensitive     * MODERATE ENTEROCOCCUS FAECALIS  Urine culture     Status: None  Collection Time: 01/04/18 11:59 PM  Result Value Ref Range Status   Specimen Description   Final    URINE, RANDOM Performed at [facility], [address], [place], [place] [address]    Special Requests   Final    NONE Performed at [facility], [address], [place], [place] [address]    Culture   Final    NO GROWTH Performed at [facility], [place] [address], [place], [place] [address]    Report Status 01/06/2018 FINAL  Final  CULTURE, BLOOD (ROUTINE X 2) w Reflex to ID Panel     Status: None (Preliminary result)   Collection Time: 01/05/18  4:25 PM  Result Value Ref Range Status   Specimen Description BLOOD BLOOD RIGHT HAND  Final   Special Requests   Final    BOTTLES DRAWN AEROBIC AND ANAEROBIC Blood Culture adequate volume   Culture   Final    NO GROWTH 3 DAYS Performed at [facility], [address] [place], [place] [address]    Report Status PENDING  Incomplete  CULTURE, BLOOD (ROUTINE X 2) w Reflex to ID Panel     Status: None (Preliminary result)   Collection Time: 01/05/18  5:16 PM  Result Value Ref Range Status   Specimen Description BLOOD BLOOD RIGHT HAND  Final   Special Requests   Final    BOTTLES DRAWN AEROBIC AND ANAEROBIC Blood Culture adequate volume   Culture   Final    NO GROWTH 3  DAYS Performed at [facility], [address], [place], [place] [address]    Report Status PENDING  Incomplete    Radiology Reports Dg Chest 2 View  Result Date: 01/02/2018 CLINICAL DATA:  Weakness and tremors. Gait abnormalities and memory issues. Sepsis. EXAM: CHEST - 2 VIEW COMPARISON:  12/21/2017 FINDINGS: Mild cardiac enlargement. No vascular congestion, edema, or consolidation. No blunting of costophrenic angles. No pneumothorax. Calcification of the aorta. Mediastinal contours appear intact. Degenerative changes in the spine and shoulders. Old resection or resorption of the distal right clavicle. IMPRESSION: Mild cardiac enlargement. No evidence of active pulmonary disease. Aortic atherosclerosis. Electronically Signed   By: Lucienne Capers M.D.   On: 01/02/2018 21:51   Dg Chest 2 View  Result Date: 12/21/2017 CLINICAL DATA:  Patient reports upper chest pain (points to base of neck) all day that has gotten worse over the days. Hx/o asthma, carotid arterial disease, CHF, COPD, PAD, and diabetes Smoker EXAM: CHEST - 2 VIEW COMPARISON:  11/21/2017 FINDINGS: Mild hyperinflation. Midline trachea. Normal heart size. Atherosclerosis in the transverse aorta. No pleural effusion or pneumothorax. Diffuse peribronchial thickening. Clear lungs. IMPRESSION: No acute cardiopulmonary disease. Hyperinflation and mild interstitial thickening, most consistent with COPD/chronic bronchitis. Aortic Atherosclerosis (ICD10-I70.0). Electronically Signed   By: Abigail Miyamoto M.D.   On: 12/21/2017 23:35   Ct Chest Wo Contrast  Result Date: 12/22/2017 CLINICAL DATA:  57 year old male with chest pain and shortness of breath. EXAM: CT CHEST WITHOUT CONTRAST TECHNIQUE: Multidetector CT imaging of the chest was performed following the standard protocol without IV contrast. COMPARISON:  Chest radiograph dated 12/21/2017 and CT dated 04/22/2017 FINDINGS: Evaluation of this exam is limited in the absence of  intravenous contrast. Cardiovascular: Top-normal cardiac size. No pericardial effusion. Advanced multi vessel coronary vascular calcification. There is mild atherosclerotic calcification of the thoracic aorta. The aorta and central pulmonary arteries are otherwise grossly unremarkable on this noncontrast CT. Mediastinum/Nodes: No hilar or mediastinal adenopathy. Esophagus  and the thyroid gland are grossly unremarkable. No mediastinal fluid collection. Lungs/Pleura: Bilateral ground-glass nodular densities most consistent with pneumonia, likely atypical in etiology. Clinical correlation and follow-up to resolution recommended. No focal consolidation, pleural effusion, or pneumothorax. Upper Abdomen: No acute abnormality. Musculoskeletal: Degenerative changes of the spine. No acute osseous pathology. IMPRESSION: 1. Innumerable small ground-glass pulmonary nodules most consistent with an inflammatory/infectious etiology. Clinical correlation and follow-up to resolution recommended. 2. Advanced multi vessel coronary vascular calcification. 3.  Aortic Atherosclerosis (ICD10-I70.0). Electronically Signed   By: Anner Crete M.D.   On: 12/22/2017 05:37   Mr Foot Right Wo Contrast  Result Date: 01/03/2018 CLINICAL DATA:  Large open wound near the base of the fifth metatarsal. EXAM: MRI OF THE RIGHT FOREFOOT WITHOUT CONTRAST TECHNIQUE: Multiplanar, multisequence MR imaging of the right foot was performed. No intravenous contrast was administered. COMPARISON:  Radiograph 01/02/2018 FINDINGS: Exam is somewhat limited by patient motion and lack of IV contrast. There is a large open wound on the plantar and lateral aspect of the midfoot at the level of the base of the fifth metatarsal. Diffuse surrounding cellulitis but no discrete drainable soft tissue abscess. Diffuse abnormal T1 and T2 signal intensity in the cuboid, fifth metatarsal and base of the fourth metatarsal. There is also fluid in the cuboid articulation with  the fourth and fifth metatarsals. Findings consistent with septic arthritis and osteomyelitis. Mild edema like signal abnormality in the midfoot bony structures more medially likely related to advanced degenerative disease but osteomyelitis can't be totally excluded. The tibiotalar and subtalar joints are maintained. Small joint effusions. Diffuse myositis without definite findings for pyomyositis. IMPRESSION: 1. Large plantar and lateral open wound with associated significant cellulitis and myofasciitis. No discrete drainable soft tissue abscess or pyomyositis. 2. MR findings consistent with septic arthritis at the cuboid articulation with the fourth and fifth metatarsal bases and associated osteomyelitis. 3. Midfoot degenerative changes and edema like signal abnormality, likely degenerative but could not exclude osteomyelitis. Electronically Signed   By: Marijo Sanes M.D.   On: 01/03/2018 13:37   Mr Foot Right Wo Contrast  Result Date: 12/23/2017 CLINICAL DATA:  Diabetic patient with a chronic skin ulceration on the lateral aspect of the right foot at the base of the fifth metatarsal. EXAM: MRI OF THE RIGHT FOREFOOT WITHOUT CONTRAST TECHNIQUE: Multiplanar, multisequence MR imaging of the right forefoot was performed. No intravenous contrast was administered. COMPARISON:  Plain films right foot 12/22/2017 and 07/10/2016. MRI right foot 05/06/2016 FINDINGS: Bones/Joint/Cartilage Marrow edema in the cuboid is most intense in the lateral 2 cm. There is intense marrow edema throughout the fifth metatarsal. Marrow edema is also identified in the fourth metatarsal and most intense in the proximal 2 cm. Calcaneocuboid joint effusion is noted. Marrow edema about the remaining tarsometatarsal joints is likely related to neuropathic change. Subchondral cysts are seen at the articulation of the navicular and medial cuneiform. No fracture. Ligaments Intact. Muscles and Tendons Atrophy of intrinsic musculature the foot is  identified. No intramuscular fluid collection. Soft tissues A subcutaneous fluid collection at the base of the fifth metatarsal deep to a skin ulceration measures 0.7 cm craniocaudal by 0.4 cm transverse by 1.1 cm long. IMPRESSION: Skin ulceration at the base of the fifth metatarsal with marrow edema in the lateral 2 cm of the cuboid and throughout the fifth metatarsal consistent with osteomyelitis. Marrow edema is also seen throughout almost the entire fourth metatarsal but most intense in the proximal 2 cm where it is worrisome  for osteomyelitis. Edema in the remainder of the fourth metatarsal may be reactive. Calcaneocuboid joint effusion worrisome for septic joint. Small fluid collection lateral to the base of the fifth metatarsal is likely an abscess. Charcot change of the midfoot. Electronically Signed   By: Inge Rise M.D.   On: 12/23/2017 12:23   Dg Chest Port 1 View  Result Date: 01/03/2018 CLINICAL DATA:  Acute shortness of breath for 2 days. EXAM: PORTABLE CHEST 1 VIEW COMPARISON:  01/02/2018 FINDINGS: Cardiomegaly and pulmonary vascular congestion again noted. Interstitial prominence is unchanged. There is no evidence of focal airspace disease, pulmonary edema, suspicious pulmonary nodule/mass, pleural effusion, or pneumothorax. No acute bony abnormalities are identified. IMPRESSION: Cardiomegaly with pulmonary vascular congestion. Interstitial prominence may represent mild interstitial edema. Electronically Signed   By: Margarette Canada M.D.   On: 01/03/2018 15:46   Dg Foot Complete Right  Result Date: 01/02/2018 CLINICAL DATA:  Diabetic ulcer at the base of the fifth metatarsal bone. EXAM: RIGHT FOOT COMPLETE - 3+ VIEW COMPARISON:  Right foot radiograph 12/22/2017. MRI right foot 12/23/2017 FINDINGS: Large soft tissue ulceration over the plantar surface of the lateral right foot at the level of the fifth metatarsal base. This was present on the previous study. In the interval, there is  development of soft tissue gas along the dorsum of the right foot. This likely reflects progression of infection with gas-forming organism. No definite radiographic changes to suggest osteomyelitis, but changes of osteomyelitis were demonstrated in the fourth and fifth metatarsal bones on the previous MRI. Degenerative changes in the interphalangeal and intertarsal joints. Plantar calcaneal spur. No radiopaque soft tissue foreign bodies. IMPRESSION: Large soft tissue ulceration over the plantar surface of the right foot at the level of the fifth metatarsal base as seen previously. New soft tissue gas along the dorsum of the right foot likely representing progression of infection with gas-forming organism. No radiographic evidence of osteomyelitis, but previous MRI showed evidence of osteomyelitis in the fourth and fifth metatarsal bones. Electronically Signed   By: Lucienne Capers M.D.   On: 01/02/2018 22:46   Dg Foot Complete Right  Result Date: 12/22/2017 CLINICAL DATA:  57 year old male with diabetic ulcer on the plantar right foot. EXAM: RIGHT FOOT COMPLETE - 3+ VIEW COMPARISON:  Right foot radiograph dated 07/10/2016 FINDINGS: There is no acute fracture or dislocation. Degenerative changes of the midfoot primarily involving the third tarsometatarsal joint. No periosteal reaction or erosive changes to suggest acute osteomyelitis. There is a large ulcer of the lateral aspect of the plantar soft tissues of the midfoot over the base of the fifth metatarsal which appears larger compared to the prior radiograph. There is diffuse soft tissue edema. No radiopaque foreign object. IMPRESSION: 1. Large soft tissue ulcer at the lateral plantar midfoot. 2. No acute fracture or dislocation. No radiographic evidence of acute osteomyelitis. MRI or a WBC nuclear scan may provide better evaluation if there is high clinical concern for acute osteomyelitis. Electronically Signed   By: Anner Crete M.D.   On: 12/22/2017  05:42   [place] Ekg Site Rite  Result Date: 01/07/2018 If Site Rite image not attached, placement could not be confirmed due to current cardiac rhythm.    CBC Recent Labs  Lab 01/02/18 2153  01/04/18 0031 01/05/18 0413 01/06/18 0428 01/07/18 0501 01/08/18 0413  WBC 20.6*   < > 17.6* 11.6* 12.9* 12.7* 12.6*  HGB 9.0*   < > 8.8* 10.0* 10.2* 10.6* 10.6*  HCT 26.7*   < >  26.0* 29.4* 29.7* 30.9* 31.5*  PLT 290   < > 271 316 337 338 348  MCV 83.9   < > 83.3 83.1 84.2 83.5 83.6  MCH 28.1   < > 28.0 28.4 29.1 28.7 28.2  MCHC 33.5   < > 33.6 34.2 34.5 34.3 33.7  RDW 18.4*   < > 18.0* 18.4* 18.4* 18.8* 18.7*  LYMPHSABS 0.8*  --   --   --   --   --   --   MONOABS 1.5*  --   --   --   --   --   --   EOSABS 0.0  --   --   --   --   --   --   BASOSABS 0.0  --   --   --   --   --   --    < > = values in this interval not displayed.    Chemistries  Recent Labs  Lab 01/02/18 2153  01/04/18 0031 01/05/18 0413 01/06/18 0428 01/07/18 0501 01/08/18 0413  NA 125*   < > 127* 133* 139 142 139  K 5.4*   < > 5.2* 4.7 4.6 3.9 3.6  CL 86*   < > 93* 98 102 107 100  CO2 30   < > 27 29 31 29 31   GLUCOSE 472*   < > 364* 363* 341* 180* 155*  BUN 33*   < > 39* 54* 46* 29* 16  CREATININE 2.02*   < > 1.48* 1.46* 1.09 0.89 0.77  CALCIUM 8.5*   < > 8.5* 8.5* 8.7* 8.4* 8.2*  MG  --   --   --   --  2.3  --   --   AST 62*  --   --   --   --   --   --   ALT 26  --   --   --   --   --   --   ALKPHOS 60  --   --   --   --   --   --   BILITOT 1.1  --   --   --   --   --   --    < > = values in this interval not displayed.   ------------------------------------------------------------------------------------------------------------------ estimated creatinine clearance is 119.8 mL/min (by C-G formula based on SCr of 0.77 mg/dL). ------------------------------------------------------------------------------------------------------------------ No results for input(s): HGBA1C in the last 72  hours. ------------------------------------------------------------------------------------------------------------------ No results for input(s): CHOL, HDL, LDLCALC, TRIG, CHOLHDL, LDLDIRECT in the last 72 hours. ------------------------------------------------------------------------------------------------------------------ No results for input(s): TSH, T4TOTAL, T3FREE, THYROIDAB in the last 72 hours.  Invalid input(s): FREET3 ------------------------------------------------------------------------------------------------------------------ No results for input(s): VITAMINB12, FOLATE, FERRITIN, TIBC, IRON, RETICCTPCT in the last 72 hours.  Coagulation profile Recent Labs  Lab 01/02/18 2153  INR 1.21    No results for input(s): DDIMER in the last 72 hours.  Cardiac Enzymes Recent Labs  Lab 01/03/18 1026 01/03/18 1654 01/04/18 0031  TROPONINI 1.95* 2.49* 2.17*   ------------------------------------------------------------------------------------------------------------------ Invalid input(s): POCBNP    Assessment & Plan  Patient's 57 year old with left foot wound recent admission for sepsis related to pneumonia and foot infection   #1  Sepsis ([place]) due to foot infection and bacteremia.   Blood cultures positive for streptococcus agalactia in 1 blood culture positive for strep viridans Continue ampicillin IV every 4 hours. appreciate podiatry input status post excision of the fifth metatarsal and fourth metatarsal base with incision and drainage of the  dorsal right midfoot which surgical cultures.  Status post angiogram and angioplasty, stent placement right superficial femoral artery and above-knee popliteal.  Per ID physician Dr. Flossie Dibble, the patient need IV antibiotics and penicillin or penicillin for 4 weeks.  PICC line placement. Per Dr. Vickki Muff, will need wound vac long term.  145mmhg, continuous    #2 Coronary artery disease -with possible non-ST MI felt to be due  to stress related to his sepsis patient has three-vessel disease and awaiting improvement from his medical conditions prior to CABG. continue aspirin, Plavix and Lipitor.  Hold Coreg due to hypotension.  #3  Diabetic foot infection ([place]) status post debridement as above, Treatment as above.  #4  AKI (acute kidney injury) ([place]) -improved.  #5  Hypertension -hold antihypertensives for now as the patient's blood pressure was borderline low.  Blood pressure is normal now.   #6 chronic systolic CHF LV EF: 10% -   35% -discontinue IV fluid.  Stable.  Hold Lasix, Coreg and lisinopril due to hypotension.  #7  Diabetes, Continue Lantus and sliding scale.  Hold metformin.    #8 hyperlipidemia -Home dose antilipid Tobacco abuse.  Smoking cessation was counseled for 3 to 4 minutes.  Nicotine patch.  Hypotension.  Hold Lasix, Coreg and lisinopril, given normal saline bolus 250 mL.  Blood pressure is still low side.    Code Status Orders  (From admission, onward)        Start     Ordered   01/03/18 0409  Full code  Continuous     01/03/18 0408    Code Status History    Date Active Date Inactive Code Status Order ID Comments User Context   12/22/2017 0607 12/23/2017 1916 Full Code [id number]  Harrie Foreman, MD Inpatient   11/21/2017 0415 11/23/2017 1444 Full Code [id number]  Lance Coon, MD Inpatient   04/23/2017 0355 04/29/2017 1353 Full Code [id number]  Saundra Shelling, MD Inpatient   07/11/2016 0324 07/11/2016 1851 Full Code [id number]  Saundra Shelling, MD Inpatient   05/06/2016 0005 05/07/2016 1417 Full Code [id number]  Harvie Bridge, DO Inpatient   11/21/2015 1716 11/22/2015 1950 Full Code [id number]  Gladstone Lighter, MD Inpatient   07/06/2015 0948 07/06/2015 1437 Full Code [id number]  Algernon Huxley, MD Inpatient           Consults podiatry, cardiology   DVT Prophylaxis  Lovenox   Lab Results  Component Value Date   PLT 348 01/08/2018     Time Spent in minutes 38 min Greater  than 50% of time spent in care coordination and counseling patient regarding the condition and plan of care. Discussed with the patient and his wife.  Demetrios Loll M.D on 01/08/2018 at 1:33 PM  Between 7am to 6pm - Pager - [phone]  After 6pm go to [email or web address] - Proofreader  [employer]   Office  [phone]

## 2018-01-08 NOTE — Progress Notes (Signed)
PHARMACY CONSULT NOTE FOR:  OUTPATIENT  PARENTERAL ANTIBIOTIC THERAPY (OPAT)  Indication: bacteremia/OM/cellulitis Regimen:// ampicillin 2 g iv q 4 hours End date: 02/04/18  IV antibiotic discharge orders are pended. To discharging provider:  please sign these orders via discharge navigator,  Select New Orders & click on the button choice - Manage This Unsigned Work.     Thank you for allowing pharmacy to be a part of this patient's care.  Ulice Dash D 01/08/2018, 11:02 AM

## 2018-01-08 NOTE — Progress Notes (Addendum)
Pt is complaining of 10 out 10 pain. Percocet was given at 2227. Pt has morphine but BP at 107/77. Notify prime. Will continue to monitor.  Update 0106: Morphine PRN was administered to pt. Will continue to monitor.

## 2018-01-08 NOTE — Progress Notes (Signed)
Physical Therapy Evaluation Patient Details Name: [name] [name] MRN: [id number] DOB: [birth year] Today's Date: 01/08/2018   History of Present Illness  [name] [name]  is a 57 y.o. male who presents with confusion, chills, fever, right foot wound and erythema.  Patient meets sepsis criteria here in the ED.  He is unable to contribute reliable information to his HPI given his confusion but at time of PT evaluation his cognition has improved.  Family states that he has been getting progressively worse for the past couple of days.  He has a right diabetic foot wound with surrounding erythema.  He was also found to have a significantly elevated troponin.  Hospitalist were called for admission. Pt is now admitted for sepsis secondary to foot infection/bacteremia, CAD with possible NSTEMI, and AKI.   Clinical Impression  Pt admitted with above diagnosis. Pt currently with functional limitations due to the deficits listed below (see PT Problem List).  Pt is modified independent with bed mobility however increase in time required and use of bed rail. However pt performs without external assist by therapist. Pt provided cues for safe hand placement during transfers. Cues to maintain NWB on RLE. Once standing pt is steady with UE support on rolling walker. Pt is able to ambulate around end of bed to recliner with use of rolling walker and hop-to pattern. He fatigues quickly but is able to maintain NWB on RLE. He is clearly unable to ambulate farther at this time. Pt provided education about proper sequencing with walker. Pt instructed how to perform the one step he has at home with his walker. Discussed performing mostly transfers with use of either manual or power wheelchair that he has at home. Pt will benefit from PT services to address deficits in strength, balance, and mobility in order to return to full function at home.        Follow Up Recommendations Home health PT;Other (comment)(Pt states he may prefer  outpatient PT)    Equipment Recommendations  None recommended by PT;Other (comment)(Use walker and wheelchair at home)    Recommendations for Other Services       Precautions / Restrictions Precautions Precautions: None Precaution Comments: Wound vac on ventral aspect of R foot Restrictions Weight Bearing Restrictions: Yes RLE Weight Bearing: Non weight bearing      Mobility  Bed Mobility Overal bed mobility: Independent             General bed mobility comments: Increase in time required and use of bed rail but pt performs without external assist by therapist  Transfers Overall transfer level: Needs assistance Equipment used: Rolling walker (2 wheeled) Transfers: Sit to/from Stand Sit to Stand: Min guard         General transfer comment: Pt provided cues for safe hand placement during transfers. Cues to maintain NWB on RLE. Once standing pt is steady with UE support on rollin gwalker  Ambulation/Gait Ambulation/Gait assistance: Independent Gait Distance (Feet): 15 Feet Assistive device: Rolling walker (2 wheeled)       General Gait Details: Pt is able to ambulate around end of bed to recliner with use of rolling walker and hop-to pattern. He fatigues quickly but is able to maintain NWB on RLE. Pt provided education about proper sequencing with walker.   Stairs            Wheelchair Mobility    Modified Rankin (Stroke Patients Only)       Balance Overall balance assessment: Independent  Pertinent Vitals/Pain Pain Assessment: 0-10 Pain Score: 10-Worst pain ever Pain Location: R foot Pain Intervention(s): Monitored during session(Per pt RN coming to administer pain meds)    Home Living Family/patient expects to be discharged to:: Private residence Living Arrangements: Spouse/significant other Available Help at Discharge: Family Type of Home: House Home Access: Stairs to  enter Entrance Stairs-Rails: None Entrance Stairs-Number of Steps: 1 Home Layout: One level Home Equipment: Walker - standard;Cane - single point;Wheelchair - [employer];Wheelchair - power;Shower seat - built in      Prior Function Level of Independence: Independent         Comments: Pt reports full community ambulation with single point cane. Endorses 2-3 falls in the last 12 months     Hand Dominance   Dominant Hand: Right    Extremity/Trunk Assessment   Upper Extremity Assessment Upper Extremity Assessment: Overall WFL for tasks assessed    Lower Extremity Assessment Lower Extremity Assessment: RLE deficits/detail RLE Deficits / Details: Pt reports pain with all attempts to move RLE so strength testing limited. LLE strength grossly WFL. Pt denies numbness/tingling in bilateral UE/LE       Communication   Communication: No difficulties  Cognition Arousal/Alertness: Awake/alert Behavior During Therapy: Restless Overall Cognitive Status: Within Functional Limits for tasks assessed                                        General Comments      Exercises     Assessment/Plan    PT Assessment Patient needs continued PT services  PT Problem List Decreased strength;Decreased activity tolerance;Decreased balance;Decreased mobility       PT Treatment Interventions DME instruction;Gait training;Stair training;Functional mobility training;Therapeutic activities;Therapeutic exercise;Neuromuscular re-education;Balance training    PT Goals (Current goals can be found in the Care Plan section)  Acute Rehab PT Goals Patient Stated Goal: return to prior level of function at home PT Goal Formulation: With patient Time For Goal Achievement: 01/22/18 Potential to Achieve Goals: Good    Frequency Min 2X/week   Barriers to discharge        Co-evaluation               AM-PAC PT "6 Clicks" Daily Activity  Outcome Measure Difficulty  turning over in bed (including adjusting bedclothes, sheets and blankets)?: A Little   Difficulty sitting down on and standing up from a chair with arms (e.g., wheelchair, bedside commode, etc,.)?: A Little Help needed moving to and from a bed to chair (including a wheelchair)?: A Little Help needed walking in hospital room?: A Lot Help needed climbing 3-5 steps with a railing? : A Lot 6 Click Score: 13    End of Session Equipment Utilized During Treatment: Gait belt Activity Tolerance: Patient tolerated treatment well Patient left: in chair;with call bell/phone within reach Nurse Communication: Mobility status PT Visit Diagnosis: Muscle weakness (generalized) (M62.81);Difficulty in walking, not elsewhere classified (R26.2);Unsteadiness on feet (R26.81);History of falling (Z91.81)    Time: 2952-8413 PT Time Calculation (min) (ACUTE ONLY): 23 min   Charges:   PT Evaluation $PT Eval Low Complexity: 1 Low PT Treatments $Gait Training: 8-22 mins        Jason D Huprich PT, DPT, GCS  Huprich,Jason 01/08/2018, 10:58 AM

## 2018-01-09 LAB — CREATININE, SERUM
Creatinine, Ser: 0.88 mg/dL (ref 0.61–1.24)
GFR calc Af Amer: >60 mL/min (ref >60)
GFR calc non Af Amer: >60 mL/min (ref >60)

## 2018-01-09 LAB — AEROBIC/ANAEROBIC CULTURE W GRAM STAIN (SURGICAL/DEEP WOUND)

## 2018-01-09 LAB — AEROBIC/ANAEROBIC CULTURE (SURGICAL/DEEP WOUND)

## 2018-01-09 LAB — GLUCOSE, CAPILLARY
Glucose-Capillary: 160 mg/dL — ABNORMAL HIGH (ref 70–99)
Glucose-Capillary: 116 mg/dL — ABNORMAL HIGH (ref 70–99)
Glucose-Capillary: 109 mg/dL — ABNORMAL HIGH (ref 70–99)

## 2018-01-09 MED ORDER — OCUVITE-LUTEIN PO CAPS: 1.0000 | ORAL_CAPSULE | Freq: Every day | ORAL | 0 refills | Status: DC

## 2018-01-09 MED ORDER — OXYCODONE HCL 5 MG PO TABS: 5.0000 mg | ORAL_TABLET | Freq: Four times a day (QID) | ORAL | 0 refills | Status: AC | PRN

## 2018-01-09 MED ORDER — AMPICILLIN IV (FOR PTA / DISCHARGE USE ONLY): 2.0000 g | INTRAVENOUS | 0 refills | Status: AC

## 2018-01-09 MED ORDER — JUVEN PO PACK: 1.0000 | PACK | Freq: Two times a day (BID) | ORAL | 0 refills | Status: DC

## 2018-01-09 MED ORDER — OXYCODONE HCL 5 MG PO TABS: 5.0000 mg | ORAL_TABLET | Freq: Four times a day (QID) | ORAL | Status: DC | PRN

## 2018-01-09 NOTE — Progress Notes (Signed)
Pt refusing bed alarm but was educated about safety. Will continue to monitor.

## 2018-01-09 NOTE — Discharge Instructions (Signed)
HHPT Wound care, wound vac long term IV abx 4 weeks PICC line care.

## 2018-01-09 NOTE — Plan of Care (Signed)
  Problem: Health Behavior/Discharge Planning: Goal: Ability to manage health-related needs will improve Outcome: Adequate for Discharge   Problem: Clinical Measurements: Goal: Ability to maintain clinical measurements within normal limits will improve Outcome: Adequate for Discharge Goal: Will remain free from infection Outcome: Adequate for Discharge Goal: Diagnostic test results will improve Outcome: Adequate for Discharge Goal: Respiratory complications will improve Outcome: Adequate for Discharge Goal: Cardiovascular complication will be avoided Outcome: Adequate for Discharge   Problem: Activity: Goal: Risk for activity intolerance will decrease Outcome: Adequate for Discharge   Problem: Nutrition: Goal: Adequate nutrition will be maintained Outcome: Adequate for Discharge   Problem: Coping: Goal: Level of anxiety will decrease Outcome: Adequate for Discharge   Problem: Elimination: Goal: Will not experience complications related to bowel motility Outcome: Adequate for Discharge Goal: Will not experience complications related to urinary retention Outcome: Adequate for Discharge   Problem: Pain Managment: Goal: General experience of comfort will improve Outcome: Adequate for Discharge   Problem: Safety: Goal: Ability to remain free from injury will improve Outcome: Adequate for Discharge   Problem: Skin Integrity: Goal: Risk for impaired skin integrity will decrease Outcome: Adequate for Discharge   Problem: Fluid Volume: Goal: Hemodynamic stability will improve Outcome: Adequate for Discharge   Problem: Clinical Measurements: Goal: Diagnostic test results will improve Outcome: Adequate for Discharge Goal: Signs and symptoms of infection will decrease Outcome: Adequate for Discharge

## 2018-01-09 NOTE — Consult Note (Signed)
      INFECTIOUS DISEASE ATTENDING ADDENDUM:   Date: 01/09/2018  Patient name: ANJELO PULLMAN  Medical record number: [id number]  Date of birth: 07-29-1960   Patient with DFU with seeding of blood with GBS. Patient has undergone I and D and Excision of 4th and 5th MT  Operative cultures yielded GBS and Enterococcus Faecalis  I agree with 4 weeks of IV Ampicillin  PATIENT NEEDS TO BE EVALUATED BY [place] FORMALLY IN OUR ID CLINIC IN THE NEXT 4 WEEKS PRIOR TO STOPPING IV ABX  Diagnosis: DFU, osteomyelitis, bacteremia  Culture Result: GBS in blood and foot, E faecalis in foot  Allergies  Allergen Reactions  . Dulaglutide Anaphylaxis, Diarrhea and Hives  . Prednisone Rash    OPAT Orders Discharge antibiotics:AMPICILLIN Per pharmacy protoco AMP  Duration: 4 weeks End Date:  8/28/209  [facility] Care Per Protocol:  Labs weekly while on IV antibiotics: _x_ CBC with differential _x_ BMP  _x_ CRP _x_ ESR   __ Please pull PIC at completion of IV antibiotics _x_ Please leave PIC in place until doctor has seen patient or been notified  Fax weekly labs to [phone]  Clinic Follow Up Appt:  Next 3 weeks    Alcide Evener 01/09/2018, 12:19 PM

## 2018-01-09 NOTE — Progress Notes (Signed)
Wound vac delivered to the patient's room. SWOT nurse changed patient's wound vac. Patient waiting on significant other to come pick him up. Home health set up already.

## 2018-01-09 NOTE — Discharge Summary (Signed)
[facility] NAME: [name] [name]    MR#:  [id number]  DATE OF BIRTH:  [birth year]  DATE OF ADMISSION:  01/02/2018   ADMITTING PHYSICIAN: Lance Coon, MD  DATE OF DISCHARGE: 01/09/2018 PRIMARY CARE PHYSICIAN: Clarisse Gouge, MD   ADMISSION DIAGNOSIS:  Elevated troponin [R74.8] Acute kidney injury ([place]) [N17.9] Sepsis, due to unspecified organism ([place]) [A41.9] Sepsis ([place]) [A41.9] DISCHARGE DIAGNOSIS:  Principal Problem:   Sepsis ([place]) Active Problems:   PAD (peripheral artery disease) ([place])   Coronary artery disease   Hyperlipidemia   Hypertension   Tobacco use   Diabetic foot infection ([place])   Ulcer of right foot ([place])   Chronic systolic CHF (congestive heart failure) (HCC)   Diabetes ([place])   NSTEMI (non-ST elevated myocardial infarction) ([place])   AKI (acute kidney injury) ([place])  SECONDARY DIAGNOSIS:   Past Medical History:  Diagnosis Date  . Arthritis   . Asthma   . Atherosclerosis   . BPH (benign prostatic hyperplasia)   . Carotid arterial disease ([place])   . Charcot's joint of foot, right   . Chronic combined systolic (congestive) and diastolic (congestive) heart failure ([place])    a. 02/2013 EF 50% by LV gram; b. 04/2017 Echo: EF 25-30%. diff HK. Gr2 DD. Mod dil LA/RV. PASP 13mHg.  .Marland KitchenCOPD (chronic obstructive pulmonary disease) (HTryon   . Coronary artery disease    a. 2013 S/P PCI of LAD ([facility];  b. 03/2013 PCI: RCA 90p ( 2.5 x 23 mm DES); c. 02/2014 Cath: patent RCA stent->Med Rx; d. 04/2017 Cath: LM nl, LAd 30ost/p, 627m, D1 80ost, LCX 90p/m, OM1 85, RCA 70/20p, 8068m0d->Referred for CT Surg-felt to be poor candidate.  . Diabetic neuropathy (HCCForty Fort . Diabetic ulcer of right foot (HCCMobridge . Hernia   . Hyperlipidemia   . Hypertension   . Ischemic cardiomyopathy    a. 04/2017 Echo: EF 25-30%; b. 08/2017 Cardiac MRI ([facility]): EF 19%, sev glob HK. RVEF 20%, mod BAE, triv MR, mild-mod TR. Basal lateral subendocardial  infarct (viable), inf/infsept, dist septal ischemia, basal to mid lat peri-infarct ischemia.  . Morbid obesity (HCCJamestown . Neuropathy   . PAD (peripheral artery disease) (HCCWynantskill  a. Followed by Dr. DewLucky Cowboy. 08/2010 Periph Angio: RSFA 70-80p (6X60 self-expanding stent); c. 09/2010 Periph Angio: L SFA 100p (7x unknown length self-expanding stent); d. 06/2015 Periph Angio: R SFA short segment occlusion (6x12 self-expanding stent).  . Restless leg syndrome   . Sleep apnea   . Subclavian artery stenosis, right (HCCDay . Syncope and collapse   . Tobacco use    a. 75+ yr hx - still smoking 1ppd, down from 2 ppd.  . Type II diabetes mellitus (HCCDouglas . Varicose vein    HOSPITAL COURSE:  Patient's 57 64ar old with left foot wound recent admission for sepsis related to pneumonia and foot infection  #1Sepsis (HCCPorterue to foot infection and bacteremia.   Blood cultures positive for streptococcus agalactia in 1 blood culture positive for strep viridans Continue ampicillin IV every 4 hours. appreciate podiatry input status post excision of the fifth metatarsal and fourth metatarsal base with incision and drainage of the dorsal right midfoot which surgical cultures.  Status post angiogram and angioplasty, stent placementrightsuperficial femoral artery andabove-kneepopliteal.  Per ID physician Dr. KeeFlossie Dibblehe patient need IV antibiotics and penicillin or penicillin for 4 weeks.  PICC line placed. Per Dr.  Vickki Muff, will need wound vac long term. 162mhg, continuous   #2Coronary artery disease -with possible non-ST MI felt to be due to stress related to his sepsis patient has three-vessel disease and awaiting improvement from his medical conditions prior to CABG. continue aspirin, Plavix and Lipitor.  Hold Coreg due to hypotension.  Follow-up cardiologist as outpatient.  #3Diabetic foot infection (HRocky Mound status post debridement as above, Treatment as above.  #4AKI (acute kidney injury) (HKendall Park  -improved.  #5Hypertension -hold antihypertensives for now as the patient's blood pressure was borderline low.  Blood pressure is normal now.  #6 chronic systolic CHF LV EF: 356%- 35% -discontinue IV fluid.  Stable.  Hold Lasix, Coreg and lisinopril due to hypotension.  #7Diabetes, Continue Lantus and sliding scale.  Hold metformin.  #8 hyperlipidemia -Home dose antilipid Tobacco abuse.  Smoking cessation was counseled for 3 to 4 minutes.  Nicotine patch.  Hypotension.  Hold Lasix, Coreg and lisinopril, given normal saline bolus 250 mL.  Blood pressure is better but still at low side. DISCHARGE CONDITIONS:  Stable, discharge to home today with home health and PT. CONSULTS OBTAINED:  Treatment Team:  FSamara Deist DPM CDemetrios Loll MD VTommy Medal CLavell Islam MD DRUG ALLERGIES:   Allergies  Allergen Reactions  . Dulaglutide Anaphylaxis, Diarrhea and Hives  . Prednisone Rash   DISCHARGE MEDICATIONS:   Allergies as of 01/09/2018      Reactions   Dulaglutide Anaphylaxis, Diarrhea, Hives   Prednisone Rash      Medication List    STOP taking these medications   carvedilol 6.25 MG tablet Commonly known as:  COREG   doxycycline 100 MG capsule Commonly known as:  VIBRAMYCIN   furosemide 40 MG tablet Commonly known as:  LASIX   lisinopril 5 MG tablet Commonly known as:  PRINIVIL,ZESTRIL   predniSONE 10 MG (21) Tbpk tablet Commonly known as:  STERAPRED UNI-PAK 21 TAB     TAKE these medications   albuterol 108 (90 Base) MCG/ACT inhaler Commonly known as:  PROVENTIL HFA;VENTOLIN HFA Inhale 2 puffs into the lungs every 6 (six) hours as needed for wheezing or shortness of breath.   ampicillin IVPB Inject 2 g into the vein every 4 (four) hours for 24 days. Indication:  Bacteremia, osteomyelitis, cellulitis Last Day of Therapy:  02/05/18 Labs - Once weekly:  CBC/D and BMP, Labs - Every other week:  ESR and CRP   aspirin EC 81 MG tablet Take 1 tablet (81 mg total)  by mouth daily.   atorvastatin 80 MG tablet Commonly known as:  LIPITOR Take 80 mg by mouth daily.   clopidogrel 75 MG tablet Commonly known as:  PLAVIX Take 1 tablet by mouth daily.   gabapentin 800 MG tablet Commonly known as:  NEURONTIN Take 1 tablet by mouth 4 (four) times daily.   Insulin Glargine 100 UNIT/ML Solostar Pen Commonly known as:  LANTUS SOLOSTAR Inject 70 Units into the skin at bedtime. What changed:  how much to take   insulin lispro 100 UNIT/ML injection Commonly known as:  HUMALOG Inject 0-10 Units into the skin 3 (three) times daily with meals. If BS is 0 to 70 no insulin and eat/glucose tablets/glucose get If BS 71 to 150 great! If BS 151 to 200, 2 Units Humaog If BS 201 to 250, 4 Units Humalog If BS 251 to 300, 6 Units Humalog If BS 301 to 350, 8 Units Humalog If BS 351 to 400, 10 Units Humalog If BS greater than 400, take  10 Units and Contact our office during office hour   ipratropium-albuterol 0.5-2.5 (3) MG/3ML Soln Commonly known as:  DUONEB Inhale 3 mLs into the lungs every 6 (six) hours as needed (wheezing/sob).   metFORMIN 500 MG 24 hr tablet Commonly known as:  GLUCOPHAGE-XR Take 500 mg by mouth daily with breakfast.   multivitamin-lutein Caps capsule Take 1 capsule by mouth daily.   nicotine 14 mg/24hr patch Commonly known as:  NICODERM CQ - dosed in mg/24 hours Place 1 patch (14 mg total) onto the skin daily.   nitroGLYCERIN 0.4 MG SL tablet Commonly known as:  NITROSTAT Place 0.4 mg under the tongue every 5 (five) minutes as needed for chest pain. What changed:  Another medication with the same name was removed. Continue taking this medication, and follow the directions you see here.   nutrition supplement (JUVEN) Pack Take 1 packet by mouth 2 (two) times daily between meals.   oxyCODONE 5 MG immediate release tablet Commonly known as:  Oxy IR/ROXICODONE Take 1 tablet (5 mg total) by mouth every 6 (six) hours as needed for up  to 5 days for moderate pain, severe pain or breakthrough pain. What changed:    how much to take  reasons to take this   traZODone 50 MG tablet Commonly known as:  DESYREL Take 1 tablet by mouth at bedtime.            Home Infusion Instuctions  (From admission, onward)        Start     Ordered   01/09/18 0000  Home infusion instructions [facility] May follow [place] Dosing Protocol; May administer Cathflo as needed to maintain patency of vascular access device.; Flushing of vascular access device: per [facility] Protocol: 0.9% NaCl pre/post medica...    Question Answer Comment  Instructions May follow [place] Dosing Protocol   Instructions May administer Cathflo as needed to maintain patency of vascular access device.   Instructions Flushing of vascular access device: per [facility] Protocol: 0.9% NaCl pre/post medication administration and prn patency; Heparin 100 u/ml, 36m for implanted ports and Heparin 10u/ml, 559mfor all other central venous catheters.   Instructions May follow [facility] Anaphylaxis Protocol for First Dose Administration in the home: 0.9% NaCl at 25-50 ml/hr to maintain IV access for protocol meds. Epinephrine 0.3 ml IV/IM PRN and Benadryl 25-50 IV/IM PRN s/s of anaphylaxis.   Instructions [facility] Infusion Coordinator (RN) to assist per patient IV care needs in the home PRN.      01/09/18 0827       DISCHARGE INSTRUCTIONS:  See AVS. If you experience worsening of your admission symptoms, develop shortness of breath, life threatening emergency, suicidal or homicidal thoughts you must seek medical attention immediately by calling 911 or calling your MD immediately  if symptoms less severe.  You Must read complete instructions/literature along with all the possible adverse reactions/side effects for all the Medicines you take and that have been prescribed to you. Take any new Medicines after you have completely understood and accpet all the possible  adverse reactions/side effects.   Please note  You were cared for by a hospitalist during your hospital stay. If you have any questions about your discharge medications or the care you received while you were in the hospital after you are discharged, you can call the unit and asked to speak with the hospitalist on call if the hospitalist that took care of you is not available. Once you are discharged,  your primary care physician will handle any further medical issues. Please note that NO REFILLS for any discharge medications will be authorized once you are discharged, as it is imperative that you return to your primary care physician (or establish a relationship with a primary care physician if you do not have one) for your aftercare needs so that they can reassess your need for medications and monitor your lab values.    On the day of Discharge:  VITAL SIGNS:  Blood pressure 96/72, pulse (!) 110, temperature 98.4 F (36.9 C), temperature source Oral, resp. rate 20, height _0  (1.702 m), weight 229 lb (103.9 kg), SpO2 95 %. PHYSICAL EXAMINATION:  GENERAL:  57 y.o.-year-old patient lying in the bed with no acute distress.  Obesity. EYES: Pupils equal, round, reactive to light and accommodation. No scleral icterus. Extraocular muscles intact.  HEENT: Head atraumatic, normocephalic. Oropharynx and nasopharynx clear.  NECK:  Supple, no jugular venous distention. No thyroid enlargement, no tenderness.  LUNGS: Normal breath sounds bilaterally, no wheezing, rales,rhonchi or crepitation. No use of accessory muscles of respiration.  CARDIOVASCULAR: S1, S2 normal. No murmurs, rubs, or gallops.  ABDOMEN: Soft, non-tender, non-distended. Bowel sounds present. No organomegaly or mass.  EXTREMITIES: No pedal edema, cyanosis, or clubbing. He has dressing in place on the right foot. NEUROLOGIC: Cranial nerves II through XII are intact. Muscle strength 4/5 in all extremities. Sensation intact. Gait not  checked.  PSYCHIATRIC: The patient is alert and oriented x 3.  SKIN: No obvious rash, lesion, or ulcer.  DATA REVIEW:   CBC Recent Labs  Lab 01/08/18 0413  WBC 12.6*  HGB 10.6*  HCT 31.5*  PLT 348    Chemistries  Recent Labs  Lab 01/02/18 2153  01/06/18 0428  01/08/18 0413 01/09/18 0506  NA 125*   < > 139   < > 139  --   K 5.4*   < > 4.6   < > 3.6  --   CL 86*   < > 102   < > 100  --   CO2 30   < > 31   < > 31  --   GLUCOSE 472*   < > 341*   < > 155*  --   BUN 33*   < > 46*   < > 16  --   CREATININE 2.02*   < > 1.09   < > 0.77 0.88  CALCIUM 8.5*   < > 8.7*   < > 8.2*  --   MG  --   --  2.3  --   --   --   AST 62*  --   --   --   --   --   ALT 26  --   --   --   --   --   ALKPHOS 60  --   --   --   --   --   BILITOT 1.1  --   --   --   --   --    < > = values in this interval not displayed.     Microbiology Results  Results for orders placed or performed during the hospital encounter of 01/02/18  Blood Culture (routine x 2)     Status: Abnormal   Collection Time: 01/02/18  9:53 PM  Result Value Ref Range Status   Specimen Description   Final    BLOOD LEFT WRIST Performed at [facility], [place],  [place], [place] [address]    Special Requests   Final    BOTTLES DRAWN AEROBIC AND ANAEROBIC Blood Culture results may not be optimal due to an inadequate volume of blood received in culture bottles Performed at [facility], [address] [place], [place] [address]    Culture  Setup Time   Final    GRAM POSITIVE COCCI ANAEROBIC BOTTLE ONLY CRITICAL VALUE NOTED.  VALUE IS CONSISTENT WITH PREVIOUSLY REPORTED AND CALLED VALUE. Performed at [facility], [address] [place], [place] [address]    Culture (A)  Final    VIRIDANS STREPTOCOCCUS THE SIGNIFICANCE OF ISOLATING THIS ORGANISM FROM A SINGLE SET OF BLOOD CULTURES WHEN MULTIPLE SETS ARE DRAWN IS UNCERTAIN. PLEASE NOTIFY THE MICROBIOLOGY DEPARTMENT WITHIN ONE WEEK IF  SPECIATION AND SENSITIVITIES ARE REQUIRED. Performed at [facility], [place] [address] [place], [place] [address]    Report Status 01/06/2018 FINAL  Final  Blood Culture (routine x 2)     Status: Abnormal   Collection Time: 01/02/18  9:53 PM  Result Value Ref Range Status   Specimen Description   Final    BLOOD LEFT ANTECUBITAL Performed at [facility], [address] [place], [place] [address]    Special Requests   Final    BOTTLES DRAWN AEROBIC AND ANAEROBIC Blood Culture results may not be optimal due to an inadequate volume of blood received in culture bottles Performed at [facility], [address] [place], [place] [address]    Culture  Setup Time   Final    GRAM POSITIVE COCCI IN BOTH AEROBIC AND ANAEROBIC BOTTLES CRITICAL RESULT CALLED TO, READ BACK BY AND VERIFIED WITH: Dicie Beam 01/03/18 @ 1055  [place] Performed at [facility], [place] [address] [place], [place] [address]    Culture GROUP B STREP(S.AGALACTIAE)ISOLATED (A)  Final   Report Status 01/05/2018 FINAL  Final   Organism ID, Bacteria GROUP B STREP(S.AGALACTIAE)ISOLATED  Final      Susceptibility   Group b strep(s.agalactiae)isolated - MIC*    CLINDAMYCIN >=1 RESISTANT Resistant     AMPICILLIN <=0.25 SENSITIVE Sensitive     ERYTHROMYCIN >=8 RESISTANT Resistant     VANCOMYCIN 0.5 SENSITIVE Sensitive     CEFTRIAXONE <=0.12 SENSITIVE Sensitive     LEVOFLOXACIN 1 SENSITIVE Sensitive     PENICILLIN Value in next row Sensitive      SENSITIVE0.12    * GROUP B STREP(S.AGALACTIAE)ISOLATED  Blood Culture ID Panel (Reflexed)     Status: Abnormal   Collection Time: 01/02/18  9:53 PM  Result Value Ref Range Status   Enterococcus species NOT DETECTED NOT DETECTED Final   Listeria monocytogenes NOT DETECTED NOT DETECTED Final   Staphylococcus species NOT DETECTED NOT DETECTED Final   Staphylococcus aureus NOT DETECTED NOT DETECTED Final   Streptococcus species DETECTED (A) NOT DETECTED  Final    Comment: CRITICAL RESULT CALLED TO, READ BACK BY AND VERIFIED WITH: TELDRIN JAMES 01/03/18 @ 1055  [facility]    Streptococcus agalactiae DETECTED (A) NOT DETECTED Final    Comment: CRITICAL RESULT CALLED TO, READ BACK BY AND VERIFIED WITH: TELDRIN JAMES 01/03/18 @ 1055  [facility]    Streptococcus pneumoniae NOT DETECTED NOT DETECTED Final   Streptococcus pyogenes NOT DETECTED NOT DETECTED Final   Acinetobacter baumannii NOT DETECTED NOT DETECTED Final   Enterobacteriaceae species NOT DETECTED NOT DETECTED Final   Enterobacter cloacae complex NOT DETECTED NOT DETECTED Final   Escherichia coli NOT DETECTED NOT  DETECTED Final   Klebsiella oxytoca NOT DETECTED NOT DETECTED Final   Klebsiella pneumoniae NOT DETECTED NOT DETECTED Final   Proteus species NOT DETECTED NOT DETECTED Final   Serratia marcescens NOT DETECTED NOT DETECTED Final   Haemophilus influenzae NOT DETECTED NOT DETECTED Final   Neisseria meningitidis NOT DETECTED NOT DETECTED Final   Pseudomonas aeruginosa NOT DETECTED NOT DETECTED Final   Candida albicans NOT DETECTED NOT DETECTED Final   Candida glabrata NOT DETECTED NOT DETECTED Final   Candida krusei NOT DETECTED NOT DETECTED Final   Candida parapsilosis NOT DETECTED NOT DETECTED Final   Candida tropicalis NOT DETECTED NOT DETECTED Final    Comment: Performed at [facility], [address] [place], [place] [address]  Aerobic/Anaerobic Culture (surgical/deep wound)     Status: None (Preliminary result)   Collection Time: 01/03/18  3:49 AM  Result Value Ref Range Status   Specimen Description   Final    WOUND RIGHT FOOT Performed at [facility], [address], [place], [place] [address]    Special Requests   Final    NONE Performed at [facility], [address] [place], [place] [address]    Gram Stain   Final    RARE WBC PRESENT, PREDOMINANTLY PMN ABUNDANT GRAM POSITIVE COCCI IN PAIRS IN CLUSTERS FEW GRAM NEGATIVE RODS     Culture   Final    ABUNDANT GROUP B STREP(S.AGALACTIAE)ISOLATED WITHIN MIXED CULTURE TESTING AGAINST S. AGALACTIAE NOT ROUTINELY PERFORMED DUE TO PREDICTABILITY OF AMP/PEN/VAN SUSCEPTIBILITY. Performed at [facility], [place] [address], [place], [place] [address]    Report Status PENDING  Incomplete  Aerobic/Anaerobic Culture (surgical/deep wound)     Status: None (Preliminary result)   Collection Time: 01/04/18  8:59 AM  Result Value Ref Range Status   Specimen Description WOUND RIGHT FOOT  Final   Special Requests PATIENT ON FOLLOWING VANC CEFEPIME FLAGYL  Final   Gram Stain   Final    FEW WBC PRESENT, PREDOMINANTLY PMN ABUNDANT GRAM POSITIVE COCCI IN PAIRS IN CLUSTERS Performed at [facility], [place] [address], [place], [place] [address]    Culture   Final    MODERATE GROUP B STREP(S.AGALACTIAE)ISOLATED TESTING AGAINST S. AGALACTIAE NOT ROUTINELY PERFORMED DUE TO PREDICTABILITY OF AMP/PEN/VAN SUSCEPTIBILITY. MODERATE ENTEROCOCCUS FAECALIS WITHIN MIXED CULTURE NO ANAEROBES ISOLATED; CULTURE IN PROGRESS FOR 5 DAYS    Report Status PENDING  Incomplete   Organism ID, Bacteria ENTEROCOCCUS FAECALIS  Final      Susceptibility   Enterococcus faecalis - MIC*    AMPICILLIN <=2 SENSITIVE Sensitive     VANCOMYCIN 1 SENSITIVE Sensitive     GENTAMICIN SYNERGY SENSITIVE Sensitive     * MODERATE ENTEROCOCCUS FAECALIS  Urine culture     Status: None   Collection Time: 01/04/18 11:59 PM  Result Value Ref Range Status   Specimen Description   Final    URINE, RANDOM Performed at [facility], [address] [place], [place] [address]    Special Requests   Final    NONE Performed at [facility], [address] [place],  [address]    Culture   Final    NO GROWTH Performed at [facility], [place] [address], [place],  [address]    Report Status 01/06/2018 FINAL  Final  CULTURE, BLOOD (ROUTINE X 2) w Reflex to ID Panel     Status: None  (Preliminary result)   Collection Time: 01/05/18  4:25 PM  Result Value Ref Range Status   Specimen Description BLOOD BLOOD RIGHT HAND  Final   Special Requests   Final    BOTTLES DRAWN AEROBIC AND ANAEROBIC Blood Culture adequate volume   Culture   Final    NO GROWTH 4 DAYS Performed at [facility], [address], [place], [place] [address]    Report Status PENDING  Incomplete  CULTURE, BLOOD (ROUTINE X 2) w Reflex to ID Panel     Status: None (Preliminary result)   Collection Time: 01/05/18  5:16 PM  Result Value Ref Range Status   Specimen Description BLOOD BLOOD RIGHT HAND  Final   Special Requests   Final    BOTTLES DRAWN AEROBIC AND ANAEROBIC Blood Culture adequate volume   Culture   Final    NO GROWTH 4 DAYS Performed at [facility], [address], [place], [place] [address]    Report Status PENDING  Incomplete    RADIOLOGY:  No results found.   Management plans discussed with the patient, family and they are in agreement.  CODE STATUS: Full Code   TOTAL TIME TAKING CARE OF THIS PATIENT: 36 minutes.    Demetrios Loll M.D on 01/09/2018 at 12:11 PM  Between 7am to 6pm - Pager - [phone]  After 6pm go to [email or web address] - [employer] [facility]  Office  [phone]  CC: Primary care physician; Clarisse Gouge, MD   Note: This dictation was prepared with Dragon dictation along with smaller phrase technology. Any transcriptional errors that result from this process are unintentional.

## 2018-01-09 NOTE — Care Management Note (Signed)
Case Management Note  Patient Details  Name: [name] [name] MRN: [id number] Date of Birth: 29-Jun-1960  Subjective/Objective:  Spoke with patient. He prefers [facility] his [facility]. Will need RN and PT. Spoke with [facility]. Wound vac to be delivered this morning. Home IV antibiotics have been arranged and set up to start today at 4 pm. Patient updated. Corene Cornea with [facility] to speak with patient further. No DME needs. Patient ready for discharge once wound vac delivered. Primary nurse updated.                   Action/Plan:   Expected Discharge Date:  01/09/18               Expected Discharge Plan:  [place]  In-House Referral:     Discharge planning Services  CM Consult  Post Acute Care Choice:  Durable Medical Equipment, Home Health Choice offered to:  Patient  DME Arranged:    DME Agency:  [place] Arranged:  RN, PT [name] Agency:  [place]  Status of Service:  Completed, signed off  If discussed at [place] of Stay Meetings, dates discussed:    Additional Comments:  Jolly Mango, RN 01/09/2018, 9:29 AM

## 2018-01-09 NOTE — Plan of Care (Signed)
  Problem: Health Behavior/Discharge Planning: Goal: Ability to manage health-related needs will improve Outcome: Progressing   Problem: Coping: Goal: Level of anxiety will decrease Outcome: Progressing   Problem: Pain Managment: Goal: General experience of comfort will improve Outcome: Progressing   

## 2018-01-09 NOTE — Progress Notes (Signed)
Patient given discharge instructions with significant other at bedside. Prescriptions also given. Patient wheeled down to family vehicle in stable condition.

## 2018-01-10 LAB — CULTURE, BLOOD (ROUTINE X 2)
Culture: NO GROWTH
Culture: NO GROWTH
Special Requests: ADEQUATE
Special Requests: ADEQUATE

## 2018-01-14 ENCOUNTER — Telehealth: Payer: Self-pay

## 2018-01-14 NOTE — Telephone Encounter (Signed)
EMMI Follow-up: Received a call from [name] [name] as he had received another call from my number.  I let know about our process of 2 automated calls post discharge. He said everything was going fine, he had his Rx's filled and is aware of his follow-up appointments.  No needs noted.

## 2018-01-16 ENCOUNTER — Other Ambulatory Visit: Payer: Self-pay | Admitting: Pharmacist

## 2018-01-16 NOTE — Progress Notes (Signed)
[facility] pharmacy lab review  

## 2018-01-20 ENCOUNTER — Encounter: Payer: Self-pay | Admitting: Podiatry

## 2018-01-29 ENCOUNTER — Encounter: Payer: Self-pay | Admitting: Nurse Practitioner

## 2018-01-29 ENCOUNTER — Ambulatory Visit: Payer: Medicaid Other | Attending: Nurse Practitioner | Admitting: Nurse Practitioner

## 2018-01-29 ENCOUNTER — Other Ambulatory Visit: Payer: Self-pay

## 2018-01-29 VITALS — BP 86/59 | HR 109 | Temp 98.4°F | Resp 18 | Ht 67.0 in | Wt 251.0 lb

## 2018-01-29 DIAGNOSIS — Z9889 Other specified postprocedural states: Secondary | ICD-10-CM | POA: Insufficient documentation

## 2018-01-29 DIAGNOSIS — M79604 Pain in right leg: Secondary | ICD-10-CM

## 2018-01-29 DIAGNOSIS — Z5181 Encounter for therapeutic drug level monitoring: Secondary | ICD-10-CM | POA: Insufficient documentation

## 2018-01-29 DIAGNOSIS — E785 Hyperlipidemia, unspecified: Secondary | ICD-10-CM | POA: Insufficient documentation

## 2018-01-29 DIAGNOSIS — M899 Disorder of bone, unspecified: Secondary | ICD-10-CM

## 2018-01-29 DIAGNOSIS — N179 Acute kidney failure, unspecified: Secondary | ICD-10-CM | POA: Diagnosis not present

## 2018-01-29 DIAGNOSIS — M79671 Pain in right foot: Secondary | ICD-10-CM | POA: Diagnosis not present

## 2018-01-29 DIAGNOSIS — Z720 Tobacco use: Secondary | ICD-10-CM | POA: Insufficient documentation

## 2018-01-29 DIAGNOSIS — E1142 Type 2 diabetes mellitus with diabetic polyneuropathy: Secondary | ICD-10-CM | POA: Diagnosis not present

## 2018-01-29 DIAGNOSIS — J45909 Unspecified asthma, uncomplicated: Secondary | ICD-10-CM | POA: Diagnosis not present

## 2018-01-29 DIAGNOSIS — Z789 Other specified health status: Secondary | ICD-10-CM

## 2018-01-29 DIAGNOSIS — Z79891 Long term (current) use of opiate analgesic: Secondary | ICD-10-CM | POA: Diagnosis not present

## 2018-01-29 DIAGNOSIS — L0390 Cellulitis, unspecified: Secondary | ICD-10-CM | POA: Insufficient documentation

## 2018-01-29 DIAGNOSIS — I5022 Chronic systolic (congestive) heart failure: Secondary | ICD-10-CM | POA: Diagnosis not present

## 2018-01-29 DIAGNOSIS — Z794 Long term (current) use of insulin: Secondary | ICD-10-CM | POA: Insufficient documentation

## 2018-01-29 DIAGNOSIS — E11621 Type 2 diabetes mellitus with foot ulcer: Secondary | ICD-10-CM | POA: Diagnosis not present

## 2018-01-29 DIAGNOSIS — G894 Chronic pain syndrome: Secondary | ICD-10-CM | POA: Insufficient documentation

## 2018-01-29 DIAGNOSIS — T148XXA Other injury of unspecified body region, initial encounter: Secondary | ICD-10-CM | POA: Diagnosis not present

## 2018-01-29 DIAGNOSIS — A419 Sepsis, unspecified organism: Secondary | ICD-10-CM | POA: Insufficient documentation

## 2018-01-29 DIAGNOSIS — Z888 Allergy status to other drugs, medicaments and biological substances status: Secondary | ICD-10-CM | POA: Insufficient documentation

## 2018-01-29 DIAGNOSIS — M25511 Pain in right shoulder: Secondary | ICD-10-CM | POA: Diagnosis present

## 2018-01-29 DIAGNOSIS — I214 Non-ST elevation (NSTEMI) myocardial infarction: Secondary | ICD-10-CM | POA: Diagnosis not present

## 2018-01-29 DIAGNOSIS — G8929 Other chronic pain: Secondary | ICD-10-CM | POA: Diagnosis present

## 2018-01-29 DIAGNOSIS — Z6839 Body mass index (BMI) 39.0-39.9, adult: Secondary | ICD-10-CM | POA: Insufficient documentation

## 2018-01-29 DIAGNOSIS — I25118 Atherosclerotic heart disease of native coronary artery with other forms of angina pectoris: Secondary | ICD-10-CM | POA: Diagnosis not present

## 2018-01-29 DIAGNOSIS — Z8249 Family history of ischemic heart disease and other diseases of the circulatory system: Secondary | ICD-10-CM | POA: Insufficient documentation

## 2018-01-29 DIAGNOSIS — J449 Chronic obstructive pulmonary disease, unspecified: Secondary | ICD-10-CM | POA: Insufficient documentation

## 2018-01-29 DIAGNOSIS — Z8349 Family history of other endocrine, nutritional and metabolic diseases: Secondary | ICD-10-CM | POA: Insufficient documentation

## 2018-01-29 DIAGNOSIS — I110 Hypertensive heart disease with heart failure: Secondary | ICD-10-CM | POA: Insufficient documentation

## 2018-01-29 DIAGNOSIS — I890 Lymphedema, not elsewhere classified: Secondary | ICD-10-CM | POA: Diagnosis not present

## 2018-01-29 DIAGNOSIS — M1990 Unspecified osteoarthritis, unspecified site: Secondary | ICD-10-CM | POA: Insufficient documentation

## 2018-01-29 DIAGNOSIS — Z7982 Long term (current) use of aspirin: Secondary | ICD-10-CM | POA: Insufficient documentation

## 2018-01-29 DIAGNOSIS — Z79899 Other long term (current) drug therapy: Secondary | ICD-10-CM | POA: Insufficient documentation

## 2018-01-29 HISTORY — DX: Other chronic pain: G89.29

## 2018-01-29 HISTORY — DX: Pain in right foot: M79.671

## 2018-01-29 IMAGING — CR DG HIP (WITH OR WITHOUT PELVIS) 2-3V*R*
1 series · 3 of 3 positions shown · non-contrast
Comparison: CT dated 03/07/2014

CLINICAL DATA: 54-year-old male with fall and right hip pain.

EXAM:
DG HIP (WITH OR WITHOUT PELVIS) 2-3V RIGHT

[Series 1: dg hip unilat w or w/o pelvis 2-3 views  · non-contrast · 0.14mm/px · 3 of 3 slices shown]
[im 1/3]
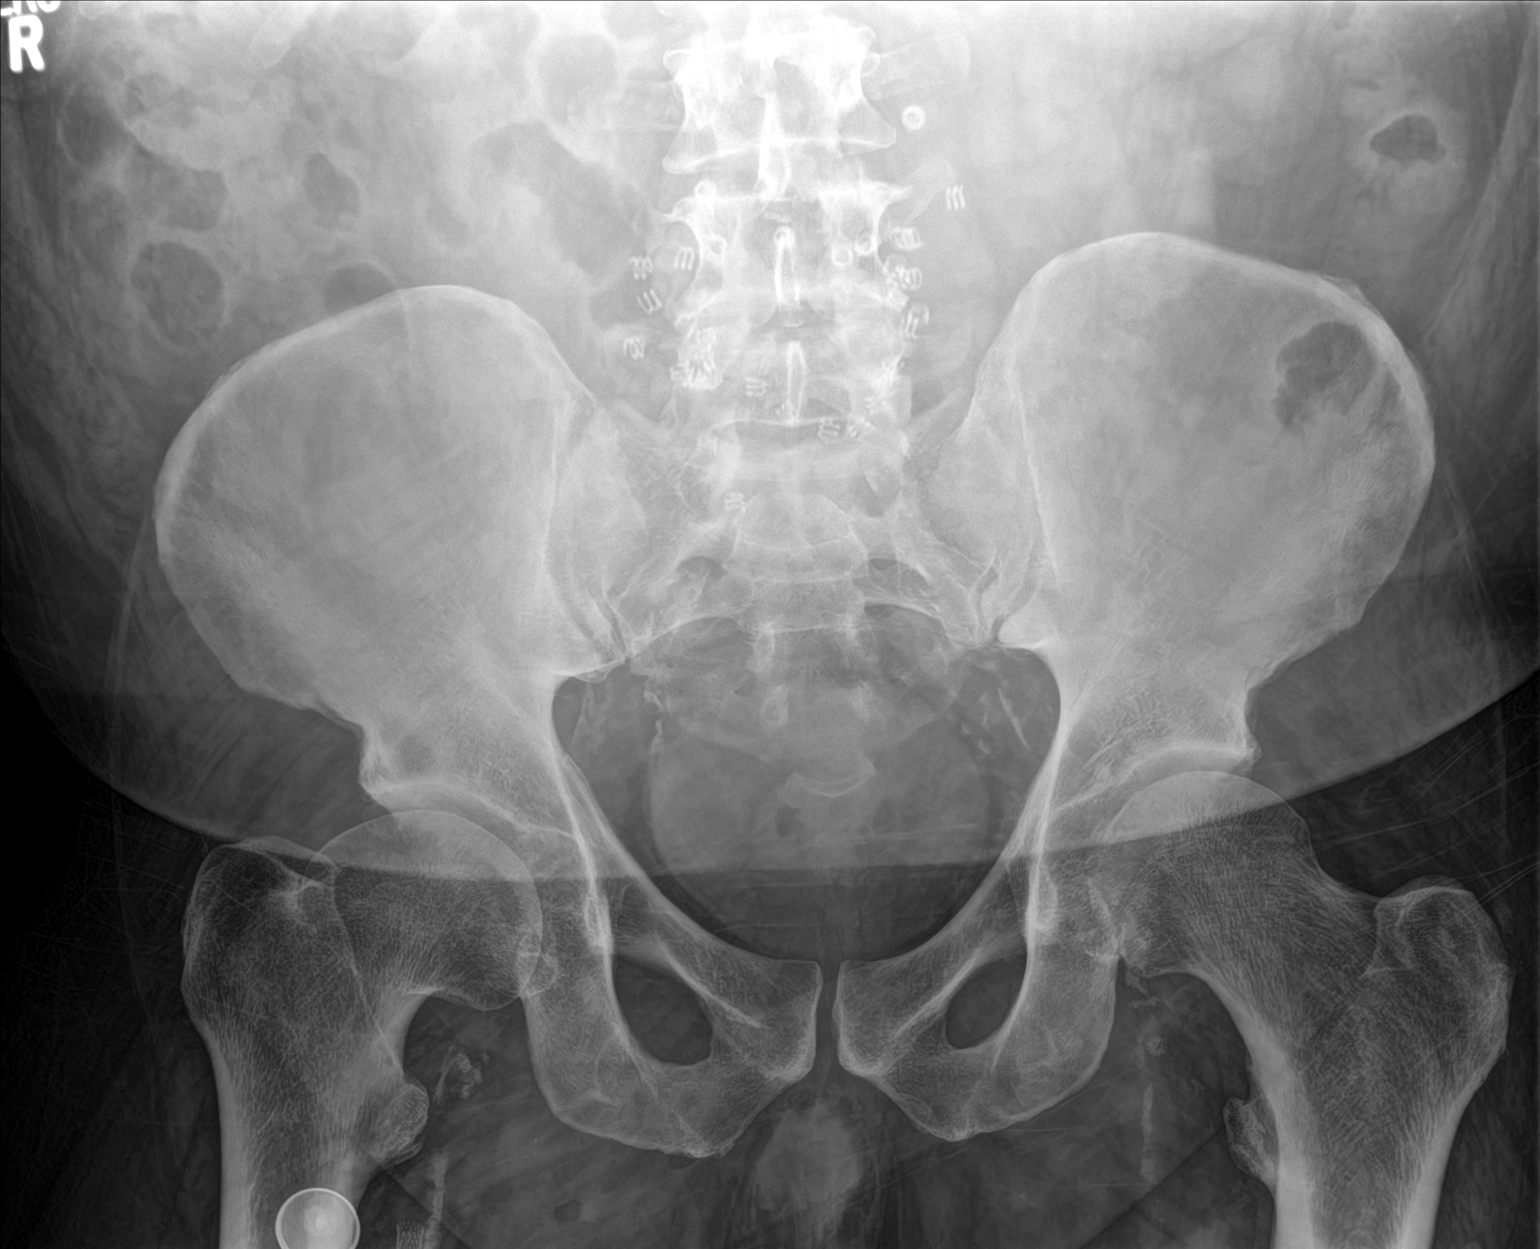
[im 2/3]
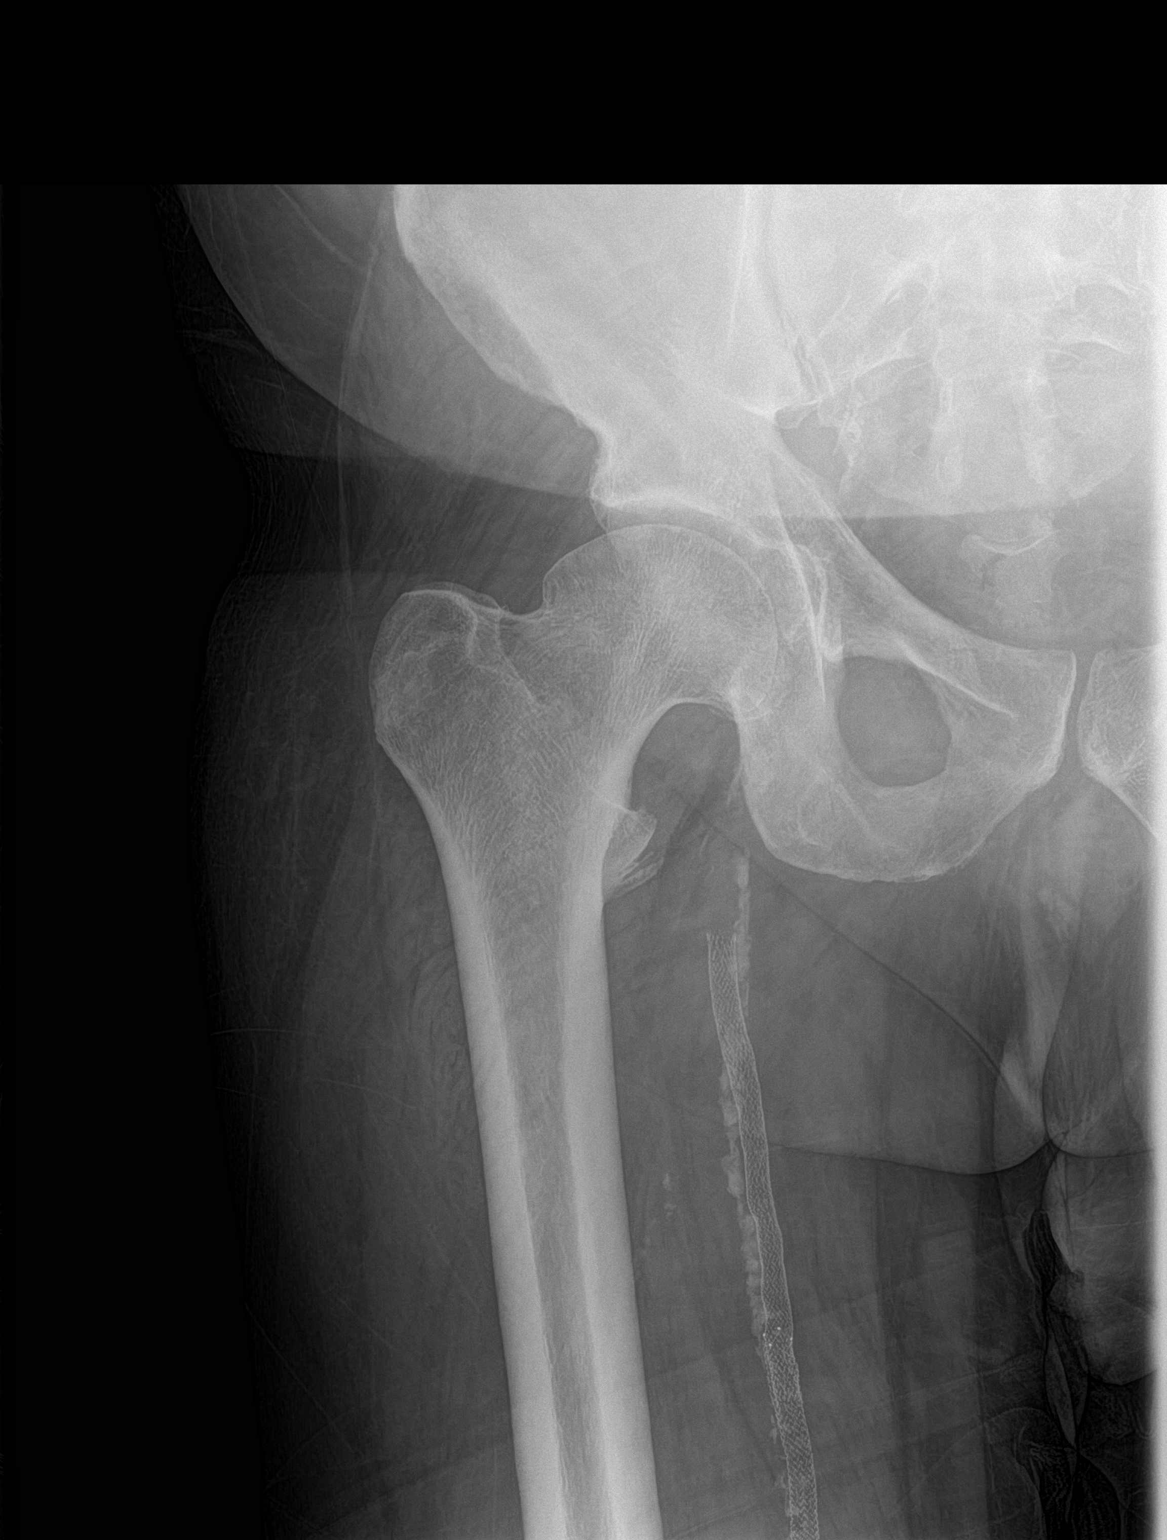
[im 3/3]
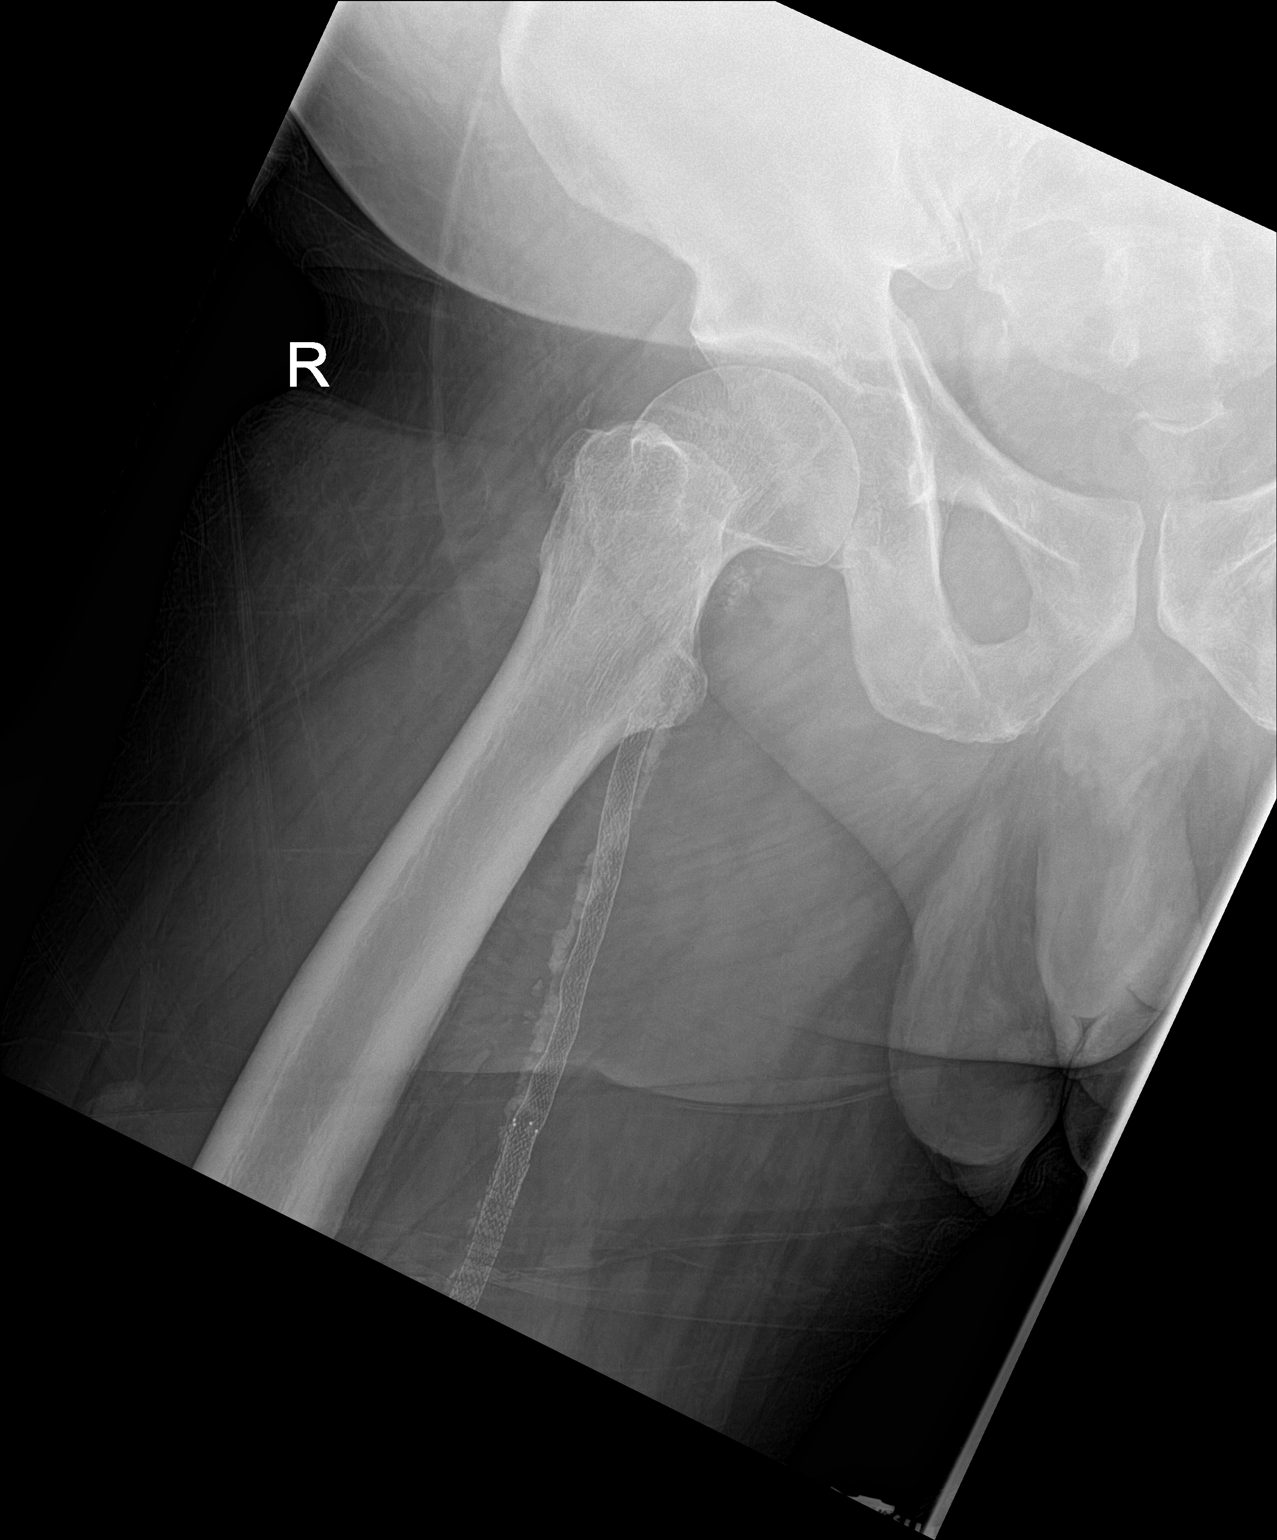

[3 of 3 positions shown; findings below may reference images not displayed]

FINDINGS: There is no acute fracture or dislocation. There is apparent
foreshortening of the right femoral neck similar to the prior CT.
There is stable irregularity of the lesser trochanter. There is mild
osteopenia of the head and neck of the femur bilaterally. There is
mild osteoarthritic changes of the hip joints. Hernia repair mesh
clips noted.

Vascular stent graft noted in the medial aspect of the proximal
right thigh.
IMPRESSION: No acute fracture or dislocation.

## 2018-01-29 NOTE — Progress Notes (Signed)
Safety precautions to be maintained throughout the outpatient stay will include: orient to surroundings, keep bed in low position, maintain call bell within reach at all times, provide assistance with transfer out of bed and ambulation.  

## 2018-01-29 NOTE — Patient Instructions (Signed)
____________________________________________________________________________________________  Appointment Policy Summary  It is our goal and responsibility to provide the medical community with assistance in the evaluation and management of patients with chronic pain. Unfortunately our resources are limited. Because we do not have an unlimited amount of time, or available appointments, we are required to closely monitor and manage their use. The following rules exist to maximize their use:  Patient's responsibilities: 1. Punctuality:  At what time should I arrive? You should be physically present in our office 30 minutes before your scheduled appointment. Your scheduled appointment is with your assigned healthcare provider. However, it takes 5-10 minutes to be "checked-in", and another 15 minutes for the nurses to do the admission. If you arrive to our office at the time you were given for your appointment, you will end up being at least 20-25 minutes late to your appointment with the provider. 2. Tardiness:  What happens if I arrive only a few minutes after my scheduled appointment time? You will need to reschedule your appointment. The cutoff is your appointment time. This is why it is so important that you arrive at least 30 minutes before that appointment. If you have an appointment scheduled for 10:00 AM and you arrive at 10:01, you will be required to reschedule your appointment.  3. Plan ahead:  Always assume that you will encounter traffic on your way in. Plan for it. If you are dependent on a driver, make sure they understand these rules and the need to arrive early. 4. Other appointments and responsibilities:  Avoid scheduling any other appointments before or after your pain clinic appointments.  5. Be prepared:  Write down everything that you need to discuss with your healthcare provider and give this information to the admitting nurse. Write down the medications that you will need  refilled. Bring your pills and bottles (even the empty ones), to all of your appointments, except for those where a procedure is scheduled. 6. No children or pets:  Find someone to take care of them. It is not appropriate to bring them in. 7. Scheduling changes:  We request "advanced notification" of any changes or cancellations. 8. Advanced notification:  Defined as a time period of more than 24 hours prior to the originally scheduled appointment. This allows for the appointment to be offered to other patients. 9. Rescheduling:  When a visit is rescheduled, it will require the cancellation of the original appointment. For this reason they both fall within the category of "Cancellations".  10. Cancellations:  They require advanced notification. Any cancellation less than 24 hours before the  appointment will be recorded as a "No Show". 11. No Show:  Defined as an unkept appointment where the patient failed to notify or declare to the practice their intention or inability to keep the appointment.  Corrective process for repeat offenders:  1. Tardiness: Three (3) episodes of rescheduling due to late arrivals will be recorded as one (1) "No Show". 2. Cancellation or reschedule: Three (3) cancellations or rescheduling will be recorded as one (1) "No Show". 3. "No Shows": Three (3) "No Shows" within a 12 month period will result in discharge from the practice. ____________________________________________________________________________________________  ____________________________________________________________________________________________  Pain Scale  Introduction: The pain score used by this practice is the Verbal Numerical Rating Scale (VNRS-11). This is an 11-point scale. It is for adults and children 10 years or older. There are significant differences in how the pain score is reported, used, and applied. Forget everything you learned in the past and learn  this scoring system.  General  Information: The scale should reflect your current level of pain. Unless you are specifically asked for the level of your worst pain, or your average pain. If you are asked for one of these two, then it should be understood that it is over the past 24 hours.  Basic Activities of Daily Living (ADL): Personal hygiene, dressing, eating, transferring, and using restroom.  Instructions: Most patients tend to report their level of pain as a combination of two factors, their physical pain and their psychosocial pain. This last one is also known as "suffering" and it is reflection of how physical pain affects you socially and psychologically. From now on, report them separately. From this point on, when asked to report your pain level, report only your physical pain. Use the following table for reference.  Pain Clinic Pain Levels (0-5/10)  Pain Level Score  Description  No Pain 0   Mild pain 1 Nagging, annoying, but does not interfere with basic activities of daily living (ADL). Patients are able to eat, bathe, get dressed, toileting (being able to get on and off the toilet and perform personal hygiene functions), transfer (move in and out of bed or a chair without assistance), and maintain continence (able to control bladder and bowel functions). Blood pressure and heart rate are unaffected. A normal heart rate for a healthy adult ranges from 60 to 100 bpm (beats per minute).   Mild to moderate pain 2 Noticeable and distracting. Impossible to hide from other people. More frequent flare-ups. Still possible to adapt and function close to normal. It can be very annoying and may have occasional stronger flare-ups. With discipline, patients may get used to it and adapt.   Moderate pain 3 Interferes significantly with activities of daily living (ADL). It becomes difficult to feed, bathe, get dressed, get on and off the toilet or to perform personal hygiene functions. Difficult to get in and out of bed or a chair  without assistance. Very distracting. With effort, it can be ignored when deeply involved in activities.   Moderately severe pain 4 Impossible to ignore for more than a few minutes. With effort, patients may still be able to manage work or participate in some social activities. Very difficult to concentrate. Signs of autonomic nervous system discharge are evident: dilated pupils (mydriasis); mild sweating (diaphoresis); sleep interference. Heart rate becomes elevated (>115 bpm). Diastolic blood pressure (lower number) rises above 100 mmHg. Patients find relief in laying down and not moving.   Severe pain 5 Intense and extremely unpleasant. Associated with frowning face and frequent crying. Pain overwhelms the senses.  Ability to do any activity or maintain social relationships becomes significantly limited. Conversation becomes difficult. Pacing back and forth is common, as getting into a comfortable position is nearly impossible. Pain wakes you up from deep sleep. Physical signs will be obvious: pupillary dilation; increased sweating; goosebumps; brisk reflexes; cold, clammy hands and feet; nausea, vomiting or dry heaves; loss of appetite; significant sleep disturbance with inability to fall asleep or to remain asleep. When persistent, significant weight loss is observed due to the complete loss of appetite and sleep deprivation.  Blood pressure and heart rate becomes significantly elevated. Caution: If elevated blood pressure triggers a pounding headache associated with blurred vision, then the patient should immediately seek attention at an urgent or emergency care unit, as these may be signs of an impending stroke.    Emergency Department Pain Levels (6-10/10)  Emergency Room Pain 6 Severely   limiting. Requires emergency care and should not be seen or managed at an outpatient pain management facility. Communication becomes difficult and requires great effort. Assistance to reach the emergency department  may be required. Facial flushing and profuse sweating along with potentially dangerous increases in heart rate and blood pressure will be evident.   Distressing pain 7 Self-care is very difficult. Assistance is required to transport, or use restroom. Assistance to reach the emergency department will be required. Tasks requiring coordination, such as bathing and getting dressed become very difficult.   Disabling pain 8 Self-care is no longer possible. At this level, pain is disabling. The individual is unable to do even the most "basic" activities such as walking, eating, bathing, dressing, transferring to a bed, or toileting. Fine motor skills are lost. It is difficult to think clearly.   Incapacitating pain 9 Pain becomes incapacitating. Thought processing is no longer possible. Difficult to remember your own name. Control of movement and coordination are lost.   The worst pain imaginable 10 At this level, most patients pass out from pain. When this level is reached, collapse of the autonomic nervous system occurs, leading to a sudden drop in blood pressure and heart rate. This in turn results in a temporary and dramatic drop in blood flow to the brain, leading to a loss of consciousness. Fainting is one of the body's self defense mechanisms. Passing out puts the brain in a calmed state and causes it to shut down for a while, in order to begin the healing process.    Summary: 1. Refer to this scale when providing [place] with your pain level. 2. Be accurate and careful when reporting your pain level. This will help with your care. 3. Over-reporting your pain level will lead to loss of credibility. 4. Even a level of 1/10 means that there is pain and will be treated at our facility. 5. High, inaccurate reporting will be documented as "Symptom Exaggeration", leading to loss of credibility and suspicions of possible secondary gains such as obtaining more narcotics, or wanting to appear disabled, for  fraudulent reasons. 6. Only pain levels of 5 or below will be seen at our facility. 7. Pain levels of 6 and above will be sent to the Emergency Department and the appointment cancelled. ____________________________________________________________________________________________   BMI Assessment: Estimated body mass index is 39.31 kg/m as calculated from the following:   Height as of this encounter: 5\' 7"  (1.702 m).   Weight as of this encounter: 251 lb (113.9 kg).  BMI interpretation table: BMI level Category Range association with higher incidence of chronic pain  <18 kg/m2 Underweight   18.5-24.9 kg/m2 Ideal body weight   25-29.9 kg/m2 Overweight Increased incidence by 20%  30-34.9 kg/m2 Obese (Class I) Increased incidence by 68%  35-39.9 kg/m2 Severe obesity (Class II) Increased incidence by 136%  >40 kg/m2 Extreme obesity (Class III) Increased incidence by 254%   BMI Readings from Last 4 Encounters:  01/29/18 39.31 kg/m  01/06/18 35.87 kg/m  12/23/17 36.57 kg/m  11/23/17 36.07 kg/m   Wt Readings from Last 4 Encounters:  01/29/18 251 lb (113.9 kg)  01/06/18 229 lb (103.9 kg)  12/23/17 233 lb 7.5 oz (105.9 kg)  11/23/17 230 lb 4.8 oz (104.5 kg)

## 2018-01-29 NOTE — Progress Notes (Signed)
Patient's Name: [name] [name]  MRN: [id number]  Referring Provider: Clarisse Gouge, MD  DOB: 1960-12-18  PCP: Clarisse Gouge, MD  DOS: 01/29/2018  Note by: Dionisio David NP  Service setting: Ambulatory outpatient  Specialty: Interventional Pain Management  Location: [facility] (AMB) Pain Management Facility    Patient type: New Patient    Primary Reason(s) for Visit: Initial Patient Evaluation CC: Leg Pain (right) and Shoulder Pain (right)  HPI  [name] [name] is a 57 y.o. year old, male patient, who comes today for an initial evaluation. He has PAD (peripheral artery disease) ([place]); Coronary artery disease of native artery of native heart with stable angina pectoris ([place]); HLD (hyperlipidemia); Hypertension; Tobacco use; Morbid obesity ([place]); Cellulitis; Diabetic foot infection ([place]); Pressure injury of skin; Chest pain; Unstable angina ([place]); Ulcer of right foot ([place]); Stable angina ([place]); Chronic systolic HF (heart failure) ([place]); Type 2 diabetes mellitus with complication, with long-term current use of insulin ([place]); Lymphedema; NSTEMI (non-ST elevated myocardial infarction) ([place]); Sepsis ([place]); AKI (acute kidney injury) ([place]); COPD (chronic obstructive pulmonary disease) ([place]); Diabetic neuropathy ([place]); Angina at rest [facility]); Angina pectoris ([place]); Chronic pain of right lower extremity (Primary Area of Pain); Chronic right shoulder pain [facility] Area of Pain); Chronic foot pain, right (Secondary Area of Pain); Chronic pain syndrome; Long term current use of opiate analgesic; Pharmacologic therapy; Disorder of skeletal system; Problems influencing health status; and Pain in clavicular joint (Fourth Area of Pain) on their problem list.. His primarily concern today is the Leg Pain (right) and Shoulder Pain (right)  Pain Assessment: Location: Right Leg(from above knee through to toes) Onset: More than a month ago Duration: Chronic pain Quality: Constant, Sharp, Throbbing Severity: 8 /10 (subjective,  self-reported pain score)  Note: Reported level is compatible with observation.                          Timing: Constant Modifying factors: medications, elevation BP: (!) 86/59(left ankle 73/36)  HR: (!) 109  Onset and Duration: Gradual and Date of onset: 2015 Cause of pain: diabetic ulcer on right foot Severity: Getting worse, NAS-11 at its worse: 10/10, NAS-11 at its best: 7/10, NAS-11 now: 8/10 and NAS-11 on the average: 7/10 Timing: Not influenced by the time of the day Aggravating Factors: no answer supplied Alleviating Factors: raising legs above my head Associated Problems: Dizziness, Erectile dysfunction, Nausea, Swelling, Weakness, Pain that wakes patient up and Pain that does not allow patient to sleep Quality of Pain: Constant, Deep, Shooting and Throbbing Previous Examinations or Tests: CT scan, MRI scan and X-rays Previous Treatments: The patient denies any treatments  The patient comes into the clinics today for the first time for a chronic pain management evaluation.  According to the patient his primary area of pain is in his right leg and foot.  He admits that 2 years ago he stepped on a sharp rock while not wearing shoes.  [place] that he has numbness tingling and weakness.  He admits that the pain is getting gradually worse.  He denies any surgery.  He admits that he has had a wound VAC.  He has had a recent MRI.  Area of pain is in his clavicle shoulder area. He admits that he does have numbness and tingling in his arm. He admits that he is status post surgery to remove arthritis on his collarbone by Dr. Pat [name].  He denies any interventional therapy physical therapy or recent images.  Today I took the time to provide the patient with information regarding this pain practice. The patient was informed that the practice is divided into two sections: an interventional pain management section, as well as a completely separate and distinct medication management section. I explained  that there are procedure days for interventional therapies, and evaluation days for follow-ups and medication management. Because of the amount of documentation required during both, they are kept separated. This means that there is the possibility that he may be scheduled for a procedure on one day, and medication management the next. I have also informed him that because of staffing and facility limitations, this practice will no longer take patients for medication management only. To illustrate the reasons for this, I gave the patient the example of surgeons, and how inappropriate it would be to refer a patient to his/her care, just to write for the post-surgical antibiotics on a surgery done by a different surgeon.   Because interventional pain management is part of the board-certified specialty for the doctors, the patient was informed that joining this practice means that they are open to any and all interventional therapies. I made it clear that this does not mean that they will be forced to have any procedures done. What this means is that I believe interventional therapies to be essential part of the diagnosis and proper management of chronic pain conditions. Therefore, patients not interested in these interventional alternatives will be better served under the care of a different practitioner.  The patient was also made aware of my Comprehensive Pain Management Safety Guidelines where by joining this practice, they limit all of their nerve blocks and joint injections to those done by our practice, for as long as we are retained to manage their care. Historic Controlled Substance Pharmacotherapy Review  PMP and historical list of controlled substances: Oxycodone 10 mg, oxycodone 5 mg, oxycodone/acetaminophen 10/325 mg, clonazepam 1 mg, oxycodone/acetaminophen 5/325 mg, Viagra Tesson liquid, hydrocodone/Chlorphen ER suspension, zolpidem 10 mg, Lyrica 100 mg, hydrocodone/acetaminophen 5/'325mg'$  Highest  opioid analgesic regimen found: Oxycodone/acetaminophen 10/325 mg 1 tablet every 4 hours (fill date 09/19/2016) oxycodone 60 mg/day Most recent opioid analgesic: Oxycodone 10 mg 1 tablet 4 times daily (fill date 01/16/2018) oxycodone 40 mg/day Current opioid analgesics:Oxycodone 10 mg 1 tablet 4 times daily (fill date 01/16/2018) oxycodone 40 mg/day Highest recorded MME/day: 90 mg/day MME/day: 60 mg/day Medications: The patient did not bring the medication(s) to the appointment, as requested in our "New Patient Package" Pharmacodynamics: Desired effects: Analgesia: The patient reports >50% benefit. Reported improvement in function: The patient reports medication allows him to accomplish basic ADLs. Clinically meaningful improvement in function (CMIF): Sustained CMIF goals met Perceived effectiveness: Described as relatively effective, allowing for increase in activities of daily living (ADL) Undesirable effects: Side-effects or Adverse reactions: None reported Historical Monitoring: The patient  reports that he does not use drugs. List of all UDS Test(s): Lab Results  Component Value Date   MDMA NONE DETECTED 11/21/2017   COCAINSCRNUR NONE DETECTED 11/21/2017   PCPSCRNUR NONE DETECTED 11/21/2017   THCU NONE DETECTED 11/21/2017   List of all Serum Drug Screening Test(s):  No results found for: AMPHSCRSER, BARBSCRSER, BENZOSCRSER, COCAINSCRSER, PCPSCRSER, PCPQUANT, THCSCRSER, CANNABQUANT, OPIATESCRSER, OXYSCRSER, PROPOXSCRSER Historical Background Evaluation: [place] PDMP: Six (6) year initial data search conducted.             [place] Department of public safety, offender search: Editor, commissioning Information) Non-contributory Risk Assessment Profile: Aberrant behavior: None observed or detected today Risk factors  for fatal opioid overdose: None identified today Fatal overdose hazard ratio (HR): Calculation deferred Non-fatal overdose hazard ratio (HR): Calculation deferred Risk of opioid abuse or  dependence: 0.7-3.0% with doses ? 36 MME/day and 6.1-26% with doses ? 120 MME/day. Substance use disorder (SUD) risk level: Pending results of Medical Psychology Evaluation for SUD Opioid risk tool (ORT) (Total Score): 3  ORT Scoring interpretation table:  Score <3 = Low Risk for SUD  Score between 4-7 = Moderate Risk for SUD  Score >8 = High Risk for Opioid Abuse   PHQ-2 Depression Scale:  Total score: 0  PHQ-2 Scoring interpretation table: (Score and probability of major depressive disorder)  Score 0 = No depression  Score 1 = 15.4% Probability  Score 2 = 21.1% Probability  Score 3 = 38.4% Probability  Score 4 = 45.5% Probability  Score 5 = 56.4% Probability  Score 6 = 78.6% Probability   PHQ-9 Depression Scale:  Total score: 0  PHQ-9 Scoring interpretation table:  Score 0-4 = No depression  Score 5-9 = Mild depression  Score 10-14 = Moderate depression  Score 15-19 = Moderately severe depression  Score 20-27 = Severe depression (2.4 times higher risk of SUD and 2.89 times higher risk of overuse)   Pharmacologic Plan: Pending ordered tests and/or consults  Meds  The patient has a current medication list which includes the following prescription(s): albuterol, ampicillin, aspirin ec, atorvastatin, clopidogrel, gabapentin, insulin glargine, insulin lispro, ipratropium-albuterol, metformin, nicotine, nitroglycerin, trazodone, multivitamin-lutein, and nutrition supplement (juven).  Current Outpatient Medications on File Prior to Visit  Medication Sig  . albuterol (PROVENTIL HFA;VENTOLIN HFA) 108 (90 BASE) MCG/ACT inhaler Inhale 2 puffs into the lungs every 6 (six) hours as needed for wheezing or shortness of breath.  [name] ampicillin IVPB Inject 2 g into the vein every 4 (four) hours for 24 days. Indication:  Bacteremia, osteomyelitis, cellulitis Last Day of Therapy:  02/05/18 Labs - Once weekly:  CBC/D and BMP, Labs - Every other week:  ESR and CRP  . aspirin EC 81 MG tablet Take 1  tablet (81 mg total) by mouth daily.  [name] atorvastatin (LIPITOR) 80 MG tablet Take 80 mg by mouth daily.  . clopidogrel (PLAVIX) 75 MG tablet Take 1 tablet by mouth daily.  [name] gabapentin (NEURONTIN) 800 MG tablet Take 1 tablet by mouth 4 (four) times daily.  . Insulin Glargine (LANTUS SOLOSTAR) 100 UNIT/ML Solostar Pen Inject 70 Units into the skin at bedtime. (Patient taking differently: Inject 40 Units into the skin at bedtime. )  . insulin lispro (HUMALOG) 100 UNIT/ML injection Inject 0-10 Units into the skin 3 (three) times daily with meals. If BS is 0 to 70 no insulin and eat/glucose tablets/glucose get If BS 71 to 150 great! If BS 151 to 200, 2 Units Humaog If BS 201 to 250, 4 Units Humalog If BS 251 to 300, 6 Units Humalog If BS 301 to 350, 8 Units Humalog If BS 351 to 400, 10 Units Humalog If BS greater than 400, take 10 Units and Contact our office during office hour  . ipratropium-albuterol (DUONEB) 0.5-2.5 (3) MG/3ML SOLN Inhale 3 mLs into the lungs every 6 (six) hours as needed (wheezing/sob).   . metFORMIN (GLUCOPHAGE-XR) 500 MG 24 hr tablet Take 500 mg by mouth daily with breakfast.  . nicotine (NICODERM CQ - DOSED IN MG/24 HOURS) 14 mg/24hr patch Place 1 patch (14 mg total) onto the skin daily.  . nitroGLYCERIN (NITROSTAT) 0.4 MG SL tablet Place 0.4 mg  under the tongue every 5 (five) minutes as needed for chest pain.  . traZODone (DESYREL) 50 MG tablet Take 1 tablet by mouth at bedtime.  . multivitamin-lutein (OCUVITE-LUTEIN) CAPS capsule Take 1 capsule by mouth daily. (Patient not taking: Reported on 01/29/2018)  . nutrition supplement, JUVEN, (JUVEN) PACK Take 1 packet by mouth 2 (two) times daily between meals. (Patient not taking: Reported on 01/29/2018)   No current facility-administered medications on file prior to visit.    Imaging Review  Hip Imaging:  Hip-R DG 2-3 views:  Results for orders placed during the hospital encounter of 07/29/15  DG HIP UNILAT WITH PELVIS 2-3  VIEWS RIGHT   Narrative CLINICAL DATA:  57 year old male with fall and right hip pain.  EXAM: DG HIP (WITH OR WITHOUT PELVIS) 2-3V RIGHT  COMPARISON:  CT dated 03/07/2014  FINDINGS: There is no acute fracture or dislocation. There is apparent foreshortening of the right femoral neck similar to the prior CT. There is stable irregularity of the lesser trochanter. There is mild osteopenia of the head and neck of the femur bilaterally. There is mild osteoarthritic changes of the hip joints. Hernia repair mesh clips noted.  Vascular stent graft noted in the medial aspect of the proximal right thigh.  IMPRESSION: No acute fracture or dislocation.   Electronically Signed   By: Anner Crete M.D.   On: 07/29/2015 19:51   Note: Available results from prior imaging studies were reviewed.        ROS  Cardiovascular History: Heart trouble, High blood pressure, Chest pain and Heart attack ( Date: 2018) Pulmonary or Respiratory History: Lung problems, Wheezing and difficulty taking a deep full breath (Asthma), Difficulty blowing air out (Emphysema), Shortness of breath, Smoking, Snoring  and Temporary stoppage of breathing during sleep Neurological History: No reported neurological signs or symptoms such as seizures, abnormal skin sensations, urinary and/or fecal incontinence, being born with an abnormal open spine and/or a tethered spinal cord Review of Past Neurological Studies:  Results for orders placed or performed during the hospital encounter of 04/09/15  CT Head Wo Contrast   Narrative   CLINICAL DATA:  Acute onset of vertigo.  Nausea.  Initial encounter.  EXAM: CT HEAD WITHOUT CONTRAST  TECHNIQUE: Contiguous axial images were obtained from the base of the skull through the vertex without intravenous contrast.  COMPARISON:  None.  FINDINGS: There is no evidence of acute infarction, mass lesion, or intra- or extra-axial hemorrhage on CT.  The posterior fossa, including  the cerebellum, brainstem and fourth ventricle, is within normal limits. The third and lateral ventricles, and basal ganglia are unremarkable in appearance. The cerebral hemispheres are symmetric in appearance, with normal gray-white differentiation. No mass effect or midline shift is seen.  There is no evidence of fracture; visualized osseous structures are unremarkable in appearance. The visualized portions of the orbits are within normal limits. The paranasal sinuses and mastoid air cells are well-aerated. No significant soft tissue abnormalities are seen.  IMPRESSION: Unremarkable noncontrast CT of the head.   Electronically Signed   By: Garald Balding M.D.   On: 04/09/2015 05:53    Psychological-Psychiatric History: Depressed Gastrointestinal History: Reflux or heatburn Genitourinary History: No reported renal or genitourinary signs or symptoms such as difficulty voiding or producing urine, peeing blood, non-functioning kidney, kidney stones, difficulty emptying the bladder, difficulty controlling the flow of urine, or chronic kidney disease Hematological History: No reported hematological signs or symptoms such as prolonged bleeding, low or poor functioning platelets, bruising  or bleeding easily, hereditary bleeding problems, low energy levels due to low hemoglobin or being anemic Endocrine History: High blood sugar requiring insulin (IDDM) Rheumatologic History: No reported rheumatological signs and symptoms such as fatigue, joint pain, tenderness, swelling, redness, heat, stiffness, decreased range of motion, with or without associated rash Musculoskeletal History: Negative for myasthenia gravis, muscular dystrophy, multiple sclerosis or malignant hyperthermia Work History: Disabled  Allergies  Mr. Yin is allergic to dulaglutide and prednisone.  Laboratory Chemistry  Inflammation Markers Lab Results  Component Value Date   CRP 18.3 (H) 11/21/2015   ESRSEDRATE 41 (H)  11/21/2015   (CRP: Acute Phase) (ESR: Chronic Phase) Renal Function Markers Lab Results  Component Value Date   BUN 16 01/08/2018   CREATININE 0.88 01/09/2018   GFRAA >60 01/09/2018   GFRNONAA >60 01/09/2018   Hepatic Function Markers Lab Results  Component Value Date   AST 62 (H) 01/02/2018   ALT 26 01/02/2018   ALBUMIN 2.6 (L) 01/02/2018   ALKPHOS 60 01/02/2018   Electrolytes Lab Results  Component Value Date   NA 139 01/08/2018   K 3.6 01/08/2018   CL 100 01/08/2018   CALCIUM 8.2 (L) 01/08/2018   MG 2.3 01/06/2018   Neuropathy Markers No results found for: [id number] Bone Pathology Markers Lab Results  Component Value Date   ALKPHOS 60 01/02/2018   CALCIUM 8.2 (L) 01/08/2018   Coagulation Parameters Lab Results  Component Value Date   INR 1.21 01/02/2018   LABPROT 15.2 01/02/2018   APTT 41 (H) 01/02/2018   PLT 348 01/08/2018   Cardiovascular Markers Lab Results  Component Value Date   BNP 206.0 (H) 11/21/2017   HGB 10.6 (L) 01/08/2018   HCT 31.5 (L) 01/08/2018   Note: Lab results reviewed.  [place]  Drug: Mr. Zawistowski  reports that he does not use drugs. Alcohol:  reports that he does not drink alcohol. Tobacco:  reports that he has been smoking cigarettes. He has a 41.00 pack-year smoking history. He has never used smokeless tobacco. Medical:  has a past medical history of Arthritis, Asthma, Atherosclerosis, BPH (benign prostatic hyperplasia), Carotid arterial disease ([place]), Charcot's joint of foot, right, Chronic combined systolic (congestive) and diastolic (congestive) heart failure ([place]), COPD (chronic obstructive pulmonary disease) ([place]), Coronary artery disease, Diabetic neuropathy ([place]), Diabetic ulcer of right foot ([place]), Hernia, Hyperlipidemia, Hypertension, Ischemic cardiomyopathy, Morbid obesity ([place]), Neuropathy, PAD (peripheral artery disease) ([place]), Restless leg syndrome, Sleep apnea, Subclavian artery stenosis, right ([place]), Syncope and collapse,  Tobacco use, Type II diabetes mellitus ([place]), and Varicose vein. Family: family history includes Heart attack in his father; Heart disease in his father; Hyperlipidemia in his father and mother; Hypertension in his father and mother.  Past Surgical History:  Procedure Laterality Date  . ABDOMINAL AORTAGRAM N/A 06/23/2013   Procedure: ABDOMINAL Maxcine Ham;  Surgeon: Wellington Hampshire, MD;  Location: [place] CATH LAB;  Service: Cardiovascular;  Laterality: N/A;  . APPENDECTOMY    . CARDIAC CATHETERIZATION  10/14   [facility]; X1 STENT PROXIMAL RCA  . CARDIAC CATHETERIZATION  02/2010   [facility]  . CARDIAC CATHETERIZATION  02/21/2014   [facility]  . CLAVICLE SURGERY    . HERNIA REPAIR    . IRRIGATION AND DEBRIDEMENT FOOT Right 01/04/2018   Procedure: IRRIGATION AND DEBRIDEMENT FOOT;  Surgeon: Samara Deist, DPM;  Location: [facility] ORS;  Service: Podiatry;  Laterality: Right;  . LEFT HEART CATH AND CORS/GRAFTS ANGIOGRAPHY N/A 04/28/2017   Procedure: LEFT HEART CATH AND CORONARY ANGIOGRAPHY;  Surgeon: [name]  A, MD;  Location: [place] CV LAB;  Service: Cardiovascular;  Laterality: N/A;  . PERIPHERAL ARTERIAL STENT GRAFT     x2 left/right  . PERIPHERAL VASCULAR BALLOON ANGIOPLASTY Right 01/06/2018   Procedure: PERIPHERAL VASCULAR BALLOON ANGIOPLASTY;  Surgeon: Katha Cabal, MD;  Location: [facility] CV LAB;  Service: Cardiovascular;  Laterality: Right;  . PERIPHERAL VASCULAR CATHETERIZATION N/A 07/06/2015   Procedure: Abdominal Aortogram w/Lower Extremity;  Surgeon: Algernon Huxley, MD;  Location: [facility] CV LAB;  Service: Cardiovascular;  Laterality: N/A;  . PERIPHERAL VASCULAR CATHETERIZATION  07/06/2015   Procedure: Lower Extremity Intervention;  Surgeon: Algernon Huxley, MD;  Location: [place] CV LAB;  Service: Cardiovascular;;   Active Ambulatory Problems    Diagnosis Date Noted  . PAD (peripheral artery disease) ([place])   . Coronary artery disease of native artery of native heart with stable  angina pectoris ([place]) 09/10/2017  . HLD (hyperlipidemia) 09/10/2017  . Hypertension   . Tobacco use   . Morbid obesity ([place]) 11/17/2014  . Cellulitis 11/21/2015  . Diabetic foot infection ([place]) 05/05/2016  . Pressure injury of skin 05/06/2016  . Chest pain 07/11/2016  . Unstable angina ([place]) 07/11/2016  . Ulcer of right foot ([place])   . Stable angina (HCC)   . Chronic systolic HF (heart failure) ([place]) 04/23/2017  . Type 2 diabetes mellitus with complication, with long-term current use of insulin ([place]) 01/26/2016  . Lymphedema 05/02/2017  . NSTEMI (non-ST elevated myocardial infarction) ([place]) 11/21/2017  . Sepsis ([place]) 12/22/2017  . AKI (acute kidney injury) ([place]) 01/03/2018  . COPD (chronic obstructive pulmonary disease) ([place]) 05/19/2012  . Diabetic neuropathy ([place]) 05/19/2012  . Angina at rest [facility]) 08/13/2017  . Angina pectoris ([place]) 09/08/2017  . Chronic pain of right lower extremity (Primary Area of Pain) 01/29/2018  . Chronic right shoulder pain (Tertiary Area of Pain) 01/29/2018  . Chronic foot pain, right (Secondary Area of Pain) 01/29/2018  . Chronic pain syndrome 01/29/2018  . Long term current use of opiate analgesic 01/29/2018  . Pharmacologic therapy 01/29/2018  . Disorder of skeletal system 01/29/2018  . Problems influencing health status 01/29/2018  . Pain in clavicular joint (Fourth Area of Pain) 01/29/2018   Resolved Ambulatory Problems    Diagnosis Date Noted  . Hypoxia   . Shortness of breath    Past Medical History:  Diagnosis Date  . Arthritis   . Asthma   . Atherosclerosis   . BPH (benign prostatic hyperplasia)   . Carotid arterial disease ([place])   . Charcot's joint of foot, right   . Chronic combined systolic (congestive) and diastolic (congestive) heart failure ([place])   . Coronary artery disease   . Diabetic ulcer of right foot ([place])   . Hernia   . Hyperlipidemia   . Ischemic cardiomyopathy   . Neuropathy   . Restless leg syndrome   . Sleep  apnea   . Subclavian artery stenosis, right ([place])   . Syncope and collapse   . Type II diabetes mellitus ([place])   . Varicose vein    Constitutional Exam  General appearance: Well nourished, well developed, and well hydrated. In no apparent acute distress Vitals:   01/29/18 1420  BP: (!) 86/59  Pulse: (!) 109  Resp: 18  Temp: 98.4 F (36.9 C)  TempSrc: Oral  SpO2: 95%  Weight: 251 lb (113.9 kg)  Height: '5\' 7"'$  (1.702 m)   BMI Assessment: Estimated body mass index is 39.31 kg/m as calculated from the  following:   Height as of this encounter: '5\' 7"'$  (1.702 m).   Weight as of this encounter: 251 lb (113.9 kg).  BMI interpretation table: BMI level Category Range association with higher incidence of chronic pain  <18 kg/m2 Underweight   18.5-24.9 kg/m2 Ideal body weight   25-29.9 kg/m2 Overweight Increased incidence by 20%  30-34.9 kg/m2 Obese (Class I) Increased incidence by 68%  35-39.9 kg/m2 Severe obesity (Class II) Increased incidence by 136%  >40 kg/m2 Extreme obesity (Class III) Increased incidence by 254%   BMI Readings from Last 4 Encounters:  01/29/18 39.31 kg/m  01/06/18 35.87 kg/m  12/23/17 36.57 kg/m  11/23/17 36.07 kg/m   Wt Readings from Last 4 Encounters:  01/29/18 251 lb (113.9 kg)  01/06/18 229 lb (103.9 kg)  12/23/17 233 lb 7.5 oz (105.9 kg)  11/23/17 230 lb 4.8 oz (104.5 kg)  Psych/Mental status: Alert, oriented x 3 (person, place, & time)       Eyes: PERLA Respiratory: No evidence of acute respiratory distress  Cervical Spine Exam  Inspection: No masses, redness, or swelling Alignment: Symmetrical Functional ROM: Unrestricted ROM      Stability: No instability detected Muscle strength & Tone: Functionally intact Sensory: Unimpaired Palpation: No palpable anomalies              Upper Extremity (UE) Exam    Side: Right upper extremity  Side: Left upper extremity  Inspection: No masses, redness, swelling, or asymmetry. No contractures   Inspection: No masses, redness, swelling, or asymmetry. No contractures  Functional ROM: Unrestricted ROM          Functional ROM: Unrestricted ROM          Muscle strength & Tone: Functionally intact  Muscle strength & Tone: Functionally intact  Sensory: Unimpaired  Sensory: Unimpaired  Palpation: No palpable anomalies              Palpation: No palpable anomalies              Specialized Test(s): Deferred         Specialized Test(s): Deferred          Gait & Posture Assessment  Ambulation: Unassisted Gait: Relatively normal for age and body habitus boot worn Posture: WNL   Lower Extremity Exam    Side: Right lower extremity  Side: Left lower extremity  Inspection: Pitting edema boot worn  Inspection: Pitting edema  Functional ROM: Unrestricted ROM          Functional ROM: Unrestricted ROM          Muscle strength & Tone: Functionally intact  Muscle strength & Tone: Functionally intact  Sensory: Unimpaired  Sensory: Unimpaired  Palpation: No palpable anomalies  Palpation: No palpable anomalies   Assessment  Primary Diagnosis & Pertinent Problem List: The primary encounter diagnosis was Chronic foot pain, right (Secondary Area of Pain). 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  Visit Diagnosis: 1. Chronic foot pain, right (Secondary Area of Pain)   2. Chronic pain of right lower extremity (Primary Area of Pain)   3. 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 Arthralgia of right acromioclavicular joint   5. Chronic pain syndrome   6. Long term  current use of opiate analgesic   7. Problems influencing health status   8. Pharmacologic therapy   9.  Disorder of skeletal system    Plan of Care  Initial treatment plan:  Please be advised that as per protocol, today's visit has been an evaluation only. We have not taken over the patient's controlled substance management.  Problem-specific plan: No problem-specific Assessment & Plan notes found for this encounter.  Ordered Lab-work, Procedure(s), Referral(s), & Consult(s): Orders Placed This Encounter  Procedures  . DG Shoulder Right  . DG Clavicle Right  . Comp. Metabolic Panel (12)  . Magnesium  . Vitamin B12  . Sedimentation rate  . 25-Hydroxyvitamin D Lcms D2+D3  . C-reactive protein  . Compliance Drug Analysis, Ur   Pharmacotherapy: Medications ordered:  No orders of the defined types were placed in this encounter.  Medications administered during this visit: Christifer G. Pinder had no medications administered during this visit.   Pharmacotherapy under consideration:  Opioid Analgesics: The patient was informed that there is no guarantee that he would be a candidate for opioid analgesics. The decision will be made following CDC guidelines. This decision will be based on the results of diagnostic studies, as well as Mr. Ra risk profile.  Membrane stabilizer: To be determined at a later time Muscle relaxant: To be determined at a later time NSAID: To be determined at a later time Other analgesic(s): To be determined at a later time   Interventional therapies under consideration: Mr. Knoll was informed that there is no guarantee that he would be a candidate for interventional therapies. The decision will be based on the results of diagnostic studies, as well as Mr. Hynes risk profile.  Possible procedure(s): Diagnostic right suprascapular nerve block Possible right suprascapular nerve radiofrequency ablation Diagnostic right intra-articular shoulder injection   Provider-requested follow-up: Return for 2nd Visit, w/ Dr. Dossie Arbour.  Future Appointments  Date Time  Provider [place]  01/30/2018 11:20 AM [place], MD [facility]  02/05/2018 11:15 AM [place] Callas, NP [facility]  02/16/2018 11:00 AM [facility] None  02/16/2018 11:45 AM Schnier, Dolores Lory, MD [facility] None  02/23/2018 10:30 AM Milinda Pointer, MD [facility] None    Primary Care Physician: Clarisse Gouge, MD Location: [facility] Facility Note by:  Date: 01/29/2018; Time: 4:45 PM  Pain Score Disclaimer: We use the NRS-11 scale. This is a self-reported, subjective measurement of pain severity with only modest accuracy. It is used primarily to identify changes within a particular patient. It must be understood that outpatient pain scales are significantly less accurate that those used for research, where they can be applied under ideal controlled circumstances with minimal exposure to variables. In reality, the score is likely to be a combination of pain intensity and pain affect, where pain affect describes the degree of emotional arousal or changes in action readiness caused by the sensory experience of pain. Factors such as social and work situation, setting, emotional state, anxiety levels, expectation, and prior pain experience may influence pain perception and show large inter-individual differences that may also be affected by time variables.  Patient instructions provided during this appointment: Patient Instructions   ____________________________________________________________________________________________  Appointment Policy Summary  It is our goal and responsibility to provide the medical community with assistance in the evaluation and management of patients with chronic pain. Unfortunately our resources are limited. Because we do not have an unlimited amount of time, or available appointments, we are required  to closely monitor and manage their use. The following rules exist to maximize their use:  Patient's  responsibilities: 1. Punctuality:  At what time should I arrive? You should be physically present in our office 30 minutes before your scheduled appointment. Your scheduled appointment is with your assigned healthcare provider. However, it takes 5-10 minutes to be "checked-in", and another 15 minutes for the nurses to do the admission. If you arrive to our office at the time you were given for your appointment, you will end up being at least 20-25 minutes late to your appointment with the provider. 2. Tardiness:  What happens if I arrive only a few minutes after my scheduled appointment time? You will need to reschedule your appointment. The cutoff is your appointment time. This is why it is so important that you arrive at least 30 minutes before that appointment. If you have an appointment scheduled for 10:00 AM and you arrive at 10:01, you will be required to reschedule your appointment.  3. Plan ahead:  Always assume that you will encounter traffic on your way in. Plan for it. If you are dependent on a driver, make sure they understand these rules and the need to arrive early. 4. Other appointments and responsibilities:  Avoid scheduling any other appointments before or after your pain clinic appointments.  5. Be prepared:  Write down everything that you need to discuss with your healthcare provider and give this information to the admitting nurse. Write down the medications that you will need refilled. Bring your pills and bottles (even the empty ones), to all of your appointments, except for those where a procedure is scheduled. 6. No children or pets:  Find someone to take care of them. It is not appropriate to bring them in. 7. Scheduling changes:  We request "advanced notification" of any changes or cancellations. 8. Advanced notification:  Defined as a time period of more than 24 hours prior to the originally scheduled appointment. This allows for the appointment to be offered to other  patients. 9. Rescheduling:  When a visit is rescheduled, it will require the cancellation of the original appointment. For this reason they both fall within the category of "Cancellations".  10. Cancellations:  They require advanced notification. Any cancellation less than 24 hours before the  appointment will be recorded as a "No Show". 11. No Show:  Defined as an unkept appointment where the patient failed to notify or declare to the practice their intention or inability to keep the appointment.  Corrective process for repeat offenders:  1. Tardiness: Three (3) episodes of rescheduling due to late arrivals will be recorded as one (1) "No Show". 2. Cancellation or reschedule: Three (3) cancellations or rescheduling will be recorded as one (1) "No Show". 3. "No Shows": Three (3) "No Shows" within a 12 month period will result in discharge from the practice. ____________________________________________________________________________________________  ____________________________________________________________________________________________  Pain Scale  Introduction: The pain score used by this practice is the Verbal Numerical Rating Scale (VNRS-11). This is an 11-point scale. It is for adults and children 10 years or older. There are significant differences in how the pain score is reported, used, and applied. Forget everything you learned in the past and learn this scoring system.  General Information: The scale should reflect your current level of pain. Unless you are specifically asked for the level of your worst pain, or your average pain. If you are asked for one of these two, then it should be understood that it is over  the past 24 hours.  Basic Activities of Daily Living (ADL): Personal hygiene, dressing, eating, transferring, and using restroom.  Instructions: Most patients tend to report their level of pain as a combination of two factors, their physical pain and their psychosocial  pain. This last one is also known as "suffering" and it is reflection of how physical pain affects you socially and psychologically. From now on, report them separately. From this point on, when asked to report your pain level, report only your physical pain. Use the following table for reference.  Pain Clinic Pain Levels (0-5/10)  Pain Level Score  Description  No Pain 0   Mild pain 1 Nagging, annoying, but does not interfere with basic activities of daily living (ADL). Patients are able to eat, bathe, get dressed, toileting (being able to get on and off the toilet and perform personal hygiene functions), transfer (move in and out of bed or a chair without assistance), and maintain continence (able to control bladder and bowel functions). Blood pressure and heart rate are unaffected. A normal heart rate for a healthy adult ranges from 60 to 100 bpm (beats per minute).   Mild to moderate pain 2 Noticeable and distracting. Impossible to hide from other people. More frequent flare-ups. Still possible to adapt and function close to normal. It can be very annoying and may have occasional stronger flare-ups. With discipline, patients may get used to it and adapt.   Moderate pain 3 Interferes significantly with activities of daily living (ADL). It becomes difficult to feed, bathe, get dressed, get on and off the toilet or to perform personal hygiene functions. Difficult to get in and out of bed or a chair without assistance. Very distracting. With effort, it can be ignored when deeply involved in activities.   Moderately severe pain 4 Impossible to ignore for more than a few minutes. With effort, patients may still be able to manage work or participate in some social activities. Very difficult to concentrate. Signs of autonomic nervous system discharge are evident: dilated pupils (mydriasis); mild sweating (diaphoresis); sleep interference. Heart rate becomes elevated (>115 bpm). Diastolic blood pressure  (lower number) rises above 100 mmHg. Patients find relief in laying down and not moving.   Severe pain 5 Intense and extremely unpleasant. Associated with frowning face and frequent crying. Pain overwhelms the senses.  Ability to do any activity or maintain social relationships becomes significantly limited. Conversation becomes difficult. Pacing back and forth is common, as getting into a comfortable position is nearly impossible. Pain wakes you up from deep sleep. Physical signs will be obvious: pupillary dilation; increased sweating; goosebumps; brisk reflexes; cold, clammy hands and feet; nausea, vomiting or dry heaves; loss of appetite; significant sleep disturbance with inability to fall asleep or to remain asleep. When persistent, significant weight loss is observed due to the complete loss of appetite and sleep deprivation.  Blood pressure and heart rate becomes significantly elevated. Caution: If elevated blood pressure triggers a pounding headache associated with blurred vision, then the patient should immediately seek attention at an urgent or emergency care unit, as these may be signs of an impending stroke.    Emergency Department Pain Levels (6-10/10)  Emergency Room Pain 6 Severely limiting. Requires emergency care and should not be seen or managed at an outpatient pain management facility. Communication becomes difficult and requires great effort. Assistance to reach the emergency department may be required. Facial flushing and profuse sweating along with potentially dangerous increases in heart rate and blood pressure  will be evident.   Distressing pain 7 Self-care is very difficult. Assistance is required to transport, or use restroom. Assistance to reach the emergency department will be required. Tasks requiring coordination, such as bathing and getting dressed become very difficult.   Disabling pain 8 Self-care is no longer possible. At this level, pain is disabling. The individual is  unable to do even the most "basic" activities such as walking, eating, bathing, dressing, transferring to a bed, or toileting. Fine motor skills are lost. It is difficult to think clearly.   Incapacitating pain 9 Pain becomes incapacitating. Thought processing is no longer possible. Difficult to remember your own name. Control of movement and coordination are lost.   The worst pain imaginable 10 At this level, most patients pass out from pain. When this level is reached, collapse of the autonomic nervous system occurs, leading to a sudden drop in blood pressure and heart rate. This in turn results in a temporary and dramatic drop in blood flow to the brain, leading to a loss of consciousness. Fainting is one of the body's self defense mechanisms. Passing out puts the brain in a calmed state and causes it to shut down for a while, in order to begin the healing process.    Summary: 1. Refer to this scale when providing [place] with your pain level. 2. Be accurate and careful when reporting your pain level. This will help with your care. 3. Over-reporting your pain level will lead to loss of credibility. 4. Even a level of 1/10 means that there is pain and will be treated at our facility. 5. High, inaccurate reporting will be documented as "Symptom Exaggeration", leading to loss of credibility and suspicions of possible secondary gains such as obtaining more narcotics, or wanting to appear disabled, for fraudulent reasons. 6. Only pain levels of 5 or below will be seen at our facility. 7. Pain levels of 6 and above will be sent to the Emergency Department and the appointment cancelled. ____________________________________________________________________________________________   BMI Assessment: Estimated body mass index is 39.31 kg/m as calculated from the following:   Height as of this encounter: '5\' 7"'$  (1.702 m).   Weight as of this encounter: 251 lb (113.9 kg).  BMI interpretation table: BMI level  Category Range association with higher incidence of chronic pain  <18 kg/m2 Underweight   18.5-24.9 kg/m2 Ideal body weight   25-29.9 kg/m2 Overweight Increased incidence by 20%  30-34.9 kg/m2 Obese (Class I) Increased incidence by 68%  35-39.9 kg/m2 Severe obesity (Class II) Increased incidence by 136%  >40 kg/m2 Extreme obesity (Class III) Increased incidence by 254%   BMI Readings from Last 4 Encounters:  01/29/18 39.31 kg/m  01/06/18 35.87 kg/m  12/23/17 36.57 kg/m  11/23/17 36.07 kg/m   Wt Readings from Last 4 Encounters:  01/29/18 251 lb (113.9 kg)  01/06/18 229 lb (103.9 kg)  12/23/17 233 lb 7.5 oz (105.9 kg)  11/23/17 230 lb 4.8 oz (104.5 kg)

## 2018-01-30 ENCOUNTER — Ambulatory Visit: Payer: Medicaid Other | Admitting: Cardiovascular Disease

## 2018-02-03 LAB — COMPLIANCE DRUG ANALYSIS, UR

## 2018-02-04 DIAGNOSIS — Z95828 Presence of other vascular implants and grafts: Secondary | ICD-10-CM | POA: Insufficient documentation

## 2018-02-04 NOTE — Progress Notes (Signed)
Patient: [name] [name]  DOB: 1960-09-19 MRN: [id number] PCP: Clarisse Gouge, MD  Referring Provider: [facility]  Patient Active Problem List   Diagnosis Date Noted  . Peripheral edema 02/05/2018  . Status post peripherally inserted central catheter (PICC) central line placement 02/04/2018  . Chronic pain of right lower extremity (Primary Area of Pain) 01/29/2018  . Chronic right shoulder pain (Tertiary Area of Pain) 01/29/2018  . Chronic foot pain, right (Secondary Area of Pain) 01/29/2018  . Chronic pain syndrome 01/29/2018  . Long term current use of opiate analgesic 01/29/2018  . Medication monitoring encounter 01/29/2018  . Disorder of skeletal system 01/29/2018  . Problems influencing health status 01/29/2018  . Pain in clavicular joint (Fourth Area of Pain) 01/29/2018  . AKI (acute kidney injury) ([place]) 01/03/2018  . NSTEMI (non-ST elevated myocardial infarction) ([place]) 11/21/2017  . Coronary artery disease of native artery of native heart with stable angina pectoris ([place]) 09/10/2017  . HLD (hyperlipidemia) 09/10/2017  . Angina pectoris ([place]) 09/08/2017  . Angina at rest [facility]) 08/13/2017  . Lymphedema 05/02/2017  . Chronic systolic HF (heart failure) ([place]) 04/23/2017  . Chest pain 07/11/2016  . Unstable angina ([place]) 07/11/2016  . Ulcer of right foot ([place])   . Stable angina (HCC)   . Pressure injury of skin 05/06/2016  . Diabetic foot infection ([place]) 05/05/2016  . Type 2 diabetes mellitus with complication, with long-term current use of insulin ([place]) 01/26/2016  . Morbid obesity ([place]) 11/17/2014  . PAD (peripheral artery disease) ([place])   . Hypertension   . Tobacco use   . COPD (chronic obstructive pulmonary disease) ([place]) 05/19/2012  . Diabetic neuropathy ([place]) 05/19/2012     Subjective:   Chief Complaint  Patient presents with  . Hospitalization Follow-up    Right foot ulcer and blood stream infection    HPI/ROS: [name] [name] is an 57 y.o. male. He is here  today for hospital follow up following inpatient admission @ [facility] for polymicrobial gas-producing R midfoot diabetic foot ulcer with secondary bacteremia with group b strep (1/2 and viridans strep 1/2 other bottle). He is s/p I&D with excision of 4th and 5th MT. Operative cultures yielded GBS and e. Faecalis. He was discharged home on 4 weeks of IV ampicillin through 02-04-18. He is also s/p angiogram and angioplasty/stent placement to the R SFA and AKP.   Was seen by podiatry on 8-20: Wound VAC was removed. The plantar ulcer has a granular tissue base that measures 4.5 x 3.0 cm at its largest. Dorsal incision a little bit fibrotic mixed tissue but no purulent drainage. Sutures were removed from this area. VAC was replaced and instructed to return in 4w.   He has feel very fatigued overall in these last few weeks. Feels very swollen and sluggish and has noticed he was up 15 lbs in a short time frame. Denies any fevers/chills. Tolerating antibiotics well but wants picc line out if he can. Spends most of his time in a wheelchair. Wears ortho boot and vac on the right leg and there is pain in the heel.   Review of Systems  Constitutional: Positive for malaise/fatigue. Negative for chills, fever and weight loss.  HENT: Negative for sore throat.        No dental problems  Respiratory: Negative for cough, sputum production and shortness of breath.   Cardiovascular: Positive for leg swelling. Negative for chest pain and orthopnea.  Gastrointestinal: Negative for abdominal pain, diarrhea and  vomiting.  Genitourinary: Negative for dysuria and flank pain.  Musculoskeletal: Positive for joint pain (left leg). Negative for myalgias and neck pain.  Skin: Negative for rash.  Neurological: Negative for dizziness, tingling and headaches.  Psychiatric/Behavioral: Negative for depression and substance abuse. The patient is not nervous/anxious and does not have insomnia.     Past Medical  History:  Diagnosis Date  . Arthritis   . Asthma   . Atherosclerosis   . BPH (benign prostatic hyperplasia)   . Carotid arterial disease ([place])   . Charcot's joint of foot, right   . Chronic combined systolic (congestive) and diastolic (congestive) heart failure ([place])    a. 02/2013 EF 50% by LV gram; b. 04/2017 Echo: EF 25-30%. diff HK. Gr2 DD. Mod dil LA/RV. PASP 46mHg.  .Marland KitchenCOPD (chronic obstructive pulmonary disease) ([place]   . Coronary artery disease    a. 2013 S/P PCI of LAD ([facility];  b. 03/2013 PCI: RCA 90p ( 2.5 x 23 mm DES); c. 02/2014 Cath: patent RCA stent->Med Rx; d. 04/2017 Cath: LM nl, LAd 30ost/p, 647m, D1 80ost, LCX 90p/m, OM1 85, RCA 70/20p, 8014m0d->Referred for CT Surg-felt to be poor candidate.  . Diabetic neuropathy (HCCWagg[place] . Diabetic ulcer of right foot (HCCMarip[place] . Hernia   . Hyperlipidemia   . Hypertension   . Ischemic cardiomyopathy    a. 04/2017 Echo: EF 25-30%; b. 08/2017 Cardiac MRI ([facility]): EF 19%, sev glob HK. RVEF 20%, mod BAE, triv MR, mild-mod TR. Basal lateral subendocardial infarct (viable), inf/infsept, dist septal ischemia, basal to mid lat peri-infarct ischemia.  . Morbid obesity (HCCBeverly . Neuropathy   . PAD (peripheral artery disease) ([place]CCWin[place]  a. Followed by Dr. DewLucky Cowboy. 08/2010 Periph Angio: RSFA 70-80p (6X60 self-expanding stent); c. 09/2010 Periph Angio: L SFA 100p (7x unknown length self-expanding stent); d. 06/2015 Periph Angio: R SFA short segment occlusion (6x12 self-expanding stent).  . Restless leg syndrome   . Sleep apnea   . Subclavian artery stenosis, right (HCCPort Graham . Syncope and collapse   . Tobacco use    a. 75+ yr hx - still smoking 1ppd, down from 2 ppd.  . Type II diabetes mellitus (HCCAtchison . Varicose vein     Outpatient Medications Prior to Visit  Medication Sig Dispense Refill  . albuterol (PROVENTIL HFA;VENTOLIN HFA) 108 (90 BASE) MCG/ACT inhaler Inhale 2 puffs into the lungs every 6 (six) hours as needed for wheezing or shortness of  breath. 1 Inhaler 2  . aspirin EC 81 MG tablet Take 1 tablet (81 mg total) by mouth daily. 90 tablet 3  . atorvastatin (LIPITOR) 80 MG tablet Take 80 mg by mouth daily.    . clopidogrel (PLAVIX) 75 MG tablet Take 1 tablet by mouth daily.    . gMarland Kitchenbapentin (NEURONTIN) 800 MG tablet Take 1 tablet by mouth 4 (four) times daily.    . Insulin Glargine (LANTUS SOLOSTAR) 100 UNIT/ML Solostar Pen Inject 70 Units into the skin at bedtime. (Patient taking differently: Inject 40 Units into the skin at bedtime. ) 15 mL 11  . insulin lispro (HUMALOG) 100 UNIT/ML injection Inject 0-10 Units into the skin 3 (three) times daily with meals. If BS is 0 to 70 no insulin and eat/glucose tablets/glucose get If BS 71 to 150 great! If BS 151 to 200, 2 Units Humaog If BS 201 to 250, 4 Units Humalog If BS 251 to 300, 6 Units Humalog If BS  301 to 350, 8 Units Humalog If BS 351 to 400, 10 Units Humalog If BS greater than 400, take 10 Units and Contact our office during office hour    . ipratropium-albuterol (DUONEB) 0.5-2.5 (3) MG/3ML SOLN Inhale 3 mLs into the lungs every 6 (six) hours as needed (wheezing/sob).     . metFORMIN (GLUCOPHAGE-XR) 500 MG 24 hr tablet Take 500 mg by mouth daily with breakfast.    . multivitamin-lutein (OCUVITE-LUTEIN) CAPS capsule Take 1 capsule by mouth daily. (Patient not taking: Reported on 01/29/2018) 30 capsule 0  . nicotine (NICODERM CQ - DOSED IN MG/24 HOURS) 14 mg/24hr patch Place 1 patch (14 mg total) onto the skin daily. 28 patch 0  . nitroGLYCERIN (NITROSTAT) 0.4 MG SL tablet Place 0.4 mg under the tongue every 5 (five) minutes as needed for chest pain.    [name] nutrition supplement, JUVEN, (JUVEN) PACK Take 1 packet by mouth 2 (two) times daily between meals. (Patient not taking: Reported on 01/29/2018) 30 packet 0  . traZODone (DESYREL) 50 MG tablet Take 1 tablet by mouth at bedtime.     No facility-administered medications prior to visit.      Allergies  Allergen Reactions  .  Dulaglutide Anaphylaxis, Diarrhea and Hives  . Prednisone Rash    Social History   Tobacco Use  . Smoking status: Current Every Day Smoker    Packs/day: 1.00    Years: 41.00    Pack years: 41.00    Types: Cigarettes  . Smokeless tobacco: Never Used  Substance Use Topics  . Alcohol use: No  . Drug use: No    Family History  Problem Relation Age of Onset  . Heart attack Father   . Heart disease Father   . Hypertension Father   . Hyperlipidemia Father   . Hypertension Mother   . Hyperlipidemia Mother     Objective:   Vitals:   02/05/18 1119  Temp: 98.2 F (36.8 C)   There is no height or weight on file to calculate BMI.  Physical Exam  Constitutional: He is oriented to person, place, and time. He appears well-developed and well-nourished.  Seated comfortably in wheelchair during visit.   HENT:  Mouth/Throat: Oropharynx is clear and moist and mucous membranes are normal. Normal dentition. No dental abscesses.  Neck: Neck supple.  Cardiovascular: Normal rate, regular rhythm and normal heart sounds.  Pulmonary/Chest: Effort normal and breath sounds normal.  Abdominal: Soft. He exhibits distension (increased fluid). There is no tenderness.  Musculoskeletal: He exhibits edema (3-4+ edema bilateral legs past knees. ).  R Foot in ortho boot with VAC in place. Unable to visualize site.   Lymphadenopathy:    He has no cervical adenopathy.  Neurological: He is alert and oriented to person, place, and time.  Skin: Skin is warm and dry. Capillary refill takes less than 2 seconds. No rash noted.  Patch of erythematous skin on the left anterior shin that is diffuse. There is no pain, warmth, induration or drainage here.    Psychiatric: He has a normal mood and affect. Judgment normal.  In good spirits today and engaged in care discussion.   Vitals reviewed. RUE SL PICC line in place c/d/i with ampicillin infusing  Lab Results: Lab Results  Component Value Date   WBC 12.6  (H) 01/08/2018   HGB 10.6 (L) 01/08/2018   HCT 31.5 (L) 01/08/2018   MCV 83.6 01/08/2018   PLT 348 01/08/2018    Lab Results  Component Value Date  CREATININE 0.90 01/29/2018   BUN 9 01/29/2018   NA 132 (L) 01/29/2018   K 4.4 01/29/2018   CL 88 (L) 01/29/2018   CO2 31 01/08/2018    Lab Results  Component Value Date   ALT 26 01/02/2018   AST 19 01/29/2018   ALKPHOS 104 01/29/2018   BILITOT 0.6 01/29/2018     Assessment & Plan:   Problem List Items Addressed This Visit      Cardiovascular and Mediastinum   PAD (peripheral artery disease) ([place])    S/P reperfusion intervention.         Endocrine   Type 2 diabetes mellitus with complication, with long-term current use of insulin ([place])   Diabetic foot infection ([place]) - Primary    Podiatry notes reviewed and appears he has had a lot of good progress with wound bed. There was predominantly granulation tissue noted with some drainage and fibrotic tissue. Will continue with 14d of Amox-clav to continue DFU treatment following his polymicrobial infection with GBS and Enterococcus faecalis.      Relevant Orders   C-reactive protein   Basic metabolic panel   Sedimentation rate   CBC     Other   Status post peripherally inserted central catheter (PICC) central line placement    Removed today in clinic - see Sharyn Lull, RN's note.       Peripheral edema    3-4+ today through and beyond knees with ascites on exam. I am sure some of the fluid retention is 2/2 the ampicillin with the sodium he is getting through infusion but I also think he eats a very liberalized unrestricted diet. I reviewed low sodium, discussed nutrition labels today. I advised double dose of lasix/potassium this evening and call cardiology team for further direction as I am sure he will need to do this several more times.   Erythematous patch on left anterior shin. No signs of infection and would presume due more to venous stasis and current fluid over load as  it is not hot or painful. I asked his wife to keep an eye on this.   Check CMET today for lytes/creatinine.   He mentioned to our nurse today that he was going to go get "tomato juice and beer" to celebrate removal of PICC line so I am not certain he will improve his fluid status in the time frame I would like.       Medication monitoring encounter    Labs reviewed while on IV antibiotics - CRP elevated to 50 with normalization of ESR 1 week ago now. Will check today along with CMET and CBC.         Meds ordered this encounter  Medications  . DISCONTD: amoxicillin-clavulanate (AUGMENTIN) 875-125 MG tablet    Sig: Take 1 tablet by mouth 2 (two) times daily for 14 days.    Dispense:  28 tablet    Refill:  0    Order Specific Question:   Supervising Provider    Answer:   HATCHER, JEFFREY C [[id number]]  . amoxicillin-clavulanate (AUGMENTIN) 875-125 MG tablet    Sig: Take 1 tablet by mouth 2 (two) times daily for 14 days.    Dispense:  28 tablet    Refill:  0    Order Specific Question:   Supervising Provider    Answer:   HATCHER, JEFFREY C [[id number]]   Return in about 6 weeks (around 03/19/2018).   Janene Madeira, MSN, NP-C [facility] [place] [facility]  Pager: [phone] Office: [phone]  02/05/18  1:04 PM

## 2018-02-05 ENCOUNTER — Ambulatory Visit (INDEPENDENT_AMBULATORY_CARE_PROVIDER_SITE_OTHER): Payer: Medicaid Other | Admitting: Infectious Diseases

## 2018-02-05 ENCOUNTER — Encounter: Payer: Self-pay | Admitting: Infectious Diseases

## 2018-02-05 VITALS — Temp 98.2°F

## 2018-02-05 DIAGNOSIS — I739 Peripheral vascular disease, unspecified: Secondary | ICD-10-CM | POA: Diagnosis not present

## 2018-02-05 DIAGNOSIS — R609 Edema, unspecified: Secondary | ICD-10-CM | POA: Diagnosis not present

## 2018-02-05 DIAGNOSIS — E11628 Type 2 diabetes mellitus with other skin complications: Secondary | ICD-10-CM | POA: Diagnosis present

## 2018-02-05 DIAGNOSIS — Z5181 Encounter for therapeutic drug level monitoring: Secondary | ICD-10-CM | POA: Diagnosis not present

## 2018-02-05 DIAGNOSIS — Z794 Long term (current) use of insulin: Secondary | ICD-10-CM | POA: Diagnosis not present

## 2018-02-05 DIAGNOSIS — L089 Local infection of the skin and subcutaneous tissue, unspecified: Secondary | ICD-10-CM | POA: Diagnosis not present

## 2018-02-05 DIAGNOSIS — Z95828 Presence of other vascular implants and grafts: Secondary | ICD-10-CM

## 2018-02-05 DIAGNOSIS — E118 Type 2 diabetes mellitus with unspecified complications: Secondary | ICD-10-CM

## 2018-02-05 MED ORDER — AMOXICILLIN-POT CLAVULANATE 875-125 MG PO TABS: 1.0000 | ORAL_TABLET | Freq: Two times a day (BID) | ORAL | 0 refills | Status: AC

## 2018-02-05 MED ORDER — AMOXICILLIN-POT CLAVULANATE 875-125 MG PO TABS: 1.0000 | ORAL_TABLET | Freq: Two times a day (BID) | ORAL | 0 refills | Status: DC

## 2018-02-05 NOTE — Assessment & Plan Note (Signed)
S/P reperfusion intervention.

## 2018-02-05 NOTE — Assessment & Plan Note (Signed)
Removed today in clinic - see Sharyn Lull, RN's note.

## 2018-02-05 NOTE — Assessment & Plan Note (Signed)
3-4+ today through and beyond knees with ascites on exam. I am sure some of the fluid retention is 2/2 the ampicillin with the sodium he is getting through infusion but I also think he eats a very liberalized unrestricted diet. I reviewed low sodium, discussed nutrition labels today. I advised double dose of lasix/potassium this evening and call cardiology team for further direction as I am sure he will need to do this several more times.   Erythematous patch on left anterior shin. No signs of infection and would presume due more to venous stasis and current fluid over load as it is not hot or painful. I asked his wife to keep an eye on this.   Check CMET today for lytes/creatinine.   He mentioned to our nurse today that he was going to go get "tomato juice and beer" to celebrate removal of PICC line so I am not certain he will improve his fluid status in the time frame I would like.

## 2018-02-05 NOTE — Assessment & Plan Note (Signed)
Podiatry notes reviewed and appears he has had a lot of good progress with wound bed. There was predominantly granulation tissue noted with some drainage and fibrotic tissue. Will continue with 14d of Amox-clav to continue DFU treatment following his polymicrobial infection with GBS and Enterococcus faecalis.

## 2018-02-05 NOTE — Patient Instructions (Addendum)
Will stop your Ampicillin today and remove your PICC line.   Please call your cardiology team to discuss your extra fluid - I think you need to take an extra dose of lasix and potassium (if you take this) today and likely over the next few days to get some of this off.   Would try compression stockings and/or elevating your feet above your heart.   Will continue you on 2 weeks more of an antibiotic taken by mouth twice a day called Augmentin. Take this with food to help with any nausea. Can cause diarrhea so please call to let us know if you have more than 3 watery bowel movements in 24 hours.   Please continue working with your podiatry team for wound care.   I would like to see you back in 6 weeks to check in again off antibiotics. Please come back sooner with either myself or Dr. Tommy [name] or any of the providers here to be seen sooner if your wound is looking worse.     [name], garlic powder, or sodium/salt-free seasonings. Do not use any pre-packaged seasoning packets.   Low-Sodium Eating Plan Sodium, which is an element that makes up salt, helps you maintain a healthy balance of fluids in your body. Too much sodium can increase your blood pressure and cause fluid and waste to be held in your body. Your health care provider or dietitian may recommend following this plan if you have high blood pressure (hypertension), kidney disease, liver disease, or heart failure. Eating less sodium can help lower your blood pressure, reduce swelling, and protect your heart, liver, and kidneys. What are tips for following this plan? General guidelines  Most people on this plan should limit their sodium intake to 1,500-2,000 mg (milligrams) of sodium each day. Reading food labels  The Nutrition Facts label lists the amount of sodium in one serving of the food. If you eat more than one serving, you must multiply the listed amount of sodium by the number of servings.  Choose foods with less than 140 mg  of sodium per serving.  Avoid foods with 300 mg of sodium or more per serving. Shopping  Look for lower-sodium products, often labeled as "low-sodium" or "no salt added."  Always check the sodium content even if foods are labeled as "unsalted" or "no salt added".  Buy fresh foods. ? Avoid canned foods and premade or frozen meals. ? Avoid canned, cured, or processed meats  Buy breads that have less than 80 mg of sodium per slice. Cooking  Eat more home-cooked food and less restaurant, buffet, and fast food.  Avoid adding salt when cooking. Use salt-free seasonings or herbs instead of table salt or sea salt. Check with your health care provider or pharmacist before using salt substitutes.  Cook with plant-based oils, such as canola, sunflower, or olive oil. Meal planning  When eating at a restaurant, ask that your food be prepared with less salt or no salt, if possible.  Avoid foods that contain MSG (monosodium glutamate). MSG is sometimes added to [place] food, bouillon, and some canned foods. What foods are recommended? The items listed may not be a complete list. Talk with your dietitian about what dietary choices are best for you. Grains Low-sodium cereals, including oats, puffed wheat and rice, and shredded wheat. Low-sodium crackers. Unsalted rice. Unsalted pasta. Low-sodium bread. Whole-grain breads and whole-grain pasta. Vegetables Fresh or frozen vegetables. "No salt added" canned vegetables. "No salt added" tomato sauce and paste.  Low-sodium or reduced-sodium tomato and vegetable juice. Fruits Fresh, frozen, or canned fruit. Fruit juice. Meats and other protein foods Fresh or frozen (no salt added) meat, poultry, seafood, and fish. Low-sodium canned tuna and salmon. Unsalted nuts. Dried peas, beans, and lentils without added salt. Unsalted canned beans. Eggs. Unsalted nut butters. Dairy Milk. Soy milk. Cheese that is naturally low in sodium, such as ricotta cheese, fresh  mozzarella, or Swiss cheese Low-sodium or reduced-sodium cheese. Cream cheese. Yogurt. Fats and oils Unsalted butter. Unsalted margarine with no trans fat. Vegetable oils such as canola or olive oils. Seasonings and other foods Fresh and dried herbs and spices. Salt-free seasonings. Low-sodium mustard and ketchup. Sodium-free salad dressing. Sodium-free light mayonnaise. Fresh or refrigerated horseradish. Lemon juice. Vinegar. Homemade, reduced-sodium, or low-sodium soups. Unsalted popcorn and pretzels. Low-salt or salt-free chips. What foods are not recommended? The items listed may not be a complete list. Talk with your dietitian about what dietary choices are best for you. Grains Instant hot cereals. Bread stuffing, pancake, and biscuit mixes. Croutons. Seasoned rice or pasta mixes. Noodle soup cups. Boxed or frozen macaroni and cheese. Regular salted crackers. Self-rising flour. Vegetables Sauerkraut, pickled vegetables, and relishes. Olives. [place] fries. Onion rings. Regular canned vegetables (not low-sodium or reduced-sodium). Regular canned tomato sauce and paste (not low-sodium or reduced-sodium). Regular tomato and vegetable juice (not low-sodium or reduced-sodium). Frozen vegetables in sauces. Meats and other protein foods Meat or fish that is salted, canned, smoked, spiced, or pickled. Bacon, ham, sausage, hotdogs, corned beef, chipped beef, packaged lunch meats, salt pork, jerky, pickled herring, anchovies, regular canned tuna, sardines, salted nuts. Dairy Processed cheese and cheese spreads. Cheese curds. Blue cheese. Feta cheese. String cheese. Regular cottage cheese. Buttermilk. Canned milk. Fats and oils Salted butter. Regular margarine. Ghee. Bacon fat. Seasonings and other foods Onion salt, garlic salt, seasoned salt, table salt, and sea salt. Canned and packaged gravies. Worcestershire sauce. Tartar sauce. Barbecue sauce. Teriyaki sauce. Soy sauce, including reduced-sodium.  Steak sauce. Fish sauce. Oyster sauce. Cocktail sauce. Horseradish that you find on the shelf. Regular ketchup and mustard. Meat flavorings and tenderizers. Bouillon cubes. Hot sauce and Tabasco sauce. Premade or packaged marinades. Premade or packaged taco seasonings. Relishes. Regular salad dressings. Salsa. Potato and tortilla chips. Corn chips and puffs. Salted popcorn and pretzels. Canned or dried soups. Pizza. Frozen entrees and pot pies. Summary  Eating less sodium can help lower your blood pressure, reduce swelling, and protect your heart, liver, and kidneys.  Most people on this plan should limit their sodium intake to 1,500-2,000 mg (milligrams) of sodium each day.  Canned, boxed, and frozen foods are high in sodium. Restaurant foods, fast foods, and pizza are also very high in sodium. You also get sodium by adding salt to food.  Try to cook at home, eat more fresh fruits and vegetables, and eat less fast food, canned, processed, or prepared foods. This information is not intended to replace advice given to you by your health care provider. Make sure you discuss any questions you have with your health care provider. Document Released: 11/16/2001 Document Revised: 05/20/2016 Document Reviewed: 05/20/2016 Elsevier Interactive Patient Education  [employer].

## 2018-02-05 NOTE — Assessment & Plan Note (Signed)
Labs reviewed while on IV antibiotics - CRP elevated to 50 with normalization of ESR 1 week ago now. Will check today along with CMET and CBC.

## 2018-02-06 LAB — BASIC METABOLIC PANEL
BUN: 9 mg/dL (ref 7–25)
CO2: 31 mmol/L (ref 20–32)
Calcium: 8.4 mg/dL — ABNORMAL LOW (ref 8.6–10.3)
Chloride: 90 mmol/L — ABNORMAL LOW (ref 98–110)
Creat: 0.87 mg/dL (ref 0.70–1.33)
Glucose, Bld: 190 mg/dL — ABNORMAL HIGH (ref 65–99)
Potassium: 4.6 mmol/L (ref 3.5–5.3)
Sodium: 131 mmol/L — ABNORMAL LOW (ref 135–146)

## 2018-02-06 LAB — COMP. METABOLIC PANEL (12)
AST: 19 IU/L (ref 0–40)
Albumin/Globulin Ratio: 1.2 (ref 1.2–2.2)
Albumin: 3.3 g/dL — ABNORMAL LOW (ref 3.5–5.5)
Alkaline Phosphatase: 104 IU/L (ref 39–117)
BUN/Creatinine Ratio: 10 (ref 9–20)
BUN: 9 mg/dL (ref 6–24)
Bilirubin Total: 0.6 mg/dL (ref 0.0–1.2)
Calcium: 8.7 mg/dL (ref 8.7–10.2)
Chloride: 88 mmol/L — ABNORMAL LOW (ref 96–106)
Creatinine, Ser: 0.9 mg/dL (ref 0.76–1.27)
GFR calc Af Amer: 111 mL/min/{1.73_m2} (ref >59)
GFR calc non Af Amer: 96 mL/min/{1.73_m2} (ref >59)
Globulin, Total: 2.8 g/dL (ref 1.5–4.5)
Glucose: 134 mg/dL — ABNORMAL HIGH (ref 65–99)
Potassium: 4.4 mmol/L (ref 3.5–5.2)
Sodium: 132 mmol/L — ABNORMAL LOW (ref 134–144)
Total Protein: 6.1 g/dL (ref 6.0–8.5)

## 2018-02-06 LAB — 25-HYDROXY VITAMIN D LCMS D2+D3
25-Hydroxy, Vitamin D-2: <1 ng/mL
25-Hydroxy, Vitamin D-3: 18 ng/mL
25-Hydroxy, Vitamin D: 18 ng/mL — ABNORMAL LOW

## 2018-02-06 LAB — MAGNESIUM: Magnesium: 1.9 mg/dL (ref 1.6–2.3)

## 2018-02-06 LAB — C-REACTIVE PROTEIN
CRP: 50 mg/L — ABNORMAL HIGH (ref 0–10)
CRP: 29.1 mg/L — ABNORMAL HIGH (ref <8.0)

## 2018-02-06 LAB — VITAMIN B12: Vitamin B-12: 1017 pg/mL (ref 232–1245)

## 2018-02-06 LAB — SEDIMENTATION RATE
Sed Rate: 26 mm/hr (ref 0–30)
Sed Rate: 6 mm/h (ref 0–20)

## 2018-02-06 LAB — CBC
HCT: 33.5 % — ABNORMAL LOW (ref 38.5–50.0)
Hemoglobin: 10.6 g/dL — ABNORMAL LOW (ref 13.2–17.1)
MCH: 25.8 pg — ABNORMAL LOW (ref 27.0–33.0)
MCHC: 31.6 g/dL — ABNORMAL LOW (ref 32.0–36.0)
MCV: 81.5 fL (ref 80.0–100.0)
MPV: 8.6 fL (ref 7.5–12.5)
Platelets: 303 10*3/uL (ref 140–400)
RBC: 4.11 10*6/uL — ABNORMAL LOW (ref 4.20–5.80)
RDW: 16 % — ABNORMAL HIGH (ref 11.0–15.0)
WBC: 8.3 10*3/uL (ref 3.8–10.8)

## 2018-02-06 NOTE — Progress Notes (Signed)
ESR normalized. CRP improved - I question if this is acutely elevated with all the third spacing he has going on up through his abdomen. Will see if I can add on liver function tests to this sample. BMET confirms fluid over load.

## 2018-02-06 NOTE — Progress Notes (Signed)
Per verbal order from [employer], 42 cm Single Lumen Peripherally Inserted Central Catheter removed from right basilic, tip intact. No sutures present. RN confirmed length per chart. Dressing was clean and dry. Petroleum dressing applied. Pt advised no heavy lifting with this arm, leave dressing for 24 hours and call the office or seek emergent care if dressing becomes soaked with blood or sharp pain presents. Patient verbalized understanding and agreement.  Patient's questions answered to their satisfaction. Patient tolerated procedure well, RN walked patient to check out. RN notified Melissa at [place]. Landis Gandy, RN

## 2018-02-15 DIAGNOSIS — I7025 Atherosclerosis of native arteries of other extremities with ulceration: Secondary | ICD-10-CM | POA: Insufficient documentation

## 2018-02-15 NOTE — Progress Notes (Signed)
MRN : [id number]  [name] [name] is a 57 y.o. (06-28-1960) male who presents with chief complaint of No chief complaint on file. [name] [name]  History of Present Illness:   The patient returns to the office for followup and review status post angiogram with intervention.   Procedure(s) Performed 01/06/2018: 1. Introduction catheter into right lower extremity 3rd order catheter placement  2. Contrast injection right lower extremity for distal runoff   3. Percutaneous transluminal angioplasty and stent placement right superficial femoral artery and above-knee popliteal 4. Percutaneous transluminal angioplasty of the left external iliac artery to 7 mm             5.  Star close closure left common femoral arteriotomy   The patient notes no improvement in the lower extremity symptoms. The rest pain continues. Previous wounds have now healed.  No new ulcers or wounds have occurred since the last visit.  There have been no significant changes to the patient's overall health care.  The patient denies amaurosis fugax or recent TIA symptoms. There are no recent neurological changes noted. The patient denies history of DVT, PE or superficial thrombophlebitis. The patient denies recent episodes of angina or shortness of breath.   ABI's Rt=0.97 and Lt=0.87  (previous ABI's Rt=0.96 and Lt=0.81), no improvement after the angiogram     No outpatient medications have been marked as taking for the 02/16/18 encounter (Appointment) with [name] [name], [name] Lory, MD.    Past Medical History:  Diagnosis Date  . Arthritis   . Asthma   . Atherosclerosis   . BPH (benign prostatic hyperplasia)   . Carotid arterial disease ([place])   . Charcot's joint of foot, right   . Chronic combined systolic (congestive) and diastolic (congestive) heart failure ([name] [name])    a. 02/2013 EF 50% by LV gram; b. 04/2017 Echo: EF 25-30%. diff HK. Gr2 DD. Mod dil LA/RV. PASP 81mmHg.  [name] [name] COPD  (chronic obstructive pulmonary disease) ([place])   . Coronary artery disease    a. 2013 S/P PCI of LAD [facility]);  b. 03/2013 PCI: RCA 90p ( 2.5 x 23 mm DES); c. 02/2014 Cath: patent RCA stent->Med Rx; d. 04/2017 Cath: LM nl, LAd 30ost/p, 22m/d, D1 80ost, LCX 90p/m, OM1 85, RCA 70/20p, 32m, 70d->Referred for CT Surg-felt to be poor candidate.  . Diabetic neuropathy ([name] [name])   . Diabetic ulcer of right foot ([place])   . Hernia   . Hyperlipidemia   . Hypertension   . Ischemic cardiomyopathy    a. 04/2017 Echo: EF 25-30%; b. 08/2017 Cardiac MRI ([facility]): EF 19%, sev glob HK. RVEF 20%, mod BAE, triv MR, mild-mod TR. Basal lateral subendocardial infarct (viable), inf/infsept, dist septal ischemia, basal to mid lat peri-infarct ischemia.  . Morbid obesity ([place])   . Neuropathy   . PAD (peripheral artery disease) ([place])    a. Followed by Dr. Lucky Cowboy; b. 08/2010 Periph Angio: RSFA 70-80p (6X60 self-expanding stent); c. 09/2010 Periph Angio: L SFA 100p (7x unknown length self-expanding stent); d. 06/2015 Periph Angio: R SFA short segment occlusion (6x12 self-expanding stent).  . Restless leg syndrome   . Sleep apnea   . Subclavian artery stenosis, right ([place])   . Syncope and collapse   . Tobacco use    a. 75+ yr hx - still smoking 1ppd, down from 2 ppd.  . Type II diabetes mellitus ([name] [name])   . Varicose vein     Past Surgical History:  Procedure Laterality Date  . ABDOMINAL AORTAGRAM N/A 06/23/2013  Procedure: ABDOMINAL AORTAGRAM;  Surgeon: [name] Hampshire, MD;  Location: [facility] CATH LAB;  Service: Cardiovascular;  Laterality: N/A;  . APPENDECTOMY    . CARDIAC CATHETERIZATION  10/14   [facility]; X1 STENT PROXIMAL RCA  . CARDIAC CATHETERIZATION  02/2010   [facility]  . CARDIAC CATHETERIZATION  02/21/2014   [facility]  . CLAVICLE SURGERY    . HERNIA REPAIR    . IRRIGATION AND DEBRIDEMENT FOOT Right 01/04/2018   Procedure: IRRIGATION AND DEBRIDEMENT FOOT;  Surgeon: [name] [name], DPM;  Location: [facility] ORS;  Service: Podiatry;  Laterality:  Right;  . LEFT HEART CATH AND CORS/GRAFTS ANGIOGRAPHY N/A 04/28/2017   Procedure: LEFT HEART CATH AND CORONARY ANGIOGRAPHY;  Surgeon: [name] Hampshire, MD;  Location: [place] CV LAB;  Service: Cardiovascular;  Laterality: N/A;  . PERIPHERAL ARTERIAL STENT GRAFT     x2 left/right  . PERIPHERAL VASCULAR BALLOON ANGIOPLASTY Right 01/06/2018   Procedure: PERIPHERAL VASCULAR BALLOON ANGIOPLASTY;  Surgeon: [name] Cabal, MD;  Location: [place] CV LAB;  Service: Cardiovascular;  Laterality: Right;  . PERIPHERAL VASCULAR CATHETERIZATION N/A 07/06/2015   Procedure: Abdominal Aortogram w/Lower Extremity;  Surgeon: [name] Huxley, MD;  Location: [place] CV LAB;  Service: Cardiovascular;  Laterality: N/A;  . PERIPHERAL VASCULAR CATHETERIZATION  07/06/2015   Procedure: Lower Extremity Intervention;  Surgeon: [name] Huxley, MD;  Location: [place] CV LAB;  Service: Cardiovascular;;    Social History Social History   Tobacco Use  . Smoking status: Current Every Day Smoker    Packs/day: 1.00    Years: 41.00    Pack years: 41.00    Types: Cigarettes  . Smokeless tobacco: Never Used  Substance Use Topics  . Alcohol use: No  . Drug use: No    Family History Family History  Problem Relation Age of Onset  . Heart attack Father   . Heart disease Father   . Hypertension Father   . Hyperlipidemia Father   . Hypertension Mother   . Hyperlipidemia Mother     Allergies  Allergen Reactions  . Dulaglutide Anaphylaxis, Diarrhea and Hives  . Prednisone Rash     REVIEW OF SYSTEMS (Negative unless checked)  Constitutional: [] Weight loss  [] Fever  [] Chills Cardiac: [] Chest pain   [] Chest pressure   [] Palpitations   [] Shortness of breath when laying flat   [] Shortness of breath with exertion. Vascular:  [x] Pain in legs with walking   [x] Pain in legs at rest  [] History of DVT   [] Phlebitis   [] Swelling in legs   [] Varicose veins   [] Non-healing ulcers Pulmonary:   [] Uses home  oxygen   [] Productive cough   [] Hemoptysis   [] Wheeze  [] COPD   [] Asthma Neurologic:  [] Dizziness   [] Seizures   [] History of stroke   [] History of TIA  [] Aphasia   [] Vissual changes   [] Weakness or numbness in arm   [] Weakness or numbness in leg Musculoskeletal:   [] Joint swelling   [] Joint pain   [] Low back pain Hematologic:  [] Easy bruising  [] Easy bleeding   [] Hypercoagulable state   [] Anemic Gastrointestinal:  [] Diarrhea   [] Vomiting  [] Gastroesophageal reflux/heartburn   [] Difficulty swallowing. Genitourinary:  [] Chronic kidney disease   [] Difficult urination  [] Frequent urination   [] Blood in urine Skin:  [] Rashes   [] Ulcers  Psychological:  [] History of anxiety   []  History of major depression.  Physical Examination  There were no vitals filed for this visit. There is no height or weight on file to calculate BMI. Gen:  WD/WN, NAD Head: [place]/AT, No temporalis wasting.  Ear/Nose/Throat: Hearing grossly intact, nares w/o erythema or drainage Eyes: PER, EOMI, sclera nonicteric.  Neck: Supple, no large masses.   Pulmonary:  Good air movement, no audible wheezing bilaterally, no use of accessory muscles.  Cardiac: RRR, no JVD Vascular: ulcer right foot bland no granulation Vessel Right Left  Radial Palpable Palpable  Popliteal Not Palpable Not Palpable  PT Not Palpable Not Palpable  DP Not Palpable Not Palpable  Gastrointestinal: Non-distended. No guarding/no peritoneal signs.  Musculoskeletal: M/S 5/5 throughout.  No deformity or atrophy.  Neurologic: CN 2-12 intact. Symmetrical.  Speech is fluent. Motor exam as listed above. Psychiatric: Judgment intact, Mood & affect appropriate for pt's clinical situation. Dermatologic: No rashes or ulcers noted.  No changes consistent with cellulitis. Lymph : No lichenification or skin changes of chronic lymphedema.  CBC Lab Results  Component Value Date   WBC 8.3 02/05/2018   HGB 10.6 (L) 02/05/2018   HCT 33.5 (L) 02/05/2018   MCV 81.5  02/05/2018   PLT 303 02/05/2018    BMET    Component Value Date/Time   NA 131 (L) 02/05/2018 1417   NA 132 (L) 01/29/2018 1602   NA 136 06/08/2014 1037   K 4.6 02/05/2018 1417   K 4.3 06/08/2014 1037   CL 90 (L) 02/05/2018 1417   CL 101 06/08/2014 1037   CO2 31 02/05/2018 1417   CO2 27 06/08/2014 1037   GLUCOSE 190 (H) 02/05/2018 1417   GLUCOSE 415 (H) 06/08/2014 1037   BUN 9 02/05/2018 1417   BUN 9 01/29/2018 1602   BUN 9 06/08/2014 1037   CREATININE 0.87 02/05/2018 1417   CALCIUM 8.4 (L) 02/05/2018 1417   CALCIUM 9.2 06/08/2014 1037   GFRNONAA 96 01/29/2018 1602   GFRNONAA >60 06/08/2014 1037   GFRNONAA >60 02/17/2014 0909   GFRAA 111 01/29/2018 1602   GFRAA >60 06/08/2014 1037   GFRAA >60 02/17/2014 0909   CrCl cannot be calculated (Unknown ideal weight.).  COAG Lab Results  Component Value Date   INR 1.21 01/02/2018   INR 1.06 12/22/2017   INR 1.06 11/21/2017    Radiology No results found.   Assessment/Plan 1. Atherosclerosis of native arteries of the extremities with ulceration ([place])  Recommend:  The patient has evidence of severe atherosclerotic changes of both lower extremities associated with ulceration and tissue loss of the right foot.  This represents a limb threatening ischemia and places the patient at the risk for limb loss.  Patient should undergo angiography of the right lower extremities with the hope for intervention for limb salvage.  The risks and benefits as well as the alternative therapies was discussed in detail with the patient.  All questions were answered.  Patient agrees to proceed with angiography.  The patient will follow up with me in the office after the procedure.    2. Coronary artery disease of native artery of native heart with stable angina pectoris ([place]) Continue cardiac and antihypertensive medications as already ordered and reviewed, no changes at this time.  Continue statin as ordered and reviewed, no changes at this  time  Nitrates PRN for chest pain   3. Essential hypertension Continue antihypertensive medications as already ordered, these medications have been reviewed and there are no changes at this time.   4. Type 2 diabetes mellitus with complication, with long-term current use of insulin (H[facility]) Continue hypoglycemic medications as already ordered, these medications have been reviewed and there are no  changes at this time.  Hgb A1C to be monitored as already arranged by primary service     Hortencia Pilar, MD  02/15/2018 4:03 PM

## 2018-02-16 ENCOUNTER — Ambulatory Visit (INDEPENDENT_AMBULATORY_CARE_PROVIDER_SITE_OTHER): Payer: Medicaid Other

## 2018-02-16 ENCOUNTER — Ambulatory Visit (INDEPENDENT_AMBULATORY_CARE_PROVIDER_SITE_OTHER): Payer: Medicaid Other | Admitting: Vascular Surgery

## 2018-02-16 ENCOUNTER — Other Ambulatory Visit (INDEPENDENT_AMBULATORY_CARE_PROVIDER_SITE_OTHER): Payer: Self-pay | Admitting: Vascular Surgery

## 2018-02-16 ENCOUNTER — Encounter (INDEPENDENT_AMBULATORY_CARE_PROVIDER_SITE_OTHER): Payer: Self-pay

## 2018-02-16 ENCOUNTER — Encounter (INDEPENDENT_AMBULATORY_CARE_PROVIDER_SITE_OTHER): Payer: Self-pay | Admitting: Vascular Surgery

## 2018-02-16 VITALS — BP 120/74 | HR 106 | Resp 15 | Ht 69.0 in

## 2018-02-16 DIAGNOSIS — I70239 Atherosclerosis of native arteries of right leg with ulceration of unspecified site: Secondary | ICD-10-CM

## 2018-02-16 DIAGNOSIS — Z95820 Peripheral vascular angioplasty status with implants and grafts: Secondary | ICD-10-CM | POA: Diagnosis not present

## 2018-02-16 DIAGNOSIS — I70249 Atherosclerosis of native arteries of left leg with ulceration of unspecified site: Secondary | ICD-10-CM

## 2018-02-16 DIAGNOSIS — I10 Essential (primary) hypertension: Secondary | ICD-10-CM | POA: Diagnosis not present

## 2018-02-16 DIAGNOSIS — I7025 Atherosclerosis of native arteries of other extremities with ulceration: Secondary | ICD-10-CM | POA: Diagnosis not present

## 2018-02-16 DIAGNOSIS — I25118 Atherosclerotic heart disease of native coronary artery with other forms of angina pectoris: Secondary | ICD-10-CM | POA: Diagnosis not present

## 2018-02-16 DIAGNOSIS — E118 Type 2 diabetes mellitus with unspecified complications: Secondary | ICD-10-CM | POA: Diagnosis not present

## 2018-02-16 DIAGNOSIS — Z794 Long term (current) use of insulin: Secondary | ICD-10-CM

## 2018-02-18 ENCOUNTER — Encounter (INDEPENDENT_AMBULATORY_CARE_PROVIDER_SITE_OTHER): Payer: Self-pay | Admitting: Vascular Surgery

## 2018-02-22 NOTE — Progress Notes (Deleted)
Patient's Name: [name] [name]  MRN: [id number]  Referring Provider: Clarisse Gouge, MD  DOB: 20-Dec-1960  PCP: Clarisse Gouge, MD  DOS: 02/23/2018  Note by: Gaspar Cola, MD  Service setting: Ambulatory outpatient  Specialty: Interventional Pain Management  Location: [facility] (AMB) Pain Management Facility    Patient type: Established   Primary Reason(s) for Visit: Encounter for evaluation before starting new chronic pain management plan of care (Level of risk: moderate) CC: No chief complaint on file.  HPI  [name] [name] is a 57 y.o. year old, male patient, who comes today for a follow-up evaluation to review the test results and decide on a treatment plan. He has PAD (peripheral artery disease) ([place]); Coronary artery disease of native artery of native heart with stable angina pectoris ([place]); HLD (hyperlipidemia); Hypertension; Tobacco use; Morbid obesity ([place]); Diabetic foot infection ([place]); Pressure injury of skin; Chest pain; Unstable angina ([place]); Foot ulcer (Right); Stable angina ([place]); Chronic systolic HF (heart failure) ([place]); Type 2 diabetes mellitus with complication, with long-term current use of insulin ([place]); Lymphedema; NSTEMI (non-ST elevated myocardial infarction) ([place]); AKI (acute kidney injury) ([place]); COPD (chronic obstructive pulmonary disease) ([place]); Diabetic neuropathy ([place]); Angina at rest [facility]); Angina pectoris ([place]); Chronic lower extremity pain (Primary Area of Pain) (Right); Chronic shoulder pain (Tertiary Area of Pain) (Right); Chronic foot pain (Secondary Area of Pain) (Right); Chronic pain syndrome; Long term current use of opiate analgesic; Medication monitoring encounter; Disorder of skeletal system; Problems influencing health status; Status post peripherally inserted central catheter (PICC) central line placement; Peripheral edema; Atherosclerosis of native arteries of the extremities with ulceration ([place]); Abnormal MRI, shoulder (Right); Abnormal MRI, cervical spine  (2016); DDD (degenerative disc disease), cervical; Cervical foraminal stenosis (Bilateral); Cervical central spinal stenosis; Cervical facet hypertrophy; Arthralgia of acromioclavicular joint (Right); Osteoarthritis of  AC (acromioclavicular) joint (Right); Biceps tendinosis of shoulder (Right); Tendinopathy of rotator cuff (Right); and Vitamin D deficiency on their problem list. His primarily concern today is the No chief complaint on file.  Pain Assessment: Location:     Radiating:   Onset:   Duration:   Quality:   Severity:  /10 (subjective, self-reported pain score)  Note: Reported level is compatible with observation.                         When using our objective Pain Scale, levels between 6 and 10/10 are said to belong in an emergency room, as it progressively worsens from a 6/10, described as severely limiting, requiring emergency care not usually available at an outpatient pain management facility. At a 6/10 level, communication becomes difficult and requires great effort. Assistance to reach the emergency department may be required. Facial flushing and profuse sweating along with potentially dangerous increases in heart rate and blood pressure will be evident. Effect on ADL:   Timing:   Modifying factors:   BP:    HR:    [name] [name] comes in today for a follow-up visit after his initial evaluation on 01/29/2018. Today we went over the results of his tests. These were explained in "Layman's terms". During today's appointment we went over my diagnostic impression, as well as the proposed treatment plan.  According to the patient his primary area of pain is in his right leg and foot.  He admits that 2 years ago he stepped on a sharp rock while not wearing shoes.  [place] that he has numbness tingling and weakness.  He admits that the  pain is getting gradually worse.  He denies any surgery.  He admits that he has had a wound VAC.  He has had a recent MRI.  Second area of pain is in his  clavicle shoulder area. He admits that he does have numbness and tingling in his arm. He admits that he is status post surgery to remove arthritis on his collarbone by [name] [name].  He denies any interventional therapy physical therapy or recent images.  In considering the treatment plan options, [name] [name] was reminded that I no longer take patients for medication management only. I asked him to let me know if he had no intention of taking advantage of the interventional therapies, so that we could make arrangements to provide this space to someone interested. I also made it clear that undergoing interventional therapies for the purpose of getting pain medications is very inappropriate on the part of a patient, and it will not be tolerated in this practice. This type of behavior would suggest true addiction and therefore it requires referral to an addiction specialist.   Further details on both, my assessment(s), as well as the proposed treatment plan, please see below.  Controlled Substance Pharmacotherapy Assessment REMS (Risk Evaluation and Mitigation Strategy)  Analgesic: Oxycodone 10 mg 1 tablet 4 times daily (fill date 01/16/2018) oxycodone 40 mg/day Highest recorded MME/day: 90 mg/day MME/day: 60 mg/day Pill Count: None expected due to no prior prescriptions written by our practice. No notes on file Pharmacokinetics: Liberation and absorption (onset of action): WNL Distribution (time to peak effect): WNL Metabolism and excretion (duration of action): WNL         Pharmacodynamics: Desired effects: Analgesia: [name] [name] reports >50% benefit. Functional ability: Patient reports that medication allows him to accomplish basic ADLs Clinically meaningful improvement in function (CMIF): Sustained CMIF goals met Perceived effectiveness: Described as relatively effective, allowing for increase in activities of daily living (ADL) Undesirable effects: Side-effects or Adverse reactions: None  reported Monitoring: [place] PMP: Online review of the past 32-monthperiod previously conducted. Not applicable at this point since we have not taken over the patient's medication management yet. List of other Serum/Urine Drug Screening Test(s):  Lab Results  Component Value Date   COCAINSCRNUR NONE DETECTED 11/21/2017   THCU NONE DETECTED 11/21/2017   List of all UDS test(s) done:  Lab Results  Component Value Date   SUMMARY FINAL 01/29/2018   Last UDS on record: Summary  Date Value Ref Range Status  01/29/2018 FINAL  Final    Comment:    ==================================================================== TOXASSURE COMP DRUG ANALYSIS,UR ==================================================================== Test                             Result       Flag       Units Drug Present and Declared for Prescription Verification   Gabapentin                     PRESENT      EXPECTED Drug Present not Declared for Prescription Verification   Oxycodone                      5564         UNEXPECTED ng/mg creat   Oxymorphone                    8166         UNEXPECTED ng/mg creat  Noroxycodone                   4323         UNEXPECTED ng/mg creat   Noroxymorphone                 3168         UNEXPECTED ng/mg creat    Sources of oxycodone are scheduled prescription medications.    Oxymorphone, noroxycodone, and noroxymorphone are expected    metabolites of oxycodone. Oxymorphone is also available as a    scheduled prescription medication. Drug Absent but Declared for Prescription Verification   Trazodone                      Not Detected UNEXPECTED   Salicylate                     Not Detected UNEXPECTED    Aspirin, as indicated in the declared medication list, is not    always detected even when used as directed. ==================================================================== Test                      Result    Flag   Units      Ref Range   Creatinine              74               mg/dL       >=20 ==================================================================== Declared Medications:  The flagging and interpretation on this report are based on the  following declared medications.  Unexpected results may arise from  inaccuracies in the declared medications.  **Note: The testing scope of this panel includes these medications:  Gabapentin (Neurontin)  Trazodone (Desyrel)  **Note: The testing scope of this panel does not include small to  moderate amounts of these reported medications:  Aspirin (Aspirin 81)  **Note: The testing scope of this panel does not include following  reported medications:  Albuterol (Duoneb)  Albuterol (Proventil)  Ampicillin  Atorvastatin (Lipitor)  Clopidogrel (Plavix)  Insulin (Humalog)  Insulin (Lantus)  Ipratropium (Duoneb)  Metformin (Glucophage)  Multivitamin (Ocuvite)  Nicotine (Nicoderm)  Nitroglycerin (Nitrostat)  Supplement ==================================================================== For clinical consultation, please call [phone]. ====================================================================    UDS interpretation: No unexpected findings.          Medication Assessment Form: Patient introduced to form today Treatment compliance: Treatment may start today if patient agrees with proposed plan. Evaluation of compliance is not applicable at this point Risk Assessment Profile: Aberrant behavior: See initial evaluations. None observed or detected today Comorbid factors increasing risk of overdose: See initial evaluation. No additional risks detected today Opioid risk tool (ORT) (Total Score):   Personal History of Substance Abuse (SUD-Substance use disorder):  Alcohol:    Illegal Drugs:    Rx Drugs:    ORT Risk Level calculation:   Risk of substance use disorder (SUD): Low  ORT Scoring interpretation table:  Score <3 = Low Risk for SUD  Score between 4-7 = Moderate Risk for SUD  Score >8 = High Risk  for Opioid Abuse   Risk Mitigation Strategies:  Patient opioid safety counseling: Completed today. Counseling provided to patient as per "Patient Counseling Document". Document signed by patient, attesting to counseling and understanding Patient-Prescriber Agreement (PPA): Obtained today.  Controlled substance notification to other providers: Written and sent today.  Pharmacologic Plan: Today we may be taking over the patient's pharmacological regimen.  See below.             Laboratory Chemistry  Inflammation Markers (CRP: Acute Phase) (ESR: Chronic Phase) Lab Results  Component Value Date   CRP 29.1 (H) 02/05/2018   ESRSEDRATE 6 02/05/2018   LATICACIDVEN 1.2 01/04/2018                         Rheumatology Markers No results found.  Renal Function Markers Lab Results  Component Value Date   BUN 9 02/05/2018   CREATININE 0.87 29/52/8413   BCR NOT APPLICABLE 24/40/1027   GFRAA 111 01/29/2018   GFRNONAA 96 01/29/2018                             Hepatic Function Markers Lab Results  Component Value Date   AST 19 01/29/2018   ALT 26 01/02/2018   ALBUMIN 3.3 (L) 01/29/2018   ALKPHOS 104 01/29/2018   LIPASE 27 01/02/2018                        Electrolytes Lab Results  Component Value Date   NA 131 (L) 02/05/2018   K 4.6 02/05/2018   CL 90 (L) 02/05/2018   CALCIUM 8.4 (L) 02/05/2018   MG 1.9 01/29/2018                        Neuropathy Markers Lab Results  Component Value Date   VITAMINB12 1,017 01/29/2018   HGBA1C 8.5 (H) 11/21/2017   HIV Non Reactive 04/23/2017                        CNS Tests No results found.  Bone Pathology Markers Lab Results  Component Value Date   25OHVITD1 18 (L) 01/29/2018   25OHVITD2 <1.0 01/29/2018   25OHVITD3 18 01/29/2018                         Coagulation Parameters Lab Results  Component Value Date   INR 1.21 01/02/2018   LABPROT 15.2 01/02/2018   APTT 41 (H) 01/02/2018   PLT 303 02/05/2018                         Cardiovascular Markers Lab Results  Component Value Date   BNP 206.0 (H) 11/21/2017   CKTOTAL 202 11/21/2017   CKMB 4.0 (H) 04/06/2013   TROPONINI 2.17 (HH) 01/04/2018   HGB 10.6 (L) 02/05/2018   HCT 33.5 (L) 02/05/2018                         CA Markers No results found.  Note: Lab results reviewed.  Recent Diagnostic Imaging Review  Hip Imaging: Hip-R DG 2-3 views:  Results for orders placed during the hospital encounter of 07/29/15  DG HIP UNILAT WITH PELVIS 2-3 VIEWS RIGHT   Narrative CLINICAL DATA:  57 year old male with fall and right hip pain.  EXAM: DG HIP (WITH OR WITHOUT PELVIS) 2-3V RIGHT  COMPARISON:  CT dated 03/07/2014  FINDINGS: There is no acute fracture or dislocation. There is apparent foreshortening of the right femoral neck similar to the prior CT. There is stable irregularity of the lesser trochanter. There is mild osteopenia of the head and neck of the femur bilaterally. There is mild osteoarthritic changes  of the hip joints. Hernia repair mesh clips noted.  Vascular stent graft noted in the medial aspect of the proximal right thigh.  IMPRESSION: No acute fracture or dislocation.   Electronically Signed   By: Anner Crete M.D.   On: 07/29/2015 19:51    Foot Imaging: Foot-R DG Complete:  Results for orders placed during the hospital encounter of 01/02/18  DG Foot Complete Right   Narrative CLINICAL DATA:  Diabetic ulcer at the base of the fifth metatarsal bone.  EXAM: RIGHT FOOT COMPLETE - 3+ VIEW  COMPARISON:  Right foot radiograph 12/22/2017. MRI right foot 12/23/2017  FINDINGS: Large soft tissue ulceration over the plantar surface of the lateral right foot at the level of the fifth metatarsal base. This was present on the previous study. In the interval, there is development of soft tissue gas along the dorsum of the right foot. This likely reflects progression of infection with gas-forming organism. No definite  radiographic changes to suggest osteomyelitis, but changes of osteomyelitis were demonstrated in the fourth and fifth metatarsal bones on the previous MRI. Degenerative changes in the interphalangeal and intertarsal joints. Plantar calcaneal spur. No radiopaque soft tissue foreign bodies.  IMPRESSION: Large soft tissue ulceration over the plantar surface of the right foot at the level of the fifth metatarsal base as seen previously. New soft tissue gas along the dorsum of the right foot likely representing progression of infection with gas-forming organism. No radiographic evidence of osteomyelitis, but previous MRI showed evidence of osteomyelitis in the fourth and fifth metatarsal bones.   Electronically Signed   By: Lucienne Capers M.D.   On: 01/02/2018 22:46    Complexity Note: Imaging results reviewed. Results shared with Mr. Coldwell, using Layman's terms.                         Meds   Current Outpatient Medications:  .  albuterol (PROVENTIL HFA;VENTOLIN HFA) 108 (90 BASE) MCG/ACT inhaler, Inhale 2 puffs into the lungs every 6 (six) hours as needed for wheezing or shortness of breath., Disp: 1 Inhaler, Rfl: 2 .  aspirin EC 81 MG tablet, Take 1 tablet (81 mg total) by mouth daily., Disp: 90 tablet, Rfl: 3 .  atorvastatin (LIPITOR) 80 MG tablet, Take 80 mg by mouth daily., Disp: , Rfl:  .  clopidogrel (PLAVIX) 75 MG tablet, Take 1 tablet by mouth daily., Disp: , Rfl:  .  ferrous sulfate 325 (65 FE) MG tablet, Take by mouth., Disp: , Rfl:  .  furosemide (LASIX) 40 MG tablet, Take by mouth., Disp: , Rfl:  .  gabapentin (NEURONTIN) 800 MG tablet, Take 1 tablet by mouth 4 (four) times daily., Disp: , Rfl:  .  Insulin Glargine (LANTUS SOLOSTAR) 100 UNIT/ML Solostar Pen, Inject 70 Units into the skin at bedtime. (Patient taking differently: Inject 40 Units into the skin at bedtime. ), Disp: 15 mL, Rfl: 11 .  insulin lispro (HUMALOG) 100 UNIT/ML injection, Inject 0-10 Units into the  skin 3 (three) times daily with meals. If BS is 0 to 70 no insulin and eat/glucose tablets/glucose get If BS 71 to 150 great! If BS 151 to 200, 2 Units Humaog If BS 201 to 250, 4 Units Humalog If BS 251 to 300, 6 Units Humalog If BS 301 to 350, 8 Units Humalog If BS 351 to 400, 10 Units Humalog If BS greater than 400, take 10 Units and Contact our office during office hour, Disp: , Rfl:  .  Insulin Pen Needle (B-D ULTRAFINE III SHORT PEN) 31G X 8 MM MISC, as directed. Check blood sugars tid, Disp: , Rfl:  .  ipratropium-albuterol (DUONEB) 0.5-2.5 (3) MG/3ML SOLN, Inhale 3 mLs into the lungs every 6 (six) hours as needed (wheezing/sob). , Disp: , Rfl:  .  metFORMIN (GLUCOPHAGE-XR) 500 MG 24 hr tablet, Take 500 mg by mouth daily with breakfast., Disp: , Rfl:  .  multivitamin-lutein (OCUVITE-LUTEIN) CAPS capsule, Take 1 capsule by mouth daily., Disp: 30 capsule, Rfl: 0 .  nicotine (NICODERM CQ - DOSED IN MG/24 HOURS) 14 mg/24hr patch, Place 1 patch (14 mg total) onto the skin daily., Disp: 28 patch, Rfl: 0 .  nitroGLYCERIN (NITROSTAT) 0.4 MG SL tablet, Place 0.4 mg under the tongue every 5 (five) minutes as needed for chest pain., Disp: , Rfl:  .  nutrition supplement, JUVEN, (JUVEN) PACK, Take 1 packet by mouth 2 (two) times daily between meals., Disp: 30 packet, Rfl: 0 .  Oxycodone HCl 10 MG TABS, Take 1 po every 6 hours as needed for pain, Disp: , Rfl:  .  traZODone (DESYREL) 50 MG tablet, Take 1 tablet by mouth at bedtime., Disp: , Rfl:   ROS  Constitutional: Denies any fever or chills Gastrointestinal: No reported hemesis, hematochezia, vomiting, or acute GI distress Musculoskeletal: Denies any acute onset joint swelling, redness, loss of ROM, or weakness Neurological: No reported episodes of acute onset apraxia, aphasia, dysarthria, agnosia, amnesia, paralysis, loss of coordination, or loss of consciousness  Allergies  Mr. Breshears is allergic to dulaglutide and prednisone.  [place]  Drug: Mr.  Strader  reports that he does not use drugs. Alcohol:  reports that he does not drink alcohol. Tobacco:  reports that he has been smoking cigarettes. He has a 41.00 pack-year smoking history. He has never used smokeless tobacco. Medical:  has a past medical history of Arthritis, Asthma, Atherosclerosis, BPH (benign prostatic hyperplasia), Carotid arterial disease ([place]), Charcot's joint of foot, right, Chronic combined systolic (congestive) and diastolic (congestive) heart failure ([place]), COPD (chronic obstructive pulmonary disease) ([place]), Coronary artery disease, Diabetic neuropathy ([place]), Diabetic ulcer of right foot ([place]), Hernia, Hyperlipidemia, Hypertension, Ischemic cardiomyopathy, Morbid obesity ([place]), Neuropathy, PAD (peripheral artery disease) ([place]), Restless leg syndrome, Sleep apnea, Subclavian artery stenosis, right ([place]), Syncope and collapse, Tobacco use, Type II diabetes mellitus ([place]), and Varicose vein. Surgical: Mr. Seal  has a past surgical history that includes Appendectomy; Peripheral arterial stent graft; Clavicle surgery; Hernia repair; Cardiac catheterization (10/14); Cardiac catheterization (02/2010); Cardiac catheterization (02/21/2014); abdominal aortagram (N/A, 06/23/2013); Cardiac catheterization (N/A, 07/06/2015); Cardiac catheterization (07/06/2015); LEFT HEART CATH AND CORS/GRAFTS ANGIOGRAPHY (N/A, 04/28/2017); PERIPHERAL VASCULAR BALLOON ANGIOPLASTY (Right, 01/06/2018); and Irrigation and debridement foot (Right, 01/04/2018). Family: family history includes Heart attack in his father; Heart disease in his father; Hyperlipidemia in his father and mother; Hypertension in his father and mother.  Constitutional Exam  General appearance: Well nourished, well developed, and well hydrated. In no apparent acute distress There were no vitals filed for this visit. BMI Assessment: Estimated body mass index is 37.07 kg/m as calculated from the following:   Height as of 02/16/18: '5\' 9"'$  (1.753  m).   Weight as of 01/29/18: 251 lb (113.9 kg).  BMI interpretation table: BMI level Category Range association with higher incidence of chronic pain  <18 kg/m2 Underweight   18.5-24.9 kg/m2 Ideal body weight   25-29.9 kg/m2 Overweight Increased incidence by 20%  30-34.9 kg/m2 Obese (Class I) Increased incidence by 68%  35-39.9 kg/m2 Severe obesity (  Class II) Increased incidence by 136%  >40 kg/m2 Extreme obesity (Class III) Increased incidence by 254%   Patient's current BMI Ideal Body weight  There is no height or weight on file to calculate BMI. Patient weight not recorded   BMI Readings from Last 4 Encounters:  02/16/18 37.07 kg/m  01/29/18 39.31 kg/m  01/06/18 35.87 kg/m  12/23/17 36.57 kg/m   Wt Readings from Last 4 Encounters:  01/29/18 251 lb (113.9 kg)  01/06/18 229 lb (103.9 kg)  12/23/17 233 lb 7.5 oz (105.9 kg)  11/23/17 230 lb 4.8 oz (104.5 kg)  Psych/Mental status: Alert, oriented x 3 (person, place, & time)       Eyes: PERLA Respiratory: No evidence of acute respiratory distress  Cervical Spine Area Exam  Skin & Axial Inspection: No masses, redness, edema, swelling, or associated skin lesions Alignment: Symmetrical Functional ROM: Unrestricted ROM      Stability: No instability detected Muscle Tone/Strength: Functionally intact. No obvious neuro-muscular anomalies detected. Sensory (Neurological): Unimpaired Palpation: No palpable anomalies              Upper Extremity (UE) Exam    Side: Right upper extremity  Side: Left upper extremity  Skin & Extremity Inspection: Skin color, temperature, and hair growth are WNL. No peripheral edema or cyanosis. No masses, redness, swelling, asymmetry, or associated skin lesions. No contractures.  Skin & Extremity Inspection: Skin color, temperature, and hair growth are WNL. No peripheral edema or cyanosis. No masses, redness, swelling, asymmetry, or associated skin lesions. No contractures.  Functional ROM: Unrestricted  ROM          Functional ROM: Unrestricted ROM          Muscle Tone/Strength: Functionally intact. No obvious neuro-muscular anomalies detected.  Muscle Tone/Strength: Functionally intact. No obvious neuro-muscular anomalies detected.  Sensory (Neurological): Unimpaired          Sensory (Neurological): Unimpaired          Palpation: No palpable anomalies              Palpation: No palpable anomalies              Provocative Test(s):  Phalen's test: deferred Tinel's test: deferred Apley's scratch test (touch opposite shoulder):  Action 1 (Across chest): deferred Action 2 (Overhead): deferred Action 3 (LB reach): deferred   Provocative Test(s):  Phalen's test: deferred Tinel's test: deferred Apley's scratch test (touch opposite shoulder):  Action 1 (Across chest): deferred Action 2 (Overhead): deferred Action 3 (LB reach): deferred    Thoracic Spine Area Exam  Skin & Axial Inspection: No masses, redness, or swelling Alignment: Symmetrical Functional ROM: Unrestricted ROM Stability: No instability detected Muscle Tone/Strength: Functionally intact. No obvious neuro-muscular anomalies detected. Sensory (Neurological): Unimpaired Muscle strength & Tone: No palpable anomalies  Lumbar Spine Area Exam  Skin & Axial Inspection: No masses, redness, or swelling Alignment: Symmetrical Functional ROM: Unrestricted ROM       Stability: No instability detected Muscle Tone/Strength: Functionally intact. No obvious neuro-muscular anomalies detected. Sensory (Neurological): Unimpaired Palpation: No palpable anomalies       Provocative Tests: Hyperextension/rotation test: deferred today       Lumbar quadrant test (Kemp's test): deferred today       Lateral bending test: deferred today       [name]'s Maneuver: deferred today                   FABER test: deferred today  S-I anterior distraction/compression test: deferred today         S-I lateral compression test: deferred  today         S-I Thigh-thrust test: deferred today         S-I Gaenslen's test: deferred today          Gait & Posture Assessment  Ambulation: Unassisted Gait: Relatively normal for age and body habitus Posture: WNL   Lower Extremity Exam    Side: Right lower extremity  Side: Left lower extremity  Stability: No instability observed          Stability: No instability observed          Skin & Extremity Inspection: Skin color, temperature, and hair growth are WNL. No peripheral edema or cyanosis. No masses, redness, swelling, asymmetry, or associated skin lesions. No contractures.  Skin & Extremity Inspection: Skin color, temperature, and hair growth are WNL. No peripheral edema or cyanosis. No masses, redness, swelling, asymmetry, or associated skin lesions. No contractures.  Functional ROM: Unrestricted ROM                  Functional ROM: Unrestricted ROM                  Muscle Tone/Strength: Functionally intact. No obvious neuro-muscular anomalies detected.  Muscle Tone/Strength: Functionally intact. No obvious neuro-muscular anomalies detected.  Sensory (Neurological): Unimpaired  Sensory (Neurological): Unimpaired  Palpation: No palpable anomalies  Palpation: No palpable anomalies   Assessment & Plan  Primary Diagnosis & Pertinent Problem List: The primary encounter diagnosis was Chronic pain syndrome. Diagnoses of Chronic lower extremity pain (Primary Area of Pain) (Right), Chronic foot pain (Secondary Area of Pain) (Right), Chronic shoulder pain (Tertiary Area of Pain) (Right), Abnormal MRI, shoulder (Right), Abnormal MRI, cervical spine (2016), DDD (degenerative disc disease), cervical, Cervical foraminal stenosis (Bilateral), Cervical central spinal stenosis, Cervical facet hypertrophy, Arthralgia of right acromioclavicular joint, Osteoarthritis of  AC (acromioclavicular) joint (Right), Biceps tendinosis of shoulder (Right), Tendinopathy of rotator cuff (Right), and Vitamin D  deficiency were also pertinent to this visit.  Visit Diagnosis: 1. Chronic pain syndrome   2. Chronic lower extremity pain (Primary Area of Pain) (Right)   3. Chronic foot pain (Secondary Area of Pain) (Right)   4. Chronic shoulder pain (Tertiary Area of Pain) (Right)   5. Abnormal MRI, shoulder (Right)   6. Abnormal MRI, cervical spine (2016)   7. DDD (degenerative disc disease), cervical   8. Cervical foraminal stenosis (Bilateral)   9. Cervical central spinal stenosis   10. Cervical facet hypertrophy   11. Arthralgia of right acromioclavicular joint   12. Osteoarthritis of  AC (acromioclavicular) joint (Right)   13. Biceps tendinosis of shoulder (Right)   14. Tendinopathy of rotator cuff (Right)   15. Vitamin D deficiency    Problems updated and reviewed during this visit: Problem  Abnormal MRI, shoulder (Right)   IMPRESSION: Extensive rotator cuff tendinosis without definitive evidence of full-thickness tear. Possible small posterior SLAP lesion. Moderate to severe biceps tendinosis with longitudinal split tear. 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   Abnormal MRI, cervical spine (2016)   FINDINGS: Mild degenerative endplate changes. No aggressive osseous lesions. C3-4: Posterior protrusion. Mild facet hypertrophy. Mild narrowing. Moderate bilateral foraminal narrowing. C4-5: Uncovertebral joint and facet hypertrophy. Moderate spinal canal and severe bilateral foraminal narrowing. C5-6: Disc protrusion, uncovertebral joint and facet hypertrophy. Mild spinal canal narrowing. Moderate left and severe right bilateral foraminal narrowing.  Approximately 1.0  cm T2 hyperintense lesion in the parapharyngeal soft tissues that is incompletely characterized.    IMPRESSION:  1. Moderate degenerative disc disease of the cervical spine. This is most pronounced at C5-6 with mild spinal canal narrowing and moderate left and severe right bilateral foraminal narrowing. 2.  Incompletely characterized approximately 1 cm T2 hyperintense lesion in the parapharyngeal soft tissues, possibly lymph node.   Ddd (Degenerative Disc Disease), Cervical  Cervical foraminal stenosis (Bilateral)  Cervical central spinal stenosis  Cervical facet hypertrophy  Arthralgia of acromioclavicular joint (Right)  Osteoarthritis of  AC (acromioclavicular) joint (Right)  Biceps tendinosis of shoulder (Right)  Tendinopathy of rotator cuff (Right)  Chronic lower extremity pain (Primary Area of Pain) (Right)  Chronic shoulder pain (Tertiary Area of Pain) (Right)  Chronic foot pain (Secondary Area of Pain) (Right)  Foot ulcer (Right)  Diabetic Neuropathy (Hcc)  Vitamin D Deficiency  Disorder of Skeletal System    Plan of Care  Pharmacotherapy (Medications Ordered): No orders of the defined types were placed in this encounter.  Procedure Orders    No procedure(s) ordered today   Lab Orders  No laboratory test(s) ordered today   Imaging Orders  No imaging studies ordered today   Referral Orders  No referral(s) requested today    Pharmacological management options:  Opioid Analgesics: We'll take over management today. See above orders Membrane stabilizer: We have discussed the possibility of optimizing this mode of therapy, if tolerated Muscle relaxant: We have discussed the possibility of a trial NSAID: We have discussed the possibility of a trial Other analgesic(s): To be determined at a later time   Interventional management options: Planned, scheduled, and/or pending:    Diagnostic/therapeutic right-sided lumbar sympathetic blocks series under fluoroscopic guidance and IV sedation    Considering:   Diagnostic/therapeutic right-sided lumbar sympathetic block series  Possible right-sided lumbar sympathetic RFA  Diagnostic right suprascapular nerve block  Possible right suprascapular nerve RFA  Diagnostic right intra-articular shoulder injection    PRN Procedures:    None at this time   Provider-requested follow-up: No follow-ups on file.  Future Appointments  Date Time Provider [place]  02/23/2018 10:30 AM Milinda Pointer, MD [facility] None  03/02/2018 10:15 AM [facility] None  03/23/2018  3:45 PM [place] Callas, NP [facility]  04/21/2018  4:20 PM [place] Hampshire, MD [id number]    Primary Care Physician: Clarisse Gouge, MD Location: [facility] Note by: Gaspar Cola, MD Date: 02/23/2018; Time: 6:21 AM

## 2018-02-23 ENCOUNTER — Ambulatory Visit: Payer: Medicaid Other | Attending: Pain Medicine | Admitting: Pain Medicine

## 2018-02-23 DIAGNOSIS — M67813 Other specified disorders of tendon, right shoulder: Secondary | ICD-10-CM | POA: Insufficient documentation

## 2018-02-23 DIAGNOSIS — M5030 Other cervical disc degeneration, unspecified cervical region: Secondary | ICD-10-CM | POA: Insufficient documentation

## 2018-02-23 DIAGNOSIS — M47812 Spondylosis without myelopathy or radiculopathy, cervical region: Secondary | ICD-10-CM | POA: Insufficient documentation

## 2018-02-23 DIAGNOSIS — M67911 Unspecified disorder of synovium and tendon, right shoulder: Secondary | ICD-10-CM | POA: Insufficient documentation

## 2018-02-23 DIAGNOSIS — M25511 Pain in right shoulder: Secondary | ICD-10-CM | POA: Insufficient documentation

## 2018-02-23 DIAGNOSIS — R936 Abnormal findings on diagnostic imaging of limbs: Secondary | ICD-10-CM | POA: Insufficient documentation

## 2018-02-23 DIAGNOSIS — M19011 Primary osteoarthritis, right shoulder: Secondary | ICD-10-CM | POA: Insufficient documentation

## 2018-02-23 DIAGNOSIS — E559 Vitamin D deficiency, unspecified: Secondary | ICD-10-CM | POA: Insufficient documentation

## 2018-02-23 DIAGNOSIS — R937 Abnormal findings on diagnostic imaging of other parts of musculoskeletal system: Secondary | ICD-10-CM | POA: Insufficient documentation

## 2018-02-23 DIAGNOSIS — M4802 Spinal stenosis, cervical region: Secondary | ICD-10-CM | POA: Insufficient documentation

## 2018-02-27 ENCOUNTER — Other Ambulatory Visit (INDEPENDENT_AMBULATORY_CARE_PROVIDER_SITE_OTHER): Payer: Self-pay | Admitting: Vascular Surgery

## 2018-03-02 ENCOUNTER — Inpatient Hospital Stay: Admission: RE | Admit: 2018-03-02 | Payer: Medicaid Other | Source: Ambulatory Visit

## 2018-03-02 MED ORDER — CEFAZOLIN SODIUM-DEXTROSE 2-4 GM/100ML-% IV SOLN
2.0000 g | Freq: Once | INTRAVENOUS | Status: AC
  Administered 2018-03-03: 2 g via INTRAVENOUS

## 2018-03-03 ENCOUNTER — Ambulatory Visit
Admission: RE | Admit: 2018-03-03 | Discharge: 2018-03-03 | Disposition: A | Discharge status: Home / Self Care | Payer: Medicaid Other | Source: Ambulatory Visit | Attending: Vascular Surgery | Admitting: Vascular Surgery

## 2018-03-03 ENCOUNTER — Encounter
Admission: RE | Disposition: A | Discharge status: Home / Self Care | Payer: Self-pay | Source: Ambulatory Visit | Attending: Vascular Surgery

## 2018-03-03 DIAGNOSIS — Z9889 Other specified postprocedural states: Secondary | ICD-10-CM | POA: Diagnosis not present

## 2018-03-03 DIAGNOSIS — Z794 Long term (current) use of insulin: Secondary | ICD-10-CM | POA: Insufficient documentation

## 2018-03-03 DIAGNOSIS — F17210 Nicotine dependence, cigarettes, uncomplicated: Secondary | ICD-10-CM | POA: Insufficient documentation

## 2018-03-03 DIAGNOSIS — L97511 Non-pressure chronic ulcer of other part of right foot limited to breakdown of skin: Secondary | ICD-10-CM | POA: Diagnosis not present

## 2018-03-03 DIAGNOSIS — I5042 Chronic combined systolic (congestive) and diastolic (congestive) heart failure: Secondary | ICD-10-CM | POA: Diagnosis not present

## 2018-03-03 DIAGNOSIS — Z888 Allergy status to other drugs, medicaments and biological substances status: Secondary | ICD-10-CM | POA: Insufficient documentation

## 2018-03-03 DIAGNOSIS — E1151 Type 2 diabetes mellitus with diabetic peripheral angiopathy without gangrene: Secondary | ICD-10-CM | POA: Insufficient documentation

## 2018-03-03 DIAGNOSIS — E1142 Type 2 diabetes mellitus with diabetic polyneuropathy: Secondary | ICD-10-CM | POA: Insufficient documentation

## 2018-03-03 DIAGNOSIS — I70245 Atherosclerosis of native arteries of left leg with ulceration of other part of foot: Secondary | ICD-10-CM | POA: Insufficient documentation

## 2018-03-03 DIAGNOSIS — N186 End stage renal disease: Secondary | ICD-10-CM | POA: Diagnosis not present

## 2018-03-03 DIAGNOSIS — G4730 Sleep apnea, unspecified: Secondary | ICD-10-CM | POA: Insufficient documentation

## 2018-03-03 DIAGNOSIS — Z992 Dependence on renal dialysis: Secondary | ICD-10-CM | POA: Diagnosis not present

## 2018-03-03 DIAGNOSIS — G2581 Restless legs syndrome: Secondary | ICD-10-CM | POA: Insufficient documentation

## 2018-03-03 DIAGNOSIS — Z6837 Body mass index (BMI) 37.0-37.9, adult: Secondary | ICD-10-CM | POA: Insufficient documentation

## 2018-03-03 DIAGNOSIS — I255 Ischemic cardiomyopathy: Secondary | ICD-10-CM | POA: Diagnosis not present

## 2018-03-03 DIAGNOSIS — L97521 Non-pressure chronic ulcer of other part of left foot limited to breakdown of skin: Secondary | ICD-10-CM | POA: Insufficient documentation

## 2018-03-03 DIAGNOSIS — L97519 Non-pressure chronic ulcer of other part of right foot with unspecified severity: Secondary | ICD-10-CM | POA: Diagnosis not present

## 2018-03-03 DIAGNOSIS — E785 Hyperlipidemia, unspecified: Secondary | ICD-10-CM | POA: Insufficient documentation

## 2018-03-03 DIAGNOSIS — Z955 Presence of coronary angioplasty implant and graft: Secondary | ICD-10-CM | POA: Diagnosis not present

## 2018-03-03 DIAGNOSIS — Z8249 Family history of ischemic heart disease and other diseases of the circulatory system: Secondary | ICD-10-CM | POA: Insufficient documentation

## 2018-03-03 DIAGNOSIS — I70202 Unspecified atherosclerosis of native arteries of extremities, left leg: Secondary | ICD-10-CM

## 2018-03-03 DIAGNOSIS — E1122 Type 2 diabetes mellitus with diabetic chronic kidney disease: Secondary | ICD-10-CM | POA: Insufficient documentation

## 2018-03-03 DIAGNOSIS — J449 Chronic obstructive pulmonary disease, unspecified: Secondary | ICD-10-CM | POA: Insufficient documentation

## 2018-03-03 DIAGNOSIS — Z9862 Peripheral vascular angioplasty status: Secondary | ICD-10-CM | POA: Diagnosis not present

## 2018-03-03 DIAGNOSIS — E11621 Type 2 diabetes mellitus with foot ulcer: Secondary | ICD-10-CM | POA: Insufficient documentation

## 2018-03-03 DIAGNOSIS — I70235 Atherosclerosis of native arteries of right leg with ulceration of other part of foot: Secondary | ICD-10-CM | POA: Diagnosis not present

## 2018-03-03 DIAGNOSIS — I132 Hypertensive heart and chronic kidney disease with heart failure and with stage 5 chronic kidney disease, or end stage renal disease: Secondary | ICD-10-CM | POA: Insufficient documentation

## 2018-03-03 DIAGNOSIS — I70219 Atherosclerosis of native arteries of extremities with intermittent claudication, unspecified extremity: Secondary | ICD-10-CM

## 2018-03-03 DIAGNOSIS — I7092 Chronic total occlusion of artery of the extremities: Secondary | ICD-10-CM | POA: Diagnosis not present

## 2018-03-03 HISTORY — PX: LOWER EXTREMITY ANGIOGRAPHY: CATH118251

## 2018-03-03 LAB — BUN: BUN: 13 mg/dL (ref 6–20)

## 2018-03-03 LAB — CREATININE, SERUM
Creatinine, Ser: 0.9 mg/dL (ref 0.61–1.24)
GFR calc Af Amer: >60 mL/min (ref >60)
GFR calc non Af Amer: >60 mL/min (ref >60)

## 2018-03-03 LAB — GLUCOSE, CAPILLARY
Glucose-Capillary: 222 mg/dL — ABNORMAL HIGH (ref 70–99)
Glucose-Capillary: 192 mg/dL — ABNORMAL HIGH (ref 70–99)

## 2018-03-03 SURGERY — LOWER EXTREMITY ANGIOGRAPHY
Anesthesia: Moderate Sedation | Laterality: Right

## 2018-03-03 MED ORDER — ACETAMINOPHEN 325 MG PO TABS: 650.0000 mg | ORAL_TABLET | ORAL | Status: DC | PRN

## 2018-03-03 MED ORDER — SODIUM CHLORIDE 0.9% FLUSH: 3.0000 mL | INTRAVENOUS | Status: DC | PRN

## 2018-03-03 MED ORDER — MORPHINE SULFATE (PF) 4 MG/ML IV SOLN: 2.0000 mg | INTRAVENOUS | Status: DC | PRN

## 2018-03-03 MED ORDER — METHYLPREDNISOLONE SODIUM SUCC 125 MG IJ SOLR: 125.0000 mg | INTRAMUSCULAR | Status: DC | PRN

## 2018-03-03 MED ORDER — IPRATROPIUM-ALBUTEROL 0.5-2.5 (3) MG/3ML IN SOLN
RESPIRATORY_TRACT | Status: AC
  Filled 2018-03-03: qty 3

## 2018-03-03 MED ORDER — SODIUM CHLORIDE 0.9 % IV SOLN: 250.0000 mL | INTRAVENOUS | Status: DC | PRN

## 2018-03-03 MED ORDER — HYDROMORPHONE HCL 1 MG/ML IJ SOLN: 1.0000 mg | Freq: Once | INTRAMUSCULAR | Status: DC | PRN

## 2018-03-03 MED ORDER — FENTANYL CITRATE (PF) 100 MCG/2ML IJ SOLN
INTRAMUSCULAR | Status: DC | PRN
  Administered 2018-03-03: 25 ug via INTRAVENOUS
  Administered 2018-03-03: 12.5 ug via INTRAVENOUS
  Administered 2018-03-03: 25 ug via INTRAVENOUS
  Administered 2018-03-03: 50 ug via INTRAVENOUS
  Administered 2018-03-03: 12.5 ug via INTRAVENOUS
  Administered 2018-03-03: 25 ug via INTRAVENOUS

## 2018-03-03 MED ORDER — HEPARIN SODIUM (PORCINE) 1000 UNIT/ML IJ SOLN
INTRAMUSCULAR | Status: AC
  Filled 2018-03-03: qty 1

## 2018-03-03 MED ORDER — ONDANSETRON HCL 4 MG/2ML IJ SOLN: 4.0000 mg | Freq: Four times a day (QID) | INTRAMUSCULAR | Status: DC | PRN

## 2018-03-03 MED ORDER — CEFAZOLIN SODIUM-DEXTROSE 2-4 GM/100ML-% IV SOLN
INTRAVENOUS | Status: AC
  Filled 2018-03-03: qty 100

## 2018-03-03 MED ORDER — HEPARIN SODIUM (PORCINE) 1000 UNIT/ML IJ SOLN
INTRAMUSCULAR | Status: DC | PRN
  Administered 2018-03-03: 4000 [IU] via INTRAVENOUS

## 2018-03-03 MED ORDER — HEPARIN (PORCINE) IN NACL 1000-0.9 UT/500ML-% IV SOLN
INTRAVENOUS | Status: AC
  Filled 2018-03-03: qty 1000

## 2018-03-03 MED ORDER — SODIUM CHLORIDE 0.9% FLUSH: 3.0000 mL | Freq: Two times a day (BID) | INTRAVENOUS | Status: DC

## 2018-03-03 MED ORDER — HYDRALAZINE HCL 20 MG/ML IJ SOLN: 5.0000 mg | INTRAMUSCULAR | Status: DC | PRN

## 2018-03-03 MED ORDER — FENTANYL CITRATE (PF) 100 MCG/2ML IJ SOLN
INTRAMUSCULAR | Status: AC
  Filled 2018-03-03: qty 4

## 2018-03-03 MED ORDER — MIDAZOLAM HCL 2 MG/2ML IJ SOLN
INTRAMUSCULAR | Status: DC | PRN
Start: 2018-03-03 — End: 2018-03-03
  Administered 2018-03-03: 2 mg via INTRAVENOUS
  Administered 2018-03-03: 1 mg via INTRAVENOUS
  Administered 2018-03-03 (×2): 0.5 mg via INTRAVENOUS
  Administered 2018-03-03: 1 mg via INTRAVENOUS

## 2018-03-03 MED ORDER — SODIUM CHLORIDE 0.9 % IV SOLN: INTRAVENOUS | Status: DC

## 2018-03-03 MED ORDER — SODIUM CHLORIDE 0.9 % IV SOLN
INTRAVENOUS | Status: DC
  Administered 2018-03-03: 1000 mL via INTRAVENOUS

## 2018-03-03 MED ORDER — LIDOCAINE HCL (PF) 1 % IJ SOLN
INTRAMUSCULAR | Status: AC
  Filled 2018-03-03: qty 30

## 2018-03-03 MED ORDER — IOPAMIDOL (ISOVUE-300) INJECTION 61%
INTRAVENOUS | Status: DC | PRN
  Administered 2018-03-03: 80 mL via INTRA_ARTERIAL

## 2018-03-03 MED ORDER — FAMOTIDINE 20 MG PO TABS: 40.0000 mg | ORAL_TABLET | ORAL | Status: DC | PRN

## 2018-03-03 MED ORDER — LABETALOL HCL 5 MG/ML IV SOLN: 10.0000 mg | INTRAVENOUS | Status: DC | PRN

## 2018-03-03 MED ORDER — MIDAZOLAM HCL 5 MG/5ML IJ SOLN
INTRAMUSCULAR | Status: AC
  Filled 2018-03-03: qty 10

## 2018-03-03 MED ORDER — IPRATROPIUM-ALBUTEROL 0.5-2.5 (3) MG/3ML IN SOLN
3.0000 mL | Freq: Four times a day (QID) | RESPIRATORY_TRACT | Status: DC
  Administered 2018-03-03: 3 mL via RESPIRATORY_TRACT

## 2018-03-03 MED ORDER — OXYCODONE HCL 5 MG PO TABS: 5.0000 mg | ORAL_TABLET | ORAL | Status: DC | PRN

## 2018-03-03 SURGICAL SUPPLY — 27 items
BALLN LUTONIX AV 7X60X75 (BALLOONS) ×2
BALLN LUTONIX DCB 7X60X130 (BALLOONS) ×2
BALLN ULTRASCORE 014 3X200X150 (BALLOONS) ×2
BALLN ULTRVRSE 5X40X130C (BALLOONS) ×2
BALLOON LUTONIX AV 7X60X75 (BALLOONS) ×1 IMPLANT
BALLOON LUTONIX DCB 7X60X130 (BALLOONS) ×1 IMPLANT
BALLOON ULTRSCRE 014 3X200X150 (BALLOONS) ×1 IMPLANT
BALLOON ULTRVRSE 5X40X130C (BALLOONS) ×1 IMPLANT
CATH PIG 70CM (CATHETERS) ×2 IMPLANT
CATH SEEKER .035X135CM (CATHETERS) ×2 IMPLANT
CATH VERT 5FR 125CM (CATHETERS) ×2 IMPLANT
DEVICE PRESTO INFLATION (MISCELLANEOUS) ×2 IMPLANT
DEVICE STARCLOSE SE CLOSURE (Vascular Products) ×2 IMPLANT
DEVICE TORQUE .025-.038 (MISCELLANEOUS) ×2 IMPLANT
NEEDLE ENTRY 21GA 7CM ECHOTIP (NEEDLE) ×2 IMPLANT
PACK ANGIOGRAPHY (CUSTOM PROCEDURE TRAY) ×2 IMPLANT
SET INTRO CAPELLA COAXIAL (SET/KITS/TRAYS/PACK) ×2 IMPLANT
SHEATH ANL2 6FRX45 HC (SHEATH) ×2 IMPLANT
SHEATH BRITE TIP 5FRX11 (SHEATH) ×2 IMPLANT
SHIELD X-DRAPE GOLD 12X17 (MISCELLANEOUS) ×2 IMPLANT
STENT VIABAHN 6X100X120 (Permanent Stent) ×2 IMPLANT
SYR MEDRAD MARK V 150ML (SYRINGE) ×2 IMPLANT
TUBING CONTRAST HIGH PRESS 72 (TUBING) ×2 IMPLANT
WIRE AQUATRACK .035X260CM (WIRE) ×2 IMPLANT
WIRE G V18X300CM (WIRE) ×2 IMPLANT
WIRE J 3MM .035X145CM (WIRE) ×2 IMPLANT
WIRE SPARTACORE .014X300CM (WIRE) ×2 IMPLANT

## 2018-03-03 NOTE — Op Note (Signed)
[facility] Percutaneous Study/Intervention Procedural Note   Date of Surgery: 03/03/2018  Surgeon:  Katha Cabal, MD.  Pre-operative Diagnosis: Atherosclerotic occlusive disease bilateral lower extremities with ulceration of the right foot  Post-operative diagnosis: Same  Procedure(s) Performed: 1. Introduction catheter into right lower extremity 3rd order catheter placement  2. Contrast injection right lower extremity for distal runoff   3. Percutaneous transluminal angioplasty and stent placement right superficial femoral artery  4. Percutaneous transluminal angioplasty right anterior tibial artery             5.  Percutaneous transluminal angioplasty left external iliac artery             6.  Star close closure left common femoral arteriotomy  Anesthesia: Conscious sedation was administered under my direct supervision by the interventional radiology RN. IV Versed plus fentanyl were utilized. Continuous ECG, pulse oximetry and blood pressure was monitored throughout the entire procedure.  Conscious sedation was for a total of 103 minutes.  Sheath: 6 [place] Ansell left common femoral artery  Contrast: 80 cc  Fluoroscopy Time: 11.5 minutes  Indications: [name] [name] presents with nonhealing right foot wound.  He is status post successful revascularization of his popliteal but when he presented for follow-up his noninvasive studies did not show a significant improvement in his wound remains bland and not improving.  The risks and benefits for follow-up angiography and further intervention are reviewed all questions answered patient agrees to proceed with angiography and intervention for limb salvage.  Procedure: [name] [name] is a 57 y.o. y.o. male who was identified and appropriate procedural time out was performed. The patient was then placed supine on the table and prepped and draped in the  usual sterile fashion.   Ultrasound was placed in the sterile sleeve and the left groin was evaluated the left common femoral artery was echolucent and pulsatile indicating patency.  Image was recorded for the permanent record and under real-time visualization a microneedle was inserted into the common femoral artery microwire followed by a micro-sheath.  A J-wire was then advanced through the micro-sheath and a  5 [place] sheath was then inserted over a J-wire. J-wire was then advanced and a 5 French pigtail catheter was positioned at the level of T12. AP projection of the aorta was then obtained. Pigtail catheter was repositioned to above the bifurcation and a LAO view of the pelvis was obtained.  Subsequently a pigtail catheter with the aqua track was used to cross the aortic bifurcation the catheter wire were advanced down into the right distal external iliac artery. Oblique view of the femoral bifurcation was then obtained and subsequently the wire was reintroduced and the pigtail catheter negotiated into the SFA representing third order catheter placement. Distal runoff was then performed.  Wire and vertebral catheter were then negotiated down to the distal popliteal and again hand-injection of contrast was used to create distal runoff.  5000 units of heparin was then given and allowed to circulate and a 6 [place] Ansell sheath was advanced up and over the bifurcation and positioned in the femoral artery  Vertebral catheter and aqua track wire were then negotiated down into the distal popliteal.  Distal runoff was then completed by hand injection through the catheter as noted above. The wire and catheter were then negotiated into the anterior tibial.  The vertebral catheter was exchanged for a seeker catheter and the long segment occlusion of the anterior tibial was crossed.  Hand-injection contrast through  the seeker catheter distally demonstrated intraluminal positioning and verified distal runoff.  A  0.014 [place] core wire was then reintroduced and a 3 mm x 20 cm ultra score balloon was used to angioplasty the anterior tibial artery. Inflations were to 12 atmospheres for 1 minute.  Follow-up imaging demonstrated patency of the entire anterior tibial with less than 15% residual stenosis and in-line flow filling the dorsalis pedis.  Attention was then turned to the lesion noted in the proximal one third of the SFA.  This focal 85 to 90% narrowing which possibly represented a mobile thrombus or clot was better characterized by hand-injection with magnified imaging.  After appropriate measurements a 6 mm x 100 mm via bond stent was deployed across this lesion it was postdilated with a 5 mm balloon inflations were to 8 to 10 atm.  Follow-up imaging demonstrated wide patency through this region with elimination of this focal stenosis and preservation of distal runoff.  Attention was then turned to the left external iliac and the sheath was pulled back into the distal iliac on the left side.  Hand-injection contrast in a steep RAO projection was obtained.  65 to 70% narrowing in 2 locations of the left external iliac were identified.  These were treated with 2 separate balloon inflations both using a 7 mm x 60 mm Lutonix drug-eluting balloon.  Both inflations were to 12 atm for 2 full minutes.  Follow-up injection demonstrated less than 20% residual stenosis no evidence of dissection.  After review of these images the sheath is pulled into the most distal left external iliac and an oblique  of the common femoral is obtained and a Star close device deployed. There no immediate complications.   Findings: The abdominal aorta is opacified with a bolus injection contrast. Renal arteries are single and widely patent. The aorta itself has diffuse disease but no hemodynamically significant lesions. The bilateral common and right external iliac arteries are widely patent.  On the initial LAO projection the left  external iliac demonstrates greater than 80% stenosis.  Later in the case under magnified imaging in RAO projection it appears to be on the order of 70% but still hemodynamically significant.  The right common femoral is patent as is the profunda femoris but there is extensive disease bordering on hemodynamically significant primarily located within the common femoral itself.  The SFA does indeed have a significant stenosis of 85 to 90% focally in the proximal one third.  It is possible based on the image that this is mobile or soft thrombus.  It does not appear to be associated with any stent deformation in this area.  It is clearly hemodynamically significant.  The distal SFA and the entire length of the popliteal demonstrates diffuse disease but not hemodynamically significant stenosis and the trifurcation is patent with occlusion of the anterior tibial just past its origin through its proximal half distally it reconstitutes and fills the pedal arch.  The tibioperoneal trunk posterior tibial and peroneal demonstrates mild disease and are patent to the foot  Following angioplasty anterior tibial now is in-line flow and looks quite nice with less than 15% residual stenosis throughout its length. Angioplasty and stent placement of the SFA yields an excellent result with less than 15% residual stenosis.  Angioplasty of the left external iliac artery demonstrates an excellent result no evidence of dissection less than 20% residual stenosis    Summary: Successful recanalization right lower extremity for limb salvage  Disposition: Patient was taken to the  recovery room in stable condition having tolerated the procedure well.  [name] [name], [name] [name] 03/03/2018,10:35 AM

## 2018-03-03 NOTE — H&P (Signed)
[facility] History & Physical Update  The patient was interviewed and re-examined.  The patient's previous History and Physical has been reviewed and is unchanged.  There is no change in the plan of care. We plan to proceed with the scheduled procedure.  Hortencia Pilar, MD  03/03/2018, 8:41 AM

## 2018-03-03 NOTE — Progress Notes (Signed)
Pt arrived with wound vac attached to right foot wound. Turned off for procedure during Pre Procedure. Pt Placed back on wound vac at 1130, no changes made to settings.

## 2018-03-23 ENCOUNTER — Ambulatory Visit: Payer: Medicaid Other | Admitting: Infectious Diseases

## 2018-03-26 ENCOUNTER — Encounter (INDEPENDENT_AMBULATORY_CARE_PROVIDER_SITE_OTHER): Payer: Self-pay | Admitting: Vascular Surgery

## 2018-03-26 ENCOUNTER — Ambulatory Visit (INDEPENDENT_AMBULATORY_CARE_PROVIDER_SITE_OTHER): Payer: Medicaid Other | Admitting: Vascular Surgery

## 2018-03-26 VITALS — BP 128/81 | HR 122 | Resp 18 | Ht 67.0 in | Wt 218.0 lb

## 2018-03-26 DIAGNOSIS — L97419 Non-pressure chronic ulcer of right heel and midfoot with unspecified severity: Secondary | ICD-10-CM

## 2018-03-26 DIAGNOSIS — Z794 Long term (current) use of insulin: Secondary | ICD-10-CM

## 2018-03-26 DIAGNOSIS — E118 Type 2 diabetes mellitus with unspecified complications: Secondary | ICD-10-CM

## 2018-03-26 DIAGNOSIS — I25118 Atherosclerotic heart disease of native coronary artery with other forms of angina pectoris: Secondary | ICD-10-CM | POA: Diagnosis not present

## 2018-03-26 DIAGNOSIS — I10 Essential (primary) hypertension: Secondary | ICD-10-CM

## 2018-03-26 DIAGNOSIS — E782 Mixed hyperlipidemia: Secondary | ICD-10-CM

## 2018-03-26 DIAGNOSIS — J449 Chronic obstructive pulmonary disease, unspecified: Secondary | ICD-10-CM

## 2018-03-26 DIAGNOSIS — F17210 Nicotine dependence, cigarettes, uncomplicated: Secondary | ICD-10-CM

## 2018-03-26 DIAGNOSIS — I7025 Atherosclerosis of native arteries of other extremities with ulceration: Secondary | ICD-10-CM | POA: Diagnosis not present

## 2018-03-27 ENCOUNTER — Other Ambulatory Visit (INDEPENDENT_AMBULATORY_CARE_PROVIDER_SITE_OTHER): Payer: Self-pay | Admitting: Vascular Surgery

## 2018-03-27 ENCOUNTER — Encounter (INDEPENDENT_AMBULATORY_CARE_PROVIDER_SITE_OTHER): Payer: Self-pay

## 2018-03-29 ENCOUNTER — Encounter (INDEPENDENT_AMBULATORY_CARE_PROVIDER_SITE_OTHER): Payer: Self-pay | Admitting: Vascular Surgery

## 2018-03-29 NOTE — Progress Notes (Signed)
MRN : [id number]  [name] [name] is a 57 y.o. (Nov 30, 1960) male who presents with chief complaint of  Chief Complaint  Patient presents with  . Follow-up    ref Vickki Muff for evaluation for bka  .  History of Present Illness:   The patient is seen for evaluation of painful lower extremities and diminished pulses associated with ulceration of the foot.  The patient notes the ulcer has been present for multiple weeks and has not been improving.  It is very painful and has had some drainage.  No specific history of trauma noted by the patient.  The patient denies fever or chills.  the patient does have diabetes which has been difficult to control.  He has been following with Dr Vickki Muff and at this point the consensus is that the ulcer is not healing that the damage is too extensive and that amputation is indicated  The patient denies rest pain or dangling of an extremity off the side of the bed during the night for relief. No prior interventions or surgeries.  No history of back problems or DJD of the lumbar sacral spine.   The patient denies amaurosis fugax or recent TIA symptoms. There are no recent neurological changes noted. The patient denies history of DVT, PE or superficial thrombophlebitis. The patient denies recent episodes of angina or shortness of breath.   Current Meds  Medication Sig  . albuterol (PROVENTIL HFA;VENTOLIN HFA) 108 (90 BASE) MCG/ACT inhaler Inhale 2 puffs into the lungs every 6 (six) hours as needed for wheezing or shortness of breath.  [name] aspirin EC 81 MG tablet Take 1 tablet (81 mg total) by mouth daily.  [name] atorvastatin (LIPITOR) 80 MG tablet Take 80 mg by mouth daily.  . calcium carbonate (TUMS - DOSED IN MG ELEMENTAL CALCIUM) 500 MG chewable tablet Chew 4 tablets by mouth daily as needed for indigestion or heartburn.  . clopidogrel (PLAVIX) 75 MG tablet Take 75 mg by mouth daily.   [name] ezetimibe (ZETIA) 10 MG tablet Take 10 mg by mouth daily.  . furosemide  (LASIX) 40 MG tablet Take 40 mg by mouth 2 (two) times daily.   [name] gabapentin (NEURONTIN) 800 MG tablet Take 800 mg by mouth 4 (four) times daily.   [name] ipratropium-albuterol (DUONEB) 0.5-2.5 (3) MG/3ML SOLN Inhale 3 mLs into the lungs every 6 (six) hours as needed (wheezing/sob).   . metFORMIN (GLUCOPHAGE-XR) 500 MG 24 hr tablet Take 500 mg by mouth daily with breakfast.  . multivitamin-lutein (OCUVITE-LUTEIN) CAPS capsule Take 1 capsule by mouth daily.  . naproxen sodium (ALEVE) 220 MG tablet Take 440 mg by mouth daily as needed (pain).  . nitroGLYCERIN (NITRODUR - DOSED IN MG/24 HR) 0.4 mg/hr patch Place 0.4 mg onto the skin daily.  . nitroGLYCERIN (NITROSTAT) 0.4 MG SL tablet Place 0.4 mg under the tongue every 5 (five) minutes as needed for chest pain.  . Oxycodone HCl 10 MG TABS Take 10 mg by mouth every 6 (six) hours as needed (pain).   . potassium chloride (K-DUR,KLOR-CON) 10 MEQ tablet Take 10 mEq by mouth daily.  . ranitidine (ZANTAC) 150 MG tablet Take 300 mg by mouth daily as needed for heartburn.  . spironolactone (ALDACTONE) 25 MG tablet Take 25 mg by mouth daily.  . traZODone (DESYREL) 50 MG tablet Take 50 mg by mouth at bedtime as needed for sleep.     Past Medical History:  Diagnosis Date  . Arthritis   . Asthma   .  Atherosclerosis   . BPH (benign prostatic hyperplasia)   . Carotid arterial disease ([place])   . Charcot's joint of foot, right   . Chronic combined systolic (congestive) and diastolic (congestive) heart failure ([place])    a. 02/2013 EF 50% by LV gram; b. 04/2017 Echo: EF 25-30%. diff HK. Gr2 DD. Mod dil LA/RV. PASP 43mmHg.  [name] COPD (chronic obstructive pulmonary disease) ([place])   . Coronary artery disease    a. 2013 S/P PCI of LAD [facility]);  b. 03/2013 PCI: RCA 90p ( 2.5 x 23 mm DES); c. 02/2014 Cath: patent RCA stent->Med Rx; d. 04/2017 Cath: LM nl, LAd 30ost/p, 21m/d, D1 80ost, LCX 90p/m, OM1 85, RCA 70/20p, 64m, 70d->Referred for CT Surg-felt to be poor candidate.  .  Diabetic neuropathy ([place])   . Diabetic ulcer of right foot ([place])   . Hernia   . Hyperlipidemia   . Hypertension   . Ischemic cardiomyopathy    a. 04/2017 Echo: EF 25-30%; b. 08/2017 Cardiac MRI ([facility]): EF 19%, sev glob HK. RVEF 20%, mod BAE, triv MR, mild-mod TR. Basal lateral subendocardial infarct (viable), inf/infsept, dist septal ischemia, basal to mid lat peri-infarct ischemia.  . Morbid obesity ([place])   . Neuropathy   . PAD (peripheral artery disease) ([place])    a. Followed by Dr. Lucky Cowboy; b. 08/2010 Periph Angio: RSFA 70-80p (6X60 self-expanding stent); c. 09/2010 Periph Angio: L SFA 100p (7x unknown length self-expanding stent); d. 06/2015 Periph Angio: R SFA short segment occlusion (6x12 self-expanding stent).  . Restless leg syndrome   . Sleep apnea   . Subclavian artery stenosis, right ([place])   . Syncope and collapse   . Tobacco use    a. 75+ yr hx - still smoking 1ppd, down from 2 ppd.  . Type II diabetes mellitus ([place])   . Varicose vein     Past Surgical History:  Procedure Laterality Date  . ABDOMINAL AORTAGRAM N/A 06/23/2013   Procedure: ABDOMINAL Maxcine Ham;  Surgeon: Wellington Hampshire, MD;  Location: [place] CATH LAB;  Service: Cardiovascular;  Laterality: N/A;  . APPENDECTOMY    . CARDIAC CATHETERIZATION  10/14   [facility]; X1 STENT PROXIMAL RCA  . CARDIAC CATHETERIZATION  02/2010   [facility]  . CARDIAC CATHETERIZATION  02/21/2014   [facility]  . CLAVICLE SURGERY    . HERNIA REPAIR    . IRRIGATION AND DEBRIDEMENT FOOT Right 01/04/2018   Procedure: IRRIGATION AND DEBRIDEMENT FOOT;  Surgeon: Samara Deist, DPM;  Location: [facility] ORS;  Service: Podiatry;  Laterality: Right;  . LEFT HEART CATH AND CORS/GRAFTS ANGIOGRAPHY N/A 04/28/2017   Procedure: LEFT HEART CATH AND CORONARY ANGIOGRAPHY;  Surgeon: Wellington Hampshire, MD;  Location: [place] CV LAB;  Service: Cardiovascular;  Laterality: N/A;  . LOWER EXTREMITY ANGIOGRAPHY Right 03/03/2018   Procedure: LOWER EXTREMITY ANGIOGRAPHY;  Surgeon: Katha Cabal, MD;  Location: [place] CV LAB;  Service: Cardiovascular;  Laterality: Right;  . PERIPHERAL ARTERIAL STENT GRAFT     x2 left/right  . PERIPHERAL VASCULAR BALLOON ANGIOPLASTY Right 01/06/2018   Procedure: PERIPHERAL VASCULAR BALLOON ANGIOPLASTY;  Surgeon: Katha Cabal, MD;  Location: [place] CV LAB;  Service: Cardiovascular;  Laterality: Right;  . PERIPHERAL VASCULAR CATHETERIZATION N/A 07/06/2015   Procedure: Abdominal Aortogram w/Lower Extremity;  Surgeon: Algernon Huxley, MD;  Location: [place] CV LAB;  Service: Cardiovascular;  Laterality: N/A;  . PERIPHERAL VASCULAR CATHETERIZATION  07/06/2015   Procedure: Lower Extremity Intervention;  Surgeon: Algernon Huxley, MD;  Location: [facility]  INVASIVE CV LAB;  Service: Cardiovascular;;    Social History Social History   Tobacco Use  . Smoking status: Current Every Day Smoker    Packs/day: 1.00    Years: 41.00    Pack years: 41.00    Types: Cigarettes  . Smokeless tobacco: Never Used  Substance Use Topics  . Alcohol use: No  . Drug use: No    Family History Family History  Problem Relation Age of Onset  . Heart attack Father   . Heart disease Father   . Hypertension Father   . Hyperlipidemia Father   . Hypertension Mother   . Hyperlipidemia Mother     Allergies  Allergen Reactions  . Dulaglutide Anaphylaxis, Diarrhea and Hives  . Prednisone Rash     REVIEW OF SYSTEMS (Negative unless checked)  Constitutional: [] Weight loss  [] Fever  [] Chills Cardiac: [] Chest pain   [] Chest pressure   [] Palpitations   [] Shortness of breath when laying flat   [] Shortness of breath with exertion. Vascular:  [x] Pain in legs with walking   [x] Pain in legs at rest  [] History of DVT   [] Phlebitis   [x] Swelling in legs   [] Varicose veins   [x] Non-healing ulcers Pulmonary:   [] Uses home oxygen   [] Productive cough   [] Hemoptysis   [] Wheeze  [] COPD   [] Asthma Neurologic:  [] Dizziness   [] Seizures   [] History of stroke    [] History of TIA  [] Aphasia   [] Vissual changes   [] Weakness or numbness in arm   [] Weakness or numbness in leg Musculoskeletal:   [] Joint swelling   [] Joint pain   [] Low back pain Hematologic:  [] Easy bruising  [] Easy bleeding   [] Hypercoagulable state   [] Anemic Gastrointestinal:  [] Diarrhea   [] Vomiting  [] Gastroesophageal reflux/heartburn   [] Difficulty swallowing. Genitourinary:  [] Chronic kidney disease   [] Difficult urination  [] Frequent urination   [] Blood in urine Skin:  [] Rashes   [] Ulcers  Psychological:  [] History of anxiety   []  History of major depression.  Physical Examination  Vitals:   03/26/18 1427  BP: 128/81  Pulse: (!) 122  Resp: 18  Weight: 218 lb (98.9 kg)  Height: 5\' 7"  (1.702 m)   Body mass index is 34.14 kg/m. Gen: WD/WN, NAD Head: [place]/AT, No temporalis wasting.  Ear/Nose/Throat: Hearing grossly intact, nares w/o erythema or drainage Eyes: PER, EOMI, sclera nonicteric.  Neck: Supple, no large masses.   Pulmonary:  Good air movement, no audible wheezing bilaterally, no use of accessory muscles.  Cardiac: RRR, no JVD Vascular:  large ulcer of the heel right side Vessel Right Left  Radial Palpable Palpable  PT Not Palpable Palpable  DP Not Palpable Palpable  Gastrointestinal: Non-distended. No guarding/no peritoneal signs.  Musculoskeletal: M/S 5/5 throughout.  No deformity or atrophy.  Neurologic: CN 2-12 intact. Symmetrical.  Speech is fluent. Motor exam as listed above. Psychiatric: Judgment intact, Mood & affect appropriate for pt's clinical situation. Dermatologic: No rashes + ulcers noted.  No changes consistent with cellulitis. Lymph : No lichenification or skin changes of chronic lymphedema.  CBC Lab Results  Component Value Date   WBC 8.3 02/05/2018   HGB 10.6 (L) 02/05/2018   HCT 33.5 (L) 02/05/2018   MCV 81.5 02/05/2018   PLT 303 02/05/2018    BMET    Component Value Date/Time   NA 131 (L) 02/05/2018 1417   NA 132 (L) 01/29/2018  1602   NA 136 06/08/2014 1037   K 4.6 02/05/2018 1417   K 4.3 06/08/2014 1037  CL 90 (L) 02/05/2018 1417   CL 101 06/08/2014 1037   CO2 31 02/05/2018 1417   CO2 27 06/08/2014 1037   GLUCOSE 190 (H) 02/05/2018 1417   GLUCOSE 415 (H) 06/08/2014 1037   BUN 13 03/03/2018 0821   BUN 9 01/29/2018 1602   BUN 9 06/08/2014 1037   CREATININE 0.90 03/03/2018 0821   CREATININE 0.87 02/05/2018 1417   CALCIUM 8.4 (L) 02/05/2018 1417   CALCIUM 9.2 06/08/2014 1037   GFRNONAA >60 03/03/2018 0821   GFRNONAA >60 06/08/2014 1037   GFRNONAA >60 02/17/2014 0909   GFRAA >60 03/03/2018 0821   GFRAA >60 06/08/2014 1037   GFRAA >60 02/17/2014 0909   CrCl cannot be calculated (Patient's most recent lab result is older than the maximum 21 days allowed.).  COAG Lab Results  Component Value Date   INR 1.21 01/02/2018   INR 1.06 12/22/2017   INR 1.06 11/21/2017    Radiology No results found.  Assessment/Plan 1. Atherosclerosis of native arteries of the extremities with ulceration ([place])  Recommend:  The patient has evidence of severe atherosclerotic changes of both lower extremities associated with ulceration and tissue loss of the foot.  At this point the ulcer in non-reconstructable.  BKA is indicated.     Patient should undergo right lower extremity BKA.  The risks and benefits as well as the alternative therapies was discussed in detail with the patient.  All questions were answered.  Patient agrees to proceed with right leg amputation.  The patient will follow up with me in the office after the procedure.    2. Coronary artery disease of native artery of native heart with stable angina pectoris ([place]) Continue cardiac and antihypertensive medications as already ordered and reviewed, no changes at this time.  Continue statin as ordered and reviewed, no changes at this time  Nitrates PRN for chest pain   3. Essential hypertension Continue antihypertensive medications as already ordered,  these medications have been reviewed and there are no changes at this time.   4. Chronic obstructive pulmonary disease, unspecified COPD type ([place]) Continue pulmonary medications and aerosols as already ordered, these medications have been reviewed and there are no changes at this time.    5. Type 2 diabetes mellitus with complication, with long-term current use of insulin (H[facility]) Continue hypoglycemic medications as already ordered, these medications have been reviewed and there are no changes at this time.  Hgb A1C to be monitored as already arranged by primary service   6. Mixed hyperlipidemia Continue statin as ordered and reviewed, no changes at this time    Hortencia Pilar, MD  03/29/2018 1:10 PM

## 2018-03-30 ENCOUNTER — Encounter (INDEPENDENT_AMBULATORY_CARE_PROVIDER_SITE_OTHER): Payer: Medicaid Other

## 2018-03-30 ENCOUNTER — Inpatient Hospital Stay
Admission: EM | Admit: 2018-03-30 | Discharge: 2018-04-07 | DRG: 854 | Disposition: A | Discharge status: Home Health | Payer: Medicaid Other | Attending: Internal Medicine | Admitting: Internal Medicine

## 2018-03-30 ENCOUNTER — Emergency Department: Payer: Medicaid Other

## 2018-03-30 ENCOUNTER — Other Ambulatory Visit (INDEPENDENT_AMBULATORY_CARE_PROVIDER_SITE_OTHER): Payer: Self-pay | Admitting: Nurse Practitioner

## 2018-03-30 ENCOUNTER — Ambulatory Visit (INDEPENDENT_AMBULATORY_CARE_PROVIDER_SITE_OTHER): Payer: Medicaid Other | Admitting: Vascular Surgery

## 2018-03-30 ENCOUNTER — Other Ambulatory Visit: Payer: Self-pay

## 2018-03-30 DIAGNOSIS — E871 Hypo-osmolality and hyponatremia: Secondary | ICD-10-CM | POA: Diagnosis present

## 2018-03-30 DIAGNOSIS — A419 Sepsis, unspecified organism: Principal | ICD-10-CM | POA: Diagnosis present

## 2018-03-30 DIAGNOSIS — E785 Hyperlipidemia, unspecified: Secondary | ICD-10-CM | POA: Diagnosis present

## 2018-03-30 DIAGNOSIS — E1140 Type 2 diabetes mellitus with diabetic neuropathy, unspecified: Secondary | ICD-10-CM | POA: Diagnosis present

## 2018-03-30 DIAGNOSIS — F17210 Nicotine dependence, cigarettes, uncomplicated: Secondary | ICD-10-CM | POA: Diagnosis present

## 2018-03-30 DIAGNOSIS — F419 Anxiety disorder, unspecified: Secondary | ICD-10-CM | POA: Diagnosis present

## 2018-03-30 DIAGNOSIS — M1990 Unspecified osteoarthritis, unspecified site: Secondary | ICD-10-CM | POA: Diagnosis present

## 2018-03-30 DIAGNOSIS — L03115 Cellulitis of right lower limb: Secondary | ICD-10-CM | POA: Diagnosis not present

## 2018-03-30 DIAGNOSIS — E1169 Type 2 diabetes mellitus with other specified complication: Secondary | ICD-10-CM | POA: Diagnosis present

## 2018-03-30 DIAGNOSIS — I5042 Chronic combined systolic (congestive) and diastolic (congestive) heart failure: Secondary | ICD-10-CM | POA: Diagnosis present

## 2018-03-30 DIAGNOSIS — I2510 Atherosclerotic heart disease of native coronary artery without angina pectoris: Secondary | ICD-10-CM | POA: Diagnosis present

## 2018-03-30 DIAGNOSIS — Z8249 Family history of ischemic heart disease and other diseases of the circulatory system: Secondary | ICD-10-CM

## 2018-03-30 DIAGNOSIS — T40605A Adverse effect of unspecified narcotics, initial encounter: Secondary | ICD-10-CM | POA: Diagnosis not present

## 2018-03-30 DIAGNOSIS — L97519 Non-pressure chronic ulcer of other part of right foot with unspecified severity: Secondary | ICD-10-CM | POA: Diagnosis present

## 2018-03-30 DIAGNOSIS — E1151 Type 2 diabetes mellitus with diabetic peripheral angiopathy without gangrene: Secondary | ICD-10-CM | POA: Diagnosis present

## 2018-03-30 DIAGNOSIS — Z79891 Long term (current) use of opiate analgesic: Secondary | ICD-10-CM

## 2018-03-30 DIAGNOSIS — Z7982 Long term (current) use of aspirin: Secondary | ICD-10-CM

## 2018-03-30 DIAGNOSIS — G2581 Restless legs syndrome: Secondary | ICD-10-CM | POA: Diagnosis present

## 2018-03-30 DIAGNOSIS — K5903 Drug induced constipation: Secondary | ICD-10-CM | POA: Diagnosis not present

## 2018-03-30 DIAGNOSIS — Z7984 Long term (current) use of oral hypoglycemic drugs: Secondary | ICD-10-CM

## 2018-03-30 DIAGNOSIS — N400 Enlarged prostate without lower urinary tract symptoms: Secondary | ICD-10-CM | POA: Diagnosis present

## 2018-03-30 DIAGNOSIS — Z7902 Long term (current) use of antithrombotics/antiplatelets: Secondary | ICD-10-CM

## 2018-03-30 DIAGNOSIS — G4730 Sleep apnea, unspecified: Secondary | ICD-10-CM | POA: Diagnosis present

## 2018-03-30 DIAGNOSIS — J449 Chronic obstructive pulmonary disease, unspecified: Secondary | ICD-10-CM | POA: Diagnosis present

## 2018-03-30 DIAGNOSIS — Z79899 Other long term (current) drug therapy: Secondary | ICD-10-CM

## 2018-03-30 DIAGNOSIS — M869 Osteomyelitis, unspecified: Secondary | ICD-10-CM | POA: Diagnosis not present

## 2018-03-30 DIAGNOSIS — I110 Hypertensive heart disease with heart failure: Secondary | ICD-10-CM | POA: Diagnosis present

## 2018-03-30 DIAGNOSIS — D62 Acute posthemorrhagic anemia: Secondary | ICD-10-CM | POA: Diagnosis not present

## 2018-03-30 DIAGNOSIS — E11621 Type 2 diabetes mellitus with foot ulcer: Secondary | ICD-10-CM | POA: Diagnosis present

## 2018-03-30 DIAGNOSIS — E875 Hyperkalemia: Secondary | ICD-10-CM | POA: Diagnosis not present

## 2018-03-30 DIAGNOSIS — Z6834 Body mass index (BMI) 34.0-34.9, adult: Secondary | ICD-10-CM

## 2018-03-30 DIAGNOSIS — Z955 Presence of coronary angioplasty implant and graft: Secondary | ICD-10-CM

## 2018-03-30 DIAGNOSIS — M86179 Other acute osteomyelitis, unspecified ankle and foot: Secondary | ICD-10-CM | POA: Diagnosis present

## 2018-03-30 DIAGNOSIS — E11610 Type 2 diabetes mellitus with diabetic neuropathic arthropathy: Secondary | ICD-10-CM | POA: Diagnosis present

## 2018-03-30 DIAGNOSIS — I252 Old myocardial infarction: Secondary | ICD-10-CM

## 2018-03-30 DIAGNOSIS — Z888 Allergy status to other drugs, medicaments and biological substances status: Secondary | ICD-10-CM

## 2018-03-30 HISTORY — DX: Sepsis, unspecified organism: A41.9

## 2018-03-30 LAB — CBC WITH DIFFERENTIAL/PLATELET
Abs Immature Granulocytes: 0.05 10*3/uL (ref 0.00–0.07)
Abs Immature Granulocytes: 0.05 10*3/uL (ref 0.00–0.07)
Basophils Absolute: 0 10*3/uL (ref 0.0–0.1)
Basophils Absolute: 0 10*3/uL (ref 0.0–0.1)
Basophils Relative: 0 %
Basophils Relative: 0 %
Eosinophils Absolute: 0 10*3/uL (ref 0.0–0.5)
Eosinophils Absolute: 0 10*3/uL (ref 0.0–0.5)
Eosinophils Relative: 0 %
Eosinophils Relative: 0 %
HCT: 35.4 % — ABNORMAL LOW (ref 39.0–52.0)
HCT: 30.5 % — ABNORMAL LOW (ref 39.0–52.0)
Hemoglobin: 10.7 g/dL — ABNORMAL LOW (ref 13.0–17.0)
Hemoglobin: 9.1 g/dL — ABNORMAL LOW (ref 13.0–17.0)
Immature Granulocytes: 1 %
Immature Granulocytes: 1 %
Lymphocytes Relative: 7 %
Lymphocytes Relative: 7 %
Lymphs Abs: 0.7 10*3/uL (ref 0.7–4.0)
Lymphs Abs: 0.8 10*3/uL (ref 0.7–4.0)
MCH: 20.9 pg — ABNORMAL LOW (ref 26.0–34.0)
MCH: 21 pg — ABNORMAL LOW (ref 26.0–34.0)
MCHC: 30.2 g/dL (ref 30.0–36.0)
MCHC: 29.8 g/dL — ABNORMAL LOW (ref 30.0–36.0)
MCV: 69.1 fL — ABNORMAL LOW (ref 80.0–100.0)
MCV: 70.4 fL — ABNORMAL LOW (ref 80.0–100.0)
Monocytes Absolute: 0.6 10*3/uL (ref 0.1–1.0)
Monocytes Absolute: 0.7 10*3/uL (ref 0.1–1.0)
Monocytes Relative: 6 %
Monocytes Relative: 6 %
Neutro Abs: 9.4 10*3/uL — ABNORMAL HIGH (ref 1.7–7.7)
Neutro Abs: 8.7 10*3/uL — ABNORMAL HIGH (ref 1.7–7.7)
Neutrophils Relative %: 86 %
Neutrophils Relative %: 86 %
Platelets: 353 10*3/uL (ref 150–400)
Platelets: 265 10*3/uL (ref 150–400)
RBC: 5.12 MIL/uL (ref 4.22–5.81)
RBC: 4.33 MIL/uL (ref 4.22–5.81)
RDW: 21.1 % — ABNORMAL HIGH (ref 11.5–15.5)
RDW: 20.5 % — ABNORMAL HIGH (ref 11.5–15.5)
WBC: 10.9 10*3/uL — ABNORMAL HIGH (ref 4.0–10.5)
WBC: 10.3 10*3/uL (ref 4.0–10.5)
nRBC: 0 % (ref 0.0–0.2)
nRBC: 0 % (ref 0.0–0.2)

## 2018-03-30 LAB — COMPREHENSIVE METABOLIC PANEL
ALT: 17 U/L (ref 0–44)
ALT: 16 U/L (ref 0–44)
AST: 22 U/L (ref 15–41)
AST: 17 U/L (ref 15–41)
Albumin: 2.9 g/dL — ABNORMAL LOW (ref 3.5–5.0)
Albumin: 2.3 g/dL — ABNORMAL LOW (ref 3.5–5.0)
Alkaline Phosphatase: 114 U/L (ref 38–126)
Alkaline Phosphatase: 89 U/L (ref 38–126)
Anion gap: 8 (ref 5–15)
Anion gap: 6 (ref 5–15)
BUN: 16 mg/dL (ref 6–20)
BUN: 16 mg/dL (ref 6–20)
CO2: 31 mmol/L (ref 22–32)
CO2: 31 mmol/L (ref 22–32)
Calcium: 9.2 mg/dL (ref 8.9–10.3)
Calcium: 8.5 mg/dL — ABNORMAL LOW (ref 8.9–10.3)
Chloride: 90 mmol/L — ABNORMAL LOW (ref 98–111)
Chloride: 92 mmol/L — ABNORMAL LOW (ref 98–111)
Creatinine, Ser: 0.86 mg/dL (ref 0.61–1.24)
Creatinine, Ser: 1.27 mg/dL — ABNORMAL HIGH (ref 0.61–1.24)
GFR calc Af Amer: >60 mL/min (ref >60)
GFR calc Af Amer: >60 mL/min (ref >60)
GFR calc non Af Amer: >60 mL/min (ref >60)
GFR calc non Af Amer: >60 mL/min (ref >60)
Glucose, Bld: 191 mg/dL — ABNORMAL HIGH (ref 70–99)
Glucose, Bld: 284 mg/dL — ABNORMAL HIGH (ref 70–99)
Potassium: 5 mmol/L (ref 3.5–5.1)
Potassium: 5.3 mmol/L — ABNORMAL HIGH (ref 3.5–5.1)
Sodium: 129 mmol/L — ABNORMAL LOW (ref 135–145)
Sodium: 129 mmol/L — ABNORMAL LOW (ref 135–145)
Total Bilirubin: 0.8 mg/dL (ref 0.3–1.2)
Total Bilirubin: 0.8 mg/dL (ref 0.3–1.2)
Total Protein: 7.8 g/dL (ref 6.5–8.1)
Total Protein: 6.3 g/dL — ABNORMAL LOW (ref 6.5–8.1)

## 2018-03-30 LAB — LACTIC ACID, PLASMA
Lactic Acid, Venous: 2.3 mmol/L (ref 0.5–1.9)
Lactic Acid, Venous: 1.7 mmol/L (ref 0.5–1.9)
Lactic Acid, Venous: 1.5 mmol/L (ref 0.5–1.9)

## 2018-03-30 LAB — PROTIME-INR
INR: 1.25
Prothrombin Time: 15.6 seconds — ABNORMAL HIGH (ref 11.4–15.2)

## 2018-03-30 LAB — PROCALCITONIN: Procalcitonin: 0.1 ng/mL

## 2018-03-30 LAB — APTT: aPTT: 36 seconds (ref 24–36)

## 2018-03-30 MED ORDER — ACETAMINOPHEN 650 MG RE SUPP: 650.0000 mg | Freq: Four times a day (QID) | RECTAL | Status: DC | PRN

## 2018-03-30 MED ORDER — ALPRAZOLAM 0.5 MG PO TABS
0.5000 mg | ORAL_TABLET | Freq: Three times a day (TID) | ORAL | Status: DC | PRN
  Administered 2018-03-31 – 2018-04-07 (×10): 0.5 mg via ORAL
  Filled 2018-03-30 (×10): qty 1

## 2018-03-30 MED ORDER — OXYCODONE HCL 5 MG PO TABS
5.0000 mg | ORAL_TABLET | ORAL | Status: DC | PRN
  Administered 2018-03-30 – 2018-04-02 (×9): 5 mg via ORAL
  Filled 2018-03-30 (×9): qty 1

## 2018-03-30 MED ORDER — NICOTINE 21 MG/24HR TD PT24
21.0000 mg | MEDICATED_PATCH | Freq: Every day | TRANSDERMAL | Status: DC
  Administered 2018-03-31 – 2018-04-07 (×8): 21 mg via TRANSDERMAL
  Filled 2018-03-30 (×8): qty 1

## 2018-03-30 MED ORDER — NICOTINE 21 MG/24HR TD PT24: 21.0000 mg | MEDICATED_PATCH | Freq: Every day | TRANSDERMAL | Status: DC

## 2018-03-30 MED ORDER — ONDANSETRON HCL 4 MG/2ML IJ SOLN
4.0000 mg | Freq: Once | INTRAMUSCULAR | Status: AC
  Administered 2018-03-30: 4 mg via INTRAVENOUS
  Filled 2018-03-30: qty 2

## 2018-03-30 MED ORDER — FENTANYL CITRATE (PF) 100 MCG/2ML IJ SOLN
50.0000 ug | Freq: Once | INTRAMUSCULAR | Status: AC
  Administered 2018-03-30: 50 ug via INTRAVENOUS
  Filled 2018-03-30: qty 2

## 2018-03-30 MED ORDER — VANCOMYCIN HCL IN DEXTROSE 1-5 GM/200ML-% IV SOLN
1000.0000 mg | Freq: Once | INTRAVENOUS | Status: AC
  Administered 2018-03-30: 1000 mg via INTRAVENOUS
  Filled 2018-03-30: qty 200

## 2018-03-30 MED ORDER — ONDANSETRON HCL 4 MG PO TABS
4.0000 mg | ORAL_TABLET | Freq: Four times a day (QID) | ORAL | Status: DC | PRN
  Administered 2018-04-01: 4 mg via ORAL
  Filled 2018-03-30: qty 1

## 2018-03-30 MED ORDER — SODIUM CHLORIDE 0.9 % IV SOLN
INTRAVENOUS | Status: DC
  Administered 2018-03-30 – 2018-04-02 (×7): via INTRAVENOUS

## 2018-03-30 MED ORDER — ONDANSETRON HCL 4 MG/2ML IJ SOLN: 4.0000 mg | Freq: Four times a day (QID) | INTRAMUSCULAR | Status: DC | PRN

## 2018-03-30 MED ORDER — PIPERACILLIN-TAZOBACTAM 3.375 G IVPB
3.3750 g | Freq: Three times a day (TID) | INTRAVENOUS | Status: DC
  Administered 2018-03-30 – 2018-04-02 (×8): 3.375 g via INTRAVENOUS
  Filled 2018-03-30 (×8): qty 50

## 2018-03-30 MED ORDER — NICOTINE 14 MG/24HR TD PT24: 14.0000 mg | MEDICATED_PATCH | Freq: Every day | TRANSDERMAL | Status: DC

## 2018-03-30 MED ORDER — SODIUM CHLORIDE 0.9 % IV BOLUS
1000.0000 mL | Freq: Once | INTRAVENOUS | Status: AC
  Administered 2018-03-30: 1000 mL via INTRAVENOUS

## 2018-03-30 MED ORDER — ACETAMINOPHEN 500 MG PO TABS
1000.0000 mg | ORAL_TABLET | Freq: Once | ORAL | Status: AC
  Administered 2018-03-30: 1000 mg via ORAL
  Filled 2018-03-30: qty 2

## 2018-03-30 MED ORDER — VANCOMYCIN HCL 10 G IV SOLR
1500.0000 mg | Freq: Two times a day (BID) | INTRAVENOUS | Status: DC
  Administered 2018-03-31: 1500 mg via INTRAVENOUS
  Filled 2018-03-30 (×2): qty 1500

## 2018-03-30 MED ORDER — ORAL CARE MOUTH RINSE
15.0000 mL | Freq: Two times a day (BID) | OROMUCOSAL | Status: DC
  Administered 2018-03-31 – 2018-04-07 (×11): 15 mL via OROMUCOSAL

## 2018-03-30 MED ORDER — MORPHINE SULFATE (PF) 2 MG/ML IV SOLN
2.0000 mg | INTRAVENOUS | Status: DC | PRN
  Administered 2018-03-31 – 2018-04-01 (×7): 2 mg via INTRAVENOUS
  Filled 2018-03-30 (×7): qty 1

## 2018-03-30 MED ORDER — ACETAMINOPHEN 325 MG PO TABS
650.0000 mg | ORAL_TABLET | Freq: Four times a day (QID) | ORAL | Status: DC | PRN
  Administered 2018-04-06: 650 mg via ORAL
  Filled 2018-03-30: qty 2

## 2018-03-30 MED ORDER — PIPERACILLIN-TAZOBACTAM 3.375 G IVPB 30 MIN
3.3750 g | Freq: Once | INTRAVENOUS | Status: AC
  Administered 2018-03-30: 3.375 g via INTRAVENOUS
  Filled 2018-03-30: qty 50

## 2018-03-30 MED ORDER — VANCOMYCIN HCL IN DEXTROSE 1-5 GM/200ML-% IV SOLN: 1000.0000 mg | Freq: Once | INTRAVENOUS | Status: DC

## 2018-03-30 NOTE — Consult Note (Signed)
Reason for Consult: Osteomyelitis right foot Referring Physician: Regis Hinton is an 57 y.o. male.  HPI: This is a 57 year old male with diabetes and associated neuropathy with a chronic history of osteomyelitis in his right foot.  Patient has had multiple debridements on the right foot and recently decision was made for below-knee amputation as the foot is nonsalvageable.  Was scheduled for below-knee amputation this Friday but presented to the emergency department due to recent fevers.  Past Medical History:  Diagnosis Date  . Arthritis   . Asthma   . Atherosclerosis   . BPH (benign prostatic hyperplasia)   . Carotid arterial disease ([place])   . Charcot's joint of foot, right   . Chronic combined systolic (congestive) and diastolic (congestive) heart failure ([place])    a. 02/2013 EF 50% by LV gram; b. 04/2017 Echo: EF 25-30%. diff HK. Gr2 DD. Mod dil LA/RV. PASP 70mHg.  .Marland KitchenCOPD (chronic obstructive pulmonary disease) ([place]   . Coronary artery disease    a. 2013 S/P PCI of LAD ([facility];  b. 03/2013 PCI: RCA 90p ( 2.5 x 23 mm DES); c. 02/2014 Cath: patent RCA stent->Med Rx; d. 04/2017 Cath: LM nl, LAd 30ost/p, 67m, D1 80ost, LCX 90p/m, OM1 85, RCA 70/20p, 8023m0d->Referred for CT Surg-felt to be poor candidate.  . Diabetic neuropathy (HCCMoody . Diabetic ulcer of right foot (HCCDelevan . Hernia   . Hyperlipidemia   . Hypertension   . Ischemic cardiomyopathy    a. 04/2017 Echo: EF 25-30%; b. 08/2017 Cardiac MRI ([facility]): EF 19%, sev glob HK. RVEF 20%, mod BAE, triv MR, mild-mod TR. Basal lateral subendocardial infarct (viable), inf/infsept, dist septal ischemia, basal to mid lat peri-infarct ischemia.  . Morbid obesity (HCCManassa . Neuropathy   . PAD (peripheral artery disease) (HCC[place] Valley  a. Followed by Dr. DewLucky Cowboy. 08/2010 Periph Angio: RSFA 70-80p (6X60 self-expanding stent); c. 09/2010 Periph Angio: L SFA 100p (7x unknown length self-expanding stent); d. 06/2015 Periph Angio: R SFA short segment  occlusion (6x12 self-expanding stent).  . Restless leg syndrome   . Sleep apnea   . Subclavian artery stenosis, right (HCCRatamosa . Syncope and collapse   . Tobacco use    a. 75+ yr hx - still smoking 1ppd, down from 2 ppd.  . Type II diabetes mellitus (HCCTrafford . Varicose vein     Past Surgical History:  Procedure Laterality Date  . ABDOMINAL AORTAGRAM N/A 06/23/2013   Procedure: ABDOMINAL AORMaxcine HamSurgeon: MuhWellington HampshireD;  [facility];  Service: Cardiovascular;  Laterality: N/A;  . APPENDECTOMY    . CARDIAC CATHETERIZATION  10/14   [facility]; X1 STENT PROXIMAL RCA  . CARDIAC CATHETERIZATION  02/2010   [facility]  . CARDIAC CAT[facility]IZATION  [facility]/2014   [facility]  . CLAVICLE SURGERY    . HERNIA REPAIR    . IRRIGATION AND DEBRIDEMENT FOOT Right 01/04/2018   Procedure: IRRIGATION AND DEBRIDEMENT FOOT;  Surgeon: FowSamara DeistPM;  Location: [facility];  Service: Podiatry;  Laterality: Right;  . LEFT HEART CATH AND CORS/GRAFTS ANGIOGRAPHY N/A 04/28/2017   Procedure: LEFT HEART CATH AND CORONARY ANGIOGRAPHY;  Surgeon: AriWellington HampshireD;  [facility];  Service: Cardiovascular;  Laterality: N/A;  . LOWER EXTREMITY ANGIOGRAPHY Right 03/03/2018   Procedure: LOWER EXTREMITY ANGIOGRAPHY;  Surgeon: SchKatha CabalD;  [facility];  Service: Cardiovascular;  Laterality: Right;  .  PERIPHERAL ARTERIAL STENT GRAFT     x2 left/right  . PERIPHERAL VASCULAR BALLOON ANGIOPLASTY Right 01/06/2018   Procedure: PERIPHERAL VASCULAR BALLOON ANGIOPLASTY;  Surgeon: Katha Cabal, MD;  Location: [place] [facility];  Service: Cardiovascular;  Laterality: Right;  . PERIPHERAL VASCULAR CATHETERIZATION N/A 07/06/2015   Procedure: Abdominal Aortogram w/Lower Extremity;  Surgeon: Algernon Huxley, MD;  Location: [place] [facility];  Service: Cardiovascular;  Laterality: N/A;  . PERIPHERAL VASCULAR CATHETERIZATION  07/06/2015   Procedure: Lower Extremity Intervention;   Surgeon: Algernon Huxley, MD;  Location: [place] [facility];  Service: Cardiovascular;;    Family History  Problem Relation Age of Onset  . Heart attack Father   . Heart disease Father   . Hypertension Father   . Hyperlipidemia Father   . Hypertension Mother   . Hyperlipidemia Mother     Social History:  reports that he has been smoking cigarettes. He has a 41.00 pack-year smoking history. He has never used smokeless tobacco. He reports that he does not drink alcohol or use drugs.  Allergies:  Allergies  Allergen Reactions  . Dulaglutide Anaphylaxis, Diarrhea and Hives  . Prednisone Rash    Medications:  Scheduled: . mouth rinse  15 mL Mouth Rinse BID  . [START ON 03/31/2018] nicotine  21 mg Transdermal Daily    Results for orders placed or performed during the hospital encounter of 03/30/18 (from the past 48 hour(s))  Comprehensive metabolic panel     Status: Abnormal   Collection Time: 03/30/18  2:40 PM  Result Value Ref Range   Sodium 129 (L) 135 - 145 mmol/L   Potassium 5.0 3.5 - 5.1 mmol/L   Chloride 90 (L) 98 - 111 mmol/L   CO2 31 22 - 32 mmol/L   Glucose, Bld 191 (H) 70 - 99 mg/dL   BUN 16 6 - 20 mg/dL   Creatinine, Ser 0.86 0.61 - 1.24 mg/dL   Calcium 9.2 8.9 - 10.3 mg/dL   Total Protein 7.8 6.5 - 8.1 g/dL   Albumin 2.9 (L) 3.5 - 5.0 g/dL   AST 22 15 - 41 U/L   ALT 17 0 - 44 U/L   Alkaline Phosphatase 114 38 - 126 U/L   Total Bilirubin 0.8 0.3 - 1.2 mg/dL   GFR calc non Af Amer >60 >60 mL/min   GFR calc Af Amer >60 >60 mL/min    Comment: (NOTE) The eGFR has been calculated using the CKD EPI equation. This calculation has not been validated in all clinical situations. eGFR's persistently <60 mL/min signify possible Chronic Kidney Disease.    Anion gap 8 5 - 15    Comment: Performed at [facility], [address] [place], [place] [address]  CBC WITH DIFFERENTIAL     Status: Abnormal   Collection Time: 03/30/18  2:40 PM  Result Value Ref Range    WBC 10.9 (H) 4.0 - 10.5 K/uL   RBC 5.12 4.22 - 5.81 MIL/uL   Hemoglobin 10.7 (L) 13.0 - 17.0 g/dL   HCT 35.4 (L) 39.0 - 52.0 %   MCV 69.1 (L) 80.0 - 100.0 fL    Comment: REPEATED TO VERIFY   MCH 20.9 (L) 26.0 - 34.0 pg   MCHC 30.2 30.0 - 36.0 g/dL   RDW 21.1 (H) 11.5 - 15.5 %    Comment: REPEATED TO VERIFY   Platelets 353 150 - 400 K/uL   nRBC 0.0 0.0 - 0.2 %   Neutrophils Relative % 86 %  Neutro Abs 9.4 (H) 1.7 - 7.7 K/uL   Lymphocytes Relative 7 %   Lymphs Abs 0.7 0.7 - 4.0 K/uL   Monocytes Relative 6 %   Monocytes Absolute 0.6 0.1 - 1.0 K/uL   Eosinophils Relative 0 %   Eosinophils Absolute 0.0 0.0 - 0.5 K/uL   Basophils Relative 0 %   Basophils Absolute 0.0 0.0 - 0.1 K/uL   Immature Granulocytes 1 %   Abs Immature Granulocytes 0.05 0.00 - 0.07 K/uL    Comment: Performed at [facility], [address] [place], [place] [address]  Lactic acid, plasma     Status: Abnormal   Collection Time: 03/30/18  2:40 PM  Result Value Ref Range   Lactic Acid, Venous 2.3 (HH) 0.5 - 1.9 mmol/L    Comment: CRITICAL RESULT CALLED TO, READ BACK BY AND VERIFIED WITH C/DEVIN [facility] 03/30/18 1515 JML Performed at [facility], [address] [place], [place] [address]   Lactic acid, plasma     Status: None   Collection Time: 03/30/18  5:27 PM  Result Value Ref Range   Lactic Acid, Venous 1.7 0.5 - 1.9 mmol/L    Comment: Performed at [facility], [address] [place], [place] [address]  CBC with Differential     Status: Abnormal   Collection Time: 03/30/18  7:03 PM  Result Value Ref Range   WBC 10.3 4.0 - 10.5 K/uL   RBC 4.33 4.22 - 5.81 MIL/uL   Hemoglobin 9.1 (L) 13.0 - 17.0 g/dL   HCT 30.5 (L) 39.0 - 52.0 %   MCV 70.4 (L) 80.0 - 100.0 fL   MCH 21.0 (L) 26.0 - 34.0 pg   MCHC 29.8 (L) 30.0 - 36.0 g/dL   RDW 20.5 (H) 11.5 - 15.5 %   Platelets 265 150 - 400 K/uL   nRBC 0.0 0.0 - 0.2 %   Neutrophils Relative % 86 %   Neutro Abs 8.7 (H) 1.7 - 7.7  K/uL   Lymphocytes Relative 7 %   Lymphs Abs 0.8 0.7 - 4.0 K/uL   Monocytes Relative 6 %   Monocytes Absolute 0.7 0.1 - 1.0 K/uL   Eosinophils Relative 0 %   Eosinophils Absolute 0.0 0.0 - 0.5 K/uL   Basophils Relative 0 %   Basophils Absolute 0.0 0.0 - 0.1 K/uL   Immature Granulocytes 1 %   Abs Immature Granulocytes 0.05 0.00 - 0.07 K/uL    Comment: Performed at [facility], [address] [place],  [address]  Comprehensive metabolic panel     Status: Abnormal   Collection Time: 03/30/18  7:03 PM  Result Value Ref Range   Sodium 129 (L) 135 - 145 mmol/L   Potassium 5.3 (H) 3.5 - 5.1 mmol/L   Chloride 92 (L) 98 - 111 mmol/L   CO2 31 22 - 32 mmol/L   Glucose, Bld 284 (H) 70 - 99 mg/dL   BUN 16 6 - 20 mg/dL   Creatinine, Ser 1.27 (H) 0.61 - 1.24 mg/dL   Calcium 8.5 (L) 8.9 - 10.3 mg/dL   Total Protein 6.3 (L) 6.5 - 8.1 g/dL   Albumin 2.3 (L) 3.5 - 5.0 g/dL   AST 17 15 - 41 U/L   ALT 16 0 - 44 U/L   Alkaline Phosphatase 89 38 - 126 U/L   Total Bilirubin 0.8 0.3 - 1.2 mg/dL   GFR calc non Af Amer >60 >60 mL/min   GFR calc Af Amer >60 >60  mL/min    Comment: (NOTE) The eGFR has been calculated using the CKD EPI equation. This calculation has not been validated in all clinical situations. eGFR's persistently <60 mL/min signify possible Chronic Kidney Disease.    Anion gap 6 5 - 15    Comment: Performed at [facility] [place] [facility], [place], [place] [address]  Lactic acid, plasma     Status: None   Collection Time: 03/30/18  7:03 PM  Result Value Ref Range   Lactic Acid, Venous 1.5 0.5 - 1.9 mmol/L    Comment: Performed at [facility], [address] [place], [place] [address]  Protime-INR     Status: Abnormal   Collection Time: 03/30/18  7:03 PM  Result Value Ref Range   Prothrombin Time 15.6 (H) 11.4 - 15.2 seconds   INR 1.25     Comment: Performed at [facility], [address] [place], [place] [address]  APTT      Status: None   Collection Time: 03/30/18  7:03 PM  Result Value Ref Range   aPTT 36 24 - 36 seconds    Comment: Performed at [facility], [address] [place], [place] [address]    Dg Chest Port 1 View  Result Date: 03/30/2018 CLINICAL DATA:  Fever for several days EXAM: PORTABLE CHEST 1 VIEW COMPARISON:  01/03/2018 FINDINGS: Cardiac shadow is enlarged. Aortic calcifications are again seen. No focal infiltrate or sizable effusion is seen. No bony abnormality is noted. IMPRESSION: No acute abnormality noted. Electronically Signed   By: Inez Catalina M.D.   On: 03/30/2018 15:05   Dg Foot Complete Right  Result Date: 03/30/2018 CLINICAL DATA:  57 y/o  M; 3 days of fever. Known right foot wound. EXAM: RIGHT FOOT COMPLETE - 3+ VIEW COMPARISON:  01/02/2018 right foot radiograph. 01/03/2018 right foot MRI. FINDINGS: Extensive diffuse bony erosive changes of the tarsometatarsal and intertarsal joints greatest laterally compatible with osteomyelitis. Associated interval disorganization and collapse of the midfoot. Extensive surrounding soft tissue swelling with lateral soft tissue ulcer. IMPRESSION: Progressive extensive bony erosive changes of the tarsometatarsal and intertarsal joints greatest laterally compatible with osteomyelitis. Electronically Signed   By: Kristine Garbe M.D.   On: 03/30/2018 16:12    Review of Systems  Constitutional: Negative for chills and fever.  HENT: Negative.   Eyes: Negative.   Respiratory: Negative.   Cardiovascular: Positive for leg swelling.       On the right leg.  Gastrointestinal: Negative for nausea and vomiting.  Genitourinary: Negative.   Musculoskeletal: Negative.   Skin:       Chronic draining wound on the bottom of his right foot  Neurological:       Patient does relate neuropathy due to his diabetes  Endo/Heme/Allergies: Negative.   Psychiatric/Behavioral: Negative.    Blood pressure 115/74, pulse 99, temperature 98.3 F  (36.8 C), temperature source Oral, resp. rate 16, height _0  (1.702 m), weight 99.8 kg, SpO2 100 %. Physical Exam  Cardiovascular:  DP and PT pulses are difficult to palpate on the right.  Diminished 1/4 on the left.  Musculoskeletal:  Instability is noted in the right midfoot due to Charcot changes.  Neurological:  Loss of sensation distally in the feet and toes.  Skin:  Significant edema and discoloration in the right lower extremity.  The skin is thin dry and somewhat atrophic with him in his chair growth.  Full-thickness necrotic wound is present on the plantar aspect of the right  midfoot which probes down to the level of bone.    Assessment/Plan: Assessment: Chronic osteomyelitis with Charcot changes right foot.  Diabetes with associated neuropathy.  Plan: Patient was already scheduled for below-knee amputation of the right lower extremity on Friday.  After talking the patient it appears they may try to move this up to Wednesday.  The wound on the right foot was dressed with dry sterile gauze.  No further need for podiatry evaluation and will defer to vascular surgery for his amputation.  Durward Fortes 03/30/2018, 7:56 PM

## 2018-03-30 NOTE — ED Notes (Signed)
Date and time results received: 03/30/18 3:18 PM   Test: Lactic Acid Critical Value: 2.3 mmol/L  Name of Provider Notified: Dr. Alfred Levins

## 2018-03-30 NOTE — ED Triage Notes (Signed)
Pt comes into the ED via EMS from home with c/o fever x3 days with a known wound to the right foot with a scheduled below the knee amputation for this Friday. Pt is a/ox4 on arrival states he took IBU at 2pm PTA.

## 2018-03-30 NOTE — H&P (Signed)
[place] at [facility]   PATIENT NAME: [name] [name]    MR#:  [id number]  DATE OF BIRTH:  09/27/1960  DATE OF ADMISSION:  03/30/2018  PRIMARY CARE PHYSICIAN: Clarisse Gouge, MD   REQUESTING/REFERRING PHYSICIAN: Rudene Re, MD  CHIEF COMPLAINT:  Right foot wound infection  HISTORY OF PRESENT ILLNESS:  [name] [name]  is a 57 y.o. male with a known history of chronic diabetic foot ulcer, sees Dr. Vickki Muff and Dr. Delana Meyer as an outpatient, chronic systolic congestive heart failure, diabetes metas, hypertension, coronary artery disease, hyperlipidemia and other medical problems continues to smoke is presenting to the ED with a chief complaint of fever and worsening of the foot infection.  Patient is actually scheduled to get right BKA by Dr. Delana Meyer in 4 days patient is reporting 3-day history of fever associated with redness and warmth of right lower extremity.  Denies any chest pain shortness of breath denies any nausea vomiting diarrhea  PAST MEDICAL HISTORY:   Past Medical History:  Diagnosis Date  . Arthritis   . Asthma   . Atherosclerosis   . BPH (benign prostatic hyperplasia)   . Carotid arterial disease ([place])   . Charcot's joint of foot, right   . Chronic combined systolic (congestive) and diastolic (congestive) heart failure ([place])    a. 02/2013 EF 50% by LV gram; b. 04/2017 Echo: EF 25-30%. diff HK. Gr2 DD. Mod dil LA/RV. PASP 31mHg.  .Marland KitchenCOPD (chronic obstructive pulmonary disease) ([place]   . Coronary artery disease    a. 2013 S/P PCI of LAD ([facility];  b. 03/2013 PCI: RCA 90p ( 2.5 x 23 mm DES); c. 02/2014 Cath: patent RCA stent->Med Rx; d. 04/2017 Cath: LM nl, LAd 30ost/p, 664m, D1 80ost, LCX 90p/m, OM1 85, RCA 70/20p, 8060m0d->Referred for CT Surg-felt to be poor candidate.  . Diabetic neuropathy (HCCM[place] . Diabetic ulcer of right foot ([place]CC[place] . Hernia   . Hyperlipidemia   . Hypertension   . Ischemic cardiomyopathy    a. 04/2017  Echo: EF 25-30%; b. 08/2017 Cardiac MRI ([facility]): EF 19%, sev glob HK. RVEF 20%, mod BAE, triv MR, mild-mod TR. Basal lateral subendocardial infarct (viable), inf/infsept, dist septal ischemia, basal to mid lat peri-infarct ischemia.  . Morbid obesity ([place]CC[place] . Neuropathy   . PAD (peripheral artery disease) ([place]  a. Followed by Dr. DewLucky Cowboy. 08/2010 Periph Angio: RSFA 70-80p (6X60 self-expanding stent); c. 09/2010 Periph Angio: L SFA 100p (7x unknown length self-expanding stent); d. 06/2015 Periph Angio: R SFA short segment occlusion (6x12 self-expanding stent).  . Restless leg syndrome   . Sleep apnea   . Subclavian artery stenosis, right ([place] . Syncope and collapse   . Tobacco use    a. 75+ yr hx - still smoking 1ppd, down from 2 ppd.  . Type II diabetes mellitus ([place]CC[place] . Varicose vein     PAST SURGICAL HISTOIRY:   Past Surgical History:  Procedure Laterality Date  . ABDOMINAL AORTAGRAM N/A 06/23/2013   Procedure: ABDOMINAL AORMaxcine HamSurgeon: MuhWellington HampshireD;  [facility];  Service: Cardiovascular;  Laterality: N/A;  . APPENDECTOMY    . CARDIAC CATHETERIZATION  10/14   [facility]; X1 STENT PROXIMAL RCA  . CARDIAC CATHETERIZATION  02/2010   [facility]  . CARDIAC CATHE[facility]IZATION  [facility]/2014   [facility]  . CLAVICLE SURGERY    . HERNIA REPAIR    . IRRIGATION  AND DEBRIDEMENT FOOT Right 01/04/2018   Procedure: IRRIGATION AND DEBRIDEMENT FOOT;  Surgeon: Samara Deist, DPM;  Location: [facility] ORS;  Service: Podiatry;  Laterality: Right;  . LEFT HEART CATH AND CORS/GRAFTS ANGIOGRAPHY N/A 04/28/2017   Procedure: LEFT HEART CATH AND CORONARY ANGIOGRAPHY;  Surgeon: Wellington Hampshire, MD;  Location: [place] [facility];  Service: Cardiovascular;  Laterality: N/A;  . LOWER EXTREMITY ANGIOGRAPHY Right 03/03/2018   Procedure: LOWER EXTREMITY ANGIOGRAPHY;  Surgeon: Katha Cabal, MD;  Location: [place] CV LAB;  Service: Cardiovascular;  Laterality: Right;  . PERIPHERAL ARTERIAL STENT GRAFT      x2 left/right  . PERIPHERAL VASCULAR BALLOON ANGIOPLASTY Right 01/06/2018   Procedure: PERIPHERAL VASCULAR BALLOON ANGIOPLASTY;  Surgeon: Katha Cabal, MD;  Location: [place] [facility];  Service: Cardiovascular;  Laterality: Right;  . PERIPHERAL VASCULAR CATHETERIZATION N/A 07/06/2015   Procedure: Abdominal Aortogram w/Lower Extremity;  Surgeon: Algernon Huxley, MD;  Location: [place] CV LAB;  Service: Cardiovascular;  Laterality: N/A;  . PERIPHERAL VASCULAR CATHETERIZATION  07/06/2015   Procedure: Lower Extremity Intervention;  Surgeon: Algernon Huxley, MD;  Location: [place] CV LAB;  Service: Cardiovascular;;    SOCIAL HISTORY:   Social History   Tobacco Use  . Smoking status: Current Every Day Smoker    Packs/day: 1.00    Years: 41.00    Pack years: 41.00    Types: Cigarettes  . Smokeless tobacco: Never Used  Substance Use Topics  . Alcohol use: No    FAMILY HISTORY:   Family History  Problem Relation Age of Onset  . Heart attack Father   . Heart disease Father   . Hypertension Father   . Hyperlipidemia Father   . Hypertension Mother   . Hyperlipidemia Mother     DRUG ALLERGIES:   Allergies  Allergen Reactions  . Dulaglutide Anaphylaxis, Diarrhea and Hives  . Prednisone Rash    REVIEW OF SYSTEMS:  CONSTITUTIONAL: No fever, fatigue or weakness.  EYES: No blurred or double vision.  EARS, NOSE, AND THROAT: No tinnitus or ear pain.  RESPIRATORY: No cough, shortness of breath, wheezing or hemoptysis.  CARDIOVASCULAR: No chest pain, orthopnea, edema.  GASTROINTESTINAL: No nausea, vomiting, diarrhea or abdominal pain.  GENITOURINARY: No dysuria, hematuria.  ENDOCRINE: No polyuria, nocturia,  HEMATOLOGY: No anemia, easy bruising or bleeding SKIN: Right foot ventral aspect with open wound MUSCULOSKELETAL: No joint pain or arthritis.   NEUROLOGIC: No tingling, numbness, weakness.  PSYCHIATRY: No anxiety or depression.   MEDICATIONS AT HOME:   Prior  to Admission medications   Medication Sig Start Date End Date Taking? Authorizing Provider  albuterol (PROVENTIL HFA;VENTOLIN HFA) 108 (90 BASE) MCG/ACT inhaler Inhale 2 puffs into the lungs every 6 (six) hours as needed for wheezing or shortness of breath. 04/15/13   Wellington Hampshire, MD  aspirin EC 81 MG tablet Take 1 tablet (81 mg total) by mouth daily. 05/11/15   Wellington Hampshire, MD  atorvastatin (LIPITOR) 80 MG tablet Take 80 mg by mouth daily.    [provider]  calcium carbonate (TUMS - DOSED IN MG ELEMENTAL CALCIUM) 500 MG chewable tablet Chew 4 tablets by mouth daily as needed for indigestion or heartburn.    [provider]  clopidogrel (PLAVIX) 75 MG tablet Take 75 mg by mouth daily.  08/22/17   [provider]  diphenhydrAMINE (BENADRYL) 25 MG tablet Take 25 mg by mouth daily as needed for allergies.    [provider]  ezetimibe (ZETIA) 10 MG tablet Take 10 mg by mouth daily.    [provider]  furosemide (LASIX) 40 MG tablet Take 40 mg by mouth 2 (two) times daily.  01/15/18   [provider]  gabapentin (NEURONTIN) 800 MG tablet Take 800 mg by mouth 4 (four) times daily.  08/14/17 08/14/18  [provider]  Insulin Glargine (LANTUS SOLOSTAR) 100 UNIT/ML Solostar Pen Inject 70 Units into the skin at bedtime. Patient not taking: Reported on 02/25/2018 12/23/17   Gladstone Lighter, MD  ipratropium-albuterol (DUONEB) 0.5-2.5 (3) MG/3ML SOLN Inhale 3 mLs into the lungs every 6 (six) hours as needed (wheezing/sob).     [provider]  metFORMIN (GLUCOPHAGE-XR) 500 MG 24 hr tablet Take 500 mg by mouth daily with breakfast.    [provider]  multivitamin-lutein (OCUVITE-LUTEIN) CAPS capsule Take 1 capsule by mouth daily. 01/09/18   Demetrios Loll, MD  naproxen sodium (ALEVE) 220 MG tablet Take 440 mg by mouth daily as needed (pain).    [provider]  nicotine (NICODERM CQ - DOSED IN MG/24 HOURS) 14 mg/24hr  patch Place 1 patch (14 mg total) onto the skin daily. Patient not taking: Reported on 02/25/2018 11/23/17   Salary, Holly Bodily D, MD  nitroGLYCERIN (NITRODUR - DOSED IN MG/24 HR) 0.4 mg/hr patch Place 0.4 mg onto the skin daily.    [provider]  nitroGLYCERIN (NITROSTAT) 0.4 MG SL tablet Place 0.4 mg under the tongue every 5 (five) minutes as needed for chest pain.    [provider]  nutrition supplement, JUVEN, (JUVEN) PACK Take 1 packet by mouth 2 (two) times daily between meals. Patient not taking: Reported on 02/25/2018 01/09/18   Demetrios Loll, MD  Oxycodone HCl 10 MG TABS Take 10 mg by mouth every 6 (six) hours as needed (pain).  01/15/18   [provider]  potassium chloride (K-DUR,KLOR-CON) 10 MEQ tablet Take 10 mEq by mouth daily.    [provider]  ranitidine (ZANTAC) 150 MG tablet Take 300 mg by mouth daily as needed for heartburn.    [provider]  spironolactone (ALDACTONE) 25 MG tablet Take 25 mg by mouth daily.    [provider]  traZODone (DESYREL) 50 MG tablet Take 50 mg by mouth at bedtime as needed for sleep.  08/12/17   [provider]      VITAL SIGNS:  Blood pressure 100/65, pulse (!) 104, temperature 99.9 F (37.7 C), temperature source Oral, resp. rate 16, height 5' 7"  (1.702 m), weight 99.8 kg, SpO2 95 %.  PHYSICAL EXAMINATION:  GENERAL:  57 y.o.-year-old patient lying in the bed with no acute distress.  EYES: Pupils equal, round, reactive to light and accommodation. No scleral icterus. Extraocular muscles intact.  HEENT: Head atraumatic, normocephalic. Oropharynx and nasopharynx clear.  NECK:  Supple, no jugular venous distention. No thyroid enlargement, no tenderness.  LUNGS: Normal breath sounds bilaterally, no wheezing, rales,rhonchi or crepitation. No use of accessory muscles of respiration.  CARDIOVASCULAR: S1, S2 normal. No murmurs, rubs, or gallops.  ABDOMEN: Soft, nontender, nondistended. Bowel sounds  present. No organomegaly or mass.  EXTREMITIES: No pedal edema, cyanosis, or clubbing.  NEUROLOGIC: Cranial nerves II through XII are intact. Muscle strength 5/5 in all extremities. Sensation intact. Gait not checked.  PSYCHIATRIC: The patient is alert and oriented x 3.  SKIN: Right foot plantar aspect with undermined purulent ulcer 5 x 7 cm  LABORATORY PANEL:   CBC Recent Labs  Lab 03/30/18 1440  WBC 10.9*  HGB 10.7*  HCT 35.4*  PLT 353   ------------------------------------------------------------------------------------------------------------------  Chemistries  Recent Labs  Lab 03/30/18 1440  NA 129*  K 5.0  CL 90*  CO2 31  GLUCOSE 191*  BUN 16  CREATININE 0.86  CALCIUM 9.2  AST 22  ALT 17  ALKPHOS 114  BILITOT 0.8   ------------------------------------------------------------------------------------------------------------------  Cardiac Enzymes No results for input(s): TROPONINI in the last 168 hours. ------------------------------------------------------------------------------------------------------------------  RADIOLOGY:  Dg Chest Port 1 View  Result Date: 03/30/2018 CLINICAL DATA:  Fever for several days EXAM: PORTABLE CHEST 1 VIEW COMPARISON:  01/03/2018 FINDINGS: Cardiac shadow is enlarged. Aortic calcifications are again seen. No focal infiltrate or sizable effusion is seen. No bony abnormality is noted. IMPRESSION: No acute abnormality noted. Electronically Signed   By: Inez Catalina M.D.   On: 03/30/2018 15:05   Dg Foot Complete Right  Result Date: 03/30/2018 CLINICAL DATA:  57 y/o  M; 3 days of fever. Known right foot wound. EXAM: RIGHT FOOT COMPLETE - 3+ VIEW COMPARISON:  01/02/2018 right foot radiograph. 01/03/2018 right foot MRI. FINDINGS: Extensive diffuse bony erosive changes of the tarsometatarsal and intertarsal joints greatest laterally compatible with osteomyelitis. Associated interval disorganization and collapse of the midfoot. Extensive  surrounding soft tissue swelling with lateral soft tissue ulcer. IMPRESSION: Progressive extensive bony erosive changes of the tarsometatarsal and intertarsal joints greatest laterally compatible with osteomyelitis. Electronically Signed   By: Kristine Garbe M.D.   On: 03/30/2018 16:12    EKG:   Orders placed or performed during the hospital encounter of 03/30/18  . ED EKG 12-Lead  . ED EKG 12-Lead  . EKG 12-Lead  . EKG 12-Lead    IMPRESSION AND PLAN:    #Sepsis from right foot osteomyelitis Admit to MedSurg unit Patient met septic criteria at the time of admission with elevated lactic acid, leukocytosis, fever, tachycardia Zosyn and vancomycin were given in the emergency department Pancultures were obtained Continue antibiotics Zosyn and vancomycin Podiatry Dr. Vickki Muff and vascular surgery consult placed Patient is in fact scheduled to get right BKA by vascular surgery on Friday Pain management as needed  hold patient's home medication aspirin and Plavix IV fluids monitor lactic acid and procalcitonin levels  #Hyponatremia gentle hydration with IV fluids  #COPD no exacerbation Neb  treatments as needed patient continues to smoke counseled to quit smoking  #Diabetes mental sliding scale insulin  #Chronic systolic congestive heart failure with ejection fraction 30 to 35% Patient is not fluid overloaded Currently getting IV fluids for sepsis Hold home medications Lasix and spironolactone Resume home medications once med reconciliation is done   #Hyperlipidemia resume home medications after med reconciliation  #Tobacco abuse disorder Counseled patient to quit smoking for 5 minutes.  Patient  verbalized understanding of the plan.  Will provide him nicotine patch  DVT prophylaxis with SCDs   All the records are reviewed and case discussed with ED provider. Management plans discussed with the patient, family and they are in agreement.  CODE STATUS: fc , girl  fried Pamella Pert   TOTAL TIME TAKING CARE OF THIS PATIENT: 43  minutes.   Note: This dictation was prepared with Dragon dictation along with smaller phrase technology. Any transcriptional errors that result from this process are unintentional.  Nicholes Mango M.D on 03/30/2018 at 5:07 PM  Between 7am to 6pm - Pager - [phone]  After 6pm go to [email or web address] - password [email or web address] [facility]  [place] [facility]  Office  [phone]  CC: Primary care  physician; Clarisse Gouge, MD

## 2018-03-30 NOTE — Progress Notes (Signed)
Patient with worsening infection   Admitted for parenteral antibiotics   I will plan to move the right BKA up to Wednesday given his worsening condition  Full consult to follow

## 2018-03-30 NOTE — Consult Note (Signed)
Pharmacy Antibiotic Note  [name] [name] is a 57 y.o. male admitted on 03/30/2018 with cellulitis and diabetic foot infection.  Pharmacy has been consulted for Zosyn and vancomycin dosing.  Plan: Vancomycin 1000 mg once followed by 6 hour stacked dosing vancomycin 1500 mg IV every 12 hours.  Goal trough 15-20 mcg/mL. Will draw trough prior to the fifth dose.  Zosyn 3.375g IV q8h (4 hour infusion).  Height: 5\' 7"  (170.2 cm) Weight: 220 lb (99.8 kg) IBW/kg (Calculated) : 66.1  Temp (24hrs), Avg:99.9 F (37.7 C), Min:99.9 F (37.7 C), Max:99.9 F (37.7 C)  Recent Labs  Lab 03/30/18 1440  WBC 10.9*  CREATININE 0.86  LATICACIDVEN 2.3*    Estimated Creatinine Clearance: 109.3 mL/min (by C-G formula based on SCr of 0.86 mg/dL).    Allergies  Allergen Reactions  . Dulaglutide Anaphylaxis, Diarrhea and Hives  . Prednisone Rash    Antimicrobials this admission: Zosyn 10/21 >>  Vanco 10/21 >>   Dose adjustments this admission:   Microbiology results: 10/21 BCx: pending 10/21 UCx: pending   Sputum:    MRSA PCR:   Thank you for allowing pharmacy to be a part of this patient's care.  Forrest Moron, PharmD 03/30/2018 5:16 PM

## 2018-03-30 NOTE — ED Provider Notes (Signed)
[facility] Emergency Department Provider Note  ____________________________________________  Time seen: Approximately 3:21 PM  I have reviewed the triage vital signs and the nursing notes.   HISTORY  Chief Complaint Wound Infection   HPI [name] [name] is a 57 y.o. male history of CHF with EF 35%, diabetes and chronic diabetic foot ulcer scheduled for an amputation with Dr. Delana Meyer in 4 days who presents for evaluation of fever.  Patient reports 3 days of fever as high as 103F.  Is complaining of pain that is located in his right lower extremity which has been chronic and unchanged from baseline.  He also has redness and warmth of the right lower extremity which according to the patient is also unchanged from prior.  He denies changes in his chronic cough, shortness of breath or chest pain, abdominal pain, dysuria or hematuria, nausea, vomiting, diarrhea.   Past Medical History:  Diagnosis Date  . Arthritis   . Asthma   . Atherosclerosis   . BPH (benign prostatic hyperplasia)   . Carotid arterial disease ([place])   . Charcot's joint of foot, right   . Chronic combined systolic (congestive) and diastolic (congestive) heart failure ([place])    a. 02/2013 EF 50% by LV gram; b. 04/2017 Echo: EF 25-30%. diff HK. Gr2 DD. Mod dil LA/RV. PASP 31mmHg.  [name] COPD (chronic obstructive pulmonary disease) ([place])   . Coronary artery disease    a. 2013 S/P PCI of L[facility]);  b. 03/2013 PCI: RCA 90p ( 2.5 x 23 mm DES); c. 02/2014 Cath: patent RCA stent->Med Rx; d. 04/2017 Cath: LM nl, LAd 30ost/p, 73m/d, D1 80ost, LCX 90p/m, OM1 85, RCA 70/20p, 11m, 70d->Referred for CT Surg-felt to be poor candidate.  . Diabetic neuropathy ([place])   . Diabetic ulcer of right foot ([place])   . Hernia   . Hyperlipidemia   . Hypertension   . Ischemic cardiomyopathy    a. 04/2017 Echo: EF 25-30%; b. 08/2017 Cardiac MRI ([facility]): EF 19%, sev glob HK. RVEF 20%, mod BAE, triv MR, mild-mod TR. Basal lateral  subendocardial infarct (viable), inf/infsept, dist septal ischemia, basal to mid lat peri-infarct ischemia.  . Morbid obesity ([place])   . Neuropathy   . PAD (peripheral artery disease) ([place])    a. Followed by Dr. Lucky Cowboy; b. 08/2010 Periph Angio: RSFA 70-80p (6X60 self-expanding stent); c. 09/2010 Periph Angio: L SFA 100p (7x unknown length self-expanding stent); d. 06/2015 Periph Angio: R SFA short segment occlusion (6x12 self-expanding stent).  . Restless leg syndrome   . Sleep apnea   . Subclavian artery stenosis, right ([place])   . Syncope and collapse   . Tobacco use    a. 75+ yr hx - still smoking 1ppd, down from 2 ppd.  . Type II diabetes mellitus ([place])   . Varicose vein     Patient Active Problem List   Diagnosis Date Noted  . Abnormal MRI, shoulder (Right) 02/23/2018  . Abnormal MRI, cervical spine (2016) 02/23/2018  . DDD (degenerative disc disease), cervical 02/23/2018  . Cervical foraminal stenosis (Bilateral) 02/23/2018  . Cervical central spinal stenosis 02/23/2018  . Cervical facet hypertrophy 02/23/2018  . Arthralgia of acromioclavicular joint (Right) 02/23/2018  . Osteoarthritis of  AC (acromioclavicular) joint (Right) 02/23/2018  . Biceps tendinosis of shoulder (Right) 02/23/2018  . Tendinopathy of rotator cuff (Right) 02/23/2018  . Vitamin D deficiency 02/23/2018  . Atherosclerosis of native arteries of the extremities with ulceration ([place]) 02/15/2018  . Peripheral edema 02/05/2018  .  Status post peripherally inserted central catheter (PICC) central line placement 02/04/2018  . Chronic lower extremity pain (Primary Area of Pain) (Right) 01/29/2018  . Chronic shoulder pain [facility] Area of Pain) (Right) 01/29/2018  . Chronic foot pain (Secondary Area of Pain) (Right) 01/29/2018  . Chronic pain syndrome 01/29/2018  . Long term current use of opiate analgesic 01/29/2018  . Medication monitoring encounter 01/29/2018  . Disorder of skeletal system 01/29/2018  . Problems  influencing health status 01/29/2018  . AKI (acute kidney injury) ([place]) 01/03/2018  . NSTEMI (non-ST elevated myocardial infarction) ([place]) 11/21/2017  . Coronary artery disease of native artery of native heart with stable angina pectoris ([place]) 09/10/2017  . HLD (hyperlipidemia) 09/10/2017  . Angina pectoris ([place]) 09/08/2017  . Angina at rest [facility]) 08/13/2017  . Lymphedema 05/02/2017  . Chronic systolic HF (heart failure) ([place]) 04/23/2017  . Chest pain 07/11/2016  . Unstable angina ([place]) 07/11/2016  . Foot ulcer (Right)   . Stable angina (H[facility])   . Pressure injury of skin 05/06/2016  . Diabetic foot infection ([place]) 05/05/2016  . Type 2 diabetes mellitus with complication, with long-term current use of insulin ([place]) 01/26/2016  . Morbid obesity ([place]) 11/17/2014  . PAD (peripheral artery disease) ([place])   . Hypertension   . Tobacco use   . COPD (chronic obstructive pulmonary disease) ([place]) 05/19/2012  . Diabetic neuropathy ([place]) 05/19/2012    Past Surgical History:  Procedure Laterality Date  . ABDOMINAL AORTAGRAM N/A 06/23/2013   Procedure: ABDOMINAL Maxcine [place];  Surgeon: Wellington Hampshire, MD;  Location: [place] CATH LAB;  Service: Cardiovascular;  Laterality: N/A;  . APPENDECTOMY    . CARDIAC CATHETERIZATION  10/14   [facility]; X1 STENT PROXIMAL RCA  . CARDIAC CATHETERIZATION  02/2010   [facility]  . CARDIAC CATHETERIZATION  02/21/2014   [facility]  . CLAVICLE SURGERY    . HERNIA REPAIR    . IRRIGATION AND DEBRIDEMENT FOOT Right 01/04/2018   Procedure: IRRIGATION AND DEBRIDEMENT FOOT;  Surgeon: Samara Deist, DPM;  Location: [facility] ORS;  Service: Podiatry;  Laterality: Right;  . LEFT HEART CATH AND CORS/GRAFTS ANGIOGRAPHY N/A 04/28/2017   Procedure: LEFT HEART CATH AND CORONARY ANGIOGRAPHY;  Surgeon: Wellington Hampshire, MD;  Location: [place] CV LAB;  Service: Cardiovascular;  Laterality: N/A;  . LOWER EXTREMITY ANGIOGRAPHY Right 03/03/2018   Procedure: LOWER EXTREMITY ANGIOGRAPHY;  Surgeon: Katha Cabal, MD;  Location: [place] CV LAB;  Service: Cardiovascular;  Laterality: Right;  . PERIPHERAL ARTERIAL STENT GRAFT     x2 left/right  . PERIPHERAL VASCULAR BALLOON ANGIOPLASTY Right 01/06/2018   Procedure: PERIPHERAL VASCULAR BALLOON ANGIOPLASTY;  Surgeon: Katha Cabal, MD;  Location: [place] CV LAB;  Service: Cardiovascular;  Laterality: Right;  . PERIPHERAL VASCULAR CATHETERIZATION N/A 07/06/2015   Procedure: Abdominal Aortogram w/Lower Extremity;  Surgeon: Algernon Huxley, MD;  Location: [place] CV LAB;  Service: Cardiovascular;  Laterality: N/A;  . PERIPHERAL VASCULAR CATHETERIZATION  07/06/2015   Procedure: Lower Extremity Intervention;  Surgeon: Algernon Huxley, MD;  Location: [place] CV LAB;  Service: Cardiovascular;;    Prior to Admission medications   Medication Sig Start Date End Date Taking? Authorizing Provider  albuterol (PROVENTIL HFA;VENTOLIN HFA) 108 (90 BASE) MCG/ACT inhaler Inhale 2 puffs into the lungs every 6 (six) hours as needed for wheezing or shortness of breath. 04/15/13   Wellington Hampshire, MD  aspirin EC 81 MG tablet Take 1 tablet (81 mg total) by mouth daily. 05/11/15   Fletcher Anon,  Mertie Clause, MD  atorvastatin (LIPITOR) 80 MG tablet Take 80 mg by mouth daily.    [provider]  calcium carbonate (TUMS - DOSED IN MG ELEMENTAL CALCIUM) 500 MG chewable tablet Chew 4 tablets by mouth daily as needed for indigestion or heartburn.    [provider]  clopidogrel (PLAVIX) 75 MG tablet Take 75 mg by mouth daily.  08/22/17   [provider]  diphenhydrAMINE (BENADRYL) 25 MG tablet Take 25 mg by mouth daily as needed for allergies.    [provider]  ezetimibe (ZETIA) 10 MG tablet Take 10 mg by mouth daily.    [provider]  furosemide (LASIX) 40 MG tablet Take 40 mg by mouth 2 (two) times daily.  01/15/18   [provider]  gabapentin (NEURONTIN) 800 MG tablet Take 800 mg by mouth 4 (four) times daily.   08/14/17 08/14/18  [provider]  Insulin Glargine (LANTUS SOLOSTAR) 100 UNIT/ML Solostar Pen Inject 70 Units into the skin at bedtime. Patient not taking: Reported on 02/25/2018 12/23/17   Gladstone Lighter, MD  ipratropium-albuterol (DUONEB) 0.5-2.5 (3) MG/3ML SOLN Inhale 3 mLs into the lungs every 6 (six) hours as needed (wheezing/sob).     [provider]  metFORMIN (GLUCOPHAGE-XR) 500 MG 24 hr tablet Take 500 mg by mouth daily with breakfast.    [provider]  multivitamin-lutein (OCUVITE-LUTEIN) CAPS capsule Take 1 capsule by mouth daily. 01/09/18   Demetrios Loll, MD  naproxen sodium (ALEVE) 220 MG tablet Take 440 mg by mouth daily as needed (pain).    [provider]  nicotine (NICODERM CQ - DOSED IN MG/24 HOURS) 14 mg/24hr patch Place 1 patch (14 mg total) onto the skin daily. Patient not taking: Reported on 02/25/2018 11/23/17   Salary, Holly Bodily D, MD  nitroGLYCERIN (NITRODUR - DOSED IN MG/24 HR) 0.4 mg/hr patch Place 0.4 mg onto the skin daily.    [provider]  nitroGLYCERIN (NITROSTAT) 0.4 MG SL tablet Place 0.4 mg under the tongue every 5 (five) minutes as needed for chest pain.    [provider]  nutrition supplement, JUVEN, (JUVEN) PACK Take 1 packet by mouth 2 (two) times daily between meals. Patient not taking: Reported on 02/25/2018 01/09/18   Demetrios Loll, MD  Oxycodone HCl 10 MG TABS Take 10 mg by mouth every 6 (six) hours as needed (pain).  01/15/18   [provider]  potassium chloride (K-DUR,KLOR-CON) 10 MEQ tablet Take 10 mEq by mouth daily.    [provider]  ranitidine (ZANTAC) 150 MG tablet Take 300 mg by mouth daily as needed for heartburn.    [provider]  spironolactone (ALDACTONE) 25 MG tablet Take 25 mg by mouth daily.    [provider]  traZODone (DESYREL) 50 MG tablet Take 50 mg by mouth at bedtime as needed for sleep.  08/12/17   [provider]    Allergies Dulaglutide and  Prednisone  Family History  Problem Relation Age of Onset  . Heart attack Father   . Heart disease Father   . Hypertension Father   . Hyperlipidemia Father   . Hypertension Mother   . Hyperlipidemia Mother     Social History Social History   Tobacco Use  . Smoking status: Current Every Day Smoker    Packs/day: 1.00    Years: 41.00    Pack years: 41.00    Types: Cigarettes  . Smokeless tobacco: Never Used  Substance Use Topics  . Alcohol  use: No  . Drug use: No    Review of Systems  Constitutional: Negative for fever. Eyes: Negative for visual changes. ENT: Negative for sore throat. Neck: No neck pain  Cardiovascular: Negative for chest pain. Respiratory: Negative for shortness of breath. Gastrointestinal: Negative for abdominal pain, vomiting or diarrhea. Genitourinary: Negative for dysuria. Musculoskeletal: Negative for back pain. + R foot wound and R leg swelling, redness Skin: Negative for rash. Neurological: Negative for headaches, weakness or numbness. Psych: No SI or HI  ____________________________________________   PHYSICAL EXAM:  VITAL SIGNS: ED Triage Vitals  Enc Vitals Group     BP 03/30/18 1435 120/75     Pulse Rate 03/30/18 1435 (!) 135     Resp 03/30/18 1435 18     Temp 03/30/18 1435 99.9 F (37.7 C)     Temp Source 03/30/18 1435 Oral     SpO2 03/30/18 1435 92 %     Weight 03/30/18 1437 220 lb (99.8 kg)     Height 03/30/18 1437 5\' 7"  (1.702 m)     Head Circumference --      Peak Flow --      Pain Score 03/30/18 1436 9     Pain Loc --      Pain Edu? --      Excl. in [place]? --     Constitutional: Alert and oriented. Well appearing and in no apparent distress. HEENT:      Head: Normocephalic and atraumatic.         Eyes: Conjunctivae are normal. Sclera is non-icteric.       Mouth/Throat: Mucous membranes are moist.       Neck: Supple with no signs of meningismus. Cardiovascular: Tachycardic with regular rhythm. No murmurs, gallops, or  rubs. 2+ symmetrical distal pulses are present in all extremities. No JVD. Respiratory: Normal respiratory effort. Lungs are clear to auscultation bilaterally. No wheezes, crackles, or rhonchi.  Gastrointestinal: Soft, non tender, and non distended with positive bowel sounds. No rebound or guarding. Musculoskeletal: There is a large, deep ulcer located on the sole of the R foot with purulent discharge and erythema and warmth of the RLE. No necrosis or crepitus Neurologic: Normal speech and language. Face is symmetric. Moving all extremities. No gross focal neurologic deficits are appreciated. Skin: Skin is warm, dry and intact. No rash noted. Psychiatric: Mood and affect are normal. Speech and behavior are normal.  ____________________________________________   LABS (all labs ordered are listed, but only abnormal results are displayed)  Labs Reviewed  COMPREHENSIVE METABOLIC PANEL - Abnormal; Notable for the following components:      Result Value   Sodium 129 (*)    Chloride 90 (*)    Glucose, Bld 191 (*)    Albumin 2.9 (*)    All other components within normal limits  CBC WITH DIFFERENTIAL/PLATELET - Abnormal; Notable for the following components:   WBC 10.9 (*)    Hemoglobin 10.7 (*)    HCT 35.4 (*)    MCV 69.1 (*)    MCH 20.9 (*)    RDW 21.1 (*)    Neutro Abs 9.4 (*)    All other components within normal limits  LACTIC ACID, PLASMA - Abnormal; Notable for the following components:   Lactic Acid, Venous 2.3 (*)    All other components within normal limits  CULTURE, BLOOD (ROUTINE X 2)  CULTURE, BLOOD (ROUTINE X 2)  URINE CULTURE  URINALYSIS, ROUTINE W REFLEX MICROSCOPIC  LACTIC ACID, PLASMA   ____________________________________________  EKG  ED ECG REPORT I, Rudene Re, the attending physician, personally viewed and interpreted this ECG.  Sinus tachycardia, rate of 131, right bundle branch block, normal QTC, right axis deviation, no ST elevations.  Unchanged  from prior ____________________________________________  RADIOLOGY  I have personally reviewed the images performed during this visit and I agree with the Radiologist's read.   Interpretation by Radiologist:  Dg Chest Port 1 View  Result Date: 03/30/2018 CLINICAL DATA:  Fever for several days EXAM: PORTABLE CHEST 1 VIEW COMPARISON:  01/03/2018 FINDINGS: Cardiac shadow is enlarged. Aortic calcifications are again seen. No focal infiltrate or sizable effusion is seen. No bony abnormality is noted. IMPRESSION: No acute abnormality noted. Electronically Signed   By: Inez Catalina M.D.   On: 03/30/2018 15:05   Dg Foot Complete Right  Result Date: 03/30/2018 CLINICAL DATA:  57 y/o  M; 3 days of fever. Known right foot wound. EXAM: RIGHT FOOT COMPLETE - 3+ VIEW COMPARISON:  01/02/2018 right foot radiograph. 01/03/2018 right foot MRI. FINDINGS: Extensive diffuse bony erosive changes of the tarsometatarsal and intertarsal joints greatest laterally compatible with osteomyelitis. Associated interval disorganization and collapse of the midfoot. Extensive surrounding soft tissue swelling with lateral soft tissue ulcer. IMPRESSION: Progressive extensive bony erosive changes of the tarsometatarsal and intertarsal joints greatest laterally compatible with osteomyelitis. Electronically Signed   By: Kristine Garbe M.D.   On: 03/30/2018 16:12     ____________________________________________   PROCEDURES  Procedure(s) performed: None Procedures Critical Care performed: yes  CRITICAL CARE Performed by: Rudene Re  ?  Total critical care time: 35 min  Critical care time was exclusive of separately billable procedures and treating other patients.  Critical care was necessary to treat or prevent imminent or life-threatening deterioration.  Critical care was time spent personally by me on the following activities: development of treatment plan with patient and/or surrogate as well as  nursing, discussions with consultants, evaluation of patient's response to treatment, examination of patient, obtaining history from patient or surrogate, ordering and performing treatments and interventions, ordering and review of laboratory studies, ordering and review of radiographic studies, pulse oximetry and re-evaluation of patient's condition.  ____________________________________________   INITIAL IMPRESSION / ASSESSMENT AND PLAN / ED COURSE   57 y.o. male history of CHF with EF 35%, diabetes and chronic diabetic foot ulcer scheduled for an amputation with Dr. Delana Meyer in 4 days who presents for evaluation of fever.  Patient meets sepsis criteria on arrival with fever and tachycardia.  He has purulent discharge from his foot ulcer with cellulitis of the right lower extremity.  Patient was started on sepsis protocol.  Covered with broad-spectrum antibiotics, IV fluids and Tylenol. Lactic elevated at 2.3. XR of the foot showed osteomyelitis. 1L NS given, gentle hydration due to CHF with reduced EF. Patient will be admitted to Hospitalist.      As part of my medical decision making, I reviewed the following data within the [place] notes reviewed and incorporated, Labs reviewed , EKG interpreted , Old EKG reviewed, Old chart reviewed, Discussed with admitting physician , Notes from prior ED visits and [name] Controlled Substance Database    Pertinent labs & imaging results that were available during my care of the patient were reviewed by me and considered in my medical decision making (see chart for details).    ____________________________________________   FINAL CLINICAL IMPRESSION(S) / ED DIAGNOSES  Final diagnoses:  Sepsis, due to unspecified organism, unspecified whether acute organ dysfunction present (  [place])  Diabetic ulcer of right foot associated with type 2 diabetes mellitus, unspecified part of foot, unspecified ulcer stage ([place])  Cellulitis of right  lower extremity  Osteomyelitis of foot, acute ([place])      NEW MEDICATIONS STARTED DURING THIS VISIT:  ED Discharge Orders    None       Note:  This document was prepared using Dragon voice recognition software and may include unintentional dictation errors.    Alfred Levins, [place], MD 03/30/18 1630

## 2018-03-30 NOTE — Progress Notes (Signed)
Family Meeting Note  Advance Directive:yes  Today a meeting took place with the Patient.     The following clinical team members were present during this meeting:MD  The following were discussed:Patient's diagnosis: Sepsis, osteomyelitis, hyponatremia, diabetes metas, hypertension, hyperlipidemia, chronic systolic and S2 heart failure, chronic COPD, continues to smoke, treatment plan of care discussed in detail with the patient.  He verbalized understanding of the plan.    Patient's progosis: Unable to determine and Goals for treatment: Full Code  Additional follow-up to be provided: Hospitalist, vascular surgery, podiatry  Time spent during discussion:17 min  [name] Mango, MD

## 2018-03-30 NOTE — Progress Notes (Signed)
CODE SEPSIS - PHARMACY COMMUNICATION  **Broad Spectrum Antibiotics should be administered within 1 hour of Sepsis diagnosis**  Time Code Sepsis Called/Page Received: 1435  Antibiotics Ordered: Zosyn and Vanco  Time of 1st antibiotic administration: 8295  Additional action taken by pharmacy: n/a  If necessary, Name of Provider/Nurse Contacted: n/a    Forrest Moron ,PharmD Clinical Pharmacist  03/30/2018  2:41 PM

## 2018-03-31 ENCOUNTER — Inpatient Hospital Stay: Admission: RE | Admit: 2018-03-31 | Payer: Medicaid Other | Source: Ambulatory Visit

## 2018-03-31 LAB — COMPREHENSIVE METABOLIC PANEL
ALT: 18 U/L (ref 0–44)
AST: 21 U/L (ref 15–41)
Albumin: 2.3 g/dL — ABNORMAL LOW (ref 3.5–5.0)
Alkaline Phosphatase: 97 U/L (ref 38–126)
Anion gap: 12 (ref 5–15)
BUN: 17 mg/dL (ref 6–20)
CO2: 27 mmol/L (ref 22–32)
Calcium: 8.6 mg/dL — ABNORMAL LOW (ref 8.9–10.3)
Chloride: 95 mmol/L — ABNORMAL LOW (ref 98–111)
Creatinine, Ser: 0.99 mg/dL (ref 0.61–1.24)
GFR calc Af Amer: >60 mL/min (ref >60)
GFR calc non Af Amer: >60 mL/min (ref >60)
Glucose, Bld: 153 mg/dL — ABNORMAL HIGH (ref 70–99)
Potassium: 5 mmol/L (ref 3.5–5.1)
Sodium: 134 mmol/L — ABNORMAL LOW (ref 135–145)
Total Bilirubin: 0.7 mg/dL (ref 0.3–1.2)
Total Protein: 6.8 g/dL (ref 6.5–8.1)

## 2018-03-31 LAB — URINALYSIS, ROUTINE W REFLEX MICROSCOPIC
Bacteria, UA: NONE SEEN
Bilirubin Urine: NEGATIVE
Glucose, UA: NEGATIVE mg/dL
Ketones, ur: NEGATIVE mg/dL
Leukocytes, UA: NEGATIVE
Nitrite: NEGATIVE
Protein, ur: 30 mg/dL — AB
Specific Gravity, Urine: 1.01 (ref 1.005–1.030)
pH: 6 (ref 5.0–8.0)

## 2018-03-31 LAB — CBC
HCT: 30.1 % — ABNORMAL LOW (ref 39.0–52.0)
Hemoglobin: 9.2 g/dL — ABNORMAL LOW (ref 13.0–17.0)
MCH: 21.3 pg — ABNORMAL LOW (ref 26.0–34.0)
MCHC: 30.6 g/dL (ref 30.0–36.0)
MCV: 69.7 fL — ABNORMAL LOW (ref 80.0–100.0)
Platelets: 291 10*3/uL (ref 150–400)
RBC: 4.32 MIL/uL (ref 4.22–5.81)
RDW: 20.6 % — ABNORMAL HIGH (ref 11.5–15.5)
WBC: 7.7 10*3/uL (ref 4.0–10.5)
nRBC: 0 % (ref 0.0–0.2)

## 2018-03-31 LAB — MRSA PCR SCREENING: MRSA by PCR: NEGATIVE

## 2018-03-31 LAB — GLUCOSE, CAPILLARY
Glucose-Capillary: 187 mg/dL — ABNORMAL HIGH (ref 70–99)
Glucose-Capillary: 249 mg/dL — ABNORMAL HIGH (ref 70–99)
Glucose-Capillary: 272 mg/dL — ABNORMAL HIGH (ref 70–99)

## 2018-03-31 LAB — PROCALCITONIN: Procalcitonin: 0.1 ng/mL

## 2018-03-31 MED ORDER — VANCOMYCIN HCL 10 G IV SOLR
1250.0000 mg | Freq: Three times a day (TID) | INTRAVENOUS | Status: DC
  Administered 2018-03-31 – 2018-04-01 (×4): 1250 mg via INTRAVENOUS
  Filled 2018-03-31 (×5): qty 1250

## 2018-03-31 MED ORDER — CEFAZOLIN SODIUM-DEXTROSE 2-4 GM/100ML-% IV SOLN: 2.0000 g | INTRAVENOUS | Status: DC

## 2018-03-31 MED ORDER — ENSURE ENLIVE PO LIQD
237.0000 mL | Freq: Two times a day (BID) | ORAL | Status: DC
  Administered 2018-03-31 – 2018-04-07 (×5): 237 mL via ORAL

## 2018-03-31 MED ORDER — IPRATROPIUM-ALBUTEROL 0.5-2.5 (3) MG/3ML IN SOLN
3.0000 mL | Freq: Four times a day (QID) | RESPIRATORY_TRACT | Status: DC | PRN
  Administered 2018-03-31 – 2018-04-04 (×3): 3 mL via RESPIRATORY_TRACT
  Filled 2018-03-31 (×3): qty 3

## 2018-03-31 MED ORDER — INSULIN ASPART 100 UNIT/ML ~~LOC~~ SOLN
0.0000 [IU] | Freq: Every day | SUBCUTANEOUS | Status: DC
  Administered 2018-03-31: 3 [IU] via SUBCUTANEOUS
  Administered 2018-04-01: 14 [IU] via SUBCUTANEOUS
  Administered 2018-04-05: 2 [IU] via SUBCUTANEOUS
  Filled 2018-03-31 (×3): qty 1

## 2018-03-31 MED ORDER — INSULIN ASPART 100 UNIT/ML ~~LOC~~ SOLN
0.0000 [IU] | Freq: Three times a day (TID) | SUBCUTANEOUS | Status: DC
  Administered 2018-03-31: 3 [IU] via SUBCUTANEOUS
  Administered 2018-03-31: 2 [IU] via SUBCUTANEOUS
  Administered 2018-04-01: 1 [IU] via SUBCUTANEOUS
  Administered 2018-04-01: 5 [IU] via SUBCUTANEOUS
  Administered 2018-04-02: 2 [IU] via SUBCUTANEOUS
  Administered 2018-04-02: 9 [IU] via SUBCUTANEOUS
  Administered 2018-04-02: 5 [IU] via SUBCUTANEOUS
  Administered 2018-04-03 – 2018-04-04 (×4): 2 [IU] via SUBCUTANEOUS
  Administered 2018-04-04: 3 [IU] via SUBCUTANEOUS
  Administered 2018-04-04 – 2018-04-05 (×2): 2 [IU] via SUBCUTANEOUS
  Administered 2018-04-06: 5 [IU] via SUBCUTANEOUS
  Administered 2018-04-06: 1 [IU] via SUBCUTANEOUS
  Administered 2018-04-06: 2 [IU] via SUBCUTANEOUS
  Administered 2018-04-07: 1 [IU] via SUBCUTANEOUS
  Administered 2018-04-07: 2 [IU] via SUBCUTANEOUS
  Filled 2018-03-31 (×20): qty 1

## 2018-03-31 NOTE — Progress Notes (Signed)
[facility] [place] NAME: [name] [name]    MR#:  [id number]  DATE OF BIRTH:  1961/05/28  SUBJECTIVE:   Patient presented to the hospital due to right diabetic foot ulcer.  Patient has poor circulation and PVD in the right lower extremity and plan is for right-sided BKA later this week.  Patient complaining of pain in that right foot and is anxious about the surgery tomorrow.  REVIEW OF SYSTEMS:    Review of Systems  Constitutional: Negative for chills and fever.  HENT: Negative for congestion and tinnitus.   Eyes: Negative for blurred vision and double vision.  Respiratory: Negative for cough, shortness of breath and wheezing.   Cardiovascular: Negative for chest pain, orthopnea and PND.  Gastrointestinal: Negative for abdominal pain, diarrhea, nausea and vomiting.  Genitourinary: Negative for dysuria and hematuria.  Musculoskeletal: Positive for joint pain (Right Foot pain. ).  Neurological: Negative for dizziness, sensory change and focal weakness.  All other systems reviewed and are negative.   Nutrition: Heart healthy/Carb control Tolerating Diet: Yes Tolerating PT: Await Eval Post-op.    DRUG ALLERGIES:   Allergies  Allergen Reactions  . Dulaglutide Anaphylaxis, Diarrhea and Hives  . Prednisone Rash    VITALS:  Blood pressure 125/82, pulse (!) 102, temperature 97.8 F (36.6 C), temperature source Oral, resp. rate 20, height 5\' 7"  (1.702 m), weight 99.8 kg, SpO2 97 %.  PHYSICAL EXAMINATION:   Physical Exam  GENERAL:  57 y.o.-year-old patient lying in bed in no acute distress.  EYES: Pupils equal, round, reactive to light and accommodation. No scleral icterus. Extraocular muscles intact.  HEENT: Head atraumatic, normocephalic. Oropharynx and nasopharynx clear.  NECK:  Supple, no jugular venous distention. No thyroid enlargement, no tenderness.  LUNGS: Normal breath sounds bilaterally, no wheezing, rales, rhonchi. No  use of accessory muscles of respiration.  CARDIOVASCULAR: S1, S2 normal. No murmurs, rubs, or gallops.  ABDOMEN: Soft, nontender, nondistended. Bowel sounds present. No organomegaly or mass.  EXTREMITIES: No cyanosis, clubbing or edema b/l.   Right foot dressing in place with whole foot covered.  No drainage/bleeding. NEUROLOGIC: Cranial nerves II through XII are intact. No focal Motor or sensory deficits b/l.   PSYCHIATRIC: The patient is alert and oriented x 3.  SKIN: No obvious rash, lesion, or ulcer.    LABORATORY PANEL:   CBC Recent Labs  Lab 03/31/18 0606  WBC 7.7  HGB 9.2*  HCT 30.1*  PLT 291   ------------------------------------------------------------------------------------------------------------------  Chemistries  Recent Labs  Lab 03/31/18 0606  NA 134*  K 5.0  CL 95*  CO2 27  GLUCOSE 153*  BUN 17  CREATININE 0.99  CALCIUM 8.6*  AST 21  ALT 18  ALKPHOS 97  BILITOT 0.7   ------------------------------------------------------------------------------------------------------------------  Cardiac Enzymes No results for input(s): TROPONINI in the last 168 hours. ------------------------------------------------------------------------------------------------------------------  RADIOLOGY:  Dg Chest Port 1 View  Result Date: 03/30/2018 CLINICAL DATA:  Fever for several days EXAM: PORTABLE CHEST 1 VIEW COMPARISON:  01/03/2018 FINDINGS: Cardiac shadow is enlarged. Aortic calcifications are again seen. No focal infiltrate or sizable effusion is seen. No bony abnormality is noted. IMPRESSION: No acute abnormality noted. Electronically Signed   By: Inez Catalina M.D.   On: 03/30/2018 15:05   Dg Foot Complete Right  Result Date: 03/30/2018 CLINICAL DATA:  57 y/o  M; 3 days of fever. Known right foot wound. EXAM: RIGHT FOOT COMPLETE - 3+ VIEW COMPARISON:  01/02/2018 right foot  radiograph. 01/03/2018 right foot MRI. FINDINGS: Extensive diffuse bony erosive changes of  the tarsometatarsal and intertarsal joints greatest laterally compatible with osteomyelitis. Associated interval disorganization and collapse of the midfoot. Extensive surrounding soft tissue swelling with lateral soft tissue ulcer. IMPRESSION: Progressive extensive bony erosive changes of the tarsometatarsal and intertarsal joints greatest laterally compatible with osteomyelitis. Electronically Signed   By: Kristine Garbe M.D.   On: 03/30/2018 16:12     ASSESSMENT AND PLAN:   57 yo male w/ hx of DM, HTN, hyperlipidemia, morbid obesity, ischemic cardiomyopathy, hypertension, COPD, chronic combined systolic diastolic CHF who presents to the hospital due to right foot pain noted to have a right diabetic foot ulcer.  1.  Right diabetic foot ulcer/osteomyelitis- patient has had this for quite a while now presented with worsening fever and pain. - Continue broad-spectrum IV antibiotics with vancomycin, Zosyn. - Plan is for right sided BKA tomorrow.  Appreciate podiatry and vascular surgery input.  2.  Diabetes type 2 without complication- continue sliding scale insulin.  3.  Tobacco abuse-continue nicotine patch.  4.  Anxiety-continue as needed Xanax.  5.  Hyperlipidemia-continue atorvastatin.  6.  Neuropathy secondary to diabetes-continue gabapentin.     All the records are reviewed and case discussed with Care Management/Social Worker. Management plans discussed with the patient, family and they are in agreement.  CODE STATUS: Full code  DVT Prophylaxis: Ted's and SCD's.   TOTAL TIME TAKING CARE OF THIS PATIENT: 30 minutes.   POSSIBLE D/C IN 2-3 DAYS, DEPENDING ON CLINICAL CONDITION.   Henreitta Leber M.D on 03/31/2018 at 1:12 PM  Between 7am to 6pm - Pager - [phone]  After 6pm go to [email or web address] - [facility]  Office  [phone]  CC: Primary care physician; Clarisse Gouge, MD

## 2018-03-31 NOTE — Progress Notes (Signed)
Inpatient Diabetes Program Recommendations  AACE/ADA: New Consensus Statement on Inpatient Glycemic Control (2015)  Target Ranges:  Prepandial:   less than 140 mg/dL      Peak postprandial:   less than 180 mg/dL (1-2 hours)      Critically ill patients:  140 - 180 mg/dL   Results for [name] [name], [name] [name] (MRN [id number]) as of 03/31/2018 10:00  Ref. Range 03/30/2018 14:40 03/30/2018 19:03 03/31/2018 06:06  Glucose Latest Ref Range: 70 - 99 mg/dL 191 (H) 284 (H) 153 (H)    Admit: Sepsis from right foot osteomyelitis  History: DM, CHF, COPD  Home DM Meds: Lantus 70 units QHS (Pt NOT taking)            Metformin 500 mg Daily  Current Orders: None yet     Per MD notes, plan to move R BKA surgery to Wednesday.    MD- Patient with History of Diabetes.  No orders for CBGs or Insulin at this time.  Please consider placing orders for Novolog Moderate Correction Scale/ SSI (0-15 units) TID AC + HS      --Will follow patient during hospitalization--  Wyn Quaker RN, MSN, CDE Diabetes Coordinator Inpatient Glycemic Control Team Team Pager: [phone] (8a-5p)

## 2018-03-31 NOTE — Consult Note (Signed)
Pharmacy Antibiotic Note  [name] [name] is a 57 y.o. male admitted on 03/30/2018 with cellulitis and diabetic foot infection.  Pharmacy has been consulted for Zosyn and vancomycin dosing. He has been treated previously with vancomycin at this hospital but his renal function was impaired throughout the visit. On admission 10/21 his SCr was elevated above his baseline and is now corrected. He has received the following vancomycin doses: 1000mg  10/21 1500 and 1500mg  10/22 0233. He is scheduled for a right BKA 10/23  Plan: 1) change vancomycin dose to 1250 mg IV every 8 hours.    Goal trough 15-20 mcg/mL  K 0.102  Vd 70L  T1/2: 6.8h  Calculated concentrations at steady-state: 30.3/14.8 mcg/mL  Will draw trough prior to the 4th dose.  2) continue Zosyn 3.375g IV q8h (4 hour infusion).  Height: 5\' 7"  (170.2 cm) Weight: 220 lb (99.8 kg) IBW/kg (Calculated) : 66.1  Temp (24hrs), Avg:98.4 F (36.9 C), Min:97.4 F (36.3 C), Max:99.9 F (37.7 C)  Recent Labs  Lab 03/30/18 1440 03/30/18 1727 03/30/18 1903 03/31/18 0606  WBC 10.9*  --  10.3 7.7  CREATININE 0.86  --  1.27* 0.99  LATICACIDVEN 2.3* 1.7 1.5  --     Estimated Creatinine Clearance: 94.9 mL/min (by C-G formula based on SCr of 0.99 mg/dL).    Allergies  Allergen Reactions  . Dulaglutide Anaphylaxis, Diarrhea and Hives  . Prednisone Rash    Antimicrobials this admission: Zosyn 10/21 >>  Vanco 10/21 >>   Dose adjustments this admission: 10/22: vancomycin 1500mg  q12h--->1250mg  q8h  Microbiology results: 10/21 BCx: pending 10/21 UCx: pending   Thank you for allowing pharmacy to be a part of this patient's care.  Dallie Piles, PharmD 03/31/2018 8:49 AM

## 2018-04-01 ENCOUNTER — Inpatient Hospital Stay: Payer: Medicaid Other | Admitting: Certified Registered"

## 2018-04-01 ENCOUNTER — Inpatient Hospital Stay: Admission: RE | Admit: 2018-04-01 | Payer: Medicaid Other | Source: Ambulatory Visit | Admitting: Vascular Surgery

## 2018-04-01 ENCOUNTER — Encounter
Admission: EM | Disposition: A | Discharge status: Home Health | Payer: Self-pay | Source: Home / Self Care | Attending: Internal Medicine

## 2018-04-01 DIAGNOSIS — M869 Osteomyelitis, unspecified: Secondary | ICD-10-CM

## 2018-04-01 HISTORY — PX: AMPUTATION: SHX166

## 2018-04-01 LAB — APTT: aPTT: 25 seconds (ref 24–36)

## 2018-04-01 LAB — CBC WITH DIFFERENTIAL/PLATELET
Abs Immature Granulocytes: 0.03 10*3/uL (ref 0.00–0.07)
Basophils Absolute: 0 10*3/uL (ref 0.0–0.1)
Basophils Relative: 0 %
Eosinophils Absolute: 0.1 10*3/uL (ref 0.0–0.5)
Eosinophils Relative: 1 %
HCT: 30.7 % — ABNORMAL LOW (ref 39.0–52.0)
Hemoglobin: 9.2 g/dL — ABNORMAL LOW (ref 13.0–17.0)
Immature Granulocytes: 0 %
Lymphocytes Relative: 14 %
Lymphs Abs: 1.1 10*3/uL (ref 0.7–4.0)
MCH: 21 pg — ABNORMAL LOW (ref 26.0–34.0)
MCHC: 30 g/dL (ref 30.0–36.0)
MCV: 70.1 fL — ABNORMAL LOW (ref 80.0–100.0)
Monocytes Absolute: 0.6 10*3/uL (ref 0.1–1.0)
Monocytes Relative: 8 %
Neutro Abs: 5.7 10*3/uL (ref 1.7–7.7)
Neutrophils Relative %: 77 %
Platelets: 304 10*3/uL (ref 150–400)
RBC: 4.38 MIL/uL (ref 4.22–5.81)
RDW: 20.6 % — ABNORMAL HIGH (ref 11.5–15.5)
WBC: 7.5 10*3/uL (ref 4.0–10.5)
nRBC: 0 % (ref 0.0–0.2)

## 2018-04-01 LAB — GLUCOSE, CAPILLARY
Glucose-Capillary: 124 mg/dL — ABNORMAL HIGH (ref 70–99)
Glucose-Capillary: 132 mg/dL — ABNORMAL HIGH (ref 70–99)
Glucose-Capillary: 192 mg/dL — ABNORMAL HIGH (ref 70–99)
Glucose-Capillary: 260 mg/dL — ABNORMAL HIGH (ref 70–99)
Glucose-Capillary: 437 mg/dL — ABNORMAL HIGH (ref 70–99)

## 2018-04-01 LAB — BASIC METABOLIC PANEL
Anion gap: 9 (ref 5–15)
BUN: 10 mg/dL (ref 6–20)
CO2: 30 mmol/L (ref 22–32)
Calcium: 8.8 mg/dL — ABNORMAL LOW (ref 8.9–10.3)
Chloride: 99 mmol/L (ref 98–111)
Creatinine, Ser: 0.89 mg/dL (ref 0.61–1.24)
GFR calc Af Amer: >60 mL/min (ref >60)
GFR calc non Af Amer: >60 mL/min (ref >60)
Glucose, Bld: 135 mg/dL — ABNORMAL HIGH (ref 70–99)
Potassium: 4.7 mmol/L (ref 3.5–5.1)
Sodium: 138 mmol/L (ref 135–145)

## 2018-04-01 LAB — URINE CULTURE: Culture: NO GROWTH

## 2018-04-01 LAB — TYPE AND SCREEN
ABO/RH(D): O NEG
Antibody Screen: NEGATIVE

## 2018-04-01 LAB — PROCALCITONIN: Procalcitonin: <0.1 ng/mL

## 2018-04-01 LAB — PROTIME-INR
INR: 1.13
Prothrombin Time: 14.4 seconds (ref 11.4–15.2)

## 2018-04-01 LAB — VANCOMYCIN, TROUGH: Vancomycin Tr: 33 ug/mL (ref 15–20)

## 2018-04-01 SURGERY — AMPUTATION BELOW KNEE
Anesthesia: General | Laterality: Right

## 2018-04-01 MED ORDER — HYDROMORPHONE HCL 1 MG/ML IJ SOLN
INTRAMUSCULAR | Status: AC
  Administered 2018-04-01: 0.5 mg via INTRAVENOUS
  Filled 2018-04-01: qty 1

## 2018-04-01 MED ORDER — OXYCODONE HCL 5 MG PO TABS
5.0000 mg | ORAL_TABLET | Freq: Once | ORAL | Status: AC | PRN
  Administered 2018-04-01: 5 mg via ORAL

## 2018-04-01 MED ORDER — INSULIN ASPART 100 UNIT/ML ~~LOC~~ SOLN: 9.0000 [IU] | Freq: Once | SUBCUTANEOUS | Status: AC

## 2018-04-01 MED ORDER — FENTANYL CITRATE (PF) 100 MCG/2ML IJ SOLN
INTRAMUSCULAR | Status: AC
  Administered 2018-04-01: 50 ug via INTRAVENOUS
  Filled 2018-04-01: qty 2

## 2018-04-01 MED ORDER — SUGAMMADEX SODIUM 200 MG/2ML IV SOLN
INTRAVENOUS | Status: DC | PRN
  Administered 2018-04-01: 200 mg via INTRAVENOUS

## 2018-04-01 MED ORDER — SODIUM CHLORIDE 0.9 % IV SOLN
INTRAVENOUS | Status: DC | PRN
  Administered 2018-04-01: 13:00:00 via INTRAVENOUS

## 2018-04-01 MED ORDER — ESMOLOL HCL 100 MG/10ML IV SOLN
INTRAVENOUS | Status: DC | PRN
  Administered 2018-04-01 (×2): 50 mg via INTRAVENOUS

## 2018-04-01 MED ORDER — IPRATROPIUM-ALBUTEROL 0.5-2.5 (3) MG/3ML IN SOLN
RESPIRATORY_TRACT | Status: AC
  Administered 2018-04-01: 3 mL via RESPIRATORY_TRACT
  Filled 2018-04-01: qty 3

## 2018-04-01 MED ORDER — IPRATROPIUM-ALBUTEROL 0.5-2.5 (3) MG/3ML IN SOLN
3.0000 mL | Freq: Once | RESPIRATORY_TRACT | Status: AC
  Administered 2018-04-01: 3 mL via RESPIRATORY_TRACT

## 2018-04-01 MED ORDER — VANCOMYCIN HCL 10 G IV SOLR: 1500.0000 mg | INTRAVENOUS | Status: DC

## 2018-04-01 MED ORDER — OXYCODONE HCL 5 MG/5ML PO SOLN: 5.0000 mg | Freq: Once | ORAL | Status: AC | PRN

## 2018-04-01 MED ORDER — FENTANYL CITRATE (PF) 100 MCG/2ML IJ SOLN
INTRAMUSCULAR | Status: DC | PRN
  Administered 2018-04-01 (×5): 50 ug via INTRAVENOUS

## 2018-04-01 MED ORDER — FENTANYL CITRATE (PF) 100 MCG/2ML IJ SOLN
25.0000 ug | INTRAMUSCULAR | Status: DC | PRN
  Administered 2018-04-01 (×3): 50 ug via INTRAVENOUS

## 2018-04-01 MED ORDER — ASPIRIN EC 81 MG PO TBEC
81.0000 mg | DELAYED_RELEASE_TABLET | Freq: Every day | ORAL | Status: DC
  Administered 2018-04-01 – 2018-04-07 (×7): 81 mg via ORAL
  Filled 2018-04-01 (×7): qty 1

## 2018-04-01 MED ORDER — INSULIN GLARGINE 100 UNIT/ML ~~LOC~~ SOLN
15.0000 [IU] | Freq: Every day | SUBCUTANEOUS | Status: DC
  Administered 2018-04-01: 15 [IU] via SUBCUTANEOUS
  Filled 2018-04-01 (×2): qty 0.15

## 2018-04-01 MED ORDER — MEPERIDINE HCL 50 MG/ML IJ SOLN: 6.2500 mg | INTRAMUSCULAR | Status: DC | PRN

## 2018-04-01 MED ORDER — SUCCINYLCHOLINE CHLORIDE 20 MG/ML IJ SOLN
INTRAMUSCULAR | Status: DC | PRN
  Administered 2018-04-01: 100 mg via INTRAVENOUS

## 2018-04-01 MED ORDER — PROPOFOL 10 MG/ML IV BOLUS
INTRAVENOUS | Status: DC | PRN
  Administered 2018-04-01: 100 mg via INTRAVENOUS

## 2018-04-01 MED ORDER — FUROSEMIDE 10 MG/ML IJ SOLN
INTRAMUSCULAR | Status: AC
  Filled 2018-04-01: qty 2

## 2018-04-01 MED ORDER — ALBUTEROL SULFATE (2.5 MG/3ML) 0.083% IN NEBU
2.5000 mg | INHALATION_SOLUTION | Freq: Four times a day (QID) | RESPIRATORY_TRACT | Status: DC | PRN
  Administered 2018-04-04: 2.5 mg via RESPIRATORY_TRACT
  Filled 2018-04-01: qty 3

## 2018-04-01 MED ORDER — HYDROMORPHONE HCL 1 MG/ML IJ SOLN
0.5000 mg | INTRAMUSCULAR | Status: AC | PRN
  Administered 2018-04-01 (×4): 0.5 mg via INTRAVENOUS

## 2018-04-01 MED ORDER — SPIRONOLACTONE 25 MG PO TABS
25.0000 mg | ORAL_TABLET | Freq: Every day | ORAL | Status: DC
  Administered 2018-04-01 – 2018-04-04 (×4): 25 mg via ORAL
  Filled 2018-04-01 (×4): qty 1

## 2018-04-01 MED ORDER — LIDOCAINE HCL (CARDIAC) PF 100 MG/5ML IV SOSY
PREFILLED_SYRINGE | INTRAVENOUS | Status: DC | PRN
  Administered 2018-04-01: 100 mg via INTRAVENOUS

## 2018-04-01 MED ORDER — CALCIUM CARBONATE ANTACID 500 MG PO CHEW: 4.0000 | CHEWABLE_TABLET | Freq: Every day | ORAL | Status: DC | PRN

## 2018-04-01 MED ORDER — FUROSEMIDE 10 MG/ML IJ SOLN
10.0000 mg | Freq: Once | INTRAMUSCULAR | Status: AC
  Administered 2018-04-01: 10 mg via INTRAVENOUS

## 2018-04-01 MED ORDER — PROMETHAZINE HCL 25 MG/ML IJ SOLN: 6.2500 mg | INTRAMUSCULAR | Status: DC | PRN

## 2018-04-01 MED ORDER — GABAPENTIN 400 MG PO CAPS
800.0000 mg | ORAL_CAPSULE | Freq: Four times a day (QID) | ORAL | Status: DC
  Administered 2018-04-01 – 2018-04-07 (×24): 800 mg via ORAL
  Filled 2018-04-01 (×25): qty 2

## 2018-04-01 MED ORDER — TRAZODONE HCL 50 MG PO TABS: 50.0000 mg | ORAL_TABLET | Freq: Every evening | ORAL | Status: DC | PRN

## 2018-04-01 MED ORDER — ROCURONIUM BROMIDE 100 MG/10ML IV SOLN
INTRAVENOUS | Status: DC | PRN
  Administered 2018-04-01: 20 mg via INTRAVENOUS

## 2018-04-01 MED ORDER — MORPHINE SULFATE (PF) 4 MG/ML IV SOLN
4.0000 mg | INTRAVENOUS | Status: DC | PRN
  Administered 2018-04-01 – 2018-04-02 (×5): 4 mg via INTRAVENOUS
  Filled 2018-04-01 (×5): qty 1

## 2018-04-01 MED ORDER — METHYLPREDNISOLONE SODIUM SUCC 125 MG IJ SOLR
62.5000 mg | Freq: Once | INTRAMUSCULAR | Status: AC
  Administered 2018-04-01: 62.5 mg via INTRAVENOUS

## 2018-04-01 MED ORDER — VANCOMYCIN HCL 10 G IV SOLR
1500.0000 mg | INTRAVENOUS | Status: DC
  Administered 2018-04-02: 1500 mg via INTRAVENOUS
  Filled 2018-04-01 (×2): qty 1500

## 2018-04-01 MED ORDER — ONDANSETRON HCL 4 MG/2ML IJ SOLN
INTRAMUSCULAR | Status: DC | PRN
  Administered 2018-04-01: 4 mg via INTRAVENOUS

## 2018-04-01 MED ORDER — EZETIMIBE 10 MG PO TABS
10.0000 mg | ORAL_TABLET | Freq: Every day | ORAL | Status: DC
  Administered 2018-04-01 – 2018-04-07 (×7): 10 mg via ORAL
  Filled 2018-04-01 (×7): qty 1

## 2018-04-01 MED ORDER — GABAPENTIN 800 MG PO TABS
800.0000 mg | ORAL_TABLET | Freq: Four times a day (QID) | ORAL | Status: DC
  Filled 2018-04-01 (×2): qty 1

## 2018-04-01 MED ORDER — ATORVASTATIN CALCIUM 20 MG PO TABS
80.0000 mg | ORAL_TABLET | Freq: Every day | ORAL | Status: DC
  Administered 2018-04-01 – 2018-04-07 (×7): 80 mg via ORAL
  Filled 2018-04-01 (×7): qty 4

## 2018-04-01 MED ORDER — OXYCODONE HCL 5 MG PO TABS
ORAL_TABLET | ORAL | Status: AC
  Administered 2018-04-01: 5 mg via ORAL
  Filled 2018-04-01: qty 1

## 2018-04-01 MED ORDER — METHYLPREDNISOLONE SODIUM SUCC 125 MG IJ SOLR
INTRAMUSCULAR | Status: AC
  Filled 2018-04-01: qty 2

## 2018-04-01 SURGICAL SUPPLY — 38 items
BANDAGE ELASTIC 6 LF NS (GAUZE/BANDAGES/DRESSINGS) ×2 IMPLANT
BLADE SAGITTAL WIDE XTHICK NO (BLADE) ×2 IMPLANT
BNDG COHESIVE 4X5 TAN STRL (GAUZE/BANDAGES/DRESSINGS) ×2 IMPLANT
BNDG GAUZE 4.5X4.1 6PLY STRL (MISCELLANEOUS) ×4 IMPLANT
BRUSH SCRUB EZ  4% CHG (MISCELLANEOUS) ×1
BRUSH SCRUB EZ 4% CHG (MISCELLANEOUS) ×1 IMPLANT
CANISTER SUCT 1200ML W/VALVE (MISCELLANEOUS) ×2 IMPLANT
COVER WAND RF STERILE (DRAPES) ×2 IMPLANT
DRAIN PENROSE 1/4X12 LTX (DRAIN) ×2 IMPLANT
DRAPE STERI IOBAN 125X83 (DRAPES) IMPLANT
DURAPREP 26ML APPLICATOR (WOUND CARE) ×2 IMPLANT
ELECT CAUTERY BLADE 6.4 (BLADE) ×2 IMPLANT
ELECT REM PT RETURN 9FT ADLT (ELECTROSURGICAL) ×2
ELECTRODE REM PT RTRN 9FT ADLT (ELECTROSURGICAL) ×1 IMPLANT
GAUZE PETRO XEROFOAM 1X8 (MISCELLANEOUS) ×4 IMPLANT
GLOVE BIO SURGEON STRL SZ7 (GLOVE) ×4 IMPLANT
GLOVE INDICATOR 7.5 STRL GRN (GLOVE) ×2 IMPLANT
GOWN STRL REUS W/ TWL LRG LVL3 (GOWN DISPOSABLE) ×2 IMPLANT
GOWN STRL REUS W/ TWL XL LVL3 (GOWN DISPOSABLE) ×2 IMPLANT
GOWN STRL REUS W/TWL LRG LVL3 (GOWN DISPOSABLE) ×2
GOWN STRL REUS W/TWL XL LVL3 (GOWN DISPOSABLE) ×2
HANDLE YANKAUER SUCT BULB TIP (MISCELLANEOUS) ×2 IMPLANT
KIT TURNOVER KIT A (KITS) ×2 IMPLANT
LABEL OR SOLS (LABEL) ×2 IMPLANT
NS IRRIG 1000ML POUR BTL (IV SOLUTION) ×2 IMPLANT
PACK EXTREMITY ARMC (MISCELLANEOUS) ×2 IMPLANT
PAD ABD DERMACEA PRESS 5X9 (GAUZE/BANDAGES/DRESSINGS) IMPLANT
PAD PREP 24X41 OB/GYN DISP (PERSONAL CARE ITEMS) ×2 IMPLANT
SPONGE LAP 18X18 RF (DISPOSABLE) ×2 IMPLANT
STAPLER SKIN PROX 35W (STAPLE) ×2 IMPLANT
STOCKINETTE M/LG 89821 (MISCELLANEOUS) ×2 IMPLANT
SUT SILK 2 0 (SUTURE)
SUT SILK 2 0 SH (SUTURE) IMPLANT
SUT SILK 2-0 18XBRD TIE 12 (SUTURE) IMPLANT
SUT SILK 3 0 (SUTURE)
SUT SILK 3-0 18XBRD TIE 12 (SUTURE) IMPLANT
SUT VIC AB 0 CT1 36 (SUTURE) ×4 IMPLANT
SUT VIC AB 2-0 CT1 (SUTURE) ×4 IMPLANT

## 2018-04-01 NOTE — Anesthesia Procedure Notes (Signed)
Procedure Name: Intubation Performed by: Lesle Reek, CRNA Pre-anesthesia Checklist: Patient identified, Emergency Drugs available, Timeout performed, Patient being monitored and Suction available Patient Re-evaluated:Patient Re-evaluated prior to induction Oxygen Delivery Method: Circle system utilized Preoxygenation: Pre-oxygenation with 100% oxygen Induction Type: IV induction Laryngoscope Size: Mac and 4 Grade View: Grade I Tube type: Oral Tube size: 7.5 mm Number of attempts: 1 Airway Equipment and Method: Stylet Placement Confirmation: ETT inserted through vocal cords under direct vision,  breath sounds checked- equal and bilateral,  CO2 detector and positive ETCO2 Secured at: 22 cm Tube secured with: Tape

## 2018-04-01 NOTE — Anesthesia Preprocedure Evaluation (Addendum)
Anesthesia Evaluation  Patient identified by MRN, date of birth, ID band Patient awake    Reviewed: Allergy & Precautions, NPO status , Patient's Chart, lab work & pertinent test results  History of Anesthesia Complications Negative for: history of anesthetic complications  Airway Mallampati: II  TM Distance: >3 FB Neck ROM: Full    Dental  (+) Edentulous Upper, Edentulous Lower   Pulmonary asthma , sleep apnea , COPD,  COPD inhaler, Current Smoker,    breath sounds clear to auscultation- rhonchi (-) wheezing      Cardiovascular hypertension, + CAD, + Past MI, + Cardiac Stents, + Peripheral Vascular Disease and +CHF   Rhythm:Regular Rate:Normal - Systolic murmurs and - Diastolic murmurs Echo 2/77/82: - Left ventricle: The cavity size was moderately dilated. Wall   thickness was increased in a pattern of mild LVH. Systolic   function was moderately to severely reduced. The estimated   ejection fraction was in the range of 30% to 35%. Diffuse   hypokinesis. Regional wall motion abnormalities cannot be   excluded. Doppler parameters are consistent with restrictive   physiology, indicative of decreased left ventricular diastolic   compliance and/or increased left atrial pressure. Doppler   parameters are consistent with high ventricular filling pressure. - Mitral valve: Calcified annulus. Mildly thickened leaflets . - Left atrium: The atrium was mildly dilated. - Right ventricle: The cavity size was mildly to moderately   dilated. Systolic function was normal. - Right atrium: The atrium was mildly dilated. - Pulmonary arteries: Systolic pressure was mildly to moderately   increased, in the range of 40 mm Hg to 45 mm Hg.    Neuro/Psych negative neurological ROS  negative psych ROS   GI/Hepatic negative GI ROS, Neg liver ROS,   Endo/Other  diabetes, Oral Hypoglycemic Agents  Renal/GU negative Renal ROS      Musculoskeletal   Abdominal (+) + obese,   Peds  Hematology   Anesthesia Other Findings Past Medical History: No date: Arthritis No date: Asthma No date: Atherosclerosis No date: BPH (benign prostatic hyperplasia) No date: Carotid arterial disease ([place]) No date: Charcot's joint of foot, right No date: Chronic combined systolic (congestive) and diastolic  (congestive) heart failure ([place])     Comment:  a. 02/2013 EF 50% by LV gram; b. 04/2017 Echo: EF 25-30%.              diff HK. Gr2 DD. Mod dil LA/RV. PASP 21mmHg. No date: COPD (chronic obstructive pulmonary disease) ([facility]) No date: Coronary artery disease     Comment:  a. 2013 S/P PCI of LAD ([facility]);  b. 03/2013 PCI: RCA 90p (              2.5 x 23 mm DES); c. 02/2014 Cath: patent RCA stent->Med               Rx; d. 04/2017 Cath: LM nl, LAd 30ost/p, 33m/d, D1 80ost,              LCX 90p/m, OM1 85, RCA 70/20p, 73m, 70d->Referred for CT               Surg-felt to be poor candidate. No date: Diabetic neuropathy ([place]) No date: Diabetic ulcer of right foot ([place]) No date: Hernia No date: Hyperlipidemia No date: Hypertension No date: Ischemic cardiomyopathy     Comment:  a. 04/2017 Echo: EF 25-30%; b. 08/2017 Cardiac MRI               ([facility]): EF  19%, sev glob HK. RVEF 20%, mod BAE, triv MR,               mild-mod TR. Basal lateral subendocardial infarct               (viable), inf/infsept, dist septal ischemia, basal to mid              lat peri-infarct ischemia. No date: Morbid obesity ([place]) No date: Neuropathy No date: PAD (peripheral artery disease) (H[facility])     Comment:  a. Followed by Dr. Lucky Cowboy; b. 08/2010 Periph Angio: RSFA               70-80p (6X60 self-expanding stent); c. 09/2010 Periph               Angio: L SFA 100p (7x unknown length self-expanding               stent); d. 06/2015 Periph Angio: R SFA short segment               occlusion (6x12 self-expanding stent). No date: Restless leg syndrome No date: Sleep apnea No  date: Subclavian artery stenosis, right (HCC) No date: Syncope and collapse No date: Tobacco use     Comment:  a. 75+ yr hx - still smoking 1ppd, down from 2 ppd. No date: Type II diabetes mellitus (HCC) No date: Varicose vein   Reproductive/Obstetrics                            Anesthesia Physical Anesthesia Plan  ASA: III  Anesthesia Plan: General   Post-op Pain Management:    Induction: Intravenous  PONV Risk Score and Plan: 0 and Ondansetron  Airway Management Planned: Oral ETT  Additional Equipment:   Intra-op Plan:   Post-operative Plan: Extubation in OR  Informed Consent: I have reviewed the patients History and Physical, chart, labs and discussed the procedure including the risks, benefits and alternatives for the proposed anesthesia with the patient or authorized representative who has indicated his/her understanding and acceptance.   Dental advisory given  Plan Discussed with: CRNA and Anesthesiologist  Anesthesia Plan Comments:         Anesthesia Quick Evaluation

## 2018-04-01 NOTE — Anesthesia Postprocedure Evaluation (Signed)
Anesthesia Post Note  Patient: [name] [name]  Procedure(s) Performed: AMPUTATION BELOW KNEE (Right )  Patient location during evaluation: PACU Anesthesia Type: General Level of consciousness: awake and alert Pain management: pain level controlled Vital Signs Assessment: post-procedure vital signs reviewed and stable Respiratory status: spontaneous breathing, nonlabored ventilation, respiratory function stable and patient connected to nasal cannula oxygen Cardiovascular status: blood pressure returned to baseline and stable Postop Assessment: no apparent nausea or vomiting Anesthetic complications: no     Last Vitals:  Vitals:   04/01/18 1632 04/01/18 1735  BP: (!) 142/77 (!) 146/84  Pulse: (!) 106 (!) 110  Resp: 17 18  Temp: 36.8 C 36.4 C  SpO2: 100% 95%    Last Pain:  Vitals:   04/01/18 1812  TempSrc:   PainSc: [place]

## 2018-04-01 NOTE — Progress Notes (Signed)
Verbal order to increase PRN morphine to 4mg  Q4 PRN and restart patients gabapentin 800mg  4 times a day.   Addilynne Olheiser [employer]

## 2018-04-01 NOTE — H&P (Signed)
[facility] History & Physical Update  The patient was interviewed and re-examined.  The patient's previous History and Physical has been reviewed and is unchanged.  There is no change in the plan of care. We plan to proceed with the scheduled procedure.  Leotis Pain, MD  04/01/2018, 12:07 PM

## 2018-04-01 NOTE — Progress Notes (Signed)
Informed Dr. Marcello Moores patient is having difficulty breathing and is wheezing upon ascultation, duoneb ordered.

## 2018-04-01 NOTE — Op Note (Signed)
   OPERATIVE NOTE   PROCEDURE: Right below-the-knee amputation  PRE-OPERATIVE DIAGNOSIS: Right foot and ankle osteomyelitis  POST-OPERATIVE DIAGNOSIS: same as above  SURGEON: Leotis Pain, MD  ASSISTANT(S): none  ANESTHESIA: general  ESTIMATED BLOOD LOSS: 200 cc  FINDING(S): none  SPECIMEN(S):  Right below-the-knee amputation  INDICATIONS:   [name] [name] is a 57 y.o. male who presents with right foot and ankle osteomyelitis.  The patient is scheduled for a right below-the-knee amputation.  I discussed in depth with the patient the risks, benefits, and alternatives to this procedure.  The patient is aware that the risk of this operation included but are not limited to:  bleeding, infection, myocardial infarction, stroke, death, failure to heal amputation wound, and possible need for more proximal amputation.  The patient is aware of the risks and agrees proceed forward with the procedure.  DESCRIPTION:  After full informed written consent was obtained from the patient, the patient was brought back to the operating room, and placed supine upon the operating table.  Prior to induction, the patient received IV antibiotics.  The patient was then prepped and draped in the standard fashion for a below-the-knee amputation.  After obtaining adequate anesthesia, the patient was prepped and draped in the standard fashion for a right below-the-knee amputation.  I marked out the anterior incision two finger breadths below the tibial tuberosity and then the marked out a posterior flap that was one third of the circumference of the calf in length.   I made the incisions for these flaps, and then dissected through the subcutaneous tissue, fascia, and muscle anteriorly.  I elevated  the periosteal tissue superiorly so that the tibia was about 3-4 cm shorter than the anterior skin flap.  I then transected the tibia with a power saw and then took a wedge off the tibia anteriorly with the power saw.  Then I  smoothed out the rough edges.  In a similar fashion, I cut back the fibula about two centimeters higher than the level of the tibia with a bone cutter.  I put a bone hook into the distal tibia and then used a large amputation knife to sharply develop a tissue plane through the muscle along the fibula.  In such fashion, the posterior flap was developed.  At this point, the specimen was passed off the field as the below-the-knee amputation.  At this point, I clamped all visibly bleeding arteries and veins using a combination of suture ligation with Silk suture and electrocautery.  Bleeding continued to be controlled with electrocautery and suture ligature.  The stump was washed off with sterile normal saline and no further active bleeding was noted.  I reapproximated the anterior and posterior fascia  with interrupted stitches of 0 Vicryl.  This was completed along the entire length of anterior and posterior fascia until there were no more loose space in the fascial line. I then placed a layer of 2-0 Vicryl sutures in the subcutaneous tissue. The skin was then  reapproximated with staples.  The stump was washed off and dried.  The incision was dressed with Xeroform and  then fluffs were applied.  Kerlix was wrapped around the leg and then gently an ACE wrap was applied.    COMPLICATIONS: none  CONDITION: stable   Leotis Pain  04/01/2018, 2:18 PM    This note was created with Dragon Medical transcription system. Any errors in dictation are purely unintentional.

## 2018-04-01 NOTE — Progress Notes (Signed)
[facility] NAME: [name] [name]    MR#:  [id number]  DATE OF BIRTH:  1960/12/30  SUBJECTIVE:   Patient still complaining of some right foot pain plan for right BKA today.  Patient is a bit anxious about it.  REVIEW OF SYSTEMS:    Review of Systems  Constitutional: Negative for chills and fever.  HENT: Negative for congestion and tinnitus.   Eyes: Negative for blurred vision and double vision.  Respiratory: Negative for cough, shortness of breath and wheezing.   Cardiovascular: Negative for chest pain, orthopnea and PND.  Gastrointestinal: Negative for abdominal pain, diarrhea, nausea and vomiting.  Genitourinary: Negative for dysuria and hematuria.  Musculoskeletal: Positive for joint pain (Right Foot pain. ).  Neurological: Negative for dizziness, sensory change and focal weakness.  Psychiatric/Behavioral: Negative for substance abuse.  All other systems reviewed and are negative.   Nutrition: Heart healthy/Carb control Tolerating Diet: Yes Tolerating PT: Await Eval Post-op.    DRUG ALLERGIES:   Allergies  Allergen Reactions  . Dulaglutide Anaphylaxis, Diarrhea and Hives  . Prednisone Rash    VITALS:  Blood pressure 139/74, pulse (!) 111, temperature 99.8 F (37.7 C), resp. rate (!) 28, height 5\' 7"  (1.702 m), weight 99.8 kg, SpO2 100 %.  PHYSICAL EXAMINATION:   Physical Exam  GENERAL:  57 y.o.-year-old patient lying in bed a bit anxious but in NAD.  EYES: Pupils equal, round, reactive to light and accommodation. No scleral icterus. Extraocular muscles intact.  HEENT: Head atraumatic, normocephalic. Oropharynx and nasopharynx clear.  NECK:  Supple, no jugular venous distention. No thyroid enlargement, no tenderness.  LUNGS: Normal breath sounds bilaterally, no wheezing, rales, rhonchi. No use of accessory muscles of respiration.  CARDIOVASCULAR: S1, S2 normal. No murmurs, rubs, or gallops.  ABDOMEN: Soft,  nontender, nondistended. Bowel sounds present. No organomegaly or mass.  EXTREMITIES: No cyanosis, clubbing or edema b/l.   Right foot dressing in place with whole foot covered.  No drainage/bleeding. NEUROLOGIC: Cranial nerves II through XII are intact. No focal Motor or sensory deficits b/l.   PSYCHIATRIC: The patient is alert and oriented x 3.  SKIN: No obvious rash, lesion, or ulcer.    LABORATORY PANEL:   CBC Recent Labs  Lab 04/01/18 0816  WBC 7.5  HGB 9.2*  HCT 30.7*  PLT 304   ------------------------------------------------------------------------------------------------------------------  Chemistries  Recent Labs  Lab 03/31/18 0606 04/01/18 0816  NA 134* 138  K 5.0 4.7  CL 95* 99  CO2 27 30  GLUCOSE 153* 135*  BUN 17 10  CREATININE 0.99 0.89  CALCIUM 8.6* 8.8*  AST 21  --   ALT 18  --   ALKPHOS 97  --   BILITOT 0.7  --    ------------------------------------------------------------------------------------------------------------------  Cardiac Enzymes No results for input(s): TROPONINI in the last 168 hours. ------------------------------------------------------------------------------------------------------------------  RADIOLOGY:  Dg Foot Complete Right  Result Date: 03/30/2018 CLINICAL DATA:  57 y/o  M; 3 days of fever. Known right foot wound. EXAM: RIGHT FOOT COMPLETE - 3+ VIEW COMPARISON:  01/02/2018 right foot radiograph. 01/03/2018 right foot MRI. FINDINGS: Extensive diffuse bony erosive changes of the tarsometatarsal and intertarsal joints greatest laterally compatible with osteomyelitis. Associated interval disorganization and collapse of the midfoot. Extensive surrounding soft tissue swelling with lateral soft tissue ulcer. IMPRESSION: Progressive extensive bony erosive changes of the tarsometatarsal and intertarsal joints greatest laterally compatible with osteomyelitis. Electronically Signed   By: Edgardo Roys.D.  On: 03/30/2018  16:12     ASSESSMENT AND PLAN:   57 yo male w/ hx of DM, HTN, hyperlipidemia, morbid obesity, ischemic cardiomyopathy, hypertension, COPD, chronic combined systolic diastolic CHF who presents to the hospital due to right foot pain noted to have a right diabetic foot ulcer.  1.  Right diabetic foot ulcer/osteomyelitis- patient has had this for quite a while now presented with worsening fever and pain. - Continue broad-spectrum IV antibiotics with vancomycin, Zosyn. - Plan is for right sided BKA today.  Appreciate podiatry and vascular surgery input.  2.  Diabetes type 2 without complication- continue sliding scale insulin. - BS Stable.  3.  Tobacco abuse-continue nicotine patch.  4.  Anxiety-continue as needed Xanax.  5.  Hyperlipidemia-continue atorvastatin.  6.  Neuropathy secondary to diabetes-continue gabapentin.    All the records are reviewed and case discussed with Care Management/Social Worker. Management plans discussed with the patient, family and they are in agreement.  CODE STATUS: Full code  DVT Prophylaxis: Ted's and SCD's.   TOTAL TIME TAKING CARE OF THIS PATIENT: 25 minutes.   POSSIBLE D/C IN 2-3 DAYS, DEPENDING ON CLINICAL CONDITION.   Henreitta Leber M.D on 04/01/2018 at 3:11 PM  Between 7am to 6pm - Pager - [phone]  After 6pm go to [email or web address] - Technical brewer [facility]  Office  [phone]  CC: Primary care physician; Clarisse Gouge, MD

## 2018-04-01 NOTE — Consult Note (Signed)
Pharmacy Antibiotic Note  [name] [name] is a 57 y.o. male admitted on 03/30/2018 with cellulitis and diabetic foot infection.  Pharmacy has been consulted for Zosyn and vancomycin dosing. He has been treated previously with vancomycin at this hospital but his renal function was impaired throughout the visit. On admission 10/21 his SCr was elevated above his baseline and is now corrected to baseline (dose was adjusted when AKI resolved).This is day number 3 of IV antibiotics. Leukocytosis now improved. He is scheduled for a right BKA 10/23. Vancomycin dose for 1200 was scanned but not administered  10/23 1049 Vt 33 mcg/mL   Plan:  1) change vancomycin dose to 1500 mg IV every 18 hours.  Next dose tonight at midnight  Goal trough 15-20 mcg/mL  K 0.05  Vd 70L  T1/2: 14h  Calculated concentrations at steady-state: 35.2/15.4 mcg/mL  Will draw trough prior to the 4th dose.  2) continue Zosyn 3.375g IV q8h (4 hour infusion).  Height: 5\' 7"  (170.2 cm) Weight: 220 lb (99.8 kg) IBW/kg (Calculated) : 66.1  Temp (24hrs), Avg:98.1 F (36.7 C), Min:97.8 F (36.6 C), Max:98.4 F (36.9 C)  Recent Labs  Lab 03/30/18 1440 03/30/18 1727 03/30/18 1903 03/31/18 0606 04/01/18 0816  WBC 10.9*  --  10.3 7.7 7.5  CREATININE 0.86  --  1.27* 0.99 0.89  LATICACIDVEN 2.3* 1.7 1.5  --   --     Estimated Creatinine Clearance: 105.6 mL/min (by C-G formula based on SCr of 0.89 mg/dL).    Allergies  Allergen Reactions  . Dulaglutide Anaphylaxis, Diarrhea and Hives  . Prednisone Rash    Antimicrobials this admission: Zosyn 10/21 >>  Vanco 10/21 >>   Dose adjustments this admission: 10/22: vancomycin 1500mg  q12h--->1250mg  q8h 10/23: vancomycin 1250mg  q8h----->1500mg  q18h  Microbiology results: 10/21 BCx: pending 10/21 UCx: NGF 10/22 MRSA PCR (-)  Thank you for allowing pharmacy to be a part of this patient's care.  Dallie Piles, PharmD 04/01/2018 8:55 AM

## 2018-04-01 NOTE — Transfer of Care (Signed)
Immediate Anesthesia Transfer of Care Note  Patient: [name] [name]  Procedure(s) Performed: AMPUTATION BELOW KNEE (Right )  Patient Location: PACU  Anesthesia Type:General  Level of Consciousness: drowsy and responds to stimulation  Airway & Oxygen Therapy: Patient Spontanous Breathing and Patient connected to face mask oxygen  Post-op Assessment: Report given to RN and Post -op Vital signs reviewed and stable  Post vital signs: Reviewed and stable  Last Vitals:  Vitals Value Taken Time  BP 134/95 04/01/2018  2:28 PM  Temp    Pulse 101 04/01/2018  2:28 PM  Resp 30 04/01/2018  2:28 PM  SpO2 100 % 04/01/2018  2:28 PM    Last Pain:  Vitals:   04/01/18 1204  TempSrc: Tympanic  PainSc:       Patients Stated Pain Goal: 1 (69/67/89 3810)  Complications: No apparent anesthesia complications

## 2018-04-01 NOTE — Anesthesia Post-op Follow-up Note (Signed)
Anesthesia QCDR form completed.        

## 2018-04-02 ENCOUNTER — Encounter: Payer: Self-pay | Admitting: Vascular Surgery

## 2018-04-02 DIAGNOSIS — L97519 Non-pressure chronic ulcer of other part of right foot with unspecified severity: Secondary | ICD-10-CM

## 2018-04-02 DIAGNOSIS — M86179 Other acute osteomyelitis, unspecified ankle and foot: Secondary | ICD-10-CM

## 2018-04-02 DIAGNOSIS — L03115 Cellulitis of right lower limb: Secondary | ICD-10-CM

## 2018-04-02 DIAGNOSIS — A419 Sepsis, unspecified organism: Principal | ICD-10-CM

## 2018-04-02 DIAGNOSIS — E11621 Type 2 diabetes mellitus with foot ulcer: Secondary | ICD-10-CM

## 2018-04-02 LAB — GLUCOSE, CAPILLARY
Glucose-Capillary: 287 mg/dL — ABNORMAL HIGH (ref 70–99)
Glucose-Capillary: 354 mg/dL — ABNORMAL HIGH (ref 70–99)
Glucose-Capillary: 151 mg/dL — ABNORMAL HIGH (ref 70–99)
Glucose-Capillary: 161 mg/dL — ABNORMAL HIGH (ref 70–99)

## 2018-04-02 LAB — CBC
HCT: 28.3 % — ABNORMAL LOW (ref 39.0–52.0)
Hemoglobin: 8.5 g/dL — ABNORMAL LOW (ref 13.0–17.0)
MCH: 21 pg — ABNORMAL LOW (ref 26.0–34.0)
MCHC: 30 g/dL (ref 30.0–36.0)
MCV: 69.9 fL — ABNORMAL LOW (ref 80.0–100.0)
Platelets: 333 10*3/uL (ref 150–400)
RBC: 4.05 MIL/uL — ABNORMAL LOW (ref 4.22–5.81)
RDW: 20.2 % — ABNORMAL HIGH (ref 11.5–15.5)
WBC: 10 10*3/uL (ref 4.0–10.5)
nRBC: 0 % (ref 0.0–0.2)

## 2018-04-02 LAB — BASIC METABOLIC PANEL
Anion gap: 5 (ref 5–15)
BUN: 14 mg/dL (ref 6–20)
CO2: 30 mmol/L (ref 22–32)
Calcium: 8.3 mg/dL — ABNORMAL LOW (ref 8.9–10.3)
Chloride: 96 mmol/L — ABNORMAL LOW (ref 98–111)
Creatinine, Ser: 0.77 mg/dL (ref 0.61–1.24)
GFR calc Af Amer: >60 mL/min (ref >60)
GFR calc non Af Amer: >60 mL/min (ref >60)
Glucose, Bld: 329 mg/dL — ABNORMAL HIGH (ref 70–99)
Potassium: 4.8 mmol/L (ref 3.5–5.1)
Sodium: 131 mmol/L — ABNORMAL LOW (ref 135–145)

## 2018-04-02 MED ORDER — INSULIN GLARGINE 100 UNIT/ML ~~LOC~~ SOLN
25.0000 [IU] | Freq: Every day | SUBCUTANEOUS | Status: DC
  Administered 2018-04-02: 25 [IU] via SUBCUTANEOUS
  Filled 2018-04-02 (×2): qty 0.25

## 2018-04-02 MED ORDER — KETOROLAC TROMETHAMINE 30 MG/ML IJ SOLN
30.0000 mg | Freq: Four times a day (QID) | INTRAMUSCULAR | Status: AC
  Administered 2018-04-02 – 2018-04-05 (×12): 30 mg via INTRAVENOUS
  Filled 2018-04-02 (×12): qty 1

## 2018-04-02 MED ORDER — OXYCODONE HCL 5 MG PO TABS
10.0000 mg | ORAL_TABLET | ORAL | Status: DC | PRN
  Administered 2018-04-02 – 2018-04-04 (×5): 10 mg via ORAL
  Filled 2018-04-02 (×5): qty 2

## 2018-04-02 MED ORDER — MORPHINE SULFATE (PF) 4 MG/ML IV SOLN
4.0000 mg | INTRAVENOUS | Status: DC | PRN
  Administered 2018-04-02 – 2018-04-07 (×11): 4 mg via INTRAVENOUS
  Filled 2018-04-02 (×11): qty 1

## 2018-04-02 MED ORDER — MORPHINE SULFATE (PF) 4 MG/ML IV SOLN: 4.0000 mg | INTRAVENOUS | Status: DC | PRN

## 2018-04-02 MED ORDER — INSULIN ASPART 100 UNIT/ML ~~LOC~~ SOLN
4.0000 [IU] | Freq: Three times a day (TID) | SUBCUTANEOUS | Status: DC
  Administered 2018-04-02 – 2018-04-03 (×3): 4 [IU] via SUBCUTANEOUS
  Filled 2018-04-02 (×4): qty 1

## 2018-04-02 MED ORDER — SODIUM CHLORIDE 1 G PO TABS
2.0000 g | ORAL_TABLET | Freq: Two times a day (BID) | ORAL | Status: AC
  Administered 2018-04-02 – 2018-04-03 (×2): 2 g via ORAL
  Filled 2018-04-02 (×2): qty 2

## 2018-04-02 MED ORDER — ADULT MULTIVITAMIN W/MINERALS CH
1.0000 | ORAL_TABLET | Freq: Every day | ORAL | Status: DC
  Administered 2018-04-02 – 2018-04-07 (×6): 1 via ORAL
  Filled 2018-04-02 (×6): qty 1

## 2018-04-02 NOTE — Progress Notes (Signed)
Inpatient Diabetes Program Recommendations  AACE/ADA: New Consensus Statement on Inpatient Glycemic Control (2015)  Target Ranges:  Prepandial:   less than 140 mg/dL      Peak postprandial:   less than 180 mg/dL (1-2 hours)      Critically ill patients:  140 - 180 mg/dL   Results for [name] [name], [name] [name] (MRN [id number]) as of 04/02/2018 13:07  Ref. Range 04/01/2018 07:53 04/01/2018 11:47 04/01/2018 14:30 04/01/2018 16:33 04/01/2018 21:11  Glucose-Capillary Latest Ref Range: 70 - 99 mg/dL 124 (H)  1 unit NOVOLOG  132 (H) 192 (H)   62.5 mg IV Solumedrol at 3pm 260 (H)  5 units NOVOLOG  437 (H)  14 units NOVOLOG +  15 units LANTUS   Results for [name] [name], [name] [name] (MRN [id number]) as of 04/02/2018 13:07  Ref. Range 04/02/2018 07:39 04/02/2018 11:34  Glucose-Capillary Latest Ref Range: 70 - 99 mg/dL 287 (H)  5 units NOVOLOG  354 (H)  9 units NOVOLOG    Results for [name] [name], [name] [name] (MRN [id number]) as of 04/02/2018 13:07  Ref. Range 11/21/2017 08:12  Hemoglobin A1C Latest Ref Range: 4.8 - 5.6 % 8.5 (H)    Admit: Sepsis from right foot osteomyelitis  History: DM, CHF, COPD  Home DM Meds: Lantus 70 units QHS (Pt NOT taking)                              Metformin 500 mg Daily  Current Orders: Lantus 15 units QHS      Novolog Sensitive Correction Scale/ SSI (0-9 units) TID AC + HS       Lantus started last PM.   Pt received 62.5 mg Solumedrol X 1 dose yest at 3pm for surgery. As a result, CBG was 437 mg/dl by bedtime. Pt given 14 units Novolog for this CBG. Still elevated this AM.  Per records, patient NO longer taking Lantus at home, however, per last visit with Primary MD (Dr. Vickki Muff with [facility]) on 03/19/2018, patient is supposed to be taking Lantus 40 units BID + Metformin 1000 mg BID.  Looks like patient may have stopped Insulin on his own?     MD- Please consider the following in-hospital insulin adjustments:  1. Increase Lantus to 25  units QHS (0.25 units/kg dosing based on weight of 99.8kg)  2. Start Novolog Meal Coverage: Novolog 4 units TID with meals   (Please add the following Hold Parameters: Hold if pt eats <50% of meal, Hold if pt NPO)     --Will follow patient during hospitalization--  Wyn Quaker RN, MSN, CDE Diabetes Coordinator Inpatient Glycemic Control Team Team Pager: [phone] (8a-5p)

## 2018-04-02 NOTE — Progress Notes (Signed)
[facility]  Daily Progress Note   Subjective: 1 Day Post-Op: Right below-the-knee amputation  Patient complaining of right stump pain.  Night of surgery was unremarkable.  Objective: Vitals:   04/01/18 2101 04/02/18 0415 04/02/18 0806 04/02/18 1134  BP: (!) 140/91 137/88 130/82 129/83  Pulse: (!) 109 (!) 102 (!) 107 (!) 108  Resp: 20 18 18 17   Temp: 97.7 F (36.5 C) 97.9 F (36.6 C)  97.6 F (36.4 C)  TempSrc: Oral Oral  Oral  SpO2: 96% 98% 99% 96%  Weight:      Height:        Intake/Output Summary (Last 24 hours) at 04/02/2018 1249 Last data filed at 04/02/2018 1137 Gross per 24 hour  Intake 2434.03 ml  Output 2025 ml  Net 409.03 ml   Physical Exam: A&Ox3, NAD CV: RRR Pulmonary: CTA Bilaterally Abdomen: Soft, Nontender, Nondistended Vascular:  Right lower extremity: OR dressing intact, clean and dry.  Thigh is soft.  Patient is able to  straighten knee.   Laboratory: CBC    Component Value Date/Time   WBC 10.0 04/02/2018 0610   HGB 8.5 (L) 04/02/2018 0610   HGB 15.0 06/08/2014 1037   HCT 28.3 (L) 04/02/2018 0610   HCT 44.5 06/08/2014 1037   PLT 333 04/02/2018 0610   PLT 261 06/08/2014 1037   BMET    Component Value Date/Time   NA 131 (L) 04/02/2018 0610   NA 132 (L) 01/29/2018 1602   NA 136 06/08/2014 1037   K 4.8 04/02/2018 0610   K 4.3 06/08/2014 1037   CL 96 (L) 04/02/2018 0610   CL 101 06/08/2014 1037   CO2 30 04/02/2018 0610   CO2 27 06/08/2014 1037   GLUCOSE 329 (H) 04/02/2018 0610   GLUCOSE 415 (H) 06/08/2014 1037   BUN 14 04/02/2018 0610   BUN 9 01/29/2018 1602   BUN 9 06/08/2014 1037   CREATININE 0.77 04/02/2018 0610   CREATININE 0.87 02/05/2018 1417   CALCIUM 8.3 (L) 04/02/2018 0610   CALCIUM 9.2 06/08/2014 1037   GFRNONAA >60 04/02/2018 0610   GFRNONAA >60 06/08/2014 1037   GFRNONAA >60 02/17/2014 0909   GFRAA >60 04/02/2018 0610   GFRAA >60 06/08/2014 1037   GFRAA >60 02/17/2014 0909    Assessment/Planning: 57 year old male status post right below the knee amputation - Stable 1) repleted sodium and chloride 2) added oxycodone 5mg  1-2 tabs every 4 hours as needed for pain 3) added Toradol 30 mg every 6 hours x3 days.  Follow-up daily BMP. 4) decreased morphine to every 4 hours since I added 5 mg of oxycodone and Toradol to the patient's pain regimen 5) OR dressing removal either Friday or Saturday 6) awaiting physical therapy recommendations will most likely discharge to rehab consulted social work. 7) from vascular surgery standpoint ok to stop IV ABX  Discussed with Eliot Ford PA-C 04/02/2018 12:49 PM

## 2018-04-02 NOTE — Progress Notes (Signed)
Rehab Admissions Coordinator Note:  Per PT and OT recommendation, Patient was screened by Jhonnie Garner for appropriateness for an Inpatient Acute Rehab Consult.  At this time, we are recommending Inpatient Rehab consult. I have placed an IP rehab consult order and will contact the patient regarding CIR program.   Jhonnie Garner 04/02/2018, 4:08 PM  I can be reached at [phone].

## 2018-04-02 NOTE — Evaluation (Signed)
Occupational Therapy Evaluation Patient Details Name: [name] [name] MRN: [id number] DOB: [birth year] Today's Date: 04/02/2018    History of Present Illness [name] [name] is a 57 year old male with diabetes and associated neuropathy with a chronic history of osteomyelitis in his right foot.  Patient has had multiple debridements on the right foot and recently decision was made for below-knee amputation on 04-01-18.   Clinical Impression   Patient is 57 y.o.male who was admitted for a R BKA on 04-01-18.  He has a hstory of osteomyelitis and diabetes.  He lives in a one story small home with his girlfriend.  He presents with pain 10/10 in RLE above and at residual limb area below the knee. He has been receiving pain medications but is 45 minutes away from next dose.  Discussed strategies on how to manage phantom pain he is having in RLE with an itching sensation.  Also initiated guidelines and importance of stump shaping, edema management and increasing UB strengthening to help him in future when he has a prosthetic in place.  He has full AROM in BUEs but strength is 4+/5 but is motivated to regain strength, functional endurance and return to PLOF with independence in ADLs.  Patient could benefit from skilled OT services to work on ADL retraining with AD as needed (ie elastic shoe laces, reacher, sock aid etc for LLE), balance activities, UB strengthening and exercise program, sensory training and stump shaping for prosthethesis and pt./family education. Patient would be a great candidate to continue OT services at CIR.    Follow Up Recommendations  CIR    Equipment Recommendations       Recommendations for Other Services       Precautions / Restrictions Precautions Precautions: Fall Restrictions Weight Bearing Restrictions: No Other Position/Activity Restrictions:  R BKA 04-01-18      Mobility Bed Mobility                  Transfers                      Balance                                            ADL either performed or assessed with clinical judgement   ADL Overall ADL's : Needs assistance/impaired Eating/Feeding: Independent;Set up   Grooming: Wash/dry hands;Wash/dry face;Applying deodorant;Brushing hair;Set up;Independent Grooming Details (indicate cue type and reason): pt does not have any teeth or dentures---his dog chewed up his dentures about 2 years ago Upper Body Bathing: Independent;Set up   Lower Body Bathing: Moderate assistance;Set up;Bed level   Upper Body Dressing : Independent;Set up   Lower Body Dressing: Moderate assistance;Set up;Bed level Lower Body Dressing Details (indicate cue type and reason): Pt in pain 10/10 and not able to reach L foot due to pain; discussed AD that would be helpful for LB dressing like elastic shoe laces, reacher, sock aid, LH shoe horn                General ADL Comments: Initiated sensory training for area above amputation and eventually once healed the residual limb area.  Also discussed his desire to have prosthetic limb in future and importance of stump shaping and how to achieve this.     Vision Patient Visual Report: No change from baseline       Perception  Praxis      Pertinent Vitals/Pain Pain Assessment: 0-10 Pain Score: 10-Worst pain ever Pain Location: R knee and at stump Pain Descriptors / Indicators: Discomfort;Sore;Operative site guarding;Crushing Pain Intervention(s): Limited activity within patient's tolerance;Monitored during session;Premedicated before session     Hand Dominance Right   Extremity/Trunk Assessment Upper Extremity Assessment Upper Extremity Assessment: Generalized weakness   Lower Extremity Assessment Lower Extremity Assessment: Defer to PT evaluation       Communication Communication Communication: No difficulties   Cognition Arousal/Alertness: Awake/alert Behavior During Therapy: WFL for tasks  assessed/performed;Anxious Overall Cognitive Status: Within Functional Limits for tasks assessed                                     General Comments       Exercises     Shoulder Instructions      Home Living Family/patient expects to be discharged to:: Private residence Living Arrangements: Spouse/significant other(girlfriend) Available Help at Discharge: Friend(s) Type of Home: House Home Access: Stairs to enter [employer] of Steps: 1 Entrance Stairs-Rails: None Home Layout: One level     Bathroom Shower/Tub: Occupational psychologist: Standard     Home Equipment: Walker - standard;Cane - single point;Wheelchair - [employer];Wheelchair - power;Shower seat - built in;Shower seat          Prior Functioning/Environment Level of Independence: Independent        Comments: Pt reports full community ambulation with single point cane. Endorses 2-3 falls in the last 12 months.  His girlfriend has overall deconditioning as well and cannot help tranfer or lift him.        OT Problem List: Decreased strength;Pain;Impaired balance (sitting and/or standing);Decreased activity tolerance;Decreased knowledge of use of DME or AE      OT Treatment/Interventions: Self-care/ADL training;Therapeutic activities;Patient/family education;Therapeutic exercise;DME and/or AE instruction    OT Goals(Current goals can be found in the care plan section) Acute Rehab OT Goals Patient Stated Goal: to regain as much independence as possible OT Goal Formulation: With patient Time For Goal Achievement: 04/16/18 Potential to Achieve Goals: Good ADL Goals Pt Will Perform Lower Body Dressing: with set-up;with adaptive equipment;sit to/from stand;with mod assist Pt/caregiver will Perform Home Exercise Program: Both right and left upper extremity;With written HEP provided;With theraband  OT Frequency: Min 3X/week   Barriers to D/C:             Co-evaluation              AM-PAC PT "6 Clicks" Daily Activity     Outcome Measure Help from another person eating meals?: None Help from another person taking care of personal grooming?: None Help from another person toileting, which includes using toliet, bedpan, or urinal?: A Lot Help from another person bathing (including washing, rinsing, drying)?: A Lot Help from another person to put on and taking off regular upper body clothing?: None Help from another person to put on and taking off regular lower body clothing?: A Lot 6 Click Score: 18   End of Session    Activity Tolerance: Patient limited by pain Patient left: in bed;with call bell/phone within reach;with bed alarm set  OT Visit Diagnosis: Pain;Muscle weakness (generalized) (M62.81) Pain - Right/Left: Right Pain - part of body: Knee(at stump area with phantom pain in R foot with itching sensation)  Time: 4709-2957 OT Time Calculation (min): 40 min Charges:  OT General Charges $OT Visit: 1 Visit OT Evaluation $OT Eval Low Complexity: 1 Low OT Treatments $Self Care/Home Management : 23-37 mins  Chrys Racer, OTR/L ascom [phone] 04/02/18, 12:21 PM

## 2018-04-02 NOTE — Progress Notes (Signed)
[facility] [place] NAME: [name] [name]    MR#:  [id number]  DATE OF BIRTH:  [birth year]  SUBJECTIVE:   Patient would like pain medications for pain status post BKA  REVIEW OF SYSTEMS:    Review of Systems  Constitutional: Negative for chills and fever.  HENT: Negative for congestion and tinnitus.   Eyes: Negative for blurred vision and double vision.  Respiratory: Negative for cough, shortness of breath and wheezing.   Cardiovascular: Negative for chest pain, orthopnea and PND.  Gastrointestinal: Negative for abdominal pain, diarrhea, nausea and vomiting.  Genitourinary: Negative for dysuria and hematuria.  Musculoskeletal: Positive for joint pain (Right Foot pain. ).  Neurological: Negative for dizziness, sensory change and focal weakness.  Psychiatric/Behavioral: Negative for substance abuse.  All other systems reviewed and are negative.     DRUG ALLERGIES:   Allergies  Allergen Reactions  . Dulaglutide Anaphylaxis, Diarrhea and Hives  . Prednisone Rash    VITALS:  Blood pressure 130/82, pulse (!) 107, temperature 97.9 F (36.6 C), temperature source Oral, resp. rate 18, height 5\' 7"  (1.702 m), weight 99.8 kg, SpO2 99 %.  PHYSICAL EXAMINATION:   Physical Exam  GENERAL:  57 y.o.-year-old patient lying in bed a bit anxious but in NAD.  EYES: Pupils equal, round, reactive to light and accommodation. No scleral icterus. Extraocular muscles intact.  HEENT: Head atraumatic, normocephalic. Oropharynx and nasopharynx clear.  NECK:  Supple, no jugular venous distention. No thyroid enlargement, no tenderness.  LUNGS: Normal breath sounds bilaterally, no wheezing, rales, rhonchi. No use of accessory muscles of respiration.  CARDIOVASCULAR: S1, S2 normal. No murmurs, rubs, or gallops.  ABDOMEN: Soft, nontender, nondistended. Bowel sounds present. No organomegaly or mass.  EXTREMITIES: No cyanosis, clubbing or edema b/l.   Right  foot dressing in place with whole foot covered.  No drainage/bleeding. NEUROLOGIC: Cranial nerves II through XII are intact. No focal Motor or sensory deficits b/l.   PSYCHIATRIC: The patient is alert and oriented x 3.  SKIN: No obvious rash, lesion, or ulcer.    LABORATORY PANEL:   CBC Recent Labs  Lab 04/02/18 0610  WBC 10.0  HGB 8.5*  HCT 28.3*  PLT 333   ------------------------------------------------------------------------------------------------------------------  Chemistries  Recent Labs  Lab 03/31/18 0606 04/01/18 0816  NA 134* 138  K 5.0 4.7  CL 95* 99  CO2 27 30  GLUCOSE 153* 135*  BUN 17 10  CREATININE 0.99 0.89  CALCIUM 8.6* 8.8*  AST 21  --   ALT 18  --   ALKPHOS 97  --   BILITOT 0.7  --    ------------------------------------------------------------------------------------------------------------------  Cardiac Enzymes No results for input(s): TROPONINI in the last 168 hours. ------------------------------------------------------------------------------------------------------------------  RADIOLOGY:  No results found.   ASSESSMENT AND PLAN:   57 yo male w/ hx of DM, HTN, hyperlipidemia, morbid obesity, ischemic cardiomyopathy, hypertension, COPD, chronic combined systolic diastolic CHF who presents to the hospital due to right foot pain noted to have a right diabetic foot ulcer.  1.  Right diabetic foot ulcer/osteomyelitis  POD #1 BKA Continue Zosyn and vancomycin for now.  Appreciate podiatry and vascular surgery input. Continue pain medications  2.  Diabetes type 2 without complication: Continue Lantus with sliding scale and ADA diet   3.  Tobacco dependence: Patient is encouraged to quit smoking. Counseling was provided for 4 minutes.  4.  Anxiety: Continue as needed Xanax.  5.  Hyperlipidemia: Continue atorvastatin.  6.  Neuropathy secondary to diabetes: Cntinue gabapentin.    All the records are reviewed and case discussed  with Care Management/Social Worker. Management plans discussed with the patient and he is  in agreement.  CODE STATUS: Full code  DVT Prophylaxis: Ted's and SCD's.   TOTAL TIME TAKING CARE OF THIS PATIENT: 23 minutes.   POSSIBLE D/C IN 2-3 DAYS, DEPENDING ON CLINICAL CONDITION. Await PT evaluation    Hernando Reali M.D on 04/02/2018 at 10:42 AM  Between 7am to 6pm - Pager - [phone]  After 6pm go to [email or web address] - [employer] [facility]  Office  [phone]  CC: Primary care physician; Clarisse Gouge, MD

## 2018-04-02 NOTE — Clinical Social Work Note (Signed)
Patient is currently being assessed for [facility]. CSW will pursue SNF if patient is turned down by [facility] and patient is in agreement.  National LCSW [phone]

## 2018-04-02 NOTE — Progress Notes (Signed)
Physical Therapy Evaluation Patient Details Name: [name] [name] MRN: [id number] DOB: Oct 23, 1960 Today's Date: 04/02/2018   History of Present Illness  57 y.o. male admitted for sepsis on 03/30/18. Pt has history of DM and has had multiple debridements on R foot. Imaging showed osteomyelitis of R foot. Prior to hospitalization had R BKA scheduled for 10/25, d/t worsening condition rescheduled and procedure was performed on 10/23. Pt's PMHx includes: DM, chronic diabetic foot ulcer, chronic systolic CHF, HTN, CAD, hyperlipidemia, COPD, and neuropathy. Per pt has had one fall in the past year where LLE gave out when stepping over the threshold into the home, no reports of injury.  Clinical Impression  Pt is a pleasant 57 year old male who was admitted for sepsis and is s/p R BKA. Pt performs bed mobility CGA-Min A and transfers with Mod A, +2 physical. Pt is very motivated and tolerates all treatments well, even with increased pain. Pt received education on positioning to promote knee extension and HEP including frequency and duration. Pt demonstrates deficits with strength, balance, mobility, sensation on plantar surface of L foot. Pt is not at his baseline. Would benefit from skilled PT to address above deficits and promote optimal return to PLOF.    Follow Up Recommendations CIR    Equipment Recommendations  Rolling walker with 5" wheels    Recommendations for Other Services OT consult     Precautions / Restrictions Precautions Precautions: Fall Restrictions Weight Bearing Restrictions: No Other Position/Activity Restrictions:  R BKA 04-01-18      Mobility  Bed Mobility Overal bed mobility: Needs Assistance Bed Mobility: Supine to Sit;Sit to Supine     Supine to sit: Min assist;Min guard;+2 for safety/equipment Sit to supine: Min guard   General bed mobility comments: Pt moves to EOB with Min A for trunk control initially, progress to sit EOB indep. Pt required VC for  sequencing.  Transfers Overall transfer level: Needs assistance Equipment used: None Transfers: Lateral/Scoot Transfers          Lateral/Scoot Transfers: +2 physical assistance;Mod assist General transfer comment: Pt unable to fully clear hips from bed using UE to support, but could slow shift hips. Pt fatigued and painful after 5 attempts to scoot to R, Mod A used to scoot remaining distance (12 inches).  Ambulation/Gait             General Gait Details: Unsafe to assess this date.  Stairs            Wheelchair Mobility    Modified Rankin (Stroke Patients Only)       Balance Overall balance assessment: Needs assistance Sitting-balance support: Bilateral upper extremity supported;Feet supported;No upper extremity supported Sitting balance-Leahy Scale: Fair Sitting balance - Comments: Pt initially had posterolateral lean toward R noted, but corrected indep without VC. Pt could sit extended duration with BUE support, but fatigued without UE support in ~30 sec                                      Pertinent Vitals/Pain Pain Assessment: 0-10 Pain Score: 10-Worst pain ever Pain Location: R knee and at stump Pain Descriptors / Indicators: Discomfort;Sore;Operative site guarding;Crushing Pain Intervention(s): Limited activity within patient's tolerance;Monitored during session;Patient requesting pain meds-RN notified    Home Living Family/patient expects to be discharged to:: Private residence Living Arrangements: Spouse/significant other(girlfriend) Available Help at Discharge: Friend(s);Available PRN/intermittently Type of Home: House Home Access:  Stairs to enter Entrance Stairs-Rails: None Entrance Stairs-Number of Steps: 3 Home Layout: One level Home Equipment: Walker - standard;Cane - single point;Wheelchair - [employer];Shower seat - built in;Shower seat      Prior Function Level of Independence: Independent with assistive  device(s)         Comments: Pt reports full community ambulation with single point cane. Endorses 2-3 falls in the last 12 months.  His girlfriend has overall deconditioning as well and cannot help tranfer or lift him.     Hand Dominance   Dominant Hand: Right    Extremity/Trunk Assessment   Upper Extremity Assessment Upper Extremity Assessment: Generalized weakness(RUE: 4+/5,  LUE: 4-/5)    Lower Extremity Assessment Lower Extremity Assessment: RLE deficits/detail;LLE deficits/detail RLE: Unable to fully assess due to pain LLE Deficits / Details: 4/5 LLE Sensation: history of peripheral neuropathy(decreased at first toe and across metatarsal joints.)       Communication   Communication: No difficulties  Cognition Arousal/Alertness: Awake/alert Behavior During Therapy: WFL for tasks assessed/performed Overall Cognitive Status: Within Functional Limits for tasks assessed                                        General Comments      Exercises Other Exercises Other Exercises: RLE AROM: quad sets, glute sets, isometric hip ABD, isometric hip ADD. LLE AROM: quad sets, glute sets, hip ABD/ADD, SLRs, heel slides. All ther-ex performed 10 reps with VC for technique. Other Exercises: Education for promoting knee ext, including how to position in bed. Other Exercises: Seated balance EOB 5 minutes intermittent Min A.   Assessment/Plan    PT Assessment Patient needs continued PT services  PT Problem List Decreased strength;Decreased activity tolerance;Decreased balance;Decreased mobility;Impaired sensation;Pain       PT Treatment Interventions DME instruction;Gait training;Stair training;Functional mobility training;Therapeutic activities;Therapeutic exercise;Balance training;Neuromuscular re-education;Patient/family education    PT Goals (Current goals can be found in the Care Plan section)  Acute Rehab PT Goals Patient Stated Goal: to regain as much  independence as possible and get a prosthesis PT Goal Formulation: With patient Time For Goal Achievement: 04/16/18 Potential to Achieve Goals: Good    Frequency 7X/week   Barriers to discharge        Co-evaluation               AM-PAC PT "6 Clicks" Daily Activity  Outcome Measure Difficulty turning over in bed (including adjusting bedclothes, sheets and blankets)?: A Little Difficulty moving from lying on back to sitting on the side of the bed? : Unable Difficulty sitting down on and standing up from a chair with arms (e.g., wheelchair, bedside commode, etc,.)?: Unable Help needed moving to and from a bed to chair (including a wheelchair)?: Total Help needed walking in hospital room?: Total Help needed climbing 3-5 steps with a railing? : Total 6 Click Score: 8    End of Session Equipment Utilized During Treatment: Oxygen(1 L/min) Activity Tolerance: Patient tolerated treatment well(Increased pain, but per pt tolerable.) Patient left: in bed;with call bell/phone within reach;with bed alarm set Nurse Communication: Mobility status PT Visit Diagnosis: Unsteadiness on feet (R26.81);Muscle weakness (generalized) (M62.81);Other (comment)(BKA)    Time: 1017-5102 PT Time Calculation (min) (ACUTE ONLY): 42 min   Charges:             Algis Downs, SPT  04/02/2018, 12:53 PM

## 2018-04-03 LAB — BASIC METABOLIC PANEL
Anion gap: 7 (ref 5–15)
BUN: 20 mg/dL (ref 6–20)
CO2: 30 mmol/L (ref 22–32)
Calcium: 8.2 mg/dL — ABNORMAL LOW (ref 8.9–10.3)
Chloride: 98 mmol/L (ref 98–111)
Creatinine, Ser: 1 mg/dL (ref 0.61–1.24)
GFR calc Af Amer: >60 mL/min (ref >60)
GFR calc non Af Amer: >60 mL/min (ref >60)
Glucose, Bld: 209 mg/dL — ABNORMAL HIGH (ref 70–99)
Potassium: 4.5 mmol/L (ref 3.5–5.1)
Sodium: 135 mmol/L (ref 135–145)

## 2018-04-03 LAB — CBC
HCT: 26.8 % — ABNORMAL LOW (ref 39.0–52.0)
Hemoglobin: 8 g/dL — ABNORMAL LOW (ref 13.0–17.0)
MCH: 20.9 pg — ABNORMAL LOW (ref 26.0–34.0)
MCHC: 29.9 g/dL — ABNORMAL LOW (ref 30.0–36.0)
MCV: 70 fL — ABNORMAL LOW (ref 80.0–100.0)
Platelets: 337 10*3/uL (ref 150–400)
RBC: 3.83 MIL/uL — ABNORMAL LOW (ref 4.22–5.81)
RDW: 20.2 % — ABNORMAL HIGH (ref 11.5–15.5)
WBC: 9.8 10*3/uL (ref 4.0–10.5)
nRBC: 0 % (ref 0.0–0.2)

## 2018-04-03 LAB — GLUCOSE, CAPILLARY
Glucose-Capillary: 166 mg/dL — ABNORMAL HIGH (ref 70–99)
Glucose-Capillary: 190 mg/dL — ABNORMAL HIGH (ref 70–99)
Glucose-Capillary: 195 mg/dL — ABNORMAL HIGH (ref 70–99)
Glucose-Capillary: 196 mg/dL — ABNORMAL HIGH (ref 70–99)

## 2018-04-03 LAB — SURGICAL PATHOLOGY

## 2018-04-03 LAB — MAGNESIUM: Magnesium: 2.1 mg/dL (ref 1.7–2.4)

## 2018-04-03 MED ORDER — INSULIN GLARGINE 100 UNIT/ML ~~LOC~~ SOLN
30.0000 [IU] | Freq: Every day | SUBCUTANEOUS | Status: DC
  Administered 2018-04-03 – 2018-04-06 (×4): 30 [IU] via SUBCUTANEOUS
  Filled 2018-04-03 (×5): qty 0.3

## 2018-04-03 MED ORDER — INSULIN ASPART 100 UNIT/ML ~~LOC~~ SOLN
6.0000 [IU] | Freq: Three times a day (TID) | SUBCUTANEOUS | Status: DC
  Administered 2018-04-03 – 2018-04-07 (×11): 6 [IU] via SUBCUTANEOUS
  Filled 2018-04-03 (×10): qty 1

## 2018-04-03 MED ORDER — BISACODYL 10 MG RE SUPP: 10.0000 mg | Freq: Every day | RECTAL | Status: DC

## 2018-04-03 MED ORDER — FLEET ENEMA 7-19 GM/118ML RE ENEM: 1.0000 | ENEMA | Freq: Every day | RECTAL | Status: DC | PRN

## 2018-04-03 MED ORDER — SENNA 8.6 MG PO TABS
1.0000 | ORAL_TABLET | Freq: Every day | ORAL | Status: DC
  Administered 2018-04-03 – 2018-04-04 (×2): 8.6 mg via ORAL
  Filled 2018-04-03 (×2): qty 1

## 2018-04-03 NOTE — Clinical Social Work Note (Signed)
RN CM has informed CSW that if CIR does not accept him he will return home. CSW will not work up for SNF at this time. CIR has informed RN CM that they will not know until Monday. Shela Leff MSW,LCSW [phone]

## 2018-04-03 NOTE — Progress Notes (Addendum)
[facility] [place] NAME: [name] [name]    MR#:  [id number]  DATE OF BIRTH:  05/18/1961  SUBJECTIVE:   Pain is better controlled but did not have a bowel movement yesterday.  No abdominal pain or nausea.  REVIEW OF SYSTEMS:    Review of Systems  Constitutional: Negative for chills and fever.  HENT: Negative for congestion and tinnitus.   Eyes: Negative for blurred vision and double vision.  Respiratory: Negative for cough, shortness of breath and wheezing.   Cardiovascular: Negative for chest pain, orthopnea and PND.  Gastrointestinal: Negative for abdominal pain, diarrhea, nausea and vomiting.  Genitourinary: Negative for dysuria and hematuria.  Musculoskeletal: Negative for joint pain (right foot stump better pain).  Neurological: Negative for dizziness, sensory change and focal weakness.  Psychiatric/Behavioral: Negative for substance abuse.  All other systems reviewed and are negative.     DRUG ALLERGIES:   Allergies  Allergen Reactions  . Dulaglutide Anaphylaxis, Diarrhea and Hives  . Prednisone Rash    VITALS:  Blood pressure 128/89, pulse 98, temperature 97.9 F (36.6 C), temperature source Oral, resp. rate (!) 24, height 5\' 7"  (1.702 m), weight 99.8 kg, SpO2 92 %.  PHYSICAL EXAMINATION:   Physical Exam  GENERAL:  57 y.o.-year-old patient lying in bed a bit anxious but in NAD.  EYES: Pupils equal, round, reactive to light and accommodation. No scleral icterus. Extraocular muscles intact.  HEENT: Head atraumatic, normocephalic. Oropharynx and nasopharynx clear.  NECK:  Supple, no jugular venous distention. No thyroid enlargement, no tenderness.  LUNGS: Normal breath sounds bilaterally, no wheezing, rales, rhonchi. No use of accessory muscles of respiration.  CARDIOVASCULAR: S1, S2 normal. No murmurs, rubs, or gallops.  ABDOMEN: Soft, nontender, nondistended. Bowel sounds present. No organomegaly or mass.    EXTREMITIES: No cyanosis, clubbing or edema b/l.   Right foot dressing in place with whole foot covered.  No drainage/bleeding. NEUROLOGIC: Cranial nerves II through XII are intact. No focal Motor or sensory deficits b/l.   PSYCHIATRIC: The patient is alert and oriented x 3.  SKIN: No obvious rash, lesion, or ulcer.    LABORATORY PANEL:   CBC Recent Labs  Lab 04/03/18 0609  WBC 9.8  HGB 8.0*  HCT 26.8*  PLT 337   ------------------------------------------------------------------------------------------------------------------  Chemistries  Recent Labs  Lab 03/31/18 0606  04/03/18 0609  NA 134*   < > 135  K 5.0   < > 4.5  CL 95*   < > 98  CO2 27   < > 30  GLUCOSE 153*   < > 209*  BUN 17   < > 20  CREATININE 0.99   < > 1.00  CALCIUM 8.6*   < > 8.2*  MG  --   --  2.1  AST 21  --   --   ALT 18  --   --   ALKPHOS 97  --   --   BILITOT 0.7  --   --    < > = values in this interval not displayed.   ------------------------------------------------------------------------------------------------------------------  Cardiac Enzymes No results for input(s): TROPONINI in the last 168 hours. ------------------------------------------------------------------------------------------------------------------  RADIOLOGY:  No results found.   ASSESSMENT AND PLAN:   57 yo male w/ hx of DM, HTN, hyperlipidemia, morbid obesity, ischemic cardiomyopathy, hypertension, COPD, chronic combined systolic diastolic CHF who presents to the hospital due to right foot pain noted to have a right diabetic foot ulcer.  1.  Right diabetic foot ulcer/osteomyelitis  He is POD #2 BKA pain is better controlled. Antibiotics stopped since he has had BKA. Plan for dressing change tomorrow Physical therapy has recommended inpatient rehab   Appreciate podiatry and vascular surgery input.   2.  Diabetes type 2 without complication: Continue Lantus with sliding scale and ADA diet   3.  Tobacco  dependence: Patient is encouraged to quit smoking. Counseling was provided..  4.  Anxiety: Continue as needed Xanax.  5.  Hyperlipidemia: Continue atorvastatin.  6.  Neuropathy secondary to diabetes: Cntinue gabapentin.  7.  Constipation: Order aggressive stool softener since patient is on narcotics  8.  Acute on chronic anemia: Acute blood loss from surgery No indication for transfusion CBC for a.m.   All the records are reviewed and case discussed with Care Management/Social Worker. Management plans discussed with the patient and he is  in agreement.  CODE STATUS: Full code  DVT Prophylaxis: Ted's and SCD's.   TOTAL TIME TAKING CARE OF THIS PATIENT: 23 minutes.   POSSIBLE D/C IN 1-3 DAYS, DEPENDING ON CLINICAL CONDITION.   PT RECS CIR awaiting approval    Thoma Paulsen M.D on 04/03/2018 at 10:19 AM  Between 7am to 6pm - Pager - [phone]  After 6pm go to [email or web address] - [employer] [facility]  Office  [phone]  CC: Primary care physician; Clarisse Gouge, MD

## 2018-04-03 NOTE — Progress Notes (Signed)
[name] [name]  Daily Progress Note   Subjective: 2 Days Post-Op: Right below-the-knee amputation  Patient still complaining of incisional stump pain.  Notes his pain is slightly improved with the changes I made to his pain medication regimen yesterday.  No issues overnight.  Awaiting rehab consultation/recommendations.   Objective: Vitals:   04/02/18 0806 04/02/18 1134 04/02/18 1957 04/03/18 0515  BP: 130/82 129/83 121/80 128/89  Pulse: (!) 107 (!) 108 99 98  Resp: 18 17 20  (!) 24  Temp:  97.6 F (36.4 C) 98 F (36.7 C) 97.9 F (36.6 C)  TempSrc:  Oral Oral Oral  SpO2: 99% 96% 96% 92%  Weight:      Height:        Intake/Output Summary (Last 24 hours) at 04/03/2018 1039 Last data filed at 04/03/2018 1037 Gross per 24 hour  Intake 1438.45 ml  Output 1760 ml  Net -321.55 ml   Physical Exam: A&Ox3, NAD CV: RRR Pulmonary: CTA Bilaterally Abdomen: Soft, Nontender, Nondistended Vascular:  Right Lower Extremity: OR dressing removed. Stump healing well. Staples clean, dry and  intact. Thigh soft. No erythema or infection noted to stump.   Laboratory: CBC    Component Value Date/Time   WBC 9.8 04/03/2018 0609   HGB 8.0 (L) 04/03/2018 0609   HGB 15.0 06/08/2014 1037   HCT 26.8 (L) 04/03/2018 0609   HCT 44.5 06/08/2014 1037   PLT 337 04/03/2018 0609   PLT 261 06/08/2014 1037   BMET    Component Value Date/Time   NA 135 04/03/2018 0609   NA 132 (L) 01/29/2018 1602   NA 136 06/08/2014 1037   K 4.5 04/03/2018 0609   K 4.3 06/08/2014 1037   CL 98 04/03/2018 0609   CL 101 06/08/2014 1037   CO2 30 04/03/2018 0609   CO2 27 06/08/2014 1037   GLUCOSE 209 (H) 04/03/2018 0609   GLUCOSE 415 (H) 06/08/2014 1037   BUN 20 04/03/2018 0609   BUN 9 01/29/2018 1602   BUN 9 06/08/2014 1037   CREATININE 1.00 04/03/2018 0609   CREATININE 0.87 02/05/2018 1417   CALCIUM 8.2 (L) 04/03/2018 0609   CALCIUM 9.2 06/08/2014 1037   GFRNONAA >60 04/03/2018 0609   GFRNONAA >60 06/08/2014 1037   GFRNONAA >60 02/17/2014 0909   GFRAA >60 04/03/2018 0609   GFRAA >60 06/08/2014 1037   GFRAA >60 02/17/2014 0909   Assessment/Planning: The patient is a 57 year old male with severe peripheral artery disease status post a right below the knee amputation postop day 2 - Stable 1) OR dressing was changed on April 03, 2018 by surgical staff.  This should be changed every 1 to 2 days or sooner if drainage is noted. 2) Appreciate diabetes recommendations.  Changes have been made to the patient's medications. 3) 1/2 gram drop in hemoglobin this a.m.  There is no active bleeding from the stump site during dressing change.  Continue to follow - CBC in AM 4) From a vascular [name] standpoint the patient is ready for discharge when medicine feels appropriate and the patient has been accepted into a rehab facility.  Discussed with Dr. Ellis Parents [name] PA-C 04/03/2018 10:39 AM

## 2018-04-03 NOTE — Progress Notes (Signed)
Physical Therapy Treatment Patient Details Name: [name] [name] MRN: [id number] DOB: [birth year] Today's Date: 04/03/2018    History of Present Illness 57 y.o. male admitted for sepsis on 03/30/18. Pt has history of DM and has had multiple debridements on R foot. Imaging showed osteomyelitis of R foot. Prior to hospitalization had R BKA scheduled for 10/25, d/t worsening condition rescheduled and procedure was performed on 10/23. Pt's PMHx includes: DM, chronic diabetic foot ulcer, chronic systolic CHF, HTN, CAD, hyperlipidemia, COPD, and neuropathy. Per pt has had one fall in the past year where LLE gave out when stepping over the threshold into the home, no reports of injury.    PT Comments    Pt is progressing toward goals. Pt educated on HEP including frequency and duration, visual aid provided. Pt tolerates ther-ex with increased pain in RLE. He demonstrates improved bed mobility requiring HHA to come to EOB. Progressed pt to standing using RW for UE support and Mod A, +2 physical and VC for sequencing. No noted knee buckling in stance, progressed to Min A on last (3rd) sit-to-stand attempt. Pt also performed a  stand pivot transfer toward L side with Mod A, +2, relying heavily on UEs to WB. Noted increased trunk flexion during transfer and overall transfer was very effortful. Pt unable to hop for transfer at this time. Pt very fatigued and reported increased pain rating 10. Per patient phantom limb sx present t/o session, particularly with transfers. Will continue to progress pt's strength and mobility as able. PT continues to recommend transition to CIR upon discharge of acute hospitalization.   Follow Up Recommendations  CIR     Equipment Recommendations  Rolling walker with 5" wheels    Recommendations for Other Services       Precautions / Restrictions Precautions Precautions: Fall Restrictions Weight Bearing Restrictions: Yes    Mobility  Bed Mobility Overal bed mobility:  Needs Assistance Bed Mobility: Supine to Sit     Supine to sit: Min assist     General bed mobility comments: Pt requires HHA to come to EOB. Initial dizziness at EOB that subsides with rest  Transfers Overall transfer level: Needs assistance Equipment used: Rolling walker (2 wheeled) Transfers: Sit to/from [facility] Sit to Stand: Mod assist;Min assist;+2 safety/equipment Stand pivot transfers: Mod assist;+2 physical assistance       General transfer comment: Pt able to perform 3xSTS with RW for UE support and Mod A ,+2 physical, PT blocks L knee no noted knee buckle. Pt also performed 1 stand-pivot transfer toward L, again L knee blocked by PT, no buckle noted, relied heavily on UE to WB when on ball of L foot.   Ambulation/Gait             General Gait Details: Unsafe to perform this date.   Stairs             Wheelchair Mobility    Modified Rankin (Stroke Patients Only)       Balance Overall balance assessment: Needs assistance Sitting-balance support: Bilateral upper extremity supported;Feet supported;No upper extremity supported Sitting balance-Leahy Scale: Fair Sitting balance - Comments: Pt has improved tolerance sitting at EOB able to sit >2 minutes without support.   Standing balance support: Bilateral upper extremity supported Standing balance-Leahy Scale: Poor Standing balance comment: Unable to stand without Min A +2, noted flexed trunk posturing  Cognition Arousal/Alertness: Awake/alert Behavior During Therapy: WFL for tasks assessed/performed Overall Cognitive Status: Within Functional Limits for tasks assessed                                        Exercises Other Exercises Other Exercises: Pt performs ther-ex supine. BLE AROM: quad sets, glute sets, isometric hip ADD. LLE AROM: SLR, hip ABD/ADD. RLE AROM: isometric hip ABD. All ther-ex performed 10 reps with VC for  technique. Other Exercises: Education for promoting knee ext, including how to position in bed. Other Exercises: Education on HEP    General Comments        Pertinent Vitals/Pain Pain Assessment: 0-10 Pain Score: 8  Pain Location: R knee and at stump Pain Descriptors / Indicators: Discomfort;Sore;Operative site guarding;Crushing Pain Intervention(s): Limited activity within patient's tolerance;Monitored during session    Home Living                      Prior Function            PT Goals (current goals can now be found in the care plan section) Acute Rehab PT Goals Patient Stated Goal: to regain as much independence as possible and get a prosthesis PT Goal Formulation: With patient Time For Goal Achievement: 04/16/18 Potential to Achieve Goals: Good Progress towards PT goals: Progressing toward goals    Frequency    7X/week      PT Plan Current plan remains appropriate    Co-evaluation              AM-PAC PT "6 Clicks" Daily Activity  Outcome Measure  Difficulty turning over in bed (including adjusting bedclothes, sheets and blankets)?: A Little Difficulty moving from lying on back to sitting on the side of the bed? : Unable Difficulty sitting down on and standing up from a chair with arms (e.g., wheelchair, bedside commode, etc,.)?: Unable Help needed moving to and from a bed to chair (including a wheelchair)?: A Lot Help needed walking in hospital room?: Total Help needed climbing 3-5 steps with a railing? : Total 6 Click Score: 9    End of Session Equipment Utilized During Treatment: Gait belt Activity Tolerance: Patient tolerated treatment well Patient left: in chair;with call bell/phone within reach;with chair alarm set;with nursing/sitter in room Nurse Communication: Mobility status PT Visit Diagnosis: Unsteadiness on feet (R26.81);Muscle weakness (generalized) (M62.81);Other (comment)     Time: 2122-4825 PT Time Calculation (min)  (ACUTE ONLY): 33 min  Charges:                       Algis Downs, SPT  04/03/2018, 12:52 PM

## 2018-04-03 NOTE — Progress Notes (Signed)
Inpatient Diabetes Program Recommendations  AACE/ADA: New Consensus Statement on Inpatient Glycemic Control (2015)  Target Ranges:  Prepandial:   less than 140 mg/dL      Peak postprandial:   less than 180 mg/dL (1-2 hours)      Critically ill patients:  140 - 180 mg/dL   Results for [name] [name], [name] [name] (MRN [id number]) as of 04/03/2018 12:21  Ref. Range 04/02/2018 07:39 04/02/2018 11:34 04/02/2018 16:21 04/02/2018 20:53  Glucose-Capillary Latest Ref Range: 70 - 99 mg/dL 287 (H)  5 units NOVOLOG  354 (H)  9 units NOVOLOG  151 (H)  6 units NOVOLOG  161 (H)    25 units LANTUS   Results for [name], [name] (MRN [id number]) as of 04/03/2018 12:21  Ref. Range 04/03/2018 07:54 04/03/2018 11:29  Glucose-Capillary Latest Ref Range: 70 - 99 mg/dL 196 (H)  6 units NOVOLOG  190 (H)  6 units NOVOLOG     Admit:Sepsis from right foot osteomyelitis  History: DM, CHF, COPD  HomeDM Meds: Lantus 70unitsQHS (Pt NOT taking)  Metformin 500 mg Daily  Current Orders: Lantus 25 units QHS                            Novolog Sensitive Correction Scale/ SSI (0-9 units) TID AC + HS      Novolog 4 units TID with meals       MD- Please consider the following in-hospital insulin adjustments:  1.  Increase Lantus slightly to 30 units QHS  2. Increase Novolog Meal Coverage slightly to: Novolog 6 units TID with meals     Per records, patient NO longer taking Lantus at home, however, per last visit with Primary MD (Dr. Vickki Muff with [facility]) on 03/19/2018, patient is supposed to be taking Lantus 40 units BID + Metformin 1000 mg BID.    Spoke with patient today and he self stopped the Lantus at home.  Was taking Lantus 40 units AM/ 20 units in the PM up until about 1 month ago.  Encouraged pt to follow up with Dr. Vickki Muff (PCP) after d/c to discuss home DM meds.  Encouraged pt to check CBGs at least BID (either before meals or 2 hour after  meals and reviewed CBG goals with pt).       --Will follow patient during hospitalization--  Wyn Quaker RN, MSN, CDE Diabetes Coordinator Inpatient Glycemic Control Team Team Pager: [phone] (8a-5p)

## 2018-04-03 NOTE — Progress Notes (Signed)
Inpatient Rehabilitation-Admissions Coordinator   Spoke with pt and his significant other over the phone to confirm both interest in CIR as well as support at DC. Pt wanting to pursue CIR at this time. AC has discussed program details as well as expectations, estimated length of stay, and anticipated functional level at DC. Pt educated that [facility] will monitor for tolerance with therapies as he is weaned off IV pain medications. Pt verbalized understanding. AC will follow up with pt on Monday.   Please call if questions.   Jhonnie Garner, OTR/L  Rehab Admissions Coordinator  [phone] 04/03/2018 5:18 PM

## 2018-04-03 NOTE — Care Management (Signed)
Patient admitted from home.  2 Days Post-Op: Right below-the-knee amputation.  RNCM spoke with patient and girlfriend at bedside.  Patient is wishing to pursue CIR.  Jhonnie Garner Admissions coordinator is following case.  Per Claiborne Billings on Monday the case will be looked at again to determine if patient is able to wean off pain IV pain medication and continue to work with PT.  Per Claiborne Billings the earliest patient would be accepted at CIR would be Monday.   Patient is currently open with [facility].  Corene Cornea with [facility] aware of admission.  Patient states that at home he has a RW, and crutches.  PCP [facility].  Pharmacy [facility].  Girlfriend provides transportation.  Patient denies issues obtaining medications, or with transportation.  RNCM discussed options if CIR is not approved.  After discussing the option of SNF, patient states that if CIR is denied he is not interested in pursuing SNF.  CSW notified. RNCM following

## 2018-04-04 LAB — BASIC METABOLIC PANEL
Anion gap: 7 (ref 5–15)
BUN: 25 mg/dL — ABNORMAL HIGH (ref 6–20)
CO2: 29 mmol/L (ref 22–32)
Calcium: 8.3 mg/dL — ABNORMAL LOW (ref 8.9–10.3)
Chloride: 96 mmol/L — ABNORMAL LOW (ref 98–111)
Creatinine, Ser: 0.96 mg/dL (ref 0.61–1.24)
GFR calc Af Amer: >60 mL/min (ref >60)
GFR calc non Af Amer: >60 mL/min (ref >60)
Glucose, Bld: 168 mg/dL — ABNORMAL HIGH (ref 70–99)
Potassium: 4.8 mmol/L (ref 3.5–5.1)
Sodium: 132 mmol/L — ABNORMAL LOW (ref 135–145)

## 2018-04-04 LAB — GLUCOSE, CAPILLARY
Glucose-Capillary: 159 mg/dL — ABNORMAL HIGH (ref 70–99)
Glucose-Capillary: 201 mg/dL — ABNORMAL HIGH (ref 70–99)
Glucose-Capillary: 166 mg/dL — ABNORMAL HIGH (ref 70–99)
Glucose-Capillary: 115 mg/dL — ABNORMAL HIGH (ref 70–99)

## 2018-04-04 LAB — CBC
HCT: 27.5 % — ABNORMAL LOW (ref 39.0–52.0)
Hemoglobin: 8.1 g/dL — ABNORMAL LOW (ref 13.0–17.0)
MCH: 20.8 pg — ABNORMAL LOW (ref 26.0–34.0)
MCHC: 29.5 g/dL — ABNORMAL LOW (ref 30.0–36.0)
MCV: 70.7 fL — ABNORMAL LOW (ref 80.0–100.0)
Platelets: 353 10*3/uL (ref 150–400)
RBC: 3.89 MIL/uL — ABNORMAL LOW (ref 4.22–5.81)
RDW: 20.4 % — ABNORMAL HIGH (ref 11.5–15.5)
WBC: 8.3 10*3/uL (ref 4.0–10.5)
nRBC: 0 % (ref 0.0–0.2)

## 2018-04-04 LAB — CULTURE, BLOOD (ROUTINE X 2)
Culture: NO GROWTH
Culture: NO GROWTH
Special Requests: ADEQUATE

## 2018-04-04 LAB — MAGNESIUM: Magnesium: 2.1 mg/dL (ref 1.7–2.4)

## 2018-04-04 MED ORDER — SENNA 8.6 MG PO TABS: 1.0000 | ORAL_TABLET | Freq: Every day | ORAL | Status: DC

## 2018-04-04 MED ORDER — POLYETHYLENE GLYCOL 3350 17 G PO PACK
17.0000 g | PACK | Freq: Every day | ORAL | Status: DC
  Administered 2018-04-04: 17 g via ORAL
  Filled 2018-04-04: qty 1

## 2018-04-04 MED ORDER — BISACODYL 10 MG RE SUPP: 10.0000 mg | Freq: Every day | RECTAL | Status: DC

## 2018-04-04 MED ORDER — HYDROCODONE-ACETAMINOPHEN 10-325 MG PO TABS
1.0000 | ORAL_TABLET | ORAL | Status: DC | PRN
  Administered 2018-04-04 – 2018-04-06 (×11): 2 via ORAL
  Administered 2018-04-07: 1 via ORAL
  Administered 2018-04-07 (×2): 2 via ORAL
  Filled 2018-04-04 (×9): qty 2
  Filled 2018-04-04: qty 1
  Filled 2018-04-04 (×5): qty 2

## 2018-04-04 NOTE — Progress Notes (Signed)
PT Cancellation Note  Patient Details Name: [name] [name] MRN: [id number] DOB: 08/10/1960   Cancelled Treatment:    Reason Eval/Treat Not Completed: Patient declined, no reason specified.  Pt refused, stating that he is in too much pain.  RN advised that pt is not able to receive any more pain medication at this time and that the physician is currently working on a more effective pain control method for pt.  Will re-attempt later when pt is more appropriate.   Roxanne Gates, PT, DPT 04/04/2018, 10:53 AM

## 2018-04-04 NOTE — Progress Notes (Signed)
Occupational Therapy Treatment Patient Details Name: [name] [name] MRN: [id number] DOB: Apr 26, 1961 Today's Date: 04/04/2018    History of present illness 57 y.o. male admitted for sepsis on 03/30/18. Pt has history of DM and has had multiple debridements on R foot. Imaging showed osteomyelitis of R foot. Prior to hospitalization had R BKA scheduled for 10/25, d/t worsening condition rescheduled and procedure was performed on 10/23. Pt's PMHx includes: DM, chronic diabetic foot ulcer, chronic systolic CHF, HTN, CAD, hyperlipidemia, COPD, and neuropathy. Per pt has had one fall in the past year where LLE gave out when stepping over the threshold into the home, no reports of injury.   OT comments  Patient lying in bed when OT arrived. Eager to work with OT on BUE strengthening. Provided handout and education using Green Theraband. Patient pain was 6/10 initially, with patient noting phantom pain causing itch and sore ankle on the missing limb. Educated patient on techniques for managing phantom limb pain. Patient performed and stated it helped a little. Also educated on limb management for eventual prosthetic fitting. Including working on Lexicographer at this time. Patient verbalized understanding. At the end of session, patient stated the BUE strengthening made him feel better, and also decreased the pain a little in RLE. Nursing arrived at end of session and was made aware. Patient would benefit from continued OT to increase functional independence with ADL tasks.   Follow Up Recommendations  CIR    Equipment Recommendations       Recommendations for Other Services      Precautions / Restrictions Precautions Precautions: Fall Restrictions Weight Bearing Restrictions: Yes Other Position/Activity Restrictions:  R BKA 04-01-18       Mobility Bed Mobility                  Transfers                      Balance                                           ADL either performed or assessed with clinical judgement   ADL                                         General ADL Comments: Initiated sensory training for area above amputation and eventually once healed the residual limb area.  Also discussed his desire to have prosthetic limb in future and importance of stump shaping and how to achieve this.     Vision Baseline Vision/History: No visual deficits Patient Visual Report: No change from baseline     Perception     Praxis      Cognition Arousal/Alertness: Awake/alert Behavior During Therapy: WFL for tasks assessed/performed Overall Cognitive Status: Within Functional Limits for tasks assessed                                          Exercises General Exercises - Upper Extremity Shoulder Flexion: AROM;10 reps;Theraband;Both;Strengthening Theraband Level (Shoulder Flexion): Level 3 (Green) Shoulder Extension: AROM;10 reps;Both;Theraband;Strengthening Theraband Level (Shoulder Extension): Level 3 (Green) Shoulder ABduction: AROM;10 reps;Theraband;Both;Strengthening Theraband Level (Shoulder Abduction): Level 3 (Green) Elbow Flexion:  AROM;10 reps;Theraband;Both;Strengthening Theraband Level (Elbow Flexion): Level 3 (Green)   Shoulder Instructions       General Comments      Pertinent Vitals/ Pain       Pain Assessment: 0-10 Pain Score: 6  Pain Location: R knee and at stump Pain Descriptors / Indicators: Discomfort;Sore;Operative site guarding;Crushing Pain Intervention(s): Monitored during session;Limited activity within patient's tolerance  Home Living Family/patient expects to be discharged to:: Private residence Living Arrangements: Spouse/significant other                                      Prior Functioning/Environment              Frequency  Min 3X/week        Progress Toward Goals  OT Goals(current goals can now be found in the care plan  section)  Progress towards OT goals: Progressing toward goals  Acute Rehab OT Goals Patient Stated Goal: to regain as much independence as possible and get a prosthesis OT Goal Formulation: With patient Time For Goal Achievement: 04/16/18 Potential to Achieve Goals: Good  Plan Discharge plan remains appropriate;Frequency remains appropriate    Co-evaluation                 AM-PAC PT "6 Clicks" Daily Activity     Outcome Measure                    End of Session    OT Visit Diagnosis: Pain;Muscle weakness (generalized) (M62.81) Pain - Right/Left: Right Pain - part of body: Knee   Activity Tolerance     Patient Left in bed;with call bell/phone within reach;with bed alarm set;with family/visitor present   Nurse Communication          Time: 1610-9604 OT Time Calculation (min): 35 min  Charges: OT General Charges $OT Visit: 1 Visit OT Treatments $Therapeutic Exercise: 23-37 mins  [name] [name], OTR/L   [name] [name] L 04/04/2018, 5:57 PM

## 2018-04-04 NOTE — Progress Notes (Signed)
[facility] [place] NAME: [name] [name]    MR#:  [id number]  DATE OF BIRTH:  02-26-1961  SUBJECTIVE:   No bowel movement past 2 days.  Patient continues to have pain and feels oxycodone is not helping  REVIEW OF SYSTEMS:    Review of Systems  Constitutional: Negative for chills and fever.  HENT: Negative for congestion and tinnitus.   Eyes: Negative for blurred vision and double vision.  Respiratory: Negative for cough, shortness of breath and wheezing.   Cardiovascular: Negative for chest pain, orthopnea and PND.  Gastrointestinal: Positive for constipation. Negative for abdominal pain, diarrhea, nausea and vomiting.  Genitourinary: Negative for dysuria and hematuria.  Musculoskeletal: Positive for joint pain.  Neurological: Negative for dizziness, sensory change and focal weakness.  Psychiatric/Behavioral: Negative for substance abuse.  All other systems reviewed and are negative.     DRUG ALLERGIES:   Allergies  Allergen Reactions  . Dulaglutide Anaphylaxis, Diarrhea and Hives  . Prednisone Rash    VITALS:  Blood pressure 121/83, pulse 98, temperature 97.8 F (36.6 C), resp. rate 20, height 5\' 7"  (1.702 m), weight 99.8 kg, SpO2 96 %.  PHYSICAL EXAMINATION:   Physical Exam  GENERAL:  57 y.o.-year-old patient lying in bed a bit anxious but in NAD.  EYES: Pupils equal, round, reactive to light and accommodation. No scleral icterus. Extraocular muscles intact.  HEENT: Head atraumatic, normocephalic. Oropharynx and nasopharynx clear.  NECK:  Supple, no jugular venous distention. No thyroid enlargement, no tenderness.  LUNGS: Normal breath sounds bilaterally, no wheezing, rales, rhonchi. No use of accessory muscles of respiration.  CARDIOVASCULAR: S1, S2 normal. No murmurs, rubs, or gallops.  ABDOMEN: Soft, nontender, nondistended. Bowel sounds present. No organomegaly or mass.  EXTREMITIES: No cyanosis, clubbing or edema  b/l.   Right foot dressing in place with whole foot covered.  No drainage/bleeding. NEUROLOGIC: Cranial nerves II through XII are intact. No focal Motor or sensory deficits b/l.   PSYCHIATRIC: The patient is alert and oriented x 3.  SKIN: No obvious rash, lesion, or ulcer.    LABORATORY PANEL:   CBC Recent Labs  Lab 04/04/18 0621  WBC 8.3  HGB 8.1*  HCT 27.5*  PLT 353   ------------------------------------------------------------------------------------------------------------------  Chemistries  Recent Labs  Lab 03/31/18 0606  04/04/18 0621  NA 134*   < > 132*  K 5.0   < > 4.8  CL 95*   < > 96*  CO2 27   < > 29  GLUCOSE 153*   < > 168*  BUN 17   < > 25*  CREATININE 0.99   < > 0.96  CALCIUM 8.6*   < > 8.3*  MG  --    < > 2.1  AST 21  --   --   ALT 18  --   --   ALKPHOS 97  --   --   BILITOT 0.7  --   --    < > = values in this interval not displayed.   ------------------------------------------------------------------------------------------------------------------  Cardiac Enzymes No results for input(s): TROPONINI in the last 168 hours. ------------------------------------------------------------------------------------------------------------------  RADIOLOGY:  No results found.   ASSESSMENT AND PLAN:   57 yo male w/ hx of DM, HTN, hyperlipidemia, morbid obesity, ischemic cardiomyopathy, hypertension, COPD, chronic combined systolic diastolic CHF who presents to the hospital due to right foot pain noted to have a right diabetic foot ulcer.  1.  Right diabetic foot ulcer/osteomyelitis  He is POD #3 BKA  Antibiotics stopped since he has had BKA. Plan for dressing change if soiled as per vascular surgery.   Continue PRN pain medications Physical therapy has recommended inpatient rehab   Will have outpatient vascular surgery appointment in 1 week post discharge.  2.  Diabetes type 2: Continue Lantus with sliding scale and ADA diet   3.  Tobacco  dependence: Patient is encouraged to quit smoking. Counseling was provided..  4.  Anxiety: Continue as needed Xanax.  5.  Hyperlipidemia: Continue atorvastatin.  6.  Neuropathy secondary to diabetes: Cntinue gabapentin.  7.  Constipation: Order aggressive stool softener since patient is on narcotics  8.  Acute on chronic anemia: Acute blood loss from surgery No indication for transfusion Hemoglobin is stable at 8.1 this morning.   We are waiting to hear from CIR which will be Monday.  All the records are reviewed and case discussed with Care Management/Social Worker. Management plans discussed with the patient and he is  in agreement.  CODE STATUS: Full code  DVT Prophylaxis: Ted's and SCD's.   TOTAL TIME TAKING CARE OF THIS PATIENT: 23 minutes.   POSSIBLE D/C IN 2-3 DAYS, DEPENDING ON CLINICAL CONDITION.   PT RECS CIR awaiting approval    Thomes Burak M.D on 04/04/2018 at 10:29 AM  Between 7am to 6pm - Pager - [phone]  After 6pm go to [email or web address] - Technical brewer [facility]  Office  [phone]  CC: Primary care physician; Clarisse Gouge, MD

## 2018-04-04 NOTE — Plan of Care (Signed)
Patient will benefit from ongoing skilled OT services in acute care setting to continue to advance safe functional mobility, BUE strengthening, and increased ADL independence, and minimize fall risk.

## 2018-04-05 LAB — CBC
HCT: 28.2 % — ABNORMAL LOW (ref 39.0–52.0)
Hemoglobin: 8.3 g/dL — ABNORMAL LOW (ref 13.0–17.0)
MCH: 20.8 pg — ABNORMAL LOW (ref 26.0–34.0)
MCHC: 29.4 g/dL — ABNORMAL LOW (ref 30.0–36.0)
MCV: 70.7 fL — ABNORMAL LOW (ref 80.0–100.0)
Platelets: 345 10*3/uL (ref 150–400)
RBC: 3.99 MIL/uL — ABNORMAL LOW (ref 4.22–5.81)
RDW: 20.6 % — ABNORMAL HIGH (ref 11.5–15.5)
WBC: 8.6 10*3/uL (ref 4.0–10.5)
nRBC: 0.2 % (ref 0.0–0.2)

## 2018-04-05 LAB — BASIC METABOLIC PANEL
Anion gap: 9 (ref 5–15)
BUN: 25 mg/dL — ABNORMAL HIGH (ref 6–20)
CO2: 28 mmol/L (ref 22–32)
Calcium: 8.2 mg/dL — ABNORMAL LOW (ref 8.9–10.3)
Chloride: 93 mmol/L — ABNORMAL LOW (ref 98–111)
Creatinine, Ser: 1.24 mg/dL (ref 0.61–1.24)
GFR calc Af Amer: >60 mL/min (ref >60)
GFR calc non Af Amer: >60 mL/min (ref >60)
Glucose, Bld: 182 mg/dL — ABNORMAL HIGH (ref 70–99)
Potassium: 5.6 mmol/L — ABNORMAL HIGH (ref 3.5–5.1)
Sodium: 130 mmol/L — ABNORMAL LOW (ref 135–145)

## 2018-04-05 LAB — GLUCOSE, CAPILLARY
Glucose-Capillary: 178 mg/dL — ABNORMAL HIGH (ref 70–99)
Glucose-Capillary: 164 mg/dL — ABNORMAL HIGH (ref 70–99)
Glucose-Capillary: 133 mg/dL — ABNORMAL HIGH (ref 70–99)
Glucose-Capillary: 170 mg/dL — ABNORMAL HIGH (ref 70–99)
Glucose-Capillary: 129 mg/dL — ABNORMAL HIGH (ref 70–99)
Glucose-Capillary: 240 mg/dL — ABNORMAL HIGH (ref 70–99)

## 2018-04-05 LAB — MAGNESIUM: Magnesium: 2.3 mg/dL (ref 1.7–2.4)

## 2018-04-05 MED ORDER — METHYLNALTREXONE BROMIDE 12 MG/0.6ML ~~LOC~~ SOLN
12.0000 mg | SUBCUTANEOUS | Status: DC
  Administered 2018-04-06: 12 mg via SUBCUTANEOUS
  Filled 2018-04-05 (×2): qty 0.6

## 2018-04-05 MED ORDER — SODIUM POLYSTYRENE SULFONATE 15 GM/60ML PO SUSP
30.0000 g | Freq: Once | ORAL | Status: AC
  Administered 2018-04-05: 30 g via ORAL
  Filled 2018-04-05: qty 120

## 2018-04-05 MED ORDER — FLEET ENEMA 7-19 GM/118ML RE ENEM: 1.0000 | ENEMA | Freq: Once | RECTAL | Status: DC

## 2018-04-05 MED ORDER — SODIUM CHLORIDE 0.9 % IV SOLN
INTRAVENOUS | Status: AC
  Administered 2018-04-05 (×2): via INTRAVENOUS

## 2018-04-05 MED ORDER — DEXTROSE 50 % IV SOLN
25.0000 mL | Freq: Once | INTRAVENOUS | Status: AC
  Administered 2018-04-05: 25 mL via INTRAVENOUS
  Filled 2018-04-05: qty 50

## 2018-04-05 MED ORDER — INSULIN ASPART 100 UNIT/ML IV SOLN
10.0000 [IU] | Freq: Once | INTRAVENOUS | Status: AC
  Administered 2018-04-05: 10 [IU] via INTRAVENOUS
  Filled 2018-04-05: qty 0.1

## 2018-04-05 NOTE — Progress Notes (Signed)
[place] [facility] [place] NAME: [name] [name]    MR#:  [id number]  DATE OF BIRTH:  [birth year]  SUBJECTIVE:   No bowel movement past 3 days.  Pain medications are working  REVIEW OF SYSTEMS:    Review of Systems  Constitutional: Negative for chills and fever.  HENT: Negative for congestion and tinnitus.   Eyes: Negative for blurred vision and double vision.  Respiratory: Negative for cough, shortness of breath and wheezing.   Cardiovascular: Negative for chest pain, orthopnea and PND.  Gastrointestinal: Positive for constipation. Negative for abdominal pain, diarrhea, nausea and vomiting.  Genitourinary: Negative for dysuria and hematuria.  Musculoskeletal: Positive for joint pain (better).  Neurological: Negative for dizziness, sensory change and focal weakness.  Psychiatric/Behavioral: Negative for substance abuse.  All other systems reviewed and are negative.     DRUG ALLERGIES:   Allergies  Allergen Reactions  . Dulaglutide Anaphylaxis, Diarrhea and Hives  . Prednisone Rash    VITALS:  Blood pressure 118/82, pulse 85, temperature (!) 97.5 F (36.4 C), resp. rate 20, height 5\' 7"  (1.702 m), weight 99.8 kg, SpO2 96 %.  PHYSICAL EXAMINATION:   Physical Exam  GENERAL:  57 y.o.-year-old patient lying in bed a bit anxious but in NAD.  EYES: Pupils equal, round, reactive to light and accommodation. No scleral icterus. Extraocular muscles intact.  HEENT: Head atraumatic, normocephalic. Oropharynx and nasopharynx clear.  NECK:  Supple, no jugular venous distention. No thyroid enlargement, no tenderness.  LUNGS: Normal breath sounds bilaterally, no wheezing, rales, rhonchi. No use of accessory muscles of respiration.  CARDIOVASCULAR: S1, S2 normal. No murmurs, rubs, or gallops.  ABDOMEN: Soft, nontender, nondistended. Bowel sounds present. No organomegaly or mass.  EXTREMITIES: No cyanosis, clubbing or edema b/l.   Right foot  dressing in place with whole foot covered.  No drainage/bleeding. NEUROLOGIC: Cranial nerves II through XII are intact. No focal Motor or sensory deficits b/l.   PSYCHIATRIC: The patient is alert and oriented x 3.  SKIN: No obvious rash, lesion, or ulcer.    LABORATORY PANEL:   CBC Recent Labs  Lab 04/05/18 0506  WBC 8.6  HGB 8.3*  HCT 28.2*  PLT 345   ------------------------------------------------------------------------------------------------------------------  Chemistries  Recent Labs  Lab 03/31/18 0606  04/05/18 0506  NA 134*   < > 130*  K 5.0   < > 5.6*  CL 95*   < > 93*  CO2 27   < > 28  GLUCOSE 153*   < > 182*  BUN 17   < > 25*  CREATININE 0.99   < > 1.24  CALCIUM 8.6*   < > 8.2*  MG  --    < > 2.3  AST 21  --   --   ALT 18  --   --   ALKPHOS 97  --   --   BILITOT 0.7  --   --    < > = values in this interval not displayed.   ------------------------------------------------------------------------------------------------------------------  Cardiac Enzymes No results for input(s): TROPONINI in the last 168 hours. ------------------------------------------------------------------------------------------------------------------  RADIOLOGY:  No results found.   ASSESSMENT AND PLAN:   57 yo male w/ hx of DM, HTN, hyperlipidemia, morbid obesity, ischemic cardiomyopathy, hypertension, COPD, chronic combined systolic diastolic CHF who presents to the hospital due to right foot pain noted to have a right diabetic foot ulcer.  1.  Right diabetic foot ulcer/osteomyelitis  He is POD #4  BKA  Antibiotics stopped since he has had BKA. Plan for dressing change if soiled as per vascular surgery.   Continue PRN pain medications Physical therapy has recommended inpatient rehab   Will have outpatient vascular surgery appointment in 1 week post discharge.  2.  Diabetes type 2: Continue Lantus with sliding scale and ADA diet   3.  Tobacco dependence: Patient is  encouraged to quit smoking. Counseling was provided..  4.  Anxiety: Continue as needed Xanax.  5.  Hyperlipidemia: Continue atorvastatin.  6.  Neuropathy secondary to diabetes: Continue gabapentin.  7.  Constipation: Patient on aggressive regimen however no bowel movement x3 days and on narcotics.  One-time dose of Relistor.  8.  Acute on chronic anemia: Acute blood loss from surgery No indication for transfusion Hemoglobin has been stable  9.  Hyponatremia: Restart IV fluids and recheck in a.m.  10.  Hyperkalemia: Kayexalate, insulin and dextrose given Aldactone discontinued for now Repeat BMP   We are waiting to hear from CIR which will be Monday.  All the records are reviewed and case discussed with Care Management/Social Worker. Management plans discussed with the patient and he is  in agreement.  CODE STATUS: Full code  DVT Prophylaxis: Ted's and SCD's.   TOTAL TIME TAKING CARE OF THIS PATIENT: 23 minutes.   POSSIBLE D/C tomorrow  DEPENDING ON CLINICAL CONDITION.   PT RECS CIR awaiting approval    Morelia Cassells M.D on 04/05/2018 at 9:46 AM  Between 7am to 6pm - Pager - [phone]  After 6pm go to [email or web address] - Technical [employer] [facility]  Office  [phone]  CC: Primary care physician; Clarisse Gouge, MD

## 2018-04-05 NOTE — Progress Notes (Signed)
PT Cancellation Note  Patient Details Name: [name] [name] MRN: [id number] DOB: March 18, 1961   Cancelled Treatment:    Reason Eval/Treat Not Completed: Patient declined, no reason specified.  Despite being in bed all weekend, pt did not want to work with PT.  He reported using BSC as his activity for the day and let nursing know about this.  Follow up tomorrow with plan for gait and mobility.   Ramond Dial 04/05/2018, 5:07 PM   Mee Hives, PT MS Acute Rehab Dept. Number: [place] and [place]

## 2018-04-06 LAB — BASIC METABOLIC PANEL
Anion gap: 5 (ref 5–15)
BUN: 22 mg/dL — ABNORMAL HIGH (ref 6–20)
CO2: 31 mmol/L (ref 22–32)
Calcium: 8.4 mg/dL — ABNORMAL LOW (ref 8.9–10.3)
Chloride: 96 mmol/L — ABNORMAL LOW (ref 98–111)
Creatinine, Ser: 0.83 mg/dL (ref 0.61–1.24)
GFR calc Af Amer: >60 mL/min (ref >60)
GFR calc non Af Amer: >60 mL/min (ref >60)
Glucose, Bld: 129 mg/dL — ABNORMAL HIGH (ref 70–99)
Potassium: 5.5 mmol/L — ABNORMAL HIGH (ref 3.5–5.1)
Sodium: 132 mmol/L — ABNORMAL LOW (ref 135–145)

## 2018-04-06 LAB — FOLATE: Folate: 12 ng/mL (ref >5.9)

## 2018-04-06 LAB — GLUCOSE, CAPILLARY
Glucose-Capillary: 128 mg/dL — ABNORMAL HIGH (ref 70–99)
Glucose-Capillary: 256 mg/dL — ABNORMAL HIGH (ref 70–99)
Glucose-Capillary: 171 mg/dL — ABNORMAL HIGH (ref 70–99)
Glucose-Capillary: 198 mg/dL — ABNORMAL HIGH (ref 70–99)

## 2018-04-06 LAB — IRON AND TIBC
Iron: 33 ug/dL — ABNORMAL LOW (ref 45–182)
Saturation Ratios: 9 % — ABNORMAL LOW (ref 17.9–39.5)
TIBC: 365 ug/dL (ref 250–450)
UIBC: 332 ug/dL

## 2018-04-06 LAB — CBC
HCT: 30 % — ABNORMAL LOW (ref 39.0–52.0)
Hemoglobin: 8.9 g/dL — ABNORMAL LOW (ref 13.0–17.0)
MCH: 21 pg — ABNORMAL LOW (ref 26.0–34.0)
MCHC: 29.7 g/dL — ABNORMAL LOW (ref 30.0–36.0)
MCV: 70.8 fL — ABNORMAL LOW (ref 80.0–100.0)
Platelets: 411 10*3/uL — ABNORMAL HIGH (ref 150–400)
RBC: 4.24 MIL/uL (ref 4.22–5.81)
RDW: 21.6 % — ABNORMAL HIGH (ref 11.5–15.5)
WBC: 8.5 10*3/uL (ref 4.0–10.5)
nRBC: 0 % (ref 0.0–0.2)

## 2018-04-06 LAB — MAGNESIUM: Magnesium: 2.2 mg/dL (ref 1.7–2.4)

## 2018-04-06 LAB — VITAMIN B12: Vitamin B-12: 414 pg/mL (ref 180–914)

## 2018-04-06 LAB — FERRITIN: Ferritin: 67 ng/mL (ref 24–336)

## 2018-04-06 MED ORDER — NITROGLYCERIN 0.4 MG/HR TD PT24: 0.4000 mg | MEDICATED_PATCH | Freq: Every day | TRANSDERMAL | Status: DC

## 2018-04-06 MED ORDER — PATIROMER SORBITEX CALCIUM 8.4 G PO PACK
8.4000 g | PACK | Freq: Once | ORAL | Status: AC
  Administered 2018-04-07: 8.4 g via ORAL
  Filled 2018-04-06: qty 1

## 2018-04-06 MED ORDER — PATIROMER SORBITEX CALCIUM 8.4 G PO PACK
8.4000 g | PACK | Freq: Every day | ORAL | Status: DC
  Administered 2018-04-06: 8.4 g via ORAL
  Filled 2018-04-06: qty 1

## 2018-04-06 MED ORDER — PATIROMER SORBITEX CALCIUM 8.4 G PO PACK: 8.4000 g | PACK | Freq: Once | ORAL | Status: DC

## 2018-04-06 MED ORDER — NITROGLYCERIN 0.4 MG/HR TD PT24
0.4000 mg | MEDICATED_PATCH | Freq: Every day | TRANSDERMAL | Status: DC
  Administered 2018-04-06 – 2018-04-07 (×3): 0.4 mg via TRANSDERMAL
  Filled 2018-04-06 (×3): qty 1

## 2018-04-06 NOTE — Progress Notes (Signed)
[facility]  Daily Progress Note   Subjective  - 5 Days Post-Op  Still having a fair bit of pain.  Pain medication does seem to help.  Has had some constipation.  No major issues.  Working with therapy per his report  Objective Vitals:   04/05/18 2008 04/06/18 0020 04/06/18 0538 04/06/18 1326  BP: 125/74 109/63 130/86 113/89  Pulse: (!) 103 94 93 95  Resp: 20 20 20 16   Temp: 97.8 F (36.6 C) 98.5 F (36.9 C) (!) 97.5 F (36.4 C) (!) 97.4 F (36.3 C)  TempSrc: Oral Oral Oral Oral  SpO2: 93% 95% 96% 92%  Weight:      Height:        Intake/Output Summary (Last 24 hours) at 04/06/2018 1616 Last data filed at 04/06/2018 1438 Gross per 24 hour  Intake 3197.09 ml  Output 6800 ml  Net -3602.91 ml    PULM  CTAB CV  RRR VASC  dressing is clean, dry, and intact with no swelling  Laboratory CBC    Component Value Date/Time   WBC 8.5 04/06/2018 0558   HGB 8.9 (L) 04/06/2018 0558   HGB 15.0 06/08/2014 1037   HCT 30.0 (L) 04/06/2018 0558   HCT 44.5 06/08/2014 1037   PLT 411 (H) 04/06/2018 0558   PLT 261 06/08/2014 1037    BMET    Component Value Date/Time   NA 132 (L) 04/06/2018 0558   NA 132 (L) 01/29/2018 1602   NA 136 06/08/2014 1037   K 5.5 (H) 04/06/2018 0558   K 4.3 06/08/2014 1037   CL 96 (L) 04/06/2018 0558   CL 101 06/08/2014 1037   CO2 31 04/06/2018 0558   CO2 27 06/08/2014 1037   GLUCOSE 129 (H) 04/06/2018 0558   GLUCOSE 415 (H) 06/08/2014 1037   BUN 22 (H) 04/06/2018 0558   BUN 9 01/29/2018 1602   BUN 9 06/08/2014 1037   CREATININE 0.83 04/06/2018 0558   CREATININE 0.87 02/05/2018 1417   CALCIUM 8.4 (L) 04/06/2018 0558   CALCIUM 9.2 06/08/2014 1037   GFRNONAA >60 04/06/2018 0558   GFRNONAA >60 06/08/2014 1037   GFRNONAA >60 02/17/2014 0909   GFRAA >60 04/06/2018 0558   GFRAA >60 06/08/2014 1037   GFRAA >60 02/17/2014 0909    Assessment/Planning: POD #5 s/p right BKA   Overall doing reasonably well  Okay to  discharge from vascular point of view tomorrow  Should return in the office in 3 weeks for staple removal.    Leotis Pain  04/06/2018, 4:16 PM

## 2018-04-06 NOTE — Progress Notes (Signed)
OT Cancellation Note  Patient Details Name: [name] [name] MRN: [id number] DOB: Apr 08, 1961   Cancelled Treatment:    Reason Eval/Treat Not Completed: Medical issues which prohibited therapy. Chart reviewed. Pt noted with an elevated potassium of 5.5. Per therapy protocol due to elevated potassium, will hold OT treatment at this time. Will continue to follow acutely for appropriateness to participate in therapy.   Jeni Salles, MPH, MS, OTR/L ascom [phone] 04/06/18, 9:38 AM

## 2018-04-06 NOTE — Progress Notes (Signed)
Inpatient Rehabilitation-Admissions Coordinator   Noted reduction in IV morphine use for pain control over over the weekend (last dose recorded at 2pm yesterday). However, pt did decline PT twice over the weekend. At this time, pt does not demonstrate the appropriate tolerance needed for a CIR admission. AC will follow along while pt in house to continue to assess tolerance for possible admission.   Once medically appropriate, pt would need to demonstrate a willingness to participate in therapy sessions, show tolerance for sitting up in a chair for at least 1 hour at a time, and continue to attempt transfers with therapy. Will discuss this with pt over the phone.   Please call if questions.   Jhonnie Garner, OTR/L  Rehab Admissions Coordinator  [phone] 04/06/2018 10:23 AM

## 2018-04-06 NOTE — Progress Notes (Signed)
[name] at [facility] NAME: [name] [name]    MR#:  [id number]  DATE OF BIRTH:  03-Feb-1961  SUBJECTIVE:   No bowel movement past 3 days.  Pain medications are working  REVIEW OF SYSTEMS:    Review of Systems  Constitutional: Negative for chills and fever.  HENT: Negative for congestion and tinnitus.   Eyes: Negative for blurred vision and double vision.  Respiratory: Negative for cough, shortness of breath and wheezing.   Cardiovascular: Negative for chest pain, orthopnea and PND.  Gastrointestinal: Negative for abdominal pain, constipation, diarrhea, nausea and vomiting.  Genitourinary: Negative for dysuria and hematuria.  Musculoskeletal: Positive for joint pain (better).  Neurological: Negative for dizziness, sensory change and focal weakness.  Psychiatric/Behavioral: Negative for substance abuse.  All other systems reviewed and are negative.   DRUG ALLERGIES:   Allergies  Allergen Reactions  . Dulaglutide Anaphylaxis, Diarrhea and Hives  . Prednisone Rash    VITALS:  Blood pressure 130/86, pulse 93, temperature (!) 97.5 F (36.4 C), temperature source Oral, resp. rate 20, height 5\' 7"  (1.702 m), weight 99.8 kg, SpO2 96 %.  PHYSICAL EXAMINATION:   Physical Exam  GENERAL:  57 y.o.-year-old patient lying in bed, in NAD, pleasant EYES: Pupils equal, round, reactive to light and accommodation. No scleral icterus. Extraocular muscles intact.  HEENT: Head atraumatic, normocephalic. Oropharynx and nasopharynx clear.  NECK:  Supple, no jugular venous distention. No thyroid enlargement, no tenderness.  LUNGS: Normal breath sounds bilaterally, no wheezing, rales, rhonchi. No use of accessory muscles of respiration.  CARDIOVASCULAR: RRR, S1, S2 normal. No murmurs, rubs, or gallops.  ABDOMEN: Soft, nontender, nondistended. Bowel sounds present. No organomegaly or mass.  EXTREMITIES: No cyanosis, clubbing or edema b/l.  Right stump  with dry dressing in place, no drainage/bleeding. NEUROLOGIC: Cranial nerves II through XII are intact. No focal Motor or sensory deficits b/l.   PSYCHIATRIC: The patient is alert and oriented x 3.  SKIN: No obvious rash, lesion, or ulcer.   LABORATORY PANEL:   CBC Recent Labs  Lab 04/06/18 0558  WBC 8.5  HGB 8.9*  HCT 30.0*  PLT 411*   ------------------------------------------------------------------------------------------------------------------  Chemistries  Recent Labs  Lab 03/31/18 0606  04/06/18 0558  NA 134*   < > 132*  K 5.0   < > 5.5*  CL 95*   < > 96*  CO2 27   < > 31  GLUCOSE 153*   < > 129*  BUN 17   < > 22*  CREATININE 0.99   < > 0.83  CALCIUM 8.6*   < > 8.4*  MG  --    < > 2.2  AST 21  --   --   ALT 18  --   --   ALKPHOS 97  --   --   BILITOT 0.7  --   --    < > = values in this interval not displayed.   ------------------------------------------------------------------------------------------------------------------  Cardiac Enzymes No results for input(s): TROPONINI in the last 168 hours. ------------------------------------------------------------------------------------------------------------------  RADIOLOGY:  No results found.   ASSESSMENT AND PLAN:   57 yo male w/ hx of DM, HTN, hyperlipidemia, morbid obesity, ischemic cardiomyopathy, hypertension, COPD, chronic combined systolic diastolic CHF who presents to the hospital due to right foot pain noted to have a right diabetic foot ulcer.  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  Hyperkalemia- may be related to spironolactone, which was discontinued yesterday.  Kidney function is normal.  No vomiting or  hyperglycemia. -Will give Veltassa twice daily today -Check potassium in the morning  Diabetes type 2: Blood sugars have been well controlled Continue Lantus with sliding scale and ADA diet   Tobacco dependence: Patient is encouraged to quit smoking. Counseling was provided..  Anxiety: Continue as needed Xanax.  Hyperlipidemia: Continue atorvastatin.  Neuropathy secondary to diabetes: Continue gabapentin.  Constipation: Resolved.  Patient had 2 large bowel movements yesterday.  Acute on chronic anemia: Acute blood loss from surgery. Hemoglobin remains stable. -Check anemia panel -No indication for transfusion  Hyponatremia: Stable off IV fluids. -Recheck sodium in the morning  Still hopeful for discharge to CIR, if patient can show willingness to participate in physical therapy sessions.  All the records are reviewed and case discussed with Care Management/Social Worker. Management plans discussed with the patient and he is  in agreement.  CODE STATUS: Full code  DVT Prophylaxis: Ted's and SCD's  TOTAL TIME TAKING CARE OF THIS PATIENT: 23 minutes.   POSSIBLE D/C 1-2 days,  DEPENDING ON CLINICAL CONDITION.  Berna Spare Mayo M.D on 04/06/2018 at 1:15 PM  Between 7am to 6pm - Pager - [phone]  After 6pm go to [email or web address] - Technical brewer [facility]  Office  [phone]  CC: Primary care physician; Clarisse Gouge, MD

## 2018-04-06 NOTE — Care Management (Signed)
Per CIR note from today patient does not demonstrate the appropriate tolerance needed for CIR admission.   Admissions Coordinator was to speak to patient.   RNCM attempted to speak with patient, however he is asleep at this time.  Per previous conversation if he was not approved for CIR he wished to return home with home health.  RNCM to follow up tomorrow.

## 2018-04-06 NOTE — Progress Notes (Signed)
PT Cancellation Note  Patient Details Name: [name] [name] MRN: [id number] DOB: 1960-07-22   Cancelled Treatment:    Reason Eval/Treat Not Completed: Medical issues which prohibited therapy. Per chart review, pt K level is 5.5 this morn. Hold PT until labs safe for participation with PT.    Larae Grooms, PTA 04/06/2018, 11:55 AM

## 2018-04-07 LAB — GLUCOSE, CAPILLARY
Glucose-Capillary: 149 mg/dL — ABNORMAL HIGH (ref 70–99)
Glucose-Capillary: 178 mg/dL — ABNORMAL HIGH (ref 70–99)

## 2018-04-07 LAB — BASIC METABOLIC PANEL
Anion gap: 6 (ref 5–15)
BUN: 17 mg/dL (ref 6–20)
CO2: 31 mmol/L (ref 22–32)
Calcium: 8.9 mg/dL (ref 8.9–10.3)
Chloride: 101 mmol/L (ref 98–111)
Creatinine, Ser: 0.95 mg/dL (ref 0.61–1.24)
GFR calc Af Amer: >60 mL/min (ref >60)
GFR calc non Af Amer: >60 mL/min (ref >60)
Glucose, Bld: 173 mg/dL — ABNORMAL HIGH (ref 70–99)
Potassium: 5.4 mmol/L — ABNORMAL HIGH (ref 3.5–5.1)
Sodium: 138 mmol/L (ref 135–145)

## 2018-04-07 LAB — CBC
HCT: 29.2 % — ABNORMAL LOW (ref 39.0–52.0)
Hemoglobin: 8.7 g/dL — ABNORMAL LOW (ref 13.0–17.0)
MCH: 21.2 pg — ABNORMAL LOW (ref 26.0–34.0)
MCHC: 29.8 g/dL — ABNORMAL LOW (ref 30.0–36.0)
MCV: 71.2 fL — ABNORMAL LOW (ref 80.0–100.0)
Platelets: 418 10*3/uL — ABNORMAL HIGH (ref 150–400)
RBC: 4.1 MIL/uL — ABNORMAL LOW (ref 4.22–5.81)
RDW: 21.9 % — ABNORMAL HIGH (ref 11.5–15.5)
WBC: 9.5 10*3/uL (ref 4.0–10.5)
nRBC: 0 % (ref 0.0–0.2)

## 2018-04-07 MED ORDER — INSULIN GLARGINE 100 UNIT/ML SOLOSTAR PEN: 30.0000 [IU] | PEN_INJECTOR | Freq: Every day | SUBCUTANEOUS | 11 refills | Status: DC

## 2018-04-07 MED ORDER — PATIROMER SORBITEX CALCIUM 8.4 G PO PACK
16.8000 g | PACK | Freq: Once | ORAL | Status: AC
  Administered 2018-04-07: 16.8 g via ORAL
  Filled 2018-04-07: qty 2

## 2018-04-07 NOTE — Progress Notes (Signed)
[name] [name]  A and O x 4. VSS. Pt tolerating diet well. No complaints of pain or nausea. IV removed intact, prescriptions given. Pt voiced understanding of discharge instructions with no further questions. Pt discharged via wheelchair with nurse tech.     Allergies as of 04/07/2018      Reactions   Dulaglutide Anaphylaxis, Diarrhea, Hives   Prednisone Rash      Medication List    STOP taking these medications   naproxen sodium 220 MG tablet Commonly known as:  ALEVE   nicotine 14 mg/24hr patch Commonly known as:  NICODERM CQ - dosed in mg/24 hours   nutrition supplement (JUVEN) Pack   potassium chloride 10 MEQ tablet Commonly known as:  K-DUR,KLOR-CON   spironolactone 25 MG tablet Commonly known as:  ALDACTONE     TAKE these medications   albuterol 108 (90 Base) MCG/ACT inhaler Commonly known as:  PROVENTIL HFA;VENTOLIN HFA Inhale 2 puffs into the lungs every 6 (six) hours as needed for wheezing or shortness of breath.   aspirin EC 81 MG tablet Take 1 tablet (81 mg total) by mouth daily.   atorvastatin 80 MG tablet Commonly known as:  LIPITOR Take 80 mg by mouth daily.   calcium carbonate 500 MG chewable tablet Commonly known as:  TUMS - dosed in mg elemental calcium Chew 4 tablets by mouth daily as needed for indigestion or heartburn.   clopidogrel 75 MG tablet Commonly known as:  PLAVIX Take 75 mg by mouth daily.   diphenhydrAMINE 25 MG tablet Commonly known as:  BENADRYL Take 25 mg by mouth daily as needed for allergies.   ezetimibe 10 MG tablet Commonly known as:  ZETIA Take 10 mg by mouth daily.   furosemide 40 MG tablet Commonly known as:  LASIX Take 40 mg by mouth daily. May take twice daily only if needed   gabapentin 800 MG tablet Commonly known as:  NEURONTIN Take 800 mg by mouth 4 (four) times daily.   Insulin Glargine 100 UNIT/ML Solostar Pen Commonly known as:  LANTUS Inject 30 Units into the skin at bedtime. What changed:  how much  to take   ipratropium-albuterol 0.5-2.5 (3) MG/3ML Soln Commonly known as:  DUONEB Inhale 3 mLs into the lungs every 6 (six) hours as needed (wheezing/sob).   metFORMIN 500 MG 24 hr tablet Commonly known as:  GLUCOPHAGE-XR Take 500 mg by mouth 2 (two) times daily.   multivitamin-lutein Caps capsule Take 1 capsule by mouth daily.   nitroGLYCERIN 0.4 MG SL tablet Commonly known as:  NITROSTAT Place 0.4 mg under the tongue every 5 (five) minutes as needed for chest pain.   nitroGLYCERIN 0.4 mg/hr patch Commonly known as:  NITRODUR - Dosed in mg/24 hr Place 0.4 mg onto the skin daily.   Oxycodone HCl 10 MG Tabs Take 10 mg by mouth every 6 (six) hours as needed (pain).   ranitidine 150 MG tablet Commonly known as:  ZANTAC Take 300 mg by mouth daily as needed for heartburn.   traZODone 50 MG tablet Commonly known as:  DESYREL Take 50 mg by mouth at bedtime as needed for sleep.            Durable Medical Equipment  (From admission, onward)         Start     Ordered   04/07/18 1028  For home use only DME standard manual wheelchair with seat cushion  Once    Comments:  Patient suffers from right BKA  which impairs their ability to perform daily activities like bathing, dressing and toileting in the home.  A cane, crutch or walker will not resolve  issue with performing activities of daily living. A wheelchair will allow patient to safely perform daily activities. Patient can safely propel the wheelchair in the home or has a caregiver who can provide assistance.  Accessories: amputee support pad, elevating leg rests (ELRs), wheel locks, extensions and anti-tippers.   04/07/18 1027          Vitals:   04/07/18 0811 04/07/18 1418  BP: (!) 147/78 135/83  Pulse: (!) 112 97  Resp: 18 14  Temp: 98.2 F (36.8 C) 97.8 F (36.6 C)  SpO2: 97% 95%    Francesco Sor

## 2018-04-07 NOTE — Discharge Instructions (Signed)
It was so nice to meet you during this hospitalization!  Below are your wound care instructions: Please change right below the knee dressing every one to two days or sooner if drainage is noted. Please apply Xeroform to staple line.  Apply ABD dressing to staple line.  Cover stump with Kerlix.  Cover Kerlix was Coban. It is okay to leave the stump open to air if the patient is not engaged in physical activity. It is okay for the patient to shower.  The patient should not bathe or submerge in water. Please reinforce with keeping the knee joint straight as the body will naturally want to contract.  If the patients knee joint contracts he will not be able to walk with a prosthesis.  Your potassium was high when you were here. Please STOP taking your spironolactone and potassium for now. Your primary care doctor should recheck your potassium in clinic. The spironolactone may be restarted in the future.  -Dr. Brett Albino

## 2018-04-07 NOTE — Discharge Summary (Addendum)
[facility] [place] NAME: [name] [name]    MR#:  [id number]  DATE OF BIRTH:  Feb 02, 1961  DATE OF ADMISSION:  03/30/2018   ADMITTING PHYSICIAN: Nicholes Mango, MD  DATE OF DISCHARGE: 04/07/18  PRIMARY CARE PHYSICIAN: Clarisse Gouge, MD   ADMISSION DIAGNOSIS:  Cellulitis of right lower extremity [L03.115] Osteomyelitis of foot, acute ([place]) [M86.179] Sepsis ([place]) [A41.9] Diabetic ulcer of right foot associated with type 2 diabetes mellitus, unspecified part of foot, unspecified ulcer stage ([place]) [N56.213, L97.519] Sepsis, due to unspecified organism, unspecified whether acute organ dysfunction present ([place]) [A41.9] DISCHARGE DIAGNOSIS:  Active Problems:   Sepsis ([place])  SECONDARY DIAGNOSIS:   Past Medical History:  Diagnosis Date  . Arthritis   . Asthma   . Atherosclerosis   . BPH (benign prostatic hyperplasia)   . Carotid arterial disease ([place])   . Charcot's joint of foot, right   . Chronic combined systolic (congestive) and diastolic (congestive) heart failure ([place])    a. 02/2013 EF 50% by LV gram; b. 04/2017 Echo: EF 25-30%. diff HK. Gr2 DD. Mod dil LA/RV. PASP 32mmHg.  [name] COPD (chronic obstructive pulmonary disease) ([place])   . Coronary artery disease    a. 2013 S/P PCI of LAD [facility]);  b. 03/2013 PCI: RCA 90p ( 2.5 x 23 mm DES); c. 02/2014 Cath: patent RCA stent->Med Rx; d. 04/2017 Cath: LM nl, LAd 30ost/p, 49m/d, D1 80ost, LCX 90p/m, OM1 85, RCA 70/20p, 7m, 70d->Referred for CT Surg-felt to be poor candidate.  . Diabetic neuropathy ([place])   . Diabetic ulcer of right foot ([place])   . Hernia   . Hyperlipidemia   . Hypertension   . Ischemic cardiomyopathy    a. 04/2017 Echo: EF 25-30%; b. 08/2017 Cardiac MRI ([facility]): EF 19%, sev glob HK. RVEF 20%, mod BAE, triv MR, mild-mod TR. Basal lateral subendocardial infarct (viable), inf/infsept, dist septal ischemia, basal to mid lat peri-infarct ischemia.  . Morbid obesity ([place])   . Neuropathy     . PAD (peripheral artery disease) ([place])    a. Followed by Dr. Lucky Cowboy; b. 08/2010 Periph Angio: RSFA 70-80p (6X60 self-expanding stent); c. 09/2010 Periph Angio: L SFA 100p (7x unknown length self-expanding stent); d. 06/2015 Periph Angio: R SFA short segment occlusion (6x12 self-expanding stent).  . Restless leg syndrome   . Sleep apnea   . Subclavian artery stenosis, right ([place])   . Syncope and collapse   . Tobacco use    a. 75+ yr hx - still smoking 1ppd, down from 2 ppd.  . Type II diabetes mellitus ([place])   . Varicose vein    HOSPITAL COURSE:   [name] [name] is a 57 year old male who presented to the ED with fever and worsening foot infection.  He was scheduled for outpatient right BKA, but had worsening fevers redness and warmth of the right lower extremity.  He was admitted for further management.  He was initially treated with vancomycin and Zosyn.  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  Antibiotics were stopped after his BKA.  He was evaluated by physical therapy, who recommended CIR.  CIR stated that he needed to show a willingness to participate in therapy sessions before they will accept him.  Patient decided that he would prefer to be discharged home with home health services.   Patient noted to have some hyperkalemia this admission.  Felt to be related to spironolactone and potassium supplementation.  Creatinine remained within the normal range.  Patient  was treated with veltassa.  Spironolactone and potassium were held on discharge.  Patient will need repeat BMP as an outpatient.  Home health ordered on discharge. We also ordered a wheelchair for him. Patient suffers from [place], which impairs his ability to perform daily activities like toileting, bathing, and dressing in the home. A cane, walker, or crutch will not resolve the issue with performing ADLs. A wheelchair will allow the patient to safely perform these ADLs. Patient can safely propel the wheelchair in the home and has a caregiver that can  provide assistance. He will also need an amputee support pad due to his right BKA.  DISCHARGE CONDITIONS:  Right BKA Type 2 diabetes Tobacco dependence Anxiety Hyperlipidemia Neuropathy secondary to diabetes Constipation Acute on chronic anemia secondary to acute blood loss Hyponatremia CONSULTS OBTAINED:  Treatment Team:  Sharlotte Alamo, DPM Schnier, Dolores Lory, MD DRUG ALLERGIES:   Allergies  Allergen Reactions  . Dulaglutide Anaphylaxis, Diarrhea and Hives  . Prednisone Rash   DISCHARGE MEDICATIONS:   Allergies as of 04/07/2018      Reactions   Dulaglutide Anaphylaxis, Diarrhea, Hives   Prednisone Rash      Medication List    STOP taking these medications   naproxen sodium 220 MG tablet Commonly known as:  ALEVE   nicotine 14 mg/24hr patch Commonly known as:  NICODERM CQ - dosed in mg/24 hours   nutrition supplement (JUVEN) Pack   potassium chloride 10 MEQ tablet Commonly known as:  K-DUR,KLOR-CON   spironolactone 25 MG tablet Commonly known as:  ALDACTONE     TAKE these medications   albuterol 108 (90 Base) MCG/ACT inhaler Commonly known as:  PROVENTIL HFA;VENTOLIN HFA Inhale 2 puffs into the lungs every 6 (six) hours as needed for wheezing or shortness of breath.   aspirin EC 81 MG tablet Take 1 tablet (81 mg total) by mouth daily.   atorvastatin 80 MG tablet Commonly known as:  LIPITOR Take 80 mg by mouth daily.   calcium carbonate 500 MG chewable tablet Commonly known as:  TUMS - dosed in mg elemental calcium Chew 4 tablets by mouth daily as needed for indigestion or heartburn.   clopidogrel 75 MG tablet Commonly known as:  PLAVIX Take 75 mg by mouth daily.   diphenhydrAMINE 25 MG tablet Commonly known as:  BENADRYL Take 25 mg by mouth daily as needed for allergies.   ezetimibe 10 MG tablet Commonly known as:  ZETIA Take 10 mg by mouth daily.   furosemide 40 MG tablet Commonly known as:  LASIX Take 40 mg by mouth daily. May take twice  daily only if needed   gabapentin 800 MG tablet Commonly known as:  NEURONTIN Take 800 mg by mouth 4 (four) times daily.   Insulin Glargine 100 UNIT/ML Solostar Pen Commonly known as:  LANTUS Inject 30 Units into the skin at bedtime. What changed:  how much to take   ipratropium-albuterol 0.5-2.5 (3) MG/3ML Soln Commonly known as:  DUONEB Inhale 3 mLs into the lungs every 6 (six) hours as needed (wheezing/sob).   metFORMIN 500 MG 24 hr tablet Commonly known as:  GLUCOPHAGE-XR Take 500 mg by mouth 2 (two) times daily.   multivitamin-lutein Caps capsule Take 1 capsule by mouth daily.   nitroGLYCERIN 0.4 MG SL tablet Commonly known as:  NITROSTAT Place 0.4 mg under the tongue every 5 (five) minutes as needed for chest pain.   nitroGLYCERIN 0.4 mg/hr patch Commonly known as:  NITRODUR - Dosed in mg/24 hr  Place 0.4 mg onto the skin daily.   Oxycodone HCl 10 MG Tabs Take 10 mg by mouth every 6 (six) hours as needed (pain).   ranitidine 150 MG tablet Commonly known as:  ZANTAC Take 300 mg by mouth daily as needed for heartburn.   traZODone 50 MG tablet Commonly known as:  DESYREL Take 50 mg by mouth at bedtime as needed for sleep.            Durable Medical Equipment  (From admission, onward)         Start     Ordered   04/07/18 1028  For home use only DME standard manual wheelchair with seat cushion  Once    Comments:  Patient suffers from right BKA which impairs their ability to perform daily activities like bathing, dressing and toileting in the home.  A cane, crutch or walker will not resolve  issue with performing activities of daily living. A wheelchair will allow patient to safely perform daily activities. Patient can safely propel the wheelchair in the home or has a caregiver who can provide assistance.  Accessories: amputee support pad, elevating leg rests (ELRs), wheel locks, extensions and anti-tippers.   04/07/18 1027           DISCHARGE  INSTRUCTIONS:  1.  Follow-up with PCP in 1 week. 2.  Follow-up with vascular surgery in 3 weeks 3.  Spironolactone and potassium supplementation held on discharge due to hyperkalemia.  Recheck BMP as an outpatient. 4.  Patient had some acute on chronic anemia due to acute blood loss from surgery.  Recheck CBC as an outpatient. DIET:  Cardiac and diabetic diet DISCHARGE CONDITION:  Stable ACTIVITY:  Activity as tolerated OXYGEN:  Home Oxygen: No.  Oxygen Delivery: room air DISCHARGE LOCATION:  home   If you experience worsening of your admission symptoms, develop shortness of breath, life threatening emergency, suicidal or homicidal thoughts you must seek medical attention immediately by calling 911 or calling your MD immediately  if symptoms less severe.  You Must read complete instructions/literature along with all the possible adverse reactions/side effects for all the Medicines you take and that have been prescribed to you. Take any new Medicines after you have completely understood and accpet all the possible adverse reactions/side effects.   Please note  You were cared for by a hospitalist during your hospital stay. If you have any questions about your discharge medications or the care you received while you were in the hospital after you are discharged, you can call the unit and asked to speak with the hospitalist on call if the hospitalist that took care of you is not available. Once you are discharged, your primary care physician will handle any further medical issues. Please note that NO REFILLS for any discharge medications will be authorized once you are discharged, as it is imperative that you return to your primary care physician (or establish a relationship with a primary care physician if you do not have one) for your aftercare needs so that they can reassess your need for medications and monitor your lab values.    On the day of Discharge:  VITAL SIGNS:  Blood pressure  (!) 147/78, pulse (!) 112, temperature 98.2 F (36.8 C), temperature source Oral, resp. rate 18, height 5\' 7"  (1.702 m), weight 99.8 kg, SpO2 97 %. PHYSICAL EXAMINATION:  GENERAL:  57 y.o.-year-old patient lying in the bed with no acute distress.  EYES: Pupils equal, round, reactive to light and accommodation. No  scleral icterus. Extraocular muscles intact.  HEENT: Head atraumatic, normocephalic. Oropharynx and nasopharynx clear.  NECK:  Supple, no jugular venous distention. No thyroid enlargement, no tenderness.  LUNGS: Normal breath sounds bilaterally, no wheezing, rales,rhonchi or crepitation. No use of accessory muscles of respiration.  CARDIOVASCULAR: S1, S2 normal. No murmurs, rubs, or gallops.  ABDOMEN: Soft, non-tender, non-distended. Bowel sounds present. No organomegaly or mass.  EXTREMITIES: + Right BKA, dry dressing in place. NEUROLOGIC: Cranial nerves II through XII are intact. + Global weakness. Sensation intact. Gait not checked.  PSYCHIATRIC: The patient is alert and oriented x 3.  SKIN: No obvious rash, lesion, or ulcer.  DATA REVIEW:   CBC Recent Labs  Lab 04/07/18 0429  WBC 9.5  HGB 8.7*  HCT 29.2*  PLT 418*    Chemistries  Recent Labs  Lab 04/06/18 0558 04/07/18 0429  NA 132* 138  K 5.5* 5.4*  CL 96* 101  CO2 31 31  GLUCOSE 129* 173*  BUN 22* 17  CREATININE 0.83 0.95  CALCIUM 8.4* 8.9  MG 2.2  --      Microbiology Results  Results for orders placed or performed during the hospital encounter of 03/30/18  Blood Culture (routine x 2)     Status: None   Collection Time: 03/30/18  2:40 PM  Result Value Ref Range Status   Specimen Description BLOOD BLOOD RIGHT HAND  Final   Special Requests   Final    BOTTLES DRAWN AEROBIC AND ANAEROBIC Blood Culture results may not be optimal due to an excessive volume of blood received in culture bottles   Culture   Final    NO GROWTH 5 DAYS Performed at [facility] [address], [place], [place]  [address]    Report Status 04/04/2018 FINAL  Final  Blood Culture (routine x 2)     Status: None   Collection Time: 03/30/18  2:40 PM  Result Value Ref Range Status   Specimen Description BLOOD BLOOD RIGHT FOREARM  Final   Special Requests   Final    BOTTLES DRAWN AEROBIC AND ANAEROBIC Blood Culture adequate volume   Culture   Final    NO GROWTH 5 DAYS Performed at [facility], [address], [place], [place] [address]    Report Status 04/04/2018 FINAL  Final  Urine culture     Status: None   Collection Time: 03/31/18  2:45 AM  Result Value Ref Range Status   Specimen Description   Final    URINE, RANDOM Performed at [facility], [address], [place], [place] [address]    Special Requests   Final    NONE Performed at [facility], [address] [place], [place] [address]    Culture   Final    NO GROWTH Performed at [facility], [place] [address], [place], [place] [address]    Report Status 04/01/2018 FINAL  Final  MRSA PCR Screening     Status: None   Collection Time: 03/31/18  1:44 PM  Result Value Ref Range Status   MRSA by PCR NEGATIVE NEGATIVE Final    Comment:        The GeneXpert MRSA Assay (FDA approved for NASAL specimens only), is one component of a comprehensive MRSA colonization surveillance program. It is not intended to diagnose MRSA infection nor to guide or monitor treatment for MRSA infections. Performed at [facility] [place], [address], [place], [place] [address]     RADIOLOGY:  No results found.   Management plans discussed with the patient, family and they are in agreement.  CODE STATUS: Full Code   TOTAL TIME TAKING CARE OF THIS PATIENT: 35 minutes.    Berna Spare Razia Screws M.D on 04/07/2018 at 1:35 PM  Between 7am to 6pm - Pager - [phone]  After 6pm go to [email or web address] - Technical brewer [facility]  Office  [phone]  CC: Primary care physician; Clarisse Gouge, MD   Note: This dictation was prepared with Dragon dictation along with smaller phrase technology. Any transcriptional errors that result from this process are unintentional.

## 2018-04-07 NOTE — Progress Notes (Signed)
[name]: [phone]   If patient discharges after hours, please call [phone].   [name] [name] 04/07/2018, 10:48 AM

## 2018-04-07 NOTE — Progress Notes (Signed)
Physical Therapy Treatment Patient Details Name: [name] [name] MRN: [id number] DOB: Sep 09, 1960 Today's Date: 04/07/2018    History of Present Illness 57 y.o. male admitted for sepsis on 03/30/18. Pt has history of DM and has had multiple debridements on R foot. Imaging showed osteomyelitis of R foot. Prior to hospitalization had R BKA scheduled for 10/25, d/t worsening condition rescheduled and procedure was performed on 10/23. Pt's PMHx includes: DM, chronic diabetic foot ulcer, chronic systolic CHF, HTN, CAD, hyperlipidemia, COPD, and neuropathy. Per pt has had one fall in the past year where LLE gave out when stepping over the threshold into the home, no reports of injury.    PT Comments    Pt is continuing to progress toward goals. Today's session focused on wheel chair transfers for safety with planned dc today. Pt educated on wc safety features, transfers in and out of chair, transfers in and out of car. Pt performed mock transfer into car (SUV) using elevated bed and recliner as stand-in for wc, using stand-pivot transfer with RW and CGA. Handout given for navigating stairs with wc. Pt demonstrates improved strength in UE performing lateral scooting safely. Still having difficulty with sit-to-stands, particularly with anterior translation from a low surface requiring Mod A. Pt feels safe and comfortable to go home, states he has 3 people living at home who can offer physical support to help with transfers in the home. Changing PT recommendation to HHPT d/t pt being declined from CIR and pt refusing SNF.   Follow Up Recommendations  Home health PT     Equipment Recommendations  Rolling walker with 5" wheels    Recommendations for Other Services       Precautions / Restrictions Precautions Precautions: Fall Restrictions Weight Bearing Restrictions: No    Mobility  Bed Mobility Overal bed mobility: Modified Independent Bed Mobility: Supine to Sit     Supine to sit: Modified  independent (Device/Increase time)     General bed mobility comments: Pt performs safely without verbal cues  Transfers Overall transfer level: Needs assistance Equipment used: Rolling walker (2 wheeled) Transfers: Sit to/from [facility];Lateral/Scoot Transfers Sit to Stand: Mod assist Stand pivot transfers: Min guard      Lateral/Scoot Transfers: Independent General transfer comment: Pt performs 3xSTS. Requires Mod A for assist to increase anterior translation. Has controlled decent to chair/bed using UE for support. Improved safe lateral scoot, with UEs can clear hips from surface for scoot.  Ambulation/Gait             General Gait Details: Unsafe to perform this date.   Stairs             Wheelchair Mobility    Modified Rankin (Stroke Patients Only)       Balance Overall balance assessment: Needs assistance Sitting-balance support: Bilateral upper extremity supported;Feet supported;No upper extremity supported Sitting balance-Leahy Scale: Good Sitting balance - Comments: Tolerates moderate weight shift without LOB.   Standing balance support: Bilateral upper extremity supported Standing balance-Leahy Scale: Fair Standing balance comment: Uses RW for UE support. With Min A able to maintain standing position and with bilateral hands adjust pants.                            Cognition Arousal/Alertness: Awake/alert Behavior During Therapy: WFL for tasks assessed/performed Overall Cognitive Status: Within Functional Limits for tasks assessed  Exercises Other Exercises Other Exercises: Pt educated on safe transfers with w/c including practicing transfer. Simulated car transfer using elevated bed. Handout provided for managing w/c and stairs    General Comments        Pertinent Vitals/Pain Pain Assessment: 0-10 Pain Score: 8  Pain Location: R knee and at distal  RLE Pain Descriptors / Indicators: Discomfort;Sore Pain Intervention(s): Limited activity within patient's tolerance;Monitored during session    Home Living                      Prior Function            PT Goals (current goals can now be found in the care plan section) Acute Rehab PT Goals Patient Stated Goal: to regain as much independence as possible and get a prosthesis PT Goal Formulation: With patient Time For Goal Achievement: 04/16/18 Potential to Achieve Goals: Good Progress towards PT goals: Progressing toward goals    Frequency    7X/week      PT Plan Current plan remains appropriate    Co-evaluation              AM-PAC PT "6 Clicks" Daily Activity  Outcome Measure  Difficulty turning over in bed (including adjusting bedclothes, sheets and blankets)?: A Little Difficulty moving from lying on back to sitting on the side of the bed? : A Little Difficulty sitting down on and standing up from a chair with arms (e.g., wheelchair, bedside commode, etc,.)?: Unable Help needed moving to and from a bed to chair (including a wheelchair)?: A Little Help needed walking in hospital room?: Total Help needed climbing 3-5 steps with a railing? : Total 6 Click Score: 12    End of Session Equipment Utilized During Treatment: Gait belt Activity Tolerance: Patient tolerated treatment well     PT Visit Diagnosis: Unsteadiness on feet (R26.81);Muscle weakness (generalized) (M62.81);Other (comment)     Time: 4034-7425 PT Time Calculation (min) (ACUTE ONLY): 28 min  Charges:                        Algis Downs, SPT  04/07/2018, 4:44 PM

## 2018-04-07 NOTE — Care Management Note (Signed)
Case Management Note  Patient Details  Name: PANAGIOTIS OELKERS MRN: [id number] Date of Birth: 01-29-1961   Patient to discharge home today .  Resumption for home health orders have been entered.  Corene Cornea with [facility] notified of discharge.  Wheelchair has been ordered.  To be delivered to patients home today with amputee support pad.    Initially patient request pneumatic wheelchair.  RNCM checked with 4 different DME agencies.  None of the agency had in stock.  Per [facility] equipment would have to be orders.  After that it would be a waiting period of 30-60 days for Medicaid to approve (if they approve). Patient states that due to his condition he is not able to wait to obtain the equipment, and request that a standard wheel chair be orders.   RNCM signing off.   Subjective/Objective:                    Action/Plan:   Expected Discharge Date:  04/07/18               Expected Discharge Plan:  [place]  In-House Referral:     Discharge planning Services  CM Consult  Post Acute Care Choice:  Durable Medical Equipment, Home Health, Resumption of Svcs/PTA Provider Choice offered to:  Patient  DME Arranged:  Wheelchair manual DME Agency:  [place]:  RN, PT, OT, Nurse's Aide [place] Agency:  [place]  Status of Service:  Completed, signed off  If discussed at [place] of Stay Meetings, dates discussed:    Additional Comments:  Beverly Sessions, RN 04/07/2018, 1:47 PM

## 2018-04-08 ENCOUNTER — Telehealth (INDEPENDENT_AMBULATORY_CARE_PROVIDER_SITE_OTHER): Payer: Self-pay

## 2018-04-08 NOTE — Telephone Encounter (Signed)
Misty from [facility] just inform me that the patient had fell  and she put a dressing over site

## 2018-04-13 ENCOUNTER — Telehealth (INDEPENDENT_AMBULATORY_CARE_PROVIDER_SITE_OTHER): Payer: Self-pay

## 2018-04-13 NOTE — Telephone Encounter (Signed)
That is fine continue PT for one month

## 2018-04-13 NOTE — Telephone Encounter (Signed)
The nurse/PT called to ask if she can have a verbal order for this patient to have Physical Therapy for the entire month of November?

## 2018-04-14 NOTE — Telephone Encounter (Signed)
Called the nurse/PT back to let her know that Dr. Delana Meyer agreed that the patient's PT can be extended for the entire month of November.

## 2018-04-21 ENCOUNTER — Ambulatory Visit: Payer: Medicaid Other | Admitting: Cardiovascular Disease

## 2018-04-28 ENCOUNTER — Ambulatory Visit (INDEPENDENT_AMBULATORY_CARE_PROVIDER_SITE_OTHER): Payer: Medicaid Other | Admitting: Nurse Practitioner

## 2018-04-28 ENCOUNTER — Encounter (INDEPENDENT_AMBULATORY_CARE_PROVIDER_SITE_OTHER): Payer: Self-pay | Admitting: Nurse Practitioner

## 2018-04-28 DIAGNOSIS — Z89511 Acquired absence of right leg below knee: Secondary | ICD-10-CM

## 2018-04-28 DIAGNOSIS — S88111A Complete traumatic amputation at level between knee and ankle, right lower leg, initial encounter: Secondary | ICD-10-CM

## 2018-04-28 NOTE — Progress Notes (Signed)
Subjective:    Patient ID: [name] [name], male    DOB: [birth year], 57 y.o.   MRN: [id number] Chief Complaint  Patient presents with  . Follow-up    Staple removal-3 week    HPI  [name] [name] is a 57 y.o. 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  Currently not reporting any pain or issues with the wound.  He denies any fever, chills, nausea, vomiting.  He denies any copious bleeding or purulent draining fluid from the wound.  Patient is present with partner.  Past Medical History:  Diagnosis Date  . Arthritis   . Asthma   . Atherosclerosis   . BPH (benign prostatic hyperplasia)   . Carotid arterial disease ([place])   . Charcot's joint of foot, right   . Chronic combined systolic (congestive) and diastolic (congestive) heart failure ([place])    a. 02/2013 EF 50% by LV gram; b. 04/2017 Echo: EF 25-30%. diff HK. Gr2 DD. Mod dil LA/RV. PASP 46mmHg.  [name] COPD (chronic obstructive pulmonary disease) ([place])   . Coronary artery disease    a. 2013 S/P PCI of LAD [facility]);  b. 03/2013 PCI: RCA 90p ( 2.5 x 23 mm DES); c. 02/2014 Cath: patent RCA stent->Med Rx; d. 04/2017 Cath: LM nl, LAd 30ost/p, 75m/d, D1 80ost, LCX 90p/m, OM1 85, RCA 70/20p, 65m, 70d->Referred for CT Surg-felt to be poor candidate.  . Diabetic neuropathy ([place])   . Diabetic ulcer of right foot ([place])   . Hernia   . Hyperlipidemia   . Hypertension   . Ischemic cardiomyopathy    a. 04/2017 Echo: EF 25-30%; b. 08/2017 Cardiac MRI ([facility]): EF 19%, sev glob HK. RVEF 20%, mod BAE, triv MR, mild-mod TR. Basal lateral subendocardial infarct (viable), inf/infsept, dist septal ischemia, basal to mid lat peri-infarct ischemia.  . Morbid obesity ([place])   . Neuropathy   . PAD (peripheral artery disease) ([place])    a. Followed by Dr. Lucky Cowboy; b. 08/2010 Periph Angio: RSFA 70-80p (6X60 self-expanding stent); c. 09/2010 Periph Angio: L SFA 100p (7x unknown length self-expanding stent); d. 06/2015 Periph  Angio: R SFA short segment occlusion (6x12 self-expanding stent).  . Restless leg syndrome   . Sleep apnea   . Subclavian artery stenosis, right ([place])   . Syncope and collapse   . Tobacco use    a. 75+ yr hx - still smoking 1ppd, down from 2 ppd.  . Type II diabetes mellitus ([place])   . Varicose vein     Past Surgical History:  Procedure Laterality Date  . ABDOMINAL AORTAGRAM N/A 06/23/2013   Procedure: ABDOMINAL Maxcine Ham;  Surgeon: Wellington Hampshire, MD;  Location: [place] [facility];  Service: Cardiovascular;  Laterality: N/A;  . AMPUTATION Right 04/01/2018   Procedure: AMPUTATION BELOW KNEE;  Surgeon: Algernon Huxley, MD;  Location: [facility] ORS;  Service: Vascular;  Laterality: Right;  . APPENDECTOMY    . CARDIAC CATHETERIZATION  10/14   [facility]; X1 STENT PROXIMAL RCA  . CARDIAC CATHETERIZATION  02/2010   [facility]  . CARDIAC CATHETERIZATION  02/21/2014   [facility]  . CLAVICLE SURGERY    . HERNIA REPAIR    . IRRIGATION AND DEBRIDEMENT FOOT Right 01/04/2018   Procedure: IRRIGATION AND DEBRIDEMENT FOOT;  Surgeon: Samara Deist, DPM;  Location: [facility] OR[facility];  Service: Podiatry;  Laterality: Right;  . LEFT HEART CATH AND CORS/GRAFTS ANGIOGRAPHY N/A 04/28/2017   Procedure: LEFT HEART CATH AND CORONARY ANGIOGRAPHY;  Surgeon: Fletcher Anon,  Mertie Clause, MD;  Location: [place] [facility];  Service: Cardiovascular;  Laterality: N/A;  . LOWER EXTREMITY ANGIOGRAPHY Right 03/03/2018   Procedure: LOWER EXTREMITY ANGIOGRAPHY;  Surgeon: Katha Cabal, MD;  Location: [place] [facility];  Service: Cardiovascular;  Laterality: Right;  . PERIPHERAL ARTERIAL STENT GRAFT     x2 left/right  . PERIPHERAL VASCULAR BALLOON ANGIOPLASTY Right 01/06/2018   Procedure: PERIPHERAL VASCULAR BALLOON ANGIOPLASTY;  Surgeon: Katha Cabal, MD;  Location: [place] [facility];  Service: Cardiovascular;  Laterality: Right;  . PERIPHERAL VASCULAR CATHETERIZATION N/A 07/06/2015   Procedure: Abdominal Aortogram w/Lower Extremity;  Surgeon: Algernon Huxley, MD;  Location: [place] [facility];  Service: Cardiovascular;  Laterality: N/A;  . PERIPHERAL VASCULAR CATHETERIZATION  07/06/2015   Procedure: Lower Extremity Intervention;  Surgeon: Algernon Huxley, MD;  Location: [place] [facility];  Service: Cardiovascular;;    Social History   Socioeconomic History  . Marital status: Single    Spouse name: Not on file  . Number of children: 0  . Years of education: 35  . Highest education level: 12th grade  Occupational History  . Occupation: on disability  Social Needs  . Financial resource strain: Somewhat hard  . Food insecurity:    Worry: Sometimes true    Inability: Sometimes true  . Transportation needs:    Medical: No    Non-medical: No  Tobacco Use  . Smoking status: Current Every Day Smoker    Packs/day: 1.00    Years: 41.00    Pack years: 41.00    Types: Cigarettes  . Smokeless tobacco: Never Used  Substance and Sexual Activity  . Alcohol use: No  . Drug use: No  . Sexual activity: Never  Lifestyle  . Physical activity:    Days per week: 7 days    Minutes per session: 30 min  . Stress: Very much  Relationships  . Social connections:    Talks on phone: More than three times a week    Gets together: Once a week    Attends religious service: Never    Active member of club or organization: No    Attends meetings of clubs or organizations: Never    Relationship status: Living with partner  . Intimate partner violence:    Fear of current or ex partner: No    Emotionally abused: No    Physically abused: No    Forced sexual activity: No  Other Topics Concern  . Not on file  Social History Narrative   Lives at home in [place] with family. Independent at baseline.    Family History  Problem Relation Age of Onset  . Heart attack Father   . Heart disease Father   . Hypertension Father   . Hyperlipidemia Father   . Hypertension Mother   . Hyperlipidemia Mother     Allergies  Allergen Reactions  .  Dulaglutide Anaphylaxis, Diarrhea and Hives  . Prednisone Rash     Review of Systems   Review of Systems: Negative Unless Checked Constitutional: [] Weight loss  [] Fever  [] Chills Cardiac: [] Chest pain   []  Atrial Fibrillation  [] Palpitations   [] Shortness of breath when laying flat   [] Shortness of breath with exertion. Vascular:  [] Pain in legs with walking   [] Pain in legs with standing  [] History of DVT   [] Phlebitis   [] Swelling in legs   [] Varicose veins   [] Non-healing ulcers Pulmonary:   [] Uses home oxygen   [] Productive cough   []   Hemoptysis   [] Wheeze  [] COPD   [] Asthma Neurologic:  [] Dizziness   [] Seizures   [] History of stroke   [] History of TIA  [] Aphasia   [] Vissual changes   [] Weakness or numbness in arm   [] Weakness or numbness in leg Musculoskeletal:   [] Joint swelling   [] Joint pain   [] Low back pain  []  History of Knee Replacement Hematologic:  [] Easy bruising  [] Easy bleeding   [] Hypercoagulable state   [] Anemic Gastrointestinal:  [] Diarrhea   [] Vomiting  [] Gastroesophageal reflux/heartburn   [] Difficulty swallowing. Genitourinary:  [] Chronic kidney disease   [] Difficult urination  [] Anuric   [] Blood in urine Skin:  [] Rashes   [] Ulcers  Psychological:  [] History of anxiety   []  History of major depression  []  Memory Difficulties     Objective:   Physical Exam  BP 132/75 (BP Location: Right Arm, Patient Position: Sitting)   Pulse (!) 102   Resp 19   Ht 5\' 7"  (1.702 m)   Wt 220 lb (99.8 kg)   BMI 34.46 kg/m   Gen: WD/WN, NAD Head: [place]/AT, No temporalis wasting.  Ear/Nose/Throat: Hearing grossly intact, nares w/o erythema or drainage Eyes: PER, EOMI, sclera nonicteric.  Neck: Supple, no masses.  No JVD.  Pulmonary:  Good air movement, no use of accessory muscles.  Cardiac: RRR Vascular:  Vessel Right Left  Radial Palpable Palpable  Gastrointestinal: soft, non-distended. No guarding/no peritoneal signs.  Musculoskeletal: Right below-knee amputation.   Wheelchair-bound currently. Neurologic: Pain and light touch intact in extremities.  Symmetrical.  Speech is fluent. Motor exam as listed above. Psychiatric: Judgment intact, Mood & affect appropriate for pt's clinical situation. Dermatologic: No Venous rashes. No Ulcers Noted.  No changes consistent with cellulitis. Lymph : No Cervical lymphadenopathy, no lichenification or skin changes of chronic lymphedema.      Assessment & Plan:   1. 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[place]) Every other staple was removed today.  The wound shows evidence of good wound healing.  No areas of ulcerations or necrosis.  Patient will return in 1 week for the rest of the suture removal.   Current Outpatient Medications on File Prior to Visit  Medication Sig Dispense Refill  . aspirin EC 81 MG tablet Take 1 tablet (81 mg total) by mouth daily. 90 tablet 3  . atorvastatin (LIPITOR) 80 MG tablet Take 80 mg by mouth daily.    . calcium carbonate (OS-CAL) 1250 (500 Ca) MG chewable tablet Chew by mouth.    . calcium carbonate (TUMS - DOSED IN MG ELEMENTAL CALCIUM) 500 MG chewable tablet Chew 4 tablets by mouth daily as needed for indigestion or heartburn.    . clopidogrel (PLAVIX) 75 MG tablet Take 75 mg by mouth daily.     [name] ezetimibe (ZETIA) 10 MG tablet Take 10 mg by mouth daily.    . fluticasone furoate-vilanterol (BREO ELLIPTA) 100-25 MCG/INH AEPB Inhale into the lungs.    . furosemide (LASIX) 40 MG tablet Take 40 mg by mouth daily. May take twice daily only if needed    . gabapentin (NEURONTIN) 800 MG tablet Take 800 mg by mouth 4 (four) times daily.     . Insulin Glargine (LANTUS SOLOSTAR) 100 UNIT/ML Solostar Pen Inject 30 Units into the skin at bedtime. 15 mL 11  . insulin lispro (HUMALOG) 100 UNIT/ML injection Sliding Scale Insulin before meals using Novolog If BS is 0 to 70 no insulin and eat/glucose tablets/glucose get If BS 71 to 150 great! If BS 151 to  200, 2 Units Novolog If BS 201 to 250,  4 Units Novolog If BS 251 to 300, 6 Units Novolog If BS 301 to 350, 8 Units Novolog If BS 351 to 400, 10 Units Novolog If BS greater than 400, take 10 Units and Contact our office during office hour    . ipratropium-albuterol (DUONEB) 0.5-2.5 (3) MG/3ML SOLN Inhale 3 mLs into the lungs every 6 (six) hours as needed (wheezing/sob).     . magnesium oxide (MAG-OX) 400 MG tablet Take by mouth.    . metFORMIN (GLUCOPHAGE-XR) 500 MG 24 hr tablet Take 500 mg by mouth 2 (two) times daily.     . metoprolol succinate (TOPROL-XL) 25 MG 24 hr tablet Take by mouth.    . Multiple Vitamin (MULTI-VITAMINS) TABS Take by mouth.    . multivitamin-lutein (OCUVITE-LUTEIN) CAPS capsule Take 1 capsule by mouth daily. 30 capsule 0  . nitroGLYCERIN (NITRODUR - DOSED IN MG/24 HR) 0.4 mg/hr patch Place 0.4 mg onto the skin daily.    . nitroGLYCERIN (NITROSTAT) 0.4 MG SL tablet Place 0.4 mg under the tongue every 5 (five) minutes as needed for chest pain.    . Oxycodone HCl 10 MG TABS Take 10 mg by mouth every 6 (six) hours as needed (pain).     . potassium chloride (K-DUR) 10 MEQ tablet Take by mouth.    . ranitidine (ZANTAC) 150 MG tablet Take 300 mg by mouth daily as needed for heartburn.    . spironolactone (ALDACTONE) 25 MG tablet Take by mouth.    . traZODone (DESYREL) 50 MG tablet Take 50 mg by mouth at bedtime as needed for sleep.     [name] albuterol (PROVENTIL HFA;VENTOLIN HFA) 108 (90 BASE) MCG/ACT inhaler Inhale 2 puffs into the lungs every 6 (six) hours as needed for wheezing or shortness of breath. (Patient not taking: Reported on 04/28/2018) 1 Inhaler 2  . diphenhydrAMINE (BENADRYL) 25 MG tablet Take 25 mg by mouth daily as needed for allergies.     No current facility-administered medications on file prior to visit.     There are no Patient Instructions on file for this visit. Return in about 1 week (around 05/05/2018).   Kris Hartmann, NP  This note was completed with Sales executive.  Any errors are  purely unintentional.

## 2018-05-05 ENCOUNTER — Ambulatory Visit (INDEPENDENT_AMBULATORY_CARE_PROVIDER_SITE_OTHER): Payer: Medicaid Other | Admitting: Nurse Practitioner

## 2018-05-05 ENCOUNTER — Encounter (INDEPENDENT_AMBULATORY_CARE_PROVIDER_SITE_OTHER): Payer: Self-pay | Admitting: Nurse Practitioner

## 2018-05-05 VITALS — BP 125/71 | HR 104 | Resp 17

## 2018-05-05 DIAGNOSIS — L03116 Cellulitis of left lower limb: Secondary | ICD-10-CM

## 2018-05-05 DIAGNOSIS — I7025 Atherosclerosis of native arteries of other extremities with ulceration: Secondary | ICD-10-CM

## 2018-05-05 DIAGNOSIS — I890 Lymphedema, not elsewhere classified: Secondary | ICD-10-CM

## 2018-05-05 DIAGNOSIS — F17210 Nicotine dependence, cigarettes, uncomplicated: Secondary | ICD-10-CM

## 2018-05-05 MED ORDER — DOXYCYCLINE HYCLATE 100 MG PO CAPS: 100.0000 mg | ORAL_CAPSULE | Freq: Two times a day (BID) | ORAL | 0 refills | Status: DC

## 2018-05-05 NOTE — Progress Notes (Signed)
Subjective:    Patient ID: [name] [name], male    DOB: December 22, 1960, 57 y.o.   MRN: [id number] Chief Complaint  Patient presents with  . Suture / Staple Removal    HPI  [name] [name] is a 57 y.o. male presents today for the removal of his staples from his right below the knee amputation.  Previously every other staple was removed from his right below the knee amputation.  He has no complaints of pain or drainage.  His left lower extremity however it is very swollen as well as erythematous.  Patient stated that this happened suddenly and it radiates to the front of his foot.  The patient states that it is not painful but it is hot to the touch.  He also endorses being dependent for a majority of the day.  The patient denies any fever, chills, nausea, vomiting or diarrhea.  Patient denies any chest pain or shortness of breath.  The patient denies any syncopal-like events.  Past Medical History:  Diagnosis Date  . Arthritis   . Asthma   . Atherosclerosis   . BPH (benign prostatic hyperplasia)   . Carotid arterial disease ([place])   . Charcot's joint of foot, right   . Chronic combined systolic (congestive) and diastolic (congestive) heart failure ([place])    a. 02/2013 EF 50% by LV gram; b. 04/2017 Echo: EF 25-30%. diff HK. Gr2 DD. Mod dil LA/RV. PASP 71mmHg.  [name] COPD (chronic obstructive pulmonary disease) ([place])   . Coronary artery disease    a. 2013 S/P PCI of LAD [facility]);  b. 03/2013 PCI: RCA 90p ( 2.5 x 23 mm DES); c. 02/2014 Cath: patent RCA stent->Med Rx; d. 04/2017 Cath: LM nl, LAd 30ost/p, 3m/d, D1 80ost, LCX 90p/m, OM1 85, RCA 70/20p, 34m, 70d->Referred for CT Surg-felt to be poor candidate.  . Diabetic neuropathy ([place])   . Diabetic ulcer of right foot ([place])   . Hernia   . Hyperlipidemia   . Hypertension   . Ischemic cardiomyopathy    a. 04/2017 Echo: EF 25-30%; b. 08/2017 Cardiac MRI ([facility]): EF 19%, sev glob HK. RVEF 20%, mod BAE, triv MR, mild-mod TR. Basal lateral subendocardial  infarct (viable), inf/infsept, dist septal ischemia, basal to mid lat peri-infarct ischemia.  . Morbid obesity ([place])   . Neuropathy   . PAD (peripheral artery disease) ([place])    a. Followed by Dr. Lucky Cowboy; b. 08/2010 Periph Angio: RSFA 70-80p (6X60 self-expanding stent); c. 09/2010 Periph Angio: L SFA 100p (7x unknown length self-expanding stent); d. 06/2015 Periph Angio: R SFA short segment occlusion (6x12 self-expanding stent).  . Restless leg syndrome   . Sleep apnea   . Subclavian artery stenosis, right ([place])   . Syncope and collapse   . Tobacco use    a. 75+ yr hx - still smoking 1ppd, down from 2 ppd.  . Type II diabetes mellitus ([place])   . Varicose vein     Past Surgical History:  Procedure Laterality Date  . ABDOMINAL AORTAGRAM N/A 06/23/2013   Procedure: ABDOMINAL Maxcine Ham;  Surgeon: Wellington Hampshire, MD;  Location: [facility]ATH [facility];  Service: Cardiovascular;  Laterality: N/A;  . AMPUTATION Right 04/01/2018   Procedure: AMPUTATION BELOW KNEE;  Surgeon: Algernon Huxley, MD;  Location: [facility] ORS;  Service: Vascular;  Laterality: Right;  . APPENDECTOMY    . CARDIAC CATHETERIZATION  10/14   [facility]; X1 STENT PROXIMAL RCA  . CARDIAC CATHETERIZATION  02/2010   [facility]  . CARDIAC CATHETERIZATION  02/21/2014   [facility]  . CLAVICLE SURGERY    . HERNIA REPAIR    . IRRIGATION AND DEBRIDEMENT FOOT Right 01/04/2018   Procedure: IRRIGATION AND DEBRIDEMENT FOOT;  Surgeon: Samara Deist, DPM;  Location: [facility] ORS;  Service: Podiatry;  Laterality: Right;  . LEFT HEART CATH AND CORS/GRAFTS ANGIOGRAPHY N/A 04/28/2017   Procedure: LEFT HEART CATH AND CORONARY ANGIOGRAPHY;  Surgeon: Wellington Hampshire, MD;  Location: [facility];  Service: Cardiovascular;  Laterality: N/A;  . LOWER EXTREMITY ANGIOGRAPHY Right 03/03/2018   Procedure: LOWER EXTREMITY ANGIOGRAPHY;  Surgeon: Katha Cabal, MD;  Location: [place] CV [facility];  Service: Cardiovascular;  Laterality: Right;  . PERIPHERAL ARTERIAL STENT GRAFT     x2  left/right  . PERIPHERAL VASCULAR BALLOON ANGIOPLASTY Right 01/06/2018   Procedure: PERIPHERAL VASCULAR BALLOON ANGIOPLASTY;  Surgeon: Katha Cabal, MD;  Location: [place];  Service: Cardiovascular;  Laterality: Right;  . PERIPHERAL VASCULAR CATHETERIZATION N/A 07/06/2015   Procedure: Abdominal Aortogram w/Lower Extremity;  Surgeon: Algernon Huxley, MD;  Location: [place] [facility];  Service: Cardiovascular;  Laterality: N/A;  . PERIPHERAL VASCULAR CATHETERIZATION  07/06/2015   Procedure: Lower Extremity Intervention;  Surgeon: Algernon Huxley, MD;  Location: [place] [facility];  Service: Cardiovascular;;    Social History   Socioeconomic History  . Marital status: Single    Spouse name: Not on file  . Number of children: 0  . Years of education: 36  . Highest education level: 12th grade  Occupational History  . Occupation: on disability  Social Needs  . Financial resource strain: Somewhat hard  . Food insecurity:    Worry: Sometimes true    Inability: Sometimes true  . Transportation needs:    Medical: No    Non-medical: No  Tobacco Use  . Smoking status: Current Every Day Smoker    Packs/day: 1.00    Years: 41.00    Pack years: 41.00    Types: Cigarettes  . Smokeless tobacco: Never Used  Substance and Sexual Activity  . Alcohol use: No  . Drug use: No  . Sexual activity: Never  Lifestyle  . Physical activity:    Days per week: 7 days    Minutes per session: 30 min  . Stress: Very much  Relationships  . Social connections:    Talks on phone: More than three times a week    Gets together: Once a week    Attends religious service: Never    Active member of club or organization: No    Attends meetings of clubs or organizations: Never    Relationship status: Living with partner  . Intimate partner violence:    Fear of current or ex partner: No    Emotionally abused: No    Physically abused: No    Forced sexual activity: No  Other Topics Concern  .  Not on file  Social History Narrative   Lives at home in [place] with family. Independent at baseline.    Family History  Problem Relation Age of Onset  . Heart attack Father   . Heart disease Father   . Hypertension Father   . Hyperlipidemia Father   . Hypertension Mother   . Hyperlipidemia Mother     Allergies  Allergen Reactions  . Dulaglutide Anaphylaxis, Diarrhea and Hives  . Prednisone Rash     Review of Systems   Review of Systems: Negative Unless Checked Constitutional: [] Weight loss  [] Fever  [] Chills Cardiac: []   Chest pain   []  Atrial Fibrillation  [] Palpitations   [] Shortness of breath when laying flat   [] Shortness of breath with exertion. Vascular:  [] Pain in legs with walking   [] Pain in legs with standing  [] History of DVT   [] Phlebitis   [x] Swelling in legs   [] Varicose veins   [] Non-healing ulcers Pulmonary:   [] Uses home oxygen   [] Productive cough   [] Hemoptysis   [] Wheeze  [] COPD   [] Asthma Neurologic:  [] Dizziness   [] Seizures   [] History of stroke   [] History of TIA  [] Aphasia   [] Vissual changes   [] Weakness or numbness in arm   [x] Weakness or numbness in leg Musculoskeletal:   [] Joint swelling   [x] Joint pain   [] Low back pain  []  History of Knee Replacement Hematologic:  [] Easy bruising  [] Easy bleeding   [] Hypercoagulable state   [] Anemic Gastrointestinal:  [] Diarrhea   [] Vomiting  [] Gastroesophageal reflux/heartburn   [] Difficulty swallowing. Genitourinary:  [] Chronic kidney disease   [] Difficult urination  [] Anuric   [] Blood in urine Skin:  [] Rashes   [] Ulcers  Psychological:  [] History of anxiety   []  History of major depression  []  Memory Difficulties     Objective:   Physical Exam  BP 125/71 (BP Location: Right Arm)   Pulse (!) 104   Resp 17   Gen: WD/WN, NAD Head: [place]/AT, No temporalis wasting.  Ear/Nose/Throat: Hearing grossly intact, nares w/o erythema or drainage Eyes: PER, EOMI, sclera nonicteric.  Neck: Supple, no masses.  No JVD.    Pulmonary:  Good air movement, no use of accessory muscles.  Cardiac: RRR Vascular:  2+ edema of the left lower extremity.  Right lower extremity has well approximated wound edges.  Very little serosanguineous drainage.  Several areas of scab.  Appears to be healing well Vessel Right Left  Radial Palpable Palpable   Gastrointestinal: soft, non-distended. No guarding/no peritoneal signs.  Musculoskeletal: M/S 5/5 throughout.    Right lower extremity below-knee amputation Neurologic: Pain and light touch intact in extremities.  Symmetrical.  Speech is fluent. Motor exam as listed above. Psychiatric: Judgment intact, Mood & affect appropriate for pt's clinical situation. Dermatologic: No Venous rashes. No Ulcers Noted.    Left lower extremity extremely erythematous, consistent with cellulitis Lymph : No Cervical lymphadenopathy, no lichenification or skin changes of chronic lymphedema.      Assessment & Plan:   1. Cellulitis of leg, left Patient presents today with a very erythematous and swollen left leg.  The patient and partner that he has been in a dependent position the majority of the time since his amputation.  Had a long discussion with the patient and his partner as to the cause of cellulitis as well as how it can be prevented.  We will do a 14-day course of doxycycline.  - doxycycline (VIBRAMYCIN) 100 MG capsule; Take 1 capsule (100 mg total) by mouth 2 (two) times daily.  Dispense: 28 capsule; Refill: 0  2. Atherosclerosis of native arteries of the extremities with ulceration ([place]) Has a history of peripheral artery disease and he has bilateral lower extremities prior to amputation.  Will obtain ABIs on his left lower extremity in order to ensure that will remain watchful of his peripheral vascular status.  This time the patient does not complain of any claudication or rest pain.  He has no open wounds or ulcerations, however he does have cellulitis of the left lower extremity. -  VAS [place] ABI WITH/WO TBI; Future  3. Lymphedema Patient had  significant edema of his left lower extremity today.  I reminded the patient about conservative therapy including wearing medical grade 1 compression stockings 20 to 30 mmHg, elevation, and exercise when possible.  The patient in partner admits that he remains in a dependent position for majority of the day which has likely led to increase in recent swelling.  Advised the patient to begin wearing compression stockings order to decrease the likelihood of recurrent cellulitis.   Current Outpatient Medications on File Prior to Visit  Medication Sig Dispense Refill  . aspirin EC 81 MG tablet Take 1 tablet (81 mg total) by mouth daily. 90 tablet 3  . atorvastatin (LIPITOR) 80 MG tablet Take 80 mg by mouth daily.    . calcium carbonate (OS-CAL) 1250 (500 Ca) MG chewable tablet Chew by mouth.    . calcium carbonate (TUMS - DOSED IN MG ELEMENTAL CALCIUM) 500 MG chewable tablet Chew 4 tablets by mouth daily as needed for indigestion or heartburn.    . clopidogrel (PLAVIX) 75 MG tablet Take 75 mg by mouth daily.     . diphenhydrAMINE (BENADRYL) 25 MG tablet Take 25 mg by mouth daily as needed for allergies.    [name] ezetimibe (ZETIA) 10 MG tablet Take 10 mg by mouth daily.    . fluticasone furoate-vilanterol (BREO ELLIPTA) 100-25 MCG/INH AEPB Inhale into the lungs.    . furosemide (LASIX) 40 MG tablet Take 40 mg by mouth daily. May take twice daily only if needed    . gabapentin (NEURONTIN) 800 MG tablet Take 800 mg by mouth 4 (four) times daily.     . Insulin Glargine (LANTUS SOLOSTAR) 100 UNIT/ML Solostar Pen Inject 30 Units into the skin at bedtime. 15 mL 11  . insulin lispro (HUMALOG) 100 UNIT/ML injection Sliding Scale Insulin before meals using Novolog If BS is 0 to 70 no insulin and eat/glucose tablets/glucose get If BS 71 to 150 great! If BS 151 to 200, 2 Units Novolog If BS 201 to 250, 4 Units Novolog If BS 251 to 300, 6 Units Novolog If BS 301  to 350, 8 Units Novolog If BS 351 to 400, 10 Units Novolog If BS greater than 400, take 10 Units and Contact our office during office hour    . ipratropium-albuterol (DUONEB) 0.5-2.5 (3) MG/3ML SOLN Inhale 3 mLs into the lungs every 6 (six) hours as needed (wheezing/sob).     . magnesium oxide (MAG-OX) 400 MG tablet Take by mouth.    . metFORMIN (GLUCOPHAGE-XR) 500 MG 24 hr tablet Take 500 mg by mouth 2 (two) times daily.     . metoprolol succinate (TOPROL-XL) 25 MG 24 hr tablet Take by mouth.    . Multiple Vitamin (MULTI-VITAMINS) TABS Take by mouth.    . multivitamin-lutein (OCUVITE-LUTEIN) CAPS capsule Take 1 capsule by mouth daily. 30 capsule 0  . nitroGLYCERIN (NITRODUR - DOSED IN MG/24 HR) 0.4 mg/hr patch Place 0.4 mg onto the skin daily.    . nitroGLYCERIN (NITROSTAT) 0.4 MG SL tablet Place 0.4 mg under the tongue every 5 (five) minutes as needed for chest pain.    . Oxycodone HCl 10 MG TABS Take 10 mg by mouth every 6 (six) hours as needed (pain).     . potassium chloride (K-DUR) 10 MEQ tablet Take by mouth.    . ranitidine (ZANTAC) 150 MG tablet Take 300 mg by mouth daily as needed for heartburn.    . spironolactone (ALDACTONE) 25 MG tablet Take by mouth.    [name]  traZODone (DESYREL) 50 MG tablet Take 50 mg by mouth at bedtime as needed for sleep.     [name] albuterol (PROVENTIL HFA;VENTOLIN HFA) 108 (90 BASE) MCG/ACT inhaler Inhale 2 puffs into the lungs every 6 (six) hours as needed for wheezing or shortness of breath. (Patient not taking: Reported on 04/28/2018) 1 Inhaler 2   No current facility-administered medications on file prior to visit.     There are no Patient Instructions on file for this visit. Return in about 3 months (around 08/05/2018).   Kris Hartmann, NP  This note was completed with Sales executive.  Any errors are purely unintentional.

## 2018-05-06 ENCOUNTER — Telehealth (INDEPENDENT_AMBULATORY_CARE_PROVIDER_SITE_OTHER): Payer: Self-pay | Admitting: Nurse Practitioner

## 2018-05-06 NOTE — Telephone Encounter (Signed)
The patient and his wife contacted me stating that he fell after trying to get into the shower.  He stated that when he fell all of the weight was on his stump he stated that it busted open and a certain spot.  It bled from several areas so he and his wife called the EMTs.   They were able to get the bleeding under control at the time I spoke with the patient and his wife.  I advised them to continue to monitor the site to ensure that the bleeding does not continue.  I advised him that if the bleeding should continue as well as if the pain becomes unbearable he should seek assistance in the emergency room.  I advised him to elevate his stump as well as to alternate with ibuprofen and Tylenol for pain relief.  I also advised him to contact [employer], the maker of his prosthetic to advise them that he would not be able to use a stump shrinker at this time.  I also advised him to contact home health because at this time he will continue to need their services.  I advised him that if there was exposed bone that there was a possibility that he would need to have a revision of his stump from a BKA to an AKA.  I also advised him that if it has truly reopen he will likely need some sort of wound VAC therapy in order to close the area.  We will have the patient follow-up next week in office to evaluate the wound to determine appropriate course of therapy.  The patient and his wife understood.

## 2018-05-12 ENCOUNTER — Ambulatory Visit (INDEPENDENT_AMBULATORY_CARE_PROVIDER_SITE_OTHER): Payer: Medicaid Other | Admitting: Vascular Surgery

## 2018-05-12 ENCOUNTER — Encounter (INDEPENDENT_AMBULATORY_CARE_PROVIDER_SITE_OTHER): Payer: Self-pay | Admitting: Vascular Surgery

## 2018-05-12 VITALS — BP 136/76 | HR 80 | Resp 19 | Ht 67.0 in | Wt 220.0 lb

## 2018-05-12 DIAGNOSIS — S88111A Complete traumatic amputation at level between knee and ankle, right lower leg, initial encounter: Secondary | ICD-10-CM

## 2018-05-12 DIAGNOSIS — E782 Mixed hyperlipidemia: Secondary | ICD-10-CM

## 2018-05-12 NOTE — Assessment & Plan Note (Signed)
lipid control important in reducing the progression of atherosclerotic disease. Continue statin therapy  

## 2018-05-12 NOTE — Assessment & Plan Note (Signed)
With his fall, fortunately he only developed a minimal separation.  There is no exposed bone.  There is no drainage.  There is no erythema.  I put a few more Steri-Strips on today.  He can resume all normal activities and this can get wet in 48 hours.  He has a scheduled follow-up visit in about 2 months with ABIs and we will see him at that time or sooner if problems develop in the interim.

## 2018-05-12 NOTE — Progress Notes (Signed)
Patient ID: [name] [name], male   DOB: Oct 10, 1960, 57 y.o.   MRN: [id number]  Chief Complaint  Patient presents with  . Follow-up    Leg drainage and pain after fall    HPI [name] [name] is a 57 y.o. male.  Patient returns prior to scheduled follow up visit for a fall.  He hit his stump trying to get into the shower.  Fortunately, the wound has only minimally separated.  There is no drainage or erythema.     Past Medical History:  Diagnosis Date  . Arthritis   . Asthma   . Atherosclerosis   . BPH (benign prostatic hyperplasia)   . Carotid arterial disease ([place])   . Charcot's joint of foot, right   . Chronic combined systolic (congestive) and diastolic (congestive) heart failure ([place])    a. 02/2013 EF 50% by LV gram; b. 04/2017 Echo: EF 25-30%. diff HK. Gr2 DD. Mod dil LA/RV. PASP 24mmHg.  [name] COPD (chronic obstructive pulmonary disease) ([place])   . Coronary artery disease    a. 2013 S/P PCI of LAD [facility]);  b. 03/2013 PCI: RCA 90p ( 2.5 x 23 mm DES); c. 02/2014 Cath: patent RCA stent->Med Rx; d. 04/2017 Cath: LM nl, LAd 30ost/p, 45m/d, D1 80ost, LCX 90p/m, OM1 85, RCA 70/20p, 67m, 70d->Referred for CT Surg-felt to be poor candidate.  . Diabetic neuropathy ([place])   . Diabetic ulcer of right foot ([place])   . Hernia   . Hyperlipidemia   . Hypertension   . Ischemic cardiomyopathy    a. 04/2017 Echo: EF 25-30%; b. 08/2017 Cardiac MRI ([facility]): EF 19%, sev glob HK. RVEF 20%, mod BAE, triv MR, mild-mod TR. Basal lateral subendocardial infarct (viable), inf/infsept, dist septal ischemia, basal to mid lat peri-infarct ischemia.  . Morbid obesity ([place])   . Neuropathy   . PAD (peripheral artery disease) ([place])    a. Followed by Dr. Lucky Cowboy; b. 08/2010 Periph Angio: RSFA 70-80p (6X60 self-expanding stent); c. 09/2010 Periph Angio: L SFA 100p (7x unknown length self-expanding stent); d. 06/2015 Periph Angio: R SFA short segment occlusion (6x12 self-expanding stent).  . Restless leg syndrome   . Sleep apnea     . Subclavian artery stenosis, right ([place])   . Syncope and collapse   . Tobacco use    a. 75+ yr hx - still smoking 1ppd, down from 2 ppd.  . Type II diabetes mellitus ([place])   . Varicose vein     Past Surgical History:  Procedure Laterality Date  . ABDOMINAL AORTAGRAM N/A 06/23/2013   Procedure: ABDOMINAL Maxcine Ham;  Surgeon: Wellington Hampshire, MD;  Location: [place] CATH LAB;  Service: Cardiovascular;  Laterality: N/A;  . AMPUTATION Right 04/01/2018   Procedure: AMPUTATION BELOW KNEE;  Surgeon: Algernon Huxley, MD;  Location: [facility] ORS;  Service: Vascular;  Laterality: Right;  . APPENDECTOMY    . CARDIAC CATHETERIZATION  10/14   [facility]; X1 STENT PROXIMAL RCA  . CARDIAC CATHETERIZATION  02/2010   [facility]  . CARDIAC CATHETERIZATION  02/21/2014   [facility]  . CLAVICLE SURGERY    . HERNIA REPAIR    . IRRIGATION AND DEBRIDEMENT FOOT Right 01/04/2018   Procedure: IRRIGATION AND DEBRIDEMENT FOOT;  Surgeon: Samara Deist, DPM;  Location: [facility] ORS;  Service: Podiatry;  Laterality: Right;  . LEFT HEART CATH AND CORS/GRAFTS ANGIOGRAPHY N/A 04/28/2017   Procedure: LEFT HEART CATH AND CORONARY ANGIOGRAPHY;  Surgeon: Wellington Hampshire, MD;  Location: [place] CV LAB;  Service:  Cardiovascular;  Laterality: N/A;  . LOWER EXTREMITY ANGIOGRAPHY Right 03/03/2018   Procedure: LOWER EXTREMITY ANGIOGRAPHY;  Surgeon: Katha Cabal, MD;  Location: [place] CV LAB;  Service: Cardiovascular;  Laterality: Right;  . PERIPHERAL ARTERIAL STENT GRAFT     x2 left/right  . PERIPHERAL VASCULAR BALLOON ANGIOPLASTY Right 01/06/2018   Procedure: PERIPHERAL VASCULAR BALLOON ANGIOPLASTY;  Surgeon: Katha Cabal, MD;  Location: [place] CV LAB;  Service: Cardiovascular;  Laterality: Right;  . PERIPHERAL VASCULAR CATHETERIZATION N/A 07/06/2015   Procedure: Abdominal Aortogram w/Lower Extremity;  Surgeon: Algernon Huxley, MD;  Location: [place] CV LAB;  Service: Cardiovascular;  Laterality: N/A;  . PERIPHERAL VASCULAR  CATHETERIZATION  07/06/2015   Procedure: Lower Extremity Intervention;  Surgeon: Algernon Huxley, MD;  Location: [place] CV LAB;  Service: Cardiovascular;;      Allergies  Allergen Reactions  . Dulaglutide Anaphylaxis, Diarrhea and Hives  . Prednisone Rash    Current Outpatient Medications  Medication Sig Dispense Refill  . albuterol (PROVENTIL HFA;VENTOLIN HFA) 108 (90 BASE) MCG/ACT inhaler Inhale 2 puffs into the lungs every 6 (six) hours as needed for wheezing or shortness of breath. 1 Inhaler 2  . aspirin EC 81 MG tablet Take 1 tablet (81 mg total) by mouth daily. 90 tablet 3  . atorvastatin (LIPITOR) 80 MG tablet Take 80 mg by mouth daily.    . calcium carbonate (OS-CAL) 1250 (500 Ca) MG chewable tablet Chew by mouth.    . calcium carbonate (TUMS - DOSED IN MG ELEMENTAL CALCIUM) 500 MG chewable tablet Chew 4 tablets by mouth daily as needed for indigestion or heartburn.    . clopidogrel (PLAVIX) 75 MG tablet Take 75 mg by mouth daily.     . diphenhydrAMINE (BENADRYL) 25 MG tablet Take 25 mg by mouth daily as needed for allergies.    [name] doxycycline (VIBRAMYCIN) 100 MG capsule Take 1 capsule (100 mg total) by mouth 2 (two) times daily. 28 capsule 0  . ezetimibe (ZETIA) 10 MG tablet Take 10 mg by mouth daily.    . fluticasone furoate-vilanterol (BREO ELLIPTA) 100-25 MCG/INH AEPB Inhale into the lungs.    . furosemide (LASIX) 40 MG tablet Take 40 mg by mouth daily. May take twice daily only if needed    . gabapentin (NEURONTIN) 800 MG tablet Take 800 mg by mouth 4 (four) times daily.     . Insulin Glargine (LANTUS SOLOSTAR) 100 UNIT/ML Solostar Pen Inject 30 Units into the skin at bedtime. 15 mL 11  . insulin lispro (HUMALOG) 100 UNIT/ML injection Sliding Scale Insulin before meals using Novolog If BS is 0 to 70 no insulin and eat/glucose tablets/glucose get If BS 71 to 150 great! If BS 151 to 200, 2 Units Novolog If BS 201 to 250, 4 Units Novolog If BS 251 to 300, 6 Units Novolog If BS  301 to 350, 8 Units Novolog If BS 351 to 400, 10 Units Novolog If BS greater than 400, take 10 Units and Contact our office during office hour    . ipratropium-albuterol (DUONEB) 0.5-2.5 (3) MG/3ML SOLN Inhale 3 mLs into the lungs every 6 (six) hours as needed (wheezing/sob).     . magnesium oxide (MAG-OX) 400 MG tablet Take by mouth.    . metFORMIN (GLUCOPHAGE-XR) 500 MG 24 hr tablet Take 500 mg by mouth 2 (two) times daily.     . metoprolol succinate (TOPROL-XL) 25 MG 24 hr tablet Take by mouth.    . Multiple Vitamin (  MULTI-VITAMINS) TABS Take by mouth.    . multivitamin-lutein (OCUVITE-LUTEIN) CAPS capsule Take 1 capsule by mouth daily. 30 capsule 0  . nitroGLYCERIN (NITRODUR - DOSED IN MG/24 HR) 0.4 mg/hr patch Place 0.4 mg onto the skin daily.    . nitroGLYCERIN (NITROSTAT) 0.4 MG SL tablet Place 0.4 mg under the tongue every 5 (five) minutes as needed for chest pain.    . Oxycodone HCl 10 MG TABS Take 10 mg by mouth every 6 (six) hours as needed (pain).     . potassium chloride (K-DUR) 10 MEQ tablet Take by mouth.    . promethazine (PHENERGAN) 25 MG tablet TAKE 1 TABLET BY MOUTH EVERY 6 HOURS AS NEEDED NAUSEA    . ranitidine (ZANTAC) 150 MG tablet Take 300 mg by mouth daily as needed for heartburn.    . spironolactone (ALDACTONE) 25 MG tablet Take by mouth.    . traZODone (DESYREL) 50 MG tablet Take 50 mg by mouth at bedtime as needed for sleep.      No current facility-administered medications for this visit.         Physical Exam BP 136/76 (BP Location: Right Arm, Patient Position: Sitting)   Pulse 80   Resp 19   Ht 5\' 7"  (1.702 m)   Wt 220 lb (99.8 kg)   BMI 34.46 kg/m  Gen:  WD/WN, NAD Skin: incision C/D/I with minimal separation on the lateral portion of the wound.  Less than a cm with no drainage or erythema.       Assessment/Plan:  HLD (hyperlipidemia) lipid control important in reducing the progression of atherosclerotic disease. Continue statin  therapy   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[place]) With his fall, fortunately he only developed a minimal separation.  There is no exposed bone.  There is no drainage.  There is no erythema.  I put a few more Steri-Strips on today.  He can resume all normal activities and this can get wet in 48 hours.  He has a scheduled follow-up visit in about 2 months with ABIs and we will see him at that time or sooner if problems develop in the interim.      Leotis Pain 05/12/2018, 3:53 PM   This note was created with Dragon medical transcription system.  Any errors from dictation are unintentional.

## 2018-07-27 ENCOUNTER — Inpatient Hospital Stay
Admission: EM | Admit: 2018-07-27 | Discharge: 2018-07-28 | DRG: 603 | Disposition: A | Discharge status: Home / Self Care | Payer: Medicare Other | Attending: Internal Medicine | Admitting: Internal Medicine

## 2018-07-27 ENCOUNTER — Other Ambulatory Visit: Payer: Self-pay

## 2018-07-27 ENCOUNTER — Encounter: Payer: Self-pay | Admitting: Emergency Medicine

## 2018-07-27 DIAGNOSIS — G4730 Sleep apnea, unspecified: Secondary | ICD-10-CM | POA: Diagnosis not present

## 2018-07-27 DIAGNOSIS — J449 Chronic obstructive pulmonary disease, unspecified: Secondary | ICD-10-CM | POA: Diagnosis present

## 2018-07-27 DIAGNOSIS — F17210 Nicotine dependence, cigarettes, uncomplicated: Secondary | ICD-10-CM | POA: Diagnosis present

## 2018-07-27 DIAGNOSIS — M7989 Other specified soft tissue disorders: Secondary | ICD-10-CM | POA: Diagnosis present

## 2018-07-27 DIAGNOSIS — G2581 Restless legs syndrome: Secondary | ICD-10-CM | POA: Diagnosis not present

## 2018-07-27 DIAGNOSIS — I2510 Atherosclerotic heart disease of native coronary artery without angina pectoris: Secondary | ICD-10-CM | POA: Diagnosis not present

## 2018-07-27 DIAGNOSIS — I5042 Chronic combined systolic (congestive) and diastolic (congestive) heart failure: Secondary | ICD-10-CM | POA: Diagnosis not present

## 2018-07-27 DIAGNOSIS — L03116 Cellulitis of left lower limb: Secondary | ICD-10-CM | POA: Diagnosis not present

## 2018-07-27 DIAGNOSIS — Z95820 Peripheral vascular angioplasty status with implants and grafts: Secondary | ICD-10-CM

## 2018-07-27 DIAGNOSIS — I255 Ischemic cardiomyopathy: Secondary | ICD-10-CM | POA: Diagnosis present

## 2018-07-27 DIAGNOSIS — Z8349 Family history of other endocrine, nutritional and metabolic diseases: Secondary | ICD-10-CM

## 2018-07-27 DIAGNOSIS — L0390 Cellulitis, unspecified: Secondary | ICD-10-CM

## 2018-07-27 DIAGNOSIS — Z794 Long term (current) use of insulin: Secondary | ICD-10-CM

## 2018-07-27 DIAGNOSIS — E785 Hyperlipidemia, unspecified: Secondary | ICD-10-CM | POA: Diagnosis present

## 2018-07-27 DIAGNOSIS — I252 Old myocardial infarction: Secondary | ICD-10-CM | POA: Diagnosis not present

## 2018-07-27 DIAGNOSIS — Z86718 Personal history of other venous thrombosis and embolism: Secondary | ICD-10-CM

## 2018-07-27 DIAGNOSIS — Z7902 Long term (current) use of antithrombotics/antiplatelets: Secondary | ICD-10-CM

## 2018-07-27 DIAGNOSIS — E1151 Type 2 diabetes mellitus with diabetic peripheral angiopathy without gangrene: Secondary | ICD-10-CM | POA: Diagnosis not present

## 2018-07-27 DIAGNOSIS — G894 Chronic pain syndrome: Secondary | ICD-10-CM | POA: Diagnosis not present

## 2018-07-27 DIAGNOSIS — Z716 Tobacco abuse counseling: Secondary | ICD-10-CM | POA: Diagnosis not present

## 2018-07-27 DIAGNOSIS — N400 Enlarged prostate without lower urinary tract symptoms: Secondary | ICD-10-CM | POA: Diagnosis not present

## 2018-07-27 DIAGNOSIS — Z79891 Long term (current) use of opiate analgesic: Secondary | ICD-10-CM

## 2018-07-27 DIAGNOSIS — E669 Obesity, unspecified: Secondary | ICD-10-CM | POA: Diagnosis present

## 2018-07-27 DIAGNOSIS — Z7982 Long term (current) use of aspirin: Secondary | ICD-10-CM

## 2018-07-27 DIAGNOSIS — I110 Hypertensive heart disease with heart failure: Secondary | ICD-10-CM | POA: Diagnosis not present

## 2018-07-27 DIAGNOSIS — E1142 Type 2 diabetes mellitus with diabetic polyneuropathy: Secondary | ICD-10-CM | POA: Diagnosis present

## 2018-07-27 DIAGNOSIS — Z8249 Family history of ischemic heart disease and other diseases of the circulatory system: Secondary | ICD-10-CM

## 2018-07-27 DIAGNOSIS — Z89511 Acquired absence of right leg below knee: Secondary | ICD-10-CM

## 2018-07-27 DIAGNOSIS — Z6833 Body mass index (BMI) 33.0-33.9, adult: Secondary | ICD-10-CM

## 2018-07-27 DIAGNOSIS — Z79899 Other long term (current) drug therapy: Secondary | ICD-10-CM

## 2018-07-27 DIAGNOSIS — Z792 Long term (current) use of antibiotics: Secondary | ICD-10-CM

## 2018-07-27 DIAGNOSIS — Z888 Allergy status to other drugs, medicaments and biological substances status: Secondary | ICD-10-CM

## 2018-07-27 DIAGNOSIS — Z955 Presence of coronary angioplasty implant and graft: Secondary | ICD-10-CM

## 2018-07-27 LAB — GLUCOSE, CAPILLARY
Glucose-Capillary: 161 mg/dL — ABNORMAL HIGH (ref 70–99)
Glucose-Capillary: 101 mg/dL — ABNORMAL HIGH (ref 70–99)

## 2018-07-27 LAB — CBC WITH DIFFERENTIAL/PLATELET
Abs Immature Granulocytes: 0.06 10*3/uL (ref 0.00–0.07)
Basophils Absolute: 0.1 10*3/uL (ref 0.0–0.1)
Basophils Relative: 0 %
Eosinophils Absolute: 0.3 10*3/uL (ref 0.0–0.5)
Eosinophils Relative: 2 %
HCT: 44.6 % (ref 39.0–52.0)
Hemoglobin: 14.3 g/dL (ref 13.0–17.0)
Immature Granulocytes: 0 %
Lymphocytes Relative: 20 %
Lymphs Abs: 2.9 10*3/uL (ref 0.7–4.0)
MCH: 24.4 pg — ABNORMAL LOW (ref 26.0–34.0)
MCHC: 32.1 g/dL (ref 30.0–36.0)
MCV: 76 fL — ABNORMAL LOW (ref 80.0–100.0)
Monocytes Absolute: 0.8 10*3/uL (ref 0.1–1.0)
Monocytes Relative: 6 %
Neutro Abs: 10.4 10*3/uL — ABNORMAL HIGH (ref 1.7–7.7)
Neutrophils Relative %: 72 %
Platelets: 335 10*3/uL (ref 150–400)
RBC: 5.87 MIL/uL — ABNORMAL HIGH (ref 4.22–5.81)
RDW: 18.5 % — ABNORMAL HIGH (ref 11.5–15.5)
WBC: 14.4 10*3/uL — ABNORMAL HIGH (ref 4.0–10.5)
nRBC: 0 % (ref 0.0–0.2)

## 2018-07-27 LAB — BASIC METABOLIC PANEL
Anion gap: 7 (ref 5–15)
BUN: 17 mg/dL (ref 6–20)
CO2: 32 mmol/L (ref 22–32)
Calcium: 8.7 mg/dL — ABNORMAL LOW (ref 8.9–10.3)
Chloride: 95 mmol/L — ABNORMAL LOW (ref 98–111)
Creatinine, Ser: 0.97 mg/dL (ref 0.61–1.24)
GFR calc Af Amer: >60 mL/min (ref >60)
GFR calc non Af Amer: >60 mL/min (ref >60)
Glucose, Bld: 165 mg/dL — ABNORMAL HIGH (ref 70–99)
Potassium: 3.5 mmol/L (ref 3.5–5.1)
Sodium: 134 mmol/L — ABNORMAL LOW (ref 135–145)

## 2018-07-27 LAB — LACTIC ACID, PLASMA
Lactic Acid, Venous: 1.6 mmol/L (ref 0.5–1.9)
Lactic Acid, Venous: 0.9 mmol/L (ref 0.5–1.9)

## 2018-07-27 MED ORDER — METOPROLOL SUCCINATE ER 25 MG PO TB24
25.0000 mg | ORAL_TABLET | Freq: Every day | ORAL | Status: DC
  Administered 2018-07-28: 09:00:00 25 mg via ORAL
  Filled 2018-07-27: qty 1

## 2018-07-27 MED ORDER — ATORVASTATIN CALCIUM 20 MG PO TABS
80.0000 mg | ORAL_TABLET | Freq: Every day | ORAL | Status: DC
  Administered 2018-07-28: 09:00:00 80 mg via ORAL
  Filled 2018-07-27: qty 4

## 2018-07-27 MED ORDER — SODIUM CHLORIDE 0.9 % IV SOLN
1.5000 g | Freq: Four times a day (QID) | INTRAVENOUS | Status: DC
  Administered 2018-07-28 (×2): 1.5 g via INTRAVENOUS
  Filled 2018-07-27 (×5): qty 1.5

## 2018-07-27 MED ORDER — SPIRONOLACTONE 25 MG PO TABS
25.0000 mg | ORAL_TABLET | Freq: Every day | ORAL | Status: DC
  Administered 2018-07-28: 09:00:00 25 mg via ORAL
  Filled 2018-07-27: qty 1

## 2018-07-27 MED ORDER — NITROGLYCERIN 0.4 MG SL SUBL: 0.4000 mg | SUBLINGUAL_TABLET | SUBLINGUAL | Status: DC | PRN

## 2018-07-27 MED ORDER — OXYCODONE HCL 5 MG PO TABS
10.0000 mg | ORAL_TABLET | Freq: Four times a day (QID) | ORAL | Status: DC | PRN
  Administered 2018-07-28 (×2): 10 mg via ORAL
  Filled 2018-07-27 (×2): qty 2

## 2018-07-27 MED ORDER — FAMOTIDINE 20 MG PO TABS
20.0000 mg | ORAL_TABLET | Freq: Two times a day (BID) | ORAL | Status: DC
  Administered 2018-07-28 (×2): 20 mg via ORAL
  Filled 2018-07-27 (×2): qty 1

## 2018-07-27 MED ORDER — CALCIUM CARBONATE 1250 (500 CA) MG PO CHEW: 1250.0000 mg | CHEWABLE_TABLET | Freq: Every day | ORAL | Status: DC

## 2018-07-27 MED ORDER — EZETIMIBE 10 MG PO TABS
10.0000 mg | ORAL_TABLET | Freq: Every day | ORAL | Status: DC
  Administered 2018-07-28: 09:00:00 10 mg via ORAL
  Filled 2018-07-27: qty 1

## 2018-07-27 MED ORDER — TRAZODONE HCL 50 MG PO TABS: 50.0000 mg | ORAL_TABLET | Freq: Every evening | ORAL | Status: DC | PRN

## 2018-07-27 MED ORDER — GABAPENTIN 400 MG PO CAPS
800.0000 mg | ORAL_CAPSULE | Freq: Four times a day (QID) | ORAL | Status: DC
  Administered 2018-07-28 (×3): 800 mg via ORAL
  Filled 2018-07-27 (×4): qty 2
  Filled 2018-07-27: qty 8
  Filled 2018-07-27: qty 2

## 2018-07-27 MED ORDER — HEPARIN SODIUM (PORCINE) 5000 UNIT/ML IJ SOLN
5000.0000 [IU] | Freq: Three times a day (TID) | INTRAMUSCULAR | Status: DC
  Administered 2018-07-28: 06:00:00 5000 [IU] via SUBCUTANEOUS
  Filled 2018-07-27: qty 1

## 2018-07-27 MED ORDER — METFORMIN HCL ER 500 MG PO TB24
500.0000 mg | ORAL_TABLET | Freq: Two times a day (BID) | ORAL | Status: DC
  Administered 2018-07-28 (×2): 500 mg via ORAL
  Filled 2018-07-27 (×3): qty 1

## 2018-07-27 MED ORDER — DOCUSATE SODIUM 100 MG PO CAPS: 100.0000 mg | ORAL_CAPSULE | Freq: Two times a day (BID) | ORAL | Status: DC | PRN

## 2018-07-27 MED ORDER — ALBUTEROL SULFATE (2.5 MG/3ML) 0.083% IN NEBU
3.0000 mL | INHALATION_SOLUTION | Freq: Four times a day (QID) | RESPIRATORY_TRACT | Status: DC | PRN
  Administered 2018-07-28: 10:00:00 3 mL via RESPIRATORY_TRACT
  Filled 2018-07-27: qty 3

## 2018-07-27 MED ORDER — CLOPIDOGREL BISULFATE 75 MG PO TABS
75.0000 mg | ORAL_TABLET | Freq: Every day | ORAL | Status: DC
  Administered 2018-07-28: 09:00:00 75 mg via ORAL
  Filled 2018-07-27: qty 1

## 2018-07-27 MED ORDER — PIPERACILLIN-TAZOBACTAM 3.375 G IVPB 30 MIN
3.3750 g | Freq: Once | INTRAVENOUS | Status: AC
  Administered 2018-07-27: 3.375 g via INTRAVENOUS
  Filled 2018-07-27: qty 50

## 2018-07-27 MED ORDER — VANCOMYCIN HCL IN DEXTROSE 1-5 GM/200ML-% IV SOLN
1000.0000 mg | Freq: Once | INTRAVENOUS | Status: AC
  Administered 2018-07-27: 1000 mg via INTRAVENOUS
  Filled 2018-07-27: qty 200

## 2018-07-27 MED ORDER — CALCIUM CARBONATE ANTACID 500 MG PO CHEW
2.5000 | CHEWABLE_TABLET | Freq: Every day | ORAL | Status: DC
  Administered 2018-07-28: 09:00:00 500 mg via ORAL
  Filled 2018-07-27: qty 3

## 2018-07-27 MED ORDER — ASPIRIN EC 81 MG PO TBEC
81.0000 mg | DELAYED_RELEASE_TABLET | Freq: Every day | ORAL | Status: DC
  Administered 2018-07-28: 09:00:00 81 mg via ORAL
  Filled 2018-07-27: qty 1

## 2018-07-27 MED ORDER — INSULIN GLARGINE 100 UNIT/ML ~~LOC~~ SOLN
30.0000 [IU] | Freq: Every day | SUBCUTANEOUS | Status: DC
  Filled 2018-07-27 (×2): qty 0.3

## 2018-07-27 MED ORDER — MAGNESIUM OXIDE 400 (241.3 MG) MG PO TABS
400.0000 mg | ORAL_TABLET | Freq: Every day | ORAL | Status: DC
  Administered 2018-07-28: 400 mg via ORAL
  Filled 2018-07-27: qty 1

## 2018-07-27 MED ORDER — FUROSEMIDE 40 MG PO TABS
40.0000 mg | ORAL_TABLET | Freq: Every day | ORAL | Status: DC
  Administered 2018-07-28: 40 mg via ORAL
  Filled 2018-07-27: qty 1

## 2018-07-27 MED ORDER — CALCIUM CARBONATE ANTACID 500 MG PO CHEW: 4.0000 | CHEWABLE_TABLET | Freq: Every day | ORAL | Status: DC | PRN

## 2018-07-27 MED ORDER — INSULIN ASPART 100 UNIT/ML ~~LOC~~ SOLN: 0.0000 [IU] | Freq: Every day | SUBCUTANEOUS | Status: DC

## 2018-07-27 MED ORDER — INSULIN ASPART 100 UNIT/ML ~~LOC~~ SOLN
0.0000 [IU] | Freq: Three times a day (TID) | SUBCUTANEOUS | Status: DC
  Administered 2018-07-28: 1 [IU] via SUBCUTANEOUS
  Filled 2018-07-27: qty 1

## 2018-07-27 MED ORDER — INSULIN GLARGINE 100 UNIT/ML SOLOSTAR PEN: 30.0000 [IU] | PEN_INJECTOR | Freq: Every day | SUBCUTANEOUS | Status: DC

## 2018-07-27 NOTE — ED Provider Notes (Addendum)
[facility] Emergency Department Provider Note  ____________________________________________   I have reviewed the triage vital signs and the nursing notes. Where available I have reviewed prior notes and, if possible and indicated, outside hospital notes.    HISTORY  Chief Complaint Cellulitis and Leg Swelling    HPI [name] [name] is a 58 y.o. male presents today complaining of left lower leg swelling and warmth.  He has had some chills but no other systemic symptoms.  He does have a history of COPD and continues to smoke.  He is been on Levaquin for cough, the cough is improving, the redness in his legs however started last Wednesday and has been getting worse despite the antibiotics.  He has had no fever that he knows of.  His primary care doctor sent him in here because of concern for possible systemic infection associated with this cellulitic process that seems to be going on in his left foot.  Patient does not have a history of blood clots he states but he states he lost his other foot because of a diabetic foot ulcer which persisted.  Past Medical History:  Diagnosis Date  . Arthritis   . Asthma   . Atherosclerosis   . BPH (benign prostatic hyperplasia)   . Carotid arterial disease ([place])   . Charcot's joint of foot, right   . Chronic combined systolic (congestive) and diastolic (congestive) heart failure ([place])    a. 02/2013 EF 50% by LV gram; b. 04/2017 Echo: EF 25-30%. diff HK. Gr2 DD. Mod dil LA/RV. PASP 42mmHg.  [name] COPD (chronic obstructive pulmonary disease) ([place])   . Coronary artery disease    a. 2013 S/P PCI of LAD [facility]);  b. 03/2013 PCI: RCA 90p ( 2.5 x 23 mm DES); c. 02/2014 Cath: patent RCA stent->Med Rx; d. 04/2017 Cath: LM nl, LAd 30ost/p, 19m/d, D1 80ost, LCX 90p/m, OM1 85, RCA 70/20p, 45m, 70d->Referred for CT Surg-felt to be poor candidate.  . Diabetic neuropathy ([place])   . Diabetic ulcer of right foot ([place])   . Hernia   . Hyperlipidemia    . Hypertension   . Ischemic cardiomyopathy    a. 04/2017 Echo: EF 25-30%; b. 08/2017 Cardiac MRI ([facility]): EF 19%, sev glob HK. RVEF 20%, mod BAE, triv MR, mild-mod TR. Basal lateral subendocardial infarct (viable), inf/infsept, dist septal ischemia, basal to mid lat peri-infarct ischemia.  . Morbid obesity ([place])   . Neuropathy   . PAD (peripheral artery disease) ([place])    a. Followed by Dr. Lucky Cowboy; b. 08/2010 Periph Angio: RSFA 70-80p (6X60 self-expanding stent); c. 09/2010 Periph Angio: L SFA 100p (7x unknown length self-expanding stent); d. 06/2015 Periph Angio: R SFA short segment occlusion (6x12 self-expanding stent).  . Restless leg syndrome   . Sleep apnea   . Subclavian artery stenosis, right ([place])   . Syncope and collapse   . Tobacco use    a. 75+ yr hx - still smoking 1ppd, down from 2 ppd.  . Type II diabetes mellitus ([place])   . Varicose vein     Patient Active Problem List   Diagnosis Date Noted  . 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[place]) 04/28/2018  . Sepsis ([place]) 03/30/2018  . Abnormal MRI, shoulder (Right) 02/23/2018  . Abnormal MRI, cervical spine (2016) 02/23/2018  . DDD (degenerative disc disease), cervical 02/23/2018  . Cervical foraminal stenosis (Bilateral) 02/23/2018  . Cervical central spinal stenosis 02/23/2018  . Cervical facet hypertrophy 02/23/2018  . Arthralgia of acromioclavicular  joint (Right) 02/23/2018  . Osteoarthritis of  AC (acromioclavicular) joint (Right) 02/23/2018  . Biceps tendinosis of shoulder (Right) 02/23/2018  . Tendinopathy of rotator cuff (Right) 02/23/2018  . Vitamin D deficiency 02/23/2018  . Atherosclerosis of native arteries of the extremities with ulceration ([place]) 02/15/2018  . Peripheral edema 02/05/2018  . Status post peripherally inserted central catheter (PICC) central line placement 02/04/2018  . Chronic lower extremity pain (Primary Area of Pain) (Right) 01/29/2018  . Chronic shoulder pain [facility] Area of Pain) (Right)  01/29/2018  . Chronic foot pain (Secondary Area of Pain) (Right) 01/29/2018  . Chronic pain syndrome 01/29/2018  . Long term current use of opiate analgesic 01/29/2018  . Medication monitoring encounter 01/29/2018  . Disorder of skeletal system 01/29/2018  . Problems influencing health status 01/29/2018  . AKI (acute kidney injury) ([place]) 01/03/2018  . NSTEMI (non-ST elevated myocardial infarction) ([place]) 11/21/2017  . Coronary artery disease of native artery of native heart with stable angina pectoris ([place]) 09/10/2017  . HLD (hyperlipidemia) 09/10/2017  . Angina pectoris ([place]) 09/08/2017  . Angina at rest [facility]) 08/13/2017  . Lymphedema 05/02/2017  . Chronic systolic HF (heart failure) ([place]) 04/23/2017  . Chest pain 07/11/2016  . Unstable angina ([place]) 07/11/2016  . Foot ulcer (Right)   . Stable angina (HCC)   . Pressure injury of skin 05/06/2016  . Diabetic foot infection ([place]) 05/05/2016  . Type 2 diabetes mellitus with complication, with long-term current use of insulin ([place]) 01/26/2016  . Morbid obesity ([place]) 11/17/2014  . PAD (peripheral artery disease) ([place])   . Hypertension   . Tobacco use   . COPD (chronic obstructive pulmonary disease) ([place]) 05/19/2012  . Diabetic neuropathy ([place]) 05/19/2012    Past Surgical History:  Procedure Laterality Date  . ABDOMINAL AORTAGRAM N/A 06/23/2013   Procedure: ABDOMINAL Maxcine Ham;  Surgeon: Wellington Hampshire, MD;  Location: [place] CATH LAB;  Service: Cardiovascular;  Laterality: N/A;  . AMPUTATION Right 04/01/2018   Procedure: AMPUTATION BELOW KNEE;  Surgeon: Algernon Huxley, MD;  Location: [facility] ORS;  Service: Vascular;  Laterality: Right;  . APPENDECTOMY    . CARDIAC CATHETERIZATION  10/14   [facility]; X1 STENT PROXIMAL RCA  . CARDIAC CATHETERIZATION  02/2010   [facility]  . CARDIAC CATHETERIZATION  02/21/2014   [facility]  . CLAVICLE SURGERY    . HERNIA REPAIR    . IRRIGATION AND DEBRIDEMENT FOOT Right 01/04/2018   Procedure: IRRIGATION AND DEBRIDEMENT FOOT;   Surgeon: Samara Deist, DPM;  Location: [facility] ORS;  Service: Podiatry;  Laterality: Right;  . LEFT HEART CATH AND CORS/GRAFTS ANGIOGRAPHY N/A 04/28/2017   Procedure: LEFT HEART CATH AND CORONARY ANGIOGRAPHY;  Surgeon: Wellington Hampshire, MD;  Location: [place] CV LAB;  Service: Cardiovascular;  Laterality: N/A;  . LOWER EXTREMITY ANGIOGRAPHY Right 03/03/2018   Procedure: LOWER EXTREMITY ANGIOGRAPHY;  Surgeon: Katha Cabal, MD;  Location: [place] CV LAB;  Service: Cardiovascular;  Laterality: Right;  . PERIPHERAL ARTERIAL STENT GRAFT     x2 left/right  . PERIPHERAL VASCULAR BALLOON ANGIOPLASTY Right 01/06/2018   Procedure: PERIPHERAL VASCULAR BALLOON ANGIOPLASTY;  Surgeon: Katha Cabal, MD;  Location: [place] CV LAB;  Service: Cardiovascular;  Laterality: Right;  . PERIPHERAL VASCULAR CATHETERIZATION N/A 07/06/2015   Procedure: Abdominal Aortogram w/Lower Extremity;  Surgeon: Algernon Huxley, MD;  Location: [place] CV LAB;  Service: Cardiovascular;  Laterality: N/A;  . PERIPHERAL VASCULAR CATHETERIZATION  07/06/2015   Procedure: Lower Extremity Intervention;  Surgeon: Algernon Huxley,  MD;  Location: [place] CV LAB;  Service: Cardiovascular;;    Prior to Admission medications   Medication Sig Start Date End Date Taking? Authorizing Provider  albuterol (PROVENTIL HFA;VENTOLIN HFA) 108 (90 BASE) MCG/ACT inhaler Inhale 2 puffs into the lungs every 6 (six) hours as needed for wheezing or shortness of breath. 04/15/13   Wellington Hampshire, MD  aspirin EC 81 MG tablet Take 1 tablet (81 mg total) by mouth daily. 05/11/15   Wellington Hampshire, MD  atorvastatin (LIPITOR) 80 MG tablet Take 80 mg by mouth daily.    [provider]  calcium carbonate (OS-CAL) 1250 (500 Ca) MG chewable tablet Chew by mouth.    [provider]  calcium carbonate (TUMS - DOSED IN MG ELEMENTAL CALCIUM) 500 MG chewable tablet Chew 4 tablets by mouth daily as needed for indigestion or  heartburn.    [provider]  clopidogrel (PLAVIX) 75 MG tablet Take 75 mg by mouth daily.  08/22/17   [provider]  diphenhydrAMINE (BENADRYL) 25 MG tablet Take 25 mg by mouth daily as needed for allergies.    [provider]  doxycycline (VIBRAMYCIN) 100 MG capsule Take 1 capsule (100 mg total) by mouth 2 (two) times daily. 05/05/18   Kris Hartmann, NP  ezetimibe (ZETIA) 10 MG tablet Take 10 mg by mouth daily.    [provider]  fluticasone furoate-vilanterol (BREO ELLIPTA) 100-25 MCG/INH AEPB Inhale into the lungs. 04/14/18   [provider]  furosemide (LASIX) 40 MG tablet Take 40 mg by mouth daily. May take twice daily only if needed 01/15/18   [provider]  gabapentin (NEURONTIN) 800 MG tablet Take 800 mg by mouth 4 (four) times daily.  08/14/17 08/14/18  [provider]  Insulin Glargine (LANTUS SOLOSTAR) 100 UNIT/ML Solostar Pen Inject 30 Units into the skin at bedtime. 04/07/18   Mayo, Pete Pelt, MD  insulin lispro (HUMALOG) 100 UNIT/ML injection Sliding Scale Insulin before meals using Novolog If BS is 0 to 70 no insulin and eat/glucose tablets/glucose get If BS 71 to 150 great! If BS 151 to 200, 2 Units Novolog If BS 201 to 250, 4 Units Novolog If BS 251 to 300, 6 Units Novolog If BS 301 to 350, 8 Units Novolog If BS 351 to 400, 10 Units Novolog If BS greater than 400, take 10 Units and Contact our office during office hour 04/14/18   [provider]  ipratropium-albuterol (DUONEB) 0.5-2.5 (3) MG/3ML SOLN Inhale 3 mLs into the lungs every 6 (six) hours as needed (wheezing/sob).     [provider]  magnesium oxide (MAG-OX) 400 MG tablet Take by mouth. 04/16/18 04/16/19  [provider]  metFORMIN (GLUCOPHAGE-XR) 500 MG 24 hr tablet Take 500 mg by mouth 2 (two) times daily.     [provider]  metoprolol succinate (TOPROL-XL) 25 MG 24 hr tablet Take by mouth. 03/19/18 03/19/19  [provider]  Multiple Vitamin (MULTI-VITAMINS) TABS Take by mouth.    [provider]  multivitamin-lutein (OCUVITE-LUTEIN) CAPS capsule Take 1 capsule by mouth daily. 01/09/18   Demetrios Loll, MD  nitroGLYCERIN (NITRODUR - DOSED IN MG/24 HR) 0.4 mg/hr patch Place 0.4 mg onto the skin daily.    [provider]  nitroGLYCERIN (NITROSTAT) 0.4 MG SL tablet Place 0.4 mg under the tongue every 5 (five) minutes as needed for chest pain.    [provider]  Oxycodone HCl 10 MG TABS Take 10 mg by  mouth every 6 (six) hours as needed (pain).  01/15/18   [provider]  potassium chloride (K-DUR) 10 MEQ tablet Take by mouth. 02/24/18 02/24/19  [provider]  promethazine (PHENERGAN) 25 MG tablet TAKE 1 TABLET BY MOUTH EVERY 6 HOURS AS NEEDED NAUSEA 04/29/18   [provider]  ranitidine (ZANTAC) 150 MG tablet Take 300 mg by mouth daily as needed for heartburn.    [provider]  spironolactone (ALDACTONE) 25 MG tablet Take by mouth. 03/02/18   [provider]  traZODone (DESYREL) 50 MG tablet Take 50 mg by mouth at bedtime as needed for sleep.  08/12/17   [provider]    Allergies Dulaglutide and Prednisone  Family History  Problem Relation Age of Onset  . Heart attack Father   . Heart disease Father   . Hypertension Father   . Hyperlipidemia Father   . Hypertension Mother   . Hyperlipidemia Mother     Social History Social History   Tobacco Use  . Smoking status: Current Every Day Smoker    Packs/day: 1.00    Years: 41.00    Pack years: 41.00    Types: Cigarettes  . Smokeless tobacco: Never Used  Substance Use Topics  . Alcohol use: No  . Drug use: No    Review of Systems Constitutional: No fever/chills Eyes: No visual changes. ENT: No sore throat. No stiff neck no neck pain Cardiovascular: Denies chest pain. Respiratory: Denies shortness of breath. Gastrointestinal:   no vomiting.  No diarrhea.  No  constipation. Genitourinary: Negative for dysuria. Musculoskeletal: + lower extremity swelling Skin: Negative for rash. Neurological: Negative for severe headaches, focal weakness or numbness.   ____________________________________________   PHYSICAL EXAM:  VITAL SIGNS: ED Triage Vitals  Enc Vitals Group     BP 07/27/18 1957 115/69     Pulse Rate 07/27/18 1957 100     Resp 07/27/18 1957 18     Temp 07/27/18 1957 97.8 F (36.6 C)     Temp Source 07/27/18 1957 Oral     SpO2 07/27/18 1957 100 %     Weight 07/27/18 1959 234 lb (106.1 kg)     Height 07/27/18 1959 5\' 7"  (1.702 m)     Head Circumference --      Peak Flow --      Pain Score 07/27/18 1958 8     Pain Loc --      Pain Edu? --      Excl. in [place]? --     Constitutional: Alert and oriented. Well appearing and in no acute distress. Eyes: Conjunctivae are normal Head: Atraumatic HEENT: No congestion/rhinnorhea. Mucous membranes are moist.  Oropharynx non-erythematous Neck:   Nontender with no meningismus, no masses, no stridor Cardiovascular: Normal rate, regular rhythm. Grossly normal heart sounds.  Good peripheral circulation. Respiratory: Normal respiratory effort.  No retractions. Lungs CTAB. Abdominal: Soft and nontender. No distention. No guarding no rebound Back:  There is no focal tenderness or step off.  there is no midline tenderness there are no lesions noted. there is no CVA tenderness Musculoskeletal: Is post amputation of the right lower extremity, left lower extremity shows erythema and warmth tracking of the anterior portion from the foot which itself is red.  Faint DP pulses appreciated, foot is very warm and well-perfused however compartments are soft no crepitus no induration.  Skin is warm and blanchable.  Upper extremity tenderness. No joint effusions, no DVT signs strong distal pulses no edema Neurologic:  Normal speech and language. No gross focal neurologic deficits are appreciated.  Skin:  Skin is  warm, dry and intact. No rash noted. Psychiatric: Mood and affect are normal. Speech and behavior are normal.  ____________________________________________   LABS (all labs ordered are listed, but only abnormal results are displayed)  Labs Reviewed  GLUCOSE, CAPILLARY - Abnormal; Notable for the following components:      Result Value   Glucose-Capillary 161 (*)    All other components within normal limits  CULTURE, BLOOD (ROUTINE X 2)  BASIC METABOLIC PANEL  CBC WITH DIFFERENTIAL/PLATELET  LACTIC ACID, PLASMA  LACTIC ACID, PLASMA    Pertinent labs  results that were available during my care of the patient were reviewed by me and considered in my medical decision making (see chart for details). ____________________________________________  EKG  I personally interpreted any EKGs ordered by me or triage  ____________________________________________  RADIOLOGY  Pertinent labs & imaging results that were available during my care of the patient were reviewed by me and considered in my medical decision making (see chart for details). If possible, patient and/or family made aware of any abnormal findings.  No results found. ____________________________________________    PROCEDURES  Procedure(s) performed: None  Procedures  Critical Care performed: None  ____________________________________________   INITIAL IMPRESSION / ASSESSMENT AND PLAN / ED COURSE  Pertinent labs & imaging results that were available during my care of the patient were reviewed by me and considered in my medical decision making (see chart for details).  Patient here with cellulitis of the lower extremity despite antibiotics with a history of poorly controlled diabetes and tobacco abuse peripheral vascular disease, no evidence of acute ischemia to the leg, however, it does appear to be likely cellulitic we will start him on broad-spectrum antibiotics and because he has been on them already as an  outpatient I think he will benefit from admission.  Patient in no acute distress, low suspicion for sepsis blood cultures have been sent.  DVT is always a possibility but with redness to the posterior foot tracking up the anterior leg I have low suspicion that that is the causative agent given his history of poor peripheral vascular flow and recurrent cellulitic processes   ----------------------------------------- 8:19 PM on 07/27/2018 ----------------------------------------- Of note, patient has been on prednisone so I do anticipate a leukocytosis    ____________________________________________   FINAL CLINICAL IMPRESSION(S) / ED DIAGNOSES  Final diagnoses:  None      This chart was dictated using voice recognition software.  Despite best efforts to proofread,  errors can occur which can change meaning.      Schuyler Amor, MD 07/27/18 2017    Schuyler Amor, MD 07/27/18 2019

## 2018-07-27 NOTE — H&P (Signed)
[facility] NAME: [name] [name]    MR#:  [id number]  DATE OF BIRTH:  10/27/1960  DATE OF ADMISSION:  07/27/2018  PRIMARY CARE PHYSICIAN: Clarisse Gouge, MD   REQUESTING/REFERRING PHYSICIAN: McShane  CHIEF COMPLAINT:   Chief Complaint  Patient presents with  . Cellulitis  . Leg Swelling    HISTORY OF PRESENT ILLNESS: [name] [name]  is a 58 y.o. 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 He had some cough and sputum with chills for last 1 week so went to primary care physician and was given oral Levaquin prescription.  He has been getting oral Levaquin for 5 to 6 days now.  For last 1 week he also have left leg redness and pain which is still persistent in spite of taking Levaquin.  He went to his primary care physician again and advised to go to emergency room for IV antibiotics.   PAST MEDICAL HISTORY:   Past Medical History:  Diagnosis Date  . Arthritis   . Asthma   . Atherosclerosis   . BPH (benign prostatic hyperplasia)   . Carotid arterial disease ([place])   . Charcot's joint of foot, right   . Chronic combined systolic (congestive) and diastolic (congestive) heart failure ([place])    a. 02/2013 EF 50% by LV gram; b. 04/2017 Echo: EF 25-30%. diff HK. Gr2 DD. Mod dil LA/RV. PASP 17mmHg.  [name] COPD (chronic obstructive pulmonary disease) ([place])   . Coronary artery disease    a. 2013 S/P PCI of [facility]);  b. 03/2013 PCI: RCA 90p ( 2.5 x 23 mm DES); c. 02/2014 Cath: patent RCA stent->Med Rx; d. 04/2017 Cath: LM nl, LAd 30ost/p, 39m/d, D1 80ost, LCX 90p/m, OM1 85, RCA 70/20p, 50m, 70d->Referred for CT Surg-felt to be poor  candidate.  . Diabetic neuropathy ([place])   . Diabetic ulcer of right foot ([place])   . Hernia   . Hyperlipidemia   . Hypertension   . Ischemic cardiomyopathy    a. 04/2017 Echo: EF 25-30%; b. 08/2017 Cardiac MRI ([facility]): EF 19%, sev glob HK. RVEF 20%, mod BAE, triv MR, mild-mod TR. Basal lateral subendocardial infarct (viable), inf/infsept, dist septal ischemia, basal to mid lat peri-infarct ischemia.  . Morbid obesity ([place])   . Neuropathy   . PAD (peripheral artery disease) ([place])    a. Followed by Dr. Lucky Cowboy; b. 08/2010 Periph Angio: RSFA 70-80p (6X60 self-expanding stent); c. 09/2010 Periph Angio: L SFA 100p (7x unknown length self-expanding stent); d. 06/2015 Periph Angio: R SFA short segment occlusion (6x12 self-expanding stent).  . Restless leg syndrome   . Sleep apnea   . Subclavian artery stenosis, right ([place])   . Syncope and collapse   . Tobacco use    a. 75+ yr hx - still smoking 1ppd, down from 2 ppd.  . Type II diabetes mellitus ([place])   . Varicose vein     PAST SURGICAL HISTORY:  Past Surgical History:  Procedure Laterality Date  . ABDOMINAL AORTAGRAM N/A 06/23/2013   Procedure: ABDOMINAL Maxcine Ham;  Surgeon: Wellington Hampshire, MD;  Location: [facility] CATH LAB;  Service: Cardiovascular;  Laterality: N/A;  . AMPUTATION Right 04/01/2018   Procedure: AMPUTATION BELOW KNEE;  Surgeon: Leotis Pain  S, MD;  Location: [facility] ORS;  Service: Vascular;  Laterality: Right;  . APPENDECTOMY    . CARDIAC CATHETERIZATION  10/14   [facility]; X1 STENT PROXIMAL RCA  . CARDIAC CATHETERIZATION  02/2010   [facility]  . CARDIAC CATHETERIZATION  02/21/2014   [facility]  . CLAVICLE SURGERY    . HERNIA REPAIR    . IRRIGATION AND DEBRIDEMENT FOOT Right 01/04/2018   Procedure: IRRIGATION AND DEBRIDEMENT FOOT;  Surgeon: Samara Deist, DPM;  Location: [facility] ORS;  Service: Podiatry;  Laterality: Right;  . LEFT HEART CATH AND CORS/GRAFTS ANGIOGRAPHY N/A 04/28/2017   Procedure: LEFT HEART CATH AND CORONARY ANGIOGRAPHY;  Surgeon: Wellington Hampshire, MD;  Location: [facility] CV LAB;  Service: Cardiovascular;  Laterality: N/A;  . LOWER EXTREMITY ANGIOGRAPHY Right 03/03/2018   Procedure: LOWER EXTREMITY ANGIOGRAPHY;  Surgeon: Katha Cabal, MD;  Location: [facility];  Service: Cardiovascular;  Laterality: Right;  . PERIPHERAL ARTERIAL STENT GRAFT     x2 left/right  . PERIPHERAL VASCULAR BALLOON ANGIOPLASTY Right 01/06/2018   Procedure: PERIPHERAL VASCULAR BALLOON ANGIOPLASTY;  Surgeon: Katha Cabal, MD;  Location: [facility] CV LAB;  Service: Cardiovascular;  Laterality: Right;  . PERIPHERAL VASCULAR CATHETERIZATION N/A 07/06/2015   Procedure: Abdominal Aortogram w/Lower Extremity;  Surgeon: Algernon Huxley, MD;  Location: [facility] CV [facility];  Service: Cardiovascular;  Laterality: N/A;  . PERIPHERAL VASCULAR CATHETERIZATION  07/06/2015   Procedure: Lower Extremity Intervention;  Surgeon: Algernon Huxley, MD;  Location: [facility];  Service: Cardiovascular;;    SOCIAL HISTORY:  Social History   Tobacco Use  . Smoking status: Current Every Day Smoker    Packs/day: 1.00    Years: 41.00    Pack years: 41.00    Types: Cigarettes  . Smokeless tobacco: Never Used  Substance Use Topics  . Alcohol use: No    FAMILY HISTORY:  Family History  Problem Relation Age of Onset  . Heart attack Father   . Heart disease Father   . Hypertension Father   . Hyperlipidemia Father   . Hypertension Mother   . Hyperlipidemia Mother     DRUG ALLERGIES:  Allergies  Allergen Reactions  . Dulaglutide Anaphylaxis, Diarrhea and Hives  . Prednisone Rash    REVIEW OF SYSTEMS:   CONSTITUTIONAL: No fever, fatigue or weakness.  EYES: No blurred or double vision.  EARS, NOSE, AND THROAT: No tinnitus or ear pain.  RESPIRATORY: No cough, shortness of breath, wheezing or hemoptysis.  CARDIOVASCULAR: No chest pain, orthopnea, edema.  GASTROINTESTINAL: No nausea, vomiting, diarrhea or abdominal pain.   GENITOURINARY: No dysuria, hematuria.  ENDOCRINE: No polyuria, nocturia,  HEMATOLOGY: No anemia, easy bruising or bleeding SKIN: Left leg redness and pain. MUSCULOSKELETAL: No joint pain or arthritis.   NEUROLOGIC: No tingling, numbness, weakness.  PSYCHIATRY: No anxiety or depression.   MEDICATIONS AT HOME:  Prior to Admission medications   Medication Sig Start Date End Date Taking? Authorizing Provider  albuterol (PROVENTIL HFA;VENTOLIN HFA) 108 (90 BASE) MCG/ACT inhaler Inhale 2 puffs into the lungs every 6 (six) hours as needed for wheezing or shortness of breath. 04/15/13   Wellington Hampshire, MD  aspirin EC 81 MG tablet Take 1 tablet (81 mg total) by mouth daily. 05/11/15   Wellington Hampshire, MD  atorvastatin (LIPITOR) 80 MG tablet Take 80 mg by mouth daily.    [provider]  calcium carbonate (OS-CAL) 1250 (500 Ca) MG chewable tablet Chew by mouth.  [provider]  calcium carbonate (TUMS - DOSED IN MG ELEMENTAL CALCIUM) 500 MG chewable tablet Chew 4 tablets by mouth daily as needed for indigestion or heartburn.    [provider]  clopidogrel (PLAVIX) 75 MG tablet Take 75 mg by mouth daily.  08/22/17   [provider]  diphenhydrAMINE (BENADRYL) 25 MG tablet Take 25 mg by mouth daily as needed for allergies.    [provider]  doxycycline (VIBRAMYCIN) 100 MG capsule Take 1 capsule (100 mg total) by mouth 2 (two) times daily. 05/05/18   Kris Hartmann, NP  ezetimibe (ZETIA) 10 MG tablet Take 10 mg by mouth daily.    [provider]  fluticasone furoate-vilanterol (BREO ELLIPTA) 100-25 MCG/INH AEPB Inhale into the lungs. 04/14/18   [provider]  furosemide (LASIX) 40 MG tablet Take 40 mg by mouth daily. May take twice daily only if needed 01/15/18   [provider]  gabapentin (NEURONTIN) 800 MG tablet Take 800 mg by mouth 4 (four) times daily.  08/14/17 08/14/18  [provider]  Insulin Glargine (LANTUS  SOLOSTAR) 100 UNIT/ML Solostar Pen Inject 30 Units into the skin at bedtime. 04/07/18   Mayo, Pete Pelt, MD  insulin lispro (HUMALOG) 100 UNIT/ML injection Sliding Scale Insulin before meals using Novolog If BS is 0 to 70 no insulin and eat/glucose tablets/glucose get If BS 71 to 150 great! If BS 151 to 200, 2 Units Novolog If BS 201 to 250, 4 Units Novolog If BS 251 to 300, 6 Units Novolog If BS 301 to 350, 8 Units Novolog If BS 351 to 400, 10 Units Novolog If BS greater than 400, take 10 Units and Contact our office during office hour 04/14/18   [provider]  ipratropium-albuterol (DUONEB) 0.5-2.5 (3) MG/3ML SOLN Inhale 3 mLs into the lungs every 6 (six) hours as needed (wheezing/sob).     [provider]  magnesium oxide (MAG-OX) 400 MG tablet Take by mouth. 04/16/18 04/16/19  [provider]  metFORMIN (GLUCOPHAGE-XR) 500 MG 24 hr tablet Take 500 mg by mouth 2 (two) times daily.     [provider]  metoprolol succinate (TOPROL-XL) 25 MG 24 hr tablet Take by mouth. 03/19/18 03/19/19  [provider]  Multiple Vitamin (MULTI-VITAMINS) TABS Take by mouth.    [provider]  multivitamin-lutein (OCUVITE-LUTEIN) CAPS capsule Take 1 capsule by mouth daily. 01/09/18   Demetrios Loll, MD  nitroGLYCERIN (NITRODUR - DOSED IN MG/24 HR) 0.4 mg/hr patch Place 0.4 mg onto the skin daily.    [provider]  nitroGLYCERIN (NITROSTAT) 0.4 MG SL tablet Place 0.4 mg under the tongue every 5 (five) minutes as needed for chest pain.    [provider]  Oxycodone HCl 10 MG TABS Take 10 mg by mouth every 6 (six) hours as needed (pain).  01/15/18   [provider]  potassium chloride (K-DUR) 10 MEQ tablet Take by mouth. 02/24/18 02/24/19  [provider]  promethazine (PHENERGAN) 25 MG tablet TAKE 1 TABLET BY MOUTH EVERY 6 HOURS AS NEEDED NAUSEA 04/29/18   [provider]  ranitidine (ZANTAC) 150 MG tablet Take 300 mg by mouth daily  as needed for heartburn.    [provider]  spironolactone (ALDACTONE) 25 MG tablet Take by mouth. 03/02/18   [provider]  traZODone (DESYREL) 50 MG tablet Take 50 mg by mouth at bedtime as needed for sleep.  08/12/17   [provider]  PHYSICAL EXAMINATION:   VITAL SIGNS: Blood pressure 118/75, pulse (!) 116, temperature 97.8 F (36.6 C), temperature source Oral, resp. rate 18, height 5\' 7"  (1.702 m), weight 106.1 kg, SpO2 90 %.  GENERAL:  58 y.o.-year-old patient lying in the bed with no acute distress.  EYES: Pupils equal, round, reactive to light and accommodation. No scleral icterus. Extraocular muscles intact.  HEENT: Head atraumatic, normocephalic. Oropharynx and nasopharynx clear.  NECK:  Supple, no jugular venous distention. No thyroid enlargement, no tenderness.  LUNGS: Normal breath sounds bilaterally, no wheezing, rales,rhonchi or crepitation. No use of accessory muscles of respiration.  CARDIOVASCULAR: S1, S2 normal. No murmurs, rubs, or gallops.  ABDOMEN: Soft, nontender, nondistended. Bowel sounds present. No organomegaly or mass.  EXTREMITIES: No pedal edema, cyanosis, or clubbing.  Right side below-knee amputation. NEUROLOGIC: Cranial nerves II through XII are intact. Muscle strength 5/5 in all extremities. Sensation intact. Gait not checked.  PSYCHIATRIC: The patient is alert and oriented x 3.  SKIN: Left leg redness and pain.   LABORATORY PANEL:   CBC Recent Labs  Lab 07/27/18 2004  WBC 14.4*  HGB 14.3  HCT 44.6  PLT 335  MCV 76.0*  MCH 24.4*  MCHC 32.1  RDW 18.5*  LYMPHSABS 2.9  MONOABS 0.8  EOSABS 0.3  BASOSABS 0.1   ------------------------------------------------------------------------------------------------------------------  Chemistries  Recent Labs  Lab 07/27/18 2004  NA 134*  K 3.5  CL 95*  CO2 32  GLUCOSE 165*  BUN 17  CREATININE 0.97  CALCIUM 8.7*    ------------------------------------------------------------------------------------------------------------------ estimated creatinine clearance is 99.9 mL/min (by C-G formula based on SCr of 0.97 mg/dL). ------------------------------------------------------------------------------------------------------------------ No results for input(s): TSH, T4TOTAL, T3FREE, THYROIDAB in the last 72 hours.  Invalid input(s): FREET3   Coagulation profile No results for input(s): INR, PROTIME in the last 168 hours. ------------------------------------------------------------------------------------------------------------------- No results for input(s): DDIMER in the last 72 hours. -------------------------------------------------------------------------------------------------------------------  Cardiac Enzymes No results for input(s): CKMB, TROPONINI, MYOGLOBIN in the last 168 hours.  Invalid input(s): CK ------------------------------------------------------------------------------------------------------------------ Invalid input(s): POCBNP  ---------------------------------------------------------------------------------------------------------------  Urinalysis    Component Value Date/Time   COLORURINE YELLOW (A) 03/31/2018 0245   APPEARANCEUR CLEAR (A) 03/31/2018 0245   APPEARANCEUR Clear 02/06/2014 1412   LABSPEC 1.010 03/31/2018 0245   LABSPEC 1.015 02/06/2014 1412   PHURINE 6.0 03/31/2018 0245   GLUCOSEU NEGATIVE 03/31/2018 0245   GLUCOSEU Negative 02/06/2014 1412   HGBUR SMALL (A) 03/31/2018 0245   BILIRUBINUR NEGATIVE 03/31/2018 0245   BILIRUBINUR Negative 02/06/2014 1412   KETONESUR NEGATIVE 03/31/2018 0245   PROTEINUR 30 (A) 03/31/2018 0245   NITRITE NEGATIVE 03/31/2018 0245   LEUKOCYTESUR NEGATIVE 03/31/2018 0245   LEUKOCYTESUR Negative 02/06/2014 1412     RADIOLOGY: No results found.  EKG: Orders placed or performed during the hospital encounter of 03/30/18   . ED EKG 12-Lead  . ED EKG 12-Lead  . EKG 12-Lead  . EKG 12-Lead  . EKG    IMPRESSION AND PLAN:  *Cellulitis, failure of outpatient oral treatment Will give IV Unasyn for now. He had outpatient appointment set up with vascular tomorrow. Currently there are no changes of gangrene. I have advised him to reschedule his outpatient appointment within the next 1 to 2 weeks.  There does not seem to be need for getting vascular consult as inpatient.  *Diabetes with peripheral neuropathy and complications of infected foot resulting in amputation Continue home medications and keep on sliding scale coverage.   *Chronic systolic congestive heart failure, ischemic cardiomyopathy Continue home medications.  Currently  no exacerbation symptoms.  *Hypertension Continue home medications, stable  *Status post right-sided below-knee amputation due to diabetic infection I advised to follow with vascular surgery as outpatient for his left side.  *Active smoking Counseled to quit smoking for 4 minutes and offered nicotine patch.   All the records are reviewed and case discussed with ED provider. Management plans discussed with the patient, family and they are in agreement.  CODE STATUS: Full code Code Status History    Date Active Date Inactive Code Status Order ID Comments User Context   03/30/2018 1839 04/07/2018 2116 Full Code [id number]  Nicholes Mango, MD Inpatient   03/03/2018 1044 03/03/2018 1545 Full Code [id number]  Delana Meyer Dolores Lory, MD Inpatient   01/03/2018 0409 01/09/2018 1723 Full Code [id number]  Lance Coon, MD Inpatient   12/22/2017 0607 12/23/2017 1916 Full Code [id number]  Harrie Foreman, MD Inpatient   11/21/2017 0415 11/23/2017 1444 Full Code [id number]  Lance Coon, MD Inpatient   04/23/2017 0355 04/29/2017 1353 Full Code [id number]  Saundra Shelling, MD Inpatient   07/11/2016 0324 07/11/2016 1851 Full Code [id number]  Saundra Shelling, MD Inpatient   05/06/2016 0005 05/07/2016 1417 Full  Code [id number]  Harvie Bridge, DO Inpatient   11/21/2015 1716 11/22/2015 1950 Full Code [id number]  Gladstone Lighter, MD Inpatient   07/06/2015 0948 07/06/2015 1437 Full Code [id number]  Algernon Huxley, MD Inpatient       TOTAL TIME TAKING CARE OF THIS PATIENT: 50 minutes.    Vaughan Basta M.D on 07/27/2018   Between 7am to 6pm - Pager - [phone]  After 6pm go to [email or web address] - password [email or web address] [facility]  Office  [phone]  CC: Primary care physician; Clarisse Gouge, MD   Note: This dictation was prepared with Dragon dictation along with smaller phrase technology. Any transcriptional errors that result from this process are unintentional.

## 2018-07-27 NOTE — ED Notes (Signed)
Report was called.

## 2018-07-27 NOTE — ED Triage Notes (Signed)
Pt arrived to the ED for complaints of left leg swelling, redness and feels warm to the touch. Pt reports that he went to his primary health care provider and she told him to come to the ED since she believed that he could be septic secondary to leg infection. Pt reports that he has been septic in that past and that resulted in the amputation of his right leg. Pt is AOx4 in no apparent distress.

## 2018-07-27 NOTE — ED Notes (Signed)
Attempted to call report. Stated that I would call back in 10 mins.

## 2018-07-28 DIAGNOSIS — L03116 Cellulitis of left lower limb: Secondary | ICD-10-CM | POA: Diagnosis not present

## 2018-07-28 DIAGNOSIS — M7989 Other specified soft tissue disorders: Secondary | ICD-10-CM | POA: Diagnosis not present

## 2018-07-28 LAB — CBC
HCT: 43.1 % (ref 39.0–52.0)
Hemoglobin: 13.5 g/dL (ref 13.0–17.0)
MCH: 23.9 pg — ABNORMAL LOW (ref 26.0–34.0)
MCHC: 31.3 g/dL (ref 30.0–36.0)
MCV: 76.3 fL — ABNORMAL LOW (ref 80.0–100.0)
Platelets: 318 10*3/uL (ref 150–400)
RBC: 5.65 MIL/uL (ref 4.22–5.81)
RDW: 18.6 % — ABNORMAL HIGH (ref 11.5–15.5)
WBC: 12.3 10*3/uL — ABNORMAL HIGH (ref 4.0–10.5)
nRBC: 0 % (ref 0.0–0.2)

## 2018-07-28 LAB — BASIC METABOLIC PANEL
Anion gap: 3 — ABNORMAL LOW (ref 5–15)
BUN: 18 mg/dL (ref 6–20)
CO2: 33 mmol/L — ABNORMAL HIGH (ref 22–32)
Calcium: 8.6 mg/dL — ABNORMAL LOW (ref 8.9–10.3)
Chloride: 101 mmol/L (ref 98–111)
Creatinine, Ser: 1.06 mg/dL (ref 0.61–1.24)
GFR calc Af Amer: >60 mL/min (ref >60)
GFR calc non Af Amer: >60 mL/min (ref >60)
Glucose, Bld: 122 mg/dL — ABNORMAL HIGH (ref 70–99)
Potassium: 4 mmol/L (ref 3.5–5.1)
Sodium: 137 mmol/L (ref 135–145)

## 2018-07-28 LAB — GLUCOSE, CAPILLARY
Glucose-Capillary: 116 mg/dL — ABNORMAL HIGH (ref 70–99)
Glucose-Capillary: 143 mg/dL — ABNORMAL HIGH (ref 70–99)

## 2018-07-28 MED ORDER — AMOXICILLIN-POT CLAVULANATE 875-125 MG PO TABS
1.0000 | ORAL_TABLET | Freq: Two times a day (BID) | ORAL | Status: DC
  Administered 2018-07-28: 1 via ORAL
  Filled 2018-07-28: qty 1

## 2018-07-28 MED ORDER — TRAMADOL HCL 50 MG PO TABS
50.0000 mg | ORAL_TABLET | Freq: Four times a day (QID) | ORAL | Status: DC | PRN
  Administered 2018-07-28: 50 mg via ORAL
  Filled 2018-07-28: qty 1

## 2018-07-28 MED ORDER — AMOXICILLIN-POT CLAVULANATE 875-125 MG PO TABS: 1.0000 | ORAL_TABLET | Freq: Two times a day (BID) | ORAL | 0 refills | Status: DC

## 2018-07-28 MED ORDER — OXYCODONE HCL 5 MG PO TABS
10.0000 mg | ORAL_TABLET | Freq: Four times a day (QID) | ORAL | Status: DC | PRN
  Filled 2018-07-28: qty 2

## 2018-07-28 MED ORDER — ACETAMINOPHEN 325 MG PO TABS: 650.0000 mg | ORAL_TABLET | Freq: Four times a day (QID) | ORAL | Status: DC | PRN

## 2018-07-28 NOTE — Plan of Care (Signed)
  Problem: Clinical Measurements: Goal: Ability to avoid or minimize complications of infection will improve Outcome: Progressing   Problem: Skin Integrity: Goal: Skin integrity will improve Outcome: Progressing   Problem: Education: Goal: Knowledge of General Education information will improve Description Including pain rating scale, medication(s)/side effects and non-pharmacologic comfort measures Outcome: Progressing   Problem: Pain Managment: Goal: General experience of comfort will improve Outcome: Progressing   Problem: Safety: Goal: Ability to remain free from injury will improve Outcome: Progressing

## 2018-07-28 NOTE — Discharge Summary (Signed)
[facility] NAME: Bearett Porcaro    MR#:  [id number]  DATE OF BIRTH:  Mar 13, 1961  DATE OF ADMISSION:  07/27/2018 ADMITTING PHYSICIAN: Vaughan Basta, MD  DATE OF DISCHARGE: 07/28/2018  PRIMARY CARE PHYSICIAN: Clarisse Gouge, MD    ADMISSION DIAGNOSIS:  Cellulitis of left lower extremity [Y63.785]  DISCHARGE DIAGNOSIS:  left tibial shin cellulitis improved  SECONDARY DIAGNOSIS:   Past Medical History:  Diagnosis Date  . Arthritis   . Asthma   . Atherosclerosis   . BPH (benign prostatic hyperplasia)   . Carotid arterial disease ([place])   . Charcot's joint of foot, right   . Chronic combined systolic (congestive) and diastolic (congestive) heart failure ([place])    a. 02/2013 EF 50% by LV gram; b. 04/2017 Echo: EF 25-30%. diff HK. Gr2 DD. Mod dil LA/RV. PASP 61mmHg.  [name] COPD (chronic obstructive pulmonary disease) ([place])   . Coronary artery disease    a. 2013 S/P PCI of LAD [facility]);  b. 03/2013 PCI: RCA 90p ( 2.5 x 23 mm DES); c. 02/2014 Cath: patent RCA stent->Med Rx; d. 04/2017 Cath: LM nl, LAd 30ost/p, 42m/d, D1 80ost, LCX 90p/m, OM1 85, RCA 70/20p, 33m, 70d->Referred for CT Surg-felt to be poor candidate.  . Diabetic neuropathy ([place])   . Diabetic ulcer of right foot ([place])   . Hernia   . Hyperlipidemia   . Hypertension   . Ischemic cardiomyopathy    a. 04/2017 Echo: EF 25-30%; b. 08/2017 Cardiac MRI ([facility]): EF 19%, sev glob HK. RVEF 20%, mod BAE, triv MR, mild-mod TR. Basal lateral subendocardial infarct (viable), inf/infsept, dist septal ischemia, basal to mid lat peri-infarct ischemia.  . Morbid obesity ([place])   . Neuropathy   . PAD (peripheral artery disease) ([place])    a. Followed by Dr. Lucky Cowboy; b. 08/2010 Periph Angio: RSFA 70-80p (6X60 self-expanding stent); c. 09/2010 Periph Angio: L SFA 100p (7x unknown length self-expanding stent); d. 06/2015 Periph Angio: R SFA short segment occlusion (6x12 self-expanding stent).  .  Restless leg syndrome   . Sleep apnea   . Subclavian artery stenosis, right ([place])   . Syncope and collapse   . Tobacco use    a. 75+ yr hx - still smoking 1ppd, down from 2 ppd.  . Type II diabetes mellitus ([place])   . Varicose vein     HOSPITAL COURSE:  Jaxtin Raimondo  is a 58 y.o. 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 Patient presented with left tibial shin cellulitis.  *Cellulitis, failure of outpatient oral treatment Will give IV Unasyn for now--change to oral augmentin. Looks much better -WBC normal -f/u  up with vascular on your appt   *Diabetes with peripheral neuropathy and complications of infected foot resulting in amputation Continue home medications and keep on sliding scale coverage.   *Chronic systolic congestive heart failure, ischemic cardiomyopathy Continue home medications.  Currently no exacerbation symptoms.  *Hypertension Continue home medications, stable  *Active smoking Counseled to quit smoking for 4 minutes and offered nicotine patch.  Clinically patient is improving. He feels better. No fever. White count normal. Will discharge on oral antibiotic with outpatient follow-up with primary care physician and vascular surgery on his appointment given history  of peripheral arterial disease.  CONSULTS OBTAINED:    DRUG ALLERGIES:   Allergies  Allergen Reactions  . Dulaglutide Anaphylaxis, Diarrhea and Hives  . Prednisone Rash    DISCHARGE MEDICATIONS:   Allergies as of 07/28/2018      Reactions   Dulaglutide Anaphylaxis, Diarrhea, Hives   Prednisone Rash      Medication List    TAKE these medications    amoxicillin-clavulanate 875-125 MG tablet Commonly known as:  AUGMENTIN Take 1 tablet by mouth every 12 (twelve) hours.   aspirin EC 81 MG tablet Take 1 tablet (81 mg total) by mouth daily.   atorvastatin 80 MG tablet Commonly known as:  LIPITOR Take 80 mg by mouth daily.   calcium carbonate 1250 (500 Ca) MG chewable tablet Commonly known as:  OS-CAL Chew by mouth.   calcium carbonate 500 MG chewable tablet Commonly known as:  TUMS - dosed in mg elemental calcium Chew 4 tablets by mouth daily as needed for indigestion or heartburn.   clopidogrel 75 MG tablet Commonly known as:  PLAVIX Take 75 mg by mouth daily.   diphenhydrAMINE 25 MG tablet Commonly known as:  BENADRYL Take 25 mg by mouth daily as needed for allergies.   ezetimibe 10 MG tablet Commonly known as:  ZETIA Take 10 mg by mouth daily.   fluticasone furoate-vilanterol 100-25 MCG/INH Aepb Commonly known as:  BREO ELLIPTA Inhale 1 puff into the lungs daily.   furosemide 40 MG tablet Commonly known as:  LASIX Take 40 mg by mouth daily. May take twice daily only if needed   gabapentin 800 MG tablet Commonly known as:  NEURONTIN Take 800 mg by mouth 4 (four) times daily.   Insulin Glargine 100 UNIT/ML Solostar Pen Commonly known as:  LANTUS SOLOSTAR Inject 30 Units into the skin at bedtime.   insulin lispro 100 UNIT/ML injection Commonly known as:  HUMALOG Sliding Scale Insulin before meals using Novolog If BS is 0 to 70 no insulin and eat/glucose tablets/glucose get If BS 71 to 150 great! If BS 151 to 200, 2 Units Novolog If BS 201 to 250, 4 Units Novolog If BS 251 to 300, 6 Units Novolog If BS 301 to 350, 8 Units Novolog If BS 351 to 400, 10 Units Novolog If BS greater than 400, take 10 Units and Contact our office during office hour   ipratropium-albuterol 0.5-2.5 (3) MG/3ML Soln Commonly known as:  DUONEB Inhale 3 mLs into the lungs every 6 (six) hours as needed (wheezing/sob).   magnesium oxide 400  MG tablet Commonly known as:  MAG-OX Take 400 mg by mouth daily.   metFORMIN 500 MG 24 hr tablet Commonly known as:  GLUCOPHAGE-XR Take 500 mg by mouth 2 (two) times daily.   metoprolol succinate 25 MG 24 hr tablet Commonly known as:  TOPROL-XL Take 25 mg by mouth daily.   MULTI-VITAMINS Tabs Take 1 tablet by mouth daily.   multivitamin-lutein Caps capsule Take 1 capsule by mouth daily.   nitroGLYCERIN 0.4 MG SL tablet Commonly known as:  NITROSTAT Place 0.4 mg under the tongue every 5 (five) minutes as needed for chest pain. What changed:  Another medication with the same name was removed. Continue taking this medication, and follow the directions you see here.   Oxycodone HCl 10 MG Tabs Take 10 mg by mouth every 6 (six) hours as needed (pain).   potassium chloride 10 MEQ tablet Commonly known as:  K-DUR Take 10 mEq by mouth daily.  promethazine 25 MG tablet Commonly known as:  PHENERGAN TAKE 1 TABLET BY MOUTH EVERY 6 HOURS AS NEEDED NAUSEA   ranitidine 150 MG tablet Commonly known as:  ZANTAC Take 300 mg by mouth daily as needed for heartburn.   spironolactone 25 MG tablet Commonly known as:  ALDACTONE Take 25 mg by mouth daily.   traZODone 50 MG tablet Commonly known as:  DESYREL Take 50 mg by mouth at bedtime as needed for sleep.       If you experience worsening of your admission symptoms, develop shortness of breath, life threatening emergency, suicidal or homicidal thoughts you must seek medical attention immediately by calling 911 or calling your MD immediately  if symptoms less severe.  You Must read complete instructions/literature along with all the possible adverse reactions/side effects for all the Medicines you take and that have been prescribed to you. Take any new Medicines after you have completely understood and accept all the possible adverse reactions/side effects.   Please note  You were cared for by a hospitalist during your hospital stay.  If you have any questions about your discharge medications or the care you received while you were in the hospital after you are discharged, you can call the unit and asked to speak with the hospitalist on call if the hospitalist that took care of you is not available. Once you are discharged, your primary care physician will handle any further medical issues. Please note that NO REFILLS for any discharge medications will be authorized once you are discharged, as it is imperative that you return to your primary care physician (or establish a relationship with a primary care physician if you do not have one) for your aftercare needs so that they can reassess your need for medications and monitor your lab values. Today   SUBJECTIVE   Doing well.  VITAL SIGNS:  Blood pressure 113/80, pulse (!) 110, temperature 98.5 F (36.9 C), temperature source Oral, resp. rate 18, height 5\' 7"  (1.702 m), weight 97.8 kg, SpO2 94 %.  I/O:    Intake/Output Summary (Last 24 hours) at 07/28/2018 1420 Last data filed at 07/28/2018 1152 Gross per 24 hour  Intake 340 ml  Output 1450 ml  Net -1110 ml    PHYSICAL EXAMINATION:  GENERAL:  58 y.o.-year-old patient lying in the bed with no acute distress.  EYES: Pupils equal, round, reactive to light and accommodation. No scleral icterus. Extraocular muscles intact.  HEENT: Head atraumatic, normocephalic. Oropharynx and nasopharynx clear.  NECK:  Supple, no jugular venous distention. No thyroid enlargement, no tenderness.  LUNGS: Normal breath sounds bilaterally, no wheezing, rales,rhonchi or crepitation. No use of accessory muscles of respiration.  CARDIOVASCULAR: S1, S2 normal. No murmurs, rubs, or gallops.  ABDOMEN: Soft, non-tender, non-distended. Bowel sounds present. No organomegaly or mass.  EXTREMITIES: No pedal edema, cyanosis, or clubbing. Left tibial shin cellulitis marked with marking pan improving. Skin normal temperature. Redness improved. No draining  ulcer or skin breakdown. NEUROLOGIC: Cranial nerves II through XII are intact. Muscle strength 5/5 in all extremities. Sensation intact. Gait not checked.  PSYCHIATRIC: The patient is alert and oriented x 3.  SKIN: No obvious rash, lesion, or ulcer.   DATA REVIEW:   CBC  Recent Labs  Lab 07/28/18 0347  WBC 12.3*  HGB 13.5  HCT 43.1  PLT 318    Chemistries  Recent Labs  Lab 07/28/18 0347  NA 137  K 4.0  CL 101  CO2 33*  GLUCOSE 122*  BUN 18  CREATININE 1.06  CALCIUM 8.6*    Microbiology Results   Recent Results (from the past 240 hour(s))  Culture, blood (routine x 2)     Status: None (Preliminary result)   Collection Time: 07/27/18  8:05 PM  Result Value Ref Range Status   Specimen Description BLOOD RIGHT ANTECUBITAL  Final   Special Requests   Final    BOTTLES DRAWN AEROBIC AND ANAEROBIC Blood Culture results may not be optimal due to an excessive volume of blood received in culture bottles   Culture   Final    NO GROWTH < 12 HOURS Performed at [facility], [address] [place], [place] [address]    Report Status PENDING  Incomplete    RADIOLOGY:  No results found.   CODE STATUS:     Code Status Orders  (From admission, onward)         Start     Ordered   07/27/18 2249  Full code  Continuous     07/27/18 2248        Code Status History    Date Active Date Inactive Code Status Order ID Comments User Context   03/30/2018 1839 04/07/2018 2116 Full Code [id number]  Nicholes Mango, MD Inpatient   03/03/2018 1044 03/03/2018 1545 Full Code [id number]  Delana Meyer Dolores Lory, MD Inpatient   01/03/2018 0409 01/09/2018 1723 Full Code [id number]  Lance Coon, MD Inpatient   12/22/2017 0607 12/23/2017 1916 Full Code [id number]  Harrie Foreman, MD Inpatient   11/21/2017 0415 11/23/2017 1444 Full Code [id number]  Lance Coon, MD Inpatient   04/23/2017 0355 04/29/2017 1353 Full Code [id number]  Saundra Shelling, MD Inpatient   07/11/2016 0324 07/11/2016 1851 Full  Code [id number]  Saundra Shelling, MD Inpatient   05/06/2016 0005 05/07/2016 1417 Full Code [id number]  Harvie Bridge, DO Inpatient   11/21/2015 1716 11/22/2015 1950 Full Code [id number]  Gladstone Lighter, MD Inpatient   07/06/2015 0948 07/06/2015 1437 Full Code [id number]  Algernon Huxley, MD Inpatient      TOTAL TIME TAKING CARE OF THIS PATIENT: *40* minutes.    Fritzi Mandes M.D on 07/28/2018 at 2:20 PM  Between 7am to 6pm - Pager - [phone] After 6pm go to [email or web address] - password [email or web address] [facility]  Office  [phone]  CC: Primary care physician; Clarisse Gouge, MD

## 2018-07-29 LAB — HIV ANTIBODY (ROUTINE TESTING W REFLEX): HIV Screen 4th Generation wRfx: NONREACTIVE

## 2018-08-01 LAB — CULTURE, BLOOD (ROUTINE X 2): Culture: NO GROWTH

## 2018-08-04 ENCOUNTER — Telehealth (INDEPENDENT_AMBULATORY_CARE_PROVIDER_SITE_OTHER): Payer: Self-pay | Admitting: Nurse Practitioner

## 2018-08-04 NOTE — Telephone Encounter (Signed)
[employer] rx has been faxed

## 2018-08-05 ENCOUNTER — Ambulatory Visit (INDEPENDENT_AMBULATORY_CARE_PROVIDER_SITE_OTHER): Payer: Medicare Other | Admitting: Nurse Practitioner

## 2018-08-05 ENCOUNTER — Encounter (INDEPENDENT_AMBULATORY_CARE_PROVIDER_SITE_OTHER): Payer: Medicare Other

## 2018-08-19 ENCOUNTER — Encounter (INDEPENDENT_AMBULATORY_CARE_PROVIDER_SITE_OTHER): Payer: Self-pay

## 2018-08-19 ENCOUNTER — Encounter (INDEPENDENT_AMBULATORY_CARE_PROVIDER_SITE_OTHER): Payer: Self-pay | Admitting: Nurse Practitioner

## 2018-08-19 ENCOUNTER — Ambulatory Visit (INDEPENDENT_AMBULATORY_CARE_PROVIDER_SITE_OTHER): Payer: Medicare Other | Admitting: Nurse Practitioner

## 2018-08-19 ENCOUNTER — Ambulatory Visit (INDEPENDENT_AMBULATORY_CARE_PROVIDER_SITE_OTHER): Payer: Medicare Other

## 2018-08-19 ENCOUNTER — Other Ambulatory Visit: Payer: Self-pay

## 2018-08-19 VITALS — BP 125/82 | HR 108 | Resp 18 | Ht 67.0 in | Wt 217.0 lb

## 2018-08-19 DIAGNOSIS — I890 Lymphedema, not elsewhere classified: Secondary | ICD-10-CM

## 2018-08-19 DIAGNOSIS — I7025 Atherosclerosis of native arteries of other extremities with ulceration: Secondary | ICD-10-CM | POA: Diagnosis not present

## 2018-08-19 DIAGNOSIS — L03116 Cellulitis of left lower limb: Secondary | ICD-10-CM

## 2018-08-19 DIAGNOSIS — I10 Essential (primary) hypertension: Secondary | ICD-10-CM

## 2018-08-19 DIAGNOSIS — Z79899 Other long term (current) drug therapy: Secondary | ICD-10-CM

## 2018-08-19 DIAGNOSIS — I83029 Varicose veins of left lower extremity with ulcer of unspecified site: Secondary | ICD-10-CM | POA: Insufficient documentation

## 2018-08-19 DIAGNOSIS — L97929 Non-pressure chronic ulcer of unspecified part of left lower leg with unspecified severity: Secondary | ICD-10-CM

## 2018-08-19 DIAGNOSIS — F17210 Nicotine dependence, cigarettes, uncomplicated: Secondary | ICD-10-CM

## 2018-08-19 MED ORDER — DOXYCYCLINE HYCLATE 100 MG PO CAPS: 100.0000 mg | ORAL_CAPSULE | Freq: Two times a day (BID) | ORAL | 0 refills | Status: DC

## 2018-08-19 NOTE — Progress Notes (Signed)
SUBJECTIVE:  Patient ID: [name] [name], male    DOB: Jun 18, 1960, 58 y.o.   MRN: [id number] Chief Complaint  Patient presents with   Follow-up    32month abi    HPI  [name] [name] is a 58 y.o. male The patient returns to the office for followup and review of the noninvasive studies. There has been a significant deterioration in the lower extremity symptoms.  The patient notes interval shortening of their claudication distance and development of mild rest pain symptoms. No new ulcers or wounds have occurred since the last visit.  Leg is also significantly swollen with an ulceration to the front of the calf.  It is also very erythematous and showing signs of weeping.  There have been no significant changes to the patient's overall health care.  The patient denies amaurosis fugax or recent TIA symptoms. There are no recent neurological changes noted. The patient denies history of DVT, PE or superficial thrombophlebitis. The patient denies recent episodes of angina or shortness of breath.   ABI's Rt=n/a and Lt=0.73 (previous ABI's Rt=0.97 and Lt=0.87) Duplex US of the lower extremity arterial system shows monophasic waveforms in the left tibial arteries.  Right BKA   Past Medical History:  Diagnosis Date   Arthritis    Asthma    Atherosclerosis    BPH (benign prostatic hyperplasia)    Carotid arterial disease (HCC)    Charcot's joint of foot, right    Chronic combined systolic (congestive) and diastolic (congestive) heart failure ([place])    a. 02/2013 EF 50% by LV gram; b. 04/2017 Echo: EF 25-30%. diff HK. Gr2 DD. Mod dil LA/RV. PASP 74mmHg.   COPD (chronic obstructive pulmonary disease) ([place])    Coronary artery disease    a. 2013 S/P PCI of LAD [facility]);  b. 03/2013 PCI: RCA 90p ( 2.5 x 23 mm DES); c. 02/2014 Cath: patent RCA stent->Med Rx; d. 04/2017 Cath: LM nl, LAd 30ost/p, 74m/d, D1 80ost, LCX 90p/m, OM1 85, RCA 70/20p, 75m, 70d->Referred for CT Surg-felt to be poor  candidate.   Diabetic neuropathy ([place])    Diabetic ulcer of right foot ([place])    Hernia    Hyperlipidemia    Hypertension    Ischemic cardiomyopathy    a. 04/2017 Echo: EF 25-30%; b. 08/2017 Cardiac MRI ([facility]): EF 19%, sev glob HK. RVEF 20%, mod BAE, triv MR, mild-mod TR. Basal lateral subendocardial infarct (viable), inf/infsept, dist septal ischemia, basal to mid lat peri-infarct ischemia.   Morbid obesity ([place])    Neuropathy    PAD (peripheral artery disease) ([place])    a. Followed by Dr. Lucky Cowboy; b. 08/2010 Periph Angio: RSFA 70-80p (6X60 self-expanding stent); c. 09/2010 Periph Angio: L SFA 100p (7x unknown length self-expanding stent); d. 06/2015 Periph Angio: R SFA short segment occlusion (6x12 self-expanding stent).   Restless leg syndrome    Sleep apnea    Subclavian artery stenosis, right (HCC)    Syncope and collapse    Tobacco use    a. 75+ yr hx - still smoking 1ppd, down from 2 ppd.   Type II diabetes mellitus (HCC)    Varicose vein     Past Surgical History:  Procedure Laterality Date   ABDOMINAL AORTAGRAM N/A 06/23/2013   Procedure: ABDOMINAL AORTAGRAM;  Surgeon: Wellington Hampshire, MD;  Location: [place] CATH LAB;  Service: Cardiovascular;  Laterality: N/A;   AMPUTATION Right 04/01/2018   Procedure: AMPUTATION BELOW KNEE;  Surgeon: Algernon Huxley, MD;  Location: [facility] ORS;  Service: Vascular;  Laterality: Right;   APPENDECTOMY     CARDIAC CATHETERIZATION  10/14   [place]; X1 STENT PROXIMAL RCA   CARDIAC CATHETERIZATION  02/2010   [facility]   CARDIAC CATHE[facility]IZATION  02/21/2014   [facility]   CLAVICLE SURGERY     HERNIA REPAIR     IRRIGATION AND DEBRIDEMENT FOOT Right 01/04/2018   Procedure: IRRIGATION AND DEBRIDEMENT FOOT;  Surgeon: Samara Deist, DPM;  Location: [facility] ORS;  Service: Podiatry;  Laterality: Right;   LEFT HEART CATH AND CORS/GRAFTS ANGIOGRAPHY N/A 04/28/2017   Procedure: LEFT HEART CATH AND CORONARY ANGIOGRAPHY;  Surgeon: Wellington Hampshire, MD;  Location:  [place] CV LAB;  Service: Cardiovascular;  Laterality: N/A;   LOWER EXTREMITY ANGIOGRAPHY Right 03/03/2018   Procedure: LOWER EXTREMITY ANGIOGRAPHY;  Surgeon: Katha Cabal, MD;  Location: [place] CV [facility];  Service: Cardiovascular;  Laterality: Right;   PERIPHERAL ARTERIAL STENT GRAFT     x2 left/right   PERIPHERAL VASCULAR BALLOON ANGIOPLASTY Right 01/06/2018   Procedure: PERIPHERAL VASCULAR BALLOON ANGIOPLASTY;  Surgeon: Katha Cabal, MD;  Location: [place] [facility] LAB;  Service: Cardiovascular;  Laterality: Right;   PERIPHERAL VASCULAR CATHETERIZATION N/A 07/06/2015   Procedure: Abdominal Aortogram w/Lower Extremity;  Surgeon: Algernon Huxley, MD;  Location: [place] CV LAB;  Service: Cardiovascular;  Laterality: N/A;   PERIPHERAL VASCULAR CATHETERIZATION  07/06/2015   Procedure: Lower Extremity Intervention;  Surgeon: Algernon Huxley, MD;  Location: [place] CV LAB;  Service: Cardiovascular;;    Social History   Socioeconomic History   Marital status: Single    Spouse name: Not on file   Number of children: 0   Years of education: 12   Highest education level: 12th grade  Occupational History   Occupation: on disability  Scientist, product/process development strain: Somewhat hard   Food insecurity:    Worry: Sometimes true    Inability: Sometimes true   Transportation needs:    Medical: No    Non-medical: No  Tobacco Use   Smoking status: Current Every Day Smoker    Packs/day: 1.00    Years: 41.00    Pack years: 41.00    Types: Cigarettes   Smokeless tobacco: Never Used  Substance and Sexual Activity   Alcohol use: No   Drug use: No   Sexual activity: Never  Lifestyle   Physical activity:    Days per week: 7 days    Minutes per session: 30 min   Stress: Very much  Relationships   Social connections:    Talks on phone: More than three times a week    Gets together: Once a week    Attends religious service: Never    Active  member of club or organization: No    Attends meetings of clubs or organizations: Never    Relationship status: Living with partner   Intimate partner violence:    Fear of current or ex partner: No    Emotionally abused: No    Physically abused: No    Forced sexual activity: No  Other Topics Concern   Not on file  Social History Narrative   Lives at home in [place] with family. Independent at baseline.    Family History  Problem Relation Age of Onset   Heart attack Father    Heart disease Father    Hypertension Father    Hyperlipidemia Father    Hypertension Mother    Hyperlipidemia Mother     Allergies  Allergen Reactions   Dulaglutide Anaphylaxis, Diarrhea and Hives   Prednisone Rash     Review of Systems   Review of Systems: Negative Unless Checked Constitutional: [] Weight loss  [] Fever  [] Chills Cardiac: [] Chest pain   []  Atrial Fibrillation  [] Palpitations   [] Shortness of breath when laying flat   [] Shortness of breath with exertion. [] Shortness of breath at rest Vascular:  [x] Pain in legs with walking   [] Pain in legs with standing [] Pain in legs when laying flat   [x] Claudication    [] Pain in feet when laying flat    [] History of DVT   [] Phlebitis   [x] Swelling in legs   [x] Varicose veins   [] Non-healing ulcers Pulmonary:   [] Uses home oxygen   [] Productive cough   [] Hemoptysis   [] Wheeze  [x] COPD   [] Asthma Neurologic:  [] Dizziness   [] Seizures  [] Blackouts [] History of stroke   [] History of TIA  [] Aphasia   [] Temporary Blindness   [] Weakness or numbness in arm   [] Weakness or numbness in leg Musculoskeletal:   [] Joint swelling   [] Joint pain   [] Low back pain  []  History of Knee Replacement [] Arthritis [] back Surgeries  []  Spinal Stenosis    Hematologic:  [] Easy bruising  [] Easy bleeding   [] Hypercoagulable state   [] Anemic Gastrointestinal:  [] Diarrhea   [] Vomiting  [] Gastroesophageal reflux/heartburn   [] Difficulty swallowing. [] Abdominal  pain Genitourinary:  [] Chronic kidney disease   [] Difficult urination  [] Anuric   [] Blood in urine [] Frequent urination  [] Burning with urination   [] Hematuria Skin:  [] Rashes   [x] Ulcers [] Wounds Psychological:  [] History of anxiety   []  History of major depression  []  Memory Difficulties      OBJECTIVE:   Physical Exam  BP 125/82 (BP Location: Right Arm)    Pulse (!) 108    Resp 18    Ht 5\' 7"  (1.702 m)    Wt 217 lb (98.4 kg)    BMI 33.99 kg/m   Gen: WD/WN, NAD Head: [place]/AT, No temporalis wasting.  Ear/Nose/Throat: Hearing grossly intact, nares w/o erythema or drainage Eyes: PER, EOMI, sclera nonicteric.  Neck: Supple, no masses.  No JVD.  Pulmonary:  Good air movement, no use of accessory muscles.  Cardiac: RRR Vascular:  3+ hard edema left lower extremity Vessel Right Left  Radial Palpable Palpable  Dorsalis Pedis  Not Palpable  Posterior Tibial  Not Palpable   Gastrointestinal: soft, non-distended. No guarding/no peritoneal signs.  Musculoskeletal: M/S 5/5 throughout. R BKA  Neurologic: Pain and light touch intact in extremities.  Symmetrical.  Speech is fluent. Motor exam as listed above. Psychiatric: Judgment intact, Mood & affect appropriate for pt's clinical situation. Dermatologic:  Cellulitis of left lower extremity with venous ulceration on mid shin Lymph : No Cervical lymphadenopathy, no lichenification or skin changes of chronic lymphedema.       ASSESSMENT AND PLAN:  1. Atherosclerosis of native arteries of the extremities with ulceration ([place])  Recommend:  The patient has evidence of severe atherosclerotic changes of both lower extremities associated with ulceration and tissue loss of the foot.  This represents a [name] threatening ischemia and places the patient at the risk for [name] loss.  Patient should undergo angiography of the left lower extremities with the hope for intervention for [name] salvage.  The risks and benefits as well as the alternative therapies  was discussed in detail with the patient.  All questions were answered.  Patient agrees to proceed with angiography.  The patient will follow up with me  in the office after the procedure.    2. Essential hypertension Continue antihypertensive medications as already ordered, these medications have been reviewed and there are no changes at this time.   3. Cellulitis of left lower extremity Patient was previously put on Augmentin in order to treat the cellulitis however today the lead is still very red and angry and slightly weepy.  He has been on Augmentin for a week and a half and there is still no change or improvement.  We will stop the Augmentin today and begin doxycycline.  He will take twice a day for 14 days. - doxycycline (VIBRAMYCIN) 100 MG capsule; Take 1 capsule (100 mg total) by mouth 2 (two) times daily.  Dispense: 28 capsule; Refill: 0  4. Lymphedema I have had a long discussion with the patient regarding swelling and why it  causes symptoms.  Patient will begin wearing graduated compression stockings class 1 (20-30 mmHg) on a daily basis a prescription was given. The patient will  beginning wearing the stockings first thing in the morning and removing them in the evening. The patient is instructed specifically not to sleep in the stockings.   In addition, behavioral modification will be initiated.  This will include frequent elevation, use of over the counter pain medications and exercise such as walking.  I have reviewed systemic causes for chronic edema such as liver, kidney and cardiac etiologies.  The patient denies problems with these organ systems.    Consideration for a lymph pump will also be made based upon the effectiveness of conservative therapy.  This would help to improve the edema control and prevent sequela such as ulcers and infections     5. Venous ulcer of left leg (HCC) No surgery or intervention at this point in time.    I have had a long discussion with  the patient regarding venous insufficiency and why it  causes symptoms, specifically venous ulceration . I have discussed with the patient the chronic skin changes that accompany venous insufficiency and the long term sequela such as infection and recurring  ulceration.  Patient will be placed in [employer] which will be changed weekly drainage permitting.  In addition, behavioral modification including several periods of elevation of the lower extremities during the day will be continued. Achieving a position with the ankles at heart level was stressed to the patient  The patient is instructed to begin routine exercise, especially walking on a daily basis  We will review his lower extremity in 4 weeks.     Current Outpatient Medications on File Prior to Visit  Medication Sig Dispense Refill   aspirin EC 81 MG tablet Take 1 tablet (81 mg total) by mouth daily. 90 tablet 3   atorvastatin (LIPITOR) 80 MG tablet Take 80 mg by mouth daily.     calcium carbonate (OS-CAL) 1250 (500 Ca) MG chewable tablet Chew by mouth.     calcium carbonate (TUMS - DOSED IN MG ELEMENTAL CALCIUM) 500 MG chewable tablet Chew 4 tablets by mouth daily as needed for indigestion or heartburn.     clopidogrel (PLAVIX) 75 MG tablet Take 75 mg by mouth daily.      diphenhydrAMINE (BENADRYL) 25 MG tablet Take 25 mg by mouth daily as needed for allergies.     ezetimibe (ZETIA) 10 MG tablet Take 10 mg by mouth daily.     fluticasone furoate-vilanterol (BREO ELLIPTA) 100-25 MCG/INH AEPB Inhale 1 puff into the lungs daily.      furosemide (  LASIX) 40 MG tablet Take 40 mg by mouth daily. May take twice daily only if needed     Insulin Glargine (LANTUS SOLOSTAR) 100 UNIT/ML Solostar Pen Inject 30 Units into the skin at bedtime. 15 mL 11   insulin lispro (HUMALOG) 100 UNIT/ML injection Sliding Scale Insulin before meals using Novolog If BS is 0 to 70 no insulin and eat/glucose tablets/glucose get If BS 71 to 150 great! If  BS 151 to 200, 2 Units Novolog If BS 201 to 250, 4 Units Novolog If BS 251 to 300, 6 Units Novolog If BS 301 to 350, 8 Units Novolog If BS 351 to 400, 10 Units Novolog If BS greater than 400, take 10 Units and Contact our office during office hour     ipratropium-albuterol (DUONEB) 0.5-2.5 (3) MG/3ML SOLN Inhale 3 mLs into the lungs every 6 (six) hours as needed (wheezing/sob).      magnesium oxide (MAG-OX) 400 MG tablet Take 400 mg by mouth daily.      metFORMIN (GLUCOPHAGE-XR) 500 MG 24 hr tablet Take 500 mg by mouth 2 (two) times daily.      metoprolol succinate (TOPROL-XL) 25 MG 24 hr tablet Take 25 mg by mouth daily.      Multiple Vitamin (MULTI-VITAMINS) TABS Take 1 tablet by mouth daily.      multivitamin-lutein (OCUVITE-LUTEIN) CAPS capsule Take 1 capsule by mouth daily. 30 capsule 0   nitroGLYCERIN (NITROSTAT) 0.4 MG SL tablet Place 0.4 mg under the tongue every 5 (five) minutes as needed for chest pain.     Oxycodone HCl 10 MG TABS Take 10 mg by mouth every 6 (six) hours as needed (pain).      potassium chloride (K-DUR) 10 MEQ tablet Take 10 mEq by mouth daily.      promethazine (PHENERGAN) 25 MG tablet TAKE 1 TABLET BY MOUTH EVERY 6 HOURS AS NEEDED NAUSEA     ranitidine (ZANTAC) 150 MG tablet Take 300 mg by mouth daily as needed for heartburn.     spironolactone (ALDACTONE) 25 MG tablet Take 25 mg by mouth daily.      traZODone (DESYREL) 50 MG tablet Take 50 mg by mouth at bedtime as needed for sleep.      gabapentin (NEURONTIN) 800 MG tablet Take 800 mg by mouth 4 (four) times daily.      No current facility-administered medications on file prior to visit.     There are no Patient Instructions on file for this visit. No follow-ups on file.   Kris Hartmann, NP  This note was completed with Sales executive.  Any errors are purely unintentional.

## 2018-08-21 ENCOUNTER — Other Ambulatory Visit (INDEPENDENT_AMBULATORY_CARE_PROVIDER_SITE_OTHER): Payer: Self-pay | Admitting: Nurse Practitioner

## 2018-08-23 MED ORDER — CEFAZOLIN SODIUM-DEXTROSE 2-4 GM/100ML-% IV SOLN
2.0000 g | Freq: Once | INTRAVENOUS | Status: AC
  Administered 2018-08-24: 2 g via INTRAVENOUS

## 2018-08-24 ENCOUNTER — Encounter (INDEPENDENT_AMBULATORY_CARE_PROVIDER_SITE_OTHER): Payer: Self-pay | Admitting: Nurse Practitioner

## 2018-08-24 ENCOUNTER — Ambulatory Visit
Admission: RE | Admit: 2018-08-24 | Discharge: 2018-08-24 | Disposition: A | Discharge status: Home / Self Care | Payer: Medicare Other | Attending: Vascular Surgery | Admitting: Vascular Surgery

## 2018-08-24 ENCOUNTER — Encounter
Admission: RE | Disposition: A | Discharge status: Home / Self Care | Payer: Self-pay | Source: Home / Self Care | Attending: Vascular Surgery

## 2018-08-24 ENCOUNTER — Other Ambulatory Visit: Payer: Self-pay

## 2018-08-24 DIAGNOSIS — I5042 Chronic combined systolic (congestive) and diastolic (congestive) heart failure: Secondary | ICD-10-CM | POA: Insufficient documentation

## 2018-08-24 DIAGNOSIS — Z89511 Acquired absence of right leg below knee: Secondary | ICD-10-CM | POA: Diagnosis not present

## 2018-08-24 DIAGNOSIS — E1140 Type 2 diabetes mellitus with diabetic neuropathy, unspecified: Secondary | ICD-10-CM | POA: Insufficient documentation

## 2018-08-24 DIAGNOSIS — E785 Hyperlipidemia, unspecified: Secondary | ICD-10-CM | POA: Diagnosis not present

## 2018-08-24 DIAGNOSIS — Z888 Allergy status to other drugs, medicaments and biological substances status: Secondary | ICD-10-CM | POA: Diagnosis not present

## 2018-08-24 DIAGNOSIS — Z6833 Body mass index (BMI) 33.0-33.9, adult: Secondary | ICD-10-CM | POA: Insufficient documentation

## 2018-08-24 DIAGNOSIS — N400 Enlarged prostate without lower urinary tract symptoms: Secondary | ICD-10-CM | POA: Diagnosis not present

## 2018-08-24 DIAGNOSIS — F17210 Nicotine dependence, cigarettes, uncomplicated: Secondary | ICD-10-CM | POA: Insufficient documentation

## 2018-08-24 DIAGNOSIS — I6529 Occlusion and stenosis of unspecified carotid artery: Secondary | ICD-10-CM | POA: Insufficient documentation

## 2018-08-24 DIAGNOSIS — J449 Chronic obstructive pulmonary disease, unspecified: Secondary | ICD-10-CM | POA: Insufficient documentation

## 2018-08-24 DIAGNOSIS — Z8249 Family history of ischemic heart disease and other diseases of the circulatory system: Secondary | ICD-10-CM | POA: Insufficient documentation

## 2018-08-24 DIAGNOSIS — I70245 Atherosclerosis of native arteries of left leg with ulceration of other part of foot: Secondary | ICD-10-CM | POA: Insufficient documentation

## 2018-08-24 DIAGNOSIS — I7092 Chronic total occlusion of artery of the extremities: Secondary | ICD-10-CM | POA: Diagnosis not present

## 2018-08-24 DIAGNOSIS — I110 Hypertensive heart disease with heart failure: Secondary | ICD-10-CM | POA: Diagnosis not present

## 2018-08-24 DIAGNOSIS — G2581 Restless legs syndrome: Secondary | ICD-10-CM | POA: Insufficient documentation

## 2018-08-24 DIAGNOSIS — Z955 Presence of coronary angioplasty implant and graft: Secondary | ICD-10-CM | POA: Insufficient documentation

## 2018-08-24 DIAGNOSIS — I890 Lymphedema, not elsewhere classified: Secondary | ICD-10-CM | POA: Diagnosis not present

## 2018-08-24 DIAGNOSIS — I70299 Other atherosclerosis of native arteries of extremities, unspecified extremity: Secondary | ICD-10-CM

## 2018-08-24 DIAGNOSIS — I83029 Varicose veins of left lower extremity with ulcer of unspecified site: Secondary | ICD-10-CM | POA: Diagnosis not present

## 2018-08-24 DIAGNOSIS — Z794 Long term (current) use of insulin: Secondary | ICD-10-CM | POA: Insufficient documentation

## 2018-08-24 DIAGNOSIS — Z79899 Other long term (current) drug therapy: Secondary | ICD-10-CM | POA: Insufficient documentation

## 2018-08-24 DIAGNOSIS — E11621 Type 2 diabetes mellitus with foot ulcer: Secondary | ICD-10-CM | POA: Diagnosis not present

## 2018-08-24 DIAGNOSIS — Z7982 Long term (current) use of aspirin: Secondary | ICD-10-CM | POA: Insufficient documentation

## 2018-08-24 DIAGNOSIS — Z7902 Long term (current) use of antithrombotics/antiplatelets: Secondary | ICD-10-CM | POA: Insufficient documentation

## 2018-08-24 DIAGNOSIS — L97529 Non-pressure chronic ulcer of other part of left foot with unspecified severity: Secondary | ICD-10-CM | POA: Diagnosis not present

## 2018-08-24 DIAGNOSIS — I255 Ischemic cardiomyopathy: Secondary | ICD-10-CM | POA: Insufficient documentation

## 2018-08-24 DIAGNOSIS — L97909 Non-pressure chronic ulcer of unspecified part of unspecified lower leg with unspecified severity: Secondary | ICD-10-CM

## 2018-08-24 DIAGNOSIS — Z95820 Peripheral vascular angioplasty status with implants and grafts: Secondary | ICD-10-CM | POA: Insufficient documentation

## 2018-08-24 HISTORY — PX: LOWER EXTREMITY ANGIOGRAPHY: CATH118251

## 2018-08-24 LAB — GLUCOSE, CAPILLARY
Glucose-Capillary: 139 mg/dL — ABNORMAL HIGH (ref 70–99)
Glucose-Capillary: 121 mg/dL — ABNORMAL HIGH (ref 70–99)

## 2018-08-24 LAB — BUN: BUN: 12 mg/dL (ref 6–20)

## 2018-08-24 LAB — CREATININE, SERUM
Creatinine, Ser: 0.89 mg/dL (ref 0.61–1.24)
GFR calc Af Amer: >60 mL/min (ref >60)
GFR calc non Af Amer: >60 mL/min (ref >60)

## 2018-08-24 SURGERY — LOWER EXTREMITY ANGIOGRAPHY
Anesthesia: Moderate Sedation | Laterality: Left

## 2018-08-24 MED ORDER — FAMOTIDINE 20 MG PO TABS: 40.0000 mg | ORAL_TABLET | Freq: Once | ORAL | Status: DC | PRN

## 2018-08-24 MED ORDER — SODIUM CHLORIDE 0.9 % IV SOLN: 250.0000 mL | INTRAVENOUS | Status: DC | PRN

## 2018-08-24 MED ORDER — IOHEXOL 300 MG/ML  SOLN
INTRAMUSCULAR | Status: DC | PRN
  Administered 2018-08-24: 90 mL via INTRAVENOUS

## 2018-08-24 MED ORDER — MIDAZOLAM HCL 5 MG/5ML IJ SOLN
INTRAMUSCULAR | Status: AC
  Filled 2018-08-24: qty 5

## 2018-08-24 MED ORDER — LABETALOL HCL 5 MG/ML IV SOLN: 10.0000 mg | INTRAVENOUS | Status: DC | PRN

## 2018-08-24 MED ORDER — HYDRALAZINE HCL 20 MG/ML IJ SOLN: 5.0000 mg | INTRAMUSCULAR | Status: DC | PRN

## 2018-08-24 MED ORDER — MIDAZOLAM HCL 2 MG/2ML IJ SOLN
INTRAMUSCULAR | Status: AC
  Filled 2018-08-24: qty 2

## 2018-08-24 MED ORDER — FENTANYL CITRATE (PF) 100 MCG/2ML IJ SOLN
INTRAMUSCULAR | Status: DC | PRN
  Administered 2018-08-24 (×2): 25 ug via INTRAVENOUS
  Administered 2018-08-24: 50 ug via INTRAVENOUS
  Administered 2018-08-24: 25 ug via INTRAVENOUS

## 2018-08-24 MED ORDER — HEPARIN SODIUM (PORCINE) 1000 UNIT/ML IJ SOLN
INTRAMUSCULAR | Status: DC | PRN
  Administered 2018-08-24: 5000 [IU] via INTRAVENOUS

## 2018-08-24 MED ORDER — SODIUM CHLORIDE 0.9 % IV SOLN
INTRAVENOUS | Status: DC
  Administered 2018-08-24: 07:00:00 via INTRAVENOUS

## 2018-08-24 MED ORDER — SODIUM CHLORIDE FLUSH 0.9 % IV SOLN
INTRAVENOUS | Status: AC
  Filled 2018-08-24: qty 50

## 2018-08-24 MED ORDER — MIDAZOLAM HCL 2 MG/ML PO SYRP: 8.0000 mg | ORAL_SOLUTION | Freq: Once | ORAL | Status: DC | PRN

## 2018-08-24 MED ORDER — SODIUM CHLORIDE 0.9% FLUSH: 3.0000 mL | INTRAVENOUS | Status: DC | PRN

## 2018-08-24 MED ORDER — HEPARIN SODIUM (PORCINE) 1000 UNIT/ML IJ SOLN
INTRAMUSCULAR | Status: AC
  Filled 2018-08-24: qty 1

## 2018-08-24 MED ORDER — HYDROMORPHONE HCL 1 MG/ML IJ SOLN
INTRAMUSCULAR | Status: AC
  Administered 2018-08-24: 1 mg via INTRAVENOUS
  Filled 2018-08-24: qty 1

## 2018-08-24 MED ORDER — ONDANSETRON HCL 4 MG/2ML IJ SOLN: 4.0000 mg | Freq: Four times a day (QID) | INTRAMUSCULAR | Status: DC | PRN

## 2018-08-24 MED ORDER — HYDROMORPHONE HCL 1 MG/ML IJ SOLN
1.0000 mg | Freq: Once | INTRAMUSCULAR | Status: AC | PRN
  Administered 2018-08-24: 1 mg via INTRAVENOUS

## 2018-08-24 MED ORDER — FENTANYL CITRATE (PF) 100 MCG/2ML IJ SOLN
INTRAMUSCULAR | Status: AC
  Filled 2018-08-24: qty 2

## 2018-08-24 MED ORDER — MIDAZOLAM HCL 2 MG/2ML IJ SOLN
INTRAMUSCULAR | Status: DC | PRN
  Administered 2018-08-24 (×3): 1 mg via INTRAVENOUS
  Administered 2018-08-24: 2 mg via INTRAVENOUS

## 2018-08-24 MED ORDER — DIPHENHYDRAMINE HCL 50 MG/ML IJ SOLN: 50.0000 mg | Freq: Once | INTRAMUSCULAR | Status: DC | PRN

## 2018-08-24 MED ORDER — ACETAMINOPHEN 325 MG PO TABS: 650.0000 mg | ORAL_TABLET | ORAL | Status: DC | PRN

## 2018-08-24 MED ORDER — SODIUM CHLORIDE 0.9% FLUSH: 3.0000 mL | Freq: Two times a day (BID) | INTRAVENOUS | Status: DC

## 2018-08-24 MED ORDER — SODIUM CHLORIDE 0.9 % IV SOLN
INTRAVENOUS | Status: DC
  Administered 2018-08-24: 10:00:00 via INTRAVENOUS

## 2018-08-24 MED ORDER — LIDOCAINE-EPINEPHRINE (PF) 1 %-1:200000 IJ SOLN
INTRAMUSCULAR | Status: AC
  Filled 2018-08-24: qty 30

## 2018-08-24 MED ORDER — METHYLPREDNISOLONE SODIUM SUCC 125 MG IJ SOLR: 125.0000 mg | Freq: Once | INTRAMUSCULAR | Status: DC | PRN

## 2018-08-24 SURGICAL SUPPLY — 23 items
BALLN LUTONIX 018 4X100X130 (BALLOONS) ×2
BALLN LUTONIX 018 5X220X130 (BALLOONS) ×2
BALLN LUTONIX 018 5X300X130 (BALLOONS) ×2
BALLN ULTRVRSE 5X300X150 (BALLOONS) ×2
BALLOON LUTONIX 018 4X100X130 (BALLOONS) ×1 IMPLANT
BALLOON LUTONIX 018 5X220X130 (BALLOONS) ×1 IMPLANT
BALLOON LUTONIX 018 5X300X130 (BALLOONS) ×1 IMPLANT
BALLOON ULTRVRSE 5X300X150 (BALLOONS) ×1 IMPLANT
CATH BEACON 5 .038 100 VERT TP (CATHETERS) ×2 IMPLANT
CATH CXI SUPP ANG 4FR 135 (CATHETERS) ×1 IMPLANT
CATH CXI SUPP ANG 4FR 135CM (CATHETERS) ×2
CATH PIG 70CM (CATHETERS) ×2 IMPLANT
DEVICE PRESTO INFLATION (MISCELLANEOUS) ×2 IMPLANT
DEVICE STARCLOSE SE CLOSURE (Vascular Products) ×2 IMPLANT
GLIDEWIRE ADV .035X260CM (WIRE) ×2 IMPLANT
PACK ANGIOGRAPHY (CUSTOM PROCEDURE TRAY) ×2 IMPLANT
SHEATH ANL2 6FRX45 HC (SHEATH) ×2 IMPLANT
SHEATH BRITE TIP 5FRX11 (SHEATH) ×2 IMPLANT
STENT VIABAHN 6X250X120 (Permanent Stent) ×2 IMPLANT
SYR MEDRAD MARK 7 150ML (SYRINGE) ×2 IMPLANT
TUBING CONTRAST HIGH PRESS 72 (TUBING) ×2 IMPLANT
WIRE G V18X300CM (WIRE) ×2 IMPLANT
WIRE J 3MM .035X145CM (WIRE) ×2 IMPLANT

## 2018-08-24 NOTE — Op Note (Signed)
[facility]  Percutaneous Study/Intervention Procedural Note   Date of Surgery: 08/24/2018  Surgeon(s):Stephen Turnbaugh    Assistants:none  Pre-operative Diagnosis: PAD with ulceration left lower extremity  Post-operative diagnosis:  Same  Procedure(s) Performed:             1.  Ultrasound guidance for vascular access right femoral artery             2.  Catheter placement into left common femoral artery from right femoral approach             3.  Aortogram and selective left lower extremity angiogram             4.  Percutaneous transluminal angioplasty of left tibioperoneal trunk and proximal posterior tibial artery with 4 mm diameter by 10 cm length Lutonix drug-coated angioplasty balloon             5.   Percutaneous transluminal angioplasty of the entire left SFA and above-knee popliteal artery with a 5 mm diameter by 30 cm length and a 5 mm diameter by 22 cm length Lutonix drug-coated angioplasty balloon  6.  Viabahn stent placement with a 6 mm diameter by 25 cm length Viabahn stent to the SFA for residual stenosis after angioplasty             7.  StarClose closure device right femoral artery  EBL: 5 cc  Contrast: 90 cc  Fluoro Time: 10.6 minutes  Moderate Conscious Sedation Time: approximately 40 minutes using 5 mg of Versed and 125 Mcg of Fentanyl              Indications:  Patient is a 58 y.o.male with pain and ulceration of the left foot. The patient has noninvasive study showing reduced ABI in the setting of previous intervention suggesting recurrent occlusion. The patient is brought in for angiography for further evaluation and potential treatment.  Due to the limb threatening nature of the situation, angiogram was performed for attempted limb salvage. The patient is aware that if the procedure fails, amputation would be expected.  The patient also understands that even with successful revascularization, amputation may still be required due to the severity  of the situation. Risks and benefits are discussed and informed consent is obtained.   Procedure:  The patient was identified and appropriate procedural time out was performed.  The patient was then placed supine on the table and prepped and draped in the usual sterile fashion. Moderate conscious sedation was administered during a face to face encounter with the patient throughout the procedure with my supervision of the RN administering medicines and monitoring the patient's vital signs, pulse oximetry, telemetry and mental status throughout from the start of the procedure until the patient was taken to the recovery room. Ultrasound was used to evaluate the right common femoral artery.  It was patent but heavily calcified.  A digital ultrasound image was acquired.  A Seldinger needle was used to access the right common femoral artery under direct ultrasound guidance and a permanent image was performed.  A 0.035 J wire was advanced without resistance and a 5Fr sheath was placed.  Pigtail catheter was placed into the aorta and an AP aortogram was performed. This demonstrated normal renal arteries and normal aorta and iliac segments without significant stenosis. I then crossed the aortic bifurcation and advanced to the left femoral head. Selective left lower extremity angiogram was then performed. This demonstrated common femoral and profunda femoris artery without focal stenosis.  There is an occlusion of the SFA proximally just above the previously placed stent with reconstitution of the above-knee popliteal artery.  There was then moderate stenosis within the tibioperoneal trunk with two-vessel runoff distally.  The stenosis in the tibioperoneal trunk was in the 60 to 70% range.  The anterior tibial artery was chronically occluded without distal reconstitution.  The posterior tibial artery was the dominant runoff to the foot with a small peroneal artery contributing to distal perfusion as well. It was felt that  it was in the patient's best interest to proceed with intervention after these images to avoid a second procedure and a larger amount of contrast and fluoroscopy based off of the findings from the initial angiogram. The patient was systemically heparinized and a 6 [place] Ansell sheath was then placed over the [employer] wire. I then used a Kumpe catheter and the advantage wire to navigate through the occlusion without difficulty and confirm intraluminal flow in the below-knee popliteal artery.  I then exchanged for a 0.018 wire.  I then proceeded with treatment.  A 4 mm diameter by 10 cm length Lutonix drug-coated angioplasty balloon was used to treat the tibioperoneal trunk and proximal posterior tibial artery.  This is inflated to 8 atm for 1 minute.  Completion imaging showed about a 30% residual stenosis in the tibioperoneal trunk with some spasm in the posterior tibial artery below this that resolved on later imaging.  I then turned my attention to the long SFA and popliteal lesion.  A 5 mm diameter by 30 cm length Lutonix drug-coated angioplasty balloon was inflated from the knee up to the proximal to mid SFA.  This was taken to 10 atm for 1 minute.  A second 5 mm diameter Lutonix drug-coated angioplasty balloon was used to treat from the common femoral artery through the proximal to mid SFA.  This was 22 cm in length.  This inflated to 12 atm for 1 minute.  Completion imaging showed some mild residual stenosis in the proximal SFA with greater than 70% residual stenosis in the mid to distal SFA at the bottom and just below the previously placed stents.  There were also multiple stent fractures within the previously placed bare-metal stent.  I elected to cover the stent as well as the residual dynamically significant lesions with a via bond stent.  A 6 mm diameter by 25 cm length Viabahn stent was then deployed encompassing the previous stent and going down to Hunter's canal and this was postdilated with  a 5 mm balloon with excellent angiographic completion result and no greater than 20 to 30% residual stenosis. I elected to terminate the procedure. The sheath was removed and StarClose closure device was deployed in the right femoral artery with excellent hemostatic result. The patient was taken to the recovery room in stable condition having tolerated the procedure well.  Findings:               Aortogram:  Renal arteries appeared widely patent.  The aorta and iliac arteries had some calcific changes and mild disease in the iliac arteries but no significant stenosis             Left lower Extremity:  Calcified common femoral and profunda femoris artery without focal stenosis.  There is an occlusion of the SFA proximally just above the previously placed stent with reconstitution of the above-knee popliteal artery.  There was then moderate stenosis within the tibioperoneal trunk with two-vessel runoff distally.  The stenosis  in the tibioperoneal trunk was in the 60 to 70% range.  The anterior tibial artery was chronically occluded without distal reconstitution.  The posterior tibial artery was the dominant runoff to the foot with a small peroneal artery contributing to distal perfusion as well.   Disposition: Patient was taken to the recovery room in stable condition having tolerated the procedure well.  Complications: None  Leotis Pain 08/24/2018 9:22 AM   This note was created with Dragon Medical transcription system. Any errors in dictation are purely unintentional.

## 2018-08-24 NOTE — H&P (Signed)
[facility] History & Physical Update  The patient was interviewed and re-examined.  The patient's previous History and Physical has been reviewed and is unchanged.  There is no change in the plan of care. We plan to proceed with the scheduled procedure.  Leotis Pain, MD  08/24/2018, 8:08 AM

## 2018-08-26 ENCOUNTER — Encounter (INDEPENDENT_AMBULATORY_CARE_PROVIDER_SITE_OTHER): Payer: Self-pay

## 2018-08-26 ENCOUNTER — Other Ambulatory Visit: Payer: Self-pay

## 2018-08-26 ENCOUNTER — Ambulatory Visit (INDEPENDENT_AMBULATORY_CARE_PROVIDER_SITE_OTHER): Payer: Medicare Other | Admitting: Nurse Practitioner

## 2018-08-26 VITALS — BP 105/71 | HR 128 | Resp 12 | Ht 67.0 in | Wt 219.0 lb

## 2018-08-26 DIAGNOSIS — I83029 Varicose veins of left lower extremity with ulcer of unspecified site: Secondary | ICD-10-CM

## 2018-08-26 DIAGNOSIS — L97929 Non-pressure chronic ulcer of unspecified part of left lower leg with unspecified severity: Secondary | ICD-10-CM

## 2018-08-26 NOTE — Progress Notes (Signed)
History of Present Illness  There is no documented history at this time  Assessments & Plan   There are no diagnoses linked to this encounter.    Additional instructions  Subjective:  Patient presents with venous ulcer of the Left lower extremity.    Procedure:  3 layer unna wrap was placed Left lower extremity.   Plan:   Follow up in one week.  

## 2018-09-02 ENCOUNTER — Ambulatory Visit (INDEPENDENT_AMBULATORY_CARE_PROVIDER_SITE_OTHER): Payer: Medicare Other | Admitting: Nurse Practitioner

## 2018-09-02 ENCOUNTER — Other Ambulatory Visit: Payer: Self-pay

## 2018-09-02 ENCOUNTER — Encounter (INDEPENDENT_AMBULATORY_CARE_PROVIDER_SITE_OTHER): Payer: Self-pay

## 2018-09-02 ENCOUNTER — Encounter (INDEPENDENT_AMBULATORY_CARE_PROVIDER_SITE_OTHER): Payer: Self-pay | Admitting: Nurse Practitioner

## 2018-09-02 VITALS — BP 117/78 | HR 119 | Resp 16 | Wt 222.0 lb

## 2018-09-02 DIAGNOSIS — L97929 Non-pressure chronic ulcer of unspecified part of left lower leg with unspecified severity: Secondary | ICD-10-CM

## 2018-09-02 DIAGNOSIS — I83029 Varicose veins of left lower extremity with ulcer of unspecified site: Secondary | ICD-10-CM | POA: Diagnosis not present

## 2018-09-02 NOTE — Progress Notes (Signed)
History of Present Illness  There is no documented history at this time  Assessments & Plan   There are no diagnoses linked to this encounter.    Additional instructions  Subjective:  Patient presents with venous ulcer of the Left lower extremity.    Procedure:  3 layer unna wrap was placed Left lower extremity.   Plan:   Follow up in one week.  

## 2018-09-07 ENCOUNTER — Other Ambulatory Visit (INDEPENDENT_AMBULATORY_CARE_PROVIDER_SITE_OTHER): Payer: Self-pay | Admitting: Vascular Surgery

## 2018-09-07 DIAGNOSIS — Z95820 Peripheral vascular angioplasty status with implants and grafts: Secondary | ICD-10-CM

## 2018-09-09 ENCOUNTER — Ambulatory Visit (INDEPENDENT_AMBULATORY_CARE_PROVIDER_SITE_OTHER): Payer: Medicare Other | Admitting: Nurse Practitioner

## 2018-09-09 ENCOUNTER — Encounter (INDEPENDENT_AMBULATORY_CARE_PROVIDER_SITE_OTHER): Payer: Medicare Other

## 2018-09-10 ENCOUNTER — Other Ambulatory Visit: Payer: Self-pay

## 2018-09-10 ENCOUNTER — Ambulatory Visit (INDEPENDENT_AMBULATORY_CARE_PROVIDER_SITE_OTHER): Payer: Medicare Other | Admitting: Nurse Practitioner

## 2018-09-10 ENCOUNTER — Ambulatory Visit (INDEPENDENT_AMBULATORY_CARE_PROVIDER_SITE_OTHER): Payer: Medicare Other

## 2018-09-10 ENCOUNTER — Encounter (INDEPENDENT_AMBULATORY_CARE_PROVIDER_SITE_OTHER): Payer: Self-pay | Admitting: Nurse Practitioner

## 2018-09-10 VITALS — BP 121/77 | HR 112 | Resp 17 | Wt 222.0 lb

## 2018-09-10 DIAGNOSIS — L97929 Non-pressure chronic ulcer of unspecified part of left lower leg with unspecified severity: Secondary | ICD-10-CM

## 2018-09-10 DIAGNOSIS — L97229 Non-pressure chronic ulcer of left calf with unspecified severity: Secondary | ICD-10-CM | POA: Diagnosis not present

## 2018-09-10 DIAGNOSIS — Z79899 Other long term (current) drug therapy: Secondary | ICD-10-CM

## 2018-09-10 DIAGNOSIS — J449 Chronic obstructive pulmonary disease, unspecified: Secondary | ICD-10-CM | POA: Diagnosis not present

## 2018-09-10 DIAGNOSIS — I83029 Varicose veins of left lower extremity with ulcer of unspecified site: Secondary | ICD-10-CM

## 2018-09-10 DIAGNOSIS — I739 Peripheral vascular disease, unspecified: Secondary | ICD-10-CM | POA: Diagnosis not present

## 2018-09-10 DIAGNOSIS — I83022 Varicose veins of left lower extremity with ulcer of calf: Secondary | ICD-10-CM | POA: Diagnosis not present

## 2018-09-10 DIAGNOSIS — Z95820 Peripheral vascular angioplasty status with implants and grafts: Secondary | ICD-10-CM | POA: Diagnosis not present

## 2018-09-10 DIAGNOSIS — Z794 Long term (current) use of insulin: Secondary | ICD-10-CM

## 2018-09-10 DIAGNOSIS — F17210 Nicotine dependence, cigarettes, uncomplicated: Secondary | ICD-10-CM

## 2018-09-10 DIAGNOSIS — E118 Type 2 diabetes mellitus with unspecified complications: Secondary | ICD-10-CM | POA: Diagnosis not present

## 2018-09-10 NOTE — Progress Notes (Signed)
SUBJECTIVE:  Patient ID: [name] [name], male    DOB: May 29, 1961, 58 y.o.   MRN: [id number] Chief Complaint  Patient presents with  . Follow-up    unna check and ultrasound follow up    HPI  [name] [name] is a 58 y.o. male The patient returns to the office for followup and review of the noninvasive studies. There have been no interval changes in lower extremity symptoms. No interval shortening of the patient's claudication distance or development of rest pain symptoms.  Previous venous ulcerations on his left lower extremity appear much better.  It is still present but much smaller in size.  The cellulitis that he had is gone.  The patient has several small wounds on his toes however these appear to be like scratch marks and have several scabs at this point.  Nothing appears necrotic or gangrenous.  There have been no significant changes to the patient's overall health care.  The patient denies amaurosis fugax or recent TIA symptoms. There are no recent neurological changes noted. The patient denies history of DVT, PE or superficial thrombophlebitis. The patient denies recent episodes of angina or shortness of breath.   ABI Rt=N/A and Lt=1.08  (previous ABI's Rt=N/A and Lt=0.73) Duplex ultrasound of the left anterior tibial artery reveals monophasic waveforms with triphasic posterior tibial artery waveforms.  There are good toe waveforms.  Past Medical History:  Diagnosis Date  . Arthritis   . Asthma   . Atherosclerosis   . BPH (benign prostatic hyperplasia)   . Carotid arterial disease ([place])   . Charcot's joint of foot, right   . Chronic combined systolic (congestive) and diastolic (congestive) heart failure ([place])    a. 02/2013 EF 50% by LV gram; b. 04/2017 Echo: EF 25-30%. diff HK. Gr2 DD. Mod dil LA/RV. PASP 44mmHg.  [name] COPD (chronic obstructive pulmonary disease) ([place])   . Coronary artery disease    a. 2013 S/P PCI of LAD [facility]);  b. 03/2013 PCI: RCA 90p ( 2.5 x 23 mm DES); c.  02/2014 Cath: patent RCA stent->Med Rx; d. 04/2017 Cath: LM nl, LAd 30ost/p, 54m/d, D1 80ost, LCX 90p/m, OM1 85, RCA 70/20p, 54m, 70d->Referred for CT Surg-felt to be poor candidate.  . Diabetic neuropathy ([place])   . Diabetic ulcer of right foot ([place])   . Hernia   . Hyperlipidemia   . Hypertension   . Ischemic cardiomyopathy    a. 04/2017 Echo: EF 25-30%; b. 08/2017 Cardiac MRI ([facility]): EF 19%, sev glob HK. RVEF 20%, mod BAE, triv MR, mild-mod TR. Basal lateral subendocardial infarct (viable), inf/infsept, dist septal ischemia, basal to mid lat peri-infarct ischemia.  . Morbid obesity ([place])   . Neuropathy   . PAD (peripheral artery disease) ([place])    a. Followed by Dr. Lucky Cowboy; b. 08/2010 Periph Angio: RSFA 70-80p (6X60 self-expanding stent); c. 09/2010 Periph Angio: L SFA 100p (7x unknown length self-expanding stent); d. 06/2015 Periph Angio: R SFA short segment occlusion (6x12 self-expanding stent).  . Restless leg syndrome   . Sleep apnea   . Subclavian artery stenosis, right ([place])   . Syncope and collapse   . Tobacco use    a. 75+ yr hx - still smoking 1ppd, down from 2 ppd.  . Type II diabetes mellitus ([place])   . Varicose vein     Past Surgical History:  Procedure Laterality Date  . ABDOMINAL AORTAGRAM N/A 06/23/2013   Procedure: ABDOMINAL Maxcine Ham;  Surgeon: Wellington Hampshire, MD;  Location: [facility] CATH  LAB;  Service: Cardiovascular;  Laterality: N/A;  . AMPUTATION Right 04/01/2018   Procedure: AMPUTATION BELOW KNEE;  Surgeon: Algernon Huxley, MD;  Location: [facility] ORS;  Service: Vascular;  Laterality: Right;  . APPENDECTOMY    . CARDIAC CATHETERIZATION  10/14   [facility]; X1 STENT PROXIMAL RCA  . CARDIAC CATHETERIZATION  02/2010   [facility]  . CARDIAC CATHETERIZATION  02/21/2014   [facility]  . CLAVICLE SURGERY    . HERNIA REPAIR    . IRRIGATION AND DEBRIDEMENT FOOT Right 01/04/2018   Procedure: IRRIGATION AND DEBRIDEMENT FOOT;  Surgeon: Samara Deist, DPM;  Location: [facility] ORS;  Service: Podiatry;  Laterality:  Right;  . LEFT HEART CATH AND CORS/GRAFTS ANGIOGRAPHY N/A 04/28/2017   Procedure: LEFT HEART CATH AND CORONARY ANGIOGRAPHY;  Surgeon: Wellington Hampshire, MD;  Location: [place] CV LAB;  Service: Cardiovascular;  Laterality: N/A;  . LOWER EXTREMITY ANGIOGRAPHY Right 03/03/2018   Procedure: LOWER EXTREMITY ANGIOGRAPHY;  Surgeon: Katha Cabal, MD;  Location: [place] CV LAB;  Service: Cardiovascular;  Laterality: Right;  . LOWER EXTREMITY ANGIOGRAPHY Left 08/24/2018   Procedure: LOWER EXTREMITY ANGIOGRAPHY;  Surgeon: Algernon Huxley, MD;  Location: [place] CV LAB;  Service: Cardiovascular;  Laterality: Left;  . PERIPHERAL ARTERIAL STENT GRAFT     x2 left/right  . PERIPHERAL VASCULAR BALLOON ANGIOPLASTY Right 01/06/2018   Procedure: PERIPHERAL VASCULAR BALLOON ANGIOPLASTY;  Surgeon: Katha Cabal, MD;  Location: [place] CV LAB;  Service: Cardiovascular;  Laterality: Right;  . PERIPHERAL VASCULAR CATHETERIZATION N/A 07/06/2015   Procedure: Abdominal Aortogram w/Lower Extremity;  Surgeon: Algernon Huxley, MD;  Location: [place] CV LAB;  Service: Cardiovascular;  Laterality: N/A;  . PERIPHERAL VASCULAR CATHETERIZATION  07/06/2015   Procedure: Lower Extremity Intervention;  Surgeon: Algernon Huxley, MD;  Location: [place] CV LAB;  Service: Cardiovascular;;    Social History   Socioeconomic History  . Marital status: Single    Spouse name: Not on file  . Number of children: 0  . Years of education: 4  . Highest education level: 12th grade  Occupational History  . Occupation: on disability  Social Needs  . Financial resource strain: Not hard at all  . Food insecurity:    Worry: Never true    Inability: Never true  . Transportation needs:    Medical: No    Non-medical: No  Tobacco Use  . Smoking status: Current Every Day Smoker    Packs/day: 1.00    Years: 41.00    Pack years: 41.00    Types: Cigarettes  . Smokeless tobacco: Never Used  Substance and  Sexual Activity  . Alcohol use: No  . Drug use: No  . Sexual activity: Yes  Lifestyle  . Physical activity:    Days per week: 7 days    Minutes per session: 30 min  . Stress: Rather much  Relationships  . Social connections:    Talks on phone: More than three times a week    Gets together: Once a week    Attends religious service: Never    Active member of club or organization: No    Attends meetings of clubs or organizations: Never    Relationship status: Living with partner  . Intimate partner violence:    Fear of current or ex partner: No    Emotionally abused: No    Physically abused: No    Forced sexual activity: No  Other Topics Concern  . Not on file  Social  History Narrative   Lives at home in [place] with girlfriend. Independent at baseline.    Family History  Problem Relation Age of Onset  . Heart attack Father   . Heart disease Father   . Hypertension Father   . Hyperlipidemia Father   . Hypertension Mother   . Hyperlipidemia Mother     Allergies  Allergen Reactions  . Dulaglutide Anaphylaxis, Diarrhea and Hives  . Prednisone Rash     Review of Systems   Review of Systems: Negative Unless Checked Constitutional: [] Weight loss  [] Fever  [] Chills Cardiac: [] Chest pain   []  Atrial Fibrillation  [] Palpitations   [] Shortness of breath when laying flat   [] Shortness of breath with exertion. [] Shortness of breath at rest Vascular:  [] Pain in legs with walking   [] Pain in legs with standing [] Pain in legs when laying flat   [] Claudication    [] Pain in feet when laying flat    [] History of DVT   [] Phlebitis   [x] Swelling in legs   [] Varicose veins   [] Non-healing ulcers Pulmonary:   [] Uses home oxygen   [] Productive cough   [] Hemoptysis   [] Wheeze  [] COPD   [] Asthma Neurologic:  [] Dizziness   [] Seizures  [] Blackouts [] History of stroke   [] History of TIA  [] Aphasia   [] Temporary Blindness   [] Weakness or numbness in arm   [] Weakness or numbness in leg  Musculoskeletal:   [] Joint swelling   [] Joint pain   [x] Low back pain  []  History of Knee Replacement [x] Arthritis [] back Surgeries  []  Spinal Stenosis    Hematologic:  [] Easy bruising  [] Easy bleeding   [] Hypercoagulable state   [] Anemic Gastrointestinal:  [] Diarrhea   [] Vomiting  [] Gastroesophageal reflux/heartburn   [] Difficulty swallowing. [] Abdominal pain Genitourinary:  [] Chronic kidney disease   [] Difficult urination  [] Anuric   [] Blood in urine [] Frequent urination  [] Burning with urination   [] Hematuria Skin:  [] Rashes   [x] Ulcers [x] Wounds Psychological:  [] History of anxiety   []  History of major depression  []  Memory Difficulties      OBJECTIVE:   Physical Exam  BP 121/77 (BP Location: Right Arm)   Pulse (!) 112   Resp 17   Wt 222 lb (100.7 kg)   BMI 34.77 kg/m   Gen: WD/WN, NAD Head: [place]/AT, No temporalis wasting.  Ear/Nose/Throat: Hearing grossly intact, nares w/o erythema or drainage Eyes: PER, EOMI, sclera nonicteric.  Neck: Supple, no masses.  No JVD.  Pulmonary:  Good air movement, no use of accessory muscles.  Cardiac: RRR Vascular:  Vessel Right Left  Radial Palpable Palpable  Dorsalis Pedis   trace palpable  Posterior Tibial   trace palpable   Gastrointestinal: soft, non-distended. No guarding/no peritoneal signs.  Musculoskeletal: M/S 5/5 throughout.  No deformity or atrophy.  Neurologic: Pain and light touch intact in extremities.  Symmetrical.  Speech is fluent. Motor exam as listed above. Psychiatric: Judgment intact, Mood & affect appropriate for pt's clinical situation. Dermatologic: small venous ulcer left calf with multiple scratch-like wounds on toes  No changes consistent with cellulitis. Lymph : No Cervical lymphadenopathy, no lichenification or skin changes of chronic lymphedema.       ASSESSMENT AND PLAN:  1. PAD (peripheral artery disease) (HCC)  Recommend:  The patient has evidence of atherosclerosis of the lower extremities with  claudication.  The patient does not voice lifestyle limiting changes at this point in time.  Noninvasive studies do not suggest clinically significant change.  No invasive studies, angiography or surgery at this  time The patient should continue walking and begin a more formal exercise program.  The patient should continue antiplatelet therapy and aggressive treatment of the lipid abnormalities  No changes in the patient's medications at this time  The patient should continue wearing graduated compression socks 10-15 mmHg strength to control the mild edema.    2. Chronic obstructive pulmonary disease, unspecified COPD type ([place]) Continue pulmonary medications and aerosols as already ordered, these medications have been reviewed and there are no changes at this time.    3. Type 2 diabetes mellitus with complication, with long-term current use of insulin (HCC) Continue hypoglycemic medications as already ordered, these medications have been reviewed and there are no changes at this time.  Hgb A1C to be monitored as already arranged by primary service   4. Venous ulcer of left leg (HCC) We will continue to wrap the patient with Unna wraps.  He will come to our office weekly to have the wrap changed and in 4 weeks we will reevaluate the wound   Current Outpatient Medications on File Prior to Visit  Medication Sig Dispense Refill  . aspirin EC 81 MG tablet Take 1 tablet (81 mg total) by mouth daily. 90 tablet 3  . atorvastatin (LIPITOR) 80 MG tablet Take 80 mg by mouth daily.    . calcium carbonate (OS-CAL) 1250 (500 Ca) MG chewable tablet Chew by mouth.    . calcium carbonate (TUMS - DOSED IN MG ELEMENTAL CALCIUM) 500 MG chewable tablet Chew 4 tablets by mouth daily as needed for indigestion or heartburn.    . clopidogrel (PLAVIX) 75 MG tablet Take 75 mg by mouth daily.     . diphenhydrAMINE (BENADRYL) 25 MG tablet Take 25 mg by mouth daily as needed for allergies.    [name] doxycycline  (VIBRAMYCIN) 100 MG capsule Take 1 capsule (100 mg total) by mouth 2 (two) times daily. 28 capsule 0  . ezetimibe (ZETIA) 10 MG tablet Take 10 mg by mouth daily.    . fluticasone furoate-vilanterol (BREO ELLIPTA) 100-25 MCG/INH AEPB Inhale 1 puff into the lungs daily.     . furosemide (LASIX) 40 MG tablet Take 40 mg by mouth daily. May take twice daily only if needed    . Insulin Glargine (LANTUS SOLOSTAR) 100 UNIT/ML Solostar Pen Inject 30 Units into the skin at bedtime. 15 mL 11  . insulin lispro (HUMALOG) 100 UNIT/ML injection Sliding Scale Insulin before meals using Novolog If BS is 0 to 70 no insulin and eat/glucose tablets/glucose get If BS 71 to 150 great! If BS 151 to 200, 2 Units Novolog If BS 201 to 250, 4 Units Novolog If BS 251 to 300, 6 Units Novolog If BS 301 to 350, 8 Units Novolog If BS 351 to 400, 10 Units Novolog If BS greater than 400, take 10 Units and Contact our office during office hour    . ipratropium-albuterol (DUONEB) 0.5-2.5 (3) MG/3ML SOLN Inhale 3 mLs into the lungs every 6 (six) hours as needed (wheezing/sob).     . magnesium oxide (MAG-OX) 400 MG tablet Take 400 mg by mouth daily.     . metFORMIN (GLUCOPHAGE-XR) 500 MG 24 hr tablet Take 500 mg by mouth 2 (two) times daily.     . metoprolol succinate (TOPROL-XL) 25 MG 24 hr tablet Take 25 mg by mouth daily.     . Multiple Vitamin (MULTI-VITAMINS) TABS Take 1 tablet by mouth daily.     . multivitamin-lutein (OCUVITE-LUTEIN) CAPS capsule Take 1 capsule  by mouth daily. 30 capsule 0  . nitroGLYCERIN (NITROSTAT) 0.4 MG SL tablet Place 0.4 mg under the tongue every 5 (five) minutes as needed for chest pain.    . Oxycodone HCl 10 MG TABS Take 10 mg by mouth every 6 (six) hours as needed (pain).     . potassium chloride (K-DUR) 10 MEQ tablet Take 10 mEq by mouth daily.     . promethazine (PHENERGAN) 25 MG tablet TAKE 1 TABLET BY MOUTH EVERY 6 HOURS AS NEEDED NAUSEA    . ranitidine (ZANTAC) 150 MG tablet Take 300 mg by mouth  daily as needed for heartburn.    . spironolactone (ALDACTONE) 25 MG tablet Take 25 mg by mouth daily.     . traZODone (DESYREL) 50 MG tablet Take 50 mg by mouth at bedtime as needed for sleep.     [name] gabapentin (NEURONTIN) 800 MG tablet Take 800 mg by mouth 4 (four) times daily.      No current facility-administered medications on file prior to visit.     There are no Patient Instructions on file for this visit. No follow-ups on file.   Kris Hartmann, NP  This note was completed with Sales executive.  Any errors are purely unintentional.

## 2018-09-14 ENCOUNTER — Encounter (INDEPENDENT_AMBULATORY_CARE_PROVIDER_SITE_OTHER): Payer: Self-pay | Admitting: Nurse Practitioner

## 2018-09-17 ENCOUNTER — Ambulatory Visit (INDEPENDENT_AMBULATORY_CARE_PROVIDER_SITE_OTHER): Payer: Medicare Other | Admitting: Vascular Surgery

## 2018-09-17 ENCOUNTER — Encounter (INDEPENDENT_AMBULATORY_CARE_PROVIDER_SITE_OTHER): Payer: Self-pay

## 2018-09-17 ENCOUNTER — Other Ambulatory Visit: Payer: Self-pay

## 2018-09-17 VITALS — BP 122/75 | HR 99 | Resp 16

## 2018-09-17 DIAGNOSIS — I83029 Varicose veins of left lower extremity with ulcer of unspecified site: Secondary | ICD-10-CM | POA: Diagnosis not present

## 2018-09-17 DIAGNOSIS — L97929 Non-pressure chronic ulcer of unspecified part of left lower leg with unspecified severity: Secondary | ICD-10-CM | POA: Diagnosis not present

## 2018-09-17 NOTE — Progress Notes (Signed)
History of Present Illness  Patient presents with persistence but improvement in the venous ulcer.  Assessments & Plan   There are no diagnoses linked to this encounter.    Additional instructions  Subjective:  Patient presents with venous ulcer of the Left lower extremity.    Procedure:  3 layer unna wrap was placed Left lower extremity.  This was performed today here in the office while I supervised   Plan:   Follow up in one week.

## 2018-09-21 ENCOUNTER — Encounter (INDEPENDENT_AMBULATORY_CARE_PROVIDER_SITE_OTHER): Payer: Medicare Other

## 2018-09-21 ENCOUNTER — Ambulatory Visit (INDEPENDENT_AMBULATORY_CARE_PROVIDER_SITE_OTHER): Payer: Medicare Other | Admitting: Nurse Practitioner

## 2018-09-24 ENCOUNTER — Ambulatory Visit (INDEPENDENT_AMBULATORY_CARE_PROVIDER_SITE_OTHER): Payer: Medicare Other | Admitting: Nurse Practitioner

## 2018-09-24 ENCOUNTER — Encounter (INDEPENDENT_AMBULATORY_CARE_PROVIDER_SITE_OTHER): Payer: Self-pay

## 2018-09-24 ENCOUNTER — Other Ambulatory Visit: Payer: Self-pay

## 2018-09-24 VITALS — BP 124/71 | HR 103 | Resp 16

## 2018-09-24 DIAGNOSIS — L97929 Non-pressure chronic ulcer of unspecified part of left lower leg with unspecified severity: Secondary | ICD-10-CM | POA: Diagnosis not present

## 2018-09-24 DIAGNOSIS — I83029 Varicose veins of left lower extremity with ulcer of unspecified site: Secondary | ICD-10-CM

## 2018-09-24 NOTE — Progress Notes (Signed)
History of Present Illness  There is no documented history at this time  Assessments & Plan   There are no diagnoses linked to this encounter.    Additional instructions  Subjective:  Patient presents with venous ulcer of the Left lower extremity.    Procedure:  3 layer unna wrap was placed Left lower extremity.   Plan:   Follow up in one week.  

## 2018-10-01 ENCOUNTER — Other Ambulatory Visit: Payer: Self-pay

## 2018-10-01 ENCOUNTER — Encounter (INDEPENDENT_AMBULATORY_CARE_PROVIDER_SITE_OTHER): Payer: Self-pay | Admitting: Nurse Practitioner

## 2018-10-01 ENCOUNTER — Ambulatory Visit (INDEPENDENT_AMBULATORY_CARE_PROVIDER_SITE_OTHER): Payer: Medicare Other | Admitting: Nurse Practitioner

## 2018-10-01 VITALS — BP 115/75 | HR 66 | Resp 17

## 2018-10-01 DIAGNOSIS — L97929 Non-pressure chronic ulcer of unspecified part of left lower leg with unspecified severity: Secondary | ICD-10-CM | POA: Diagnosis not present

## 2018-10-01 DIAGNOSIS — E118 Type 2 diabetes mellitus with unspecified complications: Secondary | ICD-10-CM

## 2018-10-01 DIAGNOSIS — F17210 Nicotine dependence, cigarettes, uncomplicated: Secondary | ICD-10-CM

## 2018-10-01 DIAGNOSIS — S91109A Unspecified open wound of unspecified toe(s) without damage to nail, initial encounter: Secondary | ICD-10-CM | POA: Insufficient documentation

## 2018-10-01 DIAGNOSIS — S91109D Unspecified open wound of unspecified toe(s) without damage to nail, subsequent encounter: Secondary | ICD-10-CM

## 2018-10-01 DIAGNOSIS — J449 Chronic obstructive pulmonary disease, unspecified: Secondary | ICD-10-CM | POA: Diagnosis not present

## 2018-10-01 DIAGNOSIS — I83029 Varicose veins of left lower extremity with ulcer of unspecified site: Secondary | ICD-10-CM | POA: Diagnosis not present

## 2018-10-01 DIAGNOSIS — Z79899 Other long term (current) drug therapy: Secondary | ICD-10-CM

## 2018-10-01 DIAGNOSIS — Z794 Long term (current) use of insulin: Secondary | ICD-10-CM

## 2018-10-01 NOTE — Progress Notes (Signed)
SUBJECTIVE:  Patient ID: [name] [name], male    DOB: [birth year], 58 y.o.   MRN: [id number] Chief Complaint  Patient presents with  . Follow-up    unna boot check    HPI  [name] [name] is a 58 y.o. male that presents today for evaluation of his left lower extremity venous ulceration.  Today the ulceration has resolved.  However he continues to have some wounds on his toes.  The wound on his second digit actually appears a little bit worse than previous.  The patient recently underwent a left lower extremity angiogram and his most recent noninvasive study showed adequate blood flow.  The patient denies any fever, chills, nausea, vomiting or diarrhea.  He denies any chest pain or shortness of breath.  He denies any TIA-like symptoms.  Past Medical History:  Diagnosis Date  . Arthritis   . Asthma   . Atherosclerosis   . BPH (benign prostatic hyperplasia)   . Carotid arterial disease ([place])   . Charcot's joint of foot, right   . Chronic combined systolic (congestive) and diastolic (congestive) heart failure ([place])    a. 02/2013 EF 50% by LV gram; b. 04/2017 Echo: EF 25-30%. diff HK. Gr2 DD. Mod dil LA/RV. PASP 80mmHg.  [name] COPD (chronic obstructive pulmonary disease) ([place])   . Coronary artery disease    a. 2013 S/P PCI of LAD [facility]);  b. 03/2013 PCI: RCA 90p ( 2.5 x 23 mm DES); c. 02/2014 Cath: patent RCA stent->Med Rx; d. 04/2017 Cath: LM nl, LAd 30ost/p, 4m/d, D1 80ost, LCX 90p/m, OM1 85, RCA 70/20p, 51m, 70d->Referred for CT Surg-felt to be poor candidate.  . Diabetic neuropathy ([place])   . Diabetic ulcer of right foot ([place])   . Hernia   . Hyperlipidemia   . Hypertension   . Ischemic cardiomyopathy    a. 04/2017 Echo: EF 25-30%; b. 08/2017 Cardiac MRI ([facility]): EF 19%, sev glob HK. RVEF 20%, mod BAE, triv MR, mild-mod TR. Basal lateral subendocardial infarct (viable), inf/infsept, dist septal ischemia, basal to mid lat peri-infarct ischemia.  . Morbid obesity ([place])   . Neuropathy   . PAD  (peripheral artery disease) ([place])    a. Followed by Dr. Lucky Cowboy; b. 08/2010 Periph Angio: RSFA 70-80p (6X60 self-expanding stent); c. 09/2010 Periph Angio: L SFA 100p (7x unknown length self-expanding stent); d. 06/2015 Periph Angio: R SFA short segment occlusion (6x12 self-expanding stent).  . Restless leg syndrome   . Sleep apnea   . Subclavian artery stenosis, right ([place])   . Syncope and collapse   . Tobacco use    a. 75+ yr hx - still smoking 1ppd, down from 2 ppd.  . Type II diabetes mellitus ([place])   . Varicose vein     Past Surgical History:  Procedure Laterality Date  . ABDOMINAL AORTAGRAM N/A 06/23/2013   Procedure: ABDOMINAL Maxcine Ham;  Surgeon: Wellington Hampshire, MD;  Location: [facility];  Service: Cardiovascular;  Laterality: N/A;  . AMPUTATION Right 04/01/2018   Procedure: AMPUTATION BELOW KNEE;  Surgeon: Algernon Huxley, MD;  Location: [facility] ORS;  Service: Vascular;  Laterality: Right;  . APPENDECTOMY    . CARDIAC CATHETERIZATION  10/14   [facility]; X1 STENT PROXIMAL RCA  . CARDIAC CATHETERIZATION  02/2010   [facility]  . CARDIAC CATHETERIZATION  02/21/2014   [facility]  . CLAVICLE SURGERY    . HERNIA REPAIR    . IRRIGATION AND DEBRIDEMENT FOOT Right 01/04/2018   Procedure: IRRIGATION AND DEBRIDEMENT FOOT;  Surgeon: Samara Deist, DPM;  Location: [facility] ORS;  Service: Podiatry;  Laterality: Right;  . LEFT HEART CATH AND CORS/GRAFTS ANGIOGRAPHY N/A 04/28/2017   Procedure: LEFT HEART CATH AND CORONARY ANGIOGRAPHY;  Surgeon: Wellington Hampshire, MD;  Location: [place] [facility];  Service: Cardiovascular;  Laterality: N/A;  . LOWER EXTREMITY ANGIOGRAPHY Right 03/03/2018   Procedure: LOWER EXTREMITY ANGIOGRAPHY;  Surgeon: Katha Cabal, MD;  Location: [place] [facility];  Service: Cardiovascular;  Laterality: Right;  . LOWER EXTREMITY ANGIOGRAPHY Left 08/24/2018   Procedure: LOWER EXTREMITY ANGIOGRAPHY;  Surgeon: Algernon Huxley, MD;  Location: [place] [facility];  Service: Cardiovascular;   Laterality: Left;  . PERIPHERAL ARTERIAL STENT GRAFT     x2 left/right  . PERIPHERAL VASCULAR BALLOON ANGIOPLASTY Right 01/06/2018   Procedure: PERIPHERAL VASCULAR BALLOON ANGIOPLASTY;  Surgeon: Katha Cabal, MD;  Location: [place] [facility];  Service: Cardiovascular;  Laterality: Right;  . PERIPHERAL VASCULAR CATHETERIZATION N/A 07/06/2015   Procedure: Abdominal Aortogram w/Lower Extremity;  Surgeon: Algernon Huxley, MD;  Location: [place] [facility];  Service: Cardiovascular;  Laterality: N/A;  . PERIPHERAL VASCULAR CATHETERIZATION  07/06/2015   Procedure: Lower Extremity Intervention;  Surgeon: Algernon Huxley, MD;  Location: [place] [facility];  Service: Cardiovascular;;    Social History   Socioeconomic History  . Marital status: Single    Spouse name: Not on file  . Number of children: 0  . Years of education: 14  . Highest education level: 12th grade  Occupational History  . Occupation: on disability  Social Needs  . Financial resource strain: Not hard at all  . Food insecurity:    Worry: Never true    Inability: Never true  . Transportation needs:    Medical: No    Non-medical: No  Tobacco Use  . Smoking status: Current Every Day Smoker    Packs/day: 1.00    Years: 41.00    Pack years: 41.00    Types: Cigarettes  . Smokeless tobacco: Never Used  Substance and Sexual Activity  . Alcohol use: No  . Drug use: No  . Sexual activity: Yes  Lifestyle  . Physical activity:    Days per week: 7 days    Minutes per session: 30 min  . Stress: Rather much  Relationships  . Social connections:    Talks on phone: More than three times a week    Gets together: Once a week    Attends religious service: Never    Active member of club or organization: No    Attends meetings of clubs or organizations: Never    Relationship status: Living with partner  . Intimate partner violence:    Fear of current or ex partner: No    Emotionally abused: No    Physically abused: No     Forced sexual activity: No  Other Topics Concern  . Not on file  Social History Narrative   Lives at home in [place] with girlfriend. Independent at baseline.    Family History  Problem Relation Age of Onset  . Heart attack Father   . Heart disease Father   . Hypertension Father   . Hyperlipidemia Father   . Hypertension Mother   . Hyperlipidemia Mother     Allergies  Allergen Reactions  . Dulaglutide Anaphylaxis, Diarrhea and Hives  . Prednisone Rash     Review of Systems   Review of Systems: Negative Unless Checked Constitutional: [] Weight loss  [] Fever  [] Chills  Cardiac: [] Chest pain   []  Atrial Fibrillation  [] Palpitations   [] Shortness of breath when laying flat   [] Shortness of breath with exertion. [] Shortness of breath at rest Vascular:  [] Pain in legs with walking   [] Pain in legs with standing [] Pain in legs when laying flat   [] Claudication    [] Pain in feet when laying flat    [] History of DVT   [] Phlebitis   [x] Swelling in legs   [] Varicose veins   [] Non-healing ulcers Pulmonary:   [] Uses home oxygen   [] Productive cough   [] Hemoptysis   [] Wheeze  [x] COPD   [] Asthma Neurologic:  [] Dizziness   [] Seizures  [] Blackouts [] History of stroke   [] History of TIA  [] Aphasia   [] Temporary Blindness   [] Weakness or numbness in arm   [] Weakness or numbness in leg Musculoskeletal:   [] Joint swelling   [] Joint pain   [] Low back pain  []  History of Knee Replacement [] Arthritis [] back Surgeries  []  Spinal Stenosis    Hematologic:  [] Easy bruising  [] Easy bleeding   [] Hypercoagulable state   [] Anemic Gastrointestinal:  [] Diarrhea   [] Vomiting  [] Gastroesophageal reflux/heartburn   [] Difficulty swallowing. [] Abdominal pain Genitourinary:  [] Chronic kidney disease   [] Difficult urination  [] Anuric   [] Blood in urine [] Frequent urination  [] Burning with urination   [] Hematuria Skin:  [] Rashes   [] Ulcers [x] Wounds Psychological:  [] History of anxiety   []  History of major depression   []  Memory Difficulties      OBJECTIVE:   Physical Exam  BP 115/75 (BP Location: Right Arm)   Pulse 66   Resp 17   Gen: WD/WN, NAD Head: [place]/AT, No temporalis wasting.  Ear/Nose/Throat: Hearing grossly intact, nares w/o erythema or drainage Eyes: PER, EOMI, sclera nonicteric.  Neck: Supple, no masses.  No JVD.  Pulmonary:  Good air movement, no use of accessory muscles.  Cardiac: RRR Vascular: Small wounds on 2nd, third and fourth toes of left foot Vessel Right Left  Radial Palpable Palpable  Dorsalis Pedis  Trace Palpable  Posterior Tibial  Trace Palpable   Gastrointestinal: soft, non-distended. No guarding/no peritoneal signs.  Musculoskeletal: M/S 5/5 throughout.  No deformity or atrophy.  Neurologic: Pain and light touch intact in extremities.  Symmetrical.  Speech is fluent. Motor exam as listed above. Psychiatric: Judgment intact, Mood & affect appropriate for pt's clinical situation. Dermatologic: No changes consistent with cellulitis. Lymph : No Cervical lymphadenopathy, no lichenification or skin changes of chronic lymphedema.       ASSESSMENT AND PLAN:  1. Venous ulcer of left leg ([place]) This venous ulcer is completely healed.  We will stop doing Unna wraps today.  Patient is instructed to utilize medical grade 1 compression stockings on a daily basis.  He was specifically instructed to place them first thing in the morning and to remove them at night.  He was also instructed that he should not sleep in them.  Elevation of his lower extremities when not ambulating was also stressed.  Exercise was emphasized.  2. Open wound of toe, subsequent encounter Previous ABIs done approximately a month ago showed he should have adequate blood flow for wound healing.  However, given the patient's history of smoking as well as comorbidities I feel is prudent for [place] to recheck ABIs with a lower extremity arterial duplex to ensure no further interventions necessary at this time.  In  the meantime I have asked him to reach out to Dr. Vickki Muff, his podiatrist so that he may evaluate these wounds to  determine if debridement or other wound care is needed.  Patient will follow-up in 2 to 3 weeks for noninvasive studies. - VAS [place] ABI WITH/WO TBI; Future - VAS [place] LOWER EXTREMITY ARTERIAL DUPLEX; Future  3. Chronic obstructive pulmonary disease, unspecified COPD type ([place]) Continue PPI as already ordered, this medication has been reviewed and there are no changes at this time.  Avoidence of caffeine and alcohol  Moderate elevation of the head of the bed   4. Type 2 diabetes mellitus with complication, with long-term current use of insulin ([facility]) Continue hypoglycemic medications as already ordered, these medications have been reviewed and there are no changes at this time.  Hgb A1C to be monitored as already arranged by primary service     Current Outpatient Medications on File Prior to Visit  Medication Sig Dispense Refill  . aspirin EC 81 MG tablet Take 1 tablet (81 mg total) by mouth daily. 90 tablet 3  . atorvastatin (LIPITOR) 80 MG tablet Take 80 mg by mouth daily.    . calcium carbonate (OS-CAL) 1250 (500 Ca) MG chewable tablet Chew by mouth.    . calcium carbonate (TUMS - DOSED IN MG ELEMENTAL CALCIUM) 500 MG chewable tablet Chew 4 tablets by mouth daily as needed for indigestion or heartburn.    . clopidogrel (PLAVIX) 75 MG tablet Take 75 mg by mouth daily.     . diphenhydrAMINE (BENADRYL) 25 MG tablet Take 25 mg by mouth daily as needed for allergies.    [name] doxycycline (VIBRAMYCIN) 100 MG capsule Take 1 capsule (100 mg total) by mouth 2 (two) times daily. 28 capsule 0  . ezetimibe (ZETIA) 10 MG tablet Take 10 mg by mouth daily.    . fluticasone furoate-vilanterol (BREO ELLIPTA) 100-25 MCG/INH AEPB Inhale 1 puff into the lungs daily.     . furosemide (LASIX) 40 MG tablet Take 40 mg by mouth daily. May take twice daily only if needed    . Insulin Glargine (LANTUS  SOLOSTAR) 100 UNIT/ML Solostar Pen Inject 30 Units into the skin at bedtime. 15 mL 11  . insulin lispro (HUMALOG) 100 UNIT/ML injection Sliding Scale Insulin before meals using Novolog If BS is 0 to 70 no insulin and eat/glucose tablets/glucose get If BS 71 to 150 great! If BS 151 to 200, 2 Units Novolog If BS 201 to 250, 4 Units Novolog If BS 251 to 300, 6 Units Novolog If BS 301 to 350, 8 Units Novolog If BS 351 to 400, 10 Units Novolog If BS greater than 400, take 10 Units and Contact our office during office hour    . ipratropium-albuterol (DUONEB) 0.5-2.5 (3) MG/3ML SOLN Inhale 3 mLs into the lungs every 6 (six) hours as needed (wheezing/sob).     . magnesium oxide (MAG-OX) 400 MG tablet Take 400 mg by mouth daily.     . metFORMIN (GLUCOPHAGE-XR) 500 MG 24 hr tablet Take 500 mg by mouth 2 (two) times daily.     . metoprolol succinate (TOPROL-XL) 25 MG 24 hr tablet Take 25 mg by mouth daily.     . Multiple Vitamin (MULTI-VITAMINS) TABS Take 1 tablet by mouth daily.     . multivitamin-lutein (OCUVITE-LUTEIN) CAPS capsule Take 1 capsule by mouth daily. 30 capsule 0  . nitroGLYCERIN (NITROSTAT) 0.4 MG SL tablet Place 0.4 mg under the tongue every 5 (five) minutes as needed for chest pain.    . Oxycodone HCl 10 MG TABS Take 10 mg by mouth every 6 (six) hours as  needed (pain).     . potassium chloride (K-DUR) 10 MEQ tablet Take 10 mEq by mouth daily.     . promethazine (PHENERGAN) 25 MG tablet TAKE 1 TABLET BY MOUTH EVERY 6 HOURS AS NEEDED NAUSEA    . ranitidine (ZANTAC) 150 MG tablet Take 300 mg by mouth daily as needed for heartburn.    . spironolactone (ALDACTONE) 25 MG tablet Take 25 mg by mouth daily.     . traZODone (DESYREL) 50 MG tablet Take 50 mg by mouth at bedtime as needed for sleep.     [name] gabapentin (NEURONTIN) 800 MG tablet Take 800 mg by mouth 4 (four) times daily.      No current facility-administered medications on file prior to visit.     There are no Patient Instructions on file  for this visit. No follow-ups on file.   Kris Hartmann, NP  This note was completed with Sales executive.  Any errors are purely unintentional.

## 2018-10-15 ENCOUNTER — Other Ambulatory Visit: Payer: Self-pay

## 2018-10-15 ENCOUNTER — Encounter (INDEPENDENT_AMBULATORY_CARE_PROVIDER_SITE_OTHER): Payer: Self-pay | Admitting: Nurse Practitioner

## 2018-10-15 ENCOUNTER — Ambulatory Visit (INDEPENDENT_AMBULATORY_CARE_PROVIDER_SITE_OTHER): Payer: Medicare Other | Admitting: Nurse Practitioner

## 2018-10-15 ENCOUNTER — Ambulatory Visit (INDEPENDENT_AMBULATORY_CARE_PROVIDER_SITE_OTHER): Payer: Medicare Other

## 2018-10-15 VITALS — BP 119/85 | HR 112 | Resp 12 | Ht 67.0 in | Wt 230.0 lb

## 2018-10-15 DIAGNOSIS — Z95820 Peripheral vascular angioplasty status with implants and grafts: Secondary | ICD-10-CM | POA: Diagnosis not present

## 2018-10-15 DIAGNOSIS — J449 Chronic obstructive pulmonary disease, unspecified: Secondary | ICD-10-CM

## 2018-10-15 DIAGNOSIS — L97529 Non-pressure chronic ulcer of other part of left foot with unspecified severity: Secondary | ICD-10-CM

## 2018-10-15 DIAGNOSIS — I739 Peripheral vascular disease, unspecified: Secondary | ICD-10-CM

## 2018-10-15 DIAGNOSIS — S91109D Unspecified open wound of unspecified toe(s) without damage to nail, subsequent encounter: Secondary | ICD-10-CM

## 2018-10-15 DIAGNOSIS — E782 Mixed hyperlipidemia: Secondary | ICD-10-CM | POA: Diagnosis not present

## 2018-10-15 DIAGNOSIS — I10 Essential (primary) hypertension: Secondary | ICD-10-CM | POA: Diagnosis not present

## 2018-10-15 DIAGNOSIS — N186 End stage renal disease: Secondary | ICD-10-CM

## 2018-10-15 DIAGNOSIS — F17210 Nicotine dependence, cigarettes, uncomplicated: Secondary | ICD-10-CM

## 2018-10-15 DIAGNOSIS — Z89511 Acquired absence of right leg below knee: Secondary | ICD-10-CM

## 2018-10-15 DIAGNOSIS — Z79899 Other long term (current) drug therapy: Secondary | ICD-10-CM

## 2018-10-19 ENCOUNTER — Encounter (INDEPENDENT_AMBULATORY_CARE_PROVIDER_SITE_OTHER): Payer: Self-pay | Admitting: Nurse Practitioner

## 2018-10-19 NOTE — Progress Notes (Signed)
SUBJECTIVE:  Patient ID: [name] [name], male    DOB: 14-Apr-1961, 58 y.o.   MRN: [id number] Chief Complaint  Patient presents with  . Follow-up    HPI  [name] [name] is a 58 y.o. male that presents today for follow-up testing due to wounds found on his second third and fourth left toes.  The patient has a previous history of a right below-knee amputation.  Currently the patient is doing well with his prosthesis.  Recently the patient had issues with swelling and cellulitis of his left lower extremity, where it was noted that he had some wounds on his second third and fourth toes.  These appear very superficial.  However, they are slow to heal, so we had the patient follow-up with noninvasive studies today.  Patient underwent ABIs today which revealed an ABI of 0.93 on his left lower extremity.  He had biphasic waveforms within his anterior tibial artery and triphasic within his posterior tibial artery.  He had strong toe waveforms.  He also underwent a lower extremity arterial duplex which revealed triphasic flow down to the level of the tibial arteries.  His current stent in his left lower extremity is patent.  Past Medical History:  Diagnosis Date  . Arthritis   . Asthma   . Atherosclerosis   . BPH (benign prostatic hyperplasia)   . Carotid arterial disease ([place])   . Charcot's joint of foot, right   . Chronic combined systolic (congestive) and diastolic (congestive) heart failure ([place])    a. 02/2013 EF 50% by LV gram; b. 04/2017 Echo: EF 25-30%. diff HK. Gr2 DD. Mod dil LA/RV. PASP 66mmHg.  [name] COPD (chronic obstructive pulmonary disease) ([place])   . Coronary artery disease    a. 2013 S/P PCI of LAD [facility]);  b. 03/2013 PCI: RCA 90p ( 2.5 x 23 mm DES); c. 02/2014 Cath: patent RCA stent->Med Rx; d. 04/2017 Cath: LM nl, LAd 30ost/p, 39m/d, D1 80ost, LCX 90p/m, OM1 85, RCA 70/20p, 54m, 70d->Referred for CT Surg-felt to be poor candidate.  . Diabetic neuropathy ([place])   . Diabetic ulcer of right  foot ([place])   . Hernia   . Hyperlipidemia   . Hypertension   . Ischemic cardiomyopathy    a. 04/2017 Echo: EF 25-30%; b. 08/2017 Cardiac MRI ([facility]): EF 19%, sev glob HK. RVEF 20%, mod BAE, triv MR, mild-mod TR. Basal lateral subendocardial infarct (viable), inf/infsept, dist septal ischemia, basal to mid lat peri-infarct ischemia.  . Morbid obesity ([place])   . Neuropathy   . PAD (peripheral artery disease) ([place])    a. Followed by Dr. Lucky Cowboy; b. 08/2010 Periph Angio: RSFA 70-80p (6X60 self-expanding stent); c. 09/2010 Periph Angio: L SFA 100p (7x unknown length self-expanding stent); d. 06/2015 Periph Angio: R SFA short segment occlusion (6x12 self-expanding stent).  . Restless leg syndrome   . Sleep apnea   . Subclavian artery stenosis, right ([place])   . Syncope and collapse   . Tobacco use    a. 75+ yr hx - still smoking 1ppd, down from 2 ppd.  . Type II diabetes mellitus ([place])   . Varicose vein     Past Surgical History:  Procedure Laterality Date  . ABDOMINAL AORTAGRAM N/A 06/23/2013   Procedure: ABDOMINAL Maxcine Ham;  Surgeon: Wellington Hampshire, MD;  Location: [place] [facility];  Service: Cardiovascular;  Laterality: N/A;  . AMPUTATION Right 04/01/2018   Procedure: AMPUTATION BELOW KNEE;  Surgeon: Algernon Huxley, MD;  Location: [facility] ORS;  Service:  Vascular;  Laterality: Right;  . APPENDECTOMY    . CARDIAC CATHETERIZATION  10/14   [facility]; X1 STENT PROXIMAL RCA  . CARDIAC CATHETERIZATION  02/2010   [facility]  . CARDIAC CATHETERIZATION  02/21/2014   [facility]  . CLAVICLE SURGERY    . HERNIA REPAIR    . IRRIGATION AND DEBRIDEMENT FOOT Right 01/04/2018   Procedure: IRRIGATION AND DEBRIDEMENT FOOT;  Surgeon: Samara Deist, DPM;  Location: [facility] OR[facility];  Service: Podiatry;  Laterality: Right;  . LEFT HEART CATH AND CORS/GRAFTS ANGIOGRAPHY N/A 04/28/2017   Procedure: LEFT HEART CATH AND CORONARY ANGIOGRAPHY;  Surgeon: Wellington Hampshire, MD;  Location: [place] [facility];  Service: Cardiovascular;  Laterality: N/A;  .  LOWER EXTREMITY ANGIOGRAPHY Right 03/03/2018   Procedure: LOWER EXTREMITY ANGIOGRAPHY;  Surgeon: Katha Cabal, MD;  Location: [place] [facility];  Service: Cardiovascular;  Laterality: Right;  . LOWER EXTREMITY ANGIOGRAPHY Left 08/24/2018   Procedure: LOWER EXTREMITY ANGIOGRAPHY;  Surgeon: Algernon Huxley, MD;  Location: [place] [facility];  Service: Cardiovascular;  Laterality: Left;  . PERIPHERAL ARTERIAL STENT GRAFT     x2 left/right  . PERIPHERAL VASCULAR BALLOON ANGIOPLASTY Right 01/06/2018   Procedure: PERIPHERAL VASCULAR BALLOON ANGIOPLASTY;  Surgeon: Katha Cabal, MD;  Location: [place] [facility];  Service: Cardiovascular;  Laterality: Right;  . PERIPHERAL VASCULAR CATHETERIZATION N/A 07/06/2015   Procedure: Abdominal Aortogram w/Lower Extremity;  Surgeon: Algernon Huxley, MD;  Location: [place] [facility];  Service: Cardiovascular;  Laterality: N/A;  . PERIPHERAL VASCULAR CATHETERIZATION  07/06/2015   Procedure: Lower Extremity Intervention;  Surgeon: Algernon Huxley, MD;  Location: [place] [facility];  Service: Cardiovascular;;    Social History   Socioeconomic History  . Marital status: Single    Spouse name: Not on file  . Number of children: 0  . Years of education: 81  . Highest education level: 12th grade  Occupational History  . Occupation: on disability  Social Needs  . Financial resource strain: Not hard at all  . Food insecurity:    Worry: Never true    Inability: Never true  . Transportation needs:    Medical: No    Non-medical: No  Tobacco Use  . Smoking status: Current Every Day Smoker    Packs/day: 1.00    Years: 41.00    Pack years: 41.00    Types: Cigarettes  . Smokeless tobacco: Never Used  Substance and Sexual Activity  . Alcohol use: No  . Drug use: No  . Sexual activity: Yes  Lifestyle  . Physical activity:    Days per week: 7 days    Minutes per session: 30 min  . Stress: Rather much  Relationships  . Social connections:    Talks  on phone: More than three times a week    Gets together: Once a week    Attends religious service: Never    Active member of club or organization: No    Attends meetings of clubs or organizations: Never    Relationship status: Living with partner  . Intimate partner violence:    Fear of current or ex partner: No    Emotionally abused: No    Physically abused: No    Forced sexual activity: No  Other Topics Concern  . Not on file  Social History Narrative   Lives at home in [place] with girlfriend. Independent at baseline.    Family History  Problem Relation Age of Onset  . Heart attack  Father   . Heart disease Father   . Hypertension Father   . Hyperlipidemia Father   . Hypertension Mother   . Hyperlipidemia Mother     Allergies  Allergen Reactions  . Dulaglutide Anaphylaxis, Diarrhea and Hives  . Prednisone Rash     Review of Systems   Review of Systems: Negative Unless Checked Constitutional: [] Weight loss  [] Fever  [] Chills Cardiac: [] Chest pain   []  Atrial Fibrillation  [] Palpitations   [] Shortness of breath when laying flat   [] Shortness of breath with exertion. [] Shortness of breath at rest Vascular:  [] Pain in legs with walking   [] Pain in legs with standing [] Pain in legs when laying flat   [] Claudication    [] Pain in feet when laying flat    [] History of DVT   [] Phlebitis   [x] Swelling in legs   [] Varicose veins   [] Non-healing ulcers Pulmonary:   [] Uses home oxygen   [] Productive cough   [] Hemoptysis   [] Wheeze  [x] COPD   [] Asthma Neurologic:  [] Dizziness   [] Seizures  [] Blackouts [] History of stroke   [] History of TIA  [] Aphasia   [] Temporary Blindness   [] Weakness or numbness in arm   [] Weakness or numbness in leg Musculoskeletal:   [] Joint swelling   [] Joint pain   [] Low back pain  []  History of Knee Replacement [] Arthritis [] back Surgeries  []  Spinal Stenosis    Hematologic:  [] Easy bruising  [] Easy bleeding   [] Hypercoagulable state   [] Anemic  Gastrointestinal:  [] Diarrhea   [] Vomiting  [] Gastroesophageal reflux/heartburn   [] Difficulty swallowing. [] Abdominal pain Genitourinary:  [] Chronic kidney disease   [] Difficult urination  [] Anuric   [] Blood in urine [] Frequent urination  [] Burning with urination   [] Hematuria Skin:  [] Rashes   [] Ulcers [x] Wounds Psychological:  [] History of anxiety   []  History of major depression  []  Memory Difficulties      OBJECTIVE:   Physical Exam  BP 119/85 (BP Location: Right Arm, Patient Position: Sitting, Cuff Size: Large)   Pulse (!) 112   Resp 12   Ht 5\' 7"  (1.702 m)   Wt 230 lb (104.3 kg)   BMI 36.02 kg/m   Gen: WD/WN, NAD Head: /AT, No temporalis wasting.  Ear/Nose/Throat: Hearing grossly intact, nares w/o erythema or drainage Eyes: PER, EOMI, sclera nonicteric.  Neck: Supple, no masses.  No JVD.  Pulmonary:  Good air movement, no use of accessory muscles.  Cardiac: RRR Vascular:  Small wounds on second third and fourth toes of left toe Vessel Right Left  Radial Palpable Palpable  Dorsalis Pedis  Palpable  Posterior Tibial  Palpable   Gastrointestinal: soft, non-distended. No guarding/no peritoneal signs.  Musculoskeletal: M/S 5/5 throughout.  No deformity or atrophy.  Neurologic: Pain and light touch intact in extremities.  Symmetrical.  Speech is fluent. Motor exam as listed above. Psychiatric: Judgment intact, Mood & affect appropriate for pt's clinical situation. Dermatologic: No Venous rashes. No Ulcers Noted.  No changes consistent with cellulitis. Lymph : No Cervical lymphadenopathy, no lichenification or skin changes of chronic lymphedema.       ASSESSMENT AND PLAN:  1. PAD (peripheral artery disease) ([place]) Patient currently has adequate blood flow for wound healing.  I have advised the patient to follow-up with his podiatrist to follow to ensure that there is no worsening of his wounds.  The patient will reach out and contact his podiatrist.  Otherwise, we will  have the patient follow-up in the office in 6 months for noninvasive studies.  The patient  is advised to contact our office earlier if he notices that his wounds are becoming worse, there is discoloration to his toes, or if there is foul-smelling purulent drainage. - VAS [place] ABI WITH/WO TBI; Future - VAS [place] LOWER EXTREMITY ARTERIAL DUPLEX; Future  2. Chronic obstructive pulmonary disease, unspecified COPD type ([place]) Continue pulmonary medications and aerosols as already ordered, these medications have been reviewed and there are no changes at this time.    3. Essential hypertension Continue antihypertensive medications as already ordered, these medications have been reviewed and there are no changes at this time.   4. Mixed hyperlipidemia Continue statin as ordered and reviewed, no changes at this time    Current Outpatient Medications on File Prior to Visit  Medication Sig Dispense Refill  . aspirin EC 81 MG tablet Take 1 tablet (81 mg total) by mouth daily. 90 tablet 3  . atorvastatin (LIPITOR) 80 MG tablet Take 80 mg by mouth daily.    . calcium carbonate (OS-CAL) 1250 (500 Ca) MG chewable tablet Chew by mouth.    . calcium carbonate (TUMS - DOSED IN MG ELEMENTAL CALCIUM) 500 MG chewable tablet Chew 4 tablets by mouth daily as needed for indigestion or heartburn.    . clopidogrel (PLAVIX) 75 MG tablet Take 75 mg by mouth daily.     . diphenhydrAMINE (BENADRYL) 25 MG tablet Take 25 mg by mouth daily as needed for allergies.    [name] doxycycline (VIBRAMYCIN) 100 MG capsule Take 1 capsule (100 mg total) by mouth 2 (two) times daily. 28 capsule 0  . ezetimibe (ZETIA) 10 MG tablet Take 10 mg by mouth daily.    . fluticasone furoate-vilanterol (BREO ELLIPTA) 100-25 MCG/INH AEPB Inhale 1 puff into the lungs daily.     . furosemide (LASIX) 40 MG tablet Take 40 mg by mouth daily. May take twice daily only if needed    . gabapentin (NEURONTIN) 800 MG tablet Take 800 mg by mouth 4 (four) times  daily.     . Insulin Glargine (LANTUS SOLOSTAR) 100 UNIT/ML Solostar Pen Inject 30 Units into the skin at bedtime. 15 mL 11  . insulin lispro (HUMALOG) 100 UNIT/ML injection Sliding Scale Insulin before meals using Novolog If BS is 0 to 70 no insulin and eat/glucose tablets/glucose get If BS 71 to 150 great! If BS 151 to 200, 2 Units Novolog If BS 201 to 250, 4 Units Novolog If BS 251 to 300, 6 Units Novolog If BS 301 to 350, 8 Units Novolog If BS 351 to 400, 10 Units Novolog If BS greater than 400, take 10 Units and Contact our office during office hour    . ipratropium-albuterol (DUONEB) 0.5-2.5 (3) MG/3ML SOLN Inhale 3 mLs into the lungs every 6 (six) hours as needed (wheezing/sob).     . magnesium oxide (MAG-OX) 400 MG tablet Take 400 mg by mouth daily.     . metFORMIN (GLUCOPHAGE-XR) 500 MG 24 hr tablet Take 500 mg by mouth 2 (two) times daily.     . metoprolol succinate (TOPROL-XL) 25 MG 24 hr tablet Take 25 mg by mouth daily.     . Multiple Vitamin (MULTI-VITAMINS) TABS Take 1 tablet by mouth daily.     . multivitamin-lutein (OCUVITE-LUTEIN) CAPS capsule Take 1 capsule by mouth daily. 30 capsule 0  . nitroGLYCERIN (NITROSTAT) 0.4 MG SL tablet Place 0.4 mg under the tongue every 5 (five) minutes as needed for chest pain.    . Oxycodone HCl 10 MG TABS Take 10  mg by mouth every 6 (six) hours as needed (pain).     . potassium chloride (K-DUR) 10 MEQ tablet Take 10 mEq by mouth daily.     . promethazine (PHENERGAN) 25 MG tablet TAKE 1 TABLET BY MOUTH EVERY 6 HOURS AS NEEDED NAUSEA    . ranitidine (ZANTAC) 150 MG tablet Take 300 mg by mouth daily as needed for heartburn.    . spironolactone (ALDACTONE) 25 MG tablet Take 25 mg by mouth daily.     . traZODone (DESYREL) 50 MG tablet Take 50 mg by mouth at bedtime as needed for sleep.      No current facility-administered medications on file prior to visit.     There are no Patient Instructions on file for this visit. No follow-ups on file.    Kris Hartmann, NP  This note was completed with Sales executive.  Any errors are purely unintentional.

## 2018-11-05 ENCOUNTER — Emergency Department: Payer: Medicare Other

## 2018-11-05 ENCOUNTER — Inpatient Hospital Stay
Admission: EM | Admit: 2018-11-05 | Discharge: 2018-11-09 | DRG: 292 | Disposition: A | Discharge status: Home Health | Payer: Medicare Other | Attending: Internal Medicine | Admitting: Internal Medicine

## 2018-11-05 ENCOUNTER — Encounter: Payer: Self-pay | Admitting: Emergency Medicine

## 2018-11-05 ENCOUNTER — Other Ambulatory Visit: Payer: Self-pay

## 2018-11-05 DIAGNOSIS — I5043 Acute on chronic combined systolic (congestive) and diastolic (congestive) heart failure: Secondary | ICD-10-CM | POA: Diagnosis present

## 2018-11-05 DIAGNOSIS — G2581 Restless legs syndrome: Secondary | ICD-10-CM | POA: Diagnosis present

## 2018-11-05 DIAGNOSIS — Z955 Presence of coronary angioplasty implant and graft: Secondary | ICD-10-CM

## 2018-11-05 DIAGNOSIS — I158 Other secondary hypertension: Secondary | ICD-10-CM | POA: Diagnosis present

## 2018-11-05 DIAGNOSIS — I5023 Acute on chronic systolic (congestive) heart failure: Secondary | ICD-10-CM | POA: Diagnosis present

## 2018-11-05 DIAGNOSIS — Z6836 Body mass index (BMI) 36.0-36.9, adult: Secondary | ICD-10-CM

## 2018-11-05 DIAGNOSIS — E871 Hypo-osmolality and hyponatremia: Secondary | ICD-10-CM | POA: Diagnosis not present

## 2018-11-05 DIAGNOSIS — Z888 Allergy status to other drugs, medicaments and biological substances status: Secondary | ICD-10-CM | POA: Diagnosis not present

## 2018-11-05 DIAGNOSIS — N400 Enlarged prostate without lower urinary tract symptoms: Secondary | ICD-10-CM | POA: Diagnosis present

## 2018-11-05 DIAGNOSIS — R0602 Shortness of breath: Secondary | ICD-10-CM

## 2018-11-05 DIAGNOSIS — I110 Hypertensive heart disease with heart failure: Secondary | ICD-10-CM | POA: Diagnosis present

## 2018-11-05 DIAGNOSIS — I25118 Atherosclerotic heart disease of native coronary artery with other forms of angina pectoris: Secondary | ICD-10-CM | POA: Diagnosis present

## 2018-11-05 DIAGNOSIS — Z79891 Long term (current) use of opiate analgesic: Secondary | ICD-10-CM

## 2018-11-05 DIAGNOSIS — F17210 Nicotine dependence, cigarettes, uncomplicated: Secondary | ICD-10-CM | POA: Diagnosis present

## 2018-11-05 DIAGNOSIS — E118 Type 2 diabetes mellitus with unspecified complications: Secondary | ICD-10-CM

## 2018-11-05 DIAGNOSIS — E1140 Type 2 diabetes mellitus with diabetic neuropathy, unspecified: Secondary | ICD-10-CM | POA: Diagnosis present

## 2018-11-05 DIAGNOSIS — Z7982 Long term (current) use of aspirin: Secondary | ICD-10-CM

## 2018-11-05 DIAGNOSIS — I509 Heart failure, unspecified: Secondary | ICD-10-CM

## 2018-11-05 DIAGNOSIS — Z8349 Family history of other endocrine, nutritional and metabolic diseases: Secondary | ICD-10-CM

## 2018-11-05 DIAGNOSIS — E785 Hyperlipidemia, unspecified: Secondary | ICD-10-CM | POA: Diagnosis present

## 2018-11-05 DIAGNOSIS — J449 Chronic obstructive pulmonary disease, unspecified: Secondary | ICD-10-CM | POA: Diagnosis present

## 2018-11-05 DIAGNOSIS — E11610 Type 2 diabetes mellitus with diabetic neuropathic arthropathy: Secondary | ICD-10-CM | POA: Diagnosis present

## 2018-11-05 DIAGNOSIS — I4510 Unspecified right bundle-branch block: Secondary | ICD-10-CM | POA: Diagnosis present

## 2018-11-05 DIAGNOSIS — Z79899 Other long term (current) drug therapy: Secondary | ICD-10-CM

## 2018-11-05 DIAGNOSIS — Z89511 Acquired absence of right leg below knee: Secondary | ICD-10-CM

## 2018-11-05 DIAGNOSIS — Z7902 Long term (current) use of antithrombotics/antiplatelets: Secondary | ICD-10-CM

## 2018-11-05 DIAGNOSIS — E1151 Type 2 diabetes mellitus with diabetic peripheral angiopathy without gangrene: Secondary | ICD-10-CM | POA: Diagnosis present

## 2018-11-05 DIAGNOSIS — I959 Hypotension, unspecified: Secondary | ICD-10-CM | POA: Diagnosis not present

## 2018-11-05 DIAGNOSIS — Z20828 Contact with and (suspected) exposure to other viral communicable diseases: Secondary | ICD-10-CM | POA: Diagnosis present

## 2018-11-05 DIAGNOSIS — I255 Ischemic cardiomyopathy: Secondary | ICD-10-CM | POA: Diagnosis present

## 2018-11-05 DIAGNOSIS — I361 Nonrheumatic tricuspid (valve) insufficiency: Secondary | ICD-10-CM | POA: Diagnosis not present

## 2018-11-05 DIAGNOSIS — I10 Essential (primary) hypertension: Secondary | ICD-10-CM | POA: Diagnosis present

## 2018-11-05 DIAGNOSIS — I272 Pulmonary hypertension, unspecified: Secondary | ICD-10-CM | POA: Diagnosis not present

## 2018-11-05 DIAGNOSIS — I252 Old myocardial infarction: Secondary | ICD-10-CM | POA: Diagnosis not present

## 2018-11-05 DIAGNOSIS — Z8249 Family history of ischemic heart disease and other diseases of the circulatory system: Secondary | ICD-10-CM

## 2018-11-05 DIAGNOSIS — J810 Acute pulmonary edema: Secondary | ICD-10-CM

## 2018-11-05 DIAGNOSIS — E1169 Type 2 diabetes mellitus with other specified complication: Secondary | ICD-10-CM

## 2018-11-05 DIAGNOSIS — Z794 Long term (current) use of insulin: Secondary | ICD-10-CM

## 2018-11-05 DIAGNOSIS — M7989 Other specified soft tissue disorders: Secondary | ICD-10-CM | POA: Diagnosis present

## 2018-11-05 LAB — CBC
HCT: 36.9 % — ABNORMAL LOW (ref 39.0–52.0)
Hemoglobin: 10.9 g/dL — ABNORMAL LOW (ref 13.0–17.0)
MCH: 19.3 pg — ABNORMAL LOW (ref 26.0–34.0)
MCHC: 29.5 g/dL — ABNORMAL LOW (ref 30.0–36.0)
MCV: 65.2 fL — ABNORMAL LOW (ref 80.0–100.0)
Platelets: 289 10*3/uL (ref 150–400)
RBC: 5.66 MIL/uL (ref 4.22–5.81)
RDW: 20.7 % — ABNORMAL HIGH (ref 11.5–15.5)
WBC: 7 10*3/uL (ref 4.0–10.5)
nRBC: 0 % (ref 0.0–0.2)

## 2018-11-05 LAB — BASIC METABOLIC PANEL
Anion gap: 10 (ref 5–15)
BUN: 14 mg/dL (ref 6–20)
CO2: 27 mmol/L (ref 22–32)
Calcium: 8.6 mg/dL — ABNORMAL LOW (ref 8.9–10.3)
Chloride: 95 mmol/L — ABNORMAL LOW (ref 98–111)
Creatinine, Ser: 1.02 mg/dL (ref 0.61–1.24)
GFR calc Af Amer: >60 mL/min (ref >60)
GFR calc non Af Amer: >60 mL/min (ref >60)
Glucose, Bld: 142 mg/dL — ABNORMAL HIGH (ref 70–99)
Potassium: 4.2 mmol/L (ref 3.5–5.1)
Sodium: 132 mmol/L — ABNORMAL LOW (ref 135–145)

## 2018-11-05 LAB — TROPONIN I: Troponin I: <0.03 ng/mL (ref <0.03)

## 2018-11-05 LAB — SARS CORONAVIRUS 2 BY RT PCR (HOSPITAL ORDER, PERFORMED IN ~~LOC~~ HOSPITAL LAB): SARS Coronavirus 2: NEGATIVE

## 2018-11-05 LAB — BRAIN NATRIURETIC PEPTIDE: B Natriuretic Peptide: 1399 pg/mL — ABNORMAL HIGH (ref 0.0–100.0)

## 2018-11-05 MED ORDER — OXYCODONE-ACETAMINOPHEN 5-325 MG PO TABS
2.0000 | ORAL_TABLET | Freq: Once | ORAL | Status: AC
  Administered 2018-11-05: 2 via ORAL
  Filled 2018-11-05: qty 2

## 2018-11-05 MED ORDER — FUROSEMIDE 10 MG/ML IJ SOLN
40.0000 mg | Freq: Once | INTRAMUSCULAR | Status: AC
  Administered 2018-11-05: 40 mg via INTRAVENOUS
  Filled 2018-11-05: qty 4

## 2018-11-05 NOTE — H&P (Signed)
[name] NAME: [name] [name]    MR#:  [id number]  DATE OF BIRTH:  04/02/1961  DATE OF ADMISSION:  11/05/2018  PRIMARY CARE PHYSICIAN: Clarisse Gouge, MD   REQUESTING/REFERRING PHYSICIAN: Corky Downs, MD  CHIEF COMPLAINT:   Chief Complaint  Patient presents with  . Leg Swelling    HISTORY OF PRESENT ILLNESS:  [name] [name]  is a 58 y.o. male who presents with chief complaint as above.  Patient presents the ED with a complaint of 2 weeks of progressive lower extremity swelling and increasing shortness of breath.  He states that for the past 2-3 nights he is had significant orthopnea, and has been unable to lay flat.  On evaluation here in the ED his BNP is significantly elevated, and chest x-ray is consistent with heart failure.  He was given some IV diuresis and hospitalist were called for admission.  PAST MEDICAL HISTORY:   Past Medical History:  Diagnosis Date  . Arthritis   . Asthma   . Atherosclerosis   . BPH (benign prostatic hyperplasia)   . Carotid arterial disease ([place])   . Charcot's joint of foot, right   . Chronic combined systolic (congestive) and diastolic (congestive) heart failure ([place])    a. 02/2013 EF 50% by LV gram; b. 04/2017 Echo: EF 25-30%. diff HK. Gr2 DD. Mod dil LA/RV. PASP 16mmHg.  [name] COPD (chronic obstructive pulmonary disease) ([place])   . Coronary artery disease    a. 2013 S/P PCI of LAD [facility]);  b. 03/2013 PCI: RCA 90p ( 2.5 x 23 mm DES); c. 02/2014 Cath: patent RCA stent->Med Rx; d. 04/2017 Cath: LM nl, LAd 30ost/p, 44m/d, D1 80ost, LCX 90p/m, OM1 85, RCA 70/20p, 65m, 70d->Referred for CT Surg-felt to be poor candidate.  . Diabetic neuropathy ([place])   . Diabetic ulcer of right foot ([place])   . Hernia   . Hyperlipidemia   . Hypertension   . Ischemic cardiomyopathy    a. 04/2017 Echo: EF 25-30%; b. 08/2017 Cardiac MRI ([facility]): EF 19%, sev glob HK. RVEF 20%, mod BAE, triv MR, mild-mod TR. Basal lateral  subendocardial infarct (viable), inf/infsept, dist septal ischemia, basal to mid lat peri-infarct ischemia.  . Morbid obesity ([place])   . Neuropathy   . PAD (peripheral artery disease) ([place])    a. Followed by Dr. Lucky Cowboy; b. 08/2010 Periph Angio: RSFA 70-80p (6X60 self-expanding stent); c. 09/2010 Periph Angio: L SFA 100p (7x unknown length self-expanding stent); d. 06/2015 Periph Angio: R SFA short segment occlusion (6x12 self-expanding stent).  . Restless leg syndrome   . Sleep apnea   . Subclavian artery stenosis, right ([place])   . Syncope and collapse   . Tobacco use    a. 75+ yr hx - still smoking 1ppd, down from 2 ppd.  . Type II diabetes mellitus ([place])   . Varicose vein      PAST SURGICAL HISTORY:   Past Surgical History:  Procedure Laterality Date  . ABDOMINAL AORTAGRAM N/A 06/23/2013   Procedure: ABDOMINAL Maxcine Ham;  Surgeon: Wellington Hampshire, MD;  Location: [facility] CATH [facility];  Service: Cardiovascular;  Laterality: N/A;  . AMPUTATION Right 04/01/2018   Procedure: AMPUTATION BELOW KNEE;  Surgeon: Algernon Huxley, MD;  Location: [facility] ORS;  Service: Vascular;  Laterality: Right;  . APPENDECTOMY    . CARDIAC CATHETERIZATION  10/14   [facility]; X1 STENT PROXIMAL RCA  . CARDIAC CATHETERIZATION  02/2010   [facility]  . CARDIAC CATHETERIZATION  02/21/2014   [facility]  . CLAVICLE SURGERY    . HERNIA REPAIR    . IRRIGATION AND DEBRIDEMENT FOOT Right 01/04/2018   Procedure: IRRIGATION AND DEBRIDEMENT FOOT;  Surgeon: Samara Deist, DPM;  Location: [facility] ORS;  Service: Podiatry;  Laterality: Right;  . LEFT HEART CATH AND CORS/GRAFTS ANGIOGRAPHY N/A 04/28/2017   Procedure: LEFT HEART CATH AND CORONARY ANGIOGRAPHY;  Surgeon: Wellington Hampshire, MD;  Location: [facility] CV LAB;  Service: Cardiovascular;  Laterality: N/A;  . LOWER EXTREMITY ANGIOGRAPHY Right 03/03/2018   Procedure: LOWER EXTREMITY ANGIOGRAPHY;  Surgeon: Katha Cabal, MD;  Location: [place] CV LAB;  Service: Cardiovascular;  Laterality: Right;   . LOWER EXTREMITY ANGIOGRAPHY Left 08/24/2018   Procedure: LOWER EXTREMITY ANGIOGRAPHY;  Surgeon: Algernon Huxley, MD;  Location: [place] CV LAB;  Service: Cardiovascular;  Laterality: Left;  . PERIPHERAL ARTERIAL STENT GRAFT     x2 left/right  . PERIPHERAL VASCULAR BALLOON ANGIOPLASTY Right 01/06/2018   Procedure: PERIPHERAL VASCULAR BALLOON ANGIOPLASTY;  Surgeon: Katha Cabal, MD;  Location: [place] CV LAB;  Service: Cardiovascular;  Laterality: Right;  . PERIPHERAL VASCULAR CATHETERIZATION N/A 07/06/2015   Procedure: Abdominal Aortogram w/Lower Extremity;  Surgeon: Algernon Huxley, MD;  Location: [place] CV LAB;  Service: Cardiovascular;  Laterality: N/A;  . PERIPHERAL VASCULAR CATHETERIZATION  07/06/2015   Procedure: Lower Extremity Intervention;  Surgeon: Algernon Huxley, MD;  Location: [place] CV LAB;  Service: Cardiovascular;;     SOCIAL HISTORY:   Social History   Tobacco Use  . Smoking status: Current Every Day Smoker    Packs/day: 1.00    Years: 41.00    Pack years: 41.00    Types: Cigarettes  . Smokeless tobacco: Never Used  Substance Use Topics  . Alcohol use: No     FAMILY HISTORY:   Family History  Problem Relation Age of Onset  . Heart attack Father   . Heart disease Father   . Hypertension Father   . Hyperlipidemia Father   . Hypertension Mother   . Hyperlipidemia Mother      DRUG ALLERGIES:   Allergies  Allergen Reactions  . Dulaglutide Anaphylaxis, Diarrhea and Hives  . Prednisone Rash    MEDICATIONS AT HOME:   Prior to Admission medications   Medication Sig Start Date End Date Taking? Authorizing Provider  aspirin EC 81 MG tablet Take 1 tablet (81 mg total) by mouth daily. 05/11/15   Wellington Hampshire, MD  atorvastatin (LIPITOR) 80 MG tablet Take 80 mg by mouth daily.    [provider]  calcium carbonate (OS-CAL) 1250 (500 Ca) MG chewable tablet Chew by mouth.    [provider]  calcium carbonate (TUMS -  DOSED IN MG ELEMENTAL CALCIUM) 500 MG chewable tablet Chew 4 tablets by mouth daily as needed for indigestion or heartburn.    [provider]  clopidogrel (PLAVIX) 75 MG tablet Take 75 mg by mouth daily.  08/22/17   [provider]  diphenhydrAMINE (BENADRYL) 25 MG tablet Take 25 mg by mouth daily as needed for allergies.    [provider]  doxycycline (VIBRAMYCIN) 100 MG capsule Take 1 capsule (100 mg total) by mouth 2 (two) times daily. 08/19/18   Kris Hartmann, NP  ezetimibe (ZETIA) 10 MG tablet Take 10 mg by mouth daily.    [provider]  fluticasone furoate-vilanterol (BREO ELLIPTA) 100-25 MCG/INH AEPB Inhale 1 puff into the lungs daily.  04/14/18   [provider]  furosemide (LASIX) 40 MG tablet Take 40 mg by mouth daily. May take twice daily only if needed 01/15/18   [provider]  gabapentin (NEURONTIN) 800 MG tablet Take 800 mg by mouth 4 (four) times daily.  08/14/17 10/15/18  [provider]  Insulin Glargine (LANTUS SOLOSTAR) 100 UNIT/ML Solostar Pen Inject 30 Units into the skin at bedtime. 04/07/18   Mayo, Pete Pelt, MD  insulin lispro (HUMALOG) 100 UNIT/ML injection Sliding Scale Insulin before meals using Novolog If BS is 0 to 70 no insulin and eat/glucose tablets/glucose get If BS 71 to 150 great! If BS 151 to 200, 2 Units Novolog If BS 201 to 250, 4 Units Novolog If BS 251 to 300, 6 Units Novolog If BS 301 to 350, 8 Units Novolog If BS 351 to 400, 10 Units Novolog If BS greater than 400, take 10 Units and Contact our office during office hour 04/14/18   [provider]  ipratropium-albuterol (DUONEB) 0.5-2.5 (3) MG/3ML SOLN Inhale 3 mLs into the lungs every 6 (six) hours as needed (wheezing/sob).     [provider]  magnesium oxide (MAG-OX) 400 MG tablet Take 400 mg by mouth daily.  04/16/18 04/16/19  [provider]  metFORMIN (GLUCOPHAGE-XR) 500 MG 24 hr tablet Take 500 mg by mouth 2 (two) times  daily.     [provider]  metoprolol succinate (TOPROL-XL) 25 MG 24 hr tablet Take 25 mg by mouth daily.  03/19/18 03/19/19  [provider]  Multiple Vitamin (MULTI-VITAMINS) TABS Take 1 tablet by mouth daily.     [provider]  multivitamin-lutein (OCUVITE-LUTEIN) CAPS capsule Take 1 capsule by mouth daily. 01/09/18   Demetrios Loll, MD  nitroGLYCERIN (NITROSTAT) 0.4 MG SL tablet Place 0.4 mg under the tongue every 5 (five) minutes as needed for chest pain.    [provider]  Oxycodone HCl 10 MG TABS Take 10 mg by mouth every 6 (six) hours as needed (pain).  01/15/18   [provider]  potassium chloride (K-DUR) 10 MEQ tablet Take 10 mEq by mouth daily.  02/24/18 02/24/19  [provider]  promethazine (PHENERGAN) 25 MG tablet TAKE 1 TABLET BY MOUTH EVERY 6 HOURS AS NEEDED NAUSEA 04/29/18   [provider]  ranitidine (ZANTAC) 150 MG tablet Take 300 mg by mouth daily as needed for heartburn.    [provider]  spironolactone (ALDACTONE) 25 MG tablet Take 25 mg by mouth daily.  03/02/18   [provider]  traZODone (DESYREL) 50 MG tablet Take 50 mg by mouth at bedtime as needed for sleep.  08/12/17   [provider]    REVIEW OF SYSTEMS:  Review of Systems  Constitutional: Negative for chills, fever, malaise/fatigue and weight loss.  HENT: Negative for ear pain, hearing loss and tinnitus.   Eyes: Negative for blurred vision, double vision, pain and redness.  Respiratory: Positive for shortness of breath. Negative for cough and hemoptysis.   Cardiovascular: Positive for orthopnea and leg swelling. Negative for chest pain and palpitations.  Gastrointestinal: Negative for abdominal pain, constipation, diarrhea, nausea and vomiting.  Genitourinary: Negative for dysuria, frequency and hematuria.  Musculoskeletal: Negative for back pain, joint pain and neck pain.  Skin:       No acne, rash, or lesions  Neurological:  Negative for dizziness, tremors, focal weakness and weakness.  Endo/Heme/Allergies: Negative for polydipsia. Does not bruise/bleed easily.  Psychiatric/Behavioral: Negative for depression. The patient is not nervous/anxious and does not  have insomnia.      VITAL SIGNS:   Vitals:   11/05/18 1839  BP: 119/69  Pulse: (!) 107  Resp: 17  Temp: 98.4 F (36.9 C)  TempSrc: Oral  SpO2: 98%  Weight: 104.3 kg  Height: 5\' 7"  (1.702 m)   Wt Readings from Last 3 Encounters:  11/05/18 104.3 kg  10/15/18 104.3 kg  09/10/18 100.7 kg    PHYSICAL EXAMINATION:  Physical Exam  Vitals reviewed. Constitutional: He is oriented to person, place, and time. He appears well-developed and well-nourished. No distress.  HENT:  Head: Normocephalic and atraumatic.  Mouth/Throat: Oropharynx is clear and moist.  Eyes: Pupils are equal, round, and reactive to light. Conjunctivae and EOM are normal. No scleral icterus.  Neck: Normal range of motion. Neck supple. No JVD present. No thyromegaly present.  Cardiovascular: Normal rate, regular rhythm and intact distal pulses. Exam reveals no gallop and no friction rub.  No murmur heard. Respiratory: Effort normal. No respiratory distress. He has no wheezes. He has rales.  GI: Soft. Bowel sounds are normal. He exhibits no distension. There is no abdominal tenderness.  Musculoskeletal: Normal range of motion.        General: Edema present.     Comments: No arthritis, no gout  Lymphadenopathy:    He has no cervical adenopathy.  Neurological: He is alert and oriented to person, place, and time. No cranial nerve deficit.  No dysarthria, no aphasia  Skin: Skin is warm and dry. No rash noted. No erythema.  Psychiatric: He has a normal mood and affect. His behavior is normal. Judgment and thought content normal.    LABORATORY PANEL:   CBC Recent Labs  Lab 11/05/18 1844  WBC 7.0  HGB 10.9*  HCT 36.9*  PLT 289    ------------------------------------------------------------------------------------------------------------------  Chemistries  Recent Labs  Lab 11/05/18 1844  NA 132*  K 4.2  CL 95*  CO2 27  GLUCOSE 142*  BUN 14  CREATININE 1.02  CALCIUM 8.6*   ------------------------------------------------------------------------------------------------------------------  Cardiac Enzymes Recent Labs  Lab 11/05/18 1844  TROPONINI <0.03   ------------------------------------------------------------------------------------------------------------------  RADIOLOGY:  Dg Chest Portable 1 View  Result Date: 11/05/2018 CLINICAL DATA:  Shortness of breath EXAM: PORTABLE CHEST 1 VIEW COMPARISON:  03/30/2018 FINDINGS: The heart size is enlarged. There are prominent interstitial lung markings bilaterally. There is no pneumothorax. No acute osseous abnormality. Chronic changes of the distal right clavicle are again noted. There may be trace bilateral pleural effusions. Aortic calcifications are noted. IMPRESSION: Cardiomegaly with findings suspicious for developing pulmonary edema. Electronically Signed   By: Constance Holster M.D.   On: 11/05/2018 20:07    EKG:   Orders placed or performed during the hospital encounter of 11/05/18  . ED EKG  . ED EKG  . EKG 12-Lead  . EKG 12-Lead    IMPRESSION AND PLAN:  Principal Problem:   Acute on chronic systolic CHF (congestive heart failure) (HCC) -IV Lasix given, we will give another dose later on tonight, and continue his home dose Lasix starting tomorrow.  Echocardiogram ordered, cardiology consult placed Active Problems:   Coronary artery disease of native artery of native heart with stable angina pectoris ([place]) -continue home meds   Hypertension -home dose antihypertensives   Type 2 diabetes mellitus with complication, with long-term current use of insulin (HCC) -sliding scale insulin coverage   COPD (chronic obstructive pulmonary disease)  (HCC) -home meds   HLD (hyperlipidemia) -home meds  Chart review performed and case discussed with ED provider.  Labs, imaging and/or ECG reviewed by provider and discussed with patient/family. Management plans discussed with the patient and/or family.  COVID-19 status: Tested negative     DVT PROPHYLAXIS: SubQ lovenox   GI PROPHYLAXIS:  None   ADMISSION STATUS: Inpatient     CODE STATUS: Full Code Status History    Date Active Date Inactive Code Status Order ID Comments User Context   08/24/2018 0939 08/24/2018 1444 Full Code [id number]  Algernon Huxley, MD Inpatient   07/27/2018 2248 07/28/2018 1836 Full Code [id number]  Vaughan Basta, MD Inpatient   03/30/2018 1839 04/07/2018 2116 Full Code [id number]  Nicholes Mango, MD Inpatient   03/03/2018 1044 03/03/2018 1545 Full Code [id number]  Delana Meyer Dolores Lory, MD Inpatient   01/03/2018 0409 01/09/2018 1723 Full Code [id number]  Lance Coon, MD Inpatient   12/22/2017 0607 12/23/2017 1916 Full Code [id number]  Harrie Foreman, MD Inpatient   11/21/2017 0415 11/23/2017 1444 Full Code [id number]  Lance Coon, MD Inpatient   04/23/2017 0355 04/29/2017 1353 Full Code [id number]  Saundra Shelling, MD Inpatient   07/11/2016 0324 07/11/2016 1851 Full Code [id number]  Saundra Shelling, MD Inpatient   05/06/2016 0005 05/07/2016 1417 Full Code [id number]  Harvie Bridge, DO Inpatient   11/21/2015 1716 11/22/2015 1950 Full Code [id number]  Gladstone Lighter, MD Inpatient   07/06/2015 0948 07/06/2015 1437 Full Code [id number]  Algernon Huxley, MD Inpatient      TOTAL TIME TAKING CARE OF THIS PATIENT: 45 minutes.   This patient was evaluated in the context of the global COVID-19 pandemic, which necessitated consideration that the patient might be at risk for infection with the SARS-CoV-2 virus that causes COVID-19. Institutional protocols and algorithms that pertain to the evaluation of patients at risk for COVID-19 are in a state of rapid change based on information  released by regulatory bodies including the CDC and federal and state organizations. These policies and algorithms were followed to the best of this provider's knowledge to date during the patient's care at this facility.  Ethlyn Daniels 11/05/2018, 10:51 PM  [facility]  Office  [phone]  CC: Primary care physician; Clarisse Gouge, MD  Note:  This document was prepared using Dragon voice recognition software and may include unintentional dictation errors.

## 2018-11-05 NOTE — ED Notes (Signed)
CALLED TO GIVE REPORT TO 2A RM250, PER LEXIE RN, ROOM IS BEING CLEANED AND WILL NOT BE READY FOR 20 MINUTES. WILL CALL BACK AT 2340.

## 2018-11-05 NOTE — ED Provider Notes (Signed)
[facility] Emergency Department Provider Note   ____________________________________________    I have reviewed the triage vital signs and the nursing notes.   HISTORY  Chief Complaint Leg Swelling     HPI [name] [name] is a 58 y.o. male who presents with complaints of left leg swelling and worsening shortness of breath over the last week.  Patient reports a history of CHF, reports compliance with his Lasix however his leg has swollen significantly, he has a history of a BKA on the right, and he feels markedly short of breath especially with lying flat and exertion.  He denies fevers or chills.  No nausea or vomiting or chest pain.  Past Medical History:  Diagnosis Date  . Arthritis   . Asthma   . Atherosclerosis   . BPH (benign prostatic hyperplasia)   . Carotid arterial disease ([place])   . Charcot's joint of foot, right   . Chronic combined systolic (congestive) and diastolic (congestive) heart failure ([place])    a. 02/2013 EF 50% by LV gram; b. 04/2017 Echo: EF 25-30%. diff HK. Gr2 DD. Mod dil LA/RV. PASP 4mmHg.  [name] COPD (chronic obstructive pulmonary disease) ([place])   . Coronary artery disease    a. 2013 S/P PCI of LAD [facility]);  b. 03/2013 PCI: RCA 90p ( 2.5 x 23 mm DES); c. 02/2014 Cath: patent RCA stent->Med Rx; d. 04/2017 Cath: LM nl, LAd 30ost/p, 48m/d, D1 80ost, LCX 90p/m, OM1 85, RCA 70/20p, 11m, 70d->Referred for CT Surg-felt to be poor candidate.  . Diabetic neuropathy ([place])   . Diabetic ulcer of right foot ([place])   . Hernia   . Hyperlipidemia   . Hypertension   . Ischemic cardiomyopathy    a. 04/2017 Echo: EF 25-30%; b. 08/2017 Cardiac MRI ([facility]): EF 19%, sev glob HK. RVEF 20%, mod BAE, triv MR, mild-mod TR. Basal lateral subendocardial infarct (viable), inf/infsept, dist septal ischemia, basal to mid lat peri-infarct ischemia.  . Morbid obesity ([place])   . Neuropathy   . PAD (peripheral artery disease) ([place])    a. Followed by Dr. Lucky Cowboy; b.  08/2010 Periph Angio: RSFA 70-80p (6X60 self-expanding stent); c. 09/2010 Periph Angio: L SFA 100p (7x unknown length self-expanding stent); d. 06/2015 Periph Angio: R SFA short segment occlusion (6x12 self-expanding stent).  . Restless leg syndrome   . Sleep apnea   . Subclavian artery stenosis, right ([place])   . Syncope and collapse   . Tobacco use    a. 75+ yr hx - still smoking 1ppd, down from 2 ppd.  . Type II diabetes mellitus ([place])   . Varicose vein     Patient Active Problem List   Diagnosis Date Noted  . Open toe wound 10/01/2018  . Venous ulcer of left leg ([place]) 08/19/2018  . 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[place]) 04/28/2018  . Sepsis ([place]) 03/30/2018  . Abnormal MRI, shoulder (Right) 02/23/2018  . Abnormal MRI, cervical spine (2016) 02/23/2018  . DDD (degenerative disc disease), cervical 02/23/2018  . Cervical foraminal stenosis (Bilateral) 02/23/2018  . Cervical central spinal stenosis 02/23/2018  . Cervical facet hypertrophy 02/23/2018  . Arthralgia of acromioclavicular joint (Right) 02/23/2018  . Osteoarthritis of  AC (acromioclavicular) joint (Right) 02/23/2018  . Biceps tendinosis of shoulder (Right) 02/23/2018  . Tendinopathy of rotator cuff (Right) 02/23/2018  . Vitamin D deficiency 02/23/2018  . Atherosclerosis of native arteries of the extremities with ulceration ([place]) 02/15/2018  . Peripheral edema 02/05/2018  . Status post  peripherally inserted central catheter (PICC) central line placement 02/04/2018  . Chronic lower extremity pain (Primary Area of Pain) (Right) 01/29/2018  . Chronic shoulder pain [facility] Area of Pain) (Right) 01/29/2018  . Chronic foot pain (Secondary Area of Pain) (Right) 01/29/2018  . Chronic pain syndrome 01/29/2018  . Long term current use of opiate analgesic 01/29/2018  . Medication monitoring encounter 01/29/2018  . Disorder of skeletal system 01/29/2018  . Problems influencing health status 01/29/2018  . AKI (acute  kidney injury) ([place]) 01/03/2018  . NSTEMI (non-ST elevated myocardial infarction) ([place]) 11/21/2017  . Coronary artery disease of native artery of native heart with stable angina pectoris ([place]) 09/10/2017  . HLD (hyperlipidemia) 09/10/2017  . Angina pectoris ([place]) 09/08/2017  . Angina at rest [facility]) 08/13/2017  . Lymphedema 05/02/2017  . Chronic systolic HF (heart failure) ([place]) 04/23/2017  . Chest pain 07/11/2016  . Unstable angina ([place]) 07/11/2016  . Foot ulcer (Right)   . Stable angina (HCC)   . Pressure injury of skin 05/06/2016  . Diabetic foot infection ([place]) 05/05/2016  . Type 2 diabetes mellitus with complication, with long-term current use of insulin ([place]) 01/26/2016  . Cellulitis 11/21/2015  . Morbid obesity ([place]) 11/17/2014  . PAD (peripheral artery disease) ([place])   . Hypertension   . Tobacco use   . COPD (chronic obstructive pulmonary disease) ([place]) 05/19/2012  . Diabetic neuropathy ([place]) 05/19/2012    Past Surgical History:  Procedure Laterality Date  . ABDOMINAL AORTAGRAM N/A 06/23/2013   Procedure: ABDOMINAL Maxcine [place];  Surgeon: Wellington Hampshire, MD;  Location: [place] CATH LAB;  Service: Cardiovascular;  Laterality: N/A;  . AMPUTATION Right 04/01/2018   Procedure: AMPUTATION BELOW KNEE;  Surgeon: Algernon Huxley, MD;  Location: [facility] ORS;  Service: Vascular;  Laterality: Right;  . APPENDECTOMY    . CARDIAC CATHETERIZATION  10/14   [facility]; X1 STENT PROXIMAL RCA  . CARDIAC CATHETERIZATION  02/2010   [facility]  . CARDIAC CATHETERIZATION  02/21/2014   [facility]  . CLAVICLE SURGERY    . HERNIA REPAIR    . IRRIGATION AND DEBRIDEMENT FOOT Right 01/04/2018   Procedure: IRRIGATION AND DEBRIDEMENT FOOT;  Surgeon: Samara Deist, DPM;  Location: [facility] ORS;  Service: Podiatry;  Laterality: Right;  . LEFT HEART CATH AND CORS/GRAFTS ANGIOGRAPHY N/A 04/28/2017   Procedure: LEFT HEART CATH AND CORONARY ANGIOGRAPHY;  Surgeon: Wellington Hampshire, MD;  Location: [place] CV LAB;  Service:  Cardiovascular;  Laterality: N/A;  . LOWER EXTREMITY ANGIOGRAPHY Right 03/03/2018   Procedure: LOWER EXTREMITY ANGIOGRAPHY;  Surgeon: Katha Cabal, MD;  Location: [place] CV LAB;  Service: Cardiovascular;  Laterality: Right;  . LOWER EXTREMITY ANGIOGRAPHY Left 08/24/2018   Procedure: LOWER EXTREMITY ANGIOGRAPHY;  Surgeon: Algernon Huxley, MD;  Location: [place] CV LAB;  Service: Cardiovascular;  Laterality: Left;  . PERIPHERAL ARTERIAL STENT GRAFT     x2 left/right  . PERIPHERAL VASCULAR BALLOON ANGIOPLASTY Right 01/06/2018   Procedure: PERIPHERAL VASCULAR BALLOON ANGIOPLASTY;  Surgeon: Katha Cabal, MD;  Location: [place] CV LAB;  Service: Cardiovascular;  Laterality: Right;  . PERIPHERAL VASCULAR CATHETERIZATION N/A 07/06/2015   Procedure: Abdominal Aortogram w/Lower Extremity;  Surgeon: Algernon Huxley, MD;  Location: [place] CV LAB;  Service: Cardiovascular;  Laterality: N/A;  . PERIPHERAL VASCULAR CATHETERIZATION  07/06/2015   Procedure: Lower Extremity Intervention;  Surgeon: Algernon Huxley, MD;  Location: [place] CV LAB;  Service: Cardiovascular;;    Prior to Admission medications   Medication Sig Start  Date End Date Taking? Authorizing Provider  aspirin EC 81 MG tablet Take 1 tablet (81 mg total) by mouth daily. 05/11/15   Wellington Hampshire, MD  atorvastatin (LIPITOR) 80 MG tablet Take 80 mg by mouth daily.    [provider]  calcium carbonate (OS-CAL) 1250 (500 Ca) MG chewable tablet Chew by mouth.    [provider]  calcium carbonate (TUMS - DOSED IN MG ELEMENTAL CALCIUM) 500 MG chewable tablet Chew 4 tablets by mouth daily as needed for indigestion or heartburn.    [provider]  clopidogrel (PLAVIX) 75 MG tablet Take 75 mg by mouth daily.  08/22/17   [provider]  diphenhydrAMINE (BENADRYL) 25 MG tablet Take 25 mg by mouth daily as needed for allergies.    [provider]  doxycycline (VIBRAMYCIN) 100 MG  capsule Take 1 capsule (100 mg total) by mouth 2 (two) times daily. 08/19/18   Kris Hartmann, NP  ezetimibe (ZETIA) 10 MG tablet Take 10 mg by mouth daily.    [provider]  fluticasone furoate-vilanterol (BREO ELLIPTA) 100-25 MCG/INH AEPB Inhale 1 puff into the lungs daily.  04/14/18   [provider]  furosemide (LASIX) 40 MG tablet Take 40 mg by mouth daily. May take twice daily only if needed 01/15/18   [provider]  gabapentin (NEURONTIN) 800 MG tablet Take 800 mg by mouth 4 (four) times daily.  08/14/17 10/15/18  [provider]  Insulin Glargine (LANTUS SOLOSTAR) 100 UNIT/ML Solostar Pen Inject 30 Units into the skin at bedtime. 04/07/18   Mayo, Pete Pelt, MD  insulin lispro (HUMALOG) 100 UNIT/ML injection Sliding Scale Insulin before meals using Novolog If BS is 0 to 70 no insulin and eat/glucose tablets/glucose get If BS 71 to 150 great! If BS 151 to 200, 2 Units Novolog If BS 201 to 250, 4 Units Novolog If BS 251 to 300, 6 Units Novolog If BS 301 to 350, 8 Units Novolog If BS 351 to 400, 10 Units Novolog If BS greater than 400, take 10 Units and Contact our office during office hour 04/14/18   [provider]  ipratropium-albuterol (DUONEB) 0.5-2.5 (3) MG/3ML SOLN Inhale 3 mLs into the lungs every 6 (six) hours as needed (wheezing/sob).     [provider]  magnesium oxide (MAG-OX) 400 MG tablet Take 400 mg by mouth daily.  04/16/18 04/16/19  [provider]  metFORMIN (GLUCOPHAGE-XR) 500 MG 24 hr tablet Take 500 mg by mouth 2 (two) times daily.     [provider]  metoprolol succinate (TOPROL-XL) 25 MG 24 hr tablet Take 25 mg by mouth daily.  03/19/18 03/19/19  [provider]  Multiple Vitamin (MULTI-VITAMINS) TABS Take 1 tablet by mouth daily.     [provider]  multivitamin-lutein (OCUVITE-LUTEIN) CAPS capsule Take 1 capsule by mouth daily. 01/09/18   Demetrios Loll, MD  nitroGLYCERIN (NITROSTAT) 0.4 MG SL  tablet Place 0.4 mg under the tongue every 5 (five) minutes as needed for chest pain.    [provider]  Oxycodone HCl 10 MG TABS Take 10 mg by mouth every 6 (six) hours as needed (pain).  01/15/18   [provider]  potassium chloride (K-DUR) 10 MEQ tablet Take 10 mEq by mouth daily.  02/24/18 02/24/19  [provider]  promethazine (PHENERGAN) 25 MG tablet TAKE 1 TABLET BY MOUTH EVERY 6 HOURS AS NEEDED NAUSEA 04/29/18   [provider]  ranitidine (ZANTAC) 150 MG tablet Take  300 mg by mouth daily as needed for heartburn.    [provider]  spironolactone (ALDACTONE) 25 MG tablet Take 25 mg by mouth daily.  03/02/18   [provider]  traZODone (DESYREL) 50 MG tablet Take 50 mg by mouth at bedtime as needed for sleep.  08/12/17   [provider]     Allergies Dulaglutide and Prednisone  Family History  Problem Relation Age of Onset  . Heart attack Father   . Heart disease Father   . Hypertension Father   . Hyperlipidemia Father   . Hypertension Mother   . Hyperlipidemia Mother     Social History Social History   Tobacco Use  . Smoking status: Current Every Day Smoker    Packs/day: 1.00    Years: 41.00    Pack years: 41.00    Types: Cigarettes  . Smokeless tobacco: Never Used  Substance Use Topics  . Alcohol use: No  . Drug use: No    Review of Systems  Constitutional: No fever/chills Eyes: No visual changes.  ENT: No sore throat. Cardiovascular: Denies chest pain. Respiratory: As above Gastrointestinal: No abdominal pain.  No nausea, no vomiting.   Genitourinary: Negative for dysuria. Musculoskeletal: Edema as above Skin: Negative for rash. Neurological: Negative for headaches    ____________________________________________   PHYSICAL EXAM:  VITAL SIGNS: ED Triage Vitals  Enc Vitals Group     BP 11/05/18 1839 119/69     Pulse Rate 11/05/18 1839 (!) 107     Resp 11/05/18 1839 17     Temp 11/05/18  1839 98.4 F (36.9 C)     Temp Source 11/05/18 1839 Oral     SpO2 11/05/18 1839 98 %     Weight 11/05/18 1839 104.3 kg (230 lb)     Height 11/05/18 1839 1.702 m (5\' 7" )     Head Circumference --      Peak Flow --      Pain Score 11/05/18 1841 8     Pain Loc --      Pain Edu? --      Excl. in [place]? --     Constitutional: Alert and oriented. No acute distress. Pleasant and interactive Eyes: Conjunctivae are normal.  Head: Atraumatic. Nose: No congestion/rhinnorhea. Mouth/Throat: Mucous membranes are moist.   Neck:  Painless ROM Cardiovascular: Normal rate, regular rhythm. Grossly normal heart sounds.  Good peripheral circulation. Respiratory: Normal respiratory effort.  No retractions. Lungs CTAB. Gastrointestinal: Soft and nontender. No distention.  No CVA tenderness. Genitourinary: deferred Musculoskeletal: No lower extremity tenderness nor edema.  Warm and well perfused Neurologic:  Normal speech and language. No gross focal neurologic deficits are appreciated.  Skin:  Skin is warm, dry and intact. No rash noted. Psychiatric: Mood and affect are normal. Speech and behavior are normal.  ____________________________________________   LABS (all labs ordered are listed, but only abnormal results are displayed)  Labs Reviewed  BASIC METABOLIC PANEL - Abnormal; Notable for the following components:      Result Value   Sodium 132 (*)    Chloride 95 (*)    Glucose, Bld 142 (*)    Calcium 8.6 (*)    All other components within normal limits  CBC - Abnormal; Notable for the following components:   Hemoglobin 10.9 (*)    HCT 36.9 (*)    MCV 65.2 (*)    MCH 19.3 (*)    MCHC 29.5 (*)    RDW 20.7 (*)    All  other components within normal limits  BRAIN NATRIURETIC PEPTIDE - Abnormal; Notable for the following components:   B Natriuretic Peptide 1,399.0 (*)    All other components within normal limits  SARS CORONAVIRUS 2 (HOSPITAL ORDER, [place] [facility])   TROPONIN I   ____________________________________________  EKG  ED ECG REPORT I, Lavonia Drafts, the attending physician, personally viewed and interpreted this ECG.  Date: 11/05/2018  Rhythm: sinus tachycardia QRS Axis: normal Intervals: abnormal  ST/T Wave abnormalities: non specific changes   ____________________________________________  RADIOLOGY  Chest x-ray consistent with pulmonary edema ____________________________________________   PROCEDURES  Procedure(s) performed: No  Procedures   Critical Care performed: No ____________________________________________   INITIAL IMPRESSION / ASSESSMENT AND PLAN / ED COURSE  Pertinent labs & imaging results that were available during my care of the patient were reviewed by me and considered in my medical decision making (see chart for details).  Patient with a history of diabetes, right BKA, CHF, CAD presents with worsening shortness of breath, edema most concerning for pulmonary edema, CHF exacerbation.  Denies myalgias or fevers but COVID-19 is also on the differential.    ____________________________________________   FINAL CLINICAL IMPRESSION(S) / ED DIAGNOSES  Final diagnoses:  None        Note:  This document was prepared using Dragon voice recognition software and may include unintentional dictation errors.   Lavonia Drafts, MD 11/05/18 2108

## 2018-11-05 NOTE — ED Notes (Signed)
Pt's significant other called per pt request and updated on plan for pt to be admitted.

## 2018-11-05 NOTE — ED Triage Notes (Signed)
Here for increased leg swelling bilateral and SHOB. Pt has SHOB but it has been worse than normal.  Unlabored. VSS.  Pain in both legs. No chest pain.  No fever.

## 2018-11-06 ENCOUNTER — Inpatient Hospital Stay (HOSPITAL_COMMUNITY)
Admit: 2018-11-06 | Discharge: 2018-11-06 | Disposition: A | Discharge status: Home / Self Care | Payer: Medicare Other | Attending: Internal Medicine | Admitting: Internal Medicine

## 2018-11-06 DIAGNOSIS — I361 Nonrheumatic tricuspid (valve) insufficiency: Secondary | ICD-10-CM

## 2018-11-06 DIAGNOSIS — I5023 Acute on chronic systolic (congestive) heart failure: Secondary | ICD-10-CM

## 2018-11-06 DIAGNOSIS — I5022 Chronic systolic (congestive) heart failure: Secondary | ICD-10-CM

## 2018-11-06 DIAGNOSIS — I25118 Atherosclerotic heart disease of native coronary artery with other forms of angina pectoris: Secondary | ICD-10-CM

## 2018-11-06 LAB — CBC
HCT: 37.5 % — ABNORMAL LOW (ref 39.0–52.0)
Hemoglobin: 10.9 g/dL — ABNORMAL LOW (ref 13.0–17.0)
MCH: 19.1 pg — ABNORMAL LOW (ref 26.0–34.0)
MCHC: 29.1 g/dL — ABNORMAL LOW (ref 30.0–36.0)
MCV: 65.6 fL — ABNORMAL LOW (ref 80.0–100.0)
Platelets: 275 10*3/uL (ref 150–400)
RBC: 5.72 MIL/uL (ref 4.22–5.81)
RDW: 20.7 % — ABNORMAL HIGH (ref 11.5–15.5)
WBC: 6.7 10*3/uL (ref 4.0–10.5)
nRBC: 0 % (ref 0.0–0.2)

## 2018-11-06 LAB — ECHOCARDIOGRAM COMPLETE
Height: 67 in
Weight: 3756.81 oz

## 2018-11-06 LAB — BASIC METABOLIC PANEL
Anion gap: 11 (ref 5–15)
BUN: 16 mg/dL (ref 6–20)
CO2: 28 mmol/L (ref 22–32)
Calcium: 8.7 mg/dL — ABNORMAL LOW (ref 8.9–10.3)
Chloride: 96 mmol/L — ABNORMAL LOW (ref 98–111)
Creatinine, Ser: 0.92 mg/dL (ref 0.61–1.24)
GFR calc Af Amer: >60 mL/min (ref >60)
GFR calc non Af Amer: >60 mL/min (ref >60)
Glucose, Bld: 114 mg/dL — ABNORMAL HIGH (ref 70–99)
Potassium: 4.3 mmol/L (ref 3.5–5.1)
Sodium: 135 mmol/L (ref 135–145)

## 2018-11-06 LAB — GLUCOSE, CAPILLARY
Glucose-Capillary: 109 mg/dL — ABNORMAL HIGH (ref 70–99)
Glucose-Capillary: 121 mg/dL — ABNORMAL HIGH (ref 70–99)
Glucose-Capillary: 162 mg/dL — ABNORMAL HIGH (ref 70–99)
Glucose-Capillary: 164 mg/dL — ABNORMAL HIGH (ref 70–99)
Glucose-Capillary: 79 mg/dL (ref 70–99)

## 2018-11-06 MED ORDER — ONDANSETRON HCL 4 MG/2ML IJ SOLN
4.0000 mg | Freq: Four times a day (QID) | INTRAMUSCULAR | Status: DC | PRN
  Administered 2018-11-07 – 2018-11-09 (×3): 4 mg via INTRAVENOUS
  Filled 2018-11-06 (×3): qty 2

## 2018-11-06 MED ORDER — GABAPENTIN 400 MG PO CAPS
400.0000 mg | ORAL_CAPSULE | Freq: Three times a day (TID) | ORAL | Status: DC
  Administered 2018-11-06 – 2018-11-09 (×9): 400 mg via ORAL
  Filled 2018-11-06 (×10): qty 1

## 2018-11-06 MED ORDER — ACETAMINOPHEN 650 MG RE SUPP: 650.0000 mg | Freq: Four times a day (QID) | RECTAL | Status: DC | PRN

## 2018-11-06 MED ORDER — INSULIN ASPART 100 UNIT/ML ~~LOC~~ SOLN: 0.0000 [IU] | Freq: Every day | SUBCUTANEOUS | Status: DC

## 2018-11-06 MED ORDER — SPIRONOLACTONE 25 MG PO TABS
25.0000 mg | ORAL_TABLET | Freq: Every day | ORAL | Status: DC
  Administered 2018-11-06 – 2018-11-09 (×4): 25 mg via ORAL
  Filled 2018-11-06 (×4): qty 1

## 2018-11-06 MED ORDER — ASPIRIN EC 81 MG PO TBEC
81.0000 mg | DELAYED_RELEASE_TABLET | Freq: Every day | ORAL | Status: DC
  Administered 2018-11-06 – 2018-11-09 (×4): 81 mg via ORAL
  Filled 2018-11-06 (×4): qty 1

## 2018-11-06 MED ORDER — OXYCODONE HCL 5 MG PO TABS
10.0000 mg | ORAL_TABLET | Freq: Four times a day (QID) | ORAL | Status: DC | PRN
  Administered 2018-11-06 – 2018-11-09 (×13): 10 mg via ORAL
  Filled 2018-11-06 (×13): qty 2

## 2018-11-06 MED ORDER — TRAZODONE HCL 50 MG PO TABS
50.0000 mg | ORAL_TABLET | Freq: Every evening | ORAL | Status: DC | PRN
  Administered 2018-11-06 – 2018-11-08 (×4): 50 mg via ORAL
  Filled 2018-11-06 (×4): qty 1

## 2018-11-06 MED ORDER — ISOSORBIDE MONONITRATE ER 30 MG PO TB24
30.0000 mg | ORAL_TABLET | Freq: Every day | ORAL | Status: DC
  Administered 2018-11-06 – 2018-11-09 (×4): 30 mg via ORAL
  Filled 2018-11-06 (×4): qty 1

## 2018-11-06 MED ORDER — CLOPIDOGREL BISULFATE 75 MG PO TABS
75.0000 mg | ORAL_TABLET | Freq: Every day | ORAL | Status: DC
  Administered 2018-11-06 – 2018-11-09 (×4): 75 mg via ORAL
  Filled 2018-11-06 (×4): qty 1

## 2018-11-06 MED ORDER — PERFLUTREN LIPID MICROSPHERE
1.0000 mL | INTRAVENOUS | Status: AC | PRN
  Administered 2018-11-06: 2 mL via INTRAVENOUS
  Filled 2018-11-06: qty 10

## 2018-11-06 MED ORDER — ENOXAPARIN SODIUM 40 MG/0.4ML ~~LOC~~ SOLN
40.0000 mg | SUBCUTANEOUS | Status: DC
  Administered 2018-11-06 – 2018-11-09 (×4): 40 mg via SUBCUTANEOUS
  Filled 2018-11-06 (×4): qty 0.4

## 2018-11-06 MED ORDER — METOPROLOL SUCCINATE ER 50 MG PO TB24
25.0000 mg | ORAL_TABLET | Freq: Every day | ORAL | Status: DC
  Administered 2018-11-06 – 2018-11-09 (×4): 25 mg via ORAL
  Filled 2018-11-06 (×4): qty 1

## 2018-11-06 MED ORDER — FLUTICASONE FUROATE-VILANTEROL 100-25 MCG/INH IN AEPB
1.0000 | INHALATION_SPRAY | Freq: Every day | RESPIRATORY_TRACT | Status: DC
  Administered 2018-11-06 – 2018-11-09 (×4): 1 via RESPIRATORY_TRACT
  Filled 2018-11-06: qty 28

## 2018-11-06 MED ORDER — INSULIN ASPART 100 UNIT/ML ~~LOC~~ SOLN
0.0000 [IU] | Freq: Three times a day (TID) | SUBCUTANEOUS | Status: DC
  Administered 2018-11-06: 2 [IU] via SUBCUTANEOUS
  Administered 2018-11-06: 1 [IU] via SUBCUTANEOUS
  Administered 2018-11-07: 2 [IU] via SUBCUTANEOUS
  Administered 2018-11-07: 3 [IU] via SUBCUTANEOUS
  Administered 2018-11-08 (×2): 1 [IU] via SUBCUTANEOUS
  Filled 2018-11-06 (×6): qty 1

## 2018-11-06 MED ORDER — ONDANSETRON HCL 4 MG PO TABS: 4.0000 mg | ORAL_TABLET | Freq: Four times a day (QID) | ORAL | Status: DC | PRN

## 2018-11-06 MED ORDER — FUROSEMIDE 10 MG/ML IJ SOLN
40.0000 mg | Freq: Once | INTRAMUSCULAR | Status: AC
  Administered 2018-11-06: 40 mg via INTRAVENOUS
  Filled 2018-11-06: qty 4

## 2018-11-06 MED ORDER — IPRATROPIUM-ALBUTEROL 0.5-2.5 (3) MG/3ML IN SOLN
3.0000 mL | Freq: Four times a day (QID) | RESPIRATORY_TRACT | Status: DC
  Administered 2018-11-06 – 2018-11-09 (×11): 3 mL via RESPIRATORY_TRACT
  Filled 2018-11-06 (×12): qty 3

## 2018-11-06 MED ORDER — FUROSEMIDE 40 MG PO TABS: 40.0000 mg | ORAL_TABLET | Freq: Every day | ORAL | Status: DC

## 2018-11-06 MED ORDER — ATORVASTATIN CALCIUM 20 MG PO TABS
80.0000 mg | ORAL_TABLET | Freq: Every day | ORAL | Status: DC
  Administered 2018-11-06 – 2018-11-09 (×4): 80 mg via ORAL
  Filled 2018-11-06 (×4): qty 4

## 2018-11-06 MED ORDER — ACETAMINOPHEN 325 MG PO TABS
650.0000 mg | ORAL_TABLET | Freq: Four times a day (QID) | ORAL | Status: DC | PRN
  Administered 2018-11-06 – 2018-11-09 (×2): 650 mg via ORAL
  Filled 2018-11-06 (×2): qty 2

## 2018-11-06 MED ORDER — FUROSEMIDE 10 MG/ML IJ SOLN
40.0000 mg | Freq: Two times a day (BID) | INTRAMUSCULAR | Status: DC
  Administered 2018-11-06 – 2018-11-09 (×7): 40 mg via INTRAVENOUS
  Filled 2018-11-06 (×7): qty 4

## 2018-11-06 MED ORDER — GABAPENTIN 800 MG PO TABS
400.0000 mg | ORAL_TABLET | Freq: Three times a day (TID) | ORAL | Status: DC
  Filled 2018-11-06 (×3): qty 0.5

## 2018-11-06 MED ORDER — FUROSEMIDE 10 MG/ML IJ SOLN: 80.0000 mg | Freq: Two times a day (BID) | INTRAMUSCULAR | Status: DC

## 2018-11-06 NOTE — Progress Notes (Addendum)
[facility] NAME: Gryphon Vanderveen    MR#:  [id number]  DATE OF BIRTH:  07/14/1960  SUBJECTIVE:   Chief Complaint  Patient presents with  . Leg Swelling    Patient presenting with lower extremities swelling and SOB  Patient is seen  at the bedside. Patient is found laying in bed in NAD. Overall he feels his condition is unchanged. He still has shortness of breath and some tightness. He however report that leg swelling and tight ness is better.  REVIEW OF SYSTEMS:  Review of Systems  Constitutional: Negative for chills, fever, malaise/fatigue and weight loss.  HENT: Negative for congestion, hearing loss and sore throat.   Eyes: Negative for blurred vision and double vision.  Respiratory: Positive for shortness of breath and wheezing. Negative for cough.   Cardiovascular: Positive for orthopnea and leg swelling. Negative for chest pain and palpitations.  Gastrointestinal: Negative for abdominal pain, diarrhea, nausea and vomiting.  Genitourinary: Negative for dysuria and urgency.  Musculoskeletal: Negative for myalgias.  Skin: Negative for rash.  Neurological: Negative for dizziness, sensory change, speech change, focal weakness and headaches.  Psychiatric/Behavioral: Negative for depression.   DRUG ALLERGIES:   Allergies  Allergen Reactions  . Dulaglutide Anaphylaxis, Diarrhea and Hives  . Other Itching    Skin itching associated with nitro patch  . Prednisone Rash   VITALS:  Blood pressure 128/87, pulse (!) 105, temperature 97.9 F (36.6 C), temperature source Oral, resp. rate 16, height 5\' 7"  (1.702 m), weight 106.5 kg, SpO2 100 %. PHYSICAL EXAMINATION:   GENERAL:  58 y.o.-year-old patient lying in the bed with no acute distress.  EYES: Pupils equal, round, reactive to light and accommodation. No scleral icterus. Extraocular muscles intact.  HEENT: Head atraumatic, normocephalic. Oropharynx and nasopharynx clear.  NECK:   Supple, no jugular venous distention. No thyroid enlargement, no tenderness.  LUNGS: Decreased breath sounds bilaterally, mild wheezing, rales,rhonchi. no crepitation. No use of accessory muscles of respiration.  CARDIOVASCULAR: S1, S2 normal. No murmurs, rubs, or gallops.  ABDOMEN: Soft, nontender, nondistended. Bowel sounds present. No organomegaly or mass.  EXTREMITIES:  Left lower extremity with mild redness and swelling, changes of chronic venous stasis, no drainage, healing sore. Right lower extremity s/p BKA NEUROLOGIC: Cranial nerves II through XII are intact. Muscle strength 5/5 in all extremities. Sensation intact. Gait not checked.  PSYCHIATRIC: The patient is alert and oriented x 3.  SKIN: No obvious rash, lesion, or ulcer.   DATA REVIEWED:  LABORATORY PANEL:  Male CBC Recent Labs  Lab 11/06/18 0455  WBC 6.7  HGB 10.9*  HCT 37.5*  PLT 275   ------------------------------------------------------------------------------------------------------------------ Chemistries  Recent Labs  Lab 11/06/18 0455  NA 135  K 4.3  CL 96*  CO2 28  GLUCOSE 114*  BUN 16  CREATININE 0.92  CALCIUM 8.7*   RADIOLOGY:  Dg Chest Portable 1 View  Result Date: 11/05/2018 CLINICAL DATA:  Shortness of breath EXAM: PORTABLE CHEST 1 VIEW COMPARISON:  03/30/2018 FINDINGS: The heart size is enlarged. There are prominent interstitial lung markings bilaterally. There is no pneumothorax. No acute osseous abnormality. Chronic changes of the distal right clavicle are again noted. There may be trace bilateral pleural effusions. Aortic calcifications are noted. IMPRESSION: Cardiomegaly with findings suspicious for developing pulmonary edema. Electronically Signed   By: Constance Holster M.D.   On: 11/05/2018 20:07   ASSESSMENT AND PLAN:   58 y.o. male PMH of COPD, CHF, ASCVD, PAD  s/p right BKA who is in today for follow up of recent admission for LLE cellulitis presenting with leg swelling and  worsening shortness of breath  1. Acute on chronic Congestive Heart Failure: Acute presentation likely due to volume overload with associated symptoms of SOB, BLE edema and abdominal distension. BNP elevated at 1399  Ischemic Cardiomyopathy.  Last Echo 12/2017 with  EF 30-35% - Afterload, Goal MAP <70: Restart Lisinopril as BP allows - Beta-Blockade: Metoprolol - Spironolactone (EF<35% and GFR>30) and Imdur - Diuretics: Furosemide 40mg  IV BID. Diureses >1L negative per day until approach euvolemia / worsening renal function. - Echocardiogram pending - Low salt diet  - Check daily weight - Strict I&Os - CHF Teaching - Cardiology input appreciated, will continue to follow with them  2. Coronary artery disease s/p DES 2013 and 2014 Most recent [place] 04/2017 at OSH w/ Prox RCA 70%, Mid RCA 80%, ostial D1 80%, Mid LAD, 60%, Prox LCx diffuse 90%, M1 85%. LVEDP 34 mmHg - ASA 81mg  PO daily - Clopidogrel 75mg  PO daily  - HTN, HLD, DM control as below - Continues to have stable Angina with activity. Two recent NSTEMIs and sepsis evaluated for revascularization at [facility] but at high-risk for complications. - Cardiology  following  3. HLD = + Goal LDL<70 - Atorvastatin 80mg  PO qhs  4. HTN- stable + Goal BP <130/80 - Beta-blocker: Metoprolol - ACE-Inhibitor: Will resume Lisinopril as BP allows  5.  DM - Insulin dependent at home,  on Lantus and Metformin at home. - Last Hemoglobin A1c 05/2018 6.7 - Holding Lantus and Metformin - Titrate up based on ongoing Sliding Scale needs to maintain - Diabetes educator to follow  6. COPD-  No signs of exacerbation - patient with hx of COPD uses home oxygen at 2L intermittently - Supplemental O2, goal sat 88-92% - Bronchodilators (albuterol/ipratropium) standing and PRN   7. DVT prophylaxis - Lovenox   All the records are reviewed and case discussed with Care Management/Social Worker. Management plans discussed with the patient, family and they are in  agreement.  CODE STATUS: Full Code  TOTAL TIME TAKING CARE OF THIS PATIENT: 40 minutes.   More than 50% of the time was spent in counseling/coordination of care: YES  POSSIBLE D/C IN 1-2 DAYS, DEPENDING ON CLINICAL CONDITION.   on 11/06/2018 at 11:20 AM  This patient was staffed with Dr. Vianne Bulls, Lise Auer who personally evaluated patient, reviewed documentation and agreed with assessment and plan of care as above.  Rufina Falco, DNP, FNP-BC [facility] Nurse Practitioner   Between 7am to 6pm - Pager [phone]  After 6pm go to [email or web address] - [employer] [facility]  Office  [phone]  CC: Primary care physician; Clarisse Gouge, MD  Note: This dictation was prepared with Dragon dictation along with smaller phrase technology. Any transcriptional errors that result from this process are unintentional.

## 2018-11-06 NOTE — Consult Note (Signed)
Cardiology Consultation:   Patient ID: [name] [name] MRN: [id number]; DOB: 05/26/1961  Admit date: 11/05/2018 Date of Consult: 11/06/2018  Primary Care Provider: Clarisse Gouge, MD Primary Cardiologist: [facility] in [place], [name] [name] Primary Electrophysiologist:  None    Patient Profile:   [name] [name] is a 58 y.o. 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 Jannifer [name].  History of Present Illness:   [name] [name] is a 58 year old male with PMH as above.  Has has been followed by both [facility] and [place] in the past. He has CAD s/p PCI to the LAD in 2013 at [facility] and s/p PCI/DES to RCA in 03/2013. LHC in 2015 showed patent stents with nonobstructive disease.  Ischemic evaluation in 2016 via nuclear stress test showed no evidence of ischemia.  04/2017 cardiac cath showed severe 3-vessel CAD and patent stents to the LAD / RCA, severe pRCA stenosis, diffuse mid and distal dz, significant stenosis of pLCx and OM1, and moderate LAD disease.  The coronaries were noted to be healthily calcified.  Discussion has been ongoing regarding candidacy for CABG at [facility] with cardiac MRI per [facility] as copied and pasted below 08/2017. Echo 12/2017 showed EF 30-35%, diffuse hypokinesis, mild LAE, mild to moderate RV enlargement. PASP 40-29mmHg.  Over the past 2 weeks, the patient has reported progressive shortness of breath, lower extremity edema, and abdominal tightness.  He also reported orthopnea, which has been stable and two-pillow orthopnea when sleeping  on a couch. He reported that he approximately 3 L of fluid daily.  He admits to eating out at [facility] at least once weekly and does not indicate any diet restrictions.   He is status post right BKA and therefore gets around with a wheelchair and has noticed progressive shortness of breath during this activity as well.  He reported that despite his medication compliance, he was unable to alleviate this volume overload.    He presented to [facility] emergency department on 11/06/2018.  He continued to note progressive shortness of breath, LAE, abdominal tightness.  He reported chest tightness or a band across his chest and made worse with deep breathing.  He did note that this chest tightness alleviated with his nitroglycerin patch; however, he had to stop using it this past week due to skin itching / dermatitis located in area of patch placement (now marked in chart).  He denied palpitations, and racing heart rate.  Reported syncope or presyncope.  EKG sinus tachycardia at 106 bpm, PVCs, LAD/LAFB, RBBB. In the ED, vitals were significant for BP 119/69, HR 107 bpm, 98% on RA, weight 104.3 kg and unchanged from 10/15/2018 weight.  Labs showed Na 132, K 4.2, Scr elevated from baseline 0.92  1.02, Ca++ 8.6, BNP 1399.0, troponin negative, WBC 7.0, Hgb low but stable at 10.9, HCT 36.9, platelets 289.  COVID-19 negative.  CXR showed cardiomegaly with findings suspicious for developing pulmonary edema.  He was started on IV Lasix and admitted with cardiology consulted for further management.  Of note, patient has reported a skin rash allergy to  his nitroglycerin patch as of this admission.    Past Medical History:  Diagnosis Date  . Arthritis   . Asthma   . Atherosclerosis   . BPH (benign prostatic hyperplasia)   . Carotid arterial disease ([place])   . Charcot's joint of foot, right   . Chronic combined systolic (congestive) and diastolic (congestive) heart failure ([place])    a. 02/2013 EF 50% by LV gram; b. 04/2017  Echo: EF 25-30%. diff HK. Gr2 DD. Mod dil LA/RV. PASP 29mmHg.  [name] COPD (chronic obstructive pulmonary disease) ([place])   . Coronary artery disease    a. 2013 S/P PCI of LAD [facility]);  b. 03/2013 PCI: RCA 90p ( 2.5 x 23 mm DES); c. 02/2014 Cath: patent RCA stent->Med Rx; d. 04/2017 Cath: LM nl, LAd 30ost/p, 22m/d, D1 80ost, LCX 90p/m, OM1 85, RCA 70/20p, 49m, 70d->Referred for CT Surg-felt to be poor candidate.  . Diabetic neuropathy ([place])   . Diabetic ulcer of right foot ([place])   . Hernia   . Hyperlipidemia   . Hypertension   . Ischemic cardiomyopathy    a. 04/2017 Echo: EF 25-30%; b. 08/2017 Cardiac MRI ([facility]): EF 19%, sev glob HK. RVEF 20%, mod BAE, triv MR, mild-mod TR. Basal lateral subendocardial infarct (viable), inf/infsept, dist septal ischemia, basal to mid lat peri-infarct ischemia.  . Morbid obesity ([place])   . Neuropathy   . PAD (peripheral artery disease) ([place])    a. Followed by Dr. Lucky Cowboy; b. 08/2010 Periph Angio: RSFA 70-80p (6X60 self-expanding stent); c. 09/2010 Periph Angio: L SFA 100p (7x unknown length self-expanding stent); d. 06/2015 Periph Angio: R SFA short segment occlusion (6x12 self-expanding stent).  . Restless leg syndrome   . Sleep apnea   . Subclavian artery stenosis, right ([place])   . Syncope and collapse   . Tobacco use    a. 75+ yr hx - still smoking 1ppd, down from 2 ppd.  . Type II diabetes mellitus ([place])   . Varicose vein     Past Surgical History:  Procedure Laterality Date  . ABDOMINAL AORTAGRAM N/A 06/23/2013   Procedure: ABDOMINAL Maxcine Ham;  Surgeon: Wellington Hampshire, MD;  Location: [place] CATH LAB;  Service: Cardiovascular;  Laterality: N/A;  . AMPUTATION Right 04/01/2018   Procedure: AMPUTATION BELOW KNEE;  Surgeon: Algernon Huxley, MD;  Location: [facility] ORS;  Service: Vascular;  Laterality: Right;  . APPENDECTOMY    . CARDIAC CATHETERIZATION  10/14   [facility]; X1 STENT PROXIMAL RCA  . CARDIAC CATHETERIZATION  02/2010   [facility]  . CARDIAC CATHETERIZATION  02/21/2014   [facility]   . CLAVICLE SURGERY    . HERNIA REPAIR    . IRRIGATION AND DEBRIDEMENT FOOT Right 01/04/2018   Procedure: IRRIGATION AND DEBRIDEMENT FOOT;  Surgeon: Samara Deist, DPM;  Location: [facility] ORS;  Service: Podiatry;  Laterality: Right;  . LEFT HEART CATH AND CORS/GRAFTS ANGIOGRAPHY N/A 04/28/2017   Procedure: LEFT HEART CATH AND CORONARY ANGIOGRAPHY;  Surgeon: Wellington Hampshire, MD;  Location: [place] CV LAB;  Service: Cardiovascular;  Laterality: N/A;  . LOWER EXTREMITY ANGIOGRAPHY Right 03/03/2018   Procedure: LOWER EXTREMITY ANGIOGRAPHY;  Surgeon: Katha Cabal, MD;  Location: [place] CV LAB;  Service: Cardiovascular;  Laterality: Right;  . LOWER EXTREMITY ANGIOGRAPHY Left 08/24/2018   Procedure: LOWER EXTREMITY ANGIOGRAPHY;  Surgeon: Algernon Huxley, MD;  Location: [place] CV LAB;  Service: Cardiovascular;  Laterality: Left;  . PERIPHERAL ARTERIAL STENT GRAFT     x2 left/right  . PERIPHERAL  VASCULAR BALLOON ANGIOPLASTY Right 01/06/2018   Procedure: PERIPHERAL VASCULAR BALLOON ANGIOPLASTY;  Surgeon: Katha Cabal, MD;  Location: [place] CV LAB;  Service: Cardiovascular;  Laterality: Right;  . PERIPHERAL VASCULAR CATHETERIZATION N/A 07/06/2015   Procedure: Abdominal Aortogram w/Lower Extremity;  Surgeon: Algernon Huxley, MD;  Location: [place] CV LAB;  Service: Cardiovascular;  Laterality: N/A;  . PERIPHERAL VASCULAR CATHETERIZATION  07/06/2015   Procedure: Lower Extremity Intervention;  Surgeon: Algernon Huxley, MD;  Location: [place] CV LAB;  Service: Cardiovascular;;     Home Medications:  Prior to Admission medications   Medication Sig Start Date End Date Taking? Authorizing Provider  aspirin EC 81 MG tablet Take 1 tablet (81 mg total) by mouth daily. 05/11/15   Wellington Hampshire, MD  atorvastatin (LIPITOR) 80 MG tablet Take 80 mg by mouth daily.    [provider]  calcium carbonate (OS-CAL) 1250 (500 Ca) MG chewable tablet Chew by mouth.    [provider]  calcium carbonate (TUMS - DOSED IN MG ELEMENTAL CALCIUM) 500 MG chewable tablet Chew 4 tablets by mouth daily as needed for indigestion or heartburn.    [provider]  clopidogrel (PLAVIX) 75 MG tablet Take 75 mg by mouth daily.  08/22/17   [provider]  diphenhydrAMINE (BENADRYL) 25 MG tablet Take 25 mg by mouth daily as needed for allergies.    [provider]  doxycycline (VIBRAMYCIN) 100 MG capsule Take 1 capsule (100 mg total) by mouth 2 (two) times daily. 08/19/18   Kris Hartmann, NP  ezetimibe (ZETIA) 10 MG tablet Take 10 mg by mouth daily.    [provider]  fluticasone furoate-vilanterol (BREO ELLIPTA) 100-25 MCG/INH AEPB Inhale 1 puff into the lungs daily.  04/14/18   [provider]  furosemide (LASIX) 40 MG tablet Take 40 mg by mouth daily. May take twice daily only if needed 01/15/18   [provider]  gabapentin (NEURONTIN) 800 MG tablet Take 800 mg by mouth 4 (four) times daily.  08/14/17 10/15/18  [provider]  Insulin Glargine (LANTUS SOLOSTAR) 100 UNIT/ML Solostar Pen Inject 30 Units into the skin at bedtime. 04/07/18   Mayo, Pete Pelt, MD  insulin lispro (HUMALOG) 100 UNIT/ML injection Sliding Scale Insulin before meals using Novolog If BS is 0 to 70 no insulin and eat/glucose tablets/glucose get If BS 71 to 150 great! If BS 151 to 200, 2 Units Novolog If BS 201 to 250, 4 Units Novolog If BS 251 to 300, 6 Units Novolog If BS 301 to 350, 8 Units Novolog If BS 351 to 400, 10 Units Novolog If BS greater than 400, take 10 Units and Contact our office during office hour 04/14/18   [provider]  ipratropium-albuterol (DUONEB) 0.5-2.5 (3) MG/3ML SOLN Inhale 3 mLs into the lungs every 6 (six) hours as needed (wheezing/sob).     [provider]  magnesium oxide (MAG-OX) 400 MG tablet Take 400 mg by mouth daily.  04/16/18 04/16/19  [provider]  metFORMIN (GLUCOPHAGE-XR) 500 MG 24 hr  tablet Take 500 mg by mouth 2 (two) times daily.     [provider]  metoprolol succinate (TOPROL-XL) 25 MG 24 hr tablet Take 25 mg by mouth daily.  03/19/18 03/19/19  [provider]  Multiple Vitamin (MULTI-VITAMINS) TABS Take 1 tablet by mouth daily.     [provider]  multivitamin-lutein (OCUVITE-LUTEIN) CAPS capsule Take 1 capsule by mouth daily. 01/09/18  Demetrios Loll, MD  nitroGLYCERIN (NITROSTAT) 0.4 MG SL tablet Place 0.4 mg under the tongue every 5 (five) minutes as needed for chest pain.    [provider]  Oxycodone HCl 10 MG TABS Take 10 mg by mouth every 6 (six) hours as needed (pain).  01/15/18   [provider]  potassium chloride (K-DUR) 10 MEQ tablet Take 10 mEq by mouth daily.  02/24/18 02/24/19  [provider]  promethazine (PHENERGAN) 25 MG tablet TAKE 1 TABLET BY MOUTH EVERY 6 HOURS AS NEEDED NAUSEA 04/29/18   [provider]  ranitidine (ZANTAC) 150 MG tablet Take 300 mg by mouth daily as needed for heartburn.    [provider]  spironolactone (ALDACTONE) 25 MG tablet Take 25 mg by mouth daily.  03/02/18   [provider]  traZODone (DESYREL) 50 MG tablet Take 50 mg by mouth at bedtime as needed for sleep.  08/12/17   [provider]    Inpatient Medications: Scheduled Meds: . atorvastatin  80 mg Oral Daily  . clopidogrel  75 mg Oral Daily  . enoxaparin (LOVENOX) injection  40 mg Subcutaneous Q24H  . fluticasone furoate-vilanterol  1 puff Inhalation Daily  . furosemide  40 mg Intravenous BID  . insulin aspart  0-5 Units Subcutaneous QHS  . insulin aspart  0-9 Units Subcutaneous TID WC  . isosorbide mononitrate  30 mg Oral Daily  . metoprolol succinate  25 mg Oral Daily  . spironolactone  25 mg Oral Daily   Continuous Infusions:  PRN Meds: acetaminophen **OR** acetaminophen, ondansetron **OR** ondansetron (ZOFRAN) IV, oxyCODONE, traZODone  Allergies:    Allergies  Allergen  Reactions  . Dulaglutide Anaphylaxis, Diarrhea and Hives  . Prednisone Rash    Social History:   Social History   Socioeconomic History  . Marital status: Single    Spouse name: Not on file  . Number of children: 0  . Years of education: 35  . Highest education level: 12th grade  Occupational History  . Occupation: on disability  Social Needs  . Financial resource strain: Not hard at all  . Food insecurity:    Worry: Never true    Inability: Never true  . Transportation needs:    Medical: No    Non-medical: No  Tobacco Use  . Smoking status: Current Every Day Smoker    Packs/day: 1.00    Years: 41.00    Pack years: 41.00    Types: Cigarettes  . Smokeless tobacco: Never Used  Substance and Sexual Activity  . Alcohol use: No  . Drug use: No  . Sexual activity: Yes  Lifestyle  . Physical activity:    Days per week: 7 days    Minutes per session: 30 min  . Stress: Rather much  Relationships  . Social connections:    Talks on phone: More than three times a week    Gets together: Once a week    Attends religious service: Never    Active member of club or organization: No    Attends meetings of clubs or organizations: Never    Relationship status: Living with partner  . Intimate partner violence:    Fear of current or ex partner: No    Emotionally abused: No    Physically abused: No    Forced sexual activity: No  Other Topics Concern  . Not on file  Social History Narrative   Lives at home in [place] with girlfriend. Independent at baseline.  Family History:    Family History  Problem Relation Age of Onset  . Heart attack Father   . Heart disease Father   . Hypertension Father   . Hyperlipidemia Father   . Hypertension Mother   . Hyperlipidemia Mother      ROS:  Please see the history of present illness.   Review of Systems  Constitutional: Negative for chills, diaphoresis, fever and weight loss.  Respiratory: Positive for shortness of breath  and wheezing. Negative for hemoptysis.        Progressive shortness of breath over the last 2 weeks.  Cardiovascular: Positive for chest pain, orthopnea and leg swelling. Negative for palpitations.       Pleuritic chest pain over last 2 weeks described as a band or constant tightness across his anterior chest.  Bilateral lower extremity edema and abdominal tightness.  Chronic two-pillow orthopnea and sleeps on tabs nightly.  Musculoskeletal: Negative for falls.  Neurological: Negative for loss of consciousness. Focal weakness:    Psychiatric/Behavioral: Negative for substance abuse.  All other systems reviewed and are negative.   All other ROS reviewed and negative.     Physical Exam/Data:   Vitals:   11/06/18 0024 11/06/18 0442 11/06/18 0444 11/06/18 0500  BP: 111/90 95/82 126/85   Pulse: 99 (!) 39 94   Resp: 18     Temp: 98.3 F (36.8 C)     TempSrc: Oral     SpO2: 100% 97% 100%   Weight: 106.5 kg   106.5 kg  Height:        Intake/Output Summary (Last 24 hours) at 11/06/2018 0751 Last data filed at 11/06/2018 0727 Gross per 24 hour  Intake -  Output 2075 ml  Net -2075 ml   Filed Weights   11/05/18 1839 11/06/18 0024 11/06/18 0500  Weight: 104.3 kg 106.5 kg 106.5 kg   Body mass index is 36.77 kg/m.  General:  Well nourished, well developed, in no acute distress HEENT: normal Neck: JVD difficult to assess due to body habitus Vascular: Radial pulses 2+ bilaterally   Cardiac:  normal S1, S2; RRR; no murmur  Lungs: Reduced lung sounds, diffuse bilateral wheezing Abd: Tight, slightly distended, nontender, no hepatomegaly  Ext: 1+ bilateral L lower extremity edema, extending up through the shin. S/p RBKA.  Bilateral erythema.  Left lower extremity skin changes consistent with venous insufficiency. Musculoskeletal: S/p R BKA Skin: warm and dry  Neuro:  no focal abnormalities noted Psych:  Normal affect   EKG: EKG sinus tachycardia at 106 bpm, PVCs, LAD/LAFB, RBBB.  Telemetry:  Telemetry was personally reviewed and demonstrates:  Leads currently off patient  CV Studies:   Relevant CV Studies:  January 09, 2018 TTE Study Conclusions - Left ventricle: The cavity size was moderately dilated. Wall   thickness was increased in a pattern of mild LVH. Systolic   function was moderately to severely reduced. The estimated   ejection fraction was in the range of 30% to 35%. Diffuse   hypokinesis. Regional wall motion abnormalities cannot be   excluded. Doppler parameters are consistent with restrictive   physiology, indicative of decreased left ventricular diastolic   compliance and/or increased left atrial pressure. Doppler   parameters are consistent with high ventricular filling pressure. - Mitral valve: Calcified annulus. Mildly thickened leaflets . - Left atrium: The atrium was mildly dilated. - Right ventricle: The cavity size was mildly to moderately   dilated. Systolic function was normal. - Right atrium: The atrium was mildly dilated. - Pulmonary  arteries: Systolic pressure was mildly to moderately   increased, in the range of 40 mm Hg to 45 mm Hg.  08/2017 Cardiac MRI: [facility] 1. The left ventricle is moderately dilated in cavity size with normal wall thickness. Global systolic function is severely reduced with an LV ejection fraction calculated at 19%. There is severe hypokinesis globally. 2. The right ventricle is moderately dilated in cavity size with normal wall thickness. Global RV systolic function is severely reduced with an RVEF calculated at 20%.  3. Both atria are moderately enlarged in size. 4. The aortic valve is trileaflet in morphology. There is no significant aortic valve stenosis or regurgitation.  5. There is trivial mitral regurgitation. There is mild-moderate tricuspid regurgitation. 6. Delayed enhancement imaging is mildly abnormal. There is a small thin layer of subendocardial infarction in the basal lateral wall.  There is significant viability in all coronary territories. 7. Adenosine stress perfusion imaging is abnormal with stress perfusion defects indicating inducible myocardial ischemia in: a) the entire inferior and inferoseptal walls, consistent with the RCA perfusion territory. b) the basal-to-mid lateral wall, suggestive of peri-infarct ischemia in the LCx perfusion territory. c) the distal septal wall, consistent with the distal LAD territory. 8. No intra-cardiac thrombus visualized. 9. There is a small circumferential pericardial effusion. There are small bilateral pleural effusions.  LHC 04/2017  Prox RCA-1 lesion is 70% stenosed.  Prox RCA-2 lesion is 20% stenosed.  Mid RCA lesion is 80% stenosed.  Mid RCA to Dist RCA lesion is 70% stenosed.  Ost LAD to Prox LAD lesion is 30% stenosed.  Ost 1st Diag to 1st Diag lesion is 80% stenosed.  Prox LAD to Mid LAD lesion is 60% stenosed.  Dist LAD lesion is 60% stenosed.  Prox Cx to Mid Cx lesion is 90% stenosed.  Ost 1st Mrg to 1st Mrg lesion is 85% stenosed. 1.  Significant three-vessel coronary artery disease with patent stent in the RCA and LAD.  There is significant proximal RCA disease before the stent as well as diffuse mid and distal disease in a vessel that appears to be about 2.5 mm in diameter.  There is significant bifurcation stenosis in the proximal left circumflex with a large OM1.  The LAD has moderate disease. The coronary arteries are moderately calcified and diffusely diseased throughout. 2.  Left ventricular angiography was not performed.  EF was moderately to severely reduced by echo. 3.  Severely elevated left ventricular end-diastolic pressure at 34 mmHg. Recommendations: This is overall a difficult situation.  Revascularization options are somewhat limited due to diffuse disease overall.  Given that he is diabetic and has cardiomyopathy, best option might be CABG if he is found to be a candidate.  We need to  optimize his heart failure first with outpatient referral to cardiothoracic surgery for evaluation.  I am going to increase intravenous diuresis today and the patient possibly can be discharged home tomorrow on medical therapy.  Laboratory Data:  Chemistry Recent Labs  Lab 11/05/18 1844 11/06/18 0455  NA 132* 135  K 4.2 4.3  CL 95* 96*  CO2 27 28  GLUCOSE 142* 114*  BUN 14 16  CREATININE 1.02 0.92  CALCIUM 8.6* 8.7*  GFRNONAA >60 >60  GFRAA >60 >60  ANIONGAP 10 11    No results for input(s): PROT, ALBUMIN, AST, ALT, ALKPHOS, BILITOT in the last 168 hours. Hematology Recent Labs  Lab 11/05/18 1844 11/06/18 0455  WBC 7.0 6.7  RBC 5.66 5.72  HGB 10.9* 10.9*  HCT 36.9* 37.5*  MCV 65.2* 65.6*  MCH 19.3* 19.1*  MCHC 29.5* 29.1*  RDW 20.7* 20.7*  PLT 289 275   Cardiac Enzymes Recent Labs  Lab 11/05/18 1844  TROPONINI <0.03   No results for input(s): TROPIPOC in the last 168 hours.  BNP Recent Labs  Lab 11/05/18 1844  BNP 1,399.0*    DDimer No results for input(s): DDIMER in the last 168 hours.  Radiology/Studies:  Dg Chest Portable 1 View  Result Date: 11/05/2018 CLINICAL DATA:  Shortness of breath EXAM: PORTABLE CHEST 1 VIEW COMPARISON:  03/30/2018 FINDINGS: The heart size is enlarged. There are prominent interstitial lung markings bilaterally. There is no pneumothorax. No acute osseous abnormality. Chronic changes of the distal right clavicle are again noted. There may be trace bilateral pleural effusions. Aortic calcifications are noted. IMPRESSION: Cardiomegaly with findings suspicious for developing pulmonary edema. Electronically Signed   By: Constance Holster M.D.   On: 11/05/2018 20:07    Assessment and Plan:   Acute on chronic combined systolic and diastolic heart failure (EF 30-35%), ICM - Continues to report shortness of breath and bilateral lower extremity edema and abdominal tightness. - Volume overloaded on exam. BNP elevated at 1399.0. -  Continue IV diuresis with Lasix. - Continue to monitor I's/O, daily standing weights. -2L since admission and -1.65L yesterday. Weight up ~5Lbs from baseline. - Daily BMET to monitor renal function. Replete electrolytes as needed with K goal 4.0, Mg goal 2.0. - CHF education. - Damar oxygen as needed and wean as tolerated. -Further recommendations pending updated echocardiogram / EF. Previous echo 12/2017 with EF 30-35% and PASP 40-45%. - Continue medical management with Toprol and spironolactone. - Consider restart of lisinopril as BP allows, which was stopped during a previous admission before peripheral angiography and to be restarted if renal function was stable following the procedure but not yet restarted.  Sinus Tachycardia - EKG as above. Remains tachycardic in low 100s. Asx. - Continue current Toprol XL for now as would not increase dose at this time given soft BP. Continue to monitor as may improve with diuresis.  CAD s/p PCI (considered for CABG at [facility]) - Ongoing pleuritic chest tightness x2 weeks/reported volume overload and worse from his baseline. Stopped using the nitro patch d/t skin dermatitis /itching reported with nitroglycerin patch, which will add to allergies. - Followed by [facility] in [place] as well with consideration of CABG as above.  - Pending echo. - Troponin negative.  EKG without acute changes. R/o for ACS. No ischemic workup planned at this time. No indication for heparin. - Started on Imdur 30 mg daily in place of patch given intolerance above. Continue medical management with ASA, Plavix, Coreg, Lipitor, spironolactone, and Imdur. - Consider restart of lisinopril as BP allows as stopped during a previous admission.   DM2 - SSI - Per IM  HTN - Currently controlled with at times soft BP. Continue to monitor. Continue BB. Consider restart of lisinopril as BP allows as above.  HLD - Continue statin  Current smoker/tobacco abuse with COPD - 2 packs  daily. -Cessation advised - O2 as needed. Nicotine patch as needed.   For questions or updates, please contact [place] Please consult [email or web address] for contact info under     Signed, Arvil Chaco, PA-C  11/06/2018 7:51 AM

## 2018-11-06 NOTE — Progress Notes (Signed)
*  PRELIMINARY RESULTS* Echocardiogram 2D Echocardiogram has been performed.  [place] 11/06/2018, 9:59 AM

## 2018-11-07 LAB — GLUCOSE, CAPILLARY
Glucose-Capillary: 115 mg/dL — ABNORMAL HIGH (ref 70–99)
Glucose-Capillary: 243 mg/dL — ABNORMAL HIGH (ref 70–99)
Glucose-Capillary: 180 mg/dL — ABNORMAL HIGH (ref 70–99)
Glucose-Capillary: 139 mg/dL — ABNORMAL HIGH (ref 70–99)

## 2018-11-07 MED ORDER — INSULIN GLARGINE 100 UNIT/ML ~~LOC~~ SOLN
20.0000 [IU] | Freq: Every day | SUBCUTANEOUS | Status: DC
  Administered 2018-11-07 – 2018-11-08 (×2): 20 [IU] via SUBCUTANEOUS
  Filled 2018-11-07 (×3): qty 0.2

## 2018-11-07 NOTE — Plan of Care (Signed)
  Problem: Education: Goal: Ability to demonstrate management of disease process will improve Outcome: Progressing   Problem: Pain Managment: Goal: General experience of comfort will improve Outcome: Progressing   Problem: Safety: Goal: Ability to remain free from injury will improve Outcome: Progressing

## 2018-11-07 NOTE — Progress Notes (Addendum)
[facility] [place] NAME: [name] [name]    MR#:  [id number]  DATE OF BIRTH:  [birth year]  SUBJECTIVE:   Chief Complaint  Patient presents with  . Leg Swelling    Patient's shortness of breath is improved denies any chest pain  REVIEW OF SYSTEMS:  Review of Systems  Constitutional: Negative for chills, fever, malaise/fatigue and weight loss.  HENT: Negative for congestion, hearing loss and sore throat.   Eyes: Negative for blurred vision and double vision.  Respiratory: Positive for shortness of breath and wheezing. Negative for cough.   Cardiovascular: Positive for leg swelling. Negative for chest pain, palpitations and orthopnea.  Gastrointestinal: Negative for abdominal pain, diarrhea, nausea and vomiting.  Genitourinary: Negative for dysuria and urgency.  Musculoskeletal: Negative for myalgias.  Skin: Negative for rash.  Neurological: Negative for dizziness, sensory change, speech change, focal weakness and headaches.  Psychiatric/Behavioral: Negative for depression.   DRUG ALLERGIES:   Allergies  Allergen Reactions  . Dulaglutide Anaphylaxis, Diarrhea and Hives  . Other Itching    Skin itching associated with nitro patch  . Prednisone Rash   VITALS:  Blood pressure 115/79, pulse 93, temperature 97.6 F (36.4 C), temperature source Oral, resp. rate 19, height 5\' 7"  (1.702 m), weight 103.7 kg, SpO2 98 %. PHYSICAL EXAMINATION:   GENERAL:  58 y.o.-year-old patient lying in the bed with no acute distress.  EYES: Pupils equal, round, reactive to light and accommodation. No scleral icterus. Extraocular muscles intact.  HEENT: Head atraumatic, normocephalic. Oropharynx and nasopharynx clear.  NECK:  Supple, no jugular venous distention. No thyroid enlargement, no tenderness.  LUNGS: Crackles at the bases . no crepitation. No use of accessory muscles of respiration.  CARDIOVASCULAR: S1, S2 normal. No murmurs, rubs, or gallops.   ABDOMEN: Soft, nontender, nondistended. Bowel sounds present. No organomegaly or mass.  EXTREMITIES: Positive left lower extremity swelling  Right lower extremity s/p BKA NEUROLOGIC: Cranial nerves II through XII are intact. Muscle strength 5/5 in all extremities. Sensation intact. Gait not checked.  PSYCHIATRIC: The patient is alert and oriented x 3.  SKIN: No obvious rash, lesion, or ulcer.   DATA REVIEWED:  LABORATORY PANEL:  Male CBC Recent Labs  Lab 11/06/18 0455  WBC 6.7  HGB 10.9*  HCT 37.5*  PLT 275   ------------------------------------------------------------------------------------------------------------------ Chemistries  Recent Labs  Lab 11/06/18 0455  NA 135  K 4.3  CL 96*  CO2 28  GLUCOSE 114*  BUN 16  CREATININE 0.92  CALCIUM 8.7*   RADIOLOGY:  Dg Chest Portable 1 View  Result Date: 11/05/2018 CLINICAL DATA:  Shortness of breath EXAM: PORTABLE CHEST 1 VIEW COMPARISON:  03/30/2018 FINDINGS: The heart size is enlarged. There are prominent interstitial lung markings bilaterally. There is no pneumothorax. No acute osseous abnormality. Chronic changes of the distal right clavicle are again noted. There may be trace bilateral pleural effusions. Aortic calcifications are noted. IMPRESSION: Cardiomegaly with findings suspicious for developing pulmonary edema. Electronically Signed   By: Constance Holster M.D.   On: 11/05/2018 20:07   ASSESSMENT AND PLAN:   58 y.o. male PMH of COPD, CHF, ASCVD, PAD s/p right BKA who is in today for follow up of recent admission for LLE cellulitis presenting with leg swelling and worsening shortness of breath  1. Acute on chronic Congestive Heart Failure: Acute presentation likely due to volume overload with associated symptoms of SOB, BLE edema and abdominal distension. BNP elevated at 1399  Ischemic Cardiomyopathy.  Last Echo 12/2017 with  EF 30-35% Continue metoprolol Continue Spironolactone (EF<35% and GFR>30) and Imdur  Continue diuretics: Furosemide 40mg  IV BID.   Appreciate cardiology input  2. Coronary artery disease s/p DES 2013 and 2014 Most recent [place] 04/2017 at OSH w/ Prox RCA 70%, Mid RCA 80%, ostial D1 80%, Mid LAD, 60%, Prox LCx diffuse 90%, M1 85%. LVEDP 34 mmHg - ASA 81mg  PO daily - Clopidogrel 75mg  PO daily  - HTN, HLD, DM control as below -Patient will need follow-up with cardiology at [facility]  3. HLD = + Goal LDL<70 - Atorvastatin 80mg  PO qhs  4. HTN- stable + Goal BP <130/80 - Beta-blocker: Metoprolol - ACE-Inhibitor: Lisinopril on hold  5.  DM - Insulin dependent at home,  on Lantus and Metformin at home. - Last Hemoglobin A1c 05/2018 6.7 -Resume Lantus - Titrate up based on ongoing Sliding Scale needs to maintain - Diabetes educator to follow  6. COPD-  No signs of exacerbation - patient with hx of COPD uses home oxygen at 2L intermittently - Supplemental O2, goal sat 88-92% - Bronchodilators (albuterol/ipratropium) standing and PRN   7. DVT prophylaxis - Lovenox   All the records are reviewed and case discussed with Care Management/Social Worker. Management plans discussed with the patient, family and they are in agreement.  CODE STATUS: Full Code  TOTAL TIME TAKING CARE OF THIS PATIENT: 42minutes.   More than 50% of the time was spent in counseling/coordination of care: YES  POSSIBLE D/C IN 1-2 DAYS, DEPENDING ON CLINICAL CONDITION.   on 11/07/2018 at 3:11 PM    Between 7am to 6pm - Pager - [phone]  After 6pm go to [email or web address] - [employer] [facility]  Office  [phone]  CC: Primary care physician; Clarisse Gouge, MD  Note: This dictation was prepared with Dragon dictation along with smaller phrase technology. Any transcriptional errors that result from this process are unintentional.

## 2018-11-07 NOTE — Progress Notes (Signed)
Subjective:  Breathing better no chest pain Did not bring his prosthetic leg to hospital  Objective:  Vitals:   11/06/18 2259 11/07/18 0514 11/07/18 0721 11/07/18 0805  BP: 106/68 110/77  115/79  Pulse: 86 91  93  Resp:  16  19  Temp:  98.2 F (36.8 C)  97.6 F (36.4 C)  TempSrc:  Oral  Oral  SpO2:  90% 92% 98%  Weight:  103.7 kg    Height:        Intake/Output from previous day:  Intake/Output Summary (Last 24 hours) at 11/07/2018 0950 Last data filed at 11/07/2018 0800 Gross per 24 hour  Intake 477 ml  Output 2850 ml  Net -2373 ml    Physical Exam: Chronically ill white male JVP up No murmur PMI enlarged with RV lift Positive HJR Post RLE Below knee amputation Plus 2 brawny indurated edema LLE Lungs with basilar rales   Lab Results: Basic Metabolic Panel: Recent Labs    11/05/18 1844 11/06/18 0455  NA 132* 135  K 4.2 4.3  CL 95* 96*  CO2 27 28  GLUCOSE 142* 114*  BUN 14 16  CREATININE 1.02 0.92  CALCIUM 8.6* 8.7*   Liver Function Tests: No results for input(s): AST, ALT, ALKPHOS, BILITOT, PROT, ALBUMIN in the last 72 hours. No results for input(s): LIPASE, AMYLASE in the last 72 hours. CBC: Recent Labs    11/05/18 1844 11/06/18 0455  WBC 7.0 6.7  HGB 10.9* 10.9*  HCT 36.9* 37.5*  MCV 65.2* 65.6*  PLT 289 275   Cardiac Enzymes: Recent Labs    11/05/18 1844  TROPONINI <0.03   BNP: Invalid input(s): POCBNP D-Dimer: No results for input(s): DDIMER in the last 72 hours. Hemoglobin A1C: No results for input(s): HGBA1C in the last 72 hours. Fasting Lipid Panel: No results for input(s): CHOL, HDL, LDLCALC, TRIG, CHOLHDL, LDLDIRECT in the last 72 hours. Thyroid Function Tests: No results for input(s): TSH, T4TOTAL, T3FREE, THYROIDAB in the last 72 hours.  Invalid input(s): FREET3 Anemia Panel: No results for input(s): VITAMINB12, FOLATE, FERRITIN, TIBC, IRON, RETICCTPCT in the last 72 hours.  Imaging: Dg Chest Portable 1 View   Result Date: 11/05/2018 CLINICAL DATA:  Shortness of breath EXAM: PORTABLE CHEST 1 VIEW COMPARISON:  03/30/2018 FINDINGS: The heart size is enlarged. There are prominent interstitial lung markings bilaterally. There is no pneumothorax. No acute osseous abnormality. Chronic changes of the distal right clavicle are again noted. There may be trace bilateral pleural effusions. Aortic calcifications are noted. IMPRESSION: Cardiomegaly with findings suspicious for developing pulmonary edema. Electronically Signed   By: Constance Holster M.D.   On: 11/05/2018 20:07    Cardiac Studies:  ECG: SR PVC RBBB   Telemetry:  NSR 11/07/2018   Echo: 11/06/18   1. The left ventricle has severely reduced systolic function, with an ejection fraction of 20-25%. The cavity size was moderately dilated. Indeterminate diastolic filling due to E-A fusion. Left ventricular diffuse hypokinesis.  2. The right ventricle has severely reduced systolic function. The cavity was mildly enlarged. There is no increase in right ventricular wall thickness. Right ventricular systolic pressure is moderately elevated with an estimated pressure of 59.6 mmHg.  3. Left atrial size was mildly dilated.  4. Right atrial size was mildly dilated.  5. The mitral valve is degenerative. Mild thickening of the mitral valve leaflet. Mild calcification of the mitral valve leaflet. There is mild mitral annular calcification present.  6. The tricuspid valve is degenerative.  Tricuspid valve regurgitation is moderate.  7. The aortic valve was not well visualized. Moderate thickening of the aortic valve. Moderate calcification of the aortic valve.  8. The inferior vena cava was dilated in size with <50% respiratory variability.  FINDINGS  Left Ventricle: The left ventricle has severely reduced systolic function, with an ejection fraction of 20-25%. The cavity size was moderately dilated. There is borderline increase in left ventricular wall thickness.  Indeterminate diastolic filling due  to E-A fusion. Left ventricular diffuse hypokinesis. Definity contrast agent was given IV to delineate the left ventricular endocardial borders.  Medications:   . aspirin EC  81 mg Oral Daily  . atorvastatin  80 mg Oral Daily  . clopidogrel  75 mg Oral Daily  . enoxaparin (LOVENOX) injection  40 mg Subcutaneous Q24H  . fluticasone furoate-vilanterol  1 puff Inhalation Daily  . furosemide  40 mg Intravenous BID  . gabapentin  400 mg Oral TID  . insulin aspart  0-5 Units Subcutaneous QHS  . insulin aspart  0-9 Units Subcutaneous TID WC  . ipratropium-albuterol  3 mL Nebulization Q6H  . isosorbide mononitrate  30 mg Oral Daily  . metoprolol succinate  25 mg Oral Daily  . spironolactone  25 mg Oral Daily      Assessment/Plan:   CHF:  Continue iv diuresis responding well with over 2L output.  CAD:  He has not followed closely by [facility] He indicates he would never consider open heart surgery no angina continue beta blockers and nitrates  HLD:  Continue statin  DM:  Discussed low carb diet.  Target hemoglobin A1c is 6.5 or less.  Continue current medications. Neuropathy:  Neurontin restarted   Jenkins Rouge 11/07/2018, 9:50 AM

## 2018-11-08 LAB — GLUCOSE, CAPILLARY
Glucose-Capillary: 88 mg/dL (ref 70–99)
Glucose-Capillary: 128 mg/dL — ABNORMAL HIGH (ref 70–99)
Glucose-Capillary: 126 mg/dL — ABNORMAL HIGH (ref 70–99)
Glucose-Capillary: 133 mg/dL — ABNORMAL HIGH (ref 70–99)

## 2018-11-08 LAB — BASIC METABOLIC PANEL
Anion gap: 8 (ref 5–15)
BUN: 23 mg/dL — ABNORMAL HIGH (ref 6–20)
CO2: 30 mmol/L (ref 22–32)
Calcium: 8.5 mg/dL — ABNORMAL LOW (ref 8.9–10.3)
Chloride: 94 mmol/L — ABNORMAL LOW (ref 98–111)
Creatinine, Ser: 1.09 mg/dL (ref 0.61–1.24)
GFR calc Af Amer: >60 mL/min (ref >60)
GFR calc non Af Amer: >60 mL/min (ref >60)
Glucose, Bld: 97 mg/dL (ref 70–99)
Potassium: 4.2 mmol/L (ref 3.5–5.1)
Sodium: 132 mmol/L — ABNORMAL LOW (ref 135–145)

## 2018-11-08 MED ORDER — IPRATROPIUM-ALBUTEROL 0.5-2.5 (3) MG/3ML IN SOLN: 3.0000 mL | Freq: Once | RESPIRATORY_TRACT | Status: DC

## 2018-11-08 MED ORDER — ALUM & MAG HYDROXIDE-SIMETH 200-200-20 MG/5ML PO SUSP
30.0000 mL | ORAL | Status: DC | PRN
  Administered 2018-11-09: 30 mL via ORAL
  Filled 2018-11-08 (×2): qty 30

## 2018-11-08 NOTE — Plan of Care (Signed)
  Problem: Education: Goal: Ability to demonstrate management of disease process will improve Outcome: Progressing   Problem: Education: Goal: Knowledge of General Education information will improve Description Including pain rating scale, medication(s)/side effects and non-pharmacologic comfort measures Outcome: Progressing   Problem: Clinical Measurements: Goal: Respiratory complications will improve Outcome: Progressing   Problem: Pain Managment: Goal: General experience of comfort will improve Outcome: Progressing   Problem: Safety: Goal: Ability to remain free from injury will improve Outcome: Progressing

## 2018-11-08 NOTE — Progress Notes (Signed)
Subjective:  Breathing better no chest pain Did not bring his prosthetic leg to hospital  Objective:  Vitals:   11/07/18 2002 11/08/18 0518 11/08/18 0748 11/08/18 0829  BP: 114/79 132/88 103/77   Pulse: 93 90 91 87  Resp: 15 14 19 18   Temp: 98.2 F (36.8 C) 98.1 F (36.7 C) 97.6 F (36.4 C)   TempSrc: Oral Oral Oral   SpO2: 95% 100% 93% 94%  Weight:  105.7 kg    Height:        Intake/Output from previous day:  Intake/Output Summary (Last 24 hours) at 11/08/2018 0955 Last data filed at 11/08/2018 0016 Gross per 24 hour  Intake -  Output 1860 ml  Net -1860 ml    Physical Exam: Chronically ill white male JVP up No murmur PMI enlarged with RV lift Positive HJR Post RLE Below knee amputation Plus 2 brawny indurated edema LLE Lungs with basilar rales   Lab Results: Basic Metabolic Panel: Recent Labs    11/06/18 0455 11/08/18 0511  NA 135 132*  K 4.3 4.2  CL 96* 94*  CO2 28 30  GLUCOSE 114* 97  BUN 16 23*  CREATININE 0.92 1.09  CALCIUM 8.7* 8.5*   Liver Function Tests: No results for input(s): AST, ALT, ALKPHOS, BILITOT, PROT, ALBUMIN in the last 72 hours. No results for input(s): LIPASE, AMYLASE in the last 72 hours. CBC: Recent Labs    11/05/18 1844 11/06/18 0455  WBC 7.0 6.7  HGB 10.9* 10.9*  HCT 36.9* 37.5*  MCV 65.2* 65.6*  PLT 289 275   Cardiac Enzymes: Recent Labs    11/05/18 1844  TROPONINI <0.03   BNP: Invalid input(s): POCBNP D-Dimer: No results for input(s): DDIMER in the last 72 hours. Hemoglobin A1C: No results for input(s): HGBA1C in the last 72 hours. Fasting Lipid Panel: No results for input(s): CHOL, HDL, LDLCALC, TRIG, CHOLHDL, LDLDIRECT in the last 72 hours. Thyroid Function Tests: No results for input(s): TSH, T4TOTAL, T3FREE, THYROIDAB in the last 72 hours.  Invalid input(s): FREET3 Anemia Panel: No results for input(s): VITAMINB12, FOLATE, FERRITIN, TIBC, IRON, RETICCTPCT in the last 72 hours.  Imaging: No  results found.  Cardiac Studies:  ECG: SR PVC RBBB   Telemetry:  NSR 11/08/2018   Echo: 11/06/18   1. The left ventricle has severely reduced systolic function, with an ejection fraction of 20-25%. The cavity size was moderately dilated. Indeterminate diastolic filling due to E-A fusion. Left ventricular diffuse hypokinesis.  2. The right ventricle has severely reduced systolic function. The cavity was mildly enlarged. There is no increase in right ventricular wall thickness. Right ventricular systolic pressure is moderately elevated with an estimated pressure of 59.6 mmHg.  3. Left atrial size was mildly dilated.  4. Right atrial size was mildly dilated.  5. The mitral valve is degenerative. Mild thickening of the mitral valve leaflet. Mild calcification of the mitral valve leaflet. There is mild mitral annular calcification present.  6. The tricuspid valve is degenerative. Tricuspid valve regurgitation is moderate.  7. The aortic valve was not well visualized. Moderate thickening of the aortic valve. Moderate calcification of the aortic valve.  8. The inferior vena cava was dilated in size with <50% respiratory variability.  FINDINGS  Left Ventricle: The left ventricle has severely reduced systolic function, with an ejection fraction of 20-25%. The cavity size was moderately dilated. There is borderline increase in left ventricular wall thickness. Indeterminate diastolic filling due  to E-A fusion. Left ventricular diffuse  hypokinesis. Definity contrast agent was given IV to delineate the left ventricular endocardial borders.  Medications:   . aspirin EC  81 mg Oral Daily  . atorvastatin  80 mg Oral Daily  . clopidogrel  75 mg Oral Daily  . enoxaparin (LOVENOX) injection  40 mg Subcutaneous Q24H  . fluticasone furoate-vilanterol  1 puff Inhalation Daily  . furosemide  40 mg Intravenous BID  . gabapentin  400 mg Oral TID  . insulin aspart  0-5 Units Subcutaneous QHS  . insulin aspart   0-9 Units Subcutaneous TID WC  . insulin glargine  20 Units Subcutaneous QHS  . ipratropium-albuterol  3 mL Nebulization Q6H  . isosorbide mononitrate  30 mg Oral Daily  . metoprolol succinate  25 mg Oral Daily  . spironolactone  25 mg Oral Daily      Assessment/Plan:   CHF:  Continue iv diuresis responding well with another liter output. Yesterday  CAD:  He has not followed closely by [facility] He indicates he would never consider open heart surgery no angina continue beta blockers and nitrates  HLD:  Continue statin  DM:  Discussed low carb diet.  Target hemoglobin A1c is 6.5 or less.  Continue current medications. Neuropathy:  Neurontin restarted   Jenkins Rouge 11/08/2018, 9:55 AM

## 2018-11-08 NOTE — Progress Notes (Signed)
[place] [facility] [place] NAME: [name] [name]    MR#:  [id number]  DATE OF BIRTH:  [birth year]  SUBJECTIVE:   Chief Complaint  Patient presents with  . Leg Swelling    Patient's shortness of breath is improved denies any chest pain  REVIEW OF SYSTEMS:  Review of Systems  Constitutional: Negative for chills, fever, malaise/fatigue and weight loss.  HENT: Negative for congestion, hearing loss and sore throat.   Eyes: Negative for blurred vision and double vision.  Respiratory: Positive for shortness of breath and wheezing. Negative for cough.   Cardiovascular: Positive for leg swelling. Negative for chest pain, palpitations and orthopnea.  Gastrointestinal: Negative for abdominal pain, diarrhea, nausea and vomiting.  Genitourinary: Negative for dysuria and urgency.  Musculoskeletal: Negative for myalgias.  Skin: Negative for rash.  Neurological: Negative for dizziness, sensory change, speech change, focal weakness and headaches.  Psychiatric/Behavioral: Negative for depression.   DRUG ALLERGIES:   Allergies  Allergen Reactions  . Dulaglutide Anaphylaxis, Diarrhea and Hives  . Other Itching    Skin itching associated with nitro patch  . Prednisone Rash   VITALS:  Blood pressure 103/77, pulse 87, temperature 97.6 F (36.4 C), temperature source Oral, resp. rate 18, height 5\' 7"  (1.702 m), weight 105.7 kg, SpO2 94 %. PHYSICAL EXAMINATION:   GENERAL:  58 y.o.-year-old patient lying in the bed with no acute distress.  EYES: Pupils equal, round, reactive to light and accommodation. No scleral icterus. Extraocular muscles intact.  HEENT: Head atraumatic, normocephalic. Oropharynx and nasopharynx clear.  NECK:  Supple, no jugular venous distention. No thyroid enlargement, no tenderness.  LUNGS: Crackles at the bases . no crepitation. No use of accessory muscles of respiration.  CARDIOVASCULAR: S1, S2 normal. No murmurs, rubs, or gallops.   ABDOMEN: Soft, nontender, nondistended. Bowel sounds present. No organomegaly or mass.  EXTREMITIES: Positive left lower extremity swelling  Right lower extremity s/p BKA NEUROLOGIC: Cranial nerves II through XII are intact. Muscle strength 5/5 in all extremities. Sensation intact. Gait not checked.  PSYCHIATRIC: The patient is alert and oriented x 3.  SKIN: No obvious rash, lesion, or ulcer.   DATA REVIEWED:  LABORATORY PANEL:  Male CBC Recent Labs  Lab 11/06/18 0455  WBC 6.7  HGB 10.9*  HCT 37.5*  PLT 275   ------------------------------------------------------------------------------------------------------------------ Chemistries  Recent Labs  Lab 11/08/18 0511  NA 132*  K 4.2  CL 94*  CO2 30  GLUCOSE 97  BUN 23*  CREATININE 1.09  CALCIUM 8.5*   RADIOLOGY:  No results found. ASSESSMENT AND PLAN:   58 y.o. male PMH of COPD, CHF, ASCVD, PAD s/p right BKA who is in today for follow up of recent admission for LLE cellulitis presenting with leg swelling and worsening shortness of breath  1. Acute on chronic Congestive Heart Failure: Acute presentation likely due to volume overload with associated symptoms of SOB, BLE edema and abdominal   Ischemic Cardiomyopathy.  Last Echo 12/2017 with  EF 30-35% Continue metoprolol Continue Spironolactone (EF<35% and GFR>30) and Imdur Continue diuretics: Furosemide 40mg  IV BID.   Appreciate cardiology input 1 more day of IV diuresis  2. Coronary artery disease s/p DES 2013 and 2014 Most recent [place] 04/2017 at OSH w/ Prox RCA 70%, Mid RCA 80%, ostial D1 80%, Mid LAD, 60%, Prox LCx diffuse 90%, M1 85%. LVEDP 34 mmHg - ASA 81mg  PO daily - Clopidogrel 75mg  PO daily  - HTN, HLD, DM control as below -Patient  will need follow-[facility] with [facility]ology at [facility]  3. HLD = + Goal LDL<70 - Atorvastatin 80mg  PO qhs  4. HTN- stable + Goal BP <130/80 - Beta-blocker: Metoprolol - ACE-Inhibitor: Lisinopril on hold  5.  DM - Insulin dependent  at home,  on Lantus and Metformin at home. - Last Hemoglobin A1c 05/2018 6.7 -Continue Lantus - Titrate up based on ongoing Sliding Scale needs to maintain - Diabetes educator to follow  6. COPD-  No signs of exacerbation - patient with hx of COPD uses home oxygen at 2L intermittently - Supplemental O2, goal sat 88-92% - Bronchodilators (albuterol/ipratropium) standing and PRN   7. DVT prophylaxis - Lovenox   All the records are reviewed and case discussed with Care Management/Social Worker. Management plans discussed with the patient, family and they are in agreement.  CODE STATUS: Full Code  TOTAL TIME TAKING CARE OF THIS PATIENT: 26minutes.   More than 50% of the time was spent in counseling/coordination of care: YES  POSSIBLE D/C IN 1-2 DAYS, DEPENDING ON CLINICAL CONDITION.   on 11/08/2018 at 1:54 PM    Between 7am to 6pm - Pager - [phone]  After 6pm go to [email or web address] - [employer] [facility]  Office  [phone]  CC: Primary care physician; Clarisse Gouge, MD  Note: This dictation was prepared with Dragon dictation along with smaller phrase technology. Any transcriptional errors that result from this process are unintentional.

## 2018-11-08 NOTE — Progress Notes (Signed)
I assumed care of this patient at 27.

## 2018-11-08 NOTE — TOC Initial Note (Signed)
Transition of Care [facility]) - Initial/Assessment Note    Patient Details  Name: [name] [name] MRN: [id number] Date of Birth: 02/16/1961  Transition of Care [facility]) CM/SW Contact:    Latanya Maudlin, RN Phone Number: 11/08/2018, 1:43 PM  Clinical Narrative: [facility] team met with patient to complete high risk re assessment. Patient lives at home with his girlfriend. He currently has rolling walker, wheelchair, cane and prosthetic following an amputation in 2019. Patient has completed physical therapy in the past and reports good results. He also reports that his stregth and management of activities of daily living have decreased. [facility] in the past. Notified Corene Cornea of likely referral at discharge. PCP is Audiological scientist. Patient uses [facility].                 Expected Discharge Plan: [place] Barriers to Discharge: Continued Medical Work up   Patient Goals and CMS Choice Patient states their goals for this hospitalization and ongoing recovery are:: to get some therapy, this leg isnt doing good CMS Medicare.gov Compare Post Acute Care list provided to:: Patient Choice offered to / list presented to : Patient  Expected Discharge Plan and Services Expected Discharge Plan: [place]   Discharge Planning Services: CM Consult Post Acute Care Choice: [place] arrangements for the past 2 months: [place]: PT [place]: [place] ([place]) Date [place]: 11/08/18 Time [place]: 1343 Representative spoke with at [place]: Corene Cornea  Prior Living Arrangements/Services Living arrangements for the past 2 months: [place] with:: Significant Other Patient language and need for interpreter reviewed:: Yes Do you feel safe going back to the place where you live?: Yes          Current home services: DME Criminal Activity/Legal  Involvement Pertinent to Current Situation/Hospitalization: No - Comment as needed  Activities of Daily Living Home Assistive Devices/Equipment: Prosthesis, Walker (specify type) ADL Screening (condition at time of admission) Patient's cognitive ability adequate to safely complete daily activities?: Yes Is the patient deaf or have difficulty hearing?: No Does the patient have difficulty seeing, even when wearing glasses/contacts?: No Does the patient have difficulty concentrating, remembering, or making decisions?: No Patient able to express need for assistance with ADLs?: No Does the patient have difficulty dressing or bathing?: No Independently performs ADLs?: Yes (appropriate for developmental age) Does the patient have difficulty walking or climbing stairs?: Yes Weakness of Legs: None Weakness of Arms/Hands: None  Permission Sought/Granted                  Emotional Assessment Appearance:: Appears stated age Attitude/Demeanor/Rapport: Engaged Affect (typically observed): Accepting Orientation: : Oriented to Self, Oriented to Place, Oriented to  Time, Oriented to Situation      Admission diagnosis:  Shortness of breath [R06.02] Acute pulmonary edema (HCC) [J81.0] Acute on chronic congestive heart failure, unspecified heart failure type [facility]) [I50.9] Patient Active Problem List   Diagnosis Date Noted  . Acute on chronic systolic CHF (congestive heart failure) ([place]) 11/05/2018  . Open toe wound 10/01/2018  . Venous ulcer of left leg ([place]) 08/19/2018  . 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[place]) 04/28/2018  . Sepsis ([place]) 03/30/2018  . Abnormal MRI, shoulder (Right) 02/23/2018  .  Abnormal MRI, cervical spine (2016) 02/23/2018  . DDD (degenerative disc disease), cervical 02/23/2018  . Cervical foraminal stenosis (Bilateral) 02/23/2018  . Cervical central spinal stenosis 02/23/2018  . Cervical facet hypertrophy 02/23/2018  . Arthralgia of acromioclavicular  joint (Right) 02/23/2018  . Osteoarthritis of  AC (acromioclavicular) joint (Right) 02/23/2018  . Biceps tendinosis of shoulder (Right) 02/23/2018  . Tendinopathy of rotator cuff (Right) 02/23/2018  . Vitamin D deficiency 02/23/2018  . Atherosclerosis of native arteries of the extremities with ulceration ([place]) 02/15/2018  . Peripheral edema 02/05/2018  . Status post peripherally inserted central catheter (PICC) central line placement 02/04/2018  . Chronic lower extremity pain (Primary Area of Pain) (Right) 01/29/2018  . Chronic shoulder pain [facility] Area of Pain) (Right) 01/29/2018  . Chronic foot pain (Secondary Area of Pain) (Right) 01/29/2018  . Chronic pain syndrome 01/29/2018  . Long term current use of opiate analgesic 01/29/2018  . Medication monitoring encounter 01/29/2018  . Disorder of skeletal system 01/29/2018  . Problems influencing health status 01/29/2018  . AKI (acute kidney injury) ([place]) 01/03/2018  . NSTEMI (non-ST elevated myocardial infarction) ([place]) 11/21/2017  . Coronary artery disease of native artery of native heart with stable angina pectoris ([place]) 09/10/2017  . HLD (hyperlipidemia) 09/10/2017  . Angina pectoris ([place]) 09/08/2017  . Angina at rest [facility]) 08/13/2017  . Lymphedema 05/02/2017  . Chronic systolic HF (heart failure) ([place]) 04/23/2017  . Chest pain 07/11/2016  . Unstable angina ([place]) 07/11/2016  . Foot ulcer (Right)   . Stable angina (HCC)   . Pressure injury of skin 05/06/2016  . Diabetic foot infection ([place]) 05/05/2016  . Type 2 diabetes mellitus with complication, with long-term current use of insulin ([place]) 01/26/2016  . Cellulitis 11/21/2015  . Morbid obesity ([place]) 11/17/2014  . PAD (peripheral artery disease) ([place])   . Hypertension   . Tobacco use   . COPD (chronic obstructive pulmonary disease) ([place]) 05/19/2012  . Diabetic neuropathy ([place]) 05/19/2012   PCP:  Clarisse Gouge, MD Pharmacy:   [place], [place] [address] Phone: [phone] Fax: [place] [facility] [address] [place] [address] Phone: [phone] Fax: [phone]     Social Determinants of Health (SDOH) Interventions    Readmission Risk Interventions Readmission Risk Prevention Plan 11/08/2018  Transportation Screening Complete  HRI or Home Care Consult Complete  Medication Review (RN Care Manager) Complete  Some recent data might be hidden

## 2018-11-09 LAB — BASIC METABOLIC PANEL
Anion gap: 8 (ref 5–15)
BUN: 27 mg/dL — ABNORMAL HIGH (ref 6–20)
CO2: 30 mmol/L (ref 22–32)
Calcium: 8.3 mg/dL — ABNORMAL LOW (ref 8.9–10.3)
Chloride: 91 mmol/L — ABNORMAL LOW (ref 98–111)
Creatinine, Ser: 1.22 mg/dL (ref 0.61–1.24)
GFR calc Af Amer: >60 mL/min (ref >60)
GFR calc non Af Amer: >60 mL/min (ref >60)
Glucose, Bld: 95 mg/dL (ref 70–99)
Potassium: 4.4 mmol/L (ref 3.5–5.1)
Sodium: 129 mmol/L — ABNORMAL LOW (ref 135–145)

## 2018-11-09 LAB — GLUCOSE, CAPILLARY
Glucose-Capillary: 117 mg/dL — ABNORMAL HIGH (ref 70–99)
Glucose-Capillary: 91 mg/dL (ref 70–99)

## 2018-11-09 LAB — LIPID PANEL
Cholesterol: 80 mg/dL (ref 0–200)
HDL: 26 mg/dL — ABNORMAL LOW (ref >40)
LDL Cholesterol: 46 mg/dL (ref 0–99)
Total CHOL/HDL Ratio: 3.1 RATIO
Triglycerides: 42 mg/dL (ref <150)
VLDL: 8 mg/dL (ref 0–40)

## 2018-11-09 MED ORDER — FUROSEMIDE 40 MG PO TABS: 40.0000 mg | ORAL_TABLET | Freq: Two times a day (BID) | ORAL | 0 refills | Status: DC

## 2018-11-09 MED ORDER — IPRATROPIUM-ALBUTEROL 0.5-2.5 (3) MG/3ML IN SOLN: 3.0000 mL | RESPIRATORY_TRACT | Status: DC | PRN

## 2018-11-09 MED ORDER — ISOSORBIDE MONONITRATE ER 30 MG PO TB24: 30.0000 mg | ORAL_TABLET | Freq: Every day | ORAL | 0 refills | Status: DC

## 2018-11-09 NOTE — Care Management Important Message (Signed)
Important Message  Patient Details  Name: [name] [name] MRN: [id number] Date of Birth: 15-Jan-1961   Medicare Important Message Given:  Yes    [name] 11/09/2018, 11:28 AM

## 2018-11-09 NOTE — Progress Notes (Signed)
Progress Note  Patient Name: [name] [name] Date of Encounter: 11/09/2018  Primary Cardiologist: [facility]  Subjective   Documented UOP of 1.1 L for the past 24 hours with a net - 7.4 L for the admission with no documented input. Weight trending upwards from 105.7-->106.7 kg. BUN/SCr 16/0.92-->23/1.09-->27/1.22.   He wants to go home. SOB much improved. No chest pain.   Inpatient Medications    Scheduled Meds: . aspirin EC  81 mg Oral Daily  . atorvastatin  80 mg Oral Daily  . clopidogrel  75 mg Oral Daily  . enoxaparin (LOVENOX) injection  40 mg Subcutaneous Q24H  . fluticasone furoate-vilanterol  1 puff Inhalation Daily  . gabapentin  400 mg Oral TID  . insulin aspart  0-5 Units Subcutaneous QHS  . insulin aspart  0-9 Units Subcutaneous TID WC  . insulin glargine  20 Units Subcutaneous QHS  . ipratropium-albuterol  3 mL Nebulization Q6H  . ipratropium-albuterol  3 mL Nebulization Once  . isosorbide mononitrate  30 mg Oral Daily  . metoprolol succinate  25 mg Oral Daily  . spironolactone  25 mg Oral Daily   Continuous Infusions:  PRN Meds: acetaminophen **OR** acetaminophen, alum & mag hydroxide-simeth, ondansetron **OR** ondansetron (ZOFRAN) IV, oxyCODONE, traZODone   Vital Signs    Vitals:   11/08/18 2044 11/09/18 0425 11/09/18 0822 11/09/18 0858  BP:  125/81 115/87   Pulse:  93 92   Resp:  16 18   Temp:  97.9 F (36.6 C)    TempSrc:  Oral    SpO2: 98% (!) 86% 94% 94%  Weight:  106.7 kg    Height:        Intake/Output Summary (Last 24 hours) at 11/09/2018 1001 Last data filed at 11/09/2018 0829 Gross per 24 hour  Intake -  Output 1350 ml  Net -1350 ml   Filed Weights   11/07/18 0514 11/08/18 0518 11/09/18 0425  Weight: 103.7 kg 105.7 kg 106.7 kg    Telemetry    SR with 1st degree AV block - Personally Reviewed  ECG    n/a - Personally Reviewed  Physical Exam   GEN: No acute distress.   Neck: No JVD. Cardiac: RRR, no murmurs, rubs, or  gallops.  Respiratory: Faint expiratory wheezing bilaterally.  GI: Soft, nontender, non-distended.   MS: No edema of the left lower extremity; status post right BKA. Neuro:  Alert and oriented x 3; Nonfocal.  Psych: Normal affect.  Labs    Chemistry Recent Labs  Lab 11/06/18 0455 11/08/18 0511 11/09/18 0308  NA 135 132* 129*  K 4.3 4.2 4.4  CL 96* 94* 91*  CO2 28 30 30   GLUCOSE 114* 97 95  BUN 16 23* 27*  CREATININE 0.92 1.09 1.22  CALCIUM 8.7* 8.5* 8.3*  GFRNONAA >60 >60 >60  GFRAA >60 >60 >60  ANIONGAP 11 8 8      Hematology Recent Labs  Lab 11/05/18 1844 11/06/18 0455  WBC 7.0 6.7  RBC 5.66 5.72  HGB 10.9* 10.9*  HCT 36.9* 37.5*  MCV 65.2* 65.6*  MCH 19.3* 19.1*  MCHC 29.5* 29.1*  RDW 20.7* 20.7*  PLT 289 275    Cardiac Enzymes Recent Labs  Lab 11/05/18 1844  TROPONINI <0.03   No results for input(s): TROPIPOC in the last 168 hours.   BNP Recent Labs  Lab 11/05/18 1844  BNP 1,399.0*     DDimer No results for input(s): DDIMER in the last 168 hours.   Radiology  No results found.  Cardiac Studies   2D Echo 11/06/2018: 1. The left ventricle has severely reduced systolic function, with an ejection fraction of 20-25%. The cavity size was moderately dilated. Indeterminate diastolic filling due to E-A fusion. Left ventricular diffuse hypokinesis.  2. The right ventricle has severely reduced systolic function. The cavity was mildly enlarged. There is no increase in right ventricular wall thickness. Right ventricular systolic pressure is moderately elevated with an estimated pressure of 59.6 mmHg.  3. Left atrial size was mildly dilated.  4. Right atrial size was mildly dilated.  5. The mitral valve is degenerative. Mild thickening of the mitral valve leaflet. Mild calcification of the mitral valve leaflet. There is mild mitral annular calcification present.  6. The tricuspid valve is degenerative. Tricuspid valve regurgitation is moderate.  7. The  aortic valve was not well visualized. Moderate thickening of the aortic valve. Moderate calcification of the aortic valve.  8. The inferior vena cava was dilated in size with <50% respiratory variability.  Patient Profile     58 y.o. 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 Jannifer Franklin.  Assessment & Plan    1. Acute on chronic CHF: -Much improved -Renal function is trending upwards -Already received IV Lasix 40 mg this morning -Hold Lasix -Does not meet ordering criteria for tolvaptan  -Continue Toprol XL and spironolactone   -ACEi has previously been held secondary to relative hypotension -Pending trend of his renal function, consider rechallenging him with ARB on 6/2 with consideration of transition to [facility] as an outpatient  -Patient wants to go home -Could have our office order a BMET later this week if he goes home  2. CAD involving the native coronary arteries without angina: -No angina -Troponin negative x 1 this admission -Not followed closely at [facility] -He has indicated he would not want an open procedure  -ASA, Plavix, Imdur, metoprolol, Lipitor   3. Pulmonary hypertension: -Diuresis as above -Compliance with CPAP is advised -Outpatient follow up  4. HTN: -Well controlled -Continue current medications   5. HLD: -Continue Lipitor  -Check lipid panel  6. Hyponatremia: -Patient does not meet ordering criteria for tolvaptan -Lasix on hold as above   For questions or updates, please contact [place] Please  consult [email or web address] for contact info under Cardiology/STEMI.    Signed, Christell Faith, PA-C [place] Pager: [phone] 11/09/2018, 10:01 AM

## 2018-11-09 NOTE — Discharge Summary (Addendum)
[place] at [facility], [place] y.o., DOB 03-12-1961, MRN [id number]. Admission date: 11/05/2018 Discharge Date 11/09/2018 Primary MD Clarisse Gouge, MD Admitting Physician Lance Coon, MD  Admission Diagnosis  Shortness of breath [R06.02] Acute pulmonary edema ([place]) [J81.0] Acute on chronic congestive heart failure, unspecified heart failure type [facility]) [I50.9]  Discharge Diagnosis   Principal Problem: Acute on chronic systolic CHF Coronary artery disease Hyperlipidemia Essential hypertension Diabetes COPD without exacerbation    Hospital Course  [name] [name]  is a 58 y.o. male who presents with chief complaint as above.  Patient presents the ED with a complaint of 2 weeks of progressive lower extremity swelling and increasing shortness of breath.  He states that for the past 2-3 nights he is had significant orthopnea, and has been unable to lay flat.  Patient was aggressively diuresed.  And he did well with this.  Patient's breathing is much improved now.  He was seen by cardiology who will follow him up as outpatient.    Pt to have home health and pt         Consults  cardiology  Significant Tests:  See full reports for all details     Dg Chest Portable 1 View  Result Date: 11/05/2018 CLINICAL DATA:  Shortness of breath EXAM: PORTABLE CHEST 1 VIEW COMPARISON:  03/30/2018 FINDINGS: The heart size is enlarged. There are prominent interstitial lung markings bilaterally. There is no pneumothorax. No acute osseous abnormality. Chronic changes of the distal right clavicle are again noted. There may be trace bilateral pleural effusions. Aortic calcifications are noted. IMPRESSION: Cardiomegaly with findings suspicious for developing pulmonary edema. Electronically Signed   By: Constance Holster M.D.   On: 11/05/2018 20:07   Vas [place] Abi With/wo Tbi  Result Date: 10/19/2018 LOWER EXTREMITY DOPPLER STUDY Indications: Ulceration, and peripheral  artery disease.  Vascular Interventions: 08/24/2018 Pta of left tibioperoneal trunk & proximal                         PTA. PTA of entire SFA and above knee popliteal. SFA                         stent. Comparison Study: 09/10/2018 Performing Technologist: Almira Coaster RVS  Examination Guidelines: A complete evaluation includes at minimum, Doppler waveform signals and systolic blood pressure reading at the level of bilateral brachial, anterior tibial, and posterior tibial arteries, when vessel segments are accessible. Bilateral testing is considered an integral part of a complete examination. Photoelectric Plethysmograph (PPG) waveforms and toe systolic pressure readings are included as required and additional duplex testing as needed. Limited examinations for reoccurring indications may be performed as noted.  ABI Findings: +--------+------------------+-----+--------+---------+ Right   Rt Pressure (mmHg)IndexWaveformComment   +--------+------------------+-----+--------+---------+ Brachial140                                      +--------+------------------+-----+--------+---------+ ATA                                    Amputated +--------+------------------+-----+--------+---------+ PTA  Amputated +--------+------------------+-----+--------+---------+ +---------+------------------+-----+---------+-------+ Left     Lt Pressure (mmHg)IndexWaveform Comment +---------+------------------+-----+---------+-------+ Brachial 98                                      +---------+------------------+-----+---------+-------+ ATA      127               0.91 biphasic         +---------+------------------+-----+---------+-------+ PTA      130               0.93 triphasic        +---------+------------------+-----+---------+-------+ Great Toe144               1.03 Normal           +---------+------------------+-----+---------+-------+  +-------+-----------+-----------+------------+------------+ ABI/TBIToday's ABIToday's TBIPrevious ABIPrevious TBI +-------+-----------+-----------+------------+------------+ Left   .93        1.03       1.08        .99          +-------+-----------+-----------+------------+------------+ Left ABIs appear decreased compared to prior study on 09/10/2018. Left TBIs appear essentially unchanged compared to prior study on 09/10/2018.  Summary: Left: Resting left ankle-brachial index indicates mild left lower extremity arterial disease. The left toe-brachial index is normal.  *See table(s) above for measurements and observations.  Electronically signed by Hortencia Pilar MD on 10/19/2018 at 5:02:15 PM.   Final    Vas [place] Lower Extremity Arterial Duplex  Result Date: 10/19/2018 LOWER EXTREMITY ARTERIAL DUPLEX STUDY Indications: Ulceration, and esrd              Pre Access.  Vascular Interventions: 08/24/2018:Aortagram and Selective Left Lower Extremity                         Angiogram.                         PTA's of the Left Tib/Peroneal Trunk and Posterior                         tibial Artery.                         PTA of the Left SFAand Above knee Popliteal.                         Stent placement to the Left SFA. Current ABI:            Lt .93 Performing Technologist: Almira Coaster RVS  Examination Guidelines: A complete evaluation includes B-mode imaging, spectral Doppler, color Doppler, and power Doppler as needed of all accessible portions of each vessel. Bilateral testing is considered an integral part of a complete examination. Limited examinations for reoccurring indications may be performed as noted.  +-----------+--------+-----+--------+---------+--------+ LEFT       PSV cm/sRatioStenosisWaveform Comments +-----------+--------+-----+--------+---------+--------+ CFA Distal 123                  triphasic         +-----------+--------+-----+--------+---------+--------+ DFA        117                   triphasic         +-----------+--------+-----+--------+---------+--------+ SFA Prox   112  triphasic         +-----------+--------+-----+--------+---------+--------+ SFA Mid    38                   triphasic         +-----------+--------+-----+--------+---------+--------+ SFA Distal 47                   triphasic         +-----------+--------+-----+--------+---------+--------+ POP Distal 49                   triphasic         +-----------+--------+-----+--------+---------+--------+ ATA Distal 33                   biphasic          +-----------+--------+-----+--------+---------+--------+ PTA Distal 36                   triphasic         +-----------+--------+-----+--------+---------+--------+ PERO Distal42                   biphasic          +-----------+--------+-----+--------+---------+--------+  Left Stent(s): +--------------+--++--------++ Proximal Stent46biphasic +--------------+--++--------++    Summary: See table(s) above for measurements and observations. Electronically signed by Hortencia Pilar MD on 10/19/2018 at 5:02:10 PM.    Final        Today   Subjective:   [place] patient doing much better shortness of breath resolved  Objective:   Blood pressure 115/87, pulse 70, temperature 97.9 F (36.6 C), temperature source Oral, resp. rate 16, height 5\' 7"  (1.702 m), weight 106.7 kg, SpO2 94 %.  .  Intake/Output Summary (Last 24 hours) at 11/09/2018 1413 Last data filed at 11/09/2018 1344 Gross per 24 hour  Intake 240 ml  Output 1350 ml  Net -1110 ml    Exam VITAL SIGNS: Blood pressure 115/87, pulse 70, temperature 97.9 F (36.6 C), temperature source Oral, resp. rate 16, height 5\' 7"  (1.702 m), weight 106.7 kg, SpO2 94 %.  GENERAL:  58 y.o.-year-old patient lying in the bed with no acute distress.  EYES: Pupils equal, round, reactive to light and accommodation. No scleral icterus.  Extraocular muscles intact.  HEENT: Head atraumatic, normocephalic. Oropharynx and nasopharynx clear.  NECK:  Supple, no jugular venous distention. No thyroid enlargement, no tenderness.  LUNGS: Normal breath sounds bilaterally, no wheezing, rales,rhonchi or crepitation. No use of accessory muscles of respiration.  CARDIOVASCULAR: S1, S2 normal. No murmurs, rubs, or gallops.  ABDOMEN: Soft, nontender, nondistended. Bowel sounds present. No organomegaly or mass.  EXTREMITIES: Prosthesis in place NEUROLOGIC: Cranial nerves II through XII are intact. Muscle strength 5/5 in all extremities. Sensation intact. Gait not checked.  PSYCHIATRIC: The patient is alert and oriented x 3.  SKIN: No obvious rash, lesion, or ulcer.   Data Review     CBC w Diff:  Lab Results  Component Value Date   WBC 6.7 11/06/2018   HGB 10.9 (L) 11/06/2018   HGB 15.0 06/08/2014   HCT 37.5 (L) 11/06/2018   HCT 44.5 06/08/2014   PLT 275 11/06/2018   PLT 261 06/08/2014   LYMPHOPCT 20 07/27/2018   LYMPHOPCT 9.5 06/08/2014   MONOPCT 6 07/27/2018   MONOPCT 4.0 06/08/2014   EOSPCT 2 07/27/2018   EOSPCT 0.1 06/08/2014   BASOPCT 0 07/27/2018   BASOPCT 0.4 06/08/2014   CMP:  Lab Results  Component Value Date   NA 129 (L) 11/09/2018  NA 132 (L) 01/29/2018   NA 136 06/08/2014   K 4.4 11/09/2018   K 4.3 06/08/2014   CL 91 (L) 11/09/2018   CL 101 06/08/2014   CO2 30 11/09/2018   CO2 27 06/08/2014   BUN 27 (H) 11/09/2018   BUN 9 01/29/2018   BUN 9 06/08/2014   CREATININE 1.22 11/09/2018   CREATININE 0.87 02/05/2018   PROT 6.8 03/31/2018   PROT 6.1 01/29/2018   PROT 7.3 03/07/2014   ALBUMIN 2.3 (L) 03/31/2018   ALBUMIN 3.3 (L) 01/29/2018   ALBUMIN 3.9 03/07/2014   BILITOT 0.7 03/31/2018   BILITOT 0.6 01/29/2018   BILITOT 0.6 03/07/2014   ALKPHOS 97 03/31/2018   ALKPHOS 105 03/07/2014   AST 21 03/31/2018   AST 17 03/07/2014   ALT 18 03/31/2018   ALT 23 03/07/2014  .  Micro Results Recent Results  (from the past 240 hour(s))  SARS Coronavirus 2 (CEPHEID - Performed in [place] hospital lab), Hosp Order     Status: None   Collection Time: 11/05/18  9:19 PM  Result Value Ref Range Status   SARS Coronavirus 2 NEGATIVE NEGATIVE Final    Comment: (NOTE) If result is NEGATIVE SARS-CoV-2 target nucleic acids are NOT DETECTED. The SARS-CoV-2 RNA is generally detectable in upper and lower  respiratory specimens during the acute phase of infection. The lowest  concentration of SARS-CoV-2 viral copies this assay can detect is 250  copies / mL. A negative result does not preclude SARS-CoV-2 infection  and should not be used as the sole basis for treatment or other  patient management decisions.  A negative result may occur with  improper specimen collection / handling, submission of specimen other  than nasopharyngeal swab, presence of viral mutation(s) within the  areas targeted by this assay, and inadequate number of viral copies  (<250 copies / mL). A negative result must be combined with clinical  observations, patient history, and epidemiological information. If result is POSITIVE SARS-CoV-2 target nucleic acids are DETECTED. The SARS-CoV-2 RNA is generally detectable in upper and lower  respiratory specimens dur ing the acute phase of infection.  Positive  results are indicative of active infection with SARS-CoV-2.  Clinical  correlation with patient history and other diagnostic information is  necessary to determine patient infection status.  Positive results do  not rule out bacterial infection or co-infection with other viruses. If result is PRESUMPTIVE POSTIVE SARS-CoV-2 nucleic acids MAY BE PRESENT.   A presumptive positive result was obtained on the submitted specimen  and confirmed on repeat testing.  While 2019 novel coronavirus  (SARS-CoV-2) nucleic acids may be present in the submitted sample  additional confirmatory testing may be necessary for epidemiological  and /  or clinical management purposes  to differentiate between  SARS-CoV-2 and other Sarbecovirus currently known to infect humans.  If clinically indicated additional testing with an alternate test  methodology [phone]) is advised. The SARS-CoV-2 RNA is generally  detectable in upper and lower respiratory sp ecimens during the acute  phase of infection. The expected result is Negative. Fact Sheet for Patients:  StrictlyIdeas.no Fact Sheet for Healthcare Providers: BankingDealers.co.za This test is not yet approved or cleared by the [place] FDA and has been authorized for detection and/or diagnosis of SARS-CoV-2 by FDA under an Emergency Use Authorization (EUA).  This EUA will remain in effect (meaning this test can be used) for the duration of the COVID-19 declaration under Section 564(b)(1) of the Act, 21 U.S.C. section  360bbb-3(b)(1), unless the authorization is terminated or revoked sooner. Performed at [facility], [address], [place], [place] [address]         Code Status Orders  (From admission, onward)         Start     Ordered   11/06/18 0024  Full code  Continuous     11/06/18 0023        Code Status History    Date Active Date Inactive Code Status Order ID Comments User Context   08/24/2018 0939 08/24/2018 1444 Full Code [id number]  Algernon Huxley, MD Inpatient   07/27/2018 2248 07/28/2018 1836 Full Code [id number]  Vaughan Basta, MD Inpatient   03/30/2018 1839 04/07/2018 2116 Full Code [id number]  Nicholes Mango, MD Inpatient   03/03/2018 1044 03/03/2018 1545 Full Code [id number]  Katha Cabal, MD Inpatient   01/03/2018 0409 01/09/2018 1723 Full Code [id number]  Lance Coon, MD Inpatient   12/22/2017 0607 12/23/2017 1916 Full Code [id number]  Harrie Foreman, MD Inpatient   11/21/2017 0415 11/23/2017 1444 Full Code [id number]  Lance Coon, MD Inpatient   04/23/2017 0355 04/29/2017 1353 Full Code  [id number]  Saundra Shelling, MD Inpatient   07/11/2016 0324 07/11/2016 1851 Full Code [id number]  Saundra Shelling, MD Inpatient   05/06/2016 0005 05/07/2016 1417 Full Code [id number]  Harvie Bridge, DO Inpatient   11/21/2015 1716 11/22/2015 1950 Full Code [id number]  Gladstone Lighter, MD Inpatient   07/06/2015 0948 07/06/2015 1437 Full Code [id number]  Algernon Huxley, MD Inpatient          Follow-up Information    [facility] Follow up on 11/19/2018.   Specialty:  Cardiology Why:  at 10:00am Contact information: [place] [address] [place] [phone]       [facility] Cardiac and Pulmonary Rehab Follow up.   Specialty:  Cardiac Rehabilitation Why:  Your Cardiologist has referred you to Cardiac Rehab. The Dept. is currently closed due to COVID-19. If you receive Home Health or Physical Therapy, those sessions must be complete before Cardiac Rehab would begin. The Dept. will contact you soon.  Contact information: [place] [phone] [address] [place], PA-C Follow up in 3 day(s).   Specialties:  Physician Assistant, Cardiology, Radiology Why:  check bmp and f/u hosptial  Contact information: [place] [address] [phone]           Discharge Medications   Allergies as of 11/09/2018      Reactions   Dulaglutide Anaphylaxis, Diarrhea, Hives   Other Itching   Skin itching associated with nitro patch   Prednisone Rash      Medication List    TAKE these medications   aspirin EC 81 MG tablet Take 1 tablet (81 mg total) by mouth daily.   atorvastatin 80 MG tablet Commonly known as:  LIPITOR Take 80 mg by mouth daily.   calcium carbonate 500 MG chewable tablet Commonly known as:  TUMS - dosed in mg elemental calcium Chew 4 tablets by mouth daily as needed for indigestion or heartburn.   clopidogrel 75  MG tablet Commonly known as:  PLAVIX Take 75 mg by mouth daily.   diphenhydrAMINE 25 MG tablet Commonly known as:  BENADRYL Take 25 mg by mouth daily as needed for allergies.   fluticasone furoate-vilanterol 100-25 MCG/INH Aepb Commonly known as:  BREO  ELLIPTA Inhale 1 puff into the lungs daily.   furosemide 40 MG tablet Commonly known as:  LASIX Take 1 tablet (40 mg total) by mouth 2 (two) times daily. May take twice daily only if needed What changed:  when to take this   gabapentin 800 MG tablet Commonly known as:  NEURONTIN Take 800 mg by mouth 4 (four) times daily.   Insulin Glargine 100 UNIT/ML Solostar Pen Commonly known as:  Lantus SoloStar Inject 30 Units into the skin at bedtime.   insulin lispro 100 UNIT/ML injection Commonly known as:  HUMALOG Sliding Scale Insulin before meals using Novolog If BS is 0 to 70 no insulin and eat/glucose tablets/glucose get If BS 71 to 150 great! If BS 151 to 200, 2 Units Novolog If BS 201 to 250, 4 Units Novolog If BS 251 to 300, 6 Units Novolog If BS 301 to 350, 8 Units Novolog If BS 351 to 400, 10 Units Novolog If BS greater than 400, take 10 Units and Contact our office during office hour   ipratropium-albuterol 0.5-2.5 (3) MG/3ML Soln Commonly known as:  DUONEB Inhale 3 mLs into the lungs every 6 (six) hours as needed (wheezing/sob).   isosorbide mononitrate 30 MG 24 hr tablet Commonly known as:  IMDUR Take 1 tablet (30 mg total) by mouth daily. Start taking on:  November 10, 2018   metFORMIN 500 MG 24 hr tablet Commonly known as:  GLUCOPHAGE-XR Take 500 mg by mouth 2 (two) times daily.   metoprolol succinate 25 MG 24 hr tablet Commonly known as:  TOPROL-XL Take 25 mg by mouth daily.   Multi-Vitamins Tabs Take 1 tablet by mouth daily.   multivitamin-lutein Caps capsule Take 1 capsule by mouth daily.   nitroGLYCERIN 0.4 MG SL tablet Commonly known as:  NITROSTAT Place 0.4 mg under the tongue every 5 (five) minutes as  needed for chest pain.   Oxycodone HCl 10 MG Tabs Take 10 mg by mouth every 6 (six) hours as needed (pain).   potassium chloride 10 MEQ tablet Commonly known as:  K-DUR Take 10 mEq by mouth daily.   promethazine 25 MG tablet Commonly known as:  PHENERGAN TAKE 1 TABLET BY MOUTH EVERY 6 HOURS AS NEEDED NAUSEA   spironolactone 25 MG tablet Commonly known as:  ALDACTONE Take 25 mg by mouth daily.   traZODone 50 MG tablet Commonly known as:  DESYREL Take 50 mg by mouth at bedtime as needed for sleep.          Total Time in preparing paper work, data evaluation and todays exam - 64 minutes  Dustin Flock M.D on 11/09/2018 at 2:13 [place]  [phone]

## 2018-11-09 NOTE — TOC Transition Note (Signed)
Transition of Care [facility]) - CM/SW Discharge Note   Patient Details  Name: [name] MRN: [id number] Date of Birth: Jan 25, 1961  Transition of Care [facility]) CM/SW Contact:  Elza Rafter, RN Phone Number: 11/09/2018, 2:43 PM   Clinical Narrative:   Patient discharging today with girlfriend.  Patient will receive home health PT through [place].  Notified Corene Cornea with [facility] that he is discharging. No further needs identified.       Final next level of care: [place] Barriers to Discharge: No Barriers Identified   Patient Goals and CMS Choice Patient states their goals for this hospitalization and ongoing recovery are:: to get some therapy, this leg isnt doing good CMS Medicare.gov Compare Post Acute Care list provided to:: Patient Choice offered to / list presented to : Patient  Discharge Placement                       Discharge Plan and Services   Discharge Planning Services: CM Consult Post Acute Care Choice: [place]: PT [facility] Agency: [place] ([facility]) Date [facility] Agency Contacted: 11/09/18 Time [place]: [place] Representative spoke with at [place]: [place] ([place]) Interventions     Readmission Risk Interventions Readmission Risk Prevention Plan 11/09/2018 11/08/2018  Transportation Screening - Complete  HRI or [place] - Complete  Social Work Consult for [place] Planning/Counseling Not Complete -  SW consult not completed comments na -  Palliative Care Screening Not Applicable -  Medication Review Press photographer) - Complete  Some recent data might be hidden

## 2018-11-09 NOTE — Progress Notes (Signed)
IV and tele removed from patient. Discharge instructions given to patient. Verbalized understanding. Stressed importance of daily weights, fluid restriction and medications. Wife to transport patient home.

## 2018-11-09 NOTE — Progress Notes (Signed)
Cardiac Rehab Navigator/ Exercise Physiologist Note  CHF Education:??  Educational session with patient completed.   This EP provided patient with "Living Better with Heart Failure" packet. This EP briefly reviewed definition of heart failure and signs and symptoms of an exacerbation. This EP discussed potential causes of CHF.  This EP explained to patient that HF is a chronic illness which requires self-assessment / self-management along with help from the cardiologist/PCP. This EP discussed definition of EF measurement along with normal value compared to patient's EF 20-25%. ? *This EP reviewed importance of and reason behind checking weight daily in the AM, after using the bathroom, but before getting dressed. Patient does not have a functioning scale at home but does have the resources to get scales and reports that he will buy one after discharge. Patient reports that he will record weight, Zone, and body scan results daily on calendar.  *Reviewed with patient the following information: *Discussed when to call the Dr= weight gain of >2-3lb overnight of 5lb in a week,  *Discussed yellow zone= call MD: weight gain of >2-3lb overnight of 5lb in a week, increased swelling, increased SOB when lying down, chest discomfort, dizziness, increased fatigue *Red Zone= call 911: struggle to breath, fainting or near fainting, significant chest pain ?  *Heart Failure Zone Magnet given and reviewed with patient.   ? *Diet - Patient currently ordered carb modified diet. Referral for Dietitian Consultation for diet education has been ordered. Instructed patient to follow a low sodium diet of 2000 mg or less.  Recommended foods for low sodium heart heart failure carb modified nutrition therapy discussed. This EP reviewed with patient steps to reading a food label with close attention to serving size and mg of sodium.  Patient identified some foods from the suggestion list that he would enjoy eating and some from  the avoid list that he thinks he'd have trouble going without. This EP recommended that if he had to have one of those foods that his body and health would pay the price for that and that he should it only healthy foods surrounding the time he chose to eat the bad food to avoid another exacerbation. Patient voiced understanding.  ? *Discussed fluid intake with patient as well. Patient not currently on a fluid restriction, but advised no more than 64 ounces of fluid per day. This EP demonstrated this volume to patient using the bedside water pitcher.   ? *Instructed patient to take medications as prescribed for heart failure. Patient reports having a great relationship with his pharmacist and no trouble receiving his medications. ? *This EP discussed exercise / activity. Patient is sedentary at this time. Patient reports enjoying walking but currently is using a wheelchair at home. 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 Patient has a prosthesis from just above the knee down. Patient can ambulate with the walker but would benefit from [facility] and PT. This EP encouraged patient to be as active as possible. Case Manager is following up with Patient to see if [facility] and PT are appropriate. If so, then Cardiac Rehab would be an appropriate fit for Patient after [facility] and PT are complete. Patient was given patient program brochure and informational sheet.  ? *Smoking Cessation- Patient is a CURRENT smoker. "Thinking about Quitting - Yes, You Can" handout given and reviewed with patient.  Discussed free Telephonic Quit Smart program here at [facility]   ? *  [place] [facility] - Explained the purpose of the HF Clinic. This EP explained to patient the HF Clinic does not replace PCP nor Cardiologist, but is an additional resource to helping patient manage heart failure at home.Patient's appointment in the [facility] HF Clinic is scheduled for  11/19/18 at 10:00 am. ? Again, the 5 Steps to Living Better with Heart Failure were reviewed with patient.   Patient thanked me for providing the above information. ?  Jasper Loser, [place] [facility] Re[facility]  Exercise Physiologist Department Phone #: [phone] Fax: [phone]  Direct Line [phone] Email Address: [email or web address]

## 2018-11-10 NOTE — Progress Notes (Signed)
Date:  11/10/2018   ID:  [name], DOB 01-01-1961, MRN [id number]  Patient Location: Home Provider Location: Home  PCP:  Clarisse Gouge, MD  Cardiologist:  No primary care provider on file. hysiologist:  None   Evaluation Performed:  Follow-Up Visit  Chief Complaint:  Hospital follow up  History of Present Illness:    BLADYN TIPPS is a 58 y.o. male with history of   The patient  have symptoms concerning for COVID-19 infection (fever, chills, cough, or new shortness of breath).    Past Medical History:  Diagnosis Date  . Arthritis   . Asthma   . Atherosclerosis   . BPH (benign prostatic hyperplasia)   . Carotid arterial disease ([place])   . Charcot's joint of foot, right   . Chronic combined systolic (congestive) and diastolic (congestive) heart failure ([place])    a. 02/2013 EF 50% by LV gram; b. 04/2017 Echo: EF 25-30%. diff HK. Gr2 DD. Mod dil LA/RV. PASP 83mmHg.  [name] COPD (chronic obstructive pulmonary disease) ([place])   . Coronary artery disease    a. 2013 S/P PCI of LAD [facility]);  b. 03/2013 PCI: RCA 90p ( 2.5 x 23 mm DES); c. 02/2014 Cath: patent RCA stent->Med Rx; d. 04/2017 Cath: LM nl, LAd 30ost/p, 68m/d, D1 80ost, LCX 90p/m, OM1 85, RCA 70/20p, 76m, 70d->Referred for CT Surg-felt to be poor candidate.  . Diabetic neuropathy ([place])   . Diabetic ulcer of right foot ([place])   . Hernia   . Hyperlipidemia   . Hypertension   . Ischemic cardiomyopathy    a. 04/2017 Echo: EF 25-30%; b. 08/2017 Cardiac MRI ([facility]): EF 19%, sev glob HK. RVEF 20%, mod BAE, triv MR, mild-mod TR. Basal lateral subendocardial infarct (viable), inf/infsept, dist septal ischemia, basal to mid lat peri-infarct ischemia.  . Morbid obesity ([place])   . Neuropathy   . PAD (peripheral artery disease) ([place])    a. Followed by Dr. Lucky Cowboy; b. 08/2010 Periph Angio: RSFA 70-80p (6X60 self-expanding stent); c. 09/2010 Periph Angio: L SFA 100p (7x unknown length self-expanding stent); d. 06/2015 Periph Angio: R SFA short  segment occlusion (6x12 self-expanding stent).  . Restless leg syndrome   . Sleep apnea   . Subclavian artery stenosis, right ([place])   . Syncope and collapse   . Tobacco use    a. 75+ yr hx - still smoking 1ppd, down from 2 ppd.  . Type II diabetes mellitus ([place])   . Varicose vein    Past Surgical History:  Procedure Laterality Date  . ABDOMINAL AORTAGRAM N/A 06/23/2013   Procedure: ABDOMINAL Maxcine Ham;  Surgeon: Wellington Hampshire, MD;  Location: [facility] CATH LAB;  Service: Cardiovascular;  Laterality: N/A;  . AMPUTATION Right 04/01/2018   Procedure: AMPUTATION BELOW KNEE;  Surgeon: Algernon Huxley, MD;  Location: [facility] ORS;  Service: Vascular;  Laterality: Right;  . APPENDECTOMY    . CARDIAC CATHETERIZATION  10/14   [facility]; X1 STENT PROXIMAL RCA  . CARDIAC CATHETERIZATION  02/2010   [facility]  . CARDIAC CATHETERIZATION  02/21/2014   [facility]  . CLAVICLE SURGERY    . HERNIA REPAIR    . IRRIGATION AND DEBRIDEMENT FOOT Right 01/04/2018   Procedure: IRRIGATION AND DEBRIDEMENT FOOT;  Surgeon: Samara Deist, DPM;  Location: [facility] ORS;  Service: Podiatry;  Laterality: Right;  . LEFT HEART CATH AND CORS/GRAFTS ANGIOGRAPHY N/A 04/28/2017   Procedure: LEFT HEART CATH AND CORONARY ANGIOGRAPHY;  Surgeon: Wellington Hampshire, MD;  Location: [facility] [facility] CV  LAB;  Service: Cardiovascular;  Laterality: N/A;  . LOWER EXTREMITY ANGIOGRAPHY Right 03/03/2018   Procedure: LOWER EXTREMITY ANGIOGRAPHY;  Surgeon: Katha Cabal, MD;  Location: [place] [facility] CV LAB;  Service: Cardiovascular;  Laterality: Right;  . LOWER EXTREMITY ANGIOGRAPHY Left 08/24/2018   Procedure: LOWER EXTREMITY ANGIOGRAPHY;  Surgeon: Algernon Huxley, MD;  Location: [facility] CV LAB;  Service: Cardiovascular;  Laterality: Left;  . PERIPHERAL ARTERIAL STENT GRAFT     x2 left/right  . PERIPHERAL VASCULAR BALLOON ANGIOPLASTY Right 01/06/2018   Procedure: PERIPHERAL VASCULAR BALLOON ANGIOPLASTY;  Surgeon: Katha Cabal, MD;  Location: [facility] CV  LAB;  Service: Cardiovascular;  Laterality: Right;  . PERIPHERAL VASCULAR CATHETERIZATION N/A 07/06/2015   Procedure: Abdominal Aortogram w/Lower Extremity;  Surgeon: Algernon Huxley, MD;  Location: [facility] CV LAB;  Service: Cardiovascular;  Laterality: N/A;  . PERIPHERAL VASCULAR CATHETERIZATION  07/06/2015   Procedure: Lower Extremity Intervention;  Surgeon: Algernon Huxley, MD;  Location: [facility] CV LAB;  Service: Cardiovascular;;     No outpatient medications have been marked as taking for the 11/13/18 encounter (Appointment) with Rise Mu, PA-C.     Allergies:   Dulaglutide; Other; and Prednisone   Social History   Tobacco Use  . Smoking status: Current Every Day Smoker    Packs/day: 1.00    Years: 41.00    Pack years: 41.00    Types: Cigarettes  . Smokeless tobacco: Never Used  Substance Use Topics  . Alcohol use: No  . Drug use: No     Family Hx: The patient's family history includes Heart attack in his father; Heart disease in his father; Hyperlipidemia in his father and mother; Hypertension in his father and mother.  ROS:   Please see the history of present illness.     All other systems reviewed and are negative.   Prior CV studies:   The following studies were reviewed today:  2D Echo 10/2018: 1. The left ventricle has severely reduced systolic function, with an ejection fraction of 20-25%. The cavity size was moderately dilated. Indeterminate diastolic filling due to E-A fusion. Left ventricular diffuse hypokinesis.  2. The right ventricle has severely reduced systolic function. The cavity was mildly enlarged. There is no increase in right ventricular wall thickness. Right ventricular systolic pressure is moderately elevated with an estimated pressure of 59.6 mmHg.  3. Left atrial size was mildly dilated.  4. Right atrial size was mildly dilated.  5. The mitral valve is degenerative. Mild thickening of the mitral valve leaflet. Mild calcification of the  mitral valve leaflet. There is mild mitral annular calcification present.  6. The tricuspid valve is degenerative. Tricuspid valve regurgitation is moderate.  7. The aortic valve was not well visualized. Moderate thickening of the aortic valve. Moderate calcification of the aortic valve.  8. The inferior vena cava was dilated in size with <50% respiratory variability. __________  LHC 04/2017:  Prox RCA-1 lesion is 70% stenosed.  Prox RCA-2 lesion is 20% stenosed.  Mid RCA lesion is 80% stenosed.  Mid RCA to Dist RCA lesion is 70% stenosed.  Ost LAD to Prox LAD lesion is 30% stenosed.  Ost 1st Diag to 1st Diag lesion is 80% stenosed.  Prox LAD to Mid LAD lesion is 60% stenosed.  Dist LAD lesion is 60% stenosed.  Prox Cx to Mid Cx lesion is 90% stenosed.  Ost 1st Mrg to 1st Mrg lesion is 85% stenosed.   1.  Significant three-vessel coronary  artery disease with patent stent in the RCA and LAD.  There is significant proximal RCA disease before the stent as well as diffuse mid and distal disease in a vessel that appears to be about 2.5 mm in diameter.  There is significant bifurcation stenosis in the proximal left circumflex with a large OM1.  The LAD has moderate disease. The coronary arteries are moderately calcified and diffusely diseased throughout. 2.  Left ventricular angiography was not performed.  EF was moderately to severely reduced by echo. 3.  Severely elevated left ventricular end-diastolic pressure at 34 mmHg.  Recommendations: This is overall a difficult situation.  Revascularization options are somewhat limited due to diffuse disease overall.  Given that he is diabetic and has cardiomyopathy, best option might be CABG if he is found to be a candidate.  We need to optimize his heart failure first with outpatient referral to cardiothoracic surgery for evaluation.  I am going to increase intravenous diuresis today and the patient possibly can be discharged home tomorrow on  medical therapy.  Labs/Other Tests and Data Reviewed:    EKG:    Recent Labs: 12/21/2017: TSH 0.533 03/31/2018: ALT 18 04/06/2018: Magnesium 2.2 11/05/2018: B Natriuretic Peptide 1,399.0 11/06/2018: Hemoglobin 10.9; Platelets 275 11/09/2018: BUN 27; Creatinine, Ser 1.22; Potassium 4.4; Sodium 129   Recent Lipid Panel Lab Results  Component Value Date/Time   CHOL 80 11/09/2018 03:08 AM   CHOL 211 (H) 04/05/2013 05:20 AM   TRIG 42 11/09/2018 03:08 AM   TRIG 321 (H) 04/05/2013 05:20 AM   HDL 26 (L) 11/09/2018 03:08 AM   HDL 34 (L) 04/05/2013 05:20 AM   CHOLHDL 3.1 11/09/2018 03:08 AM   LDLCALC 46 11/09/2018 03:08 AM   LDLCALC 113 (H) 04/05/2013 05:20 AM    Wt Readings from Last 3 Encounters:  11/09/18 235 lb 4.8 oz (106.7 kg)  10/15/18 230 lb (104.3 kg)  09/10/18 222 lb (100.7 kg)     Objective:    Vital Signs:  There were no vitals taken for this visit.     ASSESSMENT & PLAN:    1.   COVID-19 Education: The signs and symptoms of COVID-19 were discussed with the patient and how to seek care for testing (follow up with PCP or arrange E-visit).  The importance of social distancing was discussed today.  Time:   Today, I have spent  minutes with the patient with telehealth technology discussing the above problems.     Medication Adjustments/Labs and Tests Ordered: Current medicines are reviewed at length with the patient today.  Concerns regarding medicines are outlined above.   Tests Ordered: No orders of the defined types were placed in this encounter.   Medication Changes: No orders of the defined types were placed in this encounter.   Disposition:  Follow up   Signed, Christell Faith, PA-C  11/10/2018 4:02 PM    [facility]

## 2018-11-12 ENCOUNTER — Telehealth: Payer: Self-pay

## 2018-11-12 NOTE — Telephone Encounter (Signed)
Virtual Visit Pre-Appointment Phone Call  "Denzell, I am calling you today to discuss your upcoming appointment. We are currently trying to limit exposure to the virus that causes COVID-19 by seeing patients at home rather than in the office."  1. "What is the BEST phone number to call the day of the visit?" - include this in appointment notes  2. "Do you have or have access to (through a family member/friend) a smartphone with video capability that we can use for your visit?" a. If yes - list this number in appt notes as "cell" (if different from BEST phone #) and list the appointment type as a VIDEO visit in appointment notes b. If no - list the appointment type as a PHONE visit in appointment notes  3. Confirm consent - "In the setting of the current Covid19 crisis, you are scheduled for a video visit with your provider on 11/13/2018 at 11:30AM.  Just as we do with many in-office visits, in order for you to participate in this visit, we must obtain consent.  If you'd like, I can send this to your mychart (if signed up) or email for you to review.  Otherwise, I can obtain your verbal consent now.  All virtual visits are billed to your insurance company just like a normal visit would be.  By agreeing to a virtual visit, we'd like you to understand that the technology does not allow for your provider to perform an examination, and thus may limit your provider's ability to fully assess your condition. If your provider identifies any concerns that need to be evaluated in person, we will make arrangements to do so.  Finally, though the technology is pretty good, we cannot assure that it will always work on either your or our end, and in the setting of a video visit, we may have to convert it to a phone-only visit.  In either situation, we cannot ensure that we have a secure connection.  Are you willing to proceed?" STAFF: Did the patient verbally acknowledge consent to telehealth visit? Document YES/NO  here: YES  4. Advise patient to be prepared - "Two hours prior to your appointment, go ahead and check your blood pressure, pulse, oxygen saturation, and your weight (if you have the equipment to check those) and write them all down. When your visit starts, your provider will ask you for this information. If you have an Apple Watch or Kardia device, please plan to have heart rate information ready on the day of your appointment. Please have a pen and paper handy nearby the day of the visit as well."  5. Give patient instructions for MyChart download to smartphone OR Doximity/Doxy.me as below if video visit (depending on what platform provider is using)  6. Inform patient they will receive a phone call 15 minutes prior to their appointment time (may be from unknown caller ID) so they should be prepared to answer    [facility] has been deemed a candidate for a follow-up tele-health visit to limit community exposure during the Covid-19 pandemic. I spoke with the patient via phone to ensure availability of phone/video source, confirm preferred email & phone number, and discuss instructions and expectations.  I reminded THADDUS MCDOWELL to be prepared with any vital sign and/or heart rhythm information that could potentially be obtained via home monitoring, at the time of his visit. I reminded EDGER HUSAIN to expect a phone call prior to his visit.  [name] [name] 11/12/2018 9:07 AM   INSTRUCTIONS FOR DOWNLOADING THE MYCHART APP TO SMARTPHONE  - The patient must first make sure to have activated MyChart and know their login information - If Apple, go to [employer] and type in MyChart in the search bar and download the app. If Android, ask patient to go to [employer] and type in [place] in the search bar and download the app. The app is free but as with any other app downloads, their phone may require them to verify saved payment information or Apple/Android  password.  - The patient will need to then log into the app with their MyChart username and password, and select [facility] as their healthcare provider to link the account. When it is time for your visit, go to the MyChart app, find appointments, and click Begin Video Visit. Be sure to Select Allow for your device to access the Microphone and Camera for your visit. You will then be connected, and your provider will be with you shortly.  **If they have any issues connecting, or need assistance please contact MyChart service desk [phone])**  **If using a computer, in order to ensure the best quality for their visit they will need to use either of the following Internet Browsers: [employer], or Google Chrome**  IF USING DOXIMITY or DOXY.ME - The patient will receive a link just prior to their visit by text.     FULL LENGTH CONSENT FOR TELE-HEALTH VISIT   I hereby voluntarily request, consent and authorize [facility] and its employed or contracted physicians, physician assistants, nurse practitioners or other licensed health care professionals (the Practitioner), to provide me with telemedicine health care services (the "Services") as deemed necessary by the treating Practitioner. I acknowledge and consent to receive the Services by the Practitioner via telemedicine. I understand that the telemedicine visit will involve communicating with the Practitioner through live audiovisual communication technology and the disclosure of certain medical information by electronic transmission. I acknowledge that I have been given the opportunity to request an in-person assessment or other available alternative prior to the telemedicine visit and am voluntarily participating in the telemedicine visit.  I understand that I have the right to withhold or withdraw my consent to the use of telemedicine in the course of my care at any time, without affecting my right to future care or treatment,  and that the Practitioner or I may terminate the telemedicine visit at any time. I understand that I have the right to inspect all information obtained and/or recorded in the course of the telemedicine visit and may receive copies of available information for a reasonable fee.  I understand that some of the potential risks of receiving the Services via telemedicine include:  [name] Delay or interruption in medical evaluation due to technological equipment failure or disruption; . Information transmitted may not be sufficient (e.g. poor resolution of images) to allow for appropriate medical decision making by the Practitioner; and/or  . In rare instances, security protocols could fail, causing a breach of personal health information.  Furthermore, I acknowledge that it is my responsibility to provide information about my medical history, conditions and care that is complete and accurate to the best of my ability. I acknowledge that Practitioner's advice, recommendations, and/or decision may be based on factors not within their control, such as incomplete or inaccurate data provided by me or distortions of diagnostic images or specimens that may result from electronic transmissions. I understand that  the practice of medicine is not an exact science and that Practitioner makes no warranties or guarantees regarding treatment outcomes. I acknowledge that I will receive a copy of this consent concurrently upon execution via email to the email address I last provided but may also request a printed copy by calling the office of [facility].    I understand that my insurance will be billed for this visit.   I have read or had this consent read to me. . I understand the contents of this consent, which adequately explains the benefits and risks of the Services being provided via telemedicine.  . I have been provided ample opportunity to ask questions regarding this consent and the Services and have had my questions  answered to my satisfaction. . I give my informed consent for the services to be provided through the use of telemedicine in my medical care  By participating in this telemedicine visit I agree to the above.

## 2018-11-13 ENCOUNTER — Other Ambulatory Visit: Payer: Self-pay

## 2018-11-13 ENCOUNTER — Telehealth (INDEPENDENT_AMBULATORY_CARE_PROVIDER_SITE_OTHER): Payer: Self-pay | Admitting: Physician Assistant

## 2018-11-16 ENCOUNTER — Telehealth: Payer: Medicare Other | Admitting: Physician Assistant

## 2018-11-18 ENCOUNTER — Telehealth: Payer: Self-pay | Admitting: Cardiovascular Disease

## 2018-11-18 MED ORDER — POTASSIUM CHLORIDE ER 10 MEQ PO TBCR: 20.0000 meq | EXTENDED_RELEASE_TABLET | Freq: Every day | ORAL | 3 refills | Status: DC

## 2018-11-18 MED ORDER — TORSEMIDE 20 MG PO TABS: 40.0000 mg | ORAL_TABLET | Freq: Two times a day (BID) | ORAL | 3 refills | Status: DC

## 2018-11-18 NOTE — Telephone Encounter (Signed)
Spoke with patient and he states that he was on his way to the hospital to have fluid drawn off of him. He states that his fluid medication is just not helping. Reviewed that he really needs to avoid any salt and fast foods because this can certainly make it worse for him. Also reviewed importance of reducing fluid intake and only drinking when thirsty and that the salt and fluid are something that he needs to avoid right now. Reviewed that discussed with Dr. Rockey Situ and verbal orders received to discontinue his furosemide and start torsemide 20 mg and take 2 tablets (40 mg) twice daily along with potassium 10 mEq and take 2 tablets (20 mEq) once daily. Provider also wants him to continue on his spironolactone for now. Confirmed his appointment tomorrow in the [facility] here in [facility] on floor 2. Instructed him to please arrive early because he will need to enter through the [facility] for screening and they will instruct him where to go from there. He verbalized understanding of instructions, agreement with plan, and had no further questions at this time.

## 2018-11-19 ENCOUNTER — Other Ambulatory Visit: Payer: Self-pay

## 2018-11-19 ENCOUNTER — Ambulatory Visit: Payer: Medicare Other | Admitting: Family

## 2018-11-19 ENCOUNTER — Telehealth: Payer: Self-pay

## 2018-11-19 ENCOUNTER — Telehealth: Payer: Self-pay | Admitting: Family

## 2018-11-19 ENCOUNTER — Ambulatory Visit: Payer: Medicare Other | Attending: Family | Admitting: Family

## 2018-11-19 VITALS — Wt 237.0 lb

## 2018-11-19 DIAGNOSIS — Z794 Long term (current) use of insulin: Secondary | ICD-10-CM

## 2018-11-19 DIAGNOSIS — Z720 Tobacco use: Secondary | ICD-10-CM

## 2018-11-19 DIAGNOSIS — E11621 Type 2 diabetes mellitus with foot ulcer: Secondary | ICD-10-CM

## 2018-11-19 DIAGNOSIS — I5022 Chronic systolic (congestive) heart failure: Secondary | ICD-10-CM

## 2018-11-19 DIAGNOSIS — L97509 Non-pressure chronic ulcer of other part of unspecified foot with unspecified severity: Secondary | ICD-10-CM

## 2018-11-19 DIAGNOSIS — I10 Essential (primary) hypertension: Secondary | ICD-10-CM

## 2018-11-19 NOTE — Progress Notes (Deleted)
Patient ID: [name] [name], male    DOB: Feb 09, 1961, 58 y.o.   MRN: [id number]  HPI  [name] [name] is a 58 y/o male with a history of obstructive sleep apnea, PVD, PAD, HTN, hyperlipidemia, DM, CAD, COPD, BPH, asthma, current tobacco use and chronic heart failure.   Echo report from 11/06/2018 reviewed and showed an EF of 20-25% along with moderate TR and in elevated PA pressure of 59.6 mmHg. Echo report from 04/25/17 reviewed and shows an EF of 25-30% along with a PA pressure of 34 mm Hg. EF has declined from 55-60% back in 2016.   Cardiac catheterization done 04/28/17 showed significant three-vessel disease with a patient stent in the RCA and LAD. Significant proximal RCS disease and the stent as well as diffuse mid and distal disease. LAD has moderate disease. Severely elevated left ventricular end-diastolic pressure at 34 mmHg. Possible CABG in the future with optimizing medical management. Stress done in 2015.  Admitted 11/05/2018 due to acute on chronic HF. Cardiology consult obtained. Aggressively diuresed and discharged after 4 days with home health PT.   He presents today for a follow-up visit, although hasn't been seen since Dec. 2018. He presents with a chief complaint of   Cardiology changed his diuretic to torsemide yesterday due to increased edema.   Past Medical History:  Diagnosis Date  . Arthritis   . Asthma   . Atherosclerosis   . BPH (benign prostatic hyperplasia)   . Carotid arterial disease ([place])   . Charcot's joint of foot, right   . Chronic combined systolic (congestive) and diastolic (congestive) heart failure ([place])    a. 02/2013 EF 50% by LV gram; b. 04/2017 Echo: EF 25-30%. diff HK. Gr2 DD. Mod dil LA/RV. PASP 63mmHg.  [name] COPD (chronic obstructive pulmonary disease) ([place])   . Coronary artery disease    a. 2013 S/P PCI of LAD [facility]);  b. 03/2013 PCI: RCA 90p ( 2.5 x 23 mm DES); c. 02/2014 Cath: patent RCA stent->Med Rx; d. 04/2017 Cath: LM nl, LAd 30ost/p, 45m/d, D1 80ost,  LCX 90p/m, OM1 85, RCA 70/20p, 66m, 70d->Referred for CT Surg-felt to be poor candidate.  . Diabetic neuropathy ([place])   . Diabetic ulcer of right foot ([place])   . Hernia   . Hyperlipidemia   . Hypertension   . Ischemic cardiomyopathy    a. 04/2017 Echo: EF 25-30%; b. 08/2017 Cardiac MRI ([facility]): EF 19%, sev glob HK. RVEF 20%, mod BAE, triv [name], mild-mod TR. Basal lateral subendocardial infarct (viable), inf/infsept, dist septal ischemia, basal to mid lat peri-infarct ischemia.  . Morbid obesity ([place])   . Neuropathy   . PAD (peripheral artery disease) ([place])    a. Followed by Dr. Lucky Cowboy; b. 08/2010 Periph Angio: RSFA 70-80p (6X60 self-expanding stent); c. 09/2010 Periph Angio: L SFA 100p (7x unknown length self-expanding stent); d. 06/2015 Periph Angio: R SFA short segment occlusion (6x12 self-expanding stent).  . Restless leg syndrome   . Sleep apnea   . Subclavian artery stenosis, right ([place])   . Syncope and collapse   . Tobacco use    a. 75+ yr hx - still smoking 1ppd, down from 2 ppd.  . Type II diabetes mellitus ([place])   . Varicose vein    Past Surgical History:  Procedure Laterality Date  . ABDOMINAL AORTAGRAM N/A 06/23/2013   Procedure: ABDOMINAL Maxcine Ham;  Surgeon: Wellington Hampshire, MD;  Location: [place] CATH LAB;  Service: Cardiovascular;  Laterality: N/A;  . AMPUTATION Right 04/01/2018  Procedure: AMPUTATION BELOW KNEE;  Surgeon: Algernon Huxley, MD;  Location: [facility] ORS;  Service: Vascular;  Laterality: Right;  . APPENDECTOMY    . CARDIAC CATHETERIZATION  10/14   [facility]; X1 STENT PROXIMAL RCA  . CARDIAC CATHETERIZATION  02/2010   [facility]  . CARDIAC CATHETERIZATION  02/21/2014   [facility]  . CLAVICLE SURGERY    . HERNIA REPAIR    . IRRIGATION AND DEBRIDEMENT FOOT Right 01/04/2018   Procedure: IRRIGATION AND DEBRIDEMENT FOOT;  Surgeon: Samara Deist, DPM;  Location: [facility] ORS;  Service: Podiatry;  Laterality: Right;  . LEFT HEART CATH AND CORS/GRAFTS ANGIOGRAPHY N/A 04/28/2017   Procedure: LEFT HEART  CATH AND CORONARY ANGIOGRAPHY;  Surgeon: Wellington Hampshire, MD;  Location: [place] CV LAB;  Service: Cardiovascular;  Laterality: N/A;  . LOWER EXTREMITY ANGIOGRAPHY Right 03/03/2018   Procedure: LOWER EXTREMITY ANGIOGRAPHY;  Surgeon: Katha Cabal, MD;  Location: [place] CV LAB;  Service: Cardiovascular;  Laterality: Right;  . LOWER EXTREMITY ANGIOGRAPHY Left 08/24/2018   Procedure: LOWER EXTREMITY ANGIOGRAPHY;  Surgeon: Algernon Huxley, MD;  Location: [place] CV LAB;  Service: Cardiovascular;  Laterality: Left;  . PERIPHERAL ARTERIAL STENT GRAFT     x2 left/right  . PERIPHERAL VASCULAR BALLOON ANGIOPLASTY Right 01/06/2018   Procedure: PERIPHERAL VASCULAR BALLOON ANGIOPLASTY;  Surgeon: Katha Cabal, MD;  Location: [place] CV LAB;  Service: Cardiovascular;  Laterality: Right;  . PERIPHERAL VASCULAR CATHETERIZATION N/A 07/06/2015   Procedure: Abdominal Aortogram w/Lower Extremity;  Surgeon: Algernon Huxley, MD;  Location: [place] CV LAB;  Service: Cardiovascular;  Laterality: N/A;  . PERIPHERAL VASCULAR CATHETERIZATION  07/06/2015   Procedure: Lower Extremity Intervention;  Surgeon: Algernon Huxley, MD;  Location: [place] CV [facility];  Service: Cardiovascular;;   Family History  Problem Relation Age of Onset  . Heart attack Father   . Heart disease Father   . Hypertension Father   . Hyperlipidemia Father   . Hypertension Mother   . Hyperlipidemia Mother    Social History   Tobacco Use  . Smoking status: Current Every Day Smoker    Packs/day: 1.00    Years: 41.00    Pack years: 41.00    Types: Cigarettes  . Smokeless tobacco: Never Used  Substance Use Topics  . Alcohol use: No   Allergies  Allergen Reactions  . Dulaglutide Anaphylaxis, Diarrhea and Hives  . Other Itching    Skin itching associated with nitro patch  . Prednisone Rash    Review of Systems  Constitutional: Positive for fatigue. Negative for appetite change.  HENT: Negative for  congestion, postnasal drip and sore throat.   Eyes: Positive for visual disturbance (blurry vision).  Respiratory: Positive for shortness of breath and wheezing. Negative for cough and chest tightness.   Cardiovascular: Positive for chest pain (this morning ). Negative for palpitations and leg swelling.  Gastrointestinal: Negative for abdominal distention and abdominal pain.  Endocrine: Negative.   Genitourinary: Negative.   Musculoskeletal: Positive for back pain. Negative for neck pain.  Skin: Positive for wound (nonhealing ulcer right foot).  Allergic/Immunologic: Negative.   Neurological: Positive for light-headedness. Negative for dizziness.  Hematological: Negative for adenopathy. Bruises/bleeds easily.  Psychiatric/Behavioral: Negative for dysphoric mood and sleep disturbance (sleeping on 2 pillows). The patient is not nervous/anxious.     Physical Exam  Constitutional: He is oriented to person, place, and time. He appears well-developed and well-nourished.  HENT:  Head: Normocephalic and atraumatic.  Neck: Normal range of motion.  Neck supple. No JVD present.  Cardiovascular: Normal rate. An irregular rhythm present.  Pulmonary/Chest: Effort normal. He has no wheezes. He has no rales.  Abdominal: Soft. He exhibits no distension. There is no abdominal tenderness.  Musculoskeletal:        General: No tenderness or edema.  Neurological: He is alert and oriented to person, place, and time.  Skin: Skin is warm and dry.  Psychiatric: He has a normal mood and affect. His behavior is normal. Thought content normal.  Nursing note and vitals reviewed.   Assessment & Plan:  1: Chronic heart failure with reduced ejection fraction- - NYHA class II - euvolemic today - weighing daily and he was reminded to call for an overnight weight gain of >2 pounds or a weekly weight gain of >5 pounds  - not adding salt and has been using Mrs. Dash & trying to read food labels.  - no showed for  visit with cardiology Idolina Primer) on 11/13/2018 - BNP 11/05/2018 was 1399.0  2: HTN- - BP  - had telemedicine visit with PCP Vickki Muff) 09/07/2018 - BMP done 11/09/2018 reviewed and shows sodium 129, potassium 4.4, creatinine 1.22 and GFR >60  3: Diabetes- - glucose at home has been running in the  - A1c on 11/21/17 was 8.5%  4: Tobacco use- - currently smoking <1/2 ppd of cigarettes - complete cessation discussed for 3 minutes with him  Patient did not bring his medications nor a list. Each medication was verbally reviewed with the patient and he was encouraged to bring the bottles to every visit to confirm accuracy of list.

## 2018-11-19 NOTE — Telephone Encounter (Signed)
TELEPHONE CALL NOTE  [name] [name] has been deemed a candidate for a follow-up tele-health visit to limit community exposure during the Covid-19 pandemic. I spoke with the patient via phone to ensure availability of phone/video source, confirm preferred email & phone number, discuss instructions and expectations, and review consent.   I reminded [name] [name] to be prepared with any vital sign and/or heart rhythm information that could potentially be obtained via home monitoring, at the time of his visit.  Finally, I reminded [name] [name] to expect an e-mail containing a link for their video-based visit approximately 15 minutes before his visit, or alternatively, a phone call at the time of his visit if his visit is planned to be a phone encounter.  Did the patient verbally consent to treatment as below? YES  [name] [name], CMA 11/19/2018 12:50 PM  CONSENT FOR TELE-HEALTH VISIT - PLEASE REVIEW  I hereby voluntarily request, consent and authorize [facility] and its employed or contracted physicians, physician assistants, nurse practitioners or other licensed health care professionals (the Practitioner), to provide me with telemedicine health care services (the "Services") as deemed necessary by the treating Practitioner. I acknowledge and consent to receive the Services by the Practitioner via telemedicine. I understand that the telemedicine visit will involve communicating with the Practitioner through telephonic communication technology and the disclosure of certain medical information by electronic transmission. I acknowledge that I have been given the opportunity to request an in-person assessment or other available alternative prior to the telemedicine visit and am voluntarily participating in the telemedicine visit.  I understand that I have the right to withhold or withdraw my consent to the use of telemedicine in the course of my care at any time, without affecting my  right to future care or treatment, and that the Practitioner or I may terminate the telemedicine visit at any time. I understand that I have the right to inspect all information obtained and/or recorded in the course of the telemedicine visit and may receive copies of available information for a reasonable fee.  I understand that some of the potential risks of receiving the Services via telemedicine include:  [name] Delay or interruption in medical evaluation due to technological equipment failure or disruption; . Information transmitted may not be sufficient (e.g. poor resolution of images) to allow for appropriate medical decision making by the Practitioner; and/or  . In rare instances, security protocols could fail, causing a breach of personal health information.  Furthermore, I acknowledge that it is my responsibility to provide information about my medical history, conditions and care that is complete and accurate to the best of my ability. I acknowledge that Practitioner's advice, recommendations, and/or decision may be based on factors not within their control, such as incomplete or inaccurate data provided by me or lack of visual representation. I understand that the practice of medicine is not an exact science and that Practitioner makes no warranties or guarantees regarding treatment outcomes. I acknowledge that I will receive a copy of this consent concurrently upon execution via email to the email address I last provided but may also request a printed copy by calling the office of [facility].    I understand that my insurance may be billed for this visit.   I have read or had this consent read to me. . I understand the contents of this consent, which adequately explains the benefits and risks of the Services being provided via telemedicine.  [name]  I have been provided ample opportunity to ask questions regarding this consent and the Services and have had my questions answered to my  satisfaction. . I give my informed consent for the services to be provided through the use of telemedicine in my medical care  By participating in this telemedicine visit I agree to the above.

## 2018-11-19 NOTE — Progress Notes (Signed)
Virtual Visit via Telephone Note   Evaluation Performed:  Follow-up visit although hasn't been seen since 2018 and didn't show earlier today for his in-person visit.   This visit type was conducted due to national recommendations for restrictions regarding the COVID-19 Pandemic (e.g. social distancing).  This format is felt to be most appropriate for this patient at this time.  All issues noted in this document were discussed and addressed.  No physical exam was performed (except for noted visual exam findings with Video Visits).  Please refer to the patient's chart (MyChart message for video visits and phone note for telephone visits) for the patient's consent to telehealth for [facility]  Date:  11/19/2018   ID:  [name] [name], DOB 1960/12/21, MRN [id number]  Patient Location:  [place] [address]   Provider location:   [facility] [place] [address], [place] [address]  PCP:  Clarisse Gouge, MD  Cardiologist:  Ida Rogue, MD Electrophysiologist:  None   Chief Complaint:  fatigue  History of Present Illness:    [name] [name] is a 58 y.o. male who presents via audio/video conferencing for a telehealth visit today.  Patient verified DOB and address.  The patient does not have symptoms concerning for COVID-19 infection (fever, chills, cough, or new SHORTNESS OF BREATH).   Patient reports moderate fatigue upon minimal exertion. He describes this as chronic in nature having been present for several years. He has associated swelling in his legs (although better), shortness of breath or difficulty sleeping. He denies dizziness, palpitations, chest pain or cough. Says that he hasn't weighed today and doesn't recall what he weighed yesterday. Diuretic had been changed by cardiology yesterday and he feels like his edema is better.   Prior CV studies:   The following studies were reviewed today:  Echo report from 11/06/2018 reviewed  and showed an EF of 20-25% along with moderate TR and an elevated PA pressure of 59.6 mmHg.   Cardiac catheterization done 04/28/17 and showed:  Prox RCA-1 lesion is 70% stenosed.  Prox RCA-2 lesion is 20% stenosed.  Mid RCA lesion is 80% stenosed.  Mid RCA to Dist RCA lesion is 70% stenosed.  Ost LAD to Prox LAD lesion is 30% stenosed.  Ost 1st Diag to 1st Diag lesion is 80% stenosed.  Prox LAD to Mid LAD lesion is 60% stenosed.  Dist LAD lesion is 60% stenosed.  Prox Cx to Mid Cx lesion is 90% stenosed.  Ost 1st Mrg to 1st Mrg lesion is 85% stenosed.   1.  Significant three-vessel coronary artery disease with patent stent in the RCA and LAD.  There is significant proximal RCA disease before the stent as well as diffuse mid and distal disease in a vessel that appears to be about 2.5 mm in diameter.  There is significant bifurcation stenosis in the proximal left circumflex with a large OM1.  The LAD has moderate disease. The coronary arteries are moderately calcified and diffusely diseased throughout. 2.  Left ventricular angiography was not performed.  EF was moderately to severely reduced by echo. 3.  Severely elevated left ventricular end-diastolic pressure at 34 mmHg.  Past Medical History:  Diagnosis Date  . Arthritis   . Asthma   . Atherosclerosis   . BPH (benign prostatic hyperplasia)   . Carotid arterial disease ([place])   . Charcot's joint of foot, right   . Chronic combined systolic (congestive) and diastolic (congestive)  heart failure ([place])    a. 02/2013 EF 50% by LV gram; b. 04/2017 Echo: EF 25-30%. diff HK. Gr2 DD. Mod dil LA/RV. PASP 44mmHg.  [name] COPD (chronic obstructive pulmonary disease) ([place])   . Coronary artery disease    a. 2013 S/P PCI of LAD [facility]);  b. 03/2013 PCI: RCA 90p ( 2.5 x 23 mm DES); c. 02/2014 Cath: patent RCA stent->Med Rx; d. 04/2017 Cath: LM nl, LAd 30ost/p, 42m/d, D1 80ost, LCX 90p/m, OM1 85, RCA 70/20p, 58m, 70d->Referred for CT Surg-felt to be  poor candidate.  . Diabetic neuropathy ([place])   . Diabetic ulcer of right foot ([place])   . Hernia   . Hyperlipidemia   . Hypertension   . Ischemic cardiomyopathy    a. 04/2017 Echo: EF 25-30%; b. 08/2017 Cardiac MRI ([facility]): EF 19%, sev glob HK. RVEF 20%, mod BAE, triv MR, mild-mod TR. Basal lateral subendocardial infarct (viable), inf/infsept, dist septal ischemia, basal to mid lat peri-infarct ischemia.  . Morbid obesity ([place])   . Neuropathy   . PAD (peripheral artery disease) ([place])    a. Followed by Dr. Lucky Cowboy; b. 08/2010 Periph Angio: RSFA 70-80p (6X60 self-expanding stent); c. 09/2010 Periph Angio: L SFA 100p (7x unknown length self-expanding stent); d. 06/2015 Periph Angio: R SFA short segment occlusion (6x12 self-expanding stent).  . Restless leg syndrome   . Sleep apnea   . Subclavian artery stenosis, right ([place])   . Syncope and collapse   . Tobacco use    a. 75+ yr hx - still smoking 1ppd, down from 2 ppd.  . Type II diabetes mellitus ([place])   . Varicose vein    Past Surgical History:  Procedure Laterality Date  . ABDOMINAL AORTAGRAM N/A 06/23/2013   Procedure: ABDOMINAL Maxcine Ham;  Surgeon: Wellington Hampshire, MD;  Location: [facility] CATH [facility];  Service: Cardiovascular;  Laterality: N/A;  . AMPUTATION Right 04/01/2018   Procedure: AMPUTATION BELOW KNEE;  Surgeon: Algernon Huxley, MD;  Location: [facility] ORS;  Service: Vascular;  Laterality: Right;  . APPENDECTOMY    . CARDIAC CATHETERIZATION  10/14   [facility]; X1 STENT PROXIMAL RCA  . CARDIAC CATHETERIZATION  02/2010   [facility]  . CARDIAC CATHETERIZATION  02/21/2014   [facility]  . CLAVICLE SURGERY    . HERNIA REPAIR    . IRRIGATION AND DEBRIDEMENT FOOT Right 01/04/2018   Procedure: IRRIGATION AND DEBRIDEMENT FOOT;  Surgeon: Samara Deist, DPM;  Location: [facility] ORS;  Service: Podiatry;  Laterality: Right;  . LEFT HEART CATH AND CORS/GRAFTS ANGIOGRAPHY N/A 04/28/2017   Procedure: LEFT HEART CATH AND CORONARY ANGIOGRAPHY;  Surgeon: Wellington Hampshire, MD;  Location:  [place] CV LAB;  Service: Cardiovascular;  Laterality: N/A;  . LOWER EXTREMITY ANGIOGRAPHY Right 03/03/2018   Procedure: LOWER EXTREMITY ANGIOGRAPHY;  Surgeon: Katha Cabal, MD;  Location: [place] CV [facility];  Service: Cardiovascular;  Laterality: Right;  . LOWER EXTREMITY ANGIOGRAPHY Left 08/24/2018   Procedure: LOWER EXTREMITY ANGIOGRAPHY;  Surgeon: Algernon Huxley, MD;  Location: [place] CV LAB;  Service: Cardiovascular;  Laterality: Left;  . PERIPHERAL ARTERIAL STENT GRAFT     x2 left/right  . PERIPHERAL VASCULAR BALLOON ANGIOPLASTY Right 01/06/2018   Procedure: PERIPHERAL VASCULAR BALLOON ANGIOPLASTY;  Surgeon: Katha Cabal, MD;  Location: [place] CV LAB;  Service: Cardiovascular;  Laterality: Right;  . PERIPHERAL VASCULAR CATHETERIZATION N/A 07/06/2015   Procedure: Abdominal Aortogram w/Lower Extremity;  Surgeon: Algernon Huxley, MD;  Location: [place] CV LAB;  Service: Cardiovascular;  Laterality: N/A;  . PERIPHERAL VASCULAR CATHETERIZATION  07/06/2015   Procedure: Lower Extremity Intervention;  Surgeon: Algernon Huxley, MD;  Location: [place] CV LAB;  Service: Cardiovascular;;     Current Meds  Medication Sig  . aspirin EC 81 MG tablet Take 1 tablet (81 mg total) by mouth daily.  [name] atorvastatin (LIPITOR) 80 MG tablet Take 80 mg by mouth daily.  . calcium carbonate (TUMS - DOSED IN MG ELEMENTAL CALCIUM) 500 MG chewable tablet Chew 4 tablets by mouth daily as needed for indigestion or heartburn.  . clopidogrel (PLAVIX) 75 MG tablet Take 75 mg by mouth daily.   . diphenhydrAMINE (BENADRYL) 25 MG tablet Take 25 mg by mouth daily as needed for allergies.  . fluticasone furoate-vilanterol (BREO ELLIPTA) 100-25 MCG/INH AEPB Inhale 1 puff into the lungs daily.   [name] gabapentin (NEURONTIN) 800 MG tablet Take 800 mg by mouth 4 (four) times daily.   . Insulin Glargine (LANTUS SOLOSTAR) 100 UNIT/ML Solostar Pen Inject 30 Units into the skin at bedtime.  . insulin lispro  (HUMALOG) 100 UNIT/ML injection Sliding Scale Insulin before meals using Novolog If BS is 0 to 70 no insulin and eat/glucose tablets/glucose get If BS 71 to 150 great! If BS 151 to 200, 2 Units Novolog If BS 201 to 250, 4 Units Novolog If BS 251 to 300, 6 Units Novolog If BS 301 to 350, 8 Units Novolog If BS 351 to 400, 10 Units Novolog If BS greater than 400, take 10 Units and Contact our office during office hour  . ipratropium-albuterol (DUONEB) 0.5-2.5 (3) MG/3ML SOLN Inhale 3 mLs into the lungs every 6 (six) hours as needed (wheezing/sob).   . isosorbide mononitrate (IMDUR) 30 MG 24 hr tablet Take 1 tablet (30 mg total) by mouth daily.  . metFORMIN (GLUCOPHAGE-XR) 500 MG 24 hr tablet Take 500 mg by mouth 2 (two) times daily.   . metoprolol succinate (TOPROL-XL) 25 MG 24 hr tablet Take 25 mg by mouth daily.   . Multiple Vitamin (MULTI-VITAMINS) TABS Take 1 tablet by mouth daily.   . multivitamin-lutein (OCUVITE-LUTEIN) CAPS capsule Take 1 capsule by mouth daily.  . nitroGLYCERIN (NITROSTAT) 0.4 MG SL tablet Place 0.4 mg under the tongue every 5 (five) minutes as needed for chest pain.  . Oxycodone HCl 10 MG TABS Take 10 mg by mouth every 6 (six) hours as needed (pain).   . potassium chloride (K-DUR) 10 MEQ tablet Take 2 tablets (20 mEq total) by mouth daily.  . promethazine (PHENERGAN) 25 MG tablet TAKE 1 TABLET BY MOUTH EVERY 6 HOURS AS NEEDED NAUSEA  . spironolactone (ALDACTONE) 25 MG tablet Take 25 mg by mouth daily.   [name] torsemide (DEMADEX) 20 MG tablet Take 2 tablets (40 mg total) by mouth 2 (two) times daily.  . traZODone (DESYREL) 50 MG tablet Take 50 mg by mouth at bedtime as needed for sleep.      Allergies:   Dulaglutide, Other, and Prednisone   Social History   Tobacco Use  . Smoking status: Current Every Day Smoker    Packs/day: 1.00    Years: 41.00    Pack years: 41.00    Types: Cigarettes  . Smokeless tobacco: Never Used  Substance Use Topics  . Alcohol use: No  . Drug  use: No     Family Hx: The patient's family history includes Heart attack in his father; Heart disease in his father; Hyperlipidemia in his father and mother; Hypertension in his father and  mother.  ROS:   Please see the history of present illness.     All other systems reviewed and are negative.   Labs/Other Tests and Data Reviewed:    Recent Labs: 12/21/2017: TSH 0.533 03/31/2018: ALT 18 04/06/2018: Magnesium 2.2 11/05/2018: B Natriuretic Peptide 1,399.0 11/06/2018: Hemoglobin 10.9; Platelets 275 11/09/2018: BUN 27; Creatinine, Ser 1.22; Potassium 4.4; Sodium 129   Recent Lipid Panel Lab Results  Component Value Date/Time   CHOL 80 11/09/2018 03:08 AM   CHOL 211 (H) 04/05/2013 05:20 AM   TRIG 42 11/09/2018 03:08 AM   TRIG 321 (H) 04/05/2013 05:20 AM   HDL 26 (L) 11/09/2018 03:08 AM   HDL 34 (L) 04/05/2013 05:20 AM   CHOLHDL 3.1 11/09/2018 03:08 AM   LDLCALC 46 11/09/2018 03:08 AM   LDLCALC 113 (H) 04/05/2013 05:20 AM    Wt Readings from Last 3 Encounters:  11/19/18 237 lb (107.5 kg)  11/09/18 235 lb 4.8 oz (106.7 kg)  10/15/18 230 lb (104.3 kg)     Exam:    Vital Signs:  Wt 237 lb (107.5 kg) Comment: self-reported  BMI 37.12 kg/m    Well nourished, well developed male in no  acute distress.   ASSESSMENT & PLAN:    1. Chronic heart failure with reduced ejection fraction- - NYHA class III - fluid overloaded based on patient's description of symptoms although he says edema is better since diuretic was changed - not weighing consistently and he was instructed to weigh daily so that he can call for an overnight weight gain of >2 pounds or a weekly weight gain of >5 pounds - not adding salt to his food - hard to get an accurate amount of his daily fluid intake as one minute he says ~ 72 ounces/ day and another minute he says it's ~ 92 ounces - explained that he needed to keep daily fluid intake to 60 ounces daily - will see patient in clinic next week and check BMP  at that time - he has to reschedule the home health nurse - discussed paramedicine program although he says that he's not interested in that at this time - BNP 11/05/2018 was 1399.0  2: HTN- - not checking his BP at home - BMP from 11/09/2018 reviewed and showed sodium 129, potassium 4.4, creatinine 1.22 and GFR >60 - saw PCP Vickki Muff) 11/13/2018  3: DM- - A1c 11/21/17 was 8.5% - say has been running <200'ss that his glucose  4: Tobacco use-  - smoking 2 ppd of cigarettes daily and doesn't want to quit at this time - cessation discussed for 2 minutes with him  COVID-19 Education: The signs and symptoms of COVID-19 were discussed with the patient and how to seek care for testing (follow up with PCP or arrange E-visit).  The importance of social distancing was discussed today.  Patient Risk:   After full review of this patients clinical status, I feel that they are at least moderate risk at this time.  Time:   Today, I have spent 15 minutes with the patient with telehealth technology discussing medications, diet and symptoms to report.     Medication Adjustments/Labs and Tests Ordered: Current medicines are reviewed at length with the patient today although patient didn't have medications readily available.  Concerns regarding medicines are outlined above.   Tests Ordered: No orders of the defined types were placed in this encounter.  Medication Changes: No orders of the defined types were placed in this encounter.   Disposition:  Return next week or sooner for any questions/problems before then.   Signed, Alisa Graff, FNP  11/19/2018 12:53 PM    [facility]

## 2018-11-19 NOTE — Telephone Encounter (Signed)
Patient did not show for his [facility] appointment on 11/19/2018. Will attempt to reschedule.

## 2018-11-19 NOTE — Patient Instructions (Signed)
Continue weighing daily and call for an overnight weight gain of > 2 pounds or a weekly weight gain of >5 pounds. 

## 2018-11-25 ENCOUNTER — Encounter: Payer: Self-pay | Admitting: Family

## 2018-11-25 ENCOUNTER — Other Ambulatory Visit: Payer: Self-pay | Admitting: Family

## 2018-11-25 ENCOUNTER — Ambulatory Visit
Admission: RE | Admit: 2018-11-25 | Discharge: 2018-11-25 | Disposition: A | Discharge status: Home / Self Care | Payer: Medicare Other | Source: Ambulatory Visit | Attending: Family | Admitting: Family

## 2018-11-25 ENCOUNTER — Other Ambulatory Visit: Payer: Self-pay

## 2018-11-25 ENCOUNTER — Ambulatory Visit: Payer: Medicare Other | Admitting: Family

## 2018-11-25 VITALS — BP 115/78 | HR 109 | Temp 98.2°F | Resp 18 | Ht 67.0 in | Wt 228.0 lb

## 2018-11-25 DIAGNOSIS — I5023 Acute on chronic systolic (congestive) heart failure: Secondary | ICD-10-CM | POA: Diagnosis not present

## 2018-11-25 DIAGNOSIS — G4733 Obstructive sleep apnea (adult) (pediatric): Secondary | ICD-10-CM | POA: Insufficient documentation

## 2018-11-25 DIAGNOSIS — M1990 Unspecified osteoarthritis, unspecified site: Secondary | ICD-10-CM | POA: Insufficient documentation

## 2018-11-25 DIAGNOSIS — Z7982 Long term (current) use of aspirin: Secondary | ICD-10-CM | POA: Insufficient documentation

## 2018-11-25 DIAGNOSIS — N400 Enlarged prostate without lower urinary tract symptoms: Secondary | ICD-10-CM | POA: Insufficient documentation

## 2018-11-25 DIAGNOSIS — E1151 Type 2 diabetes mellitus with diabetic peripheral angiopathy without gangrene: Secondary | ICD-10-CM | POA: Insufficient documentation

## 2018-11-25 DIAGNOSIS — F17210 Nicotine dependence, cigarettes, uncomplicated: Secondary | ICD-10-CM | POA: Insufficient documentation

## 2018-11-25 DIAGNOSIS — Z794 Long term (current) use of insulin: Secondary | ICD-10-CM | POA: Insufficient documentation

## 2018-11-25 DIAGNOSIS — G2581 Restless legs syndrome: Secondary | ICD-10-CM | POA: Insufficient documentation

## 2018-11-25 DIAGNOSIS — Z7902 Long term (current) use of antithrombotics/antiplatelets: Secondary | ICD-10-CM | POA: Insufficient documentation

## 2018-11-25 DIAGNOSIS — Z888 Allergy status to other drugs, medicaments and biological substances status: Secondary | ICD-10-CM | POA: Insufficient documentation

## 2018-11-25 DIAGNOSIS — I2510 Atherosclerotic heart disease of native coronary artery without angina pectoris: Secondary | ICD-10-CM | POA: Insufficient documentation

## 2018-11-25 DIAGNOSIS — I255 Ischemic cardiomyopathy: Secondary | ICD-10-CM | POA: Insufficient documentation

## 2018-11-25 DIAGNOSIS — I110 Hypertensive heart disease with heart failure: Secondary | ICD-10-CM | POA: Insufficient documentation

## 2018-11-25 DIAGNOSIS — Z79899 Other long term (current) drug therapy: Secondary | ICD-10-CM | POA: Insufficient documentation

## 2018-11-25 DIAGNOSIS — E785 Hyperlipidemia, unspecified: Secondary | ICD-10-CM | POA: Insufficient documentation

## 2018-11-25 DIAGNOSIS — Z8249 Family history of ischemic heart disease and other diseases of the circulatory system: Secondary | ICD-10-CM | POA: Insufficient documentation

## 2018-11-25 DIAGNOSIS — Z955 Presence of coronary angioplasty implant and graft: Secondary | ICD-10-CM | POA: Insufficient documentation

## 2018-11-25 DIAGNOSIS — J449 Chronic obstructive pulmonary disease, unspecified: Secondary | ICD-10-CM | POA: Insufficient documentation

## 2018-11-25 DIAGNOSIS — Z8631 Personal history of diabetic foot ulcer: Secondary | ICD-10-CM | POA: Insufficient documentation

## 2018-11-25 DIAGNOSIS — E1140 Type 2 diabetes mellitus with diabetic neuropathy, unspecified: Secondary | ICD-10-CM | POA: Insufficient documentation

## 2018-11-25 DIAGNOSIS — Z6835 Body mass index (BMI) 35.0-35.9, adult: Secondary | ICD-10-CM | POA: Insufficient documentation

## 2018-11-25 LAB — BASIC METABOLIC PANEL
Anion gap: 11 (ref 5–15)
BUN: 17 mg/dL (ref 6–20)
CO2: 29 mmol/L (ref 22–32)
Calcium: 9.1 mg/dL (ref 8.9–10.3)
Chloride: 96 mmol/L — ABNORMAL LOW (ref 98–111)
Creatinine, Ser: 1.12 mg/dL (ref 0.61–1.24)
GFR calc Af Amer: >60 mL/min (ref >60)
GFR calc non Af Amer: >60 mL/min (ref >60)
Glucose, Bld: 127 mg/dL — ABNORMAL HIGH (ref 70–99)
Potassium: 4.5 mmol/L (ref 3.5–5.1)
Sodium: 136 mmol/L (ref 135–145)

## 2018-11-25 LAB — BRAIN NATRIURETIC PEPTIDE: B Natriuretic Peptide: 2086 pg/mL — ABNORMAL HIGH (ref 0.0–100.0)

## 2018-11-25 MED ORDER — SODIUM CHLORIDE FLUSH 0.9 % IV SOLN
INTRAVENOUS | Status: AC
  Filled 2018-11-25: qty 10

## 2018-11-25 MED ORDER — FUROSEMIDE 10 MG/ML IJ SOLN
80.0000 mg | Freq: Once | INTRAMUSCULAR | Status: AC
  Administered 2018-11-25: 80 mg via INTRAVENOUS

## 2018-11-25 MED ORDER — FUROSEMIDE 10 MG/ML IJ SOLN
INTRAMUSCULAR | Status: AC
  Administered 2018-11-25: 80 mg via INTRAVENOUS
  Filled 2018-11-25: qty 8

## 2018-11-25 MED ORDER — POTASSIUM CHLORIDE CRYS ER 20 MEQ PO TBCR
40.0000 meq | EXTENDED_RELEASE_TABLET | Freq: Once | ORAL | Status: AC
  Administered 2018-11-25: 40 meq via ORAL

## 2018-11-25 MED ORDER — POTASSIUM CHLORIDE CRYS ER 20 MEQ PO TBCR
EXTENDED_RELEASE_TABLET | ORAL | Status: AC
  Administered 2018-11-25: 40 meq via ORAL
  Filled 2018-11-25: qty 2

## 2018-11-25 NOTE — Progress Notes (Signed)
Patient ID: [name] [name], male    DOB: 23-Oct-1960, 58 y.o.   MRN: [id number]  HPI  [name] [name] is a 58 y/o male with a history of obstructive sleep apnea, PVD, PAD, HTN, hyperlipidemia, DM, CAD, COPD, BPH, asthma, current tobacco use and chronic heart failure.   Echo report from 11/06/2018 reviewed and showed an EF of 20-25% along with moderate TR and a PA pressure of 59.6 mmHg. Echo report from 04/25/17 reviewed and shows an EF of 25-30% along with a PA pressure of 34 mm Hg. EF has declined from 55-60% back in 2016.   Cardiac catheterization done 04/28/17 showed significant three-vessel disease with a patient stent in the RCA and LAD. Significant proximal RCS disease and the stent as well as diffuse mid and distal disease. LAD has moderate disease. Severely elevated left ventricular end-diastolic pressure at 34 mmHg. Possible CABG in the future with optimizing medical management. Stress done in 2015.  Admitted 11/05/2018 due to acute on chronic HF. Cardiology consult obtained. Aggressively diuresed and discharged after 4 days.   He presents today for a follow-up visit although hasn't been seen in clinic since 2018. Telemedicine visit done recently. He presents with a chief complaint of moderate shortness of breath upon minimal exertion. He describes this as chronic in nature but has worsened recently. He had his diuretic changed a couple of weeks ago and was doing well up until a few days ago. He has associated fatigue, cough, pedal edema, abdominal distention, light-headedness, difficulty sleeping and weight gain. He denies any palpitations, chest pain or wheezing.    Past Medical History:  Diagnosis Date  . Arthritis   . Asthma   . Atherosclerosis   . BPH (benign prostatic hyperplasia)   . Carotid arterial disease ([place])   . Charcot's joint of foot, right   . Chronic combined systolic (congestive) and diastolic (congestive) heart failure ([place])    a. 02/2013 EF 50% by LV gram; b. 04/2017 Echo:  EF 25-30%. diff HK. Gr2 DD. Mod dil LA/RV. PASP 91mmHg.  [name] COPD (chronic obstructive pulmonary disease) ([place])   . Coronary artery disease    a. 2013 S/P PCI of LAD [facility] [facility]);  b. 03/2013 PCI: RCA 90p ( 2.5 x 23 mm DES); c. 02/2014 Cath: patent RCA stent->Med Rx; d. 04/2017 Cath: LM nl, LAd 30ost/p, 32m/d, D1 80ost, LCX 90p/m, OM1 85, RCA 70/20p, 78m, 70d->Referred for CT Surg-felt to be poor candidate.  . Diabetic neuropathy ([place])   . Diabetic ulcer of right foot ([place])   . Hernia   . Hyperlipidemia   . Hypertension   . Ischemic cardiomyopathy    a. 04/2017 Echo: EF 25-30%; b. 08/2017 Cardiac MRI ([facility]): EF 19%, sev glob HK. RVEF 20%, mod BAE, triv [name], mild-mod TR. Basal lateral subendocardial infarct (viable), inf/infsept, dist septal ischemia, basal to mid lat peri-infarct ischemia.  . Morbid obesity ([place])   . Neuropathy   . PAD (peripheral artery disease) ([place])    a. Followed by Dr. Lucky Cowboy; b. 08/2010 Periph Angio: RSFA 70-80p (6X60 self-expanding stent); c. 09/2010 Periph Angio: L SFA 100p (7x unknown length self-expanding stent); d. 06/2015 Periph Angio: R SFA short segment occlusion (6x12 self-expanding stent).  . Restless leg syndrome   . Sleep apnea   . Subclavian artery stenosis, right ([place])   . Syncope and collapse   . Tobacco use    a. 75+ yr hx - still smoking 1ppd, down from 2 ppd.  . Type II diabetes mellitus ([place])   .  Varicose vein    Past Surgical History:  Procedure Laterality Date  . ABDOMINAL AORTAGRAM N/A 06/23/2013   Procedure: ABDOMINAL Maxcine Ham;  Surgeon: Wellington Hampshire, MD;  Location: [facility] CATH [facility];  Service: Cardiovascular;  Laterality: N/A;  . AMPUTATION Right 04/01/2018   Procedure: AMPUTATION BELOW KNEE;  Surgeon: Algernon Huxley, MD;  Location: [facility] ORS;  Service: Vascular;  Laterality: Right;  . APPENDECTOMY    . CARDIAC CATHETERIZATION  10/14   [facility]; X1 STENT PROXIMAL RCA  . CARDIAC CATHETERIZATION  02/2010   [facility]  . CARDIAC CATHETERIZATION  02/21/2014   [facility]  .  CLAVICLE SURGERY    . HERNIA REPAIR    . IRRIGATION AND DEBRIDEMENT FOOT Right 01/04/2018   Procedure: IRRIGATION AND DEBRIDEMENT FOOT;  Surgeon: Samara Deist, DPM;  Location: [facility] OR[facility];  Service: Podiatry;  Laterality: Right;  . LEFT HEART CATH AND CORS/GRAFTS ANGIOGRAPHY N/A 04/28/2017   Procedure: LEFT HEART CATH AND CORONARY ANGIOGRAPHY;  Surgeon: Wellington Hampshire, MD;  Location: [facility] CV LAB;  Service: Cardiovascular;  Laterality: N/A;  . LOWER EXTREMITY ANGIOGRAPHY Right 03/03/2018   Procedure: LOWER EXTREMITY ANGIOGRAPHY;  Surgeon: Katha Cabal, MD;  Location: [facility] CV [facility];  Service: Cardiovascular;  Laterality: Right;  . LOWER EXTREMITY ANGIOGRAPHY Left 08/24/2018   Procedure: LOWER EXTREMITY ANGIOGRAPHY;  Surgeon: Algernon Huxley, MD;  Location: [facility];  Service: Cardiovascular;  Laterality: Left;  . PERIPHERAL ARTERIAL STENT GRAFT     x2 left/right  . PERIPHERAL VASCULAR BALLOON ANGIOPLASTY Right 01/06/2018   Procedure: PERIPHERAL VASCULAR BALLOON ANGIOPLASTY;  Surgeon: Katha Cabal, MD;  Location: [place] [facility];  Service: Cardiovascular;  Laterality: Right;  . PERIPHERAL VASCULAR CATHETERIZATION N/A 07/06/2015   Procedure: Abdominal Aortogram w/Lower Extremity;  Surgeon: Algernon Huxley, MD;  Location: [facility] CV [facility];  Service: Cardiovascular;  Laterality: N/A;  . PERIPHERAL VASCULAR CATHETERIZATION  07/06/2015   Procedure: Lower Extremity Intervention;  Surgeon: Algernon Huxley, MD;  Location: [place] [facility];  Service: Cardiovascular;;   Family History  Problem Relation Age of Onset  . Heart attack Father   . Heart disease Father   . Hypertension Father   . Hyperlipidemia Father   . Hypertension Mother   . Hyperlipidemia Mother    Social History   Tobacco Use  . Smoking status: Current Every Day Smoker    Packs/day: 1.00    Years: 41.00    Pack years: 41.00    Types: Cigarettes  . Smokeless tobacco: Never Used  Substance  Use Topics  . Alcohol use: No   Allergies  Allergen Reactions  . Dulaglutide Anaphylaxis, Diarrhea and Hives  . Other Itching    Skin itching associated with nitro patch  . Prednisone Rash   Prior to Admission medications   Medication Sig Start Date End Date Taking? Authorizing Provider  albuterol (VENTOLIN HFA) 108 (90 Base) MCG/ACT inhaler Inhale into the lungs every 6 (six) hours as needed for wheezing or shortness of breath.   Yes [provider]  aspirin EC 81 MG tablet Take 1 tablet (81 mg total) by mouth daily. 05/11/15  Yes Wellington Hampshire, MD  atorvastatin (LIPITOR) 80 MG tablet Take 80 mg by mouth daily.   Yes [provider]  calcium carbonate (TUMS - DOSED IN MG ELEMENTAL CALCIUM) 500 MG chewable tablet Chew 4 tablets by mouth daily as needed for indigestion or heartburn.   Yes [provider]  clopidogrel (PLAVIX) 75 MG  tablet Take 75 mg by mouth daily.  08/22/17  Yes [provider]  diphenhydrAMINE (BENADRYL) 25 MG tablet Take 25 mg by mouth daily as needed for allergies.   Yes [provider]  ezetimibe (ZETIA) 10 MG tablet Take 10 mg by mouth daily.   Yes [provider]  gabapentin (NEURONTIN) 800 MG tablet Take 800 mg by mouth 4 (four) times daily.  08/14/17 11/25/18 Yes [provider]  Insulin Glargine (LANTUS SOLOSTAR) 100 UNIT/ML Solostar Pen Inject 30 Units into the skin at bedtime. 04/07/18  Yes Mayo, Pete Pelt, MD  insulin lispro (HUMALOG) 100 UNIT/ML injection Sliding Scale Insulin before meals using Novolog If BS is 0 to 70 no insulin and eat/glucose tablets/glucose get If BS 71 to 150 great! If BS 151 to 200, 2 Units Novolog If BS 201 to 250, 4 Units Novolog If BS 251 to 300, 6 Units Novolog If BS 301 to 350, 8 Units Novolog If BS 351 to 400, 10 Units Novolog If BS greater than 400, take 10 Units and Contact our office during office hour 04/14/18  Yes [provider]  ipratropium-albuterol (DUONEB)  0.5-2.5 (3) MG/3ML SOLN Inhale 3 mLs into the lungs every 6 (six) hours as needed (wheezing/sob).    Yes [provider]  isosorbide mononitrate (IMDUR) 30 MG 24 hr tablet Take 1 tablet (30 mg total) by mouth daily. 11/10/18  Yes Dustin Flock, MD  metFORMIN (GLUCOPHAGE-XR) 500 MG 24 hr tablet Take 1,000 mg by mouth 2 (two) times daily.    Yes [provider]  metoprolol succinate (TOPROL-XL) 25 MG 24 hr tablet Take 12.5 mg by mouth daily.  03/19/18 03/19/19 Yes [provider]  Multiple Vitamin (MULTI-VITAMINS) TABS Take 1 tablet by mouth daily.    Yes [provider]  nitroGLYCERIN (NITROSTAT) 0.4 MG SL tablet Place 0.4 mg under the tongue every 5 (five) minutes as needed for chest pain.   Yes [provider]  Oxycodone HCl 10 MG TABS Take 10 mg by mouth every 6 (six) hours as needed (pain).  01/15/18  Yes [provider]  potassium chloride (K-DUR) 10 MEQ tablet Take 2 tablets (20 mEq total) by mouth daily. 11/18/18  Yes Gollan, Kathlene November, MD  promethazine (PHENERGAN) 25 MG tablet TAKE 1 TABLET BY MOUTH EVERY 6 HOURS AS NEEDED NAUSEA 04/29/18  Yes [provider]  torsemide (DEMADEX) 20 MG tablet Take 2 tablets (40 mg total) by mouth 2 (two) times daily. Patient taking differently: Take by mouth 2 (two) times daily. Taking 60mg  AM and 40mg  PM 11/18/18  Yes Gollan, Kathlene November, MD  traZODone (DESYREL) 50 MG tablet Take 50 mg by mouth at bedtime as needed for sleep.  08/12/17  Yes [provider]  multivitamin-lutein (OCUVITE-LUTEIN) CAPS capsule Take 1 capsule by mouth daily. Patient not taking: Reported on 11/25/2018 01/09/18   Demetrios Loll, MD  spironolactone (ALDACTONE) 25 MG tablet Take 25 mg by mouth daily.  03/02/18   [provider]    Review of Systems  Constitutional: Positive for fatigue (easily). Negative for appetite change.  HENT: Negative for congestion, postnasal drip and sore throat.   Eyes: Negative.    Respiratory: Positive for cough and shortness of breath (with minimal exertion). Negative for chest tightness and wheezing.   Cardiovascular: Positive for leg swelling. Negative for chest pain and palpitations.  Gastrointestinal: Positive for abdominal distention. Negative for abdominal pain.  Endocrine: Negative.   Genitourinary: Negative.   Musculoskeletal: Positive for  back pain. Negative for neck pain.       Has right lower leg prosthesis  Skin: Negative.   Allergic/Immunologic: Negative.   Neurological: Positive for light-headedness (at times). Negative for dizziness.  Hematological: Negative for adenopathy. Bruises/bleeds easily.  Psychiatric/Behavioral: Positive for sleep disturbance (sleeping on 3 pillows). Negative for dysphoric mood. The patient is not nervous/anxious.    Vitals:   11/25/18 1232  BP: 115/78  Pulse: (!) 109  Resp: 18  Temp: 98.2 F (36.8 C)  SpO2: 98%  Weight: 228 lb (103.4 kg)  Height: 5\' 7"  (1.702 m)   Wt Readings from Last 3 Encounters:  11/25/18 228 lb (103.4 kg)  11/19/18 237 lb (107.5 kg)  11/09/18 235 lb 4.8 oz (106.7 kg)   Lab Results  Component Value Date   CREATININE 1.22 11/09/2018   CREATININE 1.09 11/08/2018   CREATININE 0.92 11/06/2018    Physical Exam  Constitutional: He is oriented to person, place, and time. He appears well-developed and well-nourished.  HENT:  Head: Normocephalic and atraumatic.  Neck: Normal range of motion. Neck supple. No JVD present.  Cardiovascular: Regular rhythm. Tachycardia present.  Pulmonary/Chest: Effort normal. He has no wheezes. He has no rales.  Abdominal: Soft. He exhibits distension. There is no abdominal tenderness.  Musculoskeletal:        General: Edema (3+ pitting edema in left lower leg to the knee) present. No tenderness.  Neurological: He is alert and oriented to person, place, and time.  Skin: Skin is warm and dry.  Psychiatric: He has a normal mood and affect. His behavior is  normal. Thought content normal.  Nursing note and vitals reviewed.   Assessment & Plan:  1: Acute on Chronic heart failure with reduced ejection fraction- - NYHA class III - moderately fluid overloaded today with pitting edema to his knee - weighing daily and he was reminded to call for an overnight weight gain of >2 pounds or a weekly weight gain of >5 pounds - will send for 80mg  IV lasix/ 108meq potassium - BMP/BNP to be drawn today - not adding salt and has been using Mrs. Dash & trying to read food labels.  - has [place] - saw cardiologist ([place]) 02/19/18 - BNP 11/05/2018 was 1399.0  2: HTN- - BP looks good today - saw PCP Vickki Muff) 11/13/2018 - BMP done 11/09/2018 reviewed and shows sodium 129, potassium 4.4, creatinine 1.22 and GFR >60  3: Diabetes- - glucose at home was 169 this morning - A1c on 11/21/17 was 8.5%  4: Tobacco use- - currently smoking <1/2 ppd of cigarettes - complete cessation discussed for 3 minutes with him  Patient did not bring his medications nor a list. Each medication was verbally reviewed with the patient and he was encouraged to bring the bottles to every visit to confirm accuracy of list.  Return in 2 weeks or sooner for any questions/problems before then. Since HF provider will be out of the office next week, will ask patient's PCP to see patient next week.

## 2018-11-25 NOTE — Patient Instructions (Signed)
Continue weighing daily and call for an overnight weight gain of > 2 pounds or a weekly weight gain of >5 pounds. 

## 2018-11-26 ENCOUNTER — Other Ambulatory Visit: Payer: Self-pay

## 2018-11-26 ENCOUNTER — Encounter: Payer: Self-pay | Admitting: Family

## 2018-11-26 ENCOUNTER — Telehealth: Payer: Self-pay | Admitting: Family

## 2018-11-26 ENCOUNTER — Other Ambulatory Visit: Payer: Self-pay | Admitting: Family

## 2018-11-26 ENCOUNTER — Ambulatory Visit
Admission: RE | Admit: 2018-11-26 | Discharge: 2018-11-26 | Disposition: A | Discharge status: Home / Self Care | Payer: Medicare Other | Source: Ambulatory Visit | Attending: Family | Admitting: Family

## 2018-11-26 ENCOUNTER — Telehealth: Payer: Self-pay

## 2018-11-26 DIAGNOSIS — I5023 Acute on chronic systolic (congestive) heart failure: Secondary | ICD-10-CM | POA: Diagnosis not present

## 2018-11-26 LAB — BASIC METABOLIC PANEL
Anion gap: 10 (ref 5–15)
BUN: 18 mg/dL (ref 6–20)
CO2: 30 mmol/L (ref 22–32)
Calcium: 8.8 mg/dL — ABNORMAL LOW (ref 8.9–10.3)
Chloride: 90 mmol/L — ABNORMAL LOW (ref 98–111)
Creatinine, Ser: 1.28 mg/dL — ABNORMAL HIGH (ref 0.61–1.24)
GFR calc Af Amer: >60 mL/min (ref >60)
GFR calc non Af Amer: >60 mL/min (ref >60)
Glucose, Bld: 209 mg/dL — ABNORMAL HIGH (ref 70–99)
Potassium: 4.5 mmol/L (ref 3.5–5.1)
Sodium: 130 mmol/L — ABNORMAL LOW (ref 135–145)

## 2018-11-26 LAB — BRAIN NATRIURETIC PEPTIDE: B Natriuretic Peptide: 2041 pg/mL — ABNORMAL HIGH (ref 0.0–100.0)

## 2018-11-26 MED ORDER — POTASSIUM CHLORIDE CRYS ER 20 MEQ PO TBCR
40.0000 meq | EXTENDED_RELEASE_TABLET | Freq: Once | ORAL | Status: AC
  Administered 2018-11-26: 40 meq via ORAL

## 2018-11-26 MED ORDER — SODIUM CHLORIDE FLUSH 0.9 % IV SOLN
INTRAVENOUS | Status: AC
  Filled 2018-11-26: qty 20

## 2018-11-26 MED ORDER — FUROSEMIDE 10 MG/ML IJ SOLN
INTRAMUSCULAR | Status: AC
  Administered 2018-11-26: 80 mg via INTRAVENOUS
  Filled 2018-11-26: qty 8

## 2018-11-26 MED ORDER — POTASSIUM CHLORIDE CRYS ER 20 MEQ PO TBCR
EXTENDED_RELEASE_TABLET | ORAL | Status: AC
  Filled 2018-11-26: qty 2

## 2018-11-26 MED ORDER — FUROSEMIDE 10 MG/ML IJ SOLN
80.0000 mg | Freq: Once | INTRAMUSCULAR | Status: AC
  Administered 2018-11-26: 80 mg via INTRAVENOUS

## 2018-11-26 NOTE — Telephone Encounter (Signed)
Patient received 80mg  IV lasix/ 32meq potassium yesterday after his HF Clinic visit. Labs from yesterday showed sodium 136, potassium 4.5, creatinine 1.12 and GFR >60.  BNP was 2086.0  Called patient and he said that he's lost 4 pounds from yesterday but continues to have quite a bit of swelling in his legs/ abdomen. Will have patient return today for additional 80mg  IV lasix/ 41meq potassium. Will check BNP/ BMP afterwards as well.   Patient to see PCP next week and the HF Clinic in 2 weeks.

## 2018-11-26 NOTE — Telephone Encounter (Signed)
Called patient to schedule an appointment with Dr. Rockey Situ.  No answer. No VM.

## 2018-11-30 NOTE — Telephone Encounter (Signed)
Attempted to schedule no ans no vm  

## 2018-12-02 NOTE — Telephone Encounter (Signed)
Called patient   No answer   No VM

## 2018-12-08 NOTE — Telephone Encounter (Signed)
Spoke with patient and scheduled virtual appointment via telephone visit with Dr. Rockey Situ on Monday. He does not have a smart phone and prefers just telephone call to number listed. Reviewed consent below to patient and he was agreeable to proceed with virtual visit. Confirmed date, time, and instructions to have phone nearby around the time of his appointment.   YOUR CARDIOLOGY TEAM HAS ARRANGED FOR AN E-VISIT FOR YOUR APPOINTMENT - PLEASE REVIEW IMPORTANT INFORMATION BELOW SEVERAL DAYS PRIOR TO YOUR APPOINTMENT  Due to the recent COVID-19 pandemic, we are transitioning in-person office visits to tele-medicine visits in an effort to decrease unnecessary exposure to our patients, their families, and staff. These visits are billed to your insurance just like a normal visit is. We also encourage you to sign up for MyChart if you have not already done so. You will need a smartphone if possible. For patients that do not have this, we can still complete the visit using a regular telephone but do prefer a smartphone to enable video when possible. You may have a family member that lives with you that can help. If possible, we also ask that you have a blood pressure cuff and scale at home to measure your blood pressure, heart rate and weight prior to your scheduled appointment. Patients with clinical needs that need an in-person evaluation and testing will still be able to come to the office if absolutely necessary. If you have any questions, feel free to call our office.    THE DAY OF YOUR APPOINTMENT  Approximately 15 minutes prior to your scheduled appointment, you will receive a telephone call from one of [facility] team - your caller ID may say "Unknown caller."  Our staff will confirm medications, vital signs for the day and any symptoms you may be experiencing. Please have this information available prior to the time of visit start. It may also be helpful for you to have a pad of paper and pen handy for any  instructions given during your visit. They will also walk you through joining the smartphone meeting if this is a video visit.    CONSENT FOR TELE-HEALTH VISIT - PLEASE REVIEW  I hereby voluntarily request, consent and authorize [facility] and its employed or contracted physicians, physician assistants, nurse practitioners or other licensed health care professionals (the Practitioner), to provide me with telemedicine health care services (the "Services") as deemed necessary by the treating Practitioner. I acknowledge and consent to receive the Services by the Practitioner via telemedicine. I understand that the telemedicine visit will involve communicating with the Practitioner through live audiovisual communication technology and the disclosure of certain medical information by electronic transmission. I acknowledge that I have been given the opportunity to request an in-person assessment or other available alternative prior to the telemedicine visit and am voluntarily participating in the telemedicine visit.  I understand that I have the right to withhold or withdraw my consent to the use of telemedicine in the course of my care at any time, without affecting my right to future care or treatment, and that the Practitioner or I may terminate the telemedicine visit at any time. I understand that I have the right to inspect all information obtained and/or recorded in the course of the telemedicine visit and may receive copies of available information for a reasonable fee.  I understand that some of the potential risks of receiving the Services via telemedicine include:  [name] Delay or interruption in medical evaluation due to technological equipment failure or disruption; .  Information transmitted may not be sufficient (e.g. poor resolution of images) to allow for appropriate medical decision making by the Practitioner; and/or  . In rare instances, security protocols could fail, causing a breach of  personal health information.  Furthermore, I acknowledge that it is my responsibility to provide information about my medical history, conditions and care that is complete and accurate to the best of my ability. I acknowledge that Practitioner's advice, recommendations, and/or decision may be based on factors not within their control, such as incomplete or inaccurate data provided by me or distortions of diagnostic images or specimens that may result from electronic transmissions. I understand that the practice of medicine is not an exact science and that Practitioner makes no warranties or guarantees regarding treatment outcomes. I acknowledge that I will receive a copy of this consent concurrently upon execution via email to the email address I last provided but may also request a printed copy by calling the office of [facility].    I understand that my insurance will be billed for this visit.   I have read or had this consent read to me. . I understand the contents of this consent, which adequately explains the benefits and risks of the Services being provided via telemedicine.  . I have been provided ample opportunity to ask questions regarding this consent and the Services and have had my questions answered to my satisfaction. . I give my informed consent for the services to be provided through the use of telemedicine in my medical care  By participating in this telemedicine visit I agree to the above.

## 2018-12-09 ENCOUNTER — Telehealth: Payer: Self-pay

## 2018-12-09 NOTE — Telephone Encounter (Signed)
TELEPHONE CALL NOTE  [name] [name] has been deemed a candidate for a follow-up tele-health visit to limit community exposure during the Covid-19 pandemic. I spoke with the patient via phone to ensure availability of phone/video source, confirm preferred email & phone number, discuss instructions and expectations, and review consent.   I reminded [name] [name] to be prepared with any vital sign and/or heart rhythm information that could potentially be obtained via home monitoring, at the time of his visit.  Finally, I reminded [name] [name] to expect an e-mail containing a link for their video-based visit approximately 15 minutes before his visit, or alternatively, a phone call at the time of his visit if his visit is planned to be a phone encounter.  Did the patient verbally consent to treatment as below? YES  [name] [name], CMA 12/09/2018 1:57 PM  CONSENT FOR TELE-HEALTH VISIT - PLEASE REVIEW  I hereby voluntarily request, consent and authorize [facility] and its employed or contracted physicians, physician assistants, nurse practitioners or other licensed health care professionals (the Practitioner), to provide me with telemedicine health care services (the "Services") as deemed necessary by the treating Practitioner. I acknowledge and consent to receive the Services by the Practitioner via telemedicine. I understand that the telemedicine visit will involve communicating with the Practitioner through telephonic communication technology and the disclosure of certain medical information by electronic transmission. I acknowledge that I have been given the opportunity to request an in-person assessment or other available alternative prior to the telemedicine visit and am voluntarily participating in the telemedicine visit.  I understand that I have the right to withhold or withdraw my consent to the use of telemedicine in the course of my care at any time, without affecting my right  to future care or treatment, and that the Practitioner or I may terminate the telemedicine visit at any time. I understand that I have the right to inspect all information obtained and/or recorded in the course of the telemedicine visit and may receive copies of available information for a reasonable fee.  I understand that some of the potential risks of receiving the Services via telemedicine include:  [name] Delay or interruption in medical evaluation due to technological equipment failure or disruption; . Information transmitted may not be sufficient (e.g. poor resolution of images) to allow for appropriate medical decision making by the Practitioner; and/or  . In rare instances, security protocols could fail, causing a breach of personal health information.  Furthermore, I acknowledge that it is my responsibility to provide information about my medical history, conditions and care that is complete and accurate to the best of my ability. I acknowledge that Practitioner's advice, recommendations, and/or decision may be based on factors not within their control, such as incomplete or inaccurate data provided by me or lack of visual representation. I understand that the practice of medicine is not an exact science and that Practitioner makes no warranties or guarantees regarding treatment outcomes. I acknowledge that I will receive a copy of this consent concurrently upon execution via email to the email address I last provided but may also request a printed copy by calling the office of [facility].    I understand that my insurance may be billed for this visit.   I have read or had this consent read to me. . I understand the contents of this consent, which adequately explains the benefits and risks of the Services being provided via telemedicine.  [name]  I have been provided ample opportunity to ask questions regarding this consent and the Services and have had my questions answered to my  satisfaction. . I give my informed consent for the services to be provided through the use of telemedicine in my medical care  By participating in this telemedicine visit I agree to the above.

## 2018-12-10 ENCOUNTER — Ambulatory Visit: Payer: Medicare Other | Attending: Family | Admitting: Family

## 2018-12-10 ENCOUNTER — Encounter: Payer: Self-pay | Admitting: Family

## 2018-12-10 ENCOUNTER — Other Ambulatory Visit: Payer: Self-pay

## 2018-12-10 VITALS — Wt 197.0 lb

## 2018-12-10 DIAGNOSIS — I5022 Chronic systolic (congestive) heart failure: Secondary | ICD-10-CM

## 2018-12-10 DIAGNOSIS — Z720 Tobacco use: Secondary | ICD-10-CM

## 2018-12-10 DIAGNOSIS — L97509 Non-pressure chronic ulcer of other part of unspecified foot with unspecified severity: Secondary | ICD-10-CM

## 2018-12-10 DIAGNOSIS — Z794 Long term (current) use of insulin: Secondary | ICD-10-CM

## 2018-12-10 DIAGNOSIS — E11621 Type 2 diabetes mellitus with foot ulcer: Secondary | ICD-10-CM

## 2018-12-10 DIAGNOSIS — I10 Essential (primary) hypertension: Secondary | ICD-10-CM

## 2018-12-10 NOTE — Patient Instructions (Addendum)
Continue weighing daily and call for an overnight weight gain of > 2 pounds or a weekly weight gain of >5 pounds.  If weight loss continues, call our office as we may need to decrease your torsemide (fluid pill).

## 2018-12-10 NOTE — Progress Notes (Signed)
Virtual Visit via Telephone Note   Evaluation Performed:  Follow-up visit  This visit type was conducted due to national recommendations for restrictions regarding the COVID-19 Pandemic (e.g. social distancing).  This format is felt to be most appropriate for this patient at this time.  All issues noted in this document were discussed and addressed.  No physical exam was performed (except for noted visual exam findings with Video Visits).  Please refer to the patient's chart (MyChart message for video visits and phone note for telephone visits) for the patient's consent to telehealth for [facility]  Date:  12/10/2018   ID:  [name] [name], DOB 1961-01-12, MRN [id number]  Patient Location:  [place] [address]   Provider location:   [facility] [place] [facility] [address], [place] [address]  PCP:  Clarisse Gouge, MD  Cardiologist:  none Electrophysiologist:  None   Chief Complaint:  Shortness of breath  History of Present Illness:    [name] [name] is a 58 y.o. male who presents via audio/video conferencing for a telehealth visit today.  Patient verified DOB and address.  The patient does not have symptoms concerning for COVID-19 infection (fever, chills, cough, or new SHORTNESS OF BREATH).   Patient reports minimal shortness of breath upon moderate exertion. He says that this has been present for several years but has been improving. He has associated dizziness, infrequent chest pain, cough and fatigue along with this. He denies any swelling in his legs/ abdomen, palpitations, difficulty sleeping or weight gain.   Prior CV studies:   The following studies were reviewed today:  Echo report from 11/06/2018 reviewed and showed an EF of 20-25% along with moderate TR and an elevated PA pressure of 59.6 mm Hg.   Past Medical History:  Diagnosis Date  . Arthritis   . Asthma   . Atherosclerosis   . BPH (benign prostatic hyperplasia)   .  Carotid arterial disease ([place])   . Charcot's joint of foot, right   . Chronic combined systolic (congestive) and diastolic (congestive) heart failure ([place])    a. 02/2013 EF 50% by LV gram; b. 04/2017 Echo: EF 25-30%. diff HK. Gr2 DD. Mod dil LA/RV. PASP 67mmHg.  [name] COPD (chronic obstructive pulmonary disease) ([place])   . Coronary artery disease    a. 2013 S/P PCI of LAD [facility]);  b. 03/2013 PCI: RCA 90p ( 2.5 x 23 mm DES); c. 02/2014 Cath: patent RCA stent->Med Rx; d. 04/2017 Cath: LM nl, LAd 30ost/p, 18m/d, D1 80ost, LCX 90p/m, OM1 85, RCA 70/20p, 8m, 70d->Referred for CT Surg-felt to be poor candidate.  . Diabetic neuropathy ([place])   . Diabetic ulcer of right foot ([place])   . Hernia   . Hyperlipidemia   . Hypertension   . Ischemic cardiomyopathy    a. 04/2017 Echo: EF 25-30%; b. 08/2017 Cardiac MRI ([facility]): EF 19%, sev glob HK. RVEF 20%, mod BAE, triv MR, mild-mod TR. Basal lateral subendocardial infarct (viable), inf/infsept, dist septal ischemia, basal to mid lat peri-infarct ischemia.  . Morbid obesity ([place])   . Neuropathy   . PAD (peripheral artery disease) ([place])    a. Followed by Dr. Lucky Cowboy; b. 08/2010 Periph Angio: RSFA 70-80p (6X60 self-expanding stent); c. 09/2010 Periph Angio: L SFA 100p (7x unknown length self-expanding stent); d. 06/2015 Periph Angio: R SFA short segment occlusion (6x12 self-expanding stent).  . Restless leg syndrome   . Sleep apnea   . Subclavian  artery stenosis, right ([place])   . Syncope and collapse   . Tobacco use    a. 75+ yr hx - still smoking 1ppd, down from 2 ppd.  . Type II diabetes mellitus ([place])   . Varicose vein    Past Surgical History:  Procedure Laterality Date  . ABDOMINAL AORTAGRAM N/A 06/23/2013   Procedure: ABDOMINAL Maxcine Ham;  Surgeon: Wellington Hampshire, MD;  Location: [facility] CATH [facility];  Service: Cardiovascular;  Laterality: N/A;  . AMPUTATION Right 04/01/2018   Procedure: AMPUTATION BELOW KNEE;  Surgeon: Algernon Huxley, MD;  Location: [facility] ORS;  Service:  Vascular;  Laterality: Right;  . APPENDECTOMY    . CARDIAC CATHETERIZATION  10/14   [facility]; X1 STENT PROXIMAL RCA  . CARDIAC CATHETERIZATION  02/2010   [facility]  . CARDIAC CATHETERIZATION  02/21/2014   [facility]  . CLAVICLE SURGERY    . HERNIA REPAIR    . IRRIGATION AND DEBRIDEMENT FOOT Right 01/04/2018   Procedure: IRRIGATION AND DEBRIDEMENT FOOT;  Surgeon: Samara Deist, DPM;  Location: [facility] ORS;  Service: Podiatry;  Laterality: Right;  . LEFT HEART CATH AND CORS/GRAFTS ANGIOGRAPHY N/A 04/28/2017   Procedure: LEFT HEART CATH AND CORONARY ANGIOGRAPHY;  Surgeon: Wellington Hampshire, MD;  Location: [place] CV LAB;  Service: Cardiovascular;  Laterality: N/A;  . LOWER EXTREMITY ANGIOGRAPHY Right 03/03/2018   Procedure: LOWER EXTREMITY ANGIOGRAPHY;  Surgeon: Katha Cabal, MD;  Location: [place] [facility] CV [facility];  Service: Cardiovascular;  Laterality: Right;  . LOWER EXTREMITY ANGIOGRAPHY Left 08/24/2018   Procedure: LOWER EXTREMITY ANGIOGRAPHY;  Surgeon: Algernon Huxley, MD;  Location: [facility] CV LAB;  Service: Cardiovascular;  Laterality: Left;  . PERIPHERAL ARTERIAL STENT GRAFT     x2 left/right  . PERIPHERAL VASCULAR BALLOON ANGIOPLASTY Right 01/06/2018   Procedure: PERIPHERAL VASCULAR BALLOON ANGIOPLASTY;  Surgeon: Katha Cabal, MD;  Location: [facility] CV LAB;  Service: Cardiovascular;  Laterality: Right;  . PERIPHERAL VASCULAR CATHETERIZATION N/A 07/06/2015   Procedure: Abdominal Aortogram w/Lower Extremity;  Surgeon: Algernon Huxley, MD;  Location: [place] CV [facility];  Service: Cardiovascular;  Laterality: N/A;  . PERIPHERAL VASCULAR CATHETERIZATION  07/06/2015   Procedure: Lower Extremity Intervention;  Surgeon: Algernon Huxley, MD;  Location: [place] CV [facility];  Service: Cardiovascular;;     Current Meds  Medication Sig  . albuterol (VENTOLIN HFA) 108 (90 Base) MCG/ACT inhaler Inhale into the lungs every 6 (six) hours as needed for wheezing or shortness of breath.  [name] aspirin EC 81  MG tablet Take 1 tablet (81 mg total) by mouth daily.  [name] atorvastatin (LIPITOR) 80 MG tablet Take 80 mg by mouth daily.  . calcium carbonate (TUMS - DOSED IN MG ELEMENTAL CALCIUM) 500 MG chewable tablet Chew 4 tablets by mouth daily as needed for indigestion or heartburn.  . clopidogrel (PLAVIX) 75 MG tablet Take 75 mg by mouth daily.   . diphenhydrAMINE (BENADRYL) 25 MG tablet Take 25 mg by mouth daily as needed for allergies.  [name] ezetimibe (ZETIA) 10 MG tablet Take 10 mg by mouth daily.  [name] gabapentin (NEURONTIN) 800 MG tablet Take 800 mg by mouth 4 (four) times daily.   . Insulin Glargine (LANTUS SOLOSTAR) 100 UNIT/ML Solostar Pen Inject 30 Units into the skin at bedtime.  . insulin lispro (HUMALOG) 100 UNIT/ML injection Sliding Scale Insulin before meals using Novolog If BS is 0 to 70 no insulin and eat/glucose tablets/glucose get If BS 71 to 150 great! If BS 151 to 200, 2  Units Novolog If BS 201 to 250, 4 Units Novolog If BS 251 to 300, 6 Units Novolog If BS 301 to 350, 8 Units Novolog If BS 351 to 400, 10 Units Novolog If BS greater than 400, take 10 Units and Contact our office during office hour  . ipratropium-albuterol (DUONEB) 0.5-2.5 (3) MG/3ML SOLN Inhale 3 mLs into the lungs every 6 (six) hours as needed (wheezing/sob).   . isosorbide mononitrate (IMDUR) 30 MG 24 hr tablet Take 1 tablet (30 mg total) by mouth daily.  . metFORMIN (GLUCOPHAGE-XR) 500 MG 24 hr tablet Take 1,000 mg by mouth 2 (two) times daily.   . metoprolol succinate (TOPROL-XL) 25 MG 24 hr tablet Take 12.5 mg by mouth daily.   . Multiple Vitamin (MULTI-VITAMINS) TABS Take 1 tablet by mouth daily.   . multivitamin-lutein (OCUVITE-LUTEIN) CAPS capsule Take 1 capsule by mouth daily.  . nitroGLYCERIN (NITROSTAT) 0.4 MG SL tablet Place 0.4 mg under the tongue every 5 (five) minutes as needed for chest pain.  . Oxycodone HCl 10 MG TABS Take 10 mg by mouth every 6 (six) hours as needed (pain).   . potassium chloride (K-DUR) 10  MEQ tablet Take 2 tablets (20 mEq total) by mouth daily.  . promethazine (PHENERGAN) 25 MG tablet TAKE 1 TABLET BY MOUTH EVERY 6 HOURS AS NEEDED NAUSEA  . spironolactone (ALDACTONE) 25 MG tablet Take 25 mg by mouth daily.   [name] torsemide (DEMADEX) 20 MG tablet Take 2 tablets (40 mg total) by mouth 2 (two) times daily. (Patient taking differently: Take by mouth 2 (two) times daily. Taking 60mg  AM and 40mg  PM)  . traZODone (DESYREL) 50 MG tablet Take 50 mg by mouth at bedtime as needed for sleep.      Allergies:   Dulaglutide, Other, and Prednisone   Social History   Tobacco Use  . Smoking status: Current Every Day Smoker    Packs/day: 1.00    Years: 41.00    Pack years: 41.00    Types: Cigarettes  . Smokeless tobacco: Never Used  Substance Use Topics  . Alcohol use: No  . Drug use: No     Family Hx: The patient's family history includes Heart attack in his father; Heart disease in his father; Hyperlipidemia in his father and mother; Hypertension in his father and mother.  ROS:   Please see the history of present illness.     All other systems reviewed and are negative.   Labs/Other Tests and Data Reviewed:    Recent Labs: 12/21/2017: TSH 0.533 03/31/2018: ALT 18 04/06/2018: Magnesium 2.2 11/06/2018: Hemoglobin 10.9; Platelets 275 11/26/2018: B Natriuretic Peptide 2,041.0; BUN 18; Creatinine, Ser 1.28; Potassium 4.5; Sodium 130   Recent Lipid Panel Lab Results  Component Value Date/Time   CHOL 80 11/09/2018 03:08 AM   CHOL 211 (H) 04/05/2013 05:20 AM   TRIG 42 11/09/2018 03:08 AM   TRIG 321 (H) 04/05/2013 05:20 AM   HDL 26 (L) 11/09/2018 03:08 AM   HDL 34 (L) 04/05/2013 05:20 AM   CHOLHDL 3.1 11/09/2018 03:08 AM   LDLCALC 46 11/09/2018 03:08 AM   LDLCALC 113 (H) 04/05/2013 05:20 AM    Wt Readings from Last 3 Encounters:  12/10/18 197 lb (89.4 kg)  11/25/18 228 lb (103.4 kg)  11/19/18 237 lb (107.5 kg)     Exam:    Vital Signs:  Wt 197 lb (89.4 kg) Comment:  self-reported  BMI 30.85 kg/m    Well nourished, well developed male in no  acute distress.   ASSESSMENT & PLAN:    1. Chronic heart failure with reduced ejection fraction- - NYHA class II - euvolemic today based on patient's description of symptoms - weighing daily and he was reminded to call for an overnight weight gain of >2 pounds or a weekly weight gain of >5 pounds - weight has declined 31 pounds since he was last here - discussed decreasing his diuretic use if weight loss continues - not adding salt and has been using Mrs. Dash & trying to read food labels.  - has [place] - consider adding entresto at future visits if BP remains ok - saw cardiologist ([facility]) 02/19/18 - BNP 11/26/2018 was 2041.0  2: HTN- - not checking his BP at home - saw PCP Vickki Muff) 11/13/2018 - BMP done 11/26/2018 reviewed and shows sodium 130, potassium 4.5, creatinine 1.28 and GFR >60  3: Diabetes- - glucose at home was 138 this morning - A1c on 11/21/17 was 8.5%  4: Tobacco use- - currently smoking <1/2 ppd of cigarettes - complete cessation discussed for 3 minutes with him   COVID-19 Education: The signs and symptoms of COVID-19 were discussed with the patient and how to seek care for testing (follow up with PCP or arrange E-visit).  The importance of social distancing was discussed today.  Patient Risk:   After full review of this patients clinical status, I feel that they are at least moderate risk at this time.  Time:   Today, I have spent 8 minutes with the patient with telehealth technology discussing medications, weight and symptoms to report.     Medication Adjustments/Labs and Tests Ordered: Current medicines are reviewed at length with the patient today.  Concerns regarding medicines are outlined above.   Tests Ordered: No orders of the defined types were placed in this encounter.  Medication Changes: No orders of the defined types were placed in this  encounter.   Disposition:  Follow-up in 2 months or sooner for any questions/problems before then.   Signed, Alisa Graff, FNP  12/10/2018 11:17 AM    [facility]

## 2018-12-13 NOTE — Progress Notes (Signed)
Virtual Visit via Video Note   This visit type was conducted due to national recommendations for restrictions regarding the COVID-19 Pandemic (e.g. social distancing) in an effort to limit this patient's exposure and mitigate transmission in our community.  Due to his co-morbid illnesses, this patient is at least at moderate risk for complications without adequate follow up.  This format is felt to be most appropriate for this patient at this time.  All issues noted in this document were discussed and addressed.  A limited physical exam was performed with this format.  Please refer to the patient's chart for his consent to telehealth for [facility].   I connected with  [name] [name] on 12/14/18 by a video enabled telemedicine application and verified that I am speaking with the correct person using two identifiers. I discussed the limitations of evaluation and management by telemedicine. The patient expressed understanding and agreed to proceed.   Evaluation Performed:  Follow-up visit  Date:  12/14/2018   ID:  [name] [name], DOB 1960/06/14, MRN [id number]  Patient Location:  [place] [address]   Provider location:   [facility], [place] office  PCP:  Clarisse Gouge, MD  Cardiologist:  Patsy Baltimore   Chief Complaint:  Follow-up following recent hospital discharge, acute on chronic systolic CHF    History of Present Illness:    [name] [name] is a 58 y.o. male who presents via audio/video conferencing for a telehealth visit today.   The patient does not symptoms concerning for COVID-19 infection (fever, chills, cough, or new SHORTNESS OF BREATH).   Patient has a past medical history of 58 y.o. male with history of multivessel CAD s/p PCI  previously considered for CABG at 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 and lower extremity ulceration,  PAD s/p prior SFA stenting 03/2013 s/p R BKA,  carotid arterial disease Who presents for follow-up of his acute on chronic heart failure , hospital discharge  Presentation to the hospital May 2024 acute on chronic systolic CHF Treated with Lasix IV, transition to torsemide Toprol XL and spironolactone   with continued -ACEi has previously been held secondary to relative hypotension  Since his discharge, Weight 214 down 187 On fluid pill, torsemide 40 twice daily "Wants to get to 175" Using potassium powder, and out of the pills, has some occasional leg cramping  SOB better, feels that he is at his baseline Leg blisters, burst, but they do not look infected Wound on left foot healing  Still smoking, 2 ppds, no desire chantix does not work Girlfriend smokes  LE stent placed 08/2018 Percutaneous transluminal angioplasty of the entire left SFA and above-knee popliteal artery with a 5 mm diameter by 30 cm length and a 5 mm diameter by 22 cm length Lutonix drug-coated angioplasty balloon  Prior CV studies:   The following studies were reviewed today:  Cardiac cath: 04/2017 1.  Significant three-vessel coronary artery disease with patent stent in the RCA and LAD.  There is significant proximal RCA disease before the stent as well as diffuse mid and distal disease in a vessel that appears to be about 2.5 mm in diameter.  There is significant bifurcation stenosis in the proximal left circumflex with a large OM1.  The LAD has moderate disease. The coronary arteries are moderately calcified and diffusely diseased throughout. 2.  Left ventricular angiography was not performed.  EF was moderately to severely reduced by echo. 3.  Severely elevated left ventricular end-diastolic pressure at 34 mmHg.   2D Echo 11/06/2018: 1. The left ventricle has severely reduced systolic function,  with an ejection fraction of 20-25%. The cavity size was moderately dilated. Indeterminate diastolic filling due to E-A fusion. Left ventricular diffuse hypokinesis. 2. The right ventricle has severely reduced systolic function. The cavity was mildly enlarged. There is no increase in right ventricular wall thickness. Right ventricular systolic pressure is moderately elevated with an estimated pressure of 59.6 mmHg. 3. Left atrial size was mildly dilated. 4. Right atrial size was mildly dilated. 5. The mitral valve is degenerative. Mild thickening of the mitral valve leaflet. Mild calcification of the mitral valve leaflet. There is mild mitral annular calcification present. 6. The tricuspid valve is degenerative. Tricuspid valve regurgitation is moderate. 7. The aortic valve was not well visualized. Moderate thickening of the aortic valve. Moderate calcification of the aortic valve. 8. The inferior vena cava was dilated in size with <50% respiratory variability.  Past Medical History:  Diagnosis Date  . Arthritis   . Asthma   . Atherosclerosis   . BPH (benign prostatic hyperplasia)   . Carotid arterial disease ([place])   . Charcot's joint of foot, right   . Chronic combined systolic (congestive) and diastolic (congestive) heart failure ([place])    a. 02/2013 EF 50% by LV gram; b. 04/2017 Echo: EF 25-30%. diff HK. Gr2 DD. Mod dil LA/RV. PASP 27mmHg.  [name] COPD (chronic obstructive pulmonary disease) ([place])   . Coronary artery disease    a. 2013 S/P PCI of LAD [facility]);  b. 03/2013 PCI: RCA 90p ( 2.5 x 23 mm DES); c. 02/2014 Cath: patent RCA stent->Med Rx; d. 04/2017 Cath: LM nl, LAd 30ost/p, 57m/d, D1 80ost, LCX 90p/m, OM1 85, RCA 70/20p, 72m, 70d->Referred for CT Surg-felt to be poor candidate.  . Diabetic neuropathy ([place])   . Diabetic ulcer of right foot ([place])   . Hernia   . Hyperlipidemia   . Hypertension   . Ischemic cardiomyopathy    a. 04/2017 Echo: EF 25-30%; b. 08/2017 Cardiac MRI ([facility]): EF  19%, sev glob HK. RVEF 20%, mod BAE, triv MR, mild-mod TR. Basal lateral subendocardial infarct (viable), inf/infsept, dist septal ischemia, basal to mid lat peri-infarct ischemia.  . Morbid obesity ([place])   . Neuropathy   . PAD (peripheral artery disease) ([place])    a. Followed by Dr. Lucky Cowboy; b. 08/2010 Periph Angio: RSFA 70-80p (6X60 self-expanding stent); c. 09/2010 Periph Angio: L SFA 100p (7x unknown length self-expanding stent); d. 06/2015 Periph Angio: R SFA short segment occlusion (6x12 self-expanding stent).  . Restless leg syndrome   . Sleep apnea   . Subclavian artery stenosis, right ([place])   . Syncope and collapse   . Tobacco use    a. 75+ yr hx - still smoking 1ppd, down from 2 ppd.  . Type II diabetes mellitus ([place])   . Varicose vein    Past Surgical History:  Procedure Laterality Date  . ABDOMINAL AORTAGRAM N/A 06/23/2013   Procedure: ABDOMINAL Maxcine Ham;  Surgeon: Wellington Hampshire, MD;  Location: [place] CATH LAB;  Service: Cardiovascular;  Laterality: N/A;  . AMPUTATION Right 04/01/2018   Procedure: AMPUTATION BELOW KNEE;  Surgeon: Algernon Huxley, MD;  Location: [facility] ORS;  Service: Vascular;  Laterality: Right;  .  APPENDECTOMY    . CARDIAC CATHETERIZATION  10/14   [facility]; X1 STENT PROXIMAL RCA  . CARDIAC CATHETERIZATION  02/2010   [facility]  . CARDIAC CATHETERIZATION  02/21/2014   [facility]  . CLAVICLE SURGERY    . HERNIA REPAIR    . IRRIGATION AND DEBRIDEMENT FOOT Right 01/04/2018   Procedure: IRRIGATION AND DEBRIDEMENT FOOT;  Surgeon: Samara Deist, DPM;  Location: [facility] ORS;  Service: Podiatry;  Laterality: Right;  . LEFT HEART CATH AND CORS/GRAFTS ANGIOGRAPHY N/A 04/28/2017   Procedure: LEFT HEART CATH AND CORONARY ANGIOGRAPHY;  Surgeon: Wellington Hampshire, MD;  Location: [place] CV LAB;  Service: Cardiovascular;  Laterality: N/A;  . LOWER EXTREMITY ANGIOGRAPHY Right 03/03/2018   Procedure: LOWER EXTREMITY ANGIOGRAPHY;  Surgeon: Katha Cabal, MD;  Location: [place] CV LAB;   Service: Cardiovascular;  Laterality: Right;  . LOWER EXTREMITY ANGIOGRAPHY Left 08/24/2018   Procedure: LOWER EXTREMITY ANGIOGRAPHY;  Surgeon: Algernon Huxley, MD;  Location: [place] CV LAB;  Service: Cardiovascular;  Laterality: Left;  . PERIPHERAL ARTERIAL STENT GRAFT     x2 left/right  . PERIPHERAL VASCULAR BALLOON ANGIOPLASTY Right 01/06/2018   Procedure: PERIPHERAL VASCULAR BALLOON ANGIOPLASTY;  Surgeon: Katha Cabal, MD;  Location: [place] [facility];  Service: Cardiovascular;  Laterality: Right;  . PERIPHERAL VASCULAR CATHETERIZATION N/A 07/06/2015   Procedure: Abdominal Aortogram w/Lower Extremity;  Surgeon: Algernon Huxley, MD;  Location: [place] [facility];  Service: Cardiovascular;  Laterality: N/A;  . PERIPHERAL VASCULAR CATHETERIZATION  07/06/2015   Procedure: Lower Extremity Intervention;  Surgeon: Algernon Huxley, MD;  Location: [place] CV LAB;  Service: Cardiovascular;;     No outpatient medications have been marked as taking for the 12/14/18 encounter (Appointment) with Minna Merritts, MD.     Allergies:   Dulaglutide, Other, and Prednisone   Social History   Tobacco Use  . Smoking status: Current Every Day Smoker    Packs/day: 1.00    Years: 41.00    Pack years: 41.00    Types: Cigarettes  . Smokeless tobacco: Never Used  Substance Use Topics  . Alcohol use: No  . Drug use: No     Current Outpatient Medications on File Prior to Visit  Medication Sig Dispense Refill  . albuterol (VENTOLIN HFA) 108 (90 Base) MCG/ACT inhaler Inhale into the lungs every 6 (six) hours as needed for wheezing or shortness of breath.    [name] aspirin EC 81 MG tablet Take 1 tablet (81 mg total) by mouth daily. 90 tablet 3  . atorvastatin (LIPITOR) 80 MG tablet Take 80 mg by mouth daily.    . calcium carbonate (TUMS - DOSED IN MG ELEMENTAL CALCIUM) 500 MG chewable tablet Chew 4 tablets by mouth daily as needed for indigestion or heartburn.    . clopidogrel (PLAVIX) 75 MG tablet Take  75 mg by mouth daily.     . diphenhydrAMINE (BENADRYL) 25 MG tablet Take 25 mg by mouth daily as needed for allergies.    [name] ezetimibe (ZETIA) 10 MG tablet Take 10 mg by mouth daily.    [name] gabapentin (NEURONTIN) 800 MG tablet Take 800 mg by mouth 4 (four) times daily.     . Insulin Glargine (LANTUS SOLOSTAR) 100 UNIT/ML Solostar Pen Inject 30 Units into the skin at bedtime. 15 mL 11  . insulin lispro (HUMALOG) 100 UNIT/ML injection Sliding Scale Insulin before meals using Novolog If BS is 0 to 70 no insulin and eat/glucose tablets/glucose get If BS 71  to 150 great! If BS 151 to 200, 2 Units Novolog If BS 201 to 250, 4 Units Novolog If BS 251 to 300, 6 Units Novolog If BS 301 to 350, 8 Units Novolog If BS 351 to 400, 10 Units Novolog If BS greater than 400, take 10 Units and Contact our office during office hour    . ipratropium-albuterol (DUONEB) 0.5-2.5 (3) MG/3ML SOLN Inhale 3 mLs into the lungs every 6 (six) hours as needed (wheezing/sob).     . isosorbide mononitrate (IMDUR) 30 MG 24 hr tablet Take 1 tablet (30 mg total) by mouth daily. 30 tablet 0  . metFORMIN (GLUCOPHAGE-XR) 500 MG 24 hr tablet Take 1,000 mg by mouth 2 (two) times daily.     . metoprolol succinate (TOPROL-XL) 25 MG 24 hr tablet Take 12.5 mg by mouth daily.     . Multiple Vitamin (MULTI-VITAMINS) TABS Take 1 tablet by mouth daily.     . multivitamin-lutein (OCUVITE-LUTEIN) CAPS capsule Take 1 capsule by mouth daily. 30 capsule 0  . nitroGLYCERIN (NITROSTAT) 0.4 MG SL tablet Place 0.4 mg under the tongue every 5 (five) minutes as needed for chest pain.    . Oxycodone HCl 10 MG TABS Take 10 mg by mouth every 6 (six) hours as needed (pain).     . potassium chloride (K-DUR) 10 MEQ tablet Take 2 tablets (20 mEq total) by mouth daily. 180 tablet 3  . promethazine (PHENERGAN) 25 MG tablet TAKE 1 TABLET BY MOUTH EVERY 6 HOURS AS NEEDED NAUSEA    . spironolactone (ALDACTONE) 25 MG tablet Take 25 mg by mouth daily.     [name] torsemide  (DEMADEX) 20 MG tablet Take 2 tablets (40 mg total) by mouth 2 (two) times daily. (Patient taking differently: Take by mouth 2 (two) times daily. Taking 60mg  AM and 40mg  PM) 360 tablet 3  . traZODone (DESYREL) 50 MG tablet Take 50 mg by mouth at bedtime as needed for sleep.      No current facility-administered medications on file prior to visit.      Family Hx: The patient's family history includes Heart attack in his father; Heart disease in his father; Hyperlipidemia in his father and mother; Hypertension in his father and mother.  ROS:   Please see the history of present illness.    Review of Systems  Constitutional: Negative.   HENT: Negative.   Respiratory: Negative.   Cardiovascular: Negative.   Gastrointestinal: Negative.   Musculoskeletal: Negative.   Skin:       Left lower extremity ulceration  Neurological: Negative.   Psychiatric/Behavioral: Negative.   All other systems reviewed and are negative.    Labs/Other Tests and Data Reviewed:    Recent Labs: 12/21/2017: TSH 0.533 03/31/2018: ALT 18 04/06/2018: Magnesium 2.2 11/06/2018: Hemoglobin 10.9; Platelets 275 11/26/2018: B Natriuretic Peptide 2,041.0; BUN 18; Creatinine, Ser 1.28; Potassium 4.5; Sodium 130   Recent Lipid Panel Lab Results  Component Value Date/Time   CHOL 80 11/09/2018 03:08 AM   CHOL 211 (H) 04/05/2013 05:20 AM   TRIG 42 11/09/2018 03:08 AM   TRIG 321 (H) 04/05/2013 05:20 AM   HDL 26 (L) 11/09/2018 03:08 AM   HDL 34 (L) 04/05/2013 05:20 AM   CHOLHDL 3.1 11/09/2018 03:08 AM   LDLCALC 46 11/09/2018 03:08 AM   LDLCALC 113 (H) 04/05/2013 05:20 AM    Wt Readings from Last 3 Encounters:  12/10/18 197 lb (89.4 kg)  11/25/18 228 lb (103.4 kg)  11/19/18 237 lb (107.5 kg)  Exam:    Vital Signs: Vital signs may also be detailed in the HPI There were no vitals taken for this visit.  Wt Readings from Last 3 Encounters:  12/10/18 197 lb (89.4 kg)  11/25/18 228 lb (103.4 kg)  11/19/18 237 lb  (107.5 kg)   Temp Readings from Last 3 Encounters:  11/25/18 98.2 F (36.8 C)  11/09/18 97.9 F (36.6 C) (Oral)  08/24/18 98 F (36.7 C) (Oral)   BP Readings from Last 3 Encounters:  11/25/18 115/78  11/09/18 115/87  10/15/18 119/85   Pulse Readings from Last 3 Encounters:  11/25/18 (!) 109  11/09/18 70  10/15/18 (!) 112    115/70, pulse 70, respirations 16  Well nourished, well developed male in no acute distress. Constitutional:  oriented to person, place, and time. No distress.  Head: Normocephalic and atraumatic.  Eyes:  no discharge. No scleral icterus.  Neck: Normal range of motion. Neck supple.  Pulmonary/Chest: No audible wheezing, no distress, appears comfortable Musculoskeletal: Normal range of motion.  no  tenderness or deformity.  Neurological:   Coordination normal. Full exam not performed Skin:  No rash Psychiatric:  normal mood and affect. behavior is normal. Thought content normal.    ASSESSMENT & PLAN:    Chronic systolic HF (heart failure) (HCC) - Feels euvolemic, Continue torsemide 40 twice daily with potassium We will request lab work through primary care on visit tomorrow Concern for dehydration given weight drop since discharge from the hospital but reports he feels euvolemic -Depending on lab work and blood pressure measurements could restart ARB/Entresto  Coronary artery disease of native artery of native heart with stable angina pectoris ([place]) - Currently with no symptoms of angina. No further workup at this time. Continue current medication regimen. Occasionally uses nitro or nitro patch Previously declined CABG  Type 2 diabetes mellitus with foot ulcer, with long-term current use of insulin ([place]) We have encouraged continued exercise, careful diet management in an effort to lose weight.  Atherosclerosis of artery of extremity with ulceration (HCC) -  Followed by vascular, prior stent left SFA Amputation on the right  Essential  hypertension - Blood pressure is well controlled on today's visit. No changes made to the medications.  PAD (peripheral artery disease) ([place]) -  As above recommended continued surveillance lower extremity disease, strongly recommended smoking cessation Discussed various smoking cessation techniques with him.  He does not want Chantix  Mixed hyperlipidemia -  Cholesterol is at goal on the current lipid regimen. No changes to the medications were made.    COVID-19 Education: The signs and symptoms of COVID-19 were discussed with the patient and how to seek care for testing (follow up with PCP or arrange E-visit).  The importance of social distancing was discussed today.  Patient Risk:   After full review of this patients clinical status, I feel that they are at least moderate risk at this time.  Time:   Today, I have spent 25 minutes with the patient with telehealth technology discussing the cardiac and medical problems/diagnoses detailed above   10 min spent reviewing the chart prior to patient visit today   Medication Adjustments/Labs and Tests Ordered: Current medicines are reviewed at length with the patient today.  Concerns regarding medicines are outlined above.   Tests Ordered: No tests ordered   Medication Changes: No changes made   Disposition: Follow-up in 6 months   Signed, Ida Rogue, MD  12/14/2018 12:20 PM    [facility]  [employer] [address], [place], [place] [address]

## 2018-12-14 ENCOUNTER — Telehealth (INDEPENDENT_AMBULATORY_CARE_PROVIDER_SITE_OTHER): Payer: Medicare Other | Admitting: Cardiovascular Disease

## 2018-12-14 ENCOUNTER — Other Ambulatory Visit: Payer: Self-pay

## 2018-12-14 DIAGNOSIS — I10 Essential (primary) hypertension: Secondary | ICD-10-CM | POA: Diagnosis not present

## 2018-12-14 DIAGNOSIS — I5022 Chronic systolic (congestive) heart failure: Secondary | ICD-10-CM

## 2018-12-14 DIAGNOSIS — I70299 Other atherosclerosis of native arteries of extremities, unspecified extremity: Secondary | ICD-10-CM

## 2018-12-14 DIAGNOSIS — I25118 Atherosclerotic heart disease of native coronary artery with other forms of angina pectoris: Secondary | ICD-10-CM

## 2018-12-14 DIAGNOSIS — E11621 Type 2 diabetes mellitus with foot ulcer: Secondary | ICD-10-CM

## 2018-12-14 DIAGNOSIS — E782 Mixed hyperlipidemia: Secondary | ICD-10-CM

## 2018-12-14 DIAGNOSIS — Z794 Long term (current) use of insulin: Secondary | ICD-10-CM

## 2018-12-14 DIAGNOSIS — L97509 Non-pressure chronic ulcer of other part of unspecified foot with unspecified severity: Secondary | ICD-10-CM

## 2018-12-14 DIAGNOSIS — L97909 Non-pressure chronic ulcer of unspecified part of unspecified lower leg with unspecified severity: Secondary | ICD-10-CM

## 2018-12-14 DIAGNOSIS — I739 Peripheral vascular disease, unspecified: Secondary | ICD-10-CM

## 2018-12-14 NOTE — Patient Instructions (Signed)

## 2018-12-21 ENCOUNTER — Telehealth (INDEPENDENT_AMBULATORY_CARE_PROVIDER_SITE_OTHER): Payer: Self-pay | Admitting: Vascular Surgery

## 2018-12-21 NOTE — Telephone Encounter (Signed)
Patient was last seen on 10/15/2018. See below

## 2018-12-21 NOTE — Telephone Encounter (Signed)
Have him come in for unna wraps and we will get him in for a provider visit next week or so

## 2018-12-22 ENCOUNTER — Ambulatory Visit (INDEPENDENT_AMBULATORY_CARE_PROVIDER_SITE_OTHER): Payer: Medicare Other | Admitting: Nurse Practitioner

## 2018-12-22 ENCOUNTER — Other Ambulatory Visit: Payer: Self-pay

## 2018-12-22 VITALS — BP 116/77 | HR 118 | Resp 17 | Ht 67.0 in | Wt 205.0 lb

## 2018-12-22 DIAGNOSIS — I83029 Varicose veins of left lower extremity with ulcer of unspecified site: Secondary | ICD-10-CM

## 2018-12-22 DIAGNOSIS — L97929 Non-pressure chronic ulcer of unspecified part of left lower leg with unspecified severity: Secondary | ICD-10-CM | POA: Diagnosis not present

## 2018-12-22 NOTE — Progress Notes (Signed)
History of Present Illness  There is no documented history at this time  Assessments & Plan   There are no diagnoses linked to this encounter.    Additional instructions  Subjective:  Patient presents with venous ulcer of the Left lower extremity.    Procedure:  3 layer unna wrap was placed Left lower extremity.   Plan:   Follow up in one week.  

## 2018-12-24 ENCOUNTER — Other Ambulatory Visit (INDEPENDENT_AMBULATORY_CARE_PROVIDER_SITE_OTHER): Payer: Self-pay | Admitting: Nurse Practitioner

## 2018-12-24 DIAGNOSIS — L97929 Non-pressure chronic ulcer of unspecified part of left lower leg with unspecified severity: Secondary | ICD-10-CM

## 2018-12-24 DIAGNOSIS — I83029 Varicose veins of left lower extremity with ulcer of unspecified site: Secondary | ICD-10-CM

## 2018-12-24 DIAGNOSIS — I739 Peripheral vascular disease, unspecified: Secondary | ICD-10-CM

## 2018-12-24 DIAGNOSIS — Z95820 Peripheral vascular angioplasty status with implants and grafts: Secondary | ICD-10-CM

## 2018-12-25 ENCOUNTER — Other Ambulatory Visit: Payer: Self-pay

## 2018-12-25 ENCOUNTER — Encounter (INDEPENDENT_AMBULATORY_CARE_PROVIDER_SITE_OTHER): Payer: Self-pay | Admitting: Nurse Practitioner

## 2018-12-25 ENCOUNTER — Ambulatory Visit (INDEPENDENT_AMBULATORY_CARE_PROVIDER_SITE_OTHER): Payer: Medicare Other

## 2018-12-25 ENCOUNTER — Ambulatory Visit (INDEPENDENT_AMBULATORY_CARE_PROVIDER_SITE_OTHER): Payer: Medicare Other | Admitting: Nurse Practitioner

## 2018-12-25 VITALS — BP 108/67 | HR 103 | Resp 16 | Ht 67.0 in | Wt 210.0 lb

## 2018-12-25 DIAGNOSIS — J449 Chronic obstructive pulmonary disease, unspecified: Secondary | ICD-10-CM

## 2018-12-25 DIAGNOSIS — Z79899 Other long term (current) drug therapy: Secondary | ICD-10-CM

## 2018-12-25 DIAGNOSIS — E782 Mixed hyperlipidemia: Secondary | ICD-10-CM

## 2018-12-25 DIAGNOSIS — I739 Peripheral vascular disease, unspecified: Secondary | ICD-10-CM | POA: Diagnosis not present

## 2018-12-25 DIAGNOSIS — I83029 Varicose veins of left lower extremity with ulcer of unspecified site: Secondary | ICD-10-CM | POA: Diagnosis not present

## 2018-12-25 DIAGNOSIS — L97929 Non-pressure chronic ulcer of unspecified part of left lower leg with unspecified severity: Secondary | ICD-10-CM

## 2018-12-25 DIAGNOSIS — Z720 Tobacco use: Secondary | ICD-10-CM

## 2018-12-25 DIAGNOSIS — Z95820 Peripheral vascular angioplasty status with implants and grafts: Secondary | ICD-10-CM

## 2018-12-25 NOTE — Progress Notes (Signed)
SUBJECTIVE:  Patient ID: [name] [name], male    DOB: 1960/08/25, 58 y.o.   MRN: [id number] Chief Complaint  Patient presents with  . Follow-up    ultrasound    HPI  [name] [name] is a 58 y.o. male that presents today for lower extremity evaluation.  The patient was recently placed in a wraps due to swelling, weeping and lower extremity ulceration.  The patient still currently has 3 small ulcerations on the anterior portion of his left lower extremity.  The patient has had a previous right below-knee amputation.  It was noted that the patient had a area of dark discoloration on the sole of his left foot.  He denies any pain or any injuries to that area.  Patient most recently underwent angiogram of the left lower extremity on 08/24/2018.  He denies any claudication or rest pain like symptoms.  He denies any fever, chills, nausea, vomiting or diarrhea.  Today he underwent ABIs which revealed 1.02 of his left lower extremity with a TBI of 0.92.  ABIs previously done on 10/15/2018 had an ABI of 0.93.  He has biphasic anterior tibial artery waveforms with triphasic posterior tibial artery waveforms.  It is also noted that although the ABI was within normal limits the arterial Doppler waveforms at the ankles suggest some component of arterial occlusive disease.  Past Medical History:  Diagnosis Date  . Arthritis   . Asthma   . Atherosclerosis   . BPH (benign prostatic hyperplasia)   . Carotid arterial disease ([place])   . Charcot's joint of foot, right   . Chronic combined systolic (congestive) and diastolic (congestive) heart failure ([place])    a. 02/2013 EF 50% by LV gram; b. 04/2017 Echo: EF 25-30%. diff HK. Gr2 DD. Mod dil LA/RV. PASP 74mmHg.  [name] COPD (chronic obstructive pulmonary disease) ([place])   . Coronary artery disease    a. 2013 S/P PCI of LAD [facility]);  b. 03/2013 PCI: RCA 90p ( 2.5 x 23 mm DES); c. 02/2014 Cath: patent RCA stent->Med Rx; d. 04/2017 Cath: LM nl, LAd 30ost/p, 34m/d, D1 80ost, LCX  90p/m, OM1 85, RCA 70/20p, 35m, 70d->Referred for CT Surg-felt to be poor candidate.  . Diabetic neuropathy ([place])   . Diabetic ulcer of right foot ([place])   . Hernia   . Hyperlipidemia   . Hypertension   . Ischemic cardiomyopathy    a. 04/2017 Echo: EF 25-30%; b. 08/2017 Cardiac MRI ([facility]): EF 19%, sev glob HK. RVEF 20%, mod BAE, triv MR, mild-mod TR. Basal lateral subendocardial infarct (viable), inf/infsept, dist septal ischemia, basal to mid lat peri-infarct ischemia.  . Morbid obesity ([place])   . Neuropathy   . PAD (peripheral artery disease) ([place])    a. Followed by Dr. Lucky Cowboy; b. 08/2010 Periph Angio: RSFA 70-80p (6X60 self-expanding stent); c. 09/2010 Periph Angio: L SFA 100p (7x unknown length self-expanding stent); d. 06/2015 Periph Angio: R SFA short segment occlusion (6x12 self-expanding stent).  . Restless leg syndrome   . Sleep apnea   . Subclavian artery stenosis, right ([place])   . Syncope and collapse   . Tobacco use    a. 75+ yr hx - still smoking 1ppd, down from 2 ppd.  . Type II diabetes mellitus ([place])   . Varicose vein     Past Surgical History:  Procedure Laterality Date  . ABDOMINAL AORTAGRAM N/A 06/23/2013   Procedure: ABDOMINAL Maxcine Ham;  Surgeon: Wellington Hampshire, MD;  Location: [place] CATH LAB;  Service: Cardiovascular;  Laterality: N/A;  . AMPUTATION Right 04/01/2018   Procedure: AMPUTATION BELOW KNEE;  Surgeon: Algernon Huxley, MD;  Location: [facility] ORS;  Service: Vascular;  Laterality: Right;  . APPENDECTOMY    . CARDIAC CATHETERIZATION  10/14   [facility]; X1 STENT PROXIMAL RCA  . CARDIAC CATHETERIZATION  02/2010   [facility]  . CARDIAC CATHETERIZATION  02/21/2014   [facility]  . CLAVICLE SURGERY    . HERNIA REPAIR    . IRRIGATION AND DEBRIDEMENT FOOT Right 01/04/2018   Procedure: IRRIGATION AND DEBRIDEMENT FOOT;  Surgeon: Samara Deist, DPM;  Location: [facility] ORS;  Service: Podiatry;  Laterality: Right;  . LEFT HEART CATH AND CORS/GRAFTS ANGIOGRAPHY N/A 04/28/2017   Procedure: LEFT HEART  CATH AND CORONARY ANGIOGRAPHY;  Surgeon: Wellington Hampshire, MD;  Location: [place] CV LAB;  Service: Cardiovascular;  Laterality: N/A;  . LOWER EXTREMITY ANGIOGRAPHY Right 03/03/2018   Procedure: LOWER EXTREMITY ANGIOGRAPHY;  Surgeon: Katha Cabal, MD;  Location: [place] CV LAB;  Service: Cardiovascular;  Laterality: Right;  . LOWER EXTREMITY ANGIOGRAPHY Left 08/24/2018   Procedure: LOWER EXTREMITY ANGIOGRAPHY;  Surgeon: Algernon Huxley, MD;  Location: [place] CV LAB;  Service: Cardiovascular;  Laterality: Left;  . PERIPHERAL ARTERIAL STENT GRAFT     x2 left/right  . PERIPHERAL VASCULAR BALLOON ANGIOPLASTY Right 01/06/2018   Procedure: PERIPHERAL VASCULAR BALLOON ANGIOPLASTY;  Surgeon: Katha Cabal, MD;  Location: [place] CV LAB;  Service: Cardiovascular;  Laterality: Right;  . PERIPHERAL VASCULAR CATHETERIZATION N/A 07/06/2015   Procedure: Abdominal Aortogram w/Lower Extremity;  Surgeon: Algernon Huxley, MD;  Location: [place] CV LAB;  Service: Cardiovascular;  Laterality: N/A;  . PERIPHERAL VASCULAR CATHETERIZATION  07/06/2015   Procedure: Lower Extremity Intervention;  Surgeon: Algernon Huxley, MD;  Location: [place] CV LAB;  Service: Cardiovascular;;    Social History   Socioeconomic History  . Marital status: Single    Spouse name: Not on file  . Number of children: 0  . Years of education: 28  . Highest education level: 12th grade  Occupational History  . Occupation: on disability  Social Needs  . Financial resource strain: Not hard at all  . Food insecurity    Worry: Never true    Inability: Never true  . Transportation needs    Medical: No    Non-medical: No  Tobacco Use  . Smoking status: Current Every Day Smoker    Packs/day: 1.00    Years: 41.00    Pack years: 41.00    Types: Cigarettes  . Smokeless tobacco: Never Used  Substance and Sexual Activity  . Alcohol use: No  . Drug use: No  . Sexual activity: Yes  Lifestyle  . Physical  activity    Days per week: 7 days    Minutes per session: 30 min  . Stress: Rather much  Relationships  . Social connections    Talks on phone: More than three times a week    Gets together: Once a week    Attends religious service: Never    Active member of club or organization: No    Attends meetings of clubs or organizations: Never    Relationship status: Living with partner  . Intimate partner violence    Fear of current or ex partner: No    Emotionally abused: No    Physically abused: No    Forced sexual activity: No  Other Topics Concern  . Not on file  Social History Narrative   Lives  at home in [place] with girlfriend. Independent at baseline.    Family History  Problem Relation Age of Onset  . Heart attack Father   . Heart disease Father   . Hypertension Father   . Hyperlipidemia Father   . Hypertension Mother   . Hyperlipidemia Mother     Allergies  Allergen Reactions  . Dulaglutide Anaphylaxis, Diarrhea and Hives  . Other Itching    Skin itching associated with nitro patch  . Prednisone Rash     Review of Systems   Review of Systems: Negative Unless Checked Constitutional: [] Weight loss  [] Fever  [] Chills Cardiac: [] Chest pain   []  Atrial Fibrillation  [] Palpitations   [] Shortness of breath when laying flat   [] Shortness of breath with exertion. [] Shortness of breath at rest Vascular:  [] Pain in legs with walking   [] Pain in legs with standing [] Pain in legs when laying flat   [] Claudication    [] Pain in feet when laying flat    [] History of DVT   [] Phlebitis   [x] Swelling in legs   [x] Varicose veins   [x] Non-healing ulcers Pulmonary:   [] Uses home oxygen   [] Productive cough   [] Hemoptysis   [] Wheeze  [x] COPD   [] Asthma Neurologic:  [] Dizziness   [] Seizures  [] Blackouts [] History of stroke   [] History of TIA  [] Aphasia   [] Temporary Blindness   [] Weakness or numbness in arm   [x] Weakness or numbness in leg Musculoskeletal:   [] Joint swelling   [] Joint  pain   [] Low back pain  []  History of Knee Replacement [x] Arthritis [] back Surgeries  []  Spinal Stenosis    Hematologic:  [] Easy bruising  [] Easy bleeding   [] Hypercoagulable state   [] Anemic Gastrointestinal:  [] Diarrhea   [] Vomiting  [] Gastroesophageal reflux/heartburn   [] Difficulty swallowing. [] Abdominal pain Genitourinary:  [] Chronic kidney disease   [] Difficult urination  [] Anuric   [] Blood in urine [] Frequent urination  [] Burning with urination   [] Hematuria Skin:  [] Rashes   [x] Ulcers [x] Wounds Psychological:  [] History of anxiety   []  History of major depression  []  Memory Difficulties      OBJECTIVE:   Physical Exam  BP 108/67 (BP Location: Right Arm)   Pulse (!) 103   Resp 16   Ht 5\' 7"  (1.702 m)   Wt 210 lb (95.3 kg)   BMI 32.89 kg/m   Gen: WD/WN, NAD Head: [place]/AT, No temporalis wasting.  Ear/Nose/Throat: Hearing grossly intact, nares w/o erythema or drainage Eyes: PER, EOMI, sclera nonicteric.  Neck: Supple, no masses.  No JVD.  Pulmonary:  Good air movement, no use of accessory muscles.  Cardiac: RRR Vascular:  Vessel Right Left  Radial Palpable Palpable  Dorsalis Pedis  Palpable  Posterior Tibial  Palpable   Gastrointestinal: soft, non-distended. No guarding/no peritoneal signs.  Musculoskeletal: M/S 5/5 throughout.    Right below-knee amputation Neurologic: Pain and light touch intact in extremities.  Symmetrical.  Speech is fluent. Motor exam as listed above. Psychiatric: Judgment intact, Mood & affect appropriate for pt's clinical situation. Dermatologic:  Left leg stasis dermatitis.  Venous ulceration.  Small non-open darkened areas on sole of foot and on tips of second and third toe No changes consistent with cellulitis. Lymph : No Cervical lymphadenopathy, no lichenification or skin changes of chronic lymphedema.       ASSESSMENT AND PLAN:  1. Venous ulcer of left leg (HCC) Today he underwent ABIs which revealed this was discussed with the patient and  he is in agreement with conservative measures 1.02 of  his left lower extremity with a TBI of 0.92.  ABIs previously done on 10/15/2018 had an ABI of 0.93.  He has biphasic anterior tibial artery waveforms with triphasic posterior tibial artery waveforms.  Toe waveforms are dampened.  It is also noted that although the ABI was within normal limits the arterial Doppler waveforms at the ankles suggest some component of arterial occlusive disease.   Patient has 3 small ulcerations on left lower extremity.  We will utilize [place] wraps to help these heal.  He will remain in [place] wraps for the next 4 weeks and we will bring him back to evaluate the status of his venous ulcerations.  We will continue to watch the ulcerations on the underside of his foot as well as on the tips of his toes.  Noninvasive studies indicate that he should have adequate blood flow to heal these wounds however if they continue to either progress or not heal we may need to consider further intervention.  This was discussed with the patient he is in agreement with conservative measures at this time.  2. Tobacco use Smoking cessation was discussed, 3-10 minutes spent on this topic specifically   3. Mixed hyperlipidemia Continue statin as ordered and reviewed, no changes at this time   4. Chronic obstructive pulmonary disease, unspecified COPD type ([place]) Continue pulmonary medications and aerosols as already ordered, these medications have been reviewed and there are no changes at this time.     Current Outpatient Medications on File Prior to Visit  Medication Sig Dispense Refill  . albuterol (VENTOLIN HFA) 108 (90 Base) MCG/ACT inhaler Inhale into the lungs every 6 (six) hours as needed for wheezing or shortness of breath.    [name] aspirin EC 81 MG tablet Take 1 tablet (81 mg total) by mouth daily. 90 tablet 3  . atorvastatin (LIPITOR) 80 MG tablet Take 80 mg by mouth daily.    . calcium carbonate (TUMS - DOSED IN MG ELEMENTAL  CALCIUM) 500 MG chewable tablet Chew 4 tablets by mouth daily as needed for indigestion or heartburn.    . clopidogrel (PLAVIX) 75 MG tablet Take 75 mg by mouth daily.     . diphenhydrAMINE (BENADRYL) 25 MG tablet Take 25 mg by mouth daily as needed for allergies.    [name] ezetimibe (ZETIA) 10 MG tablet Take 10 mg by mouth daily.    . Insulin Glargine (LANTUS SOLOSTAR) 100 UNIT/ML Solostar Pen Inject 30 Units into the skin at bedtime. 15 mL 11  . insulin lispro (HUMALOG) 100 UNIT/ML injection Sliding Scale Insulin before meals using Novolog If BS is 0 to 70 no insulin and eat/glucose tablets/glucose get If BS 71 to 150 great! If BS 151 to 200, 2 Units Novolog If BS 201 to 250, 4 Units Novolog If BS 251 to 300, 6 Units Novolog If BS 301 to 350, 8 Units Novolog If BS 351 to 400, 10 Units Novolog If BS greater than 400, take 10 Units and Contact our office during office hour    . ipratropium-albuterol (DUONEB) 0.5-2.5 (3) MG/3ML SOLN Inhale 3 mLs into the lungs every 6 (six) hours as needed (wheezing/sob).     . isosorbide mononitrate (IMDUR) 30 MG 24 hr tablet Take 1 tablet (30 mg total) by mouth daily. 30 tablet 0  . metFORMIN (GLUCOPHAGE-XR) 500 MG 24 hr tablet Take 1,000 mg by mouth 2 (two) times daily.     . metoprolol succinate (TOPROL-XL) 25 MG 24 hr tablet Take 12.5 mg by mouth  daily.     . Multiple Vitamin (MULTI-VITAMINS) TABS Take 1 tablet by mouth daily.     . multivitamin-lutein (OCUVITE-LUTEIN) CAPS capsule Take 1 capsule by mouth daily. 30 capsule 0  . nitroGLYCERIN (NITROSTAT) 0.4 MG SL tablet Place 0.4 mg under the tongue every 5 (five) minutes as needed for chest pain.    . Oxycodone HCl 10 MG TABS Take 10 mg by mouth every 6 (six) hours as needed (pain).     . potassium chloride (K-DUR) 10 MEQ tablet Take 2 tablets (20 mEq total) by mouth daily. 180 tablet 3  . promethazine (PHENERGAN) 25 MG tablet TAKE 1 TABLET BY MOUTH EVERY 6 HOURS AS NEEDED NAUSEA    . spironolactone (ALDACTONE) 25 MG  tablet Take 25 mg by mouth daily.     [name] torsemide (DEMADEX) 20 MG tablet Take 2 tablets (40 mg total) by mouth 2 (two) times daily. (Patient taking differently: Take by mouth 2 (two) times daily. Taking 60mg  AM and 40mg  PM) 360 tablet 3  . traZODone (DESYREL) 50 MG tablet Take 50 mg by mouth at bedtime as needed for sleep.     [name] gabapentin (NEURONTIN) 800 MG tablet Take 800 mg by mouth 4 (four) times daily.      No current facility-administered medications on file prior to visit.     There are no Patient Instructions on file for this visit. No follow-ups on file.   Kris Hartmann, NP  This note was completed with Sales executive.  Any errors are purely unintentional.

## 2018-12-29 ENCOUNTER — Encounter (INDEPENDENT_AMBULATORY_CARE_PROVIDER_SITE_OTHER): Payer: Medicare Other

## 2018-12-31 ENCOUNTER — Encounter (INDEPENDENT_AMBULATORY_CARE_PROVIDER_SITE_OTHER): Payer: Medicare Other

## 2019-01-01 ENCOUNTER — Ambulatory Visit (INDEPENDENT_AMBULATORY_CARE_PROVIDER_SITE_OTHER): Payer: Medicare Other | Admitting: Vascular Surgery

## 2019-01-01 ENCOUNTER — Other Ambulatory Visit: Payer: Self-pay

## 2019-01-01 VITALS — BP 114/75 | HR 111 | Resp 12 | Ht 67.0 in | Wt 209.0 lb

## 2019-01-01 DIAGNOSIS — L97929 Non-pressure chronic ulcer of unspecified part of left lower leg with unspecified severity: Secondary | ICD-10-CM

## 2019-01-01 DIAGNOSIS — I83029 Varicose veins of left lower extremity with ulcer of unspecified site: Secondary | ICD-10-CM

## 2019-01-01 NOTE — Progress Notes (Signed)
History of Present Illness  There is no documented history at this time  Assessments & Plan   There are no diagnoses linked to this encounter.    Additional instructions  Subjective:  Patient presents with venous ulcer of the Left lower extremity.    Procedure:  3 layer unna wrap was placed Left lower extremity.   Plan:   Follow up in one week.  

## 2019-01-05 ENCOUNTER — Ambulatory Visit (INDEPENDENT_AMBULATORY_CARE_PROVIDER_SITE_OTHER): Payer: Medicare Other | Admitting: Nurse Practitioner

## 2019-01-05 ENCOUNTER — Encounter (INDEPENDENT_AMBULATORY_CARE_PROVIDER_SITE_OTHER): Payer: Self-pay | Admitting: Nurse Practitioner

## 2019-01-05 ENCOUNTER — Other Ambulatory Visit: Payer: Self-pay

## 2019-01-05 VITALS — BP 117/80 | HR 109 | Resp 12 | Ht 67.0 in | Wt 213.0 lb

## 2019-01-05 DIAGNOSIS — I83029 Varicose veins of left lower extremity with ulcer of unspecified site: Secondary | ICD-10-CM

## 2019-01-05 DIAGNOSIS — L97929 Non-pressure chronic ulcer of unspecified part of left lower leg with unspecified severity: Secondary | ICD-10-CM

## 2019-01-05 NOTE — Progress Notes (Signed)
History of Present Illness  There is no documented history at this time  Assessments & Plan   There are no diagnoses linked to this encounter.    Additional instructions  Subjective:  Patient presents with venous ulcer of the Left lower extremity.    Procedure:  3 layer unna wrap was placed Left lower extremity.   Plan:   Follow up in one week.  

## 2019-01-12 ENCOUNTER — Ambulatory Visit (INDEPENDENT_AMBULATORY_CARE_PROVIDER_SITE_OTHER): Payer: Medicare Other

## 2019-01-12 ENCOUNTER — Ambulatory Visit (INDEPENDENT_AMBULATORY_CARE_PROVIDER_SITE_OTHER): Payer: Medicare Other | Admitting: Nurse Practitioner

## 2019-01-12 ENCOUNTER — Other Ambulatory Visit: Payer: Self-pay

## 2019-01-12 ENCOUNTER — Encounter (INDEPENDENT_AMBULATORY_CARE_PROVIDER_SITE_OTHER): Payer: Self-pay

## 2019-01-12 VITALS — BP 116/83 | HR 93 | Resp 16

## 2019-01-12 DIAGNOSIS — I83029 Varicose veins of left lower extremity with ulcer of unspecified site: Secondary | ICD-10-CM | POA: Diagnosis not present

## 2019-01-12 DIAGNOSIS — L97929 Non-pressure chronic ulcer of unspecified part of left lower leg with unspecified severity: Secondary | ICD-10-CM | POA: Diagnosis not present

## 2019-01-12 NOTE — Progress Notes (Signed)
History of Present Illness  There is no documented history at this time  Assessments & Plan   There are no diagnoses linked to this encounter.    Additional instructions  Subjective:  Patient presents with venous ulcer of the Left lower extremity.    Procedure:  3 layer unna wrap was placed Left lower extremity.   Plan:   Follow up in one week.  

## 2019-01-19 ENCOUNTER — Encounter (INDEPENDENT_AMBULATORY_CARE_PROVIDER_SITE_OTHER): Payer: Self-pay

## 2019-01-19 ENCOUNTER — Ambulatory Visit (INDEPENDENT_AMBULATORY_CARE_PROVIDER_SITE_OTHER): Payer: Medicare Other | Admitting: Vascular Surgery

## 2019-01-19 ENCOUNTER — Encounter (INDEPENDENT_AMBULATORY_CARE_PROVIDER_SITE_OTHER): Payer: Self-pay | Admitting: Vascular Surgery

## 2019-01-19 ENCOUNTER — Other Ambulatory Visit: Payer: Self-pay

## 2019-01-19 VITALS — BP 114/73 | HR 85 | Resp 16 | Ht 67.0 in | Wt 217.0 lb

## 2019-01-19 DIAGNOSIS — L97929 Non-pressure chronic ulcer of unspecified part of left lower leg with unspecified severity: Secondary | ICD-10-CM

## 2019-01-19 DIAGNOSIS — I10 Essential (primary) hypertension: Secondary | ICD-10-CM | POA: Diagnosis not present

## 2019-01-19 DIAGNOSIS — R609 Edema, unspecified: Secondary | ICD-10-CM | POA: Diagnosis not present

## 2019-01-19 DIAGNOSIS — Z794 Long term (current) use of insulin: Secondary | ICD-10-CM

## 2019-01-19 DIAGNOSIS — I739 Peripheral vascular disease, unspecified: Secondary | ICD-10-CM

## 2019-01-19 DIAGNOSIS — E118 Type 2 diabetes mellitus with unspecified complications: Secondary | ICD-10-CM

## 2019-01-19 DIAGNOSIS — I83029 Varicose veins of left lower extremity with ulcer of unspecified site: Secondary | ICD-10-CM

## 2019-01-19 NOTE — Assessment & Plan Note (Signed)
Has had a BKA on the right.  No new left leg symptoms.

## 2019-01-19 NOTE — Progress Notes (Signed)
MRN : [id number]  [name] [name] is a 58 y.o. (11/12/1960) male who presents with chief complaint of  Chief Complaint  Patient presents with  . Follow-up    unna boot follow up  .  History of Present Illness: Patient returns today in follow up of left lower extremity swelling and ulceration.  He has been in [name] now for several weeks and that has healed the wounds on the leg.  The swelling is markedly improved and his leg really does not have any appreciable swelling today.  He feels much better.  He is looking forward to getting out of the [name].  Current Outpatient Medications  Medication Sig Dispense Refill  . albuterol (VENTOLIN HFA) 108 (90 Base) MCG/ACT inhaler Inhale into the lungs every 6 (six) hours as needed for wheezing or shortness of breath.    [name] aspirin EC 81 MG tablet Take 1 tablet (81 mg total) by mouth daily. 90 tablet 3  . atorvastatin (LIPITOR) 80 MG tablet Take 80 mg by mouth daily.    . calcium carbonate (TUMS - DOSED IN MG ELEMENTAL CALCIUM) 500 MG chewable tablet Chew 4 tablets by mouth daily as needed for indigestion or heartburn.    . clopidogrel (PLAVIX) 75 MG tablet Take 75 mg by mouth daily.     . diphenhydrAMINE (BENADRYL) 25 MG tablet Take 25 mg by mouth daily as needed for allergies.    [name] ezetimibe (ZETIA) 10 MG tablet Take 10 mg by mouth daily.    . Insulin Glargine (LANTUS SOLOSTAR) 100 UNIT/ML Solostar Pen Inject 30 Units into the skin at bedtime. 15 mL 11  . insulin lispro (HUMALOG) 100 UNIT/ML injection Sliding Scale Insulin before meals using Novolog If BS is 0 to 70 no insulin and eat/glucose tablets/glucose get If BS 71 to 150 great! If BS 151 to 200, 2 Units Novolog If BS 201 to 250, 4 Units Novolog If BS 251 to 300, 6 Units Novolog If BS 301 to 350, 8 Units Novolog If BS 351 to 400, 10 Units Novolog If BS greater than 400, take 10 Units and Contact our office during office hour    . ipratropium-albuterol (DUONEB) 0.5-2.5 (3) MG/3ML SOLN  Inhale 3 mLs into the lungs every 6 (six) hours as needed (wheezing/sob).     . isosorbide mononitrate (IMDUR) 30 MG 24 hr tablet Take 1 tablet (30 mg total) by mouth daily. 30 tablet 0  . metFORMIN (GLUCOPHAGE-XR) 500 MG 24 hr tablet Take 1,000 mg by mouth 2 (two) times daily.     . metoprolol succinate (TOPROL-XL) 25 MG 24 hr tablet Take 12.5 mg by mouth daily.     . Multiple Vitamin (MULTI-VITAMINS) TABS Take 1 tablet by mouth daily.     . multivitamin-lutein (OCUVITE-LUTEIN) CAPS capsule Take 1 capsule by mouth daily. 30 capsule 0  . nitroGLYCERIN (NITROSTAT) 0.4 MG SL tablet Place 0.4 mg under the tongue every 5 (five) minutes as needed for chest pain.    . Oxycodone HCl 10 MG TABS Take 10 mg by mouth every 6 (six) hours as needed (pain).     . potassium chloride (K-DUR) 10 MEQ tablet Take 2 tablets (20 mEq total) by mouth daily. 180 tablet 3  . promethazine (PHENERGAN) 25 MG tablet TAKE 1 TABLET BY MOUTH EVERY 6 HOURS AS NEEDED NAUSEA    . spironolactone (ALDACTONE) 25 MG tablet Take 25 mg by mouth daily.     [name] torsemide (DEMADEX) 20 MG tablet  Take 2 tablets (40 mg total) by mouth 2 (two) times daily. (Patient taking differently: Take by mouth 2 (two) times daily. Taking 60mg  AM and 40mg  PM) 360 tablet 3  . traZODone (DESYREL) 50 MG tablet Take 50 mg by mouth at bedtime as needed for sleep.     [name] gabapentin (NEURONTIN) 800 MG tablet Take 800 mg by mouth 4 (four) times daily.      No current facility-administered medications for this visit.     Past Medical History:  Diagnosis Date  . Arthritis   . Asthma   . Atherosclerosis   . BPH (benign prostatic hyperplasia)   . Carotid arterial disease ([place])   . Charcot's joint of foot, right   . Chronic combined systolic (congestive) and diastolic (congestive) heart failure ([place])    a. 02/2013 EF 50% by LV gram; b. 04/2017 Echo: EF 25-30%. diff HK. Gr2 DD. Mod dil LA/RV. PASP 62mmHg.  [name] COPD (chronic obstructive pulmonary disease) ([place])   .  Coronary artery disease    a. 2013 S/P PCI of LAD [facility]);  b. 03/2013 PCI: RCA 90p ( 2.5 x 23 mm DES); c. 02/2014 Cath: patent RCA stent->Med Rx; d. 04/2017 Cath: LM nl, LAd 30ost/p, 44m/d, D1 80ost, LCX 90p/m, OM1 85, RCA 70/20p, 47m, 70d->Referred for CT Surg-felt to be poor candidate.  . Diabetic neuropathy ([place])   . Diabetic ulcer of right foot ([place])   . Hernia   . Hyperlipidemia   . Hypertension   . Ischemic cardiomyopathy    a. 04/2017 Echo: EF 25-30%; b. 08/2017 Cardiac MRI ([facility]): EF 19%, sev glob HK. RVEF 20%, mod BAE, triv MR, mild-mod TR. Basal lateral subendocardial infarct (viable), inf/infsept, dist septal ischemia, basal to mid lat peri-infarct ischemia.  . Morbid obesity ([place])   . Neuropathy   . PAD (peripheral artery disease) ([place])    a. Followed by Dr. Lucky Cowboy; b. 08/2010 Periph Angio: RSFA 70-80p (6X60 self-expanding stent); c. 09/2010 Periph Angio: L SFA 100p (7x unknown length self-expanding stent); d. 06/2015 Periph Angio: R SFA short segment occlusion (6x12 self-expanding stent).  . Restless leg syndrome   . Sleep apnea   . Subclavian artery stenosis, right ([place])   . Syncope and collapse   . Tobacco use    a. 75+ yr hx - still smoking 1ppd, down from 2 ppd.  . Type II diabetes mellitus ([place])   . Varicose vein     Past Surgical History:  Procedure Laterality Date  . ABDOMINAL AORTAGRAM N/A 06/23/2013   Procedure: ABDOMINAL Maxcine Ham;  Surgeon: Wellington Hampshire, MD;  Location: [place] CATH LAB;  Service: Cardiovascular;  Laterality: N/A;  . AMPUTATION Right 04/01/2018   Procedure: AMPUTATION BELOW KNEE;  Surgeon: Algernon Huxley, MD;  Location: [facility] ORS;  Service: Vascular;  Laterality: Right;  . APPENDECTOMY    . CARDIAC CATHETERIZATION  10/14   [facility]; X1 STENT PROXIMAL RCA  . CARDIAC CATHETERIZATION  02/2010   [facility]  . CARDIAC CATHE[facility]IZATION  [facility]/2014   [facility]  . CLAVICLE SURGERY    . HERNIA REPAIR    . IRRIGATION AND DEBRIDEMENT FOOT Right 01/04/2018   Procedure: IRRIGATION  AND DEBRIDEMENT FOOT;  Surgeon: Samara Deist, DPM;  Location: [facility] ORS;  Service: Podiatry;  Laterality: Right;  . LEFT HEART CATH AND CORS/GRAFTS ANGIOGRAPHY N/A 04/28/2017   Procedure: LEFT HEART CATH AND CORONARY ANGIOGRAPHY;  Surgeon: Wellington Hampshire, MD;  Location: [place] CV LAB;  Service: Cardiovascular;  Laterality: N/A;  . LOWER  EXTREMITY ANGIOGRAPHY Right 03/03/2018   Procedure: LOWER EXTREMITY ANGIOGRAPHY;  Surgeon: Katha Cabal, MD;  Location: [place] CV LAB;  Service: Cardiovascular;  Laterality: Right;  . LOWER EXTREMITY ANGIOGRAPHY Left 08/24/2018   Procedure: LOWER EXTREMITY ANGIOGRAPHY;  Surgeon: Algernon Huxley, MD;  Location: [place] CV LAB;  Service: Cardiovascular;  Laterality: Left;  . PERIPHERAL ARTERIAL STENT GRAFT     x2 left/right  . PERIPHERAL VASCULAR BALLOON ANGIOPLASTY Right 01/06/2018   Procedure: PERIPHERAL VASCULAR BALLOON ANGIOPLASTY;  Surgeon: Katha Cabal, MD;  Location: [place] CV LAB;  Service: Cardiovascular;  Laterality: Right;  . PERIPHERAL VASCULAR CATHETERIZATION N/A 07/06/2015   Procedure: Abdominal Aortogram w/Lower Extremity;  Surgeon: Algernon Huxley, MD;  Location: [place] CV LAB;  Service: Cardiovascular;  Laterality: N/A;  . PERIPHERAL VASCULAR CATHETERIZATION  07/06/2015   Procedure: Lower Extremity Intervention;  Surgeon: Algernon Huxley, MD;  Location: [place] CV LAB;  Service: Cardiovascular;;    Social History Social History   Tobacco Use  . Smoking status: Current Every Day Smoker    Packs/day: 1.00    Years: 41.00    Pack years: 41.00    Types: Cigarettes  . Smokeless tobacco: Never Used  Substance Use Topics  . Alcohol use: No  . Drug use: No     Family History Family History  Problem Relation Age of Onset  . Heart attack Father   . Heart disease Father   . Hypertension Father   . Hyperlipidemia Father   . Hypertension Mother   . Hyperlipidemia Mother      Allergies  Allergen  Reactions  . Dulaglutide Anaphylaxis, Diarrhea and Hives  . Other Itching    Skin itching associated with nitro patch  . Prednisone Rash   Review of Systems: Negative Unless Checked Constitutional: [] ?Weight loss[] ?Fever[] ?Chills Cardiac:[] ?Chest pain[] ? Atrial Fibrillation[] ?Palpitations [] ?Shortness of breath when laying flat [] ?Shortness of breath with exertion. [] ?Shortness of breath at rest Vascular: [] ?Pain in legs with walking[] ?Pain in legswith standing[] ?Pain in legs when laying flat [] ?Claudication  [] ?Pain in feet when laying flat [] ?History of DVT [] ?Phlebitis [x] ?Swelling in legs [x] ?Varicose veins [x] ?Non-healing ulcers Pulmonary: [] ?Uses home oxygen [] ?Productive cough[] ?Hemoptysis [] ?Wheeze [x] ?COPD [] ?Asthma Neurologic: [] ?Dizziness[] ?Seizures [] ?Blackouts[] ?History of stroke [] ?History of TIA[] ?Aphasia [] ?Temporary Blindness[] ?Weaknessor numbness in arm [x] ?Weakness or numbnessin leg Musculoskeletal:[] ?Joint swelling [] ?Joint pain [] ?Low back pain  [] ? History of Knee Replacement [x] ?Arthritis [] ?back Surgeries[] ? Spinal Stenosis  Hematologic:[] ?Easy bruising[] ?Easy bleeding [] ?Hypercoagulable state [] ?Anemic Gastrointestinal:[] ?Diarrhea [] ?Vomiting[] ?Gastroesophageal reflux/heartburn[] ?Difficulty swallowing. [] ?Abdominal pain Genitourinary: [] ?Chronic kidney disease [] ?Difficulturination [] ?Anuric[] ?Blood in urine [] ?Frequenturination [] ?Burning with urination[] ?Hematuria Skin: [] ?Rashes [x] ?Ulcers [x] ?Wounds Psychological: [] ?History of anxiety[] ?History of major depression  [] ? Memory   Physical Examination  BP 114/73 (BP Location: Right Arm)   Pulse 85   Resp 16   Ht 5\' 7"  (1.702 m)   Wt 217 lb (98.4 kg)   BMI 33.99 kg/m  Gen:  WD/WN, NAD Head: [place]/AT, No temporalis wasting. Ear/Nose/Throat: Hearing grossly intact, nares w/o erythema or drainage Eyes:  Conjunctiva clear. Sclera non-icteric Neck: Supple.  Trachea midline Pulmonary:  Good air movement, no use of accessory muscles.  Cardiac: RRR, no JVD Vascular:  Vessel Right Left  Radial Palpable Palpable                       Musculoskeletal: M/S 5/5 throughout.  No deformity or atrophy.  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  No significant left lower extremity edema. Neurologic: Sensation grossly intact in extremities.  Symmetrical.  Speech is fluent.  Psychiatric: Judgment intact, Mood & affect appropriate for pt's clinical situation. Dermatologic: No rashes or ulcers noted.  No cellulitis or open wounds.  Previous ulcerations have healed       Labs Recent Results (from the past 2160 hour(s))  Basic metabolic panel     Status: Abnormal   Collection Time: 11/05/18  6:44 PM  Result Value Ref Range   Sodium 132 (L) 135 - 145 mmol/L   Potassium 4.2 3.5 - 5.1 mmol/L   Chloride 95 (L) 98 - 111 mmol/L   CO2 27 22 - 32 mmol/L   Glucose, Bld 142 (H) 70 - 99 mg/dL   BUN 14 6 - 20 mg/dL   Creatinine, Ser 1.02 0.61 - 1.24 mg/dL   Calcium 8.6 (L) 8.9 - 10.3 mg/dL   GFR calc non Af Amer >60 >60 mL/min   GFR calc Af Amer >60 >60 mL/min   Anion gap 10 5 - 15    Comment: Performed at [facility], [address] [place], [place] [address]  CBC     Status: Abnormal   Collection Time: 11/05/18  6:44 PM  Result Value Ref Range   WBC 7.0 4.0 - 10.5 K/uL   RBC 5.66 4.22 - 5.81 MIL/uL   Hemoglobin 10.9 (L) 13.0 - 17.0 g/dL   HCT 36.9 (L) 39.0 - 52.0 %   MCV 65.2 (L) 80.0 - 100.0 fL   MCH 19.3 (L) 26.0 - 34.0 pg   MCHC 29.5 (L) 30.0 - 36.0 g/dL   RDW 20.7 (H) 11.5 - 15.5 %   Platelets 289 150 - 400 K/uL   nRBC 0.0 0.0 - 0.2 %    Comment: Performed at [facility], [address] [place], [place] [address]  Troponin I - ONCE - STAT     Status: None   Collection Time: 11/05/18  6:44 PM  Result Value Ref Range   Troponin I <0.03 <0.03 ng/mL    Comment:  Performed at [facility], [address] [place], [place] [address]  Brain natriuretic peptide     Status: Abnormal   Collection Time: 11/05/18  6:44 PM  Result Value Ref Range   B Natriuretic Peptide 1,399.0 (H) 0.0 - 100.0 pg/mL    Comment: Performed at [facility], [address] [place], [place] [address]  SARS Coronavirus 2 (CEPHEID - Performed in [place] hospital lab), Hosp Order     Status: None   Collection Time: 11/05/18  9:19 PM   Specimen: Nasopharyngeal Swab  Result Value Ref Range   SARS Coronavirus 2 NEGATIVE NEGATIVE    Comment: (NOTE) If result is NEGATIVE SARS-CoV-2 target nucleic acids are NOT DETECTED. The SARS-CoV-2 RNA is generally detectable in upper and lower  respiratory specimens during the acute phase of infection. The lowest  concentration of SARS-CoV-2 viral copies this assay can detect is 250  copies / mL. A negative result does not preclude SARS-CoV-2 infection  and should not be used as the sole basis for treatment or other  patient management decisions.  A negative result may occur with  improper specimen collection / handling, submission of specimen other  than nasopharyngeal swab, presence of viral mutation(s) within the  areas targeted by this assay, and inadequate number of viral copies  (<250 copies / mL). A negative result must be combined with clinical  observations, patient history, and epidemiological information. If result is POSITIVE SARS-CoV-2 target nucleic acids are DETECTED. The SARS-CoV-2 RNA is generally detectable  in upper and lower  respiratory specimens dur ing the acute phase of infection.  Positive  results are indicative of active infection with SARS-CoV-2.  Clinical  correlation with patient history and other diagnostic information is  necessary to determine patient infection status.  Positive results do  not rule out bacterial infection or co-infection with other viruses. If result is PRESUMPTIVE  POSTIVE SARS-CoV-2 nucleic acids MAY BE PRESENT.   A presumptive positive result was obtained on the submitted specimen  and confirmed on repeat testing.  While 2019 novel coronavirus  (SARS-CoV-2) nucleic acids may be present in the submitted sample  additional confirmatory testing may be necessary for epidemiological  and / or clinical management purposes  to differentiate between  SARS-CoV-2 and other Sarbecovirus currently known to infect humans.  If clinically indicated additional testing with an alternate test  methodology [phone]) is advised. The SARS-CoV-2 RNA is generally  detectable in upper and lower respiratory sp ecimens during the acute  phase of infection. The expected result is Negative. Fact Sheet for Patients:  StrictlyIdeas.no Fact Sheet for Healthcare Providers: BankingDealers.co.za This test is not yet approved or cleared by the [place] FDA and has been authorized for detection and/or diagnosis of SARS-CoV-2 by FDA under an Emergency Use Authorization (EUA).  This EUA will remain in effect (meaning this test can be used) for the duration of the COVID-19 declaration under Section 564(b)(1) of the Act, 21 U.S.C. section 360bbb-3(b)(1), unless the authorization is terminated or revoked sooner. Performed at [facility], [address] [place], [place] [address]   Glucose, capillary     Status: None   Collection Time: 11/06/18 12:35 AM  Result Value Ref Range   Glucose-Capillary 79 70 - 99 mg/dL  Basic metabolic panel     Status: Abnormal   Collection Time: 11/06/18  4:55 AM  Result Value Ref Range   Sodium 135 135 - 145 mmol/L   Potassium 4.3 3.5 - 5.1 mmol/L   Chloride 96 (L) 98 - 111 mmol/L   CO2 28 22 - 32 mmol/L   Glucose, Bld 114 (H) 70 - 99 mg/dL   BUN 16 6 - 20 mg/dL   Creatinine, Ser 0.92 0.61 - 1.24 mg/dL   Calcium 8.7 (L) 8.9 - 10.3 mg/dL   GFR calc non Af Amer >60 >60 mL/min   GFR  calc Af Amer >60 >60 mL/min   Anion gap 11 5 - 15    Comment: Performed at [facility], [address] [place], [place] [address]  CBC     Status: Abnormal   Collection Time: 11/06/18  4:55 AM  Result Value Ref Range   WBC 6.7 4.0 - 10.5 K/uL   RBC 5.72 4.22 - 5.81 MIL/uL   Hemoglobin 10.9 (L) 13.0 - 17.0 g/dL   HCT 37.5 (L) 39.0 - 52.0 %   MCV 65.6 (L) 80.0 - 100.0 fL   MCH 19.1 (L) 26.0 - 34.0 pg   MCHC 29.1 (L) 30.0 - 36.0 g/dL   RDW 20.7 (H) 11.5 - 15.5 %   Platelets 275 150 - 400 K/uL   nRBC 0.0 0.0 - 0.2 %    Comment: Performed at [facility], [address] [place], [place] [address]  Glucose, capillary     Status: Abnormal   Collection Time: 11/06/18  8:28 AM  Result Value Ref Range   Glucose-Capillary 109 (H) 70 - 99 mg/dL  ECHOCARDIOGRAM COMPLETE     Status: None  Collection Time: 11/06/18  9:59 AM  Result Value Ref Range   Weight 3,756.81 oz   Height 67 in   BP 128/87 mmHg  Glucose, capillary     Status: Abnormal   Collection Time: 11/06/18 11:36 AM  Result Value Ref Range   Glucose-Capillary 121 (H) 70 - 99 mg/dL  Glucose, capillary     Status: Abnormal   Collection Time: 11/06/18  5:09 PM  Result Value Ref Range   Glucose-Capillary 164 (H) 70 - 99 mg/dL  Glucose, capillary     Status: Abnormal   Collection Time: 11/06/18  8:56 PM  Result Value Ref Range   Glucose-Capillary 162 (H) 70 - 99 mg/dL  Glucose, capillary     Status: Abnormal   Collection Time: 11/07/18  8:07 AM  Result Value Ref Range   Glucose-Capillary 115 (H) 70 - 99 mg/dL   Comment 1 Notify RN    Comment 2 Document in Chart   Glucose, capillary     Status: Abnormal   Collection Time: 11/07/18 11:24 AM  Result Value Ref Range   Glucose-Capillary 243 (H) 70 - 99 mg/dL   Comment 1 Notify RN    Comment 2 Document in Chart   Glucose, capillary     Status: Abnormal   Collection Time: 11/07/18  4:45 PM  Result Value Ref Range   Glucose-Capillary 180 (H) 70 - 99 mg/dL    Comment 1 Notify RN    Comment 2 Document in Chart   Glucose, capillary     Status: Abnormal   Collection Time: 11/07/18  9:16 PM  Result Value Ref Range   Glucose-Capillary 139 (H) 70 - 99 mg/dL  Basic metabolic panel     Status: Abnormal   Collection Time: 11/08/18  5:11 AM  Result Value Ref Range   Sodium 132 (L) 135 - 145 mmol/L   Potassium 4.2 3.5 - 5.1 mmol/L   Chloride 94 (L) 98 - 111 mmol/L   CO2 30 22 - 32 mmol/L   Glucose, Bld 97 70 - 99 mg/dL   BUN 23 (H) 6 - 20 mg/dL   Creatinine, Ser 1.09 0.61 - 1.24 mg/dL   Calcium 8.5 (L) 8.9 - 10.3 mg/dL   GFR calc non Af Amer >60 >60 mL/min   GFR calc Af Amer >60 >60 mL/min   Anion gap 8 5 - 15    Comment: Performed at [facility], [address] [place], [place] [address]  Glucose, capillary     Status: None   Collection Time: 11/08/18  7:50 AM  Result Value Ref Range   Glucose-Capillary 88 70 - 99 mg/dL  Glucose, capillary     Status: Abnormal   Collection Time: 11/08/18 11:50 AM  Result Value Ref Range   Glucose-Capillary 128 (H) 70 - 99 mg/dL   Comment 1 Notify RN    Comment 2 Document in Chart   Glucose, capillary     Status: Abnormal   Collection Time: 11/08/18  4:58 PM  Result Value Ref Range   Glucose-Capillary 126 (H) 70 - 99 mg/dL   Comment 1 Notify RN    Comment 2 Document in Chart   Glucose, capillary     Status: Abnormal   Collection Time: 11/08/18  9:25 PM  Result Value Ref Range   Glucose-Capillary 133 (H) 70 - 99 mg/dL  Basic metabolic panel     Status: Abnormal   Collection Time: 11/09/18  3:08 AM  Result Value Ref Range  Sodium 129 (L) 135 - 145 mmol/L   Potassium 4.4 3.5 - 5.1 mmol/L   Chloride 91 (L) 98 - 111 mmol/L   CO2 30 22 - 32 mmol/L   Glucose, Bld 95 70 - 99 mg/dL   BUN 27 (H) 6 - 20 mg/dL   Creatinine, Ser 1.22 0.61 - 1.24 mg/dL   Calcium 8.3 (L) 8.9 - 10.3 mg/dL   GFR calc non Af Amer >60 >60 mL/min   GFR calc Af Amer >60 >60 mL/min   Anion gap 8 5 - 15    Comment:  Performed at [facility], [address] [place], [place] [address]  Lipid panel     Status: Abnormal   Collection Time: 11/09/18  3:08 AM  Result Value Ref Range   Cholesterol 80 0 - 200 mg/dL   Triglycerides 42 <150 mg/dL   HDL 26 (L) >40 mg/dL   Total CHOL/HDL Ratio 3.1 RATIO   VLDL 8 0 - 40 mg/dL   LDL Cholesterol 46 0 - 99 mg/dL    Comment:        Total Cholesterol/HDL:CHD Risk Coronary Heart Disease Risk Table                     Men   Women  1/2 Average Risk   3.4   3.3  Average Risk       5.0   4.4  2 X Average Risk   9.6   7.1  3 X Average Risk  23.4   11.0        Use the calculated Patient Ratio above and the CHD Risk Table to determine the patient's CHD Risk.        ATP III CLASSIFICATION (LDL):  <100     mg/dL   Optimal  100-129  mg/dL   Near or Above                    Optimal  130-159  mg/dL   Borderline  160-189  mg/dL   High  >190     mg/dL   Very High Performed at [facility], [address] [place], [place] [address]   Glucose, capillary     Status: Abnormal   Collection Time: 11/09/18  8:00 AM  Result Value Ref Range   Glucose-Capillary 117 (H) 70 - 99 mg/dL  Glucose, capillary     Status: None   Collection Time: 11/09/18 12:16 PM  Result Value Ref Range   Glucose-Capillary 91 70 - 99 mg/dL  Basic metabolic panel     Status: Abnormal   Collection Time: 11/25/18  1:50 PM  Result Value Ref Range   Sodium 136 135 - 145 mmol/L   Potassium 4.5 3.5 - 5.1 mmol/L   Chloride 96 (L) 98 - 111 mmol/L   CO2 29 22 - 32 mmol/L   Glucose, Bld 127 (H) 70 - 99 mg/dL   BUN 17 6 - 20 mg/dL   Creatinine, Ser 1.12 0.61 - 1.24 mg/dL   Calcium 9.1 8.9 - 10.3 mg/dL   GFR calc non Af Amer >60 >60 mL/min   GFR calc Af Amer >60 >60 mL/min   Anion gap 11 5 - 15    Comment: Performed at [facility], [address] [place], [place] [address]  Brain natriuretic peptide     Status: Abnormal   Collection Time: 11/25/18  1:50 PM  Result  Value Ref Range   B Natriuretic  Peptide 2,086.0 (H) 0.0 - 100.0 pg/mL    Comment: Performed at [facility], [address] [place], [place] [id number]  Basic metabolic panel     Status: Abnormal   Collection Time: 11/26/18  2:15 PM  Result Value Ref Range   Sodium 130 (L) 135 - 145 mmol/L   Potassium 4.5 3.5 - 5.1 mmol/L   Chloride 90 (L) 98 - 111 mmol/L   CO2 30 22 - 32 mmol/L   Glucose, Bld 209 (H) 70 - 99 mg/dL   BUN 18 6 - 20 mg/dL   Creatinine, Ser 1.28 (H) 0.61 - 1.24 mg/dL   Calcium 8.8 (L) 8.9 - 10.3 mg/dL   GFR calc non Af Amer >60 >60 mL/min   GFR calc Af Amer >60 >60 mL/min   Anion gap 10 5 - 15    Comment: Performed at [facility], [address] [place], [place] [address]  Brain natriuretic peptide     Status: Abnormal   Collection Time: 11/26/18  2:15 PM  Result Value Ref Range   B Natriuretic Peptide 2,041.0 (H) 0.0 - 100.0 pg/mL    Comment: Performed at [facility], [address], [place], [place] [address]    Radiology Vas [place] Abi With/wo Tbi  Result Date: 12/29/2018 LOWER EXTREMITY DOPPLER STUDY Indications: Ulceration, and peripheral artery disease.  Vascular Interventions: 08/24/2018 Pta of left tibioperoneal trunk & proximal                         PTA. PTA of entire SFA and above knee popliteal. SFA                         stent. Comparison Study: 10/15/2018 Performing Technologist: Charlane Ferretti RT (R)(VS)  Examination Guidelines: A complete evaluation includes at minimum, Doppler waveform signals and systolic blood pressure reading at the level of bilateral brachial, anterior tibial, and posterior tibial arteries, when vessel segments are accessible. Bilateral testing is considered an integral part of a complete examination. Photoelectric Plethysmograph (PPG) waveforms and toe systolic pressure readings are included as required and additional duplex testing as needed. Limited examinations for reoccurring indications may be performed  as noted.  ABI Findings: +--------+------------------+-----+--------+--------+ Right   Rt Pressure (mmHg)IndexWaveformComment  +--------+------------------+-----+--------+--------+ [id number]                                     +--------+------------------+-----+--------+--------+ +---------+------------------+-----+---------+-------+ Left     Lt Pressure (mmHg)IndexWaveform Comment +---------+------------------+-----+---------+-------+ Brachial 83                                      +---------+------------------+-----+---------+-------+ ATA      126               1.02 biphasic         +---------+------------------+-----+---------+-------+ PTA      123               1.00 triphasic        +---------+------------------+-----+---------+-------+ Great [id number]               0.92                  +---------+------------------+-----+---------+-------+ +-------+-----------+-----------+------------+------------+ ABI/TBIToday's ABIToday's TBIPrevious ABIPrevious TBI +-------+-----------+-----------+------------+------------+ Right  Amputated    +-------+-----------+-----------+------------+------------+ Left   1.02       .92        .93         1.03         +-------+-----------+-----------+------------+------------+ Left ABIs appear increased compared to prior study on 10/15/2018. Left TBIs appear essentially unchanged compared to prior study on 10/15/2018.  Summary: Left: Resting left ankle-brachial index is within normal range. No evidence of significant left lower extremity arterial disease. The left toe-brachial index is normal. Although ankle brachial indices are within normal limits (0.95-1.29), arterial Doppler waveforms at the ankle suggest some component of arterial occlusive disease.  *See table(s) above for measurements and observations.  Electronically signed by Leotis Pain MD on 12/29/2018 at 1:00:19 PM.   Final      Assessment/Plan  PAD (peripheral artery disease) ([place]) Has had a BKA on the right.  No new left leg symptoms.  Hypertension blood pressure control important in reducing the progression of atherosclerotic disease. On appropriate oral medications.   Type 2 diabetes mellitus with complication, with long-term current use of insulin (HCC) blood glucose control important in reducing the progression of atherosclerotic disease. Also, involved in wound healing. On appropriate medications.   Peripheral edema Markedly improved with Unna boots.  Now that we are coming out of Unna boots, wear compression stockings daily  Venous ulcer of left leg (HCC) Healed with Unna boots.  Coming out of Unna boots today.    Leotis Pain, MD  01/19/2019 9:18 AM    This note was created with Dragon medical transcription system.  Any errors from dictation are purely unintentional

## 2019-01-19 NOTE — Assessment & Plan Note (Signed)
blood pressure control important in reducing the progression of atherosclerotic disease. On appropriate oral medications.  

## 2019-01-19 NOTE — Assessment & Plan Note (Signed)
Markedly improved with Unna boots.  Now that we are coming out of Unna boots, wear compression stockings daily

## 2019-01-19 NOTE — Assessment & Plan Note (Signed)
blood glucose control important in reducing the progression of atherosclerotic disease. Also, involved in wound healing. On appropriate medications.  

## 2019-01-19 NOTE — Assessment & Plan Note (Signed)
Healed with Unna boots.  Coming out of Unna boots today.

## 2019-01-25 NOTE — Progress Notes (Deleted)
Patient ID: [name] [name], male    DOB: Jan 03, 1961, 58 y.o.   MRN: [id number]  HPI  [name] [name] is a 58 y/o male with a history of obstructive sleep apnea, PVD, PAD, HTN, hyperlipidemia, DM, CAD, COPD, BPH, asthma, current tobacco use and chronic heart failure.   Echo report from 11/06/2018 reviewed and showed an EF of 20-25% along with moderate TR and a PA pressure of 59.6 mmHg. Echo report from 04/25/17 reviewed and shows an EF of 25-30% along with a PA pressure of 34 mm Hg. EF has declined from 55-60% back in 2016.   Cardiac catheterization done 04/28/17 showed significant three-vessel disease with a patient stent in the RCA and LAD. Significant proximal RCS disease and the stent as well as diffuse mid and distal disease. LAD has moderate disease. Severely elevated left ventricular end-diastolic pressure at 34 mmHg. Possible CABG in the future with optimizing medical management. Stress done in 2015.  Admitted 11/05/2018 due to acute on chronic HF. Cardiology consult obtained. Aggressively diuresed and discharged after 4 days.   He presents today for a follow-up visit with a chief complaint of   Past Medical History:  Diagnosis Date  . Arthritis   . Asthma   . Atherosclerosis   . BPH (benign prostatic hyperplasia)   . Carotid arterial disease ([place])   . Charcot's joint of foot, right   . Chronic combined systolic (congestive) and diastolic (congestive) heart failure ([place])    a. 02/2013 EF 50% by LV gram; b. 04/2017 Echo: EF 25-30%. diff HK. Gr2 DD. Mod dil LA/RV. PASP 45mmHg.  [name] COPD (chronic obstructive pulmonary disease) ([place])   . Coronary artery disease    a. 2013 S/P PCI of LAD [facility]);  b. 03/2013 PCI: RCA 90p ( 2.5 x 23 mm DES); c. 02/2014 Cath: patent RCA stent->Med Rx; d. 04/2017 Cath: LM nl, LAd 30ost/p, 46m/d, D1 80ost, LCX 90p/m, OM1 85, RCA 70/20p, 81m, 70d->Referred for CT Surg-felt to be poor candidate.  . Diabetic neuropathy ([place])   . Diabetic ulcer of right foot ([place])   . Hernia    . Hyperlipidemia   . Hypertension   . Ischemic cardiomyopathy    a. 04/2017 Echo: EF 25-30%; b. 08/2017 Cardiac MRI ([facility]): EF 19%, sev glob HK. RVEF 20%, mod BAE, triv [name], mild-mod TR. Basal lateral subendocardial infarct (viable), inf/infsept, dist septal ischemia, basal to mid lat peri-infarct ischemia.  . Morbid obesity ([place])   . Neuropathy   . PAD (peripheral artery disease) ([place])    a. Followed by Dr. Lucky Cowboy; b. 08/2010 Periph Angio: RSFA 70-80p (6X60 self-expanding stent); c. 09/2010 Periph Angio: L SFA 100p (7x unknown length self-expanding stent); d. 06/2015 Periph Angio: R SFA short segment occlusion (6x12 self-expanding stent).  . Restless leg syndrome   . Sleep apnea   . Subclavian artery stenosis, right ([place])   . Syncope and collapse   . Tobacco use    a. 75+ yr hx - still smoking 1ppd, down from 2 ppd.  . Type II diabetes mellitus ([place])   . Varicose vein    Past Surgical History:  Procedure Laterality Date  . ABDOMINAL AORTAGRAM N/A 06/23/2013   Procedure: ABDOMINAL Maxcine Ham;  Surgeon: Wellington Hampshire, MD;  Location: [place] CATH LAB;  Service: Cardiovascular;  Laterality: N/A;  . AMPUTATION Right 04/01/2018   Procedure: AMPUTATION BELOW KNEE;  Surgeon: Algernon Huxley, MD;  Location: [facility] ORS;  Service: Vascular;  Laterality: Right;  . APPENDECTOMY    .  CARDIAC CATHETERIZATION  10/14   [facility]; X1 STENT PROXIMAL RCA  . CARDIAC CATHETERIZATION  02/2010   [facility]  . CARDIAC CATHETERIZATION  02/21/2014   [facility]  . CLAVICLE SURGERY    . HERNIA REPAIR    . IRRIGATION AND DEBRIDEMENT FOOT Right 01/04/2018   Procedure: IRRIGATION AND DEBRIDEMENT FOOT;  Surgeon: Samara Deist, DPM;  Location: [facility] ORS;  Service: Podiatry;  Laterality: Right;  . LEFT HEART CATH AND CORS/GRAFTS ANGIOGRAPHY N/A 04/28/2017   Procedure: LEFT HEART CATH AND CORONARY ANGIOGRAPHY;  Surgeon: Wellington Hampshire, MD;  Location: [place] CV LAB;  Service: Cardiovascular;  Laterality: N/A;  . LOWER EXTREMITY ANGIOGRAPHY  Right 03/03/2018   Procedure: LOWER EXTREMITY ANGIOGRAPHY;  Surgeon: Katha Cabal, MD;  Location: [place] CV LAB;  Service: Cardiovascular;  Laterality: Right;  . LOWER EXTREMITY ANGIOGRAPHY Left 08/24/2018   Procedure: LOWER EXTREMITY ANGIOGRAPHY;  Surgeon: Algernon Huxley, MD;  Location: [place] CV [facility];  Service: Cardiovascular;  Laterality: Left;  . PERIPHERAL ARTERIAL STENT GRAFT     x2 left/right  . PERIPHERAL VASCULAR BALLOON ANGIOPLASTY Right 01/06/2018   Procedure: PERIPHERAL VASCULAR BALLOON ANGIOPLASTY;  Surgeon: Katha Cabal, MD;  Location: [place] CV [facility];  Service: Cardiovascular;  Laterality: Right;  . PERIPHERAL VASCULAR CATHETERIZATION N/A 07/06/2015   Procedure: Abdominal Aortogram w/Lower Extremity;  Surgeon: Algernon Huxley, MD;  Location: [place] CV LAB;  Service: Cardiovascular;  Laterality: N/A;  . PERIPHERAL VASCULAR CATHETERIZATION  07/06/2015   Procedure: Lower Extremity Intervention;  Surgeon: Algernon Huxley, MD;  Location: [place] CV [facility];  Service: Cardiovascular;;   Family History  Problem Relation Age of Onset  . Heart attack Father   . Heart disease Father   . Hypertension Father   . Hyperlipidemia Father   . Hypertension Mother   . Hyperlipidemia Mother    Social History   Tobacco Use  . Smoking status: Current Every Day Smoker    Packs/day: 1.00    Years: 41.00    Pack years: 41.00    Types: Cigarettes  . Smokeless tobacco: Never Used  Substance Use Topics  . Alcohol use: No   Allergies  Allergen Reactions  . Dulaglutide Anaphylaxis, Diarrhea and Hives  . Other Itching    Skin itching associated with nitro patch  . Prednisone Rash     Review of Systems  Constitutional: Positive for fatigue (easily). Negative for appetite change.  HENT: Negative for congestion, postnasal drip and sore throat.   Eyes: Negative.   Respiratory: Positive for cough and shortness of breath (with minimal exertion). Negative for chest  tightness and wheezing.   Cardiovascular: Positive for leg swelling. Negative for chest pain and palpitations.  Gastrointestinal: Positive for abdominal distention. Negative for abdominal pain.  Endocrine: Negative.   Genitourinary: Negative.   Musculoskeletal: Positive for back pain. Negative for neck pain.       Has right lower leg prosthesis  Skin: Negative.   Allergic/Immunologic: Negative.   Neurological: Positive for light-headedness (at times). Negative for dizziness.  Hematological: Negative for adenopathy. Bruises/bleeds easily.  Psychiatric/Behavioral: Positive for sleep disturbance (sleeping on 3 pillows). Negative for dysphoric mood. The patient is not nervous/anxious.      Physical Exam  Constitutional: He is oriented to person, place, and time. He appears well-developed and well-nourished.  HENT:  Head: Normocephalic and atraumatic.  Neck: Normal range of motion. Neck supple. No JVD present.  Cardiovascular: Regular rhythm. Tachycardia present.  Pulmonary/Chest: Effort normal. He has no  wheezes. He has no rales.  Abdominal: Soft. He exhibits distension. There is no abdominal tenderness.  Musculoskeletal:        General: Edema (3+ pitting edema in left lower leg to the knee) present. No tenderness.  Neurological: He is alert and oriented to person, place, and time.  Skin: Skin is warm and dry.  Psychiatric: He has a normal mood and affect. His behavior is normal. Thought content normal.  Nursing note and vitals reviewed.   Assessment & Plan:  1: Acute on Chronic heart failure with reduced ejection fraction- - NYHA class III - moderately fluid overloaded today with pitting edema to his knee - weighing daily and he was reminded to call for an overnight weight gain of >2 pounds or a weekly weight gain of >5 pounds - weight 228 pounds from last visit here 2 months ago - discuss entresto/ farxiga - not adding salt and has been using Mrs. Dash & trying to read food  labels.  - has [place] - had telemedicine visit with cardiologist Rockey Situ) 12/14/2018 - BNP 11/26/2018 was 2041.0  2: HTN- - BP - saw PCP Vickki Muff) 12/30/2018 - BMP done 11/26/2018 reviewed and shows sodium 130, potassium 4.5, creatinine 1.28 and GFR >60  3: Diabetes- - glucose at home was - A1c on 11/21/17 was 8.5%  4: Tobacco use- - currently smoking <1/2 ppd of cigarettes - complete cessation discussed for 3 minutes with him  Patient did not bring his medications nor a list. Each medication was verbally reviewed with the patient and he was encouraged to bring the bottles to every visit to confirm accuracy of list.

## 2019-01-27 ENCOUNTER — Ambulatory Visit: Payer: Medicare Other | Admitting: Family

## 2019-02-22 ENCOUNTER — Other Ambulatory Visit: Payer: Self-pay | Admitting: Family

## 2019-02-22 ENCOUNTER — Ambulatory Visit: Payer: Medicare Other | Attending: Family | Admitting: Family

## 2019-02-22 ENCOUNTER — Other Ambulatory Visit: Payer: Self-pay

## 2019-02-22 ENCOUNTER — Encounter: Payer: Self-pay | Admitting: Family

## 2019-02-22 VITALS — BP 106/68 | HR 105 | Resp 18 | Ht 67.0 in | Wt 221.5 lb

## 2019-02-22 DIAGNOSIS — I2510 Atherosclerotic heart disease of native coronary artery without angina pectoris: Secondary | ICD-10-CM | POA: Insufficient documentation

## 2019-02-22 DIAGNOSIS — Z955 Presence of coronary angioplasty implant and graft: Secondary | ICD-10-CM | POA: Diagnosis not present

## 2019-02-22 DIAGNOSIS — Z79899 Other long term (current) drug therapy: Secondary | ICD-10-CM | POA: Diagnosis not present

## 2019-02-22 DIAGNOSIS — L97509 Non-pressure chronic ulcer of other part of unspecified foot with unspecified severity: Secondary | ICD-10-CM

## 2019-02-22 DIAGNOSIS — Z720 Tobacco use: Secondary | ICD-10-CM

## 2019-02-22 DIAGNOSIS — I5023 Acute on chronic systolic (congestive) heart failure: Secondary | ICD-10-CM

## 2019-02-22 DIAGNOSIS — E11621 Type 2 diabetes mellitus with foot ulcer: Secondary | ICD-10-CM

## 2019-02-22 DIAGNOSIS — E1140 Type 2 diabetes mellitus with diabetic neuropathy, unspecified: Secondary | ICD-10-CM | POA: Diagnosis not present

## 2019-02-22 DIAGNOSIS — G2581 Restless legs syndrome: Secondary | ICD-10-CM | POA: Insufficient documentation

## 2019-02-22 DIAGNOSIS — F17210 Nicotine dependence, cigarettes, uncomplicated: Secondary | ICD-10-CM | POA: Diagnosis not present

## 2019-02-22 DIAGNOSIS — I5042 Chronic combined systolic (congestive) and diastolic (congestive) heart failure: Secondary | ICD-10-CM | POA: Diagnosis not present

## 2019-02-22 DIAGNOSIS — R0602 Shortness of breath: Secondary | ICD-10-CM | POA: Diagnosis present

## 2019-02-22 DIAGNOSIS — Z7982 Long term (current) use of aspirin: Secondary | ICD-10-CM | POA: Diagnosis not present

## 2019-02-22 DIAGNOSIS — R42 Dizziness and giddiness: Secondary | ICD-10-CM | POA: Diagnosis not present

## 2019-02-22 DIAGNOSIS — Z794 Long term (current) use of insulin: Secondary | ICD-10-CM | POA: Insufficient documentation

## 2019-02-22 DIAGNOSIS — I110 Hypertensive heart disease with heart failure: Secondary | ICD-10-CM | POA: Insufficient documentation

## 2019-02-22 DIAGNOSIS — I252 Old myocardial infarction: Secondary | ICD-10-CM | POA: Insufficient documentation

## 2019-02-22 DIAGNOSIS — I10 Essential (primary) hypertension: Secondary | ICD-10-CM

## 2019-02-22 DIAGNOSIS — E1151 Type 2 diabetes mellitus with diabetic peripheral angiopathy without gangrene: Secondary | ICD-10-CM | POA: Insufficient documentation

## 2019-02-22 DIAGNOSIS — R140 Abdominal distension (gaseous): Secondary | ICD-10-CM | POA: Insufficient documentation

## 2019-02-22 DIAGNOSIS — E785 Hyperlipidemia, unspecified: Secondary | ICD-10-CM | POA: Insufficient documentation

## 2019-02-22 DIAGNOSIS — I255 Ischemic cardiomyopathy: Secondary | ICD-10-CM | POA: Insufficient documentation

## 2019-02-22 DIAGNOSIS — J449 Chronic obstructive pulmonary disease, unspecified: Secondary | ICD-10-CM | POA: Insufficient documentation

## 2019-02-22 DIAGNOSIS — Z8249 Family history of ischemic heart disease and other diseases of the circulatory system: Secondary | ICD-10-CM | POA: Diagnosis not present

## 2019-02-22 DIAGNOSIS — G4733 Obstructive sleep apnea (adult) (pediatric): Secondary | ICD-10-CM | POA: Insufficient documentation

## 2019-02-22 DIAGNOSIS — M1990 Unspecified osteoarthritis, unspecified site: Secondary | ICD-10-CM | POA: Diagnosis not present

## 2019-02-22 NOTE — Progress Notes (Signed)
Patient ID: [name] [name], male    DOB: 05-May-1961, 58 y.o.   MRN: [id number]  HPI  [name] [name] is a 58 y/o male with a history of obstructive sleep apnea, PVD, PAD, HTN, hyperlipidemia, DM, CAD, COPD, BPH, asthma, current tobacco use and chronic heart failure.   Echo report from 11/06/2018 reviewed and showed an EF of 20-25% along with moderate TR and a PA pressure of 59.6 mmHg. Echo report from 04/25/17 reviewed and shows an EF of 25-30% along with a PA pressure of 34 mm Hg. EF has declined from 55-60% back in 2016.   Cardiac catheterization done 04/28/17 showed significant three-vessel disease with a patient stent in the RCA and LAD. Significant proximal RCS disease and the stent as well as diffuse mid and distal disease. LAD has moderate disease. Severely elevated left ventricular end-diastolic pressure at 34 mmHg. Possible CABG in the future with optimizing medical management. Stress done in 2015.  Admitted 11/05/2018 due to acute on chronic HF. Cardiology consult obtained. Aggressively diuresed and discharged after 4 days.   He presents today for a follow-up visit with a chief complaint of moderate shortness of breath upon minimal exertion. He describes this as chronic in nature having been present for several years although he does feel like it's worsened over the last week. He has associated fatigue, pedal edema, abdominal distention, light-headedness and difficulty sleeping along with this. He denies any palpitations, chest pain, wheezing, cough or weight gain.   Saw his PCP on 02/16/2019 but doesn't think any medication adjustments were made. He did bring his medication bottles with him but was still missing some medications by our list but he says that if the bottle isn't there, then he's not taking it.   Past Medical History:  Diagnosis Date  . Arthritis   . Asthma   . Atherosclerosis   . BPH (benign prostatic hyperplasia)   . Carotid arterial disease ([place])   . Charcot's joint of foot,  right   . Chronic combined systolic (congestive) and diastolic (congestive) heart failure ([place])    a. 02/2013 EF 50% by LV gram; b. 04/2017 Echo: EF 25-30%. diff HK. Gr2 DD. Mod dil LA/RV. PASP 77mmHg.  [name] COPD (chronic obstructive pulmonary disease) ([place])   . Coronary artery disease    a. 2013 S/P PCI of LAD [facility]);  b. 03/2013 PCI: RCA 90p ( 2.5 x 23 mm DES); c. 02/2014 Cath: patent RCA stent->Med Rx; d. 04/2017 Cath: LM nl, LAd 30ost/p, 58m/d, D1 80ost, LCX 90p/m, OM1 85, RCA 70/20p, 46m, 70d->Referred for CT Surg-felt to be poor candidate.  . Diabetic neuropathy ([place])   . Diabetic ulcer of right foot ([place])   . Hernia   . Hyperlipidemia   . Hypertension   . Ischemic cardiomyopathy    a. 04/2017 Echo: EF 25-30%; b. 08/2017 Cardiac MRI ([facility]): EF 19%, sev glob HK. RVEF 20%, mod BAE, triv [name], mild-mod TR. Basal lateral subendocardial infarct (viable), inf/infsept, dist septal ischemia, basal to mid lat peri-infarct ischemia.  . Morbid obesity ([place])   . Neuropathy   . PAD (peripheral artery disease) ([place])    a. Followed by Dr. Lucky Cowboy; b. 08/2010 Periph Angio: RSFA 70-80p (6X60 self-expanding stent); c. 09/2010 Periph Angio: L SFA 100p (7x unknown length self-expanding stent); d. 06/2015 Periph Angio: R SFA short segment occlusion (6x12 self-expanding stent).  . Restless leg syndrome   . Sleep apnea   . Subclavian artery stenosis, right ([place])   . Syncope and collapse   .  Tobacco use    a. 75+ yr hx - still smoking 1ppd, down from 2 ppd.  . Type II diabetes mellitus ([place])   . Varicose vein    Past Surgical History:  Procedure Laterality Date  . ABDOMINAL AORTAGRAM N/A 06/23/2013   Procedure: ABDOMINAL Maxcine Ham;  Surgeon: Wellington Hampshire, MD;  Location: [place] CATH LAB;  Service: Cardiovascular;  Laterality: N/A;  . AMPUTATION Right 04/01/2018   Procedure: AMPUTATION BELOW KNEE;  Surgeon: Algernon Huxley, MD;  Location: [facility] ORS;  Service: Vascular;  Laterality: Right;  . APPENDECTOMY    . CARDIAC  CATHETERIZATION  10/14   [facility]; X1 STENT PROXIMAL RCA  . CARDIAC CATHETERIZATION  02/2010   [facility]  . CARDIAC CATHETERIZATION  02/21/2014   [facility]  . CLAVICLE SURGERY    . HERNIA REPAIR    . IRRIGATION AND DEBRIDEMENT FOOT Right 01/04/2018   Procedure: IRRIGATION AND DEBRIDEMENT FOOT;  Surgeon: Samara Deist, DPM;  Location: [facility] ORS;  Service: Podiatry;  Laterality: Right;  . LEFT HEART CATH AND CORS/GRAFTS ANGIOGRAPHY N/A 04/28/2017   Procedure: LEFT HEART CATH AND CORONARY ANGIOGRAPHY;  Surgeon: Wellington Hampshire, MD;  Location: [place] CV LAB;  Service: Cardiovascular;  Laterality: N/A;  . LOWER EXTREMITY ANGIOGRAPHY Right 03/03/2018   Procedure: LOWER EXTREMITY ANGIOGRAPHY;  Surgeon: Katha Cabal, MD;  Location: [place] CV LAB;  Service: Cardiovascular;  Laterality: Right;  . LOWER EXTREMITY ANGIOGRAPHY Left 08/24/2018   Procedure: LOWER EXTREMITY ANGIOGRAPHY;  Surgeon: Algernon Huxley, MD;  Location: [place] CV LAB;  Service: Cardiovascular;  Laterality: Left;  . PERIPHERAL ARTERIAL STENT GRAFT     x2 left/right  . PERIPHERAL VASCULAR BALLOON ANGIOPLASTY Right 01/06/2018   Procedure: PERIPHERAL VASCULAR BALLOON ANGIOPLASTY;  Surgeon: Katha Cabal, MD;  Location: [place] CV LAB;  Service: Cardiovascular;  Laterality: Right;  . PERIPHERAL VASCULAR CATHETERIZATION N/A 07/06/2015   Procedure: Abdominal Aortogram w/Lower Extremity;  Surgeon: Algernon Huxley, MD;  Location: [place] CV LAB;  Service: Cardiovascular;  Laterality: N/A;  . PERIPHERAL VASCULAR CATHETERIZATION  07/06/2015   Procedure: Lower Extremity Intervention;  Surgeon: Algernon Huxley, MD;  Location: [place] CV LAB;  Service: Cardiovascular;;   Family History  Problem Relation Age of Onset  . Heart attack Father   . Heart disease Father   . Hypertension Father   . Hyperlipidemia Father   . Hypertension Mother   . Hyperlipidemia Mother    Social History   Tobacco Use  . Smoking status:  Current Every Day Smoker    Packs/day: 1.00    Years: 41.00    Pack years: 41.00    Types: Cigarettes  . Smokeless tobacco: Never Used  Substance Use Topics  . Alcohol use: No   Allergies  Allergen Reactions  . Dulaglutide Anaphylaxis, Diarrhea and Hives  . Other Itching    Skin itching associated with nitro patch  . Prednisone Rash   Prior to Admission medications   Medication Sig Start Date End Date Taking? Authorizing Provider  albuterol (VENTOLIN HFA) 108 (90 Base) MCG/ACT inhaler Inhale into the lungs every 6 (six) hours as needed for wheezing or shortness of breath.   Yes [provider]  aspirin EC 81 MG tablet Take 1 tablet (81 mg total) by mouth daily. 05/11/15  Yes Wellington Hampshire, MD  atorvastatin (LIPITOR) 80 MG tablet Take 80 mg by mouth daily.   Yes [provider]  calcium carbonate (TUMS - DOSED IN MG ELEMENTAL CALCIUM)  500 MG chewable tablet Chew 4 tablets by mouth daily as needed for indigestion or heartburn.   Yes [provider]  clopidogrel (PLAVIX) 75 MG tablet Take 75 mg by mouth daily.  08/22/17  Yes [provider]  diphenhydrAMINE (BENADRYL) 25 MG tablet Take 25 mg by mouth daily as needed for allergies.   Yes [provider]  ezetimibe (ZETIA) 10 MG tablet Take 10 mg by mouth daily.   Yes [provider]  gabapentin (NEURONTIN) 800 MG tablet Take 800 mg by mouth 4 (four) times daily.  08/14/17 02/22/19 Yes [provider]  Insulin Glargine (LANTUS SOLOSTAR) 100 UNIT/ML Solostar Pen Inject 30 Units into the skin at bedtime. 04/07/18  Yes Mayo, Pete Pelt, MD  insulin lispro (HUMALOG) 100 UNIT/ML injection Sliding Scale Insulin before meals using Novolog If BS is 0 to 70 no insulin and eat/glucose tablets/glucose get If BS 71 to 150 great! If BS 151 to 200, 2 Units Novolog If BS 201 to 250, 4 Units Novolog If BS 251 to 300, 6 Units Novolog If BS 301 to 350, 8 Units Novolog If BS 351 to 400, 10 Units Novolog  If BS greater than 400, take 10 Units and Contact our office during office hour 04/14/18  Yes [provider]  ipratropium-albuterol (DUONEB) 0.5-2.5 (3) MG/3ML SOLN Inhale 3 mLs into the lungs every 6 (six) hours as needed (wheezing/sob).    Yes [provider]  metFORMIN (GLUCOPHAGE-XR) 500 MG 24 hr tablet Take 1,000 mg by mouth 2 (two) times daily.    Yes [provider]  metoprolol succinate (TOPROL-XL) 25 MG 24 hr tablet Take 12.5 mg by mouth daily.  03/19/18 03/19/19 Yes [provider]  nitroGLYCERIN (NITRODUR - DOSED IN MG/24 HR) 0.4 mg/hr patch Place 0.4 mg onto the skin daily.   Yes [provider]  nitroGLYCERIN (NITROSTAT) 0.4 MG SL tablet Place 0.4 mg under the tongue every 5 (five) minutes as needed for chest pain.   Yes [provider]  Oxycodone HCl 10 MG TABS Take 10 mg by mouth every 6 (six) hours as needed (pain).  01/15/18  Yes [provider]  potassium chloride (K-DUR) 10 MEQ tablet Take 10 mEq by mouth daily.   Yes [provider]  promethazine (PHENERGAN) 25 MG tablet TAKE 1 TABLET BY MOUTH EVERY 6 HOURS AS NEEDED NAUSEA 04/29/18  Yes [provider]  spironolactone (ALDACTONE) 25 MG tablet Take 25 mg by mouth daily.  03/02/18  Yes [provider]  torsemide (DEMADEX) 20 MG tablet Take 20 mg by mouth daily. 40mg  Am/ 20mg  PM   Yes [provider]  traZODone (DESYREL) 50 MG tablet Take 50 mg by mouth at bedtime as needed for sleep.  08/12/17  Yes [provider]  isosorbide mononitrate (IMDUR) 30 MG 24 hr tablet Take 1 tablet (30 mg total) by mouth daily. Patient not taking: Reported on 02/22/2019 11/10/18   Dustin Flock, MD  Multiple Vitamin (MULTI-VITAMINS) TABS Take 1 tablet by mouth daily.     [provider]  multivitamin-lutein (OCUVITE-LUTEIN) CAPS capsule Take 1 capsule by mouth daily. Patient not taking: Reported on 02/22/2019 01/09/18   Demetrios Loll, MD      Review of Systems  Constitutional: Positive for fatigue (easily). Negative for appetite change.  HENT: Negative for congestion, postnasal drip and sore throat.   Eyes: Negative.   Respiratory: Positive for shortness of breath (with minimal exertion). Negative for cough, chest tightness and wheezing.  Cardiovascular: Positive for leg swelling. Negative for chest pain and palpitations.  Gastrointestinal: Positive for abdominal distention. Negative for abdominal pain.  Endocrine: Negative.   Genitourinary: Negative.   Musculoskeletal: Positive for back pain. Negative for neck pain.       Has right lower leg prosthesis  Skin: Negative.   Allergic/Immunologic: Negative.   Neurological: Positive for light-headedness (at times). Negative for dizziness.  Hematological: Negative for adenopathy. Bruises/bleeds easily.  Psychiatric/Behavioral: Positive for sleep disturbance (sleeping on 3 pillows). Negative for dysphoric mood. The patient is not nervous/anxious.    Vitals:   02/22/19 1256  BP: 106/68  Pulse: (!) 105  Resp: 18  SpO2: 90%  Weight: 221 lb 8 oz (100.5 kg)  Height: 5\' 7"  (1.702 m)   Wt Readings from Last 3 Encounters:  02/22/19 221 lb 8 oz (100.5 kg)  01/19/19 217 lb (98.4 kg)  01/05/19 213 lb (96.6 kg)   Lab Results  Component Value Date   CREATININE 1.28 (H) 11/26/2018   CREATININE 1.12 11/25/2018   CREATININE 1.22 11/09/2018     Physical Exam  Constitutional: He is oriented to person, place, and time. He appears well-developed and well-nourished.  HENT:  Head: Normocephalic and atraumatic.  Neck: Normal range of motion. Neck supple. No JVD present.  Cardiovascular: Regular rhythm. Tachycardia present.  Pulmonary/Chest: Effort normal. He has wheezes in the right upper field, the right lower field, the left upper field and the left lower field. He has rhonchi in the right lower field and the left lower field. He has no rales.  Abdominal: Soft. He exhibits  distension. There is no abdominal tenderness.  Musculoskeletal:        General: Edema (3+ pitting edema in left lower leg to mid-thigh) present. No tenderness.  Neurological: He is alert and oriented to person, place, and time.  Skin: Skin is warm and dry.  Psychiatric: He has a normal mood and affect. His behavior is normal. Thought content normal.  Nursing note and vitals reviewed.   Assessment & Plan:  1: Acute on Chronic heart failure with reduced ejection fraction- - NYHA class III - moderately fluid overloaded today with pitting edema to mid-thigh - weighing daily and he was reminded to call for an overnight weight gain of >2 pounds or a weekly weight gain of >5 pounds - weight down 7 pounds from last visit here 3 months ago; down 2 lbs from PCP's visit on 02/16/2019 - will send for 80mg  IV lasix/ 44meq PO potassium tomorrow as patient says that he can't do it today - am concerned about most recent sodium level from 02/16/2019 being low but he needs the IV lasix - BP too low today to initiate entresto - patient currently taking torsemide 40mg  AM/ 20mg  PM along with 25mg  spironolactone - not adding salt and has been using Mrs. Dash & trying to read food labels.  - has [place] - drinking ~ 64 ounces of fluid daily (Mtn Dew, pepsi, water) - had telemedicine visit with cardiologist Rockey Situ) 12/14/2018 - BNP 02/16/2019 was 2252.0  2: HTN- - BP on the low side today today - saw PCP Vickki Muff) 02/16/2019 - BMP done 02/16/2019 reviewed and shows sodium 129, potassium 3.6, creatinine 1.1 and GFR 75  3: Diabetes- - glucose at home was 193 - A1c on 02/16/2019 was 7.0%  4: Tobacco use- - currently smoking <1/2 ppd of cigarettes - complete cessation discussed for 3 minutes with him  Medication bottles were reviewed.   Return  in 2 days for a recheck or sooner for any questions/problems before then.

## 2019-02-22 NOTE — Patient Instructions (Signed)
Continue weighing daily and call for an overnight weight gain of > 2 pounds or a weekly weight gain of >5 pounds. 

## 2019-02-23 ENCOUNTER — Other Ambulatory Visit: Payer: Self-pay

## 2019-02-23 ENCOUNTER — Ambulatory Visit
Admission: RE | Admit: 2019-02-23 | Discharge: 2019-02-23 | Disposition: A | Discharge status: Home / Self Care | Payer: Medicare Other | Source: Ambulatory Visit | Attending: Family | Admitting: Family

## 2019-02-23 DIAGNOSIS — I5023 Acute on chronic systolic (congestive) heart failure: Secondary | ICD-10-CM | POA: Diagnosis not present

## 2019-02-23 LAB — BASIC METABOLIC PANEL
Anion gap: 9 (ref 5–15)
BUN: 17 mg/dL (ref 6–20)
CO2: 31 mmol/L (ref 22–32)
Calcium: 8.7 mg/dL — ABNORMAL LOW (ref 8.9–10.3)
Chloride: 91 mmol/L — ABNORMAL LOW (ref 98–111)
Creatinine, Ser: 1.25 mg/dL — ABNORMAL HIGH (ref 0.61–1.24)
GFR calc Af Amer: >60 mL/min (ref >60)
GFR calc non Af Amer: >60 mL/min (ref >60)
Glucose, Bld: 159 mg/dL — ABNORMAL HIGH (ref 70–99)
Potassium: 4.1 mmol/L (ref 3.5–5.1)
Sodium: 131 mmol/L — ABNORMAL LOW (ref 135–145)

## 2019-02-23 LAB — GLUCOSE, CAPILLARY: Glucose-Capillary: 200 mg/dL — ABNORMAL HIGH (ref 70–99)

## 2019-02-23 LAB — BRAIN NATRIURETIC PEPTIDE: B Natriuretic Peptide: 1971 pg/mL — ABNORMAL HIGH (ref 0.0–100.0)

## 2019-02-23 MED ORDER — FUROSEMIDE 10 MG/ML IJ SOLN
80.0000 mg | Freq: Once | INTRAMUSCULAR | Status: AC
  Administered 2019-02-23: 11:00:00 80 mg via INTRAVENOUS

## 2019-02-23 MED ORDER — POTASSIUM CHLORIDE CRYS ER 20 MEQ PO TBCR
40.0000 meq | EXTENDED_RELEASE_TABLET | Freq: Once | ORAL | Status: AC
  Administered 2019-02-23: 11:00:00 40 meq via ORAL

## 2019-02-23 MED ORDER — SODIUM CHLORIDE FLUSH 0.9 % IV SOLN
INTRAVENOUS | Status: AC
  Administered 2019-02-23: 10 mL
  Filled 2019-02-23: qty 10

## 2019-02-23 MED ORDER — FUROSEMIDE 10 MG/ML IJ SOLN
INTRAMUSCULAR | Status: AC
  Administered 2019-02-23: 80 mg via INTRAVENOUS
  Filled 2019-02-23: qty 8

## 2019-02-23 MED ORDER — POTASSIUM CHLORIDE CRYS ER 20 MEQ PO TBCR
EXTENDED_RELEASE_TABLET | ORAL | Status: AC
  Administered 2019-02-23: 40 meq via ORAL
  Filled 2019-02-23: qty 2

## 2019-02-23 NOTE — OR Nursing (Signed)
Patent arrived via wheelchair for lab work, IV Lasix and PO potassium.  IV Lasix provided slowly without any complaints or reaction.  Waiting for blood work results prior to letting patient go home.

## 2019-02-24 ENCOUNTER — Ambulatory Visit: Payer: Medicare Other | Admitting: Family

## 2019-02-24 NOTE — Progress Notes (Deleted)
Patient ID: [name] [name], male    DOB: 03/09/1961, 58 y.o.   MRN: [id number]  HPI  [name] [name] is a 58 y/o male with a history of obstructive sleep apnea, PVD, PAD, HTN, hyperlipidemia, DM, CAD, COPD, BPH, asthma, current tobacco use and chronic heart failure.   Echo report from 11/06/2018 reviewed and showed an EF of 20-25% along with moderate TR and a PA pressure of 59.6 mmHg. Echo report from 04/25/17 reviewed and shows an EF of 25-30% along with a PA pressure of 34 mm Hg. EF has declined from 55-60% back in 2016.   Cardiac catheterization done 04/28/17 showed significant three-vessel disease with a patient stent in the RCA and LAD. Significant proximal RCS disease and the stent as well as diffuse mid and distal disease. LAD has moderate disease. Severely elevated left ventricular end-diastolic pressure at 34 mmHg. Possible CABG in the future with optimizing medical management. Stress done in 2015.  Admitted 11/05/2018 due to acute on chronic HF. Cardiology consult obtained. Aggressively diuresed and discharged after 4 days.   He presents today for a follow-up visit with a chief complaint of moderate shortness of breath upon minimal exertion. He describes this as chronic in nature having been present for several years although he does feel like it's worsened over the last week. He has associated fatigue, pedal edema, abdominal distention, light-headedness and difficulty sleeping along with this. He denies any palpitations, chest pain, wheezing, cough or weight gain.   Received 80 IV lasix, 40 PO Potassium chloride yesterday.    Past Medical History:  Diagnosis Date  . Arthritis   . Asthma   . Atherosclerosis   . BPH (benign prostatic hyperplasia)   . Carotid arterial disease ([place])   . Charcot's joint of foot, right   . Chronic combined systolic (congestive) and diastolic (congestive) heart failure ([place])    a. 02/2013 EF 50% by LV gram; b. 04/2017 Echo: EF 25-30%. diff HK. Gr2 DD. Mod dil  LA/RV. PASP 72mmHg.  [name] COPD (chronic obstructive pulmonary disease) ([place])   . Coronary artery disease    a. 2013 S/P PCI of LAD [facility]);  b. 03/2013 PCI: RCA 90p ( 2.5 x 23 mm DES); c. 02/2014 Cath: patent RCA stent->Med Rx; d. 04/2017 Cath: LM nl, LAd 30ost/p, 5m/d, D1 80ost, LCX 90p/m, OM1 85, RCA 70/20p, 21m, 70d->Referred for CT Surg-felt to be poor candidate.  . Diabetic neuropathy ([place])   . Diabetic ulcer of right foot ([place])   . Hernia   . Hyperlipidemia   . Hypertension   . Ischemic cardiomyopathy    a. 04/2017 Echo: EF 25-30%; b. 08/2017 Cardiac MRI ([facility]): EF 19%, sev glob HK. RVEF 20%, mod BAE, triv [name], mild-mod TR. Basal lateral subendocardial infarct (viable), inf/infsept, dist septal ischemia, basal to mid lat peri-infarct ischemia.  . Morbid obesity ([place])   . Neuropathy   . PAD (peripheral artery disease) ([place])    a. Followed by Dr. Lucky Cowboy; b. 08/2010 Periph Angio: RSFA 70-80p (6X60 self-expanding stent); c. 09/2010 Periph Angio: L SFA 100p (7x unknown length self-expanding stent); d. 06/2015 Periph Angio: R SFA short segment occlusion (6x12 self-expanding stent).  . Restless leg syndrome   . Sleep apnea   . Subclavian artery stenosis, right ([place])   . Syncope and collapse   . Tobacco use    a. 75+ yr hx - still smoking 1ppd, down from 2 ppd.  . Type II diabetes mellitus ([place])   . Varicose vein  Past Surgical History:  Procedure Laterality Date  . ABDOMINAL AORTAGRAM N/A 06/23/2013   Procedure: ABDOMINAL Maxcine Ham;  Surgeon: Wellington Hampshire, MD;  Location: [place] CATH LAB;  Service: Cardiovascular;  Laterality: N/A;  . AMPUTATION Right 04/01/2018   Procedure: AMPUTATION BELOW KNEE;  Surgeon: Algernon Huxley, MD;  Location: [facility] ORS;  Service: Vascular;  Laterality: Right;  . APPENDECTOMY    . CARDIAC CATHETERIZATION  10/14   [facility]; X1 STENT PROXIMAL RCA  . CARDIAC CATHETERIZATION  02/2010   [facility]  . CARD[facility]AC [facility]ATION  [facility]  . CLAVICLE SURGERY    . HERNIA REPAIR     . IRRIGATION AND DEBRIDEMENT FOOT Right 01/04/2018   Procedure: IRRIGATION AND DEBRIDEMENT FOOT;  Surgeon: Samara Deist, DPM;  Location: [facility] ORS;  Service: Podiatry;  Laterality: Right;  . LEFT HEART CATH AND CORS/GRAFTS ANGIOGRAPHY N/A 04/28/2017   Procedure: LEFT HEART CATH AND CORONARY ANGIOGRAPHY;  Surgeon: Wellington Hampshire, MD;  Location: [place] CV LAB;  Service: Cardiovascular;  Laterality: N/A;  . LOWER EXTREMITY ANGIOGRAPHY Right 03/03/2018   Procedure: LOWER EXTREMITY ANGIOGRAPHY;  Surgeon: Katha Cabal, MD;  Location: [place] CV LAB;  Service: Cardiovascular;  Laterality: Right;  . LOWER EXTREMITY ANGIOGRAPHY Left 08/24/2018   Procedure: LOWER EXTREMITY ANGIOGRAPHY;  Surgeon: Algernon Huxley, MD;  Location: [place] CV LAB;  Service: Cardiovascular;  Laterality: Left;  . PERIPHERAL ARTERIAL STENT GRAFT     x2 left/right  . PERIPHERAL VASCULAR BALLOON ANGIOPLASTY Right 01/06/2018   Procedure: PERIPHERAL VASCULAR BALLOON ANGIOPLASTY;  Surgeon: Katha Cabal, MD;  Location: [place] CV LAB;  Service: Cardiovascular;  Laterality: Right;  . PERIPHERAL VASCULAR CATHETERIZATION N/A 07/06/2015   Procedure: Abdominal Aortogram w/Lower Extremity;  Surgeon: Algernon Huxley, MD;  Location: [place] CV LAB;  Service: Cardiovascular;  Laterality: N/A;  . PERIPHERAL VASCULAR CATHETERIZATION  07/06/2015   Procedure: Lower Extremity Intervention;  Surgeon: Algernon Huxley, MD;  Location: [place] CV LAB;  Service: Cardiovascular;;   Family History  Problem Relation Age of Onset  . Heart attack Father   . Heart disease Father   . Hypertension Father   . Hyperlipidemia Father   . Hypertension Mother   . Hyperlipidemia Mother    Social History   Tobacco Use  . Smoking status: Current Every Day Smoker    Packs/day: 1.00    Years: 41.00    Pack years: 41.00    Types: Cigarettes  . Smokeless tobacco: Never Used  Substance Use Topics  . Alcohol use: No    Allergies  Allergen Reactions  . Dulaglutide Anaphylaxis, Diarrhea and Hives  . Other Itching    Skin itching associated with nitro patch  . Prednisone Rash     Review of Systems  Constitutional: Positive for fatigue (easily). Negative for appetite change.  HENT: Negative for congestion, postnasal drip and sore throat.   Eyes: Negative.   Respiratory: Positive for shortness of breath (with minimal exertion). Negative for cough, chest tightness and wheezing.   Cardiovascular: Positive for leg swelling. Negative for chest pain and palpitations.  Gastrointestinal: Positive for abdominal distention. Negative for abdominal pain.  Endocrine: Negative.   Genitourinary: Negative.   Musculoskeletal: Positive for back pain. Negative for neck pain.       Has right lower leg prosthesis  Skin: Negative.   Allergic/Immunologic: Negative.   Neurological: Positive for light-headedness (at times). Negative for dizziness.  Hematological: Negative for adenopathy. Bruises/bleeds easily.  Psychiatric/Behavioral: Positive for  sleep disturbance (sleeping on 3 pillows). Negative for dysphoric mood. The patient is not nervous/anxious.      Physical Exam  Constitutional: He is oriented to person, place, and time. He appears well-developed and well-nourished.  HENT:  Head: Normocephalic and atraumatic.  Neck: Normal range of motion. Neck supple. No JVD present.  Cardiovascular: Regular rhythm. Tachycardia present.  Pulmonary/Chest: Effort normal. He has wheezes in the right upper field, the right lower field, the left upper field and the left lower field. He has rhonchi in the right lower field and the left lower field. He has no rales.  Abdominal: Soft. He exhibits distension. There is no abdominal tenderness.  Musculoskeletal:        General: Edema (3+ pitting edema in left lower leg to mid-thigh) present. No tenderness.  Neurological: He is alert and oriented to person, place, and time.  Skin:  Skin is warm and dry.  Psychiatric: He has a normal mood and affect. His behavior is normal. Thought content normal.  Nursing note and vitals reviewed.   Assessment & Plan:  1: Acute on Chronic heart failure with reduced ejection fraction- - NYHA class III - moderately fluid overloaded today with pitting edema to mid-thigh - weighing daily and he was reminded to call for an overnight weight gain of >2 pounds or a weekly weight gain of >5 pounds - weight down 7 pounds from last visit here 3 months ago; down 2 lbs from PCP's visit on 02/16/2019 - will send for 80mg  IV lasix/ 28meq PO potassium tomorrow as patient says that he can't do it today - am concerned about most recent sodium level from 02/16/2019 being low but he needs the IV lasix - BP too low today to initiate entresto - patient currently taking torsemide 40mg  AM/ 20mg  PM along with 25mg  spironolactone - not adding salt and has been using Mrs. Dash & trying to read food labels.  - has [place] - drinking ~ 64 ounces of fluid daily (Mtn Dew, pepsi, water) - had telemedicine visit with cardiologist Rockey Situ) 12/14/2018 - BNP 02/16/2019 was 2252.0  2: HTN- - BP on the low side today today - saw PCP Vickki Muff) 02/16/2019 - BMP done 02/16/2019 reviewed and shows sodium 129, potassium 3.6, creatinine 1.1 and GFR 75  3: Diabetes- - glucose at home was 193 - A1c on 02/16/2019 was 7.0%  4: Tobacco use- - currently smoking <1/2 ppd of cigarettes - complete cessation discussed for 3 minutes with him  Medication bottles were reviewed.   Return in 2 days for a recheck or sooner for any questions/problems before then.

## 2019-02-24 NOTE — Progress Notes (Signed)
Patient ID: [name] [name], male    DOB: 1961/04/25, 58 y.o.   MRN: [id number]  HPI  Mr Pizzolato is a 58 y/o male with a history of obstructive sleep apnea, PVD, PAD, HTN, hyperlipidemia, DM, CAD, COPD, BPH, asthma, current tobacco use and chronic heart failure.   Echo report from 11/06/2018 reviewed and showed an EF of 20-25% along with moderate TR and a PA pressure of 59.6 mmHg. Echo report from 04/25/17 reviewed and shows an EF of 25-30% along with a PA pressure of 34 mm Hg. EF has declined from 55-60% back in 2016.   Cardiac catheterization done 04/28/17 showed significant three-vessel disease with a patient stent in the RCA and LAD. Significant proximal RCS disease and the stent as well as diffuse mid and distal disease. LAD has moderate disease. Severely elevated left ventricular end-diastolic pressure at 34 mmHg. Possible CABG in the future with optimizing medical management. Stress done in 2015.  Admitted 11/05/2018 due to acute on chronic HF. Cardiology consult obtained. Aggressively diuresed and discharged after 4 days.   He presents today for a follow-up visit with a chief complaint of moderate shortness of breath upon minimal exertion. He describes this as chronic in nature. He does feel like his breathing has improved a little bit over the last few days. He has associated fatigue, pedal edema, difficulty sleeping and redness in his left lower leg. He denies any dizziness, abdominal distention, palpitations, chest pain, wheezing, cough or weight gain.    Received 80mg  IV lasix/ 51meq PO potassium 3 days ago.    Past Medical History:  Diagnosis Date  . Arthritis   . Asthma   . Atherosclerosis   . BPH (benign prostatic hyperplasia)   . Carotid arterial disease ([place])   . Charcot's joint of foot, right   . Chronic combined systolic (congestive) and diastolic (congestive) heart failure ([place])    a. 02/2013 EF 50% by LV gram; b. 04/2017 Echo: EF 25-30%. diff HK. Gr2 DD. Mod dil LA/RV. PASP  60mmHg.  [name] COPD (chronic obstructive pulmonary disease) ([place])   . Coronary artery disease    a. 2013 S/P PCI of LAD [facility]);  b. 03/2013 PCI: RCA 90p ( 2.5 x 23 mm DES); c. 02/2014 Cath: patent RCA stent->Med Rx; d. 04/2017 Cath: LM nl, LAd 30ost/p, 69m/d, D1 80ost, LCX 90p/m, OM1 85, RCA 70/20p, 26m, 70d->Referred for CT Surg-felt to be poor candidate.  . Diabetic neuropathy ([place])   . Diabetic ulcer of right foot ([place])   . Hernia   . Hyperlipidemia   . Hypertension   . Ischemic cardiomyopathy    a. 04/2017 Echo: EF 25-30%; b. 08/2017 Cardiac MRI ([facility]): EF 19%, sev glob HK. RVEF 20%, mod BAE, triv MR, mild-mod TR. Basal lateral subendocardial infarct (viable), inf/infsept, dist septal ischemia, basal to mid lat peri-infarct ischemia.  . Morbid obesity ([place])   . Neuropathy   . PAD (peripheral artery disease) ([place])    a. Followed by Dr. Lucky Cowboy; b. 08/2010 Periph Angio: RSFA 70-80p (6X60 self-expanding stent); c. 09/2010 Periph Angio: L SFA 100p (7x unknown length self-expanding stent); d. 06/2015 Periph Angio: R SFA short segment occlusion (6x12 self-expanding stent).  . Restless leg syndrome   . Sleep apnea   . Subclavian artery stenosis, right ([place])   . Syncope and collapse   . Tobacco use    a. 75+ yr hx - still smoking 1ppd, down from 2 ppd.  . Type II diabetes mellitus ([place])   .  Varicose vein    Past Surgical History:  Procedure Laterality Date  . ABDOMINAL AORTAGRAM N/A 06/23/2013   Procedure: ABDOMINAL Maxcine Ham;  Surgeon: Wellington Hampshire, MD;  Location: [place] CATH LAB;  Service: Cardiovascular;  Laterality: N/A;  . AMPUTATION Right 04/01/2018   Procedure: AMPUTATION BELOW KNEE;  Surgeon: Algernon Huxley, MD;  Location: [facility] ORS;  Service: Vascular;  Laterality: Right;  . APPENDECTOMY    . CARDIAC CATHETERIZATION  10/14   [facility]; X1 STENT PROXIMAL RCA  . CARDIAC CATHETERIZATION  02/2010   [facility]  . CARDIAC CATHETERIZATION  02/21/2014   [facility]  . CLAVICLE SURGERY    . HERNIA REPAIR    .  IRRIGATION AND DEBRIDEMENT FOOT Right 01/04/2018   Procedure: IRRIGATION AND DEBRIDEMENT FOOT;  Surgeon: Samara Deist, DPM;  Location: [facility] ORS;  Service: Podiatry;  Laterality: Right;  . LEFT HEART CATH AND CORS/GRAFTS ANGIOGRAPHY N/A 04/28/2017   Procedure: LEFT HEART CATH AND CORONARY ANGIOGRAPHY;  Surgeon: Wellington Hampshire, MD;  Location: [place] CV LAB;  Service: Cardiovascular;  Laterality: N/A;  . LOWER EXTREMITY ANGIOGRAPHY Right 03/03/2018   Procedure: LOWER EXTREMITY ANGIOGRAPHY;  Surgeon: Katha Cabal, MD;  Location: [place] CV LAB;  Service: Cardiovascular;  Laterality: Right;  . LOWER EXTREMITY ANGIOGRAPHY Left 08/24/2018   Procedure: LOWER EXTREMITY ANGIOGRAPHY;  Surgeon: Algernon Huxley, MD;  Location: [place] CV LAB;  Service: Cardiovascular;  Laterality: Left;  . PERIPHERAL ARTERIAL STENT GRAFT     x2 left/right  . PERIPHERAL VASCULAR BALLOON ANGIOPLASTY Right 01/06/2018   Procedure: PERIPHERAL VASCULAR BALLOON ANGIOPLASTY;  Surgeon: Katha Cabal, MD;  Location: [place] CV LAB;  Service: Cardiovascular;  Laterality: Right;  . PERIPHERAL VASCULAR CATHETERIZATION N/A 07/06/2015   Procedure: Abdominal Aortogram w/Lower Extremity;  Surgeon: Algernon Huxley, MD;  Location: [place] CV LAB;  Service: Cardiovascular;  Laterality: N/A;  . PERIPHERAL VASCULAR CATHETERIZATION  07/06/2015   Procedure: Lower Extremity Intervention;  Surgeon: Algernon Huxley, MD;  Location: [place] CV LAB;  Service: Cardiovascular;;   Family History  Problem Relation Age of Onset  . Heart attack Father   . Heart disease Father   . Hypertension Father   . Hyperlipidemia Father   . Hypertension Mother   . Hyperlipidemia Mother    Social History   Tobacco Use  . Smoking status: Current Every Day Smoker    Packs/day: 1.00    Years: 41.00    Pack years: 41.00    Types: Cigarettes  . Smokeless tobacco: Never Used  Substance Use Topics  . Alcohol use: No   Allergies   Allergen Reactions  . Dulaglutide Anaphylaxis, Diarrhea and Hives  . Other Itching    Skin itching associated with nitro patch  . Prednisone Rash   Prior to Admission medications   Medication Sig Start Date End Date Taking? Authorizing Provider  albuterol (VENTOLIN HFA) 108 (90 Base) MCG/ACT inhaler Inhale into the lungs every 6 (six) hours as needed for wheezing or shortness of breath.   Yes [provider]  aspirin EC 81 MG tablet Take 1 tablet (81 mg total) by mouth daily. 05/11/15  Yes Wellington Hampshire, MD  atorvastatin (LIPITOR) 80 MG tablet Take 80 mg by mouth daily.   Yes [provider]  calcium carbonate (TUMS - DOSED IN MG ELEMENTAL CALCIUM) 500 MG chewable tablet Chew 4 tablets by mouth daily as needed for indigestion or heartburn.   Yes [provider]  clopidogrel (PLAVIX) 75 MG  tablet Take 75 mg by mouth daily.  08/22/17  Yes [provider]  diphenhydrAMINE (BENADRYL) 25 MG tablet Take 25 mg by mouth daily as needed for allergies.   Yes [provider]  ezetimibe (ZETIA) 10 MG tablet Take 10 mg by mouth daily.   Yes [provider]  gabapentin (NEURONTIN) 800 MG tablet Take 800 mg by mouth 4 (four) times daily.  08/14/17 02/25/19 Yes [provider]  Insulin Glargine (LANTUS SOLOSTAR) 100 UNIT/ML Solostar Pen Inject 30 Units into the skin at bedtime. 04/07/18  Yes Mayo, Pete Pelt, MD  insulin lispro (HUMALOG) 100 UNIT/ML injection Sliding Scale Insulin before meals using Novolog If BS is 0 to 70 no insulin and eat/glucose tablets/glucose get If BS 71 to 150 great! If BS 151 to 200, 2 Units Novolog If BS 201 to 250, 4 Units Novolog If BS 251 to 300, 6 Units Novolog If BS 301 to 350, 8 Units Novolog If BS 351 to 400, 10 Units Novolog If BS greater than 400, take 10 Units and Contact our office during office hour 04/14/18  Yes [provider]  ipratropium-albuterol (DUONEB) 0.5-2.5 (3) MG/3ML SOLN Inhale 3 mLs into the  lungs every 6 (six) hours as needed (wheezing/sob).    Yes [provider]  isosorbide mononitrate (IMDUR) 30 MG 24 hr tablet Take 1 tablet (30 mg total) by mouth daily. 11/10/18  Yes Dustin Flock, MD  metFORMIN (GLUCOPHAGE-XR) 500 MG 24 hr tablet Take 1,000 mg by mouth 2 (two) times daily.    Yes [provider]  metoprolol succinate (TOPROL-XL) 25 MG 24 hr tablet Take 12.5 mg by mouth daily.  03/19/18 03/19/19 Yes [provider]  Multiple Vitamin (MULTI-VITAMINS) TABS Take 1 tablet by mouth daily.    Yes [provider]  multivitamin-lutein (OCUVITE-LUTEIN) CAPS capsule Take 1 capsule by mouth daily. 01/09/18  Yes Demetrios Loll, MD  nitroGLYCERIN (NITRODUR - DOSED IN MG/24 HR) 0.4 mg/hr patch Place 0.4 mg onto the skin daily.   Yes [provider]  nitroGLYCERIN (NITROSTAT) 0.4 MG SL tablet Place 0.4 mg under the tongue every 5 (five) minutes as needed for chest pain.   Yes [provider]  Oxycodone HCl 10 MG TABS Take 10 mg by mouth every 6 (six) hours as needed (pain).  01/15/18  Yes [provider]  potassium chloride (K-DUR) 10 MEQ tablet Take 10 mEq by mouth daily.   Yes [provider]  promethazine (PHENERGAN) 25 MG tablet TAKE 1 TABLET BY MOUTH EVERY 6 HOURS AS NEEDED NAUSEA 04/29/18  Yes [provider]  spironolactone (ALDACTONE) 25 MG tablet Take 25 mg by mouth daily.  03/02/18  Yes [provider]  torsemide (DEMADEX) 20 MG tablet Take by mouth daily. Patient taking 40mg  Am/ 20mg  PM   Yes [provider]  traZODone (DESYREL) 50 MG tablet Take 50 mg by mouth at bedtime as needed for sleep.  08/12/17  Yes [provider]    Review of Systems  Constitutional: Positive for fatigue (easily). Negative for appetite change.  HENT: Negative for congestion, postnasal drip and sore throat.   Eyes: Negative.   Respiratory: Positive for shortness of breath (with minimal exertion). Negative for  cough, chest tightness and wheezing.   Cardiovascular: Positive for leg swelling (better). Negative for chest pain and palpitations.  Gastrointestinal: Negative for abdominal distention and abdominal pain.  Endocrine: Negative.   Genitourinary: Positive for frequency.  Musculoskeletal: Positive for back pain. Negative for neck  pain.       Has right lower leg prosthesis  Skin: Negative.   Allergic/Immunologic: Negative.   Neurological: Negative for dizziness and light-headedness.  Hematological: Negative for adenopathy. Bruises/bleeds easily.  Psychiatric/Behavioral: Positive for sleep disturbance (sleeping on 3 pillows). Negative for dysphoric mood. The patient is not nervous/anxious.    Vitals:   02/25/19 1101  BP: 117/73  Pulse: (!) 107  Resp: 18  SpO2: 95%  Weight: 218 lb 8 oz (99.1 kg)  Height: 5\' 7"  (1.702 m)   Wt Readings from Last 3 Encounters:  02/25/19 218 lb 8 oz (99.1 kg)  02/22/19 221 lb 8 oz (100.5 kg)  01/19/19 217 lb (98.4 kg)   Lab Results  Component Value Date   CREATININE 1.25 (H) 02/23/2019   CREATININE 1.28 (H) 11/26/2018   CREATININE 1.12 11/25/2018     Physical Exam  Constitutional: He is oriented to person, place, and time. He appears well-developed and well-nourished.  HENT:  Head: Normocephalic and atraumatic.  Neck: Normal range of motion. Neck supple. No JVD present.  Cardiovascular: Regular rhythm. Tachycardia present.  Pulmonary/Chest: Effort normal. He has no wheezes. He has no rhonchi. He has rales in the left upper field and the left lower field.  Abdominal: Soft. He exhibits distension. There is no abdominal tenderness.  Musculoskeletal:        General: Edema (3+ pitting edema in left lower leg to mid-thigh) present. No tenderness.  Neurological: He is alert and oriented to person, place, and time.  Skin: Skin is warm and dry. There is erythema (left lateral lower leg).  Psychiatric: He has a normal mood and affect. His behavior is  normal. Thought content normal.  Nursing note and vitals reviewed.   Assessment & Plan:  1: Acute on Chronic heart failure with reduced ejection fraction- - NYHA class III - continued moderately fluid overloaded today with pitting edema to mid-thigh - weighing daily and he was reminded to call for an overnight weight gain of >2 pounds or a weekly weight gain of >5 pounds - weight down 3 pounds from last visit here 3 days ago - received 80mg  IV lasix/ 73meq PO potassium 3 days ago; will repeat this tomorrow at 10am as that worked better for the patient - BP too low today to initiate entresto - patient currently taking torsemide 40mg  AM/ 20mg  PM along with 25mg  spironolactone - not adding salt and has been using Mrs. Dash & trying to read food labels.  - has [place] - drinking ~ 64 ounces of fluid daily (Mtn Dew, pepsi, water) - had telemedicine visit with cardiologist Rockey Situ) 12/14/2018 - BNP 02/23/2019 was 1971.0  2: HTN- - BP looks good today - saw PCP Vickki Muff) 02/16/2019 - BMP done 02/23/2019 reviewed and shows sodium 131, potassium 4.1, creatinine 1.25 and GFR >60  3: Cellulitis- - left lateral lower leg has shiny redness along with feeling of warmth - will give keflex 500mg  BID for 5 days  4: Tobacco use- - currently smoking <1/2 ppd of cigarettes - complete cessation discussed for 3 minutes with him  Patient did not bring his medications nor a list. Each medication was verbally reviewed with the patient and he was encouraged to bring the bottles to every visit to confirm accuracy of list.  Will have patient return on 03/01/2019 due to getting IV lasix tomorrow and then the weekend is here.

## 2019-02-25 ENCOUNTER — Encounter: Payer: Self-pay | Admitting: Family

## 2019-02-25 ENCOUNTER — Ambulatory Visit: Payer: Medicare Other | Attending: Family | Admitting: Family

## 2019-02-25 ENCOUNTER — Other Ambulatory Visit: Payer: Self-pay

## 2019-02-25 ENCOUNTER — Other Ambulatory Visit: Payer: Self-pay | Admitting: Family

## 2019-02-25 VITALS — BP 117/73 | HR 107 | Resp 18 | Ht 67.0 in | Wt 218.5 lb

## 2019-02-25 DIAGNOSIS — F17210 Nicotine dependence, cigarettes, uncomplicated: Secondary | ICD-10-CM | POA: Diagnosis not present

## 2019-02-25 DIAGNOSIS — E1140 Type 2 diabetes mellitus with diabetic neuropathy, unspecified: Secondary | ICD-10-CM | POA: Insufficient documentation

## 2019-02-25 DIAGNOSIS — Z7902 Long term (current) use of antithrombotics/antiplatelets: Secondary | ICD-10-CM | POA: Insufficient documentation

## 2019-02-25 DIAGNOSIS — Z955 Presence of coronary angioplasty implant and graft: Secondary | ICD-10-CM | POA: Insufficient documentation

## 2019-02-25 DIAGNOSIS — Z8249 Family history of ischemic heart disease and other diseases of the circulatory system: Secondary | ICD-10-CM | POA: Diagnosis not present

## 2019-02-25 DIAGNOSIS — N400 Enlarged prostate without lower urinary tract symptoms: Secondary | ICD-10-CM | POA: Diagnosis not present

## 2019-02-25 DIAGNOSIS — E11621 Type 2 diabetes mellitus with foot ulcer: Secondary | ICD-10-CM | POA: Insufficient documentation

## 2019-02-25 DIAGNOSIS — M1990 Unspecified osteoarthritis, unspecified site: Secondary | ICD-10-CM | POA: Diagnosis not present

## 2019-02-25 DIAGNOSIS — Z794 Long term (current) use of insulin: Secondary | ICD-10-CM | POA: Diagnosis not present

## 2019-02-25 DIAGNOSIS — I2510 Atherosclerotic heart disease of native coronary artery without angina pectoris: Secondary | ICD-10-CM | POA: Diagnosis not present

## 2019-02-25 DIAGNOSIS — I5042 Chronic combined systolic (congestive) and diastolic (congestive) heart failure: Secondary | ICD-10-CM | POA: Diagnosis present

## 2019-02-25 DIAGNOSIS — G4733 Obstructive sleep apnea (adult) (pediatric): Secondary | ICD-10-CM | POA: Diagnosis not present

## 2019-02-25 DIAGNOSIS — E1151 Type 2 diabetes mellitus with diabetic peripheral angiopathy without gangrene: Secondary | ICD-10-CM | POA: Diagnosis not present

## 2019-02-25 DIAGNOSIS — I255 Ischemic cardiomyopathy: Secondary | ICD-10-CM | POA: Diagnosis not present

## 2019-02-25 DIAGNOSIS — J449 Chronic obstructive pulmonary disease, unspecified: Secondary | ICD-10-CM | POA: Insufficient documentation

## 2019-02-25 DIAGNOSIS — G2581 Restless legs syndrome: Secondary | ICD-10-CM | POA: Diagnosis not present

## 2019-02-25 DIAGNOSIS — L03116 Cellulitis of left lower limb: Secondary | ICD-10-CM | POA: Diagnosis not present

## 2019-02-25 DIAGNOSIS — L97519 Non-pressure chronic ulcer of other part of right foot with unspecified severity: Secondary | ICD-10-CM | POA: Insufficient documentation

## 2019-02-25 DIAGNOSIS — Z888 Allergy status to other drugs, medicaments and biological substances status: Secondary | ICD-10-CM | POA: Insufficient documentation

## 2019-02-25 DIAGNOSIS — E785 Hyperlipidemia, unspecified: Secondary | ICD-10-CM | POA: Insufficient documentation

## 2019-02-25 DIAGNOSIS — I10 Essential (primary) hypertension: Secondary | ICD-10-CM

## 2019-02-25 DIAGNOSIS — I5023 Acute on chronic systolic (congestive) heart failure: Secondary | ICD-10-CM

## 2019-02-25 DIAGNOSIS — Z7982 Long term (current) use of aspirin: Secondary | ICD-10-CM | POA: Diagnosis not present

## 2019-02-25 DIAGNOSIS — Z89511 Acquired absence of right leg below knee: Secondary | ICD-10-CM | POA: Diagnosis not present

## 2019-02-25 DIAGNOSIS — I110 Hypertensive heart disease with heart failure: Secondary | ICD-10-CM | POA: Insufficient documentation

## 2019-02-25 DIAGNOSIS — Z720 Tobacco use: Secondary | ICD-10-CM

## 2019-02-25 DIAGNOSIS — Z8349 Family history of other endocrine, nutritional and metabolic diseases: Secondary | ICD-10-CM | POA: Insufficient documentation

## 2019-02-25 DIAGNOSIS — Z79899 Other long term (current) drug therapy: Secondary | ICD-10-CM | POA: Insufficient documentation

## 2019-02-25 MED ORDER — CEPHALEXIN 500 MG PO CAPS: 500.0000 mg | ORAL_CAPSULE | Freq: Two times a day (BID) | ORAL | 3 refills | Status: DC

## 2019-02-25 NOTE — Patient Instructions (Signed)
Continue weighing daily and call for an overnight weight gain of > 2 pounds or a weekly weight gain of >5 pounds. 

## 2019-02-26 ENCOUNTER — Other Ambulatory Visit: Payer: Self-pay

## 2019-02-26 ENCOUNTER — Ambulatory Visit
Admission: RE | Admit: 2019-02-26 | Discharge: 2019-02-26 | Disposition: A | Discharge status: Home / Self Care | Payer: Medicare Other | Source: Ambulatory Visit | Attending: Family | Admitting: Family

## 2019-02-26 DIAGNOSIS — I5023 Acute on chronic systolic (congestive) heart failure: Secondary | ICD-10-CM | POA: Insufficient documentation

## 2019-02-26 LAB — BASIC METABOLIC PANEL
Anion gap: 11 (ref 5–15)
BUN: 13 mg/dL (ref 6–20)
CO2: 28 mmol/L (ref 22–32)
Calcium: 8.8 mg/dL — ABNORMAL LOW (ref 8.9–10.3)
Chloride: 92 mmol/L — ABNORMAL LOW (ref 98–111)
Creatinine, Ser: 0.97 mg/dL (ref 0.61–1.24)
GFR calc Af Amer: >60 mL/min (ref >60)
GFR calc non Af Amer: >60 mL/min (ref >60)
Glucose, Bld: 166 mg/dL — ABNORMAL HIGH (ref 70–99)
Potassium: 4.3 mmol/L (ref 3.5–5.1)
Sodium: 131 mmol/L — ABNORMAL LOW (ref 135–145)

## 2019-02-26 LAB — BRAIN NATRIURETIC PEPTIDE: B Natriuretic Peptide: 2261 pg/mL — ABNORMAL HIGH (ref 0.0–100.0)

## 2019-02-26 MED ORDER — FUROSEMIDE 10 MG/ML IJ SOLN
80.0000 mg | Freq: Once | INTRAMUSCULAR | Status: AC
  Administered 2019-02-26: 12:00:00 80 mg via INTRAVENOUS

## 2019-02-26 MED ORDER — POTASSIUM CHLORIDE CRYS ER 20 MEQ PO TBCR
EXTENDED_RELEASE_TABLET | ORAL | Status: AC
  Administered 2019-02-26: 40 meq via ORAL
  Filled 2019-02-26: qty 2

## 2019-02-26 MED ORDER — POTASSIUM CHLORIDE CRYS ER 20 MEQ PO TBCR
40.0000 meq | EXTENDED_RELEASE_TABLET | Freq: Once | ORAL | Status: AC
  Administered 2019-02-26: 12:00:00 40 meq via ORAL

## 2019-02-26 MED ORDER — SODIUM CHLORIDE FLUSH 0.9 % IV SOLN
INTRAVENOUS | Status: AC
  Administered 2019-02-26: 10 mL
  Filled 2019-02-26: qty 20

## 2019-02-26 MED ORDER — FUROSEMIDE 10 MG/ML IJ SOLN
INTRAMUSCULAR | Status: AC
  Administered 2019-02-26: 80 mg via INTRAVENOUS
  Filled 2019-02-26: qty 8

## 2019-02-27 NOTE — Progress Notes (Signed)
Patient ID: [name] [name], male    DOB: 01-19-1961, 58 y.o.   MRN: [id number]  HPI  [name] [name] is a 58 y/o male with a history of obstructive sleep apnea, PVD, PAD, HTN, hyperlipidemia, DM, CAD, COPD, BPH, asthma, current tobacco use and chronic heart failure.   Echo report from 11/06/2018 reviewed and showed an EF of 20-25% along with moderate TR and a PA pressure of 59.6 mmHg. Echo report from 04/25/17 reviewed and shows an EF of 25-30% along with a PA pressure of 34 mm Hg. EF has declined from 55-60% back in 2016.   Cardiac catheterization done 04/28/17 showed significant three-vessel disease with a patient stent in the RCA and LAD. Significant proximal RCS disease and the stent as well as diffuse mid and distal disease. LAD has moderate disease. Severely elevated left ventricular end-diastolic pressure at 34 mmHg. Possible CABG in the future with optimizing medical management. Stress done in 2015.  Admitted 11/05/2018 due to acute on chronic HF. Cardiology consult obtained. Aggressively diuresed and discharged after 4 days.   He presents today for a follow-up visit with a chief complaint of moderate shortness of breath upon minimal exertion. He describes this as chronic in nature having been present for several years. He has associated fatigue, pedal edema (improving), chronic pain and difficulty sleeping along with this. He denies any dizziness, abdominal distention, palpitations, chest pain, cough or weight gain.     Received 80mg  IV lasix/ 60meq PO potassium 3 days ago and has noticed that his weight has declined. .    Past Medical History:  Diagnosis Date  . Arthritis   . Asthma   . Atherosclerosis   . BPH (benign prostatic hyperplasia)   . Carotid arterial disease ([place])   . Charcot's joint of foot, right   . Chronic combined systolic (congestive) and diastolic (congestive) heart failure ([place])    a. 02/2013 EF 50% by LV gram; b. 04/2017 Echo: EF 25-30%. diff HK. Gr2 DD. Mod dil LA/RV.  PASP 105mmHg.  [name] COPD (chronic obstructive pulmonary disease) ([place])   . Coronary artery disease    a. 2013 S/P PCI of LAD [facility]);  b. 03/2013 PCI: RCA 90p ( 2.5 x 23 mm DES); c. 02/2014 Cath: patent RCA stent->Med Rx; d. 04/2017 Cath: LM nl, LAd 30ost/p, 53m/d, D1 80ost, LCX 90p/m, OM1 85, RCA 70/20p, 83m, 70d->Referred for CT Surg-felt to be poor candidate.  . Diabetic neuropathy ([place])   . Diabetic ulcer of right foot ([place])   . Hernia   . Hyperlipidemia   . Hypertension   . Ischemic cardiomyopathy    a. 04/2017 Echo: EF 25-30%; b. 08/2017 Cardiac MRI ([facility]): EF 19%, sev glob HK. RVEF 20%, mod BAE, triv [name], mild-mod TR. Basal lateral subendocardial infarct (viable), inf/infsept, dist septal ischemia, basal to mid lat peri-infarct ischemia.  . Morbid obesity ([place])   . Neuropathy   . PAD (peripheral artery disease) ([place])    a. Followed by Dr. Lucky Cowboy; b. 08/2010 Periph Angio: RSFA 70-80p (6X60 self-expanding stent); c. 09/2010 Periph Angio: L SFA 100p (7x unknown length self-expanding stent); d. 06/2015 Periph Angio: R SFA short segment occlusion (6x12 self-expanding stent).  . Restless leg syndrome   . Sleep apnea   . Subclavian artery stenosis, right ([place])   . Syncope and collapse   . Tobacco use    a. 75+ yr hx - still smoking 1ppd, down from 2 ppd.  . Type II diabetes mellitus ([name])   . Varicose  vein    Past Surgical History:  Procedure Laterality Date  . ABDOMINAL AORTAGRAM N/A 06/23/2013   Procedure: ABDOMINAL Maxcine Ham;  Surgeon: Wellington Hampshire, MD;  Location: [place] CATH LAB;  Service: Cardiovascular;  Laterality: N/A;  . AMPUTATION Right 04/01/2018   Procedure: AMPUTATION BELOW KNEE;  Surgeon: Algernon Huxley, MD;  Location: [facility] ORS;  Service: Vascular;  Laterality: Right;  . APPENDECTOMY    . CARDIAC CATHETERIZATION  10/14   [facility]; X1 STENT PROXIMAL RCA  . CARDIAC CATHETERIZATION  02/2010   [facility]  . CARDIAC CATHETERIZATION  02/21/2014   [facility]  . CLAVICLE SURGERY    . HERNIA REPAIR    .  IRRIGATION AND DEBRIDEMENT FOOT Right 01/04/2018   Procedure: IRRIGATION AND DEBRIDEMENT FOOT;  Surgeon: Samara Deist, DPM;  Location: [facility] ORS;  Service: Podiatry;  Laterality: Right;  . LEFT HEART CATH AND CORS/GRAFTS ANGIOGRAPHY N/A 04/28/2017   Procedure: LEFT HEART CATH AND CORONARY ANGIOGRAPHY;  Surgeon: Wellington Hampshire, MD;  Location: [place] CV LAB;  Service: Cardiovascular;  Laterality: N/A;  . LOWER EXTREMITY ANGIOGRAPHY Right 03/03/2018   Procedure: LOWER EXTREMITY ANGIOGRAPHY;  Surgeon: Katha Cabal, MD;  Location: [place] CV LAB;  Service: Cardiovascular;  Laterality: Right;  . LOWER EXTREMITY ANGIOGRAPHY Left 08/24/2018   Procedure: LOWER EXTREMITY ANGIOGRAPHY;  Surgeon: Algernon Huxley, MD;  Location: [place] CV LAB;  Service: Cardiovascular;  Laterality: Left;  . PERIPHERAL ARTERIAL STENT GRAFT     x2 left/right  . PERIPHERAL VASCULAR BALLOON ANGIOPLASTY Right 01/06/2018   Procedure: PERIPHERAL VASCULAR BALLOON ANGIOPLASTY;  Surgeon: Katha Cabal, MD;  Location: [place] CV LAB;  Service: Cardiovascular;  Laterality: Right;  . PERIPHERAL VASCULAR CATHETERIZATION N/A 07/06/2015   Procedure: Abdominal Aortogram w/Lower Extremity;  Surgeon: Algernon Huxley, MD;  Location: [place] CV LAB;  Service: Cardiovascular;  Laterality: N/A;  . PERIPHERAL VASCULAR CATHETERIZATION  07/06/2015   Procedure: Lower Extremity Intervention;  Surgeon: Algernon Huxley, MD;  Location: [place] CV LAB;  Service: Cardiovascular;;   Family History  Problem Relation Age of Onset  . Heart attack Father   . Heart disease Father   . Hypertension Father   . Hyperlipidemia Father   . Hypertension Mother   . Hyperlipidemia Mother    Social History   Tobacco Use  . Smoking status: Current Every Day Smoker    Packs/day: 1.00    Years: 41.00    Pack years: 41.00    Types: Cigarettes  . Smokeless tobacco: Never Used  Substance Use Topics  . Alcohol use: No   Allergies   Allergen Reactions  . Dulaglutide Anaphylaxis, Diarrhea and Hives  . Other Itching    Skin itching associated with nitro patch  . Prednisone Rash   Prior to Admission medications   Medication Sig Start Date End Date Taking? Authorizing Provider  albuterol (VENTOLIN HFA) 108 (90 Base) MCG/ACT inhaler Inhale into the lungs every 6 (six) hours as needed for wheezing or shortness of breath.   Yes [provider]  aspirin EC 81 MG tablet Take 1 tablet (81 mg total) by mouth daily. 05/11/15  Yes Wellington Hampshire, MD  atorvastatin (LIPITOR) 80 MG tablet Take 80 mg by mouth daily.   Yes [provider]  calcium carbonate (TUMS - DOSED IN MG ELEMENTAL CALCIUM) 500 MG chewable tablet Chew 4 tablets by mouth daily as needed for indigestion or heartburn.   Yes [provider]  cephALEXin (KEFLEX) 500 MG capsule  Take 1 capsule (500 mg total) by mouth 2 (two) times daily. 02/25/19  Yes Darylene Price A, FNP  clopidogrel (PLAVIX) 75 MG tablet Take 75 mg by mouth daily.  08/22/17  Yes [provider]  diphenhydrAMINE (BENADRYL) 25 MG tablet Take 25 mg by mouth daily as needed for allergies.   Yes [provider]  ezetimibe (ZETIA) 10 MG tablet Take 10 mg by mouth daily.   Yes [provider]  gabapentin (NEURONTIN) 800 MG tablet Take 800 mg by mouth 4 (four) times daily.  08/14/17 03/01/19 Yes [provider]  Insulin Glargine (LANTUS SOLOSTAR) 100 UNIT/ML Solostar Pen Inject 30 Units into the skin at bedtime. 04/07/18  Yes Mayo, Pete Pelt, MD  insulin lispro (HUMALOG) 100 UNIT/ML injection Sliding Scale Insulin before meals using Novolog If BS is 0 to 70 no insulin and eat/glucose tablets/glucose get If BS 71 to 150 great! If BS 151 to 200, 2 Units Novolog If BS 201 to 250, 4 Units Novolog If BS 251 to 300, 6 Units Novolog If BS 301 to 350, 8 Units Novolog If BS 351 to 400, 10 Units Novolog If BS greater than 400, take 10 Units and Contact our office  during office hour 04/14/18  Yes [provider]  ipratropium-albuterol (DUONEB) 0.5-2.5 (3) MG/3ML SOLN Inhale 3 mLs into the lungs every 6 (six) hours as needed (wheezing/sob).    Yes [provider]  isosorbide mononitrate (IMDUR) 30 MG 24 hr tablet Take 1 tablet (30 mg total) by mouth daily. 11/10/18  Yes Dustin Flock, MD  metFORMIN (GLUCOPHAGE-XR) 500 MG 24 hr tablet Take 1,000 mg by mouth 2 (two) times daily.    Yes [provider]  metoprolol succinate (TOPROL-XL) 25 MG 24 hr tablet Take 12.5 mg by mouth daily.  03/19/18 03/19/19 Yes [provider]  Multiple Vitamin (MULTI-VITAMINS) TABS Take 1 tablet by mouth daily.    Yes [provider]  multivitamin-lutein (OCUVITE-LUTEIN) CAPS capsule Take 1 capsule by mouth daily. 01/09/18  Yes Demetrios Loll, MD  nitroGLYCERIN (NITRODUR - DOSED IN MG/24 HR) 0.4 mg/hr patch Place 0.4 mg onto the skin daily.   Yes [provider]  nitroGLYCERIN (NITROSTAT) 0.4 MG SL tablet Place 0.4 mg under the tongue every 5 (five) minutes as needed for chest pain.   Yes [provider]  Oxycodone HCl 10 MG TABS Take 10 mg by mouth every 6 (six) hours as needed (pain).  01/15/18  Yes [provider]  potassium chloride (K-DUR) 10 MEQ tablet Take 10 mEq by mouth daily.   Yes [provider]  promethazine (PHENERGAN) 25 MG tablet TAKE 1 TABLET BY MOUTH EVERY 6 HOURS AS NEEDED NAUSEA 04/29/18  Yes [provider]  spironolactone (ALDACTONE) 25 MG tablet Take 25 mg by mouth daily.  03/02/18  Yes [provider]  torsemide (DEMADEX) 20 MG tablet Take by mouth daily. Patient taking 60mg  Am/ 20mg  PM   Yes [provider]  traZODone (DESYREL) 50 MG tablet Take 50 mg by mouth at bedtime as needed for sleep.  08/12/17  Yes [provider]     Review of Systems  Constitutional: Positive for fatigue (easily). Negative for appetite change.  HENT: Negative for congestion,  postnasal drip and sore throat.   Eyes: Negative.   Respiratory: Positive for shortness of breath (with minimal exertion). Negative for cough, chest tightness and wheezing.   Cardiovascular: Positive for leg swelling (better). Negative for chest pain and palpitations.  Gastrointestinal:  Negative for abdominal distention and abdominal pain.  Endocrine: Negative.   Genitourinary: Negative.   Musculoskeletal: Positive for back pain. Negative for neck pain.       Has right lower leg prosthesis  Skin: Negative.   Allergic/Immunologic: Negative.   Neurological: Negative for dizziness and light-headedness.  Hematological: Negative for adenopathy. Bruises/bleeds easily.  Psychiatric/Behavioral: Positive for sleep disturbance (sleeping on 2 pillows). Negative for dysphoric mood. The patient is not nervous/anxious.    Vitals:   03/01/19 1116  BP: 111/82  Pulse: (!) 103  Resp: 18  SpO2: 99%  Weight: 212 lb (96.2 kg)  Height: 5\' 7"  (1.702 m)   Wt Readings from Last 3 Encounters:  03/01/19 212 lb (96.2 kg)  02/25/19 218 lb 8 oz (99.1 kg)  02/22/19 221 lb 8 oz (100.5 kg)   Lab Results  Component Value Date   CREATININE 0.97 02/26/2019   CREATININE 1.25 (H) 02/23/2019   CREATININE 1.28 (H) 11/26/2018    Physical Exam  Constitutional: He is oriented to person, place, and time. He appears well-developed and well-nourished.  HENT:  Head: Normocephalic and atraumatic.  Neck: Normal range of motion. Neck supple. No JVD present.  Cardiovascular: Regular rhythm. Tachycardia present.  Pulmonary/Chest: Effort normal. He has no wheezes. He has no rhonchi. He has no rales.  Abdominal: Soft. He exhibits no distension. There is no abdominal tenderness.  Musculoskeletal:        General: Edema (3+ pitting edema in left lower leg to knee) present. No tenderness.  Neurological: He is alert and oriented to person, place, and time.  Skin: Skin is warm and dry.  Psychiatric: He has a normal mood and  affect. His behavior is normal. Thought content normal.  Nursing note and vitals reviewed.   Assessment & Plan:  1: Chronic heart failure with reduced ejection fraction- - NYHA class III - mildly fluid overloaded with pitting edema - weighing daily and he was reminded to call for an overnight weight gain of >2 pounds or a weekly weight gain of >5 pounds - weight down 6 pounds from last visit here 4 days ago - received 80mg  IV lasix/ 28meq PO potassium 3 days ago - patient currently taking torsemide 60mg  AM/ 20mg  PM along with 25mg  spironolactone - discussed adding entresto and patient is agreeable to start 24/26mg  BID; will decrease his torsemide to 40mg  AM/ 20mg  PM - will check BMP at his next visit - he says that he's waiting on a BP cuff so that he can check it daily; he's to call us if he begins to notice any dizziness - not adding salt and has been using Mrs. Dash & trying to read food labels.  - has [place] - drinking ~ 64 ounces of fluid daily (Mtn Dew, pepsi, water) - had telemedicine visit with cardiologist Rockey Situ) 12/14/2018 - BNP 02/26/2019 was 2261.0  2: HTN- - BP looks good although is on the low side; adjusting diuretic per above - saw PCP Vickki Muff) 02/16/2019 - BMP done 02/26/2019 reviewed and shows sodium 131, potassium 4.3, creatinine 0.97 and GFR >60  3: Cellulitis- - left lateral lower leg has minimal redness - still taking his antibiotics  4: Tobacco use- - currently smoking <1/2 ppd of cigarettes & has no desire to quit - complete cessation discussed for 3 minutes with him  5: Lymphedema- - stage 2 - trying to elevate his leg some during the day (has right leg prosthesis) - has compression type sock on but edema  persists - limited in his ability to exercise due to shortness of breath - consider lymphapress compression boot if edema persists after making medication changes per above  Patient did not bring his medications nor a list. Each medication  was verbally reviewed with the patient and he was encouraged to bring the bottles to every visit to confirm accuracy of list.  Return in 3 weeks or sooner for any questions/problems before then.

## 2019-03-01 ENCOUNTER — Encounter: Payer: Self-pay | Admitting: Family

## 2019-03-01 ENCOUNTER — Ambulatory Visit: Payer: Medicare Other | Attending: Family | Admitting: Family

## 2019-03-01 VITALS — BP 111/82 | HR 103 | Resp 18 | Ht 67.0 in | Wt 212.0 lb

## 2019-03-01 DIAGNOSIS — I10 Essential (primary) hypertension: Secondary | ICD-10-CM

## 2019-03-01 DIAGNOSIS — Z955 Presence of coronary angioplasty implant and graft: Secondary | ICD-10-CM | POA: Diagnosis not present

## 2019-03-01 DIAGNOSIS — J45909 Unspecified asthma, uncomplicated: Secondary | ICD-10-CM | POA: Insufficient documentation

## 2019-03-01 DIAGNOSIS — Z794 Long term (current) use of insulin: Secondary | ICD-10-CM | POA: Insufficient documentation

## 2019-03-01 DIAGNOSIS — I5042 Chronic combined systolic (congestive) and diastolic (congestive) heart failure: Secondary | ICD-10-CM | POA: Insufficient documentation

## 2019-03-01 DIAGNOSIS — E1151 Type 2 diabetes mellitus with diabetic peripheral angiopathy without gangrene: Secondary | ICD-10-CM | POA: Diagnosis not present

## 2019-03-01 DIAGNOSIS — E785 Hyperlipidemia, unspecified: Secondary | ICD-10-CM | POA: Insufficient documentation

## 2019-03-01 DIAGNOSIS — Z720 Tobacco use: Secondary | ICD-10-CM

## 2019-03-01 DIAGNOSIS — I2510 Atherosclerotic heart disease of native coronary artery without angina pectoris: Secondary | ICD-10-CM | POA: Insufficient documentation

## 2019-03-01 DIAGNOSIS — I110 Hypertensive heart disease with heart failure: Secondary | ICD-10-CM | POA: Insufficient documentation

## 2019-03-01 DIAGNOSIS — A5216 Charcot's arthropathy (tabetic): Secondary | ICD-10-CM | POA: Diagnosis not present

## 2019-03-01 DIAGNOSIS — Z7982 Long term (current) use of aspirin: Secondary | ICD-10-CM | POA: Diagnosis not present

## 2019-03-01 DIAGNOSIS — L03116 Cellulitis of left lower limb: Secondary | ICD-10-CM

## 2019-03-01 DIAGNOSIS — N400 Enlarged prostate without lower urinary tract symptoms: Secondary | ICD-10-CM | POA: Insufficient documentation

## 2019-03-01 DIAGNOSIS — I890 Lymphedema, not elsewhere classified: Secondary | ICD-10-CM | POA: Insufficient documentation

## 2019-03-01 DIAGNOSIS — I5022 Chronic systolic (congestive) heart failure: Secondary | ICD-10-CM

## 2019-03-01 DIAGNOSIS — Z7902 Long term (current) use of antithrombotics/antiplatelets: Secondary | ICD-10-CM | POA: Insufficient documentation

## 2019-03-01 DIAGNOSIS — F17210 Nicotine dependence, cigarettes, uncomplicated: Secondary | ICD-10-CM | POA: Insufficient documentation

## 2019-03-01 DIAGNOSIS — E1140 Type 2 diabetes mellitus with diabetic neuropathy, unspecified: Secondary | ICD-10-CM | POA: Diagnosis not present

## 2019-03-01 DIAGNOSIS — M1990 Unspecified osteoarthritis, unspecified site: Secondary | ICD-10-CM | POA: Insufficient documentation

## 2019-03-01 DIAGNOSIS — Z79899 Other long term (current) drug therapy: Secondary | ICD-10-CM | POA: Insufficient documentation

## 2019-03-01 DIAGNOSIS — G2581 Restless legs syndrome: Secondary | ICD-10-CM | POA: Insufficient documentation

## 2019-03-01 DIAGNOSIS — J449 Chronic obstructive pulmonary disease, unspecified: Secondary | ICD-10-CM | POA: Insufficient documentation

## 2019-03-01 DIAGNOSIS — I255 Ischemic cardiomyopathy: Secondary | ICD-10-CM | POA: Insufficient documentation

## 2019-03-01 MED ORDER — SACUBITRIL-VALSARTAN 24-26 MG PO TABS: 1.0000 | ORAL_TABLET | Freq: Two times a day (BID) | ORAL | 3 refills | Status: DC

## 2019-03-01 NOTE — Patient Instructions (Addendum)
Continue weighing daily and call for an overnight weight gain of > 2 pounds or a weekly weight gain of >5 pounds.   Decrease torsemide (fluid pill) to 2 tablets in the morning and 1 tablet in the afternoon.   Begin entresto 24/26mg  twice daily

## 2019-03-08 ENCOUNTER — Other Ambulatory Visit: Payer: Self-pay

## 2019-03-08 ENCOUNTER — Emergency Department
Admission: EM | Admit: 2019-03-08 | Discharge: 2019-03-08 | Disposition: A | Discharge status: Home / Self Care | Payer: Medicare Other | Attending: Emergency Medicine | Admitting: Emergency Medicine

## 2019-03-08 ENCOUNTER — Emergency Department: Payer: Medicare Other

## 2019-03-08 ENCOUNTER — Encounter: Payer: Self-pay | Admitting: Emergency Medicine

## 2019-03-08 DIAGNOSIS — F17210 Nicotine dependence, cigarettes, uncomplicated: Secondary | ICD-10-CM | POA: Diagnosis not present

## 2019-03-08 DIAGNOSIS — R319 Hematuria, unspecified: Secondary | ICD-10-CM

## 2019-03-08 DIAGNOSIS — N50819 Testicular pain, unspecified: Secondary | ICD-10-CM

## 2019-03-08 DIAGNOSIS — Z7901 Long term (current) use of anticoagulants: Secondary | ICD-10-CM | POA: Insufficient documentation

## 2019-03-08 DIAGNOSIS — N451 Epididymitis: Secondary | ICD-10-CM | POA: Diagnosis not present

## 2019-03-08 DIAGNOSIS — E119 Type 2 diabetes mellitus without complications: Secondary | ICD-10-CM | POA: Insufficient documentation

## 2019-03-08 DIAGNOSIS — Z959 Presence of cardiac and vascular implant and graft, unspecified: Secondary | ICD-10-CM | POA: Insufficient documentation

## 2019-03-08 DIAGNOSIS — Z79899 Other long term (current) drug therapy: Secondary | ICD-10-CM | POA: Diagnosis not present

## 2019-03-08 DIAGNOSIS — Z89511 Acquired absence of right leg below knee: Secondary | ICD-10-CM | POA: Diagnosis not present

## 2019-03-08 DIAGNOSIS — Z794 Long term (current) use of insulin: Secondary | ICD-10-CM | POA: Diagnosis not present

## 2019-03-08 DIAGNOSIS — N390 Urinary tract infection, site not specified: Secondary | ICD-10-CM | POA: Diagnosis not present

## 2019-03-08 DIAGNOSIS — J449 Chronic obstructive pulmonary disease, unspecified: Secondary | ICD-10-CM | POA: Insufficient documentation

## 2019-03-08 DIAGNOSIS — I509 Heart failure, unspecified: Secondary | ICD-10-CM | POA: Diagnosis not present

## 2019-03-08 DIAGNOSIS — I2510 Atherosclerotic heart disease of native coronary artery without angina pectoris: Secondary | ICD-10-CM | POA: Diagnosis not present

## 2019-03-08 DIAGNOSIS — I110 Hypertensive heart disease with heart failure: Secondary | ICD-10-CM | POA: Insufficient documentation

## 2019-03-08 DIAGNOSIS — Z7982 Long term (current) use of aspirin: Secondary | ICD-10-CM | POA: Diagnosis not present

## 2019-03-08 DIAGNOSIS — N5089 Other specified disorders of the male genital organs: Secondary | ICD-10-CM

## 2019-03-08 LAB — URINALYSIS, COMPLETE (UACMP) WITH MICROSCOPIC
Bilirubin Urine: NEGATIVE
Glucose, UA: NEGATIVE mg/dL
Ketones, ur: NEGATIVE mg/dL
Nitrite: POSITIVE — AB
Protein, ur: 30 mg/dL — AB
Specific Gravity, Urine: 1.01 (ref 1.005–1.030)
WBC, UA: >50 WBC/hpf — ABNORMAL HIGH (ref 0–5)
pH: 5 (ref 5.0–8.0)

## 2019-03-08 MED ORDER — LEVOFLOXACIN 500 MG PO TABS: 500.0000 mg | ORAL_TABLET | Freq: Two times a day (BID) | ORAL | 0 refills | Status: AC

## 2019-03-08 MED ORDER — NAPROXEN 500 MG PO TABS: 500.0000 mg | ORAL_TABLET | Freq: Two times a day (BID) | ORAL | 2 refills | Status: DC

## 2019-03-08 MED ORDER — KETOROLAC TROMETHAMINE 30 MG/ML IJ SOLN
30.0000 mg | Freq: Once | INTRAMUSCULAR | Status: AC
  Administered 2019-03-08: 30 mg via INTRAMUSCULAR
  Filled 2019-03-08: qty 1

## 2019-03-08 MED ORDER — LEVOFLOXACIN 500 MG PO TABS
500.0000 mg | ORAL_TABLET | Freq: Once | ORAL | Status: AC
  Administered 2019-03-08: 500 mg via ORAL
  Filled 2019-03-08: qty 1

## 2019-03-08 NOTE — ED Provider Notes (Signed)
[facility] Emergency Department Provider Note   ____________________________________________    I have reviewed the triage vital signs and the nursing notes.   HISTORY  Chief Complaint Groin Swelling and Testicle Pain     HPI [name] [name] is a 58 y.o. male who presents with complaints of right scrotal pain and tenderness which developed about 3 days ago and is gradually gotten worse.  He denies injury to the area.  He is never had this before.  He denies the possibility of an STD.  No penile discharge.  No dysuria.  Does not take anything for this.  No fevers or chills nausea or vomiting.  He is diabetic  Past Medical History:  Diagnosis Date  . Arthritis   . Asthma   . Atherosclerosis   . BPH (benign prostatic hyperplasia)   . Carotid arterial disease ([place])   . Charcot's joint of foot, right   . Chronic combined systolic (congestive) and diastolic (congestive) heart failure ([place])    a. 02/2013 EF 50% by LV gram; b. 04/2017 Echo: EF 25-30%. diff HK. Gr2 DD. Mod dil LA/RV. PASP 32mmHg.  [name] COPD (chronic obstructive pulmonary disease) ([place])   . Coronary artery disease    a. 2013 S/P PCI of LAD [facility]);  b. 03/2013 PCI: RCA 90p ( 2.5 x 23 mm DES); c. 02/2014 Cath: patent RCA stent->Med Rx; d. 04/2017 Cath: LM nl, LAd 30ost/p, 34m/d, D1 80ost, LCX 90p/m, OM1 85, RCA 70/20p, 65m, 70d->Referred for CT Surg-felt to be poor candidate.  . Diabetic neuropathy ([place])   . Diabetic ulcer of right foot ([place])   . Hernia   . Hyperlipidemia   . Hypertension   . Ischemic cardiomyopathy    a. 04/2017 Echo: EF 25-30%; b. 08/2017 Cardiac MRI ([facility]): EF 19%, sev glob HK. RVEF 20%, mod BAE, triv MR, mild-mod TR. Basal lateral subendocardial infarct (viable), inf/infsept, dist septal ischemia, basal to mid lat peri-infarct ischemia.  . Morbid obesity ([place])   . Neuropathy   . PAD (peripheral artery disease) ([place])    a. Followed by Dr. Lucky Cowboy; b. 08/2010 Periph Angio: RSFA  70-80p (6X60 self-expanding stent); c. 09/2010 Periph Angio: L SFA 100p (7x unknown length self-expanding stent); d. 06/2015 Periph Angio: R SFA short segment occlusion (6x12 self-expanding stent).  . Restless leg syndrome   . Sleep apnea   . Subclavian artery stenosis, right ([place])   . Syncope and collapse   . Tobacco use    a. 75+ yr hx - still smoking 1ppd, down from 2 ppd.  . Type II diabetes mellitus ([place])   . Varicose vein     Patient Active Problem List   Diagnosis Date Noted  . Acute on chronic systolic CHF (congestive heart failure) ([place]) 11/05/2018  . Open toe wound 10/01/2018  . Venous ulcer of left leg ([place]) 08/19/2018  . 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[place]) 04/28/2018  . Sepsis ([place]) 03/30/2018  . Abnormal MRI, shoulder (Right) 02/23/2018  . Abnormal MRI, cervical spine (2016) 02/23/2018  . DDD (degenerative disc disease), cervical 02/23/2018  . Cervical foraminal stenosis (Bilateral) 02/23/2018  . Cervical central spinal stenosis 02/23/2018  . Cervical facet hypertrophy 02/23/2018  . Arthralgia of acromioclavicular joint (Right) 02/23/2018  . Osteoarthritis of  AC (acromioclavicular) joint (Right) 02/23/2018  . Biceps tendinosis of shoulder (Right) 02/23/2018  . Tendinopathy of rotator cuff (Right) 02/23/2018  . Vitamin D deficiency 02/23/2018  . Atherosclerosis of native arteries of the extremities with  ulceration ([place]) 02/15/2018  . Peripheral edema 02/05/2018  . Status post peripherally inserted central catheter (PICC) central line placement 02/04/2018  . Chronic lower extremity pain (Primary Area of Pain) (Right) 01/29/2018  . Chronic shoulder pain [facility] Area of Pain) (Right) 01/29/2018  . Chronic foot pain (Secondary Area of Pain) (Right) 01/29/2018  . Chronic pain syndrome 01/29/2018  . Long term current use of opiate analgesic 01/29/2018  . Medication monitoring encounter 01/29/2018  . Disorder of skeletal system 01/29/2018  . Problems  influencing health status 01/29/2018  . AKI (acute kidney injury) ([place]) 01/03/2018  . NSTEMI (non-ST elevated myocardial infarction) ([place]) 11/21/2017  . Coronary artery disease of native artery of native heart with stable angina pectoris ([place]) 09/10/2017  . HLD (hyperlipidemia) 09/10/2017  . Angina pectoris ([place]) 09/08/2017  . Angina at rest [facility]) 08/13/2017  . Lymphedema 05/02/2017  . Chronic systolic HF (heart failure) ([place]) 04/23/2017  . Chest pain 07/11/2016  . Unstable angina ([place]) 07/11/2016  . Foot ulcer (Right)   . Stable angina (HCC)   . Pressure injury of skin 05/06/2016  . Diabetic foot infection ([place]) 05/05/2016  . Type 2 diabetes mellitus with complication, with long-term current use of insulin ([place]) 01/26/2016  . Cellulitis 11/21/2015  . Morbid obesity ([place]) 11/17/2014  . PAD (peripheral artery disease) ([place])   . Hypertension   . Tobacco use   . COPD (chronic obstructive pulmonary disease) ([place]) 05/19/2012  . Diabetic neuropathy ([place]) 05/19/2012    Past Surgical History:  Procedure Laterality Date  . ABDOMINAL AORTAGRAM N/A 06/23/2013   Procedure: ABDOMINAL Maxcine Ham;  Surgeon: Wellington Hampshire, MD;  Location: [place] CATH LAB;  Service: Cardiovascular;  Laterality: N/A;  . AMPUTATION Right 04/01/2018   Procedure: AMPUTATION BELOW KNEE;  Surgeon: Algernon Huxley, MD;  Location: [facility] ORS;  Service: Vascular;  Laterality: Right;  . APPENDECTOMY    . CARDIAC CATHETERIZATION  10/14   [facility]; X1 STENT PROXIMAL RCA  . CARDIAC CATHETERIZATION  02/2010   [facility]  . CARDIAC CATHETERIZATION  02/21/2014   [facility]  . CLAVICLE SURGERY    . HERNIA REPAIR    . IRRIGATION AND DEBRIDEMENT FOOT Right 01/04/2018   Procedure: IRRIGATION AND DEBRIDEMENT FOOT;  Surgeon: Samara Deist, DPM;  Location: [facility] ORS;  Service: Podiatry;  Laterality: Right;  . LEFT HEART CATH AND CORS/GRAFTS ANGIOGRAPHY N/A 04/28/2017   Procedure: LEFT HEART CATH AND CORONARY ANGIOGRAPHY;  Surgeon: Wellington Hampshire, MD;   Location: [place] CV LAB;  Service: Cardiovascular;  Laterality: N/A;  . LOWER EXTREMITY ANGIOGRAPHY Right 03/03/2018   Procedure: LOWER EXTREMITY ANGIOGRAPHY;  Surgeon: Katha Cabal, MD;  Location: [place] CV LAB;  Service: Cardiovascular;  Laterality: Right;  . LOWER EXTREMITY ANGIOGRAPHY Left 08/24/2018   Procedure: LOWER EXTREMITY ANGIOGRAPHY;  Surgeon: Algernon Huxley, MD;  Location: [place] CV LAB;  Service: Cardiovascular;  Laterality: Left;  . PERIPHERAL ARTERIAL STENT GRAFT     x2 left/right  . PERIPHERAL VASCULAR BALLOON ANGIOPLASTY Right 01/06/2018   Procedure: PERIPHERAL VASCULAR BALLOON ANGIOPLASTY;  Surgeon: Katha Cabal, MD;  Location: [place] CV LAB;  Service: Cardiovascular;  Laterality: Right;  . PERIPHERAL VASCULAR CATHETERIZATION N/A 07/06/2015   Procedure: Abdominal Aortogram w/Lower Extremity;  Surgeon: Algernon Huxley, MD;  Location: [place] CV LAB;  Service: Cardiovascular;  Laterality: N/A;  . PERIPHERAL VASCULAR CATHETERIZATION  07/06/2015   Procedure: Lower Extremity Intervention;  Surgeon: Algernon Huxley, MD;  Location: [place] CV LAB;  Service: Cardiovascular;;  Prior to Admission medications   Medication Sig Start Date End Date Taking? Authorizing Provider  albuterol (VENTOLIN HFA) 108 (90 Base) MCG/ACT inhaler Inhale into the lungs every 6 (six) hours as needed for wheezing or shortness of breath.    [provider]  aspirin EC 81 MG tablet Take 1 tablet (81 mg total) by mouth daily. 05/11/15   Wellington Hampshire, MD  atorvastatin (LIPITOR) 80 MG tablet Take 80 mg by mouth daily.    [provider]  calcium carbonate (TUMS - DOSED IN MG ELEMENTAL CALCIUM) 500 MG chewable tablet Chew 4 tablets by mouth daily as needed for indigestion or heartburn.    [provider]  cephALEXin (KEFLEX) 500 MG capsule Take 1 capsule (500 mg total) by mouth 2 (two) times daily. 02/25/19   Alisa Graff, FNP  clopidogrel  (PLAVIX) 75 MG tablet Take 75 mg by mouth daily.  08/22/17   [provider]  diphenhydrAMINE (BENADRYL) 25 MG tablet Take 25 mg by mouth daily as needed for allergies.    [provider]  ezetimibe (ZETIA) 10 MG tablet Take 10 mg by mouth daily.    [provider]  gabapentin (NEURONTIN) 800 MG tablet Take 800 mg by mouth 4 (four) times daily.  08/14/17 03/01/19  [provider]  Insulin Glargine (LANTUS SOLOSTAR) 100 UNIT/ML Solostar Pen Inject 30 Units into the skin at bedtime. 04/07/18   Mayo, Pete Pelt, MD  insulin lispro (HUMALOG) 100 UNIT/ML injection Sliding Scale Insulin before meals using Novolog If BS is 0 to 70 no insulin and eat/glucose tablets/glucose get If BS 71 to 150 great! If BS 151 to 200, 2 Units Novolog If BS 201 to 250, 4 Units Novolog If BS 251 to 300, 6 Units Novolog If BS 301 to 350, 8 Units Novolog If BS 351 to 400, 10 Units Novolog If BS greater than 400, take 10 Units and Contact our office during office hour 04/14/18   [provider]  ipratropium-albuterol (DUONEB) 0.5-2.5 (3) MG/3ML SOLN Inhale 3 mLs into the lungs every 6 (six) hours as needed (wheezing/sob).     [provider]  isosorbide mononitrate (IMDUR) 30 MG 24 hr tablet Take 1 tablet (30 mg total) by mouth daily. 11/10/18   Dustin Flock, MD  levofloxacin (LEVAQUIN) 500 MG tablet Take 1 tablet (500 mg total) by mouth 2 (two) times daily for 10 days. 03/08/19 03/18/19  Lavonia Drafts, MD  metFORMIN (GLUCOPHAGE-XR) 500 MG 24 hr tablet Take 1,000 mg by mouth 2 (two) times daily.     [provider]  metoprolol succinate (TOPROL-XL) 25 MG 24 hr tablet Take 12.5 mg by mouth daily.  03/19/18 03/19/19  [provider]  Multiple Vitamin (MULTI-VITAMINS) TABS Take 1 tablet by mouth daily.     [provider]  multivitamin-lutein (OCUVITE-LUTEIN) CAPS capsule Take 1 capsule by mouth daily. 01/09/18   Demetrios Loll, MD  naproxen (NAPROSYN) 500 MG tablet  Take 1 tablet (500 mg total) by mouth 2 (two) times daily with a meal. 03/08/19   Lavonia Drafts, MD  nitroGLYCERIN (NITRODUR - DOSED IN MG/24 HR) 0.4 mg/hr patch Place 0.4 mg onto the skin daily.    [provider]  nitroGLYCERIN (NITROSTAT) 0.4 MG SL tablet Place 0.4 mg under the tongue every 5 (five) minutes as needed for chest pain.    [provider]  Oxycodone HCl 10 MG TABS Take 10 mg by mouth every 6 (six) hours as needed (pain).  01/15/18   [provider]  potassium chloride (K-DUR) 10 MEQ tablet Take 10 mEq by mouth daily.    [provider]  promethazine (PHENERGAN) 25 MG tablet TAKE 1 TABLET BY MOUTH EVERY 6 HOURS AS NEEDED NAUSEA 04/29/18   [provider]  sacubitril-valsartan (ENTRESTO) 24-26 MG Take 1 tablet by mouth 2 (two) times daily. 03/01/19   Alisa Graff, FNP  spironolactone (ALDACTONE) 25 MG tablet Take 25 mg by mouth daily.  03/02/18   [provider]  torsemide (DEMADEX) 20 MG tablet Take by mouth daily. Patient taking 40mg  Am/ 20mg  PM    [provider]  traZODone (DESYREL) 50 MG tablet Take 50 mg by mouth at bedtime as needed for sleep.  08/12/17   [provider]     Allergies Dulaglutide, Other, and Prednisone  Family History  Problem Relation Age of Onset  . Heart attack Father   . Heart disease Father   . Hypertension Father   . Hyperlipidemia Father   . Hypertension Mother   . Hyperlipidemia Mother     Social History Social History   Tobacco Use  . Smoking status: Current Every Day Smoker    Packs/day: 1.00    Years: 41.00    Pack years: 41.00    Types: Cigarettes  . Smokeless tobacco: Never Used  Substance Use Topics  . Alcohol use: No  . Drug use: No    Review of Systems  Constitutional: No fever/chills Eyes: No visual changes.  ENT: No sore throat. Cardiovascular: Denies chest pain. Respiratory: Denies shortness of breath. Gastrointestinal: No abdominal pain.  No  nausea, no vomiting.   Genitourinary: Negative for dysuria. Musculoskeletal: Negative for back pain. Skin: Negative for rash. Neurological: Negative for headaches or weakness   ____________________________________________   PHYSICAL EXAM:  VITAL SIGNS: ED Triage Vitals  Enc Vitals Group     BP 03/08/19 2022 122/80     Pulse Rate 03/08/19 2022 (!) 110     Resp 03/08/19 2022 20     Temp 03/08/19 2022 98.6 F (37 C)     Temp Source 03/08/19 2022 Oral     SpO2 03/08/19 2022 98 %     Weight 03/08/19 2023 93 kg (205 lb)     Height 03/08/19 2023 1.702 m (5\' 7" )     Head Circumference --      Peak Flow --      Pain Score 03/08/19 2022 9     Pain Loc --      Pain Edu? --      Excl. in [place]? --     Constitutional: Alert and oriented. No acute distress.   Cardiovascular: Normal rate, regular rhythm.  Good peripheral circulation. Respiratory: Normal respiratory effort.  No retractions. Lungs CTAB. Gastrointestinal: Soft and nontender. No distention.  No CVA tenderness. Genitourinary: Patient with tenderness palpation to the right scrotum, especially along the lateral border, suspicious for epididymitis, no discharge Musculoskeletal:   Warm and well perfused Neurologic:  Normal speech and language. No gross focal neurologic deficits are appreciated.  Skin:  Skin is warm, dry and intact. No rash noted. Psychiatric: Mood and affect are normal. Speech and behavior are normal.  ____________________________________________   LABS (all labs ordered are listed, but only abnormal results are displayed)  Labs Reviewed  URINE CULTURE  URINALYSIS, COMPLETE (UACMP) WITH MICROSCOPIC   ____________________________________________  EKG  None ____________________________________________  RADIOLOGY  Ultrasound is consistent with right epididymitis ____________________________________________   PROCEDURES  Procedure(s)  performed: No  Procedures   Critical Care performed: No  ____________________________________________   INITIAL IMPRESSION / ASSESSMENT AND PLAN / ED COURSE  Pertinent labs & imaging results that were available during my care of the patient were reviewed by me and considered in my medical decision making (see chart for details).  Patient's exam and ultrasound consistent with right epididymitis, will give p.o. Levaquin, IM Toradol for pain.  Pending urinalysis, anticipate discharge with antibiotics and NSAIDs    ____________________________________________   FINAL CLINICAL IMPRESSION(S) / ED DIAGNOSES  Final diagnoses:  Epididymitis        Note:  This document was prepared using Dragon voice recognition software and may include unintentional dictation errors.   Lavonia Drafts, MD 03/08/19 2139

## 2019-03-08 NOTE — ED Triage Notes (Signed)
Pt presents to ED with right sided testicular pain and swelling for about three days. Pt unsure of redness to affected area. Denies similar symptoms previously. No penile drainage but pt does report some pain when he urinates.

## 2019-03-11 LAB — URINE CULTURE: Culture: >=100000 — AB

## 2019-03-22 ENCOUNTER — Ambulatory Visit: Payer: Medicare Other | Admitting: Family

## 2019-03-23 ENCOUNTER — Ambulatory Visit (INDEPENDENT_AMBULATORY_CARE_PROVIDER_SITE_OTHER): Payer: Medicare Other | Admitting: Nurse Practitioner

## 2019-03-23 ENCOUNTER — Ambulatory Visit (INDEPENDENT_AMBULATORY_CARE_PROVIDER_SITE_OTHER): Payer: Medicare Other

## 2019-03-23 ENCOUNTER — Other Ambulatory Visit: Payer: Self-pay

## 2019-03-23 ENCOUNTER — Encounter (INDEPENDENT_AMBULATORY_CARE_PROVIDER_SITE_OTHER): Payer: Self-pay | Admitting: Nurse Practitioner

## 2019-03-23 VITALS — BP 111/69 | HR 112 | Resp 19 | Ht 67.0 in | Wt 203.0 lb

## 2019-03-23 DIAGNOSIS — I739 Peripheral vascular disease, unspecified: Secondary | ICD-10-CM

## 2019-03-23 DIAGNOSIS — R609 Edema, unspecified: Secondary | ICD-10-CM | POA: Diagnosis not present

## 2019-03-23 DIAGNOSIS — M5030 Other cervical disc degeneration, unspecified cervical region: Secondary | ICD-10-CM

## 2019-03-23 NOTE — Progress Notes (Signed)
SUBJECTIVE:  Patient ID: [name] [name], male    DOB: 03/29/1961, 58 y.o.   MRN: [id number] Chief Complaint  Patient presents with  . Follow-up    HPI  [name] [name] is a 58 y.o. male The patient returns to the office for followup and review of the noninvasive studies.The patient notes interval shortening of their claudication distance and no development of mild rest pain symptoms. No new ulcers or wounds have occurred since the last visit.  There have been no significant changes to the patient's overall health care.  The patient denies amaurosis fugax or recent TIA symptoms. There are no recent neurological changes noted. The patient denies history of DVT, PE or superficial thrombophlebitis. The patient denies recent episodes of angina or shortness of breath.   The patient admits that he smokes heavily on a daily basis.  ABI's Rt=N/A and Lt=1.02 (previous ABI's Rt=n/a and Lt=0.90) Duplex US of the lower extremity arterial system shows triphasic waveforms down to the proximal SFA where it transitions to biphasic with a patent stent.  At the mid SFA down to the tibial arteries the patient has monophasic waveforms throughout.  Previous test on 10/15/2018 showed that the patient had triphasic waveforms. Past Medical History:  Diagnosis Date  . Arthritis   . Asthma   . Atherosclerosis   . BPH (benign prostatic hyperplasia)   . Carotid arterial disease ([place])   . Charcot's joint of foot, right   . Chronic combined systolic (congestive) and diastolic (congestive) heart failure ([place])    a. 02/2013 EF 50% by LV gram; b. 04/2017 Echo: EF 25-30%. diff HK. Gr2 DD. Mod dil LA/RV. PASP 17mmHg.  [name] COPD (chronic obstructive pulmonary disease) ([place])   . Coronary artery disease    a. 2013 S/P PCI of LAD [facility]);  b. 03/2013 PCI: RCA 90p ( 2.5 x 23 mm DES); c. 02/2014 Cath: patent RCA stent->Med Rx; d. 04/2017 Cath: LM nl, LAd 30ost/p, 46m/d, D1 80ost, LCX 90p/m, OM1 85, RCA 70/20p, 38m, 70d->Referred for  CT Surg-felt to be poor candidate.  . Diabetic neuropathy ([place])   . Diabetic ulcer of right foot ([place])   . Hernia   . Hyperlipidemia   . Hypertension   . Ischemic cardiomyopathy    a. 04/2017 Echo: EF 25-30%; b. 08/2017 Cardiac MRI ([facility]): EF 19%, sev glob HK. RVEF 20%, mod BAE, triv MR, mild-mod TR. Basal lateral subendocardial infarct (viable), inf/infsept, dist septal ischemia, basal to mid lat peri-infarct ischemia.  . Morbid obesity ([place])   . Neuropathy   . PAD (peripheral artery disease) ([place])    a. Followed by Dr. Lucky Cowboy; b. 08/2010 Periph Angio: RSFA 70-80p (6X60 self-expanding stent); c. 09/2010 Periph Angio: L SFA 100p (7x unknown length self-expanding stent); d. 06/2015 Periph Angio: R SFA short segment occlusion (6x12 self-expanding stent).  . Restless leg syndrome   . Sleep apnea   . Subclavian artery stenosis, right ([place])   . Syncope and collapse   . Tobacco use    a. 75+ yr hx - still smoking 1ppd, down from 2 ppd.  . Type II diabetes mellitus ([place])   . Varicose vein     Past Surgical History:  Procedure Laterality Date  . ABDOMINAL AORTAGRAM N/A 06/23/2013   Procedure: ABDOMINAL Maxcine Ham;  Surgeon: Wellington Hampshire, MD;  Location: [place] CATH LAB;  Service: Cardiovascular;  Laterality: N/A;  . AMPUTATION Right 04/01/2018   Procedure: AMPUTATION BELOW KNEE;  Surgeon: Algernon Huxley, MD;  Location: [facility]  ORS;  Service: Vascular;  Laterality: Right;  . APPENDECTOMY    . CARDIAC CATHETERIZATION  10/14   [facility]; X1 STENT PROXIMAL RCA  . CARDIAC CATHETERIZATION  02/2010   [facility]  . CARDIAC CAT[facility]IZATION  [facility]/21/2014   [facility]  . CLAVICLE SURGERY    . HERNIA REPAIR    . IRRIGATION AND DEBRIDEMENT FOOT Right 01/04/2018   Procedure: IRRIGATION AND DEBRIDEMENT FOOT;  Surgeon: Samara Deist, DPM;  Location: [facility] ORS;  Service: Podiatry;  Laterality: Right;  . LEFT HEART CATH AND CORS/GRAFTS ANGIOGRAPHY N/A 04/28/2017   Procedure: LEFT HEART CATH AND CORONARY ANGIOGRAPHY;  Surgeon: Wellington Hampshire, MD;  Location: [place] CV LAB;  Service: Cardiovascular;  Laterality: N/A;  . LOWER EXTREMITY ANGIOGRAPHY Right 03/03/2018   Procedure: LOWER EXTREMITY ANGIOGRAPHY;  Surgeon: Katha Cabal, MD;  Location: [place] CV LAB;  Service: Cardiovascular;  Laterality: Right;  . LOWER EXTREMITY ANGIOGRAPHY Left 08/24/2018   Procedure: LOWER EXTREMITY ANGIOGRAPHY;  Surgeon: Algernon Huxley, MD;  Location: [place] CV LAB;  Service: Cardiovascular;  Laterality: Left;  . PERIPHERAL ARTERIAL STENT GRAFT     x2 left/right  . PERIPHERAL VASCULAR BALLOON ANGIOPLASTY Right 01/06/2018   Procedure: PERIPHERAL VASCULAR BALLOON ANGIOPLASTY;  Surgeon: Katha Cabal, MD;  Location: [place] CV LAB;  Service: Cardiovascular;  Laterality: Right;  . PERIPHERAL VASCULAR CATHETERIZATION N/A 07/06/2015   Procedure: Abdominal Aortogram w/Lower Extremity;  Surgeon: Algernon Huxley, MD;  Location: [place] CV LAB;  Service: Cardiovascular;  Laterality: N/A;  . PERIPHERAL VASCULAR CATHETERIZATION  07/06/2015   Procedure: Lower Extremity Intervention;  Surgeon: Algernon Huxley, MD;  Location: [place] CV LAB;  Service: Cardiovascular;;    Social History   Socioeconomic History  . Marital status: Single    Spouse name: Not on file  . Number of children: 0  . Years of education: 65  . Highest education level: 12th grade  Occupational History  . Occupation: on disability  Social Needs  . Financial resource strain: Not hard at all  . Food insecurity    Worry: Never true    Inability: Never true  . Transportation needs    Medical: No    Non-medical: No  Tobacco Use  . Smoking status: Current Every Day Smoker    Packs/day: 1.00    Years: 41.00    Pack years: 41.00    Types: Cigarettes  . Smokeless tobacco: Never Used  Substance and Sexual Activity  . Alcohol use: No  . Drug use: No  . Sexual activity: Yes  Lifestyle  . Physical activity    Days per week: 7 days    Minutes  per session: 30 min  . Stress: Rather much  Relationships  . Social connections    Talks on phone: More than three times a week    Gets together: Once a week    Attends religious service: Never    Active member of club or organization: No    Attends meetings of clubs or organizations: Never    Relationship status: Living with partner  . Intimate partner violence    Fear of current or ex partner: No    Emotionally abused: No    Physically abused: No    Forced sexual activity: No  Other Topics Concern  . Not on file  Social History Narrative   Lives at home in [place] with girlfriend. Independent at baseline.    Family History  Problem Relation Age of Onset  .  Heart attack Father   . Heart disease Father   . Hypertension Father   . Hyperlipidemia Father   . Hypertension Mother   . Hyperlipidemia Mother     Allergies  Allergen Reactions  . Dulaglutide Anaphylaxis, Diarrhea and Hives  . Other Itching    Skin itching associated with nitro patch  . Prednisone Rash     Review of Systems   Review of Systems: Negative Unless Checked Constitutional: [] Weight loss  [] Fever  [] Chills Cardiac: [] Chest pain   []  Atrial Fibrillation  [] Palpitations   [] Shortness of breath when laying flat   [] Shortness of breath with exertion. [] Shortness of breath at rest Vascular:  [] Pain in legs with walking   [] Pain in legs with standing [] Pain in legs when laying flat   [] Claudication    [] Pain in feet when laying flat    [] History of DVT   [] Phlebitis   [x] Swelling in legs   [] Varicose veins   [] Non-healing ulcers Pulmonary:   [] Uses home oxygen   [] Productive cough   [] Hemoptysis   [] Wheeze  [] COPD   [] Asthma Neurologic:  [] Dizziness   [] Seizures  [] Blackouts [] History of stroke   [] History of TIA  [] Aphasia   [] Temporary Blindness   [] Weakness or numbness in arm   [x] Weakness or numbness in leg Musculoskeletal:   [] Joint swelling   [] Joint pain   [] Low back pain  []  History of Knee  Replacement [x] Arthritis [] back Surgeries  []  Spinal Stenosis    Hematologic:  [] Easy bruising  [] Easy bleeding   [] Hypercoagulable state   [x] Anemic Gastrointestinal:  [] Diarrhea   [] Vomiting  [] Gastroesophageal reflux/heartburn   [] Difficulty swallowing. [] Abdominal pain Genitourinary:  [x] Chronic kidney disease   [] Difficult urination  [] Anuric   [] Blood in urine [] Frequent urination  [] Burning with urination   [] Hematuria Skin:  [] Rashes   [] Ulcers [] Wounds Psychological:  [] History of anxiety   []  History of major depression  []  Memory Difficulties      OBJECTIVE:   Physical Exam  BP 111/69 (BP Location: Right Arm)   Pulse (!) 112   Resp 19   Ht 5\' 7"  (1.702 m)   Wt 203 lb (92.1 kg)   BMI 31.79 kg/m   Gen: WD/WN, NAD Head: [place]/AT, No temporalis wasting.  Ear/Nose/Throat: Hearing grossly intact, nares w/o erythema or drainage Eyes: PER, EOMI, sclera nonicteric.  Neck: Supple, no masses.  No JVD.  Pulmonary:  Good air movement, no use of accessory muscles.  Cardiac: RRR Vascular:  Vessel Right Left  Radial Palpable Palpable  Dorsalis Pedis  Not Palpable  Posterior Tibial  Not Palpable   Gastrointestinal: soft, non-distended. No guarding/no peritoneal signs.  Musculoskeletal: M/S 5/5 throughout.    Right below-knee amputation Neurologic: Pain and light touch intact in extremities.  Symmetrical.  Speech is fluent. Motor exam as listed above. Psychiatric: Judgment intact, Mood & affect appropriate for pt's clinical situation. Dermatologic: No Venous rashes. No Ulcers Noted.  No changes consistent with cellulitis. Lymph : No Cervical lymphadenopathy, no lichenification or skin changes of chronic lymphedema.       ASSESSMENT AND PLAN:  1. PAD (peripheral artery disease) (HCC) Discussed the change in the patient's noninvasive studies.  Due to the fact that the patient has a previous amputation from diabetic ulcer, I suggested the more aggressive approach to proceed with  angiogram however the patient does not wish to proceed at this time we will have the patient return in 3 months for noninvasive studies.  The patient is also advised  that if certain symptoms present themselves such as discoloration of the toes, wounds or ulcerations, rest pain or constant severe pain, the patient should contact our office to be seen sooner. - VAS [place] ABI WITH/WO TBI; Future - VAS [place] LOWER EXTREMITY ARTERIAL DUPLEX; Future  2. DDD (degenerative disc disease), cervical Continue NSAID medications as already ordered, these medications have been reviewed and there are no changes at this time.  Continued activity and therapy was stressed.   3. Peripheral edema Patient's edema is doing much better with elevation and compression socks.  We will continue with conservative therapy.   Current Outpatient Medications on File Prior to Visit  Medication Sig Dispense Refill  . albuterol (VENTOLIN HFA) 108 (90 Base) MCG/ACT inhaler Inhale into the lungs every 6 (six) hours as needed for wheezing or shortness of breath.    [name] aspirin EC 81 MG tablet Take 1 tablet (81 mg total) by mouth daily. 90 tablet 3  . atorvastatin (LIPITOR) 80 MG tablet Take 80 mg by mouth daily.    . calcium carbonate (TUMS - DOSED IN MG ELEMENTAL CALCIUM) 500 MG chewable tablet Chew 4 tablets by mouth daily as needed for indigestion or heartburn.    . cephALEXin (KEFLEX) 500 MG capsule Take 1 capsule (500 mg total) by mouth 2 (two) times daily. 10 capsule 3  . clopidogrel (PLAVIX) 75 MG tablet Take 75 mg by mouth daily.     . diphenhydrAMINE (BENADRYL) 25 MG tablet Take 25 mg by mouth daily as needed for allergies.    [name] ezetimibe (ZETIA) 10 MG tablet Take 10 mg by mouth daily.    . Insulin Glargine (LANTUS SOLOSTAR) 100 UNIT/ML Solostar Pen Inject 30 Units into the skin at bedtime. 15 mL 11  . insulin lispro (HUMALOG) 100 UNIT/ML injection Sliding Scale Insulin before meals using Novolog If BS is 0 to 70 no insulin and  eat/glucose tablets/glucose get If BS 71 to 150 great! If BS 151 to 200, 2 Units Novolog If BS 201 to 250, 4 Units Novolog If BS 251 to 300, 6 Units Novolog If BS 301 to 350, 8 Units Novolog If BS 351 to 400, 10 Units Novolog If BS greater than 400, take 10 Units and Contact our office during office hour    . ipratropium-albuterol (DUONEB) 0.5-2.5 (3) MG/3ML SOLN Inhale 3 mLs into the lungs every 6 (six) hours as needed (wheezing/sob).     . isosorbide mononitrate (IMDUR) 30 MG 24 hr tablet Take 1 tablet (30 mg total) by mouth daily. 30 tablet 0  . metFORMIN (GLUCOPHAGE-XR) 500 MG 24 hr tablet Take 1,000 mg by mouth 2 (two) times daily.     . Multiple Vitamin (MULTI-VITAMINS) TABS Take 1 tablet by mouth daily.     . multivitamin-lutein (OCUVITE-LUTEIN) CAPS capsule Take 1 capsule by mouth daily. 30 capsule 0  . naproxen (NAPROSYN) 500 MG tablet Take 1 tablet (500 mg total) by mouth 2 (two) times daily with a meal. 20 tablet 2  . nitroGLYCERIN (NITRODUR - DOSED IN MG/24 HR) 0.4 mg/hr patch Place 0.4 mg onto the skin daily.    . nitroGLYCERIN (NITROSTAT) 0.4 MG SL tablet Place 0.4 mg under the tongue every 5 (five) minutes as needed for chest pain.    . Oxycodone HCl 10 MG TABS Take 10 mg by mouth every 6 (six) hours as needed (pain).     . potassium chloride (K-DUR) 10 MEQ tablet Take 10 mEq by mouth daily.    [name]  promethazine (PHENERGAN) 25 MG tablet TAKE 1 TABLET BY MOUTH EVERY 6 HOURS AS NEEDED NAUSEA    . sacubitril-valsartan (ENTRESTO) 24-26 MG Take 1 tablet by mouth 2 (two) times daily. 60 tablet 3  . spironolactone (ALDACTONE) 25 MG tablet Take 25 mg by mouth daily.     [name] torsemide (DEMADEX) 20 MG tablet Take by mouth daily. Patient taking 40mg  Am/ 20mg  PM    . traZODone (DESYREL) 50 MG tablet Take 50 mg by mouth at bedtime as needed for sleep.     [name] gabapentin (NEURONTIN) 800 MG tablet Take 800 mg by mouth 4 (four) times daily.     . metoprolol succinate (TOPROL-XL) 25 MG 24 hr tablet Take 12.5  mg by mouth daily.      No current facility-administered medications on file prior to visit.     There are no Patient Instructions on file for this visit. No follow-ups on file.   Kris Hartmann, NP  This note was completed with Sales executive.  Any errors are purely unintentional.

## 2019-03-31 ENCOUNTER — Telehealth (INDEPENDENT_AMBULATORY_CARE_PROVIDER_SITE_OTHER): Payer: Self-pay

## 2019-03-31 NOTE — Telephone Encounter (Signed)
Patient left a message requesting the correct strength for compression stockings he was recommended to use. I spoke with Eulogio Ditch NP and she advise for the patient to use 20-3mmHg or if he could not get that size on he could use 15-68mmHg. Patient was made aware with medical advice and verbalized understanding.

## 2019-04-08 NOTE — Progress Notes (Deleted)
Patient ID: [name] [name], male    DOB: [birth year], 59 y.o.   MRN: [id number]  HPI  [name] [name] is a 58 y/o male with a history of obstructive sleep apnea, PVD, PAD, HTN, hyperlipidemia, DM, CAD, COPD, BPH, asthma, current tobacco use and chronic heart failure.   Echo report from 11/06/2018 reviewed and showed an EF of 20-25% along with moderate TR and a PA pressure of 59.6 mmHg. Echo report from 04/25/17 reviewed and shows an EF of 25-30% along with a PA pressure of 34 mm Hg. EF has declined from 55-60% back in 2016.   Cardiac catheterization done 04/28/17 showed significant three-vessel disease with a patient stent in the RCA and LAD. Significant proximal RCS disease and the stent as well as diffuse mid and distal disease. LAD has moderate disease. Severely elevated left ventricular end-diastolic pressure at 34 mmHg. Possible CABG in the future with optimizing medical management. Stress done in 2015.  Was in the ED 03/08/2019 due to groin swelling and testicle pain where he was treated and released. Admitted 11/05/2018 due to acute on chronic HF. Cardiology consult obtained. Aggressively diuresed and discharged after 4 days.   He presents today for a follow-up visit with a chief complaint of  Past Medical History:  Diagnosis Date  . Arthritis   . Asthma   . Atherosclerosis   . BPH (benign prostatic hyperplasia)   . Carotid arterial disease ([place])   . Charcot's joint of foot, right   . Chronic combined systolic (congestive) and diastolic (congestive) heart failure ([place])    a. 02/2013 EF 50% by LV gram; b. 04/2017 Echo: EF 25-30%. diff HK. Gr2 DD. Mod dil LA/RV. PASP 67mmHg.  [name] COPD (chronic obstructive pulmonary disease) ([place])   . Coronary artery disease    a. 2013 S/P PCI of LAD [facility]);  b. 03/2013 PCI: RCA 90p ( 2.5 x 23 mm DES); c. 02/2014 Cath: patent RCA stent->Med Rx; d. 04/2017 Cath: LM nl, LAd 30ost/p, 63m/d, D1 80ost, LCX 90p/m, OM1 85, RCA 70/20p, 12m, 70d->Referred for CT Surg-felt to be  poor candidate.  . Diabetic neuropathy ([place])   . Diabetic ulcer of right foot ([place])   . Hernia   . Hyperlipidemia   . Hypertension   . Ischemic cardiomyopathy    a. 04/2017 Echo: EF 25-30%; b. 08/2017 Cardiac MRI ([facility]): EF 19%, sev glob HK. RVEF 20%, mod BAE, triv [name], mild-mod TR. Basal lateral subendocardial infarct (viable), inf/infsept, dist septal ischemia, basal to mid lat peri-infarct ischemia.  . Morbid obesity ([place])   . Neuropathy   . PAD (peripheral artery disease) ([place])    a. Followed by Dr. Lucky Cowboy; b. 08/2010 Periph Angio: RSFA 70-80p (6X60 self-expanding stent); c. 09/2010 Periph Angio: L SFA 100p (7x unknown length self-expanding stent); d. 06/2015 Periph Angio: R SFA short segment occlusion (6x12 self-expanding stent).  . Restless leg syndrome   . Sleep apnea   . Subclavian artery stenosis, right ([place])   . Syncope and collapse   . Tobacco use    a. 75+ yr hx - still smoking 1ppd, down from 2 ppd.  . Type II diabetes mellitus ([place])   . Varicose vein    Past Surgical History:  Procedure Laterality Date  . ABDOMINAL AORTAGRAM N/A 06/23/2013   Procedure: ABDOMINAL Maxcine Ham;  Surgeon: Wellington Hampshire, MD;  Location: [place] CATH LAB;  Service: Cardiovascular;  Laterality: N/A;  . AMPUTATION Right 04/01/2018   Procedure: AMPUTATION BELOW KNEE;  Surgeon: Algernon Huxley,  MD;  Location: [facility] ORS;  Service: Vascular;  Laterality: Right;  . APPENDECTOMY    . CARDIAC CATHETERIZATION  10/14   [facility]; X1 STENT PROXIMAL RCA  . CARDIAC CATHETERIZATION  02/2010   [facility]  . [facility]  [facility]  . CLAVICLE SURGERY    . HERNIA REPAIR    . IRRIGATION AND DEBRIDEMENT FOOT Right 01/04/2018   Procedure: IRRIGATION AND DEBRIDEMENT FOOT;  Surgeon: Samara Deist, DPM;  Location: [facility] ORS;  Service: Podiatry;  Laterality: Right;  . LEFT HEART CATH AND CORS/GRAFTS ANGIOGRAPHY N/A 04/28/2017   Procedure: LEFT HEART CATH AND CORONARY ANGIOGRAPHY;  Surgeon: Wellington Hampshire, MD;  Location:  [place] CV LAB;  Service: Cardiovascular;  Laterality: N/A;  . LOWER EXTREMITY ANGIOGRAPHY Right 03/03/2018   Procedure: LOWER EXTREMITY ANGIOGRAPHY;  Surgeon: Katha Cabal, MD;  Location: [place] CV LAB;  Service: Cardiovascular;  Laterality: Right;  . LOWER EXTREMITY ANGIOGRAPHY Left 08/24/2018   Procedure: LOWER EXTREMITY ANGIOGRAPHY;  Surgeon: Algernon Huxley, MD;  Location: [place] CV LAB;  Service: Cardiovascular;  Laterality: Left;  . PERIPHERAL ARTERIAL STENT GRAFT     x2 left/right  . PERIPHERAL VASCULAR BALLOON ANGIOPLASTY Right 01/06/2018   Procedure: PERIPHERAL VASCULAR BALLOON ANGIOPLASTY;  Surgeon: Katha Cabal, MD;  Location: [place] CV LAB;  Service: Cardiovascular;  Laterality: Right;  . PERIPHERAL VASCULAR CATHETERIZATION N/A 07/06/2015   Procedure: Abdominal Aortogram w/Lower Extremity;  Surgeon: Algernon Huxley, MD;  Location: [place] CV LAB;  Service: Cardiovascular;  Laterality: N/A;  . PERIPHERAL VASCULAR CATHETERIZATION  07/06/2015   Procedure: Lower Extremity Intervention;  Surgeon: Algernon Huxley, MD;  Location: [place] CV LAB;  Service: Cardiovascular;;   Family History  Problem Relation Age of Onset  . Heart attack Father   . Heart disease Father   . Hypertension Father   . Hyperlipidemia Father   . Hypertension Mother   . Hyperlipidemia Mother    Social History   Tobacco Use  . Smoking status: Current Every Day Smoker    Packs/day: 1.00    Years: 41.00    Pack years: 41.00    Types: Cigarettes  . Smokeless tobacco: Never Used  Substance Use Topics  . Alcohol use: No   Allergies  Allergen Reactions  . Dulaglutide Anaphylaxis, Diarrhea and Hives  . Other Itching    Skin itching associated with nitro patch  . Prednisone Rash      Review of Systems  Constitutional: Positive for fatigue (easily). Negative for appetite change.  HENT: Negative for congestion, postnasal drip and sore throat.   Eyes: Negative.    Respiratory: Positive for shortness of breath (with minimal exertion). Negative for cough, chest tightness and wheezing.   Cardiovascular: Positive for leg swelling (better). Negative for chest pain and palpitations.  Gastrointestinal: Negative for abdominal distention and abdominal pain.  Endocrine: Negative.   Genitourinary: Negative.   Musculoskeletal: Positive for back pain. Negative for neck pain.       Has right lower leg prosthesis  Skin: Negative.   Allergic/Immunologic: Negative.   Neurological: Negative for dizziness and light-headedness.  Hematological: Negative for adenopathy. Bruises/bleeds easily.  Psychiatric/Behavioral: Positive for sleep disturbance (sleeping on 2 pillows). Negative for dysphoric mood. The patient is not nervous/anxious.      Physical Exam  Constitutional: He is oriented to person, place, and time. He appears well-developed and well-nourished.  HENT:  Head: Normocephalic and atraumatic.  Neck: Normal range of motion. Neck supple. No  JVD present.  Cardiovascular: Regular rhythm. Tachycardia present.  Pulmonary/Chest: Effort normal. He has no wheezes. He has no rhonchi. He has no rales.  Abdominal: Soft. He exhibits no distension. There is no abdominal tenderness.  Musculoskeletal:        General: Edema (3+ pitting edema in left lower leg to knee) present. No tenderness.  Neurological: He is alert and oriented to person, place, and time.  Skin: Skin is warm and dry.  Psychiatric: He has a normal mood and affect. His behavior is normal. Thought content normal.  Nursing note and vitals reviewed.   Assessment & Plan:  1: Chronic heart failure with reduced ejection fraction- - NYHA class III - mildly fluid overloaded with pitting edema - weighing daily and he was reminded to call for an overnight weight gain of >2 pounds or a weekly weight gain of >5 pounds - weight 212 pounds from last visit here 2 1/2 months ago - will check BMP today since he's  been taking entresto 24/26mg  BID - he says that he's waiting on a BP cuff so that he can check it daily; he's to call us if he begins to notice any dizziness - not adding salt and has been using Mrs. Dash & trying to read food labels.  - has [place] - drinking ~ 64 ounces of fluid daily (Mtn Dew, pepsi, water) - had telemedicine visit with cardiologist Rockey Situ) 12/14/2018 - BNP 02/26/2019 was 2261.0  2: HTN- - BP  - saw PCP Vickki Muff) 02/16/2019 - BMP done 02/26/2019 reviewed and shows sodium 131, potassium 4.3, creatinine 0.97 and GFR >60  3: Cellulitis- - left lateral lower leg has minimal redness - still taking his antibiotics  4: Tobacco use- - currently smoking <1/2 ppd of cigarettes & has no desire to quit - complete cessation discussed for 3 minutes with him  5: Lymphedema- - stage 2 - trying to elevate his leg some during the day (has right leg prosthesis) - has compression type sock on but edema persists - limited in his ability to exercise due to shortness of breath - consider lymphapress compression boot if edema persists after making medication changes per above  Patient did not bring his medications nor a list. Each medication was verbally reviewed with the patient and he was encouraged to bring the bottles to every visit to confirm accuracy of list.

## 2019-04-09 ENCOUNTER — Other Ambulatory Visit: Payer: Self-pay

## 2019-04-09 ENCOUNTER — Ambulatory Visit: Payer: Medicare Other | Admitting: Family

## 2019-04-13 ENCOUNTER — Telehealth: Payer: Self-pay | Admitting: Family

## 2019-04-13 NOTE — Telephone Encounter (Signed)
Patient had an appointment at the [facility] on 04/09/2019 but didn't get registered until almost 25 minutes after his appointment time. Advised patient that I would be unable to see him that day since it was so late. Patient did not want to R/S at that time.  Called patient on 04/13/2019 and he said he would be glad to R/S the appointment but wanted to push it out a couple of weeks as he has an appointment already scheduled for tomorrow. Made his appointment for April 29, 2019 at 2:00pm.

## 2019-04-20 ENCOUNTER — Encounter (INDEPENDENT_AMBULATORY_CARE_PROVIDER_SITE_OTHER): Payer: Medicare Other

## 2019-04-20 ENCOUNTER — Ambulatory Visit (INDEPENDENT_AMBULATORY_CARE_PROVIDER_SITE_OTHER): Payer: Medicare Other | Admitting: Vascular Surgery

## 2019-04-29 ENCOUNTER — Ambulatory Visit: Payer: Medicare Other | Admitting: Family

## 2019-05-16 NOTE — Progress Notes (Addendum)
Patient ID: [name] [name], male    DOB: 19-Apr-1961, 58 y.o.   MRN: [id number]  HPI  Mr Piwowar is a 58 y/o male with a history of obstructive sleep apnea, PVD, PAD, HTN, hyperlipidemia, DM, CAD, COPD, BPH, asthma, current tobacco use and chronic heart failure.   Echo report from 11/06/2018 reviewed and showed an EF of 20-25% along with moderate TR and a PA pressure of 59.6 mmHg. Echo report from 04/25/17 reviewed and shows an EF of 25-30% along with a PA pressure of 34 mm Hg. EF has declined from 55-60% back in 2016.   Cardiac catheterization done 04/28/17 showed significant three-vessel disease with a patient stent in the RCA and LAD. Significant proximal RCS disease and the stent as well as diffuse mid and distal disease. LAD has moderate disease. Severely elevated left ventricular end-diastolic pressure at 34 mmHg. Possible CABG in the future with optimizing medical management. Stress done in 2015.  Was in the ED 03/08/2019 due to epididymitis where he was treated and released.   He presents today for a follow-up visit with a chief complaint of moderate shortness of breath upon minimal exertion. He describes this as chronic in nature having been present for several years although it has worsened over the last few days. He has associated fatigue, chest tightness, cough, left leg swelling, easy bruising, difficulty sleeping and slight weight gain along with this. He denies any dizziness, abdominal distention, palpitations, chest pain or wheezing. He has also noticed that his left lower leg is getting red and feeling warm again.     Past Medical History:  Diagnosis Date  . Arthritis   . Asthma   . Atherosclerosis   . BPH (benign prostatic hyperplasia)   . Carotid arterial disease ([place])   . Charcot's joint of foot, right   . Chronic combined systolic (congestive) and diastolic (congestive) heart failure ([place])    a. 02/2013 EF 50% by LV gram; b. 04/2017 Echo: EF 25-30%. diff HK. Gr2 DD. Mod dil  LA/RV. PASP 15mmHg.  [name] COPD (chronic obstructive pulmonary disease) ([place])   . Coronary artery disease    a. 2013 S/P PCI of LAD [facility]);  b. 03/2013 PCI: RCA 90p ( 2.5 x 23 mm DES); c. 02/2014 Cath: patent RCA stent->Med Rx; d. 04/2017 Cath: LM nl, LAd 30ost/p, 43m/d, D1 80ost, LCX 90p/m, OM1 85, RCA 70/20p, 16m, 70d->Referred for CT Surg-felt to be poor candidate.  . Diabetic neuropathy ([place])   . Diabetic ulcer of right foot ([place])   . Hernia   . Hyperlipidemia   . Hypertension   . Ischemic cardiomyopathy    a. 04/2017 Echo: EF 25-30%; b. 08/2017 Cardiac MRI ([facility]): EF 19%, sev glob HK. RVEF 20%, mod BAE, triv MR, mild-mod TR. Basal lateral subendocardial infarct (viable), inf/infsept, dist septal ischemia, basal to mid lat peri-infarct ischemia.  . Morbid obesity ([place])   . Neuropathy   . PAD (peripheral artery disease) ([place])    a. Followed by Dr. Lucky Cowboy; b. 08/2010 Periph Angio: RSFA 70-80p (6X60 self-expanding stent); c. 09/2010 Periph Angio: L SFA 100p (7x unknown length self-expanding stent); d. 06/2015 Periph Angio: R SFA short segment occlusion (6x12 self-expanding stent).  . Restless leg syndrome   . Sleep apnea   . Subclavian artery stenosis, right ([place])   . Syncope and collapse   . Tobacco use    a. 75+ yr hx - still smoking 1ppd, down from 2 ppd.  . Type II diabetes mellitus ([place])   .  Varicose vein    Past Surgical History:  Procedure Laterality Date  . ABDOMINAL AORTAGRAM N/A 06/23/2013   Procedure: ABDOMINAL Maxcine Ham;  Surgeon: Wellington Hampshire, MD;  Location: [place] CATH LAB;  Service: Cardiovascular;  Laterality: N/A;  . AMPUTATION Right 04/01/2018   Procedure: AMPUTATION BELOW KNEE;  Surgeon: Algernon Huxley, MD;  Location: [facility] ORS;  Service: Vascular;  Laterality: Right;  . APPENDECTOMY    . CARDIAC CATHETERIZATION  10/14   [facility]; X1 STENT PROXIMAL RCA  . CARDIAC CATHETERIZATION  02/2010   [facility]  . CARDIAC CAT[facility]IZATION  [facility]/21/2014   [facility]  . CLAVICLE SURGERY    . HERNIA REPAIR     . IRRIGATION AND DEBRIDEMENT FOOT Right 01/04/2018   Procedure: IRRIGATION AND DEBRIDEMENT FOOT;  Surgeon: Samara Deist, DPM;  Location: [facility] ORS;  Service: Podiatry;  Laterality: Right;  . LEFT HEART CATH AND CORS/GRAFTS ANGIOGRAPHY N/A 04/28/2017   Procedure: LEFT HEART CATH AND CORONARY ANGIOGRAPHY;  Surgeon: Wellington Hampshire, MD;  Location: [place] CV LAB;  Service: Cardiovascular;  Laterality: N/A;  . LOWER EXTREMITY ANGIOGRAPHY Right 03/03/2018   Procedure: LOWER EXTREMITY ANGIOGRAPHY;  Surgeon: Katha Cabal, MD;  Location: [place] CV LAB;  Service: Cardiovascular;  Laterality: Right;  . LOWER EXTREMITY ANGIOGRAPHY Left 08/24/2018   Procedure: LOWER EXTREMITY ANGIOGRAPHY;  Surgeon: Algernon Huxley, MD;  Location: [place] CV LAB;  Service: Cardiovascular;  Laterality: Left;  . PERIPHERAL ARTERIAL STENT GRAFT     x2 left/right  . PERIPHERAL VASCULAR BALLOON ANGIOPLASTY Right 01/06/2018   Procedure: PERIPHERAL VASCULAR BALLOON ANGIOPLASTY;  Surgeon: Katha Cabal, MD;  Location: [place] CV LAB;  Service: Cardiovascular;  Laterality: Right;  . PERIPHERAL VASCULAR CATHETERIZATION N/A 07/06/2015   Procedure: Abdominal Aortogram w/Lower Extremity;  Surgeon: Algernon Huxley, MD;  Location: [place] CV LAB;  Service: Cardiovascular;  Laterality: N/A;  . PERIPHERAL VASCULAR CATHETERIZATION  07/06/2015   Procedure: Lower Extremity Intervention;  Surgeon: Algernon Huxley, MD;  Location: [place] CV LAB;  Service: Cardiovascular;;   Family History  Problem Relation Age of Onset  . Heart attack Father   . Heart disease Father   . Hypertension Father   . Hyperlipidemia Father   . Hypertension Mother   . Hyperlipidemia Mother    Social History   Tobacco Use  . Smoking status: Current Every Day Smoker    Packs/day: 1.00    Years: 41.00    Pack years: 41.00    Types: Cigarettes  . Smokeless tobacco: Never Used  Substance Use Topics  . Alcohol use: No    Allergies  Allergen Reactions  . Dulaglutide Anaphylaxis, Diarrhea and Hives  . Other Itching    Skin itching associated with nitro patch  . Prednisone Rash   Prior to Admission medications   Medication Sig Start Date End Date Taking? Authorizing Provider  albuterol (VENTOLIN HFA) 108 (90 Base) MCG/ACT inhaler Inhale into the lungs every 6 (six) hours as needed for wheezing or shortness of breath.   Yes [provider]  aspirin EC 81 MG tablet Take 1 tablet (81 mg total) by mouth daily. 05/11/15  Yes Wellington Hampshire, MD  atorvastatin (LIPITOR) 80 MG tablet Take 80 mg by mouth daily.   Yes [provider]  calcium carbonate (TUMS - DOSED IN MG ELEMENTAL CALCIUM) 500 MG chewable tablet Chew 4 tablets by mouth daily as needed for indigestion or heartburn.   Yes [provider]  clopidogrel (PLAVIX) 75 MG  tablet Take 75 mg by mouth daily.  08/22/17  Yes [provider]  diphenhydrAMINE (BENADRYL) 25 MG tablet Take 25 mg by mouth daily as needed for allergies.   Yes [provider]  ezetimibe (ZETIA) 10 MG tablet Take 10 mg by mouth daily.   Yes [provider]  gabapentin (NEURONTIN) 800 MG tablet Take 800 mg by mouth 4 (four) times daily.  08/14/17 05/17/19 Yes [provider]  Insulin Glargine (LANTUS SOLOSTAR) 100 UNIT/ML Solostar Pen Inject 30 Units into the skin at bedtime. 04/07/18  Yes Mayo, Pete Pelt, MD  insulin lispro (HUMALOG) 100 UNIT/ML injection Sliding Scale Insulin before meals using Novolog If BS is 0 to 70 no insulin and eat/glucose tablets/glucose get If BS 71 to 150 great! If BS 151 to 200, 2 Units Novolog If BS 201 to 250, 4 Units Novolog If BS 251 to 300, 6 Units Novolog If BS 301 to 350, 8 Units Novolog If BS 351 to 400, 10 Units Novolog If BS greater than 400, take 10 Units and Contact our office during office hour 04/14/18  Yes [provider]  ipratropium-albuterol (DUONEB) 0.5-2.5 (3) MG/3ML SOLN Inhale  3 mLs into the lungs every 6 (six) hours as needed (wheezing/sob).    Yes [provider]  isosorbide mononitrate (IMDUR) 30 MG 24 hr tablet Take 1 tablet (30 mg total) by mouth daily. 11/10/18  Yes Dustin Flock, MD  metFORMIN (GLUCOPHAGE-XR) 500 MG 24 hr tablet Take 1,000 mg by mouth 2 (two) times daily.    Yes [provider]  metoprolol succinate (TOPROL-XL) 25 MG 24 hr tablet Take 12.5 mg by mouth daily.  03/19/18 05/17/19 Yes [provider]  Multiple Vitamin (MULTI-VITAMINS) TABS Take 1 tablet by mouth daily.    Yes [provider]  multivitamin-lutein (OCUVITE-LUTEIN) CAPS capsule Take 1 capsule by mouth daily. 01/09/18  Yes Demetrios Loll, MD  naproxen (NAPROSYN) 500 MG tablet Take 1 tablet (500 mg total) by mouth 2 (two) times daily with a meal. 03/08/19  Yes Lavonia Drafts, MD  nitroGLYCERIN (NITRODUR - DOSED IN MG/24 HR) 0.4 mg/hr patch Place 0.4 mg onto the skin daily.   Yes [provider]  nitroGLYCERIN (NITROSTAT) 0.4 MG SL tablet Place 0.4 mg under the tongue every 5 (five) minutes as needed for chest pain.   Yes [provider]  Oxycodone HCl 10 MG TABS Take 10 mg by mouth every 6 (six) hours as needed (pain).  01/15/18  Yes [provider]  potassium chloride (K-DUR) 10 MEQ tablet Take 10 mEq by mouth daily.   Yes [provider]  promethazine (PHENERGAN) 25 MG tablet TAKE 1 TABLET BY MOUTH EVERY 6 HOURS AS NEEDED NAUSEA 04/29/18  Yes [provider]  sacubitril-valsartan (ENTRESTO) 24-26 MG Take 1 tablet by mouth 2 (two) times daily. 03/01/19  Yes Hackney, Otila Kluver A, FNP  spironolactone (ALDACTONE) 25 MG tablet Take 25 mg by mouth daily.  03/02/18  Yes [provider]  torsemide (DEMADEX) 20 MG tablet Take by mouth daily. Patient taking 60mg  Am/ 20mg  PM   Yes [provider]  traZODone (DESYREL) 50 MG tablet Take 150 mg by mouth at bedtime as needed for sleep.  08/12/17  Yes [provider]      Review of Systems  Constitutional: Positive for fatigue (easily). Negative for appetite change.  HENT: Negative for congestion, postnasal drip and sore throat.   Eyes: Negative.   Respiratory: Positive for cough, chest tightness and shortness  of breath (with minimal exertion). Negative for wheezing.   Cardiovascular: Positive for leg swelling (left leg). Negative for chest pain and palpitations.  Gastrointestinal: Negative for abdominal distention and abdominal pain.  Endocrine: Negative.   Genitourinary: Negative.   Musculoskeletal: Positive for back pain. Negative for neck pain.       Has right lower leg prosthesis  Skin: Positive for color change (left lower leg is red).  Allergic/Immunologic: Negative.   Neurological: Negative for dizziness and light-headedness.  Hematological: Negative for adenopathy. Bruises/bleeds easily.  Psychiatric/Behavioral: Positive for sleep disturbance (sleeping on 2 pillows). Negative for dysphoric mood. The patient is not nervous/anxious.     Vitals:   05/17/19 1321  BP: (!) 123/97  Pulse: (!) 114  Resp: 20  SpO2: 98%  Weight: 216 lb 12.8 oz (98.3 kg)  Height: 5\' 7"  (1.702 m)   Wt Readings from Last 3 Encounters:  05/17/19 216 lb 12.8 oz (98.3 kg)  03/23/19 203 lb (92.1 kg)  03/08/19 205 lb (93 kg)   Lab Results  Component Value Date   CREATININE 0.97 02/26/2019   CREATININE 1.25 (H) 02/23/2019   CREATININE 1.28 (H) 11/26/2018    Physical Exam  Constitutional: He is oriented to person, place, and time. He appears well-developed and well-nourished.  HENT:  Head: Normocephalic and atraumatic.  Neck: Normal range of motion. Neck supple. No JVD present.  Cardiovascular: Regular rhythm. Tachycardia present.  Pulmonary/Chest: Effort normal. He has no wheezes. He has no rhonchi. He has no rales.  Abdominal: Soft. He exhibits distension. There is no abdominal tenderness.  Musculoskeletal:        General: Edema (2+ pitting edema in left  lower leg to knee) present. No tenderness.  Neurological: He is alert and oriented to person, place, and time.  Skin: Skin is warm and dry. There is erythema (left lower leg).  Intact blister noted on medial left ankle close to the heel  Psychiatric: He has a normal mood and affect. His behavior is normal. Thought content normal.  Nursing note and vitals reviewed.   Assessment & Plan:  1: Acute on Chronic heart failure with reduced ejection fraction- - NYHA class III - moderately fluid overloaded with pitting edema, weight gain and abdominal distention - weighing daily and he was reminded to call for an overnight weight gain of >2 pounds or a weekly weight gain of >5 pounds - weight up 4 pounds from last visit here 3 months ago - will send for 80mg  IV lasix/ 26meq PO potasium today - will draw BMP/BNP today - not adding salt and has been using Mrs. Dash & trying to read food labels.  - has [place] - drinking ~ 64 ounces of fluid daily (Mtn Dew, pepsi, water) - had telemedicine visit with cardiologist Rockey Situ) 12/14/2018 - BNP 02/26/2019 was 2261.0 - says that he does not take the flu vaccine; good handwashing encouraged  2: HTN- - BP mildly elevated today - saw PCP Vickki Muff) 02/16/2019 - BMP done 02/26/2019 reviewed and shows sodium 131, potassium 4.3, creatinine 0.97 and GFR >60  3: Tobacco use- - currently smoking ~ 3 cigarettes daily - complete cessation discussed for 3 minutes with him  4: Cellulitis of left lower leg- - left lower leg warm - will treat with keflex 500mg  TID  Patient did not bring his medications nor a list. Each medication was verbally reviewed with the patient and he was encouraged to bring the bottles to every visit to confirm accuracy of list.  Return in 2 days for a recheck of symptoms. Sooner if needed.

## 2019-05-17 ENCOUNTER — Ambulatory Visit: Payer: Medicare Other | Admitting: Family

## 2019-05-17 ENCOUNTER — Other Ambulatory Visit: Payer: Self-pay | Admitting: Family

## 2019-05-17 ENCOUNTER — Ambulatory Visit
Admission: RE | Admit: 2019-05-17 | Discharge: 2019-05-17 | Disposition: A | Discharge status: Home / Self Care | Payer: Medicare Other | Source: Ambulatory Visit | Attending: Family | Admitting: Family

## 2019-05-17 ENCOUNTER — Other Ambulatory Visit: Payer: Self-pay

## 2019-05-17 ENCOUNTER — Encounter: Payer: Self-pay | Admitting: Family

## 2019-05-17 VITALS — BP 123/97 | HR 114 | Resp 20 | Ht 67.0 in | Wt 216.8 lb

## 2019-05-17 DIAGNOSIS — E11621 Type 2 diabetes mellitus with foot ulcer: Secondary | ICD-10-CM | POA: Diagnosis not present

## 2019-05-17 DIAGNOSIS — Z79899 Other long term (current) drug therapy: Secondary | ICD-10-CM | POA: Diagnosis not present

## 2019-05-17 DIAGNOSIS — J449 Chronic obstructive pulmonary disease, unspecified: Secondary | ICD-10-CM | POA: Diagnosis not present

## 2019-05-17 DIAGNOSIS — I252 Old myocardial infarction: Secondary | ICD-10-CM | POA: Insufficient documentation

## 2019-05-17 DIAGNOSIS — F17210 Nicotine dependence, cigarettes, uncomplicated: Secondary | ICD-10-CM | POA: Insufficient documentation

## 2019-05-17 DIAGNOSIS — E785 Hyperlipidemia, unspecified: Secondary | ICD-10-CM | POA: Diagnosis not present

## 2019-05-17 DIAGNOSIS — E1140 Type 2 diabetes mellitus with diabetic neuropathy, unspecified: Secondary | ICD-10-CM | POA: Diagnosis not present

## 2019-05-17 DIAGNOSIS — Z89511 Acquired absence of right leg below knee: Secondary | ICD-10-CM | POA: Diagnosis not present

## 2019-05-17 DIAGNOSIS — L03116 Cellulitis of left lower limb: Secondary | ICD-10-CM | POA: Insufficient documentation

## 2019-05-17 DIAGNOSIS — R140 Abdominal distension (gaseous): Secondary | ICD-10-CM | POA: Diagnosis not present

## 2019-05-17 DIAGNOSIS — I255 Ischemic cardiomyopathy: Secondary | ICD-10-CM | POA: Insufficient documentation

## 2019-05-17 DIAGNOSIS — Z6833 Body mass index (BMI) 33.0-33.9, adult: Secondary | ICD-10-CM | POA: Insufficient documentation

## 2019-05-17 DIAGNOSIS — I5042 Chronic combined systolic (congestive) and diastolic (congestive) heart failure: Secondary | ICD-10-CM | POA: Insufficient documentation

## 2019-05-17 DIAGNOSIS — G4733 Obstructive sleep apnea (adult) (pediatric): Secondary | ICD-10-CM | POA: Diagnosis not present

## 2019-05-17 DIAGNOSIS — Z955 Presence of coronary angioplasty implant and graft: Secondary | ICD-10-CM | POA: Insufficient documentation

## 2019-05-17 DIAGNOSIS — Z7902 Long term (current) use of antithrombotics/antiplatelets: Secondary | ICD-10-CM | POA: Diagnosis not present

## 2019-05-17 DIAGNOSIS — Z7982 Long term (current) use of aspirin: Secondary | ICD-10-CM | POA: Diagnosis not present

## 2019-05-17 DIAGNOSIS — I110 Hypertensive heart disease with heart failure: Secondary | ICD-10-CM | POA: Diagnosis present

## 2019-05-17 DIAGNOSIS — I5023 Acute on chronic systolic (congestive) heart failure: Secondary | ICD-10-CM

## 2019-05-17 DIAGNOSIS — R600 Localized edema: Secondary | ICD-10-CM | POA: Insufficient documentation

## 2019-05-17 DIAGNOSIS — Z794 Long term (current) use of insulin: Secondary | ICD-10-CM | POA: Diagnosis not present

## 2019-05-17 DIAGNOSIS — Z8349 Family history of other endocrine, nutritional and metabolic diseases: Secondary | ICD-10-CM | POA: Insufficient documentation

## 2019-05-17 DIAGNOSIS — Z8249 Family history of ischemic heart disease and other diseases of the circulatory system: Secondary | ICD-10-CM | POA: Insufficient documentation

## 2019-05-17 DIAGNOSIS — I2510 Atherosclerotic heart disease of native coronary artery without angina pectoris: Secondary | ICD-10-CM | POA: Diagnosis not present

## 2019-05-17 DIAGNOSIS — Z791 Long term (current) use of non-steroidal anti-inflammatories (NSAID): Secondary | ICD-10-CM | POA: Insufficient documentation

## 2019-05-17 DIAGNOSIS — M1990 Unspecified osteoarthritis, unspecified site: Secondary | ICD-10-CM | POA: Insufficient documentation

## 2019-05-17 DIAGNOSIS — E1151 Type 2 diabetes mellitus with diabetic peripheral angiopathy without gangrene: Secondary | ICD-10-CM | POA: Diagnosis not present

## 2019-05-17 DIAGNOSIS — Z888 Allergy status to other drugs, medicaments and biological substances status: Secondary | ICD-10-CM | POA: Insufficient documentation

## 2019-05-17 DIAGNOSIS — Z720 Tobacco use: Secondary | ICD-10-CM

## 2019-05-17 DIAGNOSIS — I10 Essential (primary) hypertension: Secondary | ICD-10-CM

## 2019-05-17 LAB — BASIC METABOLIC PANEL
Anion gap: 12 (ref 5–15)
BUN: 15 mg/dL (ref 6–20)
CO2: 29 mmol/L (ref 22–32)
Calcium: 8.9 mg/dL (ref 8.9–10.3)
Chloride: 91 mmol/L — ABNORMAL LOW (ref 98–111)
Creatinine, Ser: 1 mg/dL (ref 0.61–1.24)
GFR calc Af Amer: >60 mL/min (ref >60)
GFR calc non Af Amer: >60 mL/min (ref >60)
Glucose, Bld: 185 mg/dL — ABNORMAL HIGH (ref 70–99)
Potassium: 3.9 mmol/L (ref 3.5–5.1)
Sodium: 132 mmol/L — ABNORMAL LOW (ref 135–145)

## 2019-05-17 LAB — BRAIN NATRIURETIC PEPTIDE: B Natriuretic Peptide: 1920 pg/mL — ABNORMAL HIGH (ref 0.0–100.0)

## 2019-05-17 MED ORDER — FUROSEMIDE 10 MG/ML IJ SOLN
80.0000 mg | Freq: Once | INTRAMUSCULAR | Status: AC
  Administered 2019-05-17: 80 mg via INTRAVENOUS

## 2019-05-17 MED ORDER — POTASSIUM CHLORIDE CRYS ER 20 MEQ PO TBCR
40.0000 meq | EXTENDED_RELEASE_TABLET | Freq: Once | ORAL | Status: AC
  Administered 2019-05-17: 40 meq via ORAL

## 2019-05-17 MED ORDER — CEPHALEXIN 500 MG PO CAPS: 500.0000 mg | ORAL_CAPSULE | Freq: Three times a day (TID) | ORAL | 0 refills | Status: DC

## 2019-05-17 NOTE — Patient Instructions (Signed)
Continue weighing daily and call for an overnight weight gain of > 2 pounds or a weekly weight gain of >5 pounds. 

## 2019-05-18 ENCOUNTER — Telehealth (INDEPENDENT_AMBULATORY_CARE_PROVIDER_SITE_OTHER): Payer: Self-pay

## 2019-05-18 ENCOUNTER — Encounter: Payer: Self-pay | Admitting: Family

## 2019-05-18 ENCOUNTER — Telehealth (INDEPENDENT_AMBULATORY_CARE_PROVIDER_SITE_OTHER): Payer: Self-pay | Admitting: Vascular Surgery

## 2019-05-18 NOTE — Telephone Encounter (Signed)
Patient scheduled for angio on 05/24/2019 with Dr. Lucky Cowboy.

## 2019-05-18 NOTE — Telephone Encounter (Signed)
Spoke with the [name] [name] and he is now scheduled with Dr. Lucky Cowboy for left leg angio on 05/24/2019 with a 11:15 am arrival time to the [facility]. [name] [name] will do covid testing on 05/20/2019 between 12:30-2:30 pm at the [place]. Pre-procedure instructions were discussed and will be mailed to the [name] [name].

## 2019-05-18 NOTE — Telephone Encounter (Signed)
Last seen 03/23/2019

## 2019-05-18 NOTE — Progress Notes (Deleted)
Patient ID: [name] [name], male    DOB: 05/17/1961, 58 y.o.   MRN: [id number]  HPI  Mr Huettner is a 58 y/o male with a history of obstructive sleep apnea, PVD, PAD, HTN, hyperlipidemia, DM, CAD, COPD, BPH, asthma, current tobacco use and chronic heart failure.   Echo report from 11/06/2018 reviewed and showed an EF of 20-25% along with moderate TR and a PA pressure of 59.6 mmHg. Echo report from 04/25/17 reviewed and shows an EF of 25-30% along with a PA pressure of 34 mm Hg. EF has declined from 55-60% back in 2016.   Cardiac catheterization done 04/28/17 showed significant three-vessel disease with a patient stent in the RCA and LAD. Significant proximal RCS disease and the stent as well as diffuse mid and distal disease. LAD has moderate disease. Severely elevated left ventricular end-diastolic pressure at 34 mmHg. Possible CABG in the future with optimizing medical management. Stress done in 2015.  Was in the ED 03/08/2019 due to epididymitis where he was treated and released.   He presents today for a follow-up visit with a chief complaint of   Received 80mg  IV lasix/ 72meq PO potassium 2 days ago.   Past Medical History:  Diagnosis Date  . Arthritis   . Asthma   . Atherosclerosis   . BPH (benign prostatic hyperplasia)   . Carotid arterial disease ([place])   . Charcot's joint of foot, right   . Chronic combined systolic (congestive) and diastolic (congestive) heart failure ([place])    a. 02/2013 EF 50% by LV gram; b. 04/2017 Echo: EF 25-30%. diff HK. Gr2 DD. Mod dil LA/RV. PASP 43mmHg.  [name] COPD (chronic obstructive pulmonary disease) ([place])   . Coronary artery disease    a. 2013 S/P PCI of LAD [facility]);  b. 03/2013 PCI: RCA 90p ( 2.5 x 23 mm DES); c. 02/2014 Cath: patent RCA stent->Med Rx; d. 04/2017 Cath: LM nl, LAd 30ost/p, 61m/d, D1 80ost, LCX 90p/m, OM1 85, RCA 70/20p, 2m, 70d->Referred for CT Surg-felt to be poor candidate.  . Diabetic neuropathy ([place])   . Diabetic ulcer of right foot ([place])    . Hernia   . Hyperlipidemia   . Hypertension   . Ischemic cardiomyopathy    a. 04/2017 Echo: EF 25-30%; b. 08/2017 Cardiac MRI ([facility]): EF 19%, sev glob HK. RVEF 20%, mod BAE, triv MR, mild-mod TR. Basal lateral subendocardial infarct (viable), inf/infsept, dist septal ischemia, basal to mid lat peri-infarct ischemia.  . Morbid obesity ([place])   . Neuropathy   . PAD (peripheral artery disease) ([place])    a. Followed by Dr. Lucky Cowboy; b. 08/2010 Periph Angio: RSFA 70-80p (6X60 self-expanding stent); c. 09/2010 Periph Angio: L SFA 100p (7x unknown length self-expanding stent); d. 06/2015 Periph Angio: R SFA short segment occlusion (6x12 self-expanding stent).  . Restless leg syndrome   . Sleep apnea   . Subclavian artery stenosis, right ([place])   . Syncope and collapse   . Tobacco use    a. 75+ yr hx - still smoking 1ppd, down from 2 ppd.  . Type II diabetes mellitus ([place])   . Varicose vein    Past Surgical History:  Procedure Laterality Date  . ABDOMINAL AORTAGRAM N/A 06/23/2013   Procedure: ABDOMINAL Maxcine Ham;  Surgeon: Wellington Hampshire, MD;  Location: [place] CATH LAB;  Service: Cardiovascular;  Laterality: N/A;  . AMPUTATION Right 04/01/2018   Procedure: AMPUTATION BELOW KNEE;  Surgeon: Algernon Huxley, MD;  Location: [facility] ORS;  Service: Vascular;  Laterality: Right;  . APPENDECTOMY    . CARDIAC CATHETERIZATION  10/14   [facility]; X1 STENT PROXIMAL RCA  . CARDIAC CATHETERIZATION  02/2010   [facility]  . [facility]  [facility]  . CLAVICLE SURGERY    . HERNIA REPAIR    . IRRIGATION AND DEBRIDEMENT FOOT Right 01/04/2018   Procedure: IRRIGATION AND DEBRIDEMENT FOOT;  Surgeon: Samara Deist, DPM;  Location: [facility] ORS;  Service: Podiatry;  Laterality: Right;  . LEFT HEART CATH AND CORS/GRAFTS ANGIOGRAPHY N/A 04/28/2017   Procedure: LEFT HEART CATH AND CORONARY ANGIOGRAPHY;  Surgeon: Wellington Hampshire, MD;  Location: [place] CV LAB;  Service: Cardiovascular;  Laterality: N/A;  . LOWER EXTREMITY  ANGIOGRAPHY Right 03/03/2018   Procedure: LOWER EXTREMITY ANGIOGRAPHY;  Surgeon: Katha Cabal, MD;  Location: [place] CV LAB;  Service: Cardiovascular;  Laterality: Right;  . LOWER EXTREMITY ANGIOGRAPHY Left 08/24/2018   Procedure: LOWER EXTREMITY ANGIOGRAPHY;  Surgeon: Algernon Huxley, MD;  Location: [place] CV LAB;  Service: Cardiovascular;  Laterality: Left;  . PERIPHERAL ARTERIAL STENT GRAFT     x2 left/right  . PERIPHERAL VASCULAR BALLOON ANGIOPLASTY Right 01/06/2018   Procedure: PERIPHERAL VASCULAR BALLOON ANGIOPLASTY;  Surgeon: Katha Cabal, MD;  Location: [place] CV LAB;  Service: Cardiovascular;  Laterality: Right;  . PERIPHERAL VASCULAR CATHETERIZATION N/A 07/06/2015   Procedure: Abdominal Aortogram w/Lower Extremity;  Surgeon: Algernon Huxley, MD;  Location: [place] CV LAB;  Service: Cardiovascular;  Laterality: N/A;  . PERIPHERAL VASCULAR CATHETERIZATION  07/06/2015   Procedure: Lower Extremity Intervention;  Surgeon: Algernon Huxley, MD;  Location: [place] CV LAB;  Service: Cardiovascular;;   Family History  Problem Relation Age of Onset  . Heart attack Father   . Heart disease Father   . Hypertension Father   . Hyperlipidemia Father   . Hypertension Mother   . Hyperlipidemia Mother    Social History   Tobacco Use  . Smoking status: Current Every Day Smoker    Packs/day: 1.00    Years: 41.00    Pack years: 41.00    Types: Cigarettes  . Smokeless tobacco: Never Used  Substance Use Topics  . Alcohol use: No   Allergies  Allergen Reactions  . Dulaglutide Anaphylaxis, Diarrhea and Hives  . Other Itching    Skin itching associated with nitro patch  . Prednisone Rash      Review of Systems  Constitutional: Positive for fatigue (easily). Negative for appetite change.  HENT: Negative for congestion, postnasal drip and sore throat.   Eyes: Negative.   Respiratory: Positive for cough, chest tightness and shortness of breath (with minimal  exertion). Negative for wheezing.   Cardiovascular: Positive for leg swelling (left leg). Negative for chest pain and palpitations.  Gastrointestinal: Negative for abdominal distention and abdominal pain.  Endocrine: Negative.   Genitourinary: Negative.   Musculoskeletal: Positive for back pain. Negative for neck pain.       Has right lower leg prosthesis  Skin: Positive for color change (left lower leg is red).  Allergic/Immunologic: Negative.   Neurological: Negative for dizziness and light-headedness.  Hematological: Negative for adenopathy. Bruises/bleeds easily.  Psychiatric/Behavioral: Positive for sleep disturbance (sleeping on 2 pillows). Negative for dysphoric mood. The patient is not nervous/anxious.      Physical Exam  Constitutional: He is oriented to person, place, and time. He appears well-developed and well-nourished.  HENT:  Head: Normocephalic and atraumatic.  Neck: Normal range of motion. Neck supple. No JVD  present.  Cardiovascular: Regular rhythm. Tachycardia present.  Pulmonary/Chest: Effort normal. He has no wheezes. He has no rhonchi. He has no rales.  Abdominal: Soft. He exhibits distension. There is no abdominal tenderness.  Musculoskeletal:        General: Edema (2+ pitting edema in left lower leg to knee) present. No tenderness.  Neurological: He is alert and oriented to person, place, and time.  Skin: Skin is warm and dry. There is erythema (left lower leg).  Psychiatric: He has a normal mood and affect. His behavior is normal. Thought content normal.  Nursing note and vitals reviewed.   Assessment & Plan:  1: Acute on Chronic heart failure with reduced ejection fraction- - NYHA class III - moderately fluid overloaded with pitting edema, weight gain and abdominal distention - weighing daily and he was reminded to call for an overnight weight gain of >2 pounds or a weekly weight gain of >5 pounds - weight 213.12 pounds from last visit here 2 days ago -  received 80mg  IV lasix/ 47meq PO potassium yesterday - not adding salt and has been using Mrs. Dash & trying to read food labels.  - has [place] - drinking ~ 64 ounces of fluid daily (Mtn Dew, pepsi, water) - had telemedicine visit with cardiologist Rockey Situ) 12/14/2018 - BNP 05/16/2019 was 1920.0 - says that he does not take the flu vaccine; good handwashing encouraged  2: HTN- - BP  - saw PCP Vickki Muff) 02/16/2019 - BMP done 05/16/2019 reviewed and shows sodium 132, potassium 3.9, creatinine 1.0 and GFR >60  3: Tobacco use- - currently smoking ~ 3 cigarettes daily - complete cessation discussed for 3 minutes with him  4: Cellulitis of left lower leg- - left lower leg warm - will treat with keflex 500mg  TID  Patient did not bring his medications nor a list. Each medication was verbally reviewed with the patient and he was encouraged to bring the bottles to every visit to confirm accuracy of list.

## 2019-05-18 NOTE — Telephone Encounter (Signed)
After receiving the patient's message, I called and spoke with [name] [name] directly.  He was identified using 2 unique identifiers.  I discussed with the patient based on his previous ABI results as well as his current symptoms the best course of action would be to proceed directly with angiogram versus repeating studies.  This is due to the fact that the changes he described are concerning for potential limb loss.  The patient has a previous right below-knee amputation.     Recommend:  The patient has evidence of severe atherosclerotic changes of both lower extremities associated with ulceration and tissue loss of the foot.  This represents a limb threatening ischemia and places the patient at the risk for limb loss.  Patient should undergo angiography of the lower extremities with the hope for intervention for limb salvage.  The risks and benefits as well as the alternative therapies was discussed in detail with the patient.  All questions were answered.  Patient agrees to proceed with angiography.  The patient will follow up with me in the office after the procedure.

## 2019-05-18 NOTE — Telephone Encounter (Signed)
Let's get him scheduled for a left leg angiogram for ASO w/ulceration

## 2019-05-19 ENCOUNTER — Other Ambulatory Visit: Payer: Self-pay | Admitting: Family

## 2019-05-19 ENCOUNTER — Ambulatory Visit: Payer: Medicare Other | Admitting: Family

## 2019-05-19 MED ORDER — METOPROLOL SUCCINATE ER 25 MG PO TB24: 12.5000 mg | ORAL_TABLET | Freq: Every day | ORAL | 5 refills | Status: DC

## 2019-05-19 MED ORDER — ISOSORBIDE MONONITRATE ER 30 MG PO TB24: 30.0000 mg | ORAL_TABLET | Freq: Every day | ORAL | 5 refills | Status: DC

## 2019-05-19 MED ORDER — NITROGLYCERIN 0.4 MG SL SUBL: 0.4000 mg | SUBLINGUAL_TABLET | SUBLINGUAL | 5 refills | Status: DC | PRN

## 2019-05-19 MED ORDER — EZETIMIBE 10 MG PO TABS: 10.0000 mg | ORAL_TABLET | Freq: Every day | ORAL | 5 refills | Status: DC

## 2019-05-19 MED ORDER — POTASSIUM CHLORIDE CRYS ER 10 MEQ PO TBCR: 10.0000 meq | EXTENDED_RELEASE_TABLET | Freq: Every day | ORAL | 5 refills | Status: DC

## 2019-05-20 ENCOUNTER — Other Ambulatory Visit
Admission: RE | Admit: 2019-05-20 | Discharge: 2019-05-20 | Disposition: A | Discharge status: Home / Self Care | Payer: Medicare Other | Source: Ambulatory Visit | Attending: Vascular Surgery | Admitting: Vascular Surgery

## 2019-05-20 DIAGNOSIS — Z20828 Contact with and (suspected) exposure to other viral communicable diseases: Secondary | ICD-10-CM | POA: Insufficient documentation

## 2019-05-20 DIAGNOSIS — Z01812 Encounter for preprocedural laboratory examination: Secondary | ICD-10-CM | POA: Diagnosis present

## 2019-05-21 LAB — SARS CORONAVIRUS 2 (TAT 6-24 HRS): SARS Coronavirus 2: NEGATIVE

## 2019-05-24 ENCOUNTER — Other Ambulatory Visit (INDEPENDENT_AMBULATORY_CARE_PROVIDER_SITE_OTHER): Payer: Self-pay | Admitting: Nurse Practitioner

## 2019-05-24 ENCOUNTER — Telehealth (INDEPENDENT_AMBULATORY_CARE_PROVIDER_SITE_OTHER): Payer: Self-pay

## 2019-05-24 ENCOUNTER — Other Ambulatory Visit: Payer: Self-pay | Admitting: Family

## 2019-05-24 MED ORDER — CLINDAMYCIN HCL 300 MG PO CAPS: 300.0000 mg | ORAL_CAPSULE | Freq: Three times a day (TID) | ORAL | 0 refills | Status: DC

## 2019-05-24 NOTE — Progress Notes (Signed)
Patient says that he's continuing to cough and feels like the previous antibiotic isn't working. Says that he's taken clindamycin before with good results. RX sent to [facility] to take 300mg  TID for 10 days.

## 2019-05-24 NOTE — Telephone Encounter (Signed)
Patient called this morning to reschedule his leg angio with Dr. Lucky Cowboy for today at 12:15 pm. Patient has been rescheduled to 05/31/2019 with Dr. Lucky Cowboy with a 6:45 am arrival time to the [facility]. Patient will do covid testing on 05/28/2019 between 12:30-2:30 pm at the [facility]. New date and times will be mailed to the patient.

## 2019-05-27 ENCOUNTER — Other Ambulatory Visit: Admission: RE | Admit: 2019-05-27 | Payer: Medicare Other | Source: Ambulatory Visit

## 2019-05-28 ENCOUNTER — Ambulatory Visit: Payer: Medicare Other | Attending: Family | Admitting: Family

## 2019-05-28 ENCOUNTER — Encounter: Payer: Self-pay | Admitting: Family

## 2019-05-28 ENCOUNTER — Other Ambulatory Visit: Payer: Self-pay

## 2019-05-28 VITALS — BP 120/88 | HR 102 | Resp 18 | Ht 67.0 in | Wt 208.8 lb

## 2019-05-28 DIAGNOSIS — J449 Chronic obstructive pulmonary disease, unspecified: Secondary | ICD-10-CM | POA: Diagnosis not present

## 2019-05-28 DIAGNOSIS — F17210 Nicotine dependence, cigarettes, uncomplicated: Secondary | ICD-10-CM | POA: Insufficient documentation

## 2019-05-28 DIAGNOSIS — Z794 Long term (current) use of insulin: Secondary | ICD-10-CM | POA: Diagnosis not present

## 2019-05-28 DIAGNOSIS — M1990 Unspecified osteoarthritis, unspecified site: Secondary | ICD-10-CM | POA: Diagnosis not present

## 2019-05-28 DIAGNOSIS — Z79899 Other long term (current) drug therapy: Secondary | ICD-10-CM | POA: Insufficient documentation

## 2019-05-28 DIAGNOSIS — R5383 Other fatigue: Secondary | ICD-10-CM | POA: Diagnosis present

## 2019-05-28 DIAGNOSIS — I252 Old myocardial infarction: Secondary | ICD-10-CM | POA: Diagnosis not present

## 2019-05-28 DIAGNOSIS — Z720 Tobacco use: Secondary | ICD-10-CM

## 2019-05-28 DIAGNOSIS — I10 Essential (primary) hypertension: Secondary | ICD-10-CM

## 2019-05-28 DIAGNOSIS — E785 Hyperlipidemia, unspecified: Secondary | ICD-10-CM | POA: Diagnosis not present

## 2019-05-28 DIAGNOSIS — E1140 Type 2 diabetes mellitus with diabetic neuropathy, unspecified: Secondary | ICD-10-CM | POA: Diagnosis not present

## 2019-05-28 DIAGNOSIS — Z8249 Family history of ischemic heart disease and other diseases of the circulatory system: Secondary | ICD-10-CM | POA: Diagnosis not present

## 2019-05-28 DIAGNOSIS — I739 Peripheral vascular disease, unspecified: Secondary | ICD-10-CM

## 2019-05-28 DIAGNOSIS — I110 Hypertensive heart disease with heart failure: Secondary | ICD-10-CM | POA: Diagnosis not present

## 2019-05-28 DIAGNOSIS — I255 Ischemic cardiomyopathy: Secondary | ICD-10-CM | POA: Diagnosis not present

## 2019-05-28 DIAGNOSIS — G2581 Restless legs syndrome: Secondary | ICD-10-CM | POA: Diagnosis not present

## 2019-05-28 DIAGNOSIS — E1151 Type 2 diabetes mellitus with diabetic peripheral angiopathy without gangrene: Secondary | ICD-10-CM | POA: Insufficient documentation

## 2019-05-28 DIAGNOSIS — I2510 Atherosclerotic heart disease of native coronary artery without angina pectoris: Secondary | ICD-10-CM | POA: Insufficient documentation

## 2019-05-28 DIAGNOSIS — I5022 Chronic systolic (congestive) heart failure: Secondary | ICD-10-CM

## 2019-05-28 DIAGNOSIS — Z955 Presence of coronary angioplasty implant and graft: Secondary | ICD-10-CM | POA: Insufficient documentation

## 2019-05-28 LAB — BASIC METABOLIC PANEL
Anion gap: 11 (ref 5–15)
BUN: 14 mg/dL (ref 6–20)
CO2: 29 mmol/L (ref 22–32)
Calcium: 8.5 mg/dL — ABNORMAL LOW (ref 8.9–10.3)
Chloride: 92 mmol/L — ABNORMAL LOW (ref 98–111)
Creatinine, Ser: 1.09 mg/dL (ref 0.61–1.24)
GFR calc Af Amer: >60 mL/min (ref >60)
GFR calc non Af Amer: >60 mL/min (ref >60)
Glucose, Bld: 103 mg/dL — ABNORMAL HIGH (ref 70–99)
Potassium: 3.7 mmol/L (ref 3.5–5.1)
Sodium: 132 mmol/L — ABNORMAL LOW (ref 135–145)

## 2019-05-28 LAB — BRAIN NATRIURETIC PEPTIDE: B Natriuretic Peptide: 1165 pg/mL — ABNORMAL HIGH (ref 0.0–100.0)

## 2019-05-28 NOTE — Progress Notes (Signed)
Patient ID: [name] [name], male    DOB: 08/28/1960, 58 y.o.   MRN: [id number]  HPI  [name] [name] is a 58 y/o male with a history of obstructive sleep apnea, PVD, PAD, HTN, hyperlipidemia, DM, CAD, COPD, BPH, asthma, current tobacco use and chronic heart failure.   Echo report from 11/06/2018 reviewed and showed an EF of 20-25% along with moderate TR and a PA pressure of 59.6 mmHg. Echo report from 04/25/17 reviewed and shows an EF of 25-30% along with a PA pressure of 34 mm Hg. EF has declined from 55-60% back in 2016.   Cardiac catheterization done 04/28/17 showed significant three-vessel disease with a patient stent in the RCA and LAD. Significant proximal RCS disease and the stent as well as diffuse mid and distal disease. LAD has moderate disease. Severely elevated left ventricular end-diastolic pressure at 34 mmHg. Possible CABG in the future with optimizing medical management. Stress done in 2015.  Was in the ED 03/08/2019 due to epididymitis where he was treated and released.   He presents today for a follow-up visit with a chief complaint of minimal fatigue upon moderate exertion. He describes this as chronic in nature having been present for several years although he feels much better since he was last here. He has associated cough, swelling (improving) and intermittent difficulty sleeping along with this. He denies any dizziness, abdominal distention, palpitations, chest pain, shortness of breath or weight gain.   Received 80mg  IV lasix/ 51meq PO potassium 11 days ago.  Says that he had stopped all his medications and have been reintroducing them back one by one to see which one is bothering his stomach.   Past Medical History:  Diagnosis Date  . Arthritis   . Asthma   . Atherosclerosis   . BPH (benign prostatic hyperplasia)   . Carotid arterial disease ([place])   . Charcot's joint of foot, right   . Chronic combined systolic (congestive) and diastolic (congestive) heart failure ([place])     a. 02/2013 EF 50% by LV gram; b. 04/2017 Echo: EF 25-30%. diff HK. Gr2 DD. Mod dil LA/RV. PASP 36mmHg.  [name] COPD (chronic obstructive pulmonary disease) ([place])   . Coronary artery disease    a. 2013 S/P PCI of LAD [facility]);  b. 03/2013 PCI: RCA 90p ( 2.5 x 23 mm DES); c. 02/2014 Cath: patent RCA stent->Med Rx; d. 04/2017 Cath: LM nl, LAd 30ost/p, 86m/d, D1 80ost, LCX 90p/m, OM1 85, RCA 70/20p, 40m, 70d->Referred for CT Surg-felt to be poor candidate.  . Diabetic neuropathy ([place])   . Diabetic ulcer of right foot ([place])   . Hernia   . Hyperlipidemia   . Hypertension   . Ischemic cardiomyopathy    a. 04/2017 Echo: EF 25-30%; b. 08/2017 Cardiac MRI ([facility]): EF 19%, sev glob HK. RVEF 20%, mod BAE, triv [name], mild-mod TR. Basal lateral subendocardial infarct (viable), inf/infsept, dist septal ischemia, basal to mid lat peri-infarct ischemia.  . Morbid obesity ([place])   . Neuropathy   . PAD (peripheral artery disease) ([place])    a. Followed by Dr. Lucky Cowboy; b. 08/2010 Periph Angio: RSFA 70-80p (6X60 self-expanding stent); c. 09/2010 Periph Angio: L SFA 100p (7x unknown length self-expanding stent); d. 06/2015 Periph Angio: R SFA short segment occlusion (6x12 self-expanding stent).  . Restless leg syndrome   . Sleep apnea   . Subclavian artery stenosis, right ([place])   . Syncope and collapse   . Tobacco use    a. 75+ yr hx -  still smoking 1ppd, down from 2 ppd.  . Type II diabetes mellitus ([place])   . Varicose vein    Past Surgical History:  Procedure Laterality Date  . ABDOMINAL AORTAGRAM N/A 06/23/2013   Procedure: ABDOMINAL Maxcine Ham;  Surgeon: Wellington Hampshire, MD;  Location: [place] CATH LAB;  Service: Cardiovascular;  Laterality: N/A;  . AMPUTATION Right 04/01/2018   Procedure: AMPUTATION BELOW KNEE;  Surgeon: Algernon Huxley, MD;  Location: [facility] ORS;  Service: Vascular;  Laterality: Right;  . APPENDECTOMY    . CARDIAC CATHETERIZATION  10/14   [facility]; X1 STENT PROXIMAL RCA  . CARDIAC CATHETERIZATION  02/2010   [facility]  .  CARDIAC CATHETERIZATION  02/21/2014   [facility]  . CLAVICLE SURGERY    . HERNIA REPAIR    . IRRIGATION AND DEBRIDEMENT FOOT Right 01/04/2018   Procedure: IRRIGATION AND DEBRIDEMENT FOOT;  Surgeon: Samara Deist, DPM;  Location: [facility] ORS;  Service: Podiatry;  Laterality: Right;  . LEFT HEART CATH AND CORS/GRAFTS ANGIOGRAPHY N/A 04/28/2017   Procedure: LEFT HEART CATH AND CORONARY ANGIOGRAPHY;  Surgeon: Wellington Hampshire, MD;  Location: [place] CV LAB;  Service: Cardiovascular;  Laterality: N/A;  . LOWER EXTREMITY ANGIOGRAPHY Right 03/03/2018   Procedure: LOWER EXTREMITY ANGIOGRAPHY;  Surgeon: Katha Cabal, MD;  Location: [place] CV LAB;  Service: Cardiovascular;  Laterality: Right;  . LOWER EXTREMITY ANGIOGRAPHY Left 08/24/2018   Procedure: LOWER EXTREMITY ANGIOGRAPHY;  Surgeon: Algernon Huxley, MD;  Location: [place] CV LAB;  Service: Cardiovascular;  Laterality: Left;  . PERIPHERAL ARTERIAL STENT GRAFT     x2 left/right  . PERIPHERAL VASCULAR BALLOON ANGIOPLASTY Right 01/06/2018   Procedure: PERIPHERAL VASCULAR BALLOON ANGIOPLASTY;  Surgeon: Katha Cabal, MD;  Location: [place] CV LAB;  Service: Cardiovascular;  Laterality: Right;  . PERIPHERAL VASCULAR CATHETERIZATION N/A 07/06/2015   Procedure: Abdominal Aortogram w/Lower Extremity;  Surgeon: Algernon Huxley, MD;  Location: [place] CV LAB;  Service: Cardiovascular;  Laterality: N/A;  . PERIPHERAL VASCULAR CATHETERIZATION  07/06/2015   Procedure: Lower Extremity Intervention;  Surgeon: Algernon Huxley, MD;  Location: [place] CV LAB;  Service: Cardiovascular;;   Family History  Problem Relation Age of Onset  . Heart attack Father   . Heart disease Father   . Hypertension Father   . Hyperlipidemia Father   . Hypertension Mother   . Hyperlipidemia Mother    Social History   Tobacco Use  . Smoking status: Current Every Day Smoker    Packs/day: 1.00    Years: 41.00    Pack years: 41.00    Types: Cigarettes   . Smokeless tobacco: Never Used  Substance Use Topics  . Alcohol use: No   Allergies  Allergen Reactions  . Dulaglutide Anaphylaxis, Diarrhea and Hives  . Other Itching    Skin itching associated with nitro patch  . Prednisone Rash   Prior to Admission medications   Medication Sig Start Date End Date Taking? Authorizing Provider  albuterol (VENTOLIN HFA) 108 (90 Base) MCG/ACT inhaler Inhale into the lungs every 6 (six) hours as needed for wheezing or shortness of breath.   Yes [provider]  aspirin EC 81 MG tablet Take 1 tablet (81 mg total) by mouth daily. 05/11/15  Yes Wellington Hampshire, MD  gabapentin (NEURONTIN) 800 MG tablet Take 800 mg by mouth 4 (four) times daily.  08/14/17 05/28/19 Yes [provider]  Insulin Glargine (LANTUS SOLOSTAR) 100 UNIT/ML Solostar Pen Inject 30 Units into the skin at  bedtime. 04/07/18  Yes Mayo, Pete Pelt, MD  insulin lispro (HUMALOG) 100 UNIT/ML injection Sliding Scale Insulin before meals using Novolog If BS is 0 to 70 no insulin and eat/glucose tablets/glucose get If BS 71 to 150 great! If BS 151 to 200, 2 Units Novolog If BS 201 to 250, 4 Units Novolog If BS 251 to 300, 6 Units Novolog If BS 301 to 350, 8 Units Novolog If BS 351 to 400, 10 Units Novolog If BS greater than 400, take 10 Units and Contact our office during office hour 04/14/18  Yes [provider]  metFORMIN (GLUCOPHAGE-XR) 500 MG 24 hr tablet Take 1,000 mg by mouth 2 (two) times daily.    Yes [provider]  Multiple Vitamin (MULTI-VITAMINS) TABS Take 1 tablet by mouth daily.    Yes [provider]  Oxycodone HCl 10 MG TABS Take 10 mg by mouth every 6 (six) hours as needed (pain).  01/15/18  Yes [provider]  torsemide (DEMADEX) 20 MG tablet Take 40 mg by mouth daily.    Yes [provider]  traZODone (DESYREL) 50 MG tablet Take 150 mg by mouth at bedtime as needed for sleep.  08/12/17  Yes [provider]   atorvastatin (LIPITOR) 80 MG tablet Take 80 mg by mouth daily.    [provider]  calcium carbonate (TUMS - DOSED IN MG ELEMENTAL CALCIUM) 500 MG chewable tablet Chew 4 tablets by mouth daily as needed for indigestion or heartburn.    [provider]  clopidogrel (PLAVIX) 75 MG tablet Take 75 mg by mouth daily.  08/22/17   [provider]  diphenhydrAMINE (BENADRYL) 25 MG tablet Take 25 mg by mouth daily as needed for allergies.    [provider]  ezetimibe (ZETIA) 10 MG tablet Take 1 tablet (10 mg total) by mouth daily. Patient not taking: Reported on 05/28/2019 05/19/19   Darylene Price A, FNP  ipratropium-albuterol (DUONEB) 0.5-2.5 (3) MG/3ML SOLN Inhale 3 mLs into the lungs every 6 (six) hours as needed (wheezing/sob).     [provider]  isosorbide mononitrate (IMDUR) 30 MG 24 hr tablet Take 1 tablet (30 mg total) by mouth daily. Patient not taking: Reported on 05/28/2019 05/19/19   Darylene Price A, FNP  metoprolol succinate (TOPROL-XL) 25 MG 24 hr tablet Take 0.5 tablets (12.5 mg total) by mouth daily. Patient not taking: Reported on 05/28/2019 05/19/19 07/16/20  Alisa Graff, FNP  multivitamin-lutein [facility]) CAPS capsule Take 1 capsule by mouth daily. Patient not taking: Reported on 05/28/2019 01/09/18   Demetrios Loll, MD  naproxen (NAPROSYN) 500 MG tablet Take 1 tablet (500 mg total) by mouth 2 (two) times daily with a meal. Patient not taking: Reported on 05/28/2019 03/08/19   Lavonia Drafts, MD  nitroGLYCERIN (NITRODUR - DOSED IN MG/24 HR) 0.4 mg/hr patch Place 0.4 mg onto the skin daily.    [provider]  nitroGLYCERIN (NITROSTAT) 0.4 MG SL tablet Place 1 tablet (0.4 mg total) under the tongue every 5 (five) minutes as needed for chest pain. Patient not taking: Reported on 05/28/2019 05/19/19   Darylene Price A, FNP  potassium chloride (KLOR-CON) 10 MEQ tablet Take 1 tablet (10 mEq total) by mouth daily. Patient not taking:  Reported on 05/28/2019 05/19/19   Alisa Graff, FNP  promethazine (PHENERGAN) 25 MG tablet TAKE 1 TABLET BY MOUTH EVERY 6 HOURS AS NEEDED NAUSEA 04/29/18   [provider]  sacubitril-valsartan (ENTRESTO) 24-26 MG Take 1 tablet by  mouth 2 (two) times daily. Patient not taking: Reported on 05/28/2019 03/01/19   Alisa Graff, FNP  spironolactone (ALDACTONE) 25 MG tablet Take 25 mg by mouth daily.  03/02/18   [provider]    Review of Systems  Constitutional: Positive for fatigue (minimal). Negative for appetite change.  HENT: Negative for congestion, postnasal drip and sore throat.   Eyes: Negative.   Respiratory: Positive for cough. Negative for chest tightness, shortness of breath and wheezing.   Cardiovascular: Positive for leg swelling (left leg). Negative for chest pain and palpitations.  Gastrointestinal: Negative for abdominal distention and abdominal pain.  Endocrine: Negative.   Genitourinary: Negative.   Musculoskeletal: Positive for back pain. Negative for neck pain.       Has right lower leg prosthesis  Skin: Negative.   Allergic/Immunologic: Negative.   Neurological: Negative for dizziness and light-headedness.  Hematological: Negative for adenopathy. Bruises/bleeds easily.  Psychiatric/Behavioral: Positive for sleep disturbance (sleeping on 2 pillows). Negative for dysphoric mood. The patient is not nervous/anxious.    Vitals:   05/28/19 1302  BP: 120/88  Pulse: (!) 102  Resp: 18  SpO2: 97%  Weight: 208 lb 12.8 oz (94.7 kg)  Height: 5\' 7"  (1.702 m)   Wt Readings from Last 3 Encounters:  05/28/19 208 lb 12.8 oz (94.7 kg)  05/17/19 216 lb 12.8 oz (98.3 kg)  03/23/19 203 lb (92.1 kg)   Lab Results  Component Value Date   CREATININE 1.00 05/17/2019   CREATININE 0.97 02/26/2019   CREATININE 1.25 (H) 02/23/2019     Physical Exam  Constitutional: He is oriented to person, place, and time. He appears well-developed and well-nourished.  HENT:   Head: Normocephalic and atraumatic.  Neck: No JVD present.  Cardiovascular: Regular rhythm. Tachycardia present.  Pulmonary/Chest: Effort normal. He has wheezes (throughout all lung fields). He has no rhonchi. He has no rales.  Abdominal: Soft. He exhibits no distension. There is no abdominal tenderness.  Musculoskeletal:        General: Edema (+ pitting edema in left lower leg to knee) present. No tenderness.     Cervical back: Normal range of motion and neck supple.  Neurological: He is alert and oriented to person, place, and time.  Skin: Skin is warm and dry.  Psychiatric: He has a normal mood and affect. His behavior is normal. Thought content normal.  Nursing note and vitals reviewed.   Assessment & Plan:  1: Chronic heart failure with reduced ejection fraction- - NYHA class II - euvolemic - weighing daily and he was reminded to call for an overnight weight gain of >2 pounds or a weekly weight gain of >5 pounds - weight down 6 pounds from last visit here 11 days ago - received 80mg  IV lasix/ 34meq PO potassium 11 days ago - not adding salt and has been using Mrs. Dash & trying to read food labels.  - has [place] - drinking ~ 64 ounces of fluid daily (Mtn Dew, pepsi, water) - had telemedicine visit with cardiologist Rockey Situ) 12/14/2018 - BNP 05/17/2019 was 1920.0; will recheck this today - says that he does not take the flu vaccine; good handwashing encouraged  2: HTN- - BP looks good today - saw PCP Vickki Muff) 02/16/2019 & has new PCP appt on 06/08/2019 with Dr. 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 He told the NT that he was taking everything in the bag and if it wasn't in the bag, he wasn't taking it. When I asked about them, he says that he's stopped them all and has been reintroducing them back one by one and stated the handful that he's currently taking. Explained the importance of not stopping things on his own as well as the need to resume them but he's not interested as he's doing it one at a time.   Return in 2 months or sooner for any questions/problems before then.

## 2019-05-28 NOTE — Patient Instructions (Signed)
Continue weighing daily and call for an overnight weight gain of > 2 pounds or a weekly weight gain of >5 pounds. 

## 2019-05-31 ENCOUNTER — Encounter: Admission: RE | Payer: Self-pay | Source: Home / Self Care

## 2019-05-31 ENCOUNTER — Ambulatory Visit: Admission: RE | Admit: 2019-05-31 | Payer: Medicare Other | Source: Home / Self Care | Admitting: Vascular Surgery

## 2019-05-31 ENCOUNTER — Telehealth (INDEPENDENT_AMBULATORY_CARE_PROVIDER_SITE_OTHER): Payer: Self-pay

## 2019-05-31 DIAGNOSIS — L97909 Non-pressure chronic ulcer of unspecified part of unspecified lower leg with unspecified severity: Secondary | ICD-10-CM

## 2019-05-31 SURGERY — LOWER EXTREMITY ANGIOGRAPHY
Anesthesia: Moderate Sedation | Site: Leg Lower | Laterality: Left

## 2019-05-31 NOTE — Telephone Encounter (Signed)
I have attempted to contact the patient to rescheduled him for his left leg angio with Dr. Lucky Cowboy. I left a message on his home voicemail for a return call and attempted to contact him through his mobile which does not have a voice mail set up so I was unable to leave a message.

## 2019-06-01 ENCOUNTER — Telehealth (INDEPENDENT_AMBULATORY_CARE_PROVIDER_SITE_OTHER): Payer: Self-pay | Admitting: Vascular Surgery

## 2019-06-01 NOTE — Telephone Encounter (Signed)
Attempted to call patient directly earlier but went to VM and patient has no VM set up

## 2019-06-02 ENCOUNTER — Telehealth (INDEPENDENT_AMBULATORY_CARE_PROVIDER_SITE_OTHER): Payer: Self-pay | Admitting: Vascular Surgery

## 2019-06-02 ENCOUNTER — Other Ambulatory Visit: Payer: Self-pay

## 2019-06-02 ENCOUNTER — Ambulatory Visit
Admission: RE | Admit: 2019-06-02 | Discharge: 2019-06-02 | Disposition: A | Discharge status: Home / Self Care | Payer: Medicare Other | Source: Ambulatory Visit | Attending: Family | Admitting: Family

## 2019-06-02 ENCOUNTER — Telehealth: Payer: Self-pay | Admitting: Family

## 2019-06-02 ENCOUNTER — Other Ambulatory Visit: Payer: Self-pay | Admitting: Family

## 2019-06-02 DIAGNOSIS — I5022 Chronic systolic (congestive) heart failure: Secondary | ICD-10-CM | POA: Insufficient documentation

## 2019-06-02 DIAGNOSIS — I5023 Acute on chronic systolic (congestive) heart failure: Secondary | ICD-10-CM

## 2019-06-02 LAB — BASIC METABOLIC PANEL WITH GFR
Anion gap: 12 (ref 5–15)
BUN: 12 mg/dL (ref 6–20)
CO2: 29 mmol/L (ref 22–32)
Calcium: 8.7 mg/dL — ABNORMAL LOW (ref 8.9–10.3)
Chloride: 92 mmol/L — ABNORMAL LOW (ref 98–111)
Creatinine, Ser: 1.04 mg/dL (ref 0.61–1.24)
GFR calc Af Amer: >60 mL/min
GFR calc non Af Amer: >60 mL/min
Glucose, Bld: 133 mg/dL — ABNORMAL HIGH (ref 70–99)
Potassium: 3.4 mmol/L — ABNORMAL LOW (ref 3.5–5.1)
Sodium: 133 mmol/L — ABNORMAL LOW (ref 135–145)

## 2019-06-02 LAB — BRAIN NATRIURETIC PEPTIDE: B Natriuretic Peptide: 1252 pg/mL — ABNORMAL HIGH (ref 0.0–100.0)

## 2019-06-02 MED ORDER — POTASSIUM CHLORIDE CRYS ER 20 MEQ PO TBCR
EXTENDED_RELEASE_TABLET | ORAL | Status: AC
  Administered 2019-06-02: 40 meq via ORAL
  Filled 2019-06-02: qty 2

## 2019-06-02 MED ORDER — FUROSEMIDE 10 MG/ML IJ SOLN: 80.0000 mg | Freq: Once | INTRAMUSCULAR | Status: AC

## 2019-06-02 MED ORDER — FUROSEMIDE 10 MG/ML IJ SOLN
INTRAMUSCULAR | Status: AC
  Administered 2019-06-02: 80 mg via INTRAVENOUS
  Filled 2019-06-02: qty 8

## 2019-06-02 MED ORDER — SODIUM CHLORIDE FLUSH 0.9 % IV SOLN
INTRAVENOUS | Status: AC
  Filled 2019-06-02: qty 10

## 2019-06-02 MED ORDER — POTASSIUM CHLORIDE CRYS ER 20 MEQ PO TBCR: 40.0000 meq | EXTENDED_RELEASE_TABLET | Freq: Once | ORAL | Status: AC

## 2019-06-02 NOTE — Telephone Encounter (Signed)
Patient called saying that he was having swelling in his left foot and worsening shortness of breath. Will bring patient in for 80mg  IV lasix/ 41meq PO potassium today with a follow-up in the office next week. If symptoms worsen over the Christmas holiday, he's to present to the ED.

## 2019-06-05 NOTE — Progress Notes (Signed)
Patient ID: [name] [name], male    DOB: 04/02/1961, 58 y.o.   MRN: [id number]  HPI  Mr Vanderpoel is a 58 y/o male with a history of obstructive sleep apnea, PVD, PAD, HTN, hyperlipidemia, DM, CAD, COPD, BPH, asthma, current tobacco use and chronic heart failure.   Echo report from 11/06/2018 reviewed and showed an EF of 20-25% along with moderate TR and a PA pressure of 59.6 mmHg. Echo report from 04/25/17 reviewed and shows an EF of 25-30% along with a PA pressure of 34 mm Hg. EF has declined from 55-60% back in 2016.   Cardiac catheterization done 04/28/17 showed significant three-vessel disease with a patient stent in the RCA and LAD. Significant proximal RCS disease and the stent as well as diffuse mid and distal disease. LAD has moderate disease. Severely elevated left ventricular end-diastolic pressure at 34 mmHg. Possible CABG in the future with optimizing medical management. Stress done in 2015.  Was in the ED 03/08/2019 due to epididymitis where he was treated and released.   He presents today for a follow-up visit with a chief complaint of shortness of breath upon minimal exertion. He describes this as chronic in nature having been present for several years with varying levels of severity. He has associated fatigue, cough, pedal edema, rhinorrhea, headaches, light-headedness, difficulty sleeping and open wound on his left lower leg and foot. He denies any abdominal distention, palpitations, chest pain or weight gain.   Received 80mg  IV lasix/ 106meq PO potassium last week due to worsening symptoms and reports feeling better today. He still has not been taking all of his medications and didn't bring them with him today.   He has noticed some irritation from his NTG patch. He does rotate where he applies the patch.     Past Medical History:  Diagnosis Date  . Arthritis   . Asthma   . Atherosclerosis   . BPH (benign prostatic hyperplasia)   . Carotid arterial disease ([place])   . Charcot's  joint of foot, right   . Chronic combined systolic (congestive) and diastolic (congestive) heart failure ([place])    a. 02/2013 EF 50% by LV gram; b. 04/2017 Echo: EF 25-30%. diff HK. Gr2 DD. Mod dil LA/RV. PASP 61mmHg.  [name] COPD (chronic obstructive pulmonary disease) ([place])   . Coronary artery disease    a. 2013 S/P PCI of LAD [facility]);  b. 03/2013 PCI: RCA 90p ( 2.5 x 23 mm DES); c. 02/2014 Cath: patent RCA stent->Med Rx; d. 04/2017 Cath: LM nl, LAd 30ost/p, 33m/d, D1 80ost, LCX 90p/m, OM1 85, RCA 70/20p, 53m, 70d->Referred for CT Surg-felt to be poor candidate.  . Diabetic neuropathy ([place])   . Diabetic ulcer of right foot ([place])   . Hernia   . Hyperlipidemia   . Hypertension   . Ischemic cardiomyopathy    a. 04/2017 Echo: EF 25-30%; b. 08/2017 Cardiac MRI ([facility]): EF 19%, sev glob HK. RVEF 20%, mod BAE, triv MR, mild-mod TR. Basal lateral subendocardial infarct (viable), inf/infsept, dist septal ischemia, basal to mid lat peri-infarct ischemia.  . Morbid obesity ([place])   . Neuropathy   . PAD (peripheral artery disease) ([place])    a. Followed by Dr. Lucky Cowboy; b. 08/2010 Periph Angio: RSFA 70-80p (6X60 self-expanding stent); c. 09/2010 Periph Angio: L SFA 100p (7x unknown length self-expanding stent); d. 06/2015 Periph Angio: R SFA short segment occlusion (6x12 self-expanding stent).  . Restless leg syndrome   . Sleep apnea   . Subclavian artery stenosis,  right ([place])   . Syncope and collapse   . Tobacco use    a. 75+ yr hx - still smoking 1ppd, down from 2 ppd.  . Type II diabetes mellitus ([place])   . Varicose vein    Past Surgical History:  Procedure Laterality Date  . ABDOMINAL AORTAGRAM N/A 06/23/2013   Procedure: ABDOMINAL Maxcine Ham;  Surgeon: Wellington Hampshire, MD;  Location: [facility] CATH LAB;  Service: Cardiovascular;  Laterality: N/A;  . AMPUTATION Right 04/01/2018   Procedure: AMPUTATION BELOW KNEE;  Surgeon: Algernon Huxley, MD;  Location: [facility] ORS;  Service: Vascular;  Laterality: Right;  . APPENDECTOMY    .  CARDIAC CATHETERIZATION  10/14   [facility]; X1 STENT PROXIMAL RCA  . CARDIAC CATHETERIZATION  02/2010   [facility]  . CARDIAC CATHETERIZATION  02/21/2014   [facility]  . CLAVICLE SURGERY    . HERNIA REPAIR    . IRRIGATION AND DEBRIDEMENT FOOT Right 01/04/2018   Procedure: IRRIGATION AND DEBRIDEMENT FOOT;  Surgeon: Samara Deist, DPM;  Location: [facility] ORS;  Service: Podiatry;  Laterality: Right;  . LEFT HEART CATH AND CORS/GRAFTS ANGIOGRAPHY N/A 04/28/2017   Procedure: LEFT HEART CATH AND CORONARY ANGIOGRAPHY;  Surgeon: Wellington Hampshire, MD;  Location: [place] CV LAB;  Service: Cardiovascular;  Laterality: N/A;  . LOWER EXTREMITY ANGIOGRAPHY Right 03/03/2018   Procedure: LOWER EXTREMITY ANGIOGRAPHY;  Surgeon: Katha Cabal, MD;  Location: [place] CV LAB;  Service: Cardiovascular;  Laterality: Right;  . LOWER EXTREMITY ANGIOGRAPHY Left 08/24/2018   Procedure: LOWER EXTREMITY ANGIOGRAPHY;  Surgeon: Algernon Huxley, MD;  Location: [place] CV LAB;  Service: Cardiovascular;  Laterality: Left;  . PERIPHERAL ARTERIAL STENT GRAFT     x2 left/right  . PERIPHERAL VASCULAR BALLOON ANGIOPLASTY Right 01/06/2018   Procedure: PERIPHERAL VASCULAR BALLOON ANGIOPLASTY;  Surgeon: Katha Cabal, MD;  Location: [place] CV LAB;  Service: Cardiovascular;  Laterality: Right;  . PERIPHERAL VASCULAR CATHETERIZATION N/A 07/06/2015   Procedure: Abdominal Aortogram w/Lower Extremity;  Surgeon: Algernon Huxley, MD;  Location: [place] CV LAB;  Service: Cardiovascular;  Laterality: N/A;  . PERIPHERAL VASCULAR CATHETERIZATION  07/06/2015   Procedure: Lower Extremity Intervention;  Surgeon: Algernon Huxley, MD;  Location: [place] CV LAB;  Service: Cardiovascular;;   Family History  Problem Relation Age of Onset  . Heart attack Father   . Heart disease Father   . Hypertension Father   . Hyperlipidemia Father   . Hypertension Mother   . Hyperlipidemia Mother    Social History   Tobacco Use  . Smoking  status: Current Every Day Smoker    Packs/day: 1.00    Years: 41.00    Pack years: 41.00    Types: Cigarettes  . Smokeless tobacco: Never Used  Substance Use Topics  . Alcohol use: No   Allergies  Allergen Reactions  . Dulaglutide Anaphylaxis, Diarrhea and Hives  . Other Itching    Skin itching associated with nitro patch  . Prednisone Rash   Prior to Admission medications   Medication Sig Start Date End Date Taking? Authorizing Provider  albuterol (VENTOLIN HFA) 108 (90 Base) MCG/ACT inhaler Inhale into the lungs every 6 (six) hours as needed for wheezing or shortness of breath.   Yes [provider]  aspirin EC 81 MG tablet Take 1 tablet (81 mg total) by mouth daily. 05/11/15  Yes Wellington Hampshire, MD  atorvastatin (LIPITOR) 80 MG tablet Take 80 mg by mouth daily.   Yes [provider]  calcium carbonate (TUMS - DOSED IN MG ELEMENTAL CALCIUM) 500 MG chewable tablet Chew 4 tablets by mouth daily as needed for indigestion or heartburn.   Yes [provider]  clopidogrel (PLAVIX) 75 MG tablet Take 75 mg by mouth daily.  08/22/17  Yes [provider]  diphenhydrAMINE (BENADRYL) 25 MG tablet Take 25 mg by mouth daily as needed for allergies.   Yes [provider]  gabapentin (NEURONTIN) 800 MG tablet Take 800 mg by mouth 4 (four) times daily.  08/14/17 06/07/19 Yes [provider]  Insulin Glargine (LANTUS SOLOSTAR) 100 UNIT/ML Solostar Pen Inject 30 Units into the skin at bedtime. 04/07/18  Yes Mayo, Pete Pelt, MD  insulin lispro (HUMALOG) 100 UNIT/ML injection Sliding Scale Insulin before meals using Novolog If BS is 0 to 70 no insulin and eat/glucose tablets/glucose get If BS 71 to 150 great! If BS 151 to 200, 2 Units Novolog If BS 201 to 250, 4 Units Novolog If BS 251 to 300, 6 Units Novolog If BS 301 to 350, 8 Units Novolog If BS 351 to 400, 10 Units Novolog If BS greater than 400, take 10 Units and Contact our office during office hour  04/14/18  Yes [provider]  ipratropium-albuterol (DUONEB) 0.5-2.5 (3) MG/3ML SOLN Inhale 3 mLs into the lungs every 6 (six) hours as needed (wheezing/sob).    Yes [provider]  metFORMIN (GLUCOPHAGE-XR) 500 MG 24 hr tablet Take 1,000 mg by mouth 2 (two) times daily.    Yes [provider]  Multiple Vitamin (MULTI-VITAMINS) TABS Take 1 tablet by mouth daily.    Yes [provider]  nitroGLYCERIN (NITRODUR - DOSED IN MG/24 HR) 0.4 mg/hr patch Place 0.4 mg onto the skin daily.   Yes [provider]  Oxycodone HCl 10 MG TABS Take 10 mg by mouth every 6 (six) hours as needed (pain).  01/15/18  Yes [provider]  potassium chloride (KLOR-CON) 10 MEQ tablet Take 1 tablet (10 mEq total) by mouth daily. Patient taking differently: Take 20 mEq by mouth daily. 15meq AM/ 41meq PM 05/19/19  Yes Jersie Beel A, FNP  promethazine (PHENERGAN) 25 MG tablet TAKE 1 TABLET BY MOUTH EVERY 6 HOURS AS NEEDED NAUSEA 04/29/18  Yes [provider]  torsemide (DEMADEX) 20 MG tablet Take 40 mg by mouth daily. 40mg  AM/ 20mg  PM   Yes [provider]  traZODone (DESYREL) 50 MG tablet Take 150 mg by mouth at bedtime as needed for sleep.  08/12/17  Yes [provider]  ezetimibe (ZETIA) 10 MG tablet Take 1 tablet (10 mg total) by mouth daily. Patient not taking: Reported on 05/28/2019 05/19/19   Darylene Price A, FNP  isosorbide mononitrate (IMDUR) 30 MG 24 hr tablet Take 1 tablet (30 mg total) by mouth daily. Patient not taking: Reported on 05/28/2019 05/19/19   Darylene Price A, FNP  metoprolol succinate (TOPROL-XL) 25 MG 24 hr tablet Take 0.5 tablets (12.5 mg total) by mouth daily.  05/19/19 07/16/20  Alisa Graff, FNP  multivitamin-lutein (OCUVITE-LUTEIN) CAPS capsule Take 1 capsule by mouth daily. Patient not taking: Reported on 05/28/2019 01/09/18   Demetrios Loll, MD  naproxen (NAPROSYN) 500 MG tablet Take 1 tablet (500 mg total) by mouth 2 (two)  times daily with a meal. Patient not taking: Reported on 05/28/2019 03/08/19   Lavonia Drafts, MD  nitroGLYCERIN (NITROSTAT) 0.4 MG SL tablet Place 1 tablet (0.4 mg total) under the tongue every 5 (five) minutes as  needed for chest pain. Patient not taking: Reported on 05/28/2019 05/19/19   Alisa Graff, FNP  sacubitril-valsartan (ENTRESTO) 24-26 MG Take 1 tablet by mouth 2 (two) times daily. Patient not taking: Reported on 05/28/2019 03/01/19   Alisa Graff, FNP  spironolactone (ALDACTONE) 25 MG tablet Take 25 mg by mouth daily.  03/02/18   [provider]     Review of Systems  Constitutional: Positive for fatigue (minimal). Negative for appetite change.  HENT: Positive for rhinorrhea. Negative for congestion, postnasal drip and sore throat.   Eyes: Negative.   Respiratory: Positive for cough and shortness of breath (upon exertion). Negative for chest tightness and wheezing.   Cardiovascular: Positive for leg swelling (left leg). Negative for chest pain and palpitations.  Gastrointestinal: Negative for abdominal distention and abdominal pain.  Endocrine: Negative.   Genitourinary: Negative.   Musculoskeletal: Positive for back pain. Negative for neck pain.       Has right lower leg prosthesis  Skin: Positive for wound (left foot).  Allergic/Immunologic: Negative.   Neurological: Positive for light-headedness and headaches. Negative for dizziness.  Hematological: Negative for adenopathy. Bruises/bleeds easily.  Psychiatric/Behavioral: Positive for sleep disturbance (sleeping on 2 pillows). Negative for dysphoric mood. The patient is not nervous/anxious.    Vitals:   06/07/19 1009  BP: 118/78  Pulse: (!) 105  Resp: 18  SpO2: 94%  Weight: 209 lb 9.6 oz (95.1 kg)  Height: 5\' 7"  (1.702 m)   Wt Readings from Last 3 Encounters:  06/07/19 209 lb 9.6 oz (95.1 kg)  05/28/19 208 lb 12.8 oz (94.7 kg)  05/17/19 216 lb 12.8 oz (98.3 kg)   Lab Results  Component Value Date    CREATININE 1.04 06/02/2019   CREATININE 1.09 05/28/2019   CREATININE 1.00 05/17/2019    Physical Exam  Constitutional: He is oriented to person, place, and time. He appears well-developed and well-nourished.  HENT:  Head: Normocephalic and atraumatic.  Neck: No JVD present.  Cardiovascular: Regular rhythm. Tachycardia present.  Pulmonary/Chest: Effort normal. He has no wheezes. He has no rhonchi. He has no rales.  Abdominal: Soft. He exhibits no distension. There is no abdominal tenderness.  Musculoskeletal:        General: Edema (+ pitting edema in left lower leg ) present. No tenderness.     Cervical back: Normal range of motion and neck supple.  Neurological: He is alert and oriented to person, place, and time.  Skin: Skin is warm and dry.  Psychiatric: He has a normal mood and affect. His behavior is normal. Thought content normal.  Nursing note and vitals reviewed.   Assessment & Plan:  1: Chronic heart failure with reduced ejection fraction- - NYHA class III - euvolemic today - weighing daily and he was reminded to call for an overnight weight gain of >2 pounds or a weekly weight gain of >5 pounds - weight up 1 pound from last visit here 1 week ago - received 80mg  IV lasix/ 28meq PO potassium lat week & says that he feels better from last week - will get BMP today - will resume entresto 24/26mg  BID today - not adding salt and has been using Mrs. Dash & trying to read food labels.  - drinking ~ 64 ounces of fluid daily (Mtn Dew, pepsi, water) - had telemedicine visit with cardiologist Rockey Situ) 12/14/2018; f/u appt scheduled with [facility] on 06/21/19 - BNP 06/02/2019 was 1252.0 - says that he does not take the flu vaccine; good handwashing encouraged  2: HTN- - BP looks good today - saw PCP Vickki Muff) 02/16/2019 & has new PCP appt on 06/08/2019 with Dr. 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 He's not sure of exactly what he is taking and tells the NT he's not taking something but then tells me he is taking it. Is sure that he isn't taking entresto as he remembers being "told to stop it".   Emphasized that he needs to bring all his bottles to every visit every time and explained the importance of his entresto, metoprolol etc on his heart.   Return in 6 weeks or sooner for any questions/problems before then.

## 2019-06-07 ENCOUNTER — Ambulatory Visit: Payer: Medicare Other | Attending: Family | Admitting: Family

## 2019-06-07 ENCOUNTER — Other Ambulatory Visit: Payer: Self-pay

## 2019-06-07 ENCOUNTER — Encounter: Payer: Self-pay | Admitting: Family

## 2019-06-07 VITALS — BP 118/78 | HR 105 | Resp 18 | Ht 67.0 in | Wt 209.6 lb

## 2019-06-07 DIAGNOSIS — E1151 Type 2 diabetes mellitus with diabetic peripheral angiopathy without gangrene: Secondary | ICD-10-CM | POA: Insufficient documentation

## 2019-06-07 DIAGNOSIS — M1990 Unspecified osteoarthritis, unspecified site: Secondary | ICD-10-CM | POA: Diagnosis not present

## 2019-06-07 DIAGNOSIS — I10 Essential (primary) hypertension: Secondary | ICD-10-CM

## 2019-06-07 DIAGNOSIS — Z720 Tobacco use: Secondary | ICD-10-CM

## 2019-06-07 DIAGNOSIS — Z79899 Other long term (current) drug therapy: Secondary | ICD-10-CM | POA: Insufficient documentation

## 2019-06-07 DIAGNOSIS — E1140 Type 2 diabetes mellitus with diabetic neuropathy, unspecified: Secondary | ICD-10-CM | POA: Insufficient documentation

## 2019-06-07 DIAGNOSIS — Z888 Allergy status to other drugs, medicaments and biological substances status: Secondary | ICD-10-CM | POA: Insufficient documentation

## 2019-06-07 DIAGNOSIS — Z7902 Long term (current) use of antithrombotics/antiplatelets: Secondary | ICD-10-CM | POA: Insufficient documentation

## 2019-06-07 DIAGNOSIS — Z89511 Acquired absence of right leg below knee: Secondary | ICD-10-CM | POA: Insufficient documentation

## 2019-06-07 DIAGNOSIS — I70299 Other atherosclerosis of native arteries of extremities, unspecified extremity: Secondary | ICD-10-CM

## 2019-06-07 DIAGNOSIS — N400 Enlarged prostate without lower urinary tract symptoms: Secondary | ICD-10-CM | POA: Insufficient documentation

## 2019-06-07 DIAGNOSIS — I70209 Unspecified atherosclerosis of native arteries of extremities, unspecified extremity: Secondary | ICD-10-CM | POA: Diagnosis not present

## 2019-06-07 DIAGNOSIS — Z7982 Long term (current) use of aspirin: Secondary | ICD-10-CM | POA: Diagnosis not present

## 2019-06-07 DIAGNOSIS — I5022 Chronic systolic (congestive) heart failure: Secondary | ICD-10-CM | POA: Diagnosis present

## 2019-06-07 DIAGNOSIS — L97909 Non-pressure chronic ulcer of unspecified part of unspecified lower leg with unspecified severity: Secondary | ICD-10-CM

## 2019-06-07 DIAGNOSIS — J449 Chronic obstructive pulmonary disease, unspecified: Secondary | ICD-10-CM | POA: Insufficient documentation

## 2019-06-07 DIAGNOSIS — F17210 Nicotine dependence, cigarettes, uncomplicated: Secondary | ICD-10-CM | POA: Insufficient documentation

## 2019-06-07 DIAGNOSIS — G4733 Obstructive sleep apnea (adult) (pediatric): Secondary | ICD-10-CM | POA: Insufficient documentation

## 2019-06-07 DIAGNOSIS — Z955 Presence of coronary angioplasty implant and graft: Secondary | ICD-10-CM | POA: Diagnosis not present

## 2019-06-07 DIAGNOSIS — Z8249 Family history of ischemic heart disease and other diseases of the circulatory system: Secondary | ICD-10-CM | POA: Insufficient documentation

## 2019-06-07 DIAGNOSIS — G2581 Restless legs syndrome: Secondary | ICD-10-CM | POA: Diagnosis not present

## 2019-06-07 DIAGNOSIS — Z794 Long term (current) use of insulin: Secondary | ICD-10-CM | POA: Diagnosis not present

## 2019-06-07 DIAGNOSIS — I2510 Atherosclerotic heart disease of native coronary artery without angina pectoris: Secondary | ICD-10-CM | POA: Insufficient documentation

## 2019-06-07 DIAGNOSIS — I110 Hypertensive heart disease with heart failure: Secondary | ICD-10-CM | POA: Diagnosis not present

## 2019-06-07 DIAGNOSIS — E785 Hyperlipidemia, unspecified: Secondary | ICD-10-CM | POA: Diagnosis not present

## 2019-06-07 LAB — BASIC METABOLIC PANEL
Anion gap: 11 (ref 5–15)
BUN: 17 mg/dL (ref 6–20)
CO2: 28 mmol/L (ref 22–32)
Calcium: 8.9 mg/dL (ref 8.9–10.3)
Chloride: 92 mmol/L — ABNORMAL LOW (ref 98–111)
Creatinine, Ser: 1.12 mg/dL (ref 0.61–1.24)
GFR calc Af Amer: >60 mL/min (ref >60)
GFR calc non Af Amer: >60 mL/min (ref >60)
Glucose, Bld: 157 mg/dL — ABNORMAL HIGH (ref 70–99)
Potassium: 4.1 mmol/L (ref 3.5–5.1)
Sodium: 131 mmol/L — ABNORMAL LOW (ref 135–145)

## 2019-06-07 NOTE — Patient Instructions (Addendum)
Continue weighing daily and call for an overnight weight gain of > 2 pounds or a weekly weight gain of >5 pounds.  Resume entresto 24/26mg  twice daily

## 2019-06-08 ENCOUNTER — Ambulatory Visit (INDEPENDENT_AMBULATORY_CARE_PROVIDER_SITE_OTHER): Payer: Medicare Other | Admitting: Vascular Surgery

## 2019-06-08 ENCOUNTER — Encounter (INDEPENDENT_AMBULATORY_CARE_PROVIDER_SITE_OTHER): Payer: Self-pay

## 2019-06-08 ENCOUNTER — Telehealth (INDEPENDENT_AMBULATORY_CARE_PROVIDER_SITE_OTHER): Payer: Self-pay

## 2019-06-08 ENCOUNTER — Encounter (INDEPENDENT_AMBULATORY_CARE_PROVIDER_SITE_OTHER): Payer: Self-pay | Admitting: Vascular Surgery

## 2019-06-08 VITALS — BP 128/78 | HR 115 | Resp 17 | Wt 207.8 lb

## 2019-06-08 DIAGNOSIS — S88111A Complete traumatic amputation at level between knee and ankle, right lower leg, initial encounter: Secondary | ICD-10-CM

## 2019-06-08 DIAGNOSIS — I10 Essential (primary) hypertension: Secondary | ICD-10-CM | POA: Diagnosis not present

## 2019-06-08 DIAGNOSIS — E118 Type 2 diabetes mellitus with unspecified complications: Secondary | ICD-10-CM

## 2019-06-08 DIAGNOSIS — E782 Mixed hyperlipidemia: Secondary | ICD-10-CM | POA: Diagnosis not present

## 2019-06-08 DIAGNOSIS — Z794 Long term (current) use of insulin: Secondary | ICD-10-CM

## 2019-06-08 DIAGNOSIS — I7025 Atherosclerosis of native arteries of other extremities with ulceration: Secondary | ICD-10-CM

## 2019-06-08 NOTE — Assessment & Plan Note (Signed)
blood pressure control important in reducing the progression of atherosclerotic disease. On appropriate oral medications.  

## 2019-06-08 NOTE — Assessment & Plan Note (Signed)
lipid control important in reducing the progression of atherosclerotic disease. Continue statin therapy  

## 2019-06-08 NOTE — Assessment & Plan Note (Signed)
Healed, prosthesis in place

## 2019-06-08 NOTE — Progress Notes (Signed)
MRN : [id number]  [name] [name] is a 58 y.o. (July 01, 1960) male who presents with chief complaint of  Chief Complaint  Patient presents with  . Follow-up    left leg wounds  .  History of Present Illness: Patient returns today in follow up of his PAD.  He has developed a left heel ulceration.  He has actually been on the schedule for an angiogram twice but has canceled and the last time did not get his Covid test.  He has known history of peripheral arterial disease.  He is already lost the right leg.  He had monophasic flow in the tibial vessels a couple of months ago but did not have ulceration at that time.  No fevers or chills.  He has had some redness and drainage from the heel ulceration.  He has had some leg swelling and a small lateral calf ulcerations present as well.  This area is painful.  It is mildly tender to the touch.  Current Outpatient Medications  Medication Sig Dispense Refill  . albuterol (VENTOLIN HFA) 108 (90 Base) MCG/ACT inhaler Inhale into the lungs every 6 (six) hours as needed for wheezing or shortness of breath.    [name] aspirin EC 81 MG tablet Take 1 tablet (81 mg total) by mouth daily. 90 tablet 3  . atorvastatin (LIPITOR) 80 MG tablet Take 80 mg by mouth daily.    . calcium carbonate (TUMS - DOSED IN MG ELEMENTAL CALCIUM) 500 MG chewable tablet Chew 4 tablets by mouth daily as needed for indigestion or heartburn.    . clopidogrel (PLAVIX) 75 MG tablet Take 75 mg by mouth daily.     . diphenhydrAMINE (BENADRYL) 25 MG tablet Take 25 mg by mouth daily as needed for allergies.    . Insulin Glargine (LANTUS SOLOSTAR) 100 UNIT/ML Solostar Pen Inject 30 Units into the skin at bedtime. 15 mL 11  . insulin lispro (HUMALOG) 100 UNIT/ML injection Sliding Scale Insulin before meals using Novolog If BS is 0 to 70 no insulin and eat/glucose tablets/glucose get If BS 71 to 150 great! If BS 151 to 200, 2 Units Novolog If BS 201 to 250, 4 Units Novolog If BS 251 to 300, 6 Units  Novolog If BS 301 to 350, 8 Units Novolog If BS 351 to 400, 10 Units Novolog If BS greater than 400, take 10 Units and Contact our office during office hour    . ipratropium-albuterol (DUONEB) 0.5-2.5 (3) MG/3ML SOLN Inhale 3 mLs into the lungs every 6 (six) hours as needed (wheezing/sob).     . metFORMIN (GLUCOPHAGE-XR) 500 MG 24 hr tablet Take 1,000 mg by mouth 2 (two) times daily.     . metoprolol succinate (TOPROL-XL) 25 MG 24 hr tablet Take 0.5 tablets (12.5 mg total) by mouth daily. 15 tablet 5  . Multiple Vitamin (MULTI-VITAMINS) TABS Take 1 tablet by mouth daily.     . nitroGLYCERIN (NITRODUR - DOSED IN MG/24 HR) 0.4 mg/hr patch Place 0.4 mg onto the skin daily.    . Oxycodone HCl 10 MG TABS Take 10 mg by mouth every 6 (six) hours as needed (pain).     . potassium chloride (KLOR-CON) 10 MEQ tablet Take 1 tablet (10 mEq total) by mouth daily. (Patient taking differently: Take 20 mEq by mouth daily. 110meq AM/ 88meq PM) 30 tablet 5  . promethazine (PHENERGAN) 25 MG tablet TAKE 1 TABLET BY MOUTH EVERY 6 HOURS AS NEEDED NAUSEA    . spironolactone (ALDACTONE)  25 MG tablet Take 25 mg by mouth daily.     [name] torsemide (DEMADEX) 20 MG tablet Take 40 mg by mouth daily. 40mg  AM/ 20mg  PM    . traZODone (DESYREL) 50 MG tablet Take 150 mg by mouth at bedtime as needed for sleep.     [name] ezetimibe (ZETIA) 10 MG tablet Take 1 tablet (10 mg total) by mouth daily. (Patient not taking: Reported on 05/28/2019) 30 tablet 5  . gabapentin (NEURONTIN) 800 MG tablet Take 800 mg by mouth 4 (four) times daily.     . isosorbide mononitrate (IMDUR) 30 MG 24 hr tablet Take 1 tablet (30 mg total) by mouth daily. (Patient not taking: Reported on 05/28/2019) 30 tablet 5  . multivitamin-lutein (OCUVITE-LUTEIN) CAPS capsule Take 1 capsule by mouth daily. (Patient not taking: Reported on 05/28/2019) 30 capsule 0  . naproxen (NAPROSYN) 500 MG tablet Take 1 tablet (500 mg total) by mouth 2 (two) times daily with a meal. (Patient not  taking: Reported on 05/28/2019) 20 tablet 2  . nitroGLYCERIN (NITROSTAT) 0.4 MG SL tablet Place 1 tablet (0.4 mg total) under the tongue every 5 (five) minutes as needed for chest pain. (Patient not taking: Reported on 05/28/2019) 30 tablet 5  . sacubitril-valsartan (ENTRESTO) 24-26 MG Take 1 tablet by mouth 2 (two) times daily. (Patient not taking: Reported on 05/28/2019) 60 tablet 3   No current facility-administered medications for this visit.    Past Medical History:  Diagnosis Date  . Arthritis   . Asthma   . Atherosclerosis   . BPH (benign prostatic hyperplasia)   . Carotid arterial disease ([place])   . Charcot's joint of foot, right   . Chronic combined systolic (congestive) and diastolic (congestive) heart failure ([place])    a. 02/2013 EF 50% by LV gram; b. 04/2017 Echo: EF 25-30%. diff HK. Gr2 DD. Mod dil LA/RV. PASP 24mmHg.  [name] COPD (chronic obstructive pulmonary disease) ([place])   . Coronary artery disease    a. 2013 S/P PCI of LAD [facility]);  b. 03/2013 PCI: RCA 90p ( 2.5 x 23 mm DES); c. 02/2014 Cath: patent RCA stent->Med Rx; d. 04/2017 Cath: LM nl, LAd 30ost/p, 34m/d, D1 80ost, LCX 90p/m, OM1 85, RCA 70/20p, 17m, 70d->Referred for CT Surg-felt to be poor candidate.  . Diabetic neuropathy ([place])   . Diabetic ulcer of right foot ([place])   . Hernia   . Hyperlipidemia   . Hypertension   . Ischemic cardiomyopathy    a. 04/2017 Echo: EF 25-30%; b. 08/2017 Cardiac MRI ([facility]): EF 19%, sev glob HK. RVEF 20%, mod BAE, triv MR, mild-mod TR. Basal lateral subendocardial infarct (viable), inf/infsept, dist septal ischemia, basal to mid lat peri-infarct ischemia.  . Morbid obesity ([place])   . Neuropathy   . PAD (peripheral artery disease) ([place])    a. Followed by Dr. Lucky Cowboy; b. 08/2010 Periph Angio: RSFA 70-80p (6X60 self-expanding stent); c. 09/2010 Periph Angio: L SFA 100p (7x unknown length self-expanding stent); d. 06/2015 Periph Angio: R SFA short segment occlusion (6x12 self-expanding stent).  . Restless  leg syndrome   . Sleep apnea   . Subclavian artery stenosis, right ([place])   . Syncope and collapse   . Tobacco use    a. 75+ yr hx - still smoking 1ppd, down from 2 ppd.  . Type II diabetes mellitus ([place])   . Varicose vein     Past Surgical History:  Procedure Laterality Date  . ABDOMINAL AORTAGRAM N/A 06/23/2013   Procedure: ABDOMINAL  Maxcine Ham;  Surgeon: Wellington Hampshire, MD;  Location: [facility] CATH LAB;  Service: Cardiovascular;  Laterality: N/A;  . AMPUTATION Right 04/01/2018   Procedure: AMPUTATION BELOW KNEE;  Surgeon: Algernon Huxley, MD;  Location: [facility] ORS;  Service: Vascular;  Laterality: Right;  . APPENDECTOMY    . CARDIAC CATHETERIZATION  10/14   [facility]; X1 STENT PROXIMAL RCA  . CARDIAC CATHETERIZATION  02/2010   [facility]  . CARDIAC CATHETERIZATION  02/21/2014   [facility]  . CLAVICLE SURGERY    . HERNIA REPAIR    . IRRIGATION AND DEBRIDEMENT FOOT Right 01/04/2018   Procedure: IRRIGATION AND DEBRIDEMENT FOOT;  Surgeon: Samara Deist, DPM;  Location: [facility] ORS;  Service: Podiatry;  Laterality: Right;  . LEFT HEART CATH AND CORS/GRAFTS ANGIOGRAPHY N/A 04/28/2017   Procedure: LEFT HEART CATH AND CORONARY ANGIOGRAPHY;  Surgeon: Wellington Hampshire, MD;  Location: [place] CV LAB;  Service: Cardiovascular;  Laterality: N/A;  . LOWER EXTREMITY ANGIOGRAPHY Right 03/03/2018   Procedure: LOWER EXTREMITY ANGIOGRAPHY;  Surgeon: Katha Cabal, MD;  Location: [place] CV LAB;  Service: Cardiovascular;  Laterality: Right;  . LOWER EXTREMITY ANGIOGRAPHY Left 08/24/2018   Procedure: LOWER EXTREMITY ANGIOGRAPHY;  Surgeon: Algernon Huxley, MD;  Location: [place] CV LAB;  Service: Cardiovascular;  Laterality: Left;  . PERIPHERAL ARTERIAL STENT GRAFT     x2 left/right  . PERIPHERAL VASCULAR BALLOON ANGIOPLASTY Right 01/06/2018   Procedure: PERIPHERAL VASCULAR BALLOON ANGIOPLASTY;  Surgeon: Katha Cabal, MD;  Location: [place] CV LAB;  Service: Cardiovascular;  Laterality: Right;  .  PERIPHERAL VASCULAR CATHETERIZATION N/A 07/06/2015   Procedure: Abdominal Aortogram w/Lower Extremity;  Surgeon: Algernon Huxley, MD;  Location: [place] CV LAB;  Service: Cardiovascular;  Laterality: N/A;  . PERIPHERAL VASCULAR CATHETERIZATION  07/06/2015   Procedure: Lower Extremity Intervention;  Surgeon: Algernon Huxley, MD;  Location: [place] CV LAB;  Service: Cardiovascular;;    Social History Social History   Tobacco Use  . Smoking status: Current Every Day Smoker    Packs/day: 1.00    Years: 41.00    Pack years: 41.00    Types: Cigarettes  . Smokeless tobacco: Never Used  Substance Use Topics  . Alcohol use: No  . Drug use: No     Family History  Problem Relation Age of Onset  . Heart attack Father   . Heart disease Father   . Hypertension Father   . Hyperlipidemia Father   . Hypertension Mother   . Hyperlipidemia Mother      Allergies  Allergen Reactions  . Dulaglutide Anaphylaxis, Diarrhea and Hives  . Other Itching    Skin itching associated with nitro patch  . Prednisone Rash     REVIEW OF SYSTEMS (Negative unless checked) Constitutional: [] ?Weight loss[] ?Fever[] ?Chills Cardiac:[] ?Chest pain[] ? Atrial Fibrillation[] ?Palpitations [] ?Shortness of breath when laying flat [] ?Shortness of breath with exertion. [] ?Shortness of breath at rest Vascular: [] ?Pain in legs with walking[] ?Pain in legswith standing[] ?Pain in legs when laying flat [] ?Claudication  [] ?Pain in feet when laying flat [] ?History of DVT [] ?Phlebitis [x] ?Swelling in legs [] ?Varicose veins [x] ?Non-healing ulcers Pulmonary: [] ?Uses home oxygen [] ?Productive cough[] ?Hemoptysis [] ?Wheeze [] ?COPD [] ?Asthma Neurologic: [] ?Dizziness[] ?Seizures [] ?Blackouts[] ?History of stroke [] ?History of TIA[] ?Aphasia [] ?Temporary Blindness[] ?Weaknessor numbness in arm [x] ?Weakness or numbnessin leg Musculoskeletal:[] ?Joint swelling [] ?Joint pain  [] ?Low back pain  [] ? History of Knee Replacement [x] ?Arthritis [] ?back Surgeries[] ? Spinal Stenosis  Hematologic:[] ?Easy bruising[] ?Easy bleeding [] ?Hypercoagulable state [x] ?Anemic Gastrointestinal:[] ?Diarrhea [] ?Vomiting[] ?Gastroesophageal reflux/heartburn[] ?Difficulty swallowing. [] ?Abdominal pain Genitourinary: [x] ?Chronic kidney disease [] ?Difficulturination [] ?Anuric[] ?Blood in urine [] ?Frequenturination [] ?Burning with urination[] ?Hematuria  Skin: [] ?Rashes [x] ?Ulcers [x] ?Wounds Psychological: [] ?History of anxiety[] ?History of major depression  [] ? Memory Difficulties  Physical Examination  BP 128/78 (BP Location: Right Arm)   Pulse (!) 115   Resp 17   Wt 207 lb 12.8 oz (94.3 kg)   BMI 32.55 kg/m  Gen:  WD/WN, NAD. Appears older than stated age. Head: [place]/AT, No temporalis wasting. Ear/Nose/Throat: Hearing grossly intact, nares w/o erythema or drainage Eyes: Conjunctiva clear. Sclera non-icteric Neck: Supple.  Trachea midline Pulmonary:  Good air movement, no use of accessory muscles.  Cardiac: RRR, no JVD Vascular:  Vessel Right Left  Radial Palpable Palpable                          PT Not Palpable Trace Palpable  DP Not Palpable 1+ Palpable    Musculoskeletal: M/S 5/5 throughout.  No deformity or atrophy.  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  Nickel sized ulceration on the medial left heel with pale tissue and poor granulation.  1+ left lower extremity edema. Neurologic: Sensation grossly intact in extremities.  Symmetrical.  Speech is fluent.  Psychiatric: Judgment intact, Mood & affect appropriate for pt's clinical situation. Dermatologic: Left heel ulceration as above as well as a small left lateral calf ulceration.       Labs Recent Results (from the past 2160 hour(s))  Basic metabolic panel     Status: Abnormal   Collection Time: 05/17/19  1:54 PM  Result Value Ref Range   Sodium 132 (L) 135 - 145 mmol/L    Potassium 3.9 3.5 - 5.1 mmol/L   Chloride 91 (L) 98 - 111 mmol/L   CO2 29 22 - 32 mmol/L   Glucose, Bld 185 (H) 70 - 99 mg/dL   BUN 15 6 - 20 mg/dL   Creatinine, Ser 1.00 0.61 - 1.24 mg/dL   Calcium 8.9 8.9 - 10.3 mg/dL   GFR calc non Af Amer >60 >60 mL/min   GFR calc Af Amer >60 >60 mL/min   Anion gap 12 5 - 15    Comment: Performed at [facility], [address] [place], [place] [address]  Brain natriuretic peptide     Status: Abnormal   Collection Time: 05/17/19  1:54 PM  Result Value Ref Range   B Natriuretic Peptide 1,920.0 (H) 0.0 - 100.0 pg/mL    Comment: Performed at [facility], [place], [place] [address]  SARS CORONAVIRUS 2 (TAT 6-24 HRS) Nasopharyngeal Nasopharyngeal Swab     Status: None   Collection Time: 05/20/19  1:37 PM   Specimen: Nasopharyngeal Swab  Result Value Ref Range   SARS Coronavirus 2 NEGATIVE NEGATIVE    Comment: (NOTE) SARS-CoV-2 target nucleic acids are NOT DETECTED. The SARS-CoV-2 RNA is generally detectable in upper and lower respiratory specimens during the acute phase of infection. Negative results do not preclude SARS-CoV-2 infection, do not rule out co-infections with other pathogens, and should not be used as the sole basis for treatment or other patient management decisions. Negative results must be combined with clinical observations, patient history, and epidemiological information. The expected result is Negative. Fact Sheet for Patients: SugarRoll.be Fact Sheet for Healthcare Providers: https://www.woods-mathews.com/ This test is not yet approved or cleared by the [place] FDA and  has been authorized for detection and/or diagnosis of SARS-CoV-2 by FDA under an Emergency Use Authorization (EUA). This EUA will remain  in effect (meaning this test can be used) for the duration  of the COVID-19 declaration under Section 56 4(b)(1) of the Act, 21  U.S.C. section 360bbb-3(b)(1), unless the authorization is terminated or revoked sooner. Performed at [facility], [place] [address], [place], [place] [id number]   Basic Metabolic Panel (BMET)     Status: Abnormal   Collection Time: 05/28/19  1:32 PM  Result Value Ref Range   Sodium 132 (L) 135 - 145 mmol/L   Potassium 3.7 3.5 - 5.1 mmol/L   Chloride 92 (L) 98 - 111 mmol/L   CO2 29 22 - 32 mmol/L   Glucose, Bld 103 (H) 70 - 99 mg/dL   BUN 14 6 - 20 mg/dL   Creatinine, Ser 1.09 0.61 - 1.24 mg/dL   Calcium 8.5 (L) 8.9 - 10.3 mg/dL   GFR calc non Af Amer >60 >60 mL/min   GFR calc Af Amer >60 >60 mL/min   Anion gap 11 5 - 15    Comment: Performed at [facility], [address] [place], [place] [address]  B Nat Peptide     Status: Abnormal   Collection Time: 05/28/19  1:32 PM  Result Value Ref Range   B Natriuretic Peptide 1,165.0 (H) 0.0 - 100.0 pg/mL    Comment: Performed at [facility], [address] [place], [place] [id number]  Basic metabolic panel     Status: Abnormal   Collection Time: 06/02/19  1:18 PM  Result Value Ref Range   Sodium 133 (L) 135 - 145 mmol/L   Potassium 3.4 (L) 3.5 - 5.1 mmol/L   Chloride 92 (L) 98 - 111 mmol/L   CO2 29 22 - 32 mmol/L   Glucose, Bld 133 (H) 70 - 99 mg/dL   BUN 12 6 - 20 mg/dL   Creatinine, Ser 1.04 0.61 - 1.24 mg/dL   Calcium 8.7 (L) 8.9 - 10.3 mg/dL   GFR calc non Af Amer >60 >60 mL/min   GFR calc Af Amer >60 >60 mL/min   Anion gap 12 5 - 15    Comment: Performed at [facility], [address] [place], [place] [address]  Brain natriuretic peptide     Status: Abnormal   Collection Time: 06/02/19  1:18 PM  Result Value Ref Range   B Natriuretic Peptide 1,252.0 (H) 0.0 - 100.0 pg/mL    Comment: Performed at [facility], [address] [place], [place] [id number]  Basic Metabolic Panel (BMET)     Status: Abnormal   Collection Time: 06/07/19 10:46 AM  Result Value Ref Range   Sodium  131 (L) 135 - 145 mmol/L   Potassium 4.1 3.5 - 5.1 mmol/L   Chloride 92 (L) 98 - 111 mmol/L   CO2 28 22 - 32 mmol/L   Glucose, Bld 157 (H) 70 - 99 mg/dL   BUN 17 6 - 20 mg/dL   Creatinine, Ser 1.12 0.61 - 1.24 mg/dL   Calcium 8.9 8.9 - 10.3 mg/dL   GFR calc non Af Amer >60 >60 mL/min   GFR calc Af Amer >60 >60 mL/min   Anion gap 11 5 - 15    Comment: Performed at [facility], [address] [place], [place] [address]    Radiology No results found.  Assessment/Plan  Hypertension blood pressure control important in reducing the progression of atherosclerotic disease. On appropriate oral medications.   Type 2 diabetes mellitus with complication, with long-term current use of insulin (HCC) blood glucose control important in reducing the progression of atherosclerotic disease.  Also, involved in wound healing. On appropriate medications.   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HCC) Healed, prosthesis in place  HLD (hyperlipidemia) lipid control important in reducing the progression of atherosclerotic disease. Continue statin therapy   Atherosclerosis of native arteries of the extremities with ulceration ([place]) Patient has a nonhealing ulceration with known peripheral arterial disease.  This represents a critical limb threatening situation.  He is already lost his right leg.  He has been on the schedule twice previously and I stressed the importance of getting his Covid test and having this procedure done.  We will get him on the next available opening and I have urged him to keep the appointment.  I discussed the risk and benefits the procedure.  I discussed even with revascularization limb loss is possible with his other medical comorbidities including diabetes.  The patient voices his understanding.    Leotis Pain, MD  06/08/2019 11:28 AM    This note was created with Dragon medical transcription system.  Any errors from dictation are purely  unintentional

## 2019-06-08 NOTE — Assessment & Plan Note (Signed)
blood glucose control important in reducing the progression of atherosclerotic disease. Also, involved in wound healing. On appropriate medications.  

## 2019-06-08 NOTE — Patient Instructions (Signed)
Angiogram  An angiogram is a procedure used to examine the blood vessels. In this procedure, contrast dye is injected through a long, thin tube (catheter) into an artery. X-rays are then taken, which show if there is a blockage or problem in a blood vessel. The catheter may be inserted in:  Your groin area. This is the most common.  The fold of your arm, near your elbow.  Your wrist. Tell a health care provider about:  Any allergies you have, including allergies to shellfish or contrast dye.  All medicines you are taking, including vitamins, herbs, eye drops, creams, and over-the-counter medicines.  Any problems you or family members have had with anesthetic medicines.  Any blood disorders you have.  Any surgeries you have had.  Any previous kidney problems or failure you have had.  Any medical conditions you have.  Whether you are pregnant or may be pregnant.  Whether you are breastfeeding. What are the risks? Generally, this is a safe procedure. However, problems may occur, including:  Infection or bruising at the catheter area.  Damage to other structures or organs, including rupture of blood vessels or damage to arteries.  Allergic reaction to the contrast dye used.  Kidney damage from the contrast dye used.  Blood clots that can lead to a stroke or heart attack. What happens before the procedure? Staying hydrated Follow instructions from your health care provider about hydration, which may include:  Up to 2 hours before the procedure - you may continue to drink clear liquids, such as water, clear fruit juice, black coffee, and plain tea. Eating and drinking restrictions Follow instructions from your health care provider about eating and drinking, which may include:  8 hours before the procedure - stop eating heavy meals or foods such as meat, fried foods, or fatty foods.  6 hours before the procedure - stop eating light meals or foods, such as toast or  cereal.  6 hours before the procedure - stop drinking milk or drinks that contain milk.  2 hours before the procedure - stop drinking clear liquids. General instructions  Ask your health care provider about: ? Changing or stopping your normal medicines. This is important if you take diabetes medicines or blood thinners. ? Taking medicines such as aspirin and ibuprofen. These medicines can thin your blood. Do not take these medicines before your procedure if your doctor tells you not to.  You may have blood samples taken.  Plan to have someone take you home from the hospital or clinic.  If you will be going home right after the procedure, plan to have someone with you for 24 hours. What happens during the procedure?  To reduce your risk of infection: ? Your health care team will wash or sanitize their hands. ? Your skin will be washed with soap. ? Hair may be removed from the insertion area.  You will lie on your back on an X-ray table. You may be strapped to the table if it is tilted.  An IV tube will be inserted into one of your veins.  Electrodes may be placed on your chest to monitor your heart rate during the procedure.  You will be given one or more of the following: ? A medicine to help you relax (sedative). ? A medicine to numb the area where the catheter will be inserted (local anesthetic).  The catheter will be inserted into an artery using a guide wire. A type of X-ray (fluoroscopy) will be used to help  guide the catheter to the blood vessel to be examined.  A contrast dye will then be injected into the catheter, and X-rays will be taken. The contrast will help to show where any narrowing or blockages are located in the blood vessels. You may feel flushed as the contrast dye is injected.  After the X-ray is complete, the catheter will be removed.  A bandage (dressing) will be placed over the site where the catheter was inserted. Pressure will be applied to help stop  any bleeding. The procedure may vary among health care providers and hospitals. What happens after the procedure?  Your blood pressure, heart rate, breathing rate, and blood oxygen level will be monitored until the medicines you were given have worn off.  You will be kept in bed lying flat for several hours. If the catheter was inserted through your leg, you will be instructed not to bend or cross your legs.  The insertion area and the pulse in your feet or wrist will be checked frequently.  You will be instructed to drink plenty of fluids. This will help wash the contrast dye out of your body.  Additional blood tests and X-rays may be done.  Tests to check the electrical activity in your heart (electrocardiogram) may be done.  Do not drive for 24 hours if you received a sedative.  It is up to you to get the results of your procedure. Ask your health care provider, or the department that is doing the procedure, when your results will be ready. Summary  An angiogram is a procedure used to examine the blood vessels.  In this procedure, contrast dye is injected through a long, thin tube (catheter) into an artery. X-rays are then taken.  Before the procedure, follow your health care provider's instructions about eating and drinking restrictions. You may be asked to stop eating and drinking several hours before the procedure.  After the procedure, you will need to lie flat for several hours and drink plenty of fluids. This information is not intended to replace advice given to you by your health care provider. Make sure you discuss any questions you have with your health care provider. Document Released: 03/06/2005 Document Revised: 05/09/2017 Document Reviewed: 07/03/2016 Elsevier Patient Education  2020 [employer].

## 2019-06-08 NOTE — Telephone Encounter (Signed)
Patient was seen in office today and scheduled for a left leg angio on 06/14/19 with Dr. Lucky Cowboy with a 8:15 am arrival time to the [facility]. Patient will do covid testing on 06/10/2019 before 12:00 pm at the [facility]. All pre-procedure instructions were discussed and given to the patient as well.

## 2019-06-08 NOTE — Assessment & Plan Note (Signed)
Patient has a nonhealing ulceration with known peripheral arterial disease.  This represents a critical limb threatening situation.  He is already lost his right leg.  He has been on the schedule twice previously and I stressed the importance of getting his Covid test and having this procedure done.  We will get him on the next available opening and I have urged him to keep the appointment.  I discussed the risk and benefits the procedure.  I discussed even with revascularization limb loss is possible with his other medical comorbidities including diabetes.  The patient voices his understanding.

## 2019-06-10 ENCOUNTER — Other Ambulatory Visit: Payer: Self-pay

## 2019-06-10 ENCOUNTER — Other Ambulatory Visit
Admission: RE | Admit: 2019-06-10 | Discharge: 2019-06-10 | Disposition: A | Discharge status: Home / Self Care | Payer: Medicare Other | Source: Ambulatory Visit | Attending: Vascular Surgery | Admitting: Vascular Surgery

## 2019-06-10 DIAGNOSIS — Z01812 Encounter for preprocedural laboratory examination: Secondary | ICD-10-CM | POA: Diagnosis present

## 2019-06-10 DIAGNOSIS — Z20828 Contact with and (suspected) exposure to other viral communicable diseases: Secondary | ICD-10-CM | POA: Insufficient documentation

## 2019-06-11 LAB — SARS CORONAVIRUS 2 (TAT 6-24 HRS): SARS Coronavirus 2: NEGATIVE

## 2019-06-13 ENCOUNTER — Other Ambulatory Visit (INDEPENDENT_AMBULATORY_CARE_PROVIDER_SITE_OTHER): Payer: Self-pay | Admitting: Nurse Practitioner

## 2019-06-14 ENCOUNTER — Encounter: Payer: Self-pay | Admitting: Vascular Surgery

## 2019-06-14 ENCOUNTER — Other Ambulatory Visit: Payer: Self-pay

## 2019-06-14 ENCOUNTER — Ambulatory Visit
Admission: RE | Admit: 2019-06-14 | Discharge: 2019-06-14 | Disposition: A | Discharge status: Home / Self Care | Payer: Medicare Other | Attending: Vascular Surgery | Admitting: Vascular Surgery

## 2019-06-14 ENCOUNTER — Encounter
Admission: RE | Disposition: A | Discharge status: Home / Self Care | Payer: Self-pay | Source: Home / Self Care | Attending: Vascular Surgery

## 2019-06-14 DIAGNOSIS — I255 Ischemic cardiomyopathy: Secondary | ICD-10-CM | POA: Insufficient documentation

## 2019-06-14 DIAGNOSIS — Z6832 Body mass index (BMI) 32.0-32.9, adult: Secondary | ICD-10-CM | POA: Insufficient documentation

## 2019-06-14 DIAGNOSIS — E785 Hyperlipidemia, unspecified: Secondary | ICD-10-CM | POA: Diagnosis not present

## 2019-06-14 DIAGNOSIS — I2510 Atherosclerotic heart disease of native coronary artery without angina pectoris: Secondary | ICD-10-CM | POA: Insufficient documentation

## 2019-06-14 DIAGNOSIS — I5042 Chronic combined systolic (congestive) and diastolic (congestive) heart failure: Secondary | ICD-10-CM | POA: Insufficient documentation

## 2019-06-14 DIAGNOSIS — E11621 Type 2 diabetes mellitus with foot ulcer: Secondary | ICD-10-CM | POA: Insufficient documentation

## 2019-06-14 DIAGNOSIS — E1151 Type 2 diabetes mellitus with diabetic peripheral angiopathy without gangrene: Secondary | ICD-10-CM | POA: Diagnosis not present

## 2019-06-14 DIAGNOSIS — G4730 Sleep apnea, unspecified: Secondary | ICD-10-CM | POA: Insufficient documentation

## 2019-06-14 DIAGNOSIS — L97429 Non-pressure chronic ulcer of left heel and midfoot with unspecified severity: Secondary | ICD-10-CM | POA: Insufficient documentation

## 2019-06-14 DIAGNOSIS — E1140 Type 2 diabetes mellitus with diabetic neuropathy, unspecified: Secondary | ICD-10-CM | POA: Insufficient documentation

## 2019-06-14 DIAGNOSIS — I70244 Atherosclerosis of native arteries of left leg with ulceration of heel and midfoot: Secondary | ICD-10-CM | POA: Insufficient documentation

## 2019-06-14 DIAGNOSIS — I110 Hypertensive heart disease with heart failure: Secondary | ICD-10-CM | POA: Diagnosis not present

## 2019-06-14 DIAGNOSIS — I6529 Occlusion and stenosis of unspecified carotid artery: Secondary | ICD-10-CM | POA: Insufficient documentation

## 2019-06-14 DIAGNOSIS — Z7982 Long term (current) use of aspirin: Secondary | ICD-10-CM | POA: Insufficient documentation

## 2019-06-14 DIAGNOSIS — L97909 Non-pressure chronic ulcer of unspecified part of unspecified lower leg with unspecified severity: Secondary | ICD-10-CM

## 2019-06-14 DIAGNOSIS — I70245 Atherosclerosis of native arteries of left leg with ulceration of other part of foot: Secondary | ICD-10-CM

## 2019-06-14 DIAGNOSIS — Z7902 Long term (current) use of antithrombotics/antiplatelets: Secondary | ICD-10-CM | POA: Insufficient documentation

## 2019-06-14 DIAGNOSIS — I70299 Other atherosclerosis of native arteries of extremities, unspecified extremity: Secondary | ICD-10-CM

## 2019-06-14 DIAGNOSIS — Z79899 Other long term (current) drug therapy: Secondary | ICD-10-CM | POA: Insufficient documentation

## 2019-06-14 DIAGNOSIS — Z794 Long term (current) use of insulin: Secondary | ICD-10-CM | POA: Diagnosis not present

## 2019-06-14 DIAGNOSIS — F17210 Nicotine dependence, cigarettes, uncomplicated: Secondary | ICD-10-CM | POA: Insufficient documentation

## 2019-06-14 DIAGNOSIS — J449 Chronic obstructive pulmonary disease, unspecified: Secondary | ICD-10-CM | POA: Diagnosis not present

## 2019-06-14 HISTORY — PX: LOWER EXTREMITY ANGIOGRAPHY: CATH118251

## 2019-06-14 LAB — GLUCOSE, CAPILLARY
Glucose-Capillary: 99 mg/dL (ref 70–99)
Glucose-Capillary: 85 mg/dL (ref 70–99)

## 2019-06-14 LAB — CREATININE, SERUM
Creatinine, Ser: 1.14 mg/dL (ref 0.61–1.24)
GFR calc Af Amer: >60 mL/min (ref >60)
GFR calc non Af Amer: >60 mL/min (ref >60)

## 2019-06-14 LAB — BUN: BUN: 16 mg/dL (ref 6–20)

## 2019-06-14 SURGERY — LOWER EXTREMITY ANGIOGRAPHY
Anesthesia: Moderate Sedation | Site: Leg Lower | Laterality: Left

## 2019-06-14 MED ORDER — SODIUM CHLORIDE 0.9 % IV SOLN: INTRAVENOUS | Status: DC

## 2019-06-14 MED ORDER — NITROGLYCERIN 1 MG/10 ML FOR IR/CATH LAB
INTRA_ARTERIAL | Status: DC | PRN
  Administered 2019-06-14 (×2): 200 ug

## 2019-06-14 MED ORDER — FENTANYL CITRATE (PF) 100 MCG/2ML IJ SOLN
INTRAMUSCULAR | Status: AC
  Filled 2019-06-14: qty 2

## 2019-06-14 MED ORDER — LABETALOL HCL 5 MG/ML IV SOLN: 10.0000 mg | INTRAVENOUS | Status: DC | PRN

## 2019-06-14 MED ORDER — METHYLPREDNISOLONE SODIUM SUCC 125 MG IJ SOLR: 125.0000 mg | Freq: Once | INTRAMUSCULAR | Status: DC | PRN

## 2019-06-14 MED ORDER — SODIUM CHLORIDE 0.9% FLUSH: 3.0000 mL | Freq: Two times a day (BID) | INTRAVENOUS | Status: DC

## 2019-06-14 MED ORDER — MIDAZOLAM HCL 5 MG/5ML IJ SOLN
INTRAMUSCULAR | Status: AC
  Filled 2019-06-14: qty 5

## 2019-06-14 MED ORDER — ONDANSETRON HCL 4 MG/2ML IJ SOLN: 4.0000 mg | Freq: Four times a day (QID) | INTRAMUSCULAR | Status: DC | PRN

## 2019-06-14 MED ORDER — CEFAZOLIN SODIUM-DEXTROSE 2-4 GM/100ML-% IV SOLN: 2.0000 g | Freq: Once | INTRAVENOUS | Status: AC

## 2019-06-14 MED ORDER — MIDAZOLAM HCL 2 MG/ML PO SYRP: 8.0000 mg | ORAL_SOLUTION | Freq: Once | ORAL | Status: DC | PRN

## 2019-06-14 MED ORDER — CEFAZOLIN SODIUM-DEXTROSE 2-4 GM/100ML-% IV SOLN
INTRAVENOUS | Status: AC
  Administered 2019-06-14: 09:00:00 2 g via INTRAVENOUS
  Filled 2019-06-14: qty 100

## 2019-06-14 MED ORDER — SODIUM CHLORIDE 0.9 % IV SOLN: 250.0000 mL | INTRAVENOUS | Status: DC | PRN

## 2019-06-14 MED ORDER — HYDROMORPHONE HCL 1 MG/ML IJ SOLN: 1.0000 mg | Freq: Once | INTRAMUSCULAR | Status: DC | PRN

## 2019-06-14 MED ORDER — HEPARIN SODIUM (PORCINE) 1000 UNIT/ML IJ SOLN
INTRAMUSCULAR | Status: DC | PRN
  Administered 2019-06-14: 5000 [IU] via INTRAVENOUS

## 2019-06-14 MED ORDER — FENTANYL CITRATE (PF) 100 MCG/2ML IJ SOLN
INTRAMUSCULAR | Status: DC | PRN
  Administered 2019-06-14: 50 ug via INTRAVENOUS
  Administered 2019-06-14: 25 ug via INTRAVENOUS

## 2019-06-14 MED ORDER — HEPARIN SODIUM (PORCINE) 1000 UNIT/ML IJ SOLN
INTRAMUSCULAR | Status: AC
  Filled 2019-06-14: qty 1

## 2019-06-14 MED ORDER — DIPHENHYDRAMINE HCL 50 MG/ML IJ SOLN: 50.0000 mg | Freq: Once | INTRAMUSCULAR | Status: DC | PRN

## 2019-06-14 MED ORDER — MIDAZOLAM HCL 2 MG/2ML IJ SOLN
INTRAMUSCULAR | Status: DC | PRN
  Administered 2019-06-14: 2 mg via INTRAVENOUS
  Administered 2019-06-14: 1 mg via INTRAVENOUS

## 2019-06-14 MED ORDER — FAMOTIDINE 20 MG PO TABS: 40.0000 mg | ORAL_TABLET | Freq: Once | ORAL | Status: DC | PRN

## 2019-06-14 MED ORDER — HYDRALAZINE HCL 20 MG/ML IJ SOLN: 5.0000 mg | INTRAMUSCULAR | Status: DC | PRN

## 2019-06-14 MED ORDER — IODIXANOL 320 MG/ML IV SOLN
INTRAVENOUS | Status: DC | PRN
  Administered 2019-06-14: 100 mL via INTRA_ARTERIAL

## 2019-06-14 MED ORDER — ACETAMINOPHEN 325 MG PO TABS: 650.0000 mg | ORAL_TABLET | ORAL | Status: DC | PRN

## 2019-06-14 MED ORDER — SODIUM CHLORIDE 0.9% FLUSH: 3.0000 mL | INTRAVENOUS | Status: DC | PRN

## 2019-06-14 SURGICAL SUPPLY — 22 items
BALLN LUTONIX 5X220X130 (BALLOONS) ×2
BALLN LUTONIX AV 8X60X75 (BALLOONS) ×2
BALLN ULTRVRSE 3X100X150 (BALLOONS) ×2
BALLOON LUTONIX 5X220X130 (BALLOONS) ×1 IMPLANT
BALLOON LUTONIX AV 8X60X75 (BALLOONS) ×1 IMPLANT
BALLOON ULTRVRSE 3X100X150 (BALLOONS) ×1 IMPLANT
CATH BEACON 5 .035 65 RIM TIP (CATHETERS) ×2 IMPLANT
CATH BEACON 5 .038 100 VERT TP (CATHETERS) ×2 IMPLANT
CATH PIG 70CM (CATHETERS) ×2 IMPLANT
DEVICE PRESTO INFLATION (MISCELLANEOUS) ×2 IMPLANT
DEVICE STARCLOSE SE CLOSURE (Vascular Products) ×2 IMPLANT
GLIDEWIRE ADV .035X260CM (WIRE) ×4 IMPLANT
INTRODUCER 7FR 23CM (INTRODUCER) ×2 IMPLANT
PACK ANGIOGRAPHY (CUSTOM PROCEDURE TRAY) ×2 IMPLANT
SHEATH ANL2 6FRX45 HC (SHEATH) ×2 IMPLANT
SHEATH BRITE TIP 5FRX11 (SHEATH) ×2 IMPLANT
STENT LIFESTAR 9X60 (Permanent Stent) ×2 IMPLANT
STENT LIFESTREAM 9X38X80 (Permanent Stent) ×2 IMPLANT
SYR MEDRAD MARK 7 150ML (SYRINGE) ×2 IMPLANT
TUBING CONTRAST HIGH PRESS 72 (TUBING) ×2 IMPLANT
WIRE G V18X300CM (WIRE) ×2 IMPLANT
WIRE J 3MM .035X145CM (WIRE) ×2 IMPLANT

## 2019-06-14 NOTE — Op Note (Signed)
[facility]  Percutaneous Study/Intervention Procedural Note   Date of Surgery: 06/14/2019  Surgeon(s):Kensli Bowley    Assistants:none  Pre-operative Diagnosis: PAD with ulceration left lower extremity  Post-operative diagnosis:  Same  Procedure(s) Performed:             1.  Ultrasound guidance for vascular access right femoral artery             2.  Catheter placement into left common femoral artery from right femoral approach             3.  Aortogram and selective left lower extremity angiogram             4.  Percutaneous transluminal angioplasty of left posterior tibial artery and tibioperoneal trunk with 3 mm diameter angioplasty balloon             5.  Percutaneous transluminal angioplasty of the left distal SFA and popliteal artery with 5 mm diameter Lutonix drug-coated angioplasty balloon  6.  Life star stent placement to the left external iliac artery with 9 mm diameter by 6 cm length stent postdilated with an 8 mm diameter Lutonix drug-coated balloon             7.  Lifestream stent placement to the left common iliac artery with 9 mm diameter by 38 mm length stent             8.  StarClose closure device right femoral artery  EBL: 15 cc  Contrast: 100 cc  Fluoro Time: 3.8 minutes  Moderate Conscious Sedation Time: approximately 55 minutes using 3 mg of Versed and 75 mcg of Fentanyl              Indications:  Patient is a 59 y.o.male with a nonhealing ulceration on the left foot and monophasic flow with poor waveforms on noninvasive studies. The patient is brought in for angiography for further evaluation and potential treatment.  Due to the limb threatening nature of the situation, angiogram was performed for attempted limb salvage. The patient is aware that if the procedure fails, amputation would be expected.  The patient also understands that even with successful revascularization, amputation may still be required due to the severity of the situation.   Risks and benefits are discussed and informed consent is obtained.   Procedure:  The patient was identified and appropriate procedural time out was performed.  The patient was then placed supine on the table and prepped and draped in the usual sterile fashion. Moderate conscious sedation was administered during a face to face encounter with the patient throughout the procedure with my supervision of the RN administering medicines and monitoring the patient's vital signs, pulse oximetry, telemetry and mental status throughout from the start of the procedure until the patient was taken to the recovery room. Ultrasound was used to evaluate the right common femoral artery.  It was patent .  A digital ultrasound image was acquired.  A Seldinger needle was used to access the right common femoral artery under direct ultrasound guidance and a permanent image was performed.  A 0.035 J wire was advanced without resistance and a 5Fr sheath was placed.  Pigtail catheter was placed into the aorta and an AP aortogram was performed. This demonstrated normal renal arteries and aorta which was mildly irregular.  The right external iliac artery had a 50 to 60% stenosis.  The right common iliac artery had no significant stenosis.  The left common iliac artery had  a 55 to 60% stenosis.  The left external iliac artery just beyond the iliac bifurcation had a 70 to 75% stenosis.  I then crossed the aortic bifurcation and advanced to the left femoral head. Selective left lower extremity angiogram was then performed. This demonstrated mild stenosis of the origin of the SFA and then the vessel normalized down to the distal SFA where at Hunter's canal in the above-knee popliteal artery it was heavily diseased with 80 to 90% stenosis.  The anterior tibial artery appeared to occlude in the proximal to mid segment without good reconstitution distally.  The tibioperoneal trunk had about an 80% stenosis to track down into the proximal posterior  tibial artery which was the dominant runoff to the foot.  The peroneal artery also provided additional flow distally. It was felt that it was in the patient's best interest to proceed with intervention after these images to avoid a second procedure and a larger amount of contrast and fluoroscopy based off of the findings from the initial angiogram. The patient was systemically heparinized and a 6 [place] Ansell sheath was then placed over the [employer] wire. I then used a Kumpe catheter and the advantage wire to navigate through the SFA and popliteal lesion without difficulty.  Once in the below-knee popliteal artery, exchanged for a 0.018V 18 wire and cross the stenosis in the tibioperoneal trunk and proximal posterior tibial artery without difficulty.  I then proceeded with treatment.  A 3 mm diameter by 10 cm length angioplasty balloon was inflated in the tibioperoneal trunk and proximal posterior tibial artery up to 12 atm for 1 minute.  A second inflation was required which resulted in only about a 10 to 15% residual stenosis.  The SFA and popliteal lesion was then treated with a 5 mm diameter by 22 cm length Lutonix drug-coated angioplasty balloon inflated to 10 atm for 1 minute.  Completion imaging following this showed about a 20% residual stenosis at most which was not flow-limiting with brisk flow distally.  I then exchanged for a 0.035 wire again and upsized to a 7 French sheath in the right groin.  The left external iliac artery lesion was then treated with a 9 mm diameter by 6 cm length life star stent just across the origin of the hypogastric artery.  This was then postdilated with an 8 mm diameter Lutonix drug-coated angioplasty balloon with only about a 10% residual stenosis.  The left common iliac artery lesion was then treated with a 9 mm diameter by 38 mm length lifestream stent deployed from just below the origin of the left common iliac artery down to the edge of the life star stent in the  distal left common iliac artery.  This was inflated to 12 atm.  Completion imaging showed only about a 10% residual stenosis in the common iliac artery as well.  The patient was not having any symptoms on the right leg and has a well-healed amputation on that side so intervention on the moderate iliac lesion was not performed today.  I elected to terminate the procedure. The sheath was removed and StarClose closure device was deployed in the right femoral artery with excellent hemostatic result. The patient was taken to the recovery room in stable condition having tolerated the procedure well.  Findings:               Aortogram:  This demonstrated normal renal arteries and aorta which was mildly irregular.  The right external iliac artery had a  50 to 60% stenosis.  The right common iliac artery had no significant stenosis.  The left common iliac artery had a 55 to 60% stenosis.  The left external iliac artery just beyond the iliac bifurcation had a 70 to 75% stenosis.               Left Lower Extremity: Mild stenosis of the origin of the SFA and then the vessel normalized down to the distal SFA where at Hunter's canal in the above-knee popliteal artery it was heavily diseased with 80 to 90% stenosis.  The anterior tibial artery appeared to occlude in the proximal to mid segment without good reconstitution distally.  The tibioperoneal trunk had about an 80% stenosis to track down into the proximal posterior tibial artery which was the dominant runoff to the foot.  The peroneal artery also provided additional flow distally.   Disposition: Patient was taken to the recovery room in stable condition having tolerated the procedure well.  Complications: None  Leotis Pain 06/14/2019 10:44 AM   This note was created with Dragon Medical transcription system. Any errors in dictation are purely unintentional.

## 2019-06-14 NOTE — H&P (Signed)
[facility] History & Physical Update  The patient was interviewed and re-examined.  The patient's previous History and Physical has been reviewed and is unchanged.  There is no change in the plan of care. We plan to proceed with the scheduled procedure.  Leotis Pain, MD  06/14/2019, 8:11 AM

## 2019-06-14 NOTE — Progress Notes (Signed)
Dr. Lucky Cowboy at bedside speaking with pt. &  Significant other re: procedural results. Both verbalized understanding of conversation.

## 2019-06-18 ENCOUNTER — Other Ambulatory Visit: Payer: Self-pay | Admitting: Family

## 2019-06-18 NOTE — Progress Notes (Deleted)
Cardiology Office Note    Date:  06/18/2019   ID:  [name] [name], DOB 1960/12/29, MRN [id number]  PCP:  [facility]  Cardiologist:  Ida Rogue, MD  Electrophysiologist:  None   Chief Complaint: Follow-up  History of Present Illness:   [name] [name] is a 59 y.o. male with history of ***  ***   Labs independently reviewed: 06/2019 - BUN 16, creatinine 1.14 05/2019 - potassium 4.1, BNP 1252 04/2019 - Hgb 12.3, PLT 204, albumin 3.5, AST/ALT normal, iron 27, ferritin 29 11/2018 - TC 80, TG 42, HDL 26, LDL 46   Past Medical History:  Diagnosis Date  . Arthritis   . Asthma   . Atherosclerosis   . BPH (benign prostatic hyperplasia)   . Carotid arterial disease ([place])   . Charcot's joint of foot, right   . Chronic combined systolic (congestive) and diastolic (congestive) heart failure ([place])    a. 02/2013 EF 50% by LV gram; b. 04/2017 Echo: EF 25-30%. diff HK. Gr2 DD. Mod dil LA/RV. PASP 48mmHg.  [name] COPD (chronic obstructive pulmonary disease) ([place])   . Coronary artery disease    a. 2013 S/P PCI of LAD [facility]);  b. 03/2013 PCI: RCA 90p ( 2.5 x 23 mm DES); c. 02/2014 Cath: patent RCA stent->Med Rx; d. 04/2017 Cath: LM nl, LAd 30ost/p, 39m/d, D1 80ost, LCX 90p/m, OM1 85, RCA 70/20p, 20m, 70d->Referred for CT Surg-felt to be poor candidate.  . Diabetic neuropathy ([place])   . Diabetic ulcer of right foot ([place])   . Hernia   . Hyperlipidemia   . Hypertension   . Ischemic cardiomyopathy    a. 04/2017 Echo: EF 25-30%; b. 08/2017 Cardiac MRI ([facility]): EF 19%, sev glob HK. RVEF 20%, mod BAE, triv MR, mild-mod TR. Basal lateral subendocardial infarct (viable), inf/infsept, dist septal ischemia, basal to mid lat peri-infarct ischemia.  . Morbid obesity ([place])   . Neuropathy   . PAD (peripheral artery disease) ([place])    a. Followed by Dr. Lucky Cowboy; b. 08/2010 Periph Angio: RSFA 70-80p (6X60 self-expanding stent); c. 09/2010 Periph Angio: L SFA 100p (7x unknown length self-expanding  stent); d. 06/2015 Periph Angio: R SFA short segment occlusion (6x12 self-expanding stent).  . Restless leg syndrome   . Sleep apnea   . Subclavian artery stenosis, right ([place])   . Syncope and collapse   . Tobacco use    a. 75+ yr hx - still smoking 1ppd, down from 2 ppd.  . Type II diabetes mellitus ([place])   . Varicose vein     Past Surgical History:  Procedure Laterality Date  . ABDOMINAL AORTAGRAM N/A 06/23/2013   Procedure: ABDOMINAL Maxcine Ham;  Surgeon: Wellington Hampshire, MD;  Location: [facility];  Service: Cardiovascular;  Laterality: N/A;  . AMPUTATION Right 04/01/2018   Procedure: AMPUTATION BELOW KNEE;  Surgeon: Algernon Huxley, MD;  Location: [facility] ORS;  Service: Vascular;  Laterality: Right;  . APPENDECTOMY    . CARDIAC CATHETERIZATION  10/14   [facility]; X1 STENT PROXIMAL RCA  . CARDIAC CATHETERIZATION  02/2010   [facility]  . CARDIAC CATHETERIZATION  02/21/2014   [facility]  . CLAVICLE SURGERY    . HERNIA REPAIR    . IRRIGATION AND DEBRIDEMENT FOOT Right 01/04/2018   Procedure: IRRIGATION AND DEBRIDEMENT FOOT;  Surgeon: Samara Deist, DPM;  Location: [facility] OR[facility];  Service: Podiatry;  Laterality: Right;  . LEFT HEART CATH AND CORS/GRAFTS ANGIOGRAPHY N/A 04/28/2017   Procedure: LEFT HEART CATH AND CORONARY  ANGIOGRAPHY;  Surgeon: Wellington Hampshire, MD;  Location: [facility];  Service: Cardiovascular;  Laterality: N/A;  . LOWER EXTREMITY ANGIOGRAPHY Right 03/03/2018   Procedure: LOWER EXTREMITY ANGIOGRAPHY;  Surgeon: Katha Cabal, MD;  Location: [facility];  Service: Cardiovascular;  Laterality: Right;  . LOWER EXTREMITY ANGIOGRAPHY Left 08/24/2018   Procedure: LOWER EXTREMITY ANGIOGRAPHY;  Surgeon: Algernon Huxley, MD;  Location: [facility];  Service: Cardiovascular;  Laterality: Left;  . LOWER EXTREMITY ANGIOGRAPHY Left 06/14/2019   Procedure: LOWER EXTREMITY ANGIOGRAPHY;  Surgeon: Algernon Huxley, MD;  Location: [facility];  Service: Cardiovascular;  Laterality:  Left;  . PERIPHERAL ARTERIAL STENT GRAFT     x2 left/right  . PERIPHERAL VASCULAR BALLOON ANGIOPLASTY Right 01/06/2018   Procedure: PERIPHERAL VASCULAR BALLOON ANGIOPLASTY;  Surgeon: Katha Cabal, MD;  Location: [facility];  Service: Cardiovascular;  Laterality: Right;  . PERIPHERAL VASCULAR CATHETERIZATION N/A 07/06/2015   Procedure: Abdominal Aortogram w/Lower Extremity;  Surgeon: Algernon Huxley, MD;  Location: [facility] [place] [facility];  Service: Cardiovascular;  Laterality: N/A;  . PERIPHERAL VASCULAR CATHETERIZATION  07/06/2015   Procedure: Lower Extremity Intervention;  Surgeon: Algernon Huxley, MD;  Location: [facility];  Service: Cardiovascular;;    Current Medications: No outpatient medications have been marked as taking for the 06/21/19 encounter (Appointment) with Rise Mu, PA-C.    Allergies:   Dulaglutide, Other, and Prednisone   Social History   Socioeconomic History  . Marital status: Single    Spouse name: Not on file  . Number of children: 0  . Years of education: 103  . Highest education level: 12th grade  Occupational History  . Occupation: on disability  Tobacco Use  . Smoking status: Current Every Day Smoker    Packs/day: 1.00    Years: 41.00    Pack years: 41.00    Types: Cigarettes  . Smokeless tobacco: Never Used  Substance and Sexual Activity  . Alcohol use: No  . Drug use: No  . Sexual activity: Yes  Other Topics Concern  . Not on file  Social History Narrative   Lives at home in [place] with girlfriend. Independent at baseline.   Social Determinants of Health   Financial Resource Strain: Low Risk   . Difficulty of Paying Living Expenses: Not hard at all  Food Insecurity: No Food Insecurity  . Worried About Charity fundraiser in the Last Year: Never true  . Ran Out of Food in the Last Year: Never true  Transportation Needs:   . Lack of Transportation (Medical): Not on file  . Lack of Transportation (Non-Medical): Not on  file  Physical Activity:   . Days of Exercise per Week: Not on file  . Minutes of Exercise per Session: Not on file  Stress: Stress Concern Present  . Feeling of Stress : Rather much  Social Connections:   . Frequency of Communication with Friends and Family: Not on file  . Frequency of Social Gatherings with Friends and Family: Not on file  . Attends Religious Services: Not on file  . Active Member of Clubs or Organizations: Not on file  . Attends Archivist Meetings: Not on file  . Marital Status: Not on file     Family History:  The patient's family history includes Heart attack in his father; Heart disease in his father; Hyperlipidemia in his father and mother; Hypertension in his father and mother.  ROS:   ROS   EKGs/Labs/Other Studies Reviewed:    Studies reviewed were summarized above. The additional studies were reviewed today:  2D echo 10/2018: 1. The left ventricle has severely reduced systolic function, with an ejection fraction of 20-25%. The cavity size was moderately dilated. Indeterminate diastolic filling due to E-A fusion. Left ventricular diffuse hypokinesis.  2. The right ventricle has severely reduced systolic function. The cavity was mildly enlarged. There is no increase in right ventricular wall thickness. Right ventricular systolic pressure is moderately elevated with an estimated pressure of 59.6 mmHg.  3. Left atrial size was mildly dilated.  4. Right atrial size was mildly dilated.  5. The mitral valve is degenerative. Mild thickening of the mitral valve leaflet. Mild calcification of the mitral valve leaflet. There is mild mitral annular calcification present.  6. The tricuspid valve is degenerative. Tricuspid valve regurgitation is moderate.  7. The aortic valve was not well visualized. Moderate thickening of the aortic valve. Moderate calcification of the aortic valve.  8. The inferior vena cava was dilated in size with <50% respiratory  variability. __________  LHC 04/2017:  Prox RCA-1 lesion is 70% stenosed.  Prox RCA-2 lesion is 20% stenosed.  Mid RCA lesion is 80% stenosed.  Mid RCA to Dist RCA lesion is 70% stenosed.  Ost LAD to Prox LAD lesion is 30% stenosed.  Ost 1st Diag to 1st Diag lesion is 80% stenosed.  Prox LAD to Mid LAD lesion is 60% stenosed.  Dist LAD lesion is 60% stenosed.  Prox Cx to Mid Cx lesion is 90% stenosed.  Ost 1st Mrg to 1st Mrg lesion is 85% stenosed.   1.  Significant three-vessel coronary artery disease with patent stent in the RCA and LAD.  There is significant proximal RCA disease before the stent as well as diffuse mid and distal disease in a vessel that appears to be about 2.5 mm in diameter.  There is significant bifurcation stenosis in the proximal left circumflex with a large OM1.  The LAD has moderate disease. The coronary arteries are moderately calcified and diffusely diseased throughout. 2.  Left ventricular angiography was not performed.  EF was moderately to severely reduced by echo. 3.  Severely elevated left ventricular end-diastolic pressure at 34 mmHg.  Recommendations: This is overall a difficult situation.  Revascularization options are somewhat limited due to diffuse disease overall.  Given that he is diabetic and has cardiomyopathy, best option might be CABG if he is found to be a candidate.  We need to optimize his heart failure first with outpatient referral to cardiothoracic surgery for evaluation.  I am going to increase intravenous diuresis today and the patient possibly can be discharged home tomorrow on medical therapy.   EKG:  EKG is ordered today.  The EKG ordered today demonstrates ***  Recent Labs: 11/06/2018: Hemoglobin 10.9; Platelets 275 06/02/2019: B Natriuretic Peptide 1,252.0 06/07/2019: Potassium 4.1; Sodium 131 06/14/2019: BUN 16; Creatinine, Ser 1.14  Recent Lipid Panel    Component Value Date/Time   CHOL 80 11/09/2018 0308   CHOL  211 (H) 04/05/2013 0520   TRIG 42 11/09/2018 0308   TRIG 321 (H) 04/05/2013 0520   HDL 26 (L) 11/09/2018 0308   HDL 34 (L) 04/05/2013 0520   CHOLHDL 3.1 11/09/2018 0308   VLDL 8 11/09/2018 0308   VLDL 64 (H) 04/05/2013 0520   LDLCALC 46 11/09/2018 0308   LDLCALC 113 (H) 04/05/2013 0520    PHYSICAL EXAM:    VS:  There were no vitals taken for this visit.  BMI: There is no height or weight on file to calculate BMI.  Physical Exam  Wt Readings from Last 3 Encounters:  06/14/19 219 lb (99.3 kg)  06/08/19 207 lb 12.8 oz (94.3 kg)  06/07/19 209 lb 9.6 oz (95.1 kg)     ASSESSMENT & PLAN:   1. ***  Disposition: F/u with Dr. Rockey Situ or an APP in ***.   Medication Adjustments/Labs and Tests Ordered: Current medicines are reviewed at length with the patient today.  Concerns regarding medicines are outlined above. Medication changes, Labs and Tests ordered today are summarized above and listed in the Patient Instructions accessible in Encounters.   Signed, Christell Faith, PA-C 06/18/2019 10:25 AM     [facility] [address] [place] [address] [place], [place] [address] [phone]

## 2019-06-21 ENCOUNTER — Ambulatory Visit: Payer: Medicare Other | Admitting: Physician Assistant

## 2019-06-23 NOTE — Progress Notes (Deleted)
Cardiology Office Note    Date:  06/23/2019   ID:  [name] [name], DOB 1960/06/21, MRN [id number]  PCP:  [facility]  Cardiologist:  Ida Rogue, MD  Electrophysiologist:  None   Chief Complaint: Follow-up  History of Present Illness:   [name] [name] is a 59 y.o. 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   Echo in 2016 showed normal EF of 55-60%, no RWMA, Gr1DD.   Most recent LHC from 04/2017 showed significant three-vessel CAD with patent stents in the LAD and RCA. There was significant proximal RCA disease before the stent as well as diffuse mid and distal disease in the a vessel that appeared to be about 2.5 mm in diameter. There was significant bifurcation stenosis in the proximal LCx with a large OM1 as well as moderate LAD disease as detailed below. The coronary arteries were moderately calcified and diffusely diseased throughout. Revascularization options were somewhat limited due to diffuse disease overall. Given he is a diabetic and in the setting of his cardiomyopathy, CABG was felt to be his best option, if he was found to be a candidate. He was evaluated at [facility] for CABG, though declined. Echo at that time showed an EF of 25-30%, diffuse HK, Gr2DD, moderately dilated  left atrium, mildly dilated right ventricle, PASP 34 mmHg. With medical therapy, his EF did slightly improve by echo in 12/2017 with an EF of 30-35%, mild LVH, diffuse HK, DD, mild biatrial enlargement, mildly to moderately dilated RV with normal RVSF, PASP 40-45 mmHg. He was admitted in 10/2018 with volume overload with symptoms improving with IV diuresis and a discharge weight of 187 pounds. Echo during that admission showed an EF of 20-25%, moderately LV, diffuse HK, severely reduced RVSF with mildly enlarged RV cavity, RVSP 59.6 mmHg, mild biatrial enlargement, mild mitral annular calcification, moderate TR, moderate aortic calcification. He was most recently evaluated by our office virtually in 12/2018 with weight not available. He was felt to be euvolemic. Since then, he has been followed by the [facility] and required several rounds of IV Lasix as an outpatient through short stay, most recently on 05/17/2019. He was most recently seen by the [facility] on 06/07/2019 with a weight of 209 pounds, which was down from 216 pounds from 05/17/2019. He was continued on torsemide 40 mg qAM and 20 mg qPM.  He was seen by vascular surgery 06/25/2019 in follow up for his recent lower extremity intervention with noted venous ulcer and diabetic foot infection and has been placed in an [employer], along with treatment with broad-spectrum antibiotics, and referral to podiatry.    ***   Labs independently reviewed: 06/2019 - BUN 16, creatinine 1.14 05/2019 - potassium 4.1, BNP 1252 04/2019 - Hgb 12.3, PLT 204, albumin 3.5, AST/ALT normal, iron 27, ferritin 29 11/2018 - TC 80,  TG 42, HDL 26, LDL 46  Past Medical History:  Diagnosis Date  . Arthritis   . Asthma   . Atherosclerosis   . BPH (benign prostatic hyperplasia)   . Carotid arterial disease ([place])   . Charcot's joint of foot, right   . Chronic combined systolic (congestive) and diastolic (congestive) heart failure ([place])    a. 02/2013 EF  50% by LV gram; b. 04/2017 Echo: EF 25-30%. diff HK. Gr2 DD. Mod dil LA/RV. PASP 53mmHg.  [name] COPD (chronic obstructive pulmonary disease) ([place])   . Coronary artery disease    a. 2013 S/P PCI of LAD [facility]);  b. 03/2013 PCI: RCA 90p ( 2.5 x 23 mm DES); c. 02/2014 Cath: patent RCA stent->Med Rx; d. 04/2017 Cath: LM nl, LAd 30ost/p, 9m/d, D1 80ost, LCX 90p/m, OM1 85, RCA 70/20p, 59m, 70d->Referred for CT Surg-felt to be poor candidate.  . Diabetic neuropathy ([place])   . Diabetic ulcer of right foot ([place])   . Hernia   . Hyperlipidemia   . Hypertension   . Ischemic cardiomyopathy    a. 04/2017 Echo: EF 25-30%; b. 08/2017 Cardiac MRI ([facility]): EF 19%, sev glob HK. RVEF 20%, mod BAE, triv MR, mild-mod TR. Basal lateral subendocardial infarct (viable), inf/infsept, dist septal ischemia, basal to mid lat peri-infarct ischemia.  . Morbid obesity ([place])   . Neuropathy   . PAD (peripheral artery disease) ([place])    a. Followed by Dr. Lucky Cowboy; b. 08/2010 Periph Angio: RSFA 70-80p (6X60 self-expanding stent); c. 09/2010 Periph Angio: L SFA 100p (7x unknown length self-expanding stent); d. 06/2015 Periph Angio: R SFA short segment occlusion (6x12 self-expanding stent).  . Restless leg syndrome   . Sleep apnea   . Subclavian artery stenosis, right ([address] [address])   . Syncope and collapse   . Tobacco use    a. 75+ yr hx - still smoking 1ppd, down from 2 ppd.  . Type II diabetes mellitus ([place])   . Varicose vein     Past Surgical History:  Procedure Laterality Date  . ABDOMINAL AORTAGRAM N/A 06/23/2013   Procedure: ABDOMINAL Maxcine Ham;  Surgeon: Wellington Hampshire, MD;  Location: [place] CATH LAB;  Service: Cardiovascular;  Laterality: N/A;  . AMPUTATION Right 04/01/2018   Procedure: AMPUTATION BELOW KNEE;  Surgeon: Algernon Huxley, MD;  Location: [facility] ORS;  Service: Vascular;  Laterality: Right;  . APPENDECTOMY    . CARDIAC CATHETERIZATION  10/14   [facility]; X1 STENT PROXIMAL RCA  . CARDIAC CATHETERIZATION  02/2010   [facility]  . CARDIAC  CATHETERIZATION  02/21/2014   [facility]  . CLAVICLE SURGERY    . HERNIA REPAIR    . IRRIGATION AND DEBRIDEMENT FOOT Right 01/04/2018   Procedure: IRRIGATION AND DEBRIDEMENT FOOT;  Surgeon: Samara Deist, DPM;  Location: [facility] ORS;  Service: Podiatry;  Laterality: Right;  . LEFT HEART CATH AND CORS/GRAFTS ANGIOGRAPHY N/A 04/28/2017   Procedure: LEFT HEART CATH AND CORONARY ANGIOGRAPHY;  Surgeon: Wellington Hampshire, MD;  Location: [place] CV LAB;  Service: Cardiovascular;  Laterality: N/A;  . LOWER EXTREMITY ANGIOGRAPHY Right 03/03/2018   Procedure: LOWER EXTREMITY ANGIOGRAPHY;  Surgeon: Katha Cabal, MD;  Location: [place] CV LAB;  Service: Cardiovascular;  Laterality: Right;  . LOWER EXTREMITY ANGIOGRAPHY Left 08/24/2018   Procedure: LOWER EXTREMITY ANGIOGRAPHY;  Surgeon: Algernon Huxley, MD;  Location: [place] CV LAB;  Service: Cardiovascular;  Laterality: Left;  . LOWER EXTREMITY ANGIOGRAPHY Left 06/14/2019   Procedure: LOWER EXTREMITY ANGIOGRAPHY;  Surgeon: Algernon Huxley,  MD;  Location: [place] CV LAB;  Service: Cardiovascular;  Laterality: Left;  . PERIPHERAL ARTERIAL STENT GRAFT     x2 left/right  . PERIPHERAL VASCULAR BALLOON ANGIOPLASTY Right 01/06/2018   Procedure: PERIPHERAL VASCULAR BALLOON ANGIOPLASTY;  Surgeon: Katha Cabal, MD;  Location: [place] CV LAB;  Service: Cardiovascular;  Laterality: Right;  . PERIPHERAL VASCULAR CATHETERIZATION N/A 07/06/2015   Procedure: Abdominal Aortogram w/Lower Extremity;  Surgeon: Algernon Huxley, MD;  Location: [place] CV LAB;  Service: Cardiovascular;  Laterality: N/A;  . PERIPHERAL VASCULAR CATHETERIZATION  07/06/2015   Procedure: Lower Extremity Intervention;  Surgeon: Algernon Huxley, MD;  Location: [place] CV LAB;  Service: Cardiovascular;;    Current Medications: No outpatient medications have been marked as taking for the 06/28/19 encounter (Appointment) with Rise Mu, PA-C.    Allergies:   Dulaglutide,  Other, and Prednisone   Social History   Socioeconomic History  . Marital status: Single    Spouse name: Not on file  . Number of children: 0  . Years of education: 10  . Highest education level: 12th grade  Occupational History  . Occupation: on disability  Tobacco Use  . Smoking status: Current Every Day Smoker    Packs/day: 1.00    Years: 41.00    Pack years: 41.00    Types: Cigarettes  . Smokeless tobacco: Never Used  Substance and Sexual Activity  . Alcohol use: No  . Drug use: No  . Sexual activity: Yes  Other Topics Concern  . Not on file  Social History Narrative   Lives at home in [place] with girlfriend. Independent at baseline.   Social Determinants of Health   Financial Resource Strain: Low Risk   . Difficulty of Paying Living Expenses: Not hard at all  Food Insecurity: No Food Insecurity  . Worried About Charity fundraiser in the Last Year: Never true  . Ran Out of Food in the Last Year: Never true  Transportation Needs:   . Lack of Transportation (Medical): Not on file  . Lack of Transportation (Non-Medical): Not on file  Physical Activity:   . Days of Exercise per Week: Not on file  . Minutes of Exercise per Session: Not on file  Stress: Stress Concern Present  . Feeling of Stress : Rather much  Social Connections:   . Frequency of Communication with Friends and Family: Not on file  . Frequency of Social Gatherings with Friends and Family: Not on file  . Attends Religious Services: Not on file  . Active Member of Clubs or Organizations: Not on file  . Attends Archivist Meetings: Not on file  . Marital Status: Not on file     Family History:  The patient's family history includes Heart attack in his father; Heart disease in his father; Hyperlipidemia in his father and mother; Hypertension in his father and mother.  ROS:   ROS   EKGs/Labs/Other Studies Reviewed:    Studies reviewed were summarized above. The additional studies  were reviewed today:  2D echo 10/2018: 1. The left ventricle has severely reduced systolic function, with an ejection fraction of 20-25%. The cavity size was moderately dilated. Indeterminate diastolic filling due to E-A fusion. Left ventricular diffuse hypokinesis.  2. The right ventricle has severely reduced systolic function. The cavity was mildly enlarged. There is no increase in right ventricular wall thickness. Right ventricular systolic pressure is moderately elevated with an estimated pressure of 59.6 mmHg.  3.  Left atrial size was mildly dilated.  4. Right atrial size was mildly dilated.  5. The mitral valve is degenerative. Mild thickening of the mitral valve leaflet. Mild calcification of the mitral valve leaflet. There is mild mitral annular calcification present.  6. The tricuspid valve is degenerative. Tricuspid valve regurgitation is moderate.  7. The aortic valve was not well visualized. Moderate thickening of the aortic valve. Moderate calcification of the aortic valve.  8. The inferior vena cava was dilated in size with <50% respiratory variability. __________  LHC 04/2017:  Prox RCA-1 lesion is 70% stenosed.  Prox RCA-2 lesion is 20% stenosed.  Mid RCA lesion is 80% stenosed.  Mid RCA to Dist RCA lesion is 70% stenosed.  Ost LAD to Prox LAD lesion is 30% stenosed.  Ost 1st Diag to 1st Diag lesion is 80% stenosed.  Prox LAD to Mid LAD lesion is 60% stenosed.  Dist LAD lesion is 60% stenosed.  Prox Cx to Mid Cx lesion is 90% stenosed.  Ost 1st Mrg to 1st Mrg lesion is 85% stenosed.   1.  Significant three-vessel coronary artery disease with patent stent in the RCA and LAD.  There is significant proximal RCA disease before the stent as well as diffuse mid and distal disease in a vessel that appears to be about 2.5 mm in diameter.  There is significant bifurcation stenosis in the proximal left circumflex with a large OM1.  The LAD has moderate disease. The coronary  arteries are moderately calcified and diffusely diseased throughout. 2.  Left ventricular angiography was not performed.  EF was moderately to severely reduced by echo. 3.  Severely elevated left ventricular end-diastolic pressure at 34 mmHg.  Recommendations: This is overall a difficult situation.  Revascularization options are somewhat limited due to diffuse disease overall.  Given that he is diabetic and has cardiomyopathy, best option might be CABG if he is found to be a candidate.  We need to optimize his heart failure first with outpatient referral to cardiothoracic surgery for evaluation.  I am going to increase intravenous diuresis today and the patient possibly can be discharged home tomorrow on medical therapy.   EKG:  EKG is ordered today.  The EKG ordered today demonstrates ***  Recent Labs: 11/06/2018: Hemoglobin 10.9; Platelets 275 06/02/2019: B Natriuretic Peptide 1,252.0 06/07/2019: Potassium 4.1; Sodium 131 06/14/2019: BUN 16; Creatinine, Ser 1.14  Recent Lipid Panel    Component Value Date/Time   CHOL 80 11/09/2018 0308   CHOL 211 (H) 04/05/2013 0520   TRIG 42 11/09/2018 0308   TRIG 321 (H) 04/05/2013 0520   HDL 26 (L) 11/09/2018 0308   HDL 34 (L) 04/05/2013 0520   CHOLHDL 3.1 11/09/2018 0308   VLDL 8 11/09/2018 0308   VLDL 64 (H) 04/05/2013 0520   LDLCALC 46 11/09/2018 0308   LDLCALC 113 (H) 04/05/2013 0520    PHYSICAL EXAM:    VS:  There were no vitals taken for this visit.  BMI: There is no height or weight on file to calculate BMI.  Physical Exam  Wt Readings from Last 3 Encounters:  06/14/19 219 lb (99.3 kg)  06/08/19 207 lb 12.8 oz (94.3 kg)  06/07/19 209 lb 9.6 oz (95.1 kg)     ASSESSMENT & PLAN:   1. ***  Disposition: F/u with Dr. Rockey Situ or an APP in ***.   Medication Adjustments/Labs and Tests Ordered: Current medicines are reviewed at length with the patient today.  Concerns regarding medicines are outlined above. Medication  changes, Labs  and Tests ordered today are summarized above and listed in the Patient Instructions accessible in Encounters.   Signed, Christell Faith, PA-C 06/23/2019 12:51 PM     [address], [place] [address] [phone]

## 2019-06-24 ENCOUNTER — Other Ambulatory Visit (INDEPENDENT_AMBULATORY_CARE_PROVIDER_SITE_OTHER): Payer: Self-pay | Admitting: Nurse Practitioner

## 2019-06-24 ENCOUNTER — Telehealth (INDEPENDENT_AMBULATORY_CARE_PROVIDER_SITE_OTHER): Payer: Self-pay | Admitting: Vascular Surgery

## 2019-06-24 DIAGNOSIS — Z95820 Peripheral vascular angioplasty status with implants and grafts: Secondary | ICD-10-CM

## 2019-06-24 DIAGNOSIS — L97929 Non-pressure chronic ulcer of unspecified part of left lower leg with unspecified severity: Secondary | ICD-10-CM

## 2019-06-24 NOTE — Telephone Encounter (Signed)
PATIENT SCHEDULED FOR ABI PER [facility]

## 2019-06-25 ENCOUNTER — Other Ambulatory Visit: Payer: Self-pay

## 2019-06-25 ENCOUNTER — Ambulatory Visit (INDEPENDENT_AMBULATORY_CARE_PROVIDER_SITE_OTHER): Payer: Medicare Other

## 2019-06-25 ENCOUNTER — Ambulatory Visit (INDEPENDENT_AMBULATORY_CARE_PROVIDER_SITE_OTHER): Payer: Medicare Other | Admitting: Nurse Practitioner

## 2019-06-25 ENCOUNTER — Encounter (INDEPENDENT_AMBULATORY_CARE_PROVIDER_SITE_OTHER): Payer: Self-pay | Admitting: Nurse Practitioner

## 2019-06-25 VITALS — BP 110/76 | HR 92 | Resp 15 | Wt 205.0 lb

## 2019-06-25 DIAGNOSIS — L97929 Non-pressure chronic ulcer of unspecified part of left lower leg with unspecified severity: Secondary | ICD-10-CM

## 2019-06-25 DIAGNOSIS — I83029 Varicose veins of left lower extremity with ulcer of unspecified site: Secondary | ICD-10-CM | POA: Diagnosis not present

## 2019-06-25 DIAGNOSIS — Z95820 Peripheral vascular angioplasty status with implants and grafts: Secondary | ICD-10-CM | POA: Diagnosis not present

## 2019-06-25 DIAGNOSIS — E11628 Type 2 diabetes mellitus with other skin complications: Secondary | ICD-10-CM | POA: Diagnosis not present

## 2019-06-25 DIAGNOSIS — I739 Peripheral vascular disease, unspecified: Secondary | ICD-10-CM

## 2019-06-25 DIAGNOSIS — I771 Stricture of artery: Secondary | ICD-10-CM

## 2019-06-25 DIAGNOSIS — L089 Local infection of the skin and subcutaneous tissue, unspecified: Secondary | ICD-10-CM

## 2019-06-25 MED ORDER — SULFAMETHOXAZOLE-TRIMETHOPRIM 800-160 MG PO TABS: 1.0000 | ORAL_TABLET | Freq: Two times a day (BID) | ORAL | 0 refills | Status: DC

## 2019-06-25 NOTE — Progress Notes (Signed)
SUBJECTIVE:  Patient ID: [name] [name], male    DOB: [birth year], 59 y.o.   MRN: [id number] Chief Complaint  Patient presents with  . Follow-up    lle ulcers with odor and infection    HPI  [name] [name] is a 59 y.o. male presents today after contacting our office with concern for a new left lower extremity wound.  The patient recently underwent intervention on 06/14/2019 which involved a left common iliac artery/external iliac artery stents with the left SFA, popliteal, TP trunk and PTA angioplasties.  Following this the patient did experience some reperfusion symptoms.  The patient now endorses having a wound on the medial portion of his left heel which she states that after he took his bandage a few days ago it was yellow, nasty, oozing and smelly.  Today the area is dry with no smell.  Area is slightly larger than a quarter.  The patient also has edema on the anterior surface of his calf as well.  There is also marked edema on the left lower extremity.  He denies any fever, chills, nausea, vomiting or diarrhea.  The patient also denies any numbness or tingling in his left upper extremity or extreme pain with extending usage.  Today the patient's ABI on the left lower extremity is 0.90 which is consistent with the previous ABI on 03/23/2019.  However the TBI is a little bit lower at 0.63.  When comparing the previous Doppler waveforms in the left popliteal and distal tibial arteries they are actually improved compared to this duplex on 03/23/2019.  It was also noticed that the brachial artery had a 30 mils of mercury less during the systolic readings in the right.  This is usually indicative of subclavian artery stenosis.  It is also noticed that the anterior tibial artery and the posterior tibial artery have monophasic waveforms as well.  Past Medical History:  Diagnosis Date  . Arthritis   . Asthma   . Atherosclerosis   . BPH (benign prostatic hyperplasia)   . Carotid arterial disease ([place])    . Charcot's joint of foot, right   . Chronic combined systolic (congestive) and diastolic (congestive) heart failure ([place])    a. 02/2013 EF 50% by LV gram; b. 04/2017 Echo: EF 25-30%. diff HK. Gr2 DD. Mod dil LA/RV. PASP 42mmHg.  [name] COPD (chronic obstructive pulmonary disease) ([place])   . Coronary artery disease    a. 2013 S/P PCI of LAD [facility]);  b. 03/2013 PCI: RCA 90p ( 2.5 x 23 mm DES); c. 02/2014 Cath: patent RCA stent->Med Rx; d. 04/2017 Cath: LM nl, LAd 30ost/p, 10m/d, D1 80ost, LCX 90p/m, OM1 85, RCA 70/20p, 64m, 70d->Referred for CT Surg-felt to be poor candidate.  . Diabetic neuropathy ([place])   . Diabetic ulcer of right foot ([place])   . Hernia   . Hyperlipidemia   . Hypertension   . Ischemic cardiomyopathy    a. 04/2017 Echo: EF 25-30%; b. 08/2017 Cardiac MRI ([facility]): EF 19%, sev glob HK. RVEF 20%, mod BAE, triv MR, mild-mod TR. Basal lateral subendocardial infarct (viable), inf/infsept, dist septal ischemia, basal to mid lat peri-infarct ischemia.  . Morbid obesity ([place])   . Neuropathy   . PAD (peripheral artery disease) ([place])    a. Followed by Dr. Lucky Cowboy; b. 08/2010 Periph Angio: RSFA 70-80p (6X60 self-expanding stent); c. 09/2010 Periph Angio: L SFA 100p (7x unknown length self-expanding stent); d. 06/2015 Periph Angio: R SFA short segment occlusion (6x12 self-expanding stent).  [name]  Restless leg syndrome   . Sleep apnea   . Subclavian artery stenosis, right ([place])   . Syncope and collapse   . Tobacco use    a. 75+ yr hx - still smoking 1ppd, down from 2 ppd.  . Type II diabetes mellitus ([place])   . Varicose vein     Past Surgical History:  Procedure Laterality Date  . ABDOMINAL AORTAGRAM N/A 06/23/2013   Procedure: ABDOMINAL Maxcine Ham;  Surgeon: Wellington Hampshire, MD;  Location: [place] [facility]ATH [facility];  Service: Cardiovascular;  Laterality: N/A;  . AMPUTATION Right 04/01/2018   Procedure: AMPUTATION BELOW KNEE;  Surgeon: Algernon Huxley, MD;  Location: [facility] ORS;  Service: Vascular;  Laterality: Right;  .  APPENDECTOMY    . CARDIAC CATHETERIZATION  10/14   [facility]; X1 STENT PROXIMAL RCA  . CARDIAC CATHETERIZATION  02/2010   [facility]  . CARDIAC CATHETERIZATION  02/21/2014   [facility]  . CLAVICLE SURGERY    . HERNIA REPAIR    . IRRIGATION AND DEBRIDEMENT FOOT Right 01/04/2018   Procedure: IRRIGATION AND DEBRIDEMENT FOOT;  Surgeon: Samara Deist, DPM;  Location: [facility] ORS;  Service: Podiatry;  Laterality: Right;  . LEFT HEART CATH AND CORS/GRAFTS ANGIOGRAPHY N/A 04/28/2017   Procedure: LEFT HEART CATH AND CORONARY ANGIOGRAPHY;  Surgeon: Wellington Hampshire, MD;  Location: [place] [facility];  Service: Cardiovascular;  Laterality: N/A;  . LOWER EXTREMITY ANGIOGRAPHY Right 03/03/2018   Procedure: LOWER EXTREMITY ANGIOGRAPHY;  Surgeon: Katha Cabal, MD;  Location: [place] [facility];  Service: Cardiovascular;  Laterality: Right;  . LOWER EXTREMITY ANGIOGRAPHY Left 08/24/2018   Procedure: LOWER EXTREMITY ANGIOGRAPHY;  Surgeon: Algernon Huxley, MD;  Location: [place] [facility];  Service: Cardiovascular;  Laterality: Left;  . LOWER EXTREMITY ANGIOGRAPHY Left 06/14/2019   Procedure: LOWER EXTREMITY ANGIOGRAPHY;  Surgeon: Algernon Huxley, MD;  Location: [place] [facility];  Service: Cardiovascular;  Laterality: Left;  . PERIPHERAL ARTERIAL STENT GRAFT     x2 left/right  . PERIPHERAL VASCULAR BALLOON ANGIOPLASTY Right 01/06/2018   Procedure: PERIPHERAL VASCULAR BALLOON ANGIOPLASTY;  Surgeon: Katha Cabal, MD;  Location: [place] [facility];  Service: Cardiovascular;  Laterality: Right;  . PERIPHERAL VASCULAR CATHETERIZATION N/A 07/06/2015   Procedure: Abdominal Aortogram w/Lower Extremity;  Surgeon: Algernon Huxley, MD;  Location: [place] [facility];  Service: Cardiovascular;  Laterality: N/A;  . PERIPHERAL VASCULAR CATHETERIZATION  07/06/2015   Procedure: Lower Extremity Intervention;  Surgeon: Algernon Huxley, MD;  Location: [place] [facility];  Service: Cardiovascular;;    Social History   Socioeconomic  History  . Marital status: Single    Spouse name: Not on file  . Number of children: 0  . Years of education: 42  . Highest education level: 12th grade  Occupational History  . Occupation: on disability  Tobacco Use  . Smoking status: Current Every Day Smoker    Packs/day: 1.00    Years: 41.00    Pack years: 41.00    Types: Cigarettes  . Smokeless tobacco: Never Used  Substance and Sexual Activity  . Alcohol use: No  . Drug use: No  . Sexual activity: Yes  Other Topics Concern  . Not on file  Social History Narrative   Lives at home in [place] with girlfriend. Independent at baseline.   Social Determinants of Health   Financial Resource Strain: Low Risk   . Difficulty of Paying Living Expenses: Not hard at all  Food Insecurity: No Food Insecurity  .  Worried About Charity fundraiser in the Last Year: Never true  . Ran Out of Food in the Last Year: Never true  Transportation Needs:   . Lack of Transportation (Medical): Not on file  . Lack of Transportation (Non-Medical): Not on file  Physical Activity:   . Days of Exercise per Week: Not on file  . Minutes of Exercise per Session: Not on file  Stress: Stress Concern Present  . Feeling of Stress : Rather much  Social Connections:   . Frequency of Communication with Friends and Family: Not on file  . Frequency of Social Gatherings with Friends and Family: Not on file  . Attends Religious Services: Not on file  . Active Member of Clubs or Organizations: Not on file  . Attends Archivist Meetings: Not on file  . Marital Status: Not on file  Intimate Partner Violence:   . Fear of Current or Ex-Partner: Not on file  . Emotionally Abused: Not on file  . Physically Abused: Not on file  . Sexually Abused: Not on file    Family History  Problem Relation Age of Onset  . Heart attack Father   . Heart disease Father   . Hypertension Father   . Hyperlipidemia Father   . Hypertension Mother   . Hyperlipidemia  Mother     Allergies  Allergen Reactions  . Dulaglutide Anaphylaxis, Diarrhea and Hives  . Other Itching    Skin itching associated with nitro patch  . Prednisone Rash     Review of Systems   Review of Systems: Negative Unless Checked Constitutional: [] Weight loss  [] Fever  [] Chills Cardiac: [] Chest pain   []  Atrial Fibrillation  [] Palpitations   [] Shortness of breath when laying flat   [] Shortness of breath with exertion. [] Shortness of breath at rest Vascular:  [] Pain in legs with walking   [] Pain in legs with standing [] Pain in legs when laying flat   [] Claudication    [] Pain in feet when laying flat    [] History of DVT   [] Phlebitis   [x] Swelling in legs   [] Varicose veins   [x] Non-healing ulcers Pulmonary:   [] Uses home oxygen   [] Productive cough   [] Hemoptysis   [] Wheeze  [] COPD   [] Asthma Neurologic:  [] Dizziness   [] Seizures  [] Blackouts [] History of stroke   [] History of TIA  [] Aphasia   [] Temporary Blindness   [] Weakness or numbness in arm   [] Weakness or numbness in leg Musculoskeletal:   [] Joint swelling   [] Joint pain   [] Low back pain  []  History of Knee Replacement [x] Arthritis [] back Surgeries  []  Spinal Stenosis    Hematologic:  [] Easy bruising  [] Easy bleeding   [] Hypercoagulable state   [] Anemic Gastrointestinal:  [] Diarrhea   [] Vomiting  [] Gastroesophageal reflux/heartburn   [] Difficulty swallowing. [] Abdominal pain Genitourinary:  [] Chronic kidney disease   [] Difficult urination  [] Anuric   [] Blood in urine [] Frequent urination  [] Burning with urination   [] Hematuria Skin:  [] Rashes   [x] Ulcers [x] Wounds Psychological:  [] History of anxiety   []  History of major depression  []  Memory Difficulties      OBJECTIVE:   Physical Exam  BP 110/76 (BP Location: Right Arm)   Pulse 92   Resp 15   Wt 205 lb (93 kg)   BMI 32.11 kg/m   Gen: WD/WN, NAD Head: [place]/AT, No temporalis wasting.  Ear/Nose/Throat: Hearing grossly intact, nares w/o erythema or drainage Eyes:  PER, EOMI, sclera nonicteric.  Neck: Supple, no masses.  No JVD.  Pulmonary:  Good air movement, no use of accessory muscles.  Cardiac: RRR Vascular:  Wound on medial portion of the left heel, wound on anterior surface of calf.  Distal toes cool, 2+ edema Vessel Right Left  Dorsalis Pedis  Not Palpable  Posterior Tibial  Not Palpable   Gastrointestinal: soft, non-distended. No guarding/no peritoneal signs.  Musculoskeletal: M/S 5/5 throughout.    Right below-knee amputation Neurologic: Pain and light touch intact in extremities.  Symmetrical.  Speech is fluent. Motor exam as listed above. Psychiatric: Judgment intact, Mood & affect appropriate for pt's clinical situation. Dermatologic: Stasis dermatitis. No changes consistent with cellulitis. Lymph : No Cervical lymphadenopathy, dermal thickening       ASSESSMENT AND PLAN:  1. Venous ulcer of left leg (HCC) The patient has had previous venous ulcerations in the past.  These are mostly associated with edema and given the extensive endovascular repair that was done this is a likely side effect.  Given the patient's most recent ABIs and TBI's indicates reasonable to place the patient in [place] wraps today in order to help gain control of swelling which should also help to heal the patient's anterior surface wound.  The patient will return in 1 week anyway for [place] to evaluate progression with his heel wound.  At that time we will change the patient's dressing.  2. Diabetic foot infection ([place]) Attempted to culture the wound today however the wound was very dry and unable to obtain any specimen for culture.  Therefore we placed him on a broad-spectrum antibiotic.  This wound could be a worsening due to the reperfusion that sometimes comes following peripheral vascular interventions.  It could also be due to the fact that the wound she is feeling he will due to lack of blood flow.  We will have the patient return in 1 week in order to evaluate  progress following antibiotic therapy.  We will place Aquacel on the wound underneath the [place] wraps to help with healing.  The patient also does not currently have a podiatrist given his history of numerous foot wounds and ulcers it would be prudent for him to have 1.  Will place referral for podiatry and we will appreciate their input in the patient's wound care as well.  If the patient's wound continues to heal at his next follow-up visit we may also consider an x-ray to ensure that no osteomyelitis is present. - Ambulatory referral to Podiatry  3. PAD (peripheral artery disease) ([place]) The patient's noninvasive studies today show that the ABI is nearly the same as previous.  However the tibial artery waveforms are better than the waveforms at his previous visit.  We will have the patient follow-up within about a week to see how things progress with his antibiotics.  If there is no progression we may discuss repeat angiogram to try to help the wound with healing.   4. Subclavian artery stenosis, left (HCC) Currently the patient is asymptomatic.  Based on this and the fact that the patient's lower extremity wounds and ulcerations are prior to care.  We will continue to monitor to determine if patient is needing intervention.  Discussed with the patient that things may involve numbness or tingling of the fingers, pain with extending usage or even ulcerations on the hands and fingertips.    Current Outpatient Medications on File Prior to Visit  Medication Sig Dispense Refill  . albuterol (VENTOLIN HFA) 108 (90 Base) MCG/ACT inhaler Inhale into the lungs every 6 (six) hours  as needed for wheezing or shortness of breath.    [name] aspirin EC 81 MG tablet Take 1 tablet (81 mg total) by mouth daily. 90 tablet 3  . atorvastatin (LIPITOR) 80 MG tablet Take 80 mg by mouth daily.    . calcium carbonate (TUMS - DOSED IN MG ELEMENTAL CALCIUM) 500 MG chewable tablet Chew 4 tablets by mouth daily as needed for  indigestion or heartburn.    . clindamycin (CLEOCIN) 300 MG capsule Take by mouth.    . clopidogrel (PLAVIX) 75 MG tablet Take 75 mg by mouth daily.     . diphenhydrAMINE (BENADRYL) 25 MG tablet Take 25 mg by mouth daily as needed for allergies.    [name] ENTRESTO 24-26 MG TAKE 1 TABLET BY MOUTH TWICE DAILY 60 tablet 3  . Insulin Glargine (LANTUS SOLOSTAR) 100 UNIT/ML Solostar Pen Inject 30 Units into the skin at bedtime. 15 mL 11  . insulin lispro (HUMALOG) 100 UNIT/ML injection Sliding Scale Insulin before meals using Novolog If BS is 0 to 70 no insulin and eat/glucose tablets/glucose get If BS 71 to 150 great! If BS 151 to 200, 2 Units Novolog If BS 201 to 250, 4 Units Novolog If BS 251 to 300, 6 Units Novolog If BS 301 to 350, 8 Units Novolog If BS 351 to 400, 10 Units Novolog If BS greater than 400, take 10 Units and Contact our office during office hour    . ipratropium-albuterol (DUONEB) 0.5-2.5 (3) MG/3ML SOLN Inhale 3 mLs into the lungs every 6 (six) hours as needed (wheezing/sob).     . metFORMIN (GLUCOPHAGE-XR) 500 MG 24 hr tablet Take 1,000 mg by mouth 2 (two) times daily.     . metoprolol succinate (TOPROL-XL) 25 MG 24 hr tablet Take 0.5 tablets (12.5 mg total) by mouth daily. 15 tablet 5  . Multiple Vitamin (MULTI-VITAMINS) TABS Take 1 tablet by mouth daily.     . multivitamin-lutein (OCUVITE-LUTEIN) CAPS capsule Take 1 capsule by mouth daily. 30 capsule 0  . naproxen (NAPROSYN) 500 MG tablet Take 1 tablet (500 mg total) by mouth 2 (two) times daily with a meal. 20 tablet 2  . nitroGLYCERIN (NITRODUR - DOSED IN MG/24 HR) 0.4 mg/hr patch Place 0.4 mg onto the skin daily.    . Oxycodone HCl 10 MG TABS Take 10 mg by mouth every 6 (six) hours as needed (pain).     . potassium chloride (KLOR-CON) 10 MEQ tablet Take 1 tablet (10 mEq total) by mouth daily. (Patient taking differently: Take 20 mEq by mouth daily. 79meq AM/ 72meq PM) 30 tablet 5  . promethazine (PHENERGAN) 25 MG tablet TAKE 1 TABLET  BY MOUTH EVERY 6 HOURS AS NEEDED NAUSEA    . spironolactone (ALDACTONE) 25 MG tablet Take 25 mg by mouth daily.     [name] torsemide (DEMADEX) 20 MG tablet Take 40 mg by mouth daily. 40mg  AM/ 20mg  PM    . traZODone (DESYREL) 50 MG tablet Take 150 mg by mouth at bedtime as needed for sleep.     [name] ezetimibe (ZETIA) 10 MG tablet Take 1 tablet (10 mg total) by mouth daily. (Patient not taking: Reported on 05/28/2019) 30 tablet 5  . gabapentin (NEURONTIN) 800 MG tablet Take 800 mg by mouth 4 (four) times daily.     . isosorbide mononitrate (IMDUR) 30 MG 24 hr tablet Take 1 tablet (30 mg total) by mouth daily. (Patient not taking: Reported on 05/28/2019) 30 tablet 5  . nitroGLYCERIN (NITROSTAT) 0.4 MG  SL tablet Place 1 tablet (0.4 mg total) under the tongue every 5 (five) minutes as needed for chest pain. (Patient not taking: Reported on 05/28/2019) 30 tablet 5   No current facility-administered medications on file prior to visit.    There are no Patient Instructions on file for this visit. No follow-ups on file.   Kris Hartmann, NP  This note was completed with Sales executive.  Any errors are purely unintentional.

## 2019-06-28 ENCOUNTER — Ambulatory Visit: Payer: Medicare Other | Admitting: Physician Assistant

## 2019-06-29 ENCOUNTER — Encounter (INDEPENDENT_AMBULATORY_CARE_PROVIDER_SITE_OTHER): Payer: Medicare Other

## 2019-06-29 ENCOUNTER — Ambulatory Visit (INDEPENDENT_AMBULATORY_CARE_PROVIDER_SITE_OTHER): Payer: Medicare Other | Admitting: Vascular Surgery

## 2019-07-02 ENCOUNTER — Ambulatory Visit (INDEPENDENT_AMBULATORY_CARE_PROVIDER_SITE_OTHER): Payer: Medicare Other | Admitting: Family

## 2019-07-02 ENCOUNTER — Encounter (INDEPENDENT_AMBULATORY_CARE_PROVIDER_SITE_OTHER): Payer: Self-pay | Admitting: Nurse Practitioner

## 2019-07-02 ENCOUNTER — Other Ambulatory Visit: Payer: Self-pay

## 2019-07-02 ENCOUNTER — Ambulatory Visit (INDEPENDENT_AMBULATORY_CARE_PROVIDER_SITE_OTHER): Payer: Medicare Other | Admitting: Nurse Practitioner

## 2019-07-02 ENCOUNTER — Encounter: Payer: Self-pay | Admitting: Family

## 2019-07-02 VITALS — BP 108/80 | HR 107 | Ht 67.0 in | Wt 209.0 lb

## 2019-07-02 VITALS — BP 118/80 | HR 114 | Resp 18 | Ht 67.0 in | Wt 208.0 lb

## 2019-07-02 DIAGNOSIS — L97929 Non-pressure chronic ulcer of unspecified part of left lower leg with unspecified severity: Secondary | ICD-10-CM | POA: Diagnosis not present

## 2019-07-02 DIAGNOSIS — I83029 Varicose veins of left lower extremity with ulcer of unspecified site: Secondary | ICD-10-CM | POA: Diagnosis not present

## 2019-07-02 DIAGNOSIS — I10 Essential (primary) hypertension: Secondary | ICD-10-CM | POA: Diagnosis not present

## 2019-07-02 DIAGNOSIS — I739 Peripheral vascular disease, unspecified: Secondary | ICD-10-CM | POA: Diagnosis not present

## 2019-07-02 DIAGNOSIS — Z794 Long term (current) use of insulin: Secondary | ICD-10-CM

## 2019-07-02 DIAGNOSIS — I25118 Atherosclerotic heart disease of native coronary artery with other forms of angina pectoris: Secondary | ICD-10-CM

## 2019-07-02 DIAGNOSIS — Z720 Tobacco use: Secondary | ICD-10-CM

## 2019-07-02 DIAGNOSIS — J449 Chronic obstructive pulmonary disease, unspecified: Secondary | ICD-10-CM

## 2019-07-02 DIAGNOSIS — I5022 Chronic systolic (congestive) heart failure: Secondary | ICD-10-CM | POA: Diagnosis not present

## 2019-07-02 DIAGNOSIS — E11621 Type 2 diabetes mellitus with foot ulcer: Secondary | ICD-10-CM

## 2019-07-02 DIAGNOSIS — L97509 Non-pressure chronic ulcer of other part of unspecified foot with unspecified severity: Secondary | ICD-10-CM

## 2019-07-02 MED ORDER — METOPROLOL SUCCINATE ER 25 MG PO TB24: 25.0000 mg | ORAL_TABLET | Freq: Every day | ORAL | 5 refills | Status: DC

## 2019-07-02 NOTE — Progress Notes (Signed)
Office Visit    Patient Name: [name] [name] Date of Encounter: 07/02/2019  Primary Care Provider:  [facility] Primary Cardiologist:  Ida Rogue, MD Electrophysiologist:  None   Chief Complaint    [name] [name] is a 59 y.o. male with a hx of HFrEF, tobacco abuse, HTN, PAD presents today for folow up of HFrEF   Past Medical History    Past Medical History:  Diagnosis Date  . Arthritis   . Asthma   . Atherosclerosis   . BPH (benign prostatic hyperplasia)   . Carotid arterial disease ([place])   . Charcot's joint of foot, right   . Chronic combined systolic (congestive) and diastolic (congestive) heart failure ([place])    a. 02/2013 EF 50% by LV gram; b. 04/2017 Echo: EF 25-30%. diff HK. Gr2 DD. Mod dil LA/RV. PASP 62mmHg.  [name] COPD (chronic obstructive pulmonary disease) ([place])   . Coronary artery disease    a. 2013 S/P PCI of LAD [facility]);  b. 03/2013 PCI: RCA 90p ( 2.5 x 23 mm DES); c. 02/2014 Cath: patent RCA stent->Med Rx; d. 04/2017 Cath: LM nl, LAd 30ost/p, 38m/d, D1 80ost, LCX 90p/m, OM1 85, RCA 70/20p, 11m, 70d->Referred for CT Surg-felt to be poor candidate.  . Diabetic neuropathy ([place])   . Diabetic ulcer of right foot ([place])   . Hernia   . Hyperlipidemia   . Hypertension   . Ischemic cardiomyopathy    a. 04/2017 Echo: EF 25-30%; b. 08/2017 Cardiac MRI ([facility]): EF 19%, sev glob HK. RVEF 20%, mod BAE, triv MR, mild-mod TR. Basal lateral subendocardial infarct (viable), inf/infsept, dist septal ischemia, basal to mid lat peri-infarct ischemia.  . Morbid obesity ([place])   . Neuropathy   . PAD (peripheral artery disease) ([place])    a. Followed by Dr. Lucky Cowboy; b. 08/2010 Periph Angio: RSFA 70-80p (6X60 self-expanding stent); c. 09/2010 Periph Angio: L SFA 100p (7x unknown length self-expanding stent); d. 06/2015 Periph Angio: R SFA short segment occlusion (6x12 self-expanding stent).  . Restless leg syndrome   . Sleep apnea   . Subclavian artery stenosis, right ([place])   .  Syncope and collapse   . Tobacco use    a. 75+ yr hx - still smoking 1ppd, down from 2 ppd.  . Type II diabetes mellitus ([place])   . Varicose vein    Past Surgical History:  Procedure Laterality Date  . ABDOMINAL AORTAGRAM N/A 06/23/2013   Procedure: ABDOMINAL Maxcine Ham;  Surgeon: Wellington Hampshire, MD;  Location: [place] CATH LAB;  Service: Cardiovascular;  Laterality: N/A;  . AMPUTATION Right 04/01/2018   Procedure: AMPUTATION BELOW KNEE;  Surgeon: Algernon Huxley, MD;  Location: [facility] ORS;  Service: Vascular;  Laterality: Right;  . APPENDECTOMY    . CARDIAC CATHETERIZATION  10/14   [facility]; X1 STENT PROXIMAL RCA  . CARDIAC CATHETERIZATION  02/2010   [facility]  . CARDIAC CATHETERIZATION  02/21/2014   [facility]  . CLAVICLE SURGERY    . HERNIA REPAIR    . IRRIGATION AND DEBRIDEMENT FOOT Right 01/04/2018   Procedure: IRRIGATION AND DEBRIDEMENT FOOT;  Surgeon: Samara Deist, DPM;  Location: [facility] ORS;  Service: Podiatry;  Laterality: Right;  . LEFT HEART CATH AND CORS/GRAFTS ANGIOGRAPHY N/A 04/28/2017   Procedure: LEFT HEART CATH AND CORONARY ANGIOGRAPHY;  Surgeon: Wellington Hampshire, MD;  Location: [place] CV LAB;  Service: Cardiovascular;  Laterality: N/A;  . LOWER EXTREMITY ANGIOGRAPHY Right 03/03/2018   Procedure: LOWER EXTREMITY ANGIOGRAPHY;  Surgeon: Delana Meyer,  Dolores Lory, MD;  Location: [place] CV LAB;  Service: Cardiovascular;  Laterality: Right;  . LOWER EXTREMITY ANGIOGRAPHY Left 08/24/2018   Procedure: LOWER EXTREMITY ANGIOGRAPHY;  Surgeon: Algernon Huxley, MD;  Location: [place] CV LAB;  Service: Cardiovascular;  Laterality: Left;  . LOWER EXTREMITY ANGIOGRAPHY Left 06/14/2019   Procedure: LOWER EXTREMITY ANGIOGRAPHY;  Surgeon: Algernon Huxley, MD;  Location: [place] CV LAB;  Service: Cardiovascular;  Laterality: Left;  . PERIPHERAL ARTERIAL STENT GRAFT     x2 left/right  . PERIPHERAL VASCULAR BALLOON ANGIOPLASTY Right 01/06/2018   Procedure: PERIPHERAL VASCULAR BALLOON ANGIOPLASTY;  Surgeon:  Katha Cabal, MD;  Location: [place] CV LAB;  Service: Cardiovascular;  Laterality: Right;  . PERIPHERAL VASCULAR CATHETERIZATION N/A 07/06/2015   Procedure: Abdominal Aortogram w/Lower Extremity;  Surgeon: Algernon Huxley, MD;  Location: [place] CV LAB;  Service: Cardiovascular;  Laterality: N/A;  . PERIPHERAL VASCULAR CATHETERIZATION  07/06/2015   Procedure: Lower Extremity Intervention;  Surgeon: Algernon Huxley, MD;  Location: [place] CV LAB;  Service: Cardiovascular;;   Allergies  Allergies  Allergen Reactions  . Dulaglutide Anaphylaxis, Diarrhea and Hives  . Other Itching    Skin itching associated with nitro patch  . Prednisone Rash   History of Present Illness    [name] [name] is a 59 y.o. male with a hx of CAD, chronic combined systolic and diastolic heart failure, pulmonary hypertension, COPD, tobacco abuse, HTN, HLD, obesity, OSA noncompliant with CPAP, DM2 complicated by neuropathy and LE ulceration, PAD followed by vascuar, s/p R BKA, carotid artery disease last seen by Darylene Price 05/28/19.  Follows with Dr. Lucky Cowboy of Vascular. Recently intervention 06/14/19 involving L common iliac artery/external iliac artery stent with L SFA, popliteal, TP trunk and PTA angioplasties. Following with [facility] for L heel wound. He was started on abx. He had recent ABIs and TBIs.   Last visit with Darylene Price noted that he had been starting and stopping his medications and there was some confusion regarding which medications he was on.  Reports no chest pain, pressure, tightness.  Reports his DOE is stable at his baseline.  Reports his lower extremity edema is at his baseline.  He does not feel as if he is volume overloaded at this time.  He does continue to smoke is not interested in quitting at this time.  EKGs/Labs/Other Studies Reviewed:   The following studies were reviewed today:  Cardiac cath: 04/2017 1.  Significant three-vessel coronary artery disease with patent stent in  the RCA and LAD.  There is significant proximal RCA disease before the stent as well as diffuse mid and distal disease in a vessel that appears to be about 2.5 mm in diameter.  There is significant bifurcation stenosis in the proximal left circumflex with a large OM1.  The LAD has moderate disease. The coronary arteries are moderately calcified and diffusely diseased throughout. 2.  Left ventricular angiography was not performed.  EF was moderately to severely reduced by echo. 3.  Severely elevated left ventricular end-diastolic pressure at 34 mmHg.  2D Echo 11/06/2018: 1. The left ventricle has severely reduced systolic function, with an ejection fraction of 20-25%. The cavity size was moderately dilated. Indeterminate diastolic filling due to E-A fusion. Left ventricular diffuse hypokinesis.  2. The right ventricle has severely reduced systolic function. The cavity was mildly enlarged. There is no increase in right ventricular wall thickness. Right ventricular systolic pressure is moderately elevated with an estimated pressure of 59.6  mmHg.  3. Left atrial size was mildly dilated.  4. Right atrial size was mildly dilated.  5. The mitral valve is degenerative. Mild thickening of the mitral valve leaflet. Mild calcification of the mitral valve leaflet. There is mild mitral annular calcification present.  6. The tricuspid valve is degenerative. Tricuspid valve regurgitation is moderate.  7. The aortic valve was not well visualized. Moderate thickening of the aortic valve. Moderate calcification of the aortic valve.  8. The inferior vena cava was dilated in size with <50% respiratory variability.  EKG:  EKG is ordered today.  The ekg ordered today demonstrates ST 107 bpm with PVC, left axis deviation, RBBB and T wave abnormality in inferior and lateral leads. No acute ST/T wave change.  Recent Labs: 11/06/2018: Hemoglobin 10.9; Platelets 275 06/02/2019: B Natriuretic Peptide 1,252.0 06/07/2019:  Potassium 4.1; Sodium 131 06/14/2019: BUN 16; Creatinine, Ser 1.14  Recent Lipid Panel    Component Value Date/Time   CHOL 80 11/09/2018 0308   CHOL 211 (H) 04/05/2013 0520   TRIG 42 11/09/2018 0308   TRIG 321 (H) 04/05/2013 0520   HDL 26 (L) 11/09/2018 0308   HDL 34 (L) 04/05/2013 0520   CHOLHDL 3.1 11/09/2018 0308   VLDL 8 11/09/2018 0308   VLDL 64 (H) 04/05/2013 0520   LDLCALC 46 11/09/2018 0308   LDLCALC 113 (H) 04/05/2013 0520    Home Medications   Current Meds  Medication Sig  . albuterol (VENTOLIN HFA) 108 (90 Base) MCG/ACT inhaler Inhale into the lungs every 6 (six) hours as needed for wheezing or shortness of breath.  [name] aspirin EC 81 MG tablet Take 1 tablet (81 mg total) by mouth daily.  [name] atorvastatin (LIPITOR) 80 MG tablet Take 80 mg by mouth daily.  . calcium carbonate (TUMS - DOSED IN MG ELEMENTAL CALCIUM) 500 MG chewable tablet Chew 4 tablets by mouth daily as needed for indigestion or heartburn.  . clindamycin (CLEOCIN) 300 MG capsule Take by mouth.  . clopidogrel (PLAVIX) 75 MG tablet Take 75 mg by mouth daily.   . diphenhydrAMINE (BENADRYL) 25 MG tablet Take 25 mg by mouth daily as needed for allergies.  [name] ENTRESTO 24-26 MG TAKE 1 TABLET BY MOUTH TWICE DAILY  . ezetimibe (ZETIA) 10 MG tablet Take 1 tablet (10 mg total) by mouth daily.  [name] gabapentin (NEURONTIN) 800 MG tablet Take 800 mg by mouth 4 (four) times daily.   . Insulin Glargine (LANTUS SOLOSTAR) 100 UNIT/ML Solostar Pen Inject 30 Units into the skin at bedtime.  . insulin lispro (HUMALOG) 100 UNIT/ML injection Sliding Scale Insulin before meals using Novolog If BS is 0 to 70 no insulin and eat/glucose tablets/glucose get If BS 71 to 150 great! If BS 151 to 200, 2 Units Novolog If BS 201 to 250, 4 Units Novolog If BS 251 to 300, 6 Units Novolog If BS 301 to 350, 8 Units Novolog If BS 351 to 400, 10 Units Novolog If BS greater than 400, take 10 Units and Contact our office during office hour  .  ipratropium-albuterol (DUONEB) 0.5-2.5 (3) MG/3ML SOLN Inhale 3 mLs into the lungs every 6 (six) hours as needed (wheezing/sob).   . isosorbide mononitrate (IMDUR) 30 MG 24 hr tablet Take 1 tablet (30 mg total) by mouth daily.  . metFORMIN (GLUCOPHAGE-XR) 500 MG 24 hr tablet Take 1,000 mg by mouth 2 (two) times daily.   . metoprolol succinate (TOPROL-XL) 25 MG 24 hr tablet Take 1 tablet (25 mg total) by mouth  daily.  . Multiple Vitamin (MULTI-VITAMINS) TABS Take 1 tablet by mouth daily.   . multivitamin-lutein (OCUVITE-LUTEIN) CAPS capsule Take 1 capsule by mouth daily.  . naproxen (NAPROSYN) 500 MG tablet Take 1 tablet (500 mg total) by mouth 2 (two) times daily with a meal.  . nitroGLYCERIN (NITRODUR - DOSED IN MG/24 HR) 0.4 mg/hr patch Place 0.4 mg onto the skin daily.  . nitroGLYCERIN (NITROSTAT) 0.4 MG SL tablet Place 1 tablet (0.4 mg total) under the tongue every 5 (five) minutes as needed for chest pain.  . Oxycodone HCl 10 MG TABS Take 10 mg by mouth every 6 (six) hours as needed (pain).   . potassium chloride (KLOR-CON) 10 MEQ tablet Take 1 tablet (10 mEq total) by mouth daily. (Patient taking differently: Take 20 mEq by mouth daily. 47meq AM/ 25meq PM)  . promethazine (PHENERGAN) 25 MG tablet TAKE 1 TABLET BY MOUTH EVERY 6 HOURS AS NEEDED NAUSEA  . spironolactone (ALDACTONE) 25 MG tablet Take 25 mg by mouth daily.   [name] sulfamethoxazole-trimethoprim (BACTRIM DS) 800-160 MG tablet Take 1 tablet by mouth 2 (two) times daily.  [name] torsemide (DEMADEX) 20 MG tablet Take 40 mg by mouth daily. 40mg  AM/ 20mg  PM  . traZODone (DESYREL) 50 MG tablet Take 150 mg by mouth at bedtime as needed for sleep.   . [DISCONTINUED] metoprolol succinate (TOPROL-XL) 25 MG 24 hr tablet Take 0.5 tablets (12.5 mg total) by mouth daily.      Review of Systems   Review of Systems  Constitution: Negative for chills, fever and malaise/fatigue.  Cardiovascular: Positive for dyspnea on exertion. Negative for chest pain,  leg swelling, near-syncope, orthopnea, palpitations and syncope.  Respiratory: Negative for cough, shortness of breath and wheezing.   Skin: Positive for poor wound healing.  Gastrointestinal: Negative for nausea and vomiting.  Neurological: Negative for dizziness, light-headedness and weakness.   All other systems reviewed and are otherwise negative except as noted above.  Physical Exam    VS:  BP 108/80 (BP Location: Right Arm, Patient Position: Sitting, Cuff Size: Normal)   Pulse (!) 107   Ht 5\' 7"  (1.702 m)   Wt 209 lb (94.8 kg)   SpO2 96%   BMI 32.73 kg/m  , BMI Body mass index is 32.73 kg/m. GEN: Well nourished, well developed, in no acute distress. HEENT: normal. Neck: Supple, no JVD, carotid bruits, or masses. Cardiac: teachycardic, no murmurs, rubs, or gallops. No clubbing, cyanosis. LE edema unable to be accurately evaluated due to LLE Unna boot.  Radials 2+ and equal bilaterally.  Respiratory:  Respirations regular and unlabored. Rhonchi noted to bilateral lower lobes.  GI: Soft, nontender, nondistended, BS + x 4. MS: No deformity or atrophy. RLE prosthesis is present. Skin: Warm and dry, no rash. LLE with Unna boot.  Neuro:  Strength and sensation are intact. Psych: Normal affect.  Assessment & Plan    1. CAD - No anginal symptoms.  No indication for ischemic evaluation at this time. Previously declined CABG. GDMT includes aspirin, Plavix, beta-blocker, statin, Imdur. Uses nitroglycerin patch with good relief. Does not have to use PRN nitro tablets.   2. Chronic systolic CHF/Pulmonary HTN - Well compensated on exam. Dry weight unclear. Does not feel he he holding onto fluid.  GDMT: Entresto resumed 05/30/2019 at HF clinic, continue. Increase Toprol to 25mg  daily due to tachycardia. Continue Metoprolol, K, Torsemide 40mg  AM 20mg  PM, Spironolactone 43m daily. Doubtful Entresto will be able to be titrated to max dose due  to relative hypotension.   BMP today.   Continue  low-sodium diet  Recommend fluid restriction less than 2 L.  3. COPD -reports some recent congestion.  Bilateral lower lobes with rhonchi.  Encouraged him to use Mucinex.  He is already on antibiotics (Bactrim) in the setting of LLE wound, as below.  4. Tobacco abuse - Smoking cessation encouraged. Recommend utilization of [id number].  5. HTN -BP well controlled on low end of normal.  No lightheadedness, dizziness.  Continue present antihypertensive regimen.  6. HLD -LDL goal less than 70.  Continue atorvastatin 80 mg daily and Zetia 10 mg daily.  7. DM2 - Follows with primary care.  Contributory to poor wound healing, as below  8. PAD -Follows with vascular surgery.  06/14/2019 iliac artery stent and multiple LLE angioplasty.  He is s/p remote R BKA.  Following with vascular for left heel wound, presently with intubated and on antibiotics.  Disposition: Follow up in 1 month(s) with Darylene Price, NP and in 2 months with Dr. Rockey Situ.    Loel Dubonnet, NP 07/02/2019, 7:53 PM

## 2019-07-02 NOTE — Patient Instructions (Addendum)
Medication Instructions:  Your physician has recommended you make the following change in your medication:   CHANGE your Toprol XL to 25mg  (one tablet) daily  *If you need a refill on your cardiac medications before your next appointment, please call your pharmacy*  Lab Work: Your physician recommends that you return for lab work today: BMET  If you have labs (blood work) drawn today and your tests are completely normal, you will receive your results only by: [name] MyChart Message (if you have MyChart) OR . A paper copy in the mail If you have any lab test that is abnormal or we need to change your treatment, we will call you to review the results.  Testing/Procedures: You had an EKG today. It was stable compared to your previous EKGs. This is a good result.   Follow-Up: At [facility], you and your health needs are our priority.  As part of our continuing mission to provide you with exceptional heart care, we have created designated Provider Care Teams.  These Care Teams include your primary Cardiologist (physician) and Advanced Practice Providers (APPs -  Physician Assistants and Nurse Practitioners) who all work together to provide you with the care you need, when you need it.  Your next appointment:  1- In 1 month with Darylene Price, NP.   2- 2 months with Dr. Rockey Situ

## 2019-07-03 LAB — BASIC METABOLIC PANEL
BUN/Creatinine Ratio: 10 (ref 9–20)
BUN: 10 mg/dL (ref 6–24)
CO2: 21 mmol/L (ref 20–29)
Calcium: 9.4 mg/dL (ref 8.7–10.2)
Chloride: 94 mmol/L — ABNORMAL LOW (ref 96–106)
Creatinine, Ser: 0.98 mg/dL (ref 0.76–1.27)
GFR calc Af Amer: 99 mL/min/{1.73_m2} (ref >59)
GFR calc non Af Amer: 86 mL/min/{1.73_m2} (ref >59)
Glucose: 100 mg/dL — ABNORMAL HIGH (ref 65–99)
Potassium: 4.9 mmol/L (ref 3.5–5.2)
Sodium: 131 mmol/L — ABNORMAL LOW (ref 134–144)

## 2019-07-05 ENCOUNTER — Telehealth: Payer: Self-pay

## 2019-07-05 NOTE — Telephone Encounter (Signed)
Called and spoke with pt regarding his labs. Lab results were given as per [facility] recommendations. Pt verbalized understanding and had no further questions.

## 2019-07-06 ENCOUNTER — Encounter (INDEPENDENT_AMBULATORY_CARE_PROVIDER_SITE_OTHER): Payer: Self-pay | Admitting: Nurse Practitioner

## 2019-07-06 NOTE — Progress Notes (Signed)
SUBJECTIVE:  Patient ID: [name] [name], male    DOB: 1960/12/06, 59 y.o.   MRN: [id number] Chief Complaint  Patient presents with  . Follow-up    HPI  [name] [name] is a 59 y.o. male that presents today to follow up regarding wounds on the left lower extremity.  After antibiotics the wound on the left medial heel has significantly less drainage and looks smaller in diameter.  The venous ulcer on the left leg also looks better after a week of Unna Wraps.  The patient denies fever, chills or signs of systemic infection.  He is tolerating the [employer] well.    Past Medical History:  Diagnosis Date  . Arthritis   . Asthma   . Atherosclerosis   . BPH (benign prostatic hyperplasia)   . Carotid arterial disease ([place])   . Charcot's joint of foot, right   . Chronic combined systolic (congestive) and diastolic (congestive) heart failure ([place])    a. 02/2013 EF 50% by LV gram; b. 04/2017 Echo: EF 25-30%. diff HK. Gr2 DD. Mod dil LA/RV. PASP 57mmHg.  [name] COPD (chronic obstructive pulmonary disease) ([place])   . Coronary artery disease    a. 2013 S/P PCI of LAD [facility]);  b. 03/2013 PCI: RCA 90p ( 2.5 x 23 mm DES); c. 02/2014 Cath: patent RCA stent->Med Rx; d. 04/2017 Cath: LM nl, LAd 30ost/p, 87m/d, D1 80ost, LCX 90p/m, OM1 85, RCA 70/20p, 70m, 70d->Referred for CT Surg-felt to be poor candidate.  . Diabetic neuropathy ([place])   . Diabetic ulcer of right foot ([place])   . Hernia   . Hyperlipidemia   . Hypertension   . Ischemic cardiomyopathy    a. 04/2017 Echo: EF 25-30%; b. 08/2017 Cardiac MRI ([facility]): EF 19%, sev glob HK. RVEF 20%, mod BAE, triv MR, mild-mod TR. Basal lateral subendocardial infarct (viable), inf/infsept, dist septal ischemia, basal to mid lat peri-infarct ischemia.  . Morbid obesity ([place])   . Neuropathy   . PAD (peripheral artery disease) ([place])    a. Followed by Dr. Lucky Cowboy; b. 08/2010 Periph Angio: RSFA 70-80p (6X60 self-expanding stent); c. 09/2010 Periph Angio: L SFA 100p (7x unknown length  self-expanding stent); d. 06/2015 Periph Angio: R SFA short segment occlusion (6x12 self-expanding stent).  . Restless leg syndrome   . Sleep apnea   . Subclavian artery stenosis, right ([place])   . Syncope and collapse   . Tobacco use    a. 75+ yr hx - still smoking 1ppd, down from 2 ppd.  . Type II diabetes mellitus ([place])   . Varicose vein     Past Surgical History:  Procedure Laterality Date  . ABDOMINAL AORTAGRAM N/A 06/23/2013   Procedure: ABDOMINAL Maxcine Ham;  Surgeon: Wellington Hampshire, MD;  Location: [place] CATH LAB;  Service: Cardiovascular;  Laterality: N/A;  . AMPUTATION Right 04/01/2018   Procedure: AMPUTATION BELOW KNEE;  Surgeon: Algernon Huxley, MD;  Location: [facility] ORS;  Service: Vascular;  Laterality: Right;  . APPENDECTOMY    . CARDIAC CATHETERIZATION  10/14   [facility]; X1 STENT PROXIMAL RCA  . CARDIAC CATHETERIZATION  02/2010   [facility]  . CARDIAC CATHETERIZATION  02/21/2014   [facility]  . CLAVICLE SURGERY    . HERNIA REPAIR    . IRRIGATION AND DEBRIDEMENT FOOT Right 01/04/2018   Procedure: IRRIGATION AND DEBRIDEMENT FOOT;  Surgeon: Samara Deist, DPM;  Location: [facility] ORS;  Service: Podiatry;  Laterality: Right;  . LEFT HEART CATH AND CORS/GRAFTS ANGIOGRAPHY N/A 04/28/2017  Procedure: LEFT HEART CATH AND CORONARY ANGIOGRAPHY;  Surgeon: Wellington Hampshire, MD;  Location: [place] CV LAB;  Service: Cardiovascular;  Laterality: N/A;  . LOWER EXTREMITY ANGIOGRAPHY Right 03/03/2018   Procedure: LOWER EXTREMITY ANGIOGRAPHY;  Surgeon: Katha Cabal, MD;  Location: [place] CV LAB;  Service: Cardiovascular;  Laterality: Right;  . LOWER EXTREMITY ANGIOGRAPHY Left 08/24/2018   Procedure: LOWER EXTREMITY ANGIOGRAPHY;  Surgeon: Algernon Huxley, MD;  Location: [place] CV LAB;  Service: Cardiovascular;  Laterality: Left;  . LOWER EXTREMITY ANGIOGRAPHY Left 06/14/2019   Procedure: LOWER EXTREMITY ANGIOGRAPHY;  Surgeon: Algernon Huxley, MD;  Location: [place] CV LAB;  Service:  Cardiovascular;  Laterality: Left;  . PERIPHERAL ARTERIAL STENT GRAFT     x2 left/right  . PERIPHERAL VASCULAR BALLOON ANGIOPLASTY Right 01/06/2018   Procedure: PERIPHERAL VASCULAR BALLOON ANGIOPLASTY;  Surgeon: Katha Cabal, MD;  Location: [place] CV LAB;  Service: Cardiovascular;  Laterality: Right;  . PERIPHERAL VASCULAR CATHETERIZATION N/A 07/06/2015   Procedure: Abdominal Aortogram w/Lower Extremity;  Surgeon: Algernon Huxley, MD;  Location: [place] CV LAB;  Service: Cardiovascular;  Laterality: N/A;  . PERIPHERAL VASCULAR CATHETERIZATION  07/06/2015   Procedure: Lower Extremity Intervention;  Surgeon: Algernon Huxley, MD;  Location: [place] CV LAB;  Service: Cardiovascular;;    Social History   Socioeconomic History  . Marital status: Single    Spouse name: Not on file  . Number of children: 0  . Years of education: 28  . Highest education level: 12th grade  Occupational History  . Occupation: on disability  Tobacco Use  . Smoking status: Current Every Day Smoker    Packs/day: 1.00    Years: 41.00    Pack years: 41.00    Types: Cigarettes  . Smokeless tobacco: Never Used  Substance and Sexual Activity  . Alcohol use: No  . Drug use: No  . Sexual activity: Yes  Other Topics Concern  . Not on file  Social History Narrative   Lives at home in [place] with girlfriend. Independent at baseline.   Social Determinants of Health   Financial Resource Strain: Low Risk   . Difficulty of Paying Living Expenses: Not hard at all  Food Insecurity: No Food Insecurity  . Worried About Charity fundraiser in the Last Year: Never true  . Ran Out of Food in the Last Year: Never true  Transportation Needs:   . Lack of Transportation (Medical): Not on file  . Lack of Transportation (Non-Medical): Not on file  Physical Activity:   . Days of Exercise per Week: Not on file  . Minutes of Exercise per Session: Not on file  Stress: Stress Concern Present  . Feeling of  Stress : Rather much  Social Connections:   . Frequency of Communication with Friends and Family: Not on file  . Frequency of Social Gatherings with Friends and Family: Not on file  . Attends Religious Services: Not on file  . Active Member of Clubs or Organizations: Not on file  . Attends Archivist Meetings: Not on file  . Marital Status: Not on file  Intimate Partner Violence:   . Fear of Current or Ex-Partner: Not on file  . Emotionally Abused: Not on file  . Physically Abused: Not on file  . Sexually Abused: Not on file    Family History  Problem Relation Age of Onset  . Heart attack Father   . Heart disease Father   .  Hypertension Father   . Hyperlipidemia Father   . Hypertension Mother   . Hyperlipidemia Mother     Allergies  Allergen Reactions  . Dulaglutide Anaphylaxis, Diarrhea and Hives  . Other Itching    Skin itching associated with nitro patch  . Prednisone Rash     Review of Systems   Review of Systems: Negative Unless Checked Constitutional: [] Weight loss  [] Fever  [] Chills Cardiac: [] Chest pain   []  Atrial Fibrillation  [] Palpitations   [] Shortness of breath when laying flat   [] Shortness of breath with exertion. [] Shortness of breath at rest Vascular:  [] Pain in legs with walking   [] Pain in legs with standing [] Pain in legs when laying flat   [] Claudication    [] Pain in feet when laying flat    [] History of DVT   [] Phlebitis   [x] Swelling in legs   [] Varicose veins   [] Non-healing ulcers Pulmonary:   [] Uses home oxygen   [] Productive cough   [] Hemoptysis   [] Wheeze  [] COPD   [] Asthma Neurologic:  [] Dizziness   [] Seizures  [] Blackouts [] History of stroke   [] History of TIA  [] Aphasia   [] Temporary Blindness   [] Weakness or numbness in arm   [x] Weakness or numbness in leg Musculoskeletal:   [] Joint swelling   [] Joint pain   [] Low back pain  []  History of Knee Replacement [x] Arthritis [] back Surgeries  []  Spinal Stenosis    Hematologic:  [] Easy  bruising  [] Easy bleeding   [] Hypercoagulable state   [] Anemic Gastrointestinal:  [] Diarrhea   [] Vomiting  [] Gastroesophageal reflux/heartburn   [] Difficulty swallowing. [] Abdominal pain Genitourinary:  [] Chronic kidney disease   [] Difficult urination  [] Anuric   [] Blood in urine [] Frequent urination  [] Burning with urination   [] Hematuria Skin:  [] Rashes   [] Ulcers [x] Wounds Psychological:  [] History of anxiety   []  History of major depression  []  Memory Difficulties      OBJECTIVE:   Physical Exam  BP 118/80 (BP Location: Right Arm)   Pulse (!) 114   Resp 18   Ht 5\' 7"  (1.702 m)   Wt 208 lb (94.3 kg)   BMI 32.58 kg/m   Gen: WD/WN, NAD Head: [place]/AT, No temporalis wasting.  Ear/Nose/Throat: Hearing grossly intact, nares w/o erythema or drainage Eyes: PER, EOMI, sclera nonicteric.  Neck: Supple, no masses.  No JVD.  Pulmonary:  Good air movement, no use of accessory muscles.  Cardiac: RRR Vascular: wound on left medial heel area about 1 inch x 1.5 inch  Vessel Right Left  Radial Palpable Palpable  Dorsalis Pedis  Not Palpable  Posterior Tibial  Not Palpable   Gastrointestinal: soft, non-distended. No guarding/no peritoneal signs.  Musculoskeletal: M/S 5/5 throughout.  Right AKA  Neurologic: Pain and light touch intact in extremities.  Symmetrical.  Speech is fluent. Motor exam as listed above. Psychiatric: Judgment intact, Mood & affect appropriate for pt's clinical situation. Dermatologic: No Venous rashes. No Ulcers Noted.  No changes consistent with cellulitis. Lymph : No Cervical lymphadenopathy, no lichenification or skin changes of chronic lymphedema.       ASSESSMENT AND PLAN:  1. Ulcer of left lower extremity, unspecified ulcer stage ([place]) We will defer patient to podiatry for more specialized treatment for heel wound.  We will also get xray to ensure there are no signs and symptoms of osteomyelitis present.  We will continue with using Aquacel Ag at the wound to  help with healing.  - Ambulatory referral to Podiatry - XR Os [place] Right; Future  2. PAD (  peripheral artery disease) ([place]) We will continue to monitor patient's wound very closely.  It appears better than it did but any sign that it is not healing or worsening, repeat intervention will be necessary.   3. Tobacco use Discussed smoking cessation. Previous interventions do not last for long with continued smoking.    4. Venous ulcer of left leg ([place]) Will continue use of Unna wraps.  Ulceration appearing much better from previous week, we will continue close monitoring with weekly changes.   Current Outpatient Medications on File Prior to Visit  Medication Sig Dispense Refill  . albuterol (VENTOLIN HFA) 108 (90 Base) MCG/ACT inhaler Inhale into the lungs every 6 (six) hours as needed for wheezing or shortness of breath.    [name] aspirin EC 81 MG tablet Take 1 tablet (81 mg total) by mouth daily. 90 tablet 3  . atorvastatin (LIPITOR) 80 MG tablet Take 80 mg by mouth daily.    . calcium carbonate (TUMS - DOSED IN MG ELEMENTAL CALCIUM) 500 MG chewable tablet Chew 4 tablets by mouth daily as needed for indigestion or heartburn.    . clindamycin (CLEOCIN) 300 MG capsule Take by mouth.    . clopidogrel (PLAVIX) 75 MG tablet Take 75 mg by mouth daily.     . diphenhydrAMINE (BENADRYL) 25 MG tablet Take 25 mg by mouth daily as needed for allergies.    [name] ENTRESTO 24-26 MG TAKE 1 TABLET BY MOUTH TWICE DAILY 60 tablet 3  . ezetimibe (ZETIA) 10 MG tablet Take 1 tablet (10 mg total) by mouth daily. 30 tablet 5  . gabapentin (NEURONTIN) 800 MG tablet Take 800 mg by mouth 4 (four) times daily.     . Insulin Glargine (LANTUS SOLOSTAR) 100 UNIT/ML Solostar Pen Inject 30 Units into the skin at bedtime. 15 mL 11  . insulin lispro (HUMALOG) 100 UNIT/ML injection Sliding Scale Insulin before meals using Novolog If BS is 0 to 70 no insulin and eat/glucose tablets/glucose get If BS 71 to 150 great! If BS 151 to 200, 2  Units Novolog If BS 201 to 250, 4 Units Novolog If BS 251 to 300, 6 Units Novolog If BS 301 to 350, 8 Units Novolog If BS 351 to 400, 10 Units Novolog If BS greater than 400, take 10 Units and Contact our office during office hour    . ipratropium-albuterol (DUONEB) 0.5-2.5 (3) MG/3ML SOLN Inhale 3 mLs into the lungs every 6 (six) hours as needed (wheezing/sob).     . isosorbide mononitrate (IMDUR) 30 MG 24 hr tablet Take 1 tablet (30 mg total) by mouth daily. 30 tablet 5  . metFORMIN (GLUCOPHAGE-XR) 500 MG 24 hr tablet Take 1,000 mg by mouth 2 (two) times daily.     . Multiple Vitamin (MULTI-VITAMINS) TABS Take 1 tablet by mouth daily.     . multivitamin-lutein (OCUVITE-LUTEIN) CAPS capsule Take 1 capsule by mouth daily. 30 capsule 0  . naproxen (NAPROSYN) 500 MG tablet Take 1 tablet (500 mg total) by mouth 2 (two) times daily with a meal. 20 tablet 2  . nitroGLYCERIN (NITRODUR - DOSED IN MG/24 HR) 0.4 mg/hr patch Place 0.4 mg onto the skin daily.    . nitroGLYCERIN (NITROSTAT) 0.4 MG SL tablet Place 1 tablet (0.4 mg total) under the tongue every 5 (five) minutes as needed for chest pain. 30 tablet 5  . Oxycodone HCl 10 MG TABS Take 10 mg by mouth every 6 (six) hours as needed (pain).     . potassium  chloride (KLOR-CON) 10 MEQ tablet Take 1 tablet (10 mEq total) by mouth daily. (Patient taking differently: Take 20 mEq by mouth daily. 79meq AM/ 53meq PM) 30 tablet 5  . promethazine (PHENERGAN) 25 MG tablet TAKE 1 TABLET BY MOUTH EVERY 6 HOURS AS NEEDED NAUSEA    . spironolactone (ALDACTONE) 25 MG tablet Take 25 mg by mouth daily.     [name] sulfamethoxazole-trimethoprim (BACTRIM DS) 800-160 MG tablet Take 1 tablet by mouth 2 (two) times daily. 28 tablet 0  . torsemide (DEMADEX) 20 MG tablet Take 40 mg by mouth daily. 40mg  AM/ 20mg  PM    . traZODone (DESYREL) 50 MG tablet Take 150 mg by mouth at bedtime as needed for sleep.      No current facility-administered medications on file prior to visit.     There are no Patient Instructions on file for this visit. No follow-ups on file.   Kris Hartmann, NP  This note was completed with Sales executive.  Any errors are purely unintentional.

## 2019-07-08 ENCOUNTER — Ambulatory Visit: Payer: Medicare Other | Admitting: Podiatry

## 2019-07-09 ENCOUNTER — Encounter (INDEPENDENT_AMBULATORY_CARE_PROVIDER_SITE_OTHER): Payer: Medicare Other

## 2019-07-14 ENCOUNTER — Ambulatory Visit: Payer: Medicare Other | Admitting: Physician Assistant

## 2019-07-16 ENCOUNTER — Ambulatory Visit (INDEPENDENT_AMBULATORY_CARE_PROVIDER_SITE_OTHER): Payer: Medicare Other | Admitting: Nurse Practitioner

## 2019-07-16 ENCOUNTER — Other Ambulatory Visit: Payer: Self-pay

## 2019-07-16 VITALS — BP 127/84 | HR 111 | Resp 16 | Ht 67.0 in | Wt 206.0 lb

## 2019-07-16 DIAGNOSIS — I83029 Varicose veins of left lower extremity with ulcer of unspecified site: Secondary | ICD-10-CM

## 2019-07-16 DIAGNOSIS — L97929 Non-pressure chronic ulcer of unspecified part of left lower leg with unspecified severity: Secondary | ICD-10-CM | POA: Diagnosis not present

## 2019-07-16 NOTE — Progress Notes (Signed)
History of Present Illness  There is no documented history at this time  Assessments & Plan   There are no diagnoses linked to this encounter.    Additional instructions  Subjective:  Patient presents with venous ulcer of the Left lower extremity.    Procedure:  3 layer unna wrap was placed Left lower extremity.   Plan:   Follow up in one week.  

## 2019-07-19 ENCOUNTER — Other Ambulatory Visit (INDEPENDENT_AMBULATORY_CARE_PROVIDER_SITE_OTHER): Payer: Self-pay | Admitting: Nurse Practitioner

## 2019-07-19 ENCOUNTER — Encounter (INDEPENDENT_AMBULATORY_CARE_PROVIDER_SITE_OTHER): Payer: Self-pay | Admitting: Nurse Practitioner

## 2019-07-19 DIAGNOSIS — L97929 Non-pressure chronic ulcer of unspecified part of left lower leg with unspecified severity: Secondary | ICD-10-CM

## 2019-07-20 ENCOUNTER — Encounter (INDEPENDENT_AMBULATORY_CARE_PROVIDER_SITE_OTHER): Payer: Medicare Other

## 2019-07-20 ENCOUNTER — Encounter (INDEPENDENT_AMBULATORY_CARE_PROVIDER_SITE_OTHER): Payer: Self-pay

## 2019-07-20 ENCOUNTER — Ambulatory Visit (INDEPENDENT_AMBULATORY_CARE_PROVIDER_SITE_OTHER): Payer: Medicare Other | Admitting: Vascular Surgery

## 2019-07-22 ENCOUNTER — Encounter (INDEPENDENT_AMBULATORY_CARE_PROVIDER_SITE_OTHER): Payer: Self-pay | Admitting: Nurse Practitioner

## 2019-07-22 ENCOUNTER — Ambulatory Visit (INDEPENDENT_AMBULATORY_CARE_PROVIDER_SITE_OTHER): Payer: Medicare Other | Admitting: Nurse Practitioner

## 2019-07-22 ENCOUNTER — Other Ambulatory Visit: Payer: Self-pay

## 2019-07-22 ENCOUNTER — Ambulatory Visit (INDEPENDENT_AMBULATORY_CARE_PROVIDER_SITE_OTHER): Payer: Medicare Other

## 2019-07-22 VITALS — BP 128/83 | HR 101 | Resp 12 | Ht 67.0 in | Wt 209.0 lb

## 2019-07-22 DIAGNOSIS — L97929 Non-pressure chronic ulcer of unspecified part of left lower leg with unspecified severity: Secondary | ICD-10-CM

## 2019-07-22 DIAGNOSIS — L089 Local infection of the skin and subcutaneous tissue, unspecified: Secondary | ICD-10-CM | POA: Diagnosis not present

## 2019-07-22 DIAGNOSIS — I7025 Atherosclerosis of native arteries of other extremities with ulceration: Secondary | ICD-10-CM | POA: Diagnosis not present

## 2019-07-22 DIAGNOSIS — I10 Essential (primary) hypertension: Secondary | ICD-10-CM

## 2019-07-22 DIAGNOSIS — E11628 Type 2 diabetes mellitus with other skin complications: Secondary | ICD-10-CM

## 2019-07-22 MED ORDER — SULFAMETHOXAZOLE-TRIMETHOPRIM 800-160 MG PO TABS: 1.0000 | ORAL_TABLET | Freq: Two times a day (BID) | ORAL | 0 refills | Status: DC

## 2019-07-26 ENCOUNTER — Encounter (INDEPENDENT_AMBULATORY_CARE_PROVIDER_SITE_OTHER): Payer: Self-pay | Admitting: Nurse Practitioner

## 2019-07-26 ENCOUNTER — Telehealth (INDEPENDENT_AMBULATORY_CARE_PROVIDER_SITE_OTHER): Payer: Self-pay

## 2019-07-26 NOTE — Progress Notes (Addendum)
SUBJECTIVE:  Patient ID: [name] [name], male    DOB: 05-31-1961, 59 y.o.   MRN: [id number] Chief Complaint  Patient presents with  . Follow-up    1 Mo Post BKA     HPI  [name] [name] is a 59 y.o. male presents today for follow-up regarding his left lower extremity wounds.  Today the venous ulceration on the left lower extremity is healed after Unna wraps, however the left medial heel has significantly deteriorated.  Previously the wound had little drainage and appeared to be getting smaller.  Today the wound has copious thick, foul-smelling drainage.  Patient admits that he has not gotten the x-ray before seeing his podiatrist as requested.  He denies any fever, chills, nausea, vomiting or any other signs of systemic infection.  The patient also has a new small scratch-like ulceration on his fifth toe.  The patient had noninvasive studies done today.  Patient has triphasic waveforms down to the level of the proximal SFA although this does show a 30 to 49% stenosis.  The mid SFA has biphasic waveforms and from the distal SFA down to the tibial arteries the patient has monophasic waveforms.  There is also a 30 to 49% stenosis at the distal SFA.  There is also a hypoechoic mass noted in the left groin, noted to be a possible lymph node.  It measures 3.41 cm x 1.05 cm x 2.8 cm.  Past Medical History:  Diagnosis Date  . Arthritis   . Asthma   . Atherosclerosis   . BPH (benign prostatic hyperplasia)   . Carotid arterial disease ([place])   . Charcot's joint of foot, right   . Chronic combined systolic (congestive) and diastolic (congestive) heart failure ([place])    a. 02/2013 EF 50% by LV gram; b. 04/2017 Echo: EF 25-30%. diff HK. Gr2 DD. Mod dil LA/RV. PASP 85mmHg.  [name] COPD (chronic obstructive pulmonary disease) ([place])   . Coronary artery disease    a. 2013 S/P PCI of LAD [facility]);  b. 03/2013 PCI: RCA 90p ( 2.5 x 23 mm DES); c. 02/2014 Cath: patent RCA stent->Med Rx; d. 04/2017 Cath: LM nl, LAd  30ost/p, 70m/d, D1 80ost, LCX 90p/m, OM1 85, RCA 70/20p, 38m, 70d->Referred for CT Surg-felt to be poor candidate.  . Diabetic neuropathy ([place])   . Diabetic ulcer of right foot ([place])   . Hernia   . Hyperlipidemia   . Hypertension   . Ischemic cardiomyopathy    a. 04/2017 Echo: EF 25-30%; b. 08/2017 Cardiac MRI ([facility]): EF 19%, sev glob HK. RVEF 20%, mod BAE, triv MR, mild-mod TR. Basal lateral subendocardial infarct (viable), inf/infsept, dist septal ischemia, basal to mid lat peri-infarct ischemia.  . Morbid obesity ([place])   . Neuropathy   . PAD (peripheral artery disease) ([place])    a. Followed by Dr. Lucky Cowboy; b. 08/2010 Periph Angio: RSFA 70-80p (6X60 self-expanding stent); c. 09/2010 Periph Angio: L SFA 100p (7x unknown length self-expanding stent); d. 06/2015 Periph Angio: R SFA short segment occlusion (6x12 self-expanding stent).  . Restless leg syndrome   . Sleep apnea   . Subclavian artery stenosis, right ([place])   . Syncope and collapse   . Tobacco use    a. 75+ yr hx - still smoking 1ppd, down from 2 ppd.  . Type II diabetes mellitus ([place])   . Varicose vein     Past Surgical History:  Procedure Laterality Date  . ABDOMINAL AORTAGRAM N/A 06/23/2013   Procedure: ABDOMINAL AORTAGRAM;  Surgeon: Wellington Hampshire, MD;  Location: [facility] CATH LAB;  Service: Cardiovascular;  Laterality: N/A;  . AMPUTATION Right 04/01/2018   Procedure: AMPUTATION BELOW KNEE;  Surgeon: Algernon Huxley, MD;  Location: [facility] ORS;  Service: Vascular;  Laterality: Right;  . APPENDECTOMY    . CARDIAC CATHETERIZATION  10/14   [facility]; X1 STENT PROXIMAL RCA  . CARDIAC CATHETERIZATION  02/2010   [facility]  . CARDIAC CATHETERIZATION  02/21/2014   [facility]  . CLAVICLE SURGERY    . HERNIA REPAIR    . IRRIGATION AND DEBRIDEMENT FOOT Right 01/04/2018   Procedure: IRRIGATION AND DEBRIDEMENT FOOT;  Surgeon: Samara Deist, DPM;  Location: [facility] ORS;  Service: Podiatry;  Laterality: Right;  . LEFT HEART CATH AND CORS/GRAFTS ANGIOGRAPHY N/A  04/28/2017   Procedure: LEFT HEART CATH AND CORONARY ANGIOGRAPHY;  Surgeon: Wellington Hampshire, MD;  Location: [place] CV LAB;  Service: Cardiovascular;  Laterality: N/A;  . LOWER EXTREMITY ANGIOGRAPHY Right 03/03/2018   Procedure: LOWER EXTREMITY ANGIOGRAPHY;  Surgeon: Katha Cabal, MD;  Location: [place] CV LAB;  Service: Cardiovascular;  Laterality: Right;  . LOWER EXTREMITY ANGIOGRAPHY Left 08/24/2018   Procedure: LOWER EXTREMITY ANGIOGRAPHY;  Surgeon: Algernon Huxley, MD;  Location: [place] CV LAB;  Service: Cardiovascular;  Laterality: Left;  . LOWER EXTREMITY ANGIOGRAPHY Left 06/14/2019   Procedure: LOWER EXTREMITY ANGIOGRAPHY;  Surgeon: Algernon Huxley, MD;  Location: [place] CV LAB;  Service: Cardiovascular;  Laterality: Left;  . PERIPHERAL ARTERIAL STENT GRAFT     x2 left/right  . PERIPHERAL VASCULAR BALLOON ANGIOPLASTY Right 01/06/2018   Procedure: PERIPHERAL VASCULAR BALLOON ANGIOPLASTY;  Surgeon: Katha Cabal, MD;  Location: [place] CV LAB;  Service: Cardiovascular;  Laterality: Right;  . PERIPHERAL VASCULAR CATHETERIZATION N/A 07/06/2015   Procedure: Abdominal Aortogram w/Lower Extremity;  Surgeon: Algernon Huxley, MD;  Location: [place] CV LAB;  Service: Cardiovascular;  Laterality: N/A;  . PERIPHERAL VASCULAR CATHETERIZATION  07/06/2015   Procedure: Lower Extremity Intervention;  Surgeon: Algernon Huxley, MD;  Location: [place] CV LAB;  Service: Cardiovascular;;    Social History   Socioeconomic History  . Marital status: Single    Spouse name: Not on file  . Number of children: 0  . Years of education: 27  . Highest education level: 12th grade  Occupational History  . Occupation: on disability  Tobacco Use  . Smoking status: Current Every Day Smoker    Packs/day: 1.00    Years: 41.00    Pack years: 41.00    Types: Cigarettes  . Smokeless tobacco: Never Used  Substance and Sexual Activity  . Alcohol use: No  . Drug use: No  .  Sexual activity: Yes  Other Topics Concern  . Not on file  Social History Narrative   Lives at home in [place] with girlfriend. Independent at baseline.   Social Determinants of Health   Financial Resource Strain: Low Risk   . Difficulty of Paying Living Expenses: Not hard at all  Food Insecurity: No Food Insecurity  . Worried About Charity fundraiser in the Last Year: Never true  . Ran Out of Food in the Last Year: Never true  Transportation Needs:   . Lack of Transportation (Medical): Not on file  . Lack of Transportation (Non-Medical): Not on file  Physical Activity:   . Days of Exercise per Week: Not on file  . Minutes of Exercise per Session: Not on file  Stress: Stress Concern Present  .  Feeling of Stress : Rather much  Social Connections:   . Frequency of Communication with Friends and Family: Not on file  . Frequency of Social Gatherings with Friends and Family: Not on file  . Attends Religious Services: Not on file  . Active Member of Clubs or Organizations: Not on file  . Attends Archivist Meetings: Not on file  . Marital Status: Not on file  Intimate Partner Violence:   . Fear of Current or Ex-Partner: Not on file  . Emotionally Abused: Not on file  . Physically Abused: Not on file  . Sexually Abused: Not on file    Family History  Problem Relation Age of Onset  . Heart attack Father   . Heart disease Father   . Hypertension Father   . Hyperlipidemia Father   . Hypertension Mother   . Hyperlipidemia Mother     Allergies  Allergen Reactions  . Dulaglutide Anaphylaxis, Diarrhea and Hives  . Other Itching    Skin itching associated with nitro patch  . Prednisone Rash     Review of Systems   Review of Systems: Negative Unless Checked Constitutional: [] Weight loss  [] Fever  [] Chills Cardiac: [] Chest pain   []  Atrial Fibrillation  [] Palpitations   [] Shortness of breath when laying flat   [] Shortness of breath with exertion. [] Shortness of  breath at rest Vascular:  [] Pain in legs with walking   [] Pain in legs with standing [] Pain in legs when laying flat   [] Claudication    [] Pain in feet when laying flat    [] History of DVT   [] Phlebitis   [] Swelling in legs   [] Varicose veins   [x] Non-healing ulcers Pulmonary:   [] Uses home oxygen   [] Productive cough   [] Hemoptysis   [] Wheeze  [x] COPD   [] Asthma Neurologic:  [] Dizziness   [] Seizures  [] Blackouts [] History of stroke   [] History of TIA  [] Aphasia   [] Temporary Blindness   [] Weakness or numbness in arm   [] Weakness or numbness in leg Musculoskeletal:   [] Joint swelling   [] Joint pain   [] Low back pain  []  History of Knee Replacement [x] Arthritis [] back Surgeries  []  Spinal Stenosis    Hematologic:  [] Easy bruising  [] Easy bleeding   [] Hypercoagulable state   [] Anemic Gastrointestinal:  [] Diarrhea   [] Vomiting  [] Gastroesophageal reflux/heartburn   [] Difficulty swallowing. [] Abdominal pain Genitourinary:  [] Chronic kidney disease   [] Difficult urination  [] Anuric   [] Blood in urine [] Frequent urination  [] Burning with urination   [] Hematuria Skin:  [] Rashes   [x] Ulcers [] Wounds Psychological:  [] History of anxiety   []  History of major depression  []  Memory Difficulties      OBJECTIVE:   Physical Exam  BP 128/83 (BP Location: Right Arm)   Pulse (!) 101   Resp 12   Ht 5\' 7"  (1.702 m)   Wt 209 lb (94.8 kg)   BMI 32.73 kg/m   Gen: WD/WN, NAD Head: [place]/AT, No temporalis wasting.  Ear/Nose/Throat: Hearing grossly intact, nares w/o erythema or drainage Eyes: PER, EOMI, sclera nonicteric.  Neck: Supple, no masses.  No JVD.  Pulmonary:  Good air movement, no use of accessory muscles.  Cardiac: RRR Vascular:  Quarter full-thickness ulceration with foul-smelling, thick yellow drainage.  Small scratch-like ulceration on left fifth toe. Vessel Right Left  Radial Palpable Palpable  Dorsalis Pedis  Not Palpable  Posterior Tibial  Not Palpable   Gastrointestinal: soft,  non-distended. No guarding/no peritoneal signs.  Musculoskeletal: M/S 5/5 throughout.  Right below-knee amputation  Neurologic: Pain and light touch intact in extremities.  Symmetrical.  Speech is fluent. Motor exam as listed above. Psychiatric: Judgment intact, Mood & affect appropriate for pt's clinical situation. Dermatologic: No Venous rashes. No Ulcers Noted.  No changes consistent with cellulitis. Lymph : No Cervical lymphadenopathy, no lichenification or skin changes of chronic lymphedema.       ASSESSMENT AND PLAN:  1. Diabetic foot infection ([place]) Previously Bactrim well for the patient's wound.  We have prescribed another 2-week course of this.  I have reminded the patient to go and get an x-ray to ensure that osteomyelitis is not sent to the wound and he will not need a longer course of antibiotics.  He was also given the phone number to the scheduling center.  The patient still has not followed up with his podiatrist as requested as well.  Reminded him to contact the podiatrist office as well in addition to the previous referral that we have placed in addition.  We will also obtain home health for the patient in order to have an extra set of eyes in case the wound begins to worsen or if a different set of intervention is necessary.  Patient is in agreement to plan.  - sulfamethoxazole-trimethoprim (BACTRIM DS) 800-160 MG tablet; Take 1 tablet by mouth 2 (two) times daily.  Dispense: 28 tablet; Refill: 0  2. Atherosclerosis of native arteries of the extremities with ulceration ([place])  Recommend:  The patient has evidence of severe atherosclerotic changes of the left lower extremities associated with ulceration and tissue loss of the foot.  This represents a limb threatening ischemia and places the patient at the risk for limb loss.  Patient should undergo angiography of the lower extremities with the hope for intervention for limb salvage.  The risks and benefits as well as the  alternative therapies was discussed in detail with the patient.  All questions were answered.  Patient agrees to proceed with angiography.  The patient will follow up with me in the office after the procedure.    3. Essential hypertension Continue antihypertensive medications as already ordered, these medications have been reviewed and there are no changes at this time.    Current Outpatient Medications on File Prior to Visit  Medication Sig Dispense Refill  . albuterol (VENTOLIN HFA) 108 (90 Base) MCG/ACT inhaler Inhale into the lungs every 6 (six) hours as needed for wheezing or shortness of breath.    [name] aspirin EC 81 MG tablet Take 1 tablet (81 mg total) by mouth daily. 90 tablet 3  . atorvastatin (LIPITOR) 80 MG tablet Take 80 mg by mouth daily.    . calcium carbonate (TUMS - DOSED IN MG ELEMENTAL CALCIUM) 500 MG chewable tablet Chew 4 tablets by mouth daily as needed for indigestion or heartburn.    . clindamycin (CLEOCIN) 300 MG capsule Take by mouth.    . clopidogrel (PLAVIX) 75 MG tablet Take 75 mg by mouth daily.     . diphenhydrAMINE (BENADRYL) 25 MG tablet Take 25 mg by mouth daily as needed for allergies.    [name] ENTRESTO 24-26 MG TAKE 1 TABLET BY MOUTH TWICE DAILY 60 tablet 3  . ezetimibe (ZETIA) 10 MG tablet Take 1 tablet (10 mg total) by mouth daily. 30 tablet 5  . Insulin Glargine (LANTUS SOLOSTAR) 100 UNIT/ML Solostar Pen Inject 30 Units into the skin at bedtime. 15 mL 11  . insulin lispro (HUMALOG) 100 UNIT/ML injection Sliding Scale Insulin before meals using Novolog If BS  is 0 to 70 no insulin and eat/glucose tablets/glucose get If BS 71 to 150 great! If BS 151 to 200, 2 Units Novolog If BS 201 to 250, 4 Units Novolog If BS 251 to 300, 6 Units Novolog If BS 301 to 350, 8 Units Novolog If BS 351 to 400, 10 Units Novolog If BS greater than 400, take 10 Units and Contact our office during office hour    . ipratropium-albuterol (DUONEB) 0.5-2.5 (3) MG/3ML SOLN Inhale 3 mLs into  the lungs every 6 (six) hours as needed (wheezing/sob).     . isosorbide mononitrate (IMDUR) 30 MG 24 hr tablet Take 1 tablet (30 mg total) by mouth daily. 30 tablet 5  . metFORMIN (GLUCOPHAGE-XR) 500 MG 24 hr tablet Take 1,000 mg by mouth 2 (two) times daily.     . metoprolol succinate (TOPROL-XL) 25 MG 24 hr tablet Take 1 tablet (25 mg total) by mouth daily. 15 tablet 5  . Multiple Vitamin (MULTI-VITAMINS) TABS Take 1 tablet by mouth daily.     . multivitamin-lutein (OCUVITE-LUTEIN) CAPS capsule Take 1 capsule by mouth daily. 30 capsule 0  . naproxen (NAPROSYN) 500 MG tablet Take 1 tablet (500 mg total) by mouth 2 (two) times daily with a meal. 20 tablet 2  . nitroGLYCERIN (NITRODUR - DOSED IN MG/24 HR) 0.4 mg/hr patch Place 0.4 mg onto the skin daily.    . nitroGLYCERIN (NITROSTAT) 0.4 MG SL tablet Place 1 tablet (0.4 mg total) under the tongue every 5 (five) minutes as needed for chest pain. 30 tablet 5  . Oxycodone HCl 10 MG TABS Take 10 mg by mouth every 6 (six) hours as needed (pain).     . potassium chloride (KLOR-CON) 10 MEQ tablet Take 1 tablet (10 mEq total) by mouth daily. (Patient taking differently: Take 20 mEq by mouth daily. 72meq AM/ 37meq PM) 30 tablet 5  . promethazine (PHENERGAN) 25 MG tablet TAKE 1 TABLET BY MOUTH EVERY 6 HOURS AS NEEDED NAUSEA    . spironolactone (ALDACTONE) 25 MG tablet Take 25 mg by mouth daily.     [name] torsemide (DEMADEX) 20 MG tablet Take 40 mg by mouth daily. 40mg  AM/ 20mg  PM    . traZODone (DESYREL) 50 MG tablet Take 150 mg by mouth at bedtime as needed for sleep.     [name] gabapentin (NEURONTIN) 800 MG tablet Take 800 mg by mouth 4 (four) times daily.     . traZODone (DESYREL) 150 MG tablet Take 150 mg by mouth at bedtime.     No current facility-administered medications on file prior to visit.    There are no Patient Instructions on file for this visit. No follow-ups on file.   Kris Hartmann, NP  This note was completed with Sales executive.  Any  errors are purely unintentional.

## 2019-07-26 NOTE — Telephone Encounter (Signed)
I called earlier and left a message for the patient and he returned my call. Patient is now scheduled with Dr. Lucky Cowboy for LLE angio on 08/02/19 with a 9:00 am arrival time to the [facility]. Patient will do covid testing on 07/29/19 between 12:30-2:30 pm at the [place]. Pre-procedure instructions were discussed and will be mailed to the patient.

## 2019-07-27 NOTE — Telephone Encounter (Signed)
Patient's information was sent to [facility] for wound care.

## 2019-07-29 ENCOUNTER — Telehealth (INDEPENDENT_AMBULATORY_CARE_PROVIDER_SITE_OTHER): Payer: Self-pay

## 2019-07-29 ENCOUNTER — Other Ambulatory Visit
Admission: RE | Admit: 2019-07-29 | Discharge: 2019-07-29 | Disposition: A | Discharge status: Home / Self Care | Payer: Medicare Other | Source: Ambulatory Visit | Attending: Vascular Surgery | Admitting: Vascular Surgery

## 2019-07-29 ENCOUNTER — Ambulatory Visit: Payer: Medicare Other | Admitting: Family

## 2019-07-29 DIAGNOSIS — Z20822 Contact with and (suspected) exposure to covid-19: Secondary | ICD-10-CM | POA: Insufficient documentation

## 2019-07-29 DIAGNOSIS — Z01812 Encounter for preprocedural laboratory examination: Secondary | ICD-10-CM | POA: Insufficient documentation

## 2019-07-29 LAB — SARS CORONAVIRUS 2 (TAT 6-24 HRS): SARS Coronavirus 2: NEGATIVE

## 2019-07-29 NOTE — Telephone Encounter (Signed)
[facility] will take the patient, orders will be sent to them as soon as possible.

## 2019-07-29 NOTE — Telephone Encounter (Signed)
Patient called and left a message about whether or not he should get his covid testing. I checked the appts and it was still showing his covid test at 12:30pm. I called the patient and let him know that the appt is still showing but I did advise that he should go now to get his test today. I also spoke with the patient regarding getting home health to him for his wound care. I have contacted 2 homehealth agencies, [facility] which doesn't take anything but [facility] this week and [facility] that does not have the staffing at this time. I have made contact with [facility] and am waiting on a response.

## 2019-07-30 ENCOUNTER — Other Ambulatory Visit: Payer: Self-pay

## 2019-07-30 ENCOUNTER — Ambulatory Visit: Payer: Medicare Other | Attending: Family | Admitting: Family

## 2019-07-30 ENCOUNTER — Encounter: Payer: Self-pay | Admitting: Family

## 2019-07-30 ENCOUNTER — Telehealth: Payer: Self-pay | Admitting: Family

## 2019-07-30 DIAGNOSIS — Z720 Tobacco use: Secondary | ICD-10-CM

## 2019-07-30 DIAGNOSIS — I739 Peripheral vascular disease, unspecified: Secondary | ICD-10-CM

## 2019-07-30 DIAGNOSIS — I5022 Chronic systolic (congestive) heart failure: Secondary | ICD-10-CM

## 2019-07-30 DIAGNOSIS — I10 Essential (primary) hypertension: Secondary | ICD-10-CM

## 2019-07-30 NOTE — Telephone Encounter (Signed)
Virtual Visit Pre-Appointment Phone Call  "[name] [name], I am calling you today to discuss your upcoming appointment. We are currently trying to limit exposure to the virus that causes COVID-19 by seeing patients at home rather than in the office."  1. "What is the BEST phone number to call the day of the visit?" - include this in appointment notes  2. "Do you have or have access to (through a family member/friend) a smartphone with video capability that we can use for your visit?" a. If yes - list this number in appt notes as "cell" (if different from BEST phone #) and list the appointment type as a VIDEO visit in appointment notes b. If no - list the appointment type as a PHONE visit in appointment notes  3. Confirm consent - "In the setting of the current Covid19 crisis, you are scheduled for a (phone or video) visit with your provider on (date) at (time).  Just as we do with many in-office visits, in order for you to participate in this visit, we must obtain consent.  If you'd like, I can send this to your mychart (if signed up) or email for you to review.  Otherwise, I can obtain your verbal consent now.  All virtual visits are billed to your insurance company just like a normal visit would be.  By agreeing to a virtual visit, we'd like you to understand that the technology does not allow for your provider to perform an examination, and thus may limit your provider's ability to fully assess your condition. If your provider identifies any concerns that need to be evaluated in person, we will make arrangements to do so.  Finally, though the technology is pretty good, we cannot assure that it will always work on either your or our end, and in the setting of a video visit, we may have to convert it to a phone-only visit.  In either situation, we cannot ensure that we have a secure connection.  Are you willing to proceed?" STAFF: Did the patient verbally acknowledge consent to telehealth visit? Document  YES/NO here: YES  4. Advise patient to be prepared - "Two hours prior to your appointment, go ahead and check your blood pressure, pulse, oxygen saturation, and your weight (if you have the equipment to check those) and write them all down. When your visit starts, your provider will ask you for this information. If you have an Apple Watch or Kardia device, please plan to have heart rate information ready on the day of your appointment. Please have a pen and paper handy nearby the day of the visit as well."  5. Give patient instructions for MyChart download to smartphone OR Doximity/Doxy.me as below if video visit (depending on what platform provider is using)  6. Inform patient they will receive a phone call 15 minutes prior to their appointment time (may be from unknown caller ID) so they should be prepared to answer    [name] [name] has been deemed a candidate for a follow-up tele-health visit to limit community exposure during the Covid-19 pandemic. I spoke with the patient via phone to ensure availability of phone/video source, confirm preferred email & phone number, and discuss instructions and expectations.  I reminded [name] [name] to be prepared with any vital sign and/or heart rhythm information that could potentially be obtained via home monitoring, at the time of his visit. I reminded [name] [name] to expect a phone call prior to  his visit.  [name] [name], [name] 07/30/2019 10:58 AM   INSTRUCTIONS FOR DOWNLOADING THE MYCHART APP TO SMARTPHONE  - The patient must first make sure to have activated MyChart and know their login information - If Apple, go to [employer] and type in MyChart in the search bar and download the app. If Android, ask patient to go to [employer] and type in [place] in the search bar and download the app. The app is free but as with any other app downloads, their phone may require them to verify saved payment information or Apple/Android  password.  - The patient will need to then log into the app with their MyChart username and password, and select [place] as their healthcare provider to link the account. When it is time for your visit, go to the MyChart app, find appointments, and click Begin Video Visit. Be sure to Select Allow for your device to access the Microphone and Camera for your visit. You will then be connected, and your provider will be with you shortly.  **If they have any issues connecting, or need assistance please contact MyChart service desk [phone])**  **If using a computer, in order to ensure the best quality for their visit they will need to use either of the following Internet Browsers: [employer], or Google Chrome**  IF USING DOXIMITY or DOXY.ME - The patient will receive a link just prior to their visit by text.     FULL LENGTH CONSENT FOR TELE-HEALTH VISIT   I hereby voluntarily request, consent and authorize [facility] and its employed or contracted physicians, physician assistants, nurse practitioners or other licensed health care professionals (the Practitioner), to provide me with telemedicine health care services (the "Services") as deemed necessary by the treating Practitioner. I acknowledge and consent to receive the Services by the Practitioner via telemedicine. I understand that the telemedicine visit will involve communicating with the Practitioner through live audiovisual communication technology and the disclosure of certain medical information by electronic transmission. I acknowledge that I have been given the opportunity to request an in-person assessment or other available alternative prior to the telemedicine visit and am voluntarily participating in the telemedicine visit.  I understand that I have the right to withhold or withdraw my consent to the use of telemedicine in the course of my care at any time, without affecting my right to future care or treatment, and  that the Practitioner or I may terminate the telemedicine visit at any time. I understand that I have the right to inspect all information obtained and/or recorded in the course of the telemedicine visit and may receive copies of available information for a reasonable fee.  I understand that some of the potential risks of receiving the Services via telemedicine include:  [name] Delay or interruption in medical evaluation due to technological equipment failure or disruption; . Information transmitted may not be sufficient (e.g. poor resolution of images) to allow for appropriate medical decision making by the Practitioner; and/or  . In rare instances, security protocols could fail, causing a breach of personal health information.  Furthermore, I acknowledge that it is my responsibility to provide information about my medical history, conditions and care that is complete and accurate to the best of my ability. I acknowledge that Practitioner's advice, recommendations, and/or decision may be based on factors not within their control, such as incomplete or inaccurate data provided by me or distortions of diagnostic images or specimens that may result from electronic transmissions.  I understand that the practice of medicine is not an exact science and that Practitioner makes no warranties or guarantees regarding treatment outcomes. I acknowledge that I will receive a copy of this consent concurrently upon execution via email to the email address I last provided but may also request a printed copy by calling the office of [facility].    I understand that my insurance will be billed for this visit.   I have read or had this consent read to me. . I understand the contents of this consent, which adequately explains the benefits and risks of the Services being provided via telemedicine.  . I have been provided ample opportunity to ask questions regarding this consent and the Services and have had my  questions answered to my satisfaction. . I give my informed consent for the services to be provided through the use of telemedicine in my medical care  By participating in this telemedicine visit I agree to the above.

## 2019-07-30 NOTE — Progress Notes (Signed)
Virtual Visit via Telephone Note   Evaluation Performed:  Follow-up visit  This visit type was conducted due to national recommendations for restrictions regarding the COVID-19 Pandemic (e.g. social distancing).  This format is felt to be most appropriate for this patient at this time.  All issues noted in this document were discussed and addressed.  No physical exam was performed (except for noted visual exam findings with Video Visits).  Please refer to the patient's chart (MyChart message for video visits and phone note for telephone visits) for the patient's consent to telehealth for [facility]  Date:  07/30/2019   ID:  [name] [name], DOB [birth year], MRN [id number]  Patient Location:  [place] [address]   Provider location:   [facility] [address], [place] [address]  PCP:  Scot [place], [facility]  Cardiologist:  Ida Rogue, MD  Electrophysiologist:  None   Chief Complaint:  Shortness of breath  History of Present Illness:    [name] [name] is a 59 y.o. male who presents via audio/video conferencing for a telehealth visit today.  Patient verified DOB and address.  The patient does not have symptoms concerning for COVID-19 infection (fever, chills, cough, or new SHORTNESS OF BREATH). Patient reports mild shortness of breath upon minimal exertion. He describes this as chronic in nature having been present for several years although does report that his breathing is better "lately". He has associated light-headedness, left leg pain, intermittent chest pain, cough and occasional wheezing along with this. He denies any swelling in his leg/ abdomen, fatigue or difficulty sleeping.   He hasn't been weighing himself lately because he says that his left leg is hurting him so much that he doesn't feel like doing anything.    Prior CV studies:   The following studies were reviewed today:  Echo report from  11/06/2018 reviewed and showed an EF of 20-25%  Past Medical History:  Diagnosis Date  . Arthritis   . Asthma   . Atherosclerosis   . BPH (benign prostatic hyperplasia)   . Carotid arterial disease ([place])   . Charcot's joint of foot, right   . Chronic combined systolic (congestive) and diastolic (congestive) heart failure ([place])    a. 02/2013 EF 50% by LV gram; b. 04/2017 Echo: EF 25-30%. diff HK. Gr2 DD. Mod dil LA/RV. PASP 44mmHg.  [name] COPD (chronic obstructive pulmonary disease) ([place])   . Coronary artery disease    a. 2013 S/P PCI of LAD [facility]);  b. 03/2013 PCI: RCA 90p ( 2.5 x 23 mm DES); c. 02/2014 Cath: patent RCA stent->Med Rx; d. 04/2017 Cath: LM nl, LAd 30ost/p, 57m/d, D1 80ost, LCX 90p/m, OM1 85, RCA 70/20p, 56m, 70d->Referred for CT Surg-felt to be poor candidate.  . Diabetic neuropathy ([place])   . Diabetic ulcer of right foot ([place])   . Hernia   . Hyperlipidemia   . Hypertension   . Ischemic cardiomyopathy    a. 04/2017 Echo: EF 25-30%; b. 08/2017 Cardiac MRI ([facility]): EF 19%, sev glob HK. RVEF 20%, mod BAE, triv MR, mild-mod TR. Basal lateral subendocardial infarct (viable), inf/infsept, dist septal ischemia, basal to mid lat peri-infarct ischemia.  . Morbid obesity ([place])   . Neuropathy   . PAD (peripheral artery disease) ([place])    a. Followed by Dr. Lucky Cowboy; b. 08/2010 Periph Angio: RSFA 70-80p (6X60 self-expanding stent); c. 09/2010 Periph Angio: L SFA 100p (7x unknown length self-expanding stent); d.  06/2015 Periph Angio: R SFA short segment occlusion (6x12 self-expanding stent).  . Restless leg syndrome   . Sleep apnea   . Subclavian artery stenosis, right ([place])   . Syncope and collapse   . Tobacco use    a. 75+ yr hx - still smoking 1ppd, down from 2 ppd.  . Type II diabetes mellitus ([place])   . Varicose vein    Past Surgical History:  Procedure Laterality Date  . ABDOMINAL AORTAGRAM N/A 06/23/2013   Procedure: ABDOMINAL Maxcine Ham;  Surgeon: Wellington Hampshire, MD;  Location: [facility] CATH  [facility];  Service: Cardiovascular;  Laterality: N/A;  . AMPUTATION Right 04/01/2018   Procedure: AMPUTATION BELOW KNEE;  Surgeon: Algernon Huxley, MD;  Location: [facility] ORS;  Service: Vascular;  Laterality: Right;  . APPENDECTOMY    . CARDIAC CATHETERIZATION  10/14   [facility]; X1 STENT PROXIMAL RCA  . CARDIAC CATHETERIZATION  02/2010   [facility]  . CARDIAC CATHETERIZATION  02/21/2014   [facility]  . CLAVICLE SURGERY    . HERNIA REPAIR    . IRRIGATION AND DEBRIDEMENT FOOT Right 01/04/2018   Procedure: IRRIGATION AND DEBRIDEMENT FOOT;  Surgeon: Samara Deist, DPM;  Location: [facility] ORS;  Service: Podiatry;  Laterality: Right;  . LEFT HEART CATH AND CORS/GRAFTS ANGIOGRAPHY N/A 04/28/2017   Procedure: LEFT HEART CATH AND CORONARY ANGIOGRAPHY;  Surgeon: Wellington Hampshire, MD;  Location: [facility] CV LAB;  Service: Cardiovascular;  Laterality: N/A;  . LOWER EXTREMITY ANGIOGRAPHY Right 03/03/2018   Procedure: LOWER EXTREMITY ANGIOGRAPHY;  Surgeon: Katha Cabal, MD;  Location: [facility] CV LAB;  Service: Cardiovascular;  Laterality: Right;  . LOWER EXTREMITY ANGIOGRAPHY Left 08/24/2018   Procedure: LOWER EXTREMITY ANGIOGRAPHY;  Surgeon: Algernon Huxley, MD;  Location: [facility] CV LAB;  Service: Cardiovascular;  Laterality: Left;  . LOWER EXTREMITY ANGIOGRAPHY Left 06/14/2019   Procedure: LOWER EXTREMITY ANGIOGRAPHY;  Surgeon: Algernon Huxley, MD;  Location: [facility] CV LAB;  Service: Cardiovascular;  Laterality: Left;  . PERIPHERAL ARTERIAL STENT GRAFT     x2 left/right  . PERIPHERAL VASCULAR BALLOON ANGIOPLASTY Right 01/06/2018   Procedure: PERIPHERAL VASCULAR BALLOON ANGIOPLASTY;  Surgeon: Katha Cabal, MD;  Location: [facility] CV LAB;  Service: Cardiovascular;  Laterality: Right;  . PERIPHERAL VASCULAR CATHETERIZATION N/A 07/06/2015   Procedure: Abdominal Aortogram w/Lower Extremity;  Surgeon: Algernon Huxley, MD;  Location: [facility] CV LAB;  Service: Cardiovascular;  Laterality: N/A;  . PERIPHERAL  VASCULAR CATHETERIZATION  07/06/2015   Procedure: Lower Extremity Intervention;  Surgeon: Algernon Huxley, MD;  Location: [facility] CV LAB;  Service: Cardiovascular;;     Current Meds  Medication Sig  . albuterol (VENTOLIN HFA) 108 (90 Base) MCG/ACT inhaler Inhale into the lungs every 6 (six) hours as needed for wheezing or shortness of breath.  [name] aspirin EC 81 MG tablet Take 1 tablet (81 mg total) by mouth daily.  [name] atorvastatin (LIPITOR) 80 MG tablet Take 80 mg by mouth daily.  . calcium carbonate (TUMS - DOSED IN MG ELEMENTAL CALCIUM) 500 MG chewable tablet Chew 4 tablets by mouth daily as needed for indigestion or heartburn.  . clindamycin (CLEOCIN) 300 MG capsule Take by mouth.  . clopidogrel (PLAVIX) 75 MG tablet Take 75 mg by mouth daily.   . diphenhydrAMINE (BENADRYL) 25 MG tablet Take 25 mg by mouth daily as needed for allergies.  [name] ENTRESTO 24-26 MG TAKE 1 TABLET BY MOUTH TWICE DAILY  . ezetimibe (ZETIA) 10 MG tablet Take 1  tablet (10 mg total) by mouth daily.  [name] gabapentin (NEURONTIN) 800 MG tablet Take 800 mg by mouth 4 (four) times daily.   . Insulin Glargine (LANTUS SOLOSTAR) 100 UNIT/ML Solostar Pen Inject 30 Units into the skin at bedtime.  . insulin lispro (HUMALOG) 100 UNIT/ML injection Sliding Scale Insulin before meals using Novolog If BS is 0 to 70 no insulin and eat/glucose tablets/glucose get If BS 71 to 150 great! If BS 151 to 200, 2 Units Novolog If BS 201 to 250, 4 Units Novolog If BS 251 to 300, 6 Units Novolog If BS 301 to 350, 8 Units Novolog If BS 351 to 400, 10 Units Novolog If BS greater than 400, take 10 Units and Contact our office during office hour  . ipratropium-albuterol (DUONEB) 0.5-2.5 (3) MG/3ML SOLN Inhale 3 mLs into the lungs every 6 (six) hours as needed (wheezing/sob).   . isosorbide mononitrate (IMDUR) 30 MG 24 hr tablet Take 1 tablet (30 mg total) by mouth daily.  . metFORMIN (GLUCOPHAGE-XR) 500 MG 24 hr tablet Take 1,000 mg by mouth 2 (two) times  daily.   . metoprolol succinate (TOPROL-XL) 25 MG 24 hr tablet Take 1 tablet (25 mg total) by mouth daily.  . Multiple Vitamin (MULTI-VITAMINS) TABS Take 1 tablet by mouth daily.   . multivitamin-lutein (OCUVITE-LUTEIN) CAPS capsule Take 1 capsule by mouth daily.  . naproxen (NAPROSYN) 500 MG tablet Take 1 tablet (500 mg total) by mouth 2 (two) times daily with a meal.  . nitroGLYCERIN (NITRODUR - DOSED IN MG/24 HR) 0.4 mg/hr patch Place 0.4 mg onto the skin daily.  . nitroGLYCERIN (NITROSTAT) 0.4 MG SL tablet Place 1 tablet (0.4 mg total) under the tongue every 5 (five) minutes as needed for chest pain.  . Oxycodone HCl 10 MG TABS Take 10 mg by mouth every 6 (six) hours as needed (pain).   . potassium chloride (KLOR-CON) 10 MEQ tablet Take 1 tablet (10 mEq total) by mouth daily. (Patient taking differently: Take 20 mEq by mouth daily. 76meq AM/ 58meq PM)  . promethazine (PHENERGAN) 25 MG tablet TAKE 1 TABLET BY MOUTH EVERY 6 HOURS AS NEEDED NAUSEA  . spironolactone (ALDACTONE) 25 MG tablet Take 25 mg by mouth daily.   [name] sulfamethoxazole-trimethoprim (BACTRIM DS) 800-160 MG tablet Take 1 tablet by mouth 2 (two) times daily.  [name] torsemide (DEMADEX) 20 MG tablet Take 40 mg by mouth daily. 40mg  AM/ 20mg  PM  . traZODone (DESYREL) 150 MG tablet Take 150 mg by mouth at bedtime.  . traZODone (DESYREL) 50 MG tablet Take 150 mg by mouth at bedtime as needed for sleep.      Allergies:   Dulaglutide, Other, and Prednisone   Social History   Tobacco Use  . Smoking status: Current Every Day Smoker    Packs/day: 1.00    Years: 41.00    Pack years: 41.00    Types: Cigarettes  . Smokeless tobacco: Never Used  Substance Use Topics  . Alcohol use: No  . Drug use: No     Family Hx: The patient's family history includes Heart attack in his father; Heart disease in his father; Hyperlipidemia in his father and mother; Hypertension in his father and mother.  ROS:   Please see the history of present  illness.     All other systems reviewed and are negative.   Labs/Other Tests and Data Reviewed:    Recent Labs: 11/06/2018: Hemoglobin 10.9; Platelets 275 06/02/2019: B Natriuretic Peptide 1,252.0 07/02/2019: BUN 10;  Creatinine, Ser 0.98; Potassium 4.9; Sodium 131   Recent Lipid Panel Lab Results  Component Value Date/Time   CHOL 80 11/09/2018 03:08 AM   CHOL 211 (H) 04/05/2013 05:20 AM   TRIG 42 11/09/2018 03:08 AM   TRIG 321 (H) 04/05/2013 05:20 AM   HDL 26 (L) 11/09/2018 03:08 AM   HDL 34 (L) 04/05/2013 05:20 AM   CHOLHDL 3.1 11/09/2018 03:08 AM   LDLCALC 46 11/09/2018 03:08 AM   LDLCALC 113 (H) 04/05/2013 05:20 AM    Wt Readings from Last 3 Encounters:  07/22/19 209 lb (94.8 kg)  07/16/19 206 lb (93.4 kg)  07/02/19 209 lb (94.8 kg)     Exam:    Vital Signs:  There were no vitals taken for this visit.   Well nourished, well developed male in no  acute distress.   ASSESSMENT & PLAN:    1. Chronic heart failure with reduced ejection fraction- - NYHA class II - euvolemic today per patient's report - not weighing daily; encouraged to resume and call for an overnight weight gain of >2 pounds or a weekly weight gain of >5 pounds - says that he is taking his entresto BID but isn't sure about everything else and doesn't have medications close by to review - not adding salt and has been using Mrs. Dash & trying to read food labels.  - drinking ~ 64 ounces of fluid daily (Mtn Dew, pepsi, water) - saw cardiology Gilford Rile) 07/02/19 - BNP 06/02/2019 was 1252.0 - says that he does not take the flu vaccine; good handwashing encouraged  2: HTN- - saw PCP Jodi Mourning) at [facility] -  primary care in [facility] 06/08/2019 - BMP done 07/02/19 reviewed and shows sodium 131, potassium 4.9, creatinine 0.98 and GFR 86  3: Tobacco use- - currently smoking 3/4-1 ppd of cigarettes  - complete cessation discussed for 3 minutes with him  4: Severe atherosclerosis of lower extremities- - left  lower leg angiogram scheduled for 08/02/19 - saw vascular Owens Shark) 07/22/19 - 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  The importance of social distancing was discussed today.  Patient Risk:   After full review of this patients clinical status, I feel that they are at least moderate risk at this time.  Time:   Today, I have spent 8 minutes with the patient with telehealth technology discussing symptoms and overview of medications.     Medication Adjustments/Labs and Tests Ordered: Current medicines are reviewed at length with the patient today.  Concerns regarding medicines are outlined above.   Tests Ordered: No orders of the defined types were placed in this encounter.  Medication Changes: No orders of the defined types were placed in this encounter.   Disposition: Follow-up in 3 months or sooner for any questions/problems before then.   Signed, Alisa Graff, FNP  07/30/2019 1:28 PM    [facility]

## 2019-07-30 NOTE — Patient Instructions (Addendum)
Resume weighing daily and call for an overnight weight gain of > 2 pounds or a weekly weight gain of >5 pounds. 

## 2019-08-01 ENCOUNTER — Other Ambulatory Visit (INDEPENDENT_AMBULATORY_CARE_PROVIDER_SITE_OTHER): Payer: Self-pay | Admitting: Nurse Practitioner

## 2019-08-02 ENCOUNTER — Encounter
Admission: RE | Disposition: A | Discharge status: Home / Self Care | Payer: Self-pay | Source: Home / Self Care | Attending: Vascular Surgery

## 2019-08-02 ENCOUNTER — Other Ambulatory Visit: Payer: Self-pay

## 2019-08-02 ENCOUNTER — Ambulatory Visit: Payer: Medicare Other | Admitting: Family

## 2019-08-02 ENCOUNTER — Encounter: Payer: Self-pay | Admitting: Vascular Surgery

## 2019-08-02 ENCOUNTER — Ambulatory Visit
Admission: RE | Admit: 2019-08-02 | Discharge: 2019-08-02 | Disposition: A | Discharge status: Home / Self Care | Payer: Medicare Other | Attending: Vascular Surgery | Admitting: Vascular Surgery

## 2019-08-02 DIAGNOSIS — I6529 Occlusion and stenosis of unspecified carotid artery: Secondary | ICD-10-CM | POA: Insufficient documentation

## 2019-08-02 DIAGNOSIS — G2581 Restless legs syndrome: Secondary | ICD-10-CM | POA: Diagnosis not present

## 2019-08-02 DIAGNOSIS — F17210 Nicotine dependence, cigarettes, uncomplicated: Secondary | ICD-10-CM | POA: Diagnosis not present

## 2019-08-02 DIAGNOSIS — Z794 Long term (current) use of insulin: Secondary | ICD-10-CM | POA: Insufficient documentation

## 2019-08-02 DIAGNOSIS — M1990 Unspecified osteoarthritis, unspecified site: Secondary | ICD-10-CM | POA: Insufficient documentation

## 2019-08-02 DIAGNOSIS — E1140 Type 2 diabetes mellitus with diabetic neuropathy, unspecified: Secondary | ICD-10-CM | POA: Insufficient documentation

## 2019-08-02 DIAGNOSIS — E785 Hyperlipidemia, unspecified: Secondary | ICD-10-CM | POA: Diagnosis not present

## 2019-08-02 DIAGNOSIS — Z7982 Long term (current) use of aspirin: Secondary | ICD-10-CM | POA: Insufficient documentation

## 2019-08-02 DIAGNOSIS — I70245 Atherosclerosis of native arteries of left leg with ulceration of other part of foot: Secondary | ICD-10-CM | POA: Diagnosis not present

## 2019-08-02 DIAGNOSIS — I2510 Atherosclerotic heart disease of native coronary artery without angina pectoris: Secondary | ICD-10-CM | POA: Diagnosis not present

## 2019-08-02 DIAGNOSIS — I110 Hypertensive heart disease with heart failure: Secondary | ICD-10-CM | POA: Insufficient documentation

## 2019-08-02 DIAGNOSIS — Z7902 Long term (current) use of antithrombotics/antiplatelets: Secondary | ICD-10-CM | POA: Insufficient documentation

## 2019-08-02 DIAGNOSIS — L97909 Non-pressure chronic ulcer of unspecified part of unspecified lower leg with unspecified severity: Secondary | ICD-10-CM

## 2019-08-02 DIAGNOSIS — I70299 Other atherosclerosis of native arteries of extremities, unspecified extremity: Secondary | ICD-10-CM

## 2019-08-02 DIAGNOSIS — I70244 Atherosclerosis of native arteries of left leg with ulceration of heel and midfoot: Secondary | ICD-10-CM

## 2019-08-02 DIAGNOSIS — Z8249 Family history of ischemic heart disease and other diseases of the circulatory system: Secondary | ICD-10-CM | POA: Insufficient documentation

## 2019-08-02 DIAGNOSIS — Z79899 Other long term (current) drug therapy: Secondary | ICD-10-CM | POA: Insufficient documentation

## 2019-08-02 DIAGNOSIS — N400 Enlarged prostate without lower urinary tract symptoms: Secondary | ICD-10-CM | POA: Diagnosis not present

## 2019-08-02 DIAGNOSIS — Z888 Allergy status to other drugs, medicaments and biological substances status: Secondary | ICD-10-CM | POA: Diagnosis not present

## 2019-08-02 DIAGNOSIS — J449 Chronic obstructive pulmonary disease, unspecified: Secondary | ICD-10-CM | POA: Insufficient documentation

## 2019-08-02 DIAGNOSIS — Z6832 Body mass index (BMI) 32.0-32.9, adult: Secondary | ICD-10-CM | POA: Insufficient documentation

## 2019-08-02 DIAGNOSIS — Z89511 Acquired absence of right leg below knee: Secondary | ICD-10-CM | POA: Insufficient documentation

## 2019-08-02 DIAGNOSIS — I5042 Chronic combined systolic (congestive) and diastolic (congestive) heart failure: Secondary | ICD-10-CM | POA: Insufficient documentation

## 2019-08-02 DIAGNOSIS — Z95828 Presence of other vascular implants and grafts: Secondary | ICD-10-CM | POA: Insufficient documentation

## 2019-08-02 DIAGNOSIS — I255 Ischemic cardiomyopathy: Secondary | ICD-10-CM | POA: Insufficient documentation

## 2019-08-02 DIAGNOSIS — E1151 Type 2 diabetes mellitus with diabetic peripheral angiopathy without gangrene: Secondary | ICD-10-CM | POA: Diagnosis present

## 2019-08-02 DIAGNOSIS — L97529 Non-pressure chronic ulcer of other part of left foot with unspecified severity: Secondary | ICD-10-CM | POA: Diagnosis not present

## 2019-08-02 DIAGNOSIS — E11621 Type 2 diabetes mellitus with foot ulcer: Secondary | ICD-10-CM | POA: Diagnosis not present

## 2019-08-02 DIAGNOSIS — Z955 Presence of coronary angioplasty implant and graft: Secondary | ICD-10-CM | POA: Insufficient documentation

## 2019-08-02 DIAGNOSIS — G4733 Obstructive sleep apnea (adult) (pediatric): Secondary | ICD-10-CM | POA: Diagnosis not present

## 2019-08-02 HISTORY — PX: LOWER EXTREMITY ANGIOGRAPHY: CATH118251

## 2019-08-02 LAB — CREATININE, SERUM
Creatinine, Ser: 1.01 mg/dL (ref 0.61–1.24)
GFR calc Af Amer: >60 mL/min (ref >60)
GFR calc non Af Amer: >60 mL/min (ref >60)

## 2019-08-02 LAB — GLUCOSE, CAPILLARY: Glucose-Capillary: 136 mg/dL — ABNORMAL HIGH (ref 70–99)

## 2019-08-02 LAB — BUN: BUN: 11 mg/dL (ref 6–20)

## 2019-08-02 SURGERY — LOWER EXTREMITY ANGIOGRAPHY
Anesthesia: Moderate Sedation | Site: Leg Lower | Laterality: Left

## 2019-08-02 MED ORDER — SODIUM CHLORIDE 0.9 % IV BOLUS
INTRAVENOUS | Status: AC | PRN
  Administered 2019-08-02: 250 mL via INTRAVENOUS

## 2019-08-02 MED ORDER — ACETAMINOPHEN 325 MG PO TABS: 650.0000 mg | ORAL_TABLET | ORAL | Status: DC | PRN

## 2019-08-02 MED ORDER — ONDANSETRON HCL 4 MG/2ML IJ SOLN: 4.0000 mg | Freq: Four times a day (QID) | INTRAMUSCULAR | Status: DC | PRN

## 2019-08-02 MED ORDER — SODIUM CHLORIDE 0.9% FLUSH: 3.0000 mL | INTRAVENOUS | Status: DC | PRN

## 2019-08-02 MED ORDER — OXYCODONE HCL 5 MG PO TABS: 10.0000 mg | ORAL_TABLET | Freq: Once | ORAL | Status: DC

## 2019-08-02 MED ORDER — SODIUM CHLORIDE 0.9% FLUSH: 3.0000 mL | Freq: Two times a day (BID) | INTRAVENOUS | Status: DC

## 2019-08-02 MED ORDER — HEPARIN SODIUM (PORCINE) 1000 UNIT/ML IJ SOLN
INTRAMUSCULAR | Status: AC
  Filled 2019-08-02: qty 1

## 2019-08-02 MED ORDER — MIDAZOLAM HCL 2 MG/ML PO SYRP: 8.0000 mg | ORAL_SOLUTION | Freq: Once | ORAL | Status: DC | PRN

## 2019-08-02 MED ORDER — HYDROMORPHONE HCL 1 MG/ML IJ SOLN: 1.0000 mg | Freq: Once | INTRAMUSCULAR | Status: DC | PRN

## 2019-08-02 MED ORDER — FAMOTIDINE 20 MG PO TABS: 40.0000 mg | ORAL_TABLET | Freq: Once | ORAL | Status: DC | PRN

## 2019-08-02 MED ORDER — SODIUM CHLORIDE 0.9 % IV SOLN: INTRAVENOUS | Status: DC

## 2019-08-02 MED ORDER — OXYCODONE HCL 5 MG PO TABS
ORAL_TABLET | ORAL | Status: AC
  Administered 2019-08-02: 10 mg
  Filled 2019-08-02: qty 2

## 2019-08-02 MED ORDER — CEFAZOLIN SODIUM-DEXTROSE 2-4 GM/100ML-% IV SOLN
INTRAVENOUS | Status: AC
  Administered 2019-08-02: 2 g via INTRAVENOUS
  Filled 2019-08-02: qty 100

## 2019-08-02 MED ORDER — FENTANYL CITRATE (PF) 100 MCG/2ML IJ SOLN
INTRAMUSCULAR | Status: DC | PRN
  Administered 2019-08-02: 25 ug via INTRAVENOUS
  Administered 2019-08-02: 50 ug via INTRAVENOUS

## 2019-08-02 MED ORDER — DIPHENHYDRAMINE HCL 50 MG/ML IJ SOLN: 50.0000 mg | Freq: Once | INTRAMUSCULAR | Status: DC | PRN

## 2019-08-02 MED ORDER — HEPARIN SODIUM (PORCINE) 1000 UNIT/ML IJ SOLN
INTRAMUSCULAR | Status: DC | PRN
  Administered 2019-08-02: 5000 [IU] via INTRAVENOUS

## 2019-08-02 MED ORDER — MIDAZOLAM HCL 2 MG/2ML IJ SOLN
INTRAMUSCULAR | Status: DC | PRN
  Administered 2019-08-02: 1 mg via INTRAVENOUS
  Administered 2019-08-02: 2 mg via INTRAVENOUS

## 2019-08-02 MED ORDER — CEFAZOLIN SODIUM-DEXTROSE 2-4 GM/100ML-% IV SOLN: 2.0000 g | Freq: Once | INTRAVENOUS | Status: AC

## 2019-08-02 MED ORDER — METHYLPREDNISOLONE SODIUM SUCC 125 MG IJ SOLR: 125.0000 mg | Freq: Once | INTRAMUSCULAR | Status: DC | PRN

## 2019-08-02 MED ORDER — FENTANYL CITRATE (PF) 100 MCG/2ML IJ SOLN
INTRAMUSCULAR | Status: AC
  Filled 2019-08-02: qty 2

## 2019-08-02 MED ORDER — LABETALOL HCL 5 MG/ML IV SOLN: 10.0000 mg | INTRAVENOUS | Status: DC | PRN

## 2019-08-02 MED ORDER — SODIUM CHLORIDE 0.9 % IV SOLN: 250.0000 mL | INTRAVENOUS | Status: DC | PRN

## 2019-08-02 MED ORDER — HYDRALAZINE HCL 20 MG/ML IJ SOLN: 5.0000 mg | INTRAMUSCULAR | Status: DC | PRN

## 2019-08-02 MED ORDER — MIDAZOLAM HCL 5 MG/5ML IJ SOLN
INTRAMUSCULAR | Status: AC
  Filled 2019-08-02: qty 5

## 2019-08-02 SURGICAL SUPPLY — 21 items
BALLN LUTONIX 018 4X100X130 (BALLOONS) ×2
BALLN LUTONIX 018 5X100X130 (BALLOONS) ×2
BALLN ULTRV 018 4X40X75 (BALLOONS)
BALLOON LUTONIX 018 4X100X130 (BALLOONS) ×1 IMPLANT
BALLOON LUTONIX 018 5X100X130 (BALLOONS) ×1 IMPLANT
BALLOON ULTRV 018 4X40X75 (BALLOONS) IMPLANT
CATH BEACON 5 .038 100 VERT TP (CATHETERS) ×2 IMPLANT
CATH PIG 70CM (CATHETERS) ×2 IMPLANT
DEVICE PRESTO INFLATION (MISCELLANEOUS) ×2 IMPLANT
DEVICE STARCLOSE SE CLOSURE (Vascular Products) ×2 IMPLANT
GLIDEWIRE ADV .035X260CM (WIRE) ×2 IMPLANT
PACK ANGIOGRAPHY (CUSTOM PROCEDURE TRAY) ×2 IMPLANT
SHEATH ANL2 6FRX45 HC (SHEATH) ×2 IMPLANT
SHEATH BRITE TIP 5FRX11 (SHEATH) ×2 IMPLANT
SHEATH BRITE TIP 6FR X 23 (SHEATH) ×2 IMPLANT
SHEATH HIGHFLEX ANSEL 6FRX55 (SHEATH) ×2 IMPLANT
STENT VIABAHN 6X100X120 (Permanent Stent) ×2 IMPLANT
SYR MEDRAD MARK 7 150ML (SYRINGE) ×2 IMPLANT
TUBING CONTRAST HIGH PRESS 72 (TUBING) ×2 IMPLANT
WIRE G V18X300CM (WIRE) ×2 IMPLANT
WIRE J 3MM .035X145CM (WIRE) ×2 IMPLANT

## 2019-08-02 NOTE — Op Note (Signed)
[facility]  Percutaneous Study/Intervention Procedural Note   Date of Surgery: 08/02/2019  Surgeon(s):Meeya Goldin    Assistants:none  Pre-operative Diagnosis: PAD with ulceration left foot  Post-operative diagnosis:  Same  Procedure(s) Performed:             1.  Ultrasound guidance for vascular access right femoral artery             2.  Catheter placement into left common femoral artery from right femoral approach             3.  Aortogram and selective left lower extremity angiogram including a selective image of the left posterior tibial artery             4.  Percutaneous transluminal angioplasty of left proximal posterior tibial artery and tibioperoneal trunk with 4 mm diameter by 10 cm length Lutonix drug-coated angioplasty balloon             5.   Percutaneous transluminal angioplasty of the proximal SFA with 5 mm diameter Lutonix drug-coated angioplasty balloon end of the distal SFA and proximal popliteal artery with 4 mm diameter angioplasty balloon  6.  Viabahn stent placement to the left distal SFA and proximal popliteal artery with 6 mm diameter by 10 cm length stent             7.  StarClose closure device right femoral artery  EBL: 10 cc  Contrast: 70 cc  Fluoro Time: 9.3 minutes  Moderate Conscious Sedation Time: approximately 40 minutes using 3 mg of Versed and 75 mcg of Fentanyl              Indications:  Patient is a 59 y.o.male with worsening ulcerations of the left foot. The patient has noninvasive study showing reduced perfusion distally despite previous revascularization a couple of months ago. The patient is brought in for angiography for further evaluation and potential treatment.  Due to the limb threatening nature of the situation, angiogram was performed for attempted limb salvage. The patient is aware that if the procedure fails, amputation would be expected.  The patient also understands that even with successful revascularization,  amputation may still be required due to the severity of the situation.  Risks and benefits are discussed and informed consent is obtained.   Procedure:  The patient was identified and appropriate procedural time out was performed.  The patient was then placed supine on the table and prepped and draped in the usual sterile fashion. Moderate conscious sedation was administered during a face to face encounter with the patient throughout the procedure with my supervision of the RN administering medicines and monitoring the patient's vital signs, pulse oximetry, telemetry and mental status throughout from the start of the procedure until the patient was taken to the recovery room. Ultrasound was used to evaluate the right common femoral artery.  It was diseased but flow was present.  A digital ultrasound image was acquired.  A Seldinger needle was used to access the right common femoral artery under direct ultrasound guidance and a permanent image was performed.  A 0.035 J wire was advanced without resistance and a 5Fr sheath was placed.  Pigtail catheter was placed into the aorta and an AP aortogram was performed.  The renal arteries appeared patent.  Aorta was mild to moderately irregular but not stenotic.  The previously placed stents in the left iliac artery were widely patent without significant stenosis.  Only mild disease was seen in the right  iliac system. I then crossed the aortic bifurcation and advanced to the left femoral head. Selective left lower extremity angiogram was then performed. This demonstrated some degree of hyperplasia at the leading edge of the previously placed stent the proximal SFA that may have created a greater than 50% stenosis although is a little difficult to discern.  The previously placed stents were all widely patent.  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  The above-knee popliteal artery then normalized down to the tibioperoneal trunk where there was a recurrent stenosis in the 70% range that was fairly short segment but tracked into the proximal posterior tibial artery that was the dominant runoff into the foot for several centimeters.  The peroneal artery provided a second runoff vessel distally.  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 It was felt that it was in the patient's best interest to proceed with intervention after these images to avoid a second procedure and a larger amount of contrast and fluoroscopy based off of the findings from the initial angiogram. The patient was systemically heparinized and a 6 French sheath was then placed over the Terumo Advantage wire. I then used a Kumpe catheter and the advantage wire to navigate through the SFA lesions proximally and distally and get down into the below-knee popliteal artery.  I then used a Kumpe catheter and a V 18 wire to cross the tibioperoneal trunk and proximal posterior tibial artery lesions and advanced the catheter into the proximal to mid posterior tibial artery.  Selective imaging the posterior tibial artery showed the remainder the posterior tibial artery to be continuous into the foot without focal stenosis beyond the proximal segment.  I then replaced the V 18 wire and proceeded with treatment.  A 4 mm diameter by 10 cm length Lutonix drug-coated angioplasty balloon was inflated in the proximal posterior tibial artery and tibioperoneal trunk and inflated to 8 atm for 1 minute.  This was used to predilate the chronic dissection/stenosis in the distal SFA and proximal popliteal artery and inflated to 12 atm for 1 minute in this location.  I then placed a 6 mm diameter by 10 cm length Viabahn stent in the distal SFA and proximal popliteal artery.  I used a 5 mm diameter by 10 cm length Lutonix drug-coated angioplasty balloon and used this to first treat the proximal SFA in  the area of hyperplastic stenosis where waist was seen but resolved in about 8 to 10 atm and this was held for 1 minute.  I then used the same 5 mm balloon to post dilate the Viabahn stent in the distal SFA and above-knee popliteal artery.  Completion imaging showed less than 10% residual stenosis in the proximal SFA with improvement in the hyperplastic lesion.  The Viabahn stent in the distal SFA and above-knee popliteal artery was widely patent with less than 10% stenosis.  The tibioperoneal trunk lesion had about a 15 to 20% residual stenosis.  There was some spasm of the posterior tibial artery just distal to the area ballooned but otherwise flow was maintained distally and markedly improved. I elected to terminate the procedure. The sheath was removed and StarClose closure device was deployed in the right femoral artery with excellent hemostatic result. The patient was taken to the recovery room in stable condition having tolerated the procedure well.  Findings:  Aortogram:  Renal arteries appeared patent.  Aorta was mild to moderately irregular but not stenotic.  The previously placed stents in the left iliac artery were widely patent without significant stenosis.  Only mild disease was seen in the right iliac system.             Left lower Extremity:  This demonstrated some degree of hyperplasia at the leading edge of the previously placed stent the proximal SFA that may have created a greater than 50% stenosis although is a little difficult to discern.  The previously placed stents were all widely patent.  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  The above-knee popliteal artery then normalized down to the tibioperoneal trunk where there was a recurrent stenosis in the 70% range that was fairly short segment but tracked into the proximal posterior tibial artery  that was the dominant runoff into the foot for several centimeters.  The peroneal artery provided a second runoff vessel distally.  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   Disposition: Patient was taken to the recovery room in stable condition having tolerated the procedure well.  Complications: None  Leotis Pain 08/02/2019 11:33 AM   This note was created with Dragon Medical transcription system. Any errors in dictation are purely unintentional.

## 2019-08-02 NOTE — H&P (Signed)
[facility] History & Physical Update  The patient was interviewed and re-examined.  The patient's previous History and Physical has been reviewed and is unchanged.  There is no change in the plan of care. We plan to proceed with the scheduled procedure.  Leotis Pain, MD  08/02/2019, 9:30 AM

## 2019-08-04 ENCOUNTER — Telehealth (INDEPENDENT_AMBULATORY_CARE_PROVIDER_SITE_OTHER): Payer: Self-pay | Admitting: Vascular Surgery

## 2019-08-04 NOTE — Telephone Encounter (Signed)
The stent was placed the end of his thigh and beginning of his knee.  Let's see if we can get him in for ABIs tomorrow

## 2019-08-04 NOTE — Telephone Encounter (Signed)
Patient called stating that he is in excruciating pain today and that he doesn't remember seeing the doctor before or after his procedure. He states he was not given a discharge or any instructions. I advised him I will print the d/c summary and mail it to him. He also requested to know the location of the stent where is begins and ends. pls advise

## 2019-08-04 NOTE — Telephone Encounter (Signed)
I called the pt and made him aware of where his stent was placed, he mentioned waiting for a call for his appt and I assured him that he would receive a call as the NP want to try an see him tomorrow but he mentioned Friday

## 2019-08-10 ENCOUNTER — Other Ambulatory Visit (INDEPENDENT_AMBULATORY_CARE_PROVIDER_SITE_OTHER): Payer: Self-pay | Admitting: Vascular Surgery

## 2019-08-10 DIAGNOSIS — I70245 Atherosclerosis of native arteries of left leg with ulceration of other part of foot: Secondary | ICD-10-CM

## 2019-08-10 DIAGNOSIS — Z95820 Peripheral vascular angioplasty status with implants and grafts: Secondary | ICD-10-CM

## 2019-08-11 ENCOUNTER — Ambulatory Visit (INDEPENDENT_AMBULATORY_CARE_PROVIDER_SITE_OTHER): Payer: Medicare Other | Admitting: Nurse Practitioner

## 2019-08-11 ENCOUNTER — Telehealth (INDEPENDENT_AMBULATORY_CARE_PROVIDER_SITE_OTHER): Payer: Self-pay

## 2019-08-11 ENCOUNTER — Other Ambulatory Visit: Payer: Self-pay

## 2019-08-11 ENCOUNTER — Ambulatory Visit (INDEPENDENT_AMBULATORY_CARE_PROVIDER_SITE_OTHER): Payer: Medicare Other

## 2019-08-11 ENCOUNTER — Encounter (INDEPENDENT_AMBULATORY_CARE_PROVIDER_SITE_OTHER): Payer: Self-pay | Admitting: Nurse Practitioner

## 2019-08-11 VITALS — BP 120/81 | HR 98 | Resp 16 | Wt 208.0 lb

## 2019-08-11 DIAGNOSIS — L089 Local infection of the skin and subcutaneous tissue, unspecified: Secondary | ICD-10-CM | POA: Diagnosis not present

## 2019-08-11 DIAGNOSIS — E11628 Type 2 diabetes mellitus with other skin complications: Secondary | ICD-10-CM | POA: Diagnosis not present

## 2019-08-11 DIAGNOSIS — I890 Lymphedema, not elsewhere classified: Secondary | ICD-10-CM | POA: Diagnosis not present

## 2019-08-11 DIAGNOSIS — I7025 Atherosclerosis of native arteries of other extremities with ulceration: Secondary | ICD-10-CM | POA: Diagnosis not present

## 2019-08-11 DIAGNOSIS — Z95820 Peripheral vascular angioplasty status with implants and grafts: Secondary | ICD-10-CM

## 2019-08-11 DIAGNOSIS — I70245 Atherosclerosis of native arteries of left leg with ulceration of other part of foot: Secondary | ICD-10-CM

## 2019-08-11 MED ORDER — COLLAGENASE 250 UNIT/GM EX OINT: 1.0000 "application " | TOPICAL_OINTMENT | Freq: Every day | CUTANEOUS | 1 refills | Status: DC

## 2019-08-11 NOTE — Telephone Encounter (Signed)
It has been sent!

## 2019-08-12 ENCOUNTER — Encounter (INDEPENDENT_AMBULATORY_CARE_PROVIDER_SITE_OTHER): Payer: Self-pay | Admitting: Nurse Practitioner

## 2019-08-12 NOTE — Progress Notes (Signed)
SUBJECTIVE:  Patient ID: [name] [name], male    DOB: 02-28-1961, 59 y.o.   MRN: [id number] Chief Complaint  Patient presents with  . Follow-up    HPI  [name] [name] is a 59 y.o. male that presents today following angiogram on 08/02/2019.  Patient contacted our office several days after his angiogram stating that he had severe pain in his left lower extremity.  However today that pain has resolved.  The patient does endorse having some swelling of the lower extremity.  Currently the patient has an ulceration on the medial portion of his left heel.  Today the wound appears to be more shallow than previously and it currently does not have any drainage.  The patient denies any fever, chills, nausea, vomiting or diarrhea.  The patient still has not been able to obtain an x-ray or visit podiatry.  Today noninvasive study showed ABI 0.95, with the previous ABI of 0.90.  This is compared to 06/25/2019 study.  The patient does have monophasic tibial artery waveforms however the left toe digits are stronger than previously.  Past Medical History:  Diagnosis Date  . Arthritis   . Asthma   . Atherosclerosis   . BPH (benign prostatic hyperplasia)   . Carotid arterial disease ([place])   . Charcot's joint of foot, right   . Chronic combined systolic (congestive) and diastolic (congestive) heart failure ([place])    a. 02/2013 EF 50% by LV gram; b. 04/2017 Echo: EF 25-30%. diff HK. Gr2 DD. Mod dil LA/RV. PASP 21mmHg.  [name] COPD (chronic obstructive pulmonary disease) ([place])   . Coronary artery disease    a. 2013 S/P PCI of LAD [facility]);  b. 03/2013 PCI: RCA 90p ( 2.5 x 23 mm DES); c. 02/2014 Cath: patent RCA stent->Med Rx; d. 04/2017 Cath: LM nl, LAd 30ost/p, 100m/d, D1 80ost, LCX 90p/m, OM1 85, RCA 70/20p, 34m, 70d->Referred for CT Surg-felt to be poor candidate.  . Diabetic neuropathy ([place])   . Diabetic ulcer of right foot ([place])   . Hernia   . Hyperlipidemia   . Hypertension   . Ischemic cardiomyopathy    a.  04/2017 Echo: EF 25-30%; b. 08/2017 Cardiac MRI ([facility]): EF 19%, sev glob HK. RVEF 20%, mod BAE, triv MR, mild-mod TR. Basal lateral subendocardial infarct (viable), inf/infsept, dist septal ischemia, basal to mid lat peri-infarct ischemia.  . Morbid obesity ([place])   . Neuropathy   . PAD (peripheral artery disease) ([place])    a. Followed by Dr. Lucky Cowboy; b. 08/2010 Periph Angio: RSFA 70-80p (6X60 self-expanding stent); c. 09/2010 Periph Angio: L SFA 100p (7x unknown length self-expanding stent); d. 06/2015 Periph Angio: R SFA short segment occlusion (6x12 self-expanding stent).  . Restless leg syndrome   . Sleep apnea   . Subclavian artery stenosis, right ([place])   . Syncope and collapse   . Tobacco use    a. 75+ yr hx - still smoking 1ppd, down from 2 ppd.  . Type II diabetes mellitus ([place])   . Varicose vein     Past Surgical History:  Procedure Laterality Date  . ABDOMINAL AORTAGRAM N/A 06/23/2013   Procedure: ABDOMINAL Maxcine Ham;  Surgeon: Wellington Hampshire, MD;  Location: [facility] CATH [facility];  Service: Cardiovascular;  Laterality: N/A;  . AMPUTATION Right 04/01/2018   Procedure: AMPUTATION BELOW KNEE;  Surgeon: Algernon Huxley, MD;  Location: [facility] ORS;  Service: Vascular;  Laterality: Right;  . APPENDECTOMY    . CARDIAC CATHETERIZATION  10/14   [place]; X1  STENT PROXIMAL RCA  . CARDIAC CATHETERIZATION  02/2010   [facility]  . CARDIAC CATHETERIZATION  02/21/2014   [facility]  . CLAVICLE SURGERY    . HERNIA REPAIR    . IRRIGATION AND DEBRIDEMENT FOOT Right 01/04/2018   Procedure: IRRIGATION AND DEBRIDEMENT FOOT;  Surgeon: Samara Deist, DPM;  Location: [facility] ORS;  Service: Podiatry;  Laterality: Right;  . LEFT HEART CATH AND CORS/GRAFTS ANGIOGRAPHY N/A 04/28/2017   Procedure: LEFT HEART CATH AND CORONARY ANGIOGRAPHY;  Surgeon: Wellington Hampshire, MD;  Location: [place] CV LAB;  Service: Cardiovascular;  Laterality: N/A;  . LOWER EXTREMITY ANGIOGRAPHY Right 03/03/2018   Procedure: LOWER EXTREMITY ANGIOGRAPHY;  Surgeon:  Katha Cabal, MD;  Location: [place] [facility];  Service: Cardiovascular;  Laterality: Right;  . LOWER EXTREMITY ANGIOGRAPHY Left 08/24/2018   Procedure: LOWER EXTREMITY ANGIOGRAPHY;  Surgeon: Algernon Huxley, MD;  Location: [place] [facility] LAB;  Service: Cardiovascular;  Laterality: Left;  . LOWER EXTREMITY ANGIOGRAPHY Left 06/14/2019   Procedure: LOWER EXTREMITY ANGIOGRAPHY;  Surgeon: Algernon Huxley, MD;  Location: [place] CV LAB;  Service: Cardiovascular;  Laterality: Left;  . LOWER EXTREMITY ANGIOGRAPHY Left 08/02/2019   Procedure: LOWER EXTREMITY ANGIOGRAPHY;  Surgeon: Algernon Huxley, MD;  Location: [place] [facility];  Service: Cardiovascular;  Laterality: Left;  . PERIPHERAL ARTERIAL STENT GRAFT     x2 left/right  . PERIPHERAL VASCULAR BALLOON ANGIOPLASTY Right 01/06/2018   Procedure: PERIPHERAL VASCULAR BALLOON ANGIOPLASTY;  Surgeon: Katha Cabal, MD;  Location: [place] CV [facility];  Service: Cardiovascular;  Laterality: Right;  . PERIPHERAL VASCULAR CATHETERIZATION N/A 07/06/2015   Procedure: Abdominal Aortogram w/Lower Extremity;  Surgeon: Algernon Huxley, MD;  Location: [place] [facility];  Service: Cardiovascular;  Laterality: N/A;  . PERIPHERAL VASCULAR CATHETERIZATION  07/06/2015   Procedure: Lower Extremity Intervention;  Surgeon: Algernon Huxley, MD;  Location: [place] [facility];  Service: Cardiovascular;;    Social History   Socioeconomic History  . Marital status: Single    Spouse name: Not on file  . Number of children: 0  . Years of education: 36  . Highest education level: 12th grade  Occupational History  . Occupation: on disability  Tobacco Use  . Smoking status: Current Every Day Smoker    Packs/day: 1.00    Years: 41.00    Pack years: 41.00    Types: Cigarettes  . Smokeless tobacco: Never Used  Substance and Sexual Activity  . Alcohol use: No  . Drug use: No  . Sexual activity: Yes  Other Topics Concern  . Not on file  Social History Narrative     Lives at home in [place] with girlfriend. Independent at baseline.   Social Determinants of Health   Financial Resource Strain: Low Risk   . Difficulty of Paying Living Expenses: Not hard at all  Food Insecurity: No Food Insecurity  . Worried About Charity fundraiser in the Last Year: Never true  . Ran Out of Food in the Last Year: Never true  Transportation Needs:   . Lack of Transportation (Medical): Not on file  . Lack of Transportation (Non-Medical): Not on file  Physical Activity:   . Days of Exercise per Week: Not on file  . Minutes of Exercise per Session: Not on file  Stress: Stress Concern Present  . Feeling of Stress : Rather much  Social Connections:   . Frequency of Communication with Friends and Family: Not on file  . Frequency  of Social Gatherings with Friends and Family: Not on file  . Attends Religious Services: Not on file  . Active Member of Clubs or Organizations: Not on file  . Attends Archivist Meetings: Not on file  . Marital Status: Not on file  Intimate Partner Violence:   . Fear of Current or Ex-Partner: Not on file  . Emotionally Abused: Not on file  . Physically Abused: Not on file  . Sexually Abused: Not on file    Family History  Problem Relation Age of Onset  . Heart attack Father   . Heart disease Father   . Hypertension Father   . Hyperlipidemia Father   . Hypertension Mother   . Hyperlipidemia Mother     Allergies  Allergen Reactions  . Dulaglutide Anaphylaxis, Diarrhea and Hives  . Other Itching    Skin itching associated with nitro patch  . Prednisone Rash     Review of Systems   Review of Systems: Negative Unless Checked Constitutional: [] Weight loss  [] Fever  [] Chills Cardiac: [] Chest pain   []  Atrial Fibrillation  [] Palpitations   [] Shortness of breath when laying flat   [] Shortness of breath with exertion. [] Shortness of breath at rest Vascular:  [] Pain in legs with walking   [] Pain in legs with standing  [] Pain in legs when laying flat   [] Claudication    [] Pain in feet when laying flat    [] History of DVT   [] Phlebitis   [x] Swelling in legs   [] Varicose veins   [x] Non-healing ulcers Pulmonary:   [] Uses home oxygen   [] Productive cough   [] Hemoptysis   [] Wheeze  [] COPD   [] Asthma Neurologic:  [] Dizziness   [] Seizures  [] Blackouts [] History of stroke   [] History of TIA  [] Aphasia   [] Temporary Blindness   [] Weakness or numbness in arm   [x] Weakness or numbness in leg Musculoskeletal:   [] Joint swelling   [] Joint pain   [] Low back pain  []  History of Knee Replacement [x] Arthritis [] back Surgeries  []  Spinal Stenosis    Hematologic:  [] Easy bruising  [] Easy bleeding   [] Hypercoagulable state   [] Anemic Gastrointestinal:  [] Diarrhea   [] Vomiting  [] Gastroesophageal reflux/heartburn   [] Difficulty swallowing. [] Abdominal pain Genitourinary:  [] Chronic kidney disease   [] Difficult urination  [] Anuric   [] Blood in urine [] Frequent urination  [] Burning with urination   [] Hematuria Skin:  [] Rashes   [] Ulcers [] Wounds Psychological:  [] History of anxiety   []  History of major depression  []  Memory Difficulties      OBJECTIVE:   Physical Exam  BP 120/81 (BP Location: Right Arm)   Pulse 98   Resp 16   Wt 208 lb (94.3 kg)   BMI 32.58 kg/m   Gen: WD/WN, NAD Head: [place]/AT, No temporalis wasting.  Ear/Nose/Throat: Hearing grossly intact, nares w/o erythema or drainage Eyes: PER, EOMI, sclera nonicteric.  Neck: Supple, no masses.  No JVD.  Pulmonary:  Good air movement, no use of accessory muscles.  Cardiac: RRR Vascular:  2+ edema left lower extremity Vessel Right Left  Radial Palpable Palpable  Dorsalis Pedis   not palpable  Posterior Tibial   not palpable   Gastrointestinal: soft, non-distended. No guarding/no peritoneal signs.  Musculoskeletal: M/S 5/5 throughout.  No deformity or atrophy.  Neurologic: Pain and light touch intact in extremities.  Symmetrical.  Speech is fluent. Motor exam as  listed above. Psychiatric: Judgment intact, Mood & affect appropriate for pt's clinical situation. Dermatologic:  Ulceration left medial heel.  No changes consistent  with cellulitis. Lymph : No Cervical lymphadenopathy, no lichenification or skin changes of chronic lymphedema.       ASSESSMENT AND PLAN:  1. Atherosclerosis of native arteries of the extremities with ulceration ([place]) We will continue to have home health do wound dressings.  It is encouraging that the wound is smaller than previously seen.  There is also no signs of infection as it was previously.  We will have the patient return to the office in 3 weeks to reevaluate the lower extremity wound.  2. Diabetic foot infection ([place]) Today the wound is not using any purulent drainage like previously.  The patient does have home health to help with wound care.  The patient is again strongly advised to obtain an x-ray to look for signs symptoms of osteomyelitis.  This is because with osteomyelitis we may need to treat the patient longer to prevent worsening infection.  Patient understands will obtain x-ray.  3. Lymphedema The patient's edema is better controlled than previously.  There are no open ulcerations.  Patient will continue with conservative therapy.   Current Outpatient Medications on File Prior to Visit  Medication Sig Dispense Refill  . albuterol (VENTOLIN HFA) 108 (90 Base) MCG/ACT inhaler Inhale into the lungs every 6 (six) hours as needed for wheezing or shortness of breath.    [name] aspirin EC 81 MG tablet Take 1 tablet (81 mg total) by mouth daily. 90 tablet 3  . atorvastatin (LIPITOR) 80 MG tablet Take 80 mg by mouth daily.    . calcium carbonate (TUMS - DOSED IN MG ELEMENTAL CALCIUM) 500 MG chewable tablet Chew 4 tablets by mouth daily as needed for indigestion or heartburn.    . clindamycin (CLEOCIN) 300 MG capsule Take by mouth.    . clopidogrel (PLAVIX) 75 MG tablet Take 75 mg by mouth daily.     . diphenhydrAMINE  (BENADRYL) 25 MG tablet Take 25 mg by mouth daily as needed for allergies.    [name] ENTRESTO 24-26 MG TAKE 1 TABLET BY MOUTH TWICE DAILY 60 tablet 3  . ezetimibe (ZETIA) 10 MG tablet Take 1 tablet (10 mg total) by mouth daily. 30 tablet 5  . Insulin Glargine (LANTUS SOLOSTAR) 100 UNIT/ML Solostar Pen Inject 30 Units into the skin at bedtime. 15 mL 11  . insulin lispro (HUMALOG) 100 UNIT/ML injection Sliding Scale Insulin before meals using Novolog If BS is 0 to 70 no insulin and eat/glucose tablets/glucose get If BS 71 to 150 great! If BS 151 to 200, 2 Units Novolog If BS 201 to 250, 4 Units Novolog If BS 251 to 300, 6 Units Novolog If BS 301 to 350, 8 Units Novolog If BS 351 to 400, 10 Units Novolog If BS greater than 400, take 10 Units and Contact our office during office hour    . ipratropium-albuterol (DUONEB) 0.5-2.5 (3) MG/3ML SOLN Inhale 3 mLs into the lungs every 6 (six) hours as needed (wheezing/sob).     . isosorbide mononitrate (IMDUR) 30 MG 24 hr tablet Take 1 tablet (30 mg total) by mouth daily. 30 tablet 5  . metFORMIN (GLUCOPHAGE-XR) 500 MG 24 hr tablet Take 1,000 mg by mouth 2 (two) times daily.     . metoprolol succinate (TOPROL-XL) 25 MG 24 hr tablet Take 1 tablet (25 mg total) by mouth daily. 15 tablet 5  . Multiple Vitamin (MULTI-VITAMINS) TABS Take 1 tablet by mouth daily.     . multivitamin-lutein (OCUVITE-LUTEIN) CAPS capsule Take 1 capsule by mouth daily. [place]  capsule 0  . naproxen (NAPROSYN) 500 MG tablet Take 1 tablet (500 mg total) by mouth 2 (two) times daily with a meal. 20 tablet 2  . nitroGLYCERIN (NITRODUR - DOSED IN MG/24 HR) 0.4 mg/hr patch Place 0.4 mg onto the skin daily.    . nitroGLYCERIN (NITROSTAT) 0.4 MG SL tablet Place 1 tablet (0.4 mg total) under the tongue every 5 (five) minutes as needed for chest pain. 30 tablet 5  . Oxycodone HCl 10 MG TABS Take 10 mg by mouth every 6 (six) hours as needed (pain).     . potassium chloride (KLOR-CON) 10 MEQ tablet Take 1 tablet  (10 mEq total) by mouth daily. (Patient taking differently: Take 20 mEq by mouth daily. 68meq AM/ 81meq PM) 30 tablet 5  . promethazine (PHENERGAN) 25 MG tablet TAKE 1 TABLET BY MOUTH EVERY 6 HOURS AS NEEDED NAUSEA    . spironolactone (ALDACTONE) 25 MG tablet Take 25 mg by mouth daily.     [name] torsemide (DEMADEX) 20 MG tablet Take 40 mg by mouth daily. 40mg  AM/ 20mg  PM    . traZODone (DESYREL) 150 MG tablet Take 150 mg by mouth at bedtime.    . traZODone (DESYREL) 50 MG tablet Take 150 mg by mouth at bedtime as needed for sleep.     [name] gabapentin (NEURONTIN) 800 MG tablet Take 800 mg by mouth 4 (four) times daily.     [name] sulfamethoxazole-trimethoprim (BACTRIM DS) 800-160 MG tablet Take 1 tablet by mouth 2 (two) times daily. (Patient not taking: Reported on 08/11/2019) 28 tablet 0   No current facility-administered medications on file prior to visit.    There are no Patient Instructions on file for this visit. No follow-ups on file.   Kris Hartmann, NP  This note was completed with Sales executive.  Any errors are purely unintentional.

## 2019-08-30 ENCOUNTER — Emergency Department: Payer: Medicare Other

## 2019-08-30 ENCOUNTER — Ambulatory Visit (INDEPENDENT_AMBULATORY_CARE_PROVIDER_SITE_OTHER): Payer: Medicare Other | Admitting: Nurse Practitioner

## 2019-08-30 ENCOUNTER — Encounter: Payer: Self-pay | Admitting: Emergency Medicine

## 2019-08-30 ENCOUNTER — Encounter (INDEPENDENT_AMBULATORY_CARE_PROVIDER_SITE_OTHER): Payer: Medicare Other

## 2019-08-30 ENCOUNTER — Other Ambulatory Visit: Payer: Self-pay

## 2019-08-30 ENCOUNTER — Inpatient Hospital Stay
Admission: EM | Admit: 2019-08-30 | Discharge: 2019-08-31 | DRG: 602 | Discharge status: Against Medical Advice | Payer: Medicare Other | Attending: Internal Medicine | Admitting: Internal Medicine

## 2019-08-30 DIAGNOSIS — Z993 Dependence on wheelchair: Secondary | ICD-10-CM

## 2019-08-30 DIAGNOSIS — Z7902 Long term (current) use of antithrombotics/antiplatelets: Secondary | ICD-10-CM

## 2019-08-30 DIAGNOSIS — I739 Peripheral vascular disease, unspecified: Secondary | ICD-10-CM | POA: Diagnosis present

## 2019-08-30 DIAGNOSIS — S88111A Complete traumatic amputation at level between knee and ankle, right lower leg, initial encounter: Secondary | ICD-10-CM | POA: Diagnosis present

## 2019-08-30 DIAGNOSIS — Z8249 Family history of ischemic heart disease and other diseases of the circulatory system: Secondary | ICD-10-CM

## 2019-08-30 DIAGNOSIS — J441 Chronic obstructive pulmonary disease with (acute) exacerbation: Secondary | ICD-10-CM

## 2019-08-30 DIAGNOSIS — I2510 Atherosclerotic heart disease of native coronary artery without angina pectoris: Secondary | ICD-10-CM | POA: Diagnosis present

## 2019-08-30 DIAGNOSIS — I255 Ischemic cardiomyopathy: Secondary | ICD-10-CM | POA: Diagnosis present

## 2019-08-30 DIAGNOSIS — Z79899 Other long term (current) drug therapy: Secondary | ICD-10-CM

## 2019-08-30 DIAGNOSIS — R0602 Shortness of breath: Secondary | ICD-10-CM

## 2019-08-30 DIAGNOSIS — E785 Hyperlipidemia, unspecified: Secondary | ICD-10-CM | POA: Diagnosis present

## 2019-08-30 DIAGNOSIS — Z89511 Acquired absence of right leg below knee: Secondary | ICD-10-CM

## 2019-08-30 DIAGNOSIS — F17210 Nicotine dependence, cigarettes, uncomplicated: Secondary | ICD-10-CM | POA: Diagnosis present

## 2019-08-30 DIAGNOSIS — Z5329 Procedure and treatment not carried out because of patient's decision for other reasons: Secondary | ICD-10-CM | POA: Diagnosis not present

## 2019-08-30 DIAGNOSIS — Z955 Presence of coronary angioplasty implant and graft: Secondary | ICD-10-CM

## 2019-08-30 DIAGNOSIS — Z794 Long term (current) use of insulin: Secondary | ICD-10-CM

## 2019-08-30 DIAGNOSIS — I110 Hypertensive heart disease with heart failure: Secondary | ICD-10-CM | POA: Diagnosis present

## 2019-08-30 DIAGNOSIS — I252 Old myocardial infarction: Secondary | ICD-10-CM

## 2019-08-30 DIAGNOSIS — E1140 Type 2 diabetes mellitus with diabetic neuropathy, unspecified: Secondary | ICD-10-CM | POA: Diagnosis present

## 2019-08-30 DIAGNOSIS — E1151 Type 2 diabetes mellitus with diabetic peripheral angiopathy without gangrene: Secondary | ICD-10-CM | POA: Diagnosis present

## 2019-08-30 DIAGNOSIS — L03116 Cellulitis of left lower limb: Secondary | ICD-10-CM | POA: Diagnosis not present

## 2019-08-30 DIAGNOSIS — Z7982 Long term (current) use of aspirin: Secondary | ICD-10-CM

## 2019-08-30 DIAGNOSIS — Z792 Long term (current) use of antibiotics: Secondary | ICD-10-CM

## 2019-08-30 DIAGNOSIS — I700 Atherosclerosis of aorta: Secondary | ICD-10-CM | POA: Diagnosis present

## 2019-08-30 DIAGNOSIS — J449 Chronic obstructive pulmonary disease, unspecified: Secondary | ICD-10-CM | POA: Diagnosis present

## 2019-08-30 DIAGNOSIS — Z791 Long term (current) use of non-steroidal anti-inflammatories (NSAID): Secondary | ICD-10-CM

## 2019-08-30 DIAGNOSIS — I5023 Acute on chronic systolic (congestive) heart failure: Secondary | ICD-10-CM | POA: Diagnosis present

## 2019-08-30 DIAGNOSIS — E1169 Type 2 diabetes mellitus with other specified complication: Secondary | ICD-10-CM

## 2019-08-30 DIAGNOSIS — Z8349 Family history of other endocrine, nutritional and metabolic diseases: Secondary | ICD-10-CM

## 2019-08-30 LAB — CBC WITH DIFFERENTIAL/PLATELET
Abs Immature Granulocytes: 0.03 10*3/uL (ref 0.00–0.07)
Basophils Absolute: 0 10*3/uL (ref 0.0–0.1)
Basophils Relative: 1 %
Eosinophils Absolute: 0.1 10*3/uL (ref 0.0–0.5)
Eosinophils Relative: 1 %
HCT: 38.1 % — ABNORMAL LOW (ref 39.0–52.0)
Hemoglobin: 11.8 g/dL — ABNORMAL LOW (ref 13.0–17.0)
Immature Granulocytes: 1 %
Lymphocytes Relative: 15 %
Lymphs Abs: 0.9 10*3/uL (ref 0.7–4.0)
MCH: 22.6 pg — ABNORMAL LOW (ref 26.0–34.0)
MCHC: 31 g/dL (ref 30.0–36.0)
MCV: 73 fL — ABNORMAL LOW (ref 80.0–100.0)
Monocytes Absolute: 0.5 10*3/uL (ref 0.1–1.0)
Monocytes Relative: 8 %
Neutro Abs: 4.4 10*3/uL (ref 1.7–7.7)
Neutrophils Relative %: 74 %
Platelets: 184 10*3/uL (ref 150–400)
RBC: 5.22 MIL/uL (ref 4.22–5.81)
RDW: 25.8 % — ABNORMAL HIGH (ref 11.5–15.5)
Smear Review: NORMAL
WBC: 5.9 10*3/uL (ref 4.0–10.5)
nRBC: 0 % (ref 0.0–0.2)

## 2019-08-30 LAB — BASIC METABOLIC PANEL
Anion gap: 9 (ref 5–15)
BUN: 9 mg/dL (ref 6–20)
CO2: 31 mmol/L (ref 22–32)
Calcium: 8.7 mg/dL — ABNORMAL LOW (ref 8.9–10.3)
Chloride: 93 mmol/L — ABNORMAL LOW (ref 98–111)
Creatinine, Ser: 0.83 mg/dL (ref 0.61–1.24)
GFR calc Af Amer: >60 mL/min (ref >60)
GFR calc non Af Amer: >60 mL/min (ref >60)
Glucose, Bld: 187 mg/dL — ABNORMAL HIGH (ref 70–99)
Potassium: 4 mmol/L (ref 3.5–5.1)
Sodium: 133 mmol/L — ABNORMAL LOW (ref 135–145)

## 2019-08-30 LAB — BLOOD GAS, VENOUS
Acid-Base Excess: 8.3 mmol/L — ABNORMAL HIGH (ref 0.0–2.0)
Bicarbonate: 36.4 mmol/L — ABNORMAL HIGH (ref 20.0–28.0)
O2 Saturation: 7.3 %
Patient temperature: 37
pCO2, Ven: 66 mmHg — ABNORMAL HIGH (ref 44.0–60.0)
pH, Ven: 7.35 (ref 7.250–7.430)
pO2, Ven: <31 mmHg — CL (ref 32.0–45.0)

## 2019-08-30 LAB — TROPONIN I (HIGH SENSITIVITY)
Troponin I (High Sensitivity): 44 ng/L — ABNORMAL HIGH (ref <18)
Troponin I (High Sensitivity): 55 ng/L — ABNORMAL HIGH (ref <18)

## 2019-08-30 LAB — LACTIC ACID, PLASMA: Lactic Acid, Venous: 1.4 mmol/L (ref 0.5–1.9)

## 2019-08-30 LAB — BRAIN NATRIURETIC PEPTIDE: B Natriuretic Peptide: 2054 pg/mL — ABNORMAL HIGH (ref 0.0–100.0)

## 2019-08-30 MED ORDER — IPRATROPIUM-ALBUTEROL 0.5-2.5 (3) MG/3ML IN SOLN
3.0000 mL | Freq: Once | RESPIRATORY_TRACT | Status: AC
  Administered 2019-08-31: 3 mL via RESPIRATORY_TRACT
  Filled 2019-08-30: qty 3

## 2019-08-30 MED ORDER — MORPHINE SULFATE (PF) 4 MG/ML IV SOLN
INTRAVENOUS | Status: AC
  Administered 2019-08-30: 4 mg via INTRAVENOUS
  Filled 2019-08-30: qty 1

## 2019-08-30 MED ORDER — IOHEXOL 350 MG/ML SOLN
75.0000 mL | Freq: Once | INTRAVENOUS | Status: AC | PRN
  Administered 2019-08-30: 23:00:00 75 mL via INTRAVENOUS

## 2019-08-30 MED ORDER — MORPHINE SULFATE (PF) 4 MG/ML IV SOLN: 4.0000 mg | Freq: Once | INTRAVENOUS | Status: AC

## 2019-08-30 MED ORDER — FUROSEMIDE 10 MG/ML IJ SOLN
40.0000 mg | Freq: Once | INTRAMUSCULAR | Status: AC
  Administered 2019-08-30: 40 mg via INTRAVENOUS
  Filled 2019-08-30: qty 4

## 2019-08-30 MED ORDER — VANCOMYCIN HCL 2000 MG/400ML IV SOLN
2000.0000 mg | Freq: Once | INTRAVENOUS | Status: AC
  Administered 2019-08-30: 2000 mg via INTRAVENOUS
  Filled 2019-08-30: qty 400

## 2019-08-30 MED ORDER — SODIUM CHLORIDE 0.9 % IV SOLN
1.0000 g | Freq: Once | INTRAVENOUS | Status: AC
  Administered 2019-08-30: 1 g via INTRAVENOUS
  Filled 2019-08-30: qty 10

## 2019-08-30 MED ORDER — IPRATROPIUM-ALBUTEROL 0.5-2.5 (3) MG/3ML IN SOLN
3.0000 mL | Freq: Once | RESPIRATORY_TRACT | Status: AC
  Administered 2019-08-30: 3 mL via RESPIRATORY_TRACT
  Filled 2019-08-30: qty 3

## 2019-08-30 NOTE — ED Provider Notes (Signed)
[facility] Emergency Department Provider Note  ____________________________________________   First MD Initiated Contact with Patient 08/30/19 2128     (approximate)  I have reviewed the triage vital signs and the nursing notes.  History  Chief Complaint Shortness of Breath and Recurrent Skin Infections    HPI [name] [name] is a 59 y.o. 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 Symptoms started today. Patient describes LLE swelling, tightness, and redness, as well as a small area of scabbing and break in the skin of the anterior shin with serous drainage. He also reports SOB that started today, both at rest and with exertion. Associated wheezing. No chest pain. Typically moves around with a wheelchair. Denies any recent travel or prolonged immobilization (aside from being primarily wheelchair bound). No hx of VTE. Symptoms have been constant since onset. No apparent alleviating or aggravating components.    Past Medical Hx Past Medical History:  Diagnosis Date  . Arthritis   . Asthma   . Atherosclerosis   . BPH (benign prostatic hyperplasia)   . Carotid arterial disease ([place])   . Charcot's joint of foot, right   . Chronic combined systolic (congestive) and diastolic (congestive) heart failure ([place])    a. 02/2013 EF 50% by LV gram; b. 04/2017 Echo: EF 25-30%. diff HK. Gr2 DD. Mod dil LA/RV. PASP 5mmHg.  [name] COPD (chronic obstructive pulmonary disease) ([place])   . Coronary artery disease    a. 2013 S/P PCI of LAD [facility]);  b. 03/2013 PCI: RCA 90p ( 2.5 x 23 mm DES); c. 02/2014 Cath: patent RCA stent->Med Rx; d. 04/2017 Cath: LM nl, LAd 30ost/p, 74m/d, D1 80ost, LCX 90p/m, OM1 85, RCA 70/20p, 37m, 70d->Referred for CT Surg-felt to be poor candidate.  . Diabetic neuropathy ([place])   . Diabetic ulcer of right foot ([place])   . Hernia   . Hyperlipidemia   . Hypertension   . Ischemic  cardiomyopathy    a. 04/2017 Echo: EF 25-30%; b. 08/2017 Cardiac MRI ([facility]): EF 19%, sev glob HK. RVEF 20%, mod BAE, triv MR, mild-mod TR. Basal lateral subendocardial infarct (viable), inf/infsept, dist septal ischemia, basal to mid lat peri-infarct ischemia.  . Morbid obesity ([place])   . Neuropathy   . PAD (peripheral artery disease) ([place])    a. Followed by Dr. Lucky Cowboy; b. 08/2010 Periph Angio: RSFA 70-80p (6X60 self-expanding stent); c. 09/2010 Periph Angio: L SFA 100p (7x unknown length self-expanding stent); d. 06/2015 Periph Angio: R SFA short segment occlusion (6x12 self-expanding stent).  . Restless leg syndrome   . Sleep apnea   . Subclavian artery stenosis, right ([place])   . Syncope and collapse   . Tobacco use    a. 75+ yr hx - still smoking 1ppd, down from 2 ppd.  . Type II diabetes mellitus ([place])   . Varicose vein     Problem List Patient Active Problem List   Diagnosis Date Noted  . Acute on chronic systolic CHF (congestive heart failure) ([place]) 11/05/2018  . Open toe wound 10/01/2018  . Venous ulcer of left leg ([place]) 08/19/2018  . 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[place]) 04/28/2018  . Sepsis ([place]) 03/30/2018  . Abnormal MRI, shoulder (Right) 02/23/2018  . Abnormal MRI, cervical spine (2016) 02/23/2018  . DDD (degenerative disc disease), cervical 02/23/2018  . Cervical foraminal stenosis (Bilateral) 02/23/2018  . Cervical central spinal stenosis 02/23/2018  .  Cervical facet hypertrophy 02/23/2018  . Arthralgia of acromioclavicular joint (Right) 02/23/2018  . Osteoarthritis of  AC (acromioclavicular) joint (Right) 02/23/2018  . Biceps tendinosis of shoulder (Right) 02/23/2018  . Tendinopathy of rotator cuff (Right) 02/23/2018  . Vitamin D deficiency 02/23/2018  . Atherosclerosis of native arteries of the extremities with ulceration ([place]) 02/15/2018  . Peripheral edema 02/05/2018  . Status post peripherally inserted central catheter (PICC) central line placement  02/04/2018  . Chronic lower extremity pain (Primary Area of Pain) (Right) 01/29/2018  . Chronic shoulder pain [facility] Area of Pain) (Right) 01/29/2018  . Chronic foot pain (Secondary Area of Pain) (Right) 01/29/2018  . Chronic pain syndrome 01/29/2018  . Long term current use of opiate analgesic 01/29/2018  . Medication monitoring encounter 01/29/2018  . Disorder of skeletal system 01/29/2018  . Problems influencing health status 01/29/2018  . AKI (acute kidney injury) ([place]) 01/03/2018  . NSTEMI (non-ST elevated myocardial infarction) ([place]) 11/21/2017  . Coronary artery disease of native artery of native heart with stable angina pectoris ([place]) 09/10/2017  . HLD (hyperlipidemia) 09/10/2017  . Angina pectoris ([place]) 09/08/2017  . Angina at rest [facility]) 08/13/2017  . Lymphedema 05/02/2017  . Chronic systolic HF (heart failure) ([place]) 04/23/2017  . Chest pain 07/11/2016  . Unstable angina ([place]) 07/11/2016  . Foot ulcer (Right)   . Stable angina (HCC)   . Pressure injury of skin 05/06/2016  . Diabetic foot infection ([place]) 05/05/2016  . Type 2 diabetes mellitus with complication, with long-term current use of insulin ([place]) 01/26/2016  . Cellulitis 11/21/2015  . Morbid obesity ([place]) 11/17/2014  . PAD (peripheral artery disease) ([place])   . Hypertension   . Tobacco use   . COPD (chronic obstructive pulmonary disease) ([place]) 05/19/2012  . Diabetic neuropathy ([place]) 05/19/2012    Past Surgical Hx Past Surgical History:  Procedure Laterality Date  . ABDOMINAL AORTAGRAM N/A 06/23/2013   Procedure: ABDOMINAL Maxcine Ham;  Surgeon: Wellington Hampshire, MD;  Location: [place] CATH LAB;  Service: Cardiovascular;  Laterality: N/A;  . AMPUTATION Right 04/01/2018   Procedure: AMPUTATION BELOW KNEE;  Surgeon: Algernon Huxley, MD;  Location: [facility] ORS;  Service: Vascular;  Laterality: Right;  . APPENDECTOMY    . CARDIAC CATHETERIZATION  10/14   [facility]; X1 STENT PROXIMAL RCA  . CARDIAC CATHETERIZATION  02/2010   [facility]   . CARDIAC CATHETERIZATION  02/21/2014   [facility]  . CLAVICLE SURGERY    . HERNIA REPAIR    . IRRIGATION AND DEBRIDEMENT FOOT Right 01/04/2018   Procedure: IRRIGATION AND DEBRIDEMENT FOOT;  Surgeon: Samara Deist, DPM;  Location: [facility] ORS;  Service: Podiatry;  Laterality: Right;  . LEFT HEART CATH AND CORS/GRAFTS ANGIOGRAPHY N/A 04/28/2017   Procedure: LEFT HEART CATH AND CORONARY ANGIOGRAPHY;  Surgeon: Wellington Hampshire, MD;  Location: [place] CV LAB;  Service: Cardiovascular;  Laterality: N/A;  . LOWER EXTREMITY ANGIOGRAPHY Right 03/03/2018   Procedure: LOWER EXTREMITY ANGIOGRAPHY;  Surgeon: Katha Cabal, MD;  Location: [place] CV LAB;  Service: Cardiovascular;  Laterality: Right;  . LOWER EXTREMITY ANGIOGRAPHY Left 08/24/2018   Procedure: LOWER EXTREMITY ANGIOGRAPHY;  Surgeon: Algernon Huxley, MD;  Location: [place] CV LAB;  Service: Cardiovascular;  Laterality: Left;  . LOWER EXTREMITY ANGIOGRAPHY Left 06/14/2019   Procedure: LOWER EXTREMITY ANGIOGRAPHY;  Surgeon: Algernon Huxley, MD;  Location: [place] CV LAB;  Service: Cardiovascular;  Laterality: Left;  . LOWER EXTREMITY ANGIOGRAPHY Left 08/02/2019   Procedure: LOWER EXTREMITY ANGIOGRAPHY;  Surgeon: Leotis Pain  S, MD;  Location: [place] CV LAB;  Service: Cardiovascular;  Laterality: Left;  . PERIPHERAL ARTERIAL STENT GRAFT     x2 left/right  . PERIPHERAL VASCULAR BALLOON ANGIOPLASTY Right 01/06/2018   Procedure: PERIPHERAL VASCULAR BALLOON ANGIOPLASTY;  Surgeon: Katha Cabal, MD;  Location: [place] CV LAB;  Service: Cardiovascular;  Laterality: Right;  . PERIPHERAL VASCULAR CATHETERIZATION N/A 07/06/2015   Procedure: Abdominal Aortogram w/Lower Extremity;  Surgeon: Algernon Huxley, MD;  Location: [place] CV LAB;  Service: Cardiovascular;  Laterality: N/A;  . PERIPHERAL VASCULAR CATHETERIZATION  07/06/2015   Procedure: Lower Extremity Intervention;  Surgeon: Algernon Huxley, MD;  Location: [place] CV LAB;   Service: Cardiovascular;;    Medications Prior to Admission medications   Medication Sig Start Date End Date Taking? Authorizing Provider  albuterol (VENTOLIN HFA) 108 (90 Base) MCG/ACT inhaler Inhale into the lungs every 6 (six) hours as needed for wheezing or shortness of breath.    [provider]  aspirin EC 81 MG tablet Take 1 tablet (81 mg total) by mouth daily. 05/11/15   Wellington Hampshire, MD  atorvastatin (LIPITOR) 80 MG tablet Take 80 mg by mouth daily.    [provider]  calcium carbonate (TUMS - DOSED IN MG ELEMENTAL CALCIUM) 500 MG chewable tablet Chew 4 tablets by mouth daily as needed for indigestion or heartburn.    [provider]  clindamycin (CLEOCIN) 300 MG capsule Take by mouth. 05/24/19   [provider]  clopidogrel (PLAVIX) 75 MG tablet Take 75 mg by mouth daily.  08/22/17   [provider]  collagenase (SANTYL) ointment Apply 1 application topically daily. 08/11/19   Kris Hartmann, NP  diphenhydrAMINE (BENADRYL) 25 MG tablet Take 25 mg by mouth daily as needed for allergies.    [provider]  ENTRESTO 24-26 MG TAKE 1 TABLET BY MOUTH TWICE DAILY 06/19/19   Darylene Price A, FNP  ezetimibe (ZETIA) 10 MG tablet Take 1 tablet (10 mg total) by mouth daily. 05/19/19   Alisa Graff, FNP  gabapentin (NEURONTIN) 800 MG tablet Take 800 mg by mouth 4 (four) times daily.  08/14/17 07/30/19  [provider]  Insulin Glargine (LANTUS SOLOSTAR) 100 UNIT/ML Solostar Pen Inject 30 Units into the skin at bedtime. 04/07/18   Mayo, Pete Pelt, MD  insulin lispro (HUMALOG) 100 UNIT/ML injection Sliding Scale Insulin before meals using Novolog If BS is 0 to 70 no insulin and eat/glucose tablets/glucose get If BS 71 to 150 great! If BS 151 to 200, 2 Units Novolog If BS 201 to 250, 4 Units Novolog If BS 251 to 300, 6 Units Novolog If BS 301 to 350, 8 Units Novolog If BS 351 to 400, 10 Units Novolog If BS greater than 400, take 10 Units  and Contact our office during office hour 04/14/18   [provider]  ipratropium-albuterol (DUONEB) 0.5-2.5 (3) MG/3ML SOLN Inhale 3 mLs into the lungs every 6 (six) hours as needed (wheezing/sob).     [provider]  isosorbide mononitrate (IMDUR) 30 MG 24 hr tablet Take 1 tablet (30 mg total) by mouth daily. 05/19/19   Alisa Graff, FNP  metFORMIN (GLUCOPHAGE-XR) 500 MG 24 hr tablet Take 1,000 mg by mouth 2 (two) times daily.     [provider]  metoprolol succinate (TOPROL-XL) 25 MG 24 hr tablet Take 1 tablet (25 mg total) by mouth daily. 07/02/19 08/29/20  Loel Dubonnet, NP  Multiple Vitamin (MULTI-VITAMINS) TABS  Take 1 tablet by mouth daily.     [provider]  multivitamin-lutein (OCUVITE-LUTEIN) CAPS capsule Take 1 capsule by mouth daily. 01/09/18   Demetrios Loll, MD  naproxen (NAPROSYN) 500 MG tablet Take 1 tablet (500 mg total) by mouth 2 (two) times daily with a meal. 03/08/19   Lavonia Drafts, MD  nitroGLYCERIN (NITRODUR - DOSED IN MG/24 HR) 0.4 mg/hr patch Place 0.4 mg onto the skin daily.    [provider]  nitroGLYCERIN (NITROSTAT) 0.4 MG SL tablet Place 1 tablet (0.4 mg total) under the tongue every 5 (five) minutes as needed for chest pain. 05/19/19   Alisa Graff, FNP  Oxycodone HCl 10 MG TABS Take 10 mg by mouth every 6 (six) hours as needed (pain).  01/15/18   [provider]  potassium chloride (KLOR-CON) 10 MEQ tablet Take 1 tablet (10 mEq total) by mouth daily. Patient taking differently: Take 20 mEq by mouth daily. 62meq AM/ 44meq PM 05/19/19   Alisa Graff, FNP  promethazine (PHENERGAN) 25 MG tablet TAKE 1 TABLET BY MOUTH EVERY 6 HOURS AS NEEDED NAUSEA 04/29/18   [provider]  spironolactone (ALDACTONE) 25 MG tablet Take 25 mg by mouth daily.  03/02/18   [provider]  sulfamethoxazole-trimethoprim (BACTRIM DS) 800-160 MG tablet Take 1 tablet by mouth 2 (two) times daily. Patient not taking:  Reported on 08/11/2019 07/22/19   Kris Hartmann, NP  torsemide (DEMADEX) 20 MG tablet Take 40 mg by mouth daily. 40mg  AM/ 20mg  PM    [provider]  traZODone (DESYREL) 150 MG tablet Take 150 mg by mouth at bedtime. 07/12/19   [provider]  traZODone (DESYREL) 50 MG tablet Take 150 mg by mouth at bedtime as needed for sleep.  08/12/17   [provider]    Allergies Dulaglutide, Other, and Prednisone  Family Hx Family History  Problem Relation Age of Onset  . Heart attack Father   . Heart disease Father   . Hypertension Father   . Hyperlipidemia Father   . Hypertension Mother   . Hyperlipidemia Mother     Social Hx Social History   Tobacco Use  . Smoking status: Current Every Day Smoker    Packs/day: 1.00    Years: 41.00    Pack years: 41.00    Types: Cigarettes  . Smokeless tobacco: Never Used  Substance Use Topics  . Alcohol use: No  . Drug use: No     Review of Systems  Constitutional: Negative for fever, chills. Eyes: Negative for visual changes. ENT: Negative for sore throat. Cardiovascular: Negative for chest pain. Respiratory: + for shortness of breath. Gastrointestinal: Negative for nausea, vomiting.  Genitourinary: Negative for dysuria. Musculoskeletal: + for leg swelling. Skin: Negative for rash. Neurological: Negative for headaches.   Physical Exam  Vital Signs: ED Triage Vitals  Enc Vitals Group     BP 08/30/19 1508 121/67     Pulse Rate 08/30/19 1508 (!) 111     Resp 08/30/19 1508 18     Temp 08/30/19 1508 98.8 F (37.1 C)     Temp Source 08/30/19 1508 Oral     SpO2 08/30/19 1508 98 %     Weight 08/30/19 2300 207 lb 14.3 oz (94.3 kg)     Height --      Head Circumference --      Peak Flow --      Pain Score 08/30/19 1523 9     Pain Loc --  Pain Edu? --      Excl. in [place]? --     Constitutional: Alert and oriented. Well appearing.  Head: Normocephalic. Atraumatic. Eyes: Conjunctivae clear, sclera  anicteric. Pupils equal and symmetric. Nose: No masses or lesions. No congestion or rhinorrhea. Mouth/Throat: Wearing mask.  Neck: No stridor. Trachea midline.  Cardiovascular: Tachycardic, regular rhythm. Extremities well perfused. Respiratory: Normal respiratory effort. Coarse expiratory wheezing bilaterally. No hypoxia. No accessory muscle use. Gastrointestinal: Soft. Non-distended. Non-tender.  Genitourinary: Deferred. Musculoskeletal: Edema of LLE with well demarcated erythema. Scab with serous drainage to the anterior shin.  Neurologic:  Normal speech and language. No gross focal or lateralizing neurologic deficits are appreciated.  Skin: Edema of LLE with well demarcated erythema. Scab with serous drainage to the anterior shin.  Psychiatric: Mood and affect are appropriate for situation.  EKG  Personally reviewed and interpreted by myself.   Rate: 111 Rhythm: sinus Axis: LAD Intervals: QRS wide 2/2 RBBB TWI I, aVL, V1-V3 Abnormal but appears generally unchanged compared to prior No STEMI    Radiology  CXR  IMPRESSION:  1. Cardiomegaly. Small right and trace left pleural effusions.  2. Increased bronchial thickening from prior exam which may be  bronchitic or congestive.   [place] LLE:  IMPRESSION:  1. No evidence of left lower extremity deep venous thrombosis.  2. Subcutaneous edema at the calf.   CT PE:  IMPRESSION:  Moderate right pleural effusion.  No evidence of pulmonary embolus.  Cardiomegaly, diffuse coronary artery disease.  Aortic Atherosclerosis (ICD10-I70.0).   Procedures  Procedure(s) performed (including critical care):  Procedures   Initial Impression / Assessment and Plan / MDM / ED Course  59 y.o. male who presents to the ED for LLE swelling and SOB, as above.  Ddx: DVT with PE, HF exacerbation, COPD exacerbation, pulmonary infection  Will plan for labs, imaging, EKG. Nebulizer.   Wheezing improved after nebulizer. CT and [place] negative  for clot. Elevated BNP - suspect likely HF exacerbation based on labs and presentation. Mildly elevated troponin likely 2/2 demand. Will treat as HF exacerbation with dose of Lasix. Will also plan to treat LLE findings as cellulitis with antibiotics. Will admit for management and treatment. Patient agreeable.  _______________________________  As part of my medical decision making I have reviewed available labs, radiology tests, reviewed old records.   Final Clinical Impression(s) / ED Diagnosis  Final diagnoses:  Shortness of breath  Cellulitis of left lower extremity  COPD exacerbation ([place])       Note:  This document was prepared using Dragon voice recognition software and may include unintentional dictation errors.   Lilia Pro., MD 08/31/19 Berniece Salines

## 2019-08-30 NOTE — ED Triage Notes (Signed)
Pt here from home with c/o shob that began this am, concerned for pneumonia vs CHF, denies fever, states occasionally wheezing, also c/o left lower leg pain and swelling, currently being treated for cellulitis. NAD.

## 2019-08-30 NOTE — ED Provider Notes (Signed)
MSE was initiated and I personally evaluated the patient and placed orders (if any) at  3:24 PM on August 30, 2019.  The patient appears stable so that the remainder of the MSE may be completed by another provider.  S: Patient presents via personal vehicle from home. He c/o SOB, redness/swelling to the LLE.  He has home health who does weekly wound care for chronic diabetic foot wound. He noted onset of increasing SOB this morning. Home RN suggested evaluation  He denies fevers, chills, or sweats.   O: A&O CVS: RRR.  LungsLBilateral wheezes from bases to apices LLE: well-demarcated erythema, edema to lower leg. Midline shin scab noted. Minimal serous drainage noted.   A/P:  1. SOB, dyspnea 2. LLE cellulitis  Initial labs and imaging are pending. Final plan of care and disposition pending ER provider evaluation.    Melvenia Needles, PA-C 08/30/19 1615    Lilia Pro., MD 09/03/19 [phone]

## 2019-08-30 NOTE — ED Notes (Signed)
Meal tray provided.

## 2019-08-30 NOTE — Progress Notes (Signed)
Date:  08/30/2019   ID:  YOUSUF [name], DOB Jun 12, 1960, MRN [id number]  Patient Location:  [place] [id number]   Provider location:   [facility], [place] office  PCP:  Scot [place], [facility]  Cardiologist:  [facility]   Chief Complaint  Patient presents with  . OTHER    2 month f/u c/o going to ED yesterday got tired of waiting and left due to sepsis . Meds reviewed verbally with pt.    History of Present Illness:    [name] [name] is a 59 y.o. 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  He had a CTA chest that showed no evidence of PE but did show moderate right pleural effusion, cardiomegaly and diffuse coronary artery disease.  Patient was given ceftriaxone and vancomycin in the emergency room as well as IV Lasix and a few DuoNeb rounds.  Staff tried to get him to stay but he left AMA for unclear reasons  Typically moves around with a wheelchair.  Presents in wheelchair today  Takes torsemide 40 once a daily, unclear if he has been consistent with his torsemide, leg is swollen on the left  Seen by vascular for foot ulcer, reports he took bactrim started 08/11/19, But reports he has some left over  Feels that the ulcer is healing  Still smoking,1- 2 ppds, no  desire chantix does not work Girlfriend smokes  EKG personally reviewed by myself on todays visit Sinus tachycardia rate 104 bpm right bundle branch block left anterior fascicular block  Other past medical history reviewed Presentation to the hospital May 2020 acute on chronic systolic CHF Treated with Lasix IV, transition to torsemide Toprol XL and spironolactone   with continued -ACEi has previously been held secondary to relative hypotension  LE stent placed 08/2018 Percutaneous transluminal angioplasty of the entire left SFA and above-knee popliteal artery with a 5 mm diameter by 30 cm length and a 5 mm diameter by 22 cm length Lutonix drug-coated angioplasty balloon  Prior CV studies:   The following studies were reviewed today:  Cardiac cath: 04/2017 1.  Significant three-vessel coronary artery disease with patent stent in the RCA and LAD.  There is significant proximal RCA disease before the stent as well as diffuse mid and distal disease in a vessel that appears to be about 2.5 mm in diameter.  There is significant bifurcation stenosis in the proximal left circumflex with a large OM1.  The LAD has moderate disease. The coronary arteries are moderately calcified and diffusely diseased throughout. 2.  Left ventricular angiography was not performed.  EF was moderately to severely reduced by echo. 3.  Severely elevated left ventricular end-diastolic pressure at 34 mmHg.   2D Echo 11/06/2018: 1. The left ventricle has severely reduced  systolic function, with an ejection fraction of 20-25%. The cavity size was moderately dilated. Indeterminate diastolic filling due to E-A fusion. Left ventricular diffuse hypokinesis. 2. The right ventricle has severely reduced systolic function. The cavity was mildly enlarged. There is no increase in right ventricular wall thickness. Right ventricular systolic pressure is moderately elevated with an estimated pressure of 59.6 mmHg. 3. Left atrial size  was mildly dilated. 4. Right atrial size was mildly dilated. 5. The mitral valve is degenerative. Mild thickening of the mitral valve leaflet. Mild calcification of the mitral valve leaflet. There is mild mitral annular calcification present. 6. The tricuspid valve is degenerative. Tricuspid valve regurgitation is moderate. 7. The aortic valve was not well visualized. Moderate thickening of the aortic valve. Moderate calcification of the aortic valve. 8. The inferior vena cava was dilated in size with <50% respiratory variability.  Past Medical History:  Diagnosis Date  . Arthritis   . Asthma   . Atherosclerosis   . BPH (benign prostatic hyperplasia)   . Carotid arterial disease ([place])   . Charcot's joint of foot, right   . Chronic combined systolic (congestive) and diastolic (congestive) heart failure ([place])    a. 02/2013 EF 50% by LV gram; b. 04/2017 Echo: EF 25-30%. diff HK. Gr2 DD. Mod dil LA/RV. PASP 48mmHg.  [name] COPD (chronic obstructive pulmonary disease) ([name])   . Coronary artery disease    a. 2013 S/P PCI of LAD [facility]);  b. 03/2013 PCI: RCA 90p ( 2.5 x 23 mm DES); c. 02/2014 Cath: patent RCA stent->Med Rx; d. 04/2017 Cath: LM nl, LAd 30ost/p, 8m/d, D1 80ost, LCX 90p/m, OM1 85, RCA 70/20p, 36m, 70d->Referred for CT Surg-felt to be poor candidate.  . Diabetic neuropathy ([place])   . Diabetic ulcer of right foot ([place])   . Hernia   . Hyperlipidemia   . Hypertension   . Ischemic cardiomyopathy    a. 04/2017 Echo: EF 25-30%; b. 08/2017 Cardiac MRI ([facility]): EF 19%, sev glob HK. RVEF 20%, mod BAE, triv MR, mild-mod TR. Basal lateral subendocardial infarct (viable), inf/infsept, dist septal ischemia, basal to mid lat peri-infarct ischemia.  . Morbid obesity ([place])   . Neuropathy   . PAD (peripheral artery disease) ([place])    a. Followed by Dr. Lucky Cowboy; b. 08/2010 Periph Angio: RSFA 70-80p (6X60 self-expanding stent); c. 09/2010 Periph Angio: L SFA 100p (7x unknown length self-expanding stent); d.  06/2015 Periph Angio: R SFA short segment occlusion (6x12 self-expanding stent).  . Restless leg syndrome   . Sleep apnea   . Subclavian artery stenosis, right ([place])   . Syncope and collapse   . Tobacco use    a. 75+ yr hx - still smoking 1ppd, down from 2 ppd.  . Type II diabetes mellitus ([place])   . Varicose vein    Past Surgical History:  Procedure Laterality Date  . ABDOMINAL AORTAGRAM N/A 06/23/2013   Procedure: ABDOMINAL Maxcine Ham;  Surgeon: Wellington Hampshire, MD;  Location: [place] CATH LAB;  Service: Cardiovascular;  Laterality: N/A;  . AMPUTATION Right 04/01/2018   Procedure: AMPUTATION BELOW KNEE;  Surgeon: Algernon Huxley, MD;  Location: [facility] ORS;  Service: Vascular;  Laterality: Right;  . APPENDECTOMY    . CARDIAC CATHETERIZATION  10/14   [facility]; X1 STENT PROXIMAL RCA  . CARDIAC CATHETERIZATION  02/2010   [facility]  . CARDIAC CATHETERIZATION  02/21/2014   [facility]  . CLAVICLE SURGERY    . HERNIA REPAIR    . IRRIGATION AND DEBRIDEMENT FOOT Right  01/04/2018   Procedure: IRRIGATION AND DEBRIDEMENT FOOT;  Surgeon: Samara Deist, DPM;  Location: [facility] ORS;  Service: Podiatry;  Laterality: Right;  . LEFT HEART CATH AND CORS/GRAFTS ANGIOGRAPHY N/A 04/28/2017   Procedure: LEFT HEART CATH AND CORONARY ANGIOGRAPHY;  Surgeon: Wellington Hampshire, MD;  Location: [place] CV LAB;  Service: Cardiovascular;  Laterality: N/A;  . LOWER EXTREMITY ANGIOGRAPHY Right 03/03/2018   Procedure: LOWER EXTREMITY ANGIOGRAPHY;  Surgeon: Katha Cabal, MD;  Location: [place] CV LAB;  Service: Cardiovascular;  Laterality: Right;  . LOWER EXTREMITY ANGIOGRAPHY Left 08/24/2018   Procedure: LOWER EXTREMITY ANGIOGRAPHY;  Surgeon: Algernon Huxley, MD;  Location: [place] CV LAB;  Service: Cardiovascular;  Laterality: Left;  . LOWER EXTREMITY ANGIOGRAPHY Left 06/14/2019   Procedure: LOWER EXTREMITY ANGIOGRAPHY;  Surgeon: Algernon Huxley, MD;  Location: [place] CV LAB;  Service: Cardiovascular;  Laterality: Left;  .  LOWER EXTREMITY ANGIOGRAPHY Left 08/02/2019   Procedure: LOWER EXTREMITY ANGIOGRAPHY;  Surgeon: Algernon Huxley, MD;  Location: [place] CV LAB;  Service: Cardiovascular;  Laterality: Left;  . PERIPHERAL ARTERIAL STENT GRAFT     x2 left/right  . PERIPHERAL VASCULAR BALLOON ANGIOPLASTY Right 01/06/2018   Procedure: PERIPHERAL VASCULAR BALLOON ANGIOPLASTY;  Surgeon: Katha Cabal, MD;  Location: [place] CV LAB;  Service: Cardiovascular;  Laterality: Right;  . PERIPHERAL VASCULAR CATHETERIZATION N/A 07/06/2015   Procedure: Abdominal Aortogram w/Lower Extremity;  Surgeon: Algernon Huxley, MD;  Location: [place] CV LAB;  Service: Cardiovascular;  Laterality: N/A;  . PERIPHERAL VASCULAR CATHETERIZATION  07/06/2015   Procedure: Lower Extremity Intervention;  Surgeon: Algernon Huxley, MD;  Location: [place] CV LAB;  Service: Cardiovascular;;     No outpatient medications have been marked as taking for the 08/31/19 encounter (Appointment) with Minna Merritts, MD.     Allergies:   Dulaglutide, Other, and Prednisone   Social History   Tobacco Use  . Smoking status: Current Every Day Smoker    Packs/day: 1.00    Years: 41.00    Pack years: 41.00    Types: Cigarettes  . Smokeless tobacco: Never Used  Substance Use Topics  . Alcohol use: No  . Drug use: No     Current Facility-Administered Medications on File Prior to Visit  Medication Dose Route Frequency Provider Last Rate Last Admin  . iohexol (OMNIPAQUE) 350 MG/ML injection 75 mL  75 mL Intravenous Once PRN Lilia Pro., MD       Current Outpatient Medications on File Prior to Visit  Medication Sig Dispense Refill  . albuterol (VENTOLIN HFA) 108 (90 Base) MCG/ACT inhaler Inhale into the lungs every 6 (six) hours as needed for wheezing or shortness of breath.    [name] aspirin EC 81 MG tablet Take 1 tablet (81 mg total) by mouth daily. 90 tablet 3  . atorvastatin (LIPITOR) 80 MG tablet Take 80 mg by mouth daily.    . calcium  carbonate (TUMS - DOSED IN MG ELEMENTAL CALCIUM) 500 MG chewable tablet Chew 4 tablets by mouth daily as needed for indigestion or heartburn.    . clindamycin (CLEOCIN) 300 MG capsule Take by mouth.    . clopidogrel (PLAVIX) 75 MG tablet Take 75 mg by mouth daily.     . collagenase (SANTYL) ointment Apply 1 application topically daily. 90 g 1  . diphenhydrAMINE (BENADRYL) 25 MG tablet Take 25 mg by mouth daily as needed for allergies.    [name] ENTRESTO 24-26 MG TAKE 1 TABLET  BY MOUTH TWICE DAILY 60 tablet 3  . ezetimibe (ZETIA) 10 MG tablet Take 1 tablet (10 mg total) by mouth daily. 30 tablet 5  . gabapentin (NEURONTIN) 800 MG tablet Take 800 mg by mouth 4 (four) times daily.     . Insulin Glargine (LANTUS SOLOSTAR) 100 UNIT/ML Solostar Pen Inject 30 Units into the skin at bedtime. 15 mL 11  . insulin lispro (HUMALOG) 100 UNIT/ML injection Sliding Scale Insulin before meals using Novolog If BS is 0 to 70 no insulin and eat/glucose tablets/glucose get If BS 71 to 150 great! If BS 151 to 200, 2 Units Novolog If BS 201 to 250, 4 Units Novolog If BS 251 to 300, 6 Units Novolog If BS 301 to 350, 8 Units Novolog If BS 351 to 400, 10 Units Novolog If BS greater than 400, take 10 Units and Contact our office during office hour    . ipratropium-albuterol (DUONEB) 0.5-2.5 (3) MG/3ML SOLN Inhale 3 mLs into the lungs every 6 (six) hours as needed (wheezing/sob).     . isosorbide mononitrate (IMDUR) 30 MG 24 hr tablet Take 1 tablet (30 mg total) by mouth daily. 30 tablet 5  . metFORMIN (GLUCOPHAGE-XR) 500 MG 24 hr tablet Take 1,000 mg by mouth 2 (two) times daily.     . metoprolol succinate (TOPROL-XL) 25 MG 24 hr tablet Take 1 tablet (25 mg total) by mouth daily. 15 tablet 5  . Multiple Vitamin (MULTI-VITAMINS) TABS Take 1 tablet by mouth daily.     . multivitamin-lutein (OCUVITE-LUTEIN) CAPS capsule Take 1 capsule by mouth daily. 30 capsule 0  . naproxen (NAPROSYN) 500 MG tablet Take 1 tablet (500 mg total) by  mouth 2 (two) times daily with a meal. 20 tablet 2  . nitroGLYCERIN (NITRODUR - DOSED IN MG/24 HR) 0.4 mg/hr patch Place 0.4 mg onto the skin daily.    . nitroGLYCERIN (NITROSTAT) 0.4 MG SL tablet Place 1 tablet (0.4 mg total) under the tongue every 5 (five) minutes as needed for chest pain. 30 tablet 5  . Oxycodone HCl 10 MG TABS Take 10 mg by mouth every 6 (six) hours as needed (pain).     . potassium chloride (KLOR-CON) 10 MEQ tablet Take 1 tablet (10 mEq total) by mouth daily. (Patient taking differently: Take 20 mEq by mouth daily. 41meq AM/ 20meq PM) 30 tablet 5  . promethazine (PHENERGAN) 25 MG tablet TAKE 1 TABLET BY MOUTH EVERY 6 HOURS AS NEEDED NAUSEA    . spironolactone (ALDACTONE) 25 MG tablet Take 25 mg by mouth daily.     [name] sulfamethoxazole-trimethoprim (BACTRIM DS) 800-160 MG tablet Take 1 tablet by mouth 2 (two) times daily. (Patient not taking: Reported on 08/11/2019) 28 tablet 0  . torsemide (DEMADEX) 20 MG tablet Take 40 mg by mouth daily. 40mg  AM/ 20mg  PM    . traZODone (DESYREL) 150 MG tablet Take 150 mg by mouth at bedtime.    . traZODone (DESYREL) 50 MG tablet Take 150 mg by mouth at bedtime as needed for sleep.        Family Hx: The patient's family history includes Heart attack in his father; Heart disease in his father; Hyperlipidemia in his father and mother; Hypertension in his father and mother.  ROS:   Please see the history of present illness.    Review of Systems  Constitutional: Negative.   HENT: Negative.   Respiratory: Negative.   Cardiovascular: Negative.   Gastrointestinal: Negative.   Musculoskeletal: Negative.   Skin:  Left lower extremity ulceration, left left erythema  Neurological: Negative.   Psychiatric/Behavioral: Negative.   All other systems reviewed and are negative.    Labs/Other Tests and Data Reviewed:    Recent Labs: 06/02/2019: B Natriuretic Peptide 1,252.0 08/30/2019: BUN 9; Creatinine, Ser 0.83; Hemoglobin 11.8; Platelets  184; Potassium 4.0; Sodium 133   Recent Lipid Panel Lab Results  Component Value Date/Time   CHOL 80 11/09/2018 03:08 AM   CHOL 211 (H) 04/05/2013 05:20 AM   TRIG 42 11/09/2018 03:08 AM   TRIG 321 (H) 04/05/2013 05:20 AM   HDL 26 (L) 11/09/2018 03:08 AM   HDL 34 (L) 04/05/2013 05:20 AM   CHOLHDL 3.1 11/09/2018 03:08 AM   LDLCALC 46 11/09/2018 03:08 AM   LDLCALC 113 (H) 04/05/2013 05:20 AM    Wt Readings from Last 3 Encounters:  08/11/19 208 lb (94.3 kg)  08/02/19 203 lb (92.1 kg)  07/22/19 209 lb (94.8 kg)     Exam:    Vital Signs: Vital signs may also be detailed in the HPI BP 110/62 (BP Location: Right Arm, Patient Position: Sitting, Cuff Size: Normal)   Pulse (!) 104   Ht 5\' 7"  (1.702 m)   Wt 217 lb 2 oz (98.5 kg)   SpO2 98%   BMI 34.01 kg/m   Well nourished, well developed male in no acute distress. Constitutional:  oriented to person, place, and time. No distress.  Head: Normocephalic and atraumatic.  Eyes:  no discharge. No scleral icterus.  Neck: Normal range of motion. Neck supple.  Pulmonary/Chest: No audible wheezing, no distress, appears comfortable Musculoskeletal: Normal range of motion.  no  tenderness or deformity.  Neurological:   Coordination normal. Full exam not performed Skin:  Bright red erythema left LE Psychiatric:  normal mood and affect. behavior is normal. Thought content normal.    ASSESSMENT & PLAN:    Cellulitis Very erythematous, warm, tender Break in the skin noted Also with significant pitting edema left leg 2+ Left hospital AMA, details reviewed with him, hospital records reviewed Does not appear that he was discharged on oral antibiotics Recommend he start Keflex 500 mg p.o. 4 times daily for 10 days We will recommend he follow-up with primary care Also recommended extra torsemide 40 twice daily until leg edema improves then back down to 40 daily  Chronic systolic HF (heart failure) (HCC) - Recommend torsemide 40 twice daily  until leg edema improves then down to 40 daily Reports he has lots of medication but refill was provided Left hospital AMA, was given IV Lasix on admission for pleural effusion  Coronary artery disease of native artery of native heart with stable angina pectoris ([place]) - Previously declined CABG Currently with no symptoms of angina. No further workup at this time. Continue current medication regimen.  Type 2 diabetes mellitus with foot ulcer, with long-term current use of insulin (HCC)  Foot ulcer being treated by vascular last seen beginning of March 2021 was given Bactrim Reports it is healing well but not evaluated on today's visit Now with cellulitis, will treat with cephalexin 10 days  Atherosclerosis of artery of extremity with ulceration ([place]) -  Followed by vascular, prior stent left SFA Amputation on the right Still smoking, cessation recommended  Essential hypertension - Blood pressure stable, no signs of sepsis  PAD (peripheral artery disease) ([place]) -  Smoking cessation recommended Does not want Chantix  Mixed hyperlipidemia -  Last cholesterol at goal    Long discussion with him concerning hospital  mission, reason for leaving [place], treatment of his cellulitis and heart failure Disposition: Follow-up in 6 months   Signed, Ida Rogue, MD  08/30/2019 10:20 PM    [place] [facility] [address] [place] [address], [place], [place] [address]

## 2019-08-31 ENCOUNTER — Encounter: Payer: Self-pay | Admitting: Cardiovascular Disease

## 2019-08-31 ENCOUNTER — Ambulatory Visit (INDEPENDENT_AMBULATORY_CARE_PROVIDER_SITE_OTHER): Payer: Medicare Other | Admitting: Cardiovascular Disease

## 2019-08-31 VITALS — BP 110/62 | HR 104 | Ht 67.0 in | Wt 217.1 lb

## 2019-08-31 DIAGNOSIS — Z89511 Acquired absence of right leg below knee: Secondary | ICD-10-CM | POA: Diagnosis not present

## 2019-08-31 DIAGNOSIS — Z993 Dependence on wheelchair: Secondary | ICD-10-CM | POA: Diagnosis not present

## 2019-08-31 DIAGNOSIS — Z955 Presence of coronary angioplasty implant and graft: Secondary | ICD-10-CM | POA: Diagnosis not present

## 2019-08-31 DIAGNOSIS — I2510 Atherosclerotic heart disease of native coronary artery without angina pectoris: Secondary | ICD-10-CM | POA: Diagnosis present

## 2019-08-31 DIAGNOSIS — I5022 Chronic systolic (congestive) heart failure: Secondary | ICD-10-CM

## 2019-08-31 DIAGNOSIS — Z791 Long term (current) use of non-steroidal anti-inflammatories (NSAID): Secondary | ICD-10-CM | POA: Diagnosis not present

## 2019-08-31 DIAGNOSIS — E1140 Type 2 diabetes mellitus with diabetic neuropathy, unspecified: Secondary | ICD-10-CM | POA: Diagnosis present

## 2019-08-31 DIAGNOSIS — L03116 Cellulitis of left lower limb: Principal | ICD-10-CM

## 2019-08-31 DIAGNOSIS — Z7982 Long term (current) use of aspirin: Secondary | ICD-10-CM | POA: Diagnosis not present

## 2019-08-31 DIAGNOSIS — Z8249 Family history of ischemic heart disease and other diseases of the circulatory system: Secondary | ICD-10-CM | POA: Diagnosis not present

## 2019-08-31 DIAGNOSIS — I5023 Acute on chronic systolic (congestive) heart failure: Secondary | ICD-10-CM | POA: Diagnosis present

## 2019-08-31 DIAGNOSIS — E785 Hyperlipidemia, unspecified: Secondary | ICD-10-CM | POA: Diagnosis present

## 2019-08-31 DIAGNOSIS — I255 Ischemic cardiomyopathy: Secondary | ICD-10-CM | POA: Diagnosis present

## 2019-08-31 DIAGNOSIS — I10 Essential (primary) hypertension: Secondary | ICD-10-CM

## 2019-08-31 DIAGNOSIS — L97509 Non-pressure chronic ulcer of other part of unspecified foot with unspecified severity: Secondary | ICD-10-CM

## 2019-08-31 DIAGNOSIS — J449 Chronic obstructive pulmonary disease, unspecified: Secondary | ICD-10-CM | POA: Diagnosis present

## 2019-08-31 DIAGNOSIS — I739 Peripheral vascular disease, unspecified: Secondary | ICD-10-CM | POA: Diagnosis not present

## 2019-08-31 DIAGNOSIS — I890 Lymphedema, not elsewhere classified: Secondary | ICD-10-CM

## 2019-08-31 DIAGNOSIS — E11621 Type 2 diabetes mellitus with foot ulcer: Secondary | ICD-10-CM

## 2019-08-31 DIAGNOSIS — Z792 Long term (current) use of antibiotics: Secondary | ICD-10-CM | POA: Diagnosis not present

## 2019-08-31 DIAGNOSIS — Z794 Long term (current) use of insulin: Secondary | ICD-10-CM

## 2019-08-31 DIAGNOSIS — F17210 Nicotine dependence, cigarettes, uncomplicated: Secondary | ICD-10-CM | POA: Diagnosis present

## 2019-08-31 DIAGNOSIS — I110 Hypertensive heart disease with heart failure: Secondary | ICD-10-CM | POA: Diagnosis present

## 2019-08-31 DIAGNOSIS — I25118 Atherosclerotic heart disease of native coronary artery with other forms of angina pectoris: Secondary | ICD-10-CM

## 2019-08-31 DIAGNOSIS — Z5329 Procedure and treatment not carried out because of patient's decision for other reasons: Secondary | ICD-10-CM | POA: Diagnosis not present

## 2019-08-31 DIAGNOSIS — Z8349 Family history of other endocrine, nutritional and metabolic diseases: Secondary | ICD-10-CM | POA: Diagnosis not present

## 2019-08-31 DIAGNOSIS — E1151 Type 2 diabetes mellitus with diabetic peripheral angiopathy without gangrene: Secondary | ICD-10-CM | POA: Diagnosis present

## 2019-08-31 DIAGNOSIS — I252 Old myocardial infarction: Secondary | ICD-10-CM | POA: Diagnosis not present

## 2019-08-31 DIAGNOSIS — Z79899 Other long term (current) drug therapy: Secondary | ICD-10-CM | POA: Diagnosis not present

## 2019-08-31 DIAGNOSIS — Z720 Tobacco use: Secondary | ICD-10-CM

## 2019-08-31 DIAGNOSIS — I700 Atherosclerosis of aorta: Secondary | ICD-10-CM | POA: Diagnosis present

## 2019-08-31 DIAGNOSIS — Z7902 Long term (current) use of antithrombotics/antiplatelets: Secondary | ICD-10-CM | POA: Diagnosis not present

## 2019-08-31 LAB — URINALYSIS, COMPLETE (UACMP) WITH MICROSCOPIC
Bacteria, UA: NONE SEEN
Bilirubin Urine: NEGATIVE
Glucose, UA: NEGATIVE mg/dL
Ketones, ur: NEGATIVE mg/dL
Leukocytes,Ua: NEGATIVE
Nitrite: NEGATIVE
Protein, ur: 30 mg/dL — AB
Specific Gravity, Urine: 1.01 (ref 1.005–1.030)
Squamous Epithelial / HPF: NONE SEEN (ref 0–5)
pH: 7 (ref 5.0–8.0)

## 2019-08-31 MED ORDER — CEFAZOLIN SODIUM-DEXTROSE 1-4 GM/50ML-% IV SOLN
1.0000 g | Freq: Three times a day (TID) | INTRAVENOUS | Status: DC
  Filled 2019-08-31 (×2): qty 50

## 2019-08-31 MED ORDER — FUROSEMIDE 10 MG/ML IJ SOLN: 20.0000 mg | Freq: Two times a day (BID) | INTRAMUSCULAR | Status: DC

## 2019-08-31 MED ORDER — ENOXAPARIN SODIUM 40 MG/0.4ML ~~LOC~~ SOLN: 40.0000 mg | SUBCUTANEOUS | Status: DC

## 2019-08-31 MED ORDER — INSULIN ASPART 100 UNIT/ML ~~LOC~~ SOLN: 0.0000 [IU] | Freq: Every day | SUBCUTANEOUS | Status: DC

## 2019-08-31 MED ORDER — INSULIN ASPART 100 UNIT/ML ~~LOC~~ SOLN: 0.0000 [IU] | Freq: Three times a day (TID) | SUBCUTANEOUS | Status: DC

## 2019-08-31 MED ORDER — CEPHALEXIN 500 MG PO CAPS: 500.0000 mg | ORAL_CAPSULE | Freq: Four times a day (QID) | ORAL | 0 refills | Status: DC

## 2019-08-31 MED ORDER — TORSEMIDE 20 MG PO TABS: 40.0000 mg | ORAL_TABLET | Freq: Two times a day (BID) | ORAL | 3 refills | Status: DC

## 2019-08-31 MED ORDER — VANCOMYCIN HCL 1250 MG/250ML IV SOLN
1250.0000 mg | Freq: Two times a day (BID) | INTRAVENOUS | Status: DC
  Filled 2019-08-31: qty 250

## 2019-08-31 NOTE — Patient Instructions (Addendum)
Medication Instructions:  Keflex 500 mg four times a day, 10 days  Torsemide 40 mg twice a day until the left swelling goes down Then down to torsemide 40 daily  If you need a refill on your cardiac medications before your next appointment, please call your pharmacy.    Lab work: No new labs needed   If you have labs (blood work) drawn today and your tests are completely normal, you will receive your results only by: [name] MyChart Message (if you have MyChart) OR . A paper copy in the mail If you have any lab test that is abnormal or we need to change your treatment, we will call you to review the results.   Testing/Procedures: No new testing needed   Follow-Up: At [facility], you and your health needs are our priority.  As part of our continuing mission to provide you with exceptional heart care, we have created designated Provider Care Teams.  These Care Teams include your primary Cardiologist (physician) and Advanced Practice Providers (APPs -  Physician Assistants and Nurse Practitioners) who all work together to provide you with the care you need, when you need it.  . You will need a follow up appointment in 6 months .  [name] Providers on your designated Care Team:   . Murray Hodgkins, NP . Christell Faith, PA-C . Marrianne Mood, PA-C  Any Other Special Instructions Will Be Listed Below (If Applicable).  For educational health videos Log in to : [email or web address] Or : [email or web address], password : [email or web address]

## 2019-08-31 NOTE — ED Notes (Signed)
Pt has pulled all cords off and has moved to the bench. Pt encouraged to stay until wife comes and to finish his antibiotics. Pt agrees.

## 2019-08-31 NOTE — ED Notes (Signed)
Pts wife in room and explained risk to patient and wife. Patient still refusing to stay and sts, "I will call my doctor in the morning." Pt signed AMA form as well as Damita Dunnings, MD. Pt wheeled to car by this RN.

## 2019-08-31 NOTE — Discharge Summary (Signed)
AMA Discharge  Shortly after decision to admit patient, he decided to leave AMA.  Both nurse and myself spoke to him extensively about the risks in leaving AMA including worsening of his condition and death.  He said he would rather see his PCP in the a.m.  He proceeded to leave.  Please refer to H&P dictated a few minutes prior for details but essentially [name] [name] is a 59 y.o. male with medical history significant for CAD, systolic heart failure, PAD status post right BKA, DM on insulin who presents to the emergency room with pain swelling and redness of the left lower extremity starting about 3 days prior.  He was admitted for cellulitis and mild exacerbation of CHF.

## 2019-08-31 NOTE — H&P (Signed)
History and Physical    [name] [name] XKG:[id number] DOB: June 13, 1960 DOA: 08/30/2019  PCP: [facility]   Patient coming from: Home  I have personally briefly reviewed patient's old medical records in [place]  Chief Complaint: Shortness of breath and pain and redness left lower extremity  HPI: [name] [name] is a 59 y.o. male with medical history significant for CAD, systolic heart failure, PAD status post right BKA, DM on insulin who presents to the emergency room with pain swelling and redness of the left lower extremity starting about 3 days prior.  He said a skin break up.  On the anterior shin and has been draining.  He denies insect bites or other injury.  Also reported some shortness of breath starting on the day of arrival.  He has no chest pain.  At baseline gets around with wheelchair.  He denies fever or chills  ED Course: Level in the emergency room he was tachycardic at 111 with otherwise normal vitals.  He had a venous ABG that showed elevated PCO2 of 66 but with normal pH.  Troponin was 44>>55 with a BNP 2054.  Blood work for the most part otherwise unremarkable she showed no acute ST-T wave changes.  Her extremity Doppler negative for DVT.  He had a CTA chest that showed no evidence of PE but did show moderate right pleural effusion, cardiomegaly and diffuse coronary artery disease.  Patient was given ceftriaxone and vancomycin in the emergency room as well as IV Lasix and a few DuoNeb rounds.  Hospitalist consulted for admission  Review of Systems: As per HPI otherwise 10 point review of systems negative.    Past Medical History:  Diagnosis Date  . Arthritis   . Asthma   . Atherosclerosis   . BPH (benign prostatic hyperplasia)   . Carotid arterial disease ([place])   . Charcot's joint of foot, right   . Chronic combined systolic (congestive) and diastolic (congestive) heart failure ([place])    a. 02/2013 EF 50% by LV gram; b. 04/2017 Echo: EF 25-30%.  diff HK. Gr2 DD. Mod dil LA/RV. PASP 80mmHg.  [name] COPD (chronic obstructive pulmonary disease) ([place])   . Coronary artery disease    a. 2013 S/P PCI of LAD [facility]);  b. 03/2013 PCI: RCA 90p ( 2.5 x 23 mm DES); c. 02/2014 Cath: patent RCA stent->Med Rx; d. 04/2017 Cath: LM nl, LAd 30ost/p, 27m/d, D1 80ost, LCX 90p/m, OM1 85, RCA 70/20p, 75m, 70d->Referred for CT Surg-felt to be poor candidate.  . Diabetic neuropathy ([place])   . Diabetic ulcer of right foot ([place])   . Hernia   . Hyperlipidemia   . Hypertension   . Ischemic cardiomyopathy    a. 04/2017 Echo: EF 25-30%; b. 08/2017 Cardiac MRI ([facility]): EF 19%, sev glob HK. RVEF 20%, mod BAE, triv MR, mild-mod TR. Basal lateral subendocardial infarct (viable), inf/infsept, dist septal ischemia, basal to mid lat peri-infarct ischemia.  . Morbid obesity ([place])   . Neuropathy   . PAD (peripheral artery disease) ([place])    a. Followed by Dr. Lucky Cowboy; b. 08/2010 Periph Angio: RSFA 70-80p (6X60 self-expanding stent); c. 09/2010 Periph Angio: L SFA 100p (7x unknown length self-expanding stent); d. 06/2015 Periph Angio: R SFA short segment occlusion (6x12 self-expanding stent).  . Restless leg syndrome   . Sleep apnea   . Subclavian artery stenosis, right ([place])   . Syncope and collapse   . Tobacco use    a. 75+ yr hx -  still smoking 1ppd, down from 2 ppd.  . Type II diabetes mellitus ([place])   . Varicose vein     Past Surgical History:  Procedure Laterality Date  . ABDOMINAL AORTAGRAM N/A 06/23/2013   Procedure: ABDOMINAL Maxcine Ham;  Surgeon: Wellington Hampshire, MD;  Location: [place] CATH LAB;  Service: Cardiovascular;  Laterality: N/A;  . AMPUTATION Right 04/01/2018   Procedure: AMPUTATION BELOW KNEE;  Surgeon: Algernon Huxley, MD;  Location: [facility] ORS;  Service: Vascular;  Laterality: Right;  . APPENDECTOMY    . CARDIAC CATHETERIZATION  10/14   [facility]; X1 STENT PROXIMAL RCA  . CARDIAC CATHETERIZATION  02/2010   [facility]  . CARDIAC CATHETERIZATION  02/21/2014   [facility]  . CLAVICLE  SURGERY    . HERNIA REPAIR    . IRRIGATION AND DEBRIDEMENT FOOT Right 01/04/2018   Procedure: IRRIGATION AND DEBRIDEMENT FOOT;  Surgeon: Samara Deist, DPM;  Location: [facility] ORS;  Service: Podiatry;  Laterality: Right;  . LEFT HEART CATH AND CORS/GRAFTS ANGIOGRAPHY N/A 04/28/2017   Procedure: LEFT HEART CATH AND CORONARY ANGIOGRAPHY;  Surgeon: Wellington Hampshire, MD;  Location: [place] CV LAB;  Service: Cardiovascular;  Laterality: N/A;  . LOWER EXTREMITY ANGIOGRAPHY Right 03/03/2018   Procedure: LOWER EXTREMITY ANGIOGRAPHY;  Surgeon: Katha Cabal, MD;  Location: [place] CV LAB;  Service: Cardiovascular;  Laterality: Right;  . LOWER EXTREMITY ANGIOGRAPHY Left 08/24/2018   Procedure: LOWER EXTREMITY ANGIOGRAPHY;  Surgeon: Algernon Huxley, MD;  Location: [employer] CV LAB;  Service: Cardiovascular;  Laterality: Left;  . LOWER EXTREMITY ANGIOGRAPHY Left 06/14/2019   Procedure: LOWER EXTREMITY ANGIOGRAPHY;  Surgeon: Algernon Huxley, MD;  Location: [place] CV LAB;  Service: Cardiovascular;  Laterality: Left;  . LOWER EXTREMITY ANGIOGRAPHY Left 08/02/2019   Procedure: LOWER EXTREMITY ANGIOGRAPHY;  Surgeon: Algernon Huxley, MD;  Location: [place] CV LAB;  Service: Cardiovascular;  Laterality: Left;  . PERIPHERAL ARTERIAL STENT GRAFT     x2 left/right  . PERIPHERAL VASCULAR BALLOON ANGIOPLASTY Right 01/06/2018   Procedure: PERIPHERAL VASCULAR BALLOON ANGIOPLASTY;  Surgeon: Katha Cabal, MD;  Location: [place] CV LAB;  Service: Cardiovascular;  Laterality: Right;  . PERIPHERAL VASCULAR CATHETERIZATION N/A 07/06/2015   Procedure: Abdominal Aortogram w/Lower Extremity;  Surgeon: Algernon Huxley, MD;  Location: [place] CV LAB;  Service: Cardiovascular;  Laterality: N/A;  . PERIPHERAL VASCULAR CATHETERIZATION  07/06/2015   Procedure: Lower Extremity Intervention;  Surgeon: Algernon Huxley, MD;  Location: [place] CV LAB;  Service: Cardiovascular;;     reports that he has been  smoking cigarettes. He has a 41.00 pack-year smoking history. He has never used smokeless tobacco. He reports that he does not drink alcohol or use drugs.  Allergies  Allergen Reactions  . Dulaglutide Anaphylaxis, Diarrhea and Hives  . Other Itching    Skin itching associated with nitro patch  . Prednisone Rash    Family History  Problem Relation Age of Onset  . Heart attack Father   . Heart disease Father   . Hypertension Father   . Hyperlipidemia Father   . Hypertension Mother   . Hyperlipidemia Mother      Prior to Admission medications   Medication Sig Start Date End Date Taking? Authorizing Provider  Insulin Glargine (LANTUS SOLOSTAR) 100 UNIT/ML Solostar Pen Inject 30 Units into the skin at bedtime. Patient taking differently: Inject 20-40 Units into the skin 2 (two) times daily. 40 units in the morning and 20 units at night 04/07/18  Yes  Mayo, Pete Pelt, MD  albuterol (VENTOLIN HFA) 108 (90 Base) MCG/ACT inhaler Inhale into the lungs every 6 (six) hours as needed for wheezing or shortness of breath.    [provider]  aspirin EC 81 MG tablet Take 1 tablet (81 mg total) by mouth daily. 05/11/15   Wellington Hampshire, MD  atorvastatin (LIPITOR) 80 MG tablet Take 80 mg by mouth daily.    [provider]  calcium carbonate (TUMS - DOSED IN MG ELEMENTAL CALCIUM) 500 MG chewable tablet Chew 4 tablets by mouth daily as needed for indigestion or heartburn.    [provider]  clindamycin (CLEOCIN) 300 MG capsule Take by mouth. 05/24/19   [provider]  clopidogrel (PLAVIX) 75 MG tablet Take 75 mg by mouth daily.  08/22/17   [provider]  collagenase (SANTYL) ointment Apply 1 application topically daily. 08/11/19   Kris Hartmann, NP  diphenhydrAMINE (BENADRYL) 25 MG tablet Take 25 mg by mouth daily as needed for allergies.    [provider]  ENTRESTO 24-26 MG TAKE 1 TABLET BY MOUTH TWICE DAILY 06/19/19   Darylene Price A, FNP   ezetimibe (ZETIA) 10 MG tablet Take 1 tablet (10 mg total) by mouth daily. 05/19/19   Alisa Graff, FNP  gabapentin (NEURONTIN) 800 MG tablet Take 800 mg by mouth 4 (four) times daily.  08/14/17 07/30/19  [provider]  insulin lispro (HUMALOG) 100 UNIT/ML injection Sliding Scale Insulin before meals using Novolog If BS is 0 to 70 no insulin and eat/glucose tablets/glucose get If BS 71 to 150 great! If BS 151 to 200, 2 Units Novolog If BS 201 to 250, 4 Units Novolog If BS 251 to 300, 6 Units Novolog If BS 301 to 350, 8 Units Novolog If BS 351 to 400, 10 Units Novolog If BS greater than 400, take 10 Units and Contact our office during office hour 04/14/18   [provider]  ipratropium-albuterol (DUONEB) 0.5-2.5 (3) MG/3ML SOLN Inhale 3 mLs into the lungs every 6 (six) hours as needed (wheezing/sob).     [provider]  isosorbide mononitrate (IMDUR) 30 MG 24 hr tablet Take 1 tablet (30 mg total) by mouth daily. 05/19/19   Alisa Graff, FNP  metFORMIN (GLUCOPHAGE-XR) 500 MG 24 hr tablet Take 1,000 mg by mouth 2 (two) times daily.     [provider]  metoprolol succinate (TOPROL-XL) 25 MG 24 hr tablet Take 1 tablet (25 mg total) by mouth daily. 07/02/19 08/29/20  Loel Dubonnet, NP  Multiple Vitamin (MULTI-VITAMINS) TABS Take 1 tablet by mouth daily.     [provider]  multivitamin-lutein (OCUVITE-LUTEIN) CAPS capsule Take 1 capsule by mouth daily. 01/09/18   Demetrios Loll, MD  naproxen (NAPROSYN) 500 MG tablet Take 1 tablet (500 mg total) by mouth 2 (two) times daily with a meal. 03/08/19   Lavonia Drafts, MD  nitroGLYCERIN (NITRODUR - DOSED IN MG/24 HR) 0.4 mg/hr patch Place 0.4 mg onto the skin daily.    [provider]  nitroGLYCERIN (NITROSTAT) 0.4 MG SL tablet Place 1 tablet (0.4 mg total) under the tongue every 5 (five) minutes as needed for chest pain. 05/19/19   Alisa Graff, FNP  Oxycodone HCl 10 MG TABS Take 10 mg by mouth every 6 (six)  hours as needed (pain).  01/15/18   [provider]  potassium chloride (KLOR-CON) 10 MEQ tablet Take 1 tablet (10 mEq total) by mouth daily. Patient taking differently: Take  20 mEq by mouth daily. 62meq AM/ 22meq PM 05/19/19   Alisa Graff, FNP  promethazine (PHENERGAN) 25 MG tablet TAKE 1 TABLET BY MOUTH EVERY 6 HOURS AS NEEDED NAUSEA 04/29/18   [provider]  spironolactone (ALDACTONE) 25 MG tablet Take 25 mg by mouth daily.  03/02/18   [provider]  sulfamethoxazole-trimethoprim (BACTRIM DS) 800-160 MG tablet Take 1 tablet by mouth 2 (two) times daily. Patient not taking: Reported on 08/11/2019 07/22/19   Kris Hartmann, NP  torsemide (DEMADEX) 20 MG tablet Take 40 mg by mouth daily. 40mg  AM/ 20mg  PM    [provider]  traZODone (DESYREL) 150 MG tablet Take 150 mg by mouth at bedtime. 07/12/19   [provider]  traZODone (DESYREL) 50 MG tablet Take 150 mg by mouth at bedtime as needed for sleep.  08/12/17   [provider]    Physical Exam: Vitals:   08/30/19 2225 08/30/19 2300 08/31/19 0030 08/31/19 0040  BP: 130/80  102/74 130/88  Pulse: (!) 109  (!) 105 (!) 110  Resp: (!) 26  18 (!) 24  Temp:      TempSrc:      SpO2: 94%  91% (!) 89%  Weight:  94.3 kg       Vitals:   08/30/19 2225 08/30/19 2300 08/31/19 0030 08/31/19 0040  BP: 130/80  102/74 130/88  Pulse: (!) 109  (!) 105 (!) 110  Resp: (!) 26  18 (!) 24  Temp:      TempSrc:      SpO2: 94%  91% (!) 89%  Weight:  94.3 kg      Constitutional: Alert and awake, oriented x3, not in any acute distress. Eyes: PERLA, EOMI, irises appear normal, anicteric sclera,  ENMT: external ears and nose appear normal, normal hearing             Lips appears normal, oropharynx mucosa, tongue, posterior pharynx appear normal  Neck: neck appears normal, no masses, normal ROM, no thyromegaly, no JVD  CVS: S1-S2 clear, no murmur rubs or gallops,  , no carotid bruits, pedal pulses palpable,  swelling left lower extremity Respiratory:  clear to auscultation bilaterally, no wheezing, rales or rhonchi. Respiratory effort normal. No accessory muscle use.  Abdomen: soft nontender, nondistended, normal bowel sounds, no hepatosplenomegaly, no hernias Musculoskeletal: : no cyanosis, clubbing , right BKA left lower extremity, shallow ulcer mid left shin with redness extending from ankle to 5 cm below knee with redness, tenderness, warmth neuro: Cranial nerves II-XII intact, sensation, reflexes normal, strength Psych: judgement and insight appear normal, stable mood and affect,  Skin: shallow ulcer mid left shin with redness extending from ankle to 5 cm below knee with redness, tenderness, warmth   Labs on Admission: I have personally reviewed following labs and imaging studies  CBC: Recent Labs  Lab 08/30/19 1515  WBC 5.9  NEUTROABS 4.4  HGB 11.8*  HCT 38.1*  MCV 73.0*  PLT 716   Basic Metabolic Panel: Recent Labs  Lab 08/30/19 1515  NA 133*  K 4.0  CL 93*  CO2 31  GLUCOSE 187*  BUN 9  CREATININE 0.83  CALCIUM 8.7*   GFR: Estimated Creatinine Clearance: 107.5 mL/min (by C-G formula based on SCr of 0.83 mg/dL). Liver Function Tests: No results for input(s): AST, ALT, ALKPHOS, BILITOT, PROT, ALBUMIN in the last 168 hours. No results for input(s): LIPASE, AMYLASE in the last 168 hours. No results for input(s): AMMONIA in the last 168  hours. Coagulation Profile: No results for input(s): INR, PROTIME in the last 168 hours. Cardiac Enzymes: No results for input(s): CKTOTAL, CKMB, CKMBINDEX, TROPONINI in the last 168 hours. BNP (last 3 results) No results for input(s): PROBNP in the last 8760 hours. HbA1C: No results for input(s): HGBA1C in the last 72 hours. CBG: No results for input(s): GLUCAP in the last 168 hours. Lipid Profile: No results for input(s): CHOL, HDL, LDLCALC, TRIG, CHOLHDL, LDLDIRECT in the last 72 hours. Thyroid Function Tests: No results for  input(s): TSH, T4TOTAL, FREET4, T3FREE, THYROIDAB in the last 72 hours. Anemia Panel: No results for input(s): VITAMINB12, FOLATE, FERRITIN, TIBC, IRON, RETICCTPCT in the last 72 hours. Urine analysis:    Component Value Date/Time   COLORURINE STRAW (A) 08/31/2019 0015   APPEARANCEUR CLEAR (A) 08/31/2019 0015   APPEARANCEUR Clear 02/06/2014 1412   LABSPEC 1.010 08/31/2019 0015   LABSPEC 1.015 02/06/2014 1412   PHURINE 7.0 08/31/2019 0015   GLUCOSEU NEGATIVE 08/31/2019 0015   GLUCOSEU Negative 02/06/2014 1412   HGBUR SMALL (A) 08/31/2019 0015   BILIRUBINUR NEGATIVE 08/31/2019 0015   BILIRUBINUR Negative 02/06/2014 1412   KETONESUR NEGATIVE 08/31/2019 0015   PROTEINUR 30 (A) 08/31/2019 0015   NITRITE NEGATIVE 08/31/2019 0015   LEUKOCYTESUR NEGATIVE 08/31/2019 0015   LEUKOCYTESUR Negative 02/06/2014 1412    Radiological Exams on Admission: DG Chest 2 View  Result Date: 08/30/2019 CLINICAL DATA:  Shortness of breath. COPD. EXAM: CHEST - 2 VIEW COMPARISON:  Radiograph 11/05/2018 FINDINGS: Cardiomegaly, not significantly changed. Unchanged mediastinal contours with aortic atherosclerosis. Increased peribronchial thickening from prior. Small right and trace left pleural effusion. There is fluid in the fissures. No confluent airspace disease. No pneumothorax. IMPRESSION: 1. Cardiomegaly.  Small right and trace left pleural effusions. 2. Increased bronchial thickening from prior exam which may be bronchitic or congestive. Aortic Atherosclerosis (ICD10-I70.0). Electronically Signed   By: Keith Rake M.D.   On: 08/30/2019 16:03   CT Angio Chest PE W and/or Wo Contrast  Result Date: 08/30/2019 CLINICAL DATA:  Shortness of breath EXAM: CT ANGIOGRAPHY CHEST WITH CONTRAST TECHNIQUE: Multidetector CT imaging of the chest was performed using the standard protocol during bolus administration of intravenous contrast. Multiplanar CT image reconstructions and MIPs were obtained to evaluate the  vascular anatomy. CONTRAST:  39mL OMNIPAQUE IOHEXOL 350 MG/ML SOLN COMPARISON:  12/22/2017 FINDINGS: Cardiovascular: No filling defects in the pulmonary arteries to suggest pulmonary emboli. Heart is enlarged. Densely calcified coronary arteries diffusely. Aortic atherosclerosis. No aneurysm. Mediastinum/Nodes: Borderline sized mediastinal lymph nodes. Right paratracheal lymph node has a short axis diameter of 9 mm. No axillary or hilar adenopathy. Lungs/Pleura: Moderate right pleural effusion. No confluent airspace opacities. Upper Abdomen: Imaging into the upper abdomen shows no acute findings. Musculoskeletal: Chest wall soft tissues are unremarkable. No acute bony abnormality. Review of the MIP images confirms the above findings. IMPRESSION: Moderate right pleural effusion. No evidence of pulmonary embolus. Cardiomegaly, diffuse coronary artery disease. Aortic Atherosclerosis (ICD10-I70.0). Electronically Signed   By: Rolm Baptise M.D.   On: 08/30/2019 22:44   US Venous Img Lower  Left (DVT Study)  Result Date: 08/30/2019 CLINICAL DATA:  59 year old male with left leg pain swelling and redness. Being treated for cellulitis. Shortness of breath. EXAM: LEFT LOWER EXTREMITY VENOUS DOPPLER ULTRASOUND TECHNIQUE: Gray-scale sonography with compression, as well as color and duplex ultrasound, were performed to evaluate the deep venous system(s) from the level of the common femoral vein through the popliteal and proximal calf veins. COMPARISON:  Bilateral  lower extremity venous Doppler ultrasound 05/06/2016. FINDINGS: VENOUS Normal compressibility of the common femoral, superficial femoral, and popliteal veins, as well as the visualized calf veins. Visualized portions of profunda femoral vein are unremarkable. No filling defects to suggest DVT on grayscale or color Doppler imaging. Doppler waveforms show normal direction of venous flow, normal respiratory phasicity and response to augmentation. Limited views of the  contralateral common femoral vein are unremarkable. OTHER Evidence of subcutaneous edema at the calf (image 31). Limitations: none IMPRESSION: 1. No evidence of left lower extremity deep venous thrombosis. 2. Subcutaneous edema at the calf. Electronically Signed   By: Genevie Ann M.D.   On: 08/30/2019 22:32    EKG: Independently reviewed.   Assessment/Plan Principal Problem:   Cellulitis of left leg -IV Ancef -Keep leg elevated    PAD (peripheral artery disease) (HCC) status post right BKA -No acute concerns -Continue aspirin and statins    Type 2 diabetes mellitus with complication, with long-term current use of insulin (HCC) -Regular insulin sliding scale coverage    COPD (chronic obstructive pulmonary disease) (HCC) -Not acutely exacerbated -DuoNebs as needed and continue home inhalers  Mild acute on chronic systolic CHF (congestive heart failure) (HCC) -CTA chest showed moderate pleural effusion, BNP elevated above 2000 -IV Lasix 20 mg twice daily -Continue home Entresto, metoprolol, spironolactone -Daily weights, intake and output monitoring -Echocardiogram in the a.m.    CAD (coronary artery disease) -No complaints of chest pain and EKG nonacute -Continue nitroglycerin patch, Imdur, aspirin, Plavix, metoprolol    DVT prophylaxis: Lovenox  Code Status: full code  Family Communication:  none  Disposition Plan: Back to previous home environment Consults called: none  Status:inp    Athena Masse MD [facility]     08/31/2019, 2:13 AM

## 2019-08-31 NOTE — ED Notes (Signed)
Pt yelling at this RN and sts, "Im not staying here. Call my wife and tell her to come get me, now!. I would rather lay at home and die then lay here and listen to all these monitors go off." Pt educated on his medical condition and the importance of the O2 monitor due to his lack in oxygen and the importance of the heart monitor due to his heart block. Wife contacted and explained in front of patient the risk and request made by the patient. Wife in route to ED to intervene. MD Damita Dunnings contacted.

## 2019-08-31 NOTE — Progress Notes (Signed)
Pharmacy Antibiotic Note  PARNELL SPIELER is a 59 y.o. male admitted on 08/30/2019 with sepsis.  Pharmacy has been consulted for vancomycin dosing.  Plan: Vancomycin 1250 mg IV Q 12 hrs. Goal AUC 400-550. Expected AUC: 456.7 SCr used: 0.83 Cssmin: 12.7  Weight: 207 lb 14.3 oz (94.3 kg)  Temp (24hrs), Avg:98.8 F (37.1 C), Min:98.8 F (37.1 C), Max:98.8 F (37.1 C)  Recent Labs  Lab 08/30/19 1515  WBC 5.9  CREATININE 0.83  LATICACIDVEN 1.4    Estimated Creatinine Clearance: 107.5 mL/min (by C-G formula based on SCr of 0.83 mg/dL).    Allergies  Allergen Reactions  . Dulaglutide Anaphylaxis, Diarrhea and Hives  . Other Itching    Skin itching associated with nitro patch  . Prednisone Rash   Thank you for allowing pharmacy to be a part of this patient's care.  Tobie Lords, PharmD, BCPS Clinical Pharmacist 08/31/2019 1:48 AM

## 2019-09-06 ENCOUNTER — Encounter: Payer: Self-pay | Admitting: Family

## 2019-09-06 ENCOUNTER — Other Ambulatory Visit: Payer: Self-pay

## 2019-09-06 ENCOUNTER — Ambulatory Visit: Payer: Medicare Other | Attending: Family | Admitting: Family

## 2019-09-06 ENCOUNTER — Telehealth (INDEPENDENT_AMBULATORY_CARE_PROVIDER_SITE_OTHER): Payer: Self-pay | Admitting: Vascular Surgery

## 2019-09-06 VITALS — BP 104/70 | HR 109 | Resp 96 | Ht 67.0 in | Wt 209.0 lb

## 2019-09-06 DIAGNOSIS — Z79899 Other long term (current) drug therapy: Secondary | ICD-10-CM | POA: Insufficient documentation

## 2019-09-06 DIAGNOSIS — M1990 Unspecified osteoarthritis, unspecified site: Secondary | ICD-10-CM | POA: Insufficient documentation

## 2019-09-06 DIAGNOSIS — F17210 Nicotine dependence, cigarettes, uncomplicated: Secondary | ICD-10-CM | POA: Insufficient documentation

## 2019-09-06 DIAGNOSIS — I5022 Chronic systolic (congestive) heart failure: Secondary | ICD-10-CM

## 2019-09-06 DIAGNOSIS — Z7982 Long term (current) use of aspirin: Secondary | ICD-10-CM | POA: Diagnosis not present

## 2019-09-06 DIAGNOSIS — I110 Hypertensive heart disease with heart failure: Secondary | ICD-10-CM | POA: Diagnosis not present

## 2019-09-06 DIAGNOSIS — Z8349 Family history of other endocrine, nutritional and metabolic diseases: Secondary | ICD-10-CM | POA: Diagnosis not present

## 2019-09-06 DIAGNOSIS — I70209 Unspecified atherosclerosis of native arteries of extremities, unspecified extremity: Secondary | ICD-10-CM | POA: Diagnosis not present

## 2019-09-06 DIAGNOSIS — Z8249 Family history of ischemic heart disease and other diseases of the circulatory system: Secondary | ICD-10-CM | POA: Diagnosis not present

## 2019-09-06 DIAGNOSIS — Z7902 Long term (current) use of antithrombotics/antiplatelets: Secondary | ICD-10-CM | POA: Diagnosis not present

## 2019-09-06 DIAGNOSIS — I2510 Atherosclerotic heart disease of native coronary artery without angina pectoris: Secondary | ICD-10-CM | POA: Insufficient documentation

## 2019-09-06 DIAGNOSIS — Z89511 Acquired absence of right leg below knee: Secondary | ICD-10-CM | POA: Insufficient documentation

## 2019-09-06 DIAGNOSIS — I255 Ischemic cardiomyopathy: Secondary | ICD-10-CM | POA: Diagnosis not present

## 2019-09-06 DIAGNOSIS — E1140 Type 2 diabetes mellitus with diabetic neuropathy, unspecified: Secondary | ICD-10-CM | POA: Insufficient documentation

## 2019-09-06 DIAGNOSIS — G2581 Restless legs syndrome: Secondary | ICD-10-CM | POA: Insufficient documentation

## 2019-09-06 DIAGNOSIS — I509 Heart failure, unspecified: Secondary | ICD-10-CM | POA: Diagnosis present

## 2019-09-06 DIAGNOSIS — E1151 Type 2 diabetes mellitus with diabetic peripheral angiopathy without gangrene: Secondary | ICD-10-CM | POA: Diagnosis not present

## 2019-09-06 DIAGNOSIS — I10 Essential (primary) hypertension: Secondary | ICD-10-CM

## 2019-09-06 DIAGNOSIS — E7800 Pure hypercholesterolemia, unspecified: Secondary | ICD-10-CM | POA: Diagnosis not present

## 2019-09-06 DIAGNOSIS — Z955 Presence of coronary angioplasty implant and graft: Secondary | ICD-10-CM | POA: Diagnosis not present

## 2019-09-06 DIAGNOSIS — J449 Chronic obstructive pulmonary disease, unspecified: Secondary | ICD-10-CM | POA: Diagnosis not present

## 2019-09-06 DIAGNOSIS — Z794 Long term (current) use of insulin: Secondary | ICD-10-CM | POA: Diagnosis not present

## 2019-09-06 DIAGNOSIS — E785 Hyperlipidemia, unspecified: Secondary | ICD-10-CM | POA: Insufficient documentation

## 2019-09-06 DIAGNOSIS — I70219 Atherosclerosis of native arteries of extremities with intermittent claudication, unspecified extremity: Secondary | ICD-10-CM

## 2019-09-06 DIAGNOSIS — I5042 Chronic combined systolic (congestive) and diastolic (congestive) heart failure: Secondary | ICD-10-CM | POA: Insufficient documentation

## 2019-09-06 DIAGNOSIS — G4733 Obstructive sleep apnea (adult) (pediatric): Secondary | ICD-10-CM | POA: Insufficient documentation

## 2019-09-06 DIAGNOSIS — Z888 Allergy status to other drugs, medicaments and biological substances status: Secondary | ICD-10-CM | POA: Diagnosis not present

## 2019-09-06 DIAGNOSIS — Z720 Tobacco use: Secondary | ICD-10-CM

## 2019-09-06 NOTE — Progress Notes (Signed)
Patient ID: [name] [name], male    DOB: 03-18-1961, 59 y.o.   MRN: [id number]  HPI  [name] [name] is a 59 y/o male with a history of obstructive sleep apnea, PVD, PAD, HTN, hyperlipidemia, DM, CAD, COPD, BPH, asthma, current tobacco use and chronic heart failure.   Echo report from 11/06/2018 reviewed and showed an EF of 20-25% along with moderate TR and a PA pressure of 59.6 mmHg. Echo report from 04/25/17 reviewed and shows an EF of 25-30% along with a PA pressure of 34 mm Hg. EF has declined from 55-60% back in 2016.   Cardiac catheterization done 04/28/17 showed significant three-vessel disease with a patient stent in the RCA and LAD. Significant proximal RCS disease and the stent as well as diffuse mid and distal disease. LAD has moderate disease. Severely elevated left ventricular end-diastolic pressure at 34 mmHg. Possible CABG in the future with optimizing medical management. Stress done in 2015.  Was in the ED 3/22/210 due to HF exacerbation. Nebulizer given for wheezing. CT and [place] were negative for clots. Mildly elevated troponin thought to be due to demand ischemia. IV antibiotics given for LLL cellulitis. Patient left AMA  He presents today for a follow-up visit with a chief complaint of moderate shortness of breath upon moderate exertion. He says that this has been chronic in nature having been present for several years. He has associated fatigue, cough, leg edema, headaches, redness on left lower leg and chronic pain along with this. He denies any difficulty sleeping, dizziness, abdominal distention, palpitations, chest pain, wheezing or weight gain.   He brought all his medications with him and has them divided into whether he's been taking them or not. He said he got scared and wasn't sure what he was supposed to be taking anymore so has been taking very little. He has numerous bottles of duplicates that are in his bag.     Past Medical History:  Diagnosis Date  . Arthritis   . Asthma    . Atherosclerosis   . BPH (benign prostatic hyperplasia)   . Carotid arterial disease ([place])   . Charcot's joint of foot, right   . Chronic combined systolic (congestive) and diastolic (congestive) heart failure ([place])    a. 02/2013 EF 50% by LV gram; b. 04/2017 Echo: EF 25-30%. diff HK. Gr2 DD. Mod dil LA/RV. PASP 7mmHg.  [name] COPD (chronic obstructive pulmonary disease) ([place])   . Coronary artery disease    a. 2013 S/P PCI of LAD [facility]);  b. 03/2013 PCI: RCA 90p ( 2.5 x 23 mm DES); c. 02/2014 Cath: patent RCA stent->Med Rx; d. 04/2017 Cath: LM nl, LAd 30ost/p, 20m/d, D1 80ost, LCX 90p/m, OM1 85, RCA 70/20p, 35m, 70d->Referred for CT Surg-felt to be poor candidate.  . Diabetic neuropathy ([place])   . Diabetic ulcer of right foot ([place])   . Hernia   . Hyperlipidemia   . Hypertension   . Ischemic cardiomyopathy    a. 04/2017 Echo: EF 25-30%; b. 08/2017 Cardiac MRI ([facility]): EF 19%, sev glob HK. RVEF 20%, mod BAE, triv [name], mild-mod TR. Basal lateral subendocardial infarct (viable), inf/infsept, dist septal ischemia, basal to mid lat peri-infarct ischemia.  . Morbid obesity ([place])   . Neuropathy   . PAD (peripheral artery disease) ([place])    a. Followed by Dr. Lucky Cowboy; b. 08/2010 Periph Angio: RSFA 70-80p (6X60 self-expanding stent); c. 09/2010 Periph Angio: L SFA 100p (7x unknown length self-expanding stent); d. 06/2015 Periph Angio: R SFA short  segment occlusion (6x12 self-expanding stent).  . Restless leg syndrome   . Sleep apnea   . Subclavian artery stenosis, right ([place])   . Syncope and collapse   . Tobacco use    a. 75+ yr hx - still smoking 1ppd, down from 2 ppd.  . Type II diabetes mellitus ([place])   . Varicose vein    Past Surgical History:  Procedure Laterality Date  . ABDOMINAL AORTAGRAM N/A 06/23/2013   Procedure: ABDOMINAL Maxcine Ham;  Surgeon: Wellington Hampshire, MD;  Location: [place] CATH [facility];  Service: Cardiovascular;  Laterality: N/A;  . AMPUTATION Right 04/01/2018   Procedure: AMPUTATION BELOW KNEE;   Surgeon: Algernon Huxley, MD;  Location: [facility] ORS;  Service: Vascular;  Laterality: Right;  . APPENDECTOMY    . CARDIAC CATHETERIZATION  10/14   [facility]; X1 STENT PROXIMAL RCA  . CARDIAC CATHETERIZATION  02/2010   [facility]  . CARDIAC CATHETERIZATION  02/21/2014   [facility]  . CLAVICLE SURGERY    . HERNIA REPAIR    . IRRIGATION AND DEBRIDEMENT FOOT Right 01/04/2018   Procedure: IRRIGATION AND DEBRIDEMENT FOOT;  Surgeon: Samara Deist, DPM;  Location: [facility] ORS;  Service: Podiatry;  Laterality: Right;  . LEFT HEART CATH AND CORS/GRAFTS ANGIOGRAPHY N/A 04/28/2017   Procedure: LEFT HEART CATH AND CORONARY ANGIOGRAPHY;  Surgeon: Wellington Hampshire, MD;  Location: [place] CV [facility];  Service: Cardiovascular;  Laterality: N/A;  . LOWER EXTREMITY ANGIOGRAPHY Right 03/03/2018   Procedure: LOWER EXTREMITY ANGIOGRAPHY;  Surgeon: Katha Cabal, MD;  Location: [place] [facility];  Service: Cardiovascular;  Laterality: Right;  . LOWER EXTREMITY ANGIOGRAPHY Left 08/24/2018   Procedure: LOWER EXTREMITY ANGIOGRAPHY;  Surgeon: Algernon Huxley, MD;  Location: [place] CV [facility];  Service: Cardiovascular;  Laterality: Left;  . LOWER EXTREMITY ANGIOGRAPHY Left 06/14/2019   Procedure: LOWER EXTREMITY ANGIOGRAPHY;  Surgeon: Algernon Huxley, MD;  Location: [place] [facility];  Service: Cardiovascular;  Laterality: Left;  . LOWER EXTREMITY ANGIOGRAPHY Left 08/02/2019   Procedure: LOWER EXTREMITY ANGIOGRAPHY;  Surgeon: Algernon Huxley, MD;  Location: [place] [facility];  Service: Cardiovascular;  Laterality: Left;  . PERIPHERAL ARTERIAL STENT GRAFT     x2 left/right  . PERIPHERAL VASCULAR BALLOON ANGIOPLASTY Right 01/06/2018   Procedure: PERIPHERAL VASCULAR BALLOON ANGIOPLASTY;  Surgeon: Katha Cabal, MD;  Location: [place] CV [facility];  Service: Cardiovascular;  Laterality: Right;  . PERIPHERAL VASCULAR CATHETERIZATION N/A 07/06/2015   Procedure: Abdominal Aortogram w/Lower Extremity;  Surgeon: Algernon Huxley, MD;  Location:  [place] CV [facility];  Service: Cardiovascular;  Laterality: N/A;  . PERIPHERAL VASCULAR CATHETERIZATION  07/06/2015   Procedure: Lower Extremity Intervention;  Surgeon: Algernon Huxley, MD;  Location: [place] [facility];  Service: Cardiovascular;;   Family History  Problem Relation Age of Onset  . Heart attack Father   . Heart disease Father   . Hypertension Father   . Hyperlipidemia Father   . Hypertension Mother   . Hyperlipidemia Mother    Social History   Tobacco Use  . Smoking status: Current Every Day Smoker    Packs/day: 1.00    Years: 41.00    Pack years: 41.00    Types: Cigarettes  . Smokeless tobacco: Never Used  Substance Use Topics  . Alcohol use: No   Allergies  Allergen Reactions  . Dulaglutide Anaphylaxis, Diarrhea and Hives  . Other Itching    Skin itching associated with nitro patch  . Prednisone Rash   Prior to  Admission medications   Medication Sig Start Date End Date Taking? Authorizing Provider  albuterol (VENTOLIN HFA) 108 (90 Base) MCG/ACT inhaler Inhale into the lungs every 6 (six) hours as needed for wheezing or shortness of breath.   Yes [provider]  gabapentin (NEURONTIN) 800 MG tablet Take 800 mg by mouth 4 (four) times daily.  08/14/17 09/06/19 Yes [provider]  Insulin Glargine (LANTUS SOLOSTAR) 100 UNIT/ML Solostar Pen Inject 30 Units into the skin at bedtime. Patient taking differently: Inject 20-40 Units into the skin 2 (two) times daily. 40 units in the morning and 20 units at night 04/07/18  Yes Mayo, Pete Pelt, MD  insulin lispro (HUMALOG) 100 UNIT/ML injection Sliding Scale Insulin before meals using Novolog If BS is 0 to 70 no insulin and eat/glucose tablets/glucose get If BS 71 to 150 great! If BS 151 to 200, 2 Units Novolog If BS 201 to 250, 4 Units Novolog If BS 251 to 300, 6 Units Novolog If BS 301 to 350, 8 Units Novolog If BS 351 to 400, 10 Units Novolog If BS greater than 400, take 10 Units and Contact our office  during office hour 04/14/18  Yes [provider]  metFORMIN (GLUCOPHAGE-XR) 500 MG 24 hr tablet Take 1,000 mg by mouth 2 (two) times daily.    Yes [provider]  nitroGLYCERIN (NITRODUR - DOSED IN MG/24 HR) 0.4 mg/hr patch Place 0.4 mg onto the skin daily.   Yes [provider]  Oxycodone HCl 10 MG TABS Take 10 mg by mouth every 6 (six) hours as needed (pain).  01/15/18  Yes [provider]  promethazine (PHENERGAN) 25 MG tablet TAKE 1 TABLET BY MOUTH EVERY 6 HOURS AS NEEDED NAUSEA 04/29/18  Yes [provider]  sulfamethoxazole-trimethoprim (BACTRIM DS) 800-160 MG tablet Take 1 tablet by mouth 2 (two) times daily. 07/22/19  Yes Kris Hartmann, NP  torsemide (DEMADEX) 20 MG tablet Take 2 tablets (40 mg total) by mouth 2 (two) times daily. 40mg  AM/ 20mg  PM Patient taking differently: Take 40 mg by mouth 2 (two) times daily.  08/31/19  Yes Minna Merritts, MD  traZODone (DESYREL) 150 MG tablet Take 150 mg by mouth at bedtime. 07/12/19  Yes [provider]  aspirin EC 81 MG tablet Take 1 tablet (81 mg total) by mouth daily.  05/11/15   Wellington Hampshire, MD  atorvastatin (LIPITOR) 80 MG tablet Take 80 mg by mouth daily. Patient not taking    [provider]  calcium carbonate (TUMS - DOSED IN MG ELEMENTAL CALCIUM) 500 MG chewable tablet Chew 4 tablets by mouth daily as needed for indigestion or heartburn.    [provider]  cephALEXin (KEFLEX) 500 MG capsule Take 1 capsule (500 mg total) by mouth 4 (four) times daily.  08/31/19   Minna Merritts, MD  clindamycin (CLEOCIN) 300 MG capsule Take by mouth. 05/24/19   [provider]  clopidogrel (PLAVIX) 75 MG tablet Take 75 mg by mouth daily.  Patient not taking 08/22/17   [provider]  collagenase (SANTYL) ointment Apply 1 application topically daily. Patient not taking: Reported on 09/06/2019 08/11/19   Kris Hartmann, NP  ENTRESTO 24-26 MG TAKE 1 TABLET BY MOUTH  TWICE DAILY Patient not taking: Reported on 09/06/2019 06/19/19   Alisa Graff, FNP  ezetimibe (ZETIA) 10 MG tablet Take 1 tablet (10 mg total) by mouth daily. Patient not taking: Reported on 09/06/2019 05/19/19   Alisa Graff,  FNP  ipratropium-albuterol (DUONEB) 0.5-2.5 (3) MG/3ML SOLN Inhale 3 mLs into the lungs every 6 (six) hours as needed (wheezing/sob).     [provider]  isosorbide mononitrate (IMDUR) 30 MG 24 hr tablet Take 1 tablet (30 mg total) by mouth daily. Patient not taking: Reported on 09/06/2019 05/19/19   Darylene Price A, FNP  metoprolol succinate (TOPROL-XL) 25 MG 24 hr tablet Take 1 tablet (25 mg total) by mouth daily. Patient not taking: Reported on 09/06/2019 07/02/19 08/29/20  Loel Dubonnet, NP  Multiple Vitamin (MULTI-VITAMINS) TABS Take 1 tablet by mouth daily.     [provider]  naproxen (NAPROSYN) 500 MG tablet Take 1 tablet (500 mg total) by mouth 2 (two) times daily with a meal. Patient not taking: Reported on 09/06/2019 03/08/19   Lavonia Drafts, MD  nitroGLYCERIN (NITROSTAT) 0.4 MG SL tablet Place 1 tablet (0.4 mg total) under the tongue every 5 (five) minutes as needed for chest pain.  05/19/19   Alisa Graff, FNP  potassium chloride (KLOR-CON) 10 MEQ tablet Take 1 tablet (10 mEq total) by mouth daily. Patient not taking: Reported on 09/06/2019 05/19/19   Darylene Price A, FNP  spironolactone (ALDACTONE) 25 MG tablet Take 25 mg by mouth daily.  Patient not taking 03/02/18   [provider]     Review of Systems  Constitutional: Positive for fatigue (minimal). Negative for appetite change.  HENT: Positive for rhinorrhea. Negative for congestion, postnasal drip and sore throat.   Eyes: Negative.   Respiratory: Positive for cough and shortness of breath (upon exertion). Negative for chest tightness and wheezing.   Cardiovascular: Positive for leg swelling (left leg). Negative for chest pain and palpitations.  Gastrointestinal:  Negative for abdominal distention and abdominal pain.  Endocrine: Negative.   Genitourinary: Negative.   Musculoskeletal: Positive for back pain. Negative for neck pain.       Has right lower leg prosthesis  Skin: Positive for wound (left foot).  Allergic/Immunologic: Negative.   Neurological: Positive for headaches. Negative for dizziness and light-headedness.  Hematological: Negative for adenopathy. Bruises/bleeds easily.  Psychiatric/Behavioral: Negative for dysphoric mood and sleep disturbance (sleeping on 2 pillows). The patient is not nervous/anxious.    Vitals:   09/06/19 1218 09/06/19 1249  BP: (!) 90/55 104/70  Pulse: (!) 109   Resp: (!) 96   SpO2: 100%   Weight: 209 lb (94.8 kg)   Height: 5\' 7"  (1.702 m)    Wt Readings from Last 3 Encounters:  09/06/19 209 lb (94.8 kg)  08/31/19 217 lb 2 oz (98.5 kg)  08/30/19 207 lb 14.3 oz (94.3 kg)   Lab Results  Component Value Date   CREATININE 0.83 08/30/2019   CREATININE 1.01 08/02/2019   CREATININE 0.98 07/02/2019     Physical Exam  Constitutional: He is oriented to person, place, and time. He appears well-developed and well-nourished.  HENT:  Head: Normocephalic and atraumatic.  Neck: No JVD present.  Cardiovascular: Regular rhythm. Tachycardia present.  Pulmonary/Chest: Effort normal. He has no wheezes. He has no rhonchi. He has no rales.  Abdominal: Soft. He exhibits no distension. There is no abdominal tenderness.  Musculoskeletal:        General: Edema (+ pitting edema in left lower leg ) present. No tenderness.     Cervical back: Normal range of motion and neck supple.  Neurological: He is alert and oriented to person, place, and time.  Skin: Skin is warm and dry. There is erythema (left anterior shin/  shiny in appearance).  Psychiatric: He has a normal mood and affect. His behavior is normal. Thought content normal.  Nursing note and vitals reviewed.   Assessment & Plan:  1: Chronic heart failure with  reduced ejection fraction- - NYHA class II - euvolemic today - not weighing daily; encouraged to resume so that he could call for an overnight weight gain of >2 pounds or a weekly weight gain of >5 pounds - weight unchanged from last visit here 3 months ago although patient's weight today is stated as he refused to stand due to pain - not adding salt and has been using Mrs. Dash & trying to read food labels.  - drinking ~ 64 ounces of fluid daily (Mtn Dew, pepsi, water) - saw cardiologist Rockey Situ) 08/31/19 & said to take torsemide 40mg  BID until edema improved; patient has only been taking 40mg  AM/20mg  PM but will start 40mg  BID - BNP 08/30/19 was 2054.0 - says that he does not take the flu vaccine; good handwashing encouraged  2: HTN- - BP initially low but slightly better upon recheck - saw PCP Dr. 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 Reviewed everything in depth and emphasized that he needed to bring his meds that he's taking as well as what he's not taking to every provider every visit. Based on his extensive atherosclerosis history, we will resume his atorvastatin, clopidogrel and isosobide. BP too low for entresto right now, probably needs his metoprolol for his tachycardia at some point as well.   Return in 1 week or sooner for any questions/problems before then.

## 2019-09-06 NOTE — Patient Instructions (Addendum)
Resume weighing daily and call for an overnight weight gain of > 2 pounds or a weekly weight gain of >5 pounds. 

## 2019-09-06 NOTE — Telephone Encounter (Signed)
Patient and her left lower extremity arterial duplex and to be seen

## 2019-09-07 ENCOUNTER — Other Ambulatory Visit (INDEPENDENT_AMBULATORY_CARE_PROVIDER_SITE_OTHER): Payer: Self-pay | Admitting: Nurse Practitioner

## 2019-09-07 DIAGNOSIS — M79605 Pain in left leg: Secondary | ICD-10-CM

## 2019-09-08 ENCOUNTER — Ambulatory Visit (INDEPENDENT_AMBULATORY_CARE_PROVIDER_SITE_OTHER): Payer: Medicare Other | Admitting: Nurse Practitioner

## 2019-09-08 ENCOUNTER — Encounter (INDEPENDENT_AMBULATORY_CARE_PROVIDER_SITE_OTHER): Payer: Medicare Other

## 2019-09-08 ENCOUNTER — Ambulatory Visit (INDEPENDENT_AMBULATORY_CARE_PROVIDER_SITE_OTHER): Payer: Medicare Other

## 2019-09-08 ENCOUNTER — Other Ambulatory Visit: Payer: Self-pay

## 2019-09-08 ENCOUNTER — Encounter (INDEPENDENT_AMBULATORY_CARE_PROVIDER_SITE_OTHER): Payer: Self-pay | Admitting: Nurse Practitioner

## 2019-09-08 VITALS — BP 101/67 | HR 108 | Resp 16 | Wt 217.0 lb

## 2019-09-08 DIAGNOSIS — I739 Peripheral vascular disease, unspecified: Secondary | ICD-10-CM

## 2019-09-08 DIAGNOSIS — M79605 Pain in left leg: Secondary | ICD-10-CM | POA: Diagnosis not present

## 2019-09-08 DIAGNOSIS — L03116 Cellulitis of left lower limb: Secondary | ICD-10-CM | POA: Diagnosis not present

## 2019-09-08 DIAGNOSIS — E11628 Type 2 diabetes mellitus with other skin complications: Secondary | ICD-10-CM | POA: Diagnosis not present

## 2019-09-08 DIAGNOSIS — L089 Local infection of the skin and subcutaneous tissue, unspecified: Secondary | ICD-10-CM

## 2019-09-08 DIAGNOSIS — I10 Essential (primary) hypertension: Secondary | ICD-10-CM | POA: Diagnosis not present

## 2019-09-08 MED ORDER — DOXYCYCLINE HYCLATE 100 MG PO CAPS: 100.0000 mg | ORAL_CAPSULE | Freq: Two times a day (BID) | ORAL | 0 refills | Status: DC

## 2019-09-10 ENCOUNTER — Emergency Department: Payer: Medicare Other

## 2019-09-10 ENCOUNTER — Other Ambulatory Visit: Payer: Self-pay

## 2019-09-10 ENCOUNTER — Emergency Department
Admission: EM | Admit: 2019-09-10 | Discharge: 2019-09-10 | Disposition: A | Discharge status: Home / Self Care | Payer: Medicare Other | Attending: Emergency Medicine | Admitting: Emergency Medicine

## 2019-09-10 ENCOUNTER — Telehealth: Payer: Self-pay | Admitting: Family

## 2019-09-10 DIAGNOSIS — Z794 Long term (current) use of insulin: Secondary | ICD-10-CM | POA: Insufficient documentation

## 2019-09-10 DIAGNOSIS — Z79899 Other long term (current) drug therapy: Secondary | ICD-10-CM | POA: Diagnosis not present

## 2019-09-10 DIAGNOSIS — I2510 Atherosclerotic heart disease of native coronary artery without angina pectoris: Secondary | ICD-10-CM | POA: Diagnosis not present

## 2019-09-10 DIAGNOSIS — J45909 Unspecified asthma, uncomplicated: Secondary | ICD-10-CM | POA: Insufficient documentation

## 2019-09-10 DIAGNOSIS — I110 Hypertensive heart disease with heart failure: Secondary | ICD-10-CM | POA: Insufficient documentation

## 2019-09-10 DIAGNOSIS — E119 Type 2 diabetes mellitus without complications: Secondary | ICD-10-CM | POA: Insufficient documentation

## 2019-09-10 DIAGNOSIS — Z7902 Long term (current) use of antithrombotics/antiplatelets: Secondary | ICD-10-CM | POA: Insufficient documentation

## 2019-09-10 DIAGNOSIS — Z955 Presence of coronary angioplasty implant and graft: Secondary | ICD-10-CM | POA: Diagnosis not present

## 2019-09-10 DIAGNOSIS — M79605 Pain in left leg: Secondary | ICD-10-CM | POA: Diagnosis present

## 2019-09-10 DIAGNOSIS — Z7982 Long term (current) use of aspirin: Secondary | ICD-10-CM | POA: Insufficient documentation

## 2019-09-10 DIAGNOSIS — J449 Chronic obstructive pulmonary disease, unspecified: Secondary | ICD-10-CM | POA: Insufficient documentation

## 2019-09-10 DIAGNOSIS — I509 Heart failure, unspecified: Secondary | ICD-10-CM

## 2019-09-10 DIAGNOSIS — I5042 Chronic combined systolic (congestive) and diastolic (congestive) heart failure: Secondary | ICD-10-CM | POA: Insufficient documentation

## 2019-09-10 DIAGNOSIS — F17210 Nicotine dependence, cigarettes, uncomplicated: Secondary | ICD-10-CM | POA: Insufficient documentation

## 2019-09-10 LAB — CBC WITH DIFFERENTIAL/PLATELET
Abs Immature Granulocytes: 0.03 10*3/uL (ref 0.00–0.07)
Basophils Absolute: 0 10*3/uL (ref 0.0–0.1)
Basophils Relative: 1 %
Eosinophils Absolute: 0.1 10*3/uL (ref 0.0–0.5)
Eosinophils Relative: 1 %
HCT: 38.3 % — ABNORMAL LOW (ref 39.0–52.0)
Hemoglobin: 12 g/dL — ABNORMAL LOW (ref 13.0–17.0)
Immature Granulocytes: 1 %
Lymphocytes Relative: 14 %
Lymphs Abs: 0.8 10*3/uL (ref 0.7–4.0)
MCH: 23 pg — ABNORMAL LOW (ref 26.0–34.0)
MCHC: 31.3 g/dL (ref 30.0–36.0)
MCV: 73.4 fL — ABNORMAL LOW (ref 80.0–100.0)
Monocytes Absolute: 0.5 10*3/uL (ref 0.1–1.0)
Monocytes Relative: 8 %
Neutro Abs: 4.3 10*3/uL (ref 1.7–7.7)
Neutrophils Relative %: 75 %
Platelets: 187 10*3/uL (ref 150–400)
RBC: 5.22 MIL/uL (ref 4.22–5.81)
RDW: 25.7 % — ABNORMAL HIGH (ref 11.5–15.5)
WBC: 5.7 10*3/uL (ref 4.0–10.5)
nRBC: 0 % (ref 0.0–0.2)

## 2019-09-10 LAB — COMPREHENSIVE METABOLIC PANEL
ALT: 11 U/L (ref 0–44)
AST: 25 U/L (ref 15–41)
Albumin: 3.8 g/dL (ref 3.5–5.0)
Alkaline Phosphatase: 104 U/L (ref 38–126)
Anion gap: 11 (ref 5–15)
BUN: 20 mg/dL (ref 6–20)
CO2: 30 mmol/L (ref 22–32)
Calcium: 9 mg/dL (ref 8.9–10.3)
Chloride: 87 mmol/L — ABNORMAL LOW (ref 98–111)
Creatinine, Ser: 1.44 mg/dL — ABNORMAL HIGH (ref 0.61–1.24)
GFR calc Af Amer: >60 mL/min (ref >60)
GFR calc non Af Amer: 54 mL/min — ABNORMAL LOW (ref >60)
Glucose, Bld: 116 mg/dL — ABNORMAL HIGH (ref 70–99)
Potassium: 4.3 mmol/L (ref 3.5–5.1)
Sodium: 128 mmol/L — ABNORMAL LOW (ref 135–145)
Total Bilirubin: 1.5 mg/dL — ABNORMAL HIGH (ref 0.3–1.2)
Total Protein: 7.5 g/dL (ref 6.5–8.1)

## 2019-09-10 LAB — URINALYSIS, COMPLETE (UACMP) WITH MICROSCOPIC
Bacteria, UA: NONE SEEN
Bilirubin Urine: NEGATIVE
Glucose, UA: NEGATIVE mg/dL
Hgb urine dipstick: NEGATIVE
Ketones, ur: NEGATIVE mg/dL
Leukocytes,Ua: NEGATIVE
Nitrite: NEGATIVE
Protein, ur: NEGATIVE mg/dL
Specific Gravity, Urine: 1.003 — ABNORMAL LOW (ref 1.005–1.030)
Squamous Epithelial / HPF: NONE SEEN (ref 0–5)
WBC, UA: NONE SEEN WBC/hpf (ref 0–5)
pH: 6 (ref 5.0–8.0)

## 2019-09-10 LAB — LACTIC ACID, PLASMA: Lactic Acid, Venous: 1.3 mmol/L (ref 0.5–1.9)

## 2019-09-10 MED ORDER — FUROSEMIDE 10 MG/ML IJ SOLN
60.0000 mg | Freq: Once | INTRAMUSCULAR | Status: AC
  Administered 2019-09-10: 60 mg via INTRAVENOUS
  Filled 2019-09-10: qty 8

## 2019-09-10 MED ORDER — SODIUM CHLORIDE 0.9% FLUSH: 3.0000 mL | Freq: Once | INTRAVENOUS | Status: DC

## 2019-09-10 NOTE — ED Notes (Signed)
Pt states unable to void at this time since he just went in the lobby BR.

## 2019-09-10 NOTE — ED Notes (Signed)
Pt assisted to commode by ed lobby screener.

## 2019-09-10 NOTE — ED Provider Notes (Signed)
[facility] Emergency Department Provider Note   ____________________________________________   I have reviewed the triage vital signs and the nursing notes.   HISTORY  Chief Complaint Leg Pain   History limited by: Not Limited   HPI [name] [name] is a 59 y.o. male who presents to the emergency department today because of concerns for swelling.  He states that his swelling is gotten worse over the past few days.  Located primarily in the legs.  It is accompanied by some discomfort in his legs.  Additionally the patient states he has had some increased shortness of breath.  He states that he has been prescribed fluid pills but he has not taken them in "quite a while".  He denies any chest pain.  Denies any fevers.   Records reviewed. Per medical record review patient has a history of CHF, COPD.  Past Medical History:  Diagnosis Date  . Arthritis   . Asthma   . Atherosclerosis   . BPH (benign prostatic hyperplasia)   . Carotid arterial disease ([place])   . Charcot's joint of foot, right   . Chronic combined systolic (congestive) and diastolic (congestive) heart failure ([place])    a. 02/2013 EF 50% by LV gram; b. 04/2017 Echo: EF 25-30%. diff HK. Gr2 DD. Mod dil LA/RV. PASP 14mmHg.  [name] COPD (chronic obstructive pulmonary disease) ([place])   . Coronary artery disease    a. 2013 S/P PCI of LAD [facility]);  b. 03/2013 PCI: RCA 90p ( 2.5 x 23 mm DES); c. 02/2014 Cath: patent RCA stent->Med Rx; d. 04/2017 Cath: LM nl, LAd 30ost/p, 45m/d, D1 80ost, LCX 90p/m, OM1 85, RCA 70/20p, 56m, 70d->Referred for CT Surg-felt to be poor candidate.  . Diabetic neuropathy ([place])   . Diabetic ulcer of right foot ([place])   . Hernia   . Hyperlipidemia   . Hypertension   . Ischemic cardiomyopathy    a. 04/2017 Echo: EF 25-30%; b. 08/2017 Cardiac MRI ([facility]): EF 19%, sev glob HK. RVEF 20%, mod BAE, triv MR, mild-mod TR. Basal lateral subendocardial infarct (viable), inf/infsept, dist septal  ischemia, basal to mid lat peri-infarct ischemia.  . Morbid obesity ([place])   . Neuropathy   . PAD (peripheral artery disease) ([place])    a. Followed by Dr. Lucky Cowboy; b. 08/2010 Periph Angio: RSFA 70-80p (6X60 self-expanding stent); c. 09/2010 Periph Angio: L SFA 100p (7x unknown length self-expanding stent); d. 06/2015 Periph Angio: R SFA short segment occlusion (6x12 self-expanding stent).  . Restless leg syndrome   . Sleep apnea   . Subclavian artery stenosis, right ([place])   . Syncope and collapse   . Tobacco use    a. 75+ yr hx - still smoking 1ppd, down from 2 ppd.  . Type II diabetes mellitus ([place])   . Varicose vein     Patient Active Problem List   Diagnosis Date Noted  . Cellulitis of left leg 08/31/2019  . CAD (coronary artery disease) 08/31/2019  . Acute on chronic systolic CHF (congestive heart failure) ([place]) 11/05/2018  . Open toe wound 10/01/2018  . Venous ulcer of left leg ([place]) 08/19/2018  . 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[place]) 04/28/2018  . Sepsis ([place]) 03/30/2018  . Abnormal MRI, shoulder (Right) 02/23/2018  . Abnormal MRI, cervical spine (2016) 02/23/2018  . DDD (degenerative disc disease), cervical 02/23/2018  . Cervical foraminal stenosis (Bilateral) 02/23/2018  . Cervical central spinal stenosis 02/23/2018  . Cervical facet hypertrophy 02/23/2018  . Arthralgia of acromioclavicular joint (  Right) 02/23/2018  . Osteoarthritis of  AC (acromioclavicular) joint (Right) 02/23/2018  . Biceps tendinosis of shoulder (Right) 02/23/2018  . Tendinopathy of rotator cuff (Right) 02/23/2018  . Vitamin D deficiency 02/23/2018  . Atherosclerosis of native arteries of the extremities with ulceration ([place]) 02/15/2018  . Peripheral edema 02/05/2018  . Status post peripherally inserted central catheter (PICC) central line placement 02/04/2018  . Chronic lower extremity pain (Primary Area of Pain) (Right) 01/29/2018  . Chronic shoulder pain [facility] Pain) (Right)  01/29/2018  . Chronic foot pain (Secondary Area of Pain) (Right) 01/29/2018  . Chronic pain syndrome 01/29/2018  . Long term current use of opiate analgesic 01/29/2018  . Medication monitoring encounter 01/29/2018  . Disorder of skeletal system 01/29/2018  . Problems influencing health status 01/29/2018  . AKI (acute kidney injury) ([place]) 01/03/2018  . NSTEMI (non-ST elevated myocardial infarction) ([place]) 11/21/2017  . Coronary artery disease of native artery of native heart with stable angina pectoris ([place]) 09/10/2017  . HLD (hyperlipidemia) 09/10/2017  . Angina pectoris ([place]) 09/08/2017  . Angina at rest [facility]) 08/13/2017  . Lymphedema 05/02/2017  . Chronic systolic HF (heart failure) ([place]) 04/23/2017  . Chest pain 07/11/2016  . Unstable angina ([place]) 07/11/2016  . Foot ulcer (Right)   . Stable angina (HCC)   . Pressure injury of skin 05/06/2016  . Diabetic foot infection ([place]) 05/05/2016  . Type 2 diabetes mellitus with complication, with long-term current use of insulin ([place]) 01/26/2016  . Cellulitis 11/21/2015  . Morbid obesity ([place]) 11/17/2014  . PAD (peripheral artery disease) ([place])   . Hypertension   . Tobacco use   . COPD (chronic obstructive pulmonary disease) ([place]) 05/19/2012  . Diabetic neuropathy ([place]) 05/19/2012    Past Surgical History:  Procedure Laterality Date  . ABDOMINAL AORTAGRAM N/A 06/23/2013   Procedure: ABDOMINAL Maxcine Ham;  Surgeon: Wellington Hampshire, MD;  Location: [place] CATH LAB;  Service: Cardiovascular;  Laterality: N/A;  . AMPUTATION Right 04/01/2018   Procedure: AMPUTATION BELOW KNEE;  Surgeon: Algernon Huxley, MD;  Location: [facility] ORS;  Service: Vascular;  Laterality: Right;  . APPENDECTOMY    . CARDIAC CATHETERIZATION  10/14   [facility]; X1 STENT PROXIMAL RCA  . CARDIAC CATHETERIZATION  02/2010   [facility]  . CARDIAC CATHETERIZATION  02/21/2014   [facility]  . CLAVICLE SURGERY    . HERNIA REPAIR    . IRRIGATION AND DEBRIDEMENT FOOT Right 01/04/2018   Procedure:  IRRIGATION AND DEBRIDEMENT FOOT;  Surgeon: Samara Deist, DPM;  Location: [facility] ORS;  Service: Podiatry;  Laterality: Right;  . LEFT HEART CATH AND CORS/GRAFTS ANGIOGRAPHY N/A 04/28/2017   Procedure: LEFT HEART CATH AND CORONARY ANGIOGRAPHY;  Surgeon: Wellington Hampshire, MD;  Location: [place] CV LAB;  Service: Cardiovascular;  Laterality: N/A;  . LOWER EXTREMITY ANGIOGRAPHY Right 03/03/2018   Procedure: LOWER EXTREMITY ANGIOGRAPHY;  Surgeon: Katha Cabal, MD;  Location: [place] CV LAB;  Service: Cardiovascular;  Laterality: Right;  . LOWER EXTREMITY ANGIOGRAPHY Left 08/24/2018   Procedure: LOWER EXTREMITY ANGIOGRAPHY;  Surgeon: Algernon Huxley, MD;  Location: [place] CV LAB;  Service: Cardiovascular;  Laterality: Left;  . LOWER EXTREMITY ANGIOGRAPHY Left 06/14/2019   Procedure: LOWER EXTREMITY ANGIOGRAPHY;  Surgeon: Algernon Huxley, MD;  Location: [place] CV LAB;  Service: Cardiovascular;  Laterality: Left;  . LOWER EXTREMITY ANGIOGRAPHY Left 08/02/2019   Procedure: LOWER EXTREMITY ANGIOGRAPHY;  Surgeon: Algernon Huxley, MD;  Location: [place] CV LAB;  Service: Cardiovascular;  Laterality:  Left;  . PERIPHERAL ARTERIAL STENT GRAFT     x2 left/right  . PERIPHERAL VASCULAR BALLOON ANGIOPLASTY Right 01/06/2018   Procedure: PERIPHERAL VASCULAR BALLOON ANGIOPLASTY;  Surgeon: Katha Cabal, MD;  Location: [place] CV LAB;  Service: Cardiovascular;  Laterality: Right;  . PERIPHERAL VASCULAR CATHETERIZATION N/A 07/06/2015   Procedure: Abdominal Aortogram w/Lower Extremity;  Surgeon: Algernon Huxley, MD;  Location: [place] CV LAB;  Service: Cardiovascular;  Laterality: N/A;  . PERIPHERAL VASCULAR CATHETERIZATION  07/06/2015   Procedure: Lower Extremity Intervention;  Surgeon: Algernon Huxley, MD;  Location: [place] CV LAB;  Service: Cardiovascular;;    Prior to Admission medications   Medication Sig Start Date End Date Taking? Authorizing Provider  albuterol (VENTOLIN  HFA) 108 (90 Base) MCG/ACT inhaler Inhale into the lungs every 6 (six) hours as needed for wheezing or shortness of breath.    [provider]  aspirin EC 81 MG tablet Take 1 tablet (81 mg total) by mouth daily. Patient not taking: Reported on 09/06/2019 05/11/15   Wellington Hampshire, MD  atorvastatin (LIPITOR) 80 MG tablet Take 80 mg by mouth daily.    [provider]  calcium carbonate (TUMS - DOSED IN MG ELEMENTAL CALCIUM) 500 MG chewable tablet Chew 4 tablets by mouth daily as needed for indigestion or heartburn.    [provider]  cephALEXin (KEFLEX) 500 MG capsule Take 1 capsule (500 mg total) by mouth 4 (four) times daily. 08/31/19   Minna Merritts, MD  clindamycin (CLEOCIN) 300 MG capsule Take by mouth. 05/24/19   [provider]  clopidogrel (PLAVIX) 75 MG tablet Take 75 mg by mouth daily.  08/22/17   [provider]  collagenase (SANTYL) ointment Apply 1 application topically daily. Patient not taking: Reported on 09/06/2019 08/11/19   Kris Hartmann, NP  doxycycline (VIBRAMYCIN) 100 MG capsule Take 1 capsule (100 mg total) by mouth 2 (two) times daily. 09/08/19   Kris Hartmann, NP  ENTRESTO 24-26 MG TAKE 1 TABLET BY MOUTH TWICE DAILY Patient not taking: Reported on 09/06/2019 06/19/19   Alisa Graff, FNP  ezetimibe (ZETIA) 10 MG tablet Take 1 tablet (10 mg total) by mouth daily. Patient not taking: Reported on 09/06/2019 05/19/19   Alisa Graff, FNP  gabapentin (NEURONTIN) 800 MG tablet Take 800 mg by mouth 4 (four) times daily.  08/14/17 09/06/19  [provider]  Insulin Glargine (LANTUS SOLOSTAR) 100 UNIT/ML Solostar Pen Inject 30 Units into the skin at bedtime. Patient taking differently: Inject 20-40 Units into the skin 2 (two) times daily. 40 units in the morning and 20 units at night 04/07/18   Mayo, Pete Pelt, MD  insulin lispro (HUMALOG) 100 UNIT/ML injection Sliding Scale Insulin before meals using Novolog If BS is 0 to 70 no  insulin and eat/glucose tablets/glucose get If BS 71 to 150 great! If BS 151 to 200, 2 Units Novolog If BS 201 to 250, 4 Units Novolog If BS 251 to 300, 6 Units Novolog If BS 301 to 350, 8 Units Novolog If BS 351 to 400, 10 Units Novolog If BS greater than 400, take 10 Units and Contact our office during office hour 04/14/18   [provider]  ipratropium-albuterol (DUONEB) 0.5-2.5 (3) MG/3ML SOLN Inhale 3 mLs into the lungs every 6 (six) hours as needed (wheezing/sob).     [provider]  isosorbide mononitrate (IMDUR) 30 MG 24 hr tablet Take 1 tablet (30 mg total) by mouth daily.  Patient not taking: Reported on 09/06/2019 05/19/19   Alisa Graff, FNP  metFORMIN (GLUCOPHAGE-XR) 500 MG 24 hr tablet Take 1,000 mg by mouth 2 (two) times daily.     [provider]  metoprolol succinate (TOPROL-XL) 25 MG 24 hr tablet Take 1 tablet (25 mg total) by mouth daily. Patient not taking: Reported on 09/06/2019 07/02/19 08/29/20  Loel Dubonnet, NP  Multiple Vitamin (MULTI-VITAMINS) TABS Take 1 tablet by mouth daily.     [provider]  naproxen (NAPROSYN) 500 MG tablet Take 1 tablet (500 mg total) by mouth 2 (two) times daily with a meal. Patient not taking: Reported on 09/06/2019 03/08/19   Lavonia Drafts, MD  nitroGLYCERIN (NITRODUR - DOSED IN MG/24 HR) 0.4 mg/hr patch Place 0.4 mg onto the skin daily.    [provider]  nitroGLYCERIN (NITROSTAT) 0.4 MG SL tablet Place 1 tablet (0.4 mg total) under the tongue every 5 (five) minutes as needed for chest pain. Patient not taking: Reported on 09/06/2019 05/19/19   Darylene Price A, FNP  Oxycodone HCl 10 MG TABS Take 10 mg by mouth every 6 (six) hours as needed (pain).  01/15/18   [provider]  potassium chloride (KLOR-CON) 10 MEQ tablet Take 1 tablet (10 mEq total) by mouth daily. Patient not taking: Reported on 09/06/2019 05/19/19   Alisa Graff, FNP  promethazine (PHENERGAN) 25 MG tablet TAKE 1 TABLET BY  MOUTH EVERY 6 HOURS AS NEEDED NAUSEA 04/29/18   [provider]  spironolactone (ALDACTONE) 25 MG tablet Take 25 mg by mouth daily.  03/02/18   [provider]  sulfamethoxazole-trimethoprim (BACTRIM DS) 800-160 MG tablet Take 1 tablet by mouth 2 (two) times daily. Patient not taking: Reported on 09/08/2019 07/22/19   Kris Hartmann, NP  torsemide (DEMADEX) 20 MG tablet Take 2 tablets (40 mg total) by mouth 2 (two) times daily. 40mg  AM/ 20mg  PM Patient taking differently: Take 40 mg by mouth 2 (two) times daily.  08/31/19   Minna Merritts, MD  traZODone (DESYREL) 150 MG tablet Take 150 mg by mouth at bedtime. 07/12/19   [provider]    Allergies Dulaglutide, Other, and Prednisone  Family History  Problem Relation Age of Onset  . Heart attack Father   . Heart disease Father   . Hypertension Father   . Hyperlipidemia Father   . Hypertension Mother   . Hyperlipidemia Mother     Social History Social History   Tobacco Use  . Smoking status: Current Every Day Smoker    Packs/day: 1.00    Years: 41.00    Pack years: 41.00    Types: Cigarettes  . Smokeless tobacco: Never Used  Substance Use Topics  . Alcohol use: No  . Drug use: No    Review of Systems Constitutional: No fever/chills Eyes: No visual changes. ENT: No sore throat. Cardiovascular: Denies chest pain. Respiratory: Positive for shortness of breath. Gastrointestinal: No abdominal pain.  No nausea, no vomiting.  No diarrhea.   Genitourinary: Negative for dysuria. Musculoskeletal: Positive for leg swelling and pain.  Skin: Negative for rash. Neurological: Negative for headaches, focal weakness or numbness.  ____________________________________________   PHYSICAL EXAM:  VITAL SIGNS: ED Triage Vitals [09/10/19 1548]  Enc Vitals Group     BP 117/81     Pulse Rate (!) 101     Resp 18     Temp 98.9 F (37.2 C)     Temp src  SpO2 100 %     Weight 217 lb (98.4 kg)     Height  5\' 7"  (1.702 m)     Head Circumference      Peak Flow      Pain Score 8   Constitutional: Alert and oriented.  Eyes: Conjunctivae are normal.  ENT      Head: Normocephalic and atraumatic.      Nose: No congestion/rhinnorhea.      Mouth/Throat: Mucous membranes are moist.      Neck: No stridor. Hematological/Lymphatic/Immunilogical: No cervical lymphadenopathy. Cardiovascular: Normal rate, regular rhythm.  No murmurs, rubs, or gallops.  Respiratory: Diffuse expiratory wheezing.  Gastrointestinal: Soft and non tender. No rebound. No guarding.  Genitourinary: Deferred Musculoskeletal: s/p right leg amputation. Left leg with some edema.  Neurologic:  Normal speech and language. No gross focal neurologic deficits are appreciated.  Skin:  Skin is warm, dry and intact. No rash noted. Psychiatric: Mood and affect are normal. Speech and behavior are normal. Patient exhibits appropriate insight and judgment.  ____________________________________________    LABS (pertinent positives/negatives)  Lactic 1.3 CBC wbc 5.7, hgb 12.0, plt 187 CMP na 128, k 4.3, gl 87, glu 116, cr 1.44  ____________________________________________   EKG  I, Nance Pear, attending physician, personally viewed and interpreted this EKG  EKG Time: 2027 Rate: 96 Rhythm: sinus rhythm Axis: left axis deviation Intervals: qtc 491 QRS: RBBB ST changes: no st elevation Impression: abnormal ekg  ____________________________________________    RADIOLOGY  CXR Cardiomegaly without acute abnormality  ____________________________________________   PROCEDURES  Procedures  ____________________________________________   INITIAL IMPRESSION / ASSESSMENT AND PLAN / ED COURSE  Pertinent labs & imaging results that were available during my care of the patient were reviewed by me and considered in my medical decision making (see chart for details).   Patient presents with concern for fluid overload. The  patient states he has not been taking his medications. On exam he does have some left lower leg edema. CXR without concerning findings. Patient was given IV lasix with good urine output. He stated that he did feel better after urination. At this time do think it is reasonable to discharge patient home. Will have him follow up with heart failure clinic. Discussed with patient importance of taking his medication.  ____________________________________________   FINAL CLINICAL IMPRESSION(S) / ED DIAGNOSES  Final diagnoses:  Congestive heart failure, unspecified HF chronicity, unspecified heart failure type [facility])     Note: This dictation was prepared with Dragon dictation. Any transcriptional errors that result from this process are unintentional     Nance Pear, MD 09/10/19 2257

## 2019-09-10 NOTE — Discharge Instructions (Addendum)
Please seek medical attention for any high fevers, chest pain, shortness of breath, change in behavior, persistent vomiting, bloody stool or any other new or concerning symptoms.  

## 2019-09-10 NOTE — ED Triage Notes (Signed)
FIRST NURSE NOTE- here for sores to leg and pt thinks he is septic. No fevers. NAD

## 2019-09-10 NOTE — ED Triage Notes (Addendum)
Pt comes via POV from home with c/o left leg pain. Pt states some swelling and pain. Pt states he was taking antibiotics with no improvement.  No fever at this time.   Pt also states fluid buildup in his back. Pt states this started two days ago

## 2019-09-13 ENCOUNTER — Encounter (INDEPENDENT_AMBULATORY_CARE_PROVIDER_SITE_OTHER): Payer: Self-pay | Admitting: Nurse Practitioner

## 2019-09-13 NOTE — Progress Notes (Signed)
Subjective:    Patient ID: [name] [name], male    DOB: 1960/12/14, 59 y.o.   MRN: [id number] Chief Complaint  Patient presents with  . Follow-up    ultrasound follow up    The patient returns to the office for followup and review of the noninvasive studies. There has been a significant deterioration in the lower extremity symptoms.  The patient states that he has been having some significant lower extremity swelling and redness.  Based on review of the patient's previous prescriptions he was given some Keflex by his cardiologist.  However, the patient notes that he has not been taking all of his medications as prescribed.  Patient states that his wife tried to help with his medications and things became mixed.  He endorses not taking his previously prescribed antibiotics as directed.  The patient has a diabetic ulcer on the medial portion of his left lower extremity which is actually improving.  However, the patient's left lower extremity is very edematous with signs of cellulitis.  There are multiple draining venous ulcerations.  The patient denies amaurosis fugax or recent TIA symptoms. There are no recent neurological changes noted. The patient denies history of DVT, PE or superficial thrombophlebitis. The patient denies recent episodes of angina or shortness of breath.   Duplex US of the lower extremity arterial system shows monophasic waveforms throughout the left lower extremity with patent stents previously placed on 08/02/2019.  The groin does have multiple enlarged lymph nodes however this is likely due to an infection of the lower extremity.   Review of Systems  Cardiovascular: Positive for leg swelling.  Musculoskeletal: Positive for gait problem.  Skin: Positive for color change and wound.       cellulitis  All other systems reviewed and are negative.      Objective:   Physical Exam Vitals reviewed.  HENT:     Head: Normocephalic.  Pulmonary:     Effort: Pulmonary effort  is normal.  Musculoskeletal:     Left lower leg: 2+ Pitting Edema present.       Feet:     Right Lower Extremity: Right leg is amputated below knee.  Feet:     Left foot:     Skin integrity: Ulcer present.  Skin:    General: Skin is warm.     Findings: Erythema (LLE) and wound present.  Neurological:     General: No focal deficit present.     Mental Status: He is alert.  Psychiatric:        Mood and Affect: Mood normal.        Behavior: Behavior normal.     BP 101/67 (BP Location: Right Arm)   Pulse (!) 108   Resp 16   Wt 217 lb (98.4 kg)   BMI 33.99 kg/m   Past Medical History:  Diagnosis Date  . Arthritis   . Asthma   . Atherosclerosis   . BPH (benign prostatic hyperplasia)   . Carotid arterial disease ([place])   . Charcot's joint of foot, right   . Chronic combined systolic (congestive) and diastolic (congestive) heart failure ([place])    a. 02/2013 EF 50% by LV gram; b. 04/2017 Echo: EF 25-30%. diff HK. Gr2 DD. Mod dil LA/RV. PASP 79mmHg.  [name] COPD (chronic obstructive pulmonary disease) ([place])   . Coronary artery disease    a. 2013 S/P PCI of LAD [facility]);  b. 03/2013 PCI: RCA 90p ( 2.5 x 23 mm DES); c. 02/2014 Cath: patent  RCA stent->Med Rx; d. 04/2017 Cath: LM nl, LAd 30ost/p, 51m/d, D1 80ost, LCX 90p/m, OM1 85, RCA 70/20p, 34m, 70d->Referred for CT Surg-felt to be poor candidate.  . Diabetic neuropathy ([place])   . Diabetic ulcer of right foot ([place])   . Hernia   . Hyperlipidemia   . Hypertension   . Ischemic cardiomyopathy    a. 04/2017 Echo: EF 25-30%; b. 08/2017 Cardiac MRI ([facility]): EF 19%, sev glob HK. RVEF 20%, mod BAE, triv MR, mild-mod TR. Basal lateral subendocardial infarct (viable), inf/infsept, dist septal ischemia, basal to mid lat peri-infarct ischemia.  . Morbid obesity ([place])   . Neuropathy   . PAD (peripheral artery disease) ([place])    a. Followed by Dr. Lucky Cowboy; b. 08/2010 Periph Angio: RSFA 70-80p (6X60 self-expanding stent); c. 09/2010 Periph Angio: L SFA 100p (7x  unknown length self-expanding stent); d. 06/2015 Periph Angio: R SFA short segment occlusion (6x12 self-expanding stent).  . Restless leg syndrome   . Sleep apnea   . Subclavian artery stenosis, right ([place])   . Syncope and collapse   . Tobacco use    a. 75+ yr hx - still smoking 1ppd, down from 2 ppd.  . Type II diabetes mellitus ([place])   . Varicose vein     Social History   Socioeconomic History  . Marital status: Single    Spouse name: Not on file  . Number of children: 0  . Years of education: 37  . Highest education level: 12th grade  Occupational History  . Occupation: on disability  Tobacco Use  . Smoking status: Current Every Day Smoker    Packs/day: 1.00    Years: 41.00    Pack years: 41.00    Types: Cigarettes  . Smokeless tobacco: Never Used  Substance and Sexual Activity  . Alcohol use: No  . Drug use: No  . Sexual activity: Yes  Other Topics Concern  . Not on file  Social History Narrative   Lives at home in [place] with girlfriend. Independent at baseline.   Social Determinants of Health   Financial Resource Strain:   . Difficulty of Paying Living Expenses:   Food Insecurity:   . Worried About Charity fundraiser in the Last Year:   . Arboriculturist in the Last Year:   Transportation Needs:   . Film/video editor (Medical):   [name] Lack of Transportation (Non-Medical):   Physical Activity:   . Days of Exercise per Week:   . Minutes of Exercise per Session:   Stress:   . Feeling of Stress :   Social Connections:   . Frequency of Communication with Friends and Family:   . Frequency of Social Gatherings with Friends and Family:   . Attends Religious Services:   . Active Member of Clubs or Organizations:   . Attends Archivist Meetings:   [name] Marital Status:   Intimate Partner Violence:   . Fear of Current or Ex-Partner:   . Emotionally Abused:   [name] Physically Abused:   . Sexually Abused:     Past Surgical History:  Procedure  Laterality Date  . ABDOMINAL AORTAGRAM N/A 06/23/2013   Procedure: ABDOMINAL Maxcine Ham;  Surgeon: Wellington Hampshire, MD;  Location: [place] CATH [facility];  Service: Cardiovascular;  Laterality: N/A;  . AMPUTATION Right 04/01/2018   Procedure: AMPUTATION BELOW KNEE;  Surgeon: Algernon Huxley, MD;  Location: [facility] ORS;  Service: Vascular;  Laterality: Right;  . APPENDECTOMY    . CARDIAC  CATHETERIZATION  10/14   [facility]; X1 STENT PROXIMAL RCA  . CARDIAC CATHETERIZATION  02/2010   [facility]  . CARDIAC CATHETERIZATION  02/21/2014   [facility]  . CLAVICLE SURGERY    . HERNIA REPAIR    . IRRIGATION AND DEBRIDEMENT FOOT Right 01/04/2018   Procedure: IRRIGATION AND DEBRIDEMENT FOOT;  Surgeon: Samara Deist, DPM;  Location: [facility] ORS;  Service: Podiatry;  Laterality: Right;  . LEFT HEART CATH AND CORS/GRAFTS ANGIOGRAPHY N/A 04/28/2017   Procedure: LEFT HEART CATH AND CORONARY ANGIOGRAPHY;  Surgeon: Wellington Hampshire, MD;  Location: [place] CV LAB;  Service: Cardiovascular;  Laterality: N/A;  . LOWER EXTREMITY ANGIOGRAPHY Right 03/03/2018   Procedure: LOWER EXTREMITY ANGIOGRAPHY;  Surgeon: Katha Cabal, MD;  Location: [place] CV [facility];  Service: Cardiovascular;  Laterality: Right;  . LOWER EXTREMITY ANGIOGRAPHY Left 08/24/2018   Procedure: LOWER EXTREMITY ANGIOGRAPHY;  Surgeon: Algernon Huxley, MD;  Location: [place] CV [facility];  Service: Cardiovascular;  Laterality: Left;  . LOWER EXTREMITY ANGIOGRAPHY Left 06/14/2019   Procedure: LOWER EXTREMITY ANGIOGRAPHY;  Surgeon: Algernon Huxley, MD;  Location: [place] CV [facility];  Service: Cardiovascular;  Laterality: Left;  . LOWER EXTREMITY ANGIOGRAPHY Left 08/02/2019   Procedure: LOWER EXTREMITY ANGIOGRAPHY;  Surgeon: Algernon Huxley, MD;  Location: [place] [facility];  Service: Cardiovascular;  Laterality: Left;  . PERIPHERAL ARTERIAL STENT GRAFT     x2 left/right  . PERIPHERAL VASCULAR BALLOON ANGIOPLASTY Right 01/06/2018   Procedure: PERIPHERAL VASCULAR BALLOON ANGIOPLASTY;   Surgeon: Katha Cabal, MD;  Location: [place] CV [facility];  Service: Cardiovascular;  Laterality: Right;  . PERIPHERAL VASCULAR CATHETERIZATION N/A 07/06/2015   Procedure: Abdominal Aortogram w/Lower Extremity;  Surgeon: Algernon Huxley, MD;  Location: [place] [facility];  Service: Cardiovascular;  Laterality: N/A;  . PERIPHERAL VASCULAR CATHETERIZATION  07/06/2015   Procedure: Lower Extremity Intervention;  Surgeon: Algernon Huxley, MD;  Location: [place] CV LAB;  Service: Cardiovascular;;    Family History  Problem Relation Age of Onset  . Heart attack Father   . Heart disease Father   . Hypertension Father   . Hyperlipidemia Father   . Hypertension Mother   . Hyperlipidemia Mother     Allergies  Allergen Reactions  . Dulaglutide Anaphylaxis, Diarrhea and Hives  . Other Itching    Skin itching associated with nitro patch  . Prednisone Rash       Assessment & Plan:   1. Cellulitis of left lower extremity Patient is advised to stop all antibiotics that were previously prescribed.  This is due to the fact that the patient is not entirely certain as to which ones he is taking and how often.  We will also prescribe doxycycline for the patient.  100 mg twice a day for the next 10 days.  Patient is advised that if the redness should come above the level of his inner wraps he should report to the emergency room over the weekend.  The patient was replaced in an [place] wrap of the left lower extremity.  This will be changed on a weekly basis strengthening.  We will also have an open states that the patient continue with wound dressings of his ulceration.  Patient will follow up in 4 weeks for evaluation of wound healing.  2. PAD (peripheral artery disease) ([place]) Based on patient's noninvasive studies today we will not need to intervene.  Patient is advised to continue taking his aspirin and Plavix as prescribed.  3. Diabetic  foot infection [facility]) Patient is diabetic wound infection is  looking much better.  The wound is becoming more shallow with more granulation tissue present.  The patient is advised to continue current wound dressings.  The patient is fighting Santyl in the wound bed followed by wet-to-dry dressing changes twice a day.  4. Essential hypertension Continue antihypertensive medications as already ordered, these medications have been reviewed and there are no changes at this time.    Current Outpatient Medications on File Prior to Visit  Medication Sig Dispense Refill  . albuterol (VENTOLIN HFA) 108 (90 Base) MCG/ACT inhaler Inhale into the lungs every 6 (six) hours as needed for wheezing or shortness of breath.    [name] aspirin EC 81 MG tablet Take 1 tablet (81 mg total) by mouth daily. (Patient not taking: Reported on 09/06/2019) 90 tablet 3  . atorvastatin (LIPITOR) 80 MG tablet Take 80 mg by mouth daily.    . calcium carbonate (TUMS - DOSED IN MG ELEMENTAL CALCIUM) 500 MG chewable tablet Chew 4 tablets by mouth daily as needed for indigestion or heartburn.    . cephALEXin (KEFLEX) 500 MG capsule Take 1 capsule (500 mg total) by mouth 4 (four) times daily. 40 capsule 0  . clindamycin (CLEOCIN) 300 MG capsule Take by mouth.    . clopidogrel (PLAVIX) 75 MG tablet Take 75 mg by mouth daily.     . collagenase (SANTYL) ointment Apply 1 application topically daily. (Patient not taking: Reported on 09/06/2019) 90 g 1  . doxycycline (VIBRAMYCIN) 100 MG capsule Take 1 capsule (100 mg total) by mouth 2 (two) times daily. 20 capsule 0  . ENTRESTO 24-26 MG TAKE 1 TABLET BY MOUTH TWICE DAILY (Patient not taking: Reported on 09/06/2019) 60 tablet 3  . ezetimibe (ZETIA) 10 MG tablet Take 1 tablet (10 mg total) by mouth daily. (Patient not taking: Reported on 09/06/2019) 30 tablet 5  . gabapentin (NEURONTIN) 800 MG tablet Take 800 mg by mouth 4 (four) times daily.     . Insulin Glargine (LANTUS SOLOSTAR) 100 UNIT/ML Solostar Pen Inject 30 Units into the skin at bedtime. (Patient  taking differently: Inject 20-40 Units into the skin 2 (two) times daily. 40 units in the morning and 20 units at night) 15 mL 11  . insulin lispro (HUMALOG) 100 UNIT/ML injection Sliding Scale Insulin before meals using Novolog If BS is 0 to 70 no insulin and eat/glucose tablets/glucose get If BS 71 to 150 great! If BS 151 to 200, 2 Units Novolog If BS 201 to 250, 4 Units Novolog If BS 251 to 300, 6 Units Novolog If BS 301 to 350, 8 Units Novolog If BS 351 to 400, 10 Units Novolog If BS greater than 400, take 10 Units and Contact our office during office hour    . ipratropium-albuterol (DUONEB) 0.5-2.5 (3) MG/3ML SOLN Inhale 3 mLs into the lungs every 6 (six) hours as needed (wheezing/sob).     . isosorbide mononitrate (IMDUR) 30 MG 24 hr tablet Take 1 tablet (30 mg total) by mouth daily. (Patient not taking: Reported on 09/06/2019) 30 tablet 5  . metFORMIN (GLUCOPHAGE-XR) 500 MG 24 hr tablet Take 1,000 mg by mouth 2 (two) times daily.     . metoprolol succinate (TOPROL-XL) 25 MG 24 hr tablet Take 1 tablet (25 mg total) by mouth daily. (Patient not taking: Reported on 09/06/2019) 15 tablet 5  . Multiple Vitamin (MULTI-VITAMINS) TABS Take 1 tablet by mouth daily.     . naproxen (NAPROSYN)  500 MG tablet Take 1 tablet (500 mg total) by mouth 2 (two) times daily with a meal. (Patient not taking: Reported on 09/06/2019) 20 tablet 2  . nitroGLYCERIN (NITRODUR - DOSED IN MG/24 HR) 0.4 mg/hr patch Place 0.4 mg onto the skin daily.    . nitroGLYCERIN (NITROSTAT) 0.4 MG SL tablet Place 1 tablet (0.4 mg total) under the tongue every 5 (five) minutes as needed for chest pain. (Patient not taking: Reported on 09/06/2019) 30 tablet 5  . Oxycodone HCl 10 MG TABS Take 10 mg by mouth every 6 (six) hours as needed (pain).     . potassium chloride (KLOR-CON) 10 MEQ tablet Take 1 tablet (10 mEq total) by mouth daily. (Patient not taking: Reported on 09/06/2019) 30 tablet 5  . promethazine (PHENERGAN) 25 MG tablet TAKE 1 TABLET  BY MOUTH EVERY 6 HOURS AS NEEDED NAUSEA    . spironolactone (ALDACTONE) 25 MG tablet Take 25 mg by mouth daily.     [name] sulfamethoxazole-trimethoprim (BACTRIM DS) 800-160 MG tablet Take 1 tablet by mouth 2 (two) times daily. (Patient not taking: Reported on 09/08/2019) 28 tablet 0  . torsemide (DEMADEX) 20 MG tablet Take 2 tablets (40 mg total) by mouth 2 (two) times daily. 40mg  AM/ 20mg  PM (Patient taking differently: Take 40 mg by mouth 2 (two) times daily. ) 180 tablet 3  . traZODone (DESYREL) 150 MG tablet Take 150 mg by mouth at bedtime.     No current facility-administered medications on file prior to visit.    There are no Patient Instructions on file for this visit. No follow-ups on file.   Kris Hartmann, NP

## 2019-09-14 NOTE — Progress Notes (Signed)
Patient ID: [name] [name], male    DOB: 1960-08-09, 59 y.o.   MRN: [id number]  HPI  [name] [name] is a 59 y/o male with a history of obstructive sleep apnea, PVD, PAD, HTN, hyperlipidemia, DM, CAD, COPD, BPH, asthma, current tobacco use and chronic heart failure.   Echo report from 11/06/2018 reviewed and showed an EF of 20-25% along with moderate TR and a PA pressure of 59.6 mmHg. Echo report from 04/25/17 reviewed and shows an EF of 25-30% along with a PA pressure of 34 mm Hg. EF has declined from 55-60% back in 2016.   Cardiac catheterization done 04/28/17 showed significant three-vessel disease with a patient stent in the RCA and LAD. Significant proximal RCS disease and the stent as well as diffuse mid and distal disease. LAD has moderate disease. Severely elevated left ventricular end-diastolic pressure at 34 mmHg. Possible CABG in the future with optimizing medical management. Stress done in 2015.  Was in the ED 09/10/19 due to pedal edema. Admits to not taking his medications. Given IV lasix with improvement in symptoms and he was released. Was in the ED 3/22/210 due to HF exacerbation. Nebulizer given for wheezing. CT and [place] were negative for clots. Mildly elevated troponin thought to be due to demand ischemia. IV antibiotics given for LLL cellulitis. Patient left AMA  He presents today for a follow-up visit with a chief complaint of moderate shortness of breath upon minimal exertion. He describes this as chronic in nature having been present for several years and seems to be worse since his recent ED visit. He has associated fatigue, cough, wheezing, pedal edema, abdominal distention and weight gain along with this. He denies any difficulty sleeping, dizziness, palpitations or chest pain.    He says that he hasn't taken any of his medications yet today because he was afraid that he was going to be late to his appointment   Past Medical History:  Diagnosis Date  . Arthritis   . Asthma   .  Atherosclerosis   . BPH (benign prostatic hyperplasia)   . Carotid arterial disease ([place])   . Charcot's joint of foot, right   . Chronic combined systolic (congestive) and diastolic (congestive) heart failure ([place])    a. 02/2013 EF 50% by LV gram; b. 04/2017 Echo: EF 25-30%. diff HK. Gr2 DD. Mod dil LA/RV. PASP 34mmHg.  [name] COPD (chronic obstructive pulmonary disease) ([place])   . Coronary artery disease    a. 2013 S/P PCI of LAD [facility]);  b. 03/2013 PCI: RCA 90p ( 2.5 x 23 mm DES); c. 02/2014 Cath: patent RCA stent->Med Rx; d. 04/2017 Cath: LM nl, LAd 30ost/p, 26m/d, D1 80ost, LCX 90p/m, OM1 85, RCA 70/20p, 73m, 70d->Referred for CT Surg-felt to be poor candidate.  . Diabetic neuropathy ([place])   . Diabetic ulcer of right foot ([place])   . Hernia   . Hyperlipidemia   . Hypertension   . Ischemic cardiomyopathy    a. 04/2017 Echo: EF 25-30%; b. 08/2017 Cardiac MRI ([facility]): EF 19%, sev glob HK. RVEF 20%, mod BAE, triv [name], mild-mod TR. Basal lateral subendocardial infarct (viable), inf/infsept, dist septal ischemia, basal to mid lat peri-infarct ischemia.  . Morbid obesity ([place])   . Neuropathy   . PAD (peripheral artery disease) ([place])    a. Followed by Dr. Lucky Cowboy; b. 08/2010 Periph Angio: RSFA 70-80p (6X60 self-expanding stent); c. 09/2010 Periph Angio: L SFA 100p (7x unknown length self-expanding stent); d. 06/2015 Periph Angio: R SFA short segment  occlusion (6x12 self-expanding stent).  . Restless leg syndrome   . Sleep apnea   . Subclavian artery stenosis, right ([place])   . Syncope and collapse   . Tobacco use    a. 75+ yr hx - still smoking 1ppd, down from 2 ppd.  . Type II diabetes mellitus ([place])   . Varicose vein    Past Surgical History:  Procedure Laterality Date  . ABDOMINAL AORTAGRAM N/A 06/23/2013   Procedure: ABDOMINAL Maxcine Ham;  Surgeon: Wellington Hampshire, MD;  Location: [place] CATH [facility];  Service: Cardiovascular;  Laterality: N/A;  . AMPUTATION Right 04/01/2018   Procedure: AMPUTATION BELOW KNEE;   Surgeon: Algernon Huxley, MD;  Location: [facility] ORS;  Service: Vascular;  Laterality: Right;  . APPENDECTOMY    . CARDIAC CATHETERIZATION  10/14   [facility]; X1 STENT PROXIMAL RCA  . CARDIAC CATHETERIZATION  02/2010   [facility]  . CARDIAC CATHETERIZATION  02/21/2014   [facility]  . CLAVICLE SURGERY    . HERNIA REPAIR    . IRRIGATION AND DEBRIDEMENT FOOT Right 01/04/2018   Procedure: IRRIGATION AND DEBRIDEMENT FOOT;  Surgeon: Samara Deist, DPM;  Location: [facility] ORS;  Service: Podiatry;  Laterality: Right;  . LEFT HEART CATH AND CORS/GRAFTS ANGIOGRAPHY N/A 04/28/2017   Procedure: LEFT HEART CATH AND CORONARY ANGIOGRAPHY;  Surgeon: Wellington Hampshire, MD;  Location: [place] CV LAB;  Service: Cardiovascular;  Laterality: N/A;  . LOWER EXTREMITY ANGIOGRAPHY Right 03/03/2018   Procedure: LOWER EXTREMITY ANGIOGRAPHY;  Surgeon: Katha Cabal, MD;  Location: [place] CV LAB;  Service: Cardiovascular;  Laterality: Right;  . LOWER EXTREMITY ANGIOGRAPHY Left 08/24/2018   Procedure: LOWER EXTREMITY ANGIOGRAPHY;  Surgeon: Algernon Huxley, MD;  Location: [place] CV LAB;  Service: Cardiovascular;  Laterality: Left;  . LOWER EXTREMITY ANGIOGRAPHY Left 06/14/2019   Procedure: LOWER EXTREMITY ANGIOGRAPHY;  Surgeon: Algernon Huxley, MD;  Location: [place] CV [facility];  Service: Cardiovascular;  Laterality: Left;  . LOWER EXTREMITY ANGIOGRAPHY Left 08/02/2019   Procedure: LOWER EXTREMITY ANGIOGRAPHY;  Surgeon: Algernon Huxley, MD;  Location: [place] [facility];  Service: Cardiovascular;  Laterality: Left;  . PERIPHERAL ARTERIAL STENT GRAFT     x2 left/right  . PERIPHERAL VASCULAR BALLOON ANGIOPLASTY Right 01/06/2018   Procedure: PERIPHERAL VASCULAR BALLOON ANGIOPLASTY;  Surgeon: Katha Cabal, MD;  Location: [place] CV [facility];  Service: Cardiovascular;  Laterality: Right;  . PERIPHERAL VASCULAR CATHETERIZATION N/A 07/06/2015   Procedure: Abdominal Aortogram w/Lower Extremity;  Surgeon: Algernon Huxley, MD;  Location:  [place] CV [facility];  Service: Cardiovascular;  Laterality: N/A;  . PERIPHERAL VASCULAR CATHETERIZATION  07/06/2015   Procedure: Lower Extremity Intervention;  Surgeon: Algernon Huxley, MD;  Location: [place] CV [facility];  Service: Cardiovascular;;   Family History  Problem Relation Age of Onset  . Heart attack Father   . Heart disease Father   . Hypertension Father   . Hyperlipidemia Father   . Hypertension Mother   . Hyperlipidemia Mother    Social History   Tobacco Use  . Smoking status: Current Every Day Smoker    Packs/day: 1.00    Years: 41.00    Pack years: 41.00    Types: Cigarettes  . Smokeless tobacco: Never Used  Substance Use Topics  . Alcohol use: No   Allergies  Allergen Reactions  . Dulaglutide Anaphylaxis, Diarrhea and Hives  . Other Itching    Skin itching associated with nitro patch  . Prednisone Rash   Prior to Admission  medications   Medication Sig Start Date End Date Taking? Authorizing Provider  albuterol (VENTOLIN HFA) 108 (90 Base) MCG/ACT inhaler Inhale into the lungs every 6 (six) hours as needed for wheezing or shortness of breath.   Yes [provider]  aspirin EC 81 MG tablet Take 1 tablet (81 mg total) by mouth daily. 05/11/15  Yes Wellington Hampshire, MD  atorvastatin (LIPITOR) 80 MG tablet Take 80 mg by mouth daily.   Yes [provider]  calcium carbonate (TUMS - DOSED IN MG ELEMENTAL CALCIUM) 500 MG chewable tablet Chew 4 tablets by mouth daily as needed for indigestion or heartburn.   Yes [provider]  cephALEXin (KEFLEX) 500 MG capsule Take 1 capsule (500 mg total) by mouth 4 (four) times daily. 08/31/19  Yes Minna Merritts, MD  clindamycin (CLEOCIN) 300 MG capsule Take by mouth. 05/24/19  Yes [provider]  clopidogrel (PLAVIX) 75 MG tablet Take 75 mg by mouth daily.  08/22/17  Yes [provider]  collagenase (SANTYL) ointment Apply 1 application topically daily. 08/11/19  Yes Kris Hartmann, NP   doxycycline (VIBRAMYCIN) 100 MG capsule Take 1 capsule (100 mg total) by mouth 2 (two) times daily. 09/08/19  Yes Kris Hartmann, NP  ENTRESTO 24-26 MG TAKE 1 TABLET BY MOUTH TWICE DAILY 06/19/19  Yes Darylene Price A, FNP  ezetimibe (ZETIA) 10 MG tablet Take 1 tablet (10 mg total) by mouth daily. 05/19/19  Yes Kaityln Kallstrom, Otila Kluver A, FNP  Insulin Glargine (LANTUS SOLOSTAR) 100 UNIT/ML Solostar Pen Inject 30 Units into the skin at bedtime. Patient taking differently: Inject 20-40 Units into the skin 2 (two) times daily. 40 units in the morning and 20 units at night 04/07/18  Yes Mayo, Pete Pelt, MD  insulin lispro (HUMALOG) 100 UNIT/ML injection Sliding Scale Insulin before meals using Novolog If BS is 0 to 70 no insulin and eat/glucose tablets/glucose get If BS 71 to 150 great! If BS 151 to 200, 2 Units Novolog If BS 201 to 250, 4 Units Novolog If BS 251 to 300, 6 Units Novolog If BS 301 to 350, 8 Units Novolog If BS 351 to 400, 10 Units Novolog If BS greater than 400, take 10 Units and Contact our office during office hour 04/14/18  Yes [provider]  ipratropium-albuterol (DUONEB) 0.5-2.5 (3) MG/3ML SOLN Inhale 3 mLs into the lungs every 6 (six) hours as needed (wheezing/sob).    Yes [provider]  isosorbide mononitrate (IMDUR) 30 MG 24 hr tablet Take 1 tablet (30 mg total) by mouth daily. 05/19/19  Yes Arlander Gillen, Otila Kluver A, FNP  metFORMIN (GLUCOPHAGE-XR) 500 MG 24 hr tablet Take 1,000 mg by mouth 2 (two) times daily.    Yes [provider]  Multiple Vitamin (MULTI-VITAMINS) TABS Take 1 tablet by mouth daily.    Yes [provider]  nitroGLYCERIN (NITRODUR - DOSED IN MG/24 HR) 0.4 mg/hr patch Place 0.4 mg onto the skin daily.   Yes [provider]  Oxycodone HCl 10 MG TABS Take 10 mg by mouth every 6 (six) hours as needed (pain).  01/15/18  Yes [provider]  promethazine (PHENERGAN) 25 MG tablet TAKE 1 TABLET BY MOUTH EVERY 6 HOURS AS NEEDED NAUSEA 04/29/18   Yes [provider]  spironolactone (ALDACTONE) 25 MG tablet Take 25 mg by mouth daily.  03/02/18  Yes [provider]  torsemide (DEMADEX) 20 MG tablet Take 2 tablets (40 mg total) by mouth 2 (two) times daily.  40mg  AM/ 20mg  PM Patient taking differently: Take 40 mg by mouth 2 (two) times daily.  08/31/19  Yes Minna Merritts, MD  traZODone (DESYREL) 150 MG tablet Take 150 mg by mouth at bedtime. 07/12/19  Yes [provider]  gabapentin (NEURONTIN) 800 MG tablet Take 800 mg by mouth 4 (four) times daily.  08/14/17 09/06/19  [provider]  metoprolol succinate (TOPROL-XL) 25 MG 24 hr tablet Take 1 tablet (25 mg total) by mouth daily. Patient not taking: Reported on 09/15/2019 07/02/19 08/29/20  Loel Dubonnet, NP  nitroGLYCERIN (NITROSTAT) 0.4 MG SL tablet Place 1 tablet (0.4 mg total) under the tongue every 5 (five) minutes as needed for chest pain. Patient not taking: Reported on 09/06/2019 05/19/19   Darylene Price A, FNP  potassium chloride (KLOR-CON) 10 MEQ tablet Take 1 tablet (10 mEq total) by mouth daily. Patient not taking: Reported on 09/06/2019 05/19/19   Alisa Graff, FNP  sulfamethoxazole-trimethoprim (BACTRIM DS) 800-160 MG tablet Take 1 tablet by mouth 2 (two) times daily. Patient not taking: Reported on 09/08/2019 07/22/19   Kris Hartmann, NP    Review of Systems  Constitutional: Positive for fatigue (minimal). Negative for appetite change.  HENT: Positive for rhinorrhea. Negative for congestion, postnasal drip and sore throat.   Eyes: Negative.   Respiratory: Positive for cough, shortness of breath (upon exertion) and wheezing. Negative for chest tightness.   Cardiovascular: Positive for leg swelling (left leg). Negative for chest pain and palpitations.  Gastrointestinal: Positive for abdominal distention. Negative for abdominal pain.  Endocrine: Negative.   Genitourinary: Negative.   Musculoskeletal: Positive for back pain. Negative for neck  pain.       Has right lower leg prosthesis  Skin: Positive for wound (left foot).  Allergic/Immunologic: Negative.   Neurological: Positive for headaches. Negative for dizziness and light-headedness.  Hematological: Negative for adenopathy. Bruises/bleeds easily.  Psychiatric/Behavioral: Negative for dysphoric mood and sleep disturbance (sleeping on 2 pillows). The patient is not nervous/anxious.    Vitals:   09/15/19 1302  BP: 109/78  Pulse: 94  Resp: 16  SpO2: 95%  Weight: 223 lb 6 oz (101.3 kg)  Height: 5\' 7"  (1.702 m)   Wt Readings from Last 3 Encounters:  09/15/19 223 lb 6 oz (101.3 kg)  09/10/19 217 lb (98.4 kg)  09/08/19 217 lb (98.4 kg)   Lab Results  Component Value Date   CREATININE 1.44 (H) 09/10/2019   CREATININE 0.83 08/30/2019   CREATININE 1.01 08/02/2019     Physical Exam  Constitutional: He is oriented to person, place, and time. He appears well-developed and well-nourished.  HENT:  Head: Normocephalic and atraumatic.  Neck: No JVD present.  Cardiovascular: Normal rate and regular rhythm.  Pulmonary/Chest: Effort normal. He has no wheezes. He has rhonchi in the right upper field, the right lower field, the left upper field and the left lower field. He has rales in the left lower field.  Abdominal: Soft. He exhibits no distension. There is no abdominal tenderness.  Musculoskeletal:        General: Edema (2+ pitting edema in left lower leg ) present. No tenderness.     Cervical back: Normal range of motion and neck supple.  Neurological: He is alert and oriented to person, place, and time.  Skin: Skin is warm and dry. There is erythema (left anterior shin/ shiny in appearance).  Psychiatric: He has a normal mood and affect. His behavior is normal. Thought content normal.  Nursing note  and vitals reviewed.   Assessment & Plan:  1: Acute on Chronic heart failure with reduced ejection fraction- - NYHA class III - moderately fluid overloaded today - not  weighing daily; encouraged to resume so that he could call for an overnight weight gain of >2 pounds or a weekly weight gain of >5 pounds - weight up 6 pounds since an office visit at vascular one week ago - says that he's no better since recent ED visit - will send patient for 80mg  IV lasix/ 8meq PO potassium - BMP/BNP to be drawn - not adding salt and has been using Mrs. Dash & trying to read food labels.  - drinking ~ 64 ounces of fluid daily (Mtn Dew, pepsi, water) - saw cardiologist Rockey Situ) 08/31/19  - BNP 08/30/19 was 2054.0 - says that he does not take the flu vaccine; good handwashing encouraged  2: HTN- - BP looks good today - saw PCP Dr. 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 Each medication was verbally reviewed with the patient and he was encouraged to bring the bottles to every visit to confirm accuracy of list.  Return in 2 days for a recheck of symptoms. Should symptoms worsen, he is to present to the ED.

## 2019-09-15 ENCOUNTER — Other Ambulatory Visit: Payer: Self-pay | Admitting: Family

## 2019-09-15 ENCOUNTER — Ambulatory Visit: Payer: Medicare Other | Admitting: Family

## 2019-09-15 ENCOUNTER — Ambulatory Visit
Admission: RE | Admit: 2019-09-15 | Discharge: 2019-09-15 | Disposition: A | Discharge status: Home / Self Care | Payer: Medicare Other | Source: Ambulatory Visit | Attending: Family | Admitting: Family

## 2019-09-15 ENCOUNTER — Other Ambulatory Visit: Payer: Self-pay

## 2019-09-15 ENCOUNTER — Encounter: Payer: Self-pay | Admitting: Family

## 2019-09-15 VITALS — BP 109/78 | HR 94 | Resp 16 | Ht 67.0 in | Wt 223.4 lb

## 2019-09-15 DIAGNOSIS — Z955 Presence of coronary angioplasty implant and graft: Secondary | ICD-10-CM | POA: Insufficient documentation

## 2019-09-15 DIAGNOSIS — I252 Old myocardial infarction: Secondary | ICD-10-CM | POA: Diagnosis not present

## 2019-09-15 DIAGNOSIS — E1140 Type 2 diabetes mellitus with diabetic neuropathy, unspecified: Secondary | ICD-10-CM | POA: Diagnosis not present

## 2019-09-15 DIAGNOSIS — J449 Chronic obstructive pulmonary disease, unspecified: Secondary | ICD-10-CM | POA: Insufficient documentation

## 2019-09-15 DIAGNOSIS — Z79899 Other long term (current) drug therapy: Secondary | ICD-10-CM | POA: Insufficient documentation

## 2019-09-15 DIAGNOSIS — I110 Hypertensive heart disease with heart failure: Secondary | ICD-10-CM | POA: Diagnosis not present

## 2019-09-15 DIAGNOSIS — E785 Hyperlipidemia, unspecified: Secondary | ICD-10-CM | POA: Insufficient documentation

## 2019-09-15 DIAGNOSIS — I5043 Acute on chronic combined systolic (congestive) and diastolic (congestive) heart failure: Secondary | ICD-10-CM | POA: Insufficient documentation

## 2019-09-15 DIAGNOSIS — Z794 Long term (current) use of insulin: Secondary | ICD-10-CM | POA: Insufficient documentation

## 2019-09-15 DIAGNOSIS — I255 Ischemic cardiomyopathy: Secondary | ICD-10-CM | POA: Diagnosis not present

## 2019-09-15 DIAGNOSIS — I5042 Chronic combined systolic (congestive) and diastolic (congestive) heart failure: Secondary | ICD-10-CM | POA: Diagnosis not present

## 2019-09-15 DIAGNOSIS — I5033 Acute on chronic diastolic (congestive) heart failure: Secondary | ICD-10-CM

## 2019-09-15 DIAGNOSIS — I70219 Atherosclerosis of native arteries of extremities with intermittent claudication, unspecified extremity: Secondary | ICD-10-CM

## 2019-09-15 DIAGNOSIS — I2510 Atherosclerotic heart disease of native coronary artery without angina pectoris: Secondary | ICD-10-CM | POA: Insufficient documentation

## 2019-09-15 DIAGNOSIS — N400 Enlarged prostate without lower urinary tract symptoms: Secondary | ICD-10-CM | POA: Diagnosis not present

## 2019-09-15 DIAGNOSIS — F17210 Nicotine dependence, cigarettes, uncomplicated: Secondary | ICD-10-CM | POA: Insufficient documentation

## 2019-09-15 DIAGNOSIS — I70203 Unspecified atherosclerosis of native arteries of extremities, bilateral legs: Secondary | ICD-10-CM | POA: Diagnosis not present

## 2019-09-15 DIAGNOSIS — E1151 Type 2 diabetes mellitus with diabetic peripheral angiopathy without gangrene: Secondary | ICD-10-CM | POA: Insufficient documentation

## 2019-09-15 DIAGNOSIS — Z89511 Acquired absence of right leg below knee: Secondary | ICD-10-CM | POA: Diagnosis not present

## 2019-09-15 DIAGNOSIS — G4733 Obstructive sleep apnea (adult) (pediatric): Secondary | ICD-10-CM | POA: Insufficient documentation

## 2019-09-15 DIAGNOSIS — Z7982 Long term (current) use of aspirin: Secondary | ICD-10-CM | POA: Insufficient documentation

## 2019-09-15 DIAGNOSIS — Z720 Tobacco use: Secondary | ICD-10-CM

## 2019-09-15 DIAGNOSIS — I10 Essential (primary) hypertension: Secondary | ICD-10-CM

## 2019-09-15 DIAGNOSIS — I5023 Acute on chronic systolic (congestive) heart failure: Secondary | ICD-10-CM

## 2019-09-15 LAB — BASIC METABOLIC PANEL
Anion gap: 7 (ref 5–15)
BUN: 16 mg/dL (ref 6–20)
CO2: 30 mmol/L (ref 22–32)
Calcium: 8.6 mg/dL — ABNORMAL LOW (ref 8.9–10.3)
Chloride: 93 mmol/L — ABNORMAL LOW (ref 98–111)
Creatinine, Ser: 1.09 mg/dL (ref 0.61–1.24)
GFR calc Af Amer: >60 mL/min (ref >60)
GFR calc non Af Amer: >60 mL/min (ref >60)
Glucose, Bld: 108 mg/dL — ABNORMAL HIGH (ref 70–99)
Potassium: 4.1 mmol/L (ref 3.5–5.1)
Sodium: 130 mmol/L — ABNORMAL LOW (ref 135–145)

## 2019-09-15 LAB — BRAIN NATRIURETIC PEPTIDE: B Natriuretic Peptide: 1444 pg/mL — ABNORMAL HIGH (ref 0.0–100.0)

## 2019-09-15 MED ORDER — FUROSEMIDE 10 MG/ML IJ SOLN: 80.0000 mg | Freq: Once | INTRAMUSCULAR | Status: DC

## 2019-09-15 MED ORDER — POTASSIUM CHLORIDE CRYS ER 20 MEQ PO TBCR
EXTENDED_RELEASE_TABLET | ORAL | Status: AC
  Filled 2019-09-15: qty 2

## 2019-09-15 MED ORDER — FUROSEMIDE 10 MG/ML IJ SOLN
INTRAMUSCULAR | Status: AC
  Filled 2019-09-15: qty 8

## 2019-09-15 MED ORDER — SODIUM CHLORIDE FLUSH 0.9 % IV SOLN
INTRAVENOUS | Status: AC
  Filled 2019-09-15: qty 20

## 2019-09-15 MED ORDER — SODIUM CHLORIDE FLUSH 0.9 % IV SOLN
INTRAVENOUS | Status: AC
  Filled 2019-09-15: qty 10

## 2019-09-15 MED ORDER — POTASSIUM CHLORIDE CRYS ER 20 MEQ PO TBCR
40.0000 meq | EXTENDED_RELEASE_TABLET | Freq: Once | ORAL | Status: AC
  Administered 2019-09-15: 40 meq via ORAL

## 2019-09-15 NOTE — Patient Instructions (Addendum)
Resume weighing daily and call for an overnight weight gain of > 2 pounds or a weekly weight gain of >5 pounds. 

## 2019-09-16 ENCOUNTER — Encounter (INDEPENDENT_AMBULATORY_CARE_PROVIDER_SITE_OTHER): Payer: Medicare Other

## 2019-09-17 ENCOUNTER — Ambulatory Visit
Admission: RE | Admit: 2019-09-17 | Discharge: 2019-09-17 | Disposition: A | Discharge status: Home / Self Care | Payer: Medicare Other | Source: Ambulatory Visit | Attending: Family | Admitting: Family

## 2019-09-17 ENCOUNTER — Other Ambulatory Visit: Payer: Self-pay

## 2019-09-17 ENCOUNTER — Encounter: Payer: Self-pay | Admitting: Family

## 2019-09-17 ENCOUNTER — Ambulatory Visit: Payer: Medicare Other

## 2019-09-17 ENCOUNTER — Ambulatory Visit: Payer: Medicare Other | Admitting: Family

## 2019-09-17 ENCOUNTER — Other Ambulatory Visit: Payer: Self-pay | Admitting: Family

## 2019-09-17 ENCOUNTER — Emergency Department
Admission: EM | Admit: 2019-09-17 | Discharge: 2019-09-17 | Disposition: A | Discharge status: Home / Self Care | Payer: Medicare Other | Attending: Emergency Medicine | Admitting: Emergency Medicine

## 2019-09-17 VITALS — BP 101/74 | HR 95 | Resp 16 | Ht 67.0 in | Wt 222.1 lb

## 2019-09-17 DIAGNOSIS — Z5321 Procedure and treatment not carried out due to patient leaving prior to being seen by health care provider: Secondary | ICD-10-CM | POA: Insufficient documentation

## 2019-09-17 DIAGNOSIS — I5023 Acute on chronic systolic (congestive) heart failure: Secondary | ICD-10-CM

## 2019-09-17 DIAGNOSIS — Z794 Long term (current) use of insulin: Secondary | ICD-10-CM

## 2019-09-17 DIAGNOSIS — E1159 Type 2 diabetes mellitus with other circulatory complications: Secondary | ICD-10-CM

## 2019-09-17 DIAGNOSIS — R2242 Localized swelling, mass and lump, left lower limb: Secondary | ICD-10-CM | POA: Diagnosis present

## 2019-09-17 DIAGNOSIS — Z720 Tobacco use: Secondary | ICD-10-CM

## 2019-09-17 DIAGNOSIS — I70219 Atherosclerosis of native arteries of extremities with intermittent claudication, unspecified extremity: Secondary | ICD-10-CM

## 2019-09-17 DIAGNOSIS — I10 Essential (primary) hypertension: Secondary | ICD-10-CM

## 2019-09-17 LAB — COMPREHENSIVE METABOLIC PANEL
ALT: 10 U/L (ref 0–44)
AST: 23 U/L (ref 15–41)
Albumin: 3.4 g/dL — ABNORMAL LOW (ref 3.5–5.0)
Alkaline Phosphatase: 93 U/L (ref 38–126)
Anion gap: 10 (ref 5–15)
BUN: 12 mg/dL (ref 6–20)
CO2: 31 mmol/L (ref 22–32)
Calcium: 8.6 mg/dL — ABNORMAL LOW (ref 8.9–10.3)
Chloride: 92 mmol/L — ABNORMAL LOW (ref 98–111)
Creatinine, Ser: 1.19 mg/dL (ref 0.61–1.24)
GFR calc Af Amer: >60 mL/min (ref >60)
GFR calc non Af Amer: >60 mL/min (ref >60)
Glucose, Bld: 105 mg/dL — ABNORMAL HIGH (ref 70–99)
Potassium: 3.8 mmol/L (ref 3.5–5.1)
Sodium: 133 mmol/L — ABNORMAL LOW (ref 135–145)
Total Bilirubin: 1.2 mg/dL (ref 0.3–1.2)
Total Protein: 6.5 g/dL (ref 6.5–8.1)

## 2019-09-17 LAB — BRAIN NATRIURETIC PEPTIDE: B Natriuretic Peptide: 1294 pg/mL — ABNORMAL HIGH (ref 0.0–100.0)

## 2019-09-17 LAB — CBC
HCT: 36.5 % — ABNORMAL LOW (ref 39.0–52.0)
Hemoglobin: 11.7 g/dL — ABNORMAL LOW (ref 13.0–17.0)
MCH: 23.3 pg — ABNORMAL LOW (ref 26.0–34.0)
MCHC: 32.1 g/dL (ref 30.0–36.0)
MCV: 72.6 fL — ABNORMAL LOW (ref 80.0–100.0)
Platelets: 160 10*3/uL (ref 150–400)
RBC: 5.03 MIL/uL (ref 4.22–5.81)
RDW: 25 % — ABNORMAL HIGH (ref 11.5–15.5)
WBC: 5.4 10*3/uL (ref 4.0–10.5)
nRBC: 0 % (ref 0.0–0.2)

## 2019-09-17 LAB — TROPONIN I (HIGH SENSITIVITY)
Troponin I (High Sensitivity): 33 ng/L — ABNORMAL HIGH (ref <18)
Troponin I (High Sensitivity): 38 ng/L — ABNORMAL HIGH (ref <18)

## 2019-09-17 LAB — GLUCOSE, CAPILLARY: Glucose-Capillary: 108 mg/dL — ABNORMAL HIGH (ref 70–99)

## 2019-09-17 LAB — LACTIC ACID, PLASMA: Lactic Acid, Venous: 1.5 mmol/L (ref 0.5–1.9)

## 2019-09-17 MED ORDER — POTASSIUM CHLORIDE CRYS ER 20 MEQ PO TBCR
40.0000 meq | EXTENDED_RELEASE_TABLET | Freq: Once | ORAL | Status: AC
  Administered 2019-09-17: 40 meq via ORAL

## 2019-09-17 MED ORDER — POTASSIUM CHLORIDE CRYS ER 20 MEQ PO TBCR
EXTENDED_RELEASE_TABLET | ORAL | Status: AC
  Filled 2019-09-17: qty 2

## 2019-09-17 MED ORDER — FUROSEMIDE 10 MG/ML IJ SOLN
80.0000 mg | Freq: Once | INTRAMUSCULAR | Status: AC
  Administered 2019-09-17: 80 mg via INTRAVENOUS

## 2019-09-17 MED ORDER — FUROSEMIDE 10 MG/ML IJ SOLN
INTRAMUSCULAR | Status: AC
  Filled 2019-09-17: qty 8

## 2019-09-17 NOTE — Progress Notes (Addendum)
Patient ID: [name] [name], male    DOB: 24-Apr-1961, 59 y.o.   MRN: [id number]  HPI  [name] is a 59 y/o male with a history of obstructive sleep apnea, PVD, PAD, HTN, hyperlipidemia, DM, CAD, COPD, BPH, asthma, current tobacco use and chronic heart failure.   Echo report from 11/06/2018 reviewed and showed an EF of 20-25% along with moderate TR and a PA pressure of 59.6 mmHg. Echo report from 04/25/17 reviewed and shows an EF of 25-30% along with a PA pressure of 34 mm Hg. EF has declined from 55-60% back in 2016.   Cardiac catheterization done 04/28/17 showed significant three-vessel disease with a patient stent in the RCA and LAD. Significant proximal RCS disease and the stent as well as diffuse mid and distal disease. LAD has moderate disease. Severely elevated left ventricular end-diastolic pressure at 34 mmHg. Possible CABG in the future with optimizing medical management. Stress done in 2015.  Was in the ED earlier today 09/17/19 but left without being seen. Was in the ED 09/10/19 due to pedal edema. Admits to not taking his medications. Given IV lasix with improvement in symptoms and he was released. Was in the ED 3/22/210 due to HF exacerbation. Nebulizer given for wheezing. CT and [place] were negative for clots. Mildly elevated troponin thought to be due to demand ischemia. IV antibiotics given for LLL cellulitis. Patient left AMA  He presents today for a follow-up visit with a chief complaint of continued shortness of breath with little exertion. He describes this as chronic in nature having been present for several years. Was a little better after recent IV lasix but then returned. Has been in the ED waiting to be seen since around midnight and finally left. He has associated fatigue, wheezing, pedal edema, abdominal distention and headaches along with this. He denies any difficulty sleeping, dizziness, palpitations, chest pain or cough.   Went to the ED earlier today but left without being seen  due to extended weight time and came early to his appointment today.   Received 80mg  IV lasix/ 46meq PO potassium 2 days ago. Says that it helped for about a day and then symptoms started returning.   Past Medical History:  Diagnosis Date  . Arthritis   . Asthma   . Atherosclerosis   . BPH (benign prostatic hyperplasia)   . Carotid arterial disease ([place])   . Charcot's joint of foot, right   . Chronic combined systolic (congestive) and diastolic (congestive) heart failure ([place])    a. 02/2013 EF 50% by LV gram; b. 04/2017 Echo: EF 25-30%. diff HK. Gr2 DD. Mod dil LA/RV. PASP 19mmHg.  [name] COPD (chronic obstructive pulmonary disease) ([place])   . Coronary artery disease    a. 2013 S/P PCI of LAD [facility]);  b. 03/2013 PCI: RCA 90p ( 2.5 x 23 mm DES); c. 02/2014 Cath: patent RCA stent->Med Rx; d. 04/2017 Cath: LM nl, LAd 30ost/p, 6m/d, D1 80ost, LCX 90p/m, OM1 85, RCA 70/20p, 94m, 70d->Referred for CT Surg-felt to be poor candidate.  . Diabetic neuropathy ([place])   . Diabetic ulcer of right foot ([place])   . Hernia   . Hyperlipidemia   . Hypertension   . Ischemic cardiomyopathy    a. 04/2017 Echo: EF 25-30%; b. 08/2017 Cardiac MRI ([facility]): EF 19%, sev glob HK. RVEF 20%, mod BAE, triv MR, mild-mod TR. Basal lateral subendocardial infarct (viable), inf/infsept, dist septal ischemia, basal to mid lat peri-infarct ischemia.  . Morbid obesity ([place])   .  Neuropathy   . PAD (peripheral artery disease) ([place])    a. Followed by Dr. Lucky Cowboy; b. 08/2010 Periph Angio: RSFA 70-80p (6X60 self-expanding stent); c. 09/2010 Periph Angio: L SFA 100p (7x unknown length self-expanding stent); d. 06/2015 Periph Angio: R SFA short segment occlusion (6x12 self-expanding stent).  . Restless leg syndrome   . Sleep apnea   . Subclavian artery stenosis, right ([place])   . Syncope and collapse   . Tobacco use    a. 75+ yr hx - still smoking 1ppd, down from 2 ppd.  . Type II diabetes mellitus ([place])   . Varicose vein    Past Surgical History:   Procedure Laterality Date  . ABDOMINAL AORTAGRAM N/A 06/23/2013   Procedure: ABDOMINAL Maxcine Ham;  Surgeon: Wellington Hampshire, MD;  Location: [place] CATH LAB;  Service: Cardiovascular;  Laterality: N/A;  . AMPUTATION Right 04/01/2018   Procedure: AMPUTATION BELOW KNEE;  Surgeon: Algernon Huxley, MD;  Location: [facility] ORS;  Service: Vascular;  Laterality: Right;  . APPENDECTOMY    . CARDIAC CATHETERIZATION  10/14   [facility]; X1 STENT PROXIMAL RCA  . CARDIAC CATHETERIZATION  02/2010   [facility]  . CARDIAC CATHETERIZATION  02/21/2014   [facility]  . CLAVICLE SURGERY    . HERNIA REPAIR    . IRRIGATION AND DEBRIDEMENT FOOT Right 01/04/2018   Procedure: IRRIGATION AND DEBRIDEMENT FOOT;  Surgeon: Samara Deist, DPM;  Location: [facility] ORS;  Service: Podiatry;  Laterality: Right;  . LEFT HEART CATH AND CORS/GRAFTS ANGIOGRAPHY N/A 04/28/2017   Procedure: LEFT HEART CATH AND CORONARY ANGIOGRAPHY;  Surgeon: Wellington Hampshire, MD;  Location: [place] CV LAB;  Service: Cardiovascular;  Laterality: N/A;  . LOWER EXTREMITY ANGIOGRAPHY Right 03/03/2018   Procedure: LOWER EXTREMITY ANGIOGRAPHY;  Surgeon: Katha Cabal, MD;  Location: [place] CV LAB;  Service: Cardiovascular;  Laterality: Right;  . LOWER EXTREMITY ANGIOGRAPHY Left 08/24/2018   Procedure: LOWER EXTREMITY ANGIOGRAPHY;  Surgeon: Algernon Huxley, MD;  Location: [place] CV LAB;  Service: Cardiovascular;  Laterality: Left;  . LOWER EXTREMITY ANGIOGRAPHY Left 06/14/2019   Procedure: LOWER EXTREMITY ANGIOGRAPHY;  Surgeon: Algernon Huxley, MD;  Location: [place] CV LAB;  Service: Cardiovascular;  Laterality: Left;  . LOWER EXTREMITY ANGIOGRAPHY Left 08/02/2019   Procedure: LOWER EXTREMITY ANGIOGRAPHY;  Surgeon: Algernon Huxley, MD;  Location: [place] CV LAB;  Service: Cardiovascular;  Laterality: Left;  . PERIPHERAL ARTERIAL STENT GRAFT     x2 left/right  . PERIPHERAL VASCULAR BALLOON ANGIOPLASTY Right 01/06/2018   Procedure: PERIPHERAL VASCULAR BALLOON  ANGIOPLASTY;  Surgeon: Katha Cabal, MD;  Location: [place] CV LAB;  Service: Cardiovascular;  Laterality: Right;  . PERIPHERAL VASCULAR CATHETERIZATION N/A 07/06/2015   Procedure: Abdominal Aortogram w/Lower Extremity;  Surgeon: Algernon Huxley, MD;  Location: [place] CV LAB;  Service: Cardiovascular;  Laterality: N/A;  . PERIPHERAL VASCULAR CATHETERIZATION  07/06/2015   Procedure: Lower Extremity Intervention;  Surgeon: Algernon Huxley, MD;  Location: [place] CV LAB;  Service: Cardiovascular;;   Family History  Problem Relation Age of Onset  . Heart attack Father   . Heart disease Father   . Hypertension Father   . Hyperlipidemia Father   . Hypertension Mother   . Hyperlipidemia Mother    Social History   Tobacco Use  . Smoking status: Current Every Day Smoker    Packs/day: 1.00    Years: 41.00    Pack years: 41.00    Types: Cigarettes  . Smokeless tobacco:  Never Used  Substance Use Topics  . Alcohol use: No   Allergies  Allergen Reactions  . Dulaglutide Anaphylaxis, Diarrhea and Hives  . Other Itching    Skin itching associated with nitro patch  . Prednisone Rash   Prior to Admission medications   Medication Sig Start Date End Date Taking? Authorizing Provider  atorvastatin (LIPITOR) 80 MG tablet Take 80 mg by mouth daily.   Yes [provider]  albuterol (VENTOLIN HFA) 108 (90 Base) MCG/ACT inhaler Inhale into the lungs every 6 (six) hours as needed for wheezing or shortness of breath.    [provider]  aspirin EC 81 MG tablet Take 1 tablet (81 mg total) by mouth daily. 05/11/15   Wellington Hampshire, MD  calcium carbonate (TUMS - DOSED IN MG ELEMENTAL CALCIUM) 500 MG chewable tablet Chew 4 tablets by mouth daily as needed for indigestion or heartburn.    [provider]  cephALEXin (KEFLEX) 500 MG capsule Take 1 capsule (500 mg total) by mouth 4 (four) times daily. 08/31/19   Minna Merritts, MD  clindamycin (CLEOCIN) 300 MG  capsule Take by mouth. 05/24/19   [provider]  clopidogrel (PLAVIX) 75 MG tablet Take 75 mg by mouth daily.  08/22/17   [provider]  collagenase (SANTYL) ointment Apply 1 application topically daily. 08/11/19   Kris Hartmann, NP  doxycycline (VIBRAMYCIN) 100 MG capsule Take 1 capsule (100 mg total) by mouth 2 (two) times daily. 09/08/19   Kris Hartmann, NP  ENTRESTO 24-26 MG TAKE 1 TABLET BY MOUTH TWICE DAILY 06/19/19   Alisa Graff, FNP  ezetimibe (ZETIA) 10 MG tablet Take 1 tablet (10 mg total) by mouth daily. 05/19/19   Alisa Graff, FNP  gabapentin (NEURONTIN) 800 MG tablet Take 800 mg by mouth 4 (four) times daily.  08/14/17 09/06/19  [provider]  Insulin Glargine (LANTUS SOLOSTAR) 100 UNIT/ML Solostar Pen Inject 30 Units into the skin at bedtime. Patient taking differently: Inject 20-40 Units into the skin 2 (two) times daily. 40 units in the morning and 20 units at night 04/07/18   Mayo, Pete Pelt, MD  insulin lispro (HUMALOG) 100 UNIT/ML injection Sliding Scale Insulin before meals using Novolog If BS is 0 to 70 no insulin and eat/glucose tablets/glucose get If BS 71 to 150 great! If BS 151 to 200, 2 Units Novolog If BS 201 to 250, 4 Units Novolog If BS 251 to 300, 6 Units Novolog If BS 301 to 350, 8 Units Novolog If BS 351 to 400, 10 Units Novolog If BS greater than 400, take 10 Units and Contact our office during office hour 04/14/18   [provider]  ipratropium-albuterol (DUONEB) 0.5-2.5 (3) MG/3ML SOLN Inhale 3 mLs into the lungs every 6 (six) hours as needed (wheezing/sob).     [provider]  isosorbide mononitrate (IMDUR) 30 MG 24 hr tablet Take 1 tablet (30 mg total) by mouth daily. 05/19/19   Alisa Graff, FNP  metFORMIN (GLUCOPHAGE-XR) 500 MG 24 hr tablet Take 1,000 mg by mouth 2 (two) times daily.     [provider]  metoprolol succinate (TOPROL-XL) 25 MG 24 hr tablet Take 1 tablet (25 mg total) by mouth  daily. Patient not taking: Reported on 09/15/2019 07/02/19 08/29/20  Loel Dubonnet, NP  Multiple Vitamin (MULTI-VITAMINS) TABS Take 1 tablet by mouth daily.     [provider]  nitroGLYCERIN (NITRODUR - DOSED IN MG/24 HR) 0.4 mg/hr  patch Place 0.4 mg onto the skin daily.    [provider]  nitroGLYCERIN (NITROSTAT) 0.4 MG SL tablet Place 1 tablet (0.4 mg total) under the tongue every 5 (five) minutes as needed for chest pain. Patient not taking: Reported on 09/06/2019 05/19/19   Darylene Price A, FNP  Oxycodone HCl 10 MG TABS Take 10 mg by mouth every 6 (six) hours as needed (pain).  01/15/18   [provider]  potassium chloride (KLOR-CON) 10 MEQ tablet Take 1 tablet (10 mEq total) by mouth daily. Patient not taking: Reported on 09/06/2019 05/19/19   Alisa Graff, FNP  promethazine (PHENERGAN) 25 MG tablet TAKE 1 TABLET BY MOUTH EVERY 6 HOURS AS NEEDED NAUSEA 04/29/18   [provider]  spironolactone (ALDACTONE) 25 MG tablet Take 25 mg by mouth daily.  03/02/18   [provider]  sulfamethoxazole-trimethoprim (BACTRIM DS) 800-160 MG tablet Take 1 tablet by mouth 2 (two) times daily. Patient not taking: Reported on 09/08/2019 07/22/19   Kris Hartmann, NP  torsemide (DEMADEX) 20 MG tablet Take 2 tablets (40 mg total) by mouth 2 (two) times daily. 40mg  AM/ 20mg  PM Patient taking differently: Take 40 mg by mouth 2 (two) times daily.  08/31/19   Minna Merritts, MD  traZODone (DESYREL) 150 MG tablet Take 150 mg by mouth at bedtime. 07/12/19   [provider]     Review of Systems  Constitutional: Positive for fatigue (minimal). Negative for appetite change.  HENT: Positive for rhinorrhea. Negative for congestion, postnasal drip and sore throat.   Eyes: Negative.   Respiratory: Positive for shortness of breath (upon exertion) and wheezing. Negative for cough and chest tightness.   Cardiovascular: Positive for leg swelling (left leg). Negative for  chest pain and palpitations.  Gastrointestinal: Positive for abdominal distention. Negative for abdominal pain.  Endocrine: Negative.   Genitourinary: Negative.   Musculoskeletal: Positive for back pain. Negative for neck pain.       Has right lower leg prosthesis  Skin: Positive for wound (left foot).  Allergic/Immunologic: Negative.   Neurological: Positive for headaches. Negative for dizziness and light-headedness.  Hematological: Negative for adenopathy. Bruises/bleeds easily.  Psychiatric/Behavioral: Negative for dysphoric mood and sleep disturbance (sleeping on 2 pillows). The patient is not nervous/anxious.    Vitals:   09/17/19 1019  BP: 101/74  Pulse: 95  Resp: 16  SpO2: 99%  Weight: 222 lb 2 oz (100.8 kg)   Wt Readings from Last 3 Encounters:  09/17/19 222 lb 2 oz (100.8 kg)  09/15/19 223 lb 6 oz (101.3 kg)  09/10/19 217 lb (98.4 kg)   Lab Results  Component Value Date   CREATININE 1.19 09/17/2019   CREATININE 1.09 09/15/2019   CREATININE 1.44 (H) 09/10/2019     Physical Exam  Constitutional: He is oriented to person, place, and time. He appears well-developed and well-nourished.  HENT:  Head: Normocephalic and atraumatic.  Neck: No JVD present.  Cardiovascular: Normal rate and regular rhythm.  Pulmonary/Chest: Effort normal. He has no wheezes. He has rhonchi in the right upper field, the right lower field, the left upper field and the left lower field. He has no rales.  Abdominal: Soft. He exhibits distension. There is no abdominal tenderness.  Musculoskeletal:        General: Edema (2+ pitting edema in left lower leg ) present. No tenderness.     Cervical back: Normal range of motion and neck supple.  Neurological: He is alert and oriented to  person, place, and time.  Skin: Skin is warm and dry. There is erythema (left anterior shin/ shiny in appearance).  Psychiatric: He has a normal mood and affect. His behavior is normal. Thought content normal.  Nursing  note and vitals reviewed.   Assessment & Plan:  1: Acute on Chronic heart failure with reduced ejection fraction- - NYHA class III - moderately fluid overloaded today with edema and abdominal distention - not weighing daily; encouraged to resume so that he could call for an overnight weight gain of >2 pounds or a weekly weight gain of >5 pounds - weight down 1.4 pounds from last visit here 2 days ago - will send for 80mg  IV lasix/ 38meq PO potassium today; already had labs done while in ED so will not repeat those - not adding salt and has been using Mrs. Dash & trying to read food labels.  - drinking ~ 64 ounces of fluid daily (Mtn Dew, pepsi, water) - saw cardiologist Rockey Situ) 08/31/19  - BNP 09/17/19 was 1294.0 - says that he does not take the flu vaccine; good handwashing encouraged - will make referral to paramedicine program; patient is agreeable  2: HTN- - BP looks good although on the low side - saw PCP Dr. 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 Each medication was verbally reviewed with the patient and he was encouraged to bring the bottles to every visit to confirm accuracy of list.  Return in 3 days (Monday). Should symptoms worsen over the weekend, he is to present to the ED. Patient verbalized understanding.

## 2019-09-17 NOTE — ED Triage Notes (Signed)
Pt in with co left leg swelling states for few weeks. Was here for the same and discharged with lasix. Pt states symptoms are worsening, and left leg is weeping.

## 2019-09-17 NOTE — Patient Instructions (Signed)
Continue weighing daily and call for an overnight weight gain of > 2 pounds or a weekly weight gain of >5 pounds. 

## 2019-09-17 NOTE — Progress Notes (Signed)
Patient ID: RENALDO [name], male    DOB: 26-Jul-1960, 59 y.o.   MRN: [id number]  HPI  [name] [name] is a 59 y/o male with a history of obstructive sleep apnea, PVD, PAD, HTN, hyperlipidemia, DM, CAD, COPD, BPH, asthma, current tobacco use and chronic heart failure.   Echo report from 11/06/2018 reviewed and showed an EF of 20-25% along with moderate TR and a PA pressure of 59.6 mmHg. Echo report from 04/25/17 reviewed and shows an EF of 25-30% along with a PA pressure of 34 mm Hg. EF has declined from 55-60% back in 2016.   Cardiac catheterization done 04/28/17 showed significant three-vessel disease with a patient stent in the RCA and LAD. Significant proximal RCS disease and the stent as well as diffuse mid and distal disease. LAD has moderate disease. Severely elevated left ventricular end-diastolic pressure at 34 mmHg. Possible CABG in the future with optimizing medical management. Stress done in 2015.  Was in the ED 09/17/19 but left without being seen. Was in the ED 09/10/19 due to pedal edema. Admits to not taking his medications. Given IV lasix with improvement in symptoms and [name] [name] was released. Was in the ED 3/22/210 due to HF exacerbation. Nebulizer given for wheezing. CT and [place] were negative for clots. Mildly elevated troponin thought to be due to demand ischemia. IV antibiotics given for LLL cellulitis. Patient left AMA  [name] [name] presents today for a follow-up visit with a chief complaint of continued moderate shortness of breath upon minimal exertion. [name] [name] describes this as chronic in nature having been present for several years. [name] [name] has associated fatigue, wheezing, pedal edema, abdominal distention and weight gain along with this. [name] [name] denies any dizziness, palpitations, chest pain or cough.   Received 80mg  IV lasix/ 7meq PO potassium twice last week & says that [name] [name] feels better for ~ 24 hours and then symptoms return.   Past Medical History:  Diagnosis Date  . Arthritis   . Asthma   . Atherosclerosis    . BPH (benign prostatic hyperplasia)   . Carotid arterial disease ([place])   . Charcot's joint of foot, right   . Chronic combined systolic (congestive) and diastolic (congestive) heart failure ([place])    a. 02/2013 EF 50% by LV gram; b. 04/2017 Echo: EF 25-30%. diff HK. Gr2 DD. Mod dil LA/RV. PASP 39mmHg.  [name] COPD (chronic obstructive pulmonary disease) ([place])   . Coronary artery disease    a. 2013 S/P PCI of LAD [facility]);  b. 03/2013 PCI: RCA 90p ( 2.5 x 23 mm DES); c. 02/2014 Cath: patent RCA stent->Med Rx; d. 04/2017 Cath: LM nl, LAd 30ost/p, 24m/d, D1 80ost, LCX 90p/m, OM1 85, RCA 70/20p, 15m, 70d->Referred for CT Surg-felt to be poor candidate.  . Diabetic neuropathy ([place])   . Diabetic ulcer of right foot ([place])   . Hernia   . Hyperlipidemia   . Hypertension   . Ischemic cardiomyopathy    a. 04/2017 Echo: EF 25-30%; b. 08/2017 Cardiac MRI ([facility]): EF 19%, sev glob HK. RVEF 20%, mod BAE, triv [name], mild-mod TR. Basal lateral subendocardial infarct (viable), inf/infsept, dist septal ischemia, basal to mid lat peri-infarct ischemia.  . Morbid obesity ([place])   . Neuropathy   . PAD (peripheral artery disease) ([place])    a. Followed by Dr. Lucky Cowboy; b. 08/2010 Periph Angio: RSFA 70-80p (6X60 self-expanding stent); c. 09/2010 Periph Angio: L SFA 100p (7x unknown length self-expanding stent); d. 06/2015 Periph Angio: R SFA short segment occlusion (6x12 self-expanding stent).  [name]  Restless leg syndrome   . Sleep apnea   . Subclavian artery stenosis, right ([place])   . Syncope and collapse   . Tobacco use    a. 75+ yr hx - still smoking 1ppd, down from 2 ppd.  . Type II diabetes mellitus ([place])   . Varicose vein    Past Surgical History:  Procedure Laterality Date  . ABDOMINAL AORTAGRAM N/A 06/23/2013   Procedure: ABDOMINAL Maxcine Ham;  Surgeon: Wellington Hampshire, MD;  Location: [place] CATH LAB;  Service: Cardiovascular;  Laterality: N/A;  . AMPUTATION Right 04/01/2018   Procedure: AMPUTATION BELOW KNEE;  Surgeon: Algernon Huxley, MD;  Location: [facility] ORS;  Service: Vascular;  Laterality: Right;  . APPENDECTOMY    . CARDIAC CATHETERIZATION  10/14   [facility]; X1 STENT PROXIMAL RCA  . CARDIAC CATHETERIZATION  02/2010   [facility]  . CARDIAC CATHETERIZATION  02/21/2014   [facility]  . CLAVICLE SURGERY    . HERNIA REPAIR    . IRRIGATION AND DEBRIDEMENT FOOT Right 01/04/2018   Procedure: IRRIGATION AND DEBRIDEMENT FOOT;  Surgeon: Samara Deist, DPM;  Location: [facility] ORS;  Service: Podiatry;  Laterality: Right;  . LEFT HEART CATH AND CORS/GRAFTS ANGIOGRAPHY N/A 04/28/2017   Procedure: LEFT HEART CATH AND CORONARY ANGIOGRAPHY;  Surgeon: Wellington Hampshire, MD;  Location: [place] CV LAB;  Service: Cardiovascular;  Laterality: N/A;  . LOWER EXTREMITY ANGIOGRAPHY Right 03/03/2018   Procedure: LOWER EXTREMITY ANGIOGRAPHY;  Surgeon: Katha Cabal, MD;  Location: [place] CV LAB;  Service: Cardiovascular;  Laterality: Right;  . LOWER EXTREMITY ANGIOGRAPHY Left 08/24/2018   Procedure: LOWER EXTREMITY ANGIOGRAPHY;  Surgeon: Algernon Huxley, MD;  Location: [name] CV LAB;  Service: Cardiovascular;  Laterality: Left;  . LOWER EXTREMITY ANGIOGRAPHY Left 06/14/2019   Procedure: LOWER EXTREMITY ANGIOGRAPHY;  Surgeon: Algernon Huxley, MD;  Location: [place] CV LAB;  Service: Cardiovascular;  Laterality: Left;  . LOWER EXTREMITY ANGIOGRAPHY Left 08/02/2019   Procedure: LOWER EXTREMITY ANGIOGRAPHY;  Surgeon: Algernon Huxley, MD;  Location: [place] CV LAB;  Service: Cardiovascular;  Laterality: Left;  . PERIPHERAL ARTERIAL STENT GRAFT     x2 left/right  . PERIPHERAL VASCULAR BALLOON ANGIOPLASTY Right 01/06/2018   Procedure: PERIPHERAL VASCULAR BALLOON ANGIOPLASTY;  Surgeon: Katha Cabal, MD;  Location: [place] CV LAB;  Service: Cardiovascular;  Laterality: Right;  . PERIPHERAL VASCULAR CATHETERIZATION N/A 07/06/2015   Procedure: Abdominal Aortogram w/Lower Extremity;  Surgeon: Algernon Huxley, MD;  Location: [place] CV LAB;   Service: Cardiovascular;  Laterality: N/A;  . PERIPHERAL VASCULAR CATHETERIZATION  07/06/2015   Procedure: Lower Extremity Intervention;  Surgeon: Algernon Huxley, MD;  Location: [place] CV LAB;  Service: Cardiovascular;;   Family History  Problem Relation Age of Onset  . Heart attack Father   . Heart disease Father   . Hypertension Father   . Hyperlipidemia Father   . Hypertension Mother   . Hyperlipidemia Mother    Social History   Tobacco Use  . Smoking status: Current Every Day Smoker    Packs/day: 1.00    Years: 41.00    Pack years: 41.00    Types: Cigarettes  . Smokeless tobacco: Never Used  Substance Use Topics  . Alcohol use: No   Allergies  Allergen Reactions  . Dulaglutide Anaphylaxis, Diarrhea and Hives  . Other Itching    Skin itching associated with nitro patch  . Prednisone Rash   Prior to Admission medications   Medication Sig Start  Date End Date Taking? Authorizing Provider  albuterol (VENTOLIN HFA) 108 (90 Base) MCG/ACT inhaler Inhale into the lungs every 6 (six) hours as needed for wheezing or shortness of breath.   Yes [provider]  aspirin EC 81 MG tablet Take 1 tablet (81 mg total) by mouth daily. 05/11/15  Yes Wellington Hampshire, MD  atorvastatin (LIPITOR) 80 MG tablet Take 80 mg by mouth daily.   Yes [provider]  calcium carbonate (TUMS - DOSED IN MG ELEMENTAL CALCIUM) 500 MG chewable tablet Chew 4 tablets by mouth daily as needed for indigestion or heartburn.   Yes [provider]  cephALEXin (KEFLEX) 500 MG capsule Take 1 capsule (500 mg total) by mouth 4 (four) times daily. 08/31/19  Yes Minna Merritts, MD  clindamycin (CLEOCIN) 300 MG capsule Take by mouth. 05/24/19  Yes [provider]  clopidogrel (PLAVIX) 75 MG tablet Take 75 mg by mouth daily.  08/22/17  Yes [provider]  collagenase (SANTYL) ointment Apply 1 application topically daily. 08/11/19  Yes Kris Hartmann, NP  doxycycline  (VIBRAMYCIN) 100 MG capsule Take 1 capsule (100 mg total) by mouth 2 (two) times daily. 09/08/19  Yes Kris Hartmann, NP  ENTRESTO 24-26 MG TAKE 1 TABLET BY MOUTH TWICE DAILY 06/19/19  Yes Darylene Price A, FNP  ezetimibe (ZETIA) 10 MG tablet Take 1 tablet (10 mg total) by mouth daily. 05/19/19  Yes Flynn Lininger, Otila Kluver A, FNP  Insulin Glargine (LANTUS SOLOSTAR) 100 UNIT/ML Solostar Pen Inject 30 Units into the skin at bedtime. Patient taking differently: Inject 20-40 Units into the skin 2 (two) times daily. 40 units in the morning and 20 units at night 04/07/18  Yes Mayo, Pete Pelt, MD  insulin lispro (HUMALOG) 100 UNIT/ML injection Sliding Scale Insulin before meals using Novolog If BS is 0 to 70 no insulin and eat/glucose tablets/glucose get If BS 71 to 150 great! If BS 151 to 200, 2 Units Novolog If BS 201 to 250, 4 Units Novolog If BS 251 to 300, 6 Units Novolog If BS 301 to 350, 8 Units Novolog If BS 351 to 400, 10 Units Novolog If BS greater than 400, take 10 Units and Contact our office during office hour 04/14/18  Yes [provider]  ipratropium-albuterol (DUONEB) 0.5-2.5 (3) MG/3ML SOLN Inhale 3 mLs into the lungs every 6 (six) hours as needed (wheezing/sob).    Yes [provider]  isosorbide mononitrate (IMDUR) 30 MG 24 hr tablet Take 1 tablet (30 mg total) by mouth daily. 05/19/19  Yes Margrett Kalb, Otila Kluver A, FNP  metFORMIN (GLUCOPHAGE-XR) 500 MG 24 hr tablet Take 1,000 mg by mouth 2 (two) times daily.    Yes [provider]  metoprolol succinate (TOPROL-XL) 25 MG 24 hr tablet Take 1 tablet (25 mg total) by mouth daily. 07/02/19 08/29/20 Yes Loel Dubonnet, NP  Multiple Vitamin (MULTI-VITAMINS) TABS Take 1 tablet by mouth daily.    Yes [provider]  nitroGLYCERIN (NITRODUR - DOSED IN MG/24 HR) 0.4 mg/hr patch Place 0.4 mg onto the skin daily.   Yes [provider]  promethazine (PHENERGAN) 25 MG tablet TAKE 1 TABLET BY MOUTH EVERY 6 HOURS AS NEEDED NAUSEA  04/29/18  Yes [provider]  spironolactone (ALDACTONE) 25 MG tablet Take 25 mg by mouth daily.  03/02/18  Yes [provider]  torsemide (DEMADEX) 20 MG tablet Take 2 tablets (40 mg total) by mouth 2 (two) times daily. 40mg  AM/ 20mg  PM Patient taking  differently: Take 40 mg by mouth 2 (two) times daily.  08/31/19  Yes Minna Merritts, MD  traZODone (DESYREL) 150 MG tablet Take 150 mg by mouth at bedtime. 07/12/19  Yes [provider]  gabapentin (NEURONTIN) 800 MG tablet Take 800 mg by mouth 4 (four) times daily.  08/14/17 09/06/19  [provider]  nitroGLYCERIN (NITROSTAT) 0.4 MG SL tablet Place 1 tablet (0.4 mg total) under the tongue every 5 (five) minutes as needed for chest pain. Patient not taking: Reported on 09/20/2019 05/19/19   Darylene Price A, FNP  Oxycodone HCl 10 MG TABS Take 10 mg by mouth every 6 (six) hours as needed (pain).  01/15/18   [provider]  potassium chloride (KLOR-CON) 10 MEQ tablet Take 1 tablet (10 mEq total) by mouth daily. Patient not taking: Reported on 09/06/2019 05/19/19   Alisa Graff, FNP  sulfamethoxazole-trimethoprim (BACTRIM DS) 800-160 MG tablet Take 1 tablet by mouth 2 (two) times daily. Patient not taking: Reported on 09/08/2019 07/22/19   Kris Hartmann, NP     Review of Systems  Constitutional: Positive for fatigue (minimal). Negative for appetite change.  HENT: Positive for rhinorrhea. Negative for congestion, postnasal drip and sore throat.   Eyes: Negative.   Respiratory: Positive for shortness of breath (upon exertion) and wheezing. Negative for cough and chest tightness.   Cardiovascular: Positive for leg swelling (left leg). Negative for chest pain and palpitations.  Gastrointestinal: Positive for abdominal distention. Negative for abdominal pain.  Endocrine: Negative.   Genitourinary: Negative.   Musculoskeletal: Positive for back pain. Negative for neck pain.       Has right lower leg prosthesis   Skin: Positive for wound (left foot).  Allergic/Immunologic: Negative.   Neurological: Positive for headaches. Negative for dizziness and light-headedness.  Hematological: Negative for adenopathy. Bruises/bleeds easily.  Psychiatric/Behavioral: Negative for dysphoric mood and sleep disturbance (sleeping on 2 pillows). The patient is not nervous/anxious.     Vitals:   09/20/19 1116  BP: 106/77  Pulse: (!) 108  Resp: 15  SpO2: 95%  Weight: 224 lb 2 oz (101.7 kg)  Height: 5\' 7"  (1.702 m)   Wt Readings from Last 3 Encounters:  09/20/19 224 lb 2 oz (101.7 kg)  09/17/19 222 lb 2 oz (100.8 kg)  09/15/19 223 lb 6 oz (101.3 kg)   Lab Results  Component Value Date   CREATININE 1.19 09/17/2019   CREATININE 1.09 09/15/2019   CREATININE 1.44 (H) 09/10/2019    Physical Exam  Constitutional: [name] [name] is oriented to person, place, and time. [name] [name] appears well-developed and well-nourished.  HENT:  Head: Normocephalic and atraumatic.  Neck: No JVD present.  Cardiovascular: Normal rate and regular rhythm.  Pulmonary/Chest: Effort normal. [name] [name] has no wheezes. [name] [name] has rhonchi in the right upper field, the right lower field, the left upper field and the left lower field. [name] [name] has no rales.  Abdominal: Soft. [name] [name] exhibits distension. There is no abdominal tenderness.  Musculoskeletal:        General: Edema (2+ pitting edema in left lower leg ) present. No tenderness.     Cervical back: Normal range of motion and neck supple.  Neurological: [name] [name] is alert and oriented to person, place, and time.  Skin: Skin is warm and dry. There is erythema (left anterior shin/ shiny in appearance).  Psychiatric: [name] [name] has a normal mood and affect. His behavior is normal. Thought content normal.  Nursing note and vitals reviewed.   Assessment & Plan:  1:  Acute on Chronic heart failure with reduced ejection fraction- - NYHA class III - continued to be moderately fluid overloaded today with edema and abdominal distention - not  weighing daily; encouraged to resume so that [name] [name] could call for an overnight weight gain of >2 pounds or a weekly weight gain of >5 pounds - weight up 2 pounds from last visit here 3 days ago - not adding salt and has been using Mrs. Dash & trying to read food labels.  - drinking ~ 64 ounces of fluid daily (Mtn Dew, pepsi, water) - saw cardiologist Rockey Situ) 08/31/19; f/u appt made for 09/22/19 - will send back for 80mg  IV lasix/ 57meq PO Potassium again today - BNP/BMP to be drawn today - will schedule for abd U/S & possible paracentesis tomorrow - BNP 09/17/19 was 1294.0 - says that [name] [name] does not take the flu vaccine; good handwashing encouraged - referral has been made to paramedicine program - should symptoms worsen, [name] [name] is to present to the ED  2: HTN- - BP looks good today - saw PCP Dr. 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 Each medication was verbally reviewed with the patient and [name] [name] was encouraged to bring the bottles to every visit to confirm accuracy of list. Patient does not know what [name] [name]'s taking by recall.    Return here pending ultrasound and cardiology appointment.

## 2019-09-20 ENCOUNTER — Ambulatory Visit: Payer: Medicare Other | Admitting: Family

## 2019-09-20 ENCOUNTER — Other Ambulatory Visit: Payer: Self-pay | Admitting: Family

## 2019-09-20 ENCOUNTER — Encounter: Payer: Self-pay | Admitting: Family

## 2019-09-20 ENCOUNTER — Other Ambulatory Visit: Payer: Self-pay

## 2019-09-20 ENCOUNTER — Ambulatory Visit
Admission: RE | Admit: 2019-09-20 | Discharge: 2019-09-20 | Disposition: A | Discharge status: Home / Self Care | Payer: Medicare Other | Source: Ambulatory Visit | Attending: Family | Admitting: Family

## 2019-09-20 VITALS — BP 106/77 | HR 108 | Resp 15 | Ht 67.0 in | Wt 224.1 lb

## 2019-09-20 DIAGNOSIS — I5023 Acute on chronic systolic (congestive) heart failure: Secondary | ICD-10-CM

## 2019-09-20 DIAGNOSIS — Z794 Long term (current) use of insulin: Secondary | ICD-10-CM

## 2019-09-20 DIAGNOSIS — I70219 Atherosclerosis of native arteries of extremities with intermittent claudication, unspecified extremity: Secondary | ICD-10-CM

## 2019-09-20 DIAGNOSIS — Z720 Tobacco use: Secondary | ICD-10-CM

## 2019-09-20 DIAGNOSIS — E1159 Type 2 diabetes mellitus with other circulatory complications: Secondary | ICD-10-CM

## 2019-09-20 DIAGNOSIS — I10 Essential (primary) hypertension: Secondary | ICD-10-CM

## 2019-09-20 LAB — BASIC METABOLIC PANEL
Anion gap: 12 (ref 5–15)
BUN: 13 mg/dL (ref 6–20)
CO2: 28 mmol/L (ref 22–32)
Calcium: 8.6 mg/dL — ABNORMAL LOW (ref 8.9–10.3)
Chloride: 87 mmol/L — ABNORMAL LOW (ref 98–111)
Creatinine, Ser: 1.27 mg/dL — ABNORMAL HIGH (ref 0.61–1.24)
GFR calc Af Amer: >60 mL/min (ref >60)
GFR calc non Af Amer: >60 mL/min (ref >60)
Glucose, Bld: 109 mg/dL — ABNORMAL HIGH (ref 70–99)
Potassium: 4.3 mmol/L (ref 3.5–5.1)
Sodium: 127 mmol/L — ABNORMAL LOW (ref 135–145)

## 2019-09-20 LAB — BRAIN NATRIURETIC PEPTIDE: B Natriuretic Peptide: 1431 pg/mL — ABNORMAL HIGH (ref 0.0–100.0)

## 2019-09-20 MED ORDER — POTASSIUM CHLORIDE CRYS ER 20 MEQ PO TBCR
EXTENDED_RELEASE_TABLET | ORAL | Status: AC
  Administered 2019-09-20: 13:00:00 40 meq via ORAL
  Filled 2019-09-20: qty 2

## 2019-09-20 MED ORDER — FUROSEMIDE 10 MG/ML IJ SOLN: 80.0000 mg | Freq: Once | INTRAMUSCULAR | Status: AC

## 2019-09-20 MED ORDER — POTASSIUM CHLORIDE CRYS ER 20 MEQ PO TBCR: 40.0000 meq | EXTENDED_RELEASE_TABLET | Freq: Once | ORAL | Status: AC

## 2019-09-20 MED ORDER — FUROSEMIDE 10 MG/ML IJ SOLN
INTRAMUSCULAR | Status: AC
  Administered 2019-09-20: 12:00:00 80 mg via INTRAVENOUS
  Filled 2019-09-20: qty 8

## 2019-09-20 MED ORDER — SODIUM CHLORIDE FLUSH 0.9 % IV SOLN
INTRAVENOUS | Status: AC
  Filled 2019-09-20: qty 10

## 2019-09-20 NOTE — Patient Instructions (Signed)
Continue weighing daily and call for an overnight weight gain of > 2 pounds or a weekly weight gain of >5 pounds. 

## 2019-09-21 ENCOUNTER — Ambulatory Visit (INDEPENDENT_AMBULATORY_CARE_PROVIDER_SITE_OTHER): Payer: Medicare Other | Admitting: Vascular Surgery

## 2019-09-21 ENCOUNTER — Other Ambulatory Visit: Payer: Self-pay | Admitting: Family

## 2019-09-21 ENCOUNTER — Encounter (INDEPENDENT_AMBULATORY_CARE_PROVIDER_SITE_OTHER): Payer: Medicare Other

## 2019-09-21 ENCOUNTER — Ambulatory Visit
Admission: RE | Admit: 2019-09-21 | Discharge: 2019-09-21 | Disposition: A | Discharge status: Home / Self Care | Payer: Medicare Other | Source: Ambulatory Visit | Attending: Family | Admitting: Family

## 2019-09-21 DIAGNOSIS — I5023 Acute on chronic systolic (congestive) heart failure: Secondary | ICD-10-CM | POA: Diagnosis present

## 2019-09-21 NOTE — Progress Notes (Signed)
Patient ID: [name] [name], male   DOB: 1961/05/07, 59 y.o.   MRN: [id number] Patient presented to ultrasound department today for paracentesis.  On limited ultrasound of abdomen in all 4 quadrants there is no significant ascites present.  Procedure canceled.  Patient informed.

## 2019-09-22 ENCOUNTER — Emergency Department: Payer: Medicare Other

## 2019-09-22 ENCOUNTER — Emergency Department
Admission: EM | Admit: 2019-09-22 | Discharge: 2019-09-22 | Disposition: A | Discharge status: Home / Self Care | Payer: Medicare Other | Attending: Emergency Medicine | Admitting: Emergency Medicine

## 2019-09-22 ENCOUNTER — Other Ambulatory Visit: Payer: Self-pay

## 2019-09-22 ENCOUNTER — Encounter: Payer: Self-pay | Admitting: Physician Assistant

## 2019-09-22 ENCOUNTER — Ambulatory Visit (INDEPENDENT_AMBULATORY_CARE_PROVIDER_SITE_OTHER): Payer: Medicare Other | Admitting: Physician Assistant

## 2019-09-22 VITALS — BP 110/78 | HR 106 | Ht 67.0 in | Wt 223.0 lb

## 2019-09-22 DIAGNOSIS — E8770 Fluid overload, unspecified: Secondary | ICD-10-CM | POA: Insufficient documentation

## 2019-09-22 DIAGNOSIS — J449 Chronic obstructive pulmonary disease, unspecified: Secondary | ICD-10-CM

## 2019-09-22 DIAGNOSIS — R2241 Localized swelling, mass and lump, right lower limb: Secondary | ICD-10-CM | POA: Insufficient documentation

## 2019-09-22 DIAGNOSIS — I739 Peripheral vascular disease, unspecified: Secondary | ICD-10-CM

## 2019-09-22 DIAGNOSIS — Z5321 Procedure and treatment not carried out due to patient leaving prior to being seen by health care provider: Secondary | ICD-10-CM | POA: Insufficient documentation

## 2019-09-22 DIAGNOSIS — E785 Hyperlipidemia, unspecified: Secondary | ICD-10-CM

## 2019-09-22 DIAGNOSIS — I5022 Chronic systolic (congestive) heart failure: Secondary | ICD-10-CM

## 2019-09-22 DIAGNOSIS — R1909 Other intra-abdominal and pelvic swelling, mass and lump: Secondary | ICD-10-CM | POA: Diagnosis not present

## 2019-09-22 DIAGNOSIS — I5023 Acute on chronic systolic (congestive) heart failure: Secondary | ICD-10-CM | POA: Diagnosis not present

## 2019-09-22 DIAGNOSIS — Z79899 Other long term (current) drug therapy: Secondary | ICD-10-CM

## 2019-09-22 DIAGNOSIS — Z720 Tobacco use: Secondary | ICD-10-CM

## 2019-09-22 DIAGNOSIS — R0789 Other chest pain: Secondary | ICD-10-CM

## 2019-09-22 DIAGNOSIS — Z794 Long term (current) use of insulin: Secondary | ICD-10-CM

## 2019-09-22 DIAGNOSIS — E1159 Type 2 diabetes mellitus with other circulatory complications: Secondary | ICD-10-CM

## 2019-09-22 DIAGNOSIS — L03116 Cellulitis of left lower limb: Secondary | ICD-10-CM

## 2019-09-22 DIAGNOSIS — Z992 Dependence on renal dialysis: Secondary | ICD-10-CM | POA: Insufficient documentation

## 2019-09-22 LAB — CBC
HCT: 37.1 % — ABNORMAL LOW (ref 39.0–52.0)
Hemoglobin: 11.7 g/dL — ABNORMAL LOW (ref 13.0–17.0)
MCH: 22.8 pg — ABNORMAL LOW (ref 26.0–34.0)
MCHC: 31.5 g/dL (ref 30.0–36.0)
MCV: 72.2 fL — ABNORMAL LOW (ref 80.0–100.0)
Platelets: 200 10*3/uL (ref 150–400)
RBC: 5.14 MIL/uL (ref 4.22–5.81)
RDW: 23.9 % — ABNORMAL HIGH (ref 11.5–15.5)
WBC: 6.5 10*3/uL (ref 4.0–10.5)
nRBC: 0 % (ref 0.0–0.2)

## 2019-09-22 LAB — BASIC METABOLIC PANEL
Anion gap: 11 (ref 5–15)
BUN: 16 mg/dL (ref 6–20)
CO2: 30 mmol/L (ref 22–32)
Calcium: 9 mg/dL (ref 8.9–10.3)
Chloride: 88 mmol/L — ABNORMAL LOW (ref 98–111)
Creatinine, Ser: 1.17 mg/dL (ref 0.61–1.24)
GFR calc Af Amer: >60 mL/min (ref >60)
GFR calc non Af Amer: >60 mL/min (ref >60)
Glucose, Bld: 75 mg/dL (ref 70–99)
Potassium: 4.1 mmol/L (ref 3.5–5.1)
Sodium: 129 mmol/L — ABNORMAL LOW (ref 135–145)

## 2019-09-22 LAB — BRAIN NATRIURETIC PEPTIDE: B Natriuretic Peptide: 2365 pg/mL — ABNORMAL HIGH (ref 0.0–100.0)

## 2019-09-22 NOTE — Progress Notes (Addendum)
Office Visit    Patient Name: [name] [name] Date of Encounter: 09/22/2019  Primary Care Provider:  [name], MD Primary Cardiologist:  Ida Rogue, MD  Chief Complaint    Chief Complaint  Patient presents with  . OTHER    CHF c/o chest pain and edema left leg and swelling around lower back and pain. Meds reviewed verbally with pt.    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  Past Medical History    Past Medical History:  Diagnosis Date  . Arthritis   . Asthma   . Atherosclerosis   . BPH (benign prostatic hyperplasia)   . Carotid arterial disease ([place])   . Charcot's joint of foot, right   . Chronic combined systolic (congestive) and diastolic (congestive) heart failure ([place])    a. 02/2013 EF 50% by LV gram; b. 04/2017 Echo: EF 25-30%. diff HK. Gr2 DD. Mod dil LA/RV. PASP 44mmHg.  [name] COPD (chronic obstructive pulmonary disease) ([place])   . Coronary artery disease    a. 2013 S/P PCI of LAD [facility]);  b. 03/2013 PCI: RCA 90p ( 2.5 x 23 mm DES); c. 02/2014 Cath: patent RCA stent->Med Rx; d. 04/2017 Cath: LM nl, LAd 30ost/p, 79m/d, D1 80ost, LCX 90p/m, OM1 85, RCA 70/20p, 59m, 70d->Referred for CT Surg-felt to be poor candidate.  . Diabetic neuropathy ([place])   . Diabetic ulcer of right foot ([place])   . Hernia   . Hyperlipidemia   . Hypertension   . Ischemic cardiomyopathy    a. 04/2017 Echo: EF 25-30%; b. 08/2017 Cardiac MRI ([facility]): EF 19%, sev glob HK.  RVEF 20%, mod BAE, triv MR, mild-mod TR. Basal lateral subendocardial infarct (viable), inf/infsept, dist septal ischemia, basal to mid lat peri-infarct ischemia.  . Morbid obesity ([place])   . Neuropathy   . PAD (peripheral artery disease) ([place])    a. Followed by Dr. Lucky Cowboy; b. 08/2010 Periph Angio: RSFA 70-80p (6X60 self-expanding stent); c. 09/2010 Periph Angio: L SFA 100p (7x unknown length self-expanding stent); d. 06/2015 Periph Angio: R SFA short segment occlusion (6x12 self-expanding stent).  . Restless leg syndrome   . Sleep apnea   . Subclavian artery stenosis, right ([place])   . Syncope and collapse   . Tobacco use    a. 75+ yr hx - still smoking 1ppd, down from 2 ppd.  . Type II diabetes mellitus ([place])   . Varicose vein    Past Surgical History:  Procedure Laterality Date  . ABDOMINAL AORTAGRAM N/A 06/23/2013   Procedure: ABDOMINAL Maxcine Ham;  Surgeon: Wellington Hampshire, MD;  Location: [place] CATH LAB;  Service: Cardiovascular;  Laterality: N/A;  . AMPUTATION Right 04/01/2018   Procedure: AMPUTATION BELOW KNEE;  Surgeon: Algernon Huxley, MD;  Location: [facility] ORS;  Service: Vascular;  Laterality: Right;  . APPENDECTOMY    . CARDIAC CATHETERIZATION  10/14   [facility]; X1 STENT PROXIMAL RCA  . CARDIAC CATHETERIZATION  02/2010   [facility]  . CARDIAC CATHETERIZATION  02/21/2014   [facility]  . CLAVICLE SURGERY    .  HERNIA REPAIR    . IRRIGATION AND DEBRIDEMENT FOOT Right 01/04/2018   Procedure: IRRIGATION AND DEBRIDEMENT FOOT;  Surgeon: Samara Deist, DPM;  Location: [facility] ORS;  Service: Podiatry;  Laterality: Right;  . LEFT HEART CATH AND CORS/GRAFTS ANGIOGRAPHY N/A 04/28/2017   Procedure: LEFT HEART CATH AND CORONARY ANGIOGRAPHY;  Surgeon: Wellington Hampshire, MD;  Location: [place] CV LAB;  Service: Cardiovascular;  Laterality: N/A;  . LOWER EXTREMITY ANGIOGRAPHY Right 03/03/2018   Procedure: LOWER EXTREMITY ANGIOGRAPHY;  Surgeon: Katha Cabal, MD;  Location: [place] CV LAB;  Service:  Cardiovascular;  Laterality: Right;  . LOWER EXTREMITY ANGIOGRAPHY Left 08/24/2018   Procedure: LOWER EXTREMITY ANGIOGRAPHY;  Surgeon: Algernon Huxley, MD;  Location: [place] CV LAB;  Service: Cardiovascular;  Laterality: Left;  . LOWER EXTREMITY ANGIOGRAPHY Left 06/14/2019   Procedure: LOWER EXTREMITY ANGIOGRAPHY;  Surgeon: Algernon Huxley, MD;  Location: [place] CV LAB;  Service: Cardiovascular;  Laterality: Left;  . LOWER EXTREMITY ANGIOGRAPHY Left 08/02/2019   Procedure: LOWER EXTREMITY ANGIOGRAPHY;  Surgeon: Algernon Huxley, MD;  Location: [place] CV LAB;  Service: Cardiovascular;  Laterality: Left;  . PERIPHERAL ARTERIAL STENT GRAFT     x2 left/right  . PERIPHERAL VASCULAR BALLOON ANGIOPLASTY Right 01/06/2018   Procedure: PERIPHERAL VASCULAR BALLOON ANGIOPLASTY;  Surgeon: Katha Cabal, MD;  Location: [place] CV LAB;  Service: Cardiovascular;  Laterality: Right;  . PERIPHERAL VASCULAR CATHETERIZATION N/A 07/06/2015   Procedure: Abdominal Aortogram w/Lower Extremity;  Surgeon: Algernon Huxley, MD;  Location: [place] CV LAB;  Service: Cardiovascular;  Laterality: N/A;  . PERIPHERAL VASCULAR CATHETERIZATION  07/06/2015   Procedure: Lower Extremity Intervention;  Surgeon: Algernon Huxley, MD;  Location: [place] CV LAB;  Service: Cardiovascular;;    Allergies  Allergies  Allergen Reactions  . Dulaglutide Anaphylaxis, Diarrhea and Hives  . Other Itching    Skin itching associated with nitro patch  . Prednisone Rash    History of Present Illness    [name] [name] is a 59 y.o. male with PMH as above.  He is not very active and typically moves around with a wheelchair.  He has a history of cardiac cath 04/2017 as below with significant 3 V CAD and severely elevated LVEDP at 34 mmHg.  11/06/2018 echo showed EF 20 to 25% & RVSP 59.6 mmHg.  On review of previous progress notes, he has a history of confusion regarding which medications he should be taking noted in the past. He  follows with [facility] Lucky [facility]boy [facility] with recent intervention 06/14/2019 and involving the left common iliac artery/external iliac artery stent with left SFA, popliteal, TP trunk, and PTA angioplasties. He has followed with [facility] for left heel wound as well.  He was started on antibiotics initially for left heel wound by [facility].  Most recent ABIs and TBI's under CV studies.     He was seen 07/02/2019 with DOE and weight 209 pounds.  He was felt to be well compensated on exam.  He was continued on torsemide 40 mg in the morning and 20 mg in the evening, as well as spironolactone 25 mg daily.    He was seen in the ED 08/30/2019 for left leg cellulitis and edema but left AMA.   CTA chest with moderate right pleural effusion.  He underwent IV diuresis.    He was seen in clinic 08/31/2019 and reported taking torsemide 40 mg once daily with weight increased from 209 pounds to 217 pounds  and thought to be volume overloaded.  Recommendation was for and extra torsemide 40 mg each day until improvement in symptoms (torsemide 40 mg twice daily).  After that time, he was seen several times by both the heart failure clinic and [facility] emergency department with periodic IV diuresis and patient reporting this would only alleviate his symptoms for 1 to 2 days.  He was in the ED 09/10/2019 and stated that he had not taken his fluid pills for some time. He received IV Lasix.  He returned to the ED 09/17/2019 with BNP 1294.0.  He was last seen by the heart failure clinic 09/20/2019 and still volume overloaded with subsequent IV Lasix 80 mg and potassium repletion.  Today, 09/22/2019, he presents with significant volume overload.  He reports progressive SOB, DOE, abdominal distention, L sided LEE, weight gain, chest pain across his anterior chest and slightly relieved with nitro patch (present on exam today), fatigue, and orthopnea.  On exam, left lower extremity 3+ pitting edema with erythema and warmth extending from the knee down through his foot  and up from his knee and along his medial thigh.  Pitting edema noted to extend throughout the length of his left lower extremity and into his lower back.  ReDS 52% and consistent with severe volume overload with recommendation to present to the emergency department for close monitoring of renal function and blood pressure given he has failed outpatient diuresis at this time.  Before walking him over to the ED, we reviewed his current diet and volume intake.  He stated he was working to purchase the heart healthy pre-packaged foods with recommendation to keep track of the actual milligrams of sodium rather than those prepackaged foods with heart on the front of them, as these can often be misleading and not always be lower in sodium than that of the competitors.  Stressed that he should start checking each prepackaged foods label and telling up his sodium with recommendation to keep his sodium under 2 g daily.  Recommended that he not add extra salt to his food and use Mrs. Deliah Boston going forward.  He estimated approximately 64 ounces of fluid/water per day with recommendation to decrease his fluid to under this amount and use ice chips or popsicles if dry mouth. No recent racing heart rate, palpitations, presyncope, syncope, or mechanical falls.  He reported medication compliance with torsemide 40 mg twice daily but poor urine output.  Of note, his weight when well compensated on exam was reportedly 209lbs (07/02/2019) 223 pounds today.  Home Medications    Prior to Admission medications   Medication Sig Start Date End Date Taking? Authorizing Provider  albuterol (VENTOLIN HFA) 108 (90 Base) MCG/ACT inhaler Inhale into the lungs every 6 (six) hours as needed for wheezing or shortness of breath.   Yes [provider]  aspirin EC 81 MG tablet Take 1 tablet (81 mg total) by mouth daily. 05/11/15  Yes Wellington Hampshire, MD  atorvastatin (LIPITOR) 80 MG tablet Take 80 mg by mouth daily.   Yes [provider]  calcium carbonate (TUMS - DOSED IN MG ELEMENTAL CALCIUM) 500 MG chewable tablet Chew 4 tablets by mouth daily as needed for indigestion or heartburn.   Yes [provider]  clopidogrel (PLAVIX) 75 MG tablet Take 75 mg by mouth daily.  08/22/17  Yes [provider]  collagenase (SANTYL) ointment Apply 1 application topically daily. 08/11/19  Yes Kris Hartmann, NP  doxycycline (VIBRAMYCIN) 100 MG capsule Take  1 capsule (100 mg total) by mouth 2 (two) times daily. 09/08/19  Yes Kris Hartmann, NP  ENTRESTO 24-26 MG TAKE 1 TABLET BY MOUTH TWICE DAILY 06/19/19  Yes Darylene Price A, FNP  ezetimibe (ZETIA) 10 MG tablet Take 1 tablet (10 mg total) by mouth daily. 05/19/19  Yes Hackney, Otila Kluver A, FNP  gabapentin (NEURONTIN) 800 MG tablet Take 800 mg by mouth 4 (four) times daily.  08/14/17 09/22/19 Yes [provider]  Insulin Glargine (LANTUS SOLOSTAR) 100 UNIT/ML Solostar Pen Inject 30 Units into the skin at bedtime. Patient taking differently: Inject 20-40 Units into the skin 2 (two) times daily. 40 units in the morning and 20 units at night 04/07/18  Yes Mayo, Pete Pelt, MD  insulin lispro (HUMALOG) 100 UNIT/ML injection Sliding Scale Insulin before meals using Novolog If BS is 0 to 70 no insulin and eat/glucose tablets/glucose get If BS 71 to 150 great! If BS 151 to 200, 2 Units Novolog If BS 201 to 250, 4 Units Novolog If BS 251 to 300, 6 Units Novolog If BS 301 to 350, 8 Units Novolog If BS 351 to 400, 10 Units Novolog If BS greater than 400, take 10 Units and Contact our office during office hour 04/14/18  Yes [provider]  ipratropium-albuterol (DUONEB) 0.5-2.5 (3) MG/3ML SOLN Inhale 3 mLs into the lungs every 6 (six) hours as needed (wheezing/sob).    Yes [provider]  isosorbide mononitrate (IMDUR) 30 MG 24 hr tablet Take 1 tablet (30 mg total) by mouth daily. 05/19/19  Yes Hackney, Otila Kluver A, FNP  metFORMIN (GLUCOPHAGE-XR) 500 MG 24 hr tablet Take  1,000 mg by mouth 2 (two) times daily.    Yes [provider]  metoprolol succinate (TOPROL-XL) 25 MG 24 hr tablet Take 1 tablet (25 mg total) by mouth daily. 07/02/19 08/29/20 Yes Loel Dubonnet, NP  Multiple Vitamin (MULTI-VITAMINS) TABS Take 1 tablet by mouth daily.    Yes [provider]  nitroGLYCERIN (NITRODUR - DOSED IN MG/24 HR) 0.4 mg/hr patch Place 0.4 mg onto the skin daily.   Yes [provider]  nitroGLYCERIN (NITROSTAT) 0.4 MG SL tablet Place 1 tablet (0.4 mg total) under the tongue every 5 (five) minutes as needed for chest pain. 05/19/19  Yes Darylene Price A, FNP  Oxycodone HCl 10 MG TABS Take 10 mg by mouth every 6 (six) hours as needed (pain).  01/15/18  Yes [provider]  potassium chloride (KLOR-CON) 10 MEQ tablet Take 1 tablet (10 mEq total) by mouth daily. 05/19/19  Yes Hackney, Tina A, FNP  promethazine (PHENERGAN) 25 MG tablet TAKE 1 TABLET BY MOUTH EVERY 6 HOURS AS NEEDED NAUSEA 04/29/18  Yes [provider]  spironolactone (ALDACTONE) 25 MG tablet Take 25 mg by mouth daily.  03/02/18  Yes [provider]  sulfamethoxazole-trimethoprim (BACTRIM DS) 800-160 MG tablet Take 1 tablet by mouth 2 (two) times daily. 07/22/19  Yes Kris Hartmann, NP  torsemide (DEMADEX) 20 MG tablet Take 2 tablets (40 mg total) by mouth 2 (two) times daily. 40mg  AM/ 20mg  PM Patient taking differently: Take 40 mg by mouth 2 (two) times daily.  08/31/19  Yes Minna Merritts, MD  traZODone (DESYREL) 150 MG tablet Take 150 mg by mouth at bedtime. 07/12/19  Yes [provider]    Review of Systems    He denies palpitations,  pnd, n, v, dizziness, syncope, or early satiety.  He reports chest pain, dyspnea, shortness of breath,  orthopnea, edema (left lower extremity edema), left lower extremity erythema and warmth, weight gain.   All other systems reviewed and are otherwise negative except as noted above.  Physical Exam    VS:  BP 110/78 (BP  Location: Right Arm, Patient Position: Sitting, Cuff Size: Normal)   Pulse (!) 106   Ht 5\' 7"  (1.702 m)   Wt 223 lb (101.2 kg)   SpO2 98%   BMI 34.93 kg/m  , BMI Body mass index is 34.93 kg/m. GEN: Obese male, uncomfortable while seated on exam table. HEENT: normal. Neck: Supple, JVD difficult to assess due to body habitus.  No carotid bruits, or masses. Cardiac: Tachycardic and regular. no murmurs, rubs, or gallops. No clubbing, cyanosis.  Lower extremity 3+ pitting edema extending up into the thigh and lower back.  Radials 1+ and equal bilaterally.  Respiratory: Reduced bibasilar breath sounds and worse on right side when compared with that of the left.  Coarse breath sounds rhonchi, and wheezing bilaterally. GI: Significantly distended. MS: Left lower extremity edema 3+ on exam.  Erythema/warmth that extends from the knee down through the foot - ulcerations with left-sided tibial ulcerations and heel ulceration.  Erythema also noted to extend along the medial thigh.  Lower extremity edema extending into the back. Skin: warm and dry, vascular changes noted on left lower extremity below the knee Neuro: No sensation in his left lower extremity Psych: Normal affect.  Accessory Clinical Findings    ECG personally reviewed by me today -sinus tachycardia, 106 bpm, left atrial enlargement, LAD, RBBB with QRS 146 ms, prolonged PR interval at 180 ms, prolonged QTC at 499 ms, nonspecific ST and T changes, poor R wave progression through the precordial leads, T wave abnormality/nonspecific ST/T changes noted in the lateral, septal, and anterior leads- no acute changes when compared with previous EKGs/tracings.  VITALS Reviewed today   Temp Readings from Last 3 Encounters:  09/22/19 98.2 F (36.8 C) (Oral)  09/10/19 98.9 F (37.2 C)  08/30/19 98.8 F (37.1 C) (Oral)   BP Readings from Last 3 Encounters:  09/22/19 96/67  09/22/19 110/78  09/20/19 106/77   Pulse Readings from Last 3  Encounters:  09/22/19 100  09/22/19 (!) 106  09/20/19 (!) 108    Wt Readings from Last 3 Encounters:  09/22/19 222 lb 10.6 oz (101 kg)  09/22/19 223 lb (101.2 kg)  09/20/19 224 lb 2 oz (101.7 kg)     LABS  reviewed today   [place] present and most recent? Yes/No: No  Lab Results  Component Value Date   WBC 5.4 09/17/2019   HGB 11.7 (L) 09/17/2019   HCT 36.5 (L) 09/17/2019   MCV 72.6 (L) 09/17/2019   PLT 160 09/17/2019   Lab Results  Component Value Date   CREATININE 1.27 (H) 09/20/2019   BUN 13 09/20/2019   NA 127 (L) 09/20/2019   K 4.3 09/20/2019   CL 87 (L) 09/20/2019   CO2 28 09/20/2019   Lab Results  Component Value Date   ALT 10 09/17/2019   AST 23 09/17/2019   ALKPHOS 93 09/17/2019   BILITOT 1.2 09/17/2019   Lab Results  Component Value Date   CHOL 80 11/09/2018   HDL 26 (L) 11/09/2018   LDLCALC 46 11/09/2018   TRIG 42 11/09/2018   CHOLHDL 3.1 11/09/2018    Lab Results  Component Value Date   HGBA1C 8.5 (H) 11/21/2017   Lab Results  Component Value Date   TSH 0.533 12/21/2017  STUDIES/PROCEDURES reviewed today   LE arterial study - 09/08/19 Summary:  Left: Patent Left LE arterial system from EIA to distal PTA/ATA. New SFA  to TP trunk stent is patent with no evidence of stenosis. Spot check of  lower extremity venous system shows no evidence of DVT. Lymph nodes in  groin show multiple enlarged nodes.   ABIs- 08/11/19 Summary:  Left: Resting left ankle-brachial index indicates mild left lower  extremity arterial disease. The left toe-brachial index is abnormal.   10/2018 TTE 1. The left ventricle has severely reduced systolic function, with an  ejection fraction of 20-25%. The cavity size was moderately dilated.  Indeterminate diastolic filling due to E-A fusion. Left ventricular  diffuse hypokinesis.  2. The right ventricle has severely reduced systolic function. The cavity  was mildly enlarged. There is no increase in  right ventricular wall  thickness. Right ventricular systolic pressure is moderately elevated with  an estimated pressure of 59.6 mmHg.  3. Left atrial size was mildly dilated.  4. Right atrial size was mildly dilated.  5. The mitral valve is degenerative. Mild thickening of the mitral valve  leaflet. Mild calcification of the mitral valve leaflet. There is mild  mitral annular calcification present.  6. The tricuspid valve is degenerative. Tricuspid valve regurgitation is  moderate.  7. The aortic valve was not well visualized. Moderate thickening of the  aortic valve. Moderate calcification of the aortic valve.  8. The inferior vena cava was dilated in size with <50% respiratory  variability.   08/2017 Cardiac MRI: [facility]. The left ventricle is moderately dilated in cavity size with normal wall thickness. Global systolic function is severely reduced with an LV ejection fraction calculated at 19%. There is severe hypokinesis globally. 2. The right ventricle is moderately dilated in cavity size with normal wall thickness. Global RV systolic function is severely reduced with an RVEF calculated at 20%.  3. Both atria are moderately enlarged in size. 4. The aortic valve is trileaflet in morphology. There is no significant aortic valve stenosis or regurgitation.  5. There is trivial mitral regurgitation. There is mild-moderate tricuspid regurgitation. 6. Delayed enhancement imaging is mildly abnormal. There is a small thin layer of subendocardial infarction in the basal lateral wall. There is significant viability in all coronary territories. 7. Adenosine stress perfusion imaging is abnormal with stress perfusion defects indicating inducible myocardial ischemia in: a) the entire inferior and inferoseptal walls, consistent with the RCA perfusion territory. b) the basal-to-mid lateral wall, suggestive of peri-infarct ischemia in the LCx perfusion territory. c) the  distal septal wall, consistent with the distal LAD territory. 8. No intra-cardiac thrombus visualized. 9. There is a small circumferential pericardial effusion. There are small bilateral pleural effusions.  LHC 04/2017  Prox RCA-1 lesion is 70% stenosed.  Prox RCA-2 lesion is 20% stenosed.  Mid RCA lesion is 80% stenosed.  Mid RCA to Dist RCA lesion is 70% stenosed.  Ost LAD to Prox LAD lesion is 30% stenosed.  Ost 1st Diag to 1st Diag lesion is 80% stenosed.  Prox LAD to Mid LAD lesion is 60% stenosed.  Dist LAD lesion is 60% stenosed.  Prox Cx to Mid Cx lesion is 90% stenosed.  Ost 1st Mrg to 1st Mrg lesion is 85% stenosed. 1. Significant three-vessel coronary artery disease with patent stent in the RCA and LAD. There is significant proximal RCA disease before the stent as well as diffuse mid and distal disease in a vessel that appears to be about  2.5 mm in diameter. There is significant bifurcation stenosis in the proximal left circumflex with a large OM1. The LAD has moderate disease. The coronary arteries are moderately calcified and diffusely diseased throughout. 2. Left ventricular angiography was not performed. EF was moderately to severely reduced by echo. 3. Severely elevated left ventricular end-diastolic pressure at 34 mmHg. Recommendations: This is overall a difficult situation. Revascularization options are somewhat limited due to diffuse disease overall. Given that he is diabetic and has cardiomyopathy, best option might be CABG if he is found to be a candidate. We need to optimize his heart failure first with outpatient referral to cardiothoracic surgery for evaluation.  I am going to increase intravenous diuresis today and the patient possibly can be discharged home tomorrow on medical therapy.   Assessment & Plan    Acute on chronic systolic congestive heart failure Pulmonary hypertension, ICM --Significant symptoms of overload with chest pain,  abdominal distention, LEE, SOB/DOE, and edema. Poor urine output on oral torsemide 40 mg BID (compliance not clear).  He has been followed by the heart failure clinic and  ED with several rounds of IV diuresis with relief that last between 1 to 2 days and progressive increase in volume.  Reds vest 52% and wt increased from 1/22 weight of 209 LBS to 223 LBS today.   --At this time, he has failed outpatient diuresis. Recommendation was to go to [facility] emergency department for aggressive IV diuresis with KCl supplementation and close monitoring of vitals along with renal function/electrolytes.  Recommend daily BMET, trending BNP, I/Os, and daily standing weights.   --Recommend updating 10/2018 echo as above and consider RHC or L/RHC for better understanding of patient heart pressures and hemodynamics.  2018 cath as above with h/o CAD as below and in HPI. --We will plan for follow-up s/p discharge.  As above in HPI, heart failure DASH diet and volume recommendations reviewed in detail.  Atypical chest pain, likely 2/2 volume overload History of CAD / ICM --Suspect that chest pain reported today is 2/2 heart failure exacerbation.  Continue beta-blocker, Imdur, and nitro patch for antianginal effect as BP allows.  Continue GDMT and escalate as BP and renal function allows.  Continue ASA and clopidogrel with daily CBC. Statin and Zetia should be continued for secondary prevention of CAD and PAD.  Recommendations regarding further ischemic work-up per rounding cardiology team.  Left lower extremity cellulitis --On review of EMR, he has been on antibiotics for some time for left lower extremity cellulitis.  Ulcerations noted on exam, as well as erythema/warmth.  Continue antibiotics.  Will defer further management to internal medicine.  Remainder per IM   Arvil Chaco, PA-C 09/22/2019

## 2019-09-22 NOTE — Patient Instructions (Signed)
Medication Instructions:  No changes *If you need a refill on your cardiac medications before your next appointment, please call your pharmacy*   Lab Work: None ordered  If you have labs (blood work) drawn today and your tests are completely normal, you will receive your results only by: [name] MyChart Message (if you have MyChart) OR . A paper copy in the mail If you have any lab test that is abnormal or we need to change your treatment, we will call you to review the results.   Testing/Procedures: None ordered    Follow-Up: At [facility], you and your health needs are our priority.  As part of our continuing mission to provide you with exceptional heart care, we have created designated Provider Care Teams.  These Care Teams include your primary Cardiologist (physician) and Advanced Practice Providers (APPs -  Physician Assistants and Nurse Practitioners) who all work together to provide you with the care you need, when you need it.  We recommend signing up for the patient portal called "MyChart".  Sign up information is provided on this After Visit Summary.  MyChart is used to connect with patients for Virtual Visits (Telemedicine).  Patients are able to view lab/test results, encounter notes, upcoming appointments, etc.  Non-urgent messages can be sent to your provider as well.   To learn more about what you can do with MyChart, go to [email or web address].    Your next appointment:   As advised per hospital MD

## 2019-09-22 NOTE — ED Triage Notes (Signed)
FIRST NURSE NOTE- here from heart failure clinic for fluid overload.  Alert at this time.

## 2019-09-22 NOTE — ED Notes (Signed)
Called pt from lobby to be taken to exam room. Unable to locate pt at this time.

## 2019-09-22 NOTE — ED Triage Notes (Signed)
Pt comes POV from the heart failure clinic. Pt has swelling in right leg (left leg amputated) and loer abdomen. Pt states this is not new but lasix hasn't been working and MD wants pt admitted.

## 2019-09-22 NOTE — ED Notes (Signed)
Pt called from lobby to be taken to exam room. Unable to locate pt.

## 2019-09-23 ENCOUNTER — Encounter (INDEPENDENT_AMBULATORY_CARE_PROVIDER_SITE_OTHER): Payer: Medicare Other

## 2019-09-26 ENCOUNTER — Emergency Department: Payer: Medicare Other

## 2019-09-26 ENCOUNTER — Other Ambulatory Visit: Payer: Self-pay

## 2019-09-26 ENCOUNTER — Encounter: Payer: Self-pay | Admitting: Emergency Medicine

## 2019-09-26 DIAGNOSIS — I2510 Atherosclerotic heart disease of native coronary artery without angina pectoris: Secondary | ICD-10-CM | POA: Diagnosis present

## 2019-09-26 DIAGNOSIS — E1140 Type 2 diabetes mellitus with diabetic neuropathy, unspecified: Secondary | ICD-10-CM | POA: Diagnosis present

## 2019-09-26 DIAGNOSIS — I252 Old myocardial infarction: Secondary | ICD-10-CM

## 2019-09-26 DIAGNOSIS — E1122 Type 2 diabetes mellitus with diabetic chronic kidney disease: Secondary | ICD-10-CM | POA: Diagnosis present

## 2019-09-26 DIAGNOSIS — Z6833 Body mass index (BMI) 33.0-33.9, adult: Secondary | ICD-10-CM

## 2019-09-26 DIAGNOSIS — M5030 Other cervical disc degeneration, unspecified cervical region: Secondary | ICD-10-CM | POA: Diagnosis present

## 2019-09-26 DIAGNOSIS — Z79891 Long term (current) use of opiate analgesic: Secondary | ICD-10-CM

## 2019-09-26 DIAGNOSIS — E11621 Type 2 diabetes mellitus with foot ulcer: Secondary | ICD-10-CM | POA: Diagnosis present

## 2019-09-26 DIAGNOSIS — D649 Anemia, unspecified: Secondary | ICD-10-CM | POA: Diagnosis present

## 2019-09-26 DIAGNOSIS — I130 Hypertensive heart and chronic kidney disease with heart failure and stage 1 through stage 4 chronic kidney disease, or unspecified chronic kidney disease: Secondary | ICD-10-CM | POA: Diagnosis present

## 2019-09-26 DIAGNOSIS — Z794 Long term (current) use of insulin: Secondary | ICD-10-CM

## 2019-09-26 DIAGNOSIS — M4802 Spinal stenosis, cervical region: Secondary | ICD-10-CM | POA: Diagnosis present

## 2019-09-26 DIAGNOSIS — R079 Chest pain, unspecified: Secondary | ICD-10-CM | POA: Diagnosis not present

## 2019-09-26 DIAGNOSIS — I2729 Other secondary pulmonary hypertension: Secondary | ICD-10-CM | POA: Diagnosis present

## 2019-09-26 DIAGNOSIS — E662 Morbid (severe) obesity with alveolar hypoventilation: Secondary | ICD-10-CM | POA: Diagnosis present

## 2019-09-26 DIAGNOSIS — Z888 Allergy status to other drugs, medicaments and biological substances status: Secondary | ICD-10-CM

## 2019-09-26 DIAGNOSIS — I255 Ischemic cardiomyopathy: Secondary | ICD-10-CM | POA: Diagnosis present

## 2019-09-26 DIAGNOSIS — I4510 Unspecified right bundle-branch block: Secondary | ICD-10-CM | POA: Diagnosis present

## 2019-09-26 DIAGNOSIS — Z9049 Acquired absence of other specified parts of digestive tract: Secondary | ICD-10-CM

## 2019-09-26 DIAGNOSIS — Z8249 Family history of ischemic heart disease and other diseases of the circulatory system: Secondary | ICD-10-CM

## 2019-09-26 DIAGNOSIS — Z7982 Long term (current) use of aspirin: Secondary | ICD-10-CM

## 2019-09-26 DIAGNOSIS — G894 Chronic pain syndrome: Secondary | ICD-10-CM | POA: Diagnosis present

## 2019-09-26 DIAGNOSIS — I6529 Occlusion and stenosis of unspecified carotid artery: Secondary | ICD-10-CM | POA: Diagnosis present

## 2019-09-26 DIAGNOSIS — Z83438 Family history of other disorder of lipoprotein metabolism and other lipidemia: Secondary | ICD-10-CM

## 2019-09-26 DIAGNOSIS — Z955 Presence of coronary angioplasty implant and graft: Secondary | ICD-10-CM

## 2019-09-26 DIAGNOSIS — L97429 Non-pressure chronic ulcer of left heel and midfoot with unspecified severity: Secondary | ICD-10-CM | POA: Diagnosis present

## 2019-09-26 DIAGNOSIS — Z8619 Personal history of other infectious and parasitic diseases: Secondary | ICD-10-CM

## 2019-09-26 DIAGNOSIS — M4696 Unspecified inflammatory spondylopathy, lumbar region: Secondary | ICD-10-CM | POA: Diagnosis present

## 2019-09-26 DIAGNOSIS — N183 Chronic kidney disease, stage 3 unspecified: Secondary | ICD-10-CM | POA: Diagnosis present

## 2019-09-26 DIAGNOSIS — J441 Chronic obstructive pulmonary disease with (acute) exacerbation: Secondary | ICD-10-CM | POA: Diagnosis present

## 2019-09-26 DIAGNOSIS — E871 Hypo-osmolality and hyponatremia: Secondary | ICD-10-CM | POA: Diagnosis present

## 2019-09-26 DIAGNOSIS — I5043 Acute on chronic combined systolic (congestive) and diastolic (congestive) heart failure: Secondary | ICD-10-CM | POA: Diagnosis present

## 2019-09-26 DIAGNOSIS — Z20822 Contact with and (suspected) exposure to covid-19: Secondary | ICD-10-CM | POA: Diagnosis present

## 2019-09-26 DIAGNOSIS — Z95820 Peripheral vascular angioplasty status with implants and grafts: Secondary | ICD-10-CM

## 2019-09-26 DIAGNOSIS — E785 Hyperlipidemia, unspecified: Secondary | ICD-10-CM | POA: Diagnosis present

## 2019-09-26 DIAGNOSIS — Z89511 Acquired absence of right leg below knee: Secondary | ICD-10-CM

## 2019-09-26 DIAGNOSIS — L03116 Cellulitis of left lower limb: Principal | ICD-10-CM | POA: Diagnosis present

## 2019-09-26 DIAGNOSIS — Z9119 Patient's noncompliance with other medical treatment and regimen: Secondary | ICD-10-CM

## 2019-09-26 DIAGNOSIS — E1151 Type 2 diabetes mellitus with diabetic peripheral angiopathy without gangrene: Secondary | ICD-10-CM | POA: Diagnosis present

## 2019-09-26 DIAGNOSIS — I472 Ventricular tachycardia: Secondary | ICD-10-CM | POA: Diagnosis not present

## 2019-09-26 DIAGNOSIS — I872 Venous insufficiency (chronic) (peripheral): Secondary | ICD-10-CM | POA: Diagnosis present

## 2019-09-26 DIAGNOSIS — M19011 Primary osteoarthritis, right shoulder: Secondary | ICD-10-CM | POA: Diagnosis present

## 2019-09-26 DIAGNOSIS — Z79899 Other long term (current) drug therapy: Secondary | ICD-10-CM

## 2019-09-26 DIAGNOSIS — F17210 Nicotine dependence, cigarettes, uncomplicated: Secondary | ICD-10-CM | POA: Diagnosis present

## 2019-09-26 DIAGNOSIS — Z7902 Long term (current) use of antithrombotics/antiplatelets: Secondary | ICD-10-CM

## 2019-09-26 DIAGNOSIS — I081 Rheumatic disorders of both mitral and tricuspid valves: Secondary | ICD-10-CM | POA: Diagnosis present

## 2019-09-26 DIAGNOSIS — G2581 Restless legs syndrome: Secondary | ICD-10-CM | POA: Diagnosis present

## 2019-09-26 LAB — CBC
HCT: 36.2 % — ABNORMAL LOW (ref 39.0–52.0)
Hemoglobin: 11.4 g/dL — ABNORMAL LOW (ref 13.0–17.0)
MCH: 22.7 pg — ABNORMAL LOW (ref 26.0–34.0)
MCHC: 31.5 g/dL (ref 30.0–36.0)
MCV: 72 fL — ABNORMAL LOW (ref 80.0–100.0)
Platelets: 188 10*3/uL (ref 150–400)
RBC: 5.03 MIL/uL (ref 4.22–5.81)
RDW: 23.5 % — ABNORMAL HIGH (ref 11.5–15.5)
WBC: 6.4 10*3/uL (ref 4.0–10.5)
nRBC: 0 % (ref 0.0–0.2)

## 2019-09-26 LAB — TROPONIN I (HIGH SENSITIVITY): Troponin I (High Sensitivity): 32 ng/L — ABNORMAL HIGH (ref <18)

## 2019-09-26 LAB — BASIC METABOLIC PANEL
Anion gap: 11 (ref 5–15)
BUN: 15 mg/dL (ref 6–20)
CO2: 29 mmol/L (ref 22–32)
Calcium: 8.6 mg/dL — ABNORMAL LOW (ref 8.9–10.3)
Chloride: 88 mmol/L — ABNORMAL LOW (ref 98–111)
Creatinine, Ser: 1.15 mg/dL (ref 0.61–1.24)
GFR calc Af Amer: >60 mL/min (ref >60)
GFR calc non Af Amer: >60 mL/min (ref >60)
Glucose, Bld: 151 mg/dL — ABNORMAL HIGH (ref 70–99)
Potassium: 3.7 mmol/L (ref 3.5–5.1)
Sodium: 128 mmol/L — ABNORMAL LOW (ref 135–145)

## 2019-09-26 LAB — BRAIN NATRIURETIC PEPTIDE: B Natriuretic Peptide: 1342 pg/mL — ABNORMAL HIGH (ref 0.0–100.0)

## 2019-09-26 NOTE — ED Triage Notes (Signed)
Patient states that he has left sided chest pain that started on the way here. Patient states that he was initially coming in for leg swelling. Patient states that he has a history of CHF. Patient states that the swelling has been going on for a long time and that his CHF dr gives him IV lasix but the fluid comes back the next day. Patient states that this is the third time he has been here for the same but the last two times he left without being seen.

## 2019-09-27 ENCOUNTER — Inpatient Hospital Stay
Admission: EM | Admit: 2019-09-27 | Discharge: 2019-09-30 | DRG: 602 | Disposition: A | Discharge status: Home Health | Payer: Medicare Other | Attending: Family Medicine | Admitting: Family Medicine

## 2019-09-27 ENCOUNTER — Observation Stay: Payer: Medicare Other

## 2019-09-27 ENCOUNTER — Encounter: Payer: Self-pay | Admitting: Internal Medicine

## 2019-09-27 DIAGNOSIS — J441 Chronic obstructive pulmonary disease with (acute) exacerbation: Secondary | ICD-10-CM

## 2019-09-27 DIAGNOSIS — I5022 Chronic systolic (congestive) heart failure: Secondary | ICD-10-CM | POA: Diagnosis present

## 2019-09-27 DIAGNOSIS — L0390 Cellulitis, unspecified: Secondary | ICD-10-CM | POA: Diagnosis present

## 2019-09-27 DIAGNOSIS — Z720 Tobacco use: Secondary | ICD-10-CM | POA: Diagnosis present

## 2019-09-27 DIAGNOSIS — I82409 Acute embolism and thrombosis of unspecified deep veins of unspecified lower extremity: Secondary | ICD-10-CM

## 2019-09-27 DIAGNOSIS — I739 Peripheral vascular disease, unspecified: Secondary | ICD-10-CM | POA: Diagnosis present

## 2019-09-27 DIAGNOSIS — E871 Hypo-osmolality and hyponatremia: Secondary | ICD-10-CM | POA: Diagnosis present

## 2019-09-27 DIAGNOSIS — E1169 Type 2 diabetes mellitus with other specified complication: Secondary | ICD-10-CM | POA: Diagnosis not present

## 2019-09-27 DIAGNOSIS — L03116 Cellulitis of left lower limb: Secondary | ICD-10-CM | POA: Diagnosis not present

## 2019-09-27 HISTORY — DX: Cellulitis of left lower limb: L03.116

## 2019-09-27 LAB — GLUCOSE, CAPILLARY
Glucose-Capillary: 97 mg/dL (ref 70–99)
Glucose-Capillary: 113 mg/dL — ABNORMAL HIGH (ref 70–99)
Glucose-Capillary: 114 mg/dL — ABNORMAL HIGH (ref 70–99)
Glucose-Capillary: 136 mg/dL — ABNORMAL HIGH (ref 70–99)

## 2019-09-27 LAB — HEMOGLOBIN A1C
Hgb A1c MFr Bld: 6.6 % — ABNORMAL HIGH (ref 4.8–5.6)
Mean Plasma Glucose: 142.72 mg/dL

## 2019-09-27 LAB — CREATININE, SERUM
Creatinine, Ser: 1 mg/dL (ref 0.61–1.24)
GFR calc Af Amer: >60 mL/min (ref >60)
GFR calc non Af Amer: >60 mL/min (ref >60)

## 2019-09-27 LAB — SARS CORONAVIRUS 2 (TAT 6-24 HRS): SARS Coronavirus 2: NEGATIVE

## 2019-09-27 LAB — HIV ANTIBODY (ROUTINE TESTING W REFLEX): HIV Screen 4th Generation wRfx: NONREACTIVE

## 2019-09-27 LAB — TROPONIN I (HIGH SENSITIVITY): Troponin I (High Sensitivity): 34 ng/L — ABNORMAL HIGH (ref <18)

## 2019-09-27 MED ORDER — CLOPIDOGREL BISULFATE 75 MG PO TABS
75.0000 mg | ORAL_TABLET | Freq: Every day | ORAL | Status: DC
  Administered 2019-09-27 – 2019-09-30 (×4): 75 mg via ORAL
  Filled 2019-09-27 (×4): qty 1

## 2019-09-27 MED ORDER — ONDANSETRON HCL 4 MG PO TABS: 4.0000 mg | ORAL_TABLET | Freq: Four times a day (QID) | ORAL | Status: DC | PRN

## 2019-09-27 MED ORDER — FUROSEMIDE 10 MG/ML IJ SOLN
40.0000 mg | Freq: Two times a day (BID) | INTRAMUSCULAR | Status: DC
  Administered 2019-09-27: 40 mg via INTRAVENOUS
  Filled 2019-09-27: qty 4

## 2019-09-27 MED ORDER — INSULIN ASPART 100 UNIT/ML ~~LOC~~ SOLN
0.0000 [IU] | Freq: Three times a day (TID) | SUBCUTANEOUS | Status: DC
  Administered 2019-09-27 (×2): 0 [IU] via SUBCUTANEOUS
  Administered 2019-09-29 – 2019-09-30 (×2): 2 [IU] via SUBCUTANEOUS
  Filled 2019-09-27 (×3): qty 1

## 2019-09-27 MED ORDER — ACETAMINOPHEN 650 MG RE SUPP: 650.0000 mg | Freq: Four times a day (QID) | RECTAL | Status: DC | PRN

## 2019-09-27 MED ORDER — NICOTINE 21 MG/24HR TD PT24
21.0000 mg | MEDICATED_PATCH | Freq: Every day | TRANSDERMAL | Status: DC
  Administered 2019-09-27 – 2019-09-30 (×4): 21 mg via TRANSDERMAL
  Filled 2019-09-27 (×4): qty 1

## 2019-09-27 MED ORDER — MORPHINE SULFATE (PF) 2 MG/ML IV SOLN
2.0000 mg | INTRAVENOUS | Status: DC | PRN
  Administered 2019-09-27 – 2019-09-30 (×3): 2 mg via INTRAVENOUS
  Filled 2019-09-27 (×3): qty 1

## 2019-09-27 MED ORDER — ATORVASTATIN CALCIUM 20 MG PO TABS
80.0000 mg | ORAL_TABLET | Freq: Every day | ORAL | Status: DC
  Administered 2019-09-27 – 2019-09-30 (×4): 80 mg via ORAL
  Filled 2019-09-27 (×4): qty 4

## 2019-09-27 MED ORDER — ASPIRIN EC 81 MG PO TBEC
81.0000 mg | DELAYED_RELEASE_TABLET | Freq: Every day | ORAL | Status: DC
  Administered 2019-09-27 – 2019-09-30 (×4): 81 mg via ORAL
  Filled 2019-09-27 (×5): qty 1

## 2019-09-27 MED ORDER — TORSEMIDE 20 MG PO TABS
40.0000 mg | ORAL_TABLET | Freq: Two times a day (BID) | ORAL | Status: DC
  Administered 2019-09-27 – 2019-09-30 (×7): 40 mg via ORAL
  Filled 2019-09-27 (×8): qty 2

## 2019-09-27 MED ORDER — INSULIN ASPART 100 UNIT/ML ~~LOC~~ SOLN: 0.0000 [IU] | Freq: Every day | SUBCUTANEOUS | Status: DC

## 2019-09-27 MED ORDER — TRAZODONE HCL 50 MG PO TABS
150.0000 mg | ORAL_TABLET | Freq: Every day | ORAL | Status: DC
  Administered 2019-09-27 – 2019-09-29 (×3): 150 mg via ORAL
  Filled 2019-09-27 (×3): qty 1

## 2019-09-27 MED ORDER — EZETIMIBE 10 MG PO TABS
10.0000 mg | ORAL_TABLET | Freq: Every day | ORAL | Status: DC
  Administered 2019-09-27 – 2019-09-30 (×4): 10 mg via ORAL
  Filled 2019-09-27 (×5): qty 1

## 2019-09-27 MED ORDER — SACUBITRIL-VALSARTAN 24-26 MG PO TABS
1.0000 | ORAL_TABLET | Freq: Two times a day (BID) | ORAL | Status: DC
  Administered 2019-09-27 – 2019-09-30 (×7): 1 via ORAL
  Filled 2019-09-27 (×9): qty 1

## 2019-09-27 MED ORDER — POTASSIUM CHLORIDE CRYS ER 10 MEQ PO TBCR
10.0000 meq | EXTENDED_RELEASE_TABLET | Freq: Every day | ORAL | Status: DC
  Administered 2019-09-27 – 2019-09-30 (×4): 10 meq via ORAL
  Filled 2019-09-27 (×4): qty 1

## 2019-09-27 MED ORDER — GABAPENTIN 400 MG PO CAPS
800.0000 mg | ORAL_CAPSULE | Freq: Four times a day (QID) | ORAL | Status: DC
  Administered 2019-09-27 – 2019-09-30 (×10): 800 mg via ORAL
  Filled 2019-09-27 (×12): qty 2

## 2019-09-27 MED ORDER — CEFAZOLIN SODIUM-DEXTROSE 1-4 GM/50ML-% IV SOLN
1.0000 g | Freq: Three times a day (TID) | INTRAVENOUS | Status: DC
  Administered 2019-09-27 – 2019-09-30 (×9): 1 g via INTRAVENOUS
  Filled 2019-09-27 (×13): qty 50

## 2019-09-27 MED ORDER — ONDANSETRON HCL 4 MG/2ML IJ SOLN: 4.0000 mg | Freq: Four times a day (QID) | INTRAMUSCULAR | Status: DC | PRN

## 2019-09-27 MED ORDER — NITROGLYCERIN 0.4 MG SL SUBL: 0.4000 mg | SUBLINGUAL_TABLET | SUBLINGUAL | Status: DC | PRN

## 2019-09-27 MED ORDER — IPRATROPIUM-ALBUTEROL 0.5-2.5 (3) MG/3ML IN SOLN
3.0000 mL | Freq: Four times a day (QID) | RESPIRATORY_TRACT | Status: DC | PRN
  Administered 2019-09-28 – 2019-09-29 (×3): 3 mL via RESPIRATORY_TRACT
  Filled 2019-09-27 (×4): qty 3

## 2019-09-27 MED ORDER — FUROSEMIDE 10 MG/ML IJ SOLN
80.0000 mg | Freq: Once | INTRAMUSCULAR | Status: AC
  Administered 2019-09-27: 80 mg via INTRAVENOUS
  Filled 2019-09-27: qty 8

## 2019-09-27 MED ORDER — SPIRONOLACTONE 25 MG PO TABS
25.0000 mg | ORAL_TABLET | Freq: Every day | ORAL | Status: DC
  Administered 2019-09-27 – 2019-09-30 (×4): 25 mg via ORAL
  Filled 2019-09-27 (×6): qty 1

## 2019-09-27 MED ORDER — ADULT MULTIVITAMIN W/MINERALS CH
1.0000 | ORAL_TABLET | Freq: Every day | ORAL | Status: DC
  Administered 2019-09-27 – 2019-09-30 (×4): 1 via ORAL
  Filled 2019-09-27 (×3): qty 1

## 2019-09-27 MED ORDER — MOMETASONE FURO-FORMOTEROL FUM 200-5 MCG/ACT IN AERO
2.0000 | INHALATION_SPRAY | Freq: Two times a day (BID) | RESPIRATORY_TRACT | Status: DC
  Administered 2019-09-27 – 2019-09-30 (×6): 2 via RESPIRATORY_TRACT
  Filled 2019-09-27: qty 8.8

## 2019-09-27 MED ORDER — IPRATROPIUM-ALBUTEROL 0.5-2.5 (3) MG/3ML IN SOLN
3.0000 mL | Freq: Once | RESPIRATORY_TRACT | Status: AC
  Administered 2019-09-27: 3 mL via RESPIRATORY_TRACT
  Filled 2019-09-27: qty 3

## 2019-09-27 MED ORDER — OXYCODONE HCL 5 MG PO TABS
10.0000 mg | ORAL_TABLET | Freq: Four times a day (QID) | ORAL | Status: DC | PRN
  Administered 2019-09-27 – 2019-09-30 (×10): 10 mg via ORAL
  Filled 2019-09-27 (×10): qty 2

## 2019-09-27 MED ORDER — METOPROLOL SUCCINATE ER 25 MG PO TB24
25.0000 mg | ORAL_TABLET | Freq: Every day | ORAL | Status: DC
  Administered 2019-09-28 – 2019-09-30 (×3): 25 mg via ORAL
  Filled 2019-09-27 (×4): qty 1

## 2019-09-27 MED ORDER — ENOXAPARIN SODIUM 40 MG/0.4ML ~~LOC~~ SOLN
40.0000 mg | SUBCUTANEOUS | Status: DC
  Administered 2019-09-27 – 2019-09-30 (×4): 40 mg via SUBCUTANEOUS
  Filled 2019-09-27 (×4): qty 0.4

## 2019-09-27 MED ORDER — MULTI-VITAMINS PO TABS: 1.0000 | ORAL_TABLET | Freq: Every day | ORAL | Status: DC

## 2019-09-27 MED ORDER — ACETAMINOPHEN 325 MG PO TABS
650.0000 mg | ORAL_TABLET | Freq: Four times a day (QID) | ORAL | Status: DC | PRN
  Administered 2019-09-29 (×2): 650 mg via ORAL
  Filled 2019-09-27 (×2): qty 2

## 2019-09-27 MED ORDER — MORPHINE SULFATE (PF) 2 MG/ML IV SOLN
2.0000 mg | Freq: Once | INTRAVENOUS | Status: AC
  Administered 2019-09-27: 2 mg via INTRAVENOUS
  Filled 2019-09-27: qty 1

## 2019-09-27 MED ORDER — OXYCODONE HCL 5 MG PO TABS: 5.0000 mg | ORAL_TABLET | ORAL | Status: DC | PRN

## 2019-09-27 MED ORDER — ISOSORBIDE MONONITRATE ER 30 MG PO TB24
30.0000 mg | ORAL_TABLET | Freq: Every day | ORAL | Status: DC
  Administered 2019-09-28 – 2019-09-30 (×3): 30 mg via ORAL
  Filled 2019-09-27 (×5): qty 1

## 2019-09-27 MED ORDER — CALCIUM CARBONATE ANTACID 500 MG PO CHEW: 4.0000 | CHEWABLE_TABLET | Freq: Every day | ORAL | Status: DC | PRN

## 2019-09-27 MED ORDER — SODIUM CHLORIDE 0.9 % IV SOLN
1.0000 g | Freq: Once | INTRAVENOUS | Status: AC
  Administered 2019-09-27: 1 g via INTRAVENOUS
  Filled 2019-09-27: qty 10

## 2019-09-27 MED ORDER — NITROGLYCERIN 0.4 MG/HR TD PT24: 0.4000 mg | MEDICATED_PATCH | Freq: Every day | TRANSDERMAL | Status: DC

## 2019-09-27 NOTE — ED Notes (Signed)
Pt was covid swabbed.

## 2019-09-27 NOTE — H&P (Addendum)
History and Physical    [name] [name] TIR:[id number] DOB: Jul 13, 1960 DOA: 09/27/2019  PCP: [name] Jacobs, MD   Patient coming from: Home  I have personally briefly reviewed patient's old medical records in [place]  Chief Complaint: Chest pain  HPI: [name] [name] is a 59 y.o. male with medical history significant for COPD (ongoing 2 pack a day cigarette use), history of chronic systolic CHF (Last known LVEF of 20 - 25%), ischemic cardiomyopathy, coronary artery disease status post stent angioplasty, PAD who presents to the ER for evaluation of increased left leg pain, redness and swelling which has been going on for a while according to patient but worse in the last couple of days.  He denies having any fever or chills.  He complains of dyspnea which appears to be chronic and unchanged from his baseline.  He denies having any cough, headache, abdominal pain, nausea, vomiting, palpitation or diaphoresis. Patient received IV antibiotics in the emergency room Vital signs showed a pulse ox of 80% on room air when he arrived and this improved following administration of bronchodilator therapy.  Patient also received 1 dose of IV Lasix   ED Course: 59 year old male presented with above-stated history and physical exam consistent with cellulitis of the left lower extremity extending up to the medial thigh.  For which patient was given IV ceftriaxone 1 g.  Patient also with evidence of a COPD exacerbation as such a DuoNeb was administered with improvement of respiratory status.  Patient was given 80 mg of IV Lasix as well.  Laboratory data notable for sodium of 128 however no leukocytosis.  Patient's troponin initially 32 and subsequently 34 BNP improved in comparison to previous from 09/22/2019 when it was 2365  today 1342.  Chest x-ray showed cardiomegaly with no active disease   Review of Systems: As per HPI otherwise 10 point review of systems negative.    Past Medical History:   Diagnosis Date  . Arthritis   . Asthma   . Atherosclerosis   . BPH (benign prostatic hyperplasia)   . Carotid arterial disease ([place])   . Charcot's joint of foot, right   . Chronic combined systolic (congestive) and diastolic (congestive) heart failure ([place])    a. 02/2013 EF 50% by LV gram; b. 04/2017 Echo: EF 25-30%. diff HK. Gr2 DD. Mod dil LA/RV. PASP 68mmHg.  [name] COPD (chronic obstructive pulmonary disease) ([place])   . Coronary artery disease    a. 2013 S/P PCI of LAD [facility]);  b. 03/2013 PCI: RCA 90p ( 2.5 x 23 mm DES); c. 02/2014 Cath: patent RCA stent->Med Rx; d. 04/2017 Cath: LM nl, LAd 30ost/p, 31m/d, D1 80ost, LCX 90p/m, OM1 85, RCA 70/20p, 33m, 70d->Referred for CT Surg-felt to be poor candidate.  . Diabetic neuropathy ([place])   . Diabetic ulcer of right foot ([place])   . Hernia   . Hyperlipidemia   . Hypertension   . Ischemic cardiomyopathy    a. 04/2017 Echo: EF 25-30%; b. 08/2017 Cardiac MRI ([facility]): EF 19%, sev glob HK. RVEF 20%, mod BAE, triv MR, mild-mod TR. Basal lateral subendocardial infarct (viable), inf/infsept, dist septal ischemia, basal to mid lat peri-infarct ischemia.  . Morbid obesity ([place])   . Neuropathy   . PAD (peripheral artery disease) ([place])    a. Followed by Dr. Lucky Cowboy; b. 08/2010 Periph Angio: RSFA 70-80p (6X60 self-expanding stent); c. 09/2010 Periph Angio: L SFA 100p (7x unknown length self-expanding stent); d. 06/2015 Periph Angio: R SFA short segment  occlusion (6x12 self-expanding stent).  . Restless leg syndrome   . Sleep apnea   . Subclavian artery stenosis, right ([place])   . Syncope and collapse   . Tobacco use    a. 75+ yr hx - still smoking 1ppd, down from 2 ppd.  . Type II diabetes mellitus ([place])   . Varicose vein     Past Surgical History:  Procedure Laterality Date  . ABDOMINAL AORTAGRAM N/A 06/23/2013   Procedure: ABDOMINAL Maxcine Ham;  Surgeon: Wellington Hampshire, MD;  Location: [place] CATH LAB;  Service: Cardiovascular;  Laterality: N/A;  . AMPUTATION Right  04/01/2018   Procedure: AMPUTATION BELOW KNEE;  Surgeon: Algernon Huxley, MD;  Location: [facility] ORS;  Service: Vascular;  Laterality: Right;  . APPENDECTOMY    . CARDIAC CATHETERIZATION  10/14   [facility]; X1 STENT PROXIMAL RCA  . CARDIAC CATHETERIZATION  02/2010   [facility]  . CARDIAC CATHETERIZATION  02/21/2014   [facility]  . CLAVICLE SURGERY    . HERNIA REPAIR    . IRRIGATION AND DEBRIDEMENT FOOT Right 01/04/2018   Procedure: IRRIGATION AND DEBRIDEMENT FOOT;  Surgeon: Samara Deist, DPM;  Location: [facility] ORS;  Service: Podiatry;  Laterality: Right;  . LEFT HEART CATH AND CORS/GRAFTS ANGIOGRAPHY N/A 04/28/2017   Procedure: LEFT HEART CATH AND CORONARY ANGIOGRAPHY;  Surgeon: Wellington Hampshire, MD;  Location: [place] CV LAB;  Service: Cardiovascular;  Laterality: N/A;  . LOWER EXTREMITY ANGIOGRAPHY Right 03/03/2018   Procedure: LOWER EXTREMITY ANGIOGRAPHY;  Surgeon: Katha Cabal, MD;  Location: [place] CV LAB;  Service: Cardiovascular;  Laterality: Right;  . LOWER EXTREMITY ANGIOGRAPHY Left 08/24/2018   Procedure: LOWER EXTREMITY ANGIOGRAPHY;  Surgeon: Algernon Huxley, MD;  Location: [place] CV LAB;  Service: Cardiovascular;  Laterality: Left;  . LOWER EXTREMITY ANGIOGRAPHY Left 06/14/2019   Procedure: LOWER EXTREMITY ANGIOGRAPHY;  Surgeon: Algernon Huxley, MD;  Location: [facility] CV LAB;  Service: Cardiovascular;  Laterality: Left;  . LOWER EXTREMITY ANGIOGRAPHY Left 08/02/2019   Procedure: LOWER EXTREMITY ANGIOGRAPHY;  Surgeon: Algernon Huxley, MD;  Location: [place] CV LAB;  Service: Cardiovascular;  Laterality: Left;  . PERIPHERAL ARTERIAL STENT GRAFT     x2 left/right  . PERIPHERAL VASCULAR BALLOON ANGIOPLASTY Right 01/06/2018   Procedure: PERIPHERAL VASCULAR BALLOON ANGIOPLASTY;  Surgeon: Katha Cabal, MD;  Location: [place] CV LAB;  Service: Cardiovascular;  Laterality: Right;  . PERIPHERAL VASCULAR CATHETERIZATION N/A 07/06/2015   Procedure: Abdominal Aortogram w/Lower  Extremity;  Surgeon: Algernon Huxley, MD;  Location: [place] CV LAB;  Service: Cardiovascular;  Laterality: N/A;  . PERIPHERAL VASCULAR CATHETERIZATION  07/06/2015   Procedure: Lower Extremity Intervention;  Surgeon: Algernon Huxley, MD;  Location: [place] CV LAB;  Service: Cardiovascular;;     reports that he has been smoking cigarettes. He has a 41.00 pack-year smoking history. He has never used smokeless tobacco. He reports that he does not drink alcohol or use drugs.  Allergies  Allergen Reactions  . Dulaglutide Anaphylaxis, Diarrhea and Hives  . Other Itching    Skin itching associated with nitro patch  . Prednisone Rash    Family History  Problem Relation Age of Onset  . Heart attack Father   . Heart disease Father   . Hypertension Father   . Hyperlipidemia Father   . Hypertension Mother   . Hyperlipidemia Mother      Prior to Admission medications   Medication Sig Start Date End Date Taking? Authorizing Provider  albuterol (  VENTOLIN HFA) 108 (90 Base) MCG/ACT inhaler Inhale into the lungs every 6 (six) hours as needed for wheezing or shortness of breath.   Yes [provider]  aspirin EC 81 MG tablet Take 1 tablet (81 mg total) by mouth daily. 05/11/15  Yes Wellington Hampshire, MD  atorvastatin (LIPITOR) 80 MG tablet Take 80 mg by mouth daily.   Yes [provider]  calcium carbonate (TUMS - DOSED IN MG ELEMENTAL CALCIUM) 500 MG chewable tablet Chew 4 tablets by mouth daily as needed for indigestion or heartburn.   Yes [provider]  clopidogrel (PLAVIX) 75 MG tablet Take 75 mg by mouth daily.  08/22/17  Yes [provider]  collagenase (SANTYL) ointment Apply 1 application topically daily. 08/11/19  Yes Kris Hartmann, NP  doxycycline (VIBRAMYCIN) 100 MG capsule Take 1 capsule (100 mg total) by mouth 2 (two) times daily. 09/08/19  Yes Kris Hartmann, NP  ENTRESTO 24-26 MG TAKE 1 TABLET BY MOUTH TWICE DAILY 06/19/19  Yes Darylene Price A, FNP   ezetimibe (ZETIA) 10 MG tablet Take 1 tablet (10 mg total) by mouth daily. 05/19/19  Yes Hackney, Otila Kluver A, FNP  gabapentin (NEURONTIN) 800 MG tablet Take 800 mg by mouth 4 (four) times daily.  08/14/17 09/27/19 Yes [provider]  Insulin Glargine (LANTUS SOLOSTAR) 100 UNIT/ML Solostar Pen Inject 30 Units into the skin at bedtime. Patient taking differently: Inject 20-40 Units into the skin 2 (two) times daily. 40 units in the morning and 20 units at night 04/07/18  Yes Mayo, Pete Pelt, MD  insulin lispro (HUMALOG) 100 UNIT/ML injection Sliding Scale Insulin before meals using Novolog If BS is 0 to 70 no insulin and eat/glucose tablets/glucose get If BS 71 to 150 great! If BS 151 to 200, 2 Units Novolog If BS 201 to 250, 4 Units Novolog If BS 251 to 300, 6 Units Novolog If BS 301 to 350, 8 Units Novolog If BS 351 to 400, 10 Units Novolog If BS greater than 400, take 10 Units and Contact our office during office hour 04/14/18  Yes [provider]  ipratropium-albuterol (DUONEB) 0.5-2.5 (3) MG/3ML SOLN Inhale 3 mLs into the lungs every 6 (six) hours as needed (wheezing/sob).    Yes [provider]  isosorbide mononitrate (IMDUR) 30 MG 24 hr tablet Take 1 tablet (30 mg total) by mouth daily. 05/19/19  Yes Hackney, Otila Kluver A, FNP  metFORMIN (GLUCOPHAGE-XR) 500 MG 24 hr tablet Take 1,000 mg by mouth 2 (two) times daily.    Yes [provider]  metoprolol succinate (TOPROL-XL) 25 MG 24 hr tablet Take 1 tablet (25 mg total) by mouth daily. 07/02/19 08/29/20 Yes Loel Dubonnet, NP  Multiple Vitamin (MULTI-VITAMINS) TABS Take 1 tablet by mouth daily.    Yes [provider]  nitroGLYCERIN (NITRODUR - DOSED IN MG/24 HR) 0.4 mg/hr patch Place 0.4 mg onto the skin daily.   Yes [provider]  nitroGLYCERIN (NITROSTAT) 0.4 MG SL tablet Place 1 tablet (0.4 mg total) under the tongue every 5 (five) minutes as needed for chest pain. 05/19/19  Yes Darylene Price A, FNP   Oxycodone HCl 10 MG TABS Take 10 mg by mouth every 6 (six) hours as needed (pain).  01/15/18  Yes [provider]  potassium chloride (KLOR-CON) 10 MEQ tablet Take 1 tablet (10 mEq total) by mouth daily. 05/19/19  Yes Hackney, Tina A, FNP  promethazine (PHENERGAN) 25 MG tablet Take 25 mg by mouth every  6 (six) hours as needed for nausea.  04/29/18  Yes [provider]  spironolactone (ALDACTONE) 25 MG tablet Take 25 mg by mouth daily.  03/02/18  Yes [provider]  sulfamethoxazole-trimethoprim (BACTRIM DS) 800-160 MG tablet Take 1 tablet by mouth 2 (two) times daily. 07/22/19  Yes Kris Hartmann, NP  torsemide (DEMADEX) 20 MG tablet Take 2 tablets (40 mg total) by mouth 2 (two) times daily. 40mg  AM/ 20mg  PM Patient taking differently: Take 40 mg by mouth 2 (two) times daily.  08/31/19  Yes Minna Merritts, MD  traZODone (DESYREL) 150 MG tablet Take 150 mg by mouth at bedtime. 07/12/19  Yes [provider]    Physical Exam: Vitals:   09/27/19 0600 09/27/19 0612 09/27/19 0730 09/27/19 0752  BP: 105/82  111/84   Pulse: (!) 106 99 97   Resp: 12 13 14    Temp:      TempSrc:      SpO2: (!) 80% 94% 96% 97%  Weight:      Height:         Vitals:   09/27/19 0600 09/27/19 0612 09/27/19 0730 09/27/19 0752  BP: 105/82  111/84   Pulse: (!) 106 99 97   Resp: 12 13 14    Temp:      TempSrc:      SpO2: (!) 80% 94% 96% 97%  Weight:      Height:        Constitutional: NAD, alert and oriented x 3 Eyes: PERRL, lids and conjunctivae normal ENMT: Mucous membranes are moist.  Neck: normal, supple, no masses, no thyromegaly Respiratory: Bilateral air entry, scattered wheezing, no crackles. Normal respiratory effort. No accessory muscle use.  Cardiovascular: Regular rate and rhythm, no murmurs / rubs / gallops. No extremity edema. 2+ pedal pulses. No carotid bruits.  Abdomen: no tenderness, no masses palpated. No hepatosplenomegaly. Bowel sounds positive.   Musculoskeletal: no clubbing / cyanosis. Rt BKA Skin: no rashes, lesions, ulcers.  Redness extending from the left leg to the thigh with differential warmth and swelling.  Pressure injury medial portion of the left heel. Neurologic: No gross focal neurologic deficit. Psychiatric: Normal mood and affect.   Labs on Admission: I have personally reviewed following labs and imaging studies  CBC: Recent Labs  Lab 09/22/19 1708 09/26/19 2224  WBC 6.5 6.4  HGB 11.7* 11.4*  HCT 37.1* 36.2*  MCV 72.2* 72.0*  PLT 200 401   Basic Metabolic Panel: Recent Labs  Lab 09/20/19 1220 09/22/19 1708 09/26/19 2224 09/27/19 0823  NA 127* 129* 128*  --   K 4.3 4.1 3.7  --   CL 87* 88* 88*  --   CO2 28 30 29   --   GLUCOSE 109* 75 151*  --   BUN 13 16 15   --   CREATININE 1.27* 1.17 1.15 1.00  CALCIUM 8.6* 9.0 8.6*  --    GFR: Estimated Creatinine Clearance: 91.6 mL/min (by C-G formula based on SCr of 1 mg/dL). Liver Function Tests: No results for input(s): AST, ALT, ALKPHOS, BILITOT, PROT, ALBUMIN in the last 168 hours. No results for input(s): LIPASE, AMYLASE in the last 168 hours. No results for input(s): AMMONIA in the last 168 hours. Coagulation Profile: No results for input(s): INR, PROTIME in the last 168 hours. Cardiac Enzymes: No results for input(s): CKTOTAL, CKMB, CKMBINDEX, TROPONINI in the last 168 hours. BNP (last 3 results) No results for input(s): PROBNP in the last 8760 hours. HbA1C: No results for input(s):  HGBA1C in the last 72 hours. CBG: Recent Labs  Lab 09/27/19 0755  GLUCAP 97   Lipid Profile: No results for input(s): CHOL, HDL, LDLCALC, TRIG, CHOLHDL, LDLDIRECT in the last 72 hours. Thyroid Function Tests: No results for input(s): TSH, T4TOTAL, FREET4, T3FREE, THYROIDAB in the last 72 hours. Anemia Panel: No results for input(s): VITAMINB12, FOLATE, FERRITIN, TIBC, IRON, RETICCTPCT in the last 72 hours. Urine analysis:    Component Value Date/Time    COLORURINE STRAW (A) 09/10/2019 2028   APPEARANCEUR CLEAR (A) 09/10/2019 2028   APPEARANCEUR Clear 02/06/2014 1412   LABSPEC 1.003 (L) 09/10/2019 2028   LABSPEC 1.015 02/06/2014 1412   PHURINE 6.0 09/10/2019 2028   GLUCOSEU NEGATIVE 09/10/2019 2028   GLUCOSEU Negative 02/06/2014 1412   HGBUR NEGATIVE 09/10/2019 2028   BILIRUBINUR NEGATIVE 09/10/2019 2028   BILIRUBINUR Negative 02/06/2014 1412   KETONESUR NEGATIVE 09/10/2019 2028   PROTEINUR NEGATIVE 09/10/2019 2028   NITRITE NEGATIVE 09/10/2019 2028   LEUKOCYTESUR NEGATIVE 09/10/2019 2028   LEUKOCYTESUR Negative 02/06/2014 1412    Radiological Exams on Admission: DG Chest 2 View  Result Date: 09/26/2019 CLINICAL DATA:  Chest pain EXAM: CHEST - 2 VIEW COMPARISON:  09/22/2019 FINDINGS: Cardiomegaly. No overt edema or effusions. No confluent opacities or acute bony abnormality. IMPRESSION: Cardiomegaly.  No active disease. Electronically Signed   By: Rolm Baptise M.D.   On: 09/26/2019 23:00    EKG: Independently reviewed.  Sinus tachycardia with PVCs  Right bundle branch block no  Assessment/Plan Principal Problem:   Cellulitis of left lower leg Active Problems:   PAD (peripheral artery disease) (HCC)   Tobacco use   Chronic systolic CHF (congestive heart failure) (H[facility])   Type 2 diabetes mellitus with other specified complication (H[facility])   Hyponatremia    Left lower extremity cellulitis (POA) Patient presents for evaluation of left leg pain, redness and swelling.   He is afebrile and has no leukocytosis We will treat patient empirically with Ancef 1 g IV every 8 hours Elevate left lower extremity Follow-up results of blood cultures  Wound care consult for pressure injury on left heel   Nicotine dependence Patient smokes 2 packs of cigarettes daily Smoking cessation has been discussed with him in detail We will place patient on nicotine transdermal patch 21 mg daily   Chronic systolic heart failure Last known LVEF of  20 to 25% Maintain low-sodium diet and fluid restriction Continue diuretic therapy with Torsemide Continue Entresto, spironolactone, nitrates and metoprolol   Diabetes mellitus Maintain consistent carbohydrate diet Glycemic control   Coronary artery disease with ischemic cardiomyopathy Continue Plavix, aspirin, nitrates, beta-blockers and statins   Hyponatremia Most likely secondary to volume overload We will place patient on fluid restriction Check daily weight  Peripheral arterial disease Status post right BKA Continue aspirin, Plavix, beta blockers and statins   COPD with acute exacerbation Continue as needed bronchodilator therapy We will add inhaled steroids  DVT prophylaxis: Lovenox Code Status: Full Code Family Communication: Greater than 50% of time was spent discussing plan of care with patient at the bedside.  He verbalizes understanding and agrees with the plan Disposition Plan: Back to previous home environment Consults called: Wound care    Julisa Flippo MD [facility]     09/27/2019, 10:15 AM

## 2019-09-27 NOTE — ED Notes (Signed)
See triage note, pt to ED for increased leg swelling and CP that started on the way to the ED. Edema noted to left lower leg (RLE amputation). Pt reports "missing a few" of his lasix pills.  Reports CP radiated to left arm and to back. Denies N/V.

## 2019-09-27 NOTE — ED Notes (Signed)
This RN to bedside, blood work and Covid swab obtained by this Therapist, sports. Pt tolerated well. Pt placed on the monitor by this RN. VS obtained by this. Pt is noted to be alert and oriented. Pt requesting pain medication at this time.

## 2019-09-27 NOTE — ED Notes (Signed)
Pt given meal tray.

## 2019-09-27 NOTE — ED Notes (Signed)
Report to jeanette, rn.

## 2019-09-27 NOTE — ED Notes (Signed)
Pt asking for pain medication. Order for morphine received from dr. Owens Shark.

## 2019-09-27 NOTE — ED Notes (Signed)
Pt given dentures cup and toothbrush per request. Denture cup filled with water and placed on bedside table as well as toothbrush. Call light within reach. Pt has no further needs at this time.

## 2019-09-27 NOTE — ED Notes (Signed)
Patient assisted to the bathroom 

## 2019-09-27 NOTE — ED Notes (Signed)
Attempted to call report, placed on hold for +10 minutes. Will call again

## 2019-09-27 NOTE — ED Notes (Addendum)
Sent message to MD regarding pt's BP, awaiting orders

## 2019-09-27 NOTE — Consult Note (Signed)
[facility] Nurse Consult Note: Reason for Consult: LE wound Wound type: neuropathic foot ulcer Pressure Injury POA: NA Measurement: see nursing flowsheet Wound bed: pale, dry, non healing Drainage (amount, consistency, odor) no dressing in place to assess Periwound: intact, scaling of the LLE, redness noted with extension into the left inner thigh, consistent with cellulitis; to be treated empirically with Ancel 1gm IV every 8 hours  Dressing procedure/placement/frequency: Silver hydrofiber cut to fit to the wound bed; top with dry dressing. Change daily.   Consider follow up with wound care center of the patient's choice at [place].   Discussed POC with patient and bedside nurse.  Re consult if needed, will not follow at this time. Thanks  Delane Wessinger [employer], RN,CWOCN, CNS, [place] [phone])

## 2019-09-27 NOTE — ED Provider Notes (Signed)
[facility] Emergency Department Provider Note  ____________________________________________   First MD Initiated Contact with Patient 09/27/19 [phone]     (approximate)  I have reviewed the triage vital signs and the nursing notes.   HISTORY  Chief Complaint Chest Pain   HPI [name] [name] is a 59 y.o. male with below list of previous medical conditions including COPD (ongoing 2 pack/day cigarette use) CHF EF 20 to 25% CAD status post PCI presents to the emergency department secondary to increasing left leg swelling pain and redness that the patient states is extending up his leg.  Patient denies any fever afebrile on presentation.  Patient does admit to dyspnea and cough however no fever.  Most recent CHF clinic note revealed a weight of 101.2 kg.  Current weight 99 kg        Past Medical History:  Diagnosis Date  . Arthritis   . Asthma   . Atherosclerosis   . BPH (benign prostatic hyperplasia)   . Carotid arterial disease ([place])   . Charcot's joint of foot, right   . Chronic combined systolic (congestive) and diastolic (congestive) heart failure ([place])    a. 02/2013 EF 50% by LV gram; b. 04/2017 Echo: EF 25-30%. diff HK. Gr2 DD. Mod dil LA/RV. PASP 37mmHg.  [name] COPD (chronic obstructive pulmonary disease) ([place])   . Coronary artery disease    a. 2013 S/P PCI of LAD [facility]);  b. 03/2013 PCI: RCA 90p ( 2.5 x 23 mm DES); c. 02/2014 Cath: patent RCA stent->Med Rx; d. 04/2017 Cath: LM nl, LAd 30ost/p, 31m/d, D1 80ost, LCX 90p/m, OM1 85, RCA 70/20p, 108m, 70d->Referred for CT Surg-felt to be poor candidate.  . Diabetic neuropathy ([place])   . Diabetic ulcer of right foot ([place])   . Hernia   . Hyperlipidemia   . Hypertension   . Ischemic cardiomyopathy    a. 04/2017 Echo: EF 25-30%; b. 08/2017 Cardiac MRI ([facility]): EF 19%, sev glob HK. RVEF 20%, mod BAE, triv MR, mild-mod TR. Basal lateral subendocardial infarct (viable), inf/infsept, dist septal ischemia, basal to mid lat  peri-infarct ischemia.  . Morbid obesity ([place])   . Neuropathy   . PAD (peripheral artery disease) ([place])    a. Followed by Dr. Lucky Cowboy; b. 08/2010 Periph Angio: RSFA 70-80p (6X60 self-expanding stent); c. 09/2010 Periph Angio: L SFA 100p (7x unknown length self-expanding stent); d. 06/2015 Periph Angio: R SFA short segment occlusion (6x12 self-expanding stent).  . Restless leg syndrome   . Sleep apnea   . Subclavian artery stenosis, right ([place])   . Syncope and collapse   . Tobacco use    a. 75+ yr hx - still smoking 1ppd, down from 2 ppd.  . Type II diabetes mellitus ([place])   . Varicose vein     Patient Active Problem List   Diagnosis Date Noted  . Cellulitis of left leg 08/31/2019  . CAD (coronary artery disease) 08/31/2019  . Acute on chronic systolic CHF (congestive heart failure) ([place]) 11/05/2018  . Open toe wound 10/01/2018  . Venous ulcer of left leg ([place]) 08/19/2018  . 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[place]) 04/28/2018  . Sepsis ([place]) 03/30/2018  . Abnormal MRI, shoulder (Right) 02/23/2018  . Abnormal MRI, cervical spine (2016) 02/23/2018  . DDD (degenerative disc disease), cervical 02/23/2018  . Cervical foraminal stenosis (Bilateral) 02/23/2018  . Cervical central spinal stenosis 02/23/2018  . Cervical facet hypertrophy 02/23/2018  . Arthralgia of acromioclavicular joint (Right) 02/23/2018  . Osteoarthritis  of  AC (acromioclavicular) joint (Right) 02/23/2018  . Biceps tendinosis of shoulder (Right) 02/23/2018  . Tendinopathy of rotator cuff (Right) 02/23/2018  . Vitamin D deficiency 02/23/2018  . Atherosclerosis of native arteries of the extremities with ulceration ([place]) 02/15/2018  . Peripheral edema 02/05/2018  . Status post peripherally inserted central catheter (PICC) central line placement 02/04/2018  . Chronic lower extremity pain (Primary Area of Pain) (Right) 01/29/2018  . Chronic shoulder pain [facility] Area of Pain) (Right) 01/29/2018  . Chronic foot  pain (Secondary Area of Pain) (Right) 01/29/2018  . Chronic pain syndrome 01/29/2018  . Long term current use of opiate analgesic 01/29/2018  . Medication monitoring encounter 01/29/2018  . Disorder of skeletal system 01/29/2018  . Problems influencing health status 01/29/2018  . AKI (acute kidney injury) ([place]) 01/03/2018  . NSTEMI (non-ST elevated myocardial infarction) ([place]) 11/21/2017  . Coronary artery disease of native artery of native heart with stable angina pectoris ([place]) 09/10/2017  . HLD (hyperlipidemia) 09/10/2017  . Angina pectoris ([place]) 09/08/2017  . Angina at rest [facility]) 08/13/2017  . Lymphedema 05/02/2017  . Chronic systolic HF (heart failure) ([place]) 04/23/2017  . Chest pain 07/11/2016  . Unstable angina ([place]) 07/11/2016  . Foot ulcer (Right)   . Stable angina (HCC)   . Pressure injury of skin 05/06/2016  . Diabetic foot infection ([place]) 05/05/2016  . Type 2 diabetes mellitus with complication, with long-term current use of insulin ([place]) 01/26/2016  . Cellulitis 11/21/2015  . Morbid obesity ([place]) 11/17/2014  . PAD (peripheral artery disease) ([place])   . Hypertension   . Tobacco use   . COPD (chronic obstructive pulmonary disease) ([place]) 05/19/2012  . Diabetic neuropathy ([place]) 05/19/2012    Past Surgical History:  Procedure Laterality Date  . ABDOMINAL AORTAGRAM N/A 06/23/2013   Procedure: ABDOMINAL Maxcine Ham;  Surgeon: Wellington Hampshire, MD;  Location: [place] CATH LAB;  Service: Cardiovascular;  Laterality: N/A;  . AMPUTATION Right 04/01/2018   Procedure: AMPUTATION BELOW KNEE;  Surgeon: Algernon Huxley, MD;  Location: [facility] ORS;  Service: Vascular;  Laterality: Right;  . APPENDECTOMY    . CARDIAC CATHETERIZATION  10/14   [facility]; X1 STENT PROXIMAL RCA  . CARDIAC CATHETERIZATION  02/2010   [facility]  . CARDIAC CATHETERIZATION  02/21/2014   [facility]  . CLAVICLE SURGERY    . HERNIA REPAIR    . IRRIGATION AND DEBRIDEMENT FOOT Right 01/04/2018   Procedure: IRRIGATION AND DEBRIDEMENT FOOT;   Surgeon: Samara Deist, DPM;  Location: [facility] ORS;  Service: Podiatry;  Laterality: Right;  . LEFT HEART CATH AND CORS/GRAFTS ANGIOGRAPHY N/A 04/28/2017   Procedure: LEFT HEART CATH AND CORONARY ANGIOGRAPHY;  Surgeon: Wellington Hampshire, MD;  Location: [place] CV LAB;  Service: Cardiovascular;  Laterality: N/A;  . LOWER EXTREMITY ANGIOGRAPHY Right 03/03/2018   Procedure: LOWER EXTREMITY ANGIOGRAPHY;  Surgeon: Katha Cabal, MD;  Location: [place] CV LAB;  Service: Cardiovascular;  Laterality: Right;  . LOWER EXTREMITY ANGIOGRAPHY Left 08/24/2018   Procedure: LOWER EXTREMITY ANGIOGRAPHY;  Surgeon: Algernon Huxley, MD;  Location: [place] CV LAB;  Service: Cardiovascular;  Laterality: Left;  . LOWER EXTREMITY ANGIOGRAPHY Left 06/14/2019   Procedure: LOWER EXTREMITY ANGIOGRAPHY;  Surgeon: Algernon Huxley, MD;  Location: [place] CV LAB;  Service: Cardiovascular;  Laterality: Left;  . LOWER EXTREMITY ANGIOGRAPHY Left 08/02/2019   Procedure: LOWER EXTREMITY ANGIOGRAPHY;  Surgeon: Algernon Huxley, MD;  Location: [place] CV LAB;  Service: Cardiovascular;  Laterality: Left;  . PERIPHERAL ARTERIAL  STENT GRAFT     x2 left/right  . PERIPHERAL VASCULAR BALLOON ANGIOPLASTY Right 01/06/2018   Procedure: PERIPHERAL VASCULAR BALLOON ANGIOPLASTY;  Surgeon: Katha Cabal, MD;  Location: [place] CV LAB;  Service: Cardiovascular;  Laterality: Right;  . PERIPHERAL VASCULAR CATHETERIZATION N/A 07/06/2015   Procedure: Abdominal Aortogram w/Lower Extremity;  Surgeon: Algernon Huxley, MD;  Location: [place] CV LAB;  Service: Cardiovascular;  Laterality: N/A;  . PERIPHERAL VASCULAR CATHETERIZATION  07/06/2015   Procedure: Lower Extremity Intervention;  Surgeon: Algernon Huxley, MD;  Location: [place] CV LAB;  Service: Cardiovascular;;    Prior to Admission medications   Medication Sig Start Date End Date Taking? Authorizing Provider  albuterol (VENTOLIN HFA) 108 (90 Base) MCG/ACT inhaler  Inhale into the lungs every 6 (six) hours as needed for wheezing or shortness of breath.    [provider]  aspirin EC 81 MG tablet Take 1 tablet (81 mg total) by mouth daily. 05/11/15   Wellington Hampshire, MD  atorvastatin (LIPITOR) 80 MG tablet Take 80 mg by mouth daily.    [provider]  calcium carbonate (TUMS - DOSED IN MG ELEMENTAL CALCIUM) 500 MG chewable tablet Chew 4 tablets by mouth daily as needed for indigestion or heartburn.    [provider]  clopidogrel (PLAVIX) 75 MG tablet Take 75 mg by mouth daily.  08/22/17   [provider]  collagenase (SANTYL) ointment Apply 1 application topically daily. 08/11/19   Kris Hartmann, NP  doxycycline (VIBRAMYCIN) 100 MG capsule Take 1 capsule (100 mg total) by mouth 2 (two) times daily. 09/08/19   Kris Hartmann, NP  ENTRESTO 24-26 MG TAKE 1 TABLET BY MOUTH TWICE DAILY 06/19/19   Alisa Graff, FNP  ezetimibe (ZETIA) 10 MG tablet Take 1 tablet (10 mg total) by mouth daily. 05/19/19   Alisa Graff, FNP  gabapentin (NEURONTIN) 800 MG tablet Take 800 mg by mouth 4 (four) times daily.  08/14/17 09/22/19  [provider]  Insulin Glargine (LANTUS SOLOSTAR) 100 UNIT/ML Solostar Pen Inject 30 Units into the skin at bedtime. Patient taking differently: Inject 20-40 Units into the skin 2 (two) times daily. 40 units in the morning and 20 units at night 04/07/18   Mayo, Pete Pelt, MD  insulin lispro (HUMALOG) 100 UNIT/ML injection Sliding Scale Insulin before meals using Novolog If BS is 0 to 70 no insulin and eat/glucose tablets/glucose get If BS 71 to 150 great! If BS 151 to 200, 2 Units Novolog If BS 201 to 250, 4 Units Novolog If BS 251 to 300, 6 Units Novolog If BS 301 to 350, 8 Units Novolog If BS 351 to 400, 10 Units Novolog If BS greater than 400, take 10 Units and Contact our office during office hour 04/14/18   [provider]  ipratropium-albuterol (DUONEB) 0.5-2.5 (3) MG/3ML SOLN Inhale 3 mLs into  the lungs every 6 (six) hours as needed (wheezing/sob).     [provider]  isosorbide mononitrate (IMDUR) 30 MG 24 hr tablet Take 1 tablet (30 mg total) by mouth daily. 05/19/19   Alisa Graff, FNP  metFORMIN (GLUCOPHAGE-XR) 500 MG 24 hr tablet Take 1,000 mg by mouth 2 (two) times daily.     [provider]  metoprolol succinate (TOPROL-XL) 25 MG 24 hr tablet Take 1 tablet (25 mg total) by mouth daily. 07/02/19 08/29/20  Loel Dubonnet, NP  Multiple Vitamin (MULTI-VITAMINS) TABS Take 1 tablet by mouth daily.  [provider]  nitroGLYCERIN (NITRODUR - DOSED IN MG/24 HR) 0.4 mg/hr patch Place 0.4 mg onto the skin daily.    [provider]  nitroGLYCERIN (NITROSTAT) 0.4 MG SL tablet Place 1 tablet (0.4 mg total) under the tongue every 5 (five) minutes as needed for chest pain. 05/19/19   Alisa Graff, FNP  Oxycodone HCl 10 MG TABS Take 10 mg by mouth every 6 (six) hours as needed (pain).  01/15/18   [provider]  potassium chloride (KLOR-CON) 10 MEQ tablet Take 1 tablet (10 mEq total) by mouth daily. 05/19/19   Alisa Graff, FNP  promethazine (PHENERGAN) 25 MG tablet TAKE 1 TABLET BY MOUTH EVERY 6 HOURS AS NEEDED NAUSEA 04/29/18   [provider]  spironolactone (ALDACTONE) 25 MG tablet Take 25 mg by mouth daily.  03/02/18   [provider]  sulfamethoxazole-trimethoprim (BACTRIM DS) 800-160 MG tablet Take 1 tablet by mouth 2 (two) times daily. 07/22/19   Kris Hartmann, NP  torsemide (DEMADEX) 20 MG tablet Take 2 tablets (40 mg total) by mouth 2 (two) times daily. 40mg  AM/ 20mg  PM Patient taking differently: Take 40 mg by mouth 2 (two) times daily.  08/31/19   Minna Merritts, MD  traZODone (DESYREL) 150 MG tablet Take 150 mg by mouth at bedtime. 07/12/19   [provider]    Allergies Dulaglutide, Other, and Prednisone  Family History  Problem Relation Age of Onset  . Heart attack Father   . Heart disease Father    . Hypertension Father   . Hyperlipidemia Father   . Hypertension Mother   . Hyperlipidemia Mother     Social History Social History   Tobacco Use  . Smoking status: Current Every Day Smoker    Packs/day: 1.00    Years: 41.00    Pack years: 41.00    Types: Cigarettes  . Smokeless tobacco: Never Used  Substance Use Topics  . Alcohol use: No  . Drug use: No    Review of Systems Constitutional: No fever/chills Eyes: No visual changes. ENT: No sore throat. Cardiovascular: Denies chest pain. Respiratory: Denies shortness of breath. Gastrointestinal: No abdominal pain.  No nausea, no vomiting.  No diarrhea.  No constipation. Genitourinary: Negative for dysuria. Musculoskeletal: Positive for left lower extremity redness pain and swelling. Integumentary: Negative for rash. Neurological: Negative for headaches, focal weakness or numbness.   ____________________________________________   PHYSICAL EXAM:  VITAL SIGNS: ED Triage Vitals [09/26/19 2221]  Enc Vitals Group     BP 114/75     Pulse Rate (!) 103     Resp (!) 22     Temp 98.3 F (36.8 C)     Temp Source Oral     SpO2 99 %     Weight 101.6 kg (224 lb)     Height 1.702 m (5\' 7" )     Head Circumference      Peak Flow      Pain Score 9     Pain Loc      Pain Edu?      Excl. in [place]?    Constitutional: Alert and oriented.  Eyes: Conjunctivae are normal.  Mouth/Throat: Patient is wearing a mask. Neck: No stridor.  No meningeal signs.   Cardiovascular: Normal rate, regular rhythm. Good peripheral circulation. Grossly normal heart sounds. Respiratory: Normal respiratory effort.  No retractions. Gastrointestinal: Soft and nontender. No distention.  Musculoskeletal: No lower extremity tenderness nor edema. No gross deformities of extremities. Neurologic:  Normal speech and language. No gross focal neurologic deficits are appreciated.  Skin: Blanching erythema left lower leg expecting to the medial left thigh.  Hot  to touch.  Venous stasis skin changes as well noted.      Psychiatric: Mood and affect are normal. Speech and behavior are normal.  ____________________________________________   LABS (all labs ordered are listed, but only abnormal results are displayed)  Labs Reviewed  BASIC METABOLIC PANEL - Abnormal; Notable for the following components:      Result Value   Sodium 128 (*)    Chloride 88 (*)    Glucose, Bld 151 (*)    Calcium 8.6 (*)    All other components within normal limits  CBC - Abnormal; Notable for the following components:   Hemoglobin 11.4 (*)    HCT 36.2 (*)    MCV 72.0 (*)    MCH 22.7 (*)    RDW 23.5 (*)    All other components within normal limits  BRAIN NATRIURETIC PEPTIDE - Abnormal; Notable for the following components:   B Natriuretic Peptide 1,342.0 (*)    All other components within normal limits  TROPONIN I (HIGH SENSITIVITY) - Abnormal; Notable for the following components:   Troponin I (High Sensitivity) 32 (*)    All other components within normal limits  TROPONIN I (HIGH SENSITIVITY) - Abnormal; Notable for the following components:   Troponin I (High Sensitivity) 34 (*)    All other components within normal limits   ____________________________________________  EKG  ED ECG REPORT I, [place] Alvetta Hidrogo, the attending physician, personally viewed and interpreted this ECG.   Date: 09/26/2019  EKG Time: 10:15 PM  Rate: 105  Rhythm: Sinus tachycardia  Axis: Normal  Intervals: Normal  ST&T Change: None  ____________________________________________  RADIOLOGY I, [place] Tyja Gortney, personally viewed and evaluated these images (plain radiographs) as part of my medical decision making, as well as reviewing the written report by the radiologist.  ED MD interpretation: Cardiomegaly no active disease noted on chest x-ray per radiology  Official radiology report(s): DG Chest 2 View  Result Date: 09/26/2019 CLINICAL DATA:  Chest pain EXAM: CHEST  - 2 VIEW COMPARISON:  09/22/2019 FINDINGS: Cardiomegaly. No overt edema or effusions. No confluent opacities or acute bony abnormality. IMPRESSION: Cardiomegaly.  No active disease. Electronically Signed   By: Rolm Baptise M.D.   On: 09/26/2019 23:00      Procedures   ____________________________________________   INITIAL IMPRESSION / MDM / ASSESSMENT AND PLAN / ED COURSE  As part of my medical decision making, I reviewed the following data within the [place]  59 year old male presented with above-stated history and physical exam consistent with cellulitis of the left lower extremity extending up to the medial thigh.  For which patient was given IV ceftriaxone 1 g.  Patient also with evidence of a COPD exacerbation as such a DuoNeb was administered with improvement of respiratory status.  Patient was given 80 mg of IV Lasix as well.  Laboratory data notable for sodium of 128 however no leukocytosis.  Patient's troponin initially 32 and subsequently 34 BNP improve in comparison to previous from 09/22/2019 when it was 2003 and 65 today 1342.  Patient discussed with Dr. Damita Dunnings for hospital admission for further evaluation and management of left lower extremity cellulitis ____________________________________________  FINAL CLINICAL IMPRESSION(S) / ED DIAGNOSES  Final diagnoses:  Left leg cellulitis  COPD exacerbation ([place])     MEDICATIONS GIVEN DURING THIS VISIT:  Medications  cefTRIAXone (ROCEPHIN) 1 g in sodium chloride 0.9 % 100 mL IVPB (1 g Intravenous New Bag/Given 09/27/19 0325)  furosemide (LASIX) injection 80 mg (80 mg Intravenous Given 09/27/19 0307)  ipratropium-albuterol (DUONEB) 0.5-2.5 (3) MG/3ML nebulizer solution 3 mL (3 mLs Nebulization Given 09/27/19 0347)     ED Discharge Orders    None      *Please note:  VIDUR KNUST was evaluated in Emergency Department on 09/27/2019 for the symptoms described in the history of present illness. He was evaluated  in the context of the global COVID-19 pandemic, which necessitated consideration that the patient might be at risk for infection with the SARS-CoV-2 virus that causes COVID-19. Institutional protocols and algorithms that pertain to the evaluation of patients at risk for COVID-19 are in a state of rapid change based on information released by regulatory bodies including the CDC and federal and state organizations. These policies and algorithms were followed during the patient's care in the ED.  Some ED evaluations and interventions may be delayed as a result of limited staffing during the pandemic.*  Note:  This document was prepared using Dragon voice recognition software and may include unintentional dictation errors.   Gregor Hams, MD 09/27/19 [id number]

## 2019-09-28 ENCOUNTER — Observation Stay (HOSPITAL_COMMUNITY)
Admit: 2019-09-28 | Discharge: 2019-09-28 | Disposition: A | Discharge status: Home / Self Care | Payer: Medicare Other | Attending: Internal Medicine | Admitting: Internal Medicine

## 2019-09-28 ENCOUNTER — Observation Stay: Admit: 2019-09-28 | Payer: Medicare Other

## 2019-09-28 DIAGNOSIS — L03116 Cellulitis of left lower limb: Secondary | ICD-10-CM | POA: Diagnosis present

## 2019-09-28 DIAGNOSIS — D649 Anemia, unspecified: Secondary | ICD-10-CM | POA: Diagnosis present

## 2019-09-28 DIAGNOSIS — I255 Ischemic cardiomyopathy: Secondary | ICD-10-CM | POA: Diagnosis present

## 2019-09-28 DIAGNOSIS — I739 Peripheral vascular disease, unspecified: Secondary | ICD-10-CM | POA: Diagnosis not present

## 2019-09-28 DIAGNOSIS — I252 Old myocardial infarction: Secondary | ICD-10-CM | POA: Diagnosis not present

## 2019-09-28 DIAGNOSIS — R079 Chest pain, unspecified: Secondary | ICD-10-CM | POA: Diagnosis present

## 2019-09-28 DIAGNOSIS — E662 Morbid (severe) obesity with alveolar hypoventilation: Secondary | ICD-10-CM | POA: Diagnosis present

## 2019-09-28 DIAGNOSIS — I872 Venous insufficiency (chronic) (peripheral): Secondary | ICD-10-CM | POA: Diagnosis present

## 2019-09-28 DIAGNOSIS — E871 Hypo-osmolality and hyponatremia: Secondary | ICD-10-CM | POA: Diagnosis present

## 2019-09-28 DIAGNOSIS — I081 Rheumatic disorders of both mitral and tricuspid valves: Secondary | ICD-10-CM | POA: Diagnosis present

## 2019-09-28 DIAGNOSIS — I6529 Occlusion and stenosis of unspecified carotid artery: Secondary | ICD-10-CM | POA: Diagnosis present

## 2019-09-28 DIAGNOSIS — E1122 Type 2 diabetes mellitus with diabetic chronic kidney disease: Secondary | ICD-10-CM | POA: Diagnosis present

## 2019-09-28 DIAGNOSIS — I472 Ventricular tachycardia: Secondary | ICD-10-CM | POA: Diagnosis not present

## 2019-09-28 DIAGNOSIS — Z20822 Contact with and (suspected) exposure to covid-19: Secondary | ICD-10-CM | POA: Diagnosis present

## 2019-09-28 DIAGNOSIS — E785 Hyperlipidemia, unspecified: Secondary | ICD-10-CM | POA: Diagnosis present

## 2019-09-28 DIAGNOSIS — E1140 Type 2 diabetes mellitus with diabetic neuropathy, unspecified: Secondary | ICD-10-CM | POA: Diagnosis present

## 2019-09-28 DIAGNOSIS — J441 Chronic obstructive pulmonary disease with (acute) exacerbation: Secondary | ICD-10-CM | POA: Diagnosis present

## 2019-09-28 DIAGNOSIS — I2729 Other secondary pulmonary hypertension: Secondary | ICD-10-CM | POA: Diagnosis present

## 2019-09-28 DIAGNOSIS — E11621 Type 2 diabetes mellitus with foot ulcer: Secondary | ICD-10-CM | POA: Diagnosis present

## 2019-09-28 DIAGNOSIS — E1151 Type 2 diabetes mellitus with diabetic peripheral angiopathy without gangrene: Secondary | ICD-10-CM | POA: Diagnosis present

## 2019-09-28 DIAGNOSIS — I5022 Chronic systolic (congestive) heart failure: Secondary | ICD-10-CM | POA: Diagnosis not present

## 2019-09-28 DIAGNOSIS — I4510 Unspecified right bundle-branch block: Secondary | ICD-10-CM | POA: Diagnosis present

## 2019-09-28 DIAGNOSIS — L97429 Non-pressure chronic ulcer of left heel and midfoot with unspecified severity: Secondary | ICD-10-CM | POA: Diagnosis present

## 2019-09-28 DIAGNOSIS — I5031 Acute diastolic (congestive) heart failure: Secondary | ICD-10-CM

## 2019-09-28 DIAGNOSIS — N183 Chronic kidney disease, stage 3 unspecified: Secondary | ICD-10-CM | POA: Diagnosis present

## 2019-09-28 DIAGNOSIS — I130 Hypertensive heart and chronic kidney disease with heart failure and stage 1 through stage 4 chronic kidney disease, or unspecified chronic kidney disease: Secondary | ICD-10-CM | POA: Diagnosis present

## 2019-09-28 DIAGNOSIS — I2510 Atherosclerotic heart disease of native coronary artery without angina pectoris: Secondary | ICD-10-CM | POA: Diagnosis present

## 2019-09-28 DIAGNOSIS — I5043 Acute on chronic combined systolic (congestive) and diastolic (congestive) heart failure: Secondary | ICD-10-CM | POA: Diagnosis present

## 2019-09-28 LAB — BASIC METABOLIC PANEL
Anion gap: 8 (ref 5–15)
BUN: 13 mg/dL (ref 6–20)
CO2: 34 mmol/L — ABNORMAL HIGH (ref 22–32)
Calcium: 8.9 mg/dL (ref 8.9–10.3)
Chloride: 95 mmol/L — ABNORMAL LOW (ref 98–111)
Creatinine, Ser: 1.04 mg/dL (ref 0.61–1.24)
GFR calc Af Amer: >60 mL/min (ref >60)
GFR calc non Af Amer: >60 mL/min (ref >60)
Glucose, Bld: 106 mg/dL — ABNORMAL HIGH (ref 70–99)
Potassium: 4.2 mmol/L (ref 3.5–5.1)
Sodium: 137 mmol/L (ref 135–145)

## 2019-09-28 LAB — ECHOCARDIOGRAM COMPLETE: Weight: 3512 oz

## 2019-09-28 LAB — GLUCOSE, CAPILLARY
Glucose-Capillary: 80 mg/dL (ref 70–99)
Glucose-Capillary: 109 mg/dL — ABNORMAL HIGH (ref 70–99)
Glucose-Capillary: 98 mg/dL (ref 70–99)
Glucose-Capillary: 166 mg/dL — ABNORMAL HIGH (ref 70–99)

## 2019-09-28 LAB — MAGNESIUM: Magnesium: 1.7 mg/dL (ref 1.7–2.4)

## 2019-09-28 NOTE — TOC Initial Note (Signed)
Transition of Care [facility]) - Initial/Assessment Note    Patient Details  Name: [name] [name] MRN: [id number] Date of Birth: Jun 07, 1961  Transition of Care [facility]) CM/SW Contact:    Elease Hashimoto, LCSW Phone Number: 09/28/2019, 9:03 AM  Clinical Narrative:   Met with pt who is a high rick screening-lives with girlfriend of 21 years. Both are disabled and receive SSD checks. Both are independent and take care of themselves. Both drive and pt has a PCP and is followed by HF clinic. [facility] is currently following and providing [place] services. Will have them resume due to pt likes them. Has all DME needs from previous admissions. Continue to follow and work on discharge needs.                Expected Discharge Plan: [place] Barriers to Discharge: Continued Medical Work up   Patient Goals and CMS Choice Patient states their goals for this hospitalization and ongoing recovery are:: I hope to save my foot, it is my only leg left      Expected Discharge Plan and Services Expected Discharge Plan: [place] In-house Referral: Clinical Social Work   Post Acute Care Choice: Home Health                                        Prior Living Arrangements/Services   Lives with:: Significant Other Patient language and need for interpreter reviewed:: No Do you feel safe going back to the place where you live?: Yes      Need for Family Participation in Patient Care: Yes (Comment) Care giver support system in place?: Yes (comment) Current home services: DME, Home PT, Home RN(Has wc, rw, bsc and [facility] was following PTA) Criminal Activity/Legal Involvement Pertinent to Current Situation/Hospitalization: No - Comment as needed  Activities of Daily Living Home Assistive Devices/Equipment: Wheelchair ADL Screening (condition at time of admission) Patient's cognitive ability adequate to safely complete daily activities?: Yes Is the patient deaf or  have difficulty hearing?: No Does the patient have difficulty seeing, even when wearing glasses/contacts?: No Does the patient have difficulty concentrating, remembering, or making decisions?: No Patient able to express need for assistance with ADLs?: Yes Does the patient have difficulty dressing or bathing?: No Independently performs ADLs?: Yes (appropriate for developmental age) Does the patient have difficulty walking or climbing stairs?: Yes Weakness of Legs: None Weakness of Arms/Hands: None  Permission Sought/Granted Permission sought to share information with : Family Supports, Chartered certified accountant granted to share information with : Yes, Verbal Permission Granted  Share Information with NAME: [name]  Permission granted to share info w AGENCY: [employer]  Permission granted to share info w Relationship: Girlfriend  Permission granted to share info w Sport and exercise psychologist Information: [place]  Emotional Assessment Appearance:: Appears stated age Attitude/Demeanor/Rapport: Engaged Affect (typically observed): Adaptable, Accepting Orientation: : Oriented to Self, Oriented to Place, Oriented to  Time, Oriented to Situation Alcohol / Substance Use: Tobacco Use(2 packs a day) Psych Involvement: No (comment)  Admission diagnosis:  DVT (deep venous thrombosis) (HCC) [I82.409] COPD exacerbation (HCC) [J44.1] Cellulitis of foot, left [L03.116] Left leg cellulitis [L03.116] Patient Active Problem List   Diagnosis Date Noted  . Cellulitis of left lower leg 09/27/2019  . Cellulitis of foot, left 09/27/2019  . Hyponatremia 09/27/2019  . Cellulitis of left leg 08/31/2019  . CAD (coronary artery  disease) 08/31/2019  . Acute on chronic systolic CHF (congestive heart failure) ([place]) 11/05/2018  . Open toe wound 10/01/2018  . Venous ulcer of left leg ([place]) 08/19/2018  . 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[place]) 04/28/2018  . Sepsis ([place]) 03/30/2018  . Abnormal MRI,  shoulder (Right) 02/23/2018  . Abnormal MRI, cervical spine (2016) 02/23/2018  . DDD (degenerative disc disease), cervical 02/23/2018  . Cervical foraminal stenosis (Bilateral) 02/23/2018  . Cervical central spinal stenosis 02/23/2018  . Cervical facet hypertrophy 02/23/2018  . Arthralgia of acromioclavicular joint (Right) 02/23/2018  . Osteoarthritis of  AC (acromioclavicular) joint (Right) 02/23/2018  . Biceps tendinosis of shoulder (Right) 02/23/2018  . Tendinopathy of rotator cuff (Right) 02/23/2018  . Vitamin D deficiency 02/23/2018  . Atherosclerosis of native arteries of the extremities with ulceration ([place]) 02/15/2018  . Peripheral edema 02/05/2018  . Status post peripherally inserted central catheter (PICC) central line placement 02/04/2018  . Chronic lower extremity pain (Primary Area of Pain) (Right) 01/29/2018  . Chronic shoulder pain [facility] Area of Pain) (Right) 01/29/2018  . Chronic foot pain (Secondary Area of Pain) (Right) 01/29/2018  . Chronic pain syndrome 01/29/2018  . Long term current use of opiate analgesic 01/29/2018  . Medication monitoring encounter 01/29/2018  . Disorder of skeletal system 01/29/2018  . Problems influencing health status 01/29/2018  . AKI (acute kidney injury) ([place]) 01/03/2018  . NSTEMI (non-ST elevated myocardial infarction) ([place]) 11/21/2017  . Coronary artery disease of native artery of native heart with stable angina pectoris ([place]) 09/10/2017  . HLD (hyperlipidemia) 09/10/2017  . Angina pectoris ([place]) 09/08/2017  . Angina at rest [facility]) 08/13/2017  . Lymphedema 05/02/2017  . Chronic systolic CHF (congestive heart failure) ([place]) 04/23/2017  . Chest pain 07/11/2016  . Unstable angina ([place]) 07/11/2016  . Foot ulcer (Right)   . Stable angina (HCC)   . Pressure injury of skin 05/06/2016  . Diabetic foot infection ([place]) 05/05/2016  . Type 2 diabetes mellitus with other specified complication ([place]) 67/61/9509  . Cellulitis 11/21/2015  .  Morbid obesity ([place]) 11/17/2014  . PAD (peripheral artery disease) ([place])   . Hypertension   . Tobacco use   . COPD (chronic obstructive pulmonary disease) ([place]) 05/19/2012  . Diabetic neuropathy ([place]) 05/19/2012   PCP:  [name], MD Pharmacy:   [place], [place]. [place] [address] Phone: [phone] Fax: [facility] [address] [place] [address] Phone: [phone] Fax: [phone]     Social Determinants of Health (SDOH) Interventions    Readmission Risk Interventions Readmission Risk Prevention Plan 11/09/2018 11/08/2018  Transportation Screening - Complete  HRI or [place] - Complete  Social Work Consult for [place] Planning/Counseling Not Complete -  SW consult not completed comments na -  Palliative Care Screening Not Applicable -  Medication Review (RN Care Manager) - Complete  Some recent data might be hidden

## 2019-09-28 NOTE — Plan of Care (Signed)

## 2019-09-28 NOTE — Progress Notes (Signed)
PROGRESS NOTE    [name] [name]  VOH:[id number] DOB: 25-Oct-1960 DOA: 09/27/2019 PCP: [name], MD    Brief Narrative:  59 year old male with a history of chronic systolic congestive heart failure, ejection fraction of 20 to 25%, COPD, diabetes, coronary artery disease, presents to the hospital with increasing left leg pain, redness and swelling.  Found to have cellulitis of left lower extremity admitted for IV antibiotics.  He also had some evidence of volume overload and received a dose of intravenous Lasix.  He is continued on his home dose of torsemide now.   Assessment & Plan:   Principal Problem:   Cellulitis of left lower leg Active Problems:   PAD (peripheral artery disease) (HCC)   Tobacco use   Cellulitis   Chronic systolic CHF (congestive heart failure) (HCC)   Type 2 diabetes mellitus with other specified complication (HCC)   Hyponatremia   1. Left lower extremity cellulitis.  Patient has continued erythema streaking from left lower leg up onto his left leg.  We will continue intravenous antibiotics for another 24 hours.  Venous Dopplers negative for DVT 2. Acute on chronic systolic congestive heart failure.  Ejection fraction of 20 to 25%.  Patient did receive a dose of intravenous Lasix yesterday.  He is continued on torsemide at this time.  He has had good diuresis yesterday.  Continue with oral torsemide.  He is also on Entresto, spironolactone, nitrates and metoprolol. 3. Diabetes continue on sliding scale insulin.  Blood sugars currently stable.  Metformin currently on hold 4. Coronary artery disease.  No complaints of chest pain.  Continue on aspirin Plavix, nitrates and beta-blockers 5. Hyponatremia.  Likely related to hypervolemia.  Resolved with diuretics 6. Peripheral arterial disease status post right below-knee amputation.  Continue on aspirin Plavix 7. COPD.  No wheezing or shortness of breath at this time.  Continue as needed bronchodilator  therapy 8. Left heel ulcer.  Seen by wound care.  Will likely need referral to wound care center.  Appears to be a chronic wound.   DVT prophylaxis: Lovenox Code Status: Full code Family Communication: Discussed with patient Disposition Plan: Status is: Inpatient  Remains inpatient appropriate because:IV treatments appropriate due to intensity of illness or inability to take PO   Dispo: The patient is from: Home              Anticipated d/c is to: Home              Anticipated d/c date is: 1 day-2 days              Patient currently is not medically stable to d/c.    Consultants:   Wound care  Procedures:   Echo  Antimicrobials:   Ancef 4/19 >   Subjective: Feels continued swelling in the lower extremities.  Continues to have pain in left leg reports having pain in flanks.  Feels that abdomen is swollen.  Objective: Vitals:   09/28/19 0738 09/28/19 1155 09/28/19 1228 09/28/19 1553  BP: 103/70 100/77 119/81 108/77  Pulse: (!) 103 (!) 101 (!) 106 98  Resp: 17  20 17   Temp: 98.2 F (36.8 C)   98.1 F (36.7 C)  TempSrc: Oral   Oral  SpO2: 96%  98% 96%  Weight:      Height:        Intake/Output Summary (Last 24 hours) at 09/28/2019 1906 Last data filed at 09/28/2019 1901 Gross per 24 hour  Intake 1057 ml  Output 4350 ml  Net -3293 ml   Filed Weights   09/26/19 2221 09/27/19 0240  Weight: 101.6 kg 99.6 kg    Examination:  General exam: Appears calm and comfortable  Respiratory system: Clear to auscultation. Respiratory effort normal. Cardiovascular system: S1 & S2 heard, RRR. No JVD, murmurs, rubs, gallops or clicks.  Gastrointestinal system: Abdomen is nondistended, soft and nontender. No organomegaly or masses felt. Normal bowel sounds heard. Central nervous system: Alert and oriented. No focal neurological deficits. Extremities: Right BKA, 1+ edema bilaterally Skin: Left lower extremity erythema from lower leg, extending up onto thigh.  Left heel ulcer  appears to be chronic and not involved in cellulitis. Psychiatry: Judgement and insight appear normal. Mood & affect appropriate.     Data Reviewed: I have personally reviewed following labs and imaging studies  CBC: Recent Labs  Lab 09/22/19 1708 09/26/19 2224  WBC 6.5 6.4  HGB 11.7* 11.4*  HCT 37.1* 36.2*  MCV 72.2* 72.0*  PLT 200 974   Basic Metabolic Panel: Recent Labs  Lab 09/22/19 1708 09/26/19 2224 09/27/19 0823 09/28/19 1617  NA 129* 128*  --  137  K 4.1 3.7  --  4.2  CL 88* 88*  --  95*  CO2 30 29  --  34*  GLUCOSE 75 151*  --  106*  BUN 16 15  --  13  CREATININE 1.17 1.15 1.00 1.04  CALCIUM 9.0 8.6*  --  8.9  MG  --   --   --  1.7   GFR: Estimated Creatinine Clearance: 88.1 mL/min (by C-G formula based on SCr of 1.04 mg/dL). Liver Function Tests: No results for input(s): AST, ALT, ALKPHOS, BILITOT, PROT, ALBUMIN in the last 168 hours. No results for input(s): LIPASE, AMYLASE in the last 168 hours. No results for input(s): AMMONIA in the last 168 hours. Coagulation Profile: No results for input(s): INR, PROTIME in the last 168 hours. Cardiac Enzymes: No results for input(s): CKTOTAL, CKMB, CKMBINDEX, TROPONINI in the last 168 hours. BNP (last 3 results) No results for input(s): PROBNP in the last 8760 hours. HbA1C: Recent Labs    09/27/19 0823  HGBA1C 6.6*   CBG: Recent Labs  Lab 09/27/19 1648 09/27/19 2149 09/28/19 0740 09/28/19 1251 09/28/19 1635  GLUCAP 114* 136* 80 109* 98   Lipid Profile: No results for input(s): CHOL, HDL, LDLCALC, TRIG, CHOLHDL, LDLDIRECT in the last 72 hours. Thyroid Function Tests: No results for input(s): TSH, T4TOTAL, FREET4, T3FREE, THYROIDAB in the last 72 hours. Anemia Panel: No results for input(s): VITAMINB12, FOLATE, FERRITIN, TIBC, IRON, RETICCTPCT in the last 72 hours. Sepsis Labs: No results for input(s): PROCALCITON, LATICACIDVEN in the last 168 hours.  Recent Results (from the past 240 hour(s))   SARS CORONAVIRUS 2 (TAT 6-24 HRS) Nasopharyngeal Nasopharyngeal Swab     Status: None   Collection Time: 09/27/19  8:23 AM   Specimen: Nasopharyngeal Swab  Result Value Ref Range Status   SARS Coronavirus 2 NEGATIVE NEGATIVE Final    Comment: (NOTE) SARS-CoV-2 target nucleic acids are NOT DETECTED. The SARS-CoV-2 RNA is generally detectable in upper and lower respiratory specimens during the acute phase of infection. Negative results do not preclude SARS-CoV-2 infection, do not rule out co-infections with other pathogens, and should not be used as the sole basis for treatment or other patient management decisions. Negative results must be combined with clinical observations, patient history, and epidemiological information. The expected result is Negative. Fact Sheet for Patients: SugarRoll.be Fact Sheet  for Healthcare Providers: https://www.woods-mathews.com/ This test is not yet approved or cleared by the [place] and  has been authorized for detection and/or diagnosis of SARS-CoV-2 by FDA under an Emergency Use Authorization (EUA). This EUA will remain  in effect (meaning this test can be used) for the duration of the COVID-19 declaration under Section 56 4(b)(1) of the Act, 21 U.S.C. section 360bbb-3(b)(1), unless the authorization is terminated or revoked sooner. Performed at [facility], [place] [address] [place], [place] [address]          Radiology Studies: DG Chest 2 View  Result Date: 09/26/2019 CLINICAL DATA:  Chest pain EXAM: CHEST - 2 VIEW COMPARISON:  09/22/2019 FINDINGS: Cardiomegaly. No overt edema or effusions. No confluent opacities or acute bony abnormality. IMPRESSION: Cardiomegaly.  No active disease. Electronically Signed   By: Rolm Baptise M.D.   On: 09/26/2019 23:00   US Venous Img Lower Unilateral Left (DVT)  Result Date: 09/27/2019 CLINICAL DATA:  Pain and edema left lower extremity EXAM: LEFT  LOWER EXTREMITY VENOUS DOPPLER ULTRASOUND TECHNIQUE: Gray-scale sonography with graded compression, as well as color Doppler and duplex ultrasound were performed to evaluate the lower extremity deep venous systems from the level of the common femoral vein and including the common femoral, femoral, profunda femoral, popliteal and calf veins including the posterior tibial, peroneal and gastrocnemius veins when visible. The superficial great saphenous vein was also interrogated. Spectral Doppler was utilized to evaluate flow at rest and with distal augmentation maneuvers in the common femoral, femoral and popliteal veins. COMPARISON:  None. FINDINGS: Contralateral Common Femoral Vein: Respiratory phasicity is normal and symmetric with the symptomatic side. No evidence of thrombus. Normal compressibility. Common Femoral Vein: No evidence of thrombus. Normal compressibility, respiratory phasicity and response to augmentation. Saphenofemoral Junction: No evidence of thrombus. Normal compressibility and flow on color Doppler imaging. Profunda Femoral Vein: No evidence of thrombus. Normal flow on color Doppler imaging. Femoral Vein: No evidence of thrombus. Normal respiratory phasicity and response to augmentation. Popliteal Vein: No evidence of thrombus. Normal compressibility, respiratory phasicity and response to augmentation. Calf Veins: No evidence of thrombus. Normal compressibility and flow on color Doppler imaging. IMPRESSION: No significant left lower extremity DVT. Electronically Signed   By: Jerilynn Mages.  Shick M.D.   On: 09/27/2019 11:30   ECHOCARDIOGRAM COMPLETE  Result Date: 09/28/2019    ECHOCARDIOGRAM REPORT   Patient Name:   [name] [name] Date of Exam: 09/28/2019 Medical Rec #:  [id number]      Height:       67.0 in Accession #:    [id number]     Weight:       219.5 lb Date of Birth:  04/18/1961       BSA:          2.104 m Patient Age:    49 years       BP:           103/70 mmHg Patient Gender: M              HR:            103 bpm. Exam Location:  [facility] Procedure: 2D Echo, Cardiac Doppler and Color Doppler Indications:     CHF- acute diastolic 867.67  History:         Patient has prior history of Echocardiogram examinations, most                  recent 11/06/2018. COPD, Signs/Symptoms:Syncope; Risk  Factors:Diabetes. Combined diastolic and systolic CHF.  Sonographer:     Sherrie Sport RDCS (AE) Referring Phys:  [id number] Athena Masse Diagnosing Phys: Nelva Bush MD  Sonographer Comments: Suboptimal apical window. IMPRESSIONS  1. Left ventricular ejection fraction, by estimation, is 20 to 25%. The left ventricle has severely decreased function. The left ventricle demonstrates global hypokinesis. The left ventricular internal cavity size was severely dilated. There is mild left ventricular hypertrophy. Indeterminate diastolic filling due to E-A fusion.  2. Right ventricular systolic function is moderately reduced. The right ventricular size is mildly enlarged. There is moderately elevated pulmonary artery systolic pressure. The estimated right ventricular systolic pressure is 98.3 mmHg.  3. Left atrial size was mildly dilated.  4. Right atrial size was moderately dilated.  5. The mitral valve is degenerative. Mild mitral valve regurgitation. No evidence of mitral stenosis.  6. Tricuspid valve regurgitation is mild to moderate.  7. The aortic valve was not well visualized. Aortic valve regurgitation is not visualized. Mild aortic valve sclerosis is present, with no evidence of aortic valve stenosis.  8. The inferior vena cava is dilated in size with <50% respiratory variability, suggesting right atrial pressure of 15 mmHg. FINDINGS  Left Ventricle: Left ventricular ejection fraction, by estimation, is 20 to 25%. The left ventricle has severely decreased function. The left ventricle demonstrates global hypokinesis. The left ventricular internal cavity size was severely dilated. There is mild left ventricular  hypertrophy. Indeterminate diastolic filling due to E-A fusion. Right Ventricle: The right ventricular size is mildly enlarged. No increase in right ventricular wall thickness. Right ventricular systolic function is moderately reduced. There is moderately elevated pulmonary artery systolic pressure. The tricuspid regurgitant velocity is 3.07 m/s, and with an assumed right atrial pressure of 15 mmHg, the estimated right ventricular systolic pressure is 38.2 mmHg. Left Atrium: Left atrial size was mildly dilated. Right Atrium: Right atrial size was moderately dilated. Pericardium: Trivial pericardial effusion is present. Mitral Valve: The mitral valve is degenerative in appearance. Mild mitral annular calcification. Mild mitral valve regurgitation. No evidence of mitral valve stenosis. Tricuspid Valve: The tricuspid valve is normal in structure. Tricuspid valve regurgitation is mild to moderate. Aortic Valve: The aortic valve was not well visualized. . There is mild thickening of the aortic valve. Aortic valve regurgitation is not visualized. Mild aortic valve sclerosis is present, with no evidence of aortic valve stenosis. There is mild thickening of the aortic valve. Aortic valve mean gradient measures 4.3 mmHg. Aortic valve peak gradient measures 7.4 mmHg. Aortic valve area, by VTI measures 1.15 cm. Pulmonic Valve: The pulmonic valve was grossly normal. Pulmonic valve regurgitation is not visualized. No evidence of pulmonic stenosis. Aorta: The aortic root is normal in size and structure. Pulmonary Artery: The pulmonary artery is not well seen. Venous: The inferior vena cava is dilated in size with less than 50% respiratory variability, suggesting right atrial pressure of 15 mmHg. IAS/Shunts: The interatrial septum was not well visualized. Additional Comments: There is pleural effusion in the left lateral region.  LEFT VENTRICLE PLAX 2D LVIDd:         6.69 cm      Diastology LVIDs:         6.24 cm      LV e'  lateral:   10.90 cm/s LV PW:         1.13 cm      LV E/e' lateral: 7.6 LV IVS:        0.90 cm  LV e' medial:    82.90 cm/s LVOT diam:     2.00 cm      LV E/e' medial:  1.0 LV SV:         23 LV SV Index:   11 LVOT Area:     3.14 cm  LV Volumes (MOD) LV vol d, MOD A4C: 260.0 ml LV vol s, MOD A4C: 203.0 ml LV SV MOD A4C:     260.0 ml RIGHT VENTRICLE RV Basal diam:  4.39 cm RV S prime:     8.16 cm/s TAPSE (M-mode): 1.0 cm LEFT ATRIUM            Index       RIGHT ATRIUM           Index LA diam:      4.80 cm  2.28 cm/m  RA Area:     32.90 cm LA Vol (A4C): 102.0 ml 48.48 ml/m RA Volume:   128.00 ml 60.84 ml/m  AORTIC VALVE                   PULMONIC VALVE AV Area (Vmax):    1.19 cm    PV Vmax:        0.50 m/s AV Area (Vmean):   0.99 cm    PV Peak grad:   1.0 mmHg AV Area (VTI):     1.15 cm    RVOT Peak grad: 1 mmHg AV Vmax:           136.00 cm/s AV Vmean:          95.000 cm/s AV VTI:            0.199 m AV Peak Grad:      7.4 mmHg AV Mean Grad:      4.3 mmHg LVOT Vmax:         51.70 cm/s LVOT Vmean:        29.900 cm/s LVOT VTI:          0.073 m LVOT/AV VTI ratio: 0.37  AORTA Ao Root diam: 3.20 cm MITRAL VALVE                TRICUSPID VALVE MV Area (PHT): 5.54 cm     TR Peak grad:   37.7 mmHg MV Decel Time: 137 msec     TR Vmax:        307.00 cm/s MV E velocity: 82.90 cm/s MV A velocity: 111.00 cm/s  SHUNTS MV E/A ratio:  0.75         Systemic VTI:  0.07 m                             Systemic Diam: 2.00 cm Nelva Bush MD Electronically signed by Nelva Bush MD Signature Date/Time: 09/28/2019/4:32:53 PM    Final         Scheduled Meds: . aspirin EC  81 mg Oral Daily  . atorvastatin  80 mg Oral Daily  . clopidogrel  75 mg Oral Daily  . enoxaparin (LOVENOX) injection  40 mg Subcutaneous Q24H  . ezetimibe  10 mg Oral Daily  . gabapentin  800 mg Oral QID  . insulin aspart  0-15 Units Subcutaneous TID WC  . insulin aspart  0-5 Units Subcutaneous QHS  . isosorbide mononitrate  30 mg Oral Daily   . metoprolol succinate  25 mg Oral Daily  . mometasone-formoterol  2 puff Inhalation BID  . multivitamin  with minerals  1 tablet Oral Daily  . nicotine  21 mg Transdermal Daily  . potassium chloride  10 mEq Oral Daily  . sacubitril-valsartan  1 tablet Oral BID  . spironolactone  25 mg Oral Daily  . torsemide  40 mg Oral BID  . traZODone  150 mg Oral QHS   Continuous Infusions: .  ceFAZolin (ANCEF) IV 1 g (09/28/19 1857)     LOS: 0 days    Time spent: 84mins    Kathie Dike, MD [facility]   If 7PM-7AM, please contact night-coverage [email or web address]  09/28/2019, 7:06 PM

## 2019-09-28 NOTE — Progress Notes (Signed)
*  PRELIMINARY RESULTS* Echocardiogram 2D Echocardiogram has been performed.  [name] [name] 09/28/2019, 2:03 PM

## 2019-09-29 DIAGNOSIS — J441 Chronic obstructive pulmonary disease with (acute) exacerbation: Secondary | ICD-10-CM

## 2019-09-29 DIAGNOSIS — Z794 Long term (current) use of insulin: Secondary | ICD-10-CM

## 2019-09-29 DIAGNOSIS — I255 Ischemic cardiomyopathy: Secondary | ICD-10-CM

## 2019-09-29 DIAGNOSIS — Z720 Tobacco use: Secondary | ICD-10-CM

## 2019-09-29 DIAGNOSIS — I739 Peripheral vascular disease, unspecified: Secondary | ICD-10-CM

## 2019-09-29 DIAGNOSIS — I25118 Atherosclerotic heart disease of native coronary artery with other forms of angina pectoris: Secondary | ICD-10-CM

## 2019-09-29 DIAGNOSIS — E1169 Type 2 diabetes mellitus with other specified complication: Secondary | ICD-10-CM

## 2019-09-29 DIAGNOSIS — L03116 Cellulitis of left lower limb: Principal | ICD-10-CM

## 2019-09-29 DIAGNOSIS — I5022 Chronic systolic (congestive) heart failure: Secondary | ICD-10-CM

## 2019-09-29 LAB — BASIC METABOLIC PANEL
Anion gap: 10 (ref 5–15)
BUN: 15 mg/dL (ref 6–20)
CO2: 32 mmol/L (ref 22–32)
Calcium: 8.5 mg/dL — ABNORMAL LOW (ref 8.9–10.3)
Chloride: 96 mmol/L — ABNORMAL LOW (ref 98–111)
Creatinine, Ser: 1.05 mg/dL (ref 0.61–1.24)
GFR calc Af Amer: >60 mL/min (ref >60)
GFR calc non Af Amer: >60 mL/min (ref >60)
Glucose, Bld: 127 mg/dL — ABNORMAL HIGH (ref 70–99)
Potassium: 3.7 mmol/L (ref 3.5–5.1)
Sodium: 138 mmol/L (ref 135–145)

## 2019-09-29 LAB — CBC
HCT: 33.1 % — ABNORMAL LOW (ref 39.0–52.0)
Hemoglobin: 10.7 g/dL — ABNORMAL LOW (ref 13.0–17.0)
MCH: 22.8 pg — ABNORMAL LOW (ref 26.0–34.0)
MCHC: 32.3 g/dL (ref 30.0–36.0)
MCV: 70.6 fL — ABNORMAL LOW (ref 80.0–100.0)
Platelets: 148 10*3/uL — ABNORMAL LOW (ref 150–400)
RBC: 4.69 MIL/uL (ref 4.22–5.81)
RDW: 23.8 % — ABNORMAL HIGH (ref 11.5–15.5)
WBC: 4.3 10*3/uL (ref 4.0–10.5)
nRBC: 0 % (ref 0.0–0.2)

## 2019-09-29 LAB — GLUCOSE, CAPILLARY
Glucose-Capillary: 111 mg/dL — ABNORMAL HIGH (ref 70–99)
Glucose-Capillary: 133 mg/dL — ABNORMAL HIGH (ref 70–99)
Glucose-Capillary: 113 mg/dL — ABNORMAL HIGH (ref 70–99)
Glucose-Capillary: 114 mg/dL — ABNORMAL HIGH (ref 70–99)

## 2019-09-29 MED ORDER — MAGNESIUM OXIDE 400 (241.3 MG) MG PO TABS
400.0000 mg | ORAL_TABLET | Freq: Every day | ORAL | Status: DC
  Administered 2019-09-29 – 2019-09-30 (×2): 400 mg via ORAL
  Filled 2019-09-29 (×2): qty 1

## 2019-09-29 MED ORDER — POTASSIUM CHLORIDE CRYS ER 10 MEQ PO TBCR
10.0000 meq | EXTENDED_RELEASE_TABLET | Freq: Once | ORAL | Status: AC
  Administered 2019-09-29: 10 meq via ORAL
  Filled 2019-09-29: qty 1

## 2019-09-29 MED ORDER — GUAIFENESIN 100 MG/5ML PO SOLN
5.0000 mL | ORAL | Status: DC | PRN
  Administered 2019-09-29: 100 mg via ORAL
  Filled 2019-09-29 (×2): qty 5

## 2019-09-29 NOTE — Plan of Care (Signed)
  Problem: Coping: Goal: Level of anxiety will decrease Outcome: Progressing   Problem: Elimination: Goal: Will not experience complications related to bowel motility Outcome: Progressing Goal: Will not experience complications related to urinary retention Outcome: Progressing   Problem: Skin Integrity: Goal: Risk for impaired skin integrity will decrease Outcome: Progressing   Problem: Health Behavior/Discharge Planning: Goal: Ability to manage health-related needs will improve Outcome: Progressing   Problem: Clinical Measurements: Goal: Ability to maintain clinical measurements within normal limits will improve Outcome: Progressing Goal: Will remain free from infection Outcome: Progressing Goal: Respiratory complications will improve Outcome: Progressing   Problem: Activity: Goal: Risk for activity intolerance will decrease Outcome: Progressing   Problem: Nutrition: Goal: Adequate nutrition will be maintained Outcome: Progressing

## 2019-09-29 NOTE — Consult Note (Addendum)
Cardiology Consultation:   Patient ID: [name] [name]; [id number]; [birth year]   Admit date: 09/27/2019 Date of Consult: 09/29/2019  Primary Care Provider: [name], MD Primary Cardiologist: Rockey Situ   Patient Profile:   [name] [name] is a 59 y.o. 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 Roger Shelter.  History of Present Illness:   [name] [name] previously underwent LAD and RCA interventions in 2013 and 2014, respectively. In 04/2017, he was found to have new onset cardiomyopathy with an EF of 25-30%. Diagnostic cath showed severe multivessel CAD with recommendation for CT surgery referral. He sought second opinion at [facility], and was followed by them briefly. Cardiac MRI in 08/2017, revealed an EF of 19% with multiple areas of ischemia/viability and was referred to CT surgery at [facility], with recommendation to optimize medically prior to CABG. He subsequently declined CABG. Echos over the years have shown a persistent cardiomyopathy as detailed below.   He was seen in the ED in 08/2018 with lower extremity swelling. Workup completed prior to leaving included lower extremity Doppler, which was negative for DVT, CTA chest was negative for PE with a moderate right pleural effusion. He was given IV Lasix and DuoNebs. He left AMA. He returned to the ED on 09/10/2019 with worsening swelling in the setting of medical noncompliance. He was treated with IV Lasix and discharged. Following this, he was evaluated by the  [facility] and has required IV Lasix. He was also sent for abdominal ultrasound for consideration of paracentesis, though imaging showed no evidence of fluid collection and this was not performed.   He was seen in our office on 09/22/2019, and noted to be significantly volume up with a ReDs vest of 52% and a weight trend of 209 pounds in 06/2019 to 223 pounds at that visit. He was sent to the ED, though left without being seen.   He subsequently returned to the ED on 09/27/2019, with continued lower extremity swelling with associated erythema. He was diagnosed with lower extremity cellulitis. Doppler negative for DVT. He was started on antibiotic therapy. He was given a dose of IV Lasix in the ED with reported good UOP. He was subsequently transitioned back to PTA torsemide on 4/20 as well as PTA Entresto, spironolactone, Imdur, metoprolol, ASA, Plavix, Lipitor, and Zetia. Labs showed an initial troponin of 32 with a delta of 34. BNP 1342, which was improved from 2365 just 1 week prior. Documented UOP of 6.4 L for the admission, though there is minimal documented input, making this inaccurate. No recent weights for review. Dyspnea much improved. No chest pain, palpitations, dizziness, presyncope or syncope. He has stable 3-pillow orthopnea. Swelling and erythema of the left lower extremity are improving.    Past Medical History:  Diagnosis Date  . Arthritis   . Asthma   . Atherosclerosis   . BPH (benign prostatic hyperplasia)   . Carotid arterial disease ([place])   . Charcot's joint of foot, right   . Chronic combined systolic (congestive) and  diastolic (congestive) heart failure ([place])    a. 02/2013 EF 50% by LV gram; b. 04/2017 Echo: EF 25-30%. diff HK. Gr2 DD. Mod dil LA/RV. PASP 43mmHg.  [name] COPD (chronic obstructive pulmonary disease) ([place])   . Coronary artery disease    a. 2013 S/P PCI of LAD [facility]);  b. 03/2013 PCI: RCA 90p ( 2.5 x 23 mm DES); c. 02/2014 Cath: patent RCA stent->Med Rx; d.  04/2017 Cath: LM nl, LAd 30ost/p, 37m/d, D1 80ost, LCX 90p/m, OM1 85, RCA 70/20p, 7m, 70d->Referred for CT Surg-felt to be poor candidate.  . Diabetic neuropathy ([place])   . Diabetic ulcer of right foot ([place])   . Hernia   . Hyperlipidemia   . Hypertension   . Ischemic cardiomyopathy    a. 04/2017 Echo: EF 25-30%; b. 08/2017 Cardiac MRI ([facility]): EF 19%, sev glob HK. RVEF 20%, mod BAE, triv MR, mild-mod TR. Basal lateral subendocardial infarct (viable), inf/infsept, dist septal ischemia, basal to mid lat peri-infarct ischemia.  . Morbid obesity ([place])   . Neuropathy   . PAD (peripheral artery disease) ([place])    a. Followed by Dr. Lucky Cowboy; b. 08/2010 Periph Angio: RSFA 70-80p (6X60 self-expanding stent); c. 09/2010 Periph Angio: L SFA 100p (7x unknown length self-expanding stent); d. 06/2015 Periph Angio: R SFA short segment occlusion (6x12 self-expanding stent).  . Restless leg syndrome   . Sleep apnea   . Subclavian artery stenosis, right ([place])   . Syncope and collapse   . Tobacco use    a. 75+ yr hx - still smoking 1ppd, down from 2 ppd.  . Type II diabetes mellitus ([place])   . Varicose vein     Past Surgical History:  Procedure Laterality Date  . ABDOMINAL AORTAGRAM N/A 06/23/2013   Procedure: ABDOMINAL Maxcine Ham;  Surgeon: Wellington Hampshire, MD;  Location: [place] CATH LAB;  Service: Cardiovascular;  Laterality: N/A;  . AMPUTATION Right 04/01/2018   Procedure: AMPUTATION BELOW KNEE;  Surgeon: Algernon Huxley, MD;  Location: [facility] ORS;  Service: Vascular;  Laterality: Right;  . APPENDECTOMY    . CARDIAC CATHETERIZATION  10/14   [facility]; X1 STENT PROXIMAL RCA  . CARDIAC CATHETERIZATION  02/2010   [facility]  . CARDIAC CATHETERIZATION  02/21/2014   [facility]  . CLAVICLE SURGERY    . HERNIA REPAIR    . IRRIGATION AND DEBRIDEMENT FOOT Right 01/04/2018   Procedure: IRRIGATION AND DEBRIDEMENT FOOT;  Surgeon: Samara Deist, DPM;  Location: [facility] ORS;  Service: Podiatry;  Laterality: Right;  . LEFT HEART CATH AND CORS/GRAFTS  ANGIOGRAPHY N/A 04/28/2017   Procedure: LEFT HEART CATH AND CORONARY ANGIOGRAPHY;  Surgeon: Wellington Hampshire, MD;  Location: [place] CV LAB;  Service: Cardiovascular;  Laterality: N/A;  . LOWER EXTREMITY ANGIOGRAPHY Right 03/03/2018   Procedure: LOWER EXTREMITY ANGIOGRAPHY;  Surgeon: Katha Cabal, MD;  Location: [place] CV LAB;  Service: Cardiovascular;  Laterality: Right;  . LOWER EXTREMITY ANGIOGRAPHY Left 08/24/2018   Procedure: LOWER EXTREMITY ANGIOGRAPHY;  Surgeon: Algernon Huxley, MD;  Location: [place] CV LAB;  Service: Cardiovascular;  Laterality: Left;  . LOWER EXTREMITY ANGIOGRAPHY Left 06/14/2019   Procedure: LOWER EXTREMITY ANGIOGRAPHY;  Surgeon: Algernon Huxley, MD;  Location: [place] CV LAB;  Service: Cardiovascular;  Laterality: Left;  . LOWER EXTREMITY ANGIOGRAPHY Left 08/02/2019   Procedure: LOWER EXTREMITY ANGIOGRAPHY;  Surgeon: Algernon Huxley, MD;  Location: [place] CV LAB;  Service: Cardiovascular;  Laterality: Left;  . PERIPHERAL ARTERIAL STENT GRAFT  x2 left/right  . PERIPHERAL VASCULAR BALLOON ANGIOPLASTY Right 01/06/2018   Procedure: PERIPHERAL VASCULAR BALLOON ANGIOPLASTY;  Surgeon: Katha Cabal, MD;  Location: [place] CV LAB;  Service: Cardiovascular;  Laterality: Right;  . PERIPHERAL VASCULAR CATHETERIZATION N/A 07/06/2015   Procedure: Abdominal Aortogram w/Lower Extremity;  Surgeon: Algernon Huxley, MD;  Location: [place] CV LAB;  Service: Cardiovascular;  Laterality: N/A;  . PERIPHERAL VASCULAR CATHETERIZATION  07/06/2015   Procedure: Lower Extremity Intervention;  Surgeon: Algernon Huxley, MD;  Location: [place] CV LAB;  Service: Cardiovascular;;     Home Meds: Prior to Admission medications   Medication Sig Start Date End Date Taking? Authorizing Provider  albuterol (VENTOLIN HFA) 108 (90 Base) MCG/ACT inhaler Inhale into the lungs every 6 (six) hours as needed for wheezing or shortness of breath.   Yes [provider]  aspirin EC 81 MG tablet Take 1 tablet (81 mg total) by mouth daily. 05/11/15  Yes Wellington Hampshire, MD  atorvastatin (LIPITOR) 80 MG tablet Take 80 mg by mouth daily.   Yes [provider]  calcium carbonate (TUMS - DOSED IN MG ELEMENTAL CALCIUM) 500 MG chewable tablet Chew 4 tablets by mouth daily as needed for indigestion or heartburn.   Yes [provider]  clopidogrel (PLAVIX) 75 MG tablet Take 75 mg by mouth daily.  08/22/17  Yes [provider]  collagenase (SANTYL) ointment Apply 1 application topically daily. 08/11/19  Yes Kris Hartmann, NP  doxycycline (VIBRAMYCIN) 100 MG capsule Take 1 capsule (100 mg total) by mouth 2 (two) times daily. 09/08/19  Yes Kris Hartmann, NP  ENTRESTO 24-26 MG TAKE 1 TABLET BY MOUTH TWICE DAILY 06/19/19  Yes Darylene Price A, FNP  ezetimibe (ZETIA) 10 MG tablet Take 1 tablet (10 mg total) by mouth daily. 05/19/19  Yes Hackney, Otila Kluver A, FNP  gabapentin (NEURONTIN) 800 MG tablet Take 800 mg by mouth 4 (four) times daily.  08/14/17 09/27/19 Yes [provider]  Insulin Glargine (LANTUS SOLOSTAR) 100 UNIT/ML Solostar Pen Inject 30 Units into the skin at bedtime. Patient taking differently: Inject 20-40 Units into the skin 2 (two) times daily. 40 units in the morning and 20 units at night 04/07/18  Yes Mayo, Pete Pelt, MD  insulin lispro (HUMALOG) 100 UNIT/ML injection Sliding Scale Insulin before meals using Novolog If BS is 0 to 70 no insulin and eat/glucose tablets/glucose get If BS 71 to 150 great! If BS 151 to 200, 2 Units Novolog If BS 201 to 250, 4 Units Novolog If BS 251 to 300, 6 Units Novolog If BS 301 to 350, 8 Units Novolog If BS 351 to 400, 10 Units Novolog If BS greater than 400, take 10 Units and Contact our office during office hour 04/14/18  Yes [provider]  ipratropium-albuterol (DUONEB) 0.5-2.5 (3) MG/3ML SOLN Inhale 3 mLs into the lungs every 6 (six) hours as needed (wheezing/sob).    Yes  [provider]  isosorbide mononitrate (IMDUR) 30 MG 24 hr tablet Take 1 tablet (30 mg total) by mouth daily. 05/19/19  Yes Hackney, Otila Kluver A, FNP  metFORMIN (GLUCOPHAGE-XR) 500 MG 24 hr tablet Take 1,000 mg by mouth 2 (two) times daily.    Yes [provider]  metoprolol succinate (TOPROL-XL) 25 MG 24 hr tablet Take 1 tablet (25 mg total) by mouth daily. 07/02/19 08/29/20 Yes Loel Dubonnet, NP  Multiple Vitamin (MULTI-VITAMINS) TABS Take 1 tablet by mouth daily.    Yes [provider]  nitroGLYCERIN (NITRODUR - DOSED IN MG/24 HR) 0.4 mg/hr patch Place 0.4 mg onto the skin daily.   Yes [provider]  nitroGLYCERIN (NITROSTAT) 0.4 MG SL tablet Place 1 tablet (0.4 mg total) under the tongue every 5 (five) minutes as needed for chest pain. 05/19/19  Yes Darylene Price A, FNP  Oxycodone HCl 10 MG TABS Take 10 mg by mouth every 6 (six) hours as needed (pain).  01/15/18  Yes [provider]  potassium chloride (KLOR-CON) 10 MEQ tablet Take 1 tablet (10 mEq total) by mouth daily. 05/19/19  Yes Hackney, Otila Kluver A, FNP  promethazine (PHENERGAN) 25 MG tablet Take 25 mg by mouth every 6 (six) hours as needed for nausea.  04/29/18  Yes [provider]  spironolactone (ALDACTONE) 25 MG tablet Take 25 mg by mouth daily.  03/02/18  Yes [provider]  sulfamethoxazole-trimethoprim (BACTRIM DS) 800-160 MG tablet Take 1 tablet by mouth 2 (two) times daily. 07/22/19  Yes Kris Hartmann, NP  torsemide (DEMADEX) 20 MG tablet Take 2 tablets (40 mg total) by mouth 2 (two) times daily. 40mg  AM/ 20mg  PM Patient taking differently: Take 40 mg by mouth 2 (two) times daily.  08/31/19  Yes Minna Merritts, MD  traZODone (DESYREL) 150 MG tablet Take 150 mg by mouth at bedtime. 07/12/19  Yes [provider]    Inpatient Medications: Scheduled Meds: . aspirin EC  81 mg Oral Daily  . atorvastatin  80 mg Oral Daily  . clopidogrel  75 mg Oral Daily  . enoxaparin  (LOVENOX) injection  40 mg Subcutaneous Q24H  . ezetimibe  10 mg Oral Daily  . gabapentin  800 mg Oral QID  . insulin aspart  0-15 Units Subcutaneous TID WC  . insulin aspart  0-5 Units Subcutaneous QHS  . isosorbide mononitrate  30 mg Oral Daily  . metoprolol succinate  25 mg Oral Daily  . mometasone-formoterol  2 puff Inhalation BID  . multivitamin with minerals  1 tablet Oral Daily  . nicotine  21 mg Transdermal Daily  . potassium chloride  10 mEq Oral Daily  . sacubitril-valsartan  1 tablet Oral BID  . spironolactone  25 mg Oral Daily  . torsemide  40 mg Oral BID  . traZODone  150 mg Oral QHS   Continuous Infusions: .  ceFAZolin (ANCEF) IV Stopped (09/29/19 0354)   PRN Meds: acetaminophen **OR** acetaminophen, calcium carbonate, ipratropium-albuterol, morphine injection, nitroGLYCERIN, ondansetron **OR** ondansetron (ZOFRAN) IV, oxyCODONE  Allergies:   Allergies  Allergen Reactions  . Dulaglutide Anaphylaxis, Diarrhea and Hives  . Other Itching    Skin itching associated with nitro patch  . Prednisone Rash    Social History:   Social History   Socioeconomic History  . Marital status: Single    Spouse name: Not on file  . Number of children: 0  . Years of education: 81  . Highest education level: 12th grade  Occupational History  . Occupation: on disability  Tobacco Use  . Smoking status: Current Every Day Smoker    Packs/day: 1.00    Years: 41.00    Pack years: 41.00    Types: Cigarettes  . Smokeless tobacco: Never Used  Substance and Sexual Activity  . Alcohol use: No  . Drug use: No  . Sexual activity: Yes  Other Topics Concern  . Not on file  Social History Narrative   Lives at home in [place] with girlfriend. Independent at baseline.   Social  Determinants of Health   Financial Resource Strain:   . Difficulty of Paying Living Expenses:   Food Insecurity:   . Worried About Charity fundraiser in the Last Year:   . Arboriculturist in the Last  Year:   Transportation Needs:   . Film/video editor (Medical):   [name] Lack of Transportation (Non-Medical):   Physical Activity:   . Days of Exercise per Week:   . Minutes of Exercise per Session:   Stress:   . Feeling of Stress :   Social Connections:   . Frequency of Communication with Friends and Family:   . Frequency of Social Gatherings with Friends and Family:   . Attends Religious Services:   . Active Member of Clubs or Organizations:   . Attends Archivist Meetings:   [name] Marital Status:   Intimate Partner Violence:   . Fear of Current or Ex-Partner:   . Emotionally Abused:   [name] Physically Abused:   . Sexually Abused:      Family History:   Family History  Problem Relation Age of Onset  . Heart attack Father   . Heart disease Father   . Hypertension Father   . Hyperlipidemia Father   . Hypertension Mother   . Hyperlipidemia Mother     ROS:  Review of Systems  Constitutional: Positive for malaise/fatigue. Negative for chills, diaphoresis, fever and weight loss.  HENT: Negative for congestion.   Eyes: Negative for discharge and redness.  Respiratory: Positive for shortness of breath. Negative for cough, sputum production and wheezing.   Cardiovascular: Positive for orthopnea and leg swelling. Negative for chest pain, palpitations, claudication and PND.  Gastrointestinal: Negative for abdominal pain, heartburn, nausea and vomiting.  Musculoskeletal: Negative for falls and myalgias.  Skin: Negative for rash.  Neurological: Positive for weakness. Negative for dizziness, tingling, tremors, sensory change, speech change, focal weakness and loss of consciousness.  Endo/Heme/Allergies: Does not bruise/bleed easily.  Psychiatric/Behavioral: Negative for substance abuse. The patient is not nervous/anxious.   All other systems reviewed and are negative.     Physical Exam/Data:   Vitals:   09/28/19 1553 09/28/19 2247 09/28/19 2355 09/29/19 0802  BP: 108/77  103/73 110/70 109/81  Pulse: 98 90 91 91  Resp: 17 18 16 18   Temp: 98.1 F (36.7 C)  97.7 F (36.5 C) 98.1 F (36.7 C)  TempSrc: Oral   Oral  SpO2: 96% 99% 92% 91%  Weight:      Height:        Intake/Output Summary (Last 24 hours) at 09/29/2019 0808 Last data filed at 09/29/2019 0700 Gross per 24 hour  Intake 768.43 ml  Output 2525 ml  Net -1756.57 ml   Filed Weights   09/26/19 2221 09/27/19 0240  Weight: 101.6 kg 99.6 kg   Body mass index is 34.38 kg/m.   Physical Exam: General: Well developed, well nourished, in no acute distress. Head: Normocephalic, atraumatic, sclera non-icteric, no xanthomas, nares without discharge.  Neck: Negative for carotid bruits. JVD not elevated. Lungs: Diminished and coarse breath sounds bilaterally. Breathing is unlabored. Heart: RRR with S1 S2. No murmurs, rubs, or gallops appreciated. Abdomen: Soft, non-tender, non-distended with normoactive bowel sounds. No hepatomegaly. No rebound/guarding. No obvious abdominal masses. Msk:  Strength and tone appear normal for age. Extremities: Status post right BKA with no significant stump swelling. Mild erythema with pretibial edema and scabs noted on the left lower extremity. Neuro: Alert and oriented X 3. No facial asymmetry.  No focal deficit. Moves all extremities spontaneously. Psych:  Responds to questions appropriately with a normal affect.   EKG:  The EKG was personally reviewed and demonstrates: sinus tachycardia, 105 bpm, RBBB, baseline artifact Telemetry:  Telemetry was personally reviewed and demonstrates: SR with PVCs, 2 runs of NSVT lasting 8 and 17 beats.   Weights: Filed Weights   09/26/19 2221 09/27/19 0240  Weight: 101.6 kg 99.6 kg    Relevant CV Studies:  2D echo 09/28/2019: 1. Left ventricular ejection fraction, by estimation, is 20 to 25%. The  left ventricle has severely decreased function. The left ventricle  demonstrates global hypokinesis. The left ventricular internal  cavity size  was severely dilated. There is mild  left ventricular hypertrophy. Indeterminate diastolic filling due to E-A  fusion.  2. Right ventricular systolic function is moderately reduced. The right  ventricular size is mildly enlarged. There is moderately elevated  pulmonary artery systolic pressure. The estimated right ventricular  systolic pressure is 42.7 mmHg.  3. Left atrial size was mildly dilated.  4. Right atrial size was moderately dilated.  5. The mitral valve is degenerative. Mild mitral valve regurgitation. No  evidence of mitral stenosis.  6. Tricuspid valve regurgitation is mild to moderate.  7. The aortic valve was not well visualized. Aortic valve regurgitation  is not visualized. Mild aortic valve sclerosis is present, with no  evidence of aortic valve stenosis.  8. The inferior vena cava is dilated in size with <50% respiratory  variability, suggesting right atrial pressure of 15 mmHg.  __________  2D echo 10/2018: 1. The left ventricle has severely reduced systolic function, with an  ejection fraction of 20-25%. The cavity size was moderately dilated.  Indeterminate diastolic filling due to E-A fusion. Left ventricular  diffuse hypokinesis.  2. The right ventricle has severely reduced systolic function. The cavity  was mildly enlarged. There is no increase in right ventricular wall  thickness. Right ventricular systolic pressure is moderately elevated with  an estimated pressure of 59.6 mmHg.  3. Left atrial size was mildly dilated.  4. Right atrial size was mildly dilated.  5. The mitral valve is degenerative. Mild thickening of the mitral valve  leaflet. Mild calcification of the mitral valve leaflet. There is mild  mitral annular calcification present.  6. The tricuspid valve is degenerative. Tricuspid valve regurgitation is  moderate.  7. The aortic valve was not well visualized. Moderate thickening of the  aortic valve. Moderate  calcification of the aortic valve.  8. The inferior vena cava was dilated in size with <50% respiratory  variability.  __________  2D echo 12/2017: - Left ventricle: The cavity size was moderately dilated. Wall  thickness was increased in a pattern of mild LVH. Systolic  function was moderately to severely reduced. The estimated  ejection fraction was in the range of 30% to 35%. Diffuse  hypokinesis. Regional wall motion abnormalities cannot be  excluded. Doppler parameters are consistent with restrictive  physiology, indicative of decreased left ventricular diastolic  compliance and/or increased left atrial pressure. Doppler  parameters are consistent with high ventricular filling pressure.  - Mitral valve: Calcified annulus. Mildly thickened leaflets .  - Left atrium: The atrium was mildly dilated.  - Right ventricle: The cavity size was mildly to moderately  dilated. Systolic function was normal.  - Right atrium: The atrium was mildly dilated.  - Pulmonary arteries: Systolic pressure was mildly to moderately  increased, in the range of 40 mm Hg to 45 mm Hg.  Impressions:   - No definite evidence of endocarditis, though evaluation of the  valves is limited. Recommend transesophageal echocardiogram if  clinical concern for infectious endocarditis persists.  __________  2D echo 11/2017: - Left ventricle: The cavity size was mildly dilated. There was  mild concentric hypertrophy. Systolic function was severely  reduced. The estimated ejection fraction was in the range of 25%  to 30%. Akinesis of the inferolateral and inferior myocardium.  Akinesis of the apical myocardium. Features are consistent with a  pseudonormal left ventricular filling pattern, with concomitant  abnormal relaxation and increased filling pressure (grade 2  diastolic dysfunction).  - Left atrium: The atrium was mildly dilated.  - Pulmonary arteries: Systolic pressure  could not be accurately  estimated.  __________   LHC 04/2017: Prox RCA-1 lesion is 70% stenosed.  Prox RCA-2 lesion is 20% stenosed.  Mid RCA lesion is 80% stenosed.  Mid RCA to Dist RCA lesion is 70% stenosed.  Ost LAD to Prox LAD lesion is 30% stenosed.  Ost 1st Diag to 1st Diag lesion is 80% stenosed.  Prox LAD to Mid LAD lesion is 60% stenosed.  Dist LAD lesion is 60% stenosed.  Prox Cx to Mid Cx lesion is 90% stenosed.  Ost 1st Mrg to 1st Mrg lesion is 85% stenosed.   1.  Significant three-vessel coronary artery disease with patent stent in the RCA and LAD.  There is significant proximal RCA disease before the stent as well as diffuse mid and distal disease in a vessel that appears to be about 2.5 mm in diameter.  There is significant bifurcation stenosis in the proximal left circumflex with a large OM1.  The LAD has moderate disease. The coronary arteries are moderately calcified and diffusely diseased throughout. 2.  Left ventricular angiography was not performed.  EF was moderately to severely reduced by echo. 3.  Severely elevated left ventricular end-diastolic pressure at 34 mmHg.  Recommendations: This is overall a difficult situation.  Revascularization options are somewhat limited due to diffuse disease overall.  Given that he is diabetic and has cardiomyopathy, best option might be CABG if he is found to be a candidate.  We need to optimize his heart failure first with outpatient referral to cardiothoracic surgery for evaluation.  I am going to increase intravenous diuresis today and the patient possibly can be discharged home tomorrow on medical therapy. __________  2D echo 04/2017: - Left ventricle: The cavity size was normal. Systolic function was  severely reduced. The estimated ejection fraction was in the  range of 25% to 30%. Diffuse hypokinesis. Regional wall motion  abnormalities cannot be excluded. Features are consistent with a   pseudonormal left ventricular filling pattern, with concomitant  abnormal relaxation and increased filling pressure (grade 2  diastolic dysfunction).  - Left atrium: The atrium was moderately dilated.  - Right ventricle: The cavity size was mildly dilated. Wall  thickness was normal. Systolic function was mildly reduced.  - Pulmonary arteries: PA peak pressure: 34 mm Hg (S). __________  2D echo 12/2014: - Left ventricle: The cavity size was normal. There was mild  concentric hypertrophy. Systolic function was normal. The  estimated ejection fraction was in the range of 55% to 60%. Wall  motion was normal; there were no regional wall motion  abnormalities. Doppler parameters are consistent with abnormal  left ventricular relaxation (grade 1 diastolic dysfunction).   Laboratory Data:  Chemistry Recent Labs  Lab 09/26/19 2224 09/26/19 2224 09/27/19 6045 09/28/19 1617 09/29/19 0402  NA 128*  --   --  137 138  K 3.7  --   --  4.2 3.7  CL 88*  --   --  95* 96*  CO2 29  --   --  34* 32  GLUCOSE 151*  --   --  106* 127*  BUN 15  --   --  13 15  CREATININE 1.15   < > 1.00 1.04 1.05  CALCIUM 8.6*  --   --  8.9 8.5*  GFRNONAA >60   < > >60 >60 >60  GFRAA >60   < > >60 >60 >60  ANIONGAP 11  --   --  8 10   < > = values in this interval not displayed.    No results for input(s): PROT, ALBUMIN, AST, ALT, ALKPHOS, BILITOT in the last 168 hours. Hematology Recent Labs  Lab 09/22/19 1708 09/26/19 2224 09/29/19 0402  WBC 6.5 6.4 4.3  RBC 5.14 5.03 4.69  HGB 11.7* 11.4* 10.7*  HCT 37.1* 36.2* 33.1*  MCV 72.2* 72.0* 70.6*  MCH 22.8* 22.7* 22.8*  MCHC 31.5 31.5 32.3  RDW 23.9* 23.5* 23.8*  PLT 200 188 148*   Cardiac EnzymesNo results for input(s): TROPONINI in the last 168 hours. No results for input(s): TROPIPOC in the last 168 hours.  BNP Recent Labs  Lab 09/22/19 1708 09/26/19 2224  BNP 2,365.0* 1,342.0*    DDimer No results for input(s): DDIMER in  the last 168 hours.  Radiology/Studies:  DG Chest 2 View  Result Date: 09/26/2019 IMPRESSION: Cardiomegaly.  No active disease. Electronically Signed   By: Rolm Baptise M.D.   On: 09/26/2019 23:00   US Venous Img Lower Unilateral Left (DVT)  Result Date: 09/27/2019 IMPRESSION: No significant left lower extremity DVT. Electronically Signed   By: Jerilynn Mages.  Shick M.D.   On: 09/27/2019 11:30    Assessment and Plan:   1. 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: -Exacerbated by medical noncompliance  -Net negative 6.4 L for the admission, though there is minimal input documented -No recent weights for review, recommend daily weights  -Recommend compliance with GDMT including Entresto, Toprol, spironolactone, and torsemide -Should he demonstrate continued compliance with medical care, would have him evaluated by EP in the outpatient setting for possible CRT-D given his widened QRS complex and persistent cardiomyopathy, though it remains uncertain if he would be a candidate for this given his noncompliance and prior refusal of revascularization  -Check ReDs vest -CHF education -Strict I/O  2. 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 PVCs/NSVT: -Asymptomatic  -Likely in the setting of his cardiomyopathy  -Toprol -No indication for AAT -Replete magnesium and potassium to goal 2.0 and 4.0 respectively  -Consider outpatient Zio patch to quantify PVC burden, as this may be contributing to his cardiomyopathy   4. Lower extremity cellulitis with PAD: -On ASA/Plavix as above -Per IM -Follow up with vascular  surgery as directed   5. CKD stage III: -Stable  6. HTN: -Blood pressure is well controlled -Continue current therapy as above  7. HLD: -LDL 46 from 11/2018 -Lipitor and Zetia  8. COPD with ongoing tobacco use: -Management per IM -Complete cessation is advised  9. Obesity with OSA: -Weight loss advised -Compliance with CPAP recommended   10. Anemia: -Stable   For questions or updates, please contact [place] Please consult [email or web address] for contact info under Cardiology/STEMI.   Signed, Christell Faith, PA-C [place] Pager: [phone] 09/29/2019, 8:08 AM

## 2019-09-29 NOTE — Evaluation (Signed)
Physical Therapy Evaluation Patient Details Name: [name] [name] MRN: [id number] DOB: 15-May-1961 Today's Date: 09/29/2019   History of Present Illness  presented to ER secondary to L LE pain, redness; admitted for management of L LE cellulitis.  Clinical Impression  Upon evaluation, patient alert and oriented; follows commands and agreeable for participation with session.  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  Able to complete bed mobility with mod indep; sit/stand, basic transfers and gait (100') with RW, cga/close sup.  Demonstrates reciprocal stepping pattern; mod WBing bilat  UEs; fair/good LE strength/control. Mod SOB (BORG 8/10), but sats >95% on RA Would benefit from skilled PT to address above deficits and promote optimal return to PLOF.; Recommend transition to HHPT upon discharge from acute hospitalization.     Follow Up Recommendations Home health PT    Equipment Recommendations       Recommendations for Other Services       Precautions / Restrictions Precautions Precautions: Fall Restrictions Weight Bearing Restrictions: No      Mobility  Bed Mobility Overal bed mobility: Independent                Transfers Overall transfer level: Needs assistance Equipment used: Rolling walker (2 wheeled) Transfers: Sit to/from Stand Sit to Stand: Min guard         General transfer comment: multiple attempts and use of bilat UEs required for lift off with sit/stand  Ambulation/Gait Ambulation/Gait assistance: Min guard Gait Distance (Feet): 100 Feet Assistive device: Rolling walker (2 wheeled)       General Gait Details: reciprocal stepping pattern; mod WBing bilat  UEs; fair/good LE strength/control. Mod SOB (BORG 8/10), but sats >95% on RA  Stairs            Wheelchair Mobility    Modified Rankin (Stroke Patients Only)       Balance Overall balance  assessment: Needs assistance Sitting-balance support: No upper extremity supported;Feet supported Sitting balance-Leahy Scale: Good     Standing balance support: Bilateral upper extremity supported Standing balance-Leahy Scale: Fair                               Pertinent Vitals/Pain Pain Assessment: No/denies pain    Home Living Family/patient expects to be discharged to:: Private residence Living Arrangements: Spouse/significant other Available Help at Discharge: Family Type of Home: House Home Access: Ramped entrance     Home Layout: One level Home Equipment: Environmental consultant - 2 wheels;Cane - single point;Wheelchair - manual      Prior Function Level of Independence: Independent with assistive device(s)         Comments: WC level (manual) as primary mobility, but does ambulate short distances (requiring UE support at all times, "I hold on to things close by").     Hand Dominance        Extremity/Trunk Assessment   Upper Extremity Assessment Upper Extremity Assessment: Overall WFL for tasks assessed    Lower Extremity Assessment Lower Extremity Assessment: Overall WFL for tasks assessed(R BKA, well-healed; L LE with baseline neuropathy (mid-calf distally))       Communication   Communication: No difficulties  Cognition Arousal/Alertness: Awake/alert Behavior During Therapy: WFL for tasks assessed/performed Overall Cognitive Status: Within Functional Limits for tasks assessed  General Comments      Exercises Other Exercises Other Exercises: Indep dons R LE prosthesis; min assist for donning L sock/shoe for gait efforts   Assessment/Plan    PT Assessment Patient needs continued PT services  PT Problem List Decreased activity tolerance;Decreased balance;Decreased mobility;Cardiopulmonary status limiting activity       PT Treatment Interventions DME instruction;Gait training;Stair  training;Functional mobility training;Therapeutic activities;Therapeutic exercise;Balance training    PT Goals (Current goals can be found in the Care Plan section)  Acute Rehab PT Goals Patient Stated Goal: to return home PT Goal Formulation: With patient Time For Goal Achievement: 10/13/19 Potential to Achieve Goals: Good    Frequency Min 2X/week   Barriers to discharge        Co-evaluation               AM-PAC PT "6 Clicks" Mobility  Outcome Measure Help needed turning from your back to your side while in a flat bed without using bedrails?: None Help needed moving from lying on your back to sitting on the side of a flat bed without using bedrails?: None Help needed moving to and from a bed to a chair (including a wheelchair)?: A Little Help needed standing up from a chair using your arms (e.g., wheelchair or bedside chair)?: A Little Help needed to walk in hospital room?: A Little Help needed climbing 3-5 steps with a railing? : A Little 6 Click Score: 20    End of Session Equipment Utilized During Treatment: Gait belt Activity Tolerance: Patient tolerated treatment well Patient left: in chair;with call bell/phone within reach;with chair alarm set Nurse Communication: Mobility status PT Visit Diagnosis: Muscle weakness (generalized) (M62.81);Difficulty in walking, not elsewhere classified (R26.2)    Time: 1610-9604 PT Time Calculation (min) (ACUTE ONLY): 21 min   Charges:   PT Evaluation $PT Eval Moderate Complexity: 1 Mod PT Treatments $Gait Training: 8-22 mins        Ruthel Martine H. Owens Shark, PT, DPT, NCS 09/29/19, 1:41 PM [phone]

## 2019-09-29 NOTE — Progress Notes (Signed)
PROGRESS NOTE    Patient: [name] [name]                            PCP: [name], MD                    DOB: 1960/08/26            DOA: 09/27/2019 [id number]             DOS: 09/29/2019, 1:23 PM   LOS: 1 day   Date of Service: The patient was seen and examined on 09/29/2019  Subjective:   The patient was seen and examined this morning, very pleasant, compliant. Reporting improved erythema edema in his leg.   Brief Narrative:   59 year old male with a history of chronic systolic congestive heart failure, ejection fraction of 20 to 25%, COPD, diabetes, coronary artery disease, presents to the hospital with increasing left leg pain, redness and swelling.   Found to have cellulitis of left lower extremity admitted for IV antibiotics.  He also had some evidence of volume overload and received a dose of intravenous Lasix.   He is continued on his home dose of torsemide now.   Assessment & Plan:   Principal Problem:   Cellulitis of left lower leg Active Problems:   PAD (peripheral artery disease) (HCC)   Tobacco use   Cellulitis   Chronic systolic CHF (congestive heart failure) (HCC)   Type 2 diabetes mellitus with other specified complication (HCC)   Hyponatremia   Left lower extremity cellulitis.  -Patient remained stable, afebrile normotensive -Left lower extremity erythema edema improving -Continue IV antibiotics -Venous Doppler negative for DVT  Acute on chronic systolic congestive heart failure.  -According to last echo Ejection fraction of 20 to 25%.  Reviewed, -On is received Lasix previously, currently on torsemide -Monitoring I's and O's, daily weight (net - 6.4 L since admission) -His home medication of Entresto, spironolactone, metoprolol and nitrates was resumed -This primary cardiology team has been consulted -appreciate their assistance   Diabetes mellitus type II  -CBG q. CHS, with SSI - Metformin currently on hold  History of coronary  artery disease.   No complaints of chest pain.  Continue on aspirin Plavix, nitrates and beta-blockers  Hyponatremia.  Likely related to hypervolemia.  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   Continue on aspirin Plavix  COPD.   No wheezing or shortness of breath at this time.  Continue as needed bronchodilator therapy  Left heel ulcer.   Seen by wound care.  Will likely need referral to wound care center.   Appears to be a chronic wound.  Nutritional status:          Cultures; Blood cultures x2 >>> negative to date  Antimicrobials: IV antibiotics Ancef 09/27/2019 >>>   --------------------------------------------------------------------------------------------------------------------------------  DVT prophylaxis: Lovenox SQ Code Status:   Code Status: Full Code Family Communication: No family member present at bedside The above findings and plan of care has been discussed with patient  in detail,  they expressed understanding and agreement of above. -Advance care planning has been discussed.   Admission status:   Status is: Inpatient  Remains inpatient appropriate because:IV treatments appropriate due to intensity of illness or inability to take PO   Dispo: The patient is from: Home              Anticipated d/c is to:  Home              Anticipated d/c date is: 2 days              Patient currently is not medically stable to d/c.     Consultants: Cardiology, wound care  Procedures:     2D echocardiogram:  Echo 09/28/2019: 1. Left ventricular ejection fraction, by estimation, is 20 to 25%. The  left ventricle has severely decreased function. The left ventricle  demonstrates global hypokinesis. The left ventricular internal cavity size  was severely dilated. There is mild  left ventricular hypertrophy. Indeterminate diastolic filling due to E-A  fusion.  2. Right ventricular systolic function is  moderately reduced. The right  ventricular size is mildly enlarged. There is moderately elevated  pulmonary artery systolic pressure. The estimated right ventricular  systolic pressure is 19.5 mmHg.  3. Left atrial size was mildly dilated.  4. Right atrial size was moderately dilated.  5. The mitral valve is degenerative. Mild mitral valve regurgitation. No  evidence of mitral stenosis.  6. Tricuspid valve regurgitation is mild to moderate.  7. The aortic valve was not well visualized. Aortic valve regurgitation  is not visualized. Mild aortic valve sclerosis is present, with no  evidence of aortic valve stenosis.  8. The inferior vena cava is dilated in size with <50% respiratory  variability, suggesting right atrial pressure of 15 mmHg.    Antimicrobials:  Anti-infectives (From admission, onward)   Start     Dose/Rate Route Frequency Ordered Stop   09/27/19 1000  ceFAZolin (ANCEF) IVPB 1 g/50 mL premix     1 g 100 mL/hr over 30 Minutes Intravenous Every 8 hours 09/27/19 0427     09/27/19 0300  cefTRIAXone (ROCEPHIN) 1 g in sodium chloride 0.9 % 100 mL IVPB     1 g 200 mL/hr over 30 Minutes Intravenous  Once 09/27/19 0257 09/27/19 0406       Medication:  . aspirin EC  81 mg Oral Daily  . atorvastatin  80 mg Oral Daily  . clopidogrel  75 mg Oral Daily  . enoxaparin (LOVENOX) injection  40 mg Subcutaneous Q24H  . ezetimibe  10 mg Oral Daily  . gabapentin  800 mg Oral QID  . insulin aspart  0-15 Units Subcutaneous TID WC  . insulin aspart  0-5 Units Subcutaneous QHS  . isosorbide mononitrate  30 mg Oral Daily  . magnesium oxide  400 mg Oral Daily  . metoprolol succinate  25 mg Oral Daily  . mometasone-formoterol  2 puff Inhalation BID  . multivitamin with minerals  1 tablet Oral Daily  . nicotine  21 mg Transdermal Daily  . potassium chloride  10 mEq Oral Daily  . sacubitril-valsartan  1 tablet Oral BID  . spironolactone  25 mg Oral Daily  . torsemide  40 mg Oral  BID  . traZODone  150 mg Oral QHS    acetaminophen **OR** acetaminophen, calcium carbonate, ipratropium-albuterol, morphine injection, nitroGLYCERIN, ondansetron **OR** ondansetron (ZOFRAN) IV, oxyCODONE   Objective:   Vitals:   09/28/19 1553 09/28/19 2247 09/28/19 2355 09/29/19 0802  BP: 108/77 103/73 110/70 109/81  Pulse: 98 90 91 91  Resp: 17 18 16 18   Temp: 98.1 F (36.7 C)  97.7 F (36.5 C) 98.1 F (36.7 C)  TempSrc: Oral   Oral  SpO2: 96% 99% 92% 91%  Weight:      Height:        Intake/Output Summary (Last 24 hours)  at 09/29/2019 1323 Last data filed at 09/29/2019 0700 Gross per 24 hour  Intake 528.43 ml  Output 1825 ml  Net -1296.57 ml   Filed Weights   09/26/19 2221 09/27/19 0240  Weight: 101.6 kg 99.6 kg     Examination:   Physical Exam  Constitution:  Alert, cooperative, no distress,  Appears calm and comfortable  Psychiatric: Normal and stable mood and affect, cognition intact,   HEENT: Normocephalic, PERRL, otherwise with in Normal limits  Chest:Chest symmetric Cardio vascular:  S1/S2, RRR, No murmure, No Rubs or Gallops  pulmonary: Clear to auscultation bilaterally, respirations unlabored, negative wheezes / crackles Abdomen: Soft, non-tender, non-distended, bowel sounds,no masses, no organomegaly Muscular skeletal:  Right AKA Limited exam - in bed, able to move all 4 extremities, Normal strength,  Neuro: CNII-XII intact. , normal motor and sensation, reflexes intact  Extremities:  Right BKA -stasis in place, LLE +1 pitting edema lower extremities, +2 pulses  Skin: Left lower extremity edema erythema, left heel ulcer Wounds: Left heel ulcer       LABs:  CBC Latest Ref Rng & Units 09/29/2019 09/26/2019 09/22/2019  WBC 4.0 - 10.5 K/uL 4.3 6.4 6.5  Hemoglobin 13.0 - 17.0 g/dL 10.7(L) 11.4(L) 11.7(L)  Hematocrit 39.0 - 52.0 % 33.1(L) 36.2(L) 37.1(L)  Platelets 150 - 400 K/uL 148(L) 188 200   CMP Latest Ref Rng & Units 09/29/2019 09/28/2019 09/27/2019   Glucose 70 - 99 mg/dL 127(H) 106(H) -  BUN 6 - 20 mg/dL 15 13 -  Creatinine 0.61 - 1.24 mg/dL 1.05 1.04 1.00  Sodium 135 - 145 mmol/L 138 137 -  Potassium 3.5 - 5.1 mmol/L 3.7 4.2 -  Chloride 98 - 111 mmol/L 96(L) 95(L) -  CO2 22 - 32 mmol/L 32 34(H) -  Calcium 8.9 - 10.3 mg/dL 8.5(L) 8.9 -  Total Protein 6.5 - 8.1 g/dL - - -  Total Bilirubin 0.3 - 1.2 mg/dL - - -  Alkaline Phos 38 - 126 U/L - - -  AST 15 - 41 U/L - - -  ALT 0 - 44 U/L - - -        SIGNED: Deatra James, MD, FACP, FHM. [facility],  Pager [phone] ([email or web address] to [email or web address])  If 7PM-7AM, please contact night-coverage [email or web address] 09/29/2019, 1:23 PM

## 2019-09-30 ENCOUNTER — Encounter (INDEPENDENT_AMBULATORY_CARE_PROVIDER_SITE_OTHER): Payer: Self-pay

## 2019-09-30 ENCOUNTER — Other Ambulatory Visit: Payer: Self-pay | Admitting: Cardiovascular Disease

## 2019-09-30 ENCOUNTER — Encounter (INDEPENDENT_AMBULATORY_CARE_PROVIDER_SITE_OTHER): Payer: Medicare Other

## 2019-09-30 LAB — GLUCOSE, CAPILLARY
Glucose-Capillary: 87 mg/dL (ref 70–99)
Glucose-Capillary: 128 mg/dL — ABNORMAL HIGH (ref 70–99)

## 2019-09-30 MED ORDER — BACID PO TABS: 2.0000 | ORAL_TABLET | Freq: Three times a day (TID) | ORAL | 0 refills | Status: AC

## 2019-09-30 MED ORDER — METOLAZONE 2.5 MG PO TABS: 2.5000 mg | ORAL_TABLET | ORAL | 1 refills | Status: DC

## 2019-09-30 MED ORDER — ENTRESTO 24-26 MG PO TABS: 1.0000 | ORAL_TABLET | Freq: Two times a day (BID) | ORAL | 2 refills | Status: DC

## 2019-09-30 MED ORDER — CLINDAMYCIN HCL 300 MG PO CAPS: 300.0000 mg | ORAL_CAPSULE | Freq: Three times a day (TID) | ORAL | 0 refills | Status: AC

## 2019-09-30 MED ORDER — NICOTINE 21 MG/24HR TD PT24: 21.0000 mg | MEDICATED_PATCH | Freq: Every day | TRANSDERMAL | 0 refills | Status: DC

## 2019-09-30 MED ORDER — BACID PO TABS
2.0000 | ORAL_TABLET | Freq: Three times a day (TID) | ORAL | Status: DC
  Filled 2019-09-30 (×3): qty 2

## 2019-09-30 MED ORDER — SPIRONOLACTONE 25 MG PO TABS: 25.0000 mg | ORAL_TABLET | Freq: Every day | ORAL | 1 refills | Status: DC

## 2019-09-30 MED ORDER — FUROSEMIDE 10 MG/ML IJ SOLN: 40.0000 mg | Freq: Three times a day (TID) | INTRAMUSCULAR | Status: DC

## 2019-09-30 NOTE — Discharge Summary (Addendum)
Physician Discharge Summary [facility]    Patient: [name] [name]                   Admit date: 09/27/2019   DOB: [birth year]             Discharge date:09/30/2019/3:11 PM [id number]                          PCP: [name] Jacobs, MD  Disposition: Home with home health  Recommendations for Outpatient Follow-up:   . Follow up: in 1 week  Discharge Condition: Stable   Code Status:   Code Status: Full Code  Diet recommendation: Cardiac diet/diabetic diet   Addendum:   Cardiology added new medications such as metolazone 2.5 mg twice a week with torsemide      Discharge Diagnoses:    Principal Problem:   Cellulitis of left lower leg Active Problems:   PAD (peripheral artery disease) (HCC)   Tobacco use   Cellulitis   Chronic systolic CHF (congestive heart failure) ([place])   Type 2 diabetes mellitus with other specified complication ([place])   Hyponatremia   History of Present Illness/ Hospital Course [name] [name] Summary:    59 year old male with a history of chronic systolic congestive heart failure, ejection fraction of 20 to 25%, COPD, diabetes, coronary artery disease, presents to the hospital with increasing left leg pain, redness and swelling.  Found to have cellulitis of left lower extremity admitted for IV antibiotics. He also had some evidence of volume overload and received a dose of intravenous Lasix.  He is continued on his home dose of torsemide now.   Left lower extremity cellulitis.  -Patient remained stable, afebrile normotensive -Left lower extremity erythema edema improving -Continue IV antibiotics--IV Ancef till 09/30/2019 switch to p.o. clindamycin -Venous Doppler negative for DVT  Acute on chronic systolic congestive heart failure.  -According to last echoEjection fraction of 20 to 25%.  Reviewed, -On is received Lasix previously, currently on torsemide -Monitoring I's and O's, daily weight (net - 7.0  since admission) -His home  medication of Entresto, spironolactone, metoprolol and nitrates was resumed -Team was consulted, medication was titrated adjusted accordingly.  Diabetes mellitus type II  -Resuming home insulin regimen including Metformin  History of coronary artery disease.  [name] complaints of chest pain.  Continue on aspirin Plavix, nitrates and beta-blockers  Hyponatremia. Likely related to hypervolemia.  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  Continue on aspirin Plavix  COPD.  [name] wheezing or shortness of breath at this time. Continue as needed bronchodilator therapy  Left heel ulcer.  Seen by wound care. Will likely need referral to wound care center.  Appears to be a chronic wound.  Nutritional status:    Cultures; Blood cultures x2 >>> negative to date  Antimicrobials: IV antibiotics Ancef 09/27/2019 >>>  09/29/2019 09/30/2019 initiated clindamycin 300 mg p.o. 3 times daily x7 more days  ---------------------------------------------------------------------------------------------------------------------   Code Status:   Code Status: Full Code Family Communication: [name] family member present at bedside The above findings and plan of care has been discussed with patient  in detail,  they expressed understanding and agreement of above. -Advance care planning has been discussed.   Admission status:  Status is: Inpatient    Dispo: The patient is from: Home  Anticipated d/c is to: Home with home health today 09/30/2019       Nutritional status:  Discharge Instructions:   Discharge Instructions    (HEART FAILURE PATIENTS) Call MD:  Anytime you have any of the following symptoms: 1) 3 pound weight gain in 24 hours or 5 pounds in 1 week 2) shortness of breath, with or without a dry hacking cough 3) swelling in the hands, feet or stomach 4) if you have to sleep on extra pillows at  night in order to breathe.   Complete by: As directed    Activity as tolerated - [name] restrictions   Complete by: As directed    Call MD for:  persistant nausea and vomiting   Complete by: As directed    Call MD for:  temperature >100.4   Complete by: As directed    Diet - low sodium heart healthy   Complete by: As directed    Discharge instructions   Complete by: As directed    Please follow-up with your PCP and cardiologist (Your medication needs to be titrated and adjust over next 4 to 6 weeks) You need continued wound care for the foot--continue with wound care nurse   Increase activity slowly   Complete by: As directed        Medication List    STOP taking these medications   doxycycline 100 MG capsule Commonly known as: VIBRAMYCIN   promethazine 25 MG tablet Commonly known as: PHENERGAN   sulfamethoxazole-trimethoprim 800-160 MG tablet Commonly known as: BACTRIM DS     TAKE these medications   albuterol 108 (90 Base) MCG/ACT inhaler Commonly known as: VENTOLIN HFA Inhale into the lungs every 6 (six) hours as needed for wheezing or shortness of breath.   aspirin EC 81 MG tablet Take 1 tablet (81 mg total) by mouth daily.   atorvastatin 80 MG tablet Commonly known as: LIPITOR Take 80 mg by mouth daily.   calcium carbonate 500 MG chewable tablet Commonly known as: TUMS - dosed in mg elemental calcium Chew 4 tablets by mouth daily as needed for indigestion or heartburn.   clindamycin 300 MG capsule Commonly known as: CLEOCIN Take 1 capsule (300 mg total) by mouth 3 (three) times daily for 7 days.   clopidogrel 75 MG tablet Commonly known as: PLAVIX Take 75 mg by mouth daily.   collagenase ointment Commonly known as: SANTYL Apply 1 application topically daily.   Entresto 24-26 MG Generic drug: sacubitril-valsartan Take 1 tablet by mouth 2 (two) times daily.   ezetimibe 10 MG tablet Commonly known as: ZETIA Take 1 tablet (10 mg total) by mouth daily.    gabapentin 800 MG tablet Commonly known as: NEURONTIN Take 800 mg by mouth 4 (four) times daily.   insulin glargine 100 UNIT/ML Solostar Pen Commonly known as: Lantus SoloStar Inject 30 Units into the skin at bedtime. What changed:   how much to take  when to take this  additional instructions   insulin lispro 100 UNIT/ML injection Commonly known as: HUMALOG Sliding Scale Insulin before meals using Novolog If BS is 0 to 70 [name] insulin and eat/glucose tablets/glucose get If BS 71 to 150 great! If BS 151 to 200, 2 Units Novolog If BS 201 to 250, 4 Units Novolog If BS 251 to 300, 6 Units Novolog If BS 301 to 350, 8 Units Novolog If BS 351 to 400, 10 Units Novolog If BS greater than 400, take 10 Units and Contact our office during office hour   ipratropium-albuterol 0.5-2.5 (3) MG/3ML Soln Commonly known as: DUONEB Inhale 3 mLs into the lungs every 6 (  six) hours as needed (wheezing/sob).   isosorbide mononitrate 30 MG 24 hr tablet Commonly known as: IMDUR Take 1 tablet (30 mg total) by mouth daily.   lactobacillus acidophilus Tabs tablet Take 2 tablets by mouth 3 (three) times daily for 10 days.   metFORMIN 500 MG 24 hr tablet Commonly known as: GLUCOPHAGE-XR Take 1,000 mg by mouth 2 (two) times daily.   metolazone 2.5 MG tablet Commonly known as: ZAROXOLYN Take 1 tablet (2.5 mg total) by mouth every 3 (three) days.   metoprolol succinate 25 MG 24 hr tablet Commonly known as: TOPROL-XL Take 1 tablet (25 mg total) by mouth daily.   Multi-Vitamins Tabs Take 1 tablet by mouth daily.   nicotine 21 mg/24hr patch Commonly known as: NICODERM CQ - dosed in mg/24 hours Place 1 patch (21 mg total) onto the skin daily. Start taking on: October 01, 2019   nitroGLYCERIN 0.4 MG SL tablet Commonly known as: NITROSTAT Place 1 tablet (0.4 mg total) under the tongue every 5 (five) minutes as needed for chest pain. What changed: Another medication with the same name was removed. Continue  taking this medication, and follow the directions you see here.   Oxycodone HCl 10 MG Tabs Take 10 mg by mouth every 6 (six) hours as needed (pain).   potassium chloride 10 MEQ tablet Commonly known as: KLOR-CON Take 1 tablet (10 mEq total) by mouth daily.   spironolactone 25 MG tablet Commonly known as: ALDACTONE Take 1 tablet (25 mg total) by mouth daily. Start taking on: October 01, 2019   torsemide 20 MG tablet Commonly known as: DEMADEX Take 2 tablets (40 mg total) by mouth 2 (two) times daily. 40mg  AM/ 20mg  PM What changed: additional instructions   traZODone 150 MG tablet Commonly known as: DESYREL Take 150 mg by mouth at bedtime.      Follow-up Information    [name] Jacobs, MD On 10/08/2019.   Specialty: Family Medicine Why: HOSP. FOLLOW @4 :15PM  Contact information: [address] [place] [address] [phone]        Minna Merritts, MD.   Specialty: Cardiology Contact information: [address] [place] [address] [phone]        [facility] On 10/05/2019.   Specialty: Cardiology Why: at 2:30pm. Enter through the [place] entrance Contact information: [place]         Allergies  Allergen Reactions  . Dulaglutide Anaphylaxis, Diarrhea and Hives  . Other Itching    Skin itching associated with nitro patch  . Prednisone Rash     Procedures /Studies:   DG Chest 2 View  Result Date: 09/26/2019 CLINICAL DATA:  Chest pain EXAM: CHEST - 2 VIEW COMPARISON:  09/22/2019 FINDINGS: Cardiomegaly. [name] overt edema or effusions. [name] confluent opacities or acute bony abnormality. IMPRESSION: Cardiomegaly.  [name] active disease. Electronically Signed   By: Rolm Baptise M.D.   On: 09/26/2019 23:00   DG Chest 2 View  Result Date: 09/22/2019 CLINICAL DATA:  Fluid overload, shortness of breath, chest pain  radiating down RIGHT arm, history diabetes mellitus, hypertension, CHF, smoker EXAM: CHEST - 2 VIEW COMPARISON:  09/10/2019 FINDINGS: Enlargement of cardiac silhouette with pulmonary vascular congestion. Atherosclerotic calcification aorta. Slight chronic interstitial prominence likely reflecting minimal chronic failure. [name] acute pulmonary edema or segmental consolidation. [name] pleural effusion or pneumothorax. Bones demineralized. IMPRESSION: Enlargement of cardiac silhouette with  pulmonary vascular congestion and minimal chronic interstitial prominence likely reflecting minimal chronic failure, unchanged. [name] superimposed acute abnormalities. Electronically Signed   By: Lavonia Dana M.D.   On: 09/22/2019 18:04   US Abdomen Limited  Result Date: 09/21/2019 INDICATION: Ascites. EXAM: ULTRASOUND GUIDED  PARACENTESIS MEDICATIONS: None. COMPLICATIONS: None immediate. PROCEDURE: Ultrasound revealed [name] prominent fluid collection. Paracentesis not performed. FINDINGS: See above. IMPRESSION: Ultrasound reveal [name] prominent fluid collection. Paracentesis not performed. Electronically Signed   By: Marcello Moores  Register   On: 09/21/2019 10:30   US Venous Img Lower Unilateral Left (DVT)  Result Date: 09/27/2019 CLINICAL DATA:  Pain and edema left lower extremity EXAM: LEFT LOWER EXTREMITY VENOUS DOPPLER ULTRASOUND TECHNIQUE: Gray-scale sonography with graded compression, as well as color Doppler and duplex ultrasound were performed to evaluate the lower extremity deep venous systems from the level of the common femoral vein and including the common femoral, femoral, profunda femoral, popliteal and calf veins including the posterior tibial, peroneal and gastrocnemius veins when visible. The superficial great saphenous vein was also interrogated. Spectral Doppler was utilized to evaluate flow at rest and with distal augmentation maneuvers in the common femoral, femoral and popliteal veins. COMPARISON:  None. FINDINGS:  Contralateral Common Femoral Vein: Respiratory phasicity is normal and symmetric with the symptomatic side. [name] evidence of thrombus. Normal compressibility. Common Femoral Vein: [name] evidence of thrombus. Normal compressibility, respiratory phasicity and response to augmentation. Saphenofemoral Junction: [name] evidence of thrombus. Normal compressibility and flow on color Doppler imaging. Profunda Femoral Vein: [name] evidence of thrombus. Normal flow on color Doppler imaging. Femoral Vein: [name] evidence of thrombus. Normal respiratory phasicity and response to augmentation. Popliteal Vein: [name] evidence of thrombus. Normal compressibility, respiratory phasicity and response to augmentation. Calf Veins: [name] evidence of thrombus. Normal compressibility and flow on color Doppler imaging. IMPRESSION: [name] significant left lower extremity DVT. Electronically Signed   By: Jerilynn Mages.  Shick M.D.   On: 09/27/2019 11:30   DG Chest Portable 1 View  Result Date: 09/10/2019 CLINICAL DATA:  Left leg pain and swelling EXAM: PORTABLE CHEST 1 VIEW COMPARISON:  08/30/2019 FINDINGS: Cardiomegaly. Both lungs are clear. The visualized skeletal structures are unremarkable. IMPRESSION: Cardiomegaly without acute abnormality of the lungs in AP portable projection. Electronically Signed   By: Eddie Candle M.D.   On: 09/10/2019 20:35   ECHOCARDIOGRAM COMPLETE  Result Date: 09/28/2019    ECHOCARDIOGRAM REPORT   Patient Name:   [name] [name] Date of Exam: 09/28/2019 Medical Rec #:  [id number]      Height:       67.0 in Accession #:    [id number]     Weight:       219.5 lb Date of Birth:  1960-07-15       BSA:          2.104 m Patient Age:    60 years       BP:           103/70 mmHg Patient Gender: M              HR:           103 bpm. Exam Location:  [facility] Procedure: 2D Echo, Cardiac Doppler and Color Doppler Indications:     CHF- acute diastolic 349.17  History:         Patient has prior history of Echocardiogram examinations, most                  recent  11/06/2018. COPD, Signs/Symptoms:Syncope; Risk  Factors:Diabetes. Combined diastolic and systolic CHF.  Sonographer:     Sherrie Sport RDCS (AE) Referring Phys:  [id number] Athena Masse Diagnosing Phys: Nelva Bush MD  Sonographer Comments: Suboptimal apical window. IMPRESSIONS  1. Left ventricular ejection fraction, by estimation, is 20 to 25%. The left ventricle has severely decreased function. The left ventricle demonstrates global hypokinesis. The left ventricular internal cavity size was severely dilated. There is mild left ventricular hypertrophy. Indeterminate diastolic filling due to E-A fusion.  2. Right ventricular systolic function is moderately reduced. The right ventricular size is mildly enlarged. There is moderately elevated pulmonary artery systolic pressure. The estimated right ventricular systolic pressure is 99.2 mmHg.  3. Left atrial size was mildly dilated.  4. Right atrial size was moderately dilated.  5. The mitral valve is degenerative. Mild mitral valve regurgitation. [name] evidence of mitral stenosis.  6. Tricuspid valve regurgitation is mild to moderate.  7. The aortic valve was not well visualized. Aortic valve regurgitation is not visualized. Mild aortic valve sclerosis is present, with [name] evidence of aortic valve stenosis.  8. The inferior vena cava is dilated in size with <50% respiratory variability, suggesting right atrial pressure of 15 mmHg. FINDINGS  Left Ventricle: Left ventricular ejection fraction, by estimation, is 20 to 25%. The left ventricle has severely decreased function. The left ventricle demonstrates global hypokinesis. The left ventricular internal cavity size was severely dilated. There is mild left ventricular hypertrophy. Indeterminate diastolic filling due to E-A fusion. Right Ventricle: The right ventricular size is mildly enlarged. [name] increase in right ventricular wall thickness. Right ventricular systolic function is moderately reduced. There is  moderately elevated pulmonary artery systolic pressure. The tricuspid regurgitant velocity is 3.07 m/s, and with an assumed right atrial pressure of 15 mmHg, the estimated right ventricular systolic pressure is 42.6 mmHg. Left Atrium: Left atrial size was mildly dilated. Right Atrium: Right atrial size was moderately dilated. Pericardium: Trivial pericardial effusion is present. Mitral Valve: The mitral valve is degenerative in appearance. Mild mitral annular calcification. Mild mitral valve regurgitation. [name] evidence of mitral valve stenosis. Tricuspid Valve: The tricuspid valve is normal in structure. Tricuspid valve regurgitation is mild to moderate. Aortic Valve: The aortic valve was not well visualized. . There is mild thickening of the aortic valve. Aortic valve regurgitation is not visualized. Mild aortic valve sclerosis is present, with [name] evidence of aortic valve stenosis. There is mild thickening of the aortic valve. Aortic valve mean gradient measures 4.3 mmHg. Aortic valve peak gradient measures 7.4 mmHg. Aortic valve area, by VTI measures 1.15 cm. Pulmonic Valve: The pulmonic valve was grossly normal. Pulmonic valve regurgitation is not visualized. [name] evidence of pulmonic stenosis. Aorta: The aortic root is normal in size and structure. Pulmonary Artery: The pulmonary artery is not well seen. Venous: The inferior vena cava is dilated in size with less than 50% respiratory variability, suggesting right atrial pressure of 15 mmHg. IAS/Shunts: The interatrial septum was not well visualized. Additional Comments: There is pleural effusion in the left lateral region.  LEFT VENTRICLE PLAX 2D LVIDd:         6.69 cm      Diastology LVIDs:         6.24 cm      LV e' lateral:   10.90 cm/s LV PW:         1.13 cm      LV E/e' lateral: 7.6 LV IVS:        0.90 cm  LV e' medial:    82.90 cm/s LVOT diam:     2.00 cm      LV E/e' medial:  1.0 LV SV:         23 LV SV Index:   11 LVOT Area:     3.14 cm  LV Volumes  (MOD) LV vol d, MOD A4C: 260.0 ml LV vol s, MOD A4C: 203.0 ml LV SV MOD A4C:     260.0 ml RIGHT VENTRICLE RV Basal diam:  4.39 cm RV S prime:     8.16 cm/s TAPSE (M-mode): 1.0 cm LEFT ATRIUM            Index       RIGHT ATRIUM           Index LA diam:      4.80 cm  2.28 cm/m  RA Area:     32.90 cm LA Vol (A4C): 102.0 ml 48.48 ml/m RA Volume:   128.00 ml 60.84 ml/m  AORTIC VALVE                   PULMONIC VALVE AV Area (Vmax):    1.19 cm    PV Vmax:        0.50 m/s AV Area (Vmean):   0.99 cm    PV Peak grad:   1.0 mmHg AV Area (VTI):     1.15 cm    RVOT Peak grad: 1 mmHg AV Vmax:           136.00 cm/s AV Vmean:          95.000 cm/s AV VTI:            0.199 m AV Peak Grad:      7.4 mmHg AV Mean Grad:      4.3 mmHg LVOT Vmax:         51.70 cm/s LVOT Vmean:        29.900 cm/s LVOT VTI:          0.073 m LVOT/AV VTI ratio: 0.37  AORTA Ao Root diam: 3.20 cm MITRAL VALVE                TRICUSPID VALVE MV Area (PHT): 5.54 cm     TR Peak grad:   37.7 mmHg MV Decel Time: 137 msec     TR Vmax:        307.00 cm/s MV E velocity: 82.90 cm/s MV A velocity: 111.00 cm/s  SHUNTS MV E/A ratio:  0.75         Systemic VTI:  0.07 m                             Systemic Diam: 2.00 cm Nelva Bush MD Electronically signed by Nelva Bush MD Signature Date/Time: 09/28/2019/4:32:53 PM    Final    VAS [place] LOWER EXTREMITY ARTERIAL DUPLEX  Result Date: 09/14/2019 LOWER EXTREMITY ARTERIAL DUPLEX STUDY Indications: Ulceration.  Vascular Interventions: 08/24/2018:Aortagram and Selective Left Lower Extremity                         Angiogram.                         PTA's of the Left Tib/Peroneal Trunk and Posterior  tibial Artery.                         PTA of the Left SFAand Above knee Popliteal.                         Stent placement to the Left SFA.                         08/02/2019 left SFA to TP trunk PTA and stent. Current ABI:            .95 from 08/11/2018 Performing Technologist: Concha Norway RVT   Examination Guidelines: A complete evaluation includes B-mode imaging, spectral Doppler, color Doppler, and power Doppler as needed of all accessible portions of each vessel. Bilateral testing is considered an integral part of a complete examination. Limited examinations for reoccurring indications may be performed as noted.  +----------+--------+-----+--------+----------+--------+ LEFT      PSV cm/sRatioStenosisWaveform  Comments +----------+--------+-----+--------+----------+--------+ EIA [id number]                  monophasic         +----------+--------+-----+--------+----------+--------+ CFA Mid   256                  monophasic         +----------+--------+-----+--------+----------+--------+ DFA       87                   monophasic         +----------+--------+-----+--------+----------+--------+ SFA Prox  153                  monophasic         +----------+--------+-----+--------+----------+--------+ SFA Mid   58                   monophasic         +----------+--------+-----+--------+----------+--------+ SFA Distal56                   monophasicstent    +----------+--------+-----+--------+----------+--------+ POP Prox                                 stent    +----------+--------+-----+--------+----------+--------+ POP Distal78                   monophasicstent    +----------+--------+-----+--------+----------+--------+ ATA Distal16                   monophasic         +----------+--------+-----+--------+----------+--------+ PTA Distal43                   monophasic         +----------+--------+-----+--------+----------+--------+  Summary: Left: Patent Left LE arterial system from EIA to distal PTA/ATA. New SFA to TP trunk stent is patent with [name] evidence of stenosis. Spot check of lower extremity venous system shows [name] evidence of DVT. Lymph nodes in groin show multiple enlarged nodes.  See table(s) above for measurements and  observations. Electronically signed by Leotis Pain MD on 09/14/2019 at 5:11:51 PM.    Final     Subjective:   Patient was seen and examined 09/30/2019, 3:11 PM Patient stable today. [name] acute distress.  [name] issues overnight Stable for discharge.  Discharge Exam:    Vitals:   09/30/19 0046 09/30/19 0141 09/30/19  7672 09/30/19 0750  BP: 95/68 98/78  104/74  Pulse: 91 90  91  Resp: 18   16  Temp: (!) 97.5 F (36.4 C) 97.9 F (36.6 C)  97.7 F (36.5 C)  TempSrc: Oral Oral  Axillary  SpO2: 98% 94%  95%  Weight:   96.2 kg   Height:        General: Pt lying comfortably in bed & appears in [name] obvious distress. Cardiovascular: S1 & S2 heard, RRR, S1/S2 +. [name] murmurs, rubs, gallops or clicks. [name] JVD or pedal edema. Respiratory: Clear to auscultation without wheezing, rhonchi or crackles. [name] increased work of breathing. Abdominal:  Non-distended, non-tender & soft. [name] organomegaly or masses appreciated. Normal bowel sounds heard. CNS: Alert and oriented. [name] focal deficits. Extremities: [name] edema, [name] cyanosis    The results of significant diagnostics from this hospitalization (including imaging, microbiology, ancillary and laboratory) are listed below for reference.      Microbiology:   Recent Results (from the past 240 hour(s))  SARS CORONAVIRUS 2 (TAT 6-24 HRS) Nasopharyngeal Nasopharyngeal Swab     Status: None   Collection Time: 09/27/19  8:23 AM   Specimen: Nasopharyngeal Swab  Result Value Ref Range Status   SARS Coronavirus 2 NEGATIVE NEGATIVE Final    Comment: (NOTE) SARS-CoV-2 target nucleic acids are NOT DETECTED. The SARS-CoV-2 RNA is generally detectable in upper and lower respiratory specimens during the acute phase of infection. Negative results do not preclude SARS-CoV-2 infection, do not rule out co-infections with other pathogens, and should not be used as the sole basis for treatment or other patient management decisions. Negative results must be combined with  clinical observations, patient history, and epidemiological information. The expected result is Negative. Fact Sheet for Patients: SugarRoll.be Fact Sheet for Healthcare Providers: https://www.woods-mathews.com/ This test is not yet approved or cleared by the [place] FDA and  has been authorized for detection and/or diagnosis of SARS-CoV-2 by FDA under an Emergency Use Authorization (EUA). This EUA will remain  in effect (meaning this test can be used) for the duration of the COVID-19 declaration under Section 56 4(b)(1) of the Act, 21 U.S.C. section 360bbb-3(b)(1), unless the authorization is terminated or revoked sooner. Performed at [facility], [place] [address], [place], [place] [address]      Labs:   CBC: Recent Labs  Lab 09/26/19 2224 09/29/19 0402  WBC 6.4 4.3  HGB 11.4* 10.7*  HCT 36.2* 33.1*  MCV 72.0* 70.6*  PLT 188 962*   Basic Metabolic Panel: Recent Labs  Lab 09/26/19 2224 09/27/19 0823 09/28/19 1617 09/29/19 0402  NA 128*  --  137 138  K 3.7  --  4.2 3.7  CL 88*  --  95* 96*  CO2 29  --  34* 32  GLUCOSE 151*  --  106* 127*  BUN 15  --  13 15  CREATININE 1.15 1.00 1.04 1.05  CALCIUM 8.6*  --  8.9 8.5*  MG  --   --  1.7  --    Liver Function Tests: [name] results for input(s): AST, ALT, ALKPHOS, BILITOT, PROT, ALBUMIN in the last 168 hours. BNP (last 3 results) Recent Labs    09/20/19 1220 09/22/19 1708 09/26/19 2224  BNP 1,431.0* 2,365.0* 1,342.0*   Cardiac Enzymes: [name] results for input(s): CKTOTAL, CKMB, CKMBINDEX, TROPONINI in the last 168 hours. CBG: Recent Labs  Lab 09/29/19 1145 09/29/19 1646 09/29/19 2019 09/30/19 0751 09/30/19 1202  GLUCAP 133* 113* 114* 87 128*  Hgb A1c [name] results for input(s): HGBA1C in the last 72 hours. Lipid Profile [name] results for input(s): CHOL, HDL, LDLCALC, TRIG, CHOLHDL, LDLDIRECT in the last 72 hours. Thyroid function studies [name] results for  input(s): TSH, T4TOTAL, T3FREE, THYROIDAB in the last 72 hours.  Invalid input(s): FREET3 Anemia work up [name] results for input(s): VITAMINB12, FOLATE, FERRITIN, TIBC, IRON, RETICCTPCT in the last 72 hours. Urinalysis    Component Value Date/Time   COLORURINE STRAW (A) 09/10/2019 2028   APPEARANCEUR CLEAR (A) 09/10/2019 2028   APPEARANCEUR Clear 02/06/2014 1412   LABSPEC 1.003 (L) 09/10/2019 2028   LABSPEC 1.015 02/06/2014 1412   PHURINE 6.0 09/10/2019 2028   GLUCOSEU NEGATIVE 09/10/2019 2028   GLUCOSEU Negative 02/06/2014 1412   HGBUR NEGATIVE 09/10/2019 2028   BILIRUBINUR NEGATIVE 09/10/2019 2028   BILIRUBINUR Negative 02/06/2014 [place] 09/10/2019 2028   PROTEINUR NEGATIVE 09/10/2019 2028   NITRITE NEGATIVE 09/10/2019 2028   LEUKOCYTESUR NEGATIVE 09/10/2019 2028   LEUKOCYTESUR Negative 02/06/2014 1412         Time coordinating discharge: Over 45 minutes  SIGNED: Deatra James, MD, FACP, FHM. [facility],  Pager [phone]  If 7PM-7AM, please contact night-coverage [email or web address] 09/30/2019, 3:11 PM

## 2019-09-30 NOTE — TOC Transition Note (Signed)
Transition of Care [facility]) - CM/SW Discharge Note   Patient Details  Name: [name] [name] MRN: [id number] Date of Birth: 1961/04/05  Transition of Care [facility] CM/SW Contact:  Elease Hashimoto, LCSW Phone Number: 09/30/2019, 9:14 AM   Clinical Narrative: Pt was active with [facility] prior to admission with [facility]. Have also added HHPT. Pt has all of his equipment from previous admissions. He lives with girlfriend who can assist him at home. Pt feels prepared to go home. no further follow due to DC today.     Final next level of care: Home w Home Health Services Barriers to Discharge: Barriers Resolved   Patient Goals and CMS Choice Patient states their goals for this hospitalization and ongoing recovery are:: I hope to save my foot, it is my only leg left      Discharge Placement                Patient to be transferred to facility by: Girlfriend via car Name of family member notified: elizabeth Patient and family notified of of transfer: 09/30/19  Discharge Plan and Services In-house Referral: Clinical Social Work   Post Acute Care Choice: Home Health                    HH Arranged: PT, RN [facility] Agency: [facility] Date [place]: 09/30/19 Time [place]: [phone] Representative spoke with at [place]: [place] ([place]) Interventions     Readmission Risk Interventions Readmission Risk Prevention Plan 11/09/2018 11/08/2018  Transportation Screening - Complete  HRI or [place] - Complete  Social Work Consult for [place] Planning/Counseling Not Complete -  SW consult not completed comments na -  Palliative Care Screening Not Applicable -  Medication Review Press photographer) - Complete  Some recent data might be hidden

## 2019-10-02 NOTE — Progress Notes (Signed)
Date:  10/04/2019   ID:  [name] [name], DOB 01-16-1961, MRN [id number]  Patient Location:  [place] [address]   Provider location:   [facility] [place] office  PCP:  [name], MD  Cardiologist:  Arvid Right [facility]   Chief Complaint  Patient presents with  . office visit    Hospital F/U; Patient unable to review meds-does not know names of his medications.    History of Present Illness:    [name] [name] is a 59 y.o. 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 and lower extremity ulceration,  PAD s/p prior SFA stenting 03/2013 s/p R BKA,  carotid arterial disease Who presents for follow-up of his acute on chronic heart failure , hospital discharge  In the hospital; cellulitis Treated with broad-spectrum antibiotics  Was discharged by hospitalist service with flank edema, leg edema, woody We recommended he start on metolazone 2.5 in the AM 2 times a week And torsemide 40 BID On today's visit he reports he has been taking metolazone daily for the past 3 to 4 days Urinating much more, feels like metolazone is really helping  Still with flank edema, leg edema woody Somewhat better but still well above his baseline Weight today 210 It was 222 in hospital Feels his weight is supposed to be in the 190s Only taking potassium 10 mill equivalents daily Potassium was 3.7 when he left the hospital  Feels the redness in his leg is stable   Presents in wheelchair today  Seen by vascular for foot ulcer, reports he took bactrim started 08/11/19, Ulcer slowly healing  Still smoking,1- 2 ppds, no desire to quit chantix does not work Girlfriend smokes  EKG personally  reviewed by myself on todays visit Sinus tachycardia rate 106 bpm right bundle branch block left anterior fascicular block  Other past medical history reviewed Presentation to the hospital May 2020 acute on chronic systolic CHF Treated with Lasix IV, transition to torsemide Toprol XL and spironolactone   with continued -ACEi has previously been held secondary to relative hypotension  LE stent placed 08/2018 Percutaneous transluminal angioplasty of the entire left SFA and above-knee popliteal artery with a 5 mm diameter by 30 cm length and a 5 mm diameter by 22 cm length Lutonix drug-coated angioplasty balloon  Prior CV studies:   The following studies were reviewed today:  Cardiac cath: 04/2017 1.  Significant three-vessel coronary artery disease with patent stent in the RCA and LAD.  There is significant proximal RCA disease before the stent as well as diffuse mid and distal disease in a vessel that appears to be about 2.5 mm in diameter.  There is significant bifurcation stenosis in the proximal left circumflex with a large OM1.  The LAD has moderate disease. The coronary arteries are moderately calcified and diffusely diseased throughout. 2.  Left ventricular angiography was not performed.  EF was moderately to severely reduced by echo. 3.  Severely elevated left ventricular end-diastolic pressure at 34 mmHg.   2D Echo 11/06/2018: 1. The left ventricle has severely reduced systolic function, with an ejection fraction of 20-25%. The cavity size was moderately dilated. Indeterminate diastolic filling due  to E-A fusion. Left ventricular diffuse hypokinesis. 2. The right ventricle has severely reduced systolic function. The cavity was mildly enlarged. There is no increase in right ventricular wall thickness. Right ventricular systolic pressure is moderately elevated with an estimated pressure of 59.6 mmHg. 3. Left atrial size was mildly dilated. 4. Right atrial size was mildly dilated. 5.  The mitral valve is degenerative. Mild thickening of the mitral valve leaflet. Mild calcification of the mitral valve leaflet. There is mild mitral annular calcification present. 6. The tricuspid valve is degenerative. Tricuspid valve regurgitation is moderate. 7. The aortic valve was not well visualized. Moderate thickening of the aortic valve. Moderate calcification of the aortic valve. 8. The inferior vena cava was dilated in size with <50% respiratory variability.  Past Medical History:  Diagnosis Date  . Arthritis   . Asthma   . Atherosclerosis   . BPH (benign prostatic hyperplasia)   . Carotid arterial disease ([place])   . Charcot's joint of foot, right   . Chronic combined systolic (congestive) and diastolic (congestive) heart failure ([place])    a. 02/2013 EF 50% by LV gram; b. 04/2017 Echo: EF 25-30%. diff HK. Gr2 DD. Mod dil LA/RV. PASP 25mmHg.  [name] COPD (chronic obstructive pulmonary disease) ([place])   . Coronary artery disease    a. 2013 S/P PCI of LAD [facility]);  b. 03/2013 PCI: RCA 90p ( 2.5 x 23 mm DES); c. 02/2014 Cath: patent RCA stent->Med Rx; d. 04/2017 Cath: LM nl, LAd 30ost/p, 65m/d, D1 80ost, LCX 90p/m, OM1 85, RCA 70/20p, 70m, 70d->Referred for CT Surg-felt to be poor candidate.  . Diabetic neuropathy ([place])   . Diabetic ulcer of right foot ([place])   . Hernia   . Hyperlipidemia   . Hypertension   . Ischemic cardiomyopathy    a. 04/2017 Echo: EF 25-30%; b. 08/2017 Cardiac MRI ([facility]): EF 19%, sev glob HK. RVEF 20%, mod BAE, triv MR, mild-mod TR. Basal lateral subendocardial infarct (viable), inf/infsept, dist septal ischemia, basal to mid lat peri-infarct ischemia.  . Morbid obesity ([place])   . Neuropathy   . PAD (peripheral artery disease) ([place])    a. Followed by Dr. Lucky Cowboy; b. 08/2010 Periph Angio: RSFA 70-80p (6X60 self-expanding stent); c. 09/2010 Periph Angio: L SFA 100p (7x unknown length self-expanding stent); d. 06/2015 Periph Angio: R SFA short segment occlusion (6x12 self-expanding  stent).  . Restless leg syndrome   . Sleep apnea   . Subclavian artery stenosis, right ([place])   . Syncope and collapse   . Tobacco use    a. 75+ yr hx - still smoking 1ppd, down from 2 ppd.  . Type II diabetes mellitus ([place])   . Varicose vein    Past Surgical History:  Procedure Laterality Date  . ABDOMINAL AORTAGRAM N/A 06/23/2013   Procedure: ABDOMINAL Maxcine Ham;  Surgeon: Wellington Hampshire, MD;  Location: [place] CATH LAB;  Service: Cardiovascular;  Laterality: N/A;  . AMPUTATION Right 04/01/2018   Procedure: AMPUTATION BELOW KNEE;  Surgeon: Algernon Huxley, MD;  Location: [facility] ORS;  Service: Vascular;  Laterality: Right;  . APPENDECTOMY    . CARDIAC CATHETERIZATION  10/14   [facility]; X1 STENT PROXIMAL RCA  . CARDIAC CATHETERIZATION  02/2010   [facility]  . CARDIAC CATHETERIZATION  02/21/2014   [facility]  . CLAVICLE SURGERY    . HERNIA REPAIR    . IRRIGATION AND DEBRIDEMENT FOOT Right 01/04/2018   Procedure: IRRIGATION AND DEBRIDEMENT FOOT;  Surgeon: Samara Deist, DPM;  Location: [facility] ORS;  Service: Podiatry;  Laterality: Right;  . LEFT HEART CATH AND CORS/GRAFTS ANGIOGRAPHY N/A 04/28/2017   Procedure: LEFT HEART CATH AND CORONARY ANGIOGRAPHY;  Surgeon: Wellington Hampshire, MD;  Location: [place] CV LAB;  Service: Cardiovascular;  Laterality: N/A;  . LOWER EXTREMITY ANGIOGRAPHY Right 03/03/2018   Procedure: LOWER EXTREMITY ANGIOGRAPHY;  Surgeon: Katha Cabal, MD;  Location: [place] CV LAB;  Service: Cardiovascular;  Laterality: Right;  . LOWER EXTREMITY ANGIOGRAPHY Left 08/24/2018   Procedure: LOWER EXTREMITY ANGIOGRAPHY;  Surgeon: Algernon Huxley, MD;  Location: [place] CV LAB;  Service: Cardiovascular;  Laterality: Left;  . LOWER EXTREMITY ANGIOGRAPHY Left 06/14/2019   Procedure: LOWER EXTREMITY ANGIOGRAPHY;  Surgeon: Algernon Huxley, MD;  Location: [place] CV LAB;  Service: Cardiovascular;  Laterality: Left;  . LOWER EXTREMITY ANGIOGRAPHY Left 08/02/2019   Procedure: LOWER EXTREMITY  ANGIOGRAPHY;  Surgeon: Algernon Huxley, MD;  Location: [place] CV LAB;  Service: Cardiovascular;  Laterality: Left;  . PERIPHERAL ARTERIAL STENT GRAFT     x2 left/right  . PERIPHERAL VASCULAR BALLOON ANGIOPLASTY Right 01/06/2018   Procedure: PERIPHERAL VASCULAR BALLOON ANGIOPLASTY;  Surgeon: Katha Cabal, MD;  Location: [place] CV LAB;  Service: Cardiovascular;  Laterality: Right;  . PERIPHERAL VASCULAR CATHETERIZATION N/A 07/06/2015   Procedure: Abdominal Aortogram w/Lower Extremity;  Surgeon: Algernon Huxley, MD;  Location: [place] CV LAB;  Service: Cardiovascular;  Laterality: N/A;  . PERIPHERAL VASCULAR CATHETERIZATION  07/06/2015   Procedure: Lower Extremity Intervention;  Surgeon: Algernon Huxley, MD;  Location: [place] CV LAB;  Service: Cardiovascular;;     Current Meds  Medication Sig  . albuterol (VENTOLIN HFA) 108 (90 Base) MCG/ACT inhaler Inhale into the lungs every 6 (six) hours as needed for wheezing or shortness of breath.  [name] aspirin EC 81 MG tablet Take 1 tablet (81 mg total) by mouth daily.  [name] atorvastatin (LIPITOR) 80 MG tablet Take 80 mg by mouth daily.  . calcium carbonate (TUMS - DOSED IN MG ELEMENTAL CALCIUM) 500 MG chewable tablet Chew 4 tablets by mouth daily as needed for indigestion or heartburn.  . clindamycin (CLEOCIN) 300 MG capsule Take 1 capsule (300 mg total) by mouth 3 (three) times daily for 7 days.  . clopidogrel (PLAVIX) 75 MG tablet Take 75 mg by mouth daily.   . collagenase (SANTYL) ointment Apply 1 application topically daily.  [name] ezetimibe (ZETIA) 10 MG tablet Take 1 tablet (10 mg total) by mouth daily.  . Insulin Glargine (LANTUS SOLOSTAR) 100 UNIT/ML Solostar Pen Inject 30 Units into the skin at bedtime. (Patient taking differently: Inject 20-40 Units into the skin 2 (two) times daily. 40 units in the morning and 20 units at night)  . insulin lispro (HUMALOG) 100 UNIT/ML injection Sliding Scale Insulin before meals using Novolog If BS is 0  to 70 no insulin and eat/glucose tablets/glucose get If BS 71 to 150 great! If BS 151 to 200, 2 Units Novolog If BS 201 to 250, 4 Units Novolog If BS 251 to 300, 6 Units Novolog If BS 301 to 350, 8 Units Novolog If BS 351 to 400, 10 Units Novolog If BS greater than 400, take 10 Units and Contact our office during office hour  . ipratropium-albuterol (DUONEB) 0.5-2.5 (3) MG/3ML SOLN Inhale 3 mLs into the lungs every 6 (six) hours as needed (wheezing/sob).   . isosorbide mononitrate (IMDUR) 30 MG 24 hr tablet Take 1 tablet (30 mg total) by mouth daily.  [name] lactobacillus acidophilus (  BACID) TABS tablet Take 2 tablets by mouth 3 (three) times daily for 10 days.  . metFORMIN (GLUCOPHAGE-XR) 500 MG 24 hr tablet Take 1,000 mg by mouth 2 (two) times daily.   . metolazone (ZAROXOLYN) 2.5 MG tablet Take 1 tablet (2.5 mg total) by mouth every 3 (three) days.  . metoprolol succinate (TOPROL-XL) 25 MG 24 hr tablet Take 1 tablet (25 mg total) by mouth daily.  . Multiple Vitamin (MULTI-VITAMINS) TABS Take 1 tablet by mouth daily.   . nicotine (NICODERM CQ - DOSED IN MG/24 HOURS) 21 mg/24hr patch Place 1 patch (21 mg total) onto the skin daily.  . nitroGLYCERIN (NITROSTAT) 0.4 MG SL tablet Place 1 tablet (0.4 mg total) under the tongue every 5 (five) minutes as needed for chest pain.  . Oxycodone HCl 10 MG TABS Take 10 mg by mouth every 6 (six) hours as needed (pain).   . potassium chloride (KLOR-CON) 10 MEQ tablet Take 1 tablet (10 mEq total) by mouth daily.  . sacubitril-valsartan (ENTRESTO) 24-26 MG Take 1 tablet by mouth 2 (two) times daily.  [name] spironolactone (ALDACTONE) 25 MG tablet Take 1 tablet (25 mg total) by mouth daily.  [name] torsemide (DEMADEX) 20 MG tablet Take 2 tablets (40 mg total) by mouth 2 (two) times daily. 40mg  AM/ 20mg  PM (Patient taking differently: Take 40 mg by mouth 2 (two) times daily. )  . traZODone (DESYREL) 150 MG tablet Take 150 mg by mouth at bedtime.     Allergies:   Dulaglutide,  Other, and Prednisone   Social History   Tobacco Use  . Smoking status: Current Every Day Smoker    Packs/day: 1.00    Years: 41.00    Pack years: 41.00    Types: Cigarettes  . Smokeless tobacco: Never Used  Substance Use Topics  . Alcohol use: No  . Drug use: No     Current Outpatient Medications on File Prior to Visit  Medication Sig Dispense Refill  . albuterol (VENTOLIN HFA) 108 (90 Base) MCG/ACT inhaler Inhale into the lungs every 6 (six) hours as needed for wheezing or shortness of breath.    [name] aspirin EC 81 MG tablet Take 1 tablet (81 mg total) by mouth daily. 90 tablet 3  . atorvastatin (LIPITOR) 80 MG tablet Take 80 mg by mouth daily.    . calcium carbonate (TUMS - DOSED IN MG ELEMENTAL CALCIUM) 500 MG chewable tablet Chew 4 tablets by mouth daily as needed for indigestion or heartburn.    . clindamycin (CLEOCIN) 300 MG capsule Take 1 capsule (300 mg total) by mouth 3 (three) times daily for 7 days. 21 capsule 0  . clopidogrel (PLAVIX) 75 MG tablet Take 75 mg by mouth daily.     . collagenase (SANTYL) ointment Apply 1 application topically daily. 90 g 1  . ezetimibe (ZETIA) 10 MG tablet Take 1 tablet (10 mg total) by mouth daily. 30 tablet 5  . Insulin Glargine (LANTUS SOLOSTAR) 100 UNIT/ML Solostar Pen Inject 30 Units into the skin at bedtime. (Patient taking differently: Inject 20-40 Units into the skin 2 (two) times daily. 40 units in the morning and 20 units at night) 15 mL 11  . insulin lispro (HUMALOG) 100 UNIT/ML injection Sliding Scale Insulin before meals using Novolog If BS is 0 to 70 no insulin and eat/glucose tablets/glucose get If BS 71 to 150 great! If BS 151 to 200, 2 Units Novolog If BS 201 to 250, 4 Units Novolog If BS 251 to 300,  6 Units Novolog If BS 301 to 350, 8 Units Novolog If BS 351 to 400, 10 Units Novolog If BS greater than 400, take 10 Units and Contact our office during office hour    . ipratropium-albuterol (DUONEB) 0.5-2.5 (3) MG/3ML SOLN Inhale 3  mLs into the lungs every 6 (six) hours as needed (wheezing/sob).     . isosorbide mononitrate (IMDUR) 30 MG 24 hr tablet Take 1 tablet (30 mg total) by mouth daily. 30 tablet 5  . lactobacillus acidophilus (BACID) TABS tablet Take 2 tablets by mouth 3 (three) times daily for 10 days. 60 tablet 0  . metFORMIN (GLUCOPHAGE-XR) 500 MG 24 hr tablet Take 1,000 mg by mouth 2 (two) times daily.     . metolazone (ZAROXOLYN) 2.5 MG tablet Take 1 tablet (2.5 mg total) by mouth every 3 (three) days. 20 tablet 1  . metoprolol succinate (TOPROL-XL) 25 MG 24 hr tablet Take 1 tablet (25 mg total) by mouth daily. 15 tablet 5  . Multiple Vitamin (MULTI-VITAMINS) TABS Take 1 tablet by mouth daily.     . nicotine (NICODERM CQ - DOSED IN MG/24 HOURS) 21 mg/24hr patch Place 1 patch (21 mg total) onto the skin daily. 28 patch 0  . nitroGLYCERIN (NITROSTAT) 0.4 MG SL tablet Place 1 tablet (0.4 mg total) under the tongue every 5 (five) minutes as needed for chest pain. 30 tablet 5  . Oxycodone HCl 10 MG TABS Take 10 mg by mouth every 6 (six) hours as needed (pain).     . potassium chloride (KLOR-CON) 10 MEQ tablet Take 1 tablet (10 mEq total) by mouth daily. 30 tablet 5  . sacubitril-valsartan (ENTRESTO) 24-26 MG Take 1 tablet by mouth 2 (two) times daily. 60 tablet 2  . spironolactone (ALDACTONE) 25 MG tablet Take 1 tablet (25 mg total) by mouth daily. 30 tablet 1  . torsemide (DEMADEX) 20 MG tablet Take 2 tablets (40 mg total) by mouth 2 (two) times daily. 40mg  AM/ 20mg  PM (Patient taking differently: Take 40 mg by mouth 2 (two) times daily. ) 180 tablet 3  . traZODone (DESYREL) 150 MG tablet Take 150 mg by mouth at bedtime.    . gabapentin (NEURONTIN) 800 MG tablet Take 800 mg by mouth 4 (four) times daily.      No current facility-administered medications on file prior to visit.     Family Hx: The patient's family history includes Heart attack in his father; Heart disease in his father; Hyperlipidemia in his father  and mother; Hypertension in his father and mother.  ROS:   Please see the history of present illness.    Review of Systems  Constitutional: Negative.   HENT: Negative.   Respiratory: Negative.   Cardiovascular: Positive for leg swelling.  Gastrointestinal: Negative.   Musculoskeletal: Negative.   Neurological: Negative.   Psychiatric/Behavioral: Negative.   All other systems reviewed and are negative.    Labs/Other Tests and Data Reviewed:    Recent Labs: 09/17/2019: ALT 10 09/26/2019: B Natriuretic Peptide 1,342.0 09/28/2019: Magnesium 1.7 09/29/2019: BUN 15; Creatinine, Ser 1.05; Hemoglobin 10.7; Platelets 148; Potassium 3.7; Sodium 138   Recent Lipid Panel Lab Results  Component Value Date/Time   CHOL 80 11/09/2018 03:08 AM   CHOL 211 (H) 04/05/2013 05:20 AM   TRIG 42 11/09/2018 03:08 AM   TRIG 321 (H) 04/05/2013 05:20 AM   HDL 26 (L) 11/09/2018 03:08 AM   HDL 34 (L) 04/05/2013 05:20 AM   CHOLHDL 3.1 11/09/2018 03:08 AM  LDLCALC 46 11/09/2018 03:08 AM   LDLCALC 113 (H) 04/05/2013 05:20 AM    Wt Readings from Last 3 Encounters:  10/04/19 210 lb 8 oz (95.5 kg)  09/30/19 212 lb 1.3 oz (96.2 kg)  09/22/19 222 lb 10.6 oz (101 kg)     Exam:    Vital Signs: Vital signs may also be detailed in the HPI BP 120/80 (BP Location: Right Arm, Patient Position: Sitting, Cuff Size: Normal)   Pulse (!) 106   Ht 5\' 7"  (1.702 m)   Wt 210 lb 8 oz (95.5 kg)   SpO2 94%   BMI 32.97 kg/m   Well nourished, well developed male in no acute distress. Constitutional:  oriented to person, place, and time. No distress.  Head: Normocephalic and atraumatic.  Eyes:  no discharge. No scleral icterus.  Neck: Normal range of motion. Neck supple.  Pulmonary/Chest: No audible wheezing, no distress, appears comfortable Rales at the bases Cardiovascular, regular rate and rhythm, tachycardic, no murmurs appreciated, 1-2+ pitting lower extremity edema Woody, flank edema Musculoskeletal: Normal  range of motion.  no  tenderness or deformity.  Neurological:   Coordination normal. Full exam not performed Skin:  Bright red erythema left LE Psychiatric:  normal mood and affect. behavior is normal. Thought content normal.    ASSESSMENT & PLAN:    Cellulitis Recent hospitalization treated with antibiotics, still with mild erythema likely exacerbated by swelling  Chronic systolic HF (heart failure) (HCC) - Recommend he continue torsemide 40 twice daily, added metolazone 2.5 three days a week He is already taking it 3 days with dramatic increased urination We will increase potassium up to 20 daily with 40 daily on days with metolazone  Coronary artery disease of native artery of native heart with stable angina pectoris ([place]) - Previously declined CABG Currently with no symptoms of angina. No further workup at this time. Continue current medication regimen. Smoking cessation recommended  Type 2 diabetes mellitus with foot ulcer, with long-term current use of insulin ([place])  Foot ulcer being treated by vascular last seen beginning of March 2021 was given Bactrim Stressed importance of smoking cessation  Atherosclerosis of artery of extremity with ulceration ([place]) -  Followed by vascular, prior stent left SFA Amputation on the right Still smoking, cessation recommended Stable  Essential hypertension - Blood pressure stable, continue diuresis as above  PAD (peripheral artery disease) ([place]) -  Smoking cessation recommended Does not want Chantix  Mixed hyperlipidemia -  Last cholesterol at goal   Total encounter time more than 45 minutes  Greater than 50% was spent in counseling and coordination of care with the patient   Signed, [name] Rogue, MD  10/04/2019 2:17 PM    [place] [facility] [id number], [place], [place] [id number]

## 2019-10-04 ENCOUNTER — Telehealth (HOSPITAL_COMMUNITY): Payer: Self-pay

## 2019-10-04 ENCOUNTER — Ambulatory Visit (INDEPENDENT_AMBULATORY_CARE_PROVIDER_SITE_OTHER): Payer: Medicare Other | Admitting: Cardiovascular Disease

## 2019-10-04 ENCOUNTER — Other Ambulatory Visit: Payer: Self-pay

## 2019-10-04 ENCOUNTER — Encounter: Payer: Self-pay | Admitting: Cardiovascular Disease

## 2019-10-04 VITALS — BP 120/80 | HR 106 | Ht 67.0 in | Wt 210.5 lb

## 2019-10-04 DIAGNOSIS — Z794 Long term (current) use of insulin: Secondary | ICD-10-CM

## 2019-10-04 DIAGNOSIS — E1159 Type 2 diabetes mellitus with other circulatory complications: Secondary | ICD-10-CM

## 2019-10-04 DIAGNOSIS — L03116 Cellulitis of left lower limb: Secondary | ICD-10-CM | POA: Diagnosis not present

## 2019-10-04 DIAGNOSIS — E785 Hyperlipidemia, unspecified: Secondary | ICD-10-CM | POA: Diagnosis not present

## 2019-10-04 DIAGNOSIS — I5022 Chronic systolic (congestive) heart failure: Secondary | ICD-10-CM

## 2019-10-04 DIAGNOSIS — Z720 Tobacco use: Secondary | ICD-10-CM

## 2019-10-04 DIAGNOSIS — I739 Peripheral vascular disease, unspecified: Secondary | ICD-10-CM

## 2019-10-04 DIAGNOSIS — I10 Essential (primary) hypertension: Secondary | ICD-10-CM

## 2019-10-04 DIAGNOSIS — J449 Chronic obstructive pulmonary disease, unspecified: Secondary | ICD-10-CM | POA: Diagnosis not present

## 2019-10-04 MED ORDER — METOLAZONE 2.5 MG PO TABS: 2.5000 mg | ORAL_TABLET | ORAL | 0 refills | Status: DC

## 2019-10-04 MED ORDER — POTASSIUM CHLORIDE ER 10 MEQ PO TBCR: 10.0000 meq | EXTENDED_RELEASE_TABLET | ORAL | 3 refills | Status: DC

## 2019-10-04 NOTE — Patient Instructions (Addendum)
BMP in 2 weeks with [facility]   Medication Instructions:  Metolazone 2.5 in the Am three days a week Stay on torsemide 40 mg twice a day  Potassium 10 meq twice a day On a day with metolazone, take 4 potassium  If you need a refill on your cardiac medications before your next appointment, please call your pharmacy.    Lab work: No new labs needed   If you have labs (blood work) drawn today and your tests are completely normal, you will receive your results only by: [name] MyChart Message (if you have MyChart) OR . A paper copy in the mail If you have any lab test that is abnormal or we need to change your treatment, we will call you to review the results.   Testing/Procedures: No new testing needed   Follow-Up: At [facility], you and your health needs are our priority.  As part of our continuing mission to provide you with exceptional heart care, we have created designated Provider Care Teams.  These Care Teams include your primary Cardiologist (physician) and Advanced Practice Providers (APPs -  Physician Assistants and Nurse Practitioners) who all work together to provide you with the care you need, when you need it.  . You will need a follow up appointment in 1 month  . Providers on your designated Care Team:   . Murray Hodgkins, NP . Christell Faith, PA-C . Marrianne Mood, PA-C  Any Other Special Instructions Will Be Listed Below (If Applicable).  For educational health videos Log in to : [email or web address] Or : [email or web address], password : [email or web address]

## 2019-10-04 NOTE — Telephone Encounter (Signed)
Attempted to contact.  Left message.   [place] [phone]

## 2019-10-05 ENCOUNTER — Telehealth: Payer: Self-pay | Admitting: Family

## 2019-10-05 ENCOUNTER — Ambulatory Visit: Payer: Medicare Other | Admitting: Family

## 2019-10-05 NOTE — Telephone Encounter (Signed)
Spoke to patient who said he is doing a little better but not by much since he left hospital. He is still taking daily weights, following low sodium diet, taking medications as he is suppose too. He is still having a lot of swelling in legs and abdomen. He confirmed his follow up appointment we made.    Alyse Low, [place]

## 2019-10-06 ENCOUNTER — Encounter (HOSPITAL_COMMUNITY): Payer: Self-pay

## 2019-10-06 ENCOUNTER — Other Ambulatory Visit (HOSPITAL_COMMUNITY): Payer: Self-pay

## 2019-10-06 NOTE — Progress Notes (Signed)
Today had first home visit with [name] [name].  Explained the program and he is willing to be part of it.  Verified his medications by his medication bottles.  He does not like using a medication box, he prefers to take them out of the bottles.  He states feeling good today, he has no swelling in his leg, redness is getting better he says.  He has socks on that come up high to help with swelling.  Discussed elevating leg when sitting.  Discussed high salt foods and not adding salt.  Right now he adds salt to certain foods, explained importance of not using it.  Discussed fluid intake and he states working on getting it down.  He is not weighing, discussed importance of weighing and he states he will start and right it down for mw.  He is aware of his up coming appts.  He denies chest pain, headaches, dizziness or shortness of breath.  He states mostly stays at home except for doctor appts.  He stays away from sick people.  He does not want the covid vaccine.  His girlfriend lives with him and she has had the covid vaccine.  He is aware of the red, yellow and green zones.  He is aware when to call me, doctor or to call 911.  He is aware of 3 lbs weight gain over night and 5 lbs in a week to call for help.  Will continue to visit for heart failure, diet and medication compliance.   [place] [phone]

## 2019-10-07 ENCOUNTER — Other Ambulatory Visit: Payer: Self-pay

## 2019-10-07 ENCOUNTER — Encounter (INDEPENDENT_AMBULATORY_CARE_PROVIDER_SITE_OTHER): Payer: Self-pay | Admitting: Nurse Practitioner

## 2019-10-07 ENCOUNTER — Ambulatory Visit (INDEPENDENT_AMBULATORY_CARE_PROVIDER_SITE_OTHER): Payer: Medicare Other | Admitting: Nurse Practitioner

## 2019-10-07 VITALS — BP 111/78 | HR 106 | Resp 16

## 2019-10-07 DIAGNOSIS — L97522 Non-pressure chronic ulcer of other part of left foot with fat layer exposed: Secondary | ICD-10-CM

## 2019-10-07 DIAGNOSIS — L97929 Non-pressure chronic ulcer of unspecified part of left lower leg with unspecified severity: Secondary | ICD-10-CM

## 2019-10-07 DIAGNOSIS — I890 Lymphedema, not elsewhere classified: Secondary | ICD-10-CM | POA: Diagnosis not present

## 2019-10-07 DIAGNOSIS — I83029 Varicose veins of left lower extremity with ulcer of unspecified site: Secondary | ICD-10-CM | POA: Diagnosis not present

## 2019-10-08 DIAGNOSIS — G8929 Other chronic pain: Secondary | ICD-10-CM | POA: Insufficient documentation

## 2019-10-08 DIAGNOSIS — Z0289 Encounter for other administrative examinations: Secondary | ICD-10-CM | POA: Insufficient documentation

## 2019-10-08 DIAGNOSIS — M5440 Lumbago with sciatica, unspecified side: Secondary | ICD-10-CM | POA: Insufficient documentation

## 2019-10-11 ENCOUNTER — Encounter (INDEPENDENT_AMBULATORY_CARE_PROVIDER_SITE_OTHER): Payer: Self-pay | Admitting: Nurse Practitioner

## 2019-10-11 NOTE — Progress Notes (Signed)
Subjective:    Patient ID: [name] [name], male    DOB: [birth year], 59 y.o.   MRN: [id number] Chief Complaint  Patient presents with  . Follow-up    unna check    The patient returns today for evaluation of lower extremity edema as well as ulceration after having an interim placed on the left lower extremity.  Since the patient was last seen in our office he actually was hospitalized for several days for a heart failure exacerbation.  During this time he was diuresed and some of his medications were changed.  Today, the patient states that he feels much better and his lower extremity edema is under much better control.  The patient's venous ulcerations have mostly scabbed over and healed.  The patient wound on his left medial heel is also doing much better.  It is much more shallow.  No evidence of foul-smelling drainage or infection.  Overall the patient is doing well.   Review of Systems  Respiratory: Negative for shortness of breath.   Cardiovascular: Negative for leg swelling.  Skin: Positive for wound.  All other systems reviewed and are negative.      Objective:   Physical Exam Vitals reviewed.  Cardiovascular:     Rate and Rhythm: Normal rate and regular rhythm.  Musculoskeletal:     Left lower leg: 1+ Edema present.       Feet:     Right Lower Extremity: Right leg is amputated below knee.  Feet:     Left foot:     Skin integrity: Ulcer present.  Neurological:     Mental Status: He is alert and oriented to person, place, and time.  Psychiatric:        Mood and Affect: Mood normal.        Behavior: Behavior normal.        Thought Content: Thought content normal.        Judgment: Judgment normal.     BP 111/78 (BP Location: Right Arm)   Pulse (!) 106   Resp 16   Past Medical History:  Diagnosis Date  . Arthritis   . Asthma   . Atherosclerosis   . BPH (benign prostatic hyperplasia)   . Carotid arterial disease ([place])   . Charcot's joint of foot, right   .  Chronic combined systolic (congestive) and diastolic (congestive) heart failure ([place])    a. 02/2013 EF 50% by LV gram; b. 04/2017 Echo: EF 25-30%. diff HK. Gr2 DD. Mod dil LA/RV. PASP 26mmHg.  [name] COPD (chronic obstructive pulmonary disease) ([place])   . Coronary artery disease    a. 2013 S/P PCI of LAD [facility]);  b. 03/2013 PCI: RCA 90p ( 2.5 x 23 mm DES); c. 02/2014 Cath: patent RCA stent->Med Rx; d. 04/2017 Cath: LM nl, LAd 30ost/p, 62m/d, D1 80ost, LCX 90p/m, OM1 85, RCA 70/20p, 27m, 70d->Referred for CT Surg-felt to be poor candidate.  . Diabetic neuropathy ([place])   . Diabetic ulcer of right foot ([place])   . Hernia   . Hyperlipidemia   . Hypertension   . Ischemic cardiomyopathy    a. 04/2017 Echo: EF 25-30%; b. 08/2017 Cardiac MRI ([facility]): EF 19%, sev glob HK. RVEF 20%, mod BAE, triv MR, mild-mod TR. Basal lateral subendocardial infarct (viable), inf/infsept, dist septal ischemia, basal to mid lat peri-infarct ischemia.  . Morbid obesity ([place])   . Neuropathy   . PAD (peripheral artery disease) ([place])    a. Followed by Dr. Lucky Cowboy; b. 08/2010  Periph Angio: RSFA 70-80p (6X60 self-expanding stent); c. 09/2010 Periph Angio: L SFA 100p (7x unknown length self-expanding stent); d. 06/2015 Periph Angio: R SFA short segment occlusion (6x12 self-expanding stent).  . Restless leg syndrome   . Sleep apnea   . Subclavian artery stenosis, right ([place])   . Syncope and collapse   . Tobacco use    a. 75+ yr hx - still smoking 1ppd, down from 2 ppd.  . Type II diabetes mellitus ([place])   . Varicose vein     Social History   Socioeconomic History  . Marital status: Single    Spouse name: Not on file  . Number of children: 0  . Years of education: 8  . Highest education level: 12th grade  Occupational History  . Occupation: on disability  Tobacco Use  . Smoking status: Current Every Day Smoker    Packs/day: 1.00    Years: 41.00    Pack years: 41.00    Types: Cigarettes  . Smokeless tobacco: Never Used  Substance  and Sexual Activity  . Alcohol use: No  . Drug use: No  . Sexual activity: Yes  Other Topics Concern  . Not on file  Social History Narrative   Lives at home in [place] with girlfriend. Independent at baseline.   Social Determinants of Health   Financial Resource Strain:   . Difficulty of Paying Living Expenses:   Food Insecurity:   . Worried About Charity fundraiser in the Last Year:   . Arboriculturist in the Last Year:   Transportation Needs:   . Film/video editor (Medical):   [name] Lack of Transportation (Non-Medical):   Physical Activity:   . Days of Exercise per Week:   . Minutes of Exercise per Session:   Stress:   . Feeling of Stress :   Social Connections:   . Frequency of Communication with Friends and Family:   . Frequency of Social Gatherings with Friends and Family:   . Attends Religious Services:   . Active Member of Clubs or Organizations:   . Attends Archivist Meetings:   [name] Marital Status:   Intimate Partner Violence:   . Fear of Current or Ex-Partner:   . Emotionally Abused:   [name] Physically Abused:   . Sexually Abused:     Past Surgical History:  Procedure Laterality Date  . ABDOMINAL AORTAGRAM N/A 06/23/2013   Procedure: ABDOMINAL Maxcine Ham;  Surgeon: Wellington Hampshire, MD;  Location: [facility] CATH LAB;  Service: Cardiovascular;  Laterality: N/A;  . AMPUTATION Right 04/01/2018   Procedure: AMPUTATION BELOW KNEE;  Surgeon: Algernon Huxley, MD;  Location: [facility] ORS;  Service: Vascular;  Laterality: Right;  . APPENDECTOMY    . CARDIAC CATHETERIZATION  10/14   [facility]; X1 STENT PROXIMAL RCA  . CARDIAC CATHETERIZATION  02/2010   [facility]  . CARDIAC CATHETERIZATION  02/21/2014   [facility]  . CLAVICLE SURGERY    . HERNIA REPAIR    . IRRIGATION AND DEBRIDEMENT FOOT Right 01/04/2018   Procedure: IRRIGATION AND DEBRIDEMENT FOOT;  Surgeon: Samara Deist, DPM;  Location: [facility] ORS;  Service: Podiatry;  Laterality: Right;  . LEFT HEART CATH AND CORS/GRAFTS ANGIOGRAPHY  N/A 04/28/2017   Procedure: LEFT HEART CATH AND CORONARY ANGIOGRAPHY;  Surgeon: Wellington Hampshire, MD;  Location: [place] CV LAB;  Service: Cardiovascular;  Laterality: N/A;  . LOWER EXTREMITY ANGIOGRAPHY Right 03/03/2018   Procedure: LOWER EXTREMITY ANGIOGRAPHY;  Surgeon: Katha Cabal, MD;  Location: [place] CV LAB;  Service: Cardiovascular;  Laterality: Right;  . LOWER EXTREMITY ANGIOGRAPHY Left 08/24/2018   Procedure: LOWER EXTREMITY ANGIOGRAPHY;  Surgeon: Algernon Huxley, MD;  Location: [place] CV LAB;  Service: Cardiovascular;  Laterality: Left;  . LOWER EXTREMITY ANGIOGRAPHY Left 06/14/2019   Procedure: LOWER EXTREMITY ANGIOGRAPHY;  Surgeon: Algernon Huxley, MD;  Location: [place] CV LAB;  Service: Cardiovascular;  Laterality: Left;  . LOWER EXTREMITY ANGIOGRAPHY Left 08/02/2019   Procedure: LOWER EXTREMITY ANGIOGRAPHY;  Surgeon: Algernon Huxley, MD;  Location: [place] CV LAB;  Service: Cardiovascular;  Laterality: Left;  . PERIPHERAL ARTERIAL STENT GRAFT     x2 left/right  . PERIPHERAL VASCULAR BALLOON ANGIOPLASTY Right 01/06/2018   Procedure: PERIPHERAL VASCULAR BALLOON ANGIOPLASTY;  Surgeon: Katha Cabal, MD;  Location: [place] CV [facility];  Service: Cardiovascular;  Laterality: Right;  . PERIPHERAL VASCULAR CATHETERIZATION N/A 07/06/2015   Procedure: Abdominal Aortogram w/Lower Extremity;  Surgeon: Algernon Huxley, MD;  Location: [place] CV [facility];  Service: Cardiovascular;  Laterality: N/A;  . PERIPHERAL VASCULAR CATHETERIZATION  07/06/2015   Procedure: Lower Extremity Intervention;  Surgeon: Algernon Huxley, MD;  Location: [place] CV [facility];  Service: Cardiovascular;;    Family History  Problem Relation Age of Onset  . Heart attack Father   . Heart disease Father   . Hypertension Father   . Hyperlipidemia Father   . Hypertension Mother   . Hyperlipidemia Mother     Allergies  Allergen Reactions  . Dulaglutide Anaphylaxis, Diarrhea and Hives  .  Other Itching    Skin itching associated with nitro patch  . Prednisone Rash       Assessment & Plan:   1. Lymphedema Patient's lower extremity edema is under much better control.  I will do that the patient had a heart failure exacerbation which actually made the situation much worse.  The patient spent several days in the hospital with IV diuresis and his swelling is now much better controlled.  The patient is advised to continue with conservative therapy such as utilizing medical grade 1 compression, elevation and exercise as tolerated.  2. Venous ulcer of left leg (HCC) Previous venous ulcerations have healed.  Patient is advised to continue to 1.1 compression therapy to lower extremity edema.  3. Chronic foot ulcer, left, with fat layer exposed ([place]) Currently the patient's wound on his medial heel is doing much better.  The wound is becoming more superficial.  He denies any worsening drainage or foul-smelling drainage.  Patient is to continue with current wound care dressings.  We will have the patient return in 6 weeks to evaluate wound progression.   Current Outpatient Medications on File Prior to Visit  Medication Sig Dispense Refill  . albuterol (VENTOLIN HFA) 108 (90 Base) MCG/ACT inhaler Inhale into the lungs every 6 (six) hours as needed for wheezing or shortness of breath.    [name] aspirin EC 81 MG tablet Take 1 tablet (81 mg total) by mouth daily. 90 tablet 3  . atorvastatin (LIPITOR) 80 MG tablet Take 80 mg by mouth daily.    . calcium carbonate (TUMS - DOSED IN MG ELEMENTAL CALCIUM) 500 MG chewable tablet Chew 4 tablets by mouth daily as needed for indigestion or heartburn.    . clopidogrel (PLAVIX) 75 MG tablet Take 75 mg by mouth daily.     . DULoxetine (CYMBALTA) 20 MG capsule Take 20 mg by mouth daily.    . Insulin Glargine (LANTUS SOLOSTAR)  100 UNIT/ML Solostar Pen Inject 30 Units into the skin at bedtime. (Patient taking differently: Inject 20-40 Units into the skin 2  (two) times daily. 40 units in the morning and 20 units at night) 15 mL 11  . insulin lispro (HUMALOG) 100 UNIT/ML injection Sliding Scale Insulin before meals using Novolog If BS is 0 to 70 no insulin and eat/glucose tablets/glucose get If BS 71 to 150 great! If BS 151 to 200, 2 Units Novolog If BS 201 to 250, 4 Units Novolog If BS 251 to 300, 6 Units Novolog If BS 301 to 350, 8 Units Novolog If BS 351 to 400, 10 Units Novolog If BS greater than 400, take 10 Units and Contact our office during office hour    . ipratropium-albuterol (DUONEB) 0.5-2.5 (3) MG/3ML SOLN Inhale 3 mLs into the lungs every 6 (six) hours as needed (wheezing/sob).     . metFORMIN (GLUCOPHAGE-XR) 500 MG 24 hr tablet Take 1,000 mg by mouth 2 (two) times daily.     . metolazone (ZAROXOLYN) 2.5 MG tablet Take 1 tablet (2.5 mg total) by mouth 3 (three) times a week. 36 tablet 0  . Multiple Vitamin (MULTI-VITAMINS) TABS Take 1 tablet by mouth daily.     . nitroGLYCERIN (NITROSTAT) 0.4 MG SL tablet Place 1 tablet (0.4 mg total) under the tongue every 5 (five) minutes as needed for chest pain. 30 tablet 5  . Oxycodone HCl 10 MG TABS Take 10 mg by mouth every 6 (six) hours as needed (pain).     . potassium chloride (KLOR-CON) 10 MEQ tablet Take 1 tablet (10 mEq total) by mouth as directed. Take 1 tablet twice a day on days with metolazone take 4 tablets (40 mEq) 90 tablet 3  . sacubitril-valsartan (ENTRESTO) 24-26 MG Take 1 tablet by mouth 2 (two) times daily. 60 tablet 2  . spironolactone (ALDACTONE) 25 MG tablet Take 1 tablet (25 mg total) by mouth daily. 30 tablet 1  . torsemide (DEMADEX) 20 MG tablet Take 2 tablets (40 mg total) by mouth 2 (two) times daily. 40mg  AM/ 20mg  PM (Patient taking differently: Take 40 mg by mouth 2 (two) times daily. ) 180 tablet 3  . traZODone (DESYREL) 150 MG tablet Take 150 mg by mouth at bedtime.    . collagenase (SANTYL) ointment Apply 1 application topically daily. (Patient not taking: Reported on  10/06/2019) 90 g 1  . ezetimibe (ZETIA) 10 MG tablet Take 1 tablet (10 mg total) by mouth daily. (Patient not taking: Reported on 10/06/2019) 30 tablet 5  . gabapentin (NEURONTIN) 800 MG tablet Take 800 mg by mouth 4 (four) times daily.     . isosorbide mononitrate (IMDUR) 30 MG 24 hr tablet Take 1 tablet (30 mg total) by mouth daily. (Patient not taking: Reported on 10/06/2019) 30 tablet 5  . metoprolol succinate (TOPROL-XL) 25 MG 24 hr tablet Take 1 tablet (25 mg total) by mouth daily. (Patient not taking: Reported on 10/06/2019) 15 tablet 5  . nicotine (NICODERM CQ - DOSED IN MG/24 HOURS) 21 mg/24hr patch Place 1 patch (21 mg total) onto the skin daily. (Patient not taking: Reported on 10/06/2019) 28 patch 0  . potassium chloride (KLOR-CON) 10 MEQ tablet Take 1 tablet (10 mEq total) by mouth daily. (Patient not taking: Reported on 10/06/2019) 30 tablet 5   No current facility-administered medications on file prior to visit.    There are no Patient Instructions on file for this visit. No follow-ups on file.   Arna Medici  Earley Favor, NP

## 2019-10-18 ENCOUNTER — Telehealth (HOSPITAL_COMMUNITY): Payer: Self-pay

## 2019-10-18 NOTE — Telephone Encounter (Signed)
Today had a telephone visit with [name] [name].  He states feels great.  He said had appt with PCP and it went well.  He did prescribe him omeprazole 40 mg once daily.  He states has helped heart burn.  He denies any chest pain, shortness of breath, headaches or dizziness.  He states has all his medications except his gabapentin and he just needs to pick it up, ran out. Verified his meds.  He is watching his sodium better, he is not cooking or salt to foods except mashed potatoes.  Advised him to not eat mashed potatoes often, he states trying to cut back.  He denies swelling or feeling full in his abdomen.  Discussed fluids and he is trying to watch it.  Will continue to visit for heart failure, diet and medication compliance.   [place] [phone]

## 2019-10-19 NOTE — Progress Notes (Deleted)
Patient ID: [name] [name], male    DOB: [birth year], 59 y.o.   MRN: [id number]  HPI  [name] [name] is a 59 y/o male with a history of obstructive sleep apnea, PVD, PAD, HTN, hyperlipidemia, DM, CAD, COPD, BPH, asthma, current tobacco use and chronic heart failure.   Echo report from 09/29/19 reviewed and showed an EF of 20-25% along with moderately elevated PA pressure, mild [name] and mild/moderate TR. Echo report from 11/06/2018 reviewed and showed an EF of 20-25% along with moderate TR and a PA pressure of 59.6 mmHg. Echo report from 04/25/17 reviewed and shows an EF of 25-30% along with a PA pressure of 34 mm Hg. EF has declined from 55-60% back in 2016.   Cardiac catheterization done 04/28/17 showed significant three-vessel disease with a patient stent in the RCA and LAD. Significant proximal RCS disease and the stent as well as diffuse mid and distal disease. LAD has moderate disease. Severely elevated left ventricular end-diastolic pressure at 34 mmHg. Possible CABG in the future with optimizing medical management. Stress done in 2015.  Admitted 09/27/19 due to left lower leg cellulitis and acute on chronic HF. Cardiology and wound consult obtained. Given IV lasix and antibiotics with transition to oral medications. Venous doppler negative for DVT. Discharged after 3 days. Was in the ED 09/22/19 but left without being seen. Was in the ED 09/17/19 but left without being seen. Was in the ED 09/10/19 due to pedal edema. Admits to not taking his medications. Given IV lasix with improvement in symptoms and he was released. Was in the ED 3/22/210 due to HF exacerbation. Nebulizer given for wheezing. CT and [place] were negative for clots. Mildly elevated troponin thought to be due to demand ischemia. IV antibiotics given for LLL cellulitis. Patient left AMA  He presents today for a follow-up visit with a chief complaint of   Past Medical History:  Diagnosis Date  . Arthritis   . Asthma   . Atherosclerosis   . BPH  (benign prostatic hyperplasia)   . Carotid arterial disease ([place])   . Charcot's joint of foot, right   . Chronic combined systolic (congestive) and diastolic (congestive) heart failure ([place])    a. 02/2013 EF 50% by LV gram; b. 04/2017 Echo: EF 25-30%. diff HK. Gr2 DD. Mod dil LA/RV. PASP 82mmHg.  [name] COPD (chronic obstructive pulmonary disease) ([place])   . Coronary artery disease    a. 2013 S/P PCI of LAD [facility]);  b. 03/2013 PCI: RCA 90p ( 2.5 x 23 mm DES); c. 02/2014 Cath: patent RCA stent->Med Rx; d. 04/2017 Cath: LM nl, LAd 30ost/p, 16m/d, D1 80ost, LCX 90p/m, OM1 85, RCA 70/20p, 48m, 70d->Referred for CT Surg-felt to be poor candidate.  . Diabetic neuropathy ([place])   . Diabetic ulcer of right foot ([place])   . Hernia   . Hyperlipidemia   . Hypertension   . Ischemic cardiomyopathy    a. 04/2017 Echo: EF 25-30%; b. 08/2017 Cardiac MRI ([facility]): EF 19%, sev glob HK. RVEF 20%, mod BAE, triv [name], mild-mod TR. Basal lateral subendocardial infarct (viable), inf/infsept, dist septal ischemia, basal to mid lat peri-infarct ischemia.  . Morbid obesity ([place])   . Neuropathy   . PAD (peripheral artery disease) ([place])    a. Followed by Dr. Lucky Cowboy; b. 08/2010 Periph Angio: RSFA 70-80p (6X60 self-expanding stent); c. 09/2010 Periph Angio: L SFA 100p (7x unknown length self-expanding stent); d. 06/2015 Periph Angio: R SFA short segment occlusion (6x12 self-expanding stent).  [name]  Restless leg syndrome   . Sleep apnea   . Subclavian artery stenosis, right ([place])   . Syncope and collapse   . Tobacco use    a. 75+ yr hx - still smoking 1ppd, down from 2 ppd.  . Type II diabetes mellitus ([place])   . Varicose vein    Past Surgical History:  Procedure Laterality Date  . ABDOMINAL AORTAGRAM N/A 06/23/2013   Procedure: ABDOMINAL Maxcine Ham;  Surgeon: Wellington Hampshire, MD;  Location: [place] CATH LAB;  Service: Cardiovascular;  Laterality: N/A;  . AMPUTATION Right 04/01/2018   Procedure: AMPUTATION BELOW KNEE;  Surgeon: Algernon Huxley, MD;   Location: [facility] ORS;  Service: Vascular;  Laterality: Right;  . APPENDECTOMY    . CARDIAC CATHETERIZATION  10/14   [facility]; X1 STENT PROXIMAL RCA  . CARDIAC CATHETERIZATION  02/2010   [facility]  . CARDIAC CATHETERIZATION  02/21/2014   [facility]  . CLAVICLE SURGERY    . HERNIA REPAIR    . IRRIGATION AND DEBRIDEMENT FOOT Right 01/04/2018   Procedure: IRRIGATION AND DEBRIDEMENT FOOT;  Surgeon: Samara Deist, DPM;  Location: [facility] ORS;  Service: Podiatry;  Laterality: Right;  . LEFT HEART CATH AND CORS/GRAFTS ANGIOGRAPHY N/A 04/28/2017   Procedure: LEFT HEART CATH AND CORONARY ANGIOGRAPHY;  Surgeon: Wellington Hampshire, MD;  Location: [place] CV LAB;  Service: Cardiovascular;  Laterality: N/A;  . LOWER EXTREMITY ANGIOGRAPHY Right 03/03/2018   Procedure: LOWER EXTREMITY ANGIOGRAPHY;  Surgeon: Katha Cabal, MD;  Location: [place] [facility];  Service: Cardiovascular;  Laterality: Right;  . LOWER EXTREMITY ANGIOGRAPHY Left 08/24/2018   Procedure: LOWER EXTREMITY ANGIOGRAPHY;  Surgeon: Algernon Huxley, MD;  Location: [place] CV LAB;  Service: Cardiovascular;  Laterality: Left;  . LOWER EXTREMITY ANGIOGRAPHY Left 06/14/2019   Procedure: LOWER EXTREMITY ANGIOGRAPHY;  Surgeon: Algernon Huxley, MD;  Location: [place] [facility];  Service: Cardiovascular;  Laterality: Left;  . LOWER EXTREMITY ANGIOGRAPHY Left 08/02/2019   Procedure: LOWER EXTREMITY ANGIOGRAPHY;  Surgeon: Algernon Huxley, MD;  Location: [place] [facility];  Service: Cardiovascular;  Laterality: Left;  . PERIPHERAL ARTERIAL STENT GRAFT     x2 left/right  . PERIPHERAL VASCULAR BALLOON ANGIOPLASTY Right 01/06/2018   Procedure: PERIPHERAL VASCULAR BALLOON ANGIOPLASTY;  Surgeon: Katha Cabal, MD;  Location: [place] CV LAB;  Service: Cardiovascular;  Laterality: Right;  . PERIPHERAL VASCULAR CATHETERIZATION N/A 07/06/2015   Procedure: Abdominal Aortogram w/Lower Extremity;  Surgeon: Algernon Huxley, MD;  Location: [place] CV LAB;  Service:  Cardiovascular;  Laterality: N/A;  . PERIPHERAL VASCULAR CATHETERIZATION  07/06/2015   Procedure: Lower Extremity Intervention;  Surgeon: Algernon Huxley, MD;  Location: [place] CV LAB;  Service: Cardiovascular;;   Family History  Problem Relation Age of Onset  . Heart attack Father   . Heart disease Father   . Hypertension Father   . Hyperlipidemia Father   . Hypertension Mother   . Hyperlipidemia Mother    Social History   Tobacco Use  . Smoking status: Current Every Day Smoker    Packs/day: 1.00    Years: 41.00    Pack years: 41.00    Types: Cigarettes  . Smokeless tobacco: Never Used  Substance Use Topics  . Alcohol use: No   Allergies  Allergen Reactions  . Dulaglutide Anaphylaxis, Diarrhea and Hives  . Other Itching    Skin itching associated with nitro patch  . Prednisone Rash      Review of Systems  Constitutional: Positive  for fatigue (minimal). Negative for appetite change.  HENT: Positive for rhinorrhea. Negative for congestion, postnasal drip and sore throat.   Eyes: Negative.   Respiratory: Positive for shortness of breath (upon exertion) and wheezing. Negative for cough and chest tightness.   Cardiovascular: Positive for leg swelling (left leg). Negative for chest pain and palpitations.  Gastrointestinal: Positive for abdominal distention. Negative for abdominal pain.  Endocrine: Negative.   Genitourinary: Negative.   Musculoskeletal: Positive for back pain. Negative for neck pain.       Has right lower leg prosthesis  Skin: Positive for wound (left foot).  Allergic/Immunologic: Negative.   Neurological: Positive for headaches. Negative for dizziness and light-headedness.  Hematological: Negative for adenopathy. Bruises/bleeds easily.  Psychiatric/Behavioral: Negative for dysphoric mood and sleep disturbance (sleeping on 2 pillows). The patient is not nervous/anxious.       Physical Exam  Constitutional: He is oriented to person, place, and time.  He appears well-developed and well-nourished.  HENT:  Head: Normocephalic and atraumatic.  Neck: No JVD present.  Cardiovascular: Normal rate and regular rhythm.  Pulmonary/Chest: Effort normal. He has no wheezes. He has rhonchi in the right upper field, the right lower field, the left upper field and the left lower field. He has no rales.  Abdominal: Soft. He exhibits distension. There is no abdominal tenderness.  Musculoskeletal:        General: Edema (2+ pitting edema in left lower leg ) present. No tenderness.     Cervical back: Normal range of motion and neck supple.  Neurological: He is alert and oriented to person, place, and time.  Skin: Skin is warm and dry. There is erythema (left anterior shin/ shiny in appearance).  Psychiatric: He has a normal mood and affect. His behavior is normal. Thought content normal.  Nursing note and vitals reviewed.   Assessment & Plan:  1: Acute on Chronic heart failure with reduced ejection fraction- - NYHA class III - continued to be moderately fluid overloaded today with edema and abdominal distention - not weighing daily; encouraged to resume so that he could call for an overnight weight gain of >2 pounds or a weekly weight gain of >5 pounds - weight 224.2 pounds from last visit here 1 month ago - not adding salt and has been using Mrs. Dash & trying to read food labels.  - drinking ~ 64 ounces of fluid daily (Mtn Dew, pepsi, water) - saw cardiologist Rockey Situ) 10/04/19  - BNP 09/26/19 was 1342.0 - says that he does not take the flu vaccine; good handwashing encouraged - participating in paramedicine program   2: HTN- - BP - saw PCP Dr. 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 Each medication was verbally reviewed with the patient and he was encouraged to bring the bottles to every visit to confirm accuracy of list. Patient does not know what he's taking by recall.

## 2019-10-20 ENCOUNTER — Ambulatory Visit: Payer: Medicare Other | Admitting: Family

## 2019-10-20 ENCOUNTER — Telehealth: Payer: Self-pay | Admitting: Family

## 2019-10-20 NOTE — Telephone Encounter (Signed)
Patient did not show for his Heart Failure Clinic appointment on 10/20/19. Will attempt to reschedule.

## 2019-10-21 ENCOUNTER — Telehealth (HOSPITAL_COMMUNITY): Payer: Self-pay

## 2019-10-21 NOTE — Telephone Encounter (Signed)
Contacted [name] [name] due to his missed his HF clinic appt.  He said he did not remember it, it was on his calendar.  Will cont HF clinic and advise them to reschedule.    [place] [phone]

## 2019-10-24 IMAGING — CR DG CHEST 2V
1 series · 2 of 2 positions shown · non-contrast
Comparison: 07/10/2016

CLINICAL DATA: Shortness of breath over the last 3-4 weeks.

EXAM:
CHEST  2 VIEW

[Series 1: dg chest 2 view · 0.14mm/px · 2 of 2 slices shown]
[im 1/2]
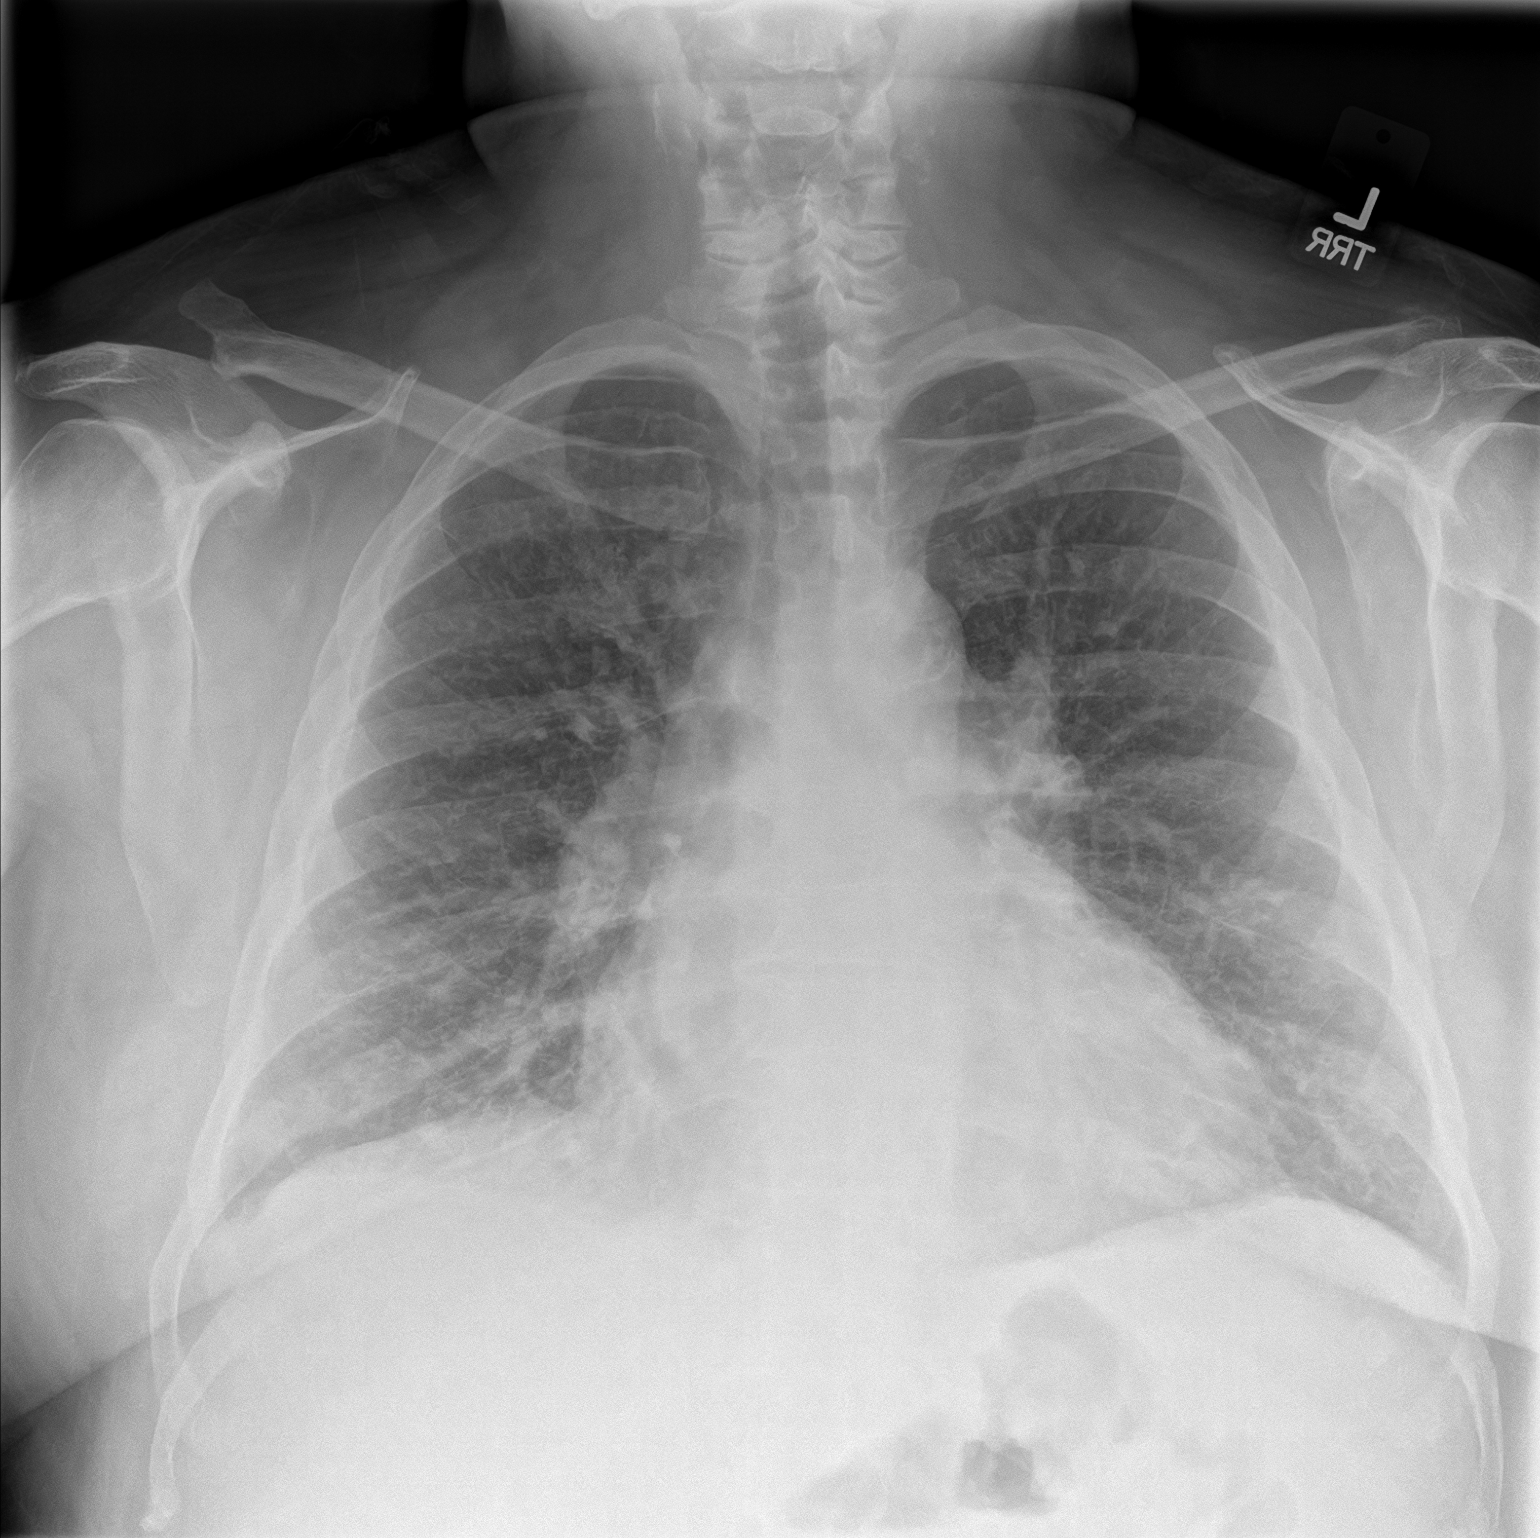
[im 2/2]
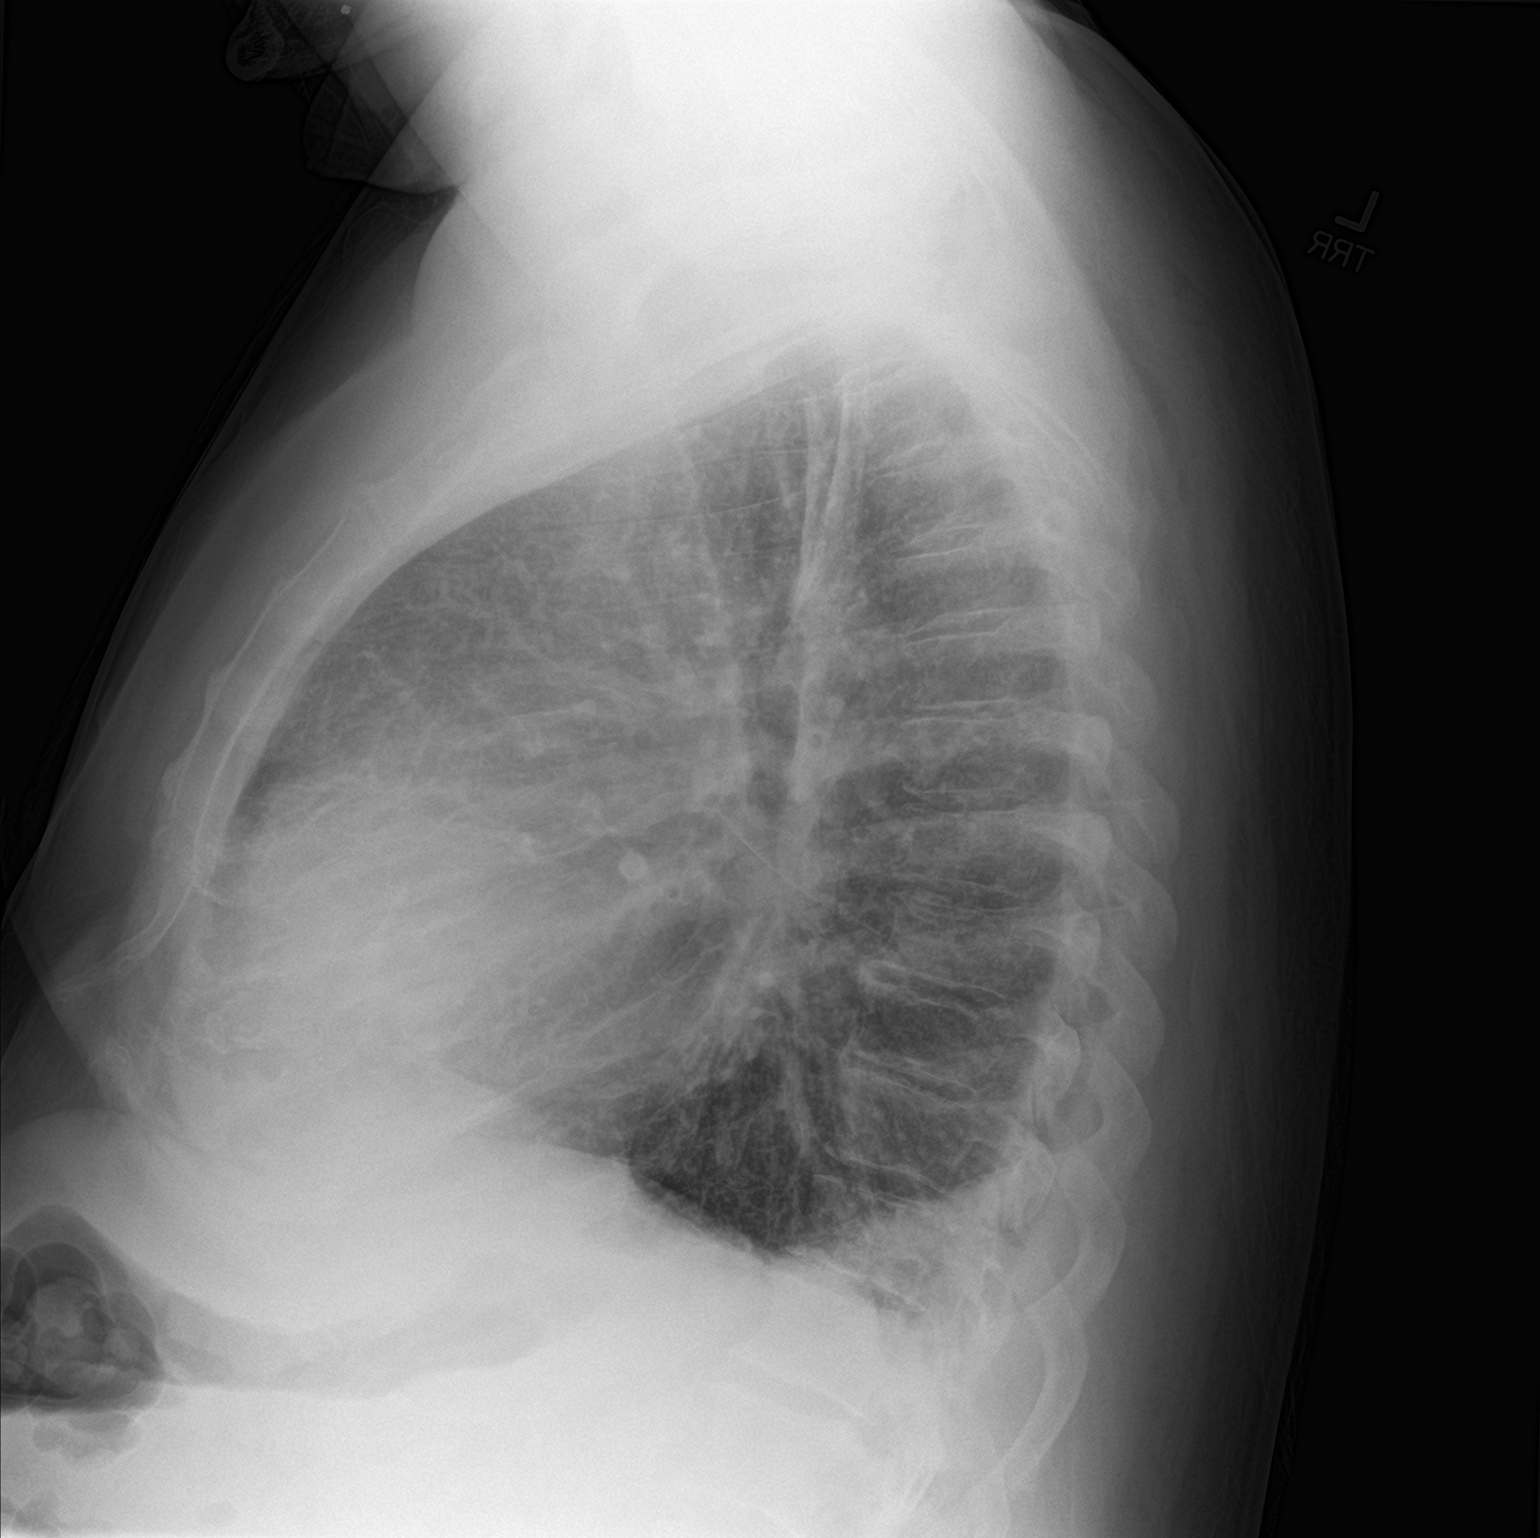

[2 of 2 positions shown; findings below may reference images not displayed]

FINDINGS: The heart is enlarged. There is aortic atherosclerosis. There is a
background pattern of abnormal interstitial lung markings, which
appear more prominent today. There are small bilateral effusions.
The findings are most consistent with fluid overload/congestive
heart failure. Coexistent pneumonia not excluded. No acute bone
finding.
IMPRESSION: Probable congestive heart failure with interstitial edema and small
effusions, superimposed upon chronic lung disease. Cannot rule out
the possibility of coexistent pneumonia.

## 2019-10-24 IMAGING — CT CT CHEST W/O CM
2 of 6 series · 14 of 36 positions shown, 18 images · non-contrast
Comparison: Chest radiograph.  04/22/2017 and CT dated 07/10/2016.

CLINICAL DATA: 56-year-old male with shortness of breath. History
of COPD and pneumonia.

EXAM:
CT CHEST WITHOUT CONTRAST
TECHNIQUE: Multidetector CT imaging of the chest was performed following the
standard protocol without IV contrast.

[Series 2: thorax · axial · 0.88mm/px · z∈[-886,-590]mm · 13 of 166 slices shown, 17 images]
[im 9/166  mediastinal]
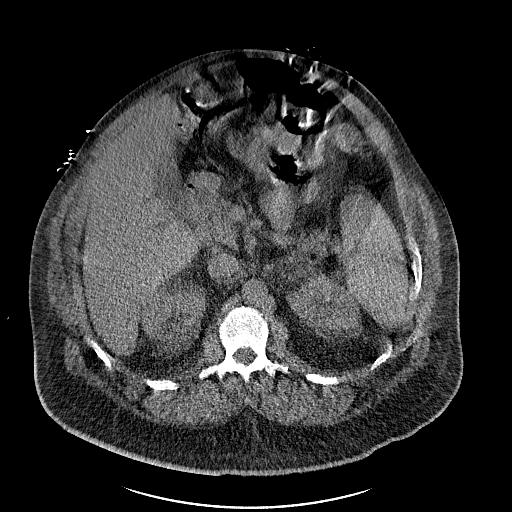
[im 9/166  lung]
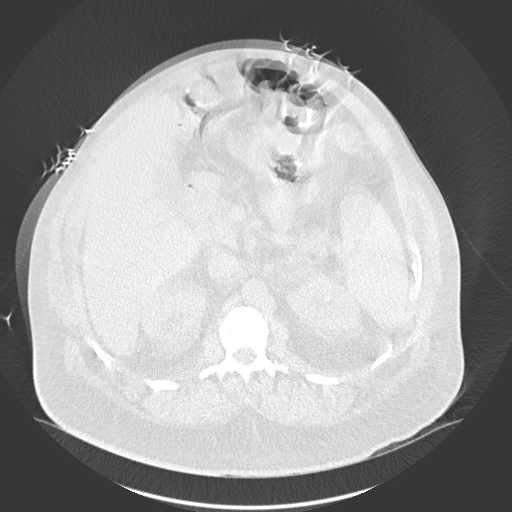
[im 27/166  lung]
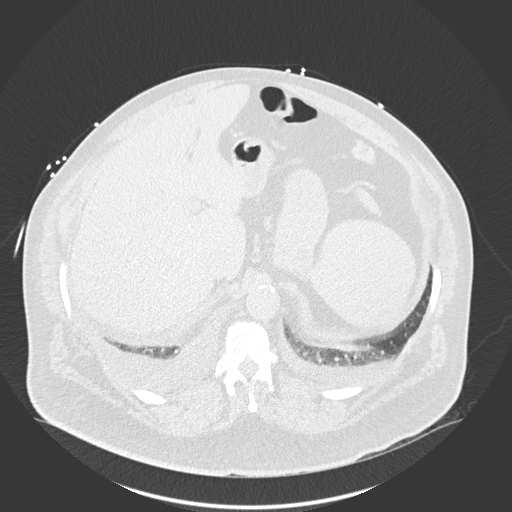
[im 35/166  lung]
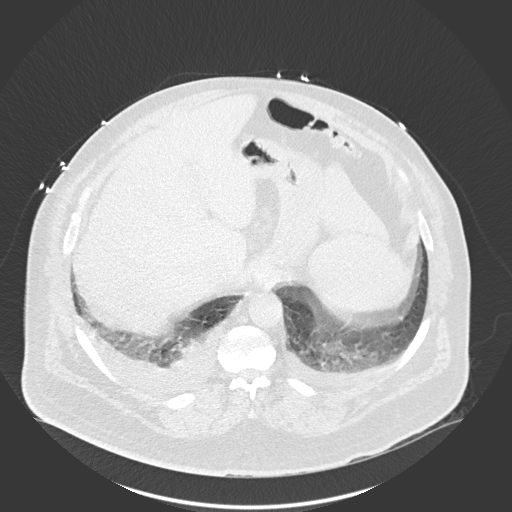
[im 44/166  lung]
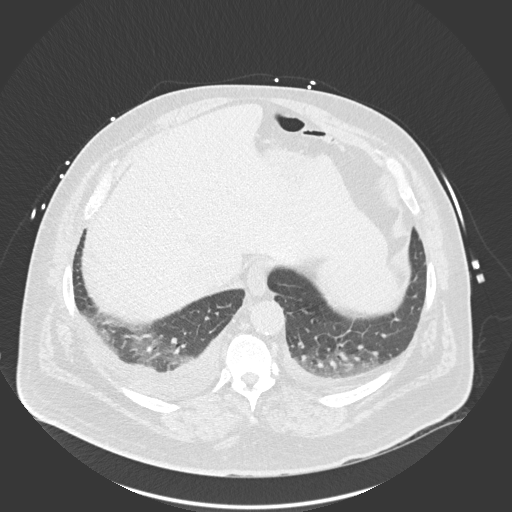
[im 61/166  mediastinal]
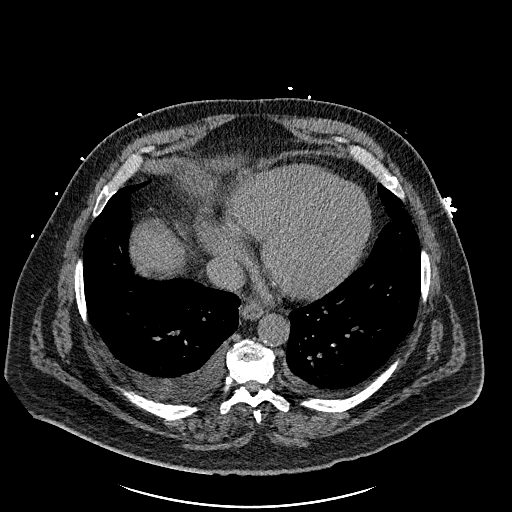
[im 61/166  lung]
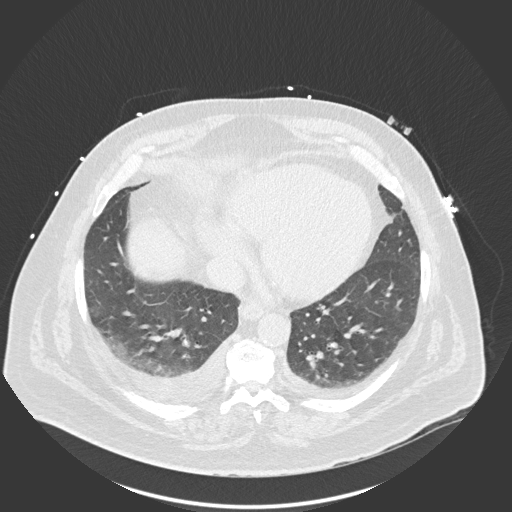
[im 70/166  lung]
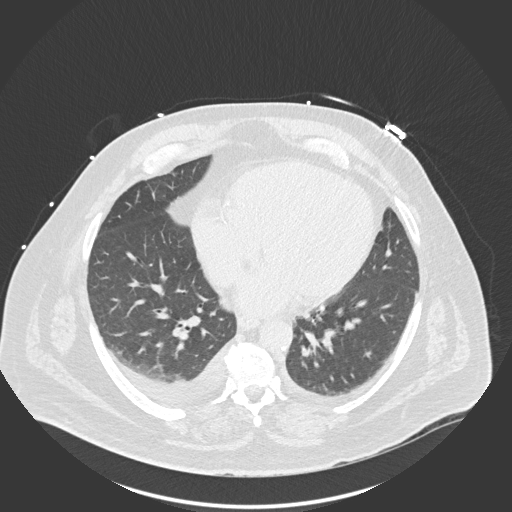
[im 87/166  lung]
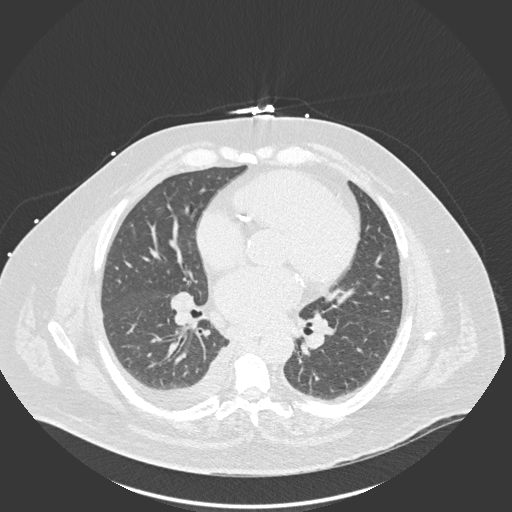
[im 96/166  lung]
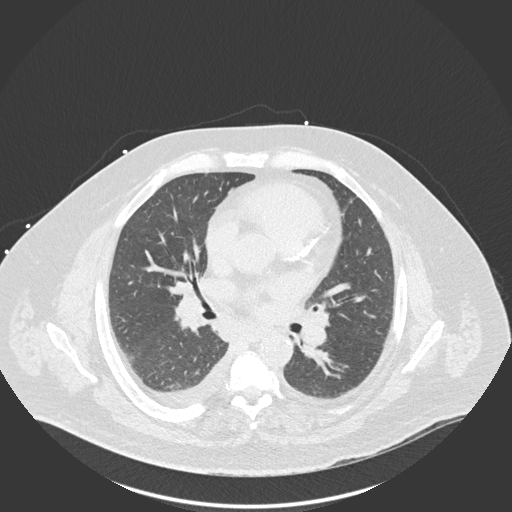
[im 105/166  mediastinal]
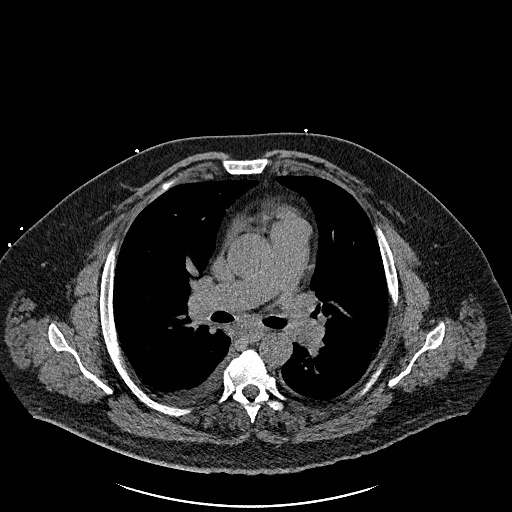
[im 105/166  lung]
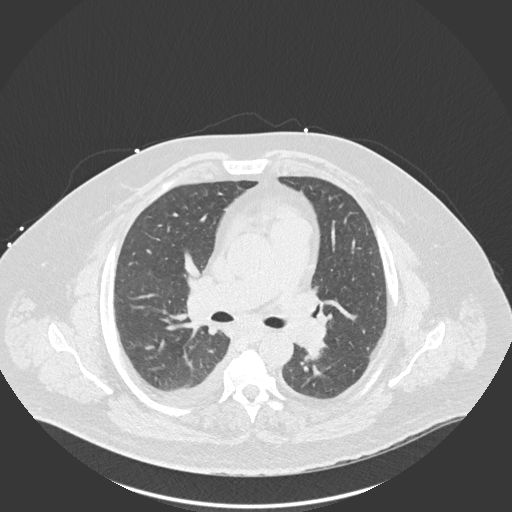
[im 122/166  lung]
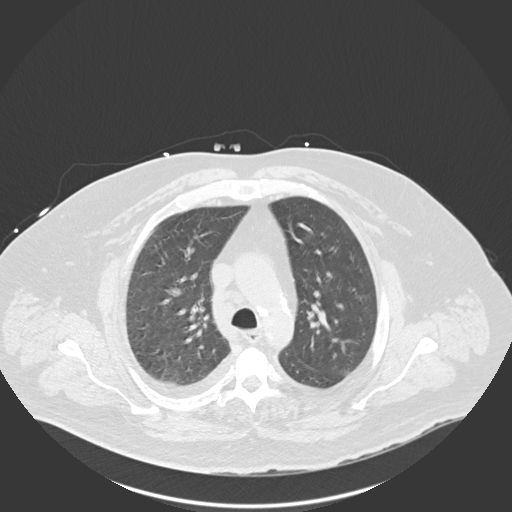
[im 131/166  lung]
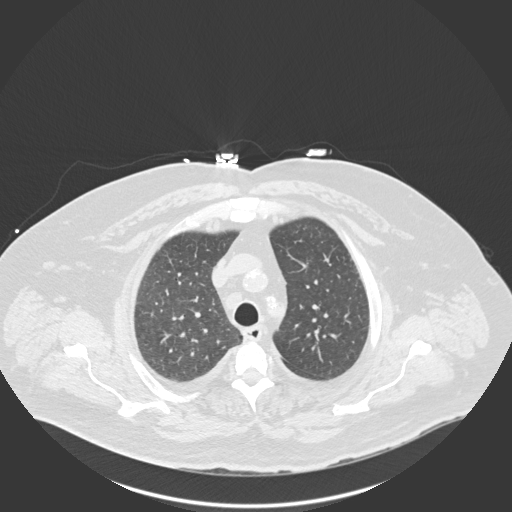
[im 139/166  lung]
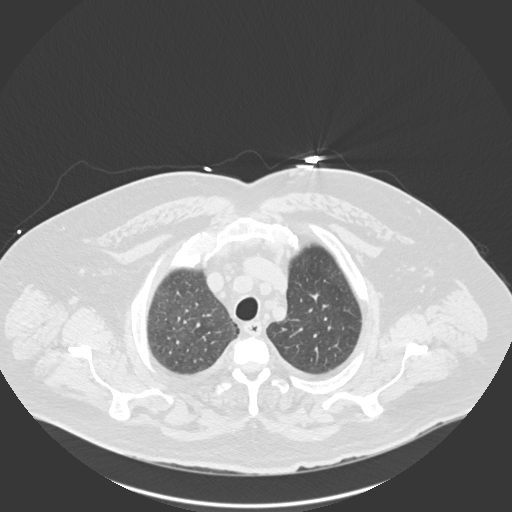
[im 157/166  mediastinal]
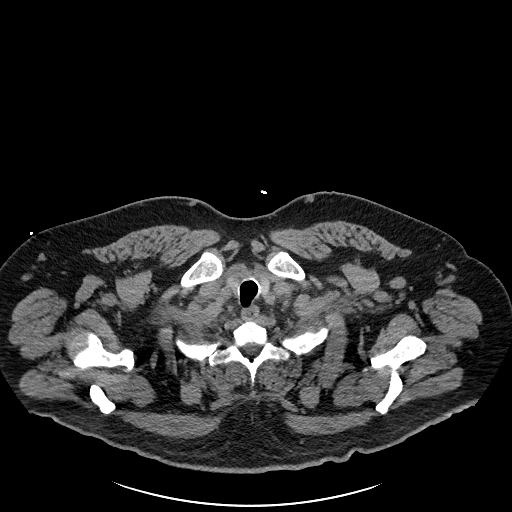
[im 157/166  lung]
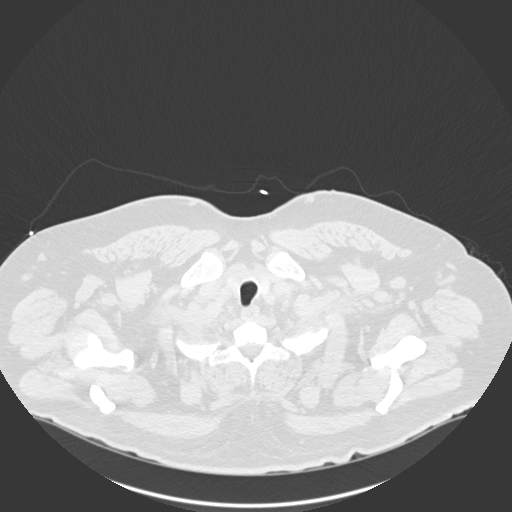

[Series 5: coronal · coronal · 0.65mm/px · 1 of 174 slices shown]
[im 87/174  lung]
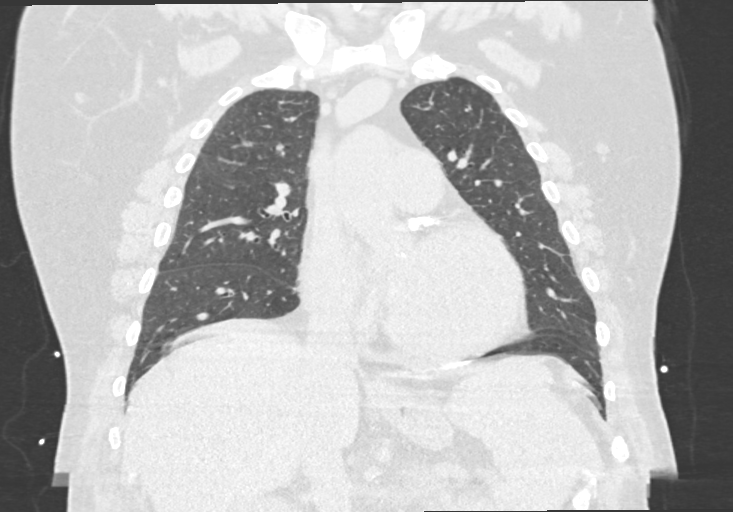

[14 of 36 positions shown; findings below may reference images not displayed]

FINDINGS: Evaluation of this exam is limited in the absence of intravenous
contrast. Evaluation is also limited due to respiratory motion
artifact.

Cardiovascular: There is borderline cardiomegaly. No pericardial
effusion. There is advanced coronary vascular calcification with
involvement of the left main, LAD, left circumflex artery, and RCA.
There is atherosclerotic calcification of the thoracic aorta and
aortic arch. Slight prominence of the pulmonary arteries suggestive
of underlying pulmonary hypertension.

Mediastinum/Nodes: No hilar or mediastinal adenopathy. Multiple
small mediastinal lymph nodes noted. A right paratracheal lymph node
measures 10 mm in short axis. Evaluation of the hila however is
limited in the absence of intravenous contrast. The esophagus is
grossly unremarkable.

Lungs/Pleura: Mild paraseptal emphysema. There are small bilateral
pleural effusions, right greater than left. Minimal bibasilar
compressive atelectasis noted. There is no focal consolidation.
There is mild cephalization with interstitial prominence consistent
with mild pulmonary edema . Superimposed pneumonia is not excluded.
Clinical correlation is recommended. The central airways are patent.

Upper Abdomen: Suboptimally visualized and evaluated due to
respiratory motion artifact.

Musculoskeletal: Mild degenerative changes of the spine. No acute
osseous pathology.
IMPRESSION: 1. Borderline cardiomegaly with findings of CHF and small bilateral
pleural effusions. Superimposed pneumonia is not excluded. Clinical
correlation is recommended.
2. Advanced multi vessel coronary vascular calcification as well as
atherosclerotic disease of the aorta.
3. Aortic Atherosclerosis (KAL3O-Y3M.M) and Emphysema (KAL3O-XOV.K).

## 2019-10-27 ENCOUNTER — Ambulatory Visit: Payer: Medicare Other | Admitting: Family

## 2019-11-02 NOTE — Progress Notes (Signed)
Office Visit    Patient Name: [name] [name] Date of Encounter: 11/03/2019  Primary Care Provider:  [name], MD Primary Cardiologist:  Ida Rogue, MD Electrophysiologist:  None   Chief Complaint    [name] [name] is a 59 y.o. male with a hx of HFrEF, tobacco abuse, HTN, PAD presents today for folow up of HFrEF   Past Medical History    Past Medical History:  Diagnosis Date  . Arthritis   . Asthma   . Atherosclerosis   . BPH (benign prostatic hyperplasia)   . Carotid arterial disease ([place])   . Charcot's joint of foot, right   . Chronic combined systolic (congestive) and diastolic (congestive) heart failure ([place])    a. 02/2013 EF 50% by LV gram; b. 04/2017 Echo: EF 25-30%. diff HK. Gr2 DD. Mod dil LA/RV. PASP 33mmHg.  [name] COPD (chronic obstructive pulmonary disease) ([place])   . Coronary artery disease    a. 2013 S/P PCI of LAD [facility]);  b. 03/2013 PCI: RCA 90p ( 2.5 x 23 mm DES); c. 02/2014 Cath: patent RCA stent->Med Rx; d. 04/2017 Cath: LM nl, LAd 30ost/p, 51m/d, D1 80ost, LCX 90p/m, OM1 85, RCA 70/20p, 57m, 70d->Referred for CT Surg-felt to be poor candidate.  . Diabetic neuropathy ([place])   . Diabetic ulcer of right foot ([place])   . Hernia   . Hyperlipidemia   . Hypertension   . Ischemic cardiomyopathy    a. 04/2017 Echo: EF 25-30%; b. 08/2017 Cardiac MRI ([facility]): EF 19%, sev glob HK. RVEF 20%, mod BAE, triv MR, mild-mod TR. Basal lateral subendocardial infarct (viable), inf/infsept, dist septal ischemia, basal to mid lat peri-infarct ischemia.  . Morbid obesity ([place])   . Neuropathy   . PAD (peripheral artery disease) ([place])    a. Followed by Dr. Lucky Cowboy; b. 08/2010 Periph Angio: RSFA 70-80p (6X60 self-expanding stent); c. 09/2010 Periph Angio: L SFA 100p (7x unknown length self-expanding stent); d. 06/2015 Periph Angio: R SFA short segment occlusion (6x12 self-expanding stent).  . Restless leg syndrome   . Sleep apnea   . Subclavian artery stenosis, right ([place])   . Syncope and  collapse   . Tobacco use    a. 75+ yr hx - still smoking 1ppd, down from 2 ppd.  . Type II diabetes mellitus ([place])   . Varicose vein    Past Surgical History:  Procedure Laterality Date  . ABDOMINAL AORTAGRAM N/A 06/23/2013   Procedure: ABDOMINAL Maxcine Ham;  Surgeon: Wellington Hampshire, MD;  Location: [facility];  Service: Cardiovascular;  Laterality: N/A;  . AMPUTATION Right 04/01/2018   Procedure: AMPUTATION BELOW KNEE;  Surgeon: Algernon Huxley, MD;  Location: [facility] ORS;  Service: Vascular;  Laterality: Right;  . APPENDECTOMY    . CARDIAC CATHETERIZATION  10/14   [facility]; X1 STENT PROXIMAL RCA  . CARDIAC CATHETERIZATION  02/2010   [facility]  . CARDIAC CATHETERIZATION  02/21/2014   [facility]  . CLAVICLE SURGERY    . HERNIA REPAIR    . IRRIGATION AND DEBRIDEMENT FOOT Right 01/04/2018   Procedure: IRRIGATION AND DEBRIDEMENT FOOT;  Surgeon: Samara Deist, DPM;  Location: [facility] OR[facility];  Service: Podiatry;  Laterality: Right;  . LEFT HEART CATH AND CORS/GRAFTS ANGIOGRAPHY N/A 04/28/2017   Procedure: LEFT HEART CATH AND CORONARY ANGIOGRAPHY;  Surgeon: Wellington Hampshire, MD;  Location: [facility];  Service: Cardiovascular;  Laterality: N/A;  . LOWER EXTREMITY ANGIOGRAPHY Right 03/03/2018   Procedure: LOWER EXTREMITY ANGIOGRAPHY;  Surgeon: Delana Meyer,  Dolores Lory, MD;  Location: [place] [facility];  Service: Cardiovascular;  Laterality: Right;  . LOWER EXTREMITY ANGIOGRAPHY Left 08/24/2018   Procedure: LOWER EXTREMITY ANGIOGRAPHY;  Surgeon: Algernon Huxley, MD;  Location: [place] [facility];  Service: Cardiovascular;  Laterality: Left;  . LOWER EXTREMITY ANGIOGRAPHY Left 06/14/2019   Procedure: LOWER EXTREMITY ANGIOGRAPHY;  Surgeon: Algernon Huxley, MD;  Location: [place] [facility];  Service: Cardiovascular;  Laterality: Left;  . LOWER EXTREMITY ANGIOGRAPHY Left 08/02/2019   Procedure: LOWER EXTREMITY ANGIOGRAPHY;  Surgeon: Algernon Huxley, MD;  Location: [place] [facility];  Service: Cardiovascular;  Laterality:  Left;  . PERIPHERAL ARTERIAL STENT GRAFT     x2 left/right  . PERIPHERAL VASCULAR BALLOON ANGIOPLASTY Right 01/06/2018   Procedure: PERIPHERAL VASCULAR BALLOON ANGIOPLASTY;  Surgeon: Katha Cabal, MD;  Location: [place] [facility];  Service: Cardiovascular;  Laterality: Right;  . PERIPHERAL VASCULAR CATHETERIZATION N/A 07/06/2015   Procedure: Abdominal Aortogram w/Lower Extremity;  Surgeon: Algernon Huxley, MD;  Location: [place] [facility];  Service: Cardiovascular;  Laterality: N/A;  . PERIPHERAL VASCULAR CATHETERIZATION  07/06/2015   Procedure: Lower Extremity Intervention;  Surgeon: Algernon Huxley, MD;  Location: [place] [facility];  Service: Cardiovascular;;   Allergies  Allergies  Allergen Reactions  . Dulaglutide Anaphylaxis, Diarrhea and Hives  . Other Itching    Skin itching associated with nitro patch  . Prednisone Rash   History of Present Illness    [name] [name] is a 59 y.o. male with a hx of CAD, chronic combined systolic and diastolic heart failure, pulmonary hypertension, COPD, tobacco abuse, HTN, HLD, obesity, OSA noncompliant with CPAP, DM2 complicated by neuropathy and LE ulceration, PAD followed by vascuar, s/p R BKA, carotid artery disease last seen by Darylene Price 05/28/19.  Follows with Dr. Lucky Cowboy of Vascular. Intervention 06/14/19 involving L common iliac artery/external iliac artery stent with L SFA, popliteal, TP trunk and PTA angioplasties. Following with [facility] for L heel wound. He was started on abx. He had recent ABIs and TBIs.   Clinic visit 07/02/19 his Toprol was increased to 25mg  daily due to tachycardia.   08/02/19 vascular aortogram with patent renal arteries, aorta mild-moderately irregular but not stenotic, stents to L iliac artery patent, LLE with previous stents patent.  ED visit 08/30/19 with volume overload and LE cellulitis treated with IV lasix and abx. Admitted 09/27/19 with cellulitis anc acute on chronic heart failure. Echo 09/28/19 LVEF 20-25%,  severe global hypokinesis, LV severely dilated, mild LVH, RV systolic function moderately reduced, RV moderately enlarged, moderately elevated PASP, LA mildly dilated, RA moderately dilated, mild-moderate TR, RA pressure 50mmHg.  Seen in follow up 10/04/19 with recommendation for Metolazone 2.5mg  three days per week and to continue Torsemide 40mg  BID.   Tells me he misunderstood directions and took metolazone every day until he ran out.  Over the past few weeks he has been taking 60 mg metolazone in the morning but not in the afternoon.  Continues to take potassium 10 mg daily.  He is down 12 pounds over the last month.  Does not weigh himself routinely at home.  Reports no shortness of breath at rest, improved DOE, no LE edema.  Reports no orthopnea, PND.  Eats out most of the time at a fast food restaurants.  Endorses eating lots of hamburgers or fried fish sandwiches.  Does try to avoid fries.  EKGs/Labs/Other Studies Reviewed:   The following studies were reviewed today:  Cardiac  cath: 04/2017 1.  Significant three-vessel coronary artery disease with patent stent in the RCA and LAD.  There is significant proximal RCA disease before the stent as well as diffuse mid and distal disease in a vessel that appears to be about 2.5 mm in diameter.  There is significant bifurcation stenosis in the proximal left circumflex with a large OM1.  The LAD has moderate disease. The coronary arteries are moderately calcified and diffusely diseased throughout. 2.  Left ventricular angiography was not performed.  EF was moderately to severely reduced by echo. 3.  Severely elevated left ventricular end-diastolic pressure at 34 mmHg.  2D Echo 11/06/2018: 1. The left ventricle has severely reduced systolic function, with an ejection fraction of 20-25%. The cavity size was moderately dilated. Indeterminate diastolic filling due to E-A fusion. Left ventricular diffuse hypokinesis.  2. The right ventricle has severely  reduced systolic function. The cavity was mildly enlarged. There is no increase in right ventricular wall thickness. Right ventricular systolic pressure is moderately elevated with an estimated pressure of 59.6 mmHg.  3. Left atrial size was mildly dilated.  4. Right atrial size was mildly dilated.  5. The mitral valve is degenerative. Mild thickening of the mitral valve leaflet. Mild calcification of the mitral valve leaflet. There is mild mitral annular calcification present.  6. The tricuspid valve is degenerative. Tricuspid valve regurgitation is moderate.  7. The aortic valve was not well visualized. Moderate thickening of the aortic valve. Moderate calcification of the aortic valve.  8. The inferior vena cava was dilated in size with <50% respiratory variability.  EKG:  EKG is ordered today.  The ekg ordered today demonstrates NSR 94 bpm.   Recent Labs: 09/17/2019: ALT 10 09/26/2019: B Natriuretic Peptide 1,342.0 09/28/2019: Magnesium 1.7 09/29/2019: BUN 15; Creatinine, Ser 1.05; Hemoglobin 10.7; Platelets 148; Potassium 3.7; Sodium 138  Recent Lipid Panel    Component Value Date/Time   CHOL 80 11/09/2018 0308   CHOL 211 (H) 04/05/2013 0520   TRIG 42 11/09/2018 0308   TRIG 321 (H) 04/05/2013 0520   HDL 26 (L) 11/09/2018 0308   HDL 34 (L) 04/05/2013 0520   CHOLHDL 3.1 11/09/2018 0308   VLDL 8 11/09/2018 0308   VLDL 64 (H) 04/05/2013 0520   LDLCALC 46 11/09/2018 0308   LDLCALC 113 (H) 04/05/2013 0520    Home Medications   Current Meds  Medication Sig  . albuterol (VENTOLIN HFA) 108 (90 Base) MCG/ACT inhaler Inhale into the lungs every 6 (six) hours as needed for wheezing or shortness of breath.  [name] aspirin EC 81 MG tablet Take 1 tablet (81 mg total) by mouth daily.  [name] atorvastatin (LIPITOR) 80 MG tablet Take 80 mg by mouth daily.  . calcium carbonate (TUMS - DOSED IN MG ELEMENTAL CALCIUM) 500 MG chewable tablet Chew 4 tablets by mouth daily as needed for indigestion or heartburn.    . clopidogrel (PLAVIX) 75 MG tablet Take 75 mg by mouth daily.   . collagenase (SANTYL) ointment Apply 1 application topically daily.  . DULoxetine (CYMBALTA) 20 MG capsule Take 20 mg by mouth daily.  [name] ezetimibe (ZETIA) 10 MG tablet Take 1 tablet (10 mg total) by mouth daily.  [name] gabapentin (NEURONTIN) 800 MG tablet Take 800 mg by mouth 4 (four) times daily.   . Insulin Glargine (LANTUS SOLOSTAR) 100 UNIT/ML Solostar Pen Inject 30 Units into the skin at bedtime. (Patient taking differently: Inject 20-40 Units into the skin 2 (two) times daily. 40 units in the  morning and 20 units at night)  . insulin lispro (HUMALOG) 100 UNIT/ML injection Sliding Scale Insulin before meals using Novolog If BS is 0 to 70 no insulin and eat/glucose tablets/glucose get If BS 71 to 150 great! If BS 151 to 200, 2 Units Novolog If BS 201 to 250, 4 Units Novolog If BS 251 to 300, 6 Units Novolog If BS 301 to 350, 8 Units Novolog If BS 351 to 400, 10 Units Novolog If BS greater than 400, take 10 Units and Contact our office during office hour  . ipratropium-albuterol (DUONEB) 0.5-2.5 (3) MG/3ML SOLN Inhale 3 mLs into the lungs every 6 (six) hours as needed (wheezing/sob).   . isosorbide mononitrate (IMDUR) 30 MG 24 hr tablet Take 1 tablet (30 mg total) by mouth daily.  . metFORMIN (GLUCOPHAGE-XR) 500 MG 24 hr tablet Take 1,000 mg by mouth 2 (two) times daily.   . metolazone (ZAROXOLYN) 2.5 MG tablet Take 1 tablet (2.5 mg total) by mouth as needed (for weight gain of 3lb overnight or 5lb in 1 week take 1 tablet before morning Torsemide).  . metoprolol succinate (TOPROL-XL) 25 MG 24 hr tablet Take 1 tablet (25 mg total) by mouth daily.  . Multiple Vitamin (MULTI-VITAMINS) TABS Take 1 tablet by mouth daily.   . nitroGLYCERIN (NITROSTAT) 0.4 MG SL tablet Place 1 tablet (0.4 mg total) under the tongue every 5 (five) minutes as needed for chest pain.  [name] omeprazole (PRILOSEC) 40 MG capsule Take 40 mg by mouth daily.  . Oxycodone  HCl 10 MG TABS Take 10 mg by mouth every 6 (six) hours as needed (pain).   . potassium chloride (KLOR-CON) 10 MEQ tablet Take 1 tablet (10 mEq total) by mouth as directed. Take 1 tablet twice a day on days with metolazone take 4 tablets (40 mEq)  . sacubitril-valsartan (ENTRESTO) 24-26 MG Take 1 tablet by mouth 2 (two) times daily.  [name] spironolactone (ALDACTONE) 25 MG tablet Take 1 tablet (25 mg total) by mouth daily.  [name] torsemide (DEMADEX) 20 MG tablet Take 3 tablets (60 mg total) by mouth daily.  . traZODone (DESYREL) 150 MG tablet Take 150 mg by mouth at bedtime.  . [DISCONTINUED] metolazone (ZAROXOLYN) 2.5 MG tablet Take 1 tablet (2.5 mg total) by mouth 3 (three) times a week.  . [DISCONTINUED] torsemide (DEMADEX) 20 MG tablet Take 2 tablets (40 mg total) by mouth 2 (two) times daily. 40mg  AM/ 20mg  PM (Patient taking differently: Take 40 mg by mouth 2 (two) times daily. )  . [DISCONTINUED] torsemide (DEMADEX) 20 MG tablet Take 3 tablets (60 mg total) by mouth daily.      Review of Systems   Review of Systems  Constitution: Negative for chills, fever and malaise/fatigue.  Cardiovascular: Positive for dyspnea on exertion. Negative for chest pain, leg swelling, near-syncope, orthopnea, palpitations and syncope.  Respiratory: Negative for cough, shortness of breath and wheezing.   Skin: Positive for poor wound healing.  Gastrointestinal: Negative for nausea and vomiting.  Neurological: Negative for dizziness, light-headedness and weakness.   All other systems reviewed and are otherwise negative except as noted above.  Physical Exam    VS:  BP 100/60 (BP Location: Right Arm, Patient Position: Sitting, Cuff Size: Normal)   Pulse 94   Ht 5\' 7"  (1.702 m)   Wt 198 lb (89.8 kg)   SpO2 94%   BMI 31.01 kg/m  , BMI Body mass index is 31.01 kg/m. GEN: Well nourished, well developed, in no acute distress.  HEENT: normal. Neck: Supple, no JVD, carotid bruits, or masses. Cardiac: teachycardic,  no murmurs, rubs, or gallops. No clubbing, cyanosis. LE edema unable to be accurately evaluated due to LLE Unna boot.  Radials 2+ and equal bilaterally.  Respiratory:  Respirations regular and unlabored. Rhonchi noted to bilateral lower lobes.  GI: Soft, nontender, nondistended, BS + x 4. MS: No deformity or atrophy. RLE prosthesis is present. Skin: Warm and dry, no rash. LLE with Unna boot.  Neuro:  Strength and sensation are intact. Psych: Normal affect.  Assessment & Plan    1. CAD - No anginal symptoms.  No indication for ischemic evaluation at this time. Previously declined CABG. GDMT includes aspirin, Plavix, beta-blocker, statin, Imdur, PRN nitroglycerin.   2. Chronic systolic CHF/Pulmonary HTN - Down 12 lbs from clinic visit 1 month prior. Well compensated on exam. At last clinic visit Metolazone 2.5mg  added every other day, but he took daily until running out. Has been taking Torsemide 60mg  daily instead of 40mg  BID.   GDMT: Metoprolol Succinate 25mg  daily, Entresto 24-26mg  BID, Torsemide 60mg  daily, Spironolactone 25mg  daily, Kdur 20mEq daily. Doubtful Delene Loll will be able to be titrated to max dose due to hypotension.   BMP today.   Continue low-sodium diet. Majority of meals eaten out - discussed need to avoid fast food. Provided on education on ways to avoid sodium when eating out.  Recommend fluid restriction less than 2 L.  Recommended daily weight. Rx Metolazone 2.5mg  as needed for weight gain of 3lb overnight or 5lb in 1 week.   3. COPD -No signs of acute exacerbation.   4. Tobacco abuse - Not interested in quitting. Smoking cessation encouraged. Recommend utilization of [id number].  5. HTN -BP well controlled on low end of normal.  No lightheadedness, dizziness.  Continue present antihypertensive regimen.  6. HLD -LDL goal less than 70.  Continue atorvastatin 80 mg daily and Zetia 10 mg daily.  7. DM2 - Follows with primary care.  Contributory to poor wound  healing, as below  8. PAD -Follows with vascular surgery.  06/14/2019 iliac artery stent and multiple LLE angioplasty.  He is s/p remote R BKA.    Disposition: Follow up in 2 week(s) with Darylene Price, NP as previously scheduled and in 3 months with Dr. Rockey Situ.    Loel Dubonnet, NP 11/03/2019, 2:11 PM

## 2019-11-03 ENCOUNTER — Ambulatory Visit (INDEPENDENT_AMBULATORY_CARE_PROVIDER_SITE_OTHER): Payer: Medicare Other | Admitting: Family

## 2019-11-03 ENCOUNTER — Other Ambulatory Visit: Payer: Self-pay

## 2019-11-03 ENCOUNTER — Encounter: Payer: Self-pay | Admitting: Family

## 2019-11-03 VITALS — BP 100/60 | HR 94 | Ht 67.0 in | Wt 198.0 lb

## 2019-11-03 DIAGNOSIS — Z720 Tobacco use: Secondary | ICD-10-CM

## 2019-11-03 DIAGNOSIS — I739 Peripheral vascular disease, unspecified: Secondary | ICD-10-CM | POA: Diagnosis not present

## 2019-11-03 DIAGNOSIS — E785 Hyperlipidemia, unspecified: Secondary | ICD-10-CM | POA: Diagnosis not present

## 2019-11-03 DIAGNOSIS — I5022 Chronic systolic (congestive) heart failure: Secondary | ICD-10-CM

## 2019-11-03 MED ORDER — TORSEMIDE 20 MG PO TABS: 60.0000 mg | ORAL_TABLET | Freq: Every day | ORAL | Status: DC

## 2019-11-03 MED ORDER — METOLAZONE 2.5 MG PO TABS: 2.5000 mg | ORAL_TABLET | ORAL | 1 refills | Status: DC | PRN

## 2019-11-03 MED ORDER — TORSEMIDE 20 MG PO TABS: 60.0000 mg | ORAL_TABLET | Freq: Every day | ORAL | 1 refills | Status: DC

## 2019-11-03 NOTE — Patient Instructions (Addendum)
Medication Instructions:  Your physician has recommended you make the following change in your medication:   CONTINUE Torsemide 60mg  in the morning  START Metolazone AS NEEDED for weight gain of 3lbs overnight or 5lbs in 1 week take 1 tablet before your morning Torsemide  *If you need a refill on your cardiac medications before your next appointment, please call your pharmacy*  Lab Work: Your physician recommends that you return for lab work today: BMET  If you have labs (blood work) drawn today and your tests are completely normal, you will receive your results only by: [name] MyChart Message (if you have MyChart) OR . A paper copy in the mail If you have any lab test that is abnormal or we need to change your treatment, we will call you to review the results.   Testing/Procedures: Your EKG today was stable.   Follow-Up: At [facility], you and your health needs are our priority.  As part of our continuing mission to provide you with exceptional heart care, we have created designated Provider Care Teams.  These Care Teams include your primary Cardiologist (physician) and Advanced Practice Providers (APPs -  Physician Assistants and Nurse Practitioners) who all work together to provide you with the care you need, when you need it.  We recommend signing up for the patient portal called "MyChart".  Sign up information is provided on this After Visit Summary.  MyChart is used to connect with patients for Virtual Visits (Telemedicine).  Patients are able to view lab/test results, encounter notes, upcoming appointments, etc.  Non-urgent messages can be sent to your provider as well.   To learn more about what you can do with MyChart, go to [email or web address].    Your next appointment:   On June 7th with Darylene Price, as scheduled.   Other Instructions   Heart Failure Eating Plan Heart failure, also called congestive heart failure, occurs when your heart does not pump blood well enough  to meet your body's needs for oxygen-rich blood. Heart failure is a long-term (chronic) condition. Living with heart failure can be challenging. However, following your health care provider's instructions about a healthy lifestyle and working with a diet and nutrition specialist (dietitian) to choose the right foods may help to improve your symptoms. What are tips for following this plan? Reading food labels  Check food labels for the amount of sodium per serving. Choose foods that have less than 140 mg (milligrams) of sodium in each serving.  Check food labels for the number of calories per serving. This is important if you need to limit your daily calorie intake to lose weight.  Check food labels for the serving size. If you eat more than one serving, you will be eating more sodium and calories than what is listed on the label.  Look for foods that are labeled as "sodium-free," "very low sodium," or "low sodium." ? Foods labeled as "reduced sodium" or "lightly salted" may still have more sodium than what is recommended for you. Cooking  Avoid adding salt when cooking. Ask your health care provider or dietitian before using salt substitutes.  Season food with salt-free seasonings, spices, or herbs. Check the label of seasoning mixes to make sure they do not contain salt.  Cook with heart-healthy oils, such as olive, canola, soybean, or sunflower oil.  Do not fry foods. Cook foods using low-fat methods, such as baking, boiling, grilling, and broiling.  Limit unhealthy fats when cooking by: ? Removing the skin from poultry, such  as chicken. ? Removing all visible fats from meats. ? Skimming the fat off from stews, soups, and gravies before serving them. Meal planning   Limit your intake of: ? Processed, canned, or pre-packaged foods. ? Foods that are high in trans fat, such as fried foods. ? Sweets, desserts, sugary drinks, and other foods with added sugar. ? Full-fat dairy products,  such as whole milk.  Eat a balanced diet that includes: ? 4-5 servings of fruit each day and 4-5 servings of vegetables each day. At each meal, try to fill half of your plate with fruits and vegetables. ? Up to 6-8 servings of whole grains each day. ? Up to 2 servings of lean meat, poultry, or fish each day. One serving of meat is equal to 3 oz. This is about the same size as a deck of cards. ? 2 servings of low-fat dairy each day. ? Heart-healthy fats. Healthy fats called omega-3 fatty acids are found in foods such as flaxseed and cold-water fish like sardines, salmon, and mackerel.  Aim to eat 25-35 g (grams) of fiber a day. Foods that are high in fiber include apples, broccoli, carrots, beans, peas, and whole grains.  Do not add salt or condiments that contain salt (such as soy sauce) to foods before eating.  When eating at a restaurant, ask that your food be prepared with less salt or no salt, if possible.  Try to eat 2 or more vegetarian meals each week.  Eat more home-cooked food and eat less restaurant, buffet, and fast food. General information  Do not eat more than 2,300 mg of salt (sodium) a day. The amount of sodium that is recommended for you may be lower, depending on your condition.  Maintain a healthy body weight as directed. Ask your health care provider what a healthy weight is for you. ? Check your weight every day. ? Work with your health care provider and dietitian to make a plan that is right for you to lose weight or maintain your current weight.  Limit how much fluid you drink. Ask your health care provider or dietitian how much fluid you can have each day.  Limit or avoid alcohol as told by your health care provider or dietitian. Recommended foods The items listed may not be a complete list. Talk with your dietitian about what dietary choices are best for you. Fruits All fresh, frozen, and canned fruits. Dried fruits, such as raisins, prunes, and  cranberries. Vegetables All fresh vegetables. Vegetables that are frozen without sauce or added salt. Low-sodium or sodium-free canned vegetables. Grains Bread with less than 80 mg of sodium per slice. Whole-wheat pasta, quinoa, and brown rice. Oats and oatmeal. Barley. Tiburones. Grits and cream of wheat. Whole-grain and whole-wheat cold cereal. Meats and other protein foods Lean cuts of meat. Skinless chicken and [place]. Fish with high omega-3 fatty acids, such as salmon, sardines, and other cold-water fishes. Eggs. Dried beans, peas, and edamame. Unsalted nuts and nut butters. Dairy Low-fat or nonfat (skim) milk and dried milk. Rice milk, soy milk, and almond milk. Low-fat or nonfat yogurt. Small amounts of reduced-sodium block cheese. Low-sodium cottage cheese. Fats and oils Olive, canola, soybean, flaxseed, or sunflower oil. Avocado. Sweets and desserts Apple sauce. Granola bars. Sugar-free pudding and gelatin. Frozen fruit bars. Seasoning and other foods Fresh and dried herbs. Lemon or lime juice. Vinegar. Low-sodium ketchup. Salt-free marinades, salad dressings, sauces, and seasonings. The items listed above may not be a complete list of foods  and beverages you can eat. Contact a dietitian for more information. Foods to avoid The items listed may not be a complete list. Talk with your dietitian about what dietary choices are best for you. Fruits Fruits that are dried with sodium-containing preservatives. Vegetables Canned vegetables. Frozen vegetables with sauce or seasonings. Creamed vegetables. [place] fries. Onion rings. Pickled vegetables and sauerkraut. Grains Bread with more than 80 mg of sodium per slice. Hot or cold cereal with more than 140 mg sodium per serving. Salted pretzels and crackers. Pre-packaged breadcrumbs. Bagels, croissants, and biscuits. Meats and other protein foods Ribs and chicken wings. Bacon, ham, pepperoni, bologna, salami, and packaged luncheon meats. Hot  dogs, bratwurst, and sausage. Canned meat. Smoked meat and fish. Salted nuts and seeds. Dairy Whole milk, half-and-half, and cream. Buttermilk. Processed cheese, cheese spreads, and cheese curds. Regular cottage cheese. Feta cheese. Shredded cheese. String cheese. Fats and oils Butter, lard, shortening, ghee, and bacon fat. Canned and packaged gravies. Seasoning and other foods Onion salt, garlic salt, table salt, and sea salt. Marinades. Regular salad dressings. Relishes, pickles, and olives. Meat flavorings and tenderizers, and bouillon cubes. Horseradish, ketchup, and mustard. Worcestershire sauce. Teriyaki sauce, soy sauce (including reduced sodium). Hot sauce and Tabasco sauce. Steak sauce, fish sauce, oyster sauce, and cocktail sauce. Taco seasonings. Barbecue sauce. Tartar sauce. The items listed above may not be a complete list of foods and beverages you should avoid. Contact a dietitian for more information. Summary  A heart failure eating plan includes changes that limit your intake of sodium and unhealthy fat, and it may help you lose weight or maintain a healthy weight. Your health care provider may also recommend limiting how much fluid you drink.  Most people with heart failure should eat no more than 2,300 mg of salt (sodium) a day. The amount of sodium that is recommended for you may be lower, depending on your condition.  Contact your health care provider or dietitian before making any major changes to your diet. This information is not intended to replace advice given to you by your health care provider. Make sure you discuss any questions you have with your health care provider. Document Revised: 07/23/2018 Document Reviewed: 10/11/2016 Elsevier Patient Education  [place].

## 2019-11-04 LAB — BASIC METABOLIC PANEL
BUN/Creatinine Ratio: 23 — ABNORMAL HIGH (ref 9–20)
BUN: 25 mg/dL — ABNORMAL HIGH (ref 6–24)
CO2: 27 mmol/L (ref 20–29)
Calcium: 9.6 mg/dL (ref 8.7–10.2)
Chloride: 94 mmol/L — ABNORMAL LOW (ref 96–106)
Creatinine, Ser: 1.08 mg/dL (ref 0.76–1.27)
GFR calc Af Amer: 88 mL/min/{1.73_m2} (ref >59)
GFR calc non Af Amer: 76 mL/min/{1.73_m2} (ref >59)
Glucose: 111 mg/dL — ABNORMAL HIGH (ref 65–99)
Potassium: 5.1 mmol/L (ref 3.5–5.2)
Sodium: 134 mmol/L (ref 134–144)

## 2019-11-06 ENCOUNTER — Other Ambulatory Visit: Payer: Self-pay | Admitting: Family

## 2019-11-10 ENCOUNTER — Telehealth (HOSPITAL_COMMUNITY): Payer: Self-pay

## 2019-11-10 NOTE — Telephone Encounter (Signed)
Attempted to contact, no answer.  Left message and reminded him of appt at [facility] clinic on Monday.  Will attempt to contact back.   [place] [phone]

## 2019-11-13 NOTE — Progress Notes (Signed)
Patient ID: [name] [name], male    DOB: 08/20/1960, 59 y.o.   MRN: [id number]  HPI  Mr Noga is a 59 y/o male with a history of obstructive sleep apnea, PVD, PAD, HTN, hyperlipidemia, DM, CAD, COPD, BPH, asthma, current tobacco use and chronic heart failure.   Echo report from 09/29/19 reviewed and showed an EF of 20-25% along with moderately elevated PA pressure, mild MR and mild/moderate TR. Echo report from 11/06/2018 reviewed and showed an EF of 20-25% along with moderate TR and a PA pressure of 59.6 mmHg. Echo report from 04/25/17 reviewed and shows an EF of 25-30% along with a PA pressure of 34 mm Hg. EF has declined from 55-60% back in 2016.   Cardiac catheterization done 04/28/17 showed significant three-vessel disease with a patient stent in the RCA and LAD. Significant proximal RCS disease and the stent as well as diffuse mid and distal disease. LAD has moderate disease. Severely elevated left ventricular end-diastolic pressure at 34 mmHg. Possible CABG in the future with optimizing medical management. Stress done in 2015.  Admitted 09/27/19 due to left lower leg cellulitis and acute on chronic HF. Cardiology and wound consult obtained. Given IV lasix and antibiotics with transition to oral medications. Venous doppler negative for DVT. Discharged after 3 days. Was in the ED 09/22/19 but left without being seen. Was in the ED 09/17/19 but left without being seen. Was in the ED 09/10/19 due to pedal edema. Admits to not taking his medications. Given IV lasix with improvement in symptoms and he was released. Was in the ED 3/22/210 due to HF exacerbation. Nebulizer given for wheezing. CT and [place] were negative for clots. Mildly elevated troponin thought to be due to demand ischemia. IV antibiotics given for LLL cellulitis. Patient left AMA  He presents today for a follow-up visit with a chief complaint of wheezing. He describes this as chronic in nature having been present for several years. He says that  this occurs intermittently. He has associated back pain and headaches along with this. He denies any difficulty sleeping, abdominal distention, palpitations, leg edema, chest pain, shortness of breath, cough, dizziness, fatigue or weight gain.   Says that he mistakenly took metolazone daily for numerous days but had lab work at cardiology office which looked ok. Says that he hasn't needed metolazone anymore and overall feels good.   Past Medical History:  Diagnosis Date  . Arthritis   . Asthma   . Atherosclerosis   . BPH (benign prostatic hyperplasia)   . Carotid arterial disease ([place])   . Charcot's joint of foot, right   . Chronic combined systolic (congestive) and diastolic (congestive) heart failure ([place])    a. 02/2013 EF 50% by LV gram; b. 04/2017 Echo: EF 25-30%. diff HK. Gr2 DD. Mod dil LA/RV. PASP 43mmHg.  [name] COPD (chronic obstructive pulmonary disease) ([place])   . Coronary artery disease    a. 2013 S/P PCI of LAD [facility]);  b. 03/2013 PCI: RCA 90p ( 2.5 x 23 mm DES); c. 02/2014 Cath: patent RCA stent->Med Rx; d. 04/2017 Cath: LM nl, LAd 30ost/p, 7m/d, D1 80ost, LCX 90p/m, OM1 85, RCA 70/20p, 21m, 70d->Referred for CT Surg-felt to be poor candidate.  . Diabetic neuropathy ([place])   . Diabetic ulcer of right foot ([place])   . Hernia   . Hyperlipidemia   . Hypertension   . Ischemic cardiomyopathy    a. 04/2017 Echo: EF 25-30%; b. 08/2017 Cardiac MRI ([facility]): EF 19%, sev glob  HK. RVEF 20%, mod BAE, triv MR, mild-mod TR. Basal lateral subendocardial infarct (viable), inf/infsept, dist septal ischemia, basal to mid lat peri-infarct ischemia.  . Morbid obesity ([place])   . Neuropathy   . PAD (peripheral artery disease) ([place])    a. Followed by Dr. Lucky Cowboy; b. 08/2010 Periph Angio: RSFA 70-80p (6X60 self-expanding stent); c. 09/2010 Periph Angio: L SFA 100p (7x unknown length self-expanding stent); d. 06/2015 Periph Angio: R SFA short segment occlusion (6x12 self-expanding stent).  . Restless leg syndrome   .  Sleep apnea   . Subclavian artery stenosis, right ([place])   . Syncope and collapse   . Tobacco use    a. 75+ yr hx - still smoking 1ppd, down from 2 ppd.  . Type II diabetes mellitus ([place])   . Varicose vein    Past Surgical History:  Procedure Laterality Date  . ABDOMINAL AORTAGRAM N/A 06/23/2013   Procedure: ABDOMINAL Maxcine Ham;  Surgeon: Wellington Hampshire, MD;  Location: [place] [facility];  Service: Cardiovascular;  Laterality: N/A;  . AMPUTATION Right 04/01/2018   Procedure: AMPUTATION BELOW KNEE;  Surgeon: Algernon Huxley, MD;  Location: [facility] ORS;  Service: Vascular;  Laterality: Right;  . APPENDECTOMY    . CARDIAC CATHETERIZATION  10/14   [facility]; X1 STENT PROXIMAL RCA  . CARDIAC CATHETERIZATION  02/2010   [facility]  . CARDIAC CATHETERIZATION  02/21/2014   [facility]  . CLAVICLE SURGERY    . HERNIA REPAIR    . IRRIGATION AND DEBRIDEMENT FOOT Right 01/04/2018   Procedure: IRRIGATION AND DEBRIDEMENT FOOT;  Surgeon: Samara Deist, DPM;  Location: [facility] ORS;  Service: Podiatry;  Laterality: Right;  . LEFT HEART CATH AND CORS/GRAFTS ANGIOGRAPHY N/A 04/28/2017   Procedure: LEFT HEART CATH AND CORONARY ANGIOGRAPHY;  Surgeon: Wellington Hampshire, MD;  Location: [place] CV [facility];  Service: Cardiovascular;  Laterality: N/A;  . LOWER EXTREMITY ANGIOGRAPHY Right 03/03/2018   Procedure: LOWER EXTREMITY ANGIOGRAPHY;  Surgeon: Katha Cabal, MD;  Location: [place] [facility];  Service: Cardiovascular;  Laterality: Right;  . LOWER EXTREMITY ANGIOGRAPHY Left 08/24/2018   Procedure: LOWER EXTREMITY ANGIOGRAPHY;  Surgeon: Algernon Huxley, MD;  Location: [place] [facility];  Service: Cardiovascular;  Laterality: Left;  . LOWER EXTREMITY ANGIOGRAPHY Left 06/14/2019   Procedure: LOWER EXTREMITY ANGIOGRAPHY;  Surgeon: Algernon Huxley, MD;  Location: [place] [facility];  Service: Cardiovascular;  Laterality: Left;  . LOWER EXTREMITY ANGIOGRAPHY Left 08/02/2019   Procedure: LOWER EXTREMITY ANGIOGRAPHY;  Surgeon: Algernon Huxley, MD;   Location: [place] [facility];  Service: Cardiovascular;  Laterality: Left;  . PERIPHERAL ARTERIAL STENT GRAFT     x2 left/right  . PERIPHERAL VASCULAR BALLOON ANGIOPLASTY Right 01/06/2018   Procedure: PERIPHERAL VASCULAR BALLOON ANGIOPLASTY;  Surgeon: Katha Cabal, MD;  Location: [place] [facility];  Service: Cardiovascular;  Laterality: Right;  . PERIPHERAL VASCULAR CATHETERIZATION N/A 07/06/2015   Procedure: Abdominal Aortogram w/Lower Extremity;  Surgeon: Algernon Huxley, MD;  Location: [place] [facility];  Service: Cardiovascular;  Laterality: N/A;  . PERIPHERAL VASCULAR CATHETERIZATION  07/06/2015   Procedure: Lower Extremity Intervention;  Surgeon: Algernon Huxley, MD;  Location: [place] [facility];  Service: Cardiovascular;;   Family History  Problem Relation Age of Onset  . Heart attack Father   . Heart disease Father   . Hypertension Father   . Hyperlipidemia Father   . Hypertension Mother   . Hyperlipidemia Mother    Social History   Tobacco Use  .  Smoking status: Current Every Day Smoker    Packs/day: 1.00    Years: 41.00    Pack years: 41.00    Types: Cigarettes  . Smokeless tobacco: Never Used  Substance Use Topics  . Alcohol use: No   Allergies  Allergen Reactions  . Dulaglutide Anaphylaxis, Diarrhea and Hives  . Other Itching    Skin itching associated with nitro patch  . Prednisone Rash   Prior to Admission medications   Medication Sig Start Date End Date Taking? Authorizing Provider  albuterol (VENTOLIN HFA) 108 (90 Base) MCG/ACT inhaler Inhale into the lungs every 6 (six) hours as needed for wheezing or shortness of breath.   Yes [provider]  aspirin EC 81 MG tablet Take 1 tablet (81 mg total) by mouth daily. 05/11/15  Yes Wellington Hampshire, MD  atorvastatin (LIPITOR) 80 MG tablet Take 80 mg by mouth daily.   Yes [provider]  calcium carbonate (TUMS - DOSED IN MG ELEMENTAL CALCIUM) 500 MG chewable tablet Chew 4 tablets by mouth  daily as needed for indigestion or heartburn.   Yes [provider]  clopidogrel (PLAVIX) 75 MG tablet Take 75 mg by mouth daily.  08/22/17  Yes [provider]  collagenase (SANTYL) ointment Apply 1 application topically daily. 08/11/19  Yes Kris Hartmann, NP  DULoxetine (CYMBALTA) 20 MG capsule Take 20 mg by mouth daily.   Yes [provider]  ezetimibe (ZETIA) 10 MG tablet TAKE 1 TABLET BY MOUTH ONCE DAILY 11/07/19  Yes Darylene Price A, FNP  Insulin Glargine (LANTUS SOLOSTAR) 100 UNIT/ML Solostar Pen Inject 30 Units into the skin at bedtime. Patient taking differently: Inject 20-40 Units into the skin 2 (two) times daily. 40 units in the morning and 20 units at night 04/07/18  Yes Mayo, Pete Pelt, MD  insulin lispro (HUMALOG) 100 UNIT/ML injection Sliding Scale Insulin before meals using Novolog If BS is 0 to 70 no insulin and eat/glucose tablets/glucose get If BS 71 to 150 great! If BS 151 to 200, 2 Units Novolog If BS 201 to 250, 4 Units Novolog If BS 251 to 300, 6 Units Novolog If BS 301 to 350, 8 Units Novolog If BS 351 to 400, 10 Units Novolog If BS greater than 400, take 10 Units and Contact our office during office hour 04/14/18  Yes [provider]  ipratropium-albuterol (DUONEB) 0.5-2.5 (3) MG/3ML SOLN Inhale 3 mLs into the lungs every 6 (six) hours as needed (wheezing/sob).    Yes [provider]  isosorbide mononitrate (IMDUR) 30 MG 24 hr tablet TAKE 1 TABLET BY MOUTH ONCE DAILY 11/07/19  Yes Darylene Price A, FNP  metFORMIN (GLUCOPHAGE-XR) 500 MG 24 hr tablet Take 1,000 mg by mouth 2 (two) times daily.    Yes [provider]  metoprolol succinate (TOPROL-XL) 25 MG 24 hr tablet Take 1 tablet (25 mg total) by mouth daily. 07/02/19 08/29/20 Yes Loel Dubonnet, NP  Multiple Vitamin (MULTI-VITAMINS) TABS Take 1 tablet by mouth daily.    Yes [provider]  nitroGLYCERIN (NITROSTAT) 0.4 MG SL tablet Place 1 tablet (0.4 mg total) under  the tongue every 5 (five) minutes as needed for chest pain. 05/19/19  Yes Malcom Selmer, Otila Kluver A, FNP  omeprazole (PRILOSEC) 40 MG capsule Take 40 mg by mouth daily.   Yes [provider]  Oxycodone HCl 10 MG TABS Take 10 mg by mouth every 6 (six) hours as needed (pain).  01/15/18  Yes [provider]  potassium chloride (KLOR-CON) 10 MEQ tablet Take 1 tablet (10 mEq total) by mouth as directed. Take 1 tablet twice a day on days with metolazone take 4 tablets (40 mEq) 10/04/19 01/02/20 Yes Gollan, Kathlene November, MD  sacubitril-valsartan (ENTRESTO) 24-26 MG Take 1 tablet by mouth 2 (two) times daily. 09/30/19  Yes Shahmehdi, Seyed A, MD  spironolactone (ALDACTONE) 25 MG tablet Take 1 tablet (25 mg total) by mouth daily. 10/01/19  Yes Shahmehdi, Seyed A, MD  torsemide (DEMADEX) 20 MG tablet Take 3 tablets (60 mg total) by mouth daily. 11/03/19  Yes Loel Dubonnet, NP  traZODone (DESYREL) 150 MG tablet Take 150 mg by mouth at bedtime. 07/12/19  Yes [provider]  gabapentin (NEURONTIN) 800 MG tablet Take 800 mg by mouth 4 (four) times daily.  08/14/17 11/03/19  [provider]  metolazone (ZAROXOLYN) 2.5 MG tablet Take 1 tablet (2.5 mg total) by mouth as needed (for weight gain of 3lb overnight or 5lb in 1 week take 1 tablet before morning Torsemide). Patient not taking: Reported on 11/15/2019 11/03/19 02/01/20  Loel Dubonnet, NP     Review of Systems  Constitutional: Negative for appetite change and fatigue.  HENT: Positive for rhinorrhea. Negative for congestion, postnasal drip and sore throat.   Eyes: Negative.   Respiratory: Positive for wheezing. Negative for cough, chest tightness and shortness of breath.   Cardiovascular: Negative for chest pain, palpitations and leg swelling.  Gastrointestinal: Negative for abdominal distention and abdominal pain.  Endocrine: Negative.   Genitourinary: Negative.   Musculoskeletal: Positive for back pain. Negative for neck pain.       Has  right lower leg prosthesis  Skin: Negative.   Allergic/Immunologic: Negative.   Neurological: Positive for headaches. Negative for dizziness and light-headedness.  Hematological: Negative for adenopathy. Bruises/bleeds easily.  Psychiatric/Behavioral: Negative for dysphoric mood and sleep disturbance (sleeping on 2 pillows). The patient is not nervous/anxious.    Vitals:   11/15/19 1239  BP: 109/75  Pulse: (!) 101  Resp: 20  SpO2: 100%  Weight: 199 lb 6 oz (90.4 kg)  Height: 5\' 9"  (1.753 m)   Wt Readings from Last 3 Encounters:  11/15/19 199 lb 6 oz (90.4 kg)  11/03/19 198 lb (89.8 kg)  10/04/19 210 lb 8 oz (95.5 kg)   Lab Results  Component Value Date   CREATININE 1.08 11/03/2019   CREATININE 1.05 09/29/2019   CREATININE 1.04 09/28/2019    Physical Exam  Constitutional: He is oriented to person, place, and time. He appears well-developed and well-nourished.  HENT:  Head: Normocephalic and atraumatic.  Neck: No JVD present.  Cardiovascular: Normal rate and regular rhythm.  Pulmonary/Chest: Effort normal. He has wheezes in the right lower field and the left lower field. He has no rhonchi. He has no rales.  Abdominal: Soft. He exhibits no distension. There is no abdominal tenderness.  Musculoskeletal:        General: No tenderness or edema.     Cervical back: Normal range of motion and neck supple.  Neurological: He is alert and oriented to person, place, and time.  Skin: Skin is warm and dry. No erythema.  Psychiatric: He has a normal mood and affect. His behavior is normal. Thought content normal.  Nursing note and vitals reviewed.   Assessment & Plan:  1: Chronic heart failure with reduced ejection fraction- - NYHA class I - evolemic today - weighing daily; reminded to call for an overnight weight gain of >2 pounds or  a weekly weight gain of >5 pounds - weight down 25 pounds from last visit here 2 months ago - not adding salt and has been using Mrs. Dash & trying  to read food labels.  - drinking ~ 64 ounces of fluid daily (Mtn Dew, pepsi, water) - BP unable to tolerate entresto titration - saw cardiology Gilford Rile) 11/03/19 - BNP 09/26/19 was 1342.0 - participating in paramedicine program  2: HTN- - BP looks good although on the low side today - saw PCP Dr. 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 Each medication was verbally reviewed with the patient and he was encouraged to bring the bottles to every visit to confirm accuracy of list.   Return in 2 months or sooner for any questions/problems before then.

## 2019-11-15 ENCOUNTER — Ambulatory Visit: Payer: Medicare Other | Attending: Family | Admitting: Family

## 2019-11-15 ENCOUNTER — Encounter: Payer: Self-pay | Admitting: Family

## 2019-11-15 ENCOUNTER — Other Ambulatory Visit: Payer: Self-pay

## 2019-11-15 VITALS — BP 109/75 | HR 101 | Resp 20 | Ht 69.0 in | Wt 199.4 lb

## 2019-11-15 DIAGNOSIS — Z7982 Long term (current) use of aspirin: Secondary | ICD-10-CM | POA: Diagnosis not present

## 2019-11-15 DIAGNOSIS — I2510 Atherosclerotic heart disease of native coronary artery without angina pectoris: Secondary | ICD-10-CM | POA: Insufficient documentation

## 2019-11-15 DIAGNOSIS — Z8631 Personal history of diabetic foot ulcer: Secondary | ICD-10-CM | POA: Diagnosis not present

## 2019-11-15 DIAGNOSIS — N400 Enlarged prostate without lower urinary tract symptoms: Secondary | ICD-10-CM | POA: Diagnosis not present

## 2019-11-15 DIAGNOSIS — E1151 Type 2 diabetes mellitus with diabetic peripheral angiopathy without gangrene: Secondary | ICD-10-CM | POA: Insufficient documentation

## 2019-11-15 DIAGNOSIS — Z955 Presence of coronary angioplasty implant and graft: Secondary | ICD-10-CM | POA: Insufficient documentation

## 2019-11-15 DIAGNOSIS — I10 Essential (primary) hypertension: Secondary | ICD-10-CM

## 2019-11-15 DIAGNOSIS — E1159 Type 2 diabetes mellitus with other circulatory complications: Secondary | ICD-10-CM

## 2019-11-15 DIAGNOSIS — G2581 Restless legs syndrome: Secondary | ICD-10-CM | POA: Insufficient documentation

## 2019-11-15 DIAGNOSIS — J449 Chronic obstructive pulmonary disease, unspecified: Secondary | ICD-10-CM | POA: Insufficient documentation

## 2019-11-15 DIAGNOSIS — E1140 Type 2 diabetes mellitus with diabetic neuropathy, unspecified: Secondary | ICD-10-CM | POA: Insufficient documentation

## 2019-11-15 DIAGNOSIS — Z89511 Acquired absence of right leg below knee: Secondary | ICD-10-CM | POA: Insufficient documentation

## 2019-11-15 DIAGNOSIS — I5022 Chronic systolic (congestive) heart failure: Secondary | ICD-10-CM

## 2019-11-15 DIAGNOSIS — I70219 Atherosclerosis of native arteries of extremities with intermittent claudication, unspecified extremity: Secondary | ICD-10-CM

## 2019-11-15 DIAGNOSIS — Z79899 Other long term (current) drug therapy: Secondary | ICD-10-CM | POA: Diagnosis not present

## 2019-11-15 DIAGNOSIS — I509 Heart failure, unspecified: Secondary | ICD-10-CM | POA: Diagnosis present

## 2019-11-15 DIAGNOSIS — Z8349 Family history of other endocrine, nutritional and metabolic diseases: Secondary | ICD-10-CM | POA: Insufficient documentation

## 2019-11-15 DIAGNOSIS — E785 Hyperlipidemia, unspecified: Secondary | ICD-10-CM | POA: Insufficient documentation

## 2019-11-15 DIAGNOSIS — F17210 Nicotine dependence, cigarettes, uncomplicated: Secondary | ICD-10-CM | POA: Diagnosis not present

## 2019-11-15 DIAGNOSIS — M1990 Unspecified osteoarthritis, unspecified site: Secondary | ICD-10-CM | POA: Insufficient documentation

## 2019-11-15 DIAGNOSIS — I110 Hypertensive heart disease with heart failure: Secondary | ICD-10-CM | POA: Insufficient documentation

## 2019-11-15 DIAGNOSIS — I739 Peripheral vascular disease, unspecified: Secondary | ICD-10-CM

## 2019-11-15 DIAGNOSIS — Z794 Long term (current) use of insulin: Secondary | ICD-10-CM | POA: Insufficient documentation

## 2019-11-15 DIAGNOSIS — I255 Ischemic cardiomyopathy: Secondary | ICD-10-CM | POA: Diagnosis not present

## 2019-11-15 DIAGNOSIS — G4733 Obstructive sleep apnea (adult) (pediatric): Secondary | ICD-10-CM | POA: Insufficient documentation

## 2019-11-15 DIAGNOSIS — Z888 Allergy status to other drugs, medicaments and biological substances status: Secondary | ICD-10-CM | POA: Diagnosis not present

## 2019-11-15 DIAGNOSIS — Z8249 Family history of ischemic heart disease and other diseases of the circulatory system: Secondary | ICD-10-CM | POA: Insufficient documentation

## 2019-11-15 LAB — GLUCOSE, CAPILLARY: Glucose-Capillary: 126 mg/dL — ABNORMAL HIGH (ref 70–99)

## 2019-11-15 NOTE — Patient Instructions (Signed)
Continue weighing daily and call for an overnight weight gain of > 2 pounds or a weekly weight gain of >5 pounds. 

## 2019-11-18 ENCOUNTER — Ambulatory Visit (INDEPENDENT_AMBULATORY_CARE_PROVIDER_SITE_OTHER): Payer: Medicare Other | Admitting: Nurse Practitioner

## 2019-12-06 ENCOUNTER — Telehealth (HOSPITAL_COMMUNITY): Payer: Self-pay

## 2019-12-06 NOTE — Telephone Encounter (Signed)
Today had a phone appt with Joshu.  He states been doing ok until today, he went to get off the lawn mower and twisted his leg.  He states hurting bad.  He is refusing to go to ED, advised him he could go to [facility] urgent care and he said he did not know that it was there.  Advised him they can check him out and do xrays.  He is concerned because it is the leg that has the amputation.  He says his swelling has been good.  Denies shortness of breath, chest pain, dizziness or headaches.  He states been doing good.  He has all his medications.  He does not weight due to can not stand.  He has everything for daily living.  Aware of up coming appts.  Watches high sodium foods.  Will continue to visit for heart failure.  Will follow up to see how went with [facility] today.  He states he is going there.   [place] [phone]

## 2020-01-04 ENCOUNTER — Telehealth (HOSPITAL_COMMUNITY): Payer: Self-pay

## 2020-01-04 NOTE — Telephone Encounter (Signed)
Today had a telephone visit with [name] [name].  He states been doing well.  His weight is 189 lbs and has been staying around that.  He denies build up of fluid.  He has all his medications and verified them.  He states omeprazole has helped with his heart burn, he has none now.  He takes his gabapentin as needed to help with neuropathy pain.  He tries to stay as active as he can.  They are wanting to cut away calluses on his other foot and he is worried about letting them and getting infection, he does not want to lose another leg. He denies any chest pain, headaches, shortness of breath or dizziness.  He lives with his girlfriend and she helps him to do things.  He is weighing now.  He is aware of up coming appts.  He has everything for daily living.  Will continue to visit for heart failure.   [place] [phone]

## 2020-01-12 ENCOUNTER — Telehealth (INDEPENDENT_AMBULATORY_CARE_PROVIDER_SITE_OTHER): Payer: Self-pay

## 2020-01-17 ENCOUNTER — Encounter (INDEPENDENT_AMBULATORY_CARE_PROVIDER_SITE_OTHER): Payer: Self-pay | Admitting: Vascular Surgery

## 2020-01-17 ENCOUNTER — Other Ambulatory Visit: Payer: Self-pay

## 2020-01-17 ENCOUNTER — Ambulatory Visit (INDEPENDENT_AMBULATORY_CARE_PROVIDER_SITE_OTHER): Payer: Medicare Other | Admitting: Vascular Surgery

## 2020-01-17 VITALS — BP 124/87 | HR 124 | Resp 16

## 2020-01-17 DIAGNOSIS — I10 Essential (primary) hypertension: Secondary | ICD-10-CM | POA: Diagnosis not present

## 2020-01-17 DIAGNOSIS — I890 Lymphedema, not elsewhere classified: Secondary | ICD-10-CM

## 2020-01-17 DIAGNOSIS — I25118 Atherosclerotic heart disease of native coronary artery with other forms of angina pectoris: Secondary | ICD-10-CM | POA: Diagnosis not present

## 2020-01-17 DIAGNOSIS — I739 Peripheral vascular disease, unspecified: Secondary | ICD-10-CM

## 2020-01-17 DIAGNOSIS — I4819 Other persistent atrial fibrillation: Secondary | ICD-10-CM

## 2020-01-18 ENCOUNTER — Ambulatory Visit: Payer: Medicare Other | Admitting: Family

## 2020-01-18 ENCOUNTER — Encounter: Payer: Self-pay | Admitting: Family

## 2020-01-18 VITALS — BP 97/77 | HR 50 | Resp 18 | Ht 67.0 in | Wt 197.4 lb

## 2020-01-18 DIAGNOSIS — M1990 Unspecified osteoarthritis, unspecified site: Secondary | ICD-10-CM | POA: Insufficient documentation

## 2020-01-18 DIAGNOSIS — E1151 Type 2 diabetes mellitus with diabetic peripheral angiopathy without gangrene: Secondary | ICD-10-CM | POA: Insufficient documentation

## 2020-01-18 DIAGNOSIS — I5022 Chronic systolic (congestive) heart failure: Secondary | ICD-10-CM

## 2020-01-18 DIAGNOSIS — Z8349 Family history of other endocrine, nutritional and metabolic diseases: Secondary | ICD-10-CM | POA: Insufficient documentation

## 2020-01-18 DIAGNOSIS — G8929 Other chronic pain: Secondary | ICD-10-CM | POA: Insufficient documentation

## 2020-01-18 DIAGNOSIS — I10 Essential (primary) hypertension: Secondary | ICD-10-CM

## 2020-01-18 DIAGNOSIS — G2581 Restless legs syndrome: Secondary | ICD-10-CM | POA: Insufficient documentation

## 2020-01-18 DIAGNOSIS — G4733 Obstructive sleep apnea (adult) (pediatric): Secondary | ICD-10-CM | POA: Insufficient documentation

## 2020-01-18 DIAGNOSIS — R7989 Other specified abnormal findings of blood chemistry: Secondary | ICD-10-CM | POA: Insufficient documentation

## 2020-01-18 DIAGNOSIS — I214 Non-ST elevation (NSTEMI) myocardial infarction: Secondary | ICD-10-CM | POA: Diagnosis not present

## 2020-01-18 DIAGNOSIS — I5042 Chronic combined systolic (congestive) and diastolic (congestive) heart failure: Secondary | ICD-10-CM | POA: Insufficient documentation

## 2020-01-18 DIAGNOSIS — Z7951 Long term (current) use of inhaled steroids: Secondary | ICD-10-CM | POA: Insufficient documentation

## 2020-01-18 DIAGNOSIS — E1140 Type 2 diabetes mellitus with diabetic neuropathy, unspecified: Secondary | ICD-10-CM | POA: Insufficient documentation

## 2020-01-18 DIAGNOSIS — I739 Peripheral vascular disease, unspecified: Secondary | ICD-10-CM

## 2020-01-18 DIAGNOSIS — Z89511 Acquired absence of right leg below knee: Secondary | ICD-10-CM | POA: Insufficient documentation

## 2020-01-18 DIAGNOSIS — F17210 Nicotine dependence, cigarettes, uncomplicated: Secondary | ICD-10-CM | POA: Insufficient documentation

## 2020-01-18 DIAGNOSIS — I255 Ischemic cardiomyopathy: Secondary | ICD-10-CM | POA: Insufficient documentation

## 2020-01-18 DIAGNOSIS — Z8249 Family history of ischemic heart disease and other diseases of the circulatory system: Secondary | ICD-10-CM | POA: Insufficient documentation

## 2020-01-18 DIAGNOSIS — N400 Enlarged prostate without lower urinary tract symptoms: Secondary | ICD-10-CM | POA: Insufficient documentation

## 2020-01-18 DIAGNOSIS — Z7982 Long term (current) use of aspirin: Secondary | ICD-10-CM | POA: Insufficient documentation

## 2020-01-18 DIAGNOSIS — J449 Chronic obstructive pulmonary disease, unspecified: Secondary | ICD-10-CM | POA: Insufficient documentation

## 2020-01-18 DIAGNOSIS — Z955 Presence of coronary angioplasty implant and graft: Secondary | ICD-10-CM | POA: Insufficient documentation

## 2020-01-18 DIAGNOSIS — E1159 Type 2 diabetes mellitus with other circulatory complications: Secondary | ICD-10-CM

## 2020-01-18 DIAGNOSIS — I110 Hypertensive heart disease with heart failure: Secondary | ICD-10-CM | POA: Insufficient documentation

## 2020-01-18 DIAGNOSIS — Z7984 Long term (current) use of oral hypoglycemic drugs: Secondary | ICD-10-CM | POA: Insufficient documentation

## 2020-01-18 DIAGNOSIS — I071 Rheumatic tricuspid insufficiency: Secondary | ICD-10-CM | POA: Insufficient documentation

## 2020-01-18 DIAGNOSIS — Z720 Tobacco use: Secondary | ICD-10-CM

## 2020-01-18 DIAGNOSIS — I2510 Atherosclerotic heart disease of native coronary artery without angina pectoris: Secondary | ICD-10-CM | POA: Insufficient documentation

## 2020-01-18 DIAGNOSIS — Z9049 Acquired absence of other specified parts of digestive tract: Secondary | ICD-10-CM | POA: Insufficient documentation

## 2020-01-18 DIAGNOSIS — E785 Hyperlipidemia, unspecified: Secondary | ICD-10-CM | POA: Insufficient documentation

## 2020-01-18 DIAGNOSIS — I252 Old myocardial infarction: Secondary | ICD-10-CM | POA: Insufficient documentation

## 2020-01-18 DIAGNOSIS — Z79899 Other long term (current) drug therapy: Secondary | ICD-10-CM | POA: Insufficient documentation

## 2020-01-18 LAB — GLUCOSE, CAPILLARY: Glucose-Capillary: 170 mg/dL — ABNORMAL HIGH (ref 70–99)

## 2020-01-18 NOTE — Progress Notes (Signed)
Patient ID: [name] [name], male    DOB: 1961/04/25, 59 y.o.   MRN: [id number]  HPI  Mr Sawaya is a 59 y/o male with a history of obstructive sleep apnea, PVD, PAD, HTN, hyperlipidemia, DM, CAD, COPD, BPH, asthma, current tobacco use and chronic heart failure.   Echo report from 09/29/19 reviewed and showed an EF of 20-25% along with moderately elevated PA pressure, mild MR and mild/moderate TR. Echo report from 11/06/2018 reviewed and showed an EF of 20-25% along with moderate TR and a PA pressure of 59.6 mmHg. Echo report from 04/25/17 reviewed and shows an EF of 25-30% along with a PA pressure of 34 mm Hg. EF has declined from 55-60% back in 2016.   Cardiac catheterization done 04/28/17 showed significant three-vessel disease with a patient stent in the RCA and LAD. Significant proximal RCS disease and the stent as well as diffuse mid and distal disease. LAD has moderate disease. Severely elevated left ventricular end-diastolic pressure at 34 mmHg. Possible CABG in the future with optimizing medical management. Stress done in 2015.  Admitted 09/27/19 due to left lower leg cellulitis and acute on chronic HF. Cardiology and wound consult obtained. Given IV lasix and antibiotics with transition to oral medications. Venous doppler negative for DVT. Discharged after 3 days. Was in the ED 09/22/19 but left without being seen. Was in the ED 09/17/19 but left without being seen. Was in the ED 09/10/19 due to pedal edema. Admits to not taking his medications. Given IV lasix with improvement in symptoms and he was released. Was in the ED 08/30/19 due to HF exacerbation. Nebulizer given for wheezing. CT and [place] were negative for clots. Mildly elevated troponin thought to be due to demand ischemia. IV antibiotics given for LLL cellulitis. Patient left AMA  He presents today for a follow-up visit with a chief complaint of moderate shortness of breath with little exertion. He describes this as chronic in nature having been  present for several years although feels like it has worsened with the heat/ humidity along with wearing a mask. He has associated cough, headache, wheezing, intermittent chest pain, chronic back pain and left lower leg wound. He denies any abdominal distention, palpitations, left leg edema, dizziness, fatigue or weight gain.   Used his nebulizer prior to coming to the office today and hasn't had anything yet to eat so admits that he feels quite jittery and shaky.   Past Medical History:  Diagnosis Date  . Arthritis   . Asthma   . Atherosclerosis   . BPH (benign prostatic hyperplasia)   . Carotid arterial disease ([place])   . Charcot's joint of foot, right   . Chronic combined systolic (congestive) and diastolic (congestive) heart failure ([place])    a. 02/2013 EF 50% by LV gram; b. 04/2017 Echo: EF 25-30%. diff HK. Gr2 DD. Mod dil LA/RV. PASP 67mmHg.  [name] COPD (chronic obstructive pulmonary disease) ([place])   . Coronary artery disease    a. 2013 S/P PCI of LAD [facility]);  b. 03/2013 PCI: RCA 90p ( 2.5 x 23 mm DES); c. 02/2014 Cath: patent RCA stent->Med Rx; d. 04/2017 Cath: LM nl, LAd 30ost/p, 20m/d, D1 80ost, LCX 90p/m, OM1 85, RCA 70/20p, 60m, 70d->Referred for CT Surg-felt to be poor candidate.  . Diabetic neuropathy ([place])   . Diabetic ulcer of right foot ([place])   . Hernia   . Hyperlipidemia   . Hypertension   . Ischemic cardiomyopathy    a. 04/2017 Echo: EF  25-30%; b. 08/2017 Cardiac MRI ([facility]): EF 19%, sev glob HK. RVEF 20%, mod BAE, triv MR, mild-mod TR. Basal lateral subendocardial infarct (viable), inf/infsept, dist septal ischemia, basal to mid lat peri-infarct ischemia.  . Morbid obesity ([place])   . Neuropathy   . PAD (peripheral artery disease) ([place])    a. Followed by Dr. Lucky Cowboy; b. 08/2010 Periph Angio: RSFA 70-80p (6X60 self-expanding stent); c. 09/2010 Periph Angio: L SFA 100p (7x unknown length self-expanding stent); d. 06/2015 Periph Angio: R SFA short segment occlusion (6x12 self-expanding stent).   . Restless leg syndrome   . Sleep apnea   . Subclavian artery stenosis, right ([place])   . Syncope and collapse   . Tobacco use    a. 75+ yr hx - still smoking 1ppd, down from 2 ppd.  . Type II diabetes mellitus ([place])   . Varicose vein    Past Surgical History:  Procedure Laterality Date  . ABDOMINAL AORTAGRAM N/A 06/23/2013   Procedure: ABDOMINAL Maxcine Ham;  Surgeon: Wellington Hampshire, MD;  Location: [place] CATH LAB;  Service: Cardiovascular;  Laterality: N/A;  . AMPUTATION Right 04/01/2018   Procedure: AMPUTATION BELOW KNEE;  Surgeon: Algernon Huxley, MD;  Location: [facility] ORS;  Service: Vascular;  Laterality: Right;  . APPENDECTOMY    . CARDIAC CATHETERIZATION  10/14   [facility]; X1 STENT PROXIMAL RCA  . CARDIAC CATHETERIZATION  02/2010   [facility]  . CARDIAC CATHETERIZATION  02/21/2014   [facility]  . CLAVICLE SURGERY    . HERNIA REPAIR    . IRRIGATION AND DEBRIDEMENT FOOT Right 01/04/2018   Procedure: IRRIGATION AND DEBRIDEMENT FOOT;  Surgeon: Samara Deist, DPM;  Location: [facility] ORS;  Service: Podiatry;  Laterality: Right;  . LEFT HEART CATH AND CORS/GRAFTS ANGIOGRAPHY N/A 04/28/2017   Procedure: LEFT HEART CATH AND CORONARY ANGIOGRAPHY;  Surgeon: Wellington Hampshire, MD;  Location: [place] CV LAB;  Service: Cardiovascular;  Laterality: N/A;  . LOWER EXTREMITY ANGIOGRAPHY Right 03/03/2018   Procedure: LOWER EXTREMITY ANGIOGRAPHY;  Surgeon: Katha Cabal, MD;  Location: [place] CV LAB;  Service: Cardiovascular;  Laterality: Right;  . LOWER EXTREMITY ANGIOGRAPHY Left 08/24/2018   Procedure: LOWER EXTREMITY ANGIOGRAPHY;  Surgeon: Algernon Huxley, MD;  Location: [place] CV LAB;  Service: Cardiovascular;  Laterality: Left;  . LOWER EXTREMITY ANGIOGRAPHY Left 06/14/2019   Procedure: LOWER EXTREMITY ANGIOGRAPHY;  Surgeon: Algernon Huxley, MD;  Location: [place] CV LAB;  Service: Cardiovascular;  Laterality: Left;  . LOWER EXTREMITY ANGIOGRAPHY Left 08/02/2019   Procedure: LOWER EXTREMITY  ANGIOGRAPHY;  Surgeon: Algernon Huxley, MD;  Location: [place] CV LAB;  Service: Cardiovascular;  Laterality: Left;  . PERIPHERAL ARTERIAL STENT GRAFT     x2 left/right  . PERIPHERAL VASCULAR BALLOON ANGIOPLASTY Right 01/06/2018   Procedure: PERIPHERAL VASCULAR BALLOON ANGIOPLASTY;  Surgeon: Katha Cabal, MD;  Location: [place] CV LAB;  Service: Cardiovascular;  Laterality: Right;  . PERIPHERAL VASCULAR CATHETERIZATION N/A 07/06/2015   Procedure: Abdominal Aortogram w/Lower Extremity;  Surgeon: Algernon Huxley, MD;  Location: [place] CV LAB;  Service: Cardiovascular;  Laterality: N/A;  . PERIPHERAL VASCULAR CATHETERIZATION  07/06/2015   Procedure: Lower Extremity Intervention;  Surgeon: Algernon Huxley, MD;  Location: [place] CV LAB;  Service: Cardiovascular;;   Family History  Problem Relation Age of Onset  . Heart attack Father   . Heart disease Father   . Hypertension Father   . Hyperlipidemia Father   . Hypertension Mother   . Hyperlipidemia Mother  Social History   Tobacco Use  . Smoking status: Current Every Day Smoker    Packs/day: 1.00    Years: 41.00    Pack years: 41.00    Types: Cigarettes  . Smokeless tobacco: Never Used  Substance Use Topics  . Alcohol use: No   Allergies  Allergen Reactions  . Dulaglutide Anaphylaxis, Diarrhea and Hives  . Other Itching    Skin itching associated with nitro patch  . Prednisone Rash   Prior to Admission medications   Medication Sig Start Date End Date Taking? Authorizing Provider  albuterol (VENTOLIN HFA) 108 (90 Base) MCG/ACT inhaler Inhale into the lungs every 6 (six) hours as needed for wheezing or shortness of breath.   Yes [provider]  aspirin EC 81 MG tablet Take 1 tablet (81 mg total) by mouth daily. 05/11/15  Yes Wellington Hampshire, MD  atorvastatin (LIPITOR) 80 MG tablet Take 80 mg by mouth daily.   Yes [provider]  clopidogrel (PLAVIX) 75 MG tablet Take 75 mg by mouth daily.   08/22/17  Yes [provider]  collagenase (SANTYL) ointment Apply 1 application topically daily. 08/11/19  Yes Kris Hartmann, NP  DULoxetine (CYMBALTA) 20 MG capsule Take 20 mg by mouth daily.   Yes [provider]  ezetimibe (ZETIA) 10 MG tablet TAKE 1 TABLET BY MOUTH ONCE DAILY 11/07/19  Yes Darylene Price A, FNP  gabapentin (NEURONTIN) 800 MG tablet Take 800 mg by mouth 4 (four) times daily.  08/14/17 01/18/20 Yes [provider]  ipratropium-albuterol (DUONEB) 0.5-2.5 (3) MG/3ML SOLN Inhale 3 mLs into the lungs every 6 (six) hours as needed (wheezing/sob).    Yes [provider]  isosorbide mononitrate (IMDUR) 30 MG 24 hr tablet TAKE 1 TABLET BY MOUTH ONCE DAILY 11/07/19  Yes Darylene Price A, FNP  metFORMIN (GLUCOPHAGE-XR) 500 MG 24 hr tablet Take 1,000 mg by mouth 2 (two) times daily.    Yes [provider]  metoprolol succinate (TOPROL-XL) 25 MG 24 hr tablet Take 1 tablet (25 mg total) by mouth daily. 07/02/19 08/29/20 Yes Loel Dubonnet, NP  Multiple Vitamin (MULTI-VITAMINS) TABS Take 1 tablet by mouth daily.    Yes [provider]  nitroGLYCERIN (NITROSTAT) 0.4 MG SL tablet Place 1 tablet (0.4 mg total) under the tongue every 5 (five) minutes as needed for chest pain. 05/19/19  Yes Najia Hurlbutt, Otila Kluver A, FNP  omeprazole (PRILOSEC) 40 MG capsule Take 40 mg by mouth daily.   Yes [provider]  Oxycodone HCl 10 MG TABS Take 10 mg by mouth every 6 (six) hours as needed (pain).  01/15/18  Yes [provider]  potassium chloride (KLOR-CON) 10 MEQ tablet Take 1 tablet (10 mEq total) by mouth as directed. Take 1 tablet twice a day on days with metolazone take 4 tablets (40 mEq) 10/04/19 01/18/20 Yes Gollan, Kathlene November, MD  sacubitril-valsartan (ENTRESTO) 24-26 MG Take 1 tablet by mouth 2 (two) times daily. 09/30/19  Yes Shahmehdi, Seyed A, MD  spironolactone (ALDACTONE) 25 MG tablet Take 1 tablet (25 mg total) by mouth daily. 10/01/19  Yes  Shahmehdi, Valeria Batman, MD  SYMBICORT 80-4.5 MCG/ACT inhaler Inhale into the lungs. 01/01/20  Yes [provider]  torsemide (DEMADEX) 20 MG tablet Take 3 tablets (60 mg total) by mouth daily. 11/03/19  Yes Loel Dubonnet, NP  traZODone (DESYREL) 150 MG tablet Take 150 mg by mouth at bedtime. 07/12/19  Yes [provider]   Review of Systems  Constitutional: Negative for appetite change and fatigue.  HENT: Positive for rhinorrhea. Negative for congestion, postnasal drip and sore throat.   Eyes: Negative.   Respiratory: Positive for cough, shortness of breath (worse w/heat and humidity) and wheezing. Negative for chest tightness.   Cardiovascular: Positive for chest pain (yesterday). Negative for palpitations and leg swelling.  Gastrointestinal: Negative for abdominal distention and abdominal pain.  Endocrine: Negative.   Genitourinary: Negative.   Musculoskeletal: Positive for back pain. Negative for neck pain.       Has right lower leg prosthesis  Skin: Positive for wound (left shin).  Allergic/Immunologic: Negative.   Neurological: Positive for headaches. Negative for dizziness and light-headedness.  Hematological: Negative for adenopathy. Bruises/bleeds easily.  Psychiatric/Behavioral: Negative for dysphoric mood and sleep disturbance (sleeping on 2 pillows). The patient is not nervous/anxious.    Vitals:   01/18/20 1339  BP: 97/77  Pulse: (!) 50  Resp: 18  SpO2: 95%  Weight: 197 lb 6 oz (89.5 kg)  Height: 5\' 7"  (1.702 m)   Wt Readings from Last 3 Encounters:  01/18/20 197 lb 6 oz (89.5 kg)  11/15/19 199 lb 6 oz (90.4 kg)  11/03/19 198 lb (89.8 kg)   Lab Results  Component Value Date   CREATININE 1.08 11/03/2019   CREATININE 1.05 09/29/2019   CREATININE 1.04 09/28/2019    Physical Exam Vitals and nursing note reviewed.  Constitutional:      Appearance: He is well-developed.  HENT:     Head: Normocephalic and atraumatic.  Neck:     Vascular: No JVD.   Cardiovascular:     Rate and Rhythm: Regular rhythm. Bradycardia present.  Pulmonary:     Effort: Pulmonary effort is normal.     Breath sounds: Examination of the left-upper field reveals wheezing. Examination of the right-lower field reveals rhonchi. Examination of the left-lower field reveals rhonchi. Wheezing and rhonchi present. No rales.  Abdominal:     General: There is no distension.     Palpations: Abdomen is soft.     Tenderness: There is no abdominal tenderness.  Musculoskeletal:        General: No tenderness.     Cervical back: Normal range of motion and neck supple.     Left lower leg: No tenderness. No edema.  Skin:    General: Skin is warm and dry.     Findings: No erythema.  Neurological:     Mental Status: He is alert and oriented to person, place, and time.  Psychiatric:        Behavior: Behavior normal.        Thought Content: Thought content normal.     Assessment & Plan:  1: Chronic heart failure with reduced ejection fraction- - NYHA class III - evolemic today - weighing daily; reminded to call for an overnight weight gain of >2 pounds or a weekly weight gain of >5 pounds - weight down 2 pounds from last visit here 2 months ago - not adding salt and has been using Mrs. Dash & trying to read food labels.  - drinking ~ 64 ounces of fluid daily (Mtn Dew, pepsi, water) - BP unable to tolerate entresto titration - saw cardiology Gilford Rile) 11/03/19 - BNP 09/26/19 was 1342.0 - participating in paramedicine program - not interested in COVID vaccine at this time  2: HTN- - BP on the low side today - saw PCP Dr. 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 Each medication was verbally reviewed with the patient and he was encouraged to bring the bottles to every visit to confirm accuracy of list.   Return in 6 months or sooner for any questions/problems before then.

## 2020-01-18 NOTE — Patient Instructions (Signed)
Continue weighing daily and call for an overnight weight gain of > 2 pounds or a weekly weight gain of >5 pounds. 

## 2020-01-19 ENCOUNTER — Encounter: Payer: Self-pay | Admitting: Emergency Medicine

## 2020-01-19 ENCOUNTER — Emergency Department: Payer: Medicare Other

## 2020-01-19 ENCOUNTER — Other Ambulatory Visit: Payer: Self-pay

## 2020-01-19 ENCOUNTER — Inpatient Hospital Stay
Admission: EM | Admit: 2020-01-19 | Discharge: 2020-01-20 | DRG: 281 | Discharge status: Against Medical Advice | Payer: Medicare Other | Attending: Internal Medicine | Admitting: Internal Medicine

## 2020-01-19 DIAGNOSIS — Z720 Tobacco use: Secondary | ICD-10-CM | POA: Diagnosis not present

## 2020-01-19 DIAGNOSIS — I5022 Chronic systolic (congestive) heart failure: Secondary | ICD-10-CM | POA: Diagnosis not present

## 2020-01-19 DIAGNOSIS — F17210 Nicotine dependence, cigarettes, uncomplicated: Secondary | ICD-10-CM | POA: Diagnosis present

## 2020-01-19 DIAGNOSIS — I214 Non-ST elevation (NSTEMI) myocardial infarction: Principal | ICD-10-CM | POA: Diagnosis present

## 2020-01-19 DIAGNOSIS — D631 Anemia in chronic kidney disease: Secondary | ICD-10-CM | POA: Diagnosis present

## 2020-01-19 DIAGNOSIS — I10 Essential (primary) hypertension: Secondary | ICD-10-CM | POA: Diagnosis present

## 2020-01-19 DIAGNOSIS — I5042 Chronic combined systolic (congestive) and diastolic (congestive) heart failure: Secondary | ICD-10-CM | POA: Diagnosis present

## 2020-01-19 DIAGNOSIS — I255 Ischemic cardiomyopathy: Secondary | ICD-10-CM | POA: Diagnosis present

## 2020-01-19 DIAGNOSIS — R079 Chest pain, unspecified: Secondary | ICD-10-CM | POA: Diagnosis present

## 2020-01-19 DIAGNOSIS — I2510 Atherosclerotic heart disease of native coronary artery without angina pectoris: Secondary | ICD-10-CM | POA: Diagnosis not present

## 2020-01-19 DIAGNOSIS — Z79899 Other long term (current) drug therapy: Secondary | ICD-10-CM

## 2020-01-19 DIAGNOSIS — G8929 Other chronic pain: Secondary | ICD-10-CM | POA: Diagnosis present

## 2020-01-19 DIAGNOSIS — Z7982 Long term (current) use of aspirin: Secondary | ICD-10-CM

## 2020-01-19 DIAGNOSIS — I110 Hypertensive heart disease with heart failure: Secondary | ICD-10-CM | POA: Diagnosis present

## 2020-01-19 DIAGNOSIS — N400 Enlarged prostate without lower urinary tract symptoms: Secondary | ICD-10-CM | POA: Diagnosis present

## 2020-01-19 DIAGNOSIS — I259 Chronic ischemic heart disease, unspecified: Secondary | ICD-10-CM

## 2020-01-19 DIAGNOSIS — Z8249 Family history of ischemic heart disease and other diseases of the circulatory system: Secondary | ICD-10-CM

## 2020-01-19 DIAGNOSIS — I4891 Unspecified atrial fibrillation: Secondary | ICD-10-CM | POA: Diagnosis present

## 2020-01-19 DIAGNOSIS — E785 Hyperlipidemia, unspecified: Secondary | ICD-10-CM | POA: Diagnosis present

## 2020-01-19 DIAGNOSIS — G2581 Restless legs syndrome: Secondary | ICD-10-CM | POA: Diagnosis present

## 2020-01-19 DIAGNOSIS — G4733 Obstructive sleep apnea (adult) (pediatric): Secondary | ICD-10-CM | POA: Diagnosis present

## 2020-01-19 DIAGNOSIS — I959 Hypotension, unspecified: Secondary | ICD-10-CM | POA: Diagnosis present

## 2020-01-19 DIAGNOSIS — L03116 Cellulitis of left lower limb: Secondary | ICD-10-CM | POA: Diagnosis present

## 2020-01-19 DIAGNOSIS — Z5329 Procedure and treatment not carried out because of patient's decision for other reasons: Secondary | ICD-10-CM | POA: Diagnosis not present

## 2020-01-19 DIAGNOSIS — E1151 Type 2 diabetes mellitus with diabetic peripheral angiopathy without gangrene: Secondary | ICD-10-CM | POA: Diagnosis present

## 2020-01-19 DIAGNOSIS — J449 Chronic obstructive pulmonary disease, unspecified: Secondary | ICD-10-CM | POA: Diagnosis present

## 2020-01-19 DIAGNOSIS — Z20822 Contact with and (suspected) exposure to covid-19: Secondary | ICD-10-CM | POA: Diagnosis present

## 2020-01-19 DIAGNOSIS — Z89511 Acquired absence of right leg below knee: Secondary | ICD-10-CM

## 2020-01-19 DIAGNOSIS — E1169 Type 2 diabetes mellitus with other specified complication: Secondary | ICD-10-CM | POA: Diagnosis present

## 2020-01-19 DIAGNOSIS — Z7902 Long term (current) use of antithrombotics/antiplatelets: Secondary | ICD-10-CM

## 2020-01-19 DIAGNOSIS — E11610 Type 2 diabetes mellitus with diabetic neuropathic arthropathy: Secondary | ICD-10-CM | POA: Diagnosis present

## 2020-01-19 DIAGNOSIS — I739 Peripheral vascular disease, unspecified: Secondary | ICD-10-CM

## 2020-01-19 DIAGNOSIS — E1140 Type 2 diabetes mellitus with diabetic neuropathy, unspecified: Secondary | ICD-10-CM | POA: Diagnosis present

## 2020-01-19 DIAGNOSIS — Z83438 Family history of other disorder of lipoprotein metabolism and other lipidemia: Secondary | ICD-10-CM

## 2020-01-19 DIAGNOSIS — I4819 Other persistent atrial fibrillation: Secondary | ICD-10-CM | POA: Diagnosis present

## 2020-01-19 DIAGNOSIS — Z955 Presence of coronary angioplasty implant and graft: Secondary | ICD-10-CM

## 2020-01-19 LAB — PROTIME-INR
INR: 1.2 (ref 0.8–1.2)
Prothrombin Time: 14.5 seconds (ref 11.4–15.2)

## 2020-01-19 LAB — BASIC METABOLIC PANEL
Anion gap: 11 (ref 5–15)
BUN: 17 mg/dL (ref 6–20)
CO2: 32 mmol/L (ref 22–32)
Calcium: 8.6 mg/dL — ABNORMAL LOW (ref 8.9–10.3)
Chloride: 89 mmol/L — ABNORMAL LOW (ref 98–111)
Creatinine, Ser: 1.25 mg/dL — ABNORMAL HIGH (ref 0.61–1.24)
GFR calc Af Amer: >60 mL/min (ref >60)
GFR calc non Af Amer: >60 mL/min (ref >60)
Glucose, Bld: 147 mg/dL — ABNORMAL HIGH (ref 70–99)
Potassium: 3.5 mmol/L (ref 3.5–5.1)
Sodium: 132 mmol/L — ABNORMAL LOW (ref 135–145)

## 2020-01-19 LAB — SARS CORONAVIRUS 2 BY RT PCR (HOSPITAL ORDER, PERFORMED IN ~~LOC~~ HOSPITAL LAB): SARS Coronavirus 2: NEGATIVE

## 2020-01-19 LAB — GLUCOSE, CAPILLARY
Glucose-Capillary: 106 mg/dL — ABNORMAL HIGH (ref 70–99)
Glucose-Capillary: 97 mg/dL (ref 70–99)
Glucose-Capillary: 184 mg/dL — ABNORMAL HIGH (ref 70–99)
Glucose-Capillary: 201 mg/dL — ABNORMAL HIGH (ref 70–99)

## 2020-01-19 LAB — CBC
HCT: 36.5 % — ABNORMAL LOW (ref 39.0–52.0)
Hemoglobin: 12.1 g/dL — ABNORMAL LOW (ref 13.0–17.0)
MCH: 28.1 pg (ref 26.0–34.0)
MCHC: 33.2 g/dL (ref 30.0–36.0)
MCV: 84.9 fL (ref 80.0–100.0)
Platelets: 177 10*3/uL (ref 150–400)
RBC: 4.3 MIL/uL (ref 4.22–5.81)
RDW: 18.6 % — ABNORMAL HIGH (ref 11.5–15.5)
WBC: 6.3 10*3/uL (ref 4.0–10.5)
nRBC: 0 % (ref 0.0–0.2)

## 2020-01-19 LAB — APTT: aPTT: 32 seconds (ref 24–36)

## 2020-01-19 LAB — TROPONIN I (HIGH SENSITIVITY)
Troponin I (High Sensitivity): 110 ng/L (ref <18)
Troponin I (High Sensitivity): 9128 ng/L (ref <18)
Troponin I (High Sensitivity): 9840 ng/L (ref <18)
Troponin I (High Sensitivity): 15365 ng/L (ref <18)

## 2020-01-19 LAB — HEPARIN LEVEL (UNFRACTIONATED): Heparin Unfractionated: 0.18 IU/mL — ABNORMAL LOW (ref 0.30–0.70)

## 2020-01-19 LAB — BRAIN NATRIURETIC PEPTIDE: B Natriuretic Peptide: 1511.7 pg/mL — ABNORMAL HIGH (ref 0.0–100.0)

## 2020-01-19 MED ORDER — ISOSORBIDE MONONITRATE ER 30 MG PO TB24
30.0000 mg | ORAL_TABLET | Freq: Every day | ORAL | Status: DC
  Administered 2020-01-19: 30 mg via ORAL
  Filled 2020-01-19: qty 1

## 2020-01-19 MED ORDER — PANTOPRAZOLE SODIUM 40 MG PO TBEC
40.0000 mg | DELAYED_RELEASE_TABLET | Freq: Every day | ORAL | Status: DC
  Administered 2020-01-20: 40 mg via ORAL
  Filled 2020-01-19: qty 1

## 2020-01-19 MED ORDER — CEPHALEXIN 500 MG PO CAPS
500.0000 mg | ORAL_CAPSULE | Freq: Four times a day (QID) | ORAL | Status: DC
  Administered 2020-01-19 – 2020-01-20 (×3): 500 mg via ORAL
  Filled 2020-01-19 (×3): qty 1

## 2020-01-19 MED ORDER — ONDANSETRON HCL 4 MG/2ML IJ SOLN: 4.0000 mg | Freq: Three times a day (TID) | INTRAMUSCULAR | Status: DC | PRN

## 2020-01-19 MED ORDER — IPRATROPIUM-ALBUTEROL 0.5-2.5 (3) MG/3ML IN SOLN
3.0000 mL | Freq: Four times a day (QID) | RESPIRATORY_TRACT | Status: DC
  Administered 2020-01-19 – 2020-01-20 (×2): 3 mL via RESPIRATORY_TRACT
  Filled 2020-01-19 (×3): qty 3

## 2020-01-19 MED ORDER — HYDRALAZINE HCL 20 MG/ML IJ SOLN: 5.0000 mg | INTRAMUSCULAR | Status: DC | PRN

## 2020-01-19 MED ORDER — MOMETASONE FURO-FORMOTEROL FUM 100-5 MCG/ACT IN AERO
2.0000 | INHALATION_SPRAY | Freq: Two times a day (BID) | RESPIRATORY_TRACT | Status: DC
  Administered 2020-01-20: 2 via RESPIRATORY_TRACT
  Filled 2020-01-19: qty 8.8

## 2020-01-19 MED ORDER — DULOXETINE HCL 20 MG PO CPEP
20.0000 mg | ORAL_CAPSULE | Freq: Every day | ORAL | Status: DC
  Administered 2020-01-19 – 2020-01-20 (×2): 20 mg via ORAL
  Filled 2020-01-19 (×2): qty 1

## 2020-01-19 MED ORDER — OXYCODONE HCL 5 MG PO TABS
10.0000 mg | ORAL_TABLET | Freq: Four times a day (QID) | ORAL | Status: DC | PRN
  Administered 2020-01-19 – 2020-01-20 (×2): 10 mg via ORAL
  Filled 2020-01-19 (×2): qty 2

## 2020-01-19 MED ORDER — HEPARIN BOLUS VIA INFUSION
2000.0000 [IU] | Freq: Once | INTRAVENOUS | Status: AC
  Administered 2020-01-19: 2000 [IU] via INTRAVENOUS
  Filled 2020-01-19: qty 2000

## 2020-01-19 MED ORDER — METOPROLOL SUCCINATE ER 50 MG PO TB24
50.0000 mg | ORAL_TABLET | Freq: Every day | ORAL | Status: DC
  Administered 2020-01-19: 50 mg via ORAL
  Filled 2020-01-19: qty 1

## 2020-01-19 MED ORDER — CLOPIDOGREL BISULFATE 75 MG PO TABS
75.0000 mg | ORAL_TABLET | Freq: Every day | ORAL | Status: DC
  Administered 2020-01-19 – 2020-01-20 (×2): 75 mg via ORAL
  Filled 2020-01-19 (×2): qty 1

## 2020-01-19 MED ORDER — SPIRONOLACTONE 25 MG PO TABS
25.0000 mg | ORAL_TABLET | Freq: Every day | ORAL | Status: DC
  Administered 2020-01-19: 25 mg via ORAL
  Filled 2020-01-19: qty 1

## 2020-01-19 MED ORDER — ACETAMINOPHEN 325 MG PO TABS: 650.0000 mg | ORAL_TABLET | Freq: Four times a day (QID) | ORAL | Status: DC | PRN

## 2020-01-19 MED ORDER — GABAPENTIN 400 MG PO CAPS
800.0000 mg | ORAL_CAPSULE | Freq: Four times a day (QID) | ORAL | Status: DC
  Administered 2020-01-20: 800 mg via ORAL
  Filled 2020-01-19: qty 2

## 2020-01-19 MED ORDER — HEPARIN SODIUM (PORCINE) 5000 UNIT/ML IJ SOLN: 5000.0000 [IU] | Freq: Three times a day (TID) | INTRAMUSCULAR | Status: DC

## 2020-01-19 MED ORDER — NITROGLYCERIN 0.4 MG SL SUBL: 0.4000 mg | SUBLINGUAL_TABLET | SUBLINGUAL | Status: DC | PRN

## 2020-01-19 MED ORDER — HEPARIN BOLUS VIA INFUSION
5000.0000 [IU] | Freq: Once | INTRAVENOUS | Status: AC
  Administered 2020-01-19: 5000 [IU] via INTRAVENOUS
  Filled 2020-01-19: qty 5000

## 2020-01-19 MED ORDER — TRAZODONE HCL 50 MG PO TABS
150.0000 mg | ORAL_TABLET | Freq: Every day | ORAL | Status: DC
  Administered 2020-01-19: 150 mg via ORAL
  Filled 2020-01-19: qty 1

## 2020-01-19 MED ORDER — ATORVASTATIN CALCIUM 80 MG PO TABS
80.0000 mg | ORAL_TABLET | Freq: Every day | ORAL | Status: DC
  Administered 2020-01-19 – 2020-01-20 (×2): 80 mg via ORAL
  Filled 2020-01-19 (×2): qty 1

## 2020-01-19 MED ORDER — ASPIRIN EC 81 MG PO TBEC
81.0000 mg | DELAYED_RELEASE_TABLET | Freq: Every day | ORAL | Status: DC
  Administered 2020-01-20: 81 mg via ORAL
  Filled 2020-01-19: qty 1

## 2020-01-19 MED ORDER — HEPARIN (PORCINE) 25000 UT/250ML-% IV SOLN
1500.0000 [IU]/h | INTRAVENOUS | Status: DC
  Administered 2020-01-19: 1300 [IU]/h via INTRAVENOUS
  Filled 2020-01-19 (×2): qty 250

## 2020-01-19 MED ORDER — EZETIMIBE 10 MG PO TABS
10.0000 mg | ORAL_TABLET | Freq: Every day | ORAL | Status: DC
  Administered 2020-01-19 – 2020-01-20 (×2): 10 mg via ORAL
  Filled 2020-01-19 (×2): qty 1

## 2020-01-19 MED ORDER — ALBUTEROL SULFATE (2.5 MG/3ML) 0.083% IN NEBU: 2.5000 mg | INHALATION_SOLUTION | RESPIRATORY_TRACT | Status: DC | PRN

## 2020-01-19 MED ORDER — TORSEMIDE 20 MG PO TABS
60.0000 mg | ORAL_TABLET | Freq: Every day | ORAL | Status: DC
  Administered 2020-01-19: 60 mg via ORAL
  Filled 2020-01-19: qty 3

## 2020-01-19 MED ORDER — NICOTINE 21 MG/24HR TD PT24
21.0000 mg | MEDICATED_PATCH | Freq: Every day | TRANSDERMAL | Status: DC
  Filled 2020-01-19 (×2): qty 1

## 2020-01-19 MED ORDER — MORPHINE SULFATE (PF) 2 MG/ML IV SOLN: 2.0000 mg | INTRAVENOUS | Status: DC | PRN

## 2020-01-19 MED ORDER — INSULIN ASPART 100 UNIT/ML ~~LOC~~ SOLN
0.0000 [IU] | Freq: Every day | SUBCUTANEOUS | Status: DC
  Administered 2020-01-19: 2 [IU] via SUBCUTANEOUS

## 2020-01-19 MED ORDER — INSULIN ASPART 100 UNIT/ML ~~LOC~~ SOLN: 0.0000 [IU] | Freq: Three times a day (TID) | SUBCUTANEOUS | Status: DC

## 2020-01-19 MED ORDER — ADULT MULTIVITAMIN W/MINERALS CH
1.0000 | ORAL_TABLET | Freq: Every day | ORAL | Status: DC
  Administered 2020-01-19 – 2020-01-20 (×2): 1 via ORAL
  Filled 2020-01-19 (×2): qty 1

## 2020-01-19 MED ORDER — SACUBITRIL-VALSARTAN 24-26 MG PO TABS
1.0000 | ORAL_TABLET | Freq: Two times a day (BID) | ORAL | Status: DC
  Administered 2020-01-19: 1 via ORAL
  Filled 2020-01-19: qty 1

## 2020-01-19 NOTE — ED Provider Notes (Signed)
Dr. Mylo Red seeing patient now   Delman Kitten, MD 01/19/20 [phone]

## 2020-01-19 NOTE — ED Triage Notes (Signed)
Arrives via [facility].  C/O left sided chest pain since 0800.  Per EMS VS wnl.  2 NTG given, 324 ASA given and 18g left fa.  EMS reports patient is AFtib, patient unsure if this is new.  AAOx3.  Skin warm and dry. NAD

## 2020-01-19 NOTE — ED Notes (Signed)
This RN transported pt to assigned room 256.

## 2020-01-19 NOTE — ED Notes (Addendum)
MD Garen Lah, cardiology at bedside.

## 2020-01-19 NOTE — ED Notes (Signed)
Trop called to this RN 110.  Agricultural consultant notified.

## 2020-01-19 NOTE — Consult Note (Addendum)
ANTICOAGULATION CONSULT NOTE - Initial Consult  Pharmacy Consult for Heparin infusion dosing and monitoring Indication: atrial fibrillation  Allergies  Allergen Reactions   Dulaglutide Anaphylaxis, Diarrhea and Hives   Other Itching    Skin itching associated with nitro patch   Prednisone Rash    Patient Measurements: Height: 5\' 7"  (170.2 cm) Weight: 89.5 kg (197 lb 5 oz) IBW/kg (Calculated) : 66.1 Heparin Dosing Weight: 85kg  Vital Signs: Temp: 98.9 F (37.2 C) (08/11 0941) Temp Source: Oral (08/11 0941) BP: 114/67 (08/11 0941) Pulse Rate: 93 (08/11 0941)  Labs: Recent Labs    01/19/20 0946  HGB 12.1*  HCT 36.5*  PLT 177  APTT 32  LABPROT 14.5  INR 1.2  CREATININE 1.25*  TROPONINIHS 110*    Estimated Creatinine Clearance: 69.6 mL/min (A) (by C-G formula based on SCr of 1.25 mg/dL (H)).   Medical History: Past Medical History:  Diagnosis Date   Arthritis    Asthma    Atherosclerosis    BPH (benign prostatic hyperplasia)    Carotid arterial disease ([facility])    Charcot's joint of foot, right    Chronic combined systolic (congestive) and diastolic (congestive) heart failure ([place])    a. 02/2013 EF 50% by LV gram; b. 04/2017 Echo: EF 25-30%. diff HK. Gr2 DD. Mod dil LA/RV. PASP 73mmHg.   COPD (chronic obstructive pulmonary disease) ([place])    Coronary artery disease    a. 2013 S/P PCI of LAD [facility]);  b. 03/2013 PCI: RCA 90p ( 2.5 x 23 mm DES); c. 02/2014 Cath: patent RCA stent->Med Rx; d. 04/2017 Cath: LM nl, LAd 30ost/p, 62m/d, D1 80ost, LCX 90p/m, OM1 85, RCA 70/20p, 55m, 70d->Referred for CT Surg-felt to be poor candidate.   Diabetic neuropathy ([place])    Diabetic ulcer of right foot ([place])    Hernia    Hyperlipidemia    Hypertension    Ischemic cardiomyopathy    a. 04/2017 Echo: EF 25-30%; b. 08/2017 Cardiac MRI ([facility]): EF 19%, sev glob HK. RVEF 20%, mod BAE, triv MR, mild-mod TR. Basal lateral subendocardial infarct (viable), inf/infsept, dist  septal ischemia, basal to mid lat peri-infarct ischemia.   Morbid obesity ([place])    Neuropathy    PAD (peripheral artery disease) ([place])    a. Followed by Dr. Lucky Cowboy; b. 08/2010 Periph Angio: RSFA 70-80p (6X60 self-expanding stent); c. 09/2010 Periph Angio: L SFA 100p (7x unknown length self-expanding stent); d. 06/2015 Periph Angio: R SFA short segment occlusion (6x12 self-expanding stent).   Restless leg syndrome    Sleep apnea    Subclavian artery stenosis, right ([facility])    Syncope and collapse    Tobacco use    a. 75+ yr hx - still smoking 1ppd, down from 2 ppd.   Type II diabetes mellitus ([facility])    Varicose vein     Assessment: Pharmacy consulted for heparin infusion dosing and monitoring for 59 yo male for chest pain/NSTEMI/A. Fib.  Patient has elevated troponin and EKG is consistent with NSTEMI. Per ED note, there is concern for new onset A. Fib.   Goal of Therapy:  Heparin level 0.3-0.7 units/ml Monitor platelets by anticoagulation protocol: Yes   Plan:  Give 5000 units bolus x 1 Start heparin infusion at 1300 units/hr Check anti-Xa level in 6 hours and daily while on heparin Continue to monitor H&H and platelets  Pernell Dupre, PharmD, BCPS Clinical Pharmacist 01/19/2020 12:22 PM

## 2020-01-19 NOTE — ED Notes (Signed)
Sandwich tray and ginger-ale provided to pt by this RN.

## 2020-01-19 NOTE — H&P (Signed)
History and Physical    [name] LNL:[id number] DOB: Dec 11, 1960 DOA: 01/19/2020  Referring MD/NP/PA:   PCP: [name], MD   Patient coming from:  The patient is coming from home.  At baseline, pt is independent for most of ADL.        Chief Complaint: chest pain  HPI: [name] [name] is a 59 y.o. male with medical history significant of subclavian artery stenosis, RLS, PAD, obesity, CAD, sCHF with EF 20-25%, BPH, right BKA, who presents with chest pain.  Patient states that his chest pain started at about 8 AM today.  It is located in substernal area and also on the left side of the chest, constant, initially 10 out of 10 severity, currently chest pain is minimal, pressure-like, radiating to the left arm, left hand and the neck.  Patient has cough with clear mucus production.  He also has mild shortness breath, no fever or chills.  Denies nausea, vomiting, diarrhea, abdominal pain, symptoms of UTI or unilateral weakness. Pt also has left lower leg cellulitis.    ED Course: pt was found to have WBC 6.3, INR 1.2, PTT 32, troponin 110 -->9128, BNP 1511, pending COVID-19 PCR, creatinine 1.25, BUN 17, GFR >60, temperature normal, soft blood pressure, heart rate 50, RR 16, oxygen saturation 93 to 95% on room air, chest x-ray negative. Pt was found to have new onset A fib.  Patient is placed on progressive benefit observation. Dr. Garen Lah is consulted.   Review of Systems:   General: no fevers, chills, no body weight gain, has fatigue HEENT: no blurry vision, hearing changes or sore throat Respiratory: has dyspnea, coughing, no wheezing CV: has chest pain, no palpitations GI: no nausea, vomiting, abdominal pain, diarrhea, constipation GU: no dysuria, burning on urination, increased urinary frequency, hematuria  Ext: has left leg edema.  Neuro: no unilateral weakness, numbness, or tingling, no vision change or hearing loss Skin: no rash, no skin tear. MSK: No muscle spasm, no  deformity, no limitation of range of movement in spin Heme: No easy bruising.  Travel history: No recent long distant travel.  Allergy:  Allergies  Allergen Reactions  . Dulaglutide Anaphylaxis, Diarrhea and Hives  . Other Itching    Skin itching associated with nitro patch  . Prednisone Rash    Past Medical History:  Diagnosis Date  . Arthritis   . Asthma   . Atherosclerosis   . BPH (benign prostatic hyperplasia)   . Carotid arterial disease ([place])   . Charcot's joint of foot, right   . Chronic combined systolic (congestive) and diastolic (congestive) heart failure ([place])    a. 02/2013 EF 50% by LV gram; b. 04/2017 Echo: EF 25-30%. diff HK. Gr2 DD. Mod dil LA/RV. PASP 5mmHg.  [name] COPD (chronic obstructive pulmonary disease) ([place])   . Coronary artery disease    a. 2013 S/P PCI of LAD [facility]);  b. 03/2013 PCI: RCA 90p ( 2.5 x 23 mm DES); c. 02/2014 Cath: patent RCA stent->Med Rx; d. 04/2017 Cath: LM nl, LAd 30ost/p, 7m/d, D1 80ost, LCX 90p/m, OM1 85, RCA 70/20p, 57m, 70d->Referred for CT Surg-felt to be poor candidate.  . Diabetic neuropathy ([place])   . Diabetic ulcer of right foot ([place])   . Hernia   . Hyperlipidemia   . Hypertension   . Ischemic cardiomyopathy    a. 04/2017 Echo: EF 25-30%; b. 08/2017 Cardiac MRI ([facility]): EF 19%, sev glob HK. RVEF 20%, mod BAE, triv MR, mild-mod TR. Basal lateral  subendocardial infarct (viable), inf/infsept, dist septal ischemia, basal to mid lat peri-infarct ischemia.  . Morbid obesity ([place])   . Neuropathy   . PAD (peripheral artery disease) ([place])    a. Followed by Dr. Lucky Cowboy; b. 08/2010 Periph Angio: RSFA 70-80p (6X60 self-expanding stent); c. 09/2010 Periph Angio: L SFA 100p (7x unknown length self-expanding stent); d. 06/2015 Periph Angio: R SFA short segment occlusion (6x12 self-expanding stent).  . Restless leg syndrome   . Sleep apnea   . Subclavian artery stenosis, right ([place])   . Syncope and collapse   . Tobacco use    a. 75+ yr hx - still smoking  1ppd, down from 2 ppd.  . Type II diabetes mellitus ([place])   . Varicose vein     Past Surgical History:  Procedure Laterality Date  . ABDOMINAL AORTAGRAM N/A 06/23/2013   Procedure: ABDOMINAL Maxcine Ham;  Surgeon: Wellington Hampshire, MD;  Location: [place] CATH LAB;  Service: Cardiovascular;  Laterality: N/A;  . AMPUTATION Right 04/01/2018   Procedure: AMPUTATION BELOW KNEE;  Surgeon: Algernon Huxley, MD;  Location: [facility] ORS;  Service: Vascular;  Laterality: Right;  . APPENDECTOMY    . CARDIAC CATHETERIZATION  10/14   [facility]; X1 STENT PROXIMAL RCA  . CARDIAC CATHETERIZATION  02/2010   [facility]  . CARDIAC CATHETERIZATION  02/21/2014   [facility]  . CLAVICLE SURGERY    . HERNIA REPAIR    . IRRIGATION AND DEBRIDEMENT FOOT Right 01/04/2018   Procedure: IRRIGATION AND DEBRIDEMENT FOOT;  Surgeon: Samara Deist, DPM;  Location: [facility] ORS;  Service: Podiatry;  Laterality: Right;  . LEFT HEART CATH AND CORS/GRAFTS ANGIOGRAPHY N/A 04/28/2017   Procedure: LEFT HEART CATH AND CORONARY ANGIOGRAPHY;  Surgeon: Wellington Hampshire, MD;  Location: [place] CV LAB;  Service: Cardiovascular;  Laterality: N/A;  . LOWER EXTREMITY ANGIOGRAPHY Right 03/03/2018   Procedure: LOWER EXTREMITY ANGIOGRAPHY;  Surgeon: Katha Cabal, MD;  Location: [place] CV LAB;  Service: Cardiovascular;  Laterality: Right;  . LOWER EXTREMITY ANGIOGRAPHY Left 08/24/2018   Procedure: LOWER EXTREMITY ANGIOGRAPHY;  Surgeon: Algernon Huxley, MD;  Location: [place] CV LAB;  Service: Cardiovascular;  Laterality: Left;  . LOWER EXTREMITY ANGIOGRAPHY Left 06/14/2019   Procedure: LOWER EXTREMITY ANGIOGRAPHY;  Surgeon: Algernon Huxley, MD;  Location: [place] [facility] LAB;  Service: Cardiovascular;  Laterality: Left;  . LOWER EXTREMITY ANGIOGRAPHY Left 08/02/2019   Procedure: LOWER EXTREMITY ANGIOGRAPHY;  Surgeon: Algernon Huxley, MD;  Location: [place] CV LAB;  Service: Cardiovascular;  Laterality: Left;  . PERIPHERAL ARTERIAL STENT GRAFT     x2  left/right  . PERIPHERAL VASCULAR BALLOON ANGIOPLASTY Right 01/06/2018   Procedure: PERIPHERAL VASCULAR BALLOON ANGIOPLASTY;  Surgeon: Katha Cabal, MD;  Location: [place] CV LAB;  Service: Cardiovascular;  Laterality: Right;  . PERIPHERAL VASCULAR CATHETERIZATION N/A 07/06/2015   Procedure: Abdominal Aortogram w/Lower Extremity;  Surgeon: Algernon Huxley, MD;  Location: [place] CV LAB;  Service: Cardiovascular;  Laterality: N/A;  . PERIPHERAL VASCULAR CATHETERIZATION  07/06/2015   Procedure: Lower Extremity Intervention;  Surgeon: Algernon Huxley, MD;  Location: [place] CV LAB;  Service: Cardiovascular;;    Social History:  reports that he has been smoking cigarettes. He has a 41.00 pack-year smoking history. He has never used smokeless tobacco. He reports that he does not drink alcohol and does not use drugs.  Family History:  Family History  Problem Relation Age of Onset  . Heart attack Father   . Heart disease  Father   . Hypertension Father   . Hyperlipidemia Father   . Hypertension Mother   . Hyperlipidemia Mother      Prior to Admission medications   Medication Sig Start Date End Date Taking? Authorizing Provider  albuterol (VENTOLIN HFA) 108 (90 Base) MCG/ACT inhaler Inhale into the lungs every 6 (six) hours as needed for wheezing or shortness of breath.    [provider]  aspirin EC 81 MG tablet Take 1 tablet (81 mg total) by mouth daily. 05/11/15   Wellington Hampshire, MD  atorvastatin (LIPITOR) 80 MG tablet Take 80 mg by mouth daily.    [provider]  clopidogrel (PLAVIX) 75 MG tablet Take 75 mg by mouth daily.  08/22/17   [provider]  collagenase (SANTYL) ointment Apply 1 application topically daily. 08/11/19   Kris Hartmann, NP  DULoxetine (CYMBALTA) 20 MG capsule Take 20 mg by mouth daily.    [provider]  ezetimibe (ZETIA) 10 MG tablet TAKE 1 TABLET BY MOUTH ONCE DAILY 11/07/19   Darylene Price A, FNP  gabapentin  (NEURONTIN) 800 MG tablet Take 800 mg by mouth 4 (four) times daily.  08/14/17 01/18/20  [provider]  ipratropium-albuterol (DUONEB) 0.5-2.5 (3) MG/3ML SOLN Inhale 3 mLs into the lungs every 6 (six) hours as needed (wheezing/sob).     [provider]  isosorbide mononitrate (IMDUR) 30 MG 24 hr tablet TAKE 1 TABLET BY MOUTH ONCE DAILY 11/07/19   Darylene Price A, FNP  metFORMIN (GLUCOPHAGE-XR) 500 MG 24 hr tablet Take 1,000 mg by mouth 2 (two) times daily.     [provider]  metoprolol succinate (TOPROL-XL) 25 MG 24 hr tablet Take 1 tablet (25 mg total) by mouth daily. 07/02/19 08/29/20  Loel Dubonnet, NP  Multiple Vitamin (MULTI-VITAMINS) TABS Take 1 tablet by mouth daily.     [provider]  nitroGLYCERIN (NITROSTAT) 0.4 MG SL tablet Place 1 tablet (0.4 mg total) under the tongue every 5 (five) minutes as needed for chest pain. 05/19/19   Alisa Graff, FNP  omeprazole (PRILOSEC) 40 MG capsule Take 40 mg by mouth daily.    [provider]  Oxycodone HCl 10 MG TABS Take 10 mg by mouth every 6 (six) hours as needed (pain).  01/15/18   [provider]  potassium chloride (KLOR-CON) 10 MEQ tablet Take 1 tablet (10 mEq total) by mouth as directed. Take 1 tablet twice a day on days with metolazone take 4 tablets (40 mEq) 10/04/19 01/18/20  Gollan, Kathlene November, MD  sacubitril-valsartan (ENTRESTO) 24-26 MG Take 1 tablet by mouth 2 (two) times daily. 09/30/19   Shahmehdi, Valeria Batman, MD  spironolactone (ALDACTONE) 25 MG tablet Take 1 tablet (25 mg total) by mouth daily. 10/01/19   ShahmehdiValeria Batman, MD  SYMBICORT 80-4.5 MCG/ACT inhaler Inhale into the lungs. 01/01/20   [provider]  torsemide (DEMADEX) 20 MG tablet Take 3 tablets (60 mg total) by mouth daily. 11/03/19   Loel Dubonnet, NP  traZODone (DESYREL) 150 MG tablet Take 150 mg by mouth at bedtime. 07/12/19   [provider]    Physical Exam: Vitals:   01/19/20 0939 01/19/20 0941  01/19/20 1301 01/19/20 1518  BP:  114/67 (!) 118/95   Pulse:  93 (!) 116 81  Resp:  16 (!) 22 15  Temp:  98.9 F (37.2 C)    TempSrc:  Oral    SpO2:  93% 95% 100%  Weight: 89.5  kg     Height: 5\' 7"  (1.702 m)      General: Not in acute distress HEENT:       Eyes: PERRL, EOMI, no scleral icterus.       ENT: No discharge from the ears and nose, no pharynx injection, no tonsillar enlargement.        Neck: No JVD, no bruit, no mass felt. Heme: No neck lymph node enlargement. Cardiac: S1/S2, irregularly irregular rhythm, No murmurs, No gallops or rubs. Respiratory: No rales, wheezing, rhonchi or rubs. GI: Soft, nondistended, nontender, no rebound pain, no organomegaly, BS present. GU: No hematuria Ext:  1+DP/PT pulse on the left leg. S/p of right BKA.  Has erythema, warmth, tenderness and swelling in the left lower leg, with small skin tear. Musculoskeletal: No joint deformities, No joint redness or warmth, no limitation of ROM in spin. Skin: No rashes.  Neuro: Alert, oriented X3, cranial nerves II-XII grossly intact, moves all extremities normally. Muscle strength 5/5 in all extremities, sensation to light touch intact. Brachial reflex 2+ bilaterally. Knee reflex 1+ bilaterally. Negative Babinski's sign. Normal finger to nose test. Psych: Patient is not psychotic, no suicidal or hemocidal ideation.  Labs on Admission: I have personally reviewed following labs and imaging studies  CBC: Recent Labs  Lab 01/19/20 0946  WBC 6.3  HGB 12.1*  HCT 36.5*  MCV 84.9  PLT 329   Basic Metabolic Panel: Recent Labs  Lab 01/19/20 0946  NA 132*  K 3.5  CL 89*  CO2 32  GLUCOSE 147*  BUN 17  CREATININE 1.25*  CALCIUM 8.6*   GFR: Estimated Creatinine Clearance: 69.6 mL/min (A) (by C-G formula based on SCr of 1.25 mg/dL (H)). Liver Function Tests: No results for input(s): AST, ALT, ALKPHOS, BILITOT, PROT, ALBUMIN in the last 168 hours. No results for input(s): LIPASE, AMYLASE in the  last 168 hours. No results for input(s): AMMONIA in the last 168 hours. Coagulation Profile: Recent Labs  Lab 01/19/20 0946  INR 1.2   Cardiac Enzymes: No results for input(s): CKTOTAL, CKMB, CKMBINDEX, TROPONINI in the last 168 hours. BNP (last 3 results) No results for input(s): PROBNP in the last 8760 hours. HbA1C: No results for input(s): HGBA1C in the last 72 hours. CBG: Recent Labs  Lab 01/18/20 1344 01/19/20 1454  GLUCAP 170* 106*   Lipid Profile: No results for input(s): CHOL, HDL, LDLCALC, TRIG, CHOLHDL, LDLDIRECT in the last 72 hours. Thyroid Function Tests: No results for input(s): TSH, T4TOTAL, FREET4, T3FREE, THYROIDAB in the last 72 hours. Anemia Panel: No results for input(s): VITAMINB12, FOLATE, FERRITIN, TIBC, IRON, RETICCTPCT in the last 72 hours. Urine analysis:    Component Value Date/Time   COLORURINE STRAW (A) 09/10/2019 2028   APPEARANCEUR CLEAR (A) 09/10/2019 2028   APPEARANCEUR Clear 02/06/2014 1412   LABSPEC 1.003 (L) 09/10/2019 2028   LABSPEC 1.015 02/06/2014 1412   PHURINE 6.0 09/10/2019 2028   GLUCOSEU NEGATIVE 09/10/2019 2028   GLUCOSEU Negative 02/06/2014 1412   HGBUR NEGATIVE 09/10/2019 2028   BILIRUBINUR NEGATIVE 09/10/2019 2028   BILIRUBINUR Negative 02/06/2014 [place] 09/10/2019 2028   PROTEINUR NEGATIVE 09/10/2019 2028   NITRITE NEGATIVE 09/10/2019 2028   LEUKOCYTESUR NEGATIVE 09/10/2019 2028   LEUKOCYTESUR Negative 02/06/2014 1412   Sepsis Labs: @LABRCNTIP (procalcitonin:4,lacticidven:4) ) Recent Results (from the past 240 hour(s))  SARS Coronavirus 2 by RT PCR (hospital order, performed in [place] hospital lab) Nasopharyngeal Nasopharyngeal Swab     Status: None   Collection Time:  01/19/20 11:05 AM   Specimen: Nasopharyngeal Swab  Result Value Ref Range Status   SARS Coronavirus 2 NEGATIVE NEGATIVE Final    Comment: (NOTE) SARS-CoV-2 target nucleic acids are NOT DETECTED.  The SARS-CoV-2 RNA is  generally detectable in upper and lower respiratory specimens during the acute phase of infection. The lowest concentration of SARS-CoV-2 viral copies this assay can detect is 250 copies / mL. A negative result does not preclude SARS-CoV-2 infection and should not be used as the sole basis for treatment or other patient management decisions.  A negative result may occur with improper specimen collection / handling, submission of specimen other than nasopharyngeal swab, presence of viral mutation(s) within the areas targeted by this assay, and inadequate number of viral copies (<250 copies / mL). A negative result must be combined with clinical observations, patient history, and epidemiological information.  Fact Sheet for Patients:   StrictlyIdeas.no  Fact Sheet for Healthcare Providers: BankingDealers.co.za  This test is not yet approved or  cleared by the [place] FDA and has been authorized for detection and/or diagnosis of SARS-CoV-2 by FDA under an Emergency Use Authorization (EUA).  This EUA will remain in effect (meaning this test can be used) for the duration of the COVID-19 declaration under Section 564(b)(1) of the Act, 21 U.S.C. section 360bbb-3(b)(1), unless the authorization is terminated or revoked sooner.  Performed at [facility], [address] [place], [place] [address]      Radiological Exams on Admission: DG Chest 2 View  Result Date: 01/19/2020 CLINICAL DATA:  Chest pain EXAM: CHEST - 2 VIEW COMPARISON:  September 25, 2009 FINDINGS: The cardiomediastinal silhouette is unchanged in contour.Atherosclerotic calcifications of the aorta. No pleural effusion. No pneumothorax. No acute pleuroparenchymal abnormality. Visualized abdomen is unremarkable. Status post RIGHT shoulder surgery. Multilevel degenerative changes of the thoracic spine. IMPRESSION: No acute cardiopulmonary abnormality. Electronically Signed    By: Valentino Saxon MD   On: 01/19/2020 10:14     EKG: Independently reviewed.  Atrial fibrillation, heart rate 118, bifascicular block, poor R wave progression  Assessment/Plan Principal Problem:   Chest pain Active Problems:   HLD (hyperlipidemia)   Hypertension   Tobacco use   Chronic systolic CHF (congestive heart failure) (HCC)   Type 2 diabetes mellitus with other specified complication (HCC)   NSTEMI (non-ST elevated myocardial infarction) (HCC)   COPD (chronic obstructive pulmonary disease) (HCC)   CAD (coronary artery disease)   Cellulitis of left lower leg   Atrial fibrillation (HCC)   Chest pain, NSTEMI and hx of CAD: trop 110 -->9128. Dr. Garen Lah is consulted  - place to progressive unit for observation - IV heparin - Trend Trop - Repeat EKG in the am  - prn Nitroglycerin, Morphine, and aspirin, plavix  lipitor, Imdur - Risk factor stratification: will check FLP and A1C  - check UDS - 2d echo  HLD (hyperlipidemia) -Zetia and lipitor  Hypertension -IV hydralazine as needed -Metoprolol -Patient is on torsemide, spironolactone, Entresto for CHF  Tobacco use -Nicotine patch  Chronic systolic CHF (congestive heart failure) ([place]): 2D echo on 09/28/2019 showed EF 20-25%.  BNP 1511, no respiratory distress.  No pulmonary edema chest x-ray. -Continue orsemide, spironolactone, Entresto for CHF  Type 2 diabetes mellitus with other specified complication [facility]): Recent A1c 6.6.  Well controlled.  Patient taking Metformin -Sliding scale insulin  COPD (chronic obstructive pulmonary disease) ([place]): Stable -Bronchodilators  Cellulitis of left lower leg: No fever or leukocytosis.  No sepsis. -wound care consult -  Oral Keflex -Blood culture  Atrial fibrillation-new onset: CHA2DS2-VASc Score is 4, needs anticoagulation. Heart rate  50-110s. -on IV heparin -Increase metoprolol dose from 25 to 50 mg daily per card     DVT ppx: on IV heparin Code Status:  Full code Family Communication: not done, no family member is at bed side.  Disposition Plan:  Anticipate discharge back to previous environment Consults called:  Dr. Garen Lah of card Admission status:  progressive unit for obs      Status is: Observation  The patient remains OBS appropriate and will d/c before 2 midnights.  Dispo: The patient is from: Home              Anticipated d/c is to: Home              Anticipated d/c date is: 1 day              Patient currently is not medically stable to d/c.            Date of Service 01/19/2020    Ivor Costa [facility]   If 7PM-7AM, please contact night-coverage [email or web address] 01/19/2020, 3:33 PM

## 2020-01-19 NOTE — Consult Note (Addendum)
ANTICOAGULATION CONSULT NOTE - Initial Consult  Pharmacy Consult for Heparin infusion dosing and monitoring Indication: atrial fibrillation/chest pain/ACS  Allergies  Allergen Reactions  . Dulaglutide Anaphylaxis, Diarrhea and Hives  . Other Itching    Skin itching associated with nitro patch  . Prednisone Rash    Patient Measurements: Height: 5\' 7"  (170.2 cm) Weight: 87.2 kg (192 lb 4.8 oz) IBW/kg (Calculated) : 66.1 Heparin Dosing Weight: 85kg  Vital Signs: Temp: 98.1 F (36.7 C) (08/11 2052) Temp Source: Oral (08/11 2052) BP: 110/89 (08/11 2052) Pulse Rate: 97 (08/11 2052)  Labs: Recent Labs    01/19/20 0946 01/19/20 0946 01/19/20 1315 01/19/20 1453 01/19/20 1829 01/19/20 2033  HGB 12.1*  --   --   --   --   --   HCT 36.5*  --   --   --   --   --   PLT 177  --   --   --   --   --   APTT 32  --   --   --   --   --   LABPROT 14.5  --   --   --   --   --   INR 1.2  --   --   --   --   --   HEPARINUNFRC  --   --   --   --   --  0.18*  CREATININE 1.25*  --   --   --   --   --   TROPONINIHS 110*   < > 9,128* 9,840* 15,365*  --    < > = values in this interval not displayed.    Estimated Creatinine Clearance: 68.7 mL/min (A) (by C-G formula based on SCr of 1.25 mg/dL (H)).   Medical History: Past Medical History:  Diagnosis Date  . Arthritis   . Asthma   . Atherosclerosis   . BPH (benign prostatic hyperplasia)   . Carotid arterial disease ([place])   . Charcot's joint of foot, right   . Chronic combined systolic (congestive) and diastolic (congestive) heart failure ([place])    a. 02/2013 EF 50% by LV gram; b. 04/2017 Echo: EF 25-30%. diff HK. Gr2 DD. Mod dil LA/RV. PASP 31mmHg.  [name] COPD (chronic obstructive pulmonary disease) ([place])   . Coronary artery disease    a. 2013 S/P PCI of LAD [facility]);  b. 03/2013 PCI: RCA 90p ( 2.5 x 23 mm DES); c. 02/2014 Cath: patent RCA stent->Med Rx; d. 04/2017 Cath: LM nl, LAd 30ost/p, 42m/d, D1 80ost, LCX 90p/m, OM1 85, RCA 70/20p, 41m,  70d->Referred for CT Surg-felt to be poor candidate.  . Diabetic neuropathy ([place])   . Diabetic ulcer of right foot ([place])   . Hernia   . Hyperlipidemia   . Hypertension   . Ischemic cardiomyopathy    a. 04/2017 Echo: EF 25-30%; b. 08/2017 Cardiac MRI ([facility]): EF 19%, sev glob HK. RVEF 20%, mod BAE, triv MR, mild-mod TR. Basal lateral subendocardial infarct (viable), inf/infsept, dist septal ischemia, basal to mid lat peri-infarct ischemia.  . Morbid obesity ([place])   . Neuropathy   . PAD (peripheral artery disease) ([place])    a. Followed by Dr. Lucky Cowboy; b. 08/2010 Periph Angio: RSFA 70-80p (6X60 self-expanding stent); c. 09/2010 Periph Angio: L SFA 100p (7x unknown length self-expanding stent); d. 06/2015 Periph Angio: R SFA short segment occlusion (6x12 self-expanding stent).  . Restless leg syndrome   . Sleep apnea   . Subclavian artery stenosis, right ([place])   .  Syncope and collapse   . Tobacco use    a. 75+ yr hx - still smoking 1ppd, down from 2 ppd.  . Type II diabetes mellitus ([place])   . Varicose vein     Assessment: Pharmacy consulted for heparin infusion dosing and monitoring for 59 yo male for chest pain/NSTEMI/A. Fib.  Patient has elevated troponin and EKG is consistent with NSTEMI. Per ED note, there is concern for new onset A. Fib.   Goal of Therapy:  Heparin level 0.3-0.7 units/ml Monitor platelets by anticoagulation protocol: Yes   Plan:  08/11 @ 2000 HL 0.18 subtherapeutic. Will rebolus heparin 2000 units IV x 1 and increase rate to 1500 units/hr and will recheck HL at 0400, trops continue to rise exponentially will continue to monitor.  Tobie Lords, PharmD, BCPS Clinical Pharmacist 01/19/2020 9:43 PM

## 2020-01-19 NOTE — ED Notes (Signed)
This Rn attempted to provide report w/o success. Floor RN will call back.

## 2020-01-19 NOTE — ED Provider Notes (Signed)
[facility] Emergency Department Provider Note   ____________________________________________   First MD Initiated Contact with Patient 01/19/20 1047     (approximate)  I have reviewed the triage vital signs and the nursing notes.   HISTORY  Chief Complaint Chest pain   HPI A 59 year old patient presents for evaluation of chest pain. Initial onset of pain was approximately 1-3 hours ago. The patient's chest pain is described as heaviness/pressure/tightness and is not worse with exertion. The patient's chest pain is middle- or left-sided, is not well-localized, is not sharp and does radiate to the arms/jaw/neck. The patient does not complain of nausea and denies diaphoresis. The patient has no history of stroke, has no history of peripheral artery disease, has not smoked in the past 90 days, denies any history of treated diabetes, has no relevant family history of coronary artery disease (first degree relative at less than age 110), is not hypertensive, has no history of hypercholesterolemia and does not have an elevated BMI (>=30).   Patient has completely been relieved now.  Reports it went away after he took aspirin for baby aspirin at home and also nitroglycerin  He reports similar symptoms in the past with heart disease.  Is compliant with his medications including use of his heart medicines and did see heart failure doctor yesterday  Past Medical History:  Diagnosis Date  . Arthritis   . Asthma   . Atherosclerosis   . BPH (benign prostatic hyperplasia)   . Carotid arterial disease ([place])   . Charcot's joint of foot, right   . Chronic combined systolic (congestive) and diastolic (congestive) heart failure ([place])    a. 02/2013 EF 50% by LV gram; b. 04/2017 Echo: EF 25-30%. diff HK. Gr2 DD. Mod dil LA/RV. PASP 96mmHg.  [name] COPD (chronic obstructive pulmonary disease) ([place])   . Coronary artery disease    a. 2013 S/P PCI of LAD [facility]);  b. 03/2013 PCI: RCA 90p  ( 2.5 x 23 mm DES); c. 02/2014 Cath: patent RCA stent->Med Rx; d. 04/2017 Cath: LM nl, LAd 30ost/p, 69m/d, D1 80ost, LCX 90p/m, OM1 85, RCA 70/20p, 31m, 70d->Referred for CT Surg-felt to be poor candidate.  . Diabetic neuropathy ([place])   . Diabetic ulcer of right foot ([place])   . Hernia   . Hyperlipidemia   . Hypertension   . Ischemic cardiomyopathy    a. 04/2017 Echo: EF 25-30%; b. 08/2017 Cardiac MRI ([facility]): EF 19%, sev glob HK. RVEF 20%, mod BAE, triv MR, mild-mod TR. Basal lateral subendocardial infarct (viable), inf/infsept, dist septal ischemia, basal to mid lat peri-infarct ischemia.  . Morbid obesity ([place])   . Neuropathy   . PAD (peripheral artery disease) ([place])    a. Followed by Dr. Lucky Cowboy; b. 08/2010 Periph Angio: RSFA 70-80p (6X60 self-expanding stent); c. 09/2010 Periph Angio: L SFA 100p (7x unknown length self-expanding stent); d. 06/2015 Periph Angio: R SFA short segment occlusion (6x12 self-expanding stent).  . Restless leg syndrome   . Sleep apnea   . Subclavian artery stenosis, right ([place])   . Syncope and collapse   . Tobacco use    a. 75+ yr hx - still smoking 1ppd, down from 2 ppd.  . Type II diabetes mellitus ([place])   . Varicose vein     Patient Active Problem List   Diagnosis Date Noted  . Atrial fibrillation with RVR ([place]) 01/19/2020  . Chronic bilateral low back pain with sciatica 10/08/2019  . Cellulitis of left lower leg 09/27/2019  .  Cellulitis of foot, left 09/27/2019  . Hyponatremia 09/27/2019  . Cellulitis of left leg 08/31/2019  . CAD (coronary artery disease) 08/31/2019  . Acute on chronic systolic CHF (congestive heart failure) ([place]) 11/05/2018  . Open toe wound 10/01/2018  . Venous ulcer of left leg ([place]) 08/19/2018  . 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[place]) 04/28/2018  . Sepsis ([place]) 03/30/2018  . Abnormal MRI, shoulder (Right) 02/23/2018  . Abnormal MRI, cervical spine (2016) 02/23/2018  . DDD (degenerative disc disease), cervical 02/23/2018   . Cervical foraminal stenosis (Bilateral) 02/23/2018  . Cervical central spinal stenosis 02/23/2018  . Cervical facet hypertrophy 02/23/2018  . Arthralgia of acromioclavicular joint (Right) 02/23/2018  . Osteoarthritis of  AC (acromioclavicular) joint (Right) 02/23/2018  . Biceps tendinosis of shoulder (Right) 02/23/2018  . Tendinopathy of rotator cuff (Right) 02/23/2018  . Vitamin D deficiency 02/23/2018  . Atherosclerosis of native arteries of the extremities with ulceration ([place]) 02/15/2018  . Peripheral edema 02/05/2018  . Status post peripherally inserted central catheter (PICC) central line placement 02/04/2018  . Chronic lower extremity pain (Primary Area of Pain) (Right) 01/29/2018  . Chronic shoulder pain [facility] Area of Pain) (Right) 01/29/2018  . Chronic foot pain (Secondary Area of Pain) (Right) 01/29/2018  . Chronic pain syndrome 01/29/2018  . Long term current use of opiate analgesic 01/29/2018  . Medication monitoring encounter 01/29/2018  . Disorder of skeletal system 01/29/2018  . Problems influencing health status 01/29/2018  . AKI (acute kidney injury) ([place]) 01/03/2018  . NSTEMI (non-ST elevated myocardial infarction) ([place]) 11/21/2017  . Coronary artery disease of native artery of native heart with stable angina pectoris ([place]) 09/10/2017  . HLD (hyperlipidemia) 09/10/2017  . Angina pectoris ([place]) 09/08/2017  . Angina at rest [facility]) 08/13/2017  . Lymphedema 05/02/2017  . Chronic systolic CHF (congestive heart failure) ([place]) 04/23/2017  . Chest pain 07/11/2016  . Unstable angina ([place]) 07/11/2016  . Foot ulcer (Right)   . Stable angina (HCC)   . Pressure injury of skin 05/06/2016  . Diabetic foot infection ([place]) 05/05/2016  . Type 2 diabetes mellitus with other specified complication ([place]) 74/16/3845  . Cellulitis 11/21/2015  . Morbid obesity ([place]) 11/17/2014  . PAD (peripheral artery disease) ([place])   . Hypertension   . Tobacco use   . COPD (chronic  obstructive pulmonary disease) ([place]) 05/19/2012  . Diabetic neuropathy ([place]) 05/19/2012    Past Surgical History:  Procedure Laterality Date  . ABDOMINAL AORTAGRAM N/A 06/23/2013   Procedure: ABDOMINAL Maxcine Ham;  Surgeon: Wellington Hampshire, MD;  Location: [place] CATH LAB;  Service: Cardiovascular;  Laterality: N/A;  . AMPUTATION Right 04/01/2018   Procedure: AMPUTATION BELOW KNEE;  Surgeon: Algernon Huxley, MD;  Location: [facility] ORS;  Service: Vascular;  Laterality: Right;  . APPENDECTOMY    . CARDIAC CATHETERIZATION  10/14   [facility]; X1 STENT PROXIMAL RCA  . CARDIAC CATHETERIZATION  02/2010   [facility]  . CARDIAC CATHETERIZATION  02/21/2014   [facility]  . CLAVICLE SURGERY    . HERNIA REPAIR    . IRRIGATION AND DEBRIDEMENT FOOT Right 01/04/2018   Procedure: IRRIGATION AND DEBRIDEMENT FOOT;  Surgeon: Samara Deist, DPM;  Location: [facility] ORS;  Service: Podiatry;  Laterality: Right;  . LEFT HEART CATH AND CORS/GRAFTS ANGIOGRAPHY N/A 04/28/2017   Procedure: LEFT HEART CATH AND CORONARY ANGIOGRAPHY;  Surgeon: Wellington Hampshire, MD;  Location: [place] CV LAB;  Service: Cardiovascular;  Laterality: N/A;  . LOWER EXTREMITY ANGIOGRAPHY Right 03/03/2018   Procedure: LOWER EXTREMITY  ANGIOGRAPHY;  Surgeon: Katha Cabal, MD;  Location: [place] CV LAB;  Service: Cardiovascular;  Laterality: Right;  . LOWER EXTREMITY ANGIOGRAPHY Left 08/24/2018   Procedure: LOWER EXTREMITY ANGIOGRAPHY;  Surgeon: Algernon Huxley, MD;  Location: [place] CV LAB;  Service: Cardiovascular;  Laterality: Left;  . LOWER EXTREMITY ANGIOGRAPHY Left 06/14/2019   Procedure: LOWER EXTREMITY ANGIOGRAPHY;  Surgeon: Algernon Huxley, MD;  Location: [place] CV LAB;  Service: Cardiovascular;  Laterality: Left;  . LOWER EXTREMITY ANGIOGRAPHY Left 08/02/2019   Procedure: LOWER EXTREMITY ANGIOGRAPHY;  Surgeon: Algernon Huxley, MD;  Location: [place] CV LAB;  Service: Cardiovascular;  Laterality: Left;  . PERIPHERAL ARTERIAL STENT GRAFT      x2 left/right  . PERIPHERAL VASCULAR BALLOON ANGIOPLASTY Right 01/06/2018   Procedure: PERIPHERAL VASCULAR BALLOON ANGIOPLASTY;  Surgeon: Katha Cabal, MD;  Location: [place] CV LAB;  Service: Cardiovascular;  Laterality: Right;  . PERIPHERAL VASCULAR CATHETERIZATION N/A 07/06/2015   Procedure: Abdominal Aortogram w/Lower Extremity;  Surgeon: Algernon Huxley, MD;  Location: [place] CV LAB;  Service: Cardiovascular;  Laterality: N/A;  . PERIPHERAL VASCULAR CATHETERIZATION  07/06/2015   Procedure: Lower Extremity Intervention;  Surgeon: Algernon Huxley, MD;  Location: [place] CV LAB;  Service: Cardiovascular;;    Prior to Admission medications   Medication Sig Start Date End Date Taking? Authorizing Provider  albuterol (VENTOLIN HFA) 108 (90 Base) MCG/ACT inhaler Inhale into the lungs every 6 (six) hours as needed for wheezing or shortness of breath.    [provider]  aspirin EC 81 MG tablet Take 1 tablet (81 mg total) by mouth daily. 05/11/15   Wellington Hampshire, MD  atorvastatin (LIPITOR) 80 MG tablet Take 80 mg by mouth daily.    [provider]  clopidogrel (PLAVIX) 75 MG tablet Take 75 mg by mouth daily.  08/22/17   [provider]  collagenase (SANTYL) ointment Apply 1 application topically daily. 08/11/19   Kris Hartmann, NP  DULoxetine (CYMBALTA) 20 MG capsule Take 20 mg by mouth daily.    [provider]  ezetimibe (ZETIA) 10 MG tablet TAKE 1 TABLET BY MOUTH ONCE DAILY 11/07/19   Darylene Price A, FNP  gabapentin (NEURONTIN) 800 MG tablet Take 800 mg by mouth 4 (four) times daily.  08/14/17 01/18/20  [provider]  ipratropium-albuterol (DUONEB) 0.5-2.5 (3) MG/3ML SOLN Inhale 3 mLs into the lungs every 6 (six) hours as needed (wheezing/sob).     [provider]  isosorbide mononitrate (IMDUR) 30 MG 24 hr tablet TAKE 1 TABLET BY MOUTH ONCE DAILY 11/07/19   Darylene Price A, FNP  metFORMIN (GLUCOPHAGE-XR) 500 MG 24 hr tablet Take  1,000 mg by mouth 2 (two) times daily.     [provider]  metoprolol succinate (TOPROL-XL) 25 MG 24 hr tablet Take 1 tablet (25 mg total) by mouth daily. 07/02/19 08/29/20  Loel Dubonnet, NP  Multiple Vitamin (MULTI-VITAMINS) TABS Take 1 tablet by mouth daily.     [provider]  nitroGLYCERIN (NITROSTAT) 0.4 MG SL tablet Place 1 tablet (0.4 mg total) under the tongue every 5 (five) minutes as needed for chest pain. 05/19/19   Alisa Graff, FNP  omeprazole (PRILOSEC) 40 MG capsule Take 40 mg by mouth daily.    [provider]  Oxycodone HCl 10 MG TABS Take 10 mg by mouth every 6 (six) hours as needed (pain).  01/15/18   [provider]  potassium chloride (KLOR-CON) 10  MEQ tablet Take 1 tablet (10 mEq total) by mouth as directed. Take 1 tablet twice a day on days with metolazone take 4 tablets (40 mEq) 10/04/19 01/18/20  Gollan, Kathlene November, MD  sacubitril-valsartan (ENTRESTO) 24-26 MG Take 1 tablet by mouth 2 (two) times daily. 09/30/19   Shahmehdi, Valeria Batman, MD  spironolactone (ALDACTONE) 25 MG tablet Take 1 tablet (25 mg total) by mouth daily. 10/01/19   ShahmehdiValeria Batman, MD  SYMBICORT 80-4.5 MCG/ACT inhaler Inhale into the lungs. 01/01/20   [provider]  torsemide (DEMADEX) 20 MG tablet Take 3 tablets (60 mg total) by mouth daily. 11/03/19   Loel Dubonnet, NP  traZODone (DESYREL) 150 MG tablet Take 150 mg by mouth at bedtime. 07/12/19   [provider]    Allergies Dulaglutide, Other, and Prednisone  Family History  Problem Relation Age of Onset  . Heart attack Father   . Heart disease Father   . Hypertension Father   . Hyperlipidemia Father   . Hypertension Mother   . Hyperlipidemia Mother     Social History Social History   Tobacco Use  . Smoking status: Current Every Day Smoker    Packs/day: 1.00    Years: 41.00    Pack years: 41.00    Types: Cigarettes  . Smokeless tobacco: Never Used  Vaping Use  . Vaping Use:  Never used  Substance Use Topics  . Alcohol use: No  . Drug use: No    Review of Systems Constitutional: No fever/chills not vaccinated against Covid Eyes: No visual changes. ENT: No sore throat. Cardiovascular: D see HPI Respiratory: Very mild feeling of shortness of breath Gastrointestinal: No abdominal pain.   Genitourinary: Negative for dysuria. Musculoskeletal: Negative for back pain.  Right lower extremity amputation.  Left leg chronic ulcer, bandaged. Skin: Negative for rash. Neurological: Negative for headaches, areas of focal weakness or numbness.    ____________________________________________   PHYSICAL EXAM:  VITAL SIGNS: ED Triage Vitals  Enc Vitals Group     BP 01/19/20 0941 114/67     Pulse Rate 01/19/20 0941 93     Resp 01/19/20 0941 16     Temp 01/19/20 0941 98.9 F (37.2 C)     Temp Source 01/19/20 0941 Oral     SpO2 01/19/20 0941 93 %     Weight 01/19/20 0939 197 lb 5 oz (89.5 kg)     Height 01/19/20 0939 5\' 7"  (1.702 m)     Head Circumference --      Peak Flow --      Pain Score 01/19/20 0938 7     Pain Loc --      Pain Edu? --      Excl. in [place]? --     Constitutional: Alert and oriented. Well appearing and in no acute distress. Eyes: Conjunctivae are normal. Head: Atraumatic. Nose: No congestion/rhinnorhea. Mouth/Throat: Mucous membranes are moist. Neck: No stridor.  Cardiovascular: Normal rate, regular rhythm. Grossly normal heart sounds.  Good peripheral circulation. Respiratory: Normal respiratory effort.  No retractions. Lungs CTAB. Gastrointestinal: Soft and nontender. No distention. Musculoskeletal: No lower extremity tenderness nor edema.  Left lower extremity bandage, patient has mild edema in this area. Neurologic:  Normal speech and language. No gross focal neurologic deficits are appreciated.  Skin:  Skin is warm, dry and intact. No rash noted. Psychiatric: Mood and affect are normal. Speech and behavior are  normal.  ____________________________________________   LABS (all labs ordered are listed, but only abnormal  results are displayed)  Labs Reviewed  BASIC METABOLIC PANEL - Abnormal; Notable for the following components:      Result Value   Sodium 132 (*)    Chloride 89 (*)    Glucose, Bld 147 (*)    Creatinine, Ser 1.25 (*)    Calcium 8.6 (*)    All other components within normal limits  CBC - Abnormal; Notable for the following components:   Hemoglobin 12.1 (*)    HCT 36.5 (*)    RDW 18.6 (*)    All other components within normal limits  BRAIN NATRIURETIC PEPTIDE - Abnormal; Notable for the following components:   B Natriuretic Peptide 1,511.7 (*)    All other components within normal limits  TROPONIN I (HIGH SENSITIVITY) - Abnormal; Notable for the following components:   Troponin I (High Sensitivity) 110 (*)    All other components within normal limits  SARS CORONAVIRUS 2 BY RT PCR (HOSPITAL ORDER, [place] LAB)  PROTIME-INR  APTT  TROPONIN I (HIGH SENSITIVITY)   ____________________________________________  EKG  Reviewed interpreted by me at 935 Heart rate 120 QRS 129 QTc 440 Atrial fibrillation, right bundle branch block.  Significant T wave depressions 1 aVL, mild probable J-point elevation in lead III.  Compared with previous EKG from April 18, appears similar.  I do not see indication of STEMI  ____________________________________________  RADIOLOGY  DG Chest 2 View  Result Date: 01/19/2020 CLINICAL DATA:  Chest pain EXAM: CHEST - 2 VIEW COMPARISON:  September 25, 2009 FINDINGS: The cardiomediastinal silhouette is unchanged in contour.Atherosclerotic calcifications of the aorta. No pleural effusion. No pneumothorax. No acute pleuroparenchymal abnormality. Visualized abdomen is unremarkable. Status post RIGHT shoulder surgery. Multilevel degenerative changes of the thoracic spine. IMPRESSION: No acute cardiopulmonary abnormality.  Electronically Signed   By: Valentino Saxon MD   On: 01/19/2020 10:14    Chest x-ray reviewed negative for acute. ____________________________________________   PROCEDURES  Procedure(s) performed: None  Procedures  Critical Care performed: Yes, see critical care note(s)  CRITICAL CARE Performed by: Delman Kitten   Total critical care time: 30 minutes  Critical care time was exclusive of separately billable procedures and treating other patients.  Critical care was necessary to treat or prevent imminent or life-threatening deterioration.  Critical care was time spent personally by me on the following activities: development of treatment plan with patient and/or surrogate as well as nursing, discussions with consultants, evaluation of patient's response to treatment, examination of patient, obtaining history from patient or surrogate, ordering and performing treatments and interventions, ordering and review of laboratory studies, ordering and review of radiographic studies, pulse oximetry and re-evaluation of patient's condition.  ____________________________________________   INITIAL IMPRESSION / ASSESSMENT AND PLAN / ED COURSE  Pertinent labs & imaging results that were available during my care of the patient were reviewed by me and considered in my medical decision making (see chart for details).   Differential diagnosis includes, but is not limited to, ACS, aortic dissection, pulmonary embolism, cardiac tamponade, pneumothorax, pneumonia, pericarditis, myocarditis, GI-related causes including esophagitis/gastritis, and musculoskeletal chest wall pain.    Patient presents with symptoms that he reports are consistent with previous cardiac etiology.  Relieved by aspirin nitro currently pain-free at this time.  EKG abnormal at baseline, also noted that his troponin is elevated.  This appears most consistent with NSTEMI.  I have placed consultation with Dr. Mylo Red who advises  cardiology will see and evaluate the patient, have reached out and discussed  case with hospitalist Dr. Blaine Hamper  Patient understanding of plan for admission and further consultation with cardiology.  Currently pain-free.  Concern for new onset A. fib.  Given his elevated troponin, chest pain that he was experiencing, and concern for new A. fib will place patient on heparin as we await admission and cardiology consult.  High concern for ACS        ____________________________________________   FINAL CLINICAL IMPRESSION(S) / ED DIAGNOSES  Final diagnoses:  NSTEMI (non-ST elevated myocardial infarction) [facility])  Atrial fibrillation, unspecified type [facility])        Note:  This document was prepared using Dragon voice recognition software and may include unintentional dictation errors       Delman Kitten, MD 01/19/20 1134

## 2020-01-19 NOTE — ED Notes (Signed)
This Rn calling dietary for pt;s dinner tray.

## 2020-01-19 NOTE — Consult Note (Signed)
Cardiology Consultation:   Patient ID: [name] [name] MRN: [id number]; DOB: 1960-08-05  Admit date: 01/19/2020 Date of Consult: 01/19/2020  Primary Care Provider: [name], MD [facility] Cardiologist: Ida Rogue, MD  [place] Electrophysiologist:  None    Patient Profile:   [name] [name] is a 59 y.o. male with a hx of ICM EF 20-25%, CAD/PCI, tobacco abuse, COPD who is being seen today for the evaluation of chest pain at the request of Dr. Jacqualine Code.  History of Present Illness:   [name] [name] is a 59 year old male with history of multivessel CAD/PCI (LAD, RCA, ICM EF 20 to 25%, PAD s/p L. SFA stent, s/p right BKA, COPD, current smoker x40+ years, who presents due to worsening shortness of breath and chest pain.  Patient states feeling out of breath over the past several months due to current humidity and elevated temperatures.  Over the past 2 days, he has noted left-sided chest pressure not related with exertion prompting him to present to the emergency room.  Patient denies prior strokes or palpitations.  In the ED, ECG showed atrial fibrillation with heart rate 118. initial Troponin was 110.  Patient was started on heparin drip.  Currently denies chest pain on my exam.  Previously considered for CABG, but due to severely reduced EF, he was not deemed a good candidate for surgery.  Past Medical History:  Diagnosis Date  . Arthritis   . Asthma   . Atherosclerosis   . BPH (benign prostatic hyperplasia)   . Carotid arterial disease ([place])   . Charcot's joint of foot, right   . Chronic combined systolic (congestive) and diastolic (congestive) heart failure ([place])    a. 02/2013 EF 50% by LV gram; b. 04/2017 Echo: EF 25-30%. diff HK. Gr2 DD. Mod dil LA/RV. PASP 4mmHg.  [name] COPD (chronic obstructive pulmonary disease) ([place])   . Coronary artery disease    a. 2013 S/P PCI of LAD [facility]);  b. 03/2013 PCI: RCA 90p ( 2.5 x 23 mm DES); c. 02/2014 Cath: patent RCA stent->Med Rx; d.  04/2017 Cath: LM nl, LAd 30ost/p, 19m/d, D1 80ost, LCX 90p/m, OM1 85, RCA 70/20p, 24m, 70d->Referred for CT Surg-felt to be poor candidate.  . Diabetic neuropathy ([place])   . Diabetic ulcer of right foot ([place])   . Hernia   . Hyperlipidemia   . Hypertension   . Ischemic cardiomyopathy    a. 04/2017 Echo: EF 25-30%; b. 08/2017 Cardiac MRI ([facility]): EF 19%, sev glob HK. RVEF 20%, mod BAE, triv MR, mild-mod TR. Basal lateral subendocardial infarct (viable), inf/infsept, dist septal ischemia, basal to mid lat peri-infarct ischemia.  . Morbid obesity ([place])   . Neuropathy   . PAD (peripheral artery disease) ([place])    a. Followed by Dr. Lucky Cowboy; b. 08/2010 Periph Angio: RSFA 70-80p (6X60 self-expanding stent); c. 09/2010 Periph Angio: L SFA 100p (7x unknown length self-expanding stent); d. 06/2015 Periph Angio: R SFA short segment occlusion (6x12 self-expanding stent).  . Restless leg syndrome   . Sleep apnea   . Subclavian artery stenosis, right ([place])   . Syncope and collapse   . Tobacco use    a. 75+ yr hx - still smoking 1ppd, down from 2 ppd.  . Type II diabetes mellitus ([place])   . Varicose vein     Past Surgical History:  Procedure Laterality Date  . ABDOMINAL AORTAGRAM N/A 06/23/2013   Procedure: ABDOMINAL Maxcine Ham;  Surgeon: Wellington Hampshire, MD;  Location: [place] CATH LAB;  Service: Cardiovascular;  Laterality: N/A;  . AMPUTATION Right 04/01/2018   Procedure: AMPUTATION BELOW KNEE;  Surgeon: Algernon Huxley, MD;  Location: [facility] ORS;  Service: Vascular;  Laterality: Right;  . APPENDECTOMY    . CARDIAC CATHETERIZATION  10/14   [facility]; X1 STENT PROXIMAL RCA  . CARDIAC CATHETERIZATION  02/2010   [facility]  . CARDIAC CATHETERIZATION  02/21/2014   [facility]  . CLAVICLE SURGERY    . HERNIA REPAIR    . IRRIGATION AND DEBRIDEMENT FOOT Right 01/04/2018   Procedure: IRRIGATION AND DEBRIDEMENT FOOT;  Surgeon: Samara Deist, DPM;  Location: [facility] ORS;  Service: Podiatry;  Laterality: Right;  . LEFT HEART CATH AND CORS/GRAFTS  ANGIOGRAPHY N/A 04/28/2017   Procedure: LEFT HEART CATH AND CORONARY ANGIOGRAPHY;  Surgeon: Wellington Hampshire, MD;  Location: [place] CV LAB;  Service: Cardiovascular;  Laterality: N/A;  . LOWER EXTREMITY ANGIOGRAPHY Right 03/03/2018   Procedure: LOWER EXTREMITY ANGIOGRAPHY;  Surgeon: Katha Cabal, MD;  Location: [place] CV LAB;  Service: Cardiovascular;  Laterality: Right;  . LOWER EXTREMITY ANGIOGRAPHY Left 08/24/2018   Procedure: LOWER EXTREMITY ANGIOGRAPHY;  Surgeon: Algernon Huxley, MD;  Location: [place] CV LAB;  Service: Cardiovascular;  Laterality: Left;  . LOWER EXTREMITY ANGIOGRAPHY Left 06/14/2019   Procedure: LOWER EXTREMITY ANGIOGRAPHY;  Surgeon: Algernon Huxley, MD;  Location: [place] CV LAB;  Service: Cardiovascular;  Laterality: Left;  . LOWER EXTREMITY ANGIOGRAPHY Left 08/02/2019   Procedure: LOWER EXTREMITY ANGIOGRAPHY;  Surgeon: Algernon Huxley, MD;  Location: [place] CV LAB;  Service: Cardiovascular;  Laterality: Left;  . PERIPHERAL ARTERIAL STENT GRAFT     x2 left/right  . PERIPHERAL VASCULAR BALLOON ANGIOPLASTY Right 01/06/2018   Procedure: PERIPHERAL VASCULAR BALLOON ANGIOPLASTY;  Surgeon: Katha Cabal, MD;  Location: [place] CV LAB;  Service: Cardiovascular;  Laterality: Right;  . PERIPHERAL VASCULAR CATHETERIZATION N/A 07/06/2015   Procedure: Abdominal Aortogram w/Lower Extremity;  Surgeon: Algernon Huxley, MD;  Location: [place] CV LAB;  Service: Cardiovascular;  Laterality: N/A;  . PERIPHERAL VASCULAR CATHETERIZATION  07/06/2015   Procedure: Lower Extremity Intervention;  Surgeon: Algernon Huxley, MD;  Location: [place] CV LAB;  Service: Cardiovascular;;     Home Medications:  Prior to Admission medications   Medication Sig Start Date End Date Taking? Authorizing Provider  aspirin EC 81 MG tablet Take 1 tablet (81 mg total) by mouth daily. 05/11/15  Yes Wellington Hampshire, MD  atorvastatin (LIPITOR) 80 MG tablet Take 80 mg by mouth  daily.   Yes [provider]  clopidogrel (PLAVIX) 75 MG tablet Take 75 mg by mouth daily.  08/22/17  Yes [provider]  collagenase (SANTYL) ointment Apply 1 application topically daily. 08/11/19  Yes Kris Hartmann, NP  DULoxetine (CYMBALTA) 20 MG capsule Take 20 mg by mouth daily.   Yes [provider]  ezetimibe (ZETIA) 10 MG tablet TAKE 1 TABLET BY MOUTH ONCE DAILY Patient taking differently: Take 10 mg by mouth daily.  11/07/19  Yes Hackney, Otila Kluver A, FNP  gabapentin (NEURONTIN) 800 MG tablet Take 800 mg by mouth 4 (four) times daily.  08/14/17 01/19/20 Yes [provider]  isosorbide mononitrate (IMDUR) 30 MG 24 hr tablet TAKE 1 TABLET BY MOUTH ONCE DAILY Patient taking differently: Take 30 mg by mouth daily.  11/07/19  Yes Hackney, Otila Kluver A, FNP  metFORMIN (GLUCOPHAGE-XR) 500 MG 24 hr tablet Take 1,000 mg by mouth 2 (two) times daily.    Yes [provider]  metoprolol succinate (TOPROL-XL) 25 MG 24 hr tablet Take 1 tablet (25 mg total) by mouth daily. 07/02/19 08/29/20 Yes Loel Dubonnet, NP  Multiple Vitamin (MULTI-VITAMINS) TABS Take 1 tablet by mouth daily.    Yes [provider]  nystatin (MYCOSTATIN) 100000 UNIT/ML suspension Take 5 mLs by mouth 4 (four) times daily. 01/01/20  Yes [provider]  omeprazole (PRILOSEC) 40 MG capsule Take 40 mg by mouth daily.   Yes [provider]  Oxycodone HCl 10 MG TABS Take 10 mg by mouth every 6 (six) hours as needed (pain).  01/15/18  Yes [provider]  potassium chloride (KLOR-CON) 10 MEQ tablet Take 1 tablet (10 mEq total) by mouth as directed. Take 1 tablet twice a day on days with metolazone take 4 tablets (40 mEq) 10/04/19 01/19/20 Yes Gollan, Kathlene November, MD  promethazine (PHENERGAN) 25 MG tablet Take 25 mg by mouth every 6 (six) hours as needed. 01/01/20  Yes [provider]  sacubitril-valsartan (ENTRESTO) 24-26 MG Take 1 tablet by mouth 2 (two) times daily.  09/30/19  Yes Shahmehdi, Seyed A, MD  spironolactone (ALDACTONE) 25 MG tablet Take 1 tablet (25 mg total) by mouth daily. 10/01/19  Yes Shahmehdi, Valeria Batman, MD  SYMBICORT 80-4.5 MCG/ACT inhaler Inhale 2 puffs into the lungs daily.  01/01/20  Yes [provider]  torsemide (DEMADEX) 20 MG tablet Take 3 tablets (60 mg total) by mouth daily. 11/03/19  Yes Loel Dubonnet, NP  traZODone (DESYREL) 150 MG tablet Take 150 mg by mouth at bedtime. 07/12/19  Yes [provider]  albuterol (VENTOLIN HFA) 108 (90 Base) MCG/ACT inhaler Inhale into the lungs every 6 (six) hours as needed for wheezing or shortness of breath.    [provider]  ipratropium-albuterol (DUONEB) 0.5-2.5 (3) MG/3ML SOLN Inhale 3 mLs into the lungs every 6 (six) hours as needed (wheezing/sob).     [provider]  nitroGLYCERIN (NITROSTAT) 0.4 MG SL tablet Place 1 tablet (0.4 mg total) under the tongue every 5 (five) minutes as needed for chest pain. 05/19/19   Alisa Graff, FNP    Inpatient Medications: Scheduled Meds: . insulin aspart  0-5 Units Subcutaneous QHS  . insulin aspart  0-9 Units Subcutaneous TID WC  . nicotine  21 mg Transdermal Daily   Continuous Infusions: . heparin 1,300 Units/hr (01/19/20 1339)   PRN Meds: acetaminophen, albuterol, hydrALAZINE, morphine injection, nitroGLYCERIN, ondansetron (ZOFRAN) IV  Allergies:    Allergies  Allergen Reactions  . Dulaglutide Anaphylaxis, Diarrhea and Hives  . Other Itching    Skin itching associated with nitro patch  . Prednisone Rash    Social History:   Social History   Socioeconomic History  . Marital status: Single    Spouse name: Not on file  . Number of children: 0  . Years of education: 85  . Highest education level: 12th grade  Occupational History  . Occupation: on disability  Tobacco Use  . Smoking status: Current Every Day Smoker    Packs/day: 1.00    Years: 41.00    Pack years: 41.00    Types: Cigarettes  .  Smokeless tobacco: Never Used  Vaping Use  . Vaping Use: Never used  Substance and Sexual Activity  . Alcohol use: No  . Drug use: No  . Sexual activity: Yes  Other Topics Concern  . Not on file  Social History Narrative   Lives at home in [place] with girlfriend. Independent at  baseline.   Social Determinants of Health   Financial Resource Strain:   . Difficulty of Paying Living Expenses:   Food Insecurity:   . Worried About Charity fundraiser in the Last Year:   . Arboriculturist in the Last Year:   Transportation Needs:   . Film/video editor (Medical):   [name] Lack of Transportation (Non-Medical):   Physical Activity:   . Days of Exercise per Week:   . Minutes of Exercise per Session:   Stress:   . Feeling of Stress :   Social Connections:   . Frequency of Communication with Friends and Family:   . Frequency of Social Gatherings with Friends and Family:   . Attends Religious Services:   . Active Member of Clubs or Organizations:   . Attends Archivist Meetings:   [name] Marital Status:   Intimate Partner Violence:   . Fear of Current or Ex-Partner:   . Emotionally Abused:   [name] Physically Abused:   . Sexually Abused:     Family History:    Family History  Problem Relation Age of Onset  . Heart attack Father   . Heart disease Father   . Hypertension Father   . Hyperlipidemia Father   . Hypertension Mother   . Hyperlipidemia Mother      ROS:  Please see the history of present illness.   All other ROS reviewed and negative.     Physical Exam/Data:   Vitals:   01/19/20 0939 01/19/20 0941 01/19/20 1301  BP:  114/67 (!) 118/95  Pulse:  93 (!) 116  Resp:  16 (!) 22  Temp:  98.9 F (37.2 C)   TempSrc:  Oral   SpO2:  93% 95%  Weight: 89.5 kg    Height: 5\' 7"  (1.702 m)     No intake or output data in the 24 hours ending 01/19/20 1420 Last 3 Weights 01/19/2020 01/18/2020 11/15/2019  Weight (lbs) 197 lb 5 oz 197 lb 6 oz 199 lb 6 oz  Weight (kg) 89.5  kg 89.529 kg 90.436 kg  Some encounter information is confidential and restricted. Go to Review Flowsheets activity to see all data.     Body mass index is 30.9 kg/m.  General:  Well nourished, well developed, in no acute distress HEENT: normal Lymph: no adenopathy Neck: no JVD Endocrine:  No thryomegaly Vascular: No carotid bruits; FA pulses 2+ bilaterally without bruits  Cardiac: Irregularly irregular, no murmurs Lungs:  clear to auscultation bilaterally, no wheezing, rhonchi or rales  Abd: soft, nontender, no hepatomegaly  Ext: no edema Musculoskeletal: Right BKA noted, left ankle wrapped in wound dressing Skin: warm and dry  Neuro:  CNs 2-12 intact, no focal abnormalities noted Psych:  Normal affect   EKG:  The EKG was personally reviewed and demonstrates: A. fib, right bundle branch block Telemetry:  Telemetry was personally reviewed and demonstrates: Atrial fibrillation  Relevant CV Studies: Cardiac cath: 04/2017 1. Significant three-vessel coronary artery disease with patent stent in the RCA and LAD. There is significant proximal RCA disease before the stent as well as diffuse mid and distal disease in a vessel that appears to be about 2.5 mm in diameter. There is significant bifurcation stenosis in the proximal left circumflex with a large OM1. The LAD has moderate disease. The coronary arteries are moderately calcified and diffusely diseased throughout. 2. Left ventricular angiography was not performed. EF was moderately to severely reduced by echo. 3. Severely elevated left ventricular  end-diastolic pressure at 34 mmHg.  2D Echo 11/06/2018: 1. The left ventricle has severely reduced systolic function, with an ejection fraction of 20-25%. The cavity size was moderately dilated. Indeterminate diastolic filling due to E-A fusion. Left ventricular diffuse hypokinesis. 2. The right ventricle has severely reduced systolic function. The cavity was mildly enlarged. There is  no increase in right ventricular wall thickness. Right ventricular systolic pressure is moderately elevated with an estimated pressure of 59.6 mmHg. 3. Left atrial size was mildly dilated. 4. Right atrial size was mildly dilated. 5. The mitral valve is degenerative. Mild thickening of the mitral valve leaflet. Mild calcification of the mitral valve leaflet. There is mild mitral annular calcification present. 6. The tricuspid valve is degenerative. Tricuspid valve regurgitation is moderate. 7. The aortic valve was not well visualized. Moderate thickening of the aortic valve. Moderate calcification of the aortic valve. 8. The inferior vena cava was dilated in size with <50% respiratory variability.  Laboratory Data:  High Sensitivity Troponin:   Recent Labs  Lab 01/19/20 0946 01/19/20 1315  TROPONINIHS 110* 9,128*     Chemistry Recent Labs  Lab 01/19/20 0946  NA 132*  K 3.5  CL 89*  CO2 32  GLUCOSE 147*  BUN 17  CREATININE 1.25*  CALCIUM 8.6*  GFRNONAA >60  GFRAA >60  ANIONGAP 11    No results for input(s): PROT, ALBUMIN, AST, ALT, ALKPHOS, BILITOT in the last 168 hours. Hematology Recent Labs  Lab 01/19/20 0946  WBC 6.3  RBC 4.30  HGB 12.1*  HCT 36.5*  MCV 84.9  MCH 28.1  MCHC 33.2  RDW 18.6*  PLT 177   BNP Recent Labs  Lab 01/19/20 0946  BNP 1,511.7*    DDimer No results for input(s): DDIMER in the last 168 hours.   Radiology/Studies:  DG Chest 2 View  Result Date: 01/19/2020 CLINICAL DATA:  Chest pain EXAM: CHEST - 2 VIEW COMPARISON:  September 25, 2009 FINDINGS: The cardiomediastinal silhouette is unchanged in contour.Atherosclerotic calcifications of the aorta. No pleural effusion. No pneumothorax. No acute pleuroparenchymal abnormality. Visualized abdomen is unremarkable. Status post RIGHT shoulder surgery. Multilevel degenerative changes of the thoracic spine. IMPRESSION: No acute cardiopulmonary abnormality. Electronically Signed   By: Valentino Saxon MD   On: 01/19/2020 10:14   {   Assessment and Plan:   1. Chestpain, multivessel CAD/PCI -Currently no anginal symptoms. -EKG with A. fib, right bundle.  Initial troponin 110 -Aspirin, Plavix, Lipitor, and Imdur. -Continue to trend troponins until peak -Increase Toprol-XL to 50 mg daily, due to A. Fib. -If symptoms persist, consider left heart cath.  Patient is high risk, multiple PCI's in the past, multivessel disease previously noted. -Heparin drip okay since patient also has atrial fibrillation, deemed new onset.  2.  New onset atrial fibrillation -Increase Toprol to 50 mg daily for better heart rate control -CHA2DS2-VASc score 4 (chf, htn, dm, vasc) -Heparin okay for now while trending trops -NOAC upon discharge  3.  ICM EF 25% -Appears euvolemic -Toprol-XL, PTA Entresto, Aldactone -Torsemide  4. Current smoker -Cessation advised  5. PAD -Aspirin, statin, Plavix, smoking cessation  Signed, Kate Sable, MD  01/19/2020 2:20 PM

## 2020-01-19 NOTE — ED Notes (Signed)
Pt placed on 1L of oxygen [place]. Pt st feeling SHOB hypoxic at 89% on RA.

## 2020-01-20 ENCOUNTER — Observation Stay (HOSPITAL_COMMUNITY)
Admit: 2020-01-20 | Discharge: 2020-01-20 | Disposition: A | Discharge status: Home / Self Care | Payer: Medicare Other | Attending: Internal Medicine | Admitting: Internal Medicine

## 2020-01-20 ENCOUNTER — Encounter
Admission: EM | Discharge status: Against Medical Advice | Payer: Self-pay | Source: Home / Self Care | Attending: Internal Medicine

## 2020-01-20 ENCOUNTER — Encounter: Payer: Self-pay | Admitting: Internal Medicine

## 2020-01-20 DIAGNOSIS — I214 Non-ST elevation (NSTEMI) myocardial infarction: Secondary | ICD-10-CM | POA: Diagnosis present

## 2020-01-20 DIAGNOSIS — E1169 Type 2 diabetes mellitus with other specified complication: Secondary | ICD-10-CM | POA: Diagnosis present

## 2020-01-20 DIAGNOSIS — E1140 Type 2 diabetes mellitus with diabetic neuropathy, unspecified: Secondary | ICD-10-CM | POA: Diagnosis present

## 2020-01-20 DIAGNOSIS — Z794 Long term (current) use of insulin: Secondary | ICD-10-CM

## 2020-01-20 DIAGNOSIS — I110 Hypertensive heart disease with heart failure: Secondary | ICD-10-CM | POA: Diagnosis present

## 2020-01-20 DIAGNOSIS — Z5329 Procedure and treatment not carried out because of patient's decision for other reasons: Secondary | ICD-10-CM | POA: Diagnosis not present

## 2020-01-20 DIAGNOSIS — G2581 Restless legs syndrome: Secondary | ICD-10-CM | POA: Diagnosis present

## 2020-01-20 DIAGNOSIS — J449 Chronic obstructive pulmonary disease, unspecified: Secondary | ICD-10-CM | POA: Diagnosis present

## 2020-01-20 DIAGNOSIS — I4819 Other persistent atrial fibrillation: Secondary | ICD-10-CM

## 2020-01-20 DIAGNOSIS — J432 Centrilobular emphysema: Secondary | ICD-10-CM

## 2020-01-20 DIAGNOSIS — I5042 Chronic combined systolic (congestive) and diastolic (congestive) heart failure: Secondary | ICD-10-CM | POA: Diagnosis present

## 2020-01-20 DIAGNOSIS — I255 Ischemic cardiomyopathy: Secondary | ICD-10-CM | POA: Diagnosis present

## 2020-01-20 DIAGNOSIS — G8929 Other chronic pain: Secondary | ICD-10-CM | POA: Diagnosis present

## 2020-01-20 DIAGNOSIS — L03116 Cellulitis of left lower limb: Secondary | ICD-10-CM | POA: Diagnosis present

## 2020-01-20 DIAGNOSIS — Z20822 Contact with and (suspected) exposure to covid-19: Secondary | ICD-10-CM | POA: Diagnosis present

## 2020-01-20 DIAGNOSIS — Z7982 Long term (current) use of aspirin: Secondary | ICD-10-CM | POA: Diagnosis not present

## 2020-01-20 DIAGNOSIS — I4891 Unspecified atrial fibrillation: Secondary | ICD-10-CM | POA: Diagnosis present

## 2020-01-20 DIAGNOSIS — E11610 Type 2 diabetes mellitus with diabetic neuropathic arthropathy: Secondary | ICD-10-CM | POA: Diagnosis present

## 2020-01-20 DIAGNOSIS — I25118 Atherosclerotic heart disease of native coronary artery with other forms of angina pectoris: Secondary | ICD-10-CM | POA: Diagnosis not present

## 2020-01-20 DIAGNOSIS — I959 Hypotension, unspecified: Secondary | ICD-10-CM | POA: Diagnosis present

## 2020-01-20 DIAGNOSIS — E785 Hyperlipidemia, unspecified: Secondary | ICD-10-CM | POA: Diagnosis present

## 2020-01-20 DIAGNOSIS — D631 Anemia in chronic kidney disease: Secondary | ICD-10-CM | POA: Diagnosis present

## 2020-01-20 DIAGNOSIS — G4733 Obstructive sleep apnea (adult) (pediatric): Secondary | ICD-10-CM | POA: Diagnosis present

## 2020-01-20 DIAGNOSIS — I2510 Atherosclerotic heart disease of native coronary artery without angina pectoris: Secondary | ICD-10-CM | POA: Diagnosis present

## 2020-01-20 DIAGNOSIS — N400 Enlarged prostate without lower urinary tract symptoms: Secondary | ICD-10-CM | POA: Diagnosis present

## 2020-01-20 DIAGNOSIS — E1151 Type 2 diabetes mellitus with diabetic peripheral angiopathy without gangrene: Secondary | ICD-10-CM | POA: Diagnosis present

## 2020-01-20 DIAGNOSIS — F17210 Nicotine dependence, cigarettes, uncomplicated: Secondary | ICD-10-CM | POA: Diagnosis present

## 2020-01-20 DIAGNOSIS — E782 Mixed hyperlipidemia: Secondary | ICD-10-CM

## 2020-01-20 DIAGNOSIS — I10 Essential (primary) hypertension: Secondary | ICD-10-CM | POA: Diagnosis not present

## 2020-01-20 DIAGNOSIS — Z955 Presence of coronary angioplasty implant and graft: Secondary | ICD-10-CM | POA: Diagnosis not present

## 2020-01-20 HISTORY — PX: LEFT HEART CATH AND CORONARY ANGIOGRAPHY: CATH118249

## 2020-01-20 LAB — BASIC METABOLIC PANEL
Anion gap: 9 (ref 5–15)
BUN: 21 mg/dL — ABNORMAL HIGH (ref 6–20)
CO2: 33 mmol/L — ABNORMAL HIGH (ref 22–32)
Calcium: 8.4 mg/dL — ABNORMAL LOW (ref 8.9–10.3)
Chloride: 90 mmol/L — ABNORMAL LOW (ref 98–111)
Creatinine, Ser: 1.23 mg/dL (ref 0.61–1.24)
GFR calc Af Amer: >60 mL/min (ref >60)
GFR calc non Af Amer: >60 mL/min (ref >60)
Glucose, Bld: 120 mg/dL — ABNORMAL HIGH (ref 70–99)
Potassium: 3.5 mmol/L (ref 3.5–5.1)
Sodium: 132 mmol/L — ABNORMAL LOW (ref 135–145)

## 2020-01-20 LAB — LIPID PANEL
Cholesterol: 118 mg/dL (ref 0–200)
HDL: 34 mg/dL — ABNORMAL LOW (ref >40)
LDL Cholesterol: 77 mg/dL (ref 0–99)
Total CHOL/HDL Ratio: 3.5 RATIO
Triglycerides: 37 mg/dL (ref <150)
VLDL: 7 mg/dL (ref 0–40)

## 2020-01-20 LAB — ECHOCARDIOGRAM COMPLETE
AR max vel: 2.18 cm2
AV Area VTI: 2.06 cm2
AV Area mean vel: 1.87 cm2
AV Mean grad: 4 mmHg
AV Peak grad: 6.7 mmHg
Ao pk vel: 1.29 m/s
Area-P 1/2: 5.06 cm2
Calc EF: 9.2 %
Height: 67 in
S' Lateral: 6.04 cm
Single Plane A2C EF: 6.7 %
Single Plane A4C EF: 14.7 %
Weight: 3203.2 oz

## 2020-01-20 LAB — HEPARIN LEVEL (UNFRACTIONATED)
Heparin Unfractionated: 0.32 IU/mL (ref 0.30–0.70)
Heparin Unfractionated: 0.24 IU/mL — ABNORMAL LOW (ref 0.30–0.70)

## 2020-01-20 LAB — GLUCOSE, CAPILLARY
Glucose-Capillary: 118 mg/dL — ABNORMAL HIGH (ref 70–99)
Glucose-Capillary: 95 mg/dL (ref 70–99)
Glucose-Capillary: 151 mg/dL — ABNORMAL HIGH (ref 70–99)

## 2020-01-20 LAB — CBC
HCT: 32 % — ABNORMAL LOW (ref 39.0–52.0)
Hemoglobin: 10.6 g/dL — ABNORMAL LOW (ref 13.0–17.0)
MCH: 28.5 pg (ref 26.0–34.0)
MCHC: 33.1 g/dL (ref 30.0–36.0)
MCV: 86 fL (ref 80.0–100.0)
Platelets: 153 10*3/uL (ref 150–400)
RBC: 3.72 MIL/uL — ABNORMAL LOW (ref 4.22–5.81)
RDW: 18.6 % — ABNORMAL HIGH (ref 11.5–15.5)
WBC: 4.7 10*3/uL (ref 4.0–10.5)
nRBC: 0 % (ref 0.0–0.2)

## 2020-01-20 LAB — TROPONIN I (HIGH SENSITIVITY)
Troponin I (High Sensitivity): 8054 ng/L (ref <18)
Troponin I (High Sensitivity): 7179 ng/L (ref <18)

## 2020-01-20 LAB — HEMOGLOBIN A1C
Hgb A1c MFr Bld: 5.8 % — ABNORMAL HIGH (ref 4.8–5.6)
Mean Plasma Glucose: 120 mg/dL

## 2020-01-20 SURGERY — LEFT HEART CATH AND CORONARY ANGIOGRAPHY
Anesthesia: Moderate Sedation

## 2020-01-20 MED ORDER — HEPARIN SODIUM (PORCINE) 1000 UNIT/ML IJ SOLN
INTRAMUSCULAR | Status: DC | PRN
  Administered 2020-01-20: 4500 [IU] via INTRAVENOUS

## 2020-01-20 MED ORDER — NOREPINEPHRINE BITARTRATE 1 MG/ML IV SOLN
INTRAVENOUS | Status: AC
  Filled 2020-01-20: qty 4

## 2020-01-20 MED ORDER — SODIUM CHLORIDE 0.9% FLUSH: 3.0000 mL | Freq: Two times a day (BID) | INTRAVENOUS | Status: DC

## 2020-01-20 MED ORDER — SODIUM CHLORIDE 0.9% FLUSH: 3.0000 mL | INTRAVENOUS | Status: DC | PRN

## 2020-01-20 MED ORDER — LIDOCAINE HCL (PF) 1 % IJ SOLN
INTRAMUSCULAR | Status: DC | PRN
  Administered 2020-01-20: 2 mL

## 2020-01-20 MED ORDER — VERAPAMIL HCL 2.5 MG/ML IV SOLN
INTRAVENOUS | Status: DC | PRN
  Administered 2020-01-20: 2.5 mg via INTRA_ARTERIAL

## 2020-01-20 MED ORDER — IOHEXOL 300 MG/ML  SOLN
INTRAMUSCULAR | Status: DC | PRN
  Administered 2020-01-20: 70 mL

## 2020-01-20 MED ORDER — MIDAZOLAM HCL 2 MG/2ML IJ SOLN
INTRAMUSCULAR | Status: DC | PRN
  Administered 2020-01-20: 0.5 mg via INTRAVENOUS

## 2020-01-20 MED ORDER — PERFLUTREN LIPID MICROSPHERE
1.0000 mL | INTRAVENOUS | Status: AC | PRN
  Administered 2020-01-20: 2 mL via INTRAVENOUS
  Filled 2020-01-20: qty 10

## 2020-01-20 MED ORDER — HEPARIN (PORCINE) IN NACL 1000-0.9 UT/500ML-% IV SOLN
INTRAVENOUS | Status: AC
  Filled 2020-01-20: qty 1000

## 2020-01-20 MED ORDER — MUPIROCIN 2 % EX OINT
TOPICAL_OINTMENT | Freq: Every day | CUTANEOUS | Status: DC
  Filled 2020-01-20: qty 22

## 2020-01-20 MED ORDER — VERAPAMIL HCL 2.5 MG/ML IV SOLN
INTRAVENOUS | Status: AC
  Filled 2020-01-20: qty 2

## 2020-01-20 MED ORDER — FENTANYL CITRATE (PF) 100 MCG/2ML IJ SOLN
INTRAMUSCULAR | Status: DC | PRN
  Administered 2020-01-20: 12.5 ug via INTRAVENOUS

## 2020-01-20 MED ORDER — DIGOXIN 250 MCG PO TABS
0.2500 mg | ORAL_TABLET | Freq: Once | ORAL | Status: DC
  Filled 2020-01-20: qty 1

## 2020-01-20 MED ORDER — MIDAZOLAM HCL 2 MG/2ML IJ SOLN
INTRAMUSCULAR | Status: AC
  Filled 2020-01-20: qty 2

## 2020-01-20 MED ORDER — FUROSEMIDE 10 MG/ML IJ SOLN: 40.0000 mg | Freq: Two times a day (BID) | INTRAMUSCULAR | Status: DC

## 2020-01-20 MED ORDER — LIDOCAINE HCL (PF) 1 % IJ SOLN
INTRAMUSCULAR | Status: AC
  Filled 2020-01-20: qty 30

## 2020-01-20 MED ORDER — FENTANYL CITRATE (PF) 100 MCG/2ML IJ SOLN
INTRAMUSCULAR | Status: AC
  Filled 2020-01-20: qty 2

## 2020-01-20 MED ORDER — METOPROLOL SUCCINATE ER 25 MG PO TB24: 25.0000 mg | ORAL_TABLET | Freq: Every day | ORAL | Status: DC

## 2020-01-20 MED ORDER — SODIUM CHLORIDE 0.9 % IV SOLN: INTRAVENOUS | Status: DC

## 2020-01-20 MED ORDER — NOREPINEPHRINE 4 MG/250ML-% IV SOLN
0.0000 ug/min | INTRAVENOUS | Status: DC
  Administered 2020-01-20: 2 ug/min via INTRAVENOUS
  Filled 2020-01-20: qty 250

## 2020-01-20 MED ORDER — HEPARIN SODIUM (PORCINE) 1000 UNIT/ML IJ SOLN
INTRAMUSCULAR | Status: AC
  Filled 2020-01-20: qty 1

## 2020-01-20 MED ORDER — SODIUM CHLORIDE 0.9 % IV SOLN: 250.0000 mL | INTRAVENOUS | Status: DC | PRN

## 2020-01-20 MED ORDER — SPIRONOLACTONE 25 MG PO TABS: 12.5000 mg | ORAL_TABLET | Freq: Every day | ORAL | Status: DC

## 2020-01-20 MED ORDER — DIGOXIN 125 MCG PO TABS: 0.1250 mg | ORAL_TABLET | Freq: Every day | ORAL | Status: DC

## 2020-01-20 MED ORDER — SODIUM CHLORIDE 0.9% FLUSH
3.0000 mL | Freq: Two times a day (BID) | INTRAVENOUS | Status: DC
  Administered 2020-01-20: 3 mL via INTRAVENOUS

## 2020-01-20 MED ORDER — HEPARIN (PORCINE) IN NACL 1000-0.9 UT/500ML-% IV SOLN
INTRAVENOUS | Status: DC | PRN
  Administered 2020-01-20: 500 mL

## 2020-01-20 MED ORDER — ENOXAPARIN SODIUM 40 MG/0.4ML ~~LOC~~ SOLN: 40.0000 mg | SUBCUTANEOUS | Status: DC

## 2020-01-20 SURGICAL SUPPLY — 7 items
CATH INFINITI 5FR JK (CATHETERS) ×1 IMPLANT
DEVICE RAD TR BAND REGULAR (VASCULAR PRODUCTS) ×1 IMPLANT
GLIDESHEATH SLEND SS 6F .021 (SHEATH) ×1 IMPLANT
GUIDEWIRE INQWIRE 1.5J.035X260 (WIRE) IMPLANT
INQWIRE 1.5J .035X260CM (WIRE) ×2
KIT MANI 3VAL PERCEP (MISCELLANEOUS) ×2 IMPLANT
PACK CARDIAC CATH (CUSTOM PROCEDURE TRAY) ×2 IMPLANT

## 2020-01-20 NOTE — Consult Note (Signed)
ANTICOAGULATION CONSULT NOTE - Initial Consult  Pharmacy Consult for Heparin infusion dosing and monitoring Indication: atrial fibrillation/chest pain/ACS  Allergies  Allergen Reactions  . Dulaglutide Anaphylaxis, Diarrhea and Hives  . Other Itching    Skin itching associated with nitro patch  . Prednisone Rash    Patient Measurements: Height: 5\' 7"  (170.2 cm) Weight: 90.8 kg (200 lb 3.2 oz) IBW/kg (Calculated) : 66.1 Heparin Dosing Weight: 85kg  Vital Signs: Temp: 98.4 F (36.9 C) (08/12 0544) Temp Source: Oral (08/11 2052) BP: 88/59 (08/12 0544) Pulse Rate: 65 (08/12 0544)  Labs: Recent Labs    01/19/20 0946 01/19/20 0946 01/19/20 1315 01/19/20 1453 01/19/20 1829 01/19/20 2033 01/20/20 0352  HGB 12.1*  --   --   --   --   --  10.6*  HCT 36.5*  --   --   --   --   --  32.0*  PLT 177  --   --   --   --   --  153  APTT 32  --   --   --   --   --   --   LABPROT 14.5  --   --   --   --   --   --   INR 1.2  --   --   --   --   --   --   HEPARINUNFRC  --   --   --   --   --  0.18* 0.32  CREATININE 1.25*  --   --   --   --   --  1.23  TROPONINIHS 110*   < > 9,128* 9,840* 15,365*  --   --    < > = values in this interval not displayed.    Estimated Creatinine Clearance: 71.2 mL/min (by C-G formula based on SCr of 1.23 mg/dL).   Medical History: Past Medical History:  Diagnosis Date  . Arthritis   . Asthma   . Atherosclerosis   . BPH (benign prostatic hyperplasia)   . Carotid arterial disease ([place])   . Charcot's joint of foot, right   . Chronic combined systolic (congestive) and diastolic (congestive) heart failure ([place])    a. 02/2013 EF 50% by LV gram; b. 04/2017 Echo: EF 25-30%. diff HK. Gr2 DD. Mod dil LA/RV. PASP 24mmHg.  [name] COPD (chronic obstructive pulmonary disease) ([place])   . Coronary artery disease    a. 2013 S/P PCI of LAD [facility]);  b. 03/2013 PCI: RCA 90p ( 2.5 x 23 mm DES); c. 02/2014 Cath: patent RCA stent->Med Rx; d. 04/2017 Cath: LM nl, LAd 30ost/p,  25m/d, D1 80ost, LCX 90p/m, OM1 85, RCA 70/20p, 96m, 70d->Referred for CT Surg-felt to be poor candidate.  . Diabetic neuropathy ([place])   . Diabetic ulcer of right foot ([place])   . Hernia   . Hyperlipidemia   . Hypertension   . Ischemic cardiomyopathy    a. 04/2017 Echo: EF 25-30%; b. 08/2017 Cardiac MRI ([facility]): EF 19%, sev glob HK. RVEF 20%, mod BAE, triv MR, mild-mod TR. Basal lateral subendocardial infarct (viable), inf/infsept, dist septal ischemia, basal to mid lat peri-infarct ischemia.  . Morbid obesity ([place])   . Neuropathy   . PAD (peripheral artery disease) ([place])    a. Followed by Dr. Lucky Cowboy; b. 08/2010 Periph Angio: RSFA 70-80p (6X60 self-expanding stent); c. 09/2010 Periph Angio: L SFA 100p (7x unknown length self-expanding stent); d. 06/2015 Periph Angio: R SFA short segment occlusion (6x12 self-expanding stent).  [name]  Restless leg syndrome   . Sleep apnea   . Subclavian artery stenosis, right ([place])   . Syncope and collapse   . Tobacco use    a. 75+ yr hx - still smoking 1ppd, down from 2 ppd.  . Type II diabetes mellitus ([place])   . Varicose vein     Assessment: Pharmacy consulted for heparin infusion dosing and monitoring for 59 yo male for chest pain/NSTEMI/A. Fib.  Patient has elevated troponin and EKG is consistent with NSTEMI. Per ED note, there is concern for new onset A. Fib.   Goal of Therapy:  Heparin level 0.3-0.7 units/ml Monitor platelets by anticoagulation protocol: Yes   Plan:  08/12 @ 0400 HL 0.32 therapeutic. Will continue current rate and will recheck HL at 1000, CBC trending down will continue to monitor.  Tobie Lords, PharmD, BCPS Clinical Pharmacist 01/20/2020 6:46 AM

## 2020-01-20 NOTE — Consult Note (Signed)
[place] for Heparin infusion dosing and monitoring Indication: atrial fibrillation/chest pain/ACS  Allergies  Allergen Reactions  . Dulaglutide Anaphylaxis, Diarrhea and Hives  . Other Itching    Skin itching associated with nitro patch  . Prednisone Rash    Patient Measurements: Height: 5\' 7"  (170.2 cm) Weight: 90.8 kg (200 lb 3.2 oz) IBW/kg (Calculated) : 66.1 Heparin Dosing Weight: 85kg  Vital Signs: Temp: 97.6 F (36.4 C) (08/12 0844) Temp Source: Oral (08/12 0844) BP: 89/65 (08/12 0844) Pulse Rate: 56 (08/12 0844)  Labs: Recent Labs    01/19/20 0946 01/19/20 1315 01/19/20 1829 01/19/20 2033 01/20/20 0352 01/20/20 0936 01/20/20 1109  HGB 12.1*  --   --   --  10.6*  --   --   HCT 36.5*  --   --   --  32.0*  --   --   PLT 177  --   --   --  153  --   --   APTT 32  --   --   --   --   --   --   LABPROT 14.5  --   --   --   --   --   --   INR 1.2  --   --   --   --   --   --   HEPARINUNFRC  --   --   --  0.18* 0.32 0.24*  --   CREATININE 1.25*  --   --   --  1.23  --   --   TROPONINIHS 110*   < > 15,365*  --   --  8,054* 7,179*   < > = values in this interval not displayed.    Estimated Creatinine Clearance: 71.2 mL/min (by C-G formula based on SCr of 1.23 mg/dL).   Medical History: Past Medical History:  Diagnosis Date  . Arthritis   . Asthma   . Atherosclerosis   . BPH (benign prostatic hyperplasia)   . Carotid arterial disease ([place])   . Charcot's joint of foot, right   . Chronic combined systolic (congestive) and diastolic (congestive) heart failure ([place])    a. 02/2013 EF 50% by LV gram; b. 04/2017 Echo: EF 25-30%. diff HK. Gr2 DD. Mod dil LA/RV. PASP 89mmHg.  [name] COPD (chronic obstructive pulmonary disease) ([place])   . Coronary artery disease    a. 2013 S/P PCI of LAD [facility]);  b. 03/2013 PCI: RCA 90p ( 2.5 x 23 mm DES); c. 02/2014 Cath: patent RCA stent->Med Rx; d. 04/2017 Cath: LM nl, LAd 30ost/p, 17m/d, D1 80ost,  LCX 90p/m, OM1 85, RCA 70/20p, 61m, 70d->Referred for CT Surg-felt to be poor candidate.  . Diabetic neuropathy ([place])   . Diabetic ulcer of right foot ([place])   . Hernia   . Hyperlipidemia   . Hypertension   . Ischemic cardiomyopathy    a. 04/2017 Echo: EF 25-30%; b. 08/2017 Cardiac MRI ([facility]): EF 19%, sev glob HK. RVEF 20%, mod BAE, triv MR, mild-mod TR. Basal lateral subendocardial infarct (viable), inf/infsept, dist septal ischemia, basal to mid lat peri-infarct ischemia.  . Morbid obesity ([place])   . Neuropathy   . PAD (peripheral artery disease) ([place])    a. Followed by Dr. Lucky Cowboy; b. 08/2010 Periph Angio: RSFA 70-80p (6X60 self-expanding stent); c. 09/2010 Periph Angio: L SFA 100p (7x unknown length self-expanding stent); d. 06/2015 Periph Angio: R SFA short segment occlusion (6x12 self-expanding stent).  . Restless leg syndrome   .  Sleep apnea   . Subclavian artery stenosis, right ([place])   . Syncope and collapse   . Tobacco use    a. 75+ yr hx - still smoking 1ppd, down from 2 ppd.  . Type II diabetes mellitus ([place])   . Varicose vein     Assessment: Pharmacy consulted for heparin infusion dosing and monitoring for 59 yo male for chest pain/NSTEMI/A. Fib.  Patient has elevated troponin and EKG is consistent with NSTEMI. Per ED note, there is concern for new onset A. Fib. Pt refused CABG prior. Most likely will need to be transferred to [facility] depending on diagnostic LHC today at [facility].   8/12 0352 HL 0.32  8/12 0936 HL 0.24   Goal of Therapy:  Heparin level 0.3-0.7 units/ml Monitor platelets by anticoagulation protocol: Yes   Plan:  Heparin level was slightly subtherapeutic. Will continue current rate (1500 units/hr) as plan for cath at noon. Will follow up with heparin plan post-cath and order levels.   Oswald Hillock, PharmD, BCPS Clinical Pharmacist 01/20/2020 12:12 PM

## 2020-01-20 NOTE — OR Nursing (Signed)
Pt aggitated, demanding we stop the levophed and he wants to go home he called his wife to come pick him up. Dr Philmore Pali aware that pt wants to leave AMA. Levophed drip tapered off. Plan to transfer him back to his room at 6:15 and prepare to discharge from there. Pt belongings in room.

## 2020-01-20 NOTE — Progress Notes (Signed)
Dr Fletcher Anon notified of pt being hypotensive. No new orders received at this time. MD suggested attempting BP retake on the right arm. BP is improved. MD stated that pt's central pressure was better that the peripheral. Advisd MD that I will attempt retake, reposition and have pt sit up and have PO intake to avoid meds.

## 2020-01-20 NOTE — Progress Notes (Signed)
Levophed ordered per Dr. Rockey Situ.

## 2020-01-20 NOTE — Interval H&P Note (Signed)
Cath Lab Visit (complete for each Cath Lab visit)  Clinical Evaluation Leading to the Procedure:   ACS: Yes.    Non-ACS:  n/a   History and Physical Interval Note:  01/20/2020 1:26 PM  [name] [name]  has presented today for surgery, with the diagnosis of non ST segment myocardial infarction.  The various methods of treatment have been discussed with the patient and family. After consideration of risks, benefits and other options for treatment, the patient has consented to  Procedure(s): LEFT HEART CATH AND CORS/GRAFTS ANGIOGRAPHY (N/A) as a surgical intervention.  The patient's history has been reviewed, patient examined, no change in status, stable for surgery.  I have reviewed the patient's chart and labs.  Questions were answered to the patient's satisfaction.     [name]

## 2020-01-20 NOTE — Consult Note (Signed)
WOC Nurse Consult Note: Reason for Consult:animal scratch to left lower leg, resulting in cellulitis  Wound type:trauma Pressure Injury POA: NA Measurement: 6 cm x 3 cm x 0.2 cm  Wound HTV:GVSY pink dry. Drainage (amount, consistency, odor) minimal serosanguinous no odor.  Periwound:erythema and edema Dressing procedure/placement/frequency: Cleanse wound left lower leg with NS and pat dry.  Apply mupirocin to wound bed. Cover with vaseline gauze and kerlix/tape.  Change daily. Will not follow at this time.  Please re-consult if needed.  Domenic Moras MSN, RN, FNP-BC CWON Wound, Ostomy, Continence Nurse Pager [phone]

## 2020-01-20 NOTE — H&P (View-Only) (Signed)
Progress Note  Patient Name: [name] [name] Date of Encounter: 01/20/2020  Primary Cardiologist: Rockey Situ  Subjective   No chest pain or dyspnea. HS-Tn has trended to > 15,000. BP soft in the 35T to 73U systolic this morning. He remains in Afib with ventricular rates in the 60s bpm.   Inpatient Medications    Scheduled Meds: . aspirin EC  81 mg Oral Daily  . atorvastatin  80 mg Oral Daily  . cephALEXin  500 mg Oral Q6H  . clopidogrel  75 mg Oral Daily  . DULoxetine  20 mg Oral Daily  . ezetimibe  10 mg Oral Daily  . gabapentin  800 mg Oral QID  . insulin aspart  0-5 Units Subcutaneous QHS  . insulin aspart  0-9 Units Subcutaneous TID WC  . ipratropium-albuterol  3 mL Inhalation Q6H  . isosorbide mononitrate  30 mg Oral Daily  . metoprolol succinate  50 mg Oral Daily  . mometasone-formoterol  2 puff Inhalation BID  . multivitamin with minerals  1 tablet Oral Daily  . nicotine  21 mg Transdermal Daily  . pantoprazole  40 mg Oral Daily  . sacubitril-valsartan  1 tablet Oral BID  . spironolactone  25 mg Oral Daily  . torsemide  60 mg Oral Daily  . traZODone  150 mg Oral QHS   Continuous Infusions: . heparin 1,500 Units/hr (01/19/20 2247)   PRN Meds: acetaminophen, albuterol, hydrALAZINE, morphine injection, nitroGLYCERIN, ondansetron (ZOFRAN) IV, oxyCODONE   Vital Signs    Vitals:   01/20/20 0241 01/20/20 0527 01/20/20 0544 01/20/20 0823  BP:  (!) 64/49 (!) 88/59 94/71  Pulse:  66 65 74  Resp:   18   Temp:   98.4 F (36.9 C)   TempSrc:      SpO2: 98% 99% 99%   Weight:   90.8 kg   Height:        Intake/Output Summary (Last 24 hours) at 01/20/2020 0841 Last data filed at 01/20/2020 0554 Gross per 24 hour  Intake 108.68 ml  Output 500 ml  Net -391.32 ml   Filed Weights   01/19/20 0939 01/19/20 1826 01/20/20 0544  Weight: 89.5 kg 87.2 kg 90.8 kg    Telemetry    Afib, 60s bpm - Personally Reviewed  ECG    Afib, 65 bpm, RBBB, nonspecific lateral st/t  changes - Personally Reviewed  Physical Exam   GEN: No acute distress.   Neck: No JVD. Cardiac: Irregularly irregular, I/VI systolic murmur USB, no rubs, or gallops.  Respiratory: Clear to auscultation bilaterally.  GI: Soft, nontender, non-distended.   MS: No edema; Status post R-BKA. Neuro:  Alert and oriented x 3; Nonfocal.  Psych: Normal affect.  Labs    Chemistry Recent Labs  Lab 01/19/20 0946 01/20/20 0352  NA 132* 132*  K 3.5 3.5  CL 89* 90*  CO2 32 33*  GLUCOSE 147* 120*  BUN 17 21*  CREATININE 1.25* 1.23  CALCIUM 8.6* 8.4*  GFRNONAA >60 >60  GFRAA >60 >60  ANIONGAP 11 9     Hematology Recent Labs  Lab 01/19/20 0946 01/20/20 0352  WBC 6.3 4.7  RBC 4.30 3.72*  HGB 12.1* 10.6*  HCT 36.5* 32.0*  MCV 84.9 86.0  MCH 28.1 28.5  MCHC 33.2 33.1  RDW 18.6* 18.6*  PLT 177 153    Cardiac EnzymesNo results for input(s): TROPONINI in the last 168 hours. No results for input(s): TROPIPOC in the last 168 hours.   BNP Recent  Labs  Lab 01/19/20 0946  BNP 1,511.7*     DDimer No results for input(s): DDIMER in the last 168 hours.   Radiology    DG Chest 2 View  Result Date: 01/19/2020 IMPRESSION: No acute cardiopulmonary abnormality. Electronically Signed   By: Valentino Saxon MD   On: 01/19/2020 10:14    Cardiac Studies   2D echo 01/20/2020: Pending __________  2D echo 09/2019: 1. Left ventricular ejection fraction, by estimation, is 20 to 25%. The  left ventricle has severely decreased function. The left ventricle  demonstrates global hypokinesis. The left ventricular internal cavity size  was severely dilated. There is mild  left ventricular hypertrophy. Indeterminate diastolic filling due to E-A  fusion.  2. Right ventricular systolic function is moderately reduced. The right  ventricular size is mildly enlarged. There is moderately elevated  pulmonary artery systolic pressure. The estimated right ventricular  systolic pressure is 61.6  mmHg.  3. Left atrial size was mildly dilated.  4. Right atrial size was moderately dilated.  5. The mitral valve is degenerative. Mild mitral valve regurgitation. No  evidence of mitral stenosis.  6. Tricuspid valve regurgitation is mild to moderate.  7. The aortic valve was not well visualized. Aortic valve regurgitation  is not visualized. Mild aortic valve sclerosis is present, with no  evidence of aortic valve stenosis.  8. The inferior vena cava is dilated in size with <50% respiratory  variability, suggesting right atrial pressure of 15 mmHg. __________  LHC 04/2017:  Prox RCA-1 lesion is 70% stenosed.  Prox RCA-2 lesion is 20% stenosed.  Mid RCA lesion is 80% stenosed.  Mid RCA to Dist RCA lesion is 70% stenosed.  Ost LAD to Prox LAD lesion is 30% stenosed.  Ost 1st Diag to 1st Diag lesion is 80% stenosed.  Prox LAD to Mid LAD lesion is 60% stenosed.  Dist LAD lesion is 60% stenosed.  Prox Cx to Mid Cx lesion is 90% stenosed.  Ost 1st Mrg to 1st Mrg lesion is 85% stenosed.   1.  Significant three-vessel coronary artery disease with patent stent in the RCA and LAD.  There is significant proximal RCA disease before the stent as well as diffuse mid and distal disease in a vessel that appears to be about 2.5 mm in diameter.  There is significant bifurcation stenosis in the proximal left circumflex with a large OM1.  The LAD has moderate disease. The coronary arteries are moderately calcified and diffusely diseased throughout. 2.  Left ventricular angiography was not performed.  EF was moderately to severely reduced by echo. 3.  Severely elevated left ventricular end-diastolic pressure at 34 mmHg.  Recommendations: This is overall a difficult situation.  Revascularization options are somewhat limited due to diffuse disease overall.  Given that he is diabetic and has cardiomyopathy, best option might be CABG if he is found to be a candidate.  We need to optimize his  heart failure first with outpatient referral to cardiothoracic surgery for evaluation.  I am going to increase intravenous diuresis today and the patient possibly can be discharged home tomorrow on medical therapy.   Patient Profile     59 y.o. 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   Assessment & Plan    1. Multivessel CAD s/p prior PCI with prior refusal of CABG with NSTEMI: -Currently without chest pain -HS-Tn has trended to > 15,000, cycle until peak -ASA -Heparin gtt -Ideally, would perform LHC at [facility] with support apparatus, however he declines this and wants a diagnostic LHC at [facility] today with possible transfer to [name] afterwards -ASA -Plavix -Lipitor/Zetia -Risks and benefits of cardiac catheterization have been discussed with the patient including risks of bleeding, bruising, infection, kidney damage, stroke, heart attack, urgent need for bypass, injury to a limb, and death. The patient understands these risks and is willing to proceed with the procedure. All questions have been answered and concerns listened to  2. Chronic combined systolic and diastolic CHF/ICM: -NYHA class III -With hypotension, will hold Entresto, Toprol XL, spironolactone, Imdur, and torsemide for systolic BP < 948 mmHg -Will need evaluation by the advanced heart failure service  -Daily weights -Strict I/O  3. New onset  Afib: -Onset is uncertain -He remains in Afib with controlled ventricular response -CHADS2VASc 4 (CHF, HTN, DM, vascular disease) -Heparin gtt -Will need DOAC at time of hospital discharge  -Metoprolol as BP allows for rate control -Will need to pursue rhythm control strategy pending the above workup down the road   4. Hypotension: -Cannot exclude low output given his CM with EF ~ 14% by preliminary echo this admission vs sepsis with lower extremity ulceration -BP trending up this morning -May need inotropic/pressor support, deferred at this time as discussed with MD  5. PAD: -Status post prior stenting and R-BKA -Followed by vascular surgery as an outpatient   6. DM2: -A1c 5.8 -Metformin held -SSI  7. HTN: -BP soft with medications held as above  8. HLD: -LDL 77 with goal less than 70 -Lipitor/Zetia -Consider PCSK-9 inhibitor as an outpatient   9. Tobacco use: -Cessation advised   10. Anemia of chronic disease: -Stable  For questions or updates, please contact [place] Please consult [email or web address] for contact info under Cardiology/STEMI.    Signed, Christell Faith, PA-C [place] Pager: [phone] 01/20/2020, 8:41 AM

## 2020-01-20 NOTE — Progress Notes (Signed)
Pt BP has improved on only 80mcg of levophed. Pt is at his baseline.

## 2020-01-20 NOTE — Progress Notes (Signed)
Progress Note  Patient Name: [name] [name] Date of Encounter: 01/20/2020  Primary Cardiologist: Rockey Situ  Subjective   No chest pain or dyspnea. HS-Tn has trended to > 15,000. BP soft in the 35K to 09F systolic this morning. He remains in Afib with ventricular rates in the 60s bpm.   Inpatient Medications    Scheduled Meds: . aspirin EC  81 mg Oral Daily  . atorvastatin  80 mg Oral Daily  . cephALEXin  500 mg Oral Q6H  . clopidogrel  75 mg Oral Daily  . DULoxetine  20 mg Oral Daily  . ezetimibe  10 mg Oral Daily  . gabapentin  800 mg Oral QID  . insulin aspart  0-5 Units Subcutaneous QHS  . insulin aspart  0-9 Units Subcutaneous TID WC  . ipratropium-albuterol  3 mL Inhalation Q6H  . isosorbide mononitrate  30 mg Oral Daily  . metoprolol succinate  50 mg Oral Daily  . mometasone-formoterol  2 puff Inhalation BID  . multivitamin with minerals  1 tablet Oral Daily  . nicotine  21 mg Transdermal Daily  . pantoprazole  40 mg Oral Daily  . sacubitril-valsartan  1 tablet Oral BID  . spironolactone  25 mg Oral Daily  . torsemide  60 mg Oral Daily  . traZODone  150 mg Oral QHS   Continuous Infusions: . heparin 1,500 Units/hr (01/19/20 2247)   PRN Meds: acetaminophen, albuterol, hydrALAZINE, morphine injection, nitroGLYCERIN, ondansetron (ZOFRAN) IV, oxyCODONE   Vital Signs    Vitals:   01/20/20 0241 01/20/20 0527 01/20/20 0544 01/20/20 0823  BP:  (!) 64/49 (!) 88/59 94/71  Pulse:  66 65 74  Resp:   18   Temp:   98.4 F (36.9 C)   TempSrc:      SpO2: 98% 99% 99%   Weight:   90.8 kg   Height:        Intake/Output Summary (Last 24 hours) at 01/20/2020 0841 Last data filed at 01/20/2020 0554 Gross per 24 hour  Intake 108.68 ml  Output 500 ml  Net -391.32 ml   Filed Weights   01/19/20 0939 01/19/20 1826 01/20/20 0544  Weight: 89.5 kg 87.2 kg 90.8 kg    Telemetry    Afib, 60s bpm - Personally Reviewed  ECG    Afib, 65 bpm, RBBB, nonspecific lateral st/t  changes - Personally Reviewed  Physical Exam   GEN: No acute distress.   Neck: No JVD. Cardiac: Irregularly irregular, I/VI systolic murmur USB, no rubs, or gallops.  Respiratory: Clear to auscultation bilaterally.  GI: Soft, nontender, non-distended.   MS: No edema; Status post R-BKA. Neuro:  Alert and oriented x 3; Nonfocal.  Psych: Normal affect.  Labs    Chemistry Recent Labs  Lab 01/19/20 0946 01/20/20 0352  NA 132* 132*  K 3.5 3.5  CL 89* 90*  CO2 32 33*  GLUCOSE 147* 120*  BUN 17 21*  CREATININE 1.25* 1.23  CALCIUM 8.6* 8.4*  GFRNONAA >60 >60  GFRAA >60 >60  ANIONGAP 11 9     Hematology Recent Labs  Lab 01/19/20 0946 01/20/20 0352  WBC 6.3 4.7  RBC 4.30 3.72*  HGB 12.1* 10.6*  HCT 36.5* 32.0*  MCV 84.9 86.0  MCH 28.1 28.5  MCHC 33.2 33.1  RDW 18.6* 18.6*  PLT 177 153    Cardiac EnzymesNo results for input(s): TROPONINI in the last 168 hours. No results for input(s): TROPIPOC in the last 168 hours.   BNP Recent  Labs  Lab 01/19/20 0946  BNP 1,511.7*     DDimer No results for input(s): DDIMER in the last 168 hours.   Radiology    DG Chest 2 View  Result Date: 01/19/2020 IMPRESSION: No acute cardiopulmonary abnormality. Electronically Signed   By: Valentino Saxon MD   On: 01/19/2020 10:14    Cardiac Studies   2D echo 01/20/2020: Pending __________  2D echo 09/2019: 1. Left ventricular ejection fraction, by estimation, is 20 to 25%. The  left ventricle has severely decreased function. The left ventricle  demonstrates global hypokinesis. The left ventricular internal cavity size  was severely dilated. There is mild  left ventricular hypertrophy. Indeterminate diastolic filling due to E-A  fusion.  2. Right ventricular systolic function is moderately reduced. The right  ventricular size is mildly enlarged. There is moderately elevated  pulmonary artery systolic pressure. The estimated right ventricular  systolic pressure is 53.6  mmHg.  3. Left atrial size was mildly dilated.  4. Right atrial size was moderately dilated.  5. The mitral valve is degenerative. Mild mitral valve regurgitation. No  evidence of mitral stenosis.  6. Tricuspid valve regurgitation is mild to moderate.  7. The aortic valve was not well visualized. Aortic valve regurgitation  is not visualized. Mild aortic valve sclerosis is present, with no  evidence of aortic valve stenosis.  8. The inferior vena cava is dilated in size with <50% respiratory  variability, suggesting right atrial pressure of 15 mmHg. __________  LHC 04/2017:  Prox RCA-1 lesion is 70% stenosed.  Prox RCA-2 lesion is 20% stenosed.  Mid RCA lesion is 80% stenosed.  Mid RCA to Dist RCA lesion is 70% stenosed.  Ost LAD to Prox LAD lesion is 30% stenosed.  Ost 1st Diag to 1st Diag lesion is 80% stenosed.  Prox LAD to Mid LAD lesion is 60% stenosed.  Dist LAD lesion is 60% stenosed.  Prox Cx to Mid Cx lesion is 90% stenosed.  Ost 1st Mrg to 1st Mrg lesion is 85% stenosed.   1.  Significant three-vessel coronary artery disease with patent stent in the RCA and LAD.  There is significant proximal RCA disease before the stent as well as diffuse mid and distal disease in a vessel that appears to be about 2.5 mm in diameter.  There is significant bifurcation stenosis in the proximal left circumflex with a large OM1.  The LAD has moderate disease. The coronary arteries are moderately calcified and diffusely diseased throughout. 2.  Left ventricular angiography was not performed.  EF was moderately to severely reduced by echo. 3.  Severely elevated left ventricular end-diastolic pressure at 34 mmHg.  Recommendations: This is overall a difficult situation.  Revascularization options are somewhat limited due to diffuse disease overall.  Given that he is diabetic and has cardiomyopathy, best option might be CABG if he is found to be a candidate.  We need to optimize his  heart failure first with outpatient referral to cardiothoracic surgery for evaluation.  I am going to increase intravenous diuresis today and the patient possibly can be discharged home tomorrow on medical therapy.   Patient Profile     59 y.o. 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   Assessment & Plan    1. Multivessel CAD s/p prior PCI with prior refusal of CABG with NSTEMI: -Currently without chest pain -HS-Tn has trended to > 15,000, cycle until peak -ASA -Heparin gtt -Ideally, would perform LHC [facility] with support apparatus, however he declines this and wants a diagnostic LHC at [facility] today with possible transfer to [facility] afterwards -ASA -Plavix -Lipitor/Zetia -Risks and benefits of cardiac catheterization have been discussed with the patient including risks of bleeding, bruising, infection, kidney damage, stroke, heart attack, urgent need for bypass, injury to a limb, and death. The patient understands these risks and is willing to proceed with the procedure. All questions have been answered and concerns listened to  2. Chronic combined systolic and diastolic CHF/ICM: -NYHA class III -With hypotension, will hold Entresto, Toprol XL, spironolactone, Imdur, and torsemide for systolic BP < 409 mmHg -Will need evaluation by the advanced heart failure service  -Daily weights -Strict I/O  3. New onset  Afib: -Onset is uncertain -He remains in Afib with controlled ventricular response -CHADS2VASc 4 (CHF, HTN, DM, vascular disease) -Heparin gtt -Will need DOAC at time of hospital discharge  -Metoprolol as BP allows for rate control -Will need to pursue rhythm control strategy pending the above workup down the road   4. Hypotension: -Cannot exclude low output given his CM with EF ~ 14% by preliminary echo this admission vs sepsis with lower extremity ulceration -BP trending up this morning -May need inotropic/pressor support, deferred at this time as discussed with MD  5. PAD: -Status post prior stenting and R-BKA -Followed by vascular surgery as an outpatient   6. DM2: -A1c 5.8 -Metformin held -SSI  7. HTN: -BP soft with medications held as above  8. HLD: -LDL 77 with goal less than 70 -Lipitor/Zetia -Consider PCSK-9 inhibitor as an outpatient   9. Tobacco use: -Cessation advised   10. Anemia of chronic disease: -Stable  For questions or updates, please contact [place] Please consult [email or web address] for contact info under Cardiology/STEMI.    Signed, Christell Faith, PA-C [place] Pager: [phone] 01/20/2020, 8:41 AM

## 2020-01-20 NOTE — Progress Notes (Signed)
PROGRESS NOTE    [name] [name]  OVZ:[id number] DOB: 1960/07/29 DOA: 01/19/2020 PCP: [name], MD   Assessment & Plan:   Principal Problem:   Chest pain Active Problems:   HLD (hyperlipidemia)   Hypertension   Tobacco use   Chronic systolic CHF (congestive heart failure) (HCC)   Type 2 diabetes mellitus with other specified complication (HCC)   NSTEMI (non-ST elevated myocardial infarction) (HCC)   COPD (chronic obstructive pulmonary disease) (HCC)   CAD (coronary artery disease)   Cellulitis of left lower leg   Atrial fibrillation ([place])   NSTEMI: w/ elevated troponins. Hx of CAD. Continue on IV heparin drip. Will go to cardiac cath today. Echo pending. Continue on tele. Continue on plavix, statin, imdur. Nitro, morphine prn   Atrial fibrillation-new onset: CHA2DS2-VASc Score is 4, so will continue on IV heparin for now. Continue on metoprolol. Continue on tele  HLD: continue on zetia, statin   Hypertension: continue on home dose of metoprolol, spironolactone, entresto, torsemide. IV hydralazine prn   Tobacco use: smoking cessation counseling. Nicotine patch to prevent w/drawal   Chronic systolic CHF: echo on 09/19/8784 showed EF 20-25%. Continue torsemide, spironolactone, entresto    DM2: recent A1c 6.6.  Well controlled.  Hold home dose of metformin. Continue on SSI w/ accuchecks  COPD: stable, w/o exacerbation. Continue on bronchodilators   Cellulitis of left lower leg: continue on keflex. Wound care consulted     DVT prophylaxis: IV heparin  Code Status: full  Family Communication:  Disposition Plan: likely d/c back home   Consultants:   Cardio    Procedures:   Antimicrobials:   Subjective: Pt c/o intermittent chest pain   Objective: Vitals:   01/19/20 2052 01/20/20 0241 01/20/20 0527 01/20/20 0544  BP: 110/89  (!) 64/49 (!) 88/59  Pulse: 97  66 65  Resp:    18  Temp: 98.1 F (36.7 C)   98.4 F (36.9 C)  TempSrc: Oral     SpO2:  98% 98% 99% 99%  Weight:    90.8 kg  Height:        Intake/Output Summary (Last 24 hours) at 01/20/2020 0814 Last data filed at 01/20/2020 0554 Gross per 24 hour  Intake 108.68 ml  Output 500 ml  Net -391.32 ml   Filed Weights   01/19/20 0939 01/19/20 1826 01/20/20 0544  Weight: 89.5 kg 87.2 kg 90.8 kg    Examination:  General exam: Appears calm and comfortable  Respiratory system: Clear to auscultation. Respiratory effort normal. No rales Cardiovascular system: S1 & S2 +. No rubs, gallops or clicks. . Gastrointestinal system: Abdomen is obese, soft and nontender. Normal bowel sounds heard. Central nervous system: Alert and oriented. Moves all 4 extremities  Psychiatry: Judgement and insight appear normal. Mood & affect appropriate.     Data Reviewed: I have personally reviewed following labs and imaging studies  CBC: Recent Labs  Lab 01/19/20 0946 01/20/20 0352  WBC 6.3 4.7  HGB 12.1* 10.6*  HCT 36.5* 32.0*  MCV 84.9 86.0  PLT 177 767   Basic Metabolic Panel: Recent Labs  Lab 01/19/20 0946 01/20/20 0352  NA 132* 132*  K 3.5 3.5  CL 89* 90*  CO2 32 33*  GLUCOSE 147* 120*  BUN 17 21*  CREATININE 1.25* 1.23  CALCIUM 8.6* 8.4*   GFR: Estimated Creatinine Clearance: 71.2 mL/min (by C-G formula based on SCr of 1.23 mg/dL). Liver Function Tests: No results for input(s): AST, ALT, ALKPHOS, BILITOT, PROT, ALBUMIN  in the last 168 hours. No results for input(s): LIPASE, AMYLASE in the last 168 hours. No results for input(s): AMMONIA in the last 168 hours. Coagulation Profile: Recent Labs  Lab 01/19/20 0946  INR 1.2   Cardiac Enzymes: No results for input(s): CKTOTAL, CKMB, CKMBINDEX, TROPONINI in the last 168 hours. BNP (last 3 results) No results for input(s): PROBNP in the last 8760 hours. HbA1C: Recent Labs    01/19/20 1315  HGBA1C 5.8*   CBG: Recent Labs  Lab 01/18/20 1344 01/19/20 1454 01/19/20 1700 01/19/20 1826 01/19/20 2137  GLUCAP 170*  106* 97 184* 201*   Lipid Profile: Recent Labs    01/20/20 0352  CHOL 118  HDL 34*  LDLCALC 77  TRIG 37  CHOLHDL 3.5   Thyroid Function Tests: No results for input(s): TSH, T4TOTAL, FREET4, T3FREE, THYROIDAB in the last 72 hours. Anemia Panel: No results for input(s): VITAMINB12, FOLATE, FERRITIN, TIBC, IRON, RETICCTPCT in the last 72 hours. Sepsis Labs: No results for input(s): PROCALCITON, LATICACIDVEN in the last 168 hours.  Recent Results (from the past 240 hour(s))  SARS Coronavirus 2 by RT PCR (hospital order, performed in [facility] hospital lab) Nasopharyngeal Nasopharyngeal Swab     Status: None   Collection Time: 01/19/20 11:05 AM   Specimen: Nasopharyngeal Swab  Result Value Ref Range Status   SARS Coronavirus 2 NEGATIVE NEGATIVE Final    Comment: (NOTE) SARS-CoV-2 target nucleic acids are NOT DETECTED.  The SARS-CoV-2 RNA is generally detectable in upper and lower respiratory specimens during the acute phase of infection. The lowest concentration of SARS-CoV-2 viral copies this assay can detect is 250 copies / mL. A negative result does not preclude SARS-CoV-2 infection and should not be used as the sole basis for treatment or other patient management decisions.  A negative result may occur with improper specimen collection / handling, submission of specimen other than nasopharyngeal swab, presence of viral mutation(s) within the areas targeted by this assay, and inadequate number of viral copies (<250 copies / mL). A negative result must be combined with clinical observations, patient history, and epidemiological information.  Fact Sheet for Patients:   StrictlyIdeas.no  Fact Sheet for Healthcare Providers: BankingDealers.co.za  This test is not yet approved or  cleared by the [place] FDA and has been authorized for detection and/or diagnosis of SARS-CoV-2 by FDA under an Emergency Use Authorization  (EUA).  This EUA will remain in effect (meaning this test can be used) for the duration of the COVID-19 declaration under Section 564(b)(1) of the Act, 21 U.S.C. section 360bbb-3(b)(1), unless the authorization is terminated or revoked sooner.  Performed at [facility], [address] [place], [place] [address]   Culture, blood (routine x 2)     Status: None (Preliminary result)   Collection Time: 01/19/20  6:29 PM   Specimen: BLOOD  Result Value Ref Range Status   Specimen Description BLOOD LEFT ANTECUBITAL  Final   Special Requests   Final    BOTTLES DRAWN AEROBIC AND ANAEROBIC Blood Culture adequate volume   Culture   Final    NO GROWTH < 12 HOURS Performed at [facility], [address] [place], [place] [address]    Report Status PENDING  Incomplete  Culture, blood (routine x 2)     Status: None (Preliminary result)   Collection Time: 01/19/20  6:33 PM   Specimen: BLOOD  Result Value Ref Range Status   Specimen Description BLOOD BLOOD LEFT HAND  Final  Special Requests   Final    BOTTLES DRAWN AEROBIC AND ANAEROBIC Blood Culture adequate volume   Culture   Final    NO GROWTH < 12 HOURS Performed at [facility], [address], [place], [place] [address]    Report Status PENDING  Incomplete         Radiology Studies: DG Chest 2 View  Result Date: 01/19/2020 CLINICAL DATA:  Chest pain EXAM: CHEST - 2 VIEW COMPARISON:  September 25, 2009 FINDINGS: The cardiomediastinal silhouette is unchanged in contour.Atherosclerotic calcifications of the aorta. No pleural effusion. No pneumothorax. No acute pleuroparenchymal abnormality. Visualized abdomen is unremarkable. Status post RIGHT shoulder surgery. Multilevel degenerative changes of the thoracic spine. IMPRESSION: No acute cardiopulmonary abnormality. Electronically Signed   By: Valentino Saxon MD   On: 01/19/2020 10:14        Scheduled Meds: . aspirin EC  81 mg Oral Daily  .  atorvastatin  80 mg Oral Daily  . cephALEXin  500 mg Oral Q6H  . clopidogrel  75 mg Oral Daily  . DULoxetine  20 mg Oral Daily  . ezetimibe  10 mg Oral Daily  . gabapentin  800 mg Oral QID  . insulin aspart  0-5 Units Subcutaneous QHS  . insulin aspart  0-9 Units Subcutaneous TID WC  . ipratropium-albuterol  3 mL Inhalation Q6H  . isosorbide mononitrate  30 mg Oral Daily  . metoprolol succinate  50 mg Oral Daily  . mometasone-formoterol  2 puff Inhalation BID  . multivitamin with minerals  1 tablet Oral Daily  . nicotine  21 mg Transdermal Daily  . pantoprazole  40 mg Oral Daily  . sacubitril-valsartan  1 tablet Oral BID  . spironolactone  25 mg Oral Daily  . torsemide  60 mg Oral Daily  . traZODone  150 mg Oral QHS   Continuous Infusions: . heparin 1,500 Units/hr (01/19/20 2247)     LOS: 0 days    Time spent: 33 mins    Wyvonnia Dusky, MD [facility] Pager [phone]  If 7PM-7AM, please contact night-coverage [email or web address] 01/20/2020, 8:14 AM

## 2020-01-20 NOTE — Progress Notes (Signed)
*  PRELIMINARY RESULTS* Echocardiogram 2D Echocardiogram has been performed.  [name] [name] [name] 01/20/2020, 8:38 AM

## 2020-01-21 ENCOUNTER — Encounter: Payer: Self-pay | Admitting: Cardiovascular Disease

## 2020-01-21 NOTE — Discharge Summary (Signed)
Physician Discharge Summary  [name] [name] VHQ:[id number] DOB: [birth year] DOA: 01/19/2020  PCP: [name], MD  Admit date: 01/19/2020 Discharge date: 01/21/2020  Admitted From: home Disposition: Pt left AMA  Recommendations for Outpatient Follow-up:  1. Pt left AMA   Home Health: no  Equipment/Devices:  Discharge Condition: guarded CODE STATUS: full  Diet recommendation: Heart Healthy / Carb Modified   Brief/Interim Summary: HPI was taken from Dr. Blaine Hamper: [name] [name] is a 59 y.o. male with medical history significant of subclavian artery stenosis, RLS, PAD, obesity, CAD, sCHF with EF 20-25%, BPH, right BKA, who presents with chest pain.  Patient states that his chest pain started at about 8 AM today.  It is located in substernal area and also on the left side of the chest, constant, initially 10 out of 10 severity, currently chest pain is minimal, pressure-like, radiating to the left arm, left hand and the neck.  Patient has cough with clear mucus production.  He also has mild shortness breath, no fever or chills.  Denies nausea, vomiting, diarrhea, abdominal pain, symptoms of UTI or unilateral weakness. Pt also has left lower leg cellulitis.    ED Course: pt was found to have WBC 6.3, INR 1.2, PTT 32, troponin 110 -->9128, BNP 1511, pending COVID-19 PCR, creatinine 1.25, BUN 17, GFR >60, temperature normal, soft blood pressure, heart rate 50, RR 16, oxygen saturation 93 to 95% on room air, chest x-ray negative. Pt was found to have new onset A fib.  Patient is placed on progressive benefit observation. Dr. Garen Lah is consulted.  Hospital course from Dr. Lenise Herald 01/20/20: Pt presented w/ chest pain and was found to have NSTEMI. Pt had a cardiac cath and shortly after the cardiac cath pt became agitated and decided he was going home. Pt left AMA. See other progress notes for more information.   Discharge Diagnoses:  Principal Problem:   Chest pain Active Problems:   HLD  (hyperlipidemia)   Hypertension   Tobacco use   Chronic systolic CHF (congestive heart failure) (HCC)   Type 2 diabetes mellitus with other specified complication (HCC)   NSTEMI (non-ST elevated myocardial infarction) (HCC)   COPD (chronic obstructive pulmonary disease) (HCC)   CAD (coronary artery disease)   Cellulitis of left lower leg   Atrial fibrillation (HCC) NSTEMI: w/ elevated troponins. Hx of CAD. Continue on IV heparin drip. Will go to cardiac cath today. Echo pending. Continue on tele. Continue on plavix, statin, imdur. Nitro, morphine prn   Atrial fibrillation-new onset:CHA2DS2-VASc Scoreis 4, so will continue on IV heparin for now. Continue on metoprolol. Continue on tele  HLD: continue on zetia, statin   Hypertension: continue on home dose of metoprolol, spironolactone, entresto, torsemide. IV hydralazine prn   Tobacco use: smoking cessation counseling. Nicotine patch to prevent w/drawal   Chronic systolic CHF: echo on 09/21/2438 showed EF 20-25%. Continue torsemide, spironolactone, entresto    DM2: recent A1c 6.6. Well controlled. Hold home dose of metformin. Continue on SSI w/ accuchecks  COPD: stable, w/o exacerbation. Continue on bronchodilators   Cellulitis of left lower NUU:[id number] on keflex. Wound care consulted    Discharge Instructions: Pt left AMA    Allergies as of 01/20/2020      Reactions   Dulaglutide Anaphylaxis, Diarrhea, Hives   Other Itching   Skin itching associated with nitro patch   Prednisone Rash      Medication List    ASK your doctor about these medications   albuterol 108 (  90 Base) MCG/ACT inhaler Commonly known as: VENTOLIN HFA Inhale into the lungs every 6 (six) hours as needed for wheezing or shortness of breath.   aspirin EC 81 MG tablet Take 1 tablet (81 mg total) by mouth daily.   atorvastatin 80 MG tablet Commonly known as: LIPITOR Take 80 mg by mouth daily.   clopidogrel 75 MG tablet Commonly known as:  PLAVIX Take 75 mg by mouth daily.   collagenase ointment Commonly known as: SANTYL Apply 1 application topically daily.   DULoxetine 20 MG capsule Commonly known as: CYMBALTA Take 20 mg by mouth daily.   Entresto 24-26 MG Generic drug: sacubitril-valsartan Take 1 tablet by mouth 2 (two) times daily.   ezetimibe 10 MG tablet Commonly known as: ZETIA TAKE 1 TABLET BY MOUTH ONCE DAILY   gabapentin 800 MG tablet Commonly known as: NEURONTIN Take 800 mg by mouth 4 (four) times daily.   ipratropium-albuterol 0.5-2.5 (3) MG/3ML Soln Commonly known as: DUONEB Inhale 3 mLs into the lungs every 6 (six) hours as needed (wheezing/sob).   isosorbide mononitrate 30 MG 24 hr tablet Commonly known as: IMDUR TAKE 1 TABLET BY MOUTH ONCE DAILY   metFORMIN 500 MG 24 hr tablet Commonly known as: GLUCOPHAGE-XR Take 1,000 mg by mouth 2 (two) times daily.   metoprolol succinate 25 MG 24 hr tablet Commonly known as: TOPROL-XL Take 1 tablet (25 mg total) by mouth daily.   Multi-Vitamins Tabs Take 1 tablet by mouth daily.   nitroGLYCERIN 0.4 MG SL tablet Commonly known as: NITROSTAT Place 1 tablet (0.4 mg total) under the tongue every 5 (five) minutes as needed for chest pain.   nystatin 100000 UNIT/ML suspension Commonly known as: MYCOSTATIN Take 5 mLs by mouth 4 (four) times daily.   omeprazole 40 MG capsule Commonly known as: PRILOSEC Take 40 mg by mouth daily.   Oxycodone HCl 10 MG Tabs Take 10 mg by mouth every 6 (six) hours as needed (pain).   potassium chloride 10 MEQ tablet Commonly known as: KLOR-CON Take 1 tablet (10 mEq total) by mouth as directed. Take 1 tablet twice a day on days with metolazone take 4 tablets (40 mEq)   promethazine 25 MG tablet Commonly known as: PHENERGAN Take 25 mg by mouth every 6 (six) hours as needed.   spironolactone 25 MG tablet Commonly known as: ALDACTONE Take 1 tablet (25 mg total) by mouth daily.   Symbicort 80-4.5 MCG/ACT  inhaler Generic drug: budesonide-formoterol Inhale 2 puffs into the lungs daily.   torsemide 20 MG tablet Commonly known as: DEMADEX Take 3 tablets (60 mg total) by mouth daily.   traZODone 150 MG tablet Commonly known as: DESYREL Take 150 mg by mouth at bedtime.       Allergies  Allergen Reactions  . Dulaglutide Anaphylaxis, Diarrhea and Hives  . Other Itching    Skin itching associated with nitro patch  . Prednisone Rash    Consultations:  Cardio    Procedures/Studies: DG Chest 2 View  Result Date: 01/19/2020 CLINICAL DATA:  Chest pain EXAM: CHEST - 2 VIEW COMPARISON:  September 25, 2009 FINDINGS: The cardiomediastinal silhouette is unchanged in contour.Atherosclerotic calcifications of the aorta. No pleural effusion. No pneumothorax. No acute pleuroparenchymal abnormality. Visualized abdomen is unremarkable. Status post RIGHT shoulder surgery. Multilevel degenerative changes of the thoracic spine. IMPRESSION: No acute cardiopulmonary abnormality. Electronically Signed   By: Valentino Saxon MD   On: 01/19/2020 10:14   CARDIAC CATHETERIZATION  Result Date: 01/20/2020  Prox RCA-1 lesion  is 80% stenosed.  Prox RCA-2 lesion is 20% stenosed.  Prox RCA to Mid RCA lesion is 80% stenosed.  Prox LAD lesion is 20% stenosed.  1st Diag lesion is 80% stenosed.  Mid LAD lesion is 20% stenosed.  Dist LAD lesion is 60% stenosed.  Prox Cx to Mid Cx lesion is 90% stenosed.  LPAV lesion is 95% stenosed.  1. Significant underlying three-vessel coronary artery disease. Patent proximal LAD stent with mild in-stent restenosis. Moderate diffuse disease in the distal LAD. Significant stenosis in the proximal left circumflex at the origin of the posterior AV groove artery which is also heavily diseased at the ostium. This is a bifurcation lesion and heavily calcified. RCA stent is patent. However, there is significant proximal disease in the whole mid to distal segment is diffusely diseased and  small caliber. 2. Left ventricular angiography was not performed. EF was severe reduced by echo. 3. Moderately elevated left ventricular end-diastolic pressure at 28 mmHg Recommendations: The patient's cardiomyopathy seems to be out of proportion to his coronary artery disease as the LAD itself does not seem to have obstructive disease at the present time. The RCA is diffusely diseased and too small to stent. The only target for revascularization would be the left circumflex but it is a calcified bifurcation lesion and likely requires atherectomy. In order to do this safely, the patient has to be optimized from a heart failure standpoint as he appears to be volume overloaded. Recommend intravenous diuresis. If limited by hypotension, we might need to consider inotropic support and small dose norepinephrine drip initially. I added small dose digoxin. Treat underlying atrial fibrillation. Resume heparin drip 8 hours after sheath pull.   ECHOCARDIOGRAM COMPLETE  Result Date: 01/20/2020    ECHOCARDIOGRAM REPORT   Patient Name:   EMIDIO WARRELL Date of Exam: 01/20/2020 Medical Rec #:  [id number]      Height:       67.0 in Accession #:    [id number]     Weight:       200.2 lb Date of Birth:  1960-11-09       BSA:          2.023 m Patient Age:    59 years       BP:           88/59 mmHg Patient Gender: M              HR:           61 bpm. Exam Location:  [facility] Procedure: 2D Echo, Color Doppler, Cardiac Doppler and Intracardiac            Opacification Agent Indications:     I21.4 NSTEMI  History:         Patient has prior history of Echocardiogram examinations, most                  recent 09/28/2019. CAD, COPD and PAD, Arrythmias:RBBB and Atrial                  Fibrillation; Risk Factors:Current Smoker, Sleep Apnea,                  Hypertension, Diabetes and Dyslipidemia.  Sonographer:     Charmayne Sheer RDCS (AE) Referring Phys:  [id number] Soledad Gerlach NIU Diagnosing Phys: Ida Rogue MD  Sonographer Comments: Suboptimal apical  window. IMPRESSIONS  1. Left ventricular ejection fraction, by estimation, is <20%. The left ventricle has severely decreased function. The left ventricle  demonstrates global hypokinesis. The left ventricular internal cavity size was moderately dilated. Left ventricular diastolic parameters are indeterminate.  2. Right ventricular systolic function is moderately reduced. The right ventricular size is moderately enlarged. There is normal pulmonary artery systolic pressure. The estimated right ventricular systolic pressure is 62.3 mmHg.  3. Left atrial size was mildly dilated.  4. Mild mitral valve regurgitation.  5. Tricuspid valve regurgitation is moderate. FINDINGS  Left Ventricle: Left ventricular ejection fraction, by estimation, is <20%. The left ventricle has severely decreased function. The left ventricle demonstrates global hypokinesis. Definity contrast agent was given IV to delineate the left ventricular endocardial borders. The left ventricular internal cavity size was moderately dilated. There is no left ventricular hypertrophy. Left ventricular diastolic parameters are indeterminate. Right Ventricle: The right ventricular size is moderately enlarged. No increase in right ventricular wall thickness. Right ventricular systolic function is moderately reduced. There is normal pulmonary artery systolic pressure. The tricuspid regurgitant velocity is 2.17 m/s, and with an assumed right atrial pressure of 10 mmHg, the estimated right ventricular systolic pressure is 76.2 mmHg. Left Atrium: Left atrial size was mildly dilated. Right Atrium: Right atrial size was normal in size. Pericardium: There is no evidence of pericardial effusion. Mitral Valve: The mitral valve is normal in structure. Normal mobility of the mitral valve leaflets. Mild mitral valve regurgitation. No evidence of mitral valve stenosis. MV peak gradient, 4.2 mmHg. The mean mitral valve gradient is 2.0 mmHg. Tricuspid Valve: The tricuspid valve  is normal in structure. Tricuspid valve regurgitation is moderate . No evidence of tricuspid stenosis. Aortic Valve: The aortic valve was not well visualized. Aortic valve regurgitation is not visualized. Mild to moderate aortic valve sclerosis/calcification is present, without any evidence of aortic stenosis. Aortic valve mean gradient measures 4.0 mmHg.  Aortic valve peak gradient measures 6.7 mmHg. Aortic valve area, by VTI measures 2.06 cm. Pulmonic Valve: The pulmonic valve was normal in structure. Pulmonic valve regurgitation is not visualized. No evidence of pulmonic stenosis. Aorta: The aortic root is normal in size and structure. Venous: The inferior vena cava is normal in size with greater than 50% respiratory variability, suggesting right atrial pressure of 3 mmHg. IAS/Shunts: No atrial level shunt detected by color flow Doppler.  LEFT VENTRICLE PLAX 2D LVIDd:         6.38 cm      Diastology LVIDs:         6.04 cm      LV e' lateral:   5.33 cm/s LV PW:         1.16 cm      LV E/e' lateral: 17.4 LV IVS:        1.03 cm      LV e' medial:    3.05 cm/s LVOT diam:     2.60 cm      LV E/e' medial:  30.3 LV SV:         48 LV SV Index:   24 LVOT Area:     5.31 cm  LV Volumes (MOD) LV vol d, MOD A2C: 240.0 ml LV vol d, MOD A4C: 251.0 ml LV vol s, MOD A2C: 224.0 ml LV vol s, MOD A4C: 214.0 ml LV SV MOD A2C:     16.0 ml LV SV MOD A4C:     251.0 ml LV SV MOD BP:      22.5 ml RIGHT VENTRICLE RV Basal diam:  4.32 cm LEFT ATRIUM  Index       RIGHT ATRIUM           Index LA diam:        4.70 cm 2.32 cm/m  RA Area:     20.50 cm LA Vol (A2C):   61.4 ml 30.35 ml/m RA Volume:   70.50 ml  34.85 ml/m LA Vol (A4C):   74.5 ml 36.82 ml/m LA Biplane Vol: 69.9 ml 34.55 ml/m  AORTIC VALVE                    PULMONIC VALVE AV Area (Vmax):    2.18 cm     PV Vmax:       0.75 m/s AV Area (Vmean):   1.87 cm     PV Vmean:      54.800 cm/s AV Area (VTI):     2.06 cm     PV VTI:        0.120 m AV Vmax:           129.00  cm/s  PV Peak grad:  2.2 mmHg AV Vmean:          101.000 cm/s PV Mean grad:  1.0 mmHg AV VTI:            0.233 m AV Peak Grad:      6.7 mmHg AV Mean Grad:      4.0 mmHg LVOT Vmax:         52.90 cm/s LVOT Vmean:        35.500 cm/s LVOT VTI:          0.090 m LVOT/AV VTI ratio: 0.39  AORTA Ao Root diam: 3.00 cm MITRAL VALVE               TRICUSPID VALVE MV Area (PHT): 5.06 cm    TR Peak grad:   18.8 mmHg MV Peak grad:  4.2 mmHg    TR Vmax:        217.00 cm/s MV Mean grad:  2.0 mmHg MV Vmax:       1.03 m/s    SHUNTS MV Vmean:      59.4 cm/s   Systemic VTI:  0.09 m MV Decel Time: 150 msec    Systemic Diam: 2.60 cm MV E velocity: 92.50 cm/s Ida Rogue MD Electronically signed by Ida Rogue MD Signature Date/Time: 01/20/2020/6:20:54 PM    Final       Subjective: Pt c/o intermittent chest pain    Discharge Exam: Vitals:   01/20/20 1800 01/20/20 1827  BP: (!) 83/62 (!) 81/66  Pulse: 65 69  Resp: 18   Temp:    SpO2: 91%    Vitals:   01/20/20 1750 01/20/20 1755 01/20/20 1800 01/20/20 1827  BP:   (!) 83/62 (!) 81/66  Pulse: (!) 51 62 65 69  Resp: 18 11 18    Temp:      TempSrc:      SpO2: 90% 94% 91%   Weight:      Height:        General exam: Appears calm and comfortable  Respiratory system: Clear to auscultation. Respiratory effort normal. No rales Cardiovascular system: S1 & S2 +. No rubs, gallops or clicks. . Gastrointestinal system: Abdomen is obese, soft and nontender. Normal bowel sounds heard. Central nervous system: Alert and oriented. Moves all 4 extremities  Psychiatry: Judgement and insight appear normal. Mood & affect appropriate.     The results of significant diagnostics from this hospitalization (including  imaging, microbiology, ancillary and laboratory) are listed below for reference.     Microbiology: Recent Results (from the past 240 hour(s))  SARS Coronavirus 2 by RT PCR (hospital order, performed in [facility] hospital lab) Nasopharyngeal Nasopharyngeal  Swab     Status: None   Collection Time: 01/19/20 11:05 AM   Specimen: Nasopharyngeal Swab  Result Value Ref Range Status   SARS Coronavirus 2 NEGATIVE NEGATIVE Final    Comment: (NOTE) SARS-CoV-2 target nucleic acids are NOT DETECTED.  The SARS-CoV-2 RNA is generally detectable in upper and lower respiratory specimens during the acute phase of infection. The lowest concentration of SARS-CoV-2 viral copies this assay can detect is 250 copies / mL. A negative result does not preclude SARS-CoV-2 infection and should not be used as the sole basis for treatment or other patient management decisions.  A negative result may occur with improper specimen collection / handling, submission of specimen other than nasopharyngeal swab, presence of viral mutation(s) within the areas targeted by this assay, and inadequate number of viral copies (<250 copies / mL). A negative result must be combined with clinical observations, patient history, and epidemiological information.  Fact Sheet for Patients:   StrictlyIdeas.no  Fact Sheet for Healthcare Providers: BankingDealers.co.za  This test is not yet approved or  cleared by the [place] FDA and has been authorized for detection and/or diagnosis of SARS-CoV-2 by FDA under an Emergency Use Authorization (EUA).  This EUA will remain in effect (meaning this test can be used) for the duration of the COVID-19 declaration under Section 564(b)(1) of the Act, 21 U.S.C. section 360bbb-3(b)(1), unless the authorization is terminated or revoked sooner.  Performed at [facility], [address] [place], [place] [address]   Culture, blood (routine x 2)     Status: None (Preliminary result)   Collection Time: 01/19/20  6:29 PM   Specimen: BLOOD  Result Value Ref Range Status   Specimen Description BLOOD LEFT ANTECUBITAL  Final   Special Requests   Final    BOTTLES DRAWN AEROBIC AND ANAEROBIC  Blood Culture adequate volume   Culture   Final    NO GROWTH < 12 HOURS Performed at [facility], [address], [place], [place] [address]    Report Status PENDING  Incomplete  Culture, blood (routine x 2)     Status: None (Preliminary result)   Collection Time: 01/19/20  6:33 PM   Specimen: BLOOD  Result Value Ref Range Status   Specimen Description BLOOD BLOOD LEFT HAND  Final   Special Requests   Final    BOTTLES DRAWN AEROBIC AND ANAEROBIC Blood Culture adequate volume   Culture   Final    NO GROWTH < 12 HOURS Performed at [facility], [address], [place], [place] [address]    Report Status PENDING  Incomplete     Labs: BNP (last 3 results) Recent Labs    09/22/19 1708 09/26/19 2224 01/19/20 0946  BNP 2,365.0* 1,342.0* 3,893.7*   Basic Metabolic Panel: Recent Labs  Lab 01/19/20 0946 01/20/20 0352  NA 132* 132*  K 3.5 3.5  CL 89* 90*  CO2 32 33*  GLUCOSE 147* 120*  BUN 17 21*  CREATININE 1.25* 1.23  CALCIUM 8.6* 8.4*   Liver Function Tests: No results for input(s): AST, ALT, ALKPHOS, BILITOT, PROT, ALBUMIN in the last 168 hours. No results for input(s): LIPASE, AMYLASE in the last 168 hours. No results for input(s): AMMONIA in the last 168 hours. CBC:  Recent Labs  Lab 01/19/20 0946 01/20/20 0352  WBC 6.3 4.7  HGB 12.1* 10.6*  HCT 36.5* 32.0*  MCV 84.9 86.0  PLT 177 153   Cardiac Enzymes: No results for input(s): CKTOTAL, CKMB, CKMBINDEX, TROPONINI in the last 168 hours. BNP: Invalid input(s): POCBNP CBG: Recent Labs  Lab 01/19/20 1826 01/19/20 2137 01/20/20 0845 01/20/20 1313 01/20/20 1704  GLUCAP 184* 201* 118* 95 151*   D-Dimer No results for input(s): DDIMER in the last 72 hours. Hgb A1c Recent Labs    01/19/20 1315  HGBA1C 5.8*   Lipid Profile Recent Labs    01/20/20 0352  CHOL 118  HDL 34*  LDLCALC 77  TRIG 37  CHOLHDL 3.5   Thyroid function studies No results for input(s): TSH, T4TOTAL,  T3FREE, THYROIDAB in the last 72 hours.  Invalid input(s): FREET3 Anemia work up No results for input(s): VITAMINB12, FOLATE, FERRITIN, TIBC, IRON, RETICCTPCT in the last 72 hours. Urinalysis    Component Value Date/Time   COLORURINE STRAW (A) 09/10/2019 2028   APPEARANCEUR CLEAR (A) 09/10/2019 2028   APPEARANCEUR Clear 02/06/2014 1412   LABSPEC 1.003 (L) 09/10/2019 2028   LABSPEC 1.015 02/06/2014 1412   PHURINE 6.0 09/10/2019 2028   GLUCOSEU NEGATIVE 09/10/2019 2028   GLUCOSEU Negative 02/06/2014 1412   HGBUR NEGATIVE 09/10/2019 2028   BILIRUBINUR NEGATIVE 09/10/2019 2028   BILIRUBINUR Negative 02/06/2014 1412   KETONESUR NEGATIVE 09/10/2019 2028   PROTEINUR NEGATIVE 09/10/2019 2028   NITRITE NEGATIVE 09/10/2019 2028   LEUKOCYTESUR NEGATIVE 09/10/2019 2028   LEUKOCYTESUR Negative 02/06/2014 1412   Sepsis Labs Invalid input(s): PROCALCITONIN,  WBC,  LACTICIDVEN Microbiology Recent Results (from the past 240 hour(s))  SARS Coronavirus 2 by RT PCR (hospital order, performed in [facility] hospital lab) Nasopharyngeal Nasopharyngeal Swab     Status: None   Collection Time: 01/19/20 11:05 AM   Specimen: Nasopharyngeal Swab  Result Value Ref Range Status   SARS Coronavirus 2 NEGATIVE NEGATIVE Final    Comment: (NOTE) SARS-CoV-2 target nucleic acids are NOT DETECTED.  The SARS-CoV-2 RNA is generally detectable in upper and lower respiratory specimens during the acute phase of infection. The lowest concentration of SARS-CoV-2 viral copies this assay can detect is 250 copies / mL. A negative result does not preclude SARS-CoV-2 infection and should not be used as the sole basis for treatment or other patient management decisions.  A negative result may occur with improper specimen collection / handling, submission of specimen other than nasopharyngeal swab, presence of viral mutation(s) within the areas targeted by this assay, and inadequate number of viral copies (<250 copies /  mL). A negative result must be combined with clinical observations, patient history, and epidemiological information.  Fact Sheet for Patients:   StrictlyIdeas.no  Fact Sheet for Healthcare Providers: BankingDealers.co.za  This test is not yet approved or  cleared by the [place] FDA and has been authorized for detection and/or diagnosis of SARS-CoV-2 by FDA under an Emergency Use Authorization (EUA).  This EUA will remain in effect (meaning this test can be used) for the duration of the COVID-19 declaration under Section 564(b)(1) of the Act, 21 U.S.C. section 360bbb-3(b)(1), unless the authorization is terminated or revoked sooner.  Performed at [facility], [address] [place], [place] [address]   Culture, blood (routine x 2)     Status: None (Preliminary result)   Collection Time: 01/19/20  6:29 PM   Specimen: BLOOD  Result Value Ref Range Status   Specimen Description  BLOOD LEFT ANTECUBITAL  Final   Special Requests   Final    BOTTLES DRAWN AEROBIC AND ANAEROBIC Blood Culture adequate volume   Culture   Final    NO GROWTH < 12 HOURS Performed at [facility], [address] [place], [place] [address]    Report Status PENDING  Incomplete  Culture, blood (routine x 2)     Status: None (Preliminary result)   Collection Time: 01/19/20  6:33 PM   Specimen: BLOOD  Result Value Ref Range Status   Specimen Description BLOOD BLOOD LEFT HAND  Final   Special Requests   Final    BOTTLES DRAWN AEROBIC AND ANAEROBIC Blood Culture adequate volume   Culture   Final    NO GROWTH < 12 HOURS Performed at [facility], [address] [place], [place] [address]    Report Status PENDING  Incomplete     Time coordinating discharge: Over 30 minutes  SIGNED:   Wyvonnia Dusky, MD  [facility] 01/21/2020, 7:16 AM Pager   If 7PM-7AM, please contact night-coverage [email or web address]

## 2020-01-23 ENCOUNTER — Encounter (INDEPENDENT_AMBULATORY_CARE_PROVIDER_SITE_OTHER): Payer: Self-pay | Admitting: Vascular Surgery

## 2020-01-23 NOTE — Progress Notes (Signed)
MRN : [id number]  [name] [name] is a 59 y.o. (Jan 17, 1961) male who presents with chief complaint of  Chief Complaint  Patient presents with  . Follow-up    6wk follow up  .  History of Present Illness:   The patient returns to the office for followup evaluation regarding leg swelling.  The swelling has improved quite a bit and the pain associated with swelling has decreased substantially. There have not been any interval development of a ulcerations or wounds.  Since the previous visit the patient has been wearing graduated compression stockings and has noted little significant improvement in the lymphedema. The patient has been using compression routinely morning until night.  The patient also states elevation during the day and exercise is being done too.     Current Meds  Medication Sig  . albuterol (VENTOLIN HFA) 108 (90 Base) MCG/ACT inhaler Inhale into the lungs every 6 (six) hours as needed for wheezing or shortness of breath.  [name] aspirin EC 81 MG tablet Take 1 tablet (81 mg total) by mouth daily.  [name] atorvastatin (LIPITOR) 80 MG tablet Take 80 mg by mouth daily.  . clopidogrel (PLAVIX) 75 MG tablet Take 75 mg by mouth daily.   . collagenase (SANTYL) ointment Apply 1 application topically daily.  . DULoxetine (CYMBALTA) 20 MG capsule Take 20 mg by mouth daily.  [name] ezetimibe (ZETIA) 10 MG tablet TAKE 1 TABLET BY MOUTH ONCE DAILY (Patient taking differently: Take 10 mg by mouth daily. )  . ipratropium-albuterol (DUONEB) 0.5-2.5 (3) MG/3ML SOLN Inhale 3 mLs into the lungs every 6 (six) hours as needed (wheezing/sob).   . isosorbide mononitrate (IMDUR) 30 MG 24 hr tablet TAKE 1 TABLET BY MOUTH ONCE DAILY (Patient taking differently: Take 30 mg by mouth daily. )  . metFORMIN (GLUCOPHAGE-XR) 500 MG 24 hr tablet Take 1,000 mg by mouth 2 (two) times daily.   . metoprolol succinate (TOPROL-XL) 25 MG 24 hr tablet Take 1 tablet (25 mg total) by mouth daily.  . Multiple Vitamin  (MULTI-VITAMINS) TABS Take 1 tablet by mouth daily.   . nitroGLYCERIN (NITROSTAT) 0.4 MG SL tablet Place 1 tablet (0.4 mg total) under the tongue every 5 (five) minutes as needed for chest pain.  [name] omeprazole (PRILOSEC) 40 MG capsule Take 40 mg by mouth daily.  . Oxycodone HCl 10 MG TABS Take 10 mg by mouth every 6 (six) hours as needed (pain).   . sacubitril-valsartan (ENTRESTO) 24-26 MG Take 1 tablet by mouth 2 (two) times daily.  [name] spironolactone (ALDACTONE) 25 MG tablet Take 1 tablet (25 mg total) by mouth daily.  . SYMBICORT 80-4.5 MCG/ACT inhaler Inhale 2 puffs into the lungs daily.   [name] torsemide (DEMADEX) 20 MG tablet Take 3 tablets (60 mg total) by mouth daily.  . traZODone (DESYREL) 150 MG tablet Take 150 mg by mouth at bedtime.  . [DISCONTINUED] calcium carbonate (TUMS - DOSED IN MG ELEMENTAL CALCIUM) 500 MG chewable tablet [name] 4 tablets by mouth daily as needed for indigestion or heartburn. (Patient not taking: Reported on 01/18/2020)  . [DISCONTINUED] Insulin Glargine (LANTUS SOLOSTAR) 100 UNIT/ML Solostar Pen Inject 30 Units into the skin at bedtime. (Patient not taking: Reported on 01/18/2020)  . [DISCONTINUED] insulin lispro (HUMALOG) 100 UNIT/ML injection Sliding Scale Insulin before meals using Novolog If BS is 0 to 70 no insulin and eat/glucose tablets/glucose get If BS 71 to 150 great! If BS 151 to 200, 2 Units Novolog If BS 201 to 250,  4 Units Novolog If BS 251 to 300, 6 Units Novolog If BS 301 to 350, 8 Units Novolog If BS 351 to 400, 10 Units Novolog If BS greater than 400, take 10 Units and Contact our office during office hour (Patient not taking: Reported on 01/18/2020)    Past Medical History:  Diagnosis Date  . Arthritis   . Asthma   . Atherosclerosis   . BPH (benign prostatic hyperplasia)   . Carotid arterial disease ([place])   . Charcot's joint of foot, right   . Chronic combined systolic (congestive) and diastolic (congestive) heart failure ([place])    a. 02/2013 EF 50% by  LV gram; b. 04/2017 Echo: EF 25-30%. diff HK. Gr2 DD. Mod dil LA/RV. PASP 46mmHg.  [name] COPD (chronic obstructive pulmonary disease) ([place])   . Coronary artery disease    a. 2013 S/P PCI of LAD [facility]);  b. 03/2013 PCI: RCA 90p ( 2.5 x 23 mm DES); c. 02/2014 Cath: patent RCA stent->Med Rx; d. 04/2017 Cath: LM nl, LAd 30ost/p, 49m/d, D1 80ost, LCX 90p/m, OM1 85, RCA 70/20p, 38m, 70d->Referred for CT Surg-felt to be poor candidate.  . Diabetic neuropathy ([place])   . Diabetic ulcer of right foot ([place])   . Hernia   . Hyperlipidemia   . Hypertension   . Ischemic cardiomyopathy    a. 04/2017 Echo: EF 25-30%; b. 08/2017 Cardiac MRI ([facility]): EF 19%, sev glob HK. RVEF 20%, mod BAE, triv MR, mild-mod TR. Basal lateral subendocardial infarct (viable), inf/infsept, dist septal ischemia, basal to mid lat peri-infarct ischemia.  . Morbid obesity ([place])   . Neuropathy   . PAD (peripheral artery disease) ([place])    a. Followed by Dr. Lucky Cowboy; b. 08/2010 Periph Angio: RSFA 70-80p (6X60 self-expanding stent); c. 09/2010 Periph Angio: L SFA 100p (7x unknown length self-expanding stent); d. 06/2015 Periph Angio: R SFA short segment occlusion (6x12 self-expanding stent).  . Restless leg syndrome   . Sleep apnea   . Subclavian artery stenosis, right ([place])   . Syncope and collapse   . Tobacco use    a. 75+ yr hx - still smoking 1ppd, down from 2 ppd.  . Type II diabetes mellitus ([place])   . Varicose vein     Past Surgical History:  Procedure Laterality Date  . ABDOMINAL AORTAGRAM N/A 06/23/2013   Procedure: ABDOMINAL Maxcine Ham;  Surgeon: Wellington Hampshire, MD;  Location: [place] CATH LAB;  Service: Cardiovascular;  Laterality: N/A;  . AMPUTATION Right 04/01/2018   Procedure: AMPUTATION BELOW KNEE;  Surgeon: Algernon Huxley, MD;  Location: [facility] ORS;  Service: Vascular;  Laterality: Right;  . APPENDECTOMY    . CARDIAC CATHETERIZATION  10/14   [facility]; X1 STENT PROXIMAL RCA  . CARDIAC CATHETERIZATION  02/2010   [facility]  . CARDIAC CATHETERIZATION   02/21/2014   [facility]  . CLAVICLE SURGERY    . HERNIA REPAIR    . IRRIGATION AND DEBRIDEMENT FOOT Right 01/04/2018   Procedure: IRRIGATION AND DEBRIDEMENT FOOT;  Surgeon: Samara Deist, DPM;  Location: [facility] ORS;  Service: Podiatry;  Laterality: Right;  . LEFT HEART CATH AND CORONARY ANGIOGRAPHY N/A 01/20/2020   Procedure: LEFT HEART CATH AND CORONARY ANGIOGRAPHY;  Surgeon: Wellington Hampshire, MD;  Location: [place] CV LAB;  Service: Cardiovascular;  Laterality: N/A;  . LEFT HEART CATH AND CORS/GRAFTS ANGIOGRAPHY N/A 04/28/2017   Procedure: LEFT HEART CATH AND CORONARY ANGIOGRAPHY;  Surgeon: Wellington Hampshire, MD;  Location: [place] CV LAB;  Service: Cardiovascular;  Laterality:  N/A;  . LOWER EXTREMITY ANGIOGRAPHY Right 03/03/2018   Procedure: LOWER EXTREMITY ANGIOGRAPHY;  Surgeon: Katha Cabal, MD;  Location: [place] CV LAB;  Service: Cardiovascular;  Laterality: Right;  . LOWER EXTREMITY ANGIOGRAPHY Left 08/24/2018   Procedure: LOWER EXTREMITY ANGIOGRAPHY;  Surgeon: Algernon Huxley, MD;  Location: [place] CV LAB;  Service: Cardiovascular;  Laterality: Left;  . LOWER EXTREMITY ANGIOGRAPHY Left 06/14/2019   Procedure: LOWER EXTREMITY ANGIOGRAPHY;  Surgeon: Algernon Huxley, MD;  Location: [place] CV LAB;  Service: Cardiovascular;  Laterality: Left;  . LOWER EXTREMITY ANGIOGRAPHY Left 08/02/2019   Procedure: LOWER EXTREMITY ANGIOGRAPHY;  Surgeon: Algernon Huxley, MD;  Location: [place] CV LAB;  Service: Cardiovascular;  Laterality: Left;  . PERIPHERAL ARTERIAL STENT GRAFT     x2 left/right  . PERIPHERAL VASCULAR BALLOON ANGIOPLASTY Right 01/06/2018   Procedure: PERIPHERAL VASCULAR BALLOON ANGIOPLASTY;  Surgeon: Katha Cabal, MD;  Location: [place] CV LAB;  Service: Cardiovascular;  Laterality: Right;  . PERIPHERAL VASCULAR CATHETERIZATION N/A 07/06/2015   Procedure: Abdominal Aortogram w/Lower Extremity;  Surgeon: Algernon Huxley, MD;  Location: [place] CV LAB;   Service: Cardiovascular;  Laterality: N/A;  . PERIPHERAL VASCULAR CATHETERIZATION  07/06/2015   Procedure: Lower Extremity Intervention;  Surgeon: Algernon Huxley, MD;  Location: [place] CV LAB;  Service: Cardiovascular;;    Social History Social History   Tobacco Use  . Smoking status: Current Every Day Smoker    Packs/day: 1.00    Years: 41.00    Pack years: 41.00    Types: Cigarettes  . Smokeless tobacco: Never Used  Vaping Use  . Vaping Use: Never used  Substance Use Topics  . Alcohol use: No  . Drug use: No    Family History Family History  Problem Relation Age of Onset  . Heart attack Father   . Heart disease Father   . Hypertension Father   . Hyperlipidemia Father   . Hypertension Mother   . Hyperlipidemia Mother     Allergies  Allergen Reactions  . Dulaglutide Anaphylaxis, Diarrhea and Hives  . Other Itching    Skin itching associated with nitro patch  . Prednisone Rash     REVIEW OF SYSTEMS (Negative unless checked)  Constitutional: [] Weight loss  [] Fever  [] Chills Cardiac: [] Chest pain   [] Chest pressure   [] Palpitations   [] Shortness of breath when laying flat   [] Shortness of breath with exertion. Vascular:  [x] Pain in legs with walking   [x] Pain in legs at rest  [] History of DVT   [] Phlebitis   [] Swelling in legs   [] Varicose veins   [] Non-healing ulcers Pulmonary:   [] Uses home oxygen   [] Productive cough   [] Hemoptysis   [] Wheeze  [] COPD   [] Asthma Neurologic:  [] Dizziness   [] Seizures   [] History of stroke   [] History of TIA  [] Aphasia   [] Vissual changes   [] Weakness or numbness in arm   [] Weakness or numbness in leg Musculoskeletal:   [] Joint swelling   [x] Joint pain   [] Low back pain Hematologic:  [] Easy bruising  [] Easy bleeding   [] Hypercoagulable state   [] Anemic Gastrointestinal:  [] Diarrhea   [] Vomiting  [] Gastroesophageal reflux/heartburn   [] Difficulty swallowing. Genitourinary:  [] Chronic kidney disease   [] Difficult urination   [] Frequent urination   [] Blood in urine Skin:  [] Rashes   [] Ulcers  Psychological:  [] History of anxiety   []  History of major depression.  Physical Examination  Vitals:   01/17/20 1126  BP: 124/87  Pulse: (!) 124  Resp: 16   There is no height or weight on file to calculate BMI. Gen: WD/WN, NAD Head: [place]/AT, No temporalis wasting.  Ear/Nose/Throat: Hearing grossly intact, nares w/o erythema or drainage Eyes: PER, EOMI, sclera nonicteric.  Neck: Supple, no large masses.   Pulmonary:  Good air movement, no audible wheezing bilaterally, no use of accessory muscles.  Cardiac: RRR, no JVD Vascular:  Vessel Right Left  Radial Palpable Palpable  PT Not Palpable Not Palpable  DP Not Palpable Not Palpable  Gastrointestinal: Non-distended. No guarding/no peritoneal signs.  Musculoskeletal: M/S 5/5 throughout.  No deformity or atrophy.  Neurologic: CN 2-12 intact. Symmetrical.  Speech is fluent. Motor exam as listed above. Psychiatric: Judgment intact, Mood & affect appropriate for pt's clinical situation. Dermatologic: No rashes or ulcers noted.  No changes consistent with cellulitis.  CBC Lab Results  Component Value Date   WBC 4.7 01/20/2020   HGB 10.6 (L) 01/20/2020   HCT 32.0 (L) 01/20/2020   MCV 86.0 01/20/2020   PLT 153 01/20/2020    BMET    Component Value Date/Time   NA 132 (L) 01/20/2020 0352   NA 134 11/03/2019 1406   NA 136 06/08/2014 1037   K 3.5 01/20/2020 0352   K 4.3 06/08/2014 1037   CL 90 (L) 01/20/2020 0352   CL 101 06/08/2014 1037   CO2 33 (H) 01/20/2020 0352   CO2 27 06/08/2014 1037   GLUCOSE 120 (H) 01/20/2020 0352   GLUCOSE 415 (H) 06/08/2014 1037   BUN 21 (H) 01/20/2020 0352   BUN 25 (H) 11/03/2019 1406   BUN 9 06/08/2014 1037   CREATININE 1.23 01/20/2020 0352   CREATININE 0.87 02/05/2018 1417   CALCIUM 8.4 (L) 01/20/2020 0352   CALCIUM 9.2 06/08/2014 1037   GFRNONAA >60 01/20/2020 0352   GFRNONAA >60 06/08/2014 1037   GFRNONAA >60  02/17/2014 0909   GFRAA >60 01/20/2020 0352   GFRAA >60 06/08/2014 1037   GFRAA >60 02/17/2014 0909   Estimated Creatinine Clearance: 70.7 mL/min (by C-G formula based on SCr of 1.23 mg/dL).  COAG Lab Results  Component Value Date   INR 1.2 01/19/2020   INR 1.13 04/01/2018   INR 1.25 03/30/2018    Radiology DG Chest 2 View  Result Date: 01/19/2020 CLINICAL DATA:  Chest pain EXAM: CHEST - 2 VIEW COMPARISON:  September 25, 2009 FINDINGS: The cardiomediastinal silhouette is unchanged in contour.Atherosclerotic calcifications of the aorta. No pleural effusion. No pneumothorax. No acute pleuroparenchymal abnormality. Visualized abdomen is unremarkable. Status post RIGHT shoulder surgery. Multilevel degenerative changes of the thoracic spine. IMPRESSION: No acute cardiopulmonary abnormality. Electronically Signed   By: Valentino Saxon MD   On: 01/19/2020 10:14   CARDIAC CATHETERIZATION  Result Date: 01/20/2020  Prox RCA-1 lesion is 80% stenosed.  Prox RCA-2 lesion is 20% stenosed.  Prox RCA to Mid RCA lesion is 80% stenosed.  Prox LAD lesion is 20% stenosed.  1st Diag lesion is 80% stenosed.  Mid LAD lesion is 20% stenosed.  Dist LAD lesion is 60% stenosed.  Prox Cx to Mid Cx lesion is 90% stenosed.  LPAV lesion is 95% stenosed.  1. Significant underlying three-vessel coronary artery disease. Patent proximal LAD stent with mild in-stent restenosis. Moderate diffuse disease in the distal LAD. Significant stenosis in the proximal left circumflex at the origin of the posterior AV groove artery which is also heavily diseased at the ostium. This is a bifurcation lesion and heavily calcified. RCA stent is patent.  However, there is significant proximal disease in the whole mid to distal segment is diffusely diseased and small caliber. 2. Left ventricular angiography was not performed. EF was severe reduced by echo. 3. Moderately elevated left ventricular end-diastolic pressure at 28 mmHg  Recommendations: The patient's cardiomyopathy seems to be out of proportion to his coronary artery disease as the LAD itself does not seem to have obstructive disease at the present time. The RCA is diffusely diseased and too small to stent. The only target for revascularization would be the left circumflex but it is a calcified bifurcation lesion and likely requires atherectomy. In order to do this safely, the patient has to be optimized from a heart failure standpoint as he appears to be volume overloaded. Recommend intravenous diuresis. If limited by hypotension, we might need to consider inotropic support and small dose norepinephrine drip initially. I added small dose digoxin. Treat underlying atrial fibrillation. Resume heparin drip 8 hours after sheath pull.   ECHOCARDIOGRAM COMPLETE  Result Date: 01/20/2020    ECHOCARDIOGRAM REPORT   Patient Name:   LEVANTE SIMONES Date of Exam: 01/20/2020 Medical Rec #:  [id number]      Height:       67.0 in Accession #:    [id number]     Weight:       200.2 lb Date of Birth:  1960/12/04       BSA:          2.023 m Patient Age:    5 years       BP:           88/59 mmHg Patient Gender: M              HR:           61 bpm. Exam Location:  [facility] Procedure: 2D Echo, Color Doppler, Cardiac Doppler and Intracardiac            Opacification Agent Indications:     I21.4 NSTEMI  History:         Patient has prior history of Echocardiogram examinations, most                  recent 09/28/2019. CAD, COPD and PAD, Arrythmias:RBBB and Atrial                  Fibrillation; Risk Factors:Current Smoker, Sleep Apnea,                  Hypertension, Diabetes and Dyslipidemia.  Sonographer:     Charmayne Sheer RDCS (AE) Referring Phys:  [id number] Soledad Gerlach NIU Diagnosing Phys: Ida Rogue MD  Sonographer Comments: Suboptimal apical window. IMPRESSIONS  1. Left ventricular ejection fraction, by estimation, is <20%. The left ventricle has severely decreased function. The left ventricle demonstrates global  hypokinesis. The left ventricular internal cavity size was moderately dilated. Left ventricular diastolic parameters are indeterminate.  2. Right ventricular systolic function is moderately reduced. The right ventricular size is moderately enlarged. There is normal pulmonary artery systolic pressure. The estimated right ventricular systolic pressure is 99.8 mmHg.  3. Left atrial size was mildly dilated.  4. Mild mitral valve regurgitation.  5. Tricuspid valve regurgitation is moderate. FINDINGS  Left Ventricle: Left ventricular ejection fraction, by estimation, is <20%. The left ventricle has severely decreased function. The left ventricle demonstrates global hypokinesis. Definity contrast agent was given IV to delineate the left ventricular endocardial borders. The left ventricular internal cavity size was moderately dilated. There is no left ventricular  hypertrophy. Left ventricular diastolic parameters are indeterminate. Right Ventricle: The right ventricular size is moderately enlarged. No increase in right ventricular wall thickness. Right ventricular systolic function is moderately reduced. There is normal pulmonary artery systolic pressure. The tricuspid regurgitant velocity is 2.17 m/s, and with an assumed right atrial pressure of 10 mmHg, the estimated right ventricular systolic pressure is 36.1 mmHg. Left Atrium: Left atrial size was mildly dilated. Right Atrium: Right atrial size was normal in size. Pericardium: There is no evidence of pericardial effusion. Mitral Valve: The mitral valve is normal in structure. Normal mobility of the mitral valve leaflets. Mild mitral valve regurgitation. No evidence of mitral valve stenosis. MV peak gradient, 4.2 mmHg. The mean mitral valve gradient is 2.0 mmHg. Tricuspid Valve: The tricuspid valve is normal in structure. Tricuspid valve regurgitation is moderate . No evidence of tricuspid stenosis. Aortic Valve: The aortic valve was not well visualized. Aortic valve  regurgitation is not visualized. Mild to moderate aortic valve sclerosis/calcification is present, without any evidence of aortic stenosis. Aortic valve mean gradient measures 4.0 mmHg.  Aortic valve peak gradient measures 6.7 mmHg. Aortic valve area, by VTI measures 2.06 cm. Pulmonic Valve: The pulmonic valve was normal in structure. Pulmonic valve regurgitation is not visualized. No evidence of pulmonic stenosis. Aorta: The aortic root is normal in size and structure. Venous: The inferior vena cava is normal in size with greater than 50% respiratory variability, suggesting right atrial pressure of 3 mmHg. IAS/Shunts: No atrial level shunt detected by color flow Doppler.  LEFT VENTRICLE PLAX 2D LVIDd:         6.38 cm      Diastology LVIDs:         6.04 cm      LV e' lateral:   5.33 cm/s LV PW:         1.16 cm      LV E/e' lateral: 17.4 LV IVS:        1.03 cm      LV e' medial:    3.05 cm/s LVOT diam:     2.60 cm      LV E/e' medial:  30.3 LV SV:         48 LV SV Index:   24 LVOT Area:     5.31 cm  LV Volumes (MOD) LV vol d, MOD A2C: 240.0 ml LV vol d, MOD A4C: 251.0 ml LV vol s, MOD A2C: 224.0 ml LV vol s, MOD A4C: 214.0 ml LV SV MOD A2C:     16.0 ml LV SV MOD A4C:     251.0 ml LV SV MOD BP:      22.5 ml RIGHT VENTRICLE RV Basal diam:  4.32 cm LEFT ATRIUM             Index       RIGHT ATRIUM           Index LA diam:        4.70 cm 2.32 cm/m  RA Area:     20.50 cm LA Vol (A2C):   61.4 ml 30.35 ml/m RA Volume:   70.50 ml  34.85 ml/m LA Vol (A4C):   74.5 ml 36.82 ml/m LA Biplane Vol: 69.9 ml 34.55 ml/m  AORTIC VALVE                    PULMONIC VALVE AV Area (Vmax):    2.18 cm     PV Vmax:  0.75 m/s AV Area (Vmean):   1.87 cm     PV Vmean:      54.800 cm/s AV Area (VTI):     2.06 cm     PV VTI:        0.120 m AV Vmax:           129.00 cm/s  PV Peak grad:  2.2 mmHg AV Vmean:          101.000 cm/s PV Mean grad:  1.0 mmHg AV VTI:            0.233 m AV Peak Grad:      6.7 mmHg AV Mean Grad:      4.0 mmHg  LVOT Vmax:         52.90 cm/s LVOT Vmean:        35.500 cm/s LVOT VTI:          0.090 m LVOT/AV VTI ratio: 0.39  AORTA Ao Root diam: 3.00 cm MITRAL VALVE               TRICUSPID VALVE MV Area (PHT): 5.06 cm    TR Peak grad:   18.8 mmHg MV Peak grad:  4.2 mmHg    TR Vmax:        217.00 cm/s MV Mean grad:  2.0 mmHg MV Vmax:       1.03 m/s    SHUNTS MV Vmean:      59.4 cm/s   Systemic VTI:  0.09 m MV Decel Time: 150 msec    Systemic Diam: 2.60 cm MV E velocity: 92.50 cm/s Ida Rogue MD Electronically signed by Ida Rogue MD Signature Date/Time: 01/20/2020/6:20:54 PM    Final      Assessment/Plan 1. Lymphedema No surgery or intervention at this point in time.    I have reviewed my discussion with the patient regarding venous insufficiency and secondary lymph edema and why it  causes symptoms. I have discussed with the patient the chronic skin changes that accompany these problems and the long term sequela such as ulceration and infection.  Patient will continue wearing graduated compression stockings class 1 (20-30 mmHg) on a daily basis a prescription was given to the patient to keep this updated. The patient will  put the stockings on first thing in the morning and removing them in the evening. The patient is instructed specifically not to sleep in the stockings.  In addition, behavioral modification including elevation during the day will be continued.  Diet and salt restriction was also discussed.  Previous duplex ultrasound of the lower extremities shows normal deep venous system, superficial reflux was not present.   Following the review of the ultrasound the patient will follow up in 12 months to reassess the degree of swelling and the control that graduated compression is offering.   The patient can be assessed for a Lymph Pump at that time.  However, at this time the patient states they are satisfied with the control compression and elevation is yielding.    2. PAD (peripheral artery  disease) (HCC)  Recommend:  The patient has evidence of atherosclerosis of the lower extremities with claudication.  The patient does not voice lifestyle limiting changes at this point in time.  Noninvasive studies do not suggest clinically significant change.  No invasive studies, angiography or surgery at this time The patient should continue walking and begin a more formal exercise program.  The patient should continue antiplatelet therapy and aggressive treatment of the lipid abnormalities  No changes in the patient's medications at this time  The patient should continue wearing graduated compression socks 10-15 mmHg strength to control the mild edema.   - VAS [place] ABI WITH/WO TBI; Future  3. Coronary artery disease of native artery of native heart with stable angina pectoris (HCC) Continue cardiac and antihypertensive medications as already ordered and reviewed, no changes at this time.  Continue statin as ordered and reviewed, no changes at this time  Nitrates PRN for chest pain   4. Essential hypertension Continue antihypertensive medications as already ordered, these medications have been reviewed and there are no changes at this time.   5. Persistent atrial fibrillation (HCC) Continue antiarrhythmia medications as already ordered, these medications have been reviewed and there are no changes at this time.  Continue anticoagulation as ordered by Cardiology Service    Hortencia Pilar, MD  01/23/2020 2:30 PM

## 2020-01-24 LAB — CULTURE, BLOOD (ROUTINE X 2)
Culture: NO GROWTH
Culture: NO GROWTH
Special Requests: ADEQUATE
Special Requests: ADEQUATE

## 2020-01-25 ENCOUNTER — Telehealth (HOSPITAL_COMMUNITY): Payer: Self-pay

## 2020-01-25 NOTE — Telephone Encounter (Signed)
Attempted to contact, no answer and unable to leave message.  Will continue to try to contact.   [place] [phone]

## 2020-01-30 DIAGNOSIS — I5020 Unspecified systolic (congestive) heart failure: Secondary | ICD-10-CM

## 2020-01-30 DIAGNOSIS — J441 Chronic obstructive pulmonary disease with (acute) exacerbation: Secondary | ICD-10-CM | POA: Insufficient documentation

## 2020-01-30 HISTORY — DX: Chronic obstructive pulmonary disease with (acute) exacerbation: J44.1

## 2020-01-30 HISTORY — DX: Unspecified systolic (congestive) heart failure: I50.20

## 2020-02-03 ENCOUNTER — Other Ambulatory Visit: Payer: Self-pay

## 2020-02-03 ENCOUNTER — Encounter: Payer: Self-pay | Admitting: Emergency Medicine

## 2020-02-03 ENCOUNTER — Ambulatory Visit
Admission: EM | Admit: 2020-02-03 | Discharge: 2020-02-03 | Disposition: A | Discharge status: Home / Self Care | Payer: Medicare Other | Attending: Physician Assistant | Admitting: Physician Assistant

## 2020-02-03 DIAGNOSIS — H5711 Ocular pain, right eye: Secondary | ICD-10-CM | POA: Diagnosis not present

## 2020-02-03 DIAGNOSIS — S0501XA Injury of conjunctiva and corneal abrasion without foreign body, right eye, initial encounter: Secondary | ICD-10-CM | POA: Diagnosis not present

## 2020-02-03 MED ORDER — ERYTHROMYCIN 5 MG/GM OP OINT: TOPICAL_OINTMENT | OPHTHALMIC | 0 refills | Status: AC

## 2020-02-03 NOTE — ED Provider Notes (Signed)
[facility]    CSN: [id number] Arrival date & time: 02/03/20  1258      History   Chief Complaint Chief Complaint  Patient presents with  . Blurred Vision  . Eye Pain    right    HPI [name] [name] is a 59 y.o. male.   59 year old male presents with sudden onset of right eye pain, redness, blurry vision this morning.  He denies known injury and says he sleeps with his Vicks humidifier on at night and is unsure if that is related to his current eye problem.  Patient also admits to watery drainage from the eye and photosensitivity.  He is a diabetic.  He also has history of hypertension, hyperlipidemia, obesity, arterial disease, coronary artery disease, and CHF.  He has not taken any over-the-counter medications for his symptoms.  He denies any fever, fatigue, headaches, nausea, vomiting, or dizziness.  No other concerns today     Past Medical History:  Diagnosis Date  . Arthritis   . Asthma   . Atherosclerosis   . BPH (benign prostatic hyperplasia)   . Carotid arterial disease ([place])   . Charcot's joint of foot, right   . Chronic combined systolic (congestive) and diastolic (congestive) heart failure ([place])    a. 02/2013 EF 50% by LV gram; b. 04/2017 Echo: EF 25-30%. diff HK. Gr2 DD. Mod dil LA/RV. PASP 2mmHg.  [name] COPD (chronic obstructive pulmonary disease) ([place])   . Coronary artery disease    a. 2013 S/P PCI of LAD [facility]);  b. 03/2013 PCI: RCA 90p ( 2.5 x 23 mm DES); c. 02/2014 Cath: patent RCA stent->Med Rx; d. 04/2017 Cath: LM nl, LAd 30ost/p, 105m/d, D1 80ost, LCX 90p/m, OM1 85, RCA 70/20p, 2m, 70d->Referred for CT Surg-felt to be poor candidate.  . Diabetic neuropathy ([place])   . Diabetic ulcer of right foot ([place])   . Hernia   . Hyperlipidemia   . Hypertension   . Ischemic cardiomyopathy    a. 04/2017 Echo: EF 25-30%; b. 08/2017 Cardiac MRI ([facility]): EF 19%, sev glob HK. RVEF 20%, mod BAE, triv MR, mild-mod TR. Basal lateral subendocardial infarct (viable),  inf/infsept, dist septal ischemia, basal to mid lat peri-infarct ischemia.  . Morbid obesity ([place])   . Neuropathy   . PAD (peripheral artery disease) ([place])    a. Followed by Dr. Lucky Cowboy; b. 08/2010 Periph Angio: RSFA 70-80p (6X60 self-expanding stent); c. 09/2010 Periph Angio: L SFA 100p (7x unknown length self-expanding stent); d. 06/2015 Periph Angio: R SFA short segment occlusion (6x12 self-expanding stent).  . Restless leg syndrome   . Sleep apnea   . Subclavian artery stenosis, right ([place])   . Syncope and collapse   . Tobacco use    a. 75+ yr hx - still smoking 1ppd, down from 2 ppd.  . Type II diabetes mellitus ([place])   . Varicose vein     Patient Active Problem List   Diagnosis Date Noted  . Atrial fibrillation ([place]) 01/19/2020  . Chronic bilateral low back pain with sciatica 10/08/2019  . Cellulitis of left lower leg 09/27/2019  . Cellulitis of foot, left 09/27/2019  . Hyponatremia 09/27/2019  . Cellulitis of left leg 08/31/2019  . CAD (coronary artery disease) 08/31/2019  . Acute on chronic systolic CHF (congestive heart failure) ([place]) 11/05/2018  . Open toe wound 10/01/2018  . Venous ulcer of left leg ([place]) 08/19/2018  . 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[place]) 04/28/2018  . Sepsis ([place]) 03/30/2018  .  Abnormal MRI, shoulder (Right) 02/23/2018  . Abnormal MRI, cervical spine (2016) 02/23/2018  . DDD (degenerative disc disease), cervical 02/23/2018  . Cervical foraminal stenosis (Bilateral) 02/23/2018  . Cervical central spinal stenosis 02/23/2018  . Cervical facet hypertrophy 02/23/2018  . Arthralgia of acromioclavicular joint (Right) 02/23/2018  . Osteoarthritis of  AC (acromioclavicular) joint (Right) 02/23/2018  . Biceps tendinosis of shoulder (Right) 02/23/2018  . Tendinopathy of rotator cuff (Right) 02/23/2018  . Vitamin D deficiency 02/23/2018  . Atherosclerosis of native arteries of the extremities with ulceration ([place]) 02/15/2018  . Peripheral edema  02/05/2018  . Status post peripherally inserted central catheter (PICC) central line placement 02/04/2018  . Chronic lower extremity pain (Primary Area of Pain) (Right) 01/29/2018  . Chronic shoulder pain [facility] Area of Pain) (Right) 01/29/2018  . Chronic foot pain (Secondary Area of Pain) (Right) 01/29/2018  . Chronic pain syndrome 01/29/2018  . Long term current use of opiate analgesic 01/29/2018  . Medication monitoring encounter 01/29/2018  . Disorder of skeletal system 01/29/2018  . Problems influencing health status 01/29/2018  . AKI (acute kidney injury) ([place]) 01/03/2018  . NSTEMI (non-ST elevated myocardial infarction) ([place]) 11/21/2017  . Coronary artery disease of native artery of native heart with stable angina pectoris ([place]) 09/10/2017  . HLD (hyperlipidemia) 09/10/2017  . Angina pectoris ([place]) 09/08/2017  . Angina at rest [facility]) 08/13/2017  . Lymphedema 05/02/2017  . Chronic systolic CHF (congestive heart failure) ([place]) 04/23/2017  . Chest pain 07/11/2016  . Unstable angina ([place]) 07/11/2016  . Foot ulcer (Right)   . Stable angina (H[facility])   . Pressure injury of skin 05/06/2016  . Diabetic foot infection ([place]) 05/05/2016  . Type 2 diabetes mellitus with other specified complication ([place]) 50/02/3817  . Cellulitis 11/21/2015  . Morbid obesity ([place]) 11/17/2014  . PAD (peripheral artery disease) ([place])   . Hypertension   . Tobacco use   . COPD (chronic obstructive pulmonary disease) ([place]) 05/19/2012  . Diabetic neuropathy ([place]) 05/19/2012    Past Surgical History:  Procedure Laterality Date  . ABDOMINAL AORTAGRAM N/A 06/23/2013   Procedure: ABDOMINAL Maxcine Ham;  Surgeon: Wellington Hampshire, MD;  Location: [place] C[facility];  Service: Cardiovascular;  Laterality: N/A;  . AMPUTATION Right 04/01/2018   Procedure: AMPUTATION BELOW KNEE;  Surgeon: Algernon Huxley, MD;  Location: [facility] ORS;  Service: Vascular;  Laterality: Right;  . APPENDECTOMY    . CARDIAC CATHETERIZATION  10/14   [facility];  X1 STENT PROXIMAL RCA  . CARDIAC CATHETERIZATION  02/2010   [facility]  . CARDIAC CATHETERIZATION  02/21/2014   [facility]  . CLAVICLE SURGERY    . HERNIA REPAIR    . IRRIGATION AND DEBRIDEMENT FOOT Right 01/04/2018   Procedure: IRRIGATION AND DEBRIDEMENT FOOT;  Surgeon: Samara Deist, DPM;  Location: [facility] ORS;  Service: Podiatry;  Laterality: Right;  . LEFT HEART CATH AND CORONARY ANGIOGRAPHY N/A 01/20/2020   Procedure: LEFT HEART CATH AND CORONARY ANGIOGRAPHY;  Surgeon: Wellington Hampshire, MD;  Location: [place] CV LAB;  Service: Cardiovascular;  Laterality: N/A;  . LEFT HEART CATH AND CORS/GRAFTS ANGIOGRAPHY N/A 04/28/2017   Procedure: LEFT HEART CATH AND CORONARY ANGIOGRAPHY;  Surgeon: Wellington Hampshire, MD;  Location: [place] [facility];  Service: Cardiovascular;  Laterality: N/A;  . LOWER EXTREMITY ANGIOGRAPHY Right 03/03/2018   Procedure: LOWER EXTREMITY ANGIOGRAPHY;  Surgeon: Katha Cabal, MD;  Location: [place] [facility] LAB;  Service: Cardiovascular;  Laterality: Right;  . LOWER EXTREMITY ANGIOGRAPHY Left 08/24/2018   Procedure: LOWER  EXTREMITY ANGIOGRAPHY;  Surgeon: Algernon Huxley, MD;  Location: [place] CV LAB;  Service: Cardiovascular;  Laterality: Left;  . LOWER EXTREMITY ANGIOGRAPHY Left 06/14/2019   Procedure: LOWER EXTREMITY ANGIOGRAPHY;  Surgeon: Algernon Huxley, MD;  Location: [place] [facility];  Service: Cardiovascular;  Laterality: Left;  . LOWER EXTREMITY ANGIOGRAPHY Left 08/02/2019   Procedure: LOWER EXTREMITY ANGIOGRAPHY;  Surgeon: Algernon Huxley, MD;  Location: [place] [facility];  Service: Cardiovascular;  Laterality: Left;  . PERIPHERAL ARTERIAL STENT GRAFT     x2 left/right  . PERIPHERAL VASCULAR BALLOON ANGIOPLASTY Right 01/06/2018   Procedure: PERIPHERAL VASCULAR BALLOON ANGIOPLASTY;  Surgeon: Katha Cabal, MD;  Location: [place] CV [facility];  Service: Cardiovascular;  Laterality: Right;  . PERIPHERAL VASCULAR CATHETERIZATION N/A 07/06/2015   Procedure:  Abdominal Aortogram w/Lower Extremity;  Surgeon: Algernon Huxley, MD;  Location: [place] [facility];  Service: Cardiovascular;  Laterality: N/A;  . PERIPHERAL VASCULAR CATHETERIZATION  07/06/2015   Procedure: Lower Extremity Intervention;  Surgeon: Algernon Huxley, MD;  Location: [place] CV [facility];  Service: Cardiovascular;;       Home Medications    Prior to Admission medications   Medication Sig Start Date End Date Taking? Authorizing Provider  albuterol (VENTOLIN HFA) 108 (90 Base) MCG/ACT inhaler Inhale into the lungs every 6 (six) hours as needed for wheezing or shortness of breath.   Yes [provider]  aspirin EC 81 MG tablet Take 1 tablet (81 mg total) by mouth daily. 05/11/15  Yes Wellington Hampshire, MD  atorvastatin (LIPITOR) 80 MG tablet Take 80 mg by mouth daily.   Yes [provider]  clopidogrel (PLAVIX) 75 MG tablet Take 75 mg by mouth daily.  08/22/17  Yes [provider]  collagenase (SANTYL) ointment Apply 1 application topically daily. 08/11/19  Yes Kris Hartmann, NP  DULoxetine (CYMBALTA) 20 MG capsule Take 20 mg by mouth daily.   Yes [provider]  ezetimibe (ZETIA) 10 MG tablet TAKE 1 TABLET BY MOUTH ONCE DAILY Patient taking differently: Take 10 mg by mouth daily.  11/07/19  Yes Hackney, Otila Kluver A, FNP  gabapentin (NEURONTIN) 800 MG tablet Take 800 mg by mouth 4 (four) times daily.  08/14/17 02/03/20 Yes [provider]  ipratropium-albuterol (DUONEB) 0.5-2.5 (3) MG/3ML SOLN Inhale 3 mLs into the lungs every 6 (six) hours as needed (wheezing/sob).    Yes [provider]  isosorbide mononitrate (IMDUR) 30 MG 24 hr tablet TAKE 1 TABLET BY MOUTH ONCE DAILY Patient taking differently: Take 30 mg by mouth daily.  11/07/19  Yes Hackney, Otila Kluver A, FNP  metFORMIN (GLUCOPHAGE-XR) 500 MG 24 hr tablet Take 1,000 mg by mouth 2 (two) times daily.    Yes [provider]  metoprolol succinate (TOPROL-XL) 25 MG 24 hr tablet Take 1  tablet (25 mg total) by mouth daily. 07/02/19 08/29/20 Yes Loel Dubonnet, NP  Multiple Vitamin (MULTI-VITAMINS) TABS Take 1 tablet by mouth daily.    Yes [provider]  nitroGLYCERIN (NITROSTAT) 0.4 MG SL tablet Place 1 tablet (0.4 mg total) under the tongue every 5 (five) minutes as needed for chest pain. 05/19/19  Yes Hackney, Otila Kluver A, FNP  nystatin (MYCOSTATIN) 100000 UNIT/ML suspension Take 5 mLs by mouth 4 (four) times daily. 01/01/20  Yes [provider]  omeprazole (PRILOSEC) 40 MG capsule Take 40 mg by mouth daily.   Yes [provider]  Oxycodone HCl 10 MG TABS Take 10 mg by mouth  every 6 (six) hours as needed (pain).  01/15/18  Yes [provider]  potassium chloride (KLOR-CON) 10 MEQ tablet Take 1 tablet (10 mEq total) by mouth as directed. Take 1 tablet twice a day on days with metolazone take 4 tablets (40 mEq) 10/04/19 02/03/20 Yes Gollan, Kathlene November, MD  promethazine (PHENERGAN) 25 MG tablet Take 25 mg by mouth every 6 (six) hours as needed. 01/01/20  Yes [provider]  sacubitril-valsartan (ENTRESTO) 24-26 MG Take 1 tablet by mouth 2 (two) times daily. 09/30/19  Yes Shahmehdi, Seyed A, MD  spironolactone (ALDACTONE) 25 MG tablet Take 1 tablet (25 mg total) by mouth daily. 10/01/19  Yes Shahmehdi, Valeria Batman, MD  SYMBICORT 80-4.5 MCG/ACT inhaler Inhale 2 puffs into the lungs daily.  01/01/20  Yes [provider]  torsemide (DEMADEX) 20 MG tablet Take 3 tablets (60 mg total) by mouth daily. 11/03/19  Yes Loel Dubonnet, NP  traZODone (DESYREL) 150 MG tablet Take 150 mg by mouth at bedtime. 07/12/19  Yes [provider]  erythromycin ophthalmic ointment Place a 1/2 inch ribbon of ointment into the lower eyelid every 6hr 02/03/20 02/10/20  Danton Clap, PA-C    Family History Family History  Problem Relation Age of Onset  . Heart attack Father   . Heart disease Father   . Hypertension Father   . Hyperlipidemia Father   .  Hypertension Mother   . Hyperlipidemia Mother     Social History Social History   Tobacco Use  . Smoking status: Current Every Day Smoker    Packs/day: 1.00    Years: 41.00    Pack years: 41.00    Types: Cigarettes  . Smokeless tobacco: Never Used  Vaping Use  . Vaping Use: Never used  Substance Use Topics  . Alcohol use: No  . Drug use: No     Allergies   Dulaglutide, Other, and Prednisone   Review of Systems Review of Systems  Constitutional: Negative for fatigue and fever.  Eyes: Positive for photophobia, pain, discharge, redness and visual disturbance. Negative for itching.  Gastrointestinal: Negative for nausea and vomiting.  Skin: Negative for rash and wound.  Neurological: Negative for dizziness, weakness, numbness and headaches.     Physical Exam Triage Vital Signs ED Triage Vitals  Enc Vitals Group     BP 02/03/20 1356 (!) 82/59     Pulse Rate 02/03/20 1356 80     Resp 02/03/20 1356 18     Temp 02/03/20 1356 98.1 F (36.7 C)     Temp Source 02/03/20 1356 Oral     SpO2 02/03/20 1356 97 %     Weight 02/03/20 1355 197 lb (89.4 kg)     Height 02/03/20 1355 5\' 7"  (1.702 m)     Head Circumference --      Peak Flow --      Pain Score 02/03/20 1355 8     Pain Loc --      Pain Edu? --      Excl. in [place]? --    No data found.  Updated Vital Signs BP 107/67 (BP Location: Right Arm)   Pulse 80   Temp 98.1 F (36.7 C) (Oral)   Resp 18   Ht 5\' 7"  (1.702 m)   Wt 197 lb (89.4 kg)   SpO2 97%   BMI 30.85 kg/m   Visual Acuity Right Eye Distance:   Left Eye Distance:   Bilateral Distance:    Right Eye Near:  Left Eye Near:    Bilateral Near:     Physical Exam Vitals and nursing note reviewed.  Constitutional:      General: He is not in acute distress.    Appearance: Normal appearance. He is well-developed. He is obese. He is not ill-appearing, toxic-appearing or diaphoretic.  HENT:     Head: Normocephalic and atraumatic.  Eyes:     General:  Lids are normal. Lids are everted, no foreign bodies appreciated.        Right eye: Discharge (trace clear drainage) present. No foreign body.     Conjunctiva/sclera:     Right eye: Right conjunctiva is injected (mild).     Pupils: Pupils are equal, round, and reactive to light.     Right eye: Corneal abrasion (seen with fluorescein eye stain, ~6 o'clock, small abrasion) present.  Cardiovascular:     Rate and Rhythm: Normal rate.  Pulmonary:     Effort: Pulmonary effort is normal. No respiratory distress.  Musculoskeletal:     Cervical back: Neck supple.  Skin:    General: Skin is warm.  Neurological:     Mental Status: He is alert.  Psychiatric:        Mood and Affect: Mood normal.        Behavior: Behavior normal.        Thought Content: Thought content normal.      UC Treatments / Results  Labs (all labs ordered are listed, but only abnormal results are displayed) Labs Reviewed - No data to display  EKG   Radiology No results found.  Procedures Procedures (including critical care time)  Medications Ordered in UC Medications - No data to display  Initial Impression / Assessment and Plan / UC Course  I have reviewed the triage vital signs and the nursing notes.  Pertinent labs & imaging results that were available during my care of the patient were reviewed by me and considered in my medical decision making (see chart for details).   After instilling 1 drop of tetracaine eyedrops, fluorescein eye stain performed with evidence of small corneal abrasion.  Treating patient with erythromycin ointment.  Advised him to follow-up with eye specialist if pain is not improving or condition is worsening.  Final Clinical Impressions(s) / UC Diagnoses   Final diagnoses:  Abrasion of right cornea, initial encounter  Pain of right eye     Discharge Instructions     -There is a scratch of the eye. Use the ointment as prescribed to help prevent infection. Wear sunglasses  if you are sensitive to light.  You may also take Tylenol for pain relief.  You can try compresses on the eye for pain relief.  If you have any increased eye pain, redness, swelling, or pustular drainage from the eye please seek another exam at our urgent care or consider contacting an eye specialist such as [facility].    ED Prescriptions    Medication Sig Dispense Auth. Provider   erythromycin ophthalmic ointment Place a 1/2 inch ribbon of ointment into the lower eyelid every 6hr 3.5 g Danton Clap, PA-C     PDMP not reviewed this encounter.   Danton Clap, PA-C 02/03/20 1425

## 2020-02-03 NOTE — Discharge Instructions (Addendum)
-  There is a scratch of the eye. Use the ointment as prescribed to help prevent infection. Wear sunglasses if you are sensitive to light.  You may also take Tylenol for pain relief.  You can try compresses on the eye for pain relief.  If you have any increased eye pain, redness, swelling, or pustular drainage from the eye please seek another exam at our urgent care or consider contacting an eye specialist such as [facility].

## 2020-02-03 NOTE — ED Triage Notes (Signed)
Patient in today c/o right eye pain and blurry vision x this morning. Patient states he ran a humidifier last night with Vicks in it and he woke up with his eye hurting this morning.

## 2020-02-10 ENCOUNTER — Encounter (INDEPENDENT_AMBULATORY_CARE_PROVIDER_SITE_OTHER): Payer: Self-pay | Admitting: Vascular Surgery

## 2020-02-10 ENCOUNTER — Ambulatory Visit (INDEPENDENT_AMBULATORY_CARE_PROVIDER_SITE_OTHER): Payer: Medicare Other

## 2020-02-10 ENCOUNTER — Ambulatory Visit (INDEPENDENT_AMBULATORY_CARE_PROVIDER_SITE_OTHER): Payer: Medicare Other | Admitting: Vascular Surgery

## 2020-02-10 ENCOUNTER — Other Ambulatory Visit: Payer: Self-pay

## 2020-02-10 VITALS — BP 116/77 | HR 89 | Resp 16 | Wt 209.8 lb

## 2020-02-10 DIAGNOSIS — I10 Essential (primary) hypertension: Secondary | ICD-10-CM

## 2020-02-10 DIAGNOSIS — I739 Peripheral vascular disease, unspecified: Secondary | ICD-10-CM

## 2020-02-10 DIAGNOSIS — I214 Non-ST elevation (NSTEMI) myocardial infarction: Secondary | ICD-10-CM

## 2020-02-10 DIAGNOSIS — I7025 Atherosclerosis of native arteries of other extremities with ulceration: Secondary | ICD-10-CM

## 2020-02-10 DIAGNOSIS — E782 Mixed hyperlipidemia: Secondary | ICD-10-CM | POA: Diagnosis not present

## 2020-02-10 DIAGNOSIS — L03116 Cellulitis of left lower limb: Secondary | ICD-10-CM | POA: Diagnosis not present

## 2020-02-10 DIAGNOSIS — I872 Venous insufficiency (chronic) (peripheral): Secondary | ICD-10-CM | POA: Insufficient documentation

## 2020-02-10 MED ORDER — CEPHALEXIN 500 MG PO CAPS: 500.0000 mg | ORAL_CAPSULE | Freq: Three times a day (TID) | ORAL | 1 refills | Status: DC

## 2020-02-10 NOTE — Progress Notes (Signed)
MRN : [id number]  [name] [name] is a 59 y.o. (1960/12/29) male who presents with chief complaint of  Chief Complaint  Patient presents with  . Follow-up    3wk ultrasound follow up  .  History of Present Illness:   The patient returns to the office for followup and review of the noninvasive studies. There has been a significant deterioration in the lower extremity symptoms.  The patient notes interval shortening of their claudication distance and development of mild rest pain symptoms. There have been several new ulcers that have occurred since the last visit.  Also his edema is much worse  There have been no significant changes to the patient's overall health care.  The patient denies amaurosis fugax or recent TIA symptoms. There are no recent neurological changes noted. The patient denies history of DVT, PE or superficial thrombophlebitis. The patient denies recent episodes of angina or shortness of breath.   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 albuterol (VENTOLIN HFA) 108 (90 Base) MCG/ACT inhaler Inhale into the lungs every 6 (six) hours as needed for wheezing or shortness of breath.  [name] aspirin EC 81 MG tablet Take 1 tablet (81 mg total) by mouth daily.  [name] atorvastatin (LIPITOR) 80 MG tablet Take 80 mg by mouth daily.  . clopidogrel (PLAVIX) 75 MG tablet Take 75 mg by mouth daily.   . collagenase (SANTYL) ointment Apply 1 application topically daily.  . DULoxetine (CYMBALTA) 20 MG capsule Take 20 mg by mouth daily.  [name] ELIQUIS 5 MG TABS tablet Take 5 mg by mouth 2 (two) times daily.  . empagliflozin (JARDIANCE) 10 MG TABS tablet Take by mouth.  . erythromycin ophthalmic ointment Place a 1/2 inch ribbon of ointment into the lower eyelid every 6hr  . ezetimibe (ZETIA) 10 MG tablet TAKE 1 TABLET BY MOUTH ONCE DAILY (Patient taking differently: Take 10 mg by mouth daily. )  . ipratropium-albuterol (DUONEB) 0.5-2.5 (3) MG/3ML SOLN  Inhale 3 mLs into the lungs every 6 (six) hours as needed (wheezing/sob).   . isosorbide dinitrate (ISORDIL) 30 MG tablet Take by mouth.  . isosorbide mononitrate (IMDUR) 30 MG 24 hr tablet TAKE 1 TABLET BY MOUTH ONCE DAILY (Patient taking differently: Take 30 mg by mouth daily. )  . magnesium oxide (MAG-OX) 400 MG tablet Take by mouth.  . metFORMIN (GLUCOPHAGE-XR) 500 MG 24 hr tablet Take 1,000 mg by mouth 2 (two) times daily.   . metoprolol succinate (TOPROL-XL) 25 MG 24 hr tablet Take 1 tablet (25 mg total) by mouth daily.  . Multiple Vitamin (MULTI-VITAMINS) TABS Take 1 tablet by mouth daily.   . nitroGLYCERIN (NITROSTAT) 0.4 MG SL tablet Place 1 tablet (0.4 mg total) under the tongue every 5 (five) minutes as needed for chest pain.  [name] nystatin (MYCOSTATIN) 100000 UNIT/ML suspension Take 5 mLs by mouth 4 (four) times daily.  [name] omeprazole (PRILOSEC) 40 MG capsule Take 40 mg by mouth daily.  . Oxycodone HCl 10 MG TABS Take 10 mg by mouth every 6 (six) hours as needed (pain).   . pantoprazole (PROTONIX) 40 MG tablet Take 40 mg by mouth daily.  . promethazine (PHENERGAN) 25 MG tablet Take 25 mg by mouth every 6 (six) hours as needed.  . sacubitril-valsartan (ENTRESTO) 24-26 MG Take 1 tablet by mouth 2 (two) times daily.  [name] spironolactone (ALDACTONE) 25 MG tablet Take 1 tablet (25 mg total) by mouth daily.  [name]  SYMBICORT 80-4.5 MCG/ACT inhaler Inhale 2 puffs into the lungs daily.   [name] torsemide (DEMADEX) 20 MG tablet Take 3 tablets (60 mg total) by mouth daily.  . traZODone (DESYREL) 150 MG tablet Take 150 mg by mouth at bedtime.  [name] umeclidinium bromide (INCRUSE ELLIPTA) 62.5 MCG/INH AEPB Inhale into the lungs.    Past Medical History:  Diagnosis Date  . Arthritis   . Asthma   . Atherosclerosis   . BPH (benign prostatic hyperplasia)   . Carotid arterial disease ([place])   . Charcot's joint of foot, right   . Chronic combined systolic (congestive) and diastolic (congestive) heart failure ([place])      a. 02/2013 EF 50% by LV gram; b. 04/2017 Echo: EF 25-30%. diff HK. Gr2 DD. Mod dil LA/RV. PASP 2mmHg.  [name] COPD (chronic obstructive pulmonary disease) ([place])   . Coronary artery disease    a. 2013 S/P PCI of LAD [facility]);  b. 03/2013 PCI: RCA 90p ( 2.5 x 23 mm DES); c. 02/2014 Cath: patent RCA stent->Med Rx; d. 04/2017 Cath: LM nl, LAd 30ost/p, 70m/d, D1 80ost, LCX 90p/m, OM1 85, RCA 70/20p, 109m, 70d->Referred for CT Surg-felt to be poor candidate.  . Diabetic neuropathy ([place])   . Diabetic ulcer of right foot ([place])   . Hernia   . Hyperlipidemia   . Hypertension   . Ischemic cardiomyopathy    a. 04/2017 Echo: EF 25-30%; b. 08/2017 Cardiac MRI ([facility]): EF 19%, sev glob HK. RVEF 20%, mod BAE, triv MR, mild-mod TR. Basal lateral subendocardial infarct (viable), inf/infsept, dist septal ischemia, basal to mid lat peri-infarct ischemia.  . Morbid obesity ([place])   . Neuropathy   . PAD (peripheral artery disease) ([place])    a. Followed by Dr. Lucky Cowboy; b. 08/2010 Periph Angio: RSFA 70-80p (6X60 self-expanding stent); c. 09/2010 Periph Angio: L SFA 100p (7x unknown length self-expanding stent); d. 06/2015 Periph Angio: R SFA short segment occlusion (6x12 self-expanding stent).  . Restless leg syndrome   . Sleep apnea   . Subclavian artery stenosis, right ([place])   . Syncope and collapse   . Tobacco use    a. 75+ yr hx - still smoking 1ppd, down from 2 ppd.  . Type II diabetes mellitus ([place])   . Varicose vein     Past Surgical History:  Procedure Laterality Date  . ABDOMINAL AORTAGRAM N/A 06/23/2013   Procedure: ABDOMINAL Maxcine Ham;  Surgeon: Wellington Hampshire, MD;  Location: [place] CATH LAB;  Service: Cardiovascular;  Laterality: N/A;  . AMPUTATION Right 04/01/2018   Procedure: AMPUTATION BELOW KNEE;  Surgeon: Algernon Huxley, MD;  Location: [facility] ORS;  Service: Vascular;  Laterality: Right;  . APPENDECTOMY    . CARDIAC CATHETERIZATION  10/14   [facility]; X1 STENT PROXIMAL RCA  . CARDIAC CATHETERIZATION  02/2010   [facility]  .  CARDIAC CATHETERIZATION  02/21/2014   [facility]  . CLAVICLE SURGERY    . HERNIA REPAIR    . IRRIGATION AND DEBRIDEMENT FOOT Right 01/04/2018   Procedure: IRRIGATION AND DEBRIDEMENT FOOT;  Surgeon: Samara Deist, DPM;  Location: [facility] ORS;  Service: Podiatry;  Laterality: Right;  . LEFT HEART CATH AND CORONARY ANGIOGRAPHY N/A 01/20/2020   Procedure: LEFT HEART CATH AND CORONARY ANGIOGRAPHY;  Surgeon: Wellington Hampshire, MD;  Location: [place] CV LAB;  Service: Cardiovascular;  Laterality: N/A;  . LEFT HEART CATH AND CORS/GRAFTS ANGIOGRAPHY N/A 04/28/2017   Procedure: LEFT HEART CATH AND CORONARY ANGIOGRAPHY;  Surgeon: Wellington Hampshire, MD;  Location: [facility]  INVASIVE CV LAB;  Service: Cardiovascular;  Laterality: N/A;  . LOWER EXTREMITY ANGIOGRAPHY Right 03/03/2018   Procedure: LOWER EXTREMITY ANGIOGRAPHY;  Surgeon: Katha Cabal, MD;  Location: [place] CV LAB;  Service: Cardiovascular;  Laterality: Right;  . LOWER EXTREMITY ANGIOGRAPHY Left 08/24/2018   Procedure: LOWER EXTREMITY ANGIOGRAPHY;  Surgeon: Algernon Huxley, MD;  Location: [place] CV LAB;  Service: Cardiovascular;  Laterality: Left;  . LOWER EXTREMITY ANGIOGRAPHY Left 06/14/2019   Procedure: LOWER EXTREMITY ANGIOGRAPHY;  Surgeon: Algernon Huxley, MD;  Location: [place] CV LAB;  Service: Cardiovascular;  Laterality: Left;  . LOWER EXTREMITY ANGIOGRAPHY Left 08/02/2019   Procedure: LOWER EXTREMITY ANGIOGRAPHY;  Surgeon: Algernon Huxley, MD;  Location: [place] CV LAB;  Service: Cardiovascular;  Laterality: Left;  . PERIPHERAL ARTERIAL STENT GRAFT     x2 left/right  . PERIPHERAL VASCULAR BALLOON ANGIOPLASTY Right 01/06/2018   Procedure: PERIPHERAL VASCULAR BALLOON ANGIOPLASTY;  Surgeon: Katha Cabal, MD;  Location: [place] CV LAB;  Service: Cardiovascular;  Laterality: Right;  . PERIPHERAL VASCULAR CATHETERIZATION N/A 07/06/2015   Procedure: Abdominal Aortogram w/Lower Extremity;  Surgeon: Algernon Huxley, MD;  Location:  [place] CV LAB;  Service: Cardiovascular;  Laterality: N/A;  . PERIPHERAL VASCULAR CATHETERIZATION  07/06/2015   Procedure: Lower Extremity Intervention;  Surgeon: Algernon Huxley, MD;  Location: [place] CV LAB;  Service: Cardiovascular;;    Social History Social History   Tobacco Use  . Smoking status: Current Every Day Smoker    Packs/day: 1.00    Years: 41.00    Pack years: 41.00    Types: Cigarettes  . Smokeless tobacco: Never Used  Vaping Use  . Vaping Use: Never used  Substance Use Topics  . Alcohol use: No  . Drug use: No    Family History Family History  Problem Relation Age of Onset  . Heart attack Father   . Heart disease Father   . Hypertension Father   . Hyperlipidemia Father   . Hypertension Mother   . Hyperlipidemia Mother     Allergies  Allergen Reactions  . Dulaglutide Anaphylaxis, Diarrhea and Hives  . Other Itching    Skin itching associated with nitro patch  . Prednisone Rash     REVIEW OF SYSTEMS (Negative unless checked)  Constitutional: [] Weight loss  [] Fever  [] Chills Cardiac: [] Chest pain   [] Chest pressure   [] Palpitations   [] Shortness of breath when laying flat   [] Shortness of breath with exertion. Vascular:  [x] Pain in legs with walking   [x] Pain in legs at rest  [] History of DVT   [] Phlebitis   [x] Swelling in legs   [] Varicose veins   [x] Non-healing ulcers Pulmonary:   [] Uses home oxygen   [] Productive cough   [] Hemoptysis   [] Wheeze  [] COPD   [] Asthma Neurologic:  [] Dizziness   [] Seizures   [] History of stroke   [] History of TIA  [] Aphasia   [] Vissual changes   [] Weakness or numbness in arm   [] Weakness or numbness in leg Musculoskeletal:   [] Joint swelling   [x] Joint pain   [] Low back pain Hematologic:  [] Easy bruising  [] Easy bleeding   [] Hypercoagulable state   [] Anemic Gastrointestinal:  [] Diarrhea   [] Vomiting  [] Gastroesophageal reflux/heartburn   [] Difficulty swallowing. Genitourinary:  [] Chronic kidney disease    [] Difficult urination  [] Frequent urination   [] Blood in urine Skin:  [x] Rashes   [x] Ulcers  Psychological:  [] History of anxiety   []  History of major depression.  Physical Examination  Vitals:   02/10/20 1521  BP: 116/77  Pulse: 89  Resp: 16  Weight: 209 lb 12.8 oz (95.2 kg)   Body mass index is 32.86 kg/m. Gen: WD/WN, NAD Head: [place]/AT, No temporalis wasting.  Ear/Nose/Throat: Hearing grossly intact, nares w/o erythema or drainage Eyes: PER, EOMI, sclera nonicteric.  Neck: Supple, no large masses.   Pulmonary:  Good air movement, no audible wheezing bilaterally, no use of accessory muscles.  Cardiac: RRR, no JVD Vascular: Right BKA; left leg with multiple ulcers and marked erythremia  Vessel Right Left  Radial Palpable Palpable  PT BKA Not Palpable  DP BKA Trace Palpable  Gastrointestinal: Non-distended. No guarding/no peritoneal signs.  Musculoskeletal: M/S 5/5 throughout.  No deformity or atrophy.  Neurologic: CN 2-12 intact. Symmetrical.  Speech is fluent. Motor exam as listed above. Psychiatric: Judgment intact, Mood & affect appropriate for pt's clinical situation. Dermatologic: + rashes with ulcers noted.  + changes consistent with cellulitis. Lymph : mild lichenification and skin changes of chronic lymphedema.  CBC Lab Results  Component Value Date   WBC 4.7 01/20/2020   HGB 10.6 (L) 01/20/2020   HCT 32.0 (L) 01/20/2020   MCV 86.0 01/20/2020   PLT 153 01/20/2020    BMET    Component Value Date/Time   NA 132 (L) 01/20/2020 0352   NA 134 11/03/2019 1406   NA 136 06/08/2014 1037   K 3.5 01/20/2020 0352   K 4.3 06/08/2014 1037   CL 90 (L) 01/20/2020 0352   CL 101 06/08/2014 1037   CO2 33 (H) 01/20/2020 0352   CO2 27 06/08/2014 1037   GLUCOSE 120 (H) 01/20/2020 0352   GLUCOSE 415 (H) 06/08/2014 1037   BUN 21 (H) 01/20/2020 0352   BUN 25 (H) 11/03/2019 1406   BUN 9 06/08/2014 1037   CREATININE 1.23 01/20/2020 0352   CREATININE 0.87 02/05/2018 1417    CALCIUM 8.4 (L) 01/20/2020 0352   CALCIUM 9.2 06/08/2014 1037   GFRNONAA >60 01/20/2020 0352   GFRNONAA >60 06/08/2014 1037   GFRNONAA >60 02/17/2014 0909   GFRAA >60 01/20/2020 0352   GFRAA >60 06/08/2014 1037   GFRAA >60 02/17/2014 0909   CrCl cannot be calculated (Patient's most recent lab result is older than the maximum 21 days allowed.).  COAG Lab Results  Component Value Date   INR 1.2 01/19/2020   INR 1.13 04/01/2018   INR 1.25 03/30/2018    Radiology DG Chest 2 View  Result Date: 01/19/2020 CLINICAL DATA:  Chest pain EXAM: CHEST - 2 VIEW COMPARISON:  September 25, 2009 FINDINGS: The cardiomediastinal silhouette is unchanged in contour.Atherosclerotic calcifications of the aorta. No pleural effusion. No pneumothorax. No acute pleuroparenchymal abnormality. Visualized abdomen is unremarkable. Status post RIGHT shoulder surgery. Multilevel degenerative changes of the thoracic spine. IMPRESSION: No acute cardiopulmonary abnormality. Electronically Signed   By: Valentino Saxon MD   On: 01/19/2020 10:14   CARDIAC CATHETERIZATION  Result Date: 01/20/2020  Prox RCA-1 lesion is 80% stenosed.  Prox RCA-2 lesion is 20% stenosed.  Prox RCA to Mid RCA lesion is 80% stenosed.  Prox LAD lesion is 20% stenosed.  1st Diag lesion is 80% stenosed.  Mid LAD lesion is 20% stenosed.  Dist LAD lesion is 60% stenosed.  Prox Cx to Mid Cx lesion is 90% stenosed.  LPAV lesion is 95% stenosed.  1. Significant underlying three-vessel coronary artery disease. Patent proximal LAD stent with mild in-stent restenosis. Moderate diffuse disease in the distal LAD. Significant stenosis in the  proximal left circumflex at the origin of the posterior AV groove artery which is also heavily diseased at the ostium. This is a bifurcation lesion and heavily calcified. RCA stent is patent. However, there is significant proximal disease in the whole mid to distal segment is diffusely diseased and small caliber. 2. Left  ventricular angiography was not performed. EF was severe reduced by echo. 3. Moderately elevated left ventricular end-diastolic pressure at 28 mmHg Recommendations: The patient's cardiomyopathy seems to be out of proportion to his coronary artery disease as the LAD itself does not seem to have obstructive disease at the present time. The RCA is diffusely diseased and too small to stent. The only target for revascularization would be the left circumflex but it is a calcified bifurcation lesion and likely requires atherectomy. In order to do this safely, the patient has to be optimized from a heart failure standpoint as he appears to be volume overloaded. Recommend intravenous diuresis. If limited by hypotension, we might need to consider inotropic support and small dose norepinephrine drip initially. I added small dose digoxin. Treat underlying atrial fibrillation. Resume heparin drip 8 hours after sheath pull.   ECHOCARDIOGRAM COMPLETE  Result Date: 01/20/2020    ECHOCARDIOGRAM REPORT   Patient Name:   ISAIHA ASARE Date of Exam: 01/20/2020 Medical Rec #:  [id number]      Height:       67.0 in Accession #:    [id number]     Weight:       200.2 lb Date of Birth:  15-Sep-1960       BSA:          2.023 m Patient Age:    89 years       BP:           88/59 mmHg Patient Gender: M              HR:           61 bpm. Exam Location:  [facility] Procedure: 2D Echo, Color Doppler, Cardiac Doppler and Intracardiac            Opacification Agent Indications:     I21.4 NSTEMI  History:         Patient has prior history of Echocardiogram examinations, most                  recent 09/28/2019. CAD, COPD and PAD, Arrythmias:RBBB and Atrial                  Fibrillation; Risk Factors:Current Smoker, Sleep Apnea,                  Hypertension, Diabetes and Dyslipidemia.  Sonographer:     Charmayne Sheer RDCS (AE) Referring Phys:  [id number] Soledad Gerlach NIU Diagnosing Phys: Ida Rogue MD  Sonographer Comments: Suboptimal apical window. IMPRESSIONS  1. Left  ventricular ejection fraction, by estimation, is <20%. The left ventricle has severely decreased function. The left ventricle demonstrates global hypokinesis. The left ventricular internal cavity size was moderately dilated. Left ventricular diastolic parameters are indeterminate.  2. Right ventricular systolic function is moderately reduced. The right ventricular size is moderately enlarged. There is normal pulmonary artery systolic pressure. The estimated right ventricular systolic pressure is 51.8 mmHg.  3. Left atrial size was mildly dilated.  4. Mild mitral valve regurgitation.  5. Tricuspid valve regurgitation is moderate. FINDINGS  Left Ventricle: Left ventricular ejection fraction, by estimation, is <20%. The left ventricle has severely decreased function. The  left ventricle demonstrates global hypokinesis. Definity contrast agent was given IV to delineate the left ventricular endocardial borders. The left ventricular internal cavity size was moderately dilated. There is no left ventricular hypertrophy. Left ventricular diastolic parameters are indeterminate. Right Ventricle: The right ventricular size is moderately enlarged. No increase in right ventricular wall thickness. Right ventricular systolic function is moderately reduced. There is normal pulmonary artery systolic pressure. The tricuspid regurgitant velocity is 2.17 m/s, and with an assumed right atrial pressure of 10 mmHg, the estimated right ventricular systolic pressure is 80.9 mmHg. Left Atrium: Left atrial size was mildly dilated. Right Atrium: Right atrial size was normal in size. Pericardium: There is no evidence of pericardial effusion. Mitral Valve: The mitral valve is normal in structure. Normal mobility of the mitral valve leaflets. Mild mitral valve regurgitation. No evidence of mitral valve stenosis. MV peak gradient, 4.2 mmHg. The mean mitral valve gradient is 2.0 mmHg. Tricuspid Valve: The tricuspid valve is normal in structure.  Tricuspid valve regurgitation is moderate . No evidence of tricuspid stenosis. Aortic Valve: The aortic valve was not well visualized. Aortic valve regurgitation is not visualized. Mild to moderate aortic valve sclerosis/calcification is present, without any evidence of aortic stenosis. Aortic valve mean gradient measures 4.0 mmHg.  Aortic valve peak gradient measures 6.7 mmHg. Aortic valve area, by VTI measures 2.06 cm. Pulmonic Valve: The pulmonic valve was normal in structure. Pulmonic valve regurgitation is not visualized. No evidence of pulmonic stenosis. Aorta: The aortic root is normal in size and structure. Venous: The inferior vena cava is normal in size with greater than 50% respiratory variability, suggesting right atrial pressure of 3 mmHg. IAS/Shunts: No atrial level shunt detected by color flow Doppler.  LEFT VENTRICLE PLAX 2D LVIDd:         6.38 cm      Diastology LVIDs:         6.04 cm      LV e' lateral:   5.33 cm/s LV PW:         1.16 cm      LV E/e' lateral: 17.4 LV IVS:        1.03 cm      LV e' medial:    3.05 cm/s LVOT diam:     2.60 cm      LV E/e' medial:  30.3 LV SV:         48 LV SV Index:   24 LVOT Area:     5.31 cm  LV Volumes (MOD) LV vol d, MOD A2C: 240.0 ml LV vol d, MOD A4C: 251.0 ml LV vol s, MOD A2C: 224.0 ml LV vol s, MOD A4C: 214.0 ml LV SV MOD A2C:     16.0 ml LV SV MOD A4C:     251.0 ml LV SV MOD BP:      22.5 ml RIGHT VENTRICLE RV Basal diam:  4.32 cm LEFT ATRIUM             Index       RIGHT ATRIUM           Index LA diam:        4.70 cm 2.32 cm/m  RA Area:     20.50 cm LA Vol (A2C):   61.4 ml 30.35 ml/m RA Volume:   70.50 ml  34.85 ml/m LA Vol (A4C):   74.5 ml 36.82 ml/m LA Biplane Vol: 69.9 ml 34.55 ml/m  AORTIC VALVE  PULMONIC VALVE AV Area (Vmax):    2.18 cm     PV Vmax:       0.75 m/s AV Area (Vmean):   1.87 cm     PV Vmean:      54.800 cm/s AV Area (VTI):     2.06 cm     PV VTI:        0.120 m AV Vmax:           129.00 cm/s  PV Peak grad:   2.2 mmHg AV Vmean:          101.000 cm/s PV Mean grad:  1.0 mmHg AV VTI:            0.233 m AV Peak Grad:      6.7 mmHg AV Mean Grad:      4.0 mmHg LVOT Vmax:         52.90 cm/s LVOT Vmean:        35.500 cm/s LVOT VTI:          0.090 m LVOT/AV VTI ratio: 0.39  AORTA Ao Root diam: 3.00 cm MITRAL VALVE               TRICUSPID VALVE MV Area (PHT): 5.06 cm    TR Peak grad:   18.8 mmHg MV Peak grad:  4.2 mmHg    TR Vmax:        217.00 cm/s MV Mean grad:  2.0 mmHg MV Vmax:       1.03 m/s    SHUNTS MV Vmean:      59.4 cm/s   Systemic VTI:  0.09 m MV Decel Time: 150 msec    Systemic Diam: 2.60 cm MV E velocity: 92.50 cm/s Ida Rogue MD Electronically signed by Ida Rogue MD Signature Date/Time: 01/20/2020/6:20:54 PM    Final       Assessment/Plan 1. Cellulitis of left lower leg I will start Keflex 500 3 times daily we will also place him in an [place] boot given his triphasic anterior tibial signal  2. Mixed hyperlipidemia Continue statin as ordered and reviewed, no changes at this time  3. Atherosclerosis of native arteries of the extremities with ulceration ([place])  Recommend:  The patient has evidence of atherosclerosis of the lower extremities with claudication.  The patient has triphasic Doppler signals and a normal ABI on today's noninvasive studies.  These noninvasive studies do not suggest clinically significant change.  Given this finding I do not believe angiography is indicated at this time.  His wounds appear to be more venous based however his toe tracings on his noninvasives are flat and he does have several small scab-like wounds on his toes.  Consequently, angiography in the future may be required.  I would prefer to try to control his edema and his cellulitis before moving forward with arterial and intervention.  No invasive studies, angiography or surgery at this time The patient should continue walking and begin a more formal exercise program.  The patient should continue  antiplatelet therapy and aggressive treatment of the lipid abnormalities  No changes in the patient's medications at this time  The patient should continue wearing graduated compression socks 10-15 mmHg strength to control the mild edema.    4. NSTEMI (non-ST elevated myocardial infarction) ([place]) Continue cardiac and antihypertensive medications as already ordered and reviewed, no changes at this time.  Continue statin as ordered and reviewed, no changes at this time  Nitrates PRN for chest pain   5. Essential  hypertension Continue antihypertensive medications as already ordered, these medications have been reviewed and there are no changes at this time.   6. Chronic venous insufficiency We will place him in an [place], MD  02/10/2020 3:28 PM

## 2020-02-11 ENCOUNTER — Encounter (INDEPENDENT_AMBULATORY_CARE_PROVIDER_SITE_OTHER): Payer: Self-pay | Admitting: Vascular Surgery

## 2020-02-16 NOTE — Progress Notes (Signed)
MRN : [id number]  [name] [name] is a 59 y.o. (1961-02-14) male who presents with chief complaint of No chief complaint on file. [name]  History of Present Illness:   The patient returns to the office for followup and review of the noninvasive studies. There has been a significant recent deterioration in the lower extremity symptoms. The patient notes interval shortening of their claudication distance and development of mild rest pain symptoms. There have been several new ulcers that have occurred since the last visit.  Also his edema is not improved  There have been no significant changes to the patient's overall health care.   No outpatient medications have been marked as taking for the 02/17/20 encounter (Appointment) with Delana Meyer, Dolores Lory, MD.    Past Medical History:  Diagnosis Date  . Arthritis   . Asthma   . Atherosclerosis   . BPH (benign prostatic hyperplasia)   . Carotid arterial disease ([place])   . Charcot's joint of foot, right   . Chronic combined systolic (congestive) and diastolic (congestive) heart failure ([place])    a. 02/2013 EF 50% by LV gram; b. 04/2017 Echo: EF 25-30%. diff HK. Gr2 DD. Mod dil LA/RV. PASP 83mmHg.  [name] COPD (chronic obstructive pulmonary disease) ([place])   . Coronary artery disease    a. 2013 S/P PCI of LAD [facility]);  b. 03/2013 PCI: RCA 90p ( 2.5 x 23 mm DES); c. 02/2014 Cath: patent RCA stent->Med Rx; d. 04/2017 Cath: LM nl, LAd 30ost/p, 27m/d, D1 80ost, LCX 90p/m, OM1 85, RCA 70/20p, 64m, 70d->Referred for CT Surg-felt to be poor candidate.  . Diabetic neuropathy ([place])   . Diabetic ulcer of right foot ([place])   . Hernia   . Hyperlipidemia   . Hypertension   . Ischemic cardiomyopathy    a. 04/2017 Echo: EF 25-30%; b. 08/2017 Cardiac MRI ([facility]): EF 19%, sev glob HK. RVEF 20%, mod BAE, triv MR, mild-mod TR. Basal lateral subendocardial infarct (viable), inf/infsept, dist septal ischemia, basal to mid lat peri-infarct ischemia.  . Morbid obesity ([place])   .  Neuropathy   . PAD (peripheral artery disease) ([place])    a. Followed by Dr. Lucky Cowboy; b. 08/2010 Periph Angio: RSFA 70-80p (6X60 self-expanding stent); c. 09/2010 Periph Angio: L SFA 100p (7x unknown length self-expanding stent); d. 06/2015 Periph Angio: R SFA short segment occlusion (6x12 self-expanding stent).  . Restless leg syndrome   . Sleep apnea   . Subclavian artery stenosis, right ([place])   . Syncope and collapse   . Tobacco use    a. 75+ yr hx - still smoking 1ppd, down from 2 ppd.  . Type II diabetes mellitus ([place])   . Varicose vein     Past Surgical History:  Procedure Laterality Date  . ABDOMINAL AORTAGRAM N/A 06/23/2013   Procedure: ABDOMINAL Maxcine Ham;  Surgeon: Wellington Hampshire, MD;  Location: [facility] CATH LAB;  Service: Cardiovascular;  Laterality: N/A;  . AMPUTATION Right 04/01/2018   Procedure: AMPUTATION BELOW KNEE;  Surgeon: Algernon Huxley, MD;  Location: [facility] ORS;  Service: Vascular;  Laterality: Right;  . APPENDECTOMY    . CARDIAC CATHETERIZATION  10/14   [facility]; X1 STENT PROXIMAL RCA  . CARDIAC CATHETERIZATION  02/2010   [facility]  . CARDIAC CATHETERIZATION  02/21/2014   [facility]  . CLAVICLE SURGERY    . HERNIA REPAIR    . IRRIGATION AND DEBRIDEMENT FOOT Right 01/04/2018   Procedure: IRRIGATION AND DEBRIDEMENT FOOT;  Surgeon: Samara Deist, DPM;  Location: [facility]  ORS;  Service: Podiatry;  Laterality: Right;  . LEFT HEART CATH AND CORONARY ANGIOGRAPHY N/A 01/20/2020   Procedure: LEFT HEART CATH AND CORONARY ANGIOGRAPHY;  Surgeon: Wellington Hampshire, MD;  Location: [place] CV LAB;  Service: Cardiovascular;  Laterality: N/A;  . LEFT HEART CATH AND CORS/GRAFTS ANGIOGRAPHY N/A 04/28/2017   Procedure: LEFT HEART CATH AND CORONARY ANGIOGRAPHY;  Surgeon: Wellington Hampshire, MD;  Location: [place] CV LAB;  Service: Cardiovascular;  Laterality: N/A;  . LOWER EXTREMITY ANGIOGRAPHY Right 03/03/2018   Procedure: LOWER EXTREMITY ANGIOGRAPHY;  Surgeon: Katha Cabal, MD;  Location: [place] CV LAB;  Service: Cardiovascular;  Laterality: Right;  . LOWER EXTREMITY ANGIOGRAPHY Left 08/24/2018   Procedure: LOWER EXTREMITY ANGIOGRAPHY;  Surgeon: Algernon Huxley, MD;  Location: [place] CV LAB;  Service: Cardiovascular;  Laterality: Left;  . LOWER EXTREMITY ANGIOGRAPHY Left 06/14/2019   Procedure: LOWER EXTREMITY ANGIOGRAPHY;  Surgeon: Algernon Huxley, MD;  Location: [place] CV LAB;  Service: Cardiovascular;  Laterality: Left;  . LOWER EXTREMITY ANGIOGRAPHY Left 08/02/2019   Procedure: LOWER EXTREMITY ANGIOGRAPHY;  Surgeon: Algernon Huxley, MD;  Location: [place] CV LAB;  Service: Cardiovascular;  Laterality: Left;  . PERIPHERAL ARTERIAL STENT GRAFT     x2 left/right  . PERIPHERAL VASCULAR BALLOON ANGIOPLASTY Right 01/06/2018   Procedure: PERIPHERAL VASCULAR BALLOON ANGIOPLASTY;  Surgeon: Katha Cabal, MD;  Location: [place] CV LAB;  Service: Cardiovascular;  Laterality: Right;  . PERIPHERAL VASCULAR CATHETERIZATION N/A 07/06/2015   Procedure: Abdominal Aortogram w/Lower Extremity;  Surgeon: Algernon Huxley, MD;  Location: [place] CV LAB;  Service: Cardiovascular;  Laterality: N/A;  . PERIPHERAL VASCULAR CATHETERIZATION  07/06/2015   Procedure: Lower Extremity Intervention;  Surgeon: Algernon Huxley, MD;  Location: [place] CV LAB;  Service: Cardiovascular;;    Social History Social History   Tobacco Use  . Smoking status: Current Every Day Smoker    Packs/day: 1.00    Years: 41.00    Pack years: 41.00    Types: Cigarettes  . Smokeless tobacco: Never Used  Vaping Use  . Vaping Use: Never used  Substance Use Topics  . Alcohol use: No  . Drug use: No    Family History Family History  Problem Relation Age of Onset  . Heart attack Father   . Heart disease Father   . Hypertension Father   . Hyperlipidemia Father   . Hypertension Mother   . Hyperlipidemia Mother     Allergies  Allergen Reactions  . Dulaglutide Anaphylaxis, Diarrhea and Hives    . Other Itching    Skin itching associated with nitro patch  . Prednisone Rash     REVIEW OF SYSTEMS (Negative unless checked)  Constitutional: [] Weight loss  [] Fever  [] Chills Cardiac: [] Chest pain   [] Chest pressure   [] Palpitations   [] Shortness of breath when laying flat   [] Shortness of breath with exertion. Vascular:  [] Pain in legs with walking   [] Pain in legs at rest  [] History of DVT   [] Phlebitis   [x] Swelling in legs   [] Varicose veins   [x] Non-healing ulcers Pulmonary:   [] Uses home oxygen   [] Productive cough   [] Hemoptysis   [] Wheeze  [] COPD   [] Asthma Neurologic:  [] Dizziness   [] Seizures   [] History of stroke   [] History of TIA  [] Aphasia   [] Vissual changes   [] Weakness or numbness in arm   [] Weakness or numbness in leg Musculoskeletal:   [] Joint swelling   [x] Joint  pain   [] Low back pain Hematologic:  [] Easy bruising  [] Easy bleeding   [] Hypercoagulable state   [] Anemic Gastrointestinal:  [] Diarrhea   [] Vomiting  [] Gastroesophageal reflux/heartburn   [] Difficulty swallowing. Genitourinary:  [] Chronic kidney disease   [] Difficult urination  [] Frequent urination   [] Blood in urine Skin:  [x] Rashes   [x] Ulcers  Psychological:  [] History of anxiety   []  History of major depression.  Physical Examination  There were no vitals filed for this visit. There is no height or weight on file to calculate BMI. Gen: WD/WN, NAD Head: [place]/AT, No temporalis wasting.  Ear/Nose/Throat: Hearing grossly intact, nares w/o erythema or drainage Eyes: PER, EOMI, sclera nonicteric.  Neck: Supple, no large masses.   Pulmonary:  Good air movement, no audible wheezing bilaterally, no use of accessory muscles.  Cardiac: RRR, no JVD Vascular: Vessel Right Left  Radial Palpable Palpable  PT BKA Not Palpable  DP BKA Not Palpable  Gastrointestinal: Non-distended. No guarding/no peritoneal signs.  Musculoskeletal: M/S 5/5 throughout.  No deformity or atrophy.  Neurologic: CN 2-12 intact.  Symmetrical.  Speech is fluent. Motor exam as listed above. Psychiatric: Judgment intact, Mood & affect appropriate for pt's clinical situation. Dermatologic: venous rashes no ulcers noted.  No changes consistent with cellulitis. Lymph : + lichenification and skin changes of chronic lymphedema.  CBC Lab Results  Component Value Date   WBC 4.7 01/20/2020   HGB 10.6 (L) 01/20/2020   HCT 32.0 (L) 01/20/2020   MCV 86.0 01/20/2020   PLT 153 01/20/2020    BMET    Component Value Date/Time   NA 132 (L) 01/20/2020 0352   NA 134 11/03/2019 1406   NA 136 06/08/2014 1037   K 3.5 01/20/2020 0352   K 4.3 06/08/2014 1037   CL 90 (L) 01/20/2020 0352   CL 101 06/08/2014 1037   CO2 33 (H) 01/20/2020 0352   CO2 27 06/08/2014 1037   GLUCOSE 120 (H) 01/20/2020 0352   GLUCOSE 415 (H) 06/08/2014 1037   BUN 21 (H) 01/20/2020 0352   BUN 25 (H) 11/03/2019 1406   BUN 9 06/08/2014 1037   CREATININE 1.23 01/20/2020 0352   CREATININE 0.87 02/05/2018 1417   CALCIUM 8.4 (L) 01/20/2020 0352   CALCIUM 9.2 06/08/2014 1037   GFRNONAA >60 01/20/2020 0352   GFRNONAA >60 06/08/2014 1037   GFRNONAA >60 02/17/2014 0909   GFRAA >60 01/20/2020 0352   GFRAA >60 06/08/2014 1037   GFRAA >60 02/17/2014 0909   CrCl cannot be calculated (Patient's most recent lab result is older than the maximum 21 days allowed.).  COAG Lab Results  Component Value Date   INR 1.2 01/19/2020   INR 1.13 04/01/2018   INR 1.25 03/30/2018    Radiology DG Chest 2 View  Result Date: 01/19/2020 CLINICAL DATA:  Chest pain EXAM: CHEST - 2 VIEW COMPARISON:  September 25, 2009 FINDINGS: The cardiomediastinal silhouette is unchanged in contour.Atherosclerotic calcifications of the aorta. No pleural effusion. No pneumothorax. No acute pleuroparenchymal abnormality. Visualized abdomen is unremarkable. Status post RIGHT shoulder surgery. Multilevel degenerative changes of the thoracic spine. IMPRESSION: No acute cardiopulmonary abnormality.  Electronically Signed   By: Valentino Saxon MD   On: 01/19/2020 10:14   CARDIAC CATHETERIZATION  Result Date: 01/20/2020  Prox RCA-1 lesion is 80% stenosed.  Prox RCA-2 lesion is 20% stenosed.  Prox RCA to Mid RCA lesion is 80% stenosed.  Prox LAD lesion is 20% stenosed.  1st Diag lesion is 80% stenosed.  Mid LAD lesion is  20% stenosed.  Dist LAD lesion is 60% stenosed.  Prox Cx to Mid Cx lesion is 90% stenosed.  LPAV lesion is 95% stenosed.  1. Significant underlying three-vessel coronary artery disease. Patent proximal LAD stent with mild in-stent restenosis. Moderate diffuse disease in the distal LAD. Significant stenosis in the proximal left circumflex at the origin of the posterior AV groove artery which is also heavily diseased at the ostium. This is a bifurcation lesion and heavily calcified. RCA stent is patent. However, there is significant proximal disease in the whole mid to distal segment is diffusely diseased and small caliber. 2. Left ventricular angiography was not performed. EF was severe reduced by echo. 3. Moderately elevated left ventricular end-diastolic pressure at 28 mmHg Recommendations: The patient's cardiomyopathy seems to be out of proportion to his coronary artery disease as the LAD itself does not seem to have obstructive disease at the present time. The RCA is diffusely diseased and too small to stent. The only target for revascularization would be the left circumflex but it is a calcified bifurcation lesion and likely requires atherectomy. In order to do this safely, the patient has to be optimized from a heart failure standpoint as he appears to be volume overloaded. Recommend intravenous diuresis. If limited by hypotension, we might need to consider inotropic support and small dose norepinephrine drip initially. I added small dose digoxin. Treat underlying atrial fibrillation. Resume heparin drip 8 hours after sheath pull.   ECHOCARDIOGRAM COMPLETE  Result Date:  01/20/2020    ECHOCARDIOGRAM REPORT   Patient Name:   [name] [name] Date of Exam: 01/20/2020 Medical Rec #:  [id number]      Height:       67.0 in Accession #:    [id number]     Weight:       200.2 lb Date of Birth:  05-May-1961       BSA:          2.023 m Patient Age:    63 years       BP:           88/59 mmHg Patient Gender: M              HR:           61 bpm. Exam Location:  [facility] Procedure: 2D Echo, Color Doppler, Cardiac Doppler and Intracardiac            Opacification Agent Indications:     I21.4 NSTEMI  History:         Patient has prior history of Echocardiogram examinations, most                  recent 09/28/2019. CAD, COPD and PAD, Arrythmias:RBBB and Atrial                  Fibrillation; Risk Factors:Current Smoker, Sleep Apnea,                  Hypertension, Diabetes and Dyslipidemia.  Sonographer:     Charmayne Sheer RDCS (AE) Referring Phys:  [id number] Soledad Gerlach NIU Diagnosing Phys: Ida Rogue MD  Sonographer Comments: Suboptimal apical window. IMPRESSIONS  1. Left ventricular ejection fraction, by estimation, is <20%. The left ventricle has severely decreased function. The left ventricle demonstrates global hypokinesis. The left ventricular internal cavity size was moderately dilated. Left ventricular diastolic parameters are indeterminate.  2. Right ventricular systolic function is moderately reduced. The right ventricular size is moderately enlarged. There is normal pulmonary artery systolic  pressure. The estimated right ventricular systolic pressure is 16.1 mmHg.  3. Left atrial size was mildly dilated.  4. Mild mitral valve regurgitation.  5. Tricuspid valve regurgitation is moderate. FINDINGS  Left Ventricle: Left ventricular ejection fraction, by estimation, is <20%. The left ventricle has severely decreased function. The left ventricle demonstrates global hypokinesis. Definity contrast agent was given IV to delineate the left ventricular endocardial borders. The left ventricular internal cavity size was  moderately dilated. There is no left ventricular hypertrophy. Left ventricular diastolic parameters are indeterminate. Right Ventricle: The right ventricular size is moderately enlarged. No increase in right ventricular wall thickness. Right ventricular systolic function is moderately reduced. There is normal pulmonary artery systolic pressure. The tricuspid regurgitant velocity is 2.17 m/s, and with an assumed right atrial pressure of 10 mmHg, the estimated right ventricular systolic pressure is 09.6 mmHg. Left Atrium: Left atrial size was mildly dilated. Right Atrium: Right atrial size was normal in size. Pericardium: There is no evidence of pericardial effusion. Mitral Valve: The mitral valve is normal in structure. Normal mobility of the mitral valve leaflets. Mild mitral valve regurgitation. No evidence of mitral valve stenosis. MV peak gradient, 4.2 mmHg. The mean mitral valve gradient is 2.0 mmHg. Tricuspid Valve: The tricuspid valve is normal in structure. Tricuspid valve regurgitation is moderate . No evidence of tricuspid stenosis. Aortic Valve: The aortic valve was not well visualized. Aortic valve regurgitation is not visualized. Mild to moderate aortic valve sclerosis/calcification is present, without any evidence of aortic stenosis. Aortic valve mean gradient measures 4.0 mmHg.  Aortic valve peak gradient measures 6.7 mmHg. Aortic valve area, by VTI measures 2.06 cm. Pulmonic Valve: The pulmonic valve was normal in structure. Pulmonic valve regurgitation is not visualized. No evidence of pulmonic stenosis. Aorta: The aortic root is normal in size and structure. Venous: The inferior vena cava is normal in size with greater than 50% respiratory variability, suggesting right atrial pressure of 3 mmHg. IAS/Shunts: No atrial level shunt detected by color flow Doppler.  LEFT VENTRICLE PLAX 2D LVIDd:         6.38 cm      Diastology LVIDs:         6.04 cm      LV e' lateral:   5.33 cm/s LV PW:         1.16 cm       LV E/e' lateral: 17.4 LV IVS:        1.03 cm      LV e' medial:    3.05 cm/s LVOT diam:     2.60 cm      LV E/e' medial:  30.3 LV SV:         48 LV SV Index:   24 LVOT Area:     5.31 cm  LV Volumes (MOD) LV vol d, MOD A2C: 240.0 ml LV vol d, MOD A4C: 251.0 ml LV vol s, MOD A2C: 224.0 ml LV vol s, MOD A4C: 214.0 ml LV SV MOD A2C:     16.0 ml LV SV MOD A4C:     251.0 ml LV SV MOD BP:      22.5 ml RIGHT VENTRICLE RV Basal diam:  4.32 cm LEFT ATRIUM             Index       RIGHT ATRIUM           Index LA diam:        4.70 cm 2.32 cm/m  RA Area:  20.50 cm LA Vol (A2C):   61.4 ml 30.35 ml/m RA Volume:   70.50 ml  34.85 ml/m LA Vol (A4C):   74.5 ml 36.82 ml/m LA Biplane Vol: 69.9 ml 34.55 ml/m  AORTIC VALVE                    PULMONIC VALVE AV Area (Vmax):    2.18 cm     PV Vmax:       0.75 m/s AV Area (Vmean):   1.87 cm     PV Vmean:      54.800 cm/s AV Area (VTI):     2.06 cm     PV VTI:        0.120 m AV Vmax:           129.00 cm/s  PV Peak grad:  2.2 mmHg AV Vmean:          101.000 cm/s PV Mean grad:  1.0 mmHg AV VTI:            0.233 m AV Peak Grad:      6.7 mmHg AV Mean Grad:      4.0 mmHg LVOT Vmax:         52.90 cm/s LVOT Vmean:        35.500 cm/s LVOT VTI:          0.090 m LVOT/AV VTI ratio: 0.39  AORTA Ao Root diam: 3.00 cm MITRAL VALVE               TRICUSPID VALVE MV Area (PHT): 5.06 cm    TR Peak grad:   18.8 mmHg MV Peak grad:  4.2 mmHg    TR Vmax:        217.00 cm/s MV Mean grad:  2.0 mmHg MV Vmax:       1.03 m/s    SHUNTS MV Vmean:      59.4 cm/s   Systemic VTI:  0.09 m MV Decel Time: 150 msec    Systemic Diam: 2.60 cm MV E velocity: 92.50 cm/s Ida Rogue MD Electronically signed by Ida Rogue MD Signature Date/Time: 01/20/2020/6:20:54 PM    Final      Assessment/Plan 1. Venous ulcer of left leg (HCC) No surgery or intervention at this point in time.    I have had a long discussion with the patient regarding venous insufficiency and why it  causes symptoms, specifically  venous ulceration . I have discussed with the patient the chronic skin changes that accompany venous insufficiency and the long term sequela such as infection and recurring  ulceration.  Patient will be placed in [employer] which will be changed weekly drainage permitting.  In addition, behavioral modification including several periods of elevation of the lower extremities during the day will be continued. Achieving a position with the ankles at heart level was stressed to the patient  The patient is instructed to begin routine exercise, especially walking on a daily basis  Following the review of the ultrasound the patient will follow up in one week to reassess the degree of swelling and the control that [place] therapy is offering.   The patient can be assessed for graduated compression stockings or wraps as well as a Lymph Pump once the ulcers are healed.   2. Atherosclerosis of native arteries of the extremities with ulceration ([place]) Recommend:  The patient has evidence of atherosclerosis of the lower extremities with claudication.  The patient has triphasic Doppler signals and a normal ABI  on today's noninvasive studies.  These noninvasive studies do not suggest clinically significant change.  Given this finding I do not believe angiography is indicated at this time.  His wounds appear to be more venous based however his toe tracings on his noninvasives are flat and he does have several small scab-like wounds on his toes.  Consequently, angiography in the future may be required.  I would prefer to try to control his edema and his cellulitis before moving forward with arterial and intervention.  No invasive studies, angiography or surgery at this time The patient should continue walking and begin a more formal exercise program.  The patient should continue antiplatelet therapy and aggressive treatment of the lipid abnormalities  No changes in the patient's medications at this time  3.  Chronic venous insufficiency See #1  4. Coronary artery disease of native artery of native heart with stable angina pectoris (HCC) Continue cardiac and antihypertensive medications as already ordered and reviewed, no changes at this time.  Continue statin as ordered and reviewed, no changes at this time  Nitrates PRN for chest pain   5. Centrilobular emphysema (HCC) Continue pulmonary medications and aerosols as already ordered, these medications have been reviewed and there are no changes at this time.     Hortencia Pilar, MD  02/16/2020 6:22 PM

## 2020-02-17 ENCOUNTER — Other Ambulatory Visit: Payer: Self-pay

## 2020-02-17 ENCOUNTER — Ambulatory Visit (INDEPENDENT_AMBULATORY_CARE_PROVIDER_SITE_OTHER): Payer: Medicare Other | Admitting: Vascular Surgery

## 2020-02-17 ENCOUNTER — Encounter (INDEPENDENT_AMBULATORY_CARE_PROVIDER_SITE_OTHER): Payer: Self-pay | Admitting: Vascular Surgery

## 2020-02-17 VITALS — BP 123/73 | HR 102 | Resp 16 | Wt 212.0 lb

## 2020-02-17 DIAGNOSIS — I25118 Atherosclerotic heart disease of native coronary artery with other forms of angina pectoris: Secondary | ICD-10-CM

## 2020-02-17 DIAGNOSIS — I7025 Atherosclerosis of native arteries of other extremities with ulceration: Secondary | ICD-10-CM | POA: Diagnosis not present

## 2020-02-17 DIAGNOSIS — I872 Venous insufficiency (chronic) (peripheral): Secondary | ICD-10-CM | POA: Diagnosis not present

## 2020-02-17 DIAGNOSIS — J432 Centrilobular emphysema: Secondary | ICD-10-CM

## 2020-02-17 DIAGNOSIS — L97929 Non-pressure chronic ulcer of unspecified part of left lower leg with unspecified severity: Secondary | ICD-10-CM | POA: Diagnosis not present

## 2020-02-17 DIAGNOSIS — I83029 Varicose veins of left lower extremity with ulcer of unspecified site: Secondary | ICD-10-CM

## 2020-02-20 ENCOUNTER — Encounter (INDEPENDENT_AMBULATORY_CARE_PROVIDER_SITE_OTHER): Payer: Self-pay | Admitting: Vascular Surgery

## 2020-02-21 ENCOUNTER — Encounter (INDEPENDENT_AMBULATORY_CARE_PROVIDER_SITE_OTHER): Payer: Medicare Other

## 2020-02-21 ENCOUNTER — Other Ambulatory Visit (INDEPENDENT_AMBULATORY_CARE_PROVIDER_SITE_OTHER): Payer: Self-pay | Admitting: Vascular Surgery

## 2020-02-21 DIAGNOSIS — I83029 Varicose veins of left lower extremity with ulcer of unspecified site: Secondary | ICD-10-CM

## 2020-02-21 DIAGNOSIS — I872 Venous insufficiency (chronic) (peripheral): Secondary | ICD-10-CM

## 2020-02-21 DIAGNOSIS — L97929 Non-pressure chronic ulcer of unspecified part of left lower leg with unspecified severity: Secondary | ICD-10-CM

## 2020-02-24 ENCOUNTER — Encounter (INDEPENDENT_AMBULATORY_CARE_PROVIDER_SITE_OTHER): Payer: Self-pay | Admitting: Nurse Practitioner

## 2020-02-24 ENCOUNTER — Ambulatory Visit (INDEPENDENT_AMBULATORY_CARE_PROVIDER_SITE_OTHER): Payer: Medicare Other | Admitting: Nurse Practitioner

## 2020-02-24 ENCOUNTER — Other Ambulatory Visit: Payer: Self-pay

## 2020-02-24 ENCOUNTER — Ambulatory Visit (INDEPENDENT_AMBULATORY_CARE_PROVIDER_SITE_OTHER): Payer: Medicare Other | Admitting: Vascular Surgery

## 2020-02-24 ENCOUNTER — Ambulatory Visit (INDEPENDENT_AMBULATORY_CARE_PROVIDER_SITE_OTHER): Payer: Medicare Other

## 2020-02-24 VITALS — BP 107/72 | HR 117 | Resp 16 | Wt 210.6 lb

## 2020-02-24 DIAGNOSIS — I83029 Varicose veins of left lower extremity with ulcer of unspecified site: Secondary | ICD-10-CM

## 2020-02-24 DIAGNOSIS — I7025 Atherosclerosis of native arteries of other extremities with ulceration: Secondary | ICD-10-CM

## 2020-02-24 DIAGNOSIS — L97929 Non-pressure chronic ulcer of unspecified part of left lower leg with unspecified severity: Secondary | ICD-10-CM

## 2020-02-24 DIAGNOSIS — I872 Venous insufficiency (chronic) (peripheral): Secondary | ICD-10-CM

## 2020-02-24 DIAGNOSIS — I5020 Unspecified systolic (congestive) heart failure: Secondary | ICD-10-CM

## 2020-02-27 ENCOUNTER — Other Ambulatory Visit: Payer: Self-pay

## 2020-02-27 DIAGNOSIS — Z5321 Procedure and treatment not carried out due to patient leaving prior to being seen by health care provider: Secondary | ICD-10-CM | POA: Insufficient documentation

## 2020-02-27 DIAGNOSIS — M25511 Pain in right shoulder: Secondary | ICD-10-CM | POA: Insufficient documentation

## 2020-02-27 DIAGNOSIS — M25512 Pain in left shoulder: Secondary | ICD-10-CM | POA: Insufficient documentation

## 2020-02-27 NOTE — ED Triage Notes (Signed)
Patient reports bilateral shoulder pain for "awhile", right worse than left.  Reports had surgery and removed partial collar bone and having pain to right ever since.

## 2020-02-28 ENCOUNTER — Emergency Department
Admission: EM | Admit: 2020-02-28 | Discharge: 2020-02-28 | Disposition: A | Discharge status: Home / Self Care | Payer: Medicare Other | Attending: Emergency Medicine | Admitting: Emergency Medicine

## 2020-02-28 NOTE — ED Notes (Signed)
Pt states he is leaving and will follow up with his doctor in a few days. Pt encouraged to stay and see the doctor but did not wish to wait any longer.

## 2020-02-29 ENCOUNTER — Ambulatory Visit (INDEPENDENT_AMBULATORY_CARE_PROVIDER_SITE_OTHER): Payer: Medicare Other | Admitting: Cardiovascular Disease

## 2020-02-29 ENCOUNTER — Encounter: Payer: Self-pay | Admitting: Cardiovascular Disease

## 2020-02-29 ENCOUNTER — Other Ambulatory Visit: Payer: Self-pay

## 2020-02-29 VITALS — BP 106/70 | HR 72 | Ht 67.0 in | Wt 210.0 lb

## 2020-02-29 DIAGNOSIS — E1159 Type 2 diabetes mellitus with other circulatory complications: Secondary | ICD-10-CM | POA: Diagnosis not present

## 2020-02-29 DIAGNOSIS — I25118 Atherosclerotic heart disease of native coronary artery with other forms of angina pectoris: Secondary | ICD-10-CM | POA: Diagnosis not present

## 2020-02-29 DIAGNOSIS — I5022 Chronic systolic (congestive) heart failure: Secondary | ICD-10-CM

## 2020-02-29 DIAGNOSIS — E785 Hyperlipidemia, unspecified: Secondary | ICD-10-CM

## 2020-02-29 DIAGNOSIS — I7025 Atherosclerosis of native arteries of other extremities with ulceration: Secondary | ICD-10-CM | POA: Diagnosis not present

## 2020-02-29 DIAGNOSIS — Z794 Long term (current) use of insulin: Secondary | ICD-10-CM

## 2020-02-29 DIAGNOSIS — I739 Peripheral vascular disease, unspecified: Secondary | ICD-10-CM

## 2020-02-29 MED ORDER — AMIODARONE HCL 200 MG PO TABS: 400.0000 mg | ORAL_TABLET | Freq: Two times a day (BID) | ORAL | 1 refills | Status: DC

## 2020-02-29 MED ORDER — FUROSEMIDE 10 MG/ML IJ SOLN: 40.0000 mg | Freq: Once | INTRAMUSCULAR | 0 refills | Status: DC

## 2020-02-29 MED ORDER — FUROSEMIDE 40 MG PO TABS: 40.0000 mg | ORAL_TABLET | Freq: Two times a day (BID) | ORAL | 2 refills | Status: DC

## 2020-02-29 MED ORDER — FUROSEMIDE 10 MG/ML IJ SOLN
40.0000 mg | Freq: Once | INTRAMUSCULAR | Status: AC
  Administered 2020-02-29: 40 mg via INTRAVENOUS

## 2020-02-29 NOTE — Progress Notes (Signed)
Date:  02/29/2020   ID:  [name], DOB 02/22/1961, MRN [id number]  Patient Location:  [place] [address]   Provider location:   [facility], [place] office  PCP:  [name], MD  Cardiologist:  Arvid Right [facility]   Chief Complaint  Patient presents with  . office visit    6 month F/U-Patient reports SOB-Patientreports that he is currently being treated for L-sided cellulitis; Meds verbally reviewed with patient.    History of Present Illness:    [name] [name] is a 59 y.o. 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 and lower extremity ulceration,  PAD s/p prior SFA stenting 03/2013 s/p R BKA,  carotid arterial disease Who presents for follow-up of his acute on chronic heart failure , hospital discharge  In hospital 01/2020 NSTEMI, atrial fib Markedly elevated troponin Hypotensive, echocardiogram confirming ejection fraction less than 20% with global hypokinesis, Severe PAD Reports he continues to smoke Cardiac catheterization showing severe three-vessel disease, patent proximal LAD stent, moderate distal LAD disease Significant stenosis proximal left circumflex, bifurcation lesion, heavily calcified, RCA stent patent, proximal RCA disease It was felt his cardiomyopathy is out of proportion to his coronary disease Management of his atrial fibrillation and heart failure symptoms felt to be more of a priority It was recommended he have pressors and diuresis For this could be initiated, he left the hospital [facility] January 20, 2020  Admitted to [facility] 01/29/2020 Treated for CHF, left AMA August 25  Seen in the emergency room February 03, 2020 for blurred  vision  Presents today, has worsening swelling in his legs, left thigh is very tight and edematous now extending up to his abdomen Reports he is compliant with his torsemide 40 twice daily Feels he does better on the IV Lasix, requesting IV today Feels his heart is " giving out", unable to walk very far  EKG personally reviewed by myself on todays visit Shows atrial fibrillation ventricular rate 72 bpm right bundle branch block  Other past medical history reviewed Presentation to the hospital May 2020 acute on chronic systolic CHF Treated with Lasix IV, transition to torsemide Toprol XL and spironolactone   with continued -ACEi has previously been held secondary to relative hypotension  LE stent placed 08/2018 Percutaneous transluminal angioplasty of the entire left SFA and above-knee popliteal artery with a 5 mm diameter by 30 cm length and a 5 mm diameter by 22 cm length Lutonix drug-coated angioplasty balloon  Prior CV studies:   The following studies were reviewed today:  Cardiac cath: 04/2017 1.  Significant three-vessel coronary artery disease with patent stent in the RCA and LAD.  There is significant proximal RCA disease before the stent as well as diffuse mid and distal disease in a vessel that appears to be about 2.5 mm in diameter.  There is significant bifurcation stenosis in the proximal left circumflex with a large OM1.  The LAD has moderate disease. The coronary arteries are moderately calcified and diffusely diseased throughout. 2.  Left ventricular angiography was not performed.  EF was moderately to severely reduced by echo. 3.  Severely elevated left ventricular end-diastolic pressure at 34 mmHg.  2D Echo 11/06/2018: 1. The left ventricle has severely reduced systolic function, with an ejection fraction of 20-25%. The cavity size was moderately dilated. Indeterminate diastolic filling due to E-A fusion. Left ventricular diffuse hypokinesis. 2. The right ventricle has  severely reduced systolic function. The cavity was mildly enlarged. There is no increase in right ventricular wall thickness. Right ventricular systolic pressure is moderately elevated with an estimated pressure of 59.6 mmHg. 3. Left atrial size was mildly dilated. 4. Right atrial size was mildly dilated. 5. The mitral valve is degenerative. Mild thickening of the mitral valve leaflet. Mild calcification of the mitral valve leaflet. There is mild mitral annular calcification present. 6. The tricuspid valve is degenerative. Tricuspid valve regurgitation is moderate. 7. The aortic valve was not well visualized. Moderate thickening of the aortic valve. Moderate calcification of the aortic valve. 8. The inferior vena cava was dilated in size with <50% respiratory variability.  Past Medical History:  Diagnosis Date  . Arthritis   . Asthma   . Atherosclerosis   . BPH (benign prostatic hyperplasia)   . Carotid arterial disease ([place])   . Charcot's joint of foot, right   . Chronic combined systolic (congestive) and diastolic (congestive) heart failure ([place])    a. 02/2013 EF 50% by LV gram; b. 04/2017 Echo: EF 25-30%. diff HK. Gr2 DD. Mod dil LA/RV. PASP 41mmHg.  [name] COPD (chronic obstructive pulmonary disease) ([place])   . Coronary artery disease    a. 2013 S/P PCI of LAD [facility]);  b. 03/2013 PCI: RCA 90p ( 2.5 x 23 mm DES); c. 02/2014 Cath: patent RCA stent->Med Rx; d. 04/2017 Cath: LM nl, LAd 30ost/p, 1m/d, D1 80ost, LCX 90p/m, OM1 85, RCA 70/20p, 58m, 70d->Referred for CT Surg-felt to be poor candidate.  . Diabetic neuropathy ([place])   . Diabetic ulcer of right foot ([place])   . Hernia   . Hyperlipidemia   . Hypertension   . Ischemic cardiomyopathy    a. 04/2017 Echo: EF 25-30%; b. 08/2017 Cardiac MRI ([facility]): EF 19%, sev glob HK. RVEF 20%, mod BAE, triv MR, mild-mod TR. Basal lateral subendocardial infarct (viable), inf/infsept, dist septal ischemia, basal to mid lat peri-infarct ischemia.  . Morbid  obesity ([place])   . Neuropathy   . PAD (peripheral artery disease) ([place])    a. Followed by Dr. Lucky Cowboy; b. 08/2010 Periph Angio: RSFA 70-80p (6X60 self-expanding stent); c. 09/2010 Periph Angio: L SFA 100p (7x unknown length self-expanding stent); d. 06/2015 Periph Angio: R SFA short segment occlusion (6x12 self-expanding stent).  . Restless leg syndrome   . Sleep apnea   . Subclavian artery stenosis, right ([place])   . Syncope and collapse   . Tobacco use    a. 75+ yr hx - still smoking 1ppd, down from 2 ppd.  . Type II diabetes mellitus ([place])   . Varicose vein    Past Surgical History:  Procedure Laterality Date  . ABDOMINAL AORTAGRAM N/A 06/23/2013   Procedure: ABDOMINAL Maxcine Ham;  Surgeon: [place] Hampshire, MD;  Location: [place] CATH LAB;  Service: Cardiovascular;  Laterality: N/A;  . AMPUTATION Right 04/01/2018   Procedure: AMPUTATION BELOW KNEE;  Surgeon: Algernon Huxley, MD;  Location: [facility] ORS;  Service: Vascular;  Laterality: Right;  . APPENDECTOMY    . CARDIAC CATHETERIZATION  10/14   [facility]; X1 STENT PROXIMAL RCA  . CARDIAC CATHETERIZATION  02/2010   [facility]  . CARDIAC CATHETERIZATION  02/21/2014   [facility]  . CLAVICLE SURGERY    . HERNIA  REPAIR    . IRRIGATION AND DEBRIDEMENT FOOT Right 01/04/2018   Procedure: IRRIGATION AND DEBRIDEMENT FOOT;  Surgeon: Samara Deist, DPM;  Location: [facility] ORS;  Service: Podiatry;  Laterality: Right;  . LEFT HEART CATH AND CORONARY ANGIOGRAPHY N/A 01/20/2020   Procedure: LEFT HEART CATH AND CORONARY ANGIOGRAPHY;  Surgeon: Wellington Hampshire, MD;  Location: [place] CV LAB;  Service: Cardiovascular;  Laterality: N/A;  . LEFT HEART CATH AND CORS/GRAFTS ANGIOGRAPHY N/A 04/28/2017   Procedure: LEFT HEART CATH AND CORONARY ANGIOGRAPHY;  Surgeon: Wellington Hampshire, MD;  Location: [place] CV LAB;  Service: Cardiovascular;  Laterality: N/A;  . LOWER EXTREMITY ANGIOGRAPHY Right 03/03/2018   Procedure: LOWER EXTREMITY ANGIOGRAPHY;  Surgeon: Katha Cabal, MD;   Location: [place] CV LAB;  Service: Cardiovascular;  Laterality: Right;  . LOWER EXTREMITY ANGIOGRAPHY Left 08/24/2018   Procedure: LOWER EXTREMITY ANGIOGRAPHY;  Surgeon: Algernon Huxley, MD;  Location: [place] CV LAB;  Service: Cardiovascular;  Laterality: Left;  . LOWER EXTREMITY ANGIOGRAPHY Left 06/14/2019   Procedure: LOWER EXTREMITY ANGIOGRAPHY;  Surgeon: Algernon Huxley, MD;  Location: [place] CV LAB;  Service: Cardiovascular;  Laterality: Left;  . LOWER EXTREMITY ANGIOGRAPHY Left 08/02/2019   Procedure: LOWER EXTREMITY ANGIOGRAPHY;  Surgeon: Algernon Huxley, MD;  Location: [place] CV LAB;  Service: Cardiovascular;  Laterality: Left;  . PERIPHERAL ARTERIAL STENT GRAFT     x2 left/right  . PERIPHERAL VASCULAR BALLOON ANGIOPLASTY Right 01/06/2018   Procedure: PERIPHERAL VASCULAR BALLOON ANGIOPLASTY;  Surgeon: Katha Cabal, MD;  Location: [place] CV LAB;  Service: Cardiovascular;  Laterality: Right;  . PERIPHERAL VASCULAR CATHETERIZATION N/A 07/06/2015   Procedure: Abdominal Aortogram w/Lower Extremity;  Surgeon: Algernon Huxley, MD;  Location: [place] CV LAB;  Service: Cardiovascular;  Laterality: N/A;  . PERIPHERAL VASCULAR CATHETERIZATION  07/06/2015   Procedure: Lower Extremity Intervention;  Surgeon: Algernon Huxley, MD;  Location: [place] CV LAB;  Service: Cardiovascular;;     Current Meds  Medication Sig  . albuterol (VENTOLIN HFA) 108 (90 Base) MCG/ACT inhaler Inhale into the lungs every 6 (six) hours as needed for wheezing or shortness of breath.  [name] aspirin EC 81 MG tablet Take 1 tablet (81 mg total) by mouth daily.  [name] atorvastatin (LIPITOR) 80 MG tablet Take 80 mg by mouth daily.  . budesonide-formoterol (SYMBICORT) 80-4.5 MCG/ACT inhaler Inhale 2 puffs into the lungs as needed.  . cephALEXin (KEFLEX) 500 MG capsule Take 1 capsule (500 mg total) by mouth 3 (three) times daily.  . clopidogrel (PLAVIX) 75 MG tablet Take 75 mg by mouth daily.   . collagenase  (SANTYL) ointment Apply 1 application topically daily.  . DULoxetine (CYMBALTA) 20 MG capsule Take 20 mg by mouth daily.  [name] ELIQUIS 5 MG TABS tablet Take 5 mg by mouth 2 (two) times daily.  . empagliflozin (JARDIANCE) 10 MG TABS tablet Take 10 mg by mouth daily.   [name] ezetimibe (ZETIA) 10 MG tablet Take 10 mg by mouth daily.  [name] gabapentin (NEURONTIN) 800 MG tablet Take 800 mg by mouth 4 (four) times daily.   [name] ipratropium-albuterol (DUONEB) 0.5-2.5 (3) MG/3ML SOLN Inhale 3 mLs into the lungs every 6 (six) hours as needed (wheezing/sob).   . isosorbide mononitrate (IMDUR) 30 MG 24 hr tablet Take 30 mg by mouth daily.  . magnesium oxide (MAG-OX) 400 MG tablet Take 400 mg by mouth.   . metFORMIN (GLUCOPHAGE-XR) 500 MG 24 hr tablet Take 1,000 mg by mouth  2 (two) times daily.   . metoprolol succinate (TOPROL-XL) 25 MG 24 hr tablet Take 1 tablet (25 mg total) by mouth daily.  . Multiple Vitamin (MULTI-VITAMINS) TABS Take 1 tablet by mouth daily.   . nitroGLYCERIN (NITROSTAT) 0.4 MG SL tablet Place 1 tablet (0.4 mg total) under the tongue every 5 (five) minutes as needed for chest pain.  [name] nystatin (MYCOSTATIN) 100000 UNIT/ML suspension Take 5 mLs by mouth 4 (four) times daily.  [name] omeprazole (PRILOSEC) 40 MG capsule Take 40 mg by mouth daily.  . Oxycodone HCl 10 MG TABS Take 10 mg by mouth every 6 (six) hours as needed (pain).   . pantoprazole (PROTONIX) 40 MG tablet Take 40 mg by mouth daily.  . potassium chloride (KLOR-CON) 10 MEQ tablet Take 1 tablet (10 mEq total) by mouth as directed. Take 1 tablet twice a day on days with metolazone take 4 tablets (40 mEq)  . promethazine (PHENERGAN) 25 MG tablet Take 25 mg by mouth every 6 (six) hours as needed.  . sacubitril-valsartan (ENTRESTO) 24-26 MG Take 1 tablet by mouth 2 (two) times daily.  [name] spironolactone (ALDACTONE) 25 MG tablet Take 1 tablet (25 mg total) by mouth daily.  [name] torsemide (DEMADEX) 20 MG tablet Take 3 tablets (60 mg total) by mouth daily.   . traZODone (DESYREL) 150 MG tablet Take 150 mg by mouth at bedtime.  [name] umeclidinium bromide (INCRUSE ELLIPTA) 62.5 MCG/INH AEPB Inhale 1 puff into the lungs daily.      Allergies:   Dulaglutide, Other, and Prednisone   Social History   Tobacco Use  . Smoking status: Current Every Day Smoker    Packs/day: 1.00    Years: 41.00    Pack years: 41.00    Types: Cigarettes  . Smokeless tobacco: Never Used  Vaping Use  . Vaping Use: Never used  Substance Use Topics  . Alcohol use: No  . Drug use: No     Family Hx: The patient's family history includes Heart attack in his father; Heart disease in his father; Hyperlipidemia in his father and mother; Hypertension in his father and mother.  ROS:   Please see the history of present illness.    Review of Systems  Constitutional: Negative.   HENT: Negative.   Respiratory: Negative.   Cardiovascular: Positive for leg swelling.  Gastrointestinal: Negative.   Musculoskeletal: Negative.   Neurological: Negative.   Psychiatric/Behavioral: Negative.   All other systems reviewed and are negative.    Labs/Other Tests and Data Reviewed:    Recent Labs: 09/17/2019: ALT 10 09/28/2019: Magnesium 1.7 01/19/2020: B Natriuretic Peptide 1,511.7 01/20/2020: BUN 21; Creatinine, Ser 1.23; Hemoglobin 10.6; Platelets 153; Potassium 3.5; Sodium 132   Recent Lipid Panel Lab Results  Component Value Date/Time   CHOL 118 01/20/2020 03:52 AM   CHOL 211 (H) 04/05/2013 05:20 AM   TRIG 37 01/20/2020 03:52 AM   TRIG 321 (H) 04/05/2013 05:20 AM   HDL 34 (L) 01/20/2020 03:52 AM   HDL 34 (L) 04/05/2013 05:20 AM   CHOLHDL 3.5 01/20/2020 03:52 AM   LDLCALC 77 01/20/2020 03:52 AM   LDLCALC 113 (H) 04/05/2013 05:20 AM    Wt Readings from Last 3 Encounters:  02/29/20 210 lb (95.3 kg)  02/27/20 210 lb (95.3 kg)  02/24/20 210 lb 9.6 oz (95.5 kg)     Exam:    Vital Signs: Vital signs may also be detailed in the HPI BP 106/70 (BP Location: Left Arm,  Patient Position: Sitting, Cuff  Size: Normal)   Pulse 72   Ht 5\' 7"  (1.702 m)   Wt 210 lb (95.3 kg)   SpO2 97%   BMI 32.89 kg/m   Constitutional:  oriented to person, place, and time. No distress.  HENT:  Head: Grossly normal Eyes:  no discharge. No scleral icterus.  Neck: JVD 10+, no carotid bruits  Cardiovascular: Regular rate and rhythm, no murmurs appreciated Woody leg edema 2+ pitting Pulmonary/Chest: Rales, dullness at the bases bilaterally Abdominal: Soft.  no distension.  no tenderness.  Musculoskeletal: Normal range of motion Amputation right lower extremity Neurological:  normal muscle tone. Coordination normal. No atrophy Skin: Skin warm and dry Psychiatric: normal affect, pleasant   ASSESSMENT & PLAN:     Chronic systolic HF (heart failure) (HCC) - Recommend he continue torsemide 40 twice daily We will give him Lasix 40 IV x1 today He will contact us in 2 days time, may need to set up additional IV Lasix through same-day surgery Symptoms likely exacerbated by new onset atrial fibrillation  Coronary artery disease of native artery of native heart with stable angina pectoris ([place]) - Previously declined CABG Recent catheterization, left AMA Was at [facility], also left AMA  Type 2 diabetes mellitus with foot ulcer, with long-term current use of insulin ([place])  Followed by vascular, has had amputation on the right High risk of further disease given continued smoking  Persistent atrial fibrillation Reports compliance with his Eliquis for the past month 5 twice daily Discussed treatment options for his atrial fibrillation, he would like to try to restore normal sinus rhythm as he is not feeling well High risk of recurrent arrhythmia, we will start amiodarone 400 twice daily for 7 days then 200 twice daily We will set up a cardioversion next week Stressed importance of compliance with his Eliquis  Atherosclerosis of artery of extremity with ulceration ([place]) -  Followed  by vascular, prior stent left SFA Amputation on the right Still smoking, cessation recommended Stable  Hypotension Blood pressure stable Was low in the hospital  PAD (peripheral artery disease) ([place]) -  Smoking cessation recommended Does not want Chantix  Mixed hyperlipidemia -  Last cholesterol at goal   Total encounter time more than 45 minutes  Greater than 50% was spent in counseling and coordination of care with the patient   Signed, Ida Rogue, MD  02/29/2020 3:30 PM    [place] Office [address], [place], [place] [address]

## 2020-02-29 NOTE — Patient Instructions (Addendum)
Cardioversion for atrial fibrillation next week  Decrease fluid intake   Medication Instructions:  Amiodarone 400 mg twice a day for 5 days then down to 200 mg twice daily Increase the torsemide to 40 twice a day  Lasix 40 IV x1 now  If you need a refill on your cardiac medications before your next appointment, please call your pharmacy.    Lab work: No new labs needed   If you have labs (blood work) drawn today and your tests are completely normal, you will receive your results only by: [name] MyChart Message (if you have MyChart) OR . A paper copy in the mail If you have any lab test that is abnormal or we need to change your treatment, we will call you to review the results.   Testing/Procedures: No new testing needed   Follow-Up: At [facility], you and your health needs are our priority.  As part of our continuing mission to provide you with exceptional heart care, we have created designated Provider Care Teams.  These Care Teams include your primary Cardiologist (physician) and Advanced Practice Providers (APPs -  Physician Assistants and Nurse Practitioners) who all work together to provide you with the care you need, when you need it.  . You will need a follow up appointment in 1 month  . Providers on your designated Care Team:   . Murray Hodgkins, NP . Christell Faith, PA-C . Marrianne Mood, PA-C  Any Other Special Instructions Will Be Listed Below (If Applicable).  COVID-19 Vaccine Information can be found at: ShippingScam.co.uk For questions related to vaccine distribution or appointments, please email vaccine@[email or web address] .[email or web address] or call [phone].

## 2020-03-01 ENCOUNTER — Telehealth: Payer: Self-pay | Admitting: Cardiovascular Disease

## 2020-03-01 NOTE — Telephone Encounter (Signed)
Spoke with [name] [name] at [facility]. Advised that I did speak with [name] [name] earlier this morning about clarification on his medications that we sent in. Reviewed that IV furosemide was administered here in the office and to disregard that order as it was entered incorrectly and that I did discuss this with [name] [name] earlier. She read back instructions regarding patients Furosemide 40 mg twice a day and to not fill any IV doses. She verbalized understanding of our conversation with no further questions at this time.

## 2020-03-01 NOTE — Telephone Encounter (Signed)
Sam with [facility] has 2 fluid pill rx   They have torsemide and furosemide   Please call to clarify which patient should take.

## 2020-03-01 NOTE — Telephone Encounter (Signed)
Left voicemail message on pharmacy line to please call back for clarification of medication sent in yesterday.

## 2020-03-01 NOTE — Telephone Encounter (Signed)
Spoke with Iona Beard at [facility] and patient has not picked up prescription. Clarified instructions for amiodarone 400 mg twice a day for 5 days and then decrease to 200 mg twice a day. Iona Beard read back information, discussed that I would call patient to make him aware of changes because paperwork he received had 400 mg twice a day. Also advised that the IV furosemide was given here in the office and entered to pharmacy. He verbalized understanding of our conversation, read back all information, and let him know that I would also call patient to review this information as well. He was appreciative for the call with no further questions at this time.

## 2020-03-01 NOTE — Telephone Encounter (Signed)
Spoke with patient to clarify his amiodarone dosing. He read back information and inquired if he had good results from the IV lasix given yesterday. He stated that he did not have very much output. Let him know that I would make provider aware and that if additional dose is needed later this week over at short stay, and if Dr. Rockey Situ wants to move forward with cardioversion later next week then I would give him a call back with that information. He verbalized understanding of conversation, was agreeable with plan, and had no further questions at this time.

## 2020-03-01 NOTE — Addendum Note (Signed)
Addended by: Raelene Bott, Cace Osorto L on: 03/01/2020 01:13 PM   Modules accepted: Orders

## 2020-03-02 ENCOUNTER — Encounter (INDEPENDENT_AMBULATORY_CARE_PROVIDER_SITE_OTHER): Payer: Self-pay

## 2020-03-02 ENCOUNTER — Other Ambulatory Visit: Payer: Self-pay

## 2020-03-02 ENCOUNTER — Ambulatory Visit (INDEPENDENT_AMBULATORY_CARE_PROVIDER_SITE_OTHER): Payer: Medicare Other | Admitting: Nurse Practitioner

## 2020-03-02 ENCOUNTER — Encounter (INDEPENDENT_AMBULATORY_CARE_PROVIDER_SITE_OTHER): Payer: Self-pay | Admitting: Nurse Practitioner

## 2020-03-02 VITALS — BP 119/77 | HR 55 | Resp 16

## 2020-03-02 DIAGNOSIS — L97929 Non-pressure chronic ulcer of unspecified part of left lower leg with unspecified severity: Secondary | ICD-10-CM

## 2020-03-02 DIAGNOSIS — I83029 Varicose veins of left lower extremity with ulcer of unspecified site: Secondary | ICD-10-CM | POA: Diagnosis not present

## 2020-03-02 NOTE — Progress Notes (Signed)
History of Present Illness  There is no documented history at this time  Assessments & Plan   There are no diagnoses linked to this encounter.    Additional instructions  Subjective:  Patient presents with venous ulcer of the Left lower extremity.    Procedure:  3 layer unna wrap was placed Left lower extremity.   Plan:   Follow up in one week.  

## 2020-03-02 NOTE — Progress Notes (Signed)
Subjective:    Patient ID: [name] [name], male    DOB: 07-20-1960, 59 y.o.   MRN: [id number] Chief Complaint  Patient presents with  . Follow-up    ultrasound follow up    Patient is seen for follow up evaluation of leg pain and swelling associated with venous ulceration. The patient was recently seen here and started on Unna boot therapy.  The swelling abruptly became much worse bilaterally and is associated with pain and discoloration. The pain and swelling worsens with prolonged dependency and improves with elevation.  The patient notes that in the morning the legs are better but the leg symptoms worsened throughout the course of the day. The patient has also noted a progressive worsening of the discoloration in the ankle and shin area.    The patient states that they have been elevating as much as possible. The patient denies any recent changes in medications.  The patient denies a history of DVT or PE. There is no prior history of phlebitis. There is no history of primary lymphedema.  No SOB or increased cough.  No sputum production.  Recent episodes of CHF exacerbation.  No evidence of DVT seen in the left lower extremity.  No evidence of deep venous insufficiency or superficial venous reflux seen in the superficial saphenous veins   Review of Systems  Cardiovascular: Positive for leg swelling.  Skin: Positive for wound.  All other systems reviewed and are negative.      Objective:   Physical Exam Vitals reviewed.  HENT:     Head: Normocephalic.  Cardiovascular:     Rate and Rhythm: Normal rate.     Pulses: Normal pulses.  Pulmonary:     Effort: Pulmonary effort is normal.  Musculoskeletal:     Left lower leg: Edema present.     Right Lower Extremity: Right leg is amputated below knee.  Neurological:     Mental Status: He is alert.     Motor: Weakness present.     Gait: Gait abnormal.  Psychiatric:        Mood and Affect: Mood normal.        Behavior: Behavior  normal.        Thought Content: Thought content normal.        Judgment: Judgment normal.     BP 107/72 (BP Location: Right Arm)   Pulse (!) 117   Resp 16   Wt 210 lb 9.6 oz (95.5 kg)   BMI 32.98 kg/m   Past Medical History:  Diagnosis Date  . Arthritis   . Asthma   . Atherosclerosis   . BPH (benign prostatic hyperplasia)   . Carotid arterial disease ([place])   . Charcot's joint of foot, right   . Chronic combined systolic (congestive) and diastolic (congestive) heart failure ([place])    a. 02/2013 EF 50% by LV gram; b. 04/2017 Echo: EF 25-30%. diff HK. Gr2 DD. Mod dil LA/RV. PASP 57mmHg.  [name] COPD (chronic obstructive pulmonary disease) ([place])   . Coronary artery disease    a. 2013 S/P PCI of LAD [facility]);  b. 03/2013 PCI: RCA 90p ( 2.5 x 23 mm DES); c. 02/2014 Cath: patent RCA stent->Med Rx; d. 04/2017 Cath: LM nl, LAd 30ost/p, 26m/d, D1 80ost, LCX 90p/m, OM1 85, RCA 70/20p, 38m, 70d->Referred for CT Surg-felt to be poor candidate.  . Diabetic neuropathy ([place])   . Diabetic ulcer of right foot ([place])   . Hernia   . Hyperlipidemia   . Hypertension   .  Ischemic cardiomyopathy    a. 04/2017 Echo: EF 25-30%; b. 08/2017 Cardiac MRI ([facility]): EF 19%, sev glob HK. RVEF 20%, mod BAE, triv MR, mild-mod TR. Basal lateral subendocardial infarct (viable), inf/infsept, dist septal ischemia, basal to mid lat peri-infarct ischemia.  . Morbid obesity ([place])   . Neuropathy   . PAD (peripheral artery disease) ([place])    a. Followed by Dr. Lucky Cowboy; b. 08/2010 Periph Angio: RSFA 70-80p (6X60 self-expanding stent); c. 09/2010 Periph Angio: L SFA 100p (7x unknown length self-expanding stent); d. 06/2015 Periph Angio: R SFA short segment occlusion (6x12 self-expanding stent).  . Restless leg syndrome   . Sleep apnea   . Subclavian artery stenosis, right ([place])   . Syncope and collapse   . Tobacco use    a. 75+ yr hx - still smoking 1ppd, down from 2 ppd.  . Type II diabetes mellitus ([place])   . Varicose vein     Social  History   Socioeconomic History  . Marital status: Single    Spouse name: Not on file  . Number of children: 0  . Years of education: 66  . Highest education level: 12th grade  Occupational History  . Occupation: on disability  Tobacco Use  . Smoking status: Current Every Day Smoker    Packs/day: 1.00    Years: 41.00    Pack years: 41.00    Types: Cigarettes  . Smokeless tobacco: Never Used  Vaping Use  . Vaping Use: Never used  Substance and Sexual Activity  . Alcohol use: No  . Drug use: No  . Sexual activity: Yes  Other Topics Concern  . Not on file  Social History Narrative   Lives at home in [place] with girlfriend. Independent at baseline.   Social Determinants of Health   Financial Resource Strain:   . Difficulty of Paying Living Expenses: Not on file  Food Insecurity:   . Worried About Charity fundraiser in the Last Year: Not on file  . Ran Out of Food in the Last Year: Not on file  Transportation Needs:   . Lack of Transportation (Medical): Not on file  . Lack of Transportation (Non-Medical): Not on file  Physical Activity:   . Days of Exercise per Week: Not on file  . Minutes of Exercise per Session: Not on file  Stress:   . Feeling of Stress : Not on file  Social Connections:   . Frequency of Communication with Friends and Family: Not on file  . Frequency of Social Gatherings with Friends and Family: Not on file  . Attends Religious Services: Not on file  . Active Member of Clubs or Organizations: Not on file  . Attends Archivist Meetings: Not on file  . Marital Status: Not on file  Intimate Partner Violence:   . Fear of Current or Ex-Partner: Not on file  . Emotionally Abused: Not on file  . Physically Abused: Not on file  . Sexually Abused: Not on file    Past Surgical History:  Procedure Laterality Date  . ABDOMINAL AORTAGRAM N/A 06/23/2013   Procedure: ABDOMINAL Maxcine Ham;  Surgeon: Wellington Hampshire, MD;  Location: [facility]ATH [facility];   Service: Cardiovascular;  Laterality: N/A;  . AMPUTATION Right 04/01/2018   Procedure: AMPUTATION BELOW KNEE;  Surgeon: Algernon Huxley, MD;  Location: [facility] ORS;  Service: Vascular;  Laterality: Right;  . APPENDECTOMY    . CARDIAC CATHETERIZATION  10/14   [facility]; X1 STENT PROXIMAL RCA  .  CARDIAC CATHETERIZATION  02/2010   [facility]  . CARDIAC CATHETERIZATION  02/21/2014   [facility]  . CLAVICLE SURGERY    . HERNIA REPAIR    . IRRIGATION AND DEBRIDEMENT FOOT Right 01/04/2018   Procedure: IRRIGATION AND DEBRIDEMENT FOOT;  Surgeon: Samara Deist, DPM;  Location: [facility] ORS;  Service: Podiatry;  Laterality: Right;  . LEFT HEART CATH AND CORONARY ANGIOGRAPHY N/A 01/20/2020   Procedure: LEFT HEART CATH AND CORONARY ANGIOGRAPHY;  Surgeon: Wellington Hampshire, MD;  Location: [facility];  Service: Cardiovascular;  Laterality: N/A;  . LEFT HEART CATH AND CORS/GRAFTS ANGIOGRAPHY N/A 04/28/2017   Procedure: LEFT HEART CATH AND CORONARY ANGIOGRAPHY;  Surgeon: Wellington Hampshire, MD;  Location: [place] CV [facility];  Service: Cardiovascular;  Laterality: N/A;  . LOWER EXTREMITY ANGIOGRAPHY Right 03/03/2018   Procedure: LOWER EXTREMITY ANGIOGRAPHY;  Surgeon: Katha Cabal, MD;  Location: [place] [facility] [facility];  Service: Cardiovascular;  Laterality: Right;  . LOWER EXTREMITY ANGIOGRAPHY Left 08/24/2018   Procedure: LOWER EXTREMITY ANGIOGRAPHY;  Surgeon: Algernon Huxley, MD;  Location: [place] [facility];  Service: Cardiovascular;  Laterality: Left;  . LOWER EXTREMITY ANGIOGRAPHY Left 06/14/2019   Procedure: LOWER EXTREMITY ANGIOGRAPHY;  Surgeon: Algernon Huxley, MD;  Location: [place] [facility];  Service: Cardiovascular;  Laterality: Left;  . LOWER EXTREMITY ANGIOGRAPHY Left 08/02/2019   Procedure: LOWER EXTREMITY ANGIOGRAPHY;  Surgeon: Algernon Huxley, MD;  Location: [place] CV [facility];  Service: Cardiovascular;  Laterality: Left;  . PERIPHERAL ARTERIAL STENT GRAFT     x2 left/right  . PERIPHERAL VASCULAR BALLOON  ANGIOPLASTY Right 01/06/2018   Procedure: PERIPHERAL VASCULAR BALLOON ANGIOPLASTY;  Surgeon: Katha Cabal, MD;  Location: [place] [facility];  Service: Cardiovascular;  Laterality: Right;  . PERIPHERAL VASCULAR CATHETERIZATION N/A 07/06/2015   Procedure: Abdominal Aortogram w/Lower Extremity;  Surgeon: Algernon Huxley, MD;  Location: [place] CV [facility];  Service: Cardiovascular;  Laterality: N/A;  . PERIPHERAL VASCULAR CATHETERIZATION  07/06/2015   Procedure: Lower Extremity Intervention;  Surgeon: Algernon Huxley, MD;  Location: [place] [facility];  Service: Cardiovascular;;    Family History  Problem Relation Age of Onset  . Heart attack Father   . Heart disease Father   . Hypertension Father   . Hyperlipidemia Father   . Hypertension Mother   . Hyperlipidemia Mother     Allergies  Allergen Reactions  . Dulaglutide Anaphylaxis, Diarrhea and Hives  . Other Itching    Skin itching associated with nitro patch  . Prednisone Rash       Assessment & Plan:   1. Venous ulcer of left leg (HCC) We will place the patient into Unna wraps for another 1 to 2 weeks to help with healing of venous ulceration. The patient is also advised to purchase medical grade 1 compression stockings that should help with lymphedema and prevent recurrent ulceration formation. He will present to the office for weekly labs.  2. HFrEF (heart failure with reduced ejection fraction) ([place]) Patient recently had an episode of diuresis which helped to decrease the swelling. The swelling is likely related to his heart failure exacerbations. While swelling is much improved he still continues to have edema. Patient is encouraged to continue with his current heart failure medications have close follow-up with his cardiologist  3. Atherosclerosis of native arteries of the extremities with ulceration ([place]) Currently patient's wounds are healing without significant issue. We will have patient return in 3 months with  noninvasive studies for  his atherosclerotic disease.   Current Outpatient Medications on File Prior to Visit  Medication Sig Dispense Refill  . albuterol (VENTOLIN HFA) 108 (90 Base) MCG/ACT inhaler Inhale into the lungs every 6 (six) hours as needed for wheezing or shortness of breath.    [name] aspirin EC 81 MG tablet Take 1 tablet (81 mg total) by mouth daily. 90 tablet 3  . atorvastatin (LIPITOR) 80 MG tablet Take 80 mg by mouth daily.    . cephALEXin (KEFLEX) 500 MG capsule Take 1 capsule (500 mg total) by mouth 3 (three) times daily. 30 capsule 1  . clopidogrel (PLAVIX) 75 MG tablet Take 75 mg by mouth daily.     . collagenase (SANTYL) ointment Apply 1 application topically daily. 90 g 1  . DULoxetine (CYMBALTA) 20 MG capsule Take 20 mg by mouth daily.    [name] ELIQUIS 5 MG TABS tablet Take 5 mg by mouth 2 (two) times daily.    . empagliflozin (JARDIANCE) 10 MG TABS tablet Take 10 mg by mouth daily.     [name] ezetimibe (ZETIA) 10 MG tablet TAKE 1 TABLET BY MOUTH ONCE DAILY (Patient taking differently: Take 10 mg by mouth daily. ) 90 tablet 3  . ipratropium-albuterol (DUONEB) 0.5-2.5 (3) MG/3ML SOLN Inhale 3 mLs into the lungs every 6 (six) hours as needed (wheezing/sob).     . isosorbide dinitrate (ISORDIL) 30 MG tablet Take by mouth.    . magnesium oxide (MAG-OX) 400 MG tablet Take 400 mg by mouth.     . metFORMIN (GLUCOPHAGE-XR) 500 MG 24 hr tablet Take 1,000 mg by mouth 2 (two) times daily.     . metoprolol succinate (TOPROL-XL) 25 MG 24 hr tablet Take 1 tablet (25 mg total) by mouth daily. 15 tablet 5  . Multiple Vitamin (MULTI-VITAMINS) TABS Take 1 tablet by mouth daily.     . nitroGLYCERIN (NITROSTAT) 0.4 MG SL tablet Place 1 tablet (0.4 mg total) under the tongue every 5 (five) minutes as needed for chest pain. 30 tablet 5  . nystatin (MYCOSTATIN) 100000 UNIT/ML suspension Take 5 mLs by mouth 4 (four) times daily.    [name] omeprazole (PRILOSEC) 40 MG capsule Take 40 mg by mouth daily.    .  Oxycodone HCl 10 MG TABS Take 10 mg by mouth every 6 (six) hours as needed (pain).     . pantoprazole (PROTONIX) 40 MG tablet Take 40 mg by mouth daily.    . promethazine (PHENERGAN) 25 MG tablet Take 25 mg by mouth every 6 (six) hours as needed.    . sacubitril-valsartan (ENTRESTO) 24-26 MG Take 1 tablet by mouth 2 (two) times daily. 60 tablet 2  . spironolactone (ALDACTONE) 25 MG tablet Take 1 tablet (25 mg total) by mouth daily. 30 tablet 1  . SYMBICORT 80-4.5 MCG/ACT inhaler Inhale 2 puffs into the lungs daily.     [name] torsemide (DEMADEX) 20 MG tablet Take 3 tablets (60 mg total) by mouth daily. 270 tablet 1  . traZODone (DESYREL) 150 MG tablet Take 150 mg by mouth at bedtime.    [name] umeclidinium bromide (INCRUSE ELLIPTA) 62.5 MCG/INH AEPB Inhale 1 puff into the lungs daily.     [name] gabapentin (NEURONTIN) 800 MG tablet Take 800 mg by mouth 4 (four) times daily.     . potassium chloride (KLOR-CON) 10 MEQ tablet Take 1 tablet (10 mEq total) by mouth as directed. Take 1 tablet twice a day on days with metolazone take 4 tablets (40 mEq) 90 tablet  3   No current facility-administered medications on file prior to visit.    There are no Patient Instructions on file for this visit. No follow-ups on file.   Kris Hartmann, NP

## 2020-03-03 MED ORDER — TORSEMIDE 20 MG PO TABS: 40.0000 mg | ORAL_TABLET | Freq: Two times a day (BID) | ORAL | 5 refills | Status: DC

## 2020-03-03 NOTE — Addendum Note (Signed)
Addended by: Annia Belt on: 03/03/2020 04:25 PM   Modules accepted: Orders

## 2020-03-03 NOTE — Telephone Encounter (Signed)
Sam from [facility] calling to clarify fluid pill as there is torsemide and furosemide ordered .     This is unrelated to iv order sent in error per pharmacy.    Please call to discuss .    Concern from is patient has already been on torsemide and now furosemide

## 2020-03-03 NOTE — Telephone Encounter (Signed)
Spoke with pharmacist, Sam. It appears furosemide 40 mg two times a day was sent in instead of Torsemide 40 mg two times a day. From office visit note on 02/29/20 with Dr Rockey Situ: Chronic systolic HF (heart failure) ([place]) - Recommend he continue torsemide 40 twice daily  The patient never received the Furosemide from the pharmacy as the pharmacist wanted to clarify and make sure this was correct. Confirmed with him that Dr Rockey Situ ordered to increase Torsemide to 40 mg two times a day. Pharmacist will discontinue the furosemide.

## 2020-03-06 ENCOUNTER — Other Ambulatory Visit: Payer: Medicare Other

## 2020-03-06 DIAGNOSIS — Z20822 Contact with and (suspected) exposure to covid-19: Secondary | ICD-10-CM

## 2020-03-07 ENCOUNTER — Telehealth (HOSPITAL_COMMUNITY): Payer: Self-pay

## 2020-03-07 LAB — SARS-COV-2, NAA 2 DAY TAT

## 2020-03-07 LAB — NOVEL CORONAVIRUS, NAA: SARS-CoV-2, NAA: NOT DETECTED

## 2020-03-07 NOTE — Telephone Encounter (Signed)
Attempted to contact, left message.  ° °Jalil Lorusso °[place] EMT-Paramedic °[phone] °

## 2020-03-08 ENCOUNTER — Telehealth: Payer: Self-pay | Admitting: *Deleted

## 2020-03-08 NOTE — Telephone Encounter (Signed)
I am not able to pull up that AVS version before it changed to determine orders that were given to patient. I do know he received IV furosemide in office.

## 2020-03-08 NOTE — Telephone Encounter (Signed)
Reviewed negative Covid results with patient and his GF.

## 2020-03-09 ENCOUNTER — Other Ambulatory Visit: Payer: Self-pay

## 2020-03-09 ENCOUNTER — Encounter (INDEPENDENT_AMBULATORY_CARE_PROVIDER_SITE_OTHER): Payer: Self-pay

## 2020-03-09 ENCOUNTER — Encounter (INDEPENDENT_AMBULATORY_CARE_PROVIDER_SITE_OTHER): Payer: Medicare Other

## 2020-03-09 ENCOUNTER — Ambulatory Visit (INDEPENDENT_AMBULATORY_CARE_PROVIDER_SITE_OTHER): Payer: Medicare Other | Admitting: Nurse Practitioner

## 2020-03-09 VITALS — BP 139/85 | HR 108 | Resp 16

## 2020-03-09 DIAGNOSIS — I83029 Varicose veins of left lower extremity with ulcer of unspecified site: Secondary | ICD-10-CM

## 2020-03-09 DIAGNOSIS — L97929 Non-pressure chronic ulcer of unspecified part of left lower leg with unspecified severity: Secondary | ICD-10-CM

## 2020-03-09 NOTE — Progress Notes (Signed)
History of Present Illness  There is no documented history at this time  Assessments & Plan   There are no diagnoses linked to this encounter.    Additional instructions  Subjective:  Patient presents with venous ulcer of the Left lower extremity.    Procedure:  3 layer unna wrap was placed Left lower extremity.   Plan:   Follow up in one week.  

## 2020-03-16 ENCOUNTER — Other Ambulatory Visit: Payer: Self-pay

## 2020-03-16 ENCOUNTER — Ambulatory Visit (INDEPENDENT_AMBULATORY_CARE_PROVIDER_SITE_OTHER): Payer: Medicare Other | Admitting: Nurse Practitioner

## 2020-03-16 ENCOUNTER — Encounter (INDEPENDENT_AMBULATORY_CARE_PROVIDER_SITE_OTHER): Payer: Self-pay

## 2020-03-16 VITALS — BP 120/80 | HR 94 | Resp 16 | Wt 204.0 lb

## 2020-03-16 DIAGNOSIS — I83029 Varicose veins of left lower extremity with ulcer of unspecified site: Secondary | ICD-10-CM

## 2020-03-16 DIAGNOSIS — L97929 Non-pressure chronic ulcer of unspecified part of left lower leg with unspecified severity: Secondary | ICD-10-CM

## 2020-03-16 NOTE — Progress Notes (Signed)
History of Present Illness  There is no documented history at this time  Assessments & Plan   There are no diagnoses linked to this encounter.    Additional instructions  Subjective:  Patient presents with venous ulcer of the Left lower extremity.    Procedure:  3 layer unna wrap was placed Left lower extremity.   Plan:   Follow up in one week.  

## 2020-03-20 ENCOUNTER — Encounter (INDEPENDENT_AMBULATORY_CARE_PROVIDER_SITE_OTHER): Payer: Self-pay | Admitting: Nurse Practitioner

## 2020-03-23 ENCOUNTER — Other Ambulatory Visit: Payer: Self-pay

## 2020-03-23 ENCOUNTER — Encounter (INDEPENDENT_AMBULATORY_CARE_PROVIDER_SITE_OTHER): Payer: Self-pay | Admitting: Nurse Practitioner

## 2020-03-23 ENCOUNTER — Ambulatory Visit (INDEPENDENT_AMBULATORY_CARE_PROVIDER_SITE_OTHER): Payer: Medicare Other | Admitting: Nurse Practitioner

## 2020-03-23 VITALS — BP 104/72 | HR 98 | Ht 66.0 in | Wt 202.0 lb

## 2020-03-23 DIAGNOSIS — L03116 Cellulitis of left lower limb: Secondary | ICD-10-CM | POA: Diagnosis not present

## 2020-03-23 DIAGNOSIS — I890 Lymphedema, not elsewhere classified: Secondary | ICD-10-CM | POA: Diagnosis not present

## 2020-03-23 MED ORDER — LEVOFLOXACIN 500 MG PO TABS
500.0000 mg | ORAL_TABLET | Freq: Every day | ORAL | 0 refills | Status: DC
Start: 2020-03-23 — End: 2020-04-17

## 2020-03-26 ENCOUNTER — Emergency Department
Admission: EM | Admit: 2020-03-26 | Discharge: 2020-03-26 | Disposition: A | Discharge status: Home / Self Care | Payer: Medicare Other | Attending: Emergency Medicine | Admitting: Emergency Medicine

## 2020-03-26 ENCOUNTER — Other Ambulatory Visit: Payer: Self-pay

## 2020-03-26 DIAGNOSIS — F17210 Nicotine dependence, cigarettes, uncomplicated: Secondary | ICD-10-CM | POA: Diagnosis not present

## 2020-03-26 DIAGNOSIS — I110 Hypertensive heart disease with heart failure: Secondary | ICD-10-CM | POA: Diagnosis not present

## 2020-03-26 DIAGNOSIS — J441 Chronic obstructive pulmonary disease with (acute) exacerbation: Secondary | ICD-10-CM | POA: Diagnosis not present

## 2020-03-26 DIAGNOSIS — J45909 Unspecified asthma, uncomplicated: Secondary | ICD-10-CM | POA: Diagnosis not present

## 2020-03-26 DIAGNOSIS — Z79899 Other long term (current) drug therapy: Secondary | ICD-10-CM | POA: Diagnosis not present

## 2020-03-26 DIAGNOSIS — I2510 Atherosclerotic heart disease of native coronary artery without angina pectoris: Secondary | ICD-10-CM | POA: Diagnosis not present

## 2020-03-26 DIAGNOSIS — Z7902 Long term (current) use of antithrombotics/antiplatelets: Secondary | ICD-10-CM | POA: Insufficient documentation

## 2020-03-26 DIAGNOSIS — Z951 Presence of aortocoronary bypass graft: Secondary | ICD-10-CM | POA: Insufficient documentation

## 2020-03-26 DIAGNOSIS — M25511 Pain in right shoulder: Secondary | ICD-10-CM

## 2020-03-26 DIAGNOSIS — S40911A Unspecified superficial injury of right shoulder, initial encounter: Secondary | ICD-10-CM | POA: Diagnosis not present

## 2020-03-26 DIAGNOSIS — I5042 Chronic combined systolic (congestive) and diastolic (congestive) heart failure: Secondary | ICD-10-CM | POA: Insufficient documentation

## 2020-03-26 DIAGNOSIS — E1140 Type 2 diabetes mellitus with diabetic neuropathy, unspecified: Secondary | ICD-10-CM | POA: Diagnosis not present

## 2020-03-26 DIAGNOSIS — Z7982 Long term (current) use of aspirin: Secondary | ICD-10-CM | POA: Diagnosis not present

## 2020-03-26 DIAGNOSIS — Z7951 Long term (current) use of inhaled steroids: Secondary | ICD-10-CM | POA: Insufficient documentation

## 2020-03-26 DIAGNOSIS — G8911 Acute pain due to trauma: Secondary | ICD-10-CM

## 2020-03-26 DIAGNOSIS — X500XXA Overexertion from strenuous movement or load, initial encounter: Secondary | ICD-10-CM | POA: Insufficient documentation

## 2020-03-26 DIAGNOSIS — Z7901 Long term (current) use of anticoagulants: Secondary | ICD-10-CM | POA: Insufficient documentation

## 2020-03-26 DIAGNOSIS — Z7984 Long term (current) use of oral hypoglycemic drugs: Secondary | ICD-10-CM | POA: Insufficient documentation

## 2020-03-26 LAB — CBC WITH DIFFERENTIAL/PLATELET
Abs Immature Granulocytes: 0.02 K/uL (ref 0.00–0.07)
Basophils Absolute: 0 K/uL (ref 0.0–0.1)
Basophils Relative: 1 %
Eosinophils Absolute: 0.1 K/uL (ref 0.0–0.5)
Eosinophils Relative: 1 %
HCT: 41.2 % (ref 39.0–52.0)
Hemoglobin: 13.3 g/dL (ref 13.0–17.0)
Immature Granulocytes: 0 %
Lymphocytes Relative: 12 %
Lymphs Abs: 0.7 K/uL (ref 0.7–4.0)
MCH: 27.3 pg (ref 26.0–34.0)
MCHC: 32.3 g/dL (ref 30.0–36.0)
MCV: 84.6 fL (ref 80.0–100.0)
Monocytes Absolute: 0.5 K/uL (ref 0.1–1.0)
Monocytes Relative: 7 %
Neutro Abs: 4.8 K/uL (ref 1.7–7.7)
Neutrophils Relative %: 79 %
Platelets: 177 K/uL (ref 150–400)
RBC: 4.87 MIL/uL (ref 4.22–5.81)
RDW: 18.6 % — ABNORMAL HIGH (ref 11.5–15.5)
WBC: 6.1 K/uL (ref 4.0–10.5)
nRBC: 0 % (ref 0.0–0.2)

## 2020-03-26 LAB — BASIC METABOLIC PANEL
Anion gap: 12 (ref 5–15)
BUN: 22 mg/dL — ABNORMAL HIGH (ref 6–20)
CO2: 31 mmol/L (ref 22–32)
Calcium: 9.1 mg/dL (ref 8.9–10.3)
Chloride: 90 mmol/L — ABNORMAL LOW (ref 98–111)
Creatinine, Ser: 1.2 mg/dL (ref 0.61–1.24)
GFR, Estimated: >60 mL/min (ref >60)
Glucose, Bld: 144 mg/dL — ABNORMAL HIGH (ref 70–99)
Potassium: 4.1 mmol/L (ref 3.5–5.1)
Sodium: 133 mmol/L — ABNORMAL LOW (ref 135–145)

## 2020-03-26 MED ORDER — SODIUM CHLORIDE 0.9 % IV BOLUS
1000.0000 mL | Freq: Once | INTRAVENOUS | Status: AC
  Administered 2020-03-26: 1000 mL via INTRAVENOUS

## 2020-03-26 NOTE — ED Notes (Signed)
ED provider at bedside. Pt states R shoulder started hurting 2wk ago and 2 days ago upper R arm began to hurt. Does not know how it was injured. Current pain 7/10.

## 2020-03-26 NOTE — ED Notes (Signed)
Pt refuses bp to be taken on right arm because it "makes me want to tear this bed up!". Pt asks if we cannot take his IV out and "get it out of my ankle because vascular does it all the time". Pt is very rude with staff. Educated pt that his BP was low when he arrived and that we have to monitor his BP. Pt states, "my blood pressure is in the 70's, that means I'm working fine! I didn't come in here for that, I came in because my arm hurts!!". Again, very rude with staff.

## 2020-03-26 NOTE — ED Provider Notes (Signed)
[facility] Emergency Department Provider Note   ____________________________________________   First MD Initiated Contact with Patient 03/26/20 1549     (approximate)  I have reviewed the triage vital signs and the nursing notes.   HISTORY  Chief Complaint right shoulder injury    HPI [name] [name] is a 59 y.o. 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  Patient states that his right hand slipped off a box as he was sliding it and he felt a shooting pain followed by a numbness down his right arm.  Patient states that the pain improved overnight until this morning when he threw this arm backwards and felt a similar pain.  Patient states that this is similar to previous shoulder pain that he has had in the past but is concerned that he may need to be wearing a brace for this pain.  Of note, patient states that he was recently seen at [facility] for these chronic issues and the orthopedic surgeon did not feel comfortable operating on him given his multiple comorbidities.         Past Medical History:  Diagnosis Date  . Arthritis   . Asthma   . Atherosclerosis   . BPH (benign prostatic hyperplasia)   . Carotid arterial disease ([place])   . Charcot's joint of foot, right   . Chronic combined systolic (congestive) and diastolic (congestive) heart failure ([place])    a. 02/2013 EF 50% by LV gram; b. 04/2017 Echo: EF 25-30%. diff HK. Gr2 DD. Mod dil LA/RV. PASP 26mHg.  .Marland KitchenCOPD (chronic obstructive pulmonary disease) ([place]   . Coronary artery disease    a. 2013 S/P PCI of LAD ([facility];  b. 03/2013 PCI: RCA 90p ( 2.5 x 23 mm DES); c. 02/2014 Cath: patent RCA stent->Med Rx; d. 04/2017 Cath: LM nl, LAd 30ost/p, 613m, D1 80ost, LCX 90p/m, OM1 85, RCA 70/20p,  8021m0d->Referred for CT Surg-felt to be poor candidate.  . Diabetic neuropathy (HCCEsparto . Diabetic ulcer of right foot (HCCSt. Marys . Hernia   . Hyperlipidemia   . Hypertension   . Ischemic cardiomyopathy    a. 04/2017 Echo: EF 25-30%; b. 08/2017 Cardiac MRI ([facility]): EF 19%, sev glob HK. RVEF 20%, mod BAE, triv MR, mild-mod TR. Basal lateral subendocardial infarct (viable), inf/infsept, dist septal ischemia, basal to mid lat peri-infarct ischemia.  . Morbid obesity (HCCAllen . Neuropathy   . PAD (peripheral artery disease) (HCCPomona  a. Followed by Dr. DewLucky Cowboy. 08/2010 Periph Angio: RSFA 70-80p (6X60 self-expanding stent); c. 09/2010 Periph Angio: L SFA 100p (7x unknown length self-expanding stent); d. 06/2015 Periph Angio: R SFA short segment occlusion (6x12 self-expanding stent).  . Restless leg syndrome   . Sleep apnea   . Subclavian artery stenosis, right (HCCPasatiempo . Syncope and collapse   . Tobacco use    a. 75+ yr hx - still smoking 1ppd, down from 2 ppd.  . Type II diabetes mellitus (HCCTaylor . Varicose vein     Patient Active Problem List   Diagnosis Date Noted  . Chronic venous insufficiency 02/10/2020  . HFrEF (heart failure with reduced ejection fraction) (HCCCorbin8/22/2021  . COPD with acute exacerbation (HCCTreasure Island8/22/2021  . Atrial  fibrillation ([place]) 01/19/2020  . Chronic bilateral low back pain with sciatica 10/08/2019  . Medication management contract signed 10/08/2019  . Cellulitis of left lower leg 09/27/2019  . Cellulitis of foot, left 09/27/2019  . Hyponatremia 09/27/2019  . Cellulitis of left leg 08/31/2019  . CAD (coronary artery disease) 08/31/2019  . Acute on chronic systolic CHF (congestive heart failure) ([place]) 11/05/2018  . Open toe wound 10/01/2018  . Venous ulcer of left leg ([place]) 08/19/2018  . 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[place]) 04/28/2018  . Sepsis ([place]) 03/30/2018  . Abnormal MRI, shoulder (Right) 02/23/2018  . Abnormal MRI, cervical spine (2016)  02/23/2018  . DDD (degenerative disc disease), cervical 02/23/2018  . Cervical foraminal stenosis (Bilateral) 02/23/2018  . Cervical central spinal stenosis 02/23/2018  . Cervical facet hypertrophy 02/23/2018  . Arthralgia of acromioclavicular joint (Right) 02/23/2018  . Osteoarthritis of  AC (acromioclavicular) joint (Right) 02/23/2018  . Biceps tendinosis of shoulder (Right) 02/23/2018  . Tendinopathy of rotator cuff (Right) 02/23/2018  . Vitamin D deficiency 02/23/2018  . Atherosclerosis of native arteries of the extremities with ulceration ([place]) 02/15/2018  . Peripheral edema 02/05/2018  . Status post peripherally inserted central catheter (PICC) central line placement 02/04/2018  . Chronic lower extremity pain (Primary Area of Pain) (Right) 01/29/2018  . Chronic shoulder pain [facility] Area of Pain) (Right) 01/29/2018  . Chronic foot pain (Secondary Area of Pain) (Right) 01/29/2018  . Chronic pain syndrome 01/29/2018  . Long term current use of opiate analgesic 01/29/2018  . Medication monitoring encounter 01/29/2018  . Disorder of skeletal system 01/29/2018  . Problems influencing health status 01/29/2018  . AKI (acute kidney injury) ([place]) 01/03/2018  . NSTEMI (non-ST elevated myocardial infarction) ([place]) 11/21/2017  . Coronary artery disease of native artery of native heart with stable angina pectoris ([place]) 09/10/2017  . HLD (hyperlipidemia) 09/10/2017  . Angina pectoris ([place]) 09/08/2017  . Angina at rest [facility]) 08/13/2017  . Lymphedema 05/02/2017  . Chronic systolic CHF (congestive heart failure) ([place]) 04/23/2017  . Chest pain 07/11/2016  . Unstable angina ([place]) 07/11/2016  . Foot ulcer (Right)   . Stable angina (HCC)   . Pressure injury of skin 05/06/2016  . Diabetic foot infection ([place]) 05/05/2016  . Type 2 diabetes mellitus with other specified complication ([place]) 57/32/2025  . Cellulitis 11/21/2015  . Morbid obesity ([place]) 11/17/2014  . PAD (peripheral artery disease)  ([place])   . Hypertension   . Tobacco use   . COPD (chronic obstructive pulmonary disease) ([place]) 05/19/2012  . Diabetic neuropathy ([place]) 05/19/2012  . Peripheral vascular disease ([place]) 03/27/2012    Past Surgical History:  Procedure Laterality Date  . ABDOMINAL AORTAGRAM N/A 06/23/2013   Procedure: ABDOMINAL Maxcine Ham;  Surgeon: Wellington Hampshire, MD;  Location: [place] CATH LAB;  Service: Cardiovascular;  Laterality: N/A;  . AMPUTATION Right 04/01/2018   Procedure: AMPUTATION BELOW KNEE;  Surgeon: Algernon Huxley, MD;  Location: [facility] ORS;  Service: Vascular;  Laterality: Right;  . APPENDECTOMY    . CARDIAC CATHETERIZATION  10/14   [facility]; X1 STENT PROXIMAL RCA  . CARDIAC CATHETERIZATION  02/2010   [facility]  . CARDIAC CATHETERIZATION  [facility]/21/2014   [facility]  . CLAVICLE SURGERY    . HERNIA REPAIR    . IRRIGATION AND DEBRIDEMENT FOOT Right 01/04/2018   Procedure: IRRIGATION AND DEBRIDEMENT FOOT;  Surgeon: Samara Deist, DPM;  Location: [facility] ORS;  Service: Podiatry;  Laterality: Right;  . LEFT HEART CATH AND CORONARY ANGIOGRAPHY N/A 01/20/2020  Procedure: LEFT HEART CATH AND CORONARY ANGIOGRAPHY;  Surgeon: Wellington Hampshire, MD;  Location: [place] CV LAB;  Service: Cardiovascular;  Laterality: N/A;  . LEFT HEART CATH AND CORS/GRAFTS ANGIOGRAPHY N/A 04/28/2017   Procedure: LEFT HEART CATH AND CORONARY ANGIOGRAPHY;  Surgeon: Wellington Hampshire, MD;  Location: [place] CV LAB;  Service: Cardiovascular;  Laterality: N/A;  . LOWER EXTREMITY ANGIOGRAPHY Right 03/03/2018   Procedure: LOWER EXTREMITY ANGIOGRAPHY;  Surgeon: Katha Cabal, MD;  Location: [place] CV LAB;  Service: Cardiovascular;  Laterality: Right;  . LOWER EXTREMITY ANGIOGRAPHY Left 08/24/2018   Procedure: LOWER EXTREMITY ANGIOGRAPHY;  Surgeon: Algernon Huxley, MD;  Location: [place] CV LAB;  Service: Cardiovascular;  Laterality: Left;  . LOWER EXTREMITY ANGIOGRAPHY Left 06/14/2019   Procedure: LOWER EXTREMITY ANGIOGRAPHY;  Surgeon:  Algernon Huxley, MD;  Location: [place] CV LAB;  Service: Cardiovascular;  Laterality: Left;  . LOWER EXTREMITY ANGIOGRAPHY Left 08/02/2019   Procedure: LOWER EXTREMITY ANGIOGRAPHY;  Surgeon: Algernon Huxley, MD;  Location: [place] CV LAB;  Service: Cardiovascular;  Laterality: Left;  . PERIPHERAL ARTERIAL STENT GRAFT     x2 left/right  . PERIPHERAL VASCULAR BALLOON ANGIOPLASTY Right 01/06/2018   Procedure: PERIPHERAL VASCULAR BALLOON ANGIOPLASTY;  Surgeon: Katha Cabal, MD;  Location: [place] CV LAB;  Service: Cardiovascular;  Laterality: Right;  . PERIPHERAL VASCULAR CATHETERIZATION N/A 07/06/2015   Procedure: Abdominal Aortogram w/Lower Extremity;  Surgeon: Algernon Huxley, MD;  Location: [place] CV LAB;  Service: Cardiovascular;  Laterality: N/A;  . PERIPHERAL VASCULAR CATHETERIZATION  07/06/2015   Procedure: Lower Extremity Intervention;  Surgeon: Algernon Huxley, MD;  Location: [place] CV LAB;  Service: Cardiovascular;;    Prior to Admission medications   Medication Sig Start Date End Date Taking? Authorizing Provider  albuterol (VENTOLIN HFA) 108 (90 Base) MCG/ACT inhaler Inhale into the lungs every 6 (six) hours as needed for wheezing or shortness of breath.    [provider]  amiodarone (PACERONE) 200 MG tablet Take 200-400 mg by mouth as directed. Take 400 mg twice a day for 5 days then decrease to 200 mg twice a day.    Minna Merritts, MD  aspirin EC 81 MG tablet Take 1 tablet (81 mg total) by mouth daily. 05/11/15   Wellington Hampshire, MD  atorvastatin (LIPITOR) 80 MG tablet Take 80 mg by mouth daily.    [provider]  budesonide-formoterol (SYMBICORT) 80-4.5 MCG/ACT inhaler Inhale 2 puffs into the lungs as needed.    [provider]  cephALEXin (KEFLEX) 500 MG capsule Take 1 capsule (500 mg total) by mouth 3 (three) times daily. 02/10/20   Schnier, Dolores Lory, MD  clopidogrel (PLAVIX) 75 MG tablet Take 75 mg by mouth daily.  08/22/17    [provider]  collagenase (SANTYL) ointment Apply 1 application topically daily. 08/11/19   Kris Hartmann, NP  DULoxetine (CYMBALTA) 20 MG capsule Take 20 mg by mouth daily.    [provider]  ELIQUIS 5 MG TABS tablet Take 5 mg by mouth 2 (two) times daily. 02/02/20   [provider]  empagliflozin (JARDIANCE) 10 MG TABS tablet Take 10 mg by mouth daily.  02/03/20   [provider]  ezetimibe (ZETIA) 10 MG tablet TAKE 1 TABLET BY MOUTH ONCE DAILY Patient taking differently: Take 10 mg by mouth daily.  11/07/19   Alisa Graff, FNP  ezetimibe (ZETIA) 10 MG tablet Take 10 mg by mouth daily.  [provider]  gabapentin (NEURONTIN) 800 MG tablet Take 800 mg by mouth 4 (four) times daily.  08/14/17 02/29/20  [provider]  ipratropium-albuterol (DUONEB) 0.5-2.5 (3) MG/3ML SOLN Inhale 3 mLs into the lungs every 6 (six) hours as needed (wheezing/sob).     [provider]  isosorbide dinitrate (ISORDIL) 30 MG tablet Take by mouth.    [provider]  isosorbide mononitrate (IMDUR) 30 MG 24 hr tablet Take 30 mg by mouth daily.    [provider]  Lactobacillus Acidophilus POWD Take by mouth.    [provider]  levofloxacin (LEVAQUIN) 500 MG tablet Take 1 tablet (500 mg total) by mouth daily. 03/23/20   Kris Hartmann, NP  magnesium oxide (MAG-OX) 400 MG tablet Take 400 mg by mouth.  02/03/20   [provider]  metFORMIN (GLUCOPHAGE-XR) 500 MG 24 hr tablet Take 1,000 mg by mouth 2 (two) times daily.     [provider]  metolazone (ZAROXOLYN) 2.5 MG tablet  10/28/19   [provider]  metoprolol succinate (TOPROL-XL) 25 MG 24 hr tablet Take 1 tablet (25 mg total) by mouth daily. 07/02/19 08/29/20  Loel Dubonnet, NP  mometasone-formoterol (DULERA) 200-5 MCG/ACT AERO Inhale into the lungs.    [provider]  Multiple Vitamin (MULTI-VITAMINS) TABS Take 1 tablet by mouth daily.      [provider]  mupirocin ointment (BACTROBAN) 2 % SMARTSIG:1 Application Topical 2-3 Times Daily 03/13/20   [provider]  mupirocin ointment (BACTROBAN) 2 % APPLY TO AFFECTED AREA(s) 3 TIMES DAILY FOR OPEN SORES 03/14/20   [provider]  naloxone (NARCAN) 4 MG/0.1ML LIQD nasal spray kit Place into the nose. 03/03/20   [provider]  naproxen (NAPROSYN) 500 MG tablet  03/09/19   [provider]  NARCAN 4 MG/0.1ML LIQD nasal spray kit 1 spray once. 03/03/20   [provider]  nitroGLYCERIN (NITROSTAT) 0.4 MG SL tablet Place 1 tablet (0.4 mg total) under the tongue every 5 (five) minutes as needed for chest pain. 05/19/19   Alisa Graff, FNP  nystatin (MYCOSTATIN) 100000 UNIT/ML suspension Take 5 mLs by mouth 4 (four) times daily. 01/01/20   [provider]  omeprazole (PRILOSEC) 40 MG capsule Take 40 mg by mouth daily.    [provider]  Oxycodone HCl 10 MG TABS Take 10 mg by mouth every 6 (six) hours as needed (pain).  01/15/18   [provider]  pantoprazole (PROTONIX) 40 MG tablet Take 40 mg by mouth daily. 02/02/20   [provider]  potassium chloride (KLOR-CON) 10 MEQ tablet Take 1 tablet (10 mEq total) by mouth as directed. Take 1 tablet twice a day on days with metolazone take 4 tablets (40 mEq) 10/04/19 02/28/29  Minna Merritts, MD  potassium chloride in dextrose solution Take by mouth.    [provider]  promethazine (PHENERGAN) 25 MG tablet Take 25 mg by mouth every 6 (six) hours as needed. 01/01/20   [provider]  sacubitril-valsartan (ENTRESTO) 24-26 MG Take 1 tablet by mouth 2 (two) times daily. 09/30/19   Shahmehdi, Valeria Batman, MD  spironolactone (ALDACTONE) 25 MG tablet Take 1 tablet (25 mg total) by mouth daily. 10/01/19   Shahmehdi, Valeria Batman, MD  SYMBICORT 80-4.5 MCG/ACT inhaler Inhale 2 puffs into the lungs daily.  01/01/20   [provider]  torsemide (DEMADEX) 20  MG tablet Take 2 tablets (40 mg total) by mouth 2 (two) times  daily. 03/03/20 06/01/20  Minna Merritts, MD  traZODone (DESYREL) 150 MG tablet Take 150 mg by mouth at bedtime. 07/12/19   [provider]  umeclidinium bromide (INCRUSE ELLIPTA) 62.5 MCG/INH AEPB Inhale 1 puff into the lungs daily.  02/03/20   [provider]    Allergies Dulaglutide, Other, and Prednisone  Family History  Problem Relation Age of Onset  . Heart attack Father   . Heart disease Father   . Hypertension Father   . Hyperlipidemia Father   . Hypertension Mother   . Hyperlipidemia Mother     Social History Social History   Tobacco Use  . Smoking status: Current Every Day Smoker    Packs/day: 1.00    Years: 41.00    Pack years: 41.00    Types: Cigarettes  . Smokeless tobacco: Never Used  Vaping Use  . Vaping Use: Never used  Substance Use Topics  . Alcohol use: No  . Drug use: No    Review of Systems Constitutional: No fever/chills Eyes: No visual changes. ENT: No sore throat. Cardiovascular: Denies chest pain. Respiratory: Denies shortness of breath. Gastrointestinal: No abdominal pain.  No nausea, no vomiting.  No diarrhea. Genitourinary: Negative for dysuria. Musculoskeletal: Negative for acute arthralgias Skin: Negative for rash. Neurological: Negative for headaches, positive for weakness/numbness/paresthesias in right upper extremity Psychiatric: Negative for suicidal ideation/homicidal ideation   ____________________________________________   PHYSICAL EXAM:  VITAL SIGNS: ED Triage Vitals  Enc Vitals Group     BP 03/26/20 1534 (!) 73/48     Pulse Rate 03/26/20 1527 (!) 53     Resp 03/26/20 1527 18     Temp 03/26/20 1527 97.8 F (36.6 C)     Temp Source 03/26/20 1527 Oral     SpO2 03/26/20 1527 97 %     Weight 03/26/20 1527 203 lb (92.1 kg)     Height 03/26/20 1527 5' 7"  (1.702 m)     Head Circumference --      Peak Flow --      Pain Score 03/26/20 1527 9      Pain Loc --      Pain Edu? --      Excl. in [place]? --    Constitutional: Alert and oriented. Well appearing and in no acute distress. Eyes: Conjunctivae are normal. PERRL. Head: Atraumatic. Nose: No congestion/rhinnorhea. Mouth/Throat: Mucous membranes are moist. Neck: No stridor Cardiovascular: Grossly normal heart sounds.  Good peripheral circulation. Respiratory: Normal respiratory effort.  No retractions. Gastrointestinal: Soft and nontender. No distention. Musculoskeletal: No obvious deformities.  Tenderness to palpation over right lateral shoulder as well as painful range of motion Neurologic:  Normal speech and language. No gross focal neurologic deficits are appreciated. Skin:  Skin is warm and dry. No rash noted. Psychiatric: Mood and affect are normal. Speech and behavior are normal.  ____________________________________________   LABS (all labs ordered are listed, but only abnormal results are displayed)  Labs Reviewed  CBC WITH DIFFERENTIAL/PLATELET - Abnormal; Notable for the following components:      Result Value   RDW 18.6 (*)    All other components within normal limits  BASIC METABOLIC PANEL - Abnormal; Notable for the following components:   Sodium 133 (*)    Chloride 90 (*)    Glucose, Bld 144 (*)    BUN 22 (*)    All other components within normal limits   ED ECG REPORT I, Naaman Plummer, the attending physician, personally viewed and interpreted this ECG.  Date: 03/26/2020 EKG Time: 1533 Rate: 70 Rhythm: Atrial fibrillation QRS Axis: normal Intervals: RBBB ST/T Wave abnormalities: normal Narrative Interpretation: no evidence of acute ischemia   PROCEDURES  Procedure(s) performed (including Critical Care):  .1-3 Lead EKG Interpretation Performed by: Naaman Plummer, MD Authorized by: Naaman Plummer, MD     Interpretation: abnormal     ECG rate:  71   ECG rate assessment: normal     Rhythm: atrial fibrillation     Ectopy: none      Conduction: normal   Comments:     Right bundle branch block     ____________________________________________   INITIAL IMPRESSION / ASSESSMENT AND PLAN / ED COURSE  As part of my medical decision making, I reviewed the following data within the [place] notes reviewed and incorporated, Labs reviewed, EKG interpreted, Old chart reviewed, Radiograph reviewed and Notes from prior ED visits reviewed and incorporated        58 year old male with a history of 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 Given history, exam and workup I have low suspicion for fracture, dislocation, significant ligamentous injury, septic arthritis, gout flare, new autoimmune arthropathy, or gonococcal arthropathy.  Interventions: Shoulder brace Disposition: Discharge home with strict return precautions and instructions for prompt primary care follow up in the next week.      ____________________________________________   FINAL CLINICAL IMPRESSION(S) / ED DIAGNOSES  Final diagnoses:  Acute pain of right shoulder due to trauma     ED Discharge Orders         Ordered    Sling  Status:  Canceled       Comments: Right shoulder abduction sling/immobilizer   03/26/20 1753    Sling       Comments: Right shoulder abduction sling/immobilizer   03/26/20 1757           Note:  This document was prepared using Dragon voice recognition software and may include unintentional dictation errors.   Naaman Plummer, MD 03/26/20 [phone]

## 2020-03-26 NOTE — ED Triage Notes (Signed)
Pt to ED POV c/o right shoulder and bicep injury, states he was moving a box this morning and now his shoulder and bicep is numb.  States he has been seeing doctor for right shoulder injury that has needed surgery.  No obvious deformity.   Pt BP in triage 77/60, pt denies weakness, dizziness. States took BP meds this morning.

## 2020-03-28 ENCOUNTER — Encounter (INDEPENDENT_AMBULATORY_CARE_PROVIDER_SITE_OTHER): Payer: Self-pay | Admitting: Nurse Practitioner

## 2020-03-29 ENCOUNTER — Encounter (INDEPENDENT_AMBULATORY_CARE_PROVIDER_SITE_OTHER): Payer: Self-pay | Admitting: Nurse Practitioner

## 2020-03-29 NOTE — Progress Notes (Signed)
Subjective:    Patient ID: [name] [name], male    DOB: 04-17-1961, 59 y.o.   MRN: [id number] Chief Complaint  Patient presents with  . Follow-up    4 week unna boot check    Patient returns today for evaluation of lower extremity edema and cellulitis following Unna wraps.  Today the patient swelling appears to be much more under control in all ulcerations have healed.  The patient has some persistent redness that is concerning for some mild cellulitis.  Patient finished his antibiotics yesterday.  He denies any fever, chills, nausea, vomiting or diarrhea.  Overall he is doing much better.   Review of Systems  Cardiovascular: Positive for leg swelling.  All other systems reviewed and are negative.      Objective:   Physical Exam Vitals reviewed.  HENT:     Head: Normocephalic.  Cardiovascular:     Rate and Rhythm: Normal rate.     Pulses: Decreased pulses.  Pulmonary:     Effort: Pulmonary effort is normal.  Musculoskeletal:     Right Lower Extremity: Right leg is amputated below knee.  Neurological:     Mental Status: He is alert and oriented to person, place, and time.  Psychiatric:        Mood and Affect: Mood normal.        Behavior: Behavior normal.        Thought Content: Thought content normal.        Judgment: Judgment normal.     BP 104/72   Pulse 98   Ht $R'5\' 6"'cp$  (1.676 m)   Wt 202 lb (91.6 kg)   BMI 32.60 kg/m   Past Medical History:  Diagnosis Date  . Arthritis   . Asthma   . Atherosclerosis   . BPH (benign prostatic hyperplasia)   . Carotid arterial disease ([place])   . Charcot's joint of foot, right   . Chronic combined systolic (congestive) and diastolic (congestive) heart failure ([place])    a. 02/2013 EF 50% by LV gram; b. 04/2017 Echo: EF 25-30%. diff HK. Gr2 DD. Mod dil LA/RV. PASP 61mmHg.  [name] COPD (chronic obstructive pulmonary disease) ([place])   . Coronary artery disease    a. 2013 S/P PCI of LAD [facility]);  b. 03/2013 PCI: RCA 90p ( 2.5 x 23 mm DES);  c. 02/2014 Cath: patent RCA stent->Med Rx; d. 04/2017 Cath: LM nl, LAd 30ost/p, 13m/d, D1 80ost, LCX 90p/m, OM1 85, RCA 70/20p, 66m, 70d->Referred for CT Surg-felt to be poor candidate.  . Diabetic neuropathy ([place])   . Diabetic ulcer of right foot ([place])   . Hernia   . Hyperlipidemia   . Hypertension   . Ischemic cardiomyopathy    a. 04/2017 Echo: EF 25-30%; b. 08/2017 Cardiac MRI ([facility]): EF 19%, sev glob HK. RVEF 20%, mod BAE, triv MR, mild-mod TR. Basal lateral subendocardial infarct (viable), inf/infsept, dist septal ischemia, basal to mid lat peri-infarct ischemia.  . Morbid obesity ([place])   . Neuropathy   . PAD (peripheral artery disease) ([place])    a. Followed by Dr. Lucky Cowboy; b. 08/2010 Periph Angio: RSFA 70-80p (6X60 self-expanding stent); c. 09/2010 Periph Angio: L SFA 100p (7x unknown length self-expanding stent); d. 06/2015 Periph Angio: R SFA short segment occlusion (6x12 self-expanding stent).  . Restless leg syndrome   . Sleep apnea   . Subclavian artery stenosis, right ([place])   . Syncope and collapse   . Tobacco use    a. 75+ yr hx -  still smoking 1ppd, down from 2 ppd.  . Type II diabetes mellitus ([place])   . Varicose vein     Social History   Socioeconomic History  . Marital status: Single    Spouse name: Not on file  . Number of children: 0  . Years of education: 73  . Highest education level: 12th grade  Occupational History  . Occupation: on disability  Tobacco Use  . Smoking status: Current Every Day Smoker    Packs/day: 1.00    Years: 41.00    Pack years: 41.00    Types: Cigarettes  . Smokeless tobacco: Never Used  Vaping Use  . Vaping Use: Never used  Substance and Sexual Activity  . Alcohol use: No  . Drug use: No  . Sexual activity: Yes  Other Topics Concern  . Not on file  Social History Narrative   Lives at home in [place] with girlfriend. Independent at baseline.   Social Determinants of Health   Financial Resource Strain:   . Difficulty of Paying  Living Expenses: Not on file  Food Insecurity:   . Worried About Charity fundraiser in the Last Year: Not on file  . Ran Out of Food in the Last Year: Not on file  Transportation Needs:   . Lack of Transportation (Medical): Not on file  . Lack of Transportation (Non-Medical): Not on file  Physical Activity:   . Days of Exercise per Week: Not on file  . Minutes of Exercise per Session: Not on file  Stress:   . Feeling of Stress : Not on file  Social Connections:   . Frequency of Communication with Friends and Family: Not on file  . Frequency of Social Gatherings with Friends and Family: Not on file  . Attends Religious Services: Not on file  . Active Member of Clubs or Organizations: Not on file  . Attends Archivist Meetings: Not on file  . Marital Status: Not on file  Intimate Partner Violence:   . Fear of Current or Ex-Partner: Not on file  . Emotionally Abused: Not on file  . Physically Abused: Not on file  . Sexually Abused: Not on file    Past Surgical History:  Procedure Laterality Date  . ABDOMINAL AORTAGRAM N/A 06/23/2013   Procedure: ABDOMINAL Maxcine Ham;  Surgeon: Wellington Hampshire, MD;  Location: [place] CATH LAB;  Service: Cardiovascular;  Laterality: N/A;  . AMPUTATION Right 04/01/2018   Procedure: AMPUTATION BELOW KNEE;  Surgeon: Algernon Huxley, MD;  Location: [facility] ORS;  Service: Vascular;  Laterality: Right;  . APPENDECTOMY    . CARDIAC CATHETERIZATION  10/14   [facility]; X1 STENT PROXIMAL RCA  . CARDIAC CATHETERIZATION  02/2010   [facility]  . CARDIAC CATHETERIZATION  02/21/2014   [facility]  . CLAVICLE SURGERY    . HERNIA REPAIR    . IRRIGATION AND DEBRIDEMENT FOOT Right 01/04/2018   Procedure: IRRIGATION AND DEBRIDEMENT FOOT;  Surgeon: Samara Deist, DPM;  Location: [facility] ORS;  Service: Podiatry;  Laterality: Right;  . LEFT HEART CATH AND CORONARY ANGIOGRAPHY N/A 01/20/2020   Procedure: LEFT HEART CATH AND CORONARY ANGIOGRAPHY;  Surgeon: Wellington Hampshire, MD;  Location:  [place] CV LAB;  Service: Cardiovascular;  Laterality: N/A;  . LEFT HEART CATH AND CORS/GRAFTS ANGIOGRAPHY N/A 04/28/2017   Procedure: LEFT HEART CATH AND CORONARY ANGIOGRAPHY;  Surgeon: Wellington Hampshire, MD;  Location: [place] CV LAB;  Service: Cardiovascular;  Laterality: N/A;  . LOWER EXTREMITY ANGIOGRAPHY Right  03/03/2018   Procedure: LOWER EXTREMITY ANGIOGRAPHY;  Surgeon: Katha Cabal, MD;  Location: [place] CV LAB;  Service: Cardiovascular;  Laterality: Right;  . LOWER EXTREMITY ANGIOGRAPHY Left 08/24/2018   Procedure: LOWER EXTREMITY ANGIOGRAPHY;  Surgeon: Algernon Huxley, MD;  Location: [place] CV LAB;  Service: Cardiovascular;  Laterality: Left;  . LOWER EXTREMITY ANGIOGRAPHY Left 06/14/2019   Procedure: LOWER EXTREMITY ANGIOGRAPHY;  Surgeon: Algernon Huxley, MD;  Location: [place] CV LAB;  Service: Cardiovascular;  Laterality: Left;  . LOWER EXTREMITY ANGIOGRAPHY Left 08/02/2019   Procedure: LOWER EXTREMITY ANGIOGRAPHY;  Surgeon: Algernon Huxley, MD;  Location: [place] CV LAB;  Service: Cardiovascular;  Laterality: Left;  . PERIPHERAL ARTERIAL STENT GRAFT     x2 left/right  . PERIPHERAL VASCULAR BALLOON ANGIOPLASTY Right 01/06/2018   Procedure: PERIPHERAL VASCULAR BALLOON ANGIOPLASTY;  Surgeon: Katha Cabal, MD;  Location: [place] CV LAB;  Service: Cardiovascular;  Laterality: Right;  . PERIPHERAL VASCULAR CATHETERIZATION N/A 07/06/2015   Procedure: Abdominal Aortogram w/Lower Extremity;  Surgeon: Algernon Huxley, MD;  Location: [place] CV LAB;  Service: Cardiovascular;  Laterality: N/A;  . PERIPHERAL VASCULAR CATHETERIZATION  07/06/2015   Procedure: Lower Extremity Intervention;  Surgeon: Algernon Huxley, MD;  Location: [place] CV LAB;  Service: Cardiovascular;;    Family History  Problem Relation Age of Onset  . Heart attack Father   . Heart disease Father   . Hypertension Father   . Hyperlipidemia Father   . Hypertension Mother   .  Hyperlipidemia Mother     Allergies  Allergen Reactions  . Dulaglutide Anaphylaxis, Diarrhea and Hives  . Other Itching    Skin itching associated with nitro patch  . Prednisone Rash       Assessment & Plan:   1. Cellulitis of left lower leg The patient has a few areas that are concerning for cellulitis.  We will extend his current antibiotics for several days to ensure full treatment of his cellulitis.  2. Lymphedema Patient's lower extremity edema under good control today.  Patient is advised to transition to medical grade 1 compression stockings.  Is to be worn on a daily basis.  They should be placed first thing in the morning and removed before bedtime.  Patient is instructed not to sleep in medical grade 1 compression stockings.  The patient heart failure exacerbations are also frequently cause issues with his lymphedema therefore the patient is advised to remain consistent with heart failure directives and medications.  We will have the patient return in 3 months with his ABIs   Current Outpatient Medications on File Prior to Visit  Medication Sig Dispense Refill  . albuterol (VENTOLIN HFA) 108 (90 Base) MCG/ACT inhaler Inhale into the lungs every 6 (six) hours as needed for wheezing or shortness of breath.    [name] amiodarone (PACERONE) 200 MG tablet Take 200-400 mg by mouth as directed. Take 400 mg twice a day for 5 days then decrease to 200 mg twice a day.    [name] aspirin EC 81 MG tablet Take 1 tablet (81 mg total) by mouth daily. 90 tablet 3  . atorvastatin (LIPITOR) 80 MG tablet Take 80 mg by mouth daily.    . budesonide-formoterol (SYMBICORT) 80-4.5 MCG/ACT inhaler Inhale 2 puffs into the lungs as needed.    . cephALEXin (KEFLEX) 500 MG capsule Take 1 capsule (500 mg total) by mouth 3 (three) times daily. 30 capsule 1  . clopidogrel (PLAVIX) 75 MG tablet Take  75 mg by mouth daily.     . collagenase (SANTYL) ointment Apply 1 application topically daily. 90 g 1  . DULoxetine  (CYMBALTA) 20 MG capsule Take 20 mg by mouth daily.    [name] ELIQUIS 5 MG TABS tablet Take 5 mg by mouth 2 (two) times daily.    . empagliflozin (JARDIANCE) 10 MG TABS tablet Take 10 mg by mouth daily.     [name] ezetimibe (ZETIA) 10 MG tablet TAKE 1 TABLET BY MOUTH ONCE DAILY (Patient taking differently: Take 10 mg by mouth daily. ) 90 tablet 3  . ezetimibe (ZETIA) 10 MG tablet Take 10 mg by mouth daily.    [name] ipratropium-albuterol (DUONEB) 0.5-2.5 (3) MG/3ML SOLN Inhale 3 mLs into the lungs every 6 (six) hours as needed (wheezing/sob).     . isosorbide dinitrate (ISORDIL) 30 MG tablet Take by mouth.    . isosorbide mononitrate (IMDUR) 30 MG 24 hr tablet Take 30 mg by mouth daily.    . magnesium oxide (MAG-OX) 400 MG tablet Take 400 mg by mouth.     . metFORMIN (GLUCOPHAGE-XR) 500 MG 24 hr tablet Take 1,000 mg by mouth 2 (two) times daily.     . metolazone (ZAROXOLYN) 2.5 MG tablet     . metoprolol succinate (TOPROL-XL) 25 MG 24 hr tablet Take 1 tablet (25 mg total) by mouth daily. 15 tablet 5  . Multiple Vitamin (MULTI-VITAMINS) TABS Take 1 tablet by mouth daily.     . mupirocin ointment (BACTROBAN) 2 % APPLY TO AFFECTED AREA(s) 3 TIMES DAILY FOR OPEN SORES    . naloxone (NARCAN) 4 MG/0.1ML LIQD nasal spray kit Place into the nose.    . naproxen (NAPROSYN) 500 MG tablet     . nitroGLYCERIN (NITROSTAT) 0.4 MG SL tablet Place 1 tablet (0.4 mg total) under the tongue every 5 (five) minutes as needed for chest pain. 30 tablet 5  . nystatin (MYCOSTATIN) 100000 UNIT/ML suspension Take 5 mLs by mouth 4 (four) times daily.    [name] omeprazole (PRILOSEC) 40 MG capsule Take 40 mg by mouth daily.    . Oxycodone HCl 10 MG TABS Take 10 mg by mouth every 6 (six) hours as needed (pain).     . pantoprazole (PROTONIX) 40 MG tablet Take 40 mg by mouth daily.    . potassium chloride (KLOR-CON) 10 MEQ tablet Take 1 tablet (10 mEq total) by mouth as directed. Take 1 tablet twice a day on days with metolazone take 4 tablets (40  mEq) 90 tablet 3  . promethazine (PHENERGAN) 25 MG tablet Take 25 mg by mouth every 6 (six) hours as needed.    . sacubitril-valsartan (ENTRESTO) 24-26 MG Take 1 tablet by mouth 2 (two) times daily. 60 tablet 2  . spironolactone (ALDACTONE) 25 MG tablet Take 1 tablet (25 mg total) by mouth daily. 30 tablet 1  . SYMBICORT 80-4.5 MCG/ACT inhaler Inhale 2 puffs into the lungs daily.     [name] torsemide (DEMADEX) 20 MG tablet Take 2 tablets (40 mg total) by mouth 2 (two) times daily. 120 tablet 5  . traZODone (DESYREL) 150 MG tablet Take 150 mg by mouth at bedtime.    [name] umeclidinium bromide (INCRUSE ELLIPTA) 62.5 MCG/INH AEPB Inhale 1 puff into the lungs daily.     [name] gabapentin (NEURONTIN) 800 MG tablet Take 800 mg by mouth 4 (four) times daily.     . Lactobacillus Acidophilus POWD Take by mouth.    . mometasone-formoterol (DULERA) 200-5 MCG/ACT AERO  Inhale into the lungs.    . mupirocin ointment (BACTROBAN) 2 % SMARTSIG:1 Application Topical 2-3 Times Daily    . NARCAN 4 MG/0.1ML LIQD nasal spray kit 1 spray once.    . potassium chloride in dextrose solution Take by mouth.     No current facility-administered medications on file prior to visit.    There are no Patient Instructions on file for this visit. No follow-ups on file.   Kris Hartmann, NP

## 2020-04-03 NOTE — Progress Notes (Signed)
Date:  04/04/2020   ID:  [name], DOB 02-13-1961, MRN [id number]  Patient Location:  [place] [address]   Provider location:   [place], [place] office  PCP:  [name], MD  Cardiologist:  Arvid Right [facility]   Chief Complaint  Patient presents with  . office visit    1 month F/U-Patient reports worsening DOE; Meds verbally reviewed with patient.    History of Present Illness:    [name] [name] is a 59 y.o. 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,  PAD s/p prior SFA stenting 03/2013 s/p R BKA,  carotid arterial disease History of leaving the hospital AMA Who presents for follow-up of his severe acute on chronic heart failure, coronary disease stable angina PAD  In follow-up today reports he has fluid everywhere Reports compliance with his medications, did not bring a list with him Reports taking torsemide 40 in the morning and 20 in the afternoon Feels his belly is tight, legs edematous -Reports he did well in the past with Lasix IV infusion set up through the same day surgery at [facility] -Requesting this again but more regular Does not want to go to the emergency room Prefers to have IV Lasix tomorrow not today He continues to smoke  Prior history reviewed with him  hospital 01/2020, NSTEMI, atrial fib Markedly elevated troponin Hypotensive,  echocardiogram confirming ejection fraction less than 20% with global hypokinesis, Severe PAD Cardiac catheterization showing severe three-vessel disease, patent proximal LAD stent, moderate distal LAD disease Significant stenosis proximal left circumflex, bifurcation lesion,  heavily calcified, RCA stent patent, proximal RCA disease It was felt his cardiomyopathy is out of proportion to his coronary disease Management of his atrial fibrillation and heart failure symptoms felt to be more of a priority It was recommended he have pressors and diuresis Before this could be initiated, he left the hospital [facility] January 20, 2020  Admitted to [facility] 01/29/2020 Treated for CHF, left AMA August 25  Seen in the emergency room February 03, 2020 for blurred vision  Seen in clinic 02/29/2020  worsening swelling in his legs  compliant with his torsemide 40 twice daily Feels he does better on the IV Lasix, requesting IV   Seen in the ER 03/26/2020 Right shoulder  EKG personally reviewed by myself on todays visit Shows atrial fibrillation ventricular rate 68 bpm right bundle branch block  Other past medical history reviewed Presentation to the hospital May 2020 acute on chronic systolic CHF Treated with Lasix IV, transition to torsemide Toprol XL and spironolactone   with continued -ACEi has previously been held secondary to relative hypotension  LE stent placed 08/2018 Percutaneous transluminal angioplasty of the entire left SFA and above-knee popliteal artery with a 5 mm diameter by 30 cm length and a 5 mm diameter by 22 cm length Lutonix drug-coated angioplasty balloon  Prior CV studies:   The following studies were reviewed today:  2D Echo 11/06/2018: 1. The left ventricle has severely reduced systolic function, with an ejection fraction of 20-25%. The cavity size was moderately dilated. Indeterminate diastolic filling due to E-A fusion. Left ventricular diffuse hypokinesis. 2.  The right ventricle has severely reduced systolic function. The cavity was mildly enlarged. There is no increase in right ventricular wall thickness. Right ventricular systolic pressure is moderately elevated with an estimated pressure of 59.6 mmHg.   Past Medical History:  Diagnosis Date  .  Arthritis   . Asthma   . Atherosclerosis   . BPH (benign prostatic hyperplasia)   . Carotid arterial disease ([place])   . Charcot's joint of foot, right   . Chronic combined systolic (congestive) and diastolic (congestive) heart failure ([place])    a. 02/2013 EF 50% by LV gram; b. 04/2017 Echo: EF 25-30%. diff HK. Gr2 DD. Mod dil LA/RV. PASP 68mmHg.  [name] COPD (chronic obstructive pulmonary disease) ([place])   . Coronary artery disease    a. 2013 S/P PCI of LAD [facility]);  b. 03/2013 PCI: RCA 90p ( 2.5 x 23 mm DES); c. 02/2014 Cath: patent RCA stent->Med Rx; d. 04/2017 Cath: LM nl, LAd 30ost/p, 43m/d, D1 80ost, LCX 90p/m, OM1 85, RCA 70/20p, 95m, 70d->Referred for CT Surg-felt to be poor candidate.  . Diabetic neuropathy ([place])   . Diabetic ulcer of right foot ([place])   . Hernia   . Hyperlipidemia   . Hypertension   . Ischemic cardiomyopathy    a. 04/2017 Echo: EF 25-30%; b. 08/2017 Cardiac MRI ([facility]): EF 19%, sev glob HK. RVEF 20%, mod BAE, triv MR, mild-mod TR. Basal lateral subendocardial infarct (viable), inf/infsept, dist septal ischemia, basal to mid lat peri-infarct ischemia.  . Morbid obesity ([place])   . Neuropathy   . PAD (peripheral artery disease) ([place])    a. Followed by Dr. Lucky Cowboy; b. 08/2010 Periph Angio: RSFA 70-80p (6X60 self-expanding stent); c. 09/2010 Periph Angio: L SFA 100p (7x unknown length self-expanding stent); d. 06/2015 Periph Angio: R SFA short segment occlusion (6x12 self-expanding stent).  . Restless leg syndrome   . Sleep apnea   . Subclavian artery stenosis, right ([place])   . Syncope and collapse   . Tobacco use    a. 75+ yr hx - still smoking 1ppd, down from 2 ppd.  . Type II diabetes mellitus ([place])   . Varicose vein    Past Surgical History:  Procedure Laterality Date  . ABDOMINAL AORTAGRAM N/A 06/23/2013   Procedure: ABDOMINAL Maxcine Ham;  Surgeon: Wellington Hampshire, MD;  Location: [place] CATH LAB;  Service: Cardiovascular;  Laterality: N/A;  . AMPUTATION Right 04/01/2018   Procedure:  AMPUTATION BELOW KNEE;  Surgeon: Algernon Huxley, MD;  Location: [facility] ORS;  Service: Vascular;  Laterality: Right;  . APPENDECTOMY    . CARDIAC CATHETERIZATION  10/14   [facility]; X1 STENT PROXIMAL RCA  . CARDIAC CATHETERIZATION  02/2010   [facility]  . CARDIAC CATHETERIZATION  02/21/2014   [facility]  . CLAVICLE SURGERY    . HERNIA REPAIR    . IRRIGATION AND DEBRIDEMENT FOOT Right 01/04/2018   Procedure: IRRIGATION AND DEBRIDEMENT FOOT;  Surgeon: Samara Deist, DPM;  Location: [facility] ORS;  Service: Podiatry;  Laterality: Right;  . LEFT HEART CATH AND CORONARY ANGIOGRAPHY N/A 01/20/2020   Procedure: LEFT HEART CATH AND CORONARY ANGIOGRAPHY;  Surgeon: Wellington Hampshire, MD;  Location: [place] CV LAB;  Service: Cardiovascular;  Laterality: N/A;  . LEFT HEART CATH AND CORS/GRAFTS ANGIOGRAPHY N/A 04/28/2017   Procedure: LEFT HEART CATH AND CORONARY ANGIOGRAPHY;  Surgeon: Wellington Hampshire, MD;  Location: [place] CV LAB;  Service: Cardiovascular;  Laterality: N/A;  . LOWER EXTREMITY ANGIOGRAPHY Right 03/03/2018   Procedure: LOWER EXTREMITY ANGIOGRAPHY;  Surgeon: Renford Dills, MD;  Location: [place] INVASIVE CV LAB;  Service: Cardiovascular;  Laterality: Right;  . LOWER EXTREMITY ANGIOGRAPHY Left 08/24/2018   Procedure: LOWER EXTREMITY ANGIOGRAPHY;  Surgeon: Annice Needy, MD;  Location: [facility] INVASIVE CV LAB;  Service: Cardiovascular;  Laterality: Left;  . LOWER EXTREMITY ANGIOGRAPHY Left 06/14/2019   Procedure: LOWER EXTREMITY ANGIOGRAPHY;  Surgeon: Annice Needy, MD;  Location: [facility] INVASIVE CV LAB;  Service: Cardiovascular;  Laterality: Left;  . LOWER EXTREMITY ANGIOGRAPHY Left 08/02/2019   Procedure: LOWER EXTREMITY ANGIOGRAPHY;  Surgeon: Annice Needy, MD;  Location: [facility] INVASIVE CV LAB;  Service: Cardiovascular;  Laterality: Left;  . PERIPHERAL ARTERIAL STENT GRAFT     x2 left/right  . PERIPHERAL VASCULAR BALLOON ANGIOPLASTY Right 01/06/2018   Procedure: PERIPHERAL VASCULAR BALLOON ANGIOPLASTY;  Surgeon:  Renford Dills, MD;  Location: [facility] INVASIVE CV LAB;  Service: Cardiovascular;  Laterality: Right;  . PERIPHERAL VASCULAR CATHETERIZATION N/A 07/06/2015   Procedure: Abdominal Aortogram w/Lower Extremity;  Surgeon: Annice Needy, MD;  Location: [facility] INVASIVE CV LAB;  Service: Cardiovascular;  Laterality: N/A;  . PERIPHERAL VASCULAR CATHETERIZATION  07/06/2015   Procedure: Lower Extremity Intervention;  Surgeon: Annice Needy, MD;  Location: [facility] INVASIVE CV LAB;  Service: Cardiovascular;;     Current Meds  Medication Sig  . albuterol (VENTOLIN HFA) 108 (90 Base) MCG/ACT inhaler Inhale into the lungs every 6 (six) hours as needed for wheezing or shortness of breath.  [name] amiodarone (PACERONE) 200 MG tablet Take 200-400 mg by mouth as directed. Take 400 mg twice a day for 5 days then decrease to 200 mg twice a day.  [name] aspirin EC 81 MG tablet Take 1 tablet (81 mg total) by mouth daily.  [name] atorvastatin (LIPITOR) 80 MG tablet Take 80 mg by mouth daily.  . budesonide-formoterol (SYMBICORT) 80-4.5 MCG/ACT inhaler Inhale 2 puffs into the lungs as needed.  . cephALEXin (KEFLEX) 500 MG capsule Take 1 capsule (500 mg total) by mouth 3 (three) times daily.  . clopidogrel (PLAVIX) 75 MG tablet Take 75 mg by mouth daily.   . collagenase (SANTYL) ointment Apply 1 application topically daily.  . DULoxetine (CYMBALTA) 20 MG capsule Take 20 mg by mouth daily.  [name] ELIQUIS 5 MG TABS tablet Take 5 mg by mouth 2 (two) times daily.  . empagliflozin (JARDIANCE) 10 MG TABS tablet Take 10 mg by mouth daily.   [name] ezetimibe (ZETIA) 10 MG tablet Take 10 mg by mouth daily.  [name] ezetimibe (ZETIA) 10 MG tablet Take 10 mg by mouth daily.  [name] gabapentin (NEURONTIN) 800 MG tablet Take 800 mg by mouth 4 (four) times daily.   [name] ipratropium-albuterol (DUONEB) 0.5-2.5 (3) MG/3ML SOLN Inhale 3 mLs into the lungs every 6 (six) hours as needed (wheezing/sob).   . isosorbide dinitrate (ISORDIL) 30 MG tablet Take by mouth.  . isosorbide  mononitrate (IMDUR) 30 MG 24 hr tablet Take 30 mg by mouth daily.  . Lactobacillus Acidophilus POWD Take by mouth.  . levofloxacin (LEVAQUIN) 500 MG tablet Take 1 tablet (500 mg total) by mouth daily.  . magnesium oxide (MAG-OX) 400 MG tablet Take 400 mg by mouth.   . metFORMIN (GLUCOPHAGE-XR) 500 MG 24 hr tablet Take 1,000 mg by mouth 2 (two) times daily.   . metolazone (ZAROXOLYN) 2.5 MG tablet   . metoprolol succinate (TOPROL-XL) 25 MG 24 hr tablet Take 1 tablet (25 mg total) by mouth daily.  . mometasone-formoterol (DULERA) 200-5 MCG/ACT AERO Inhale into the  lungs.  . Multiple Vitamin (MULTI-VITAMINS) TABS Take 1 tablet by mouth daily.   . mupirocin ointment (BACTROBAN) 2 % SMARTSIG:1 Application Topical 2-3 Times Daily  . mupirocin ointment (BACTROBAN) 2 % APPLY TO AFFECTED AREA(s) 3 TIMES DAILY FOR OPEN SORES  . naloxone (NARCAN) 4 MG/0.1ML LIQD nasal spray kit Place into the nose.  . naproxen (NAPROSYN) 500 MG tablet   . NARCAN 4 MG/0.1ML LIQD nasal spray kit 1 spray once.  . nitroGLYCERIN (NITROSTAT) 0.4 MG SL tablet Place 1 tablet (0.4 mg total) under the tongue every 5 (five) minutes as needed for chest pain.  [name] nystatin (MYCOSTATIN) 100000 UNIT/ML suspension Take 5 mLs by mouth 4 (four) times daily.  [name] omeprazole (PRILOSEC) 40 MG capsule Take 40 mg by mouth daily.  . Oxycodone HCl 10 MG TABS Take 10 mg by mouth every 6 (six) hours as needed (pain).   . pantoprazole (PROTONIX) 40 MG tablet Take 40 mg by mouth daily.  . potassium chloride (KLOR-CON) 10 MEQ tablet Take 1 tablet (10 mEq total) by mouth as directed. Take 1 tablet twice a day on days with metolazone take 4 tablets (40 mEq)  . potassium chloride in dextrose solution Take by mouth.  . promethazine (PHENERGAN) 25 MG tablet Take 25 mg by mouth every 6 (six) hours as needed.  . sacubitril-valsartan (ENTRESTO) 24-26 MG Take 1 tablet by mouth 2 (two) times daily.  [name] spironolactone (ALDACTONE) 25 MG tablet Take 1 tablet (25 mg  total) by mouth daily.  . SYMBICORT 80-4.5 MCG/ACT inhaler Inhale 2 puffs into the lungs daily.   [name] torsemide (DEMADEX) 20 MG tablet Take 2 tablets (40 mg total) by mouth 2 (two) times daily.  . traZODone (DESYREL) 150 MG tablet Take 150 mg by mouth at bedtime.  [name] umeclidinium bromide (INCRUSE ELLIPTA) 62.5 MCG/INH AEPB Inhale 1 puff into the lungs daily.      Allergies:   Dulaglutide, Other, and Prednisone   Social History   Tobacco Use  . Smoking status: Current Every Day Smoker    Packs/day: 1.00    Years: 41.00    Pack years: 41.00    Types: Cigarettes  . Smokeless tobacco: Never Used  Vaping Use  . Vaping Use: Never used  Substance Use Topics  . Alcohol use: No  . Drug use: No     Family Hx: The patient's family history includes Heart attack in his father; Heart disease in his father; Hyperlipidemia in his father and mother; Hypertension in his father and mother.  ROS:   Please see the history of present illness.    Review of Systems  Constitutional: Negative.   HENT: Negative.   Respiratory: Positive for shortness of breath.   Cardiovascular: Positive for leg swelling.  Gastrointestinal: Negative.        Abdominal distention  Musculoskeletal: Negative.   Neurological: Negative.   Psychiatric/Behavioral: Negative.   All other systems reviewed and are negative.    Labs/Other Tests and Data Reviewed:    Recent Labs: 09/17/2019: ALT 10 09/28/2019: Magnesium 1.7 01/19/2020: B Natriuretic Peptide 1,511.7 03/26/2020: BUN 22; Creatinine, Ser 1.20; Hemoglobin 13.3; Platelets 177; Potassium 4.1; Sodium 133   Recent Lipid Panel Lab Results  Component Value Date/Time   CHOL 118 01/20/2020 03:52 AM   CHOL 211 (H) 04/05/2013 05:20 AM   TRIG 37 01/20/2020 03:52 AM   TRIG 321 (H) 04/05/2013 05:20 AM   HDL 34 (L) 01/20/2020 03:52 AM   HDL 34 (L) 04/05/2013 05:20 AM  CHOLHDL 3.5 01/20/2020 03:52 AM   LDLCALC 77 01/20/2020 03:52 AM   LDLCALC 113 (H) 04/05/2013 05:20 AM      Wt Readings from Last 3 Encounters:  04/04/20 217 lb (98.4 kg)  03/26/20 203 lb (92.1 kg)  03/23/20 202 lb (91.6 kg)     Exam:    Vital Signs: Vital signs may also be detailed in the HPI BP 100/70 (BP Location: Right Arm, Patient Position: Sitting, Cuff Size: Normal)   Pulse 68   Ht $R'5\' 7"'Ct$  (1.702 m)   Wt 217 lb (98.4 kg)   SpO2 92%   BMI 33.99 kg/m   Constitutional:  oriented to person, place, and time. No distress.  HENT:  Head: Grossly normal Eyes:  no discharge. No scleral icterus.  Neck:  JVD 10+, no carotid bruits  Cardiovascular: Regular rate and rhythm, no murmurs appreciated 2+ pitting woody edema, abdomen distended Pulmonary/Chest: Clear to auscultation bilaterally, no wheezes or rails Abdominal: Firm,  + distension.  no tenderness.  Musculoskeletal: Normal range of motion, prosthesis right lower extremity Neurological:  normal muscle tone. Coordination normal. No atrophy Skin: Skin warm and dry Psychiatric: normal affect, pleasant   ASSESSMENT & PLAN:     Chronic systolic HF (heart failure) (HCC) - Recommend he continue torsemide 40 twice daily We will attempt to schedule Lasix 40 IV with 40 potassium tomorrow, then will try to set up 3 times a week Symptoms likely exacerbated by atrial fibrillation in the setting of severe cardiomyopathy Stressed importance of fluid and medication compliance -We will look into home health services for IV Lasix to minimize trips to the emergency room  Coronary artery disease of native artery of native heart with stable angina pectoris (HCC) - Previously declined CABG Recent catheterization, left AMA  Was at [facility], also left AMA Denies unstable angina symptoms  Type 2 diabetes mellitus with foot ulcer, with long-term current use of insulin ([place])  Followed by vascular, has had amputation on the right High risk of further disease given continued smoking Smoking cessation recommended  Persistent atrial  fibrillation Unclear medication compliance with Eliquis and amiodarone Did not bring a list of his medications, unclear what he has been feeling and taking On prior office visit was started on amiodarone, recommended strict compliance with Eliquis Girlfriend who presents with him is unclear what he is taking We will hold refills of amiodarone at this time, Needs to reach euvolemic state prior to any attempt to restore normal sinus rhythm  Atherosclerosis of artery of extremity with ulceration (HCC) -  Followed by vascular, prior stent left SFA Amputation on the right Still smoking, cessation recommended Stable  Hypotension Blood pressure stable Denies orthostasis symptoms  PAD (peripheral artery disease) ([place]) -  Smoking cessation recommended Does not want Chantix  Mixed hyperlipidemia -  Last cholesterol at goal   Total encounter time more than 45 minutes  Greater than 50% was spent in counseling and coordination of care with the patient   Signed, Ida Rogue, MD  04/04/2020 4:34 PM    [place] [facility] [address], [place], [place] [address]

## 2020-04-04 ENCOUNTER — Telehealth (HOSPITAL_COMMUNITY): Payer: Self-pay

## 2020-04-04 ENCOUNTER — Ambulatory Visit (INDEPENDENT_AMBULATORY_CARE_PROVIDER_SITE_OTHER): Payer: Medicare Other | Admitting: Cardiovascular Disease

## 2020-04-04 ENCOUNTER — Other Ambulatory Visit: Payer: Self-pay

## 2020-04-04 ENCOUNTER — Telehealth: Payer: Self-pay | Admitting: Cardiovascular Disease

## 2020-04-04 ENCOUNTER — Encounter: Payer: Self-pay | Admitting: Cardiovascular Disease

## 2020-04-04 ENCOUNTER — Other Ambulatory Visit: Payer: Self-pay | Admitting: Cardiovascular Disease

## 2020-04-04 VITALS — BP 100/70 | HR 68 | Ht 67.0 in | Wt 217.0 lb

## 2020-04-04 DIAGNOSIS — E1159 Type 2 diabetes mellitus with other circulatory complications: Secondary | ICD-10-CM | POA: Diagnosis not present

## 2020-04-04 DIAGNOSIS — I25118 Atherosclerotic heart disease of native coronary artery with other forms of angina pectoris: Secondary | ICD-10-CM

## 2020-04-04 DIAGNOSIS — I739 Peripheral vascular disease, unspecified: Secondary | ICD-10-CM | POA: Diagnosis not present

## 2020-04-04 DIAGNOSIS — Z794 Long term (current) use of insulin: Secondary | ICD-10-CM

## 2020-04-04 DIAGNOSIS — I5022 Chronic systolic (congestive) heart failure: Secondary | ICD-10-CM | POA: Diagnosis not present

## 2020-04-04 DIAGNOSIS — Z720 Tobacco use: Secondary | ICD-10-CM

## 2020-04-04 DIAGNOSIS — I5043 Acute on chronic combined systolic (congestive) and diastolic (congestive) heart failure: Secondary | ICD-10-CM

## 2020-04-04 DIAGNOSIS — E785 Hyperlipidemia, unspecified: Secondary | ICD-10-CM

## 2020-04-04 DIAGNOSIS — I10 Essential (primary) hypertension: Secondary | ICD-10-CM

## 2020-04-04 NOTE — Telephone Encounter (Signed)
Attempted several times to reach, unable to leave a message.  Will continue to try.   [place] [phone]

## 2020-04-04 NOTE — Telephone Encounter (Signed)
No answer/Voicemail states "Please enter your remote access code"

## 2020-04-04 NOTE — Patient Instructions (Addendum)
Same day surgery Lasix IV bolus 3x a week with potassium  BMP in 2-3 weeks at same day surgery   Medication Instructions:  No changes  If you need a refill on your cardiac medications before your next appointment, please call your pharmacy.    Lab work: BMP in 2 weeks   If you have labs (blood work) drawn today and your tests are completely normal, you will receive your results only by: [name] MyChart Message (if you have MyChart) OR . A paper copy in the mail If you have any lab test that is abnormal or we need to change your treatment, we will call you to review the results.   Testing/Procedures: No new testing needed   Follow-Up: At [facility], you and your health needs are our priority.  As part of our continuing mission to provide you with exceptional heart care, we have created designated Provider Care Teams.  These Care Teams include your primary Cardiologist (physician) and Advanced Practice Providers (APPs -  Physician Assistants and Nurse Practitioners) who all work together to provide you with the care you need, when you need it.  . You will need a follow up appointment in 1 month  . Providers on your designated Care Team:   . Murray Hodgkins, NP . Christell Faith, PA-C . Marrianne Mood, PA-C  Any Other Special Instructions Will Be Listed Below (If Applicable).  COVID-19 Vaccine Information can be found at: ShippingScam.co.uk For questions related to vaccine distribution or appointments, please email vaccine@[email or web address] .com or call [phone].

## 2020-04-05 ENCOUNTER — Ambulatory Visit: Admission: RE | Admit: 2020-04-05 | Payer: Medicare Other | Source: Ambulatory Visit

## 2020-04-05 ENCOUNTER — Telehealth: Payer: Self-pay | Admitting: Cardiovascular Disease

## 2020-04-05 ENCOUNTER — Other Ambulatory Visit: Payer: Self-pay | Admitting: Cardiovascular Disease

## 2020-04-05 DIAGNOSIS — I5043 Acute on chronic combined systolic (congestive) and diastolic (congestive) heart failure: Secondary | ICD-10-CM

## 2020-04-05 NOTE — Telephone Encounter (Signed)
The patient's wife called back this morning after I originally spoke with the patient. She stated to scheduling that the patient threw up yesterday and then again this morning. She stated she had recently had a "virus" herself.  I called same day surgery to cancel his IV lasix for today, because the patient's wife told scheduling he would not be able to make it.  I called the patient and his wife back and the patient proceeded to tell me he thinks he is throwing up from too much fluid on board.  I advised him that I agree this is most likely the case, but in light of the fact that his wife just had a virus, we will call him back in the morning to re-evaluate how he is doing.  If he is afebrile, which he has been and feels he is strong enough to come in, we will reschedule him for a time to come in on Thursday for IV lasix.  I inquired from the patient if we could set up home health to come in and give him IV lasix, would he be agreeable to this and he advised he would. I have made him aware that coming in to the same day surgery center this week will be the quickest way to help him at this time and he was fine with that.   The patient is aware I will forward a message to Dr. Donivan Scull nurse to please reach out to him tomorrow morning and re-evaluate how he is doing and we will go from there.  The patient voices understanding and is agreeable.   Dr. Rockey Situ was made aware of the above.

## 2020-04-05 NOTE — Telephone Encounter (Signed)
I was advised by Dr. Rockey Situ his nurse, Pam yesterday evening after hours that this patient was needing to come in today for IV lasix in same day surgery.  I spoke with Maykala in same day surgery this morning, as she called the clinic to inquire which patient needed to come in.  Per Maykala, the patient can come in at 10:30 am today (arrrive at 10:15 am at registration).   I discussed with Maykala that the patient should be on a 3x/ week rotation for lasix per Dr. Rockey Situ.  She did go ahead and schedule the patient for IV lasix this morning, Friday, & Monday at 10:30 am.   I have notified the patient about his appointment today, Friday & Monday and he is agreeable.  He is aware Dr. Donivan Scull nurse will touch base with him later this week/ early next week about further appointments for IV lasix.  The patient voices understanding and is agreeable.

## 2020-04-06 ENCOUNTER — Telehealth (HOSPITAL_COMMUNITY): Payer: Self-pay

## 2020-04-06 NOTE — Telephone Encounter (Signed)
Spoke with patient and he denied any symptoms today so advised that I would call to try and get appointment for today. Called short stay to see if he could come in today but they did not have appointment available but did have him scheduled for tomorrow at 10:30 AM tomorrow. Called patient back and confirmed appointment for tomorrow. Advised to arrive a little early to get through screening and registration. Advised that I would be working on possibly home health care to assist with these infusions and would call once I get some information on these services. Patient verbalized understanding, plan of care, and had no further questions at this time.

## 2020-04-06 NOTE — Telephone Encounter (Signed)
See other telephone note on 04/05/20 by Nira Conn.

## 2020-04-06 NOTE — Telephone Encounter (Signed)
Attempted several times in past 2 months to reach.  Attempted all phone numbers listed.  Unable to leave messages.   [place] [phone]

## 2020-04-07 ENCOUNTER — Other Ambulatory Visit: Payer: Self-pay

## 2020-04-07 ENCOUNTER — Ambulatory Visit
Admission: RE | Admit: 2020-04-07 | Discharge: 2020-04-07 | Disposition: A | Discharge status: Home / Self Care | Payer: Medicare Other | Source: Ambulatory Visit | Attending: Cardiovascular Disease | Admitting: Cardiovascular Disease

## 2020-04-07 DIAGNOSIS — I5043 Acute on chronic combined systolic (congestive) and diastolic (congestive) heart failure: Secondary | ICD-10-CM

## 2020-04-07 MED ORDER — FUROSEMIDE 10 MG/ML IJ SOLN
INTRAMUSCULAR | Status: AC
  Administered 2020-04-07: 80 mg via INTRAVENOUS
  Filled 2020-04-07: qty 8

## 2020-04-07 MED ORDER — POTASSIUM CHLORIDE CRYS ER 20 MEQ PO TBCR
40.0000 meq | EXTENDED_RELEASE_TABLET | Freq: Once | ORAL | Status: AC
  Administered 2020-04-07: 40 meq via ORAL

## 2020-04-07 MED ORDER — POTASSIUM CHLORIDE CRYS ER 20 MEQ PO TBCR
EXTENDED_RELEASE_TABLET | ORAL | Status: AC
  Filled 2020-04-07: qty 2

## 2020-04-07 MED ORDER — FUROSEMIDE 10 MG/ML IJ SOLN: 80.0000 mg | Freq: Once | INTRAMUSCULAR | Status: AC

## 2020-04-07 NOTE — Discharge Instructions (Signed)

## 2020-04-10 ENCOUNTER — Ambulatory Visit: Admission: RE | Admit: 2020-04-10 | Payer: Medicare Other | Source: Ambulatory Visit

## 2020-04-10 ENCOUNTER — Telehealth: Payer: Self-pay | Admitting: Cardiovascular Disease

## 2020-04-10 NOTE — Telephone Encounter (Signed)
Patient's girlfriend is calling and asks if patient was supposed to have Lasix injection this morning. Please call to discuss.

## 2020-04-10 NOTE — Telephone Encounter (Signed)
I placed call to number listed for call back but voicemail has not been set up.  Unable to leave a message

## 2020-04-11 ENCOUNTER — Telehealth (INDEPENDENT_AMBULATORY_CARE_PROVIDER_SITE_OTHER): Payer: Self-pay

## 2020-04-11 NOTE — Telephone Encounter (Addendum)
Patient left voicemail stating that he was advise by Elta Guadeloupe with [employer] that needed a appointment to come in so he can receive his new prothesis. Patient appointment was made with patient friend Ms [name] on his contact list.

## 2020-04-11 NOTE — Telephone Encounter (Signed)
Lm for pt to call back unable to leave message with pt's girlfriend voice mail not set up .Adonis Housekeeper

## 2020-04-11 NOTE — Telephone Encounter (Signed)
Patient is returning your call.  

## 2020-04-12 ENCOUNTER — Other Ambulatory Visit: Payer: Self-pay | Admitting: Family

## 2020-04-12 ENCOUNTER — Other Ambulatory Visit: Payer: Self-pay

## 2020-04-12 ENCOUNTER — Encounter: Payer: Self-pay | Admitting: Family

## 2020-04-12 ENCOUNTER — Ambulatory Visit
Admission: RE | Admit: 2020-04-12 | Discharge: 2020-04-12 | Disposition: A | Discharge status: Home / Self Care | Payer: Medicare Other | Source: Ambulatory Visit | Attending: Family | Admitting: Family

## 2020-04-12 ENCOUNTER — Ambulatory Visit: Payer: Medicare Other | Admitting: Family

## 2020-04-12 VITALS — BP 113/88 | HR 101 | Resp 18 | Ht 67.0 in | Wt 219.0 lb

## 2020-04-12 DIAGNOSIS — Z89511 Acquired absence of right leg below knee: Secondary | ICD-10-CM | POA: Insufficient documentation

## 2020-04-12 DIAGNOSIS — I110 Hypertensive heart disease with heart failure: Secondary | ICD-10-CM | POA: Insufficient documentation

## 2020-04-12 DIAGNOSIS — I70299 Other atherosclerosis of native arteries of extremities, unspecified extremity: Secondary | ICD-10-CM

## 2020-04-12 DIAGNOSIS — I5043 Acute on chronic combined systolic (congestive) and diastolic (congestive) heart failure: Secondary | ICD-10-CM | POA: Insufficient documentation

## 2020-04-12 DIAGNOSIS — F17210 Nicotine dependence, cigarettes, uncomplicated: Secondary | ICD-10-CM | POA: Insufficient documentation

## 2020-04-12 DIAGNOSIS — I2510 Atherosclerotic heart disease of native coronary artery without angina pectoris: Secondary | ICD-10-CM | POA: Insufficient documentation

## 2020-04-12 DIAGNOSIS — E1151 Type 2 diabetes mellitus with diabetic peripheral angiopathy without gangrene: Secondary | ICD-10-CM | POA: Diagnosis not present

## 2020-04-12 DIAGNOSIS — Z955 Presence of coronary angioplasty implant and graft: Secondary | ICD-10-CM | POA: Insufficient documentation

## 2020-04-12 DIAGNOSIS — Z79899 Other long term (current) drug therapy: Secondary | ICD-10-CM | POA: Diagnosis not present

## 2020-04-12 DIAGNOSIS — Z720 Tobacco use: Secondary | ICD-10-CM

## 2020-04-12 DIAGNOSIS — I10 Essential (primary) hypertension: Secondary | ICD-10-CM

## 2020-04-12 DIAGNOSIS — L97909 Non-pressure chronic ulcer of unspecified part of unspecified lower leg with unspecified severity: Secondary | ICD-10-CM

## 2020-04-12 DIAGNOSIS — I5023 Acute on chronic systolic (congestive) heart failure: Secondary | ICD-10-CM

## 2020-04-12 DIAGNOSIS — E1159 Type 2 diabetes mellitus with other circulatory complications: Secondary | ICD-10-CM

## 2020-04-12 LAB — BRAIN NATRIURETIC PEPTIDE: B Natriuretic Peptide: 2512.7 pg/mL — ABNORMAL HIGH (ref 0.0–100.0)

## 2020-04-12 LAB — BASIC METABOLIC PANEL
Anion gap: 9 (ref 5–15)
BUN: 19 mg/dL (ref 6–20)
CO2: 28 mmol/L (ref 22–32)
Calcium: 8.9 mg/dL (ref 8.9–10.3)
Chloride: 94 mmol/L — ABNORMAL LOW (ref 98–111)
Creatinine, Ser: 1.23 mg/dL (ref 0.61–1.24)
GFR, Estimated: >60 mL/min (ref >60)
Glucose, Bld: 191 mg/dL — ABNORMAL HIGH (ref 70–99)
Potassium: 4.3 mmol/L (ref 3.5–5.1)
Sodium: 131 mmol/L — ABNORMAL LOW (ref 135–145)

## 2020-04-12 LAB — GLUCOSE, CAPILLARY: Glucose-Capillary: 186 mg/dL — ABNORMAL HIGH (ref 70–99)

## 2020-04-12 MED ORDER — POTASSIUM CHLORIDE CRYS ER 20 MEQ PO TBCR
EXTENDED_RELEASE_TABLET | ORAL | Status: AC
  Administered 2020-04-12: 40 meq via ORAL
  Filled 2020-04-12: qty 2

## 2020-04-12 MED ORDER — FUROSEMIDE 10 MG/ML IJ SOLN
INTRAMUSCULAR | Status: AC
  Administered 2020-04-12: 80 mg via INTRAVENOUS
  Filled 2020-04-12: qty 8

## 2020-04-12 MED ORDER — FUROSEMIDE 10 MG/ML IJ SOLN: 80.0000 mg | Freq: Once | INTRAMUSCULAR | Status: AC

## 2020-04-12 MED ORDER — POTASSIUM CHLORIDE CRYS ER 20 MEQ PO TBCR: 40.0000 meq | EXTENDED_RELEASE_TABLET | Freq: Once | ORAL | Status: AC

## 2020-04-12 NOTE — Progress Notes (Signed)
Patient ID: [name] [name], male    DOB: [birth year], 59 y.o.   MRN: [id number]  HPI  [name] is a 59 y/o male with a history of obstructive sleep apnea, PVD, PAD, HTN, hyperlipidemia, DM, CAD, COPD, BPH, asthma, current tobacco use and chronic heart failure.   Echo report from 01/20/20 reviewed and showed an EF of <20% along with mild MR and moderate TR. Echo report from 09/29/19 reviewed and showed an EF of 20-25% along with moderately elevated PA pressure, mild MR and mild/moderate TR. Echo report from 11/06/2018 reviewed and showed an EF of 20-25% along with moderate TR and a PA pressure of 59.6 mmHg. Echo report from 04/25/17 reviewed and shows an EF of 25-30% along with a PA pressure of 34 mm Hg. EF has declined from 55-60% back in 2016.   LHC done 01/20/20 showed:  Prox RCA-1 lesion is 80% stenosed.  Prox RCA-2 lesion is 20% stenosed.  Prox RCA to Mid RCA lesion is 80% stenosed.  Prox LAD lesion is 20% stenosed.  1st Diag lesion is 80% stenosed.  Mid LAD lesion is 20% stenosed.  Dist LAD lesion is 60% stenosed.  Prox Cx to Mid Cx lesion is 90% stenosed.  LPAV lesion is 95% stenosed.   1. Significant underlying three-vessel coronary artery disease. Patent proximal LAD stent with mild in-stent restenosis. Moderate diffuse disease in the distal LAD. Significant stenosis in the proximal left circumflex at the origin of the posterior AV groove artery which is also heavily diseased at the ostium. This is a bifurcation lesion and heavily calcified. RCA stent is patent. However, there is significant proximal disease in the whole mid to distal segment is diffusely diseased and small caliber. 2. Left ventricular angiography was not performed. EF was severe reduced by echo. 3. Moderately elevated left ventricular end-diastolic pressure at 28 mmHg  Cardiac catheterization done 04/28/17 showed significant three-vessel disease with a patient stent in the RCA and LAD. Significant proximal RCS  disease and the stent as well as diffuse mid and distal disease. LAD has moderate disease. Severely elevated left ventricular end-diastolic pressure at 34 mmHg. Possible CABG in the future with optimizing medical management. Stress done in 2015.  Was in the ED 03/26/20 due to right shoulder pain where he was evaluated and released.   He presents today for an acute visit with a chief complaint of moderate shortness of breath with minimal exertion. Says that this is chronic in nature having been present for several years although has worsened over the last week. He has associated fatigue, cough, left leg swelling, abdominal distention, light-headedness, easy bruising, weight gain and difficulty sleeping along with this.   He has an adjustable bed and is sleeping between 45-90 degree angle.   Was supposed to have received IV lasix 2 days ago but went out of town with a friend instead.   Past Medical History:  Diagnosis Date  . Arthritis   . Asthma   . Atherosclerosis   . BPH (benign prostatic hyperplasia)   . Carotid arterial disease ([place])   . Charcot's joint of foot, right   . Chronic combined systolic (congestive) and diastolic (congestive) heart failure ([place])    a. 02/2013 EF 50% by LV gram; b. 04/2017 Echo: EF 25-30%. diff HK. Gr2 DD. Mod dil LA/RV. PASP 34mHg.  .Marland KitchenCOPD (chronic obstructive pulmonary disease) ([place]   . Coronary artery disease    a. 2013 S/P PCI of LAD ([facility];  b. 03/2013 PCI: RCA  90p ( 2.5 x 23 mm DES); c. 02/2014 Cath: patent RCA stent->Med Rx; d. 04/2017 Cath: LM nl, LAd 30ost/p, 10md, D1 80ost, LCX 90p/m, OM1 85, RCA 70/20p, 838m70d->Referred for CT Surg-felt to be poor candidate.  . Diabetic neuropathy (HCI[place]  . Diabetic ulcer of right foot ([facility]  . Hernia   . Hyperlipidemia   . Hypertension   . Ischemic cardiomyopathy    a. 04/2017 Echo: EF 25-30%; b. 08/2017 Cardiac MRI ([facility]): EF 19%, sev glob HK. RVEF 20%, mod BAE, triv MR, mild-mod TR. Basal lateral  subendocardial infarct (viable), inf/infsept, dist septal ischemia, basal to mid lat peri-infarct ischemia.  . Morbid obesity ([facility]  . Neuropathy   . PAD (peripheral artery disease) ([place]   a. Followed by Dr. DeLucky Cowboyb. 08/2010 Periph Angio: RSFA 70-80p (6X60 self-expanding stent); c. 09/2010 Periph Angio: L SFA 100p (7x unknown length self-expanding stent); d. 06/2015 Periph Angio: R SFA short segment occlusion (6x12 self-expanding stent).  . Restless leg syndrome   . Sleep apnea   . Subclavian artery stenosis, right ([facility]fle[facility]  . Syncope and collapse   . Tobacco use    a. 75+ yr hx - still smoking 1ppd, down from 2 ppd.  . Type II diabetes mellitus (HCHern[place]  . Varicose vein    Past Surgical History:  Procedure Laterality Date  . ABDOMINAL AORTAGRAM N/A 06/23/2013   Procedure: ABDOMINAL AOMaxcine Ham Surgeon: MuWellington HampshireMD;  [facility];  Service: Cardiovascular;  Laterality: N/A;  . AMPUTATION Right 04/01/2018   Procedure: AMPUTATION BELOW KNEE;  Surgeon: DeAlgernon HuxleyMD;  Location[facility];  Service: Vascular;  Laterality: Right;  . APPENDECTOMY    . CARDIAC CATHETERIZATION  10/14   [facility]; X1 STENT PROXIMAL RCA  . CARDIAC CATHETERIZATION  02/2010   [facility]  . CARDIAC CAT[facility]IZATION  02/21/2014   [facility]  . CLAVICLE SURGERY    . HERNIA REPAIR    . IRRIGATION AND DEBRIDEMENT FOOT Right 01/04/2018   Procedure: IRRIGATION AND DEBRIDEMENT FOOT;  Surgeon: FoSamara DeistDPM;  Location: [facility];  Service: Podiatry;  Laterality: Right;  . LEFT HEART CATH AND CORONARY ANGIOGRAPHY N/A 01/20/2020   Procedure: LEFT HEART CATH AND CORONARY ANGIOGRAPHY;  Surgeon: ArWellington HampshireMD;  [facility];  Service: Cardiovascular;  Laterality: N/A;  . LEFT HEART CATH AND CORS/GRAFTS ANGIOGRAPHY N/A 04/28/2017   Procedure: LEFT HEART CATH AND CORONARY ANGIOGRAPHY;  Surgeon: ArWellington HampshireMD;  [facility];  Service: Cardiovascular;  Laterality: N/A;  . LOWER  EXTREMITY ANGIOGRAPHY Right 03/03/2018   Procedure: LOWER EXTREMITY ANGIOGRAPHY;  Surgeon: ScKatha CabalMD;  [facility];  Service: Cardiovascular;  Laterality: Right;  . LOWER EXTREMITY ANGIOGRAPHY Left 08/24/2018   Procedure: LOWER EXTREMITY ANGIOGRAPHY;  Surgeon: DeAlgernon HuxleyMD;  [facility];  Service: Cardiovascular;  Laterality: Left;  . LOWER EXTREMITY ANGIOGRAPHY Left 06/14/2019   Procedure: LOWER EXTREMITY ANGIOGRAPHY;  Surgeon: DeAlgernon HuxleyMD;  [place];  Service: Cardiovascular;  Laterality: Left;  . LOWER EXTREMITY ANGIOGRAPHY Left 08/02/2019   Procedure: LOWER EXTREMITY ANGIOGRAPHY;  Surgeon: DeAlgernon HuxleyMD;  [facility];  Service: Cardiovascular;  Laterality: Left;  . PERIPHERAL ARTERIAL STENT GRAFT     x2 left/right  . PERIPHERAL VASCULAR BALLOON ANGIOPLASTY Right 01/06/2018   Procedure: PERIPHERAL VASCULAR BALLOON ANGIOPLASTY;  Surgeon: ScKatha Cabal  MD;  Location: [place] CV LAB;  Service: Cardiovascular;  Laterality: Right;  . PERIPHERAL VASCULAR CATHETERIZATION N/A 07/06/2015   Procedure: Abdominal Aortogram w/Lower Extremity;  Surgeon: Algernon Huxley, MD;  Location: [place] CV LAB;  Service: Cardiovascular;  Laterality: N/A;  . PERIPHERAL VASCULAR CATHETERIZATION  07/06/2015   Procedure: Lower Extremity Intervention;  Surgeon: Algernon Huxley, MD;  Location: [place] CV LAB;  Service: Cardiovascular;;   Family History  Problem Relation Age of Onset  . Heart attack Father   . Heart disease Father   . Hypertension Father   . Hyperlipidemia Father   . Hypertension Mother   . Hyperlipidemia Mother    Social History   Tobacco Use  . Smoking status: Current Every Day Smoker    Packs/day: 1.00    Years: 41.00    Pack years: 41.00    Types: Cigarettes  . Smokeless tobacco: Never Used  Substance Use Topics  . Alcohol use: No   Allergies  Allergen Reactions  .  Dulaglutide Anaphylaxis, Diarrhea and Hives  . Other Itching    Skin itching associated with nitro patch  . Prednisone Rash   Prior to Admission medications   Medication Sig Start Date End Date Taking? Authorizing Provider  albuterol (VENTOLIN HFA) 108 (90 Base) MCG/ACT inhaler Inhale into the lungs every 6 (six) hours as needed for wheezing or shortness of breath.   Yes [provider]  aspirin EC 81 MG tablet Take 1 tablet (81 mg total) by mouth daily. 05/11/15  Yes Wellington Hampshire, MD  atorvastatin (LIPITOR) 80 MG tablet Take 80 mg by mouth daily.   Yes [provider]  budesonide-formoterol (SYMBICORT) 80-4.5 MCG/ACT inhaler Inhale 2 puffs into the lungs as needed.   Yes [provider]  cephALEXin (KEFLEX) 500 MG capsule Take 1 capsule (500 mg total) by mouth 3 (three) times daily. 02/10/20  Yes Schnier, Dolores Lory, MD  clopidogrel (PLAVIX) 75 MG tablet Take 75 mg by mouth daily.  08/22/17  Yes [provider]  collagenase (SANTYL) ointment Apply 1 application topically daily. 08/11/19  Yes Kris Hartmann, NP  DULoxetine (CYMBALTA) 20 MG capsule Take 20 mg by mouth daily.   Yes [provider]  ELIQUIS 5 MG TABS tablet Take 5 mg by mouth 2 (two) times daily. 02/02/20  Yes [provider]  empagliflozin (JARDIANCE) 10 MG TABS tablet Take 10 mg by mouth daily.  02/03/20  Yes [provider]  ezetimibe (ZETIA) 10 MG tablet Take 10 mg by mouth daily.   Yes [provider]  gabapentin (NEURONTIN) 800 MG tablet Take 800 mg by mouth 4 (four) times daily.  08/14/17 04/04/29 Yes [provider]  ipratropium-albuterol (DUONEB) 0.5-2.5 (3) MG/3ML SOLN Inhale 3 mLs into the lungs every 6 (six) hours as needed (wheezing/sob).    Yes [provider]  isosorbide mononitrate (IMDUR) 30 MG 24 hr tablet Take 30 mg by mouth daily.   Yes [provider]  Lactobacillus Acidophilus POWD Take by mouth.   Yes [provider]  levofloxacin (LEVAQUIN) 500 MG tablet Take 1 tablet (500 mg total) by mouth daily. 03/23/20  Yes Kris Hartmann, NP  magnesium oxide (MAG-OX) 400 MG tablet Take 400 mg by mouth.  02/03/20  Yes [provider]  metFORMIN (GLUCOPHAGE-XR) 500 MG 24 hr tablet Take 1,000 mg by mouth 2 (two) times daily.    Yes [provider]  metolazone (ZAROXOLYN) 2.5 MG tablet  10/28/19  Yes [provider]  metoprolol succinate (TOPROL-XL) 25 MG 24 hr tablet Take 1 tablet (25 mg total) by mouth daily. 07/02/19 08/29/20 Yes Loel Dubonnet, NP  mometasone-formoterol (DULERA) 200-5 MCG/ACT AERO Inhale into the lungs.   Yes [provider]  Multiple Vitamin (MULTI-VITAMINS) TABS Take 1 tablet by mouth daily.    Yes [provider]  mupirocin ointment (BACTROBAN) 2 % SMARTSIG:1 Application Topical 2-3 Times Daily 03/13/20  Yes [provider]  mupirocin ointment (BACTROBAN) 2 % APPLY TO AFFECTED AREA(s) 3 TIMES DAILY FOR OPEN SORES 03/14/20  Yes [provider]  naloxone (NARCAN) 4 MG/0.1ML LIQD nasal spray kit Place into the nose. 03/03/20  Yes [provider]  naproxen (NAPROSYN) 500 MG tablet  03/09/19  Yes [provider]  nitroGLYCERIN (NITROSTAT) 0.4 MG SL tablet Place 1 tablet (0.4 mg total) under the tongue every 5 (five) minutes as needed for chest pain. 05/19/19  Yes Hackney, Otila Kluver A, FNP  nystatin (MYCOSTATIN) 100000 UNIT/ML suspension Take 5 mLs by mouth 4 (four) times daily. 01/01/20  Yes [provider]  omeprazole (PRILOSEC) 40 MG capsule Take 40 mg by mouth daily.   Yes [provider]  Oxycodone HCl 10 MG TABS Take 10 mg by mouth every 6 (six) hours as needed (pain).  01/15/18  Yes [provider]  pantoprazole (PROTONIX) 40 MG tablet Take 40 mg by mouth daily. 02/02/20  Yes [provider]  potassium chloride (KLOR-CON) 10 MEQ tablet Take 1 tablet (10 mEq total) by mouth as directed.  Take 1 tablet twice a day on days with metolazone take 4 tablets (40 mEq) 10/04/19 02/28/29 Yes Gollan, Kathlene November, MD  promethazine (PHENERGAN) 25 MG tablet Take 25 mg by mouth every 6 (six) hours as needed. 01/01/20  Yes [provider]  sacubitril-valsartan (ENTRESTO) 24-26 MG Take 1 tablet by mouth 2 (two) times daily. 09/30/19  Yes Shahmehdi, Seyed A, MD  spironolactone (ALDACTONE) 25 MG tablet Take 1 tablet (25 mg total) by mouth daily. 10/01/19  Yes Shahmehdi, Valeria Batman, MD  SYMBICORT 80-4.5 MCG/ACT inhaler Inhale 2 puffs into the lungs daily.  01/01/20  Yes [provider]  torsemide (DEMADEX) 20 MG tablet Take 2 tablets (40 mg total) by mouth 2 (two) times daily. 03/03/20 06/01/20 Yes Gollan, Kathlene November, MD  traZODone (DESYREL) 150 MG tablet Take 150 mg by mouth at bedtime. 07/12/19  Yes [provider]  umeclidinium bromide (INCRUSE ELLIPTA) 62.5 MCG/INH AEPB Inhale 1 puff into the lungs daily.  02/03/20  Yes [provider]    Review of Systems  Constitutional: Positive for fatigue. Negative for appetite change.  HENT: Positive for rhinorrhea. Negative for congestion, postnasal drip and sore throat.   Eyes: Negative.   Respiratory: Positive for cough and shortness of breath (worsening). Negative for chest tightness and wheezing.   Cardiovascular: Positive for leg swelling (left lower leg). Negative for chest pain and palpitations.  Gastrointestinal: Positive for abdominal distention. Negative for abdominal pain.  Endocrine: Negative.   Genitourinary: Negative.   Musculoskeletal: Positive for back pain. Negative for neck pain.       Has right lower leg prosthesis  Skin: Positive for wound (left shin).  Allergic/Immunologic: Negative.   Neurological: Positive for light-headedness and headaches. Negative for dizziness.  Hematological: Negative for adenopathy. Bruises/bleeds easily.  Psychiatric/Behavioral: Negative for dysphoric mood and sleep disturbance  (sleeping on 2 pillows). The patient is not nervous/anxious.    Vitals:   04/12/20 1054  BP: 113/88  Pulse: (!) 101  Resp: 18  SpO2: 93%  Weight: 219 lb (99.3 kg)  Height: 5' 7" (1.702 m)   Wt Readings from Last 3 Encounters:  04/12/20 219 lb (99.3 kg)  04/04/20 217 lb (98.4 kg)  03/26/20 203 lb (92.1 kg)   Lab Results  Component Value Date   CREATININE 1.20 03/26/2020   CREATININE 1.23 01/20/2020   CREATININE 1.25 (H) 01/19/2020    Physical Exam Vitals and nursing note reviewed.  Constitutional:      Appearance: He is well-developed.  HENT:     Head: Normocephalic and atraumatic.  Neck:     Vascular: No JVD.  Cardiovascular:     Rate and Rhythm: Regular rhythm. Tachycardia present.  Pulmonary:     Effort: Pulmonary effort is normal.     Breath sounds: Examination of the left-lower field reveals rales. Rales present. No wheezing or rhonchi.  Abdominal:     General: There is distension.     Palpations: Abdomen is soft.     Tenderness: There is no abdominal tenderness.  Musculoskeletal:        General: No tenderness.     Cervical back: Normal range of motion and neck supple.     Left lower leg: No tenderness. Edema (2+ pitting) present.  Skin:    General: Skin is warm and dry.     Findings: No erythema.  Neurological:     Mental Status: He is alert and oriented to person, place, and time.  Psychiatric:        Behavior: Behavior normal.        Thought Content: Thought content normal.     Assessment & Plan:  1: Acute on Chronic heart failure with reduced ejection fraction- - NYHA class III - moderately fluid overloaded today with worsening swelling, shortness of breath and weight gain - weighing daily; reminded to call for an overnight weight gain of >2 pounds or a weekly weight gain of >5 pounds - weight up 21.4 pounds from last visit here 3 months ago - will send for 70m IV lasix/ 449m PO potassium today - will get BMP/BNP today as well - not adding salt  and has been using Mrs. Dash & trying to read food labels.  - drinking ~ 64 ounces of fluid daily (Mtn Dew, pepsi, water) - BP unable to tolerate entresto titration - saw cardiology (GRockey Situ10/26/21 - BNP 01/19/20 was 1511.7 - participating in paramedicine program - not interested in COVID vaccine at this time  2: HTN- - BP looks good today - [facility]CP 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 Each medication was verbally reviewed with the patient and he was encouraged to bring the bottles to every visit to confirm accuracy of list.   Patient to return tomorrow for reassessment & possibly another dose of IV lasix.

## 2020-04-12 NOTE — Telephone Encounter (Signed)
The patient missed his Lasix infusion 11/1 because he went out of town with his friend instead. He is now calling stating his swelling in his ABD and legs is worse.  He would like to get infusions scheduled again.  He has an appointment with Darylene Price this AM and states she will help get the infusions rescheduled. He was grateful for callback.

## 2020-04-12 NOTE — Telephone Encounter (Signed)
Pt c/o swelling: STAT is pt has developed SOB within 24 hours  1) How much weight have you gained and in what time span? Unknown how much  2) If swelling, where is the swelling located? Stomach and legs  3) Are you currently taking a fluid pill? yes  4) Are you currently SOB? Yes, always SOB  5) Do you have a log of your daily weights (if so, list)? no  6) Have you gained 3 pounds in a day or 5 pounds in a week? unknown  7) Have you traveled recently? No  Patient would like to receive lasix to help remove fluid  Please advise

## 2020-04-12 NOTE — Patient Instructions (Signed)
Continue weighing daily and call for an overnight weight gain of > 2 pounds or a weekly weight gain of >5 pounds. 

## 2020-04-12 NOTE — Progress Notes (Signed)
Patient ID: [name] [name], male    DOB: [birth year], 59 y.o.   MRN: [id number]  HPI  Mr Salinger is a 59 y/o male with a history of obstructive sleep apnea, PVD, PAD, HTN, hyperlipidemia, DM, CAD, COPD, BPH, asthma, current tobacco use and chronic heart failure.   Echo report from 01/20/20 reviewed and showed an EF of <20% along with mild MR and moderate TR. Echo report from 09/29/19 reviewed and showed an EF of 20-25% along with moderately elevated PA pressure, mild MR and mild/moderate TR. Echo report from 11/06/2018 reviewed and showed an EF of 20-25% along with moderate TR and a PA pressure of 59.6 mmHg. Echo report from 04/25/17 reviewed and shows an EF of 25-30% along with a PA pressure of 34 mm Hg. EF has declined from 55-60% back in 2016.   LHC done 01/20/20 showed:  Prox RCA-1 lesion is 80% stenosed.  Prox RCA-2 lesion is 20% stenosed.  Prox RCA to Mid RCA lesion is 80% stenosed.  Prox LAD lesion is 20% stenosed.  1st Diag lesion is 80% stenosed.  Mid LAD lesion is 20% stenosed.  Dist LAD lesion is 60% stenosed.  Prox Cx to Mid Cx lesion is 90% stenosed.  LPAV lesion is 95% stenosed.   1. Significant underlying three-vessel coronary artery disease. Patent proximal LAD stent with mild in-stent restenosis. Moderate diffuse disease in the distal LAD. Significant stenosis in the proximal left circumflex at the origin of the posterior AV groove artery which is also heavily diseased at the ostium. This is a bifurcation lesion and heavily calcified. RCA stent is patent. However, there is significant proximal disease in the whole mid to distal segment is diffusely diseased and small caliber. 2. Left ventricular angiography was not performed. EF was severe reduced by echo. 3. Moderately elevated left ventricular end-diastolic pressure at 28 mmHg  Cardiac catheterization done 04/28/17 showed significant three-vessel disease with a patient stent in the RCA and LAD. Significant proximal RCS  disease and the stent as well as diffuse mid and distal disease. LAD has moderate disease. Severely elevated left ventricular end-diastolic pressure at 34 mmHg. Possible CABG in the future with optimizing medical management. Stress done in 2015.  Was in the ED 03/26/20 due to right shoulder pain where he was evaluated and released.   He presents today for a follow-up visit with a chief complaint of moderate shortness of breath with minimal exertion. Says that his shortness of breath isn't any better/ worse than yesterday. He has associated fatigue, cough, pedal edema (improving), abdominal distention, headaches and easy bruising along with this. He denies any difficulty sleeping, dizziness, palpitations, chest pain, wheezing or weight gain.   He has an adjustable bed and is sleeping between 45-90 degree angle.   Received $RemoveB'80mg'QBqDqKqF$  IV lasix/ 78meq PO potassium yesterday and says that his swelling is better but he's breathing is "still bad".    Past Medical History:  Diagnosis Date  . Arthritis   . Asthma   . Atherosclerosis   . BPH (benign prostatic hyperplasia)   . Carotid arterial disease ([place])   . Charcot's joint of foot, right   . Chronic combined systolic (congestive) and diastolic (congestive) heart failure ([place])    a. 02/2013 EF 50% by LV gram; b. 04/2017 Echo: EF 25-30%. diff HK. Gr2 DD. Mod dil LA/RV. PASP 50mmHg.  [name] COPD (chronic obstructive pulmonary disease) ([place])   . Coronary artery disease    a. 2013 S/P PCI of LAD [facility]);  b. 03/2013 PCI: RCA 90p ( 2.5 x 23 mm DES); c. 02/2014 Cath: patent RCA stent->Med Rx; d. 04/2017 Cath: LM nl, LAd 30ost/p, 39m/d, D1 80ost, LCX 90p/m, OM1 85, RCA 70/20p, 29m, 70d->Referred for CT Surg-felt to be poor candidate.  . Diabetic neuropathy ([place])   . Diabetic ulcer of right foot ([place])   . Hernia   . Hyperlipidemia   . Hypertension   . Ischemic cardiomyopathy    a. 04/2017 Echo: EF 25-30%; b. 08/2017 Cardiac MRI ([facility]): EF 19%, sev glob HK. RVEF 20%,  mod BAE, triv MR, mild-mod TR. Basal lateral subendocardial infarct (viable), inf/infsept, dist septal ischemia, basal to mid lat peri-infarct ischemia.  . Morbid obesity ([place])   . Neuropathy   . PAD (peripheral artery disease) ([place])    a. Followed by Dr. Lucky Cowboy; b. 08/2010 Periph Angio: RSFA 70-80p (6X60 self-expanding stent); c. 09/2010 Periph Angio: L SFA 100p (7x unknown length self-expanding stent); d. 06/2015 Periph Angio: R SFA short segment occlusion (6x12 self-expanding stent).  . Restless leg syndrome   . Sleep apnea   . Subclavian artery stenosis, right ([place])   . Syncope and collapse   . Tobacco use    a. 75+ yr hx - still smoking 1ppd, down from 2 ppd.  . Type II diabetes mellitus ([place])   . Varicose vein    Past Surgical History:  Procedure Laterality Date  . ABDOMINAL AORTAGRAM N/A 06/23/2013   Procedure: ABDOMINAL Maxcine Ham;  Surgeon: Wellington Hampshire, MD;  Location: [facility];  Service: Cardiovascular;  Laterality: N/A;  . AMPUTATION Right 04/01/2018   Procedure: AMPUTATION BELOW KNEE;  Surgeon: Algernon Huxley, MD;  Location: [facility] ORS;  Service: Vascular;  Laterality: Right;  . APPENDECTOMY    . CARDIAC CATHETERIZATION  10/14   [facility]; X1 STENT PROXIMAL RCA  . CARDIAC CATHETERIZATION  02/2010   [facility]  . CARDIAC CATHETERIZATION  02/21/2014   [facility]  . CLAVICLE SURGERY    . HERNIA REPAIR    . IRRIGATION AND DEBRIDEMENT FOOT Right 01/04/2018   Procedure: IRRIGATION AND DEBRIDEMENT FOOT;  Surgeon: Samara Deist, DPM;  Location: [facility] ORS;  Service: Podiatry;  Laterality: Right;  . LEFT HEART CATH AND CORONARY ANGIOGRAPHY N/A 01/20/2020   Procedure: LEFT HEART CATH AND CORONARY ANGIOGRAPHY;  Surgeon: Wellington Hampshire, MD;  Location: [facility];  Service: Cardiovascular;  Laterality: N/A;  . LEFT HEART CATH AND CORS/GRAFTS ANGIOGRAPHY N/A 04/28/2017   Procedure: LEFT HEART CATH AND CORONARY ANGIOGRAPHY;  Surgeon: Wellington Hampshire, MD;  Location: [facility];  Service:  Cardiovascular;  Laterality: N/A;  . LOWER EXTREMITY ANGIOGRAPHY Right 03/03/2018   Procedure: LOWER EXTREMITY ANGIOGRAPHY;  Surgeon: Katha Cabal, MD;  Location: [facility];  Service: Cardiovascular;  Laterality: Right;  . LOWER EXTREMITY ANGIOGRAPHY Left 08/24/2018   Procedure: LOWER EXTREMITY ANGIOGRAPHY;  Surgeon: Algernon Huxley, MD;  Location: [facility];  Service: Cardiovascular;  Laterality: Left;  . LOWER EXTREMITY ANGIOGRAPHY Left 06/14/2019   Procedure: LOWER EXTREMITY ANGIOGRAPHY;  Surgeon: Algernon Huxley, MD;  Location: [facility];  Service: Cardiovascular;  Laterality: Left;  . LOWER EXTREMITY ANGIOGRAPHY Left 08/02/2019   Procedure: LOWER EXTREMITY ANGIOGRAPHY;  Surgeon: Algernon Huxley, MD;  Location: [facility];  Service: Cardiovascular;  Laterality: Left;  . PERIPHERAL ARTERIAL STENT GRAFT     x2 left/right  . PERIPHERAL VASCULAR BALLOON ANGIOPLASTY Right 01/06/2018   Procedure: PERIPHERAL VASCULAR BALLOON ANGIOPLASTY;  Surgeon: Katha Cabal, MD;  Location: [facility];  Service: Cardiovascular;  Laterality: Right;  . PERIPHERAL VASCULAR CATHETERIZATION N/A 07/06/2015   Procedure: Abdominal Aortogram w/Lower Extremity;  Surgeon: Algernon Huxley, MD;  Location: [facility];  Service: Cardiovascular;  Laterality: N/A;  . PERIPHERAL VASCULAR CATHETERIZATION  07/06/2015   Procedure: Lower Extremity Intervention;  Surgeon: Algernon Huxley, MD;  Location: [facility];  Service: Cardiovascular;;   Family History  Problem Relation Age of Onset  . Heart attack Father   . Heart disease Father   . Hypertension Father   . Hyperlipidemia Father   . Hypertension Mother   . Hyperlipidemia Mother    Social History   Tobacco Use  . Smoking status: Current Every Day Smoker    Packs/day: 1.00    Years: 41.00    Pack years: 41.00    Types: Cigarettes  . Smokeless tobacco: Never Used  Substance Use Topics  . Alcohol use: No    Allergies  Allergen Reactions  . Dulaglutide Anaphylaxis, Diarrhea and Hives  . Other Itching    Skin itching associated with nitro patch  . Prednisone Rash   Prior to Admission medications   Medication Sig Start Date End Date Taking? Authorizing Provider  albuterol (VENTOLIN HFA) 108 (90 Base) MCG/ACT inhaler Inhale into the lungs every 6 (six) hours as needed for wheezing or shortness of breath.   Yes [provider]  aspirin EC 81 MG tablet Take 1 tablet (81 mg total) by mouth daily. 05/11/15  Yes Wellington Hampshire, MD  atorvastatin (LIPITOR) 80 MG tablet Take 80 mg by mouth daily.   Yes [provider]  budesonide-formoterol (SYMBICORT) 80-4.5 MCG/ACT inhaler Inhale 2 puffs into the lungs as needed.   Yes [provider]  cephALEXin (KEFLEX) 500 MG capsule Take 1 capsule (500 mg total) by mouth 3 (three) times daily. 02/10/20  Yes Schnier, Dolores Lory, MD  clopidogrel (PLAVIX) 75 MG tablet Take 75 mg by mouth daily.  08/22/17  Yes [provider]  collagenase (SANTYL) ointment Apply 1 application topically daily. 08/11/19  Yes Kris Hartmann, NP  DULoxetine (CYMBALTA) 20 MG capsule Take 20 mg by mouth daily.   Yes [provider]  ELIQUIS 5 MG TABS tablet Take 5 mg by mouth 2 (two) times daily. 02/02/20  Yes [provider]  empagliflozin (JARDIANCE) 10 MG TABS tablet Take 10 mg by mouth daily.  02/03/20  Yes [provider]  ezetimibe (ZETIA) 10 MG tablet Take 10 mg by mouth daily.   Yes [provider]  gabapentin (NEURONTIN) 800 MG tablet Take 800 mg by mouth 4 (four) times daily.  08/14/17 04/04/29 Yes [provider]  ipratropium-albuterol (DUONEB) 0.5-2.5 (3) MG/3ML SOLN Inhale 3 mLs into the lungs every 6 (six) hours as needed (wheezing/sob).    Yes [provider]  isosorbide mononitrate (IMDUR) 30 MG 24 hr tablet Take 30 mg by mouth daily.   Yes [provider]  Lactobacillus Acidophilus  POWD Take by mouth.   Yes [provider]  levofloxacin (LEVAQUIN) 500 MG tablet Take 1 tablet (500 mg total) by mouth daily. 03/23/20  Yes Kris Hartmann, NP  magnesium oxide (MAG-OX) 400 MG tablet Take 400 mg by mouth.  02/03/20  Yes [provider]  metFORMIN (GLUCOPHAGE-XR) 500 MG 24 hr tablet Take 1,000 mg by mouth 2 (two) times daily.    Yes [provider]  metolazone (ZAROXOLYN) 2.5  MG tablet  10/28/19  Yes [provider]  metoprolol succinate (TOPROL-XL) 25 MG 24 hr tablet Take 1 tablet (25 mg total) by mouth daily. 07/02/19 08/29/20 Yes Loel Dubonnet, NP  mometasone-formoterol (DULERA) 200-5 MCG/ACT AERO Inhale into the lungs.   Yes [provider]  Multiple Vitamin (MULTI-VITAMINS) TABS Take 1 tablet by mouth daily.    Yes [provider]  mupirocin ointment (BACTROBAN) 2 % SMARTSIG:1 Application Topical 2-3 Times Daily 03/13/20  Yes [provider]  mupirocin ointment (BACTROBAN) 2 % APPLY TO AFFECTED AREA(s) 3 TIMES DAILY FOR OPEN SORES 03/14/20  Yes [provider]  naloxone (NARCAN) 4 MG/0.1ML LIQD nasal spray kit Place into the nose. 03/03/20  Yes [provider]  naproxen (NAPROSYN) 500 MG tablet  03/09/19  Yes [provider]  nitroGLYCERIN (NITROSTAT) 0.4 MG SL tablet Place 1 tablet (0.4 mg total) under the tongue every 5 (five) minutes as needed for chest pain. 05/19/19  Yes Anadelia Kintz, Otila Kluver A, FNP  nystatin (MYCOSTATIN) 100000 UNIT/ML suspension Take 5 mLs by mouth 4 (four) times daily. 01/01/20  Yes [provider]  omeprazole (PRILOSEC) 40 MG capsule Take 40 mg by mouth daily.   Yes [provider]  Oxycodone HCl 10 MG TABS Take 10 mg by mouth every 6 (six) hours as needed (pain).  01/15/18  Yes [provider]  pantoprazole (PROTONIX) 40 MG tablet Take 40 mg by mouth daily. 02/02/20  Yes [provider]  potassium chloride (KLOR-CON) 10 MEQ tablet Take 1 tablet  (10 mEq total) by mouth as directed. Take 1 tablet twice a day on days with metolazone take 4 tablets (40 mEq) 10/04/19 02/28/29 Yes Gollan, Kathlene November, MD  promethazine (PHENERGAN) 25 MG tablet Take 25 mg by mouth every 6 (six) hours as needed. 01/01/20  Yes [provider]  sacubitril-valsartan (ENTRESTO) 24-26 MG Take 1 tablet by mouth 2 (two) times daily. 09/30/19  Yes Shahmehdi, Seyed A, MD  spironolactone (ALDACTONE) 25 MG tablet Take 1 tablet (25 mg total) by mouth daily. 10/01/19  Yes Shahmehdi, Valeria Batman, MD  SYMBICORT 80-4.5 MCG/ACT inhaler Inhale 2 puffs into the lungs daily.  01/01/20  Yes [provider]  torsemide (DEMADEX) 20 MG tablet Take 2 tablets (40 mg total) by mouth 2 (two) times daily. 03/03/20 06/01/20 Yes Gollan, Kathlene November, MD  traZODone (DESYREL) 150 MG tablet Take 150 mg by mouth at bedtime. 07/12/19  Yes [provider]  umeclidinium bromide (INCRUSE ELLIPTA) 62.5 MCG/INH AEPB Inhale 1 puff into the lungs daily.  02/03/20  Yes [provider]     Review of Systems  Constitutional: Positive for fatigue. Negative for appetite change.  HENT: Positive for rhinorrhea. Negative for congestion, postnasal drip and sore throat.   Eyes: Negative.   Respiratory: Positive for cough and shortness of breath (unchanged). Negative for chest tightness and wheezing.   Cardiovascular: Positive for leg swelling (left lower leg: improving). Negative for chest pain and palpitations.  Gastrointestinal: Positive for abdominal distention. Negative for abdominal pain.  Endocrine: Negative.   Genitourinary: Negative.   Musculoskeletal: Positive for back pain. Negative for neck pain.       Has right lower leg prosthesis  Skin: Positive for wound (left shin).  Allergic/Immunologic: Negative.   Neurological: Positive for headaches. Negative for dizziness and light-headedness.  Hematological: Negative for adenopathy. Bruises/bleeds easily.  Psychiatric/Behavioral:  Negative for dysphoric mood and sleep disturbance (sleeping on 2 pillows). The patient is not nervous/anxious.  Vitals:   04/13/20 1334  BP: 118/89  Pulse: 91  Resp: 18  SpO2: 97%  Weight: 205 lb (93 kg)  Height: $Remove'5\' 7"'rtPhOXP$  (1.702 m)   Wt Readings from Last 3 Encounters:  04/13/20 205 lb (93 kg)  04/12/20 218 lb 4.1 oz (99 kg)  04/12/20 219 lb (99.3 kg)   Lab Results  Component Value Date   CREATININE 1.23 04/12/2020   CREATININE 1.20 03/26/2020   CREATININE 1.23 01/20/2020   Physical Exam Vitals and nursing note reviewed.  Constitutional:      Appearance: He is well-developed.  HENT:     Head: Normocephalic and atraumatic.  Neck:     Vascular: No JVD.  Cardiovascular:     Rate and Rhythm: Normal rate and regular rhythm.  Pulmonary:     Effort: Pulmonary effort is normal.     Breath sounds: Examination of the left-lower field reveals rales. Rhonchi (lower lobes) and rales present. No wheezing.  Abdominal:     General: There is distension.     Palpations: Abdomen is soft.     Tenderness: There is no abdominal tenderness.  Musculoskeletal:        General: Deformity (right leg prosthesis present) present. No tenderness.     Cervical back: Normal range of motion and neck supple.     Left lower leg: No tenderness. Edema (2+ pitting) present.  Skin:    General: Skin is warm and dry.     Findings: No erythema.  Neurological:     Mental Status: He is alert and oriented to person, place, and time.  Psychiatric:        Behavior: Behavior normal.        Thought Content: Thought content normal.     Assessment & Plan:  1: Acute on Chronic heart failure with reduced ejection fraction- - NYHA class III - moderately fluid overloaded today although less edema and weight loss - weighing daily; reminded to call for an overnight weight gain of >2 pounds or a weekly weight gain of >5 pounds - weight down 14 pounds from last visit yesterday - received $RemoveBe'80mg'vtOtgXCPh$  IV lasix/ 85meq PO  potassium yesterday; has another appointment after leaving here so will return tomorrow morning for IV lasix again - BMP/BNP to be checked tomorrow - not adding salt and has been using Mrs. Dash & trying to read food labels.  - drinking ~ 64 ounces of fluid daily (Mtn Dew, pepsi, water) - BP unable to tolerate entresto titration - saw cardiology Rockey Situ) 04/04/20 - BNP 04/12/20 was 2512.7 - participating in paramedicine program - not interested in COVID vaccine at this time  2: HTN- - BP looks good today - saw PCP Dr. 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 Each medication was verbally reviewed with the patient and he was encouraged to bring the bottles to every visit to confirm accuracy of list.  Return in 4 days (Monday).

## 2020-04-13 ENCOUNTER — Encounter: Payer: Self-pay | Admitting: Family

## 2020-04-13 ENCOUNTER — Other Ambulatory Visit: Payer: Self-pay | Admitting: Family

## 2020-04-13 ENCOUNTER — Encounter (INDEPENDENT_AMBULATORY_CARE_PROVIDER_SITE_OTHER): Payer: Self-pay | Admitting: Nurse Practitioner

## 2020-04-13 ENCOUNTER — Ambulatory Visit (INDEPENDENT_AMBULATORY_CARE_PROVIDER_SITE_OTHER): Payer: Medicare Other | Admitting: Nurse Practitioner

## 2020-04-13 ENCOUNTER — Ambulatory Visit: Payer: Medicare Other | Attending: Family | Admitting: Family

## 2020-04-13 ENCOUNTER — Other Ambulatory Visit: Payer: Self-pay

## 2020-04-13 VITALS — BP 117/87 | HR 94 | Resp 16 | Wt 205.0 lb

## 2020-04-13 VITALS — BP 118/89 | HR 91 | Resp 18 | Ht 67.0 in | Wt 205.0 lb

## 2020-04-13 DIAGNOSIS — J449 Chronic obstructive pulmonary disease, unspecified: Secondary | ICD-10-CM | POA: Diagnosis not present

## 2020-04-13 DIAGNOSIS — Z89511 Acquired absence of right leg below knee: Secondary | ICD-10-CM | POA: Diagnosis not present

## 2020-04-13 DIAGNOSIS — Z7984 Long term (current) use of oral hypoglycemic drugs: Secondary | ICD-10-CM | POA: Diagnosis not present

## 2020-04-13 DIAGNOSIS — Z8249 Family history of ischemic heart disease and other diseases of the circulatory system: Secondary | ICD-10-CM | POA: Insufficient documentation

## 2020-04-13 DIAGNOSIS — Z7951 Long term (current) use of inhaled steroids: Secondary | ICD-10-CM | POA: Diagnosis not present

## 2020-04-13 DIAGNOSIS — I10 Essential (primary) hypertension: Secondary | ICD-10-CM

## 2020-04-13 DIAGNOSIS — Z955 Presence of coronary angioplasty implant and graft: Secondary | ICD-10-CM | POA: Insufficient documentation

## 2020-04-13 DIAGNOSIS — S88111A Complete traumatic amputation at level between knee and ankle, right lower leg, initial encounter: Secondary | ICD-10-CM

## 2020-04-13 DIAGNOSIS — Z888 Allergy status to other drugs, medicaments and biological substances status: Secondary | ICD-10-CM | POA: Insufficient documentation

## 2020-04-13 DIAGNOSIS — I110 Hypertensive heart disease with heart failure: Secondary | ICD-10-CM | POA: Diagnosis not present

## 2020-04-13 DIAGNOSIS — E1151 Type 2 diabetes mellitus with diabetic peripheral angiopathy without gangrene: Secondary | ICD-10-CM | POA: Insufficient documentation

## 2020-04-13 DIAGNOSIS — I70299 Other atherosclerosis of native arteries of extremities, unspecified extremity: Secondary | ICD-10-CM

## 2020-04-13 DIAGNOSIS — N400 Enlarged prostate without lower urinary tract symptoms: Secondary | ICD-10-CM | POA: Diagnosis not present

## 2020-04-13 DIAGNOSIS — I2510 Atherosclerotic heart disease of native coronary artery without angina pectoris: Secondary | ICD-10-CM | POA: Insufficient documentation

## 2020-04-13 DIAGNOSIS — I70201 Unspecified atherosclerosis of native arteries of extremities, right leg: Secondary | ICD-10-CM | POA: Insufficient documentation

## 2020-04-13 DIAGNOSIS — L97909 Non-pressure chronic ulcer of unspecified part of unspecified lower leg with unspecified severity: Secondary | ICD-10-CM

## 2020-04-13 DIAGNOSIS — E785 Hyperlipidemia, unspecified: Secondary | ICD-10-CM | POA: Diagnosis not present

## 2020-04-13 DIAGNOSIS — Z7982 Long term (current) use of aspirin: Secondary | ICD-10-CM | POA: Diagnosis not present

## 2020-04-13 DIAGNOSIS — Z79899 Other long term (current) drug therapy: Secondary | ICD-10-CM | POA: Diagnosis not present

## 2020-04-13 DIAGNOSIS — F17210 Nicotine dependence, cigarettes, uncomplicated: Secondary | ICD-10-CM | POA: Diagnosis not present

## 2020-04-13 DIAGNOSIS — I5023 Acute on chronic systolic (congestive) heart failure: Secondary | ICD-10-CM | POA: Diagnosis not present

## 2020-04-13 DIAGNOSIS — Z7901 Long term (current) use of anticoagulants: Secondary | ICD-10-CM | POA: Insufficient documentation

## 2020-04-13 DIAGNOSIS — I70248 Atherosclerosis of native arteries of left leg with ulceration of other part of lower left leg: Secondary | ICD-10-CM | POA: Diagnosis not present

## 2020-04-13 DIAGNOSIS — Z7902 Long term (current) use of antithrombotics/antiplatelets: Secondary | ICD-10-CM | POA: Insufficient documentation

## 2020-04-13 DIAGNOSIS — E1159 Type 2 diabetes mellitus with other circulatory complications: Secondary | ICD-10-CM

## 2020-04-13 DIAGNOSIS — Z791 Long term (current) use of non-steroidal anti-inflammatories (NSAID): Secondary | ICD-10-CM | POA: Insufficient documentation

## 2020-04-13 DIAGNOSIS — Z720 Tobacco use: Secondary | ICD-10-CM

## 2020-04-13 DIAGNOSIS — I5043 Acute on chronic combined systolic (congestive) and diastolic (congestive) heart failure: Secondary | ICD-10-CM

## 2020-04-13 NOTE — Patient Instructions (Signed)
Continue weighing daily and call for an overnight weight gain of > 2 pounds or a weekly weight gain of >5 pounds. 

## 2020-04-14 ENCOUNTER — Ambulatory Visit
Admission: RE | Admit: 2020-04-14 | Discharge: 2020-04-14 | Disposition: A | Discharge status: Home / Self Care | Payer: Medicare Other | Source: Ambulatory Visit | Attending: Family | Admitting: Family

## 2020-04-14 ENCOUNTER — Telehealth: Payer: Self-pay | Admitting: Family

## 2020-04-14 DIAGNOSIS — I5043 Acute on chronic combined systolic (congestive) and diastolic (congestive) heart failure: Secondary | ICD-10-CM | POA: Insufficient documentation

## 2020-04-14 LAB — BASIC METABOLIC PANEL
Anion gap: 13 (ref 5–15)
BUN: 17 mg/dL (ref 6–20)
CO2: 27 mmol/L (ref 22–32)
Calcium: 9.4 mg/dL (ref 8.9–10.3)
Chloride: 86 mmol/L — ABNORMAL LOW (ref 98–111)
Creatinine, Ser: 1.32 mg/dL — ABNORMAL HIGH (ref 0.61–1.24)
GFR, Estimated: >60 mL/min (ref >60)
Glucose, Bld: 140 mg/dL — ABNORMAL HIGH (ref 70–99)
Potassium: 4.6 mmol/L (ref 3.5–5.1)
Sodium: 126 mmol/L — ABNORMAL LOW (ref 135–145)

## 2020-04-14 LAB — BRAIN NATRIURETIC PEPTIDE: B Natriuretic Peptide: 2207.9 pg/mL — ABNORMAL HIGH (ref 0.0–100.0)

## 2020-04-14 MED ORDER — POTASSIUM CHLORIDE CRYS ER 20 MEQ PO TBCR
EXTENDED_RELEASE_TABLET | ORAL | Status: AC
  Administered 2020-04-14: 40 meq via ORAL
  Filled 2020-04-14: qty 2

## 2020-04-14 MED ORDER — FUROSEMIDE 10 MG/ML IJ SOLN
INTRAMUSCULAR | Status: AC
  Administered 2020-04-14: 80 mg via INTRAVENOUS
  Filled 2020-04-14: qty 4

## 2020-04-14 MED ORDER — POTASSIUM CHLORIDE CRYS ER 20 MEQ PO TBCR: 40.0000 meq | EXTENDED_RELEASE_TABLET | Freq: Once | ORAL | Status: AC

## 2020-04-14 MED ORDER — FUROSEMIDE 10 MG/ML IJ SOLN: 80.0000 mg | Freq: Once | INTRAMUSCULAR | Status: AC

## 2020-04-14 NOTE — Telephone Encounter (Signed)
Spoke with patient regarding lab results obtained earlier today when receiving another dose of 80mg  IV lasix/ 30meq PO potassium.   Advised that his BNP was still quite elevated although slightly better from 2 days ago. Renal function is slightly worse although GFR still >60. Potassium is stable but sodium level has declined from 131 two days ago to 126 earlier today. Consulted with cardiology [name] [name]) regarding this.   Patient's significant other, [name] [name], confirmed that patient is taking torsemide 40mg  BID but is not taking spironolactone nor metolazone.   Patient says that he drinks "a lot" of fluids and estimates that he drinks ~3L of fluids daily. Reports that his swelling and his shortness of breath have improved. Explained that he needed to cut his fluid intake in half over the weekend and we would recheck BMP on Monday the 8th when he returns. Also said that he could eat a few potato chips to help get his sodium level up.   Patient verbalized understanding.

## 2020-04-15 NOTE — Progress Notes (Signed)
Patient ID: [name] [name], male    DOB: 12/13/1960, 59 y.o.   MRN: [id number]  HPI  [name] [name] is a 59 y/o male with a history of obstructive sleep apnea, PVD, PAD, HTN, hyperlipidemia, DM, CAD, COPD, BPH, asthma, current tobacco use and chronic heart failure.   Echo report from 01/20/20 reviewed and showed an EF of <20% along with mild [name] and moderate TR. Echo report from 09/29/19 reviewed and showed an EF of 20-25% along with moderately elevated PA pressure, mild [name] and mild/moderate TR. Echo report from 11/06/2018 reviewed and showed an EF of 20-25% along with moderate TR and a PA pressure of 59.6 mmHg. Echo report from 04/25/17 reviewed and shows an EF of 25-30% along with a PA pressure of 34 mm Hg. EF has declined from 55-60% back in 2016.   LHC done 01/20/20 showed:  Prox RCA-1 lesion is 80% stenosed.  Prox RCA-2 lesion is 20% stenosed.  Prox RCA to Mid RCA lesion is 80% stenosed.  Prox LAD lesion is 20% stenosed.  1st Diag lesion is 80% stenosed.  Mid LAD lesion is 20% stenosed.  Dist LAD lesion is 60% stenosed.  Prox Cx to Mid Cx lesion is 90% stenosed.  LPAV lesion is 95% stenosed.   1. Significant underlying three-vessel coronary artery disease. Patent proximal LAD stent with mild in-stent restenosis. Moderate diffuse disease in the distal LAD. Significant stenosis in the proximal left circumflex at the origin of the posterior AV groove artery which is also heavily diseased at the ostium. This is a bifurcation lesion and heavily calcified. RCA stent is patent. However, there is significant proximal disease in the whole mid to distal segment is diffusely diseased and small caliber. 2. Left ventricular angiography was not performed. EF was severe reduced by echo. 3. Moderately elevated left ventricular end-diastolic pressure at 28 mmHg  Cardiac catheterization done 04/28/17 showed significant three-vessel disease with a patient stent in the RCA and LAD. Significant proximal RCS  disease and the stent as well as diffuse mid and distal disease. LAD has moderate disease. Severely elevated left ventricular end-diastolic pressure at 34 mmHg. Possible CABG in the future with optimizing medical management. Stress done in 2015.  Was in the ED 03/26/20 due to right shoulder pain where he was evaluated and released.   He presents today for a follow-up visit with a chief complaint of moderate shortness of breath upon minimal exertion. He describes this as chronic in nature having been present for several years and says that it's unchanged from last week. He has associated headaches, cough, fatigue, pedal edema, abdominal distention and slight weight gain along with this. He denies any difficulty sleeping, palpitations, chest pain, wheezing or dizziness.   Received 9m IV lasix/ 473m PO potassium twice last week and says that his symptoms aren't any different. He says that he's decreased his fluid intake in 1/2 as he was drinking ~ 3L of fluids daily.   Brought his medication bottles and upon review, he says that he's only taking his entresto and eliquis daily instead of BID. He's unsure of why he's been doing it that way.    Past Medical History:  Diagnosis Date  . Arthritis   . Asthma   . Atherosclerosis   . BPH (benign prostatic hyperplasia)   . Carotid arterial disease (HCRock[place]  . Charcot's joint of foot, right   . Chronic combined systolic (congestive) and diastolic (congestive) heart failure (HCLake [place]   a. 02/2013 EF 50%  by LV gram; b. 04/2017 Echo: EF 25-30%. diff HK. Gr2 DD. Mod dil LA/RV. PASP 28mHg.  .Marland KitchenCOPD (chronic obstructive pulmonary disease) (HK[place]   . Coronary artery disease    a. 2013 S/P PCI of LAD ([facility];  b. 03/2013 PCI: RCA 90p ( 2.5 x 23 mm DES); c. 02/2014 Cath: patent RCA stent->Med Rx; d. 04/2017 Cath: LM nl, LAd 30ost/p, 618m, D1 80ost, LCX 90p/m, OM1 85, RCA 70/20p, 8032m0d->Referred for CT Surg-felt to be poor candidate.  . Diabetic neuropathy ([facility]CC[place] .  Diabetic ulcer of right foot (HCCWest Rushville . Hernia   . Hyperlipidemia   . Hypertension   . Ischemic cardiomyopathy    a. 04/2017 Echo: EF 25-30%; b. 08/2017 Cardiac MRI ([facility]): EF 19%, sev glob HK. RVEF 20%, mod BAE, triv [name], mild-mod TR. Basal lateral subendocardial infarct (viable), inf/infsept, dist septal ischemia, basal to mid lat peri-infarct ischemia.  . Morbid obesity (HCCWhite [place] . Neuropathy   . PAD (peripheral artery disease) (HCCHendricks  a. Followed by Dr. DewLucky Cowboy. 08/2010 Periph Angio: RSFA 70-80p (6X60 self-expanding stent); c. 09/2010 Periph Angio: L SFA 100p (7x unknown length self-expanding stent); d. 06/2015 Periph Angio: R SFA short segment occlusion (6x12 self-expanding stent).  . Restless leg syndrome   . Sleep apnea   . Subclavian artery stenosis, right (HCCAddieville . Syncope and collapse   . Tobacco use    a. 75+ yr hx - still smoking 1ppd, down from 2 ppd.  . Type II diabetes mellitus (HCCMassanutten . Varicose vein    Past Surgical History:  Procedure Laterality Date  . ABDOMINAL AORTAGRAM N/A 06/23/2013   Procedure: ABDOMINAL AORMaxcine HamSurgeon: MuhWellington HampshireD;  [facility];  Service: Cardiovascular;  Laterality: N/A;  . AMPUTATION Right 04/01/2018   Procedure: AMPUTATION BELOW KNEE;  Surgeon: DewAlgernon HuxleyD;  Location: [facility];  Service: Vascular;  Laterality: Right;  . APPENDECTOMY    . CARDIAC CATHETERIZATION  10/14   [facility]; X1 STENT PROXIMAL RCA  . CARDIAC CATHETERIZATION  02/2010   [facility]  . CARDIAC CATHETERIZATION  02/21/2014   [facility]  . CLAVICLE SURGERY    . HERNIA REPAIR    . IRRIGATION AND DEBRIDEMENT FOOT Right 01/04/2018   Procedure: IRRIGATION AND DEBRIDEMENT FOOT;  Surgeon: FowSamara DeistPM;  Location: [facility];  Service: Podiatry;  Laterality: Right;  . LEFT HEART CATH AND CORONARY ANGIOGRAPHY N/A 01/20/2020   Procedure: LEFT HEART CATH AND CORONARY ANGIOGRAPHY;  Surgeon: AriWellington HampshireD;  [facility];  Service: Cardiovascular;   Laterality: N/A;  . LEFT HEART CATH AND CORS/GRAFTS ANGIOGRAPHY N/A 04/28/2017   Procedure: LEFT HEART CATH AND CORONARY ANGIOGRAPHY;  Surgeon: AriWellington HampshireD;  [facility];  Service: Cardiovascular;  Laterality: N/A;  . LOWER EXTREMITY ANGIOGRAPHY Right 03/03/2018   Procedure: LOWER EXTREMITY ANGIOGRAPHY;  Surgeon: SchKatha CabalD;  [facility];  Service: Cardiovascular;  Laterality: Right;  . LOWER EXTREMITY ANGIOGRAPHY Left 08/24/2018   Procedure: LOWER EXTREMITY ANGIOGRAPHY;  Surgeon: DewAlgernon HuxleyD;  [facility];  Service: Cardiovascular;  Laterality: Left;  . LOWER EXTREMITY ANGIOGRAPHY Left 06/14/2019   Procedure: LOWER EXTREMITY ANGIOGRAPHY;  Surgeon: DewAlgernon HuxleyD;  [facility];  Service: Cardiovascular;  Laterality: Left;  . LOWER EXTREMITY ANGIOGRAPHY Left 08/02/2019   Procedure: LOWER EXTREMITY ANGIOGRAPHY;  Surgeon: DewLeotis Pain  S, MD;  Location: [place] [facility];  Service: Cardiovascular;  Laterality: Left;  . PERIPHERAL ARTERIAL STENT GRAFT     x2 left/right  . PERIPHERAL VASCULAR BALLOON ANGIOPLASTY Right 01/06/2018   Procedure: PERIPHERAL VASCULAR BALLOON ANGIOPLASTY;  Surgeon: Katha Cabal, MD;  Location: [place] [facility];  Service: Cardiovascular;  Laterality: Right;  . PERIPHERAL VASCULAR CATHETERIZATION N/A 07/06/2015   Procedure: Abdominal Aortogram w/Lower Extremity;  Surgeon: Algernon Huxley, MD;  Location: [place] [facility];  Service: Cardiovascular;  Laterality: N/A;  . PERIPHERAL VASCULAR CATHETERIZATION  07/06/2015   Procedure: Lower Extremity Intervention;  Surgeon: Algernon Huxley, MD;  Location: [place] [facility];  Service: Cardiovascular;;   Family History  Problem Relation Age of Onset  . Heart attack Father   . Heart disease Father   . Hypertension Father   . Hyperlipidemia Father   . Hypertension Mother   . Hyperlipidemia Mother    Social History   Tobacco  Use  . Smoking status: Current Every Day Smoker    Packs/day: 1.00    Years: 41.00    Pack years: 41.00    Types: Cigarettes  . Smokeless tobacco: Never Used  Substance Use Topics  . Alcohol use: No   Allergies  Allergen Reactions  . Dulaglutide Anaphylaxis, Diarrhea and Hives  . Other Itching    Skin itching associated with nitro patch  . Prednisone Rash   Prior to Admission medications   Medication Sig Start Date End Date Taking? Authorizing Provider  albuterol (VENTOLIN HFA) 108 (90 Base) MCG/ACT inhaler Inhale into the lungs every 6 (six) hours as needed for wheezing or shortness of breath.   Yes [provider]  amiodarone (PACERONE) 200 MG tablet Take 200 mg by mouth daily.   Yes [provider]  aspirin EC 81 MG tablet Take 1 tablet (81 mg total) by mouth daily. 05/11/15  Yes Wellington Hampshire, MD  atorvastatin (LIPITOR) 80 MG tablet Take 80 mg by mouth daily.   Yes [provider]  budesonide-formoterol (SYMBICORT) 80-4.5 MCG/ACT inhaler Inhale 2 puffs into the lungs as needed.   Yes [provider]  clopidogrel (PLAVIX) 75 MG tablet Take 75 mg by mouth daily.  08/22/17  Yes [provider]  DULoxetine (CYMBALTA) 20 MG capsule Take 30 mg by mouth daily.    Yes [provider]  ELIQUIS 5 MG TABS tablet Take 5 mg by mouth daily.  02/02/20  Yes [provider]  empagliflozin (JARDIANCE) 10 MG TABS tablet Take 10 mg by mouth daily.  02/03/20  Yes [provider]  gabapentin (NEURONTIN) 800 MG tablet Take 800 mg by mouth 4 (four) times daily.  08/14/17 04/04/29 Yes [provider]  Ipratropium-Albuterol (COMBIVENT RESPIMAT) 20-100 MCG/ACT AERS respimat Inhale 1 puff into the lungs every 6 (six) hours as needed for wheezing.   Yes [provider]  ipratropium-albuterol (DUONEB) 0.5-2.5 (3) MG/3ML SOLN Inhale 3 mLs into the lungs every 6 (six) hours as needed (wheezing/sob).    Yes [provider]  isosorbide mononitrate (IMDUR) 30 MG 24 hr tablet Take 30 mg by mouth daily.   Yes [provider]  magnesium oxide (MAG-OX) 400 MG tablet Take 400 mg by mouth.  02/03/20  Yes [provider]  metFORMIN (GLUCOPHAGE-XR) 500 MG 24 hr tablet Take 1,000 mg by mouth 2 (two) times daily.    Yes [provider]  metoprolol succinate (TOPROL-XL) 25 MG 24 hr tablet Take 1  tablet (25 mg total) by mouth daily. 07/02/19 08/29/20 Yes Loel Dubonnet, NP  naproxen (NAPROSYN) 500 MG tablet  03/09/19  Yes [provider]  nitroGLYCERIN (NITROSTAT) 0.4 MG SL tablet Place 1 tablet (0.4 mg total) under the tongue every 5 (five) minutes as needed for chest pain. 05/19/19  Yes Lynnel Zanetti, Otila Kluver A, FNP  omeprazole (PRILOSEC) 40 MG capsule Take 40 mg by mouth daily.   Yes [provider]  Oxycodone HCl 10 MG TABS Take 10 mg by mouth every 6 (six) hours as needed (pain).  01/15/18  Yes [provider]  pantoprazole (PROTONIX) 40 MG tablet Take 40 mg by mouth daily. 02/02/20  Yes [provider]  potassium chloride (KLOR-CON) 10 MEQ tablet Take 1 tablet (10 mEq total) by mouth as directed. Take 1 tablet twice a day on days with metolazone take 4 tablets (40 mEq) Patient taking differently: Take 10 mEq by mouth 2 (two) times daily.  10/04/19 02/28/29 Yes Gollan, Kathlene November, MD  sacubitril-valsartan (ENTRESTO) 24-26 MG Take 1 tablet by mouth 2 (two) times daily. Patient taking differently: Take 1 tablet by mouth daily.  09/30/19  Yes Shahmehdi, Valeria Batman, MD  torsemide (DEMADEX) 20 MG tablet Take 2 tablets (40 mg total) by mouth 2 (two) times daily. 03/03/20 06/01/20 Yes Gollan, Kathlene November, MD  traZODone (DESYREL) 150 MG tablet Take 150 mg by mouth at bedtime. 07/12/19  Yes [provider]  collagenase (SANTYL) ointment Apply 1 application topically daily. 08/11/19   Kris Hartmann, NP  ezetimibe (ZETIA) 10 MG tablet Take 10 mg by mouth daily. Patient not taking:  Reported on 04/17/2020    [provider]  Lactobacillus Acidophilus POWD Take by mouth.    [provider]  mometasone-formoterol (DULERA) 200-5 MCG/ACT AERO Inhale 2 puffs into the lungs as needed.     [provider]  Multiple Vitamin (MULTI-VITAMINS) TABS Take 1 tablet by mouth daily.     [provider]  mupirocin ointment (BACTROBAN) 2 % SMARTSIG:1 Application Topical 2-3 Times Daily 03/13/20   [provider]  naloxone (NARCAN) 4 MG/0.1ML LIQD nasal spray kit Place into the nose. 03/03/20   [provider]  nystatin (MYCOSTATIN) 100000 UNIT/ML suspension Take 5 mLs by mouth 4 (four) times daily. 01/01/20   [provider]  promethazine (PHENERGAN) 25 MG tablet Take 25 mg by mouth every 6 (six) hours as needed. Patient not taking: Reported on 04/17/2020 01/01/20   [provider]  SYMBICORT 80-4.5 MCG/ACT inhaler Inhale 2 puffs into the lungs daily.  01/01/20   [provider]  umeclidinium bromide (INCRUSE ELLIPTA) 62.5 MCG/INH AEPB Inhale 1 puff into the lungs daily.  02/03/20   [provider]    Review of Systems  Constitutional: Positive for fatigue. Negative for appetite change.  HENT: Positive for rhinorrhea. Negative for congestion, postnasal drip and sore throat.   Eyes: Negative.   Respiratory: Positive for cough and shortness of breath (unchanged). Negative for chest tightness and wheezing.   Cardiovascular: Positive for leg swelling (left lower leg: improving). Negative for chest pain and palpitations.  Gastrointestinal: Positive for abdominal distention. Negative for abdominal pain.  Endocrine: Negative.   Genitourinary: Negative.   Musculoskeletal: Positive for back pain. Negative for neck pain.       Has right lower leg prosthesis  Skin: Positive for wound (left shin).  Allergic/Immunologic: Negative.   Neurological: Positive for headaches. Negative for dizziness and light-headedness.   Hematological: Negative for adenopathy.  Bruises/bleeds easily.  Psychiatric/Behavioral: Negative for dysphoric mood and sleep disturbance (sleeping on 2 pillows). The patient is not nervous/anxious.    Vitals:   04/17/20 1300  BP: 114/78  Pulse: 88  Resp: 18  SpO2: 99%  Weight: 210 lb (95.3 kg)  Height: _0  (1.702 m)   Wt Readings from Last 3 Encounters:  04/17/20 210 lb (95.3 kg)  04/13/20 205 lb (93 kg)  04/13/20 205 lb (93 kg)   Lab Results  Component Value Date   CREATININE 1.32 (H) 04/14/2020   CREATININE 1.23 04/12/2020   CREATININE 1.20 03/26/2020    Physical Exam Vitals and nursing note reviewed.  Constitutional:      Appearance: He is well-developed.  HENT:     Head: Normocephalic and atraumatic.  Neck:     Vascular: No JVD.  Cardiovascular:     Rate and Rhythm: Normal rate and regular rhythm.  Pulmonary:     Effort: Pulmonary effort is normal.     Breath sounds: Examination of the left-lower field reveals rales. Rhonchi (lower lobes) and rales present. No wheezing.  Abdominal:     General: There is distension.     Palpations: Abdomen is soft.     Tenderness: There is no abdominal tenderness.  Musculoskeletal:        General: Deformity (right leg prosthesis present) present. No tenderness.     Cervical back: Normal range of motion and neck supple.     Left lower leg: No tenderness. Edema (2+ pitting) present.  Skin:    General: Skin is warm and dry.     Findings: No erythema.  Neurological:     Mental Status: He is alert and oriented to person, place, and time.  Psychiatric:        Behavior: Behavior normal.        Thought Content: Thought content normal.     Assessment & Plan:  1: Chronic heart failure with reduced ejection fraction- - NYHA class III - moderately fluid overloaded today with continued pedal edema and weight gain - weighing daily; reminded to call for an overnight weight gain of >2 pounds or a weekly weight gain of >5 pounds -  weight up 5 pounds from last visit 4 days ago - received 38m IV lasix/ 455m PO potassium twice last week with resultant hyponatremia; he says that he's cut in fluid intake in half as he was drinking ~3L / day - BMP/BNP to be re-checked today; discussed possibly doing IV lasix a couple of days/ week for a couple of weeks if labs allow - advised that he needed to take entresto and eliquis BID - not adding salt and has been using Mrs. Dash & trying to read food labels.  - had been drinking ~ 3L of fluids daily; has tried to decrease that over the last few days - saw cardiology (GRockey Situ10/26/21 & returns 05/16/20 - BNP 04/14/20 was 2207.9 - participating in paramedicine program [place]  2: HTN- - BP looks good today - 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 Patient voices frustration over all the medications that he has to take and that he has to take some medications to manage the side effects of others.   Will call patient after getting lab results back and consulting with patient's cardiologist. Return to office pending results.

## 2020-04-16 ENCOUNTER — Encounter (INDEPENDENT_AMBULATORY_CARE_PROVIDER_SITE_OTHER): Payer: Self-pay | Admitting: Nurse Practitioner

## 2020-04-16 NOTE — Progress Notes (Signed)
Subjective:    Patient ID: [name] [name], male    DOB: [birth year], 59 y.o.   MRN: [id number] Chief Complaint  Patient presents with  . Follow-up    update note for prothesis rx    The patient presents today for evaluation of his right below-knee prosthetic.  The patient recently was admitted for a heart failure exacerbation and was significantly diuresed. Patient has reached his appropriate dry weight his current prosthesis does not fit appropriately.  The prosthesis wobbles when he tries to walk.  This is despite using additional sock.  Currently the patient denies any wounds or ulcerations of the stump.  He denies any fever, chills, nausea vomiting or diarrhea.   Review of Systems  Musculoskeletal: Positive for gait problem.  All other systems reviewed and are negative.      Objective:   Physical Exam Vitals reviewed.  HENT:     Head: Normocephalic.  Cardiovascular:     Rate and Rhythm: Normal rate.     Pulses: Normal pulses.  Musculoskeletal:     Right Lower Extremity: Right leg is amputated below knee.  Neurological:     Mental Status: He is alert and oriented to person, place, and time.  Psychiatric:        Mood and Affect: Mood normal.        Behavior: Behavior normal.        Thought Content: Thought content normal.        Judgment: Judgment normal.     BP 117/87 (BP Location: Right Arm)   Pulse 94   Resp 16   Wt 205 lb (93 kg)   BMI 32.11 kg/m   Past Medical History:  Diagnosis Date  . Arthritis   . Asthma   . Atherosclerosis   . BPH (benign prostatic hyperplasia)   . Carotid arterial disease ([place])   . Charcot's joint of foot, right   . Chronic combined systolic (congestive) and diastolic (congestive) heart failure ([place])    a. 02/2013 EF 50% by LV gram; b. 04/2017 Echo: EF 25-30%. diff HK. Gr2 DD. Mod dil LA/RV. PASP 52mmHg.  [name] COPD (chronic obstructive pulmonary disease) ([place])   . Coronary artery disease    a. 2013 S/P PCI of LAD [facility]);  b. 03/2013  PCI: RCA 90p ( 2.5 x 23 mm DES); c. 02/2014 Cath: patent RCA stent->Med Rx; d. 04/2017 Cath: LM nl, LAd 30ost/p, 37m/d, D1 80ost, LCX 90p/m, OM1 85, RCA 70/20p, 55m, 70d->Referred for CT Surg-felt to be poor candidate.  . Diabetic neuropathy ([place])   . Diabetic ulcer of right foot ([place])   . Hernia   . Hyperlipidemia   . Hypertension   . Ischemic cardiomyopathy    a. 04/2017 Echo: EF 25-30%; b. 08/2017 Cardiac MRI ([facility]): EF 19%, sev glob HK. RVEF 20%, mod BAE, triv MR, mild-mod TR. Basal lateral subendocardial infarct (viable), inf/infsept, dist septal ischemia, basal to mid lat peri-infarct ischemia.  . Morbid obesity ([place])   . Neuropathy   . PAD (peripheral artery disease) ([place])    a. Followed by Dr. Lucky Cowboy; b. 08/2010 Periph Angio: RSFA 70-80p (6X60 self-expanding stent); c. 09/2010 Periph Angio: L SFA 100p (7x unknown length self-expanding stent); d. 06/2015 Periph Angio: R SFA short segment occlusion (6x12 self-expanding stent).  . Restless leg syndrome   . Sleep apnea   . Subclavian artery stenosis, right ([place])   . Syncope and collapse   . Tobacco use    a. 75+ yr hx -  still smoking 1ppd, down from 2 ppd.  . Type II diabetes mellitus ([place])   . Varicose vein     Social History   Socioeconomic History  . Marital status: Single    Spouse name: Not on file  . Number of children: 0  . Years of education: 14  . Highest education level: 12th grade  Occupational History  . Occupation: on disability  Tobacco Use  . Smoking status: Current Every Day Smoker    Packs/day: 1.00    Years: 41.00    Pack years: 41.00    Types: Cigarettes  . Smokeless tobacco: Never Used  Vaping Use  . Vaping Use: Never used  Substance and Sexual Activity  . Alcohol use: No  . Drug use: No  . Sexual activity: Yes  Other Topics Concern  . Not on file  Social History Narrative   Lives at home in [place] with girlfriend. Independent at baseline.   Social Determinants of Health   Financial Resource  Strain:   . Difficulty of Paying Living Expenses: Not on file  Food Insecurity:   . Worried About Charity fundraiser in the Last Year: Not on file  . Ran Out of Food in the Last Year: Not on file  Transportation Needs:   . Lack of Transportation (Medical): Not on file  . Lack of Transportation (Non-Medical): Not on file  Physical Activity:   . Days of Exercise per Week: Not on file  . Minutes of Exercise per Session: Not on file  Stress:   . Feeling of Stress : Not on file  Social Connections:   . Frequency of Communication with Friends and Family: Not on file  . Frequency of Social Gatherings with Friends and Family: Not on file  . Attends Religious Services: Not on file  . Active Member of Clubs or Organizations: Not on file  . Attends Archivist Meetings: Not on file  . Marital Status: Not on file  Intimate Partner Violence:   . Fear of Current or Ex-Partner: Not on file  . Emotionally Abused: Not on file  . Physically Abused: Not on file  . Sexually Abused: Not on file    Past Surgical History:  Procedure Laterality Date  . ABDOMINAL AORTAGRAM N/A 06/23/2013   Procedure: ABDOMINAL Maxcine Ham;  Surgeon: Wellington Hampshire, MD;  Location: [place] CATH LAB;  Service: Cardiovascular;  Laterality: N/A;  . AMPUTATION Right 04/01/2018   Procedure: AMPUTATION BELOW KNEE;  Surgeon: Algernon Huxley, MD;  Location: [facility] ORS;  Service: Vascular;  Laterality: Right;  . APPENDECTOMY    . CARDIAC CATHETERIZATION  10/14   [facility]; X1 STENT PROXIMAL RCA  . CARDIAC CATHETERIZATION  02/2010   [facility]  . CARDIAC CATHETERIZATION  02/21/2014   [facility]  . CLAVICLE SURGERY    . HERNIA REPAIR    . IRRIGATION AND DEBRIDEMENT FOOT Right 01/04/2018   Procedure: IRRIGATION AND DEBRIDEMENT FOOT;  Surgeon: Samara Deist, DPM;  Location: [facility] ORS;  Service: Podiatry;  Laterality: Right;  . LEFT HEART CATH AND CORONARY ANGIOGRAPHY N/A 01/20/2020   Procedure: LEFT HEART CATH AND CORONARY ANGIOGRAPHY;  Surgeon:  Wellington Hampshire, MD;  Location: [place] CV LAB;  Service: Cardiovascular;  Laterality: N/A;  . LEFT HEART CATH AND CORS/GRAFTS ANGIOGRAPHY N/A 04/28/2017   Procedure: LEFT HEART CATH AND CORONARY ANGIOGRAPHY;  Surgeon: Wellington Hampshire, MD;  Location: [place] CV LAB;  Service: Cardiovascular;  Laterality: N/A;  . LOWER EXTREMITY ANGIOGRAPHY Right  03/03/2018   Procedure: LOWER EXTREMITY ANGIOGRAPHY;  Surgeon: Katha Cabal, MD;  Location: [place] CV LAB;  Service: Cardiovascular;  Laterality: Right;  . LOWER EXTREMITY ANGIOGRAPHY Left 08/24/2018   Procedure: LOWER EXTREMITY ANGIOGRAPHY;  Surgeon: Algernon Huxley, MD;  Location: [place] CV LAB;  Service: Cardiovascular;  Laterality: Left;  . LOWER EXTREMITY ANGIOGRAPHY Left 06/14/2019   Procedure: LOWER EXTREMITY ANGIOGRAPHY;  Surgeon: Algernon Huxley, MD;  Location: [place] CV LAB;  Service: Cardiovascular;  Laterality: Left;  . LOWER EXTREMITY ANGIOGRAPHY Left 08/02/2019   Procedure: LOWER EXTREMITY ANGIOGRAPHY;  Surgeon: Algernon Huxley, MD;  Location: [place] CV LAB;  Service: Cardiovascular;  Laterality: Left;  . PERIPHERAL ARTERIAL STENT GRAFT     x2 left/right  . PERIPHERAL VASCULAR BALLOON ANGIOPLASTY Right 01/06/2018   Procedure: PERIPHERAL VASCULAR BALLOON ANGIOPLASTY;  Surgeon: Katha Cabal, MD;  Location: [place] CV LAB;  Service: Cardiovascular;  Laterality: Right;  . PERIPHERAL VASCULAR CATHETERIZATION N/A 07/06/2015   Procedure: Abdominal Aortogram w/Lower Extremity;  Surgeon: Algernon Huxley, MD;  Location: [place] CV LAB;  Service: Cardiovascular;  Laterality: N/A;  . PERIPHERAL VASCULAR CATHETERIZATION  07/06/2015   Procedure: Lower Extremity Intervention;  Surgeon: Algernon Huxley, MD;  Location: [place] CV LAB;  Service: Cardiovascular;;    Family History  Problem Relation Age of Onset  . Heart attack Father   . Heart disease Father   . Hypertension Father   . Hyperlipidemia  Father   . Hypertension Mother   . Hyperlipidemia Mother     Allergies  Allergen Reactions  . Dulaglutide Anaphylaxis, Diarrhea and Hives  . Other Itching    Skin itching associated with nitro patch  . Prednisone Rash    CBC Latest Ref Rng & Units 03/26/2020 01/20/2020 01/19/2020  WBC 4.0 - 10.5 K/uL 6.1 4.7 6.3  Hemoglobin 13.0 - 17.0 g/dL 13.3 10.6(L) 12.1(L)  Hematocrit 39 - 52 % 41.2 32.0(L) 36.5(L)  Platelets 150 - 400 K/uL 177 153 177      CMP     Component Value Date/Time   NA 126 (L) 04/14/2020 0919   NA 134 11/03/2019 1406   NA 136 06/08/2014 1037   K 4.6 04/14/2020 0919   K 4.3 06/08/2014 1037   CL 86 (L) 04/14/2020 0919   CL 101 06/08/2014 1037   CO2 27 04/14/2020 0919   CO2 27 06/08/2014 1037   GLUCOSE 140 (H) 04/14/2020 0919   GLUCOSE 415 (H) 06/08/2014 1037   BUN 17 04/14/2020 0919   BUN 25 (H) 11/03/2019 1406   BUN 9 06/08/2014 1037   CREATININE 1.32 (H) 04/14/2020 0919   CREATININE 0.87 02/05/2018 1417   CALCIUM 9.4 04/14/2020 0919   CALCIUM 9.2 06/08/2014 1037   PROT 6.5 09/17/2019 0056   PROT 6.1 01/29/2018 1602   PROT 7.3 03/07/2014 1955   ALBUMIN 3.4 (L) 09/17/2019 0056   ALBUMIN 3.3 (L) 01/29/2018 1602   ALBUMIN 3.9 03/07/2014 1955   AST 23 09/17/2019 0056   AST 17 03/07/2014 1955   ALT 10 09/17/2019 0056   ALT 23 03/07/2014 1955   ALKPHOS 93 09/17/2019 0056   ALKPHOS 105 03/07/2014 1955   BILITOT 1.2 09/17/2019 0056   BILITOT 0.6 01/29/2018 1602   BILITOT 0.6 03/07/2014 1955   GFRNONAA >60 04/14/2020 0919   GFRNONAA >60 06/08/2014 1037   GFRNONAA >60 02/17/2014 0909   GFRAA >60 01/20/2020 0352   GFRAA >60 06/08/2014 1037   GFRAA >60 02/17/2014  0909         Assessment & Plan:   1. 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[place]) The patient's current prosthesis does not fit properly due to significant decrease in swelling following diuresis.  Because of this the patient has difficulty with ambulation.  Despite the use of  additional socks he still continues to be unsteady on his prosthesis.  It would be in the best interest of the patient to have a new prosthetic that has the appropriate improper fit.  2. Essential hypertension Continue antihypertensive medications as already ordered, these medications have been reviewed and there are no changes at this time.    Current Outpatient Medications on File Prior to Visit  Medication Sig Dispense Refill  . albuterol (VENTOLIN HFA) 108 (90 Base) MCG/ACT inhaler Inhale into the lungs every 6 (six) hours as needed for wheezing or shortness of breath.    [name] aspirin EC 81 MG tablet Take 1 tablet (81 mg total) by mouth daily. 90 tablet 3  . atorvastatin (LIPITOR) 80 MG tablet Take 80 mg by mouth daily.    . budesonide-formoterol (SYMBICORT) 80-4.5 MCG/ACT inhaler Inhale 2 puffs into the lungs as needed.    . cephALEXin (KEFLEX) 500 MG capsule Take 1 capsule (500 mg total) by mouth 3 (three) times daily. 30 capsule 1  . clopidogrel (PLAVIX) 75 MG tablet Take 75 mg by mouth daily.     . collagenase (SANTYL) ointment Apply 1 application topically daily. 90 g 1  . DULoxetine (CYMBALTA) 20 MG capsule Take 20 mg by mouth daily.    [name] ELIQUIS 5 MG TABS tablet Take 5 mg by mouth 2 (two) times daily.    . empagliflozin (JARDIANCE) 10 MG TABS tablet Take 10 mg by mouth daily.     [name] ezetimibe (ZETIA) 10 MG tablet Take 10 mg by mouth daily.    [name] gabapentin (NEURONTIN) 800 MG tablet Take 800 mg by mouth 4 (four) times daily.     [name] ipratropium-albuterol (DUONEB) 0.5-2.5 (3) MG/3ML SOLN Inhale 3 mLs into the lungs every 6 (six) hours as needed (wheezing/sob).     . isosorbide mononitrate (IMDUR) 30 MG 24 hr tablet Take 30 mg by mouth daily.    . Lactobacillus Acidophilus POWD Take by mouth.    . levofloxacin (LEVAQUIN) 500 MG tablet Take 1 tablet (500 mg total) by mouth daily. 10 tablet 0  . magnesium oxide (MAG-OX) 400 MG tablet Take 400 mg by mouth.     . metFORMIN (GLUCOPHAGE-XR) 500  MG 24 hr tablet Take 1,000 mg by mouth 2 (two) times daily.     . metoprolol succinate (TOPROL-XL) 25 MG 24 hr tablet Take 1 tablet (25 mg total) by mouth daily. 15 tablet 5  . mometasone-formoterol (DULERA) 200-5 MCG/ACT AERO Inhale into the lungs.    . Multiple Vitamin (MULTI-VITAMINS) TABS Take 1 tablet by mouth daily.     . mupirocin ointment (BACTROBAN) 2 % SMARTSIG:1 Application Topical 2-3 Times Daily    . mupirocin ointment (BACTROBAN) 2 % APPLY TO AFFECTED AREA(s) 3 TIMES DAILY FOR OPEN SORES    . naloxone (NARCAN) 4 MG/0.1ML LIQD nasal spray kit Place into the nose.    . naproxen (NAPROSYN) 500 MG tablet     . nitroGLYCERIN (NITROSTAT) 0.4 MG SL tablet Place 1 tablet (0.4 mg total) under the tongue every 5 (five) minutes as needed for chest pain. 30 tablet 5  . nystatin (MYCOSTATIN) 100000 UNIT/ML suspension Take 5 mLs by mouth  4 (four) times daily.    [name] omeprazole (PRILOSEC) 40 MG capsule Take 40 mg by mouth daily.    . Oxycodone HCl 10 MG TABS Take 10 mg by mouth every 6 (six) hours as needed (pain).     . pantoprazole (PROTONIX) 40 MG tablet Take 40 mg by mouth daily.    . potassium chloride (KLOR-CON) 10 MEQ tablet Take 1 tablet (10 mEq total) by mouth as directed. Take 1 tablet twice a day on days with metolazone take 4 tablets (40 mEq) 90 tablet 3  . promethazine (PHENERGAN) 25 MG tablet Take 25 mg by mouth every 6 (six) hours as needed.    . sacubitril-valsartan (ENTRESTO) 24-26 MG Take 1 tablet by mouth 2 (two) times daily. 60 tablet 2  . SYMBICORT 80-4.5 MCG/ACT inhaler Inhale 2 puffs into the lungs daily.     [name] torsemide (DEMADEX) 20 MG tablet Take 2 tablets (40 mg total) by mouth 2 (two) times daily. 120 tablet 5  . traZODone (DESYREL) 150 MG tablet Take 150 mg by mouth at bedtime.    [name] umeclidinium bromide (INCRUSE ELLIPTA) 62.5 MCG/INH AEPB Inhale 1 puff into the lungs daily.      No current facility-administered medications on file prior to visit.    There are no  Patient Instructions on file for this visit. No follow-ups on file.   Kris Hartmann, NP

## 2020-04-17 ENCOUNTER — Other Ambulatory Visit: Payer: Self-pay

## 2020-04-17 ENCOUNTER — Ambulatory Visit: Payer: Medicare Other | Attending: Family | Admitting: Family

## 2020-04-17 ENCOUNTER — Encounter: Payer: Self-pay | Admitting: Family

## 2020-04-17 VITALS — BP 114/78 | HR 88 | Resp 18 | Ht 67.0 in | Wt 210.0 lb

## 2020-04-17 DIAGNOSIS — I255 Ischemic cardiomyopathy: Secondary | ICD-10-CM | POA: Diagnosis not present

## 2020-04-17 DIAGNOSIS — Z6832 Body mass index (BMI) 32.0-32.9, adult: Secondary | ICD-10-CM | POA: Diagnosis not present

## 2020-04-17 DIAGNOSIS — E1140 Type 2 diabetes mellitus with diabetic neuropathy, unspecified: Secondary | ICD-10-CM | POA: Insufficient documentation

## 2020-04-17 DIAGNOSIS — E1151 Type 2 diabetes mellitus with diabetic peripheral angiopathy without gangrene: Secondary | ICD-10-CM | POA: Insufficient documentation

## 2020-04-17 DIAGNOSIS — Z7901 Long term (current) use of anticoagulants: Secondary | ICD-10-CM | POA: Insufficient documentation

## 2020-04-17 DIAGNOSIS — Z8249 Family history of ischemic heart disease and other diseases of the circulatory system: Secondary | ICD-10-CM | POA: Diagnosis not present

## 2020-04-17 DIAGNOSIS — E785 Hyperlipidemia, unspecified: Secondary | ICD-10-CM | POA: Diagnosis not present

## 2020-04-17 DIAGNOSIS — Z89511 Acquired absence of right leg below knee: Secondary | ICD-10-CM | POA: Diagnosis not present

## 2020-04-17 DIAGNOSIS — N400 Enlarged prostate without lower urinary tract symptoms: Secondary | ICD-10-CM | POA: Diagnosis not present

## 2020-04-17 DIAGNOSIS — Z888 Allergy status to other drugs, medicaments and biological substances status: Secondary | ICD-10-CM | POA: Insufficient documentation

## 2020-04-17 DIAGNOSIS — Z79899 Other long term (current) drug therapy: Secondary | ICD-10-CM | POA: Insufficient documentation

## 2020-04-17 DIAGNOSIS — F17210 Nicotine dependence, cigarettes, uncomplicated: Secondary | ICD-10-CM | POA: Insufficient documentation

## 2020-04-17 DIAGNOSIS — Z8349 Family history of other endocrine, nutritional and metabolic diseases: Secondary | ICD-10-CM | POA: Insufficient documentation

## 2020-04-17 DIAGNOSIS — Z7951 Long term (current) use of inhaled steroids: Secondary | ICD-10-CM | POA: Insufficient documentation

## 2020-04-17 DIAGNOSIS — I2510 Atherosclerotic heart disease of native coronary artery without angina pectoris: Secondary | ICD-10-CM | POA: Diagnosis not present

## 2020-04-17 DIAGNOSIS — J449 Chronic obstructive pulmonary disease, unspecified: Secondary | ICD-10-CM | POA: Insufficient documentation

## 2020-04-17 DIAGNOSIS — Z791 Long term (current) use of non-steroidal anti-inflammatories (NSAID): Secondary | ICD-10-CM | POA: Diagnosis not present

## 2020-04-17 DIAGNOSIS — Z720 Tobacco use: Secondary | ICD-10-CM

## 2020-04-17 DIAGNOSIS — G4733 Obstructive sleep apnea (adult) (pediatric): Secondary | ICD-10-CM | POA: Insufficient documentation

## 2020-04-17 DIAGNOSIS — Z7984 Long term (current) use of oral hypoglycemic drugs: Secondary | ICD-10-CM | POA: Diagnosis not present

## 2020-04-17 DIAGNOSIS — L97909 Non-pressure chronic ulcer of unspecified part of unspecified lower leg with unspecified severity: Secondary | ICD-10-CM

## 2020-04-17 DIAGNOSIS — Z955 Presence of coronary angioplasty implant and graft: Secondary | ICD-10-CM | POA: Diagnosis not present

## 2020-04-17 DIAGNOSIS — I10 Essential (primary) hypertension: Secondary | ICD-10-CM

## 2020-04-17 DIAGNOSIS — I70299 Other atherosclerosis of native arteries of extremities, unspecified extremity: Secondary | ICD-10-CM

## 2020-04-17 DIAGNOSIS — E1159 Type 2 diabetes mellitus with other circulatory complications: Secondary | ICD-10-CM

## 2020-04-17 DIAGNOSIS — Z7982 Long term (current) use of aspirin: Secondary | ICD-10-CM | POA: Insufficient documentation

## 2020-04-17 DIAGNOSIS — I5022 Chronic systolic (congestive) heart failure: Secondary | ICD-10-CM | POA: Diagnosis not present

## 2020-04-17 DIAGNOSIS — I110 Hypertensive heart disease with heart failure: Secondary | ICD-10-CM | POA: Insufficient documentation

## 2020-04-17 DIAGNOSIS — Z7902 Long term (current) use of antithrombotics/antiplatelets: Secondary | ICD-10-CM | POA: Diagnosis not present

## 2020-04-17 LAB — BASIC METABOLIC PANEL
Anion gap: 11 (ref 5–15)
BUN: 18 mg/dL (ref 6–20)
CO2: 28 mmol/L (ref 22–32)
Calcium: 8.6 mg/dL — ABNORMAL LOW (ref 8.9–10.3)
Chloride: 89 mmol/L — ABNORMAL LOW (ref 98–111)
Creatinine, Ser: 1.21 mg/dL (ref 0.61–1.24)
GFR, Estimated: >60 mL/min (ref >60)
Glucose, Bld: 130 mg/dL — ABNORMAL HIGH (ref 70–99)
Potassium: 3.5 mmol/L (ref 3.5–5.1)
Sodium: 128 mmol/L — ABNORMAL LOW (ref 135–145)

## 2020-04-17 LAB — BRAIN NATRIURETIC PEPTIDE: B Natriuretic Peptide: 1996.2 pg/mL — ABNORMAL HIGH (ref 0.0–100.0)

## 2020-04-17 NOTE — Patient Instructions (Signed)
Continue weighing daily and call for an overnight weight gain of > 2 pounds or a weekly weight gain of >5 pounds. 

## 2020-04-18 ENCOUNTER — Other Ambulatory Visit: Payer: Self-pay | Admitting: Family

## 2020-04-18 ENCOUNTER — Telehealth: Payer: Self-pay | Admitting: Family

## 2020-04-18 DIAGNOSIS — I5022 Chronic systolic (congestive) heart failure: Secondary | ICD-10-CM

## 2020-04-18 NOTE — Telephone Encounter (Signed)
Spoke with patient regarding BMP/BNP results obtained yesterday. Renal function and potassium are normal and sodium is slightly better (128) from previous result (126). BNP remains elevated at 1996.2 although is trending down.   Consulted with patient's cardiologist Rockey Situ) regarding plan as patient's weight had risen and he continues with lower extremity swelling. Patient confirms that he's decreased his sodium intake in half to ~ 1.5 L fluid/ day. Emphasized that he not drink anymore than that and that he can eat a small amount of salt such as 1/4 cup soup or some crackers.   Will schedule patient for 80mg  IV lasix/ 62meq PO potassium again. Patient says that he can't come in today and requests that it be scheduled tomorrow. Will call patient back once this is scheduled.

## 2020-04-19 ENCOUNTER — Other Ambulatory Visit: Payer: Self-pay

## 2020-04-19 ENCOUNTER — Ambulatory Visit
Admission: RE | Admit: 2020-04-19 | Discharge: 2020-04-19 | Disposition: A | Discharge status: Home / Self Care | Payer: Medicare Other | Source: Ambulatory Visit | Attending: Family | Admitting: Family

## 2020-04-19 ENCOUNTER — Other Ambulatory Visit: Payer: Self-pay | Admitting: Family

## 2020-04-19 ENCOUNTER — Telehealth: Payer: Self-pay | Admitting: Family

## 2020-04-19 DIAGNOSIS — I5022 Chronic systolic (congestive) heart failure: Secondary | ICD-10-CM | POA: Diagnosis not present

## 2020-04-19 DIAGNOSIS — I5023 Acute on chronic systolic (congestive) heart failure: Secondary | ICD-10-CM

## 2020-04-19 LAB — BASIC METABOLIC PANEL WITH GFR
Anion gap: 12 (ref 5–15)
BUN: 19 mg/dL (ref 6–20)
CO2: 26 mmol/L (ref 22–32)
Calcium: 8.8 mg/dL — ABNORMAL LOW (ref 8.9–10.3)
Chloride: 87 mmol/L — ABNORMAL LOW (ref 98–111)
Creatinine, Ser: 1.26 mg/dL — ABNORMAL HIGH (ref 0.61–1.24)
GFR, Estimated: >60 mL/min
Glucose, Bld: 149 mg/dL — ABNORMAL HIGH (ref 70–99)
Potassium: 4.1 mmol/L (ref 3.5–5.1)
Sodium: 125 mmol/L — ABNORMAL LOW (ref 135–145)

## 2020-04-19 LAB — BRAIN NATRIURETIC PEPTIDE: B Natriuretic Peptide: 2263.5 pg/mL — ABNORMAL HIGH (ref 0.0–100.0)

## 2020-04-19 MED ORDER — POTASSIUM CHLORIDE CRYS ER 20 MEQ PO TBCR: 40.0000 meq | EXTENDED_RELEASE_TABLET | Freq: Once | ORAL | Status: AC

## 2020-04-19 MED ORDER — SODIUM CHLORIDE 1 G PO TABS: 1.0000 g | ORAL_TABLET | Freq: Every day | ORAL | 3 refills | Status: DC

## 2020-04-19 MED ORDER — POTASSIUM CHLORIDE CRYS ER 20 MEQ PO TBCR
EXTENDED_RELEASE_TABLET | ORAL | Status: AC
  Administered 2020-04-19: 40 meq via ORAL
  Filled 2020-04-19: qty 2

## 2020-04-19 MED ORDER — FUROSEMIDE 10 MG/ML IJ SOLN
INTRAMUSCULAR | Status: AC
  Administered 2020-04-19: 80 mg via INTRAVENOUS
  Filled 2020-04-19: qty 4

## 2020-04-19 MED ORDER — FUROSEMIDE 10 MG/ML IJ SOLN: 80.0000 mg | Freq: Once | INTRAMUSCULAR | Status: AC

## 2020-04-19 NOTE — Telephone Encounter (Signed)
Called patient to review his lab results obtained earlier today after receiving 80mg  IV lasix/ 69meq PO potassium. Patient says that his symptoms are "a little better".   Sodium remains low at 125 with stable renal function. BNP 2263.5  Discussed with Dr. Rockey Situ and he advises to continue fluid restriction, take 1Gm salt tablet once daily and give another dose of IV lasix in 2 days. Need to also address long-term goals and palliative care.   Patient voices understanding of continued fluid restriction, the salt tablet and date/ time of next IV lasix.

## 2020-04-21 ENCOUNTER — Other Ambulatory Visit: Payer: Self-pay

## 2020-04-21 ENCOUNTER — Ambulatory Visit
Admission: RE | Admit: 2020-04-21 | Discharge: 2020-04-21 | Disposition: A | Discharge status: Home / Self Care | Payer: Medicare Other | Source: Ambulatory Visit | Attending: Family | Admitting: Family

## 2020-04-21 DIAGNOSIS — I5023 Acute on chronic systolic (congestive) heart failure: Secondary | ICD-10-CM | POA: Diagnosis not present

## 2020-04-21 LAB — BASIC METABOLIC PANEL
Anion gap: 12 (ref 5–15)
BUN: 21 mg/dL — ABNORMAL HIGH (ref 6–20)
CO2: 29 mmol/L (ref 22–32)
Calcium: 8.9 mg/dL (ref 8.9–10.3)
Chloride: 89 mmol/L — ABNORMAL LOW (ref 98–111)
Creatinine, Ser: 1.35 mg/dL — ABNORMAL HIGH (ref 0.61–1.24)
GFR, Estimated: >60 mL/min (ref >60)
Glucose, Bld: 141 mg/dL — ABNORMAL HIGH (ref 70–99)
Potassium: 4 mmol/L (ref 3.5–5.1)
Sodium: 130 mmol/L — ABNORMAL LOW (ref 135–145)

## 2020-04-21 LAB — BRAIN NATRIURETIC PEPTIDE: B Natriuretic Peptide: 2505.2 pg/mL — ABNORMAL HIGH (ref 0.0–100.0)

## 2020-04-21 MED ORDER — FUROSEMIDE 10 MG/ML IJ SOLN: 80.0000 mg | Freq: Once | INTRAMUSCULAR | Status: AC

## 2020-04-21 MED ORDER — POTASSIUM CHLORIDE CRYS ER 20 MEQ PO TBCR: 40.0000 meq | EXTENDED_RELEASE_TABLET | Freq: Once | ORAL | Status: AC

## 2020-04-21 MED ORDER — POTASSIUM CHLORIDE CRYS ER 20 MEQ PO TBCR
EXTENDED_RELEASE_TABLET | ORAL | Status: AC
  Administered 2020-04-21: 40 meq via ORAL
  Filled 2020-04-21: qty 2

## 2020-04-21 MED ORDER — FUROSEMIDE 10 MG/ML IJ SOLN
INTRAMUSCULAR | Status: AC
  Administered 2020-04-21: 80 mg via INTRAVENOUS
  Filled 2020-04-21: qty 8

## 2020-04-24 ENCOUNTER — Other Ambulatory Visit: Payer: Self-pay | Admitting: Family

## 2020-04-24 ENCOUNTER — Telehealth (INDEPENDENT_AMBULATORY_CARE_PROVIDER_SITE_OTHER): Payer: Self-pay

## 2020-04-24 DIAGNOSIS — I5023 Acute on chronic systolic (congestive) heart failure: Secondary | ICD-10-CM

## 2020-04-24 DIAGNOSIS — I5022 Chronic systolic (congestive) heart failure: Secondary | ICD-10-CM

## 2020-04-24 NOTE — Telephone Encounter (Signed)
The called and left a VM on the nurses line saying that he has sores on his Lt ankle . I called the pt and he made me aware that his ankle is swollen, and dark red he is not having any pain of numbness and isn't hot to the touch. Please advise.

## 2020-04-24 NOTE — Telephone Encounter (Signed)
Bring him in for wraps

## 2020-04-24 NOTE — Telephone Encounter (Signed)
Please call and schedule per the NP.

## 2020-04-25 ENCOUNTER — Ambulatory Visit
Admission: RE | Admit: 2020-04-25 | Discharge: 2020-04-25 | Disposition: A | Discharge status: Home / Self Care | Payer: Medicare Other | Source: Ambulatory Visit | Attending: Family | Admitting: Family

## 2020-04-25 ENCOUNTER — Encounter (INDEPENDENT_AMBULATORY_CARE_PROVIDER_SITE_OTHER): Payer: Medicare Other

## 2020-04-25 ENCOUNTER — Other Ambulatory Visit: Payer: Self-pay

## 2020-04-25 DIAGNOSIS — I5022 Chronic systolic (congestive) heart failure: Secondary | ICD-10-CM | POA: Diagnosis present

## 2020-04-25 LAB — BASIC METABOLIC PANEL
Anion gap: 12 (ref 5–15)
BUN: 16 mg/dL (ref 6–20)
CO2: 28 mmol/L (ref 22–32)
Calcium: 9 mg/dL (ref 8.9–10.3)
Chloride: 92 mmol/L — ABNORMAL LOW (ref 98–111)
Creatinine, Ser: 1.03 mg/dL (ref 0.61–1.24)
GFR, Estimated: >60 mL/min (ref >60)
Glucose, Bld: 93 mg/dL (ref 70–99)
Potassium: 4.5 mmol/L (ref 3.5–5.1)
Sodium: 132 mmol/L — ABNORMAL LOW (ref 135–145)

## 2020-04-25 LAB — BRAIN NATRIURETIC PEPTIDE: B Natriuretic Peptide: 1719.4 pg/mL — ABNORMAL HIGH (ref 0.0–100.0)

## 2020-04-25 MED ORDER — SODIUM CHLORIDE FLUSH 0.9 % IV SOLN
INTRAVENOUS | Status: AC
  Filled 2020-04-25: qty 20

## 2020-04-25 MED ORDER — FUROSEMIDE 10 MG/ML IJ SOLN: 80.0000 mg | Freq: Once | INTRAMUSCULAR | Status: AC

## 2020-04-25 MED ORDER — POTASSIUM CHLORIDE CRYS ER 20 MEQ PO TBCR
EXTENDED_RELEASE_TABLET | ORAL | Status: AC
  Administered 2020-04-25: 40 meq via ORAL
  Filled 2020-04-25: qty 2

## 2020-04-25 MED ORDER — FUROSEMIDE 10 MG/ML IJ SOLN
INTRAMUSCULAR | Status: AC
  Administered 2020-04-25: 80 mg via INTRAVENOUS
  Filled 2020-04-25: qty 8

## 2020-04-25 MED ORDER — POTASSIUM CHLORIDE CRYS ER 20 MEQ PO TBCR: 40.0000 meq | EXTENDED_RELEASE_TABLET | Freq: Once | ORAL | Status: AC

## 2020-04-25 NOTE — Telephone Encounter (Signed)
Called & scheduled patient 

## 2020-04-26 ENCOUNTER — Ambulatory Visit (INDEPENDENT_AMBULATORY_CARE_PROVIDER_SITE_OTHER): Payer: Medicare Other | Admitting: Nurse Practitioner

## 2020-04-26 ENCOUNTER — Telehealth: Payer: Self-pay | Admitting: Family

## 2020-04-26 DIAGNOSIS — L97929 Non-pressure chronic ulcer of unspecified part of left lower leg with unspecified severity: Secondary | ICD-10-CM

## 2020-04-26 DIAGNOSIS — I83029 Varicose veins of left lower extremity with ulcer of unspecified site: Secondary | ICD-10-CM | POA: Diagnosis not present

## 2020-04-26 MED ORDER — VERQUVO 10 MG PO TABS: 10.0000 mg | ORAL_TABLET | Freq: Every day | ORAL | 5 refills | Status: DC

## 2020-04-26 NOTE — Progress Notes (Signed)
History of Present Illness  There is no documented history at this time  Assessments & Plan   There are no diagnoses linked to this encounter.    Additional instructions  Subjective:  Patient presents with venous ulcer of the Left lower extremity.    Procedure:  3 layer unna wrap was placed Left lower extremity.   Plan:   Follow up in one week.  

## 2020-04-26 NOTE — Telephone Encounter (Signed)
Called and spoke with patient to see how he's feeling after getting IV lasix again yesterday. He says that he feels "about the same". Has received 5 doses of 80mg  IV lasix/ 95meq PO potassium in the last 2 weeks.   BNP is trending down (1719.4), sodium is improving (132) and renal function/ potassium are normal.   Discussed continuing with IV lasix or trying verquvo. Use of this medication was explained. Patient is agreeable to trying this medication. Will start him on 2.5mg  once daily with the plan to titrate up to 5mg  at his next appointment with me on 05/08/20. Attempt to get to goal dose of 10mg  daily.   Will probably need medication assistance so will work on that for him. Will send in the 10mg  dose so that PA could be done (if needed) and pharmacy can obtain medication.

## 2020-04-30 ENCOUNTER — Encounter (INDEPENDENT_AMBULATORY_CARE_PROVIDER_SITE_OTHER): Payer: Self-pay | Admitting: Nurse Practitioner

## 2020-05-02 ENCOUNTER — Encounter (INDEPENDENT_AMBULATORY_CARE_PROVIDER_SITE_OTHER): Payer: Self-pay

## 2020-05-02 ENCOUNTER — Other Ambulatory Visit: Payer: Self-pay

## 2020-05-02 ENCOUNTER — Ambulatory Visit (INDEPENDENT_AMBULATORY_CARE_PROVIDER_SITE_OTHER): Payer: Medicare Other | Admitting: Nurse Practitioner

## 2020-05-02 VITALS — BP 129/87 | HR 94 | Resp 16

## 2020-05-02 DIAGNOSIS — I83029 Varicose veins of left lower extremity with ulcer of unspecified site: Secondary | ICD-10-CM

## 2020-05-02 DIAGNOSIS — L97929 Non-pressure chronic ulcer of unspecified part of left lower leg with unspecified severity: Secondary | ICD-10-CM

## 2020-05-02 NOTE — Progress Notes (Signed)
History of Present Illness  There is no documented history at this time  Assessments & Plan   There are no diagnoses linked to this encounter.    Additional instructions  Subjective:  Patient presents with venous ulcer of the Left lower extremity.    Procedure:  3 layer unna wrap was placed Left lower extremity.   Plan:   Follow up in one week.  

## 2020-05-06 ENCOUNTER — Encounter (INDEPENDENT_AMBULATORY_CARE_PROVIDER_SITE_OTHER): Payer: Self-pay | Admitting: Nurse Practitioner

## 2020-05-08 ENCOUNTER — Ambulatory Visit: Payer: Medicare Other | Admitting: Family

## 2020-05-08 NOTE — Progress Notes (Deleted)
Patient ID: [name], male    DOB: Jun 01, 1961, 59 y.o.   MRN: [id number]  HPI  [name] is a 59 y/o male with a history of obstructive sleep apnea, PVD, PAD, HTN, hyperlipidemia, DM, CAD, COPD, BPH, asthma, current tobacco use and chronic heart failure.   Echo report from 01/20/20 reviewed and showed an EF of <20% along with mild MR and moderate TR. Echo report from 09/29/19 reviewed and showed an EF of 20-25% along with moderately elevated PA pressure, mild MR and mild/moderate TR. Echo report from 11/06/2018 reviewed and showed an EF of 20-25% along with moderate TR and a PA pressure of 59.6 mmHg. Echo report from 04/25/17 reviewed and shows an EF of 25-30% along with a PA pressure of 34 mm Hg. EF has declined from 55-60% back in 2016.   LHC done 01/20/20 showed:  Prox RCA-1 lesion is 80% stenosed.  Prox RCA-2 lesion is 20% stenosed.  Prox RCA to Mid RCA lesion is 80% stenosed.  Prox LAD lesion is 20% stenosed.  1st Diag lesion is 80% stenosed.  Mid LAD lesion is 20% stenosed.  Dist LAD lesion is 60% stenosed.  Prox Cx to Mid Cx lesion is 90% stenosed.  LPAV lesion is 95% stenosed.   1. Significant underlying three-vessel coronary artery disease. Patent proximal LAD stent with mild in-stent restenosis. Moderate diffuse disease in the distal LAD. Significant stenosis in the proximal left circumflex at the origin of the posterior AV groove artery which is also heavily diseased at the ostium. This is a bifurcation lesion and heavily calcified. RCA stent is patent. However, there is significant proximal disease in the whole mid to distal segment is diffusely diseased and small caliber. 2. Left ventricular angiography was not performed. EF was severe reduced by echo. 3. Moderately elevated left ventricular end-diastolic pressure at 28 mmHg  Cardiac catheterization done 04/28/17 showed significant three-vessel disease with a patient stent in the RCA and LAD. Significant proximal RCS  disease and the stent as well as diffuse mid and distal disease. LAD has moderate disease. Severely elevated left ventricular end-diastolic pressure at 34 mmHg. Possible CABG in the future with optimizing medical management. Stress done in 2015.  Was in the ED 03/26/20 due to right shoulder pain where he was evaluated and released.   He presents today for a follow-up visit with a chief complaint of   Verguvo 2.5mg  daily was started 04/26/20.   Past Medical History:  Diagnosis Date  . Arthritis   . Asthma   . Atherosclerosis   . BPH (benign prostatic hyperplasia)   . Carotid arterial disease ([name])   . Charcot's joint of foot, right   . Chronic combined systolic (congestive) and diastolic (congestive) heart failure ([name])    a. 02/2013 EF 50% by LV gram; b. 04/2017 Echo: EF 25-30%. diff HK. Gr2 DD. Mod dil [name]/RV. PASP 38mmHg.  [name] COPD (chronic obstructive pulmonary disease) ([name])   . Coronary artery disease    a. 2013 S/P PCI of LAD [facility]);  b. 03/2013 PCI: RCA 90p ( 2.5 x 23 mm DES); c. 02/2014 Cath: patent RCA stent->Med Rx; d. 04/2017 Cath: LM nl, LAd 30ost/p, 67m/d, D1 80ost, LCX 90p/m, OM1 85, RCA 70/20p, 60m, 70d->Referred for CT Surg-felt to be poor candidate.  . Diabetic neuropathy ([name])   . Diabetic ulcer of right foot ([name])   . Hernia   . Hyperlipidemia   . Hypertension   . Ischemic cardiomyopathy    a. 04/2017  Echo: EF 25-30%; b. 08/2017 Cardiac MRI ([facility]): EF 19%, sev glob HK. RVEF 20%, mod BAE, triv MR, mild-mod TR. Basal lateral subendocardial infarct (viable), inf/infsept, dist septal ischemia, basal to mid lat peri-infarct ischemia.  . Morbid obesity ([name])   . Neuropathy   . PAD (peripheral artery disease) ([name])    a. Followed by Dr. Lucky Cowboy; b. 08/2010 Periph Angio: RSFA 70-80p (6X60 self-expanding stent); c. 09/2010 Periph Angio: L SFA 100p (7x unknown length self-expanding stent); d. 06/2015 Periph Angio: R SFA short segment occlusion (6x12 self-expanding stent).  . Restless leg  syndrome   . Sleep apnea   . Subclavian artery stenosis, right ([name])   . Syncope and collapse   . Tobacco use    a. 75+ yr hx - still smoking 1ppd, down from 2 ppd.  . Type II diabetes mellitus ([name])   . Varicose vein    Past Surgical History:  Procedure Laterality Date  . ABDOMINAL AORTAGRAM N/A 06/23/2013   Procedure: ABDOMINAL Maxcine Ham;  Surgeon: Wellington Hampshire, MD;  Location: [place] CATH [facility];  Service: Cardiovascular;  Laterality: N/A;  . AMPUTATION Right 04/01/2018   Procedure: AMPUTATION BELOW KNEE;  Surgeon: Algernon Huxley, MD;  Location: [name];  Service: Vascular;  Laterality: Right;  . APPENDECTOMY    . CARDIAC CATHETERIZATION  10/14   [name]; X1 STENT PROXIMAL RCA  . CARDIAC CATHETERIZATION  02/2010   [name]  . CARDIAC CATHETERIZATION  02/21/2014   [name]  . CLAVICLE SURGERY    . HERNIA REPAIR    . IRRIGATION AND DEBRIDEMENT FOOT Right 01/04/2018   Procedure: IRRIGATION AND DEBRIDEMENT FOOT;  Surgeon: Samara Deist, DPM;  Location: [name];  Service: Podiatry;  Laterality: Right;  . LEFT HEART CATH AND CORONARY ANGIOGRAPHY N/A 01/20/2020   Procedure: LEFT HEART CATH AND CORONARY ANGIOGRAPHY;  Surgeon: Wellington Hampshire, MD;  Location: [name] [place] [facility];  Service: Cardiovascular;  Laterality: N/A;  . LEFT HEART CATH AND CORS/GRAFTS ANGIOGRAPHY N/A 04/28/2017   Procedure: LEFT HEART CATH AND CORONARY ANGIOGRAPHY;  Surgeon: Wellington Hampshire, MD;  Location: [place] [facility];  Service: Cardiovascular;  Laterality: N/A;  . LOWER EXTREMITY ANGIOGRAPHY Right 03/03/2018   Procedure: LOWER EXTREMITY ANGIOGRAPHY;  Surgeon: Katha Cabal, MD;  Location: [place] CV LAB;  Service: Cardiovascular;  Laterality: Right;  . LOWER EXTREMITY ANGIOGRAPHY Left 08/24/2018   Procedure: LOWER EXTREMITY ANGIOGRAPHY;  Surgeon: Algernon Huxley, MD;  Location: [place] [facility];  Service: Cardiovascular;  Laterality: Left;  . LOWER EXTREMITY ANGIOGRAPHY Left 06/14/2019   Procedure: LOWER  EXTREMITY ANGIOGRAPHY;  Surgeon: Algernon Huxley, MD;  Location: [place] CV LAB;  Service: Cardiovascular;  Laterality: Left;  . LOWER EXTREMITY ANGIOGRAPHY Left 08/02/2019   Procedure: LOWER EXTREMITY ANGIOGRAPHY;  Surgeon: Algernon Huxley, MD;  Location: [place] CV LAB;  Service: Cardiovascular;  Laterality: Left;  . PERIPHERAL ARTERIAL STENT GRAFT     x2 left/right  . PERIPHERAL VASCULAR BALLOON ANGIOPLASTY Right 01/06/2018   Procedure: PERIPHERAL VASCULAR BALLOON ANGIOPLASTY;  Surgeon: Katha Cabal, MD;  Location: [place] CV [facility];  Service: Cardiovascular;  Laterality: Right;  . PERIPHERAL VASCULAR CATHETERIZATION N/A 07/06/2015   Procedure: Abdominal Aortogram w/Lower Extremity;  Surgeon: Algernon Huxley, MD;  Location: [place] [facility];  Service: Cardiovascular;  Laterality: N/A;  . PERIPHERAL VASCULAR CATHETERIZATION  07/06/2015   Procedure: Lower Extremity Intervention;  Surgeon: Algernon Huxley, MD;  Location: [place] CV [facility];  Service: Cardiovascular;;  Family History  Problem Relation Age of Onset  . Heart attack Father   . Heart disease Father   . Hypertension Father   . Hyperlipidemia Father   . Hypertension Mother   . Hyperlipidemia Mother    Social History   Tobacco Use  . Smoking status: Current Every Day Smoker    Packs/day: 1.00    Years: 41.00    Pack years: 41.00    Types: Cigarettes  . Smokeless tobacco: Never Used  Substance Use Topics  . Alcohol use: No   Allergies  Allergen Reactions  . Dulaglutide Anaphylaxis, Diarrhea and Hives  . Other Itching    Skin itching associated with nitro patch  . Prednisone Rash     Review of Systems  Constitutional: Positive for fatigue. Negative for appetite change.  HENT: Positive for rhinorrhea. Negative for congestion, postnasal drip and sore throat.   Eyes: Negative.   Respiratory: Positive for cough and shortness of breath (unchanged). Negative for chest tightness and wheezing.   Cardiovascular:  Positive for leg swelling (left lower leg: improving). Negative for chest pain and palpitations.  Gastrointestinal: Positive for abdominal distention. Negative for abdominal pain.  Endocrine: Negative.   Genitourinary: Negative.   Musculoskeletal: Positive for back pain. Negative for neck pain.       Has right lower leg prosthesis  Skin: Positive for wound (left shin).  Allergic/Immunologic: Negative.   Neurological: Positive for headaches. Negative for dizziness and light-headedness.  Hematological: Negative for adenopathy. Bruises/bleeds easily.  Psychiatric/Behavioral: Negative for dysphoric mood and sleep disturbance (sleeping on 2 pillows). The patient is not nervous/anxious.      Physical Exam Vitals and nursing note reviewed.  Constitutional:      Appearance: He is well-developed.  HENT:     Head: Normocephalic and atraumatic.  Neck:     Vascular: No JVD.  Cardiovascular:     Rate and Rhythm: Normal rate and regular rhythm.  Pulmonary:     Effort: Pulmonary effort is normal.     Breath sounds: Examination of the left-lower field reveals rales. Rhonchi (lower lobes) and rales present. No wheezing.  Abdominal:     General: There is distension.     Palpations: Abdomen is soft.     Tenderness: There is no abdominal tenderness.  Musculoskeletal:        General: Deformity (right leg prosthesis present) present. No tenderness.     Cervical back: Normal range of motion and neck supple.     Left lower leg: No tenderness. Edema (2+ pitting) present.  Skin:    General: Skin is warm and dry.     Findings: No erythema.  Neurological:     Mental Status: He is alert and oriented to person, place, and time.  Psychiatric:        Behavior: Behavior normal.        Thought Content: Thought content normal.     Assessment & Plan:  1: Chronic heart failure with reduced ejection fraction- - NYHA class III - moderately fluid overloaded today with continued pedal edema and weight  gain - weighing daily; reminded to call for an overnight weight gain of >2 pounds or a weekly weight gain of >5 pounds - weight 210 pounds from last visit 3 weeks ago - verquvo 2.5mg  started 04/26/20 - get BMP/ BNP today today - not adding salt and has been using Mrs. Dash & trying to read food labels.  - had been drinking ~ 3L of fluids daily; has tried to  decrease that over the last few days - saw cardiology Rockey Situ) 04/04/20 & returns 05/16/20 - BNP 04/25/20 was 1719.4 - participating in paramedicine program - not interested in COVID vaccine at this time  2: HTN- - BP  - saw PCP Dr. 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

## 2020-05-10 ENCOUNTER — Ambulatory Visit (INDEPENDENT_AMBULATORY_CARE_PROVIDER_SITE_OTHER): Payer: Medicare Other | Admitting: Nurse Practitioner

## 2020-05-10 ENCOUNTER — Encounter (INDEPENDENT_AMBULATORY_CARE_PROVIDER_SITE_OTHER): Payer: Self-pay

## 2020-05-10 ENCOUNTER — Other Ambulatory Visit: Payer: Self-pay

## 2020-05-10 VITALS — BP 108/73 | HR 72 | Resp 16

## 2020-05-10 DIAGNOSIS — L97929 Non-pressure chronic ulcer of unspecified part of left lower leg with unspecified severity: Secondary | ICD-10-CM | POA: Diagnosis not present

## 2020-05-10 DIAGNOSIS — I83029 Varicose veins of left lower extremity with ulcer of unspecified site: Secondary | ICD-10-CM

## 2020-05-10 NOTE — Progress Notes (Signed)
History of Present Illness  There is no documented history at this time  Assessments & Plan   There are no diagnoses linked to this encounter.    Additional instructions  Subjective:  Patient presents with venous ulcer of the Left lower extremity.    Procedure:  3 layer unna wrap was placed Left lower extremity.   Plan:   Follow up in one week.  

## 2020-05-10 NOTE — Progress Notes (Signed)
Patient ID: [name] [name], male    DOB: 03-May-1961, 59 y.o.   MRN: [id number]  HPI  Mr Strohm is a 59 y/o male with a history of obstructive sleep apnea, PVD, PAD, HTN, hyperlipidemia, DM, CAD, COPD, BPH, asthma, current tobacco use and chronic heart failure.   Echo report from 01/20/20 reviewed and showed an EF of <20% along with mild MR and moderate TR. Echo report from 09/29/19 reviewed and showed an EF of 20-25% along with moderately elevated PA pressure, mild MR and mild/moderate TR. Echo report from 11/06/2018 reviewed and showed an EF of 20-25% along with moderate TR and a PA pressure of 59.6 mmHg. Echo report from 04/25/17 reviewed and shows an EF of 25-30% along with a PA pressure of 34 mm Hg. EF has declined from 55-60% back in 2016.   LHC done 01/20/20 showed:  Prox RCA-1 lesion is 80% stenosed.  Prox RCA-2 lesion is 20% stenosed.  Prox RCA to Mid RCA lesion is 80% stenosed.  Prox LAD lesion is 20% stenosed.  1st Diag lesion is 80% stenosed.  Mid LAD lesion is 20% stenosed.  Dist LAD lesion is 60% stenosed.  Prox Cx to Mid Cx lesion is 90% stenosed.  LPAV lesion is 95% stenosed.   1. Significant underlying three-vessel coronary artery disease. Patent proximal LAD stent with mild in-stent restenosis. Moderate diffuse disease in the distal LAD. Significant stenosis in the proximal left circumflex at the origin of the posterior AV groove artery which is also heavily diseased at the ostium. This is a bifurcation lesion and heavily calcified. RCA stent is patent. However, there is significant proximal disease in the whole mid to distal segment is diffusely diseased and small caliber. 2. Left ventricular angiography was not performed. EF was severe reduced by echo. 3. Moderately elevated left ventricular end-diastolic pressure at 28 mmHg  Cardiac catheterization done 04/28/17 showed significant three-vessel disease with a patient stent in the RCA and LAD. Significant proximal RCS  disease and the stent as well as diffuse mid and distal disease. LAD has moderate disease. Severely elevated left ventricular end-diastolic pressure at 34 mmHg. Possible CABG in the future with optimizing medical management. Stress done in 2015.  Was in the ED 03/26/20 due to right shoulder pain where he was evaluated and released.   He presents today for a follow-up visit with a chief complaint of moderate shortness of breath upon minimal exertion. He describes this as chronic in nature having been present for several years. He has associated fatigue, cough, wheezing, headaches, light-headedness, left leg edema, abdominal distention, chronic back pain and weight gain along with this. He denies any difficulty sleeping, palpitations or chest pain.  Verguvo 2.57m daily was started 04/26/20 and he says that he feels "a little" better. Significant other says that he hasn't been weighing himself daily.   Currently going to the wound center on a weekly basis to have left leg wrapped due to wound.   Unclear of exactly what medication he's taking as he says that he's not going to take "all that stuff" and is taking his diabetes and heart medications because everything else together makes him feel bad.    Past Medical History:  Diagnosis Date  . Arthritis   . Asthma   . Atherosclerosis   . BPH (benign prostatic hyperplasia)   . Carotid arterial disease ([place]   . Charcot's joint of foot, right   . Chronic combined systolic (congestive) and diastolic (congestive) heart failure (HCC)  a. 02/2013 EF 50% by LV gram; b. 04/2017 Echo: EF 25-30%. diff HK. Gr2 DD. Mod dil LA/RV. PASP 37mHg.  .Marland KitchenCOPD (chronic obstructive pulmonary disease) ([place]   . Coronary artery disease    a. 2013 S/P PCI of LAD ([facility];  b. 03/2013 PCI: RCA 90p ( 2.5 x 23 mm DES); c. 02/2014 Cath: patent RCA stent->Med Rx; d. 04/2017 Cath: LM nl, LAd 30ost/p, 630m, D1 80ost, LCX 90p/m, OM1 85, RCA 70/20p, 8055m0d->Referred for CT Surg-felt  to be poor candidate.  . Diabetic neuropathy ([place]CC[place] . Diabetic ulcer of right foot (HCC[place] . Hernia   . Hyperlipidemia   . Hypertension   . Ischemic cardiomyopathy    a. 04/2017 Echo: EF 25-30%; b. 08/2017 Cardiac MRI ([facility]): EF 19%, sev glob HK. RVEF 20%, mod BAE, triv MR, mild-mod TR. Basal lateral subendocardial infarct (viable), inf/infsept, dist septal ischemia, basal to mid lat peri-infarct ischemia.  . Morbid obesity ([place]CCDeW[place] . Neuropathy   . PAD (peripheral artery disease) ([place]CC[place]  a. Followed by Dr. DewLucky Cowboy. 08/2010 Periph Angio: RSFA 70-80p (6X60 self-expanding stent); c. 09/2010 Periph Angio: L SFA 100p (7x unknown length self-expanding stent); d. 06/2015 Periph Angio: R SFA short segment occlusion (6x12 self-expanding stent).  . Restless leg syndrome   . Sleep apnea   . Subclavian artery stenosis, right ([place]CC[place] . Syncope and collapse   . Tobacco use    a. 75+ yr hx - still smoking 1ppd, down from 2 ppd.  . Type II diabetes mellitus (HCCYork . Varicose vein    Past Surgical History:  Procedure Laterality Date  . ABDOMINAL AORTAGRAM N/A 06/23/2013   Procedure: ABDOMINAL AORMaxcine HamSurgeon: MuhWellington HampshireD;  [facility];  Service: Cardiovascular;  Laterality: N/A;  . AMPUTATION Right 04/01/2018   Procedure: AMPUTATION BELOW KNEE;  Surgeon: DewAlgernon HuxleyD;  Location: [facility];  Service: Vascular;  Laterality: Right;  . APPENDECTOMY    . CARDIAC CATHETERIZATION  10/14   [facility]; X1 STENT PROXIMAL RCA  . CARDIAC CATHETERIZATION  02/2010   [facility]  . CARDIAC CATHETERIZATION  02/21/2014   [facility]  . CLAVICLE SURGERY    . HERNIA REPAIR    . IRRIGATION AND DEBRIDEMENT FOOT Right 01/04/2018   Procedure: IRRIGATION AND DEBRIDEMENT FOOT;  Surgeon: FowSamara DeistPM;  Location: [facility];  Service: Podiatry;  Laterality: Right;  . LEFT HEART CATH AND CORONARY ANGIOGRAPHY N/A 01/20/2020   Procedure: LEFT HEART CATH AND CORONARY ANGIOGRAPHY;  Surgeon: AriWellington HampshireD;   [facility];  Service: Cardiovascular;  Laterality: N/A;  . LEFT HEART CATH AND CORS/GRAFTS ANGIOGRAPHY N/A 04/28/2017   Procedure: LEFT HEART CATH AND CORONARY ANGIOGRAPHY;  Surgeon: AriWellington HampshireD;  [facility];  Service: Cardiovascular;  Laterality: N/A;  . LOWER EXTREMITY ANGIOGRAPHY Right 03/03/2018   Procedure: LOWER EXTREMITY ANGIOGRAPHY;  Surgeon: SchKatha CabalD;  [facility];  Service: Cardiovascular;  Laterality: Right;  . LOWER EXTREMITY ANGIOGRAPHY Left 08/24/2018   Procedure: LOWER EXTREMITY ANGIOGRAPHY;  Surgeon: DewAlgernon HuxleyD;  [facility];  Service: Cardiovascular;  Laterality: Left;  . LOWER EXTREMITY ANGIOGRAPHY Left 06/14/2019   Procedure: LOWER EXTREMITY ANGIOGRAPHY;  Surgeon: DewAlgernon HuxleyD;  [facility];  Service: Cardiovascular;  Laterality: Left;  . LOWER EXTREMITY ANGIOGRAPHY Left 08/02/2019   Procedure: LOWER EXTREMITY ANGIOGRAPHY;  Surgeon: Algernon Huxley, MD;  Location: [place] [facility];  Service: Cardiovascular;  Laterality: Left;  . PERIPHERAL ARTERIAL STENT GRAFT     x2 left/right  . PERIPHERAL VASCULAR BALLOON ANGIOPLASTY Right 01/06/2018   Procedure: PERIPHERAL VASCULAR BALLOON ANGIOPLASTY;  Surgeon: Katha Cabal, MD;  Location: [place] [facility];  Service: Cardiovascular;  Laterality: Right;  . PERIPHERAL VASCULAR CATHETERIZATION N/A 07/06/2015   Procedure: Abdominal Aortogram w/Lower Extremity;  Surgeon: Algernon Huxley, MD;  Location: [place] [facility];  Service: Cardiovascular;  Laterality: N/A;  . PERIPHERAL VASCULAR CATHETERIZATION  07/06/2015   Procedure: Lower Extremity Intervention;  Surgeon: Algernon Huxley, MD;  Location: [place] [facility];  Service: Cardiovascular;;   Family History  Problem Relation Age of Onset  . Heart attack Father   . Heart disease Father   . Hypertension Father   . Hyperlipidemia Father   . Hypertension Mother    . Hyperlipidemia Mother    Social History   Tobacco Use  . Smoking status: Current Every Day Smoker    Packs/day: 1.00    Years: 41.00    Pack years: 41.00    Types: Cigarettes  . Smokeless tobacco: Never Used  Substance Use Topics  . Alcohol use: No   Allergies  Allergen Reactions  . Dulaglutide Anaphylaxis, Diarrhea and Hives  . Other Itching    Skin itching associated with nitro patch  . Prednisone Rash   Prior to Admission medications   Medication Sig Start Date End Date Taking? Authorizing Provider  albuterol (VENTOLIN HFA) 108 (90 Base) MCG/ACT inhaler Inhale into the lungs every 6 (six) hours as needed for wheezing or shortness of breath.   Yes [provider]  amiodarone (PACERONE) 200 MG tablet Take 200 mg by mouth daily.   Yes [provider]  aspirin EC 81 MG tablet Take 1 tablet (81 mg total) by mouth daily. 05/11/15  Yes Wellington Hampshire, MD  atorvastatin (LIPITOR) 80 MG tablet Take 80 mg by mouth daily.   Yes [provider]  budesonide-formoterol (SYMBICORT) 80-4.5 MCG/ACT inhaler Inhale 2 puffs into the lungs as needed.   Yes [provider]  clopidogrel (PLAVIX) 75 MG tablet Take 75 mg by mouth daily.  08/22/17  Yes [provider]  collagenase (SANTYL) ointment Apply 1 application topically daily. 08/11/19  Yes Kris Hartmann, NP  DULoxetine (CYMBALTA) 20 MG capsule Take 30 mg by mouth daily.    Yes [provider]  ELIQUIS 5 MG TABS tablet Take 5 mg by mouth daily.  02/02/20  Yes [provider]  empagliflozin (JARDIANCE) 10 MG TABS tablet Take 10 mg by mouth daily.  02/03/20  Yes [provider]  ezetimibe (ZETIA) 10 MG tablet Take 10 mg by mouth daily.    Yes [provider]  gabapentin (NEURONTIN) 800 MG tablet Take 800 mg by mouth 4 (four) times daily.  08/14/17 04/04/29 Yes [provider]  Ipratropium-Albuterol (COMBIVENT RESPIMAT) 20-100 MCG/ACT AERS respimat Inhale 1  puff into the lungs every 6 (six) hours as needed for wheezing.   Yes [provider]  ipratropium-albuterol (DUONEB) 0.5-2.5 (3) MG/3ML SOLN Inhale 3 mLs into the lungs every 6 (six) hours as needed (wheezing/sob).    Yes [provider]  isosorbide mononitrate (IMDUR) 30 MG 24 hr tablet Take 30 mg by mouth daily.   Yes [provider]  Lactobacillus Acidophilus POWD Take by mouth.   Yes [provider]  magnesium oxide (  MAG-OX) 400 MG tablet Take 400 mg by mouth.  02/03/20  Yes [provider]  metFORMIN (GLUCOPHAGE-XR) 500 MG 24 hr tablet Take 1,000 mg by mouth 2 (two) times daily.    Yes [provider]  metoprolol succinate (TOPROL-XL) 25 MG 24 hr tablet Take 1 tablet (25 mg total) by mouth daily. 07/02/19 08/29/20 Yes Loel Dubonnet, NP  mometasone-formoterol (DULERA) 200-5 MCG/ACT AERO Inhale 2 puffs into the lungs as needed.    Yes [provider]  Multiple Vitamin (MULTI-VITAMINS) TABS Take 1 tablet by mouth daily.    Yes [provider]  mupirocin ointment (BACTROBAN) 2 % SMARTSIG:1 Application Topical 2-3 Times Daily 03/13/20  Yes [provider]  naloxone (NARCAN) 4 MG/0.1ML LIQD nasal spray kit Place into the nose. 03/03/20  Yes [provider]  naproxen (NAPROSYN) 500 MG tablet  03/09/19  Yes [provider]  nitroGLYCERIN (NITROSTAT) 0.4 MG SL tablet Place 1 tablet (0.4 mg total) under the tongue every 5 (five) minutes as needed for chest pain. 05/19/19  Yes Dorianne Perret, Otila Kluver A, FNP  nystatin (MYCOSTATIN) 100000 UNIT/ML suspension Take 5 mLs by mouth 4 (four) times daily. 01/01/20  Yes [provider]  omeprazole (PRILOSEC) 40 MG capsule Take 40 mg by mouth daily.   Yes [provider]  Oxycodone HCl 10 MG TABS Take 10 mg by mouth every 6 (six) hours as needed (pain).  01/15/18  Yes [provider]  pantoprazole (PROTONIX) 40 MG tablet Take 40 mg by mouth daily. 02/02/20   Yes [provider]  potassium chloride (KLOR-CON) 10 MEQ tablet Take 1 tablet (10 mEq total) by mouth as directed. Take 1 tablet twice a day on days with metolazone take 4 tablets (40 mEq) Patient taking differently: Take 10 mEq by mouth 2 (two) times daily.  10/04/19 02/28/29 Yes Gollan, Kathlene November, MD  promethazine (PHENERGAN) 25 MG tablet Take 25 mg by mouth every 6 (six) hours as needed.  01/01/20  Yes [provider]  sacubitril-valsartan (ENTRESTO) 24-26 MG Take 1 tablet by mouth 2 (two) times daily. Patient taking differently: Take 1 tablet by mouth daily.  09/30/19  Yes Shahmehdi, Seyed A, MD  sodium chloride 1 g tablet Take 1 tablet (1 g total) by mouth daily. 04/19/20  Yes Alisa Graff, FNP  SYMBICORT 80-4.5 MCG/ACT inhaler Inhale 2 puffs into the lungs daily.  01/01/20  Yes [provider]  torsemide (DEMADEX) 20 MG tablet Take 2 tablets (40 mg total) by mouth 2 (two) times daily. 03/03/20 06/01/20 Yes Gollan, Kathlene November, MD  traZODone (DESYREL) 150 MG tablet Take 150 mg by mouth at bedtime. 07/12/19  Yes [provider]  umeclidinium bromide (INCRUSE ELLIPTA) 62.5 MCG/INH AEPB Inhale 1 puff into the lungs daily.  02/03/20  Yes [provider]  Vericiguat (VERQUVO) 10 MG TABS Take 10 mg by mouth daily. Patient taking differently: Take 2.5 mg by mouth daily.  04/26/20  Yes Alisa Graff, FNP     Review of Systems  Constitutional: Positive for fatigue. Negative for appetite change.  HENT: Positive for rhinorrhea. Negative for congestion, postnasal drip and sore throat.   Eyes: Negative.   Respiratory: Positive for cough, shortness of breath (unchanged) and wheezing. Negative for chest tightness.   Cardiovascular: Positive for leg swelling (left lower leg). Negative for chest pain and palpitations.  Gastrointestinal: Positive for abdominal distention. Negative for abdominal pain.  Endocrine: Negative.   Genitourinary: Negative.    Musculoskeletal: Positive for  back pain. Negative for neck pain.       Has right lower leg prosthesis  Skin: Positive for wound (left shin).  Allergic/Immunologic: Negative.   Neurological: Positive for light-headedness (yesterday) and headaches. Negative for dizziness.  Hematological: Negative for adenopathy. Bruises/bleeds easily.  Psychiatric/Behavioral: Negative for dysphoric mood and sleep disturbance (sleeping on 2 pillows). The patient is not nervous/anxious.    Vitals:   05/11/20 1411  BP: 118/88  Pulse: 80  Resp: 18  SpO2: 97%  Weight: 223 lb (101.2 kg)  Height: 5' 7"  (1.702 m)   Wt Readings from Last 3 Encounters:  05/11/20 223 lb (101.2 kg)  04/17/20 210 lb (95.3 kg)  04/13/20 205 lb (93 kg)   Lab Results  Component Value Date   CREATININE 1.03 04/25/2020   CREATININE 1.35 (H) 04/21/2020   CREATININE 1.26 (H) 04/19/2020    Physical Exam Vitals and nursing note reviewed.  Constitutional:      Appearance: He is well-developed.  HENT:     Head: Normocephalic and atraumatic.  Neck:     Vascular: No JVD.  Cardiovascular:     Rate and Rhythm: Normal rate and regular rhythm.  Pulmonary:     Effort: Pulmonary effort is normal.     Breath sounds: Examination of the right-upper field reveals wheezing. Examination of the right-lower field reveals wheezing and rales. Examination of the left-lower field reveals wheezing and rales. Wheezing and rales present. No rhonchi.  Abdominal:     General: There is distension.     Palpations: Abdomen is soft.     Tenderness: There is no abdominal tenderness.  Musculoskeletal:        General: Deformity (right leg prosthesis present) present. No tenderness.     Cervical back: Normal range of motion and neck supple.     Left lower leg: No tenderness. Edema (2+ pitting) present.  Skin:    General: Skin is warm and dry.     Findings: No erythema.  Neurological:     Mental Status: He is alert and oriented to person, place, and  time.  Psychiatric:        Behavior: Behavior normal.        Thought Content: Thought content normal.     Assessment & Plan:  1: Acute on Chronic heart failure with reduced ejection fraction- - NYHA class III - moderately fluid overloaded today with continued pedal edema and weight gain - not weighing daily; instructed to resume and to call for an overnight weight gain of >2 pounds or a weekly weight gain of >5 pounds - weight up 13 pounds from last visit 3 weeks ago - verquvo 2.22m started 04/26/20; will increase to 531mdaily (2 week samples provided) - will send for 8022mV lasix/ 90m49mO potassium and this was scheduled for tomorrow - will get BMP/BNP tomorrow as well - saw cardiology ([facility] Situ/26/21 & returns 05/16/20 - BNP 04/25/20 was 1719.4 - participating in paramedicine program - not interested in COVID vaccine at this time - extensive amount of time discussing palliative care referral to establish goals of care as well as assist with advance directives/HCPOA decisions as patient currently has significant other but is not married; explained that this wasn't hospice but trying to help him manage his symptoms, coordinate care etc; patient is somewhat interested but would like to think about this and discuss with his significant other that is present with him - explained that he could have cardiology send in referral or that I  could send in at his next appointment or at any point  2: HTN- - BP looks good today - saw PCP Dr. 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 Each medication was verbally reviewed with the patient and he was encouraged to bring the bottles to every visit to confirm accuracy of list.   Since seeing cardiology next week, will see patient back in a few weeks.

## 2020-05-11 ENCOUNTER — Other Ambulatory Visit: Payer: Self-pay | Admitting: Family

## 2020-05-11 ENCOUNTER — Encounter (INDEPENDENT_AMBULATORY_CARE_PROVIDER_SITE_OTHER): Payer: Self-pay | Admitting: Nurse Practitioner

## 2020-05-11 ENCOUNTER — Ambulatory Visit: Payer: Medicare Other | Attending: Family | Admitting: Family

## 2020-05-11 ENCOUNTER — Encounter: Payer: Self-pay | Admitting: Family

## 2020-05-11 VITALS — BP 118/88 | HR 80 | Resp 18 | Ht 67.0 in | Wt 223.0 lb

## 2020-05-11 DIAGNOSIS — Z7901 Long term (current) use of anticoagulants: Secondary | ICD-10-CM | POA: Insufficient documentation

## 2020-05-11 DIAGNOSIS — G8929 Other chronic pain: Secondary | ICD-10-CM | POA: Insufficient documentation

## 2020-05-11 DIAGNOSIS — N400 Enlarged prostate without lower urinary tract symptoms: Secondary | ICD-10-CM | POA: Diagnosis not present

## 2020-05-11 DIAGNOSIS — Z791 Long term (current) use of non-steroidal anti-inflammatories (NSAID): Secondary | ICD-10-CM | POA: Diagnosis not present

## 2020-05-11 DIAGNOSIS — E1151 Type 2 diabetes mellitus with diabetic peripheral angiopathy without gangrene: Secondary | ICD-10-CM | POA: Diagnosis not present

## 2020-05-11 DIAGNOSIS — Z955 Presence of coronary angioplasty implant and graft: Secondary | ICD-10-CM | POA: Diagnosis not present

## 2020-05-11 DIAGNOSIS — I70299 Other atherosclerosis of native arteries of extremities, unspecified extremity: Secondary | ICD-10-CM

## 2020-05-11 DIAGNOSIS — I5023 Acute on chronic systolic (congestive) heart failure: Secondary | ICD-10-CM

## 2020-05-11 DIAGNOSIS — F17210 Nicotine dependence, cigarettes, uncomplicated: Secondary | ICD-10-CM | POA: Diagnosis not present

## 2020-05-11 DIAGNOSIS — Z888 Allergy status to other drugs, medicaments and biological substances status: Secondary | ICD-10-CM | POA: Diagnosis not present

## 2020-05-11 DIAGNOSIS — L97909 Non-pressure chronic ulcer of unspecified part of unspecified lower leg with unspecified severity: Secondary | ICD-10-CM

## 2020-05-11 DIAGNOSIS — R519 Headache, unspecified: Secondary | ICD-10-CM | POA: Diagnosis not present

## 2020-05-11 DIAGNOSIS — I2510 Atherosclerotic heart disease of native coronary artery without angina pectoris: Secondary | ICD-10-CM | POA: Insufficient documentation

## 2020-05-11 DIAGNOSIS — Z720 Tobacco use: Secondary | ICD-10-CM

## 2020-05-11 DIAGNOSIS — G4733 Obstructive sleep apnea (adult) (pediatric): Secondary | ICD-10-CM | POA: Diagnosis not present

## 2020-05-11 DIAGNOSIS — J449 Chronic obstructive pulmonary disease, unspecified: Secondary | ICD-10-CM | POA: Insufficient documentation

## 2020-05-11 DIAGNOSIS — E871 Hypo-osmolality and hyponatremia: Secondary | ICD-10-CM

## 2020-05-11 DIAGNOSIS — R140 Abdominal distension (gaseous): Secondary | ICD-10-CM | POA: Insufficient documentation

## 2020-05-11 DIAGNOSIS — Z8249 Family history of ischemic heart disease and other diseases of the circulatory system: Secondary | ICD-10-CM | POA: Insufficient documentation

## 2020-05-11 DIAGNOSIS — Z79899 Other long term (current) drug therapy: Secondary | ICD-10-CM | POA: Insufficient documentation

## 2020-05-11 DIAGNOSIS — E1159 Type 2 diabetes mellitus with other circulatory complications: Secondary | ICD-10-CM

## 2020-05-11 DIAGNOSIS — R42 Dizziness and giddiness: Secondary | ICD-10-CM | POA: Insufficient documentation

## 2020-05-11 DIAGNOSIS — I110 Hypertensive heart disease with heart failure: Secondary | ICD-10-CM | POA: Diagnosis present

## 2020-05-11 DIAGNOSIS — Z89511 Acquired absence of right leg below knee: Secondary | ICD-10-CM | POA: Diagnosis not present

## 2020-05-11 DIAGNOSIS — Z7984 Long term (current) use of oral hypoglycemic drugs: Secondary | ICD-10-CM | POA: Diagnosis not present

## 2020-05-11 DIAGNOSIS — E785 Hyperlipidemia, unspecified: Secondary | ICD-10-CM | POA: Insufficient documentation

## 2020-05-11 DIAGNOSIS — Z7982 Long term (current) use of aspirin: Secondary | ICD-10-CM | POA: Insufficient documentation

## 2020-05-11 DIAGNOSIS — I10 Essential (primary) hypertension: Secondary | ICD-10-CM

## 2020-05-11 NOTE — Patient Instructions (Signed)
Continue weighing daily and call for an overnight weight gain of > 2 pounds or a weekly weight gain of >5 pounds. 

## 2020-05-12 ENCOUNTER — Ambulatory Visit
Admission: RE | Admit: 2020-05-12 | Discharge: 2020-05-12 | Disposition: A | Discharge status: Home / Self Care | Payer: Medicare Other | Source: Ambulatory Visit | Attending: Family | Admitting: Family

## 2020-05-12 ENCOUNTER — Other Ambulatory Visit: Payer: Self-pay

## 2020-05-12 DIAGNOSIS — I5023 Acute on chronic systolic (congestive) heart failure: Secondary | ICD-10-CM | POA: Insufficient documentation

## 2020-05-12 LAB — BRAIN NATRIURETIC PEPTIDE: B Natriuretic Peptide: 2534.9 pg/mL — ABNORMAL HIGH (ref 0.0–100.0)

## 2020-05-12 LAB — BASIC METABOLIC PANEL
Anion gap: 10 (ref 5–15)
BUN: 12 mg/dL (ref 6–20)
CO2: 26 mmol/L (ref 22–32)
Calcium: 8.8 mg/dL — ABNORMAL LOW (ref 8.9–10.3)
Chloride: 96 mmol/L — ABNORMAL LOW (ref 98–111)
Creatinine, Ser: 0.99 mg/dL (ref 0.61–1.24)
GFR, Estimated: >60 mL/min (ref >60)
Glucose, Bld: 106 mg/dL — ABNORMAL HIGH (ref 70–99)
Potassium: 4 mmol/L (ref 3.5–5.1)
Sodium: 132 mmol/L — ABNORMAL LOW (ref 135–145)

## 2020-05-12 MED ORDER — FUROSEMIDE 10 MG/ML IJ SOLN: 80.0000 mg | Freq: Once | INTRAMUSCULAR | Status: AC

## 2020-05-12 MED ORDER — POTASSIUM CHLORIDE CRYS ER 20 MEQ PO TBCR
EXTENDED_RELEASE_TABLET | ORAL | Status: AC
  Administered 2020-05-12: 40 meq via ORAL
  Filled 2020-05-12: qty 2

## 2020-05-12 MED ORDER — FUROSEMIDE 10 MG/ML IJ SOLN
INTRAMUSCULAR | Status: AC
  Administered 2020-05-12: 80 mg via INTRAVENOUS
  Filled 2020-05-12: qty 8

## 2020-05-12 MED ORDER — POTASSIUM CHLORIDE CRYS ER 20 MEQ PO TBCR: 40.0000 meq | EXTENDED_RELEASE_TABLET | Freq: Once | ORAL | Status: AC

## 2020-05-15 ENCOUNTER — Telehealth (INDEPENDENT_AMBULATORY_CARE_PROVIDER_SITE_OTHER): Payer: Self-pay

## 2020-05-15 ENCOUNTER — Ambulatory Visit
Admission: RE | Admit: 2020-05-15 | Discharge: 2020-05-15 | Disposition: A | Discharge status: Home / Self Care | Payer: Medicare Other | Source: Ambulatory Visit | Attending: Family | Admitting: Family

## 2020-05-15 ENCOUNTER — Other Ambulatory Visit: Payer: Self-pay

## 2020-05-15 ENCOUNTER — Other Ambulatory Visit: Payer: Self-pay | Admitting: Family

## 2020-05-15 DIAGNOSIS — I5023 Acute on chronic systolic (congestive) heart failure: Secondary | ICD-10-CM | POA: Diagnosis not present

## 2020-05-15 DIAGNOSIS — Z01812 Encounter for preprocedural laboratory examination: Secondary | ICD-10-CM | POA: Insufficient documentation

## 2020-05-15 LAB — BRAIN NATRIURETIC PEPTIDE: B Natriuretic Peptide: 2042.1 pg/mL — ABNORMAL HIGH (ref 0.0–100.0)

## 2020-05-15 LAB — BASIC METABOLIC PANEL
Anion gap: 11 (ref 5–15)
BUN: 14 mg/dL (ref 6–20)
CO2: 28 mmol/L (ref 22–32)
Calcium: 9 mg/dL (ref 8.9–10.3)
Chloride: 93 mmol/L — ABNORMAL LOW (ref 98–111)
Creatinine, Ser: 1.15 mg/dL (ref 0.61–1.24)
GFR, Estimated: >60 mL/min (ref >60)
Glucose, Bld: 112 mg/dL — ABNORMAL HIGH (ref 70–99)
Potassium: 4.6 mmol/L (ref 3.5–5.1)
Sodium: 132 mmol/L — ABNORMAL LOW (ref 135–145)

## 2020-05-15 MED ORDER — SODIUM CHLORIDE FLUSH 0.9 % IV SOLN
INTRAVENOUS | Status: AC
  Filled 2020-05-15: qty 10

## 2020-05-15 MED ORDER — FUROSEMIDE 10 MG/ML IJ SOLN
INTRAMUSCULAR | Status: AC
  Administered 2020-05-15: 80 mg via INTRAVENOUS
  Filled 2020-05-15: qty 8

## 2020-05-15 MED ORDER — POTASSIUM CHLORIDE CRYS ER 20 MEQ PO TBCR
EXTENDED_RELEASE_TABLET | ORAL | Status: AC
  Administered 2020-05-15: 40 meq via ORAL
  Filled 2020-05-15: qty 2

## 2020-05-15 MED ORDER — POTASSIUM CHLORIDE CRYS ER 20 MEQ PO TBCR: 40.0000 meq | EXTENDED_RELEASE_TABLET | Freq: Once | ORAL | Status: AC

## 2020-05-15 MED ORDER — FUROSEMIDE 10 MG/ML IJ SOLN: 80.0000 mg | Freq: Once | INTRAMUSCULAR | Status: AC

## 2020-05-15 NOTE — Telephone Encounter (Signed)
Patient left a voicemail stating that he spoke Ronalee Belts with [employer] about getting a new rx for his prothesis. I spoke with Zigmund Daniel with [employer] and she needed the face to face note when the patient came in the office for his prothesis. The 04/13/20 note has been faxed over to [facility] and patient has been made aware.

## 2020-05-15 NOTE — Progress Notes (Signed)
Date:  05/16/2020   ID:  [name] [name], DOB September 02, 1960, MRN [id number]  Patient Location:  [place] [id number]   Provider location:   [facility], [place] office  PCP:  [name], MD  Cardiologist:  [facility]   Chief Complaint  Patient presents with  . office visit    1 month F/U-Patient reports increased SOB and swelling in abdomen; Meds verbally reviewed with patient.    History of Present Illness:    [name] [name] is a 59 y.o. 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 and lower extremity ulceration,  PAD s/p prior SFA stenting 03/2013 s/p R BKA,  carotid arterial disease History of leaving the hospital AMA Who presents for follow-up of his severe acute on chronic heart failure, coronary disease stable angina PAD  Presents today with a 32 ounce soda in his lap from local fast food restaurant  Frequent trips to same day surgery for IV Lasix Weight is essentially unchanged Reports frustration that he is not improving Taking torsemide 40 in the morning, no other torsemide dosing Lab work reviewed with him, stable sodium level 132, takes 1 g sodium tab daily History of hyponatremia on diuretics without sodium tab  Continues to smoke over 1 pack/day  Prior history of leaving AMA from the hospital  EKG personally reviewed by myself on todays visit Shows atrial fibrillation rate 112 bpm right bundle branch block  Prior history reviewed with him  hospital 01/2020, NSTEMI, atrial fib Markedly elevated troponin Hypotensive,  echocardiogram confirming ejection fraction less than 20% with global hypokinesis, Severe PAD Cardiac catheterization showing  severe three-vessel disease, patent proximal LAD stent, moderate distal LAD disease Significant stenosis proximal left circumflex, bifurcation lesion, heavily calcified, RCA stent patent, proximal RCA disease It was felt his cardiomyopathy is out of proportion to his coronary disease Management of his atrial fibrillation and heart failure symptoms felt to be more of a priority It was recommended he have pressors and diuresis Before this could be initiated, he left the hospital [facility] January 20, 2020  Admitted to [facility] 01/29/2020 Treated for CHF, left AMA August 25  Presentation to the hospital May 2020 acute on chronic systolic CHF Treated with Lasix IV, transition to torsemide Toprol XL and spironolactone   with continued -ACEi has previously been held secondary to relative hypotension  LE stent placed 08/2018 Percutaneous transluminal angioplasty of the entire left SFA and above-knee popliteal artery with a 5 mm diameter by 30 cm length and a 5 mm diameter by 22 cm length Lutonix drug-coated angioplasty balloon  Prior CV studies:   The following studies were reviewed today:  2D Echo 11/06/2018: 1. The left ventricle has severely reduced systolic function, with an ejection fraction of 20-25%. The cavity size was moderately dilated. Indeterminate diastolic filling due to E-A fusion. Left ventricular diffuse hypokinesis. 2. The right ventricle has severely reduced systolic function. The cavity was mildly enlarged. There is no increase in right ventricular wall thickness. Right ventricular systolic pressure is moderately elevated with an estimated pressure of 59.6 mmHg.   Past Medical History:  Diagnosis Date  . Arthritis   . Asthma   .  Atherosclerosis   . BPH (benign prostatic hyperplasia)   . Carotid arterial disease ([place])   . Charcot's joint of foot, right   . Chronic combined systolic (congestive) and diastolic (congestive) heart failure ([place])    a. 02/2013 EF 50% by LV gram; b. 04/2017  Echo: EF 25-30%. diff HK. Gr2 DD. Mod dil LA/RV. PASP 22mHg.  .Marland KitchenCOPD (chronic obstructive pulmonary disease) ([place]   . Coronary artery disease    a. 2013 S/P PCI of LAD ([facility];  b. 03/2013 PCI: RCA 90p ( 2.5 x 23 mm DES); c. 02/2014 Cath: patent RCA stent->Med Rx; d. 04/2017 Cath: LM nl, LAd 30ost/p, 661m, D1 80ost, LCX 90p/m, OM1 85, RCA 70/20p, 8056m0d->Referred for CT Surg-felt to be poor candidate.  . Diabetic neuropathy (HCCPal[place] . Diabetic ulcer of right foot (HCCElkhart . Hernia   . Hyperlipidemia   . Hypertension   . Ischemic cardiomyopathy    a. 04/2017 Echo: EF 25-30%; b. 08/2017 Cardiac MRI ([facility]): EF 19%, sev glob HK. RVEF 20%, mod BAE, triv MR, mild-mod TR. Basal lateral subendocardial infarct (viable), inf/infsept, dist septal ischemia, basal to mid lat peri-infarct ischemia.  . Morbid obesity (HCCWolf Lake . Neuropathy   . PAD (peripheral artery disease) ([place]CC[place]  a. Followed by Dr. DewLucky Cowboy. 08/2010 Periph Angio: RSFA 70-80p (6X60 self-expanding stent); c. 09/2010 Periph Angio: L SFA 100p (7x unknown length self-expanding stent); d. 06/2015 Periph Angio: R SFA short segment occlusion (6x12 self-expanding stent).  . Restless leg syndrome   . Sleep apnea   . Subclavian artery stenosis, right (HCCSand[place] . Syncope and collapse   . Tobacco use    a. 75+ yr hx - still smoking 1ppd, down from 2 ppd.  . Type II diabetes mellitus (HCCM[place] . Varicose vein    Past Surgical History:  Procedure Laterality Date  . ABDOMINAL AORTAGRAM N/A 06/23/2013   Procedure: ABDOMINAL AORMaxcine HamSurgeon: MuhWellington HampshireD;  [facility];  Service: Cardiovascular;  Laterality: N/A;  . AMPUTATION Right 04/01/2018   Procedure: AMPUTATION BELOW KNEE;  Surgeon: DewAlgernon HuxleyD;  Location: [facility];  Service: Vascular;  Laterality: Right;  . APPENDECTOMY    . CARDIAC CATHETERIZATION  10/14   [facility]; X1 STENT PROXIMAL RCA  . CARDIAC CATHETERIZATION  02/2010   [facility]  . CARDIAC [facility]ATION  [facility]   . CLAVICLE SURGERY    . HERNIA REPAIR    . IRRIGATION AND DEBRIDEMENT FOOT Right 01/04/2018   Procedure: IRRIGATION AND DEBRIDEMENT FOOT;  Surgeon: FowSamara DeistPM;  Location: [facility] OR[facility];  Service: Podiatry;  Laterality: Right;  . LEFT HEART CATH AND CORONARY ANGIOGRAPHY N/A 01/20/2020   Procedure: LEFT HEART CATH AND CORONARY ANGIOGRAPHY;  Surgeon: AriWellington HampshireD;  [facility];  Service: Cardiovascular;  Laterality: N/A;  . LEFT HEART CATH AND CORS/GRAFTS ANGIOGRAPHY N/A 04/28/2017   Procedure: LEFT HEART CATH AND CORONARY ANGIOGRAPHY;  Surgeon: AriWellington HampshireD;  [facility];  Service: Cardiovascular;  Laterality: N/A;  . LOWER EXTREMITY ANGIOGRAPHY Right 03/03/2018   Procedure: LOWER EXTREMITY ANGIOGRAPHY;  Surgeon: SchKatha CabalD;  [facility];  Service: Cardiovascular;  Laterality: Right;  . LOWER EXTREMITY ANGIOGRAPHY Left 08/24/2018   Procedure: LOWER EXTREMITY ANGIOGRAPHY;  Surgeon: DewAlgernon HuxleyD;  [facility];  Service: Cardiovascular;  Laterality: Left;  . LOWER EXTREMITY ANGIOGRAPHY  Left 06/14/2019   Procedure: LOWER EXTREMITY ANGIOGRAPHY;  Surgeon: Algernon Huxley, MD;  Location: [place] [facility] LAB;  Service: Cardiovascular;  Laterality: Left;  . LOWER EXTREMITY ANGIOGRAPHY Left 08/02/2019   Procedure: LOWER EXTREMITY ANGIOGRAPHY;  Surgeon: Algernon Huxley, MD;  Location: [place] [facility];  Service: Cardiovascular;  Laterality: Left;  . PERIPHERAL ARTERIAL STENT GRAFT     x2 left/right  . PERIPHERAL VASCULAR BALLOON ANGIOPLASTY Right 01/06/2018   Procedure: PERIPHERAL VASCULAR BALLOON ANGIOPLASTY;  Surgeon: Katha Cabal, MD;  Location: [place] [facility] LAB;  Service: Cardiovascular;  Laterality: Right;  . PERIPHERAL VASCULAR CATHETERIZATION N/A 07/06/2015   Procedure: Abdominal Aortogram w/Lower Extremity;  Surgeon: Algernon Huxley, MD;  Location: [place] [facility] LAB;  Service:  Cardiovascular;  Laterality: N/A;  . PERIPHERAL VASCULAR CATHETERIZATION  07/06/2015   Procedure: Lower Extremity Intervention;  Surgeon: Algernon Huxley, MD;  Location: [place] [facility] LAB;  Service: Cardiovascular;;     Current Meds  Medication Sig  . albuterol (VENTOLIN HFA) 108 (90 Base) MCG/ACT inhaler Inhale into the lungs every 6 (six) hours as needed for wheezing or shortness of breath.  [name] amiodarone (PACERONE) 200 MG tablet Take 200 mg by mouth daily.  [name] aspirin EC 81 MG tablet Take 1 tablet (81 mg total) by mouth daily.  [name] atorvastatin (LIPITOR) 80 MG tablet Take 80 mg by mouth daily.  . budesonide-formoterol (SYMBICORT) 80-4.5 MCG/ACT inhaler Inhale 2 puffs into the lungs as needed.  . clopidogrel (PLAVIX) 75 MG tablet Take 75 mg by mouth daily.   . collagenase (SANTYL) ointment Apply 1 application topically daily.  . DULoxetine (CYMBALTA) 20 MG capsule Take 30 mg by mouth daily.   [name] ELIQUIS 5 MG TABS tablet Take 5 mg by mouth daily.   . empagliflozin (JARDIANCE) 10 MG TABS tablet Take 10 mg by mouth daily.   [name] ezetimibe (ZETIA) 10 MG tablet Take 10 mg by mouth daily.   [name] gabapentin (NEURONTIN) 800 MG tablet Take 800 mg by mouth 4 (four) times daily.   . Ipratropium-Albuterol (COMBIVENT RESPIMAT) 20-100 MCG/ACT AERS respimat Inhale 1 puff into the lungs every 6 (six) hours as needed for wheezing.  [name] ipratropium-albuterol (DUONEB) 0.5-2.5 (3) MG/3ML SOLN Inhale 3 mLs into the lungs every 6 (six) hours as needed (wheezing/sob).   . isosorbide mononitrate (IMDUR) 30 MG 24 hr tablet Take 30 mg by mouth daily.  . magnesium oxide (MAG-OX) 400 MG tablet Take 400 mg by mouth.   . metFORMIN (GLUCOPHAGE-XR) 500 MG 24 hr tablet Take 1,000 mg by mouth 2 (two) times daily.   . metoprolol succinate (TOPROL-XL) 25 MG 24 hr tablet Take 1 tablet (25 mg total) by mouth daily.  . mometasone-formoterol (DULERA) 200-5 MCG/ACT AERO Inhale 2 puffs into the lungs as needed.   . Multiple Vitamin  (MULTI-VITAMINS) TABS Take 1 tablet by mouth daily.   . mupirocin ointment (BACTROBAN) 2 % SMARTSIG:1 Application Topical 2-3 Times Daily  . naloxone (NARCAN) 4 MG/0.1ML LIQD nasal spray kit Place into the nose as directed.   . nitroGLYCERIN (NITROSTAT) 0.4 MG SL tablet Place 1 tablet (0.4 mg total) under the tongue every 5 (five) minutes as needed for chest pain.  [name] nystatin (MYCOSTATIN) 100000 UNIT/ML suspension Take 5 mLs by mouth 4 (four) times daily.  [name] omeprazole (PRILOSEC) 40 MG capsule Take 40 mg by mouth daily.  . Oxycodone HCl 10 MG TABS Take 10 mg by mouth every 6 (six) hours as needed (pain).   [name]  pantoprazole (PROTONIX) 40 MG tablet Take 40 mg by mouth daily.  . potassium chloride (KLOR-CON) 10 MEQ tablet Take 20 mEq by mouth daily.  . promethazine (PHENERGAN) 25 MG tablet Take 25 mg by mouth every 6 (six) hours as needed.   . sodium chloride 1 g tablet Take 1 tablet (1 g total) by mouth daily.  . SYMBICORT 80-4.5 MCG/ACT inhaler Inhale 2 puffs into the lungs daily.   [name] torsemide (DEMADEX) 20 MG tablet Take 40 mg by mouth daily.  . traZODone (DESYREL) 150 MG tablet Take 150 mg by mouth at bedtime.  [name] umeclidinium bromide (INCRUSE ELLIPTA) 62.5 MCG/INH AEPB Inhale 1 puff into the lungs daily.   . Vericiguat 5 MG TABS Take 5 mg by mouth daily.     Allergies:   Dulaglutide, Other, and Prednisone   Social History   Tobacco Use  . Smoking status: Current Every Day Smoker    Packs/day: 1.00    Years: 41.00    Pack years: 41.00    Types: Cigarettes  . Smokeless tobacco: Never Used  Vaping Use  . Vaping Use: Never used  Substance Use Topics  . Alcohol use: No  . Drug use: No     Family Hx: The patient's family history includes Heart attack in his father; Heart disease in his father; Hyperlipidemia in his father and mother; Hypertension in his father and mother.  ROS:   Please see the history of present illness.    Review of Systems  Constitutional: Negative.   HENT:  Negative.   Respiratory: Positive for shortness of breath.   Cardiovascular: Positive for leg swelling.  Gastrointestinal: Negative.        Abdominal distention  Musculoskeletal: Negative.   Neurological: Negative.   Psychiatric/Behavioral: Negative.   All other systems reviewed and are negative.    Labs/Other Tests and Data Reviewed:    Recent Labs: 09/17/2019: ALT 10 09/28/2019: Magnesium 1.7 03/26/2020: Hemoglobin 13.3; Platelets 177 05/15/2020: B Natriuretic Peptide 2,042.1; BUN 14; Creatinine, Ser 1.15; Potassium 4.6; Sodium 132   Recent Lipid Panel Lab Results  Component Value Date/Time   CHOL 118 01/20/2020 03:52 AM   CHOL 211 (H) 04/05/2013 05:20 AM   TRIG 37 01/20/2020 03:52 AM   TRIG 321 (H) 04/05/2013 05:20 AM   HDL 34 (L) 01/20/2020 03:52 AM   HDL 34 (L) 04/05/2013 05:20 AM   CHOLHDL 3.5 01/20/2020 03:52 AM   LDLCALC 77 01/20/2020 03:52 AM   LDLCALC 113 (H) 04/05/2013 05:20 AM    Wt Readings from Last 3 Encounters:  05/16/20 215 lb (97.5 kg)  05/11/20 223 lb (101.2 kg)  04/17/20 210 lb (95.3 kg)     Exam:    Vital Signs: Vital signs may also be detailed in the HPI BP 110/80 (BP Location: Right Arm, Patient Position: Sitting, Cuff Size: Normal)   Pulse (!) 112   Ht 5' 7"  (1.702 m)   Wt 215 lb (97.5 kg)   SpO2 97%   BMI 33.67 kg/m  Presents in a wheelchair  constitutional:  oriented to person, place, and time. No distress.  HENT:  Head: Grossly normal Eyes:  no discharge. No scleral icterus.  Neck: JVD 10+, no carotid bruits  Cardiovascular: Irregularly irregular, no murmurs appreciated 2+ pitting edema left lower extremity and right thigh, Flank edema, abdominal distention Pulmonary/Chest: Rales, dullness at the bases Abdominal: Soft.  no distension.  no tenderness.  Musculoskeletal: Normal range of motion Neurological:  normal muscle tone. Coordination normal. No atrophy  Skin: Skin warm and dry Psychiatric: normal affect, pleasant   ASSESSMENT  & PLAN:     Chronic systolic HF (heart failure) (HCC) - Only taking torsemide 40 daily, recommend he increase this up to 40 twice daily We will give IV Lasix today Presenting with large soda in his lap, strongly recommended fluid restriction Follow-up several weeks time Will likely need additional IV Lasix to 3 times a week to get him out of the hospital Given end-stage disease, likely should be on hospice Terminal cardiac issue discussed with him  Coronary artery disease of native artery of native heart with stable angina pectoris ([place]) - Previously declined CABG Prior catheterization, left AMA  Was at [facility], also left AMA No further ischemic work-up at this time given poor compliance  Type 2 diabetes mellitus with foot ulcer, with long-term current use of insulin ([place])  Followed by vascular, has had amputation on the right High risk of further disease given continued smoking Again recommend smoking cessation  CODE STATUS Discussed with him, family with him on today's visit (wife?) Recommend they discuss this in detail, if placed on the ventilator I did detail that he would likely not survive and come off the ventilator  Persistent atrial fibrillation We will stop the amiodarone Appears this is permanent, also question of med compliance on his Eliquis Continue Eliquis.  Reports he does not take all 2 pages of his medications  Atherosclerosis of artery of extremity with ulceration ([place]) -  Followed by vascular, prior stent left SFA Amputation on the right Still smoking, cessation recommended Stable  PAD (peripheral artery disease) ([place]) -  smoking cessation discussed  Mixed hyperlipidemia -  Last cholesterol at goal   Total encounter time more than 45 minutes  Greater than 50% was spent in counseling and coordination of care with the patient   Signed, Ida Rogue, MD  05/16/2020 3:06 PM    [place] Office [address]  [address], [place],  [address]

## 2020-05-16 ENCOUNTER — Encounter: Payer: Self-pay | Admitting: Cardiovascular Disease

## 2020-05-16 ENCOUNTER — Ambulatory Visit (INDEPENDENT_AMBULATORY_CARE_PROVIDER_SITE_OTHER): Payer: Medicare Other | Admitting: Cardiovascular Disease

## 2020-05-16 VITALS — BP 110/80 | HR 112 | Ht 67.0 in | Wt 215.0 lb

## 2020-05-16 DIAGNOSIS — Z720 Tobacco use: Secondary | ICD-10-CM | POA: Diagnosis not present

## 2020-05-16 DIAGNOSIS — E1159 Type 2 diabetes mellitus with other circulatory complications: Secondary | ICD-10-CM | POA: Diagnosis not present

## 2020-05-16 DIAGNOSIS — Z794 Long term (current) use of insulin: Secondary | ICD-10-CM

## 2020-05-16 DIAGNOSIS — I25118 Atherosclerotic heart disease of native coronary artery with other forms of angina pectoris: Secondary | ICD-10-CM

## 2020-05-16 DIAGNOSIS — L97909 Non-pressure chronic ulcer of unspecified part of unspecified lower leg with unspecified severity: Secondary | ICD-10-CM

## 2020-05-16 DIAGNOSIS — I70299 Other atherosclerosis of native arteries of extremities, unspecified extremity: Secondary | ICD-10-CM

## 2020-05-16 DIAGNOSIS — I4811 Longstanding persistent atrial fibrillation: Secondary | ICD-10-CM | POA: Diagnosis not present

## 2020-05-16 DIAGNOSIS — I5022 Chronic systolic (congestive) heart failure: Secondary | ICD-10-CM | POA: Diagnosis not present

## 2020-05-16 DIAGNOSIS — I10 Essential (primary) hypertension: Secondary | ICD-10-CM

## 2020-05-16 DIAGNOSIS — I739 Peripheral vascular disease, unspecified: Secondary | ICD-10-CM

## 2020-05-16 MED ORDER — TORSEMIDE 20 MG PO TABS
40.0000 mg | ORAL_TABLET | Freq: Two times a day (BID) | ORAL | 4 refills | Status: DC
Start: 2020-05-16 — End: 2020-07-01

## 2020-05-16 MED ORDER — METOPROLOL SUCCINATE ER 25 MG PO TB24
25.0000 mg | ORAL_TABLET | Freq: Every day | ORAL | 3 refills | Status: DC
Start: 2020-05-16 — End: 2020-07-01

## 2020-05-16 MED ORDER — FUROSEMIDE 10 MG/ML IJ SOLN
80.0000 mg | Freq: Once | INTRAMUSCULAR | Status: AC
  Administered 2020-05-16: 80 mg via INTRAVENOUS

## 2020-05-16 NOTE — Patient Instructions (Addendum)
Medication Instructions:  Torsemide up to 40mg  twice a day (take in the morning and after lunch time) Sodium chloride 1 gm twice a day  Stop taking the amiodarone Stop taking the aspirin  You were administer Lasix 80mg  by IV today while in clinic  If you need a refill on your cardiac medications before your next appointment, please call your pharmacy.    Lab work: No new labs needed   If you have labs (blood work) drawn today and your tests are completely normal, you will receive your results only by: [name] MyChart Message (if you have MyChart) OR . A paper copy in the mail If you have any lab test that is abnormal or we need to change your treatment, we will call you to review the results.   Testing/Procedures: No new testing needed   Follow-Up: At [facility], you and your health needs are our priority.  As part of our continuing mission to provide you with exceptional heart care, we have created designated Provider Care Teams.  These Care Teams include your primary Cardiologist (physician) and Advanced Practice Providers (APPs -  Physician Assistants and Nurse Practitioners) who all work together to provide you with the care you need, when you need it.  . You will need a follow up appointment in 1 month  . Providers on your designated Care Team:   . Murray Hodgkins, NP . Christell Faith, PA-C . Marrianne Mood, PA-C  Any Other Special Instructions Will Be Listed Below (If Applicable).  COVID-19 Vaccine Information can be found at: ShippingScam.co.uk For questions related to vaccine distribution or appointments, please email vaccine@[email or web address] .[email or web address] or call [phone].

## 2020-05-17 ENCOUNTER — Ambulatory Visit (INDEPENDENT_AMBULATORY_CARE_PROVIDER_SITE_OTHER): Payer: Medicare Other | Admitting: Nurse Practitioner

## 2020-05-17 ENCOUNTER — Other Ambulatory Visit: Payer: Self-pay

## 2020-05-17 ENCOUNTER — Other Ambulatory Visit: Payer: Self-pay | Admitting: Family

## 2020-05-17 ENCOUNTER — Ambulatory Visit
Admission: RE | Admit: 2020-05-17 | Discharge: 2020-05-17 | Disposition: A | Discharge status: Home / Self Care | Payer: Medicare Other | Source: Ambulatory Visit | Attending: Family | Admitting: Family

## 2020-05-17 ENCOUNTER — Telehealth: Payer: Self-pay

## 2020-05-17 ENCOUNTER — Telehealth: Payer: Self-pay | Admitting: Family

## 2020-05-17 ENCOUNTER — Encounter (INDEPENDENT_AMBULATORY_CARE_PROVIDER_SITE_OTHER): Payer: Self-pay | Admitting: Nurse Practitioner

## 2020-05-17 VITALS — BP 119/87 | HR 120 | Ht 67.0 in | Wt 216.0 lb

## 2020-05-17 DIAGNOSIS — I83029 Varicose veins of left lower extremity with ulcer of unspecified site: Secondary | ICD-10-CM | POA: Diagnosis not present

## 2020-05-17 DIAGNOSIS — I5022 Chronic systolic (congestive) heart failure: Secondary | ICD-10-CM

## 2020-05-17 DIAGNOSIS — I5020 Unspecified systolic (congestive) heart failure: Secondary | ICD-10-CM | POA: Diagnosis not present

## 2020-05-17 DIAGNOSIS — L97929 Non-pressure chronic ulcer of unspecified part of left lower leg with unspecified severity: Secondary | ICD-10-CM

## 2020-05-17 LAB — BASIC METABOLIC PANEL
Anion gap: 11 (ref 5–15)
BUN: 18 mg/dL (ref 6–20)
CO2: 27 mmol/L (ref 22–32)
Calcium: 9 mg/dL (ref 8.9–10.3)
Chloride: 90 mmol/L — ABNORMAL LOW (ref 98–111)
Creatinine, Ser: 1.18 mg/dL (ref 0.61–1.24)
GFR, Estimated: >60 mL/min (ref >60)
Glucose, Bld: 107 mg/dL — ABNORMAL HIGH (ref 70–99)
Potassium: 5.1 mmol/L (ref 3.5–5.1)
Sodium: 128 mmol/L — ABNORMAL LOW (ref 135–145)

## 2020-05-17 LAB — BRAIN NATRIURETIC PEPTIDE: B Natriuretic Peptide: 1745.6 pg/mL — ABNORMAL HIGH (ref 0.0–100.0)

## 2020-05-17 MED ORDER — POTASSIUM CHLORIDE CRYS ER 20 MEQ PO TBCR
EXTENDED_RELEASE_TABLET | ORAL | Status: AC
  Administered 2020-05-17: 40 meq via ORAL
  Filled 2020-05-17: qty 2

## 2020-05-17 MED ORDER — FUROSEMIDE 10 MG/ML IJ SOLN
INTRAMUSCULAR | Status: AC
  Administered 2020-05-17: 80 mg via INTRAVENOUS
  Filled 2020-05-17: qty 8

## 2020-05-17 MED ORDER — FUROSEMIDE 10 MG/ML IJ SOLN: 80.0000 mg | Freq: Once | INTRAMUSCULAR | Status: AC

## 2020-05-17 MED ORDER — POTASSIUM CHLORIDE CRYS ER 20 MEQ PO TBCR: 40.0000 meq | EXTENDED_RELEASE_TABLET | Freq: Once | ORAL | Status: AC

## 2020-05-17 NOTE — Telephone Encounter (Signed)
[name] [name], Licensed conveyancer with [facility] will not be able to assist pt with home care of administering IV lasix. Will try to reach out to anther agency to see if this can be cover.

## 2020-05-17 NOTE — Telephone Encounter (Signed)
Patient called to say that he needs IV lasix again. Received it 2 days ago and again, in cardiology office yesterday. There was discussion about doing a standing 3 days/ week of IV lasix to help with symptoms.   Have scheduled 80mg  IV Lasix/ 37meq PO potassium and BMP/ BNP for this afternoon as well as placed standing 3 days/ week order.   Discussed palliative care, again, with patient saying that this IV lasix isn't sustainable for months/ years and will be dependent on his lab results. Explained that palliative care has social worker etc to help manage medication issues, address goals of care etc.   Patient is agreeable to palliative care so referral has been placed.

## 2020-05-17 NOTE — Telephone Encounter (Signed)
-----   Message from Minna Merritts, MD sent at 05/17/2020 11:19 AM EST ----- This patient recently seen in clinic, requiring IV Lasix to 3 times a week CHF clinic and our office putting in orders for IV Lasix at same day surgery on regular basis Can we inquire whether agencies such as [facility] might be able to do home visits and give IV Lasix several times a week?: Thx TG

## 2020-05-17 NOTE — Telephone Encounter (Signed)
Per Dr. Rockey Situ request/note      "This patient recently seen in clinic, requiring IV Lasix to 3 times a week CHF clinic and our office putting in orders for IV Lasix at same day surgery on regular basis Can we inquire whether agencies such as [facility] might be able to do home visits and give IV Lasix several times a week?:"  This RN was able to get in touch with Colletta [place], Licensed conveyancer with [facility], she will have their manager review pt's chart. However, unlikely they will take on pt since this is not something they typically do. They usually go out once and teach pt how to administer medications themselves with an establish line, but this pt will need 3 visits a week and access each time.  Awaiting call back for confirmation, if no go, we will reach out to anther agency or come up with another plan, however pt may need to continue IV Lasix at [facility] or in clinic

## 2020-05-17 NOTE — Discharge Instructions (Signed)
Keep appointment on Friday at 1 PM.  Call your provider for shortness of breath or chest pain.

## 2020-05-18 ENCOUNTER — Telehealth: Payer: Self-pay | Admitting: Primary Care

## 2020-05-18 NOTE — Telephone Encounter (Signed)
Had to call patient back because he already had an appointment scheduled at [facility] @ 1 PM, I have changed the Palliative Consult from 1 PM to 3 PM.

## 2020-05-18 NOTE — Telephone Encounter (Signed)
Spoke with patient regarding Palliative referral/services and all questions were answered and he was in agreement with scheduling visit.  I have scheduled an In-person Consult for 05/29/20 @ 1 PM

## 2020-05-18 NOTE — Telephone Encounter (Signed)
Called and LVM with patient to notify him that his sodium level was low due to the frequent lasix treatment he has had this week and to take an extra sodium tablet for next couple of days to bring levels up per Darylene Price, [place] request.   Luetta Nutting, NT

## 2020-05-19 ENCOUNTER — Ambulatory Visit
Admission: RE | Admit: 2020-05-19 | Discharge: 2020-05-19 | Disposition: A | Discharge status: Home / Self Care | Payer: Medicare Other | Source: Ambulatory Visit | Attending: Family | Admitting: Family

## 2020-05-19 ENCOUNTER — Other Ambulatory Visit: Payer: Self-pay

## 2020-05-19 DIAGNOSIS — I5022 Chronic systolic (congestive) heart failure: Secondary | ICD-10-CM | POA: Diagnosis present

## 2020-05-19 LAB — BRAIN NATRIURETIC PEPTIDE: B Natriuretic Peptide: 1697.1 pg/mL — ABNORMAL HIGH (ref 0.0–100.0)

## 2020-05-19 LAB — BASIC METABOLIC PANEL
Anion gap: 11 (ref 5–15)
BUN: 21 mg/dL — ABNORMAL HIGH (ref 6–20)
CO2: 27 mmol/L (ref 22–32)
Calcium: 8.6 mg/dL — ABNORMAL LOW (ref 8.9–10.3)
Chloride: 93 mmol/L — ABNORMAL LOW (ref 98–111)
Creatinine, Ser: 1.39 mg/dL — ABNORMAL HIGH (ref 0.61–1.24)
GFR, Estimated: 59 mL/min — ABNORMAL LOW (ref >60)
Glucose, Bld: 121 mg/dL — ABNORMAL HIGH (ref 70–99)
Potassium: 4.6 mmol/L (ref 3.5–5.1)
Sodium: 131 mmol/L — ABNORMAL LOW (ref 135–145)

## 2020-05-19 MED ORDER — POTASSIUM CHLORIDE CRYS ER 20 MEQ PO TBCR: 40.0000 meq | EXTENDED_RELEASE_TABLET | ORAL | Status: DC

## 2020-05-19 MED ORDER — POTASSIUM CHLORIDE CRYS ER 20 MEQ PO TBCR
EXTENDED_RELEASE_TABLET | ORAL | Status: AC
  Administered 2020-05-19: 40 meq via ORAL
  Filled 2020-05-19: qty 2

## 2020-05-19 MED ORDER — FUROSEMIDE 10 MG/ML IJ SOLN
INTRAMUSCULAR | Status: AC
  Administered 2020-05-19: 80 mg via INTRAVENOUS
  Filled 2020-05-19: qty 8

## 2020-05-19 MED ORDER — FUROSEMIDE 10 MG/ML IJ SOLN: 80.0000 mg | INTRAMUSCULAR | Status: DC

## 2020-05-21 ENCOUNTER — Encounter (INDEPENDENT_AMBULATORY_CARE_PROVIDER_SITE_OTHER): Payer: Self-pay | Admitting: Nurse Practitioner

## 2020-05-21 NOTE — Progress Notes (Signed)
Subjective:    Patient ID: [name] [name], male    DOB: September 21, 1960, 59 y.o.   MRN: [id number] Chief Complaint  Patient presents with  . Follow-up    4wk unna boot check     Patient presents today for swelling and ulceration of his left lower extremity.  The patient has a history of heart failure and currently he is being diuresed with IV Lasix twice a week by his heart failure specialist.  The patient currently has right below-knee amputation but it has been difficult due to a change in the after a recent episode of diuresis.  He denies any fever, chills, nausea, vomiting or diarrhea.  The patient was previously on oral wraps however does not wish to return at this time.   Review of Systems  Cardiovascular: Positive for leg swelling.  Skin: Positive for wound.  All other systems reviewed and are negative.      Objective:   Physical Exam Vitals reviewed.  HENT:     Head: Normocephalic.  Cardiovascular:     Rate and Rhythm: Normal rate.     Pulses: Decreased pulses.     Heart sounds: Murmur heard.    Pulmonary:     Effort: Pulmonary effort is normal.  Musculoskeletal:     Right lower leg: 2+ Pitting Edema present.     Left lower leg: 2+ Pitting Edema present.     Right Lower Extremity: Right leg is amputated below knee.  Neurological:     Mental Status: He is alert and oriented to person, place, and time.     Motor: Weakness present.  Psychiatric:        Mood and Affect: Mood normal.        Behavior: Behavior normal.        Thought Content: Thought content normal.        Judgment: Judgment normal.     BP 119/87   Pulse (!) 120   Ht _0  (1.702 m)   Wt 216 lb (98 kg)   BMI 33.83 kg/m   Past Medical History:  Diagnosis Date  . Arthritis   . Asthma   . Atherosclerosis   . BPH (benign prostatic hyperplasia)   . Carotid arterial disease ([place])   . Charcot's joint of foot, right   . Chronic combined systolic (congestive) and diastolic (congestive) heart failure  ([place])    a. 02/2013 EF 50% by LV gram; b. 04/2017 Echo: EF 25-30%. diff HK. Gr2 DD. Mod dil LA/RV. PASP 71mHg.  .Marland KitchenCOPD (chronic obstructive pulmonary disease) ([place]   . Coronary artery disease    a. 2013 S/P PCI of LAD ([facility];  b. 03/2013 PCI: RCA 90p ( 2.5 x 23 mm DES); c. 02/2014 Cath: patent RCA stent->Med Rx; d. 04/2017 Cath: LM nl, LAd 30ost/p, 638m, D1 80ost, LCX 90p/m, OM1 85, RCA 70/20p, 8050m0d->Referred for CT Surg-felt to be poor candidate.  . Diabetic neuropathy ([place] . Diabetic ulcer of right foot ([facility] . Hernia   . Hyperlipidemia   . Hypertension   . Ischemic cardiomyopathy    a. 04/2017 Echo: EF 25-30%; b. 08/2017 Cardiac MRI ([facility]): EF 19%, sev glob HK. RVEF 20%, mod BAE, triv MR, mild-mod TR. Basal lateral subendocardial infarct (viable), inf/infsept, dist septal ischemia, basal to mid lat peri-infarct ischemia.  . Morbid obesity ([facility] . Neuropathy   . PAD (peripheral artery disease) ([place]  a. Followed by Dr. DewLucky Cowboy. 08/2010 Periph  Angio: RSFA 70-80p (6X60 self-expanding stent); c. 09/2010 Periph Angio: L SFA 100p (7x unknown length self-expanding stent); d. 06/2015 Periph Angio: R SFA short segment occlusion (6x12 self-expanding stent).  . Restless leg syndrome   . Sleep apnea   . Subclavian artery stenosis, right ([place])   . Syncope and collapse   . Tobacco use    a. 75+ yr hx - still smoking 1ppd, down from 2 ppd.  . Type II diabetes mellitus ([place])   . Varicose vein     Social History   Socioeconomic History  . Marital status: Single    Spouse name: Not on file  . Number of children: 0  . Years of education: 18  . Highest education level: 12th grade  Occupational History  . Occupation: on disability  Tobacco Use  . Smoking status: Current Every Day Smoker    Packs/day: 1.00    Years: 41.00    Pack years: 41.00    Types: Cigarettes  . Smokeless tobacco: Never Used  Vaping Use  . Vaping Use: Never used  Substance and Sexual Activity  . Alcohol use: No   . Drug use: No  . Sexual activity: Yes  Other Topics Concern  . Not on file  Social History Narrative   Lives at home in [place] with girlfriend. Independent at baseline.   Social Determinants of Health   Financial Resource Strain: Not on file  Food Insecurity: Not on file  Transportation Needs: Not on file  Physical Activity: Not on file  Stress: Not on file  Social Connections: Not on file  Intimate Partner Violence: Not on file    Past Surgical History:  Procedure Laterality Date  . ABDOMINAL AORTAGRAM N/A 06/23/2013   Procedure: ABDOMINAL Maxcine Ham;  Surgeon: Wellington Hampshire, MD;  Location: [place] [facility];  Service: Cardiovascular;  Laterality: N/A;  . AMPUTATION Right 04/01/2018   Procedure: AMPUTATION BELOW KNEE;  Surgeon: Algernon Huxley, MD;  Location: [facility] ORS;  Service: Vascular;  Laterality: Right;  . APPENDECTOMY    . CARDIAC CATHETERIZATION  10/14   [facility]; X1 STENT PROXIMAL RCA  . CARDIAC CATHETERIZATION  02/2010   [facility]  . CARDIAC CATHETERIZATION  02/21/2014   [facility]  . CLAVICLE SURGERY    . HERNIA REPAIR    . IRRIGATION AND DEBRIDEMENT FOOT Right 01/04/2018   Procedure: IRRIGATION AND DEBRIDEMENT FOOT;  Surgeon: Samara Deist, DPM;  Location: [facility] ORS;  Service: Podiatry;  Laterality: Right;  . LEFT HEART CATH AND CORONARY ANGIOGRAPHY N/A 01/20/2020   Procedure: LEFT HEART CATH AND CORONARY ANGIOGRAPHY;  Surgeon: Wellington Hampshire, MD;  Location: [place] CV [facility];  Service: Cardiovascular;  Laterality: N/A;  . LEFT HEART CATH AND CORS/GRAFTS ANGIOGRAPHY N/A 04/28/2017   Procedure: LEFT HEART CATH AND CORONARY ANGIOGRAPHY;  Surgeon: Wellington Hampshire, MD;  Location: [place] CV [facility];  Service: Cardiovascular;  Laterality: N/A;  . LOWER EXTREMITY ANGIOGRAPHY Right 03/03/2018   Procedure: LOWER EXTREMITY ANGIOGRAPHY;  Surgeon: Katha Cabal, MD;  Location: [place] CV [facility];  Service: Cardiovascular;  Laterality: Right;  . LOWER EXTREMITY ANGIOGRAPHY Left  08/24/2018   Procedure: LOWER EXTREMITY ANGIOGRAPHY;  Surgeon: Algernon Huxley, MD;  Location: [place] [facility];  Service: Cardiovascular;  Laterality: Left;  . LOWER EXTREMITY ANGIOGRAPHY Left 06/14/2019   Procedure: LOWER EXTREMITY ANGIOGRAPHY;  Surgeon: Algernon Huxley, MD;  Location: [place] CV [facility];  Service: Cardiovascular;  Laterality: Left;  . LOWER EXTREMITY ANGIOGRAPHY Left 08/02/2019  Procedure: LOWER EXTREMITY ANGIOGRAPHY;  Surgeon: Algernon Huxley, MD;  Location: [place] [facility];  Service: Cardiovascular;  Laterality: Left;  . PERIPHERAL ARTERIAL STENT GRAFT     x2 left/right  . PERIPHERAL VASCULAR BALLOON ANGIOPLASTY Right 01/06/2018   Procedure: PERIPHERAL VASCULAR BALLOON ANGIOPLASTY;  Surgeon: Katha Cabal, MD;  Location: [place] [facility];  Service: Cardiovascular;  Laterality: Right;  . PERIPHERAL VASCULAR CATHETERIZATION N/A 07/06/2015   Procedure: Abdominal Aortogram w/Lower Extremity;  Surgeon: Algernon Huxley, MD;  Location: [place] CV [facility];  Service: Cardiovascular;  Laterality: N/A;  . PERIPHERAL VASCULAR CATHETERIZATION  07/06/2015   Procedure: Lower Extremity Intervention;  Surgeon: Algernon Huxley, MD;  Location: [place] CV [facility];  Service: Cardiovascular;;    Family History  Problem Relation Age of Onset  . Heart attack Father   . Heart disease Father   . Hypertension Father   . Hyperlipidemia Father   . Hypertension Mother   . Hyperlipidemia Mother     Allergies  Allergen Reactions  . Dulaglutide Anaphylaxis, Diarrhea and Hives  . Other Itching    Skin itching associated with nitro patch  . Prednisone Rash    CBC Latest Ref Rng & Units 03/26/2020 01/20/2020 01/19/2020  WBC 4.0 - 10.5 K/uL 6.1 4.7 6.3  Hemoglobin 13.0 - 17.0 g/dL 13.3 10.6(L) 12.1(L)  Hematocrit 39.0 - 52.0 % 41.2 32.0(L) 36.5(L)  Platelets 150 - 400 K/uL 177 153 177      CMP     Component Value Date/Time   NA 131 (L) 05/19/2020 1338   NA 134 11/03/2019 1406   NA 136  06/08/2014 1037   K 4.6 05/19/2020 1338   K 4.3 06/08/2014 1037   CL 93 (L) 05/19/2020 1338   CL 101 06/08/2014 1037   CO2 27 05/19/2020 1338   CO2 27 06/08/2014 1037   GLUCOSE 121 (H) 05/19/2020 1338   GLUCOSE 415 (H) 06/08/2014 1037   BUN 21 (H) 05/19/2020 1338   BUN 25 (H) 11/03/2019 1406   BUN 9 06/08/2014 1037   CREATININE 1.39 (H) 05/19/2020 1338   CREATININE 0.87 02/05/2018 1417   CALCIUM 8.6 (L) 05/19/2020 1338   CALCIUM 9.2 06/08/2014 1037   PROT 6.5 09/17/2019 0056   PROT 6.1 01/29/2018 1602   PROT 7.3 03/07/2014 1955   ALBUMIN 3.4 (L) 09/17/2019 0056   ALBUMIN 3.3 (L) 01/29/2018 1602   ALBUMIN 3.9 03/07/2014 1955   AST 23 09/17/2019 0056   AST 17 03/07/2014 1955   ALT 10 09/17/2019 0056   ALT 23 03/07/2014 1955   ALKPHOS 93 09/17/2019 0056   ALKPHOS 105 03/07/2014 1955   BILITOT 1.2 09/17/2019 0056   BILITOT 0.6 01/29/2018 1602   BILITOT 0.6 03/07/2014 1955   GFRNONAA 59 (L) 05/19/2020 1338   GFRNONAA >60 06/08/2014 1037   GFRNONAA >60 02/17/2014 0909   GFRAA >60 01/20/2020 0352   GFRAA >60 06/08/2014 1037   GFRAA >60 02/17/2014 0909     No results found.     Assessment & Plan:   1. Venous ulcer of left leg ([place]) At this time the patient elected not to move forward with Unna wrap so we will stress the use of compression.  The patient is also instructed to utilize mupirocin ointment with bandages below his compression.  His compression should be worn daily.  Is hopeful that with diuresis the patient will have a great decrease in swelling.  The patient will follow up in January with noninvasive studies.  2. HFrEF (heart failure with reduced ejection fraction) ([place]) The patient notes that he is getting biweekly injections of the Lasix at his cardiologist office.  It is hopeful that with diuresis his swelling will decrease and allow his wound to heal.  Patient is advised to contact us if wound worsens or if the swelling increases despite diuresis as he may need  wraps.   Current Outpatient Medications on File Prior to Visit  Medication Sig Dispense Refill  . albuterol (VENTOLIN HFA) 108 (90 Base) MCG/ACT inhaler Inhale into the lungs every 6 (six) hours as needed for wheezing or shortness of breath.    [name] atorvastatin (LIPITOR) 80 MG tablet Take 80 mg by mouth daily.    . budesonide-formoterol (SYMBICORT) 80-4.5 MCG/ACT inhaler Inhale 2 puffs into the lungs as needed.    . clopidogrel (PLAVIX) 75 MG tablet Take 75 mg by mouth daily.     . collagenase (SANTYL) ointment Apply 1 application topically daily. 90 g 1  . DULoxetine (CYMBALTA) 20 MG capsule Take 30 mg by mouth daily.     [name] ELIQUIS 5 MG TABS tablet Take 5 mg by mouth daily.     . empagliflozin (JARDIANCE) 10 MG TABS tablet Take 10 mg by mouth daily.     [name] ezetimibe (ZETIA) 10 MG tablet Take 10 mg by mouth daily.     [name] gabapentin (NEURONTIN) 800 MG tablet Take 800 mg by mouth 4 (four) times daily.     . Ipratropium-Albuterol (COMBIVENT RESPIMAT) 20-100 MCG/ACT AERS respimat Inhale 1 puff into the lungs every 6 (six) hours as needed for wheezing.    [name] ipratropium-albuterol (DUONEB) 0.5-2.5 (3) MG/3ML SOLN Inhale 3 mLs into the lungs every 6 (six) hours as needed (wheezing/sob).     . isosorbide mononitrate (IMDUR) 30 MG 24 hr tablet Take 30 mg by mouth daily.    . magnesium oxide (MAG-OX) 400 MG tablet Take 400 mg by mouth.     . metFORMIN (GLUCOPHAGE-XR) 500 MG 24 hr tablet Take 1,000 mg by mouth 2 (two) times daily.     . metoprolol succinate (TOPROL-XL) 25 MG 24 hr tablet Take 1 tablet (25 mg total) by mouth daily. 90 tablet 3  . mometasone-formoterol (DULERA) 200-5 MCG/ACT AERO Inhale 2 puffs into the lungs as needed.     . Multiple Vitamin (MULTI-VITAMINS) TABS Take 1 tablet by mouth daily.     . mupirocin ointment (BACTROBAN) 2 % SMARTSIG:1 Application Topical 2-3 Times Daily    . naloxone (NARCAN) 4 MG/0.1ML LIQD nasal spray kit Place into the nose as directed.     . nitroGLYCERIN  (NITROSTAT) 0.4 MG SL tablet Place 1 tablet (0.4 mg total) under the tongue every 5 (five) minutes as needed for chest pain. 30 tablet 5  . nystatin (MYCOSTATIN) 100000 UNIT/ML suspension Take 5 mLs by mouth 4 (four) times daily.    [name] omeprazole (PRILOSEC) 40 MG capsule Take 40 mg by mouth daily.    . Oxycodone HCl 10 MG TABS Take 10 mg by mouth every 6 (six) hours as needed (pain).     . pantoprazole (PROTONIX) 40 MG tablet Take 40 mg by mouth daily.    . potassium chloride (KLOR-CON) 10 MEQ tablet Take 20 mEq by mouth daily.    . promethazine (PHENERGAN) 25 MG tablet Take 25 mg by mouth every 6 (six) hours as needed.     . sodium chloride 1 g tablet Take 1 tablet (1 g total) by mouth daily. [place]  tablet 3  . SYMBICORT 80-4.5 MCG/ACT inhaler Inhale 2 puffs into the lungs daily.     [name] torsemide (DEMADEX) 20 MG tablet Take 2 tablets (40 mg total) by mouth 2 (two) times daily. 120 tablet 4  . traZODone (DESYREL) 150 MG tablet Take 150 mg by mouth at bedtime.    [name] umeclidinium bromide (INCRUSE ELLIPTA) 62.5 MCG/INH AEPB Inhale 1 puff into the lungs daily.     . Vericiguat 5 MG TABS Take 5 mg by mouth daily.     No current facility-administered medications on file prior to visit.    There are no Patient Instructions on file for this visit. No follow-ups on file.   Kris Hartmann, NP

## 2020-05-22 ENCOUNTER — Ambulatory Visit
Admission: RE | Admit: 2020-05-22 | Discharge: 2020-05-22 | Disposition: A | Discharge status: Home / Self Care | Payer: Medicare Other | Source: Ambulatory Visit | Attending: Family | Admitting: Family

## 2020-05-22 ENCOUNTER — Ambulatory Visit (INDEPENDENT_AMBULATORY_CARE_PROVIDER_SITE_OTHER): Payer: Medicare Other | Admitting: Nurse Practitioner

## 2020-05-22 ENCOUNTER — Other Ambulatory Visit: Payer: Self-pay

## 2020-05-22 ENCOUNTER — Telehealth: Payer: Self-pay

## 2020-05-22 ENCOUNTER — Encounter (INDEPENDENT_AMBULATORY_CARE_PROVIDER_SITE_OTHER): Payer: Self-pay | Admitting: Nurse Practitioner

## 2020-05-22 VITALS — BP 113/77 | HR 87 | Resp 16

## 2020-05-22 DIAGNOSIS — I83029 Varicose veins of left lower extremity with ulcer of unspecified site: Secondary | ICD-10-CM | POA: Diagnosis not present

## 2020-05-22 DIAGNOSIS — I5022 Chronic systolic (congestive) heart failure: Secondary | ICD-10-CM | POA: Insufficient documentation

## 2020-05-22 DIAGNOSIS — I5020 Unspecified systolic (congestive) heart failure: Secondary | ICD-10-CM

## 2020-05-22 DIAGNOSIS — I7025 Atherosclerosis of native arteries of other extremities with ulceration: Secondary | ICD-10-CM | POA: Diagnosis not present

## 2020-05-22 DIAGNOSIS — L03116 Cellulitis of left lower limb: Secondary | ICD-10-CM | POA: Diagnosis not present

## 2020-05-22 DIAGNOSIS — L97929 Non-pressure chronic ulcer of unspecified part of left lower leg with unspecified severity: Secondary | ICD-10-CM

## 2020-05-22 LAB — BASIC METABOLIC PANEL
Anion gap: 11 (ref 5–15)
BUN: 22 mg/dL — ABNORMAL HIGH (ref 6–20)
CO2: 28 mmol/L (ref 22–32)
Calcium: 8.9 mg/dL (ref 8.9–10.3)
Chloride: 92 mmol/L — ABNORMAL LOW (ref 98–111)
Creatinine, Ser: 1.39 mg/dL — ABNORMAL HIGH (ref 0.61–1.24)
GFR, Estimated: 59 mL/min — ABNORMAL LOW (ref >60)
Glucose, Bld: 144 mg/dL — ABNORMAL HIGH (ref 70–99)
Potassium: 3.8 mmol/L (ref 3.5–5.1)
Sodium: 131 mmol/L — ABNORMAL LOW (ref 135–145)

## 2020-05-22 LAB — BRAIN NATRIURETIC PEPTIDE: B Natriuretic Peptide: 3473.6 pg/mL — ABNORMAL HIGH (ref 0.0–100.0)

## 2020-05-22 MED ORDER — FUROSEMIDE 10 MG/ML IJ SOLN
80.0000 mg | Freq: Once | INTRAMUSCULAR | Status: AC
  Administered 2020-05-22: 80 mg via INTRAVENOUS

## 2020-05-22 MED ORDER — POTASSIUM CHLORIDE CRYS ER 20 MEQ PO TBCR
40.0000 meq | EXTENDED_RELEASE_TABLET | Freq: Once | ORAL | Status: AC
  Administered 2020-05-22: 40 meq via ORAL

## 2020-05-22 MED ORDER — FUROSEMIDE 10 MG/ML IJ SOLN
INTRAMUSCULAR | Status: AC
  Filled 2020-05-22: qty 8

## 2020-05-22 MED ORDER — POTASSIUM CHLORIDE CRYS ER 20 MEQ PO TBCR
EXTENDED_RELEASE_TABLET | ORAL | Status: AC
  Filled 2020-05-22: qty 2

## 2020-05-22 MED ORDER — DOXYCYCLINE HYCLATE 100 MG PO CAPS: 100.0000 mg | ORAL_CAPSULE | Freq: Two times a day (BID) | ORAL | 0 refills | Status: DC

## 2020-05-22 NOTE — Discharge Instructions (Signed)

## 2020-05-22 NOTE — Progress Notes (Signed)
Subjective:    Patient ID: [name] [name], male    DOB: [birth year], 59 y.o.   MRN: [id number] Chief Complaint  Patient presents with  . Follow-up    Left le blisters     The patient returns today for evaluation.  He was last seen on 05/17/2020 he had a small venous ulceration on the anterior portion of his left lower extremity.  He was being diuresed and at that time he did not wish to go into Unna wraps.  Today he has had multiple blisters and formation of several new venous ulcerations.  He also notes that the skin feels raw.  He is continuing to get diuresis by heart failure.  He denies any fever, chills, nausea, vomiting or diarrhea   Review of Systems  Cardiovascular: Positive for leg swelling.  Skin: Positive for wound.  All other systems reviewed and are negative.      Objective:   Physical Exam Vitals reviewed.  HENT:     Head: Normocephalic.  Cardiovascular:     Rate and Rhythm: Normal rate.     Pulses: Normal pulses.  Pulmonary:     Effort: Pulmonary effort is normal.  Musculoskeletal:     Left lower leg: Edema present.  Skin:    General: Skin is warm and dry.     Comments: Blisters and ulcerations  Neurological:     Mental Status: He is alert and oriented to person, place, and time.  Psychiatric:        Mood and Affect: Mood normal.        Behavior: Behavior normal.        Thought Content: Thought content normal.        Judgment: Judgment normal.     BP 113/77 (BP Location: Right Arm)   Pulse 87   Resp 16   Past Medical History:  Diagnosis Date  . Arthritis   . Asthma   . Atherosclerosis   . BPH (benign prostatic hyperplasia)   . Carotid arterial disease ([place])   . Charcot's joint of foot, right   . Chronic combined systolic (congestive) and diastolic (congestive) heart failure ([place])    a. 02/2013 EF 50% by LV gram; b. 04/2017 Echo: EF 25-30%. diff HK. Gr2 DD. Mod dil LA/RV. PASP 5mHg.  .Marland KitchenCOPD (chronic obstructive pulmonary disease) ([place]   .  Coronary artery disease    a. 2013 S/P PCI of LAD ([facility];  b. 03/2013 PCI: RCA 90p ( 2.5 x 23 mm DES); c. 02/2014 Cath: patent RCA stent->Med Rx; d. 04/2017 Cath: LM nl, LAd 30ost/p, 624m, D1 80ost, LCX 90p/m, OM1 85, RCA 70/20p, 8039m0d->Referred for CT Surg-felt to be poor candidate.  . Diabetic neuropathy ([place] . Diabetic ulcer of right foot ([place]CC[place] . Hernia   . Hyperlipidemia   . Hypertension   . Ischemic cardiomyopathy    a. 04/2017 Echo: EF 25-30%; b. 08/2017 Cardiac MRI ([facility]): EF 19%, sev glob HK. RVEF 20%, mod BAE, triv MR, mild-mod TR. Basal lateral subendocardial infarct (viable), inf/infsept, dist septal ischemia, basal to mid lat peri-infarct ischemia.  . Morbid obesity ([place]CC[place] . Neuropathy   . PAD (peripheral artery disease) ([place]  a. Followed by Dr. DewLucky Cowboy. 08/2010 Periph Angio: RSFA 70-80p (6X60 self-expanding stent); c. 09/2010 Periph Angio: L SFA 100p (7x unknown length self-expanding stent); d. 06/2015 Periph Angio: R SFA short segment occlusion (6x12 self-expanding stent).  . Restless leg syndrome   .  Sleep apnea   . Subclavian artery stenosis, right ([place])   . Syncope and collapse   . Tobacco use    a. 75+ yr hx - still smoking 1ppd, down from 2 ppd.  . Type II diabetes mellitus ([place])   . Varicose vein     Social History   Socioeconomic History  . Marital status: Single    Spouse name: Not on file  . Number of children: 0  . Years of education: 51  . Highest education level: 12th grade  Occupational History  . Occupation: on disability  Tobacco Use  . Smoking status: Current Every Day Smoker    Packs/day: 1.00    Years: 41.00    Pack years: 41.00    Types: Cigarettes  . Smokeless tobacco: Never Used  Vaping Use  . Vaping Use: Never used  Substance and Sexual Activity  . Alcohol use: No  . Drug use: No  . Sexual activity: Yes  Other Topics Concern  . Not on file  Social History Narrative   Lives at home in [place] with girlfriend. Independent at  baseline.   Social Determinants of Health   Financial Resource Strain: Not on file  Food Insecurity: Not on file  Transportation Needs: Not on file  Physical Activity: Not on file  Stress: Not on file  Social Connections: Not on file  Intimate Partner Violence: Not on file    Past Surgical History:  Procedure Laterality Date  . ABDOMINAL AORTAGRAM N/A 06/23/2013   Procedure: ABDOMINAL Maxcine Ham;  Surgeon: Wellington Hampshire, MD;  Location: [facility];  Service: Cardiovascular;  Laterality: N/A;  . AMPUTATION Right 04/01/2018   Procedure: AMPUTATION BELOW KNEE;  Surgeon: Algernon Huxley, MD;  Location: [facility] ORS;  Service: Vascular;  Laterality: Right;  . APPENDECTOMY    . CARDIAC CATHETERIZATION  10/14   [facility]; X1 STENT PROXIMAL RCA  . CARDIAC CATHETERIZATION  02/2010   [facility]  . CARDIAC CATHETERIZATION  02/21/2014   [facility]  . CLAVICLE SURGERY    . HERNIA REPAIR    . IRRIGATION AND DEBRIDEMENT FOOT Right 01/04/2018   Procedure: IRRIGATION AND DEBRIDEMENT FOOT;  Surgeon: Samara Deist, DPM;  Location: [facility] ORS;  Service: Podiatry;  Laterality: Right;  . LEFT HEART CATH AND CORONARY ANGIOGRAPHY N/A 01/20/2020   Procedure: LEFT HEART CATH AND CORONARY ANGIOGRAPHY;  Surgeon: Wellington Hampshire, MD;  Location: [facility];  Service: Cardiovascular;  Laterality: N/A;  . LEFT HEART CATH AND CORS/GRAFTS ANGIOGRAPHY N/A 04/28/2017   Procedure: LEFT HEART CATH AND CORONARY ANGIOGRAPHY;  Surgeon: Wellington Hampshire, MD;  Location: [place] [facility];  Service: Cardiovascular;  Laterality: N/A;  . LOWER EXTREMITY ANGIOGRAPHY Right 03/03/2018   Procedure: LOWER EXTREMITY ANGIOGRAPHY;  Surgeon: Katha Cabal, MD;  Location: [facility];  Service: Cardiovascular;  Laterality: Right;  . LOWER EXTREMITY ANGIOGRAPHY Left 08/24/2018   Procedure: LOWER EXTREMITY ANGIOGRAPHY;  Surgeon: Algernon Huxley, MD;  Location: [place] [facility];  Service: Cardiovascular;  Laterality: Left;  . LOWER  EXTREMITY ANGIOGRAPHY Left 06/14/2019   Procedure: LOWER EXTREMITY ANGIOGRAPHY;  Surgeon: Algernon Huxley, MD;  Location: [place] [facility];  Service: Cardiovascular;  Laterality: Left;  . LOWER EXTREMITY ANGIOGRAPHY Left 08/02/2019   Procedure: LOWER EXTREMITY ANGIOGRAPHY;  Surgeon: Algernon Huxley, MD;  Location: [facility];  Service: Cardiovascular;  Laterality: Left;  . PERIPHERAL ARTERIAL STENT GRAFT     x2 left/right  . PERIPHERAL VASCULAR  BALLOON ANGIOPLASTY Right 01/06/2018   Procedure: PERIPHERAL VASCULAR BALLOON ANGIOPLASTY;  Surgeon: Katha Cabal, MD;  Location: [place];  Service: Cardiovascular;  Laterality: Right;  . PERIPHERAL VASCULAR CATHETERIZATION N/A 07/06/2015   Procedure: Abdominal Aortogram w/Lower Extremity;  Surgeon: Algernon Huxley, MD;  Location: [place] CV [facility];  Service: Cardiovascular;  Laterality: N/A;  . PERIPHERAL VASCULAR CATHETERIZATION  07/06/2015   Procedure: Lower Extremity Intervention;  Surgeon: Algernon Huxley, MD;  Location: [place] [facility];  Service: Cardiovascular;;    Family History  Problem Relation Age of Onset  . Heart attack Father   . Heart disease Father   . Hypertension Father   . Hyperlipidemia Father   . Hypertension Mother   . Hyperlipidemia Mother     Allergies  Allergen Reactions  . Dulaglutide Anaphylaxis, Diarrhea and Hives  . Other Itching    Skin itching associated with nitro patch  . Prednisone Rash    CBC Latest Ref Rng & Units 03/26/2020 01/20/2020 01/19/2020  WBC 4.0 - 10.5 K/uL 6.1 4.7 6.3  Hemoglobin 13.0 - 17.0 g/dL 13.3 10.6(L) 12.1(L)  Hematocrit 39.0 - 52.0 % 41.2 32.0(L) 36.5(L)  Platelets 150 - 400 K/uL 177 153 177      CMP     Component Value Date/Time   NA 131 (L) 05/19/2020 1338   NA 134 11/03/2019 1406   NA 136 06/08/2014 1037   K 4.6 05/19/2020 1338   K 4.3 06/08/2014 1037   CL 93 (L) 05/19/2020 1338   CL 101 06/08/2014 1037   CO2 27 05/19/2020 1338   CO2 27 06/08/2014  1037   GLUCOSE 121 (H) 05/19/2020 1338   GLUCOSE 415 (H) 06/08/2014 1037   BUN 21 (H) 05/19/2020 1338   BUN 25 (H) 11/03/2019 1406   BUN 9 06/08/2014 1037   CREATININE 1.39 (H) 05/19/2020 1338   CREATININE 0.87 02/05/2018 1417   CALCIUM 8.6 (L) 05/19/2020 1338   CALCIUM 9.2 06/08/2014 1037   PROT 6.5 09/17/2019 0056   PROT 6.1 01/29/2018 1602   PROT 7.3 03/07/2014 1955   ALBUMIN 3.4 (L) 09/17/2019 0056   ALBUMIN 3.3 (L) 01/29/2018 1602   ALBUMIN 3.9 03/07/2014 1955   AST 23 09/17/2019 0056   AST 17 03/07/2014 1955   ALT 10 09/17/2019 0056   ALT 23 03/07/2014 1955   ALKPHOS 93 09/17/2019 0056   ALKPHOS 105 03/07/2014 1955   BILITOT 1.2 09/17/2019 0056   BILITOT 0.6 01/29/2018 1602   BILITOT 0.6 03/07/2014 1955   GFRNONAA 59 (L) 05/19/2020 1338   GFRNONAA >60 06/08/2014 1037   GFRNONAA >60 02/17/2014 0909   GFRAA >60 01/20/2020 0352   GFRAA >60 06/08/2014 1037   GFRAA >60 02/17/2014 0909     No results found.     Assessment & Plan:   1. Cellulitis of left lower leg Given the significant change in erythema to the patient's left lower extremity we will send antibiotics for treatment of cellulitis.  We will have the patient present on a weekly basis for Unna wrap changes.  We will reevaluate lower extremity if needed.  - doxycycline (VIBRAMYCIN) 100 MG capsule; Take 1 capsule (100 mg total) by mouth 2 (two) times daily.  Dispense: 20 capsule; Refill: 0  2. Venous ulcer of left leg (HCC) No surgery or intervention at this point in time.    I have had a long discussion with the patient regarding venous insufficiency and why it  causes symptoms, specifically venous  ulceration . I have discussed with the patient the chronic skin changes that accompany venous insufficiency and the long term sequela such as infection and recurring  ulceration.  Patient will be placed in [employer] which will be changed weekly drainage permitting.  In addition, behavioral modification including  several periods of elevation of the lower extremities during the day will be continued. Achieving a position with the ankles at heart level was stressed to the patient  Patient will return in 4 weeks for wound evaluation.  3. Atherosclerosis of native arteries of the extremities with ulceration [facility]) Patient has upcoming noninvasive studies to assess his lower extremity arterial perfusion.  4. HFrEF (heart failure with reduced ejection fraction) ([place]) Patient will continue with IV diuretic diuresis as scheduled by heart failure   Current Outpatient Medications on File Prior to Visit  Medication Sig Dispense Refill  . albuterol (VENTOLIN HFA) 108 (90 Base) MCG/ACT inhaler Inhale into the lungs every 6 (six) hours as needed for wheezing or shortness of breath.    [name] atorvastatin (LIPITOR) 80 MG tablet Take 80 mg by mouth daily.    . budesonide-formoterol (SYMBICORT) 80-4.5 MCG/ACT inhaler Inhale 2 puffs into the lungs as needed.    . clopidogrel (PLAVIX) 75 MG tablet Take 75 mg by mouth daily.     . collagenase (SANTYL) ointment Apply 1 application topically daily. 90 g 1  . DULoxetine (CYMBALTA) 20 MG capsule Take 30 mg by mouth daily.     [name] ELIQUIS 5 MG TABS tablet Take 5 mg by mouth daily.     . empagliflozin (JARDIANCE) 10 MG TABS tablet Take 10 mg by mouth daily.     [name] ezetimibe (ZETIA) 10 MG tablet Take 10 mg by mouth daily.     [name] gabapentin (NEURONTIN) 800 MG tablet Take 800 mg by mouth 4 (four) times daily.     . Ipratropium-Albuterol (COMBIVENT RESPIMAT) 20-100 MCG/ACT AERS respimat Inhale 1 puff into the lungs every 6 (six) hours as needed for wheezing.    [name] ipratropium-albuterol (DUONEB) 0.5-2.5 (3) MG/3ML SOLN Inhale 3 mLs into the lungs every 6 (six) hours as needed (wheezing/sob).     . isosorbide mononitrate (IMDUR) 30 MG 24 hr tablet Take 30 mg by mouth daily.    . magnesium oxide (MAG-OX) 400 MG tablet Take 400 mg by mouth.     . metFORMIN (GLUCOPHAGE-XR) 500 MG 24 hr tablet  Take 1,000 mg by mouth 2 (two) times daily.     . metoprolol succinate (TOPROL-XL) 25 MG 24 hr tablet Take 1 tablet (25 mg total) by mouth daily. 90 tablet 3  . mometasone-formoterol (DULERA) 200-5 MCG/ACT AERO Inhale 2 puffs into the lungs as needed.     . Multiple Vitamin (MULTI-VITAMINS) TABS Take 1 tablet by mouth daily.     . mupirocin ointment (BACTROBAN) 2 % SMARTSIG:1 Application Topical 2-3 Times Daily    . naloxone (NARCAN) 4 MG/0.1ML LIQD nasal spray kit Place into the nose as directed.     . nitroGLYCERIN (NITROSTAT) 0.4 MG SL tablet Place 1 tablet (0.4 mg total) under the tongue every 5 (five) minutes as needed for chest pain. 30 tablet 5  . nystatin (MYCOSTATIN) 100000 UNIT/ML suspension Take 5 mLs by mouth 4 (four) times daily.    [name] omeprazole (PRILOSEC) 40 MG capsule Take 40 mg by mouth daily.    . Oxycodone HCl 10 MG TABS Take 10 mg by mouth every 6 (six) hours as needed (pain).     [name]  pantoprazole (PROTONIX) 40 MG tablet Take 40 mg by mouth daily.    . potassium chloride (KLOR-CON) 10 MEQ tablet Take 20 mEq by mouth daily.    . promethazine (PHENERGAN) 25 MG tablet Take 25 mg by mouth every 6 (six) hours as needed.     . sodium chloride 1 g tablet Take 1 tablet (1 g total) by mouth daily. 30 tablet 3  . SYMBICORT 80-4.5 MCG/ACT inhaler Inhale 2 puffs into the lungs daily.     [name] torsemide (DEMADEX) 20 MG tablet Take 2 tablets (40 mg total) by mouth 2 (two) times daily. 120 tablet 4  . traZODone (DESYREL) 150 MG tablet Take 150 mg by mouth at bedtime.    [name] umeclidinium bromide (INCRUSE ELLIPTA) 62.5 MCG/INH AEPB Inhale 1 puff into the lungs daily.     . Vericiguat 5 MG TABS Take 5 mg by mouth daily.     Current Facility-Administered Medications on File Prior to Visit  Medication Dose Route Frequency Provider Last Rate Last Admin  . furosemide (LASIX) 10 MG/ML injection           . potassium chloride SA (KLOR-CON) 20 MEQ CR tablet             There are no Patient Instructions  on file for this visit. No follow-ups on file.   Kris Hartmann, NP

## 2020-05-22 NOTE — Telephone Encounter (Signed)
This RN has reached out to several agencies regarding home IV lasix 3 times a week and at this time, there is no assistance at this time for pt. [facility], [facility], and [facility] are not able to assist pt. Based on pt's noted, there was an appt on 12/9 to have pt consulted for palliative care and there are schedule appts for short stay the month of Dec 2021 for IV lasixs 3 times a week. Will route a note to [facility] and Dr. Rockey Situ.

## 2020-05-24 ENCOUNTER — Ambulatory Visit
Admission: RE | Admit: 2020-05-24 | Discharge: 2020-05-24 | Disposition: A | Discharge status: Home / Self Care | Payer: Medicare Other | Source: Ambulatory Visit | Attending: Family | Admitting: Family

## 2020-05-24 ENCOUNTER — Other Ambulatory Visit: Payer: Self-pay

## 2020-05-24 DIAGNOSIS — I5022 Chronic systolic (congestive) heart failure: Secondary | ICD-10-CM | POA: Insufficient documentation

## 2020-05-24 LAB — BASIC METABOLIC PANEL
Anion gap: 10 (ref 5–15)
BUN: 14 mg/dL (ref 6–20)
CO2: 31 mmol/L (ref 22–32)
Calcium: 9 mg/dL (ref 8.9–10.3)
Chloride: 93 mmol/L — ABNORMAL LOW (ref 98–111)
Creatinine, Ser: 0.95 mg/dL (ref 0.61–1.24)
GFR, Estimated: >60 mL/min (ref >60)
Glucose, Bld: 131 mg/dL — ABNORMAL HIGH (ref 70–99)
Potassium: 3.9 mmol/L (ref 3.5–5.1)
Sodium: 134 mmol/L — ABNORMAL LOW (ref 135–145)

## 2020-05-24 LAB — BRAIN NATRIURETIC PEPTIDE: B Natriuretic Peptide: 2932.7 pg/mL — ABNORMAL HIGH (ref 0.0–100.0)

## 2020-05-24 IMAGING — CR DG CHEST 2V
1 series · 2 of 2 positions shown · non-contrast
Comparison: Most recent radiographs and CT 04/22/2017

CLINICAL DATA: Chest pain and shortness of breath.

EXAM:
CHEST - 2 VIEW

[Series 1: dg chest 2 view · 0.14mm/px · 2 of 2 slices shown]
[im 1/2]
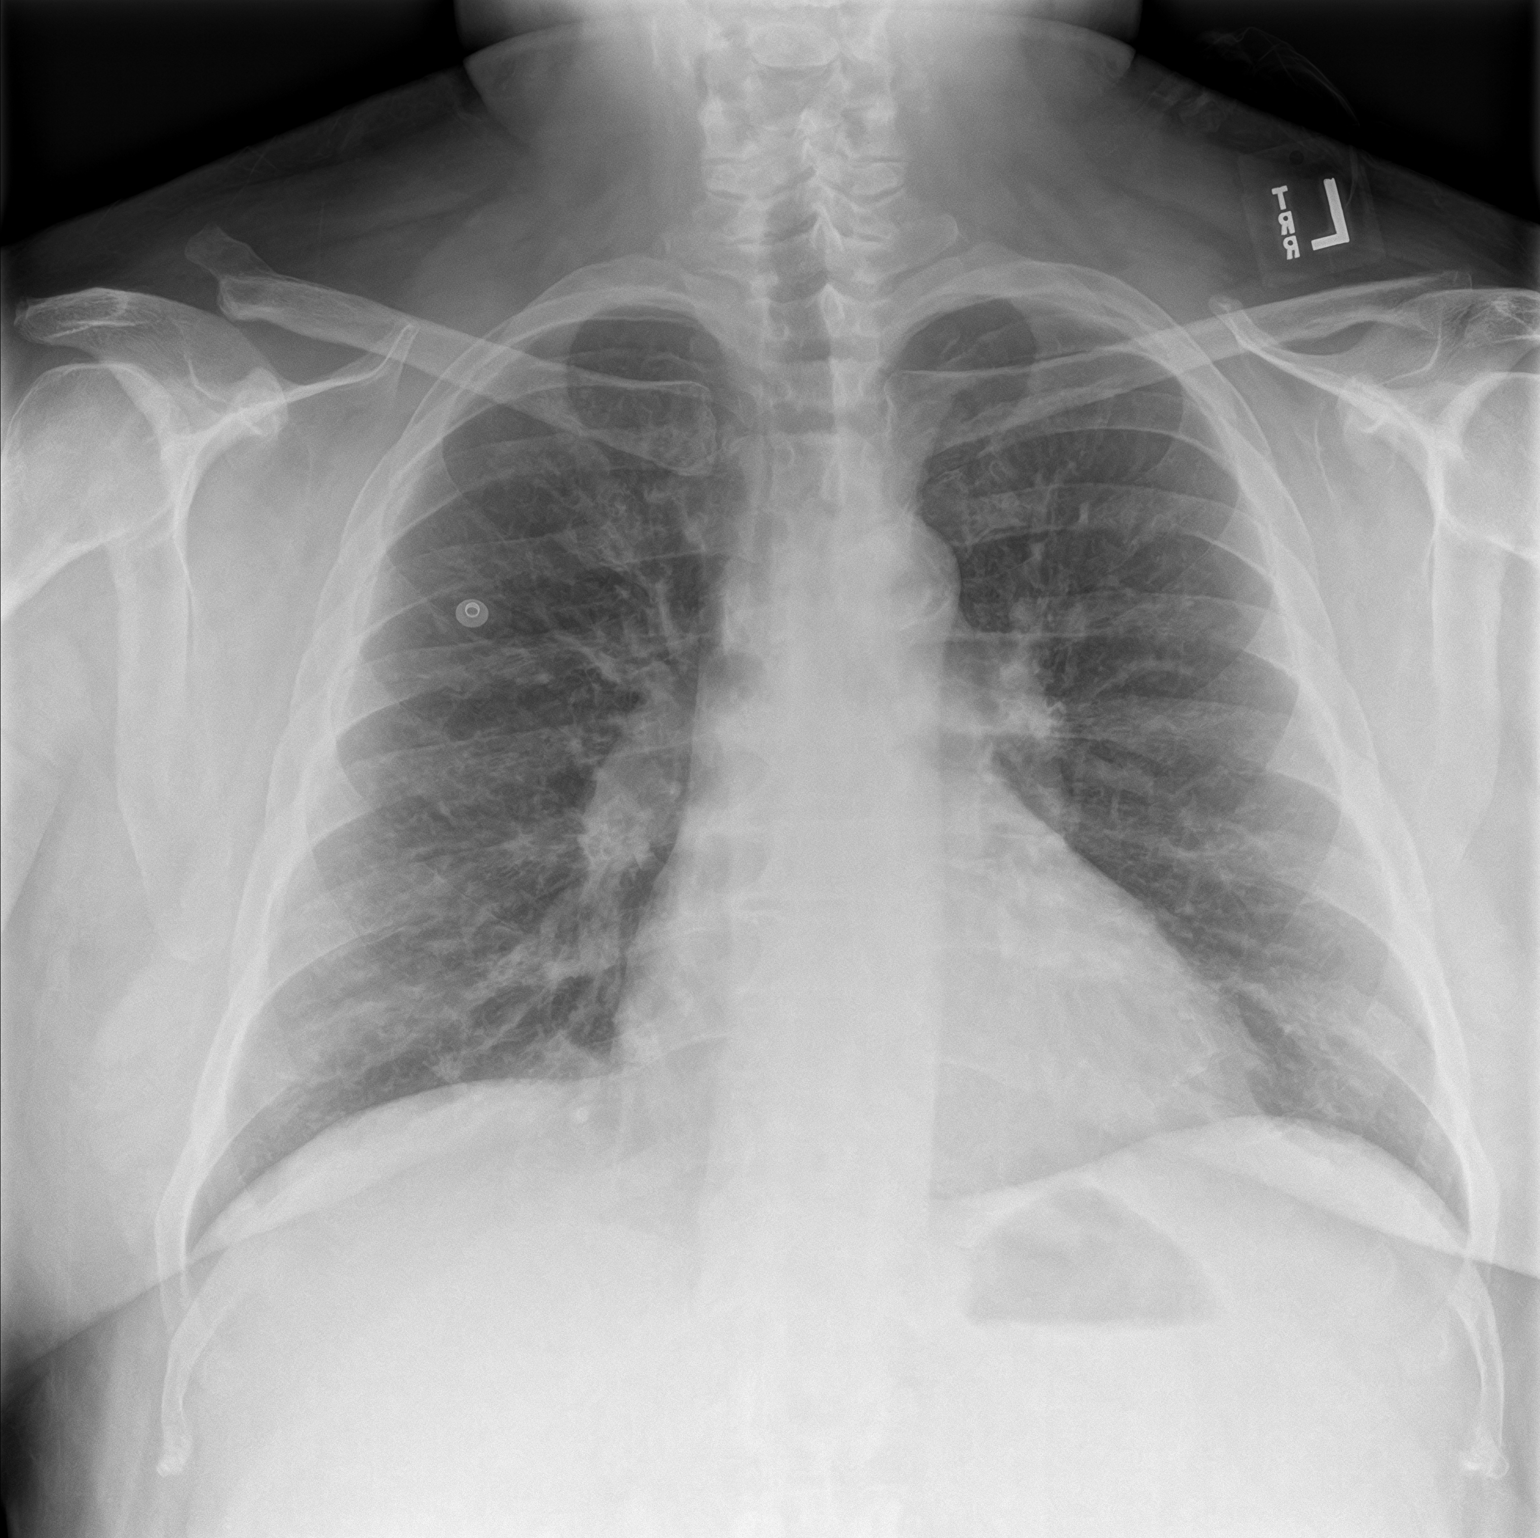
[im 2/2]
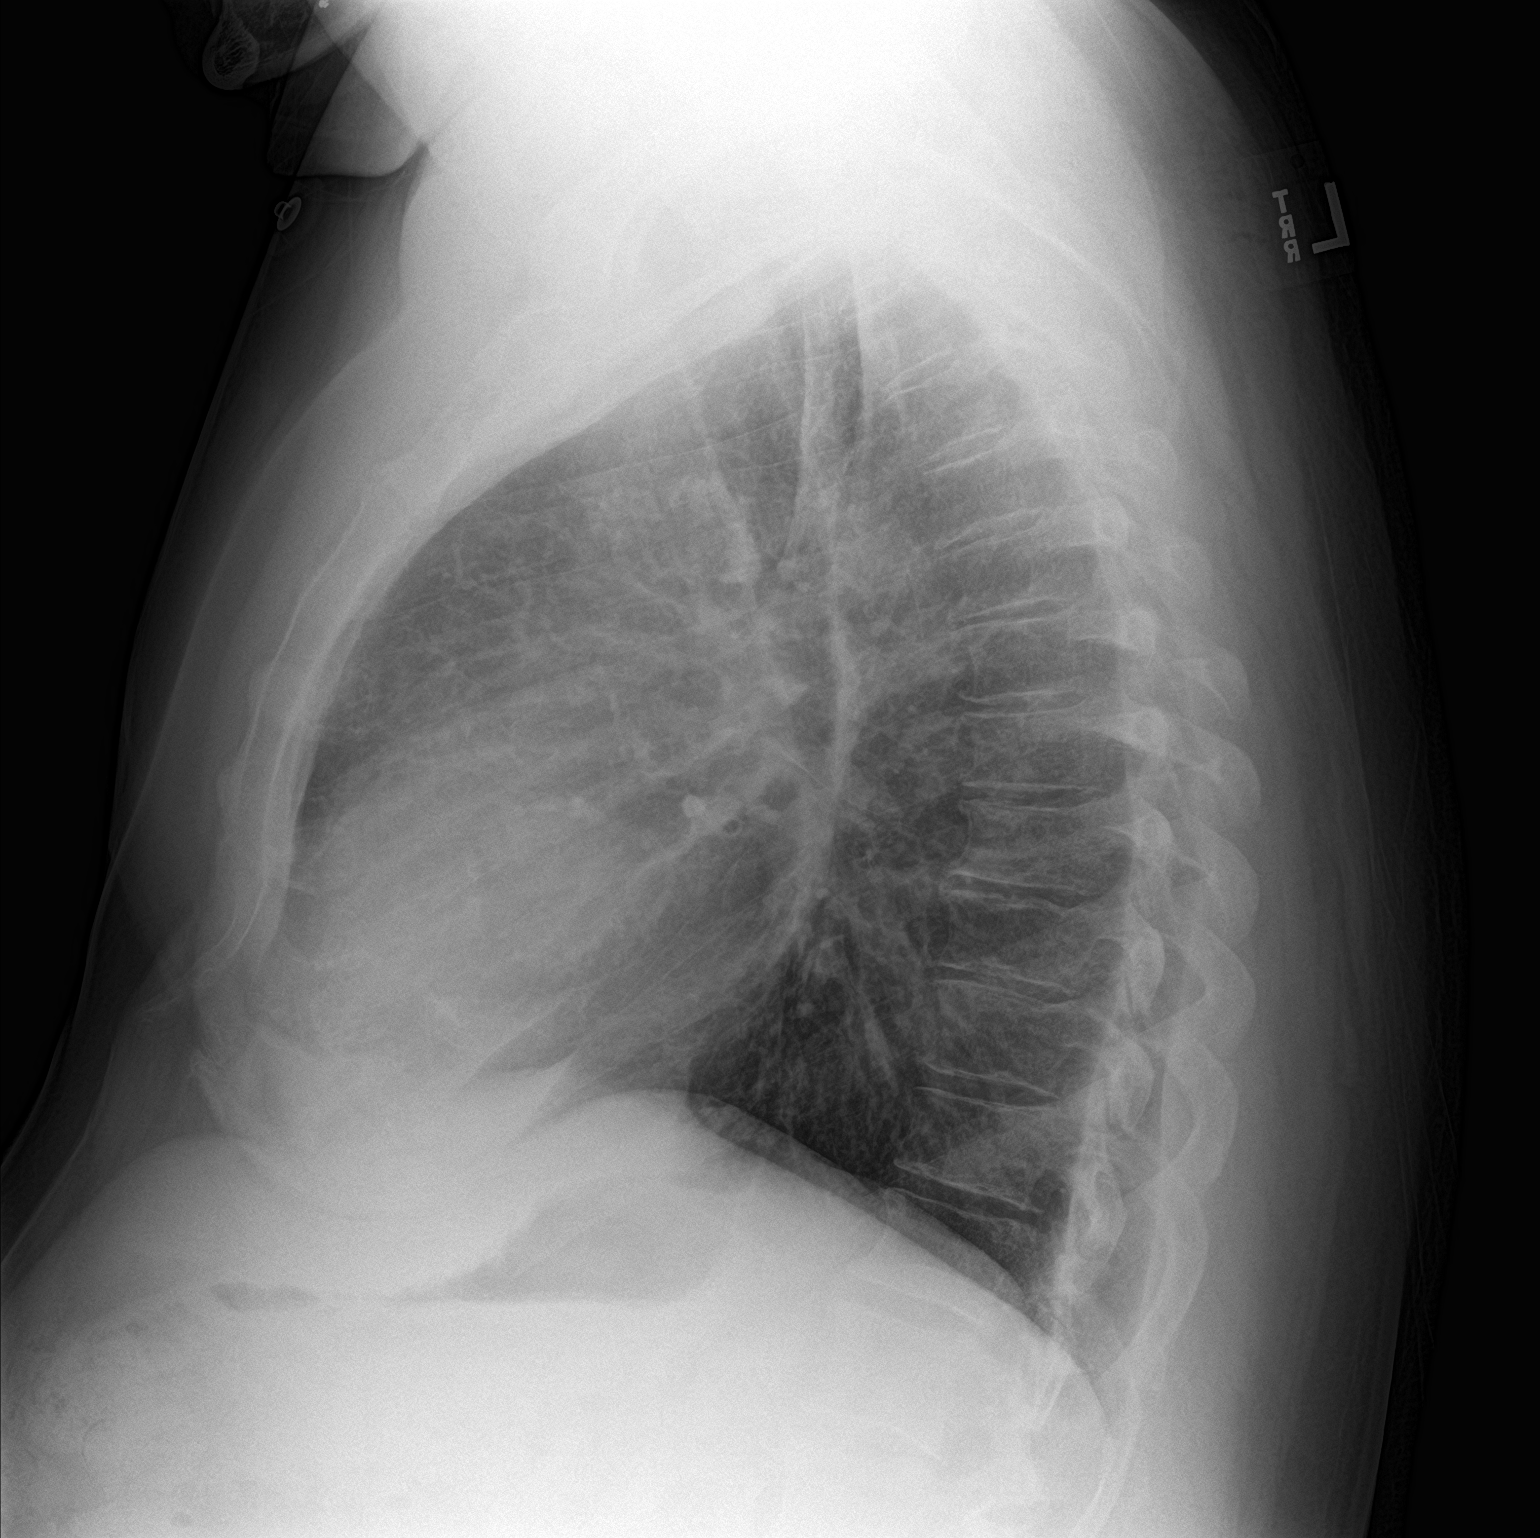

[2 of 2 positions shown; findings below may reference images not displayed]

FINDINGS: Borderline cardiomegaly, similar to prior exam. There is
atherosclerosis of the aortic arch. Central peribronchial thickening
without evidence of superimposed edema. No focal airspace disease,
pleural effusion or pneumothorax. Chronic deformity of the distal
right clavicle.
IMPRESSION: Borderline cardiomegaly. Chronic central bronchial thickening.
Resolved pulmonary edema and pleural effusions from prior exam

## 2020-05-24 MED ORDER — POTASSIUM CHLORIDE CRYS ER 20 MEQ PO TBCR
EXTENDED_RELEASE_TABLET | ORAL | Status: AC
  Administered 2020-05-24: 40 meq via ORAL
  Filled 2020-05-24: qty 2

## 2020-05-24 MED ORDER — FUROSEMIDE 10 MG/ML IJ SOLN: 80.0000 mg | Freq: Once | INTRAMUSCULAR | Status: AC

## 2020-05-24 MED ORDER — POTASSIUM CHLORIDE CRYS ER 20 MEQ PO TBCR: 40.0000 meq | EXTENDED_RELEASE_TABLET | Freq: Once | ORAL | Status: AC

## 2020-05-24 MED ORDER — FUROSEMIDE 10 MG/ML IJ SOLN
INTRAMUSCULAR | Status: AC
  Administered 2020-05-24: 80 mg via INTRAVENOUS
  Filled 2020-05-24: qty 8

## 2020-05-26 ENCOUNTER — Ambulatory Visit
Admission: RE | Admit: 2020-05-26 | Discharge: 2020-05-26 | Disposition: A | Discharge status: Home / Self Care | Payer: Medicare Other | Source: Ambulatory Visit | Attending: Family | Admitting: Family

## 2020-05-26 ENCOUNTER — Other Ambulatory Visit: Payer: Self-pay

## 2020-05-26 DIAGNOSIS — I5043 Acute on chronic combined systolic (congestive) and diastolic (congestive) heart failure: Secondary | ICD-10-CM | POA: Insufficient documentation

## 2020-05-26 LAB — BRAIN NATRIURETIC PEPTIDE: B Natriuretic Peptide: 2910.2 pg/mL — ABNORMAL HIGH (ref 0.0–100.0)

## 2020-05-26 LAB — BASIC METABOLIC PANEL
Anion gap: 12 (ref 5–15)
BUN: 11 mg/dL (ref 6–20)
CO2: 27 mmol/L (ref 22–32)
Calcium: 8.9 mg/dL (ref 8.9–10.3)
Chloride: 92 mmol/L — ABNORMAL LOW (ref 98–111)
Creatinine, Ser: 0.88 mg/dL (ref 0.61–1.24)
GFR, Estimated: >60 mL/min (ref >60)
Glucose, Bld: 118 mg/dL — ABNORMAL HIGH (ref 70–99)
Potassium: 3.6 mmol/L (ref 3.5–5.1)
Sodium: 131 mmol/L — ABNORMAL LOW (ref 135–145)

## 2020-05-26 MED ORDER — POTASSIUM CHLORIDE CRYS ER 20 MEQ PO TBCR: 40.0000 meq | EXTENDED_RELEASE_TABLET | Freq: Once | ORAL | Status: AC

## 2020-05-26 MED ORDER — FUROSEMIDE 10 MG/ML IJ SOLN: 80.0000 mg | Freq: Once | INTRAMUSCULAR | Status: DC

## 2020-05-26 MED ORDER — FUROSEMIDE 10 MG/ML IJ SOLN: 80.0000 mg | Freq: Once | INTRAMUSCULAR | Status: AC

## 2020-05-26 MED ORDER — FUROSEMIDE 10 MG/ML IJ SOLN
INTRAMUSCULAR | Status: AC
  Administered 2020-05-26: 80 mg via INTRAVENOUS
  Filled 2020-05-26: qty 8

## 2020-05-26 MED ORDER — POTASSIUM CHLORIDE CRYS ER 20 MEQ PO TBCR: 40.0000 meq | EXTENDED_RELEASE_TABLET | Freq: Once | ORAL | Status: DC

## 2020-05-26 MED ORDER — POTASSIUM CHLORIDE CRYS ER 20 MEQ PO TBCR
EXTENDED_RELEASE_TABLET | ORAL | Status: AC
  Administered 2020-05-26: 40 meq via ORAL
  Filled 2020-05-26: qty 2

## 2020-05-29 ENCOUNTER — Encounter (INDEPENDENT_AMBULATORY_CARE_PROVIDER_SITE_OTHER): Payer: Medicare Other

## 2020-05-29 ENCOUNTER — Ambulatory Visit: Admission: RE | Admit: 2020-05-29 | Payer: Medicare Other | Source: Ambulatory Visit

## 2020-05-29 ENCOUNTER — Other Ambulatory Visit: Payer: Medicare Other | Admitting: Primary Care

## 2020-05-29 ENCOUNTER — Other Ambulatory Visit: Payer: Self-pay

## 2020-05-29 DIAGNOSIS — I25118 Atherosclerotic heart disease of native coronary artery with other forms of angina pectoris: Secondary | ICD-10-CM

## 2020-05-29 DIAGNOSIS — I872 Venous insufficiency (chronic) (peripheral): Secondary | ICD-10-CM

## 2020-05-29 DIAGNOSIS — Z515 Encounter for palliative care: Secondary | ICD-10-CM

## 2020-05-29 DIAGNOSIS — I5020 Unspecified systolic (congestive) heart failure: Secondary | ICD-10-CM

## 2020-05-29 NOTE — Progress Notes (Signed)
Date of encounter: 05/29/20 PATIENT NAME: [name] [name] [id number] (home)  DOB: Jun 03, 1961 MRN: [id number]  PRIMARY CARE PROVIDER:    [name], MD,  [place] [address] [phone]  REFERRING PROVIDER:   Darylene Price NP   RESPONSIBLE PARTY:   Extended Emergency Contact Information Primary Emergency Contact: [name] [name],[name] [name] Address: [address]          [place], [place] [address] Johnnette Litter of [place] Phone: [phone] Mobile Phone: [phone] Relation: Significant other  I met face to face with patient and family [name] [name]  In the  Home. Palliative Care was asked to follow this patient by consultation request of [name] Price NP at the heart failure clinic to help address advance care planning and complex medical decision making. This is the initial visit.   ASSESSMENT AND RECOMMENDATIONS:   1. Advance Care Planning/Goals of Care: Goals include to maximize quality of life and symptom management. Our advance care planning conversation included a discussion about:     The value and importance of advance care planning   Exploration of personal, cultural or spiritual beliefs that might influence medical decisions   Exploration of goals of care in the event of a sudden injury or illness   Identification  of a healthcare agent - SO [name] [name] , formal paper work incomplete.  Review of an  advance directive document- MOST form  SO states they have decided for a DNR. Pt does not verbalize the same. We began preparing MOST  form today but patient stated he wants to think about it. I left the MOST  form as well as a five wishes book. We discussed the wisdom of having a power of attorney for healthcare established, as his partner would be superseded by a sister should power of attorney issue come to question. She understands this and would like to get the power of attorney done  soon. We will continue to discuss MOST  choices on the next visit. SO stated that they had both decided they would not want to be prolonged with a  poor prognosis.  2. Symptom Management:   Polypharmacy. We reviewed his medicines. He has many and states they are onerous to take. He goes to [facility] in [place] which does a pre-packaging program. He said he would be interested in that. I asked him to return the meds to the pharmacy and ask for the packaging. He also has several duplicates eg  He has two types of proton pumps and three types of anticoagulants.   Pain: endorses pain for which he takes oxycodone PRN. 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 These are being treated at vascular clinic.   Smoking cessation Endorses at one pack a day current habit. Discussed linkt to his heart and vascular disease. He states he does not want to dry Chantix ,Wellbutrin or nicotine patches. States hes trying to quit but no real plan.  Dyspnea: Endorses  COPD and dyspnea. Currently has albuterol rescue inhaler, Dulera and Symbicort. Denies having Incruse although on his med list. Also endorses taking duo neb nebulizers from the same set up as his partner. I encourage them to obtain separate disposable nebulizing kits even if they continue to use the same compressor.   Heart failure: he described getting Lasix three times a week by IV and  that this may last a few weeks. Notes would imply that it may go on longer. He states that it is not too burdensome for him to go to his appointments although he missed one today. He may be a poor candidate for ongoing 3x/ week treatments due to burden of leaving home. If goals are concordant, he may benefit from picc line and in home infusion.  3. Follow up Palliative Care Visit: Palliative care will continue to follow for goals of care clarification and symptom management. Return 4-6 weeks or  prn.  4. Family /Caregiver/Community Supports: [place] SO[place] 4 [place] Has [place] ramp, w/c van.  5. Cognitive / Functional decline: A and O x 3, non ambulatory but can pivot transfer. Dependent in most adls and iadls.  I spent 75 minutes providing this consultation,  from 1500 to 1615. More than 50% of the time in this consultation was spent coordinating communication.   CODE STATUS: TBD  PPS: 50%  HOSPICE ELIGIBILITY/DIAGNOSIS: TBD  Subjective:   CHIEF COMPLAINT: self care deficits  HISTORY OF PRESENT ILLNESS:  [name] [name] is a 59 y.o. year old male  with HFrEF, RBKA, chronic pain and poly pharmacy.  We are asked to consult around advance care planning and complex medical decision making.   Review and summarization of old Epic records shows or history from other than patient. Review of case with family member [name] [name].  History obtained from review of EMR, discussion with primary team, and  interview with family, caregiver  and/or [name] [name]. Records reviewed and summarized above.   CURRENT PROBLEM LIST:  Patient Active Problem List   Diagnosis Date Noted   Chronic venous insufficiency 02/10/2020   HFrEF (heart failure with reduced ejection fraction) (HCC) 01/30/2020   COPD with acute exacerbation (HCC) 01/30/2020   Atrial fibrillation (HCC) 01/19/2020   Chronic bilateral low back pain with sciatica 10/08/2019   Medication management contract signed 10/08/2019   Cellulitis of left lower leg 09/27/2019   Cellulitis of foot, left 09/27/2019   Hyponatremia 09/27/2019   Cellulitis of left leg 08/31/2019   CAD (coronary artery disease) 08/31/2019   Acute on chronic systolic CHF (congestive heart failure) (HCC) 11/05/2018   Open toe wound 10/01/2018   Venous ulcer of left leg (HCC) 08/19/2018   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HCC) 04/28/2018   Sepsis (HCC) 03/30/2018   Abnormal MRI, shoulder (Right) 02/23/2018   Abnormal  MRI, cervical spine (2016) 02/23/2018   DDD (degenerative disc disease), cervical 02/23/2018   Cervical foraminal stenosis (Bilateral) 02/23/2018   Cervical central spinal stenosis 02/23/2018   Cervical facet hypertrophy 02/23/2018   Arthralgia of acromioclavicular joint (Right) 02/23/2018   Osteoarthritis of  AC (acromioclavicular) joint (Right) 02/23/2018   Biceps tendinosis of shoulder (Right) 02/23/2018   Tendinopathy of rotator cuff (Right) 02/23/2018   Vitamin D deficiency 02/23/2018   Atherosclerosis of native arteries of the extremities with ulceration (HCC) 02/15/2018   Peripheral edema 02/05/2018   Status post peripherally inserted central catheter (PICC) central line placement 02/04/2018   Chronic lower extremity pain (Primary Area of Pain) (Right) 01/29/2018   Chronic shoulder pain (Tertiary Area of Pain) (Right) 01/29/2018   Chronic foot pain (Secondary Area of Pain) (Right) 01/29/2018   Chronic pain syndrome 01/29/2018   Long term current use of opiate analgesic 01/29/2018   Medication monitoring encounter 01/29/2018   Disorder of skeletal system 01/29/2018   Problems influencing health status 01/29/2018   AKI (acute  kidney injury) ([facility]) 01/03/2018   NSTEMI (non-ST elevated myocardial infarction) ([place]) 11/21/2017   Coronary artery disease of native artery of native heart with stable angina pectoris ([place]) 09/10/2017   HLD (hyperlipidemia) 09/10/2017   Angina pectoris ([place]) 09/08/2017   Angina at rest [facility]) 08/13/2017   Lymphedema 16/03/9603   Chronic systolic CHF (congestive heart failure) ([place]) 04/23/2017   Chest pain 07/11/2016   Unstable angina ([place]) 07/11/2016   Foot ulcer (Right)    Stable angina (HCC)    Pressure injury of skin 05/06/2016   Diabetic foot infection ([place]) 05/05/2016   Type 2 diabetes mellitus with other specified complication ([facility]) 54/02/8118   Cellulitis 11/21/2015   Morbid obesity ([place]) 11/17/2014   PAD  (peripheral artery disease) (HCC)    Hypertension    Tobacco use    COPD (chronic obstructive pulmonary disease) ([place]) 05/19/2012   Diabetic neuropathy ([place]) 05/19/2012   Peripheral vascular disease ([place]) 03/27/2012   PAST MEDICAL HISTORY:  Active Ambulatory Problems    Diagnosis Date Noted   PAD (peripheral artery disease) ([facility] [place])    Coronary artery disease of native artery of native heart with stable angina pectoris ([place]) 09/10/2017   HLD (hyperlipidemia) 09/10/2017   Hypertension    Tobacco use    Morbid obesity ([place]) 11/17/2014   Cellulitis 11/21/2015   Diabetic foot infection ([place]) 05/05/2016   Pressure injury of skin 05/06/2016   Chest pain 07/11/2016   Unstable angina ([place]) 07/11/2016   Foot ulcer (Right)    Stable angina (HCC)    Chronic systolic CHF (congestive heart failure) ([place]) 04/23/2017   Type 2 diabetes mellitus with other specified complication ([place]) 14/78/2956   Lymphedema 05/02/2017   NSTEMI (non-ST elevated myocardial infarction) ([place]) 11/21/2017   AKI (acute kidney injury) ([place]) 01/03/2018   COPD (chronic obstructive pulmonary disease) ([place]) 05/19/2012   Diabetic neuropathy ([place]) 05/19/2012   Angina at rest [facility]) 08/13/2017   Angina pectoris ([place]) 09/08/2017   Chronic lower extremity pain (Primary Area of Pain) (Right) 01/29/2018   Chronic shoulder pain (Tertiary Area of Pain) (Right) 01/29/2018   Chronic foot pain (Secondary Area of Pain) (Right) 01/29/2018   Chronic pain syndrome 01/29/2018   Long term current use of opiate analgesic 01/29/2018   Medication monitoring encounter 01/29/2018   Disorder of skeletal system 01/29/2018   Problems influencing health status 01/29/2018   Status post peripherally inserted central catheter (PICC) central line placement 02/04/2018   Peripheral edema 02/05/2018   Atherosclerosis of native arteries of the extremities with ulceration ([place]) 02/15/2018   Abnormal MRI, shoulder  (Right) 02/23/2018   Abnormal MRI, cervical spine (2016) 02/23/2018   DDD (degenerative disc disease), cervical 02/23/2018   Cervical foraminal stenosis (Bilateral) 02/23/2018   Cervical central spinal stenosis 02/23/2018   Cervical facet hypertrophy 02/23/2018   Arthralgia of acromioclavicular joint (Right) 02/23/2018   Osteoarthritis of  AC (acromioclavicular) joint (Right) 02/23/2018   Biceps tendinosis of shoulder (Right) 02/23/2018   Tendinopathy of rotator cuff (Right) 02/23/2018   Vitamin D deficiency 02/23/2018   Sepsis ([place]) 03/30/2018   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[place]) 04/28/2018   Venous ulcer of left leg ([place]) 08/19/2018   Open toe wound 10/01/2018   Acute on chronic systolic CHF (congestive heart failure) ([place]) 11/05/2018   Cellulitis of left leg 08/31/2019   CAD (coronary artery disease) 08/31/2019   Cellulitis of left lower leg 09/27/2019   Cellulitis of foot, left 09/27/2019   Hyponatremia 09/27/2019   Chronic bilateral low back pain  with sciatica 10/08/2019   Atrial fibrillation ([place]) 01/19/2020   Chronic venous insufficiency 02/10/2020   HFrEF (heart failure with reduced ejection fraction) ([place]) 01/30/2020   COPD with acute exacerbation ([place]) 01/30/2020   Peripheral vascular disease ([place]) 03/27/2012   Medication management contract signed 10/08/2019   Resolved Ambulatory Problems    Diagnosis Date Noted   Hypoxia    Shortness of breath    Sepsis ([place]) 12/22/2017   Past Medical History:  Diagnosis Date   Arthritis    Asthma    Atherosclerosis    BPH (benign prostatic hyperplasia)    Carotid arterial disease (HCC)    Charcot's joint of foot, right    Chronic combined systolic (congestive) and diastolic (congestive) heart failure (HCC)    Coronary artery disease    Diabetic ulcer of right foot (HCC)    Hernia    Hyperlipidemia    Ischemic cardiomyopathy    Neuropathy    Restless leg  syndrome    Sleep apnea    Subclavian artery stenosis, right (HCC)    Syncope and collapse    Type II diabetes mellitus (HCC)    Varicose vein    SOCIAL HX:  Social History   Tobacco Use   Smoking status: Current Every Day Smoker    Packs/day: 1.00    Years: 41.00    Pack years: 41.00    Types: Cigarettes   Smokeless tobacco: Never Used  Substance Use Topics   Alcohol use: No   FAMILY HX:  Family History  Problem Relation Age of Onset   Heart attack Father    Heart disease Father    Hypertension Father    Hyperlipidemia Father    Hypertension Mother    Hyperlipidemia Mother     kj  ALLERGIES:  Allergies  Allergen Reactions   Dulaglutide Anaphylaxis, Diarrhea and Hives   Other Itching    Skin itching associated with nitro patch   Prednisone Rash     PERTINENT MEDICATIONS:  Outpatient Encounter Medications as of 05/29/2020  Medication Sig   albuterol (VENTOLIN HFA) 108 (90 Base) MCG/ACT inhaler Inhale into the lungs every 6 (six) hours as needed for wheezing or shortness of breath.   atorvastatin (LIPITOR) 80 MG tablet Take 80 mg by mouth daily.   budesonide-formoterol (SYMBICORT) 80-4.5 MCG/ACT inhaler Inhale 2 puffs into the lungs as needed.   clopidogrel (PLAVIX) 75 MG tablet Take 75 mg by mouth daily.    collagenase (SANTYL) ointment Apply 1 application topically daily.   doxycycline (VIBRAMYCIN) 100 MG capsule Take 1 capsule (100 mg total) by mouth 2 (two) times daily.   DULoxetine (CYMBALTA) 20 MG capsule Take 30 mg by mouth daily.    ELIQUIS 5 MG TABS tablet Take 5 mg by mouth daily.    empagliflozin (JARDIANCE) 10 MG TABS tablet Take 10 mg by mouth daily.    ezetimibe (ZETIA) 10 MG tablet Take 10 mg by mouth daily.    gabapentin (NEURONTIN) 800 MG tablet Take 800 mg by mouth 4 (four) times daily.    Ipratropium-Albuterol (COMBIVENT RESPIMAT) 20-100 MCG/ACT AERS respimat Inhale 1 puff into the lungs every 6 (six) hours as needed  for wheezing.   ipratropium-albuterol (DUONEB) 0.5-2.5 (3) MG/3ML SOLN Inhale 3 mLs into the lungs every 6 (six) hours as needed (wheezing/sob).    isosorbide mononitrate (IMDUR) 30 MG 24 hr tablet Take 30 mg by mouth daily.   magnesium oxide (MAG-OX) 400 MG tablet Take 400 mg by mouth.  metFORMIN (GLUCOPHAGE-XR) 500 MG 24 hr tablet Take 1,000 mg by mouth 2 (two) times daily.    metoprolol succinate (TOPROL-XL) 25 MG 24 hr tablet Take 1 tablet (25 mg total) by mouth daily.   mometasone-formoterol (DULERA) 200-5 MCG/ACT AERO Inhale 2 puffs into the lungs as needed.    Multiple Vitamin (MULTI-VITAMINS) TABS Take 1 tablet by mouth daily.    mupirocin ointment (BACTROBAN) 2 % SMARTSIG:1 Application Topical 2-3 Times Daily   naloxone (NARCAN) 4 MG/0.1ML LIQD nasal spray kit Place into the nose as directed.    nitroGLYCERIN (NITROSTAT) 0.4 MG SL tablet Place 1 tablet (0.4 mg total) under the tongue every 5 (five) minutes as needed for chest pain.   nystatin (MYCOSTATIN) 100000 UNIT/ML suspension Take 5 mLs by mouth 4 (four) times daily.   omeprazole (PRILOSEC) 40 MG capsule Take 40 mg by mouth daily.   Oxycodone HCl 10 MG TABS Take 10 mg by mouth every 6 (six) hours as needed (pain).    pantoprazole (PROTONIX) 40 MG tablet Take 40 mg by mouth daily.   potassium chloride (KLOR-CON) 10 MEQ tablet Take 20 mEq by mouth daily.   promethazine (PHENERGAN) 25 MG tablet Take 25 mg by mouth every 6 (six) hours as needed.    sodium chloride 1 g tablet Take 1 tablet (1 g total) by mouth daily.   SYMBICORT 80-4.5 MCG/ACT inhaler Inhale 2 puffs into the lungs daily.    torsemide (DEMADEX) 20 MG tablet Take 2 tablets (40 mg total) by mouth 2 (two) times daily.   traZODone (DESYREL) 150 MG tablet Take 150 mg by mouth at bedtime.   umeclidinium bromide (INCRUSE ELLIPTA) 62.5 MCG/INH AEPB Inhale 1 puff into the lungs daily.    Vericiguat 5 MG TABS Take 5 mg by mouth daily.    Facility-Administered Encounter Medications as of 05/29/2020  Medication   furosemide (LASIX) injection 80 mg   potassium chloride SA (KLOR-CON) CR tablet 40 mEq    Objective: ROS  General: NAD EYES: denies vision changes ENMT: denies dysphagia Cardiovascular: denies chest pain, endorses LE edema Pulmonary: denies cough, denies increased SOB, endorses DOE Abdomen: endorses good appetite, denies constipation, endorses continence of bowel GU: denies dysuria, endorses continence of urine MSK:  endorses ROM limitations, no falls reported Skin: Left LE blisters endorsed Neurological: endorses weakness, endorse pain, endorses insomnia Psych: Endorses depressed  mood Heme/lymph/immuno: denies bruises, abnormal bleeding  Physical Exam: Current and past weights: 210 lbs per record Constitutional: NAD General: frail appearing, obese  EYES: anicteric sclera,lids intact, no discharge  ENMT: intact hearing,oral mucous membranes moist, dentition intact CV:  1+ L LE edema Pulmonary: no increased work of breathing, no cough, no audible wheezes, room air Abdomen: intake 75%,  no ascites GU: deferred MSK: mild sarcopenia, decreased ROM in all extremities, R BKA,  non ambulatory Skin: warm and dry, left LE with unna wrap per vascular clinic, deferred examination Neuro: Generalized weakness, no cognitive impairment, grossly non -focal Psych: non-anxious affect, A and O x 3 Hem/lymph/immuno: no widespread bruising   Thank you for the opportunity to participate in the care of Mr. [name].  The palliative care team will continue to follow. Please call our office at [phone] if we can be of additional assistance.  [name] Coop, NP , DNP, MPH, AGPCNP-BC, ACHPN  COVID-19 PATIENT SCREENING TOOL  Person answering questions: ____________self______ _____   1.  Is the patient or any family member in the home showing any signs or symptoms regarding respiratory  infection?                Person with Symptom- __________NA_________________  a. Fever                                                                          Yes___ No___          ___________________  b. Shortness of breath                                                    Yes___ No___          ___________________ c. Cough/congestion                                       Yes___  No___         ___________________ d. Body aches/pains                                                         Yes___ No___        ____________________ e. Gastrointestinal symptoms (diarrhea, nausea)           Yes___ No___        ____________________  2. Within the past 14 days, has anyone living in the home had any contact with someone with or under investigation for COVID-19?    Yes___ No_X_   Person __________________

## 2020-05-29 NOTE — Progress Notes (Signed)
Patient ID: [name] [name], male    DOB: 04/26/1961, 59 y.o.   MRN: [id number]  HPI  [name] [name] is a 59 y/o male with a history of obstructive sleep apnea, PVD, PAD, HTN, hyperlipidemia, DM, CAD, COPD, BPH, asthma, current tobacco use and chronic heart failure.   Echo report from 01/20/20 reviewed and showed an EF of <20% along with mild [name] and moderate TR. Echo report from 09/29/19 reviewed and showed an EF of 20-25% along with moderately elevated PA pressure, mild [name] and mild/moderate TR. Echo report from 11/06/2018 reviewed and showed an EF of 20-25% along with moderate TR and a PA pressure of 59.6 mmHg. Echo report from 04/25/17 reviewed and shows an EF of 25-30% along with a PA pressure of 34 mm Hg. EF has declined from 55-60% back in 2016.   LHC done 01/20/20 showed:  Prox RCA-1 lesion is 80% stenosed.  Prox RCA-2 lesion is 20% stenosed.  Prox RCA to Mid RCA lesion is 80% stenosed.  Prox LAD lesion is 20% stenosed.  1st Diag lesion is 80% stenosed.  Mid LAD lesion is 20% stenosed.  Dist LAD lesion is 60% stenosed.  Prox Cx to Mid Cx lesion is 90% stenosed.  LPAV lesion is 95% stenosed.   1. Significant underlying three-vessel coronary artery disease. Patent proximal LAD stent with mild in-stent restenosis. Moderate diffuse disease in the distal LAD. Significant stenosis in the proximal left circumflex at the origin of the posterior AV groove artery which is also heavily diseased at the ostium. This is a bifurcation lesion and heavily calcified. RCA stent is patent. However, there is significant proximal disease in the whole mid to distal segment is diffusely diseased and small caliber. 2. Left ventricular angiography was not performed. EF was severe reduced by echo. 3. Moderately elevated left ventricular end-diastolic pressure at 28 mmHg  Cardiac catheterization done 04/28/17 showed significant three-vessel disease with a patient stent in the RCA and LAD. Significant proximal RCS  disease and the stent as well as diffuse mid and distal disease. LAD has moderate disease. Severely elevated left ventricular end-diastolic pressure at 34 mmHg. Possible CABG in the future with optimizing medical management. Stress done in 2015.  Was in the ED 03/26/20 due to right shoulder pain where he was evaluated and released.   He presents today for a follow-up visit with a chief complaint of moderate shortness of breath upon minimal exertion. He describes this as chronic in nature having been present for several years. He has associated fatigue, cough, wheezing, intermittent chest pain, pedal edema (improving), light-headedness, abdominal distention (improving) and chronic pain along with this. He denies any difficulty sleeping, palpitations or weight gain.   Has been getting $RemoveBefo'80mg'bJwUUkcWjdb$  IV lasix/ 40meq PO potassium three times/ week for the last 2 weeks. Says that his swelling in his leg/ abdomen is improving although still has quite a bit of shortness of breath. Is taking $RemoveB'40mg'lKOMxMYd$  torsemide AM/ $RemoveBefor'20mg'wdVlsBDysoWV$  torsemide PM and continues salt tablet once/ day.   Past Medical History:  Diagnosis Date   Arthritis    Asthma    Atherosclerosis    BPH (benign prostatic hyperplasia)    Carotid arterial disease (HCC)    Charcot's joint of foot, right    Chronic combined systolic (congestive) and diastolic (congestive) heart failure ([place])    a. 02/2013 EF 50% by LV gram; b. 04/2017 Echo: EF 25-30%. diff HK. Gr2 DD. Mod dil LA/RV. PASP 72mmHg.   COPD (chronic obstructive pulmonary disease) ([place])  Coronary artery disease    a. 2013 S/P PCI of LAD [facility]);  b. 03/2013 PCI: RCA 90p ( 2.5 x 23 mm DES); c. 02/2014 Cath: patent RCA stent->Med Rx; d. 04/2017 Cath: LM nl, LAd 30ost/p, 53m/d, D1 80ost, LCX 90p/m, OM1 85, RCA 70/20p, 66m, 70d->Referred for CT Surg-felt to be poor candidate.   Diabetic neuropathy ([place])    Diabetic ulcer of right foot ([place])    Hernia    Hyperlipidemia    Hypertension    Ischemic  cardiomyopathy    a. 04/2017 Echo: EF 25-30%; b. 08/2017 Cardiac MRI ([facility]): EF 19%, sev glob HK. RVEF 20%, mod BAE, triv [name], mild-mod TR. Basal lateral subendocardial infarct (viable), inf/infsept, dist septal ischemia, basal to mid lat peri-infarct ischemia.   Morbid obesity ([place])    Neuropathy    PAD (peripheral artery disease) ([place])    a. Followed by Dr. Lucky Cowboy; b. 08/2010 Periph Angio: RSFA 70-80p (6X60 self-expanding stent); c. 09/2010 Periph Angio: L SFA 100p (7x unknown length self-expanding stent); d. 06/2015 Periph Angio: R SFA short segment occlusion (6x12 self-expanding stent).   Restless leg syndrome    Sleep apnea    Subclavian artery stenosis, right (HCC)    Syncope and collapse    Tobacco use    a. 75+ yr hx - still smoking 1ppd, down from 2 ppd.   Type II diabetes mellitus (HCC)    Varicose vein    Past Surgical History:  Procedure Laterality Date   ABDOMINAL AORTAGRAM N/A 06/23/2013   Procedure: ABDOMINAL AORTAGRAM;  Surgeon: Wellington Hampshire, MD;  Location: [place] CATH LAB;  Service: Cardiovascular;  Laterality: N/A;   AMPUTATION Right 04/01/2018   Procedure: AMPUTATION BELOW KNEE;  Surgeon: Algernon Huxley, MD;  Location: [facility] ORS;  Service: Vascular;  Laterality: Right;   APPENDECTOMY     CARDIAC CATHETERIZATION  10/14   [place]; X1 STENT PROXIMAL RCA   CARDIAC CATHETERIZATION  02/2010   [facility]   CARDIAC CATHETERIZATION  02/21/2014   [facility]   CLAVICLE SURGERY     HERNIA REPAIR     IRRIGATION AND DEBRIDEMENT FOOT Right 01/04/2018   Procedure: IRRIGATION AND DEBRIDEMENT FOOT;  Surgeon: Samara Deist, DPM;  Location: [facility] ORS;  Service: Podiatry;  Laterality: Right;   LEFT HEART CATH AND CORONARY ANGIOGRAPHY N/A 01/20/2020   Procedure: LEFT HEART CATH AND CORONARY ANGIOGRAPHY;  Surgeon: Wellington Hampshire, MD;  Location: [place] CV LAB;  Service: Cardiovascular;  Laterality: N/A;   LEFT HEART CATH AND CORS/GRAFTS ANGIOGRAPHY N/A 04/28/2017   Procedure: LEFT HEART  CATH AND CORONARY ANGIOGRAPHY;  Surgeon: Wellington Hampshire, MD;  Location: [place] CV LAB;  Service: Cardiovascular;  Laterality: N/A;   LOWER EXTREMITY ANGIOGRAPHY Right 03/03/2018   Procedure: LOWER EXTREMITY ANGIOGRAPHY;  Surgeon: Katha Cabal, MD;  Location: [place] CV LAB;  Service: Cardiovascular;  Laterality: Right;   LOWER EXTREMITY ANGIOGRAPHY Left 08/24/2018   Procedure: LOWER EXTREMITY ANGIOGRAPHY;  Surgeon: Algernon Huxley, MD;  Location: [place] CV LAB;  Service: Cardiovascular;  Laterality: Left;   LOWER EXTREMITY ANGIOGRAPHY Left 06/14/2019   Procedure: LOWER EXTREMITY ANGIOGRAPHY;  Surgeon: Algernon Huxley, MD;  Location: [place] CV LAB;  Service: Cardiovascular;  Laterality: Left;   LOWER EXTREMITY ANGIOGRAPHY Left 08/02/2019   Procedure: LOWER EXTREMITY ANGIOGRAPHY;  Surgeon: Algernon Huxley, MD;  Location: [place] CV LAB;  Service: Cardiovascular;  Laterality: Left;   PERIPHERAL ARTERIAL STENT GRAFT     x2 left/right  PERIPHERAL VASCULAR BALLOON ANGIOPLASTY Right 01/06/2018   Procedure: PERIPHERAL VASCULAR BALLOON ANGIOPLASTY;  Surgeon: Renford Dills, MD;  Location: [facility] IN[facility] CV [facility];  Service: Cardiovascular;  Laterality: Right;   PERIPHERAL VASCULAR CATHETERIZATION N/A 07/06/2015   Procedure: Abdominal Aortogram w/Lower Extremity;  Surgeon: Annice Needy, MD;  Location: [facility];  Service: Cardiovascular;  Laterality: N/A;   PERIPHERAL VASCULAR CATHETERIZATION  07/06/2015   Procedure: Lower Extremity Intervention;  Surgeon: Annice Needy, MD;  Location: [facility] IN[facility];  Service: Cardiovascular;;   Family History  Problem Relation Age of Onset   Heart attack Father    Heart disease Father    Hypertension Father    Hyperlipidemia Father    Hypertension Mother    Hyperlipidemia Mother    Social History   Tobacco Use   Smoking status: Current Every Day Smoker    Packs/day: 1.00    Years: 41.00    Pack years:  41.00    Types: Cigarettes   Smokeless tobacco: Never Used  Substance Use Topics   Alcohol use: No   Allergies  Allergen Reactions   Dulaglutide Anaphylaxis, Diarrhea and Hives   Other Itching    Skin itching associated with nitro patch   Prednisone Rash   Prior to Admission medications   Medication Sig Start Date End Date Taking? Authorizing Provider  albuterol (VENTOLIN HFA) 108 (90 Base) MCG/ACT inhaler Inhale into the lungs every 6 (six) hours as needed for wheezing or shortness of breath.   Yes [provider]  atorvastatin (LIPITOR) 80 MG tablet Take 80 mg by mouth daily.   Yes [provider]  budesonide-formoterol (SYMBICORT) 80-4.5 MCG/ACT inhaler Inhale 2 puffs into the lungs as needed.   Yes [provider]  clopidogrel (PLAVIX) 75 MG tablet Take 75 mg by mouth daily.  08/22/17  Yes [provider]  collagenase (SANTYL) ointment Apply 1 application topically daily. 08/11/19  Yes Georgiana Spinner, NP  doxycycline (VIBRAMYCIN) 100 MG capsule Take 1 capsule (100 mg total) by mouth 2 (two) times daily. 05/22/20  Yes Georgiana Spinner, NP  DULoxetine (CYMBALTA) 20 MG capsule Take 30 mg by mouth daily.    Yes [provider]  ELIQUIS 5 MG TABS tablet Take 5 mg by mouth daily.  02/02/20  Yes [provider]  empagliflozin (JARDIANCE) 10 MG TABS tablet Take 10 mg by mouth daily.  02/03/20  Yes [provider]  ezetimibe (ZETIA) 10 MG tablet Take 10 mg by mouth daily.    Yes [provider]  gabapentin (NEURONTIN) 800 MG tablet Take 800 mg by mouth 4 (four) times daily.  08/14/17 04/04/29 Yes [provider]  Ipratropium-Albuterol (COMBIVENT RESPIMAT) 20-100 MCG/ACT AERS respimat Inhale 1 puff into the lungs every 6 (six) hours as needed for wheezing.   Yes [provider]  ipratropium-albuterol (DUONEB) 0.5-2.5 (3) MG/3ML SOLN Inhale 3 mLs into the lungs every 6 (six) hours as needed (wheezing/sob).     Yes [provider]  isosorbide mononitrate (IMDUR) 30 MG 24 hr tablet Take 30 mg by mouth daily.   Yes [provider]  magnesium oxide (MAG-OX) 400 MG tablet Take 400 mg by mouth.  02/03/20  Yes [provider]  metFORMIN (GLUCOPHAGE-XR) 500 MG 24 hr tablet Take 1,000 mg by mouth 2 (two) times daily.    Yes [provider]  metoprolol succinate (TOPROL-XL) 25 MG 24 hr tablet Take 1 tablet (25 mg total) by mouth daily. 05/16/20  Yes Gollan, Kathlene November, MD  mometasone-formoterol (DULERA) 200-5 MCG/ACT AERO Inhale 2 puffs into the lungs as needed.    Yes [provider]  Multiple Vitamin (MULTI-VITAMINS) TABS Take 1 tablet by mouth daily.    Yes [provider]  mupirocin ointment (BACTROBAN) 2 % SMARTSIG:1 Application Topical 2-3 Times Daily 03/13/20  Yes [provider]  naloxone (NARCAN) 4 MG/0.1ML LIQD nasal spray kit Place into the nose as directed.  03/03/20  Yes [provider]  nitroGLYCERIN (NITROSTAT) 0.4 MG SL tablet Place 1 tablet (0.4 mg total) under the tongue every 5 (five) minutes as needed for chest pain. 05/19/19  Yes Saori Umholtz, Otila Kluver A, FNP  nystatin (MYCOSTATIN) 100000 UNIT/ML suspension Take 5 mLs by mouth 4 (four) times daily. 01/01/20  Yes [provider]  omeprazole (PRILOSEC) 40 MG capsule Take 40 mg by mouth daily.   Yes [provider]  Oxycodone HCl 10 MG TABS Take 10 mg by mouth every 6 (six) hours as needed (pain).  01/15/18  Yes [provider]  pantoprazole (PROTONIX) 40 MG tablet Take 40 mg by mouth daily. 02/02/20  Yes [provider]  potassium chloride (KLOR-CON) 10 MEQ tablet Take 20 mEq by mouth daily.   Yes [provider]  promethazine (PHENERGAN) 25 MG tablet Take 25 mg by mouth every 6 (six) hours as needed.  01/01/20  Yes [provider]  sodium chloride 1 g tablet Take 1 tablet (1 g total) by mouth daily. 04/19/20  Yes Alisa Graff, FNP   SYMBICORT 80-4.5 MCG/ACT inhaler Inhale 2 puffs into the lungs daily.  01/01/20  Yes [provider]  torsemide (DEMADEX) 20 MG tablet Take 2 tablets (40 mg total) by mouth 2 (two) times daily. Patient taking differently: Take 40 mg by mouth 2 (two) times daily. Taking $RemoveBefor'40mg'jjFjhttEvFGc$  AM/ $Re'20mg'qvB$  PM 05/16/20  Yes Gollan, Kathlene November, MD  traZODone (DESYREL) 150 MG tablet Take 150 mg by mouth at bedtime. 07/12/19  Yes [provider]  umeclidinium bromide (INCRUSE ELLIPTA) 62.5 MCG/INH AEPB Inhale 1 puff into the lungs daily.  02/03/20  Yes [provider]  Vericiguat 5 MG TABS Take 5 mg by mouth daily.   Yes [provider]     Review of Systems  Constitutional: Positive for fatigue. Negative for appetite change.  HENT: Positive for rhinorrhea. Negative for congestion, postnasal drip and sore throat.   Eyes: Negative.   Respiratory: Positive for cough, shortness of breath (unchanged) and wheezing. Negative for chest tightness.   Cardiovascular: Positive for chest pain (at times) and leg swelling (left lower leg, improving). Negative for palpitations.  Gastrointestinal: Positive for abdominal distention ("much better"). Negative for abdominal pain.  Endocrine: Negative.   Genitourinary: Negative.   Musculoskeletal: Positive for back pain. Negative for neck pain.       Has right lower leg prosthesis  Skin: Positive for wound (left shin).  Allergic/Immunologic: Negative.   Neurological: Positive for light-headedness (yesterday) and headaches. Negative for dizziness.  Hematological: Negative for adenopathy. Bruises/bleeds easily.  Psychiatric/Behavioral: Negative for dysphoric mood and sleep disturbance (sleeping on 2 pillows). The patient is not nervous/anxious.    Vitals:   05/30/20 1156  BP: 116/82  Pulse: (!) 121  Resp: 18  SpO2: 96%  Weight: 213 lb (96.6 kg)  Height: $Remove'5\' 7"'MMmMQdQ$  (1.702 m)   Wt Readings from Last 3 Encounters:  05/30/20 213 lb (96.6 kg)  05/30/20 213  lb (96.6 kg)  05/17/20 216 lb (98 kg)  Lab Results  Component Value Date   CREATININE 0.88 05/26/2020   CREATININE 0.95 05/24/2020   CREATININE 1.39 (H) 05/22/2020    Physical Exam Vitals and nursing note reviewed.  Constitutional:      Appearance: He is well-developed.  HENT:     Head: Normocephalic and atraumatic.  Neck:     Vascular: No JVD.  Cardiovascular:     Rate and Rhythm: Regular rhythm. Tachycardia present.  Pulmonary:     Effort: Pulmonary effort is normal.     Breath sounds: No wheezing, rhonchi or rales.  Abdominal:     General: There is no distension.     Palpations: Abdomen is soft.     Tenderness: There is no abdominal tenderness.  Musculoskeletal:        General: Deformity (right leg prosthesis present) present. No tenderness.     Cervical back: Normal range of motion and neck supple.     Left lower leg: No tenderness. Edema (1+ pitting) present.  Skin:    General: Skin is warm and dry.     Findings: No erythema.  Neurological:     Mental Status: He is alert and oriented to person, place, and time.  Psychiatric:        Behavior: Behavior normal.        Thought Content: Thought content normal.     Assessment & Plan:  1: Chronic heart failure with reduced ejection fraction- - NYHA class III - minimally fluid overloaded today with edema (although less) but still symptomatic with shortness of breath - not weighing daily; instructed to resume and to call for an overnight weight gain of >2 pounds or a weekly weight gain of >5 pounds - weight down 10 pounds from last visit 3 weeks ago - saw cardiology Rockey Situ) 05/16/20 - palliative care consult completed 05/29/20 - BNP 05/26/20 was 2910.2 - participating in paramedicine program although she's had a hard time reaching him; discussed again w/patient and he says that he's still interested so paramedic was informed - not interested in COVID vaccine at this time - will continue IV lasix 3 times/ week as long  as his labs are stable; discussed that at next visit, consider decreasing IV lasix frequency and possibly using metolazone with additional potassium   2: HTN- - BP looks good today - saw PCP Dr. 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 Each medication was verbally reviewed with the patient and he was encouraged to bring the bottles to every visit to confirm accuracy of list.   Return in 1 month or sooner for any questions/problems before then.

## 2020-05-30 ENCOUNTER — Ambulatory Visit: Payer: Medicare Other | Admitting: Family

## 2020-05-30 ENCOUNTER — Ambulatory Visit (INDEPENDENT_AMBULATORY_CARE_PROVIDER_SITE_OTHER): Payer: Medicare Other | Admitting: Nurse Practitioner

## 2020-05-30 ENCOUNTER — Other Ambulatory Visit: Payer: Self-pay

## 2020-05-30 ENCOUNTER — Encounter: Payer: Self-pay | Admitting: Family

## 2020-05-30 ENCOUNTER — Ambulatory Visit
Admission: RE | Admit: 2020-05-30 | Discharge: 2020-05-30 | Disposition: A | Discharge status: Home / Self Care | Payer: Medicare Other | Source: Ambulatory Visit | Attending: Family | Admitting: Family

## 2020-05-30 VITALS — BP 121/74 | HR 137 | Ht 67.0 in | Wt 213.0 lb

## 2020-05-30 VITALS — BP 116/82 | HR 121 | Resp 18 | Ht 67.0 in | Wt 213.0 lb

## 2020-05-30 DIAGNOSIS — E1159 Type 2 diabetes mellitus with other circulatory complications: Secondary | ICD-10-CM

## 2020-05-30 DIAGNOSIS — E1151 Type 2 diabetes mellitus with diabetic peripheral angiopathy without gangrene: Secondary | ICD-10-CM | POA: Insufficient documentation

## 2020-05-30 DIAGNOSIS — I2510 Atherosclerotic heart disease of native coronary artery without angina pectoris: Secondary | ICD-10-CM | POA: Insufficient documentation

## 2020-05-30 DIAGNOSIS — Z89511 Acquired absence of right leg below knee: Secondary | ICD-10-CM | POA: Insufficient documentation

## 2020-05-30 DIAGNOSIS — R140 Abdominal distension (gaseous): Secondary | ICD-10-CM | POA: Insufficient documentation

## 2020-05-30 DIAGNOSIS — E871 Hypo-osmolality and hyponatremia: Secondary | ICD-10-CM | POA: Insufficient documentation

## 2020-05-30 DIAGNOSIS — R059 Cough, unspecified: Secondary | ICD-10-CM | POA: Insufficient documentation

## 2020-05-30 DIAGNOSIS — E785 Hyperlipidemia, unspecified: Secondary | ICD-10-CM | POA: Insufficient documentation

## 2020-05-30 DIAGNOSIS — G8929 Other chronic pain: Secondary | ICD-10-CM | POA: Insufficient documentation

## 2020-05-30 DIAGNOSIS — Z720 Tobacco use: Secondary | ICD-10-CM

## 2020-05-30 DIAGNOSIS — I5022 Chronic systolic (congestive) heart failure: Secondary | ICD-10-CM

## 2020-05-30 DIAGNOSIS — J449 Chronic obstructive pulmonary disease, unspecified: Secondary | ICD-10-CM | POA: Insufficient documentation

## 2020-05-30 DIAGNOSIS — Z7901 Long term (current) use of anticoagulants: Secondary | ICD-10-CM | POA: Insufficient documentation

## 2020-05-30 DIAGNOSIS — L97929 Non-pressure chronic ulcer of unspecified part of left lower leg with unspecified severity: Secondary | ICD-10-CM

## 2020-05-30 DIAGNOSIS — I110 Hypertensive heart disease with heart failure: Secondary | ICD-10-CM | POA: Insufficient documentation

## 2020-05-30 DIAGNOSIS — R42 Dizziness and giddiness: Secondary | ICD-10-CM | POA: Insufficient documentation

## 2020-05-30 DIAGNOSIS — I70299 Other atherosclerosis of native arteries of extremities, unspecified extremity: Secondary | ICD-10-CM

## 2020-05-30 DIAGNOSIS — I83029 Varicose veins of left lower extremity with ulcer of unspecified site: Secondary | ICD-10-CM | POA: Diagnosis not present

## 2020-05-30 DIAGNOSIS — I5042 Chronic combined systolic (congestive) and diastolic (congestive) heart failure: Secondary | ICD-10-CM | POA: Insufficient documentation

## 2020-05-30 DIAGNOSIS — R0789 Other chest pain: Secondary | ICD-10-CM | POA: Insufficient documentation

## 2020-05-30 DIAGNOSIS — Z955 Presence of coronary angioplasty implant and graft: Secondary | ICD-10-CM | POA: Insufficient documentation

## 2020-05-30 DIAGNOSIS — I10 Essential (primary) hypertension: Secondary | ICD-10-CM

## 2020-05-30 DIAGNOSIS — Z8249 Family history of ischemic heart disease and other diseases of the circulatory system: Secondary | ICD-10-CM | POA: Insufficient documentation

## 2020-05-30 DIAGNOSIS — Z7902 Long term (current) use of antithrombotics/antiplatelets: Secondary | ICD-10-CM | POA: Insufficient documentation

## 2020-05-30 DIAGNOSIS — Z79899 Other long term (current) drug therapy: Secondary | ICD-10-CM | POA: Insufficient documentation

## 2020-05-30 DIAGNOSIS — F17210 Nicotine dependence, cigarettes, uncomplicated: Secondary | ICD-10-CM | POA: Insufficient documentation

## 2020-05-30 DIAGNOSIS — Z7984 Long term (current) use of oral hypoglycemic drugs: Secondary | ICD-10-CM | POA: Insufficient documentation

## 2020-05-30 DIAGNOSIS — L97909 Non-pressure chronic ulcer of unspecified part of unspecified lower leg with unspecified severity: Secondary | ICD-10-CM

## 2020-05-30 MED ORDER — FUROSEMIDE 10 MG/ML IJ SOLN
INTRAMUSCULAR | Status: AC
  Administered 2020-05-30: 80 mg via INTRAVENOUS
  Filled 2020-05-30: qty 8

## 2020-05-30 MED ORDER — FUROSEMIDE 10 MG/ML IJ SOLN: 80.0000 mg | Freq: Once | INTRAMUSCULAR | Status: AC

## 2020-05-30 MED ORDER — POTASSIUM CHLORIDE CRYS ER 20 MEQ PO TBCR: 40.0000 meq | EXTENDED_RELEASE_TABLET | Freq: Once | ORAL | Status: AC

## 2020-05-30 MED ORDER — POTASSIUM CHLORIDE CRYS ER 20 MEQ PO TBCR
EXTENDED_RELEASE_TABLET | ORAL | Status: AC
  Administered 2020-05-30: 40 meq via ORAL
  Filled 2020-05-30: qty 2

## 2020-05-30 NOTE — Progress Notes (Signed)
History of Present Illness  There is no documented history at this time  Assessments & Plan   There are no diagnoses linked to this encounter.    Additional instructions  Subjective:  Patient presents with venous ulcer of the Left lower extremity.    Procedure:  3 layer unna wrap was placed Left lower extremity.   Plan:   Follow up in one week.  

## 2020-05-30 NOTE — Patient Instructions (Signed)
Continue weighing daily and call for an overnight weight gain of > 2 pounds or a weekly weight gain of >5 pounds. 

## 2020-05-31 ENCOUNTER — Ambulatory Visit
Admission: RE | Admit: 2020-05-31 | Discharge: 2020-05-31 | Disposition: A | Discharge status: Home / Self Care | Payer: Medicare Other | Source: Ambulatory Visit | Attending: Family | Admitting: Family

## 2020-05-31 ENCOUNTER — Ambulatory Visit: Payer: Medicare Other

## 2020-05-31 DIAGNOSIS — E1159 Type 2 diabetes mellitus with other circulatory complications: Secondary | ICD-10-CM | POA: Diagnosis not present

## 2020-05-31 DIAGNOSIS — I5022 Chronic systolic (congestive) heart failure: Secondary | ICD-10-CM | POA: Diagnosis present

## 2020-05-31 DIAGNOSIS — E871 Hypo-osmolality and hyponatremia: Secondary | ICD-10-CM | POA: Diagnosis not present

## 2020-05-31 DIAGNOSIS — I110 Hypertensive heart disease with heart failure: Secondary | ICD-10-CM | POA: Insufficient documentation

## 2020-05-31 LAB — BASIC METABOLIC PANEL
Anion gap: 10 (ref 5–15)
BUN: 18 mg/dL (ref 6–20)
CO2: 29 mmol/L (ref 22–32)
Calcium: 9.4 mg/dL (ref 8.9–10.3)
Chloride: 92 mmol/L — ABNORMAL LOW (ref 98–111)
Creatinine, Ser: 1.11 mg/dL (ref 0.61–1.24)
GFR, Estimated: >60 mL/min (ref >60)
Glucose, Bld: 139 mg/dL — ABNORMAL HIGH (ref 70–99)
Potassium: 3.7 mmol/L (ref 3.5–5.1)
Sodium: 131 mmol/L — ABNORMAL LOW (ref 135–145)

## 2020-05-31 LAB — BRAIN NATRIURETIC PEPTIDE: B Natriuretic Peptide: 2081.1 pg/mL — ABNORMAL HIGH (ref 0.0–100.0)

## 2020-05-31 MED ORDER — POTASSIUM CHLORIDE CRYS ER 20 MEQ PO TBCR
40.0000 meq | EXTENDED_RELEASE_TABLET | Freq: Once | ORAL | Status: AC
  Administered 2020-05-31: 40 meq via ORAL

## 2020-05-31 MED ORDER — FUROSEMIDE 10 MG/ML IJ SOLN
80.0000 mg | Freq: Once | INTRAMUSCULAR | Status: AC
  Administered 2020-05-31: 80 mg via INTRAVENOUS

## 2020-06-01 ENCOUNTER — Other Ambulatory Visit: Payer: Self-pay

## 2020-06-01 ENCOUNTER — Ambulatory Visit
Admission: RE | Admit: 2020-06-01 | Discharge: 2020-06-01 | Disposition: A | Discharge status: Home / Self Care | Payer: Medicare Other | Source: Ambulatory Visit | Attending: Family | Admitting: Family

## 2020-06-01 DIAGNOSIS — I5022 Chronic systolic (congestive) heart failure: Secondary | ICD-10-CM | POA: Insufficient documentation

## 2020-06-01 LAB — BASIC METABOLIC PANEL
Anion gap: 10 (ref 5–15)
BUN: 16 mg/dL (ref 6–20)
CO2: 27 mmol/L (ref 22–32)
Calcium: 9 mg/dL (ref 8.9–10.3)
Chloride: 92 mmol/L — ABNORMAL LOW (ref 98–111)
Creatinine, Ser: 0.98 mg/dL (ref 0.61–1.24)
GFR, Estimated: >60 mL/min (ref >60)
Glucose, Bld: 145 mg/dL — ABNORMAL HIGH (ref 70–99)
Potassium: 4.6 mmol/L (ref 3.5–5.1)
Sodium: 129 mmol/L — ABNORMAL LOW (ref 135–145)

## 2020-06-01 LAB — BRAIN NATRIURETIC PEPTIDE: B Natriuretic Peptide: 1602.3 pg/mL — ABNORMAL HIGH (ref 0.0–100.0)

## 2020-06-01 MED ORDER — POTASSIUM CHLORIDE CRYS ER 20 MEQ PO TBCR
EXTENDED_RELEASE_TABLET | ORAL | Status: AC
  Filled 2020-06-01: qty 2

## 2020-06-01 MED ORDER — FUROSEMIDE 10 MG/ML IJ SOLN: 80.0000 mg | Freq: Once | INTRAMUSCULAR | Status: AC

## 2020-06-01 MED ORDER — FUROSEMIDE 10 MG/ML IJ SOLN
INTRAMUSCULAR | Status: AC
  Administered 2020-06-01: 80 mg via INTRAVENOUS
  Filled 2020-06-01: qty 8

## 2020-06-01 MED ORDER — POTASSIUM CHLORIDE 20 MEQ PO PACK
40.0000 meq | PACK | Freq: Once | ORAL | Status: AC
  Administered 2020-06-01: 40 meq via ORAL
  Filled 2020-06-01: qty 2

## 2020-06-05 ENCOUNTER — Encounter (INDEPENDENT_AMBULATORY_CARE_PROVIDER_SITE_OTHER): Payer: Self-pay

## 2020-06-05 ENCOUNTER — Encounter (INDEPENDENT_AMBULATORY_CARE_PROVIDER_SITE_OTHER): Payer: Medicare Other

## 2020-06-08 ENCOUNTER — Ambulatory Visit
Admission: RE | Admit: 2020-06-08 | Discharge: 2020-06-08 | Disposition: A | Discharge status: Home / Self Care | Payer: Medicare Other | Source: Ambulatory Visit | Attending: Family | Admitting: Family

## 2020-06-08 ENCOUNTER — Encounter (HOSPITAL_COMMUNITY): Payer: Self-pay

## 2020-06-08 ENCOUNTER — Other Ambulatory Visit: Payer: Self-pay

## 2020-06-08 ENCOUNTER — Encounter (INDEPENDENT_AMBULATORY_CARE_PROVIDER_SITE_OTHER): Payer: Self-pay | Admitting: Nurse Practitioner

## 2020-06-08 ENCOUNTER — Ambulatory Visit (INDEPENDENT_AMBULATORY_CARE_PROVIDER_SITE_OTHER): Payer: Medicare Other | Admitting: Nurse Practitioner

## 2020-06-08 ENCOUNTER — Other Ambulatory Visit: Payer: Self-pay | Admitting: *Deleted

## 2020-06-08 DIAGNOSIS — I83029 Varicose veins of left lower extremity with ulcer of unspecified site: Secondary | ICD-10-CM | POA: Diagnosis not present

## 2020-06-08 DIAGNOSIS — L97929 Non-pressure chronic ulcer of unspecified part of left lower leg with unspecified severity: Secondary | ICD-10-CM

## 2020-06-08 DIAGNOSIS — I5022 Chronic systolic (congestive) heart failure: Secondary | ICD-10-CM | POA: Diagnosis present

## 2020-06-08 LAB — BASIC METABOLIC PANEL
Anion gap: 10 (ref 5–15)
BUN: 20 mg/dL (ref 6–20)
CO2: 25 mmol/L (ref 22–32)
Calcium: 9 mg/dL (ref 8.9–10.3)
Chloride: 88 mmol/L — ABNORMAL LOW (ref 98–111)
Creatinine, Ser: 1.09 mg/dL (ref 0.61–1.24)
GFR, Estimated: >60 mL/min (ref >60)
Glucose, Bld: 123 mg/dL — ABNORMAL HIGH (ref 70–99)
Potassium: 4.5 mmol/L (ref 3.5–5.1)
Sodium: 123 mmol/L — ABNORMAL LOW (ref 135–145)

## 2020-06-08 LAB — BRAIN NATRIURETIC PEPTIDE: B Natriuretic Peptide: 1577.2 pg/mL — ABNORMAL HIGH (ref 0.0–100.0)

## 2020-06-08 MED ORDER — POTASSIUM CHLORIDE CRYS ER 20 MEQ PO TBCR: 40.0000 meq | EXTENDED_RELEASE_TABLET | Freq: Once | ORAL | Status: AC

## 2020-06-08 MED ORDER — FUROSEMIDE 10 MG/ML IJ SOLN: 80.0000 mg | Freq: Once | INTRAMUSCULAR | Status: AC

## 2020-06-08 MED ORDER — POTASSIUM CHLORIDE CRYS ER 20 MEQ PO TBCR
EXTENDED_RELEASE_TABLET | ORAL | Status: AC
  Administered 2020-06-08: 40 meq via ORAL
  Filled 2020-06-08: qty 2

## 2020-06-08 MED ORDER — FUROSEMIDE 10 MG/ML IJ SOLN
INTRAMUSCULAR | Status: AC
  Administered 2020-06-08: 80 mg via INTRAVENOUS
  Filled 2020-06-08: qty 8

## 2020-06-08 NOTE — Progress Notes (Signed)
History of Present Illness  There is no documented history at this time  Assessments & Plan   There are no diagnoses linked to this encounter.    Additional instructions  Subjective:  Patient presents with venous ulcer of the Left lower extremity.    Procedure:  3 layer unna wrap was placed Left lower extremity.   Plan:   Follow up in one week.  

## 2020-06-12 ENCOUNTER — Ambulatory Visit (INDEPENDENT_AMBULATORY_CARE_PROVIDER_SITE_OTHER): Payer: Medicare Other | Admitting: Nurse Practitioner

## 2020-06-12 ENCOUNTER — Other Ambulatory Visit: Payer: Self-pay

## 2020-06-12 ENCOUNTER — Telehealth: Payer: Self-pay | Admitting: Family

## 2020-06-12 ENCOUNTER — Ambulatory Visit
Admission: RE | Admit: 2020-06-12 | Discharge: 2020-06-12 | Disposition: A | Discharge status: Home / Self Care | Payer: Medicare Other | Source: Ambulatory Visit | Attending: Family | Admitting: Family

## 2020-06-12 DIAGNOSIS — I5022 Chronic systolic (congestive) heart failure: Secondary | ICD-10-CM | POA: Diagnosis not present

## 2020-06-12 LAB — BASIC METABOLIC PANEL
Anion gap: 8 (ref 5–15)
BUN: 20 mg/dL (ref 6–20)
CO2: 29 mmol/L (ref 22–32)
Calcium: 9 mg/dL (ref 8.9–10.3)
Chloride: 89 mmol/L — ABNORMAL LOW (ref 98–111)
Creatinine, Ser: 1.13 mg/dL (ref 0.61–1.24)
GFR, Estimated: >60 mL/min (ref >60)
Glucose, Bld: 115 mg/dL — ABNORMAL HIGH (ref 70–99)
Potassium: 4.6 mmol/L (ref 3.5–5.1)
Sodium: 126 mmol/L — ABNORMAL LOW (ref 135–145)

## 2020-06-12 LAB — BRAIN NATRIURETIC PEPTIDE: B Natriuretic Peptide: 1985.3 pg/mL — ABNORMAL HIGH (ref 0.0–100.0)

## 2020-06-12 MED ORDER — POTASSIUM CHLORIDE CRYS ER 20 MEQ PO TBCR: 40.0000 meq | EXTENDED_RELEASE_TABLET | Freq: Once | ORAL | Status: AC

## 2020-06-12 MED ORDER — POTASSIUM CHLORIDE CRYS ER 20 MEQ PO TBCR: 30.0000 meq | EXTENDED_RELEASE_TABLET | Freq: Once | ORAL | Status: DC

## 2020-06-12 MED ORDER — FUROSEMIDE 10 MG/ML IJ SOLN
INTRAMUSCULAR | Status: AC
  Administered 2020-06-12: 80 mg via INTRAVENOUS
  Filled 2020-06-12: qty 8

## 2020-06-12 MED ORDER — POTASSIUM CHLORIDE CRYS ER 20 MEQ PO TBCR
EXTENDED_RELEASE_TABLET | ORAL | Status: AC
  Administered 2020-06-12: 40 meq via ORAL
  Filled 2020-06-12: qty 2

## 2020-06-12 MED ORDER — SODIUM CHLORIDE 1 G PO TABS: 1.0000 g | ORAL_TABLET | Freq: Three times a day (TID) | ORAL | 3 refills | Status: DC

## 2020-06-12 MED ORDER — FUROSEMIDE 10 MG/ML IJ SOLN: 80.0000 mg | Freq: Once | INTRAMUSCULAR | Status: AC

## 2020-06-12 NOTE — Discharge Instructions (Signed)
Keep appointment with Heart failure as scheduled.  If any worsening Shortness of breath contact the clinic or go to the ED.

## 2020-06-12 NOTE — Telephone Encounter (Signed)
Called patient to review BMP results from 06/08/20. Potassium and renal function look good although sodium level has declined to 123. Patient has continued to take 1 gram sodium chloride tablet daily but has still needed IV lasix numerous times/ week.   Advised patient to increase his sodium chloride tablets to 3 tablets daily in the hopes we can continue IV lasix a little bit longer. Patient verbalized understanding and a new RX was sent to [facility]

## 2020-06-13 ENCOUNTER — Ambulatory Visit (INDEPENDENT_AMBULATORY_CARE_PROVIDER_SITE_OTHER): Payer: Medicare Other

## 2020-06-14 ENCOUNTER — Ambulatory Visit
Admission: RE | Admit: 2020-06-14 | Discharge: 2020-06-14 | Disposition: A | Discharge status: Home / Self Care | Payer: Medicare Other | Source: Ambulatory Visit | Attending: Family | Admitting: Family

## 2020-06-14 ENCOUNTER — Other Ambulatory Visit: Payer: Self-pay

## 2020-06-14 DIAGNOSIS — I5022 Chronic systolic (congestive) heart failure: Secondary | ICD-10-CM | POA: Insufficient documentation

## 2020-06-14 DIAGNOSIS — I110 Hypertensive heart disease with heart failure: Secondary | ICD-10-CM | POA: Diagnosis not present

## 2020-06-14 DIAGNOSIS — R0602 Shortness of breath: Secondary | ICD-10-CM | POA: Diagnosis not present

## 2020-06-14 LAB — BASIC METABOLIC PANEL
Anion gap: 8 (ref 5–15)
BUN: 24 mg/dL — ABNORMAL HIGH (ref 6–20)
CO2: 27 mmol/L (ref 22–32)
Calcium: 9 mg/dL (ref 8.9–10.3)
Chloride: 89 mmol/L — ABNORMAL LOW (ref 98–111)
Creatinine, Ser: 1.1 mg/dL (ref 0.61–1.24)
GFR, Estimated: >60 mL/min (ref >60)
Glucose, Bld: 113 mg/dL — ABNORMAL HIGH (ref 70–99)
Potassium: 4.6 mmol/L (ref 3.5–5.1)
Sodium: 124 mmol/L — ABNORMAL LOW (ref 135–145)

## 2020-06-14 LAB — BRAIN NATRIURETIC PEPTIDE: B Natriuretic Peptide: 2062.1 pg/mL — ABNORMAL HIGH (ref 0.0–100.0)

## 2020-06-14 MED ORDER — FUROSEMIDE 10 MG/ML IJ SOLN
INTRAMUSCULAR | Status: AC
  Administered 2020-06-14: 80 mg via INTRAVENOUS
  Filled 2020-06-14: qty 8

## 2020-06-14 MED ORDER — POTASSIUM CHLORIDE CRYS ER 20 MEQ PO TBCR: 40.0000 meq | EXTENDED_RELEASE_TABLET | Freq: Once | ORAL | Status: AC

## 2020-06-14 MED ORDER — FUROSEMIDE 10 MG/ML IJ SOLN: 80.0000 mg | Freq: Once | INTRAMUSCULAR | Status: AC

## 2020-06-14 MED ORDER — POTASSIUM CHLORIDE CRYS ER 20 MEQ PO TBCR
EXTENDED_RELEASE_TABLET | ORAL | Status: AC
  Administered 2020-06-14: 40 meq via ORAL
  Filled 2020-06-14: qty 2

## 2020-06-15 ENCOUNTER — Telehealth: Payer: Self-pay | Admitting: Family

## 2020-06-15 MED ORDER — METOLAZONE 5 MG PO TABS: 5.0000 mg | ORAL_TABLET | Freq: Every day | ORAL | 0 refills | Status: DC

## 2020-06-15 NOTE — Telephone Encounter (Signed)
Spoke with patient after consulting with Dr Rockey Situ regarding patient's labs and symptoms. Patient says that he's feeling very weak and short of breath. Also says that his swelling is "about the same". Hasn't been weighing himself because of the weakness.   Was getting IV lasix 3 times/ week but with Christmas/ [place] holiday, he didn't receive it as much. Palliative care has visited with him once.   He confirms that he's taking the sodium chloride tablets as 3/ day and is trying to not to drink too much fluids.   Discussed possible ED visit (which patient doesn't want to do) versus getting IV lasix back on schedule along with some metolazone today and then over the weekend. He received IV lasix yesterday and is scheduled again tomorrow.   Dr. Rockey Situ suggests metolazone 5mg  today, IV lasix tomorrow, metolazone 5mg  Sat & Sun and then back on IV lasix 3 days/ week beginning Monday January 13th. He is to take an additional potassium tablet on the days that he takes metolazone.   Patient is in agreement with this plan and prefers to try this. Did emphasize to patient that should his symptoms worsen, that he needs to go to the ED but prepared to wait.

## 2020-06-16 ENCOUNTER — Telehealth: Payer: Self-pay | Admitting: Family

## 2020-06-16 ENCOUNTER — Ambulatory Visit: Admission: RE | Admit: 2020-06-16 | Payer: Medicare Other | Source: Ambulatory Visit

## 2020-06-16 DIAGNOSIS — M1990 Unspecified osteoarthritis, unspecified site: Secondary | ICD-10-CM | POA: Diagnosis present

## 2020-06-16 DIAGNOSIS — G4733 Obstructive sleep apnea (adult) (pediatric): Secondary | ICD-10-CM | POA: Diagnosis present

## 2020-06-16 DIAGNOSIS — E785 Hyperlipidemia, unspecified: Secondary | ICD-10-CM | POA: Diagnosis present

## 2020-06-16 DIAGNOSIS — I428 Other cardiomyopathies: Secondary | ICD-10-CM | POA: Diagnosis present

## 2020-06-16 DIAGNOSIS — I255 Ischemic cardiomyopathy: Secondary | ICD-10-CM | POA: Diagnosis present

## 2020-06-16 DIAGNOSIS — E1151 Type 2 diabetes mellitus with diabetic peripheral angiopathy without gangrene: Secondary | ICD-10-CM | POA: Diagnosis present

## 2020-06-16 DIAGNOSIS — F17210 Nicotine dependence, cigarettes, uncomplicated: Secondary | ICD-10-CM | POA: Diagnosis present

## 2020-06-16 DIAGNOSIS — I493 Ventricular premature depolarization: Secondary | ICD-10-CM | POA: Diagnosis present

## 2020-06-16 DIAGNOSIS — Z7901 Long term (current) use of anticoagulants: Secondary | ICD-10-CM

## 2020-06-16 DIAGNOSIS — J45901 Unspecified asthma with (acute) exacerbation: Secondary | ICD-10-CM | POA: Diagnosis present

## 2020-06-16 DIAGNOSIS — J449 Chronic obstructive pulmonary disease, unspecified: Secondary | ICD-10-CM | POA: Diagnosis present

## 2020-06-16 DIAGNOSIS — E1169 Type 2 diabetes mellitus with other specified complication: Secondary | ICD-10-CM | POA: Diagnosis present

## 2020-06-16 DIAGNOSIS — Z7984 Long term (current) use of oral hypoglycemic drugs: Secondary | ICD-10-CM

## 2020-06-16 DIAGNOSIS — N400 Enlarged prostate without lower urinary tract symptoms: Secondary | ICD-10-CM | POA: Diagnosis present

## 2020-06-16 DIAGNOSIS — Z9119 Patient's noncompliance with other medical treatment and regimen: Secondary | ICD-10-CM

## 2020-06-16 DIAGNOSIS — G2581 Restless legs syndrome: Secondary | ICD-10-CM | POA: Diagnosis present

## 2020-06-16 DIAGNOSIS — Z888 Allergy status to other drugs, medicaments and biological substances status: Secondary | ICD-10-CM

## 2020-06-16 DIAGNOSIS — I5082 Biventricular heart failure: Secondary | ICD-10-CM | POA: Diagnosis present

## 2020-06-16 DIAGNOSIS — Z7902 Long term (current) use of antithrombotics/antiplatelets: Secondary | ICD-10-CM

## 2020-06-16 DIAGNOSIS — E1140 Type 2 diabetes mellitus with diabetic neuropathy, unspecified: Secondary | ICD-10-CM | POA: Diagnosis present

## 2020-06-16 DIAGNOSIS — N179 Acute kidney failure, unspecified: Secondary | ICD-10-CM | POA: Diagnosis present

## 2020-06-16 DIAGNOSIS — I472 Ventricular tachycardia: Secondary | ICD-10-CM | POA: Diagnosis not present

## 2020-06-16 DIAGNOSIS — I4819 Other persistent atrial fibrillation: Secondary | ICD-10-CM | POA: Diagnosis present

## 2020-06-16 DIAGNOSIS — I110 Hypertensive heart disease with heart failure: Principal | ICD-10-CM | POA: Diagnosis present

## 2020-06-16 DIAGNOSIS — Z89511 Acquired absence of right leg below knee: Secondary | ICD-10-CM

## 2020-06-16 DIAGNOSIS — Z20822 Contact with and (suspected) exposure to covid-19: Secondary | ICD-10-CM | POA: Diagnosis present

## 2020-06-16 DIAGNOSIS — Z9114 Patient's other noncompliance with medication regimen: Secondary | ICD-10-CM

## 2020-06-16 DIAGNOSIS — I25118 Atherosclerotic heart disease of native coronary artery with other forms of angina pectoris: Secondary | ICD-10-CM | POA: Diagnosis present

## 2020-06-16 DIAGNOSIS — Z955 Presence of coronary angioplasty implant and graft: Secondary | ICD-10-CM

## 2020-06-16 DIAGNOSIS — Z66 Do not resuscitate: Secondary | ICD-10-CM | POA: Diagnosis not present

## 2020-06-16 DIAGNOSIS — I5043 Acute on chronic combined systolic (congestive) and diastolic (congestive) heart failure: Secondary | ICD-10-CM | POA: Diagnosis present

## 2020-06-16 DIAGNOSIS — Z79899 Other long term (current) drug therapy: Secondary | ICD-10-CM

## 2020-06-16 DIAGNOSIS — Z7952 Long term (current) use of systemic steroids: Secondary | ICD-10-CM

## 2020-06-16 DIAGNOSIS — J441 Chronic obstructive pulmonary disease with (acute) exacerbation: Secondary | ICD-10-CM | POA: Diagnosis present

## 2020-06-16 DIAGNOSIS — J9621 Acute and chronic respiratory failure with hypoxia: Secondary | ICD-10-CM | POA: Diagnosis present

## 2020-06-16 DIAGNOSIS — Z6835 Body mass index (BMI) 35.0-35.9, adult: Secondary | ICD-10-CM

## 2020-06-16 DIAGNOSIS — I272 Pulmonary hypertension, unspecified: Secondary | ICD-10-CM | POA: Diagnosis present

## 2020-06-16 NOTE — Telephone Encounter (Addendum)
Emphasized that if patient felt too weak to get to his appointment that he needed to be evaluated in the ED. Patient doesn't want to go to the ED but most recent sodium level was 124 two days ago. Patient's significant other verbalized understanding.

## 2020-06-16 NOTE — Telephone Encounter (Addendum)
Patients Spouse called to notify us that Dontee would not be able to make it to his IV lasix today because he was to weak to get up. I advised patient he needs to call an ambulance or be taking to the ER.   Jaylyn Iyer, NT

## 2020-06-17 ENCOUNTER — Emergency Department: Payer: Medicare Other

## 2020-06-17 ENCOUNTER — Inpatient Hospital Stay (HOSPITAL_COMMUNITY)
Admit: 2020-06-17 | Discharge: 2020-06-17 | Disposition: A | Discharge status: Home / Self Care | Payer: Medicare Other | Attending: Internal Medicine | Admitting: Internal Medicine

## 2020-06-17 ENCOUNTER — Other Ambulatory Visit: Payer: Self-pay

## 2020-06-17 ENCOUNTER — Inpatient Hospital Stay
Admission: EM | Admit: 2020-06-17 | Discharge: 2020-07-01 | DRG: 291 | Disposition: A | Discharge status: Home Health | Payer: Medicare Other | Attending: Family Medicine | Admitting: Family Medicine

## 2020-06-17 DIAGNOSIS — E785 Hyperlipidemia, unspecified: Secondary | ICD-10-CM | POA: Diagnosis not present

## 2020-06-17 DIAGNOSIS — J449 Chronic obstructive pulmonary disease, unspecified: Secondary | ICD-10-CM | POA: Diagnosis present

## 2020-06-17 DIAGNOSIS — G2581 Restless legs syndrome: Secondary | ICD-10-CM | POA: Diagnosis present

## 2020-06-17 DIAGNOSIS — Z515 Encounter for palliative care: Secondary | ICD-10-CM

## 2020-06-17 DIAGNOSIS — E871 Hypo-osmolality and hyponatremia: Secondary | ICD-10-CM

## 2020-06-17 DIAGNOSIS — I255 Ischemic cardiomyopathy: Secondary | ICD-10-CM | POA: Diagnosis present

## 2020-06-17 DIAGNOSIS — R079 Chest pain, unspecified: Secondary | ICD-10-CM

## 2020-06-17 DIAGNOSIS — I5021 Acute systolic (congestive) heart failure: Secondary | ICD-10-CM

## 2020-06-17 DIAGNOSIS — I5043 Acute on chronic combined systolic (congestive) and diastolic (congestive) heart failure: Secondary | ICD-10-CM | POA: Diagnosis not present

## 2020-06-17 DIAGNOSIS — J810 Acute pulmonary edema: Secondary | ICD-10-CM | POA: Diagnosis not present

## 2020-06-17 DIAGNOSIS — J432 Centrilobular emphysema: Secondary | ICD-10-CM | POA: Diagnosis not present

## 2020-06-17 DIAGNOSIS — I5023 Acute on chronic systolic (congestive) heart failure: Secondary | ICD-10-CM | POA: Diagnosis not present

## 2020-06-17 DIAGNOSIS — R0602 Shortness of breath: Secondary | ICD-10-CM

## 2020-06-17 DIAGNOSIS — Z20822 Contact with and (suspected) exposure to covid-19: Secondary | ICD-10-CM | POA: Diagnosis present

## 2020-06-17 DIAGNOSIS — Z9114 Patient's other noncompliance with medication regimen: Secondary | ICD-10-CM | POA: Diagnosis not present

## 2020-06-17 DIAGNOSIS — I5033 Acute on chronic diastolic (congestive) heart failure: Secondary | ICD-10-CM | POA: Diagnosis not present

## 2020-06-17 DIAGNOSIS — E1169 Type 2 diabetes mellitus with other specified complication: Secondary | ICD-10-CM | POA: Diagnosis present

## 2020-06-17 DIAGNOSIS — I4819 Other persistent atrial fibrillation: Secondary | ICD-10-CM | POA: Diagnosis present

## 2020-06-17 DIAGNOSIS — N400 Enlarged prostate without lower urinary tract symptoms: Secondary | ICD-10-CM | POA: Diagnosis present

## 2020-06-17 DIAGNOSIS — R778 Other specified abnormalities of plasma proteins: Secondary | ICD-10-CM

## 2020-06-17 DIAGNOSIS — Z66 Do not resuscitate: Secondary | ICD-10-CM | POA: Diagnosis not present

## 2020-06-17 DIAGNOSIS — Z452 Encounter for adjustment and management of vascular access device: Secondary | ICD-10-CM

## 2020-06-17 DIAGNOSIS — I509 Heart failure, unspecified: Secondary | ICD-10-CM

## 2020-06-17 DIAGNOSIS — I10 Essential (primary) hypertension: Secondary | ICD-10-CM | POA: Diagnosis present

## 2020-06-17 DIAGNOSIS — Z89511 Acquired absence of right leg below knee: Secondary | ICD-10-CM | POA: Diagnosis not present

## 2020-06-17 DIAGNOSIS — J811 Chronic pulmonary edema: Secondary | ICD-10-CM

## 2020-06-17 DIAGNOSIS — I130 Hypertensive heart and chronic kidney disease with heart failure and stage 1 through stage 4 chronic kidney disease, or unspecified chronic kidney disease: Secondary | ICD-10-CM | POA: Diagnosis not present

## 2020-06-17 DIAGNOSIS — M1990 Unspecified osteoarthritis, unspecified site: Secondary | ICD-10-CM | POA: Diagnosis present

## 2020-06-17 DIAGNOSIS — Z79891 Long term (current) use of opiate analgesic: Secondary | ICD-10-CM

## 2020-06-17 DIAGNOSIS — Z955 Presence of coronary angioplasty implant and graft: Secondary | ICD-10-CM | POA: Diagnosis not present

## 2020-06-17 DIAGNOSIS — N179 Acute kidney failure, unspecified: Secondary | ICD-10-CM | POA: Diagnosis present

## 2020-06-17 DIAGNOSIS — I472 Ventricular tachycardia: Secondary | ICD-10-CM | POA: Diagnosis not present

## 2020-06-17 DIAGNOSIS — Z720 Tobacco use: Secondary | ICD-10-CM | POA: Diagnosis not present

## 2020-06-17 DIAGNOSIS — J9621 Acute and chronic respiratory failure with hypoxia: Secondary | ICD-10-CM | POA: Diagnosis present

## 2020-06-17 DIAGNOSIS — I110 Hypertensive heart disease with heart failure: Secondary | ICD-10-CM | POA: Diagnosis not present

## 2020-06-17 DIAGNOSIS — Z7901 Long term (current) use of anticoagulants: Secondary | ICD-10-CM

## 2020-06-17 DIAGNOSIS — E1140 Type 2 diabetes mellitus with diabetic neuropathy, unspecified: Secondary | ICD-10-CM | POA: Diagnosis present

## 2020-06-17 DIAGNOSIS — Z7189 Other specified counseling: Secondary | ICD-10-CM | POA: Diagnosis not present

## 2020-06-17 DIAGNOSIS — I25118 Atherosclerotic heart disease of native coronary artery with other forms of angina pectoris: Secondary | ICD-10-CM | POA: Diagnosis not present

## 2020-06-17 DIAGNOSIS — I5082 Biventricular heart failure: Secondary | ICD-10-CM | POA: Diagnosis present

## 2020-06-17 DIAGNOSIS — E1151 Type 2 diabetes mellitus with diabetic peripheral angiopathy without gangrene: Secondary | ICD-10-CM | POA: Diagnosis present

## 2020-06-17 DIAGNOSIS — J45901 Unspecified asthma with (acute) exacerbation: Secondary | ICD-10-CM | POA: Diagnosis present

## 2020-06-17 DIAGNOSIS — J441 Chronic obstructive pulmonary disease with (acute) exacerbation: Secondary | ICD-10-CM | POA: Diagnosis present

## 2020-06-17 LAB — CBG MONITORING, ED
Glucose-Capillary: 79 mg/dL (ref 70–99)
Glucose-Capillary: 113 mg/dL — ABNORMAL HIGH (ref 70–99)
Glucose-Capillary: 162 mg/dL — ABNORMAL HIGH (ref 70–99)
Glucose-Capillary: 162 mg/dL — ABNORMAL HIGH (ref 70–99)

## 2020-06-17 LAB — ECHOCARDIOGRAM COMPLETE
AR max vel: 1.07 cm2
AV Area VTI: 1.02 cm2
AV Area mean vel: 1.02 cm2
AV Mean grad: 4 mmHg
AV Peak grad: 6.7 mmHg
Ao pk vel: 1.3 m/s
Calc EF: 13.8 %
Height: 67 in
MV M vel: 4.8 m/s
MV Peak grad: 92.2 mmHg
S' Lateral: 6.2 cm
Single Plane A2C EF: 13.7 %
Single Plane A4C EF: 14.4 %
Weight: 3344 oz

## 2020-06-17 LAB — COMPREHENSIVE METABOLIC PANEL
ALT: 37 U/L (ref 0–44)
AST: 81 U/L — ABNORMAL HIGH (ref 15–41)
Albumin: 3.8 g/dL (ref 3.5–5.0)
Alkaline Phosphatase: 114 U/L (ref 38–126)
Anion gap: 10 (ref 5–15)
BUN: 34 mg/dL — ABNORMAL HIGH (ref 6–20)
CO2: 25 mmol/L (ref 22–32)
Calcium: 9.1 mg/dL (ref 8.9–10.3)
Chloride: 90 mmol/L — ABNORMAL LOW (ref 98–111)
Creatinine, Ser: 1.5 mg/dL — ABNORMAL HIGH (ref 0.61–1.24)
GFR, Estimated: 54 mL/min — ABNORMAL LOW (ref >60)
Glucose, Bld: 124 mg/dL — ABNORMAL HIGH (ref 70–99)
Potassium: 5.2 mmol/L — ABNORMAL HIGH (ref 3.5–5.1)
Sodium: 125 mmol/L — ABNORMAL LOW (ref 135–145)
Total Bilirubin: 3.7 mg/dL — ABNORMAL HIGH (ref 0.3–1.2)
Total Protein: 7 g/dL (ref 6.5–8.1)

## 2020-06-17 LAB — CBC
HCT: 40.3 % (ref 39.0–52.0)
Hemoglobin: 13.1 g/dL (ref 13.0–17.0)
MCH: 26.8 pg (ref 26.0–34.0)
MCHC: 32.5 g/dL (ref 30.0–36.0)
MCV: 82.6 fL (ref 80.0–100.0)
Platelets: 184 10*3/uL (ref 150–400)
RBC: 4.88 MIL/uL (ref 4.22–5.81)
RDW: 21.9 % — ABNORMAL HIGH (ref 11.5–15.5)
WBC: 8.3 10*3/uL (ref 4.0–10.5)
nRBC: 0 % (ref 0.0–0.2)

## 2020-06-17 LAB — RESP PANEL BY RT-PCR (FLU A&B, COVID) ARPGX2
Influenza A by PCR: NEGATIVE
Influenza B by PCR: NEGATIVE
SARS Coronavirus 2 by RT PCR: NEGATIVE

## 2020-06-17 LAB — HEMOGLOBIN A1C
Hgb A1c MFr Bld: 6.1 % — ABNORMAL HIGH (ref 4.8–5.6)
Mean Plasma Glucose: 128.37 mg/dL

## 2020-06-17 LAB — PROCALCITONIN: Procalcitonin: <0.1 ng/mL

## 2020-06-17 LAB — BRAIN NATRIURETIC PEPTIDE: B Natriuretic Peptide: 1925.5 pg/mL — ABNORMAL HIGH (ref 0.0–100.0)

## 2020-06-17 LAB — TROPONIN I (HIGH SENSITIVITY)
Troponin I (High Sensitivity): 39 ng/L — ABNORMAL HIGH (ref <18)
Troponin I (High Sensitivity): 54 ng/L — ABNORMAL HIGH (ref <18)

## 2020-06-17 MED ORDER — MOMETASONE FURO-FORMOTEROL FUM 200-5 MCG/ACT IN AERO
2.0000 | INHALATION_SPRAY | Freq: Two times a day (BID) | RESPIRATORY_TRACT | Status: DC
  Administered 2020-06-18 – 2020-07-01 (×26): 2 via RESPIRATORY_TRACT
  Filled 2020-06-17 (×2): qty 8.8

## 2020-06-17 MED ORDER — PANTOPRAZOLE SODIUM 40 MG PO TBEC
40.0000 mg | DELAYED_RELEASE_TABLET | Freq: Every day | ORAL | Status: DC
  Administered 2020-06-17 – 2020-07-01 (×15): 40 mg via ORAL
  Filled 2020-06-17 (×12): qty 1

## 2020-06-17 MED ORDER — DULOXETINE HCL 30 MG PO CPEP
30.0000 mg | ORAL_CAPSULE | Freq: Every day | ORAL | Status: DC
  Administered 2020-06-17 – 2020-07-01 (×15): 30 mg via ORAL
  Filled 2020-06-17 (×15): qty 1

## 2020-06-17 MED ORDER — GABAPENTIN 400 MG PO CAPS
400.0000 mg | ORAL_CAPSULE | Freq: Four times a day (QID) | ORAL | Status: DC
  Administered 2020-06-17 – 2020-07-01 (×51): 400 mg via ORAL
  Filled 2020-06-17 (×51): qty 1

## 2020-06-17 MED ORDER — VERICIGUAT 5 MG PO TABS: 5.0000 mg | ORAL_TABLET | Freq: Every day | ORAL | Status: DC

## 2020-06-17 MED ORDER — NITROGLYCERIN 0.4 MG SL SUBL: 0.4000 mg | SUBLINGUAL_TABLET | SUBLINGUAL | Status: DC | PRN

## 2020-06-17 MED ORDER — APIXABAN 5 MG PO TABS: 5.0000 mg | ORAL_TABLET | Freq: Every day | ORAL | Status: DC

## 2020-06-17 MED ORDER — IPRATROPIUM-ALBUTEROL 20-100 MCG/ACT IN AERS
1.0000 | INHALATION_SPRAY | Freq: Four times a day (QID) | RESPIRATORY_TRACT | Status: DC | PRN
  Filled 2020-06-17: qty 4

## 2020-06-17 MED ORDER — ADULT MULTIVITAMIN W/MINERALS CH
1.0000 | ORAL_TABLET | Freq: Every day | ORAL | Status: DC
  Administered 2020-06-18 – 2020-07-01 (×14): 1 via ORAL
  Filled 2020-06-17 (×14): qty 1

## 2020-06-17 MED ORDER — IPRATROPIUM-ALBUTEROL 0.5-2.5 (3) MG/3ML IN SOLN: 3.0000 mL | Freq: Four times a day (QID) | RESPIRATORY_TRACT | Status: DC | PRN

## 2020-06-17 MED ORDER — SODIUM CHLORIDE 0.9 % IV SOLN
250.0000 mL | INTRAVENOUS | Status: DC | PRN
  Administered 2020-06-23: 250 mL via INTRAVENOUS

## 2020-06-17 MED ORDER — INSULIN ASPART 100 UNIT/ML ~~LOC~~ SOLN
0.0000 [IU] | SUBCUTANEOUS | Status: DC
  Administered 2020-06-17 – 2020-06-18 (×4): 4 [IU] via SUBCUTANEOUS
  Administered 2020-06-18 (×2): 3 [IU] via SUBCUTANEOUS
  Administered 2020-06-19: 7 [IU] via SUBCUTANEOUS
  Administered 2020-06-19: 3 [IU] via SUBCUTANEOUS
  Administered 2020-06-19: 11 [IU] via SUBCUTANEOUS
  Administered 2020-06-19: 3 [IU] via SUBCUTANEOUS
  Administered 2020-06-19 (×2): 4 [IU] via SUBCUTANEOUS
  Administered 2020-06-20: 3 [IU] via SUBCUTANEOUS
  Administered 2020-06-20: 4 [IU] via SUBCUTANEOUS
  Administered 2020-06-20: 7 [IU] via SUBCUTANEOUS
  Administered 2020-06-20 (×2): 3 [IU] via SUBCUTANEOUS
  Administered 2020-06-21: 4 [IU] via SUBCUTANEOUS
  Administered 2020-06-21: 7 [IU] via SUBCUTANEOUS
  Administered 2020-06-21 (×2): 4 [IU] via SUBCUTANEOUS
  Administered 2020-06-21: 3 [IU] via SUBCUTANEOUS
  Administered 2020-06-22: 4 [IU] via SUBCUTANEOUS
  Administered 2020-06-22: 7 [IU] via SUBCUTANEOUS
  Administered 2020-06-22: 4 [IU] via SUBCUTANEOUS
  Administered 2020-06-22: 7 [IU] via SUBCUTANEOUS
  Administered 2020-06-22: 3 [IU] via SUBCUTANEOUS
  Administered 2020-06-22 – 2020-06-23 (×2): 4 [IU] via SUBCUTANEOUS
  Administered 2020-06-23 (×2): 7 [IU] via SUBCUTANEOUS
  Administered 2020-06-23: 4 [IU] via SUBCUTANEOUS
  Administered 2020-06-24: 3 [IU] via SUBCUTANEOUS
  Administered 2020-06-24: 7 [IU] via SUBCUTANEOUS
  Administered 2020-06-24: 11 [IU] via SUBCUTANEOUS
  Administered 2020-06-24: 4 [IU] via SUBCUTANEOUS
  Administered 2020-06-25: 3 [IU] via SUBCUTANEOUS
  Administered 2020-06-25: 4 [IU] via SUBCUTANEOUS
  Administered 2020-06-25: 3 [IU] via SUBCUTANEOUS
  Administered 2020-06-25 (×2): 7 [IU] via SUBCUTANEOUS
  Administered 2020-06-26 (×2): 11 [IU] via SUBCUTANEOUS
  Administered 2020-06-26: 4 [IU] via SUBCUTANEOUS
  Administered 2020-06-26: 3 [IU] via SUBCUTANEOUS
  Administered 2020-06-26 (×2): 4 [IU] via SUBCUTANEOUS
  Administered 2020-06-27: 3 [IU] via SUBCUTANEOUS
  Administered 2020-06-27: 4 [IU] via SUBCUTANEOUS
  Administered 2020-06-27 (×2): 3 [IU] via SUBCUTANEOUS
  Administered 2020-06-28: 4 [IU] via SUBCUTANEOUS
  Administered 2020-06-28 (×2): 3 [IU] via SUBCUTANEOUS
  Administered 2020-06-29: 4 [IU] via SUBCUTANEOUS
  Administered 2020-06-29: 3 [IU] via SUBCUTANEOUS
  Administered 2020-06-29: 4 [IU] via SUBCUTANEOUS
  Administered 2020-06-29: 3 [IU] via SUBCUTANEOUS
  Administered 2020-06-29: 4 [IU] via SUBCUTANEOUS
  Administered 2020-06-30: 7 [IU] via SUBCUTANEOUS
  Administered 2020-06-30 – 2020-07-01 (×3): 3 [IU] via SUBCUTANEOUS
  Filled 2020-06-17 (×64): qty 1

## 2020-06-17 MED ORDER — UMECLIDINIUM BROMIDE 62.5 MCG/INH IN AEPB
1.0000 | INHALATION_SPRAY | Freq: Every day | RESPIRATORY_TRACT | Status: DC
  Administered 2020-06-18 – 2020-07-01 (×13): 1 via RESPIRATORY_TRACT
  Filled 2020-06-17 (×3): qty 7

## 2020-06-17 MED ORDER — APIXABAN 5 MG PO TABS
5.0000 mg | ORAL_TABLET | Freq: Two times a day (BID) | ORAL | Status: DC
  Administered 2020-06-17 – 2020-06-20 (×6): 5 mg via ORAL
  Filled 2020-06-17 (×6): qty 1

## 2020-06-17 MED ORDER — ATORVASTATIN CALCIUM 80 MG PO TABS
80.0000 mg | ORAL_TABLET | Freq: Every day | ORAL | Status: DC
Start: 2020-06-17 — End: 2020-07-01
  Administered 2020-06-17 – 2020-07-01 (×15): 80 mg via ORAL
  Filled 2020-06-17 (×5): qty 4
  Filled 2020-06-17: qty 1
  Filled 2020-06-17 (×3): qty 4
  Filled 2020-06-17 (×2): qty 1
  Filled 2020-06-17 (×5): qty 4

## 2020-06-17 MED ORDER — SODIUM CHLORIDE 0.9% FLUSH
3.0000 mL | Freq: Two times a day (BID) | INTRAVENOUS | Status: DC
  Administered 2020-06-17 – 2020-07-01 (×27): 3 mL via INTRAVENOUS

## 2020-06-17 MED ORDER — EZETIMIBE 10 MG PO TABS
10.0000 mg | ORAL_TABLET | Freq: Every day | ORAL | Status: DC
  Administered 2020-06-17 – 2020-07-01 (×15): 10 mg via ORAL
  Filled 2020-06-17 (×16): qty 1

## 2020-06-17 MED ORDER — MULTI-VITAMINS PO TABS: 1.0000 | ORAL_TABLET | Freq: Every day | ORAL | Status: DC

## 2020-06-17 MED ORDER — MIDODRINE HCL 5 MG PO TABS
5.0000 mg | ORAL_TABLET | Freq: Three times a day (TID) | ORAL | Status: DC
Start: 2020-06-17 — End: 2020-06-23
  Administered 2020-06-17 – 2020-06-23 (×17): 5 mg via ORAL
  Filled 2020-06-17 (×19): qty 1

## 2020-06-17 MED ORDER — PANTOPRAZOLE SODIUM 40 MG PO TBEC
40.0000 mg | DELAYED_RELEASE_TABLET | Freq: Every day | ORAL | Status: DC
  Administered 2020-06-17: 40 mg via ORAL
  Filled 2020-06-17 (×3): qty 1

## 2020-06-17 MED ORDER — FUROSEMIDE 10 MG/ML IJ SOLN
6.0000 mg/h | INTRAVENOUS | Status: DC
  Administered 2020-06-18: 4 mg/h via INTRAVENOUS
  Administered 2020-06-19 – 2020-06-20 (×2): 8 mg/h via INTRAVENOUS
  Administered 2020-06-22 – 2020-06-25 (×3): 6 mg/h via INTRAVENOUS
  Filled 2020-06-17 (×9): qty 20

## 2020-06-17 MED ORDER — CLOPIDOGREL BISULFATE 75 MG PO TABS
75.0000 mg | ORAL_TABLET | Freq: Every day | ORAL | Status: DC
  Administered 2020-06-17 – 2020-06-20 (×4): 75 mg via ORAL
  Filled 2020-06-17 (×3): qty 1

## 2020-06-17 MED ORDER — METOPROLOL SUCCINATE ER 50 MG PO TB24
25.0000 mg | ORAL_TABLET | Freq: Every day | ORAL | Status: DC
  Administered 2020-06-18: 25 mg via ORAL
  Filled 2020-06-17 (×2): qty 1

## 2020-06-17 MED ORDER — ACETAMINOPHEN 325 MG PO TABS
650.0000 mg | ORAL_TABLET | ORAL | Status: DC | PRN
  Filled 2020-06-17: qty 2

## 2020-06-17 MED ORDER — TAMSULOSIN HCL 0.4 MG PO CAPS
0.4000 mg | ORAL_CAPSULE | Freq: Every day | ORAL | Status: DC
  Administered 2020-06-17 – 2020-06-30 (×14): 0.4 mg via ORAL
  Filled 2020-06-17 (×14): qty 1

## 2020-06-17 MED ORDER — FUROSEMIDE 10 MG/ML IJ SOLN
80.0000 mg | Freq: Once | INTRAMUSCULAR | Status: AC
  Administered 2020-06-17: 80 mg via INTRAVENOUS
  Filled 2020-06-17: qty 8

## 2020-06-17 MED ORDER — FUROSEMIDE 10 MG/ML IJ SOLN: 60.0000 mg | Freq: Two times a day (BID) | INTRAMUSCULAR | Status: DC

## 2020-06-17 MED ORDER — OXYCODONE HCL 5 MG PO TABS
5.0000 mg | ORAL_TABLET | Freq: Four times a day (QID) | ORAL | Status: DC | PRN
  Administered 2020-06-17 – 2020-06-23 (×12): 5 mg via ORAL
  Filled 2020-06-17 (×12): qty 1

## 2020-06-17 MED ORDER — SODIUM CHLORIDE 0.9% FLUSH: 3.0000 mL | INTRAVENOUS | Status: DC | PRN

## 2020-06-17 MED ORDER — GABAPENTIN 800 MG PO TABS
400.0000 mg | ORAL_TABLET | Freq: Four times a day (QID) | ORAL | Status: DC
  Filled 2020-06-17: qty 0.5

## 2020-06-17 MED ORDER — APIXABAN 5 MG PO TABS
5.0000 mg | ORAL_TABLET | Freq: Once | ORAL | Status: AC
  Administered 2020-06-17: 5 mg via ORAL
  Filled 2020-06-17: qty 1

## 2020-06-17 MED ORDER — METHYLPREDNISOLONE SODIUM SUCC 40 MG IJ SOLR
40.0000 mg | Freq: Every day | INTRAMUSCULAR | Status: DC
  Administered 2020-06-17 – 2020-06-26 (×10): 40 mg via INTRAVENOUS
  Filled 2020-06-17 (×10): qty 1

## 2020-06-17 MED ORDER — IPRATROPIUM-ALBUTEROL 0.5-2.5 (3) MG/3ML IN SOLN
3.0000 mL | Freq: Once | RESPIRATORY_TRACT | Status: AC
  Administered 2020-06-17: 3 mL via RESPIRATORY_TRACT
  Filled 2020-06-17: qty 3

## 2020-06-17 MED ORDER — ONDANSETRON HCL 4 MG/2ML IJ SOLN: 4.0000 mg | Freq: Four times a day (QID) | INTRAMUSCULAR | Status: DC | PRN

## 2020-06-17 MED ORDER — TRAZODONE HCL 50 MG PO TABS
150.0000 mg | ORAL_TABLET | Freq: Every day | ORAL | Status: DC
  Administered 2020-06-17 – 2020-06-30 (×14): 150 mg via ORAL
  Filled 2020-06-17 (×14): qty 1

## 2020-06-17 NOTE — ED Notes (Signed)
Admitting rounding at bedside, notified of soft pressures. Continuing to cycle, will reassess and update provider

## 2020-06-17 NOTE — ED Notes (Signed)
Hold BP meds and Lasix dose this am per MD, for low BP's

## 2020-06-17 NOTE — Progress Notes (Signed)
Same day note Patient seen and examined personally, I reviewed the chart, history and physical and admission note, done by admitting physician this morning and agree with the same with following addendum.  Please refer to the morning admission note for more detailed plan of care.  Briefly, 60 year old male with CAD/LAD last cath 01/20/2020 with multivessel disease not amenable to stenting, severe systolic CHF with EF 73%, diabetes with neuropathy, PAD status post right BKA, persistent A. fib on Eliquis, COPD, tobacco abuse presented with shortness of breath fluid retention in legs and intermittent chest pain. In the ED BNP 195 troponin 39> 54 creatinine 1.5 baseline 0.8 EKG A. fib chest x-ray with CHF fluid overload patient was placed on IV Lasix and admitted this morning.  On exam patient is alert awake oriented resting comfortably able to lay somewhat flat, reports shortness of breath is improving.  Blood pressure has been soft 70-90s-asked RN to hold the blood pressure meds and Lasix this morning  covid came back negative. Procalcitonin negative blood 0.1 On exam.  He has bilateral wheezing and also basal crackles, he endorsed history of COPD.    Issues being addressed;  Acute on chronic severe systolic CHF with EF less than 20% in August/2021.  Pro-Cal negative, BNP elevated 1195, hs trop  39>54.  Left leg appears swollen.  Cardiology consulted we will continue on IV Lasix as BP tolerates-with close monitoring of renal function, weight intake output.  Repeat echo pending.  Not on ACE or ARB currently on metolazone on hold, continue home beta-blocker as Bp tolerates.  AKI ?cardiorenal syndrome versus prerenal: Creatinine worsening with IV Lasix,will consult nephrology -send message to Dr Candiss Norse, given hyperkalemia difficulty with fluid management.  Recent Labs  Lab 06/12/20 1327 06/14/20 1413 06/17/20 0130  BUN 20 24* 34*  CREATININE 1.13 1.10 1.50*   Hyperkalemia potassium 5.2  monitor.repeat BMP later. Monitor closely while on diuresis.  Metolazone held  Chronic hyponatremia in the setting of CHF.  Monitor  CAD/hypertension: BP is soft. Hold lasix and BP meds if BP < 100. Cards input pending.  Continue Plavix Persistent atrial fibrillation on Eliquis/metoprolol T2DM-sugar stable Recent Labs  Lab 06/17/20 0638 06/17/20 1144  GLUCAP 79 113*   COPD with acute exacerbation, not on home oxygen/chronic tobacco abuse, with active wheezing - we will add on steroid continue bronchodilators COVID-19 negative.  resukme Dulera and Incruse.  Long-term use of opiate analgesics-  Cont Oxycodone if BP tolerates.  Med Rec pending alerted nursing staff In ED. Patient  Sates he has been self updating.

## 2020-06-17 NOTE — ED Triage Notes (Signed)
Pt states has been shob and feeling dizzy for two days. Pt states has also had chest pain off and on. Pt appears in no acute distress. Pt with swelling noted to bilateral feet. Pt denies fever.

## 2020-06-17 NOTE — Consult Note (Addendum)
Cardiology Consultation:   Patient ID: [name] [name] MRN: [id number]; DOB: 29-Jul-1960  Admit date: 06/17/2020 Date of Consult: 06/17/2020  Primary Care Provider: [name], MD [facility] Cardiologist: Ida Rogue, MD  [facility] Electrophysiologist:  None    Patient Profile:   [name] [name] is a 60 y.o. male with a hx of ICM EF 20-25%, CAD/PCI, tobacco abuse, COPD, PAD s/p L SFA stenting, s/p R BKA, afib on Eliquis, OSA noncompliant with CPAP, hx of leaving AMA and noncompliance who is being seen today for the evaluation of heart failure at the request of Dr. Maren Beach.  History of Present Illness:   [name] [name] is followed by Dr. Rockey Situ for the above cardiac issues. The patient has a history of multivessel CAD. He had LAD and RCA interventions in 2013 and 2014. In 04/2017 he was found to have new cardiomyopathy with EF 25-30%. Cath showed severe multivessel CAD with CT referral. He sought a second opinion at [facility] and was followed by them briefly. Cardiac MRI in 08/2017 showed EF 19% with multiple areas of ischemia with recommendation to optimize medically prior to CABG. He subsequently denied CABG. Echos over the years have showed persistent cardiomyopathy.    The patient was hospitalized in August 2021 for Afib found to have elevated troponin. Echo showed EF<20%. He was also hypotensive.Transfer to [facility] for cath was considered but patient declined. LHC at [facility] showed 3V disease, patent prox LAD stent, mod dLAD disease, significant stenosis proximal left cx, bifurcation lesions,patent stent in the RCA, significant proximal disease in the mid to distal segment. It was felt cardiomyopathy was out of proportion to the CAD. Atherectomy to left circumflex was considered but patient was not optimized form a heart failure standpoint. No PCI was performed and pt eventually left AMA.   The patient was admitted to [facility] 01/29/20 for CHF and left AMA on 8/25  He has been seen in follow up since  then. He did start taking Eliquis for a/c and was on metoprolol for rate control. Amiodarone was also started. He has had frequent trips to same day surgery for IV lasix. He was taking torsemide 42m in the morning. He has had hyponatremia on diuretics.   The most recent visit 05/16/20 with Dr. GRockey Situand torsemide was increased to BID and he was given IV lasix. Due to question of compliance with Eliquis he was not set up for DCCV. Amiodarone was stopped.  The patient presented to the ER 06/18/19 for shortness of breath and lower leg edema for the last couple days. He also endorses orthopnea. He reported dizziness, cough and generalized weakness. Apparently he missed some lasix doses. He says he is not sure what he is taking and regularly misses medication doses. Denied chest pain, fever, chills, abdominal pain, vomiting, diarrhea.   In the ED BP 117/80, pulse 100, RR 20, 98% O2. Labs showed sodium 125, potassium 5.2, creatinine 1.5, BUN 34, AST 81. HS trop 39>54. BNP 1925. EKG showed Afib 100bpm, RBBB. CXR showed CHF/volume overload with interstitial pulmonary edema and cardiomegaly, possible pulmonary HTN. Respiratory panel negative. The patient was given IV lasix, duoneb and admitted for further work-up.    Past Medical History:  Diagnosis Date  . Arthritis   . Asthma   . Atherosclerosis   . BPH (benign prostatic hyperplasia)   . Carotid arterial disease ([place]   . Charcot's joint of foot, right   . Chronic combined systolic (congestive) and diastolic (congestive) heart failure (HCC)  a. 02/2013 EF 50% by LV gram; b. 04/2017 Echo: EF 25-30%. diff HK. Gr2 DD. Mod dil LA/RV. PASP 74mHg.  .Marland KitchenCOPD (chronic obstructive pulmonary disease) ([place]   . Coronary artery disease    a. 2013 S/P PCI of LAD ([facility];  b. 03/2013 PCI: RCA 90p ( 2.5 x 23 mm DES); c. 02/2014 Cath: patent RCA stent->Med Rx; d. 04/2017 Cath: LM nl, LAd 30ost/p, 635m, D1 80ost, LCX 90p/m, OM1 85, RCA 70/20p, 8061m0d->Referred for CT  Surg-felt to be poor candidate.  . Diabetic neuropathy ([place]CC[place] . Diabetic ulcer of right foot (HCC[place]il[place] . Hernia   . Hyperlipidemia   . Hypertension   . Ischemic cardiomyopathy    a. 04/2017 Echo: EF 25-30%; b. 08/2017 Cardiac MRI ([facility]): EF 19%, sev glob HK. RVEF 20%, mod BAE, triv MR, mild-mod TR. Basal lateral subendocardial infarct (viable), inf/infsept, dist septal ischemia, basal to mid lat peri-infarct ischemia.  . Morbid obesity (HCCNew [place] . Neuropathy   . PAD (peripheral artery disease) ([place]CC[place]  a. Followed by Dr. DewLucky Cowboy. 08/2010 Periph Angio: RSFA 70-80p (6X60 self-expanding stent); c. 09/2010 Periph Angio: L SFA 100p (7x unknown length self-expanding stent); d. 06/2015 Periph Angio: R SFA short segment occlusion (6x12 self-expanding stent).  . Restless leg syndrome   . Sleep apnea   . Subclavian artery stenosis, right (HCCBar[place] . Syncope and collapse   . Tobacco use    a. 75+ yr hx - still smoking 1ppd, down from 2 ppd.  . Type II diabetes mellitus (HCCHay[place] . Varicose vein     Past Surgical History:  Procedure Laterality Date  . ABDOMINAL AORTAGRAM N/A 06/23/2013   Procedure: ABDOMINAL AORMaxcine HamSurgeon: MuhWellington HampshireD;  [facility];  Service: Cardiovascular;  Laterality: N/A;  . AMPUTATION Right 04/01/2018   Procedure: AMPUTATION BELOW KNEE;  Surgeon: DewAlgernon HuxleyD;  Location: [facility] ORS;  Service: Vascular;  Laterality: Right;  . APPENDECTOMY    . CARDIAC CATHETERIZATION  10/14   [facility]; X1 STENT PROXIMAL RCA  . CARDIAC CATHETERIZATION  02/2010   [facility]  . CARDIAC CATHETERIZATION  02/21/2014   [facility]  . CLAVICLE SURGERY    . HERNIA REPAIR    . IRRIGATION AND DEBRIDEMENT FOOT Right 01/04/2018   Procedure: IRRIGATION AND DEBRIDEMENT FOOT;  Surgeon: FowSamara DeistPM;  Location: [facility] ORS;  Service: Podiatry;  Laterality: Right;  . LEFT HEART CATH AND CORONARY ANGIOGRAPHY N/A 01/20/2020   Procedure: LEFT HEART CATH AND CORONARY ANGIOGRAPHY;  Surgeon: AriWellington HampshireMD;  [facility];  Service: Cardiovascular;  Laterality: N/A;  . LEFT HEART CATH AND CORS/GRAFTS ANGIOGRAPHY N/A 04/28/2017   Procedure: LEFT HEART CATH AND CORONARY ANGIOGRAPHY;  Surgeon: AriWellington HampshireD;  [facility];  Service: Cardiovascular;  Laterality: N/A;  . LOWER EXTREMITY ANGIOGRAPHY Right 03/03/2018   Procedure: LOWER EXTREMITY ANGIOGRAPHY;  Surgeon: SchKatha CabalD;  [facility];  Service: Cardiovascular;  Laterality: Right;  . LOWER EXTREMITY ANGIOGRAPHY Left 08/24/2018   Procedure: LOWER EXTREMITY ANGIOGRAPHY;  Surgeon: DewAlgernon HuxleyD;  [facility];  Service: Cardiovascular;  Laterality: Left;  . LOWER EXTREMITY ANGIOGRAPHY Left 06/14/2019   Procedure: LOWER EXTREMITY ANGIOGRAPHY;  Surgeon: DewAlgernon HuxleyD;  [facility];  Service: Cardiovascular;  Laterality: Left;  . LOWER EXTREMITY ANGIOGRAPHY Left 08/02/2019   Procedure: LOWER EXTREMITY  ANGIOGRAPHY;  Surgeon: Algernon Huxley, MD;  Location: [place] [facility];  Service: Cardiovascular;  Laterality: Left;  . PERIPHERAL ARTERIAL STENT GRAFT     x2 left/right  . PERIPHERAL VASCULAR BALLOON ANGIOPLASTY Right 01/06/2018   Procedure: PERIPHERAL VASCULAR BALLOON ANGIOPLASTY;  Surgeon: Katha Cabal, MD;  Location: [place] [facility];  Service: Cardiovascular;  Laterality: Right;  . PERIPHERAL VASCULAR CATHETERIZATION N/A 07/06/2015   Procedure: Abdominal Aortogram w/Lower Extremity;  Surgeon: Algernon Huxley, MD;  Location: [place] [facility];  Service: Cardiovascular;  Laterality: N/A;  . PERIPHERAL VASCULAR CATHETERIZATION  07/06/2015   Procedure: Lower Extremity Intervention;  Surgeon: Algernon Huxley, MD;  Location: [place] [facility];  Service: Cardiovascular;;     Home Medications:  Prior to Admission medications   Medication Sig Start Date End Date Taking? Authorizing Provider  albuterol (VENTOLIN HFA) 108 (90 Base)  MCG/ACT inhaler Inhale into the lungs every 6 (six) hours as needed for wheezing or shortness of breath.    [provider]  atorvastatin (LIPITOR) 80 MG tablet Take 80 mg by mouth daily.    [provider]  budesonide-formoterol (SYMBICORT) 80-4.5 MCG/ACT inhaler Inhale 2 puffs into the lungs as needed.    [provider]  clopidogrel (PLAVIX) 75 MG tablet Take 75 mg by mouth daily.  08/22/17   [provider]  collagenase (SANTYL) ointment Apply 1 application topically daily. 08/11/19   Kris Hartmann, NP  doxycycline (VIBRAMYCIN) 100 MG capsule Take 1 capsule (100 mg total) by mouth 2 (two) times daily. 05/22/20   Kris Hartmann, NP  DULoxetine (CYMBALTA) 20 MG capsule Take 30 mg by mouth daily.     [provider]  ELIQUIS 5 MG TABS tablet Take 5 mg by mouth daily.  02/02/20   [provider]  empagliflozin (JARDIANCE) 10 MG TABS tablet Take 10 mg by mouth daily.  02/03/20   [provider]  ezetimibe (ZETIA) 10 MG tablet Take 10 mg by mouth daily.     [provider]  gabapentin (NEURONTIN) 800 MG tablet Take 800 mg by mouth 4 (four) times daily.  08/14/17 04/04/29  [provider]  Ipratropium-Albuterol (COMBIVENT RESPIMAT) 20-100 MCG/ACT AERS respimat Inhale 1 puff into the lungs every 6 (six) hours as needed for wheezing.    [provider]  ipratropium-albuterol (DUONEB) 0.5-2.5 (3) MG/3ML SOLN Inhale 3 mLs into the lungs every 6 (six) hours as needed (wheezing/sob).     [provider]  isosorbide mononitrate (IMDUR) 30 MG 24 hr tablet Take 30 mg by mouth daily.    [provider]  magnesium oxide (MAG-OX) 400 MG tablet Take 400 mg by mouth.  02/03/20   [provider]  metFORMIN (GLUCOPHAGE-XR) 500 MG 24 hr tablet Take 1,000 mg by mouth 2 (two) times daily.     [provider]  metolazone (ZAROXOLYN) 5 MG tablet Take 1 tablet (5 mg total) by mouth daily. Take 1 tablet  today, Saturday & Sunday. Take additional potassium tablet those 3 days as well. 06/15/20 09/13/20  Alisa Graff, FNP  metoprolol succinate (TOPROL-XL) 25 MG 24 hr tablet Take 1 tablet (25 mg total) by mouth daily. 05/16/20   Minna Merritts, MD  mometasone-formoterol (DULERA) 200-5 MCG/ACT AERO Inhale 2 puffs into the lungs as needed.     [provider]  Multiple Vitamin (MULTI-VITAMINS) TABS Take 1 tablet by mouth daily.     [provider]  mupirocin ointment (  BACTROBAN) 2 % SMARTSIG:1 Application Topical 2-3 Times Daily 03/13/20   [provider]  naloxone (NARCAN) 4 MG/0.1ML LIQD nasal spray kit Place into the nose as directed.  03/03/20   [provider]  nitroGLYCERIN (NITROSTAT) 0.4 MG SL tablet Place 1 tablet (0.4 mg total) under the tongue every 5 (five) minutes as needed for chest pain. 05/19/19   Alisa Graff, FNP  nystatin (MYCOSTATIN) 100000 UNIT/ML suspension Take 5 mLs by mouth 4 (four) times daily. 01/01/20   [provider]  omeprazole (PRILOSEC) 40 MG capsule Take 40 mg by mouth daily.    [provider]  Oxycodone HCl 10 MG TABS Take 10 mg by mouth every 6 (six) hours as needed (pain).  01/15/18   [provider]  pantoprazole (PROTONIX) 40 MG tablet Take 40 mg by mouth daily. 02/02/20   [provider]  potassium chloride (KLOR-CON) 10 MEQ tablet Take 20 mEq by mouth daily.    [provider]  promethazine (PHENERGAN) 25 MG tablet Take 25 mg by mouth every 6 (six) hours as needed.  01/01/20   [provider]  sodium chloride 1 g tablet Take 1 tablet (1 g total) by mouth 3 (three) times daily with meals. 06/12/20   Alisa Graff, FNP  SYMBICORT 80-4.5 MCG/ACT inhaler Inhale 2 puffs into the lungs daily.  01/01/20   [provider]  torsemide (DEMADEX) 20 MG tablet Take 2 tablets (40 mg total) by mouth 2 (two) times daily. Patient taking differently: Take 40 mg by mouth 2 (two) times  daily. Taking 662m AM/ 273mPM 05/16/20   GoMinna MerrittsMD  traZODone (DESYREL) 150 MG tablet Take 150 mg by mouth at bedtime. 07/12/19   [provider]  umeclidinium bromide (INCRUSE ELLIPTA) 62.5 MCG/INH AEPB Inhale 1 puff into the lungs daily.  02/03/20   [provider]  Vericiguat 5 MG TABS Take 5 mg by mouth daily.    [provider]    Inpatient Medications: Scheduled Meds: . ezetimibe  10 mg Oral Daily  . furosemide  80 mg Intramuscular Once  . insulin aspart  0-20 Units Subcutaneous Q4H  . metoprolol succinate  25 mg Oral Daily  . midodrine  5 mg Oral TID WC  . potassium chloride  40 mEq Oral Once  . sodium chloride flush  3 mL Intravenous Q12H  . tamsulosin  0.4 mg Oral QPC supper   Continuous Infusions: . sodium chloride    . furosemide (LASIX) 200 mg in dextrose 5% 100 mL (62m48mL) infusion Stopped (06/17/20 1125)   PRN Meds: sodium chloride, acetaminophen, nitroGLYCERIN, ondansetron (ZOFRAN) IV, sodium chloride flush  Allergies:    Allergies  Allergen Reactions  . Dulaglutide Anaphylaxis, Diarrhea and Hives  . Other Itching    Skin itching associated with nitro patch  . Prednisone Rash    Social History:   Social History   Socioeconomic History  . Marital status: Single    Spouse name: Not on file  . Number of children: 0  . Years of education: 12 97 Highest education level: 12th grade  Occupational History  . Occupation: on disability  Tobacco Use  . Smoking status: Current Every Day Smoker    Packs/day: 1.00    Years: 41.00    Pack years: 41.00    Types: Cigarettes  . Smokeless tobacco: Never Used  Vaping Use  . Vaping Use: Never used  Substance and Sexual Activity  . Alcohol use: No  .  Drug use: No  . Sexual activity: Yes  Other Topics Concern  . Not on file  Social History Narrative   Lives at home in [place] with girlfriend. Independent at baseline.   Social Determinants of Health   Financial Resource  Strain: Not on file  Food Insecurity: Not on file  Transportation Needs: Not on file  Physical Activity: Not on file  Stress: Not on file  Social Connections: Not on file  Intimate Partner Violence: Not on file    Family History:   Family History  Problem Relation Age of Onset  . Heart attack Father   . Heart disease Father   . Hypertension Father   . Hyperlipidemia Father   . Hypertension Mother   . Hyperlipidemia Mother      ROS:  Please see the history of present illness.  All other ROS reviewed and negative.     Physical Exam/Data:   Vitals:   06/17/20 1039 06/17/20 1107 06/17/20 1108 06/17/20 1129  BP: (!) 89/77   (!) 77/49  Pulse:  95 (!) 105 94  Resp: (!) 22 19 19 17   Temp:      TempSrc:      SpO2:  96% 94%   Weight:      Height:        Intake/Output Summary (Last 24 hours) at 06/17/2020 1155 Last data filed at 06/17/2020 0756 Gross per 24 hour  Intake -  Output 400 ml  Net -400 ml   Last 3 Weights 06/17/2020 05/30/2020 05/30/2020  Weight (lbs) 209 lb 213 lb 213 lb  Weight (kg) 94.802 kg 96.616 kg 96.616 kg  Some encounter information is confidential and restricted. Go to Review Flowsheets activity to see all data.     Body mass index is 32.73 kg/m.  General:  Well nourished, well developed, in no acute distress HEENT: normal Lymph: no adenopathy Neck: difficult to assess JVD Endocrine:  No thryomegaly Vascular: No carotid bruits; FA pulses 2+ bilaterally without bruits  Cardiac:  normal S1, S2; RRR; no murmur  Lungs:  Wheezing, diffusely diminished, crackels Abd: soft, nontender, no hepatomegaly  Ext: 2+ edema, RBKA Musculoskeletal:  No deformities, BUE and BLE strength normal and equal Skin: warm and dry  Neuro:  CNs 2-12 intact, no focal abnormalities noted Psych:  Normal affect   EKG:  The EKG was personally reviewed and demonstrates:  Afib, 100bpm, RBBB, LAD Telemetry:  Telemetry was personally reviewed and demonstrates:  Afib HR generally in  the 90s but up to 120s, PVCs  Relevant CV Studies:  Echo ordered  Echo 01/2020 1. Left ventricular ejection fraction, by estimation, is <20%. The left  ventricle has severely decreased function. The left ventricle demonstrates  global hypokinesis. The left ventricular internal cavity size was  moderately dilated. Left ventricular  diastolic parameters are indeterminate.  2. Right ventricular systolic function is moderately reduced. The right  ventricular size is moderately enlarged. There is normal pulmonary artery  systolic pressure. The estimated right ventricular systolic pressure is  11.5 mmHg.  3. Left atrial size was mildly dilated.  4. Mild mitral valve regurgitation.  5. Tricuspid valve regurgitation is moderate.   Cardiac cath 01/20/20   Prox RCA-1 lesion is 80% stenosed.  Prox RCA-2 lesion is 20% stenosed.  Prox RCA to Mid RCA lesion is 80% stenosed.  Prox LAD lesion is 20% stenosed.  1st Diag lesion is 80% stenosed.  Mid LAD lesion is 20% stenosed.  Dist LAD lesion is 60% stenosed.  Prox Cx to Mid Cx lesion is 90% stenosed.  LPAV lesion is 95% stenosed.   1. Significant underlying three-vessel coronary artery disease. Patent proximal LAD stent with mild in-stent restenosis. Moderate diffuse disease in the distal LAD. Significant stenosis in the proximal left circumflex at the origin of the posterior AV groove artery which is also heavily diseased at the ostium. This is a bifurcation lesion and heavily calcified. RCA stent is patent. However, there is significant proximal disease in the whole mid to distal segment is diffusely diseased and small caliber. 2. Left ventricular angiography was not performed. EF was severe reduced by echo. 3. Moderately elevated left ventricular end-diastolic pressure at 28 mmHg  Recommendations: The patient's cardiomyopathy seems to be out of proportion to his coronary artery disease as the LAD itself does not seem to have  obstructive disease at the present time. The RCA is diffusely diseased and too small to stent. The only target for revascularization would be the left circumflex but it is a calcified bifurcation lesion and likely requires atherectomy. In order to do this safely, the patient has to be optimized from a heart failure standpoint as he appears to be volume overloaded. Recommend intravenous diuresis. If limited by hypotension, we might need to consider inotropic support and small dose norepinephrine drip initially. I added small dose digoxin. Treat underlying atrial fibrillation. Resume heparin drip 8 hours after sheath pull.   Laboratory Data:  High Sensitivity Troponin:   Recent Labs  Lab 06/17/20 0049 06/17/20 0420  TROPONINIHS 39* 54*     Chemistry Recent Labs  Lab 06/12/20 1327 06/14/20 1413 06/17/20 0130  NA 126* 124* 125*  K 4.6 4.6 5.2*  CL 89* 89* 90*  CO2 29 27 25   GLUCOSE 115* 113* 124*  BUN 20 24* 34*  CREATININE 1.13 1.10 1.50*  CALCIUM 9.0 9.0 9.1  GFRNONAA >60 >60 54*  ANIONGAP 8 8 10     Recent Labs  Lab 06/17/20 0130  PROT 7.0  ALBUMIN 3.8  AST 81*  ALT 37  ALKPHOS 114  BILITOT 3.7*   Hematology Recent Labs  Lab 06/17/20 0049  WBC 8.3  RBC 4.88  HGB 13.1  HCT 40.3  MCV 82.6  MCH 26.8  MCHC 32.5  RDW 21.9*  PLT 184   BNP Recent Labs  Lab 06/12/20 1327 06/14/20 1413 06/17/20 0049  BNP 1,985.3* 2,062.1* 1,925.5*    DDimer No results for input(s): DDIMER in the last 168 hours.   Radiology/Studies:  DG Chest 1 View  Result Date: 06/17/2020 CLINICAL DATA:  Shortness of breath and dizziness for 2 days, intermittent chest pain, bilateral lower extremity swelling, no fever EXAM: CHEST  1 VIEW COMPARISON:  Radiograph 01/19/2020, CT 08/30/2019 FINDINGS: Some increased hazy interstitial opacities are present towards the lung bases is central pulmonary vascular congestion, as well as fissural and septal thickening. No consolidative opacity. No  pneumothorax or effusion. Cardiomegaly, similar to prior portable radiography. Slight increasing prominence of the central pulmonary arteries is noted. The aorta is calcified. The remaining cardiomediastinal contours are unremarkable. Degenerative changes are present in the imaged spine and shoulders. Remote deformity of the distal right clavicle. No acute osseous or soft tissue abnormality. IMPRESSION: 1. Findings consistent with CHF/volume overload with interstitial pulmonary edema and cardiomegaly. 2. Slight increasing prominence of the central pulmonary arteries, suspicious for pulmonary artery hypertension. Electronically Signed   By: Lovena Le M.D.   On: 06/17/2020 01:33   US ABDOMEN LIMITED RUQ (LIVER/GB)  Result Date: 06/17/2020  CLINICAL DATA:  Hyperbilirubinemia EXAM: ULTRASOUND ABDOMEN LIMITED RIGHT UPPER QUADRANT COMPARISON:  03/07/2014 abdominal CT FINDINGS: Gallbladder: Gallbladder wall thickening to 4 mm.  No stone or focal tenderness. Common bile duct: Diameter: 7 mm. Liver: No focal lesion identified. Within normal limits in parenchymal echogenicity. Nodular appearance of the liver surface. Portal vein is patent on color Doppler imaging with normal direction of blood flow towards the liver. Portal vein flow is pulsatile, presumably from cirrhosis, but possibly also related to right heart failure given chest CT findings March 2021. Other: Small volume ascites. IMPRESSION: 1. Cirrhotic appearing liver surface with small volume ascites and pulsatile portal vein flow. 2. Thickened gallbladder without calculus, considered reactive. Electronically Signed   By: Monte Fantasia M.D.   On: 06/17/2020 05:46     Assessment and Plan:   Acute on chronic combined heart failure/ mixed ischemic and NICM with EF <20% - In august he was admitted for NSTEMI and EF <20%. Cath showed severe multivessel CAD. CM was felt to be out of proportion to CAD.  - Patient has a history of medication and diet  noncompliance. He has come in multiple times for IV lasix. He was taking Torsemide at home, 61m BID per last cardiology note. Apparently has missed some doses - In the ED BNP elevated and CXR with CHF - IV lasix 80 mg BID. Patient hypotensive and is now on IV lasix drip - PTA metoprolol and Imdur. Held for soft pressures -  Midodrine 564mBID - No ACE/ARB with history of hypotension and CKD - Echo ordered - strict I/Os, daily weights, monitor creatinine  AKI  - creatinine 1.5 on arrival - baseline around 1 - IV lasix as above - daily BMET  Chronic hyponatremia - suspect 2/2 to volume overload  Elevated troponin with known CAD with prior PCI  - [facility] He had a cath in August showing no great PCI targets btu considered atherectomy of Lcx however patient eventually left AMA - No chest pain reported - HS trop mildly elevated abd - continue BB, statin, plavix,  Persistent Afib - hx of noncompliance on Eliquis - Dr. GoRockey Situas considering DCCV but this was held due to noncompliance. Pt was also trialed on amiodarone - EKG with Afib and heart rate 100 bpm - Metoprolol for rate control>>held for soft pressures. Administer as able  HLD - continue home atorvastatin 8024maily  HTN - home meds conitnued  DM2 - SSI per IM   New York Heart Association (NYHA) Functional Class NYHA Class II     For questions or updates, please contact [facility] Please consult [email or web address] for contact info under    Signed, Cadence H FNinfa MeekerA-C  06/17/2020 11:55 AM   History and all data above reviewed.  Patient examined.  I agree with the findings as above.  The patient presents with massive volume overload.  He gets IV Lasix routinely through the HF clinic.  He says that he is compliant with PO meds and only eats salt on his baked potato or his french fries.  He says he weights himself sometimes.  He presents today because his weakness, confusion and  weight have continued to get worse.  He has missed two doses of IV Lasix through the holidays.  His weight is actually lower than it was in 12/21.  BP in the ED is low.  Creat is mildly elevated.     The patient exam reveals CORUGG:[id number]  Lungs: Decreased breath sounds with crackles  ,  Abd: Distended with Positive bowel sounds, no rebound no guarding sounds, Ext Severe edema to the chest.   .  All available labs, radiology testing, previous records reviewed. Agree with documented assessment and plan. Acute on chronic systolic HF:  Likely in a low level cardiogenic shock.  We can start with the IV Lasix ordered today and see how he does with urine output and weight.   He likely will need a PICC line and therapy guided by CoOx.  He might need inotropic therapy depending on his response to diuresis.  No room to titrate meds with his hypotension on midodrine.    Jeneen Rinks Deleon Passe  4:40 PM  06/17/2020

## 2020-06-17 NOTE — ED Notes (Signed)
Notified MD of pt's Afib w/PVC's rate of 120-130's

## 2020-06-17 NOTE — ED Provider Notes (Signed)
[facility] Emergency Department Provider Note  ____________________________________________   Event Date/Time   First MD Initiated Contact with Patient 06/17/20 0425     (approximate)  I have reviewed the triage vital signs and the nursing notes.   HISTORY  Chief Complaint Shortness of Breath   HPI [name] [name] is a 60 y.o. 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  Patient thinks he has missed a couple doses of his Lasix over the last couple of days.  He states he tried albuterol treatment at home which he thinks helped his shortness of breath a little bit but still feels little short of breath.  He denies any chest pain, fevers, chills, abdominal pain, vomiting, diarrhea, dysuria, headache area, sore throat, rash or acute extremity pain.  He is not sure if he is losing any weight or gaining weight.  No recent falls or injuries.  Endorses continued tobacco abuse but denies EtOH/drugs.   Past Medical History:  Diagnosis Date  . Arthritis   . Asthma   . Atherosclerosis   . BPH (benign prostatic hyperplasia)   . Carotid arterial disease ([place])   . Charcot's joint of foot, right   . Chronic combined systolic (congestive) and diastolic (congestive) heart failure ([place])    a. 02/2013 EF 50% by LV gram; b. 04/2017 Echo: EF 25-30%. diff HK. Gr2 DD. Mod dil LA/RV. PASP 68mmHg.  [name] COPD (chronic obstructive pulmonary disease) ([place])   . Coronary artery disease    a. 2013 S/P PCI of LAD [facility]);  b.  03/2013 PCI: RCA 90p ( 2.5 x 23 mm DES); c. 02/2014 Cath: patent RCA stent->Med Rx; d. 04/2017 Cath: LM nl, LAd 30ost/p, 80m/d, D1 80ost, LCX 90p/m, OM1 85, RCA 70/20p, 48m, 70d->Referred for CT Surg-felt to be poor candidate.  . Diabetic neuropathy ([place])   . Diabetic ulcer of right foot ([place])   . Hernia   . Hyperlipidemia   . Hypertension   . Ischemic cardiomyopathy    a. 04/2017 Echo: EF 25-30%; b. 08/2017 Cardiac MRI ([facility]): EF 19%, sev glob HK. RVEF 20%, mod BAE, triv MR, mild-mod TR. Basal lateral subendocardial infarct (viable), inf/infsept, dist septal ischemia, basal to mid lat peri-infarct ischemia.  . Morbid obesity ([place])   . Neuropathy   . PAD (peripheral artery disease) ([place])    a. Followed by Dr. Lucky Cowboy; b. 08/2010 Periph Angio: RSFA 70-80p (6X60 self-expanding stent); c. 09/2010 Periph Angio: L SFA 100p (7x unknown length self-expanding stent); d. 06/2015 Periph Angio: R SFA short segment occlusion (6x12 self-expanding stent).  . Restless leg syndrome   . Sleep apnea   . Subclavian artery stenosis, right ([place])   . Syncope and collapse   . Tobacco use    a. 75+ yr hx - still smoking 1ppd, down from 2 ppd.  . Type II diabetes mellitus ([place])   . Varicose vein     Patient Active Problem List   Diagnosis Date Noted  . CHF exacerbation ([place]) 06/17/2020  . Chronic anticoagulation 06/17/2020  . Chronic venous insufficiency 02/10/2020  . HFrEF (heart failure with reduced  ejection fraction) ([place]) 01/30/2020  . COPD with acute exacerbation ([place]) 01/30/2020  . Persistent atrial fibrillation ([place]) 01/19/2020  . Chronic bilateral low back pain with sciatica 10/08/2019  . Medication management contract signed 10/08/2019  . Cellulitis of left lower leg 09/27/2019  . Cellulitis of foot, left 09/27/2019  . Chronic hyponatremia 09/27/2019  . Cellulitis of left leg 08/31/2019  . CAD (coronary artery disease) 08/31/2019  . Acute on chronic systolic CHF (congestive heart failure) ([place])  11/05/2018  . Open toe wound 10/01/2018  . Venous ulcer of left leg ([place]) 08/19/2018  . 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[place]) 04/28/2018  . Sepsis ([place]) 03/30/2018  . Abnormal MRI, shoulder (Right) 02/23/2018  . Abnormal MRI, cervical spine (2016) 02/23/2018  . DDD (degenerative disc disease), cervical 02/23/2018  . Cervical foraminal stenosis (Bilateral) 02/23/2018  . Cervical central spinal stenosis 02/23/2018  . Cervical facet hypertrophy 02/23/2018  . Arthralgia of acromioclavicular joint (Right) 02/23/2018  . Osteoarthritis of  AC (acromioclavicular) joint (Right) 02/23/2018  . Biceps tendinosis of shoulder (Right) 02/23/2018  . Tendinopathy of rotator cuff (Right) 02/23/2018  . Vitamin D deficiency 02/23/2018  . Atherosclerosis of native arteries of the extremities with ulceration ([place]) 02/15/2018  . Peripheral edema 02/05/2018  . Status post peripherally inserted central catheter (PICC) central line placement 02/04/2018  . Chronic lower extremity pain (Primary Area of Pain) (Right) 01/29/2018  . Chronic shoulder pain [facility] Area of Pain) (Right) 01/29/2018  . Chronic foot pain (Secondary Area of Pain) (Right) 01/29/2018  . Chronic pain syndrome 01/29/2018  . Long term current use of opiate analgesic 01/29/2018  . Medication monitoring encounter 01/29/2018  . Disorder of skeletal system 01/29/2018  . Problems influencing health status 01/29/2018  . AKI (acute kidney injury) ([place]) 01/03/2018  . NSTEMI (non-ST elevated myocardial infarction) ([place]) 11/21/2017  . Coronary artery disease of native artery of native heart with stable angina pectoris ([place]) 09/10/2017  . HLD (hyperlipidemia) 09/10/2017  . Angina pectoris ([place]) 09/08/2017  . Angina at rest [facility]) 08/13/2017  . Lymphedema 05/02/2017  . Chronic systolic CHF (congestive heart failure) ([place]) 04/23/2017  . Chest pain 07/11/2016  . Unstable angina ([place]) 07/11/2016  . Foot ulcer (Right)   . Stable angina  (HCC)   . Pressure injury of skin 05/06/2016  . Diabetic foot infection ([place]) 05/05/2016  . Type 2 diabetes mellitus with other specified complication ([place]) 09/73/5329  . Cellulitis 11/21/2015  . Morbid obesity ([place]) 11/17/2014  . PAD (peripheral artery disease) ([place])   . Hypertension   . Tobacco use   . COPD (chronic obstructive pulmonary disease) ([place]) 05/19/2012  . Diabetic neuropathy ([place]) 05/19/2012  . Peripheral vascular disease ([place]) 03/27/2012    Past Surgical History:  Procedure Laterality Date  . ABDOMINAL AORTAGRAM N/A 06/23/2013   Procedure: ABDOMINAL Maxcine [place];  Surgeon: Wellington Hampshire, MD;  Location: [place] CATH LAB;  Service: Cardiovascular;  Laterality: N/A;  . AMPUTATION Right 04/01/2018   Procedure: AMPUTATION BELOW KNEE;  Surgeon: Algernon Huxley, MD;  Location: [facility] ORS;  Service: Vascular;  Laterality: Right;  . APPENDECTOMY    . CARDIAC CATHETERIZATION  10/14   [facility]; X1 STENT PROXIMAL RCA  . CARDIAC CATHETERIZATION  02/2010   [facility]  . CARDIAC CATHETERIZATION  02/21/2014   [facility]  . CLAVICLE SURGERY    . HERNIA REPAIR    . IRRIGATION AND DEBRIDEMENT FOOT Right 01/04/2018   Procedure: IRRIGATION AND DEBRIDEMENT FOOT;  Surgeon: Samara Deist, DPM;  Location: [facility] ORS;  Service: Podiatry;  Laterality: Right;  . LEFT HEART CATH AND CORONARY ANGIOGRAPHY N/A 01/20/2020   Procedure: LEFT HEART CATH AND CORONARY ANGIOGRAPHY;  Surgeon: Wellington Hampshire, MD;  Location: [place] CV LAB;  Service: Cardiovascular;  Laterality: N/A;  . LEFT HEART CATH AND CORS/GRAFTS ANGIOGRAPHY N/A 04/28/2017   Procedure: LEFT HEART CATH AND CORONARY ANGIOGRAPHY;  Surgeon: Wellington Hampshire, MD;  Location: [place] CV LAB;  Service: Cardiovascular;  Laterality: N/A;  . LOWER EXTREMITY ANGIOGRAPHY Right 03/03/2018   Procedure: LOWER EXTREMITY ANGIOGRAPHY;  Surgeon: Katha Cabal, MD;  Location: [place] CV LAB;  Service: Cardiovascular;  Laterality: Right;  . LOWER EXTREMITY  ANGIOGRAPHY Left 08/24/2018   Procedure: LOWER EXTREMITY ANGIOGRAPHY;  Surgeon: Algernon Huxley, MD;  Location: [place] CV LAB;  Service: Cardiovascular;  Laterality: Left;  . LOWER EXTREMITY ANGIOGRAPHY Left 06/14/2019   Procedure: LOWER EXTREMITY ANGIOGRAPHY;  Surgeon: Algernon Huxley, MD;  Location: [place] CV LAB;  Service: Cardiovascular;  Laterality: Left;  . LOWER EXTREMITY ANGIOGRAPHY Left 08/02/2019   Procedure: LOWER EXTREMITY ANGIOGRAPHY;  Surgeon: Algernon Huxley, MD;  Location: [place] CV LAB;  Service: Cardiovascular;  Laterality: Left;  . PERIPHERAL ARTERIAL STENT GRAFT     x2 left/right  . PERIPHERAL VASCULAR BALLOON ANGIOPLASTY Right 01/06/2018   Procedure: PERIPHERAL VASCULAR BALLOON ANGIOPLASTY;  Surgeon: Katha Cabal, MD;  Location: [place] CV LAB;  Service: Cardiovascular;  Laterality: Right;  . PERIPHERAL VASCULAR CATHETERIZATION N/A 07/06/2015   Procedure: Abdominal Aortogram w/Lower Extremity;  Surgeon: Algernon Huxley, MD;  Location: [place] CV LAB;  Service: Cardiovascular;  Laterality: N/A;  . PERIPHERAL VASCULAR CATHETERIZATION  07/06/2015   Procedure: Lower Extremity Intervention;  Surgeon: Algernon Huxley, MD;  Location: [place] CV LAB;  Service: Cardiovascular;;    Prior to Admission medications   Medication Sig Start Date End Date Taking? Authorizing Provider  albuterol (VENTOLIN HFA) 108 (90 Base) MCG/ACT inhaler Inhale into the lungs every 6 (six) hours as needed for wheezing or shortness of breath.    [provider]  atorvastatin (LIPITOR) 80 MG tablet Take 80 mg by mouth daily.    [provider]  budesonide-formoterol (SYMBICORT) 80-4.5 MCG/ACT inhaler Inhale 2 puffs into the lungs as needed.    [provider]  clopidogrel (PLAVIX) 75 MG tablet Take 75 mg by mouth daily.  08/22/17   [provider]  collagenase (SANTYL) ointment Apply 1 application topically daily. 08/11/19   Kris Hartmann, NP   doxycycline (VIBRAMYCIN) 100 MG capsule Take 1 capsule (100 mg total) by mouth 2 (two) times daily. 05/22/20   Kris Hartmann, NP  DULoxetine (CYMBALTA) 20 MG capsule Take 30 mg by mouth daily.     [provider]  ELIQUIS 5 MG TABS tablet Take 5 mg by mouth daily.  02/02/20   [provider]  empagliflozin (JARDIANCE) 10 MG TABS tablet Take 10 mg by mouth daily.  02/03/20   [provider]  ezetimibe (ZETIA) 10 MG tablet Take 10 mg by mouth daily.     [provider]  gabapentin (NEURONTIN) 800 MG tablet Take 800 mg by mouth 4 (four) times daily.  08/14/17 04/04/29  [provider]  Ipratropium-Albuterol (COMBIVENT RESPIMAT) 20-100 MCG/ACT AERS respimat Inhale 1 puff into the lungs every 6 (six) hours as needed for wheezing.    [provider]  ipratropium-albuterol (DUONEB) 0.5-2.5 (3) MG/3ML SOLN Inhale 3 mLs into the lungs every 6 (six)  hours as needed (wheezing/sob).     [provider]  isosorbide mononitrate (IMDUR) 30 MG 24 hr tablet Take 30 mg by mouth daily.    [provider]  magnesium oxide (MAG-OX) 400 MG tablet Take 400 mg by mouth.  02/03/20   [provider]  metFORMIN (GLUCOPHAGE-XR) 500 MG 24 hr tablet Take 1,000 mg by mouth 2 (two) times daily.     [provider]  metolazone (ZAROXOLYN) 5 MG tablet Take 1 tablet (5 mg total) by mouth daily. Take 1 tablet today, Saturday & Sunday. Take additional potassium tablet those 3 days as well. 06/15/20 09/13/20  Alisa Graff, FNP  metoprolol succinate (TOPROL-XL) 25 MG 24 hr tablet Take 1 tablet (25 mg total) by mouth daily. 05/16/20   Minna Merritts, MD  mometasone-formoterol (DULERA) 200-5 MCG/ACT AERO Inhale 2 puffs into the lungs as needed.     [provider]  Multiple Vitamin (MULTI-VITAMINS) TABS Take 1 tablet by mouth daily.     [provider]  mupirocin ointment (BACTROBAN) 2 % SMARTSIG:1 Application Topical 2-3 Times Daily  03/13/20   [provider]  naloxone (NARCAN) 4 MG/0.1ML LIQD nasal spray kit Place into the nose as directed.  03/03/20   [provider]  nitroGLYCERIN (NITROSTAT) 0.4 MG SL tablet Place 1 tablet (0.4 mg total) under the tongue every 5 (five) minutes as needed for chest pain. 05/19/19   Alisa Graff, FNP  nystatin (MYCOSTATIN) 100000 UNIT/ML suspension Take 5 mLs by mouth 4 (four) times daily. 01/01/20   [provider]  omeprazole (PRILOSEC) 40 MG capsule Take 40 mg by mouth daily.    [provider]  Oxycodone HCl 10 MG TABS Take 10 mg by mouth every 6 (six) hours as needed (pain).  01/15/18   [provider]  pantoprazole (PROTONIX) 40 MG tablet Take 40 mg by mouth daily. 02/02/20   [provider]  potassium chloride (KLOR-CON) 10 MEQ tablet Take 20 mEq by mouth daily.    [provider]  promethazine (PHENERGAN) 25 MG tablet Take 25 mg by mouth every 6 (six) hours as needed.  01/01/20   [provider]  sodium chloride 1 g tablet Take 1 tablet (1 g total) by mouth 3 (three) times daily with meals. 06/12/20   Alisa Graff, FNP  SYMBICORT 80-4.5 MCG/ACT inhaler Inhale 2 puffs into the lungs daily.  01/01/20   [provider]  torsemide (DEMADEX) 20 MG tablet Take 2 tablets (40 mg total) by mouth 2 (two) times daily. Patient taking differently: Take 40 mg by mouth 2 (two) times daily. Taking $RemoveBefor'40mg'rWCswkTQBxPK$  AM/ $Re'20mg'WWM$  PM 05/16/20   Minna Merritts, MD  traZODone (DESYREL) 150 MG tablet Take 150 mg by mouth at bedtime. 07/12/19   [provider]  umeclidinium bromide (INCRUSE ELLIPTA) 62.5 MCG/INH AEPB Inhale 1 puff into the lungs daily.  02/03/20   [provider]  Vericiguat 5 MG TABS Take 5 mg by mouth daily.    [provider]    Allergies Dulaglutide, Other, and Prednisone  Family History  Problem Relation Age of Onset  . Heart attack Father   . Heart disease Father   . Hypertension Father   .  Hyperlipidemia Father   . Hypertension Mother   . Hyperlipidemia Mother     Social History Social History   Tobacco Use  . Smoking status: Current Every Day Smoker    Packs/day: 1.00    Years: 41.00  Pack years: 41.00    Types: Cigarettes  . Smokeless tobacco: Never Used  Vaping Use  . Vaping Use: Never used  Substance Use Topics  . Alcohol use: No  . Drug use: No    Review of Systems  Review of Systems  Constitutional: Positive for malaise/fatigue. Negative for chills and fever.  HENT: Negative for sore throat.   Eyes: Negative for pain.  Respiratory: Positive for cough and shortness of breath. Negative for stridor.   Cardiovascular: Negative for chest pain.  Gastrointestinal: Negative for vomiting.  Genitourinary: Negative for dysuria.  Musculoskeletal: Negative for myalgias.  Skin: Negative for rash.  Neurological: Positive for weakness. Negative for seizures, loss of consciousness and headaches.  Psychiatric/Behavioral: Negative for suicidal ideas.  All other systems reviewed and are negative.     ____________________________________________   PHYSICAL EXAM:  VITAL SIGNS: ED Triage Vitals [06/17/20 0036]  Enc Vitals Group     BP 117/80     Pulse Rate 100     Resp 20     Temp 98.3 F (36.8 C)     Temp Source Oral     SpO2 98 %     Weight 209 lb (94.8 kg)     Height $Remov'5\' 7"'qsGTVB$  (1.702 m)     Head Circumference      Peak Flow      Pain Score 6     Pain Loc      Pain Edu?      Excl. in [place]?    Vitals:   06/17/20 0036 06/17/20 0422  BP: 117/80 110/83  Pulse: 100 75  Resp: 20 (!) 118  Temp: 98.3 F (36.8 C) (!) 97.5 F (36.4 C)  SpO2: 98% 92%   Physical Exam Vitals and nursing note reviewed.  Constitutional:      Appearance: He is well-developed and well-nourished.  HENT:     Head: Normocephalic and atraumatic.     Right Ear: External ear normal.     Left Ear: External ear normal.     Nose: Nose normal.  Eyes:     Conjunctiva/sclera:  Conjunctivae normal.  Cardiovascular:     Rate and Rhythm: Tachycardia present. Rhythm irregular.     Heart sounds: No murmur heard.   Pulmonary:     Effort: Pulmonary effort is normal. No respiratory distress.     Breath sounds: Rales present.  Abdominal:     Palpations: Abdomen is soft.     Tenderness: There is no abdominal tenderness.  Musculoskeletal:     Cervical back: Neck supple.     Left lower leg: Edema present.     Right Lower Extremity: Right leg is amputated below knee.  Skin:    General: Skin is warm and dry.     Capillary Refill: Capillary refill takes less than 2 seconds.  Neurological:     Mental Status: He is alert and oriented to person, place, and time.  Psychiatric:        Mood and Affect: Mood and affect and mood normal.      ____________________________________________   LABS (all labs ordered are listed, but only abnormal results are displayed)  Labs Reviewed  CBC - Abnormal; Notable for the following components:      Result Value   RDW 21.9 (*)    All other components within normal limits  BRAIN NATRIURETIC PEPTIDE - Abnormal; Notable for the following components:   B Natriuretic Peptide 1,925.5 (*)    All other components within normal limits  COMPREHENSIVE METABOLIC PANEL - Abnormal; Notable for the following components:   Sodium 125 (*)    Potassium 5.2 (*)    Chloride 90 (*)    Glucose, Bld 124 (*)    BUN 34 (*)    Creatinine, Ser 1.50 (*)    AST 81 (*)    Total Bilirubin 3.7 (*)    GFR, Estimated 54 (*)    All other components within normal limits  TROPONIN I (HIGH SENSITIVITY) - Abnormal; Notable for the following components:   Troponin I (High Sensitivity) 39 (*)    All other components within normal limits  TROPONIN I (HIGH SENSITIVITY) - Abnormal; Notable for the following components:   Troponin I (High Sensitivity) 54 (*)    All other components within normal limits  SARS CORONAVIRUS 2 (TAT 6-24 HRS)  PROCALCITONIN    ____________________________________________  EKG  A. fib with a ventricular rate of 100, left axis deviation, right bundle branch block, and diffuse T wave inversions and ST changes in anterior and septal leads that are all largely present in prior EKGs.  There is also T wave depression and inversion in leads I and aVL that is present on prior EKGs.  No clear evidence of acute ischemia.  ____________________________________________  RADIOLOGY  ED MD interpretation: Bilateral pulmonary edema without pneumothorax, pleural consolidation, or large effusion.  Official radiology report(s): DG Chest 1 View  Result Date: 06/17/2020 CLINICAL DATA:  Shortness of breath and dizziness for 2 days, intermittent chest pain, bilateral lower extremity swelling, no fever EXAM: CHEST  1 VIEW COMPARISON:  Radiograph 01/19/2020, CT 08/30/2019 FINDINGS: Some increased hazy interstitial opacities are present towards the lung bases is central pulmonary vascular congestion, as well as fissural and septal thickening. No consolidative opacity. No pneumothorax or effusion. Cardiomegaly, similar to prior portable radiography. Slight increasing prominence of the central pulmonary arteries is noted. The aorta is calcified. The remaining cardiomediastinal contours are unremarkable. Degenerative changes are present in the imaged spine and shoulders. Remote deformity of the distal right clavicle. No acute osseous or soft tissue abnormality. IMPRESSION: 1. Findings consistent with CHF/volume overload with interstitial pulmonary edema and cardiomegaly. 2. Slight increasing prominence of the central pulmonary arteries, suspicious for pulmonary artery hypertension. Electronically Signed   By: Lovena Le M.D.   On: 06/17/2020 01:33    ____________________________________________   PROCEDURES  Procedure(s) performed (including Critical Care):  .1-3 Lead EKG Interpretation Performed by: Lucrezia Starch, MD Authorized by:  Lucrezia Starch, MD     Interpretation: abnormal     Rhythm: atrial fibrillation     Ectopy: none     Conduction: normal       ____________________________________________   INITIAL IMPRESSION / ASSESSMENT AND PLAN / ED COURSE      Patient presents with above to history exam for assessment of cough, shortness of breath and generalized weakness has been getting little bit worse over the last couple days associated with some missed doses of his Lasix.  On arrival patient is tachycardic with heart rate of 118 and otherwise with stable vital signs on room air.   Patient does appear grossly overloaded on exam has rales bilaterally.  Primary differential includes arrhythmia versus rapid A. fib, acute anemia, ACS, PE, pneumonia, heart failure exacerbation, and metabolic derangements.  While patient denies any chest pain his troponin is slightly elevated at 39.  However most recent comparisons 4 months ago were between 7000 and 15,000 and patient had an NSTEMI.  It Is possible patient  has some very mild demand ischemia at this time will have low suspicion for coronary artery thrombosis.  This is not consistent with acute myocarditis either.  CBC unremarkable.  No evidence of acute anemia or leukocytosis.  CMP remarkable for hyponatremia with an NA of 125 compared to 124 4 days ago, hyperkalemia with a K of 5.2, AKI with a creatinine of 1.5 the compared to 1.13 days ago as well as elevated T bili at 3.7.  AST is 81 and alk phos and ALT are unremarkable.  Pro-Cal is undetectable.  Unclear etiology for elevated bilirubin as patient denies any abdominal pain or GI symptoms and has no tenderness in his right upper quadrant or fever elevated white blood cell count to suggest acute cholecystitis, cholangeitis, or other cholestatic process.  Overall follows up with right upper quadrant ultrasound.  Impression is likely symptoms and borderline low SPO2 as well as slightly elevated heart rate and  above-noted metabolic derangements secondary to heart failure exacerbation.  Patient was also given DuoNeb given the stated the DuoNeb therapy at home slightly improved his wheezing and he has a history of ongoing tobacco abuse and COPD.  We will also give Lasix.  I will plan to admit to hospitalist service for further evaluation and management.     ____________________________________________   FINAL CLINICAL IMPRESSION(S) / ED DIAGNOSES  Final diagnoses:  Bilirubinemia  Acute on chronic heart failure, unspecified heart failure type ([place])  AKI (acute kidney injury) ([place])  Hyponatremia  Troponin I above reference range  Acute pulmonary edema (HCC)    Medications  ipratropium-albuterol (DUONEB) 0.5-2.5 (3) MG/3ML nebulizer solution 3 mL (has no administration in time range)  furosemide (LASIX) injection 80 mg (has no administration in time range)     ED Discharge Orders    None       Note:  This document was prepared using Dragon voice recognition software and may include unintentional dictation errors.   Lucrezia Starch, MD 06/17/20 [phone]

## 2020-06-17 NOTE — ED Notes (Signed)
Patient is resting comfortably. 

## 2020-06-17 NOTE — Consult Note (Signed)
[address] [place],  [address] Phone [phone]. Fax [phone]  Date: 06/17/2020                  Patient Name:  [name] [name]  MRN: [id number]  DOB: 1960/07/09  Age / Sex: 60 y.o., male         PCP: [name], MD                 Service Requesting Consult: IM/ Antonieta Pert, MD                 Reason for Consult: ARF            History of Present Illness: Patient is a 60 y.o. male  admitted to [facility] on 06/17/2020 for evaluation of CHF exacerbation [facility]) [I50.9]  Nephrology consult for AKI Patient has significant coronary artery disease, with history of LAD, RCA stent, last cath on August 2021 showing multivessel disease, chronic systolic CHF with EF less than 20%(01/20/2020), diabetes with neuropathy, peripheral arterial disease status post right BKA, atrial fibrillation on Eliquis, COPD, tobacco abuse Presented to the emergency room with shortness of breath, lower extremity edema Baseline creatinine of 1.10, presenting creatinine of 1.50 on January 8. Also noted to have low sodium of 125 No urinalysis or renal imaging  low BP and tachycardic Currently on room air  Medications: Outpatient medications: (Not in a hospital admission)   Current medications: Current Facility-Administered Medications  Medication Dose Route Frequency Provider Last Rate Last Admin  . 0.9 %  sodium chloride infusion  250 mL Intravenous PRN Athena Masse, MD      . acetaminophen (TYLENOL) tablet 650 mg  650 mg Oral Q4H PRN Athena Masse, MD      . ezetimibe (ZETIA) tablet 10 mg  10 mg Oral Daily Athena Masse, MD      . furosemide (LASIX) injection 60 mg  60 mg Intravenous BID Judd Gaudier V, MD      . furosemide (LASIX) injection 80 mg  80 mg Intramuscular Once Minna Merritts, MD      . insulin aspart (novoLOG) injection 0-20 Units  0-20 Units Subcutaneous Q4H Judd Gaudier V, MD      . metoprolol succinate (TOPROL-XL) 24 hr tablet 25 mg  25 mg Oral Daily Judd Gaudier V, MD      . nitroGLYCERIN (NITROSTAT) SL tablet 0.4 mg  0.4 mg Sublingual Q5 min PRN Athena Masse, MD      . ondansetron [facility]) injection 4 mg  4 mg Intravenous Q6H PRN Athena Masse, MD      . potassium chloride SA (KLOR-CON) CR tablet 40 mEq  40 mEq Oral Once Minna Merritts, MD      . sodium chloride flush (NS) 0.9 % injection 3 mL  3 mL Intravenous Q12H Judd Gaudier V, MD      . sodium chloride flush (NS) 0.9 % injection 3 mL  3 mL Intravenous PRN Athena Masse, MD       Current Outpatient Medications  Medication Sig Dispense Refill  . albuterol (VENTOLIN HFA) 108 (90 Base) MCG/ACT inhaler Inhale into the lungs every 6 (six) hours as needed for wheezing or shortness of breath.    [name] atorvastatin (LIPITOR) 80 MG tablet Take 80 mg by mouth daily.    . budesonide-formoterol (SYMBICORT) 80-4.5 MCG/ACT inhaler Inhale 2 puffs into the lungs as needed.    . clopidogrel (PLAVIX) 75 MG tablet  Take 75 mg by mouth daily.     . collagenase (SANTYL) ointment Apply 1 application topically daily. 90 g 1  . doxycycline (VIBRAMYCIN) 100 MG capsule Take 1 capsule (100 mg total) by mouth 2 (two) times daily. 20 capsule 0  . DULoxetine (CYMBALTA) 20 MG capsule Take 30 mg by mouth daily.     [name] ELIQUIS 5 MG TABS tablet Take 5 mg by mouth daily.     . empagliflozin (JARDIANCE) 10 MG TABS tablet Take 10 mg by mouth daily.     [name] ezetimibe (ZETIA) 10 MG tablet Take 10 mg by mouth daily.     [name] gabapentin (NEURONTIN) 800 MG tablet Take 800 mg by mouth 4 (four) times daily.     . Ipratropium-Albuterol (COMBIVENT RESPIMAT) 20-100 MCG/ACT AERS respimat Inhale 1 puff into the lungs every 6 (six) hours as needed for wheezing.    [name] ipratropium-albuterol (DUONEB) 0.5-2.5 (3) MG/3ML SOLN Inhale 3 mLs into the lungs every 6 (six) hours as needed (wheezing/sob).     . isosorbide mononitrate (IMDUR) 30 MG 24 hr tablet Take 30 mg by mouth daily.    . magnesium oxide (MAG-OX) 400 MG tablet Take 400 mg by mouth.      . metFORMIN (GLUCOPHAGE-XR) 500 MG 24 hr tablet Take 1,000 mg by mouth 2 (two) times daily.     . metolazone (ZAROXOLYN) 5 MG tablet Take 1 tablet (5 mg total) by mouth daily. Take 1 tablet today, Saturday & Sunday. Take additional potassium tablet those 3 days as well. 3 tablet 0  . metoprolol succinate (TOPROL-XL) 25 MG 24 hr tablet Take 1 tablet (25 mg total) by mouth daily. 90 tablet 3  . mometasone-formoterol (DULERA) 200-5 MCG/ACT AERO Inhale 2 puffs into the lungs as needed.     . Multiple Vitamin (MULTI-VITAMINS) TABS Take 1 tablet by mouth daily.     . mupirocin ointment (BACTROBAN) 2 % SMARTSIG:1 Application Topical 2-3 Times Daily    . naloxone (NARCAN) 4 MG/0.1ML LIQD nasal spray kit Place into the nose as directed.     . nitroGLYCERIN (NITROSTAT) 0.4 MG SL tablet Place 1 tablet (0.4 mg total) under the tongue every 5 (five) minutes as needed for chest pain. 30 tablet 5  . nystatin (MYCOSTATIN) 100000 UNIT/ML suspension Take 5 mLs by mouth 4 (four) times daily.    [name] omeprazole (PRILOSEC) 40 MG capsule Take 40 mg by mouth daily.    . Oxycodone HCl 10 MG TABS Take 10 mg by mouth every 6 (six) hours as needed (pain).     . pantoprazole (PROTONIX) 40 MG tablet Take 40 mg by mouth daily.    . potassium chloride (KLOR-CON) 10 MEQ tablet Take 20 mEq by mouth daily.    . promethazine (PHENERGAN) 25 MG tablet Take 25 mg by mouth every 6 (six) hours as needed.     . sodium chloride 1 g tablet Take 1 tablet (1 g total) by mouth 3 (three) times daily with meals. 90 tablet 3  . SYMBICORT 80-4.5 MCG/ACT inhaler Inhale 2 puffs into the lungs daily.     [name] torsemide (DEMADEX) 20 MG tablet Take 2 tablets (40 mg total) by mouth 2 (two) times daily. (Patient taking differently: Take 40 mg by mouth 2 (two) times daily. Taking $RemoveBefor'40mg'XbiiLfbqBumT$  AM/ $Re'20mg'BKf$  PM) 120 tablet 4  . traZODone (DESYREL) 150 MG tablet Take 150 mg by mouth at bedtime.    [name] umeclidinium bromide (INCRUSE ELLIPTA) 62.5 MCG/INH AEPB Inhale 1 puff  into the lungs daily.     . Vericiguat 5 MG TABS Take 5 mg by mouth daily.        Allergies: Allergies  Allergen Reactions  . Dulaglutide Anaphylaxis, Diarrhea and Hives  . Other Itching    Skin itching associated with nitro patch  . Prednisone Rash      Past Medical History: Past Medical History:  Diagnosis Date  . Arthritis   . Asthma   . Atherosclerosis   . BPH (benign prostatic hyperplasia)   . Carotid arterial disease ([place])   . Charcot's joint of foot, right   . Chronic combined systolic (congestive) and diastolic (congestive) heart failure ([place])    a. 02/2013 EF 50% by LV gram; b. 04/2017 Echo: EF 25-30%. diff HK. Gr2 DD. Mod dil LA/RV. PASP 45mmHg.  [name] COPD (chronic obstructive pulmonary disease) ([place])   . Coronary artery disease    a. 2013 S/P PCI of LAD [facility]);  b. 03/2013 PCI: RCA 90p ( 2.5 x 23 mm DES); c. 02/2014 Cath: patent RCA stent->Med Rx; d. 04/2017 Cath: LM nl, LAd 30ost/p, 44m/d, D1 80ost, LCX 90p/m, OM1 85, RCA 70/20p, 44m, 70d->Referred for CT Surg-felt to be poor candidate.  . Diabetic neuropathy ([place])   . Diabetic ulcer of right foot ([employer])   . Hernia   . Hyperlipidemia   . Hypertension   . Ischemic cardiomyopathy    a. 04/2017 Echo: EF 25-30%; b. 08/2017 Cardiac MRI ([facility]): EF 19%, sev glob HK. RVEF 20%, mod BAE, triv MR, mild-mod TR. Basal lateral subendocardial infarct (viable), inf/infsept, dist septal ischemia, basal to mid lat peri-infarct ischemia.  . Morbid obesity ([place])   . Neuropathy   . PAD (peripheral artery disease) ([place])    a. Followed by Dr. Lucky Cowboy; b. 08/2010 Periph Angio: RSFA 70-80p (6X60 self-expanding stent); c. 09/2010 Periph Angio: L SFA 100p (7x unknown length self-expanding stent); d. 06/2015 Periph Angio: R SFA short segment occlusion (6x12 self-expanding stent).  . Restless leg syndrome   . Sleep apnea   . Subclavian artery stenosis, right ([place])   . Syncope and collapse   . Tobacco use    a. 75+ yr hx - still smoking 1ppd, down from 2  ppd.  . Type II diabetes mellitus ([place])   . Varicose vein      Past Surgical History: Past Surgical History:  Procedure Laterality Date  . ABDOMINAL AORTAGRAM N/A 06/23/2013   Procedure: ABDOMINAL Maxcine Ham;  Surgeon: Wellington Hampshire, MD;  Location: [facility]ATH [facility];  Service: Cardiovascular;  Laterality: N/A;  . AMPUTATION Right 04/01/2018   Procedure: AMPUTATION BELOW KNEE;  Surgeon: Algernon Huxley, MD;  Location: [facility] ORS;  Service: Vascular;  Laterality: Right;  . APPENDECTOMY    . CARDIAC CATHETERIZATION  10/14   [facility]; X1 STENT PROXIMAL RCA  . CARDIAC CATHETERIZATION  02/2010   [facility]  . CARDIAC CATHETERIZATION  02/21/2014   [facility]  . CLAVICLE SURGERY    . HERNIA REPAIR    . IRRIGATION AND DEBRIDEMENT FOOT Right 01/04/2018   Procedure: IRRIGATION AND DEBRIDEMENT FOOT;  Surgeon: Samara Deist, DPM;  Location: [facility] ORS;  Service: Podiatry;  Laterality: Right;  . LEFT HEART CATH AND CORONARY ANGIOGRAPHY N/A 01/20/2020   Procedure: LEFT HEART CATH AND CORONARY ANGIOGRAPHY;  Surgeon: Wellington Hampshire, MD;  Location: [facility];  Service: Cardiovascular;  Laterality: N/A;  . LEFT HEART CATH AND CORS/GRAFTS ANGIOGRAPHY N/A 04/28/2017   Procedure: LEFT HEART CATH AND CORONARY ANGIOGRAPHY;  Surgeon:  Iran Ouch, MD;  Location: [facility] INVASIVE CV LAB;  Service: Cardiovascular;  Laterality: N/A;  . LOWER EXTREMITY ANGIOGRAPHY Right 03/03/2018   Procedure: LOWER EXTREMITY ANGIOGRAPHY;  Surgeon: Renford Dills, MD;  Location: [facility] INVASIVE CV LAB;  Service: Cardiovascular;  Laterality: Right;  . LOWER EXTREMITY ANGIOGRAPHY Left 08/24/2018   Procedure: LOWER EXTREMITY ANGIOGRAPHY;  Surgeon: Annice Needy, MD;  Location: [facility] INVASIVE [facility];  Service: Cardiovascular;  Laterality: Left;  . LOWER EXTREMITY ANGIOGRAPHY Left 06/14/2019   Procedure: LOWER EXTREMITY ANGIOGRAPHY;  Surgeon: Annice Needy, MD;  Location: [facility] INV[facility];  Service: Cardiovascular;  Laterality: Left;  . LOWER  EXTREMITY ANGIOGRAPHY Left 08/02/2019   Procedure: LOWER EXTREMITY ANGIOGRAPHY;  Surgeon: Annice Needy, MD;  Location: [facility];  Service: Cardiovascular;  Laterality: Left;  . PERIPHERAL ARTERIAL STENT GRAFT     x2 left/right  . PERIPHERAL VASCULAR BALLOON ANGIOPLASTY Right 01/06/2018   Procedure: PERIPHERAL VASCULAR BALLOON ANGIOPLASTY;  Surgeon: Renford Dills, MD;  Location: [facility] INVASIVE [facility];  Service: Cardiovascular;  Laterality: Right;  . PERIPHERAL VASCULAR CATHETERIZATION N/A 07/06/2015   Procedure: Abdominal Aortogram w/Lower Extremity;  Surgeon: Annice Needy, MD;  Location: [facility] INVAS[facility];  Service: Cardiovascular;  Laterality: N/A;  . PERIPHERAL VASCULAR CATHETERIZATION  07/06/2015   Procedure: Lower Extremity Intervention;  Surgeon: Annice Needy, MD;  Location: [facility] INV[facility] CV [facility];  Service: Cardiovascular;;     Family History: Family History  Problem Relation Age of Onset  . Heart attack Father   . Heart disease Father   . Hypertension Father   . Hyperlipidemia Father   . Hypertension Mother   . Hyperlipidemia Mother      Social History: Social History   Socioeconomic History  . Marital status: Single    Spouse name: Not on file  . Number of children: 0  . Years of education: 27  . Highest education level: 12th grade  Occupational History  . Occupation: on disability  Tobacco Use  . Smoking status: Current Every Day Smoker    Packs/day: 1.00    Years: 41.00    Pack years: 41.00    Types: Cigarettes  . Smokeless tobacco: Never Used  Vaping Use  . Vaping Use: Never used  Substance and Sexual Activity  . Alcohol use: No  . Drug use: No  . Sexual activity: Yes  Other Topics Concern  . Not on file  Social History Narrative   Lives at home in [place] with girlfriend. Independent at baseline.   Social Determinants of Health   Financial Resource Strain: Not on file  Food Insecurity: Not on file  Transportation Needs: Not on file   Physical Activity: Not on file  Stress: Not on file  Social Connections: Not on file  Intimate Partner Violence: Not on file     Review of Systems: Gen: Generalized malaise and weakness HEENT: No complaints about vision or hearing CV: Worsening lower extremity edema Resp: Worsening shortness of breath.  No sputum.  No fever GI: Appetite is good GU : Reports difficulty voiding sometimes MS: Has right leg prosthesis Derm:    Congestive changes in the left leg Psych: No complaints Heme: No complaint Neuro: No complaints Endocrine.  Diabetes.  Vital Signs: Blood pressure 92/66, pulse (!) 101, temperature (!) 97.5 F (36.4 C), temperature source Oral, resp. rate 15, height 5\' 7"  (1.702 m), weight 94.8 kg, SpO2 93 %.   Intake/Output Summary (Last 24 hours) at  06/17/2020 0845 Last data filed at 06/17/2020 0756 Gross per 24 hour  Intake --  Output 400 ml  Net -400 ml    Weight trends: [employer]   06/17/20 0036  Weight: 94.8 kg    Physical Exam: General:  Laying in the bed, no acute distress  HEENT  moist oral mucous membranes, anicteric  Neck:  Supple, no masses  Lungs:  Mild basilar crackles, room air  Heart::  Irregular, tachycardic, 2/6 systolic murmur  Abdomen:  Distended, ascites  Extremities:  Tight lower extremity edema in the left up to abdomen, right BKA  Neurologic:  Alert and oriented  Skin:  Congestive changes in the left leg    Lab results: Basic Metabolic Panel: Recent Labs  Lab 06/12/20 1327 06/14/20 1413 06/17/20 0130  NA 126* 124* 125*  K 4.6 4.6 5.2*  CL 89* 89* 90*  CO2 $Re'29 27 25  'Gwc$ GLUCOSE 115* 113* 124*  BUN 20 24* 34*  CREATININE 1.13 1.10 1.50*  CALCIUM 9.0 9.0 9.1    Liver Function Tests: Recent Labs  Lab 06/17/20 0130  AST 81*  ALT 37  ALKPHOS 114  BILITOT 3.7*  PROT 7.0  ALBUMIN 3.8   No results for input(s): LIPASE, AMYLASE in the last 168 hours. No results for input(s): AMMONIA in the last 168 hours.  CBC: Recent  Labs  Lab 06/17/20 0049  WBC 8.3  HGB 13.1  HCT 40.3  MCV 82.6  PLT 184    Cardiac Enzymes: No results for input(s): CKTOTAL, TROPONINI in the last 168 hours.  BNP: Invalid input(s): POCBNP  CBG: Recent Labs  Lab 06/17/20 6754  GBEEFE 07    Microbiology: No results found for this or any previous visit (from the past 720 hour(s)).   Coagulation Studies: No results for input(s): LABPROT, INR in the last 72 hours.  Urinalysis: No results for input(s): COLORURINE, LABSPEC, PHURINE, GLUCOSEU, HGBUR, BILIRUBINUR, KETONESUR, PROTEINUR, UROBILINOGEN, NITRITE, LEUKOCYTESUR in the last 72 hours.  Invalid input(s): APPERANCEUR      Imaging: DG Chest 1 View  Result Date: 06/17/2020 CLINICAL DATA:  Shortness of breath and dizziness for 2 days, intermittent chest pain, bilateral lower extremity swelling, no fever EXAM: CHEST  1 VIEW COMPARISON:  Radiograph 01/19/2020, CT 08/30/2019 FINDINGS: Some increased hazy interstitial opacities are present towards the lung bases is central pulmonary vascular congestion, as well as fissural and septal thickening. No consolidative opacity. No pneumothorax or effusion. Cardiomegaly, similar to prior portable radiography. Slight increasing prominence of the central pulmonary arteries is noted. The aorta is calcified. The remaining cardiomediastinal contours are unremarkable. Degenerative changes are present in the imaged spine and shoulders. Remote deformity of the distal right clavicle. No acute osseous or soft tissue abnormality. IMPRESSION: 1. Findings consistent with CHF/volume overload with interstitial pulmonary edema and cardiomegaly. 2. Slight increasing prominence of the central pulmonary arteries, suspicious for pulmonary artery hypertension. Electronically Signed   By: Lovena Le M.D.   On: 06/17/2020 01:33   US ABDOMEN LIMITED RUQ (LIVER/GB)  Result Date: 06/17/2020 CLINICAL DATA:  Hyperbilirubinemia EXAM: ULTRASOUND ABDOMEN LIMITED RIGHT  UPPER QUADRANT COMPARISON:  03/07/2014 abdominal CT FINDINGS: Gallbladder: Gallbladder wall thickening to 4 mm.  No stone or focal tenderness. Common bile duct: Diameter: 7 mm. Liver: No focal lesion identified. Within normal limits in parenchymal echogenicity. Nodular appearance of the liver surface. Portal vein is patent on color Doppler imaging with normal direction of blood flow towards the liver. Portal vein flow is pulsatile, presumably from cirrhosis,  but possibly also related to right heart failure given chest CT findings March 2021. Other: Small volume ascites. IMPRESSION: 1. Cirrhotic appearing liver surface with small volume ascites and pulsatile portal vein flow. 2. Thickened gallbladder without calculus, considered reactive. Electronically Signed   By: Monte Fantasia M.D.   On: 06/17/2020 05:46      Assessment & Plan: Pt is a 60 y.o.   male with , was admitted on 06/17/2020 with CHF exacerbation ([place]) [I50.9]   1. AKI with volume overload 2. Hyponatremia 3.  Acute exacerbation of chronic Systolic CHF 4. Cirrhosis 5.  Hypotension 6.  Atrial fibrillation  Patient presents with volume overload, exacerbation of chronic systolic CHF.  Diuresis is limited because of low blood pressure and atrial fibrillation. Last 2D echo from January 20, 2020 shows severely decreased LV function with global hypokinesis moderately enlarged right ventricle, moderate tricuspid regurgitation  Plan: Obtain urinalysis We will place patient on IV Lasix infusion Currently Albumin is 3.8, if goes lower, will consider IV albumin supplementation Start midodrine for higher blood pressures Flomax for urinary hesitancy Avoid hypotension We will follow      LOS: 0 Hanan Mcwilliams 1/8/20228:45 AM    Note: This note was prepared with Dragon dictation. Any transcription errors are unintentional

## 2020-06-17 NOTE — ED Notes (Signed)
Patient is resting comfortably. Medications administered

## 2020-06-17 NOTE — H&P (Addendum)
History and Physical    [name] [name] VFI:[id number] DOB: 1961/02/07 DOA: 06/17/2020  PCP: [name], MD   Patient coming from: Home  I have personally briefly reviewed patient's old medical records in [place]  Chief Complaint: Weakness, shortness of breath,   HPI: [name] [name] is a 60 y.o. male with medical history significant for CAD with LAD stent last cath 01/20/2020 showing multivessel disease not amenable to stenting, systolic heart failure last EF less than 20% 01/20/2020, diabetes with neuropathy, PAD status post right BKA, persistent A. fib on Eliquis, COPD tobacco abuse, presents to the emergency room with shortness of breath, fluid retention in his legs and intermittent chest pain.  He uses albuterol which helped a bit but continues to have dyspnea with exertion.  He denies cough, fever or chills ED Course: On arrival, vitals were within normal limits.  Blood work significant for BNP 1925, troponin 39>54 and creatinine 1.5 above baseline of 0.88.  Sodium 125 which appears to be chronic over the past several weeks and chart review. EKG as reviewed by me : A. fib with ventricular rate of 100, RBBB unchanged from prior EKGs Imaging: Findings consistent with CHF/volume overload  Patient treated with IV Lasix.  Also given a DuoNeb.  Hospitalist consulted for admission.  Review of Systems: As per HPI otherwise all other systems on review of systems negative.    Past Medical History:  Diagnosis Date  . Arthritis   . Asthma   . Atherosclerosis   . BPH (benign prostatic hyperplasia)   . Carotid arterial disease ([place])   . Charcot's joint of foot, right   . Chronic combined systolic (congestive) and diastolic (congestive) heart failure ([place])    a. 02/2013 EF 50% by LV gram; b. 04/2017 Echo: EF 25-30%. diff HK. Gr2 DD. Mod dil LA/RV. PASP 2mmHg.  [name] COPD (chronic obstructive pulmonary disease) ([place])   . Coronary artery disease    a. 2013 S/P PCI of LAD [facility]);  b.  03/2013 PCI: RCA 90p ( 2.5 x 23 mm DES); c. 02/2014 Cath: patent RCA stent->Med Rx; d. 04/2017 Cath: LM nl, LAd 30ost/p, 58m/d, D1 80ost, LCX 90p/m, OM1 85, RCA 70/20p, 29m, 70d->Referred for CT Surg-felt to be poor candidate.  . Diabetic neuropathy ([place])   . Diabetic ulcer of right foot ([place])   . Hernia   . Hyperlipidemia   . Hypertension   . Ischemic cardiomyopathy    a. 04/2017 Echo: EF 25-30%; b. 08/2017 Cardiac MRI ([facility]): EF 19%, sev glob HK. RVEF 20%, mod BAE, triv MR, mild-mod TR. Basal lateral subendocardial infarct (viable), inf/infsept, dist septal ischemia, basal to mid lat peri-infarct ischemia.  . Morbid obesity ([place])   . Neuropathy   . PAD (peripheral artery disease) ([place])    a. Followed by Dr. Lucky Cowboy; b. 08/2010 Periph Angio: RSFA 70-80p (6X60 self-expanding stent); c. 09/2010 Periph Angio: L SFA 100p (7x unknown length self-expanding stent); d. 06/2015 Periph Angio: R SFA short segment occlusion (6x12 self-expanding stent).  . Restless leg syndrome   . Sleep apnea   . Subclavian artery stenosis, right ([place])   . Syncope and collapse   . Tobacco use    a. 75+ yr hx - still smoking 1ppd, down from 2 ppd.  . Type II diabetes mellitus ([place])   . Varicose vein     Past Surgical History:  Procedure Laterality Date  . ABDOMINAL AORTAGRAM N/A 06/23/2013   Procedure: ABDOMINAL Maxcine Ham;  Surgeon: Mertie Clause  Fletcher Anon, MD;  Location: [facility] CATH LAB;  Service: Cardiovascular;  Laterality: N/A;  . AMPUTATION Right 04/01/2018   Procedure: AMPUTATION BELOW KNEE;  Surgeon: Algernon Huxley, MD;  Location: [facility] ORS;  Service: Vascular;  Laterality: Right;  . APPENDECTOMY    . CARDIAC CATHETERIZATION  10/14   [facility]; X1 STENT PROXIMAL RCA  . CARDIAC CATHETERIZATION  02/2010   [facility]  . CARDIAC CATHETERIZATION  02/21/2014   [facility]  . CLAVICLE SURGERY    . HERNIA REPAIR    . IRRIGATION AND DEBRIDEMENT FOOT Right 01/04/2018   Procedure: IRRIGATION AND DEBRIDEMENT FOOT;  Surgeon: Samara Deist, DPM;  Location:  [facility] ORS;  Service: Podiatry;  Laterality: Right;  . LEFT HEART CATH AND CORONARY ANGIOGRAPHY N/A 01/20/2020   Procedure: LEFT HEART CATH AND CORONARY ANGIOGRAPHY;  Surgeon: Wellington Hampshire, MD;  Location: [facility] CV LAB;  Service: Cardiovascular;  Laterality: N/A;  . LEFT HEART CATH AND CORS/GRAFTS ANGIOGRAPHY N/A 04/28/2017   Procedure: LEFT HEART CATH AND CORONARY ANGIOGRAPHY;  Surgeon: Wellington Hampshire, MD;  Location: [place] CV LAB;  Service: Cardiovascular;  Laterality: N/A;  . LOWER EXTREMITY ANGIOGRAPHY Right 03/03/2018   Procedure: LOWER EXTREMITY ANGIOGRAPHY;  Surgeon: Katha Cabal, MD;  Location: [place] CV LAB;  Service: Cardiovascular;  Laterality: Right;  . LOWER EXTREMITY ANGIOGRAPHY Left 08/24/2018   Procedure: LOWER EXTREMITY ANGIOGRAPHY;  Surgeon: Algernon Huxley, MD;  Location: [place] CV LAB;  Service: Cardiovascular;  Laterality: Left;  . LOWER EXTREMITY ANGIOGRAPHY Left 06/14/2019   Procedure: LOWER EXTREMITY ANGIOGRAPHY;  Surgeon: Algernon Huxley, MD;  Location: [place] CV LAB;  Service: Cardiovascular;  Laterality: Left;  . LOWER EXTREMITY ANGIOGRAPHY Left 08/02/2019   Procedure: LOWER EXTREMITY ANGIOGRAPHY;  Surgeon: Algernon Huxley, MD;  Location: [place] CV LAB;  Service: Cardiovascular;  Laterality: Left;  . PERIPHERAL ARTERIAL STENT GRAFT     x2 left/right  . PERIPHERAL VASCULAR BALLOON ANGIOPLASTY Right 01/06/2018   Procedure: PERIPHERAL VASCULAR BALLOON ANGIOPLASTY;  Surgeon: Katha Cabal, MD;  Location: [place] [facility];  Service: Cardiovascular;  Laterality: Right;  . PERIPHERAL VASCULAR CATHETERIZATION N/A 07/06/2015   Procedure: Abdominal Aortogram w/Lower Extremity;  Surgeon: Algernon Huxley, MD;  Location: [place] [facility];  Service: Cardiovascular;  Laterality: N/A;  . PERIPHERAL VASCULAR CATHETERIZATION  07/06/2015   Procedure: Lower Extremity Intervention;  Surgeon: Algernon Huxley, MD;  Location: [place] CV LAB;   Service: Cardiovascular;;     reports that he has been smoking cigarettes. He has a 41.00 pack-year smoking history. He has never used smokeless tobacco. He reports that he does not drink alcohol and does not use drugs.  Allergies  Allergen Reactions  . Dulaglutide Anaphylaxis, Diarrhea and Hives  . Other Itching    Skin itching associated with nitro patch  . Prednisone Rash    Family History  Problem Relation Age of Onset  . Heart attack Father   . Heart disease Father   . Hypertension Father   . Hyperlipidemia Father   . Hypertension Mother   . Hyperlipidemia Mother       Prior to Admission medications   Medication Sig Start Date End Date Taking? Authorizing Provider  albuterol (VENTOLIN HFA) 108 (90 Base) MCG/ACT inhaler Inhale into the lungs every 6 (six) hours as needed for wheezing or shortness of breath.    [provider]  atorvastatin (LIPITOR) 80 MG tablet Take 80 mg by mouth daily.    [provider]  budesonide-formoterol (SYMBICORT) 80-4.5 MCG/ACT inhaler Inhale 2 puffs into the lungs as needed.    [provider]  clopidogrel (PLAVIX) 75 MG tablet Take 75 mg by mouth daily.  08/22/17   [provider]  collagenase (SANTYL) ointment Apply 1 application topically daily. 08/11/19   Georgiana Spinner, NP  doxycycline (VIBRAMYCIN) 100 MG capsule Take 1 capsule (100 mg total) by mouth 2 (two) times daily. 05/22/20   Georgiana Spinner, NP  DULoxetine (CYMBALTA) 20 MG capsule Take 30 mg by mouth daily.     [provider]  ELIQUIS 5 MG TABS tablet Take 5 mg by mouth daily.  02/02/20   [provider]  empagliflozin (JARDIANCE) 10 MG TABS tablet Take 10 mg by mouth daily.  02/03/20   [provider]  ezetimibe (ZETIA) 10 MG tablet Take 10 mg by mouth daily.     [provider]  gabapentin (NEURONTIN) 800 MG tablet Take 800 mg by mouth 4 (four) times daily.  08/14/17 04/04/29  [provider]   Ipratropium-Albuterol (COMBIVENT RESPIMAT) 20-100 MCG/ACT AERS respimat Inhale 1 puff into the lungs every 6 (six) hours as needed for wheezing.    [provider]  ipratropium-albuterol (DUONEB) 0.5-2.5 (3) MG/3ML SOLN Inhale 3 mLs into the lungs every 6 (six) hours as needed (wheezing/sob).     [provider]  isosorbide mononitrate (IMDUR) 30 MG 24 hr tablet Take 30 mg by mouth daily.    [provider]  magnesium oxide (MAG-OX) 400 MG tablet Take 400 mg by mouth.  02/03/20   [provider]  metFORMIN (GLUCOPHAGE-XR) 500 MG 24 hr tablet Take 1,000 mg by mouth 2 (two) times daily.     [provider]  metolazone (ZAROXOLYN) 5 MG tablet Take 1 tablet (5 mg total) by mouth daily. Take 1 tablet today, Saturday & Sunday. Take additional potassium tablet those 3 days as well. 06/15/20 09/13/20  Delma Freeze, FNP  metoprolol succinate (TOPROL-XL) 25 MG 24 hr tablet Take 1 tablet (25 mg total) by mouth daily. 05/16/20   Antonieta Iba, MD  mometasone-formoterol (DULERA) 200-5 MCG/ACT AERO Inhale 2 puffs into the lungs as needed.     [provider]  Multiple Vitamin (MULTI-VITAMINS) TABS Take 1 tablet by mouth daily.     [provider]  mupirocin ointment (BACTROBAN) 2 % SMARTSIG:1 Application Topical 2-3 Times Daily 03/13/20   [provider]  naloxone (NARCAN) 4 MG/0.1ML LIQD nasal spray kit Place into the nose as directed.  03/03/20   [provider]  nitroGLYCERIN (NITROSTAT) 0.4 MG SL tablet Place 1 tablet (0.4 mg total) under the tongue every 5 (five) minutes as needed for chest pain. 05/19/19   Delma Freeze, FNP  nystatin (MYCOSTATIN) 100000 UNIT/ML suspension Take 5 mLs by mouth 4 (four) times daily. 01/01/20   [provider]  omeprazole (PRILOSEC) 40 MG capsule Take 40 mg by mouth daily.    [provider]  Oxycodone HCl 10 MG TABS Take 10 mg by mouth every 6 (six) hours as needed (pain).  01/15/18    [provider]  pantoprazole (PROTONIX) 40 MG tablet Take 40 mg by mouth daily. 02/02/20   [provider]  potassium chloride (KLOR-CON) 10 MEQ tablet Take 20 mEq by mouth daily.    [provider]  promethazine (PHENERGAN) 25 MG tablet Take 25 mg by mouth every 6 (six) hours as needed.  01/01/20   [provider]  sodium chloride 1 g tablet Take 1 tablet (1 g total) by mouth 3 (three) times daily with meals. 06/12/20   Alisa Graff, FNP  SYMBICORT 80-4.5 MCG/ACT inhaler Inhale 2 puffs into the lungs daily.  01/01/20   [provider]  torsemide (DEMADEX) 20 MG tablet Take 2 tablets (40 mg total) by mouth 2 (two) times daily. Patient taking differently: Take 40 mg by mouth 2 (two) times daily. Taking $RemoveBefor'40mg'QXANbUVelcFH$  AM/ $Re'20mg'nKJ$  PM 05/16/20   Minna Merritts, MD  traZODone (DESYREL) 150 MG tablet Take 150 mg by mouth at bedtime. 07/12/19   [provider]  umeclidinium bromide (INCRUSE ELLIPTA) 62.5 MCG/INH AEPB Inhale 1 puff into the lungs daily.  02/03/20   [provider]  Vericiguat 5 MG TABS Take 5 mg by mouth daily.    [provider]    Physical Exam: Vitals:   06/17/20 0036 06/17/20 0422  BP: 117/80 110/83  Pulse: 100 75  Resp: 20 (!) 118  Temp: 98.3 F (36.8 C) (!) 97.5 F (36.4 C)  TempSrc: Oral Oral  SpO2: 98% 92%  Weight: 94.8 kg   Height: $Remove'5\' 7"'kXzqSio$  (1.702 m)      Vitals:   06/17/20 0036 06/17/20 0422  BP: 117/80 110/83  Pulse: 100 75  Resp: 20 (!) 118  Temp: 98.3 F (36.8 C) (!) 97.5 F (36.4 C)  TempSrc: Oral Oral  SpO2: 98% 92%  Weight: 94.8 kg   Height: $Remove'5\' 7"'kGvujjS$  (1.702 m)       Constitutional: Alert and oriented x 3 . Not in any apparent distress HEENT:      Head: Normocephalic and atraumatic.         Eyes: PERLA, EOMI, Conjunctivae are normal. Sclera is non-icteric.       Mouth/Throat: Mucous membranes are moist.       Neck: Supple with no signs of meningismus. Cardiovascular:  Tachycardia,  irregular. No murmurs, gallops, or rubs. 2+ symmetrical distal pulses are present . No JVD.  2+LE edema Respiratory: Respiratory effort increased.Lungs sounds bibasilar rales gastrointestinal: Soft, non tender, and non distended with positive bowel sounds.  Genitourinary: No CVA tenderness. Musculoskeletal:  Right BKA nontender with normal range of motion in all extremities. No cyanosis, or erythema of extremities. Neurologic:  Face is symmetric. Moving all extremities. No gross focal neurologic deficits . Skin: Skin is warm, dry.  No rash or ulcers Psychiatric: Mood and affect are normal    Labs on Admission: I have personally reviewed following labs and imaging studies  CBC: Recent Labs  Lab 06/17/20 0049  WBC 8.3  HGB 13.1  HCT 40.3  MCV 82.6  PLT 161   Basic Metabolic Panel: Recent Labs  Lab 06/12/20 1327 06/14/20 1413 06/17/20 0130  NA 126* 124* 125*  K 4.6 4.6 5.2*  CL 89* 89* 90*  CO2 $Re'29 27 25  'PIW$ GLUCOSE 115* 113* 124*  BUN 20 24* 34*  CREATININE 1.13 1.10 1.50*  CALCIUM 9.0 9.0 9.1   GFR: Estimated Creatinine Clearance: 59.6 mL/min (A) (by C-G formula based on SCr of 1.5 mg/dL (H)). Liver Function Tests: Recent Labs  Lab 06/17/20 0130  AST 81*  ALT 37  ALKPHOS 114  BILITOT 3.7*  PROT 7.0  ALBUMIN 3.8   No results for input(s): LIPASE, AMYLASE in the last 168 hours. No results for input(s): AMMONIA in the last 168 hours. Coagulation Profile: No results for input(s): INR, PROTIME in the last 168 hours. Cardiac Enzymes: No results for input(s): CKTOTAL,  CKMB, CKMBINDEX, TROPONINI in the last 168 hours. BNP (last 3 results) No results for input(s): PROBNP in the last 8760 hours. HbA1C: No results for input(s): HGBA1C in the last 72 hours. CBG: No results for input(s): GLUCAP in the last 168 hours. Lipid Profile: No results for input(s): CHOL, HDL, LDLCALC, TRIG, CHOLHDL, LDLDIRECT in the last 72 hours. Thyroid Function Tests: No results for input(s):  TSH, T4TOTAL, FREET4, T3FREE, THYROIDAB in the last 72 hours. Anemia Panel: No results for input(s): VITAMINB12, FOLATE, FERRITIN, TIBC, IRON, RETICCTPCT in the last 72 hours. Urine analysis:    Component Value Date/Time   COLORURINE STRAW (A) 09/10/2019 2028   APPEARANCEUR CLEAR (A) 09/10/2019 2028   APPEARANCEUR Clear 02/06/2014 1412   LABSPEC 1.003 (L) 09/10/2019 2028   LABSPEC 1.015 02/06/2014 1412   PHURINE 6.0 09/10/2019 2028   GLUCOSEU NEGATIVE 09/10/2019 2028   GLUCOSEU Negative 02/06/2014 1412   HGBUR NEGATIVE 09/10/2019 2028   BILIRUBINUR NEGATIVE 09/10/2019 2028   BILIRUBINUR Negative 02/06/2014 1412   KETONESUR NEGATIVE 09/10/2019 2028   PROTEINUR NEGATIVE 09/10/2019 2028   NITRITE NEGATIVE 09/10/2019 2028   LEUKOCYTESUR NEGATIVE 09/10/2019 2028   LEUKOCYTESUR Negative 02/06/2014 1412    Radiological Exams on Admission: DG Chest 1 View  Result Date: 06/17/2020 CLINICAL DATA:  Shortness of breath and dizziness for 2 days, intermittent chest pain, bilateral lower extremity swelling, no fever EXAM: CHEST  1 VIEW COMPARISON:  Radiograph 01/19/2020, CT 08/30/2019 FINDINGS: Some increased hazy interstitial opacities are present towards the lung bases is central pulmonary vascular congestion, as well as fissural and septal thickening. No consolidative opacity. No pneumothorax or effusion. Cardiomegaly, similar to prior portable radiography. Slight increasing prominence of the central pulmonary arteries is noted. The aorta is calcified. The remaining cardiomediastinal contours are unremarkable. Degenerative changes are present in the imaged spine and shoulders. Remote deformity of the distal right clavicle. No acute osseous or soft tissue abnormality. IMPRESSION: 1. Findings consistent with CHF/volume overload with interstitial pulmonary edema and cardiomegaly. 2. Slight increasing prominence of the central pulmonary arteries, suspicious for pulmonary artery hypertension. Electronically  Signed   By: Lovena Le M.D.   On: 06/17/2020 01:33     Assessment/Plan 60 year old male with history of CAD with LAD stent last cath 01/20/2020 showing multivessel disease not amenable to stenting, systolic heart failure last EF less than 20% 01/20/2020, diabetes with neuropathy, PAD status post right BKA, persistent A. fib on Eliquis, COPD tobacco abuse, presents  with shortness of breath, fluid retention     Acute on chronic systolic CHF (congestive heart failure) ([place]) - EF less than 20% in August 2021 - IV Lasix.  Continue home beta-blocker.  Not currently on ACE/ARB.  I will hold metolazone for now - Daily weights, intake and output monitoring - Repeat echocardiogram - Cardiology consult    AKI (acute kidney injury) ([place]) - Likely prerenal medication related to Lasix metolazone use - Continue to monitor.  Creatinine 1.5 up from baseline of 0.88    Chronic hyponatremia - Suspect hypovolemic related to fluid overload - Treat CHF and continue to monitor    Coronary artery disease of native artery of native heart with stable angina pectoris (HCC) - CAD with LAD stent last cath 01/20/2020 showing patent stent ,multivessel disease not amenable to stenting --Slight troponin elevation from 39-54 but without complaints of chest pain.  EKG with ST-T wave changes but unchanged from priors - Continue beta-blocker, statin antiplatelet    Hypertension - Continue home antihypertensives  Type 2 diabetes mellitus with other specified complication (HCC) - Sliding scale insulin coverage    COPD (chronic obstructive pulmonary disease) ([place]) - Continue home inhalers.  DuoNeb as needed    Long term current use of opiate analgesic - Resume home opiates pending med rec    Persistent atrial fibrillation (HCC) - Resume Eliquis.  Continue metoprolol  DVT prophylaxis: Eliquis Code Status: full code  Family Communication:  none  Disposition Plan: Back to previous home environment Consults  called: Cardiology Status:At the time of admission, it appears that the appropriate admission status for this patient is INPATIENT. This is judged to be reasonable and necessary in order to provide the required intensity of service to ensure the patient's safety given the presenting symptoms, physical exam findings, and initial radiographic and laboratory data in the context of their  Comorbid conditions.   Patient requires inpatient status due to high intensity of service, high risk for further deterioration and high frequency of surveillance required.   I certify that at the point of admission it is my clinical judgment that the patient will require inpatient hospital care spanning beyond [place] MD [facility]     06/17/2020, 5:09 AM

## 2020-06-18 ENCOUNTER — Inpatient Hospital Stay: Payer: Medicare Other

## 2020-06-18 ENCOUNTER — Encounter: Payer: Self-pay | Admitting: Internal Medicine

## 2020-06-18 DIAGNOSIS — Z515 Encounter for palliative care: Secondary | ICD-10-CM | POA: Diagnosis not present

## 2020-06-18 DIAGNOSIS — I5023 Acute on chronic systolic (congestive) heart failure: Secondary | ICD-10-CM | POA: Diagnosis not present

## 2020-06-18 DIAGNOSIS — E1169 Type 2 diabetes mellitus with other specified complication: Secondary | ICD-10-CM | POA: Diagnosis not present

## 2020-06-18 DIAGNOSIS — I5021 Acute systolic (congestive) heart failure: Secondary | ICD-10-CM | POA: Diagnosis not present

## 2020-06-18 DIAGNOSIS — I509 Heart failure, unspecified: Secondary | ICD-10-CM

## 2020-06-18 DIAGNOSIS — I5043 Acute on chronic combined systolic (congestive) and diastolic (congestive) heart failure: Secondary | ICD-10-CM | POA: Diagnosis not present

## 2020-06-18 LAB — BASIC METABOLIC PANEL
Anion gap: 9 (ref 5–15)
BUN: 40 mg/dL — ABNORMAL HIGH (ref 6–20)
CO2: 24 mmol/L (ref 22–32)
Calcium: 9.2 mg/dL (ref 8.9–10.3)
Chloride: 92 mmol/L — ABNORMAL LOW (ref 98–111)
Creatinine, Ser: 1.58 mg/dL — ABNORMAL HIGH (ref 0.61–1.24)
GFR, Estimated: 51 mL/min — ABNORMAL LOW (ref >60)
Glucose, Bld: 134 mg/dL — ABNORMAL HIGH (ref 70–99)
Potassium: 5.5 mmol/L — ABNORMAL HIGH (ref 3.5–5.1)
Sodium: 125 mmol/L — ABNORMAL LOW (ref 135–145)

## 2020-06-18 LAB — COOXEMETRY PANEL
Carboxyhemoglobin: 1.6 % — ABNORMAL HIGH (ref 0.5–1.5)
Methemoglobin: 0.5 % (ref 0.0–1.5)
O2 Saturation: 66.5 %
Total oxygen content: 60.6 mL/dL

## 2020-06-18 LAB — CBG MONITORING, ED
Glucose-Capillary: 101 mg/dL — ABNORMAL HIGH (ref 70–99)
Glucose-Capillary: 138 mg/dL — ABNORMAL HIGH (ref 70–99)
Glucose-Capillary: 139 mg/dL — ABNORMAL HIGH (ref 70–99)
Glucose-Capillary: 176 mg/dL — ABNORMAL HIGH (ref 70–99)

## 2020-06-18 LAB — GLUCOSE, CAPILLARY
Glucose-Capillary: 190 mg/dL — ABNORMAL HIGH (ref 70–99)
Glucose-Capillary: 191 mg/dL — ABNORMAL HIGH (ref 70–99)

## 2020-06-18 MED ORDER — CHLORHEXIDINE GLUCONATE CLOTH 2 % EX PADS
6.0000 | MEDICATED_PAD | Freq: Every day | CUTANEOUS | Status: DC
  Administered 2020-06-18 – 2020-06-27 (×10): 6 via TOPICAL

## 2020-06-18 MED ORDER — MILRINONE LACTATE IN DEXTROSE 20-5 MG/100ML-% IV SOLN
0.2500 ug/kg/min | INTRAVENOUS | Status: DC
  Administered 2020-06-18 – 2020-06-20 (×4): 0.25 ug/kg/min via INTRAVENOUS
  Filled 2020-06-18 (×4): qty 100

## 2020-06-18 NOTE — Progress Notes (Signed)
PROGRESS NOTE    [name] [name]  GEZ:[id number] DOB: 1960/08/30 DOA: 06/17/2020 PCP: [name], MD   Chief Complaint  Patient presents with  . Shortness of Breath   Brief Narrative:  60 year old male with CAD/LAD last cath 01/20/2020 with multivessel disease not amenable to stenting, severe systolic CHF with EF 65%, diabetes with neuropathy, PAD status post right BKA, persistent A. fib on Eliquis, COPD, tobacco abuse presented with shortness of breath fluid retention in legs and intermittent chest pain. In the ED BNP 1195 troponin 39> 54 creatinine 1.5 baseline 0.8 EKG A. fib chest x-ray with CHF fluid overload patient was placed on IV Lasix and admitted   Subjective: Seen this am, patient reports he feels the same still short of breath.  Denies chest pain.  Left leg is swollen right has BKA. Nursing reports Lasix drip was not continued due to blood pressure being on lower side yesterday.  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  Echo showing EF less than 20%, global hypokinesia, RV systolic function is severely reduced with severely elevated pulmonary artery systolic pressure, right ventricular systolic pressure at 53.  Cardiology on board and the plan is to start milrinone drip in ICU along with Lasix drip, nephrology okay to continue Lasix drip as long as SBP > 80.patient was placed on midodrine. Not on ACE or ARB and metolazone also on hold.  AKI ?cardiorenal syndrome versus prerenal:  Also complicated by hypotension, now on Lasix drip, avoiding antihypertensive for now.  Nephrology on board and appreciate.   Recent Labs  Lab 06/12/20 1327 06/14/20 1413 06/17/20 0130 06/18/20 0412  BUN 20 24* 34* 40*  CREATININE 1.13 1.10 1.50* 1.58*   Hyperkalemia potassium 5.2: Uptrending 5.5 monitor, nephro on board-defer to nephrology,  can try [facility].  Chronic hyponatremia in the setting of CHF.  Poor prognostic indicator, likely from fluid overload.  He has been on salt tablet at home.  Continue diuresis and monitor, Recent Labs  Lab 06/12/20 1327 06/14/20 1413 06/17/20 0130 06/18/20 0412  NA 126* 124* 125* 125*   CAD/hypertension/HLD: BP is soft.  Continue midodrine noted plan for milrinone and Lasix drip in ICU. Continue Plavix, lipitor.  Persistent atrial fibrillation continue Eliquis metoprolol as needed.  Blood pressure tolerates.  T2DM-hemoglobin A1c 6.1. Blood sugar fairly stable.  Monitor on sliding scale insulin. Recent Labs  Lab 06/17/20 1556 06/17/20 1921 06/18/20 0035 06/18/20 0404 06/18/20 0759  GLUCAP 162* 162* 139* 138* 101*   Morbid obesity with BMI 32.7.  Will benefit with weight loss/PCP follow-up.  Goals of care remains full code-confirmed with the patient.  Will benefit with palliative care consultation given his advanced heart failure..  Nutrition: Diet Order            Diet heart healthy/carb modified Room service appropriate? Yes; Fluid consistency: Thin  Diet effective now                 Body mass index is 32.73 kg/m.  DVT prophylaxis: E liquis Code Status:   Code Status: Full Code  Family Communication: plan of care discussed with patient at bedside.  Status is: Inpatient Remains inpatient appropriate because:IV treatments appropriate due to intensity of illness or inability to take PO and Inpatient level of care appropriate due to severity of illness  Dispo: The patient is from: Home  Anticipated d/c is to: Home              Anticipated d/c date is: 3 days              Patient currently is not medically stable to d/c.  Consultants: PCCM, cardiology, nephrology Procedures:see note  Culture/Microbiology    Component Value Date/Time   SDES BLOOD BLOOD LEFT HAND 01/19/2020 1833   SPECREQUEST  01/19/2020 1833    BOTTLES DRAWN AEROBIC AND ANAEROBIC  Blood Culture adequate volume   CULT  01/19/2020 1833    NO GROWTH 5 DAYS Performed at [facility], [address] [place], [place] [address]    REPTSTATUS 01/24/2020 FINAL 01/19/2020 1833    Other culture-see note  Medications: Scheduled Meds: . apixaban  5 mg Oral BID  . atorvastatin  80 mg Oral Daily  . clopidogrel  75 mg Oral Daily  . DULoxetine  30 mg Oral Daily  . ezetimibe  10 mg Oral Daily  . furosemide  80 mg Intramuscular Once  . gabapentin  400 mg Oral QID  . insulin aspart  0-20 Units Subcutaneous Q4H  . methylPREDNISolone (SOLU-MEDROL) injection  40 mg Intravenous Daily  . metoprolol succinate  25 mg Oral Daily  . midodrine  5 mg Oral TID WC  . mometasone-formoterol  2 puff Inhalation BID  . multivitamin with minerals  1 tablet Oral Daily  . pantoprazole  40 mg Oral Daily  . pantoprazole  40 mg Oral Daily  . potassium chloride  40 mEq Oral Once  . sodium chloride flush  3 mL Intravenous Q12H  . tamsulosin  0.4 mg Oral QPC supper  . traZODone  150 mg Oral QHS  . umeclidinium bromide  1 puff Inhalation Daily  . Vericiguat  5 mg Oral Daily   Continuous Infusions: . sodium chloride    . furosemide (LASIX) 200 mg in dextrose 5% 100 mL (2mg /mL) infusion Stopped (06/17/20 1125)    Antimicrobials: Anti-infectives (From admission, onward)   None     Objective: Vitals: Today's Vitals   06/18/20 0530 06/18/20 0600 06/18/20 0630 06/18/20 0731  BP: 101/82 102/90 105/79 96/68  Pulse: 99 75 82 94  Resp: 18   20  Temp:    (!) 97.5 F (36.4 C)  TempSrc:    Oral  SpO2: 98% 99% 98% 100%  Weight:      Height:      PainSc:    5     Intake/Output Summary (Last 24 hours) at 06/18/2020 0818 Last data filed at 06/17/2020 2300 Gross per 24 hour  Intake -  Output 400 ml  Net -400 ml   Filed Weights   06/17/20 0036  Weight: 94.8 kg   Weight change:   Intake/Output from previous day: 01/08 0701 - 01/09 0700 In: -  Out: 800 [Urine:800] Intake/Output  this shift: No intake/output data recorded.  Examination: General exam: AAOx3 ,NAD, weak appearing. HEENT:Oral mucosa moist, Ear/Nose WNL grossly,dentition normal. Respiratory system: bilaterally diminished BS,no use of accessory muscle, non tender. Cardiovascular system: S1 & S2 +, regular, no JVD. Gastrointestinal system: Abdomen soft, distended-obese,BS+. Nervous System:Alert, awake, moving extremities and grossly nonfocal Extremities:  LLE edematous, Rt BKA, distal peripheral pulses palpable.  Skin: No rashes,no icterus. MSK: Normal muscle bulk,tone, power  Data Reviewed: I have personally reviewed following labs and imaging studies CBC: Recent Labs  Lab 06/17/20 0049  WBC 8.3  HGB 13.1  HCT 40.3  MCV 82.6  PLT 184  Basic Metabolic Panel: Recent Labs  Lab 06/12/20 1327 06/14/20 1413 06/17/20 0130 06/18/20 0412  NA 126* 124* 125* 125*  K 4.6 4.6 5.2* 5.5*  CL 89* 89* 90* 92*  CO2 29 27 25 24   GLUCOSE 115* 113* 124* 134*  BUN 20 24* 34* 40*  CREATININE 1.13 1.10 1.50* 1.58*  CALCIUM 9.0 9.0 9.1 9.2   GFR: Estimated Creatinine Clearance: 56.6 mL/min (A) (by C-G formula based on SCr of 1.58 mg/dL (H)). Liver Function Tests: Recent Labs  Lab 06/17/20 0130  AST 81*  ALT 37  ALKPHOS 114  BILITOT 3.7*  PROT 7.0  ALBUMIN 3.8   No results for input(s): LIPASE, AMYLASE in the last 168 hours. No results for input(s): AMMONIA in the last 168 hours. Coagulation Profile: No results for input(s): INR, PROTIME in the last 168 hours. Cardiac Enzymes: No results for input(s): CKTOTAL, CKMB, CKMBINDEX, TROPONINI in the last 168 hours. BNP (last 3 results) No results for input(s): PROBNP in the last 8760 hours. HbA1C: Recent Labs    06/17/20 0600  HGBA1C 6.1*   CBG: Recent Labs  Lab 06/17/20 1556 06/17/20 1921 06/18/20 0035 06/18/20 0404 06/18/20 0759  GLUCAP 162* 162* 139* 138* 101*   Lipid Profile: No results for input(s): CHOL, HDL, LDLCALC, TRIG,  CHOLHDL, LDLDIRECT in the last 72 hours. Thyroid Function Tests: No results for input(s): TSH, T4TOTAL, FREET4, T3FREE, THYROIDAB in the last 72 hours. Anemia Panel: No results for input(s): VITAMINB12, FOLATE, FERRITIN, TIBC, IRON, RETICCTPCT in the last 72 hours. Sepsis Labs: Recent Labs  Lab 06/17/20 0130  PROCALCITON <0.10    Recent Results (from the past 240 hour(s))  Resp Panel by RT-PCR (Flu A&B, Covid)     Status: None   Collection Time: 06/17/20  6:00 AM  Result Value Ref Range Status   SARS Coronavirus 2 by RT PCR NEGATIVE NEGATIVE Final    Comment: (NOTE) SARS-CoV-2 target nucleic acids are NOT DETECTED.  The SARS-CoV-2 RNA is generally detectable in upper respiratory specimens during the acute phase of infection. The lowest concentration of SARS-CoV-2 viral copies this assay can detect is 138 copies/mL. A negative result does not preclude SARS-Cov-2 infection and should not be used as the sole basis for treatment or other patient management decisions. A negative result may occur with  improper specimen collection/handling, submission of specimen other than nasopharyngeal swab, presence of viral mutation(s) within the areas targeted by this assay, and inadequate number of viral copies(<138 copies/mL). A negative result must be combined with clinical observations, patient history, and epidemiological information. The expected result is Negative.  Fact Sheet for Patients:  EntrepreneurPulse.com.au  Fact Sheet for Healthcare Providers:  IncredibleEmployment.be  This test is no t yet approved or cleared by the [place] FDA and  has been authorized for detection and/or diagnosis of SARS-CoV-2 by FDA under an Emergency Use Authorization (EUA). This EUA will remain  in effect (meaning this test can be used) for the duration of the COVID-19 declaration under Section 564(b)(1) of the Act, 21 U.S.C.section 360bbb-3(b)(1), unless the  authorization is terminated  or revoked sooner.       Influenza A by PCR NEGATIVE NEGATIVE Final   Influenza B by PCR NEGATIVE NEGATIVE Final    Comment: (NOTE) The Xpert Xpress SARS-CoV-2/FLU/RSV plus assay is intended as an aid in the diagnosis of influenza from Nasopharyngeal swab specimens and should not be used as a sole basis for treatment. Nasal washings and aspirates are unacceptable for Xpert Xpress SARS-CoV-2/FLU/RSV  testing.  Fact Sheet for Patients: EntrepreneurPulse.com.au  Fact Sheet for Healthcare Providers: IncredibleEmployment.be  This test is not yet approved or cleared by the [place] FDA and has been authorized for detection and/or diagnosis of SARS-CoV-2 by FDA under an Emergency Use Authorization (EUA). This EUA will remain in effect (meaning this test can be used) for the duration of the COVID-19 declaration under Section 564(b)(1) of the Act, 21 U.S.C. section 360bbb-3(b)(1), unless the authorization is terminated or revoked.  Performed at [facility], [address] [place], [place] [address]      Radiology Studies: DG Chest 1 View  Result Date: 06/17/2020 CLINICAL DATA:  Shortness of breath and dizziness for 2 days, intermittent chest pain, bilateral lower extremity swelling, no fever EXAM: CHEST  1 VIEW COMPARISON:  Radiograph 01/19/2020, CT 08/30/2019 FINDINGS: Some increased hazy interstitial opacities are present towards the lung bases is central pulmonary vascular congestion, as well as fissural and septal thickening. No consolidative opacity. No pneumothorax or effusion. Cardiomegaly, similar to prior portable radiography. Slight increasing prominence of the central pulmonary arteries is noted. The aorta is calcified. The remaining cardiomediastinal contours are unremarkable. Degenerative changes are present in the imaged spine and shoulders. Remote deformity of the distal right clavicle. No acute  osseous or soft tissue abnormality. IMPRESSION: 1. Findings consistent with CHF/volume overload with interstitial pulmonary edema and cardiomegaly. 2. Slight increasing prominence of the central pulmonary arteries, suspicious for pulmonary artery hypertension. Electronically Signed   By: Lovena Le M.D.   On: 06/17/2020 01:33   ECHOCARDIOGRAM COMPLETE  Result Date: 06/17/2020    ECHOCARDIOGRAM REPORT   Patient Name:   [name] [name] Date of Exam: 06/17/2020 Medical Rec #:  [id number]      Height:       67.0 in Accession #:    [id number]     Weight:       209.0 lb Date of Birth:  1961-04-24       BSA:          2.060 m Patient Age:    29 years       BP:           82/70 mmHg Patient Gender: M              HR:           95 bpm. Exam Location:  [facility] Procedure: 2D Echo Indications:     CHF-Acute Systolic B84.66  History:         Patient has prior history of Echocardiogram examinations, most                  recent 01/20/2020. CAD, COPD; Risk Factors:Hypertension,                  Diabetes and Dyslipidemia.  Sonographer:     Avanell Shackleton Referring Phys:  [id number] Athena Masse Diagnosing Phys: Ida Rogue MD IMPRESSIONS  1. Left ventricular ejection fraction, by estimation, is <20%. Left ventricular ejection fraction by PLAX is 14 %. The left ventricle has severely decreased function. The left ventricle demonstrates global hypokinesis. The left ventricular internal cavity size was severely dilated. Left ventricular diastolic parameters are indeterminate.  2. Right ventricular systolic function is severely reduced. The right ventricular size is moderately enlarged. There is severely elevated pulmonary artery systolic pressure. The estimated right ventricular systolic pressure is 17.7 mmHg.  3. Left atrial size was severely dilated.  4. Right atrial size was severely dilated.  5. Tricuspid valve regurgitation is mild to moderate.  6. The inferior vena cava is dilated in size with <50% respiratory variability, suggesting  right atrial pressure of 15 mmHg. FINDINGS  Left Ventricle: Left ventricular ejection fraction, by estimation, is <20%. Left ventricular ejection fraction by PLAX is 14 %. The left ventricle has severely decreased function. The left ventricle demonstrates global hypokinesis. The left ventricular internal cavity size was severely dilated. There is no left ventricular hypertrophy. Left ventricular diastolic parameters are indeterminate. Right Ventricle: The right ventricular size is moderately enlarged. No increase in right ventricular wall thickness. Right ventricular systolic function is severely reduced. There is severely elevated pulmonary artery systolic pressure. The tricuspid regurgitant velocity is 2.88 m/s, and with an assumed right atrial pressure of 20 mmHg, the estimated right ventricular systolic pressure is 46.5 mmHg. Left Atrium: Left atrial size was severely dilated. Right Atrium: Right atrial size was severely dilated. Pericardium: There is no evidence of pericardial effusion. Mitral Valve: The mitral valve is normal in structure. Mild mitral valve regurgitation. No evidence of mitral valve stenosis. Tricuspid Valve: The tricuspid valve is normal in structure. Tricuspid valve regurgitation is mild to moderate. No evidence of tricuspid stenosis. Aortic Valve: The aortic valve was not well visualized. Aortic valve regurgitation is not visualized. No aortic stenosis is present. Aortic valve mean gradient measures 4.0 mmHg. Aortic valve peak gradient measures 6.7 mmHg. Aortic valve area, by VTI measures 1.02 cm. Pulmonic Valve: The pulmonic valve was normal in structure. Pulmonic valve regurgitation is mild. No evidence of pulmonic stenosis. Aorta: The aortic root is normal in size and structure. Venous: The inferior vena cava is dilated in size with less than 50% respiratory variability, suggesting right atrial pressure of 15 mmHg. IAS/Shunts: No atrial level shunt detected by color flow Doppler.  LEFT  VENTRICLE PLAX 2D LV EF:         Left ventricular ejection fraction by PLAX is 14 %. LVIDd:         6.64 cm LVIDs:         6.20 cm LV PW:         1.09 cm LV IVS:        1.22 cm LVOT diam:     2.10 cm LV SV:         18 LV SV Index:   9 LVOT Area:     3.46 cm  LV Volumes (MOD) LV vol d, MOD A2C: 299.0 ml LV vol d, MOD A4C: 284.0 ml LV vol s, MOD A2C: 258.0 ml LV vol s, MOD A4C: 243.0 ml LV SV MOD A2C:     41.0 ml LV SV MOD A4C:     284.0 ml LV SV MOD BP:      40.8 ml IVC IVC diam: 2.73 cm LEFT ATRIUM             Index       RIGHT ATRIUM           Index LA diam:        5.80 cm 2.81 cm/m  RA Area:     31.80 cm LA Vol (A2C):   78.7 ml 38.20 ml/m RA Volume:   130.00 ml 63.09 ml/m LA Vol (A4C):   75.4 ml 36.59 ml/m LA Biplane Vol: 77.1 ml 37.42 ml/m  AORTIC VALVE AV Area (Vmax):    1.07 cm AV Area (Vmean):   1.02 cm AV Area (VTI):     1.02 cm AV Vmax:  129.67 cm/s AV Vmean:          93.300 cm/s AV VTI:            0.181 m AV Peak Grad:      6.7 mmHg AV Mean Grad:      4.0 mmHg LVOT Vmax:         40.20 cm/s LVOT Vmean:        27.600 cm/s LVOT VTI:          0.053 m LVOT/AV VTI ratio: 0.29  AORTA Ao Root diam: 3.20 cm MR Peak grad: 92.2 mmHg   TRICUSPID VALVE MR Mean grad: 51.0 mmHg   TR Peak grad:   33.2 mmHg MR Vmax:      480.00 cm/s TR Vmax:        288.00 cm/s MR Vmean:     329.0 cm/s                           SHUNTS                           Systemic VTI:  0.05 m                           Systemic Diam: 2.10 cm Ida Rogue MD Electronically signed by Ida Rogue MD Signature Date/Time: 06/17/2020/7:04:59 PM    Final    US ABDOMEN LIMITED RUQ (LIVER/GB)  Result Date: 06/17/2020 CLINICAL DATA:  Hyperbilirubinemia EXAM: ULTRASOUND ABDOMEN LIMITED RIGHT UPPER QUADRANT COMPARISON:  03/07/2014 abdominal CT FINDINGS: Gallbladder: Gallbladder wall thickening to 4 mm.  No stone or focal tenderness. Common bile duct: Diameter: 7 mm. Liver: No focal lesion identified. Within normal limits in parenchymal  echogenicity. Nodular appearance of the liver surface. Portal vein is patent on color Doppler imaging with normal direction of blood flow towards the liver. Portal vein flow is pulsatile, presumably from cirrhosis, but possibly also related to right heart failure given chest CT findings March 2021. Other: Small volume ascites. IMPRESSION: 1. Cirrhotic appearing liver surface with small volume ascites and pulsatile portal vein flow. 2. Thickened gallbladder without calculus, considered reactive. Electronically Signed   By: Monte Fantasia M.D.   On: 06/17/2020 05:46     LOS: 1 day   Antonieta Pert, MD [facility]  06/18/2020, 8:18 AM

## 2020-06-18 NOTE — ED Notes (Signed)
This RN to bedside, medications administered per order. Pt tolerated well. Pt eating breakfast tray at this time. Pt requesting pain medication at this time. Explained will review chart. Pt states understanding. Pt denies further needs at this time, resting in bed watching TV at this time.

## 2020-06-18 NOTE — Progress Notes (Signed)
PT Cancellation Note  Patient Details Name: CORIE VAVRA MRN: [id number] DOB: 09-22-1960   Cancelled Treatment:    Reason Eval/Treat Not Completed: Medical issues which prohibited therapy.  Pt has been sent to ICU, will reassess with MD if he is ready for PT tomorrow.   Ramond Dial 06/18/2020, 1:56 PM   Mee Hives, PT MS Acute Rehab Dept. Number: [place] [facility] [place]

## 2020-06-18 NOTE — ED Notes (Signed)
Admitting MD at bedside at this time.

## 2020-06-18 NOTE — Consult Note (Signed)
[name] [place], [place] [address] Phone [phone]. Fax [phone]  Date: 06/18/2020             Hospital Course: Patient is a 60 y.o. male  admitted to [facility] on 06/17/2020 for evaluation of CHF exacerbation ([place]) [I50.9]  Patient has significant coronary artery disease, with history of LAD, RCA stent, last cath on August 2021 showing multivessel disease, chronic systolic CHF with EF less than 20%(01/20/2020), diabetes with neuropathy, peripheral arterial disease status post right BKA, atrial fibrillation on Eliquis, COPD, tobacco abuse. Presented to the emergency room with shortness of breath, lower extremity edema Baseline creatinine of 1.10, presenting creatinine of 1.50 on January 8. Also noted to have low sodium of 125      Today, Patient is doing fair States he is able to eat without nausea or vomiting Lasix drip was not started by ED due to low BP    Vital Signs: Blood pressure 100/82, pulse 100, temperature (!) 97.5 F (36.4 C), temperature source Oral, resp. rate (!) 22, height 5\' 7"  (1.702 m), weight 94.8 kg, SpO2 93 %.   Intake/Output Summary (Last 24 hours) at 06/18/2020 1048 Last data filed at 06/17/2020 2300 Gross per 24 hour  Intake --  Output 400 ml  Net -400 ml    Weight trends: [employer]   06/17/20 0036  Weight: 94.8 kg    Physical Exam: General:  Laying in the bed, no acute distress  HEENT  moist oral mucous membranes, anicteric  Neck:  Supple, no masses  Lungs:  Mild basilar crackles, room air  Heart::  Irregular, tachycardic, 2/6 systolic murmur  Abdomen:  Distended, ascites  Extremities:  Tight lower extremity edema in the left up to abdomen, right BKA  Neurologic:  Alert and oriented  Skin:  Congestive changes in the left leg    Lab results: Basic Metabolic Panel: Recent Labs  Lab 06/14/20 1413 06/17/20 0130 06/18/20 0412  NA 124* 125* 125*  K 4.6 5.2* 5.5*  CL 89* 90* 92*  CO2 27 25 24   GLUCOSE 113* 124* 134*   BUN 24* 34* 40*  CREATININE 1.10 1.50* 1.58*  CALCIUM 9.0 9.1 9.2    Liver Function Tests: Recent Labs  Lab 06/17/20 0130  AST 81*  ALT 37  ALKPHOS 114  BILITOT 3.7*  PROT 7.0  ALBUMIN 3.8   No results for input(s): LIPASE, AMYLASE in the last 168 hours. No results for input(s): AMMONIA in the last 168 hours.  CBC: Recent Labs  Lab 06/17/20 0049  WBC 8.3  HGB 13.1  HCT 40.3  MCV 82.6  PLT 184    Cardiac Enzymes: No results for input(s): CKTOTAL, TROPONINI in the last 168 hours.  BNP: Invalid input(s): POCBNP  CBG: Recent Labs  Lab 06/17/20 1556 06/17/20 1921 06/18/20 0035 06/18/20 0404 06/18/20 0759  GLUCAP 162* 162* 139* 138* 101*    Microbiology: Recent Results (from the past 720 hour(s))  Resp Panel by RT-PCR (Flu A&B, Covid)     Status: None   Collection Time: 06/17/20  6:00 AM  Result Value Ref Range Status   SARS Coronavirus 2 by RT PCR NEGATIVE NEGATIVE Final    Comment: (NOTE) SARS-CoV-2 target nucleic acids are NOT DETECTED.  The SARS-CoV-2 RNA is generally detectable in upper respiratory specimens during the acute phase of infection. The lowest concentration of SARS-CoV-2 viral copies this assay can detect is 138 copies/mL. A negative result does not preclude SARS-Cov-2 infection and should not be used as  the sole basis for treatment or other patient management decisions. A negative result may occur with  improper specimen collection/handling, submission of specimen other than nasopharyngeal swab, presence of viral mutation(s) within the areas targeted by this assay, and inadequate number of viral copies(<138 copies/mL). A negative result must be combined with clinical observations, patient history, and epidemiological information. The expected result is Negative.  Fact Sheet for Patients:  EntrepreneurPulse.com.au  Fact Sheet for Healthcare Providers:  IncredibleEmployment.be  This test is no t  yet approved or cleared by the [place] FDA and  has been authorized for detection and/or diagnosis of SARS-CoV-2 by FDA under an Emergency Use Authorization (EUA). This EUA will remain  in effect (meaning this test can be used) for the duration of the COVID-19 declaration under Section 564(b)(1) of the Act, 21 U.S.C.section 360bbb-3(b)(1), unless the authorization is terminated  or revoked sooner.       Influenza A by PCR NEGATIVE NEGATIVE Final   Influenza B by PCR NEGATIVE NEGATIVE Final    Comment: (NOTE) The Xpert Xpress SARS-CoV-2/FLU/RSV plus assay is intended as an aid in the diagnosis of influenza from Nasopharyngeal swab specimens and should not be used as a sole basis for treatment. Nasal washings and aspirates are unacceptable for Xpert Xpress SARS-CoV-2/FLU/RSV testing.  Fact Sheet for Patients: EntrepreneurPulse.com.au  Fact Sheet for Healthcare Providers: IncredibleEmployment.be  This test is not yet approved or cleared by the [place] FDA and has been authorized for detection and/or diagnosis of SARS-CoV-2 by FDA under an Emergency Use Authorization (EUA). This EUA will remain in effect (meaning this test can be used) for the duration of the COVID-19 declaration under Section 564(b)(1) of the Act, 21 U.S.C. section 360bbb-3(b)(1), unless the authorization is terminated or revoked.  Performed at [facility], [address] [place], [place] [address]      Coagulation Studies: No results for input(s): LABPROT, INR in the last 72 hours.  Urinalysis: No results for input(s): COLORURINE, LABSPEC, PHURINE, GLUCOSEU, HGBUR, BILIRUBINUR, KETONESUR, PROTEINUR, UROBILINOGEN, NITRITE, LEUKOCYTESUR in the last 72 hours.  Invalid input(s): APPERANCEUR      Imaging: DG Chest 1 View  Result Date: 06/17/2020 CLINICAL DATA:  Shortness of breath and dizziness for 2 days, intermittent chest pain, bilateral lower  extremity swelling, no fever EXAM: CHEST  1 VIEW COMPARISON:  Radiograph 01/19/2020, CT 08/30/2019 FINDINGS: Some increased hazy interstitial opacities are present towards the lung bases is central pulmonary vascular congestion, as well as fissural and septal thickening. No consolidative opacity. No pneumothorax or effusion. Cardiomegaly, similar to prior portable radiography. Slight increasing prominence of the central pulmonary arteries is noted. The aorta is calcified. The remaining cardiomediastinal contours are unremarkable. Degenerative changes are present in the imaged spine and shoulders. Remote deformity of the distal right clavicle. No acute osseous or soft tissue abnormality. IMPRESSION: 1. Findings consistent with CHF/volume overload with interstitial pulmonary edema and cardiomegaly. 2. Slight increasing prominence of the central pulmonary arteries, suspicious for pulmonary artery hypertension. Electronically Signed   By: Lovena Le M.D.   On: 06/17/2020 01:33   ECHOCARDIOGRAM COMPLETE  Result Date: 06/17/2020    ECHOCARDIOGRAM REPORT   Patient Name:   [name] [name] Date of Exam: 06/17/2020 Medical Rec #:  [id number]      Height:       67.0 in Accession #:    [id number]     Weight:       209.0 lb Date of Birth:  [birth year]  BSA:          2.060 m Patient Age:    89 years       BP:           82/70 mmHg Patient Gender: M              HR:           95 bpm. Exam Location:  [facility] Procedure: 2D Echo Indications:     CHF-Acute Systolic Y09.98  History:         Patient has prior history of Echocardiogram examinations, most                  recent 01/20/2020. CAD, COPD; Risk Factors:Hypertension,                  Diabetes and Dyslipidemia.  Sonographer:     Avanell Shackleton Referring Phys:  [id number] Athena Masse Diagnosing Phys: Ida Rogue MD IMPRESSIONS  1. Left ventricular ejection fraction, by estimation, is <20%. Left ventricular ejection fraction by PLAX is 14 %. The left ventricle has severely  decreased function. The left ventricle demonstrates global hypokinesis. The left ventricular internal cavity size was severely dilated. Left ventricular diastolic parameters are indeterminate.  2. Right ventricular systolic function is severely reduced. The right ventricular size is moderately enlarged. There is severely elevated pulmonary artery systolic pressure. The estimated right ventricular systolic pressure is 39.7 mmHg.  3. Left atrial size was severely dilated.  4. Right atrial size was severely dilated.  5. Tricuspid valve regurgitation is mild to moderate.  6. The inferior vena cava is dilated in size with <50% respiratory variability, suggesting right atrial pressure of 15 mmHg. FINDINGS  Left Ventricle: Left ventricular ejection fraction, by estimation, is <20%. Left ventricular ejection fraction by PLAX is 14 %. The left ventricle has severely decreased function. The left ventricle demonstrates global hypokinesis. The left ventricular internal cavity size was severely dilated. There is no left ventricular hypertrophy. Left ventricular diastolic parameters are indeterminate. Right Ventricle: The right ventricular size is moderately enlarged. No increase in right ventricular wall thickness. Right ventricular systolic function is severely reduced. There is severely elevated pulmonary artery systolic pressure. The tricuspid regurgitant velocity is 2.88 m/s, and with an assumed right atrial pressure of 20 mmHg, the estimated right ventricular systolic pressure is 67.3 mmHg. Left Atrium: Left atrial size was severely dilated. Right Atrium: Right atrial size was severely dilated. Pericardium: There is no evidence of pericardial effusion. Mitral Valve: The mitral valve is normal in structure. Mild mitral valve regurgitation. No evidence of mitral valve stenosis. Tricuspid Valve: The tricuspid valve is normal in structure. Tricuspid valve regurgitation is mild to moderate. No evidence of tricuspid stenosis.  Aortic Valve: The aortic valve was not well visualized. Aortic valve regurgitation is not visualized. No aortic stenosis is present. Aortic valve mean gradient measures 4.0 mmHg. Aortic valve peak gradient measures 6.7 mmHg. Aortic valve area, by VTI measures 1.02 cm. Pulmonic Valve: The pulmonic valve was normal in structure. Pulmonic valve regurgitation is mild. No evidence of pulmonic stenosis. Aorta: The aortic root is normal in size and structure. Venous: The inferior vena cava is dilated in size with less than 50% respiratory variability, suggesting right atrial pressure of 15 mmHg. IAS/Shunts: No atrial level shunt detected by color flow Doppler.  LEFT VENTRICLE PLAX 2D LV EF:         Left ventricular ejection fraction by PLAX is 14 %. LVIDd:  6.64 cm LVIDs:         6.20 cm LV PW:         1.09 cm LV IVS:        1.22 cm LVOT diam:     2.10 cm LV SV:         18 LV SV Index:   9 LVOT Area:     3.46 cm  LV Volumes (MOD) LV vol d, MOD A2C: 299.0 ml LV vol d, MOD A4C: 284.0 ml LV vol s, MOD A2C: 258.0 ml LV vol s, MOD A4C: 243.0 ml LV SV MOD A2C:     41.0 ml LV SV MOD A4C:     284.0 ml LV SV MOD BP:      40.8 ml IVC IVC diam: 2.73 cm LEFT ATRIUM             Index       RIGHT ATRIUM           Index LA diam:        5.80 cm 2.81 cm/m  RA Area:     31.80 cm LA Vol (A2C):   78.7 ml 38.20 ml/m RA Volume:   130.00 ml 63.09 ml/m LA Vol (A4C):   75.4 ml 36.59 ml/m LA Biplane Vol: 77.1 ml 37.42 ml/m  AORTIC VALVE AV Area (Vmax):    1.07 cm AV Area (Vmean):   1.02 cm AV Area (VTI):     1.02 cm AV Vmax:           129.67 cm/s AV Vmean:          93.300 cm/s AV VTI:            0.181 m AV Peak Grad:      6.7 mmHg AV Mean Grad:      4.0 mmHg LVOT Vmax:         40.20 cm/s LVOT Vmean:        27.600 cm/s LVOT VTI:          0.053 m LVOT/AV VTI ratio: 0.29  AORTA Ao Root diam: 3.20 cm MR Peak grad: 92.2 mmHg   TRICUSPID VALVE MR Mean grad: 51.0 mmHg   TR Peak grad:   33.2 mmHg MR Vmax:      480.00 cm/s TR Vmax:         288.00 cm/s MR Vmean:     329.0 cm/s                           SHUNTS                           Systemic VTI:  0.05 m                           Systemic Diam: 2.10 cm Ida Rogue MD Electronically signed by Ida Rogue MD Signature Date/Time: 06/17/2020/7:04:59 PM    Final    US ABDOMEN LIMITED RUQ (LIVER/GB)  Result Date: 06/17/2020 CLINICAL DATA:  Hyperbilirubinemia EXAM: ULTRASOUND ABDOMEN LIMITED RIGHT UPPER QUADRANT COMPARISON:  03/07/2014 abdominal CT FINDINGS: Gallbladder: Gallbladder wall thickening to 4 mm.  No stone or focal tenderness. Common bile duct: Diameter: 7 mm. Liver: No focal lesion identified. Within normal limits in parenchymal echogenicity. Nodular appearance of the liver surface. Portal vein is patent on color Doppler imaging with normal direction of blood flow towards the  liver. Portal vein flow is pulsatile, presumably from cirrhosis, but possibly also related to right heart failure given chest CT findings March 2021. Other: Small volume ascites. IMPRESSION: 1. Cirrhotic appearing liver surface with small volume ascites and pulsatile portal vein flow. 2. Thickened gallbladder without calculus, considered reactive. Electronically Signed   By: Monte Fantasia M.D.   On: 06/17/2020 05:46    Scheduled Meds: . apixaban  5 mg Oral BID  . atorvastatin  80 mg Oral Daily  . Chlorhexidine Gluconate Cloth  6 each Topical Daily  . clopidogrel  75 mg Oral Daily  . DULoxetine  30 mg Oral Daily  . ezetimibe  10 mg Oral Daily  . gabapentin  400 mg Oral QID  . insulin aspart  0-20 Units Subcutaneous Q4H  . methylPREDNISolone (SOLU-MEDROL) injection  40 mg Intravenous Daily  . metoprolol succinate  25 mg Oral Daily  . midodrine  5 mg Oral TID WC  . mometasone-formoterol  2 puff Inhalation BID  . multivitamin with minerals  1 tablet Oral Daily  . pantoprazole  40 mg Oral Daily  . pantoprazole  40 mg Oral Daily  . sodium chloride flush  3 mL Intravenous Q12H  . tamsulosin  0.4 mg  Oral QPC supper  . traZODone  150 mg Oral QHS  . umeclidinium bromide  1 puff Inhalation Daily  . Vericiguat  5 mg Oral Daily   Continuous Infusions: . sodium chloride    . furosemide (LASIX) 200 mg in dextrose 5% 100 mL (2mg /mL) infusion 4 mg/hr (06/18/20 1059)  . milrinone 0.25 mcg/kg/min (06/18/20 1631)   PRN Meds:.sodium chloride, acetaminophen, Ipratropium-Albuterol, ipratropium-albuterol, nitroGLYCERIN, ondansetron (ZOFRAN) IV, oxyCODONE, sodium chloride flush  Assessment & Plan: Pt is a 60 y.o.   male with history of CAD including stent in  LAD, RCA, last cath on August 2021 showing multivessel disease, chronic systolic CHF with EF less than 20%(01/20/2020), diabetes with neuropathy, peripheral arterial disease status post right BKA, atrial fibrillation on Eliquis, COPD, tobacco abuse, was admitted on 06/17/2020 with CHF exacerbation ([place]) [I50.9]   1. AKI with volume overload 2. Hyponatremia 3.  Acute exacerbation of chronic Systolic CHF 4. Cirrhosis 5.  Hypotension 6.  Atrial fibrillation  Patient presents with volume overload, exacerbation of chronic systolic CHF.  Diuresis is limited because of low blood pressure and atrial fibrillation. Last 2D echo from January 20, 2020 shows severely decreased LV function with global hypokinesis moderately enlarged right ventricle, moderate tricuspid regurgitation Hyponatremia likely due to volume overload  Plan: Obtain urinalysis We will place patient on IV Lasix infusion Currently Albumin is 3.8, if goes lower, will consider IV albumin supplementation Consider midodrine to achieve higher blood pressures Cardiologist considering inotropic therapy Flomax for urinary hesitancy Avoid hypotension We will follow      LOS: 1 Jessicamarie Amiri 1/9/202210:48 AM    Note: This note was prepared with Dragon dictation. Any transcription errors are unintentional

## 2020-06-18 NOTE — Progress Notes (Addendum)
Progress Note  Patient Name: [name] [name] Date of Encounter: 06/18/2020  [facility] Cardiologist: Ida Rogue, MD   Subjective   Patient had minimal urine output on IV lasix drip. Pressures are soft but relatively stable on midodrine. He says he feels a little better today.  Inpatient Medications    Scheduled Meds: . apixaban  5 mg Oral BID  . atorvastatin  80 mg Oral Daily  . clopidogrel  75 mg Oral Daily  . DULoxetine  30 mg Oral Daily  . ezetimibe  10 mg Oral Daily  . furosemide  80 mg Intramuscular Once  . gabapentin  400 mg Oral QID  . insulin aspart  0-20 Units Subcutaneous Q4H  . methylPREDNISolone (SOLU-MEDROL) injection  40 mg Intravenous Daily  . metoprolol succinate  25 mg Oral Daily  . midodrine  5 mg Oral TID WC  . mometasone-formoterol  2 puff Inhalation BID  . multivitamin with minerals  1 tablet Oral Daily  . pantoprazole  40 mg Oral Daily  . pantoprazole  40 mg Oral Daily  . potassium chloride  40 mEq Oral Once  . sodium chloride flush  3 mL Intravenous Q12H  . tamsulosin  0.4 mg Oral QPC supper  . traZODone  150 mg Oral QHS  . umeclidinium bromide  1 puff Inhalation Daily  . Vericiguat  5 mg Oral Daily   Continuous Infusions: . sodium chloride    . furosemide (LASIX) 200 mg in dextrose 5% 100 mL (2mg /mL) infusion Stopped (06/17/20 1125)   PRN Meds: sodium chloride, acetaminophen, Ipratropium-Albuterol, ipratropium-albuterol, nitroGLYCERIN, ondansetron (ZOFRAN) IV, oxyCODONE, sodium chloride flush   Vital Signs    Vitals:   06/18/20 0530 06/18/20 0600 06/18/20 0630 06/18/20 0731  BP: 101/82 102/90 105/79 96/68  Pulse: 99 75 82 94  Resp: 18   20  Temp:    (!) 97.5 F (36.4 C)  TempSrc:    Oral  SpO2: 98% 99% 98% 100%  Weight:      Height:        Intake/Output Summary (Last 24 hours) at 06/18/2020 0851 Last data filed at 06/17/2020 2300 Gross per 24 hour  Intake --  Output 400 ml  Net -400 ml   Last 3 Weights 06/17/2020 05/30/2020  05/30/2020  Weight (lbs) 209 lb 213 lb 213 lb  Weight (kg) 94.802 kg 96.616 kg 96.616 kg  Some encounter information is confidential and restricted. Go to Review Flowsheets activity to see all data.      Telemetry    Afib, HR 80-90s, PVCs - Personally Reviewed  ECG    No new - Personally Reviewed  Physical Exam   GEN: No acute distress.   Neck: No JVD Cardiac: Irreg Irreg, no murmurs, rubs, or gallops.  Respiratory: crackles and wheezing GI: distended, nontender, non-distended  MS: No edema; No deformity. Neuro:  Nonfocal  Psych: Normal affect   Labs    High Sensitivity Troponin:   Recent Labs  Lab 06/17/20 0049 06/17/20 0420  TROPONINIHS 39* 54*      Chemistry Recent Labs  Lab 06/14/20 1413 06/17/20 0130 06/18/20 0412  NA 124* 125* 125*  K 4.6 5.2* 5.5*  CL 89* 90* 92*  CO2 27 25 24   GLUCOSE 113* 124* 134*  BUN 24* 34* 40*  CREATININE 1.10 1.50* 1.58*  CALCIUM 9.0 9.1 9.2  PROT  --  7.0  --   ALBUMIN  --  3.8  --   AST  --  81*  --  ALT  --  37  --   ALKPHOS  --  114  --   BILITOT  --  3.7*  --   GFRNONAA >60 54* 51*  ANIONGAP 8 10 9      Hematology Recent Labs  Lab 06/17/20 0049  WBC 8.3  RBC 4.88  HGB 13.1  HCT 40.3  MCV 82.6  MCH 26.8  MCHC 32.5  RDW 21.9*  PLT 184    BNP Recent Labs  Lab 06/12/20 1327 06/14/20 1413 06/17/20 0049  BNP 1,985.3* 2,062.1* 1,925.5*     DDimer No results for input(s): DDIMER in the last 168 hours.   Radiology    DG Chest 1 View  Result Date: 06/17/2020 CLINICAL DATA:  Shortness of breath and dizziness for 2 days, intermittent chest pain, bilateral lower extremity swelling, no fever EXAM: CHEST  1 VIEW COMPARISON:  Radiograph 01/19/2020, CT 08/30/2019 FINDINGS: Some increased hazy interstitial opacities are present towards the lung bases is central pulmonary vascular congestion, as well as fissural and septal thickening. No consolidative opacity. No pneumothorax or effusion. Cardiomegaly, similar  to prior portable radiography. Slight increasing prominence of the central pulmonary arteries is noted. The aorta is calcified. The remaining cardiomediastinal contours are unremarkable. Degenerative changes are present in the imaged spine and shoulders. Remote deformity of the distal right clavicle. No acute osseous or soft tissue abnormality. IMPRESSION: 1. Findings consistent with CHF/volume overload with interstitial pulmonary edema and cardiomegaly. 2. Slight increasing prominence of the central pulmonary arteries, suspicious for pulmonary artery hypertension. Electronically Signed   By: Lovena Le M.D.   On: 06/17/2020 01:33   ECHOCARDIOGRAM COMPLETE  Result Date: 06/17/2020    ECHOCARDIOGRAM REPORT   Patient Name:   [name] [name] Date of Exam: 06/17/2020 Medical Rec #:  [id number]      Height:       67.0 in Accession #:    [id number]     Weight:       209.0 lb Date of Birth:  [birth year]       BSA:          2.060 m Patient Age:    60 years       BP:           82/70 mmHg Patient Gender: M              HR:           95 bpm. Exam Location:  [facility] Procedure: 2D Echo Indications:     CHF-Acute Systolic E95.28  History:         Patient has prior history of Echocardiogram examinations, most                  recent 01/20/2020. CAD, COPD; Risk Factors:Hypertension,                  Diabetes and Dyslipidemia.  Sonographer:     Avanell Shackleton Referring Phys:  [id number] Athena Masse Diagnosing Phys: Ida Rogue MD IMPRESSIONS  1. Left ventricular ejection fraction, by estimation, is <20%. Left ventricular ejection fraction by PLAX is 14 %. The left ventricle has severely decreased function. The left ventricle demonstrates global hypokinesis. The left ventricular internal cavity size was severely dilated. Left ventricular diastolic parameters are indeterminate.  2. Right ventricular systolic function is severely reduced. The right ventricular size is moderately enlarged. There is severely elevated pulmonary artery  systolic pressure. The estimated right ventricular systolic pressure is 10.2 mmHg.  3. Left atrial  size was severely dilated.  4. Right atrial size was severely dilated.  5. Tricuspid valve regurgitation is mild to moderate.  6. The inferior vena cava is dilated in size with <50% respiratory variability, suggesting right atrial pressure of 15 mmHg. FINDINGS  Left Ventricle: Left ventricular ejection fraction, by estimation, is <20%. Left ventricular ejection fraction by PLAX is 14 %. The left ventricle has severely decreased function. The left ventricle demonstrates global hypokinesis. The left ventricular internal cavity size was severely dilated. There is no left ventricular hypertrophy. Left ventricular diastolic parameters are indeterminate. Right Ventricle: The right ventricular size is moderately enlarged. No increase in right ventricular wall thickness. Right ventricular systolic function is severely reduced. There is severely elevated pulmonary artery systolic pressure. The tricuspid regurgitant velocity is 2.88 m/s, and with an assumed right atrial pressure of 20 mmHg, the estimated right ventricular systolic pressure is 92.4 mmHg. Left Atrium: Left atrial size was severely dilated. Right Atrium: Right atrial size was severely dilated. Pericardium: There is no evidence of pericardial effusion. Mitral Valve: The mitral valve is normal in structure. Mild mitral valve regurgitation. No evidence of mitral valve stenosis. Tricuspid Valve: The tricuspid valve is normal in structure. Tricuspid valve regurgitation is mild to moderate. No evidence of tricuspid stenosis. Aortic Valve: The aortic valve was not well visualized. Aortic valve regurgitation is not visualized. No aortic stenosis is present. Aortic valve mean gradient measures 4.0 mmHg. Aortic valve peak gradient measures 6.7 mmHg. Aortic valve area, by VTI measures 1.02 cm. Pulmonic Valve: The pulmonic valve was normal in structure. Pulmonic valve  regurgitation is mild. No evidence of pulmonic stenosis. Aorta: The aortic root is normal in size and structure. Venous: The inferior vena cava is dilated in size with less than 50% respiratory variability, suggesting right atrial pressure of 15 mmHg. IAS/Shunts: No atrial level shunt detected by color flow Doppler.  LEFT VENTRICLE PLAX 2D LV EF:         Left ventricular ejection fraction by PLAX is 14 %. LVIDd:         6.64 cm LVIDs:         6.20 cm LV PW:         1.09 cm LV IVS:        1.22 cm LVOT diam:     2.10 cm LV SV:         18 LV SV Index:   9 LVOT Area:     3.46 cm  LV Volumes (MOD) LV vol d, MOD A2C: 299.0 ml LV vol d, MOD A4C: 284.0 ml LV vol s, MOD A2C: 258.0 ml LV vol s, MOD A4C: 243.0 ml LV SV MOD A2C:     41.0 ml LV SV MOD A4C:     284.0 ml LV SV MOD BP:      40.8 ml IVC IVC diam: 2.73 cm LEFT ATRIUM             Index       RIGHT ATRIUM           Index LA diam:        5.80 cm 2.81 cm/m  RA Area:     31.80 cm LA Vol (A2C):   78.7 ml 38.20 ml/m RA Volume:   130.00 ml 63.09 ml/m LA Vol (A4C):   75.4 ml 36.59 ml/m LA Biplane Vol: 77.1 ml 37.42 ml/m  AORTIC VALVE AV Area (Vmax):    1.07 cm AV Area (Vmean):   1.02 cm AV  Area (VTI):     1.02 cm AV Vmax:           129.67 cm/s AV Vmean:          93.300 cm/s AV VTI:            0.181 m AV Peak Grad:      6.7 mmHg AV Mean Grad:      4.0 mmHg LVOT Vmax:         40.20 cm/s LVOT Vmean:        27.600 cm/s LVOT VTI:          0.053 m LVOT/AV VTI ratio: 0.29  AORTA Ao Root diam: 3.20 cm MR Peak grad: 92.2 mmHg   TRICUSPID VALVE MR Mean grad: 51.0 mmHg   TR Peak grad:   33.2 mmHg MR Vmax:      480.00 cm/s TR Vmax:        288.00 cm/s MR Vmean:     329.0 cm/s                           SHUNTS                           Systemic VTI:  0.05 m                           Systemic Diam: 2.10 cm Ida Rogue MD Electronically signed by Ida Rogue MD Signature Date/Time: 06/17/2020/7:04:59 PM    Final    US ABDOMEN LIMITED RUQ (LIVER/GB)  Result Date:  06/17/2020 CLINICAL DATA:  Hyperbilirubinemia EXAM: ULTRASOUND ABDOMEN LIMITED RIGHT UPPER QUADRANT COMPARISON:  03/07/2014 abdominal CT FINDINGS: Gallbladder: Gallbladder wall thickening to 4 mm.  No stone or focal tenderness. Common bile duct: Diameter: 7 mm. Liver: No focal lesion identified. Within normal limits in parenchymal echogenicity. Nodular appearance of the liver surface. Portal vein is patent on color Doppler imaging with normal direction of blood flow towards the liver. Portal vein flow is pulsatile, presumably from cirrhosis, but possibly also related to right heart failure given chest CT findings March 2021. Other: Small volume ascites. IMPRESSION: 1. Cirrhotic appearing liver surface with small volume ascites and pulsatile portal vein flow. 2. Thickened gallbladder without calculus, considered reactive. Electronically Signed   By: Monte Fantasia M.D.   On: 06/17/2020 05:46    Cardiac Studies   Echo 06/18/19 1. Left ventricular ejection fraction, by estimation, is <20%. Left  ventricular ejection fraction by PLAX is 14 %. The left ventricle has  severely decreased function. The left ventricle demonstrates global  hypokinesis. The left ventricular internal  cavity size was severely dilated. Left ventricular diastolic parameters  are indeterminate.  2. Right ventricular systolic function is severely reduced. The right  ventricular size is moderately enlarged. There is severely elevated  pulmonary artery systolic pressure. The estimated right ventricular  systolic pressure is 97.3 mmHg.  3. Left atrial size was severely dilated.  4. Right atrial size was severely dilated.  5. Tricuspid valve regurgitation is mild to moderate.  6. The inferior vena cava is dilated in size with <50% respiratory  variability, suggesting right atrial pressure of 15 mmHg.   Echo 01/2020 1. Left ventricular ejection fraction, by estimation, is <20%. The left  ventricle has severely decreased  function. The left ventricle demonstrates  global hypokinesis. The left ventricular internal cavity size was  moderately dilated.  Left ventricular  diastolic parameters are indeterminate.  2. Right ventricular systolic function is moderately reduced. The right  ventricular size is moderately enlarged. There is normal pulmonary artery  systolic pressure. The estimated right ventricular systolic pressure is  16.1 mmHg.  3. Left atrial size was mildly dilated.  4. Mild mitral valve regurgitation.  5. Tricuspid valve regurgitation is moderate.   Cardiac cath 01/20/20   Prox RCA-1 lesion is 80% stenosed.  Prox RCA-2 lesion is 20% stenosed.  Prox RCA to Mid RCA lesion is 80% stenosed.  Prox LAD lesion is 20% stenosed.  1st Diag lesion is 80% stenosed.  Mid LAD lesion is 20% stenosed.  Dist LAD lesion is 60% stenosed.  Prox Cx to Mid Cx lesion is 90% stenosed.  LPAV lesion is 95% stenosed.  1. Significant underlying three-vessel coronary artery disease. Patent proximal LAD stent with mild in-stent restenosis. Moderate diffuse disease in the distal LAD. Significant stenosis in the proximal left circumflex at the origin of the posterior AV groove artery which is also heavily diseased at the ostium. This is a bifurcation lesion and heavily calcified. RCA stent is patent. However, there is significant proximal disease in the whole mid to distal segment is diffusely diseased and small caliber. 2. Left ventricular angiography was not performed. EF was severe reduced by echo. 3. Moderately elevated left ventricular end-diastolic pressure at 28 mmHg  Recommendations: The patient's cardiomyopathy seems to be out of proportion to his coronary artery disease as the LAD itself does not seem to have obstructive disease at the present time. The RCA is diffusely diseased and too small to stent. The only target for revascularization would be the left circumflex but it is a calcified bifurcation  lesion and likely requires atherectomy. In order to do this safely, the patient has to be optimized from a heart failure standpoint as he appears to be volume overloaded. Recommend intravenous diuresis. If limited by hypotension, we might need to consider inotropic support and small dose norepinephrine drip initially. I added small dose digoxin. Treat underlying atrial fibrillation. Resume heparin drip 8 hours after sheath pull.   Patient Profile     60 y.o. male with a hx of ICM EF 20-25%, CAD/PCI, tobacco abuse, COPD, PAD s/p L SFA stenting, s/p R BKA, afib on Eliquis, OSA noncompliant with CPAP, hx of leaving AMA and noncompliance who is being seen today for the evaluation of heart failure.  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 He has come in multiple times for IV lasix. He has missed some lasix doses - In the ED BNP elevated and CXR with CHF - IV lasix drip - PTA metoprolol and Imdur. Held for soft pressures - Midodrine 5mg  TID. Pressures still soft - No ACE/ARB with history of hypotension and CKD - Echo this admission showed LVEF <20%, global hypokinesis, severely dilated LV, severely reduced RV fucntion - strict I/Os, daily weights, monitor creatinine - With low urine output likely needs pressor support.   AKI  - creatinine 1.5 on arrival>>t is up today - baseline around 1 - IV lasix as above - daily BMET  Chronic hyponatremia - suspect 2/2 to volume overload  Elevated troponin with known CAD with prior PCI  - patient declined CABG in the past at [facility] - He had a cath in August showing no great PCI targets btu considered atherectomy of  Lcx however patient eventually left AMA - No chest pain reported - HS trop mildly elevated, suspect demand ischemia - continue statin, plavix,  Persistent Afib - hx of noncompliance on Eliquis - Dr.  Rockey Situ was considering DCCV but this was held due to noncompliance. Pt was also trialed on amiodarone - EKG with Afib and heart rate 100 bpm - Metoprolol for rate control>>held for soft pressures.   HLD - continue home atorvastatin 80mg  daily  HTN - midodrine 5mg  TID - pressures soft but relatively stable  DM2 - per IM  For questions or updates, please contact [place] Please consult [email or web address] for contact info under        Signed, Cadence Ninfa Meeker, PA-C  06/18/2020, 8:51 AM    History and all data above reviewed.  Patient examined.  I agree with the findings as above.  No change in his breathing, discomfort or swelling. Minimal urine output yesterday. The patient exam reveals UVO:[id number]   ,  Lungs: Decreased breath sounds  ,  Abd: Positive bowel sounds, no rebound no guarding, Ext Severe edema  .  All available labs, radiology testing, previous records reviewed. Agree with documented assessment and plan.   Acute systolic HF:  Plan to check a CoOx.  Plan milrinone.  Likely we will be able to increase his Lasix drip later.   Hyponatremia:  He might need a vasopressin receptor antagonist.  Follow Na and free water restrict. Hypotension:  Continue midodrine.  Unable to titrate meds otherwise.    Jeneen Rinks Alesia Oshields  1:39 PM  06/18/2020

## 2020-06-18 NOTE — Consult Note (Signed)
Name: [name] [name] MRN: [id number] DOB: Aug 14, 1960     CONSULTATION DATE: 06/17/2020  REFERRING MD : Maren Beach CHIEF COMPLAINT: Cardiogenic shock  HISTORY OF PRESENT ILLNESS:  60 y.o.male with a hx of ICM EF 20-25%, CAD/PCI, tobacco abuse, COPD, PAD s/p L SFA stenting, s/p R BKA, afib on Eliquis, OSA noncompliant with CPAP, hx of leaving AMA and noncompliance who is being seen today for the evaluation of heart failure. presents to the emergency room with shortness of breath, fluid retention in his legs and intermittent chest pain.  He uses albuterol which helped a bit but continues to have dyspnea with exertion.  He denies cough, fever or chills  Cardiology Consulted recommend ICU admission for inotrope Nephrology consulted for progressive cardiorenal syndrome-placed on lasix infusion  PROCAL 0.1 TROP 54 BNP 1925 HGB 13   PAST MEDICAL HISTORY :   has a past medical history of Arthritis, Asthma, Atherosclerosis, BPH (benign prostatic hyperplasia), Carotid arterial disease ([place]), Charcot's joint of foot, right, Chronic combined systolic (congestive) and diastolic (congestive) heart failure ([place]), COPD (chronic obstructive pulmonary disease) ([place]), Coronary artery disease, Diabetic neuropathy ([place]), Diabetic ulcer of right foot ([place]), Hernia, Hyperlipidemia, Hypertension, Ischemic cardiomyopathy, Morbid obesity ([place]), Neuropathy, PAD (peripheral artery disease) ([place]), Restless leg syndrome, Sleep apnea, Subclavian artery stenosis, right ([place]), Syncope and collapse, Tobacco use, Type II diabetes mellitus ([place]), and Varicose vein.  has a past surgical history that includes Appendectomy; Peripheral arterial stent graft; Clavicle surgery; Hernia repair; Cardiac catheterization (10/14); Cardiac catheterization (02/2010); Cardiac catheterization (02/21/2014); abdominal aortagram (N/A, 06/23/2013); Cardiac catheterization (N/A, 07/06/2015); Cardiac catheterization (07/06/2015); LEFT HEART CATH AND CORS/GRAFTS  ANGIOGRAPHY (N/A, 04/28/2017); PERIPHERAL VASCULAR BALLOON ANGIOPLASTY (Right, 01/06/2018); Irrigation and debridement foot (Right, 01/04/2018); Lower Extremity Angiography (Right, 03/03/2018); Amputation (Right, 04/01/2018); Lower Extremity Angiography (Left, 08/24/2018); Lower Extremity Angiography (Left, 06/14/2019); Lower Extremity Angiography (Left, 08/02/2019); and LEFT HEART CATH AND CORONARY ANGIOGRAPHY (N/A, 01/20/2020). Prior to Admission medications   Medication Sig Start Date End Date Taking? Authorizing Provider  atorvastatin (LIPITOR) 80 MG tablet Take 80 mg by mouth daily.   Yes [provider]  clopidogrel (PLAVIX) 75 MG tablet Take 75 mg by mouth daily.  08/22/17  Yes [provider]  ELIQUIS 5 MG TABS tablet Take 5 mg by mouth 2 (two) times daily. 02/02/20  Yes [provider]  empagliflozin (JARDIANCE) 10 MG TABS tablet Take 10 mg by mouth daily.  02/03/20  Yes [provider]  ezetimibe (ZETIA) 10 MG tablet Take 10 mg by mouth daily.   Yes [provider]  gabapentin (NEURONTIN) 800 MG tablet Take 800 mg by mouth 4 (four) times daily.  08/14/17 04/04/29 Yes [provider]  ipratropium-albuterol (DUONEB) 0.5-2.5 (3) MG/3ML SOLN Inhale 3 mLs into the lungs every 6 (six) hours as needed (wheezing/sob).    Yes [provider]  isosorbide mononitrate (IMDUR) 30 MG 24 hr tablet Take 30 mg by mouth daily.   Yes [provider]  metFORMIN (GLUCOPHAGE-XR) 500 MG 24 hr tablet Take 1,000 mg by mouth 2 (two) times daily.    Yes [provider]  metoprolol succinate (TOPROL-XL) 25 MG 24 hr tablet Take 1 tablet (25 mg total) by mouth daily. 05/16/20  Yes Minna Merritts, MD  Multiple Vitamin (MULTI-VITAMINS) TABS Take 1 tablet by mouth daily.    Yes [provider]  nitroGLYCERIN (NITROSTAT) 0.4 MG SL tablet Place 1 tablet (0.4 mg total) under the tongue every 5 (five) minutes as needed for chest pain.  05/19/19  Yes  Hackney, Otila Kluver A, FNP  omeprazole (PRILOSEC) 40 MG capsule Take 40 mg by mouth daily.   Yes [provider]  Oxycodone HCl 10 MG TABS Take 10 mg by mouth every 6 (six) hours as needed (pain).  01/15/18  Yes [provider]  potassium chloride (KLOR-CON) 10 MEQ tablet Take 10 mEq by mouth 2 (two) times daily.   Yes [provider]  sodium chloride 1 g tablet Take 1 tablet (1 g total) by mouth 3 (three) times daily with meals. 06/12/20  Yes Alisa Graff, FNP  SYMBICORT 80-4.5 MCG/ACT inhaler Inhale 2 puffs into the lungs daily.  01/01/20  Yes [provider]  torsemide (DEMADEX) 20 MG tablet Take 2 tablets (40 mg total) by mouth 2 (two) times daily. Patient taking differently: Take 20 mg by mouth 2 (two) times daily. 05/16/20  Yes Minna Merritts, MD  traZODone (DESYREL) 150 MG tablet Take 150 mg by mouth at bedtime. 07/12/19  Yes [provider]  Vericiguat 5 MG TABS Take 5 mg by mouth daily.   Yes [provider]  albuterol (VENTOLIN HFA) 108 (90 Base) MCG/ACT inhaler Inhale into the lungs every 6 (six) hours as needed for wheezing or shortness of breath.    [provider]  DULoxetine (CYMBALTA) 20 MG capsule Take 30 mg by mouth daily.     [provider]  Ipratropium-Albuterol (COMBIVENT RESPIMAT) 20-100 MCG/ACT AERS respimat Inhale 1 puff into the lungs every 6 (six) hours as needed for wheezing.    [provider]  metolazone (ZAROXOLYN) 5 MG tablet Take 1 tablet (5 mg total) by mouth daily. Take 1 tablet today, Saturday & Sunday. Take additional potassium tablet those 3 days as well. 06/15/20 09/13/20  Alisa Graff, FNP   Allergies  Allergen Reactions   Dulaglutide Anaphylaxis, Diarrhea and Hives   Other Itching    Skin itching associated with nitro patch   Prednisone Rash    FAMILY HISTORY:  family history includes Heart attack in his father; Heart disease in his father; Hyperlipidemia in his father and mother;  Hypertension in his father and mother. SOCIAL HISTORY:  reports that he has been smoking cigarettes. He has a 41.00 pack-year smoking history. He has never used smokeless tobacco. He reports that he does not drink alcohol and does not use drugs.   Review of Systems:  Gen:  Denies  fever, sweats, chills weight loss  HEENT: Denies blurred vision, double vision, ear pain, eye pain, hearing loss, nose bleeds, sore throat Cardiac:  No dizziness, chest pain or heaviness, chest tightness,edema, No JVD Resp:   No cough, -sputum production, -shortness of breath,-wheezing, -hemoptysis,  Gi: Denies swallowing difficulty, stomach pain, nausea or vomiting, diarrhea, constipation, bowel incontinence Gu:  Denies bladder incontinence, burning urine Ext:   +swelling Skin: Denies  skin rash, easy bruising or bleeding or hives Endoc:  Denies polyuria, polydipsia , polyphagia or weight change Psych:   Denies depression, insomnia or hallucinations  Other:  All other systems negative'   Estimated body mass index is 32.73 kg/m as calculated from the following:   Height as of this encounter: 5\' 7"  (1.702 m).   Weight as of this encounter: 94.8 kg.    VITAL SIGNS: Temp:  [97.5 F (36.4 C)] 97.5 F (36.4 C) (01/09 0731) Pulse Rate:  [75-120] 94 (01/09 0731) Resp:  [14-24] 20 (01/09 0731) BP: (77-118)/(49-92) 96/68 (01/09 0731) SpO2:  [92 %-100 %] 100 % (01/09 0731)  I/O last 3 completed shifts: In: -  Out: 800 [Urine:800] No intake/output data recorded.   SpO2: 100 % O2 Flow Rate (L/min): 2 L/min   Physical Examination:  GENERAL:critically ill appearing, +resp distress HEAD: Normocephalic, atraumatic.  EYES: Pupils equal, round, reactive to light.  No scleral icterus.  MOUTH: Moist mucosal membrane. NECK: Supple. No JVD.  PULMONARY: +rhonchi, +wheezing CARDIOVASCULAR: S1 and S2. Regular rate and rhythm. No murmurs, rubs, or gallops.  GASTROINTESTINAL: Soft, nontender, -distended.   Positive bowel sounds.  MUSCULOSKELETAL: +edema.  NEUROLOGIC: alert and awake SKIN:intact,warm,dry   MEDICATIONS: I have reviewed all medications and confirmed regimen as documented   CULTURE RESULTS   Recent Results (from the past 240 hour(s))  Resp Panel by RT-PCR (Flu A&B, Covid)     Status: None   Collection Time: 06/17/20  6:00 AM  Result Value Ref Range Status   SARS Coronavirus 2 by RT PCR NEGATIVE NEGATIVE Final    Comment: (NOTE) SARS-CoV-2 target nucleic acids are NOT DETECTED.  The SARS-CoV-2 RNA is generally detectable in upper respiratory specimens during the acute phase of infection. The lowest concentration of SARS-CoV-2 viral copies this assay can detect is 138 copies/mL. A negative result does not preclude SARS-Cov-2 infection and should not be used as the sole basis for treatment or other patient management decisions. A negative result may occur with  improper specimen collection/handling, submission of specimen other than nasopharyngeal swab, presence of viral mutation(s) within the areas targeted by this assay, and inadequate number of viral copies(<138 copies/mL). A negative result must be combined with clinical observations, patient history, and epidemiological information. The expected result is Negative.  Fact Sheet for Patients:  EntrepreneurPulse.com.au  Fact Sheet for Healthcare Providers:  IncredibleEmployment.be  This test is no t yet approved or cleared by the [place] FDA and  has been authorized for detection and/or diagnosis of SARS-CoV-2 by FDA under an Emergency Use Authorization (EUA). This EUA will remain  in effect (meaning this test can be used) for the duration of the COVID-19 declaration under Section 564(b)(1) of the Act, 21 U.S.C.section 360bbb-3(b)(1), unless the authorization is terminated  or revoked sooner.       Influenza A by PCR NEGATIVE NEGATIVE Final   Influenza B by PCR  NEGATIVE NEGATIVE Final    Comment: (NOTE) The Xpert Xpress SARS-CoV-2/FLU/RSV plus assay is intended as an aid in the diagnosis of influenza from Nasopharyngeal swab specimens and should not be used as a sole basis for treatment. Nasal washings and aspirates are unacceptable for Xpert Xpress SARS-CoV-2/FLU/RSV testing.  Fact Sheet for Patients: EntrepreneurPulse.com.au  Fact Sheet for Healthcare Providers: IncredibleEmployment.be  This test is not yet approved or cleared by the [place] FDA and has been authorized for detection and/or diagnosis of SARS-CoV-2 by FDA under an Emergency Use Authorization (EUA). This EUA will remain in effect (meaning this test can be used) for the duration of the COVID-19 declaration under Section 564(b)(1) of the Act, 21 U.S.C. section 360bbb-3(b)(1), unless the authorization is terminated or revoked.  Performed at [facility], [address] [place], [place] [address]           IMAGING    ECHOCARDIOGRAM COMPLETE  Result Date: 06/17/2020    ECHOCARDIOGRAM REPORT   Patient Name:   [name] [name] Date of Exam: 06/17/2020 Medical Rec #:  [id number]      Height:       67.0 in Accession #:    [id number]  Weight:       209.0 lb Date of Birth:  [birth year]       BSA:          2.060 m Patient Age:    43 years       BP:           82/70 mmHg Patient Gender: M              HR:           95 bpm. Exam Location:  [facility] Procedure: 2D Echo Indications:     CHF-Acute Systolic A25.05  History:         Patient has prior history of Echocardiogram examinations, most                  recent 01/20/2020. CAD, COPD; Risk Factors:Hypertension,                  Diabetes and Dyslipidemia.  Sonographer:     Avanell Shackleton Referring Phys:  [id number] Athena Masse Diagnosing Phys: Ida Rogue MD IMPRESSIONS  1. Left ventricular ejection fraction, by estimation, is <20%. Left ventricular ejection fraction by PLAX is 14 %. The left  ventricle has severely decreased function. The left ventricle demonstrates global hypokinesis. The left ventricular internal cavity size was severely dilated. Left ventricular diastolic parameters are indeterminate.  2. Right ventricular systolic function is severely reduced. The right ventricular size is moderately enlarged. There is severely elevated pulmonary artery systolic pressure. The estimated right ventricular systolic pressure is 19.3 mmHg.  3. Left atrial size was severely dilated.  4. Right atrial size was severely dilated.  5. Tricuspid valve regurgitation is mild to moderate.  6. The inferior vena cava is dilated in size with <50% respiratory variability, suggesting right atrial pressure of 15 mmHg. FINDINGS  Left Ventricle: Left ventricular ejection fraction, by estimation, is <20%. Left ventricular ejection fraction by PLAX is 14 %. The left ventricle has severely decreased function. The left ventricle demonstrates global hypokinesis. The left ventricular internal cavity size was severely dilated. There is no left ventricular hypertrophy. Left ventricular diastolic parameters are indeterminate. Right Ventricle: The right ventricular size is moderately enlarged. No increase in right ventricular wall thickness. Right ventricular systolic function is severely reduced. There is severely elevated pulmonary artery systolic pressure. The tricuspid regurgitant velocity is 2.88 m/s, and with an assumed right atrial pressure of 20 mmHg, the estimated right ventricular systolic pressure is 79.0 mmHg. Left Atrium: Left atrial size was severely dilated. Right Atrium: Right atrial size was severely dilated. Pericardium: There is no evidence of pericardial effusion. Mitral Valve: The mitral valve is normal in structure. Mild mitral valve regurgitation. No evidence of mitral valve stenosis. Tricuspid Valve: The tricuspid valve is normal in structure. Tricuspid valve regurgitation is mild to moderate. No evidence of  tricuspid stenosis. Aortic Valve: The aortic valve was not well visualized. Aortic valve regurgitation is not visualized. No aortic stenosis is present. Aortic valve mean gradient measures 4.0 mmHg. Aortic valve peak gradient measures 6.7 mmHg. Aortic valve area, by VTI measures 1.02 cm. Pulmonic Valve: The pulmonic valve was normal in structure. Pulmonic valve regurgitation is mild. No evidence of pulmonic stenosis. Aorta: The aortic root is normal in size and structure. Venous: The inferior vena cava is dilated in size with less than 50% respiratory variability, suggesting right atrial pressure of 15 mmHg. IAS/Shunts: No atrial level shunt detected by color flow Doppler.  LEFT VENTRICLE PLAX 2D LV EF:  Left ventricular ejection fraction by PLAX is 14 %. LVIDd:         6.64 cm LVIDs:         6.20 cm LV PW:         1.09 cm LV IVS:        1.22 cm LVOT diam:     2.10 cm LV SV:         18 LV SV Index:   9 LVOT Area:     3.46 cm  LV Volumes (MOD) LV vol d, MOD A2C: 299.0 ml LV vol d, MOD A4C: 284.0 ml LV vol s, MOD A2C: 258.0 ml LV vol s, MOD A4C: 243.0 ml LV SV MOD A2C:     41.0 ml LV SV MOD A4C:     284.0 ml LV SV MOD BP:      40.8 ml IVC IVC diam: 2.73 cm LEFT ATRIUM             Index       RIGHT ATRIUM           Index LA diam:        5.80 cm 2.81 cm/m  RA Area:     31.80 cm LA Vol (A2C):   78.7 ml 38.20 ml/m RA Volume:   130.00 ml 63.09 ml/m LA Vol (A4C):   75.4 ml 36.59 ml/m LA Biplane Vol: 77.1 ml 37.42 ml/m  AORTIC VALVE AV Area (Vmax):    1.07 cm AV Area (Vmean):   1.02 cm AV Area (VTI):     1.02 cm AV Vmax:           129.67 cm/s AV Vmean:          93.300 cm/s AV VTI:            0.181 m AV Peak Grad:      6.7 mmHg AV Mean Grad:      4.0 mmHg LVOT Vmax:         40.20 cm/s LVOT Vmean:        27.600 cm/s LVOT VTI:          0.053 m LVOT/AV VTI ratio: 0.29  AORTA Ao Root diam: 3.20 cm MR Peak grad: 92.2 mmHg   TRICUSPID VALVE MR Mean grad: 51.0 mmHg   TR Peak grad:   33.2 mmHg MR Vmax:      480.00  cm/s TR Vmax:        288.00 cm/s MR Vmean:     329.0 cm/s                           SHUNTS                           Systemic VTI:  0.05 m                           Systemic Diam: 2.10 cm Ida Rogue MD Electronically signed by Ida Rogue MD Signature Date/Time: 06/17/2020/7:04:59 PM    Final         ASSESSMENT AND PLAN SYNOPSIS  60 yo white male with multiple medical issues with severe and acute systolic CHF exacerbation with shock with progressive cardiorenal syndrome  ACUTE SYSTOLIC CARDIAC FAILURE- EF <20% To be admitted to ICU for placement of CVL and milrinone infusion Prognosis is extremely poor   Morbid obesity, possible OSA.  Will certainly impact respiratory mechanics  ACUTE KIDNEY INJURY/Renal Failure -continue Foley Catheter-assess need -Avoid nephrotoxic agents -Follow urine output, BMP -Ensure adequate renal perfusion, optimize oxygenation -Renal dose medications   SHOCK-CARDIOGENIC -use vasopressors to keep MAP>65 -follow ABG and LA  CARDIAC ICU monitoring  GI GI PROPHYLAXIS as indicated  NUTRITIONAL STATUS DIET--> as tolerated Constipation protocol as indicated   ENDO - will use ICU hypoglycemic\Hyperglycemia protocol if needed    ELECTROLYTES -follow labs as needed -replace as needed -pharmacy consultation and following    DVT/GI PRX ordered and assessed TRANSFUSIONS AS NEEDED MONITOR FSBS I Assessed the need for Labs I Assessed the need for Foley I Assessed the need for Central Venous Line Family Discussion when available I Assessed the need for Mobilization I made an Assessment of medications to be adjusted accordingly Safety Risk assessment Completed  CASE DISCUSSED IN MULTIDISCIPLINARY ROUNDS WITH ICU TEAM   Critical Care Time devoted to patient care services described in this note is 65  minutes.   Overall, patient is critically ill, prognosis is guarded.  Patient with Multiorgan failure and at high risk for cardiac  arrest and death.    Corrin Parker, M.D.  [facility]  Medical Director [place] Director [facility] Cardio-Pulmonary Department

## 2020-06-18 NOTE — Procedures (Addendum)
Central Venous Catheter Insertion Procedure Note  [name] [name]  [id number]  [birth year]  Date:06/18/20  Time:1:27 PM   Provider Performing:Baldwin Racicot   Procedure: Insertion of Non-tunneled Central Venous [phone]) with US guidance ([id number])   Indication(s) Medication administration  Consent Risks of the procedure as well as the alternatives and risks of each were explained to the patient and/or caregiver.  Consent for the procedure was obtained and is signed in the bedside chart  Anesthesia Topical only with 1% lidocaine   Timeout Verified patient identification, verified procedure, site/side was marked, verified correct patient position, special equipment/implants available, medications/allergies/relevant history reviewed, required imaging and test results available.  Sterile Technique Maximal sterile technique including full sterile barrier drape, hand hygiene, sterile gown, sterile gloves, mask, hair covering, sterile ultrasound probe cover (if used).  Procedure Description Area of catheter insertion was cleaned with chlorhexidine and draped in sterile fashion. Ultrasound was used to visualize the venous anatomy.  Ultrasound was used to visualize needle entry into the vessel in real-time.  With real-time ultrasound guidance a central venous catheter was placed into the left internal jugular vein. Nonpulsatile blood flow and easy flushing noted in all ports.  The catheter was sutured in place and sterile dressing applied.  Complications/Tolerance None; patient tolerated the procedure well. Chest X-ray is ordered to verify placement for internal jugular.  Tip is in the appropriate position in SVC  EBL Minimal  Specimen(s) None    Marshell Garfinkel MD [facility] Please see [email or web address] for pager details.  06/18/2020, 1:28 PM

## 2020-06-18 NOTE — ED Notes (Signed)
Pt adjusted in bed and given a diet cola.

## 2020-06-18 NOTE — Progress Notes (Signed)
OT Cancellation Note  Patient Details Name: [name] [name] MRN: [id number] DOB: 07-04-1960   Cancelled Treatment:    Reason Eval/Treat Not Completed: Medical issues which prohibited therapy   OT consult received.  Pt transferred to ICU this date for medical procedure and start of milrinone drip.  Will re-assess pt's status and consult with team tomorrow re: considerations to start therapy.  Myrtie Hawk Derra Shartzer, OTR/L 06/18/20, 2:49 PM

## 2020-06-18 NOTE — ED Notes (Signed)
Pt will respond verbally to questions asked but eyes remained closed. Denies any needs at this time. Will continue to monitor.

## 2020-06-19 ENCOUNTER — Encounter (INDEPENDENT_AMBULATORY_CARE_PROVIDER_SITE_OTHER): Payer: Self-pay | Admitting: Nurse Practitioner

## 2020-06-19 ENCOUNTER — Ambulatory Visit: Payer: Medicare Other

## 2020-06-19 DIAGNOSIS — Z720 Tobacco use: Secondary | ICD-10-CM

## 2020-06-19 DIAGNOSIS — I5023 Acute on chronic systolic (congestive) heart failure: Secondary | ICD-10-CM | POA: Diagnosis not present

## 2020-06-19 DIAGNOSIS — I509 Heart failure, unspecified: Secondary | ICD-10-CM | POA: Diagnosis not present

## 2020-06-19 DIAGNOSIS — I130 Hypertensive heart and chronic kidney disease with heart failure and stage 1 through stage 4 chronic kidney disease, or unspecified chronic kidney disease: Secondary | ICD-10-CM

## 2020-06-19 DIAGNOSIS — Z66 Do not resuscitate: Secondary | ICD-10-CM

## 2020-06-19 DIAGNOSIS — J810 Acute pulmonary edema: Secondary | ICD-10-CM

## 2020-06-19 DIAGNOSIS — Z7189 Other specified counseling: Secondary | ICD-10-CM

## 2020-06-19 DIAGNOSIS — J432 Centrilobular emphysema: Secondary | ICD-10-CM

## 2020-06-19 DIAGNOSIS — I25118 Atherosclerotic heart disease of native coronary artery with other forms of angina pectoris: Secondary | ICD-10-CM

## 2020-06-19 DIAGNOSIS — Z515 Encounter for palliative care: Secondary | ICD-10-CM | POA: Diagnosis not present

## 2020-06-19 LAB — BASIC METABOLIC PANEL
Anion gap: 9 (ref 5–15)
BUN: 48 mg/dL — ABNORMAL HIGH (ref 6–20)
CO2: 26 mmol/L (ref 22–32)
Calcium: 9.1 mg/dL (ref 8.9–10.3)
Chloride: 92 mmol/L — ABNORMAL LOW (ref 98–111)
Creatinine, Ser: 1.57 mg/dL — ABNORMAL HIGH (ref 0.61–1.24)
GFR, Estimated: 51 mL/min — ABNORMAL LOW (ref >60)
Glucose, Bld: 134 mg/dL — ABNORMAL HIGH (ref 70–99)
Potassium: 5 mmol/L (ref 3.5–5.1)
Sodium: 127 mmol/L — ABNORMAL LOW (ref 135–145)

## 2020-06-19 LAB — GLUCOSE, CAPILLARY
Glucose-Capillary: 142 mg/dL — ABNORMAL HIGH (ref 70–99)
Glucose-Capillary: 149 mg/dL — ABNORMAL HIGH (ref 70–99)
Glucose-Capillary: 165 mg/dL — ABNORMAL HIGH (ref 70–99)
Glucose-Capillary: 165 mg/dL — ABNORMAL HIGH (ref 70–99)
Glucose-Capillary: 225 mg/dL — ABNORMAL HIGH (ref 70–99)
Glucose-Capillary: 254 mg/dL — ABNORMAL HIGH (ref 70–99)
Glucose-Capillary: 88 mg/dL (ref 70–99)

## 2020-06-19 LAB — COOXEMETRY PANEL
Carboxyhemoglobin: 1.9 % — ABNORMAL HIGH (ref 0.5–1.5)
Methemoglobin: 0.6 % (ref 0.0–1.5)
O2 Saturation: 78.7 %

## 2020-06-19 LAB — CBC
HCT: 33.2 % — ABNORMAL LOW (ref 39.0–52.0)
Hemoglobin: 11.3 g/dL — ABNORMAL LOW (ref 13.0–17.0)
MCH: 27.1 pg (ref 26.0–34.0)
MCHC: 34 g/dL (ref 30.0–36.0)
MCV: 79.6 fL — ABNORMAL LOW (ref 80.0–100.0)
Platelets: 131 10*3/uL — ABNORMAL LOW (ref 150–400)
RBC: 4.17 MIL/uL — ABNORMAL LOW (ref 4.22–5.81)
RDW: 21.5 % — ABNORMAL HIGH (ref 11.5–15.5)
WBC: 7.1 10*3/uL (ref 4.0–10.5)
nRBC: 0 % (ref 0.0–0.2)

## 2020-06-19 LAB — PHOSPHORUS: Phosphorus: 3.9 mg/dL (ref 2.5–4.6)

## 2020-06-19 LAB — MAGNESIUM: Magnesium: 2.2 mg/dL (ref 1.7–2.4)

## 2020-06-19 MED ORDER — LOSARTAN POTASSIUM 25 MG PO TABS
12.5000 mg | ORAL_TABLET | Freq: Every day | ORAL | Status: DC
  Administered 2020-06-19: 12.5 mg via ORAL
  Filled 2020-06-19 (×2): qty 0.5

## 2020-06-19 NOTE — Progress Notes (Signed)
[facility]) Hospital Liaison RN note:  This patient is enrolled in our palliative services in the community. [facility] Liaison will follow for any discharge planning needs and to coordinate continuation of palliative care. Teresita Maxey, TOC is aware.  Thank you.  Zandra Abts, RN [facility] Liaison [phone]

## 2020-06-19 NOTE — Progress Notes (Signed)
OR for Advance Directive. Patient was awake, nurse was present during visit, administering medications. I advised patient of reason for visit, he stated he had completed one of these. I had opportunity to review chart prior to visit, Palliative Care noted the name of elected person patient has designated, he also wanted her to be present, she had not arrived during my visit. Consulting civil engineer, nurse and myself checked for previous AD, nothing in physical chart, nor in Epic. I left copy with patient and asked him to allow staff to contact Chaplain to facilitate this AD being notarized.

## 2020-06-19 NOTE — Evaluation (Signed)
Occupational Therapy Evaluation Patient Details Name: [name] [name] MRN: [id number] DOB: 1960/08/08 Today's Date: 06/19/2020    History of Present Illness Pt is a 60 y/o M with PMH: Arthritis, Asthma, Atherosclerosis, BPH, Charcot's joint of foot-right, Chronic combined systolic and diastolic heart failure, COPD, CAD/PCI, tobacco abuse, PAD s/p L SFA stenting, s/p R BKA, afib on Eliquis, OSA noncompliant with CPAP, and h/o leaving AMA/medical noncompliance. Pt presented d/t SOB, DOE and fluid retention in LEs. Trop slightly elevated, Cardiology note indicates: "patient declined CABG in the past at [facility]; He had a cath in August showing no great PCI targets but considered atherectomy of Lcx however patient eventually left AMA, No plan for ischemic work-up at this time". Pt currently in ICU for milrinone drip. RN okays light/limited OT evaluation.   Clinical Impression   Pt seen for limited OT evaluation this date in setting of acute hospital stay with SOB and currently in ICU for HR mgt. Pt reports being MOD I with ADL transfers to/from wheelchair at baseline and lives in a [facility] with ramped entrance with his significant other. Pt requires SETUP for bed level UB ADLs and MOD/MAX A for bed level LB ADLs. Pt declines to participate in transfers with OT at this time, even just transitioning to sitting. Pt is noted to have taken nasal cannula off while eating which OT replaces to improve O2 saturation (pt reports not O2 at home). Pt is agreeable to bed-level UB therex (listed below). Pt left in bed with all needs met and RN notified of session as well as pt HR up to 124 with bed level activity. Anticipate pt will require STR to restore function and strength, but he has indicated he would prefer to go home. If so, anticipate he may require HHOT and aide at least upon initial d/c d/t decreased fxl activity tolerance.     Follow Up Recommendations  SNF    Equipment Recommendations  None recommended by OT (pt  reports ahving all necessary equipment.)    Recommendations for Other Services       Precautions / Restrictions Precautions Precautions: Fall Restrictions Weight Bearing Restrictions: No Other Position/Activity Restrictions: monitor HR      Mobility Bed Mobility               General bed mobility comments: deferred, pt declines to come to sitting at this time.    Transfers                 General transfer comment: deferred d/t HR    Balance                                           ADL either performed or assessed with clinical judgement   ADL                                         General ADL Comments: SETUP to MIN A for bed level UB ADLs in high-fowler's position. Pt requires MOD/MAX A for bed level LB ADLs at this time. Declines OTs attempts to motivate pt to participate in ADL trasnfers at this time, even to EOB sitting. To be fair, his HR is variable throughout bed level assessment, from 104bpm to 125bpm at rest.     Vision Patient Visual  Report: No change from baseline       Perception     Praxis      Pertinent Vitals/Pain Pain Assessment: No/denies pain (vague c/o discomofrt from being in bed)     Hand Dominance Right   Extremity/Trunk Assessment Upper Extremity Assessment Upper Extremity Assessment: Overall WFL for tasks assessed;Generalized weakness (ROM WFL, but pt very limited with resisting a challenge. Strength is grossly 4-/5 throughout.)   Lower Extremity Assessment Lower Extremity Assessment: Defer to PT evaluation;Generalized weakness (h/o R BKA)       Communication Communication Communication: No difficulties   Cognition Arousal/Alertness: Awake/alert Behavior During Therapy: WFL for tasks assessed/performed Overall Cognitive Status: Within Functional Limits for tasks assessed                                 General Comments: Pt is oriented and moderately participatory  with OT. He does take maximum effort to motivate to participate, but is able to appropriately follow all commands once rapport established.   General Comments       Exercises Other Exercises Other Exercises: OT educates re: role of OT, importance of OOB activity, OT POC/goals. Pt reluctant but agreeable once rapport established. Other Exercises: OT engages pt in 1 set x5 reps straigh arm raises and 1 set x5 reps contralateral reaching. Pt reluctant to participate, but agreeable with education. Visual cues for form.   Shoulder Instructions      Home Living Family/patient expects to be discharged to:: Private residence Living Arrangements: Spouse/significant other Available Help at Discharge: Family Type of Home: House Home Access: [place]: One level     Bathroom Shower/Tub: [place]: Environmental consultant - 2 wheels;Cane - single point;Wheelchair - Brewing technologist;Toilet riser          Prior Functioning/Environment Level of Independence: Needs assistance  Gait / Transfers Assistance Needed: Pt reports w/c use at baseline for fxl mobility, does ambulate short distances wtih his prosthetic and UE support-describes furniture cruising. Pt states he is able to transfer himself. ADL's / Homemaking Assistance Needed: Pt states he is able to perform all basic self care I'ly, his significant other performs HH IADLs such as cooking, cleaning, and laundry.            OT Problem List: Decreased strength;Decreased activity tolerance;Impaired balance (sitting and/or standing)      OT Treatment/Interventions: Self-care/ADL training;DME and/or AE instruction;Therapeutic activities;Balance training    OT Goals(Current goals can be found in the care plan section) Acute Rehab OT Goals Patient Stated Goal: to go home OT Goal Formulation: With patient Time For Goal Achievement: 07/03/20 Potential to Achieve Goals: Fair ADL Goals Pt Will Perform  Lower Body Dressing: with supervision;sitting/lateral leans Pt Will Transfer to Toilet: with supervision;squat pivot transfer;bedside commode Pt/caregiver will Perform Home Exercise Program: Increased strength;Both right and left upper extremity;With Supervision  OT Frequency: Min 1X/week   Barriers to D/C:            Co-evaluation              AM-PAC OT "6 Clicks" Daily Activity     Outcome Measure Help from another person eating meals?: None Help from another person taking care of personal grooming?: A Little Help from another person toileting, which includes using toliet, bedpan, or urinal?: A Lot Help from another person bathing (including washing,  rinsing, drying)?: A Lot Help from another person to put on and taking off regular upper body clothing?: A Little Help from another person to put on and taking off regular lower body clothing?: A Lot 6 Click Score: 16   End of Session Equipment Utilized During Treatment: Oxygen Nurse Communication: Other (comment) (HR throughout session)  Activity Tolerance: Treatment limited secondary to medical complications (Comment) Patient left: in bed;with call bell/phone within reach  OT Visit Diagnosis: Unsteadiness on feet (R26.81);Muscle weakness (generalized) (M62.81)                Time: 8676-1950 OT Time Calculation (min): 32 min Charges:  OT General Charges $OT Visit: 1 Visit OT Evaluation $OT Eval Moderate Complexity: 1 Mod OT Treatments $Self Care/Home Management : 8-22 mins $Therapeutic Activity: 8-22 mins  Gerrianne Scale, MS, OTR/L ascom [phone] 06/19/20, 1:37 PM

## 2020-06-19 NOTE — Progress Notes (Signed)
[address] [place], [place] [address] Phone [phone]. Fax [phone]  Date: 06/19/2020             Hospital Course: Patient is a 60 y.o. male  admitted to [facility] on 06/17/2020 for evaluation of Bilirubinemia [E80.6] CHF (congestive heart failure) ([place]) [I50.9] Hyponatremia [E87.1] Tobacco abuse [Z72.0] Acute pulmonary edema (HCC) [J81.0] SOB (shortness of breath) [R06.02] CHF exacerbation (HCC) [I50.9] Troponin I above reference range [R77.8] AKI (acute kidney injury) ([place]) [N17.9] Chest pain [R07.9] Acute on chronic heart failure, unspecified heart failure type ([place]) [I50.9]  Patient has significant coronary artery disease, with history of LAD, RCA stent, last cath on August 2021 showing multivessel disease, chronic systolic CHF with EF less than 20%(01/20/2020), diabetes with neuropathy, peripheral arterial disease status post right BKA, atrial fibrillation on Eliquis, COPD, tobacco abuse. Presented to the emergency room with shortness of breath, lower extremity edema Baseline creatinine of 1.10, presenting creatinine of 1.50 on January 8. Also noted to have low sodium of 125  1/9- lasix drip started and patient moved to icu for ionotropes    Today, Patient is doing fair States he is able to eat without nausea or vomiting Continued on lasix drip with good UOP Milrinone started.    Vital Signs: Blood pressure 129/82, pulse 100, temperature 98.3 F (36.8 C), temperature source Oral, resp. rate (!) 22, height 5\' 7"  (1.702 m), weight 104.1 kg, SpO2 90 %.   Intake/Output Summary (Last 24 hours) at 06/19/2020 1127 Last data filed at 06/19/2020 1044 Gross per 24 hour  Intake 133.34 ml  Output 2725 ml  Net -2591.66 ml    Weight trends: Filed Weights   06/17/20 0036 06/19/20 0500  Weight: 94.8 kg 104.1 kg    Physical Exam: General:  Laying in the bed, no acute distress  HEENT  moist oral mucous membranes, anicteric  Neck:  Supple, no masses  Lungs:  Mild  basilar crackles, [place] O2  Heart::  Irregular, tachycardic, 2/6 systolic murmur  Abdomen:  Distended, ascites  Extremities:  Tight lower extremity edema in the left up to abdomen, right BKA  Neurologic:  Alert and oriented  Skin:  Congestive changes in the left leg    Lab results: Basic Metabolic Panel: Recent Labs  Lab 06/17/20 0130 06/18/20 0412 06/19/20 0347  NA 125* 125* 127*  K 5.2* 5.5* 5.0  CL 90* 92* 92*  CO2 25 24 26   GLUCOSE 124* 134* 134*  BUN 34* 40* 48*  CREATININE 1.50* 1.58* 1.57*  CALCIUM 9.1 9.2 9.1  MG  --   --  2.2  PHOS  --   --  3.9    Liver Function Tests: Recent Labs  Lab 06/17/20 0130  AST 81*  ALT 37  ALKPHOS 114  BILITOT 3.7*  PROT 7.0  ALBUMIN 3.8   No results for input(s): LIPASE, AMYLASE in the last 168 hours. No results for input(s): AMMONIA in the last 168 hours.  CBC: Recent Labs  Lab 06/17/20 0049 06/19/20 0347  WBC 8.3 7.1  HGB 13.1 11.3*  HCT 40.3 33.2*  MCV 82.6 79.6*  PLT 184 131*    Cardiac Enzymes: No results for input(s): CKTOTAL, TROPONINI in the last 168 hours.  BNP: Invalid input(s): POCBNP  CBG: Recent Labs  Lab 06/18/20 1941 06/19/20 0000 06/19/20 0310 06/19/20 0808 06/19/20 1111  GLUCAP 191* 149* 165* 88 165*    Microbiology: Recent Results (from the past 720 hour(s))  Resp Panel by RT-PCR (Flu A&B, Covid)  Status: None   Collection Time: 06/17/20  6:00 AM  Result Value Ref Range Status   SARS Coronavirus 2 by RT PCR NEGATIVE NEGATIVE Final    Comment: (NOTE) SARS-CoV-2 target nucleic acids are NOT DETECTED.  The SARS-CoV-2 RNA is generally detectable in upper respiratory specimens during the acute phase of infection. The lowest concentration of SARS-CoV-2 viral copies this assay can detect is 138 copies/mL. A negative result does not preclude SARS-Cov-2 infection and should not be used as the sole basis for treatment or other patient management decisions. A negative result may occur  with  improper specimen collection/handling, submission of specimen other than nasopharyngeal swab, presence of viral mutation(s) within the areas targeted by this assay, and inadequate number of viral copies(<138 copies/mL). A negative result must be combined with clinical observations, patient history, and epidemiological information. The expected result is Negative.  Fact Sheet for Patients:  EntrepreneurPulse.com.au  Fact Sheet for Healthcare Providers:  IncredibleEmployment.be  This test is no t yet approved or cleared by the [place] FDA and  has been authorized for detection and/or diagnosis of SARS-CoV-2 by FDA under an Emergency Use Authorization (EUA). This EUA will remain  in effect (meaning this test can be used) for the duration of the COVID-19 declaration under Section 564(b)(1) of the Act, 21 U.S.C.section 360bbb-3(b)(1), unless the authorization is terminated  or revoked sooner.       Influenza A by PCR NEGATIVE NEGATIVE Final   Influenza B by PCR NEGATIVE NEGATIVE Final    Comment: (NOTE) The Xpert Xpress SARS-CoV-2/FLU/RSV plus assay is intended as an aid in the diagnosis of influenza from Nasopharyngeal swab specimens and should not be used as a sole basis for treatment. Nasal washings and aspirates are unacceptable for Xpert Xpress SARS-CoV-2/FLU/RSV testing.  Fact Sheet for Patients: EntrepreneurPulse.com.au  Fact Sheet for Healthcare Providers: IncredibleEmployment.be  This test is not yet approved or cleared by the [place] FDA and has been authorized for detection and/or diagnosis of SARS-CoV-2 by FDA under an Emergency Use Authorization (EUA). This EUA will remain in effect (meaning this test can be used) for the duration of the COVID-19 declaration under Section 564(b)(1) of the Act, 21 U.S.C. section 360bbb-3(b)(1), unless the authorization is terminated  or revoked.  Performed at [facility], [address] [place], [place] [address]      Coagulation Studies: No results for input(s): LABPROT, INR in the last 72 hours.  Urinalysis: No results for input(s): COLORURINE, LABSPEC, PHURINE, GLUCOSEU, HGBUR, BILIRUBINUR, KETONESUR, PROTEINUR, UROBILINOGEN, NITRITE, LEUKOCYTESUR in the last 72 hours.  Invalid input(s): APPERANCEUR      Imaging: DG Chest Port 1 View  Result Date: 06/18/2020 CLINICAL DATA:  Shortness of breath.  COPD.  Central line placement. EXAM: PORTABLE CHEST 1 VIEW COMPARISON:  06/17/2020 FINDINGS: New left jugular central venous catheter is seen with tip overlying the distal left brachiocephalic vein, near the midline. No evidence of pneumothorax. Cardiomegaly remains stable. Diffuse pulmonary vascular congestion is unchanged. No evidence of pulmonary consolidation or pleural effusion. IMPRESSION: New left jugular central venous catheter tip overlies the distal left brachiocephalic vein. No evidence of pneumothorax. Stable cardiomegaly and diffuse pulmonary vascular congestion. Electronically Signed   By: Marlaine Hind M.D.   On: 06/18/2020 14:17   ECHOCARDIOGRAM COMPLETE  Result Date: 06/17/2020    ECHOCARDIOGRAM REPORT   Patient Name:   [name] [name] Date of Exam: 06/17/2020 Medical Rec #:  [id number]      Height:  67.0 in Accession #:    [id number]     Weight:       209.0 lb Date of Birth:  08-17-1960       BSA:          2.060 m Patient Age:    42 years       BP:           82/70 mmHg Patient Gender: M              HR:           95 bpm. Exam Location:  [facility] Procedure: 2D Echo Indications:     CHF-Acute Systolic H20.94  History:         Patient has prior history of Echocardiogram examinations, most                  recent 01/20/2020. CAD, COPD; Risk Factors:Hypertension,                  Diabetes and Dyslipidemia.  Sonographer:     Avanell Shackleton Referring Phys:  [id number] Athena Masse Diagnosing Phys: Ida Rogue  MD IMPRESSIONS  1. Left ventricular ejection fraction, by estimation, is <20%. Left ventricular ejection fraction by PLAX is 14 %. The left ventricle has severely decreased function. The left ventricle demonstrates global hypokinesis. The left ventricular internal cavity size was severely dilated. Left ventricular diastolic parameters are indeterminate.  2. Right ventricular systolic function is severely reduced. The right ventricular size is moderately enlarged. There is severely elevated pulmonary artery systolic pressure. The estimated right ventricular systolic pressure is 66.2 mmHg.  3. Left atrial size was severely dilated.  4. Right atrial size was severely dilated.  5. Tricuspid valve regurgitation is mild to moderate.  6. The inferior vena cava is dilated in size with <50% respiratory variability, suggesting right atrial pressure of 15 mmHg. FINDINGS  Left Ventricle: Left ventricular ejection fraction, by estimation, is <20%. Left ventricular ejection fraction by PLAX is 14 %. The left ventricle has severely decreased function. The left ventricle demonstrates global hypokinesis. The left ventricular internal cavity size was severely dilated. There is no left ventricular hypertrophy. Left ventricular diastolic parameters are indeterminate. Right Ventricle: The right ventricular size is moderately enlarged. No increase in right ventricular wall thickness. Right ventricular systolic function is severely reduced. There is severely elevated pulmonary artery systolic pressure. The tricuspid regurgitant velocity is 2.88 m/s, and with an assumed right atrial pressure of 20 mmHg, the estimated right ventricular systolic pressure is 94.7 mmHg. Left Atrium: Left atrial size was severely dilated. Right Atrium: Right atrial size was severely dilated. Pericardium: There is no evidence of pericardial effusion. Mitral Valve: The mitral valve is normal in structure. Mild mitral valve regurgitation. No evidence of mitral  valve stenosis. Tricuspid Valve: The tricuspid valve is normal in structure. Tricuspid valve regurgitation is mild to moderate. No evidence of tricuspid stenosis. Aortic Valve: The aortic valve was not well visualized. Aortic valve regurgitation is not visualized. No aortic stenosis is present. Aortic valve mean gradient measures 4.0 mmHg. Aortic valve peak gradient measures 6.7 mmHg. Aortic valve area, by VTI measures 1.02 cm. Pulmonic Valve: The pulmonic valve was normal in structure. Pulmonic valve regurgitation is mild. No evidence of pulmonic stenosis. Aorta: The aortic root is normal in size and structure. Venous: The inferior vena cava is dilated in size with less than 50% respiratory variability, suggesting right atrial pressure of 15 mmHg. IAS/Shunts: No atrial level shunt detected by  color flow Doppler.  LEFT VENTRICLE PLAX 2D LV EF:         Left ventricular ejection fraction by PLAX is 14 %. LVIDd:         6.64 cm LVIDs:         6.20 cm LV PW:         1.09 cm LV IVS:        1.22 cm LVOT diam:     2.10 cm LV SV:         18 LV SV Index:   9 LVOT Area:     3.46 cm  LV Volumes (MOD) LV vol d, MOD A2C: 299.0 ml LV vol d, MOD A4C: 284.0 ml LV vol s, MOD A2C: 258.0 ml LV vol s, MOD A4C: 243.0 ml LV SV MOD A2C:     41.0 ml LV SV MOD A4C:     284.0 ml LV SV MOD BP:      40.8 ml IVC IVC diam: 2.73 cm LEFT ATRIUM             Index       RIGHT ATRIUM           Index LA diam:        5.80 cm 2.81 cm/m  RA Area:     31.80 cm LA Vol (A2C):   78.7 ml 38.20 ml/m RA Volume:   130.00 ml 63.09 ml/m LA Vol (A4C):   75.4 ml 36.59 ml/m LA Biplane Vol: 77.1 ml 37.42 ml/m  AORTIC VALVE AV Area (Vmax):    1.07 cm AV Area (Vmean):   1.02 cm AV Area (VTI):     1.02 cm AV Vmax:           129.67 cm/s AV Vmean:          93.300 cm/s AV VTI:            0.181 m AV Peak Grad:      6.7 mmHg AV Mean Grad:      4.0 mmHg LVOT Vmax:         40.20 cm/s LVOT Vmean:        27.600 cm/s LVOT VTI:          0.053 m LVOT/AV VTI ratio: 0.29   AORTA Ao Root diam: 3.20 cm MR Peak grad: 92.2 mmHg   TRICUSPID VALVE MR Mean grad: 51.0 mmHg   TR Peak grad:   33.2 mmHg MR Vmax:      480.00 cm/s TR Vmax:        288.00 cm/s MR Vmean:     329.0 cm/s                           SHUNTS                           Systemic VTI:  0.05 m                           Systemic Diam: 2.10 cm Ida Rogue MD Electronically signed by Ida Rogue MD Signature Date/Time: 06/17/2020/7:04:59 PM    Final     Scheduled Meds: . apixaban  5 mg Oral BID  . atorvastatin  80 mg Oral Daily  . Chlorhexidine Gluconate Cloth  6 each Topical Daily  . clopidogrel  75 mg Oral Daily  . DULoxetine  30 mg  Oral Daily  . ezetimibe  10 mg Oral Daily  . gabapentin  400 mg Oral QID  . insulin aspart  0-20 Units Subcutaneous Q4H  . losartan  12.5 mg Oral Daily  . methylPREDNISolone (SOLU-MEDROL) injection  40 mg Intravenous Daily  . midodrine  5 mg Oral TID WC  . mometasone-formoterol  2 puff Inhalation BID  . multivitamin with minerals  1 tablet Oral Daily  . pantoprazole  40 mg Oral Daily  . sodium chloride flush  3 mL Intravenous Q12H  . tamsulosin  0.4 mg Oral QPC supper  . traZODone  150 mg Oral QHS  . umeclidinium bromide  1 puff Inhalation Daily  . Vericiguat  5 mg Oral Daily   Continuous Infusions: . sodium chloride    . furosemide (LASIX) 200 mg in dextrose 5% 100 mL (2mg /mL) infusion 8 mg/hr (06/19/20 0801)  . milrinone 0.25 mcg/kg/min (06/19/20 0600)   PRN Meds:.sodium chloride, acetaminophen, Ipratropium-Albuterol, ipratropium-albuterol, nitroGLYCERIN, ondansetron (ZOFRAN) IV, oxyCODONE, sodium chloride flush  Assessment & Plan: Pt is a 60 y.o.   male with history of CAD including stent in  LAD, RCA, last cath on August 2021 showing multivessel disease, chronic systolic CHF with EF less than 20%(01/20/2020), diabetes with neuropathy, peripheral arterial disease status post right BKA, atrial fibrillation on Eliquis, COPD, tobacco abuse, was admitted on 06/17/2020  with Bilirubinemia [E80.6] CHF (congestive heart failure) ([place]) [I50.9] Hyponatremia [E87.1] Tobacco abuse [Z72.0] Acute pulmonary edema ([place]) [J81.0] SOB (shortness of breath) [R06.02] CHF exacerbation ([place]) [I50.9] Troponin I above reference range [R77.8] AKI (acute kidney injury) ([place]) [N17.9] Chest pain [R07.9] Acute on chronic heart failure, unspecified heart failure type ([place]) [I50.9]   1. AKI with volume overload 2. Hyponatremia 3.  Acute exacerbation of chronic Systolic CHF 4. Cirrhosis 5.  Hypotension 6.  Atrial fibrillation  Patient presents with volume overload, exacerbation of chronic systolic CHF.  Diuresis is limited because of low blood pressure and atrial fibrillation. Last 2D echo from January 20, 2020 shows severely decreased LV function with global hypokinesis moderately enlarged right ventricle, moderate tricuspid regurgitation Hyponatremia likely due to volume overload  Plan: Urinalysis pending continue on IV Lasix infusion Currently Albumin is 3.8, if goes lower, will consider IV albumin   Continue midodrine  Cardiologist started inotropic therapy Flomax for urinary hesitancy Avoid hypotension We will follow      LOS: 2 Oralia Criger 1/10/202211:27 AM    Note: This note was prepared with Dragon dictation. Any transcription errors are unintentional

## 2020-06-19 NOTE — Consult Note (Signed)
Consultation Note Date: 06/19/2020   Patient Name: [name] [name]  DOB: 05/19/1961  MRN: [id number]  Age / Sex: 60 y.o., male  PCP: [name], MD Referring Physician: Flora Lipps, MD  Reason for Consultation: Establishing goals of care  HPI/Patient Profile: 60 y.o. 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 Outpatient palliative care visit noted from 05/29/20 with plans for consideration of DNR status and reviewing MOST form and HCPOA but not yet completed.   Clinical Assessment and Goals of Care: I met today with Dorell but no family at bedside. We discussed his current status and concern for severe heart disease that is only expected to worsen over time. We discussed his hopes and goals in the setting of terminal illness. At this time he is hopeful that he can get back home where he lives with his significant other of 20 years [name]) and their 3 dogs and 3 cats. He smiles as he talks about his animals and how spoiled they are.   We further discussed his wishes if he were to get worse instead of better. He initially tells me that he would want CPR but not if this is not felt to help him improve. We discussed the limited benefit of CPR and resuscitation given his declining heart function and limited options for [place] to improve this for him. I explained if he were to require CPR his heart has declined to a point where I would not anticipate it will improve. In this discussion he shares that he would not want  resuscitation efforts given the expectation for poor outcome and prognosis. I explained that this is a DNR - do not resuscitate - and he agrees this is what he wants.   We further discussed HCPOA and surrogate decision maker. He reports that he would want [name] to speak for him as they have had these conversations. He would like to complete HCPOA documentation to reflect this wish in the presence of [name]. I have consulted spiritual care for assistance. I would like to review MOST form but would like to do this with [name]'s support and presence so will follow up tomorrow.   All questions/concerns addressed. Emotional support provided.   Primary Decision Maker PATIENT    SUMMARY OF RECOMMENDATIONS   - DNR decided - Spiritual care consulted to complete [place] to complete MOST prior to d/c  Code Status/Advance Care Planning:  DNR   Symptom Management:   Per PCCM, cardiology  Palliative Prophylaxis:   Bowel Regimen, Delirium Protocol, Frequent Pain Assessment and Turn Reposition  Psycho-social/Spiritual:   Desire for further Chaplaincy support:yes  Additional Recommendations: Caregiving  Support/Resources and Education on  Hospice  Prognosis:   Overall prognosis poor with likely end stage heart disease.   Discharge Planning: To Be Determined      Primary Diagnoses: Present on Admission: . Coronary artery disease of native artery of native heart with stable angina pectoris ([place]) . Type 2 diabetes mellitus with other specified complication ([place]) . COPD (chronic obstructive pulmonary disease) ([place]) . AKI (acute kidney injury) ([place]) . Hypertension . Acute on chronic systolic CHF (congestive heart failure) ([place])   I have reviewed the medical record, interviewed the patient and family, and examined the patient. The following aspects are pertinent.  Past Medical History:  Diagnosis Date  . Arthritis   . Asthma   . Atherosclerosis   . BPH (benign  prostatic hyperplasia)   . Carotid arterial disease ([place])   . Charcot's joint of foot, right   . Chronic combined systolic (congestive) and diastolic (congestive) heart failure ([place])    a. 02/2013 EF 50% by LV gram; b. 04/2017 Echo: EF 25-30%. diff HK. Gr2 DD. Mod dil LA/RV. PASP 75mHg.  .Marland KitchenCOPD (chronic obstructive pulmonary disease) ([place]   . Coronary artery disease    a. 2013 S/P PCI of LAD ([facility];  b. 03/2013 PCI: RCA 90p ( 2.5 x 23 mm DES); c. 02/2014 Cath: patent RCA stent->Med Rx; d. 04/2017 Cath: LM nl, LAd 30ost/p, 618m, D1 80ost, LCX 90p/m, OM1 85, RCA 70/20p, 8071m0d->Referred for CT Surg-felt to be poor candidate.  . Diabetic neuropathy ([facility] . Diabetic ulcer of right foot ([facility] . Hernia   . Hyperlipidemia   . Hypertension   . Ischemic cardiomyopathy    a. 04/2017 Echo: EF 25-30%; b. 08/2017 Cardiac MRI ([facility]): EF 19%, sev glob HK. RVEF 20%, mod BAE, triv MR, mild-mod TR. Basal lateral subendocardial infarct (viable), inf/infsept, dist septal ischemia, basal to mid lat peri-infarct ischemia.  . Morbid obesity ([facility] . Neuropathy   . PAD (peripheral artery disease) ([place]  a. Followed by Dr. DewLucky Cowboy. 08/2010 Periph Angio: RSFA 70-80p (6X60 self-expanding stent); c. 09/2010 Periph Angio: L SFA 100p (7x unknown length self-expanding stent); d. 06/2015 Periph Angio: R SFA short segment occlusion (6x12 self-expanding stent).  . Restless leg syndrome   . Sleep apnea   . Subclavian artery stenosis, right ([facility] [place] . Syncope and collapse   . Tobacco use    a. 75+ yr hx - still smoking 1ppd, down from 2 ppd.  . Type II diabetes mellitus ([facility] . Varicose vein    Social History   Socioeconomic History  . Marital status: Single    Spouse name: Not on file  . Number of children: 0  . Years of education: 12 49 Highest education level: 12th grade  Occupational History  . Occupation: on disability  Tobacco Use  . Smoking status: Current Every Day Smoker    Packs/day: 1.00     Years: 41.00    Pack years: 41.00    Types: Cigarettes  . Smokeless tobacco: Never Used  Vaping Use  . Vaping Use: Never used  Substance and Sexual Activity  . Alcohol use: No  . Drug use: No  . Sexual activity: Yes  Other Topics Concern  . Not on file  Social History Narrative   [place] Independent at baseline.   Social Determinants of Health   Financial Resource Strain: Not on file  Food Insecurity: Not on file  Transportation  Needs: Not on file  Physical Activity: Not on file  Stress: Not on file  Social Connections: Not on file   Family History  Problem Relation Age of Onset  . Heart attack Father   . Heart disease Father   . Hypertension Father   . Hyperlipidemia Father   . Hypertension Mother   . Hyperlipidemia Mother    Scheduled Meds: . apixaban  5 mg Oral BID  . atorvastatin  80 mg Oral Daily  . Chlorhexidine Gluconate Cloth  6 each Topical Daily  . clopidogrel  75 mg Oral Daily  . DULoxetine  30 mg Oral Daily  . ezetimibe  10 mg Oral Daily  . gabapentin  400 mg Oral QID  . insulin aspart  0-20 Units Subcutaneous Q4H  . losartan  12.5 mg Oral Daily  . methylPREDNISolone (SOLU-MEDROL) injection  40 mg Intravenous Daily  . midodrine  5 mg Oral TID WC  . mometasone-formoterol  2 puff Inhalation BID  . multivitamin with minerals  1 tablet Oral Daily  . pantoprazole  40 mg Oral Daily  . sodium chloride flush  3 mL Intravenous Q12H  . tamsulosin  0.4 mg Oral QPC supper  . traZODone  150 mg Oral QHS  . umeclidinium bromide  1 puff Inhalation Daily  . Vericiguat  5 mg Oral Daily   Continuous Infusions: . sodium chloride    . furosemide (LASIX) 200 mg in dextrose 5% 100 mL (2m/mL) infusion 4 mg/hr (06/19/20 0600)  . milrinone 0.25 mcg/kg/min (06/19/20 0600)   PRN Meds:.sodium chloride, acetaminophen, Ipratropium-Albuterol, ipratropium-albuterol, nitroGLYCERIN, ondansetron (ZOFRAN) IV, oxyCODONE, sodium chloride  flush Allergies  Allergen Reactions  . Dulaglutide Anaphylaxis, Diarrhea and Hives  . Other Itching    Skin itching associated with nitro patch  . Prednisone Rash   Review of Systems  Constitutional: Positive for activity change and fatigue. Negative for appetite change.  Respiratory: Negative for shortness of breath.        On oxygen but reports he does not use oxygen at home  Neurological: Positive for weakness.    Physical Exam Vitals and nursing note reviewed.  Constitutional:      General: He is not in acute distress.    Appearance: He is ill-appearing.  Cardiovascular:     Rate and Rhythm: Normal rate.  Pulmonary:     Effort: Pulmonary effort is normal. No tachypnea, accessory muscle usage or respiratory distress.  Abdominal:     General: Abdomen is flat.  Skin:    Comments: R BKA  Neurological:     Mental Status: He is alert and oriented to person, place, and time.     Vital Signs: BP (!) 100/48   Pulse 92   Temp 98.3 F (36.8 C) (Oral)   Resp 12   Ht 5' 7" (1.702 m)   Wt 104.1 kg   SpO2 98%   BMI 35.94 kg/m  Pain Scale: 0-10   Pain Score: Asleep   SpO2: SpO2: 98 % O2 Device:SpO2: 98 % O2 Flow Rate: .O2 Flow Rate (L/min): 2 L/min  IO: Intake/output summary:   Intake/Output Summary (Last 24 hours) at 06/19/2020 0921 Last data filed at 06/19/2020 0811 Gross per 24 hour  Intake 133.34 ml  Output 2000 ml  Net -1866.66 ml    LBM: Last BM Date:  (pt does not remember last BM) Baseline Weight: Weight: 94.8 kg Most recent weight: Weight: 104.1 kg     Palliative Assessment/Data:     Time In: 1010  Time Out: 1050 Time Total: 40 min Greater than 50%  of this time was spent counseling and coordinating care related to the above assessment and plan.  Signed by: Vinie Sill, NP Palliative Medicine Team Pager # [phone] (M-F 8a-5p) Team Phone # [phone] (Nights/Weekends)

## 2020-06-19 NOTE — Progress Notes (Signed)
PT Cancellation Note  Patient Details Name: [name] [name] MRN: [id number] DOB: 04/26/1961   Cancelled Treatment:    Reason Eval/Treat Not Completed: Medical issues which prohibited therapy. Patient required milrinone infusion with current heart rate of 126bpm while in bed. PT will hold on mobility efforts today and follow up when appropriate.   Minna Merritts, PT, MPT  [name] 06/19/2020, 2:00 PM

## 2020-06-19 NOTE — Progress Notes (Signed)
CRITICAL CARE NOTE 60 y.o.malewith a hx of ICM EF 20-25%, CAD/PCI, tobacco abuse, COPD, PAD s/p L SFA stenting, s/p R BKA, afib on Eliquis, OSA noncompliant with CPAP, hx of leaving AMA and noncompliancewho is being seen today for the evaluation ofheart failure. presents to the emergency room with shortness of breath, fluid retention in his legs and intermittent chest pain. He uses albuterol which helped a bit but continues to have dyspnea with exertion. He denies cough, fever or chills  Cardiology Consulted recommend ICU admission for inotrope Nephrology consulted for progressive cardiorenal syndrome-placed on lasix infusion   EVENTS/Hospital Course 1/9 admission to ICU for severe heart failure 1/9 CVL placed 1/10 on milrinone infusion/lasic infusion     CC  follow up respiratory failure  SUBJECTIVE Patient remains  ill Prognosis is guarded Poor prognosis Very poor vasculopath  BMP Latest Ref Rng & Units 06/19/2020 06/18/2020 06/17/2020  Glucose 70 - 99 mg/dL 134(H) 134(H) 124(H)  BUN 6 - 20 mg/dL 48(H) 40(H) 34(H)  Creatinine 0.61 - 1.24 mg/dL 1.57(H) 1.58(H) 1.50(H)  BUN/Creat Ratio 9 - 20 - - -  Sodium 135 - 145 mmol/L 127(L) 125(L) 125(L)  Potassium 3.5 - 5.1 mmol/L 5.0 5.5(H) 5.2(H)  Chloride 98 - 111 mmol/L 92(L) 92(L) 90(L)  CO2 22 - 32 mmol/L 26 24 25   Calcium 8.9 - 10.3 mg/dL 9.1 9.2 9.1     BP (!) 100/48   Pulse 92   Temp 98.3 F (36.8 C) (Oral)   Resp 12   Ht 5\' 7"  (1.702 m)   Wt 104.1 kg   SpO2 98%   BMI 35.94 kg/m    I/O last 3 completed shifts: In: 133.3 [I.V.:133.3] Out: 1700 [Urine:1700] No intake/output data recorded.  SpO2: 98 % O2 Flow Rate (L/min): 2 L/min  Estimated body mass index is 35.94 kg/m as calculated from the following:   Height as of this encounter: 5\' 7"  (1.702 m).   Weight as of this encounter: 104.1 kg.   Review of Systems:  Gen:  Denies  fever, sweats, chills weight loss  HEENT: Denies blurred vision, double  vision, ear pain, eye pain, hearing loss, nose bleeds, sore throat Cardiac:  No dizziness, chest pain or heaviness, chest tightness,edema, No JVD Resp:  +SOB Other:  All other systems negative    PHYSICAL EXAMINATION:  GENERAL:critically ill appearing,  HEAD: Normocephalic, atraumatic.  EYES: Pupils equal, round, reactive to light.  No scleral icterus.  MOUTH: Moist mucosal membrane. NECK: Supple.  PULMONARY: +rhonchi,  CARDIOVASCULAR: S1 and S2. Regular rate and rhythm. No murmurs, rubs, or gallops.  GASTROINTESTINAL: Soft, nontender, -distended.  Positive bowel sounds.   MUSCULOSKELETAL: +edema.  NEUROLOGIC:alert  SKIN:iRT bka left leg,  cold and black toes RT foot  MEDICATIONS: I have reviewed all medications and confirmed regimen as documented   CULTURE RESULTS   Recent Results (from the past 240 hour(s))  Resp Panel by RT-PCR (Flu A&B, Covid)     Status: None   Collection Time: 06/17/20  6:00 AM  Result Value Ref Range Status   SARS Coronavirus 2 by RT PCR NEGATIVE NEGATIVE Final    Comment: (NOTE) SARS-CoV-2 target nucleic acids are NOT DETECTED.  The SARS-CoV-2 RNA is generally detectable in upper respiratory specimens during the acute phase of infection. The lowest concentration of SARS-CoV-2 viral copies this assay can detect is 138 copies/mL. A negative result does not preclude SARS-Cov-2 infection and should not be used as the sole basis for treatment or other patient management  decisions. A negative result may occur with  improper specimen collection/handling, submission of specimen other than nasopharyngeal swab, presence of viral mutation(s) within the areas targeted by this assay, and inadequate number of viral copies(<138 copies/mL). A negative result must be combined with clinical observations, patient history, and epidemiological information. The expected result is Negative.  Fact Sheet for Patients:  EntrepreneurPulse.com.au  Fact  Sheet for Healthcare Providers:  IncredibleEmployment.be  This test is no t yet approved or cleared by the [place] FDA and  has been authorized for detection and/or diagnosis of SARS-CoV-2 by FDA under an Emergency Use Authorization (EUA). This EUA will remain  in effect (meaning this test can be used) for the duration of the COVID-19 declaration under Section 564(b)(1) of the Act, 21 U.S.C.section 360bbb-3(b)(1), unless the authorization is terminated  or revoked sooner.       Influenza A by PCR NEGATIVE NEGATIVE Final   Influenza B by PCR NEGATIVE NEGATIVE Final    Comment: (NOTE) The Xpert Xpress SARS-CoV-2/FLU/RSV plus assay is intended as an aid in the diagnosis of influenza from Nasopharyngeal swab specimens and should not be used as a sole basis for treatment. Nasal washings and aspirates are unacceptable for Xpert Xpress SARS-CoV-2/FLU/RSV testing.  Fact Sheet for Patients: EntrepreneurPulse.com.au  Fact Sheet for Healthcare Providers: IncredibleEmployment.be  This test is not yet approved or cleared by the [place] FDA and has been authorized for detection and/or diagnosis of SARS-CoV-2 by FDA under an Emergency Use Authorization (EUA). This EUA will remain in effect (meaning this test can be used) for the duration of the COVID-19 declaration under Section 564(b)(1) of the Act, 21 U.S.C. section 360bbb-3(b)(1), unless the authorization is terminated or revoked.  Performed at [facility], [address] [place], [place] [address]           IMAGING    DG Chest Port 1 View  Result Date: 06/18/2020 CLINICAL DATA:  Shortness of breath.  COPD.  Central line placement. EXAM: PORTABLE CHEST 1 VIEW COMPARISON:  06/17/2020 FINDINGS: New left jugular central venous catheter is seen with tip overlying the distal left brachiocephalic vein, near the midline. No evidence of pneumothorax. Cardiomegaly  remains stable. Diffuse pulmonary vascular congestion is unchanged. No evidence of pulmonary consolidation or pleural effusion. IMPRESSION: New left jugular central venous catheter tip overlies the distal left brachiocephalic vein. No evidence of pneumothorax. Stable cardiomegaly and diffuse pulmonary vascular congestion. Electronically Signed   By: Marlaine Hind M.D.   On: 06/18/2020 14:17     Nutrition Status:           Indwelling Urinary Catheter continued, requirement due to   Reason to continue Indwelling Urinary Catheter strict Intake/Output monitoring for hemodynamic instability   Central Line/ continued, requirement due to  Reason to continue [place] of central venous pressure or other hemodynamic parameters and poor IV access      ASSESSMENT AND PLAN SYNOPSIS    60 yo white male with multiple medical issues with severe and acute systolic CHF exacerbation with shock with progressive cardiorenal syndrome  ACUTE SYSTOLIC CARDIAC FAILURE- EF <20% admitted to ICU for placement of CVL and milrinone infusion Prognosis is extremely poor -follow up cardiac enzymes as indicated -follow up cardiology recs   Morbid obesity, possible OSA.   Will certainly impact respiratory mechanics   ACUTE KIDNEY INJURY/Renal Failure -continue Foley Catheter-assess need -Avoid nephrotoxic agents -Follow urine output, BMP -Ensure adequate renal perfusion, optimize oxygenation -Renal dose medications   SHOCK-CARDIOGENIC -  use vasopressors to keep MAP>65 -follow Mixed venous ABG and LA  CARDIAC ICU monitoring   GI GI PROPHYLAXIS as indicated  NUTRITIONAL STATUS Nutrition Status:         DIET--> as tolerated Constipation protocol as indicated  ENDO - will use ICU hypoglycemic\Hyperglycemia protocol if indicated     ELECTROLYTES -follow labs as needed -replace as needed -pharmacy consultation and following   DVT/GI PRX ordered and  assessed TRANSFUSIONS AS NEEDED MONITOR FSBS I Assessed the need for Labs I Assessed the need for Foley I Assessed the need for Central Venous Line Family Discussion when available I Assessed the need for Mobilization I made an Assessment of medications to be adjusted accordingly Safety Risk assessment completed   CASE DISCUSSED IN MULTIDISCIPLINARY ROUNDS WITH ICU TEAM  Critical Care Time devoted to patient care services described in this note is 34 minutes.   Overall, patient is critically ill, prognosis is guarded.  Patient with Multiorgan failure and at high risk for cardiac arrest and death.    Corrin Parker, M.D.  [facility]  Medical Director [place] Director [facility] Cardio-Pulmonary Department

## 2020-06-19 NOTE — Progress Notes (Signed)
[place] visited twice w/pt. this PM per referral from [facility] for follow-up re: AD; pt. says he has AD at home but it is not filled out; he has copy of document in room and will call for chaplains if he has any questions. Later in afternoon, RN shared pt.'s S.O. was asking about notarization for document; upon conversation w/pt. and S.O. it appears document has now been completed and is ready for notarization; [place] suggested pt. page chaplains tomorrow AM when notary comes back on shift --> pt. will call tomorrow AM.  CH remains available as needed.

## 2020-06-19 NOTE — Progress Notes (Signed)
Progress Note  Patient Name: [name] [name] Date of Encounter: 06/19/2020  [facility] Cardiologist: Ida Rogue, MD   Subjective   Arousable but goes back to sleep On milrinone infusion, Lasix drip at 4 2 L negative Telemetry reviewed, no significant arrhythmia apart from permanent atrial fibrillation  Inpatient Medications    Scheduled Meds: . apixaban  5 mg Oral BID  . atorvastatin  80 mg Oral Daily  . Chlorhexidine Gluconate Cloth  6 each Topical Daily  . clopidogrel  75 mg Oral Daily  . DULoxetine  30 mg Oral Daily  . ezetimibe  10 mg Oral Daily  . gabapentin  400 mg Oral QID  . insulin aspart  0-20 Units Subcutaneous Q4H  . losartan  12.5 mg Oral Daily  . methylPREDNISolone (SOLU-MEDROL) injection  40 mg Intravenous Daily  . midodrine  5 mg Oral TID WC  . mometasone-formoterol  2 puff Inhalation BID  . multivitamin with minerals  1 tablet Oral Daily  . pantoprazole  40 mg Oral Daily  . sodium chloride flush  3 mL Intravenous Q12H  . tamsulosin  0.4 mg Oral QPC supper  . traZODone  150 mg Oral QHS  . umeclidinium bromide  1 puff Inhalation Daily  . Vericiguat  5 mg Oral Daily   Continuous Infusions: . sodium chloride    . furosemide (LASIX) 200 mg in dextrose 5% 100 mL (2mg /mL) infusion 4 mg/hr (06/19/20 0600)  . milrinone 0.25 mcg/kg/min (06/19/20 0600)   PRN Meds: sodium chloride, acetaminophen, Ipratropium-Albuterol, ipratropium-albuterol, nitroGLYCERIN, ondansetron (ZOFRAN) IV, oxyCODONE, sodium chloride flush   Vital Signs    Vitals:   06/19/20 0400 06/19/20 0414 06/19/20 0500 06/19/20 0600  BP: 118/86  (!) 119/103 (!) 100/48  Pulse: (!) 113  94 92  Resp: 19  12 12   Temp:  98.3 F (36.8 C)    TempSrc:  Oral    SpO2: 96%  97% 98%  Weight:   104.1 kg   Height:        Intake/Output Summary (Last 24 hours) at 06/19/2020 0914 Last data filed at 06/19/2020 0811 Gross per 24 hour  Intake 133.34 ml  Output 2000 ml  Net -1866.66 ml   Last 3  Weights 06/19/2020 06/17/2020 05/30/2020  Weight (lbs) 229 lb 8 oz 209 lb 213 lb  Weight (kg) 104.1 kg 94.802 kg 96.616 kg  Some encounter information is confidential and restricted. Go to Review Flowsheets activity to see all data.      Telemetry    Atrial fibrillation- Personally Reviewed  ECG     - Personally Reviewed  Physical Exam   GEN: No acute distress.   Neck:  JVD 10+ Cardiac:  Irregularly irregular, no murmurs, rubs, or gallops.  Respiratory:  Scattered Rales, dullness at the bases GI: Soft, nontender, non-distended  MS:  1+ bilateral pitting lower extremity edema; No deformity. Amputation right lower extremity at the knee Neuro:  Nonfocal , full exam not performed Psych: Normal affect , very sleepy  Labs    High Sensitivity Troponin:   Recent Labs  Lab 06/17/20 0049 06/17/20 0420  TROPONINIHS 39* 54*      Chemistry Recent Labs  Lab 06/17/20 0130 06/18/20 0412 06/19/20 0347  NA 125* 125* 127*  K 5.2* 5.5* 5.0  CL 90* 92* 92*  CO2 25 24 26   GLUCOSE 124* 134* 134*  BUN 34* 40* 48*  CREATININE 1.50* 1.58* 1.57*  CALCIUM 9.1 9.2 9.1  PROT 7.0  --   --  ALBUMIN 3.8  --   --   AST 81*  --   --   ALT 37  --   --   ALKPHOS 114  --   --   BILITOT 3.7*  --   --   GFRNONAA 54* 51* 51*  ANIONGAP 10 9 9      Hematology Recent Labs  Lab 06/17/20 0049 06/19/20 0347  WBC 8.3 7.1  RBC 4.88 4.17*  HGB 13.1 11.3*  HCT 40.3 33.2*  MCV 82.6 79.6*  MCH 26.8 27.1  MCHC 32.5 34.0  RDW 21.9* 21.5*  PLT 184 131*    BNP Recent Labs  Lab 06/12/20 1327 06/14/20 1413 06/17/20 0049  BNP 1,985.3* 2,062.1* 1,925.5*     DDimer No results for input(s): DDIMER in the last 168 hours.   Radiology    DG Chest Port 1 View  Result Date: 06/18/2020 CLINICAL DATA:  Shortness of breath.  COPD.  Central line placement. EXAM: PORTABLE CHEST 1 VIEW COMPARISON:  06/17/2020 FINDINGS: New left jugular central venous catheter is seen with tip overlying the distal left  brachiocephalic vein, near the midline. No evidence of pneumothorax. Cardiomegaly remains stable. Diffuse pulmonary vascular congestion is unchanged. No evidence of pulmonary consolidation or pleural effusion. IMPRESSION: New left jugular central venous catheter tip overlies the distal left brachiocephalic vein. No evidence of pneumothorax. Stable cardiomegaly and diffuse pulmonary vascular congestion. Electronically Signed   By: Marlaine Hind M.D.   On: 06/18/2020 14:17   ECHOCARDIOGRAM COMPLETE  Result Date: 06/17/2020    ECHOCARDIOGRAM REPORT   Patient Name:   [name] [name] Date of Exam: 06/17/2020 Medical Rec #:  [id number]      Height:       67.0 in Accession #:    [id number]     Weight:       209.0 lb Date of Birth:  1960-09-08       BSA:          2.060 m Patient Age:    60 years       BP:           82/70 mmHg Patient Gender: M              HR:           95 bpm. Exam Location:  [facility] Procedure: 2D Echo Indications:     CHF-Acute Systolic M35.36  History:         Patient has prior history of Echocardiogram examinations, most                  recent 01/20/2020. CAD, COPD; Risk Factors:Hypertension,                  Diabetes and Dyslipidemia.  Sonographer:     Avanell Shackleton Referring Phys:  [id number] Athena Masse Diagnosing Phys: Ida Rogue MD IMPRESSIONS  1. Left ventricular ejection fraction, by estimation, is <20%. Left ventricular ejection fraction by PLAX is 14 %. The left ventricle has severely decreased function. The left ventricle demonstrates global hypokinesis. The left ventricular internal cavity size was severely dilated. Left ventricular diastolic parameters are indeterminate.  2. Right ventricular systolic function is severely reduced. The right ventricular size is moderately enlarged. There is severely elevated pulmonary artery systolic pressure. The estimated right ventricular systolic pressure is 00.8 mmHg.  3. Left atrial size was severely dilated.  4. Right atrial size was severely dilated.   5. Tricuspid valve regurgitation is mild to moderate.  6.  The inferior vena cava is dilated in size with <50% respiratory variability, suggesting right atrial pressure of 15 mmHg. FINDINGS  Left Ventricle: Left ventricular ejection fraction, by estimation, is <20%. Left ventricular ejection fraction by PLAX is 14 %. The left ventricle has severely decreased function. The left ventricle demonstrates global hypokinesis. The left ventricular internal cavity size was severely dilated. There is no left ventricular hypertrophy. Left ventricular diastolic parameters are indeterminate. Right Ventricle: The right ventricular size is moderately enlarged. No increase in right ventricular wall thickness. Right ventricular systolic function is severely reduced. There is severely elevated pulmonary artery systolic pressure. The tricuspid regurgitant velocity is 2.88 m/s, and with an assumed right atrial pressure of 20 mmHg, the estimated right ventricular systolic pressure is 57.3 mmHg. Left Atrium: Left atrial size was severely dilated. Right Atrium: Right atrial size was severely dilated. Pericardium: There is no evidence of pericardial effusion. Mitral Valve: The mitral valve is normal in structure. Mild mitral valve regurgitation. No evidence of mitral valve stenosis. Tricuspid Valve: The tricuspid valve is normal in structure. Tricuspid valve regurgitation is mild to moderate. No evidence of tricuspid stenosis. Aortic Valve: The aortic valve was not well visualized. Aortic valve regurgitation is not visualized. No aortic stenosis is present. Aortic valve mean gradient measures 4.0 mmHg. Aortic valve peak gradient measures 6.7 mmHg. Aortic valve area, by VTI measures 1.02 cm. Pulmonic Valve: The pulmonic valve was normal in structure. Pulmonic valve regurgitation is mild. No evidence of pulmonic stenosis. Aorta: The aortic root is normal in size and structure. Venous: The inferior vena cava is dilated in size with less than  50% respiratory variability, suggesting right atrial pressure of 15 mmHg. IAS/Shunts: No atrial level shunt detected by color flow Doppler.  LEFT VENTRICLE PLAX 2D LV EF:         Left ventricular ejection fraction by PLAX is 14 %. LVIDd:         6.64 cm LVIDs:         6.20 cm LV PW:         1.09 cm LV IVS:        1.22 cm LVOT diam:     2.10 cm LV SV:         18 LV SV Index:   9 LVOT Area:     3.46 cm  LV Volumes (MOD) LV vol d, MOD A2C: 299.0 ml LV vol d, MOD A4C: 284.0 ml LV vol s, MOD A2C: 258.0 ml LV vol s, MOD A4C: 243.0 ml LV SV MOD A2C:     41.0 ml LV SV MOD A4C:     284.0 ml LV SV MOD BP:      40.8 ml IVC IVC diam: 2.73 cm LEFT ATRIUM             Index       RIGHT ATRIUM           Index LA diam:        5.80 cm 2.81 cm/m  RA Area:     31.80 cm LA Vol (A2C):   78.7 ml 38.20 ml/m RA Volume:   130.00 ml 63.09 ml/m LA Vol (A4C):   75.4 ml 36.59 ml/m LA Biplane Vol: 77.1 ml 37.42 ml/m  AORTIC VALVE AV Area (Vmax):    1.07 cm AV Area (Vmean):   1.02 cm AV Area (VTI):     1.02 cm AV Vmax:           129.67 cm/s AV  Vmean:          93.300 cm/s AV VTI:            0.181 m AV Peak Grad:      6.7 mmHg AV Mean Grad:      4.0 mmHg LVOT Vmax:         40.20 cm/s LVOT Vmean:        27.600 cm/s LVOT VTI:          0.053 m LVOT/AV VTI ratio: 0.29  AORTA Ao Root diam: 3.20 cm MR Peak grad: 92.2 mmHg   TRICUSPID VALVE MR Mean grad: 51.0 mmHg   TR Peak grad:   33.2 mmHg MR Vmax:      480.00 cm/s TR Vmax:        288.00 cm/s MR Vmean:     329.0 cm/s                           SHUNTS                           Systemic VTI:  0.05 m                           Systemic Diam: 2.10 cm Ida Rogue MD Electronically signed by Ida Rogue MD Signature Date/Time: 06/17/2020/7:04:59 PM    Final     Cardiac Studies  Echocardiogram June 17, 2020 1. Left ventricular ejection fraction, by estimation, is <20%. Left  ventricular ejection fraction by PLAX is 14 %. The left ventricle has  severely decreased function. The left  ventricle demonstrates global  hypokinesis. The left ventricular internal  cavity size was severely dilated. Left ventricular diastolic parameters  are indeterminate.  2. Right ventricular systolic function is severely reduced. The right  ventricular size is moderately enlarged. There is severely elevated  pulmonary artery systolic pressure. The estimated right ventricular  systolic pressure is 51.8 mmHg.  3. Left atrial size was severely dilated.  4. Right atrial size was severely dilated.  5. Tricuspid valve regurgitation is mild to moderate.  6. The inferior vena cava is dilated in size with <50% respiratory  variability, suggesting right atrial pressure of 15 mmHg.    Patient Profile     [name] [name] is a 60 y.o. 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% As per outside notes, no options for intervention of his severe coronary disease, not CABG candidate not percutaneous stenting candidate -Long history noncompliance, leaving hospital AMA, leaving the emergency room AMA Followed recently by CHF clinic in [place], they have been doing their best to keep him euvolemic with IV Lasix 2-3 times per week with aggressive torsemide and recent suggested to start metolazone with his torsemide -Given the above he had started meetings with palliative as outpatient -He presented to the emergency room with worsening symptoms Would continue:   milrinone infusion at  current dose -Recommend we increase Lasix infusion up to 8 mg/h Renal function stable -Metoprolol on hold as he is on milrinone -Imdur held for low pressures -We will try to start low-dose losartan 12.5 daily -Outlook is poor, again would meet with palliative as inpatient  AKI  Stable renal function, likely component of cardiorenal syndrome -We will increase Lasix  Chronic hyponatremia - suspect 2/2 to volume overload Milrinone Lasix as above Massive fluid overload on echocardiogram  Elevated troponin with known CAD with prior PCI  - patient declined CABG in the past at [facility] - He had a cath in August showing no great PCI targets btu considered atherectomy of Lcx however patient eventually left AMA -No plan for ischemic work-up at this time  Persistent Afib - hx of noncompliance on Eliquis -Would continue Eliquis here, unclear if he has been taking this as outpatient, Not a candidate for attempt to restore normal sinus rhythm given massive cardiac stretch  HLD - continue home atorvastatin 80mg  daily  HTN - midodrine 5mg  TID Metoprolol held on milrinone  DM2 - per IM  Very complicated patient, discussed with nursing  Total encounter time more than 35 minutes  Greater than 50% was spent in counseling and coordination of care with the patient   For questions or updates, please contact [facility] Please consult [email or web address] for contact info under        Signed, Ida Rogue, MD  06/19/2020, 9:14 AM

## 2020-06-20 DIAGNOSIS — I5023 Acute on chronic systolic (congestive) heart failure: Secondary | ICD-10-CM | POA: Diagnosis not present

## 2020-06-20 DIAGNOSIS — Z7189 Other specified counseling: Secondary | ICD-10-CM | POA: Diagnosis not present

## 2020-06-20 DIAGNOSIS — Z515 Encounter for palliative care: Secondary | ICD-10-CM | POA: Diagnosis not present

## 2020-06-20 LAB — BASIC METABOLIC PANEL
Anion gap: 11 (ref 5–15)
BUN: 49 mg/dL — ABNORMAL HIGH (ref 6–20)
CO2: 29 mmol/L (ref 22–32)
Calcium: 9 mg/dL (ref 8.9–10.3)
Chloride: 89 mmol/L — ABNORMAL LOW (ref 98–111)
Creatinine, Ser: 1.47 mg/dL — ABNORMAL HIGH (ref 0.61–1.24)
GFR, Estimated: 55 mL/min — ABNORMAL LOW (ref >60)
Glucose, Bld: 124 mg/dL — ABNORMAL HIGH (ref 70–99)
Potassium: 4.2 mmol/L (ref 3.5–5.1)
Sodium: 129 mmol/L — ABNORMAL LOW (ref 135–145)

## 2020-06-20 LAB — GLUCOSE, CAPILLARY
Glucose-Capillary: 128 mg/dL — ABNORMAL HIGH (ref 70–99)
Glucose-Capillary: 129 mg/dL — ABNORMAL HIGH (ref 70–99)
Glucose-Capillary: 138 mg/dL — ABNORMAL HIGH (ref 70–99)
Glucose-Capillary: 160 mg/dL — ABNORMAL HIGH (ref 70–99)
Glucose-Capillary: 174 mg/dL — ABNORMAL HIGH (ref 70–99)
Glucose-Capillary: 223 mg/dL — ABNORMAL HIGH (ref 70–99)

## 2020-06-20 LAB — COOXEMETRY PANEL
Carboxyhemoglobin: 2 % — ABNORMAL HIGH (ref 0.5–1.5)
Methemoglobin: 0.4 % (ref 0.0–1.5)
O2 Saturation: 79.9 %

## 2020-06-20 MED ORDER — MILRINONE LACTATE IN DEXTROSE 20-5 MG/100ML-% IV SOLN
0.1250 ug/kg/min | INTRAVENOUS | Status: DC
  Administered 2020-06-20 – 2020-06-25 (×3): 0.125 ug/kg/min via INTRAVENOUS
  Filled 2020-06-20 (×5): qty 100

## 2020-06-20 MED ORDER — SPIRONOLACTONE 25 MG PO TABS
12.5000 mg | ORAL_TABLET | Freq: Every day | ORAL | Status: DC
  Administered 2020-06-20 – 2020-06-27 (×8): 12.5 mg via ORAL
  Filled 2020-06-20: qty 0.5
  Filled 2020-06-20 (×3): qty 1
  Filled 2020-06-20: qty 0.5
  Filled 2020-06-20 (×2): qty 1
  Filled 2020-06-20 (×3): qty 0.5
  Filled 2020-06-20: qty 1
  Filled 2020-06-20: qty 0.5
  Filled 2020-06-20: qty 1
  Filled 2020-06-20 (×2): qty 0.5
  Filled 2020-06-20: qty 1

## 2020-06-20 NOTE — Progress Notes (Signed)
CRITICAL CARE NOTE  60 y.o.malewith a hx of ICM EF 20-25%, CAD/PCI, tobacco abuse, COPD, PAD s/p L SFA stenting, s/p R BKA, afib on Eliquis, OSA noncompliant with CPAP, hx of leaving AMA and noncompliancewho is being seen today for the evaluation ofheart failure.presents to the emergency room with shortness of breath, fluid retention in his legs and intermittent chest pain. He uses albuterol which helped a bit but continues to have dyspnea with exertion. He denies cough, fever or chills  Cardiology Consulted recommend ICU admission for inotrope Nephrology consulted for progressive cardiorenal syndrome-placed on lasix infusion   EVENTS/Hospital Course 1/9 admission to ICU for severe heart failure 1/9 CVL placed 1/10 on milrinone infusion/lasic infusion 1/11 severe end stage CHF, prognosis is poor    CC  follow up respiratory failure  SUBJECTIVE Patient remains critically ill Prognosis is guarded   BP 102/64   Pulse 77   Temp 98.3 F (36.8 C)   Resp 16   Ht 5\' 7"  (1.702 m)   Wt 102.7 kg   SpO2 98%   BMI 35.46 kg/m    I/O last 3 completed shifts: In: 364.8 [I.V.:364.8] Out: 3775 [Urine:3775] No intake/output data recorded.  SpO2: 98 % O2 Flow Rate (L/min): 2 L/min  Estimated body mass index is 35.46 kg/m as calculated from the following:   Height as of this encounter: 5\' 7"  (1.702 m).   Weight as of this encounter: 102.7 kg.  SIGNIFICANT EVENTS   REVIEW OF SYSTEMS  PATIENT IS UNABLE TO PROVIDE COMPLETE REVIEW OF SYSTEMS DUE TO SEVERE CRITICAL ILLNESS        PHYSICAL EXAMINATION:  GENERAL:critically ill appearing, +resp distress HEAD: Normocephalic, atraumatic.  EYES: Pupils equal, round, reactive to light.  No scleral icterus.  MOUTH: Moist mucosal membrane. NECK: Supple.  PULMONARY: +rhonchi, +wheezing CARDIOVASCULAR: S1 and S2. Regular rate and rhythm. No murmurs, rubs, or gallops.  GASTROINTESTINAL: Soft, nontender, -distended.  Positive  bowel sounds.   MUSCULOSKELETAL: + edema. LEFT FOOT black toes and ischemic NEUROLOGIC: alert and awake SKIN:intact,warm,dry  MEDICATIONS: I have reviewed all medications and confirmed regimen as documented   CULTURE RESULTS   Recent Results (from the past 240 hour(s))  Resp Panel by RT-PCR (Flu A&B, Covid)     Status: None   Collection Time: 06/17/20  6:00 AM  Result Value Ref Range Status   SARS Coronavirus 2 by RT PCR NEGATIVE NEGATIVE Final    Comment: (NOTE) SARS-CoV-2 target nucleic acids are NOT DETECTED.  The SARS-CoV-2 RNA is generally detectable in upper respiratory specimens during the acute phase of infection. The lowest concentration of SARS-CoV-2 viral copies this assay can detect is 138 copies/mL. A negative result does not preclude SARS-Cov-2 infection and should not be used as the sole basis for treatment or other patient management decisions. A negative result may occur with  improper specimen collection/handling, submission of specimen other than nasopharyngeal swab, presence of viral mutation(s) within the areas targeted by this assay, and inadequate number of viral copies(<138 copies/mL). A negative result must be combined with clinical observations, patient history, and epidemiological information. The expected result is Negative.  Fact Sheet for Patients:  EntrepreneurPulse.com.au  Fact Sheet for Healthcare Providers:  IncredibleEmployment.be  This test is no t yet approved or cleared by the [place] FDA and  has been authorized for detection and/or diagnosis of SARS-CoV-2 by FDA under an Emergency Use Authorization (EUA). This EUA will remain  in effect (meaning this test can be used) for the duration  of the COVID-19 declaration under Section 564(b)(1) of the Act, 21 U.S.C.section 360bbb-3(b)(1), unless the authorization is terminated  or revoked sooner.       Influenza A by PCR NEGATIVE NEGATIVE Final    Influenza B by PCR NEGATIVE NEGATIVE Final    Comment: (NOTE) The Xpert Xpress SARS-CoV-2/FLU/RSV plus assay is intended as an aid in the diagnosis of influenza from Nasopharyngeal swab specimens and should not be used as a sole basis for treatment. Nasal washings and aspirates are unacceptable for Xpert Xpress SARS-CoV-2/FLU/RSV testing.  Fact Sheet for Patients: EntrepreneurPulse.com.au  Fact Sheet for Healthcare Providers: IncredibleEmployment.be  This test is not yet approved or cleared by the [place] FDA and has been authorized for detection and/or diagnosis of SARS-CoV-2 by FDA under an Emergency Use Authorization (EUA). This EUA will remain in effect (meaning this test can be used) for the duration of the COVID-19 declaration under Section 564(b)(1) of the Act, 21 U.S.C. section 360bbb-3(b)(1), unless the authorization is terminated or revoked.  Performed at [facility], [address], [place], [place] [address]          Indwelling Urinary Catheter continued, requirement due to   Reason to continue Indwelling Urinary Catheter strict Intake/Output monitoring for hemodynamic instability   Central Line/ continued, requirement due to  Reason to continue [place] of central venous pressure or other hemodynamic parameters and poor IV access     ASSESSMENT AND PLAN SYNOPSIS   60 yo white male with multiple medical issues with severe and acute systolic CHF exacerbation with shock with progressive cardiorenal syndrome  ACUTE SYSTOLIC CARDIAC FAILURE- EF<20% admitted to ICU for placement of CVL and milrinone infusion Prognosis is extremely poor -follow up cardiac enzymes as indicated -follow up cardiology recs  Morbid obesity, possible OSA.   Will certainly impact respiratory mechanics  ACUTE KIDNEY INJURY/Renal Failure -continue Foley Catheter-assess need -Avoid nephrotoxic agents -Follow  urine output, BMP -Ensure adequate renal perfusion, optimize oxygenation -Renal dose medications    SHOCK-CARDIOGENIC -use vasopressors to keep MAP>65 -follow ABG and LA  CARDIAC ICU monitoring   DIET--> as tolerated Constipation protocol as indicated  ENDO - will use ICU hypoglycemic\Hyperglycemia protocol if indicated     ELECTROLYTES -follow labs as needed -replace as needed -pharmacy consultation and following   DVT/GI PRX ordered and assessed TRANSFUSIONS AS NEEDED MONITOR FSBS I Assessed the need for Labs I Assessed the need for Foley I Assessed the need for Central Venous Line Family Discussion when available I Assessed the need for Mobilization I made an Assessment of medications to be adjusted accordingly Safety Risk assessment completed   CASE DISCUSSED IN MULTIDISCIPLINARY ROUNDS WITH ICU TEAM  Critical Care Time devoted to patient care services described in this note is 35 minutes.   Overall, patient is critically ill, prognosis is guarded.    Recommend Palliative care for assessment for Hospice   Sindia Kowalczyk Patricia Pesa, M.D.  [facility]  Medical Director [place] Director [facility] Cardio-Pulmonary Department

## 2020-06-20 NOTE — Progress Notes (Signed)
Palliative:  HPI: 60 y.o. 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 Outpatient palliative care visit noted from 05/29/20 with plans for consideration of DNR status and reviewing MOST form and HCPOA but not yet completed.   I returned to follow up with [facility]. He is lying in bed. Daughter, Rosary Lively, is at bedside and teasing him. She had good questions regarding his condition and treatment plan. I answered her questions and explained medications and the difficulty treating advanced heart failure with monitoring of kidney function and frequent changes in medication regimen. [name] [name] has been recently working with spiritual care services to complete HCPOA. Cem appears in good spirits today and reports he is feeling a little better. I did not go into further goals of care discussions at this time.   All questions/concerns addressed. Emotional support provided.   Exam: Alert, oriented. Good spirits. No distress. HR 100-110s. Breathing regular, unlabored on 2L nasal cannula.   Plan: - DNR decided yesterday.  - HCPOA in works of being completed.  - Hopeful to catch HCPOA at bedside to work with [facility] to complete MOST form.  - He follows with outpatient palliative care already.   15 min  Vinie Sill, NP Palliative Medicine Team Pager [phone] (Please see [email or web address] for schedule) Team Phone [phone]    Greater than 50%  of this time was spent counseling and coordinating care related to the above assessment and plan

## 2020-06-20 NOTE — Progress Notes (Signed)
[address] [place], [place] [address] Phone [phone]. Fax [phone]  Date: 06/20/2020             Hospital Course: Patient is a 60 y.o. male  admitted to [facility] on 06/17/2020 for evaluation of Bilirubinemia [E80.6] CHF (congestive heart failure) ([place]) [I50.9] Hyponatremia [E87.1] Tobacco abuse [Z72.0] Acute pulmonary edema (HCC) [J81.0] SOB (shortness of breath) [R06.02] CHF exacerbation (HCC) [I50.9] Troponin I above reference range [R77.8] AKI (acute kidney injury) ([place]) [N17.9] Chest pain [R07.9] Acute on chronic heart failure, unspecified heart failure type ([place]) [I50.9]  Patient has significant coronary artery disease, with history of LAD, RCA stent, last cath on August 2021 showing multivessel disease, chronic systolic CHF with EF less than 20%(01/20/2020), diabetes with neuropathy, peripheral arterial disease status post right BKA, atrial fibrillation on Eliquis, COPD, tobacco abuse. Presented to the emergency room with shortness of breath, lower extremity edema Baseline creatinine of 1.10, presenting creatinine of 1.50 on January 8. Also noted to have low sodium of 125  1/9- lasix drip started and patient moved to icu for ionotropes    Today, Patient is doing fair States he is able to eat without nausea or vomiting Continued on lasix drip and milrinone with good UOP    01/10 0701 - 01/11 0700 In: 255.8 [I.V.:255.8] Out: 3075 [Urine:3075]    Vital Signs: Blood pressure 102/64, pulse 77, temperature 98.3 F (36.8 C), resp. rate 16, height 5\' 7"  (1.702 m), weight 102.7 kg, SpO2 98 %.   Intake/Output Summary (Last 24 hours) at 06/20/2020 0901 Last data filed at 06/20/2020 0700 Gross per 24 hour  Intake 255.77 ml  Output 2375 ml  Net -2119.23 ml    Weight trends: Filed Weights   06/17/20 0036 06/19/20 0500 06/20/20 0500  Weight: 94.8 kg 104.1 kg 102.7 kg    Physical Exam: General:  Laying in the bed, no acute distress  HEENT  moist oral  mucous membranes, anicteric  Neck:  Supple, no masses  Lungs:  Mild basilar crackles, [place] O2  Heart::  Irregular, tachycardic, 2/6 systolic murmur  Abdomen:  Distended, ascites  Extremities:  Tight lower extremity edema in the left up to abdomen, right BKA  Neurologic:  Alert and oriented  Skin:  Congestive changes in the left leg    Lab results: Basic Metabolic Panel: Recent Labs  Lab 06/18/20 0412 06/19/20 0347 06/20/20 0539  NA 125* 127* 129*  K 5.5* 5.0 4.2  CL 92* 92* 89*  CO2 24 26 29   GLUCOSE 134* 134* 124*  BUN 40* 48* 49*  CREATININE 1.58* 1.57* 1.47*  CALCIUM 9.2 9.1 9.0  MG  --  2.2  --   PHOS  --  3.9  --     Liver Function Tests: Recent Labs  Lab 06/17/20 0130  AST 81*  ALT 37  ALKPHOS 114  BILITOT 3.7*  PROT 7.0  ALBUMIN 3.8   No results for input(s): LIPASE, AMYLASE in the last 168 hours. No results for input(s): AMMONIA in the last 168 hours.  CBC: Recent Labs  Lab 06/17/20 0049 06/19/20 0347  WBC 8.3 7.1  HGB 13.1 11.3*  HCT 40.3 33.2*  MCV 82.6 79.6*  PLT 184 131*    Cardiac Enzymes: No results for input(s): CKTOTAL, TROPONINI in the last 168 hours.  BNP: Invalid input(s): POCBNP  CBG: Recent Labs  Lab 06/19/20 1111 06/19/20 1634 06/19/20 1913 06/19/20 2314 06/20/20 0332  GLUCAP 165* 254* 225* 142* 138*    Microbiology: Recent Results (from  the past 720 hour(s))  Resp Panel by RT-PCR (Flu A&B, Covid)     Status: None   Collection Time: 06/17/20  6:00 AM  Result Value Ref Range Status   SARS Coronavirus 2 by RT PCR NEGATIVE NEGATIVE Final    Comment: (NOTE) SARS-CoV-2 target nucleic acids are NOT DETECTED.  The SARS-CoV-2 RNA is generally detectable in upper respiratory specimens during the acute phase of infection. The lowest concentration of SARS-CoV-2 viral copies this assay can detect is 138 copies/mL. A negative result does not preclude SARS-Cov-2 infection and should not be used as the sole basis for treatment  or other patient management decisions. A negative result may occur with  improper specimen collection/handling, submission of specimen other than nasopharyngeal swab, presence of viral mutation(s) within the areas targeted by this assay, and inadequate number of viral copies(<138 copies/mL). A negative result must be combined with clinical observations, patient history, and epidemiological information. The expected result is Negative.  Fact Sheet for Patients:  EntrepreneurPulse.com.au  Fact Sheet for Healthcare Providers:  IncredibleEmployment.be  This test is no t yet approved or cleared by the [place] FDA and  has been authorized for detection and/or diagnosis of SARS-CoV-2 by FDA under an Emergency Use Authorization (EUA). This EUA will remain  in effect (meaning this test can be used) for the duration of the COVID-19 declaration under Section 564(b)(1) of the Act, 21 U.S.C.section 360bbb-3(b)(1), unless the authorization is terminated  or revoked sooner.       Influenza A by PCR NEGATIVE NEGATIVE Final   Influenza B by PCR NEGATIVE NEGATIVE Final    Comment: (NOTE) The Xpert Xpress SARS-CoV-2/FLU/RSV plus assay is intended as an aid in the diagnosis of influenza from Nasopharyngeal swab specimens and should not be used as a sole basis for treatment. Nasal washings and aspirates are unacceptable for Xpert Xpress SARS-CoV-2/FLU/RSV testing.  Fact Sheet for Patients: EntrepreneurPulse.com.au  Fact Sheet for Healthcare Providers: IncredibleEmployment.be  This test is not yet approved or cleared by the [place] FDA and has been authorized for detection and/or diagnosis of SARS-CoV-2 by FDA under an Emergency Use Authorization (EUA). This EUA will remain in effect (meaning this test can be used) for the duration of the COVID-19 declaration under Section 564(b)(1) of the Act, 21 U.S.C. section  360bbb-3(b)(1), unless the authorization is terminated or revoked.  Performed at [facility], [address] [place], [place] [address]      Coagulation Studies: No results for input(s): LABPROT, INR in the last 72 hours.  Urinalysis: No results for input(s): COLORURINE, LABSPEC, PHURINE, GLUCOSEU, HGBUR, BILIRUBINUR, KETONESUR, PROTEINUR, UROBILINOGEN, NITRITE, LEUKOCYTESUR in the last 72 hours.  Invalid input(s): APPERANCEUR      Imaging: DG Chest Port 1 View  Result Date: 06/18/2020 CLINICAL DATA:  Shortness of breath.  COPD.  Central line placement. EXAM: PORTABLE CHEST 1 VIEW COMPARISON:  06/17/2020 FINDINGS: New left jugular central venous catheter is seen with tip overlying the distal left brachiocephalic vein, near the midline. No evidence of pneumothorax. Cardiomegaly remains stable. Diffuse pulmonary vascular congestion is unchanged. No evidence of pulmonary consolidation or pleural effusion. IMPRESSION: New left jugular central venous catheter tip overlies the distal left brachiocephalic vein. No evidence of pneumothorax. Stable cardiomegaly and diffuse pulmonary vascular congestion. Electronically Signed   By: Marlaine Hind M.D.   On: 06/18/2020 14:17    Scheduled Meds: . apixaban  5 mg Oral BID  . atorvastatin  80 mg Oral Daily  . Chlorhexidine Gluconate Cloth  6 each Topical Daily  . clopidogrel  75 mg Oral Daily  . DULoxetine  30 mg Oral Daily  . ezetimibe  10 mg Oral Daily  . gabapentin  400 mg Oral QID  . insulin aspart  0-20 Units Subcutaneous Q4H  . losartan  12.5 mg Oral Daily  . methylPREDNISolone (SOLU-MEDROL) injection  40 mg Intravenous Daily  . midodrine  5 mg Oral TID WC  . mometasone-formoterol  2 puff Inhalation BID  . multivitamin with minerals  1 tablet Oral Daily  . pantoprazole  40 mg Oral Daily  . sodium chloride flush  3 mL Intravenous Q12H  . tamsulosin  0.4 mg Oral QPC supper  . traZODone  150 mg Oral QHS  . umeclidinium bromide   1 puff Inhalation Daily  . Vericiguat  5 mg Oral Daily   Continuous Infusions: . sodium chloride    . furosemide (LASIX) 200 mg in dextrose 5% 100 mL (2mg /mL) infusion 8 mg/hr (06/20/20 0600)  . milrinone 0.25 mcg/kg/min (06/20/20 0600)   PRN Meds:.sodium chloride, acetaminophen, Ipratropium-Albuterol, ipratropium-albuterol, nitroGLYCERIN, ondansetron (ZOFRAN) IV, oxyCODONE, sodium chloride flush  Assessment & Plan: Pt is a 60 y.o.   male with history of CAD including stent in  LAD, RCA, last cath on August 2021 showing multivessel disease, chronic systolic CHF with EF less than 20%(01/20/2020), diabetes with neuropathy, peripheral arterial disease status post right BKA, atrial fibrillation on Eliquis, COPD, tobacco abuse, was admitted on 06/17/2020 with Bilirubinemia [E80.6] CHF (congestive heart failure) ([place]) [I50.9] Hyponatremia [E87.1] Tobacco abuse [Z72.0] Acute pulmonary edema ([place]) [J81.0] SOB (shortness of breath) [R06.02] CHF exacerbation ([place]) [I50.9] Troponin I above reference range [R77.8] AKI (acute kidney injury) ([place]) [N17.9] Chest pain [R07.9] Acute on chronic heart failure, unspecified heart failure type ([place]) [I50.9]   1. AKI with volume overload 2. Hyponatremia 3.  Acute exacerbation of chronic Systolic CHF 4. Cirrhosis 5.  Hypotension 6.  Atrial fibrillation  Patient presents with volume overload, exacerbation of chronic systolic CHF.  Diuresis is limited because of low blood pressure and atrial fibrillation. Last 2D echo from January 20, 2020 shows severely decreased LV function with global hypokinesis moderately enlarged right ventricle, moderate tricuspid regurgitation Hyponatremia likely due to volume overload  Lab Results  Component Value Date   CREATININE 1.47 (H) 06/20/2020   CREATININE 1.57 (H) 06/19/2020   CREATININE 1.58 (H) 06/18/2020     Plan: Urinalysis pending continue on IV Lasix infusion Currently Albumin is 3.8, if goes lower, will  consider IV albumin   Continue midodrine   Inotropic therapy as directed by Cardiology Flomax for urinary hesitancy Avoid hypotension We will follow      LOS: 3 Palmer Shorey 1/11/20229:01 AM    Note: This note was prepared with Dragon dictation. Any transcription errors are unintentional

## 2020-06-20 NOTE — Progress Notes (Signed)
Joint visit with Jill Poling. Patient was awake and talking upon arrival. Daughter [facility]) was at bedside. We prayed with patient and daughter. [name] was smiling and far more engaging than previous visit. AD has been filled out, with hard copy in chart bit has not been notarized. Daughter began to ask questions which I could not answer about the document. I asked patient if he wanted to share with daughter ,  his answer was no, with daughter present. I also had a brief conversation with nurse practitioner assigned to the patient, she believes the daughter is not aware of the document and she is not the person chosen to be his designee and would disagree. I am advising the signing and notarizing be done in private, when no family is present.

## 2020-06-20 NOTE — Progress Notes (Signed)
Progress Note  Patient Name: [name] [name] Date of Encounter: 06/20/2020  Primary Cardiologist: Rockey Situ  Subjective   Dyspnea improving. No chest pain or palpitations. Baseline orthopnea persists. Lasix gtt was increased to 8 mg/hr 1/10 with documented UOP of 2.8 for the past 24 hours and a net - 4.7 for the admission. Weight 104.1-->102.7 kg over the past 24 hours. Remains on milrinone gtt. Renal function improving.   Inpatient Medications    Scheduled Meds: . apixaban  5 mg Oral BID  . atorvastatin  80 mg Oral Daily  . Chlorhexidine Gluconate Cloth  6 each Topical Daily  . clopidogrel  75 mg Oral Daily  . DULoxetine  30 mg Oral Daily  . ezetimibe  10 mg Oral Daily  . gabapentin  400 mg Oral QID  . insulin aspart  0-20 Units Subcutaneous Q4H  . losartan  12.5 mg Oral Daily  . methylPREDNISolone (SOLU-MEDROL) injection  40 mg Intravenous Daily  . midodrine  5 mg Oral TID WC  . mometasone-formoterol  2 puff Inhalation BID  . multivitamin with minerals  1 tablet Oral Daily  . pantoprazole  40 mg Oral Daily  . sodium chloride flush  3 mL Intravenous Q12H  . tamsulosin  0.4 mg Oral QPC supper  . traZODone  150 mg Oral QHS  . umeclidinium bromide  1 puff Inhalation Daily  . Vericiguat  5 mg Oral Daily   Continuous Infusions: . sodium chloride    . furosemide (LASIX) 200 mg in dextrose 5% 100 mL (2mg /mL) infusion 8 mg/hr (06/20/20 0600)  . milrinone 0.25 mcg/kg/min (06/20/20 0600)   PRN Meds: sodium chloride, acetaminophen, Ipratropium-Albuterol, ipratropium-albuterol, nitroGLYCERIN, ondansetron (ZOFRAN) IV, oxyCODONE, sodium chloride flush   Vital Signs    Vitals:   06/20/20 0400 06/20/20 0500 06/20/20 0600 06/20/20 0700  BP: 103/71 107/67 114/65 102/64  Pulse: 95 86 96 77  Resp: 17 17 13 16   Temp: 98.6 F (37 C)   98.3 F (36.8 C)  TempSrc: Oral     SpO2: 98% 99% 98% 98%  Weight:  102.7 kg    Height:        Intake/Output Summary (Last 24 hours) at 06/20/2020  0858 Last data filed at 06/20/2020 0700 Gross per 24 hour  Intake 255.77 ml  Output 2375 ml  Net -2119.23 ml   Filed Weights   06/17/20 0036 06/19/20 0500 06/20/20 0500  Weight: 94.8 kg 104.1 kg 102.7 kg    Telemetry    Afib 90s to low 100s bpm with 6 beats of NSVT - Personally Reviewed  ECG    No new tracings - Personally Reviewed  Physical Exam   GEN: No acute distress.   Neck: No JVD. Cardiac: IRIR, no murmurs, rubs, or gallops.  Respiratory: Diminished breath sounds bilaterally with bibasilar crackles.  GI: Soft, nontender, non-distended.   MS: 1+ bilateral lower extremity edema, worse along the thighs; No deformity. Neuro:  Alert and oriented x 3; Nonfocal.  Psych: Normal affect.  Labs    Chemistry Recent Labs  Lab 06/17/20 0130 06/18/20 0412 06/19/20 0347 06/20/20 0539  NA 125* 125* 127* 129*  K 5.2* 5.5* 5.0 4.2  CL 90* 92* 92* 89*  CO2 25 24 26 29   GLUCOSE 124* 134* 134* 124*  BUN 34* 40* 48* 49*  CREATININE 1.50* 1.58* 1.57* 1.47*  CALCIUM 9.1 9.2 9.1 9.0  PROT 7.0  --   --   --   ALBUMIN 3.8  --   --   --  AST 81*  --   --   --   ALT 37  --   --   --   ALKPHOS 114  --   --   --   BILITOT 3.7*  --   --   --   GFRNONAA 54* 51* 51* 55*  ANIONGAP 10 9 9 11      Hematology Recent Labs  Lab 06/17/20 0049 06/19/20 0347  WBC 8.3 7.1  RBC 4.88 4.17*  HGB 13.1 11.3*  HCT 40.3 33.2*  MCV 82.6 79.6*  MCH 26.8 27.1  MCHC 32.5 34.0  RDW 21.9* 21.5*  PLT 184 131*    Cardiac EnzymesNo results for input(s): TROPONINI in the last 168 hours. No results for input(s): TROPIPOC in the last 168 hours.   BNP Recent Labs  Lab 06/14/20 1413 06/17/20 0049  BNP 2,062.1* 1,925.5*     DDimer No results for input(s): DDIMER in the last 168 hours.   Radiology    DG Chest Port 1 View  Result Date: 06/18/2020 IMPRESSION: New left jugular central venous catheter tip overlies the distal left brachiocephalic vein. No evidence of pneumothorax. Stable  cardiomegaly and diffuse pulmonary vascular congestion. Electronically Signed   By: Marlaine Hind M.D.   On: 06/18/2020 14:17    Cardiac Studies   2D echo 06/17/2020: 1. Left ventricular ejection fraction, by estimation, is <20%. Left  ventricular ejection fraction by PLAX is 14 %. The left ventricle has  severely decreased function. The left ventricle demonstrates global  hypokinesis. The left ventricular internal  cavity size was severely dilated. Left ventricular diastolic parameters  are indeterminate.  2. Right ventricular systolic function is severely reduced. The right  ventricular size is moderately enlarged. There is severely elevated  pulmonary artery systolic pressure. The estimated right ventricular  systolic pressure is 60.1 mmHg.  3. Left atrial size was severely dilated.  4. Right atrial size was severely dilated.  5. Tricuspid valve regurgitation is mild to moderate.  6. The inferior vena cava is dilated in size with <50% respiratory  variability, suggesting right atrial pressure of 15 mmHg. __________  01/2020:  Prox RCA-1 lesion is 80% stenosed.  Prox RCA-2 lesion is 20% stenosed.  Prox RCA to Mid RCA lesion is 80% stenosed.  Prox LAD lesion is 20% stenosed.  1st Diag lesion is 80% stenosed.  Mid LAD lesion is 20% stenosed.  Dist LAD lesion is 60% stenosed.  Prox Cx to Mid Cx lesion is 90% stenosed.  LPAV lesion is 95% stenosed.   1. Significant underlying three-vessel coronary artery disease. Patent proximal LAD stent with mild in-stent restenosis. Moderate diffuse disease in the distal LAD. Significant stenosis in the proximal left circumflex at the origin of the posterior AV groove artery which is also heavily diseased at the ostium. This is a bifurcation lesion and heavily calcified. RCA stent is patent. However, there is significant proximal disease in the whole mid to distal segment is diffusely diseased and small caliber. 2. Left ventricular  angiography was not performed. EF was severe reduced by echo. 3. Moderately elevated left ventricular end-diastolic pressure at 28 mmHg  Recommendations: The patient's cardiomyopathy seems to be out of proportion to his coronary artery disease as the LAD itself does not seem to have obstructive disease at the present time. The RCA is diffusely diseased and too small to stent. The only target for revascularization would be the left circumflex but it is a calcified bifurcation lesion and likely requires atherectomy. In order to  do this safely, the patient has to be optimized from a heart failure standpoint as he appears to be volume overloaded. Recommend intravenous diuresis. If limited by hypotension, we might need to consider inotropic support and small dose norepinephrine drip initially. I added small dose digoxin. Treat underlying atrial fibrillation. Resume heparin drip 8 hours after sheath pull. __________  2D echo 01/2020: 1. Left ventricular ejection fraction, by estimation, is <20%. The left  ventricle has severely decreased function. The left ventricle demonstrates  global hypokinesis. The left ventricular internal cavity size was  moderately dilated. Left ventricular  diastolic parameters are indeterminate.  2. Right ventricular systolic function is moderately reduced. The right  ventricular size is moderately enlarged. There is normal pulmonary artery  systolic pressure. The estimated right ventricular systolic pressure is  63.3 mmHg.  3. Left atrial size was mildly dilated.  4. Mild mitral valve regurgitation.  5. Tricuspid valve regurgitation is moderate. __________  2D echo 09/2019: 1. Left ventricular ejection fraction, by estimation, is 20 to 25%. The  left ventricle has severely decreased function. The left ventricle  demonstrates global hypokinesis. The left ventricular internal cavity size  was severely dilated. There is mild  left ventricular hypertrophy.  Indeterminate diastolic filling due to E-A  fusion.  2. Right ventricular systolic function is moderately reduced. The right  ventricular size is mildly enlarged. There is moderately elevated  pulmonary artery systolic pressure. The estimated right ventricular  systolic pressure is 35.4 mmHg.  3. Left atrial size was mildly dilated.  4. Right atrial size was moderately dilated.  5. The mitral valve is degenerative. Mild mitral valve regurgitation. No  evidence of mitral stenosis.  6. Tricuspid valve regurgitation is mild to moderate.  7. The aortic valve was not well visualized. Aortic valve regurgitation  is not visualized. Mild aortic valve sclerosis is present, with no  evidence of aortic valve stenosis.  8. The inferior vena cava is dilated in size with <50% respiratory  variability, suggesting right atrial pressure of 15 mmHg. __________  2D Echo 10/2018: 1. The left ventricle has severely reduced systolic function, with an ejection fraction of 20-25%. The cavity size was moderately dilated. Indeterminate diastolic filling due to E-A fusion. Left ventricular diffuse hypokinesis. 2. The right ventricle has severely reduced systolic function. The cavity was mildly enlarged. There is no increase in right ventricular wall thickness. Right ventricular systolic pressure is moderately elevated with an estimated pressure of 59.6 mmHg. 3. Left atrial size was mildly dilated. 4. Right atrial size was mildly dilated. 5. The mitral valve is degenerative. Mild thickening of the mitral valve leaflet. Mild calcification of the mitral valve leaflet. There is mild mitral annular calcification present. 6. The tricuspid valve is degenerative. Tricuspid valve regurgitation is moderate. 7. The aortic valve was not well visualized. Moderate thickening of the aortic valve. Moderate calcification of the aortic valve. 8. The inferior vena cava was dilated in size with <50% respiratory  variability. __________  [facility] 04/2017:  Prox RCA-1 lesion is 70% stenosed.  Prox RCA-2 lesion is 20% stenosed.  Mid RCA lesion is 80% stenosed.  Mid RCA to Dist RCA lesion is 70% stenosed.  Ost LAD to Prox LAD lesion is 30% stenosed.  Ost 1st Diag to 1st Diag lesion is 80% stenosed.  Prox LAD to Mid LAD lesion is 60% stenosed.  Dist LAD lesion is 60% stenosed.  Prox Cx to Mid Cx lesion is 90% stenosed.  Ost 1st Mrg to 1st Mrg lesion is 85%  stenosed.  1. Significant three-vessel coronary artery disease with patent stent in the RCA and LAD. There is significant proximal RCA disease before the stent as well as diffuse mid and distal disease in a vessel that appears to be about 2.5 mm in diameter. There is significant bifurcation stenosis in the proximal left circumflex with a large OM1. The LAD has moderate disease. The coronary arteries are moderately calcified and diffusely diseased throughout. 2. Left ventricular angiography was not performed. EF was moderately to severely reduced by echo. 3. Severely elevated left ventricular end-diastolic pressure at 34 mmHg.  Recommendations: This is overall a difficult situation. Revascularization options are somewhat limited due to diffuse disease overall. Given that he is diabetic and has cardiomyopathy, best option might be CABG if he is found to be a candidate. We need to optimize his heart failure first with outpatient referral to cardiothoracic surgery for evaluation.  I am going to increase intravenous diuresis today and the patient possibly can be discharged home tomorrow on medical therapy.  Patient Profile     60 y.o. 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  Assessment & Plan    1. BiV failure/ICM/pumonary hypertension: -He remains volume up -Continue milrinone and midodrine  -Continue Lasix gtt 8 mg/hr -[place] with O2 saturation 79.9 this morning  -Not a candidate for invasive advanced heart failure therapies in the setting of medical noncompliance  -Outlook is poor  -Stop losartan with soft BP -Start spironolactone 12.5 mg -Not currently able to resume further GDMT in the setting of hypotension and AKI requiring milrinone and midodrine, escalate as able -Now a DNR -Long history of medical nonadherence  -Appreciate Palliative Care assistance   2. CAD involving the native coronary arteries with elevated troponin: -Patient previously declined CABG at [facility] -Most recent [facility] in 01/2020 showed no great targets for PCI with consideration for atherectomy of LCx, but this could not be completed as the patient left AMA -HS-Tn minimally elevated at 54, not consistent with ACS -Plavix -Eliquis as below in place of ASA -No plans for inpatient ischemic evaluation   3. Persistent Afib: -He remains in Afib with mostly controlled ventricular response -History of noncompliance with Eliquis  -Continue Eliquis -CHADS2VASc 4 (CHF, HTN, DM, vascular disease) -Not a candidate for rhythm control strategy given severely dilated left atrium on echo and with prior medical noncompliance   4. AKI: -Renal function is improving  -Monitor with diuresis   5. HTN: -Blood pressure has been soft, requiring use of midodrine and milrinone   6. HLD: -LDL 77 from 01/2020 with goal being < 70, previously well controlled in 11/2018 -Lipitor 80 mg, Zetia 10 mg -Follow up as outpatient   For questions or updates, please contact [place] Please consult [email or web address]  for contact info under Cardiology/STEMI.    Signed, Christell Faith, PA-C [place] Pager: [phone] 06/20/2020, 8:58 AM

## 2020-06-20 NOTE — Progress Notes (Signed)
PT Cancellation Note  Patient Details Name: [name] [name] MRN: [id number] DOB: 19-Dec-1960   Cancelled Treatment:    Reason Eval/Treat Not Completed: Medical issues which prohibited therapy (Consult received and chart reviewed. Per primary RN, patient remains on multiple drips, significant SOB with minimal activity (i.e., eating); unable to tolerate PT evaluation at this time.  Will re-attempt at later time/date as medically appropriate.)   [name] H. Owens Shark, PT, DPT, NCS 06/20/20, 2:24 PM [phone]

## 2020-06-20 NOTE — Progress Notes (Signed)
OT Cancellation Note  Patient Details Name: [name] [name] MRN: [id number] DOB: 1961/01/19   Cancelled Treatment:    Reason Eval/Treat Not Completed: Medical issues which prohibited therapy. Chart reviewed. Per primary RN/PT, patient remains on multiple drips, significant SOB with minimal activity (i.e., eating); unable to tolerate OT at this time.  Will re-attempt at later time/date as medically appropriate.  Jeni Salles, MPH, MS, OTR/L ascom [phone] 06/20/20, 3:29 PM

## 2020-06-20 NOTE — Progress Notes (Addendum)
Patient alert, on 2 liters. Intermit shortness of breath with exertions. Using urinal with good output. Patient has had complaints of neck pain. Right IJ intact, some swelling and ecchymosis noted. Dr Mortimer Fries with contact Dr End about possibly stopping anticoagulation. At this time no new orders. Continue to monitor. Patient refused to be checked and cleaned throughout the day.

## 2020-06-20 NOTE — Progress Notes (Addendum)
[place] and [place] visited pt. per family request for prayer; pt.'s S.O. at bedside; pt. awake, seeming alert and amenable to visit.  S.O. requested prayer for strength for her and family and for further recovery for pt.  CHs prayed for pt. together and remain available as needed.

## 2020-06-21 ENCOUNTER — Ambulatory Visit: Payer: Medicare Other

## 2020-06-21 ENCOUNTER — Encounter: Payer: Self-pay | Admitting: Internal Medicine

## 2020-06-21 DIAGNOSIS — I5023 Acute on chronic systolic (congestive) heart failure: Secondary | ICD-10-CM | POA: Diagnosis not present

## 2020-06-21 DIAGNOSIS — Z7189 Other specified counseling: Secondary | ICD-10-CM | POA: Diagnosis not present

## 2020-06-21 DIAGNOSIS — Z515 Encounter for palliative care: Secondary | ICD-10-CM | POA: Diagnosis not present

## 2020-06-21 DIAGNOSIS — I4819 Other persistent atrial fibrillation: Secondary | ICD-10-CM | POA: Diagnosis not present

## 2020-06-21 DIAGNOSIS — I5033 Acute on chronic diastolic (congestive) heart failure: Secondary | ICD-10-CM | POA: Diagnosis not present

## 2020-06-21 DIAGNOSIS — I5021 Acute systolic (congestive) heart failure: Secondary | ICD-10-CM | POA: Diagnosis not present

## 2020-06-21 LAB — BASIC METABOLIC PANEL
Anion gap: 10 (ref 5–15)
BUN: 40 mg/dL — ABNORMAL HIGH (ref 6–20)
CO2: 34 mmol/L — ABNORMAL HIGH (ref 22–32)
Calcium: 9 mg/dL (ref 8.9–10.3)
Chloride: 90 mmol/L — ABNORMAL LOW (ref 98–111)
Creatinine, Ser: 1.15 mg/dL (ref 0.61–1.24)
GFR, Estimated: >60 mL/min (ref >60)
Glucose, Bld: 124 mg/dL — ABNORMAL HIGH (ref 70–99)
Potassium: 3.8 mmol/L (ref 3.5–5.1)
Sodium: 134 mmol/L — ABNORMAL LOW (ref 135–145)

## 2020-06-21 LAB — CBC WITH DIFFERENTIAL/PLATELET
Abs Immature Granulocytes: 0.07 10*3/uL (ref 0.00–0.07)
Basophils Absolute: 0 10*3/uL (ref 0.0–0.1)
Basophils Relative: 0 %
Eosinophils Absolute: 0 10*3/uL (ref 0.0–0.5)
Eosinophils Relative: 0 %
HCT: 34.2 % — ABNORMAL LOW (ref 39.0–52.0)
Hemoglobin: 11.3 g/dL — ABNORMAL LOW (ref 13.0–17.0)
Immature Granulocytes: 1 %
Lymphocytes Relative: 9 %
Lymphs Abs: 0.5 10*3/uL — ABNORMAL LOW (ref 0.7–4.0)
MCH: 26.7 pg (ref 26.0–34.0)
MCHC: 33 g/dL (ref 30.0–36.0)
MCV: 80.9 fL (ref 80.0–100.0)
Monocytes Absolute: 0.5 10*3/uL (ref 0.1–1.0)
Monocytes Relative: 9 %
Neutro Abs: 4.6 10*3/uL (ref 1.7–7.7)
Neutrophils Relative %: 81 %
Platelets: 123 10*3/uL — ABNORMAL LOW (ref 150–400)
RBC: 4.23 MIL/uL (ref 4.22–5.81)
RDW: 21.4 % — ABNORMAL HIGH (ref 11.5–15.5)
Smear Review: NORMAL
WBC: 5.7 10*3/uL (ref 4.0–10.5)
nRBC: 0 % (ref 0.0–0.2)

## 2020-06-21 LAB — MAGNESIUM: Magnesium: 2.1 mg/dL (ref 1.7–2.4)

## 2020-06-21 LAB — PHOSPHORUS: Phosphorus: 3.2 mg/dL (ref 2.5–4.6)

## 2020-06-21 LAB — COOXEMETRY PANEL
Carboxyhemoglobin: 1.8 % — ABNORMAL HIGH (ref 0.5–1.5)
Methemoglobin: 0.7 % (ref 0.0–1.5)
O2 Saturation: 79.2 %

## 2020-06-21 LAB — GLUCOSE, CAPILLARY
Glucose-Capillary: 129 mg/dL — ABNORMAL HIGH (ref 70–99)
Glucose-Capillary: 118 mg/dL — ABNORMAL HIGH (ref 70–99)
Glucose-Capillary: 170 mg/dL — ABNORMAL HIGH (ref 70–99)
Glucose-Capillary: 232 mg/dL — ABNORMAL HIGH (ref 70–99)

## 2020-06-21 NOTE — Progress Notes (Signed)
[address] [place], [place] [address] Phone [phone]. Fax [phone]  Date: 06/21/2020             Presenting illness: Patient is a 60 y.o. male  admitted to [facility] on 06/17/2020 for evaluation of Bilirubinemia [E80.6] CHF (congestive heart failure) ([place]) [I50.9] Hyponatremia [E87.1] Tobacco abuse [Z72.0] Acute pulmonary edema ([place]) [J81.0] SOB (shortness of breath) [R06.02] CHF exacerbation ([place]) [I50.9] Troponin I above reference range [R77.8] AKI (acute kidney injury) ([place]) [N17.9] Chest pain [R07.9] Acute on chronic heart failure, unspecified heart failure type ([place]) [I50.9]  Patient has significant coronary artery disease, with history of LAD, RCA stent, last cath on August 2021 showing multivessel disease, chronic systolic CHF with EF less than 20%(01/20/2020), diabetes with neuropathy, peripheral arterial disease status post right BKA, atrial fibrillation on Eliquis, COPD, tobacco abuse. Presented to the emergency room with shortness of breath, lower extremity edema Baseline creatinine of 1.10, presenting creatinine of 1.50 on January 8. Also noted to have low sodium of 125   1/9- lasix drip started and patient moved to icu for ionotropes  06/21/2020: Continued on IV Lasix infusion as well as milrinone.  Urine output of 7 L over the last 24 hours.  States that he is edema is improving and skin is less tight.  Able to eat without nausea or vomiting     Vital Signs: Blood pressure 108/74, pulse (!) 102, temperature (!) 97.5 F (36.4 C), resp. rate 15, height 5\' 7"  (1.702 m), weight 102.8 kg, SpO2 94 %.   Intake/Output Summary (Last 24 hours) at 06/21/2020 0912 Last data filed at 06/21/2020 1829 Gross per 24 hour  Intake 456.99 ml  Output 8625 ml  Net -8168.01 ml    Weight trends: Filed Weights   06/19/20 0500 06/20/20 0500 06/21/20 0500  Weight: 104.1 kg 102.7 kg 102.8 kg    Physical Exam: General:  Laying in the bed, no acute distress  HEENT   moist oral mucous membranes, anicteric  Neck:  Supple, no masses  Lungs:  Mild basilar crackles, [place] O2  Heart::  Irregular, tachycardic, 2/6 systolic murmur  Abdomen:  Distended, ascites  Extremities:  Tight lower extremity edema in the left up to abdomen, right BKA  Neurologic:  Alert and oriented  Skin:  Congestive changes in the left leg    Lab results: Basic Metabolic Panel: Recent Labs  Lab 06/19/20 0347 06/20/20 0539 06/21/20 0539  NA 127* 129* 134*  K 5.0 4.2 3.8  CL 92* 89* 90*  CO2 26 29 34*  GLUCOSE 134* 124* 124*  BUN 48* 49* 40*  CREATININE 1.57* 1.47* 1.15  CALCIUM 9.1 9.0 9.0  MG 2.2  --  2.1  PHOS 3.9  --  3.2    Liver Function Tests: Recent Labs  Lab 06/17/20 0130  AST 81*  ALT 37  ALKPHOS 114  BILITOT 3.7*  PROT 7.0  ALBUMIN 3.8   No results for input(s): LIPASE, AMYLASE in the last 168 hours. No results for input(s): AMMONIA in the last 168 hours.  CBC: Recent Labs  Lab 06/19/20 0347 06/21/20 0539  WBC 7.1 5.7  NEUTROABS  --  4.6  HGB 11.3* 11.3*  HCT 33.2* 34.2*  MCV 79.6* 80.9  PLT 131* 123*    Cardiac Enzymes: No results for input(s): CKTOTAL, TROPONINI in the last 168 hours.  BNP: Invalid input(s): POCBNP  CBG: Recent Labs  Lab 06/20/20 1553 06/20/20 1936 06/20/20 2341 06/21/20 0524 06/21/20 0737  GLUCAP 223* 174* 160* 129*  118*    Microbiology: Recent Results (from the past 720 hour(s))  Resp Panel by RT-PCR (Flu A&B, Covid)     Status: None   Collection Time: 06/17/20  6:00 AM  Result Value Ref Range Status   SARS Coronavirus 2 by RT PCR NEGATIVE NEGATIVE Final    Comment: (NOTE) SARS-CoV-2 target nucleic acids are NOT DETECTED.  The SARS-CoV-2 RNA is generally detectable in upper respiratory specimens during the acute phase of infection. The lowest concentration of SARS-CoV-2 viral copies this assay can detect is 138 copies/mL. A negative result does not preclude SARS-Cov-2 infection and should not be used  as the sole basis for treatment or other patient management decisions. A negative result may occur with  improper specimen collection/handling, submission of specimen other than nasopharyngeal swab, presence of viral mutation(s) within the areas targeted by this assay, and inadequate number of viral copies(<138 copies/mL). A negative result must be combined with clinical observations, patient history, and epidemiological information. The expected result is Negative.  Fact Sheet for Patients:  EntrepreneurPulse.com.au  Fact Sheet for Healthcare Providers:  IncredibleEmployment.be  This test is no t yet approved or cleared by the [place] FDA and  has been authorized for detection and/or diagnosis of SARS-CoV-2 by FDA under an Emergency Use Authorization (EUA). This EUA will remain  in effect (meaning this test can be used) for the duration of the COVID-19 declaration under Section 564(b)(1) of the Act, 21 U.S.C.section 360bbb-3(b)(1), unless the authorization is terminated  or revoked sooner.       Influenza A by PCR NEGATIVE NEGATIVE Final   Influenza B by PCR NEGATIVE NEGATIVE Final    Comment: (NOTE) The Xpert Xpress SARS-CoV-2/FLU/RSV plus assay is intended as an aid in the diagnosis of influenza from Nasopharyngeal swab specimens and should not be used as a sole basis for treatment. Nasal washings and aspirates are unacceptable for Xpert Xpress SARS-CoV-2/FLU/RSV testing.  Fact Sheet for Patients: EntrepreneurPulse.com.au  Fact Sheet for Healthcare Providers: IncredibleEmployment.be  This test is not yet approved or cleared by the [place] FDA and has been authorized for detection and/or diagnosis of SARS-CoV-2 by FDA under an Emergency Use Authorization (EUA). This EUA will remain in effect (meaning this test can be used) for the duration of the COVID-19 declaration under Section 564(b)(1)  of the Act, 21 U.S.C. section 360bbb-3(b)(1), unless the authorization is terminated or revoked.  Performed at [facility], [address] [place],  [address]      Coagulation Studies: No results for input(s): LABPROT, INR in the last 72 hours.  Urinalysis: No results for input(s): COLORURINE, LABSPEC, PHURINE, GLUCOSEU, HGBUR, BILIRUBINUR, KETONESUR, PROTEINUR, UROBILINOGEN, NITRITE, LEUKOCYTESUR in the last 72 hours.  Invalid input(s): APPERANCEUR      Imaging: No results found.  Scheduled Meds: . atorvastatin  80 mg Oral Daily  . Chlorhexidine Gluconate Cloth  6 each Topical Daily  . DULoxetine  30 mg Oral Daily  . ezetimibe  10 mg Oral Daily  . gabapentin  400 mg Oral QID  . insulin aspart  0-20 Units Subcutaneous Q4H  . methylPREDNISolone (SOLU-MEDROL) injection  40 mg Intravenous Daily  . midodrine  5 mg Oral TID WC  . mometasone-formoterol  2 puff Inhalation BID  . multivitamin with minerals  1 tablet Oral Daily  . pantoprazole  40 mg Oral Daily  . sodium chloride flush  3 mL Intravenous Q12H  . spironolactone  12.5 mg Oral Daily  . tamsulosin  0.4 mg Oral  QPC supper  . traZODone  150 mg Oral QHS  . umeclidinium bromide  1 puff Inhalation Daily  . Vericiguat  5 mg Oral Daily   Continuous Infusions: . sodium chloride    . furosemide (LASIX) 200 mg in dextrose 5% 100 mL (2mg /mL) infusion 8 mg/hr (06/21/20 0600)  . milrinone 0.125 mcg/kg/min (06/21/20 0600)   PRN Meds:.sodium chloride, acetaminophen, Ipratropium-Albuterol, ipratropium-albuterol, nitroGLYCERIN, ondansetron (ZOFRAN) IV, oxyCODONE, sodium chloride flush  Assessment & Plan: Pt is a 60 y.o.   male with history of CAD including stent in  LAD, RCA, last cath on August 2021 showing multivessel disease, chronic systolic CHF with EF less than 20%(01/20/2020), diabetes with neuropathy, peripheral arterial disease status post right BKA, atrial fibrillation on Eliquis, COPD, tobacco abuse,  was admitted on 06/17/2020 with Bilirubinemia [E80.6] CHF (congestive heart failure) ([place]) [I50.9] Hyponatremia [E87.1] Tobacco abuse [Z72.0] Acute pulmonary edema ([place]) [J81.0] SOB (shortness of breath) [R06.02] CHF exacerbation ([place]) [I50.9] Troponin I above reference range [R77.8] AKI (acute kidney injury) ([place]) [N17.9] Chest pain [R07.9] Acute on chronic heart failure, unspecified heart failure type ([place]) [I50.9]   1. AKI with volume overload 2. Hyponatremia, likely due to volume overload 3.  Acute exacerbation of chronic Systolic CHF. Last 2D echo from June 17, 2020 showed LVEF less than 20% with severely decreased LV function, global hypokinesis.  Left ventricular internal cavity size severely dilated.  Right ventricular function severely reduced with severely elevated pulmonary artery systolic pressure of 53 mm.  Severely dilated left atrium and right atrium with mild to moderate tricuspid regurgitation. 4. Cirrhosis 5.  Hypotension 6.  Atrial fibrillation  Patient presents with volume overload, exacerbation of chronic systolic CHF.    Lab Results  Component Value Date   CREATININE 1.15 06/21/2020   CREATININE 1.47 (H) 06/20/2020   CREATININE 1.57 (H) 06/19/2020   01/11 0701 - 01/12 0700 In: 457 [P.O.:240; I.V.:217] Out: 7725 [Urine:7725]   Plan: Urinalysis pending continue on IV Lasix infusion-decrease rate to 6 mg/h Currently Albumin is 3.8, if goes lower, will consider IV albumin   Continue midodrine   Inotropic therapy as directed by Cardiology Flomax for urinary hesitancy Avoid hypotension  Nothing further to add from renal standpoint.  We will sign off.  Lasix drip can be converted to torsemide(20mg ) p.o. daily when ready.  ARB can be restarted at any time once blood pressure is higher and he is able to tolerate it.      LOS: 4 Moneisha Vosler 1/12/20229:12 AM    Note: This note was prepared with Dragon dictation. Any transcription errors are  unintentional

## 2020-06-21 NOTE — Evaluation (Signed)
Physical Therapy Evaluation Patient Details Name: [name] [name] MRN: [id number] DOB: 08-May-1961 Today's Date: 06/21/2020   History of Present Illness  Pt is a 60 y/o M with PMH: Arthritis, Asthma, Atherosclerosis, BPH, Charcot's joint of foot-right, Chronic combined systolic and diastolic heart failure, COPD, CAD/PCI, tobacco abuse, PAD s/p L SFA stenting, s/p R BKA, afib on Eliquis, OSA noncompliant with CPAP, and h/o leaving AMA/medical noncompliance. Pt presented d/t SOB, DOE and fluid retention in LEs. Trop slightly elevated, Cardiology note indicates: "patient declined CABG in the past at [facility]; He had a cath in August showing no great PCI targets but considered atherectomy of Lcx however patient eventually left AMA, No plan for ischemic work-up at this time". Admitted acutely for management of acute/chronic CHF, requiring for milrinone drip.  Clinical Impression  Patient resting in bed upon arrival to room; agreeable to participation with session as able.  Alert and oriented; follows commands and shows good effort with mobility tasks. Bilat UE/LE strength and ROM grossly WFL for basic transfers and mobility, though generally deconditioned due to chronic illness; no focal weakness/asymmetry appreciated (history of R BKA)  Noted scabbed areas to L forefoot/toes during eval; RN informed/aware.  Currently requiring min assist for bed mobility; sup/mod indep for unsupported sitting balance; mod assist +2 for squat pivot transfers.  Mod SOB with exertion; HR 120-130s with minimal activity.  Additional activity deferred due to patient fatigue; will continue to assess/progress as medically appropriate. Would benefit from skilled PT to address above deficits and promote optimal return to PLOF.; recommend transition to STR upon discharge from acute hospitalization.     Follow Up Recommendations SNF    Equipment Recommendations   (if returns home, may benefit from hospital bed)    Recommendations for  Other Services       Precautions / Restrictions Precautions Precautions: Fall Restrictions Weight Bearing Restrictions: No      Mobility  Bed Mobility Overal bed mobility: Needs Assistance Bed Mobility: Supine to Sit     Supine to sit: Min assist     General bed mobility comments: heavy use of UEs to assist    Transfers Overall transfer level: Needs assistance   Transfers: Squat Pivot Transfers     Squat pivot transfers: Mod assist;+2 physical assistance     General transfer comment: assist for lift off, overall stability; patient with good awareness of transfer sequencing  Ambulation/Gait             General Gait Details: unsafe/unable  Stairs            Wheelchair Mobility    Modified Rankin (Stroke Patients Only)       Balance Overall balance assessment: Needs assistance Sitting-balance support: No upper extremity supported;Feet supported Sitting balance-Leahy Scale: Good                                       Pertinent Vitals/Pain Pain Assessment: No/denies pain    Home Living Family/patient expects to be discharged to:: Private residence Living Arrangements: Spouse/significant other Available Help at Discharge: Family Type of Home: House Home Access: Ramped entrance     Home Layout: One level Home Equipment: Environmental consultant - 2 wheels;Cane - single point;Wheelchair - Brewing technologist;Toilet riser      Prior Function Level of Independence: Needs assistance   Gait / Transfers Assistance Needed: Pt reports w/c use at baseline for fxl mobility, does ambulate short  distances wtih his prosthetic and UE support-describes furniture cruising. Pt states he is able to transfer himself.  ADL's / Homemaking Assistance Needed: Pt states he is able to perform all basic self care I'ly, his significant other performs HH IADLs such as cooking, cleaning, and laundry.  Comments: WC level (manual) as primary mobility, but does ambulate short  distances (requiring UE support at all times, "I hold on to things close by").     Hand Dominance   Dominant Hand: Right    Extremity/Trunk Assessment   Upper Extremity Assessment Upper Extremity Assessment: Overall WFL for tasks assessed    Lower Extremity Assessment Lower Extremity Assessment: Overall WFL for tasks assessed (grossly 4-/5 throughout)       Communication   Communication: No difficulties  Cognition Arousal/Alertness: Awake/alert Behavior During Therapy: WFL for tasks assessed/performed Overall Cognitive Status: Within Functional Limits for tasks assessed                                        General Comments      Exercises Other Exercises Other Exercises: Toilet transfer, squat pivot transfer, mod assist +2 for lift off, pivot and overall balance/safety; dep assist for peri-care.  Mod SOB with exertion; HR 120-130s with minimal activity.   Assessment/Plan    PT Assessment Patient needs continued PT services  PT Problem List Decreased strength;Decreased range of motion;Decreased activity tolerance;Decreased balance;Decreased mobility;Decreased coordination;Decreased cognition;Decreased knowledge of use of DME;Decreased safety awareness;Decreased knowledge of precautions;Cardiopulmonary status limiting activity;Decreased skin integrity;Obesity       PT Treatment Interventions DME instruction;Functional mobility training;Therapeutic activities;Therapeutic exercise;Balance training;Patient/family education    PT Goals (Current goals can be found in the Care Plan section)  Acute Rehab PT Goals Patient Stated Goal: to go home PT Goal Formulation: With patient Time For Goal Achievement: 07/05/20 Potential to Achieve Goals: Fair    Frequency Min 2X/week   Barriers to discharge        Co-evaluation               AM-PAC PT "6 Clicks" Mobility  Outcome Measure Help needed turning from your back to your side while in a flat bed  without using bedrails?: A Little Help needed moving from lying on your back to sitting on the side of a flat bed without using bedrails?: A Little Help needed moving to and from a bed to a chair (including a wheelchair)?: A Lot Help needed standing up from a chair using your arms (e.g., wheelchair or bedside chair)?: A Lot Help needed to walk in hospital room?: Total Help needed climbing 3-5 steps with a railing? : Total 6 Click Score: 12    End of Session   Activity Tolerance: Patient tolerated treatment well Patient left: in bed;with call bell/phone within reach;with bed alarm set Nurse Communication: Mobility status PT Visit Diagnosis: Muscle weakness (generalized) (M62.81);Difficulty in walking, not elsewhere classified (R26.2)    Time: 9622-2979 PT Time Calculation (min) (ACUTE ONLY): 26 min   Charges:   PT Evaluation $PT Eval Moderate Complexity: 1 Mod PT Treatments $Therapeutic Activity: 8-22 mins       Cambri Plourde H. Owens Shark, PT, DPT, NCS 06/21/20, 5:30 PM [phone]

## 2020-06-21 NOTE — Progress Notes (Signed)
CRITICAL CARE NOTE 60 y.o.malewith a hx of ICM EF 20-25%, CAD/PCI, tobacco abuse, COPD, PAD s/p L SFA stenting, s/p R BKA, afib on Eliquis, OSA noncompliant with CPAP, hx of leaving AMA and noncompliancewho is being seen today for the evaluation ofheart failure.presents to the emergency room with shortness of breath, fluid retention in his legs and intermittent chest pain. He uses albuterol which helped a bit but continues to have dyspnea with exertion. He denies cough, fever or chills  Cardiology Consulted recommend ICU admission for inotrope Nephrology consulted for progressive cardiorenal syndrome-placed on lasix infusion   EVENTS/Hospital Course 1/9 admission to ICU for severe heart failure 1/9 CVL placed 1/10 on milrinone infusion/lasic infusion 1/11 severe end stage CHF, prognosis is poor 1/12 stopped eliquis and plavix  CC  follow up respiratory failure  SUBJECTIVE Patient remains critically ill Prognosis is guarded Alert and awake  BP 108/74   Pulse (!) 102   Temp (!) 97.5 F (36.4 C)   Resp 15   Ht 5\' 7"  (1.702 m)   Wt 102.8 kg   SpO2 94%   BMI 35.50 kg/m    I/O last 3 completed shifts: In: 578.6 [P.O.:240; I.V.:338.6] Out: 9375 [Urine:9375] No intake/output data recorded.  SpO2: 94 % O2 Flow Rate (L/min): 2 L/min  Estimated body mass index is 35.5 kg/m as calculated from the following:   Height as of this encounter: 5\' 7"  (1.702 m).   Weight as of this encounter: 102.8 kg.   Review of Systems:  Gen:  fatigued HEENT: Denies blurred vision, double vision, ear pain, eye pain, hearing loss, nose bleeds, sore throat Cardiac:  No dizziness, chest pain or heaviness, chest tightness,edema, No JVD Resp:   +SOB Other:  All other systems negative   PHYSICAL EXAMINATION:  GENERAL:critically ill appearing,  HEAD: Normocephalic, atraumatic.  EYES: Pupils equal, round, reactive to light.  No scleral icterus.  MOUTH: Moist mucosal membrane. NECK:  Supple.  PULMONARY: +rhonchi,  CARDIOVASCULAR: S1 and S2. Regular rate and rhythm. No murmurs, rubs, or gallops.  GASTROINTESTINAL: Soft, nontender, -distended.  Positive bowel sounds.   MUSCULOSKELETALleft leg black toes, +edema NEUROLOGIC:  SKIN:intact,warm,dry  MEDICATIONS: I have reviewed all medications and confirmed regimen as documented   CULTURE RESULTS   Recent Results (from the past 240 hour(s))  Resp Panel by RT-PCR (Flu A&B, Covid)     Status: None   Collection Time: 06/17/20  6:00 AM  Result Value Ref Range Status   SARS Coronavirus 2 by RT PCR NEGATIVE NEGATIVE Final    Comment: (NOTE) SARS-CoV-2 target nucleic acids are NOT DETECTED.  The SARS-CoV-2 RNA is generally detectable in upper respiratory specimens during the acute phase of infection. The lowest concentration of SARS-CoV-2 viral copies this assay can detect is 138 copies/mL. A negative result does not preclude SARS-Cov-2 infection and should not be used as the sole basis for treatment or other patient management decisions. A negative result may occur with  improper specimen collection/handling, submission of specimen other than nasopharyngeal swab, presence of viral mutation(s) within the areas targeted by this assay, and inadequate number of viral copies(<138 copies/mL). A negative result must be combined with clinical observations, patient history, and epidemiological information. The expected result is Negative.  Fact Sheet for Patients:  EntrepreneurPulse.com.au  Fact Sheet for Healthcare Providers:  IncredibleEmployment.be  This test is no t yet approved or cleared by the [place] FDA and  has been authorized for detection and/or diagnosis of SARS-CoV-2 by FDA under an Emergency  Use Authorization (EUA). This EUA will remain  in effect (meaning this test can be used) for the duration of the COVID-19 declaration under Section 564(b)(1) of the Act,  21 U.S.C.section 360bbb-3(b)(1), unless the authorization is terminated  or revoked sooner.       Influenza A by PCR NEGATIVE NEGATIVE Final   Influenza B by PCR NEGATIVE NEGATIVE Final    Comment: (NOTE) The Xpert Xpress SARS-CoV-2/FLU/RSV plus assay is intended as an aid in the diagnosis of influenza from Nasopharyngeal swab specimens and should not be used as a sole basis for treatment. Nasal washings and aspirates are unacceptable for Xpert Xpress SARS-CoV-2/FLU/RSV testing.  Fact Sheet for Patients: EntrepreneurPulse.com.au  Fact Sheet for Healthcare Providers: IncredibleEmployment.be  This test is not yet approved or cleared by the [place] FDA and has been authorized for detection and/or diagnosis of SARS-CoV-2 by FDA under an Emergency Use Authorization (EUA). This EUA will remain in effect (meaning this test can be used) for the duration of the COVID-19 declaration under Section 564(b)(1) of the Act, 21 U.S.C. section 360bbb-3(b)(1), unless the authorization is terminated or revoked.  Performed at [facility], [address], [place], [place] [address]           IMAGING    No results found.   Nutrition Status:           Central Line/ continued, requirement due to  Reason to continue [employer] of central venous pressure or other hemodynamic parameters and poor IV access      ASSESSMENT AND PLAN SYNOPSIS  60 yo white male with multiple medical issues with severe and acute systolic CHF exacerbation with shock with progressive cardiorenal syndrome  ACUTE SYSTOLIC CARDIAC FAILURE- EF <20 % -oxygen as needed -Lasix as tolerated -follow up cardiac enzymes as indicated -follow up cardiology recs admitted to ICU for placement of CVL and milrinone infusion Prognosis is extremely poor  Morbid obesity, possible OSA.   Will certainly impact respiratory mechanics  ACUTE KIDNEY  INJURY/Renal Failure -continue Foley Catheter-assess need -Avoid nephrotoxic agents -Follow urine output, BMP -Ensure adequate renal perfusion, optimize oxygenation -Renal dose medications   SHOCK-CARDIOGENIC -use vasopressors to keep MAP>65  CARDIAC ICU monitoring  ID -continue IV abx as prescibed -follow up cultures  GI GI PROPHYLAXIS as indicated  NUTRITIONAL STATUS Nutrition Status:         DIET-->as tolerated Constipation protocol as indicated  ENDO - will use ICU hypoglycemic\Hyperglycemia protocol if indicated     ELECTROLYTES -follow labs as needed -replace as needed -pharmacy consultation and following   DVT/GI PRX ordered and assessed TRANSFUSIONS AS NEEDED MONITOR FSBS I Assessed the need for Labs I Assessed the need for Foley I Assessed the need for Central Venous Line Family Discussion when available I Assessed the need for Mobilization I made an Assessment of medications to be adjusted accordingly Safety Risk assessment completed   CASE DISCUSSED IN MULTIDISCIPLINARY ROUNDS WITH ICU TEAM  prognosis is guarded.    Recommend Palliative care for assessment for Hospice   Loletha Bertini Patricia Pesa, M.D.  [facility]  Medical Director [place] Director [facility] Cardio-Pulmonary Department

## 2020-06-21 NOTE — Progress Notes (Signed)
Met family of this patient who had been called in for doctor consult. Family has been advised of patient condition. They are choosing to talk it out with extended family to make a final decision. Assisted where possible, getting drinks,etc.

## 2020-06-21 NOTE — Progress Notes (Signed)
Progress Note  Patient Name: [name] [name] Date of Encounter: 06/21/2020  [facility] Cardiologist: Ida Rogue, MD   Subjective   Documented -7.2L overnight. Weight unchanged from yesterday. Renal function improving. Remains on lasix gtt 8mg /hr. On milrinone, it was decreased to 0.138mcg/kg/min. Patient feels better today, says breathing is improved. Still has some upper leg/sacral edema on exam. Afib rates suboptimally controlled. Central venous O2 saturation 80%.   Inpatient Medications    Scheduled Meds: . atorvastatin  80 mg Oral Daily  . Chlorhexidine Gluconate Cloth  6 each Topical Daily  . DULoxetine  30 mg Oral Daily  . ezetimibe  10 mg Oral Daily  . gabapentin  400 mg Oral QID  . insulin aspart  0-20 Units Subcutaneous Q4H  . methylPREDNISolone (SOLU-MEDROL) injection  40 mg Intravenous Daily  . midodrine  5 mg Oral TID WC  . mometasone-formoterol  2 puff Inhalation BID  . multivitamin with minerals  1 tablet Oral Daily  . pantoprazole  40 mg Oral Daily  . sodium chloride flush  3 mL Intravenous Q12H  . spironolactone  12.5 mg Oral Daily  . tamsulosin  0.4 mg Oral QPC supper  . traZODone  150 mg Oral QHS  . umeclidinium bromide  1 puff Inhalation Daily  . Vericiguat  5 mg Oral Daily   Continuous Infusions: . sodium chloride    . furosemide (LASIX) 200 mg in dextrose 5% 100 mL (2mg /mL) infusion 8 mg/hr (06/21/20 0600)  . milrinone 0.125 mcg/kg/min (06/21/20 0600)   PRN Meds: sodium chloride, acetaminophen, Ipratropium-Albuterol, ipratropium-albuterol, nitroGLYCERIN, ondansetron (ZOFRAN) IV, oxyCODONE, sodium chloride flush   Vital Signs    Vitals:   06/21/20 0300 06/21/20 0400 06/21/20 0500 06/21/20 0600  BP: 108/80 108/70 117/78 108/74  Pulse: 92 99 (!) 110 (!) 102  Resp: 13 13 (!) 23 15  Temp:      TempSrc:      SpO2: 95% 97% 95% 94%  Weight:   102.8 kg   Height:        Intake/Output Summary (Last 24 hours) at 06/21/2020 0847 Last data filed  at 06/21/2020 0828 Gross per 24 hour  Intake 456.99 ml  Output 8625 ml  Net -8168.01 ml   Last 3 Weights 06/21/2020 06/20/2020 06/19/2020  Weight (lbs) 226 lb 10.1 oz 226 lb 6.6 oz 229 lb 8 oz  Weight (kg) 102.8 kg 102.7 kg 104.1 kg  Some encounter information is confidential and restricted. Go to Review Flowsheets activity to see all data.      Telemetry    Afib HR around 100, PVCs - Personally Reviewed  ECG    No new - Personally Reviewed  Physical Exam   GEN: No acute distress.   Neck: + JVD Cardiac: Irreg Irreg, no murmurs, rubs, or gallops.  Respiratory: course breath sounds bilaterally. GI: Soft, nontender, non-distended  MS: leg edema; No deformity. Neuro:  Nonfocal  Psych: Normal affect   Labs    High Sensitivity Troponin:   Recent Labs  Lab 06/17/20 0049 06/17/20 0420  TROPONINIHS 39* 54*      Chemistry Recent Labs  Lab 06/17/20 0130 06/18/20 0412 06/19/20 0347 06/20/20 0539 06/21/20 0539  NA 125*   < > 127* 129* 134*  K 5.2*   < > 5.0 4.2 3.8  CL 90*   < > 92* 89* 90*  CO2 25   < > 26 29 34*  GLUCOSE 124*   < > 134* 124* 124*  BUN 34*   < >  48* 49* 40*  CREATININE 1.50*   < > 1.57* 1.47* 1.15  CALCIUM 9.1   < > 9.1 9.0 9.0  PROT 7.0  --   --   --   --   ALBUMIN 3.8  --   --   --   --   AST 81*  --   --   --   --   ALT 37  --   --   --   --   ALKPHOS 114  --   --   --   --   BILITOT 3.7*  --   --   --   --   GFRNONAA 54*   < > 51* 55* >60  ANIONGAP 10   < > 9 11 10    < > = values in this interval not displayed.     Hematology Recent Labs  Lab 06/17/20 0049 06/19/20 0347 06/21/20 0539  WBC 8.3 7.1 5.7  RBC 4.88 4.17* 4.23  HGB 13.1 11.3* 11.3*  HCT 40.3 33.2* 34.2*  MCV 82.6 79.6* 80.9  MCH 26.8 27.1 26.7  MCHC 32.5 34.0 33.0  RDW 21.9* 21.5* 21.4*  PLT 184 131* 123*    BNP Recent Labs  Lab 06/14/20 1413 06/17/20 0049  BNP 2,062.1* 1,925.5*     DDimer No results for input(s): DDIMER in the last 168 hours.   Radiology     No results found.  Cardiac Studies    2D echo 06/17/2020: 1. Left ventricular ejection fraction, by estimation, is <20%. Left  ventricular ejection fraction by PLAX is 14 %. The left ventricle has  severely decreased function. The left ventricle demonstrates global  hypokinesis. The left ventricular internal  cavity size was severely dilated. Left ventricular diastolic parameters  are indeterminate.  2. Right ventricular systolic function is severely reduced. The right  ventricular size is moderately enlarged. There is severely elevated  pulmonary artery systolic pressure. The estimated right ventricular  systolic pressure is 65.4 mmHg.  3. Left atrial size was severely dilated.  4. Right atrial size was severely dilated.  5. Tricuspid valve regurgitation is mild to moderate.  6. The inferior vena cava is dilated in size with <50% respiratory  variability, suggesting right atrial pressure of 15 mmHg. __________  01/2020:  Prox RCA-1 lesion is 80% stenosed.  Prox RCA-2 lesion is 20% stenosed.  Prox RCA to Mid RCA lesion is 80% stenosed.  Prox LAD lesion is 20% stenosed.  1st Diag lesion is 80% stenosed.  Mid LAD lesion is 20% stenosed.  Dist LAD lesion is 60% stenosed.  Prox Cx to Mid Cx lesion is 90% stenosed.  LPAV lesion is 95% stenosed.  1. Significant underlying three-vessel coronary artery disease. Patent proximal LAD stent with mild in-stent restenosis. Moderate diffuse disease in the distal LAD. Significant stenosis in the proximal left circumflex at the origin of the posterior AV groove artery which is also heavily diseased at the ostium. This is a bifurcation lesion and heavily calcified. RCA stent is patent. However, there is significant proximal disease in the whole mid to distal segment is diffusely diseased and small caliber. 2. Left ventricular angiography was not performed. EF was severe reduced by echo. 3. Moderately elevated left ventricular  end-diastolic pressure at 28 mmHg  Recommendations: The patient's cardiomyopathy seems to be out of proportion to his coronary artery disease as the LAD itself does not seem to have obstructive disease at the present time. The RCA is diffusely diseased and too small  to stent. The only target for revascularization would be the left circumflex but it is a calcified bifurcation lesion and likely requires atherectomy. In order to do this safely, the patient has to be optimized from a heart failure standpoint as he appears to be volume overloaded. Recommend intravenous diuresis. If limited by hypotension, we might need to consider inotropic support and small dose norepinephrine drip initially. I added small dose digoxin. Treat underlying atrial fibrillation. Resume heparin drip 8 hours after sheath pull. __________  2D echo 01/2020: 1. Left ventricular ejection fraction, by estimation, is <20%. The left  ventricle has severely decreased function. The left ventricle demonstrates  global hypokinesis. The left ventricular internal cavity size was  moderately dilated. Left ventricular  diastolic parameters are indeterminate.  2. Right ventricular systolic function is moderately reduced. The right  ventricular size is moderately enlarged. There is normal pulmonary artery  systolic pressure. The estimated right ventricular systolic pressure is  88.5 mmHg.  3. Left atrial size was mildly dilated.  4. Mild mitral valve regurgitation.  5. Tricuspid valve regurgitation is moderate. __________  2D echo 09/2019: 1. Left ventricular ejection fraction, by estimation, is 20 to 25%. The  left ventricle has severely decreased function. The left ventricle  demonstrates global hypokinesis. The left ventricular internal cavity size  was severely dilated. There is mild  left ventricular hypertrophy. Indeterminate diastolic filling due to E-A  fusion.  2. Right ventricular systolic function is moderately  reduced. The right  ventricular size is mildly enlarged. There is moderately elevated  pulmonary artery systolic pressure. The estimated right ventricular  systolic pressure is 02.7 mmHg.  3. Left atrial size was mildly dilated.  4. Right atrial size was moderately dilated.  5. The mitral valve is degenerative. Mild mitral valve regurgitation. No  evidence of mitral stenosis.  6. Tricuspid valve regurgitation is mild to moderate.  7. The aortic valve was not well visualized. Aortic valve regurgitation  is not visualized. Mild aortic valve sclerosis is present, with no  evidence of aortic valve stenosis.  8. The inferior vena cava is dilated in size with <50% respiratory  variability, suggesting right atrial pressure of 15 mmHg. __________  2D Echo 10/2018: 1. The left ventricle has severely reduced systolic function, with an ejection fraction of 20-25%. The cavity size was moderately dilated. Indeterminate diastolic filling due to E-A fusion. Left ventricular diffuse hypokinesis. 2. The right ventricle has severely reduced systolic function. The cavity was mildly enlarged. There is no increase in right ventricular wall thickness. Right ventricular systolic pressure is moderately elevated with an estimated pressure of 59.6 mmHg. 3. Left atrial size was mildly dilated. 4. Right atrial size was mildly dilated. 5. The mitral valve is degenerative. Mild thickening of the mitral valve leaflet. Mild calcification of the mitral valve leaflet. There is mild mitral annular calcification present. 6. The tricuspid valve is degenerative. Tricuspid valve regurgitation is moderate. 7. The aortic valve was not well visualized. Moderate thickening of the aortic valve. Moderate calcification of the aortic valve. 8. The inferior vena cava was dilated in size with <50% respiratory variability. __________  LHC 04/2017:  Prox RCA-1 lesion is 70% stenosed.  Prox RCA-2 lesion is 20%  stenosed.  Mid RCA lesion is 80% stenosed.  Mid RCA to Dist RCA lesion is 70% stenosed.  Ost LAD to Prox LAD lesion is 30% stenosed.  Ost 1st Diag to 1st Diag lesion is 80% stenosed.  Prox LAD to Mid LAD lesion is 60% stenosed.  Dist LAD lesion is 60% stenosed.  Prox Cx to Mid Cx lesion is 90% stenosed.  Ost 1st Mrg to 1st Mrg lesion is 85% stenosed.  1. Significant three-vessel coronary artery disease with patent stent in the RCA and LAD. There is significant proximal RCA disease before the stent as well as diffuse mid and distal disease in a vessel that appears to be about 2.5 mm in diameter. There is significant bifurcation stenosis in the proximal left circumflex with a large OM1. The LAD has moderate disease. The coronary arteries are moderately calcified and diffusely diseased throughout. 2. Left ventricular angiography was not performed. EF was moderately to severely reduced by echo. 3. Severely elevated left ventricular end-diastolic pressure at 34 mmHg.  Recommendations: This is overall a difficult situation. Revascularization options are somewhat limited due to diffuse disease overall. Given that he is diabetic and has cardiomyopathy, best option might be CABG if he is found to be a candidate. We need to optimize his heart failure first with outpatient referral to cardiothoracic surgery for evaluation.  I am going to increase intravenous diuresis today and the patient possibly can be discharged home tomorrow on medical therapy.   Patient Profile     60 y.o. 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  Assessment & Plan    BiV failure/ICM/pulmonary HTN - Hx of noncompliance - On lasix drip 8mg /hr - continue milrinone and midodrine. BP this AM - Recorded -7.2L UOP overnight, net -12.9L - still volume up on exam - losartan held for soft pressures. - spironolactone 12.5mg  daily - Soft pressures limiting medication titration/GDMT - creatinine improving - Now DNR, prognosis is poor and palliative care following  Elevated troponin with known CAD - patient previously declined CABG at [facility] 01/2020 showed no great targets for PCI with consideration for atherectomy of LCx but patietn left AMA - HS trop minimally elevated at 54, not consistent with ACS - continue plavix.  - No ASA with Eliquis - no plans for ischemic evaluation  Persistent Afib - Remains in Afib with suboptimally controlled rates - History of noncompliance with Eliquis. continue Eliquis - CHADSVASC 4 - Not a good candidate for rhythm control given severely dilated LA  AKI - creatinine improving, 1.47>1.15  HTN - Bps soft requiting midodrine and milrinone. Improving  HLD - LDL 77 01/2020 - continue lipitor and zetia   For questions or updates, please contact [place] Please consult [email or web address] for contact info under        Signed, Hadlie Gipson Ninfa Meeker, PA-C  06/21/2020, 8:47 AM

## 2020-06-22 DIAGNOSIS — N179 Acute kidney failure, unspecified: Secondary | ICD-10-CM

## 2020-06-22 DIAGNOSIS — E871 Hypo-osmolality and hyponatremia: Secondary | ICD-10-CM | POA: Diagnosis not present

## 2020-06-22 DIAGNOSIS — I130 Hypertensive heart and chronic kidney disease with heart failure and stage 1 through stage 4 chronic kidney disease, or unspecified chronic kidney disease: Secondary | ICD-10-CM | POA: Diagnosis not present

## 2020-06-22 DIAGNOSIS — J810 Acute pulmonary edema: Secondary | ICD-10-CM | POA: Diagnosis not present

## 2020-06-22 DIAGNOSIS — I5023 Acute on chronic systolic (congestive) heart failure: Secondary | ICD-10-CM | POA: Diagnosis not present

## 2020-06-22 LAB — CBC WITH DIFFERENTIAL/PLATELET
Abs Immature Granulocytes: 0.04 10*3/uL (ref 0.00–0.07)
Basophils Absolute: 0 10*3/uL (ref 0.0–0.1)
Basophils Relative: 0 %
Eosinophils Absolute: 0 10*3/uL (ref 0.0–0.5)
Eosinophils Relative: 0 %
HCT: 36.2 % — ABNORMAL LOW (ref 39.0–52.0)
Hemoglobin: 11.8 g/dL — ABNORMAL LOW (ref 13.0–17.0)
Immature Granulocytes: 1 %
Lymphocytes Relative: 12 %
Lymphs Abs: 0.8 10*3/uL (ref 0.7–4.0)
MCH: 26.7 pg (ref 26.0–34.0)
MCHC: 32.6 g/dL (ref 30.0–36.0)
MCV: 81.9 fL (ref 80.0–100.0)
Monocytes Absolute: 0.6 10*3/uL (ref 0.1–1.0)
Monocytes Relative: 9 %
Neutro Abs: 5.1 10*3/uL (ref 1.7–7.7)
Neutrophils Relative %: 78 %
Platelets: 126 10*3/uL — ABNORMAL LOW (ref 150–400)
RBC: 4.42 MIL/uL (ref 4.22–5.81)
RDW: 21.7 % — ABNORMAL HIGH (ref 11.5–15.5)
Smear Review: NORMAL
WBC: 6.5 10*3/uL (ref 4.0–10.5)
nRBC: 0 % (ref 0.0–0.2)

## 2020-06-22 LAB — PHOSPHORUS: Phosphorus: 3 mg/dL (ref 2.5–4.6)

## 2020-06-22 LAB — APTT
aPTT: 26 seconds (ref 24–36)
aPTT: 83 seconds — ABNORMAL HIGH (ref 24–36)

## 2020-06-22 LAB — BASIC METABOLIC PANEL
Anion gap: 8 (ref 5–15)
BUN: 35 mg/dL — ABNORMAL HIGH (ref 6–20)
CO2: 39 mmol/L — ABNORMAL HIGH (ref 22–32)
Calcium: 9.1 mg/dL (ref 8.9–10.3)
Chloride: 89 mmol/L — ABNORMAL LOW (ref 98–111)
Creatinine, Ser: 1.11 mg/dL (ref 0.61–1.24)
GFR, Estimated: >60 mL/min (ref >60)
Glucose, Bld: 100 mg/dL — ABNORMAL HIGH (ref 70–99)
Potassium: 3.7 mmol/L (ref 3.5–5.1)
Sodium: 136 mmol/L (ref 135–145)

## 2020-06-22 LAB — MAGNESIUM: Magnesium: 1.9 mg/dL (ref 1.7–2.4)

## 2020-06-22 LAB — GLUCOSE, CAPILLARY
Glucose-Capillary: 150 mg/dL — ABNORMAL HIGH (ref 70–99)
Glucose-Capillary: 155 mg/dL — ABNORMAL HIGH (ref 70–99)
Glucose-Capillary: 163 mg/dL — ABNORMAL HIGH (ref 70–99)
Glucose-Capillary: 176 mg/dL — ABNORMAL HIGH (ref 70–99)
Glucose-Capillary: 200 mg/dL — ABNORMAL HIGH (ref 70–99)
Glucose-Capillary: 207 mg/dL — ABNORMAL HIGH (ref 70–99)
Glucose-Capillary: 227 mg/dL — ABNORMAL HIGH (ref 70–99)
Glucose-Capillary: 88 mg/dL (ref 70–99)

## 2020-06-22 LAB — HEPARIN LEVEL (UNFRACTIONATED): Heparin Unfractionated: 0.4 IU/mL (ref 0.30–0.70)

## 2020-06-22 LAB — PROTIME-INR
INR: 1.3 — ABNORMAL HIGH (ref 0.8–1.2)
Prothrombin Time: 15.3 seconds — ABNORMAL HIGH (ref 11.4–15.2)

## 2020-06-22 MED ORDER — HEPARIN (PORCINE) 25000 UT/250ML-% IV SOLN
1250.0000 [IU]/h | INTRAVENOUS | Status: DC
  Administered 2020-06-22 – 2020-06-23 (×2): 1250 [IU]/h via INTRAVENOUS
  Filled 2020-06-22 (×4): qty 250

## 2020-06-22 MED ORDER — HEPARIN BOLUS VIA INFUSION
5000.0000 [IU] | Freq: Once | INTRAVENOUS | Status: AC
  Administered 2020-06-22: 5000 [IU] via INTRAVENOUS
  Filled 2020-06-22: qty 5000

## 2020-06-22 NOTE — Progress Notes (Signed)
CRITICAL CARE NOTE 60 y.o.malewith a hx of ICM EF 20-25%, CAD/PCI, tobacco abuse, COPD, PAD s/p L SFA stenting, s/p R BKA, afib on Eliquis, OSA noncompliant with CPAP, hx of leaving AMA and noncompliancewho is being seen today for the evaluation ofheart failure.presents to the emergency room with shortness of breath, fluid retention in his legs and intermittent chest pain. He uses albuterol which helped a bit but continues to have dyspnea with exertion. He denies cough, fever or chills  Cardiology Consulted recommend ICU admission for inotrope Nephrology consulted for progressive cardiorenal syndrome-placed on lasix infusion   EVENTS/Hospital Course 1/9 admission to ICU for severe heart failure 1/9 CVL placed 1/10 on milrinone infusion/lasic infusion 1/11 severe end stage CHF, prognosis is poor 1/12 stopped eliquis and plavix  CC  Follow up CHF exacerbation  SUBJECTIVE Alert and awake On minimal oxygen  On milrinone infusion lasix infusion  BP (!) 112/93   Pulse (!) 38   Temp 97.9 F (36.6 C) (Oral)   Resp 17   Ht 5\' 7"  (1.702 m)   Wt 102.8 kg   SpO2 94%   BMI 35.50 kg/m    I/O last 3 completed shifts: In: 1641.1 [P.O.:1440; I.V.:201.1] Out: 9147 [Urine:7475] No intake/output data recorded.  SpO2: 94 % O2 Flow Rate (L/min): 2 L/min  Estimated body mass index is 35.5 kg/m as calculated from the following:   Height as of this encounter: 5\' 7"  (1.702 m).   Weight as of this encounter: 102.8 kg.   Review of Systems:  Gen:  fatigued HEENT: Denies blurred vision, double vision, ear pain, eye pain, hearing loss, nose bleeds, sore throat Cardiac:  No dizziness, chest pain or heaviness, chest tightness,edema, No JVD Resp:   +SOB Other:  All other systems negative   Physical Examination:   General Appearance: No distress  Neuro:without focal findings,  speech normal,  HEENT: PERRLA, EOM intact.   Pulmonary: normal breath sounds, No wheezing.   CardiovascularNormal S1,S2.  No m/r/g.    ALL OTHER ROS ARE NEGATIVE   MEDICATIONS: I have reviewed all medications and confirmed regimen as documented   CULTURE RESULTS   Recent Results (from the past 240 hour(s))  Resp Panel by RT-PCR (Flu A&B, Covid)     Status: None   Collection Time: 06/17/20  6:00 AM  Result Value Ref Range Status   SARS Coronavirus 2 by RT PCR NEGATIVE NEGATIVE Final    Comment: (NOTE) SARS-CoV-2 target nucleic acids are NOT DETECTED.  The SARS-CoV-2 RNA is generally detectable in upper respiratory specimens during the acute phase of infection. The lowest concentration of SARS-CoV-2 viral copies this assay can detect is 138 copies/mL. A negative result does not preclude SARS-Cov-2 infection and should not be used as the sole basis for treatment or other patient management decisions. A negative result may occur with  improper specimen collection/handling, submission of specimen other than nasopharyngeal swab, presence of viral mutation(s) within the areas targeted by this assay, and inadequate number of viral copies(<138 copies/mL). A negative result must be combined with clinical observations, patient history, and epidemiological information. The expected result is Negative.  Fact Sheet for Patients:  EntrepreneurPulse.com.au  Fact Sheet for Healthcare Providers:  IncredibleEmployment.be  This test is no t yet approved or cleared by the [place] FDA and  has been authorized for detection and/or diagnosis of SARS-CoV-2 by FDA under an Emergency Use Authorization (EUA). This EUA will remain  in effect (meaning this test can be used) for the duration  of the COVID-19 declaration under Section 564(b)(1) of the Act, 21 U.S.C.section 360bbb-3(b)(1), unless the authorization is terminated  or revoked sooner.       Influenza A by PCR NEGATIVE NEGATIVE Final   Influenza B by PCR NEGATIVE NEGATIVE Final     Comment: (NOTE) The Xpert Xpress SARS-CoV-2/FLU/RSV plus assay is intended as an aid in the diagnosis of influenza from Nasopharyngeal swab specimens and should not be used as a sole basis for treatment. Nasal washings and aspirates are unacceptable for Xpert Xpress SARS-CoV-2/FLU/RSV testing.  Fact Sheet for Patients: EntrepreneurPulse.com.au  Fact Sheet for Healthcare Providers: IncredibleEmployment.be  This test is not yet approved or cleared by the [place] FDA and has been authorized for detection and/or diagnosis of SARS-CoV-2 by FDA under an Emergency Use Authorization (EUA). This EUA will remain in effect (meaning this test can be used) for the duration of the COVID-19 declaration under Section 564(b)(1) of the Act, 21 U.S.C. section 360bbb-3(b)(1), unless the authorization is terminated or revoked.  Performed at [facility], [address] [place], [place] [address]           Central Line/ continued, requirement due to  Reason to continue [employer] of central venous pressure or other hemodynamic parameters and poor IV access      ASSESSMENT AND PLAN SYNOPSIS  60 yo white male with multiple medical issues with severe and acute systolic CHF exacerbation with shock with progressive cardiorenal syndrome  ACUTE SYSTOLIC CARDIAC FAILURE- EF <20 % admitted to ICU for placement of CVL and milrinone infusion Prognosis is extremely poor  Morbid obesity, possible OSA.   Will certainly impact respiratory mechanics  ACUTE KIDNEY INJURY/Renal Failure -continue Foley Catheter-assess need -Avoid nephrotoxic agents -Follow urine output, BMP -Ensure adequate renal perfusion, optimize oxygenation -Renal dose medications   SHOCK-CARDIOGENIC -use vasopressors to keep MAP>65  DIET-->as tolerated Constipation protocol as indicated  ELECTROLYTES -follow labs as needed -replace as needed -pharmacy  consultation and following   DVT/GI PRX ordered and assessed TRANSFUSIONS AS NEEDED MONITOR FSBS I Assessed the need for Labs I Assessed the need for Foley I Assessed the need for Central Venous Line Family Discussion when available I Assessed the need for Mobilization I made an Assessment of medications to be adjusted accordingly Safety Risk assessment completed  CASE DISCUSSED IN MULTIDISCIPLINARY ROUNDS WITH ICU TEAM  prognosis is guarded.    Recommend Palliative care for assessment for Hospice   Kiandra Sanguinetti Patricia Pesa, M.D.  [facility]  Medical Director [place] Director [facility] Cardio-Pulmonary Department

## 2020-06-22 NOTE — Progress Notes (Addendum)
[place] for heparin Indication: atrial fibrillation  Allergies  Allergen Reactions  . Dulaglutide Anaphylaxis, Diarrhea and Hives  . Other Itching    Skin itching associated with nitro patch  . Prednisone Rash    Patient Measurements: Height: 5\' 7"  (170.2 cm) Weight: 102.8 kg (226 lb 10.1 oz) IBW/kg (Calculated) : 66.1 Heparin Dosing Weight: 88 kg  Vital Signs: Temp: 98 F (36.7 C) (01/13 2000) Temp Source: Oral (01/13 2000) BP: 123/81 (01/13 2000) Pulse Rate: 106 (01/13 2200)  Labs: Recent Labs    06/20/20 0539 06/21/20 0539 06/22/20 0500 06/22/20 1241 06/22/20 2128  HGB  --  11.3* 11.8*  --   --   HCT  --  34.2* 36.2*  --   --   PLT  --  123* 126*  --   --   APTT  --   --   --  26 83*  LABPROT  --   --   --  15.3*  --   INR  --   --   --  1.3*  --   HEPARINUNFRC  --   --   --  0.40  --   CREATININE 1.47* 1.15 1.11  --   --     Estimated Creatinine Clearance: 83.9 mL/min (by C-G formula based on SCr of 1.11 mg/dL).   Medical History: Past Medical History:  Diagnosis Date  . Arthritis   . Asthma   . Atherosclerosis   . BPH (benign prostatic hyperplasia)   . Carotid arterial disease ([place])   . Charcot's joint of foot, right   . Chronic combined systolic (congestive) and diastolic (congestive) heart failure ([place])    a. 02/2013 EF 50% by LV gram; b. 04/2017 Echo: EF 25-30%. diff HK. Gr2 DD. Mod dil LA/RV. PASP 60mmHg.  [name] COPD (chronic obstructive pulmonary disease) ([place])   . Coronary artery disease    a. 2013 S/P PCI of LAD [facility]);  b. 03/2013 PCI: RCA 90p ( 2.5 x 23 mm DES); c. 02/2014 Cath: patent RCA stent->Med Rx; d. 04/2017 Cath: LM nl, LAd 30ost/p, 53m/d, D1 80ost, LCX 90p/m, OM1 85, RCA 70/20p, 9m, 70d->Referred for CT Surg-felt to be poor candidate.  . Diabetic neuropathy ([place])   . Diabetic ulcer of right foot ([place])   . Hernia   . Hyperlipidemia   . Hypertension   . Ischemic cardiomyopathy    a. 04/2017 Echo:  EF 25-30%; b. 08/2017 Cardiac MRI ([facility]): EF 19%, sev glob HK. RVEF 20%, mod BAE, triv MR, mild-mod TR. Basal lateral subendocardial infarct (viable), inf/infsept, dist septal ischemia, basal to mid lat peri-infarct ischemia.  . Morbid obesity ([place])   . Neuropathy   . PAD (peripheral artery disease) ([place])    a. Followed by Dr. Lucky Cowboy; b. 08/2010 Periph Angio: RSFA 70-80p (6X60 self-expanding stent); c. 09/2010 Periph Angio: L SFA 100p (7x unknown length self-expanding stent); d. 06/2015 Periph Angio: R SFA short segment occlusion (6x12 self-expanding stent).  . Restless leg syndrome   . Sleep apnea   . Subclavian artery stenosis, right ([place])   . Syncope and collapse   . Tobacco use    a. 75+ yr hx - still smoking 1ppd, down from 2 ppd.  . Type II diabetes mellitus ([place])   . Varicose vein      Assessment: 60 year old male with HFrEF, EF < 20% in the ICU for furosemide and milrinone drips. Patient also has h/o afib on Eliquis, which was held  for some neck pain/bruising at the site of his central line. This has improved. Plan for heparin drip per Cardiology.   Goal of Therapy:  HL 0.3 - 0.7 APTT 66 - 102 sec Monitor platelets by anticoagulation protocol: Yes   Plan:  1/13: HL @ 1241 (baseline) = 0.4, aPTT @ 1241 = 26  1/13: aPTT @ 2128 = 83  Will continue pt on current rate and draw confirmation aPTT on 1/14 @ 0330. Will recheck HL on 1/14 @ 0330.  Will use aPTT until HL and aPTT are therapeutic.    Maebell Lyvers D, PharmD 06/22/2020,10:19 PM

## 2020-06-22 NOTE — Progress Notes (Signed)
[place] visited pt. as follow-up from prior attempts this week to complete AD; pt. amenable to AD notarization today --> [facility] and [facility] [place] returned w/witnesses and notary --> document notarized.  Pt. names [name] [name] [phone]) as MPOA; Pt.declines to complete Living Will at this time.  Copy placed in chart and scanned to [place].

## 2020-06-22 NOTE — Progress Notes (Signed)
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   I met today at [name] [name] bedside. No family present. He is in good spirits. He is watching television but mutes when I come to bedside. He tells me that he can tell a significant improvement in his breathing and feels that the interventions are working and making him feel better. I acknowledge that this is good motivation to stay here with [place] so we can continue to get him feeling as good as possible because I know how much he hates being in the hospital. He misses his home, significant other, and his dogs. He is hopeful to return back home.   We did discuss that it is taking so long to help him to improve because his heart is so weak to begin with. We did discuss that he can continue to think about his wishes and what he wants in the future. We discussed that there is a chance he help him to improve some now and this could happen again in another week or month. He needs to really think about how he feels if he needs to do this all over again or if he ever gets to a point he wants to focus on spending his time at home he would have the option of having hospice support at home. I allowed him a moment for him to process this option. I reassured him that this is based on his wishes and what is most important to him. He verbalizes understanding.   All questions/concerns addressed.  Emotional support provided.   Exam: Alert, oriented. No distress. Good spirits. Breathing regular, unlabored on 2L nasal cannula. Abd flat.   Plan: - Working to complete HCPOA today.  - Outpatient palliative following.   25 min  Vinie Sill, NP Palliative Medicine Team Pager [phone] (Please see [email or web address] for schedule) Team Phone [phone]    Greater than 50%  of this time was spent counseling and coordinating care related to the above assessment and plan

## 2020-06-22 NOTE — Progress Notes (Signed)
Progress Note  Patient Name: [name] [name] Date of Encounter: 06/22/2020  [facility] Cardiologist: Ida Rogue, MD   Subjective   Resting comfortably, making good urine Still with significant abdominal swelling, flank edema, shortness of breath Tremendously deconditioned, worse in the past several weeks to months Some muscle atrophy upper body -7 L yesterday -2.9 L day before Responding well to Lasix infusion it would appear Inpatient Medications    Scheduled Meds: . atorvastatin  80 mg Oral Daily  . Chlorhexidine Gluconate Cloth  6 each Topical Daily  . DULoxetine  30 mg Oral Daily  . ezetimibe  10 mg Oral Daily  . gabapentin  400 mg Oral QID  . insulin aspart  0-20 Units Subcutaneous Q4H  . methylPREDNISolone (SOLU-MEDROL) injection  40 mg Intravenous Daily  . midodrine  5 mg Oral TID WC  . mometasone-formoterol  2 puff Inhalation BID  . multivitamin with minerals  1 tablet Oral Daily  . pantoprazole  40 mg Oral Daily  . sodium chloride flush  3 mL Intravenous Q12H  . spironolactone  12.5 mg Oral Daily  . tamsulosin  0.4 mg Oral QPC supper  . traZODone  150 mg Oral QHS  . umeclidinium bromide  1 puff Inhalation Daily  . Vericiguat  5 mg Oral Daily   Continuous Infusions: . sodium chloride    . furosemide (LASIX) 200 mg in dextrose 5% 100 mL (2mg /mL) infusion 6 mg/hr (06/22/20 0600)  . heparin 1,250 Units/hr (06/22/20 1326)  . milrinone 0.125 mcg/kg/min (06/22/20 0600)   PRN Meds: sodium chloride, acetaminophen, Ipratropium-Albuterol, ipratropium-albuterol, nitroGLYCERIN, ondansetron (ZOFRAN) IV, oxyCODONE, sodium chloride flush   Vital Signs    Vitals:   06/22/20 1100 06/22/20 1200 06/22/20 1300 06/22/20 1400  BP: 98/67 117/86 115/81 115/68  Pulse: (!) 119 (!) 116 (!) 107 (!) 113  Resp: 15 18 18 17   Temp:  98.5 F (36.9 C)    TempSrc:  Oral    SpO2: 96% 99% 96% 97%  Weight:      Height:        Intake/Output Summary (Last 24 hours) at 06/22/2020  1535 Last data filed at 06/22/2020 1119 Gross per 24 hour  Intake 1334.79 ml  Output 2675 ml  Net -1340.21 ml   Last 3 Weights 06/21/2020 06/20/2020 06/19/2020  Weight (lbs) 226 lb 10.1 oz 226 lb 6.6 oz 229 lb 8 oz  Weight (kg) 102.8 kg 102.7 kg 104.1 kg  Some encounter information is confidential and restricted. Go to Review Flowsheets activity to see all data.      Telemetry    Atrial fibrillation- Personally Reviewed  ECG     - Personally Reviewed  Physical Exam   Constitutional: Arousable, somewhat lethargic HENT:  Head: Grossly normal Eyes:  no discharge. No scleral icterus.  Neck: JVD 10+ no carotid bruits  Cardiovascular: Regular rate and rhythm, no murmurs appreciated 1+ pitting edema left lower extremity Flank edema Pulmonary/Chest: Poor inspiratory effort, Rales Abdominal: Distended, edematous, dull to percussion Musculoskeletal: Normal range of motion Neurological:  normal muscle tone. Coordination normal. No atrophy Skin: Skin warm and dry Psychiatric: Lethargic  Labs    High Sensitivity Troponin:   Recent Labs  Lab 06/17/20 0049 06/17/20 0420  TROPONINIHS 39* 54*      Chemistry Recent Labs  Lab 06/17/20 0130 06/18/20 0412 06/20/20 0539 06/21/20 0539 06/22/20 0500  NA 125*   < > 129* 134* 136  K 5.2*   < > 4.2 3.8 3.7  CL  90*   < > 89* 90* 89*  CO2 25   < > 29 34* 39*  GLUCOSE 124*   < > 124* 124* 100*  BUN 34*   < > 49* 40* 35*  CREATININE 1.50*   < > 1.47* 1.15 1.11  CALCIUM 9.1   < > 9.0 9.0 9.1  PROT 7.0  --   --   --   --   ALBUMIN 3.8  --   --   --   --   AST 81*  --   --   --   --   ALT 37  --   --   --   --   ALKPHOS 114  --   --   --   --   BILITOT 3.7*  --   --   --   --   GFRNONAA 54*   < > 55* >60 >60  ANIONGAP 10   < > 11 10 8    < > = values in this interval not displayed.     Hematology Recent Labs  Lab 06/19/20 0347 06/21/20 0539 06/22/20 0500  WBC 7.1 5.7 6.5  RBC 4.17* 4.23 4.42  HGB 11.3* 11.3* 11.8*  HCT  33.2* 34.2* 36.2*  MCV 79.6* 80.9 81.9  MCH 27.1 26.7 26.7  MCHC 34.0 33.0 32.6  RDW 21.5* 21.4* 21.7*  PLT 131* 123* 126*    BNP Recent Labs  Lab 06/17/20 0049  BNP 1,925.5*     DDimer No results for input(s): DDIMER in the last 168 hours.   Radiology    No results found.  Cardiac Studies  Echocardiogram June 17, 2020 1. Left ventricular ejection fraction, by estimation, is <20%. Left  ventricular ejection fraction by PLAX is 14 %. The left ventricle has  severely decreased function. The left ventricle demonstrates global  hypokinesis. The left ventricular internal  cavity size was severely dilated. Left ventricular diastolic parameters  are indeterminate.  2. Right ventricular systolic function is severely reduced. The right  ventricular size is moderately enlarged. There is severely elevated  pulmonary artery systolic pressure. The estimated right ventricular  systolic pressure is 78.4 mmHg.  3. Left atrial size was severely dilated.  4. Right atrial size was severely dilated.  5. Tricuspid valve regurgitation is mild to moderate.  6. The inferior vena cava is dilated in size with <50% respiratory  variability, suggesting right atrial pressure of 15 mmHg.    Patient Profile     [name] [name] is a 60 y.o. 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 and lower extremity ulceration,  PAD s/p prior SFA stenting 03/2013 s/p R BKA,  carotid arterial disease History of leaving the hospital AMA Who presents to the hospital with worsening CHF symptoms  Assessment & Plan    Biventricular heart failure/ mixed ischemic with EF <20%  no options for intervention of his severe coronary disease, not CABG  candidate not percutaneous stenting candidate -Long history noncompliance, leaving hospital AMA, leaving the emergency room AMA -Good urine output on milrinone with Lasix infusion Still massively volume overloaded with abdominal distention, flank edema -We will need to continue likely for quite some time Would recommend we continue palliative discussion  AKI  Cardiorenal syndrome, stable  Chronic hyponatremia -  suspect 2/2 to volume overload On milrinone with Lasix infusion Stable sodium  Elevated troponin with known CAD with prior PCI  - patient declined CABG in the past at [facility] - He had a cath in August showing no great PCI targets btu considered atherectomy of Lcx however patient eventually left AMA -No plan for ischemic work-up at this time  Persistent Afib - hx of noncompliance on Eliquis Discussed with ICU team, heparin infusion could be used  Eliquis Plavix held for line bleeding  HLD  atorvastatin 80mg  daily  DM2 - per IM  Complicated patient, discussed with nursing, ICU team  Total encounter time more than 35 minutes  Greater than 50% was spent in counseling and coordination of care with the patient    For questions or updates, please contact [place] Please consult [email or web address] for contact info under        Signed, Ida Rogue, MD  06/22/2020, 3:35 PM

## 2020-06-22 NOTE — ACP (Advance Care Planning) (Signed)
Pt. names [name] [name] [phone]) as MPOA; Pt.declines to complete Living Will at this time.  Copy placed in chart and scanned to [employer].

## 2020-06-22 NOTE — Progress Notes (Signed)
[place] for heparin Indication: atrial fibrillation  Allergies  Allergen Reactions  . Dulaglutide Anaphylaxis, Diarrhea and Hives  . Other Itching    Skin itching associated with nitro patch  . Prednisone Rash    Patient Measurements: Height: 5\' 7"  (170.2 cm) Weight: 102.8 kg (226 lb 10.1 oz) IBW/kg (Calculated) : 66.1 Heparin Dosing Weight: 88 kg  Vital Signs: Temp: 98.6 F (37 C) (01/13 0800) Temp Source: Axillary (01/13 0800) BP: 98/67 (01/13 1100) Pulse Rate: 119 (01/13 1100)  Labs: Recent Labs    06/20/20 0539 06/21/20 0539 06/22/20 0500  HGB  --  11.3* 11.8*  HCT  --  34.2* 36.2*  PLT  --  123* 126*  CREATININE 1.47* 1.15 1.11    Estimated Creatinine Clearance: 83.9 mL/min (by C-G formula based on SCr of 1.11 mg/dL).   Medical History: Past Medical History:  Diagnosis Date  . Arthritis   . Asthma   . Atherosclerosis   . BPH (benign prostatic hyperplasia)   . Carotid arterial disease ([place])   . Charcot's joint of foot, right   . Chronic combined systolic (congestive) and diastolic (congestive) heart failure ([place])    a. 02/2013 EF 50% by LV gram; b. 04/2017 Echo: EF 25-30%. diff HK. Gr2 DD. Mod dil LA/RV. PASP 2mmHg.  [name] COPD (chronic obstructive pulmonary disease) ([place])   . Coronary artery disease    a. 2013 S/P PCI of LAD [facility]);  b. 03/2013 PCI: RCA 90p ( 2.5 x 23 mm DES); c. 02/2014 Cath: patent RCA stent->Med Rx; d. 04/2017 Cath: LM nl, LAd 30ost/p, 45m/d, D1 80ost, LCX 90p/m, OM1 85, RCA 70/20p, 36m, 70d->Referred for CT Surg-felt to be poor candidate.  . Diabetic neuropathy ([place])   . Diabetic ulcer of right foot ([place])   . Hernia   . Hyperlipidemia   . Hypertension   . Ischemic cardiomyopathy    a. 04/2017 Echo: EF 25-30%; b. 08/2017 Cardiac MRI ([facility]): EF 19%, sev glob HK. RVEF 20%, mod BAE, triv MR, mild-mod TR. Basal lateral subendocardial infarct (viable), inf/infsept, dist septal ischemia, basal to mid lat  peri-infarct ischemia.  . Morbid obesity ([place])   . Neuropathy   . PAD (peripheral artery disease) ([place])    a. Followed by Dr. Lucky Cowboy; b. 08/2010 Periph Angio: RSFA 70-80p (6X60 self-expanding stent); c. 09/2010 Periph Angio: L SFA 100p (7x unknown length self-expanding stent); d. 06/2015 Periph Angio: R SFA short segment occlusion (6x12 self-expanding stent).  . Restless leg syndrome   . Sleep apnea   . Subclavian artery stenosis, right ([place])   . Syncope and collapse   . Tobacco use    a. 75+ yr hx - still smoking 1ppd, down from 2 ppd.  . Type II diabetes mellitus ([place])   . Varicose vein      Assessment: 60 year old male with HFrEF, EF < 20% in the ICU for furosemide and milrinone drips. Patient also has h/o afib on Eliquis, which was held for some neck pain/bruising at the site of his central line. This has improved. Plan for heparin drip per Cardiology.   Goal of Therapy:  Heparin level 0.3-0.7 units/ml Heparin level 66-102 units/ml Monitor platelets by anticoagulation protocol: Yes   Plan:  Heparin 5000 unit bolus followed by heparin drip at 1250 units/hr. Will check aPTT at 1900. Plan to follow aPTT unless baseline HL < 0.10. CBC daily while on heparin drip.  Tawnya Crook, PharmD 06/22/2020,12:09 PM

## 2020-06-23 ENCOUNTER — Encounter (INDEPENDENT_AMBULATORY_CARE_PROVIDER_SITE_OTHER): Payer: Medicare Other

## 2020-06-23 ENCOUNTER — Ambulatory Visit (INDEPENDENT_AMBULATORY_CARE_PROVIDER_SITE_OTHER): Payer: Medicare Other | Admitting: Nurse Practitioner

## 2020-06-23 ENCOUNTER — Ambulatory Visit: Payer: Medicare Other

## 2020-06-23 DIAGNOSIS — I5023 Acute on chronic systolic (congestive) heart failure: Secondary | ICD-10-CM | POA: Diagnosis not present

## 2020-06-23 LAB — GLUCOSE, CAPILLARY
Glucose-Capillary: 140 mg/dL — ABNORMAL HIGH (ref 70–99)
Glucose-Capillary: 152 mg/dL — ABNORMAL HIGH (ref 70–99)
Glucose-Capillary: 96 mg/dL (ref 70–99)
Glucose-Capillary: 171 mg/dL — ABNORMAL HIGH (ref 70–99)
Glucose-Capillary: 242 mg/dL — ABNORMAL HIGH (ref 70–99)
Glucose-Capillary: 202 mg/dL — ABNORMAL HIGH (ref 70–99)
Glucose-Capillary: 160 mg/dL — ABNORMAL HIGH (ref 70–99)

## 2020-06-23 LAB — BASIC METABOLIC PANEL
Anion gap: 8 (ref 5–15)
BUN: 34 mg/dL — ABNORMAL HIGH (ref 6–20)
CO2: 39 mmol/L — ABNORMAL HIGH (ref 22–32)
Calcium: 8.3 mg/dL — ABNORMAL LOW (ref 8.9–10.3)
Chloride: 89 mmol/L — ABNORMAL LOW (ref 98–111)
Creatinine, Ser: 1.11 mg/dL (ref 0.61–1.24)
GFR, Estimated: >60 mL/min (ref >60)
Glucose, Bld: 148 mg/dL — ABNORMAL HIGH (ref 70–99)
Potassium: 3.8 mmol/L (ref 3.5–5.1)
Sodium: 136 mmol/L (ref 135–145)

## 2020-06-23 LAB — PHOSPHORUS
Phosphorus: 2.6 mg/dL (ref 2.5–4.6)
Phosphorus: 2.7 mg/dL (ref 2.5–4.6)

## 2020-06-23 LAB — CBC WITH DIFFERENTIAL/PLATELET
Abs Immature Granulocytes: 0.05 10*3/uL (ref 0.00–0.07)
Basophils Absolute: 0 10*3/uL (ref 0.0–0.1)
Basophils Relative: 0 %
Eosinophils Absolute: 0 10*3/uL (ref 0.0–0.5)
Eosinophils Relative: 0 %
HCT: 36.4 % — ABNORMAL LOW (ref 39.0–52.0)
Hemoglobin: 12.2 g/dL — ABNORMAL LOW (ref 13.0–17.0)
Immature Granulocytes: 1 %
Lymphocytes Relative: 8 %
Lymphs Abs: 0.5 10*3/uL — ABNORMAL LOW (ref 0.7–4.0)
MCH: 27.1 pg (ref 26.0–34.0)
MCHC: 33.5 g/dL (ref 30.0–36.0)
MCV: 80.9 fL (ref 80.0–100.0)
Monocytes Absolute: 0.5 10*3/uL (ref 0.1–1.0)
Monocytes Relative: 8 %
Neutro Abs: 5.2 10*3/uL (ref 1.7–7.7)
Neutrophils Relative %: 83 %
Platelets: 116 10*3/uL — ABNORMAL LOW (ref 150–400)
RBC: 4.5 MIL/uL (ref 4.22–5.81)
RDW: 21.9 % — ABNORMAL HIGH (ref 11.5–15.5)
WBC: 6.3 10*3/uL (ref 4.0–10.5)
nRBC: 0 % (ref 0.0–0.2)

## 2020-06-23 LAB — APTT: aPTT: 84 seconds — ABNORMAL HIGH (ref 24–36)

## 2020-06-23 LAB — MAGNESIUM
Magnesium: 1.8 mg/dL (ref 1.7–2.4)
Magnesium: 2.3 mg/dL (ref 1.7–2.4)

## 2020-06-23 LAB — HEPARIN LEVEL (UNFRACTIONATED): Heparin Unfractionated: 0.58 IU/mL (ref 0.30–0.70)

## 2020-06-23 LAB — POTASSIUM: Potassium: 3.8 mmol/L (ref 3.5–5.1)

## 2020-06-23 MED ORDER — DIGOXIN 125 MCG PO TABS
0.1250 mg | ORAL_TABLET | Freq: Every day | ORAL | Status: DC
  Administered 2020-06-24 – 2020-07-01 (×8): 0.125 mg via ORAL
  Filled 2020-06-23 (×8): qty 1

## 2020-06-23 MED ORDER — MIDODRINE HCL 5 MG PO TABS
10.0000 mg | ORAL_TABLET | Freq: Three times a day (TID) | ORAL | Status: DC
  Administered 2020-06-23 – 2020-06-26 (×9): 10 mg via ORAL
  Filled 2020-06-23 (×9): qty 2

## 2020-06-23 MED ORDER — OXYCODONE HCL 5 MG PO TABS
10.0000 mg | ORAL_TABLET | Freq: Four times a day (QID) | ORAL | Status: DC | PRN
  Administered 2020-06-24 – 2020-07-01 (×18): 10 mg via ORAL
  Filled 2020-06-23 (×18): qty 2

## 2020-06-23 MED ORDER — MAGNESIUM SULFATE 2 GM/50ML IV SOLN
2.0000 g | Freq: Once | INTRAVENOUS | Status: AC
  Administered 2020-06-23: 2 g via INTRAVENOUS
  Filled 2020-06-23: qty 50

## 2020-06-23 MED ORDER — DIGOXIN 125 MCG PO TABS
0.2500 mg | ORAL_TABLET | Freq: Once | ORAL | Status: AC
  Administered 2020-06-23: 0.25 mg via ORAL
  Filled 2020-06-23: qty 2
  Filled 2020-06-23 (×2): qty 1

## 2020-06-23 NOTE — Progress Notes (Signed)
Progress Note  Patient Name: QUANTE PETTRY Date of Encounter: 06/23/2020  Primary Cardiologist: Ida Rogue, MD  Subjective   Breathing unchanged.  Still w/ significant gut edema.  No c/p or palps.  Inpatient Medications    Scheduled Meds: . atorvastatin  80 mg Oral Daily  . Chlorhexidine Gluconate Cloth  6 each Topical Daily  . DULoxetine  30 mg Oral Daily  . ezetimibe  10 mg Oral Daily  . gabapentin  400 mg Oral QID  . insulin aspart  0-20 Units Subcutaneous Q4H  . methylPREDNISolone (SOLU-MEDROL) injection  40 mg Intravenous Daily  . midodrine  5 mg Oral TID WC  . mometasone-formoterol  2 puff Inhalation BID  . multivitamin with minerals  1 tablet Oral Daily  . pantoprazole  40 mg Oral Daily  . sodium chloride flush  3 mL Intravenous Q12H  . spironolactone  12.5 mg Oral Daily  . tamsulosin  0.4 mg Oral QPC supper  . traZODone  150 mg Oral QHS  . umeclidinium bromide  1 puff Inhalation Daily   Continuous Infusions: . sodium chloride 10 mL/hr at 06/23/20 0300  . furosemide (LASIX) 200 mg in dextrose 5% 100 mL (2mg /mL) infusion 6 mg/hr (06/23/20 0300)  . heparin 1,250 Units/hr (06/23/20 0736)  . milrinone 0.125 mcg/kg/min (06/23/20 0300)   PRN Meds: sodium chloride, acetaminophen, Ipratropium-Albuterol, ipratropium-albuterol, nitroGLYCERIN, ondansetron (ZOFRAN) IV, oxyCODONE, sodium chloride flush   Vital Signs    Vitals:   06/23/20 0400 06/23/20 0419 06/23/20 0500 06/23/20 0600  BP: 119/83  101/61 93/67  Pulse: (!) 104  64 90  Resp: 11  13 10   Temp: 97.9 F (36.6 C)     TempSrc: Oral     SpO2: 98%  99% 97%  Weight:  93.6 kg    Height:        Intake/Output Summary (Last 24 hours) at 06/23/2020 1032 Last data filed at 06/23/2020 0916 Gross per 24 hour  Intake 1474.25 ml  Output 3575 ml  Net -2100.75 ml   Filed Weights   06/20/20 0500 06/21/20 0500 06/23/20 0419  Weight: 102.7 kg 102.8 kg 93.6 kg    Physical Exam   GEN: Well nourished, well  developed, in no acute distress.  HEENT: Grossly normal.  Neck: Supple, JVD to jaw, no carotid bruits, or masses. Cardiac: IR, IR, tachy, 2/6 syst murmur LLSB  apex, no rubs, or gallops. No clubbing, cyanosis, edema. R BKA. Radials 2+, L DP/PT 1+.   Respiratory:  Respirations regular and unlabored, diminished breath sounds bilat w/ scattered rhonchi. GI: obese, semi-firm, nontender, bilat 2+ flank edema, BS + x 4. MS: no deformity or atrophy. Skin: warm and dry, no rash. Neuro:  Strength and sensation are intact. Psych: AAOx3.  Normal affect.  Labs    Chemistry Recent Labs  Lab 06/17/20 0130 06/18/20 0412 06/21/20 0539 06/22/20 0500 06/22/20 2353 06/23/20 0339  NA 125*   < > 134* 136  --  136  K 5.2*   < > 3.8 3.7 3.8 3.8  CL 90*   < > 90* 89*  --  89*  CO2 25   < > 34* 39*  --  39*  GLUCOSE 124*   < > 124* 100*  --  148*  BUN 34*   < > 40* 35*  --  34*  CREATININE 1.50*   < > 1.15 1.11  --  1.11  CALCIUM 9.1   < > 9.0 9.1  --  8.3*  PROT  7.0  --   --   --   --   --   ALBUMIN 3.8  --   --   --   --   --   AST 81*  --   --   --   --   --   ALT 37  --   --   --   --   --   ALKPHOS 114  --   --   --   --   --   BILITOT 3.7*  --   --   --   --   --   GFRNONAA 54*   < > >60 >60  --  >60  ANIONGAP 10   < > 10 8  --  8   < > = values in this interval not displayed.     Hematology Recent Labs  Lab 06/21/20 0539 06/22/20 0500 06/23/20 0339  WBC 5.7 6.5 6.3  RBC 4.23 4.42 4.50  HGB 11.3* 11.8* 12.2*  HCT 34.2* 36.2* 36.4*  MCV 80.9 81.9 80.9  MCH 26.7 26.7 27.1  MCHC 33.0 32.6 33.5  RDW 21.4* 21.7* 21.9*  PLT 123* 126* 116*    Cardiac Enzymes  Recent Labs  Lab 06/17/20 0049 06/17/20 0420  TROPONINIHS 39* 54*      BNP Recent Labs  Lab 06/17/20 0049  BNP 1,925.5*     Lipids  Lab Results  Component Value Date   CHOL 118 01/20/2020   HDL 34 (L) 01/20/2020   LDLCALC 77 01/20/2020   TRIG 37 01/20/2020   CHOLHDL 3.5 01/20/2020    HbA1c  Lab Results   Component Value Date   HGBA1C 6.1 (H) 06/17/2020    Radiology    No results found.  Telemetry    Afib, 90's to low 100's, brief run of NSVT yesterday afternoon - Personally Reviewed  Cardiac Studies   Cardiac Catheterization 01/2020  01/2020:  Prox RCA-1 lesion is 80% stenosed.  Prox RCA-2 lesion is 20% stenosed.  Prox RCA to Mid RCA lesion is 80% stenosed.  Prox LAD lesion is 20% stenosed.  1st Diag lesion is 80% stenosed.  Mid LAD lesion is 20% stenosed.  Dist LAD lesion is 60% stenosed.  Prox Cx to Mid Cx lesion is 90% stenosed.  LPAV lesion is 95% stenosed.   1. Significant underlying three-vessel coronary artery disease. Patent proximal LAD stent with mild in-stent restenosis. Moderate diffuse disease in the distal LAD. Significant stenosis in the proximal left circumflex at the origin of the posterior AV groove artery which is also heavily diseased at the ostium. This is a bifurcation lesion and heavily calcified. RCA stent is patent. However, there is significant proximal disease in the whole mid to distal segment is diffusely diseased and small caliber. 2. Left ventricular angiography was not performed. EF was severe reduced by echo. 3. Moderately elevated left ventricular end-diastolic pressure at 28 mmHg   Recommendations: The patient's cardiomyopathy seems to be out of proportion to his coronary artery disease as the LAD itself does not seem to have obstructive disease at the present time. The RCA is diffusely diseased and too small to stent. The only target for revascularization would be the left circumflex but it is a calcified bifurcation lesion and likely requires atherectomy. In order to do this safely, the patient has to be optimized from a heart failure standpoint as he appears to be volume overloaded. Recommend intravenous diuresis. If limited by hypotension, we might need to consider  inotropic support and small dose norepinephrine drip initially. I added  small dose digoxin. Treat underlying atrial fibrillation. Resume heparin drip 8 hours after sheath pull. _____________   Echocardiogram June 17, 2020 1. Left ventricular ejection fraction, by estimation, is <20%. Left  ventricular ejection fraction by PLAX is 14 %. The left ventricle has  severely decreased function. The left ventricle demonstrates global  hypokinesis. The left ventricular internal  cavity size was severely dilated. Left ventricular diastolic parameters  are indeterminate.  2. Right ventricular systolic function is severely reduced. The right  ventricular size is moderately enlarged. There is severely elevated  pulmonary artery systolic pressure. The estimated right ventricular  systolic pressure is 90.2 mmHg.  3. Left atrial size was severely dilated.  4. Right atrial size was severely dilated.  5. Tricuspid valve regurgitation is mild to moderate.  6. The inferior vena cava is dilated in size with <50% respiratory  variability, suggesting right atrial pressure of 15 mmHg.    Patient Profile     60 y.o. 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   Assessment & Plan    1.  Acute on chronic biventricular CHF/PAH: EF 14% by echo earlier this admission.  Remains volume overloaded w/ significant flank edema.  Minus 1.3L overnight and 15.6L since admission.  Still markedly volume overloaded w/ flank edema/JVD.  CVP trending high teens to 20's - was recorded @ 23 @ 0400.  Renal fxn stable. Cont current doses of milrinone, lasix gtt, and spiro. No  blocker in setting of  low-output and relative hypotn. No acei/arb/arni 2/2 relative hypotension. Plan to resume jardiance if bps stable.  2.  CAD/Demand Ischemia: No chest pain.  HsTrop of 54 in setting of above.  Known severe CAD. Cont med rx  statin/zetia.  Was on plavix @ home but is on hold in setting of bleeding around lines.  No ASA in setting of chronic eliquis (currently on hold).  3.  Permanent Afib: Rates 90's to low 100's.  Not on any rate controlling agents as home dose of metoprolol on hold in setting of low output CHF.  Could consider digoxin as renal fxn has improved.  Eliquis on hold in setting of bleeding around lines. Cont heparin for now.  4. AKI: Creat stable @ 1.11 this AM.  5.  Essential HTN: BP soft - 90's to low 100s.  Follow w/ diuresis.  6.  HL: cont statin/zetia.  LDL 77.  Could consider PCSK9i as outpt.  7. DMII: per IM.  consider resumign jardiance in setting of #1.  Signed, Murray Hodgkins, NP  06/23/2020, 10:32 AM    For questions or updates, please contact   Please consult [email or web address] for contact info under Cardiology/STEMI.

## 2020-06-23 NOTE — Progress Notes (Signed)
CRITICAL CARE NOTE 60 y.o.malewith a hx of ICM EF 20-25%, CAD/PCI, tobacco abuse, COPD, PAD s/p L SFA stenting, s/p R BKA, afib on Eliquis, OSA noncompliant with CPAP, hx of leaving AMA and noncompliancewho is being seen today for the evaluation ofheart failure.presents to the emergency room with shortness of breath, fluid retention in his legs and intermittent chest pain. He uses albuterol which helped a bit but continues to have dyspnea with exertion. He denies cough, fever or chills  Cardiology Consulted recommend ICU admission for inotrope Nephrology consulted for progressive cardiorenal syndrome-placed on lasix infusion   EVENTS/Hospital Course 1/9 admission to ICU for severe heart failure 1/9 CVL placed 1/10 on milrinone infusion/lasic infusion 1/11 severe end stage CHF, prognosis is poor 1/12 stopped eliquis and plavix 1/14- patient on milrinone drip remains in MICU.  He is lucid and able to speak appropriately.    CC  Follow up CHF exacerbation  SUBJECTIVE Alert and awake On minimal oxygen  On milrinone infusion lasix infusion  BP 93/67   Pulse 90   Temp 97.9 F (36.6 C) (Oral)   Resp 10   Ht 5\' 7"  (1.702 m)   Wt 93.6 kg   SpO2 97%   BMI 32.32 kg/m    I/O last 3 completed shifts: In: 2289.7 [P.O.:1800; I.V.:439.7; IV Piggyback:50] Out: 9767 [Urine:5350] Total I/O In: -  Out: 500 [Urine:500]  SpO2: 97 % O2 Flow Rate (L/min): 2 L/min  Estimated body mass index is 32.32 kg/m as calculated from the following:   Height as of this encounter: 5\' 7"  (1.702 m).   Weight as of this encounter: 93.6 kg.   Review of Systems:  Gen:  fatigued HEENT: Denies blurred vision, double vision, ear pain, eye pain, hearing loss, nose bleeds, sore throat Cardiac:  No dizziness, chest pain or heaviness, chest tightness,edema, No JVD Resp:   +SOB Other:  All other systems negative   Physical Examination:   General Appearance: No distress  Neuro:without focal  findings,  speech normal,  HEENT: PERRLA, EOM intact.   Pulmonary: normal breath sounds, No wheezing.  CardiovascularNormal S1,S2.  No m/r/g.    ALL OTHER ROS ARE NEGATIVE   MEDICATIONS: I have reviewed all medications and confirmed regimen as documented   CULTURE RESULTS   Recent Results (from the past 240 hour(s))  Resp Panel by RT-PCR (Flu A&B, Covid)     Status: None   Collection Time: 06/17/20  6:00 AM  Result Value Ref Range Status   SARS Coronavirus 2 by RT PCR NEGATIVE NEGATIVE Final    Comment: (NOTE) SARS-CoV-2 target nucleic acids are NOT DETECTED.  The SARS-CoV-2 RNA is generally detectable in upper respiratory specimens during the acute phase of infection. The lowest concentration of SARS-CoV-2 viral copies this assay can detect is 138 copies/mL. A negative result does not preclude SARS-Cov-2 infection and should not be used as the sole basis for treatment or other patient management decisions. A negative result may occur with  improper specimen collection/handling, submission of specimen other than nasopharyngeal swab, presence of viral mutation(s) within the areas targeted by this assay, and inadequate number of viral copies(<138 copies/mL). A negative result must be combined with clinical observations, patient history, and epidemiological information. The expected result is Negative.  Fact Sheet for Patients:  EntrepreneurPulse.com.au  Fact Sheet for Healthcare Providers:  IncredibleEmployment.be  This test is no t yet approved or cleared by the [place] FDA and  has been authorized for detection and/or diagnosis of SARS-CoV-2 by  FDA under an Emergency Use Authorization (EUA). This EUA will remain  in effect (meaning this test can be used) for the duration of the COVID-19 declaration under Section 564(b)(1) of the Act, 21 U.S.C.section 360bbb-3(b)(1), unless the authorization is terminated  or revoked sooner.        Influenza A by PCR NEGATIVE NEGATIVE Final   Influenza B by PCR NEGATIVE NEGATIVE Final    Comment: (NOTE) The Xpert Xpress SARS-CoV-2/FLU/RSV plus assay is intended as an aid in the diagnosis of influenza from Nasopharyngeal swab specimens and should not be used as a sole basis for treatment. Nasal washings and aspirates are unacceptable for Xpert Xpress SARS-CoV-2/FLU/RSV testing.  Fact Sheet for Patients: EntrepreneurPulse.com.au  Fact Sheet for Healthcare Providers: IncredibleEmployment.be  This test is not yet approved or cleared by the [place] FDA and has been authorized for detection and/or diagnosis of SARS-CoV-2 by FDA under an Emergency Use Authorization (EUA). This EUA will remain in effect (meaning this test can be used) for the duration of the COVID-19 declaration under Section 564(b)(1) of the Act, 21 U.S.C. section 360bbb-3(b)(1), unless the authorization is terminated or revoked.  Performed at [facility], [address] [place], [place] [address]           Central Line/ continued, requirement due to  Reason to continue [employer] of central venous pressure or other hemodynamic parameters and poor IV access      ASSESSMENT AND PLAN SYNOPSIS  60 yo white male with multiple medical issues with severe and acute systolic CHF exacerbation with shock with progressive cardiorenal syndrome  Cardiogenic shock due to  [place]- EF <20 % admitted to ICU for placement of CVL and milrinone infusion Prognosis is extremely poor    - on milrinone drip and on midodrine per tube.     Morbid obesity, possible OSA.   Will certainly impact respiratory mechanics  ACUTE KIDNEY INJURY/Renal Failure -continue Foley Catheter-assess need -Avoid nephrotoxic agents -Follow urine output, BMP -Ensure adequate renal perfusion, optimize oxygenation -Renal dose  medications   SHOCK-CARDIOGENIC -use vasopressors to keep MAP>65  DIET-->as tolerated Constipation protocol as indicated  ELECTROLYTES -follow labs as needed -replace as needed -pharmacy consultation and following   DVT/GI PRX ordered and assessed TRANSFUSIONS AS NEEDED MONITOR FSBS I Assessed the need for Labs I Assessed the need for Foley I Assessed the need for Central Venous Line Family Discussion when available I Assessed the need for Mobilization I made an Assessment of medications to be adjusted accordingly Safety Risk assessment completed  CASE DISCUSSED IN MULTIDISCIPLINARY ROUNDS WITH ICU TEAM  prognosis is guarded.    Recommend Palliative care for assessment for Hospice Critical care provider statement:    Critical care time (minutes):  33   Critical care time was exclusive of:  Separately billable procedures and  treating other patients   Critical care was necessary to treat or prevent imminent or  life-threatening deterioration of the following conditions:  advanced CHF, cardiogenic shock   Critical care was time spent personally by me on the following  activities:  Development of treatment plan with patient or surrogate,  discussions with consultants, evaluation of patient's response to  treatment, examination of patient, obtaining history from patient or  surrogate, ordering and performing treatments and interventions, ordering  and review of laboratory studies and re-evaluation of patient's condition   I assumed direction of critical care for this patient from another  provider in my specialty: no  Ottie Glazier, M.D.  Pulmonary & [place]

## 2020-06-23 NOTE — Progress Notes (Signed)
[name] [name] for heparin Indication: atrial fibrillation  Allergies  Allergen Reactions  . Dulaglutide Anaphylaxis, Diarrhea and Hives  . Other Itching    Skin itching associated with nitro patch  . Prednisone Rash    Patient Measurements: Height: 5\' 7"  (170.2 cm) Weight: 93.6 kg (206 lb 5.6 oz) IBW/kg (Calculated) : 66.1 Heparin Dosing Weight: 88 kg  Vital Signs: Temp: 97.9 F (36.6 C) (01/14 0400) Temp Source: Oral (01/14 0400) BP: 119/83 (01/14 0400) Pulse Rate: 104 (01/14 0400)  Labs: Recent Labs    06/21/20 0539 06/22/20 0500 06/22/20 1241 06/22/20 2128 06/23/20 0339  HGB 11.3* 11.8*  --   --  12.2*  HCT 34.2* 36.2*  --   --  36.4*  PLT 123* 126*  --   --  116*  APTT  --   --  26 83* 84*  LABPROT  --   --  15.3*  --   --   INR  --   --  1.3*  --   --   HEPARINUNFRC  --   --  0.40  --  0.58  CREATININE 1.15 1.11  --   --  1.11    Estimated Creatinine Clearance: 80.1 mL/min (by C-G formula based on SCr of 1.11 mg/dL).   Medical History: Past Medical History:  Diagnosis Date  . Arthritis   . Asthma   . Atherosclerosis   . BPH (benign prostatic hyperplasia)   . Carotid arterial disease ([place])   . Charcot's joint of foot, right   . Chronic combined systolic (congestive) and diastolic (congestive) heart failure ([place])    a. 02/2013 EF 50% by LV gram; b. 04/2017 Echo: EF 25-30%. diff HK. Gr2 DD. Mod dil LA/RV. PASP 67mmHg.  [name] COPD (chronic obstructive pulmonary disease) ([place])   . Coronary artery disease    a. 2013 S/P PCI of LAD [facility]);  b. 03/2013 PCI: RCA 90p ( 2.5 x 23 mm DES); c. 02/2014 Cath: patent RCA stent->Med Rx; d. 04/2017 Cath: LM nl, LAd 30ost/p, 12m/d, D1 80ost, LCX 90p/m, OM1 85, RCA 70/20p, 40m, 70d->Referred for CT Surg-felt to be poor candidate.  . Diabetic neuropathy ([place])   . Diabetic ulcer of right foot ([place])   . Hernia   . Hyperlipidemia   . Hypertension   . Ischemic cardiomyopathy    a. 04/2017 Echo:  EF 25-30%; b. 08/2017 Cardiac MRI ([facility]): EF 19%, sev glob HK. RVEF 20%, mod BAE, triv MR, mild-mod TR. Basal lateral subendocardial infarct (viable), inf/infsept, dist septal ischemia, basal to mid lat peri-infarct ischemia.  . Morbid obesity ([place])   . Neuropathy   . PAD (peripheral artery disease) ([place])    a. Followed by Dr. Lucky Cowboy; b. 08/2010 Periph Angio: RSFA 70-80p (6X60 self-expanding stent); c. 09/2010 Periph Angio: L SFA 100p (7x unknown length self-expanding stent); d. 06/2015 Periph Angio: R SFA short segment occlusion (6x12 self-expanding stent).  . Restless leg syndrome   . Sleep apnea   . Subclavian artery stenosis, right ([place])   . Syncope and collapse   . Tobacco use    a. 75+ yr hx - still smoking 1ppd, down from 2 ppd.  . Type II diabetes mellitus ([place])   . Varicose vein      Assessment: 60 year old male with HFrEF, EF < 20% in the ICU for furosemide and milrinone drips. Patient also has h/o afib on Eliquis, which was held for some neck pain/bruising at the site of his central  line. This has improved. Plan for heparin drip per Cardiology.   Goal of Therapy:  HL 0.3 - 0.7 APTT 66 - 102 sec Monitor platelets by anticoagulation protocol: Yes   Plan:  1/13: HL @ 1241 (baseline) = 0.4, aPTT @ 1241 = 26  1/13: aPTT @ 2128 = 83  Will continue pt on current rate and draw confirmation aPTT on 1/14 @ 0330. Will recheck HL on 1/14 @ 0330.  Will use aPTT until HL and aPTT are therapeutic.    0114 0339 HL 0.58, aPTT 84, therapeutic x 2, correlating.  Will use HL to monitor Heparin infusion.  Platelets trending down some, H/H stable.  Will continue Heparin at current rate and recheck HL and CBC tomorrow am   Ena Dawley, PharmD 06/23/2020,4:52 AM

## 2020-06-23 NOTE — Progress Notes (Signed)
Physical Therapy Treatment Patient Details Name: [name] [name] MRN: [id number] DOB: 10/14/1960 Today's Date: 06/23/2020    History of Present Illness Pt is a 60 y/o M with PMH: Arthritis, Asthma, Atherosclerosis, BPH, Charcot's joint of foot-right, Chronic combined systolic and diastolic heart failure, COPD, CAD/PCI, tobacco abuse, PAD s/p L SFA stenting, s/p R BKA, afib on Eliquis, OSA noncompliant with CPAP, and h/o leaving AMA/medical noncompliance. Pt presented d/t SOB, DOE and fluid retention in LEs. Trop slightly elevated, Cardiology note indicates: "patient declined CABG in the past at [facility]; He had a cath in August showing no great PCI targets but considered atherectomy of Lcx however patient eventually left AMA, No plan for ischemic work-up at this time". Admitted acutely for management of acute/chronic CHF, requiring for milrinone drip.    PT Comments    Pt resting in bed upon PT arrival; agreeable to LE ex's in bed.  Pt requiring pacing and rest breaks between ex's d/t SOB and fatigue.  HR 96-111 bpm at rest and increased up to 124 bpm with activity; O2 sats WFL during sessions activities.  No reports of pain.  Pt declined OOB mobility s/p ex's d/t fatigue.  Will continue to focus on strengthening and progressive functional mobility per pt tolerance.    Follow Up Recommendations  SNF     Equipment Recommendations   (if returns home, may benefit from hospital bed)    Recommendations for Other Services       Precautions / Restrictions Precautions Precautions: Fall Restrictions Weight Bearing Restrictions: No RLE Weight Bearing:  (R BKA) Other Position/Activity Restrictions: monitor HR    Mobility  Bed Mobility               General bed mobility comments: Deferred d/t pt fatigue  Transfers                  Deferred d/t pt fatigue  Ambulation/Gait                 Stairs             Wheelchair Mobility    Modified Rankin (Stroke Patients  Only)       Balance                                            Cognition Arousal/Alertness: Awake/alert Behavior During Therapy: WFL for tasks assessed/performed Overall Cognitive Status: Within Functional Limits for tasks assessed                                        Exercises General Exercises - Lower Extremity Ankle Circles/Pumps: AROM;Strengthening;Left;10 reps;Supine Quad Sets: AROM;Strengthening;Both;10 reps;Supine Short Arc Quad: AROM;Strengthening;Both;10 reps;Supine Heel Slides: AROM;Strengthening;Both;10 reps;Supine Hip ABduction/ADduction: AROM;Strengthening;Both;10 reps;Supine Straight Leg Raises: AROM;Strengthening;Both;10 reps;Supine    General Comments   Nursing cleared pt for participation in physical therapy.  Pt agreeable to PT session.      Pertinent Vitals/Pain Pain Assessment: No/denies pain    Home Living                      Prior Function            PT Goals (current goals can now be found in the care plan section) Acute Rehab PT Goals Patient Stated Goal:  to go home PT Goal Formulation: With patient Time For Goal Achievement: 07/05/20 Potential to Achieve Goals: Fair Progress towards PT goals: Progressing toward goals    Frequency    Min 2X/week      PT Plan Current plan remains appropriate    Co-evaluation              AM-PAC PT "6 Clicks" Mobility   Outcome Measure  Help needed turning from your back to your side while in a flat bed without using bedrails?: A Little Help needed moving from lying on your back to sitting on the side of a flat bed without using bedrails?: A Little Help needed moving to and from a bed to a chair (including a wheelchair)?: A Lot Help needed standing up from a chair using your arms (e.g., wheelchair or bedside chair)?: A Lot Help needed to walk in hospital room?: Total Help needed climbing 3-5 steps with a railing? : Total 6 Click Score: 12     End of Session Equipment Utilized During Treatment: Gait belt Activity Tolerance: Patient limited by fatigue Patient left: in bed;with call bell/phone within reach;with bed alarm set Nurse Communication: Mobility status;Precautions;Other (comment) (pt's HR during session and fatigue) PT Visit Diagnosis: Muscle weakness (generalized) (M62.81);Difficulty in walking, not elsewhere classified (R26.2)     Time: 1107-1130 PT Time Calculation (min) (ACUTE ONLY): 23 min  Charges:  $Therapeutic Exercise: 23-37 mins                    Leitha Bleak, PT 06/23/20, 11:50 AM

## 2020-06-24 DIAGNOSIS — I5023 Acute on chronic systolic (congestive) heart failure: Secondary | ICD-10-CM | POA: Diagnosis not present

## 2020-06-24 DIAGNOSIS — I4819 Other persistent atrial fibrillation: Secondary | ICD-10-CM | POA: Diagnosis not present

## 2020-06-24 LAB — CBC WITH DIFFERENTIAL/PLATELET
Abs Immature Granulocytes: 0.05 10*3/uL (ref 0.00–0.07)
Basophils Absolute: 0 10*3/uL (ref 0.0–0.1)
Basophils Relative: 0 %
Eosinophils Absolute: 0 10*3/uL (ref 0.0–0.5)
Eosinophils Relative: 0 %
HCT: 33.9 % — ABNORMAL LOW (ref 39.0–52.0)
Hemoglobin: 11.6 g/dL — ABNORMAL LOW (ref 13.0–17.0)
Immature Granulocytes: 1 %
Lymphocytes Relative: 11 %
Lymphs Abs: 0.7 10*3/uL (ref 0.7–4.0)
MCH: 27.7 pg (ref 26.0–34.0)
MCHC: 34.2 g/dL (ref 30.0–36.0)
MCV: 80.9 fL (ref 80.0–100.0)
Monocytes Absolute: 0.5 10*3/uL (ref 0.1–1.0)
Monocytes Relative: 8 %
Neutro Abs: 4.8 10*3/uL (ref 1.7–7.7)
Neutrophils Relative %: 80 %
Platelets: 108 10*3/uL — ABNORMAL LOW (ref 150–400)
RBC: 4.19 MIL/uL — ABNORMAL LOW (ref 4.22–5.81)
RDW: 21.2 % — ABNORMAL HIGH (ref 11.5–15.5)
WBC: 6.1 10*3/uL (ref 4.0–10.5)
nRBC: 0 % (ref 0.0–0.2)

## 2020-06-24 LAB — BASIC METABOLIC PANEL
Anion gap: 9 (ref 5–15)
BUN: 35 mg/dL — ABNORMAL HIGH (ref 6–20)
CO2: 39 mmol/L — ABNORMAL HIGH (ref 22–32)
Calcium: 8.8 mg/dL — ABNORMAL LOW (ref 8.9–10.3)
Chloride: 88 mmol/L — ABNORMAL LOW (ref 98–111)
Creatinine, Ser: 1.04 mg/dL (ref 0.61–1.24)
GFR, Estimated: >60 mL/min (ref >60)
Glucose, Bld: 91 mg/dL (ref 70–99)
Potassium: 3.8 mmol/L (ref 3.5–5.1)
Sodium: 136 mmol/L (ref 135–145)

## 2020-06-24 LAB — GLUCOSE, CAPILLARY
Glucose-Capillary: 95 mg/dL (ref 70–99)
Glucose-Capillary: 91 mg/dL (ref 70–99)
Glucose-Capillary: 140 mg/dL — ABNORMAL HIGH (ref 70–99)
Glucose-Capillary: 230 mg/dL — ABNORMAL HIGH (ref 70–99)
Glucose-Capillary: 277 mg/dL — ABNORMAL HIGH (ref 70–99)

## 2020-06-24 LAB — HEPARIN LEVEL (UNFRACTIONATED): Heparin Unfractionated: 0.61 IU/mL (ref 0.30–0.70)

## 2020-06-24 LAB — PHOSPHORUS: Phosphorus: 2.6 mg/dL (ref 2.5–4.6)

## 2020-06-24 LAB — MAGNESIUM: Magnesium: 2 mg/dL (ref 1.7–2.4)

## 2020-06-24 NOTE — Progress Notes (Signed)
Progress Note  Patient Name: [name] [name] Date of Encounter: 06/24/2020  Primary Cardiologist: Ida Rogue, MD  Subjective   Feels about the same. Breathing stable.  Still w/ significant abd bloating and flank edema.  UO good and renal fxn stable.  Inpatient Medications    Scheduled Meds: . atorvastatin  80 mg Oral Daily  . Chlorhexidine Gluconate Cloth  6 each Topical Daily  . digoxin  0.125 mg Oral Daily  . DULoxetine  30 mg Oral Daily  . ezetimibe  10 mg Oral Daily  . gabapentin  400 mg Oral QID  . insulin aspart  0-20 Units Subcutaneous Q4H  . methylPREDNISolone (SOLU-MEDROL) injection  40 mg Intravenous Daily  . midodrine  10 mg Oral TID WC  . mometasone-formoterol  2 puff Inhalation BID  . multivitamin with minerals  1 tablet Oral Daily  . pantoprazole  40 mg Oral Daily  . sodium chloride flush  3 mL Intravenous Q12H  . spironolactone  12.5 mg Oral Daily  . tamsulosin  0.4 mg Oral QPC supper  . traZODone  150 mg Oral QHS  . umeclidinium bromide  1 puff Inhalation Daily   Continuous Infusions: . sodium chloride 10 mL/hr at 06/23/20 0300  . furosemide (LASIX) 200 mg in dextrose 5% 100 mL (2mg /mL) infusion 6 mg/hr (06/23/20 0300)  . heparin 1,250 Units/hr (06/23/20 0736)  . milrinone 0.125 mcg/kg/min (06/23/20 1948)   PRN Meds: sodium chloride, acetaminophen, Ipratropium-Albuterol, ipratropium-albuterol, nitroGLYCERIN, ondansetron (ZOFRAN) IV, oxyCODONE, sodium chloride flush   Vital Signs    Vitals:   06/24/20 0400 06/24/20 0500 06/24/20 0600 06/24/20 0700  BP: 107/78 125/80 110/71 (!) 133/103  Pulse: 92 94 87 70  Resp: 17 14 13  (!) 23  Temp:      TempSrc:      SpO2: 100% 98% 100% 99%  Weight:      Height:        Intake/Output Summary (Last 24 hours) at 06/24/2020 0816 Last data filed at 06/24/2020 0700 Gross per 24 hour  Intake --  Output 2350 ml  Net -2350 ml   Filed Weights   06/20/20 0500 06/21/20 0500 06/23/20 0419  Weight: 102.7 kg 102.8  kg 93.6 kg    Physical Exam   GEN: Well nourished, well developed, in no acute distress.  HEENT: Grossly normal.  Neck: Supple, JVD to jaw, no carotid bruits, or masses. Cardiac: IR, IR, tachy, 2/6 syst murmur LLSB  apex. No rubs, or gallops. No clubbing, cyanosis, edema. R BKA.  Radials 2+, L DP/PT 1+.  Respiratory:  Respirations regular and unlabored, coarse breath sounds/rhonchi throughout w/ occas insp/exp wheeze GI: obese, semi-firm, nontender, 2+ bilat flank edema, BS + x 4. MS: no deformity or atrophy. Skin: warm and dry, no rash. Neuro:  Strength and sensation are intact. Psych: AAOx3.  Normal affect.  Labs    Chemistry Recent Labs  Lab 06/22/20 0500 06/22/20 2353 06/23/20 0339 06/24/20 0508  NA 136  --  136 136  K 3.7 3.8 3.8 3.8  CL 89*  --  89* 88*  CO2 39*  --  39* 39*  GLUCOSE 100*  --  148* 91  BUN 35*  --  34* 35*  CREATININE 1.11  --  1.11 1.04  CALCIUM 9.1  --  8.3* 8.8*  GFRNONAA >60  --  >60 >60  ANIONGAP 8  --  8 9     Hematology Recent Labs  Lab 06/22/20 0500 06/23/20 0339 06/24/20 3790  WBC 6.5 6.3 6.1  RBC 4.42 4.50 4.19*  HGB 11.8* 12.2* 11.6*  HCT 36.2* 36.4* 33.9*  MCV 81.9 80.9 80.9  MCH 26.7 27.1 27.7  MCHC 32.6 33.5 34.2  RDW 21.7* 21.9* 21.2*  PLT 126* 116* 108*    Cardiac Enzymes  Recent Labs  Lab 06/17/20 0049 06/17/20 0420  TROPONINIHS 39* 54*      Lipids  Lab Results  Component Value Date   CHOL 118 01/20/2020   HDL 34 (L) 01/20/2020   LDLCALC 77 01/20/2020   TRIG 37 01/20/2020   CHOLHDL 3.5 01/20/2020    HbA1c  Lab Results  Component Value Date   HGBA1C 6.1 (H) 06/17/2020    Radiology    No results found.  Telemetry    Afib 90's to 120's, pvcs - Personally Reviewed  Cardiac Studies   Cardiac Catheterization 01/2020  01/2020:  Prox RCA-1 lesion is 80% stenosed.  Prox RCA-2 lesion is 20% stenosed.  Prox RCA to Mid RCA lesion is 80% stenosed.  Prox LAD lesion is 20% stenosed.  1st Diag  lesion is 80% stenosed.  Mid LAD lesion is 20% stenosed.  Dist LAD lesion is 60% stenosed.  Prox Cx to Mid Cx lesion is 90% stenosed.  LPAV lesion is 95% stenosed.  1. Significant underlying three-vessel coronary artery disease. Patent proximal LAD stent with mild in-stent restenosis. Moderate diffuse disease in the distal LAD. Significant stenosis in the proximal left circumflex at the origin of the posterior AV groove artery which is also heavily diseased at the ostium. This is a bifurcation lesion and heavily calcified. RCA stent is patent. However, there is significant proximal disease in the whole mid to distal segment is diffusely diseased and small caliber. 2. Left ventricular angiography was not performed. EF was severe reduced by echo. 3. Moderately elevated left ventricular end-diastolic pressure at 28 mmHg  Recommendations: The patient's cardiomyopathy seems to be out of proportion to his coronary artery disease as the LAD itself does not seem to have obstructive disease at the present time. The RCA is diffusely diseased and too small to stent. The only target for revascularization would be the left circumflex but it is a calcified bifurcation lesion and likely requires atherectomy. In order to do this safely, the patient has to be optimized from a heart failure standpoint as he appears to be volume overloaded. Recommend intravenous diuresis. If limited by hypotension, we might need to consider inotropic support and small dose norepinephrine drip initially. I added small dose digoxin. Treat underlying atrial fibrillation. Resume heparin drip 8 hours after sheath pull. _____________   Echocardiogram June 17, 2020 1. Left ventricular ejection fraction, by estimation, is <20%. Left  ventricular ejection fraction by PLAX is 14 %. The left ventricle has  severely decreased function. The left ventricle demonstrates global  hypokinesis. The left ventricular internal  cavity size  was severely dilated. Left ventricular diastolic parameters  are indeterminate.  2. Right ventricular systolic function is severely reduced. The right  ventricular size is moderately enlarged. There is severely elevated  pulmonary artery systolic pressure. The estimated right ventricular  systolic pressure is 62.3 mmHg.  3. Left atrial size was severely dilated.  4. Right atrial size was severely dilated.  5. Tricuspid valve regurgitation is mild to moderate.  6. The inferior vena cava is dilated in size with <50% respiratory  variability, suggesting right atrial pressure of 15 mmHg.    Patient Profile     60 y.o.malewith history of CAD  previously at [facility] though patient denied CABG, chronic combined systolic and diastolic CHF dating back to 2018, ICM, pulmonary HTN, DM2 complicated by neuropathy and lower extremity ulcerations, PAD s/p stenting, s/p R BKA followed by [facility], carotid artery disease, HTN, HLD, COPD 2/2 to ongoing tobacco use, pbesity, OSA not compliant with CPAP, anemia and medical noncompliance who is being followed for BIV failure.   Assessment & Plan    1.  Acute on chronic biV CHF/PAH:  EF 14% by echo earlier this admission. Remains volume overloaded w/ significant flank edema and abd distension.  Minus 2.8L overnight and 16.9L since admission.  CVP improved- trending 12-13.  Renal fxn stable.  Cont current doses of milrinone, lasix gtt, digoxin (added 1/14), and spiro. No  blocker in setting of low-ouput and relative hypotension.  No acei/arb/arni 2/2 relative hypotension. Rec resuming home dose of jardiance.  If ICU bed needed he would be a good candidate for lower level of care given stability on current doses of milrinone/lasix.  2.   CAD/Demand ischemia:  No c/p. HsTrop of 54 in setting of above.  Known severe CAD.  Cont med rx  statin/zetia.  Was on plax @ home but on hold in setting of bleeding around lines.  No asa in setting of chronic eliquis (also on hold).     3.  Permanent Afib:  Rates variable - 90's to 120's.  Digoxin added yesterday.   blocker on hold in setting of low-output req pressors and relative hypotension req midodrine.  Eliquis on hold in setting of bleeding around lines.  H/H has been stable.  If ok w/ PCCM, consider d/c'ing heparin and resuming eliquis.  4.  AKI:  Stable.  5.  Essential HTN/relative hypotension:  BPs stable on midodrine.  Follow.  6.  HL:  LDL 77.  Cont statin/zetia.  Will consider PCSK9i as outpt.  7.  DMII:  rec resumption of empagliflozin in setting of #1 -Renal fxn/bp's stable.  Signed, Murray Hodgkins, NP  06/24/2020, 8:16 AM    For questions or updates, please contact   Please consult [email or web address] for contact info under Cardiology/STEMI.

## 2020-06-24 NOTE — Progress Notes (Signed)
[facility] for heparin Indication: atrial fibrillation  Allergies  Allergen Reactions  . Dulaglutide Anaphylaxis, Diarrhea and Hives  . Other Itching    Skin itching associated with nitro patch  . Prednisone Rash    Patient Measurements: Height: 5\' 7"  (170.2 cm) Weight: 93.6 kg (206 lb 5.6 oz) IBW/kg (Calculated) : 66.1 Heparin Dosing Weight: 88 kg  Vital Signs: Temp: 98.3 F (36.8 C) (01/15 0000) Temp Source: Oral (01/15 0000) BP: 125/80 (01/15 0500) Pulse Rate: 94 (01/15 0500)  Labs: Recent Labs    06/22/20 0500 06/22/20 1241 06/22/20 2128 06/23/20 0339 06/24/20 0508  HGB 11.8*  --   --  12.2*  --   HCT 36.2*  --   --  36.4*  --   PLT 126*  --   --  116*  --   APTT  --  26 83* 84*  --   LABPROT  --  15.3*  --   --   --   INR  --  1.3*  --   --   --   HEPARINUNFRC  --  0.40  --  0.58 0.61  CREATININE 1.11  --   --  1.11 1.04    Estimated Creatinine Clearance: 85.5 mL/min (by C-G formula based on SCr of 1.04 mg/dL).   Medical History: Past Medical History:  Diagnosis Date  . Arthritis   . Asthma   . Atherosclerosis   . BPH (benign prostatic hyperplasia)   . Carotid arterial disease ([place])   . Charcot's joint of foot, right   . Chronic combined systolic (congestive) and diastolic (congestive) heart failure ([place])    a. 02/2013 EF 50% by LV gram; b. 04/2017 Echo: EF 25-30%. diff HK. Gr2 DD. Mod dil LA/RV. PASP 52mmHg.  [name] COPD (chronic obstructive pulmonary disease) ([place])   . Coronary artery disease    a. 2013 S/P PCI of LAD [facility]);  b. 03/2013 PCI: RCA 90p ( 2.5 x 23 mm DES); c. 02/2014 Cath: patent RCA stent->Med Rx; d. 04/2017 Cath: LM nl, LAd 30ost/p, 49m/d, D1 80ost, LCX 90p/m, OM1 85, RCA 70/20p, 23m, 70d->Referred for CT Surg-felt to be poor candidate.  . Diabetic neuropathy ([place])   . Diabetic ulcer of right foot ([place])   . Hernia   . Hyperlipidemia   . Hypertension   . Ischemic cardiomyopathy    a. 04/2017 Echo: EF  25-30%; b. 08/2017 Cardiac MRI ([facility]): EF 19%, sev glob HK. RVEF 20%, mod BAE, triv MR, mild-mod TR. Basal lateral subendocardial infarct (viable), inf/infsept, dist septal ischemia, basal to mid lat peri-infarct ischemia.  . Morbid obesity ([place])   . Neuropathy   . PAD (peripheral artery disease) ([place])    a. Followed by Dr. Lucky Cowboy; b. 08/2010 Periph Angio: RSFA 70-80p (6X60 self-expanding stent); c. 09/2010 Periph Angio: L SFA 100p (7x unknown length self-expanding stent); d. 06/2015 Periph Angio: R SFA short segment occlusion (6x12 self-expanding stent).  . Restless leg syndrome   . Sleep apnea   . Subclavian artery stenosis, right ([place])   . Syncope and collapse   . Tobacco use    a. 75+ yr hx - still smoking 1ppd, down from 2 ppd.  . Type II diabetes mellitus ([place])   . Varicose vein      Assessment: 60 year old male with HFrEF, EF < 20% in the ICU for furosemide and milrinone drips. Patient also has h/o afib on Eliquis, which was held for some neck pain/bruising at the  site of his central line. This has improved. Plan for heparin drip per Cardiology.   Goal of Therapy:  HL 0.3 - 0.7 APTT 66 - 102 sec Monitor platelets by anticoagulation protocol: Yes   Plan:  1/13: HL @ 1241 (baseline) = 0.4, aPTT @ 1241 = 26  1/13: aPTT @ 2128 = 83  Will continue pt on current rate and draw confirmation aPTT on 1/14 @ 0330. Will recheck HL on 1/14 @ 0330.  Will use aPTT until HL and aPTT are therapeutic.    0115 0508 HL 0.61, therapeutic x 3. Will use HL to monitor Heparin infusion.  CBC still pending. Platelets trending down some yesterday, H/H stable.  Will continue Heparin at current rate.  Continue to monitor CBC, PLTs   Ena Dawley, PharmD 06/24/2020,6:03 AM

## 2020-06-24 NOTE — Progress Notes (Signed)
CRITICAL CARE NOTE   60 y.o.malewith a hx of ICM EF 20-25%, CAD/PCI, tobacco abuse, COPD, PAD s/p L SFA stenting, s/p R BKA, afib on Eliquis, OSA noncompliant with CPAP, hx of leaving AMA and noncompliancewho is being seen today for the evaluation ofheart failure.presents to the emergency room with shortness of breath, fluid retention in his legs and intermittent chest pain. He uses albuterol which helped a bit but continues to have dyspnea with exertion. He denies cough, fever or chills  Cardiology Consulted recommend ICU admission for inotrope Nephrology consulted for progressive cardiorenal syndrome-placed on lasix infusion   EVENTS/Hospital Course 1/9 admission to ICU for severe heart failure 1/9 CVL placed 1/10 on milrinone infusion/lasic infusion 1/11 severe end stage CHF, prognosis is poor 1/12 stopped eliquis and plavix 1/14-15/2022 patient on milrinone drip remains in MICU.  He is lucid and able to speak appropriately.     CC  Follow up CHF exacerbation  SUBJECTIVE Alert and awake On minimal oxygen  On milrinone infusion lasix infusion  BP (!) 133/103   Pulse 70   Temp 98.3 F (36.8 C) (Oral)   Resp (!) 23   Ht 5\' 7"  (1.702 m)   Wt 93.6 kg   SpO2 99%   BMI 32.32 kg/m    I/O last 3 completed shifts: In: 245.2 [I.V.:195.2; IV Piggyback:50] Out: 2703 [Urine:4825] Total I/O In: 3 [I.V.:3] Out: 275 [Urine:275]  SpO2: 99 % O2 Flow Rate (L/min): 2 L/min  Estimated body mass index is 32.32 kg/m as calculated from the following:   Height as of this encounter: 5\' 7"  (1.702 m).   Weight as of this encounter: 93.6 kg.   Review of Systems:  Gen:  fatigued HEENT: Denies blurred vision, double vision, ear pain, eye pain, hearing loss, nose bleeds, sore throat Cardiac:  No dizziness, chest pain or heaviness, chest tightness,edema, No JVD Resp:   +SOB Other:  All other systems negative   Physical Examination:   General Appearance: No distress   Neuro:without focal findings,  speech normal,  HEENT: PERRLA, EOM intact.   Pulmonary: normal breath sounds, No wheezing.  CardiovascularNormal S1,S2.  No m/r/g.    ALL OTHER ROS ARE NEGATIVE   MEDICATIONS: I have reviewed all medications and confirmed regimen as documented   CULTURE RESULTS   Recent Results (from the past 240 hour(s))  Resp Panel by RT-PCR (Flu A&B, Covid)     Status: None   Collection Time: 06/17/20  6:00 AM  Result Value Ref Range Status   SARS Coronavirus 2 by RT PCR NEGATIVE NEGATIVE Final    Comment: (NOTE) SARS-CoV-2 target nucleic acids are NOT DETECTED.  The SARS-CoV-2 RNA is generally detectable in upper respiratory specimens during the acute phase of infection. The lowest concentration of SARS-CoV-2 viral copies this assay can detect is 138 copies/mL. A negative result does not preclude SARS-Cov-2 infection and should not be used as the sole basis for treatment or other patient management decisions. A negative result may occur with  improper specimen collection/handling, submission of specimen other than nasopharyngeal swab, presence of viral mutation(s) within the areas targeted by this assay, and inadequate number of viral copies(<138 copies/mL). A negative result must be combined with clinical observations, patient history, and epidemiological information. The expected result is Negative.  Fact Sheet for Patients:  EntrepreneurPulse.com.au  Fact Sheet for Healthcare Providers:  IncredibleEmployment.be  This test is no t yet approved or cleared by the [place] FDA and  has been authorized for detection and/or  diagnosis of SARS-CoV-2 by FDA under an Emergency Use Authorization (EUA). This EUA will remain  in effect (meaning this test can be used) for the duration of the COVID-19 declaration under Section 564(b)(1) of the Act, 21 U.S.C.section 360bbb-3(b)(1), unless the authorization is terminated  or  revoked sooner.       Influenza A by PCR NEGATIVE NEGATIVE Final   Influenza B by PCR NEGATIVE NEGATIVE Final    Comment: (NOTE) The Xpert Xpress SARS-CoV-2/FLU/RSV plus assay is intended as an aid in the diagnosis of influenza from Nasopharyngeal swab specimens and should not be used as a sole basis for treatment. Nasal washings and aspirates are unacceptable for Xpert Xpress SARS-CoV-2/FLU/RSV testing.  Fact Sheet for Patients: EntrepreneurPulse.com.au  Fact Sheet for Healthcare Providers: IncredibleEmployment.be  This test is not yet approved or cleared by the [place] FDA and has been authorized for detection and/or diagnosis of SARS-CoV-2 by FDA under an Emergency Use Authorization (EUA). This EUA will remain in effect (meaning this test can be used) for the duration of the COVID-19 declaration under Section 564(b)(1) of the Act, 21 U.S.C. section 360bbb-3(b)(1), unless the authorization is terminated or revoked.  Performed at [facility], [address] [place], [place] [address]           Central Line/ continued, requirement due to  Reason to continue [employer] of central venous pressure or other hemodynamic parameters and poor IV access      ASSESSMENT AND PLAN SYNOPSIS  60 yo white male with multiple medical issues with severe and acute systolic CHF exacerbation with shock with progressive cardiorenal syndrome  Cardiogenic shock due to  [place]- EF <20 % admitted to ICU for placement of CVL and milrinone infusion Prognosis is extremely poor    - on milrinone drip and on midodrine per tube.     Morbid obesity, possible OSA.   Will certainly impact respiratory mechanics  ACUTE KIDNEY INJURY/Renal Failure -continue Foley Catheter-assess need -Avoid nephrotoxic agents -Follow urine output, BMP -Ensure adequate renal perfusion, optimize oxygenation -Renal dose  medications   SHOCK-CARDIOGENIC -use vasopressors to keep MAP>65  DIET-->as tolerated Constipation protocol as indicated  ELECTROLYTES -follow labs as needed -replace as needed -pharmacy consultation and following   DVT/GI PRX ordered and assessed TRANSFUSIONS AS NEEDED MONITOR FSBS I Assessed the need for Labs I Assessed the need for Foley I Assessed the need for Central Venous Line Family Discussion when available I Assessed the need for Mobilization I made an Assessment of medications to be adjusted accordingly Safety Risk assessment completed  CASE DISCUSSED IN MULTIDISCIPLINARY ROUNDS WITH ICU TEAM  prognosis is guarded.    Recommend Palliative care for assessment for Hospice Critical care provider statement:    Critical care time (minutes):  33   Critical care time was exclusive of:  Separately billable procedures and  treating other patients   Critical care was necessary to treat or prevent imminent or  life-threatening deterioration of the following conditions:  advanced CHF, cardiogenic shock   Critical care was time spent personally by me on the following  activities:  Development of treatment plan with patient or surrogate,  discussions with consultants, evaluation of patient's response to  treatment, examination of patient, obtaining history from patient or  surrogate, ordering and performing treatments and interventions, ordering  and review of laboratory studies and re-evaluation of patient's condition   I assumed direction of critical care for this patient from another  provider in my  specialty: no     Ottie Glazier, M.D.  Pulmonary & [place]

## 2020-06-25 ENCOUNTER — Inpatient Hospital Stay: Payer: Medicare Other

## 2020-06-25 DIAGNOSIS — I4819 Other persistent atrial fibrillation: Secondary | ICD-10-CM | POA: Diagnosis not present

## 2020-06-25 DIAGNOSIS — I5023 Acute on chronic systolic (congestive) heart failure: Secondary | ICD-10-CM | POA: Diagnosis not present

## 2020-06-25 LAB — BASIC METABOLIC PANEL
Anion gap: 9 (ref 5–15)
BUN: 33 mg/dL — ABNORMAL HIGH (ref 6–20)
CO2: 40 mmol/L — ABNORMAL HIGH (ref 22–32)
Calcium: 8.8 mg/dL — ABNORMAL LOW (ref 8.9–10.3)
Chloride: 87 mmol/L — ABNORMAL LOW (ref 98–111)
Creatinine, Ser: 1.14 mg/dL (ref 0.61–1.24)
GFR, Estimated: >60 mL/min (ref >60)
Glucose, Bld: 151 mg/dL — ABNORMAL HIGH (ref 70–99)
Potassium: 3.7 mmol/L (ref 3.5–5.1)
Sodium: 136 mmol/L (ref 135–145)

## 2020-06-25 LAB — CBC WITH DIFFERENTIAL/PLATELET
Abs Immature Granulocytes: 0.03 10*3/uL (ref 0.00–0.07)
Basophils Absolute: 0 10*3/uL (ref 0.0–0.1)
Basophils Relative: 0 %
Eosinophils Absolute: 0 10*3/uL (ref 0.0–0.5)
Eosinophils Relative: 0 %
HCT: 36.6 % — ABNORMAL LOW (ref 39.0–52.0)
Hemoglobin: 11.8 g/dL — ABNORMAL LOW (ref 13.0–17.0)
Immature Granulocytes: 1 %
Lymphocytes Relative: 9 %
Lymphs Abs: 0.6 10*3/uL — ABNORMAL LOW (ref 0.7–4.0)
MCH: 26.1 pg (ref 26.0–34.0)
MCHC: 32.2 g/dL (ref 30.0–36.0)
MCV: 81 fL (ref 80.0–100.0)
Monocytes Absolute: 0.5 10*3/uL (ref 0.1–1.0)
Monocytes Relative: 8 %
Neutro Abs: 5.3 10*3/uL (ref 1.7–7.7)
Neutrophils Relative %: 82 %
Platelets: 111 10*3/uL — ABNORMAL LOW (ref 150–400)
RBC: 4.52 MIL/uL (ref 4.22–5.81)
RDW: 21.3 % — ABNORMAL HIGH (ref 11.5–15.5)
Smear Review: NORMAL
WBC: 6.4 10*3/uL (ref 4.0–10.5)
nRBC: 0 % (ref 0.0–0.2)

## 2020-06-25 LAB — GLUCOSE, CAPILLARY
Glucose-Capillary: 124 mg/dL — ABNORMAL HIGH (ref 70–99)
Glucose-Capillary: 176 mg/dL — ABNORMAL HIGH (ref 70–99)
Glucose-Capillary: 198 mg/dL — ABNORMAL HIGH (ref 70–99)
Glucose-Capillary: 217 mg/dL — ABNORMAL HIGH (ref 70–99)
Glucose-Capillary: 99 mg/dL (ref 70–99)

## 2020-06-25 LAB — MAGNESIUM: Magnesium: 2 mg/dL (ref 1.7–2.4)

## 2020-06-25 LAB — HEPARIN LEVEL (UNFRACTIONATED): Heparin Unfractionated: 0.37 IU/mL (ref 0.30–0.70)

## 2020-06-25 LAB — PHOSPHORUS: Phosphorus: 2.7 mg/dL (ref 2.5–4.6)

## 2020-06-25 MED ORDER — APIXABAN 5 MG PO TABS
5.0000 mg | ORAL_TABLET | Freq: Two times a day (BID) | ORAL | Status: DC
  Administered 2020-06-25 – 2020-07-01 (×13): 5 mg via ORAL
  Filled 2020-06-25 (×13): qty 1

## 2020-06-25 NOTE — Progress Notes (Signed)
CRITICAL CARE NOTE   59 y.o.malewith a hx of ICM EF 20-25%, CAD/PCI, tobacco abuse, COPD, PAD s/p L SFA stenting, s/p R BKA, afib on Eliquis, OSA noncompliant with CPAP, hx of leaving AMA and noncompliancewho is being seen today for the evaluation ofheart failure.presents to the emergency room with shortness of breath, fluid retention in his legs and intermittent chest pain. He uses albuterol which helped a bit but continues to have dyspnea with exertion. He denies cough, fever or chills  Cardiology Consulted recommend ICU admission for inotrope Nephrology consulted for progressive cardiorenal syndrome-placed on lasix infusion   EVENTS/Hospital Course 1/9 admission to ICU for severe heart failure 1/9 CVL placed 1/10 on milrinone infusion/lasic infusion 1/11 severe end stage CHF, prognosis is poor 1/12 stopped eliquis and plavix 1/14-15/2022 patient on milrinone drip remains in MICU.  He is lucid and able to speak appropriately.  06/24/20- patient awake alert eating breakfast during my evaluation.  Vitals stable , diuresed adequately with lasix and milrinone.    CC  Follow up CHF exacerbation  SUBJECTIVE Alert and awake On minimal oxygen  On milrinone infusion lasix infusion  BP (!) 120/98   Pulse 99   Temp 98.4 F (36.9 C) (Oral)   Resp 18   Ht 5\' 7"  (1.702 m)   Wt 93.6 kg   SpO2 100%   BMI 32.32 kg/m    I/O last 3 completed shifts: In: 1733.2 [P.O.:720; I.V.:1013.2] Out: 5826 [Urine:5825; Stool:1] Total I/O In: 253 [P.O.:240; I.V.:13] Out: 675 [Urine:675]  SpO2: 100 % O2 Flow Rate (L/min): 2.5 L/min  Estimated body mass index is 32.32 kg/m as calculated from the following:   Height as of this encounter: 5\' 7"  (1.702 m).   Weight as of this encounter: 93.6 kg.   Review of Systems:  Gen:  fatigued HEENT: Denies blurred vision, double vision, ear pain, eye pain, hearing loss, nose bleeds, sore throat Cardiac:  No dizziness, chest pain or heaviness,  chest tightness,edema, No JVD Resp:   +SOB Other:  All other systems negative   Physical Examination:   General Appearance: No distress Agre approprate  HEENT: PERRLA, EOM intact.  No goiter Pulmonary: normal breath sounds, No wheezing.  CardiovascularNormal S1,S2.  No m/r/g.   GI: obese  CNS : CN2-12 grossly intact without FND, normally moving extermiteis hx of amputation Psych:  Mood appropirate, patient optimistic   MEDICATIONS: I have reviewed all medications and confirmed regimen as documented   CULTURE RESULTS   Recent Results (from the past 240 hour(s))  Resp Panel by RT-PCR (Flu A&B, Covid)     Status: None   Collection Time: 06/17/20  6:00 AM  Result Value Ref Range Status   SARS Coronavirus 2 by RT PCR NEGATIVE NEGATIVE Final    Comment: (NOTE) SARS-CoV-2 target nucleic acids are NOT DETECTED.  The SARS-CoV-2 RNA is generally detectable in upper respiratory specimens during the acute phase of infection. The lowest concentration of SARS-CoV-2 viral copies this assay can detect is 138 copies/mL. A negative result does not preclude SARS-Cov-2 infection and should not be used as the sole basis for treatment or other patient management decisions. A negative result may occur with  improper specimen collection/handling, submission of specimen other than nasopharyngeal swab, presence of viral mutation(s) within the areas targeted by this assay, and inadequate number of viral copies(<138 copies/mL). A negative result must be combined with clinical observations, patient history, and epidemiological information. The expected result is Negative.  Fact Sheet for Patients:  EntrepreneurPulse.com.au  Fact Sheet for Healthcare Providers:  IncredibleEmployment.be  This test is no t yet approved or cleared by the [place] FDA and  has been authorized for detection and/or diagnosis of SARS-CoV-2 by FDA under an Emergency Use  Authorization (EUA). This EUA will remain  in effect (meaning this test can be used) for the duration of the COVID-19 declaration under Section 564(b)(1) of the Act, 21 U.S.C.section 360bbb-3(b)(1), unless the authorization is terminated  or revoked sooner.       Influenza A by PCR NEGATIVE NEGATIVE Final   Influenza B by PCR NEGATIVE NEGATIVE Final    Comment: (NOTE) The Xpert Xpress SARS-CoV-2/FLU/RSV plus assay is intended as an aid in the diagnosis of influenza from Nasopharyngeal swab specimens and should not be used as a sole basis for treatment. Nasal washings and aspirates are unacceptable for Xpert Xpress SARS-CoV-2/FLU/RSV testing.  Fact Sheet for Patients: EntrepreneurPulse.com.au  Fact Sheet for Healthcare Providers: IncredibleEmployment.be  This test is not yet approved or cleared by the [place] FDA and has been authorized for detection and/or diagnosis of SARS-CoV-2 by FDA under an Emergency Use Authorization (EUA). This EUA will remain in effect (meaning this test can be used) for the duration of the COVID-19 declaration under Section 564(b)(1) of the Act, 21 U.S.C. section 360bbb-3(b)(1), unless the authorization is terminated or revoked.  Performed at [facility], [address] [place], [place] [address]           Central Line/ continued, requirement due to  Reason to continue [employer] of central venous pressure or other hemodynamic parameters and poor IV access      ASSESSMENT AND PLAN SYNOPSIS  60 yo white male with multiple medical issues with severe and acute systolic CHF exacerbation with shock with progressive cardiorenal syndrome  Cardiogenic shock due to  [place]- EF <20 % admitted to ICU for placement of CVL and milrinone infusion Prognosis is extremely poor    - on milrinone drip and on midodrine per tube.     Morbid obesity, possible OSA.    Will certainly impact respiratory mechanics  ACUTE KIDNEY INJURY/Renal Failure -continue Foley Catheter-assess need -Avoid nephrotoxic agents -Follow urine output, BMP -Ensure adequate renal perfusion, optimize oxygenation -Renal dose medications   SHOCK-CARDIOGENIC -use vasopressors to keep MAP>65  DIET-->as tolerated Constipation protocol as indicated  ELECTROLYTES -follow labs as needed -replace as needed -pharmacy consultation and following   DVT/GI PRX ordered and assessed TRANSFUSIONS AS NEEDED MONITOR FSBS I Assessed the need for Labs I Assessed the need for Foley I Assessed the need for Central Venous Line Family Discussion when available I Assessed the need for Mobilization I made an Assessment of medications to be adjusted accordingly Safety Risk assessment completed  CASE DISCUSSED IN MULTIDISCIPLINARY ROUNDS WITH ICU TEAM  prognosis is guarded.    Recommend Palliative care for assessment for Hospice Critical care provider statement:    Critical care time (minutes):  33   Critical care time was exclusive of:  Separately billable procedures and  treating other patients   Critical care was necessary to treat or prevent imminent or  life-threatening deterioration of the following conditions:  advanced CHF, cardiogenic shock   Critical care was time spent personally by me on the following  activities:  Development of treatment plan with patient or surrogate,  discussions with consultants, evaluation of patient's response to  treatment, examination of patient, obtaining history from patient or  surrogate, ordering and performing treatments and  interventions, ordering  and review of laboratory studies and re-evaluation of patient's condition   I assumed direction of critical care for this patient from another  provider in my specialty: no     Ottie Glazier, M.D.  Pulmonary & [place]

## 2020-06-25 NOTE — Consult Note (Signed)
I have placed a request via Secure Chat to Dr. Mortimer Fries requesting photos of the wound areas of concern to be placed in the EMR.    [place], [place], [place]

## 2020-06-25 NOTE — Progress Notes (Signed)
Patient watched TV and ate all day. Patient refused to turn but did elevate left leg. Patient has pre-eisting diabetic ulcer on left lower leg. Consult to wound nurse made for treatment.

## 2020-06-25 NOTE — Progress Notes (Signed)
[place] for heparin Indication: atrial fibrillation  Allergies  Allergen Reactions  . Dulaglutide Anaphylaxis, Diarrhea and Hives  . Other Itching    Skin itching associated with nitro patch  . Prednisone Rash    Patient Measurements: Height: 5\' 7"  (170.2 cm) Weight: 93.6 kg (206 lb 5.6 oz) IBW/kg (Calculated) : 66.1 Heparin Dosing Weight: 88 kg  Vital Signs: Temp: 98.4 F (36.9 C) (01/15 2000) Temp Source: Oral (01/15 2000) BP: 121/83 (01/15 2300) Pulse Rate: 101 (01/15 2300)  Labs: Recent Labs    06/22/20 1241 06/22/20 1241 06/22/20 2128 06/23/20 0339 06/24/20 0508 06/25/20 0347  HGB  --    < >  --  12.2* 11.6* 11.8*  HCT  --   --   --  36.4* 33.9* 36.6*  PLT  --   --   --  116* 108* 111*  APTT 26  --  83* 84*  --   --   LABPROT 15.3*  --   --   --   --   --   INR 1.3*  --   --   --   --   --   HEPARINUNFRC 0.40  --   --  0.58 0.61 0.37  CREATININE  --   --   --  1.11 1.04 1.14   < > = values in this interval not displayed.    Estimated Creatinine Clearance: 78 mL/min (by C-G formula based on SCr of 1.14 mg/dL).   Medical History: Past Medical History:  Diagnosis Date  . Arthritis   . Asthma   . Atherosclerosis   . BPH (benign prostatic hyperplasia)   . Carotid arterial disease ([place])   . Charcot's joint of foot, right   . Chronic combined systolic (congestive) and diastolic (congestive) heart failure ([place])    a. 02/2013 EF 50% by LV gram; b. 04/2017 Echo: EF 25-30%. diff HK. Gr2 DD. Mod dil LA/RV. PASP 10mmHg.  [name] COPD (chronic obstructive pulmonary disease) ([place])   . Coronary artery disease    a. 2013 S/P PCI of LAD [facility]);  b. 03/2013 PCI: RCA 90p ( 2.5 x 23 mm DES); c. 02/2014 Cath: patent RCA stent->Med Rx; d. 04/2017 Cath: LM nl, LAd 30ost/p, 29m/d, D1 80ost, LCX 90p/m, OM1 85, RCA 70/20p, 84m, 70d->Referred for CT Surg-felt to be poor candidate.  . Diabetic neuropathy ([place])   . Diabetic ulcer of right foot  ([place])   . Hernia   . Hyperlipidemia   . Hypertension   . Ischemic cardiomyopathy    a. 04/2017 Echo: EF 25-30%; b. 08/2017 Cardiac MRI ([facility]): EF 19%, sev glob HK. RVEF 20%, mod BAE, triv MR, mild-mod TR. Basal lateral subendocardial infarct (viable), inf/infsept, dist septal ischemia, basal to mid lat peri-infarct ischemia.  . Morbid obesity ([place])   . Neuropathy   . PAD (peripheral artery disease) ([place])    a. Followed by Dr. Lucky Cowboy; b. 08/2010 Periph Angio: RSFA 70-80p (6X60 self-expanding stent); c. 09/2010 Periph Angio: L SFA 100p (7x unknown length self-expanding stent); d. 06/2015 Periph Angio: R SFA short segment occlusion (6x12 self-expanding stent).  . Restless leg syndrome   . Sleep apnea   . Subclavian artery stenosis, right ([place])   . Syncope and collapse   . Tobacco use    a. 75+ yr hx - still smoking 1ppd, down from 2 ppd.  . Type II diabetes mellitus ([place])   . Varicose vein    Assessment: 60 year old male with  HFrEF, EF < 20% in the ICU for furosemide and milrinone drips. Patient also has h/o afib on Eliquis, which was held for some neck pain/bruising at the site of his central line. This has improved. Plan for heparin drip per Cardiology.   Goal of Therapy:  HL 0.3 - 0.7 APTT 66 - 102 sec Monitor platelets by anticoagulation protocol: Yes   Plan:  1/13: HL @ 1241 (baseline) = 0.4, aPTT @ 1241 = 26  1/13: aPTT @ 2128 = 83  Will continue pt on current rate and draw confirmation aPTT on 1/14 @ 0330. Will recheck HL on 1/14 @ 0330.  Will use aPTT until HL and aPTT are therapeutic.    0115 0508 HL 0.61, therapeutic x 3. Will use HL to monitor Heparin infusion.  CBC still pending. Platelets trending down some yesterday, H/H stable.  Will continue Heparin at current rate.  Continue to monitor CBC, PLTs  0116 0347 HL 0.37, therapeutic x 4. CBC low but stable.  Will continue Heparin at current rate and recheck HL and CBC tomorrow am.   Ena Dawley, PharmD 06/25/2020,5:32  AM

## 2020-06-25 NOTE — Progress Notes (Signed)
Progress Note  Patient Name: [name] [name] Date of Encounter: 06/25/2020  [facility] Cardiologist: Ida Rogue, MD   Subjective   Sleeping comfortably, no acute events overnight, diuresing well.  Net -3.3 L over the past 24 hours.  Blood pressure stable.  Inpatient Medications    Scheduled Meds: . atorvastatin  80 mg Oral Daily  . Chlorhexidine Gluconate Cloth  6 each Topical Daily  . digoxin  0.125 mg Oral Daily  . DULoxetine  30 mg Oral Daily  . ezetimibe  10 mg Oral Daily  . gabapentin  400 mg Oral QID  . insulin aspart  0-20 Units Subcutaneous Q4H  . methylPREDNISolone (SOLU-MEDROL) injection  40 mg Intravenous Daily  . midodrine  10 mg Oral TID WC  . mometasone-formoterol  2 puff Inhalation BID  . multivitamin with minerals  1 tablet Oral Daily  . pantoprazole  40 mg Oral Daily  . sodium chloride flush  3 mL Intravenous Q12H  . spironolactone  12.5 mg Oral Daily  . tamsulosin  0.4 mg Oral QPC supper  . traZODone  150 mg Oral QHS  . umeclidinium bromide  1 puff Inhalation Daily   Continuous Infusions: . sodium chloride Stopped (06/24/20 1400)  . furosemide (LASIX) 200 mg in dextrose 5% 100 mL (2mg /mL) infusion 6 mg/hr (06/24/20 2041)  . heparin 1,250 Units/hr (06/24/20 1700)  . milrinone 0.125 mcg/kg/min (06/24/20 1700)   PRN Meds: sodium chloride, acetaminophen, Ipratropium-Albuterol, ipratropium-albuterol, nitroGLYCERIN, ondansetron (ZOFRAN) IV, oxyCODONE, sodium chloride flush   Vital Signs    Vitals:   06/25/20 0500 06/25/20 0600 06/25/20 0700 06/25/20 0800  BP: (!) 123/95 130/82 109/80 (!) 120/98  Pulse: 72 63 76 61  Resp: 17 16 13 18   Temp:      TempSrc:      SpO2: 93% (!) 87% (!) 87% 100%  Weight:      Height:        Intake/Output Summary (Last 24 hours) at 06/25/2020 0838 Last data filed at 06/25/2020 0724 Gross per 24 hour  Intake 954.85 ml  Output 4951 ml  Net -3996.15 ml   Last 3 Weights 06/23/2020 06/21/2020 06/20/2020  Weight (lbs)  206 lb 5.6 oz 226 lb 10.1 oz 226 lb 6.6 oz  Weight (kg) 93.6 kg 102.8 kg 102.7 kg  Some encounter information is confidential and restricted. Go to Review Flowsheets activity to see all data.      Telemetry    Atrial fibrillation, heart rate 95- Personally Reviewed  ECG    No new tracing- Personally Reviewed  Physical Exam   GEN: Well nourished, well developed in no acute distress  HEENT: Normal  NECK: No JVD; No carotid bruits  CARDIAC: Irregular irregular  RESPIRATORY:  Clear to auscultation without rales, wheezing or rhonchi   ABDOMEN: Soft, non-tender, non-distended  MUSCULOSKELETAL: Right BKA noted, left ankle wrapped in dressing.  Erythema.  SKIN: Warm and dry  NEUROLOGIC: Alert and oriented x 3  PSYCHIATRIC:  Normal affect    Labs    High Sensitivity Troponin:   Recent Labs  Lab 06/17/20 0049 06/17/20 0420  TROPONINIHS 39* 54*      Chemistry Recent Labs  Lab 06/23/20 0339 06/24/20 0508 06/25/20 0347  NA 136 136 136  K 3.8 3.8 3.7  CL 89* 88* 87*  CO2 39* 39* 40*  GLUCOSE 148* 91 151*  BUN 34* 35* 33*  CREATININE 1.11 1.04 1.14  CALCIUM 8.3* 8.8* 8.8*  GFRNONAA >60 >60 >60  ANIONGAP 8  9 9     Hematology Recent Labs  Lab 06/23/20 0339 06/24/20 0508 06/25/20 0347  WBC 6.3 6.1 6.4  RBC 4.50 4.19* 4.52  HGB 12.2* 11.6* 11.8*  HCT 36.4* 33.9* 36.6*  MCV 80.9 80.9 81.0  MCH 27.1 27.7 26.1  MCHC 33.5 34.2 32.2  RDW 21.9* 21.2* 21.3*  PLT 116* 108* 111*    BNPNo results for input(s): BNP, PROBNP in the last 168 hours.   DDimer No results for input(s): DDIMER in the last 168 hours.   Radiology    No results found.  Cardiac Studies   Echo 06/2020 1. Left ventricular ejection fraction, by estimation, is <20%. Left  ventricular ejection fraction by PLAX is 14 %. The left ventricle has  severely decreased function. The left ventricle demonstrates global  hypokinesis. The left ventricular internal  cavity size was severely dilated.  Left ventricular diastolic parameters  are indeterminate.  2. Right ventricular systolic function is severely reduced. The right  ventricular size is moderately enlarged. There is severely elevated  pulmonary artery systolic pressure. The estimated right ventricular  systolic pressure is 16.1 mmHg.  3. Left atrial size was severely dilated.  4. Right atrial size was severely dilated.  5. Tricuspid valve regurgitation is mild to moderate.  6. The inferior vena cava is dilated in size with <50% respiratory  variability, suggesting right atrial pressure of 15 mmHg.   Patient Profile     60 y.o. male with history of CAD/PCI, HFrEF EF less than 20%, persistent A. fib, PAD, right BKA, OSA, COPD presenting with shortness of breath.  Being seen for HFrEF and volume overload.     Assessment & Plan    1.  HFrEF, EF less than 20%  -Net -3.3 L over the past 24 hours  -Continue with Lasix drip 6 mg/h, milrinone.  -Creatinine normal Aldactone  -CHF meds after adequate diuresing    2. persistent A. Fib  -Heart rate reasonably controlled  -Stop heparin to limit fluid intake -start Eliquis, continue digoxin    3. CAD/PCI, PAD  -Eliquis, Lipitor, Zetia.    Greater than 50% was spent in counseling and coordination of care with patient  Total encounter time 35 minutes          Signed, Kate Sable, MD  06/25/2020, 8:38 AM

## 2020-06-26 ENCOUNTER — Ambulatory Visit: Payer: Medicare Other

## 2020-06-26 ENCOUNTER — Ambulatory Visit: Payer: Medicare Other | Admitting: Cardiovascular Disease

## 2020-06-26 DIAGNOSIS — I5023 Acute on chronic systolic (congestive) heart failure: Secondary | ICD-10-CM | POA: Diagnosis not present

## 2020-06-26 LAB — GLUCOSE, CAPILLARY
Glucose-Capillary: 157 mg/dL — ABNORMAL HIGH (ref 70–99)
Glucose-Capillary: 135 mg/dL — ABNORMAL HIGH (ref 70–99)
Glucose-Capillary: 176 mg/dL — ABNORMAL HIGH (ref 70–99)
Glucose-Capillary: 262 mg/dL — ABNORMAL HIGH (ref 70–99)
Glucose-Capillary: 264 mg/dL — ABNORMAL HIGH (ref 70–99)
Glucose-Capillary: 126 mg/dL — ABNORMAL HIGH (ref 70–99)

## 2020-06-26 LAB — PHOSPHORUS: Phosphorus: 3.1 mg/dL (ref 2.5–4.6)

## 2020-06-26 LAB — CBC WITH DIFFERENTIAL/PLATELET
Abs Immature Granulocytes: 0.05 10*3/uL (ref 0.00–0.07)
Basophils Absolute: 0 10*3/uL (ref 0.0–0.1)
Basophils Relative: 0 %
Eosinophils Absolute: 0 10*3/uL (ref 0.0–0.5)
Eosinophils Relative: 0 %
HCT: 37.1 % — ABNORMAL LOW (ref 39.0–52.0)
Hemoglobin: 12.4 g/dL — ABNORMAL LOW (ref 13.0–17.0)
Immature Granulocytes: 1 %
Lymphocytes Relative: 7 %
Lymphs Abs: 0.6 10*3/uL — ABNORMAL LOW (ref 0.7–4.0)
MCH: 27.1 pg (ref 26.0–34.0)
MCHC: 33.4 g/dL (ref 30.0–36.0)
MCV: 81 fL (ref 80.0–100.0)
Monocytes Absolute: 0.7 10*3/uL (ref 0.1–1.0)
Monocytes Relative: 8 %
Neutro Abs: 7.2 10*3/uL (ref 1.7–7.7)
Neutrophils Relative %: 84 %
Platelets: 115 10*3/uL — ABNORMAL LOW (ref 150–400)
RBC: 4.58 MIL/uL (ref 4.22–5.81)
RDW: 21 % — ABNORMAL HIGH (ref 11.5–15.5)
WBC: 8.5 10*3/uL (ref 4.0–10.5)
nRBC: 0 % (ref 0.0–0.2)

## 2020-06-26 LAB — BASIC METABOLIC PANEL
Anion gap: 7 (ref 5–15)
BUN: 34 mg/dL — ABNORMAL HIGH (ref 6–20)
CO2: 39 mmol/L — ABNORMAL HIGH (ref 22–32)
Calcium: 9 mg/dL (ref 8.9–10.3)
Chloride: 88 mmol/L — ABNORMAL LOW (ref 98–111)
Creatinine, Ser: 1.13 mg/dL (ref 0.61–1.24)
GFR, Estimated: >60 mL/min (ref >60)
Glucose, Bld: 155 mg/dL — ABNORMAL HIGH (ref 70–99)
Potassium: 3.9 mmol/L (ref 3.5–5.1)
Sodium: 134 mmol/L — ABNORMAL LOW (ref 135–145)

## 2020-06-26 LAB — MAGNESIUM: Magnesium: 2 mg/dL (ref 1.7–2.4)

## 2020-06-26 MED ORDER — POTASSIUM CHLORIDE CRYS ER 20 MEQ PO TBCR
40.0000 meq | EXTENDED_RELEASE_TABLET | Freq: Once | ORAL | Status: AC
  Administered 2020-06-26: 40 meq via ORAL
  Filled 2020-06-26: qty 2

## 2020-06-26 MED ORDER — FUROSEMIDE 10 MG/ML IJ SOLN
60.0000 mg | Freq: Two times a day (BID) | INTRAMUSCULAR | Status: DC
  Administered 2020-06-26 – 2020-06-30 (×8): 60 mg via INTRAVENOUS
  Filled 2020-06-26 (×8): qty 6

## 2020-06-26 MED ORDER — MIDODRINE HCL 5 MG PO TABS
2.5000 mg | ORAL_TABLET | Freq: Three times a day (TID) | ORAL | Status: DC
  Administered 2020-06-26 – 2020-06-28 (×7): 2.5 mg via ORAL
  Filled 2020-06-26 (×7): qty 1

## 2020-06-26 NOTE — Progress Notes (Signed)
Shift note: Pt A&OX4. Pleasant, cooperative and calm. VS stable, denies SOB or chest pain. C/o moderate chronic pain to his left lower leg. Voiced relief with PRN oxy.  CBG's checked every 4 hours. 135, and 176 thus far this shift. Educated on the importance of compliance with his cardiac and carb modified diet. Voices understanding and says that he tries to be compliant but he needs more reinforcement. Encouraged to decrease diet soda consumption and replace with water.  02 titrated down from 3.5 to 2LPM via n/c. Sats 98%at this time. Will continue to monitor.

## 2020-06-26 NOTE — Progress Notes (Signed)
NAME:  [name] [name], MRN:  [id number], DOB:  1961-02-26, LOS: 9 ADMISSION DATE:  06/17/2020, CONSULTATION DATE:  06/17/2020 REFERRING MD:  Maren [place], CHIEF COMPLAINT:  Cardiogenic Shock  Brief History:  60 y.o.malewith a hx of ICM EF 20-25%, CAD/PCI, tobacco abuse, COPD, PAD s/p L SFA stenting, s/p R BKA, afib on Eliquis, OSA noncompliant with CPAP, hx of leaving AMA and noncompliancewho is being seen today for the evaluation ofheart failure.presents to the emergency room with shortness of breath, fluid retention in his legs and intermittent chest pain.  Admitted to the ICU for milrinone drip and lasix drip.   Past Medical History:  He,  has a past medical history of Arthritis, Asthma, Atherosclerosis, BPH (benign prostatic hyperplasia), Carotid arterial disease ([place]), Charcot's joint of foot, right, Chronic combined systolic (congestive) and diastolic (congestive) heart failure ([place]), COPD (chronic obstructive pulmonary disease) ([place]), Coronary artery disease, Diabetic neuropathy ([place]), Diabetic ulcer of right foot ([place]), Hernia, Hyperlipidemia, Hypertension, Ischemic cardiomyopathy, Morbid obesity ([place]), Neuropathy, PAD (peripheral artery disease) ([place]), Restless leg syndrome, Sleep apnea, Subclavian artery stenosis, right ([place]), Syncope and collapse, Tobacco use, Type II diabetes mellitus ([place]), and Varicose vein.   Significant Hospital Events:  1/9 admission to ICU for severe heart failure 1/9 CVL placed 1/10 on milrinone infusion/lasic infusion 1/11 severe end stage CHF, prognosis is poor 1/12 stopped eliquis and plavix 1/14-15/2022 patient on milrinone drip remains in MICU.  He is lucid and able to speak appropriately.  06/24/20- patient awake alert eating breakfast during my evaluation.  Vitals stable , diuresed adequately with lasix and milrinone.   Consults:  Cardiology Nephrology  Procedures:  CVL 06/18/2020  Significant Diagnostic Tests:  Echo 06/17/2020 1. Left ventricular  ejection fraction, by estimation, is <20%. Left  ventricular ejection fraction by PLAX is 14 %. The left ventricle has  severely decreased function. The left ventricle demonstrates global  hypokinesis. The left ventricular internal  cavity size was severely dilated. Left ventricular diastolic parameters  are indeterminate.  2. Right ventricular systolic function is severely reduced. The right  ventricular size is moderately enlarged. There is severely elevated  pulmonary artery systolic pressure. The estimated right ventricular  systolic pressure is 67.6 mmHg.  3. Left atrial size was severely dilated.  4. Right atrial size was severely dilated.  5. Tricuspid valve regurgitation is mild to moderate.  6. The inferior vena cava is dilated in size with <50% respiratory  variability, suggesting right atrial pressure of 15 mmHg.   Micro Data:    Antimicrobials:    Interim History / Subjective:  No acute events overnight. Patient resting comfortably in bed without complaints.  Objective   Blood pressure 109/70, pulse 69, temperature 98.8 F (37.1 C), temperature source Oral, resp. rate 12, height 5\' 7"  (1.702 m), weight 93.6 kg, SpO2 100 %. CVP:  [5 mmHg-40 mmHg] 5 mmHg      Intake/Output Summary (Last 24 hours) at 06/26/2020 0946 Last data filed at 06/26/2020 0650 Gross per 24 hour  Intake 1911.14 ml  Output 4125 ml  Net -2213.86 ml   Filed Weights   06/20/20 0500 06/21/20 0500 06/23/20 0419  Weight: 102.7 kg 102.8 kg 93.6 kg    Examination: General: chronically ill appearing male, no acute distress HENT: [place]/AT, moist mucous membranes Lungs: clear to auscultation Cardiovascular: RRR, s1s2 Abdomen: soft, BS+, non-distended, non-tender Extremities: right BKA, left lower extremity with blister wound Neuro: A&Ox3   Resolved Hospital Problem list     Assessment & Plan:  60  yo white male with multiple medical issues with severe and acute systolic CHF exacerbation with  shock with progressive cardiorenal syndrome  Cardiogenic shock In setting of acute systolic heart failure, EF <20% - Milrinone weaned off today - Cardiology following - Lasix drip transitioned to 60mg  IV lasix twice daily - Continue spironolactone and digoxin - Continue midodrine  Acute Kidney Injury In setting of heart failure - Improved   Atrial Fibrillation - continue eliquis  CAD Continue lipitor, zetia  Hx of COPD Continue incruse and dulera inhaler therapy Stop solumedrol 40mg  daily today  Best practice (evaluated daily)  Diet: Cardiac Pain/Anxiety/Delirium protocol (if indicated): n/a VAP protocol (if indicated): n/a DVT prophylaxis: eliquis GI prophylaxis: PPI Glucose control: SSI Mobility: bed rest Disposition: Transfer out of ICU Code Status: DNR  Labs   CBC: Recent Labs  Lab 06/22/20 0500 06/23/20 0339 06/24/20 0508 06/25/20 0347 06/26/20 0514  WBC 6.5 6.3 6.1 6.4 8.5  NEUTROABS 5.1 5.2 4.8 5.3 7.2  HGB 11.8* 12.2* 11.6* 11.8* 12.4*  HCT 36.2* 36.4* 33.9* 36.6* 37.1*  MCV 81.9 80.9 80.9 81.0 81.0  PLT 126* 116* 108* 111* 115*    Basic Metabolic Panel: Recent Labs  Lab 06/22/20 0500 06/22/20 2353 06/23/20 0339 06/24/20 0508 06/25/20 0347 06/26/20 0514  NA 136  --  136 136 136 134*  K 3.7 3.8 3.8 3.8 3.7 3.9  CL 89*  --  89* 88* 87* 88*  CO2 39*  --  39* 39* 40* 39*  GLUCOSE 100*  --  148* 91 151* 155*  BUN 35*  --  34* 35* 33* 34*  CREATININE 1.11  --  1.11 1.04 1.14 1.13  CALCIUM 9.1  --  8.3* 8.8* 8.8* 9.0  MG 1.9 1.8 2.3 2.0 2.0 2.0  PHOS 3.0 2.7 2.6 2.6 2.7 3.1   GFR: Estimated Creatinine Clearance: 78.7 mL/min (by C-G formula based on SCr of 1.13 mg/dL). Recent Labs  Lab 06/23/20 0339 06/24/20 0508 06/25/20 0347 06/26/20 0514  WBC 6.3 6.1 6.4 8.5    Liver Function Tests: No results for input(s): AST, ALT, ALKPHOS, BILITOT, PROT, ALBUMIN in the last 168 hours. No results for input(s): LIPASE, AMYLASE in the last 168  hours. No results for input(s): AMMONIA in the last 168 hours.  ABG    Component Value Date/Time   HCO3 36.4 (H) 08/30/2019 2158   O2SAT 79.2 06/21/2020 0602     Coagulation Profile: Recent Labs  Lab 06/22/20 1241  INR 1.3*    Cardiac Enzymes: No results for input(s): CKTOTAL, CKMB, CKMBINDEX, TROPONINI in the last 168 hours.  HbA1C: Hemoglobin A1C  Date/Time Value Ref Range Status  04/04/2013 04:16 AM 9.8 (H) 4.2 - 6.3 % Final    Comment:    The American Diabetes Association recommends that a primary goal of therapy should be <7% and that physicians should reevaluate the treatment regimen in patients with HbA1c values consistently >8%.    Hgb A1c MFr Bld  Date/Time Value Ref Range Status  06/17/2020 06:00 AM 6.1 (H) 4.8 - 5.6 % Final    Comment:    (NOTE) Pre diabetes:          5.7%-6.4%  Diabetes:              >6.4%  Glycemic control for   <7.0% adults with diabetes   01/19/2020 01:15 PM 5.8 (H) 4.8 - 5.6 % Final    Comment:    (NOTE)         Prediabetes:  5.7 - 6.4         Diabetes: >6.4         Glycemic control for adults with diabetes: <7.0     CBG: Recent Labs  Lab 06/25/20 0722 06/25/20 1104 06/25/20 1606 06/26/20 0508 06/26/20 0804  GLUCAP 124* 99 217* 157* 135*     Critical care time: [place], MD [facility] Office: [phone]   See Amion for Pager Details

## 2020-06-26 NOTE — Consult Note (Signed)
WOC Nurse Consult Note: Reason for Consult: leg ulcer Wound type: vasculitic type ulcer; does not present with typical venous stasis Pressure Injury POA: NA Measurement: 6cm x 4cm x 0.2 Wound bed:100% granulation Drainage (amount, consistency, odor) minimal, serosanguinous  Periwound: intact; painful at ulceration Dressing procedure/placement/frequency: Patient followed by vascular center; goes in every 2 weeks per patient to have Unna's boot applied/changed.  Applied Unna's boot to the LLE directly over ulceration.  Change weekly by [facility] nurse on Mondays  [place] Nurse team will follow with you and see patient within 10 days for wound assessments.  Please notify [place] nurses of any acute changes in the wounds or any new areas of concern [place] MSN, [place], [place], [place]

## 2020-06-26 NOTE — Progress Notes (Signed)
FYI: Pt A&OX4. Declines to set up secure password for this stay, has given verbal permission to give his significant other Pamella Pert updates on his condition.

## 2020-06-26 NOTE — Consult Note (Signed)
I have placed a request via Secure Chat to Dr. Erin Fulling  requesting photos of the wound areas of concern to be placed in the EMR.    [place], [place], [place]

## 2020-06-26 NOTE — Progress Notes (Signed)
Progress Note  Patient Name: [name] [name] Date of Encounter: 06/26/2020  [facility] Cardiologist: Ida Rogue, MD   Subjective   He feels significantly better with less shortness of breath and edema. No chest pain.  He is -23.8 L for the admission.  Inpatient Medications    Scheduled Meds: . apixaban  5 mg Oral BID  . atorvastatin  80 mg Oral Daily  . Chlorhexidine Gluconate Cloth  6 each Topical Daily  . digoxin  0.125 mg Oral Daily  . DULoxetine  30 mg Oral Daily  . ezetimibe  10 mg Oral Daily  . gabapentin  400 mg Oral QID  . insulin aspart  0-20 Units Subcutaneous Q4H  . methylPREDNISolone (SOLU-MEDROL) injection  40 mg Intravenous Daily  . midodrine  10 mg Oral TID WC  . mometasone-formoterol  2 puff Inhalation BID  . multivitamin with minerals  1 tablet Oral Daily  . pantoprazole  40 mg Oral Daily  . sodium chloride flush  3 mL Intravenous Q12H  . spironolactone  12.5 mg Oral Daily  . tamsulosin  0.4 mg Oral QPC supper  . traZODone  150 mg Oral QHS  . umeclidinium bromide  1 puff Inhalation Daily   Continuous Infusions: . sodium chloride Stopped (06/24/20 1400)  . furosemide (LASIX) 200 mg in dextrose 5% 100 mL (2mg /mL) infusion 6 mg/hr (06/26/20 0650)  . milrinone 0.125 mcg/kg/min (06/26/20 0650)   PRN Meds: sodium chloride, acetaminophen, Ipratropium-Albuterol, ipratropium-albuterol, nitroGLYCERIN, ondansetron (ZOFRAN) IV, oxyCODONE, sodium chloride flush   Vital Signs    Vitals:   06/26/20 0400 06/26/20 0500 06/26/20 0600 06/26/20 0951  BP: 116/73 129/79 109/70   Pulse: 77 81 69 95  Resp: 18 14 12    Temp:      TempSrc:      SpO2: 99% 100% 100%   Weight:      Height:        Intake/Output Summary (Last 24 hours) at 06/26/2020 1047 Last data filed at 06/26/2020 0650 Gross per 24 hour  Intake 1911.14 ml  Output 4125 ml  Net -2213.86 ml   Last 3 Weights 06/23/2020 06/21/2020 06/20/2020  Weight (lbs) 206 lb 5.6 oz 226 lb 10.1 oz 226 lb 6.6 oz   Weight (kg) 93.6 kg 102.8 kg 102.7 kg  Some encounter information is confidential and restricted. Go to Review Flowsheets activity to see all data.      Telemetry    Atrial fibrillation with PVCs, heart rate 95- Personally Reviewed  ECG    No new tracing- Personally Reviewed  Physical Exam   GEN: Well nourished, well developed in no acute distress  HEENT: Normal  NECK: No JVD; No carotid bruits  CARDIAC: Irregular irregular  RESPIRATORY:  Clear to auscultation without rales, wheezing or rhonchi   ABDOMEN: Soft, non-tender, non-distended  MUSCULOSKELETAL: Right BKA noted, left ankle wrapped in dressing.  Erythema.  SKIN: Warm and dry  NEUROLOGIC: Alert and oriented x 3  PSYCHIATRIC:  Normal affect    Labs    High Sensitivity Troponin:   Recent Labs  Lab 06/17/20 0049 06/17/20 0420  TROPONINIHS 39* 54*      Chemistry Recent Labs  Lab 06/24/20 0508 06/25/20 0347 06/26/20 0514  NA 136 136 134*  K 3.8 3.7 3.9  CL 88* 87* 88*  CO2 39* 40* 39*  GLUCOSE 91 151* 155*  BUN 35* 33* 34*  CREATININE 1.04 1.14 1.13  CALCIUM 8.8* 8.8* 9.0  GFRNONAA >60 >60 >60  ANIONGAP 9  9 7     Hematology Recent Labs  Lab 06/24/20 0508 06/25/20 0347 06/26/20 0514  WBC 6.1 6.4 8.5  RBC 4.19* 4.52 4.58  HGB 11.6* 11.8* 12.4*  HCT 33.9* 36.6* 37.1*  MCV 80.9 81.0 81.0  MCH 27.7 26.1 27.1  MCHC 34.2 32.2 33.4  RDW 21.2* 21.3* 21.0*  PLT 108* 111* 115*    BNPNo results for input(s): BNP, PROBNP in the last 168 hours.   DDimer No results for input(s): DDIMER in the last 168 hours.   Radiology    DG Chest Port 1 View  Result Date: 06/25/2020 CLINICAL DATA:  Shortness of breath. EXAM: PORTABLE CHEST 1 VIEW COMPARISON:  June 18, 2020. FINDINGS: Stable cardiomegaly with central pulmonary vascular congestion. No pneumothorax is noted. Left internal jugular catheter is noted with distal tip in expected position of left brachiocephalic vein. Mild bibasilar atelectasis or  edema is noted with minimal pleural effusions. Bony thorax is unremarkable. IMPRESSION: Stable cardiomegaly with central pulmonary vascular congestion. Mild bibasilar atelectasis or edema is noted with minimal pleural effusions. Electronically Signed   By: Marijo Conception M.D.   On: 06/25/2020 13:07    Cardiac Studies   Echo 06/2020 1. Left ventricular ejection fraction, by estimation, is <20%. Left  ventricular ejection fraction by PLAX is 14 %. The left ventricle has  severely decreased function. The left ventricle demonstrates global  hypokinesis. The left ventricular internal  cavity size was severely dilated. Left ventricular diastolic parameters  are indeterminate.  2. Right ventricular systolic function is severely reduced. The right  ventricular size is moderately enlarged. There is severely elevated  pulmonary artery systolic pressure. The estimated right ventricular  systolic pressure is 16.1 mmHg.  3. Left atrial size was severely dilated.  4. Right atrial size was severely dilated.  5. Tricuspid valve regurgitation is mild to moderate.  6. The inferior vena cava is dilated in size with <50% respiratory  variability, suggesting right atrial pressure of 15 mmHg.   Patient Profile     60 y.o. male with history of CAD/PCI, HFrEF EF less than 20%, persistent A. fib, PAD, right BKA, OSA, COPD presenting with shortness of breath.  Being seen for HFrEF and volume overload.     Assessment & Plan    1.  HFrEF, EF less than 20%  -Is negative almost 24 L for the admissions.  His edema has improved significantly. -I'm going to stop milrinone today and switch furosemide drip to furosemide 60 mg intravenously twice daily. -Continue spironolactone and digoxin. -Consider adding small dose carvedilol tomorrow. I am going to try to wean him off midodrine given his heart failure.   2. persistent A. Fib  -Heart rate reasonably controlled  -Digoxin was started during this  admission. Continue anticoagulation with Eliquis.   3. CAD/PCI, PAD  -Eliquis, Lipitor, Zetia.  No anginal symptoms at the present time.  The patient has known diffuse three-vessel disease not amenable to revascularization.  4.  Peripheral arterial disease status post right BKA.   Greater than 50% was spent in counseling and coordination of care with patient  Total encounter time 35 minutes          Signed, [name], MD  06/26/2020, 10:47 AM

## 2020-06-27 ENCOUNTER — Ambulatory Visit: Payer: Medicare Other | Admitting: Cardiovascular Disease

## 2020-06-27 DIAGNOSIS — I5023 Acute on chronic systolic (congestive) heart failure: Secondary | ICD-10-CM | POA: Diagnosis not present

## 2020-06-27 LAB — GLUCOSE, CAPILLARY
Glucose-Capillary: 102 mg/dL — ABNORMAL HIGH (ref 70–99)
Glucose-Capillary: 108 mg/dL — ABNORMAL HIGH (ref 70–99)
Glucose-Capillary: 182 mg/dL — ABNORMAL HIGH (ref 70–99)
Glucose-Capillary: 133 mg/dL — ABNORMAL HIGH (ref 70–99)
Glucose-Capillary: 135 mg/dL — ABNORMAL HIGH (ref 70–99)
Glucose-Capillary: 108 mg/dL — ABNORMAL HIGH (ref 70–99)

## 2020-06-27 LAB — CBC WITH DIFFERENTIAL/PLATELET
Abs Immature Granulocytes: 0.09 10*3/uL — ABNORMAL HIGH (ref 0.00–0.07)
Basophils Absolute: 0 10*3/uL (ref 0.0–0.1)
Basophils Relative: 0 %
Eosinophils Absolute: 0 10*3/uL (ref 0.0–0.5)
Eosinophils Relative: 0 %
HCT: 39.4 % (ref 39.0–52.0)
Hemoglobin: 13.1 g/dL (ref 13.0–17.0)
Immature Granulocytes: 1 %
Lymphocytes Relative: 8 %
Lymphs Abs: 0.8 10*3/uL (ref 0.7–4.0)
MCH: 27 pg (ref 26.0–34.0)
MCHC: 33.2 g/dL (ref 30.0–36.0)
MCV: 81.2 fL (ref 80.0–100.0)
Monocytes Absolute: 0.8 10*3/uL (ref 0.1–1.0)
Monocytes Relative: 8 %
Neutro Abs: 8.1 10*3/uL — ABNORMAL HIGH (ref 1.7–7.7)
Neutrophils Relative %: 83 %
Platelets: 128 10*3/uL — ABNORMAL LOW (ref 150–400)
RBC: 4.85 MIL/uL (ref 4.22–5.81)
RDW: 21.2 % — ABNORMAL HIGH (ref 11.5–15.5)
Smear Review: NORMAL
WBC: 9.8 10*3/uL (ref 4.0–10.5)
nRBC: 0 % (ref 0.0–0.2)

## 2020-06-27 LAB — BASIC METABOLIC PANEL
Anion gap: 12 (ref 5–15)
BUN: 35 mg/dL — ABNORMAL HIGH (ref 6–20)
CO2: 38 mmol/L — ABNORMAL HIGH (ref 22–32)
Calcium: 9.5 mg/dL (ref 8.9–10.3)
Chloride: 86 mmol/L — ABNORMAL LOW (ref 98–111)
Creatinine, Ser: 0.99 mg/dL (ref 0.61–1.24)
GFR, Estimated: >60 mL/min (ref >60)
Glucose, Bld: 104 mg/dL — ABNORMAL HIGH (ref 70–99)
Potassium: 4.3 mmol/L (ref 3.5–5.1)
Sodium: 136 mmol/L (ref 135–145)

## 2020-06-27 LAB — MAGNESIUM: Magnesium: 2.2 mg/dL (ref 1.7–2.4)

## 2020-06-27 LAB — PHOSPHORUS: Phosphorus: 3.7 mg/dL (ref 2.5–4.6)

## 2020-06-27 LAB — DIGOXIN LEVEL: Digoxin Level: 0.6 ng/mL — ABNORMAL LOW (ref 0.8–2.0)

## 2020-06-27 MED ORDER — SPIRONOLACTONE 25 MG PO TABS
25.0000 mg | ORAL_TABLET | Freq: Every day | ORAL | Status: DC
  Administered 2020-06-28: 25 mg via ORAL
  Filled 2020-06-27: qty 1

## 2020-06-27 NOTE — Progress Notes (Signed)
Progress Note  Patient Name: [name] [name] Date of Encounter: 06/27/2020  [facility] Cardiologist: Ida Rogue, MD   Subjective   UOP -3.6L, net -27.7L. He is feeling much better however still on supplemental O2. Tele shows 8 beats NSVT.  Inpatient Medications    Scheduled Meds: . apixaban  5 mg Oral BID  . atorvastatin  80 mg Oral Daily  . Chlorhexidine Gluconate Cloth  6 each Topical Daily  . digoxin  0.125 mg Oral Daily  . DULoxetine  30 mg Oral Daily  . ezetimibe  10 mg Oral Daily  . furosemide  60 mg Intravenous BID  . gabapentin  400 mg Oral QID  . insulin aspart  0-20 Units Subcutaneous Q4H  . midodrine  2.5 mg Oral TID WC  . mometasone-formoterol  2 puff Inhalation BID  . multivitamin with minerals  1 tablet Oral Daily  . pantoprazole  40 mg Oral Daily  . sodium chloride flush  3 mL Intravenous Q12H  . spironolactone  12.5 mg Oral Daily  . tamsulosin  0.4 mg Oral QPC supper  . traZODone  150 mg Oral QHS  . umeclidinium bromide  1 puff Inhalation Daily   Continuous Infusions: . sodium chloride Stopped (06/24/20 1400)   PRN Meds: sodium chloride, acetaminophen, Ipratropium-Albuterol, ipratropium-albuterol, nitroGLYCERIN, ondansetron (ZOFRAN) IV, oxyCODONE, sodium chloride flush   Vital Signs    Vitals:   06/27/20 0300 06/27/20 0500 06/27/20 0730 06/27/20 0800  BP: 100/82 99/85 (!) 87/71 120/84  Pulse: 70 69 79 70  Resp: '12 13 15 11  '$ Temp:  98.8 F (37.1 C)    TempSrc:      SpO2: 97% 98% 97% 98%  Weight:      Height:        Intake/Output Summary (Last 24 hours) at 06/27/2020 0957 Last data filed at 06/27/2020 0500 Gross per 24 hour  Intake 34.73 ml  Output 2600 ml  Net -2565.27 ml   Last 3 Weights 06/23/2020 06/21/2020 06/20/2020  Weight (lbs) 206 lb 5.6 oz 226 lb 10.1 oz 226 lb 6.6 oz  Weight (kg) 93.6 kg 102.8 kg 102.7 kg  Some encounter information is confidential and restricted. Go to Review Flowsheets activity to see all data.       Telemetry    Afib, frequent PVCs, NSVT, 8 beats - Personally Reviewed  ECG    No new - Personally Reviewed  Physical Exam   GEN: No acute distress.   Neck: No JVD Cardiac: Irreg Irreg, no murmurs, rubs, or gallops.  Respiratory: mild crackles at bases and wheezing. GI: Soft, nontender, non-distended  MS: 1+ edema; R BKA. Neuro:  Nonfocal  Psych: Normal affect   Labs    High Sensitivity Troponin:   Recent Labs  Lab 06/17/20 0049 06/17/20 0420  TROPONINIHS 39* 54*      Chemistry Recent Labs  Lab 06/25/20 0347 06/26/20 0514 06/27/20 0513  NA 136 134* 136  K 3.7 3.9 4.3  CL 87* 88* 86*  CO2 40* 39* 38*  GLUCOSE 151* 155* 104*  BUN 33* 34* 35*  CREATININE 1.14 1.13 0.99  CALCIUM 8.8* 9.0 9.5  GFRNONAA >60 >60 >60  ANIONGAP '9 7 12     '$ Hematology Recent Labs  Lab 06/25/20 0347 06/26/20 0514 06/27/20 0513  WBC 6.4 8.5 9.8  RBC 4.52 4.58 4.85  HGB 11.8* 12.4* 13.1  HCT 36.6* 37.1* 39.4  MCV 81.0 81.0 81.2  MCH 26.1 27.1 27.0  MCHC 32.2 33.4 33.2  RDW 21.3* 21.0* 21.2*  PLT 111* 115* 128*    BNPNo results for input(s): BNP, PROBNP in the last 168 hours.   DDimer No results for input(s): DDIMER in the last 168 hours.   Radiology    DG Chest Port 1 View  Result Date: 06/25/2020 CLINICAL DATA:  Shortness of breath. EXAM: PORTABLE CHEST 1 VIEW COMPARISON:  June 18, 2020. FINDINGS: Stable cardiomegaly with central pulmonary vascular congestion. No pneumothorax is noted. Left internal jugular catheter is noted with distal tip in expected position of left brachiocephalic vein. Mild bibasilar atelectasis or edema is noted with minimal pleural effusions. Bony thorax is unremarkable. IMPRESSION: Stable cardiomegaly with central pulmonary vascular congestion. Mild bibasilar atelectasis or edema is noted with minimal pleural effusions. Electronically Signed   By: Marijo Conception M.D.   On: 06/25/2020 13:07    Cardiac Studies   Echo 06/2020 1. Left  ventricular ejection fraction, by estimation, is <20%. Left  ventricular ejection fraction by PLAX is 14 %. The left ventricle has  severely decreased function. The left ventricle demonstrates global  hypokinesis. The left ventricular internal  cavity size was severely dilated. Left ventricular diastolic parameters  are indeterminate.  2. Right ventricular systolic function is severely reduced. The right  ventricular size is moderately enlarged. There is severely elevated  pulmonary artery systolic pressure. The estimated right ventricular  systolic pressure is [id number] mmHg.  3. Left atrial size was severely dilated.  4. Right atrial size was severely dilated.  5. Tricuspid valve regurgitation is mild to moderate.  6. The inferior vena cava is dilated in size with <50% respiratory  variability, suggesting right atrial pressure of 15 mmHg.   Patient Profile     60 y.o. male with history of CAD/PCI, HFrEF EF less than 20%, persistent A. fib, PAD, right BKA, OSA, COPD presenting with shortness of breath. Being seen for HFrEF and volume overload.   Assessment & Plan    HFrEF, EF<20% - previously on lasix drip, now on IV lasix '60mg'$  BID - milrinone stopped 1/17 - continue spironolactone - consider low dose coreg - trying to wean midodrine, now 2.'5mg'$ BID - UOP -3.5L, net -27.4L - weight 229>206lbs - kidney function stable  Persistent Afib - Heart rates reasonably controlled - BB held for acute CHF - Digoxin started this admission - continue Eliquis for a/c  CAD with prior PCI/demand ischemia - patient previously denied [facility]G at [facility] 01/2020 showed no great targets for PCI with consideration for atherectomy of LCx but patient left AMA - continue lipitor, zetia  HTN - weaning off midodrine, pressures soft overnight but improving  HL - continue statin and zetia - LDL 77  NSVT - known depressed EF - restart BB when able - continue tele  For questions or updates,  please contact [facility] Please consult [email or web address] for contact info under        Signed, Geneveive Furness Ninfa Meeker, PA-C  06/27/2020, 9:57 AM

## 2020-06-27 NOTE — Progress Notes (Signed)
PROGRESS NOTE    [name] [name]  [id number] DOB: February 15, 1961 DOA: 06/17/2020 PCP: [name], MD   Brief Narrative: 60 y.o.malewith a hx of ICM EF 20-25%, CAD/PCI, tobacco abuse, COPD, PAD s/p L SFA stenting, s/p R BKA, afib on Eliquis, OSA noncompliant with CPAP, hx of leaving AMA and noncompliancewho is being seen today for the evaluation ofheart failure.presents to the emergency room with shortness of breath, fluid retention in his legs and intermittent chest pain.  Admitted to the ICU for milrinone drip and lasix drip.  Stabilized from an ICU standpoint.  Milrinone gtt. has been discontinued.  Lasix GTT has been discontinued and transition to twice daily.  Patient 27 L net negative since admission.  Cardiology following closely.  No longer any ICU needs.  Care assumed by [facility] hospitalist service.   Assessment & Plan:   Principal Problem:   Acute on chronic systolic CHF (congestive heart failure) (HCC) Active Problems:   Coronary artery disease of native artery of native heart with stable angina pectoris (HCC)   Hypertension   Type 2 diabetes mellitus with other specified complication (HCC)   AKI (acute kidney injury) ([place])   COPD (chronic obstructive pulmonary disease) ([place])   Long term current use of opiate analgesic   Chronic hyponatremia   Persistent atrial fibrillation (HCC)   CHF exacerbation (HCC)   Chronic anticoagulation   CHF (congestive heart failure) (HCC)   Acute on chronic heart failure ([place])  60 yo white male with multiple medical issues with severe and acute systolic CHF exacerbation with shock with progressive cardiorenal syndrome  Cardiogenic shock In setting of acute systolic heart failure, EF <20% Was on milrinone, now weaned off Lasix drip transition to 60 mg IV twice daily Midodrine being weaned Plan: Continue Lasix 60 IV twice daily Continue spironolactone Midodrine dose decreased, now 2.5 twice daily Currently blood pressure cannot  tolerate addition of beta-blocker Reassess daily for addition of low-dose carvedilol Strict ins and outs Daily weights Daily kidney function  Acute Kidney Injury In setting of heart failure - Improved, now at baseline  Atrial Fibrillation -Continue digoxin - continue eliquis -Telemetry monitoring -Currently rate controlled  CAD Continue lipitor, zetia  Hx of COPD Continue incruse and dulera inhaler therapy Intravenous steroids stopped per ICU  Hyperlipidemia Continue Zetia  Depression Continue Cymbalta  BPH Continue Flomax  Insomnia Continue nightly trazodone   DVT prophylaxis: Eliquis Code Status: DNR Family Communication: None today.  Offered to call family but patient declined Disposition Plan: Status is: Inpatient  Remains inpatient appropriate because:Inpatient level of care appropriate due to severity of illness   Dispo: The patient is from: Home              Anticipated d/c is to: SNF              Anticipated d/c date is: 2 days              Patient currently is not medically stable to d/c.  Resolving cardiogenic shock, severe cardiomyopathy ejection fraction less than 20%.  Weaned off cardiac inotrope infusion.  Remains on intravenous Lasix.  Disposition plan pending.       Consultants:   Cardiology-[facility]  Procedures:   None  Antimicrobials:   None   Subjective: Patient seen and examined.  Resting comfortably in bed.  No complaints.  No visible shortness of breath.  Objective: Vitals:   06/27/20 0800 06/27/20 0900 06/27/20 1000 06/27/20 1200  BP: 120/84  122/87 100/65  Pulse: 70  94 100  Resp: '11 15 14 16  '$ Temp: 98 F (36.7 C)   98.2 F (36.8 C)  TempSrc: Oral   Oral  SpO2: 98% (!) 86% 93% 100%  Weight:      Height:        Intake/Output Summary (Last 24 hours) at 06/27/2020 1441 Last data filed at 06/27/2020 1321 Gross per 24 hour  Intake 34.73 ml  Output 2600 ml  Net -2565.27 ml   Filed Weights   06/20/20 0500  06/21/20 0500 06/23/20 0419  Weight: 102.7 kg 102.8 kg 93.6 kg    Examination:  General exam: Appears calm and comfortable  Respiratory system: Bibasilar crackles.  Normal work of breathing.  Room air Cardiovascular system: S1-S2 heard, irregular rate, irregular rhythm, no murmurs, 1+ pitting edema LLE Gastrointestinal system: Abdomen is nondistended, soft and nontender. No organomegaly or masses felt. Normal bowel sounds heard. Central nervous system: Alert and oriented. No focal neurological deficits. Extremities: Right BKA, 1+ pitting edema Skin: No rashes, lesions or ulcers Psychiatry: Judgement and insight appear normal. Mood & affect appropriate.     Data Reviewed: I have personally reviewed following labs and imaging studies  CBC: Recent Labs  Lab 06/23/20 0339 06/24/20 0508 06/25/20 0347 06/26/20 0514 06/27/20 0513  WBC 6.3 6.1 6.4 8.5 9.8  NEUTROABS 5.2 4.8 5.3 7.2 8.1*  HGB 12.2* 11.6* 11.8* 12.4* 13.1  HCT 36.4* 33.9* 36.6* 37.1* 39.4  MCV 80.9 80.9 81.0 81.0 81.2  PLT 116* 108* 111* 115* 0000000*   Basic Metabolic Panel: Recent Labs  Lab 06/23/20 0339 06/24/20 0508 06/25/20 0347 06/26/20 0514 06/27/20 0513  NA 136 136 136 134* 136  K 3.8 3.8 3.7 3.9 4.3  CL 89* 88* 87* 88* 86*  CO2 39* 39* 40* 39* 38*  GLUCOSE 148* 91 151* 155* 104*  BUN 34* 35* 33* 34* 35*  CREATININE 1.11 1.04 1.14 1.13 0.99  CALCIUM 8.3* 8.8* 8.8* 9.0 9.5  MG 2.3 2.0 2.0 2.0 2.2  PHOS 2.6 2.6 2.7 3.1 3.7   GFR: Estimated Creatinine Clearance: 89.8 mL/min (by C-G formula based on SCr of 0.99 mg/dL). Liver Function Tests: No results for input(s): AST, ALT, ALKPHOS, BILITOT, PROT, ALBUMIN in the last 168 hours. No results for input(s): LIPASE, AMYLASE in the last 168 hours. No results for input(s): AMMONIA in the last 168 hours. Coagulation Profile: Recent Labs  Lab 06/22/20 1241  INR 1.3*   Cardiac Enzymes: No results for input(s): CKTOTAL, CKMB, CKMBINDEX, TROPONINI in the  last 168 hours. BNP (last 3 results) No results for input(s): PROBNP in the last 8760 hours. HbA1C: No results for input(s): HGBA1C in the last 72 hours. CBG: Recent Labs  Lab 06/26/20 1934 06/26/20 2318 06/27/20 0310 06/27/20 0730 06/27/20 1112  GLUCAP 264* 126* 102* 108* 182*   Lipid Profile: No results for input(s): CHOL, HDL, LDLCALC, TRIG, CHOLHDL, LDLDIRECT in the last 72 hours. Thyroid Function Tests: No results for input(s): TSH, T4TOTAL, FREET4, T3FREE, THYROIDAB in the last 72 hours. Anemia Panel: No results for input(s): VITAMINB12, FOLATE, FERRITIN, TIBC, IRON, RETICCTPCT in the last 72 hours. Sepsis Labs: No results for input(s): PROCALCITON, LATICACIDVEN in the last 168 hours.  No results found for this or any previous visit (from the past 240 hour(s)).       Radiology Studies: No results found.      Scheduled Meds: . apixaban  5 mg Oral BID  . atorvastatin  80 mg Oral Daily  . Chlorhexidine  Gluconate Cloth  6 each Topical Daily  . digoxin  0.125 mg Oral Daily  . DULoxetine  30 mg Oral Daily  . ezetimibe  10 mg Oral Daily  . furosemide  60 mg Intravenous BID  . gabapentin  400 mg Oral QID  . insulin aspart  0-20 Units Subcutaneous Q4H  . midodrine  2.5 mg Oral TID WC  . mometasone-formoterol  2 puff Inhalation BID  . multivitamin with minerals  1 tablet Oral Daily  . pantoprazole  40 mg Oral Daily  . sodium chloride flush  3 mL Intravenous Q12H  . spironolactone  12.5 mg Oral Daily  . tamsulosin  0.4 mg Oral QPC supper  . traZODone  150 mg Oral QHS  . umeclidinium bromide  1 puff Inhalation Daily   Continuous Infusions: . sodium chloride Stopped (06/24/20 1400)     LOS: 10 days    Time spent: 25 minutes    Sidney Ace, MD [facility] Pager [phone]  If 7PM-7AM, please contact night-coverage 06/27/2020, 2:41 PM

## 2020-06-27 NOTE — Progress Notes (Addendum)
Physical Therapy Treatment Patient Details Name: [name] [name] MRN: [id number] DOB: Dec 27, 1960 Today's Date: 06/27/2020    History of Present Illness Pt is a 60 y/o M with PMH: Arthritis, Asthma, Atherosclerosis, BPH, Charcot's joint of foot-right, Chronic combined systolic and diastolic heart failure, COPD, CAD/PCI, tobacco abuse, PAD s/p L SFA stenting, s/p R BKA, afib on Eliquis, OSA noncompliant with CPAP, and h/o leaving AMA/medical noncompliance. Pt presented d/t SOB, DOE and fluid retention in LEs. Trop slightly elevated, Cardiology note indicates: "patient declined CABG in the past at [facility]; He had a cath in August showing no great PCI targets but considered atherectomy of Lcx however patient eventually left AMA, No plan for ischemic work-up at this time". Admitted acutely for management of acute/chronic CHF, requiring for milrinone drip.    PT Comments    Pt agrees to session with encouragement.  Participated in exercises as described below.  With encouragement and education agreed to sitting trial.  To EOB with min a x 1 and remained sitting for 4 minutes before asking to return to supine due to fatigue.  Declined standing or OOB today.  Pt with varied O2 readings during activity which seem to be more reflective of poor finger connectivity than actual readings given recovery times.  Reports limited tolerance due to fatigue.   Follow Up Recommendations  SNF     Equipment Recommendations   (if returns home, may benefit from hospital bed)    Recommendations for Other Services       Precautions / Restrictions Precautions Precautions: Fall Restrictions Weight Bearing Restrictions: No Other Position/Activity Restrictions: monitor HR    Mobility  Bed Mobility Overal bed mobility: Needs Assistance Bed Mobility: Supine to Sit;Sit to Supine     Supine to sit: Min assist Sit to supine: Min guard      Transfers                 General transfer comment: declined to  attempt due to fatigue  Ambulation/Gait                 Stairs             Wheelchair Mobility    Modified Rankin (Stroke Patients Only)       Balance Overall balance assessment: Needs assistance Sitting-balance support: No upper extremity supported;Feet supported Sitting balance-Leahy Scale: Good                                      Cognition Arousal/Alertness: Awake/alert Behavior During Therapy: WFL for tasks assessed/performed Overall Cognitive Status: Within Functional Limits for tasks assessed                                        Exercises General Exercises - Lower Extremity Ankle Circles/Pumps: AROM;Strengthening;Left;10 reps;Supine Quad Sets: AROM;Strengthening;Both;10 reps;Supine Short Arc Quad: AROM;Strengthening;Both;10 reps;Supine Heel Slides: AROM;Strengthening;Both;10 reps;Supine Hip ABduction/ADduction: AROM;Strengthening;Both;10 reps;Supine Straight Leg Raises: AROM;Strengthening;Both;10 reps;Supine    General Comments        Pertinent Vitals/Pain Pain Assessment: No/denies pain    Home Living                      Prior Function            PT Goals (current goals can now be found in the care  plan section) Progress towards PT goals: Progressing toward goals    Frequency    Min 2X/week      PT Plan Current plan remains appropriate    Co-evaluation              AM-PAC PT "6 Clicks" Mobility   Outcome Measure  Help needed turning from your back to your side while in a flat bed without using bedrails?: A Little Help needed moving from lying on your back to sitting on the side of a flat bed without using bedrails?: A Little Help needed moving to and from a bed to a chair (including a wheelchair)?: A Lot Help needed standing up from a chair using your arms (e.g., wheelchair or bedside chair)?: A Lot Help needed to walk in hospital room?: Total Help needed climbing 3-5 steps  with a railing? : Total 6 Click Score: 12    End of Session Equipment Utilized During Treatment: Gait belt Activity Tolerance: Patient limited by fatigue Patient left: in bed;with call bell/phone within reach;with bed alarm set Nurse Communication: Mobility status;Precautions;Other (comment) (pt's HR during session and fatigue) PT Visit Diagnosis: Muscle weakness (generalized) (M62.81);Difficulty in walking, not elsewhere classified (R26.2)     Time: [id number] PT Time Calculation (min) (ACUTE ONLY): 24 min  Charges:  $Therapeutic Exercise: 8-22 mins $Therapeutic Activity: 8-22 mins                    Chesley Noon, PTA 06/27/20, 9:59 AM

## 2020-06-27 NOTE — Progress Notes (Signed)
Occupational Therapy Treatment Patient Details Name: [name] [name] MRN: [id number] DOB: Nov 19, 1960 Today's Date: 06/27/2020    History of present illness Pt is a 60 y/o M with PMH: Arthritis, Asthma, Atherosclerosis, BPH, Charcot's joint of foot-right, Chronic combined systolic and diastolic heart failure, COPD, CAD/PCI, tobacco abuse, PAD s/p L SFA stenting, s/p R BKA, afib on Eliquis, OSA noncompliant with CPAP, and h/o leaving AMA/medical noncompliance. Pt presented d/t SOB, DOE and fluid retention in LEs. Trop slightly elevated, Cardiology note indicates: "patient declined CABG in the past at [facility]; He had a cath in August showing no great PCI targets but considered atherectomy of Lcx however patient eventually left AMA, No plan for ischemic work-up at this time". Admitted acutely for management of acute/chronic CHF, requiring for milrinone drip.   OT comments  [name] [name] was seen for OT treatment on this date. Upon arrival to room pt awake/alert, semi-supine in bed, and agreeable to OT tx session. OT facilitates ADL tasks as described below. Pt requires set-up/supervision-MIN A for safety for LB dressing and functional mobility. See ADL section below for additional detail regarding occupational performance. Pt educated on safe use of AE, safe transfer technique, and importance of functional activity during hospitalization. He return verbalizes understanding.  Pt making good progressing toward goals and continues to benefit from skilled OT services to maximize return to PLOF and minimize risk of future falls, injury, caregiver burden, and readmission. Will continue to follow POC. Discharge recommendation remains appropriate.    Follow Up Recommendations  SNF    Equipment Recommendations   (Rolling walker)    Recommendations for Other Services      Precautions / Restrictions Precautions Precautions: Fall Restrictions Weight Bearing Restrictions: No Other Position/Activity Restrictions:  monitor HR; hx of R BKA, wears prosthesis.       Mobility Bed Mobility Overal bed mobility: Needs Assistance Bed Mobility: Supine to Sit;Sit to Supine     Supine to sit: Supervision Sit to supine: Supervision      Transfers Overall transfer level: Needs assistance Equipment used: Rolling walker (2 wheeled) Transfers: Sit to/from Stand Sit to Stand: Min assist         General transfer comment: Moderate multimodal cueing for safety/sequencing.    Balance Overall balance assessment: Needs assistance Sitting-balance support: No upper extremity supported;Feet supported Sitting balance-[name] [name] Scale: Good Sitting balance - Comments: Steady static/dynamic sitting at EOB.   Standing balance support: During functional activity;Bilateral upper extremity supported Standing balance-[name] [name] Scale: Poor Standing balance comment: Requires BUE support on RW during static standing at EOB.                           ADL either performed or assessed with clinical judgement   ADL Overall ADL's : Needs assistance/impaired                     Lower Body Dressing: Sitting/lateral leans;Sit to/from stand;Minimal assistance;Set up;Supervision/safety Lower Body Dressing Details (indicate cue type and reason): Pt performs LB dressing tasks including prosthesis management with Set up/supervision this date. He is able to don R prosthetic and L shoe while seated EOB with set-up assist. MIN A to doff L shoe 2/2 fatigue at end of session. Toilet Transfer: BSC;Minimal assistance;Stand-pivot;Cueing for sequencing;Cueing for safety   Toileting- Clothing Manipulation and Hygiene: Set up;Supervision/safety;Sitting/lateral lean               Vision Patient Visual Report: No change from baseline  Perception     Praxis      Cognition Arousal/Alertness: Awake/alert Behavior During Therapy: WFL for tasks assessed/performed Overall Cognitive Status: Within Functional Limits for  tasks assessed                                          Exercises Other Exercises Other Exercises: OT facilitates bed mobility sup<>sit, functional mobility STS, and LB dressing tasks as described above. See ADL section for additional detail regarding occupational performance.   Shoulder Instructions       General Comments      Pertinent Vitals/ Pain       Pain Assessment: No/denies pain  Home Living                                          Prior Functioning/Environment              Frequency  Min 1X/week        Progress Toward Goals  OT Goals(current goals can now be found in the care plan section)  Progress towards OT goals: Progressing toward goals  Acute Rehab OT Goals Patient Stated Goal: to go home OT Goal Formulation: With patient Time For Goal Achievement: 07/03/20 Potential to Achieve Goals: [place] Discharge plan remains appropriate;Frequency remains appropriate    Co-evaluation                 AM-PAC OT "6 Clicks" Daily Activity     Outcome Measure   Help from another person eating meals?: None Help from another person taking care of personal grooming?: A Little Help from another person toileting, which includes using toliet, bedpan, or urinal?: A Lot Help from another person bathing (including washing, rinsing, drying)?: A Lot Help from another person to put on and taking off regular upper body clothing?: A Little Help from another person to put on and taking off regular lower body clothing?: A Lot 6 Click Score: 16    End of Session Equipment Utilized During Treatment: Oxygen  OT Visit Diagnosis: Unsteadiness on feet (R26.81);Muscle weakness (generalized) (M62.81)   Activity Tolerance Treatment limited secondary to medical complications (Comment)   Patient Left in bed;with call bell/phone within reach   Nurse Communication Mobility status        Time: [id number] OT Time Calculation (min):  28 min  Charges: OT General Charges $OT Visit: 1 Visit OT Treatments $Self Care/Home Management : 23-37 mins  Shara Blazing, M.S., OTR/L Ascom: [phone] 06/27/20, 4:41 PM

## 2020-06-28 ENCOUNTER — Ambulatory Visit: Payer: Medicare Other

## 2020-06-28 LAB — CBC WITH DIFFERENTIAL/PLATELET
Abs Immature Granulocytes: 0.07 10*3/uL (ref 0.00–0.07)
Basophils Absolute: 0 10*3/uL (ref 0.0–0.1)
Basophils Relative: 0 %
Eosinophils Absolute: 0.1 10*3/uL (ref 0.0–0.5)
Eosinophils Relative: 1 %
HCT: 39.6 % (ref 39.0–52.0)
Hemoglobin: 12.8 g/dL — ABNORMAL LOW (ref 13.0–17.0)
Immature Granulocytes: 1 %
Lymphocytes Relative: 11 %
Lymphs Abs: 1.1 10*3/uL (ref 0.7–4.0)
MCH: 26.3 pg (ref 26.0–34.0)
MCHC: 32.3 g/dL (ref 30.0–36.0)
MCV: 81.5 fL (ref 80.0–100.0)
Monocytes Absolute: 0.9 10*3/uL (ref 0.1–1.0)
Monocytes Relative: 8 %
Neutro Abs: 8 10*3/uL — ABNORMAL HIGH (ref 1.7–7.7)
Neutrophils Relative %: 79 %
Platelets: 137 10*3/uL — ABNORMAL LOW (ref 150–400)
RBC: 4.86 MIL/uL (ref 4.22–5.81)
RDW: 21.2 % — ABNORMAL HIGH (ref 11.5–15.5)
Smear Review: NORMAL
WBC: 10.1 10*3/uL (ref 4.0–10.5)
nRBC: 0 % (ref 0.0–0.2)

## 2020-06-28 LAB — GLUCOSE, CAPILLARY
Glucose-Capillary: 102 mg/dL — ABNORMAL HIGH (ref 70–99)
Glucose-Capillary: 88 mg/dL (ref 70–99)
Glucose-Capillary: 151 mg/dL — ABNORMAL HIGH (ref 70–99)
Glucose-Capillary: 144 mg/dL — ABNORMAL HIGH (ref 70–99)
Glucose-Capillary: 150 mg/dL — ABNORMAL HIGH (ref 70–99)

## 2020-06-28 LAB — BASIC METABOLIC PANEL
Anion gap: 10 (ref 5–15)
BUN: 34 mg/dL — ABNORMAL HIGH (ref 6–20)
CO2: 37 mmol/L — ABNORMAL HIGH (ref 22–32)
Calcium: 9.3 mg/dL (ref 8.9–10.3)
Chloride: 89 mmol/L — ABNORMAL LOW (ref 98–111)
Creatinine, Ser: 1.06 mg/dL (ref 0.61–1.24)
GFR, Estimated: >60 mL/min (ref >60)
Glucose, Bld: 149 mg/dL — ABNORMAL HIGH (ref 70–99)
Potassium: 4 mmol/L (ref 3.5–5.1)
Sodium: 136 mmol/L (ref 135–145)

## 2020-06-28 LAB — PHOSPHORUS: Phosphorus: 4.3 mg/dL (ref 2.5–4.6)

## 2020-06-28 LAB — MAGNESIUM: Magnesium: 2 mg/dL (ref 1.7–2.4)

## 2020-06-28 MED ORDER — SPIRONOLACTONE 25 MG PO TABS
12.5000 mg | ORAL_TABLET | Freq: Every day | ORAL | Status: DC
  Administered 2020-06-29 – 2020-07-01 (×3): 12.5 mg via ORAL
  Filled 2020-06-28 (×3): qty 0.5
  Filled 2020-06-28 (×2): qty 1

## 2020-06-28 NOTE — Plan of Care (Signed)
Pt overall doing well today. Was able to ambulate in room this afternoon with more tolerance than yesterday. VS within parameters. PRN oxycodone given for left leg pain.   Problem: Education: Goal: Knowledge of General Education information will improve Description: Including pain rating scale, medication(s)/side effects and non-pharmacologic comfort measures Outcome: Progressing   Problem: Health Behavior/Discharge Planning: Goal: Ability to manage health-related needs will improve Outcome: Progressing   Problem: Clinical Measurements: Goal: Ability to maintain clinical measurements within normal limits will improve Outcome: Progressing Goal: Will remain free from infection Outcome: Progressing Goal: Diagnostic test results will improve Outcome: Progressing Goal: Respiratory complications will improve Outcome: Progressing Goal: Cardiovascular complication will be avoided Outcome: Progressing   Problem: Activity: Goal: Risk for activity intolerance will decrease Outcome: Progressing   Problem: Nutrition: Goal: Adequate nutrition will be maintained Outcome: Progressing   Problem: Coping: Goal: Level of anxiety will decrease Outcome: Progressing   Problem: Elimination: Goal: Will not experience complications related to bowel motility Outcome: Progressing Goal: Will not experience complications related to urinary retention Outcome: Progressing   Problem: Pain Managment: Goal: General experience of comfort will improve Outcome: Progressing   Problem: Safety: Goal: Ability to remain free from injury will improve Outcome: Progressing   Problem: Skin Integrity: Goal: Risk for impaired skin integrity will decrease Outcome: Progressing

## 2020-06-28 NOTE — Progress Notes (Signed)
Progress Note  Patient Name: [name] [name] Date of Encounter: 06/28/2020  [facility] Cardiologist: Ida Rogue, MD   Subjective   UOP -3.4L, net -30.8L . He reports he is feeling better. Still has some extra fluid on exam. Pressures improving.   Inpatient Medications    Scheduled Meds: . apixaban  5 mg Oral BID  . atorvastatin  80 mg Oral Daily  . Chlorhexidine Gluconate Cloth  6 each Topical Daily  . digoxin  0.125 mg Oral Daily  . DULoxetine  30 mg Oral Daily  . ezetimibe  10 mg Oral Daily  . furosemide  60 mg Intravenous BID  . gabapentin  400 mg Oral QID  . insulin aspart  0-20 Units Subcutaneous Q4H  . midodrine  2.5 mg Oral TID WC  . mometasone-formoterol  2 puff Inhalation BID  . multivitamin with minerals  1 tablet Oral Daily  . pantoprazole  40 mg Oral Daily  . sodium chloride flush  3 mL Intravenous Q12H  . spironolactone  25 mg Oral Daily  . tamsulosin  0.4 mg Oral QPC supper  . traZODone  150 mg Oral QHS  . umeclidinium bromide  1 puff Inhalation Daily   Continuous Infusions: . sodium chloride Stopped (06/24/20 1400)   PRN Meds: sodium chloride, acetaminophen, Ipratropium-Albuterol, ipratropium-albuterol, nitroGLYCERIN, ondansetron (ZOFRAN) IV, oxyCODONE, sodium chloride flush   Vital Signs    Vitals:   06/28/20 0500 06/28/20 0600 06/28/20 0700 06/28/20 0800  BP:  105/72 123/79 123/85  Pulse:  69 80 92  Resp:  '12 13 20  '$ Temp:    (!) 96.5 F (35.8 C)  TempSrc:    Axillary  SpO2:  97% 97% 99%  Weight: 93.6 kg     Height:        Intake/Output Summary (Last 24 hours) at 06/28/2020 0844 Last data filed at 06/28/2020 0600 Gross per 24 hour  Intake --  Output 3400 ml  Net -3400 ml   Last 3 Weights 06/28/2020 06/23/2020 06/21/2020  Weight (lbs) 206 lb 5.6 oz 206 lb 5.6 oz 226 lb 10.1 oz  Weight (kg) 93.6 kg 93.6 kg 102.8 kg  Some encounter information is confidential and restricted. Go to Review Flowsheets activity to see all data.       Telemetry    Afib HR 70-80s, PVCs, brief NSVT - Personally Reviewed  ECG    No new - Personally Reviewed  Physical Exam   GEN: No acute distress.   Neck: No JVD Cardiac: RRR, + murmur, no rubs, or gallops.  Respiratory: mild crackles at bases. GI: Soft, nontender, non-distended  MS: + edema; s/p right BKA Neuro:  Nonfocal  Psych: Normal affect   Labs    High Sensitivity Troponin:   Recent Labs  Lab 06/17/20 0049 06/17/20 0420  TROPONINIHS 39* 54*      Chemistry Recent Labs  Lab 06/25/20 0347 06/26/20 0514 06/27/20 0513  NA 136 134* 136  K 3.7 3.9 4.3  CL 87* 88* 86*  CO2 40* 39* 38*  GLUCOSE 151* 155* 104*  BUN 33* 34* 35*  CREATININE 1.14 1.13 0.99  CALCIUM 8.8* 9.0 9.5  GFRNONAA >60 >60 >60  ANIONGAP '9 7 12     '$ Hematology Recent Labs  Lab 06/26/20 0514 06/27/20 0513 06/28/20 0631  WBC 8.5 9.8 10.1  RBC 4.58 4.85 4.86  HGB 12.4* 13.1 12.8*  HCT 37.1* 39.4 39.6  MCV 81.0 81.2 81.5  MCH 27.1 27.0 26.3  MCHC 33.4 33.2 32.3  RDW 21.0* 21.2* 21.2*  PLT 115* 128* 137*    BNPNo results for input(s): BNP, PROBNP in the last 168 hours.   DDimer No results for input(s): DDIMER in the last 168 hours.   Radiology    No results found.  Cardiac Studies   Echo 06/2020 1. Left ventricular ejection fraction, by estimation, is <20%. Left  ventricular ejection fraction by PLAX is 14 %. The left ventricle has  severely decreased function. The left ventricle demonstrates global  hypokinesis. The left ventricular internal  cavity size was severely dilated. Left ventricular diastolic parameters  are indeterminate.  2. Right ventricular systolic function is severely reduced. The right  ventricular size is moderately enlarged. There is severely elevated  pulmonary artery systolic pressure. The estimated right ventricular  systolic pressure is [id number] mmHg.  3. Left atrial size was severely dilated.  4. Right atrial size was severely dilated.  5.  Tricuspid valve regurgitation is mild to moderate.  6. The inferior vena cava is dilated in size with <50% respiratory  variability, suggesting right atrial pressure of 15 mmHg.    Patient Profile     60 y.o. male with history of CAD/PCI, HFrEF EF less than 20%, persistent A. fib, PAD, right BKA, OSA, COPD presenting with shortness of breath. Being seen for HFrEF and volume overload.   Assessment & Plan    HFrEF, EF<20% - previously on lasix drip, now on IV lasix '60mg'$  BID - milrinone stopped 1/17 - continue spironolactone. This was increased to '25mg'$  daily - trying to wean midodrine, now 2.'5mg'$ BID. Pressures improving - soft pressures limiting GMDT. Once stable consider addition of low dose BB - UOP -3.4L, net -30.8L  - weight 229>206lbs - BMET pending  Persistent Afib - Heart rates controlled - BB held for acute CHF - Digoxin started this admission - continue Eliquis for a/c  CAD with prior PCI/demand ischemia - patient previously denied CABG at [facility] 01/2020 showed no great targets for PCI with consideration for atherectomy of LCx but patient left AMA - continue lipitor, zetia  HTN - weaning off midodrine, pressures improving  HL - continue statin and zetia - LDL 77  NSVT - known depressed EF - restart BB when able - continue tele   For questions or updates, please contact [facility] Please consult [email or web address] for contact info under        Signed, Shateka Petrea Ninfa Meeker, PA-C  06/28/2020, 8:44 AM

## 2020-06-28 NOTE — TOC Initial Note (Signed)
Transition of Care [facility]) - Progression Note    Patient Details  Name: [name] [name] MRN: [id number] Date of Birth: [birth year]  Transition of Care [facility]) CM/SW [place], [place] Phone Number: [phone] 06/28/2020, 9:33 AM  Clinical Narrative:      CSW met with patient about Medicaid application.  CSW explained to the patient the role of TOC in patient care.  CSW explained to the patient that we would not be able to assist him with his Medicaid application but that I would bring him a list of resources in the area with contact information for DSS.  Patient stated he is able to perform all ADLs without assistance, but needs assistance for driving.  Patient stated he does not have difficulty with getting and taking his medications and his PCP is Dr. Jacelyn Grip.  Patient stated Richardson,[name] (Significant other) [phone] (Mobile) helps him at home.  Expected Discharge Plan: Skilled Nursing Facility Barriers to Discharge: SNF Pending bed offer  Expected Discharge Plan and Services Expected Discharge Plan: [place] In-house Referral: Clinical Social Work   Post Acute Care Choice: [place] Living arrangements for the past 2 months: Single Family Home                                       Social Determinants of Health (SDOH) Interventions    Readmission Risk Interventions Readmission Risk Prevention Plan 11/09/2018 11/08/2018  Transportation Screening - Complete  HRI or [place] - Complete  Social Work Consult for [place] Planning/Counseling Not Complete -  SW consult not completed comments na -  Palliative Care Screening Not Applicable -  Medication Review Press photographer) - Complete  Some recent data might be hidden

## 2020-06-28 NOTE — Progress Notes (Signed)
PROGRESS NOTE    [name] [name]  [id number] DOB: [birth year] DOA: 06/17/2020 PCP: [name], MD   Chief complaint.  Shortness of breath. Brief Narrative:  60 y.o.malewith a hx of ICM EF 20-25%, CAD/PCI, tobacco abuse, COPD, PAD s/p L SFA stenting, s/p R BKA, afib on Eliquis, OSA noncompliant with CPAP, hx of leaving AMA and noncompliancewho is being seen today for the evaluation ofheart failure.presents to the emergency room with shortness of breath, fluid retention in his legs and intermittent chest pain.  Admitted to the ICU for milrinone drip and lasix drip.  Stabilized from an ICU standpoint.  Milrinone gtt. has been discontinued.  Lasix GTT has been discontinued and transition to twice daily.  Patient 27 L net negative since admission.  Cardiology following closely.  No longer any ICU needs.  Care assumed by [facility] hospitalist service.    Assessment & Plan:   Principal Problem:   Acute on chronic systolic CHF (congestive heart failure) (HCC) Active Problems:   Coronary artery disease of native artery of native heart with stable angina pectoris ([place])   Hypertension   Type 2 diabetes mellitus with other specified complication (HCC)   AKI (acute kidney injury) ([place])   COPD (chronic obstructive pulmonary disease) ([place])   Long term current use of opiate analgesic   Chronic hyponatremia   Persistent atrial fibrillation (HCC)   CHF exacerbation (HCC)   Chronic anticoagulation   CHF (congestive heart failure) ([place])   Acute on chronic heart failure ([place])  #1.  Cardiogenic shock. Acute on chronic systolic congestive heart failure, EF less than 20%. Patient condition much improved.  Volume status better.  Currently still receiving IV Lasix 60 mg IV twice a day. Followed by cardiology.  #2.  Acute kidney injury. Condition has improved  3.  Persistent atrial fibrillation. Continue Eliquis and rate control.  4.  COPD. No bronchospasm.  Patient is transferred to  progressive unit pending bed.  Restart physical therapy occupational therapy.  Pending SNF.    DVT prophylaxis: Eliquis Code Status: DNR Family Communication:  Disposition Plan:  .   Status is: Inpatient  Remains inpatient appropriate because:Inpatient level of care appropriate due to severity of illness   Dispo: The patient is from: Home              Anticipated d/c is to: SNF              Anticipated d/c date is: 3 days              Patient currently is not medically stable to d/c.        I/O last 3 completed shifts: In: 34.7 [I.V.:34.7] Out: 5150 [Urine:5150] No intake/output data recorded.     Consultants:   Cardiology  Procedures: None  Antimicrobials:None  Subjective: Feel much better.  No segment short of breath at rest, still short of breath with exertion.  He is diuresing well. No abdominal pain or nausea vomiting. No dysuria or hematuria. No fever or chills.  Objective: Vitals:   06/28/20 0500 06/28/20 0600 06/28/20 0700 06/28/20 0800  BP:  105/72 123/79 123/85  Pulse:  69 80 92  Resp:  '12 13 20  '$ Temp:    (!) 96.5 F (35.8 C)  TempSrc:    Axillary  SpO2:  97% 97% 99%  Weight: 93.6 kg     Height:        Intake/Output Summary (Last 24 hours) at 06/28/2020 0900 Last data filed at 06/28/2020 0600  Gross per 24 hour  Intake --  Output 3050 ml  Net -3050 ml   Filed Weights   06/21/20 0500 06/23/20 0419 06/28/20 0500  Weight: 102.8 kg 93.6 kg 93.6 kg    Examination:  General exam: Appears calm and comfortable  Respiratory system: Breath sounds without significant crackles. Respiratory effort normal. Cardiovascular system: S1 & S2 heard, RRR. No JVD, murmurs, rubs, gallops or clicks.  Gastrointestinal system: Abdomen is nondistended, soft and nontender. No organomegaly or masses felt. Normal bowel sounds heard. Central nervous system: Alert and oriented. No focal neurological deficits. Extremities: Right BKA, left lower extremity no  edema.. Skin: No rashes, lesions or ulcers Psychiatry: Judgement and insight appear normal. Mood & affect appropriate.     Data Reviewed: I have personally reviewed following labs and imaging studies  CBC: Recent Labs  Lab 06/24/20 0508 06/25/20 0347 06/26/20 0514 06/27/20 0513 06/28/20 0631  WBC 6.1 6.4 8.5 9.8 10.1  NEUTROABS 4.8 5.3 7.2 8.1* 8.0*  HGB 11.6* 11.8* 12.4* 13.1 12.8*  HCT 33.9* 36.6* 37.1* 39.4 39.6  MCV 80.9 81.0 81.0 81.2 81.5  PLT 108* 111* 115* 128* 0000000*   Basic Metabolic Panel: Recent Labs  Lab 06/23/20 0339 06/24/20 0508 06/25/20 0347 06/26/20 0514 06/27/20 0513 06/28/20 0631  NA 136 136 136 134* 136  --   K 3.8 3.8 3.7 3.9 4.3  --   CL 89* 88* 87* 88* 86*  --   CO2 39* 39* 40* 39* 38*  --   GLUCOSE 148* 91 151* 155* 104*  --   BUN 34* 35* 33* 34* 35*  --   CREATININE 1.11 1.04 1.14 1.13 0.99  --   CALCIUM 8.3* 8.8* 8.8* 9.0 9.5  --   MG 2.3 2.0 2.0 2.0 2.2 2.0  PHOS 2.6 2.6 2.7 3.1 3.7 4.3   GFR: Estimated Creatinine Clearance: 89.8 mL/min (by C-G formula based on SCr of 0.99 mg/dL). Liver Function Tests: No results for input(s): AST, ALT, ALKPHOS, BILITOT, PROT, ALBUMIN in the last 168 hours. No results for input(s): LIPASE, AMYLASE in the last 168 hours. No results for input(s): AMMONIA in the last 168 hours. Coagulation Profile: Recent Labs  Lab 06/22/20 1241  INR 1.3*   Cardiac Enzymes: No results for input(s): CKTOTAL, CKMB, CKMBINDEX, TROPONINI in the last 168 hours. BNP (last 3 results) No results for input(s): PROBNP in the last 8760 hours. HbA1C: No results for input(s): HGBA1C in the last 72 hours. CBG: Recent Labs  Lab 06/27/20 1623 06/27/20 1923 06/27/20 2312 06/28/20 0304 06/28/20 0739  GLUCAP 133* 135* 108* 102* 88   Lipid Profile: No results for input(s): CHOL, HDL, LDLCALC, TRIG, CHOLHDL, LDLDIRECT in the last 72 hours. Thyroid Function Tests: No results for input(s): TSH, T4TOTAL, FREET4, T3FREE,  THYROIDAB in the last 72 hours. Anemia Panel: No results for input(s): VITAMINB12, FOLATE, FERRITIN, TIBC, IRON, RETICCTPCT in the last 72 hours. Sepsis Labs: No results for input(s): PROCALCITON, LATICACIDVEN in the last 168 hours.  No results found for this or any previous visit (from the past 240 hour(s)).       Radiology Studies: No results found.      Scheduled Meds: . apixaban  5 mg Oral BID  . atorvastatin  80 mg Oral Daily  . Chlorhexidine Gluconate Cloth  6 each Topical Daily  . digoxin  0.125 mg Oral Daily  . DULoxetine  30 mg Oral Daily  . ezetimibe  10 mg Oral Daily  . furosemide  60 mg  Intravenous BID  . gabapentin  400 mg Oral QID  . insulin aspart  0-20 Units Subcutaneous Q4H  . midodrine  2.5 mg Oral TID WC  . mometasone-formoterol  2 puff Inhalation BID  . multivitamin with minerals  1 tablet Oral Daily  . pantoprazole  40 mg Oral Daily  . sodium chloride flush  3 mL Intravenous Q12H  . spironolactone  25 mg Oral Daily  . tamsulosin  0.4 mg Oral QPC supper  . traZODone  150 mg Oral QHS  . umeclidinium bromide  1 puff Inhalation Daily   Continuous Infusions: . sodium chloride Stopped (06/24/20 1400)     LOS: 11 days    Time spent: 27 minutes    Sharen Hones, MD [facility]   To contact the attending provider between 7A-7P or the covering provider during after hours 7P-7A, please log into the web site [email or web address] and access using universal [place] password for that web site. If you do not have the password, please call the hospital operator.  06/28/2020, 9:00 AM

## 2020-06-28 NOTE — Progress Notes (Signed)
Physical Therapy Treatment Patient Details Name: [name] [name] MRN: [id number] DOB: 1960/11/27 Today's Date: 06/28/2020    History of Present Illness Pt is a 60 y/o M with PMH: Arthritis, Asthma, Atherosclerosis, BPH, Charcot's joint of foot-right, Chronic combined systolic and diastolic heart failure, COPD, CAD/PCI, tobacco abuse, PAD s/p L SFA stenting, s/p R BKA, afib on Eliquis, OSA noncompliant with CPAP, and h/o leaving AMA/medical noncompliance. Pt presented d/t SOB, DOE and fluid retention in LEs. Trop slightly elevated, Cardiology note indicates: "patient declined CABG in the past at [facility]; He had a cath in August showing no great PCI targets but considered atherectomy of Lcx however patient eventually left AMA, No plan for ischemic work-up at this time". Admitted acutely for management of acute/chronic CHF, requiring for milrinone drip.    PT Comments    Pt resting in bed upon PT arrival; pt initially declining PT session d/t not feeling up to it but then agreeable.  SBA with bed mobility; CGA to min assist with transfers with RW (x3 trials from bed); and CGA to min assist with ambulation 3 feet forward and then 3 feet backwards with RW and R LE prosthesis (x2 trials).  Pt requiring sitting rest breaks between activities d/t fatigue.  Will continue to focus on strengthening and progressive functional mobility per pt tolerance.   Follow Up Recommendations  SNF     Equipment Recommendations   (may benefit from hospital bed)    Recommendations for Other Services       Precautions / Restrictions Precautions Precautions: Fall Restrictions Weight Bearing Restrictions: No RLE Weight Bearing:  (BKA with prosthesis) Other Position/Activity Restrictions: monitor HR; hx of R BKA, wears prosthesis.    Mobility  Bed Mobility Overal bed mobility: Needs Assistance Bed Mobility: Supine to Sit;Sit to Supine     Supine to sit: Supervision;HOB elevated Sit to supine: Supervision;HOB  elevated   General bed mobility comments: mild increased effort to perform on own  Transfers Overall transfer level: Needs assistance Equipment used: Rolling walker (2 wheeled) Transfers: Sit to/from Stand Sit to Stand: Min guard;Min assist         General transfer comment: x3 trials standing from bed; initial vc's for UE placement; pt using some momentum to stand  Ambulation/Gait Ambulation/Gait assistance: Min guard;Min assist Gait Distance (Feet):  (3 feet forward and then 3 feet backwards (x2 trials)) Assistive device: Rolling walker (2 wheeled)   Gait velocity: decreased   General Gait Details: partial step through gait pattern; mildly shaky   Stairs             Wheelchair Mobility    Modified Rankin (Stroke Patients Only)       Balance Overall balance assessment: Needs assistance Sitting-balance support: No upper extremity supported;Feet supported Sitting balance-Leahy Scale: Good Sitting balance - Comments: steady sitting reaching within BOS   Standing balance support: Bilateral upper extremity supported;During functional activity Standing balance-Leahy Scale: Fair Standing balance comment: requires B UE support on RW for static and dynamic standing activities                            Cognition Arousal/Alertness: Awake/alert Behavior During Therapy: WFL for tasks assessed/performed Overall Cognitive Status: Within Functional Limits for tasks assessed  Exercises      General Comments   Nursing cleared pt for participation in physical therapy.      Pertinent Vitals/Pain Pain Assessment: 0-10 Pain Score: 8  Pain Location: L ankle pain Pain Descriptors / Indicators: Aching Pain Intervention(s): Limited activity within patient's tolerance;Monitored during session;Repositioned;Patient requesting pain meds-RN notified  Vitals (HR and O2 on room air) stable and WFL throughout  treatment session.    Home Living                      Prior Function            PT Goals (current goals can now be found in the care plan section) Acute Rehab PT Goals Patient Stated Goal: to go home PT Goal Formulation: With patient Time For Goal Achievement: 07/05/20 Potential to Achieve Goals: Fair Progress towards PT goals: Progressing toward goals    Frequency    Min 2X/week      PT Plan Current plan remains appropriate    Co-evaluation              AM-PAC PT "6 Clicks" Mobility   Outcome Measure  Help needed turning from your back to your side while in a flat bed without using bedrails?: A Little Help needed moving from lying on your back to sitting on the side of a flat bed without using bedrails?: A Little Help needed moving to and from a bed to a chair (including a wheelchair)?: A Little Help needed standing up from a chair using your arms (e.g., wheelchair or bedside chair)?: A Little Help needed to walk in hospital room?: A Little Help needed climbing 3-5 steps with a railing? : Total 6 Click Score: 16    End of Session Equipment Utilized During Treatment: Gait belt;Other (comment) (R LE prosthesis) Activity Tolerance: Patient limited by fatigue Patient left: in bed;with call bell/phone within reach;with bed alarm set Nurse Communication: Mobility status;Precautions;Patient requests pain meds;Other (comment) (NT notified pt requesting blood sugar check prior to eating dinner) PT Visit Diagnosis: Muscle weakness (generalized) (M62.81);Difficulty in walking, not elsewhere classified (R26.2)     Time: [id number] PT Time Calculation (min) (ACUTE ONLY): 25 min  Charges:  $Therapeutic Activity: 23-37 mins                    Leitha Bleak, PT 06/28/20, 4:47 PM

## 2020-06-28 NOTE — Progress Notes (Signed)
While rounding, met with patient briefly. He states he is feeling so much better today and looking forward to being moved out of ICU today, 06/28/2020. Offered him words of encouragement and reinforced ana agreement we made my first visit. The agreement was that he would keep his mind strong. Regardless of what is going on in his body. He was able to smile and shared very positive and pleasant thoughts.

## 2020-06-28 NOTE — Progress Notes (Signed)
Daily Progress Note   Patient Name: [name] [name]       Date: 06/28/2020 DOB: [birth year]  Age: 60 y.o. MRN#: [id number] Attending Physician: Sharen Hones, MD Primary Care Physician: [name], MD Admit Date: 06/17/2020  Reason for Consultation/Follow-up: Establishing goals of care  Subjective: Patient is sitting in bed.  He states he feels much better than he has previously.  He tells me that he is aware that he will have to make lifestyle modifications upon discharge from the hospital to prolong his life.  Discussed the goals of quantity and quality of life.  He states he wants to continue to try to treat the treatable and would want to return to the hospital if needed.  He states that he will have to see how things go and make decisions along the way.  Length of Stay: 11  Current Medications: Scheduled Meds:  . apixaban  5 mg Oral BID  . atorvastatin  80 mg Oral Daily  . Chlorhexidine Gluconate Cloth  6 each Topical Daily  . digoxin  0.125 mg Oral Daily  . DULoxetine  30 mg Oral Daily  . ezetimibe  10 mg Oral Daily  . furosemide  60 mg Intravenous BID  . gabapentin  400 mg Oral QID  . insulin aspart  0-20 Units Subcutaneous Q4H  . midodrine  2.5 mg Oral TID WC  . mometasone-formoterol  2 puff Inhalation BID  . multivitamin with minerals  1 tablet Oral Daily  . pantoprazole  40 mg Oral Daily  . sodium chloride flush  3 mL Intravenous Q12H  . spironolactone  25 mg Oral Daily  . tamsulosin  0.4 mg Oral QPC supper  . traZODone  150 mg Oral QHS  . umeclidinium bromide  1 puff Inhalation Daily    Continuous Infusions: . sodium chloride Stopped (06/24/20 1400)    PRN Meds: sodium chloride, acetaminophen, Ipratropium-Albuterol, ipratropium-albuterol, nitroGLYCERIN, ondansetron  (ZOFRAN) IV, oxyCODONE, sodium chloride flush  Physical Exam Pulmonary:     Effort: Pulmonary effort is normal.  Neurological:     Mental Status: He is alert.             Vital Signs: BP 111/73 (BP Location: Right Arm)   Pulse 87   Temp (!) 97.3 F (36.3 C) (Axillary)   Resp 17  Ht '5\' 7"'$  (1.702 m)   Wt 93.6 kg   SpO2 96%   BMI 32.32 kg/m  SpO2: SpO2: 96 % O2 Device: O2 Device: Nasal Cannula O2 Flow Rate: O2 Flow Rate (L/min): 2 L/min  Intake/output summary:   Intake/Output Summary (Last 24 hours) at 06/28/2020 1345 Last data filed at 06/28/2020 1237 Gross per 24 hour  Intake 378 ml  Output 3550 ml  Net -3172 ml   LBM: Last BM Date: 06/27/20 Baseline Weight: Weight: 94.8 kg Most recent weight: Weight: 93.6 kg       Palliative Assessment/Data:    Flowsheet Rows   Flowsheet Row Most Recent Value  Intake Tab   Referral Department Critical care  Unit at Time of Referral ER  Palliative Care Primary Diagnosis Cardiac  Date Notified 06/18/20  Palliative Care Type New Palliative care  Reason for referral Clarify Goals of Care  Date of Admission 06/17/20  Date first seen by Palliative Care 06/19/20  # of days Palliative referral response time 1 Day(s)  # of days IP prior to Palliative referral 1  Clinical Assessment   Psychosocial & Spiritual Assessment   Palliative Care Outcomes       Patient Active Problem List   Diagnosis Date Noted  . Acute on chronic heart failure ([place])   . CHF (congestive heart failure) ([place]) 06/18/2020  . CHF exacerbation ([place]) 06/17/2020  . Chronic anticoagulation 06/17/2020  . Chronic venous insufficiency 02/10/2020  . HFrEF (heart failure with reduced ejection fraction) ([place]) 01/30/2020  . COPD with acute exacerbation ([place]) 01/30/2020  . Persistent atrial fibrillation ([place]) 01/19/2020  . Chronic bilateral low back pain with sciatica 10/08/2019  . Medication management contract signed 10/08/2019  . Cellulitis of left lower leg  09/27/2019  . Cellulitis of foot, left 09/27/2019  . Chronic hyponatremia 09/27/2019  . Cellulitis of left leg 08/31/2019  . CAD (coronary artery disease) 08/31/2019  . Acute on chronic systolic CHF (congestive heart failure) ([place]) 11/05/2018  . Open toe wound 10/01/2018  . Venous ulcer of left leg ([place]) 08/19/2018  . 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[place]) 04/28/2018  . Sepsis ([place]) 03/30/2018  . Abnormal MRI, shoulder (Right) 02/23/2018  . Abnormal MRI, cervical spine (2016) 02/23/2018  . DDD (degenerative disc disease), cervical 02/23/2018  . Cervical foraminal stenosis (Bilateral) 02/23/2018  . Cervical central spinal stenosis 02/23/2018  . Cervical facet hypertrophy 02/23/2018  . Arthralgia of acromioclavicular joint (Right) 02/23/2018  . Osteoarthritis of  AC (acromioclavicular) joint (Right) 02/23/2018  . Biceps tendinosis of shoulder (Right) 02/23/2018  . Tendinopathy of rotator cuff (Right) 02/23/2018  . Vitamin D deficiency 02/23/2018  . Atherosclerosis of native arteries of the extremities with ulceration ([place]) 02/15/2018  . Peripheral edema 02/05/2018  . Status post peripherally inserted central catheter (PICC) central line placement 02/04/2018  . Chronic lower extremity pain (Primary Area of Pain) (Right) 01/29/2018  . Chronic shoulder pain [facility] Area of Pain) (Right) 01/29/2018  . Chronic foot pain (Secondary Area of Pain) (Right) 01/29/2018  . Chronic pain syndrome 01/29/2018  . Long term current use of opiate analgesic 01/29/2018  . Medication monitoring encounter 01/29/2018  . Disorder of skeletal system 01/29/2018  . Problems influencing health status 01/29/2018  . AKI (acute kidney injury) ([place]) 01/03/2018  . NSTEMI (non-ST elevated myocardial infarction) ([place]) 11/21/2017  . Coronary artery disease of native artery of native heart with stable angina pectoris ([place]) 09/10/2017  . HLD (hyperlipidemia) 09/10/2017  . Angina pectoris ([place]) 09/08/2017   .  Angina at rest [facility]) 08/13/2017  . Lymphedema 05/02/2017  . Chronic systolic CHF (congestive heart failure) ([place]) 04/23/2017  . Chest pain 07/11/2016  . Unstable angina ([place]) 07/11/2016  . Foot ulcer (Right)   . Stable angina (HCC)   . Pressure injury of skin 05/06/2016  . Diabetic foot infection ([place]) 05/05/2016  . Type 2 diabetes mellitus with other specified complication ([place]) [id number]  . Cellulitis 11/21/2015  . Morbid obesity ([place]) 11/17/2014  . PAD (peripheral artery disease) ([name])   . Hypertension   . Tobacco use   . COPD (chronic obstructive pulmonary disease) ([place]) 05/19/2012  . Diabetic neuropathy ([place]) 05/19/2012  . Peripheral vascular disease ([place]) 03/27/2012    Palliative Care Assessment & Plan   Recommendations/Plan:  Continue current care    Code Status:    Code Status Orders  (From admission, onward)         Start     Ordered   06/19/20 1029  Do not attempt resuscitation (DNR)  Continuous       Question Answer Comment  In the event of cardiac or respiratory ARREST Do not call a "code blue"   In the event of cardiac or respiratory ARREST Do not perform Intubation, CPR, defibrillation or ACLS   In the event of cardiac or respiratory ARREST Use medication by any route, position, wound care, and other measures to relive pain and suffering. May use oxygen, suction and manual treatment of airway obstruction as needed for comfort.      06/19/20 1028        Code Status History    Date Active Date Inactive Code Status Order ID Comments User Context   06/17/2020 0509 06/19/2020 1028 Full Code [id number]  Athena Masse, MD ED   01/19/2020 1139 01/21/2020 0030 Full Code [id number]  Ivor Costa, MD ED   09/27/2019 0427 09/30/2019 1952 Full Code [id number]  Athena Masse, MD ED   08/31/2019 0109 08/31/2019 0742 Full Code [id number]  Athena Masse, MD ED   08/02/2019 1147 08/02/2019 1835 Full Code [id number]  Algernon Huxley, MD Inpatient   06/14/2019 1103 06/14/2019 1851  Full Code [id number]  Algernon Huxley, MD Inpatient   11/06/2018 0023 11/09/2018 1816 Full Code [id number]  Lance Coon, MD ED   08/24/2018 0939 08/24/2018 1444 Full Code [id number]  Algernon Huxley, MD Inpatient   07/27/2018 2248 07/28/2018 1836 Full Code [id number]  Vaughan Basta, MD Inpatient   03/30/2018 1839 04/07/2018 2116 Full Code [id number]  Nicholes Mango, MD Inpatient   03/03/2018 1044 03/03/2018 1545 Full Code [id number]  Schnier, Dolores Lory, MD Inpatient   01/03/2018 0409 01/09/2018 1723 Full Code [id number]  Lance Coon, MD Inpatient   12/22/2017 0607 12/23/2017 1916 Full Code [id number]  Harrie Foreman, MD Inpatient   11/21/2017 0415 11/23/2017 1444 Full Code [id number]  Lance Coon, MD Inpatient   04/23/2017 0355 04/29/2017 1353 Full Code [id number]  Saundra Shelling, MD Inpatient   07/11/2016 0324 07/11/2016 1851 Full Code [id number]  Saundra Shelling, MD Inpatient   05/06/2016 0005 05/07/2016 1417 Full Code [id number]  Hugelmeyer, [place], DO Inpatient   11/21/2015 1716 11/22/2015 1950 Full Code [id number]  Gladstone Lighter, MD Inpatient   07/06/2015 0948 07/06/2015 1437 Full Code [id number]  Algernon Huxley, MD Inpatient   Advance Care Planning Activity    Advance Directive Documentation   Flowsheet Row Most Recent Value  Type of Advance Directive Healthcare Power of Attorney, Living will  Pre-existing out of facility DNR order (yellow  form or pink MOST form) -  "MOST" Form in Place? -       Prognosis:  Poor overall  Thank you for allowing the Palliative Medicine Team to assist in the care of this patient.   Total Time 15 min Prolonged Time Billed no      Greater than 50%  of this time was spent counseling and coordinating care related to the above assessment and plan.  Asencion Gowda, NP  Please contact Palliative Medicine Team phone at [phone] for questions and concerns.

## 2020-06-29 ENCOUNTER — Other Ambulatory Visit: Payer: Medicare Other | Admitting: Primary Care

## 2020-06-29 DIAGNOSIS — I255 Ischemic cardiomyopathy: Secondary | ICD-10-CM

## 2020-06-29 DIAGNOSIS — I5023 Acute on chronic systolic (congestive) heart failure: Secondary | ICD-10-CM | POA: Diagnosis not present

## 2020-06-29 DIAGNOSIS — J432 Centrilobular emphysema: Secondary | ICD-10-CM | POA: Diagnosis not present

## 2020-06-29 DIAGNOSIS — J810 Acute pulmonary edema: Secondary | ICD-10-CM | POA: Diagnosis not present

## 2020-06-29 DIAGNOSIS — N179 Acute kidney failure, unspecified: Secondary | ICD-10-CM | POA: Diagnosis not present

## 2020-06-29 LAB — BASIC METABOLIC PANEL
Anion gap: 12 (ref 5–15)
BUN: 36 mg/dL — ABNORMAL HIGH (ref 6–20)
CO2: 35 mmol/L — ABNORMAL HIGH (ref 22–32)
Calcium: 9.1 mg/dL (ref 8.9–10.3)
Chloride: 89 mmol/L — ABNORMAL LOW (ref 98–111)
Creatinine, Ser: 1.1 mg/dL (ref 0.61–1.24)
GFR, Estimated: >60 mL/min (ref >60)
Glucose, Bld: 118 mg/dL — ABNORMAL HIGH (ref 70–99)
Potassium: 3.7 mmol/L (ref 3.5–5.1)
Sodium: 136 mmol/L (ref 135–145)

## 2020-06-29 LAB — CBC WITH DIFFERENTIAL/PLATELET
Abs Immature Granulocytes: 0.07 10*3/uL (ref 0.00–0.07)
Basophils Absolute: 0 10*3/uL (ref 0.0–0.1)
Basophils Relative: 0 %
Eosinophils Absolute: 0.1 10*3/uL (ref 0.0–0.5)
Eosinophils Relative: 1 %
HCT: 39.1 % (ref 39.0–52.0)
Hemoglobin: 12.7 g/dL — ABNORMAL LOW (ref 13.0–17.0)
Immature Granulocytes: 1 %
Lymphocytes Relative: 10 %
Lymphs Abs: 1.1 10*3/uL (ref 0.7–4.0)
MCH: 26.1 pg (ref 26.0–34.0)
MCHC: 32.5 g/dL (ref 30.0–36.0)
MCV: 80.5 fL (ref 80.0–100.0)
Monocytes Absolute: 1.1 10*3/uL — ABNORMAL HIGH (ref 0.1–1.0)
Monocytes Relative: 10 %
Neutro Abs: 8.6 10*3/uL — ABNORMAL HIGH (ref 1.7–7.7)
Neutrophils Relative %: 78 %
Platelets: 159 10*3/uL (ref 150–400)
RBC: 4.86 MIL/uL (ref 4.22–5.81)
RDW: 20.7 % — ABNORMAL HIGH (ref 11.5–15.5)
WBC: 10.9 10*3/uL — ABNORMAL HIGH (ref 4.0–10.5)
nRBC: 0 % (ref 0.0–0.2)

## 2020-06-29 LAB — GLUCOSE, CAPILLARY
Glucose-Capillary: 122 mg/dL — ABNORMAL HIGH (ref 70–99)
Glucose-Capillary: 127 mg/dL — ABNORMAL HIGH (ref 70–99)
Glucose-Capillary: 155 mg/dL — ABNORMAL HIGH (ref 70–99)
Glucose-Capillary: 176 mg/dL — ABNORMAL HIGH (ref 70–99)
Glucose-Capillary: 195 mg/dL — ABNORMAL HIGH (ref 70–99)
Glucose-Capillary: 80 mg/dL (ref 70–99)

## 2020-06-29 LAB — MAGNESIUM: Magnesium: 2.1 mg/dL (ref 1.7–2.4)

## 2020-06-29 LAB — PHOSPHORUS: Phosphorus: 4.2 mg/dL (ref 2.5–4.6)

## 2020-06-29 MED ORDER — EMPAGLIFLOZIN 10 MG PO TABS
10.0000 mg | ORAL_TABLET | Freq: Every day | ORAL | Status: DC
  Administered 2020-06-29 – 2020-07-01 (×3): 10 mg via ORAL
  Filled 2020-06-29 (×3): qty 1

## 2020-06-29 NOTE — Progress Notes (Signed)
PROGRESS NOTE    [name] [name]  [id number] DOB: Jul 01, 1960 DOA: 06/17/2020 PCP: [name], MD   Chief complaint.  Shortness of breath. Brief Narrative:  60 y.o.malewith a hx of ICM EF 20-25%, CAD/PCI, tobacco abuse, COPD, PAD s/p L SFA stenting, s/p R BKA, afib on Eliquis, OSA noncompliant with CPAP, hx of leaving AMA and noncompliancewho is being seen today for the evaluation ofheart failure.presents to the emergency room with shortness of breath, fluid retention in his legs and intermittent chest pain.  Admitted to the ICU for milrinone drip and lasix drip.  Stabilized from an ICU standpoint. Milrinone gtt. has been discontinued. Lasix GTT has been discontinued and transition to twice daily. Patient 27 L net negative since admission. Cardiology following closely. No longer any ICU needs. Care assumed by [facility] hospitalist service.   Assessment & Plan:   Principal Problem:   Acute on chronic systolic CHF (congestive heart failure) (HCC) Active Problems:   Coronary artery disease of native artery of native heart with stable angina pectoris ([place])   Hypertension   Type 2 diabetes mellitus with other specified complication (HCC)   AKI (acute kidney injury) ([place])   COPD (chronic obstructive pulmonary disease) ([place])   Long term current use of opiate analgesic   Chronic hyponatremia   Persistent atrial fibrillation (HCC)   CHF exacerbation (HCC)   Chronic anticoagulation   CHF (congestive heart failure) ([place])   Acute on chronic heart failure ([place])  #1.  Cardiogenic shock. Acute on chronic systolic congestive heart failure with ejection less than 20%. Patient condition improved, approaching euvolemia status, still on 60 mg IV Lasix twice a day per cardiology.  2.  Acute kidney injury. Condition had improved.  3.  Persistent atrial fibrillation. Continue rate control and Eliquis.  4.  COPD. Stable.    DVT prophylaxis: Eliquis Code Status: DNR Family  Communication:  Disposition Plan:  .   Status is: Inpatient  Remains inpatient appropriate because:Inpatient level of care appropriate due to severity of illness   Dispo: The patient is from: Home              Anticipated d/c is to: SNF              Anticipated d/c date is: 2 days              Patient currently is not medically stable to d/c.        I/O last 3 completed shifts: In: 778 [P.O.:775; I.V.:3] Out: 4000 [Urine:4000] Total I/O In: -  Out: 500 [Urine:500]     Consultants:   Cardiology  Procedures: None  Antimicrobials: None  Subjective: Patient condition much improved, currently patient does not have any short of breath.  He is off oxygen. Denies any abdominal pain.  No nausea vomiting. Had a bowel movement yesterday. No dysuria or hematuria. No fever or chills. No chest pain or palpitation  Objective: Vitals:   06/29/20 0424 06/29/20 0735 06/29/20 0739 06/29/20 0838  BP: 106/70  128/78 (!) 86/71  Pulse: 61  80 71  Resp:  18 18   Temp:   97.7 F (36.5 C)   TempSrc:      SpO2:   94% 97%  Weight:      Height:        Intake/Output Summary (Last 24 hours) at 06/29/2020 0845 Last data filed at 06/29/2020 0739 Gross per 24 hour  Intake 778 ml  Output 2900 ml  Net -2122 ml  Filed Weights   06/23/20 0419 06/28/20 0500 06/29/20 0219  Weight: 93.6 kg 93.6 kg 83.5 kg    Examination:  General exam: Appears calm and comfortable  Respiratory system: Clear to auscultation. Respiratory effort normal. Cardiovascular system: Irregular. No JVD, murmurs, rubs, gallops or clicks. No pedal edema. Gastrointestinal system: Abdomen is nondistended, soft and nontender. No organomegaly or masses felt. Normal bowel sounds heard. Central nervous system: Alert and oriented. No focal neurological deficits. Extremities: Symmetric  Skin: No rashes, lesions or ulcers Psychiatry: Judgement and insight appear normal. Mood & affect appropriate.     Data Reviewed:  I have personally reviewed following labs and imaging studies  CBC: Recent Labs  Lab 06/25/20 0347 06/26/20 0514 06/27/20 0513 06/28/20 0631 06/29/20 0349  WBC 6.4 8.5 9.8 10.1 10.9*  NEUTROABS 5.3 7.2 8.1* 8.0* 8.6*  HGB 11.8* 12.4* 13.1 12.8* 12.7*  HCT 36.6* 37.1* 39.4 39.6 39.1  MCV 81.0 81.0 81.2 81.5 80.5  PLT 111* 115* 128* 137* [id number]   Basic Metabolic Panel: Recent Labs  Lab 06/24/20 0508 06/25/20 0347 06/26/20 0514 06/27/20 0513 06/28/20 0631 06/28/20 0939 06/29/20 0349  NA 136 136 134* 136  --  136  --   K 3.8 3.7 3.9 4.3  --  4.0  --   CL 88* 87* 88* 86*  --  89*  --   CO2 39* 40* 39* 38*  --  37*  --   GLUCOSE 91 151* 155* 104*  --  149*  --   BUN 35* 33* 34* 35*  --  34*  --   CREATININE 1.04 1.14 1.13 0.99  --  1.06  --   CALCIUM 8.8* 8.8* 9.0 9.5  --  9.3  --   MG 2.0 2.0 2.0 2.2 2.0  --  2.1  PHOS 2.6 2.7 3.1 3.7 4.3  --  4.2   GFR: Estimated Creatinine Clearance: 79.5 mL/min (by C-G formula based on SCr of 1.06 mg/dL). Liver Function Tests: No results for input(s): AST, ALT, ALKPHOS, BILITOT, PROT, ALBUMIN in the last 168 hours. No results for input(s): LIPASE, AMYLASE in the last 168 hours. No results for input(s): AMMONIA in the last 168 hours. Coagulation Profile: Recent Labs  Lab 06/22/20 1241  INR 1.3*   Cardiac Enzymes: No results for input(s): CKTOTAL, CKMB, CKMBINDEX, TROPONINI in the last 168 hours. BNP (last 3 results) No results for input(s): PROBNP in the last 8760 hours. HbA1C: No results for input(s): HGBA1C in the last 72 hours. CBG: Recent Labs  Lab 06/28/20 1633 06/28/20 1940 06/29/20 0023 06/29/20 0333 06/29/20 0737  GLUCAP 144* 150* 195* 127* 80   Lipid Profile: No results for input(s): CHOL, HDL, LDLCALC, TRIG, CHOLHDL, LDLDIRECT in the last 72 hours. Thyroid Function Tests: No results for input(s): TSH, T4TOTAL, FREET4, T3FREE, THYROIDAB in the last 72 hours. Anemia Panel: No results for input(s): VITAMINB12,  FOLATE, FERRITIN, TIBC, IRON, RETICCTPCT in the last 72 hours. Sepsis Labs: No results for input(s): PROCALCITON, LATICACIDVEN in the last 168 hours.  No results found for this or any previous visit (from the past 240 hour(s)).       Radiology Studies: No results found.      Scheduled Meds: . apixaban  5 mg Oral BID  . atorvastatin  80 mg Oral Daily  . digoxin  0.125 mg Oral Daily  . DULoxetine  30 mg Oral Daily  . ezetimibe  10 mg Oral Daily  . furosemide  60 mg Intravenous BID  .  gabapentin  400 mg Oral QID  . insulin aspart  0-20 Units Subcutaneous Q4H  . mometasone-formoterol  2 puff Inhalation BID  . multivitamin with minerals  1 tablet Oral Daily  . pantoprazole  40 mg Oral Daily  . sodium chloride flush  3 mL Intravenous Q12H  . spironolactone  12.5 mg Oral Daily  . tamsulosin  0.4 mg Oral QPC supper  . traZODone  150 mg Oral QHS  . umeclidinium bromide  1 puff Inhalation Daily   Continuous Infusions: . sodium chloride Stopped (06/24/20 1400)     LOS: 12 days    Time spent: 28 minutes    Sharen Hones, MD [facility]   To contact the attending provider between 7A-7P or the covering provider during after hours 7P-7A, please log into the web site [email or web address] and access using universal [place] password for that web site. If you do not have the password, please call the hospital operator.  06/29/2020, 8:45 AM

## 2020-06-29 NOTE — Progress Notes (Signed)
Occupational Therapy Treatment Patient Details Name: [name] [name] MRN: [id number] DOB: 16-Dec-1960 Today's Date: 06/29/2020    History of present illness Pt is a 60 y/o M with PMH: Arthritis, Asthma, Atherosclerosis, BPH, Charcot's joint of foot-right, Chronic combined systolic and diastolic heart failure, COPD, CAD/PCI, tobacco abuse, PAD s/p L SFA stenting, s/p R BKA, afib on Eliquis, OSA noncompliant with CPAP, and h/o leaving AMA/medical noncompliance. Pt presented d/t SOB, DOE and fluid retention in LEs. Trop slightly elevated, Cardiology note indicates: "patient declined CABG in the past at [facility]; He had a cath in August showing no great PCI targets but considered atherectomy of Lcx however patient eventually left AMA, No plan for ischemic work-up at this time". Admitted acutely for management of acute/chronic CHF, requiring for milrinone drip.   OT comments  [name] states that he "can tell I am making progress," and indeed appears much more active and alert today. While he required "max effort to motivate to participate" in earlier rehab sessions, today [name] is eager to engage, is Mod I in bed mobility, engages in grooming and UB bathing tasks from EOB sitting, with only set-up assistance required. HR remains in normal range with exertion. Pt reports he has "no more pain than usual;" he is pleasant, talkative, A&O x 4. Will continue OT while patient is hospitalized, modify D/C plan to [facility].   Follow Up Recommendations  Home health OT    Equipment Recommendations       Recommendations for Other Services      Precautions / Restrictions Precautions Precautions: Fall Restrictions Other Position/Activity Restrictions: monitor HR; hx of R BKA, wears prosthesis; wound on LLE       Mobility Bed Mobility Overal bed mobility: Needs Assistance Bed Mobility: Supine to Sit;Sit to Supine     Supine to sit: Modified independent (Device/Increase time) Sit to supine: Modified  independent (Device/Increase time);Min assist   General bed mobility comments: Mod I (increased time) for supine<>sit; Min A for supine scooting towards HOB  Transfers                 General transfer comment: Pt declined OOB activity today    Balance Overall balance assessment: Modified Independent Sitting-balance support: Feet unsupported;No upper extremity supported Sitting balance-Leahy Scale: Good Sitting balance - Comments: steady sitting balance reaching w/in BOS                                   ADL either performed or assessed with clinical judgement   ADL Overall ADL's : Needs assistance/impaired Eating/Feeding: Independent   Grooming: Wash/dry hands;Wash/dry face;Oral care;Applying deodorant;Brushing hair;Modified independent;Set up;Sitting Grooming Details (indicate cue type and reason): sitting EOB Upper Body Bathing: Modified independent;Set up;Sitting Upper Body Bathing Details (indicate cue type and reason): sitting EOB                                 Vision Patient Visual Report: No change from baseline     Perception     Praxis      Cognition Arousal/Alertness: Awake/alert Behavior During Therapy: WFL for tasks assessed/performed Overall Cognitive Status: Within Functional Limits for tasks assessed  Exercises Other Exercises Other Exercises: bed mobility, grooming, UB bathing   Shoulder Instructions       General Comments oozing wound on L LE    Pertinent Vitals/ Pain       Pain Assessment: 0-10 Pain Location: L ankle pain Pain Intervention(s): Limited activity within patient's tolerance;Monitored during session  Home Living                                          Prior Functioning/Environment              Frequency  Min 1X/week        Progress Toward Goals  OT Goals(current goals can now be found in the care plan  section)  Progress towards OT goals: Progressing toward goals  Acute Rehab OT Goals Patient Stated Goal: to go home OT Goal Formulation: With patient Time For Goal Achievement: 07/03/20 Potential to Achieve Goals: Good  Plan Discharge plan needs to be updated;Frequency remains appropriate    Co-evaluation                 AM-PAC OT "6 Clicks" Daily Activity     Outcome Measure   Help from another person eating meals?: None Help from another person taking care of personal grooming?: A Little Help from another person toileting, which includes using toliet, bedpan, or urinal?: A Lot Help from another person bathing (including washing, rinsing, drying)?: A Lot Help from another person to put on and taking off regular upper body clothing?: A Little Help from another person to put on and taking off regular lower body clothing?: A Lot 6 Click Score: 16    End of Session    OT Visit Diagnosis: Unsteadiness on feet (R26.81);Muscle weakness (generalized) (M62.81)   Activity Tolerance Patient tolerated treatment well   Patient Left in bed;with call bell/phone within reach   Nurse Communication          Time: [phone] OT Time Calculation (min): 30 min  Charges: OT General Charges $OT Visit: 1 Visit OT Treatments $Self Care/Home Management : 23-37 mins  Josiah Lobo, PhD, [place], OTR/L ascom [phone] 06/29/20, 10:13 AM

## 2020-06-29 NOTE — Progress Notes (Signed)
Progress Note  Patient Name: [name] [name] Date of Encounter: 06/29/2020  Primary Cardiologist: Ida Rogue, MD  Subjective   Breathing continues to improve. Continues to respond well to intravenous Lasix off of milrinone. Overall feeling much better.  Inpatient Medications    Scheduled Meds: . apixaban  5 mg Oral BID  . atorvastatin  80 mg Oral Daily  . digoxin  0.125 mg Oral Daily  . DULoxetine  30 mg Oral Daily  . ezetimibe  10 mg Oral Daily  . furosemide  60 mg Intravenous BID  . gabapentin  400 mg Oral QID  . insulin aspart  0-20 Units Subcutaneous Q4H  . mometasone-formoterol  2 puff Inhalation BID  . multivitamin with minerals  1 tablet Oral Daily  . pantoprazole  40 mg Oral Daily  . sodium chloride flush  3 mL Intravenous Q12H  . spironolactone  12.5 mg Oral Daily  . tamsulosin  0.4 mg Oral QPC supper  . traZODone  150 mg Oral QHS  . umeclidinium bromide  1 puff Inhalation Daily   Continuous Infusions: . sodium chloride Stopped (06/24/20 1400)   PRN Meds: sodium chloride, acetaminophen, Ipratropium-Albuterol, ipratropium-albuterol, nitroGLYCERIN, ondansetron (ZOFRAN) IV, oxyCODONE, sodium chloride flush   Vital Signs    Vitals:   06/29/20 0424 06/29/20 0735 06/29/20 0739 06/29/20 0838  BP: 106/70  128/78 (!) 86/71  Pulse: 61  80 71  Resp:  18 18   Temp:   97.7 F (36.5 C)   TempSrc:      SpO2:   94% 97%  Weight:      Height:        Intake/Output Summary (Last 24 hours) at 06/29/2020 0919 Last data filed at 06/29/2020 0739 Gross per 24 hour  Intake 778 ml  Output 2900 ml  Net -2122 ml   Filed Weights   06/23/20 0419 06/28/20 0500 06/29/20 0219  Weight: 93.6 kg 93.6 kg 83.5 kg    Physical Exam   GEN: Well nourished, well developed, in no acute distress.  HEENT: Grossly normal.  Neck: Supple, no JVD, carotid bruits, or masses. Cardiac: IR IR, 2/6 systolic murmur left lower sternal border to apex. No rubs, or gallops. No clubbing,  cyanosis, edema.  Radials 2+, left DP/PT 1+ and equal bilaterally. Right BKA. Respiratory:  Respirations regular and unlabored, clear to auscultation bilaterally. GI: Soft, nontender, nondistended, trace bilateral flank edema, BS + x 4. MS: no deformity or atrophy. Skin: warm and dry, no rash. Neuro:  Strength and sensation are intact. Psych: AAOx3.  Normal affect.  Labs    Chemistry Recent Labs  Lab 06/26/20 0514 06/27/20 0513 06/28/20 0939  NA 134* 136 136  K 3.9 4.3 4.0  CL 88* 86* 89*  CO2 39* 38* 37*  GLUCOSE 155* 104* 149*  BUN 34* 35* 34*  CREATININE 1.13 0.99 1.06  CALCIUM 9.0 9.5 9.3  GFRNONAA >60 >60 >60  ANIONGAP '7 12 10     '$ Hematology Recent Labs  Lab 06/27/20 0513 06/28/20 0631 06/29/20 0349  WBC 9.8 10.1 10.9*  RBC 4.85 4.86 4.86  HGB 13.1 12.8* 12.7*  HCT 39.4 39.6 39.1  MCV 81.2 81.5 80.5  MCH 27.0 26.3 26.1  MCHC 33.2 32.3 32.5  RDW 21.2* 21.2* 20.7*  PLT 128* 137* 159    Cardiac Enzymes  Recent Labs  Lab 06/17/20 0049 06/17/20 0420  TROPONINIHS 39* 54*      Lipids  Lab Results  Component Value Date   CHOL 118  01/20/2020   HDL 34 (L) 01/20/2020   LDLCALC 77 01/20/2020   TRIG 37 01/20/2020   CHOLHDL 3.5 01/20/2020    HbA1c  Lab Results  Component Value Date   HGBA1C 6.1 (H) 06/17/2020    Radiology    DG Chest Port 1 View  Result Date: 06/25/2020 CLINICAL DATA:  Shortness of breath. EXAM: PORTABLE CHEST 1 VIEW COMPARISON:  June 18, 2020. FINDINGS: Stable cardiomegaly with central pulmonary vascular congestion. No pneumothorax is noted. Left internal jugular catheter is noted with distal tip in expected position of left brachiocephalic vein. Mild bibasilar atelectasis or edema is noted with minimal pleural effusions. Bony thorax is unremarkable. IMPRESSION: Stable cardiomegaly with central pulmonary vascular congestion. Mild bibasilar atelectasis or edema is noted with minimal pleural effusions. Electronically Signed   By: Marijo Conception M.D.   On: 06/25/2020 13:07    Telemetry    Afib - Personally Reviewed  Cardiac Studies   Echo 06/2020 1. Left ventricular ejection fraction, by estimation, is <20%. Left  ventricular ejection fraction by PLAX is 14 %. The left ventricle has  severely decreased function. The left ventricle demonstrates global  hypokinesis. The left ventricular internal  cavity size was severely dilated. Left ventricular diastolic parameters  are indeterminate.  2. Right ventricular systolic function is severely reduced. The right  ventricular size is moderately enlarged. There is severely elevated  pulmonary artery systolic pressure. The estimated right ventricular  systolic pressure is [id number] mmHg.  3. Left atrial size was severely dilated.  4. Right atrial size was severely dilated.  5. Tricuspid valve regurgitation is mild to moderate.  6. The inferior vena cava is dilated in size with <50% respiratory  variability, suggesting right atrial pressure of 15 mmHg.   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  Assessment & Plan    1. Acute on chronic biventricular congestive heart failure/pulmonary arterial hypertension: EF 14% by echo earlier this admission. He has been off of milrinone since the 17th and has continued to diurese well with stable renal function. -1.6 L overnight and -32 L since admission. I do not think his weight is accurate as he is listed as down 10.1 kg since yesterday. That said, he does continue to look significantly better with only trace flank  edema. I have asked for a ReDS Vest reading. Given stability of renal function, will continue intravenous diuresis for the time being. He otherwise remains on low-dose spironolactone. Attempts at improving guideline directed medical therapy has been thwarted by relatively low blood pressures, which persist.  2. Coronary artery disease/demand ischemia: No chest pain. High-sensitivity troponin of 54 earlier this admission in the setting of above. Known severe CAD. Continue medical therapy with statin/Zetia. No aspirin in the setting of chronic Eliquis.  3. Permanent atrial fibrillation: Rates stable in the 70s to 80s on digoxin therapy. Home dose of beta-blocker remains on hold in the setting of soft blood pressures.  4. Acute kidney injury: Renal function stable with a creatinine of 1.06 this morning.  5. Essential hypertension/relative hypotension: No longer requiring midodrine but unable to  add back beta-blocker. Continue to follow.  6. Hyperlipidemia: LDL 77 on statin and Zetia therapy. Will consider PCSK9 inhibitor as outpatient.  7. Type 2 diabetes mellitus: Previously on empagliflozin. With relative hypotension, will hold off from resuming at this time.  Signed, Murray Hodgkins, NP  06/29/2020, 9:19 AM    For questions or updates, please contact   Please consult [email or web address] for contact info under Cardiology/STEMI.

## 2020-06-30 ENCOUNTER — Ambulatory Visit (INDEPENDENT_AMBULATORY_CARE_PROVIDER_SITE_OTHER): Payer: Medicare Other | Admitting: Nurse Practitioner

## 2020-06-30 ENCOUNTER — Ambulatory Visit: Payer: Medicare Other

## 2020-06-30 ENCOUNTER — Encounter (INDEPENDENT_AMBULATORY_CARE_PROVIDER_SITE_OTHER): Payer: Medicare Other

## 2020-06-30 ENCOUNTER — Ambulatory Visit: Payer: Medicare Other | Admitting: Family

## 2020-06-30 LAB — CBC WITH DIFFERENTIAL/PLATELET
Abs Immature Granulocytes: 0.07 10*3/uL (ref 0.00–0.07)
Basophils Absolute: 0 10*3/uL (ref 0.0–0.1)
Basophils Relative: 0 %
Eosinophils Absolute: 0.1 10*3/uL (ref 0.0–0.5)
Eosinophils Relative: 1 %
HCT: 39.3 % (ref 39.0–52.0)
Hemoglobin: 13.5 g/dL (ref 13.0–17.0)
Immature Granulocytes: 1 %
Lymphocytes Relative: 9 %
Lymphs Abs: 1 10*3/uL (ref 0.7–4.0)
MCH: 27 pg (ref 26.0–34.0)
MCHC: 34.4 g/dL (ref 30.0–36.0)
MCV: 78.6 fL — ABNORMAL LOW (ref 80.0–100.0)
Monocytes Absolute: 1 10*3/uL (ref 0.1–1.0)
Monocytes Relative: 8 %
Neutro Abs: 9.8 10*3/uL — ABNORMAL HIGH (ref 1.7–7.7)
Neutrophils Relative %: 81 %
Platelets: 158 10*3/uL (ref 150–400)
RBC: 5 MIL/uL (ref 4.22–5.81)
RDW: 20.9 % — ABNORMAL HIGH (ref 11.5–15.5)
WBC: 11.9 10*3/uL — ABNORMAL HIGH (ref 4.0–10.5)
nRBC: 0 % (ref 0.0–0.2)

## 2020-06-30 LAB — GLUCOSE, CAPILLARY
Glucose-Capillary: 111 mg/dL — ABNORMAL HIGH (ref 70–99)
Glucose-Capillary: 117 mg/dL — ABNORMAL HIGH (ref 70–99)
Glucose-Capillary: 119 mg/dL — ABNORMAL HIGH (ref 70–99)
Glucose-Capillary: 148 mg/dL — ABNORMAL HIGH (ref 70–99)
Glucose-Capillary: 236 mg/dL — ABNORMAL HIGH (ref 70–99)
Glucose-Capillary: 97 mg/dL (ref 70–99)

## 2020-06-30 LAB — BASIC METABOLIC PANEL
Anion gap: 10 (ref 5–15)
BUN: 37 mg/dL — ABNORMAL HIGH (ref 6–20)
CO2: 33 mmol/L — ABNORMAL HIGH (ref 22–32)
Calcium: 9.2 mg/dL (ref 8.9–10.3)
Chloride: 93 mmol/L — ABNORMAL LOW (ref 98–111)
Creatinine, Ser: 1.11 mg/dL (ref 0.61–1.24)
GFR, Estimated: >60 mL/min (ref >60)
Glucose, Bld: 119 mg/dL — ABNORMAL HIGH (ref 70–99)
Potassium: 3.7 mmol/L (ref 3.5–5.1)
Sodium: 136 mmol/L (ref 135–145)

## 2020-06-30 LAB — PHOSPHORUS: Phosphorus: 4.3 mg/dL (ref 2.5–4.6)

## 2020-06-30 LAB — MAGNESIUM: Magnesium: 2 mg/dL (ref 1.7–2.4)

## 2020-06-30 MED ORDER — CARVEDILOL 3.125 MG PO TABS
3.1250 mg | ORAL_TABLET | Freq: Two times a day (BID) | ORAL | Status: DC
  Administered 2020-06-30 – 2020-07-01 (×2): 3.125 mg via ORAL
  Filled 2020-06-30 (×2): qty 1

## 2020-06-30 MED ORDER — TORSEMIDE 20 MG PO TABS
40.0000 mg | ORAL_TABLET | Freq: Two times a day (BID) | ORAL | Status: DC
  Administered 2020-06-30 – 2020-07-01 (×2): 40 mg via ORAL
  Filled 2020-06-30 (×2): qty 2

## 2020-06-30 NOTE — Progress Notes (Addendum)
Daily Progress Note   Patient Name: [name] [name]       Date: 06/30/2020 DOB: Jan 05, 1961  Age: 60 y.o. MRN#: [id number] Attending Physician: Shawna Clamp, MD Primary Care Physician: [name], MD Admit Date: 06/17/2020  Reason for Consultation/Follow-up: Establishing goals of care  Subjective: Patient is resting in bed. He states he is eager to go home. Discussed quality of life and quantity of life. He states he understands the modifications he will need to make to try to prolong his time and states "if I can get more months, I want to try." He is amenable to palliative following outpatient to continue conversations at home. He states he has been told previously he is a candidate for hospice at home if he chooses.  Length of Stay: 13  Current Medications: Scheduled Meds:  . apixaban  5 mg Oral BID  . atorvastatin  80 mg Oral Daily  . digoxin  0.125 mg Oral Daily  . DULoxetine  30 mg Oral Daily  . empagliflozin  10 mg Oral Daily  . ezetimibe  10 mg Oral Daily  . furosemide  60 mg Intravenous BID  . gabapentin  400 mg Oral QID  . insulin aspart  0-20 Units Subcutaneous Q4H  . mometasone-formoterol  2 puff Inhalation BID  . multivitamin with minerals  1 tablet Oral Daily  . pantoprazole  40 mg Oral Daily  . sodium chloride flush  3 mL Intravenous Q12H  . spironolactone  12.5 mg Oral Daily  . tamsulosin  0.4 mg Oral QPC supper  . traZODone  150 mg Oral QHS  . umeclidinium bromide  1 puff Inhalation Daily    Continuous Infusions: . sodium chloride Stopped (06/24/20 1400)    PRN Meds: sodium chloride, acetaminophen, Ipratropium-Albuterol, ipratropium-albuterol, nitroGLYCERIN, ondansetron (ZOFRAN) IV, oxyCODONE, sodium chloride flush  Physical Exam Pulmonary:     Effort:  Pulmonary effort is normal.  Neurological:     Mental Status: He is alert.             Vital Signs: BP 113/66 (BP Location: Right Arm)   Pulse 91   Temp 98.5 F (36.9 C) (Oral)   Resp 16   Ht '5\' 7"'$  (1.702 m)   Wt 79.8 kg   SpO2 95%   BMI 27.55 kg/m  SpO2: SpO2: 95 %  O2 Device: O2 Device: Room Air O2 Flow Rate: O2 Flow Rate (L/min): 2 L/min  Intake/output summary:   Intake/Output Summary (Last 24 hours) at 06/30/2020 1208 Last data filed at 06/30/2020 0900 Gross per 24 hour  Intake 1680 ml  Output 2950 ml  Net -1270 ml   LBM: Last BM Date: 06/28/20 Baseline Weight: Weight: 94.8 kg Most recent weight: Weight: 79.8 kg       Flowsheet Rows   Flowsheet Row Most Recent Value  Intake Tab   Referral Department Critical care  Unit at Time of Referral ER  Palliative Care Primary Diagnosis Cardiac  Date Notified 06/18/20  Palliative Care Type New Palliative care  Reason for referral Clarify Goals of Care  Date of Admission 06/17/20  Date first seen by Palliative Care 06/19/20  # of days Palliative referral response time 1 Day(s)  # of days IP prior to Palliative referral 1  Clinical Assessment   Psychosocial & Spiritual Assessment   Palliative Care Outcomes       Patient Active Problem List   Diagnosis Date Noted  . Acute on chronic heart failure ([place])   . CHF (congestive heart failure) ([place]) 06/18/2020  . CHF exacerbation ([place]) 06/17/2020  . Chronic anticoagulation 06/17/2020  . Chronic venous insufficiency 02/10/2020  . HFrEF (heart failure with reduced ejection fraction) ([place]) 01/30/2020  . COPD with acute exacerbation ([place]) 01/30/2020  . Persistent atrial fibrillation ([place]) 01/19/2020  . Chronic bilateral low back pain with sciatica 10/08/2019  . Medication management contract signed 10/08/2019  . Cellulitis of left lower leg 09/27/2019  . Cellulitis of foot, left 09/27/2019  . Chronic hyponatremia 09/27/2019  . Cellulitis of left leg 08/31/2019  . CAD  (coronary artery disease) 08/31/2019  . Acute on chronic systolic CHF (congestive heart failure) ([place]) 11/05/2018  . Open toe wound 10/01/2018  . Venous ulcer of left leg ([place]) 08/19/2018  . 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[place]) 04/28/2018  . Sepsis ([place]) 03/30/2018  . Abnormal MRI, shoulder (Right) 02/23/2018  . Abnormal MRI, cervical spine (2016) 02/23/2018  . DDD (degenerative disc disease), cervical 02/23/2018  . Cervical foraminal stenosis (Bilateral) 02/23/2018  . Cervical central spinal stenosis 02/23/2018  . Cervical facet hypertrophy 02/23/2018  . Arthralgia of acromioclavicular joint (Right) 02/23/2018  . Osteoarthritis of  AC (acromioclavicular) joint (Right) 02/23/2018  . Biceps tendinosis of shoulder (Right) 02/23/2018  . Tendinopathy of rotator cuff (Right) 02/23/2018  . Vitamin D deficiency 02/23/2018  . Atherosclerosis of native arteries of the extremities with ulceration ([place]) 02/15/2018  . Peripheral edema 02/05/2018  . Status post peripherally inserted central catheter (PICC) central line placement 02/04/2018  . Chronic lower extremity pain (Primary Area of Pain) (Right) 01/29/2018  . Chronic shoulder pain [facility] Area of Pain) (Right) 01/29/2018  . Chronic foot pain (Secondary Area of Pain) (Right) 01/29/2018  . Chronic pain syndrome 01/29/2018  . Long term current use of opiate analgesic 01/29/2018  . Medication monitoring encounter 01/29/2018  . Disorder of skeletal system 01/29/2018  . Problems influencing health status 01/29/2018  . AKI (acute kidney injury) ([place]) 01/03/2018  . NSTEMI (non-ST elevated myocardial infarction) ([place]) 11/21/2017  . Coronary artery disease of native artery of native heart with stable angina pectoris ([place]) 09/10/2017  . HLD (hyperlipidemia) 09/10/2017  . Angina pectoris ([place]) 09/08/2017  . Angina at rest [facility]) 08/13/2017  . Lymphedema 05/02/2017  . Chronic systolic CHF (congestive heart failure) ([place]) 04/23/2017   . Chest pain 07/11/2016  . Unstable angina (  [place]) 07/11/2016  . Foot ulcer (Right)   . Stable angina ([facility])   . Pressure injury of skin 05/06/2016  . Diabetic foot infection ([place]) 05/05/2016  . Type 2 diabetes mellitus with other specified complication ([place]) [id number]  . Cellulitis 11/21/2015  . Morbid obesity ([place]) 11/17/2014  . PAD (peripheral artery disease) ([place])   . Hypertension   . Tobacco use   . COPD (chronic obstructive pulmonary disease) ([place]) 05/19/2012  . Diabetic neuropathy ([place]) 05/19/2012  . Peripheral vascular disease ([place]) 03/27/2012    Palliative Care Assessment & Plan    Recommendations/Plan:  Recommend outpatient palliative and transition to hospice if/when patient is ready.     Code Status:    Code Status Orders  (From admission, onward)         Start     Ordered   06/19/20 1029  Do not attempt resuscitation (DNR)  Continuous       Question Answer Comment  In the event of cardiac or respiratory ARREST Do not call a "code blue"   In the event of cardiac or respiratory ARREST Do not perform Intubation, CPR, defibrillation or ACLS   In the event of cardiac or respiratory ARREST Use medication by any route, position, wound care, and other measures to relive pain and suffering. May use oxygen, suction and manual treatment of airway obstruction as needed for comfort.      06/19/20 1028        Code Status History    Date Active Date Inactive Code Status Order ID Comments User Context   06/17/2020 0509 06/19/2020 1028 Full Code [id number]  Athena Masse, MD ED   01/19/2020 1139 01/21/2020 0030 Full Code [id number]  Ivor Costa, MD ED   09/27/2019 0427 09/30/2019 1952 Full Code [id number]  Athena Masse, MD ED   08/31/2019 0109 08/31/2019 0742 Full Code [id number]  Athena Masse, MD ED   08/02/2019 1147 08/02/2019 1835 Full Code [id number]  Algernon Huxley, MD Inpatient   06/14/2019 1103 06/14/2019 1851 Full Code [id number]  Algernon Huxley, MD Inpatient   11/06/2018 0023  11/09/2018 1816 Full Code [id number]  Lance Coon, MD ED   08/24/2018 0939 08/24/2018 1444 Full Code [id number]  Algernon Huxley, MD Inpatient   07/27/2018 2248 07/28/2018 1836 Full Code [id number]  Vaughan Basta, MD Inpatient   03/30/2018 1839 04/07/2018 2116 Full Code [id number]  Nicholes Mango, MD Inpatient   03/03/2018 1044 03/03/2018 1545 Full Code [id number]  Schnier, Dolores Lory, MD Inpatient   01/03/2018 0409 01/09/2018 1723 Full Code [id number]  Lance Coon, MD Inpatient   12/22/2017 0607 12/23/2017 1916 Full Code [id number]  Harrie Foreman, MD Inpatient   11/21/2017 0415 11/23/2017 1444 Full Code [id number]  Lance Coon, MD Inpatient   04/23/2017 0355 04/29/2017 1353 Full Code [id number]  Saundra Shelling, MD Inpatient   07/11/2016 0324 07/11/2016 1851 Full Code [id number]  Saundra Shelling, MD Inpatient   05/06/2016 0005 05/07/2016 1417 Full Code [id number]  Hugelmeyer, [place], DO Inpatient   11/21/2015 1716 11/22/2015 1950 Full Code [id number]  Gladstone Lighter, MD Inpatient   07/06/2015 0948 07/06/2015 1437 Full Code [id number]  Algernon Huxley, MD Inpatient   Advance Care Planning Activity    Advance Directive Documentation   Flowsheet Row Most Recent Value  Type of Advance Directive Healthcare Power of Attorney, Living will  Pre-existing out of facility DNR order (yellow form or pink MOST form) --  "MOST" Form in Place? --  Prognosis:   < 6 months  Discharge Planning:  Home with Palliative Services   Thank you for allowing the Palliative Medicine Team to assist in the care of this patient.   Total Time 15 min Prolonged Time Billed no      Greater than 50%  of this time was spent counseling and coordinating care related to the above assessment and plan.  Asencion Gowda, NP  Please contact Palliative Medicine Team phone at [phone] for questions and concerns.

## 2020-06-30 NOTE — TOC Initial Note (Signed)
Transition of Care [facility]) - Initial/Assessment Note    Patient Details  Name: [name] [name] MRN: [id number] Date of Birth: 01-20-1961  Transition of Care [facility]) CM/SW Contact:    Eileen Stanford, LCSW Phone Number: 06/30/2020, 1:54 PM  Clinical Narrative:     Pt is refusing SNF stating he is going back home with his girlfriend. Pt is agreeable to [facility]. Pt does not have agency preference. Pt states his girlfriend or he takes himself to doctor appointments. Pt states he uses [facility] in [place] for prescriptions. Pt states he still sees Dr.Harper. Pt denies having any services at home prior to admission. CSW will reach out to [facility] agencies to see who can service pt.              Expected Discharge Plan: [place] Barriers to Discharge: Continued Medical Work up   Patient Goals and CMS Choice Patient states their goals for this hospitalization and ongoing recovery are:: to get better CMS Medicare.gov Compare Post Acute Care list provided to:: Patient Choice offered to / list presented to : Patient  Expected Discharge Plan and Services Expected Discharge Plan: [place] In-house Referral: NA   Post Acute Care Choice: [place] arrangements for the past 2 months: Single Family Home                           HH Arranged: PT,OT          Prior Living Arrangements/Services Living arrangements for the past 2 months: Single Family Home Lives with:: Significant Other Patient language and need for interpreter reviewed:: Yes Do you feel safe going back to the place where you live?: Yes      Need for Family Participation in Patient Care: Yes (Comment) Care giver support system in place?: Yes (comment)   Criminal Activity/Legal Involvement Pertinent to Current Situation/Hospitalization: No - Comment as needed  Activities of Daily Living Home Assistive Devices/Equipment: Wheelchair,Walker (specify type),Crutches ADL Screening (condition at  time of admission) Patient's cognitive ability adequate to safely complete daily activities?: Yes Is the patient deaf or have difficulty hearing?: No Does the patient have difficulty seeing, even when wearing glasses/contacts?: No Does the patient have difficulty concentrating, remembering, or making decisions?: Yes Patient able to express need for assistance with ADLs?: Yes Does the patient have difficulty dressing or bathing?: No Independently performs ADLs?: Yes (appropriate for developmental age) Does the patient have difficulty walking or climbing stairs?: No Weakness of Legs: Left Weakness of Arms/Hands: Right  Permission Sought/Granted Permission sought to share information with : Family Supports Permission granted to share information with : Yes, Verbal Permission Granted  Share Information with NAME: elizabeth     Permission granted to share info w Relationship: significant other     Emotional Assessment Appearance:: Appears stated age Attitude/Demeanor/Rapport: Engaged Affect (typically observed): Accepting,Appropriate Orientation: : Oriented to Situation,Oriented to Place,Oriented to Self,Oriented to  Time Alcohol / Substance Use: Not Applicable Psych Involvement: No (comment)  Admission diagnosis:  Bilirubinemia [E80.6] CHF (congestive heart failure) (HCC) [I50.9] Hyponatremia [E87.1] Tobacco abuse [Z72.0] Acute pulmonary edema (HCC) [J81.0] SOB (shortness of breath) [R06.02] CHF exacerbation ([place]) [I50.9] Troponin I above reference range [R77.8] AKI (acute kidney injury) ([place]) [N17.9] Chest pain [R07.9] Acute on chronic heart failure, unspecified heart failure type [facility]) [I50.9] Patient Active Problem List   Diagnosis Date Noted  . Acute on chronic heart failure ([place])   .  CHF (congestive heart failure) ([place]) 06/18/2020  . CHF exacerbation ([place]) 06/17/2020  . Chronic anticoagulation 06/17/2020  . Chronic venous insufficiency 02/10/2020  . HFrEF (heart failure  with reduced ejection fraction) ([place]) 01/30/2020  . COPD with acute exacerbation ([place]) 01/30/2020  . Persistent atrial fibrillation ([place]) 01/19/2020  . Chronic bilateral low back pain with sciatica 10/08/2019  . Medication management contract signed 10/08/2019  . Cellulitis of left lower leg 09/27/2019  . Cellulitis of foot, left 09/27/2019  . Chronic hyponatremia 09/27/2019  . Cellulitis of left leg 08/31/2019  . CAD (coronary artery disease) 08/31/2019  . Acute on chronic systolic CHF (congestive heart failure) ([place]) 11/05/2018  . Open toe wound 10/01/2018  . Venous ulcer of left leg ([place]) 08/19/2018  . 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[place]) 04/28/2018  . Sepsis ([place]) 03/30/2018  . Abnormal MRI, shoulder (Right) 02/23/2018  . Abnormal MRI, cervical spine (2016) 02/23/2018  . DDD (degenerative disc disease), cervical 02/23/2018  . Cervical foraminal stenosis (Bilateral) 02/23/2018  . Cervical central spinal stenosis 02/23/2018  . Cervical facet hypertrophy 02/23/2018  . Arthralgia of acromioclavicular joint (Right) 02/23/2018  . Osteoarthritis of  AC (acromioclavicular) joint (Right) 02/23/2018  . Biceps tendinosis of shoulder (Right) 02/23/2018  . Tendinopathy of rotator cuff (Right) 02/23/2018  . Vitamin D deficiency 02/23/2018  . Atherosclerosis of native arteries of the extremities with ulceration ([place]) 02/15/2018  . Peripheral edema 02/05/2018  . Status post peripherally inserted central catheter (PICC) central line placement 02/04/2018  . Chronic lower extremity pain (Primary Area of Pain) (Right) 01/29/2018  . Chronic shoulder pain [facility] Area of Pain) (Right) 01/29/2018  . Chronic foot pain (Secondary Area of Pain) (Right) 01/29/2018  . Chronic pain syndrome 01/29/2018  . Long term current use of opiate analgesic 01/29/2018  . Medication monitoring encounter 01/29/2018  . Disorder of skeletal system 01/29/2018  . Problems influencing health status  01/29/2018  . AKI (acute kidney injury) ([place]) 01/03/2018  . NSTEMI (non-ST elevated myocardial infarction) ([place]) 11/21/2017  . Coronary artery disease of native artery of native heart with stable angina pectoris ([place]) 09/10/2017  . HLD (hyperlipidemia) 09/10/2017  . Angina pectoris ([place]) 09/08/2017  . Angina at rest [facility]) 08/13/2017  . Lymphedema 05/02/2017  . Chronic systolic CHF (congestive heart failure) ([place]) 04/23/2017  . Chest pain 07/11/2016  . Unstable angina ([place]) 07/11/2016  . Foot ulcer (Right)   . Stable angina (HCC)   . Pressure injury of skin 05/06/2016  . Diabetic foot infection ([place]) 05/05/2016  . Type 2 diabetes mellitus with other specified complication ([place]) [id number]  . Cellulitis 11/21/2015  . Morbid obesity ([place]) 11/17/2014  . PAD (peripheral artery disease) ([place])   . Hypertension   . Tobacco use   . COPD (chronic obstructive pulmonary disease) ([place]) 05/19/2012  . Diabetic neuropathy ([place]) 05/19/2012  . Peripheral vascular disease ([place]) 03/27/2012   PCP:  [name], MD Pharmacy:   [place], [place] [address] Phone: [phone] Fax: [place] [facility] [address] [place] [id number] Phone: [phone] Fax: [phone]     Social Determinants of Health (SDOH) Interventions    Readmission Risk Interventions Readmission Risk Prevention Plan 06/30/2020 11/09/2018 11/08/2018  Transportation Screening Complete - Complete  HRI or Home Care Consult - - Complete  Social Work Consult for [place] Planning/Counseling - Not Complete -  SW consult not completed comments - na -  Palliative Care Screening - Not Applicable -  Medication Review (RN Care Manager) Complete - Complete  PCP or Specialist appointment within 3-5 days of discharge Complete - -  HRI or Home Care Consult Complete - -  SW Recovery Care/Counseling Consult Complete - -  Palliative  Care Screening Not Applicable - -  [place] Not Applicable - -  Some recent data might be hidden

## 2020-06-30 NOTE — Care Management Important Message (Signed)
Important Message  Patient Details  Name: [name] [name] MRN: [id number] Date of Birth: 1960-07-16   Medicare Important Message Given:  Yes     [name] 06/30/2020, 12:59 PM

## 2020-06-30 NOTE — Progress Notes (Signed)
Progress Note  Patient Name: [name] [name] Date of Encounter: 06/30/2020  [facility] Cardiologist: Ida Rogue, MD   Subjective   UOP -4.1L overnight, net -35.6L.[name] Patient on RA. No acute events overnight.   Inpatient Medications    Scheduled Meds: . apixaban  5 mg Oral BID  . atorvastatin  80 mg Oral Daily  . digoxin  0.125 mg Oral Daily  . DULoxetine  30 mg Oral Daily  . empagliflozin  10 mg Oral Daily  . ezetimibe  10 mg Oral Daily  . furosemide  60 mg Intravenous BID  . gabapentin  400 mg Oral QID  . insulin aspart  0-20 Units Subcutaneous Q4H  . mometasone-formoterol  2 puff Inhalation BID  . multivitamin with minerals  1 tablet Oral Daily  . pantoprazole  40 mg Oral Daily  . sodium chloride flush  3 mL Intravenous Q12H  . spironolactone  12.5 mg Oral Daily  . tamsulosin  0.4 mg Oral QPC supper  . traZODone  150 mg Oral QHS  . umeclidinium bromide  1 puff Inhalation Daily   Continuous Infusions: . sodium chloride Stopped (06/24/20 1400)   PRN Meds: sodium chloride, acetaminophen, Ipratropium-Albuterol, ipratropium-albuterol, nitroGLYCERIN, ondansetron (ZOFRAN) IV, oxyCODONE, sodium chloride flush   Vital Signs    Vitals:   06/29/20 2125 06/30/20 0448 06/30/20 0600 06/30/20 0805  BP: 92/68 (!) 84/65 90/68 113/66  Pulse: 73 73  91  Resp: '18 18  16  '$ Temp: 98.3 F (36.8 C) 98.1 F (36.7 C)  98.5 F (36.9 C)  TempSrc: Oral   Oral  SpO2: 99% 99%  95%  Weight:  79.8 kg    Height:        Intake/Output Summary (Last 24 hours) at 06/30/2020 1225 Last data filed at 06/30/2020 0900 Gross per 24 hour  Intake 1680 ml  Output 2950 ml  Net -1270 ml   Last 3 Weights 06/30/2020 06/29/2020 06/28/2020  Weight (lbs) 175 lb 14.4 oz 184 lb 206 lb 5.6 oz  Weight (kg) 79.788 kg 83.462 kg 93.6 kg  Some encounter information is confidential and restricted. Go to Review Flowsheets activity to see all data.      Telemetry    Afib, PVCs, brief NSVT, HR 80s -  Personally Reviewed  ECG    No new - Personally Reviewed  Physical Exam   GEN: No acute distress.   Neck: No JVD Cardiac: RRR, no murmurs, rubs, or gallops.  Respiratory: diminished at bases GI: Soft, nontender, non-distended  MS: minmal edema; R BKA Neuro:  Nonfocal  Psych: Normal affect   Labs    High Sensitivity Troponin:   Recent Labs  Lab 06/17/20 0049 06/17/20 0420  TROPONINIHS 39* 54*      Chemistry Recent Labs  Lab 06/28/20 0939 06/29/20 0349 06/30/20 0437  NA 136 136 136  K 4.0 3.7 3.7  CL 89* 89* 93*  CO2 37* 35* 33*  GLUCOSE 149* 118* 119*  BUN 34* 36* 37*  CREATININE 1.06 1.10 1.11  CALCIUM 9.3 9.1 9.2  GFRNONAA >60 >60 >60  ANIONGAP '10 12 10     '$ Hematology Recent Labs  Lab 06/28/20 0631 06/29/20 0349 06/30/20 0437  WBC 10.1 10.9* 11.9*  RBC 4.86 4.86 5.00  HGB 12.8* 12.7* 13.5  HCT 39.6 39.1 39.3  MCV 81.5 80.5 78.6*  MCH 26.3 26.1 27.0  MCHC 32.3 32.5 34.4  RDW 21.2* 20.7* 20.9*  PLT 137* 159 158    BNPNo results for input(s):  BNP, PROBNP in the last 168 hours.   DDimer No results for input(s): DDIMER in the last 168 hours.   Radiology    No results found.  Cardiac Studies   Echo 06/2020 1. Left ventricular ejection fraction, by estimation, is <20%. Left  ventricular ejection fraction by PLAX is 14 %. The left ventricle has  severely decreased function. The left ventricle demonstrates global  hypokinesis. The left ventricular internal  cavity size was severely dilated. Left ventricular diastolic parameters  are indeterminate.  2. Right ventricular systolic function is severely reduced. The right  ventricular size is moderately enlarged. There is severely elevated  pulmonary artery systolic pressure. The estimated right ventricular  systolic pressure is [id number] mmHg.  3. Left atrial size was severely dilated.  4. Right atrial size was severely dilated.  5. Tricuspid valve regurgitation is mild to moderate.  6. The  inferior vena cava is dilated in size with <50% respiratory  variability, suggesting right atrial pressure of 15 mmHg.   Patient Profile     60 y.o. male with history of CAD/PCI, HFrEF EF less than 20%, persistent A. fib, PAD, right BKA, OSA, COPD presenting with shortness of breath. Being seen for HFrEF and volume overload.   Assessment & Plan    Acute on chronic BiV CHF Pulmonary HTN - EF <20% on echo this admission - Milrinone discontinued 1/17 - He has been diuresing well - weaned of midodrine - UOP -4.1L overnight. Weight down 209>175lbs - still has some mild lower leg edema on exam, continue with diuresis - creatinine stable  CAD/demand ischemia - No chest pain - HS torp peak 54 - Known severe CAD however has refused CABG in the past. Also hx of leaving AMA - continue statin/zetia - No ASA with eliquis  Permanent Afib - rates stable 70-80s - continue digoxin - Home BB held due to hypotension  AKI - improving, 1.11 today  HTN - no longer on midodrine  HLD - LDL 77 on zetia and statin - consider PCSK9i as OP  DM2 - PTA empagliflozin, held with hypotension   For questions or updates, please contact [facility] Please consult [email or web address] for contact info under        Signed, Iven Earnhart Ninfa Meeker, PA-C  06/30/2020, 12:25 PM

## 2020-06-30 NOTE — Progress Notes (Signed)
PROGRESS NOTE    [name] [name]  [id number] DOB: [birth year] DOA: 06/17/2020 PCP: [name], MD    Brief Narrative: This 60 years old male with PMH of ICM with EF of 20 to 25%, CAD status post PCI, tobacco abuse, COPD, PAD status post left SFA stenting,  status post right BKA, A. fib on Eliquis, OSA noncompliant with CPAP, history of leaving AMA and noncompliance who presented in the ER for the evaluation of worsening shortness of breath.  Patient is admitted for acute on chronic hypoxic respiratory failure secondary to CHF exacerbation.  Patient was admitted in the ICU for milrinone and Lasix infusion.  Milrinone has been discontinued.  patient is stabilized from ICU standpoint.  PCCM pickup.   Assessment & Plan:   Principal Problem:   Acute on chronic systolic CHF (congestive heart failure) (HCC) Active Problems:   Coronary artery disease of native artery of native heart with stable angina pectoris ([place])   Hypertension   Type 2 diabetes mellitus with other specified complication (HCC)   AKI (acute kidney injury) ([place])   COPD (chronic obstructive pulmonary disease) ([place])   Long term current use of opiate analgesic   Chronic hyponatremia   Persistent atrial fibrillation (HCC)   CHF exacerbation (HCC)   Chronic anticoagulation   CHF (congestive heart failure) (HCC)   Acute on chronic heart failure (HCC)  Acute on chronic systolic CHF: Biventricular heart failure: Left ventricular EF less than 20%. Milrinone was discontinued on 1/17. Urine output -4.1 L overnight. Patient still has some leg edema. Continue digoxin, Jardiance, spironolactone, Lasix. Patient condition has improved, approaching euvolemic status. Cardiology following,  continue Lasix 60 mg IV twice a day.  Acute kidney injury:  Renal functions improving ,  Avoid nephrotoxic medications.  Persistent A. Fib: Continue Eliquis 5 mg twice daily. Hx. of noncompliance with Eliquis.  Hyperlipidemia : continue  atorvastatin 80 mg daily  Diabetes mellitus: Medication noncompliance. Hemoglobin A1c is elevated Regular insulin sliding scale.  COPD is stable.  not in decompensation  DVT prophylaxis:  Lovenox Code Status:  Full code. Family Communication:   No family at bed side. Disposition Plan:   Status is: Inpatient  Remains inpatient appropriate because:Inpatient level of care appropriate due to severity of illness   Dispo: The patient is from: Home              Anticipated d/c is to: SNF              Anticipated d/c date is: 1 day              Patient currently is not medically stable to d/c.   Difficult to place patient No      Consultants:   Cardiology  Procedures:  Antimicrobials:   Anti-infectives (From admission, onward)   None     Subjective: Patient was seen and examined at bedside.  Overnight events noted.  Patient reports feeling much better.  Patient wants to be discharged home instead of nursing home.  Objective: Vitals:   06/30/20 0448 06/30/20 0600 06/30/20 0805 06/30/20 1235  BP: (!) 84/65 90/68 113/66 113/90  Pulse: 73  91 92  Resp: '18  16 18  '$ Temp: 98.1 F (36.7 C)  98.5 F (36.9 C) 97.6 F (36.4 C)  TempSrc:   Oral Oral  SpO2: 99%  95% 97%  Weight: 79.8 kg     Height:        Intake/Output Summary (Last 24 hours) at 06/30/2020 1506 Last  data filed at 06/30/2020 1407 Gross per 24 hour  Intake 1800 ml  Output 3850 ml  Net -2050 ml   Filed Weights   06/28/20 0500 06/29/20 0219 06/30/20 0448  Weight: 93.6 kg 83.5 kg 79.8 kg    Examination:  General exam: Appears calm and comfortable, not in any acute distress Respiratory system: Clear to auscultation. Respiratory effort normal. Cardiovascular system: S1 & S2 heard, RRR. No JVD, murmurs, rubs, gallops or clicks. No pedal edema. Gastrointestinal system: Abdomen is nondistended, soft and nontender. No organomegaly or masses felt. Normal bowel sounds heard. Central nervous system: Alert and  oriented. No focal neurological deficits. Extremities: Symmetric 5 x 5 power.,  Right BKA, left leg wrapped in dressing because of wound Skin: No rashes, lesions or ulcers Psychiatry: Judgement and insight appear normal. Mood & affect appropriate.     Data Reviewed: I have personally reviewed following labs and imaging studies  CBC: Recent Labs  Lab 06/26/20 0514 06/27/20 0513 06/28/20 0631 06/29/20 0349 06/30/20 0437  WBC 8.5 9.8 10.1 10.9* 11.9*  NEUTROABS 7.2 8.1* 8.0* 8.6* 9.8*  HGB 12.4* 13.1 12.8* 12.7* 13.5  HCT 37.1* 39.4 39.6 39.1 39.3  MCV 81.0 81.2 81.5 80.5 78.6*  PLT 115* 128* 137* 159 0000000   Basic Metabolic Panel: Recent Labs  Lab 06/26/20 0514 06/27/20 0513 06/28/20 0631 06/28/20 0939 06/29/20 0349 06/30/20 0437  NA 134* 136  --  136 136 136  K 3.9 4.3  --  4.0 3.7 3.7  CL 88* 86*  --  89* 89* 93*  CO2 39* 38*  --  37* 35* 33*  GLUCOSE 155* 104*  --  149* 118* 119*  BUN 34* 35*  --  34* 36* 37*  CREATININE 1.13 0.99  --  1.06 1.10 1.11  CALCIUM 9.0 9.5  --  9.3 9.1 9.2  MG 2.0 2.2 2.0  --  2.1 2.0  PHOS 3.1 3.7 4.3  --  4.2 4.3   GFR: Estimated Creatinine Clearance: 74.4 mL/min (by C-G formula based on SCr of 1.11 mg/dL). Liver Function Tests: No results for input(s): AST, ALT, ALKPHOS, BILITOT, PROT, ALBUMIN in the last 168 hours. No results for input(s): LIPASE, AMYLASE in the last 168 hours. No results for input(s): AMMONIA in the last 168 hours. Coagulation Profile: No results for input(s): INR, PROTIME in the last 168 hours. Cardiac Enzymes: No results for input(s): CKTOTAL, CKMB, CKMBINDEX, TROPONINI in the last 168 hours. BNP (last 3 results) No results for input(s): PROBNP in the last 8760 hours. HbA1C: No results for input(s): HGBA1C in the last 72 hours. CBG: Recent Labs  Lab 06/29/20 2127 06/30/20 0030 06/30/20 0447 06/30/20 0806 06/30/20 1235  GLUCAP 176* 148* 111* 97 117*   Lipid Profile: No results for input(s): CHOL,  HDL, LDLCALC, TRIG, CHOLHDL, LDLDIRECT in the last 72 hours. Thyroid Function Tests: No results for input(s): TSH, T4TOTAL, FREET4, T3FREE, THYROIDAB in the last 72 hours. Anemia Panel: No results for input(s): VITAMINB12, FOLATE, FERRITIN, TIBC, IRON, RETICCTPCT in the last 72 hours. Sepsis Labs: No results for input(s): PROCALCITON, LATICACIDVEN in the last 168 hours.  No results found for this or any previous visit (from the past 240 hour(s)).   Radiology Studies: No results found.  Scheduled Meds: . apixaban  5 mg Oral BID  . atorvastatin  80 mg Oral Daily  . digoxin  0.125 mg Oral Daily  . DULoxetine  30 mg Oral Daily  . empagliflozin  10 mg Oral Daily  .  ezetimibe  10 mg Oral Daily  . furosemide  60 mg Intravenous BID  . gabapentin  400 mg Oral QID  . insulin aspart  0-20 Units Subcutaneous Q4H  . mometasone-formoterol  2 puff Inhalation BID  . multivitamin with minerals  1 tablet Oral Daily  . pantoprazole  40 mg Oral Daily  . sodium chloride flush  3 mL Intravenous Q12H  . spironolactone  12.5 mg Oral Daily  . tamsulosin  0.4 mg Oral QPC supper  . traZODone  150 mg Oral QHS  . umeclidinium bromide  1 puff Inhalation Daily   Continuous Infusions: . sodium chloride Stopped (06/24/20 1400)     LOS: 13 days    Time spent: 25 mins    Leatta Alewine, MD [facility]   If 7PM-7AM, please contact night-coverage

## 2020-06-30 NOTE — Progress Notes (Signed)
Physical Therapy Treatment Patient Details Name: [name] [name] MRN: [id number] DOB: [birth year] Today's Date: 06/30/2020    History of Present Illness Pt is a 60 y/o M with PMH: Arthritis, Asthma, Atherosclerosis, BPH, Charcot's joint of foot-right, Chronic combined systolic and diastolic heart failure, COPD, CAD/PCI, tobacco abuse, PAD s/p L SFA stenting, s/p R BKA, afib on Eliquis, OSA noncompliant with CPAP, and h/o leaving AMA/medical noncompliance. Pt presented d/t SOB, DOE and fluid retention in LEs. Trop slightly elevated, Cardiology note indicates: "patient declined CABG in the past at [facility]; He had a cath in August showing no great PCI targets but considered atherectomy of Lcx however patient eventually left AMA, No plan for ischemic work-up at this time". Admitted acutely for management of acute/chronic CHF, requiring for milrinone drip.    PT Comments    Pt was long sitting in bed upon arriving. Very pleasant and agreeable to PT session. A and O x 4. Was easily able to exit R side of bed, donn prosthetic and shoes without assistance. Ambulated to doorway and return 2 x prior to endorsing fatigue. Overall pt is progressing well. If he DC home, would benefit from skilled HHPT to continue to improve independence and ADL    Follow Up Recommendations  SNF     Equipment Recommendations  Rolling walker with 5" wheels    Recommendations for Other Services       Precautions / Restrictions Precautions Precautions: Fall Restrictions Weight Bearing Restrictions: No Other Position/Activity Restrictions: monitor HR; hx of R BKA, wears prosthesis; wound on LLE    Mobility  Bed Mobility Overal bed mobility: Needs Assistance Bed Mobility: Supine to Sit;Sit to Supine     Supine to sit: Modified independent (Device/Increase time) Sit to supine: Modified independent (Device/Increase time);Min assist      Transfers Overall transfer level: Needs assistance Equipment used: Rolling  walker (2 wheeled) Transfers: Sit to/from Stand Sit to Stand: Supervision            Ambulation/Gait Ambulation/Gait assistance: Supervision Gait Distance (Feet): 50 Feet Assistive device: Rolling walker (2 wheeled) Gait Pattern/deviations:  (" hop to.") Gait velocity: decreased   General Gait Details: Pt easily able to ambulate to/from door 2 x without LOB however does endorse fatigue       Balance Overall balance assessment: Modified Independent         Cognition Arousal/Alertness: Awake/alert Behavior During Therapy: WFL for tasks assessed/performed Overall Cognitive Status: Within Functional Limits for tasks assessed               Pertinent Vitals/Pain Pain Assessment: No/denies pain Pain Score: 0-No pain           PT Goals (current goals can now be found in the care plan section) Acute Rehab PT Goals Patient Stated Goal: to go home Progress towards PT goals: Progressing toward goals    Frequency    Min 2X/week      PT Plan Current plan remains appropriate       AM-PAC PT "6 Clicks" Mobility   Outcome Measure  Help needed turning from your back to your side while in a flat bed without using bedrails?: A Little Help needed moving from lying on your back to sitting on the side of a flat bed without using bedrails?: A Little Help needed moving to and from a bed to a chair (including a wheelchair)?: A Little Help needed standing up from a chair using your arms (e.g., wheelchair or bedside chair)?: A Little Help  needed to walk in hospital room?: A Little Help needed climbing 3-5 steps with a railing? : A Little 6 Click Score: 18    End of Session Equipment Utilized During Treatment: Gait belt Activity Tolerance: Patient limited by fatigue Patient left: in chair;with call bell/phone within reach;with chair alarm set Nurse Communication: Mobility status PT Visit Diagnosis: Muscle weakness (generalized) (M62.81);Difficulty in walking, not  elsewhere classified (R26.2)     Time: [id number] PT Time Calculation (min) (ACUTE ONLY): 18 min  Charges:  $Therapeutic Activity: 8-22 mins                     Julaine Fusi PTA 06/30/20, 2:06 PM

## 2020-07-01 LAB — CBC WITH DIFFERENTIAL/PLATELET
Abs Immature Granulocytes: 0.06 10*3/uL (ref 0.00–0.07)
Basophils Absolute: 0 10*3/uL (ref 0.0–0.1)
Basophils Relative: 0 %
Eosinophils Absolute: 0.1 10*3/uL (ref 0.0–0.5)
Eosinophils Relative: 1 %
HCT: 42.9 % (ref 39.0–52.0)
Hemoglobin: 14.3 g/dL (ref 13.0–17.0)
Immature Granulocytes: 1 %
Lymphocytes Relative: 9 %
Lymphs Abs: 1.1 10*3/uL (ref 0.7–4.0)
MCH: 26.5 pg (ref 26.0–34.0)
MCHC: 33.3 g/dL (ref 30.0–36.0)
MCV: 79.4 fL — ABNORMAL LOW (ref 80.0–100.0)
Monocytes Absolute: 1.1 10*3/uL — ABNORMAL HIGH (ref 0.1–1.0)
Monocytes Relative: 9 %
Neutro Abs: 9.9 10*3/uL — ABNORMAL HIGH (ref 1.7–7.7)
Neutrophils Relative %: 80 %
Platelets: 187 10*3/uL (ref 150–400)
RBC: 5.4 MIL/uL (ref 4.22–5.81)
RDW: 20.9 % — ABNORMAL HIGH (ref 11.5–15.5)
WBC: 12.3 10*3/uL — ABNORMAL HIGH (ref 4.0–10.5)
nRBC: 0 % (ref 0.0–0.2)

## 2020-07-01 LAB — GLUCOSE, CAPILLARY
Glucose-Capillary: 108 mg/dL — ABNORMAL HIGH (ref 70–99)
Glucose-Capillary: 130 mg/dL — ABNORMAL HIGH (ref 70–99)
Glucose-Capillary: 134 mg/dL — ABNORMAL HIGH (ref 70–99)
Glucose-Capillary: 139 mg/dL — ABNORMAL HIGH (ref 70–99)

## 2020-07-01 LAB — BASIC METABOLIC PANEL
Anion gap: 11 (ref 5–15)
BUN: 42 mg/dL — ABNORMAL HIGH (ref 6–20)
CO2: 34 mmol/L — ABNORMAL HIGH (ref 22–32)
Calcium: 9.4 mg/dL (ref 8.9–10.3)
Chloride: 93 mmol/L — ABNORMAL LOW (ref 98–111)
Creatinine, Ser: 1.33 mg/dL — ABNORMAL HIGH (ref 0.61–1.24)
GFR, Estimated: >60 mL/min (ref >60)
Glucose, Bld: 133 mg/dL — ABNORMAL HIGH (ref 70–99)
Potassium: 4 mmol/L (ref 3.5–5.1)
Sodium: 138 mmol/L (ref 135–145)

## 2020-07-01 LAB — PHOSPHORUS: Phosphorus: 4.1 mg/dL (ref 2.5–4.6)

## 2020-07-01 LAB — MAGNESIUM: Magnesium: 2.1 mg/dL (ref 1.7–2.4)

## 2020-07-01 MED ORDER — TORSEMIDE 20 MG PO TABS: 40.0000 mg | ORAL_TABLET | Freq: Every day | ORAL | 4 refills | Status: DC

## 2020-07-01 MED ORDER — CARVEDILOL 3.125 MG PO TABS: 3.1250 mg | ORAL_TABLET | Freq: Two times a day (BID) | ORAL | 1 refills | Status: DC

## 2020-07-01 MED ORDER — SPIRONOLACTONE 25 MG PO TABS: 12.5000 mg | ORAL_TABLET | Freq: Every day | ORAL | 1 refills | Status: DC

## 2020-07-01 MED ORDER — DIGOXIN 125 MCG PO TABS: 0.1250 mg | ORAL_TABLET | Freq: Every day | ORAL | 0 refills | Status: DC

## 2020-07-01 MED ORDER — TAMSULOSIN HCL 0.4 MG PO CAPS: 0.4000 mg | ORAL_CAPSULE | Freq: Every day | ORAL | 1 refills | Status: AC

## 2020-07-01 NOTE — Progress Notes (Signed)
[name] [name] to be D/C'd Home per MD order.  Discussed prescriptions and follow up appointments with the patient. Prescriptions electronically submitted, medication list explained in detail. Pt and wife verbalized understanding.  Allergies as of 07/01/2020      Reactions   Dulaglutide Anaphylaxis, Diarrhea, Hives   Other Itching   Skin itching associated with nitro patch   Prednisone Rash      Medication List    STOP taking these medications   metFORMIN 500 MG 24 hr tablet Commonly known as: GLUCOPHAGE-XR   metolazone 5 MG tablet Commonly known as: ZAROXOLYN   metoprolol succinate 25 MG 24 hr tablet Commonly known as: TOPROL-XL     TAKE these medications   albuterol 108 (90 Base) MCG/ACT inhaler Commonly known as: VENTOLIN HFA Inhale into the lungs every 6 (six) hours as needed for wheezing or shortness of breath.   atorvastatin 80 MG tablet Commonly known as: LIPITOR Take 80 mg by mouth daily.   carvedilol 3.125 MG tablet Commonly known as: COREG Take 1 tablet (3.125 mg total) by mouth 2 (two) times daily with a meal.   clopidogrel 75 MG tablet Commonly known as: PLAVIX Take 75 mg by mouth daily.   digoxin 0.125 MG tablet Commonly known as: LANOXIN Take 1 tablet (0.125 mg total) by mouth daily. Start taking on: July 02, 2020   DULoxetine 20 MG capsule Commonly known as: CYMBALTA Take 30 mg by mouth daily.   Eliquis 5 MG Tabs tablet Generic drug: apixaban Take 5 mg by mouth 2 (two) times daily.   empagliflozin 10 MG Tabs tablet Commonly known as: JARDIANCE Take 10 mg by mouth daily.   ezetimibe 10 MG tablet Commonly known as: ZETIA Take 10 mg by mouth daily.   gabapentin 800 MG tablet Commonly known as: NEURONTIN Take 800 mg by mouth 4 (four) times daily.   ipratropium-albuterol 0.5-2.5 (3) MG/3ML Soln Commonly known as: DUONEB Inhale 3 mLs into the lungs every 6 (six) hours as needed (wheezing/sob).   Combivent Respimat 20-100 MCG/ACT Aers  respimat Generic drug: Ipratropium-Albuterol Inhale 1 puff into the lungs every 6 (six) hours as needed for wheezing.   isosorbide mononitrate 30 MG 24 hr tablet Commonly known as: IMDUR Take 30 mg by mouth daily.   Multi-Vitamins Tabs Take 1 tablet by mouth daily.   nitroGLYCERIN 0.4 MG SL tablet Commonly known as: NITROSTAT Place 1 tablet (0.4 mg total) under the tongue every 5 (five) minutes as needed for chest pain.   omeprazole 40 MG capsule Commonly known as: PRILOSEC Take 40 mg by mouth daily.   Oxycodone HCl 10 MG Tabs Take 10 mg by mouth every 6 (six) hours as needed (pain).   potassium chloride 10 MEQ tablet Commonly known as: KLOR-CON Take 10 mEq by mouth 2 (two) times daily.   sodium chloride 1 g tablet Take 1 tablet (1 g total) by mouth 3 (three) times daily with meals.   spironolactone 25 MG tablet Commonly known as: ALDACTONE Take 0.5 tablets (12.5 mg total) by mouth daily. Start taking on: July 02, 2020   Symbicort 80-4.5 MCG/ACT inhaler Generic drug: budesonide-formoterol Inhale 2 puffs into the lungs daily.   tamsulosin 0.4 MG Caps capsule Commonly known as: FLOMAX Take 1 capsule (0.4 mg total) by mouth daily after supper.   torsemide 20 MG tablet Commonly known as: DEMADEX Take 2 tablets (40 mg total) by mouth daily. What changed: when to take this   traZODone 150 MG tablet Commonly known  as: DESYREL Take 150 mg by mouth at bedtime.   Vericiguat 5 MG Tabs Take 5 mg by mouth daily.       Vitals:   07/01/20 0802 07/01/20 1250  BP: 114/71 100/65  Pulse: 75 60  Resp: 18 18  Temp: 98.4 F (36.9 C) 97.9 F (36.6 C)  SpO2: 97% 97%    Skin clean, dry and intact without evidence of skin break down, no evidence of skin tears noted. IV catheter discontinued intact. Site without signs and symptoms of complications. Dressing and pressure applied. Pt denies pain at this time. No complaints noted.  An After Visit Summary was printed and  given to the patient. Patient escorted via [name], and D/C home via private auto.  [name]

## 2020-07-01 NOTE — Discharge Summary (Addendum)
Physician Discharge Summary  [name] [name] [id number] DOB: [birth year] DOA: 06/17/2020  PCP: [name], MD  Admit date: 06/17/2020   Discharge date: 07/01/2020  Admitted From:  Home.  Disposition:  Home with Home services.  Recommendations for Outpatient Follow-up:  1. Follow up with PCP in 1-2 weeks. 2. Please obtain BMP/CBC in one week. 3.   Advised to follow-up with cardiology in 1 week.   4.   Advised to take torsemide 40 mg daily,  5.   Advised to take digoxin 0.125 mg daily, 6.   Advised to continue Coreg 3.125 mg twice daily,  7.   Advised to continue spironolactone 12.5 mg p.o. twice daily.  Home Health: Yes.( Home PT/OT) Equipment/Devices: None  Discharge Condition: Stable CODE STATUS:Full code Diet recommendation: Heart Healthy  Brief [facility] Course: This 61 years old male with PMH of ICM with EF of 20 to 25%, CAD status post PCI, tobacco abuse, COPD, PAD status post left SFA stenting,  status post right BKA, Atrial fibrillation on Eliquis, OSA Noncompliant with CPAP, history of leaving AMA and noncompliance who presented in the ER for the evaluation of worsening shortness of breath.  Patient is admitted for acute on chronic hypoxic respiratory failure secondary to CHF exacerbation.  Patient was admitted in the ICU for milrinone and Lasix infusion. Patient has biventricular failure secondary to ischemic cardiomyopathy and pulmonary hypertension.  Patient was initially started on milrinone and Lasix infusion which was subsequently discontinued after he was diuresed quite well.  Patient was moved to progressive unit.  He was transitioned to torsemide 40 mg twice daily.  There is a slight increase in serum creatinine, His torsemide dose was changed to 40 mg daily.   Cardiology did some adjustment in his medications.  Patient seems to be improving.  Patient is cleared from cardiology to be discharged, advised medical management for CAD.  Heart rate has been well  controlled with Coreg and digoxin.  PT recommended a skilled nursing facility for rehab but patient refused.  Home health services been arranged. Patient is being discharged home  He was managed for below problems.   Discharge Diagnoses:  Principal Problem:   Acute on chronic systolic CHF (congestive heart failure) (HCC) Active Problems:   Coronary artery disease of native artery of native heart with stable angina pectoris ([place])   Hypertension   Type 2 diabetes mellitus with other specified complication (HCC)   AKI (acute kidney injury) ([place])   COPD (chronic obstructive pulmonary disease) ([place])   Long term current use of opiate analgesic   Chronic hyponatremia   Persistent atrial fibrillation (HCC)   CHF exacerbation (HCC)   Chronic anticoagulation   CHF (congestive heart failure) (HCC)   Acute on chronic heart failure (HCC)  Acute on chronic systolic CHF: Biventricular heart failure: Left ventricular EF less than 20%. Milrinone was discontinued on 1/17. Urine output -4.1 L overnight. Patient still has some leg edema. Continue digoxin, Jardiance, spironolactone, torsemide. Patient condition has improved, approaching euvolemic status. Cardiology following,  cleared for discharge. Torsemide reduced to 40 mg daily.  Acute kidney injury:  Renal functions trending up today could be due to torsemide Avoid nephrotoxic medications.    Persistent A. Fib: Continue Eliquis 5 mg twice daily. Hx. of noncompliance with Eliquis. Continue Coreg and digoxin for heart rate control  Hyperlipidemia : continue atorvastatin 80 mg daily  Diabetes mellitus: Medication noncompliance. Hemoglobin A1c is elevated Regular insulin sliding scale.  COPD is stable.  not in  decompensation  Discharge Instructions  Discharge Instructions    Call MD for:  difficulty breathing, headache or visual disturbances   Complete by: As directed    Call MD for:  persistant dizziness or light-headedness    Complete by: As directed    Call MD for:  persistant nausea and vomiting   Complete by: As directed    Call MD for:  temperature >100.4   Complete by: As directed    Diet - low sodium heart healthy   Complete by: As directed    Diet Carb Modified   Complete by: As directed    Discharge instructions   Complete by: As directed    Advised to follow-up with primary care physician in 1 week.  Advised to follow-up with cardiology in 1 week.  Advised to take torsemide 40 mg daily, advised to take digoxin 0.1 2.5 mg daily advised to continue Coreg 3.125 mg twice daily, advised to continue spironolactone 12.5 mg p.o. twice daily   Increase activity slowly   Complete by: As directed    No wound care   Complete by: As directed      Allergies as of 07/01/2020      Reactions   Dulaglutide Anaphylaxis, Diarrhea, Hives   Other Itching   Skin itching associated with nitro patch   Prednisone Rash      Medication List    STOP taking these medications   metFORMIN 500 MG 24 hr tablet Commonly known as: GLUCOPHAGE-XR   metolazone 5 MG tablet Commonly known as: ZAROXOLYN   metoprolol succinate 25 MG 24 hr tablet Commonly known as: TOPROL-XL     TAKE these medications   albuterol 108 (90 Base) MCG/ACT inhaler Commonly known as: VENTOLIN HFA Inhale into the lungs every 6 (six) hours as needed for wheezing or shortness of breath.   atorvastatin 80 MG tablet Commonly known as: LIPITOR Take 80 mg by mouth daily.   carvedilol 3.125 MG tablet Commonly known as: COREG Take 1 tablet (3.125 mg total) by mouth 2 (two) times daily with a meal.   clopidogrel 75 MG tablet Commonly known as: PLAVIX Take 75 mg by mouth daily.   digoxin 0.125 MG tablet Commonly known as: LANOXIN Take 1 tablet (0.125 mg total) by mouth daily. Start taking on: July 02, 2020   DULoxetine 20 MG capsule Commonly known as: CYMBALTA Take 30 mg by mouth daily.   Eliquis 5 MG Tabs tablet Generic drug:  apixaban Take 5 mg by mouth 2 (two) times daily.   empagliflozin 10 MG Tabs tablet Commonly known as: JARDIANCE Take 10 mg by mouth daily.   ezetimibe 10 MG tablet Commonly known as: ZETIA Take 10 mg by mouth daily.   gabapentin 800 MG tablet Commonly known as: NEURONTIN Take 800 mg by mouth 4 (four) times daily.   ipratropium-albuterol 0.5-2.5 (3) MG/3ML Soln Commonly known as: DUONEB Inhale 3 mLs into the lungs every 6 (six) hours as needed (wheezing/sob).   Combivent Respimat 20-100 MCG/ACT Aers respimat Generic drug: Ipratropium-Albuterol Inhale 1 puff into the lungs every 6 (six) hours as needed for wheezing.   isosorbide mononitrate 30 MG 24 hr tablet Commonly known as: IMDUR Take 30 mg by mouth daily.   Multi-Vitamins Tabs Take 1 tablet by mouth daily.   nitroGLYCERIN 0.4 MG SL tablet Commonly known as: NITROSTAT Place 1 tablet (0.4 mg total) under the tongue every 5 (five) minutes as needed for chest pain.   omeprazole 40 MG capsule Commonly known as:  PRILOSEC Take 40 mg by mouth daily.   Oxycodone HCl 10 MG Tabs Take 10 mg by mouth every 6 (six) hours as needed (pain).   potassium chloride 10 MEQ tablet Commonly known as: KLOR-CON Take 10 mEq by mouth 2 (two) times daily.   sodium chloride 1 g tablet Take 1 tablet (1 g total) by mouth 3 (three) times daily with meals.   spironolactone 25 MG tablet Commonly known as: ALDACTONE Take 0.5 tablets (12.5 mg total) by mouth daily. Start taking on: July 02, 2020   Symbicort 80-4.5 MCG/ACT inhaler Generic drug: budesonide-formoterol Inhale 2 puffs into the lungs daily.   tamsulosin 0.4 MG Caps capsule Commonly known as: FLOMAX Take 1 capsule (0.4 mg total) by mouth daily after supper.   torsemide 20 MG tablet Commonly known as: DEMADEX Take 2 tablets (40 mg total) by mouth daily. What changed: when to take this   traZODone 150 MG tablet Commonly known as: DESYREL Take 150 mg by mouth at  bedtime.   Vericiguat 5 MG Tabs Take 5 mg by mouth daily.       Follow-up Information    [facility] Follow up on 07/20/2020.   Specialty: Cardiology Why: at 1:00pm. Enter through the [facility] entrance Contact information: [address] [place] [phone]       [name], MD Follow up in 1 week(s).   Specialty: Family Medicine Contact information: [place] [address] [phone]        Minna Merritts, MD .   Specialty: Cardiology Contact information: [address] [place] [address] [phone]              Allergies  Allergen Reactions  . Dulaglutide Anaphylaxis, Diarrhea and Hives  . Other Itching    Skin itching associated with nitro patch  . Prednisone Rash    Consultations:  Cardiology   Procedures/Studies: DG Chest 1 View  Result Date: 06/17/2020 CLINICAL DATA:  Shortness of breath and dizziness for 2 days, intermittent chest pain, bilateral lower extremity swelling, no fever EXAM: CHEST  1 VIEW COMPARISON:  Radiograph 01/19/2020, CT 08/30/2019 FINDINGS: Some increased hazy interstitial opacities are present towards the lung bases is central pulmonary vascular congestion, as well as fissural and septal thickening. No consolidative opacity. No pneumothorax or effusion. Cardiomegaly, similar to prior portable radiography. Slight increasing prominence of the central pulmonary arteries is noted. The aorta is calcified. The remaining cardiomediastinal contours are unremarkable. Degenerative changes are present in the imaged spine and shoulders. Remote deformity of the distal right clavicle. No acute osseous or soft tissue abnormality. IMPRESSION: 1. Findings consistent with CHF/volume overload with interstitial pulmonary edema and cardiomegaly. 2. Slight increasing prominence of the central pulmonary arteries,  suspicious for pulmonary artery hypertension. Electronically Signed   By: Lovena Le M.D.   On: 06/17/2020 01:33   DG Chest Port 1 View  Result Date: 06/25/2020 CLINICAL DATA:  Shortness of breath. EXAM: PORTABLE CHEST 1 VIEW COMPARISON:  June 18, 2020. FINDINGS: Stable cardiomegaly with central pulmonary vascular congestion. No pneumothorax is noted. Left internal jugular catheter is noted with distal tip in expected position of left brachiocephalic vein. Mild bibasilar atelectasis or edema is noted with minimal pleural effusions. Bony thorax is unremarkable. IMPRESSION: Stable cardiomegaly with central pulmonary vascular congestion. Mild bibasilar atelectasis or edema is noted with minimal pleural effusions. Electronically Signed   By: Jeneen Rinks  Murlean Caller M.D.   On: 06/25/2020 13:07   DG Chest Port 1 View  Result Date: 06/18/2020 CLINICAL DATA:  Shortness of breath.  COPD.  Central line placement. EXAM: PORTABLE CHEST 1 VIEW COMPARISON:  06/17/2020 FINDINGS: New left jugular central venous catheter is seen with tip overlying the distal left brachiocephalic vein, near the midline. No evidence of pneumothorax. Cardiomegaly remains stable. Diffuse pulmonary vascular congestion is unchanged. No evidence of pulmonary consolidation or pleural effusion. IMPRESSION: New left jugular central venous catheter tip overlies the distal left brachiocephalic vein. No evidence of pneumothorax. Stable cardiomegaly and diffuse pulmonary vascular congestion. Electronically Signed   By: Marlaine Hind M.D.   On: 06/18/2020 14:17   ECHOCARDIOGRAM COMPLETE  Result Date: 06/17/2020    ECHOCARDIOGRAM REPORT   Patient Name:   [name] [name] Date of Exam: 06/17/2020 Medical Rec #:  [id number]      Height:       67.0 in Accession #:    [id number]     Weight:       209.0 lb Date of Birth:  18-Oct-1960       BSA:          2.060 m Patient Age:    70 years       BP:           82/70 mmHg Patient Gender: M              HR:           95 bpm.  Exam Location:  [facility] Procedure: 2D Echo Indications:     CHF-Acute Systolic [id number]  History:         Patient has prior history of Echocardiogram examinations, most                  recent 01/20/2020. CAD, COPD; Risk Factors:Hypertension,                  Diabetes and Dyslipidemia.  Sonographer:     Avanell Shackleton Referring Phys:  [id number] Athena Masse Diagnosing Phys: Ida Rogue MD IMPRESSIONS  1. Left ventricular ejection fraction, by estimation, is <20%. Left ventricular ejection fraction by PLAX is 14 %. The left ventricle has severely decreased function. The left ventricle demonstrates global hypokinesis. The left ventricular internal cavity size was severely dilated. Left ventricular diastolic parameters are indeterminate.  2. Right ventricular systolic function is severely reduced. The right ventricular size is moderately enlarged. There is severely elevated pulmonary artery systolic pressure. The estimated right ventricular systolic pressure is [id number] mmHg.  3. Left atrial size was severely dilated.  4. Right atrial size was severely dilated.  5. Tricuspid valve regurgitation is mild to moderate.  6. The inferior vena cava is dilated in size with <50% respiratory variability, suggesting right atrial pressure of 15 mmHg. FINDINGS  Left Ventricle: Left ventricular ejection fraction, by estimation, is <20%. Left ventricular ejection fraction by PLAX is 14 %. The left ventricle has severely decreased function. The left ventricle demonstrates global hypokinesis. The left ventricular internal cavity size was severely dilated. There is no left ventricular hypertrophy. Left ventricular diastolic parameters are indeterminate. Right Ventricle: The right ventricular size is moderately enlarged. No increase in right ventricular wall thickness. Right ventricular systolic function is severely reduced. There is severely elevated pulmonary artery systolic pressure. The tricuspid regurgitant velocity is 2.88 m/s, and with  an assumed right atrial pressure of 20 mmHg, the estimated right ventricular systolic pressure is [id number] mmHg. Left Atrium:  Left atrial size was severely dilated. Right Atrium: Right atrial size was severely dilated. Pericardium: There is no evidence of pericardial effusion. Mitral Valve: The mitral valve is normal in structure. Mild mitral valve regurgitation. No evidence of mitral valve stenosis. Tricuspid Valve: The tricuspid valve is normal in structure. Tricuspid valve regurgitation is mild to moderate. No evidence of tricuspid stenosis. Aortic Valve: The aortic valve was not well visualized. Aortic valve regurgitation is not visualized. No aortic stenosis is present. Aortic valve mean gradient measures 4.0 mmHg. Aortic valve peak gradient measures 6.7 mmHg. Aortic valve area, by VTI measures 1.02 cm. Pulmonic Valve: The pulmonic valve was normal in structure. Pulmonic valve regurgitation is mild. No evidence of pulmonic stenosis. Aorta: The aortic root is normal in size and structure. Venous: The inferior vena cava is dilated in size with less than 50% respiratory variability, suggesting right atrial pressure of 15 mmHg. IAS/Shunts: No atrial level shunt detected by color flow Doppler.  LEFT VENTRICLE PLAX 2D LV EF:         Left ventricular ejection fraction by PLAX is 14 %. LVIDd:         6.64 cm LVIDs:         6.20 cm LV PW:         1.09 cm LV IVS:        1.22 cm LVOT diam:     2.10 cm LV SV:         18 LV SV Index:   9 LVOT Area:     3.46 cm  LV Volumes (MOD) LV vol d, MOD A2C: 299.0 ml LV vol d, MOD A4C: 284.0 ml LV vol s, MOD A2C: 258.0 ml LV vol s, MOD A4C: 243.0 ml LV SV MOD A2C:     41.0 ml LV SV MOD A4C:     284.0 ml LV SV MOD BP:      40.8 ml IVC IVC diam: 2.73 cm LEFT ATRIUM             Index       RIGHT ATRIUM           Index LA diam:        5.80 cm 2.81 cm/m  RA Area:     31.80 cm LA Vol (A2C):   78.7 ml 38.20 ml/m RA Volume:   130.00 ml 63.09 ml/m LA Vol (A4C):   75.4 ml 36.59 ml/m LA  Biplane Vol: 77.1 ml 37.42 ml/m  AORTIC VALVE AV Area (Vmax):    1.07 cm AV Area (Vmean):   1.02 cm AV Area (VTI):     1.02 cm AV Vmax:           129.67 cm/s AV Vmean:          93.300 cm/s AV VTI:            0.181 m AV Peak Grad:      6.7 mmHg AV Mean Grad:      4.0 mmHg LVOT Vmax:         40.20 cm/s LVOT Vmean:        27.600 cm/s LVOT VTI:          0.053 m LVOT/AV VTI ratio: 0.29  AORTA Ao Root diam: 3.20 cm MR Peak grad: 92.2 mmHg   TRICUSPID VALVE MR Mean grad: 51.0 mmHg   TR Peak grad:   33.2 mmHg MR Vmax:      480.00 cm/s TR Vmax:  288.00 cm/s MR Vmean:     329.0 cm/s                           SHUNTS                           Systemic VTI:  0.05 m                           Systemic Diam: 2.10 cm Ida Rogue MD Electronically signed by Ida Rogue MD Signature Date/Time: 06/17/2020/7:04:59 PM    Final    US ABDOMEN LIMITED RUQ (LIVER/GB)  Result Date: 06/17/2020 CLINICAL DATA:  Hyperbilirubinemia EXAM: ULTRASOUND ABDOMEN LIMITED RIGHT UPPER QUADRANT COMPARISON:  03/07/2014 abdominal CT FINDINGS: Gallbladder: Gallbladder wall thickening to 4 mm.  No stone or focal tenderness. Common bile duct: Diameter: 7 mm. Liver: No focal lesion identified. Within normal limits in parenchymal echogenicity. Nodular appearance of the liver surface. Portal vein is patent on color Doppler imaging with normal direction of blood flow towards the liver. Portal vein flow is pulsatile, presumably from cirrhosis, but possibly also related to right heart failure given chest CT findings March 2021. Other: Small volume ascites. IMPRESSION: 1. Cirrhotic appearing liver surface with small volume ascites and pulsatile portal vein flow. 2. Thickened gallbladder without calculus, considered reactive. Electronically Signed   By: Monte Fantasia M.D.   On: 06/17/2020 05:46      Subjective: Patient was seen and examined at bedside.  Overnight events noted.  Patient reports feeling much better and wants to be discharged.   Home health services been arranged.  Discharge Exam: Vitals:   07/01/20 0434 07/01/20 0802  BP: 116/74 114/71  Pulse: 72 75  Resp: 18 18  Temp: 97.9 F (36.6 C) 98.4 F (36.9 C)  SpO2: 91% 97%   Vitals:   06/30/20 1932 07/01/20 0434 07/01/20 0435 07/01/20 0802  BP: 122/76 116/74  114/71  Pulse: 71 72  75  Resp: '18 18  18  '$ Temp: 98 F (36.7 C) 97.9 F (36.6 C)  98.4 F (36.9 C)  TempSrc: Oral Oral  Oral  SpO2: 95% 91%  97%  Weight:   78.7 kg   Height:        General: Pt is alert, awake, not in acute distress Cardiovascular: RRR, S1/S2 +, no rubs, no gallops Respiratory: CTA bilaterally, no wheezing, no rhonchi Abdominal: Soft, NT, ND, bowel sounds + Extremities: no edema, no cyanosis.  Right BKA.    The results of significant diagnostics from this hospitalization (including imaging, microbiology, ancillary and laboratory) are listed below for reference.     Microbiology: No results found for this or any previous visit (from the past 240 hour(s)).   Labs: BNP (last 3 results) Recent Labs    06/12/20 1327 06/14/20 1413 06/17/20 0049  BNP 1,985.3* 2,062.1* [id number]*   Basic Metabolic Panel: Recent Labs  Lab 06/27/20 0513 06/28/20 0631 06/28/20 0939 06/29/20 0349 06/30/20 0437 07/01/20 0412  NA 136  --  136 136 136 138  K 4.3  --  4.0 3.7 3.7 4.0  CL 86*  --  89* 89* 93* 93*  CO2 38*  --  37* 35* 33* 34*  GLUCOSE 104*  --  149* 118* 119* 133*  BUN 35*  --  34* 36* 37* 42*  CREATININE 0.99  --  1.06 1.10 1.11 1.33*  CALCIUM  9.5  --  9.3 9.1 9.2 9.4  MG 2.2 2.0  --  2.1 2.0 2.1  PHOS 3.7 4.3  --  4.2 4.3 4.1   Liver Function Tests: No results for input(s): AST, ALT, ALKPHOS, BILITOT, PROT, ALBUMIN in the last 168 hours. No results for input(s): LIPASE, AMYLASE in the last 168 hours. No results for input(s): AMMONIA in the last 168 hours. CBC: Recent Labs  Lab 06/27/20 0513 06/28/20 0631 06/29/20 0349 06/30/20 0437 07/01/20 0412  WBC 9.8 10.1  10.9* 11.9* 12.3*  NEUTROABS 8.1* 8.0* 8.6* 9.8* 9.9*  HGB 13.1 12.8* 12.7* 13.5 14.3  HCT 39.4 39.6 39.1 39.3 42.9  MCV 81.2 81.5 80.5 78.6* 79.4*  PLT 128* 137* 159 158 187   Cardiac Enzymes: No results for input(s): CKTOTAL, CKMB, CKMBINDEX, TROPONINI in the last 168 hours. BNP: Invalid input(s): POCBNP CBG: Recent Labs  Lab 06/30/20 2029 07/01/20 0037 07/01/20 0430 07/01/20 0502 07/01/20 0804  GLUCAP 119* 134* 130* 139* 108*   D-Dimer No results for input(s): DDIMER in the last 72 hours. Hgb A1c No results for input(s): HGBA1C in the last 72 hours. Lipid Profile No results for input(s): CHOL, HDL, LDLCALC, TRIG, CHOLHDL, LDLDIRECT in the last 72 hours. Thyroid function studies No results for input(s): TSH, T4TOTAL, T3FREE, THYROIDAB in the last 72 hours.  Invalid input(s): FREET3 Anemia work up No results for input(s): VITAMINB12, FOLATE, FERRITIN, TIBC, IRON, RETICCTPCT in the last 72 hours. Urinalysis    Component Value Date/Time   COLORURINE STRAW (A) 09/10/2019 2028   APPEARANCEUR CLEAR (A) 09/10/2019 2028   APPEARANCEUR Clear 02/06/2014 1412   LABSPEC 1.003 (L) 09/10/2019 2028   LABSPEC 1.015 02/06/2014 1412   PHURINE 6.0 09/10/2019 2028   GLUCOSEU NEGATIVE 09/10/2019 2028   GLUCOSEU Negative 02/06/2014 1412   HGBUR NEGATIVE 09/10/2019 2028   BILIRUBINUR NEGATIVE 09/10/2019 2028   BILIRUBINUR Negative 02/06/2014 1412   KETONESUR NEGATIVE 09/10/2019 2028   PROTEINUR NEGATIVE 09/10/2019 2028   NITRITE NEGATIVE 09/10/2019 2028   LEUKOCYTESUR NEGATIVE 09/10/2019 2028   LEUKOCYTESUR Negative 02/06/2014 1412   Sepsis Labs Invalid input(s): PROCALCITONIN,  WBC,  LACTICIDVEN Microbiology No results found for this or any previous visit (from the past 240 hour(s)).   Time coordinating discharge: Over 30 minutes  SIGNED:   Shawna Clamp, MD  [facility] 07/01/2020, 11:01 AM Pager   If 7PM-7AM, please contact night-coverage [email or web address]

## 2020-07-01 NOTE — TOC Progression Note (Signed)
Transition of Care [facility]) - Progression Note    Patient Details  Name: [name] [name] MRN: [id number] Date of Birth: 07-02-1960  [facility] CM/SW Contact  Izola Price, RN Phone Number: 07/01/2020, 12:56 PM  Clinical Narrative:   Awaiting confirmation on [facility] acceptance but patient has discharge orders. Spoke with patient and states has a RW at home. Michela Pitcher he would take care of [facility] when he got home, but I asked him to wait just a bit so I can confirm. [facility] has not yet reached out to him and CM has message in for confirmation. Simmie Davies RN CM    Expected Discharge Plan: [place] Barriers to Discharge: Continued Medical Work up  Expected Discharge Plan and Services Expected Discharge Plan: [place] In-house Referral: NA   Post Acute Care Choice: [place] arrangements for the past 2 months: Single Family Home Expected Discharge Date: 07/01/20                         [facility] Arranged: PT,OT           Social Determinants of Health (SDOH) Interventions    Readmission Risk Interventions Readmission Risk Prevention Plan 06/30/2020 11/09/2018 11/08/2018  Transportation Screening Complete - Complete  HRI or Home Care Consult - - Complete  Social Work Consult for [place] Planning/Counseling - Not Complete -  SW consult not completed comments - na -  Palliative Care Screening - Not Applicable -  Medication Review Press photographer) Complete - Complete  PCP or Specialist appointment within 3-5 days of discharge Complete - -  [place] or Home Care Consult Complete - -  SW Recovery Care/Counseling Consult Complete - -  Palliative Care Screening Not Applicable - -  [place] Not Applicable - -  Some recent data might be hidden

## 2020-07-01 NOTE — Progress Notes (Addendum)
Progress Note  Patient Name: [name] [name] Date of Encounter: 07/01/2020  Primary Cardiologist: Rockey Situ  Subjective   Non chest pain or dyspnea. Overall, he feels significantly improved when compared to his admission. On room air. Documented UOP 1.3 L for the past 24 hours with a net - 36.4 L for the admission. Weight 79.8-->78.7 kg over the past 24 hours. Initial weight at time of cardiology consult on 1/8 of 94.8 kg. Slight bump in renal function this morning with a BUN/SCR trend of 37/1.11-->42/1.33. Potassium 4.0. He indicates he is being discharged today.   Inpatient Medications    Scheduled Meds: . apixaban  5 mg Oral BID  . atorvastatin  80 mg Oral Daily  . carvedilol  3.125 mg Oral BID WC  . digoxin  0.125 mg Oral Daily  . DULoxetine  30 mg Oral Daily  . empagliflozin  10 mg Oral Daily  . ezetimibe  10 mg Oral Daily  . gabapentin  400 mg Oral QID  . insulin aspart  0-20 Units Subcutaneous Q4H  . mometasone-formoterol  2 puff Inhalation BID  . multivitamin with minerals  1 tablet Oral Daily  . pantoprazole  40 mg Oral Daily  . sodium chloride flush  3 mL Intravenous Q12H  . spironolactone  12.5 mg Oral Daily  . tamsulosin  0.4 mg Oral QPC supper  . torsemide  40 mg Oral BID  . traZODone  150 mg Oral QHS  . umeclidinium bromide  1 puff Inhalation Daily   Continuous Infusions: . sodium chloride Stopped (06/24/20 1400)   PRN Meds: sodium chloride, acetaminophen, Ipratropium-Albuterol, ipratropium-albuterol, nitroGLYCERIN, ondansetron (ZOFRAN) IV, oxyCODONE, sodium chloride flush   Vital Signs    Vitals:   06/30/20 1932 07/01/20 0434 07/01/20 0435 07/01/20 0802  BP: 122/76 116/74  114/71  Pulse: 71 72  75  Resp: '18 18  18  '$ Temp: 98 F (36.7 C) 97.9 F (36.6 C)  98.4 F (36.9 C)  TempSrc: Oral Oral  Oral  SpO2: 95% 91%  97%  Weight:   78.7 kg   Height:        Intake/Output Summary (Last 24 hours) at 07/01/2020 1014 Last data filed at 07/01/2020  1007 Gross per 24 hour  Intake 960 ml  Output 2450 ml  Net -1490 ml   Filed Weights   06/29/20 0219 06/30/20 0448 07/01/20 0435  Weight: 83.5 kg 79.8 kg 78.7 kg    Telemetry    Afib with ventricular rates in the 70s bpm, rare isolated PVCs - Personally Reviewed  ECG    No new tracings - Personally Reviewed  Physical Exam   GEN: No acute distress.   Neck: No JVD. Cardiac: IRIR, II/VI systolic murmur LLSB, no rubs, or gallops.  Respiratory: Clear to auscultation bilaterally.  GI: Soft, nontender, non-distended.   MS: No edema; No deformity. Neuro:  Alert and oriented x 3; Nonfocal.  Psych: Normal affect.  Labs    Chemistry Recent Labs  Lab 06/29/20 0349 06/30/20 0437 07/01/20 0412  NA 136 136 138  K 3.7 3.7 4.0  CL 89* 93* 93*  CO2 35* 33* 34*  GLUCOSE 118* 119* 133*  BUN 36* 37* 42*  CREATININE 1.10 1.11 1.33*  CALCIUM 9.1 9.2 9.4  GFRNONAA >60 >60 >60  ANIONGAP '12 10 11     '$ Hematology Recent Labs  Lab 06/29/20 0349 06/30/20 0437 07/01/20 0412  WBC 10.9* 11.9* 12.3*  RBC 4.86 5.00 5.40  HGB 12.7* 13.5 14.3  HCT 39.1 39.3 42.9  MCV 80.5 78.6* 79.4*  MCH 26.1 27.0 26.5  MCHC 32.5 34.4 33.3  RDW 20.7* 20.9* 20.9*  PLT 159 158 187    Cardiac EnzymesNo results for input(s): TROPONINI in the last 168 hours. No results for input(s): TROPIPOC in the last 168 hours.   BNPNo results for input(s): BNP, PROBNP in the last 168 hours.   DDimer No results for input(s): DDIMER in the last 168 hours.   Radiology    No results found.  Cardiac Studies   Echo 06/17/2020 1. Left ventricular ejection fraction, by estimation, is <20%. Left  ventricular ejection fraction by PLAX is 14 %. The left ventricle has  severely decreased function. The left ventricle demonstrates global  hypokinesis. The left ventricular internal  cavity size was severely dilated. Left ventricular diastolic parameters  are indeterminate.  2. Right ventricular systolic function is  severely reduced. The right  ventricular size is moderately enlarged. There is severely elevated  pulmonary artery systolic pressure. The estimated right ventricular  systolic pressure is [id number] mmHg.  3. Left atrial size was severely dilated.  4. Right atrial size was severely dilated.  5. Tricuspid valve regurgitation is mild to moderate.  6. The inferior vena cava is dilated in size with <50% respiratory  variability, suggesting right atrial pressure of 15 mmHg.  __________  01/2020:  Prox RCA-1 lesion is 80% stenosed.  Prox RCA-2 lesion is 20% stenosed.  Prox RCA to Mid RCA lesion is 80% stenosed.  Prox LAD lesion is 20% stenosed.  1st Diag lesion is 80% stenosed.  Mid LAD lesion is 20% stenosed.  Dist LAD lesion is 60% stenosed.  Prox Cx to Mid Cx lesion is 90% stenosed.  LPAV lesion is 95% stenosed.  1. Significant underlying three-vessel coronary artery disease. Patent proximal LAD stent with mild in-stent restenosis. Moderate diffuse disease in the distal LAD. Significant stenosis in the proximal left circumflex at the origin of the posterior AV groove artery which is also heavily diseased at the ostium. This is a bifurcation lesion and heavily calcified. RCA stent is patent. However, there is significant proximal disease in the whole mid to distal segment is diffusely diseased and small caliber. 2. Left ventricular angiography was not performed. EF was severe reduced by echo. 3. Moderately elevated left ventricular end-diastolic pressure at 28 mmHg  Recommendations: The patient's cardiomyopathy seems to be out of proportion to his coronary artery disease as the LAD itself does not seem to have obstructive disease at the present time. The RCA is diffusely diseased and too small to stent. The only target for revascularization would be the left circumflex but it is a calcified bifurcation lesion and likely requires atherectomy. In order to do this safely, the patient has  to be optimized from a heart failure standpoint as he appears to be volume overloaded. Recommend intravenous diuresis. If limited by hypotension, we might need to consider inotropic support and small dose norepinephrine drip initially. I added small dose digoxin. Treat underlying atrial fibrillation. Resume heparin drip 8 hours after sheath pull. __________  2D echo 01/2020: 1. Left ventricular ejection fraction, by estimation, is <20%. The left  ventricle has severely decreased function. The left ventricle demonstrates  global hypokinesis. The left ventricular internal cavity size was  moderately dilated. Left ventricular  diastolic parameters are indeterminate.  2. Right ventricular systolic function is moderately reduced. The right  ventricular size is moderately enlarged. There is normal pulmonary artery  systolic pressure. The estimated  right ventricular systolic pressure is  0000000 mmHg.  3. Left atrial size was mildly dilated.  4. Mild mitral valve regurgitation.  5. Tricuspid valve regurgitation is moderate. __________  2D echo 09/2019: 1. Left ventricular ejection fraction, by estimation, is 20 to 25%. The  left ventricle has severely decreased function. The left ventricle  demonstrates global hypokinesis. The left ventricular internal cavity size  was severely dilated. There is mild  left ventricular hypertrophy. Indeterminate diastolic filling due to E-A  fusion.  2. Right ventricular systolic function is moderately reduced. The right  ventricular size is mildly enlarged. There is moderately elevated  pulmonary artery systolic pressure. The estimated right ventricular  systolic pressure is 123456 mmHg.  3. Left atrial size was mildly dilated.  4. Right atrial size was moderately dilated.  5. The mitral valve is degenerative. Mild mitral valve regurgitation. No  evidence of mitral stenosis.  6. Tricuspid valve regurgitation is mild to moderate.  7. The aortic  valve was not well visualized. Aortic valve regurgitation  is not visualized. Mild aortic valve sclerosis is present, with no  evidence of aortic valve stenosis.  8. The inferior vena cava is dilated in size with <50% respiratory  variability, suggesting right atrial pressure of 15 mmHg. __________  2D Echo 10/2018: 1. The left ventricle has severely reduced systolic function, with an ejection fraction of 20-25%. The cavity size was moderately dilated. Indeterminate diastolic filling due to E-A fusion. Left ventricular diffuse hypokinesis. 2. The right ventricle has severely reduced systolic function. The cavity was mildly enlarged. There is no increase in right ventricular wall thickness. Right ventricular systolic pressure is moderately elevated with an estimated pressure of 59.6 mmHg. 3. Left atrial size was mildly dilated. 4. Right atrial size was mildly dilated. 5. The mitral valve is degenerative. Mild thickening of the mitral valve leaflet. Mild calcification of the mitral valve leaflet. There is mild mitral annular calcification present. 6. The tricuspid valve is degenerative. Tricuspid valve regurgitation is moderate. 7. The aortic valve was not well visualized. Moderate thickening of the aortic valve. Moderate calcification of the aortic valve. 8. The inferior vena cava was dilated in size with <50% respiratory variability. __________  LHC 04/2017:  Prox RCA-1 lesion is 70% stenosed.  Prox RCA-2 lesion is 20% stenosed.  Mid RCA lesion is 80% stenosed.  Mid RCA to Dist RCA lesion is 70% stenosed.  Ost LAD to Prox LAD lesion is 30% stenosed.  Ost 1st Diag to 1st Diag lesion is 80% stenosed.  Prox LAD to Mid LAD lesion is 60% stenosed.  Dist LAD lesion is 60% stenosed.  Prox Cx to Mid Cx lesion is 90% stenosed.  Ost 1st Mrg to 1st Mrg lesion is 85% stenosed.  1. Significant three-vessel coronary artery disease with patent stent in the RCA and LAD. There is  significant proximal RCA disease before the stent as well as diffuse mid and distal disease in a vessel that appears to be about 2.5 mm in diameter. There is significant bifurcation stenosis in the proximal left circumflex with a large OM1. The LAD has moderate disease. The coronary arteries are moderately calcified and diffusely diseased throughout. 2. Left ventricular angiography was not performed. EF was moderately to severely reduced by echo. 3. Severely elevated left ventricular end-diastolic pressure at 34 mmHg.  Recommendations: This is overall a difficult situation. Revascularization options are somewhat limited due to diffuse disease overall. Given that he is diabetic and has cardiomyopathy, best option might be CABG if  he is found to be a candidate. We need to optimize his heart failure first with outpatient referral to cardiothoracic surgery for evaluation.  I am going to increase intravenous diuresis today and the patient possibly can be discharged home tomorrow on medical therapy.  Patient Profile     60 y.o. 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  Assessment & Plan    1. BiV failure/ICM/pulmonary hypertension: -Patient has severe BiV failure that required treatment with milrinone this admission, which has subsequently been discontinued and he has diuresed quite well, nearly 20 kg down today when compared to his initial consult weight -He was transitioned to torsemide 40 mg bid on 1/21 with noted bump in his renal  function this morning -Will decrease torsemide to 40 mg daily -PTA Verquvo can be resumed -Hold PTA metolazone with AKI, this can be reassessed in follow up -PTA he was taking same dose of torsemide 40 mg bid and tolerating well  -Continue GDMT including Coreg, digoxin, spironolactone, and Jardiance  -He has not been started on an ACEi/ARB/ARNI given intermittent relative hypotension, this will be addressed in the outpatient setting  -CHF education   2. CAD involving the native coronary arteries with elevated troponin: -He was initially evaluated by [facility] with recommendation for CABG, which was refused by the patient. His cath films have been reviewed by interventional cardiology with recommendation to continue medical therapy as outlined in their note on 06/30/2020 (please see for details) -He remains on Eliquis in place of ASA -Lipitor/Zetia -Coreg  3. Permanent Afib: -Ventricular rates well controlled  -Coreg and digoxin as above -Last digoxin level of 0.6 from 06/27/2020 -CHADS2VASc at least 4 (CHF, HTN, DM2, vascular disease) -Continue Eliquis  4. AKI: -Slight bump in his renal function this morning  -Will arrange follow up BMP in our office next week  5. HTN: -Blood pressure has been soft at times, though is currently well controlled -Continue medications as outlined above   6. HLD: -LDL 77 from 01/2020 with goal being < 70, previously well controlled in 11/2018 -Lipitor 80 mg, Zetia 10 mg -Follow up as outpatient    For questions or updates, please contact [place] Please consult [email or web address] for contact info under Cardiology/STEMI.    Signed, Christell Faith, PA-C [facility] Pager: [phone] 07/01/2020, 10:14 AM

## 2020-07-01 NOTE — TOC Transition Note (Signed)
Transition of Care [facility]) - CM/SW Discharge Note   Patient Details  Name: [name] [name] MRN: [id number] Date of Birth: [birth year]  Transition [facility] Care [facility] CM/SW Contact:  Izola Price, RN Phone Number: 07/01/2020, 1:26 PM   Clinical Narrative:   07/01/20 Discharge today. Tillie Rung at [facility] confirmed acceptance of patient. Spoke with patient and he has a RW at home. Simmie Davies RN CM    Final next level of care: [place] Barriers to Discharge: Barriers Resolved   Patient Goals and CMS Choice Patient states their goals for this hospitalization and ongoing recovery are:: to get better CMS Medicare.gov Compare Post Acute Care list provided to:: Patient Choice offered to / list presented to : Patient  Discharge Placement                       Discharge Plan and Services In-house Referral: NA   Post Acute Care Choice: Home Health          DME Arranged:  (Patient has a Conservation officer, nature at home) DME Agency: [facility] [place]: PT,OT   Date HH Agency Contacted: 07/01/20 Time [place]: [id number] Representative spoke with at [place]: [place] ([place]) Interventions     Readmission Risk Interventions Readmission Risk Prevention Plan 06/30/2020 11/09/2018 11/08/2018  Transportation Screening Complete - Complete  HRI or [place] - - Complete  Social Work Consult for [place] Planning/Counseling - Not Complete -  SW consult not completed comments - na -  Palliative Care Screening - Not Applicable -  Medication Review Press photographer) Complete - Complete  PCP or Specialist appointment within 3-5 days of discharge Complete - -  [place] or Home Care Consult Complete - -  SW Recovery Care/Counseling Consult Complete - -  Palliative Care Screening Not Applicable - -  [place] Not Applicable - -  Some recent data might be hidden

## 2020-07-01 NOTE — Discharge Instructions (Signed)
Advised to follow-up with primary care physician in 1 week.   Advised to follow-up with cardiology in 1 week.   Advised to take torsemide 40 mg daily, advised to take digoxin 0.125 mg daily,  advised to continue Coreg 3.125 mg twice daily, advised to continue spironolactone 12.5 mg p.o. twice daily

## 2020-07-03 DIAGNOSIS — Z515 Encounter for palliative care: Secondary | ICD-10-CM

## 2020-07-04 ENCOUNTER — Other Ambulatory Visit (INDEPENDENT_AMBULATORY_CARE_PROVIDER_SITE_OTHER): Payer: Self-pay | Admitting: Nurse Practitioner

## 2020-07-04 DIAGNOSIS — I739 Peripheral vascular disease, unspecified: Secondary | ICD-10-CM

## 2020-07-05 ENCOUNTER — Other Ambulatory Visit: Payer: Self-pay

## 2020-07-05 ENCOUNTER — Ambulatory Visit (INDEPENDENT_AMBULATORY_CARE_PROVIDER_SITE_OTHER): Payer: Medicare Other | Admitting: Nurse Practitioner

## 2020-07-05 ENCOUNTER — Ambulatory Visit (INDEPENDENT_AMBULATORY_CARE_PROVIDER_SITE_OTHER): Payer: Medicare Other

## 2020-07-05 VITALS — BP 107/66 | HR 59 | Ht 68.0 in | Wt 178.0 lb

## 2020-07-05 DIAGNOSIS — I83029 Varicose veins of left lower extremity with ulcer of unspecified site: Secondary | ICD-10-CM

## 2020-07-05 DIAGNOSIS — I5020 Unspecified systolic (congestive) heart failure: Secondary | ICD-10-CM | POA: Diagnosis not present

## 2020-07-05 DIAGNOSIS — I10 Essential (primary) hypertension: Secondary | ICD-10-CM

## 2020-07-05 DIAGNOSIS — I70245 Atherosclerosis of native arteries of left leg with ulceration of other part of foot: Secondary | ICD-10-CM | POA: Diagnosis not present

## 2020-07-05 DIAGNOSIS — I739 Peripheral vascular disease, unspecified: Secondary | ICD-10-CM | POA: Diagnosis not present

## 2020-07-05 DIAGNOSIS — L97929 Non-pressure chronic ulcer of unspecified part of left lower leg with unspecified severity: Secondary | ICD-10-CM

## 2020-07-05 NOTE — Progress Notes (Signed)
Subjective:    Patient ID: [name] [name], male    DOB: [birth year], 60 y.o.   MRN: [id number] Chief Complaint  Patient presents with  . Follow-up    U/S will need unna boot    The patient returns to the office for followup and review of the noninvasive studies. There has been a significant deterioration in the lower extremity symptoms.  The patient notes interval shortening of their claudication distance and development of mild rest pain symptoms.  The patient was recently hospitalized for heart failure exacerbation for several weeks.  During that time it is believed that he received pressors.  The patient now has ulcerations on his lower extremity toes that were not present before.  There have been no significant changes to the patient's overall health care.  The patient denies amaurosis fugax or recent TIA symptoms. There are no recent neurological changes noted. The patient denies history of DVT, PE or superficial thrombophlebitis. The patient denies recent episodes of angina or shortness of breath.   ABI's Rt=n/a and Lt=0.73 (previous ABI's Rt=n/a and Lt=1.02) Duplex US of the lower extremity arterial system shows dampened monophasic tibial artery waveforms with dampened left toe waveforms.   Review of Systems  Cardiovascular: Positive for leg swelling.  Neurological: Positive for weakness.  Hematological: Bruises/bleeds easily.  All other systems reviewed and are negative.      Objective:   Physical Exam Vitals reviewed.  HENT:     Head: Normocephalic.  Cardiovascular:     Rate and Rhythm: Normal rate.     Pulses:          Dorsalis pedis pulses are 1+ on the left side.  Pulmonary:     Effort: Pulmonary effort is normal.  Musculoskeletal:     Left lower leg: Edema present.     Right Lower Extremity: Right leg is amputated below knee.  Feet:     Left foot:     Skin integrity: Ulcer present.  Skin:    Findings: Bruising present.  Neurological:     Mental Status:  He is alert and oriented to person, place, and time.  Psychiatric:        Mood and Affect: Mood normal.        Behavior: Behavior normal.        Thought Content: Thought content normal.        Judgment: Judgment normal.     BP 107/66   Pulse (!) 59   Ht '5\' 8"'$  (1.727 m)   Wt 178 lb (80.7 kg)   BMI 27.06 kg/m   Past Medical History:  Diagnosis Date  . Arthritis   . Asthma   . Atherosclerosis   . BPH (benign prostatic hyperplasia)   . Carotid arterial disease ([place])   . Charcot's joint of foot, right   . Chronic combined systolic (congestive) and diastolic (congestive) heart failure ([place])    a. 02/2013 EF 50% by LV gram; b. 04/2017 Echo: EF 25-30%. diff HK. Gr2 DD. Mod dil LA/RV. PASP 57mHg.  .Marland KitchenCOPD (chronic obstructive pulmonary disease) ([place]   . Coronary artery disease    a. 2013 S/P PCI of LAD ([facility];  b. 03/2013 PCI: RCA 90p ( 2.5 x 23 mm DES); c. 02/2014 Cath: patent RCA stent->Med Rx; d. 04/2017 Cath: LM nl, LAd 30ost/p, 654m, D1 80ost, LCX 90p/m, OM1 85, RCA 70/20p, 8063m0d->Referred for CT Surg-felt to be poor candidate.  . Diabetic neuropathy ([place] . Diabetic ulcer of right  foot ([place])   . Hernia   . Hyperlipidemia   . Hypertension   . Ischemic cardiomyopathy    a. 04/2017 Echo: EF 25-30%; b. 08/2017 Cardiac MRI ([facility]): EF 19%, sev glob HK. RVEF 20%, mod BAE, triv MR, mild-mod TR. Basal lateral subendocardial infarct (viable), inf/infsept, dist septal ischemia, basal to mid lat peri-infarct ischemia.  . Morbid obesity ([place])   . Neuropathy   . PAD (peripheral artery disease) ([place])    a. Followed by Dr. Lucky Cowboy; b. 08/2010 Periph Angio: RSFA 70-80p (6X60 self-expanding stent); c. 09/2010 Periph Angio: L SFA 100p (7x unknown length self-expanding stent); d. 06/2015 Periph Angio: R SFA short segment occlusion (6x12 self-expanding stent).  . Restless leg syndrome   . Sleep apnea   . Subclavian artery stenosis, right ([place])   . Syncope and collapse   . Tobacco use    a. 75+ yr hx  - still smoking 1ppd, down from 2 ppd.  . Type II diabetes mellitus ([place])   . Varicose vein     Social History   Socioeconomic History  . Marital status: Single    Spouse name: Not on file  . Number of children: 0  . Years of education: 8  . Highest education level: 12th grade  Occupational History  . Occupation: on disability  Tobacco Use  . Smoking status: Current Every Day Smoker    Packs/day: 1.00    Years: 41.00    Pack years: 41.00    Types: Cigarettes  . Smokeless tobacco: Never Used  Vaping Use  . Vaping Use: Never used  Substance and Sexual Activity  . Alcohol use: No  . Drug use: No  . Sexual activity: Yes  Other Topics Concern  . Not on file  Social History Narrative   Lives at home in [place] with girlfriend. Independent at baseline.   Social Determinants of Health   Financial Resource Strain: Not on file  Food Insecurity: Not on file  Transportation Needs: Not on file  Physical Activity: Not on file  Stress: Not on file  Social Connections: Not on file  Intimate Partner Violence: Not on file    Past Surgical History:  Procedure Laterality Date  . ABDOMINAL AORTAGRAM N/A 06/23/2013   Procedure: ABDOMINAL Maxcine Ham;  Surgeon: Wellington Hampshire, MD;  Location: [place];  Service: Cardiovascular;  Laterality: N/A;  . AMPUTATION Right 04/01/2018   Procedure: AMPUTATION BELOW KNEE;  Surgeon: Algernon Huxley, MD;  Location: [facility] ORS;  Service: Vascular;  Laterality: Right;  . APPENDECTOMY    . CARDIAC CATHETERIZATION  10/14   [facility]; X1 STENT PROXIMAL RCA  . CARDIAC CATHETERIZATION  02/2010   [facility]  . CARDIAC CATHETERIZATION  02/21/2014   [facility]  . CLAVICLE SURGERY    . HERNIA REPAIR    . IRRIGATION AND DEBRIDEMENT FOOT Right 01/04/2018   Procedure: IRRIGATION AND DEBRIDEMENT FOOT;  Surgeon: Samara Deist, DPM;  Location: [facility] OR[facility];  Service: Podiatry;  Laterality: Right;  . LEFT HEART CATH AND CORONARY ANGIOGRAPHY N/A 01/20/2020   Procedure: LEFT HEART  CATH AND CORONARY ANGIOGRAPHY;  Surgeon: Wellington Hampshire, MD;  Location: [place] [facility];  Service: Cardiovascular;  Laterality: N/A;  . LEFT HEART CATH AND CORS/GRAFTS ANGIOGRAPHY N/A 04/28/2017   Procedure: LEFT HEART CATH AND CORONARY ANGIOGRAPHY;  Surgeon: Wellington Hampshire, MD;  Location: [place] [facility];  Service: Cardiovascular;  Laterality: N/A;  . LOWER EXTREMITY ANGIOGRAPHY Right 03/03/2018   Procedure: LOWER EXTREMITY ANGIOGRAPHY;  Surgeon: Katha Cabal, MD;  Location: [place] [facility];  Service: Cardiovascular;  Laterality: Right;  . LOWER EXTREMITY ANGIOGRAPHY Left 08/24/2018   Procedure: LOWER EXTREMITY ANGIOGRAPHY;  Surgeon: Algernon Huxley, MD;  Location: [place] [facility];  Service: Cardiovascular;  Laterality: Left;  . LOWER EXTREMITY ANGIOGRAPHY Left 06/14/2019   Procedure: LOWER EXTREMITY ANGIOGRAPHY;  Surgeon: Algernon Huxley, MD;  Location: [place] [facility];  Service: Cardiovascular;  Laterality: Left;  . LOWER EXTREMITY ANGIOGRAPHY Left 08/02/2019   Procedure: LOWER EXTREMITY ANGIOGRAPHY;  Surgeon: Algernon Huxley, MD;  Location: [place] [facility];  Service: Cardiovascular;  Laterality: Left;  . PERIPHERAL ARTERIAL STENT GRAFT     x2 left/right  . PERIPHERAL VASCULAR BALLOON ANGIOPLASTY Right 01/06/2018   Procedure: PERIPHERAL VASCULAR BALLOON ANGIOPLASTY;  Surgeon: Katha Cabal, MD;  Location: [place] [facility];  Service: Cardiovascular;  Laterality: Right;  . PERIPHERAL VASCULAR CATHETERIZATION N/A 07/06/2015   Procedure: Abdominal Aortogram w/Lower Extremity;  Surgeon: Algernon Huxley, MD;  Location: [place] [facility];  Service: Cardiovascular;  Laterality: N/A;  . PERIPHERAL VASCULAR CATHETERIZATION  07/06/2015   Procedure: Lower Extremity Intervention;  Surgeon: Algernon Huxley, MD;  Location: [place] [facility];  Service: Cardiovascular;;    Family History  Problem Relation Age of Onset  . Heart attack Father   . Heart disease Father   .  Hypertension Father   . Hyperlipidemia Father   . Hypertension Mother   . Hyperlipidemia Mother     Allergies  Allergen Reactions  . Dulaglutide Anaphylaxis, Diarrhea and Hives  . Other Itching    Skin itching associated with nitro patch  . Prednisone Rash    CBC Latest Ref Rng & Units 07/01/2020 06/30/2020 06/29/2020  WBC 4.0 - 10.5 K/uL 12.3(H) 11.9(H) 10.9(H)  Hemoglobin 13.0 - 17.0 g/dL 14.3 13.5 12.7(L)  Hematocrit 39.0 - 52.0 % 42.9 39.3 39.1  Platelets 150 - 400 K/uL 187 158 159      CMP     Component Value Date/Time   NA 135 07/07/2020 1211   NA 136 06/08/2014 1037   K 3.8 07/07/2020 1211   K 4.3 06/08/2014 1037   CL 86 (L) 07/07/2020 1211   CL 101 06/08/2014 1037   CO2 32 (H) 07/07/2020 1211   CO2 27 06/08/2014 1037   GLUCOSE 278 (H) 07/07/2020 1211   GLUCOSE 133 (H) 07/01/2020 0412   GLUCOSE 415 (H) 06/08/2014 1037   BUN 42 (H) 07/07/2020 1211   BUN 9 06/08/2014 1037   CREATININE 1.65 (H) 07/07/2020 1211   CREATININE 0.87 02/05/2018 1417   CALCIUM 9.7 07/07/2020 1211   CALCIUM 9.2 06/08/2014 1037   PROT 7.0 06/17/2020 0130   PROT 6.1 01/29/2018 1602   PROT 7.3 03/07/2014 1955   ALBUMIN 3.8 06/17/2020 0130   ALBUMIN 3.3 (L) 01/29/2018 1602   ALBUMIN 3.9 03/07/2014 1955   AST 81 (H) 06/17/2020 0130   AST 17 03/07/2014 1955   ALT 37 06/17/2020 0130   ALT 23 03/07/2014 1955   ALKPHOS 114 06/17/2020 0130   ALKPHOS 105 03/07/2014 1955   BILITOT 3.7 (H) 06/17/2020 0130   BILITOT 0.6 01/29/2018 1602   BILITOT 0.6 03/07/2014 1955   GFRNONAA 45 (L) 07/07/2020 1211   GFRNONAA >60 07/01/2020 0412   GFRNONAA >60 06/08/2014 1037   GFRNONAA >60 02/17/2014 0909   GFRAA 52 (L) 07/07/2020 1211   GFRAA >60 06/08/2014 1037   GFRAA >60 02/17/2014 [id number]  No results found.     Assessment & Plan:   1. Atherosclerosis of native arteries of left leg with ulceration of other part of foot ([place])  Recommend:  The patient has evidence of severe atherosclerotic  changes of both lower extremities associated with ulceration and tissue loss of the foot.  This represents a limb threatening ischemia and places the patient at the risk for limb loss.  Patient should undergo angiography of the lower extremities with the hope for intervention for limb salvage.  The risks and benefits as well as the alternative therapies was discussed in detail with the patient.  All questions were answered.  Patient agrees to proceed with angiography.  The patient will follow up with me in the office after the procedure.    2. Essential hypertension Continue antihypertensive medications as already ordered, these medications have been reviewed and there are no changes at this time.   3. Venous ulcer of left leg ([place]) Patient did have obvious ulceration and it appeared to be more of the skin today.  However we will place the patient in wraps in order to help the wound heal.  Patient will present to the office on a weekly basis if he does not have home health in place to have his wraps changed.  If the patient does indeed have home health we will have him return for evaluation in 4 weeks.  4. HFrEF (heart failure with reduced ejection fraction) ([place]) This may complicate the patient's lower extremity wound healing.  As discussed with the patient's wife sometimes with reduced ejection fraction, the issue is with the ability of the blood to reach lower extremities versus there being an obstruction.  In this instance peripheral vascular intervention may not yield a great difference.   Current Outpatient Medications on File Prior to Visit  Medication Sig Dispense Refill  . albuterol (VENTOLIN HFA) 108 (90 Base) MCG/ACT inhaler Inhale into the lungs every 6 (six) hours as needed for wheezing or shortness of breath.    [name] atorvastatin (LIPITOR) 80 MG tablet Take 80 mg by mouth daily.    . carvedilol (COREG) 3.125 MG tablet Take 1 tablet (3.125 mg total) by mouth 2 (two) times daily with a  meal. 60 tablet 1  . clopidogrel (PLAVIX) 75 MG tablet Take 75 mg by mouth daily.     . digoxin (LANOXIN) 0.125 MG tablet Take 1 tablet (0.125 mg total) by mouth daily. 30 tablet 0  . DULoxetine (CYMBALTA) 20 MG capsule Take 30 mg by mouth daily.     [name] ELIQUIS 5 MG TABS tablet Take 5 mg by mouth 2 (two) times daily.    . empagliflozin (JARDIANCE) 10 MG TABS tablet Take 10 mg by mouth daily.     [name] ezetimibe (ZETIA) 10 MG tablet Take 10 mg by mouth daily.    [name] gabapentin (NEURONTIN) 800 MG tablet Take 800 mg by mouth 4 (four) times daily.     . Ipratropium-Albuterol (COMBIVENT RESPIMAT) 20-100 MCG/ACT AERS respimat Inhale 1 puff into the lungs every 6 (six) hours as needed for wheezing.    [name] ipratropium-albuterol (DUONEB) 0.5-2.5 (3) MG/3ML SOLN Inhale 3 mLs into the lungs every 6 (six) hours as needed (wheezing/sob).     . isosorbide mononitrate (IMDUR) 30 MG 24 hr tablet Take 30 mg by mouth daily.    . Multiple Vitamin (MULTI-VITAMINS) TABS Take 1 tablet by mouth daily.     . nitroGLYCERIN (NITROSTAT) 0.4 MG SL tablet Place 1 tablet (0.4  mg total) under the tongue every 5 (five) minutes as needed for chest pain. 30 tablet 5  . omeprazole (PRILOSEC) 40 MG capsule Take 40 mg by mouth daily.    . Oxycodone HCl 10 MG TABS Take 10 mg by mouth every 6 (six) hours as needed (pain).     . potassium chloride (KLOR-CON) 10 MEQ tablet Take 10 mEq by mouth 2 (two) times daily.    . sodium chloride 1 g tablet Take 1 tablet (1 g total) by mouth 3 (three) times daily with meals. 90 tablet 3  . spironolactone (ALDACTONE) 25 MG tablet Take 0.5 tablets (12.5 mg total) by mouth daily. 30 tablet 1  . SYMBICORT 80-4.5 MCG/ACT inhaler Inhale 2 puffs into the lungs daily.     . tamsulosin (FLOMAX) 0.4 MG CAPS capsule Take 1 capsule (0.4 mg total) by mouth daily after supper. 30 capsule 1  . torsemide (DEMADEX) 20 MG tablet Take 2 tablets (40 mg total) by mouth daily. 120 tablet 4  . traZODone (DESYREL) 150 MG tablet  Take 150 mg by mouth at bedtime.    . Vericiguat 5 MG TABS Take 5 mg by mouth daily.     No current facility-administered medications on file prior to visit.    There are no Patient Instructions on file for this visit. No follow-ups on file.   Kris Hartmann, NP

## 2020-07-05 NOTE — Progress Notes (Signed)
Cardiology Office Note:    Date:  07/07/2020   ID:  [name] [name], DOB Dec 11, 1960, MRN [id number]  PCP:  [name], MD  [facility] Cardiologist:  Ida Rogue, MD  [facility] Electrophysiologist:  None   Referring MD: [name], MD   Chief Complaint: hospital follow-up  History of Present Illness:    [name] [name] is a 60 y.o. male with a hx of ICM EF 20-25%, CAD/PCI, tobacco abuse, COPD, DM2, PAD s/p L SFA stenting, s/p R BKA, afib on Eliquis, OSA noncompliant with CPAP, hs of leaving AMA and noncompliance who is being seen for hospital follow-up.   He had LAD and RCA interventions in 2013 and 2014. In 04/2017 he was found to have new cardiomyopathy with EF 25-30%. Cath showed severe multivessel CAD with CT referral. He sought a second opinion at [facility] and was followed by them briefly. Cardiac MRI in 08/2017 showed EF 19% with multiple areas of ischemia with recommendation to optimize medically prior to CABG. He subsequently denied CABG. Echos over the years have showed persistent cardiomyopathy.   The patient was hospitalized in August 2021 for A. fib found to have elevated troponin.  Echo showed EF less than 20%.  He was also hypotensive.  He was transferred to [facility] for cardiac catheterization but declined.  Left heart cath [facility] showed three-vessel disease, patent proximal LAD stent, moderate distal LAD disease, significant stenosis in the proximal left circumflex, bifurcation lesions, patent stent to the RCA, significant proximal disease in the mid to distal segment.  With felt cardiomyopathy was out of proportion to the CAD.  Atherectomy to the left circumflex was considered but patient was not optimized from a heart failure standpoint.  No PCI was performed and patient eventually left AMA.  Patient was admitted to [facility] 01/29/2020 for acute heart failure and left AMA on 8/25.  Eventually started taking Eliquis for anticoagulation and metoprolol for rate control.   Amiodarone was also started.  He has had frequent trips to same-day surgery for IV Lasix.  At home patient was taking torsemide 40 mg BID.  The patient was admitted 06/17/20 to 1/22 for biventricular heart failure, pulmonary HTN, AKI, and hypotension in which time cardiolgoy saw. Echo during the admission showed LVEF <20%, at 14%, global hypokinesis, severely reduced RV function, severely dilated L&R atrium, Right atrial pressure of 68mHG. Patient was diuresed nearly 20kg and transitioned to torsemide 40 mg daily. Verquzo resumed on d/c. Metolazone held for AKI. Also was on coreg, spiro, and Jardiance. No ACE/ARB started for relative hypotension and kidney function. Patient was recommended SNF for rehab but aptietn refused. He was sent home with home health service and OP palliative care.  Today, he reports he has been doing ok. He feels weak and it's been hard getting his strength back. He was sent home with home health and they visited yesterday. He is living with his girlfriend. He had some trouble with bleeding on his arms, however he was at vascular office that day, and they bandaged him up. He is taking Eliquis and plavix. Breathing has been good at home. No chest pain. He has not been taking weights at home. He tried to weigh himself but feels weak and loses his balance easily. His girlfriends tries to help him but is unable to. At discharge on 1/22 he was 173lbs. Today he weighs at 180lbs. He denies worsening swelling. He has been taking torsemide 40 mg daily. Breathing is not worse. No orthopnea  or pnd. No palpitations. No fevers or chills. Will check a Reds Vest.   Reds Vest 29%   Past Medical History:  Diagnosis Date  . Arthritis   . Asthma   . Atherosclerosis   . BPH (benign prostatic hyperplasia)   . Carotid arterial disease ([place])   . Charcot's joint of foot, right   . Chronic combined systolic (congestive) and diastolic (congestive) heart failure ([place])    a. 02/2013 EF 50% by LV gram;  b. 04/2017 Echo: EF 25-30%. diff HK. Gr2 DD. Mod dil LA/RV. PASP 40mHg.  .Marland KitchenCOPD (chronic obstructive pulmonary disease) ([place]   . Coronary artery disease    a. 2013 S/P PCI of LAD ([facility];  b. 03/2013 PCI: RCA 90p ( 2.5 x 23 mm DES); c. 02/2014 Cath: patent RCA stent->Med Rx; d. 04/2017 Cath: LM nl, LAd 30ost/p, 693m, D1 80ost, LCX 90p/m, OM1 85, RCA 70/20p, 8037m0d->Referred for CT Surg-felt to be poor candidate.  . Diabetic neuropathy ([place]CC[place] . Diabetic ulcer of right foot ([place]CC[place] . Hernia   . Hyperlipidemia   . Hypertension   . Ischemic cardiomyopathy    a. 04/2017 Echo: EF 25-30%; b. 08/2017 Cardiac MRI ([facility]): EF 19%, sev glob HK. RVEF 20%, mod BAE, triv MR, mild-mod TR. Basal lateral subendocardial infarct (viable), inf/infsept, dist septal ischemia, basal to mid lat peri-infarct ischemia.  . Morbid obesity ([place]CC[place] . Neuropathy   . PAD (peripheral artery disease) ([place]  a. Followed by Dr. DewLucky Cowboy. 08/2010 Periph Angio: RSFA 70-80p (6X60 self-expanding stent); c. 09/2010 Periph Angio: L SFA 100p (7x unknown length self-expanding stent); d. 06/2015 Periph Angio: R SFA short segment occlusion (6x12 self-expanding stent).  . Restless leg syndrome   . Sleep apnea   . Subclavian artery stenosis, right (HCCIvale[place] . Syncope and collapse   . Tobacco use    a. 75+ yr hx - still smoking 1ppd, down from 2 ppd.  . Type II diabetes mellitus (HCCW[place] . Varicose vein     Past Surgical History:  Procedure Laterality Date  . ABDOMINAL AORTAGRAM N/A 06/23/2013   Procedure: ABDOMINAL AORMaxcine HamSurgeon: MuhWellington HampshireD;  [facility];  Service: Cardiovascular;  Laterality: N/A;  . AMPUTATION Right 04/01/2018   Procedure: AMPUTATION BELOW KNEE;  Surgeon: DewAlgernon HuxleyD;  Location: [facility];  Service: Vascular;  Laterality: Right;  . APPENDECTOMY    . CARDIAC CATHETERIZATION  10/14   [facility]; X1 STENT PROXIMAL RCA  . CARDIAC CATHETERIZATION  02/2010   [facility]  . CARDIAC CATHETERIZATION   02/21/2014   [facility]  . CLAVICLE SURGERY    . HERNIA REPAIR    . IRRIGATION AND DEBRIDEMENT FOOT Right 01/04/2018   Procedure: IRRIGATION AND DEBRIDEMENT FOOT;  Surgeon: FowSamara DeistPM;  Location: [facility] OR[facility];  Service: Podiatry;  Laterality: Right;  . LEFT HEART CATH AND CORONARY ANGIOGRAPHY N/A 01/20/2020   Procedure: LEFT HEART CATH AND CORONARY ANGIOGRAPHY;  Surgeon: AriWellington HampshireD;  [facility];  Service: Cardiovascular;  Laterality: N/A;  . LEFT HEART CATH AND CORS/GRAFTS ANGIOGRAPHY N/A 04/28/2017   Procedure: LEFT HEART CATH AND CORONARY ANGIOGRAPHY;  Surgeon: AriWellington HampshireD;  [facility];  Service: Cardiovascular;  Laterality: N/A;  . LOWER EXTREMITY ANGIOGRAPHY Right 03/03/2018   Procedure: LOWER EXTREMITY ANGIOGRAPHY;  Surgeon: SchKatha CabalD;  [facility];  Service: Cardiovascular;  Laterality:  Right;  [name] LOWER EXTREMITY ANGIOGRAPHY Left 08/24/2018   Procedure: LOWER EXTREMITY ANGIOGRAPHY;  Surgeon: Algernon Huxley, MD;  Location: [place] [facility];  Service: Cardiovascular;  Laterality: Left;  . LOWER EXTREMITY ANGIOGRAPHY Left 06/14/2019   Procedure: LOWER EXTREMITY ANGIOGRAPHY;  Surgeon: Algernon Huxley, MD;  Location: [place] [facility];  Service: Cardiovascular;  Laterality: Left;  . LOWER EXTREMITY ANGIOGRAPHY Left 08/02/2019   Procedure: LOWER EXTREMITY ANGIOGRAPHY;  Surgeon: Algernon Huxley, MD;  Location: [place] [facility];  Service: Cardiovascular;  Laterality: Left;  . PERIPHERAL ARTERIAL STENT GRAFT     x2 left/right  . PERIPHERAL VASCULAR BALLOON ANGIOPLASTY Right 01/06/2018   Procedure: PERIPHERAL VASCULAR BALLOON ANGIOPLASTY;  Surgeon: Katha Cabal, MD;  Location: [place] [facility];  Service: Cardiovascular;  Laterality: Right;  . PERIPHERAL VASCULAR CATHETERIZATION N/A 07/06/2015   Procedure: Abdominal Aortogram w/Lower Extremity;  Surgeon: Algernon Huxley, MD;  Location: [place] [facility];   Service: Cardiovascular;  Laterality: N/A;  . PERIPHERAL VASCULAR CATHETERIZATION  07/06/2015   Procedure: Lower Extremity Intervention;  Surgeon: Algernon Huxley, MD;  Location: [place] [facility];  Service: Cardiovascular;;    Current Medications: Current Meds  Medication Sig  . albuterol (VENTOLIN HFA) 108 (90 Base) MCG/ACT inhaler Inhale into the lungs every 6 (six) hours as needed for wheezing or shortness of breath.  [name] atorvastatin (LIPITOR) 80 MG tablet Take 80 mg by mouth daily.  . carvedilol (COREG) 3.125 MG tablet Take 1 tablet (3.125 mg total) by mouth 2 (two) times daily with a meal.  . clopidogrel (PLAVIX) 75 MG tablet Take 75 mg by mouth daily.   . digoxin (LANOXIN) 0.125 MG tablet Take 1 tablet (0.125 mg total) by mouth daily.  . DULoxetine (CYMBALTA) 20 MG capsule Take 30 mg by mouth daily.   [name] ELIQUIS 5 MG TABS tablet Take 5 mg by mouth 2 (two) times daily.  . empagliflozin (JARDIANCE) 10 MG TABS tablet Take 10 mg by mouth daily.   [name] ezetimibe (ZETIA) 10 MG tablet Take 10 mg by mouth daily.  [name] gabapentin (NEURONTIN) 800 MG tablet Take 800 mg by mouth 4 (four) times daily.   . Ipratropium-Albuterol (COMBIVENT RESPIMAT) 20-100 MCG/ACT AERS respimat Inhale 1 puff into the lungs every 6 (six) hours as needed for wheezing.  [name] ipratropium-albuterol (DUONEB) 0.5-2.5 (3) MG/3ML SOLN Inhale 3 mLs into the lungs every 6 (six) hours as needed (wheezing/sob).   . isosorbide mononitrate (IMDUR) 30 MG 24 hr tablet Take 30 mg by mouth daily.  . Multiple Vitamin (MULTI-VITAMINS) TABS Take 1 tablet by mouth daily.   . nitroGLYCERIN (NITROSTAT) 0.4 MG SL tablet Place 1 tablet (0.4 mg total) under the tongue every 5 (five) minutes as needed for chest pain.  [name] omeprazole (PRILOSEC) 40 MG capsule Take 40 mg by mouth daily.  . Oxycodone HCl 10 MG TABS Take 10 mg by mouth every 6 (six) hours as needed (pain).   . potassium chloride (KLOR-CON) 10 MEQ tablet Take 10 mEq by mouth 2 (two) times daily.   . sodium chloride 1 g tablet Take 1 tablet (1 g total) by mouth 3 (three) times daily with meals.  [name] spironolactone (ALDACTONE) 25 MG tablet Take 0.5 tablets (12.5 mg total) by mouth daily.  . SYMBICORT 80-4.5 MCG/ACT inhaler Inhale 2 puffs into the lungs daily.   . tamsulosin (FLOMAX) 0.4 MG CAPS capsule Take 1 capsule (0.4 mg total) by mouth daily after supper.  . torsemide (  DEMADEX) 20 MG tablet Take 2 tablets (40 mg total) by mouth daily.  . traZODone (DESYREL) 150 MG tablet Take 150 mg by mouth at bedtime.  . Vericiguat 5 MG TABS Take 5 mg by mouth daily.     Allergies:   Dulaglutide, Other, and Prednisone   Social History   Socioeconomic History  . Marital status: Single    Spouse name: Not on file  . Number of children: 0  . Years of education: 79  . Highest education level: 12th grade  Occupational History  . Occupation: on disability  Tobacco Use  . Smoking status: Current Every Day Smoker    Packs/day: 1.00    Years: 41.00    Pack years: 41.00    Types: Cigarettes  . Smokeless tobacco: Never Used  Vaping Use  . Vaping Use: Never used  Substance and Sexual Activity  . Alcohol use: No  . Drug use: No  . Sexual activity: Yes  Other Topics Concern  . Not on file  Social History Narrative   Lives at home in [place] with girlfriend. Independent at baseline.   Social Determinants of Health   Financial Resource Strain: Not on file  Food Insecurity: Not on file  Transportation Needs: Not on file  Physical Activity: Not on file  Stress: Not on file  Social Connections: Not on file     Family History: The patient's family history includes Heart attack in his father; Heart disease in his father; Hyperlipidemia in his father and mother; Hypertension in his father and mother.  ROS:   Please see the history of present illness.     All other systems reviewed and are negative.  EKGs/Labs/Other Studies Reviewed:    The following studies were reviewed  today:  Echo 06/17/20 1. Left ventricular ejection fraction, by estimation, is <20%. Left  ventricular ejection fraction by PLAX is 14 %. The left ventricle has  severely decreased function. The left ventricle demonstrates global  hypokinesis. The left ventricular internal  cavity size was severely dilated. Left ventricular diastolic parameters  are indeterminate.  2. Right ventricular systolic function is severely reduced. The right  ventricular size is moderately enlarged. There is severely elevated  pulmonary artery systolic pressure. The estimated right ventricular  systolic pressure is [id number] mmHg.  3. Left atrial size was severely dilated.  4. Right atrial size was severely dilated.  5. Tricuspid valve regurgitation is mild to moderate.  6. The inferior vena cava is dilated in size with <50% respiratory  variability, suggesting right atrial pressure of 15 mmHg.   01/2020:  Prox RCA-1 lesion is 80% stenosed.  Prox RCA-2 lesion is 20% stenosed.  Prox RCA to Mid RCA lesion is 80% stenosed.  Prox LAD lesion is 20% stenosed.  1st Diag lesion is 80% stenosed.  Mid LAD lesion is 20% stenosed.  Dist LAD lesion is 60% stenosed.  Prox Cx to Mid Cx lesion is 90% stenosed.  LPAV lesion is 95% stenosed.  1. Significant underlying three-vessel coronary artery disease. Patent proximal LAD stent with mild in-stent restenosis. Moderate diffuse disease in the distal LAD. Significant stenosis in the proximal left circumflex at the origin of the posterior AV groove artery which is also heavily diseased at the ostium. This is a bifurcation lesion and heavily calcified. RCA stent is patent. However, there is significant proximal disease in the whole mid to distal segment is diffusely diseased and small caliber. 2. Left ventricular angiography was not performed. EF was severe reduced  by echo. 3. Moderately elevated left ventricular end-diastolic pressure at 28 mmHg  Recommendations: The  patient's cardiomyopathy seems to be out of proportion to his coronary artery disease as the LAD itself does not seem to have obstructive disease at the present time. The RCA is diffusely diseased and too small to stent. The only target for revascularization would be the left circumflex but it is a calcified bifurcation lesion and likely requires atherectomy. In order to do this safely, the patient has to be optimized from a heart failure standpoint as he appears to be volume overloaded. Recommend intravenous diuresis. If limited by hypotension, we might need to consider inotropic support and small dose norepinephrine drip initially. I added small dose digoxin. Treat underlying atrial fibrillation. Resume heparin drip 8 hours after sheath pull. __________  2D echo 01/2020: 1. Left ventricular ejection fraction, by estimation, is <20%. The left  ventricle has severely decreased function. The left ventricle demonstrates  global hypokinesis. The left ventricular internal cavity size was  moderately dilated. Left ventricular  diastolic parameters are indeterminate.  2. Right ventricular systolic function is moderately reduced. The right  ventricular size is moderately enlarged. There is normal pulmonary artery  systolic pressure. The estimated right ventricular systolic pressure is  0000000 mmHg.  3. Left atrial size was mildly dilated.  4. Mild mitral valve regurgitation.  5. Tricuspid valve regurgitation is moderate. __________  2D echo 09/2019: 1. Left ventricular ejection fraction, by estimation, is 20 to 25%. The  left ventricle has severely decreased function. The left ventricle  demonstrates global hypokinesis. The left ventricular internal cavity size  was severely dilated. There is mild  left ventricular hypertrophy. Indeterminate diastolic filling due to E-A  fusion.  2. Right ventricular systolic function is moderately reduced. The right  ventricular size is mildly enlarged.  There is moderately elevated  pulmonary artery systolic pressure. The estimated right ventricular  systolic pressure is 123456 mmHg.  3. Left atrial size was mildly dilated.  4. Right atrial size was moderately dilated.  5. The mitral valve is degenerative. Mild mitral valve regurgitation. No  evidence of mitral stenosis.  6. Tricuspid valve regurgitation is mild to moderate.  7. The aortic valve was not well visualized. Aortic valve regurgitation  is not visualized. Mild aortic valve sclerosis is present, with no  evidence of aortic valve stenosis.  8. The inferior vena cava is dilated in size with <50% respiratory  variability, suggesting right atrial pressure of 15 mmHg. __________  2D Echo 10/2018: 1. The left ventricle has severely reduced systolic function, with an ejection fraction of 20-25%. The cavity size was moderately dilated. Indeterminate diastolic filling due to E-A fusion. Left ventricular diffuse hypokinesis. 2. The right ventricle has severely reduced systolic function. The cavity was mildly enlarged. There is no increase in right ventricular wall thickness. Right ventricular systolic pressure is moderately elevated with an estimated pressure of 59.6 mmHg. 3. Left atrial size was mildly dilated. 4. Right atrial size was mildly dilated. 5. The mitral valve is degenerative. Mild thickening of the mitral valve leaflet. Mild calcification of the mitral valve leaflet. There is mild mitral annular calcification present. 6. The tricuspid valve is degenerative. Tricuspid valve regurgitation is moderate. 7. The aortic valve was not well visualized. Moderate thickening of the aortic valve. Moderate calcification of the aortic valve. 8. The inferior vena cava was dilated in size with <50% respiratory variability. __________  LHC 04/2017:  Prox RCA-1 lesion is 70% stenosed.  Prox RCA-2 lesion is  20% stenosed.  Mid RCA lesion is 80% stenosed.  Mid RCA to Dist RCA  lesion is 70% stenosed.  Ost LAD to Prox LAD lesion is 30% stenosed.  Ost 1st Diag to 1st Diag lesion is 80% stenosed.  Prox LAD to Mid LAD lesion is 60% stenosed.  Dist LAD lesion is 60% stenosed.  Prox Cx to Mid Cx lesion is 90% stenosed.  Ost 1st Mrg to 1st Mrg lesion is 85% stenosed.  1. Significant three-vessel coronary artery disease with patent stent in the RCA and LAD. There is significant proximal RCA disease before the stent as well as diffuse mid and distal disease in a vessel that appears to be about 2.5 mm in diameter. There is significant bifurcation stenosis in the proximal left circumflex with a large OM1. The LAD has moderate disease. The coronary arteries are moderately calcified and diffusely diseased throughout. 2. Left ventricular angiography was not performed. EF was moderately to severely reduced by echo. 3. Severely elevated left ventricular end-diastolic pressure at 34 mmHg.  Recommendations: This is overall a difficult situation. Revascularization options are somewhat limited due to diffuse disease overall. Given that he is diabetic and has cardiomyopathy, best option might be CABG if he is found to be a candidate. We need to optimize his heart failure first with outpatient referral to cardiothoracic surgery for evaluation.  I am going to increase intravenous diuresis today and the patient possibly can be discharged home tomorrow on medical therapy.   EKG:  EKG is  ordered today.  The ekg ordered today demonstrates Afib, HR 72 bpm, LAD, RBBB, LVH with repol abnormality  Recent Labs: 06/17/2020: ALT 37; B Natriuretic Peptide 1,925.5 07/01/2020: BUN 42; Creatinine, Ser 1.33; Hemoglobin 14.3; Magnesium 2.1; Platelets 187; Potassium 4.0; Sodium 138  Recent Lipid Panel    Component Value Date/Time   CHOL 118 01/20/2020 0352   CHOL 211 (H) 04/05/2013 0520   TRIG 37 01/20/2020 0352   TRIG 321 (H) 04/05/2013 0520   HDL 34 (L) 01/20/2020 0352   HDL 34  (L) 04/05/2013 0520   CHOLHDL 3.5 01/20/2020 0352   VLDL 7 01/20/2020 0352   VLDL 64 (H) 04/05/2013 0520   LDLCALC 77 01/20/2020 0352   LDLCALC 113 (H) 04/05/2013 0520     Risk Assessment/Calculations:    CHA2DS2-VASc Score = 4  This indicates a 4.8% annual risk of stroke. The patient's score is based upon: CHF History: Yes HTN History: Yes Diabetes History: Yes Stroke History: No Vascular Disease History: Yes Age Score: 0 Gender Score: 0      Physical Exam:    VS:  BP 112/64 (BP Location: Right Arm, Patient Position: Sitting, Cuff Size: Normal)   Pulse 72   Ht '5\' 8"'$  (1.727 m)   Wt 180 lb (81.6 kg)   SpO2 96%   BMI 27.37 kg/m     Wt Readings from Last 3 Encounters:  07/07/20 180 lb (81.6 kg)  07/05/20 178 lb (80.7 kg)  07/01/20 173 lb 6.4 oz (78.7 kg)     GEN:  Well nourished, well developed in no acute distress HEENT: Normal NECK: No JVD; No carotid bruits LYMPHATICS: No lymphadenopathy CARDIAC: Irreg Irreg, no murmurs, rubs, gallops RESPIRATORY:Diffusely diminished without rales, wheezing or rhonchi  ABDOMEN: Soft, non-tender, non-distended MUSCULOSKELETAL:  Mild lower leg left sided edema, ;R BKA  SKIN: Warm and dry NEUROLOGIC:  Alert and oriented x 3 PSYCHIATRIC:  Normal affect   ASSESSMENT:    1. Chronic systolic heart failure ([place])  2. Coronary artery disease of native artery of native heart with stable angina pectoris ([place])   3. Essential hypertension   4. Permanent atrial fibrillation ([place])   5. Tobacco use   6. AKI (acute kidney injury) ([place])   7. Mixed hyperlipidemia    PLAN:    In order of problems listed above:  BiV heart failure/Mixed ischemic and NICM EF <20% Recently admitted for acute heart failure and diuresed 40lbs. Echo during the admission showed LVEF <20%, at 14%, global hypokinesis, severely reduced RV function, severely dilated L&R atrium, Right atrial pressure of 40mHG. He was 173lbs on discharge and today is 180lbs. He is  unable to take a weight at home due to generalized weakness and feeling off balance. Recommended home health help weigh the patient. He denies chest pain, palpitations, abdominal fullness, sob, orthopnea, or worsening LLE. He has an UHaematologiston the left foot and some mild lower leg edema, but this is unchanged. Vascular will change change the UNNA boot next week. Lungs are clear and I do not appreciate JVD on exam. Reports he is taking his Torsemide 40 mg daily as well as other cardiac medications. RedsVest 29%. Despite weight gain RedsVest is normal. Suspect there is som inconsitensies with the scales. Will check BMET today since he had some AKI at discharge. Continue coreg, spironolactone, Jardiance. No ACE/ARB/Entresto given hypotension and kidney dysfunction. Continue current does of torsemide. Recommended weights as able.   Known severe CAD [facility] 2021 for NSTEMI showed severe multivessel CAD and atherectomy to the left circumflex was considered but the patient eventually left AMA. He denies anginal symptoms. No ASA with Eliquis. Continue lipitor/zetia, BB and Imdur.   Permanent Afib Ventricular rates well controlled. Continue coreg and digoxin. Dig level 1/18 was 0.6. CHADSVASC at least 4 (CHF, HTN, DM2, PAD). He reports compliance with Eliquis. I will check a digoxin level today.   AKI Some mild AKI at discharge and torsemide was decreased prior to discharge. BMET today  HTN He had some soft pressures during hospitalization. Today 112/64. Continue current medications.  HLD Most recent LDL 77. Continue Lipitor and zetia  Substance abuse He is still smoking 1 ppd.   Disposition He was made DNR during hospitalization and palliative care saw him. He was recommended SNF for rehab at d/c but he refused.  He was sent home with home health and will continue to see palliative care.   Disposition: Follow up in 3 month(s) with APP/MD   Shared  Decision Making/Informed Consent        Signed, Elliannah Wayment HNinfa Meeker PA-C  07/07/2020 1:39 PM    [facility]

## 2020-07-05 NOTE — H&P (View-Only) (Signed)
Subjective:    Patient ID: [name] [name], male    DOB: May 08, 1961, 60 y.o.   MRN: [id number] Chief Complaint  Patient presents with  . Follow-up    U/S will need unna boot    The patient returns to the office for followup and review of the noninvasive studies. There has been a significant deterioration in the lower extremity symptoms.  The patient notes interval shortening of their claudication distance and development of mild rest pain symptoms.  The patient was recently hospitalized for heart failure exacerbation for several weeks.  During that time it is believed that he received pressors.  The patient now has ulcerations on his lower extremity toes that were not present before.  There have been no significant changes to the patient's overall health care.  The patient denies amaurosis fugax or recent TIA symptoms. There are no recent neurological changes noted. The patient denies history of DVT, PE or superficial thrombophlebitis. The patient denies recent episodes of angina or shortness of breath.   ABI's Rt=n/a and Lt=0.73 (previous ABI's Rt=n/a and Lt=1.02) Duplex US of the lower extremity arterial system shows dampened monophasic tibial artery waveforms with dampened left toe waveforms.   Review of Systems  Cardiovascular: Positive for leg swelling.  Neurological: Positive for weakness.  Hematological: Bruises/bleeds easily.  All other systems reviewed and are negative.      Objective:   Physical Exam Vitals reviewed.  HENT:     Head: Normocephalic.  Cardiovascular:     Rate and Rhythm: Normal rate.     Pulses:          Dorsalis pedis pulses are 1+ on the left side.  Pulmonary:     Effort: Pulmonary effort is normal.  Musculoskeletal:     Left lower leg: Edema present.     Right Lower Extremity: Right leg is amputated below knee.  Feet:     Left foot:     Skin integrity: Ulcer present.  Skin:    Findings: Bruising present.  Neurological:     Mental Status:  He is alert and oriented to person, place, and time.  Psychiatric:        Mood and Affect: Mood normal.        Behavior: Behavior normal.        Thought Content: Thought content normal.        Judgment: Judgment normal.     BP 107/66   Pulse (!) 59   Ht '5\' 8"'$  (1.727 m)   Wt 178 lb (80.7 kg)   BMI 27.06 kg/m   Past Medical History:  Diagnosis Date  . Arthritis   . Asthma   . Atherosclerosis   . BPH (benign prostatic hyperplasia)   . Carotid arterial disease ([place])   . Charcot's joint of foot, right   . Chronic combined systolic (congestive) and diastolic (congestive) heart failure ([place])    a. 02/2013 EF 50% by LV gram; b. 04/2017 Echo: EF 25-30%. diff HK. Gr2 DD. Mod dil LA/RV. PASP 59mHg.  .Marland KitchenCOPD (chronic obstructive pulmonary disease) ([place]   . Coronary artery disease    a. 2013 S/P PCI of LAD ([facility];  b. 03/2013 PCI: RCA 90p ( 2.5 x 23 mm DES); c. 02/2014 Cath: patent RCA stent->Med Rx; d. 04/2017 Cath: LM nl, LAd 30ost/p, 618m, D1 80ost, LCX 90p/m, OM1 85, RCA 70/20p, 8026m0d->Referred for CT Surg-felt to be poor candidate.  . Diabetic neuropathy ([facility] . Diabetic ulcer of right  foot ([place])   . Hernia   . Hyperlipidemia   . Hypertension   . Ischemic cardiomyopathy    a. 04/2017 Echo: EF 25-30%; b. 08/2017 Cardiac MRI ([facility]): EF 19%, sev glob HK. RVEF 20%, mod BAE, triv MR, mild-mod TR. Basal lateral subendocardial infarct (viable), inf/infsept, dist septal ischemia, basal to mid lat peri-infarct ischemia.  . Morbid obesity ([place])   . Neuropathy   . PAD (peripheral artery disease) ([place])    a. Followed by Dr. Lucky Cowboy; b. 08/2010 Periph Angio: RSFA 70-80p (6X60 self-expanding stent); c. 09/2010 Periph Angio: L SFA 100p (7x unknown length self-expanding stent); d. 06/2015 Periph Angio: R SFA short segment occlusion (6x12 self-expanding stent).  . Restless leg syndrome   . Sleep apnea   . Subclavian artery stenosis, right ([place])   . Syncope and collapse   . Tobacco use    a. 75+ yr hx  - still smoking 1ppd, down from 2 ppd.  . Type II diabetes mellitus ([place])   . Varicose vein     Social History   Socioeconomic History  . Marital status: Single    Spouse name: Not on file  . Number of children: 0  . Years of education: 104  . Highest education level: 12th grade  Occupational History  . Occupation: on disability  Tobacco Use  . Smoking status: Current Every Day Smoker    Packs/day: 1.00    Years: 41.00    Pack years: 41.00    Types: Cigarettes  . Smokeless tobacco: Never Used  Vaping Use  . Vaping Use: Never used  Substance and Sexual Activity  . Alcohol use: No  . Drug use: No  . Sexual activity: Yes  Other Topics Concern  . Not on file  Social History Narrative   Lives at home in [place] with girlfriend. Independent at baseline.   Social Determinants of Health   Financial Resource Strain: Not on file  Food Insecurity: Not on file  Transportation Needs: Not on file  Physical Activity: Not on file  Stress: Not on file  Social Connections: Not on file  Intimate Partner Violence: Not on file    Past Surgical History:  Procedure Laterality Date  . ABDOMINAL AORTAGRAM N/A 06/23/2013   Procedure: ABDOMINAL Maxcine Ham;  Surgeon: Wellington Hampshire, MD;  Location: [place] CATH LAB;  Service: Cardiovascular;  Laterality: N/A;  . AMPUTATION Right 04/01/2018   Procedure: AMPUTATION BELOW KNEE;  Surgeon: Algernon Huxley, MD;  Location: [facility] ORS;  Service: Vascular;  Laterality: Right;  . APPENDECTOMY    . CARDIAC CATHETERIZATION  10/14   [facility]; X1 STENT PROXIMAL RCA  . CARDIAC CATHETERIZATION  02/2010   [facility]  . CARDIAC CATHETERIZATION  02/21/2014   [facility]  . CLAVICLE SURGERY    . HERNIA REPAIR    . IRRIGATION AND DEBRIDEMENT FOOT Right 01/04/2018   Procedure: IRRIGATION AND DEBRIDEMENT FOOT;  Surgeon: Samara Deist, DPM;  Location: [facility] ORS;  Service: Podiatry;  Laterality: Right;  . LEFT HEART CATH AND CORONARY ANGIOGRAPHY N/A 01/20/2020   Procedure: LEFT HEART  CATH AND CORONARY ANGIOGRAPHY;  Surgeon: Wellington Hampshire, MD;  Location: [place] CV LAB;  Service: Cardiovascular;  Laterality: N/A;  . LEFT HEART CATH AND CORS/GRAFTS ANGIOGRAPHY N/A 04/28/2017   Procedure: LEFT HEART CATH AND CORONARY ANGIOGRAPHY;  Surgeon: Wellington Hampshire, MD;  Location: [place] CV LAB;  Service: Cardiovascular;  Laterality: N/A;  . LOWER EXTREMITY ANGIOGRAPHY Right 03/03/2018   Procedure: LOWER EXTREMITY ANGIOGRAPHY;  Surgeon: Katha Cabal, MD;  Location: [place] CV LAB;  Service: Cardiovascular;  Laterality: Right;  . LOWER EXTREMITY ANGIOGRAPHY Left 08/24/2018   Procedure: LOWER EXTREMITY ANGIOGRAPHY;  Surgeon: Algernon Huxley, MD;  Location: [place] CV LAB;  Service: Cardiovascular;  Laterality: Left;  . LOWER EXTREMITY ANGIOGRAPHY Left 06/14/2019   Procedure: LOWER EXTREMITY ANGIOGRAPHY;  Surgeon: Algernon Huxley, MD;  Location: [place] CV LAB;  Service: Cardiovascular;  Laterality: Left;  . LOWER EXTREMITY ANGIOGRAPHY Left 08/02/2019   Procedure: LOWER EXTREMITY ANGIOGRAPHY;  Surgeon: Algernon Huxley, MD;  Location: [place] CV LAB;  Service: Cardiovascular;  Laterality: Left;  . PERIPHERAL ARTERIAL STENT GRAFT     x2 left/right  . PERIPHERAL VASCULAR BALLOON ANGIOPLASTY Right 01/06/2018   Procedure: PERIPHERAL VASCULAR BALLOON ANGIOPLASTY;  Surgeon: Katha Cabal, MD;  Location: [place] CV LAB;  Service: Cardiovascular;  Laterality: Right;  . PERIPHERAL VASCULAR CATHETERIZATION N/A 07/06/2015   Procedure: Abdominal Aortogram w/Lower Extremity;  Surgeon: Algernon Huxley, MD;  Location: [place] CV LAB;  Service: Cardiovascular;  Laterality: N/A;  . PERIPHERAL VASCULAR CATHETERIZATION  07/06/2015   Procedure: Lower Extremity Intervention;  Surgeon: Algernon Huxley, MD;  Location: [place] CV LAB;  Service: Cardiovascular;;    Family History  Problem Relation Age of Onset  . Heart attack Father   . Heart disease Father   .  Hypertension Father   . Hyperlipidemia Father   . Hypertension Mother   . Hyperlipidemia Mother     Allergies  Allergen Reactions  . Dulaglutide Anaphylaxis, Diarrhea and Hives  . Other Itching    Skin itching associated with nitro patch  . Prednisone Rash    CBC Latest Ref Rng & Units 07/01/2020 06/30/2020 06/29/2020  WBC 4.0 - 10.5 K/uL 12.3(H) 11.9(H) 10.9(H)  Hemoglobin 13.0 - 17.0 g/dL 14.3 13.5 12.7(L)  Hematocrit 39.0 - 52.0 % 42.9 39.3 39.1  Platelets 150 - 400 K/uL 187 158 159      CMP     Component Value Date/Time   NA 135 07/07/2020 1211   NA 136 06/08/2014 1037   K 3.8 07/07/2020 1211   K 4.3 06/08/2014 1037   CL 86 (L) 07/07/2020 1211   CL 101 06/08/2014 1037   CO2 32 (H) 07/07/2020 1211   CO2 27 06/08/2014 1037   GLUCOSE 278 (H) 07/07/2020 1211   GLUCOSE 133 (H) 07/01/2020 0412   GLUCOSE 415 (H) 06/08/2014 1037   BUN 42 (H) 07/07/2020 1211   BUN 9 06/08/2014 1037   CREATININE 1.65 (H) 07/07/2020 1211   CREATININE 0.87 02/05/2018 1417   CALCIUM 9.7 07/07/2020 1211   CALCIUM 9.2 06/08/2014 1037   PROT 7.0 06/17/2020 0130   PROT 6.1 01/29/2018 1602   PROT 7.3 03/07/2014 1955   ALBUMIN 3.8 06/17/2020 0130   ALBUMIN 3.3 (L) 01/29/2018 1602   ALBUMIN 3.9 03/07/2014 1955   AST 81 (H) 06/17/2020 0130   AST 17 03/07/2014 1955   ALT 37 06/17/2020 0130   ALT 23 03/07/2014 1955   ALKPHOS 114 06/17/2020 0130   ALKPHOS 105 03/07/2014 1955   BILITOT 3.7 (H) 06/17/2020 0130   BILITOT 0.6 01/29/2018 1602   BILITOT 0.6 03/07/2014 1955   GFRNONAA 45 (L) 07/07/2020 1211   GFRNONAA >60 07/01/2020 0412   GFRNONAA >60 06/08/2014 1037   GFRNONAA >60 02/17/2014 0909   GFRAA 52 (L) 07/07/2020 1211   GFRAA >60 06/08/2014 1037   GFRAA >60 02/17/2014 [id number]  No results found.     Assessment & Plan:   1. Atherosclerosis of native arteries of left leg with ulceration of other part of foot ([place])  Recommend:  The patient has evidence of severe atherosclerotic  changes of both lower extremities associated with ulceration and tissue loss of the foot.  This represents a limb threatening ischemia and places the patient at the risk for limb loss.  Patient should undergo angiography of the lower extremities with the hope for intervention for limb salvage.  The risks and benefits as well as the alternative therapies was discussed in detail with the patient.  All questions were answered.  Patient agrees to proceed with angiography.  The patient will follow up with me in the office after the procedure.    2. Essential hypertension Continue antihypertensive medications as already ordered, these medications have been reviewed and there are no changes at this time.   3. Venous ulcer of left leg ([place]) Patient did have obvious ulceration and it appeared to be more of the skin today.  However we will place the patient in wraps in order to help the wound heal.  Patient will present to the office on a weekly basis if he does not have home health in place to have his wraps changed.  If the patient does indeed have home health we will have him return for evaluation in 4 weeks.  4. HFrEF (heart failure with reduced ejection fraction) ([place]) This may complicate the patient's lower extremity wound healing.  As discussed with the patient's wife sometimes with reduced ejection fraction, the issue is with the ability of the blood to reach lower extremities versus there being an obstruction.  In this instance peripheral vascular intervention may not yield a great difference.   Current Outpatient Medications on File Prior to Visit  Medication Sig Dispense Refill  . albuterol (VENTOLIN HFA) 108 (90 Base) MCG/ACT inhaler Inhale into the lungs every 6 (six) hours as needed for wheezing or shortness of breath.    [name] atorvastatin (LIPITOR) 80 MG tablet Take 80 mg by mouth daily.    . carvedilol (COREG) 3.125 MG tablet Take 1 tablet (3.125 mg total) by mouth 2 (two) times daily with a  meal. 60 tablet 1  . clopidogrel (PLAVIX) 75 MG tablet Take 75 mg by mouth daily.     . digoxin (LANOXIN) 0.125 MG tablet Take 1 tablet (0.125 mg total) by mouth daily. 30 tablet 0  . DULoxetine (CYMBALTA) 20 MG capsule Take 30 mg by mouth daily.     [name] ELIQUIS 5 MG TABS tablet Take 5 mg by mouth 2 (two) times daily.    . empagliflozin (JARDIANCE) 10 MG TABS tablet Take 10 mg by mouth daily.     [name] ezetimibe (ZETIA) 10 MG tablet Take 10 mg by mouth daily.    [name] gabapentin (NEURONTIN) 800 MG tablet Take 800 mg by mouth 4 (four) times daily.     . Ipratropium-Albuterol (COMBIVENT RESPIMAT) 20-100 MCG/ACT AERS respimat Inhale 1 puff into the lungs every 6 (six) hours as needed for wheezing.    [name] ipratropium-albuterol (DUONEB) 0.5-2.5 (3) MG/3ML SOLN Inhale 3 mLs into the lungs every 6 (six) hours as needed (wheezing/sob).     . isosorbide mononitrate (IMDUR) 30 MG 24 hr tablet Take 30 mg by mouth daily.    . Multiple Vitamin (MULTI-VITAMINS) TABS Take 1 tablet by mouth daily.     . nitroGLYCERIN (NITROSTAT) 0.4 MG SL tablet Place 1 tablet (0.4  mg total) under the tongue every 5 (five) minutes as needed for chest pain. 30 tablet 5  . omeprazole (PRILOSEC) 40 MG capsule Take 40 mg by mouth daily.    . Oxycodone HCl 10 MG TABS Take 10 mg by mouth every 6 (six) hours as needed (pain).     . potassium chloride (KLOR-CON) 10 MEQ tablet Take 10 mEq by mouth 2 (two) times daily.    . sodium chloride 1 g tablet Take 1 tablet (1 g total) by mouth 3 (three) times daily with meals. 90 tablet 3  . spironolactone (ALDACTONE) 25 MG tablet Take 0.5 tablets (12.5 mg total) by mouth daily. 30 tablet 1  . SYMBICORT 80-4.5 MCG/ACT inhaler Inhale 2 puffs into the lungs daily.     . tamsulosin (FLOMAX) 0.4 MG CAPS capsule Take 1 capsule (0.4 mg total) by mouth daily after supper. 30 capsule 1  . torsemide (DEMADEX) 20 MG tablet Take 2 tablets (40 mg total) by mouth daily. 120 tablet 4  . traZODone (DESYREL) 150 MG tablet  Take 150 mg by mouth at bedtime.    . Vericiguat 5 MG TABS Take 5 mg by mouth daily.     No current facility-administered medications on file prior to visit.    There are no Patient Instructions on file for this visit. No follow-ups on file.   Kris [place]mann, NP

## 2020-07-06 ENCOUNTER — Other Ambulatory Visit: Payer: Medicare Other | Admitting: Primary Care

## 2020-07-06 ENCOUNTER — Telehealth (INDEPENDENT_AMBULATORY_CARE_PROVIDER_SITE_OTHER): Payer: Self-pay

## 2020-07-06 DIAGNOSIS — J441 Chronic obstructive pulmonary disease with (acute) exacerbation: Secondary | ICD-10-CM

## 2020-07-06 DIAGNOSIS — Z515 Encounter for palliative care: Secondary | ICD-10-CM

## 2020-07-06 DIAGNOSIS — L97929 Non-pressure chronic ulcer of unspecified part of left lower leg with unspecified severity: Secondary | ICD-10-CM

## 2020-07-06 DIAGNOSIS — I872 Venous insufficiency (chronic) (peripheral): Secondary | ICD-10-CM

## 2020-07-06 DIAGNOSIS — I25118 Atherosclerotic heart disease of native coronary artery with other forms of angina pectoris: Secondary | ICD-10-CM

## 2020-07-06 DIAGNOSIS — I83029 Varicose veins of left lower extremity with ulcer of unspecified site: Secondary | ICD-10-CM

## 2020-07-06 DIAGNOSIS — I5020 Unspecified systolic (congestive) heart failure: Secondary | ICD-10-CM

## 2020-07-06 NOTE — Progress Notes (Signed)
[facility] Palliative Care Consult Note Telephone: [phone]  Fax: [phone]     Date of encounter: 07/06/20 PATIENT NAME: [name] [name] [place] [id number] [phone] (home)  DOB: 01-23-1961 MRN: [id number]  PRIMARY CARE PROVIDER:    [name], MD,  [place] [address] [phone]  REFERRING PROVIDER:   [name], MD [place],  [place] [address] [phone]  RESPONSIBLE PARTY:   Extended Emergency Contact Information Primary Emergency Contact: [name] [name] Address: [address]          [place], [place] [address] Johnnette Litter of [place] Phone: [phone] Mobile Phone: [phone] Relation: Significant other  I met face to face with patient and family in the  home. Palliative Care was asked to follow this patient by consultation request of [name] Jacobs, MD to help address advance care planning and goals of care. This is a follow up  visit.   ASSESSMENT AND RECOMMENDATIONS:   1. Advance Care Planning/Goals of Care: Goals include to maximize quality of life and symptom management. Our advance care planning conversation included a discussion about:     The value and importance of advance care planning   Exploration of personal, cultural or spiritual beliefs that might influence medical decisions .  Exploration of goals of care in the event of a sudden injury or illness   Identification and preparation of a healthcare agent - [name] [name], SO  Creation of an  advance directive document . I met with patient and his significant other in their home. He is recently discharged after a serious hospitalization. They were very disturbed that he had been made a DNR as today he stated this was not his advance directive. We discussed the importance of doing advanced directives, which we had talked about on our last visit but he had chosen not to  complete. Today he will complete his most form and I have uploaded it to vynca. He has asked for full code, full scope, use of antibiotics but determine use of IV and feeding tube. He stated when if he did not have any hope for improvement and survival he would not want to be prolonged by artificial feeding.   We discussed the value of having an advance directive and that now his providers would be able to see his choice. He also has made [name] [name], his significant other, his power of attorney and that paperwork has also been completed.    I spent 20 minutes providing this consultation,  from 1600 to 1620. More than 50% of the time in this consultation was spent in counseling and care coordination.  _____________________________________________________________________________   2. Symptom Management:   COPD: We reviewed his hospital stay and specifically his respiratory status. He was smoking when I arrived. He does continue to smoke. He has old inhalers  and is not sure the ones to take. He came home within incruse. After reviewing his med list it  appears he is to be on Symbicort. I called in refills and I also asked for a rescue albuterol inhaler, as this had gone out of date.    We discussed that he does not remember if he has a  pulmonology provider and is not followed by pulmonology currently. I would recommend pulmonology follow for optimal COPD management.   Blood glucose and diabetic management. He has been on Metforman but this has been stopped. We discussed  the reasoning. He  has a fair amount of he has good control currently of his glucose. His diet is fairly heart healthy he's not a big sweet eater. He does eat a lot of preservatives which may impact his fluid status.   CHF: We discussed his recent electrolyte in balance and fluid overload and discussed  his new medications for that, including digoxin. Recent hospital stay for hyponatremia and complications. Reviewed medications and agree  with treatment regimen.  Pain Controlled with oxycodone, recommended acetaminophen as needed. This is prescribed by his PCP.  Wounds: Has skin tears on UE and LLE. These are worse. States was not able to get home health to come out to care for wounds. Going to wound center but would like home health due to difficulty in going out. Our office will see if we can find a home health provider who can take on his case.  3. Follow up Palliative Care Visit: Palliative care will continue to follow for goals of care clarification and symptom management. Return 4 weeks or prn.  4. Family /Caregiver/Community Supports: Lives with SO and dogs. No children.  5. Cognitive / Functional decline:  A and O x 3, dependent in most adls and iadls. R amputee and L LE with wounds.  CODE STATUS:FULL CODE  PPS: 40%  HOSPICE ELIGIBILITY/DIAGNOSIS: TBD  Subjective:  CHIEF COMPLAINT: debility, Dyspnea  HISTORY OF PRESENT ILLNESS:  [name] [name] is a 60 y.o. year old male  with CHF, DM, COPD, LE wounds and neuropathy, tobacco use disorder. Presents today for follow up after hospital stay and debility.  Dyspnea is worse with exertion and his movement is very limited. He has not been taking his maintenance inhaler regimen and is SOB frequently. He does not have a rescue inhaler as they went out of date and no refills. No exertion helps but also limits his ability for adls. Albuterol does help. Pt is also daily smoker which worsens dyspnea.  We are asked to consult around advance care planning and complex medical decision making.    Review and summarization of old Epic records shows or history from other than patient. Review or lab tests, radiology,  or medicine Labs for A1C, recent Na of 138, elevated BUN and creatine (42, 1.33) Review of case with family member [name] [name], provides history  History obtained from review of EMR, discussion with primary team, and  interview with family, caregiver  and/or [name] [name]. Records  reviewed and summarized above.   CURRENT PROBLEM LIST:  Patient Active Problem List   Diagnosis Date Noted  . Palliative care by specialist   . Acute on chronic heart failure ([place])   . CHF (congestive heart failure) ([place]) 06/18/2020  . CHF exacerbation ([place]) 06/17/2020  . Chronic anticoagulation 06/17/2020  . Chronic venous insufficiency 02/10/2020  . HFrEF (heart failure with reduced ejection fraction) ([place]) 01/30/2020  . COPD with acute exacerbation ([place]) 01/30/2020  . Persistent atrial fibrillation ([place]) 01/19/2020  . Chronic bilateral low back pain with sciatica 10/08/2019  . Medication management contract signed 10/08/2019  . Cellulitis of left lower leg 09/27/2019  . Cellulitis of foot, left 09/27/2019  . Chronic hyponatremia 09/27/2019  . Cellulitis of left leg 08/31/2019  . CAD (coronary artery disease) 08/31/2019  . Acute on chronic systolic CHF (congestive heart failure) ([place]) 11/05/2018  . Open toe wound 10/01/2018  . Venous ulcer of left leg ([place]) 08/19/2018  . 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[place]) 04/28/2018  . Sepsis ([place]) 03/30/2018  .  Abnormal MRI, shoulder (Right) 02/23/2018  . Abnormal MRI, cervical spine (2016) 02/23/2018  . DDD (degenerative disc disease), cervical 02/23/2018  . Cervical foraminal stenosis (Bilateral) 02/23/2018  . Cervical central spinal stenosis 02/23/2018  . Cervical facet hypertrophy 02/23/2018  . Arthralgia of acromioclavicular joint (Right) 02/23/2018  . Osteoarthritis of  AC (acromioclavicular) joint (Right) 02/23/2018  . Biceps tendinosis of shoulder (Right) 02/23/2018  . Tendinopathy of rotator cuff (Right) 02/23/2018  . Vitamin D deficiency 02/23/2018  . Atherosclerosis of native arteries of the extremities with ulceration ([place]) 02/15/2018  . Peripheral edema 02/05/2018  . Status post peripherally inserted central catheter (PICC) central line placement 02/04/2018  . Chronic lower extremity pain (Primary Area of  Pain) (Right) 01/29/2018  . Chronic shoulder pain [facility] Area of Pain) (Right) 01/29/2018  . Chronic foot pain (Secondary Area of Pain) (Right) 01/29/2018  . Chronic pain syndrome 01/29/2018  . Long term current use of opiate analgesic 01/29/2018  . Medication monitoring encounter 01/29/2018  . Disorder of skeletal system 01/29/2018  . Problems influencing health status 01/29/2018  . AKI (acute kidney injury) ([place]) 01/03/2018  . NSTEMI (non-ST elevated myocardial infarction) ([place]) 11/21/2017  . Coronary artery disease of native artery of native heart with stable angina pectoris ([place]) 09/10/2017  . HLD (hyperlipidemia) 09/10/2017  . Angina pectoris ([place]) 09/08/2017  . Angina at rest [facility]) 08/13/2017  . Lymphedema 05/02/2017  . Chronic systolic CHF (congestive heart failure) ([place]) 04/23/2017  . Chest pain 07/11/2016  . Unstable angina ([place]) 07/11/2016  . Foot ulcer (Right)   . Stable angina (HCC)   . Pressure injury of skin 05/06/2016  . Diabetic foot infection ([place]) 05/05/2016  . Type 2 diabetes mellitus with other specified complication ([place]) 72/53/6644  . Cellulitis 11/21/2015  . Morbid obesity ([place]) 11/17/2014  . PAD (peripheral artery disease) ([place])   . Hypertension   . Tobacco use   . COPD (chronic obstructive pulmonary disease) ([place]) 05/19/2012  . Diabetic neuropathy ([place]) 05/19/2012  . Peripheral vascular disease ([place]) 03/27/2012   PAST MEDICAL HISTORY:  Active Ambulatory Problems    Diagnosis Date Noted  . PAD (peripheral artery disease) ([place])   . Coronary artery disease of native artery of native heart with stable angina pectoris ([place]) 09/10/2017  . HLD (hyperlipidemia) 09/10/2017  . Hypertension   . Tobacco use   . Morbid obesity ([place]) 11/17/2014  . Cellulitis 11/21/2015  . Diabetic foot infection ([place]) 05/05/2016  . Pressure injury of skin 05/06/2016  . Chest pain 07/11/2016  . Unstable angina ([place]) 07/11/2016  . Foot ulcer (Right)   . Stable angina (HCC)    . Chronic systolic CHF (congestive heart failure) ([place]) 04/23/2017  . Type 2 diabetes mellitus with other specified complication ([place]) 03/47/4259  . Lymphedema 05/02/2017  . NSTEMI (non-ST elevated myocardial infarction) ([place]) 11/21/2017  . AKI (acute kidney injury) ([place]) 01/03/2018  . COPD (chronic obstructive pulmonary disease) ([place]) 05/19/2012  . Diabetic neuropathy ([place]) 05/19/2012  . Angina at rest [facility]) 08/13/2017  . Angina pectoris ([place]) 09/08/2017  . Chronic lower extremity pain (Primary Area of Pain) (Right) 01/29/2018  . Chronic shoulder pain [facility] Area of Pain) (Right) 01/29/2018  . Chronic foot pain (Secondary Area of Pain) (Right) 01/29/2018  . Chronic pain syndrome 01/29/2018  . Long term current use of opiate analgesic 01/29/2018  . Medication monitoring encounter 01/29/2018  . Disorder of skeletal system 01/29/2018  . Problems influencing health status 01/29/2018  . Status post peripherally inserted central catheter (PICC) central  line placement 02/04/2018  . Peripheral edema 02/05/2018  . Atherosclerosis of native arteries of the extremities with ulceration ([place]) 02/15/2018  . Abnormal MRI, shoulder (Right) 02/23/2018  . Abnormal MRI, cervical spine (2016) 02/23/2018  . DDD (degenerative disc disease), cervical 02/23/2018  . Cervical foraminal stenosis (Bilateral) 02/23/2018  . Cervical central spinal stenosis 02/23/2018  . Cervical facet hypertrophy 02/23/2018  . Arthralgia of acromioclavicular joint (Right) 02/23/2018  . Osteoarthritis of  AC (acromioclavicular) joint (Right) 02/23/2018  . Biceps tendinosis of shoulder (Right) 02/23/2018  . Tendinopathy of rotator cuff (Right) 02/23/2018  . Vitamin D deficiency 02/23/2018  . Sepsis ([place]) 03/30/2018  . 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[place]) 04/28/2018  . Venous ulcer of left leg ([place]) 08/19/2018  . Open toe wound 10/01/2018  . Acute on chronic systolic CHF (congestive heart failure) ([place])  11/05/2018  . Cellulitis of left leg 08/31/2019  . CAD (coronary artery disease) 08/31/2019  . Cellulitis of left lower leg 09/27/2019  . Cellulitis of foot, left 09/27/2019  . Chronic hyponatremia 09/27/2019  . Chronic bilateral low back pain with sciatica 10/08/2019  . Persistent atrial fibrillation ([place]) 01/19/2020  . Chronic venous insufficiency 02/10/2020  . HFrEF (heart failure with reduced ejection fraction) ([place]) 01/30/2020  . COPD with acute exacerbation ([place]) 01/30/2020  . Peripheral vascular disease ([place]) 03/27/2012  . Medication management contract signed 10/08/2019  . CHF exacerbation ([place]) 06/17/2020  . Chronic anticoagulation 06/17/2020  . CHF (congestive heart failure) ([place]) 06/18/2020  . Acute on chronic heart failure ([place])   . Palliative care by specialist    Resolved Ambulatory Problems    Diagnosis Date Noted  . Hypoxia   . Shortness of breath   . Sepsis ([place]) 12/22/2017   Past Medical History:  Diagnosis Date  . Arthritis   . Asthma   . Atherosclerosis   . BPH (benign prostatic hyperplasia)   . Carotid arterial disease ([place])   . Charcot's joint of foot, right   . Chronic combined systolic (congestive) and diastolic (congestive) heart failure ([place])   . Coronary artery disease   . Diabetic ulcer of right foot ([place])   . Hernia   . Hyperlipidemia   . Ischemic cardiomyopathy   . Neuropathy   . Restless leg syndrome   . Sleep apnea   . Subclavian artery stenosis, right ([place])   . Syncope and collapse   . Type II diabetes mellitus ([place])   . Varicose vein    SOCIAL HX:  Social History   Tobacco Use  . Smoking status: Current Every Day Smoker    Packs/day: 1.00    Years: 41.00    Pack years: 41.00    Types: Cigarettes  . Smokeless tobacco: Never Used  Substance Use Topics  . Alcohol use: No   FAMILY HX:  Family History  Problem Relation Age of Onset  . Heart attack Father   . Heart disease Father   . Hypertension Father   . Hyperlipidemia  Father   . Hypertension Mother   . Hyperlipidemia Mother       ALLERGIES:  Allergies  Allergen Reactions  . Dulaglutide Anaphylaxis, Diarrhea and Hives  . Other Itching    Skin itching associated with nitro patch  . Prednisone Rash     PERTINENT MEDICATIONS:  Outpatient Encounter Medications as of 07/06/2020  Medication Sig  . albuterol (VENTOLIN HFA) 108 (90 Base) MCG/ACT inhaler Inhale into the lungs every 6 (six) hours as needed for wheezing or shortness of  breath.  [name] atorvastatin (LIPITOR) 80 MG tablet Take 80 mg by mouth daily.  . carvedilol (COREG) 3.125 MG tablet Take 1 tablet (3.125 mg total) by mouth 2 (two) times daily with a meal.  . clopidogrel (PLAVIX) 75 MG tablet Take 75 mg by mouth daily.   . digoxin (LANOXIN) 0.125 MG tablet Take 1 tablet (0.125 mg total) by mouth daily.  . DULoxetine (CYMBALTA) 20 MG capsule Take 30 mg by mouth daily.   [name] ELIQUIS 5 MG TABS tablet Take 5 mg by mouth 2 (two) times daily.  . empagliflozin (JARDIANCE) 10 MG TABS tablet Take 10 mg by mouth daily.   [name] ezetimibe (ZETIA) 10 MG tablet Take 10 mg by mouth daily.  [name] gabapentin (NEURONTIN) 800 MG tablet Take 800 mg by mouth 4 (four) times daily.   . Ipratropium-Albuterol (COMBIVENT RESPIMAT) 20-100 MCG/ACT AERS respimat Inhale 1 puff into the lungs every 6 (six) hours as needed for wheezing.  [name] ipratropium-albuterol (DUONEB) 0.5-2.5 (3) MG/3ML SOLN Inhale 3 mLs into the lungs every 6 (six) hours as needed (wheezing/sob).   . isosorbide mononitrate (IMDUR) 30 MG 24 hr tablet Take 30 mg by mouth daily.  . Multiple Vitamin (MULTI-VITAMINS) TABS Take 1 tablet by mouth daily.   . nitroGLYCERIN (NITROSTAT) 0.4 MG SL tablet Place 1 tablet (0.4 mg total) under the tongue every 5 (five) minutes as needed for chest pain.  [name] omeprazole (PRILOSEC) 40 MG capsule Take 40 mg by mouth daily.  . Oxycodone HCl 10 MG TABS Take 10 mg by mouth every 6 (six) hours as needed (pain).   . potassium chloride (KLOR-CON)  10 MEQ tablet Take 10 mEq by mouth 2 (two) times daily.  . sodium chloride 1 g tablet Take 1 tablet (1 g total) by mouth 3 (three) times daily with meals.  [name] spironolactone (ALDACTONE) 25 MG tablet Take 0.5 tablets (12.5 mg total) by mouth daily.  . SYMBICORT 80-4.5 MCG/ACT inhaler Inhale 2 puffs into the lungs daily.   . tamsulosin (FLOMAX) 0.4 MG CAPS capsule Take 1 capsule (0.4 mg total) by mouth daily after supper.  . torsemide (DEMADEX) 20 MG tablet Take 2 tablets (40 mg total) by mouth daily.  . traZODone (DESYREL) 150 MG tablet Take 150 mg by mouth at bedtime.  . Vericiguat 5 MG TABS Take 5 mg by mouth daily.   No facility-administered encounter medications on file as of 07/06/2020.    Objective: ROS  General: NAD EYES: denies vision changes ENMT: denies dysphagia Cardiovascular: denies chest pain Pulmonary: denies  cough, denies increased SOB at rest, endorse DOE Abdomen: endorses fair appetite, denies  constipation, endorses continence of bowel GU: denies dysuria, endorses continence of urine MSK:  endorses ROM limitations, no falls reported, R LE amputation Skin: UE skin tears, LLE ulcers Neurological: endorses weakness, endorses pain, denies insomnia Psych: Endorses calm mood Heme/lymph/immuno: denies bruises, abnormal bleeding  Physical Exam: Current and past weights:178 lb per Epic record Constitutional: NAD General: frail appearing, WNWD EYES: anicteric sclera, lids intact, no discharge  ENMT: intact hearing,oral mucous membranes moist CV: no LE edema noted, compression dressing in place Pulmonary: no increased work of breathing, no cough, no audible wheezes, room air Abdomen: intake 50-75%, no ascites GU: deferred MSK: mod sarcopenia, decreased ROM in all extremities, R amputation BKA,  non ambulatory Skin: warm and dry, dressings on UE and LLE covering skin tears and wound, respectively Neuro: Generalized weakness,  nocognitive impairment Psych: non-anxious  affect, A and O x 3  Hem/lymph/immuno: no widespread bruising   Thank you for the opportunity to participate in the care of Mr. Wymore.  The palliative care team will continue to follow. Please call our office at [phone] if we can be of additional assistance.  Jason Coop, NP , DNP, MPH, AGPCNP-BC, ACHPN  COVID-19 PATIENT SCREENING TOOL  Person answering questions: ____________self______ _____   1.  Is the patient or any family member in the home showing any signs or symptoms regarding respiratory infection?               Person with Symptom- __________NA_________________  a. Fever                                                                          Yes___ No___          ___________________  b. Shortness of breath                                                    Yes___ No___          ___________________ c. Cough/congestion                                       Yes___  No___         ___________________ d. Body aches/pains                                                         Yes___ No___        ____________________ e. Gastrointestinal symptoms (diarrhea, nausea)           Yes___ No___        ____________________  2. Within the past 14 days, has anyone living in the home had any contact with someone with or under investigation for COVID-19?    Yes___ No_X_   Person __________________

## 2020-07-06 NOTE — Telephone Encounter (Signed)
Spoke with the patient and he is scheduled with Dr. Lucky Cowboy for a LLE angio on 07/17/20 with a 8:15 am arrival time to the [facility]. Covid testing on 07/13/20 between 8-1 pm at the [facility]. Pre-procedure instructions were discussed and will be mailed.

## 2020-07-07 ENCOUNTER — Encounter: Payer: Self-pay | Admitting: Physician Assistant

## 2020-07-07 ENCOUNTER — Ambulatory Visit (INDEPENDENT_AMBULATORY_CARE_PROVIDER_SITE_OTHER): Payer: Medicare Other | Admitting: Medical

## 2020-07-07 ENCOUNTER — Other Ambulatory Visit: Payer: Self-pay

## 2020-07-07 VITALS — BP 112/64 | HR 72 | Ht 68.0 in | Wt 180.0 lb

## 2020-07-07 DIAGNOSIS — E782 Mixed hyperlipidemia: Secondary | ICD-10-CM

## 2020-07-07 DIAGNOSIS — I10 Essential (primary) hypertension: Secondary | ICD-10-CM | POA: Diagnosis not present

## 2020-07-07 DIAGNOSIS — I4821 Permanent atrial fibrillation: Secondary | ICD-10-CM | POA: Diagnosis not present

## 2020-07-07 DIAGNOSIS — I25118 Atherosclerotic heart disease of native coronary artery with other forms of angina pectoris: Secondary | ICD-10-CM | POA: Diagnosis not present

## 2020-07-07 DIAGNOSIS — I5022 Chronic systolic (congestive) heart failure: Secondary | ICD-10-CM

## 2020-07-07 DIAGNOSIS — N179 Acute kidney failure, unspecified: Secondary | ICD-10-CM

## 2020-07-07 DIAGNOSIS — Z720 Tobacco use: Secondary | ICD-10-CM

## 2020-07-07 NOTE — Patient Instructions (Addendum)
Medication Instructions:  No changes  *If you need a refill on your cardiac medications before your next appointment, please call your pharmacy*   Lab Work: BMET & Dig level today  If you have labs (blood work) drawn today and your tests are completely normal, you will receive your results only by: [name] MyChart Message (if you have MyChart) OR . A paper copy in the mail If you have any lab test that is abnormal or we need to change your treatment, we will call you to review the results.   Testing/Procedures: None   Follow-Up: At [facility], you and your health needs are our priority.  As part of our continuing mission to provide you with exceptional heart care, we have created designated Provider Care Teams.  These Care Teams include your primary Cardiologist (physician) and Advanced Practice Providers (APPs -  Physician Assistants and Nurse Practitioners) who all work together to provide you with the care you need, when you need it.   Your next appointment:   3 month(s)  The format for your next appointment:   In Person  Provider:   You may see Ida Rogue, MD or one of the following Advanced Practice Providers on your designated Care Team:    Murray Hodgkins, NP  Christell Faith, PA-C  Marrianne Mood, PA-C  Cadence [place], [place]  Laurann [place], NP

## 2020-07-08 LAB — DIGOXIN LEVEL: Digoxin, Serum: 1.3 ng/mL — ABNORMAL HIGH (ref 0.5–0.9)

## 2020-07-08 LAB — BASIC METABOLIC PANEL
BUN/Creatinine Ratio: 25 — ABNORMAL HIGH (ref 9–20)
BUN: 42 mg/dL — ABNORMAL HIGH (ref 6–24)
CO2: 32 mmol/L — ABNORMAL HIGH (ref 20–29)
Calcium: 9.7 mg/dL (ref 8.7–10.2)
Chloride: 86 mmol/L — ABNORMAL LOW (ref 96–106)
Creatinine, Ser: 1.65 mg/dL — ABNORMAL HIGH (ref 0.76–1.27)
GFR calc Af Amer: 52 mL/min/{1.73_m2} — ABNORMAL LOW (ref >59)
GFR calc non Af Amer: 45 mL/min/{1.73_m2} — ABNORMAL LOW (ref >59)
Glucose: 278 mg/dL — ABNORMAL HIGH (ref 65–99)
Potassium: 3.8 mmol/L (ref 3.5–5.2)
Sodium: 135 mmol/L (ref 134–144)

## 2020-07-10 ENCOUNTER — Encounter (HOSPITAL_COMMUNITY): Payer: Self-pay

## 2020-07-10 ENCOUNTER — Other Ambulatory Visit (HOSPITAL_COMMUNITY): Payer: Self-pay

## 2020-07-10 ENCOUNTER — Telehealth (INDEPENDENT_AMBULATORY_CARE_PROVIDER_SITE_OTHER): Payer: Self-pay

## 2020-07-10 NOTE — Telephone Encounter (Signed)
Referral has been fax over to [facility] to [place] for left le unna wrap. Just waiting on an response to see if they will be able start services for patient. I spoke with Ms [name] and she will make the patient aware with information.

## 2020-07-10 NOTE — Progress Notes (Signed)
Today had a home visit with Delmas.  He states been doing pretty good.  He appears he feels well today.  He denies any fluid today.  He states urinates a lot throughout the day, clear in color.  He has una boots on leg, abdomen is soft and non tender.  Lungs are clear.  He denies shortness of breath.  Advised when not going off and at home to elevate his leg.  He states he does.  He drives family and friends to appts.  He is getting out and about.  He is aware of up coming appts.  He has all his medications and aware of how to take them.  He has no trouble in affording his medications at this time, he states he is paying more right now due to deductibles.  He is sitting and getting around by a manual wheel chair.  He states needs new batteries for his electric wheel chair.  He states can not afford them right now.  Will see if the foundation can help out, is a lot easier and healthier for him with the electric wheel chair.  Advised him any sign of fluid or any medical problems I may help him with to call me.  His girlfriend has my number.  He is aware to contact Otila Kluver with HF clinic or Dr Gwenyth Ober office also.  Will continue to visit for heart failure, diet and medication compliance.   [place] [phone]

## 2020-07-11 ENCOUNTER — Encounter (INDEPENDENT_AMBULATORY_CARE_PROVIDER_SITE_OTHER): Payer: Self-pay | Admitting: Nurse Practitioner

## 2020-07-11 NOTE — Telephone Encounter (Signed)
I have reach out to several home health agencies to start home health for left unna wrap but due to short staffing or not accepting insurance I was not able to get services. I spoke with the patient and he stated that [facility] will be coming in the home for physical therapy and to wrap his left leg.

## 2020-07-13 ENCOUNTER — Other Ambulatory Visit: Payer: Self-pay

## 2020-07-13 ENCOUNTER — Other Ambulatory Visit
Admission: RE | Admit: 2020-07-13 | Discharge: 2020-07-13 | Disposition: A | Discharge status: Home / Self Care | Payer: Medicare Other | Source: Ambulatory Visit | Attending: Vascular Surgery | Admitting: Vascular Surgery

## 2020-07-13 DIAGNOSIS — Z20822 Contact with and (suspected) exposure to covid-19: Secondary | ICD-10-CM | POA: Diagnosis not present

## 2020-07-13 DIAGNOSIS — Z01812 Encounter for preprocedural laboratory examination: Secondary | ICD-10-CM | POA: Insufficient documentation

## 2020-07-14 LAB — SARS CORONAVIRUS 2 (TAT 6-24 HRS): SARS Coronavirus 2: NEGATIVE

## 2020-07-17 ENCOUNTER — Encounter: Payer: Self-pay | Admitting: Vascular Surgery

## 2020-07-17 ENCOUNTER — Ambulatory Visit
Admission: RE | Admit: 2020-07-17 | Discharge: 2020-07-17 | Disposition: A | Discharge status: Home / Self Care | Payer: Medicare Other | Attending: Vascular Surgery | Admitting: Vascular Surgery

## 2020-07-17 ENCOUNTER — Encounter
Admission: RE | Disposition: A | Discharge status: Home / Self Care | Payer: Self-pay | Source: Home / Self Care | Attending: Vascular Surgery

## 2020-07-17 ENCOUNTER — Other Ambulatory Visit: Payer: Self-pay

## 2020-07-17 ENCOUNTER — Other Ambulatory Visit (INDEPENDENT_AMBULATORY_CARE_PROVIDER_SITE_OTHER): Payer: Self-pay | Admitting: Nurse Practitioner

## 2020-07-17 DIAGNOSIS — L97529 Non-pressure chronic ulcer of other part of left foot with unspecified severity: Secondary | ICD-10-CM | POA: Insufficient documentation

## 2020-07-17 DIAGNOSIS — E11621 Type 2 diabetes mellitus with foot ulcer: Secondary | ICD-10-CM | POA: Diagnosis not present

## 2020-07-17 DIAGNOSIS — I110 Hypertensive heart disease with heart failure: Secondary | ICD-10-CM | POA: Insufficient documentation

## 2020-07-17 DIAGNOSIS — F17210 Nicotine dependence, cigarettes, uncomplicated: Secondary | ICD-10-CM | POA: Diagnosis not present

## 2020-07-17 DIAGNOSIS — Z79899 Other long term (current) drug therapy: Secondary | ICD-10-CM | POA: Diagnosis not present

## 2020-07-17 DIAGNOSIS — I5042 Chronic combined systolic (congestive) and diastolic (congestive) heart failure: Secondary | ICD-10-CM | POA: Insufficient documentation

## 2020-07-17 DIAGNOSIS — Z7901 Long term (current) use of anticoagulants: Secondary | ICD-10-CM | POA: Diagnosis not present

## 2020-07-17 DIAGNOSIS — L97909 Non-pressure chronic ulcer of unspecified part of unspecified lower leg with unspecified severity: Secondary | ICD-10-CM

## 2020-07-17 DIAGNOSIS — I70245 Atherosclerosis of native arteries of left leg with ulceration of other part of foot: Secondary | ICD-10-CM | POA: Diagnosis not present

## 2020-07-17 DIAGNOSIS — Z7902 Long term (current) use of antithrombotics/antiplatelets: Secondary | ICD-10-CM | POA: Diagnosis not present

## 2020-07-17 DIAGNOSIS — I70249 Atherosclerosis of native arteries of left leg with ulceration of unspecified site: Secondary | ICD-10-CM

## 2020-07-17 DIAGNOSIS — E1151 Type 2 diabetes mellitus with diabetic peripheral angiopathy without gangrene: Secondary | ICD-10-CM | POA: Diagnosis not present

## 2020-07-17 DIAGNOSIS — I70299 Other atherosclerosis of native arteries of extremities, unspecified extremity: Secondary | ICD-10-CM

## 2020-07-17 HISTORY — PX: LOWER EXTREMITY ANGIOGRAPHY: CATH118251

## 2020-07-17 LAB — GLUCOSE, CAPILLARY: Glucose-Capillary: 110 mg/dL — ABNORMAL HIGH (ref 70–99)

## 2020-07-17 SURGERY — LOWER EXTREMITY ANGIOGRAPHY
Anesthesia: Moderate Sedation | Site: Leg Lower | Laterality: Left

## 2020-07-17 MED ORDER — MIDAZOLAM HCL 2 MG/ML PO SYRP: 8.0000 mg | ORAL_SOLUTION | Freq: Once | ORAL | Status: DC | PRN

## 2020-07-17 MED ORDER — SODIUM CHLORIDE 0.9% FLUSH: 3.0000 mL | INTRAVENOUS | Status: DC | PRN

## 2020-07-17 MED ORDER — ACETAMINOPHEN 325 MG PO TABS: 650.0000 mg | ORAL_TABLET | ORAL | Status: DC | PRN

## 2020-07-17 MED ORDER — HYDROMORPHONE HCL 1 MG/ML IJ SOLN
1.0000 mg | Freq: Once | INTRAMUSCULAR | Status: AC | PRN
Start: 2020-07-17 — End: 2020-07-17
  Administered 2020-07-17: 1 mg via INTRAVENOUS

## 2020-07-17 MED ORDER — SODIUM CHLORIDE 0.9 % IV SOLN: 250.0000 mL | INTRAVENOUS | Status: DC | PRN

## 2020-07-17 MED ORDER — ONDANSETRON HCL 4 MG/2ML IJ SOLN: 4.0000 mg | Freq: Four times a day (QID) | INTRAMUSCULAR | Status: DC | PRN

## 2020-07-17 MED ORDER — MIDAZOLAM HCL 2 MG/2ML IJ SOLN
INTRAMUSCULAR | Status: AC
  Filled 2020-07-17: qty 2

## 2020-07-17 MED ORDER — SODIUM CHLORIDE 0.9% FLUSH: 3.0000 mL | Freq: Two times a day (BID) | INTRAVENOUS | Status: DC

## 2020-07-17 MED ORDER — HYDRALAZINE HCL 20 MG/ML IJ SOLN
5.0000 mg | INTRAMUSCULAR | Status: DC | PRN
Start: 2020-07-17 — End: 2020-07-17

## 2020-07-17 MED ORDER — LABETALOL HCL 5 MG/ML IV SOLN: 10.0000 mg | INTRAVENOUS | Status: DC | PRN

## 2020-07-17 MED ORDER — MIDAZOLAM HCL 2 MG/2ML IJ SOLN
INTRAMUSCULAR | Status: DC | PRN
  Administered 2020-07-17: 2 mg via INTRAVENOUS

## 2020-07-17 MED ORDER — FAMOTIDINE 20 MG PO TABS: 40.0000 mg | ORAL_TABLET | Freq: Once | ORAL | Status: DC | PRN

## 2020-07-17 MED ORDER — CEFAZOLIN SODIUM-DEXTROSE 2-4 GM/100ML-% IV SOLN
2.0000 g | Freq: Once | INTRAVENOUS | Status: AC
  Administered 2020-07-17: 2 g via INTRAVENOUS

## 2020-07-17 MED ORDER — SODIUM CHLORIDE 0.9 % IV SOLN: INTRAVENOUS | Status: AC

## 2020-07-17 MED ORDER — DIPHENHYDRAMINE HCL 50 MG/ML IJ SOLN: 50.0000 mg | Freq: Once | INTRAMUSCULAR | Status: DC | PRN

## 2020-07-17 MED ORDER — HEPARIN SODIUM (PORCINE) 1000 UNIT/ML IJ SOLN
INTRAMUSCULAR | Status: AC
  Filled 2020-07-17: qty 1

## 2020-07-17 MED ORDER — SODIUM CHLORIDE 0.9 % IV SOLN: INTRAVENOUS | Status: DC

## 2020-07-17 MED ORDER — FENTANYL CITRATE (PF) 100 MCG/2ML IJ SOLN
INTRAMUSCULAR | Status: AC
  Filled 2020-07-17: qty 2

## 2020-07-17 MED ORDER — HYDROCORTISONE NA SUCCINATE PF 250 MG IJ SOLR
125.0000 mg | INTRAMUSCULAR | Status: DC | PRN
  Filled 2020-07-17: qty 125

## 2020-07-17 MED ORDER — FENTANYL CITRATE (PF) 100 MCG/2ML IJ SOLN
INTRAMUSCULAR | Status: DC | PRN
  Administered 2020-07-17 (×2): 50 ug via INTRAVENOUS

## 2020-07-17 MED ORDER — HYDROMORPHONE HCL 1 MG/ML IJ SOLN
INTRAMUSCULAR | Status: AC
  Filled 2020-07-17: qty 1

## 2020-07-17 MED ORDER — HEPARIN SODIUM (PORCINE) 1000 UNIT/ML IJ SOLN
INTRAMUSCULAR | Status: DC | PRN
  Administered 2020-07-17: 4000 [IU] via INTRAVENOUS

## 2020-07-17 SURGICAL SUPPLY — 12 items
BALLN LUTONIX 5X150X130 (BALLOONS) ×2
BALLOON LUTONIX 5X150X130 (BALLOONS) ×1 IMPLANT
CATH ANGIO 5F PIGTAIL 65CM (CATHETERS) ×2 IMPLANT
DEVICE STARCLOSE SE CLOSURE (Vascular Products) ×2 IMPLANT
GLIDEWIRE ADV .035X260CM (WIRE) ×2 IMPLANT
KIT ENCORE 26 ADVANTAGE (KITS) ×2 IMPLANT
PACK ANGIOGRAPHY (CUSTOM PROCEDURE TRAY) ×2 IMPLANT
SHEATH ANL2 6FRX45 HC (SHEATH) ×2 IMPLANT
SHEATH BRITE TIP 5FRX11 (SHEATH) ×2 IMPLANT
SYR MEDRAD MARK 7 150ML (SYRINGE) ×2 IMPLANT
TUBING CONTRAST HIGH PRESS 20 (MISCELLANEOUS) ×8 IMPLANT
WIRE GUIDERIGHT .035X150 (WIRE) ×2 IMPLANT

## 2020-07-17 NOTE — Interval H&P Note (Signed)
History and Physical Interval Note:  07/17/2020 8:58 AM  [name] [name]  has presented today for surgery, with the diagnosis of LLE Angiography   ASO with ulceration   BARD Rep   cc: M Godley, S Wil[place]   Pt to have Covid test on 2-3.  The various methods of treatment have been discussed with the patient and family. After consideration of risks, benefits and other options for treatment, the patient has consented to  Procedure(s): LOWER EXTREMITY ANGIOGRAPHY (Left) as a surgical intervention.  The patient's history has been reviewed, patient examined, no change in status, stable for surgery.  I have reviewed the patient's chart and labs.  Questions were answered to the patient's satisfaction.     Leotis Pain

## 2020-07-17 NOTE — Op Note (Signed)
[facility]  Percutaneous Study/Intervention Procedural Note   Date of Surgery: 07/17/2020  Surgeon(s):Ziyana Morikawa    Assistants:none  Pre-operative Diagnosis: PAD with ulceration left lower extremity  Post-operative diagnosis:  Same  Procedure(s) Performed:             1.  Ultrasound guidance for vascular access right femoral artery             2.  Catheter placement into left common femoral artery from right femoral approach             3.  Aortogram and selective left lower extremity angiogram             4.  Percutaneous transluminal angioplasty of left common femoral artery and proximal SFA with 5 mm diameter by 15 cm length Lutonix drug-coated angioplasty balloon   EBL: 10 cc  Contrast: 40 cc  Fluoro Time: 2.1 minutes  Moderate Conscious Sedation Time: approximately 24 minutes using 2 mg of Versed and 100 mcg of Fentanyl              Indications:  Patient is a 60 y.o.male with nonhealing ulcerations of the left foot. The patient has noninvasive study showing monophasic flow in the left foot. The patient is brought in for angiography for further evaluation and potential treatment.  Due to the limb threatening nature of the situation, angiogram was performed for attempted limb salvage. The patient is aware that if the procedure fails, amputation would be expected.  The patient also understands that even with successful revascularization, amputation may still be required due to the severity of the situation.  Risks and benefits are discussed and informed consent is obtained.   Procedure:  The patient was identified and appropriate procedural time out was performed.  The patient was then placed supine on the table and prepped and draped in the usual sterile fashion. Moderate conscious sedation was administered during a face to face encounter with the patient throughout the procedure with my supervision of the RN administering medicines and monitoring the  patient's vital signs, pulse oximetry, telemetry and mental status throughout from the start of the procedure until the patient was taken to the recovery room. Ultrasound was used to evaluate the right common femoral artery.  It was patent but quite diseased.  A digital ultrasound image was acquired.  A Seldinger needle was used to access the right common femoral artery under direct ultrasound guidance and a permanent image was performed.  A 0.035 J wire was advanced without resistance and a 5Fr sheath was placed.  Pigtail catheter was placed into the aorta and an AP aortogram was performed. The renal arteries appeared patent.  The previously placed left iliac stents were patent.  The right iliac had mild to moderate disease.  The aorta was calcific but not stenotic. I then crossed the aortic bifurcation and advanced to the left femoral head. Selective left lower extremity angiogram was then performed. This demonstrated moderate disease of the common femoral artery and the first 4 to 5 cm of the superficial femoral artery in the 60 to 70% range. This tracked down to just above the previously placed stent. All of the stents in the SFA and popliteal artery are widely patent with less than 10% stenosis. There was then two-vessel runoff distally with both posterior tibial and peroneal arteries which were patent without significant stenosis. It was felt that it was in the patient's best interest to proceed with intervention after these images to  avoid a second procedure and a larger amount of contrast and fluoroscopy based off of the findings from the initial angiogram. The patient was systemically heparinized and a 6 [place] Ansell sheath was then placed over the [employer] wire. I then used a Kumpe catheter and the advantage wire to cross the common femoral and SFA disease. I then treated this area with a 5 mm diameter by 15 cm length Lutonix drug-coated angioplasty balloon inflated to 12 atm for 1 minute.  Completion imaging showed some improvement but still some stenosis in the 40 to 50% range at the distal common femoral artery and most proximal SFA. At this point, I did not want to be more aggressive and if perfusion is not improved, femoral endarterectomy will need to be considered. I elected to terminate the procedure. The sheath was removed and StarClose closure device was deployed in the right femoral artery with excellent hemostatic result. The patient was taken to the recovery room in stable condition having tolerated the procedure well.  Findings:               Aortogram:  The renal arteries appeared patent.  The previously placed left iliac stents were patent.  The right iliac had mild to moderate disease.  The aorta was calcific but not stenotic.             Left lower Extremity:  This demonstrated moderate disease of the common femoral artery and the first 4 to 5 cm of the superficial femoral artery in the 60 to 70% range. This tracked down to just above the previously placed stent. All of the stents in the SFA and popliteal artery are widely patent with less than 10% stenosis. There was then two-vessel runoff distally with both posterior tibial and peroneal arteries which were patent without significant stenosis   Disposition: Patient was taken to the recovery room in stable condition having tolerated the procedure well.  Complications: None  Leotis Pain 07/17/2020 10:54 AM   This note was created with Dragon Medical transcription system. Any errors in dictation are purely unintentional.

## 2020-07-17 NOTE — Progress Notes (Signed)
Wound care nurse at bedside.

## 2020-07-17 NOTE — Consult Note (Addendum)
[facility] Nurse Consult Note: Reason for Consult: Consult requested to change left leg Una boot in Cath lab recovery area.  Pt states it has been in place for 3 weeks; home health was supposed to change weekly but he does not know what happened. Wound type: Removed previous Una boot; previous partial thickness wounds appear to be greatly improved, according to patient.  All are pink and dry and shallow; left calf .3X.3X.1cm, 1X1X.1cm, .5X.5X.1cm.  Leg without edema or weeping.  Toes and inner spaces with large amt dry crusted brown scabs, removed easily and this revealed pink dry skin.  Dressing procedure/placement/frequency: Pt does not plan to be admitted. Topical treatment orders provided for nurses: Leave left leg Una boot in place: have home health change Q week. Pt states he will followup with the vascular team after discharge.  Please re-consult if further assistance is needed.  Thank-you,  Julien Girt MSN, [place], [place], [place], [place]

## 2020-07-18 ENCOUNTER — Encounter: Payer: Self-pay | Admitting: Vascular Surgery

## 2020-07-20 ENCOUNTER — Other Ambulatory Visit: Payer: Self-pay

## 2020-07-20 ENCOUNTER — Ambulatory Visit: Payer: Medicare Other | Attending: Family | Admitting: Family

## 2020-07-20 ENCOUNTER — Encounter: Payer: Self-pay | Admitting: Family

## 2020-07-20 VITALS — BP 95/53 | HR 54 | Resp 18 | Ht 67.0 in | Wt 185.0 lb

## 2020-07-20 DIAGNOSIS — L97909 Non-pressure chronic ulcer of unspecified part of unspecified lower leg with unspecified severity: Secondary | ICD-10-CM

## 2020-07-20 DIAGNOSIS — Z7901 Long term (current) use of anticoagulants: Secondary | ICD-10-CM | POA: Insufficient documentation

## 2020-07-20 DIAGNOSIS — I5042 Chronic combined systolic (congestive) and diastolic (congestive) heart failure: Secondary | ICD-10-CM | POA: Insufficient documentation

## 2020-07-20 DIAGNOSIS — M549 Dorsalgia, unspecified: Secondary | ICD-10-CM | POA: Diagnosis not present

## 2020-07-20 DIAGNOSIS — R0602 Shortness of breath: Secondary | ICD-10-CM | POA: Diagnosis not present

## 2020-07-20 DIAGNOSIS — Z888 Allergy status to other drugs, medicaments and biological substances status: Secondary | ICD-10-CM | POA: Diagnosis not present

## 2020-07-20 DIAGNOSIS — R062 Wheezing: Secondary | ICD-10-CM | POA: Insufficient documentation

## 2020-07-20 DIAGNOSIS — I5022 Chronic systolic (congestive) heart failure: Secondary | ICD-10-CM

## 2020-07-20 DIAGNOSIS — J449 Chronic obstructive pulmonary disease, unspecified: Secondary | ICD-10-CM | POA: Diagnosis not present

## 2020-07-20 DIAGNOSIS — Z7951 Long term (current) use of inhaled steroids: Secondary | ICD-10-CM | POA: Diagnosis not present

## 2020-07-20 DIAGNOSIS — G8929 Other chronic pain: Secondary | ICD-10-CM | POA: Diagnosis not present

## 2020-07-20 DIAGNOSIS — R42 Dizziness and giddiness: Secondary | ICD-10-CM | POA: Diagnosis not present

## 2020-07-20 DIAGNOSIS — Z79899 Other long term (current) drug therapy: Secondary | ICD-10-CM | POA: Diagnosis not present

## 2020-07-20 DIAGNOSIS — I110 Hypertensive heart disease with heart failure: Secondary | ICD-10-CM | POA: Diagnosis not present

## 2020-07-20 DIAGNOSIS — Z8249 Family history of ischemic heart disease and other diseases of the circulatory system: Secondary | ICD-10-CM | POA: Insufficient documentation

## 2020-07-20 DIAGNOSIS — I10 Essential (primary) hypertension: Secondary | ICD-10-CM

## 2020-07-20 DIAGNOSIS — F17210 Nicotine dependence, cigarettes, uncomplicated: Secondary | ICD-10-CM | POA: Diagnosis not present

## 2020-07-20 DIAGNOSIS — R519 Headache, unspecified: Secondary | ICD-10-CM | POA: Diagnosis not present

## 2020-07-20 DIAGNOSIS — Z955 Presence of coronary angioplasty implant and graft: Secondary | ICD-10-CM | POA: Insufficient documentation

## 2020-07-20 DIAGNOSIS — E1151 Type 2 diabetes mellitus with diabetic peripheral angiopathy without gangrene: Secondary | ICD-10-CM | POA: Diagnosis not present

## 2020-07-20 DIAGNOSIS — I2510 Atherosclerotic heart disease of native coronary artery without angina pectoris: Secondary | ICD-10-CM | POA: Insufficient documentation

## 2020-07-20 DIAGNOSIS — Z713 Dietary counseling and surveillance: Secondary | ICD-10-CM | POA: Insufficient documentation

## 2020-07-20 DIAGNOSIS — Z89511 Acquired absence of right leg below knee: Secondary | ICD-10-CM | POA: Insufficient documentation

## 2020-07-20 DIAGNOSIS — I70299 Other atherosclerosis of native arteries of extremities, unspecified extremity: Secondary | ICD-10-CM

## 2020-07-20 DIAGNOSIS — Z882 Allergy status to sulfonamides status: Secondary | ICD-10-CM | POA: Insufficient documentation

## 2020-07-20 DIAGNOSIS — E1159 Type 2 diabetes mellitus with other circulatory complications: Secondary | ICD-10-CM

## 2020-07-20 DIAGNOSIS — Z720 Tobacco use: Secondary | ICD-10-CM

## 2020-07-20 NOTE — Progress Notes (Signed)
Patient ID: [name] [name], male    DOB: [birth year], 60 y.o.   MRN: [id number]  HPI  [name] [name] is a 60 y/o male with a history of obstructive sleep apnea, PVD, PAD, HTN, hyperlipidemia, DM, CAD, COPD, BPH, asthma, current tobacco use and chronic heart failure.   Echo report from 06/17/20 reviewed and showed an EF of <20% along with elevated PA pressure of 53.2 mmHg and mild/ moderate TR. Echo report from 01/20/20 reviewed and showed an EF of <20% along with mild [name] and moderate TR. Echo report from 09/29/19 reviewed and showed an EF of 20-25% along with moderately elevated PA pressure, mild [name] and mild/moderate TR. Echo report from 11/06/2018 reviewed and showed an EF of 20-25% along with moderate TR and a PA pressure of 59.6 mmHg. Echo report from 04/25/17 reviewed and shows an EF of 25-30% along with a PA pressure of 34 mm Hg. EF has declined from 55-60% back in 2016.   LHC done 01/20/20 showed:  Prox RCA-1 lesion is 80% stenosed.  Prox RCA-2 lesion is 20% stenosed.  Prox RCA to Mid RCA lesion is 80% stenosed.  Prox LAD lesion is 20% stenosed.  1st Diag lesion is 80% stenosed.  Mid LAD lesion is 20% stenosed.  Dist LAD lesion is 60% stenosed.  Prox Cx to Mid Cx lesion is 90% stenosed.  LPAV lesion is 95% stenosed.   1. Significant underlying three-vessel coronary artery disease. Patent proximal LAD stent with mild in-stent restenosis. Moderate diffuse disease in the distal LAD. Significant stenosis in the proximal left circumflex at the origin of the posterior AV groove artery which is also heavily diseased at the ostium. This is a bifurcation lesion and heavily calcified. RCA stent is patent. However, there is significant proximal disease in the whole mid to distal segment is diffusely diseased and small caliber. 2. Left ventricular angiography was not performed. EF was severe reduced by echo. 3. Moderately elevated left ventricular end-diastolic pressure at 28 mmHg  Cardiac  catheterization done 04/28/17 showed significant three-vessel disease with a patient stent in the RCA and LAD. Significant proximal RCS disease and the stent as well as diffuse mid and distal disease. LAD has moderate disease. Severely elevated left ventricular end-diastolic pressure at 34 mmHg. Possible CABG in the future with optimizing medical management. Stress done in 2015.  Admitted 06/17/20 due to HF exacerbation. Placed on milrinone and Lasix infusion and then transitioned to oral diuretics. Nephrology, cardiology, palliative care and wound consults obtained. Discharged after 14 days. Was in the ED 03/26/20 due to right shoulder pain where he was evaluated and released.   He presents today for a follow-up visit with a chief complaint of minimal fatigue upon moderate exertion. He describes this as chronic in nature having been present for several years with varying levels of severity. He has associated cough, shortness of breath, wheezing, headaches, light-headedness and chronic back pain along with this. He denies any difficulty sleeping, abdominal distention, palpitations, pedal edema, chest pain or weight gain.   Says that he's feeling "ok" but that he's "tired of it all". Recently had left leg angiogram that didn't work and he has an appointment next week with specialist about this.   Has a long dog scratch on his right forearm. No signs of infection noted.   Past Medical History:  Diagnosis Date  . Arthritis   . Asthma   . Atherosclerosis   . BPH (benign prostatic hyperplasia)   . Carotid arterial disease ([place])   .  Charcot's joint of foot, right   . Chronic combined systolic (congestive) and diastolic (congestive) heart failure ([place])    a. 02/2013 EF 50% by LV gram; b. 04/2017 Echo: EF 25-30%. diff HK. Gr2 DD. Mod dil LA/RV. PASP 42mHg.  .Marland KitchenCOPD (chronic obstructive pulmonary disease) ([place]   . Coronary artery disease    a. 2013 S/P PCI of LAD ([facility];  b. 03/2013 PCI: RCA 90p ( 2.5 x 23  mm DES); c. 02/2014 Cath: patent RCA stent->Med Rx; d. 04/2017 Cath: LM nl, LAd 30ost/p, 671m, D1 80ost, LCX 90p/m, OM1 85, RCA 70/20p, 8054m0d->Referred for CT Surg-felt to be poor candidate.  . Diabetic neuropathy ([facility]CC[place] . Diabetic ulcer of right foot (HCC[place]elan[place] . Hernia   . Hyperlipidemia   . Hypertension   . Ischemic cardiomyopathy    a. 04/2017 Echo: EF 25-30%; b. 08/2017 Cardiac MRI ([facility]): EF 19%, sev glob HK. RVEF 20%, mod BAE, triv [name], mild-mod TR. Basal lateral subendocardial infarct (viable), inf/infsept, dist septal ischemia, basal to mid lat peri-infarct ischemia.  . Morbid obesity (HCCWest Babylon . Neuropathy   . PAD (peripheral artery disease) ([place]CC[place]  a. Followed by [name]. DewLucky Cowboy. 08/2010 Periph Angio: RSFA 70-80p (6X60 self-expanding stent); c. 09/2010 Periph Angio: L SFA 100p (7x unknown length self-expanding stent); d. 06/2015 Periph Angio: R SFA short segment occlusion (6x12 self-expanding stent).  . Restless leg syndrome   . Sleep apnea   . Subclavian artery stenosis, right (HCCAyr . Syncope and collapse   . Tobacco use    a. 75+ yr hx - still smoking 1ppd, down from 2 ppd.  . Type II diabetes mellitus (HCCAlto Bonito [place] . Varicose vein    Past Surgical History:  Procedure Laterality Date  . ABDOMINAL AORTAGRAM N/A 06/23/2013   Procedure: ABDOMINAL AORMaxcine HamSurgeon: MuhWellington HampshireD;  [facility];  Service: Cardiovascular;  Laterality: N/A;  . AMPUTATION Right 04/01/2018   Procedure: AMPUTATION BELOW KNEE;  Surgeon: DewAlgernon HuxleyD;  Location: [facility];  Service: Vascular;  Laterality: Right;  . APPENDECTOMY    . CARDIAC CATHETERIZATION  10/14   [facility]; X1 STENT PROXIMAL RCA  . CARDIAC CATHETERIZATION  02/2010   [facility]  . CARDIAC CATHE[facility]IZATION  [facility]/21/2014   [facility]  . CLAVICLE SURGERY    . HERNIA REPAIR    . IRRIGATION AND DEBRIDEMENT FOOT Right 01/04/2018   Procedure: IRRIGATION AND DEBRIDEMENT FOOT;  Surgeon: FowSamara DeistPM;  Location: [facility];  Service: Podiatry;   Laterality: Right;  . LEFT HEART CATH AND CORONARY ANGIOGRAPHY N/A 01/20/2020   Procedure: LEFT HEART CATH AND CORONARY ANGIOGRAPHY;  Surgeon: AriWellington HampshireD;  [facility];  Service: Cardiovascular;  Laterality: N/A;  . LEFT HEART CATH AND CORS/GRAFTS ANGIOGRAPHY N/A 04/28/2017   Procedure: LEFT HEART CATH AND CORONARY ANGIOGRAPHY;  Surgeon: AriWellington HampshireD;  [facility];  Service: Cardiovascular;  Laterality: N/A;  . LOWER EXTREMITY ANGIOGRAPHY Right 03/03/2018   Procedure: LOWER EXTREMITY ANGIOGRAPHY;  Surgeon: SchKatha CabalD;  [facility];  Service: Cardiovascular;  Laterality: Right;  . LOWER EXTREMITY ANGIOGRAPHY Left 08/24/2018   Procedure: LOWER EXTREMITY ANGIOGRAPHY;  Surgeon: DewAlgernon HuxleyD;  [facility];  Service: Cardiovascular;  Laterality: Left;  . LOWER EXTREMITY ANGIOGRAPHY Left 06/14/2019   Procedure: LOWER EXTREMITY ANGIOGRAPHY;  Surgeon: DewAlgernon HuxleyD;  [facility];  Service: Cardiovascular;  Laterality: Left;  . LOWER EXTREMITY ANGIOGRAPHY Left 08/02/2019   Procedure: LOWER EXTREMITY ANGIOGRAPHY;  Surgeon: Algernon Huxley, MD;  Location: [facility];  Service: Cardiovascular;  Laterality: Left;  . LOWER EXTREMITY ANGIOGRAPHY Left 07/17/2020   Procedure: LOWER EXTREMITY ANGIOGRAPHY;  Surgeon: Algernon Huxley, MD;  Location: [facility];  Service: Cardiovascular;  Laterality: Left;  . PERIPHERAL ARTERIAL STENT GRAFT     x2 left/right  . PERIPHERAL VASCULAR BALLOON ANGIOPLASTY Right 01/06/2018   Procedure: PERIPHERAL VASCULAR BALLOON ANGIOPLASTY;  Surgeon: Katha Cabal, MD;  Location: [facility];  Service: Cardiovascular;  Laterality: Right;  . PERIPHERAL VASCULAR CATHETERIZATION N/A 07/06/2015   Procedure: Abdominal Aortogram w/Lower Extremity;  Surgeon: Algernon Huxley, MD;  Location: [facility];  Service: Cardiovascular;  Laterality:  N/A;  . PERIPHERAL VASCULAR CATHETERIZATION  07/06/2015   Procedure: Lower Extremity Intervention;  Surgeon: Algernon Huxley, MD;  Location: [facility];  Service: Cardiovascular;;   Family History  Problem Relation Age of Onset  . Heart attack Father   . Heart disease Father   . Hypertension Father   . Hyperlipidemia Father   . Hypertension Mother   . Hyperlipidemia Mother    Social History   Tobacco Use  . Smoking status: Current Every Day Smoker    Packs/day: 1.00    Years: 41.00    Pack years: 41.00    Types: Cigarettes  . Smokeless tobacco: Never Used  Substance Use Topics  . Alcohol use: No   Allergies  Allergen Reactions  . Dulaglutide Anaphylaxis, Diarrhea and Hives  . Other Itching    Skin itching associated with nitro patch  . Prednisone Rash   Prior to Admission medications   Medication Sig Start Date End Date Taking? Authorizing Provider  albuterol (VENTOLIN HFA) 108 (90 Base) MCG/ACT inhaler Inhale into the lungs every 6 (six) hours as needed for wheezing or shortness of breath.   Yes [provider]  atorvastatin (LIPITOR) 80 MG tablet Take 80 mg by mouth daily.   Yes [provider]  carvedilol (COREG) 3.125 MG tablet Take 1 tablet (3.125 mg total) by mouth 2 (two) times daily with a meal. 07/01/20  Yes Shawna Clamp, MD  clopidogrel (PLAVIX) 75 MG tablet Take 75 mg by mouth daily.  08/22/17  Yes [provider]  digoxin (LANOXIN) 0.125 MG tablet Take 1 tablet (0.125 mg total) by mouth daily. 07/02/20  Yes Shawna Clamp, MD  DULoxetine (CYMBALTA) 20 MG capsule Take 30 mg by mouth daily.    Yes [provider]  ELIQUIS 5 MG TABS tablet Take 5 mg by mouth 2 (two) times daily. 02/02/20  Yes [provider]  empagliflozin (JARDIANCE) 10 MG TABS tablet Take 10 mg by mouth daily.  02/03/20  Yes [provider]  ezetimibe (ZETIA) 10 MG tablet Take 10 mg by mouth daily.   Yes [provider]  gabapentin  (NEURONTIN) 800 MG tablet Take 800 mg by mouth 4 (four) times daily.  08/14/17 04/04/29 Yes [provider]  Ipratropium-Albuterol (COMBIVENT RESPIMAT) 20-100 MCG/ACT AERS respimat Inhale 1 puff into the lungs every 6 (six) hours as needed for wheezing.   Yes [provider]  ipratropium-albuterol (DUONEB) 0.5-2.5 (3) MG/3ML SOLN Inhale 3 mLs into the lungs every 6 (six) hours as needed (wheezing/sob).    Yes [provider]  isosorbide mononitrate (IMDUR) 30 MG 24 hr tablet Take 30  mg by mouth daily.   Yes [provider]  Multiple Vitamin (MULTI-VITAMINS) TABS Take 1 tablet by mouth daily.    Yes [provider]  nitroGLYCERIN (NITROSTAT) 0.4 MG SL tablet Place 1 tablet (0.4 mg total) under the tongue every 5 (five) minutes as needed for chest pain. 05/19/19  Yes Amylynn Fano, Otila Kluver A, FNP  omeprazole (PRILOSEC) 40 MG capsule Take 40 mg by mouth daily.   Yes [provider]  Oxycodone HCl 10 MG TABS Take 10 mg by mouth every 6 (six) hours as needed (pain).  01/15/18  Yes [provider]  potassium chloride (KLOR-CON) 10 MEQ tablet Take 10 mEq by mouth 2 (two) times daily.   Yes [provider]  sodium chloride 1 g tablet Take 1 tablet (1 g total) by mouth 3 (three) times daily with meals. 06/12/20  Yes Darylene Price A, FNP  spironolactone (ALDACTONE) 25 MG tablet Take 0.5 tablets (12.5 mg total) by mouth daily. 07/02/20  Yes Shawna Clamp, MD  SYMBICORT 80-4.5 MCG/ACT inhaler Inhale 2 puffs into the lungs daily.  01/01/20  Yes [provider]  tamsulosin (FLOMAX) 0.4 MG CAPS capsule Take 1 capsule (0.4 mg total) by mouth daily after supper. 07/01/20  Yes Shawna Clamp, MD  torsemide (DEMADEX) 20 MG tablet Take 2 tablets (40 mg total) by mouth daily. 07/01/20  Yes Shawna Clamp, MD  traZODone (DESYREL) 150 MG tablet Take 150 mg by mouth at bedtime. 07/12/19  Yes [provider]  Vericiguat 5 MG TABS Take 5 mg by mouth daily.    Yes [provider]    Review of Systems  Constitutional: Positive for fatigue. Negative for appetite change.  HENT: Positive for rhinorrhea. Negative for congestion, postnasal drip and sore throat.   Eyes: Negative.   Respiratory: Positive for cough, shortness of breath ("little bit") and wheezing. Negative for chest tightness.   Cardiovascular: Negative for chest pain, palpitations and leg swelling.  Gastrointestinal: Negative for abdominal distention and abdominal pain.  Endocrine: Negative.   Genitourinary: Negative.   Musculoskeletal: Positive for back pain. Negative for neck pain.       Has right lower leg prosthesis  Skin: Positive for wound (left foot).  Allergic/Immunologic: Negative.   Neurological: Positive for light-headedness and headaches. Negative for dizziness.  Hematological: Negative for adenopathy. Bruises/bleeds easily.  Psychiatric/Behavioral: Negative for dysphoric mood and sleep disturbance (sleeping on 2 pillows). The patient is not nervous/anxious.    Vitals:   07/20/20 1358  BP: (!) 95/53  Pulse: (!) 54  Resp: 18  SpO2: 98%  Weight: 185 lb (83.9 kg)  Height: '5\' 7"'$  (1.702 m)   Wt Readings from Last 3 Encounters:  07/20/20 185 lb (83.9 kg)  07/17/20 178 lb (80.7 kg)  07/07/20 180 lb (81.6 kg)   Lab Results  Component Value Date   CREATININE 1.65 (H) 07/07/2020   CREATININE 1.33 (H) 07/01/2020   CREATININE 1.11 06/30/2020    Physical Exam Vitals and nursing note reviewed.  Constitutional:      Appearance: He is well-developed.  HENT:     Head: Normocephalic and atraumatic.  Neck:     Vascular: No JVD.  Cardiovascular:     Rate and Rhythm: Regular rhythm. Bradycardia present.  Pulmonary:     Effort: Pulmonary effort is normal.     Breath sounds: No wheezing, rhonchi or rales.  Abdominal:     General: There is no distension.     Palpations: Abdomen is soft.  Tenderness: There is no abdominal tenderness.  Musculoskeletal:         General: Deformity (right leg prosthesis present) present. No tenderness.     Cervical back: Normal range of motion and neck supple.     Left lower leg: No tenderness. No edema.  Skin:    General: Skin is warm and dry.     Findings: Erythema (left lower leg) present.     Comments: Scabbed scratch on right forearm  Neurological:     Mental Status: He is alert and oriented to person, place, and time.  Psychiatric:        Behavior: Behavior normal.        Thought Content: Thought content normal.     Assessment & Plan:  1: Chronic heart failure with reduced ejection fraction- - NYHA class II - euvolemic today - not weighing daily; instructed to resume and to call for an overnight weight gain of >2 pounds or a weekly weight gain of >5 pounds - weight down 34 pounds from last visit 3 months ago - saw cardiology [name] [name]) 07/07/20 - palliative care visit done 07/06/20 - BNP 06/17/20 was 1925.5 - participating in paramedicine program  - not interested in COVID vaccine at this time  2: HTN- - BP on the low side today - had telemedicine visit with PCP [name] [name] at [facility] primary care 07/12/20 - BMP done 07/07/20 reviewed and shows sodium 135, potassium 3.8, creatinine 1.65 and GFR 45  3: Tobacco use- - currently smoking ~ 3 cigarettes daily - complete cessation discussed for 3 minutes with him  4: Severe atherosclerosis of lower extremities- - had left lower leg angiogram 07/17/20 - saw vascular [name] [name]) 07/05/20 - 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 Each medication was verbally reviewed with the patient and he was encouraged to bring the bottles to every visit to confirm accuracy of list.   Due to numerous other provider appointments & patient's preference, will not make another appointment at this time. Advised patient to continue f/u with [name] [name] and should he have any questions  in the future or desire another appointment, he can call back and patient was comfortable with this plan.

## 2020-07-20 NOTE — Patient Instructions (Addendum)
Continue weighing daily and call for an overnight weight gain of > 2 pounds or a weekly weight gain of >5 pounds.   Call us in the future if you'd like to schedule another appointment 

## 2020-07-26 ENCOUNTER — Ambulatory Visit: Payer: Medicare Other | Admitting: Podiatry

## 2020-07-26 ENCOUNTER — Other Ambulatory Visit (HOSPITAL_COMMUNITY): Payer: Self-pay

## 2020-08-03 ENCOUNTER — Other Ambulatory Visit: Payer: Medicare Other | Admitting: Primary Care

## 2020-08-03 ENCOUNTER — Other Ambulatory Visit: Payer: Self-pay

## 2020-08-03 ENCOUNTER — Telehealth (INDEPENDENT_AMBULATORY_CARE_PROVIDER_SITE_OTHER): Payer: Self-pay

## 2020-08-03 ENCOUNTER — Other Ambulatory Visit (INDEPENDENT_AMBULATORY_CARE_PROVIDER_SITE_OTHER): Payer: Self-pay | Admitting: Nurse Practitioner

## 2020-08-03 DIAGNOSIS — I872 Venous insufficiency (chronic) (peripheral): Secondary | ICD-10-CM

## 2020-08-03 DIAGNOSIS — J441 Chronic obstructive pulmonary disease with (acute) exacerbation: Secondary | ICD-10-CM

## 2020-08-03 DIAGNOSIS — I25118 Atherosclerotic heart disease of native coronary artery with other forms of angina pectoris: Secondary | ICD-10-CM

## 2020-08-03 DIAGNOSIS — Z515 Encounter for palliative care: Secondary | ICD-10-CM

## 2020-08-03 DIAGNOSIS — I5020 Unspecified systolic (congestive) heart failure: Secondary | ICD-10-CM

## 2020-08-03 MED ORDER — LEVOFLOXACIN 750 MG PO TABS: 750.0000 mg | ORAL_TABLET | Freq: Every day | ORAL | 0 refills | Status: DC

## 2020-08-03 NOTE — Telephone Encounter (Signed)
Katherine Basset from [facility] called and left a VM on the nurses line saying that the pt has a new wound on his LLE between his big toe and second toe. Kim noted that the pt's leg is bright red and has a foul odor and drainage. Their is no warmth to the area but the nurse is concerned about infection. The nurse would like to know does the pt need to be seen and can she get wound care orders for he wound. Please advise.

## 2020-08-03 NOTE — Telephone Encounter (Signed)
Aquacell between toes.  Is he weeping or is there a wound that the foul smelling drainage is from? I have sent an order for antibiotics to his pharmacy.  Patient should have a post op visit scheduled after his angio

## 2020-08-03 NOTE — Telephone Encounter (Signed)
She was speaking of between his toes, an I made the nurse aware of the NP instructions via her VM .

## 2020-08-03 NOTE — Telephone Encounter (Signed)
Patient called to be scheduled but hung up, no appt was scheduled for the patient. I will attempt to callback tomorrow to schedule.  This note is for documentation purposes only.

## 2020-08-03 NOTE — Telephone Encounter (Signed)
I called the pt an left a Vm making him aware that an antibiotic was called in for him.

## 2020-08-03 NOTE — Progress Notes (Signed)
[facility] Palliative Care Consult Note Telephone: [phone]  Fax: [phone]    Date of encounter: 08/03/20 PATIENT NAME: [name] [name] [id number] [phone] (home)  DOB: 09/11/1960 MRN: [id number]  PRIMARY CARE PROVIDER:    [name], MD,  [name] [address] [phone]  REFERRING PROVIDER:   [name], MD [place],  [place] [address] [phone]  RESPONSIBLE PARTY:   Extended Emergency Contact Information Primary Emergency Contact: HectorElizabeth Address: [address]          [place], [place] [address] Johnnette Litter of [place] Phone: [phone] Mobile Phone: [phone] Relation: Significant other  I met face to face with patient and family in  Home. Palliative Care was asked to follow this patient by consultation request of [name], MD to help address advance care planning and goals of care. This is a follow up  visit.   ASSESSMENT AND RECOMMENDATIONS:   1. Advance Care Planning/Goals of Care: Goals include to maximize quality of life and symptom management. Our advance care planning conversation included a discussion about:     Exploration of personal, cultural or spiritual beliefs that might influence medical decisions   2. Symptom Management:   COPD: We reviewed his hospital stay and specifically his respiratory status.  He does continue to smoke. He does take some inhalers daily.  We discussed pulmonary follow up again. I would recommend pulmonology follow for optimal COPD management.   Pain Controlled on oxycodone and tylenol. States satisfaction with current regimen.   Wounds: Has skin tears on UE and LLE. These are healing. He has home health that is coming to him home for wound care. They saw him today and changed his LLE dressing. I met with patient in his home with his significant other. His  three dogs were out and he was not able to put them up but we proceeded with the visit. Patient states [place] is coming  and has wrapped his leg although he said it's too tight and he plans to take off the wrap.   Vascular:He has follow up with vascular which a reminder call came in during our visit and he acknowledges.  3. Follow up Palliative Care Visit: Palliative care will continue to follow for goals of care clarification and symptom management. Return 6-8 weeks or prn.  4. Family /Caregiver/Community Supports: Patient lives with partner Kathlee Nations who is present at visit, but is taking a nap  5. Cognitive / Functional decline: no cognitive issues  I spent 40 minutes providing this consultation,  from to 1450 to 1530. More than 50% of the time in this consultation was spent in counseling and care coordination.  CODE STATUS: Full code   PPS: 50%  HOSPICE ELIGIBILITY/DIAGNOSIS: TBD  Subjective:  CHIEF COMPLAINT: follow up, recent hospitalization/LLE angiogram  HISTORY OF PRESENT ILLNESS:  [name] [name] is a 60 y.o. year old male  with CHF, DM, COPD, LE wounds and neuropathy, tobacco use disorder. He was seen today in his home. He had been seen by home health for a dressing change prior to my arrival. He is also being followed by PT. He has had multiple visits today from his healthcare team.   We are asked to consult around advance care planning and complex medical decision making.    Review and summarization of old Epic records shows or history from other than  patient.  Review or lab tests, radiology,  or medicine: Review of epic labs and studies History obtained from review of EMR, discussion with primary team, and  interview with family, caregiver  and/or Mr. Simson. Records reviewed and summarized above.   CURRENT PROBLEM LIST:  Patient Active Problem List   Diagnosis Date Noted  . Palliative care by specialist   . Acute on chronic heart failure ([place])   . CHF (congestive heart  failure) ([place]) 06/18/2020  . CHF exacerbation ([place]) 06/17/2020  . Chronic anticoagulation 06/17/2020  . Chronic venous insufficiency 02/10/2020  . HFrEF (heart failure with reduced ejection fraction) ([place]) 01/30/2020  . COPD with acute exacerbation ([place]) 01/30/2020  . Persistent atrial fibrillation ([place]) 01/19/2020  . Chronic bilateral low back pain with sciatica 10/08/2019  . Medication management contract signed 10/08/2019  . Cellulitis of left lower leg 09/27/2019  . Cellulitis of foot, left 09/27/2019  . Chronic hyponatremia 09/27/2019  . Cellulitis of left leg 08/31/2019  . CAD (coronary artery disease) 08/31/2019  . Acute on chronic systolic CHF (congestive heart failure) ([place]) 11/05/2018  . Open toe wound 10/01/2018  . Venous ulcer of left leg ([place]) 08/19/2018  . 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[place]) 04/28/2018  . Sepsis ([place]) 03/30/2018  . Abnormal MRI, shoulder (Right) 02/23/2018  . Abnormal MRI, cervical spine (2016) 02/23/2018  . DDD (degenerative disc disease), cervical 02/23/2018  . Cervical foraminal stenosis (Bilateral) 02/23/2018  . Cervical central spinal stenosis 02/23/2018  . Cervical facet hypertrophy 02/23/2018  . Arthralgia of acromioclavicular joint (Right) 02/23/2018  . Osteoarthritis of  AC (acromioclavicular) joint (Right) 02/23/2018  . Biceps tendinosis of shoulder (Right) 02/23/2018  . Tendinopathy of rotator cuff (Right) 02/23/2018  . Vitamin D deficiency 02/23/2018  . Atherosclerosis of native arteries of the extremities with ulceration ([place]) 02/15/2018  . Peripheral edema 02/05/2018  . Status post peripherally inserted central catheter (PICC) central line placement 02/04/2018  . Chronic lower extremity pain (Primary Area of Pain) (Right) 01/29/2018  . Chronic shoulder pain [facility] Area of Pain) (Right) 01/29/2018  . Chronic foot pain (Secondary Area of Pain) (Right) 01/29/2018  . Chronic pain syndrome 01/29/2018  . Long term current  use of opiate analgesic 01/29/2018  . Medication monitoring encounter 01/29/2018  . Disorder of skeletal system 01/29/2018  . Problems influencing health status 01/29/2018  . AKI (acute kidney injury) ([place]) 01/03/2018  . NSTEMI (non-ST elevated myocardial infarction) ([place]) 11/21/2017  . Coronary artery disease of native artery of native heart with stable angina pectoris ([place]) 09/10/2017  . HLD (hyperlipidemia) 09/10/2017  . Angina pectoris ([place]) 09/08/2017  . Angina at rest [facility]) 08/13/2017  . Lymphedema 05/02/2017  . Chronic systolic CHF (congestive heart failure) ([place]) 04/23/2017  . Chest pain 07/11/2016  . Unstable angina ([place]) 07/11/2016  . Foot ulcer (Right)   . Stable angina (HCC)   . Pressure injury of skin 05/06/2016  . Diabetic foot infection ([place]) 05/05/2016  . Type 2 diabetes mellitus with other specified complication ([place]) 38/75/6433  . Cellulitis 11/21/2015  . Morbid obesity ([place]) 11/17/2014  . PAD (peripheral artery disease) ([place])   . Hypertension   . Tobacco use   . COPD (chronic obstructive pulmonary disease) ([place]) 05/19/2012  . Diabetic neuropathy ([place]) 05/19/2012  . Peripheral vascular disease ([place]) 03/27/2012   PAST MEDICAL HISTORY:  Active Ambulatory Problems    Diagnosis Date Noted  . PAD (peripheral artery disease) ([place])   . Coronary artery disease of native artery of native heart  with stable angina pectoris ([place]) 09/10/2017  . HLD (hyperlipidemia) 09/10/2017  . Hypertension   . Tobacco use   . Morbid obesity ([place]) 11/17/2014  . Cellulitis 11/21/2015  . Diabetic foot infection ([place]) 05/05/2016  . Pressure injury of skin 05/06/2016  . Chest pain 07/11/2016  . Unstable angina ([place]) 07/11/2016  . Foot ulcer (Right)   . Stable angina (HCC)   . Chronic systolic CHF (congestive heart failure) ([place]) 04/23/2017  . Type 2 diabetes mellitus with other specified complication ([place]) 74/82/7078  . Lymphedema 05/02/2017  . NSTEMI (non-ST elevated myocardial  infarction) ([place]) 11/21/2017  . AKI (acute kidney injury) ([place]) 01/03/2018  . COPD (chronic obstructive pulmonary disease) ([place]) 05/19/2012  . Diabetic neuropathy ([place]) 05/19/2012  . Angina at rest [facility]) 08/13/2017  . Angina pectoris ([place]) 09/08/2017  . Chronic lower extremity pain (Primary Area of Pain) (Right) 01/29/2018  . Chronic shoulder pain [facility] Area of Pain) (Right) 01/29/2018  . Chronic foot pain (Secondary Area of Pain) (Right) 01/29/2018  . Chronic pain syndrome 01/29/2018  . Long term current use of opiate analgesic 01/29/2018  . Medication monitoring encounter 01/29/2018  . Disorder of skeletal system 01/29/2018  . Problems influencing health status 01/29/2018  . Status post peripherally inserted central catheter (PICC) central line placement 02/04/2018  . Peripheral edema 02/05/2018  . Atherosclerosis of native arteries of the extremities with ulceration ([place]) 02/15/2018  . Abnormal MRI, shoulder (Right) 02/23/2018  . Abnormal MRI, cervical spine (2016) 02/23/2018  . DDD (degenerative disc disease), cervical 02/23/2018  . Cervical foraminal stenosis (Bilateral) 02/23/2018  . Cervical central spinal stenosis 02/23/2018  . Cervical facet hypertrophy 02/23/2018  . Arthralgia of acromioclavicular joint (Right) 02/23/2018  . Osteoarthritis of  AC (acromioclavicular) joint (Right) 02/23/2018  . Biceps tendinosis of shoulder (Right) 02/23/2018  . Tendinopathy of rotator cuff (Right) 02/23/2018  . Vitamin D deficiency 02/23/2018  . Sepsis ([place]) 03/30/2018  . 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[place]) 04/28/2018  . Venous ulcer of left leg ([place]) 08/19/2018  . Open toe wound 10/01/2018  . Acute on chronic systolic CHF (congestive heart failure) ([place]) 11/05/2018  . Cellulitis of left leg 08/31/2019  . CAD (coronary artery disease) 08/31/2019  . Cellulitis of left lower leg 09/27/2019  . Cellulitis of foot, left 09/27/2019  . Chronic hyponatremia 09/27/2019  .  Chronic bilateral low back pain with sciatica 10/08/2019  . Persistent atrial fibrillation ([place]) 01/19/2020  . Chronic venous insufficiency 02/10/2020  . HFrEF (heart failure with reduced ejection fraction) ([place]) 01/30/2020  . COPD with acute exacerbation ([place]) 01/30/2020  . Peripheral vascular disease ([place]) 03/27/2012  . Medication management contract signed 10/08/2019  . CHF exacerbation ([place]) 06/17/2020  . Chronic anticoagulation 06/17/2020  . CHF (congestive heart failure) ([place]) 06/18/2020  . Acute on chronic heart failure ([place])   . Palliative care by specialist    Resolved Ambulatory Problems    Diagnosis Date Noted  . Hypoxia   . Shortness of breath   . Sepsis ([place]) 12/22/2017   Past Medical History:  Diagnosis Date  . Arthritis   . Asthma   . Atherosclerosis   . BPH (benign prostatic hyperplasia)   . Carotid arterial disease ([place])   . Charcot's joint of foot, right   . Chronic combined systolic (congestive) and diastolic (congestive) heart failure ([place])   . Coronary artery disease   . Diabetic ulcer of right foot ([place])   . Hernia   . Hyperlipidemia   . Ischemic cardiomyopathy   .  Neuropathy   . Restless leg syndrome   . Sleep apnea   . Subclavian artery stenosis, right ([place])   . Syncope and collapse   . Type II diabetes mellitus ([place])   . Varicose vein    SOCIAL HX:  Social History   Tobacco Use  . Smoking status: Current Every Day Smoker    Packs/day: 1.00    Years: 41.00    Pack years: 41.00    Types: Cigarettes  . Smokeless tobacco: Never Used  Substance Use Topics  . Alcohol use: No   FAMILY HX:  Family History  Problem Relation Age of Onset  . Heart attack Father   . Heart disease Father   . Hypertension Father   . Hyperlipidemia Father   . Hypertension Mother   . Hyperlipidemia Mother       ALLERGIES:  Allergies  Allergen Reactions  . Dulaglutide Anaphylaxis, Diarrhea and Hives  . Other Itching    Skin itching associated with nitro  patch  . Prednisone Rash     PERTINENT MEDICATIONS:  Outpatient Encounter Medications as of 08/03/2020  Medication Sig  . albuterol (VENTOLIN HFA) 108 (90 Base) MCG/ACT inhaler Inhale into the lungs every 6 (six) hours as needed for wheezing or shortness of breath.  [name] atorvastatin (LIPITOR) 80 MG tablet Take 80 mg by mouth daily.  . carvedilol (COREG) 3.125 MG tablet Take 1 tablet (3.125 mg total) by mouth 2 (two) times daily with a meal.  . clopidogrel (PLAVIX) 75 MG tablet Take 75 mg by mouth daily.   . digoxin (LANOXIN) 0.125 MG tablet Take 1 tablet (0.125 mg total) by mouth daily.  . DULoxetine (CYMBALTA) 20 MG capsule Take 30 mg by mouth daily.   [name] ELIQUIS 5 MG TABS tablet Take 5 mg by mouth 2 (two) times daily.  . empagliflozin (JARDIANCE) 10 MG TABS tablet Take 10 mg by mouth daily.   [name] ezetimibe (ZETIA) 10 MG tablet Take 10 mg by mouth daily.  [name] gabapentin (NEURONTIN) 800 MG tablet Take 800 mg by mouth 4 (four) times daily.   . Ipratropium-Albuterol (COMBIVENT RESPIMAT) 20-100 MCG/ACT AERS respimat Inhale 1 puff into the lungs every 6 (six) hours as needed for wheezing.  [name] ipratropium-albuterol (DUONEB) 0.5-2.5 (3) MG/3ML SOLN Inhale 3 mLs into the lungs every 6 (six) hours as needed (wheezing/sob).   . isosorbide mononitrate (IMDUR) 30 MG 24 hr tablet Take 30 mg by mouth daily.  . Multiple Vitamin (MULTI-VITAMINS) TABS Take 1 tablet by mouth daily.   . nitroGLYCERIN (NITROSTAT) 0.4 MG SL tablet Place 1 tablet (0.4 mg total) under the tongue every 5 (five) minutes as needed for chest pain.  [name] omeprazole (PRILOSEC) 40 MG capsule Take 40 mg by mouth daily.  . Oxycodone HCl 10 MG TABS Take 10 mg by mouth every 6 (six) hours as needed (pain).   . potassium chloride (KLOR-CON) 10 MEQ tablet Take 10 mEq by mouth 2 (two) times daily.  . sodium chloride 1 g tablet Take 1 tablet (1 g total) by mouth 3 (three) times daily with meals.  [name] spironolactone (ALDACTONE) 25 MG tablet Take 0.5 tablets  (12.5 mg total) by mouth daily.  . SYMBICORT 80-4.5 MCG/ACT inhaler Inhale 2 puffs into the lungs daily.   . tamsulosin (FLOMAX) 0.4 MG CAPS capsule Take 1 capsule (0.4 mg total) by mouth daily after supper.  . torsemide (DEMADEX) 20 MG tablet Take 2 tablets (40 mg total) by mouth daily.  . traZODone (DESYREL) 150  MG tablet Take 150 mg by mouth at bedtime.  . Vericiguat 5 MG TABS Take 5 mg by mouth daily.   No facility-administered encounter medications on file as of 08/03/2020.    Objective: ROS  General: NAD EYES: denies vision changes ENMT: denies dysphagia Cardiovascular: denies chest pain Pulmonary: denies cough, denies increased SOB GU: denies dysuria, endorses continence of urine MSK:  endorses ROM limitations, no falls reported Skin: denies rashes, has chronic LLE ulcer Heme/lymph/immuno: denies bruises, abnormal bleeding  Physical Exam: Current and past weights: stable Constitutional: NAD General:WNWD EYES: anicteric sclera, lids intact, no discharge  ENMT: intact hearing,oral mucous membranes moist, dentition intact Pulmonary: no increased work of breathing, no cough, no audible wheezes, room air GU: deferred MSK: mild sarcopenia, decreased ROM in all extremities, no contractures of LE, transfers from power chair to couch, L LE ulcers, R amputee Skin: warm and dry, no rashes,  LLE wound with dressing in place Neuro: Generalized weakness, no cognitive impairment Psych: non-anxious affect, A and O x 4 Hem/lymph/immuno: no widespread bruising   Thank you for the opportunity to participate in the care of Mr. Stradling.  The palliative care team will continue to follow. Please call our office at [phone] if we can be of additional assistance.  Jason Coop, NP , DNP, MPH, AGPCNP-BC, ACHPN   COVID-19 PATIENT SCREENING TOOL  Person answering questions: _______Patient____________   1.  Is the patient or any family member in the home showing any signs or  symptoms regarding respiratory infection?                  Person with Symptom  ______________na___________ a. Fever/chills/headache                                                        Yes___ No__X_            b. Shortness of breath                                                            Yes___ No__X_           c. Cough/congestion                                               Yes___  No__X_          d. Muscle/Body aches/pains                                                   Yes___ No__X_         e. Gastrointestinal symptoms (diarrhea,nausea)             Yes___ No__X_         f. Sudden loss of smell or taste      Yes___ No__X_        2. Within  the past 10 days, has anyone living in the home had any contact with someone with or under investigation for COVID-19?    Yes___ No__X__   Person __________________

## 2020-08-05 ENCOUNTER — Emergency Department: Payer: Medicare Other

## 2020-08-05 ENCOUNTER — Encounter: Payer: Self-pay | Admitting: Emergency Medicine

## 2020-08-05 ENCOUNTER — Other Ambulatory Visit: Payer: Self-pay

## 2020-08-05 ENCOUNTER — Inpatient Hospital Stay
Admission: EM | Admit: 2020-08-05 | Discharge: 2020-08-12 | DRG: 616 | Disposition: A | Discharge status: Home Health | Payer: Medicare Other | Attending: Internal Medicine | Admitting: Internal Medicine

## 2020-08-05 DIAGNOSIS — I248 Other forms of acute ischemic heart disease: Secondary | ICD-10-CM

## 2020-08-05 DIAGNOSIS — L97529 Non-pressure chronic ulcer of other part of left foot with unspecified severity: Secondary | ICD-10-CM | POA: Diagnosis present

## 2020-08-05 DIAGNOSIS — I255 Ischemic cardiomyopathy: Secondary | ICD-10-CM | POA: Diagnosis present

## 2020-08-05 DIAGNOSIS — Z7951 Long term (current) use of inhaled steroids: Secondary | ICD-10-CM

## 2020-08-05 DIAGNOSIS — R079 Chest pain, unspecified: Secondary | ICD-10-CM

## 2020-08-05 DIAGNOSIS — E1169 Type 2 diabetes mellitus with other specified complication: Principal | ICD-10-CM

## 2020-08-05 DIAGNOSIS — D6489 Other specified anemias: Secondary | ICD-10-CM | POA: Diagnosis present

## 2020-08-05 DIAGNOSIS — I472 Ventricular tachycardia: Secondary | ICD-10-CM | POA: Diagnosis present

## 2020-08-05 DIAGNOSIS — I110 Hypertensive heart disease with heart failure: Secondary | ICD-10-CM | POA: Diagnosis present

## 2020-08-05 DIAGNOSIS — I482 Chronic atrial fibrillation, unspecified: Secondary | ICD-10-CM

## 2020-08-05 DIAGNOSIS — L0390 Cellulitis, unspecified: Secondary | ICD-10-CM | POA: Diagnosis present

## 2020-08-05 DIAGNOSIS — L089 Local infection of the skin and subcutaneous tissue, unspecified: Secondary | ICD-10-CM

## 2020-08-05 DIAGNOSIS — I2510 Atherosclerotic heart disease of native coronary artery without angina pectoris: Secondary | ICD-10-CM | POA: Diagnosis present

## 2020-08-05 DIAGNOSIS — Z9119 Patient's noncompliance with other medical treatment and regimen: Secondary | ICD-10-CM

## 2020-08-05 DIAGNOSIS — Z9114 Patient's other noncompliance with medication regimen: Secondary | ICD-10-CM

## 2020-08-05 DIAGNOSIS — E11628 Type 2 diabetes mellitus with other skin complications: Secondary | ICD-10-CM

## 2020-08-05 DIAGNOSIS — Z7984 Long term (current) use of oral hypoglycemic drugs: Secondary | ICD-10-CM

## 2020-08-05 DIAGNOSIS — I10 Essential (primary) hypertension: Secondary | ICD-10-CM | POA: Diagnosis present

## 2020-08-05 DIAGNOSIS — I4819 Other persistent atrial fibrillation: Secondary | ICD-10-CM | POA: Diagnosis present

## 2020-08-05 DIAGNOSIS — G546 Phantom limb syndrome with pain: Secondary | ICD-10-CM | POA: Diagnosis present

## 2020-08-05 DIAGNOSIS — Z89511 Acquired absence of right leg below knee: Secondary | ICD-10-CM

## 2020-08-05 DIAGNOSIS — Z79899 Other long term (current) drug therapy: Secondary | ICD-10-CM

## 2020-08-05 DIAGNOSIS — Z955 Presence of coronary angioplasty implant and graft: Secondary | ICD-10-CM

## 2020-08-05 DIAGNOSIS — G894 Chronic pain syndrome: Secondary | ICD-10-CM | POA: Diagnosis present

## 2020-08-05 DIAGNOSIS — M869 Osteomyelitis, unspecified: Secondary | ICD-10-CM | POA: Diagnosis present

## 2020-08-05 DIAGNOSIS — J449 Chronic obstructive pulmonary disease, unspecified: Secondary | ICD-10-CM | POA: Diagnosis present

## 2020-08-05 DIAGNOSIS — E1151 Type 2 diabetes mellitus with diabetic peripheral angiopathy without gangrene: Secondary | ICD-10-CM | POA: Diagnosis present

## 2020-08-05 DIAGNOSIS — I272 Pulmonary hypertension, unspecified: Secondary | ICD-10-CM | POA: Diagnosis present

## 2020-08-05 DIAGNOSIS — I4821 Permanent atrial fibrillation: Secondary | ICD-10-CM | POA: Diagnosis present

## 2020-08-05 DIAGNOSIS — Z7902 Long term (current) use of antithrombotics/antiplatelets: Secondary | ICD-10-CM

## 2020-08-05 DIAGNOSIS — I252 Old myocardial infarction: Secondary | ICD-10-CM

## 2020-08-05 DIAGNOSIS — I5023 Acute on chronic systolic (congestive) heart failure: Secondary | ICD-10-CM

## 2020-08-05 DIAGNOSIS — G2581 Restless legs syndrome: Secondary | ICD-10-CM | POA: Diagnosis present

## 2020-08-05 DIAGNOSIS — L03116 Cellulitis of left lower limb: Secondary | ICD-10-CM | POA: Diagnosis not present

## 2020-08-05 DIAGNOSIS — I959 Hypotension, unspecified: Secondary | ICD-10-CM | POA: Diagnosis present

## 2020-08-05 DIAGNOSIS — E1140 Type 2 diabetes mellitus with diabetic neuropathy, unspecified: Secondary | ICD-10-CM | POA: Diagnosis present

## 2020-08-05 DIAGNOSIS — I509 Heart failure, unspecified: Secondary | ICD-10-CM

## 2020-08-05 DIAGNOSIS — S88111A Complete traumatic amputation at level between knee and ankle, right lower leg, initial encounter: Secondary | ICD-10-CM | POA: Diagnosis present

## 2020-08-05 DIAGNOSIS — Z20822 Contact with and (suspected) exposure to covid-19: Secondary | ICD-10-CM | POA: Diagnosis present

## 2020-08-05 DIAGNOSIS — I214 Non-ST elevation (NSTEMI) myocardial infarction: Secondary | ICD-10-CM | POA: Diagnosis present

## 2020-08-05 DIAGNOSIS — Z7901 Long term (current) use of anticoagulants: Secondary | ICD-10-CM

## 2020-08-05 DIAGNOSIS — I5043 Acute on chronic combined systolic (congestive) and diastolic (congestive) heart failure: Secondary | ICD-10-CM | POA: Diagnosis present

## 2020-08-05 DIAGNOSIS — E876 Hypokalemia: Secondary | ICD-10-CM | POA: Diagnosis present

## 2020-08-05 DIAGNOSIS — F17210 Nicotine dependence, cigarettes, uncomplicated: Secondary | ICD-10-CM | POA: Diagnosis present

## 2020-08-05 DIAGNOSIS — I739 Peripheral vascular disease, unspecified: Secondary | ICD-10-CM | POA: Diagnosis present

## 2020-08-05 DIAGNOSIS — E785 Hyperlipidemia, unspecified: Secondary | ICD-10-CM | POA: Diagnosis present

## 2020-08-05 DIAGNOSIS — E11622 Type 2 diabetes mellitus with other skin ulcer: Secondary | ICD-10-CM | POA: Diagnosis present

## 2020-08-05 DIAGNOSIS — N400 Enlarged prostate without lower urinary tract symptoms: Secondary | ICD-10-CM | POA: Diagnosis present

## 2020-08-05 HISTORY — DX: Heart failure, unspecified: I50.9

## 2020-08-05 LAB — CBC WITH DIFFERENTIAL/PLATELET
Abs Immature Granulocytes: 0.05 10*3/uL (ref 0.00–0.07)
Basophils Absolute: 0.1 10*3/uL (ref 0.0–0.1)
Basophils Relative: 1 %
Eosinophils Absolute: 0.1 10*3/uL (ref 0.0–0.5)
Eosinophils Relative: 1 %
HCT: 38.7 % — ABNORMAL LOW (ref 39.0–52.0)
Hemoglobin: 12.4 g/dL — ABNORMAL LOW (ref 13.0–17.0)
Immature Granulocytes: 1 %
Lymphocytes Relative: 10 %
Lymphs Abs: 0.8 10*3/uL (ref 0.7–4.0)
MCH: 27.7 pg (ref 26.0–34.0)
MCHC: 32 g/dL (ref 30.0–36.0)
MCV: 86.4 fL (ref 80.0–100.0)
Monocytes Absolute: 0.6 10*3/uL (ref 0.1–1.0)
Monocytes Relative: 8 %
Neutro Abs: 6.1 10*3/uL (ref 1.7–7.7)
Neutrophils Relative %: 79 %
Platelets: 143 10*3/uL — ABNORMAL LOW (ref 150–400)
RBC: 4.48 MIL/uL (ref 4.22–5.81)
RDW: 21.2 % — ABNORMAL HIGH (ref 11.5–15.5)
Smear Review: NORMAL
WBC: 7.5 10*3/uL (ref 4.0–10.5)
nRBC: 0 % (ref 0.0–0.2)

## 2020-08-05 LAB — COMPREHENSIVE METABOLIC PANEL
ALT: 9 U/L (ref 0–44)
AST: 17 U/L (ref 15–41)
Albumin: 3.6 g/dL (ref 3.5–5.0)
Alkaline Phosphatase: 92 U/L (ref 38–126)
Anion gap: 12 (ref 5–15)
BUN: 10 mg/dL (ref 6–20)
CO2: 31 mmol/L (ref 22–32)
Calcium: 9.1 mg/dL (ref 8.9–10.3)
Chloride: 92 mmol/L — ABNORMAL LOW (ref 98–111)
Creatinine, Ser: 1.19 mg/dL (ref 0.61–1.24)
GFR, Estimated: >60 mL/min (ref >60)
Glucose, Bld: 185 mg/dL — ABNORMAL HIGH (ref 70–99)
Potassium: 4 mmol/L (ref 3.5–5.1)
Sodium: 135 mmol/L (ref 135–145)
Total Bilirubin: 1.6 mg/dL — ABNORMAL HIGH (ref 0.3–1.2)
Total Protein: 6.7 g/dL (ref 6.5–8.1)

## 2020-08-05 LAB — LACTIC ACID, PLASMA
Lactic Acid, Venous: 1.3 mmol/L (ref 0.5–1.9)
Lactic Acid, Venous: 1.9 mmol/L (ref 0.5–1.9)

## 2020-08-05 LAB — BRAIN NATRIURETIC PEPTIDE: B Natriuretic Peptide: 1069.1 pg/mL — ABNORMAL HIGH (ref 0.0–100.0)

## 2020-08-05 MED ORDER — OXYCODONE HCL 5 MG PO TABS
10.0000 mg | ORAL_TABLET | Freq: Four times a day (QID) | ORAL | Status: DC | PRN
  Administered 2020-08-06 – 2020-08-09 (×10): 10 mg via ORAL
  Filled 2020-08-05 (×10): qty 2

## 2020-08-05 MED ORDER — PANTOPRAZOLE SODIUM 40 MG PO TBEC
40.0000 mg | DELAYED_RELEASE_TABLET | Freq: Every day | ORAL | Status: DC
  Administered 2020-08-06 – 2020-08-12 (×7): 40 mg via ORAL
  Filled 2020-08-05 (×7): qty 1

## 2020-08-05 MED ORDER — ALBUTEROL SULFATE HFA 108 (90 BASE) MCG/ACT IN AERS
1.0000 | INHALATION_SPRAY | Freq: Four times a day (QID) | RESPIRATORY_TRACT | Status: DC | PRN
  Filled 2020-08-05: qty 6.7

## 2020-08-05 MED ORDER — ATORVASTATIN CALCIUM 20 MG PO TABS
80.0000 mg | ORAL_TABLET | Freq: Every day | ORAL | Status: DC
  Administered 2020-08-06 – 2020-08-12 (×7): 80 mg via ORAL
  Filled 2020-08-05 (×7): qty 4

## 2020-08-05 MED ORDER — GABAPENTIN 400 MG PO CAPS
800.0000 mg | ORAL_CAPSULE | Freq: Four times a day (QID) | ORAL | Status: DC
  Administered 2020-08-06 – 2020-08-12 (×25): 800 mg via ORAL
  Filled 2020-08-05 (×27): qty 2

## 2020-08-05 MED ORDER — DULOXETINE HCL 30 MG PO CPEP
30.0000 mg | ORAL_CAPSULE | Freq: Every day | ORAL | Status: DC
  Administered 2020-08-06 – 2020-08-12 (×7): 30 mg via ORAL
  Filled 2020-08-05 (×7): qty 1

## 2020-08-05 MED ORDER — PIPERACILLIN-TAZOBACTAM 3.375 G IVPB 30 MIN
3.3750 g | Freq: Once | INTRAVENOUS | Status: AC
  Administered 2020-08-05: 3.375 g via INTRAVENOUS
  Filled 2020-08-05: qty 50

## 2020-08-05 MED ORDER — ACETAMINOPHEN 325 MG PO TABS
650.0000 mg | ORAL_TABLET | Freq: Four times a day (QID) | ORAL | Status: DC | PRN
  Administered 2020-08-09 – 2020-08-12 (×5): 650 mg via ORAL
  Filled 2020-08-05 (×6): qty 2

## 2020-08-05 MED ORDER — POTASSIUM CHLORIDE CRYS ER 10 MEQ PO TBCR
10.0000 meq | EXTENDED_RELEASE_TABLET | Freq: Two times a day (BID) | ORAL | Status: DC
  Administered 2020-08-06 – 2020-08-12 (×13): 10 meq via ORAL
  Filled 2020-08-05 (×15): qty 1

## 2020-08-05 MED ORDER — DIGOXIN 125 MCG PO TABS
0.1250 mg | ORAL_TABLET | Freq: Every day | ORAL | Status: DC
  Administered 2020-08-06 – 2020-08-12 (×6): 0.125 mg via ORAL
  Filled 2020-08-05 (×7): qty 1

## 2020-08-05 MED ORDER — NITROGLYCERIN 0.4 MG SL SUBL: 0.4000 mg | SUBLINGUAL_TABLET | SUBLINGUAL | Status: DC | PRN

## 2020-08-05 MED ORDER — POLYETHYLENE GLYCOL 3350 17 G PO PACK
17.0000 g | PACK | Freq: Every day | ORAL | Status: DC | PRN
  Administered 2020-08-09: 17 g via ORAL
  Filled 2020-08-05: qty 1

## 2020-08-05 MED ORDER — CARVEDILOL 6.25 MG PO TABS
3.1250 mg | ORAL_TABLET | Freq: Two times a day (BID) | ORAL | Status: DC
  Administered 2020-08-06 – 2020-08-12 (×12): 3.125 mg via ORAL
  Filled 2020-08-05 (×12): qty 1

## 2020-08-05 MED ORDER — TAMSULOSIN HCL 0.4 MG PO CAPS
0.4000 mg | ORAL_CAPSULE | Freq: Every day | ORAL | Status: DC
  Administered 2020-08-06 – 2020-08-11 (×6): 0.4 mg via ORAL
  Filled 2020-08-05 (×6): qty 1

## 2020-08-05 MED ORDER — ISOSORBIDE MONONITRATE ER 30 MG PO TB24
30.0000 mg | ORAL_TABLET | Freq: Every day | ORAL | Status: DC
  Administered 2020-08-06 – 2020-08-12 (×7): 30 mg via ORAL
  Filled 2020-08-05 (×7): qty 1

## 2020-08-05 MED ORDER — MOMETASONE FURO-FORMOTEROL FUM 100-5 MCG/ACT IN AERO
2.0000 | INHALATION_SPRAY | Freq: Two times a day (BID) | RESPIRATORY_TRACT | Status: DC
  Administered 2020-08-06 – 2020-08-12 (×13): 2 via RESPIRATORY_TRACT
  Filled 2020-08-05: qty 8.8

## 2020-08-05 MED ORDER — CLOPIDOGREL BISULFATE 75 MG PO TABS
75.0000 mg | ORAL_TABLET | Freq: Every day | ORAL | Status: DC
Start: 2020-08-06 — End: 2020-08-12
  Administered 2020-08-06 – 2020-08-12 (×7): 75 mg via ORAL
  Filled 2020-08-05 (×7): qty 1

## 2020-08-05 MED ORDER — APIXABAN 5 MG PO TABS
5.0000 mg | ORAL_TABLET | Freq: Two times a day (BID) | ORAL | Status: DC
  Administered 2020-08-06 (×2): 5 mg via ORAL
  Filled 2020-08-05 (×2): qty 1

## 2020-08-05 MED ORDER — SODIUM CHLORIDE 0.9% FLUSH
3.0000 mL | Freq: Two times a day (BID) | INTRAVENOUS | Status: DC
  Administered 2020-08-06 – 2020-08-12 (×11): 3 mL via INTRAVENOUS

## 2020-08-05 MED ORDER — ACETAMINOPHEN 650 MG RE SUPP: 650.0000 mg | Freq: Four times a day (QID) | RECTAL | Status: DC | PRN

## 2020-08-05 MED ORDER — TORSEMIDE 20 MG PO TABS
40.0000 mg | ORAL_TABLET | Freq: Every day | ORAL | Status: DC
  Administered 2020-08-06: 40 mg via ORAL
  Filled 2020-08-05: qty 2

## 2020-08-05 MED ORDER — SPIRONOLACTONE 25 MG PO TABS
12.5000 mg | ORAL_TABLET | Freq: Every day | ORAL | Status: DC
  Administered 2020-08-06 – 2020-08-08 (×3): 12.5 mg via ORAL
  Filled 2020-08-05: qty 1
  Filled 2020-08-05: qty 0.5
  Filled 2020-08-05: qty 1
  Filled 2020-08-05 (×2): qty 0.5
  Filled 2020-08-05: qty 1

## 2020-08-05 MED ORDER — VANCOMYCIN HCL 2000 MG/400ML IV SOLN
2000.0000 mg | Freq: Once | INTRAVENOUS | Status: AC
  Administered 2020-08-06: 2000 mg via INTRAVENOUS
  Filled 2020-08-05: qty 400

## 2020-08-05 NOTE — H&P (Signed)
History and Physical   [name] [name] [id number] DOB: 07-30-1960 DOA: 08/05/2020  PCP: [name], MD   Patient coming from: Home  Chief Complaint: Left foot laceration and redness  HPI: [name] [name] is a 60 y.o. male with medical history significant of CHF, CAD, hyperlipidemia, peripheral arterial disease, diabetes, COPD, history of cellulitis status post right BKA, cervical spinal stenosis, chronic pain, venous insufficiency, lymphedema, hypertension, A. fib, obesity who presents with left foot laceration and redness.  Patient states he has noticed increased dryness and cracking in his left foot worse in the last couple weeks.  He states that he began to notice a foul odor in the foot today and then he went to look more closely at it and noticed drainage in between his first and second toe.  He also noted warmth and redness of his foot compared to previous. He was concerned as he has had a prior diabetic foot infection for MRSA and ended up having to have his right lower leg amputated.  He does have some edema in that leg as well but he states this is not worse than his usual. He denies fever chills, shortness breath, abdominal pain, constipation, diarrhea, nausea, vomiting.  He does report some chest pains new on the right developed congestion and now chest pain on the left.  ED Course: Vital signs in the ED were stable.  Lab work-up showed CMP with chloride 97, glucose 185, T bili 1.6.  CBC showed hemoglobin 12.4, platelets 143.  Lactic acid normal x2.  BNP elevated to 1000 although this is normal to low for him.  Urinalysis pending.  Chest x-ray showed signs of CHF but was improved from 10 days ago.  Left foot x-ray showed no bony abnormality.  Patient started on Vanco and Zosyn in ED.  Review of Systems: As per HPI otherwise all other systems reviewed and are negative.  Past Medical History:  Diagnosis Date  . Acute on chronic heart failure ([place])   . AKI (acute kidney injury) ([place])  01/03/2018  . Angina at rest [facility]) 08/13/2017  . Arthritis   . Asthma   . Atherosclerosis   . BPH (benign prostatic hyperplasia)   . Carotid arterial disease ([place])   . Cellulitis of foot, left 09/27/2019  . Charcot's joint of foot, right   . Chronic combined systolic (congestive) and diastolic (congestive) heart failure ([place])    a. 02/2013 EF 50% by LV gram; b. 04/2017 Echo: EF 25-30%. diff HK. Gr2 DD. Mod dil LA/RV. PASP 10mHg.  .Marland KitchenChronic foot pain (Secondary Area of Pain) (Right) 01/29/2018  . COPD (chronic obstructive pulmonary disease) ([place]   . COPD with acute exacerbation ([place] 01/30/2020  . Coronary artery disease    a. 2013 S/P PCI of LAD ([facility];  b. 03/2013 PCI: RCA 90p ( 2.5 x 23 mm DES); c. 02/2014 Cath: patent RCA stent->Med Rx; d. 04/2017 Cath: LM nl, LAd 30ost/p, 613m, D1 80ost, LCX 90p/m, OM1 85, RCA 70/20p, 8023m0d->Referred for CT Surg-felt to be poor candidate.  . Coronary artery disease of native artery of native heart with stable angina pectoris (HCC[place]elig[place]/08/2017   [facility] Nuclear stress test showed inferior wall defect. Cardiac catheterization showed patent LAD stent with 90% stenosis in the proximal RCA. Ejection fraction was 50%. Status post PCI of the RCA with a 2.5 x 23 mm drug-eluting stent  . Diabetic  neuropathy ([place])   . Diabetic ulcer of right foot ([place])   . Hernia   . HFrEF (heart failure with reduced ejection fraction) ([place]) 01/30/2020  . Hyperlipidemia   . Hypertension   . Ischemic cardiomyopathy    a. 04/2017 Echo: EF 25-30%; b. 08/2017 Cardiac MRI ([facility]): EF 19%, sev glob HK. RVEF 20%, mod BAE, triv MR, mild-mod TR. Basal lateral subendocardial infarct (viable), inf/infsept, dist septal ischemia, basal to mid lat peri-infarct ischemia.  . Morbid obesity ([place])   . Neuropathy   . PAD (peripheral artery disease) ([place])    a. Followed by Dr. Lucky Cowboy; b. 08/2010 Periph Angio: RSFA 70-80p  (6X60 self-expanding stent); c. 09/2010 Periph Angio: L SFA 100p (7x unknown length self-expanding stent); d. 06/2015 Periph Angio: R SFA short segment occlusion (6x12 self-expanding stent).  . Restless leg syndrome   . Sepsis ([place]) 03/30/2018  . Sleep apnea   . Subclavian artery stenosis, right ([place])   . Syncope and collapse   . Tobacco use    a. 75+ yr hx - still smoking 1ppd, down from 2 ppd.  . Type II diabetes mellitus ([place])   . Unstable angina ([place]) 07/11/2016  . Varicose vein     Past Surgical History:  Procedure Laterality Date  . ABDOMINAL AORTAGRAM N/A 06/23/2013   Procedure: ABDOMINAL Maxcine Ham;  Surgeon: Wellington Hampshire, MD;  Location: [place] CATH LAB;  Service: Cardiovascular;  Laterality: N/A;  . AMPUTATION Right 04/01/2018   Procedure: AMPUTATION BELOW KNEE;  Surgeon: Algernon Huxley, MD;  Location: [facility] ORS;  Service: Vascular;  Laterality: Right;  . APPENDECTOMY    . CARDIAC CATHETERIZATION  10/14   [facility]; X1 STENT PROXIMAL RCA  . CARDIAC CATHETERIZATION  02/2010   [facility]  . CARDIAC CATHETERIZATION  [facility]/2014   [facility]  . CLAVICLE SURGERY    . HERNIA REPAIR    . IRRIGATION AND DEBRIDEMENT FOOT Right 01/04/2018   Procedure: IRRIGATION AND DEBRIDEMENT FOOT;  Surgeon: Samara Deist, DPM;  Location: [facility] ORS;  Service: Podiatry;  Laterality: Right;  . LEFT HEART CATH AND CORONARY ANGIOGRAPHY N/A 01/20/2020   Procedure: LEFT HEART CATH AND CORONARY ANGIOGRAPHY;  Surgeon: Wellington Hampshire, MD;  Location: [place] CV LAB;  Service: Cardiovascular;  Laterality: N/A;  . LEFT HEART CATH AND CORS/GRAFTS ANGIOGRAPHY N/A 04/28/2017   Procedure: LEFT HEART CATH AND CORONARY ANGIOGRAPHY;  Surgeon: Wellington Hampshire, MD;  Location: [place] CV LAB;  Service: Cardiovascular;  Laterality: N/A;  . LOWER EXTREMITY ANGIOGRAPHY Right 03/03/2018   Procedure: LOWER EXTREMITY ANGIOGRAPHY;  Surgeon: Katha Cabal, MD;  Location: [place] CV LAB;  Service: Cardiovascular;  Laterality:  Right;  . LOWER EXTREMITY ANGIOGRAPHY Left 08/24/2018   Procedure: LOWER EXTREMITY ANGIOGRAPHY;  Surgeon: Algernon Huxley, MD;  Location: [place] CV LAB;  Service: Cardiovascular;  Laterality: Left;  . LOWER EXTREMITY ANGIOGRAPHY Left 06/14/2019   Procedure: LOWER EXTREMITY ANGIOGRAPHY;  Surgeon: Algernon Huxley, MD;  Location: [place] CV LAB;  Service: Cardiovascular;  Laterality: Left;  . LOWER EXTREMITY ANGIOGRAPHY Left 08/02/2019   Procedure: LOWER EXTREMITY ANGIOGRAPHY;  Surgeon: Algernon Huxley, MD;  Location: [place] CV LAB;  Service: Cardiovascular;  Laterality: Left;  . LOWER EXTREMITY ANGIOGRAPHY Left 07/17/2020   Procedure: LOWER EXTREMITY ANGIOGRAPHY;  Surgeon: Algernon Huxley, MD;  Location: [place] CV LAB;  Service: Cardiovascular;  Laterality: Left;  . PERIPHERAL ARTERIAL STENT GRAFT     x2 left/right  . PERIPHERAL VASCULAR BALLOON ANGIOPLASTY Right 01/06/2018  Procedure: PERIPHERAL VASCULAR BALLOON ANGIOPLASTY;  Surgeon: Katha Cabal, MD;  Location: [place] CV LAB;  Service: Cardiovascular;  Laterality: Right;  . PERIPHERAL VASCULAR CATHETERIZATION N/A 07/06/2015   Procedure: Abdominal Aortogram w/Lower Extremity;  Surgeon: Algernon Huxley, MD;  Location: [place] CV LAB;  Service: Cardiovascular;  Laterality: N/A;  . PERIPHERAL VASCULAR CATHETERIZATION  07/06/2015   Procedure: Lower Extremity Intervention;  Surgeon: Algernon Huxley, MD;  Location: [place] CV LAB;  Service: Cardiovascular;;    Social History  reports that he has been smoking cigarettes. He has a 41.00 pack-year smoking history. He has never used smokeless tobacco. He reports that he does not drink alcohol and does not use drugs.  Allergies  Allergen Reactions  . Dulaglutide Anaphylaxis, Diarrhea and Hives  . Other Itching    Skin itching associated with nitro patch  . Prednisone Rash    Family History  Problem Relation Age of Onset  . Heart attack Father   . Heart disease Father    . Hypertension Father   . Hyperlipidemia Father   . Hypertension Mother   . Hyperlipidemia Mother   Reviewed on admission  Prior to Admission medications   Medication Sig Start Date End Date Taking? Authorizing Provider  albuterol (VENTOLIN HFA) 108 (90 Base) MCG/ACT inhaler Inhale into the lungs every 6 (six) hours as needed for wheezing or shortness of breath.    [provider]  atorvastatin (LIPITOR) 80 MG tablet Take 80 mg by mouth daily.    [provider]  carvedilol (COREG) 3.125 MG tablet Take 1 tablet (3.125 mg total) by mouth 2 (two) times daily with a meal. 07/01/20   Shawna Clamp, MD  clopidogrel (PLAVIX) 75 MG tablet Take 75 mg by mouth daily.  08/22/17   [provider]  digoxin (LANOXIN) 0.125 MG tablet Take 1 tablet (0.125 mg total) by mouth daily. 07/02/20   Shawna Clamp, MD  DULoxetine (CYMBALTA) 20 MG capsule Take 30 mg by mouth daily.     [provider]  ELIQUIS 5 MG TABS tablet Take 5 mg by mouth 2 (two) times daily. 02/02/20   [provider]  empagliflozin (JARDIANCE) 10 MG TABS tablet Take 10 mg by mouth daily.  02/03/20   [provider]  ezetimibe (ZETIA) 10 MG tablet Take 10 mg by mouth daily.    [provider]  gabapentin (NEURONTIN) 800 MG tablet Take 800 mg by mouth 4 (four) times daily.  08/14/17 04/04/29  [provider]  Ipratropium-Albuterol (COMBIVENT RESPIMAT) 20-100 MCG/ACT AERS respimat Inhale 1 puff into the lungs every 6 (six) hours as needed for wheezing.    [provider]  ipratropium-albuterol (DUONEB) 0.5-2.5 (3) MG/3ML SOLN Inhale 3 mLs into the lungs every 6 (six) hours as needed (wheezing/sob).     [provider]  isosorbide mononitrate (IMDUR) 30 MG 24 hr tablet Take 30 mg by mouth daily.    [provider]  levofloxacin (LEVAQUIN) 750 MG tablet Take 1 tablet (750 mg total) by mouth daily for 10 days. 08/03/20 08/13/20  Kris Hartmann, NP  Multiple  Vitamin (MULTI-VITAMINS) TABS Take 1 tablet by mouth daily.     [provider]  nitroGLYCERIN (NITROSTAT) 0.4 MG SL tablet Place 1 tablet (0.4 mg total) under the tongue every 5 (five) minutes as needed for chest pain. 05/19/19   Alisa Graff, FNP  omeprazole (PRILOSEC) 40 MG capsule Take 40 mg by mouth daily.    [provider]  Oxycodone HCl 10 MG TABS Take 10 mg by mouth every 6 (six) hours as needed (pain).  01/15/18   [provider]  potassium chloride (KLOR-CON) 10 MEQ tablet Take 10 mEq by mouth 2 (two) times daily.    [provider]  sodium chloride 1 g tablet Take 1 tablet (1 g total) by mouth 3 (three) times daily with meals. 06/12/20   Alisa Graff, FNP  spironolactone (ALDACTONE) 25 MG tablet Take 0.5 tablets (12.5 mg total) by mouth daily. 07/02/20   Shawna Clamp, MD  SYMBICORT 80-4.5 MCG/ACT inhaler Inhale 2 puffs into the lungs daily.  01/01/20   [provider]  tamsulosin (FLOMAX) 0.4 MG CAPS capsule Take 1 capsule (0.4 mg total) by mouth daily after supper. 07/01/20   Shawna Clamp, MD  torsemide (DEMADEX) 20 MG tablet Take 2 tablets (40 mg total) by mouth daily. 07/01/20   Shawna Clamp, MD  traZODone (DESYREL) 150 MG tablet Take 150 mg by mouth at bedtime. 07/12/19   [provider]  Vericiguat 5 MG TABS Take 5 mg by mouth daily.    [provider]    Physical Exam: Vitals:   08/05/20 1833 08/05/20 1840 08/05/20 2158 08/05/20 2304  BP: 102/62  109/68 111/67  Pulse: 85  77 69  Resp: 18  (!) 21 18  Temp: 98.7 F (37.1 C)     TempSrc: Oral     SpO2: 96%  97% 95%  Weight:  83 kg    Height:  5' 7.5" (1.715 m)     Physical Exam Constitutional:      General: He is not in acute distress.    Appearance: Normal appearance.  HENT:     Head: Normocephalic and atraumatic.     Mouth/Throat:     Mouth: Mucous membranes are moist.     Pharynx: Oropharynx is clear.  Eyes:     Extraocular Movements: Extraocular  movements intact.     Pupils: Pupils are equal, round, and reactive to light.  Cardiovascular:     Rate and Rhythm: Normal rate and regular rhythm.     Pulses: Normal pulses.     Heart sounds: Normal heart sounds.  Pulmonary:     Effort: Pulmonary effort is normal. No respiratory distress.     Breath sounds: Wheezing present.  Abdominal:     General: Bowel sounds are normal. There is no distension.     Palpations: Abdomen is soft.     Tenderness: There is no abdominal tenderness.  Musculoskeletal:        General: Deformity (Right BKA) present. No swelling.     Left lower leg: Edema present.  Skin:    General: Skin is warm and dry.     Comments: Left foot warmth and erythema, purulent drainage between first and second digit Left lower extremity stasis changes  Neurological:     General: No focal deficit present.     Mental Status: Mental status is at baseline.        Labs on Admission: I have personally reviewed following labs and imaging studies  CBC: Recent Labs  Lab 08/05/20 1841  WBC 7.5  NEUTROABS 6.1  HGB 12.4*  HCT 38.7*  MCV 86.4  PLT 143*    Basic Metabolic Panel: Recent Labs  Lab 08/05/20 1841  NA 135  K 4.0  CL 92*  CO2 31  GLUCOSE 185*  BUN 10  CREATININE 1.19  CALCIUM 9.1  MG 1.9  GFR: Estimated Creatinine Clearance: 71.3 mL/min (by C-G formula based on SCr of 1.19 mg/dL).  Liver Function Tests: Recent Labs  Lab 08/05/20 1841  AST 17  ALT 9  ALKPHOS 92  BILITOT 1.6*  PROT 6.7  ALBUMIN 3.6    Urine analysis:    Component Value Date/Time   COLORURINE STRAW (A) 09/10/2019 2028   APPEARANCEUR CLEAR (A) 09/10/2019 2028   APPEARANCEUR Clear 02/06/2014 1412   LABSPEC 1.003 (L) 09/10/2019 2028   LABSPEC 1.015 02/06/2014 1412   PHURINE 6.0 09/10/2019 2028   GLUCOSEU NEGATIVE 09/10/2019 2028   GLUCOSEU Negative 02/06/2014 1412   HGBUR NEGATIVE 09/10/2019 2028   BILIRUBINUR NEGATIVE 09/10/2019 2028   BILIRUBINUR Negative  02/06/2014 1412   KETONESUR NEGATIVE 09/10/2019 2028   PROTEINUR NEGATIVE 09/10/2019 2028   NITRITE NEGATIVE 09/10/2019 2028   LEUKOCYTESUR NEGATIVE 09/10/2019 2028   LEUKOCYTESUR Negative 02/06/2014 1412    Radiological Exams on Admission: DG Chest Portable 1 View  Result Date: 08/05/2020 CLINICAL DATA:  Shortness of breath. Swelling of the lower extremities. EXAM: PORTABLE CHEST 1 VIEW COMPARISON:  06/25/2020 FINDINGS: Cardiomegaly. Aortic atherosclerosis. Pulmonary venous hypertension with mild interstitial edema. No visible effusion. Findings consistent with congestive heart failure. Improved appearance compared to the study of 10 days ago. IMPRESSION: Congestive heart failure. Improved since the study of 10 days ago. Electronically Signed   By: Nelson Chimes M.D.   On: 08/05/2020 22:58   DG Foot Complete Left  Result Date: 08/05/2020 CLINICAL DATA:  Shortness of breath and foot swelling left foot laceration EXAM: LEFT FOOT - COMPLETE 3+ VIEW COMPARISON:  None. FINDINGS: There is no evidence of fracture or dislocation. There is no evidence of arthropathy or other focal bone abnormality. Dorsal soft tissue swelling is seen. Calcaneal enthesophytes are noted. IMPRESSION: No acute osseous abnormality. Electronically Signed   By: Prudencio Pair M.D.   On: 08/05/2020 23:03   EKG: Not yet obtained  Assessment/Plan Principal Problem:   Cellulitis Active Problems:   PAD (peripheral artery disease) (HCC)   HLD (hyperlipidemia)   Hypertension   Diabetic foot infection (HCC)   Chest pain   Type 2 diabetes mellitus with other specified complication (HCC)   COPD (chronic obstructive pulmonary disease) (HCC)   Chronic pain syndrome   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HCC)   CAD (coronary artery disease)   Persistent atrial fibrillation (HCC)   Chronic anticoagulation   CHF (congestive heart failure) (HCC)  Cellulitis > Patient with cellulitis after having small cuts to his  first and second digit.  Noted to have eschar on second digit.  Foot x-ray was without evidence of bony structure on read however the distal left second digit does appear to have some point distraction however this is not necessarily due to osteo-. > Patient does have a history of prior MRSA infection in his right lower extremity which ended up resulting in a right BKA.  Due to his cellulitis and a significant history he was started on broad-spectrum antibiotics to cover for diabetic foot wound and will be admitted for antibiotics overnight. > Currently no leukocytosis in ED. - Patient may benefit from a podiatry consult in the morning - Continue vancomycin and Zosyn for now - Monitor fever curve and white count  CHF HTN > BNP was elevated in the ED to 1000, this is down from 2000 one month ago and is the lowest reading he has had since 2020.  He states his edema is stable for him.  And his chest x-ray is improved from previous. > Significant other reports he ran out reduction 2 weeks ago needs a refill. - We will monitor I's and O's, daily weights, telemetry overnight - We will continue with home torsemide for now - We will also continue home potassium segmentation, Coreg, Imdur, spironolactone, digoxin, vericiguat - Trend renal function and electrolytes -Check digoxin level  Diabetes > On oral medications outpatient, glucose 25 in ED - SSI  CAD PAD Hyperlipidemia > Porting some mild chest pain in the ED > History of NSTEMI - Continue home Plavix, Coreg, atorvastatin - Trend troponins, check EKG  COPD > Trace wheezing in ED - Replace home Symbicort with formulary Dulera - As needed albuterol   Atrial fibrillation - Continue home Eliquis  Chronic pain Neuropathy - Continue home oxycodone, gabapentin, duloxetine  DVT prophylaxis: Eliquis Code Status:   Full  Family Communication:  None on admission Disposition Plan:   Patient is from:  Home  Anticipated DC  to:  Home  Anticipated DC date:  1 to 2 days  Anticipated DC barriers: None  Consults called:  None, may benefit from  Admission status:  Observation, MedSurg with telemetry   Severity of Illness: The appropriate patient status for this patient is OBSERVATION. Observation status is judged to be reasonable and necessary in order to provide the required intensity of service to ensure the patient's safety. The patient's presenting symptoms, physical exam findings, and initial radiographic and laboratory data in the context of their medical condition is felt to place them at decreased risk for further clinical deterioration. Furthermore, it is anticipated that the patient will be medically stable for discharge from the hospital within 2 midnights of admission. The following factors support the patient status of observation.   " The patient's presenting symptoms include left foot laceration, redness, swelling. " The physical exam findings include warmth and redness of left foot, purulent drainage between left first and second digit. " The initial radiographic and laboratory data are glucose 185, chest x-ray with signs of CHF improved from previous.Marcelyn Bruins MD [facility]  How to contact the [facility] Attending or Consulting provider [place] or covering provider during after hours [place], for this patient?   1. Check the care team in [facility] and look for a) attending/consulting TR[facility] provider listed and b) the [facility] team listed 2. Log into [email or web address] and use [place]'s universal password to access. If you do not have the password, please contact the hospital operator. 3. Locate the [facility] provider you are looking for under [facility] and page to a number that you can be directly reached. 4. If you still have difficulty reaching the provider, please page the [facility] (Director on Call) for the Hospitalists listed on amion for assistance.  08/06/2020, 12:17 AM

## 2020-08-05 NOTE — ED Triage Notes (Signed)
Pt via POV from home. Pt c/o L foot laceration. Pt c/o small cuts to the posterior aspect of the his L great toe and second toe. Cut to the L second toe noted to have some dead tissue. Pt is diabetic and has a hx of a R BKA from a previous MRSA infection. Pt also retaining fluid in his leg. Pt states that he has been on a fluid pill and per pt it doesn't look worse than it normally is.

## 2020-08-05 NOTE — Progress Notes (Signed)
PHARMACY -  BRIEF ANTIBIOTIC NOTE   Pharmacy has received consult(s) for Vancomycin from an ED provider.  The patient's profile has been reviewed for ht/wt/allergies/indication/available labs.    One time order(s) placed for Vancomycin 2gm based on pt wt 83 kg  Further antibiotics/pharmacy consults should be ordered by admitting physician if indicated.    Renda Rolls, PharmD, [facility] 08/05/2020 11:16 PM

## 2020-08-05 NOTE — ED Provider Notes (Signed)
[facility] Emergency Department Provider Note    Event Date/Time   First MD Initiated Contact with Patient 08/05/20 2219     (approximate)  I have reviewed the triage vital signs and the nursing notes.   HISTORY  Chief Complaint Foot Pain    HPI [name] [name] is a 60 y.o. 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  Does have a history of CHF.  No measured fevers.  No recent antibiotics.  Denies any recent trauma.  Has chronic numbness of his left leg.  Does have a history of MRSA infection.    Past Medical History:  Diagnosis Date  . Arthritis   . Asthma   . Atherosclerosis   . BPH (benign prostatic hyperplasia)   . Carotid arterial disease ([place])   . Charcot's joint of foot, right   . Chronic combined systolic (congestive) and diastolic (congestive) heart failure ([place])    a. 02/2013 EF 50% by LV gram; b. 04/2017 Echo: EF 25-30%. diff HK. Gr2 DD. Mod dil LA/RV. PASP 51mHg.  .Marland KitchenCOPD (chronic obstructive pulmonary disease) (H[place]asha   . Coronary artery disease    a. 2013 S/P PCI of LAD ([facility];  b. 03/2013 PCI: RCA 90p ( 2.5 x 23 mm DES); c. 02/2014 Cath: patent RCA stent->Med Rx; d. 04/2017 Cath: LM nl, LAd 30ost/p, 653m, D1 80ost, LCX 90p/m, OM1 85, RCA 70/20p, 8018m0d->Referred for CT Surg-felt to be poor candidate.  . Diabetic neuropathy (HCCAmite . Diabetic ulcer of right foot (HCC[place] . Hernia   . Hyperlipidemia   . Hypertension   . Ischemic cardiomyopathy    a. 04/2017 Echo: EF 25-30%; b. 08/2017 Cardiac MRI ([facility]): EF 19%, sev glob HK. RVEF 20%, mod BAE, triv MR, mild-mod TR. Basal lateral subendocardial infarct (viable), inf/infsept, dist septal ischemia, basal to mid lat peri-infarct ischemia.  . Morbid obesity (HCCBirmingham . Neuropathy   . PAD (peripheral artery disease) (HCCCalver[place]  a. Followed  by Dr. DewLucky Cowboy. 08/2010 Periph Angio: RSFA 70-80p (6X60 self-expanding stent); c. 09/2010 Periph Angio: L SFA 100p (7x unknown length self-expanding stent); d. 06/2015 Periph Angio: R SFA short segment occlusion (6x12 self-expanding stent).  . Restless leg syndrome   . Sleep apnea   . Subclavian artery stenosis, right (HCCLake St. Louis . Syncope and collapse   . Tobacco use    a. 75+ yr hx - still smoking 1ppd, down from 2 ppd.  . Type II diabetes mellitus (HCCLewis[place] . Varicose vein    Family History  Problem Relation Age of Onset  . Heart attack Father   . Heart disease Father   . Hypertension Father   . Hyperlipidemia Father   . Hypertension Mother   . Hyperlipidemia Mother    Past Surgical History:  Procedure Laterality Date  . ABDOMINAL AORTAGRAM N/A 06/23/2013   Procedure: ABDOMINAL AORMaxcine HamSurgeon: MuhWellington HampshireD;  [facility];  Service: Cardiovascular;  Laterality: N/A;  . AMPUTATION Right 04/01/2018   Procedure: AMPUTATION BELOW KNEE;  Surgeon: DewAlgernon HuxleyD;  Location: [facility];  Service: Vascular;  Laterality: Right;  . APPENDECTOMY    . CARDIAC CATHETERIZATION  10/14   [facility]; X1 STENT PROXIMAL RCA  . CARDIAC CATHETERIZATION  02/2010   [facility]  . CARDIAC  CATHETERIZATION  02/21/2014   [facility]  . CLAVICLE SURGERY    . HERNIA REPAIR    . IRRIGATION AND DEBRIDEMENT FOOT Right 01/04/2018   Procedure: IRRIGATION AND DEBRIDEMENT FOOT;  Surgeon: Samara Deist, DPM;  Location: [facility] OR[facility];  Service: Podiatry;  Laterality: Right;  . LEFT HEART CATH AND CORONARY ANGIOGRAPHY N/A 01/20/2020   Procedure: LEFT HEART CATH AND CORONARY ANGIOGRAPHY;  Surgeon: Wellington Hampshire, MD;  Location: [place] [facility];  Service: Cardiovascular;  Laterality: N/A;  . LEFT HEART CATH AND CORS/GRAFTS ANGIOGRAPHY N/A 04/28/2017   Procedure: LEFT HEART CATH AND CORONARY ANGIOGRAPHY;  Surgeon: Wellington Hampshire, MD;  Location: [place] [facility];  Service: Cardiovascular;  Laterality: N/A;  . LOWER  EXTREMITY ANGIOGRAPHY Right 03/03/2018   Procedure: LOWER EXTREMITY ANGIOGRAPHY;  Surgeon: Katha Cabal, MD;  Location: [place] [facility];  Service: Cardiovascular;  Laterality: Right;  . LOWER EXTREMITY ANGIOGRAPHY Left 08/24/2018   Procedure: LOWER EXTREMITY ANGIOGRAPHY;  Surgeon: Algernon Huxley, MD;  Location: [place] [facility];  Service: Cardiovascular;  Laterality: Left;  . LOWER EXTREMITY ANGIOGRAPHY Left 06/14/2019   Procedure: LOWER EXTREMITY ANGIOGRAPHY;  Surgeon: Algernon Huxley, MD;  Location: [place] [facility];  Service: Cardiovascular;  Laterality: Left;  . LOWER EXTREMITY ANGIOGRAPHY Left 08/02/2019   Procedure: LOWER EXTREMITY ANGIOGRAPHY;  Surgeon: Algernon Huxley, MD;  Location: [place] [facility];  Service: Cardiovascular;  Laterality: Left;  . LOWER EXTREMITY ANGIOGRAPHY Left 07/17/2020   Procedure: LOWER EXTREMITY ANGIOGRAPHY;  Surgeon: Algernon Huxley, MD;  Location: [place] [facility];  Service: Cardiovascular;  Laterality: Left;  . PERIPHERAL ARTERIAL STENT GRAFT     x2 left/right  . PERIPHERAL VASCULAR BALLOON ANGIOPLASTY Right 01/06/2018   Procedure: PERIPHERAL VASCULAR BALLOON ANGIOPLASTY;  Surgeon: Katha Cabal, MD;  Location: [place] [facility];  Service: Cardiovascular;  Laterality: Right;  . PERIPHERAL VASCULAR CATHETERIZATION N/A 07/06/2015   Procedure: Abdominal Aortogram w/Lower Extremity;  Surgeon: Algernon Huxley, MD;  Location: [place] [facility];  Service: Cardiovascular;  Laterality: N/A;  . PERIPHERAL VASCULAR CATHETERIZATION  07/06/2015   Procedure: Lower Extremity Intervention;  Surgeon: Algernon Huxley, MD;  Location: [place] [facility];  Service: Cardiovascular;;   Patient Active Problem List   Diagnosis Date Noted  . Palliative care by specialist   . Acute on chronic heart failure ([place])   . CHF (congestive heart failure) ([place]) 06/18/2020  . CHF exacerbation ([place]) 06/17/2020  . Chronic anticoagulation 06/17/2020  . Chronic venous insufficiency  02/10/2020  . HFrEF (heart failure with reduced ejection fraction) ([place]) 01/30/2020  . COPD with acute exacerbation ([place]) 01/30/2020  . Persistent atrial fibrillation ([place]) 01/19/2020  . Chronic bilateral low back pain with sciatica 10/08/2019  . Medication management contract signed 10/08/2019  . Cellulitis of left lower leg 09/27/2019  . Cellulitis of foot, left 09/27/2019  . Chronic hyponatremia 09/27/2019  . Cellulitis of left leg 08/31/2019  . CAD (coronary artery disease) 08/31/2019  . Acute on chronic systolic CHF (congestive heart failure) ([place]) 11/05/2018  . Open toe wound 10/01/2018  . Venous ulcer of left leg ([place]) 08/19/2018  . 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[place]) 04/28/2018  . Sepsis ([place]) 03/30/2018  . Abnormal MRI, shoulder (Right) 02/23/2018  . Abnormal MRI, cervical spine (2016) 02/23/2018  . DDD (degenerative disc disease), cervical 02/23/2018  . Cervical foraminal stenosis (Bilateral) 02/23/2018  . Cervical central spinal stenosis 02/23/2018  . Cervical facet hypertrophy 02/23/2018  .  Arthralgia of acromioclavicular joint (Right) 02/23/2018  . Osteoarthritis of  AC (acromioclavicular) joint (Right) 02/23/2018  . Biceps tendinosis of shoulder (Right) 02/23/2018  . Tendinopathy of rotator cuff (Right) 02/23/2018  . Vitamin D deficiency 02/23/2018  . Atherosclerosis of native arteries of the extremities with ulceration ([place]) 02/15/2018  . Peripheral edema 02/05/2018  . Status post peripherally inserted central catheter (PICC) central line placement 02/04/2018  . Chronic lower extremity pain (Primary Area of Pain) (Right) 01/29/2018  . Chronic shoulder pain [facility] Area of Pain) (Right) 01/29/2018  . Chronic foot pain (Secondary Area of Pain) (Right) 01/29/2018  . Chronic pain syndrome 01/29/2018  . Long term current use of opiate analgesic 01/29/2018  . Medication monitoring encounter 01/29/2018  . Disorder of skeletal system 01/29/2018  .  Problems influencing health status 01/29/2018  . AKI (acute kidney injury) ([place]) 01/03/2018  . NSTEMI (non-ST elevated myocardial infarction) ([place]) 11/21/2017  . Coronary artery disease of native artery of native heart with stable angina pectoris ([place]) 09/10/2017  . HLD (hyperlipidemia) 09/10/2017  . Angina pectoris ([place]) 09/08/2017  . Angina at rest [facility]) 08/13/2017  . Lymphedema 05/02/2017  . Chronic systolic CHF (congestive heart failure) ([place]) 04/23/2017  . Chest pain 07/11/2016  . Unstable angina ([place]) 07/11/2016  . Foot ulcer (Right)   . Stable angina (HCC)   . Pressure injury of skin 05/06/2016  . Diabetic foot infection ([place]) 05/05/2016  . Type 2 diabetes mellitus with other specified complication ([place]) 99991111  . Cellulitis 11/21/2015  . Morbid obesity ([place]) 11/17/2014  . PAD (peripheral artery disease) ([place])   . Hypertension   . Tobacco use   . COPD (chronic obstructive pulmonary disease) ([place]) 05/19/2012  . Diabetic neuropathy ([place]) 05/19/2012  . Peripheral vascular disease ([place]) 03/27/2012      Prior to Admission medications   Medication Sig Start Date End Date Taking? Authorizing Provider  albuterol (VENTOLIN HFA) 108 (90 Base) MCG/ACT inhaler Inhale into the lungs every 6 (six) hours as needed for wheezing or shortness of breath.    [provider]  atorvastatin (LIPITOR) 80 MG tablet Take 80 mg by mouth daily.    [provider]  carvedilol (COREG) 3.125 MG tablet Take 1 tablet (3.125 mg total) by mouth 2 (two) times daily with a meal. 07/01/20   Shawna Clamp, MD  clopidogrel (PLAVIX) 75 MG tablet Take 75 mg by mouth daily.  08/22/17   [provider]  digoxin (LANOXIN) 0.125 MG tablet Take 1 tablet (0.125 mg total) by mouth daily. 07/02/20   Shawna Clamp, MD  DULoxetine (CYMBALTA) 20 MG capsule Take 30 mg by mouth daily.     [provider]  ELIQUIS 5 MG TABS tablet Take 5 mg by mouth 2 (two) times daily. 02/02/20   [provider]  empagliflozin (JARDIANCE) 10 MG TABS tablet Take 10 mg by mouth daily.  02/03/20   [provider]  ezetimibe (ZETIA) 10 MG tablet Take 10 mg by mouth daily.    [provider]  gabapentin (NEURONTIN) 800 MG tablet Take 800 mg by mouth 4 (four) times daily.  08/14/17 04/04/29  [provider]  Ipratropium-Albuterol (COMBIVENT RESPIMAT) 20-100 MCG/ACT AERS respimat Inhale 1 puff into the lungs every 6 (six) hours as needed for wheezing.    [provider]  ipratropium-albuterol (DUONEB) 0.5-2.5 (3) MG/3ML SOLN Inhale 3 mLs into the lungs every 6 (six) hours as needed (wheezing/sob).     [provider]  isosorbide mononitrate (IMDUR)  30 MG 24 hr tablet Take 30 mg by mouth daily.    [provider]  levofloxacin (LEVAQUIN) 750 MG tablet Take 1 tablet (750 mg total) by mouth daily for 10 days. 08/03/20 08/13/20  Kris Hartmann, NP  Multiple Vitamin (MULTI-VITAMINS) TABS Take 1 tablet by mouth daily.     [provider]  nitroGLYCERIN (NITROSTAT) 0.4 MG SL tablet Place 1 tablet (0.4 mg total) under the tongue every 5 (five) minutes as needed for chest pain. 05/19/19   Alisa Graff, FNP  omeprazole (PRILOSEC) 40 MG capsule Take 40 mg by mouth daily.    [provider]  Oxycodone HCl 10 MG TABS Take 10 mg by mouth every 6 (six) hours as needed (pain).  01/15/18   [provider]  potassium chloride (KLOR-CON) 10 MEQ tablet Take 10 mEq by mouth 2 (two) times daily.    [provider]  sodium chloride 1 g tablet Take 1 tablet (1 g total) by mouth 3 (three) times daily with meals. 06/12/20   Alisa Graff, FNP  spironolactone (ALDACTONE) 25 MG tablet Take 0.5 tablets (12.5 mg total) by mouth daily. 07/02/20   Shawna Clamp, MD  SYMBICORT 80-4.5 MCG/ACT inhaler Inhale 2 puffs into the lungs daily.  01/01/20   [provider]  tamsulosin (FLOMAX) 0.4 MG CAPS capsule Take 1 capsule (0.4 mg total) by mouth  daily after supper. 07/01/20   Shawna Clamp, MD  torsemide (DEMADEX) 20 MG tablet Take 2 tablets (40 mg total) by mouth daily. 07/01/20   Shawna Clamp, MD  traZODone (DESYREL) 150 MG tablet Take 150 mg by mouth at bedtime. 07/12/19   [provider]  Vericiguat 5 MG TABS Take 5 mg by mouth daily.    [provider]    Allergies Dulaglutide, Other, and Prednisone    Social History Social History   Tobacco Use  . Smoking status: Current Every Day Smoker    Packs/day: 1.00    Years: 41.00    Pack years: 41.00    Types: Cigarettes  . Smokeless tobacco: Never Used  Vaping Use  . Vaping Use: Never used  Substance Use Topics  . Alcohol use: No  . Drug use: No    Review of Systems Patient denies headaches, rhinorrhea, blurry vision, numbness, shortness of breath, chest pain, edema, cough, abdominal pain, nausea, vomiting, diarrhea, dysuria, fevers, rashes or hallucinations unless otherwise stated above in HPI. ____________________________________________   PHYSICAL EXAM:  VITAL SIGNS: Vitals:   08/05/20 2158 08/05/20 2304  BP: 109/68 111/67  Pulse: 77 69  Resp: (!) 21 18  Temp:    SpO2: 97% 95%    Constitutional: Alert and oriented.  Eyes: Conjunctivae are normal.  Head: Atraumatic. Nose: No congestion/rhinnorhea. Mouth/Throat: Mucous membranes are moist.   Neck: No stridor. Painless ROM.  Cardiovascular: Normal rate, regular rhythm. Grossly normal heart sounds.  Good peripheral circulation. Respiratory: Normal respiratory effort.  No retractions. Lungs CTAB. Gastrointestinal: Soft and nontender. No distention. No abdominal bruits. No CVA tenderness. Genitourinary:  Musculoskeletal: Status post right BKA, left foot with some early gangrenous changes to the distal aspect of the second digit with discoloration and purulent drainage coming from between the great toe.  Does have cellulitic changes and warmth more proximally up the leg with some swelling.   Has some chronic venous ulcerations.  No joint effusions. Neurologic:  Normal speech and language. No gross focal neurologic deficits are appreciated. No facial droop Skin:  Skin is warm,  dry and intact. No rash noted. Psychiatric: Mood and affect are normal. Speech and behavior are normal.  ____________________________________________   LABS (all labs ordered are listed, but only abnormal results are displayed)  Results for orders placed or performed during the hospital encounter of 08/05/20 (from the past 24 hour(s))  Lactic acid, plasma     Status: None   Collection Time: 08/05/20  6:41 PM  Result Value Ref Range   Lactic Acid, Venous 1.3 0.5 - 1.9 mmol/L  Comprehensive metabolic panel     Status: Abnormal   Collection Time: 08/05/20  6:41 PM  Result Value Ref Range   Sodium 135 135 - 145 mmol/L   Potassium 4.0 3.5 - 5.1 mmol/L   Chloride 92 (L) 98 - 111 mmol/L   CO2 31 22 - 32 mmol/L   Glucose, Bld 185 (H) 70 - 99 mg/dL   BUN 10 6 - 20 mg/dL   Creatinine, Ser 1.19 0.61 - 1.24 mg/dL   Calcium 9.1 8.9 - 10.3 mg/dL   Total Protein 6.7 6.5 - 8.1 g/dL   Albumin 3.6 3.5 - 5.0 g/dL   AST 17 15 - 41 U/L   ALT 9 0 - 44 U/L   Alkaline Phosphatase 92 38 - 126 U/L   Total Bilirubin 1.6 (H) 0.3 - 1.2 mg/dL   GFR, Estimated >60 >60 mL/min   Anion gap 12 5 - 15  CBC with Differential     Status: Abnormal   Collection Time: 08/05/20  6:41 PM  Result Value Ref Range   WBC 7.5 4.0 - 10.5 K/uL   RBC 4.48 4.22 - 5.81 MIL/uL   Hemoglobin 12.4 (L) 13.0 - 17.0 g/dL   HCT 38.7 (L) 39.0 - 52.0 %   MCV 86.4 80.0 - 100.0 fL   MCH 27.7 26.0 - 34.0 pg   MCHC 32.0 30.0 - 36.0 g/dL   RDW 21.2 (H) 11.5 - 15.5 %   Platelets 143 (L) 150 - 400 K/uL   nRBC 0.0 0.0 - 0.2 %   Neutrophils Relative % 79 %   Neutro Abs 6.1 1.7 - 7.7 K/uL   Lymphocytes Relative 10 %   Lymphs Abs 0.8 0.7 - 4.0 K/uL   Monocytes Relative 8 %   Monocytes Absolute 0.6 0.1 - 1.0 K/uL   Eosinophils Relative 1 %    Eosinophils Absolute 0.1 0.0 - 0.5 K/uL   Basophils Relative 1 %   Basophils Absolute 0.1 0.0 - 0.1 K/uL   WBC Morphology MORPHOLOGY UNREMARKABLE    RBC Morphology MORPHOLOGY UNREMARKABLE    Smear Review Normal platelet morphology    Immature Granulocytes 1 %   Abs Immature Granulocytes 0.05 0.00 - 0.07 K/uL  Brain natriuretic peptide     Status: Abnormal   Collection Time: 08/05/20  6:44 PM  Result Value Ref Range   B Natriuretic Peptide 1,069.1 (H) 0.0 - 100.0 pg/mL   ____________________________________________ ____________________________________________  RADIOLOGY  I personally reviewed all radiographic images ordered to evaluate for the above acute complaints and reviewed radiology reports and findings.  These findings were personally discussed with the patient.  Please see medical record for radiology report.  ____________________________________________   PROCEDURES  Procedure(s) performed:  Procedures    Critical Care performed: no ____________________________________________   INITIAL IMPRESSION / ASSESSMENT AND PLAN / ED COURSE  Pertinent labs & imaging results that were available during my care of the patient were reviewed by me and considered in my medical decision making (see chart for  details).   DDX: Diabetic foot wound, osteo-, venous stasis, CHF  [name] [name] is a 60 y.o. who presents to the ED with presentation as described above.  Patient nontoxic-appearing but has evidence of diabetic foot wound and ulceration.  Given cellulitic changes history of previous amputation secondary to diabetic foot wound will order antibiotics.  X-ray of foot read as normal by radiology but area in question the distal aspect of the second digit appears to have some osteolytic changes concerning for early osteo-.  will discuss with hospitalist for admission.     The patient was evaluated in Emergency Department today for the symptoms described in the history of present  illness. He/she was evaluated in the context of the global COVID-19 pandemic, which necessitated consideration that the patient might be at risk for infection with the SARS-CoV-2 virus that causes COVID-19. Institutional protocols and algorithms that pertain to the evaluation of patients at risk for COVID-19 are in a state of rapid change based on information released by regulatory bodies including the CDC and federal and state organizations. These policies and algorithms were followed during the patient's care in the ED.  As part of my medical decision making, I reviewed the following data within the [facility] notes reviewed and incorporated, Labs reviewed, notes from prior ED visits and [place] Controlled Substance Database   ____________________________________________   FINAL CLINICAL IMPRESSION(S) / ED DIAGNOSES  Final diagnoses:  Diabetic foot infection ([place])      NEW MEDICATIONS STARTED DURING THIS VISIT:  New Prescriptions   No medications on file     Note:  This document was prepared using Dragon voice recognition software and may include unintentional dictation errors.    Merlyn Lot, MD 08/05/20 [phone]

## 2020-08-06 ENCOUNTER — Encounter: Payer: Self-pay | Admitting: Internal Medicine

## 2020-08-06 DIAGNOSIS — L03116 Cellulitis of left lower limb: Secondary | ICD-10-CM

## 2020-08-06 DIAGNOSIS — E785 Hyperlipidemia, unspecified: Secondary | ICD-10-CM

## 2020-08-06 DIAGNOSIS — Z955 Presence of coronary angioplasty implant and graft: Secondary | ICD-10-CM | POA: Diagnosis not present

## 2020-08-06 DIAGNOSIS — Z7902 Long term (current) use of antithrombotics/antiplatelets: Secondary | ICD-10-CM | POA: Diagnosis not present

## 2020-08-06 DIAGNOSIS — I4821 Permanent atrial fibrillation: Secondary | ICD-10-CM | POA: Diagnosis present

## 2020-08-06 DIAGNOSIS — E11622 Type 2 diabetes mellitus with other skin ulcer: Secondary | ICD-10-CM | POA: Diagnosis present

## 2020-08-06 DIAGNOSIS — I4819 Other persistent atrial fibrillation: Secondary | ICD-10-CM | POA: Diagnosis not present

## 2020-08-06 DIAGNOSIS — Z720 Tobacco use: Secondary | ICD-10-CM

## 2020-08-06 DIAGNOSIS — Z7951 Long term (current) use of inhaled steroids: Secondary | ICD-10-CM | POA: Diagnosis not present

## 2020-08-06 DIAGNOSIS — I5023 Acute on chronic systolic (congestive) heart failure: Secondary | ICD-10-CM | POA: Diagnosis not present

## 2020-08-06 DIAGNOSIS — E1169 Type 2 diabetes mellitus with other specified complication: Secondary | ICD-10-CM | POA: Diagnosis present

## 2020-08-06 DIAGNOSIS — I214 Non-ST elevation (NSTEMI) myocardial infarction: Secondary | ICD-10-CM

## 2020-08-06 DIAGNOSIS — S88111A Complete traumatic amputation at level between knee and ankle, right lower leg, initial encounter: Secondary | ICD-10-CM | POA: Diagnosis not present

## 2020-08-06 DIAGNOSIS — G2581 Restless legs syndrome: Secondary | ICD-10-CM | POA: Diagnosis present

## 2020-08-06 DIAGNOSIS — M869 Osteomyelitis, unspecified: Secondary | ICD-10-CM | POA: Diagnosis present

## 2020-08-06 DIAGNOSIS — I248 Other forms of acute ischemic heart disease: Secondary | ICD-10-CM | POA: Diagnosis not present

## 2020-08-06 DIAGNOSIS — I25118 Atherosclerotic heart disease of native coronary artery with other forms of angina pectoris: Secondary | ICD-10-CM

## 2020-08-06 DIAGNOSIS — E11628 Type 2 diabetes mellitus with other skin complications: Secondary | ICD-10-CM | POA: Diagnosis not present

## 2020-08-06 DIAGNOSIS — I739 Peripheral vascular disease, unspecified: Secondary | ICD-10-CM

## 2020-08-06 DIAGNOSIS — M86172 Other acute osteomyelitis, left ankle and foot: Secondary | ICD-10-CM | POA: Diagnosis not present

## 2020-08-06 DIAGNOSIS — L97529 Non-pressure chronic ulcer of other part of left foot with unspecified severity: Secondary | ICD-10-CM | POA: Diagnosis present

## 2020-08-06 DIAGNOSIS — I472 Ventricular tachycardia: Secondary | ICD-10-CM | POA: Diagnosis present

## 2020-08-06 DIAGNOSIS — I5043 Acute on chronic combined systolic (congestive) and diastolic (congestive) heart failure: Secondary | ICD-10-CM | POA: Diagnosis present

## 2020-08-06 DIAGNOSIS — Z7984 Long term (current) use of oral hypoglycemic drugs: Secondary | ICD-10-CM | POA: Diagnosis not present

## 2020-08-06 DIAGNOSIS — I5021 Acute systolic (congestive) heart failure: Secondary | ICD-10-CM | POA: Diagnosis not present

## 2020-08-06 DIAGNOSIS — I10 Essential (primary) hypertension: Secondary | ICD-10-CM

## 2020-08-06 DIAGNOSIS — J449 Chronic obstructive pulmonary disease, unspecified: Secondary | ICD-10-CM

## 2020-08-06 DIAGNOSIS — I5020 Unspecified systolic (congestive) heart failure: Secondary | ICD-10-CM | POA: Diagnosis not present

## 2020-08-06 DIAGNOSIS — F17210 Nicotine dependence, cigarettes, uncomplicated: Secondary | ICD-10-CM | POA: Diagnosis present

## 2020-08-06 DIAGNOSIS — I2510 Atherosclerotic heart disease of native coronary artery without angina pectoris: Secondary | ICD-10-CM | POA: Diagnosis present

## 2020-08-06 DIAGNOSIS — Z20822 Contact with and (suspected) exposure to covid-19: Secondary | ICD-10-CM | POA: Diagnosis present

## 2020-08-06 DIAGNOSIS — Z89511 Acquired absence of right leg below knee: Secondary | ICD-10-CM | POA: Diagnosis not present

## 2020-08-06 DIAGNOSIS — I255 Ischemic cardiomyopathy: Secondary | ICD-10-CM | POA: Diagnosis present

## 2020-08-06 DIAGNOSIS — E1151 Type 2 diabetes mellitus with diabetic peripheral angiopathy without gangrene: Secondary | ICD-10-CM | POA: Diagnosis present

## 2020-08-06 DIAGNOSIS — E1140 Type 2 diabetes mellitus with diabetic neuropathy, unspecified: Secondary | ICD-10-CM | POA: Diagnosis present

## 2020-08-06 DIAGNOSIS — R079 Chest pain, unspecified: Secondary | ICD-10-CM | POA: Diagnosis not present

## 2020-08-06 DIAGNOSIS — Z7901 Long term (current) use of anticoagulants: Secondary | ICD-10-CM | POA: Diagnosis not present

## 2020-08-06 DIAGNOSIS — I482 Chronic atrial fibrillation, unspecified: Secondary | ICD-10-CM | POA: Diagnosis not present

## 2020-08-06 LAB — GLUCOSE, CAPILLARY
Glucose-Capillary: 87 mg/dL (ref 70–99)
Glucose-Capillary: 134 mg/dL — ABNORMAL HIGH (ref 70–99)
Glucose-Capillary: 113 mg/dL — ABNORMAL HIGH (ref 70–99)
Glucose-Capillary: 111 mg/dL — ABNORMAL HIGH (ref 70–99)
Glucose-Capillary: 102 mg/dL — ABNORMAL HIGH (ref 70–99)

## 2020-08-06 LAB — COMPREHENSIVE METABOLIC PANEL
ALT: 9 U/L (ref 0–44)
AST: 33 U/L (ref 15–41)
Albumin: 3.1 g/dL — ABNORMAL LOW (ref 3.5–5.0)
Alkaline Phosphatase: 78 U/L (ref 38–126)
Anion gap: 7 (ref 5–15)
BUN: 10 mg/dL (ref 6–20)
CO2: 32 mmol/L (ref 22–32)
Calcium: 8.8 mg/dL — ABNORMAL LOW (ref 8.9–10.3)
Chloride: 96 mmol/L — ABNORMAL LOW (ref 98–111)
Creatinine, Ser: 1.09 mg/dL (ref 0.61–1.24)
GFR, Estimated: >60 mL/min (ref >60)
Glucose, Bld: 97 mg/dL (ref 70–99)
Potassium: 4.2 mmol/L (ref 3.5–5.1)
Sodium: 135 mmol/L (ref 135–145)
Total Bilirubin: 1.6 mg/dL — ABNORMAL HIGH (ref 0.3–1.2)
Total Protein: 6.2 g/dL — ABNORMAL LOW (ref 6.5–8.1)

## 2020-08-06 LAB — C-REACTIVE PROTEIN: CRP: 0.7 mg/dL (ref <1.0)

## 2020-08-06 LAB — CBC
HCT: 35.1 % — ABNORMAL LOW (ref 39.0–52.0)
Hemoglobin: 11.4 g/dL — ABNORMAL LOW (ref 13.0–17.0)
MCH: 28.1 pg (ref 26.0–34.0)
MCHC: 32.5 g/dL (ref 30.0–36.0)
MCV: 86.5 fL (ref 80.0–100.0)
Platelets: 132 10*3/uL — ABNORMAL LOW (ref 150–400)
RBC: 4.06 MIL/uL — ABNORMAL LOW (ref 4.22–5.81)
RDW: 21 % — ABNORMAL HIGH (ref 11.5–15.5)
WBC: 6.4 10*3/uL (ref 4.0–10.5)
nRBC: 0 % (ref 0.0–0.2)

## 2020-08-06 LAB — HEPARIN LEVEL (UNFRACTIONATED): Heparin Unfractionated: >2.2 IU/mL — ABNORMAL HIGH (ref 0.30–0.70)

## 2020-08-06 LAB — TROPONIN I (HIGH SENSITIVITY)
Troponin I (High Sensitivity): 652 ng/L (ref <18)
Troponin I (High Sensitivity): 863 ng/L (ref <18)
Troponin I (High Sensitivity): 1190 ng/L (ref <18)
Troponin I (High Sensitivity): 1186 ng/L (ref <18)

## 2020-08-06 LAB — APTT
aPTT: 37 seconds — ABNORMAL HIGH (ref 24–36)
aPTT: 60 seconds — ABNORMAL HIGH (ref 24–36)

## 2020-08-06 LAB — BILIRUBIN, FRACTIONATED(TOT/DIR/INDIR)
Bilirubin, Direct: 0.4 mg/dL — ABNORMAL HIGH (ref 0.0–0.2)
Indirect Bilirubin: 1.2 mg/dL — ABNORMAL HIGH (ref 0.3–0.9)
Total Bilirubin: 1.6 mg/dL — ABNORMAL HIGH (ref 0.3–1.2)

## 2020-08-06 LAB — HEMOGLOBIN A1C
Hgb A1c MFr Bld: 6.5 % — ABNORMAL HIGH (ref 4.8–5.6)
Mean Plasma Glucose: 139.85 mg/dL

## 2020-08-06 LAB — DIGOXIN LEVEL: Digoxin Level: 0.9 ng/mL (ref 0.8–2.0)

## 2020-08-06 LAB — PROTIME-INR
INR: 1.4 — ABNORMAL HIGH (ref 0.8–1.2)
Prothrombin Time: 16.5 seconds — ABNORMAL HIGH (ref 11.4–15.2)

## 2020-08-06 LAB — SARS CORONAVIRUS 2 (TAT 6-24 HRS): SARS Coronavirus 2: NEGATIVE

## 2020-08-06 LAB — MAGNESIUM: Magnesium: 1.9 mg/dL (ref 1.7–2.4)

## 2020-08-06 MED ORDER — EZETIMIBE 10 MG PO TABS
10.0000 mg | ORAL_TABLET | Freq: Every day | ORAL | Status: DC
Start: 2020-08-06 — End: 2020-08-12
  Administered 2020-08-06 – 2020-08-12 (×7): 10 mg via ORAL
  Filled 2020-08-06 (×7): qty 1

## 2020-08-06 MED ORDER — HEPARIN (PORCINE) 25000 UT/250ML-% IV SOLN
1600.0000 [IU]/h | INTRAVENOUS | Status: DC
  Administered 2020-08-07: 1150 [IU]/h via INTRAVENOUS
  Administered 2020-08-08 – 2020-08-09 (×2): 1450 [IU]/h via INTRAVENOUS
  Filled 2020-08-06 (×3): qty 250

## 2020-08-06 MED ORDER — TRAZODONE HCL 50 MG PO TABS
150.0000 mg | ORAL_TABLET | Freq: Every day | ORAL | Status: DC
  Administered 2020-08-06 – 2020-08-11 (×7): 150 mg via ORAL
  Filled 2020-08-06 (×8): qty 1

## 2020-08-06 MED ORDER — HEPARIN BOLUS VIA INFUSION
2000.0000 [IU] | Freq: Once | INTRAVENOUS | Status: AC
  Administered 2020-08-06: 2000 [IU] via INTRAVENOUS
  Filled 2020-08-06: qty 2000

## 2020-08-06 MED ORDER — HEPARIN BOLUS VIA INFUSION
1200.0000 [IU] | Freq: Once | INTRAVENOUS | Status: AC
  Administered 2020-08-07: 1200 [IU] via INTRAVENOUS
  Filled 2020-08-06: qty 1200

## 2020-08-06 MED ORDER — FUROSEMIDE 10 MG/ML IJ SOLN
40.0000 mg | Freq: Two times a day (BID) | INTRAMUSCULAR | Status: DC
  Administered 2020-08-06 – 2020-08-09 (×6): 40 mg via INTRAVENOUS
  Filled 2020-08-06 (×6): qty 4

## 2020-08-06 MED ORDER — VANCOMYCIN HCL 1000 MG/200ML IV SOLN
1000.0000 mg | Freq: Two times a day (BID) | INTRAVENOUS | Status: DC
  Administered 2020-08-06 – 2020-08-07 (×3): 1000 mg via INTRAVENOUS
  Filled 2020-08-06 (×4): qty 200

## 2020-08-06 MED ORDER — ASPIRIN 81 MG PO CHEW
81.0000 mg | CHEWABLE_TABLET | Freq: Every day | ORAL | Status: DC
  Administered 2020-08-06 – 2020-08-09 (×4): 81 mg via ORAL
  Filled 2020-08-06 (×4): qty 1

## 2020-08-06 MED ORDER — INSULIN ASPART 100 UNIT/ML ~~LOC~~ SOLN
0.0000 [IU] | Freq: Three times a day (TID) | SUBCUTANEOUS | Status: DC
  Administered 2020-08-07: 3 [IU] via SUBCUTANEOUS
  Administered 2020-08-07 – 2020-08-08 (×2): 2 [IU] via SUBCUTANEOUS
  Administered 2020-08-08 – 2020-08-09 (×3): 3 [IU] via SUBCUTANEOUS
  Administered 2020-08-10: 2 [IU] via SUBCUTANEOUS
  Administered 2020-08-10 – 2020-08-11 (×2): 3 [IU] via SUBCUTANEOUS
  Filled 2020-08-06 (×9): qty 1

## 2020-08-06 MED ORDER — VERICIGUAT 5 MG PO TABS
5.0000 mg | ORAL_TABLET | Freq: Every day | ORAL | Status: DC
Start: 2020-08-06 — End: 2020-08-12

## 2020-08-06 MED ORDER — PIPERACILLIN-TAZOBACTAM 3.375 G IVPB
3.3750 g | Freq: Three times a day (TID) | INTRAVENOUS | Status: DC
  Administered 2020-08-06 – 2020-08-12 (×18): 3.375 g via INTRAVENOUS
  Filled 2020-08-06 (×17): qty 50

## 2020-08-06 MED ORDER — VANCOMYCIN HCL 1750 MG/350ML IV SOLN
1750.0000 mg | INTRAVENOUS | Status: DC
  Filled 2020-08-06: qty 350

## 2020-08-06 MED ORDER — HEPARIN (PORCINE) 25000 UT/250ML-% IV SOLN
1000.0000 [IU]/h | INTRAVENOUS | Status: DC
  Administered 2020-08-06: 1000 [IU]/h via INTRAVENOUS
  Filled 2020-08-06: qty 250

## 2020-08-06 NOTE — Consult Note (Addendum)
Cardiology Consultation:   Patient ID: [name] MRN: [id number]; DOB: 1961/01/19  Admit date: 08/05/2020 Date of Consult: 08/06/2020  PCP:  [name] Jacobs, MD   [place]  Cardiologist:  [name] Rogue, MD  Advanced Practice Provider:  No care team member to display Electrophysiologist:  None   Patient Profile:   [name] [name] is a 60 y.o. 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 fib on Eliquis, OSA noncompliant with CPAP, tobacco use, history of leaving AMA and noncompliance who is being seen today for the evaluation of non-STEMI at the request of Dr [name] [name].  History of Present Illness:   [name] [name] followed by [name] [name] for the above cardiac issues. Patient has a history of multivessel CAD.  He had LAD and RCA interventions in 2013 in 2014.  In November 2018 he was found to have new cardiomyopathy with EF 25 to 30%.  Cath showed severe multivessel CAD with CT referral.  He sought a second opinion at [facility] and was followed by them briefly.  Cardiac MRI in 08/2017 showed EF 19% with multiple areas of ischemia with recommendations to optimize medical therapy prior to CABG.  He subsequently denied CABG.  Echoes over the years have showed persistent cardiomyopathy.  She was hospitalized in August 2021 for A. fib found to have elevated troponins. Echo showed EF less than 20%.  He was also hypotensive. Transferred to [facility] was considered but patient declined.  Left heart cath [facility] showed three-vessel disease, patent proximal LAD stent, moderate  dLAD disease, significant stenosis proximal left circumflex, bifurcation lesions, patent stent in the RCA, significant proximal disease in the mid to distal segment. It was felt cardiomyopathy was out of proportion to the CAD. Atherectomy to the left circumflex was considered but patient was not optimized from a heart failure  standpoint.  Ultimately no PCI was performed and patient left AMA.  Patient was admitted to [facility] 01/29/2020 for CHF and left AMA on 8/25.  Patient was seen in follow-up since then.  He did start taking Eliquis and metoprolol.  Amiodarone was also started. He also has frequent day trips to same-day surgery for IV Lasix. Still takes torsemide at home.  Patient was admitted 06/17/2020 - 1/22 for biventricular heart failure, pulmonary hypertension, AKI and hypotension.  Echo showed LVEF less than 20% (14%), global hypokinesis, severely reduced RV function, severely dilated left and right atrium.  Right atrial pressure of 15 mmHg.  Patient was diuresed nearly 20 kg and transitioned to torsemide 40 mg [name].  He was seen in follow-up 07/07/2020 was overall doing well.  Reds vest was 29% at 183lbs.  No ACE/ARB/stress dose due to hypotension and kidney dysfunction.  Patient presented to the ER 08/05/2020 for left foot laceration redness. Reported drainage and foul odor. He was concerned for infection. In the ER vitals were stable. Labs showed hemoglobin 12.4, lactic acid normal x2, BNP 1000.  Chest x-ray with mild CHF but improved.  Left foot x-ray showed no abnormality.  Patient was started on IV antibiotics and admitted for cellulitis. On 08/06/2020 the patient reported chest pain with some shortness of breath. Troponin came back elevated.  EKG showed permanent A. Fib, chronic right bundle some ST depression in aVL.  On interview the patient reported worsening of breath for the last months. Also reports abdominal fullness and orthopnea. Weight today 189lbs.  Past Medical History:  Diagnosis Date  . Acute on chronic heart failure ([place])   . AKI (acute kidney injury) ([place]) 01/03/2018  . Angina at rest [facility]) 08/13/2017  . Arthritis   . Asthma   . Atherosclerosis   . BPH (benign prostatic hyperplasia)   . Carotid arterial disease ([place])   . Cellulitis of foot, left 09/27/2019  . Charcot's joint of foot, right    . Chronic combined systolic (congestive) and diastolic (congestive) heart failure ([place])    a. 02/2013 EF 50% by LV gram; b. 04/2017 Echo: EF 25-30%. diff HK. Gr2 DD. Mod dil LA/RV. PASP 35mHg.  .Marland KitchenChronic foot pain (Secondary Area of Pain) (Right) 01/29/2018  . COPD (chronic obstructive pulmonary disease) ([place]   . COPD with acute exacerbation ([place] 01/30/2020  . Coronary artery disease    a. 2013 S/P PCI of LAD ([facility];  b. 03/2013 PCI: RCA 90p ( 2.5 x 23 mm DES); c. 02/2014 Cath: patent RCA stent->Med Rx; d. 04/2017 Cath: LM nl, LAd 30ost/p, 62m, D1 80ost, LCX 90p/m, OM1 85, RCA 70/20p, 8067m0d->Referred for CT Surg-felt to be poor candidate.  . Coronary artery disease of native artery of native heart with stable angina pectoris (HCCClark Mills/08/2017   [facility]. Nuclear stress test showed inferior wall defect. Cardiac catheterization showed patent LAD stent with 90% stenosis in the proximal RCA. Ejection fraction was 50%. Status post PCI of the RCA with a 2.5 x 23 mm drug-eluting stent  . Diabetic neuropathy (HCCLevel [place] . Diabetic ulcer of right foot (HCCWooster . Hernia   . HFrEF (heart failure with reduced ejection fraction) (HCCMerton/22/2021  . Hyperlipidemia   . Hypertension   . Ischemic cardiomyopathy    a. 04/2017 Echo: EF 25-30%; b. 08/2017 Cardiac MRI ([facility]): EF 19%, sev glob HK. RVEF 20%, mod BAE, triv MR, mild-mod TR. Basal lateral subendocardial infarct (viable), inf/infsept, dist septal ischemia, basal to mid lat peri-infarct ischemia.  . Morbid obesity (HCCScottsville . Neuropathy   . PAD (peripheral artery disease) (HCCPenn Wynne  a. Followed by Dr. DewLucky [name]. 08/2010 Periph Angio: RSFA 70-80p (6X60 self-expanding stent); c. 09/2010 Periph Angio: L SFA 100p (7x unknown length self-expanding stent); d. 06/2015 Periph Angio: R SFA short segment occlusion (6x12 self-expanding stent).  . Restless leg syndrome   . Sepsis (HCCGlenville0/21/2019   . Sleep apnea   . Subclavian artery stenosis, right (HCCSandy [place] . Syncope and collapse   . Tobacco use    a. 75+ yr hx - still smoking 1ppd, down from 2 ppd.  . Type II diabetes mellitus (HCCSanta Nella . Unstable angina (HCCBer[place]/06/2016  . Varicose vein     Past Surgical History:  Procedure Laterality Date  . ABDOMINAL AORTAGRAM N/A 06/23/2013   Procedure: ABDOMINAL AORMaxcine HamSurgeon: MuhWellington HampshireD;  [facility];  Service: Cardiovascular;  Laterality: N/A;  . AMPUTATION Right 04/01/2018   Procedure: AMPUTATION BELOW KNEE;  Surgeon: DewAlgernon HuxleyD;  Location: [facility] ORS;  Service: Vascular;  Laterality: Right;  . APPENDECTOMY    . CARDIAC CATHETERIZATION  10/14   [facility]; X1 STENT PROXIMAL RCA  . CARDIAC CATHETERIZATION  02/2010   [facility]  . CARDIAC CATHETERIZATION  02/21/2014   [facility]  . CLAVICLE SURGERY    . HERNIA REPAIR    . IRRIGATION AND DEBRIDEMENT FOOT Right  01/04/2018   Procedure: IRRIGATION AND DEBRIDEMENT FOOT;  Surgeon: Samara Deist, DPM;  Location: [facility] ORS;  Service: Podiatry;  Laterality: Right;  . LEFT HEART CATH AND CORONARY ANGIOGRAPHY N/A 01/20/2020   Procedure: LEFT HEART CATH AND CORONARY ANGIOGRAPHY;  Surgeon: Wellington Hampshire, MD;  Location: [place] CV LAB;  Service: Cardiovascular;  Laterality: N/A;  . LEFT HEART CATH AND CORS/GRAFTS ANGIOGRAPHY N/A 04/28/2017   Procedure: LEFT HEART CATH AND CORONARY ANGIOGRAPHY;  Surgeon: Wellington Hampshire, MD;  Location: [place] CV LAB;  Service: Cardiovascular;  Laterality: N/A;  . LOWER EXTREMITY ANGIOGRAPHY Right 03/03/2018   Procedure: LOWER EXTREMITY ANGIOGRAPHY;  Surgeon: Katha Cabal, MD;  Location: [place] CV LAB;  Service: Cardiovascular;  Laterality: Right;  . LOWER EXTREMITY ANGIOGRAPHY Left 08/24/2018   Procedure: LOWER EXTREMITY ANGIOGRAPHY;  Surgeon: Algernon Huxley, MD;  Location: [place] CV LAB;  Service: Cardiovascular;  Laterality: Left;  . LOWER EXTREMITY ANGIOGRAPHY Left 06/14/2019    Procedure: LOWER EXTREMITY ANGIOGRAPHY;  Surgeon: Algernon Huxley, MD;  Location: [place] [facility] LAB;  Service: Cardiovascular;  Laterality: Left;  . LOWER EXTREMITY ANGIOGRAPHY Left 08/02/2019   Procedure: LOWER EXTREMITY ANGIOGRAPHY;  Surgeon: Algernon Huxley, MD;  Location: [place] CV LAB;  Service: Cardiovascular;  Laterality: Left;  . LOWER EXTREMITY ANGIOGRAPHY Left 07/17/2020   Procedure: LOWER EXTREMITY ANGIOGRAPHY;  Surgeon: Algernon Huxley, MD;  Location: [place] CV LAB;  Service: Cardiovascular;  Laterality: Left;  . PERIPHERAL ARTERIAL STENT GRAFT     x2 left/right  . PERIPHERAL VASCULAR BALLOON ANGIOPLASTY Right 01/06/2018   Procedure: PERIPHERAL VASCULAR BALLOON ANGIOPLASTY;  Surgeon: Katha Cabal, MD;  Location: [place] CV LAB;  Service: Cardiovascular;  Laterality: Right;  . PERIPHERAL VASCULAR CATHETERIZATION N/A 07/06/2015   Procedure: Abdominal Aortogram w/Lower Extremity;  Surgeon: Algernon Huxley, MD;  Location: [place] CV LAB;  Service: Cardiovascular;  Laterality: N/A;  . PERIPHERAL VASCULAR CATHETERIZATION  07/06/2015   Procedure: Lower Extremity Intervention;  Surgeon: Algernon Huxley, MD;  Location: [place] CV LAB;  Service: Cardiovascular;;     Home Medications:  Prior to Admission medications   Medication Sig Start Date End Date Taking? Authorizing Provider  albuterol (VENTOLIN HFA) 108 (90 Base) MCG/ACT inhaler Inhale into the lungs every 6 (six) hours as needed for wheezing or shortness of breath.   Yes [provider]  atorvastatin (LIPITOR) 80 MG tablet Take 80 mg by mouth [name].   Yes [provider]  carvedilol (COREG) 3.125 MG tablet Take 1 tablet (3.125 mg total) by mouth 2 (two) times [name] with a meal. 07/01/20  Yes Shawna Clamp, MD  clopidogrel (PLAVIX) 75 MG tablet Take 75 mg by mouth [name].  08/22/17  Yes [provider]  digoxin (LANOXIN) 0.125 MG tablet Take 1 tablet (0.125 mg total) by mouth [name]. 07/02/20   Yes Shawna Clamp, MD  DULoxetine (CYMBALTA) 30 MG capsule Take 30 mg by mouth [name].    Yes [provider]  ELIQUIS 5 MG TABS tablet Take 5 mg by mouth 2 (two) times [name]. 02/02/20  Yes [provider]  empagliflozin (JARDIANCE) 10 MG TABS tablet Take 10 mg by mouth [name].  02/03/20  Yes [provider]  ezetimibe (ZETIA) 10 MG tablet Take 10 mg by mouth [name].   Yes [provider]  gabapentin (NEURONTIN) 800 MG tablet Take 800 mg by mouth 4 (four) times [name].  08/14/17 04/04/29 Yes [provider]  ipratropium-albuterol (DUONEB)  0.5-2.5 (3) MG/3ML SOLN Inhale 3 mLs into the lungs every 6 (six) hours as needed (wheezing/sob).    Yes [provider]  isosorbide mononitrate (IMDUR) 30 MG 24 hr tablet Take 30 mg by mouth [name].   Yes [provider]  levofloxacin (LEVAQUIN) 750 MG tablet Take 1 tablet (750 mg total) by mouth [name] for 10 days. 08/03/20 08/13/20 Yes Kris Hartmann, NP  Multiple Vitamin (MULTI-VITAMINS) TABS Take 1 tablet by mouth [name].    Yes [provider]  nitroGLYCERIN (NITROSTAT) 0.4 MG SL tablet Place 1 tablet (0.4 mg total) under the tongue every 5 (five) minutes as needed for chest pain. 05/19/19  Yes Hackney, Otila Kluver A, FNP  omeprazole (PRILOSEC) 40 MG capsule Take 40 mg by mouth [name].   Yes [provider]  Oxycodone HCl 10 MG TABS Take 10 mg by mouth every 6 (six) hours as needed (pain).  01/15/18  Yes [provider]  potassium chloride (KLOR-CON) 10 MEQ tablet Take 10 mEq by mouth 2 (two) times [name].   Yes [provider]  sodium chloride 1 g tablet Take 1 tablet (1 g total) by mouth 3 (three) times [name] with meals. 06/12/20  Yes Darylene Price A, FNP  spironolactone (ALDACTONE) 25 MG tablet Take 0.5 tablets (12.5 mg total) by mouth [name]. 07/02/20  Yes Shawna Clamp, MD  SYMBICORT 80-4.5 MCG/ACT inhaler Inhale 2 puffs into the lungs [name].  01/01/20  Yes [provider]   tamsulosin (FLOMAX) 0.4 MG CAPS capsule Take 1 capsule (0.4 mg total) by mouth [name] after supper. 07/01/20  Yes Shawna Clamp, MD  torsemide (DEMADEX) 20 MG tablet Take 2 tablets (40 mg total) by mouth [name]. 07/01/20  Yes Shawna Clamp, MD  traZODone (DESYREL) 150 MG tablet Take 150 mg by mouth at bedtime. 07/12/19  Yes [provider]  Vericiguat 5 MG TABS Take 5 mg by mouth [name].   Yes [provider]  Ipratropium-Albuterol (COMBIVENT RESPIMAT) 20-100 MCG/ACT AERS respimat Inhale 1 puff into the lungs every 6 (six) hours as needed for wheezing. Patient not taking: Reported on 08/05/2020    [provider]    Inpatient Medications: Scheduled Meds: . apixaban  5 mg Oral BID  . atorvastatin  80 mg Oral [name]  . carvedilol  3.125 mg Oral BID WC  . clopidogrel  75 mg Oral [name]  . digoxin  0.125 mg Oral [name]  . DULoxetine  30 mg Oral [name]  . ezetimibe  10 mg Oral [name]  . gabapentin  800 mg Oral QID  . insulin aspart  0-15 Units Subcutaneous TID WC  . isosorbide mononitrate  30 mg Oral [name]  . mometasone-formoterol  2 puff Inhalation BID  . pantoprazole  40 mg Oral [name]  . potassium chloride  10 mEq Oral BID  . sodium chloride flush  3 mL Intravenous Q12H  . spironolactone  12.5 mg Oral [name]  . tamsulosin  0.4 mg Oral QPC supper  . torsemide  40 mg Oral [name]  . traZODone  150 mg Oral QHS  . Vericiguat  5 mg Oral [name]   Continuous Infusions: . piperacillin-tazobactam (ZOSYN)  IV    . vancomycin     PRN Meds: acetaminophen **OR** [DISCONTINUED] acetaminophen, albuterol, nitroGLYCERIN, oxyCODONE, polyethylene glycol  Allergies:    Allergies  Allergen Reactions  . Dulaglutide Anaphylaxis, Diarrhea and Hives  . Other Itching    Skin itching associated with nitro patch  . Prednisone Rash    Social  History:   Social History   Socioeconomic History  . Marital status: Single    Spouse name: Not on file  . Number of children: 0  . Years of  education: 74  . Highest education level: 12th grade  Occupational History  . Occupation: on disability  Tobacco Use  . Smoking status: Current Every Day Smoker    Packs/day: 1.00    Years: 41.00    Pack years: 41.00    Types: Cigarettes  . Smokeless tobacco: Never Used  Vaping Use  . Vaping Use: Never used  Substance and Sexual Activity  . Alcohol use: No  . Drug use: No  . Sexual activity: Yes  Other Topics Concern  . Not on file  Social History Narrative   Lives at home in [place] with girlfriend. Independent at baseline.   Social Determinants of Health   Financial Resource Strain: Not on file  Food Insecurity: Not on file  Transportation Needs: Not on file  Physical Activity: Not on file  Stress: Not on file  Social Connections: Not on file  Intimate Partner Violence: Not on file    Family History:   Family History  Problem Relation Age of Onset  . Heart attack Father   . Heart disease Father   . Hypertension Father   . Hyperlipidemia Father   . Hypertension Mother   . Hyperlipidemia Mother      ROS:  Please see the history of present illness.  All other ROS reviewed and negative.     Physical Exam/Data:   Vitals:   08/06/20 0500 08/06/20 0639 08/06/20 0641 08/06/20 1018  BP: (!) 104/59 (!) 88/62 (!) 84/66 127/73  Pulse: (!) 59  70 85  Resp: 11  18   Temp:   98.3 F (36.8 C) 98.4 F (36.9 C)  TempSrc:   Oral Oral  SpO2: 97%  96% 95%  Weight:   86.1 kg   Height:   '5\' 7"'$  (1.702 m)     Intake/Output Summary (Last 24 hours) at 08/06/2020 1142 Last data filed at 08/06/2020 0641 Gross per 24 hour  Intake 400.14 ml  Output 600 ml  Net -199.86 ml   Last 3 Weights 08/06/2020 08/05/2020 07/20/2020  Weight (lbs) 189 lb 13.1 oz 183 lb 185 lb  Weight (kg) 86.1 kg 83.008 kg 83.915 kg  Some encounter information is confidential and restricted. Go to Review Flowsheets activity to see all data.     Body mass index is 29.73 kg/m.  General:  Well nourished,  well developed, in no acute distress HEENT: normal Lymph: no adenopathy Neck: + JVD Endocrine:  No thryomegaly Vascular: No carotid bruits; FA pulses 2+ bilaterally without bruits  Cardiac:  normal S1, S2; RRR; no murmur  Lungs:  clear to auscultation bilaterally, no wheezing, rhonchi or rales  Abd: soft, nontender, no hepatomegaly  Ext: no edema Musculoskeletal:  No deformities, BUE and BLE strength normal and equal Skin: warm and dry  Neuro:  CNs 2-12 intact, no focal abnormalities noted Psych:  Normal affect   EKG:  The EKG was personally reviewed and demonstrates:  Afib, 69bpm, minimal ST depression aVL, RBBB Telemetry:  Telemetry was personally reviewed and demonstrates:  Afib, HR 60-70s, 4 beats NSVT  Relevant CV Studies:  Echo 06/17/2020 1. Left ventricular ejection fraction, by estimation, is <20%. Left  ventricular ejection fraction by PLAX is 14 %. The left ventricle has  severely decreased function. The left ventricle demonstrates global  hypokinesis. The left  ventricular internal  cavity size was severely dilated. Left ventricular diastolic parameters  are indeterminate.  2. Right ventricular systolic function is severely reduced. The right  ventricular size is moderately enlarged. There is severely elevated  pulmonary artery systolic pressure. The estimated right ventricular  systolic pressure is [id number] mmHg.  3. Left atrial size was severely dilated.  4. Right atrial size was severely dilated.  5. Tricuspid valve regurgitation is mild to moderate.  6. The inferior vena cava is dilated in size with <50% respiratory  variability, suggesting right atrial pressure of 15 mmHg.   Cardiac catheterization 01/12/2020  Conclusion    Prox RCA-1 lesion is 80% stenosed.  Prox RCA-2 lesion is 20% stenosed.  Prox RCA to Mid RCA lesion is 80% stenosed.  Prox LAD lesion is 20% stenosed.  1st Diag lesion is 80% stenosed.  Mid LAD lesion is 20% stenosed.  Dist LAD  lesion is 60% stenosed.  Prox Cx to Mid Cx lesion is 90% stenosed.  LPAV lesion is 95% stenosed.   1. Significant underlying three-vessel coronary artery disease. Patent proximal LAD stent with mild in-stent restenosis. Moderate diffuse disease in the distal LAD. Significant stenosis in the proximal left circumflex at the origin of the posterior AV groove artery which is also heavily diseased at the ostium. This is a bifurcation lesion and heavily calcified. RCA stent is patent. However, there is significant proximal disease in the whole mid to distal segment is diffusely diseased and small caliber. 2. Left ventricular angiography was not performed. EF was severe reduced by echo. 3. Moderately elevated left ventricular end-diastolic pressure at 28 mmHg  Recommendations: The patient's cardiomyopathy seems to be out of proportion to his coronary artery disease as the LAD itself does not seem to have obstructive disease at the present time. The RCA is diffusely diseased and too small to stent. The only target for revascularization would be the left circumflex but it is a calcified bifurcation lesion and likely requires atherectomy. In order to do this safely, the patient has to be optimized from a heart failure standpoint as he appears to be volume overloaded. Recommend intravenous diuresis. If limited by hypotension, we might need to consider inotropic support and small dose norepinephrine drip initially. I added small dose digoxin. Treat underlying atrial fibrillation. Resume heparin drip 8 hours after sheath pull.   Coronary Diagrams   Diagnostic Dominance: Co-dominant    Intervention    Implants    No implant documentation for this case.    Syngo Images  Show images for CARDIAC CATHETERIZATION  Images on Long Term Storage  Show images for Lavi, Nitka to Procedure Log  Procedure Log     Hemo Data (last 2 days) before discharge  AO Systolic Cath Pressure  AO Diastolic Cath Pressure AO Mean Cath Pressure LV Systolic Cath Pressure LV End Diastolic LV Systolic LV End Diastolic LV dP/dt  -- -- -- -- -- 99 mmHg 29 mmHg 576 mmHg/sec  -- -- -- 100 mmHg 27 mmHg -- -- --  -- -- -- 99 mmHg 28 mmHg -- -- --  -- -- -- 99 mmHg 29 mmHg -- -- --  94 61 mmHg 76 mmHg -- -- -- -- --  97 64 mmHg 77 mmHg -- -- -- -- --  100 66 mmHg 77 mmHg -- -- -- -- --   Encounter-Level Documents on 01/19/2020:  Document on 01/23/2020 6:05 PM by Diona Foley: ED PB Billing Extract Scan on 01/21/2020 1:12  PM by Default, Provider, MD Scan on 01/20/2020 1:56 PM by Default, Provider, MD Scan on 01/20/2020 9:16 AM by Default, Provider, MD Scan on 01/20/2020 2:47 AM by Default, Provider, MD Scan on 01/19/2020 12:32 PM by Default, Provider, MD Electronic signature on 01/19/2020 11:13 AM - 1 of 3 e-signatures recorded      Order-Level Documents on 01/19/2020:  Scan on 01/21/2020 12:16 PM by Default, Provider, MD Scan on 01/20/2020 6:21 PM by Default, Provider, MD      Signed  Electronically signed by Wellington Hampshire, MD on 01/20/20 at 1419 EDT   External Result Report  External Result Report      Laboratory Data:  High Sensitivity Troponin:   Recent Labs  Lab 08/06/20 0043 08/06/20 0205 08/06/20 0415 08/06/20 0715  TROPONINIHS 652* 863* 1,190* 1,186*     Chemistry Recent Labs  Lab 08/05/20 1841 08/06/20 0403  NA 135 135  K 4.0 4.2  CL 92* 96*  CO2 31 32  GLUCOSE 185* 97  BUN 10 10  CREATININE 1.19 1.09  CALCIUM 9.1 8.8*  GFRNONAA >60 >60  ANIONGAP 12 7    Recent Labs  Lab 08/05/20 1841 08/06/20 0403 08/06/20 0715  PROT 6.7 6.2*  --   ALBUMIN 3.6 3.1*  --   AST 17 33  --   ALT 9 9  --   ALKPHOS 92 78  --   BILITOT 1.6* 1.6* 1.6*   Hematology Recent Labs  Lab 08/05/20 1841 08/06/20 0403  WBC 7.5 6.4  RBC 4.48 4.06*  HGB 12.4* 11.4*  HCT 38.7* 35.1*  MCV 86.4 86.5  MCH 27.7 28.1  MCHC 32.0 32.5  RDW 21.2* 21.0*  PLT 143* 132*    BNP Recent Labs  Lab 08/05/20 1844  BNP 1,069.1*    DDimer No results for input(s): DDIMER in the last 168 hours.   Radiology/Studies:  DG Chest Portable 1 View  Result Date: 08/05/2020 CLINICAL DATA:  Shortness of breath. Swelling of the lower extremities. EXAM: PORTABLE CHEST 1 VIEW COMPARISON:  06/25/2020 FINDINGS: Cardiomegaly. Aortic atherosclerosis. Pulmonary venous hypertension with mild interstitial edema. No visible effusion. Findings consistent with congestive heart failure. Improved appearance compared to the study of 10 days ago. IMPRESSION: Congestive heart failure. Improved since the study of 10 days ago. Electronically Signed   By: Nelson Chimes M.D.   On: 08/05/2020 22:58   DG Foot Complete Left  Result Date: 08/05/2020 CLINICAL DATA:  Shortness of breath and foot swelling left foot laceration EXAM: LEFT FOOT - COMPLETE 3+ VIEW COMPARISON:  None. FINDINGS: There is no evidence of fracture or dislocation. There is no evidence of arthropathy or other focal bone abnormality. Dorsal soft tissue swelling is seen. Calcaneal enthesophytes are noted. IMPRESSION: No acute osseous abnormality. Electronically Signed   By: Prudencio Pair M.D.   On: 08/05/2020 23:03     Assessment and Plan:   Cellulitis -Admitted with cellulitis of left foot and started on IV antibiotics -Foot x-ray unremarkable -Patient has a history of MRSA infection in the right lower extremity resulting in right BKA - per IM  NSTEMI Known severe CAD - Admitted for cellulitis and he had episode of chest pain and troponin came back elevated, peaked at 1,190 and now down trending. EGK with minimal ST depression aVL. Start IV heparin. -Patient previously refused [facility]G at [facility]. Last cath in August 2021 for non-STEMI showed severe multivessel CAD.  Atherectomy to the left circumflex was considered but patient left AMA - PTA  not on Aspirin with Eliquis. Stop Eliquis and start IV heparin.  - start Aspirin for 1 week  per MD instructions -continue plavix.  -Continue Lipitor/Zetia -Continue beta-blocker Imdur - Cath was discussed but patient elected for medical management. Plan for IV heparin x 48 hours.   HFrEF, EF<20% -BNP 1000. CXR showed improved CHF from study 10 days ago -Reports stable edema .  - Weight today 189lbs, up about 6 lbs from 1/28 follow-up - Home torsemide continued -Continue Coreg, Imdur, Spyro, digoxin, verquvo - Previously not started on ACE/ARB/Entresto due to hypotension and kidney dysfunction - dig level 0.9 - Mild volume overload.Might benefit from some IV laisx. Will order IV lasix '40mg'$  BID.  - strict I/Os, [name] weights, and monitor creatinine with diuresis  HLD - continue atorvastatin  HTN - continue home meds - pressures soft  COPD - mild wheezing - per IM  Permanent A. Fib -Continue Coreg and digoxin -CHA2DS2-VASc at least 4 (CHF, hypertension, diabetes, PAD).  - Hold Eliquis for IV heparin  DM2 - per IM  Tobacco use - cessation encouraged   For questions or updates, please contact [place] Please consult [email or web address] for contact info under    Signed, Cadence Ninfa Meeker, PA-C  08/06/2020 11:42 AM   Patient admitted having presented with worsening left foot erythema cracking and malodor.  Admission laboratories were notable for an elevated BNP and elevated troponin with a delta 652--1190 consistent with a non-STEMI.  The patient reports having had chest discomfort with shortness of breath 24 hours or so prior to admission.  He also reports although we do not have confirmation that he was prescribed Salt Tablets through hte heart failure clinic   He is about a year and a half status post BKA for nonhealing diabetic ulcer which prompted his concern  Non-STEMI  Congestive heart failure-acute/chronic class III  Three-vessel coronary disease  Ischemic cardiomyopathy  Atrial fibrillation-permanent  Status post BKA-diabetic  ulcer  Cellulitis-recurrent   Patient admitted with above.  Modestly dyspneic w/ recent prescribing of salt tablets aggravating his congestive heart failure.  Treated now with intravenous diuretics and would continue for at least 24 hours.  Renal function is normal so vigorous diuresis until his creatinine bumps I think is reasonable  Lengthy discussion regarding his approach to his coronary disease.  He was felt not to be a CABG candidate.  He left AMA last summer prior to atherectomy.  He is disinclined to undergo PCI for his coronary disease.  Hence, we will not undertake catheterization and will treat him medically.  For now we will hold his Eliquis and put him on heparin, continue his clopidogrel and add aspirin.  We will plan to stop the latter in about a week and resume Eliquis at discharge

## 2020-08-06 NOTE — ED Notes (Signed)
Provider notified of critical troponin result of 652. No new orders placed at this time.

## 2020-08-06 NOTE — ED Notes (Signed)
Provided notified of troponin increasing from 652 to 863. No new orders given at this time

## 2020-08-06 NOTE — Progress Notes (Signed)
Date and time results received: 08/06/20     Test: Troponin   Critical Value: 1190  Name of Provider Notified: Pablo Ledger. NP  Orders Received? Or Actions Taken?: Awaiting response

## 2020-08-06 NOTE — Progress Notes (Signed)
Per provider Pablo Ledger. NP: "elevated trop from fluid overload. he will need antibiotics for his cellulitis, likely surgical debridement of wound and oral/IV diuretic. he should be fine there. if he should change in some way then he can be moved to higher level of care. and FYI no 2A beds and there is no reason for this patient to wait in ER".  This RN informed the ED nurse to bring the patient to [facility] per providers orders. Awaiting patient's arrival.

## 2020-08-06 NOTE — Progress Notes (Signed)
  Pharmacy Antibiotic Note  [name] [name] is a 60 y.o. male admitted on 08/05/2020 with cellulitis.  Pharmacy has been consulted for Zosyn and Vancomycin dosing.  Pt received LD of Vancomycin 2gm in ER  Plan: Zosyn 3.375 gm EI q8hr   Pt received vancomycin 2000 mg loading dose and started on vancomycin 1750 mg q24H maintenance dosing. Will adjust to 1000 mg q12H due to improvement in Scr.    Height: '5\' 7"'$  (170.2 cm) Weight: 86.1 kg (189 lb 13.1 oz) IBW/kg (Calculated) : 66.1  Temp (24hrs), Avg:98.5 F (36.9 C), Min:98.3 F (36.8 C), Max:98.7 F (37.1 C)  Recent Labs  Lab 08/05/20 1841 08/05/20 2239 08/06/20 0403  WBC 7.5  --  6.4  CREATININE 1.19  --  1.09  LATICACIDVEN 1.3 1.9  --     Estimated Creatinine Clearance: 78.4 mL/min (by C-G formula based on SCr of 1.09 mg/dL).    Allergies  Allergen Reactions  . Dulaglutide Anaphylaxis, Diarrhea and Hives  . Other Itching    Skin itching associated with nitro patch  . Prednisone Rash    Antimicrobials this admission: 02/26 Zosyn >>  02/27 Vancomycin >>   Microbiology results: No lab cultures have been ordered at this time.  Thank you for allowing pharmacy to be a part of this patient's care.  Eleonore Chiquito, PharmD  08/06/2020 8:48 AM

## 2020-08-06 NOTE — Progress Notes (Signed)
Reviewed patient's chart for admission to General Surgery unit. Reached out to the charge nurse, nursing supervisor, and house coverage provider to inquire about the possibility of the patient to be admitted to the cardiac unit as it appears there are many cardiac concerns (including current c/o chest pain) for this patient. Awaiting response.

## 2020-08-06 NOTE — Progress Notes (Signed)
[name] [name]  [id number] DOB: August 20, 1960 DOA: 08/05/2020 PCP: [name], MD    Brief Narrative:  60 year old with a history of CHF, CAD, HLD, PAD, DM 2, COPD, right BKA, cervical spinal stenosis, chronic lymphedema, venous insufficiency, HTN, atrial fibrillation, and obesity who presented to the ER with a left foot laceration.  Patient reported dryness and cracking of his left foot which had worsened over the past couple of weeks.  He began to notice a foul odor coming from the foot the day of his admission when he found significant drainage between his first and second toes.  Significant Events:  2/26 admit via ER  Antimicrobials:  Vancomycin 2/26 > Zosyn 2/26 >  DVT prophylaxis: Eliquis > IV heparin   Subjective: Denies ever having had CP at time of my visit. Reports ongoing pain in his L foot, as well as nausea and poor appetite. Is somnolent. Reports he got very little sleep last night.   Assessment & Plan:  Diabetic foot infection No evidence on plain film x-ray to suggest osteomyelitis - ABIs 07/17/20 0.73 - had L LE angiogram 07/17/20 demonstrated moderate disease of the common femoral artery and the first 4 to 5 cm of the superficial femoral artery in the 60 to 70% range. This tracked down to just above the previously placed stent. All of the stents in the SFA and popliteal artery are widely patent with less than 10% stenosis. There was then two-vessel runoff distally with both posterior tibial and peroneal arteries which were patent without significant stenosis - cont broad spectrum abx coverage - may need to consider MRI of foot   NSTEMI / Elevated troponin - Complex hx of CAD Appears to be NSTEMI in setting of acute distress of cellulitis - has had some intermittent cp on hx - will ask his Cardiologist (Dr. Rockey Situ, [facility]) to evaluate in consultation - hx of LAD and RCA interventions 2013 and 2014 - 2018 cath noted severe multivessel CAD w/ surgical referral but pt refused  CABG - last cath Aug 2021 noted patent proximal LAD stent, moderate distal LAD disease, significant stenosis in the proximal left circumflex, bifurcation lesions, patent stent to the RCA, significant proximal disease in the mid to distal segment  Hypotension  Resolved   Chronic Severe Ischemic Systolic CHF and Severe R Heart Failure  EF < 20% per TTE Jan 2022 - care per Cards - diuresis underway   DM 2 CBG currently controlled   PAD s/p L SFA stenting and R BKA  HLD Cont medical tx  COPD Ongoing tobacco abuse   Chronic atrial fibrillation resume usual Eliquis dosing once heparin gtt discontinued   Chronic neuropathic pain  Hx of noncompliance Often leaves AMA   Code Status: FULL CODE Family Communication:  Status is: Inpatient  Remains inpatient appropriate because:Inpatient level of care appropriate due to severity of illness   Dispo: The patient is from: Home              Anticipated d/c is to: Home              Patient currently is not medically stable to d/c.   Difficult to place patient No   Consultants:  Cardiology   Objective: Blood pressure (!) 84/66, pulse 70, temperature 98.3 F (36.8 C), temperature source Oral, resp. rate 18, height '5\' 7"'$  (1.702 m), weight 86.1 kg, SpO2 96 %.  Intake/Output Summary (Last 24 hours) at 08/06/2020 1001 Last data filed at 08/06/2020 0641 Gross per  24 hour  Intake 400.14 ml  Output 600 ml  Net -199.86 ml   Filed Weights   08/05/20 1840 08/06/20 0641  Weight: 83 kg 86.1 kg    Examination: General: No acute respiratory distress Lungs: Clear to auscultation bilaterally without wheezes or crackles Cardiovascular: Regular rate and rhythm without murmur gallop or rub normal S1 and S2 Abdomen: Nontender, overweight, nondistended, soft, bowel sounds positive, no rebound, no ascites, no appreciable mass Extremities: trace L LE edema - wound dry at this time - L LE warm to touch and red to knee circumferentially    CBC: Recent Labs  Lab 08/05/20 1841 08/06/20 0403  WBC 7.5 6.4  NEUTROABS 6.1  --   HGB 12.4* 11.4*  HCT 38.7* 35.1*  MCV 86.4 86.5  PLT 143* [id number]*   Basic Metabolic Panel: Recent Labs  Lab 08/05/20 1841 08/06/20 0403  NA 135 135  K 4.0 4.2  CL 92* 96*  CO2 31 32  GLUCOSE 185* 97  BUN 10 10  CREATININE 1.19 1.09  CALCIUM 9.1 8.8*  MG 1.9  --    GFR: Estimated Creatinine Clearance: 78.4 mL/min (by C-G formula based on SCr of 1.09 mg/dL).  Liver Function Tests: Recent Labs  Lab 08/05/20 1841 08/06/20 0403 08/06/20 0715  AST 17 33  --   ALT 9 9  --   ALKPHOS 92 78  --   BILITOT 1.6* 1.6* 1.6*  PROT 6.7 6.2*  --   ALBUMIN 3.6 3.1*  --    No results for input(s): LIPASE, AMYLASE in the last 168 hours. No results for input(s): AMMONIA in the last 168 hours.  Coagulation Profile: No results for input(s): INR, PROTIME in the last 168 hours.  Cardiac Enzymes: No results for input(s): CKTOTAL, CKMB, CKMBINDEX, TROPONINI in the last 168 hours.  HbA1C: Hemoglobin A1C  Date/Time Value Ref Range Status  04/04/2013 04:16 AM 9.8 (H) 4.2 - 6.3 % Final    Comment:    The American Diabetes Association recommends that a primary goal of therapy should be <7% and that physicians should reevaluate the treatment regimen in patients with HbA1c values consistently >8%.    Hgb A1c MFr Bld  Date/Time Value Ref Range Status  08/06/2020 04:03 AM 6.5 (H) 4.8 - 5.6 % Final    Comment:    (NOTE) Pre diabetes:          5.7%-6.4%  Diabetes:              >6.4%  Glycemic control for   <7.0% adults with diabetes   06/17/2020 06:00 AM 6.1 (H) 4.8 - 5.6 % Final    Comment:    (NOTE) Pre diabetes:          5.7%-6.4%  Diabetes:              >6.4%  Glycemic control for   <7.0% adults with diabetes     CBG: Recent Labs  Lab 08/06/20 0701 08/06/20 0802  GLUCAP 87 134*     Scheduled Meds: . apixaban  5 mg Oral BID  . atorvastatin  80 mg Oral Daily  . carvedilol   3.125 mg Oral BID WC  . clopidogrel  75 mg Oral Daily  . digoxin  0.125 mg Oral Daily  . DULoxetine  30 mg Oral Daily  . ezetimibe  10 mg Oral Daily  . gabapentin  800 mg Oral QID  . insulin aspart  0-15 Units Subcutaneous TID WC  . isosorbide mononitrate  30 mg Oral Daily  . mometasone-formoterol  2 puff Inhalation BID  . pantoprazole  40 mg Oral Daily  . potassium chloride  10 mEq Oral BID  . sodium chloride flush  3 mL Intravenous Q12H  . spironolactone  12.5 mg Oral Daily  . tamsulosin  0.4 mg Oral QPC supper  . torsemide  40 mg Oral Daily  . traZODone  150 mg Oral QHS  . Vericiguat  5 mg Oral Daily   Continuous Infusions: . piperacillin-tazobactam (ZOSYN)  IV    . vancomycin       LOS: 0 days   Cherene Altes, MD [facility] Office  [phone] Pager - Text Page per Amion  If 7PM-7AM, please contact night-coverage per Amion 08/06/2020, 10:01 AM

## 2020-08-06 NOTE — Progress Notes (Signed)
  Pharmacy Antibiotic Note  OXFORD FOGLER is a 60 y.o. male admitted on 08/05/2020 with cellulitis.  Pharmacy has been consulted for Zosyn and Vancomycin dosing.  Pt received LD of Vancomycin 2gm in ER  Plan: Ordered Zosyn 3.375 gm EI q8hr - CrCl 71.3  Vancomycin 1750 mg IV Q 24 hrs.  Goal AUC 400-550. Expected AUC: 500.8 SCr used: 1.19   Height: 5' 7.5" (171.5 cm) Weight: 83 kg (183 lb) IBW/kg (Calculated) : 67.25  Temp (24hrs), Avg:98.7 F (37.1 C), Min:98.7 F (37.1 C), Max:98.7 F (37.1 C)  Recent Labs  Lab 08/05/20 1841 08/05/20 2239  WBC 7.5  --   CREATININE 1.19  --   LATICACIDVEN 1.3 1.9    Estimated Creatinine Clearance: 71.3 mL/min (by C-G formula based on SCr of 1.19 mg/dL).    Allergies  Allergen Reactions  . Dulaglutide Anaphylaxis, Diarrhea and Hives  . Other Itching    Skin itching associated with nitro patch  . Prednisone Rash    Antimicrobials this admission: 02/26 Zosyn >>  02/27 Vancomycin >>   Microbiology results: No lab cultures have been ordered at this time.  Thank you for allowing pharmacy to be a part of this patient's care.  Renda Rolls, PharmD, [facility] 08/06/2020 12:57 AM

## 2020-08-06 NOTE — Progress Notes (Signed)
Reports no current chest pain

## 2020-08-06 NOTE — Progress Notes (Signed)
ANTICOAGULATION CONSULT NOTE - Initial Consult  Pharmacy Consult for Heparin infusion  Indication: chest pain/ACS  Allergies  Allergen Reactions  . Dulaglutide Anaphylaxis, Diarrhea and Hives  . Ace Inhibitors     hypotension  . Other Itching    Skin itching associated with nitro patch  . Prednisone Rash    Patient Measurements: Height: '5\' 7"'$  (170.2 cm) Weight: 86.1 kg (189 lb 13.1 oz) IBW/kg (Calculated) : 66.1 Heparin Dosing Weight: 83.7  Vital Signs: Temp: 97.9 F (36.6 C) (02/27 2105) Temp Source: Oral (02/27 1507) BP: 112/79 (02/27 2105) Pulse Rate: 50 (02/27 2105)  Labs: Recent Labs    08/05/20 1841 08/06/20 0043 08/06/20 0205 08/06/20 0403 08/06/20 0415 08/06/20 0715 08/06/20 1433 08/06/20 2139  HGB 12.4*  --   --  11.4*  --   --   --   --   HCT 38.7*  --   --  35.1*  --   --   --   --   PLT 143*  --   --  132*  --   --   --   --   APTT  --   --   --   --   --   --  37* 60*  LABPROT  --   --   --   --   --   --  16.5*  --   INR  --   --   --   --   --   --  1.4*  --   HEPARINUNFRC  --   --   --   --   --   --  >2.20*  --   CREATININE 1.19  --   --  1.09  --   --   --   --   TROPONINIHS  --    < > 863*  --  1,190* 1,186*  --   --    < > = values in this interval not displayed.    Estimated Creatinine Clearance: 78.4 mL/min (by C-G formula based on SCr of 1.09 mg/dL).   Medical History: Past Medical History:  Diagnosis Date  . Acute on chronic heart failure ([place])   . AKI (acute kidney injury) ([place]) 01/03/2018  . Angina at rest [facility]) 08/13/2017  . Arthritis   . Asthma   . Atherosclerosis   . BPH (benign prostatic hyperplasia)   . Carotid arterial disease ([place])   . Cellulitis of foot, left 09/27/2019  . Charcot's joint of foot, right   . Chronic combined systolic (congestive) and diastolic (congestive) heart failure ([place])    a. 02/2013 EF 50% by LV gram; b. 04/2017 Echo: EF 25-30%. diff HK. Gr2 DD. Mod dil LA/RV. PASP 71mHg.  .Marland KitchenChronic foot pain  (Secondary Area of Pain) (Right) 01/29/2018  . COPD (chronic obstructive pulmonary disease) ([place]   . COPD with acute exacerbation ([place] 01/30/2020  . Coronary artery disease    a. 2013 S/P PCI of LAD ([facility];  b. 03/2013 PCI: RCA 90p ( 2.5 x 23 mm DES); c. 02/2014 Cath: patent RCA stent->Med Rx; d. 04/2017 Cath: LM nl, LAd 30ost/p, 625m, D1 80ost, LCX 90p/m, OM1 85, RCA 70/20p, 8061m0d->Referred for CT Surg-felt to be poor candidate.  . Coronary artery disease of native artery of native heart with stable angina pectoris (HCCGarden [place]/08/2017   [facility]. Nuclear stress test showed inferior wall defect.  Cardiac catheterization showed patent LAD stent with 90% stenosis in the proximal RCA. Ejection fraction was 50%. Status post PCI of the RCA with a 2.5 x 23 mm drug-eluting stent  . Diabetic neuropathy ([place])   . Diabetic ulcer of right foot ([place])   . Hernia   . HFrEF (heart failure with reduced ejection fraction) ([place]) 01/30/2020  . Hyperlipidemia   . Hypertension   . Ischemic cardiomyopathy    a. 04/2017 Echo: EF 25-30%; b. 08/2017 Cardiac MRI ([facility]): EF 19%, sev glob HK. RVEF 20%, mod BAE, triv MR, mild-mod TR. Basal lateral subendocardial infarct (viable), inf/infsept, dist septal ischemia, basal to mid lat peri-infarct ischemia.  . Morbid obesity ([place])   . Neuropathy   . PAD (peripheral artery disease) ([place])    a. Followed by Dr. Lucky Cowboy; b. 08/2010 Periph Angio: RSFA 70-80p (6X60 self-expanding stent); c. 09/2010 Periph Angio: L SFA 100p (7x unknown length self-expanding stent); d. 06/2015 Periph Angio: R SFA short segment occlusion (6x12 self-expanding stent).  . Restless leg syndrome   . Sepsis ([place]) 03/30/2018  . Sleep apnea   . Subclavian artery stenosis, right ([place])   . Syncope and collapse   . Tobacco use    a. 75+ yr hx - still smoking 1ppd, down from 2 ppd.  . Type II diabetes mellitus ([place])   . Unstable angina  ([place]) 07/11/2016  . Varicose vein      Assessment: 60 yo male admitted with cellulitis. PMH of DM, CHF, COPD, CAD, and A. Fib (on Eliquis). Troponin HS elevated at 1190 but no complaints of chest pain according to RN. Pharmacy has been consulted for heparin infusion dosing and monitoring. Last dose of apixaban 10/27 @ 1023.   Goal of Therapy:  Heparin level 0.3-0.7 units/ml aPTT 66-102 seconds Monitor platelets by anticoagulation protocol: Yes   0227 2139 aPTT 60, subtherapeutic   Plan:  Give 1200 units bolus x 1 Start heparin infusion at 1150 units/hr Will need to adjust dose based on aPTT levels until aPTT and HL  correlate.  Check aPTT level with AM labs and HL and CBC daily while on heparin Continue to monitor H&H and platelets  Renda Rolls, PharmD, [facility] 08/06/2020 11:26 PM

## 2020-08-06 NOTE — Progress Notes (Signed)
ANTICOAGULATION CONSULT NOTE - Initial Consult  Pharmacy Consult for Heparin infusion  Indication: chest pain/ACS  Allergies  Allergen Reactions  . Dulaglutide Anaphylaxis, Diarrhea and Hives  . Ace Inhibitors     hypotension  . Other Itching    Skin itching associated with nitro patch  . Prednisone Rash    Patient Measurements: Height: '5\' 7"'$  (170.2 cm) Weight: 86.1 kg (189 lb 13.1 oz) IBW/kg (Calculated) : 66.1 Heparin Dosing Weight: 83.7  Vital Signs: Temp: 98.4 F (36.9 C) (02/27 1018) Temp Source: Oral (02/27 1018) BP: 127/73 (02/27 1018) Pulse Rate: 85 (02/27 1018)  Labs: Recent Labs    08/05/20 1841 08/06/20 0043 08/06/20 0205 08/06/20 0403 08/06/20 0415 08/06/20 0715  HGB 12.4*  --   --  11.4*  --   --   HCT 38.7*  --   --  35.1*  --   --   PLT 143*  --   --  132*  --   --   CREATININE 1.19  --   --  1.09  --   --   TROPONINIHS  --    < > 863*  --  1,190* 1,186*   < > = values in this interval not displayed.    Estimated Creatinine Clearance: 78.4 mL/min (by C-G formula based on SCr of 1.09 mg/dL).   Medical History: Past Medical History:  Diagnosis Date  . Acute on chronic heart failure ([place])   . AKI (acute kidney injury) ([place]) 01/03/2018  . Angina at rest [facility]) 08/13/2017  . Arthritis   . Asthma   . Atherosclerosis   . BPH (benign prostatic hyperplasia)   . Carotid arterial disease ([place])   . Cellulitis of foot, left 09/27/2019  . Charcot's joint of foot, right   . Chronic combined systolic (congestive) and diastolic (congestive) heart failure ([place])    a. 02/2013 EF 50% by LV gram; b. 04/2017 Echo: EF 25-30%. diff HK. Gr2 DD. Mod dil LA/RV. PASP 65mHg.  .Marland KitchenChronic foot pain (Secondary Area of Pain) (Right) 01/29/2018  . COPD (chronic obstructive pulmonary disease) ([place]yp[place]   . COPD with acute exacerbation (HFarmington [place] 01/30/2020  . Coronary artery disease    a. 2013 S/P PCI of LAD ([facility];  b. 03/2013 PCI: RCA 90p ( 2.5 x 23 mm DES); c. 02/2014 Cath: patent RCA  stent->Med Rx; d. 04/2017 Cath: LM nl, LAd 30ost/p, 610m, D1 80ost, LCX 90p/m, OM1 85, RCA 70/20p, 8076m0d->Referred for CT Surg-felt to be poor candidate.  . Coronary artery disease of native artery of native heart with stable angina pectoris (HCCBr[place]/08/2017   [facility]. Nuclear stress test showed inferior wall defect. Cardiac catheterization showed patent LAD stent with 90% stenosis in the proximal RCA. Ejection fraction was 50%. Status post PCI of the RCA with a 2.5 x 23 mm drug-eluting stent  . Diabetic neuropathy (HCCWashtucna . Diabetic ulcer of right foot (HCCCentreville . Hernia   . HFrEF (heart failure with reduced ejection fraction) (HCCOak Grove Village/22/2021  . Hyperlipidemia   . Hypertension   . Ischemic cardiomyopathy    a. 04/2017 Echo: EF 25-30%; b. 08/2017 Cardiac MRI ([facility]): EF 19%, sev glob HK. RVEF 20%, mod BAE, triv MR, mild-mod TR. Basal lateral subendocardial infarct (viable), inf/infsept, dist septal ischemia, basal to mid lat peri-infarct ischemia.  . Morbid obesity (HCCGreensboro . Neuropathy   . PAD (peripheral  artery disease) ([place])    a. Followed by Dr. Lucky Cowboy; b. 08/2010 Periph Angio: RSFA 70-80p (6X60 self-expanding stent); c. 09/2010 Periph Angio: L SFA 100p (7x unknown length self-expanding stent); d. 06/2015 Periph Angio: R SFA short segment occlusion (6x12 self-expanding stent).  . Restless leg syndrome   . Sepsis ([place]) 03/30/2018  . Sleep apnea   . Subclavian artery stenosis, right ([place])   . Syncope and collapse   . Tobacco use    a. 75+ yr hx - still smoking 1ppd, down from 2 ppd.  . Type II diabetes mellitus ([place])   . Unstable angina ([place]) 07/11/2016  . Varicose vein      Assessment: 60 yo male admitted with cellulitis. PMH of DM, CHF, COPD, CAD, and A. Fib (on Eliquis). Troponin HS elevated at 1190 but no complaints of chest pain according to RN. Pharmacy has been consulted for heparin infusion dosing and  monitoring. Last dose of apixaban 10/27 @ 1023.   Goal of Therapy:  Heparin level 0.3-0.7 units/ml aPTT 66-102 seconds Monitor platelets by anticoagulation protocol: Yes   Plan:  Give 2000 units bolus x 1 Start heparin infusion at 1000 units/hr Will need to adjust dose based on aPTT levels until aPTT and HL  correlate.  Check aPTT level in 6 hours and HL and CBC daily while on heparin Continue to monitor H&H and platelets  Pernell Dupre, PharmD, BCPS Clinical Pharmacist 08/06/2020 2:21 PM

## 2020-08-06 NOTE — Progress Notes (Signed)
Patient has arrived to room 214 at this time. AA0x3, forgetful of the date. No distress noted. O2 via [place]. Patient oriented to staff and unit and equipment. Safety precautions in place. Call light within reach. Safety education provided. Plan of care reviewed with patient and he agree. Will continue to monitor and endorse.

## 2020-08-07 DIAGNOSIS — I248 Other forms of acute ischemic heart disease: Secondary | ICD-10-CM

## 2020-08-07 DIAGNOSIS — I4819 Other persistent atrial fibrillation: Secondary | ICD-10-CM | POA: Diagnosis not present

## 2020-08-07 LAB — COMPREHENSIVE METABOLIC PANEL
ALT: 8 U/L (ref 0–44)
AST: 18 U/L (ref 15–41)
Albumin: 3.2 g/dL — ABNORMAL LOW (ref 3.5–5.0)
Alkaline Phosphatase: 74 U/L (ref 38–126)
Anion gap: 10 (ref 5–15)
BUN: 12 mg/dL (ref 6–20)
CO2: 29 mmol/L (ref 22–32)
Calcium: 9 mg/dL (ref 8.9–10.3)
Chloride: 100 mmol/L (ref 98–111)
Creatinine, Ser: 1.26 mg/dL — ABNORMAL HIGH (ref 0.61–1.24)
GFR, Estimated: >60 mL/min (ref >60)
Glucose, Bld: 95 mg/dL (ref 70–99)
Potassium: 3.8 mmol/L (ref 3.5–5.1)
Sodium: 139 mmol/L (ref 135–145)
Total Bilirubin: 2 mg/dL — ABNORMAL HIGH (ref 0.3–1.2)
Total Protein: 6.1 g/dL — ABNORMAL LOW (ref 6.5–8.1)

## 2020-08-07 LAB — CBC
HCT: 37.2 % — ABNORMAL LOW (ref 39.0–52.0)
Hemoglobin: 12 g/dL — ABNORMAL LOW (ref 13.0–17.0)
MCH: 28 pg (ref 26.0–34.0)
MCHC: 32.3 g/dL (ref 30.0–36.0)
MCV: 86.7 fL (ref 80.0–100.0)
Platelets: 123 10*3/uL — ABNORMAL LOW (ref 150–400)
RBC: 4.29 MIL/uL (ref 4.22–5.81)
RDW: 21.1 % — ABNORMAL HIGH (ref 11.5–15.5)
WBC: 7.1 10*3/uL (ref 4.0–10.5)
nRBC: 0 % (ref 0.0–0.2)

## 2020-08-07 LAB — GLUCOSE, CAPILLARY
Glucose-Capillary: 106 mg/dL — ABNORMAL HIGH (ref 70–99)
Glucose-Capillary: 180 mg/dL — ABNORMAL HIGH (ref 70–99)
Glucose-Capillary: 144 mg/dL — ABNORMAL HIGH (ref 70–99)
Glucose-Capillary: 163 mg/dL — ABNORMAL HIGH (ref 70–99)

## 2020-08-07 LAB — BRAIN NATRIURETIC PEPTIDE: B Natriuretic Peptide: 1042.9 pg/mL — ABNORMAL HIGH (ref 0.0–100.0)

## 2020-08-07 LAB — APTT
aPTT: 66 seconds — ABNORMAL HIGH (ref 24–36)
aPTT: 51 seconds — ABNORMAL HIGH (ref 24–36)
aPTT: 78 seconds — ABNORMAL HIGH (ref 24–36)

## 2020-08-07 LAB — TROPONIN I (HIGH SENSITIVITY): Troponin I (High Sensitivity): 504 ng/L (ref <18)

## 2020-08-07 LAB — TSH: TSH: 1.573 u[IU]/mL (ref 0.350–4.500)

## 2020-08-07 LAB — MAGNESIUM: Magnesium: 2.1 mg/dL (ref 1.7–2.4)

## 2020-08-07 LAB — HEPARIN LEVEL (UNFRACTIONATED): Heparin Unfractionated: 1.47 IU/mL — ABNORMAL HIGH (ref 0.30–0.70)

## 2020-08-07 MED ORDER — VANCOMYCIN HCL 1500 MG/300ML IV SOLN
1500.0000 mg | INTRAVENOUS | Status: DC
  Administered 2020-08-08 – 2020-08-10 (×3): 1500 mg via INTRAVENOUS
  Filled 2020-08-07 (×4): qty 300

## 2020-08-07 MED ORDER — HEPARIN BOLUS VIA INFUSION
2500.0000 [IU] | Freq: Once | INTRAVENOUS | Status: AC
  Administered 2020-08-07: 2500 [IU] via INTRAVENOUS
  Filled 2020-08-07: qty 2500

## 2020-08-07 NOTE — Progress Notes (Signed)
Had a home visit with [name] [name].  Took him batteries for his hoveround that was funded by [employer].  Paramedic Norlene Duel was able to replace the batteries and the hoveround is now working for [employer].  [name] [name] is appreciative and able to get around a lot better.    [place] [phone]

## 2020-08-07 NOTE — Progress Notes (Signed)
  Pharmacy Antibiotic Note  [name] [name] is a 60 y.o. male admitted on 08/05/2020 with cellulitis.  Pharmacy has been consulted for Zosyn and Vancomycin dosing. 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  Plan:  1) continue Zosyn 3.375 gm EI every 8 hours   2) adjust vancomycin dose to 1500 mg IV every 24 hours  Goal AUC 400-550  Expected AUC: 455.4  SCr used: 1.26  Ke: 0.055 h-1, T1/2: 12.7 h  Css (calculated): 34.0 / 10.1 mcg/mL  Daily SCr while on IV vancomycin    Height: '5\' 7"'$  (170.2 cm) Weight: 83.8 kg (184 lb 11.9 oz) IBW/kg (Calculated) : 66.1  Temp (24hrs), Avg:98.3 F (36.8 C), Min:97.9 F (36.6 C), Max:98.4 F (36.9 C)  Recent Labs  Lab 08/05/20 1841 08/05/20 2239 08/06/20 0403 08/07/20 0406  WBC 7.5  --  6.4 7.1  CREATININE 1.19  --  1.09 1.26*  LATICACIDVEN 1.3 1.9  --   --     Estimated Creatinine Clearance: 67 mL/min (A) (by C-G formula based on SCr of 1.26 mg/dL (H)).    Allergies  Allergen Reactions  . Dulaglutide Anaphylaxis, Diarrhea and Hives  . Ace Inhibitors     hypotension  . Other Itching    Skin itching associated with nitro patch  . Prednisone Rash    Antimicrobials this admission: 02/26 Zosyn >>  02/27 Vancomycin >>   Microbiology results: 02/27 SARS CoV-2: negative  Thank you for allowing pharmacy to be a part of this patient's care.  Vallery Sa, PharmD 08/07/2020 7:10 AM

## 2020-08-07 NOTE — Progress Notes (Signed)
ANTICOAGULATION CONSULT NOTE  Pharmacy Consult for Heparin infusion  Indication: chest pain/ACS  Patient Measurements: Height: '5\' 7"'$  (170.2 cm) Weight: 83.8 kg (184 lb 11.9 oz) IBW/kg (Calculated) : 66.1  Vital Signs: Temp: 98.5 F (36.9 C) (02/28 1028) BP: 112/51 (02/28 1133) Pulse Rate: 65 (02/28 1133)  Labs: Recent Labs    08/05/20 1841 08/06/20 0043 08/06/20 0403 08/06/20 0415 08/06/20 0715 08/06/20 1433 08/06/20 1433 08/06/20 2139 08/07/20 0406 08/07/20 1057  HGB 12.4*  --  11.4*  --   --   --   --   --  12.0*  --   HCT 38.7*  --  35.1*  --   --   --   --   --  37.2*  --   PLT 143*  --  132*  --   --   --   --   --  123*  --   APTT  --   --   --   --   --  37*   < > 60* 66* 51*  LABPROT  --   --   --   --   --  16.5*  --   --   --   --   INR  --   --   --   --   --  1.4*  --   --   --   --   HEPARINUNFRC  --   --   --   --   --  >2.20*  --   --  1.47*  --   CREATININE 1.19  --  1.09  --   --   --   --   --  1.26*  --   TROPONINIHS  --    < >  --  1,190* 1,186*  --   --   --   --  504*   < > = values in this interval not displayed.    Estimated Creatinine Clearance: 67 mL/min (A) (by C-G formula based on SCr of 1.26 mg/dL (H)).   Medical History: Past Medical History:  Diagnosis Date  . Acute on chronic heart failure ([place])   . AKI (acute kidney injury) ([place]) 01/03/2018  . Angina at rest [facility]) 08/13/2017  . Arthritis   . Asthma   . Atherosclerosis   . BPH (benign prostatic hyperplasia)   . Carotid arterial disease ([place])   . Cellulitis of foot, left 09/27/2019  . Charcot's joint of foot, right   . Chronic combined systolic (congestive) and diastolic (congestive) heart failure ([place])    a. 02/2013 EF 50% by LV gram; b. 04/2017 Echo: EF 25-30%. diff HK. Gr2 DD. Mod dil LA/RV. PASP 42mHg.  .Marland KitchenChronic foot pain (Secondary Area of Pain) (Right) 01/29/2018  . COPD (chronic obstructive pulmonary disease) ([place]   . COPD with acute exacerbation ([place] 01/30/2020  .  Coronary artery disease    a. 2013 S/P PCI of LAD ([facility];  b. 03/2013 PCI: RCA 90p ( 2.5 x 23 mm DES); c. 02/2014 Cath: patent RCA stent->Med Rx; d. 04/2017 Cath: LM nl, LAd 30ost/p, 686m, D1 80ost, LCX 90p/m, OM1 85, RCA 70/20p, 8076m0d->Referred for CT Surg-felt to be poor candidate.  . Coronary artery disease of native artery of native heart with stable angina pectoris (HCCFrances[place]/08/2017   [facility]. Nuclear stress test showed inferior wall defect. Cardiac catheterization showed patent LAD  stent with 90% stenosis in the proximal RCA. Ejection fraction was 50%. Status post PCI of the RCA with a 2.5 x 23 mm drug-eluting stent  . Diabetic neuropathy ([place])   . Diabetic ulcer of right foot ([place])   . Hernia   . HFrEF (heart failure with reduced ejection fraction) ([place]) 01/30/2020  . Hyperlipidemia   . Hypertension   . Ischemic cardiomyopathy    a. 04/2017 Echo: EF 25-30%; b. 08/2017 Cardiac MRI ([facility]): EF 19%, sev glob HK. RVEF 20%, mod BAE, triv MR, mild-mod TR. Basal lateral subendocardial infarct (viable), inf/infsept, dist septal ischemia, basal to mid lat peri-infarct ischemia.  . Morbid obesity ([place])   . Neuropathy   . PAD (peripheral artery disease) ([place])    a. Followed by Dr. Lucky Cowboy; b. 08/2010 Periph Angio: RSFA 70-80p (6X60 self-expanding stent); c. 09/2010 Periph Angio: L SFA 100p (7x unknown length self-expanding stent); d. 06/2015 Periph Angio: R SFA short segment occlusion (6x12 self-expanding stent).  . Restless leg syndrome   . Sepsis ([place]) 03/30/2018  . Sleep apnea   . Subclavian artery stenosis, right ([place])   . Syncope and collapse   . Tobacco use    a. 75+ yr hx - still smoking 1ppd, down from 2 ppd.  . Type II diabetes mellitus ([place])   . Unstable angina ([place]) 07/11/2016  . Varicose vein      Assessment: 60 yo male admitted with cellulitis. PMH of DM, CHF, COPD, CAD, and A. Fib (on Eliquis). Troponin  HS elevated at 1190 but now trending down. Pharmacy has been consulted for heparin infusion dosing and monitoring. H&H stable, platelets low at baseline but appears stable  Goal of Therapy:  Heparin level 0.3-0.7 units/ml aPTT 66-102 seconds Monitor platelets by anticoagulation protocol: Yes     Plan:   aPTT subtherapeutic:  Bolus 2500 units heparin  increase heparin infusion rate to 1450 units/hr  Will need to adjust dose based on aPTT levels until aPTT and HL  correlate.   Recheck aPTT in 6 hours after rate change  CBC daily while on heparin.  Continue to monitor H&H and platelets  Vallery Sa, PharmD 08/07/2020 12:41 PM

## 2020-08-07 NOTE — TOC Initial Note (Signed)
Transition of Care [facility]) - Initial/Assessment Note    Patient Details  Name: [name] [name] MRN: [id number] Date of Birth: 1961/03/22  Transition of [facility]) CM/SW Contact:    Pete Pelt, RN Phone Number: 08/07/2020, 10:52 AM  Clinical Narrative:  Readmission Prevention Screen complete.  TOC met with patient, no supports at bedside.  TOC introduced role and explained that discharge planning would be discussed.  PCP is Modena Morrow, MD in [place], [place].  Patient typically drives to his appointments but his girlfriend/friends can drive him if needed.  Pharmacy is [facility] in [place], [place].  Patient stated there are no barriers to obtaining medications.  Currently receiving Home Health services.  Per chart review, patient is active with [facility], left VM for [facility] rep to confirm.  DME-patient states he has prosthetic leg, no issues; walker; crutches; shower chair and electric wheelchair.  Patient currently on 2L O2 acute.  Will follow for this need. No further concerns at this time.  TOC encouraged patient to contact if needed.  TOC will continue to follow patient for support and facilitate return to home when stable.                Expected Discharge Plan: [place] Barriers to Discharge: No Barriers Identified   Patient Goals and CMS Choice     Choice offered to / list presented to : NA  Expected Discharge Plan and Services Expected Discharge Plan: [place] Acute Care Choice: Resumption of Svcs/PTA Provider Living arrangements for the past 2 months: Single Family Home                                      Prior Living Arrangements/Services Living arrangements for the past 2 months: Single Family Home   Patient language and need for interpreter reviewed:: Yes Do you feel safe going back to the place where you live?: Yes      Need for Family Participation in Patient Care: Yes (Comment) Care giver support system in  place?: Yes (comment) Current home services: DME,Home RN Criminal Activity/Legal Involvement Pertinent to Current Situation/Hospitalization: No - Comment as needed  Activities of Daily Living Home Assistive Devices/Equipment: Walker (specify type),Wheelchair,Nebulizer,Shower chair with back,Bedside commode/3-in-1,Prosthesis ADL Screening (condition at time of admission) Patient's cognitive ability adequate to safely complete daily activities?: Yes Is the patient deaf or have difficulty hearing?: No Does the patient have difficulty seeing, even when wearing glasses/contacts?: No Does the patient have difficulty concentrating, remembering, or making decisions?: No Patient able to express need for assistance with ADLs?: Yes Does the patient have difficulty dressing or bathing?: No Independently performs ADLs?: Yes (appropriate for developmental age) Does the patient have difficulty walking or climbing stairs?: No Weakness of Legs: Both Weakness of Arms/Hands: Both  Permission Sought/Granted Permission sought to share information with : Facility Art therapist granted to share information with : Yes, Verbal Permission Granted     Permission granted to share info w AGENCY: Home Health Agency        Emotional Assessment Appearance:: Appears stated age Attitude/Demeanor/Rapport: Gracious,Engaged Affect (typically observed): Accepting,Appropriate,Calm,Pleasant Orientation: : Oriented to Self,Oriented to Place,Oriented to  Time,Oriented to Situation Alcohol / Substance Use: Not Applicable Psych Involvement: No (comment)  Admission diagnosis:  Cellulitis [L03.90] Diabetic foot infection ([place]) [K86.381, L08.9] Patient Active Problem List   Diagnosis Date Noted  .  Palliative care by specialist   . CHF (congestive heart failure) ([place]) 06/18/2020  . Chronic anticoagulation 06/17/2020  . Chronic venous insufficiency 02/10/2020  . Persistent atrial fibrillation ([place])  01/19/2020  . Chronic bilateral low back pain with sciatica 10/08/2019  . Medication management contract signed 10/08/2019  . Chronic hyponatremia 09/27/2019  . CAD (coronary artery disease) 08/31/2019  . Open toe wound 10/01/2018  . Venous ulcer of left leg ([place]) 08/19/2018  . 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[place]) 04/28/2018  . Abnormal MRI, shoulder (Right) 02/23/2018  . Abnormal MRI, cervical spine (2016) 02/23/2018  . DDD (degenerative disc disease), cervical 02/23/2018  . Cervical foraminal stenosis (Bilateral) 02/23/2018  . Cervical central spinal stenosis 02/23/2018  . Cervical facet hypertrophy 02/23/2018  . Arthralgia of acromioclavicular joint (Right) 02/23/2018  . Osteoarthritis of  AC (acromioclavicular) joint (Right) 02/23/2018  . Biceps tendinosis of shoulder (Right) 02/23/2018  . Tendinopathy of rotator cuff (Right) 02/23/2018  . Vitamin D deficiency 02/23/2018  . Atherosclerosis of native arteries of the extremities with ulceration ([place]) 02/15/2018  . Peripheral edema 02/05/2018  . Status post peripherally inserted central catheter (PICC) central line placement 02/04/2018  . Chronic lower extremity pain (Primary Area of Pain) (Right) 01/29/2018  . Chronic shoulder pain [facility] Area of Pain) (Right) 01/29/2018  . Chronic pain syndrome 01/29/2018  . Long term current use of opiate analgesic 01/29/2018  . Medication monitoring encounter 01/29/2018  . Disorder of skeletal system 01/29/2018  . Problems influencing health status 01/29/2018  . NSTEMI (non-ST elevated myocardial infarction) ([place]) 11/21/2017  . HLD (hyperlipidemia) 09/10/2017  . Angina pectoris ([place]) 09/08/2017  . Lymphedema 05/02/2017  . Chest pain 07/11/2016  . Foot ulcer (Right)   . Stable angina (HCC)   . Pressure injury of skin 05/06/2016  . Diabetic foot infection ([place]) 05/05/2016  . Type 2 diabetes mellitus with other specified complication ([place]) 57/89/7847  . Cellulitis  11/21/2015  . Morbid obesity ([place]) 11/17/2014  . PAD (peripheral artery disease) ([place])   . Hypertension   . Tobacco use   . COPD (chronic obstructive pulmonary disease) ([place]) 05/19/2012  . Diabetic neuropathy ([place]) 05/19/2012  . Peripheral vascular disease ([place]) 03/27/2012   PCP:  [name], MD Pharmacy:   [facility], [place] [address] Phone: [phone] Fax: [place] [facility] [address] [place] [address] Phone: [phone] Fax: [phone]     Social Determinants of Health (SDOH) Interventions    Readmission Risk Interventions Readmission Risk Prevention Plan 08/07/2020 06/30/2020 11/09/2018  Transportation Screening Complete Complete -  HRI or [place] Work Consult for [place] Planning/Counseling - - Not Complete  SW consult not completed comments - - na  Palliative Care Screening - - Not Applicable  Medication Review (RN Care Manager) Complete Complete -  PCP or Specialist appointment within 3-5 days of discharge Complete Complete -  [place] or Home Care Consult Complete Complete -  SW Recovery Care/Counseling Consult Complete Complete -  Palliative Care Screening Not Applicable Not Applicable -  [place] Not Applicable Not Applicable -  Some recent data might be hidden

## 2020-08-07 NOTE — Progress Notes (Signed)
ANTICOAGULATION CONSULT NOTE - Initial Consult  Pharmacy Consult for Heparin infusion  Indication: chest pain/ACS  Allergies  Allergen Reactions  . Dulaglutide Anaphylaxis, Diarrhea and Hives  . Ace Inhibitors     hypotension  . Other Itching    Skin itching associated with nitro patch  . Prednisone Rash    Patient Measurements: Height: '5\' 7"'$  (170.2 cm) Weight: 83.8 kg (184 lb 11.9 oz) IBW/kg (Calculated) : 66.1 Heparin Dosing Weight: 83.7  Vital Signs: Temp: 98.4 F (36.9 C) (02/28 0523) BP: 126/60 (02/28 0523) Pulse Rate: 55 (02/28 0523)  Labs: Recent Labs    08/05/20 1841 08/06/20 0043 08/06/20 0205 08/06/20 0403 08/06/20 0415 08/06/20 0715 08/06/20 1433 08/06/20 2139 08/07/20 0406  HGB 12.4*  --   --  11.4*  --   --   --   --  12.0*  HCT 38.7*  --   --  35.1*  --   --   --   --  37.2*  PLT 143*  --   --  132*  --   --   --   --  123*  APTT  --   --   --   --   --   --  37* 60* 66*  LABPROT  --   --   --   --   --   --  16.5*  --   --   INR  --   --   --   --   --   --  1.4*  --   --   HEPARINUNFRC  --   --   --   --   --   --  >2.20*  --  1.47*  CREATININE 1.19  --   --  1.09  --   --   --   --  1.26*  TROPONINIHS  --    < > 863*  --  1,190* 1,186*  --   --   --    < > = values in this interval not displayed.    Estimated Creatinine Clearance: 67 mL/min (A) (by C-G formula based on SCr of 1.26 mg/dL (H)).   Medical History: Past Medical History:  Diagnosis Date  . Acute on chronic heart failure ([place])   . AKI (acute kidney injury) ([place]) 01/03/2018  . Angina at rest [facility]) 08/13/2017  . Arthritis   . Asthma   . Atherosclerosis   . BPH (benign prostatic hyperplasia)   . Carotid arterial disease ([place])   . Cellulitis of foot, left 09/27/2019  . Charcot's joint of foot, right   . Chronic combined systolic (congestive) and diastolic (congestive) heart failure ([place])    a. 02/2013 EF 50% by LV gram; b. 04/2017 Echo: EF 25-30%. diff HK. Gr2 DD. Mod dil LA/RV.  PASP 29mHg.  .Marland KitchenChronic foot pain (Secondary Area of Pain) (Right) 01/29/2018  . COPD (chronic obstructive pulmonary disease) ([place]   . COPD with acute exacerbation ([place] 01/30/2020  . Coronary artery disease    a. 2013 S/P PCI of LAD ([facility];  b. 03/2013 PCI: RCA 90p ( 2.5 x 23 mm DES); c. 02/2014 Cath: patent RCA stent->Med Rx; d. 04/2017 Cath: LM nl, LAd 30ost/p, 615m, D1 80ost, LCX 90p/m, OM1 85, RCA 70/20p, 8019m0d->Referred for CT Surg-felt to be poor candidate.  . Coronary artery disease of native artery of native heart with stable angina pectoris (HCCLa Huerta/08/2017   [facility]. Nuclear stress test showed inferior wall defect. Cardiac catheterization showed patent LAD stent with 90% stenosis in the proximal RCA. Ejection fraction was 50%. Status post PCI of the RCA with a 2.5 x 23 mm drug-eluting stent  . Diabetic neuropathy ([place])   . Diabetic ulcer of right foot ([place])   . Hernia   . HFrEF (heart failure with reduced ejection fraction) ([place]) 01/30/2020  . Hyperlipidemia   . Hypertension   . Ischemic cardiomyopathy    a. 04/2017 Echo: EF 25-30%; b. 08/2017 Cardiac MRI ([facility]): EF 19%, sev glob HK. RVEF 20%, mod BAE, triv MR, mild-mod TR. Basal lateral subendocardial infarct (viable), inf/infsept, dist septal ischemia, basal to mid lat peri-infarct ischemia.  . Morbid obesity ([place])   . Neuropathy   . PAD (peripheral artery disease) ([place])    a. Followed by Dr. Lucky Cowboy; b. 08/2010 Periph Angio: RSFA 70-80p (6X60 self-expanding stent); c. 09/2010 Periph Angio: L SFA 100p (7x unknown length self-expanding stent); d. 06/2015 Periph Angio: R SFA short segment occlusion (6x12 self-expanding stent).  . Restless leg syndrome   . Sepsis ([place]) 03/30/2018  . Sleep apnea   . Subclavian artery stenosis, right ([place])   . Syncope and collapse   . Tobacco use    a. 75+ yr hx - still smoking 1ppd, down from 2 ppd.  . Type II diabetes  mellitus ([place])   . Unstable angina ([place]) 07/11/2016  . Varicose vein      Assessment: 60 yo male admitted with cellulitis. PMH of DM, CHF, COPD, CAD, and A. Fib (on Eliquis). Troponin HS elevated at 1190 but no complaints of chest pain according to RN. Pharmacy has been consulted for heparin infusion dosing and monitoring. Last dose of apixaban 10/27 @ 1023.   Goal of Therapy:  Heparin level 0.3-0.7 units/ml aPTT 66-102 seconds Monitor platelets by anticoagulation protocol: Yes   0227 2139 aPTT 60, subtherapeutic 0228 0406 aPTT 66, therapeutic, HL 1.47   Plan:   Continue heparin infusion at 1150 units/hr Will need to adjust dose based on aPTT levels until aPTT and HL  correlate.  Recheck aPTT in 6 hours to confirm and HL and CBC daily while on heparin. Continue to monitor H&H and platelets  Renda Rolls, PharmD, [facility] 08/07/2020 5:49 AM

## 2020-08-07 NOTE — Progress Notes (Signed)
Progress Note  Patient Name: [name] [name] Date of Encounter: 08/07/2020  Primary Cardiologist: Ida Rogue, MD   Subjective   Preference at time of consultation was for medical management.  Today, he is joined by 2 family members.  We discussed his current echo and comorbid conditions.  After further discussion, patient is agreeable to possible CABG/intervention in the future.  He denies further chest pain.  He does not normally wear oxygen at home and reports that he does not have shortness of breath on current nasal cannula oxygen.  He becomes short of breath if nasal cannula oxygen removed.  He reports pain of the left lower extremity and increasing erythema and warmth.  He also reports a cracked toe.  He states he has not yet been seen by infectious disease.  Inpatient Medications    Scheduled Meds: . aspirin  81 mg Oral Daily  . atorvastatin  80 mg Oral Daily  . carvedilol  3.125 mg Oral BID WC  . clopidogrel  75 mg Oral Daily  . digoxin  0.125 mg Oral Daily  . DULoxetine  30 mg Oral Daily  . ezetimibe  10 mg Oral Daily  . furosemide  40 mg Intravenous BID  . gabapentin  800 mg Oral QID  . insulin aspart  0-15 Units Subcutaneous TID WC  . isosorbide mononitrate  30 mg Oral Daily  . mometasone-formoterol  2 puff Inhalation BID  . pantoprazole  40 mg Oral Daily  . potassium chloride  10 mEq Oral BID  . sodium chloride flush  3 mL Intravenous Q12H  . spironolactone  12.5 mg Oral Daily  . tamsulosin  0.4 mg Oral QPC supper  . traZODone  150 mg Oral QHS  . Vericiguat  5 mg Oral Daily   Continuous Infusions: . heparin 1,450 Units/hr (08/07/20 1306)  . piperacillin-tazobactam (ZOSYN)  IV 3.375 g (08/07/20 0525)  . [START ON 08/08/2020] vancomycin     PRN Meds: acetaminophen **OR** [DISCONTINUED] acetaminophen, albuterol, nitroGLYCERIN, oxyCODONE, polyethylene glycol   Vital Signs    Vitals:   08/07/20 0500 08/07/20 0523 08/07/20 1028 08/07/20 1133  BP:  126/60  125/68 (!) 112/51  Pulse:  (!) 55 (!) 57 65  Resp:  '18 16 18  '$ Temp:  98.4 F (36.9 C) 98.5 F (36.9 C)   TempSrc:      SpO2:  100% 100% 98%  Weight: 83.8 kg 83.8 kg    Height:        Intake/Output Summary (Last 24 hours) at 08/07/2020 1445 Last data filed at 08/07/2020 1101 Gross per 24 hour  Intake 590.2 ml  Output 2300 ml  Net -1709.8 ml   Last 3 Weights 08/07/2020 08/07/2020 08/06/2020  Weight (lbs) 184 lb 11.9 oz 184 lb 11.9 oz 189 lb 13.1 oz  Weight (kg) 83.8 kg 83.8 kg 86.1 kg  Some encounter information is confidential and restricted. Go to Review Flowsheets activity to see all data.      Telemetry    Atrial fibrillation with controlled ventricular rate 50s to 60s- Personally Reviewed  ECG    No new tracings- Personally Reviewed  Physical Exam   GEN: No acute distress.  Joined by 2 family members. Neck:  JVD difficult to assess due to position of patient.  Nasal cannula oxygen in place. Cardiac: IRIR, no murmurs, rubs, or gallops.  Respiratory: Bilaterally reduced breath sounds. GI: Soft, nontender, non-distended  MS:  S/p right BKA.  Left lower extremity with erythema and cyanosis noted,  as well as cracked toe.  Neuro:  Nonfocal  Psych: Normal affect   Labs    High Sensitivity Troponin:   Recent Labs  Lab 08/06/20 0043 08/06/20 0205 08/06/20 0415 08/06/20 0715 08/07/20 1057  TROPONINIHS 652* 863* 1,190* 1,186* 504*      Chemistry Recent Labs  Lab 08/05/20 1841 08/06/20 0403 08/06/20 0715 08/07/20 0406  NA 135 135  --  139  K 4.0 4.2  --  3.8  CL 92* 96*  --  100  CO2 31 32  --  29  GLUCOSE 185* 97  --  95  BUN 10 10  --  12  CREATININE 1.19 1.09  --  1.26*  CALCIUM 9.1 8.8*  --  9.0  PROT 6.7 6.2*  --  6.1*  ALBUMIN 3.6 3.1*  --  3.2*  AST 17 33  --  18  ALT 9 9  --  8  ALKPHOS 92 78  --  74  BILITOT 1.6* 1.6* 1.6* 2.0*  GFRNONAA >60 >60  --  >60  ANIONGAP 12 7  --  10     Hematology Recent Labs  Lab 08/05/20 1841 08/06/20 0403  08/07/20 0406  WBC 7.5 6.4 7.1  RBC 4.48 4.06* 4.29  HGB 12.4* 11.4* 12.0*  HCT 38.7* 35.1* 37.2*  MCV 86.4 86.5 86.7  MCH 27.7 28.1 28.0  MCHC 32.0 32.5 32.3  RDW 21.2* 21.0* 21.1*  PLT 143* 132* 123*    BNP Recent Labs  Lab 08/05/20 1844  BNP 1,069.1*     DDimer No results for input(s): DDIMER in the last 168 hours.   Radiology    DG Chest Portable 1 View  Result Date: 08/05/2020 CLINICAL DATA:  Shortness of breath. Swelling of the lower extremities. EXAM: PORTABLE CHEST 1 VIEW COMPARISON:  06/25/2020 FINDINGS: Cardiomegaly. Aortic atherosclerosis. Pulmonary venous hypertension with mild interstitial edema. No visible effusion. Findings consistent with congestive heart failure. Improved appearance compared to the study of 10 days ago. IMPRESSION: Congestive heart failure. Improved since the study of 10 days ago. Electronically Signed   By: Nelson Chimes M.D.   On: 08/05/2020 22:58   DG Foot Complete Left  Result Date: 08/05/2020 CLINICAL DATA:  Shortness of breath and foot swelling left foot laceration EXAM: LEFT FOOT - COMPLETE 3+ VIEW COMPARISON:  None. FINDINGS: There is no evidence of fracture or dislocation. There is no evidence of arthropathy or other focal bone abnormality. Dorsal soft tissue swelling is seen. Calcaneal enthesophytes are noted. IMPRESSION: No acute osseous abnormality. Electronically Signed   By: Prudencio Pair M.D.   On: 08/05/2020 23:03    Cardiac Studies   Echo 06/17/2020 1. Left ventricular ejection fraction, by estimation, is <20%. Left  ventricular ejection fraction by PLAX is 14 %. The left ventricle has  severely decreased function. The left ventricle demonstrates global  hypokinesis. The left ventricular internal  cavity size was severely dilated. Left ventricular diastolic parameters  are indeterminate.  2. Right ventricular systolic function is severely reduced. The right  ventricular size is moderately enlarged. There is severely elevated   pulmonary artery systolic pressure. The estimated right ventricular  systolic pressure is [id number] mmHg.  3. Left atrial size was severely dilated.  4. Right atrial size was severely dilated.  5. Tricuspid valve regurgitation is mild to moderate.  6. The inferior vena cava is dilated in size with <50% respiratory  variability, suggesting right atrial pressure of 15 mmHg.   Cardiac catheterization 01/12/2020 Conclusion  Prox RCA-1 lesion is 80% stenosed.  Prox RCA-2 lesion is 20% stenosed.  Prox RCA to Mid RCA lesion is 80% stenosed.  Prox LAD lesion is 20% stenosed.  1st Diag lesion is 80% stenosed.  Mid LAD lesion is 20% stenosed.  Dist LAD lesion is 60% stenosed.  Prox Cx to Mid Cx lesion is 90% stenosed.  LPAV lesion is 95% stenosed.  1. Significant underlying three-vessel coronary artery disease. Patent proximal LAD stent with mild in-stent restenosis. Moderate diffuse disease in the distal LAD. Significant stenosis in the proximal left circumflex at the origin of the posterior AV groove artery which is also heavily diseased at the ostium. This is a bifurcation lesion and heavily calcified. RCA stent is patent. However, there is significant proximal disease in the whole mid to distal segment is diffusely diseased and small caliber. 2. Left ventricular angiography was not performed. EF was severe reduced by echo. 3. Moderately elevated left ventricular end-diastolic pressure at 28 mmHg  Recommendations: The patient's cardiomyopathy seems to be out of proportion to his coronary artery disease as the LAD itself does not seem to have obstructive disease at the present time. The RCA is diffusely diseased and too small to stent. The only target for revascularization would be the left circumflex but it is a calcified bifurcation lesion and likely requires atherectomy. In order to do this safely, the patient has to be optimized from a heart failure standpoint as he appears to be  volume overloaded. Recommend intravenous diuresis. If limited by hypotension, we might need to consider inotropic support and small dose norepinephrine drip initially. I added small dose digoxin. Treat underlying atrial fibrillation. Resume heparin drip 8 hours after sheath pull.   Coronary Diagrams   Diagnostic Dominance: Co-dominant    LE arterial study - 09/08/19 Summary:  Left: Patent Left LE arterial system from EIA to distal PTA/ATA. New SFA  to TP trunk stent is patent with no evidence of stenosis. Spot check of  lower extremity venous system shows no evidence of DVT. Lymph nodes in  groin show multiple enlarged nodes.   ABIs- 08/11/19 Summary:  Left: Resting left ankle-brachial index indicates mild left lower  extremity arterial disease. The left toe-brachial index is abnormal.   08/2017 Cardiac MRI: [facility] 1. The left ventricle is moderately dilated in cavity size with normal wall thickness. Global systolic function is severely reduced with an LV ejection fraction calculated at 19%. There is severe hypokinesis globally. 2. The right ventricle is moderately dilated in cavity size with normal wall thickness. Global RV systolic function is severely reduced with an RVEF calculated at 20%.  3. Both atria are moderately enlarged in size. 4. The aortic valve is trileaflet in morphology. There is no significant aortic valve stenosis or regurgitation.  5. There is trivial mitral regurgitation. There is mild-moderate tricuspid regurgitation. 6. Delayed enhancement imaging is mildly abnormal. There is a small thin layer of subendocardial infarction in the basal lateral wall. There is significant viability in all coronary territories. 7. Adenosine stress perfusion imaging is abnormal with stress perfusion defects indicating inducible myocardial ischemia in: a) the entire inferior and inferoseptal walls, consistent with the RCA perfusion territory. b) the  basal-to-mid lateral wall, suggestive of peri-infarct ischemia in the LCx perfusion territory. c) the distal septal wall, consistent with the distal LAD territory. 8. No intra-cardiac thrombus visualized. 9. There is a small circumferential pericardial effusion. There are small bilateral pleural effusions.   Patient Profile     60 y.o.  male multivessel with history of CAD s/p PCI and previously considered for CABG at 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  Assessment & Plan    NSTEMI History of 3v CAD / ICM --Reported CP and SOB at admission x24h.  No further chest pain.  Still short of breath without nasal cannula oxygen.  Admitted with cellulitis and reported an episode of CP with HS Tn peaking at 1190.0, now down-trending. EKG without acute ST/T changes. Started on IV heparin for at least 48h. Previously has refused CABG at [facility]. 01/2020 LHC with severe multivessel CAD. Atherectomy of LCx considered with pt leaving AMA.    He is now agreeable to intervention once clearing his left lower extremity infection.  Continue ASA, Plavix, beta-blocker, Imdur, and nitro PRN for CP.  Statin and Zetia should be continued for secondary prevention of CAD and PAD.     Acute on chronic systolic congestive heart failure Pulmonary hypertension, ICM --Volume overloaded at presentation. NYHA class III. EF <20%. Continue IV diuresis with lasix '40mg'$  BID as renal function allows. Monitor I/O, daily wt. Net 1.5L for admission and -1.3L yesterday. Daily BMET. Cr 1.09  1.26 and bumped overnight.  Given bump in renal function, consider holding spironolactone.  Daily  BMET.  Replete electrolytes.  Continue Coreg, Imdur, digoxin, verquvo.  Monitor digoxin level as below.  No ACE/ARB/Entresto due to CKD and hypotension.   Permanent Afib --Continue Coreg, digoxin.  Denies tachypalpitations.  Ventricular rate well controlled.  Continue to monitor digoxin level. 08/06/20 digoxin level 0.9. Monitor Cr on digoxin. Updating TSH. Monitor electrolytes-replete potassium. CHA2DS2VASc score of at least  4 (CHF, HTN, DM2, vascular) with recommendation for ongoing anticoagulation to reduce the risk of thromboembolic event. Eliquis on hold due to IV heparin.  Plan for continued IV heparin for at least 48 hours.  Started on ASA for at least 1 week.  Restart Eliquis once discontinue IV heparin. Plan for ASA to be discontinued in 1 week. Daily CBC and BMET.  Hypokalemia --Continue to replete with goal 4.0. Most recent K 3.8.   HLD --Continue statin and Zetia  HTN --Continue home medications  DM2 --SSI, Per IM.  Left lower extremity cellulitis PAD s/p R BKA for diabetic ulcer --Recurrent left lower extremity cellulitis. S/p R BKDA for DM2 ulcer and MRSA infection of RLE. Continue antibiotics.    Consider consulting infectious disease.  Will defer further management to internal medicine.  Anemia --Daily CBC. Continue to montior. Most recent H& H stable.  COPD Tobacco use --Smoking cessation recommended.   For questions or updates, please contact [place] Please consult [email or web address] for contact info under        Signed, Arvil Chaco, PA-C  08/07/2020, 2:45 PM

## 2020-08-07 NOTE — Progress Notes (Signed)
[name] [name]  [id number] DOB: Jan 14, 1961 DOA: 08/05/2020 PCP: [name], MD    Brief Narrative:  60 year old with a history of CHF, CAD, HLD, PAD, DM 2, COPD, right BKA, cervical spinal stenosis, chronic lymphedema, venous insufficiency, HTN, atrial fibrillation, and obesity who presented to the ER with a left foot wound.  Patient reported dryness and cracking of his left foot which had worsened over the past couple of weeks.  He began to notice a foul odor coming from the foot the day of his admission when he found significant drainage between his first and second toes.  Significant Events:  2/26 admit via ER  Antimicrobials:  Vancomycin 2/26 > Zosyn 2/26 >  DVT prophylaxis: Eliquis > IV heparin   Subjective: Afebrile.  Blood pressure stabilizing.  Some bradycardia appreciated with heart rates 50-75.  The patient reports that he feels better overall.  Denies any current chest pain or shortness of breath.  Appetite improving.  Reports some ongoing pain in his left foot.  Assessment & Plan:  Diabetic foot infection No evidence on plain film x-ray to suggest osteomyelitis - ABIs 07/17/20 0.73 - had L LE angiogram 07/17/20 demonstrated moderate disease of the common femoral artery and the first 4 to 5 cm of the superficial femoral artery in the 60 to 70% range. This tracked down to just above the previously placed stent. All of the stents in the SFA and popliteal artery are widely patent with less than 10% stenosis. There was then two-vessel runoff distally with both posterior tibial and peroneal arteries which were patent without significant stenosis - cont broad spectrum abx coverage - may need to consider MRI of foot if pain persists   NSTEMI / Elevated troponin - Complex hx of CAD NSTEMI in setting of acute distress of cellulitis - has had some intermittent cp on hx - hx of LAD and RCA interventions 2013 and 2014 - 2018 cath noted severe multivessel CAD w/ surgical referral but pt  refused CABG - last cath Aug 2021 noted patent proximal LAD stent, moderate distal LAD disease, significant stenosis in the proximal left circumflex, bifurcation lesions, patent stent to the RCA, significant proximal disease in the mid to distal segment - Cards following - cont medical care to include heparin gtt for 48hrs   Hypotension  Resolved   Chronic Severe Ischemic Systolic CHF and Severe R Heart Failure  EF < 20% per TTE Jan 2022 - care per Cards - diuresis underway - net negative ~2L thus far - no gross severe overload at present -watch renal function with ongoing diuresis  DM 2 CBG currently controlled   PAD s/p L SFA stenting and R BKA No clinical findings of acute ischemia left lower extremity  HLD Cont medical tx  COPD Ongoing tobacco abuse -counseled on absolute need to abstain  Chronic atrial fibrillation resume usual Eliquis dosing once heparin gtt discontinued   Chronic neuropathic pain  Hx of noncompliance Often leaves AMA   Code Status: FULL CODE Family Communication: Spoke with family at bedside Status is: Inpatient  Remains inpatient appropriate because:Inpatient level of care appropriate due to severity of illness   Dispo: The patient is from: Home              Anticipated d/c is to: Home              Patient currently is not medically stable to d/c.   Difficult to place patient No   Consultants:  Cardiology  Objective: Blood pressure 126/60, pulse (!) 55, temperature 98.4 F (36.9 C), resp. rate 18, height '5\' 7"'$  (1.702 m), weight 83.8 kg, SpO2 100 %.  Intake/Output Summary (Last 24 hours) at 08/07/2020 0938 Last data filed at 08/07/2020 0727 Gross per 24 hour  Intake 820.2 ml  Output 2950 ml  Net -2129.8 ml   Filed Weights   08/06/20 0641 08/07/20 0500 08/07/20 0523  Weight: 86.1 kg 83.8 kg 83.8 kg    Examination: General: No acute respiratory distress Lungs: Clear to auscultation bilaterally -no wheezing Cardiovascular: Regular  rate and rhythm without murmur gallop or rub  Abdomen: NT/ND, soft, BS positive, no rebound Extremities: trace L LE edema - wound dry at this time - L LE warm to touch and red to knee circumferentially -significant chronic skin changes across foot with multiple calluses, dry skin, and cracks consistent with tinea pedis  CBC: Recent Labs  Lab 08/05/20 1841 08/06/20 0403 08/07/20 0406  WBC 7.5 6.4 7.1  NEUTROABS 6.1  --   --   HGB 12.4* 11.4* 12.0*  HCT 38.7* 35.1* 37.2*  MCV 86.4 86.5 86.7  PLT 143* 132* [id number]*   Basic Metabolic Panel: Recent Labs  Lab 08/05/20 1841 08/06/20 0403 08/07/20 0406  NA 135 135 139  K 4.0 4.2 3.8  CL 92* 96* 100  CO2 31 32 29  GLUCOSE 185* 97 95  BUN '10 10 12  '$ CREATININE 1.19 1.09 1.26*  CALCIUM 9.1 8.8* 9.0  MG 1.9  --  2.1   GFR: Estimated Creatinine Clearance: 67 mL/min (A) (by C-G formula based on SCr of 1.26 mg/dL (H)).  Liver Function Tests: Recent Labs  Lab 08/05/20 1841 08/06/20 0403 08/06/20 0715 08/07/20 0406  AST 17 33  --  18  ALT 9 9  --  8  ALKPHOS 92 78  --  74  BILITOT 1.6* 1.6* 1.6* 2.0*  PROT 6.7 6.2*  --  6.1*  ALBUMIN 3.6 3.1*  --  3.2*    Coagulation Profile: Recent Labs  Lab 08/06/20 1433  INR 1.4*    HbA1C: Hemoglobin A1C  Date/Time Value Ref Range Status  04/04/2013 04:16 AM 9.8 (H) 4.2 - 6.3 % Final    Comment:    The American Diabetes Association recommends that a primary goal of therapy should be <7% and that physicians should reevaluate the treatment regimen in patients with HbA1c values consistently >8%.    Hgb A1c MFr Bld  Date/Time Value Ref Range Status  08/06/2020 04:03 AM 6.5 (H) 4.8 - 5.6 % Final    Comment:    (NOTE) Pre diabetes:          5.7%-6.4%  Diabetes:              >6.4%  Glycemic control for   <7.0% adults with diabetes   06/17/2020 06:00 AM 6.1 (H) 4.8 - 5.6 % Final    Comment:    (NOTE) Pre diabetes:          5.7%-6.4%  Diabetes:              >6.4%  Glycemic  control for   <7.0% adults with diabetes     CBG: Recent Labs  Lab 08/06/20 0802 08/06/20 1216 08/06/20 1708 08/06/20 2327 08/07/20 0758  GLUCAP 134* 113* 111* 102* 106*     Scheduled Meds: . aspirin  81 mg Oral Daily  . atorvastatin  80 mg Oral Daily  . carvedilol  3.125 mg Oral BID WC  .  clopidogrel  75 mg Oral Daily  . digoxin  0.125 mg Oral Daily  . DULoxetine  30 mg Oral Daily  . ezetimibe  10 mg Oral Daily  . furosemide  40 mg Intravenous BID  . gabapentin  800 mg Oral QID  . insulin aspart  0-15 Units Subcutaneous TID WC  . isosorbide mononitrate  30 mg Oral Daily  . mometasone-formoterol  2 puff Inhalation BID  . pantoprazole  40 mg Oral Daily  . potassium chloride  10 mEq Oral BID  . sodium chloride flush  3 mL Intravenous Q12H  . spironolactone  12.5 mg Oral Daily  . tamsulosin  0.4 mg Oral QPC supper  . traZODone  150 mg Oral QHS  . Vericiguat  5 mg Oral Daily   Continuous Infusions: . heparin 1,150 Units/hr (08/07/20 0329)  . piperacillin-tazobactam (ZOSYN)  IV 3.375 g (08/07/20 0525)  . vancomycin Stopped (08/07/20 0101)     LOS: 1 day   Cherene Altes, MD [facility] Office  [phone] Pager - Text Page per Amion  If 7PM-7AM, please contact night-coverage per Amion 08/07/2020, 9:38 AM

## 2020-08-07 NOTE — Progress Notes (Signed)
ANTICOAGULATION CONSULT NOTE  Pharmacy Consult for Heparin infusion  Indication: chest pain/ACS  Patient Measurements: Height: '5\' 7"'$  (170.2 cm) Weight: 83.8 kg (184 lb 11.9 oz) IBW/kg (Calculated) : 66.1  Vital Signs: Temp: 98.1 F (36.7 C) (02/28 1500) BP: 130/67 (02/28 1500) Pulse Rate: 58 (02/28 1500)  Labs: Recent Labs    08/05/20 1841 08/06/20 0043 08/06/20 0403 08/06/20 0415 08/06/20 0715 08/06/20 1433 08/06/20 2139 08/07/20 0406 08/07/20 1057 08/07/20 1838  HGB 12.4*  --  11.4*  --   --   --   --  12.0*  --   --   HCT 38.7*  --  35.1*  --   --   --   --  37.2*  --   --   PLT 143*  --  132*  --   --   --   --  123*  --   --   APTT  --   --   --   --   --  37*   < > 66* 51* 78*  LABPROT  --   --   --   --   --  16.5*  --   --   --   --   INR  --   --   --   --   --  1.4*  --   --   --   --   HEPARINUNFRC  --   --   --   --   --  >2.20*  --  1.47*  --   --   CREATININE 1.19  --  1.09  --   --   --   --  1.26*  --   --   TROPONINIHS  --    < >  --  1,190* 1,186*  --   --   --  504*  --    < > = values in this interval not displayed.    Estimated Creatinine Clearance: 67 mL/min (A) (by C-G formula based on SCr of 1.26 mg/dL (H)).   Medical History: Past Medical History:  Diagnosis Date  . Acute on chronic heart failure ([place])   . AKI (acute kidney injury) ([place]) 01/03/2018  . Angina at rest [facility]) 08/13/2017  . Arthritis   . Asthma   . Atherosclerosis   . BPH (benign prostatic hyperplasia)   . Carotid arterial disease ([place])   . Cellulitis of foot, left 09/27/2019  . Charcot's joint of foot, right   . Chronic combined systolic (congestive) and diastolic (congestive) heart failure ([place])    a. 02/2013 EF 50% by LV gram; b. 04/2017 Echo: EF 25-30%. diff HK. Gr2 DD. Mod dil LA/RV. PASP 6mHg.  .Marland KitchenChronic foot pain (Secondary Area of Pain) (Right) 01/29/2018  . COPD (chronic obstructive pulmonary disease) ([place]   . COPD with acute exacerbation ([place] 01/30/2020  .  Coronary artery disease    a. 2013 S/P PCI of LAD ([facility];  b. 03/2013 PCI: RCA 90p ( 2.5 x 23 mm DES); c. 02/2014 Cath: patent RCA stent->Med Rx; d. 04/2017 Cath: LM nl, LAd 30ost/p, 68m, D1 80ost, LCX 90p/m, OM1 85, RCA 70/20p, 802m0d->Referred for CT Surg-felt to be poor candidate.  . Coronary artery disease of native artery of native heart with stable angina pectoris ([place]/08/2017   [facility] Nuclear stress test showed inferior wall defect. Cardiac catheterization showed patent LAD  stent with 90% stenosis in the proximal RCA. Ejection fraction was 50%. Status post PCI of the RCA with a 2.5 x 23 mm drug-eluting stent  . Diabetic neuropathy ([place])   . Diabetic ulcer of right foot ([place])   . Hernia   . HFrEF (heart failure with reduced ejection fraction) ([place]) 01/30/2020  . Hyperlipidemia   . Hypertension   . Ischemic cardiomyopathy    a. 04/2017 Echo: EF 25-30%; b. 08/2017 Cardiac MRI ([facility]): EF 19%, sev glob HK. RVEF 20%, mod BAE, triv MR, mild-mod TR. Basal lateral subendocardial infarct (viable), inf/infsept, dist septal ischemia, basal to mid lat peri-infarct ischemia.  . Morbid obesity ([place])   . Neuropathy   . PAD (peripheral artery disease) ([place])    a. Followed by Dr. Lucky Cowboy; b. 08/2010 Periph Angio: RSFA 70-80p (6X60 self-expanding stent); c. 09/2010 Periph Angio: L SFA 100p (7x unknown length self-expanding stent); d. 06/2015 Periph Angio: R SFA short segment occlusion (6x12 self-expanding stent).  . Restless leg syndrome   . Sepsis ([place]) 03/30/2018  . Sleep apnea   . Subclavian artery stenosis, right ([place])   . Syncope and collapse   . Tobacco use    a. 75+ yr hx - still smoking 1ppd, down from 2 ppd.  . Type II diabetes mellitus ([place])   . Unstable angina ([place]) 07/11/2016  . Varicose vein      Assessment: 60 yo male admitted with cellulitis. PMH of DM, CHF, COPD, CAD, and A. Fib (on Eliquis). Troponin  HS elevated at 1190 but now trending down. Pharmacy has been consulted for heparin infusion dosing and monitoring. H&H stable, platelets low at baseline but appears stable  Goal of Therapy:  Heparin level 0.3-0.7 units/ml aPTT 66-102 seconds Monitor platelets by anticoagulation protocol: Yes   Date Time aPTT HL Rate/comment 2/28 1057 51  1150 2/28 1838 78  1450  Plan:   APTT therapeutic, continue heparin infusion rate at 1450 units/hr  Will need to adjust dose based on aPTT levels until aPTT and HL  correlate.   Recheck aPTT in 6 hours  CBC daily while on heparin  Continue to monitor H&H and platelets  Darnelle Bos, PharmD 08/07/2020 7:52 PM

## 2020-08-08 LAB — HEPARIN LEVEL (UNFRACTIONATED): Heparin Unfractionated: 0.43 IU/mL (ref 0.30–0.70)

## 2020-08-08 LAB — COMPREHENSIVE METABOLIC PANEL
ALT: 9 U/L (ref 0–44)
AST: 17 U/L (ref 15–41)
Albumin: 3.4 g/dL — ABNORMAL LOW (ref 3.5–5.0)
Alkaline Phosphatase: 69 U/L (ref 38–126)
Anion gap: 9 (ref 5–15)
BUN: 15 mg/dL (ref 6–20)
CO2: 30 mmol/L (ref 22–32)
Calcium: 8.7 mg/dL — ABNORMAL LOW (ref 8.9–10.3)
Chloride: 97 mmol/L — ABNORMAL LOW (ref 98–111)
Creatinine, Ser: 1.28 mg/dL — ABNORMAL HIGH (ref 0.61–1.24)
GFR, Estimated: >60 mL/min (ref >60)
Glucose, Bld: 112 mg/dL — ABNORMAL HIGH (ref 70–99)
Potassium: 3.7 mmol/L (ref 3.5–5.1)
Sodium: 136 mmol/L (ref 135–145)
Total Bilirubin: 1.8 mg/dL — ABNORMAL HIGH (ref 0.3–1.2)
Total Protein: 6.4 g/dL — ABNORMAL LOW (ref 6.5–8.1)

## 2020-08-08 LAB — CBC
HCT: 37.4 % — ABNORMAL LOW (ref 39.0–52.0)
Hemoglobin: 12.4 g/dL — ABNORMAL LOW (ref 13.0–17.0)
MCH: 28.4 pg (ref 26.0–34.0)
MCHC: 33.2 g/dL (ref 30.0–36.0)
MCV: 85.8 fL (ref 80.0–100.0)
Platelets: 121 10*3/uL — ABNORMAL LOW (ref 150–400)
RBC: 4.36 MIL/uL (ref 4.22–5.81)
RDW: 20.5 % — ABNORMAL HIGH (ref 11.5–15.5)
WBC: 6.8 10*3/uL (ref 4.0–10.5)
nRBC: 0 % (ref 0.0–0.2)

## 2020-08-08 LAB — CREATININE, SERUM
Creatinine, Ser: 1.16 mg/dL (ref 0.61–1.24)
GFR, Estimated: >60 mL/min (ref >60)

## 2020-08-08 LAB — GLUCOSE, CAPILLARY
Glucose-Capillary: 129 mg/dL — ABNORMAL HIGH (ref 70–99)
Glucose-Capillary: 107 mg/dL — ABNORMAL HIGH (ref 70–99)
Glucose-Capillary: 171 mg/dL — ABNORMAL HIGH (ref 70–99)
Glucose-Capillary: 181 mg/dL — ABNORMAL HIGH (ref 70–99)

## 2020-08-08 LAB — BRAIN NATRIURETIC PEPTIDE: B Natriuretic Peptide: 1395.9 pg/mL — ABNORMAL HIGH (ref 0.0–100.0)

## 2020-08-08 LAB — APTT: aPTT: 93 seconds — ABNORMAL HIGH (ref 24–36)

## 2020-08-08 LAB — TROPONIN I (HIGH SENSITIVITY): Troponin I (High Sensitivity): 440 ng/L (ref <18)

## 2020-08-08 MED ORDER — POTASSIUM CHLORIDE CRYS ER 20 MEQ PO TBCR
30.0000 meq | EXTENDED_RELEASE_TABLET | Freq: Once | ORAL | Status: AC
  Administered 2020-08-08: 30 meq via ORAL
  Filled 2020-08-08: qty 1

## 2020-08-08 NOTE — Progress Notes (Addendum)
Progress Note  Patient Name: [name] [name] Date of Encounter: 08/08/2020  Primary Cardiologist: Ida Rogue, MD   Subjective   Cath films have been reviewed by interventional team with recommendation for medical management only. He is not a great candidate for PCI without some form of mechanical support and has poor targets for CABG. This was discussed with pt.  No CP or SOB.   Inpatient Medications    Scheduled Meds: . aspirin  81 mg Oral Daily  . atorvastatin  80 mg Oral Daily  . carvedilol  3.125 mg Oral BID WC  . clopidogrel  75 mg Oral Daily  . digoxin  0.125 mg Oral Daily  . DULoxetine  30 mg Oral Daily  . ezetimibe  10 mg Oral Daily  . furosemide  40 mg Intravenous BID  . gabapentin  800 mg Oral QID  . insulin aspart  0-15 Units Subcutaneous TID WC  . isosorbide mononitrate  30 mg Oral Daily  . mometasone-formoterol  2 puff Inhalation BID  . pantoprazole  40 mg Oral Daily  . potassium chloride  10 mEq Oral BID  . sodium chloride flush  3 mL Intravenous Q12H  . spironolactone  12.5 mg Oral Daily  . tamsulosin  0.4 mg Oral QPC supper  . traZODone  150 mg Oral QHS  . Vericiguat  5 mg Oral Daily   Continuous Infusions: . heparin 1,450 Units/hr (08/08/20 0537)  . piperacillin-tazobactam (ZOSYN)  IV 3.375 g (08/08/20 0536)  . vancomycin 1,500 mg (08/08/20 0207)   PRN Meds: acetaminophen **OR** [DISCONTINUED] acetaminophen, albuterol, nitroGLYCERIN, oxyCODONE, polyethylene glycol   Vital Signs    Vitals:   08/07/20 2133 08/07/20 2349 08/08/20 0601 08/08/20 0722  BP: (!) 118/54 92/65 (!) 114/91 112/64  Pulse: (!) 47 79 (!) 58 (!) 57  Resp: '18 18 20 19  '$ Temp: 98.1 F (36.7 C) 98.4 F (36.9 C) 97.9 F (36.6 C) 98.1 F (36.7 C)  TempSrc: Oral  Oral Oral  SpO2: 99% 100% 98% 95%  Weight:   85.4 kg   Height:        Intake/Output Summary (Last 24 hours) at 08/08/2020 0758 Last data filed at 08/07/2020 1900 Gross per 24 hour  Intake 950 ml  Output 1900 ml   Net -950 ml   Last 3 Weights 08/08/2020 08/07/2020 08/07/2020  Weight (lbs) 188 lb 3.2 oz 184 lb 11.9 oz 184 lb 11.9 oz  Weight (kg) 85.367 kg 83.8 kg 83.8 kg  Some encounter information is confidential and restricted. Go to Review Flowsheets activity to see all data.      Telemetry    Atrial fibrillation with controlled ventricular rate 50s to 60s- Personally Reviewed  ECG    No new tracings- Personally Reviewed  Physical Exam   GEN: No acute distress.   Neck:  JVD difficult to assess due to position of patient.   Cardiac: IRIR, no murmurs, rubs, or gallops.  Respiratory: Bilaterally reduced breath sounds. GI: Soft, nontender, non-distended  MS:  S/p right BKA.  Left lower extremity with erythema and cyanosis noted, as well as cracked toe. Ankle edema noted.  Neuro:  Nonfocal  Psych: Normal affect   Labs    High Sensitivity Troponin:   Recent Labs  Lab 08/06/20 0205 08/06/20 0415 08/06/20 0715 08/07/20 1057 08/08/20 0448  TROPONINIHS 863* 1,190* 1,186* 504* 440*      Chemistry Recent Labs  Lab 08/06/20 0403 08/06/20 0715 08/07/20 0406 08/08/20 0448  NA 135  --  139 136  K 4.2  --  3.8 3.7  CL 96*  --  100 97*  CO2 32  --  29 30  GLUCOSE 97  --  95 112*  BUN 10  --  12 15  CREATININE 1.09  --  1.26* 1.28*  CALCIUM 8.8*  --  9.0 8.7*  PROT 6.2*  --  6.1* 6.4*  ALBUMIN 3.1*  --  3.2* 3.4*  AST 33  --  18 17  ALT 9  --  8 9  ALKPHOS 78  --  74 69  BILITOT 1.6* 1.6* 2.0* 1.8*  GFRNONAA >60  --  >60 >60  ANIONGAP 7  --  10 9     Hematology Recent Labs  Lab 08/06/20 0403 08/07/20 0406 08/08/20 0448  WBC 6.4 7.1 6.8  RBC 4.06* 4.29 4.36  HGB 11.4* 12.0* 12.4*  HCT 35.1* 37.2* 37.4*  MCV 86.5 86.7 85.8  MCH 28.1 28.0 28.4  MCHC 32.5 32.3 33.2  RDW 21.0* 21.1* 20.5*  PLT 132* 123* 121*    BNP Recent Labs  Lab 08/05/20 1844 08/07/20 1057  BNP 1,069.1* 1,042.9*     DDimer No results for input(s): DDIMER in the last 168 hours.   Radiology     No results found.  Cardiac Studies   Echo 06/17/2020 1. Left ventricular ejection fraction, by estimation, is <20%. Left  ventricular ejection fraction by PLAX is 14 %. The left ventricle has  severely decreased function. The left ventricle demonstrates global  hypokinesis. The left ventricular internal  cavity size was severely dilated. Left ventricular diastolic parameters  are indeterminate.  2. Right ventricular systolic function is severely reduced. The right  ventricular size is moderately enlarged. There is severely elevated  pulmonary artery systolic pressure. The estimated right ventricular  systolic pressure is [id number] mmHg.  3. Left atrial size was severely dilated.  4. Right atrial size was severely dilated.  5. Tricuspid valve regurgitation is mild to moderate.  6. The inferior vena cava is dilated in size with <50% respiratory  variability, suggesting right atrial pressure of 15 mmHg.   Cardiac catheterization 01/12/2020 Conclusion  Prox RCA-1 lesion is 80% stenosed.  Prox RCA-2 lesion is 20% stenosed.  Prox RCA to Mid RCA lesion is 80% stenosed.  Prox LAD lesion is 20% stenosed.  1st Diag lesion is 80% stenosed.  Mid LAD lesion is 20% stenosed.  Dist LAD lesion is 60% stenosed.  Prox Cx to Mid Cx lesion is 90% stenosed.  LPAV lesion is 95% stenosed.  1. Significant underlying three-vessel coronary artery disease. Patent proximal LAD stent with mild in-stent restenosis. Moderate diffuse disease in the distal LAD. Significant stenosis in the proximal left circumflex at the origin of the posterior AV groove artery which is also heavily diseased at the ostium. This is a bifurcation lesion and heavily calcified. RCA stent is patent. However, there is significant proximal disease in the whole mid to distal segment is diffusely diseased and small caliber. 2. Left ventricular angiography was not performed. EF was severe reduced by echo. 3. Moderately elevated  left ventricular end-diastolic pressure at 28 mmHg  Recommendations: The patient's cardiomyopathy seems to be out of proportion to his coronary artery disease as the LAD itself does not seem to have obstructive disease at the present time. The RCA is diffusely diseased and too small to stent. The only target for revascularization would be the left circumflex but it is a calcified bifurcation lesion and likely requires atherectomy.  In order to do this safely, the patient has to be optimized from a heart failure standpoint as he appears to be volume overloaded. Recommend intravenous diuresis. If limited by hypotension, we might need to consider inotropic support and small dose norepinephrine drip initially. I added small dose digoxin. Treat underlying atrial fibrillation. Resume heparin drip 8 hours after sheath pull.   Coronary Diagrams   Diagnostic Dominance: Co-dominant    LE arterial study - 09/08/19 Summary:  Left: Patent Left LE arterial system from EIA to distal PTA/ATA. New SFA  to TP trunk stent is patent with no evidence of stenosis. Spot check of  lower extremity venous system shows no evidence of DVT. Lymph nodes in  groin show multiple enlarged nodes.   ABIs- 08/11/19 Summary:  Left: Resting left ankle-brachial index indicates mild left lower  extremity arterial disease. The left toe-brachial index is abnormal.   08/2017 [facility]. The left ventricle is moderately dilated in cavity size with normal wall thickness. Global systolic function is severely reduced with an LV ejection fraction calculated at 19%. There is severe hypokinesis globally. 2. The right ventricle is moderately dilated in cavity size with normal wall thickness. Global RV systolic function is severely reduced with an RVEF calculated at 20%.  3. Both atria are moderately enlarged in size. 4. The aortic valve is trileaflet in morphology. There is no significant aortic valve  stenosis or regurgitation.  5. There is trivial mitral regurgitation. There is mild-moderate tricuspid regurgitation. 6. Delayed enhancement imaging is mildly abnormal. There is a small thin layer of subendocardial infarction in the basal lateral wall. There is significant viability in all coronary territories. 7. Adenosine stress perfusion imaging is abnormal with stress perfusion defects indicating inducible myocardial ischemia in: a) the entire inferior and inferoseptal walls, consistent with the RCA perfusion territory. b) the basal-to-mid lateral wall, suggestive of peri-infarct ischemia in the LCx perfusion territory. c) the distal septal wall, consistent with the distal LAD territory. 8. No intra-cardiac thrombus visualized. 9. There is a small circumferential pericardial effusion. There are small bilateral pleural effusions.   Patient Profile     60 y.o. male multivessel with history of CAD s/p PCI and severe 3V dz felt not to be a surgical candidate or great candidate for PCI with recommendation for medical management only, chronic systolic CHF, pulmonary hypertension, COPD, ongoing tobacco use (2 packs daily), hypertension, hyperlipidemia, obesity, OSA noncompliant with CPAP, DM2 with neuropathy and lower extremity ulceration, PAD s/p prior SFA stenting 03/2013 s/p right BKA, left lower extremity cellulitis on antibiotics, carotid artery disease, and who is being followed this admission for NSTEMI.  Assessment & Plan    NSTEMI  Known history of severe 3v CAD / ICM --No CP. Admitted with cellulitis and reported an episode of CP. HS Tn peaked at 1,190.0. Supply demand ischemia considered but cannot rule out coronary insufficiency given 01/2020 LHC with severe multivessel CAD. Interventional team ruled pt not a surgical candidate for CABG due to poor targets, as well as high risk for PCI.   Plan for medical management only.    Continue IV heparin. Transition to Eliquis once no  surgical procedures. Continue Plavix, beta-blocker, Imdur, and nitro PRN for CP. Discontinue ASA with restart of Eliquis to reduce risk of bleeding. Statin and Zetia should be continued for secondary prevention of CAD and PAD.     Acute on chronic systolic congestive heart failure Pulmonary hypertension, ICM --Volume overloaded at presentation. EF <20%. He has been  continued on IV diuresis. Overnight renal function continues to bump.  BNP 1,042.9.  Continue IV lasix '40mg'$  BID.  Consider slowed IV diuresis and transition to oral diuresis with ongoing bump in Cr. Discontinue spironolactone for now given Cr bump. Continue to monitor very closely.    Monitor I/Os, daily wt, daily BMET.  Net -1.5L yesterday. Net -3.0L for admission.  Wt 83.8  85.4kg. Suspect not accurate.  Cr 1.09  1.26  1.28. BUN 10  12  15.  Replete K+, goal 4.0.  Ordered KCL tab 59mq x1.  No ACE/ARB/Entresto/Spironolactone due to Cr bump.   Continue Coreg, Imdur, digoxin, verquvo    Permanent Afib --No sx. Rate controlled.  Continue rate control with Coreg    Continue digoxin - monitor level.    2/27 dig level 0.9.  Replete potassium.   Ordered KCL tab.  Continue IV heparin.   CHA2DS2VASc score of at least  4 (CHF, HTN, DM2, vascular).   Restart Eliquis once discontinue IV heparin.   Discontinue ASA with restart of Eliquis to reduce risk of bleeding.  Hypokalemia --Continue to replete with goal 4.0.    HLD --Continue statin and Zetia  HTN --Continue home medications  DM2 --SSI, Per IM.  Left lower extremity cellulitis PAD s/p R BKA for diabetic ulcer --Recurrent left lower extremity cellulitis. S/p R BKDA for DM2 ulcer and MRSA infection of RLE. Continue antibiotics.  Anemia --Daily CBC.  COPD Tobacco use --Smoking cessation recommended.   For questions or updates, please contact [place] consult [email or web address] for contact info under        Signed, JArvil Chaco PA-C  08/08/2020, 7:58 AM

## 2020-08-08 NOTE — Progress Notes (Signed)
[name] [name]  [id number] DOB: 06-06-1961 DOA: 08/05/2020 PCP: [name], MD    Brief Narrative:  60 year old with a history of CHF, CAD, HLD, PAD, DM 2, COPD, right BKA, cervical spinal stenosis, chronic lymphedema, venous insufficiency, HTN, atrial fibrillation, and obesity who presented to the ER with a left foot wound.  Patient reported dryness and cracking of his left foot which had worsened over the past couple of weeks.  He began to notice a foul odor coming from the foot the day of his admission when he found significant drainage between his first and second toes.  Significant Events:  2/26 admit via ER  Antimicrobials:  Vancomycin 2/26 > Zosyn 2/26 >  DVT prophylaxis: Eliquis > IV heparin   Subjective: Afebrile.  Vital signs stable.  Saturation 95% on room air.  Complains of some pain at the angle of his right jaw but none on his left.  Denies headache.  Denies chest pain.  Reports ongoing but improved pain in his left foot.  Assessment & Plan:  Diabetic foot infection No evidence on plain film x-ray to suggest osteomyelitis - ABIs 07/17/20 0.73 - had L LE angiogram 07/17/20 demonstrated moderate disease of the common femoral artery and the first 4 to 5 cm of the superficial femoral artery in the 60 to 70% range. This tracked down to just above the previously placed stent. All of the stents in the SFA and popliteal artery are widely patent with less than 10% stenosis. There was then two-vessel runoff distally with both posterior tibial and peroneal arteries which were patent without significant stenosis - cont broad spectrum abx coverage - may need to consider MRI of foot if pain persists - clinically appaers to be improving on broad spectrum emperic abx coverage   NSTEMI / Elevated troponin - Complex hx of CAD NSTEMI in setting of acute distress of cellulitis - has had some intermittent cp on hx - hx of LAD and RCA interventions 2013 and 2014 - 2018 cath noted severe  multivessel CAD w/ surgical referral but pt refused CABG - last cath Aug 2021 noted patent proximal LAD stent, moderate distal LAD disease, significant stenosis in the proximal left circumflex, bifurcation lesions, patent stent to the RCA, significant proximal disease in the mid to distal segment - Cards following - cont medical care to include heparin gtt under direction of Cardiology   Hypotension  Resolved   Chronic Severe Ischemic Systolic CHF and Severe R Heart Failure  EF < 20% per TTE Jan 2022 - care per Cards - diuresis underway - net negative ~2.3L thus far - no gross severe overload at present -watch renal function with ongoing diuresis  DM 2 CBG controlled   PAD s/p L SFA stenting and R BKA No clinical findings of acute ischemia left lower extremity  HLD Cont medical tx  COPD Ongoing tobacco abuse -counseled on absolute need to abstain  Chronic atrial fibrillation resume usual Eliquis dosing once heparin gtt discontinued   Chronic neuropathic pain  Hx of noncompliance Often leaves AMA   Code Status: FULL CODE Family Communication:  Status is: Inpatient  Remains inpatient appropriate because:Inpatient level of care appropriate due to severity of illness   Dispo: The patient is from: Home              Anticipated d/c is to: Home              Patient currently is not medically stable to d/c.   Difficult  to place patient No   Consultants:  Cardiology   Objective: Blood pressure 112/64, pulse (!) 57, temperature 98.1 F (36.7 C), temperature source Oral, resp. rate 19, height '5\' 7"'$  (1.702 m), weight 85.4 kg, SpO2 95 %.  Intake/Output Summary (Last 24 hours) at 08/08/2020 1012 Last data filed at 08/08/2020 1000 Gross per 24 hour  Intake 1190 ml  Output 2200 ml  Net -1010 ml   Filed Weights   08/07/20 0500 08/07/20 0523 08/08/20 0601  Weight: 83.8 kg 83.8 kg 85.4 kg    Examination: General: No acute respiratory distress Lungs: CTA B - no wheezing   Cardiovascular: RRR - no M or rub  Abdomen: NT/ND, soft, BS positive, no rebound Extremities: trace L LE edema - wound dry at this time - L LE no longer warm to touch and erythema resolved -significant chronic skin changes across foot with multiple calluses, dry skin, and deep cracks   CBC: Recent Labs  Lab 08/05/20 1841 08/06/20 0403 08/07/20 0406 08/08/20 0448  WBC 7.5 6.4 7.1 6.8  NEUTROABS 6.1  --   --   --   HGB 12.4* 11.4* 12.0* 12.4*  HCT 38.7* 35.1* 37.2* 37.4*  MCV 86.4 86.5 86.7 85.8  PLT 143* 132* 123* [id number]*   Basic Metabolic Panel: Recent Labs  Lab 08/05/20 1841 08/06/20 0403 08/07/20 0406 08/08/20 0448  NA 135 135 139 136  K 4.0 4.2 3.8 3.7  CL 92* 96* 100 97*  CO2 31 32 29 30  GLUCOSE 185* 97 95 112*  BUN '10 10 12 15  '$ CREATININE 1.19 1.09 1.26* 1.28*  CALCIUM 9.1 8.8* 9.0 8.7*  MG 1.9  --  2.1  --    GFR: Estimated Creatinine Clearance: 66.5 mL/min (A) (by C-G formula based on SCr of 1.28 mg/dL (H)).  Liver Function Tests: Recent Labs  Lab 08/05/20 1841 08/06/20 0403 08/06/20 0715 08/07/20 0406 08/08/20 0448  AST 17 33  --  18 17  ALT 9 9  --  8 9  ALKPHOS 92 78  --  74 69  BILITOT 1.6* 1.6* 1.6* 2.0* 1.8*  PROT 6.7 6.2*  --  6.1* 6.4*  ALBUMIN 3.6 3.1*  --  3.2* 3.4*    Coagulation Profile: Recent Labs  Lab 08/06/20 1433  INR 1.4*    HbA1C: Hemoglobin A1C  Date/Time Value Ref Range Status  04/04/2013 04:16 AM 9.8 (H) 4.2 - 6.3 % Final    Comment:    The American Diabetes Association recommends that a primary goal of therapy should be <7% and that physicians should reevaluate the treatment regimen in patients with HbA1c values consistently >8%.    Hgb A1c MFr Bld  Date/Time Value Ref Range Status  08/06/2020 04:03 AM 6.5 (H) 4.8 - 5.6 % Final    Comment:    (NOTE) Pre diabetes:          5.7%-6.4%  Diabetes:              >6.4%  Glycemic control for   <7.0% adults with diabetes   06/17/2020 06:00 AM 6.1 (H) 4.8 - 5.6 %  Final    Comment:    (NOTE) Pre diabetes:          5.7%-6.4%  Diabetes:              >6.4%  Glycemic control for   <7.0% adults with diabetes     CBG: Recent Labs  Lab 08/07/20 0758 08/07/20 1138 08/07/20 1639 08/07/20 2123 08/08/20  0721  GLUCAP 106* 180* 144* 163* 129*     Scheduled Meds: . aspirin  81 mg Oral Daily  . atorvastatin  80 mg Oral Daily  . carvedilol  3.125 mg Oral BID WC  . clopidogrel  75 mg Oral Daily  . digoxin  0.125 mg Oral Daily  . DULoxetine  30 mg Oral Daily  . ezetimibe  10 mg Oral Daily  . furosemide  40 mg Intravenous BID  . gabapentin  800 mg Oral QID  . insulin aspart  0-15 Units Subcutaneous TID WC  . isosorbide mononitrate  30 mg Oral Daily  . mometasone-formoterol  2 puff Inhalation BID  . pantoprazole  40 mg Oral Daily  . potassium chloride  10 mEq Oral BID  . sodium chloride flush  3 mL Intravenous Q12H  . tamsulosin  0.4 mg Oral QPC supper  . traZODone  150 mg Oral QHS  . Vericiguat  5 mg Oral Daily   Continuous Infusions: . heparin 1,450 Units/hr (08/08/20 0537)  . piperacillin-tazobactam (ZOSYN)  IV 3.375 g (08/08/20 0536)  . vancomycin 1,500 mg (08/08/20 0207)     LOS: 2 days   Cherene Altes, MD [facility] Office  [phone] Pager - Text Page per Amion  If 7PM-7AM, please contact night-coverage per Amion 08/08/2020, 10:12 AM

## 2020-08-08 NOTE — Progress Notes (Addendum)
ANTICOAGULATION CONSULT NOTE  Pharmacy Consult for Heparin infusion  Indication: chest pain/ACS  Patient Measurements: Height: '5\' 7"'$  (170.2 cm) Weight: 83.8 kg (184 lb 11.9 oz) IBW/kg (Calculated) : 66.1  Vital Signs: Temp: 98.4 F (36.9 C) (02/28 2349) Temp Source: Oral (02/28 2133) BP: 92/65 (02/28 2349) Pulse Rate: 79 (02/28 2349)  Labs: Recent Labs    08/05/20 1841 08/06/20 0043 08/06/20 0403 08/06/20 0415 08/06/20 0715 08/06/20 1433 08/06/20 2139 08/07/20 0406 08/07/20 1057 08/07/20 1838 08/08/20 0041  HGB 12.4*  --  11.4*  --   --   --   --  12.0*  --   --   --   HCT 38.7*  --  35.1*  --   --   --   --  37.2*  --   --   --   PLT 143*  --  132*  --   --   --   --  123*  --   --   --   APTT  --   --   --   --   --  37*   < > 66* 51* 78* 93*  LABPROT  --   --   --   --   --  16.5*  --   --   --   --   --   INR  --   --   --   --   --  1.4*  --   --   --   --   --   HEPARINUNFRC  --   --   --   --   --  >2.20*  --  1.47*  --   --   --   CREATININE 1.19  --  1.09  --   --   --   --  1.26*  --   --   --   TROPONINIHS  --    < >  --  1,190* 1,186*  --   --   --  504*  --   --    < > = values in this interval not displayed.    Estimated Creatinine Clearance: 67 mL/min (A) (by C-G formula based on SCr of 1.26 mg/dL (H)).   Medical History: Past Medical History:  Diagnosis Date  . Acute on chronic heart failure ([place])   . AKI (acute kidney injury) ([place]) 01/03/2018  . Angina at rest [facility]) 08/13/2017  . Arthritis   . Asthma   . Atherosclerosis   . BPH (benign prostatic hyperplasia)   . Carotid arterial disease ([place])   . Cellulitis of foot, left 09/27/2019  . Charcot's joint of foot, right   . Chronic combined systolic (congestive) and diastolic (congestive) heart failure ([place])    a. 02/2013 EF 50% by LV gram; b. 04/2017 Echo: EF 25-30%. diff HK. Gr2 DD. Mod dil LA/RV. PASP 10mHg.  .Marland KitchenChronic foot pain (Secondary Area of Pain) (Right) 01/29/2018  . COPD (chronic  obstructive pulmonary disease) ([place]   . COPD with acute exacerbation ([place] 01/30/2020  . Coronary artery disease    a. 2013 S/P PCI of LAD ([facility];  b. 03/2013 PCI: RCA 90p ( 2.5 x 23 mm DES); c. 02/2014 Cath: patent RCA stent->Med Rx; d. 04/2017 Cath: LM nl, LAd 30ost/p, 664m, D1 80ost, LCX 90p/m, OM1 85, RCA 70/20p, 8067m0d->Referred for CT Surg-felt to be poor candidate.  . Coronary artery disease of native artery of native heart with stable angina pectoris ([place]/08/2017  Status post LAD PCI in 2013 at [facility]. Presented in October of 2014 to [facility] with unstable angina. Nuclear stress test showed inferior wall defect. Cardiac catheterization showed patent LAD stent with 90% stenosis in the proximal RCA. Ejection fraction was 50%. Status post PCI of the RCA with a 2.5 x 23 mm drug-eluting stent  . Diabetic neuropathy ([place])   . Diabetic ulcer of right foot ([place])   . Hernia   . HFrEF (heart failure with reduced ejection fraction) ([place]) 01/30/2020  . Hyperlipidemia   . Hypertension   . Ischemic cardiomyopathy    a. 04/2017 Echo: EF 25-30%; b. 08/2017 Cardiac MRI ([facility]): EF 19%, sev glob HK. RVEF 20%, mod BAE, triv MR, mild-mod TR. Basal lateral subendocardial infarct (viable), inf/infsept, dist septal ischemia, basal to mid lat peri-infarct ischemia.  . Morbid obesity ([place])   . Neuropathy   . PAD (peripheral artery disease) ([place])    a. Followed by Dr. Lucky Cowboy; b. 08/2010 Periph Angio: RSFA 70-80p (6X60 self-expanding stent); c. 09/2010 Periph Angio: L SFA 100p (7x unknown length self-expanding stent); d. 06/2015 Periph Angio: R SFA short segment occlusion (6x12 self-expanding stent).  . Restless leg syndrome   . Sepsis ([place]) 03/30/2018  . Sleep apnea   . Subclavian artery stenosis, right ([place])   . Syncope and collapse   . Tobacco use    a. 75+ yr hx - still smoking 1ppd, down from 2 ppd.  . Type II diabetes mellitus ([place])   . Unstable angina ([place]) 07/11/2016  . Varicose vein      Assessment: 60 yo  male admitted with cellulitis. PMH of DM, CHF, COPD, CAD, and A. Fib (on Eliquis). Troponin HS elevated at 1190 but now trending down. Pharmacy has been consulted for heparin infusion dosing and monitoring. H&H stable, platelets low at baseline but appears stable  Goal of Therapy:  Heparin level 0.3-0.7 units/ml aPTT 66-102 seconds Monitor platelets by anticoagulation protocol: Yes   Date Time aPTT HL Rate/comment 2/28 1057 51  1150 2/28 1838 78  1450 3/01     0041    92                    1450   Plan:  3/1:  APTT @ 0041 = 92, therapeutic X 2   3/1:  HL @ 0448 = 0.43 HL and aPTT are both therapeutic so will use HL to dose heparin. Will recheck HL on 3/2 with AM labs.   Lidia Clavijo D, PharmD 08/08/2020 1:30 AM

## 2020-08-08 NOTE — Treatment Plan (Signed)
Pt had a 5 beat run of vtach at this time, MD Thereasa Solo made aware. Pt not in distress at this time, converted back to afib.

## 2020-08-08 NOTE — Plan of Care (Signed)

## 2020-08-09 ENCOUNTER — Inpatient Hospital Stay: Payer: Medicare Other

## 2020-08-09 DIAGNOSIS — I5023 Acute on chronic systolic (congestive) heart failure: Secondary | ICD-10-CM

## 2020-08-09 DIAGNOSIS — I5020 Unspecified systolic (congestive) heart failure: Secondary | ICD-10-CM

## 2020-08-09 DIAGNOSIS — I2510 Atherosclerotic heart disease of native coronary artery without angina pectoris: Secondary | ICD-10-CM

## 2020-08-09 DIAGNOSIS — I482 Chronic atrial fibrillation, unspecified: Secondary | ICD-10-CM

## 2020-08-09 LAB — COMPREHENSIVE METABOLIC PANEL
ALT: 9 U/L (ref 0–44)
AST: 16 U/L (ref 15–41)
Albumin: 3.4 g/dL — ABNORMAL LOW (ref 3.5–5.0)
Alkaline Phosphatase: 69 U/L (ref 38–126)
Anion gap: 9 (ref 5–15)
BUN: 17 mg/dL (ref 6–20)
CO2: 30 mmol/L (ref 22–32)
Calcium: 9.3 mg/dL (ref 8.9–10.3)
Chloride: 95 mmol/L — ABNORMAL LOW (ref 98–111)
Creatinine, Ser: 1.23 mg/dL (ref 0.61–1.24)
GFR, Estimated: >60 mL/min (ref >60)
Glucose, Bld: 110 mg/dL — ABNORMAL HIGH (ref 70–99)
Potassium: 4.1 mmol/L (ref 3.5–5.1)
Sodium: 134 mmol/L — ABNORMAL LOW (ref 135–145)
Total Bilirubin: 1.9 mg/dL — ABNORMAL HIGH (ref 0.3–1.2)
Total Protein: 6.3 g/dL — ABNORMAL LOW (ref 6.5–8.1)

## 2020-08-09 LAB — CBC
HCT: 37.5 % — ABNORMAL LOW (ref 39.0–52.0)
Hemoglobin: 12.6 g/dL — ABNORMAL LOW (ref 13.0–17.0)
MCH: 28.4 pg (ref 26.0–34.0)
MCHC: 33.6 g/dL (ref 30.0–36.0)
MCV: 84.7 fL (ref 80.0–100.0)
Platelets: 124 10*3/uL — ABNORMAL LOW (ref 150–400)
RBC: 4.43 MIL/uL (ref 4.22–5.81)
RDW: 19.9 % — ABNORMAL HIGH (ref 11.5–15.5)
WBC: 7.1 10*3/uL (ref 4.0–10.5)
nRBC: 0 % (ref 0.0–0.2)

## 2020-08-09 LAB — MAGNESIUM: Magnesium: 2.1 mg/dL (ref 1.7–2.4)

## 2020-08-09 LAB — GLUCOSE, CAPILLARY
Glucose-Capillary: 114 mg/dL — ABNORMAL HIGH (ref 70–99)
Glucose-Capillary: 164 mg/dL — ABNORMAL HIGH (ref 70–99)
Glucose-Capillary: 167 mg/dL — ABNORMAL HIGH (ref 70–99)
Glucose-Capillary: 161 mg/dL — ABNORMAL HIGH (ref 70–99)

## 2020-08-09 LAB — HEPARIN LEVEL (UNFRACTIONATED): Heparin Unfractionated: 0.27 IU/mL — ABNORMAL LOW (ref 0.30–0.70)

## 2020-08-09 MED ORDER — LOSARTAN POTASSIUM 25 MG PO TABS
12.5000 mg | ORAL_TABLET | Freq: Every day | ORAL | Status: DC
  Administered 2020-08-09 – 2020-08-12 (×4): 12.5 mg via ORAL
  Filled 2020-08-09 (×4): qty 1

## 2020-08-09 MED ORDER — APIXABAN 5 MG PO TABS
5.0000 mg | ORAL_TABLET | Freq: Two times a day (BID) | ORAL | Status: DC
  Administered 2020-08-09 – 2020-08-10 (×3): 5 mg via ORAL
  Filled 2020-08-09 (×3): qty 1

## 2020-08-09 MED ORDER — OXYCODONE HCL 5 MG PO TABS
10.0000 mg | ORAL_TABLET | Freq: Four times a day (QID) | ORAL | Status: DC | PRN
  Administered 2020-08-09 – 2020-08-12 (×9): 10 mg via ORAL
  Filled 2020-08-09 (×9): qty 2

## 2020-08-09 MED ORDER — FUROSEMIDE 10 MG/ML IJ SOLN
40.0000 mg | Freq: Every day | INTRAMUSCULAR | Status: DC
  Administered 2020-08-10 – 2020-08-11 (×2): 40 mg via INTRAVENOUS
  Filled 2020-08-09 (×2): qty 4

## 2020-08-09 MED ORDER — SODIUM CHLORIDE 0.9 % IV SOLN
INTRAVENOUS | Status: DC | PRN
  Administered 2020-08-09 – 2020-08-11 (×3): 250 mL via INTRAVENOUS

## 2020-08-09 MED ORDER — GADOBUTROL 1 MMOL/ML IV SOLN
8.0000 mL | Freq: Once | INTRAVENOUS | Status: AC | PRN
  Administered 2020-08-09: 8 mL via INTRAVENOUS

## 2020-08-09 NOTE — Plan of Care (Signed)
Heparin gtt increased to 1600 units/hr per Pharmacist driven adjustment. Prn pain meds adm for left leg pain, effective.  Problem: Clinical Measurements: Goal: Ability to avoid or minimize complications of infection will improve Outcome: Progressing   Problem: Skin Integrity: Goal: Skin integrity will improve Outcome: Progressing   Problem: Education: Goal: Knowledge of General Education information will improve Description: Including pain rating scale, medication(s)/side effects and non-pharmacologic comfort measures Outcome: Progressing   Problem: Health Behavior/Discharge Planning: Goal: Ability to manage health-related needs will improve Outcome: Progressing   Problem: Clinical Measurements: Goal: Ability to maintain clinical measurements within normal limits will improve Outcome: Progressing Goal: Will remain free from infection Outcome: Progressing Goal: Diagnostic test results will improve Outcome: Progressing Goal: Respiratory complications will improve Outcome: Progressing Goal: Cardiovascular complication will be avoided Outcome: Progressing   Problem: Activity: Goal: Risk for activity intolerance will decrease Outcome: Progressing   Problem: Nutrition: Goal: Adequate nutrition will be maintained Outcome: Progressing   Problem: Coping: Goal: Level of anxiety will decrease Outcome: Progressing   Problem: Elimination: Goal: Will not experience complications related to bowel motility Outcome: Progressing Goal: Will not experience complications related to urinary retention Outcome: Progressing   Problem: Pain Managment: Goal: General experience of comfort will improve Outcome: Progressing   Problem: Safety: Goal: Ability to remain free from injury will improve Outcome: Progressing   Problem: Skin Integrity: Goal: Risk for impaired skin integrity will decrease Outcome: Progressing

## 2020-08-09 NOTE — Evaluation (Signed)
Physical Therapy Evaluation Patient Details Name: [name] [name] MRN: [id number] DOB: 17-May-1961 Today's Date: 08/09/2020   History of Present Illness  Pt is a 60 y/o M with PMH: Arthritis, Asthma, Atherosclerosis, BPH, Charcot's joint of foot-right, Chronic combined systolic and diastolic heart failure, COPD, CAD/PCI, tobacco abuse, PAD s/p L SFA stenting, s/p R BKA, afib on Eliquis, OSA noncompliant with CPAP, and h/o leaving AMA/medical noncompliance. Pt admitted for NSTEMI, LE cellulitis. Per cardiology pt has declined CABG in the past, placed on heparin drip and to be managed medically.    Clinical Impression  Patient alert, agreeable to PT, oriented x4. Reported LLE pain, about 7/10. Per pt at baseline he is independent/modI for ADLs/IADLs, uses his power wheelchair for long distances, "furniture walks" in the home, drives, reported 1 fall.  The patient demonstrated bed mobility with handheld assist, provided but not required. Good sitting balance noted, and pt able to don shoe as well as prosthetic independently. Fair-good standing balance noted, pt utilized IV pole for UE support while ambulating in room ~86f, supervision/CGA. Pt able to stand and look out the window with supervision as well during session. Overall the patient demonstrated mild deficits in activity tolerance/endurance and balance, though this seems to be near baseline for the patient. Recommendation is outpatient PT to maximize pt safety, gait, and endurance, pt also reported he has not had therapy before, not even for his amputation.     Follow Up Recommendations Outpatient PT    Equipment Recommendations  None recommended by PT    Recommendations for Other Services       Precautions / Restrictions Precautions Precautions: Fall Restrictions Weight Bearing Restrictions: No Other Position/Activity Restrictions: R BKA      Mobility  Bed Mobility Overal bed mobility: Needs Assistance Bed Mobility: Supine to Sit      Supine to sit: Min guard     General bed mobility comments: pt preferred handheld assist, but not required    Transfers Overall transfer level: Modified independent Equipment used:  (IV pole)                Ambulation/Gait   Gait Distance (Feet): 20 Feet Assistive device: IV Pole       General Gait Details: pt with decreased gait velocity, use of IV pole steadying,  stated he normally furniture walks or uses his power chair. No LOB noted  Stairs            Wheelchair Mobility    Modified Rankin (Stroke Patients Only)       Balance Overall balance assessment: Mild deficits observed, not formally tested                                           Pertinent Vitals/Pain Pain Assessment: 0-10 Pain Score: 7  Pain Location: L leg (reported unable to feel his L foot) Pain Descriptors / Indicators: Burning;Aching Pain Intervention(s): Limited activity within patient's tolerance;Monitored during session;Repositioned    Home Living Family/patient expects to be discharged to:: Private residence Living Arrangements: Spouse/significant other Available Help at Discharge: Family Type of Home: House Home Access: Ramped entrance     Home Layout: One level Home Equipment: WEnvironmental consultant- 2 wheels;Cane - single point;Wheelchair - mBrewing technologistToilet riser;Wheelchair - power;Walker - 4 wheels;Bedside commode;Crutches (knee scooter)      Prior Function Level of Independence: Independent with assistive device(s)  Comments: power WC as needed in home/community. Drives. Reported independence/modI for ADLs, drives. 1 fall per pt in the last 6 months where he got tripped up by his feet  getting tangled.     Hand Dominance   Dominant Hand: Right    Extremity/Trunk Assessment   Upper Extremity Assessment Upper Extremity Assessment: Overall WFL for tasks assessed    Lower Extremity Assessment Lower Extremity Assessment: Overall WFL for  tasks assessed    Cervical / Trunk Assessment Cervical / Trunk Assessment: Normal  Communication   Communication: No difficulties  Cognition Arousal/Alertness: Awake/alert Behavior During Therapy: WFL for tasks assessed/performed Overall Cognitive Status: Within Functional Limits for tasks assessed                                        General Comments      Exercises     Assessment/Plan    PT Assessment Patient needs continued PT services  PT Problem List Decreased strength;Decreased mobility;Decreased activity tolerance;Cardiopulmonary status limiting activity       PT Treatment Interventions DME instruction;Balance training;Gait training;Neuromuscular re-education;Functional mobility training;Patient/family education;Therapeutic activities;Therapeutic exercise    PT Goals (Current goals can be found in the Care Plan section)  Acute Rehab PT Goals Patient Stated Goal: to go home PT Goal Formulation: With patient Time For Goal Achievement: 08/23/20 Potential to Achieve Goals: Good    Frequency Min 2X/week   Barriers to discharge        Co-evaluation               AM-PAC PT "6 Clicks" Mobility  Outcome Measure Help needed turning from your back to your side while in a flat bed without using bedrails?: None Help needed moving from lying on your back to sitting on the side of a flat bed without using bedrails?: None Help needed moving to and from a bed to a chair (including a wheelchair)?: None Help needed standing up from a chair using your arms (e.g., wheelchair or bedside chair)?: None Help needed to walk in hospital room?: None Help needed climbing 3-5 steps with a railing? : A Little 6 Click Score: 23    End of Session Equipment Utilized During Treatment: Gait belt Activity Tolerance: Patient tolerated treatment well Patient left: in chair;with call bell/phone within reach;with chair alarm set Nurse Communication: Mobility status PT  Visit Diagnosis: Muscle weakness (generalized) (M62.81);Difficulty in walking, not elsewhere classified (R26.2)    Time: 1413-1440 PT Time Calculation (min) (ACUTE ONLY): 27 min   Charges:   PT Evaluation $PT Eval Low Complexity: 1 Low PT Treatments $Therapeutic Activity: 8-22 mins       Lieutenant Diego PT, DPT 3:04 PM,08/09/20

## 2020-08-09 NOTE — Progress Notes (Signed)
   08/09/20 0840  Vitals  BP (!) 135/59  MAP (mmHg) 80  BP Location Right Arm  BP Method Automatic  Patient Position (if appropriate) Lying  Pulse Rate 66  Pulse Rate Source Monitor  Resp 16  Oxygen Therapy  SpO2 92 %  O2 Device Room Air  Pt had 7 beat run of vtach at this time, MD Weiting made aware via secure chat. Pt states he felt "funny" for a second and now back to baselines, denies chest pain. Will continue to monitor closely.

## 2020-08-09 NOTE — Progress Notes (Signed)
Progress Note  Patient Name: [name] [name] Date of Encounter: 08/09/2020  [facility] Cardiologist: Ida Rogue, MD   Subjective   Feels better compared to admission.  Left lower extremity infection is getting better.  Denies palpitations.  Inpatient Medications    Scheduled Meds: . apixaban  5 mg Oral BID  . aspirin  81 mg Oral Daily  . atorvastatin  80 mg Oral Daily  . carvedilol  3.125 mg Oral BID WC  . clopidogrel  75 mg Oral Daily  . digoxin  0.125 mg Oral Daily  . DULoxetine  30 mg Oral Daily  . ezetimibe  10 mg Oral Daily  . furosemide  40 mg Intravenous BID  . gabapentin  800 mg Oral QID  . insulin aspart  0-15 Units Subcutaneous TID WC  . isosorbide mononitrate  30 mg Oral Daily  . mometasone-formoterol  2 puff Inhalation BID  . pantoprazole  40 mg Oral Daily  . potassium chloride  10 mEq Oral BID  . sodium chloride flush  3 mL Intravenous Q12H  . tamsulosin  0.4 mg Oral QPC supper  . traZODone  150 mg Oral QHS  . Vericiguat  5 mg Oral Daily   Continuous Infusions: . piperacillin-tazobactam (ZOSYN)  IV Stopped (08/09/20 0902)  . vancomycin Stopped (08/09/20 0251)   PRN Meds: acetaminophen **OR** [DISCONTINUED] acetaminophen, albuterol, nitroGLYCERIN, oxyCODONE, polyethylene glycol   Vital Signs    Vitals:   08/09/20 0517 08/09/20 0808 08/09/20 0840 08/09/20 1152  BP: 137/66 136/66 (!) 135/59 117/71  Pulse: 62 68 66 64  Resp: '16 18 16   '$ Temp: 98.3 F (36.8 C) 98.3 F (36.8 C)  98.2 F (36.8 C)  TempSrc: Oral Oral  Oral  SpO2: 93% 93% 92% 99%  Weight: 83.8 kg     Height:        Intake/Output Summary (Last 24 hours) at 08/09/2020 1225 Last data filed at 08/09/2020 1047 Gross per 24 hour  Intake 1505.56 ml  Output 4390 ml  Net -2884.44 ml   Last 3 Weights 08/09/2020 08/08/2020 08/07/2020  Weight (lbs) 184 lb 11.9 oz 188 lb 3.2 oz 184 lb 11.9 oz  Weight (kg) 83.8 kg 85.367 kg 83.8 kg  Some encounter information is confidential and restricted. Go  to Review Flowsheets activity to see all data.      Telemetry    Atrial fibrillation, heart rate 82, nonsustained VT, 7 beats noted- Personally Reviewed  ECG    No new ECG obtained- Personally Reviewed  Physical Exam   GEN: No acute distress.   Neck: No JVD Cardiac:  Irregular irregular, no murmurs Respiratory:  Poor inspiratory effort, decreased breath sounds at bases GI: Soft, nontender, distended  MS: No edema; R. BKA noted Neuro:  Nonfocal  Psych: Normal affect   Labs    High Sensitivity Troponin:   Recent Labs  Lab 08/06/20 0205 08/06/20 0415 08/06/20 0715 08/07/20 1057 08/08/20 0448  TROPONINIHS 863* 1,190* 1,186* 504* 440*      Chemistry Recent Labs  Lab 08/07/20 0406 08/08/20 0448 08/08/20 1245 08/09/20 0422  NA 139 136  --  134*  K 3.8 3.7  --  4.1  CL 100 97*  --  95*  CO2 29 30  --  30  GLUCOSE 95 112*  --  110*  BUN 12 15  --  17  CREATININE 1.26* 1.28* 1.16 1.23  CALCIUM 9.0 8.7*  --  9.3  PROT 6.1* 6.4*  --  6.3*  ALBUMIN 3.2* 3.4*  --  3.4*  AST 18 17  --  16  ALT 8 9  --  9  ALKPHOS 74 69  --  69  BILITOT 2.0* 1.8*  --  1.9*  GFRNONAA >60 >60 >60 >60  ANIONGAP 10 9  --  9     Hematology Recent Labs  Lab 08/07/20 0406 08/08/20 0448 08/09/20 0422  WBC 7.1 6.8 7.1  RBC 4.29 4.36 4.43  HGB 12.0* 12.4* 12.6*  HCT 37.2* 37.4* 37.5*  MCV 86.7 85.8 84.7  MCH 28.0 28.4 28.4  MCHC 32.3 33.2 33.6  RDW 21.1* 20.5* 19.9*  PLT 123* 121* 124*    BNP Recent Labs  Lab 08/05/20 1844 08/07/20 1057 08/08/20 0448  BNP 1,069.1* 1,042.9* 1,395.9*     DDimer No results for input(s): DDIMER in the last 168 hours.   Radiology    No results found.  Cardiac Studies   Echo 06/17/2020 1. Left ventricular ejection fraction, by estimation, is <20%. Left  ventricular ejection fraction by PLAX is 14 %. The left ventricle has  severely decreased function. The left ventricle demonstrates global  hypokinesis. The left ventricular internal   cavity size was severely dilated. Left ventricular diastolic parameters  are indeterminate.  2. Right ventricular systolic function is severely reduced. The right  ventricular size is moderately enlarged. There is severely elevated  pulmonary artery systolic pressure. The estimated right ventricular  systolic pressure is [id number] mmHg.  3. Left atrial size was severely dilated.  4. Right atrial size was severely dilated.  5. Tricuspid valve regurgitation is mild to moderate.  6. The inferior vena cava is dilated in size with <50% respiratory  variability, suggesting right atrial pressure of 15 mmHg.  Patient Profile     60 y.o. male CAD/PCI, multivessel CAD, ischemic cardiomyopathy, EF less than 20%, PAD/SFA stent, right BKA presenting with lower extremity cellulitis, diagnosed with NSTEMI.  Assessment & Plan    1.  NSTEMI, history of multivessel CAD -Denies chest pain.  Medical management. -Status post heparin x48 hours -Stop heparin, start Eliquis 5 mg twice daily -Plavix, Lipitor, Imdur  2.  Permanent atrial fibrillation -Heart rate controlled -Coreg, digoxin.  3.  Ischemic cardiomyopathy, EF 20% -Coreg.  Start losartan 12.5 mg daily. -Titrate CHF meds as BP permits -Decrease Lasix to 40 mg daily. -Creatinine normal  Total encounter time 35 minutes  Greater than 50% was spent in counseling and coordination of care with the patient     Signed, Kate Sable, MD  08/09/2020, 12:25 PM

## 2020-08-09 NOTE — Consult Note (Signed)
ORTHOPAEDIC CONSULTATION  REQUESTING PHYSICIAN: Loletha Grayer, MD  Chief Complaint: Left 2nd toe   HPI: [name] [name] is a 60 y.o. male who complains of  Swelling left 2nd toe.  Worsening of recent.  Hx of BKA on right.  DM with neuropathy.  Recent redness and swelling.  No injury.  Past Medical History:  Diagnosis Date  . Acute on chronic heart failure ([place])   . AKI (acute kidney injury) ([place]) 01/03/2018  . Angina at rest [facility]) 08/13/2017  . Arthritis   . Asthma   . Atherosclerosis   . BPH (benign prostatic hyperplasia)   . Carotid arterial disease ([place])   . Cellulitis of foot, left 09/27/2019  . Charcot's joint of foot, right   . Chronic combined systolic (congestive) and diastolic (congestive) heart failure ([place])    a. 02/2013 EF 50% by LV gram; b. 04/2017 Echo: EF 25-30%. diff HK. Gr2 DD. Mod dil LA/RV. PASP 70mHg.  .Marland KitchenChronic foot pain (Secondary Area of Pain) (Right) 01/29/2018  . COPD (chronic obstructive pulmonary disease) ([place]   . COPD with acute exacerbation ([place] 01/30/2020  . Coronary artery disease    a. 2013 S/P PCI of LAD ([facility];  b. 03/2013 PCI: RCA 90p ( 2.5 x 23 mm DES); c. 02/2014 Cath: patent RCA stent->Med Rx; d. 04/2017 Cath: LM nl, LAd 30ost/p, 692m, D1 80ost, LCX 90p/m, OM1 85, RCA 70/20p, 8075m0d->Referred for CT Surg-felt to be poor candidate.  . Coronary artery disease of native artery of native heart with stable angina pectoris (HCCPecan [place]/08/2017   [facility] Nuclear stress test showed inferior wall defect. Cardiac catheterization showed patent LAD stent with 90% stenosis in the proximal RCA. Ejection fraction was 50%. Status post PCI of the RCA with a 2.5 x 23 mm drug-eluting stent  . Diabetic neuropathy (HCCMamers . Diabetic ulcer of right foot (HCCCleveland . Hernia   . HFrEF (heart failure with reduced ejection fraction) (HCCCarver/22/2021  . Hyperlipidemia   . Hypertension   .  Ischemic cardiomyopathy    a. 04/2017 Echo: EF 25-30%; b. 08/2017 Cardiac MRI ([facility]): EF 19%, sev glob HK. RVEF 20%, mod BAE, triv MR, mild-mod TR. Basal lateral subendocardial infarct (viable), inf/infsept, dist septal ischemia, basal to mid lat peri-infarct ischemia.  . Morbid obesity (HCCLodge Grass . Neuropathy   . PAD (peripheral artery disease) (HCCBeattystown  a. Followed by Dr. DewLucky Cowboy. 08/2010 Periph Angio: RSFA 70-80p (6X60 self-expanding stent); c. 09/2010 Periph Angio: L SFA 100p (7x unknown length self-expanding stent); d. 06/2015 Periph Angio: R SFA short segment occlusion (6x12 self-expanding stent).  . Restless leg syndrome   . Sepsis (HCCLimestone0/21/2019  . Sleep apnea   . Subclavian artery stenosis, right (HCCMasontown . Syncope and collapse   . Tobacco use    a. 75+ yr hx - still smoking 1ppd, down from 2 ppd.  . Type II diabetes mellitus (HCCAthol . Unstable angina (HCCLong [place]/06/2016  . Varicose vein    Past Surgical History:  Procedure Laterality Date  . ABDOMINAL AORTAGRAM N/A 06/23/2013   Procedure: ABDOMINAL AORMaxcine HamSurgeon: MuhWellington HampshireD;  [facility];  Service: Cardiovascular;  Laterality: N/A;  . AMPUTATION Right 04/01/2018   Procedure: AMPUTATION BELOW KNEE;  Surgeon: DewAlgernon HuxleyD;  Location: [facility] ORS;  Service: Vascular;  Laterality: Right;  .  APPENDECTOMY    . CARDIAC CATHETERIZATION  10/14   [facility]; X1 STENT PROXIMAL RCA  . CARDIAC CATHETERIZATION  02/2010   [facility]  . CARDIAC CATHETERIZATION  02/21/2014   [facility]  . CLAVICLE SURGERY    . HERNIA REPAIR    . IRRIGATION AND DEBRIDEMENT FOOT Right 01/04/2018   Procedure: IRRIGATION AND DEBRIDEMENT FOOT;  Surgeon: Samara Deist, DPM;  Location: [facility] ORS;  Service: Podiatry;  Laterality: Right;  . LEFT HEART CATH AND CORONARY ANGIOGRAPHY N/A 01/20/2020   Procedure: LEFT HEART CATH AND CORONARY ANGIOGRAPHY;  Surgeon: Wellington Hampshire, MD;  Location: [place] CV LAB;  Service: Cardiovascular;  Laterality: N/A;  . LEFT HEART  CATH AND CORS/GRAFTS ANGIOGRAPHY N/A 04/28/2017   Procedure: LEFT HEART CATH AND CORONARY ANGIOGRAPHY;  Surgeon: Wellington Hampshire, MD;  Location: [place] CV LAB;  Service: Cardiovascular;  Laterality: N/A;  . LOWER EXTREMITY ANGIOGRAPHY Right 03/03/2018   Procedure: LOWER EXTREMITY ANGIOGRAPHY;  Surgeon: Katha Cabal, MD;  Location: [place] CV LAB;  Service: Cardiovascular;  Laterality: Right;  . LOWER EXTREMITY ANGIOGRAPHY Left 08/24/2018   Procedure: LOWER EXTREMITY ANGIOGRAPHY;  Surgeon: Algernon Huxley, MD;  Location: [place] CV LAB;  Service: Cardiovascular;  Laterality: Left;  . LOWER EXTREMITY ANGIOGRAPHY Left 06/14/2019   Procedure: LOWER EXTREMITY ANGIOGRAPHY;  Surgeon: Algernon Huxley, MD;  Location: [place] CV LAB;  Service: Cardiovascular;  Laterality: Left;  . LOWER EXTREMITY ANGIOGRAPHY Left 08/02/2019   Procedure: LOWER EXTREMITY ANGIOGRAPHY;  Surgeon: Algernon Huxley, MD;  Location: [place] CV LAB;  Service: Cardiovascular;  Laterality: Left;  . LOWER EXTREMITY ANGIOGRAPHY Left 07/17/2020   Procedure: LOWER EXTREMITY ANGIOGRAPHY;  Surgeon: Algernon Huxley, MD;  Location: [place] CV LAB;  Service: Cardiovascular;  Laterality: Left;  . PERIPHERAL ARTERIAL STENT GRAFT     x2 left/right  . PERIPHERAL VASCULAR BALLOON ANGIOPLASTY Right 01/06/2018   Procedure: PERIPHERAL VASCULAR BALLOON ANGIOPLASTY;  Surgeon: Katha Cabal, MD;  Location: [place] CV LAB;  Service: Cardiovascular;  Laterality: Right;  . PERIPHERAL VASCULAR CATHETERIZATION N/A 07/06/2015   Procedure: Abdominal Aortogram w/Lower Extremity;  Surgeon: Algernon Huxley, MD;  Location: [place] CV LAB;  Service: Cardiovascular;  Laterality: N/A;  . PERIPHERAL VASCULAR CATHETERIZATION  07/06/2015   Procedure: Lower Extremity Intervention;  Surgeon: Algernon Huxley, MD;  Location: [place] CV LAB;  Service: Cardiovascular;;   Social History   Socioeconomic History  . Marital status: Single     Spouse name: Not on file  . Number of children: 0  . Years of education: 94  . Highest education level: 12th grade  Occupational History  . Occupation: on disability  Tobacco Use  . Smoking status: Current Every Day Smoker    Packs/day: 1.00    Years: 41.00    Pack years: 41.00    Types: Cigarettes  . Smokeless tobacco: Never Used  Vaping Use  . Vaping Use: Never used  Substance and Sexual Activity  . Alcohol use: No  . Drug use: No  . Sexual activity: Yes  Other Topics Concern  . Not on file  Social History Narrative   Lives at home in [place] with girlfriend. Independent at baseline.   Social Determinants of Health   Financial Resource Strain: Not on file  Food Insecurity: Not on file  Transportation Needs: Not on file  Physical Activity: Not on file  Stress: Not on file  Social Connections: Not on file   Family History  Problem Relation Age of Onset  . Heart attack Father   . Heart disease Father   . Hypertension Father   . Hyperlipidemia Father   . Hypertension Mother   . Hyperlipidemia Mother    Allergies  Allergen Reactions  . Dulaglutide Anaphylaxis, Diarrhea and Hives  . Ace Inhibitors     hypotension  . Other Itching    Skin itching associated with nitro patch  . Prednisone Rash   Prior to Admission medications   Medication Sig Start Date End Date Taking? Authorizing Provider  albuterol (VENTOLIN HFA) 108 (90 Base) MCG/ACT inhaler Inhale into the lungs every 6 (six) hours as needed for wheezing or shortness of breath.   Yes [provider]  atorvastatin (LIPITOR) 80 MG tablet Take 80 mg by mouth daily.   Yes [provider]  carvedilol (COREG) 3.125 MG tablet Take 1 tablet (3.125 mg total) by mouth 2 (two) times daily with a meal. 07/01/20  Yes Shawna Clamp, MD  clopidogrel (PLAVIX) 75 MG tablet Take 75 mg by mouth daily.  08/22/17  Yes [provider]  digoxin (LANOXIN) 0.125 MG tablet Take 1 tablet (0.125 mg total)  by mouth daily. 07/02/20  Yes Shawna Clamp, MD  DULoxetine (CYMBALTA) 30 MG capsule Take 30 mg by mouth daily.    Yes [provider]  ELIQUIS 5 MG TABS tablet Take 5 mg by mouth 2 (two) times daily. 02/02/20  Yes [provider]  empagliflozin (JARDIANCE) 10 MG TABS tablet Take 10 mg by mouth daily.  02/03/20  Yes [provider]  ezetimibe (ZETIA) 10 MG tablet Take 10 mg by mouth daily.   Yes [provider]  gabapentin (NEURONTIN) 800 MG tablet Take 800 mg by mouth 4 (four) times daily.  08/14/17 04/04/29 Yes [provider]  ipratropium-albuterol (DUONEB) 0.5-2.5 (3) MG/3ML SOLN Inhale 3 mLs into the lungs every 6 (six) hours as needed (wheezing/sob).    Yes [provider]  isosorbide mononitrate (IMDUR) 30 MG 24 hr tablet Take 30 mg by mouth daily.   Yes [provider]  levofloxacin (LEVAQUIN) 750 MG tablet Take 1 tablet (750 mg total) by mouth daily for 10 days. 08/03/20 08/13/20 Yes Kris Hartmann, NP  Multiple Vitamin (MULTI-VITAMINS) TABS Take 1 tablet by mouth daily.    Yes [provider]  nitroGLYCERIN (NITROSTAT) 0.4 MG SL tablet Place 1 tablet (0.4 mg total) under the tongue every 5 (five) minutes as needed for chest pain. 05/19/19  Yes Hackney, Otila Kluver A, FNP  omeprazole (PRILOSEC) 40 MG capsule Take 40 mg by mouth daily.   Yes [provider]  Oxycodone HCl 10 MG TABS Take 10 mg by mouth every 6 (six) hours as needed (pain).  01/15/18  Yes [provider]  potassium chloride (KLOR-CON) 10 MEQ tablet Take 10 mEq by mouth 2 (two) times daily.   Yes [provider]  sodium chloride 1 g tablet Take 1 tablet (1 g total) by mouth 3 (three) times daily with meals. 06/12/20  Yes Darylene Price A, FNP  spironolactone (ALDACTONE) 25 MG tablet Take 0.5 tablets (12.5 mg total) by mouth daily. 07/02/20  Yes Shawna Clamp, MD  SYMBICORT 80-4.5 MCG/ACT inhaler Inhale 2 puffs into the lungs daily.  01/01/20  Yes  [provider]  tamsulosin (FLOMAX) 0.4 MG CAPS capsule Take 1 capsule (0.4 mg total) by mouth daily after supper. 07/01/20  Yes Shawna Clamp, MD  torsemide (DEMADEX) 20 MG tablet Take 2 tablets (40  mg total) by mouth daily. 07/01/20  Yes Shawna Clamp, MD  traZODone (DESYREL) 150 MG tablet Take 150 mg by mouth at bedtime. 07/12/19  Yes [provider]  Vericiguat 5 MG TABS Take 5 mg by mouth daily.   Yes [provider]  Ipratropium-Albuterol (COMBIVENT RESPIMAT) 20-100 MCG/ACT AERS respimat Inhale 1 puff into the lungs every 6 (six) hours as needed for wheezing. Patient not taking: Reported on 08/05/2020    [provider]   No results found.  Positive ROS: All other systems have been reviewed and were otherwise negative with the exception of those mentioned in the HPI and as above.  12 point ROS was performed.  Physical Exam: General: Alert and oriented.  No apparent distress.  Vascular:  Left foot:Dorsalis Pedis:  absent Posterior Tibial:  absent  Right foot: BKA  Neuro:absent sensation  Derm:Left 2nd toe with superficial ulcer medial pipj.  Also fissure to plantar PIPJ.  Callus distal tip of toe.  Mild erythema diffusely to toe.  Ortho/MS: Diffuse edema left 2nd toe S/P right bka  I personally reviewed xray and with magnification there appears to be erosion of the distal tip of 2nd toe.  MRI is pending.     Assessment: DM with neurpathy DM ulcer left 2nd toe. Suspect at least osteomyelitis of distal toe.  MRI is pending.  Plan: Will f/u tomorrow to re-evaluate the tip of the toe and likely debride ulceration at bedside.  Pending MRI.    Elesa Hacker, DPM Cell [phone]   08/09/2020 4:32 PM

## 2020-08-09 NOTE — Progress Notes (Signed)
ANTICOAGULATION CONSULT NOTE  Pharmacy Consult for Heparin infusion  Indication: chest pain/ACS  Patient Measurements: Height: '5\' 7"'$  (170.2 cm) Weight: 83.8 kg (184 lb 11.9 oz) IBW/kg (Calculated) : 66.1  Vital Signs: Temp: 98.3 F (36.8 C) (03/02 0517) Temp Source: Oral (03/02 0517) BP: 137/66 (03/02 0517) Pulse Rate: 62 (03/02 0517)  Labs: Recent Labs     0000 08/06/20 0715 08/06/20 1433 08/06/20 2139 08/07/20 0406 08/07/20 1057 08/07/20 1838 08/08/20 0041 08/08/20 0448 08/08/20 1245 08/09/20 0422  HGB  --   --   --    < > 12.0*  --   --   --  12.4*  --  12.6*  HCT  --   --   --   --  37.2*  --   --   --  37.4*  --  37.5*  PLT  --   --   --   --  123*  --   --   --  121*  --  124*  APTT  --   --  37*   < > 66* 51* 78* 93*  --   --   --   LABPROT  --   --  16.5*  --   --   --   --   --   --   --   --   INR  --   --  1.4*  --   --   --   --   --   --   --   --   HEPARINUNFRC   < >  --  >2.20*  --  1.47*  --   --   --  0.43  --  0.27*  CREATININE  --   --   --   --  1.26*  --   --   --  1.28* 1.16  --   TROPONINIHS  --  1,186*  --   --   --  504*  --   --  440*  --   --    < > = values in this interval not displayed.    Estimated Creatinine Clearance: 72.7 mL/min (by C-G formula based on SCr of 1.16 mg/dL).   Medical History: Past Medical History:  Diagnosis Date  . Acute on chronic heart failure ([place])   . AKI (acute kidney injury) ([place]) 01/03/2018  . Angina at rest [facility]) 08/13/2017  . Arthritis   . Asthma   . Atherosclerosis   . BPH (benign prostatic hyperplasia)   . Carotid arterial disease ([place])   . Cellulitis of foot, left 09/27/2019  . Charcot's joint of foot, right   . Chronic combined systolic (congestive) and diastolic (congestive) heart failure ([place])    a. 02/2013 EF 50% by LV gram; b. 04/2017 Echo: EF 25-30%. diff HK. Gr2 DD. Mod dil LA/RV. PASP 49mHg.  .Marland KitchenChronic foot pain (Secondary Area of Pain) (Right) 01/29/2018  . COPD (chronic obstructive  pulmonary disease) (HBrook[place]   . COPD with acute exacerbation ([place] 01/30/2020  . Coronary artery disease    a. 2013 S/P PCI of LAD ([facility];  b. 03/2013 PCI: RCA 90p ( 2.5 x 23 mm DES); c. 02/2014 Cath: patent RCA stent->Med Rx; d. 04/2017 Cath: LM nl, LAd 30ost/p, 680m, D1 80ost, LCX 90p/m, OM1 85, RCA 70/20p, 8042m0d->Referred for CT Surg-felt to be poor candidate.  . Coronary artery disease of native artery of native heart with stable angina pectoris (HCCCedar [place]/08/2017   Status post LAD  PCI in 2013 at [facility]. [facility] with unstable angina. Nuclear stress test showed inferior wall defect. Cardiac catheterization showed patent LAD stent with 90% stenosis in the proximal RCA. Ejection fraction was 50%. Status post PCI of the RCA with a 2.5 x 23 mm drug-eluting stent  . Diabetic neuropathy ([employer])   . Diabetic ulcer of right foot ([place])   . Hernia   . HFrEF (heart failure with reduced ejection fraction) ([place]) 01/30/2020  . Hyperlipidemia   . Hypertension   . Ischemic cardiomyopathy    a. 04/2017 Echo: EF 25-30%; b. 08/2017 Cardiac MRI ([facility]): EF 19%, sev glob HK. RVEF 20%, mod BAE, triv MR, mild-mod TR. Basal lateral subendocardial infarct (viable), inf/infsept, dist septal ischemia, basal to mid lat peri-infarct ischemia.  . Morbid obesity ([place])   . Neuropathy   . PAD (peripheral artery disease) ([place])    a. Followed by Dr. Lucky Cowboy; b. 08/2010 Periph Angio: RSFA 70-80p (6X60 self-expanding stent); c. 09/2010 Periph Angio: L SFA 100p (7x unknown length self-expanding stent); d. 06/2015 Periph Angio: R SFA short segment occlusion (6x12 self-expanding stent).  . Restless leg syndrome   . Sepsis ([place]) 03/30/2018  . Sleep apnea   . Subclavian artery stenosis, right ([place])   . Syncope and collapse   . Tobacco use    a. 75+ yr hx - still smoking 1ppd, down from 2 ppd.  . Type II diabetes mellitus ([place])   . Unstable angina ([place]) 07/11/2016  . Varicose vein      Assessment: 60 yo male admitted  with cellulitis. PMH of DM, CHF, COPD, CAD, and A. Fib (on Eliquis). Troponin HS elevated at 1190 but now trending down. Pharmacy has been consulted for heparin infusion dosing and monitoring. H&H stable, platelets low at baseline but appears stable  Goal of Therapy:  Heparin level 0.3-0.7 units/ml aPTT 66-102 seconds Monitor platelets by anticoagulation protocol: Yes   Date Time aPTT HL Rate/comment 2/28 1057 51  1150 2/28 1838 78  1450 3/01     0041    92                    1450   Plan:  3/1:  APTT @ 0041 = 92, therapeutic X 2   3/1:  HL @ 0448 = 0.43 HL and aPTT are both therapeutic so will use HL to dose heparin. Will recheck HL on 3/2 with AM labs.   3/2: HL @ 0422 = 0.27, subtherapeutic Will increase Heparin to 1600 units/hr and recheck in 6 hours  Ena Dawley, PharmD 08/09/2020 5:52 AM

## 2020-08-09 NOTE — Care Management Important Message (Signed)
Important Message  Patient Details  Name: [name] [name] MRN: [id number] Date of Birth: 24-May-1961   Medicare Important Message Given:  Yes     [name] 08/09/2020, 11:12 AM

## 2020-08-09 NOTE — Progress Notes (Signed)
Patient ID: [name] [name], male   DOB: 12/03/1960, 60 y.o.   MRN: [id number] [facility] PROGRESS NOTE  [name] [name] [id number] DOB: Nov 07, 1960 DOA: 08/05/2020 PCP: [name], MD  HPI/Subjective: Patient admitted with cellulitis.  He states his second left toe is more swollen and still painful and still red.  Patient has been on antibiotics IV for few days.  Also had NSTEMI and being diuresed.  Nursing notified me that he had a few beats of nonsustained V. tach this morning.  Objective: Vitals:   08/09/20 0840 08/09/20 1152  BP: (!) 135/59 117/71  Pulse: 66 64  Resp: 16   Temp:  98.2 F (36.8 C)  SpO2: 92% 99%    Intake/Output Summary (Last 24 hours) at 08/09/2020 1502 Last data filed at 08/09/2020 1325 Gross per 24 hour  Intake 1745.56 ml  Output 4865 ml  Net -3119.44 ml   Filed Weights   08/07/20 0523 08/08/20 0601 08/09/20 0517  Weight: 83.8 kg 85.4 kg 83.8 kg    ROS: Review of Systems  Respiratory: Negative for shortness of breath.   Cardiovascular: Negative for chest pain.  Gastrointestinal: Negative for abdominal pain, nausea and vomiting.  Musculoskeletal: Negative for joint pain.   Exam: Physical Exam HENT:     Head: Normocephalic.     Mouth/Throat:     Pharynx: No oropharyngeal exudate.  Eyes:     General: Lids are normal.     Conjunctiva/sclera: Conjunctivae normal.     Pupils: Pupils are equal, round, and reactive to light.  Cardiovascular:     Rate and Rhythm: Normal rate and regular rhythm.     Heart sounds: Normal heart sounds, S1 normal and S2 normal.  Pulmonary:     Breath sounds: No decreased breath sounds, wheezing, rhonchi or rales.  Abdominal:     Palpations: Abdomen is soft.     Tenderness: There is no abdominal tenderness.  Musculoskeletal:     Left lower leg: Swelling present.  Skin:    General: Skin is warm.     Comments: Left lower extremity chronic lower extremity discoloration.  Left second toe swollen slight brownish  discoloration.  On the plantar surface there is a crack where infection could get in.  Neurological:     Mental Status: He is alert and oriented to person, place, and time.       Data Reviewed: Basic Metabolic Panel: Recent Labs  Lab 08/05/20 1841 08/06/20 0403 08/07/20 0406 08/08/20 0448 08/08/20 1245 08/09/20 0422  NA 135 135 139 136  --  134*  K 4.0 4.2 3.8 3.7  --  4.1  CL 92* 96* 100 97*  --  95*  CO2 31 32 29 30  --  30  GLUCOSE 185* 97 95 112*  --  110*  BUN '10 10 12 15  '$ --  17  CREATININE 1.19 1.09 1.26* 1.28* 1.16 1.23  CALCIUM 9.1 8.8* 9.0 8.7*  --  9.3  MG 1.9  --  2.1  --   --  2.1   Liver Function Tests: Recent Labs  Lab 08/05/20 1841 08/06/20 0403 08/06/20 0715 08/07/20 0406 08/08/20 0448 08/09/20 0422  AST 17 33  --  '18 17 16  '$ ALT 9 9  --  '8 9 9  '$ ALKPHOS 92 78  --  74 69 69  BILITOT 1.6* 1.6* 1.6* 2.0* 1.8* 1.9*  PROT 6.7 6.2*  --  6.1* 6.4* 6.3*  ALBUMIN 3.6 3.1*  --  3.2* 3.4*  3.4*   CBC: Recent Labs  Lab 08/05/20 1841 08/06/20 0403 08/07/20 0406 08/08/20 0448 08/09/20 0422  WBC 7.5 6.4 7.1 6.8 7.1  NEUTROABS 6.1  --   --   --   --   HGB 12.4* 11.4* 12.0* 12.4* 12.6*  HCT 38.7* 35.1* 37.2* 37.4* 37.5*  MCV 86.4 86.5 86.7 85.8 84.7  PLT 143* 132* 123* 121* 124*   BNP (last 3 results) Recent Labs    08/05/20 1844 08/07/20 1057 08/08/20 0448  BNP 1,069.1* 1,042.9* 1,395.9*    CBG: Recent Labs  Lab 08/08/20 1124 08/08/20 1635 08/08/20 2104 08/09/20 0726 08/09/20 1149  GLUCAP 107* 171* 181* 114* 164*    Recent Results (from the past 240 hour(s))  SARS CORONAVIRUS 2 (TAT 6-24 HRS) Nasopharyngeal Nasopharyngeal Swab     Status: None   Collection Time: 08/06/20  4:11 AM   Specimen: Nasopharyngeal Swab  Result Value Ref Range Status   SARS Coronavirus 2 NEGATIVE NEGATIVE Final    Comment: (NOTE) SARS-CoV-2 target nucleic acids are NOT DETECTED.  The SARS-CoV-2 RNA is generally detectable in upper and lower respiratory  specimens during the acute phase of infection. Negative results do not preclude SARS-CoV-2 infection, do not rule out co-infections with other pathogens, and should not be used as the sole basis for treatment or other patient management decisions. Negative results must be combined with clinical observations, patient history, and epidemiological information. The expected result is Negative.  Fact Sheet for Patients: SugarRoll.be  Fact Sheet for Healthcare Providers: https://www.woods-mathews.com/  This test is not yet approved or cleared by the [place] FDA and  has been authorized for detection and/or diagnosis of SARS-CoV-2 by FDA under an Emergency Use Authorization (EUA). This EUA will remain  in effect (meaning this test can be used) for the duration of the COVID-19 declaration under Se ction 564(b)(1) of the Act, 21 U.S.C. section 360bbb-3(b)(1), unless the authorization is terminated or revoked sooner.  Performed at [facility], [place] [address], [place], [place] [address]      Scheduled Meds: . apixaban  5 mg Oral BID  . atorvastatin  80 mg Oral Daily  . carvedilol  3.125 mg Oral BID WC  . clopidogrel  75 mg Oral Daily  . digoxin  0.125 mg Oral Daily  . DULoxetine  30 mg Oral Daily  . ezetimibe  10 mg Oral Daily  . [START ON 08/10/2020] furosemide  40 mg Intravenous Daily  . gabapentin  800 mg Oral QID  . insulin aspart  0-15 Units Subcutaneous TID WC  . isosorbide mononitrate  30 mg Oral Daily  . losartan  12.5 mg Oral Daily  . mometasone-formoterol  2 puff Inhalation BID  . pantoprazole  40 mg Oral Daily  . potassium chloride  10 mEq Oral BID  . sodium chloride flush  3 mL Intravenous Q12H  . tamsulosin  0.4 mg Oral QPC supper  . traZODone  150 mg Oral QHS  . Vericiguat  5 mg Oral Daily   Continuous Infusions: . piperacillin-tazobactam (ZOSYN)  IV 3.375 g (08/09/20 1325)  . vancomycin Stopped (08/09/20 0251)     Assessment/Plan:  1. Diabetic foot infection and cellulitis..  Second toe more swollen and red as per patient we will get an MRI to see if there is osteomyelitis.  Podiatry consultation.  Already on IV vancomycin and Zosyn. 2. NSTEMI.  Medical management on atorvastatin, Coreg, Plavix. 3. Acute on chronic systolic congestive heart failure on IV Lasix, Coreg,  digoxin, Imdur, losartan.  Blood pressure on the lower side will consider potential spironolactone if blood pressure increases. 4. Type 2 diabetes mellitus with hyperlipidemia on atorvastatin.  Hemoglobin A1c good at 6.5.  Diet controlled. 5. Severe peripheral vascular disease was initially on IV heparin now back on Eliquis and Plavix. 6. Chronic atrial fibrillation on Eliquis for anticoagulation and Coreg for rate control. 7. Diabetic neuropathy 8. Nonsustained ventricular tachycardia.  Electrolytes okay today.  Likely from decreased ejection fraction.        Code Status:     Code Status Orders  (From admission, onward)         Start     Ordered   08/05/20 2333  Full code  Continuous        08/05/20 2335        Code Status History    Date Active Date Inactive Code Status Order ID Comments User Context   07/17/2020 1120 07/17/2020 2127 Full Code [id number]  Algernon Huxley, MD Inpatient   06/30/2020 1606 07/01/2020 1943 Full Code [id number]  Rolley Sims, RN Inpatient   06/19/2020 1028 06/30/2020 1606 [id number]  Pershing Proud, NP Inpatient   06/17/2020 0509 06/19/2020 1028 Full Code [id number]  Athena Masse, MD ED   01/19/2020 1139 01/21/2020 0030 Full Code [id number]  Ivor Costa, MD ED   09/27/2019 0427 09/30/2019 1952 Full Code [id number]  Athena Masse, MD ED   08/31/2019 0109 08/31/2019 0742 Full Code [id number]  Athena Masse, MD ED   08/02/2019 1147 08/02/2019 1835 Full Code [id number]  Algernon Huxley, MD Inpatient   06/14/2019 1103 06/14/2019 1851 Full Code [id number]  Algernon Huxley, MD Inpatient   11/06/2018 0023 11/09/2018 1816  Full Code [id number]  Lance Coon, MD ED   08/24/2018 0939 08/24/2018 1444 Full Code [id number]  Algernon Huxley, MD Inpatient   07/27/2018 2248 07/28/2018 1836 Full Code [id number]  Vaughan Basta, MD Inpatient   03/30/2018 1839 04/07/2018 2116 Full Code [id number]  Nicholes Mango, MD Inpatient   03/03/2018 1044 03/03/2018 1545 Full Code [id number]  Schnier, Dolores Lory, MD Inpatient   01/03/2018 0409 01/09/2018 1723 Full Code [id number]  Lance Coon, MD Inpatient   12/22/2017 0607 12/23/2017 1916 Full Code [id number]  Harrie Foreman, MD Inpatient   11/21/2017 0415 11/23/2017 1444 Full Code [id number]  Lance Coon, MD Inpatient   04/23/2017 0355 04/29/2017 1353 Full Code [id number]  Saundra Shelling, MD Inpatient   07/11/2016 0324 07/11/2016 1851 Full Code [id number]  Saundra Shelling, MD Inpatient   05/06/2016 0005 05/07/2016 1417 Full Code [id number]  Hugelmeyer, [place], DO Inpatient   11/21/2015 1716 11/22/2015 1950 Full Code [id number]  Gladstone Lighter, MD Inpatient   07/06/2015 0948 07/06/2015 1437 Full Code [id number]  Algernon Huxley, MD Inpatient   Advance Care Planning Activity     Family Communication: Spoke with significant other on the phone Disposition Plan: Status is: Inpatient  Dispo: The patient is from: Home              Anticipated d/c is to: Home              Patient currently receiving IV antibiotics for diabetic foot infection and cellulitis   Difficult to place patient.  No.  Time spent: 28 minutes  [place]

## 2020-08-10 DIAGNOSIS — M869 Osteomyelitis, unspecified: Secondary | ICD-10-CM

## 2020-08-10 DIAGNOSIS — M86172 Other acute osteomyelitis, left ankle and foot: Secondary | ICD-10-CM

## 2020-08-10 DIAGNOSIS — J432 Centrilobular emphysema: Secondary | ICD-10-CM

## 2020-08-10 DIAGNOSIS — S88111A Complete traumatic amputation at level between knee and ankle, right lower leg, initial encounter: Secondary | ICD-10-CM

## 2020-08-10 LAB — GLUCOSE, CAPILLARY
Glucose-Capillary: 111 mg/dL — ABNORMAL HIGH (ref 70–99)
Glucose-Capillary: 141 mg/dL — ABNORMAL HIGH (ref 70–99)
Glucose-Capillary: 161 mg/dL — ABNORMAL HIGH (ref 70–99)
Glucose-Capillary: 142 mg/dL — ABNORMAL HIGH (ref 70–99)

## 2020-08-10 LAB — MAGNESIUM: Magnesium: 1.9 mg/dL (ref 1.7–2.4)

## 2020-08-10 LAB — POTASSIUM: Potassium: 3.9 mmol/L (ref 3.5–5.1)

## 2020-08-10 LAB — VANCOMYCIN, PEAK: Vancomycin Pk: 40 ug/mL (ref 30–40)

## 2020-08-10 MED ORDER — ENOXAPARIN SODIUM 40 MG/0.4ML ~~LOC~~ SOLN: 40.0000 mg | SUBCUTANEOUS | Status: DC

## 2020-08-10 MED ORDER — MAGNESIUM SULFATE IN D5W 1-5 GM/100ML-% IV SOLN
1.0000 g | Freq: Once | INTRAVENOUS | Status: AC
  Administered 2020-08-10: 1 g via INTRAVENOUS
  Filled 2020-08-10: qty 100

## 2020-08-10 MED ORDER — POTASSIUM CHLORIDE 20 MEQ PO PACK
40.0000 meq | PACK | Freq: Once | ORAL | Status: AC
  Administered 2020-08-10: 40 meq via ORAL
  Filled 2020-08-10: qty 2

## 2020-08-10 MED ORDER — ENOXAPARIN SODIUM 100 MG/ML ~~LOC~~ SOLN
1.0000 mg/kg | SUBCUTANEOUS | Status: DC
  Administered 2020-08-10: 85 mg via SUBCUTANEOUS
  Filled 2020-08-10: qty 1

## 2020-08-10 NOTE — Progress Notes (Signed)
Daily Progress Note   Subjective  - * No surgery date entered *  F/u left 2nd toe.  No complaints  Objective Vitals:   08/10/20 0006 08/10/20 0453 08/10/20 0532 08/10/20 0716  BP: 123/60  130/67 (!) 141/61  Pulse: 60  (!) 59 61  Resp: '14  16 17  '$ Temp: 98 F (36.7 C)  98.1 F (36.7 C) 98.4 F (36.9 C)  TempSrc: Oral   Oral  SpO2: 92%  91% 94%  Weight:  84.5 kg    Height:        Physical Exam: Tip of 2nd toe with hemorrhagic ulceration.  Ulcer plantar and lateral 2nd pipj as well.  Xray reviewed with noted erosion tip of toe  [id number] suggestive of cellulitis with nailbed irregularity seen at the distal second phalanx. There is findings that could be suggestive of early osteomyelitis involving the tip of the distal tuft. Reactive marrow seen throughout the distal second phalanx however. No soft tissue abscess seen.   Laboratory CBC    Component Value Date/Time   WBC 7.1 08/09/2020 0422   HGB 12.6 (L) 08/09/2020 0422   HGB 15.0 06/08/2014 1037   HCT 37.5 (L) 08/09/2020 0422   HCT 44.5 06/08/2014 1037   PLT 124 (L) 08/09/2020 0422   PLT 261 06/08/2014 1037    BMET    Component Value Date/Time   NA 134 (L) 08/09/2020 0422   NA 135 07/07/2020 1211   NA 136 06/08/2014 1037   K 3.9 08/10/2020 0556   K 4.3 06/08/2014 1037   CL 95 (L) 08/09/2020 0422   CL 101 06/08/2014 1037   CO2 30 08/09/2020 0422   CO2 27 06/08/2014 1037   GLUCOSE 110 (H) 08/09/2020 0422   GLUCOSE 415 (H) 06/08/2014 1037   BUN 17 08/09/2020 0422   BUN 42 (H) 07/07/2020 1211   BUN 9 06/08/2014 1037   CREATININE 1.23 08/09/2020 0422   CREATININE 0.87 02/05/2018 1417   CALCIUM 9.3 08/09/2020 0422   CALCIUM 9.2 06/08/2014 1037   GFRNONAA >60 08/09/2020 0422   GFRNONAA >60 06/08/2014 1037   GFRNONAA >60 02/17/2014 0909   GFRAA 52 (L) 07/07/2020 1211   GFRAA >60 06/08/2014 1037   GFRAA >60 02/17/2014 0909    Assessment/Planning: Osteomyelitis left 2nd toe with ulceration.    I  d/w pt and wife via phone my recommendation of amputation of toe given erosions of bone and residual ulceration.   Discussed alternative of local wound care and abx.  Plan for surgery tomorrow.  Discussed r/b/a/c and verbal consent given.   Samara Deist A  08/10/2020, 8:15 AM

## 2020-08-10 NOTE — Progress Notes (Signed)
Patient ID: [name] [name], male   DOB: 1960-10-16, 60 y.o.   MRN: [id number] [facility] PROGRESS NOTE  [name] [name] [id number] DOB: [birth year] DOA: 08/05/2020 PCP: [name], MD  HPI/Subjective: Patient feeling okay.  No complaints of chest pain or shortness of breath.  No abdominal pain today.  Patient's left second toe still swollen and red.  MRI concerning for osteomyelitis.  Objective: Vitals:   08/10/20 1116 08/10/20 1557  BP: (!) 124/96 109/78  Pulse: 79 61  Resp: 19 18  Temp: 98.3 F (36.8 C) 98.7 F (37.1 C)  SpO2: 93% 96%    Intake/Output Summary (Last 24 hours) at 08/10/2020 1557 Last data filed at 08/10/2020 1400 Gross per 24 hour  Intake 893.8 ml  Output 2225 ml  Net -1331.2 ml   Filed Weights   08/08/20 0601 08/09/20 0517 08/10/20 0453  Weight: 85.4 kg 83.8 kg 84.5 kg    ROS: Review of Systems  Respiratory: Negative for shortness of breath.   Cardiovascular: Negative for chest pain.  Gastrointestinal: Negative for abdominal pain, nausea and vomiting.  Musculoskeletal: Positive for joint pain.   Exam: Physical Exam HENT:     Head: Normocephalic.     Mouth/Throat:     Pharynx: No oropharyngeal exudate.  Eyes:     General: Lids are normal.     Conjunctiva/sclera: Conjunctivae normal.     Pupils: Pupils are equal, round, and reactive to light.  Cardiovascular:     Rate and Rhythm: Normal rate and regular rhythm.     Heart sounds: Normal heart sounds, S1 normal and S2 normal.  Pulmonary:     Breath sounds: Normal breath sounds. No decreased breath sounds, wheezing, rhonchi or rales.  Abdominal:     Palpations: Abdomen is soft.     Tenderness: There is no abdominal tenderness.     Hernia: A hernia is present. Hernia is present in the ventral area.  Musculoskeletal:     Left ankle: No swelling.  Skin:    General: Skin is warm.     Findings: No rash.     Comments: Left second toe swollen and reddish-gray looking.  Ulcer on the bottom of  the toe  Neurological:     Mental Status: He is alert and oriented to person, place, and time.       Data Reviewed: Basic Metabolic Panel: Recent Labs  Lab 08/05/20 1841 08/06/20 0403 08/07/20 0406 08/08/20 0448 08/08/20 1245 08/09/20 0422 08/10/20 0556  NA 135 135 139 136  --  134*  --   K 4.0 4.2 3.8 3.7  --  4.1 3.9  CL 92* 96* 100 97*  --  95*  --   CO2 31 32 29 30  --  30  --   GLUCOSE 185* 97 95 112*  --  110*  --   BUN '10 10 12 15  '$ --  17  --   CREATININE 1.19 1.09 1.26* 1.28* 1.16 1.23  --   CALCIUM 9.1 8.8* 9.0 8.7*  --  9.3  --   MG 1.9  --  2.1  --   --  2.1 1.9   Liver Function Tests: Recent Labs  Lab 08/05/20 1841 08/06/20 0403 08/06/20 0715 08/07/20 0406 08/08/20 0448 08/09/20 0422  AST 17 33  --  '18 17 16  '$ ALT 9 9  --  '8 9 9  '$ ALKPHOS 92 78  --  74 69 69  BILITOT 1.6* 1.6* 1.6* 2.0* 1.8* 1.9*  PROT 6.7 6.2*  --  6.1* 6.4* 6.3*  ALBUMIN 3.6 3.1*  --  3.2* 3.4* 3.4*   CBC: Recent Labs  Lab 08/05/20 1841 08/06/20 0403 08/07/20 0406 08/08/20 0448 08/09/20 0422  WBC 7.5 6.4 7.1 6.8 7.1  NEUTROABS 6.1  --   --   --   --   HGB 12.4* 11.4* 12.0* 12.4* 12.6*  HCT 38.7* 35.1* 37.2* 37.4* 37.5*  MCV 86.4 86.5 86.7 85.8 84.7  PLT 143* 132* 123* 121* 124*   BNP (last 3 results) Recent Labs    08/05/20 1844 08/07/20 1057 08/08/20 0448  BNP 1,069.1* 1,042.9* 1,395.9*    CBG: Recent Labs  Lab 08/09/20 1149 08/09/20 1617 08/09/20 2057 08/10/20 0728 08/10/20 1117  GLUCAP 164* 167* 161* 111* 141*    Recent Results (from the past 240 hour(s))  SARS CORONAVIRUS 2 (TAT 6-24 HRS) Nasopharyngeal Nasopharyngeal Swab     Status: None   Collection Time: 08/06/20  4:11 AM   Specimen: Nasopharyngeal Swab  Result Value Ref Range Status   SARS Coronavirus 2 NEGATIVE NEGATIVE Final    Comment: (NOTE) SARS-CoV-2 target nucleic acids are NOT DETECTED.  The SARS-CoV-2 RNA is generally detectable in upper and lower respiratory specimens during the  acute phase of infection. Negative results do not preclude SARS-CoV-2 infection, do not rule out co-infections with other pathogens, and should not be used as the sole basis for treatment or other patient management decisions. Negative results must be combined with clinical observations, patient history, and epidemiological information. The expected result is Negative.  Fact Sheet for Patients: SugarRoll.be  Fact Sheet for Healthcare Providers: https://www.woods-mathews.com/  This test is not yet approved or cleared by the [place] FDA and  has been authorized for detection and/or diagnosis of SARS-CoV-2 by FDA under an Emergency Use Authorization (EUA). This EUA will remain  in effect (meaning this test can be used) for the duration of the COVID-19 declaration under Se ction 564(b)(1) of the Act, 21 U.S.C. section 360bbb-3(b)(1), unless the authorization is terminated or revoked sooner.  Performed at [facility], [place] [address] [place], [place] [address]      Studies: MR FOOT LEFT W WO CONTRAST  Result Date: 08/10/2020 CLINICAL DATA:  Swelling of the second toe, question of osteomyelitis EXAM: MRI OF THE LEFT FOREFOOT WITHOUT AND WITH CONTRAST TECHNIQUE: Multiplanar, multisequence MR imaging of the left was performed both before and after administration of intravenous contrast. CONTRAST:  36m GADAVIST GADOBUTROL 1 MMOL/ML IV SOLN COMPARISON:  None. FINDINGS: Bones/Joint/Cartilage No fracture or dislocation. No areas of cortical destruction or periosteal reaction. There is mildly increased T2 hyperintense signal seen at the distal second phalanx with minimal enhancement, and minimal T1 hypointensity at the distal tuft. 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 No large joint effusions are noted. Ligaments The Lisfranc ligaments are intact. Muscles and Tendons Mildly increased signal is seen  within the muscles of the forefoot. The flexor and extensor tendons are intact. The plantar fascia is intact. Soft tissues Mild soft tissue swelling and subcutaneous edema seen surrounding the distal phalanx of the second digit. There is nailbed irregularity seen at the second digit. No loculated fluid collections are identified. IMPRESSION: Findings suggestive of cellulitis with nailbed irregularity seen at the distal second phalanx. There is findings that could be suggestive of early osteomyelitis involving the tip of the distal tuft. Reactive marrow seen throughout the distal second phalanx however. No soft tissue abscess seen.  Electronically Signed   By: Prudencio Pair M.D.   On: 08/10/2020 00:38    Scheduled Meds: . atorvastatin  80 mg Oral Daily  . carvedilol  3.125 mg Oral BID WC  . clopidogrel  75 mg Oral Daily  . digoxin  0.125 mg Oral Daily  . DULoxetine  30 mg Oral Daily  . enoxaparin (LOVENOX) injection  40 mg Subcutaneous Q24H  . ezetimibe  10 mg Oral Daily  . furosemide  40 mg Intravenous Daily  . gabapentin  800 mg Oral QID  . insulin aspart  0-15 Units Subcutaneous TID WC  . isosorbide mononitrate  30 mg Oral Daily  . losartan  12.5 mg Oral Daily  . mometasone-formoterol  2 puff Inhalation BID  . pantoprazole  40 mg Oral Daily  . potassium chloride  10 mEq Oral BID  . sodium chloride flush  3 mL Intravenous Q12H  . tamsulosin  0.4 mg Oral QPC supper  . traZODone  150 mg Oral QHS  . Vericiguat  5 mg Oral Daily   Continuous Infusions: . sodium chloride 10 mL/hr at 08/10/20 0306  . piperacillin-tazobactam (ZOSYN)  IV 3.375 g (08/10/20 1056)  . vancomycin 1,500 mg (08/10/20 0014)    Assessment/Plan:  1. Osteomyelitis second toe on the left.  Diabetic foot infection with cellulitis.  Appreciate podiatry consultation.  For toe amputation tomorrow.  Continue IV vancomycin and IV Zosyn.  Patient moderate risk for cardiovascular complications.  Seen by cardiology. 2. Recent  NSTEMI.  Medical management on atorvastatin, Coreg and Plavix 3. Acute on chronic systolic congestive heart failure on IV Lasix, Coreg digoxin Imdur and losartan. 4. Type 2 diabetes mellitus with hyperlipidemia.  On atorvastatin.  Hemoglobin A1c good at 6.5.  Diet controlled. 5. Severe peripheral vascular disease.  Holding Eliquis tonight and put on Lovenox injection tonight.  Will restart Eliquis after procedure.  Currently on Plavix. 6. Chronic atrial fibrillation on Eliquis for anticoagulation and Coreg for rate control. 7. Diabetic neuropathy 8. Nonsustained ventricular tachycardia      Code Status:     Code Status Orders  (From admission, onward)         Start     Ordered   08/05/20 2333  Full code  Continuous        08/05/20 2335        Code Status History    Date Active Date Inactive Code Status Order ID Comments User Context   07/17/2020 1120 07/17/2020 2127 Full Code [id number]  Algernon Huxley, MD Inpatient   06/30/2020 1606 07/01/2020 1943 Full Code [id number]  Rolley Sims, RN Inpatient   06/19/2020 1028 06/30/2020 1606 [id number]  Pershing Proud, NP Inpatient   06/17/2020 0509 06/19/2020 1028 Full Code [id number]  Athena Masse, MD ED   01/19/2020 1139 01/21/2020 0030 Full Code [id number]  Ivor Costa, MD ED   09/27/2019 0427 09/30/2019 1952 Full Code [id number]  Athena Masse, MD ED   08/31/2019 0109 08/31/2019 0742 Full Code [id number]  Athena Masse, MD ED   08/02/2019 1147 08/02/2019 1835 Full Code [id number]  Algernon Huxley, MD Inpatient   06/14/2019 1103 06/14/2019 1851 Full Code [id number]  Algernon Huxley, MD Inpatient   11/06/2018 0023 11/09/2018 1816 Full Code [id number]  Lance Coon, MD ED   08/24/2018 0939 08/24/2018 1444 Full Code [id number]  Algernon Huxley, MD Inpatient   07/27/2018 2248 07/28/2018 1836 Full Code [id number]  Vaughan Basta, MD Inpatient   03/30/2018 1839  04/07/2018 2116 Full Code [id number]  Nicholes Mango, MD Inpatient   03/03/2018 1044 03/03/2018 1545 Full Code  [id number]  Katha Cabal, MD Inpatient   01/03/2018 0409 01/09/2018 1723 Full Code [id number]  Lance Coon, MD Inpatient   12/22/2017 0607 12/23/2017 1916 Full Code [id number]  Harrie Foreman, MD Inpatient   11/21/2017 0415 11/23/2017 1444 Full Code [id number]  Lance Coon, MD Inpatient   04/23/2017 0355 04/29/2017 1353 Full Code [id number]  Saundra Shelling, MD Inpatient   07/11/2016 0324 07/11/2016 1851 Full Code [id number]  Saundra Shelling, MD Inpatient   05/06/2016 0005 05/07/2016 1417 Full Code [id number]  Harvie Bridge, DO Inpatient   11/21/2015 1716 11/22/2015 1950 Full Code [id number]  Gladstone Lighter, MD Inpatient   07/06/2015 0948 07/06/2015 1437 Full Code [id number]  Algernon Huxley, MD Inpatient   Advance Care Planning Activity     Family Communication: Tried to reach significant other on the phone but mailbox was full Disposition Plan: Status is: Inpatient  Dispo: The patient is from: Home              Anticipated d/c is to: Home.  Likely need a few days after amputation prior to disposition              Patient currently on IV antibiotics for osteomyelitis.  Patient to have toe amputation tomorrow.   Difficult to place patient.  No.  Consultants:  Podiatry  Cardiology  Antibiotics:  Vancomycin  Zosyn  Time spent: 28 minutes  [place]

## 2020-08-10 NOTE — Progress Notes (Signed)
CMD called again around 0430. Patient had 5 beat run PVC. checked patient , VS stable. Denies any chest pain or any symptoms. MD's new orders are as follows: order Mag level and a potassium if it was not done this am. If Mag is less than two, give to grams of IV Mag sulfate and if K is less than 4 give 40 meq po potassium chloride.

## 2020-08-10 NOTE — Anesthesia Preprocedure Evaluation (Signed)
Anesthesia Evaluation  Patient identified by MRN, date of birth, ID band Patient awake    Reviewed: Allergy & Precautions, H&P , NPO status , Patient's Chart, lab work & pertinent test results  History of Anesthesia Complications Negative for: history of anesthetic complications  Airway Mallampati: I  TM Distance: >3 FB     Dental  (+) Edentulous Lower, Edentulous Upper   Pulmonary asthma , sleep apnea , COPD, Current Smoker and Patient abstained from smoking.,  Severely elevated PA pressures   breath sounds clear to auscultation       Cardiovascular hypertension, (-) angina+ CAD, + Past MI, + Cardiac Stents, + Peripheral Vascular Disease and +CHF  + dysrhythmias Atrial Fibrillation  Rhythm:regular  1. Left ventricular ejection fraction, by estimation, is <20%. Left  ventricular ejection fraction by PLAX is 14 %. The left ventricle has  severely decreased function. The left ventricle demonstrates global  hypokinesis. The left ventricular internal  cavity size was severely dilated. Left ventricular diastolic parameters  are indeterminate.  2. Right ventricular systolic function is severely reduced. The right  ventricular size is moderately enlarged. There is severely elevated  pulmonary artery systolic pressure. The estimated right ventricular  systolic pressure is 33.3 mmHg.  3. Left atrial size was severely dilated.  4. Right atrial size was severely dilated.  5. Tricuspid valve regurgitation is mild to moderate.  6. The inferior vena cava is dilated in size with <50% respiratory  variability, suggesting right atrial pressure of 15 mmHg.    Neuro/Psych negative neurological ROS  negative psych ROS   GI/Hepatic negative GI ROS, Neg liver ROS,   Endo/Other  diabetes  Renal/GU Renal disease  negative genitourinary   Musculoskeletal   Abdominal   Peds  Hematology negative hematology ROS (+)   Anesthesia Other  Findings Known severe CAD (refused CABG) as well as severe CHF.  Admitted with foot pain but also with CP, found to have NSTEMI one week ago with ST depression and elevated troponin, now troponin trending down, on heparin infusion on the floor. Infected toe.  Past Medical History: No date: Acute on chronic heart failure ([place]) 01/03/2018: AKI (acute kidney injury) ([place]) 08/13/2017: Angina at rest [facility]) No date: Arthritis No date: Asthma No date: Atherosclerosis No date: BPH (benign prostatic hyperplasia) No date: Carotid arterial disease ([place]) 09/27/2019: Cellulitis of foot, left No date: Charcot's joint of foot, right No date: Chronic combined systolic (congestive) and diastolic  (congestive) heart failure ([place])     Comment:  a. 02/2013 EF 50% by LV gram; b. 04/2017 Echo: EF 25-30%.              diff HK. Gr2 DD. Mod dil LA/RV. PASP 34mHg. 01/29/2018: Chronic foot pain (Secondary Area of Pain) (Right) No date: COPD (chronic obstructive pulmonary disease) ([place] 01/30/2020: COPD with acute exacerbation (HCC) No date: Coronary artery disease     Comment:  a. 2013 S/P PCI of LAD ([facility];  b. 03/2013 PCI: RCA 90p (              2.5 x 23 mm DES); c. 02/2014 Cath: patent RCA stent->Med               Rx; d. 04/2017 Cath: LM nl, LAd 30ost/p, 662m, D1 80ost,              LCX 90p/m, OM1 85, RCA 70/20p, 8038m0d->Referred for CT               Surg-felt to  be poor candidate. 09/10/2017: Coronary artery disease of native artery of native heart  with stable angina pectoris [facility])     Comment:  Status post LAD PCI in 2013 at [facility]. Presented in October              of 2014 to [facility] with unstable angina. Nuclear stress test              showed inferior wall defect. Cardiac catheterization               showed patent LAD stent with 90% stenosis in the proximal              RCA. Ejection fraction was 50%. Status post PCI of the               RCA with a 2.5 x 23 mm drug-eluting stent No date: Diabetic neuropathy  (HCC) No date: Diabetic ulcer of right foot ([place]) No date: Hernia 01/30/2020: HFrEF (heart failure with reduced ejection fraction) ([place]) No date: Hyperlipidemia No date: Hypertension No date: Ischemic cardiomyopathy     Comment:  a. 04/2017 Echo: EF 25-30%; b. 08/2017 Cardiac MRI               ([facility]): EF 19%, sev glob HK. RVEF 20%, mod BAE, triv MR,               mild-mod TR. Basal lateral subendocardial infarct               (viable), inf/infsept, dist septal ischemia, basal to mid              lat peri-infarct ischemia. No date: Morbid obesity ([place]) No date: Neuropathy No date: PAD (peripheral artery disease) (HCC)     Comment:  a. Followed by Dr. Lucky Cowboy; b. 08/2010 Periph Angio: RSFA               70-80p (6X60 self-expanding stent); c. 09/2010 Periph               Angio: L SFA 100p (7x unknown length self-expanding               stent); d. 06/2015 Periph Angio: R SFA short segment               occlusion (6x12 self-expanding stent). No date: Restless leg syndrome 03/30/2018: Sepsis ([place]) No date: Sleep apnea No date: Subclavian artery stenosis, right (HCC) No date: Syncope and collapse No date: Tobacco use     Comment:  a. 75+ yr hx - still smoking 1ppd, down from 2 ppd. No date: Type II diabetes mellitus ([place]) 07/11/2016: Unstable angina (HCC) No date: Varicose vein   Reproductive/Obstetrics negative OB ROS                           Anesthesia Physical Anesthesia Plan  ASA: IV  Anesthesia Plan: MAC   Post-op Pain Management:    Induction:   PONV Risk Score and Plan:   Airway Management Planned:   Additional Equipment:   Intra-op Plan:   Post-operative Plan:   Informed Consent: I have reviewed the patients History and Physical, chart, labs and discussed the procedure including the risks, benefits and alternatives for the proposed anesthesia with the patient or authorized representative who has indicated his/her understanding and acceptance.      Dental Advisory Given  Plan Discussed with: Anesthesiologist, CRNA and Surgeon  Anesthesia Plan Comments: (Very poor candidate for  GA. In light of severe CHF, pulmonary hypertension and recent NSTEMI, will avoid any cardiodepressive agents and avoid any increase in myocardial oxygen demand to the extent possible. KR)       Anesthesia Quick Evaluation

## 2020-08-10 NOTE — Care Plan (Signed)
[facility] supply and pharmacy called and does not carry G2, ensure clear or ensure surgery. Pharmacy states they gave Gatorade regular. MD Vickki Muff made aware and awaiting answer.

## 2020-08-10 NOTE — Progress Notes (Addendum)
Progress Note  Patient Name: [name] [name] Date of Encounter: 08/10/2020  [facility] Cardiologist: Ida Rogue, MD   Subjective   Patient well-known to me from clinic Reports compliance with his torsemide 40 mg daily at home Has not been following strict fluid protocol/restrictions Weight trending upward at home, was having a shower at home, acutely short of breath with some chest tightness presenting to the hospital  Reports feeling better after diuresis this admission, Undergoing consideration for amputation of second toe on the left foot Reports it is nonhealing Has been treated for cellulitis at site of this toe with broad-spectrum antibiotics  Podiatry following, recommendation for amputation given erosions of bone and residual ulceration Plan is for surgery tomorrow   Inpatient Medications    Scheduled Meds: . atorvastatin  80 mg Oral Daily  . carvedilol  3.125 mg Oral BID WC  . clopidogrel  75 mg Oral Daily  . digoxin  0.125 mg Oral Daily  . DULoxetine  30 mg Oral Daily  . enoxaparin (LOVENOX) injection  40 mg Subcutaneous Q24H  . ezetimibe  10 mg Oral Daily  . furosemide  40 mg Intravenous Daily  . gabapentin  800 mg Oral QID  . insulin aspart  0-15 Units Subcutaneous TID WC  . isosorbide mononitrate  30 mg Oral Daily  . losartan  12.5 mg Oral Daily  . mometasone-formoterol  2 puff Inhalation BID  . pantoprazole  40 mg Oral Daily  . potassium chloride  10 mEq Oral BID  . sodium chloride flush  3 mL Intravenous Q12H  . tamsulosin  0.4 mg Oral QPC supper  . traZODone  150 mg Oral QHS  . Vericiguat  5 mg Oral Daily   Continuous Infusions: . sodium chloride 10 mL/hr at 08/10/20 0306  . piperacillin-tazobactam (ZOSYN)  IV 3.375 g (08/10/20 1056)  . vancomycin 1,500 mg (08/10/20 0014)   PRN Meds: sodium chloride, acetaminophen **OR** [DISCONTINUED] acetaminophen, albuterol, nitroGLYCERIN, oxyCODONE, polyethylene glycol   Vital Signs    Vitals:    08/10/20 0453 08/10/20 0532 08/10/20 0716 08/10/20 1116  BP:  130/67 (!) 141/61 (!) 124/96  Pulse:  (!) 59 61 79  Resp:  '16 17 19  '$ Temp:  98.1 F (36.7 C) 98.4 F (36.9 C) 98.3 F (36.8 C)  TempSrc:   Oral Oral  SpO2:  91% 94% 93%  Weight: 84.5 kg     Height:        Intake/Output Summary (Last 24 hours) at 08/10/2020 1452 Last data filed at 08/10/2020 1400 Gross per 24 hour  Intake 893.8 ml  Output 2225 ml  Net -1331.2 ml   Last 3 Weights 08/10/2020 08/09/2020 08/08/2020  Weight (lbs) 186 lb 3.2 oz 184 lb 11.9 oz 188 lb 3.2 oz  Weight (kg) 84.46 kg 83.8 kg 85.367 kg  Some encounter information is confidential and restricted. Go to Review Flowsheets activity to see all data.      Telemetry    Atrial fibrillation- Personally Reviewed  ECG    - Personally Reviewed  Physical Exam   GEN: No acute distress.   Neck: No JVD Cardiac:  Irregularly irregular, no murmurs, rubs, or gallops.  Respiratory: Clear to auscultation bilaterally. GI: Soft, nontender, non-distended  MS: No edema; No deformity. Amputation right lower extremity Neuro:  Nonfocal  Psych: Normal affect   Labs    High Sensitivity Troponin:   Recent Labs  Lab 08/06/20 0205 08/06/20 0415 08/06/20 0715 08/07/20 1057 08/08/20 0448  TROPONINIHS 863*  1,190* 1,186* 504* 440*      Chemistry Recent Labs  Lab 08/07/20 0406 08/08/20 0448 08/08/20 1245 08/09/20 0422 08/10/20 0556  NA 139 136  --  134*  --   K 3.8 3.7  --  4.1 3.9  CL 100 97*  --  95*  --   CO2 29 30  --  30  --   GLUCOSE 95 112*  --  110*  --   BUN 12 15  --  17  --   CREATININE 1.26* 1.28* 1.16 1.23  --   CALCIUM 9.0 8.7*  --  9.3  --   PROT 6.1* 6.4*  --  6.3*  --   ALBUMIN 3.2* 3.4*  --  3.4*  --   AST 18 17  --  16  --   ALT 8 9  --  9  --   ALKPHOS 74 69  --  69  --   BILITOT 2.0* 1.8*  --  1.9*  --   GFRNONAA >60 >60 >60 >60  --   ANIONGAP 10 9  --  9  --      Hematology Recent Labs  Lab 08/07/20 0406 08/08/20 0448  08/09/20 0422  WBC 7.1 6.8 7.1  RBC 4.29 4.36 4.43  HGB 12.0* 12.4* 12.6*  HCT 37.2* 37.4* 37.5*  MCV 86.7 85.8 84.7  MCH 28.0 28.4 28.4  MCHC 32.3 33.2 33.6  RDW 21.1* 20.5* 19.9*  PLT 123* 121* 124*    BNP Recent Labs  Lab 08/05/20 1844 08/07/20 1057 08/08/20 0448  BNP 1,069.1* 1,042.9* 1,395.9*     DDimer No results for input(s): DDIMER in the last 168 hours.   Radiology    MR FOOT LEFT W WO CONTRAST  Result Date: 08/10/2020 CLINICAL DATA:  Swelling of the second toe, question of osteomyelitis EXAM: MRI OF THE LEFT FOREFOOT WITHOUT AND WITH CONTRAST TECHNIQUE: Multiplanar, multisequence MR imaging of the left was performed both before and after administration of intravenous contrast. CONTRAST:  1m GADAVIST GADOBUTROL 1 MMOL/ML IV SOLN COMPARISON:  None. FINDINGS: Bones/Joint/Cartilage No fracture or dislocation. No areas of cortical destruction or periosteal reaction. There is mildly increased T2 hyperintense signal seen at the distal second phalanx with minimal enhancement, and minimal T1 hypointensity at the distal tuft. 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 No large joint effusions are noted. Ligaments The Lisfranc ligaments are intact. Muscles and Tendons Mildly increased signal is seen within the muscles of the forefoot. The flexor and extensor tendons are intact. The plantar fascia is intact. Soft tissues Mild soft tissue swelling and subcutaneous edema seen surrounding the distal phalanx of the second digit. There is nailbed irregularity seen at the second digit. No loculated fluid collections are identified. IMPRESSION: Findings suggestive of cellulitis with nailbed irregularity seen at the distal second phalanx. There is findings that could be suggestive of early osteomyelitis involving the tip of the distal tuft. Reactive marrow seen throughout the distal second phalanx however. No soft tissue abscess seen. Electronically Signed    By: BPrudencio PairM.D.   On: 08/10/2020 00:38    Cardiac Studies   Echocardiogram January 2022  1. Left ventricular ejection fraction, by estimation, is <20%. Left  ventricular ejection fraction by PLAX is 14 %. The left ventricle has  severely decreased function. The left ventricle demonstrates global  hypokinesis. The left ventricular internal  cavity size was severely dilated. Left ventricular diastolic parameters  are indeterminate.  2. Right ventricular systolic function is severely reduced. The right  ventricular size is moderately enlarged. There is severely elevated  pulmonary artery systolic pressure. The estimated right ventricular  systolic pressure is [id number] mmHg.  3. Left atrial size was severely dilated.  4. Right atrial size was severely dilated.  5. Tricuspid valve regurgitation is mild to moderate.  6. The inferior vena cava is dilated in size with <50% respiratory  variability, suggesting right atrial pressure of 15 mmHg.   Patient Profile     61 y.o. male CAD/PCI, multivessel CAD, ischemic cardiomyopathy, EF less than 20%, PAD/SFA stent, right BKA presenting with lower extremity cellulitis, diagnosed with NSTEMI.   Assessment & Plan    1.  NSTEMI, history of multivessel CAD For med noncompliance, Previous hospital admissions leaving [place] not to be a candidate for intervention including CABG Has preferred medical management ---On Eliquis 5 twice daily for atrial fibrillation, Plavix, Lipitor, Imdur Currently pain-free.  No further ischemic work-up  2.  Permanent atrial fibrillation -Heart rate controlled Continue carvedilol, digoxin, Eliquis as above  3.  Ischemic cardiomyopathy, EF 20% Continue losartan 12.5 mg daily, Imdur, Currently on Lasix IV daily, At the time of discharge will transition over to torsemide 40 daily (was taking this as outpatient), extra torsemide in the p.m. for 3 pound weight gain  4.  Cellulitis left foot Plan for  amputation second digit tomorrow Discussed details with him  5.  COPD Continues to smoke Girlfriend also smokes Complete cessation recommended  6.  PAD Significant lower extremity arterial disease, smoking cessation recommended Plavix statin  Preop cardiovascular Scheduled for surgery tomorrow, Moderate to high risk given severe cardiac disease and PAD in the setting of demand ischemia -No further ischemic work-up needed, would moderate IV fluids perioperatively   Continue CHF education with him, he was not titrating his torsemide based on his weight at home Presenting with acute CHF, pulmonary edema, respiratory distress -Recommend he moderate his fluid intake, daily weights, add additional torsemide in the afternoon for 3 pound weight gain  Total encounter time more than 25 minutes  Greater than 50% was spent in counseling and coordination of care with the patient   For questions or updates, please contact [place] Please consult [email or web address] for contact info under        Signed, Ida Rogue, MD  08/10/2020, 2:52 PM

## 2020-08-10 NOTE — Progress Notes (Signed)
   08/10/20 0532  Vitals  Temp 98.1 F (36.7 C)  BP 130/67  MAP (mmHg) 82  BP Location Right Leg  BP Method Automatic  Patient Position (if appropriate) Lying  Pulse Rate (!) 59  Resp 16  CCMD called at 5:25am. Patient had 5 beat run PVC. checked patient , VS stable. Denies any chest pain or any symptoms. MD notified.

## 2020-08-11 ENCOUNTER — Encounter: Payer: Self-pay | Admitting: Internal Medicine

## 2020-08-11 ENCOUNTER — Inpatient Hospital Stay: Payer: Medicare Other | Admitting: Anesthesiology

## 2020-08-11 ENCOUNTER — Encounter
Admission: EM | Disposition: A | Discharge status: Home Health | Payer: Self-pay | Source: Home / Self Care | Attending: Internal Medicine

## 2020-08-11 HISTORY — PX: AMPUTATION TOE: SHX6595

## 2020-08-11 LAB — CBC
HCT: 36.5 % — ABNORMAL LOW (ref 39.0–52.0)
Hemoglobin: 12.3 g/dL — ABNORMAL LOW (ref 13.0–17.0)
MCH: 28.1 pg (ref 26.0–34.0)
MCHC: 33.7 g/dL (ref 30.0–36.0)
MCV: 83.3 fL (ref 80.0–100.0)
Platelets: 132 10*3/uL — ABNORMAL LOW (ref 150–400)
RBC: 4.38 MIL/uL (ref 4.22–5.81)
RDW: 19.4 % — ABNORMAL HIGH (ref 11.5–15.5)
WBC: 7.1 10*3/uL (ref 4.0–10.5)
nRBC: 0 % (ref 0.0–0.2)

## 2020-08-11 LAB — BASIC METABOLIC PANEL
Anion gap: 7 (ref 5–15)
BUN: 24 mg/dL — ABNORMAL HIGH (ref 6–20)
CO2: 28 mmol/L (ref 22–32)
Calcium: 9.1 mg/dL (ref 8.9–10.3)
Chloride: 98 mmol/L (ref 98–111)
Creatinine, Ser: 1.17 mg/dL (ref 0.61–1.24)
GFR, Estimated: >60 mL/min (ref >60)
Glucose, Bld: 147 mg/dL — ABNORMAL HIGH (ref 70–99)
Potassium: 3.9 mmol/L (ref 3.5–5.1)
Sodium: 133 mmol/L — ABNORMAL LOW (ref 135–145)

## 2020-08-11 LAB — DIGOXIN LEVEL: Digoxin Level: 0.7 ng/mL — ABNORMAL LOW (ref 0.8–2.0)

## 2020-08-11 LAB — GLUCOSE, CAPILLARY
Glucose-Capillary: 124 mg/dL — ABNORMAL HIGH (ref 70–99)
Glucose-Capillary: 101 mg/dL — ABNORMAL HIGH (ref 70–99)
Glucose-Capillary: 119 mg/dL — ABNORMAL HIGH (ref 70–99)
Glucose-Capillary: 170 mg/dL — ABNORMAL HIGH (ref 70–99)
Glucose-Capillary: 118 mg/dL — ABNORMAL HIGH (ref 70–99)

## 2020-08-11 LAB — VANCOMYCIN, TROUGH: Vancomycin Tr: 21 ug/mL (ref 15–20)

## 2020-08-11 SURGERY — AMPUTATION, TOE
Anesthesia: Monitor Anesthesia Care | Site: Toe | Laterality: Left

## 2020-08-11 MED ORDER — LIDOCAINE HCL (PF) 1 % IJ SOLN
INTRAMUSCULAR | Status: DC | PRN
  Administered 2020-08-11: 5 mL

## 2020-08-11 MED ORDER — VANCOMYCIN HCL IN DEXTROSE 1-5 GM/200ML-% IV SOLN
1000.0000 mg | INTRAVENOUS | Status: DC
  Administered 2020-08-11: 1000 mg via INTRAVENOUS
  Filled 2020-08-11 (×3): qty 200

## 2020-08-11 MED ORDER — FENTANYL CITRATE (PF) 100 MCG/2ML IJ SOLN: 25.0000 ug | INTRAMUSCULAR | Status: DC | PRN

## 2020-08-11 MED ORDER — OXYCODONE HCL 5 MG PO TABS: 5.0000 mg | ORAL_TABLET | Freq: Once | ORAL | Status: DC | PRN

## 2020-08-11 MED ORDER — TORSEMIDE 20 MG PO TABS
20.0000 mg | ORAL_TABLET | Freq: Every day | ORAL | Status: DC
  Filled 2020-08-11: qty 1

## 2020-08-11 MED ORDER — HYDROMORPHONE HCL 1 MG/ML IJ SOLN
1.0000 mg | Freq: Once | INTRAMUSCULAR | Status: AC
  Administered 2020-08-11: 1 mg via INTRAVENOUS
  Filled 2020-08-11: qty 1

## 2020-08-11 MED ORDER — FENTANYL CITRATE (PF) 100 MCG/2ML IJ SOLN
INTRAMUSCULAR | Status: DC | PRN
  Administered 2020-08-11 (×2): 50 ug via INTRAVENOUS

## 2020-08-11 MED ORDER — ONDANSETRON HCL 4 MG/2ML IJ SOLN: 4.0000 mg | Freq: Once | INTRAMUSCULAR | Status: DC | PRN

## 2020-08-11 MED ORDER — SODIUM CHLORIDE 0.9 % IV SOLN: INTRAVENOUS | Status: DC | PRN

## 2020-08-11 MED ORDER — TORSEMIDE 20 MG PO TABS
40.0000 mg | ORAL_TABLET | Freq: Every day | ORAL | Status: DC
  Administered 2020-08-12: 40 mg via ORAL
  Filled 2020-08-11: qty 2

## 2020-08-11 MED ORDER — MIDAZOLAM HCL 2 MG/2ML IJ SOLN
INTRAMUSCULAR | Status: DC | PRN
  Administered 2020-08-11 (×2): 1 mg via INTRAVENOUS

## 2020-08-11 MED ORDER — HYDROMORPHONE HCL 1 MG/ML IJ SOLN
1.0000 mg | Freq: Once | INTRAMUSCULAR | Status: AC
Start: 2020-08-11 — End: 2020-08-11
  Administered 2020-08-11: 1 mg via INTRAVENOUS
  Filled 2020-08-11: qty 1

## 2020-08-11 MED ORDER — ONDANSETRON HCL 4 MG/2ML IJ SOLN
INTRAMUSCULAR | Status: DC | PRN
  Administered 2020-08-11: 4 mg via INTRAVENOUS

## 2020-08-11 MED ORDER — OXYCODONE HCL 5 MG/5ML PO SOLN: 5.0000 mg | Freq: Once | ORAL | Status: DC | PRN

## 2020-08-11 MED ORDER — FENTANYL CITRATE (PF) 100 MCG/2ML IJ SOLN
INTRAMUSCULAR | Status: AC
  Filled 2020-08-11: qty 2

## 2020-08-11 MED ORDER — BUPIVACAINE HCL 0.5 % IJ SOLN
INTRAMUSCULAR | Status: DC | PRN
  Administered 2020-08-11: 5 mL

## 2020-08-11 MED ORDER — PROPOFOL 500 MG/50ML IV EMUL
INTRAVENOUS | Status: AC
  Filled 2020-08-11: qty 50

## 2020-08-11 MED ORDER — APIXABAN 5 MG PO TABS: 5.0000 mg | ORAL_TABLET | Freq: Two times a day (BID) | ORAL | Status: DC

## 2020-08-11 MED ORDER — PROPOFOL 10 MG/ML IV BOLUS
INTRAVENOUS | Status: AC
  Filled 2020-08-11: qty 20

## 2020-08-11 MED ORDER — MIDAZOLAM HCL 2 MG/2ML IJ SOLN
INTRAMUSCULAR | Status: AC
  Filled 2020-08-11: qty 2

## 2020-08-11 MED ORDER — PIPERACILLIN-TAZOBACTAM 3.375 G IVPB
INTRAVENOUS | Status: AC
  Filled 2020-08-11: qty 50

## 2020-08-11 MED ORDER — BUPIVACAINE HCL (PF) 0.5 % IJ SOLN
INTRAMUSCULAR | Status: AC
  Filled 2020-08-11: qty 30

## 2020-08-11 MED ORDER — LIDOCAINE HCL (PF) 1 % IJ SOLN
INTRAMUSCULAR | Status: AC
  Filled 2020-08-11: qty 30

## 2020-08-11 MED ORDER — APIXABAN 5 MG PO TABS
5.0000 mg | ORAL_TABLET | Freq: Two times a day (BID) | ORAL | Status: DC
  Administered 2020-08-11 – 2020-08-12 (×2): 5 mg via ORAL
  Filled 2020-08-11 (×2): qty 1

## 2020-08-11 SURGICAL SUPPLY — 44 items
BLADE OSC/SAGITTAL MD 5.5X18 (BLADE) ×2 IMPLANT
BLADE SURG MINI STRL (BLADE) ×2 IMPLANT
BNDG COHESIVE 4X5 TAN STRL (GAUZE/BANDAGES/DRESSINGS) ×2 IMPLANT
BNDG CONFORM 2 STRL LF (GAUZE/BANDAGES/DRESSINGS) ×2 IMPLANT
BNDG CONFORM 3 STRL LF (GAUZE/BANDAGES/DRESSINGS) ×4 IMPLANT
BNDG ELASTIC 4X5.8 VLCR NS LF (GAUZE/BANDAGES/DRESSINGS) ×2 IMPLANT
BNDG ESMARK 4X12 TAN STRL LF (GAUZE/BANDAGES/DRESSINGS) ×2 IMPLANT
BNDG GAUZE 4.5X4.1 6PLY STRL (MISCELLANEOUS) ×2 IMPLANT
COVER WAND RF STERILE (DRAPES) ×2 IMPLANT
CUFF TOURN SGL QUICK 12 (TOURNIQUET CUFF) ×2 IMPLANT
CUFF TOURN SGL QUICK 18X4 (TOURNIQUET CUFF) IMPLANT
DRAPE FLUOR MINI C-ARM 54X84 (DRAPES) ×2 IMPLANT
DRAPE XRAY CASSETTE 23X24 (DRAPES) ×2 IMPLANT
DURAPREP 26ML APPLICATOR (WOUND CARE) ×2 IMPLANT
ELECT REM PT RETURN 9FT ADLT (ELECTROSURGICAL) ×2
ELECTRODE REM PT RTRN 9FT ADLT (ELECTROSURGICAL) ×1 IMPLANT
GAUZE PACKING IODOFORM 1/2 (PACKING) ×2 IMPLANT
GAUZE SPONGE 4X4 12PLY STRL (GAUZE/BANDAGES/DRESSINGS) ×2 IMPLANT
GAUZE XEROFORM 1X8 LF (GAUZE/BANDAGES/DRESSINGS) ×2 IMPLANT
GLOVE INDICATOR 8.0 STRL GRN (GLOVE) ×2 IMPLANT
GLOVE SURG ENC MOIS LTX SZ7.5 (GLOVE) ×2 IMPLANT
GOWN STRL REUS W/ TWL XL LVL3 (GOWN DISPOSABLE) ×2 IMPLANT
GOWN STRL REUS W/TWL XL LVL3 (GOWN DISPOSABLE) ×2
KIT TURNOVER KIT A (KITS) ×2 IMPLANT
LABEL OR SOLS (LABEL) ×2 IMPLANT
MANIFOLD NEPTUNE II (INSTRUMENTS) ×2 IMPLANT
NEEDLE FILTER BLUNT 18X 1/2SAF (NEEDLE) ×1
NEEDLE FILTER BLUNT 18X1 1/2 (NEEDLE) ×1 IMPLANT
NEEDLE HYPO 25X1 1.5 SAFETY (NEEDLE) ×2 IMPLANT
NS IRRIG 500ML POUR BTL (IV SOLUTION) ×2 IMPLANT
PACK EXTREMITY ARMC (MISCELLANEOUS) ×2 IMPLANT
PAD ABD DERMACEA PRESS 5X9 (GAUZE/BANDAGES/DRESSINGS) ×4 IMPLANT
PULSAVAC PLUS IRRIG FAN TIP (DISPOSABLE)
SHIELD FULL FACE ANTIFOG 7M (MISCELLANEOUS) ×2 IMPLANT
SOL .9 NS 3000ML IRR  AL (IV SOLUTION) ×1
SOL .9 NS 3000ML IRR UROMATIC (IV SOLUTION) ×1 IMPLANT
STOCKINETTE M/LG 89821 (MISCELLANEOUS) ×2 IMPLANT
STRAP SAFETY 5IN WIDE (MISCELLANEOUS) ×2 IMPLANT
SUT ETHILON 3-0 FS-10 30 BLK (SUTURE) ×2
SUT ETHILON 5-0 FS-2 18 BLK (SUTURE) ×2 IMPLANT
SUT VIC AB 4-0 FS2 27 (SUTURE) ×2 IMPLANT
SUTURE EHLN 3-0 FS-10 30 BLK (SUTURE) ×1 IMPLANT
SYR 10ML LL (SYRINGE) ×6 IMPLANT
TIP FAN IRRIG PULSAVAC PLUS (DISPOSABLE) IMPLANT

## 2020-08-11 NOTE — Progress Notes (Signed)
Patient ID: [name] [name], male   DOB: [birth year], 60 y.o.   MRN: [id number] [facility] PROGRESS NOTE  MAHONRI MILHOUSE [id number] DOB: Oct 23, 1960 DOA: 08/05/2020 PCP: [name], MD  HPI/Subjective: Patient seen this morning.  He offers no complaints.  No chest pain or shortness of breath.  Left second toe this morning was numb.  Admitted with cellulitis and found to have osteomyelitis of the left second toe  Objective: Vitals:   08/11/20 1145 08/11/20 1316  BP: 112/69 115/68  Pulse: 62 (!) 54  Resp: 16 17  Temp: 97.9 F (36.6 C) 98.7 F (37.1 C)  SpO2: 97% 100%    Intake/Output Summary (Last 24 hours) at 08/11/2020 1457 Last data filed at 08/11/2020 1419 Gross per 24 hour  Intake 240 ml  Output 2252 ml  Net -2012 ml   Filed Weights   08/09/20 0517 08/10/20 0453 08/11/20 0500  Weight: 83.8 kg 84.5 kg 81.7 kg    ROS: Review of Systems  Respiratory: Negative for shortness of breath.   Cardiovascular: Negative for chest pain.  Gastrointestinal: Negative for abdominal pain, nausea and vomiting.   Exam: Physical Exam HENT:     Head: Normocephalic.     Mouth/Throat:     Pharynx: No oropharyngeal exudate.  Eyes:     General: Lids are normal.     Conjunctiva/sclera: Conjunctivae normal.     Pupils: Pupils are equal, round, and reactive to light.  Cardiovascular:     Rate and Rhythm: Normal rate and regular rhythm.     Heart sounds: Normal heart sounds, S1 normal and S2 normal.  Pulmonary:     Breath sounds: Normal breath sounds. No decreased breath sounds, wheezing, rhonchi or rales.  Abdominal:     Palpations: Abdomen is soft.     Tenderness: There is no abdominal tenderness.     Hernia: A hernia is present. Hernia is present in the ventral area.  Musculoskeletal:     Left lower leg: No swelling.  Skin:    General: Skin is warm.     Comments: This morning left second toe was swollen and slight red.  Chronic lower extremity skin discoloration left leg   Neurological:     Mental Status: He is alert and oriented to person, place, and time.       Data Reviewed: Basic Metabolic Panel: Recent Labs  Lab 08/05/20 1841 08/06/20 0403 08/07/20 0406 08/08/20 0448 08/08/20 1245 08/09/20 0422 08/10/20 0556 08/11/20 0521  NA 135 135 139 136  --  134*  --  133*  K 4.0 4.2 3.8 3.7  --  4.1 3.9 3.9  CL 92* 96* 100 97*  --  95*  --  98  CO2 31 32 29 30  --  30  --  28  GLUCOSE 185* 97 95 112*  --  110*  --  147*  BUN '10 10 12 15  '$ --  17  --  24*  CREATININE 1.19 1.09 1.26* 1.28* 1.16 1.23  --  1.17  CALCIUM 9.1 8.8* 9.0 8.7*  --  9.3  --  9.1  MG 1.9  --  2.1  --   --  2.1 1.9  --    Liver Function Tests: Recent Labs  Lab 08/05/20 1841 08/06/20 0403 08/06/20 0715 08/07/20 0406 08/08/20 0448 08/09/20 0422  AST 17 33  --  '18 17 16  '$ ALT 9 9  --  '8 9 9  '$ ALKPHOS 92 78  --  74  69 69  BILITOT 1.6* 1.6* 1.6* 2.0* 1.8* 1.9*  PROT 6.7 6.2*  --  6.1* 6.4* 6.3*  ALBUMIN 3.6 3.1*  --  3.2* 3.4* 3.4*   CBC: Recent Labs  Lab 08/05/20 1841 08/06/20 0403 08/07/20 0406 08/08/20 0448 08/09/20 0422 08/11/20 0521  WBC 7.5 6.4 7.1 6.8 7.1 7.1  NEUTROABS 6.1  --   --   --   --   --   HGB 12.4* 11.4* 12.0* 12.4* 12.6* 12.3*  HCT 38.7* 35.1* 37.2* 37.4* 37.5* 36.5*  MCV 86.4 86.5 86.7 85.8 84.7 83.3  PLT 143* 132* 123* 121* 124* 132*   BNP (last 3 results) Recent Labs    08/05/20 1844 08/07/20 1057 08/08/20 0448  BNP 1,069.1* 1,042.9* 1,395.9*    CBG: Recent Labs  Lab 08/10/20 1644 08/10/20 2250 08/11/20 0737 08/11/20 0912 08/11/20 1145  GLUCAP 161* 142* 124* 101* 119*    Recent Results (from the past 240 hour(s))  SARS CORONAVIRUS 2 (TAT 6-24 HRS) Nasopharyngeal Nasopharyngeal Swab     Status: None   Collection Time: 08/06/20  4:11 AM   Specimen: Nasopharyngeal Swab  Result Value Ref Range Status   SARS Coronavirus 2 NEGATIVE NEGATIVE Final    Comment: (NOTE) SARS-CoV-2 target nucleic acids are NOT DETECTED.  The  SARS-CoV-2 RNA is generally detectable in upper and lower respiratory specimens during the acute phase of infection. Negative results do not preclude SARS-CoV-2 infection, do not rule out co-infections with other pathogens, and should not be used as the sole basis for treatment or other patient management decisions. Negative results must be combined with clinical observations, patient history, and epidemiological information. The expected result is Negative.  Fact Sheet for Patients: SugarRoll.be  Fact Sheet for Healthcare Providers: https://www.woods-mathews.com/  This test is not yet approved or cleared by the [place] FDA and  has been authorized for detection and/or diagnosis of SARS-CoV-2 by FDA under an Emergency Use Authorization (EUA). This EUA will remain  in effect (meaning this test can be used) for the duration of the COVID-19 declaration under Se ction 564(b)(1) of the Act, 21 U.S.C. section 360bbb-3(b)(1), unless the authorization is terminated or revoked sooner.  Performed at [facility], [place] [address], [place], [place] [address]      Studies: MR FOOT LEFT W WO CONTRAST  Result Date: 08/10/2020 CLINICAL DATA:  Swelling of the second toe, question of osteomyelitis EXAM: MRI OF THE LEFT FOREFOOT WITHOUT AND WITH CONTRAST TECHNIQUE: Multiplanar, multisequence MR imaging of the left was performed both before and after administration of intravenous contrast. CONTRAST:  63m GADAVIST GADOBUTROL 1 MMOL/ML IV SOLN COMPARISON:  None. FINDINGS: Bones/Joint/Cartilage No fracture or dislocation. No areas of cortical destruction or periosteal reaction. There is mildly increased T2 hyperintense signal seen at the distal second phalanx with minimal enhancement, and minimal T1 hypointensity at the distal tuft. 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 No large joint effusions are noted.  Ligaments The Lisfranc ligaments are intact. Muscles and Tendons Mildly increased signal is seen within the muscles of the forefoot. The flexor and extensor tendons are intact. The plantar fascia is intact. Soft tissues Mild soft tissue swelling and subcutaneous edema seen surrounding the distal phalanx of the second digit. There is nailbed irregularity seen at the second digit. No loculated fluid collections are identified. IMPRESSION: Findings suggestive of cellulitis with nailbed irregularity seen at the distal second phalanx. There is findings that could be suggestive of early osteomyelitis  involving the tip of the distal tuft. Reactive marrow seen throughout the distal second phalanx however. No soft tissue abscess seen. Electronically Signed   By: Prudencio Pair M.D.   On: 08/10/2020 00:38    Scheduled Meds: . apixaban  5 mg Oral BID  . atorvastatin  80 mg Oral Daily  . carvedilol  3.125 mg Oral BID WC  . clopidogrel  75 mg Oral Daily  . digoxin  0.125 mg Oral Daily  . DULoxetine  30 mg Oral Daily  . ezetimibe  10 mg Oral Daily  . furosemide  40 mg Intravenous Daily  . gabapentin  800 mg Oral QID  . insulin aspart  0-15 Units Subcutaneous TID WC  . isosorbide mononitrate  30 mg Oral Daily  . losartan  12.5 mg Oral Daily  . mometasone-formoterol  2 puff Inhalation BID  . pantoprazole  40 mg Oral Daily  . potassium chloride  10 mEq Oral BID  . sodium chloride flush  3 mL Intravenous Q12H  . tamsulosin  0.4 mg Oral QPC supper  . traZODone  150 mg Oral QHS  . Vericiguat  5 mg Oral Daily   Continuous Infusions: . sodium chloride 250 mL (08/11/20 0024)  . piperacillin-tazobactam (ZOSYN)  IV 3.375 g (08/11/20 0026)  . vancomycin 1,000 mg (08/11/20 1431)    Assessment/Plan:  1. Osteomyelitis of the left second toe with diabetic foot infection and cellulitis.  Podiatry removed left second toe today.  Continue IV antibiotics for now.  Hopefully will be able to go over to oral antibiotics  upon disposition. 2. Recent NSTEMI.  Medical management.  Continue Coreg, atorvastatin and Plavix. 3. Acute on chronic systolic congestive failure.  Continue IV Lasix, Coreg, digoxin, Imdur and losartan.  Likely will switch back to oral torsemide tomorrow. 4. Type 2 diabetes mellitus with hyperlipidemia.  Hemoglobin A1c very good at 6.5.  Diet controlled.  Continue atorvastatin. 5. Severe peripheral vascular disease.  Patient on Plavix and Eliquis 6. Chronic atrial fibrillation on Eliquis for anticoagulation and Coreg for rate control. 7. Diabetic neuropathy 8. Nonsustained ventricular tachycardia        Code Status:     Code Status Orders  (From admission, onward)         Start     Ordered   08/05/20 2333  Full code  Continuous        08/05/20 2335        Code Status History    Date Active Date Inactive Code Status Order ID Comments User Context   07/17/2020 1120 07/17/2020 2127 Full Code [id number]  Algernon Huxley, MD Inpatient   06/30/2020 1606 07/01/2020 1943 Full Code [id number]  Rolley Sims, RN Inpatient   06/19/2020 1028 06/30/2020 1606 [id number]  Pershing Proud, NP Inpatient   06/17/2020 0509 06/19/2020 1028 Full Code [id number]  Athena Masse, MD ED   01/19/2020 1139 01/21/2020 0030 Full Code [id number]  Ivor Costa, MD ED   09/27/2019 0427 09/30/2019 1952 Full Code [id number]  Athena Masse, MD ED   08/31/2019 0109 08/31/2019 0742 Full Code [id number]  Athena Masse, MD ED   08/02/2019 1147 08/02/2019 1835 Full Code [id number]  Algernon Huxley, MD Inpatient   06/14/2019 1103 06/14/2019 1851 Full Code [id number]  Algernon Huxley, MD Inpatient   11/06/2018 0023 11/09/2018 1816 Full Code [id number]  Lance Coon, MD ED   08/24/2018 0939 08/24/2018 1444 Full Code [id number]  Dew, Erskine Squibb, MD Inpatient  07/27/2018 2248 07/28/2018 1836 Full Code [id number]  Vaughan Basta, MD Inpatient   03/30/2018 1839 04/07/2018 2116 Full Code [id number]  Nicholes Mango, MD Inpatient   03/03/2018 1044 03/03/2018  1545 Full Code [id number]  Delana Meyer Dolores Lory, MD Inpatient   01/03/2018 0409 01/09/2018 1723 Full Code [id number]  Lance Coon, MD Inpatient   12/22/2017 0607 12/23/2017 1916 Full Code [id number]  Harrie Foreman, MD Inpatient   11/21/2017 0415 11/23/2017 1444 Full Code [id number]  Lance Coon, MD Inpatient   04/23/2017 0355 04/29/2017 1353 Full Code [id number]  Saundra Shelling, MD Inpatient   07/11/2016 0324 07/11/2016 1851 Full Code [id number]  Saundra Shelling, MD Inpatient   05/06/2016 0005 05/07/2016 1417 Full Code [id number]  Harvie Bridge, DO Inpatient   11/21/2015 1716 11/22/2015 1950 Full Code [id number]  Gladstone Lighter, MD Inpatient   07/06/2015 0948 07/06/2015 1437 Full Code [id number]  Algernon Huxley, MD Inpatient   Advance Care Planning Activity     Family Communication: Tried calling significant other but the voicemail box is full Disposition Plan: Status is: Inpatient  Dispo: The patient is from: Home              Anticipated d/c is to: Home likely in a few days              Patient currently went to the operating room today for amputation of the left second toe for osteomyelitis   Difficult to place patient.  No.  Consultants:  Cardiology  Podiatry  Procedures:  Amputation left second toe  Antibiotics:  Vancomycin  Zosyn  Time spent: 27 minutes  [place]

## 2020-08-11 NOTE — Progress Notes (Addendum)
  Pharmacy Antibiotic Note  [name] [name] is a 60 y.o. male admitted on 08/05/2020 with cellulitis.  Pharmacy has been consulted for Zosyn and Vancomycin dosing. 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  Plan:  1) continue Zosyn 3.375 gm EI every 8 hours   2) Pt is currently on vancomycin 1500 mg q24H. Vanc peak 40 and vanc trough 21. Will adjust vancomycin dose to 1000 mg q24H. Last dose of vancomycin 3/3 3/3 '@00'$ :15 - Will start next dose 36 hours from last dose per PK calculations. Goal AUC is 400-550. Calculated AUC for 1000 mg q24H is 486. Continue to monitor Scr and f/u with duration of abx.   Plan for amputation 3/4. Pt received apixaban 3/3.    Height: '5\' 7"'$  (170.2 cm) Weight: 81.7 kg (180 lb 1.6 oz) IBW/kg (Calculated) : 66.1  Temp (24hrs), Avg:98.2 F (36.8 C), Min:97.4 F (36.3 C), Max:98.7 F (37.1 C)  Recent Labs  Lab 08/05/20 1841 08/05/20 2239 08/06/20 0403 08/07/20 0406 08/08/20 0448 08/08/20 1245 08/09/20 0422 08/10/20 0359 08/10/20 2319 08/11/20 0521  WBC 7.5  --  6.4 7.1 6.8  --  7.1  --   --  7.1  CREATININE 1.19  --  1.09 1.26* 1.28* 1.16 1.23  --   --  1.17  LATICACIDVEN 1.3 1.9  --   --   --   --   --   --   --   --   VANCOTROUGH  --   --   --   --   --   --   --   --  21*  --   VANCOPEAK  --   --   --   --   --   --   --  40  --   --     Estimated Creatinine Clearance: 71.2 mL/min (by C-G formula based on SCr of 1.17 mg/dL).    Allergies  Allergen Reactions  . Dulaglutide Anaphylaxis, Diarrhea and Hives  . Ace Inhibitors     hypotension  . Other Itching    Skin itching associated with nitro patch  . Prednisone Rash    Antimicrobials this admission: 02/26 Zosyn >>  02/27 Vancomycin >>   Microbiology results: 02/27 SARS CoV-2: negative  Thank you for allowing pharmacy to be a part of this patient's care.  Eleonore Chiquito, PharmD 08/11/2020 9:02 AM

## 2020-08-11 NOTE — Progress Notes (Signed)
PT Cancellation Note  Patient Details Name: [name] [name] MRN: [id number] DOB: [birth year]   Cancelled Treatment:    Reason Eval/Treat Not Completed: Fatigue/lethargy limiting ability to participate   Pt to OR this am for toe amp.  Offered session this pm but pt generally fatigued stating his R LE is "dead to the world".  Declined session today but stated he would participate tomorrow.  Orders for post op shoe noted.   Chesley Noon 08/11/2020, 3:44 PM

## 2020-08-11 NOTE — TOC Progression Note (Signed)
Transition of Care [facility]) - Progression Note    Patient Details  Name: [name] [name] MRN: [id number] Date of Birth: September 01, 1960  Transition of Care [facility]) CM/SW [place], RN Phone Number: 08/11/2020, 11:39 AM  Clinical Narrative:   TOC following, notified [facility] of toe amputation today in advance of discharge.  Joelene Millin stated she will get orders ready to prepare for patient discharge. (No confirmed date as of yet).  TOC will follow until discharge.    Expected Discharge Plan: [place] Barriers to Discharge: No Barriers Identified  Expected Discharge Plan and Services Expected Discharge Plan: [place] Choice: Resumption of Svcs/PTA Provider Living arrangements for the past 2 months: Single Family Home                                       Social Determinants of Health (SDOH) Interventions    Readmission Risk Interventions Readmission Risk Prevention Plan 08/07/2020 06/30/2020 11/09/2018  Transportation Screening Complete Complete -  HRI or [place] Work Consult for [place] Planning/Counseling - - Not Complete  SW consult not completed comments - - na  Palliative Care Screening - - Not Applicable  Medication Review Press photographer) Complete Complete -  PCP or Specialist appointment within 3-5 days of discharge Complete Complete -  [place] or Home Care Consult Complete Complete -  SW Recovery Care/Counseling Consult Complete Complete -  Palliative Care Screening Not Applicable Not Applicable -  [place] Not Applicable Not Applicable -  Some recent data might be hidden

## 2020-08-11 NOTE — Transfer of Care (Signed)
Immediate Anesthesia Transfer of Care Note  Patient: [name] [name]  Procedure(s) Performed: AMPUTATION TOE-2nd Toe (Left Toe)  Patient Location: PACU  Anesthesia Type:MAC  Level of Consciousness: awake  Airway & Oxygen Therapy: Patient Spontanous Breathing and Patient connected to face mask oxygen  Post-op Assessment: Report given to RN  Post vital signs: stable  Last Vitals:  Vitals Value Taken Time  BP 110/62 08/11/20 0911  Temp    Pulse 50 08/11/20 0913  Resp 13 08/11/20 0913  SpO2 99 % 08/11/20 0913  Vitals shown include unvalidated device data.  Last Pain:  Vitals:   08/11/20 0758  TempSrc: Oral  PainSc: 8       Patients Stated Pain Goal: 0 (74/71/85 5015)  Complications: No complications documented.

## 2020-08-11 NOTE — Op Note (Signed)
Operative note   Surgeon:Jeison Delpilar Lawyer: None    Preop diagnosis: Osteomyelitis second toe left foot    Postop diagnosis: Same    Procedure: Amputation left second MTPJ left foot    EBL: Minimal    Anesthesia:local and IV sedation    Hemostasis: None    Specimen: Amputated second toe left foot    Complications: None    Operative indications:[name] [name] is an 60 y.o. that presents today for surgical intervention.  The risks/benefits/alternatives/complications have been discussed and consent has been given.    Procedure:  Patient was brought into the OR and placed on the operating table in thesupine position. After anesthesia was obtained theleft lower extremity was prepped and draped in usual sterile fashion.  Attention was directed to the left foot where 2 semielliptical incisions were made medial and lateral around the base of the second toe.  Full-thickness flaps were created and the toe was disarticulated at the metatarsophalangeal joint.  The wound was flushed with copious amounts of irrigation.  Good bleeding was noted to the surgical site.  Closure was performed with a 4-0 Vicryl for the subcutaneous tissue and a 3-0 nylon for skin.  A bulky sterile dressing was applied.    Patient tolerated the procedure and anesthesia well.  Was transported from the OR to the PACU with all vital signs stable and vascular status intact. To be discharged per routine protocol.  Will follow up in approximately 1 week in the outpatient clinic.

## 2020-08-11 NOTE — Care Management Important Message (Signed)
Important Message  Patient Details  Name: GABRIEL RENDINA MRN: [id number] Date of Birth: [birth year]   Medicare Important Message Given:  Yes     Pete Pelt, RN 08/11/2020, 3:24 PM

## 2020-08-11 NOTE — Progress Notes (Signed)
Progress Note  Patient Name: [name] [name] Date of Encounter: 08/11/2020  [facility] Cardiologist: Ida Rogue, MD   Subjective   Note indicating osteomyelitis second toe left foot Amputation of toe  today left foot second toe, no complications  Reports having some pain control issues Moderating his oral fluid intake   Inpatient Medications    Scheduled Meds: . apixaban  5 mg Oral BID  . atorvastatin  80 mg Oral Daily  . carvedilol  3.125 mg Oral BID WC  . clopidogrel  75 mg Oral Daily  . digoxin  0.125 mg Oral Daily  . DULoxetine  30 mg Oral Daily  . ezetimibe  10 mg Oral Daily  . furosemide  40 mg Intravenous Daily  . gabapentin  800 mg Oral QID  . insulin aspart  0-15 Units Subcutaneous TID WC  . isosorbide mononitrate  30 mg Oral Daily  . losartan  12.5 mg Oral Daily  . mometasone-formoterol  2 puff Inhalation BID  . pantoprazole  40 mg Oral Daily  . potassium chloride  10 mEq Oral BID  . sodium chloride flush  3 mL Intravenous Q12H  . tamsulosin  0.4 mg Oral QPC supper  . traZODone  150 mg Oral QHS  . Vericiguat  5 mg Oral Daily   Continuous Infusions: . sodium chloride 250 mL (08/11/20 0024)  . piperacillin-tazobactam (ZOSYN)  IV 3.375 g (08/11/20 0026)  . vancomycin 1,000 mg (08/11/20 1431)   PRN Meds: sodium chloride, acetaminophen **OR** [DISCONTINUED] acetaminophen, albuterol, nitroGLYCERIN, oxyCODONE, polyethylene glycol   Vital Signs    Vitals:   08/11/20 1034 08/11/20 1145 08/11/20 1316 08/11/20 1557  BP: 127/64 112/69 115/68 121/70  Pulse: (!) 59 62 (!) 54 69  Resp: '20 16 17 16  '$ Temp: 97.6 F (36.4 C) 97.9 F (36.6 C) 98.7 F (37.1 C) 98.2 F (36.8 C)  TempSrc:  Oral Oral Oral  SpO2: 99% 97% 100% 95%  Weight:      Height:        Intake/Output Summary (Last 24 hours) at 08/11/2020 1714 Last data filed at 08/11/2020 1500 Gross per 24 hour  Intake 240 ml  Output 2752 ml  Net -2512 ml   Last 3 Weights 08/11/2020 08/10/2020 08/09/2020   Weight (lbs) 180 lb 1.6 oz 186 lb 3.2 oz 184 lb 11.9 oz  Weight (kg) 81.693 kg 84.46 kg 83.8 kg  Some encounter information is confidential and restricted. Go to Review Flowsheets activity to see all data.      Telemetry    Atrial fibrillation- Personally Reviewed  ECG    - Personally Reviewed  Physical Exam  Constitutional:  oriented to person, place, and time. No distress.  HENT:  Head: Grossly normal Eyes:  no discharge. No scleral icterus.  Neck: No JVD, no carotid bruits  Cardiovascular: Irregularly irregular no murmurs appreciated Pulmonary/Chest: Clear to auscultation bilaterally, no wheezes or rails Abdominal: Soft.  no distension.  no tenderness.  Musculoskeletal: Normal range of motion Neurological:  normal muscle tone. Coordination normal. No atrophy Skin: Skin warm and dry Psychiatric: normal affect, pleasant   Labs    High Sensitivity Troponin:   Recent Labs  Lab 08/06/20 0205 08/06/20 0415 08/06/20 0715 08/07/20 1057 08/08/20 0448  TROPONINIHS 863* 1,190* 1,186* 504* 440*      Chemistry Recent Labs  Lab 08/07/20 0406 08/08/20 0448 08/08/20 1245 08/09/20 0422 08/10/20 0556 08/11/20 0521  NA 139 136  --  134*  --  133*  K  3.8 3.7  --  4.1 3.9 3.9  CL 100 97*  --  95*  --  98  CO2 29 30  --  30  --  28  GLUCOSE 95 112*  --  110*  --  147*  BUN 12 15  --  17  --  24*  CREATININE 1.26* 1.28* 1.16 1.23  --  1.17  CALCIUM 9.0 8.7*  --  9.3  --  9.1  PROT 6.1* 6.4*  --  6.3*  --   --   ALBUMIN 3.2* 3.4*  --  3.4*  --   --   AST 18 17  --  16  --   --   ALT 8 9  --  9  --   --   ALKPHOS 74 69  --  69  --   --   BILITOT 2.0* 1.8*  --  1.9*  --   --   GFRNONAA >60 >60 >60 >60  --  >60  ANIONGAP 10 9  --  9  --  7     Hematology Recent Labs  Lab 08/08/20 0448 08/09/20 0422 08/11/20 0521  WBC 6.8 7.1 7.1  RBC 4.36 4.43 4.38  HGB 12.4* 12.6* 12.3*  HCT 37.4* 37.5* 36.5*  MCV 85.8 84.7 83.3  MCH 28.4 28.4 28.1  MCHC 33.2 33.6 33.7  RDW  20.5* 19.9* 19.4*  PLT 121* 124* 132*    BNP Recent Labs  Lab 08/05/20 1844 08/07/20 1057 08/08/20 0448  BNP 1,069.1* 1,042.9* 1,395.9*     DDimer No results for input(s): DDIMER in the last 168 hours.   Radiology    MR FOOT LEFT W WO CONTRAST  Result Date: 08/10/2020 CLINICAL DATA:  Swelling of the second toe, question of osteomyelitis EXAM: MRI OF THE LEFT FOREFOOT WITHOUT AND WITH CONTRAST TECHNIQUE: Multiplanar, multisequence MR imaging of the left was performed both before and after administration of intravenous contrast. CONTRAST:  91m GADAVIST GADOBUTROL 1 MMOL/ML IV SOLN COMPARISON:  None. FINDINGS: Bones/Joint/Cartilage No fracture or dislocation. No areas of cortical destruction or periosteal reaction. There is mildly increased T2 hyperintense signal seen at the distal second phalanx with minimal enhancement, and minimal T1 hypointensity at the distal tuft. 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 No large joint effusions are noted. Ligaments The Lisfranc ligaments are intact. Muscles and Tendons Mildly increased signal is seen within the muscles of the forefoot. The flexor and extensor tendons are intact. The plantar fascia is intact. Soft tissues Mild soft tissue swelling and subcutaneous edema seen surrounding the distal phalanx of the second digit. There is nailbed irregularity seen at the second digit. No loculated fluid collections are identified. IMPRESSION: Findings suggestive of cellulitis with nailbed irregularity seen at the distal second phalanx. There is findings that could be suggestive of early osteomyelitis involving the tip of the distal tuft. Reactive marrow seen throughout the distal second phalanx however. No soft tissue abscess seen. Electronically Signed   By: BPrudencio PairM.D.   On: 08/10/2020 00:38    Cardiac Studies   Echocardiogram January 2022  1. Left ventricular ejection fraction, by estimation, is <20%. Left   ventricular ejection fraction by PLAX is 14 %. The left ventricle has  severely decreased function. The left ventricle demonstrates global  hypokinesis. The left ventricular internal  cavity size was severely dilated. Left ventricular diastolic parameters  are indeterminate.  2. Right ventricular systolic function is severely reduced. The right  ventricular size is moderately enlarged. There is severely elevated  pulmonary artery systolic pressure. The estimated right ventricular  systolic pressure is [id number] mmHg.  3. Left atrial size was severely dilated.  4. Right atrial size was severely dilated.  5. Tricuspid valve regurgitation is mild to moderate.  6. The inferior vena cava is dilated in size with <50% respiratory  variability, suggesting right atrial pressure of 15 mmHg.   Patient Profile     60 y.o. male CAD/PCI, multivessel CAD, ischemic cardiomyopathy, EF less than 20%, PAD/SFA stent, right BKA presenting with lower extremity cellulitis, diagnosed with NSTEMI.   Assessment & Plan    1.  NSTEMI, history of multivessel CAD Long history of medication noncompliance No further ischemic work-up needed at this time Denies any chest pain concerning for unstable angina --- Would restart Eliquis 5 twice daily for atrial fibrillation when approved by surgery Continue Plavix, Lipitor, Imdur, losartan, digoxin  2.  Permanent atrial fibrillation -Heart rate controlled Continue carvedilol, digoxin Restart of Eliquis when approved by surgical team  3.  Ischemic cardiomyopathy, EF 20% Continue losartan 12.5 mg daily, Imdur, digoxin Will transition to torsemide 40 daily extra torsemide in the p.m. for 3 pound weight gain  4.  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  COPD Continues to smoke Girlfriend also smokes Complete cessation recommended Discussed different strategies for smoking cessation  6.  PAD Significant lower extremity arterial  disease, smoking cessation recommended Plavix statin   Total encounter time more than 25 minutes  Greater than 50% was spent in counseling and coordination of care with the patient  [facility] will sign off.   Medication Recommendations: Torsemide 40 mg daily at discharge Extra 40 mg for 3 pound weight gain Other recommendations (labs, testing, etc): No further testing Follow up as an outpatient: Outpatient follow-up in 2 weeks time    For questions or updates, please contact [place] Please consult [email or web address] for contact info under        Signed, Ida Rogue, MD  08/11/2020, 5:14 PM

## 2020-08-12 LAB — BASIC METABOLIC PANEL
Anion gap: 7 (ref 5–15)
BUN: 29 mg/dL — ABNORMAL HIGH (ref 6–20)
CO2: 28 mmol/L (ref 22–32)
Calcium: 9.2 mg/dL (ref 8.9–10.3)
Chloride: 99 mmol/L (ref 98–111)
Creatinine, Ser: 1.23 mg/dL (ref 0.61–1.24)
GFR, Estimated: >60 mL/min (ref >60)
Glucose, Bld: 129 mg/dL — ABNORMAL HIGH (ref 70–99)
Potassium: 4.8 mmol/L (ref 3.5–5.1)
Sodium: 134 mmol/L — ABNORMAL LOW (ref 135–145)

## 2020-08-12 LAB — CBC
HCT: 36.9 % — ABNORMAL LOW (ref 39.0–52.0)
Hemoglobin: 12.5 g/dL — ABNORMAL LOW (ref 13.0–17.0)
MCH: 28.7 pg (ref 26.0–34.0)
MCHC: 33.9 g/dL (ref 30.0–36.0)
MCV: 84.8 fL (ref 80.0–100.0)
Platelets: 132 10*3/uL — ABNORMAL LOW (ref 150–400)
RBC: 4.35 MIL/uL (ref 4.22–5.81)
RDW: 19.4 % — ABNORMAL HIGH (ref 11.5–15.5)
WBC: 8.2 10*3/uL (ref 4.0–10.5)
nRBC: 0.4 % — ABNORMAL HIGH (ref 0.0–0.2)

## 2020-08-12 LAB — GLUCOSE, CAPILLARY: Glucose-Capillary: 108 mg/dL — ABNORMAL HIGH (ref 70–99)

## 2020-08-12 MED ORDER — POLYETHYLENE GLYCOL 3350 17 G PO PACK: 17.0000 g | PACK | Freq: Every day | ORAL | 0 refills | Status: AC | PRN

## 2020-08-12 MED ORDER — DOXYCYCLINE HYCLATE 100 MG PO TABS: 100.0000 mg | ORAL_TABLET | Freq: Two times a day (BID) | ORAL | 0 refills | Status: AC

## 2020-08-12 MED ORDER — DOXYCYCLINE HYCLATE 100 MG PO TABS: 100.0000 mg | ORAL_TABLET | Freq: Two times a day (BID) | ORAL | Status: DC

## 2020-08-12 MED ORDER — OXYCODONE HCL 5 MG PO TABS
10.0000 mg | ORAL_TABLET | ORAL | Status: DC | PRN
  Administered 2020-08-12: 10 mg via ORAL
  Filled 2020-08-12: qty 2

## 2020-08-12 MED ORDER — MORPHINE SULFATE (PF) 2 MG/ML IV SOLN: 2.0000 mg | INTRAVENOUS | Status: DC | PRN

## 2020-08-12 MED ORDER — LOSARTAN POTASSIUM 25 MG PO TABS: 12.5000 mg | ORAL_TABLET | Freq: Every day | ORAL | 5 refills | Status: DC

## 2020-08-12 MED ORDER — DIGOXIN 125 MCG PO TABS: 0.1250 mg | ORAL_TABLET | Freq: Every day | ORAL | 0 refills | Status: DC

## 2020-08-12 NOTE — Anesthesia Postprocedure Evaluation (Signed)
Anesthesia Post Note  Patient: [name] [name]  Procedure(s) Performed: AMPUTATION TOE-2nd Toe (Left Toe)  Patient location during evaluation: PACU Anesthesia Type: MAC Level of consciousness: awake and alert Pain management: pain level controlled Vital Signs Assessment: post-procedure vital signs reviewed and stable Respiratory status: spontaneous breathing, nonlabored ventilation, respiratory function stable and patient connected to nasal cannula oxygen Cardiovascular status: stable and blood pressure returned to baseline Postop Assessment: no apparent nausea or vomiting Anesthetic complications: no   No complications documented.   Last Vitals:  Vitals:   08/12/20 0406 08/12/20 0728  BP: 127/62 122/67  Pulse: (!) 59 62  Resp: 18 17  Temp: 36.7 C 36.7 C  SpO2: 98% 92%    Last Pain:  Vitals:   08/12/20 0938  TempSrc:   PainSc: [place]

## 2020-08-12 NOTE — Progress Notes (Signed)
Daily Progress Note   Subjective  - 1 Day Post-Op  Left second toe amputation.  Doing well.  No complaints today.  Objective Vitals:   08/11/20 1557 08/11/20 2022 08/12/20 0406 08/12/20 0728  BP: 121/70 (!) 116/52 127/62 122/67  Pulse: 69 67 (!) 59 62  Resp: '16 18 18 17  '$ Temp: 98.2 F (36.8 C) 98.1 F (36.7 C) 98 F (36.7 C) 98.1 F (36.7 C)  TempSrc: Oral Oral  Oral  SpO2: 95% 97% 98% 92%  Weight:   84.7 kg   Height:        Physical Exam: The incision is well coapted.  No dehiscence.  Good perfusion of the skin flaps at this time.  Laboratory CBC    Component Value Date/Time   WBC 8.2 08/12/2020 0444   HGB 12.5 (L) 08/12/2020 0444   HGB 15.0 06/08/2014 1037   HCT 36.9 (L) 08/12/2020 0444   HCT 44.5 06/08/2014 1037   PLT 132 (L) 08/12/2020 0444   PLT 261 06/08/2014 1037    BMET    Component Value Date/Time   NA 134 (L) 08/12/2020 0444   NA 135 07/07/2020 1211   NA 136 06/08/2014 1037   K 4.8 08/12/2020 0444   K 4.3 06/08/2014 1037   CL 99 08/12/2020 0444   CL 101 06/08/2014 1037   CO2 28 08/12/2020 0444   CO2 27 06/08/2014 1037   GLUCOSE 129 (H) 08/12/2020 0444   GLUCOSE 415 (H) 06/08/2014 1037   BUN 29 (H) 08/12/2020 0444   BUN 42 (H) 07/07/2020 1211   BUN 9 06/08/2014 1037   CREATININE 1.23 08/12/2020 0444   CREATININE 0.87 02/05/2018 1417   CALCIUM 9.2 08/12/2020 0444   CALCIUM 9.2 06/08/2014 1037   GFRNONAA >60 08/12/2020 0444   GFRNONAA >60 06/08/2014 1037   GFRNONAA >60 02/17/2014 0909   GFRAA 52 (L) 07/07/2020 1211   GFRAA >60 06/08/2014 1037   GFRAA >60 02/17/2014 0909    Assessment/Planning: Osteomyelitis status post second toe amputation 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  New dressing applied.  I discussed with the patient the dressing really does not need to be changed too often.  Every 3 to 5 days if it becomes soiled or if there is any  breakthrough bleeding he can change it with a dry dressing.  He felt comfortable with this.  Discussed with internal medicine to continue with oral antibiotics for about 5 days and then can discontinue thereafter.  Stay in his postop shoe for his ambulation.  He will follow-up with me in about a week and a half for reevaluation.  Stable from podiatry standpoint for discharge.    Samara Deist A  08/12/2020, 10:04 AM

## 2020-08-12 NOTE — Discharge Summary (Signed)
[facility] NAME: [name] [name]    MR#:  [id number]  DATE OF BIRTH:  Feb 21, 1961  DATE OF ADMISSION:  08/05/2020 ADMITTING PHYSICIAN: Cherene Altes, MD  DATE OF DISCHARGE: 08/12/2020 12:48 PM  PRIMARY CARE PHYSICIAN: [name], MD    ADMISSION DIAGNOSIS:  Cellulitis [L03.90] Diabetic foot infection ([place]) [E11.628, L08.9]  DISCHARGE DIAGNOSIS:  Principal Problem:   Cellulitis Active Problems:   PAD (peripheral artery disease) ([place])   HLD (hyperlipidemia)   Hypertension   Diabetic foot infection ([place])   Chest pain   Type 2 diabetes mellitus with hyperlipidemia (HCC)   COPD (chronic obstructive pulmonary disease) (HCC)   Chronic pain syndrome   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HCC)   Acute on chronic systolic CHF (congestive heart failure) (HCC)   CAD (coronary artery disease)   Persistent atrial fibrillation (HCC)   PVD (peripheral vascular disease) (HCC)   Chronic anticoagulation   CHF (congestive heart failure) (HCC)   Demand ischemia (HCC)   Chronic a-fib (HCC)   Pyogenic inflammation of bone ([place])   SECONDARY DIAGNOSIS:   Past Medical History:  Diagnosis Date  . Acute on chronic heart failure ([place])   . AKI (acute kidney injury) ([place]) 01/03/2018  . Angina at rest [facility]) 08/13/2017  . Arthritis   . Asthma   . Atherosclerosis   . BPH (benign prostatic hyperplasia)   . Carotid arterial disease ([place])   . Cellulitis of foot, left 09/27/2019  . Charcot's joint of foot, right   . Chronic combined systolic (congestive) and diastolic (congestive) heart failure ([place])    a. 02/2013 EF 50% by LV gram; b. 04/2017 Echo: EF 25-30%. diff HK. Gr2 DD. Mod dil LA/RV. PASP 37mHg.  .Marland KitchenChronic foot pain (Secondary Area of Pain) (Right) 01/29/2018  . COPD (chronic obstructive pulmonary disease) ([place]   . COPD with acute exacerbation ([place]ake [place] 01/30/2020  . Coronary artery disease    a. 2013 S/P PCI of LAD ([facility];  b.  03/2013 PCI: RCA 90p ( 2.5 x 23 mm DES); c. 02/2014 Cath: patent RCA stent->Med Rx; d. 04/2017 Cath: LM nl, LAd 30ost/p, 636m, D1 80ost, LCX 90p/m, OM1 85, RCA 70/20p, 8069m0d->Referred for CT Surg-felt to be poor candidate.  . Coronary artery disease of native artery of native heart with stable angina pectoris (HCCDeuel/08/2017   [facility] Nuclear stress test showed inferior wall defect. Cardiac catheterization showed patent LAD stent with 90% stenosis in the proximal RCA. Ejection fraction was 50%. Status post PCI of the RCA with a 2.5 x 23 mm drug-eluting stent  . Diabetic neuropathy (HCCAlfords[place] . Diabetic ulcer of right foot (HCCBur[place] . Hernia   . HFrEF (heart failure with reduced ejection fraction) (HCCTyler/22/2021  . Hyperlipidemia   . Hypertension   . Ischemic cardiomyopathy    a. 04/2017 Echo: EF 25-30%; b. 08/2017 Cardiac MRI ([facility]): EF 19%, sev glob HK. RVEF 20%, mod BAE, triv MR, mild-mod TR. Basal lateral subendocardial infarct (viable), inf/infsept, dist septal ischemia, basal to mid lat peri-infarct ischemia.  . Morbid obesity (HCCGrand River . Neuropathy   . PAD (peripheral artery disease) (HCCPetersburg  a. Followed by Dr. DewLucky Cowboy. 08/2010 Periph Angio: RSFA 70-80p (6X60 self-expanding stent); c. 09/2010 Periph Angio: L SFA 100p (7x unknown length self-expanding stent); d. 06/2015 Periph Angio:  R SFA short segment occlusion (6x12 self-expanding stent).  . Restless leg syndrome   . Sepsis ([place]) 03/30/2018  . Sleep apnea   . Subclavian artery stenosis, right ([place])   . Syncope and collapse   . Tobacco use    a. 75+ yr hx - still smoking 1ppd, down from 2 ppd.  . Type II diabetes mellitus ([place])   . Unstable angina ([place]) 07/11/2016  . Varicose vein     HOSPITAL COURSE:   1.  Osteomyelitis of the left second toe with diabetic foot infection and cellulitis.  Podiatry did an amputation of the left second toe on 08/12/2019.   Podiatry evaluated the wound today and was okay with disposition.  IV antibiotics of vancomycin and Zosyn was switched over to doxycycline for 5 more days. 2.  NSTEMI.  Medical management as per cardiology on Coreg, atorvastatin and Plavix 3.  Acute on chronic systolic congestive heart failure.  Patient was on IV Lasix during the hospital course and switched over to torsemide 40 mg daily upon disposition.  Continue Coreg, digoxin, Imdur and losartan.  Can go back on spironolactone as outpatient.  Can continue [place].  Patient can take an extra torsemide if gains 3 pounds in a day or 5 pounds in a week. 4.  Type 2 diabetes mellitus with hyperlipidemia.  Hemoglobin A1c very good at 6.5.  Patient is on diet control.  Can go back on Farxiga which will help with CHF.  Continue atorvastatin. 5.  Severe peripheral vascular disease on Eliquis and Plavix 6.  Chronic atrial fibrillation on Eliquis for anticoagulation and Coreg for rate control. 7.  Diabetic neuropathy and phantom limb pains.  Patient on gabapentin.  Patient also on chronic pain medication. 8.  Nonsustained ventricular tachycardia secondary to low ejection fraction.  Seen by cardiology.  DISCHARGE CONDITIONS:   Satisfactory  CONSULTS OBTAINED:  Treatment Team:  Samara Deist, DPM [facility] cardiology DRUG ALLERGIES:   Allergies  Allergen Reactions  . Dulaglutide Anaphylaxis, Diarrhea and Hives  . Ace Inhibitors     hypotension  . Other Itching    Skin itching associated with nitro patch  . Prednisone Rash    DISCHARGE MEDICATIONS:   Allergies as of 08/12/2020      Reactions   Dulaglutide Anaphylaxis, Diarrhea, Hives   Ace Inhibitors    hypotension   Other Itching   Skin itching associated with nitro patch   Prednisone Rash      Medication List    STOP taking these medications   levofloxacin 750 MG tablet Commonly known as: Levaquin   sodium chloride 1 g tablet     TAKE these medications   albuterol 108 (90 Base)  MCG/ACT inhaler Commonly known as: VENTOLIN HFA Inhale into the lungs every 6 (six) hours as needed for wheezing or shortness of breath.   atorvastatin 80 MG tablet Commonly known as: LIPITOR Take 80 mg by mouth daily.   carvedilol 3.125 MG tablet Commonly known as: COREG Take 1 tablet (3.125 mg total) by mouth 2 (two) times daily with a meal.   clopidogrel 75 MG tablet Commonly known as: PLAVIX Take 75 mg by mouth daily.   digoxin 0.125 MG tablet Commonly known as: LANOXIN Take 1 tablet (0.125 mg total) by mouth daily.   doxycycline 100 MG tablet Commonly known as: VIBRA-TABS Take 1 tablet (100 mg total) by mouth every 12 (twelve) hours for 5 days.   DULoxetine 30 MG capsule Commonly known as: CYMBALTA Take 30 mg  by mouth daily.   Eliquis 5 MG Tabs tablet Generic drug: apixaban Take 5 mg by mouth 2 (two) times daily.   empagliflozin 10 MG Tabs tablet Commonly known as: JARDIANCE Take 10 mg by mouth daily.   ezetimibe 10 MG tablet Commonly known as: ZETIA Take 10 mg by mouth daily.   gabapentin 800 MG tablet Commonly known as: NEURONTIN Take 800 mg by mouth 4 (four) times daily.   ipratropium-albuterol 0.5-2.5 (3) MG/3ML Soln Commonly known as: DUONEB Inhale 3 mLs into the lungs every 6 (six) hours as needed (wheezing/sob). What changed: Another medication with the same name was removed. Continue taking this medication, and follow the directions you see here.   isosorbide mononitrate 30 MG 24 hr tablet Commonly known as: IMDUR Take 30 mg by mouth daily.   losartan 25 MG tablet Commonly known as: COZAAR Take 0.5 tablets (12.5 mg total) by mouth daily. Start taking on: August 13, 2020   Multi-Vitamins Tabs Take 1 tablet by mouth daily.   nitroGLYCERIN 0.4 MG SL tablet Commonly known as: NITROSTAT Place 1 tablet (0.4 mg total) under the tongue every 5 (five) minutes as needed for chest pain.   omeprazole 40 MG capsule Commonly known as: PRILOSEC Take 40  mg by mouth daily.   Oxycodone HCl 10 MG Tabs Take 10 mg by mouth every 6 (six) hours as needed (pain).   polyethylene glycol 17 g packet Commonly known as: MIRALAX / GLYCOLAX Take 17 g by mouth daily as needed for mild constipation.   potassium chloride 10 MEQ tablet Commonly known as: KLOR-CON Take 10 mEq by mouth 2 (two) times daily.   spironolactone 25 MG tablet Commonly known as: ALDACTONE Take 0.5 tablets (12.5 mg total) by mouth daily.   Symbicort 80-4.5 MCG/ACT inhaler Generic drug: budesonide-formoterol Inhale 2 puffs into the lungs daily.   tamsulosin 0.4 MG Caps capsule Commonly known as: FLOMAX Take 1 capsule (0.4 mg total) by mouth daily after supper.   torsemide 20 MG tablet Commonly known as: DEMADEX Take 2 tablets (40 mg total) by mouth daily.   traZODone 150 MG tablet Commonly known as: DESYREL Take 150 mg by mouth at bedtime.   Vericiguat 5 MG Tabs Take 5 mg by mouth daily.        DISCHARGE INSTRUCTIONS:   Follow-up with PMD 5 days follow-up cardiology  If you experience worsening of your admission symptoms, develop shortness of breath, life threatening emergency, suicidal or homicidal thoughts you must seek medical attention immediately by calling 911 or calling your MD immediately  if symptoms less severe.  You Must read complete instructions/literature along with all the possible adverse reactions/side effects for all the Medicines you take and that have been prescribed to you. Take any new Medicines after you have completely understood and accept all the possible adverse reactions/side effects.   Please note  You were cared for by a hospitalist during your hospital stay. If you have any questions about your discharge medications or the care you received while you were in the hospital after you are discharged, you can call the unit and asked to speak with the hospitalist on call if the hospitalist that took care of you is not available. Once you  are discharged, your primary care physician will handle any further medical issues. Please note that NO REFILLS for any discharge medications will be authorized once you are discharged, as it is imperative that you return to your primary care physician (or establish a relationship  with a primary care physician if you do not have one) for your aftercare needs so that they can reassess your need for medications and monitor your lab values.    Today   CHIEF COMPLAINT:   Chief Complaint  Patient presents with  . Foot Pain    HISTORY OF PRESENT ILLNESS:  [name] [name]  is a 60 y.o. male came in with foot pain and left second toe pain and started on antibiotics for diabetic foot infection.   VITAL SIGNS:  Blood pressure 122/67, pulse 62, temperature 98.1 F (36.7 C), temperature source Oral, resp. rate 17, height '5\' 7"'$  (1.702 m), weight 84.7 kg, SpO2 92 %.  I/O:    Intake/Output Summary (Last 24 hours) at 08/12/2020 1500 Last data filed at 08/12/2020 1124 Gross per 24 hour  Intake 273.64 ml  Output 1550 ml  Net -1276.36 ml    PHYSICAL EXAMINATION:  GENERAL:  60 y.o.-year-old patient lying in the bed with no acute distress.  EYES: Pupils equal, round, reactive to light and accommodation. No scleral icterus. Extraocular muscles intact.  HEENT: Head atraumatic, normocephalic. Oropharynx and nasopharynx clear.  LUNGS: Normal breath sounds bilaterally, no wheezing, rales,rhonchi or crepitation. No use of accessory muscles of respiration.  CARDIOVASCULAR: S1, S2 normal. No murmurs, rubs, or gallops.  ABDOMEN: Soft, non-tender, non-distended.  Ventral hernia.  EXTREMITIES: No pedal edema.  NEUROLOGIC: Cranial nerves II through XII are intact. Muscle strength 5/5 in all extremities. Sensation intact. Gait not checked.  PSYCHIATRIC: The patient is alert and oriented x 3.  SKIN: Left foot wrapped  DATA REVIEW:   CBC Recent Labs  Lab 08/12/20 0444  WBC 8.2  HGB 12.5*  HCT 36.9*  PLT  132*    Chemistries  Recent Labs  Lab 08/09/20 0422 08/10/20 0556 08/11/20 0521 08/12/20 0444  NA 134*  --    < > 134*  K 4.1 3.9   < > 4.8  CL 95*  --    < > 99  CO2 30  --    < > 28  GLUCOSE 110*  --    < > 129*  BUN 17  --    < > 29*  CREATININE 1.23  --    < > 1.23  CALCIUM 9.3  --    < > 9.2  MG 2.1 1.9  --   --   AST 16  --   --   --   ALT 9  --   --   --   ALKPHOS 69  --   --   --   BILITOT 1.9*  --   --   --    < > = values in this interval not displayed.    Microbiology Results  Results for orders placed or performed during the hospital encounter of 08/05/20  SARS CORONAVIRUS 2 (TAT 6-24 HRS) Nasopharyngeal Nasopharyngeal Swab     Status: None   Collection Time: 08/06/20  4:11 AM   Specimen: Nasopharyngeal Swab  Result Value Ref Range Status   SARS Coronavirus 2 NEGATIVE NEGATIVE Final    Comment: (NOTE) SARS-CoV-2 target nucleic acids are NOT DETECTED.  The SARS-CoV-2 RNA is generally detectable in upper and lower respiratory specimens during the acute phase of infection. Negative results do not preclude SARS-CoV-2 infection, do not rule out co-infections with other pathogens, and should not be used as the sole basis for treatment or other patient management decisions. Negative results must be combined with clinical observations, patient history,  and epidemiological information. The expected result is Negative.  Fact Sheet for Patients: SugarRoll.be  Fact Sheet for Healthcare Providers: https://www.woods-mathews.com/  This test is not yet approved or cleared by the [place] FDA and  has been authorized for detection and/or diagnosis of SARS-CoV-2 by FDA under an Emergency Use Authorization (EUA). This EUA will remain  in effect (meaning this test can be used) for the duration of the COVID-19 declaration under Se ction 564(b)(1) of the Act, 21 U.S.C. section 360bbb-3(b)(1), unless the authorization is  terminated or revoked sooner.  Performed at [facility], [place] [address] [place], [place] [address]       Management plans discussed with the patient, family and they are in agreement.  CODE STATUS:     Code Status Orders  (From admission, onward)         Start     Ordered   08/05/20 2333  Full code  Continuous        08/05/20 2335        Code Status History    Date Active Date Inactive Code Status Order ID Comments User Context   07/17/2020 1120 07/17/2020 2127 Full Code [id number]  Algernon Huxley, MD Inpatient   06/30/2020 1606 07/01/2020 1943 Full Code [id number]  Rolley Sims, RN Inpatient   06/19/2020 1028 06/30/2020 1606 [id number]  Pershing Proud, NP Inpatient   06/17/2020 0509 06/19/2020 1028 Full Code [id number]  Athena Masse, MD ED   01/19/2020 1139 01/21/2020 0030 Full Code [id number]  Ivor Costa, MD ED   09/27/2019 0427 09/30/2019 1952 Full Code [id number]  Athena Masse, MD ED   08/31/2019 0109 08/31/2019 0742 Full Code [id number]  Athena Masse, MD ED   08/02/2019 1147 08/02/2019 1835 Full Code [id number]  Algernon Huxley, MD Inpatient   06/14/2019 1103 06/14/2019 1851 Full Code [id number]  Algernon Huxley, MD Inpatient   11/06/2018 0023 11/09/2018 1816 Full Code [id number]  Lance Coon, MD ED   08/24/2018 0939 08/24/2018 1444 Full Code [id number]  Algernon Huxley, MD Inpatient   07/27/2018 2248 07/28/2018 1836 Full Code [id number]  Vaughan Basta, MD Inpatient   03/30/2018 1839 04/07/2018 2116 Full Code [id number]  Nicholes Mango, MD Inpatient   03/03/2018 1044 03/03/2018 1545 Full Code [id number]  Schnier, Dolores Lory, MD Inpatient   01/03/2018 0409 01/09/2018 1723 Full Code [id number]  Lance Coon, MD Inpatient   12/22/2017 0607 12/23/2017 1916 Full Code [id number]  Harrie Foreman, MD Inpatient   11/21/2017 0415 11/23/2017 1444 Full Code [id number]  Lance Coon, MD Inpatient   04/23/2017 0355 04/29/2017 1353 Full Code [id number]  Saundra Shelling, MD Inpatient   07/11/2016 0324 07/11/2016  1851 Full Code [id number]  Saundra Shelling, MD Inpatient   05/06/2016 0005 05/07/2016 1417 Full Code [id number]  [place], Island Walk, DO Inpatient   11/21/2015 1716 11/22/2015 1950 Full Code [id number]  Gladstone Lighter, MD Inpatient   07/06/2015 0948 07/06/2015 1437 Full Code [id number]  Algernon Huxley, MD Inpatient   Advance Care Planning Activity      TOTAL TIME TAKING CARE OF THIS PATIENT: 35 minutes.    Loletha Grayer M.D on 08/12/2020 at 3:00 PM  Between 7am to 6pm - Pager - [phone]  After 6pm go to [email or web address] - password [email or web address]  [facility]  CC: Primary care physician; [name], MD

## 2020-08-12 NOTE — Progress Notes (Signed)
Physical Therapy Treatment Patient Details Name: [name] [name] MRN: [id number] DOB: February 25, 1961 Today's Date: 08/12/2020    History of Present Illness Pt is a 60 y/o M with PMH: Arthritis, Asthma, Atherosclerosis, BPH, Charcot's joint of foot-right, Chronic combined systolic and diastolic heart failure, COPD, CAD/PCI, tobacco abuse, PAD s/p L SFA stenting, s/p R BKA, afib on Eliquis, OSA noncompliant with CPAP, and h/o leaving AMA/medical noncompliance. Pt admitted for NSTEMI, LE cellulitis. Per cardiology pt has declined CABG in the past, placed on heparin drip and to be managed medically.    PT Comments    Pt was long sitting in bed with RN in room upon arriving. He agrees to PT session and is cooperative and pleasant throughout. Easily able to exit bed, I'ly place prosthetic and post op shoe prior to standing and ambulating ~ 50 ft with RW. No LOB or balance concerns throughout. Distance limited by pain/fatigue but overall tolerated well without concerns. He was repositioned in recliner post session with call bell in reach and RN aware of pt's abilities. Recommend post acute PT as outpatient to continue to improve strength, endurance and overall safety with ADLs.     Follow Up Recommendations  Outpatient PT     Equipment Recommendations  None recommended by PT    Recommendations for Other Services       Precautions / Restrictions Precautions Precautions: Fall Restrictions Weight Bearing Restrictions: No    Mobility  Bed Mobility Overal bed mobility: Modified Independent     Transfers Overall transfer level: Modified independent       Ambulation/Gait Ambulation/Gait assistance: Modified independent (Device/Increase time) Gait Distance (Feet): 50 Feet Assistive device: Rolling walker (2 wheeled)   Gait velocity: decreased   General Gait Details: pt was easily able to ambulate with RW. constant vcs for improved wt bearing on heel       Balance Overall balance  assessment: Modified Independent         Cognition Arousal/Alertness: Awake/alert Behavior During Therapy: WFL for tasks assessed/performed Overall Cognitive Status: Within Functional Limits for tasks assessed      General Comments: Pt is A and O x 4             Pertinent Vitals/Pain Pain Assessment: 0-10 Pain Score: 8  Pain Location: L leg (reported unable to feel his L foot) Pain Descriptors / Indicators: Burning;Aching Pain Intervention(s): Limited activity within patient's tolerance;Monitored during session;Premedicated before session;Repositioned           PT Goals (current goals can now be found in the care plan section) Acute Rehab PT Goals Patient Stated Goal: to go home Progress towards PT goals: Progressing toward goals    Frequency    Min 2X/week      PT Plan Current plan remains appropriate       AM-PAC PT "6 Clicks" Mobility   Outcome Measure  Help needed turning from your back to your side while in a flat bed without using bedrails?: None Help needed moving from lying on your back to sitting on the side of a flat bed without using bedrails?: None Help needed moving to and from a bed to a chair (including a wheelchair)?: None Help needed standing up from a chair using your arms (e.g., wheelchair or bedside chair)?: None Help needed to walk in hospital room?: None Help needed climbing 3-5 steps with a railing? : A Little 6 Click Score: 23    End of Session Equipment Utilized During Treatment: Gait belt Activity Tolerance:  Patient tolerated treatment well Patient left: in chair;with call bell/phone within reach;with chair alarm set Nurse Communication: Mobility status PT Visit Diagnosis: Muscle weakness (generalized) (M62.81);Difficulty in walking, not elsewhere classified (R26.2)     Time: [id number] PT Time Calculation (min) (ACUTE ONLY): 28 min  Charges:  $Gait Training: 8-22 mins $Therapeutic Activity: 8-22 mins                      Julaine Fusi PTA 08/12/20, 10:47 AM

## 2020-08-15 LAB — SURGICAL PATHOLOGY

## 2020-08-16 ENCOUNTER — Ambulatory Visit: Payer: Medicare Other | Admitting: Podiatry

## 2020-08-22 ENCOUNTER — Emergency Department: Payer: Medicare Other

## 2020-08-22 ENCOUNTER — Other Ambulatory Visit: Payer: Self-pay

## 2020-08-22 ENCOUNTER — Emergency Department
Admission: EM | Admit: 2020-08-22 | Discharge: 2020-08-22 | Disposition: A | Discharge status: Home / Self Care | Payer: Medicare Other | Attending: Emergency Medicine | Admitting: Emergency Medicine

## 2020-08-22 DIAGNOSIS — R0602 Shortness of breath: Secondary | ICD-10-CM | POA: Insufficient documentation

## 2020-08-22 DIAGNOSIS — I5022 Chronic systolic (congestive) heart failure: Secondary | ICD-10-CM

## 2020-08-22 DIAGNOSIS — Z7984 Long term (current) use of oral hypoglycemic drugs: Secondary | ICD-10-CM | POA: Insufficient documentation

## 2020-08-22 DIAGNOSIS — Z7902 Long term (current) use of antithrombotics/antiplatelets: Secondary | ICD-10-CM | POA: Insufficient documentation

## 2020-08-22 DIAGNOSIS — R5383 Other fatigue: Secondary | ICD-10-CM | POA: Diagnosis present

## 2020-08-22 DIAGNOSIS — Z20822 Contact with and (suspected) exposure to covid-19: Secondary | ICD-10-CM | POA: Diagnosis not present

## 2020-08-22 DIAGNOSIS — Z7901 Long term (current) use of anticoagulants: Secondary | ICD-10-CM | POA: Diagnosis not present

## 2020-08-22 DIAGNOSIS — Z7951 Long term (current) use of inhaled steroids: Secondary | ICD-10-CM | POA: Diagnosis not present

## 2020-08-22 DIAGNOSIS — I5042 Chronic combined systolic (congestive) and diastolic (congestive) heart failure: Secondary | ICD-10-CM | POA: Insufficient documentation

## 2020-08-22 DIAGNOSIS — Z89511 Acquired absence of right leg below knee: Secondary | ICD-10-CM | POA: Diagnosis not present

## 2020-08-22 DIAGNOSIS — F17210 Nicotine dependence, cigarettes, uncomplicated: Secondary | ICD-10-CM | POA: Diagnosis not present

## 2020-08-22 DIAGNOSIS — Z79899 Other long term (current) drug therapy: Secondary | ICD-10-CM | POA: Insufficient documentation

## 2020-08-22 DIAGNOSIS — J441 Chronic obstructive pulmonary disease with (acute) exacerbation: Secondary | ICD-10-CM | POA: Diagnosis not present

## 2020-08-22 DIAGNOSIS — I25118 Atherosclerotic heart disease of native coronary artery with other forms of angina pectoris: Secondary | ICD-10-CM | POA: Insufficient documentation

## 2020-08-22 DIAGNOSIS — E119 Type 2 diabetes mellitus without complications: Secondary | ICD-10-CM | POA: Diagnosis not present

## 2020-08-22 DIAGNOSIS — I110 Hypertensive heart disease with heart failure: Secondary | ICD-10-CM | POA: Insufficient documentation

## 2020-08-22 DIAGNOSIS — E860 Dehydration: Secondary | ICD-10-CM | POA: Diagnosis not present

## 2020-08-22 DIAGNOSIS — J45909 Unspecified asthma, uncomplicated: Secondary | ICD-10-CM | POA: Diagnosis not present

## 2020-08-22 LAB — APTT: aPTT: 32 seconds (ref 24–36)

## 2020-08-22 LAB — COMPREHENSIVE METABOLIC PANEL
ALT: 13 U/L (ref 0–44)
AST: 26 U/L (ref 15–41)
Albumin: 4.2 g/dL (ref 3.5–5.0)
Alkaline Phosphatase: 80 U/L (ref 38–126)
Anion gap: 9 (ref 5–15)
BUN: 20 mg/dL (ref 6–20)
CO2: 31 mmol/L (ref 22–32)
Calcium: 9.1 mg/dL (ref 8.9–10.3)
Chloride: 93 mmol/L — ABNORMAL LOW (ref 98–111)
Creatinine, Ser: 1.5 mg/dL — ABNORMAL HIGH (ref 0.61–1.24)
GFR, Estimated: 54 mL/min — ABNORMAL LOW (ref >60)
Glucose, Bld: 131 mg/dL — ABNORMAL HIGH (ref 70–99)
Potassium: 3.4 mmol/L — ABNORMAL LOW (ref 3.5–5.1)
Sodium: 133 mmol/L — ABNORMAL LOW (ref 135–145)
Total Bilirubin: 1.7 mg/dL — ABNORMAL HIGH (ref 0.3–1.2)
Total Protein: 7.4 g/dL (ref 6.5–8.1)

## 2020-08-22 LAB — TROPONIN I (HIGH SENSITIVITY)
Troponin I (High Sensitivity): 168 ng/L (ref <18)
Troponin I (High Sensitivity): 142 ng/L (ref <18)

## 2020-08-22 LAB — URINALYSIS, COMPLETE (UACMP) WITH MICROSCOPIC
Bacteria, UA: NONE SEEN
Bilirubin Urine: NEGATIVE
Glucose, UA: >=500 mg/dL — AB
Ketones, ur: NEGATIVE mg/dL
Leukocytes,Ua: NEGATIVE
Nitrite: NEGATIVE
Protein, ur: NEGATIVE mg/dL
Specific Gravity, Urine: 1.008 (ref 1.005–1.030)
pH: 6 (ref 5.0–8.0)

## 2020-08-22 LAB — RESP PANEL BY RT-PCR (FLU A&B, COVID) ARPGX2
Influenza A by PCR: NEGATIVE
Influenza B by PCR: NEGATIVE
SARS Coronavirus 2 by RT PCR: NEGATIVE

## 2020-08-22 LAB — CBC WITH DIFFERENTIAL/PLATELET
Abs Immature Granulocytes: 0.05 10*3/uL (ref 0.00–0.07)
Basophils Absolute: 0.1 10*3/uL (ref 0.0–0.1)
Basophils Relative: 1 %
Eosinophils Absolute: 0.2 10*3/uL (ref 0.0–0.5)
Eosinophils Relative: 3 %
HCT: 38.5 % — ABNORMAL LOW (ref 39.0–52.0)
Hemoglobin: 12.6 g/dL — ABNORMAL LOW (ref 13.0–17.0)
Immature Granulocytes: 1 %
Lymphocytes Relative: 12 %
Lymphs Abs: 0.9 10*3/uL (ref 0.7–4.0)
MCH: 28.3 pg (ref 26.0–34.0)
MCHC: 32.7 g/dL (ref 30.0–36.0)
MCV: 86.3 fL (ref 80.0–100.0)
Monocytes Absolute: 0.7 10*3/uL (ref 0.1–1.0)
Monocytes Relative: 8 %
Neutro Abs: 6 10*3/uL (ref 1.7–7.7)
Neutrophils Relative %: 75 %
Platelets: 220 10*3/uL (ref 150–400)
RBC: 4.46 MIL/uL (ref 4.22–5.81)
RDW: 18.7 % — ABNORMAL HIGH (ref 11.5–15.5)
WBC: 7.9 10*3/uL (ref 4.0–10.5)
nRBC: 0 % (ref 0.0–0.2)

## 2020-08-22 LAB — LACTIC ACID, PLASMA
Lactic Acid, Venous: 1.5 mmol/L (ref 0.5–1.9)
Lactic Acid, Venous: 1.2 mmol/L (ref 0.5–1.9)

## 2020-08-22 LAB — PROTIME-INR
INR: 1.1 (ref 0.8–1.2)
Prothrombin Time: 14.1 seconds (ref 11.4–15.2)

## 2020-08-22 MED ORDER — METRONIDAZOLE IN NACL 5-0.79 MG/ML-% IV SOLN
500.0000 mg | Freq: Once | INTRAVENOUS | Status: AC
  Administered 2020-08-22: 500 mg via INTRAVENOUS
  Filled 2020-08-22: qty 100

## 2020-08-22 MED ORDER — SODIUM CHLORIDE 0.9 % IV BOLUS
500.0000 mL | Freq: Once | INTRAVENOUS | Status: AC
  Administered 2020-08-22: 500 mL via INTRAVENOUS

## 2020-08-22 MED ORDER — ATROPINE SULFATE 1 MG/10ML IJ SOSY
0.5000 mg | PREFILLED_SYRINGE | Freq: Once | INTRAMUSCULAR | Status: AC
  Administered 2020-08-22: 0.5 mg via INTRAVENOUS
  Filled 2020-08-22: qty 10

## 2020-08-22 MED ORDER — SODIUM CHLORIDE 0.9 % IV SOLN
2.0000 g | Freq: Once | INTRAVENOUS | Status: AC
  Administered 2020-08-22: 2 g via INTRAVENOUS
  Filled 2020-08-22: qty 2

## 2020-08-22 MED ORDER — SODIUM CHLORIDE 0.9 % IV BOLUS (SEPSIS)
500.0000 mL | Freq: Once | INTRAVENOUS | Status: AC
  Administered 2020-08-22: 500 mL via INTRAVENOUS

## 2020-08-22 MED ORDER — IOHEXOL 350 MG/ML SOLN
100.0000 mL | Freq: Once | INTRAVENOUS | Status: AC | PRN
  Administered 2020-08-22: 100 mL via INTRAVENOUS

## 2020-08-22 MED ORDER — VANCOMYCIN HCL IN DEXTROSE 1-5 GM/200ML-% IV SOLN
1000.0000 mg | Freq: Once | INTRAVENOUS | Status: AC
  Administered 2020-08-22: 1000 mg via INTRAVENOUS
  Filled 2020-08-22: qty 200

## 2020-08-22 NOTE — ED Triage Notes (Addendum)
Pt to ED via [facility] from home. Pt called EMS because "just not feeling good" and having bilateral shoulder pain. Pt found to be hypotensive by EMS, 80s/50s. Pt has hx CHF and DM with R below the knee amputation and recent toe amputation 2 wk ago on L foot (bandaged now). CBG was 221. Pt alert and oriented at this time. Pt denies CP and SOB.

## 2020-08-22 NOTE — Consult Note (Signed)
CODE SEPSIS - PHARMACY COMMUNICATION  **Broad Spectrum Antibiotics should be administered within 1 hour of Sepsis diagnosis**  Time Code Sepsis Called/Page Received: 1614   Antibiotics Ordered: cefepime/vancomycin  Time of 1st antibiotic administration: [place] ,PharmD Clinical Pharmacist  08/22/2020  4:44 PM

## 2020-08-22 NOTE — Progress Notes (Signed)
Pt will not receive 30 ml / kg fluid resuscitation for Sepsis due to CHF.

## 2020-08-22 NOTE — ED Notes (Signed)
EDP at bedside. VS and EKG being obtained. EMS had stated that pt had RBB block on their EKG.

## 2020-08-22 NOTE — ED Notes (Signed)
Troponin 168. Dr Joni Fears informed.

## 2020-08-22 NOTE — ED Notes (Signed)
Pt to CT

## 2020-08-22 NOTE — Progress Notes (Signed)
[facility] is following Code Sepsis 

## 2020-08-22 NOTE — ED Provider Notes (Addendum)
[facility] Emergency Department Provider Note  ____________________________________________  Time seen: Approximately 4:18 PM  I have reviewed the triage vital signs and the nursing notes.   HISTORY  Chief Complaint Hypotension    HPI [name] [name] is a 60 y.o. male with a history of hypertension, diabetes, COPD, CAD, severe CHF with an ejection fraction of 15%  who comes the ED complaining of upper back pain radiating into his neck and bilateral shoulders that started at 2:00 PM today, continuous.  Worse with turning the head left and right.  Associated with shortness of breath and fatigue.  Not typical for his previous pain syndromes.  Ranges from 5 out of 10 to 9 out of 10 depending on if he is still or moving.     Past Medical History:  Diagnosis Date  . Acute on chronic heart failure ([place])   . AKI (acute kidney injury) ([place]) 01/03/2018  . Angina at rest [facility]) 08/13/2017  . Arthritis   . Asthma   . Atherosclerosis   . BPH (benign prostatic hyperplasia)   . Carotid arterial disease ([place])   . Cellulitis of foot, left 09/27/2019  . Charcot's joint of foot, right   . Chronic combined systolic (congestive) and diastolic (congestive) heart failure ([place])    a. 02/2013 EF 50% by LV gram; b. 04/2017 Echo: EF 25-30%. diff HK. Gr2 DD. Mod dil LA/RV. PASP 32mHg.  .Marland KitchenChronic foot pain (Secondary Area of Pain) (Right) 01/29/2018  . COPD (chronic obstructive pulmonary disease) (HVin[place]   . COPD with acute exacerbation ([place] 01/30/2020  . Coronary artery disease    a. 2013 S/P PCI of LAD ([facility];  b. 03/2013 PCI: RCA 90p ( 2.5 x 23 mm DES); c. 02/2014 Cath: patent RCA stent->Med Rx; d. 04/2017 Cath: LM nl, LAd 30ost/p, 641m, D1 80ost, LCX 90p/m, OM1 85, RCA 70/20p, 8016m0d->Referred for CT Surg-felt to be poor candidate.  . Coronary artery disease of native artery of native heart with stable angina pectoris (HCCSacate Village/08/2017   [facility] Nuclear stress test showed inferior wall defect. Cardiac catheterization showed patent LAD stent with 90% stenosis in the proximal RCA. Ejection fraction was 50%. Status post PCI of the RCA with a 2.5 x 23 mm drug-eluting stent  . Diabetic neuropathy (HCCMcCaskill . Diabetic ulcer of right foot (HCCAnthem . Hernia   . HFrEF (heart failure with reduced ejection fraction) (HCCLake Aluma/22/2021  . Hyperlipidemia   . Hypertension   . Ischemic cardiomyopathy    a. 04/2017 Echo: EF 25-30%; b. 08/2017 Cardiac MRI ([facility]): EF 19%, sev glob HK. RVEF 20%, mod BAE, triv MR, mild-mod TR. Basal lateral subendocardial infarct (viable), inf/infsept, dist septal ischemia, basal to mid lat peri-infarct ischemia.  . Morbid obesity (HCCPittsboro . Neuropathy   . PAD (peripheral artery disease) (HCCSan Augustine  a. Followed by Dr. DewLucky Cowboy. 08/2010 Periph Angio: RSFA 70-80p (6X60 self-expanding stent); c. 09/2010 Periph Angio: L SFA 100p (7x unknown length self-expanding stent); d. 06/2015 Periph Angio: R SFA short segment occlusion (6x12 self-expanding stent).  . Restless leg syndrome   . Sepsis (HCCGarfield0/21/2019  . Sleep apnea   . Subclavian artery stenosis, right (HCCPine Bush . Syncope and collapse   . Tobacco use    a. 75+ yr hx - still smoking 1ppd, down from 2 ppd.  . Type II  diabetes mellitus ([place])   . Unstable angina ([place]) 07/11/2016  . Varicose vein      Patient Active Problem List   Diagnosis Date Noted  . Acquired absence of right leg below knee ([place]) 08/22/2020  . Pyogenic inflammation of bone ([place])   . Chronic a-fib ([place])   . Demand ischemia ([place])   . Palliative care by specialist   . CHF (congestive heart failure) ([place]) 06/18/2020  . Chronic anticoagulation 06/17/2020  . Chronic venous insufficiency 02/10/2020  . Persistent atrial fibrillation ([place]) 01/19/2020  . Chronic bilateral low back pain with sciatica 10/08/2019  . Medication management contract signed 10/08/2019  . Chronic  hyponatremia 09/27/2019  . CAD (coronary artery disease) 08/31/2019  . Acute on chronic systolic CHF (congestive heart failure) ([place]) 11/05/2018  . Open toe wound 10/01/2018  . Venous ulcer of left leg ([place]) 08/19/2018  . 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[place]) 04/28/2018  . Abnormal MRI, shoulder (Right) 02/23/2018  . Abnormal MRI, cervical spine (2016) 02/23/2018  . DDD (degenerative disc disease), cervical 02/23/2018  . Cervical foraminal stenosis (Bilateral) 02/23/2018  . Cervical central spinal stenosis 02/23/2018  . Cervical facet hypertrophy 02/23/2018  . Arthralgia of acromioclavicular joint (Right) 02/23/2018  . Osteoarthritis of  AC (acromioclavicular) joint (Right) 02/23/2018  . Biceps tendinosis of shoulder (Right) 02/23/2018  . Tendinopathy of rotator cuff (Right) 02/23/2018  . Vitamin D deficiency 02/23/2018  . Atherosclerosis of native arteries of the extremities with ulceration ([place]) 02/15/2018  . Peripheral edema 02/05/2018  . Status post peripherally inserted central catheter (PICC) central line placement 02/04/2018  . Chronic lower extremity pain (Primary Area of Pain) (Right) 01/29/2018  . Chronic shoulder pain [facility] Area of Pain) (Right) 01/29/2018  . Chronic pain syndrome 01/29/2018  . Long term current use of opiate analgesic 01/29/2018  . Medication monitoring encounter 01/29/2018  . Disorder of skeletal system 01/29/2018  . Problems influencing health status 01/29/2018  . NSTEMI (non-ST elevated myocardial infarction) ([place]) 11/21/2017  . HLD (hyperlipidemia) 09/10/2017  . Angina pectoris ([place]) 09/08/2017  . Lymphedema 05/02/2017  . Chest pain 07/11/2016  . Foot ulcer (Right)   . Stable angina (HCC)   . Pressure injury of skin 05/06/2016  . Diabetic foot infection ([place]) 05/05/2016  . Type 2 diabetes mellitus with hyperlipidemia ([place]) 01/26/2016  . Cellulitis 11/21/2015  . Morbid obesity ([place]) 11/17/2014  . PAD (peripheral artery disease)  ([place])   . Hypertension   . Tobacco use   . COPD (chronic obstructive pulmonary disease) ([place]) 05/19/2012  . Diabetic neuropathy ([place]) 05/19/2012  . PVD (peripheral vascular disease) ([place]) 03/27/2012     Past Surgical History:  Procedure Laterality Date  . ABDOMINAL AORTAGRAM N/A 06/23/2013   Procedure: ABDOMINAL Maxcine Ham;  Surgeon: Wellington Hampshire, MD;  Location: [place] CATH LAB;  Service: Cardiovascular;  Laterality: N/A;  . AMPUTATION Right 04/01/2018   Procedure: AMPUTATION BELOW KNEE;  Surgeon: Algernon Huxley, MD;  Location: [facility] ORS;  Service: Vascular;  Laterality: Right;  . AMPUTATION TOE Left 08/11/2020   Procedure: AMPUTATION TOE-2nd Toe;  Surgeon: Samara Deist, DPM;  Location: [facility] ORS;  Service: Podiatry;  Laterality: Left;  . APPENDECTOMY    . CARDIAC CATHETERIZATION  10/14   [facility]; X1 STENT PROXIMAL RCA  . CARDIAC CATHETERIZATION  02/2010   [facility]  . CARDIAC CATHETERIZATION  02/21/2014   [facility]  . CLAVICLE SURGERY    . HERNIA REPAIR    . IRRIGATION AND DEBRIDEMENT FOOT Right 01/04/2018   Procedure:  IRRIGATION AND DEBRIDEMENT FOOT;  Surgeon: Samara Deist, DPM;  Location: [facility] ORS;  Service: Podiatry;  Laterality: Right;  . LEFT HEART CATH AND CORONARY ANGIOGRAPHY N/A 01/20/2020   Procedure: LEFT HEART CATH AND CORONARY ANGIOGRAPHY;  Surgeon: Wellington Hampshire, MD;  Location: [place] CV LAB;  Service: Cardiovascular;  Laterality: N/A;  . LEFT HEART CATH AND CORS/GRAFTS ANGIOGRAPHY N/A 04/28/2017   Procedure: LEFT HEART CATH AND CORONARY ANGIOGRAPHY;  Surgeon: Wellington Hampshire, MD;  Location: [place] CV LAB;  Service: Cardiovascular;  Laterality: N/A;  . LOWER EXTREMITY ANGIOGRAPHY Right 03/03/2018   Procedure: LOWER EXTREMITY ANGIOGRAPHY;  Surgeon: Katha Cabal, MD;  Location: [place] CV LAB;  Service: Cardiovascular;  Laterality: Right;  . LOWER EXTREMITY ANGIOGRAPHY Left 08/24/2018   Procedure: LOWER EXTREMITY ANGIOGRAPHY;  Surgeon: Algernon Huxley, MD;   Location: [place] CV LAB;  Service: Cardiovascular;  Laterality: Left;  . LOWER EXTREMITY ANGIOGRAPHY Left 06/14/2019   Procedure: LOWER EXTREMITY ANGIOGRAPHY;  Surgeon: Algernon Huxley, MD;  Location: [place] CV LAB;  Service: Cardiovascular;  Laterality: Left;  . LOWER EXTREMITY ANGIOGRAPHY Left 08/02/2019   Procedure: LOWER EXTREMITY ANGIOGRAPHY;  Surgeon: Algernon Huxley, MD;  Location: [place] CV LAB;  Service: Cardiovascular;  Laterality: Left;  . LOWER EXTREMITY ANGIOGRAPHY Left 07/17/2020   Procedure: LOWER EXTREMITY ANGIOGRAPHY;  Surgeon: Algernon Huxley, MD;  Location: [place] CV LAB;  Service: Cardiovascular;  Laterality: Left;  . PERIPHERAL ARTERIAL STENT GRAFT     x2 left/right  . PERIPHERAL VASCULAR BALLOON ANGIOPLASTY Right 01/06/2018   Procedure: PERIPHERAL VASCULAR BALLOON ANGIOPLASTY;  Surgeon: Katha Cabal, MD;  Location: [place] CV LAB;  Service: Cardiovascular;  Laterality: Right;  . PERIPHERAL VASCULAR CATHETERIZATION N/A 07/06/2015   Procedure: Abdominal Aortogram w/Lower Extremity;  Surgeon: Algernon Huxley, MD;  Location: [place] CV LAB;  Service: Cardiovascular;  Laterality: N/A;  . PERIPHERAL VASCULAR CATHETERIZATION  07/06/2015   Procedure: Lower Extremity Intervention;  Surgeon: Algernon Huxley, MD;  Location: [place] CV LAB;  Service: Cardiovascular;;     Prior to Admission medications   Medication Sig Start Date End Date Taking? Authorizing Provider  albuterol (VENTOLIN HFA) 108 (90 Base) MCG/ACT inhaler Inhale into the lungs every 6 (six) hours as needed for wheezing or shortness of breath.   Yes [provider]  atorvastatin (LIPITOR) 80 MG tablet Take 80 mg by mouth daily.   Yes [provider]  carvedilol (COREG) 3.125 MG tablet Take 1 tablet (3.125 mg total) by mouth 2 (two) times daily with a meal. 07/01/20  Yes Shawna Clamp, MD  cephALEXin (KEFLEX) 500 MG capsule Take 1 capsule by mouth 4 (four) times daily.  08/22/20 09/01/20 Yes [provider]  clopidogrel (PLAVIX) 75 MG tablet Take 75 mg by mouth daily.   Yes [provider]  digoxin (LANOXIN) 0.125 MG tablet Take 1 tablet (0.125 mg total) by mouth daily. 08/12/20  Yes Wieting, Richard, MD  DULoxetine (CYMBALTA) 30 MG capsule Take 30 mg by mouth daily.    Yes [provider]  ELIQUIS 5 MG TABS tablet Take 5 mg by mouth 2 (two) times daily. 02/02/20  Yes [provider]  empagliflozin (JARDIANCE) 10 MG TABS tablet Take 10 mg by mouth daily.  02/03/20  Yes [provider]  ezetimibe (ZETIA) 10 MG tablet Take 10 mg by mouth daily.   Yes [provider]  gabapentin (NEURONTIN) 800 MG tablet Take 800 mg by mouth 4 (  four) times daily.  08/14/17 04/04/29 Yes [provider]  ipratropium-albuterol (DUONEB) 0.5-2.5 (3) MG/3ML SOLN Inhale 3 mLs into the lungs every 6 (six) hours as needed (wheezing/sob).    Yes [provider]  isosorbide mononitrate (IMDUR) 30 MG 24 hr tablet Take 30 mg by mouth daily.   Yes [provider]  losartan (COZAAR) 25 MG tablet Take 0.5 tablets (12.5 mg total) by mouth daily. 08/13/20  Yes Wieting, Richard, MD  Multiple Vitamin (MULTI-VITAMINS) TABS Take 1 tablet by mouth daily.    Yes [provider]  omeprazole (PRILOSEC) 40 MG capsule Take 40 mg by mouth daily.   Yes [provider]  Oxycodone HCl 10 MG TABS Take 10 mg by mouth every 6 (six) hours as needed (pain).  01/15/18  Yes [provider]  polyethylene glycol (MIRALAX / GLYCOLAX) 17 g packet Take 17 g by mouth daily as needed for mild constipation. 08/12/20  Yes Wieting, Richard, MD  potassium chloride (KLOR-CON) 10 MEQ tablet Take 10 mEq by mouth 2 (two) times daily.   Yes [provider]  spironolactone (ALDACTONE) 25 MG tablet Take 0.5 tablets (12.5 mg total) by mouth daily. 07/02/20  Yes Shawna Clamp, MD  SYMBICORT 80-4.5 MCG/ACT inhaler Inhale 2 puffs into the  lungs daily.  01/01/20  Yes [provider]  tamsulosin (FLOMAX) 0.4 MG CAPS capsule Take 1 capsule (0.4 mg total) by mouth daily after supper. 07/01/20  Yes Shawna Clamp, MD  torsemide (DEMADEX) 20 MG tablet Take 2 tablets (40 mg total) by mouth daily. 07/01/20  Yes Shawna Clamp, MD  traZODone (DESYREL) 150 MG tablet Take 150 mg by mouth at bedtime. 07/12/19  Yes [provider]  Vericiguat 5 MG TABS Take 5 mg by mouth daily.   Yes [provider]  nitroGLYCERIN (NITROSTAT) 0.4 MG SL tablet Place 1 tablet (0.4 mg total) under the tongue every 5 (five) minutes as needed for chest pain. 05/19/19   Alisa Graff, FNP     Allergies Dulaglutide, Ace inhibitors, Other, and Prednisone   Family History  Problem Relation Age of Onset  . Heart attack Father   . Heart disease Father   . Hypertension Father   . Hyperlipidemia Father   . Hypertension Mother   . Hyperlipidemia Mother     Social History Social History   Tobacco Use  . Smoking status: Current Every Day Smoker    Packs/day: 1.00    Years: 41.00    Pack years: 41.00    Types: Cigarettes  . Smokeless tobacco: Never Used  Vaping Use  . Vaping Use: Never used  Substance Use Topics  . Alcohol use: No  . Drug use: No    Review of Systems  Constitutional:   No fever or chills.  ENT:   No sore throat. No rhinorrhea. Cardiovascular:   No chest pain or syncope. Respiratory:   Positive shortness of breath without cough. Gastrointestinal:   Negative for abdominal pain, vomiting and diarrhea.  Musculoskeletal:   Positive upper back pain.  Positive chronic leg swelling.  Status post right AKA.  2 weeks ago had amputation of left foot toe. All other systems reviewed and are negative except as documented above in ROS and HPI.  ____________________________________________   PHYSICAL EXAM:  VITAL SIGNS: ED Triage Vitals  Enc Vitals Group     BP 08/22/20 1559 (!) 85/46     Pulse Rate 08/22/20 1559  (!) 50     Resp 08/22/20 1559  16     Temp 08/22/20 1559 98.2 F (36.8 C)     Temp Source 08/22/20 1559 Oral     SpO2 08/22/20 1559 91 %     Weight 08/22/20 1600 160 lb (72.6 kg)     Height 08/22/20 1600 '5\' 5"'$  (1.651 m)     Head Circumference --      Peak Flow --      Pain Score 08/22/20 1557 0     Pain Loc --      Pain Edu? --      Excl. in [place]? --     Vital signs reviewed, nursing assessments reviewed.   Constitutional:   Alert and oriented. Non-toxic appearance. Eyes:   Conjunctivae are normal. EOMI. PERRL. ENT      Head:   Normocephalic and atraumatic.      Nose:   Wearing a mask.      Mouth/Throat:   Wearing a mask.      Neck:   No meningismus. Full ROM. Hematological/Lymphatic/Immunilogical:   No cervical lymphadenopathy. Cardiovascular:   Bradycardic heart rate of 40. Symmetric bilateral radial and DP pulses.  No murmurs. Cap refill less than 2 seconds. Respiratory:   Normal respiratory effort without tachypnea/retractions. Breath sounds are clear and equal bilaterally. No wheezes/rales/rhonchi. Gastrointestinal:   Soft and nontender. Non distended. There is no CVA tenderness.  No rebound, rigidity, or guarding.  Musculoskeletal:   Normal range of motion in all extremities. No joint effusions.  No lower extremity tenderness.  1+ pitting edema left lower extremity.  Leg is erythematous and tender.  Right lower extremity status post AKA. Neurologic:   Normal speech and language.  Motor grossly intact. No acute focal neurologic deficits are appreciated.  Skin:    Skin is warm, dry and intact. No rash noted.  No petechiae, purpura, or bullae.  ____________________________________________    LABS (pertinent positives/negatives) (all labs ordered are listed, but only abnormal results are displayed) Labs Reviewed  COMPREHENSIVE METABOLIC PANEL - Abnormal; Notable for the following components:      Result Value   Sodium 133 (*)    Potassium 3.4 (*)    Chloride 93 (*)     Glucose, Bld 131 (*)    Creatinine, Ser 1.50 (*)    Total Bilirubin 1.7 (*)    GFR, Estimated 54 (*)    All other components within normal limits  CBC WITH DIFFERENTIAL/PLATELET - Abnormal; Notable for the following components:   Hemoglobin 12.6 (*)    HCT 38.5 (*)    RDW 18.7 (*)    All other components within normal limits  URINALYSIS, COMPLETE (UACMP) WITH MICROSCOPIC - Abnormal; Notable for the following components:   Color, Urine YELLOW (*)    APPearance CLEAR (*)    Glucose, UA >=500 (*)    Hgb urine dipstick SMALL (*)    All other components within normal limits  TROPONIN I (HIGH SENSITIVITY) - Abnormal; Notable for the following components:   Troponin I (High Sensitivity) 168 (*)    All other components within normal limits  TROPONIN I (HIGH SENSITIVITY) - Abnormal; Notable for the following components:   Troponin I (High Sensitivity) 142 (*)    All other components within normal limits  RESP PANEL BY RT-PCR (FLU A&B, COVID) ARPGX2  CULTURE, BLOOD (ROUTINE X 2)  CULTURE, BLOOD (ROUTINE X 2)  URINE CULTURE  LACTIC ACID, PLASMA  LACTIC ACID, PLASMA  PROTIME-INR  APTT   ____________________________________________   EKG  Interpreted by  me Atrial fibrillation, rate of 53, left axis, right bundle branch block.  ST elevation in 2 3 aVF with ST depression and T wave inversion in 1 aVL V2 V3.  This is unchanged compared to previous EKG on August 06, 2020 and previous.  Posterior leads were obtained on EKG time 16:05:30 on leads V4 through V6.  These are negative for STEMI.  ____________________________________________    RADIOLOGY  DG Chest Port 1 View  Result Date: 08/22/2020 CLINICAL DATA:  Sepsis. EXAM: PORTABLE CHEST 1 VIEW COMPARISON:  August 05, 2020. FINDINGS: Stable cardiomegaly. No pneumothorax or pleural effusion is noted. Both lungs are clear. The visualized skeletal structures are unremarkable. IMPRESSION: No active disease. Aortic Atherosclerosis  (ICD10-I70.0). Electronically Signed   By: Marijo Conception M.D.   On: 08/22/2020 16:50   CT Angio Chest/Abd/Pel for Dissection W and/or Wo Contrast  Result Date: 08/22/2020 CLINICAL DATA:  Chest pain radiating to the back and neck not feeling good hypotensive EXAM: CT ANGIOGRAPHY CHEST, ABDOMEN AND PELVIS TECHNIQUE: Non-contrast CT of the chest was initially obtained. Multidetector CT imaging through the chest, abdomen and pelvis was performed using the standard protocol during bolus administration of intravenous contrast. Multiplanar reconstructed images and MIPs were obtained and reviewed to evaluate the vascular anatomy. CONTRAST:  164m OMNIPAQUE IOHEXOL 350 MG/ML SOLN COMPARISON:  Chest x-ray 08/22/2020, CT chest 08/30/2019, CT angiography 07/20/2010, CT abdomen pelvis 03/07/2014 FINDINGS: CTA CHEST FINDINGS Cardiovascular: Non contrasted images of the chest demonstrate no acute intramural hematoma. Advanced aortic atherosclerosis. Left-sided aortic arch with common origin of the right brachiocephalic and left common carotid arteries. Calcification and mural thickening at the origins of the left internal carotid and brachiocephalic vessels without occlusion. Heavily calcified and likely stenotic origin of the left subclavian artery. No aneurysm or dissection is seen. Advanced 3 vessel coronary vascular calcification. Small amount of iatrogenic air in the right atrium and right ventricle. Cardiomegaly. No significant pericardial effusion Mediastinum/Nodes: Midline trachea. No thyroid mass. Stable borderline mediastinal lymph nodes, for example precarinal lymph node measures 10 mm. Esophagus unremarkable Lungs/Pleura: No acute consolidation, pleural effusion, or pneumothorax Musculoskeletal: Sternum is intact. No acute or suspicious osseous abnormality. Review of the MIP images confirms the above findings. CTA ABDOMEN AND PELVIS FINDINGS VASCULAR Aorta: Nonaneurysmal aorta without dissection. Moderate aortic  atherosclerosis. Small amount of ulcerated plaque within the abdominal aorta at the level of the renal arteries. No high-grade stenosis or acute occlusion. Celiac: Mild to moderate stenosis slightly distal to the celiac trunk origin. Moderate disease of the splenic artery without aneurysm or occlusion. SMA: Patent at the origin. Moderate calcification and mild mural thickening slightly distal to the origin with mild vascular narrowing. Replaced right hepatic artery arising from the SMA. Mild calcification and mural disease of the distal SMA branch vessels but without occlusion. Renals: Single right and single left renal arteries. Suspect at least moderate stenosis at the origin of the right renal artery. No high-grade stenosis or occlusion of the left renal artery. IMA: Calcification at the origin.  Distal vascular patency Inflow: Advanced atherosclerosis of the distal aorta and iliac vessels. Stent within the left common iliac and external iliac arteries which appears patent. Advanced disease of the internal iliac vessels. Diffusely diseased right external iliac artery. Advanced calcification and mural thickening of the common femoral arteries and proximal superficial femoral arteries. Suspect partially visualized bypass graft on the right which may be occluded. Veins: No obvious venous abnormality within the limitations of this arterial phase study. Review of  the MIP images confirms the above findings. NON-VASCULAR Hepatobiliary: No calcified gallstone. No biliary dilatation. No focal hepatic abnormality. Pancreas: Unremarkable. No pancreatic ductal dilatation or surrounding inflammatory changes. Spleen: Appears slightly enlarged. Adrenals/Urinary Tract: Adrenal glands are within normal limits. Kidneys show no hydronephrosis. The urinary bladder is unremarkable. Stomach/Bowel: The stomach is nonenlarged. There is no dilated small bowel. No acute bowel wall thickening. Lymphatic: Prominent bilateral inguinal lymph  nodes. Incompletely visualized enlarged 17 mm left inguinal lymph node, series 6 image number 210 Reproductive: Prostate is unremarkable. Other: Negative for free air or free fluid. Fat containing left inguinal hernia. Evidence of prior ventral and periumbilical hernia repair with soft tissue thickening presumably due to scar tissue. Musculoskeletal: No acute or suspicious osseous abnormality. Review of the MIP images confirms the above findings. IMPRESSION: 1. Negative for acute aortic dissection, aneurysm, or acute intramural hematoma. 2. Cardiomegaly with advanced coronary vascular disease. 3. No CT evidence for acute intrathoracic, intra-abdominal, or intrapelvic abnormality. 4. Advanced atherosclerotic vascular disease as described above. 5. Mild splenomegaly 6. Incompletely visualized slightly enlarged left inguinal lymph node, mildly increased in size compared to 2015 study Aortic Atherosclerosis (ICD10-I70.0). Electronically Signed   By: Donavan Foil M.D.   On: 08/22/2020 19:06    ____________________________________________   PROCEDURES .Critical Care Performed by: Carrie Mew, MD Authorized by: Carrie Mew, MD   Critical care provider statement:    Critical care time (minutes):  35   Critical care time was exclusive of:  Separately billable procedures and treating other patients   Critical care was necessary to treat or prevent imminent or life-threatening deterioration of the following conditions:  Sepsis, shock and cardiac failure   Critical care was time spent personally by me on the following activities:  Development of treatment plan with patient or surrogate, discussions with consultants, evaluation of patient's response to treatment, examination of patient, obtaining history from patient or surrogate, ordering and performing treatments and interventions, ordering and review of laboratory studies, ordering and review of radiographic studies, pulse oximetry, re-evaluation  of patient's condition and review of old charts    ____________________________________________  DIFFERENTIAL DIAGNOSIS   Cellulitis, sepsis, non-STEMI, heart failure exacerbation, aortic dissection, thoracic aortic aneurysm, carotid dissection, dehydration, electrolyte abnormality, less likely pulmonary embolism  CLINICAL IMPRESSION / ASSESSMENT AND PLAN / ED COURSE  Medications ordered in the ED: Medications  sodium chloride 0.9 % bolus 500 mL (0 mLs Intravenous Stopped 08/22/20 1801)  ceFEPIme (MAXIPIME) 2 g in sodium chloride 0.9 % 100 mL IVPB (0 g Intravenous Stopped 08/22/20 1801)  metroNIDAZOLE (FLAGYL) IVPB 500 mg (0 mg Intravenous Stopped 08/22/20 1801)  vancomycin (VANCOCIN) IVPB 1000 mg/200 mL premix (0 mg Intravenous Stopped 08/22/20 1801)  atropine 1 MG/10ML injection 0.5 mg (0.5 mg Intravenous Given 08/22/20 1637)  iohexol (OMNIPAQUE) 350 MG/ML injection 100 mL (100 mLs Intravenous Contrast Given 08/22/20 1804)  sodium chloride 0.9 % bolus 500 mL (500 mLs Intravenous New Bag/Given 08/22/20 2010)    Pertinent labs & imaging results that were available during my care of the patient were reviewed by me and considered in my medical decision making (see chart for details).  TASHA BUSSIERE was evaluated in Emergency Department on 08/22/2020 for the symptoms described in the history of present illness. He was evaluated in the context of the global COVID-19 pandemic, which necessitated consideration that the patient might be at risk for infection with the SARS-CoV-2 virus that causes COVID-19. Institutional protocols and algorithms that pertain to the evaluation of patients at  risk for COVID-19 are in a state of rapid change based on information released by regulatory bodies including the CDC and federal and state organizations. These policies and algorithms were followed during the patient's care in the ED.   Patient presents with hypotension, bradycardia, radiating nonpleuritic chest pain.   Will check labs, obtain cultures and give IV antibiotics, obtain chest x-ray, obtain CT scan of the chest.  With suspected infection and hypotension, sepsis bundle recommends 30 mL/kg bolus which would be more than 2 L IV fluid bolus for this patient.  Engaged in shared decision-making with him, and in light of his severe heart failure, he refuses large IV fluid bolus.  He is okay with 500 mL IV fluid trial for now.  Clinical Course as of 08/22/20 2138  Tue Aug 22, 2020  1645 Chest x-ray viewed and interpreted by me, appears unremarkable.  Some scattered groundglass infiltrates without clear explanation for the patient's symptoms.  We will proceed with CT angiogram. [PS]  1800 Heart rate 60, still hypotensive.  Going to CT now.  Antibiotics finished, initial 500 mL bolus is finished.  No worsening of shortness of breath.  Will give additional 500 mL bolus.  If no improvement in blood pressure, may need to start dobutamine. [PS]  2116 Work-up negative, lactate normal, troponin better than chronic baseline.  Urinalysis negative for infection, chest imaging negative for signs of pneumonia.  Not in florid heart failure, denies acute lower extremity symptoms, unlikely cellulitis or osteomyelitis.  I think patient inadvertently got over diuresed from his home diuretic use as evidenced by slight increase in creatinine and decrease in electrolytes.  He has been given cautious IV fluids in the ED with resolution of his hypotension.  He is feeling better, declines admission, and I think stable for discharge home with return precautions to follow-up in the heart failure clinic this week. [PS]    Clinical Course User Index [PS] Carrie Mew, MD    ----------------------------------------- 9:38 PM on 08/22/2020 -----------------------------------------  Sitting upright, eating, feels back to normal and eager to go home.  He is agreeable with follow-up in the heart failure clinic in the next 1 to 2 days,  return to ED if any worsening symptoms.   ____________________________________________   FINAL CLINICAL IMPRESSION(S) / ED DIAGNOSES    Final diagnoses:  Dehydration  Chronic systolic congestive heart failure (HCC)  Acquired absence of right leg below knee [facility])  Morbid obesity [facility])     ED Discharge Orders    None      Portions of this note were generated with dragon dictation software. Dictation errors may occur despite best attempts at proofreading.   Carrie Mew, MD 08/22/20 2138    Carrie Mew, MD 09/21/20 2356

## 2020-08-22 NOTE — Consult Note (Signed)
PHARMACY -  BRIEF ANTIBIOTIC NOTE   Pharmacy has received consult(s) for sepsisl from an ED provider.  The patient's profile has been reviewed for ht/wt/allergies/indication/available labs.    One time order(s) placed for cefepime/vancomycin/flagyl  Further antibiotics/pharmacy consults should be ordered by admitting physician if indicated.                       Thank you, Oswald Hillock 08/22/2020  4:14 PM

## 2020-08-22 NOTE — ED Notes (Signed)
Posterior EKG obtained per EDP verbal order.

## 2020-08-24 LAB — URINE CULTURE: Culture: NO GROWTH

## 2020-08-27 LAB — CULTURE, BLOOD (ROUTINE X 2)
Culture: NO GROWTH
Culture: NO GROWTH
Special Requests: ADEQUATE

## 2020-08-31 ENCOUNTER — Ambulatory Visit: Payer: Medicare Other | Admitting: Family

## 2020-08-31 ENCOUNTER — Encounter: Payer: Self-pay | Admitting: Family

## 2020-08-31 ENCOUNTER — Other Ambulatory Visit: Payer: Self-pay

## 2020-08-31 ENCOUNTER — Ambulatory Visit: Payer: Medicare Other | Attending: Family | Admitting: Family

## 2020-08-31 VITALS — BP 104/60 | HR 62 | Resp 18 | Ht 67.0 in | Wt 189.5 lb

## 2020-08-31 DIAGNOSIS — E785 Hyperlipidemia, unspecified: Secondary | ICD-10-CM | POA: Diagnosis not present

## 2020-08-31 DIAGNOSIS — Z7901 Long term (current) use of anticoagulants: Secondary | ICD-10-CM | POA: Diagnosis not present

## 2020-08-31 DIAGNOSIS — E1151 Type 2 diabetes mellitus with diabetic peripheral angiopathy without gangrene: Secondary | ICD-10-CM | POA: Diagnosis not present

## 2020-08-31 DIAGNOSIS — L97909 Non-pressure chronic ulcer of unspecified part of unspecified lower leg with unspecified severity: Secondary | ICD-10-CM

## 2020-08-31 DIAGNOSIS — F17210 Nicotine dependence, cigarettes, uncomplicated: Secondary | ICD-10-CM | POA: Insufficient documentation

## 2020-08-31 DIAGNOSIS — Z8249 Family history of ischemic heart disease and other diseases of the circulatory system: Secondary | ICD-10-CM | POA: Diagnosis not present

## 2020-08-31 DIAGNOSIS — I110 Hypertensive heart disease with heart failure: Secondary | ICD-10-CM | POA: Diagnosis not present

## 2020-08-31 DIAGNOSIS — E1140 Type 2 diabetes mellitus with diabetic neuropathy, unspecified: Secondary | ICD-10-CM | POA: Insufficient documentation

## 2020-08-31 DIAGNOSIS — I5042 Chronic combined systolic (congestive) and diastolic (congestive) heart failure: Secondary | ICD-10-CM | POA: Insufficient documentation

## 2020-08-31 DIAGNOSIS — J449 Chronic obstructive pulmonary disease, unspecified: Secondary | ICD-10-CM | POA: Insufficient documentation

## 2020-08-31 DIAGNOSIS — Z955 Presence of coronary angioplasty implant and graft: Secondary | ICD-10-CM | POA: Diagnosis not present

## 2020-08-31 DIAGNOSIS — I509 Heart failure, unspecified: Secondary | ICD-10-CM | POA: Diagnosis present

## 2020-08-31 DIAGNOSIS — I5022 Chronic systolic (congestive) heart failure: Secondary | ICD-10-CM

## 2020-08-31 DIAGNOSIS — I2510 Atherosclerotic heart disease of native coronary artery without angina pectoris: Secondary | ICD-10-CM | POA: Diagnosis not present

## 2020-08-31 DIAGNOSIS — I10 Essential (primary) hypertension: Secondary | ICD-10-CM

## 2020-08-31 DIAGNOSIS — I255 Ischemic cardiomyopathy: Secondary | ICD-10-CM | POA: Diagnosis not present

## 2020-08-31 DIAGNOSIS — E1159 Type 2 diabetes mellitus with other circulatory complications: Secondary | ICD-10-CM

## 2020-08-31 DIAGNOSIS — Z7951 Long term (current) use of inhaled steroids: Secondary | ICD-10-CM | POA: Diagnosis not present

## 2020-08-31 DIAGNOSIS — G4733 Obstructive sleep apnea (adult) (pediatric): Secondary | ICD-10-CM | POA: Diagnosis not present

## 2020-08-31 DIAGNOSIS — I70299 Other atherosclerosis of native arteries of extremities, unspecified extremity: Secondary | ICD-10-CM

## 2020-08-31 DIAGNOSIS — Z720 Tobacco use: Secondary | ICD-10-CM

## 2020-08-31 DIAGNOSIS — Z89511 Acquired absence of right leg below knee: Secondary | ICD-10-CM | POA: Insufficient documentation

## 2020-08-31 NOTE — Progress Notes (Signed)
Patient ID: [name] [name], male    DOB: [birth year], 60 y.o.   MRN: [id number]  HPI  [name] [name] is a 60 y/o male with a history of obstructive sleep apnea, PVD, PAD, HTN, hyperlipidemia, DM, CAD, COPD, BPH, asthma, current tobacco use and chronic heart failure.   Echo report from 06/17/20 reviewed and showed an EF of <20% along with elevated PA pressure of 53.2 mmHg and mild/ moderate TR. Echo report from 01/20/20 reviewed and showed an EF of <20% along with mild [name] and moderate TR. Echo report from 09/29/19 reviewed and showed an EF of 20-25% along with moderately elevated PA pressure, mild [name] and mild/moderate TR. Echo report from 11/06/2018 reviewed and showed an EF of 20-25% along with moderate TR and a PA pressure of 59.6 mmHg. Echo report from 04/25/17 reviewed and shows an EF of 25-30% along with a PA pressure of 34 mm Hg. EF has declined from 55-60% back in 2016.   LHC done 01/20/20 showed:  Prox RCA-1 lesion is 80% stenosed.  Prox RCA-2 lesion is 20% stenosed.  Prox RCA to Mid RCA lesion is 80% stenosed.  Prox LAD lesion is 20% stenosed.  1st Diag lesion is 80% stenosed.  Mid LAD lesion is 20% stenosed.  Dist LAD lesion is 60% stenosed.  Prox Cx to Mid Cx lesion is 90% stenosed.  LPAV lesion is 95% stenosed.   1. Significant underlying three-vessel coronary artery disease. Patent proximal LAD stent with mild in-stent restenosis. Moderate diffuse disease in the distal LAD. Significant stenosis in the proximal left circumflex at the origin of the posterior AV groove artery which is also heavily diseased at the ostium. This is a bifurcation lesion and heavily calcified. RCA stent is patent. However, there is significant proximal disease in the whole mid to distal segment is diffusely diseased and small caliber. 2. Left ventricular angiography was not performed. EF was severe reduced by echo. 3. Moderately elevated left ventricular end-diastolic pressure at 28 mmHg  Cardiac  catheterization done 04/28/17 showed significant three-vessel disease with a patient stent in the RCA and LAD. Significant proximal RCS disease and the stent as well as diffuse mid and distal disease. LAD has moderate disease. Severely elevated left ventricular end-diastolic pressure at 34 mmHg. Possible CABG in the future with optimizing medical management. Stress done in 2015.  Was in the ED 08/22/20 due to upper back and shoulder pain. Hypotensive so IVF given along with antibiotics. Symptoms improved and he was released. Admitted 08/05/20 due to Osteomyelitis of the left second toe with diabetic foot infection and cellulitis. Podiatry and cardiology consults obtained. Amputation of the left second toe performed on 08/11/2020. Given IV antibiotics and then transitioned to oral medications. Given IV lasix due to HF exacerbation and then switched to oral diuretics.         Discharged after 7 days. Admitted 06/17/20 due to HF exacerbation. Placed on milrinone and Lasix infusion and then transitioned to oral diuretics. Nephrology, cardiology, palliative care and wound consults obtained. Discharged after 14 days. Was in the ED 03/26/20 due to right shoulder pain where he was evaluated and released.   He presents today for a follow-up visit with a chief complaint of minimal shortness of breath upon moderate exertion. He describes this as chronic in nature having been present for several years. He has associated fatigue, cough, wheezing, light-headedness, intermittent headaches, easy bruising and chronic pain along with this. He denies any difficulty sleeping, abdominal distention, palpitations, pedal edema, chest  pain or weight gain.   He does note concern about the left side of his throat hurting when he swallows. Denies having any difficulty swallowing just the pain. Says that this has been present for ~ one week.   Overall he says that he feels "great" every since having his toe amputated and the infection  treated. Currently on an antibiotic "for my chest".   Past Medical History:  Diagnosis Date  . Acute on chronic heart failure ([place])   . AKI (acute kidney injury) ([place]) 01/03/2018  . Angina at rest [facility]) 08/13/2017  . Arthritis   . Asthma   . Atherosclerosis   . BPH (benign prostatic hyperplasia)   . Carotid arterial disease ([place])   . Cellulitis of foot, left 09/27/2019  . Charcot's joint of foot, right   . Chronic combined systolic (congestive) and diastolic (congestive) heart failure ([place])    a. 02/2013 EF 50% by LV gram; b. 04/2017 Echo: EF 25-30%. diff HK. Gr2 DD. Mod dil LA/RV. PASP 26mHg.  .Marland KitchenChronic foot pain (Secondary Area of Pain) (Right) 01/29/2018  . COPD (chronic obstructive pulmonary disease) ([place]   . COPD with acute exacerbation ([place] 01/30/2020  . Coronary artery disease    a. 2013 S/P PCI of LAD ([facility];  b. 03/2013 PCI: RCA 90p ( 2.5 x 23 mm DES); c. 02/2014 Cath: patent RCA stent->Med Rx; d. 04/2017 Cath: LM nl, LAd 30ost/p, 655m, D1 80ost, LCX 90p/m, OM1 85, RCA 70/20p, 8061m0d->Referred for CT Surg-felt to be poor candidate.  . Coronary artery disease of native artery of native heart with stable angina pectoris (HCCStick[place]/08/2017   [facility]. Nuclear stress test showed inferior wall defect. Cardiac catheterization showed patent LAD stent with 90% stenosis in the proximal RCA. Ejection fraction was 50%. Status post PCI of the RCA with a 2.5 x 23 mm drug-eluting stent  . Diabetic neuropathy (HCCWailua Homesteads . Diabetic ulcer of right foot (HCCGood[place] . Hernia   . HFrEF (heart failure with reduced ejection fraction) (HCCEast [place]/22/2021  . Hyperlipidemia   . Hypertension   . Ischemic cardiomyopathy    a. 04/2017 Echo: EF 25-30%; b. 08/2017 Cardiac MRI ([facility]): EF 19%, sev glob HK. RVEF 20%, mod BAE, triv [name], mild-mod TR. Basal lateral subendocardial infarct (viable), inf/infsept, dist septal ischemia, basal to mid lat  peri-infarct ischemia.  . Morbid obesity (HCCCedar [place] . Neuropathy   . PAD (peripheral artery disease) (HCCFonda  a. Followed by Dr. DewLucky Cowboy. 08/2010 Periph Angio: RSFA 70-80p (6X60 self-expanding stent); c. 09/2010 Periph Angio: L SFA 100p (7x unknown length self-expanding stent); d. 06/2015 Periph Angio: R SFA short segment occlusion (6x12 self-expanding stent).  . Restless leg syndrome   . Sepsis (HCCShingletown0/21/2019  . Sleep apnea   . Subclavian artery stenosis, right (HCCNorth Spearfish . Syncope and collapse   . Tobacco use    a. 75+ yr hx - still smoking 1ppd, down from 2 ppd.  . Type II diabetes mellitus (HCCSelma . Unstable angina (HCCKooten[place]/06/2016  . Varicose vein    Past Surgical History:  Procedure Laterality Date  . ABDOMINAL AORTAGRAM N/A 06/23/2013   Procedure: ABDOMINAL AORMaxcine HamSurgeon: MuhWellington HampshireD;  [facility];  Service: Cardiovascular;  Laterality: N/A;  . AMPUTATION Right 04/01/2018   Procedure: AMPUTATION BELOW KNEE;  Surgeon: DewAlgernon Huxley  MD;  Location: [facility] ORS;  Service: Vascular;  Laterality: Right;  . AMPUTATION TOE Left 08/11/2020   Procedure: AMPUTATION TOE-2nd Toe;  Surgeon: Samara Deist, DPM;  Location: [facility] ORS;  Service: Podiatry;  Laterality: Left;  . APPENDECTOMY    . CARDIAC CATHETERIZATION  10/14   [facility]; X1 STENT PROXIMAL RCA  . CARDIAC CATHETERIZATION  02/2010   [facility]  . CARDIAC CATHETERIZATION  02/21/2014   [facility]  . CLAVICLE SURGERY    . HERNIA REPAIR    . IRRIGATION AND DEBRIDEMENT FOOT Right 01/04/2018   Procedure: IRRIGATION AND DEBRIDEMENT FOOT;  Surgeon: Samara Deist, DPM;  Location: [facility] ORS;  Service: Podiatry;  Laterality: Right;  . LEFT HEART CATH AND CORONARY ANGIOGRAPHY N/A 01/20/2020   Procedure: LEFT HEART CATH AND CORONARY ANGIOGRAPHY;  Surgeon: Wellington Hampshire, MD;  Location: [place] CV LAB;  Service: Cardiovascular;  Laterality: N/A;  . LEFT HEART CATH AND CORS/GRAFTS ANGIOGRAPHY N/A 04/28/2017   Procedure: LEFT HEART CATH AND  CORONARY ANGIOGRAPHY;  Surgeon: Wellington Hampshire, MD;  Location: [place] CV LAB;  Service: Cardiovascular;  Laterality: N/A;  . LOWER EXTREMITY ANGIOGRAPHY Right 03/03/2018   Procedure: LOWER EXTREMITY ANGIOGRAPHY;  Surgeon: Katha Cabal, MD;  Location: [place] CV LAB;  Service: Cardiovascular;  Laterality: Right;  . LOWER EXTREMITY ANGIOGRAPHY Left 08/24/2018   Procedure: LOWER EXTREMITY ANGIOGRAPHY;  Surgeon: Algernon Huxley, MD;  Location: [place] CV LAB;  Service: Cardiovascular;  Laterality: Left;  . LOWER EXTREMITY ANGIOGRAPHY Left 06/14/2019   Procedure: LOWER EXTREMITY ANGIOGRAPHY;  Surgeon: Algernon Huxley, MD;  Location: [place] CV LAB;  Service: Cardiovascular;  Laterality: Left;  . LOWER EXTREMITY ANGIOGRAPHY Left 08/02/2019   Procedure: LOWER EXTREMITY ANGIOGRAPHY;  Surgeon: Algernon Huxley, MD;  Location: [place] CV LAB;  Service: Cardiovascular;  Laterality: Left;  . LOWER EXTREMITY ANGIOGRAPHY Left 07/17/2020   Procedure: LOWER EXTREMITY ANGIOGRAPHY;  Surgeon: Algernon Huxley, MD;  Location: [place] CV LAB;  Service: Cardiovascular;  Laterality: Left;  . PERIPHERAL ARTERIAL STENT GRAFT     x2 left/right  . PERIPHERAL VASCULAR BALLOON ANGIOPLASTY Right 01/06/2018   Procedure: PERIPHERAL VASCULAR BALLOON ANGIOPLASTY;  Surgeon: Katha Cabal, MD;  Location: [place] CV LAB;  Service: Cardiovascular;  Laterality: Right;  . PERIPHERAL VASCULAR CATHETERIZATION N/A 07/06/2015   Procedure: Abdominal Aortogram w/Lower Extremity;  Surgeon: Algernon Huxley, MD;  Location: [place] CV LAB;  Service: Cardiovascular;  Laterality: N/A;  . PERIPHERAL VASCULAR CATHETERIZATION  07/06/2015   Procedure: Lower Extremity Intervention;  Surgeon: Algernon Huxley, MD;  Location: [place] CV LAB;  Service: Cardiovascular;;   Family History  Problem Relation Age of Onset  . Heart attack Father   . Heart disease Father   . Hypertension Father   . Hyperlipidemia Father    . Hypertension Mother   . Hyperlipidemia Mother    Social History   Tobacco Use  . Smoking status: Current Every Day Smoker    Packs/day: 1.00    Years: 41.00    Pack years: 41.00    Types: Cigarettes  . Smokeless tobacco: Never Used  Substance Use Topics  . Alcohol use: No   Allergies  Allergen Reactions  . Dulaglutide Anaphylaxis, Diarrhea and Hives  . Ace Inhibitors     hypotension  . Other Itching    Skin itching associated with nitro patch  . Prednisone Rash   Prior to Admission medications   Medication Sig Start Date End Date Taking?  Authorizing Provider  albuterol (VENTOLIN HFA) 108 (90 Base) MCG/ACT inhaler Inhale into the lungs every 6 (six) hours as needed for wheezing or shortness of breath.   Yes [provider]  atorvastatin (LIPITOR) 80 MG tablet Take 80 mg by mouth daily.   Yes [provider]  carvedilol (COREG) 3.125 MG tablet Take 1 tablet (3.125 mg total) by mouth 2 (two) times daily with a meal. 07/01/20  Yes Shawna Clamp, MD  cephALEXin (KEFLEX) 500 MG capsule Take 1 capsule by mouth 4 (four) times daily. 08/22/20 09/01/20 Yes [provider]  clopidogrel (PLAVIX) 75 MG tablet Take 75 mg by mouth daily.   Yes [provider]  digoxin (LANOXIN) 0.125 MG tablet Take 1 tablet (0.125 mg total) by mouth daily. 08/12/20  Yes Wieting, Richard, MD  DULoxetine (CYMBALTA) 30 MG capsule Take 30 mg by mouth daily.    Yes [provider]  ELIQUIS 5 MG TABS tablet Take 5 mg by mouth 2 (two) times daily. 02/02/20  Yes [provider]  empagliflozin (JARDIANCE) 10 MG TABS tablet Take 10 mg by mouth daily.  02/03/20  Yes [provider]  ezetimibe (ZETIA) 10 MG tablet Take 10 mg by mouth daily.   Yes [provider]  gabapentin (NEURONTIN) 800 MG tablet Take 800 mg by mouth 4 (four) times daily.  08/14/17 04/04/29 Yes [provider]  ipratropium-albuterol (DUONEB) 0.5-2.5 (3) MG/3ML SOLN Inhale 3 mLs  into the lungs every 6 (six) hours as needed (wheezing/sob).    Yes [provider]  isosorbide mononitrate (IMDUR) 30 MG 24 hr tablet Take 30 mg by mouth daily.   Yes [provider]  losartan (COZAAR) 25 MG tablet Take 0.5 tablets (12.5 mg total) by mouth daily. 08/13/20  Yes Wieting, Richard, MD  Multiple Vitamin (MULTI-VITAMINS) TABS Take 1 tablet by mouth daily.    Yes [provider]  nitroGLYCERIN (NITROSTAT) 0.4 MG SL tablet Place 1 tablet (0.4 mg total) under the tongue every 5 (five) minutes as needed for chest pain. 05/19/19  Yes Marvalene Barrett, Otila Kluver A, FNP  omeprazole (PRILOSEC) 40 MG capsule Take 40 mg by mouth daily.   Yes [provider]  Oxycodone HCl 10 MG TABS Take 10 mg by mouth every 6 (six) hours as needed (pain).  01/15/18  Yes [provider]  polyethylene glycol (MIRALAX / GLYCOLAX) 17 g packet Take 17 g by mouth daily as needed for mild constipation. 08/12/20  Yes Wieting, Richard, MD  potassium chloride (KLOR-CON) 10 MEQ tablet Take 10 mEq by mouth 2 (two) times daily.   Yes [provider]  spironolactone (ALDACTONE) 25 MG tablet Take 0.5 tablets (12.5 mg total) by mouth daily. 07/02/20  Yes Shawna Clamp, MD  SYMBICORT 80-4.5 MCG/ACT inhaler Inhale 2 puffs into the lungs daily.  01/01/20  Yes [provider]  tamsulosin (FLOMAX) 0.4 MG CAPS capsule Take 1 capsule (0.4 mg total) by mouth daily after supper. 07/01/20  Yes Shawna Clamp, MD  torsemide (DEMADEX) 20 MG tablet Take 2 tablets (40 mg total) by mouth daily. 07/01/20  Yes Shawna Clamp, MD  traZODone (DESYREL) 150 MG tablet Take 150 mg by mouth at bedtime. 07/12/19  Yes [provider]  Vericiguat 5 MG TABS Take 5 mg by mouth daily.   Yes [provider]    Review of Systems  Constitutional: Positive for fatigue. Negative for appetite change and fever.  HENT: Positive for rhinorrhea and sore throat (for the last week). Negative  for congestion,  postnasal drip and trouble swallowing.   Eyes: Negative.   Respiratory: Positive for cough, shortness of breath ("little bit") and wheezing. Negative for chest tightness.   Cardiovascular: Negative for chest pain, palpitations and leg swelling.  Gastrointestinal: Negative for abdominal distention and abdominal pain.  Endocrine: Negative.   Genitourinary: Negative.   Musculoskeletal: Positive for arthralgias (right shoulder) and back pain. Negative for neck pain.       Has right lower leg prosthesis  Skin: Positive for wound (left foot).  Allergic/Immunologic: Negative.   Neurological: Positive for light-headedness and headaches. Negative for dizziness.  Hematological: Negative for adenopathy. Bruises/bleeds easily.  Psychiatric/Behavioral: Negative for dysphoric mood and sleep disturbance (sleeping on 2 pillows). The patient is not nervous/anxious.    Vitals:   08/31/20 1137  BP: 104/60  Pulse: 62  Resp: 18  SpO2: 92%  Weight: 189 lb 8 oz (86 kg)  Height: '5\' 7"'$  (1.702 m)   Wt Readings from Last 3 Encounters:  08/31/20 189 lb 8 oz (86 kg)  08/22/20 160 lb (72.6 kg)  08/12/20 186 lb 12.8 oz (84.7 kg)   Lab Results  Component Value Date   CREATININE 1.50 (H) 08/22/2020   CREATININE 1.23 08/12/2020   CREATININE 1.17 08/11/2020    Physical Exam Vitals and nursing note reviewed.  Constitutional:      Appearance: He is well-developed.  HENT:     Head: Normocephalic and atraumatic.  Neck:     Vascular: No JVD.  Cardiovascular:     Rate and Rhythm: Regular rhythm. Bradycardia present.  Pulmonary:     Effort: Pulmonary effort is normal.     Breath sounds: No wheezing, rhonchi or rales.  Abdominal:     General: There is no distension.     Palpations: Abdomen is soft.     Tenderness: There is no abdominal tenderness.  Musculoskeletal:        General: Deformity (right leg prosthesis present) present. No tenderness.     Cervical back: Normal range of motion and neck supple.      Left lower leg: No tenderness. No edema.  Skin:    General: Skin is warm and dry.     Findings: Erythema (left lower leg) present.  Neurological:     Mental Status: He is alert and oriented to person, place, and time.  Psychiatric:        Behavior: Behavior normal.        Thought Content: Thought content normal.     Assessment & Plan:  1: Chronic heart failure with reduced ejection fraction- - NYHA class II - euvolemic today - weighing daily; reminded to call for an overnight weight gain of >2 pounds or a weekly weight gain of >5 pounds - weight up 4 pounds from last visit 6 weeks ago - saw cardiology Kathlen Mody) 07/07/20 - palliative care visit done 08/03/20 - BNP 08/08/20 was 1395.9 - participating in paramedicine program  - not interested in COVID vaccine at this time  2: HTN- - BP looks good today although on the low side (104/60) - had telemedicine visit with PCP Dr. Jodi Mourning at [facility] primary care 07/12/20 - BMP done 08/22/20 reviewed and shows sodium 133, potassium 3.4, creatinine 1.5 and GFR 54  3: Tobacco use- - currently smoking ~ 3 cigarettes daily - complete cessation discussed for 3 minutes with him  4: Severe atherosclerosis of lower extremities- - had left lower leg angiogram 07/17/20 - saw vascular Owens Shark) 07/05/20 - has already had right  BKA - 2nd toe amputation performed 08/11/20  5: Diabetes- - A1c 08/06/20 was 6.5% - home glucose today was 103 - saw podiatry Vickki Muff) 08/30/20  Advised him to f/u with PCP regarding left sided neck pain when he swallows. If he develops difficulty swallowing, he needs to go to the ED and patient voices understanding of that.    Patient did not bring his medications nor a list. Each medication was verbally reviewed with the patient and he was encouraged to bring the bottles to every visit to confirm accuracy of list.   Return in 3 months or sooner for any questions/problems before then.

## 2020-08-31 NOTE — Patient Instructions (Signed)
Continue weighing daily and call for an overnight weight gain of > 2 pounds or a weekly weight gain of >5 pounds. 

## 2020-09-04 ENCOUNTER — Encounter (INDEPENDENT_AMBULATORY_CARE_PROVIDER_SITE_OTHER): Payer: Medicare Other

## 2020-09-05 ENCOUNTER — Other Ambulatory Visit (HOSPITAL_COMMUNITY): Payer: Self-pay

## 2020-09-05 ENCOUNTER — Encounter (HOSPITAL_COMMUNITY): Payer: Self-pay

## 2020-09-05 NOTE — Progress Notes (Signed)
Today had a home visit with Riel.  He states doing good.  He is smiling and appears to be feeling good. Mood is good.  He states just going to live life.  He knows his heart is bad and no surgery option.  He said partly his fault he did not take his medical issues serious for a long time.  He is walking some, foot is healing.  Leg looks a lot better, he has taken the wrap off of it.  He has appt with doctor next week.  He is aware of up coming appts.  His wife places his meds in a box for him.  Verified his meds.  Abdomen is soft, he states fluid has been staying off.  He has been taking all his medications, he states urinates a lot.  He states been going a lot doing things.  He is trying to not let stress get to him and his wife.  He denies any chest pain, headaches, dizziness or increased of shortness of breath.  Still smoking.  Has been watching his diet better with lower sodium foods and watching his fluids.  He has cut back on [place].  Will continue to visit for heart failure.  He denies needing anything today.   [place] [phone]

## 2020-09-27 ENCOUNTER — Telehealth (INDEPENDENT_AMBULATORY_CARE_PROVIDER_SITE_OTHER): Payer: Self-pay | Admitting: Vascular Surgery

## 2020-09-27 NOTE — Telephone Encounter (Signed)
Patient can be schedule to come in tomorrow for unna wrap

## 2020-09-27 NOTE — Telephone Encounter (Signed)
The physical therapists from [facility] called in regards to patient. Blister on left left oozing clear fluid, red.  Started yesterday.  Patient last visit was supposed to be for a nurse visit unnaboot, but was a no show. Please advise.

## 2020-09-27 NOTE — Telephone Encounter (Signed)
Patient scheduled for 09/28/2020 at 2:pm.

## 2020-09-28 ENCOUNTER — Other Ambulatory Visit: Payer: Self-pay | Admitting: Family

## 2020-09-28 ENCOUNTER — Ambulatory Visit (INDEPENDENT_AMBULATORY_CARE_PROVIDER_SITE_OTHER): Payer: Medicare Other | Admitting: Nurse Practitioner

## 2020-09-28 ENCOUNTER — Other Ambulatory Visit: Payer: Self-pay

## 2020-09-28 ENCOUNTER — Ambulatory Visit
Admission: RE | Admit: 2020-09-28 | Discharge: 2020-09-28 | Disposition: A | Discharge status: Home / Self Care | Payer: Medicare Other | Source: Ambulatory Visit | Attending: Family | Admitting: Family

## 2020-09-28 VITALS — BP 97/58 | HR 91 | Ht 67.0 in | Wt 195.0 lb

## 2020-09-28 DIAGNOSIS — I83029 Varicose veins of left lower extremity with ulcer of unspecified site: Secondary | ICD-10-CM | POA: Diagnosis not present

## 2020-09-28 DIAGNOSIS — I5043 Acute on chronic combined systolic (congestive) and diastolic (congestive) heart failure: Secondary | ICD-10-CM

## 2020-09-28 DIAGNOSIS — L97929 Non-pressure chronic ulcer of unspecified part of left lower leg with unspecified severity: Secondary | ICD-10-CM

## 2020-09-28 LAB — BASIC METABOLIC PANEL
Anion gap: 7 (ref 5–15)
BUN: 23 mg/dL — ABNORMAL HIGH (ref 6–20)
CO2: 30 mmol/L (ref 22–32)
Calcium: 8.9 mg/dL (ref 8.9–10.3)
Chloride: 96 mmol/L — ABNORMAL LOW (ref 98–111)
Creatinine, Ser: 1.24 mg/dL (ref 0.61–1.24)
GFR, Estimated: >60 mL/min (ref >60)
Glucose, Bld: 137 mg/dL — ABNORMAL HIGH (ref 70–99)
Potassium: 4.1 mmol/L (ref 3.5–5.1)
Sodium: 133 mmol/L — ABNORMAL LOW (ref 135–145)

## 2020-09-28 LAB — BRAIN NATRIURETIC PEPTIDE: B Natriuretic Peptide: 1131.5 pg/mL — ABNORMAL HIGH (ref 0.0–100.0)

## 2020-09-28 MED ORDER — FUROSEMIDE 10 MG/ML IJ SOLN
80.0000 mg | Freq: Once | INTRAMUSCULAR | Status: AC
  Administered 2020-09-28: 80 mg via INTRAVENOUS

## 2020-09-28 MED ORDER — POTASSIUM CHLORIDE CRYS ER 20 MEQ PO TBCR
40.0000 meq | EXTENDED_RELEASE_TABLET | Freq: Once | ORAL | Status: AC
  Administered 2020-09-28: 40 meq via ORAL

## 2020-09-28 NOTE — Progress Notes (Signed)
History of Present Illness  There is no documented history at this time  Assessments & Plan   There are no diagnoses linked to this encounter.    Additional instructions  Subjective:  Patient presents with venous ulcer of the Left lower extremity.    Procedure:  3 layer unna wrap was placed Left lower extremity.   Plan:   Follow up in one week.  

## 2020-09-28 NOTE — Progress Notes (Signed)
Patient called asking for IV lasix due to worsening swelling in his leg with weeping of fluid. Orders placed and he sees vascular provider later today.

## 2020-09-30 ENCOUNTER — Encounter (INDEPENDENT_AMBULATORY_CARE_PROVIDER_SITE_OTHER): Payer: Self-pay | Admitting: Nurse Practitioner

## 2020-10-03 ENCOUNTER — Telehealth (HOSPITAL_COMMUNITY): Payer: Self-pay

## 2020-10-03 NOTE — Telephone Encounter (Signed)
Today spoke with [facility].  He states been doing good "still kicking".  He states has everything he needs for daily living.  He has all his medications and aware of how to take them.  He states his weight has been staying down and legs have not been swelling.  He denies abdomen feeling full.  He states breathing is good and denies chest pain.  He is aware of up coming appts.  Will make a home visit in a couple of weeks.  Will visit for heart failure and diet.   [place] [phone]

## 2020-10-05 ENCOUNTER — Ambulatory Visit (INDEPENDENT_AMBULATORY_CARE_PROVIDER_SITE_OTHER): Payer: Medicare Other | Admitting: Nurse Practitioner

## 2020-10-05 ENCOUNTER — Encounter (INDEPENDENT_AMBULATORY_CARE_PROVIDER_SITE_OTHER): Payer: Self-pay

## 2020-10-05 ENCOUNTER — Other Ambulatory Visit: Payer: Self-pay

## 2020-10-05 VITALS — BP 103/64 | HR 79 | Resp 16 | Wt 193.6 lb

## 2020-10-05 DIAGNOSIS — L97929 Non-pressure chronic ulcer of unspecified part of left lower leg with unspecified severity: Secondary | ICD-10-CM | POA: Diagnosis not present

## 2020-10-05 DIAGNOSIS — I83029 Varicose veins of left lower extremity with ulcer of unspecified site: Secondary | ICD-10-CM

## 2020-10-05 NOTE — Progress Notes (Signed)
History of Present Illness  There is no documented history at this time  Assessments & Plan   There are no diagnoses linked to this encounter.    Additional instructions  Subjective:  Patient presents with venous ulcer of the Left lower extremity.    Procedure:  3 layer unna wrap was placed Left lower extremity.   Plan:   Follow up in one week.  

## 2020-10-06 ENCOUNTER — Ambulatory Visit
Admission: EM | Admit: 2020-10-06 | Discharge: 2020-10-06 | Disposition: A | Discharge status: Home / Self Care | Payer: Medicare Other | Attending: Sports Medicine | Admitting: Sports Medicine

## 2020-10-06 ENCOUNTER — Other Ambulatory Visit: Payer: Self-pay

## 2020-10-06 DIAGNOSIS — I739 Peripheral vascular disease, unspecified: Secondary | ICD-10-CM

## 2020-10-06 DIAGNOSIS — S80212A Abrasion, left knee, initial encounter: Secondary | ICD-10-CM

## 2020-10-06 DIAGNOSIS — W19XXXA Unspecified fall, initial encounter: Secondary | ICD-10-CM

## 2020-10-06 DIAGNOSIS — Z720 Tobacco use: Secondary | ICD-10-CM | POA: Diagnosis not present

## 2020-10-06 DIAGNOSIS — Y92009 Unspecified place in unspecified non-institutional (private) residence as the place of occurrence of the external cause: Secondary | ICD-10-CM

## 2020-10-06 DIAGNOSIS — Z89511 Acquired absence of right leg below knee: Secondary | ICD-10-CM

## 2020-10-06 DIAGNOSIS — S50312A Abrasion of left elbow, initial encounter: Secondary | ICD-10-CM

## 2020-10-06 DIAGNOSIS — Z716 Tobacco abuse counseling: Secondary | ICD-10-CM

## 2020-10-06 NOTE — Discharge Instructions (Addendum)
Please see educational handouts.

## 2020-10-06 NOTE — Progress Notes (Signed)
Date:  10/09/2020   ID:  [name], DOB 06-Feb-1961, MRN [id number]  Patient Location:  [place] [id number]   Provider location:   [facility], [place] office  PCP:  [name], MD  Cardiologist:  Arvid Right [facility]   Chief Complaint  Patient presents with  . 3 month follow up     Patient c/o shortness of breath and chest pain off & on with over exertion for 3 weeks and has some LE edema. Medications reviewed by the patient verbally.     History of Present Illness:    [name] [name] is a 60 y.o. 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 Lucky Cowboy July 17, 2020 Reports he has Unna boots in place for nonhealing wounds  Compliant with his medications, wife is organizing his medication Weight 193, Down 25 pounds Feeling chest pain, comes and goes, 2-3 min Comes when tired and stress, lasts 2 min Heartburn pill helps  Continues to smoke over 1 pack/day  Lab work reviewed with him A1C 6.4 CR 1.24 Total chol 118, down from 211  EKG personally reviewed by myself on todays visit Shows atrial fibrillation rate 97 bpm right bundle branch block  Prior history reviewed with him  hospital 01/2020, NSTEMI, atrial fib Markedly elevated troponin Hypotensive,  echocardiogram confirming ejection fraction less than 20% with global hypokinesis, Severe PAD Cardiac catheterization showing  severe three-vessel disease, patent proximal LAD stent, moderate distal LAD disease Significant stenosis proximal left circumflex, bifurcation lesion, heavily calcified, RCA stent patent, proximal RCA disease It was felt his cardiomyopathy is out of proportion to his coronary disease Management of his atrial fibrillation and heart failure symptoms felt to be more of a priority It was recommended he have pressors and diuresis Before this could be initiated, he left the hospital [facility] January 20, 2020  Ad[facility] to [facility] 01/29/2020 Treated for CHF, left AMA August 25  Presentation to the hospital May 2020 acute on chronic systolic CHF Treated with Lasix IV, transition to torsemide Toprol XL and spironolactone   with continued -ACEi has previously been held secondary to relative hypotension  LE stent placed 08/2018 Percutaneous transluminal angioplasty of the entire left SFA and above-knee popliteal artery with a 5 mm diameter by 30 cm length and a 5 mm diameter by 22 cm length Lutonix drug-coated angioplasty balloon  Prior CV studies:   The following studies were reviewed today:  2D Echo 11/06/2018: 1. The left ventricle has severely reduced systolic function, with an ejection fraction of 20-25%. The cavity size was moderately dilated. Indeterminate diastolic filling due to E-A fusion. Left ventricular diffuse hypokinesis. 2. The right ventricle has severely reduced systolic function. The cavity was mildly enlarged. There is no increase in right ventricular wall thickness. Right ventricular systolic pressure is moderately elevated with an estimated pressure of 59.6 mmHg.   Past Medical History:  Diagnosis Date  .  Acute on chronic heart failure ([place])   . AKI (acute kidney injury) ([place]) 01/03/2018  . Angina at rest [facility]) 08/13/2017  . Arthritis   . Asthma   . Atherosclerosis   . BPH (benign prostatic hyperplasia)   . Carotid arterial disease ([place])   . Cellulitis of foot, left 09/27/2019  .  Charcot's joint of foot, right   . Chronic combined systolic (congestive) and diastolic (congestive) heart failure ([place])    a. 02/2013 EF 50% by LV gram; b. 04/2017 Echo: EF 25-30%. diff HK. Gr2 DD. Mod dil LA/RV. PASP 65mHg.  .Marland KitchenChronic foot pain (Secondary Area of Pain) (Right) 01/29/2018  . COPD (chronic obstructive pulmonary disease) ([place]   . COPD with acute exacerbation ([place] 01/30/2020  . Coronary artery disease    a. 2013 S/P PCI of LAD ([facility];  b. 03/2013 PCI: RCA 90p ( 2.5 x 23 mm DES); c. 02/2014 Cath: patent RCA stent->Med Rx; d. 04/2017 Cath: LM nl, LAd 30ost/p, 680m, D1 80ost, LCX 90p/m, OM1 85, RCA 70/20p, 8032m0d->Referred for CT Surg-felt to be poor candidate.  . Coronary artery disease of native artery of native heart with stable angina pectoris (HCCEast Globe/08/2017   [facility]. Nuclear stress test showed inferior wall defect. Cardiac catheterization showed patent LAD stent with 90% stenosis in the proximal RCA. Ejection fraction was 50%. Status post PCI of the RCA with a 2.5 x 23 mm drug-eluting stent  . Diabetic neuropathy (HCCPick[place] . Diabetic ulcer of right foot (HCCStoutsville . Hernia   . HFrEF (heart failure with reduced ejection fraction) (HCCVermillion/22/2021  . Hyperlipidemia   . Hypertension   . Ischemic cardiomyopathy    a. 04/2017 Echo: EF 25-30%; b. 08/2017 Cardiac MRI ([facility]): EF 19%, sev glob HK. RVEF 20%, mod BAE, triv MR, mild-mod TR. Basal lateral subendocardial infarct (viable), inf/infsept, dist septal ischemia, basal to mid lat peri-infarct ischemia.  . Morbid obesity (HCCElmer . Neuropathy   . PAD (peripheral artery disease) (HCCFenton  a. Followed by Dr. DewLucky Cowboy. 08/2010 Periph Angio: RSFA 70-80p (6X60 self-expanding stent); c. 09/2010 Periph Angio: L SFA 100p (7x unknown length self-expanding stent); d. 06/2015 Periph Angio: R SFA short segment occlusion (6x12 self-expanding stent).  . Restless leg  syndrome   . Sepsis (HCCSmelterville0/21/2019  . Sleep apnea   . Subclavian artery stenosis, right (HCCOlla . Syncope and collapse   . Tobacco use    a. 75+ yr hx - still smoking 1ppd, down from 2 ppd.  . Type II diabetes mellitus (HCCWrightstown . Unstable angina (HCCBootjack/06/2016  . Varicose vein    Past Surgical History:  Procedure Laterality Date  . ABDOMINAL AORTAGRAM N/A 06/23/2013   Procedure: ABDOMINAL AORMaxcine HamSurgeon: MuhWellington HampshireD;  [facility];  Service: Cardiovascular;  Laterality: N/A;  . AMPUTATION Right 04/01/2018   Procedure: AMPUTATION BELOW KNEE;  Surgeon: DewAlgernon HuxleyD;  Location: [facility] ORS;  Service: Vascular;  Laterality: Right;  . AMPUTATION TOE Left 08/11/2020   Procedure: AMPUTATION TOE-2nd Toe;  Surgeon: FowSamara DeistPM;  Location: [facility] ORS;  Service: Podiatry;  Laterality: Left;  . APPENDECTOMY    . CARDIAC CATHETERIZATION  10/14   [facility]; X1 STENT PROXIMAL RCA  . CARDIAC CATHETERIZATION  02/2010   [facility]  . CARDIAC CAT[facility]ATION  [facility]/2014   [facility]  .  CLAVICLE SURGERY    . HERNIA REPAIR    . IRRIGATION AND DEBRIDEMENT FOOT Right 01/04/2018   Procedure: IRRIGATION AND DEBRIDEMENT FOOT;  Surgeon: Samara Deist, DPM;  Location: [facility] ORS;  Service: Podiatry;  Laterality: Right;  . LEFT HEART CATH AND CORONARY ANGIOGRAPHY N/A 01/20/2020   Procedure: LEFT HEART CATH AND CORONARY ANGIOGRAPHY;  Surgeon: Wellington Hampshire, MD;  Location: [place] CV LAB;  Service: Cardiovascular;  Laterality: N/A;  . LEFT HEART CATH AND CORS/GRAFTS ANGIOGRAPHY N/A 04/28/2017   Procedure: LEFT HEART CATH AND CORONARY ANGIOGRAPHY;  Surgeon: Wellington Hampshire, MD;  Location: [place] CV LAB;  Service: Cardiovascular;  Laterality: N/A;  . LOWER EXTREMITY ANGIOGRAPHY Right 03/03/2018   Procedure: LOWER EXTREMITY ANGIOGRAPHY;  Surgeon: Katha Cabal, MD;  Location: [place] CV LAB;  Service: Cardiovascular;  Laterality: Right;  . LOWER EXTREMITY ANGIOGRAPHY Left  08/24/2018   Procedure: LOWER EXTREMITY ANGIOGRAPHY;  Surgeon: Algernon Huxley, MD;  Location: [place] CV LAB;  Service: Cardiovascular;  Laterality: Left;  . LOWER EXTREMITY ANGIOGRAPHY Left 06/14/2019   Procedure: LOWER EXTREMITY ANGIOGRAPHY;  Surgeon: Algernon Huxley, MD;  Location: [place] CV LAB;  Service: Cardiovascular;  Laterality: Left;  . LOWER EXTREMITY ANGIOGRAPHY Left 08/02/2019   Procedure: LOWER EXTREMITY ANGIOGRAPHY;  Surgeon: Algernon Huxley, MD;  Location: [place] CV LAB;  Service: Cardiovascular;  Laterality: Left;  . LOWER EXTREMITY ANGIOGRAPHY Left 07/17/2020   Procedure: LOWER EXTREMITY ANGIOGRAPHY;  Surgeon: Algernon Huxley, MD;  Location: [place] CV LAB;  Service: Cardiovascular;  Laterality: Left;  . PERIPHERAL ARTERIAL STENT GRAFT     x2 left/right  . PERIPHERAL VASCULAR BALLOON ANGIOPLASTY Right 01/06/2018   Procedure: PERIPHERAL VASCULAR BALLOON ANGIOPLASTY;  Surgeon: Katha Cabal, MD;  Location: [place] CV LAB;  Service: Cardiovascular;  Laterality: Right;  . PERIPHERAL VASCULAR CATHETERIZATION N/A 07/06/2015   Procedure: Abdominal Aortogram w/Lower Extremity;  Surgeon: Algernon Huxley, MD;  Location: [place] CV LAB;  Service: Cardiovascular;  Laterality: N/A;  . PERIPHERAL VASCULAR CATHETERIZATION  07/06/2015   Procedure: Lower Extremity Intervention;  Surgeon: Algernon Huxley, MD;  Location: [place] CV LAB;  Service: Cardiovascular;;     Current Meds  Medication Sig  . albuterol (VENTOLIN HFA) 108 (90 Base) MCG/ACT inhaler Inhale into the lungs every 6 (six) hours as needed for wheezing or shortness of breath.  [name] atorvastatin (LIPITOR) 80 MG tablet Take 80 mg by mouth daily.  . carvedilol (COREG) 3.125 MG tablet Take 1 tablet (3.125 mg total) by mouth 2 (two) times daily with a meal.  . clopidogrel (PLAVIX) 75 MG tablet Take 75 mg by mouth daily.  . digoxin (LANOXIN) 0.125 MG tablet Take 1 tablet (0.125 mg total) by mouth daily.  . DULoxetine  (CYMBALTA) 30 MG capsule Take 30 mg by mouth daily.   [name] ELIQUIS 5 MG TABS tablet Take 5 mg by mouth 2 (two) times daily.  . empagliflozin (JARDIANCE) 10 MG TABS tablet Take 10 mg by mouth daily.   [name] ezetimibe (ZETIA) 10 MG tablet Take 10 mg by mouth daily.  [name] gabapentin (NEURONTIN) 800 MG tablet Take 800 mg by mouth 4 (four) times daily.   [name] ipratropium-albuterol (DUONEB) 0.5-2.5 (3) MG/3ML SOLN Inhale 3 mLs into the lungs every 6 (six) hours as needed (wheezing/sob).   . isosorbide mononitrate (IMDUR) 30 MG 24 hr tablet Take 30 mg by mouth daily.  [name] losartan (COZAAR) 25 MG tablet Take 0.5 tablets (12.5 mg  total) by mouth daily.  . Multiple Vitamin (MULTI-VITAMINS) TABS Take 1 tablet by mouth daily.   . nitroGLYCERIN (NITROSTAT) 0.4 MG SL tablet Place 1 tablet (0.4 mg total) under the tongue every 5 (five) minutes as needed for chest pain.  [name] omeprazole (PRILOSEC) 40 MG capsule Take 40 mg by mouth daily.  . Oxycodone HCl 10 MG TABS Take 10 mg by mouth every 6 (six) hours as needed (pain).   . polyethylene glycol (MIRALAX / GLYCOLAX) 17 g packet Take 17 g by mouth daily as needed for mild constipation.  . potassium chloride (KLOR-CON) 10 MEQ tablet Take 10 mEq by mouth 2 (two) times daily.  [name] spironolactone (ALDACTONE) 25 MG tablet Take 0.5 tablets (12.5 mg total) by mouth daily.  . SYMBICORT 80-4.5 MCG/ACT inhaler Inhale 2 puffs into the lungs daily.   . tamsulosin (FLOMAX) 0.4 MG CAPS capsule Take 1 capsule (0.4 mg total) by mouth daily after supper.  . torsemide (DEMADEX) 20 MG tablet Take 2 tablets (40 mg total) by mouth daily.  . traZODone (DESYREL) 150 MG tablet Take 150 mg by mouth at bedtime.  . Vericiguat 5 MG TABS Take 5 mg by mouth daily.     Allergies:   Dulaglutide, Ace inhibitors, Other, and Prednisone   Social History   Tobacco Use  . Smoking status: Current Every Day Smoker    Packs/day: 1.00    Years: 41.00    Pack years: 41.00    Types: Cigarettes  . Smokeless  tobacco: Never Used  Vaping Use  . Vaping Use: Never used  Substance Use Topics  . Alcohol use: No  . Drug use: No     Family Hx: The patient's family history includes Heart attack in his father; Heart disease in his father; Hyperlipidemia in his father and mother; Hypertension in his father and mother.  ROS:   Please see the history of present illness.    Review of Systems  Constitutional: Negative.   HENT: Negative.   Respiratory: Positive for shortness of breath.   Cardiovascular: Positive for chest pain.  Gastrointestinal: Negative.   Musculoskeletal: Negative.   Neurological: Negative.   Psychiatric/Behavioral: Negative.   All other systems reviewed and are negative.    Labs/Other Tests and Data Reviewed:    Recent Labs: 08/07/2020: TSH 1.573 08/10/2020: Magnesium 1.9 08/22/2020: ALT 13; Hemoglobin 12.6; Platelets 220 09/28/2020: B Natriuretic Peptide 1,131.5; BUN 23; Creatinine, Ser 1.24; Potassium 4.1; Sodium 133   Recent Lipid Panel Lab Results  Component Value Date/Time   CHOL 118 01/20/2020 03:52 AM   CHOL 211 (H) 04/05/2013 05:20 AM   TRIG 37 01/20/2020 03:52 AM   TRIG 321 (H) 04/05/2013 05:20 AM   HDL 34 (L) 01/20/2020 03:52 AM   HDL 34 (L) 04/05/2013 05:20 AM   CHOLHDL 3.5 01/20/2020 03:52 AM   LDLCALC 77 01/20/2020 03:52 AM   LDLCALC 113 (H) 04/05/2013 05:20 AM    Wt Readings from Last 3 Encounters:  10/09/20 193 lb (87.5 kg)  10/06/20 190 lb (86.2 kg)  10/05/20 193 lb 9.6 oz (87.8 kg)     Exam:    Vital Signs: Vital signs may also be detailed in the HPI BP 98/60 (BP Location: Left Arm, Patient Position: Sitting, Cuff Size: Normal)   Pulse 97   Ht '5\' 7"'$  (1.702 m)   Wt 193 lb (87.5 kg)   SpO2 93%   BMI 30.23 kg/m  Constitutional:  oriented to person, place, and time. No distress.  HENT:  Head: Grossly normal Eyes:  no discharge. No scleral icterus.  Neck: No JVD, no carotid bruits  Cardiovascular: Regular rate and rhythm, no murmurs  appreciated Pulmonary/Chest: Clear to auscultation bilaterally, no wheezes or rails Abdominal: Soft.  no distension.  no tenderness.  Musculoskeletal: Normal range of motion Neurological:  normal muscle tone. Coordination normal. No atrophy Skin: Skin warm and dry Psychiatric: normal affect, pleasant   ASSESSMENT & PLAN:     Chronic systolic HF (heart failure) (HCC) - Compliant with his moderated fluid intake and is torsemide 40 daily with spironolactone Weight down to 190 pounds, 25 pound weight drop Leg swelling much improved Renal function improved No changes made to his medications Blood pressure low Wife giving him his medications  Coronary artery disease of native artery of native heart with stable angina pectoris ([place]) - Previously declined CABG Prior catheterization, left AMA  [facility], also left AMA No further ischemic work-up at this time given poor compliance Chest pain discussed this visit lasting 2 minutes, presenting at rest, relieved with heartburn medication.  Recommended nitro for long episodes of pain  Type 2 diabetes mellitus with foot ulcer, with long-term current use of insulin ([place])  Followed by vascular, has had amputation on the right High risk of further disease given continued smoking Smoking cessation recommended A1c improved  Persistent atrial fibrillation Adequate rate, on anticoagulation Not a candidate to restore normal sinus rhythm  Atherosclerosis of artery of extremity with ulceration (HCC) -  Followed by vascular, prior stent left SFA Amputation on the right Still smoking, cessation recommended Stable  PAD (peripheral artery disease) ([place]) -  smoking cessation discussed Has severe disease  Mixed hyperlipidemia -  Last cholesterol at goal   Total encounter time more than 45 minutes  Greater than 50% was spent in counseling and coordination of care with the patient   Signed, Ida Rogue, MD  10/09/2020 2:54 PM    [place] [id number], [place], [place] [address]

## 2020-10-06 NOTE — ED Provider Notes (Signed)
[facility]    CSN: [id number] Arrival date & time: 10/06/20  1839      History   Chief Complaint Chief Complaint  Patient presents with  . Fall  . Abrasion    Left elbow, left knee    HPI [name] [name] is a 60 y.o. male.   Patient is a pleasant 60 year old male who presents for evaluation of the above issues.  Normally sees Dr. Jodi Mourning at [facility] in [place] for his ongoing medical care.  He was unavailable to see him today.  Patient reports falling in his driveway about 2 hours prior to arrival.  He fell on his left elbow as well as his left knee.  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  Despite this, he reports that he is still smoking.  He also has COPD, significant cardiac history including atherosclerosis, chronic combined systolic and diastolic congestive heart failure with a low EF, coronary artery disease requiring PCI treatment, diabetes, ischemic heart disease, unstable angina, and hyperlipidemia.  He reports falling in his driveway and sustaining abrasions.  His concern is that he was bleeding quite a lot given that he is on 2 anticoagulation medications including Plavix and Eliquis.  Despite his fall he does have good range of motion in both the left knee and left elbow.  Was concerned he may need sutures and he presents today for initial evaluation.  He denies any syncope, it was a mechanical fall.  He also denies chest pain or shortness of breath.  No red flag signs or symptoms elicited on history.     Past Medical History:  Diagnosis Date  . Acute on chronic heart failure ([place])   . AKI (acute kidney injury) ([place]) 01/03/2018  . Angina at rest [facility]) 08/13/2017  . Arthritis   . Asthma   . Atherosclerosis   . BPH (benign prostatic hyperplasia)   . Carotid arterial disease ([place])   . Cellulitis of foot, left 09/27/2019  . Charcot's joint of foot, right   .  Chronic combined systolic (congestive) and diastolic (congestive) heart failure ([place])    a. 02/2013 EF 50% by LV gram; b. 04/2017 Echo: EF 25-30%. diff HK. Gr2 DD. Mod dil LA/RV. PASP 66mHg.  .Marland KitchenChronic foot pain (Secondary Area of Pain) (Right) 01/29/2018  . COPD (chronic obstructive pulmonary disease) ([place]alem   . COPD with acute exacerbation (HFox Chase 01/30/2020  . Coronary artery disease    a. 2013 S/P PCI of LAD ([facility];  b. 03/2013 PCI: RCA 90p ( 2.5 x 23 mm DES); c. 02/2014 Cath: patent RCA stent->Med Rx; d. 04/2017 Cath: LM nl, LAd 30ost/p, 622m, D1 80ost, LCX 90p/m, OM1 85, RCA 70/20p, 802m0d->Referred for CT Surg-felt to be poor candidate.  . Coronary artery disease of native artery of native heart with stable angina pectoris (HCCTheba/08/2017   [facility] Nuclear stress test showed inferior wall defect. Cardiac catheterization showed patent LAD stent with 90% stenosis in the proximal RCA. Ejection fraction was 50%. Status post PCI of the RCA with a 2.5 x 23 mm drug-eluting stent  . Diabetic neuropathy (HCCMount Vista . Diabetic ulcer of right foot (HCCVanleer . Hernia   . HFrEF (heart failure with reduced ejection fraction) (HCCEast Freehold/22/2021  . Hyperlipidemia   . Hypertension   . Ischemic cardiomyopathy  a. 04/2017 Echo: EF 25-30%; b. 08/2017 Cardiac MRI ([facility]): EF 19%, sev glob HK. RVEF 20%, mod BAE, triv MR, mild-mod TR. Basal lateral subendocardial infarct (viable), inf/infsept, dist septal ischemia, basal to mid lat peri-infarct ischemia.  . Morbid obesity ([place])   . Neuropathy   . PAD (peripheral artery disease) ([place])    a. Followed by Dr. Lucky Cowboy; b. 08/2010 Periph Angio: RSFA 70-80p (6X60 self-expanding stent); c. 09/2010 Periph Angio: L SFA 100p (7x unknown length self-expanding stent); d. 06/2015 Periph Angio: R SFA short segment occlusion (6x12 self-expanding stent).  . Restless leg syndrome   . Sepsis ([place]) 03/30/2018  .  Sleep apnea   . Subclavian artery stenosis, right ([place])   . Syncope and collapse   . Tobacco use    a. 75+ yr hx - still smoking 1ppd, down from 2 ppd.  . Type II diabetes mellitus ([place])   . Unstable angina ([place]) 07/11/2016  . Varicose vein     Patient Active Problem List   Diagnosis Date Noted  . Acquired absence of right leg below knee ([place]) 08/22/2020  . Pyogenic inflammation of bone ([place])   . Chronic a-fib ([place])   . Demand ischemia ([place])   . Palliative care by specialist   . CHF (congestive heart failure) ([place]) 06/18/2020  . Chronic anticoagulation 06/17/2020  . Chronic venous insufficiency 02/10/2020  . Persistent atrial fibrillation ([place]) 01/19/2020  . Chronic bilateral low back pain with sciatica 10/08/2019  . Medication management contract signed 10/08/2019  . Chronic hyponatremia 09/27/2019  . CAD (coronary artery disease) 08/31/2019  . Acute on chronic systolic CHF (congestive heart failure) ([place]) 11/05/2018  . Open toe wound 10/01/2018  . Venous ulcer of left leg ([place]) 08/19/2018  . 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[place]) 04/28/2018  . Abnormal MRI, shoulder (Right) 02/23/2018  . Abnormal MRI, cervical spine (2016) 02/23/2018  . DDD (degenerative disc disease), cervical 02/23/2018  . Cervical foraminal stenosis (Bilateral) 02/23/2018  . Cervical central spinal stenosis 02/23/2018  . Cervical facet hypertrophy 02/23/2018  . Arthralgia of acromioclavicular joint (Right) 02/23/2018  . Osteoarthritis of  AC (acromioclavicular) joint (Right) 02/23/2018  . Biceps tendinosis of shoulder (Right) 02/23/2018  . Tendinopathy of rotator cuff (Right) 02/23/2018  . Vitamin D deficiency 02/23/2018  . Atherosclerosis of native arteries of the extremities with ulceration ([place]) 02/15/2018  . Peripheral edema 02/05/2018  . Status post peripherally inserted central catheter (PICC) central line placement 02/04/2018  . Chronic lower extremity pain (Primary Area of Pain)  (Right) 01/29/2018  . Chronic shoulder pain [facility] Area of Pain) (Right) 01/29/2018  . Chronic pain syndrome 01/29/2018  . Long term current use of opiate analgesic 01/29/2018  . Medication monitoring encounter 01/29/2018  . Disorder of skeletal system 01/29/2018  . Problems influencing health status 01/29/2018  . NSTEMI (non-ST elevated myocardial infarction) ([place]) 11/21/2017  . HLD (hyperlipidemia) 09/10/2017  . Angina pectoris ([place]) 09/08/2017  . Lymphedema 05/02/2017  . Chest pain 07/11/2016  . Foot ulcer (Right)   . Stable angina (HCC)   . Pressure injury of skin 05/06/2016  . Diabetic foot infection ([place]) 05/05/2016  . Type 2 diabetes mellitus with hyperlipidemia ([place]) 01/26/2016  . Cellulitis 11/21/2015  . Morbid obesity ([place]) 11/17/2014  . PAD (peripheral artery disease) ([place])   . Hypertension   . Tobacco use   . COPD (chronic obstructive pulmonary disease) ([place]) 05/19/2012  . Diabetic neuropathy ([place]) 05/19/2012  . PVD (peripheral vascular disease) ([place]) 03/27/2012    Past  Surgical History:  Procedure Laterality Date  . ABDOMINAL AORTAGRAM N/A 06/23/2013   Procedure: ABDOMINAL Maxcine Ham;  Surgeon: Wellington Hampshire, MD;  Location: [place] CATH LAB;  Service: Cardiovascular;  Laterality: N/A;  . AMPUTATION Right 04/01/2018   Procedure: AMPUTATION BELOW KNEE;  Surgeon: Algernon Huxley, MD;  Location: [facility] ORS;  Service: Vascular;  Laterality: Right;  . AMPUTATION TOE Left 08/11/2020   Procedure: AMPUTATION TOE-2nd Toe;  Surgeon: Samara Deist, DPM;  Location: [facility] ORS;  Service: Podiatry;  Laterality: Left;  . APPENDECTOMY    . CARDIAC CATHETERIZATION  10/14   [facility]; X1 STENT PROXIMAL RCA  . CARDIAC CATHETERIZATION  02/2010   [facility]  . CARDIAC CATHETERIZATION  [facility]/21/2014   [facility]  . CLAVICLE SURGERY    . HERNIA REPAIR    . IRRIGATION AND DEBRIDEMENT FOOT Right 01/04/2018   Procedure: IRRIGATION AND DEBRIDEMENT FOOT;  Surgeon: Samara Deist, DPM;  Location: [facility] ORS;  Service:  Podiatry;  Laterality: Right;  . LEFT HEART CATH AND CORONARY ANGIOGRAPHY N/A 01/20/2020   Procedure: LEFT HEART CATH AND CORONARY ANGIOGRAPHY;  Surgeon: Wellington Hampshire, MD;  Location: [place] CV LAB;  Service: Cardiovascular;  Laterality: N/A;  . LEFT HEART CATH AND CORS/GRAFTS ANGIOGRAPHY N/A 04/28/2017   Procedure: LEFT HEART CATH AND CORONARY ANGIOGRAPHY;  Surgeon: Wellington Hampshire, MD;  Location: [place] CV LAB;  Service: Cardiovascular;  Laterality: N/A;  . LOWER EXTREMITY ANGIOGRAPHY Right 03/03/2018   Procedure: LOWER EXTREMITY ANGIOGRAPHY;  Surgeon: Katha Cabal, MD;  Location: [place] CV LAB;  Service: Cardiovascular;  Laterality: Right;  . LOWER EXTREMITY ANGIOGRAPHY Left 08/24/2018   Procedure: LOWER EXTREMITY ANGIOGRAPHY;  Surgeon: Algernon Huxley, MD;  Location: [place] CV LAB;  Service: Cardiovascular;  Laterality: Left;  . LOWER EXTREMITY ANGIOGRAPHY Left 06/14/2019   Procedure: LOWER EXTREMITY ANGIOGRAPHY;  Surgeon: Algernon Huxley, MD;  Location: [place] CV LAB;  Service: Cardiovascular;  Laterality: Left;  . LOWER EXTREMITY ANGIOGRAPHY Left 08/02/2019   Procedure: LOWER EXTREMITY ANGIOGRAPHY;  Surgeon: Algernon Huxley, MD;  Location: [place] CV LAB;  Service: Cardiovascular;  Laterality: Left;  . LOWER EXTREMITY ANGIOGRAPHY Left 07/17/2020   Procedure: LOWER EXTREMITY ANGIOGRAPHY;  Surgeon: Algernon Huxley, MD;  Location: [place] CV LAB;  Service: Cardiovascular;  Laterality: Left;  . PERIPHERAL ARTERIAL STENT GRAFT     x2 left/right  . PERIPHERAL VASCULAR BALLOON ANGIOPLASTY Right 01/06/2018   Procedure: PERIPHERAL VASCULAR BALLOON ANGIOPLASTY;  Surgeon: Katha Cabal, MD;  Location: [place] CV LAB;  Service: Cardiovascular;  Laterality: Right;  . PERIPHERAL VASCULAR CATHETERIZATION N/A 07/06/2015   Procedure: Abdominal Aortogram w/Lower Extremity;  Surgeon: Algernon Huxley, MD;  Location: [place] CV LAB;  Service: Cardiovascular;   Laterality: N/A;  . PERIPHERAL VASCULAR CATHETERIZATION  07/06/2015   Procedure: Lower Extremity Intervention;  Surgeon: Algernon Huxley, MD;  Location: [place] CV LAB;  Service: Cardiovascular;;       Home Medications    Prior to Admission medications   Medication Sig Start Date End Date Taking? Authorizing Provider  albuterol (VENTOLIN HFA) 108 (90 Base) MCG/ACT inhaler Inhale into the lungs every 6 (six) hours as needed for wheezing or shortness of breath.   Yes [provider]  atorvastatin (LIPITOR) 80 MG tablet Take 80 mg by mouth daily.   Yes [provider]  carvedilol (COREG) 3.125 MG tablet Take 1 tablet (3.125 mg total) by mouth 2 (two) times daily with a meal. 07/01/20  Yes Shawna Clamp, MD  clopidogrel (PLAVIX) 75 MG tablet Take 75 mg by mouth daily.   Yes [provider]  digoxin (LANOXIN) 0.125 MG tablet Take 1 tablet (0.125 mg total) by mouth daily. 08/12/20  Yes Wieting, Richard, MD  DULoxetine (CYMBALTA) 30 MG capsule Take 30 mg by mouth daily.    Yes [provider]  ELIQUIS 5 MG TABS tablet Take 5 mg by mouth 2 (two) times daily. 02/02/20  Yes [provider]  empagliflozin (JARDIANCE) 10 MG TABS tablet Take 10 mg by mouth daily.  02/03/20  Yes [provider]  ezetimibe (ZETIA) 10 MG tablet Take 10 mg by mouth daily.   Yes [provider]  gabapentin (NEURONTIN) 800 MG tablet Take 800 mg by mouth 4 (four) times daily.  08/14/17 04/04/29 Yes [provider]  ipratropium-albuterol (DUONEB) 0.5-2.5 (3) MG/3ML SOLN Inhale 3 mLs into the lungs every 6 (six) hours as needed (wheezing/sob).    Yes [provider]  isosorbide mononitrate (IMDUR) 30 MG 24 hr tablet Take 30 mg by mouth daily.   Yes [provider]  losartan (COZAAR) 25 MG tablet Take 0.5 tablets (12.5 mg total) by mouth daily. 08/13/20  Yes Wieting, Richard, MD  Multiple Vitamin (MULTI-VITAMINS) TABS Take 1 tablet by mouth daily.     Yes [provider]  nitroGLYCERIN (NITROSTAT) 0.4 MG SL tablet Place 1 tablet (0.4 mg total) under the tongue every 5 (five) minutes as needed for chest pain. 05/19/19  Yes Hackney, Otila Kluver A, FNP  omeprazole (PRILOSEC) 40 MG capsule Take 40 mg by mouth daily.   Yes [provider]  Oxycodone HCl 10 MG TABS Take 10 mg by mouth every 6 (six) hours as needed (pain).  01/15/18  Yes [provider]  polyethylene glycol (MIRALAX / GLYCOLAX) 17 g packet Take 17 g by mouth daily as needed for mild constipation. 08/12/20  Yes Wieting, Richard, MD  potassium chloride (KLOR-CON) 10 MEQ tablet Take 10 mEq by mouth 2 (two) times daily.   Yes [provider]  spironolactone (ALDACTONE) 25 MG tablet Take 0.5 tablets (12.5 mg total) by mouth daily. 07/02/20  Yes Shawna Clamp, MD  SYMBICORT 80-4.5 MCG/ACT inhaler Inhale 2 puffs into the lungs daily.  01/01/20  Yes [provider]  tamsulosin (FLOMAX) 0.4 MG CAPS capsule Take 1 capsule (0.4 mg total) by mouth daily after supper. 07/01/20  Yes Shawna Clamp, MD  torsemide (DEMADEX) 20 MG tablet Take 2 tablets (40 mg total) by mouth daily. 07/01/20  Yes Shawna Clamp, MD  traZODone (DESYREL) 150 MG tablet Take 150 mg by mouth at bedtime. 07/12/19  Yes [provider]  Vericiguat 5 MG TABS Take 5 mg by mouth daily.   Yes [provider]    Family History Family History  Problem Relation Age of Onset  . Heart attack Father   . Heart disease Father   . Hypertension Father   . Hyperlipidemia Father   . Hypertension Mother   . Hyperlipidemia Mother     Social History Social History   Tobacco Use  . Smoking status: Current Every Day Smoker    Packs/day: 1.00    Years: 41.00    Pack years: 41.00    Types: Cigarettes  . Smokeless tobacco: Never Used  Vaping Use  . Vaping Use: Never used  Substance Use Topics  . Alcohol use: No  . Drug use: No     Allergies   Dulaglutide, Ace inhibitors, Other,  and Prednisone   Review of Systems Review of Systems  Constitutional: Negative for chills, diaphoresis, fatigue and fever.  HENT: Negative.  Negative for congestion, ear pain, sinus pressure and sinus pain.   Eyes: Negative.  Negative for pain.  Respiratory: Negative.  Negative for cough, shortness of breath and wheezing.   Cardiovascular: Negative.  Negative for chest pain and palpitations.  Gastrointestinal: Negative.  Negative for abdominal pain, diarrhea, nausea and vomiting.  Genitourinary: Negative.  Negative for dysuria.  Musculoskeletal: Positive for arthralgias. Negative for back pain, joint swelling, myalgias, neck pain and neck stiffness.  Skin: Positive for wound. Negative for color change, pallor and rash.  Neurological: Negative for dizziness, weakness, light-headedness, numbness and headaches.  Hematological: Bruises/bleeds easily.  All other systems reviewed and are negative.    Physical Exam Triage Vital Signs ED Triage Vitals  Enc Vitals Group     BP 10/06/20 1850 (!) 121/93     Pulse Rate 10/06/20 1850 (!) 108     Resp 10/06/20 1850 18     Temp 10/06/20 1850 98.3 F (36.8 C)     Temp Source 10/06/20 1850 Oral     SpO2 10/06/20 1850 96 %     Weight 10/06/20 1848 190 lb (86.2 kg)     Height 10/06/20 1848 '5\' 7"'$  (1.702 m)     Head Circumference --      Peak Flow --      Pain Score 10/06/20 1848 8     Pain Loc --      Pain Edu? --      Excl. in [place]? --    No data found.  Updated Vital Signs BP (!) 121/93 (BP Location: Right Arm)   Pulse (!) 108   Temp 98.3 F (36.8 C) (Oral)   Resp 18   Ht '5\' 7"'$  (1.702 m)   Wt 86.2 kg   SpO2 96%   BMI 29.76 kg/m   Visual Acuity Right Eye Distance:   Left Eye Distance:   Bilateral Distance:    Right Eye Near:   Left Eye Near:    Bilateral Near:     Physical Exam Vitals and nursing note reviewed.  Constitutional:      General: He is not in acute distress.    Appearance: He is not ill-appearing,  toxic-appearing or diaphoretic.  HENT:     Head: Normocephalic and atraumatic.  Cardiovascular:     Rate and Rhythm: Normal rate and regular rhythm.     Pulses: Normal pulses.     Heart sounds: Normal heart sounds. No murmur heard. No friction rub. No gallop.   Pulmonary:     Effort: Pulmonary effort is normal. No respiratory distress.     Breath sounds: Normal breath sounds. No stridor. No wheezing, rhonchi or rales.  Musculoskeletal:     Right elbow: Normal range of motion. No tenderness.     Left elbow: Normal range of motion. No tenderness.     Cervical back: Normal range of motion.     Right Lower Extremity: Right leg is amputated below knee.  Skin:    General: Skin is warm.     Capillary Refill: Capillary refill takes less than 2 seconds.     Findings: Abrasion, signs of injury and wound present.     Comments: Abrasion to left elbow and left knee - mild bleeding  Neurological:     General: No focal deficit present.     Mental Status: He is alert and oriented to  person, place, and time.      UC Treatments / Results  Labs (all labs ordered are listed, but only abnormal results are displayed) Labs Reviewed - No data to display  EKG   Radiology No results found.  Procedures Procedures (including critical care time)  Medications Ordered in UC Medications - No data to display  Initial Impression / Assessment and Plan / UC Course  I have reviewed the triage vital signs and the nursing notes.  Pertinent labs & imaging results that were available during my care of the patient were reviewed by me and considered in my medical decision making (see chart for details).  Clinical impression: 60 year old male with multiple medical issues and significant past medical history for peripheral vascular disease hypertension, diabetes, right below-knee amputation secondary vascular disease, tobacco abuse, COPD, and a history of angina and stents placed for advanced atherosclerosis.   He presents after falling at home shortly prior to arrival and has abrasions with bleeding in his left knee and left elbow.  Of note he is on Eliquis and Plavix.  Treatment plan: 1.  The findings and treatment plan were discussed in detail with the patient.  Patient was in agreement. 2.  I discussed his physical exam findings and the fact that x-rays were not needed. 3.  We cleaned the wounds thoroughly and dressed them. 4.  I want him to keep the wounds clean and dry until he starts to form a scab.  No sutures were needed today. 5.  Counseled him on not smoking and continuing to take his medicines as prescribed. 6.  He did not need a work note. 7.  No medications were provided. 8.  He was discharged in stable condition.  If his symptoms persist he should see his primary care provider.  If they worsen he should go to the ER.  He will follow-up here as needed.  Greater than 30 minutes was spent with the patient, reviewing his chart, obtaining a thorough history, doing a physical exam, coming up with a treatment plan, counseling the patient on his chronic medical conditions, dressing the wounds after thoroughly irrigating and cleaning them, encouraging all questions which were answered to his satisfaction.    Final Clinical Impressions(s) / UC Diagnoses   Final diagnoses:  Fall in home, initial encounter  Abrasion of left knee, initial encounter  Abrasion of left elbow, initial encounter  Tobacco abuse  Tobacco abuse counseling  Peripheral vascular disease [facility])     Discharge Instructions     Please see educational handouts.    ED Prescriptions    None     PDMP not reviewed this encounter.   Verda Cumins, MD 10/09/20 [phone]

## 2020-10-06 NOTE — ED Triage Notes (Signed)
Pt c/o fall in his driveway earlier, has wound to his left elbow and left knee. Pt denies head injury, LOC or other issues. Pt does have balance issues, has prosthetic limb RLE.

## 2020-10-08 ENCOUNTER — Encounter (INDEPENDENT_AMBULATORY_CARE_PROVIDER_SITE_OTHER): Payer: Self-pay | Admitting: Nurse Practitioner

## 2020-10-09 ENCOUNTER — Other Ambulatory Visit: Payer: Self-pay

## 2020-10-09 ENCOUNTER — Ambulatory Visit (INDEPENDENT_AMBULATORY_CARE_PROVIDER_SITE_OTHER): Payer: Medicare Other | Admitting: Cardiovascular Disease

## 2020-10-09 ENCOUNTER — Encounter: Payer: Self-pay | Admitting: Cardiovascular Disease

## 2020-10-09 VITALS — BP 98/60 | HR 97 | Ht 67.0 in | Wt 193.0 lb

## 2020-10-09 DIAGNOSIS — I70299 Other atherosclerosis of native arteries of extremities, unspecified extremity: Secondary | ICD-10-CM

## 2020-10-09 DIAGNOSIS — E1159 Type 2 diabetes mellitus with other circulatory complications: Secondary | ICD-10-CM

## 2020-10-09 DIAGNOSIS — I739 Peripheral vascular disease, unspecified: Secondary | ICD-10-CM

## 2020-10-09 DIAGNOSIS — L97909 Non-pressure chronic ulcer of unspecified part of unspecified lower leg with unspecified severity: Secondary | ICD-10-CM | POA: Diagnosis not present

## 2020-10-09 DIAGNOSIS — I4821 Permanent atrial fibrillation: Secondary | ICD-10-CM

## 2020-10-09 DIAGNOSIS — I5022 Chronic systolic (congestive) heart failure: Secondary | ICD-10-CM | POA: Diagnosis not present

## 2020-10-09 DIAGNOSIS — E782 Mixed hyperlipidemia: Secondary | ICD-10-CM

## 2020-10-09 DIAGNOSIS — E785 Hyperlipidemia, unspecified: Secondary | ICD-10-CM

## 2020-10-09 DIAGNOSIS — E1169 Type 2 diabetes mellitus with other specified complication: Secondary | ICD-10-CM

## 2020-10-09 DIAGNOSIS — Z720 Tobacco use: Secondary | ICD-10-CM

## 2020-10-09 DIAGNOSIS — N179 Acute kidney failure, unspecified: Secondary | ICD-10-CM

## 2020-10-09 DIAGNOSIS — E871 Hypo-osmolality and hyponatremia: Secondary | ICD-10-CM

## 2020-10-09 DIAGNOSIS — I10 Essential (primary) hypertension: Secondary | ICD-10-CM

## 2020-10-09 MED ORDER — EZETIMIBE 10 MG PO TABS: 10.0000 mg | ORAL_TABLET | Freq: Every day | ORAL | 3 refills | Status: AC

## 2020-10-09 MED ORDER — SPIRONOLACTONE 25 MG PO TABS
12.5000 mg | ORAL_TABLET | Freq: Every day | ORAL | 3 refills | Status: AC
Start: 2020-10-09 — End: ?

## 2020-10-09 MED ORDER — POTASSIUM CHLORIDE ER 10 MEQ PO TBCR: 10.0000 meq | EXTENDED_RELEASE_TABLET | Freq: Two times a day (BID) | ORAL | 3 refills | Status: DC

## 2020-10-09 MED ORDER — CARVEDILOL 3.125 MG PO TABS: 3.1250 mg | ORAL_TABLET | Freq: Two times a day (BID) | ORAL | 3 refills | Status: DC

## 2020-10-09 MED ORDER — TORSEMIDE 20 MG PO TABS: 40.0000 mg | ORAL_TABLET | Freq: Every day | ORAL | 3 refills | Status: DC

## 2020-10-09 MED ORDER — CLOPIDOGREL BISULFATE 75 MG PO TABS: 75.0000 mg | ORAL_TABLET | Freq: Every day | ORAL | 3 refills | Status: DC

## 2020-10-09 MED ORDER — DIGOXIN 125 MCG PO TABS: 0.1250 mg | ORAL_TABLET | Freq: Every day | ORAL | 3 refills | Status: DC

## 2020-10-09 MED ORDER — ISOSORBIDE MONONITRATE ER 30 MG PO TB24: 30.0000 mg | ORAL_TABLET | Freq: Every day | ORAL | 3 refills | Status: DC

## 2020-10-09 MED ORDER — NITROGLYCERIN 0.4 MG SL SUBL: 0.4000 mg | SUBLINGUAL_TABLET | SUBLINGUAL | 5 refills | Status: AC | PRN

## 2020-10-09 MED ORDER — ATORVASTATIN CALCIUM 80 MG PO TABS: 80.0000 mg | ORAL_TABLET | Freq: Every day | ORAL | 3 refills | Status: DC

## 2020-10-09 MED ORDER — LOSARTAN POTASSIUM 25 MG PO TABS: 12.5000 mg | ORAL_TABLET | Freq: Every day | ORAL | 3 refills | Status: AC

## 2020-10-09 NOTE — Patient Instructions (Signed)
Medication Instructions:  No changes  If you need a refill on your cardiac medications before your next appointment, please call your pharmacy.    Lab work: No new labs needed   If you have labs (blood work) drawn today and your tests are completely normal, you will receive your results only by: . MyChart Message (if you have MyChart) OR . A paper copy in the mail If you have any lab test that is abnormal or we need to change your treatment, we will call you to review the results.   Testing/Procedures: No new testing needed   Follow-Up: At [facility], you and your health needs are our priority.  As part of our continuing mission to provide you with exceptional heart care, we have created designated Provider Care Teams.  These Care Teams include your primary Cardiologist (physician) and Advanced Practice Providers (APPs -  Physician Assistants and Nurse Practitioners) who all work together to provide you with the care you need, when you need it.  . You will need a follow up appointment in 6 months  . Providers on your designated Care Team:   . Christopher Berge, NP . Ryan Dunn, PA-C . Jacquelyn Visser, PA-C  Any Other Special Instructions Will Be Listed Below (If Applicable).  COVID-19 Vaccine Information can be found at: [email or web address] For questions related to vaccine distribution or appointments, please email [email or web address] or call [phone].     

## 2020-10-12 ENCOUNTER — Ambulatory Visit (INDEPENDENT_AMBULATORY_CARE_PROVIDER_SITE_OTHER): Payer: Medicare Other | Admitting: Nurse Practitioner

## 2020-10-12 ENCOUNTER — Other Ambulatory Visit: Payer: Self-pay

## 2020-10-12 VITALS — BP 110/68 | HR 93 | Ht 67.0 in | Wt 192.0 lb

## 2020-10-12 DIAGNOSIS — I83029 Varicose veins of left lower extremity with ulcer of unspecified site: Secondary | ICD-10-CM

## 2020-10-12 DIAGNOSIS — L97929 Non-pressure chronic ulcer of unspecified part of left lower leg with unspecified severity: Secondary | ICD-10-CM

## 2020-10-12 NOTE — Progress Notes (Signed)
History of Present Illness  There is no documented history at this time  Assessments & Plan   There are no diagnoses linked to this encounter.    Additional instructions  Subjective:  Patient presents with venous ulcer of the Left lower extremity.    Procedure:  3 layer unna wrap was placed Left lower extremity.   Plan:   Follow up in one week.  

## 2020-10-15 ENCOUNTER — Encounter (INDEPENDENT_AMBULATORY_CARE_PROVIDER_SITE_OTHER): Payer: Self-pay | Admitting: Nurse Practitioner

## 2020-10-19 ENCOUNTER — Encounter (INDEPENDENT_AMBULATORY_CARE_PROVIDER_SITE_OTHER): Payer: Medicare Other | Admitting: Nurse Practitioner

## 2020-10-19 ENCOUNTER — Encounter (INDEPENDENT_AMBULATORY_CARE_PROVIDER_SITE_OTHER): Payer: Self-pay

## 2020-10-19 ENCOUNTER — Other Ambulatory Visit: Payer: Self-pay

## 2020-10-19 NOTE — Progress Notes (Deleted)
Pt canceled his unna Wrap he feels he no longer needs the I instructed him to come back for his Unna check.

## 2020-10-26 ENCOUNTER — Encounter (INDEPENDENT_AMBULATORY_CARE_PROVIDER_SITE_OTHER): Payer: Self-pay | Admitting: Nurse Practitioner

## 2020-10-26 ENCOUNTER — Ambulatory Visit (INDEPENDENT_AMBULATORY_CARE_PROVIDER_SITE_OTHER): Payer: Medicare Other | Admitting: Nurse Practitioner

## 2020-11-02 ENCOUNTER — Telehealth (HOSPITAL_COMMUNITY): Payer: Self-pay

## 2020-11-02 NOTE — Telephone Encounter (Signed)
Today had a phone visit with [name] [name].  He states been doing good.  He states has everything for daily living.  Verified he has all his medications.  He stats weight staying good.  He denies chest pain, headaches, dizziness or increased shortness of breath.  He is aware of up coming appts.  He stays active around the home and driving people around.  He has family support.  He sounds in good mood today.  Will continue to visit for heart failure.   [place] [phone]

## 2020-11-16 ENCOUNTER — Other Ambulatory Visit: Payer: Self-pay | Admitting: Family

## 2020-11-19 NOTE — Progress Notes (Signed)
Patient ID: ISSAI KRUS, male    DOB: 30-Jul-1960, 60 y.o.   MRN: [id number]  HPI  Mr Maiolo is a 60 y/o male with a history of obstructive sleep apnea, PVD, PAD, HTN, hyperlipidemia, DM, CAD, COPD, BPH, asthma, current tobacco use and chronic heart failure.   Echo report from 06/17/20 reviewed and showed an EF of <20% along with elevated PA pressure of 53.2 mmHg and mild/ moderate TR. Echo report from 01/20/20 reviewed and showed an EF of <20% along with mild MR and moderate TR. Echo report from 09/29/19 reviewed and showed an EF of 20-25% along with moderately elevated PA pressure, mild MR and mild/moderate TR. Echo report from 11/06/2018 reviewed and showed an EF of 20-25% along with moderate TR and a PA pressure of 59.6 mmHg. Echo report from 04/25/17 reviewed and shows an EF of 25-30% along with a PA pressure of 34 mm Hg. EF has declined from 55-60% back in 2016.   LHC done 01/20/20 showed: Prox RCA-1 lesion is 80% stenosed. Prox RCA-2 lesion is 20% stenosed. Prox RCA to Mid RCA lesion is 80% stenosed. Prox LAD lesion is 20% stenosed. 1st Diag lesion is 80% stenosed. Mid LAD lesion is 20% stenosed. Dist LAD lesion is 60% stenosed. Prox Cx to Mid Cx lesion is 90% stenosed. LPAV lesion is 95% stenosed.   1. Significant underlying three-vessel coronary artery disease. Patent proximal LAD stent with mild in-stent restenosis. Moderate diffuse disease in the distal LAD. Significant stenosis in the proximal left circumflex at the origin of the posterior AV groove artery which is also heavily diseased at the ostium. This is a bifurcation lesion and heavily calcified. RCA stent is patent. However, there is significant proximal disease in the whole mid to distal segment is diffusely diseased and small caliber. 2. Left ventricular angiography was not performed. EF was severe reduced by echo. 3. Moderately elevated left ventricular end-diastolic pressure at 28 mmHg  Cardiac catheterization done 04/28/17  showed significant three-vessel disease with a patient stent in the RCA and LAD. Significant proximal RCS disease and the stent as well as diffuse mid and distal disease. LAD has moderate disease. Severely elevated left ventricular end-diastolic pressure at 34 mmHg. Possible CABG in the future with optimizing medical management. Stress done in 2015.  Was in the ED 08/22/20 due to upper back and shoulder pain. Hypotensive so IVF given along with antibiotics. Symptoms improved and he was released. Admitted 08/05/20 due to Osteomyelitis of the left second toe with diabetic foot infection and cellulitis. Podiatry and cardiology consults obtained. Amputation of the left second toe performed on 08/11/2020. Given IV antibiotics and then transitioned to oral medications. Given IV lasix due to HF exacerbation and then switched to oral diuretics.         Discharged after 7 days. Admitted 06/17/20 due to HF exacerbation. Placed on milrinone and Lasix infusion and then transitioned to oral diuretics. Nephrology, cardiology, palliative care and wound consults obtained. Discharged after 14 days. Was in the ED 03/26/20 due to right shoulder pain where he was evaluated and released.   He presents today for a follow-up visit with a chief complaint of moderate fatigue with little exertion. He describes this as chronic in nature having been present for several years. He has associated shortness of breath, pedal edema and weight gain along with this. He denies any difficulty sleeping, dizziness, chest pain, palpitations or cough.   Says that his left lower leg has gotten red an shiny again.  Says that it improves when his leg is wrapped by vascular but then re-occurs once the wrapping has stopped. He says that he needs to call them to get an appointment.   Past Medical History:  Diagnosis Date   Acute on chronic heart failure ([place])    AKI (acute kidney injury) ([place]) 01/03/2018   Angina at rest [facility]) 08/13/2017   Arthritis    Asthma     Atherosclerosis    BPH (benign prostatic hyperplasia)    Carotid arterial disease ([place])    Cellulitis of foot, left 09/27/2019   Charcot's joint of foot, right    Chronic combined systolic (congestive) and diastolic (congestive) heart failure ([place])    a. 02/2013 EF 50% by LV gram; b. 04/2017 Echo: EF 25-30%. diff HK. Gr2 DD. Mod dil LA/RV. PASP 64mHg.   Chronic foot pain (Secondary Area of Pain) (Right) 01/29/2018   COPD (chronic obstructive pulmonary disease) (HCC)    COPD with acute exacerbation ([place] 01/30/2020   Coronary artery disease    a. 2013 S/P PCI of LAD ([facility];  b. 03/2013 PCI: RCA 90p ( 2.5 x 23 mm DES); c. 02/2014 Cath: patent RCA stent->Med Rx; d. 04/2017 Cath: LM nl, LAd 30ost/p, 630m, D1 80ost, LCX 90p/m, OM1 85, RCA 70/20p, 8078m0d->Referred for CT Surg-felt to be poor candidate.   Coronary artery disease of native artery of native heart with stable angina pectoris ([place]/08/2017   [facility] Nuclear stress test showed inferior wall defect. Cardiac catheterization showed patent LAD stent with 90% stenosis in the proximal RCA. Ejection fraction was 50%. Status post PCI of the RCA with a 2.5 x 23 mm drug-eluting stent   Diabetic neuropathy ([place]  Diabetic ulcer of right foot ([place]CC[place]  [place]    HFrEF (heart failure with reduced ejection fraction) ([place]CC[place]/22/2021   Hyperlipidemia    Hypertension    Ischemic cardiomyopathy    a. 04/2017 Echo: EF 25-30%; b. 08/2017 Cardiac MRI ([facility]): EF 19%, sev glob HK. RVEF 20%, mod BAE, triv MR, mild-mod TR. Basal lateral subendocardial infarct (viable), inf/infsept, dist septal ischemia, basal to mid lat peri-infarct ischemia.   Morbid obesity ([place]CC[place]  Neuropathy    PAD (peripheral artery disease) ([place]  a. Followed by Dr. DewLucky Cowboy. 08/2010 Periph Angio: RSFA 70-80p (6X60 self-expanding stent); c. 09/2010 Periph Angio: L SFA 100p (7x unknown length  self-expanding stent); d. 06/2015 Periph Angio: R SFA short segment occlusion (6x12 self-expanding stent).   Restless leg syndrome    Sepsis (HCCThomaston0/21/2019   Sleep apnea    Subclavian artery stenosis, right (HCC)    Syncope and collapse    Tobacco use    a. 75+ yr hx - still smoking 1ppd, down from 2 ppd.   Type II diabetes mellitus (HCC)    Unstable angina (HCCC[place]/06/2016   Varicose vein    Past Surgical History:  Procedure Laterality Date   ABDOMINAL AORTAGRAM N/A 06/23/2013   Procedure: ABDOMINAL AORMaxcine HamSurgeon: MuhWellington HampshireD;  [facility];  Service: Cardiovascular;  Laterality: N/A;   AMPUTATION Right 04/01/2018   Procedure: AMPUTATION BELOW KNEE;  Surgeon: DewAlgernon HuxleyD;  Location: [facility] ORS;  Service: Vascular;  Laterality: Right;   AMPUTATION TOE Left 08/11/2020   Procedure: AMPUTATION TOE-2nd Toe;  Surgeon: FowSamara DeistPM;  Location: [facility] ORS;  Service:  Podiatry;  Laterality: Left;   APPENDECTOMY     CARDIAC CATHETERIZATION  10/14   [place]; X1 STENT PROXIMAL RCA   CARDIAC CATHETERIZATION  02/2010   [facility]   CARDIAC [facility]IZATION  [facility]/21/2014   [facility]   CLAVICLE SURGERY     HERNIA REPAIR     IRRIGATION AND DEBRIDEMENT FOOT Right 01/04/2018   Procedure: IRRIGATION AND DEBRIDEMENT FOOT;  Surgeon: Samara Deist, DPM;  Location: [facility] ORS;  Service: Podiatry;  Laterality: Right;   LEFT HEART CATH AND CORONARY ANGIOGRAPHY N/A 01/20/2020   Procedure: LEFT HEART CATH AND CORONARY ANGIOGRAPHY;  Surgeon: Wellington Hampshire, MD;  Location: [place] CV LAB;  Service: Cardiovascular;  Laterality: N/A;   LEFT HEART CATH AND CORS/GRAFTS ANGIOGRAPHY N/A 04/28/2017   Procedure: LEFT HEART CATH AND CORONARY ANGIOGRAPHY;  Surgeon: Wellington Hampshire, MD;  Location: [place] CV LAB;  Service: Cardiovascular;  Laterality: N/A;   LOWER EXTREMITY ANGIOGRAPHY Right 03/03/2018   Procedure: LOWER EXTREMITY ANGIOGRAPHY;  Surgeon: Katha Cabal, MD;  Location: [place] CV LAB;  Service: Cardiovascular;  Laterality: Right;   LOWER EXTREMITY ANGIOGRAPHY Left 08/24/2018   Procedure: LOWER EXTREMITY ANGIOGRAPHY;  Surgeon: Algernon Huxley, MD;  Location: [place] CV LAB;  Service: Cardiovascular;  Laterality: Left;   LOWER EXTREMITY ANGIOGRAPHY Left 06/14/2019   Procedure: LOWER EXTREMITY ANGIOGRAPHY;  Surgeon: Algernon Huxley, MD;  Location: [place] CV LAB;  Service: Cardiovascular;  Laterality: Left;   LOWER EXTREMITY ANGIOGRAPHY Left 08/02/2019   Procedure: LOWER EXTREMITY ANGIOGRAPHY;  Surgeon: Algernon Huxley, MD;  Location: [place] CV LAB;  Service: Cardiovascular;  Laterality: Left;   LOWER EXTREMITY ANGIOGRAPHY Left 07/17/2020   Procedure: LOWER EXTREMITY ANGIOGRAPHY;  Surgeon: Algernon Huxley, MD;  Location: [place] CV LAB;  Service: Cardiovascular;  Laterality: Left;   PERIPHERAL ARTERIAL STENT GRAFT     x2 left/right   PERIPHERAL VASCULAR BALLOON ANGIOPLASTY Right 01/06/2018   Procedure: PERIPHERAL VASCULAR BALLOON ANGIOPLASTY;  Surgeon: Katha Cabal, MD;  Location: [place] CV LAB;  Service: Cardiovascular;  Laterality: Right;   PERIPHERAL VASCULAR CATHETERIZATION N/A 07/06/2015   Procedure: Abdominal Aortogram w/Lower Extremity;  Surgeon: Algernon Huxley, MD;  Location: [place] CV LAB;  Service: Cardiovascular;  Laterality: N/A;   PERIPHERAL VASCULAR CATHETERIZATION  07/06/2015   Procedure: Lower Extremity Intervention;  Surgeon: Algernon Huxley, MD;  Location: [place] CV LAB;  Service: Cardiovascular;;   Family History  Problem Relation Age of Onset   Heart attack Father    Heart disease Father    Hypertension Father    Hyperlipidemia Father    Hypertension Mother    Hyperlipidemia Mother    Social History   Tobacco Use   Smoking status: Every Day    Packs/day: 1.00    Years: 41.00    Pack years: 41.00    Types: Cigarettes   Smokeless tobacco: Never  Substance Use Topics   Alcohol use: No   Allergies   Allergen Reactions   Dulaglutide Anaphylaxis, Diarrhea and Hives   Ace Inhibitors     hypotension   Other Itching    Skin itching associated with nitro patch   Prednisone Rash   Prior to Admission medications   Medication Sig Start Date End Date Taking? Authorizing Provider  albuterol (VENTOLIN HFA) 108 (90 Base) MCG/ACT inhaler Inhale into the lungs every 6 (six) hours as needed for wheezing or shortness of breath.   Yes [provider]  atorvastatin (LIPITOR) 80  MG tablet Take 1 tablet (80 mg total) by mouth daily. 10/09/20  Yes Minna Merritts, MD  carvedilol (COREG) 3.125 MG tablet Take 1 tablet (3.125 mg total) by mouth 2 (two) times daily with a meal. 10/09/20  Yes Gollan, Kathlene November, MD  clopidogrel (PLAVIX) 75 MG tablet Take 1 tablet (75 mg total) by mouth daily. 10/09/20  Yes Minna Merritts, MD  digoxin (LANOXIN) 0.125 MG tablet Take 1 tablet (0.125 mg total) by mouth daily. 10/09/20  Yes Gollan, Kathlene November, MD  DULoxetine (CYMBALTA) 30 MG capsule Take 30 mg by mouth daily.    Yes [provider]  ELIQUIS 5 MG TABS tablet Take 5 mg by mouth 2 (two) times daily. 02/02/20  Yes [provider]  empagliflozin (JARDIANCE) 10 MG TABS tablet Take 10 mg by mouth daily.  02/03/20  Yes [provider]  ezetimibe (ZETIA) 10 MG tablet Take 1 tablet (10 mg total) by mouth daily. 10/09/20  Yes Minna Merritts, MD  gabapentin (NEURONTIN) 800 MG tablet Take 800 mg by mouth 4 (four) times daily.  08/14/17 04/04/29 Yes [provider]  ipratropium-albuterol (DUONEB) 0.5-2.5 (3) MG/3ML SOLN Inhale 3 mLs into the lungs every 6 (six) hours as needed (wheezing/sob).    Yes [provider]  isosorbide mononitrate (IMDUR) 30 MG 24 hr tablet Take 1 tablet (30 mg total) by mouth daily. 10/09/20  Yes Minna Merritts, MD  losartan (COZAAR) 25 MG tablet Take 0.5 tablets (12.5 mg total) by mouth daily. 10/09/20  Yes Minna Merritts, MD  Multiple Vitamin  (MULTI-VITAMINS) TABS Take 1 tablet by mouth daily.    Yes [provider]  omeprazole (PRILOSEC) 40 MG capsule Take 40 mg by mouth daily.   Yes [provider]  potassium chloride (KLOR-CON) 10 MEQ tablet Take 1 tablet (10 mEq total) by mouth 2 (two) times daily. 10/09/20  Yes Minna Merritts, MD  spironolactone (ALDACTONE) 25 MG tablet Take 0.5 tablets (12.5 mg total) by mouth daily. 10/09/20  Yes Minna Merritts, MD  SYMBICORT 80-4.5 MCG/ACT inhaler Inhale 1 puff into the lungs 2 (two) times daily. 01/01/20  Yes [provider]  tamsulosin (FLOMAX) 0.4 MG CAPS capsule Take 1 capsule (0.4 mg total) by mouth daily after supper. 07/01/20  Yes Shawna Clamp, MD  torsemide (DEMADEX) 20 MG tablet Take 2 tablets (40 mg total) by mouth daily. Patient taking differently: Take 40 mg by mouth 2 (two) times daily. 10/09/20  Yes Minna Merritts, MD  traZODone (DESYREL) 150 MG tablet Take 150 mg by mouth at bedtime. 07/12/19  Yes [provider]  VERQUVO 10 MG TABS TAKE 1 TABLET BY MOUTH ONCE DAILY 11/16/20  Yes Darylene Price A, FNP  nitroGLYCERIN (NITROSTAT) 0.4 MG SL tablet Place 1 tablet (0.4 mg total) under the tongue every 5 (five) minutes as needed for chest pain. Patient not taking: Reported on 11/20/2020 10/09/20   Minna Merritts, MD  Oxycodone HCl 10 MG TABS Take 10 mg by mouth every 6 (six) hours as needed (pain).  01/15/18   [provider]  polyethylene glycol (MIRALAX / GLYCOLAX) 17 g packet Take 17 g by mouth daily as needed for mild constipation. Patient not taking: Reported on 11/20/2020 08/12/20   Loletha Grayer, MD  Vericiguat 5 MG TABS Take 5 mg by mouth daily. Patient not taking: Reported on 11/20/2020    [provider]     Review of Systems  Constitutional:  Negative for  appetite change and fever.  HENT:  Negative for congestion, rhinorrhea, sore throat and trouble swallowing.   Eyes: Negative.   Respiratory:  Positive for shortness of  breath. Negative for cough.   Cardiovascular:  Positive for leg swelling. Negative for chest pain and palpitations.  Gastrointestinal:  Negative for abdominal distention and abdominal pain.  Endocrine: Negative.   Genitourinary: Negative.   Musculoskeletal:  Positive for arthralgias (right shoulder).  Skin:  Positive for color change (left lower leg shiny red).  Allergic/Immunologic: Negative.   Neurological:  Negative for dizziness and light-headedness.  Hematological:  Negative for adenopathy. Does not bruise/bleed easily.  Psychiatric/Behavioral:  Negative for dysphoric mood and suicidal ideas. The patient is not nervous/anxious.    Vitals:   11/20/20 1115  BP: 130/62  Pulse: 70  Resp: 18  SpO2: 97%  Weight: 197 lb 8 oz (89.6 kg)  Height: '5\' 7"'$  (1.702 m)   Wt Readings from Last 3 Encounters:  11/20/20 197 lb 8 oz (89.6 kg)  10/12/20 192 lb (87.1 kg)  10/09/20 193 lb (87.5 kg)   Lab Results  Component Value Date   CREATININE 1.24 09/28/2020   CREATININE 1.50 (H) 08/22/2020   CREATININE 1.23 08/12/2020   Physical Exam Vitals and nursing note reviewed.  Constitutional:      Appearance: Normal appearance.  HENT:     Head: Normocephalic and atraumatic.  Cardiovascular:     Rate and Rhythm: Normal rate and regular rhythm.  Pulmonary:     Effort: Pulmonary effort is normal. No respiratory distress.     Breath sounds: No wheezing or rales.  Abdominal:     General: There is no distension.     Palpations: Abdomen is soft.     Tenderness: There is no abdominal tenderness.  Musculoskeletal:     Cervical back: Normal range of motion and neck supple.     Left lower leg: Edema (1+ edema in left lower leg) present.     Comments: Right lower leg prosthesis  Skin:    General: Skin is warm.     Findings: Erythema (left lower leg) present.  Neurological:     General: No focal deficit present.     Mental Status: He is alert and oriented to person, place, and time.  Psychiatric:         Mood and Affect: Mood normal.        Behavior: Behavior normal.        Thought Content: Thought content normal.    Assessment & Plan:  1: Acute on Chronic heart failure with reduced ejection fraction- - NYHA class III - fluid overloaded today - weighing daily; reminded to call for an overnight weight gain of >2 pounds or a weekly weight gain of >5 pounds - weight up 8 pounds from last visit 3 months ago - discussed using IV lasix versus oral metolazone; will use metolazone 2.'5mg'$  daily for the next 3 days; he was instructed to take it 1/2 hour prior to torsemide - during these 3 days, he's to double his potassium dosage to 2 tablets BID; after 3 days of metolazone use, he's to resume his potassium as 1 tablet BID - check BMP at next visit - saw cardiology Rockey Situ) 10/09/20 - saw pulmonology Raul Del) 10/18/20 - palliative care visit done 08/03/20 - BNP 09/28/20 was 1131.5 - participating in paramedicine program  - PharmD reconciled medications with the patient  2: HTN- - BP looks good today - saw PCP Jodi Mourning) 10/02/20 - BMP done 09/28/20  reviewed and shows sodium 133, potassium 4.1, creatinine 1.24 and GFR >60  3: Tobacco use- - currently smoking ~ 3 cigarettes daily - complete cessation discussed for 3 minutes with him  4: Severe atherosclerosis of lower extremities- - had left lower leg angiogram 07/17/20 - saw vascular Owens Shark) 07/05/20 - 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 Each medication was verbally reviewed with the patient and he was encouraged to bring the bottles to every visit to confirm accuracy of list.   Return in 2 days or sooner for any questions/problems before then.

## 2020-11-20 ENCOUNTER — Other Ambulatory Visit: Payer: Self-pay

## 2020-11-20 ENCOUNTER — Ambulatory Visit: Payer: Medicare Other | Attending: Family | Admitting: Family

## 2020-11-20 ENCOUNTER — Encounter: Payer: Self-pay | Admitting: Family

## 2020-11-20 ENCOUNTER — Encounter: Payer: Self-pay | Admitting: Pharmacist

## 2020-11-20 VITALS — BP 130/62 | HR 70 | Resp 18 | Ht 67.0 in | Wt 197.5 lb

## 2020-11-20 DIAGNOSIS — Z79899 Other long term (current) drug therapy: Secondary | ICD-10-CM | POA: Insufficient documentation

## 2020-11-20 DIAGNOSIS — Z7902 Long term (current) use of antithrombotics/antiplatelets: Secondary | ICD-10-CM | POA: Diagnosis not present

## 2020-11-20 DIAGNOSIS — I2510 Atherosclerotic heart disease of native coronary artery without angina pectoris: Secondary | ICD-10-CM | POA: Insufficient documentation

## 2020-11-20 DIAGNOSIS — Z955 Presence of coronary angioplasty implant and graft: Secondary | ICD-10-CM | POA: Diagnosis not present

## 2020-11-20 DIAGNOSIS — N400 Enlarged prostate without lower urinary tract symptoms: Secondary | ICD-10-CM | POA: Insufficient documentation

## 2020-11-20 DIAGNOSIS — E1159 Type 2 diabetes mellitus with other circulatory complications: Secondary | ICD-10-CM

## 2020-11-20 DIAGNOSIS — F17210 Nicotine dependence, cigarettes, uncomplicated: Secondary | ICD-10-CM | POA: Diagnosis not present

## 2020-11-20 DIAGNOSIS — E785 Hyperlipidemia, unspecified: Secondary | ICD-10-CM | POA: Insufficient documentation

## 2020-11-20 DIAGNOSIS — Z7984 Long term (current) use of oral hypoglycemic drugs: Secondary | ICD-10-CM | POA: Diagnosis not present

## 2020-11-20 DIAGNOSIS — I5043 Acute on chronic combined systolic (congestive) and diastolic (congestive) heart failure: Secondary | ICD-10-CM

## 2020-11-20 DIAGNOSIS — Z8249 Family history of ischemic heart disease and other diseases of the circulatory system: Secondary | ICD-10-CM | POA: Insufficient documentation

## 2020-11-20 DIAGNOSIS — I70209 Unspecified atherosclerosis of native arteries of extremities, unspecified extremity: Secondary | ICD-10-CM | POA: Diagnosis not present

## 2020-11-20 DIAGNOSIS — I110 Hypertensive heart disease with heart failure: Secondary | ICD-10-CM | POA: Diagnosis present

## 2020-11-20 DIAGNOSIS — Z888 Allergy status to other drugs, medicaments and biological substances status: Secondary | ICD-10-CM | POA: Insufficient documentation

## 2020-11-20 DIAGNOSIS — E1151 Type 2 diabetes mellitus with diabetic peripheral angiopathy without gangrene: Secondary | ICD-10-CM | POA: Insufficient documentation

## 2020-11-20 DIAGNOSIS — Z720 Tobacco use: Secondary | ICD-10-CM

## 2020-11-20 DIAGNOSIS — Z89511 Acquired absence of right leg below knee: Secondary | ICD-10-CM | POA: Diagnosis not present

## 2020-11-20 DIAGNOSIS — J449 Chronic obstructive pulmonary disease, unspecified: Secondary | ICD-10-CM | POA: Diagnosis not present

## 2020-11-20 DIAGNOSIS — I5020 Unspecified systolic (congestive) heart failure: Secondary | ICD-10-CM | POA: Diagnosis not present

## 2020-11-20 DIAGNOSIS — I10 Essential (primary) hypertension: Secondary | ICD-10-CM

## 2020-11-20 DIAGNOSIS — I70219 Atherosclerosis of native arteries of extremities with intermittent claudication, unspecified extremity: Secondary | ICD-10-CM

## 2020-11-20 MED ORDER — METOLAZONE 2.5 MG PO TABS: 2.5000 mg | ORAL_TABLET | Freq: Every day | ORAL | 0 refills | Status: DC

## 2020-11-20 NOTE — Progress Notes (Signed)
[place] - PHARMACIST COUNSELING NOTE  Guideline-Directed Medical Therapy/Evidence Based Medicine  ACE/ARB/ARNI: Losartan 12.5 mg daily  Beta Blocker: Carvedilol 3.125 mg twice daily Aldosterone Antagonist: Spironolactone 12.5 mg daily Diuretic: Torsemide 40 mg twice daily SGLT2i: Empagliflozin 10 mg daily  Adherence Assessment  Do you ever forget to take your medication? '[]'$ Yes '[x]'$ No  Do you ever skip doses due to side effects? '[]'$ Yes '[x]'$ No  Do you have trouble affording your medicines? '[]'$ Yes '[x]'$ No  Are you ever unable to pick up your medication due to transportation difficulties? '[]'$ Yes '[x]'$ No  Do you ever stop taking your medications because you don't believe they are helping? '[]'$ Yes '[x]'$ No  Do you check your weight daily? '[x]'$ Yes '[]'$ No   Adherence strategy: Pt's girlfriend fills his pillbox. Pt states he is working with [place] for getting his medications in pillpack/bubblepack  Barriers to obtaining medications: N/A  Vital signs: HR 70, BP 130/62, weight (pounds) 197 ECHO: Date 06/17/2020, EF <20%, notes LV global hypokinesis, LV severely dilated, moderate RVE, LA/RA severely dilated  BMP Latest Ref Rng & Units 09/28/2020 08/22/2020 08/12/2020  Glucose 70 - 99 mg/dL 137(H) 131(H) 129(H)  BUN 6 - 20 mg/dL 23(H) 20 29(H)  Creatinine 0.61 - 1.24 mg/dL 1.24 1.50(H) 1.23  BUN/Creat Ratio 9 - 20 - - -  Sodium 135 - 145 mmol/L 133(L) 133(L) 134(L)  Potassium 3.5 - 5.1 mmol/L 4.1 3.4(L) 4.8  Chloride 98 - 111 mmol/L 96(L) 93(L) 99  CO2 22 - 32 mmol/L '30 31 28  '$ Calcium 8.9 - 10.3 mg/dL 8.9 9.1 9.2    Past Medical History:  Diagnosis Date   Acute on chronic heart failure (HCC)    AKI (acute kidney injury) ([place]) 01/03/2018   Angina at rest [facility]) 08/13/2017   Arthritis    Asthma    Atherosclerosis    BPH (benign prostatic hyperplasia)    Carotid arterial disease ([place])    Cellulitis of foot, left 09/27/2019   Charcot's joint of  foot, right    Chronic combined systolic (congestive) and diastolic (congestive) heart failure ([place])    a. 02/2013 EF 50% by LV gram; b. 04/2017 Echo: EF 25-30%. diff HK. Gr2 DD. Mod dil LA/RV. PASP 55mHg.   Chronic foot pain (Secondary Area of Pain) (Right) 01/29/2018   COPD (chronic obstructive pulmonary disease) (HCC)    COPD with acute exacerbation [place] 01/30/2020   Coronary artery disease    a. 2013 S/P PCI [facility] LAD ([facility];  b. 03/2013 PCI: RCA 90p ( 2.5 x 23 mm DES); c. 02/2014 Cath: patent RCA stent->Med Rx; d. 04/2017 Cath: LM nl, LAd 30ost/p, 69m, D1 80ost, LCX 90p/m, OM1 85, RCA 70/20p, 8028m0d->Referred for CT Surg-felt to be poor candidate.   Coronary artery disease of native artery of native heart with stable angina pectoris ([place]/08/2017   [facility] Nuclear stress test showed inferior wall defect. Cardiac catheterization showed patent LAD stent with 90% stenosis in the proximal RCA. Ejection fraction was 50%. Status post PCI of the RCA with a 2.5 x 23 mm drug-eluting stent   Diabetic neuropathy ([place]  Diabetic ulcer of right foot ([facility]  [place]    HFrEF (heart failure with reduced ejection fraction) ([place]/22/2021   Hyperlipidemia    Hypertension    Ischemic cardiomyopathy    a. 04/2017 Echo: EF 25-30%; b. 08/2017  Cardiac MRI ([facility]): EF 19%, sev glob HK. RVEF 20%, mod BAE, triv MR, mild-mod TR. Basal lateral subendocardial infarct (viable), inf/infsept, dist septal ischemia, basal to mid lat peri-infarct ischemia.   Morbid obesity ([place])    Neuropathy    PAD (peripheral artery disease) ([place])    a. Followed by Dr. Lucky Cowboy; b. 08/2010 Periph Angio: RSFA 70-80p (6X60 self-expanding stent); c. 09/2010 Periph Angio: L SFA 100p (7x unknown length self-expanding stent); d. 06/2015 Periph Angio: R SFA short segment occlusion (6x12 self-expanding stent).   Restless leg syndrome    Sepsis ([place]) 03/30/2018    Sleep apnea    Subclavian artery stenosis, right (HCC)    Syncope and collapse    Tobacco use    a. 75+ yr hx - still smoking 1ppd, down from 2 ppd.   Type II diabetes mellitus (HCC)    Unstable angina ([place]) 07/11/2016   Varicose vein     ASSESSMENT 60 year old male who presents to the HF clinic for a follow up visit. Pt states he sometimes feels lightheaded/dizzy when he does some work around the house or the yard and that the heat makes it worse. Pt could not indicate which medications he was taking; he said to ask his girlfriend. I called her and she was able to review his medications with me. She did state he was taking his torsemide twice daily because the pt says he needed it. Pt was previously on Entresto however this was stopped due to renal function and low BP.   Recent ED Visit (past 6 months): Date - 10/06/2020, [facility] - fall  PLAN CHF/HTN -continue carvedilol 3.125 mg twice daily, empagliflozin 10 mg daily, losartan 12.5 mg daily, spironolactone 12.5 mg daily, torsemide 40 mg twice daily, and vericiguat 10 mg daily  -continue daily weight checks -consider titration of GDMT at a later visit if BP allows  -per discussion with provider, pt has edema/wt gain - start metolazone 2.5 mg daily x 3 days; pt to follow up 11/22/2020  Afib -continue digoxin 0.125 mg daily, apixaban 5 mg twice daily, and carvedilol as above   HLD -continue atorvastatin 80 mg daily, ezetimibe 10 mg daily   Angina -continue isosorbide mononitrate 30 mg daily   COPD -continue albuterol and Duonebs as needed -continue Symbicort 1 puff twice daily  -pt girlfriend stated he was started on Trelegy but when he took it he felt the Symbicort worked better so he switched back to Symbicort  Peripheral neuropathy  -continue gabapentin 800 mg four times daily    Time spent: 10 minutes  Sherilyn Banker, PharmD Pharmacy Resident  11/20/2020 11:47 AM     Current Outpatient Medications:    albuterol (VENTOLIN  HFA) 108 (90 Base) MCG/ACT inhaler, Inhale into the lungs every 6 (six) hours as needed for wheezing or shortness of breath., Disp: , Rfl:    atorvastatin (LIPITOR) 80 MG tablet, Take 1 tablet (80 mg total) by mouth daily., Disp: 90 tablet, Rfl: 3   carvedilol (COREG) 3.125 MG tablet, Take 1 tablet (3.125 mg total) by mouth 2 (two) times daily with a meal., Disp: 90 tablet, Rfl: 3   clopidogrel (PLAVIX) 75 MG tablet, Take 1 tablet (75 mg total) by mouth daily., Disp: 90 tablet, Rfl: 3   digoxin (LANOXIN) 0.125 MG tablet, Take 1 tablet (0.125 mg total) by mouth daily., Disp: 90 tablet, Rfl: 3   DULoxetine (CYMBALTA) 30 MG capsule, Take 30 mg by mouth daily. , Disp: , Rfl:  ELIQUIS 5 MG TABS tablet, Take 5 mg by mouth 2 (two) times daily., Disp: , Rfl:    empagliflozin (JARDIANCE) 10 MG TABS tablet, Take 10 mg by mouth daily. , Disp: , Rfl:    ezetimibe (ZETIA) 10 MG tablet, Take 1 tablet (10 mg total) by mouth daily., Disp: 90 tablet, Rfl: 3   gabapentin (NEURONTIN) 800 MG tablet, Take 800 mg by mouth 4 (four) times daily. , Disp: , Rfl:    ipratropium-albuterol (DUONEB) 0.5-2.5 (3) MG/3ML SOLN, Inhale 3 mLs into the lungs every 6 (six) hours as needed (wheezing/sob). , Disp: , Rfl:    isosorbide mononitrate (IMDUR) 30 MG 24 hr tablet, Take 1 tablet (30 mg total) by mouth daily., Disp: 90 tablet, Rfl: 3   losartan (COZAAR) 25 MG tablet, Take 0.5 tablets (12.5 mg total) by mouth daily., Disp: 46 tablet, Rfl: 3   metolazone (ZAROXOLYN) 2.5 MG tablet, Take 1 tablet (2.5 mg total) by mouth daily. Take 1/2 hour before morning torsemide, Disp: 3 tablet, Rfl: 0   Multiple Vitamin (MULTI-VITAMINS) TABS, Take 1 tablet by mouth daily. , Disp: , Rfl:    nitroGLYCERIN (NITROSTAT) 0.4 MG SL tablet, Place 1 tablet (0.4 mg total) under the tongue every 5 (five) minutes as needed for chest pain. (Patient not taking: Reported on 11/20/2020), Disp: 30 tablet, Rfl: 5   omeprazole (PRILOSEC) 40 MG capsule, Take 40 mg by  mouth daily., Disp: , Rfl:    Oxycodone HCl 10 MG TABS, Take 10 mg by mouth every 6 (six) hours as needed (pain). , Disp: , Rfl:    polyethylene glycol (MIRALAX / GLYCOLAX) 17 g packet, Take 17 g by mouth daily as needed for mild constipation. (Patient not taking: Reported on 11/20/2020), Disp: 14 each, Rfl: 0   potassium chloride (KLOR-CON) 10 MEQ tablet, Take 1 tablet (10 mEq total) by mouth 2 (two) times daily., Disp: 180 tablet, Rfl: 3   spironolactone (ALDACTONE) 25 MG tablet, Take 0.5 tablets (12.5 mg total) by mouth daily., Disp: 90 tablet, Rfl: 3   SYMBICORT 80-4.5 MCG/ACT inhaler, Inhale 1 puff into the lungs 2 (two) times daily., Disp: , Rfl:    tamsulosin (FLOMAX) 0.4 MG CAPS capsule, Take 1 capsule (0.4 mg total) by mouth daily after supper., Disp: 30 capsule, Rfl: 1   torsemide (DEMADEX) 20 MG tablet, Take 2 tablets (40 mg total) by mouth daily. (Patient taking differently: Take 40 mg by mouth 2 (two) times daily.), Disp: 180 tablet, Rfl: 3   traZODone (DESYREL) 150 MG tablet, Take 150 mg by mouth at bedtime., Disp: , Rfl:    Vericiguat 5 MG TABS, Take 5 mg by mouth daily. (Patient not taking: Reported on 11/20/2020), Disp: , Rfl:    VERQUVO 10 MG TABS, TAKE 1 TABLET BY MOUTH ONCE DAILY, Disp: 30 tablet, Rfl: 5   DRUGS TO CAUTION IN HEART FAILURE  Drug or Class Mechanism  Analgesics NSAIDs COX-2 inhibitors Glucocorticoids  Sodium and water retention, increased systemic vascular resistance, decreased response to diuretics   Diabetes Medications Metformin Thiazolidinediones Rosiglitazone (Avandia) Pioglitazone (Actos) DPP4 Inhibitors Saxagliptin (Onglyza) Sitagliptin (Januvia)   Lactic acidosis Possible calcium channel blockade   Unknown  Antiarrhythmics Class I  Flecainide Disopyramide Class III Sotalol Other Dronedarone  Negative inotrope, proarrhythmic   Proarrhythmic, beta blockade  Negative inotrope  Antihypertensives Alpha Blockers Doxazosin Calcium  Channel Blockers Diltiazem Verapamil Nifedipine Central Alpha Adrenergics Moxonidine Peripheral Vasodilators Minoxidil  Increases renin and aldosterone  Negative inotrope    Possible  sympathetic withdrawal  Unknown  Anti-infective Itraconazole Amphotericin B  Negative inotrope Unknown  Hematologic Anagrelide Cilostazol   Possible inhibition of PD IV Inhibition of PD III causing arrhythmias  Neurologic/Psychiatric Stimulants Anti-Seizure Drugs Carbamazepine Pregabalin Antidepressants Tricyclics Citalopram Parkinsons Bromocriptine Pergolide Pramipexole Antipsychotics Clozapine Antimigraine Ergotamine Methysergide Appetite suppressants Bipolar Lithium  Peripheral alpha and beta agonist activity  Negative inotrope and chronotrope Calcium channel blockade  Negative inotrope, proarrhythmic Dose-dependent QT prolongation  Excessive serotonin activity/valvular damage Excessive serotonin activity/valvular damage Unknown  IgE mediated hypersensitivy, calcium channel blockade  Excessive serotonin activity/valvular damage Excessive serotonin activity/valvular damage Valvular damage  Direct myofibrillar degeneration, adrenergic stimulation  Antimalarials Chloroquine Hydroxychloroquine Intracellular inhibition of lysosomal enzymes  Urologic Agents Alpha Blockers Doxazosin Prazosin Tamsulosin Terazosin  Increased renin and aldosterone  Adapted from Page Carleene Overlie, et al. "Drugs That May Cause or Exacerbate Heart Failure: A Scientific Statement from the American Heart  Association." Circulation 2016; 134:e32-e69. DOI: 10.1161/CIR.0000000000000426   MEDICATION ADHERENCES TIPS AND STRATEGIES Taking medication as prescribed improves patient outcomes in heart failure (reduces hospitalizations, improves symptoms, increases survival) Side effects of medications can be managed by decreasing doses, switching agents, stopping drugs, or adding additional therapy.  Please let someone in the [facility] know if you have having bothersome side effects so we can modify your regimen. Do not alter your medication regimen without talking to [place].  Medication reminders can help patients remember to take drugs on time. If you are missing or forgetting doses you can try linking behaviors, using pill boxes, or an electronic reminder like an alarm on your phone or an app. Some people can also get automated phone calls as medication reminders.

## 2020-11-20 NOTE — Patient Instructions (Addendum)
Continue weighing daily and call for an overnight weight gain of > 2 pounds or a weekly weight gain of >5 pounds.     Take metolazone (booster fluid pill) 1/2 hour before torsemide dose. You will take the booster fluid pill one time each day in the morning.    While taking the booster fluid pill, you will need to double your amount of potassium to 2 tablets twice a day. Once you finish the booster fluid pill, you will go back to taking 1 tablet twice a day

## 2020-11-22 ENCOUNTER — Ambulatory Visit: Payer: Medicare Other | Admitting: Family

## 2020-11-22 ENCOUNTER — Telehealth: Payer: Self-pay | Admitting: Family

## 2020-11-22 NOTE — Telephone Encounter (Signed)
Patient did not show for his Heart Failure Clinic appointment on 11/22/20. Will attempt to reschedule.

## 2020-11-22 NOTE — Progress Notes (Deleted)
Patient ID: [name] [name], male    DOB: 11-04-1960, 60 y.o.   MRN: [id number]  HPI  [name] [name] is a 61 y/o male with a history of obstructive sleep apnea, PVD, PAD, HTN, hyperlipidemia, DM, CAD, COPD, BPH, asthma, current tobacco use and chronic heart failure.   Echo report from 06/17/20 reviewed and showed an EF of <20% along with elevated PA pressure of 53.2 mmHg and mild/ moderate TR. Echo report from 01/20/20 reviewed and showed an EF of <20% along with mild [name] and moderate TR. Echo report from 09/29/19 reviewed and showed an EF of 20-25% along with moderately elevated PA pressure, mild [name] and mild/moderate TR. Echo report from 11/06/2018 reviewed and showed an EF of 20-25% along with moderate TR and a PA pressure of 59.6 mmHg. Echo report from 04/25/17 reviewed and shows an EF of 25-30% along with a PA pressure of 34 mm Hg. EF has declined from 55-60% back in 2016.   LHC done 01/20/20 showed: Prox RCA-1 lesion is 80% stenosed. Prox RCA-2 lesion is 20% stenosed. Prox RCA to Mid RCA lesion is 80% stenosed. Prox LAD lesion is 20% stenosed. 1st Diag lesion is 80% stenosed. Mid LAD lesion is 20% stenosed. Dist LAD lesion is 60% stenosed. Prox Cx to Mid Cx lesion is 90% stenosed. LPAV lesion is 95% stenosed.   1. Significant underlying three-vessel coronary artery disease. Patent proximal LAD stent with mild in-stent restenosis. Moderate diffuse disease in the distal LAD. Significant stenosis in the proximal left circumflex at the origin of the posterior AV groove artery which is also heavily diseased at the ostium. This is a bifurcation lesion and heavily calcified. RCA stent is patent. However, there is significant proximal disease in the whole mid to distal segment is diffusely diseased and small caliber. 2. Left ventricular angiography was not performed. EF was severe reduced by echo. 3. Moderately elevated left ventricular end-diastolic pressure at 28 mmHg  Cardiac catheterization done 04/28/17  showed significant three-vessel disease with a patient stent in the RCA and LAD. Significant proximal RCS disease and the stent as well as diffuse mid and distal disease. LAD has moderate disease. Severely elevated left ventricular end-diastolic pressure at 34 mmHg. Possible CABG in the future with optimizing medical management. Stress done in 2015.  Was in the ED 08/22/20 due to upper back and shoulder pain. Hypotensive so IVF given along with antibiotics. Symptoms improved and [name] [name] was released. Admitted 08/05/20 due to Osteomyelitis of the left second toe with diabetic foot infection and cellulitis. Podiatry and cardiology consults obtained. Amputation of the left second toe performed on 08/11/2020. Given IV antibiotics and then transitioned to oral medications. Given IV lasix due to HF exacerbation and then switched to oral diuretics.   Discharged after 7 days. Admitted 06/17/20 due to HF exacerbation. Placed on milrinone and Lasix infusion and then transitioned to oral diuretics. Nephrology, cardiology, palliative care and wound consults obtained. Discharged after 14 days. Was in the ED 03/26/20 due to right shoulder pain where [name] [name] was evaluated and released.   [name] [name] presents today for a follow-up visit with a chief complaint of   Has taken metolazone 2.'5mg'$  daily with extra potassium for the last 3 days.   Past Medical History:  Diagnosis Date   Acute on chronic heart failure ([place])    AKI (acute kidney injury) ([place]) 01/03/2018   Angina at rest [facility]) 08/13/2017   Arthritis    Asthma    Atherosclerosis    BPH (benign  prostatic hyperplasia)    Carotid arterial disease (HCC)    Cellulitis of foot, left 09/27/2019   Charcot's joint of foot, right    Chronic combined systolic (congestive) and diastolic (congestive) heart failure ([place])    a. 02/2013 EF 50% by LV gram; b. 04/2017 Echo: EF 25-30%. diff HK. Gr2 DD. Mod dil LA/RV. PASP 37mHg.   Chronic foot pain (Secondary Area of Pain) (Right) 01/29/2018   COPD  (chronic obstructive pulmonary disease) (HCC)    COPD with acute exacerbation ([place] 01/30/2020   Coronary artery disease    a. 2013 S/P PCI of LAD ([facility];  b. 03/2013 PCI: RCA 90p ( 2.5 x 23 mm DES); c. 02/2014 Cath: patent RCA stent->Med Rx; d. 04/2017 Cath: LM nl, LAd 30ost/p, 648m, D1 80ost, LCX 90p/m, OM1 85, RCA 70/20p, 8064m0d->Referred for CT Surg-felt to be poor candidate.   Coronary artery disease of native artery of native heart with stable angina pectoris (HCC[place]ousat[place]/08/2017   [facility] Nuclear stress test showed inferior wall defect. Cardiac catheterization showed patent LAD stent with 90% stenosis in the proximal RCA. Ejection fraction was 50%. Status post PCI of the RCA with a 2.5 x 23 mm drug-eluting stent   Diabetic neuropathy ([place]CC[place]  Diabetic ulcer of right foot (HCCGaston  Hernia    HFrEF (heart failure with reduced ejection fraction) (HCCB[place]/22/2021   Hyperlipidemia    Hypertension    Ischemic cardiomyopathy    a. 04/2017 Echo: EF 25-30%; b. 08/2017 Cardiac MRI ([facility]): EF 19%, sev glob HK. RVEF 20%, mod BAE, triv [name], mild-mod TR. Basal lateral subendocardial infarct (viable), inf/infsept, dist septal ischemia, basal to mid lat peri-infarct ischemia.   Morbid obesity ([place]CCCl[place]  Neuropathy    PAD (peripheral artery disease) ([place]CCGreens[place]  a. Followed by Dr. DewLucky Cowboy. 08/2010 Periph Angio: RSFA 70-80p (6X60 self-expanding stent); c. 09/2010 Periph Angio: L SFA 100p (7x unknown length self-expanding stent); d. 06/2015 Periph Angio: R SFA short segment occlusion (6x12 self-expanding stent).   Restless leg syndrome    Sepsis (HCCBlanchard0/21/2019   Sleep apnea    Subclavian artery stenosis, right (HCC)    Syncope and collapse    Tobacco use    a. 75+ yr hx - still smoking 1ppd, down from 2 ppd.   Type II diabetes mellitus (HCC)    Unstable angina (HCCWest Liberty/06/2016   Varicose vein    Past Surgical History:  Procedure  Laterality Date   ABDOMINAL AORTAGRAM N/A 06/23/2013   Procedure: ABDOMINAL AORMaxcine HamSurgeon: MuhWellington HampshireD;  [facility];  Service: Cardiovascular;  Laterality: N/A;   AMPUTATION Right 04/01/2018   Procedure: AMPUTATION BELOW KNEE;  Surgeon: DewAlgernon HuxleyD;  Location: [facility] ORS;  Service: Vascular;  Laterality: Right;   AMPUTATION TOE Left 08/11/2020   Procedure: AMPUTATION TOE-2nd Toe;  Surgeon: FowSamara DeistPM;  Location: [facility] ORS;  Service: Podiatry;  Laterality: Left;   APPENDECTOMY     CARDIAC CATHETERIZATION  10/14   [facility] ST[facility] PROXIMAL [facility]   [facility]   CLAVICLE SURGERY     HERNIA REPAIR     IRRIGATION AND DEBRIDEMENT FOOT Right 01/04/2018   Procedure: IRRIGATION AND DEBRIDEMENT FOOT;  Surgeon: FowSamara DeistPM;  Location: [facility] ORS;  Service: Podiatry;  Laterality: Right;   LEFT HEART CATH AND CORONARY ANGIOGRAPHY N/A 01/20/2020   Procedure: LEFT HEART CATH AND CORONARY ANGIOGRAPHY;  Surgeon: Wellington Hampshire, MD;  Location: [place] CV LAB;  Service: Cardiovascular;  Laterality: N/A;   LEFT HEART CATH AND CORS/GRAFTS ANGIOGRAPHY N/A 04/28/2017   Procedure: LEFT HEART CATH AND CORONARY ANGIOGRAPHY;  Surgeon: Wellington Hampshire, MD;  Location: [place] CV LAB;  Service: Cardiovascular;  Laterality: N/A;   LOWER EXTREMITY ANGIOGRAPHY Right 03/03/2018   Procedure: LOWER EXTREMITY ANGIOGRAPHY;  Surgeon: Katha Cabal, MD;  Location: [place] CV LAB;  Service: Cardiovascular;  Laterality: Right;   LOWER EXTREMITY ANGIOGRAPHY Left 08/24/2018   Procedure: LOWER EXTREMITY ANGIOGRAPHY;  Surgeon: Algernon Huxley, MD;  Location: [place] CV LAB;  Service: Cardiovascular;  Laterality: Left;   LOWER EXTREMITY ANGIOGRAPHY Left 06/14/2019   Procedure: LOWER EXTREMITY ANGIOGRAPHY;  Surgeon: Algernon Huxley, MD;  Location: [place] CV LAB;  Service: Cardiovascular;  Laterality:  Left;   LOWER EXTREMITY ANGIOGRAPHY Left 08/02/2019   Procedure: LOWER EXTREMITY ANGIOGRAPHY;  Surgeon: Algernon Huxley, MD;  Location: [place] CV LAB;  Service: Cardiovascular;  Laterality: Left;   LOWER EXTREMITY ANGIOGRAPHY Left 07/17/2020   Procedure: LOWER EXTREMITY ANGIOGRAPHY;  Surgeon: Algernon Huxley, MD;  Location: [place] CV LAB;  Service: Cardiovascular;  Laterality: Left;   PERIPHERAL ARTERIAL STENT GRAFT     x2 left/right   PERIPHERAL VASCULAR BALLOON ANGIOPLASTY Right 01/06/2018   Procedure: PERIPHERAL VASCULAR BALLOON ANGIOPLASTY;  Surgeon: Katha Cabal, MD;  Location: [place] CV LAB;  Service: Cardiovascular;  Laterality: Right;   PERIPHERAL VASCULAR CATHETERIZATION N/A 07/06/2015   Procedure: Abdominal Aortogram w/Lower Extremity;  Surgeon: Algernon Huxley, MD;  Location: [place] CV LAB;  Service: Cardiovascular;  Laterality: N/A;   PERIPHERAL VASCULAR CATHETERIZATION  07/06/2015   Procedure: Lower Extremity Intervention;  Surgeon: Algernon Huxley, MD;  Location: [place] CV LAB;  Service: Cardiovascular;;   Family History  Problem Relation Age of Onset   Heart attack Father    Heart disease Father    Hypertension Father    Hyperlipidemia Father    Hypertension Mother    Hyperlipidemia Mother    Social History   Tobacco Use   Smoking status: Every Day    Packs/day: 1.00    Years: 41.00    Pack years: 41.00    Types: Cigarettes   Smokeless tobacco: Never  Substance Use Topics   Alcohol use: No   Allergies  Allergen Reactions   Dulaglutide Anaphylaxis, Diarrhea and Hives   Ace Inhibitors     hypotension   Other Itching    Skin itching associated with nitro patch   Prednisone Rash      Review of Systems  Constitutional:  Negative for appetite change and fever.  HENT:  Negative for congestion, rhinorrhea, sore throat and trouble swallowing.   Eyes: Negative.   Respiratory:  Positive for shortness of breath. Negative for cough.    Cardiovascular:  Positive for leg swelling. Negative for chest pain and palpitations.  Gastrointestinal:  Negative for abdominal distention and abdominal pain.  Endocrine: Negative.   Genitourinary: Negative.   Musculoskeletal:  Positive for arthralgias (right shoulder).  Skin:  Positive for color change (left lower leg shiny red).  Allergic/Immunologic: Negative.   Neurological:  Negative for dizziness and light-headedness.  Hematological:  Negative for adenopathy. Does not bruise/bleed easily.  Psychiatric/Behavioral:  Negative for dysphoric mood and suicidal ideas. The patient is  not nervous/anxious.     Physical Exam Vitals and nursing note reviewed.  Constitutional:      Appearance: Normal appearance.  HENT:     Head: Normocephalic and atraumatic.  Cardiovascular:     Rate and Rhythm: Normal rate and regular rhythm.  Pulmonary:     Effort: Pulmonary effort is normal. No respiratory distress.     Breath sounds: No wheezing or rales.  Abdominal:     General: There is no distension.     Palpations: Abdomen is soft.     Tenderness: There is no abdominal tenderness.  Musculoskeletal:     Cervical back: Normal range of motion and neck supple.     Left lower leg: Edema (1+ edema in left lower leg) present.     Comments: Right lower leg prosthesis  Skin:    General: Skin is warm.     Findings: Erythema (left lower leg) present.  Neurological:     General: No focal deficit present.     Mental Status: [name] [name] is alert and oriented to person, place, and time.  Psychiatric:        Mood and Affect: Mood normal.        Behavior: Behavior normal.        Thought Content: Thought content normal.    Assessment & Plan:  1: Acute on Chronic heart failure with reduced ejection fraction- - NYHA class III - fluid overloaded today - weighing daily; reminded to call for an overnight weight gain of >2 pounds or a weekly weight gain of >5 pounds - weight 197.8 pounds from last visit 2 days  ago - took metolazone 2.'5mg'$  daily for the last 3 days with additional potassium - check BMP today - saw cardiology Rockey Situ) 10/09/20 - saw pulmonology Raul Del) 10/18/20 - palliative care visit done 08/03/20 - BNP 09/28/20 was 1131.5 - participating in paramedicine program  - PharmD reconciled medications with the patient  2: HTN- - BP  - saw PCP Jodi Mourning) 10/02/20 - BMP done 09/28/20 reviewed and shows sodium 133, potassium 4.1, creatinine 1.24 and GFR >60  3: Tobacco use- - currently smoking ~ 3 cigarettes daily - complete cessation discussed for 3 minutes with him  4: Severe atherosclerosis of lower extremities- - had left lower leg angiogram 07/17/20 - saw vascular Owens Shark) 07/05/20 - 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 Each medication was verbally reviewed with the patient and [name] [name] was encouraged to bring the bottles to every visit to confirm accuracy of list.

## 2020-11-23 ENCOUNTER — Other Ambulatory Visit: Payer: Self-pay

## 2020-11-23 ENCOUNTER — Ambulatory Visit (INDEPENDENT_AMBULATORY_CARE_PROVIDER_SITE_OTHER): Payer: Medicare Other | Admitting: Nurse Practitioner

## 2020-11-23 ENCOUNTER — Telehealth (INDEPENDENT_AMBULATORY_CARE_PROVIDER_SITE_OTHER): Payer: Self-pay | Admitting: Vascular Surgery

## 2020-11-23 VITALS — BP 118/66 | HR 68 | Ht 68.0 in | Wt 195.0 lb

## 2020-11-23 DIAGNOSIS — I83029 Varicose veins of left lower extremity with ulcer of unspecified site: Secondary | ICD-10-CM | POA: Diagnosis not present

## 2020-11-23 DIAGNOSIS — L97929 Non-pressure chronic ulcer of unspecified part of left lower leg with unspecified severity: Secondary | ICD-10-CM

## 2020-11-23 NOTE — Progress Notes (Signed)
History of Present Illness  There is no documented history at this time  Assessments & Plan   There are no diagnoses linked to this encounter.    Additional instructions  Subjective:  Patient presents with venous ulcer of the Left lower extremity.    Procedure:  3 layer unna wrap was placed Left lower extremity.   Plan:   Follow up in one week.  

## 2020-11-23 NOTE — Telephone Encounter (Signed)
Patient called that left leg is swollen, leaking blood/puss, and he also has blisters. Patient would like to come in to be wrapped. Patient was last seen 10/12/20 for unna boot. Added patient to schedule this afternoon to be wrapped.  This note is for documentation purposes only

## 2020-11-27 ENCOUNTER — Encounter (INDEPENDENT_AMBULATORY_CARE_PROVIDER_SITE_OTHER): Payer: Self-pay | Admitting: Nurse Practitioner

## 2020-11-30 ENCOUNTER — Other Ambulatory Visit: Payer: Self-pay

## 2020-11-30 ENCOUNTER — Ambulatory Visit (INDEPENDENT_AMBULATORY_CARE_PROVIDER_SITE_OTHER): Payer: Medicare Other | Admitting: Nurse Practitioner

## 2020-11-30 ENCOUNTER — Encounter (INDEPENDENT_AMBULATORY_CARE_PROVIDER_SITE_OTHER): Payer: Self-pay

## 2020-11-30 VITALS — BP 108/70 | HR 103 | Resp 16 | Wt 197.2 lb

## 2020-11-30 DIAGNOSIS — L97929 Non-pressure chronic ulcer of unspecified part of left lower leg with unspecified severity: Secondary | ICD-10-CM | POA: Diagnosis not present

## 2020-11-30 DIAGNOSIS — I83029 Varicose veins of left lower extremity with ulcer of unspecified site: Secondary | ICD-10-CM | POA: Diagnosis not present

## 2020-11-30 NOTE — Progress Notes (Signed)
History of Present Illness  There is no documented history at this time  Assessments & Plan   There are no diagnoses linked to this encounter.    Additional instructions  Subjective:  Patient presents with venous ulcer of the Left lower extremity.    Procedure:  3 layer unna wrap was placed Left lower extremity.   Plan:   Follow up in one week.  

## 2020-12-04 ENCOUNTER — Encounter (INDEPENDENT_AMBULATORY_CARE_PROVIDER_SITE_OTHER): Payer: Self-pay | Admitting: Nurse Practitioner

## 2020-12-06 ENCOUNTER — Telehealth (HOSPITAL_COMMUNITY): Payer: Self-pay

## 2020-12-06 NOTE — Telephone Encounter (Signed)
Attempted to contact, no answer and left a message.   [place] [phone]

## 2020-12-07 ENCOUNTER — Encounter (INDEPENDENT_AMBULATORY_CARE_PROVIDER_SITE_OTHER): Payer: Medicare Other

## 2020-12-08 ENCOUNTER — Ambulatory Visit (INDEPENDENT_AMBULATORY_CARE_PROVIDER_SITE_OTHER): Payer: Medicare Other | Admitting: Nurse Practitioner

## 2020-12-08 ENCOUNTER — Other Ambulatory Visit: Payer: Self-pay

## 2020-12-08 VITALS — BP 100/66 | HR 80 | Ht 67.0 in | Wt 202.0 lb

## 2020-12-08 DIAGNOSIS — I83029 Varicose veins of left lower extremity with ulcer of unspecified site: Secondary | ICD-10-CM | POA: Diagnosis not present

## 2020-12-08 DIAGNOSIS — L97929 Non-pressure chronic ulcer of unspecified part of left lower leg with unspecified severity: Secondary | ICD-10-CM | POA: Diagnosis not present

## 2020-12-08 NOTE — Progress Notes (Signed)
History of Present Illness  There is no documented history at this time  Assessments & Plan   There are no diagnoses linked to this encounter.    Additional instructions  Subjective:  Patient presents with venous ulcer of the Left lower extremity.    Procedure:  3 layer unna wrap was placed Left lower extremity.   Plan:   Follow up in one week.  

## 2020-12-12 ENCOUNTER — Telehealth: Payer: Self-pay | Admitting: Family

## 2020-12-12 MED ORDER — METOLAZONE 2.5 MG PO TABS
2.5000 mg | ORAL_TABLET | Freq: Every day | ORAL | 0 refills | Status: DC
Start: 2020-12-12 — End: 2021-01-18

## 2020-12-12 NOTE — Telephone Encounter (Signed)
Patient called complaining of nausea and requests phenergan. Advised that will need to come from his PCP.   He also says that he's having increased swelling in his left leg. Currently taking spironolactone 12.'5mg'$  daily, torsemide '40mg'$  BID and potassium 9mq BID. Also feels more tired than usual.   Will send in metolazone 2.'5mg'$  daily for 3 days. Reminded to take it 1/2 hour before torsemide and to take an additional potassium tablet on the 3 days that he takes metolazone. Patient to come to the [facility] in 2 days for evaluation and lab work.   Patient verbalized understanding.

## 2020-12-13 NOTE — Progress Notes (Signed)
Patient ID: [name] [name], male    DOB: 1960-10-26, 60 y.o.   MRN: [id number]  HPI  [name] [name] is a 60 y/o male with a history of obstructive sleep apnea, PVD, PAD, HTN, hyperlipidemia, DM, CAD, COPD, BPH, asthma, current tobacco use and chronic heart failure.   Echo report from 06/17/20 reviewed and showed an EF of <20% along with elevated PA pressure of 53.2 mmHg and mild/ moderate TR. Echo report from 01/20/20 reviewed and showed an EF of <20% along with mild [name] and moderate TR. Echo report from 09/29/19 reviewed and showed an EF of 20-25% along with moderately elevated PA pressure, mild [name] and mild/moderate TR. Echo report from 11/06/2018 reviewed and showed an EF of 20-25% along with moderate TR and a PA pressure of 59.6 mmHg. Echo report from 04/25/17 reviewed and shows an EF of 25-30% along with a PA pressure of 34 mm Hg. EF has declined from 55-60% back in 2016.   LHC done 01/20/20 showed: Prox RCA-1 lesion is 80% stenosed. Prox RCA-2 lesion is 20% stenosed. Prox RCA to Mid RCA lesion is 80% stenosed. Prox LAD lesion is 20% stenosed. 1st Diag lesion is 80% stenosed. Mid LAD lesion is 20% stenosed. Dist LAD lesion is 60% stenosed. Prox Cx to Mid Cx lesion is 90% stenosed. LPAV lesion is 95% stenosed.   1. Significant underlying three-vessel coronary artery disease. Patent proximal LAD stent with mild in-stent restenosis. Moderate diffuse disease in the distal LAD. Significant stenosis in the proximal left circumflex at the origin of the posterior AV groove artery which is also heavily diseased at the ostium. This is a bifurcation lesion and heavily calcified. RCA stent is patent. However, there is significant proximal disease in the whole mid to distal segment is diffusely diseased and small caliber. 2. Left ventricular angiography was not performed. EF was severe reduced by echo. 3. Moderately elevated left ventricular end-diastolic pressure at 28 mmHg  Cardiac catheterization done 04/28/17  showed significant three-vessel disease with a patient stent in the RCA and LAD. Significant proximal RCS disease and the stent as well as diffuse mid and distal disease. LAD has moderate disease. Severely elevated left ventricular end-diastolic pressure at 34 mmHg. Possible CABG in the future with optimizing medical management. Stress done in 2015.  Was in the ED 08/22/20 due to upper back and shoulder pain. Hypotensive so IVF given along with antibiotics. Symptoms improved and he was released. Admitted 08/05/20 due to Osteomyelitis of the left second toe with diabetic foot infection and cellulitis. Podiatry and cardiology consults obtained. Amputation of the left second toe performed on 08/11/2020. Given IV antibiotics and then transitioned to oral medications. Given IV lasix due to HF exacerbation and then switched to oral diuretics.   Discharged after 7 days. Admitted 06/17/20 due to HF exacerbation. Placed on milrinone and Lasix infusion and then transitioned to oral diuretics. Nephrology, cardiology, palliative care and wound consults obtained. Discharged after 14 days.   He presents today for a follow-up visit with a chief complaint of moderate fatigue with little exertion. He describes this as chronic in nature having been present for several years. He has associated shortness of breath (improving), intermittent chest pain, pedal edema, abdominal distention (improving), light- headedness and difficulty sleeping along with this. He denies any palpitations or cough.   Says that his weight has been declining. He picked up the metolazone from 2 days ago but hasn't started taking it yet. [name] [name] he was feeling a little bit better  although today he doesn't feel very good.   Past Medical History:  Diagnosis Date   Acute on chronic heart failure ([place])    AKI (acute kidney injury) ([place]) 01/03/2018   Angina at rest [facility]) 08/13/2017   Arthritis    Asthma    Atherosclerosis    BPH (benign prostatic hyperplasia)     Carotid arterial disease ([place])    Cellulitis of foot, left 09/27/2019   Charcot's joint of foot, right    Chronic combined systolic (congestive) and diastolic (congestive) heart failure ([place])    a. 02/2013 EF 50% by LV gram; b. 04/2017 Echo: EF 25-30%. diff HK. Gr2 DD. Mod dil LA/RV. PASP 15mHg.   Chronic foot pain (Secondary Area of Pain) (Right) 01/29/2018   COPD (chronic obstructive pulmonary disease) (HCC)    COPD with acute exacerbation ([place] 01/30/2020   Coronary artery disease    a. 2013 S/P PCI of LAD ([facility];  b. 03/2013 PCI: RCA 90p ( 2.5 x 23 mm DES); c. 02/2014 Cath: patent RCA stent->Med Rx; d. 04/2017 Cath: LM nl, LAd 30ost/p, 628m, D1 80ost, LCX 90p/m, OM1 85, RCA 70/20p, 8059m0d->Referred for CT Surg-felt to be poor candidate.   Coronary artery disease of native artery of native heart with stable angina pectoris (HCCMingo/08/2017   [facility] Nuclear stress test showed inferior wall defect. Cardiac catheterization showed patent LAD stent with 90% stenosis in the proximal RCA. Ejection fraction was 50%. Status post PCI of the RCA with a 2.5 x 23 mm drug-eluting stent   Diabetic neuropathy (HCC[place]  Diabetic ulcer of right foot ([place]CC[place]oulevard [place]  [place]    HFrEF (heart failure with reduced ejection fraction) ([place]CC[place]/22/2021   Hyperlipidemia    Hypertension    Ischemic cardiomyopathy    a. 04/2017 Echo: EF 25-30%; b. 08/2017 Cardiac MRI ([facility]): EF 19%, sev glob HK. RVEF 20%, mod BAE, triv [name], mild-mod TR. Basal lateral subendocardial infarct (viable), inf/infsept, dist septal ischemia, basal to mid lat peri-infarct ischemia.   Morbid obesity (HCCStaff[place]  Neuropathy    PAD (peripheral artery disease) (HCCSan Ysid[place]  a. Followed by Dr. DewLucky Cowboy. 08/2010 Periph Angio: RSFA 70-80p (6X60 self-expanding stent); c. 09/2010 Periph Angio: L SFA 100p (7x unknown length self-expanding stent); d. 06/2015 Periph Angio: R SFA short segment  occlusion (6x12 self-expanding stent).   Restless leg syndrome    Sepsis (HCCTrinway0/21/2019   Sleep apnea    Subclavian artery stenosis, right (HCC)    Syncope and collapse    Tobacco use    a. 75+ yr hx - still smoking 1ppd, down from 2 ppd.   Type II diabetes mellitus (HCC)    Unstable angina (HCCEdna/06/2016   Varicose vein    Past Surgical History:  Procedure Laterality Date   ABDOMINAL AORTAGRAM N/A 06/23/2013   Procedure: ABDOMINAL AORMaxcine HamSurgeon: MuhWellington HampshireD;  [facility];  Service: Cardiovascular;  Laterality: N/A;   AMPUTATION Right 04/01/2018   Procedure: AMPUTATION BELOW KNEE;  Surgeon: DewAlgernon HuxleyD;  Location: [facility] ORS;  Service: Vascular;  Laterality: Right;   AMPUTATION TOE Left 08/11/2020   Procedure: AMPUTATION TOE-2nd Toe;  Surgeon: FowSamara DeistPM;  Location: [facility] ORS;  Service: Podiatry;  Laterality: Left;   APPENDECTOMY     CARDIAC CATHETERIZATION  10/14   [facility]; X1 STENT PROXIMAL RCA  CARDIAC CATHETERIZATION  02/2010   [facility]   CARDIAC [facility]IZATION  [facility]/21/2014   [facility]   CLAVICLE SURGERY     HERNIA REPAIR     IRRIGATION AND DEBRIDEMENT FOOT Right 01/04/2018   Procedure: IRRIGATION AND DEBRIDEMENT FOOT;  Surgeon: Samara Deist, DPM;  Location: [facility] ORS;  Service: Podiatry;  Laterality: Right;   LEFT HEART CATH AND CORONARY ANGIOGRAPHY N/A 01/20/2020   Procedure: LEFT HEART CATH AND CORONARY ANGIOGRAPHY;  Surgeon: Wellington Hampshire, MD;  Location: [place] CV LAB;  Service: Cardiovascular;  Laterality: N/A;   LEFT HEART CATH AND CORS/GRAFTS ANGIOGRAPHY N/A 04/28/2017   Procedure: LEFT HEART CATH AND CORONARY ANGIOGRAPHY;  Surgeon: Wellington Hampshire, MD;  Location: [place] CV LAB;  Service: Cardiovascular;  Laterality: N/A;   LOWER EXTREMITY ANGIOGRAPHY Right 03/03/2018   Procedure: LOWER EXTREMITY ANGIOGRAPHY;  Surgeon: Katha Cabal, MD;  Location: [place] [facility] LAB;  Service: Cardiovascular;  Laterality: Right;   LOWER  EXTREMITY ANGIOGRAPHY Left 08/24/2018   Procedure: LOWER EXTREMITY ANGIOGRAPHY;  Surgeon: Algernon Huxley, MD;  Location: [place] CV LAB;  Service: Cardiovascular;  Laterality: Left;   LOWER EXTREMITY ANGIOGRAPHY Left 06/14/2019   Procedure: LOWER EXTREMITY ANGIOGRAPHY;  Surgeon: Algernon Huxley, MD;  Location: [place] CV LAB;  Service: Cardiovascular;  Laterality: Left;   LOWER EXTREMITY ANGIOGRAPHY Left 08/02/2019   Procedure: LOWER EXTREMITY ANGIOGRAPHY;  Surgeon: Algernon Huxley, MD;  Location: [place] CV LAB;  Service: Cardiovascular;  Laterality: Left;   LOWER EXTREMITY ANGIOGRAPHY Left 07/17/2020   Procedure: LOWER EXTREMITY ANGIOGRAPHY;  Surgeon: Algernon Huxley, MD;  Location: [place] [facility] LAB;  Service: Cardiovascular;  Laterality: Left;   PERIPHERAL ARTERIAL STENT GRAFT     x2 left/right   PERIPHERAL VASCULAR BALLOON ANGIOPLASTY Right 01/06/2018   Procedure: PERIPHERAL VASCULAR BALLOON ANGIOPLASTY;  Surgeon: Katha Cabal, MD;  Location: [place] [facility] LAB;  Service: Cardiovascular;  Laterality: Right;   PERIPHERAL VASCULAR CATHETERIZATION N/A 07/06/2015   Procedure: Abdominal Aortogram w/Lower Extremity;  Surgeon: Algernon Huxley, MD;  Location: [place] CV LAB;  Service: Cardiovascular;  Laterality: N/A;   PERIPHERAL VASCULAR CATHETERIZATION  07/06/2015   Procedure: Lower Extremity Intervention;  Surgeon: Algernon Huxley, MD;  Location: [place] CV LAB;  Service: Cardiovascular;;   Family History  Problem Relation Age of Onset   Heart attack Father    Heart disease Father    Hypertension Father    Hyperlipidemia Father    Hypertension Mother    Hyperlipidemia Mother    Social History   Tobacco Use   Smoking status: Every Day    Packs/day: 1.00    Years: 41.00    Pack years: 41.00    Types: Cigarettes   Smokeless tobacco: Never  Substance Use Topics   Alcohol use: No   Allergies  Allergen Reactions   Dulaglutide Anaphylaxis, Diarrhea and Hives   Ace  Inhibitors     hypotension   Other Itching    Skin itching associated with nitro patch   Prednisone Rash   Prior to Admission medications   Medication Sig Start Date End Date Taking? Authorizing Provider  albuterol (VENTOLIN HFA) 108 (90 Base) MCG/ACT inhaler Inhale into the lungs every 6 (six) hours as needed for wheezing or shortness of breath.   Yes [provider]  atorvastatin (LIPITOR) 80 MG tablet Take 1 tablet (80 mg total) by mouth daily. 10/09/20  Yes Minna Merritts, MD  carvedilol (COREG) 3.125 MG tablet  Take 1 tablet (3.125 mg total) by mouth 2 (two) times daily with a meal. 10/09/20  Yes Gollan, Kathlene November, MD  clopidogrel (PLAVIX) 75 MG tablet Take 1 tablet (75 mg total) by mouth daily. 10/09/20  Yes Gollan, Kathlene November, MD  DULoxetine (CYMBALTA) 30 MG capsule Take 30 mg by mouth daily.    Yes [provider]  ELIQUIS 5 MG TABS tablet Take 5 mg by mouth 2 (two) times daily. 02/02/20  Yes [provider]  empagliflozin (JARDIANCE) 10 MG TABS tablet Take 10 mg by mouth daily.  02/03/20  Yes [provider]  ezetimibe (ZETIA) 10 MG tablet Take 1 tablet (10 mg total) by mouth daily. 10/09/20  Yes Minna Merritts, MD  gabapentin (NEURONTIN) 800 MG tablet Take 800 mg by mouth 4 (four) times daily.  08/14/17 04/04/29 Yes [provider]  ipratropium-albuterol (DUONEB) 0.5-2.5 (3) MG/3ML SOLN Inhale 3 mLs into the lungs every 6 (six) hours as needed (wheezing/sob).    Yes [provider]  isosorbide mononitrate (IMDUR) 30 MG 24 hr tablet Take 1 tablet (30 mg total) by mouth daily. 10/09/20  Yes Minna Merritts, MD  losartan (COZAAR) 25 MG tablet Take 0.5 tablets (12.5 mg total) by mouth daily. 10/09/20  Yes Minna Merritts, MD  metolazone (ZAROXOLYN) 2.5 MG tablet Take 1 tablet (2.5 mg total) by mouth daily. Take 1/2 hour before morning torsemide 12/12/20 03/12/21 Yes Demarco Bacci A, FNP  Multiple Vitamin (MULTI-VITAMINS) TABS Take 1 tablet by  mouth daily.    Yes [provider]  nitroGLYCERIN (NITROSTAT) 0.4 MG SL tablet Place 1 tablet (0.4 mg total) under the tongue every 5 (five) minutes as needed for chest pain. 10/09/20  Yes Minna Merritts, MD  omeprazole (PRILOSEC) 40 MG capsule Take 40 mg by mouth daily.   Yes [provider]  Oxycodone HCl 10 MG TABS Take 10 mg by mouth every 6 (six) hours as needed (pain).  01/15/18  Yes [provider]  polyethylene glycol (MIRALAX / GLYCOLAX) 17 g packet Take 17 g by mouth daily as needed for mild constipation. 08/12/20  Yes Wieting, Richard, MD  potassium chloride (KLOR-CON) 10 MEQ tablet Take 1 tablet (10 mEq total) by mouth 2 (two) times daily. 10/09/20  Yes Minna Merritts, MD  SYMBICORT 80-4.5 MCG/ACT inhaler Inhale 1 puff into the lungs 2 (two) times daily. 01/01/20  Yes [provider]  tamsulosin (FLOMAX) 0.4 MG CAPS capsule Take 1 capsule (0.4 mg total) by mouth daily after supper. 07/01/20  Yes Shawna Clamp, MD  torsemide (DEMADEX) 20 MG tablet Take 2 tablets (40 mg total) by mouth daily. Patient taking differently: Take 40 mg by mouth 2 (two) times daily. 10/09/20  Yes Minna Merritts, MD  traZODone (DESYREL) 150 MG tablet Take 150 mg by mouth at bedtime. 07/12/19  Yes [provider]  digoxin (LANOXIN) 0.125 MG tablet Take 1 tablet (0.125 mg total) by mouth daily. 10/09/20   Minna Merritts, MD  spironolactone (ALDACTONE) 25 MG tablet Take 0.5 tablets (12.5 mg total) by mouth daily. 10/09/20   Minna Merritts, MD  VERQUVO 10 MG TABS TAKE 1 TABLET BY MOUTH ONCE DAILY 11/16/20   Alisa Graff, FNP   Review of Systems  Constitutional:  Positive for fatigue. Negative for appetite change.  HENT:  Negative for congestion, rhinorrhea, sore throat and trouble swallowing.   Eyes: Negative.   Respiratory:  Positive for shortness of breath. Negative for cough.  Cardiovascular:  Positive for chest pain (at times) and leg swelling. Negative for  palpitations.  Gastrointestinal:  Positive for abdominal distention. Negative for abdominal pain.  Endocrine: Negative.   Genitourinary: Negative.   Musculoskeletal:  Positive for arthralgias (right shoulder).  Skin:        Left lower leg wrapped in [employer] boot  Allergic/Immunologic: Negative.   Neurological:  Positive for light-headedness. Negative for dizziness.  Hematological:  Negative for adenopathy. Does not bruise/bleed easily.  Psychiatric/Behavioral:  Positive for sleep disturbance. Negative for dysphoric mood. The patient is not nervous/anxious.    Vitals:   12/14/20 1029  BP: (!) 113/53  Pulse: 65  Resp: 18  SpO2: 94%  Weight: 204 lb 8 oz (92.8 kg)  Height: '5\' 7"'$  (1.702 m)   Wt Readings from Last 3 Encounters:  12/14/20 204 lb 8 oz (92.8 kg)  12/08/20 202 lb (91.6 kg)  11/30/20 197 lb 3.2 oz (89.4 kg)   Lab Results  Component Value Date   CREATININE 1.24 09/28/2020   CREATININE 1.50 (H) 08/22/2020   CREATININE 1.23 08/12/2020    Physical Exam Vitals and nursing note reviewed.  Constitutional:      Appearance: Normal appearance.  HENT:     Head: Normocephalic and atraumatic.  Cardiovascular:     Rate and Rhythm: Normal rate and regular rhythm.  Pulmonary:     Effort: Pulmonary effort is normal. No respiratory distress.     Breath sounds: No wheezing or rales.  Abdominal:     General: There is distension.     Palpations: Abdomen is soft.     Tenderness: There is no abdominal tenderness.  Musculoskeletal:     Cervical back: Normal range of motion and neck supple.     Left lower leg: Edema (wrapped in [employer]) present.     Comments: Right lower leg prosthesis  Skin:    General: Skin is warm and dry.  Neurological:     General: No focal deficit present.     Mental Status: He is alert and oriented to person, place, and time.  Psychiatric:        Mood and Affect: Mood normal.        Behavior: Behavior normal.        Thought Content: Thought content  normal.    Assessment & Plan:  1: Acute on Chronic heart failure with reduced ejection fraction- - NYHA class III - fluid overloaded today with edema and weight gain - weighing daily; reminded to call for an overnight weight gain of >2 pounds or a weekly weight gain of >5 pounds - weight up 7 pounds from last visit 3 weeks ago - picked up the metolazone 2.'5mg'$  but he hasn't started taking it yet because he thought he was doing better; advised him to start taking it and if he only needs 1 or 2 doses, he doesn't have to take all 3 doses. Advised to take it once a day 1/2 hour before he takes his torsemide - will check BMP at next visit - saw cardiology Rockey Situ) 10/09/20 - saw pulmonology Raul Del) 10/18/20 - palliative care visit done 08/03/20 - BNP 09/28/20 was 1131.5 - participating in paramedicine program   2: HTN- - BP looks good today - saw PCP Jodi Mourning) 10/02/20 - BMP done 09/28/20 reviewed and shows sodium 133, potassium 4.1, creatinine 1.24 and GFR >60  3: Tobacco use- - currently smoking ~ 3 cigarettes daily - complete cessation discussed for 3 minutes with him  4: Severe  atherosclerosis of lower extremities- - had left lower leg angiogram 07/17/20 - saw vascular Owens Shark) 07/05/20 - 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 Each medication was verbally reviewed with the patient and he was encouraged to bring the bottles to every visit to confirm accuracy of list.   Return in 1 month or sooner for any questions/problems before then.

## 2020-12-14 ENCOUNTER — Other Ambulatory Visit: Payer: Self-pay

## 2020-12-14 ENCOUNTER — Ambulatory Visit: Payer: Medicare Other | Attending: Family | Admitting: Family

## 2020-12-14 ENCOUNTER — Encounter: Payer: Self-pay | Admitting: Family

## 2020-12-14 VITALS — BP 113/53 | HR 65 | Resp 18 | Ht 67.0 in | Wt 204.5 lb

## 2020-12-14 DIAGNOSIS — E1151 Type 2 diabetes mellitus with diabetic peripheral angiopathy without gangrene: Secondary | ICD-10-CM | POA: Insufficient documentation

## 2020-12-14 DIAGNOSIS — I5042 Chronic combined systolic (congestive) and diastolic (congestive) heart failure: Secondary | ICD-10-CM | POA: Diagnosis not present

## 2020-12-14 DIAGNOSIS — F17210 Nicotine dependence, cigarettes, uncomplicated: Secondary | ICD-10-CM | POA: Insufficient documentation

## 2020-12-14 DIAGNOSIS — J449 Chronic obstructive pulmonary disease, unspecified: Secondary | ICD-10-CM | POA: Insufficient documentation

## 2020-12-14 DIAGNOSIS — Z7951 Long term (current) use of inhaled steroids: Secondary | ICD-10-CM | POA: Insufficient documentation

## 2020-12-14 DIAGNOSIS — Z6832 Body mass index (BMI) 32.0-32.9, adult: Secondary | ICD-10-CM | POA: Insufficient documentation

## 2020-12-14 DIAGNOSIS — Z7984 Long term (current) use of oral hypoglycemic drugs: Secondary | ICD-10-CM | POA: Insufficient documentation

## 2020-12-14 DIAGNOSIS — Z8249 Family history of ischemic heart disease and other diseases of the circulatory system: Secondary | ICD-10-CM | POA: Insufficient documentation

## 2020-12-14 DIAGNOSIS — Z89511 Acquired absence of right leg below knee: Secondary | ICD-10-CM | POA: Diagnosis not present

## 2020-12-14 DIAGNOSIS — I252 Old myocardial infarction: Secondary | ICD-10-CM | POA: Insufficient documentation

## 2020-12-14 DIAGNOSIS — I5043 Acute on chronic combined systolic (congestive) and diastolic (congestive) heart failure: Secondary | ICD-10-CM

## 2020-12-14 DIAGNOSIS — Z7901 Long term (current) use of anticoagulants: Secondary | ICD-10-CM | POA: Diagnosis not present

## 2020-12-14 DIAGNOSIS — Z955 Presence of coronary angioplasty implant and graft: Secondary | ICD-10-CM | POA: Insufficient documentation

## 2020-12-14 DIAGNOSIS — E785 Hyperlipidemia, unspecified: Secondary | ICD-10-CM | POA: Diagnosis not present

## 2020-12-14 DIAGNOSIS — I2510 Atherosclerotic heart disease of native coronary artery without angina pectoris: Secondary | ICD-10-CM | POA: Insufficient documentation

## 2020-12-14 DIAGNOSIS — I110 Hypertensive heart disease with heart failure: Secondary | ICD-10-CM | POA: Insufficient documentation

## 2020-12-14 DIAGNOSIS — Z720 Tobacco use: Secondary | ICD-10-CM

## 2020-12-14 DIAGNOSIS — I255 Ischemic cardiomyopathy: Secondary | ICD-10-CM | POA: Diagnosis not present

## 2020-12-14 DIAGNOSIS — G4733 Obstructive sleep apnea (adult) (pediatric): Secondary | ICD-10-CM | POA: Insufficient documentation

## 2020-12-14 DIAGNOSIS — Z7902 Long term (current) use of antithrombotics/antiplatelets: Secondary | ICD-10-CM | POA: Diagnosis not present

## 2020-12-14 DIAGNOSIS — E1140 Type 2 diabetes mellitus with diabetic neuropathy, unspecified: Secondary | ICD-10-CM | POA: Diagnosis not present

## 2020-12-14 DIAGNOSIS — I10 Essential (primary) hypertension: Secondary | ICD-10-CM

## 2020-12-14 DIAGNOSIS — E1159 Type 2 diabetes mellitus with other circulatory complications: Secondary | ICD-10-CM

## 2020-12-14 DIAGNOSIS — I70219 Atherosclerosis of native arteries of extremities with intermittent claudication, unspecified extremity: Secondary | ICD-10-CM

## 2020-12-14 NOTE — Patient Instructions (Signed)
Continue weighing daily and call for an overnight weight gain of > 2 pounds or a weekly weight gain of >5 pounds. 

## 2020-12-15 ENCOUNTER — Encounter (INDEPENDENT_AMBULATORY_CARE_PROVIDER_SITE_OTHER): Payer: Medicare Other

## 2020-12-18 ENCOUNTER — Encounter (INDEPENDENT_AMBULATORY_CARE_PROVIDER_SITE_OTHER): Payer: Self-pay | Admitting: Nurse Practitioner

## 2020-12-19 ENCOUNTER — Other Ambulatory Visit (INDEPENDENT_AMBULATORY_CARE_PROVIDER_SITE_OTHER): Payer: Self-pay | Admitting: Nurse Practitioner

## 2020-12-19 ENCOUNTER — Ambulatory Visit (INDEPENDENT_AMBULATORY_CARE_PROVIDER_SITE_OTHER): Payer: Medicare Other | Admitting: Nurse Practitioner

## 2020-12-19 ENCOUNTER — Other Ambulatory Visit: Payer: Self-pay

## 2020-12-19 ENCOUNTER — Encounter (INDEPENDENT_AMBULATORY_CARE_PROVIDER_SITE_OTHER): Payer: Self-pay

## 2020-12-19 VITALS — BP 99/59 | HR 70 | Resp 16 | Wt 193.0 lb

## 2020-12-19 DIAGNOSIS — L97929 Non-pressure chronic ulcer of unspecified part of left lower leg with unspecified severity: Secondary | ICD-10-CM

## 2020-12-19 DIAGNOSIS — I83029 Varicose veins of left lower extremity with ulcer of unspecified site: Secondary | ICD-10-CM

## 2020-12-19 MED ORDER — SULFAMETHOXAZOLE-TRIMETHOPRIM 800-160 MG PO TABS: 1.0000 | ORAL_TABLET | Freq: Two times a day (BID) | ORAL | 0 refills | Status: DC

## 2020-12-19 NOTE — Progress Notes (Signed)
History of Present Illness  There is no documented history at this time  Assessments & Plan   There are no diagnoses linked to this encounter.    Additional instructions  Subjective:  Patient presents with venous ulcer of the Left lower extremity.    Procedure:  3 layer unna wrap was placed Left lower extremity.   Plan:   Follow up in one week.  

## 2020-12-23 ENCOUNTER — Encounter (INDEPENDENT_AMBULATORY_CARE_PROVIDER_SITE_OTHER): Payer: Self-pay | Admitting: Nurse Practitioner

## 2020-12-26 ENCOUNTER — Ambulatory Visit (INDEPENDENT_AMBULATORY_CARE_PROVIDER_SITE_OTHER): Payer: Medicare Other | Admitting: Nurse Practitioner

## 2020-12-26 ENCOUNTER — Other Ambulatory Visit: Payer: Self-pay

## 2020-12-26 ENCOUNTER — Telehealth (HOSPITAL_COMMUNITY): Payer: Self-pay

## 2020-12-26 VITALS — BP 99/58 | HR 83 | Ht 67.0 in | Wt 190.0 lb

## 2020-12-26 DIAGNOSIS — I83029 Varicose veins of left lower extremity with ulcer of unspecified site: Secondary | ICD-10-CM | POA: Diagnosis not present

## 2020-12-26 DIAGNOSIS — L97929 Non-pressure chronic ulcer of unspecified part of left lower leg with unspecified severity: Secondary | ICD-10-CM

## 2020-12-26 NOTE — Telephone Encounter (Signed)
Attempted to contact, he was not home.  Left message for him to contact me back to set up home visit.   [place] [phone]

## 2020-12-26 NOTE — Progress Notes (Signed)
History of Present Illness  There is no documented history at this time  Assessments & Plan   There are no diagnoses linked to this encounter.    Additional instructions  Subjective:  Patient presents with venous ulcer of the Left lower extremity.    Procedure:  3 layer unna wrap was placed Left lower extremity.   Plan:   Follow up in one week.  

## 2021-01-01 ENCOUNTER — Encounter (INDEPENDENT_AMBULATORY_CARE_PROVIDER_SITE_OTHER): Payer: Self-pay | Admitting: Nurse Practitioner

## 2021-01-01 ENCOUNTER — Encounter (HOSPITAL_COMMUNITY): Payer: Self-pay

## 2021-01-01 ENCOUNTER — Other Ambulatory Visit (HOSPITAL_COMMUNITY): Payer: Self-pay

## 2021-01-01 NOTE — Progress Notes (Signed)
Today had a home visit with [name] [name].  He staes been doing well.  He has his leg wrapped.  Swelling has went down.  He denies any problems such as chest pain, headaches, dizziness, or increased shortness of breath  He states stays active.  His power chair is broke and in process of getting a new one.  He has all his medications and aware of how to take them.  Aware of up coming appts.  He watches high sodium foods and how much fluids he is in taking.  He is drinking some Mt Dew but not many a day.  His A1C has come down.  He states feeling ok for everything wrong with him.  He has family support by his girlfriend that lives with him.  He denies needing anything.  Will continue to visit for heart failure, diet and medication management.   [place] [phone]

## 2021-01-02 ENCOUNTER — Encounter (INDEPENDENT_AMBULATORY_CARE_PROVIDER_SITE_OTHER): Payer: Self-pay

## 2021-01-02 ENCOUNTER — Other Ambulatory Visit: Payer: Self-pay

## 2021-01-02 ENCOUNTER — Ambulatory Visit (INDEPENDENT_AMBULATORY_CARE_PROVIDER_SITE_OTHER): Payer: Medicare Other | Admitting: Nurse Practitioner

## 2021-01-02 VITALS — BP 105/54 | HR 73 | Resp 16 | Wt 184.6 lb

## 2021-01-02 DIAGNOSIS — I83029 Varicose veins of left lower extremity with ulcer of unspecified site: Secondary | ICD-10-CM | POA: Diagnosis not present

## 2021-01-02 DIAGNOSIS — L97929 Non-pressure chronic ulcer of unspecified part of left lower leg with unspecified severity: Secondary | ICD-10-CM | POA: Diagnosis not present

## 2021-01-02 NOTE — Progress Notes (Signed)
History of Present Illness  There is no documented history at this time  Assessments & Plan   There are no diagnoses linked to this encounter.    Additional instructions  Subjective:  Patient presents with venous ulcer of the Left lower extremity.    Procedure:  3 layer unna wrap was placed Left lower extremity.   Plan:   Follow up in one week.  

## 2021-01-09 ENCOUNTER — Ambulatory Visit (INDEPENDENT_AMBULATORY_CARE_PROVIDER_SITE_OTHER): Payer: Medicare Other | Admitting: Nurse Practitioner

## 2021-01-10 ENCOUNTER — Ambulatory Visit (INDEPENDENT_AMBULATORY_CARE_PROVIDER_SITE_OTHER): Payer: Medicare Other | Admitting: Nurse Practitioner

## 2021-01-10 ENCOUNTER — Other Ambulatory Visit: Payer: Self-pay

## 2021-01-10 VITALS — BP 85/50 | HR 58 | Ht 67.0 in | Wt 185.0 lb

## 2021-01-10 DIAGNOSIS — L97929 Non-pressure chronic ulcer of unspecified part of left lower leg with unspecified severity: Secondary | ICD-10-CM

## 2021-01-10 DIAGNOSIS — I83029 Varicose veins of left lower extremity with ulcer of unspecified site: Secondary | ICD-10-CM

## 2021-01-10 DIAGNOSIS — I70245 Atherosclerosis of native arteries of left leg with ulceration of other part of foot: Secondary | ICD-10-CM

## 2021-01-18 ENCOUNTER — Other Ambulatory Visit: Payer: Self-pay

## 2021-01-18 ENCOUNTER — Ambulatory Visit (HOSPITAL_BASED_OUTPATIENT_CLINIC_OR_DEPARTMENT_OTHER): Payer: Medicare Other | Admitting: Family

## 2021-01-18 ENCOUNTER — Encounter: Payer: Self-pay | Admitting: Family

## 2021-01-18 ENCOUNTER — Other Ambulatory Visit
Admission: RE | Admit: 2021-01-18 | Discharge: 2021-01-18 | Disposition: A | Discharge status: Home / Self Care | Payer: Medicare Other | Source: Ambulatory Visit | Attending: Family | Admitting: Family

## 2021-01-18 VITALS — BP 117/82 | HR 90 | Resp 16 | Ht 67.0 in | Wt 192.4 lb

## 2021-01-18 DIAGNOSIS — Z7901 Long term (current) use of anticoagulants: Secondary | ICD-10-CM | POA: Insufficient documentation

## 2021-01-18 DIAGNOSIS — I5022 Chronic systolic (congestive) heart failure: Secondary | ICD-10-CM

## 2021-01-18 DIAGNOSIS — J449 Chronic obstructive pulmonary disease, unspecified: Secondary | ICD-10-CM | POA: Diagnosis not present

## 2021-01-18 DIAGNOSIS — Z7951 Long term (current) use of inhaled steroids: Secondary | ICD-10-CM | POA: Diagnosis not present

## 2021-01-18 DIAGNOSIS — I255 Ischemic cardiomyopathy: Secondary | ICD-10-CM | POA: Insufficient documentation

## 2021-01-18 DIAGNOSIS — Z7902 Long term (current) use of antithrombotics/antiplatelets: Secondary | ICD-10-CM | POA: Diagnosis not present

## 2021-01-18 DIAGNOSIS — G2581 Restless legs syndrome: Secondary | ICD-10-CM | POA: Diagnosis not present

## 2021-01-18 DIAGNOSIS — G4733 Obstructive sleep apnea (adult) (pediatric): Secondary | ICD-10-CM | POA: Diagnosis not present

## 2021-01-18 DIAGNOSIS — Z7984 Long term (current) use of oral hypoglycemic drugs: Secondary | ICD-10-CM | POA: Insufficient documentation

## 2021-01-18 DIAGNOSIS — E1159 Type 2 diabetes mellitus with other circulatory complications: Secondary | ICD-10-CM

## 2021-01-18 DIAGNOSIS — F17210 Nicotine dependence, cigarettes, uncomplicated: Secondary | ICD-10-CM | POA: Insufficient documentation

## 2021-01-18 DIAGNOSIS — I70219 Atherosclerosis of native arteries of extremities with intermittent claudication, unspecified extremity: Secondary | ICD-10-CM

## 2021-01-18 DIAGNOSIS — Z89511 Acquired absence of right leg below knee: Secondary | ICD-10-CM | POA: Insufficient documentation

## 2021-01-18 DIAGNOSIS — Z8349 Family history of other endocrine, nutritional and metabolic diseases: Secondary | ICD-10-CM | POA: Diagnosis not present

## 2021-01-18 DIAGNOSIS — E785 Hyperlipidemia, unspecified: Secondary | ICD-10-CM | POA: Insufficient documentation

## 2021-01-18 DIAGNOSIS — Z79899 Other long term (current) drug therapy: Secondary | ICD-10-CM | POA: Insufficient documentation

## 2021-01-18 DIAGNOSIS — I110 Hypertensive heart disease with heart failure: Secondary | ICD-10-CM | POA: Diagnosis not present

## 2021-01-18 DIAGNOSIS — E1140 Type 2 diabetes mellitus with diabetic neuropathy, unspecified: Secondary | ICD-10-CM | POA: Diagnosis not present

## 2021-01-18 DIAGNOSIS — Z720 Tobacco use: Secondary | ICD-10-CM

## 2021-01-18 DIAGNOSIS — E1151 Type 2 diabetes mellitus with diabetic peripheral angiopathy without gangrene: Secondary | ICD-10-CM | POA: Diagnosis not present

## 2021-01-18 DIAGNOSIS — Z955 Presence of coronary angioplasty implant and graft: Secondary | ICD-10-CM | POA: Diagnosis not present

## 2021-01-18 DIAGNOSIS — I10 Essential (primary) hypertension: Secondary | ICD-10-CM

## 2021-01-18 DIAGNOSIS — I2510 Atherosclerotic heart disease of native coronary artery without angina pectoris: Secondary | ICD-10-CM | POA: Diagnosis not present

## 2021-01-18 DIAGNOSIS — Z8249 Family history of ischemic heart disease and other diseases of the circulatory system: Secondary | ICD-10-CM | POA: Insufficient documentation

## 2021-01-18 DIAGNOSIS — I252 Old myocardial infarction: Secondary | ICD-10-CM | POA: Diagnosis not present

## 2021-01-18 DIAGNOSIS — Z888 Allergy status to other drugs, medicaments and biological substances status: Secondary | ICD-10-CM | POA: Diagnosis not present

## 2021-01-18 LAB — BASIC METABOLIC PANEL
Anion gap: 8 (ref 5–15)
BUN: 22 mg/dL — ABNORMAL HIGH (ref 6–20)
CO2: 27 mmol/L (ref 22–32)
Calcium: 9.2 mg/dL (ref 8.9–10.3)
Chloride: 97 mmol/L — ABNORMAL LOW (ref 98–111)
Creatinine, Ser: 1.39 mg/dL — ABNORMAL HIGH (ref 0.61–1.24)
GFR, Estimated: 59 mL/min — ABNORMAL LOW (ref >60)
Glucose, Bld: 106 mg/dL — ABNORMAL HIGH (ref 70–99)
Potassium: 4.3 mmol/L (ref 3.5–5.1)
Sodium: 132 mmol/L — ABNORMAL LOW (ref 135–145)

## 2021-01-18 NOTE — Patient Instructions (Signed)
Continue weighing daily and call for an overnight weight gain of > 2 pounds or a weekly weight gain of >5 pounds. 

## 2021-01-18 NOTE — Progress Notes (Signed)
Patient ID: [name] [name], male    DOB: 12-24-1960, 60 y.o.   MRN: [id number]  HPI  [name] [name] is a 60 y/o male with a history of obstructive sleep apnea, PVD, PAD, HTN, hyperlipidemia, DM, CAD, COPD, BPH, asthma, current tobacco use and chronic heart failure.   Echo report from 06/17/20 reviewed and showed an EF of <20% along with elevated PA pressure of 53.2 mmHg and mild/ moderate TR. Echo report from 01/20/20 reviewed and showed an EF of <20% along with mild [name] and moderate TR. Echo report from 09/29/19 reviewed and showed an EF of 20-25% along with moderately elevated PA pressure, mild [name] and mild/moderate TR. Echo report from 11/06/2018 reviewed and showed an EF of 20-25% along with moderate TR and a PA pressure of 59.6 mmHg. Echo report from 04/25/17 reviewed and shows an EF of 25-30% along with a PA pressure of 34 mm Hg. EF has declined from 55-60% back in 2016.   LHC done 01/20/20 showed: Prox RCA-1 lesion is 80% stenosed. Prox RCA-2 lesion is 20% stenosed. Prox RCA to Mid RCA lesion is 80% stenosed. Prox LAD lesion is 20% stenosed. 1st Diag lesion is 80% stenosed. Mid LAD lesion is 20% stenosed. Dist LAD lesion is 60% stenosed. Prox Cx to Mid Cx lesion is 90% stenosed. LPAV lesion is 95% stenosed.   1. Significant underlying three-vessel coronary artery disease. Patent proximal LAD stent with mild in-stent restenosis. Moderate diffuse disease in the distal LAD. Significant stenosis in the proximal left circumflex at the origin of the posterior AV groove artery which is also heavily diseased at the ostium. This is a bifurcation lesion and heavily calcified. RCA stent is patent. However, there is significant proximal disease in the whole mid to distal segment is diffusely diseased and small caliber. 2. Left ventricular angiography was not performed. EF was severe reduced by echo. 3. Moderately elevated left ventricular end-diastolic pressure at 28 mmHg  Cardiac catheterization done 04/28/17  showed significant three-vessel disease with a patient stent in the RCA and LAD. Significant proximal RCS disease and the stent as well as diffuse mid and distal disease. LAD has moderate disease. Severely elevated left ventricular end-diastolic pressure at 34 mmHg. Possible CABG in the future with optimizing medical management. Stress done in 2015.  Was in the ED 08/22/20 due to upper back and shoulder pain. Hypotensive so IVF given along with antibiotics. Symptoms improved and [name] [name] was released. Admitted 08/05/20 due to Osteomyelitis of the left second toe with diabetic foot infection and cellulitis. Podiatry and cardiology consults obtained. Amputation of the left second toe performed on 08/11/2020. Given IV antibiotics and then transitioned to oral medications. Given IV lasix due to HF exacerbation and then switched to oral diuretics.   Discharged after 7 days. Admitted 06/17/20 due to HF exacerbation. Placed on milrinone and Lasix infusion and then transitioned to oral diuretics. Nephrology, cardiology, palliative care and wound consults obtained. Discharged after 14 days.   [name] [name] presents today for a follow-up visit with a chief complaint of minimal shortness of breath upon moderate exertion. [name] [name] describes this as chronic in nature having been present for several years. [name] [name] has associated fatigue, intermittent chest pain, pedal edema (improving), light-headedness, abdominal distention, easy bruising, difficulty sleeping at times and depression along with this. [name] [name] denies any palpitations, cough or weight gain.   [name] [name] says that [name] [name]'s been getting outside more and feels like that is helping his mental state. [name] [name]'s wrapping his leg at home now.  Past Medical History:  Diagnosis Date   Acute on chronic heart failure ([place])    AKI (acute kidney injury) ([place]) 01/03/2018   Angina at rest [facility]) 08/13/2017   Arthritis    Asthma    Atherosclerosis    BPH (benign prostatic hyperplasia)    Carotid arterial disease ([place])     Cellulitis of foot, left 09/27/2019   Charcot's joint of foot, right    Chronic combined systolic (congestive) and diastolic (congestive) heart failure ([place])    a. 02/2013 EF 50% by LV gram; b. 04/2017 Echo: EF 25-30%. diff HK. Gr2 DD. Mod dil LA/RV. PASP 37mHg.   Chronic foot pain (Secondary Area of Pain) (Right) 01/29/2018   COPD (chronic obstructive pulmonary disease) (HCC)    COPD with acute exacerbation ([place] 01/30/2020   Coronary artery disease    a. 2013 S/P PCI of LAD ([facility];  b. 03/2013 PCI: RCA 90p ( 2.5 x 23 mm DES); c. 02/2014 Cath: patent RCA stent->Med Rx; d. 04/2017 Cath: LM nl, LAd 30ost/p, 633m, D1 80ost, LCX 90p/m, OM1 85, RCA 70/20p, 8027m0d->Referred for CT Surg-felt to be poor candidate.   Coronary artery disease of native artery of native heart with stable angina pectoris ([place]CC[place]/08/2017   [facility] Nuclear stress test showed inferior wall defect. Cardiac catheterization showed patent LAD stent with 90% stenosis in the proximal RCA. Ejection fraction was 50%. Status post PCI of the RCA with a 2.5 x 23 mm drug-eluting stent   Diabetic neuropathy (HCC[place]  Diabetic ulcer of right foot ([place]CC[place]  Hernia    HFrEF (heart failure with reduced ejection fraction) ([place]CC[place]/22/2021   Hyperlipidemia    Hypertension    Ischemic cardiomyopathy    a. 04/2017 Echo: EF 25-30%; b. 08/2017 Cardiac MRI ([facility]): EF 19%, sev glob HK. RVEF 20%, mod BAE, triv [name], mild-mod TR. Basal lateral subendocardial infarct (viable), inf/infsept, dist septal ischemia, basal to mid lat peri-infarct ischemia.   Morbid obesity ([place]CC[place]  Neuropathy    PAD (peripheral artery disease) ([place]CC[place]  a. Followed by Dr. DewLucky Cowboy. 08/2010 Periph Angio: RSFA 70-80p (6X60 self-expanding stent); c. 09/2010 Periph Angio: L SFA 100p (7x unknown length self-expanding stent); d. 06/2015 Periph Angio: R SFA short segment occlusion (6x12 self-expanding stent).    Restless leg syndrome    Sepsis ([place]CC[place]0/21/2019   Sleep apnea    Subclavian artery stenosis, right (HCC)    Syncope and collapse    Tobacco use    a. 75+ yr hx - still smoking 1ppd, down from 2 ppd.   Type II diabetes mellitus (HCC)    Unstable angina ([place]CCSuncoast Estates/06/2016   Varicose vein    Past Surgical History:  Procedure Laterality Date   ABDOMINAL AORTAGRAM N/A 06/23/2013   Procedure: ABDOMINAL AORMaxcine HamSurgeon: MuhWellington HampshireD;  [facility];  Service: Cardiovascular;  Laterality: N/A;   AMPUTATION Right 04/01/2018   Procedure: AMPUTATION BELOW KNEE;  Surgeon: DewAlgernon HuxleyD;  Location: [facility] ORS;  Service: Vascular;  Laterality: Right;   AMPUTATION TOE Left 08/11/2020   Procedure: AMPUTATION TOE-2nd Toe;  Surgeon: FowSamara DeistPM;  Location: [facility] ORS;  Service: Podiatry;  Laterality: Left;   APPENDECTOMY     C[facility] PR[facility]   [facility]  [facility]ARD[facility]/2014   [facility]   CLAVICLE SURGERY     HERNIA REPAIR     IRRIGATION AND DEBRIDEMENT FOOT Right 01/04/2018   Procedure: IRRIGATION AND DEBRIDEMENT FOOT;  Surgeon: Samara Deist, DPM;  Location: [facility] ORS;  Service: Podiatry;  Laterality: Right;   LEFT HEART CATH AND CORONARY ANGIOGRAPHY N/A 01/20/2020   Procedure: LEFT HEART CATH AND CORONARY ANGIOGRAPHY;  Surgeon: Wellington Hampshire, MD;  Location: [place] CV LAB;  Service: Cardiovascular;  Laterality: N/A;   LEFT HEART CATH AND CORS/GRAFTS ANGIOGRAPHY N/A 04/28/2017   Procedure: LEFT HEART CATH AND CORONARY ANGIOGRAPHY;  Surgeon: Wellington Hampshire, MD;  Location: [place] CV LAB;  Service: Cardiovascular;  Laterality: N/A;   LOWER EXTREMITY ANGIOGRAPHY Right 03/03/2018   Procedure: LOWER EXTREMITY ANGIOGRAPHY;  Surgeon: Katha Cabal, MD;  Location: [place] CV LAB;  Service: Cardiovascular;  Laterality: Right;   LOWER EXTREMITY ANGIOGRAPHY Left 08/24/2018    Procedure: LOWER EXTREMITY ANGIOGRAPHY;  Surgeon: Algernon Huxley, MD;  Location: [place] [facility] LAB;  Service: Cardiovascular;  Laterality: Left;   LOWER EXTREMITY ANGIOGRAPHY Left 06/14/2019   Procedure: LOWER EXTREMITY ANGIOGRAPHY;  Surgeon: Algernon Huxley, MD;  Location: [place] CV LAB;  Service: Cardiovascular;  Laterality: Left;   LOWER EXTREMITY ANGIOGRAPHY Left 08/02/2019   Procedure: LOWER EXTREMITY ANGIOGRAPHY;  Surgeon: Algernon Huxley, MD;  Location: [place] CV LAB;  Service: Cardiovascular;  Laterality: Left;   LOWER EXTREMITY ANGIOGRAPHY Left 07/17/2020   Procedure: LOWER EXTREMITY ANGIOGRAPHY;  Surgeon: Algernon Huxley, MD;  Location: [place] CV LAB;  Service: Cardiovascular;  Laterality: Left;   PERIPHERAL ARTERIAL STENT GRAFT     x2 left/right   PERIPHERAL VASCULAR BALLOON ANGIOPLASTY Right 01/06/2018   Procedure: PERIPHERAL VASCULAR BALLOON ANGIOPLASTY;  Surgeon: Katha Cabal, MD;  Location: [place] CV LAB;  Service: Cardiovascular;  Laterality: Right;   PERIPHERAL VASCULAR CATHETERIZATION N/A 07/06/2015   Procedure: Abdominal Aortogram w/Lower Extremity;  Surgeon: Algernon Huxley, MD;  Location: [place] CV LAB;  Service: Cardiovascular;  Laterality: N/A;   PERIPHERAL VASCULAR CATHETERIZATION  07/06/2015   Procedure: Lower Extremity Intervention;  Surgeon: Algernon Huxley, MD;  Location: [place] CV LAB;  Service: Cardiovascular;;   Family History  Problem Relation Age of Onset   Heart attack Father    Heart disease Father    Hypertension Father    Hyperlipidemia Father    Hypertension Mother    Hyperlipidemia Mother    Social History   Tobacco Use   Smoking status: Every Day    Packs/day: 1.00    Years: 41.00    Pack years: 41.00    Types: Cigarettes   Smokeless tobacco: Never  Substance Use Topics   Alcohol use: No   Allergies  Allergen Reactions   Dulaglutide Anaphylaxis, Diarrhea and Hives   Ace Inhibitors     hypotension   Other Itching     Skin itching associated with nitro patch   Prednisone Rash   Prior to Admission medications   Medication Sig Start Date End Date Taking? Authorizing Provider  albuterol (VENTOLIN HFA) 108 (90 Base) MCG/ACT inhaler Inhale into the lungs every 6 (six) hours as needed for wheezing or shortness of breath.   Yes [provider]  atorvastatin (LIPITOR) 80 MG tablet Take 1 tablet (80 mg total) by mouth daily. 10/09/20  Yes Minna Merritts, MD  carvedilol (COREG) 3.125 MG tablet Take 1 tablet (3.125 mg total) by mouth 2 (  two) times daily with a meal. 10/09/20  Yes Gollan, Kathlene November, MD  clopidogrel (PLAVIX) 75 MG tablet Take 1 tablet (75 mg total) by mouth daily. 10/09/20  Yes Minna Merritts, MD  digoxin (LANOXIN) 0.125 MG tablet Take 1 tablet (0.125 mg total) by mouth daily. 10/09/20  Yes Gollan, Kathlene November, MD  DULoxetine (CYMBALTA) 30 MG capsule Take 30 mg by mouth daily.    Yes [provider]  ELIQUIS 5 MG TABS tablet Take 5 mg by mouth 2 (two) times daily. 02/02/20  Yes [provider]  empagliflozin (JARDIANCE) 10 MG TABS tablet Take 10 mg by mouth daily.  02/03/20  Yes [provider]  ezetimibe (ZETIA) 10 MG tablet Take 1 tablet (10 mg total) by mouth daily. 10/09/20  Yes Minna Merritts, MD  gabapentin (NEURONTIN) 800 MG tablet Take 800 mg by mouth 4 (four) times daily.  08/14/17 04/04/29 Yes [provider]  ipratropium-albuterol (DUONEB) 0.5-2.5 (3) MG/3ML SOLN Inhale 3 mLs into the lungs every 6 (six) hours as needed (wheezing/sob).    Yes [provider]  isosorbide mononitrate (IMDUR) 30 MG 24 hr tablet Take 1 tablet (30 mg total) by mouth daily. 10/09/20  Yes Minna Merritts, MD  losartan (COZAAR) 25 MG tablet Take 0.5 tablets (12.5 mg total) by mouth daily. 10/09/20  Yes Minna Merritts, MD  metFORMIN (GLUCOPHAGE-XR) 500 MG 24 hr tablet Take 1,000 mg by mouth 2 (two) times daily. 01/02/21  Yes [provider]  Multiple Vitamin  (MULTI-VITAMINS) TABS Take 1 tablet by mouth daily.    Yes [provider]  nitroGLYCERIN (NITROSTAT) 0.4 MG SL tablet Place 1 tablet (0.4 mg total) under the tongue every 5 (five) minutes as needed for chest pain. 10/09/20  Yes Minna Merritts, MD  omeprazole (PRILOSEC) 40 MG capsule Take 40 mg by mouth daily.   Yes [provider]  Oxycodone HCl 10 MG TABS Take 10 mg by mouth every 6 (six) hours as needed (pain).  01/15/18  Yes [provider]  polyethylene glycol (MIRALAX / GLYCOLAX) 17 g packet Take 17 g by mouth daily as needed for mild constipation. 08/12/20  Yes Wieting, Richard, MD  potassium chloride (KLOR-CON) 10 MEQ tablet Take 1 tablet (10 mEq total) by mouth 2 (two) times daily. 10/09/20  Yes Minna Merritts, MD  spironolactone (ALDACTONE) 25 MG tablet Take 0.5 tablets (12.5 mg total) by mouth daily. 10/09/20  Yes Minna Merritts, MD  SYMBICORT 80-4.5 MCG/ACT inhaler Inhale 1 puff into the lungs 2 (two) times daily. 01/01/20  Yes [provider]  tamsulosin (FLOMAX) 0.4 MG CAPS capsule Take 1 capsule (0.4 mg total) by mouth daily after supper. 07/01/20  Yes Shawna Clamp, MD  torsemide (DEMADEX) 20 MG tablet Take 2 tablets (40 mg total) by mouth daily. Patient taking differently: Take 40 mg by mouth 2 (two) times daily. Taking 2 in the AM and 1 at lunch 10/09/20  Yes Gollan, Kathlene November, MD  traZODone (DESYREL) 150 MG tablet Take 150 mg by mouth at bedtime. 07/12/19  Yes [provider]  VERQUVO 10 MG TABS TAKE 1 TABLET BY MOUTH ONCE DAILY 11/16/20  Yes Darylene Price A, FNP  sulfamethoxazole-trimethoprim (BACTRIM DS) 800-160 MG tablet Take 1 tablet by mouth 2 (two) times daily. Patient not taking: Reported on 01/18/2021 12/19/20   Kris Hartmann, NP    Review of Systems  Constitutional:  Positive for fatigue. Negative for appetite change.  HENT:  Negative for congestion, rhinorrhea, sore throat and trouble swallowing.   Eyes: Negative.   Respiratory:   Positive for shortness of breath. Negative for cough.   Cardiovascular:  Positive for chest pain (at times) and leg swelling. Negative for palpitations.  Gastrointestinal:  Positive for abdominal distention. Negative for abdominal pain.  Endocrine: Negative.   Genitourinary: Negative.   Musculoskeletal:  Positive for arthralgias (right shoulder).  Skin:        Left lower leg wrapped in UNNA boot  Allergic/Immunologic: Negative.   Neurological:  Positive for light-headedness. Negative for dizziness.  Hematological:  Negative for adenopathy. Bruises/bleeds easily.  Psychiatric/Behavioral:  Positive for dysphoric mood and sleep disturbance (not sleeping well due to mind racing). The patient is not nervous/anxious.    Vitals:   01/18/21 1347  BP: 117/82  Pulse: 90  Resp: 16  SpO2: 99%  Weight: 192 lb 6 oz (87.3 kg)  Height: '5\' 7"'$  (1.702 m)   Wt Readings from Last 3 Encounters:  01/18/21 192 lb 6 oz (87.3 kg)  01/10/21 185 lb (83.9 kg)  01/02/21 184 lb 9.6 oz (83.7 kg)   Lab Results  Component Value Date   CREATININE 1.24 09/28/2020   CREATININE 1.50 (H) 08/22/2020   CREATININE 1.23 08/12/2020    Physical Exam Vitals and nursing note reviewed.  Constitutional:      Appearance: Normal appearance.  HENT:     Head: Normocephalic and atraumatic.  Cardiovascular:     Rate and Rhythm: Normal rate and regular rhythm.  Pulmonary:     Effort: Pulmonary effort is normal. No respiratory distress.     Breath sounds: No wheezing or rales.  Abdominal:     General: There is distension.     Palpations: Abdomen is soft.     Tenderness: There is no abdominal tenderness.  Musculoskeletal:     Cervical back: Normal range of motion and neck supple.     Left lower leg: Edema (trace edema; wrapped in [employer]) present.     Comments: Right lower leg prosthesis  Skin:    General: Skin is warm and dry.     Findings: Bruising (on arms) present.  Neurological:     General: No focal deficit  present.     Mental Status: [name] [name] is alert and oriented to person, place, and time.  Psychiatric:        Mood and Affect: Mood normal.        Behavior: Behavior normal.        Thought Content: Thought content normal.    Assessment & Plan:  1: Chronic heart failure with reduced ejection fraction- - NYHA class II - euvolemic today - weighing daily; reminded to call for an overnight weight gain of >2 pounds or a weekly weight gain of >5 pounds - weight down 12 pounds from last visit 1 month ago - taking torsemide '40mg'$  AM/ '20mg'$  PM - will check BMP today - saw cardiology Rockey Situ) 10/09/20 - saw pulmonology Raul Del) 10/18/20 - palliative care visit done 08/03/20 - BNP 09/28/20 was 1131.5 - participating in paramedicine program   2: HTN- - BP looks good today (117/82) - saw PCP Jodi Mourning) 12/27/20 - BMP done 09/28/20 reviewed and shows sodium 133, potassium 4.1, creatinine 1.24 and GFR >60  3: Tobacco use- - currently smoking ~ 3 cigarettes daily - complete cessation discussed for 3 minutes with him  4: Severe atherosclerosis of lower extremities- - had left lower leg angiogram 07/17/20 - saw vascular Owens Shark) 01/10/21 - 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 Each medication was verbally reviewed with the patient and [name] [name] was encouraged to bring the bottles to every visit to confirm accuracy of list.   Return in 4 months or sooner for any questions/problems before then.

## 2021-01-19 ENCOUNTER — Emergency Department
Admission: EM | Admit: 2021-01-19 | Discharge: 2021-01-20 | Discharge status: Against Medical Advice | Payer: Medicare Other | Attending: Emergency Medicine | Admitting: Emergency Medicine

## 2021-01-19 ENCOUNTER — Encounter: Payer: Self-pay | Admitting: Physician Assistant

## 2021-01-19 ENCOUNTER — Emergency Department: Payer: Medicare Other

## 2021-01-19 ENCOUNTER — Other Ambulatory Visit: Payer: Self-pay

## 2021-01-19 DIAGNOSIS — J449 Chronic obstructive pulmonary disease, unspecified: Secondary | ICD-10-CM | POA: Diagnosis not present

## 2021-01-19 DIAGNOSIS — I5042 Chronic combined systolic (congestive) and diastolic (congestive) heart failure: Secondary | ICD-10-CM | POA: Diagnosis not present

## 2021-01-19 DIAGNOSIS — Z955 Presence of coronary angioplasty implant and graft: Secondary | ICD-10-CM | POA: Diagnosis not present

## 2021-01-19 DIAGNOSIS — E1140 Type 2 diabetes mellitus with diabetic neuropathy, unspecified: Secondary | ICD-10-CM | POA: Insufficient documentation

## 2021-01-19 DIAGNOSIS — Z7901 Long term (current) use of anticoagulants: Secondary | ICD-10-CM | POA: Insufficient documentation

## 2021-01-19 DIAGNOSIS — J45909 Unspecified asthma, uncomplicated: Secondary | ICD-10-CM | POA: Insufficient documentation

## 2021-01-19 DIAGNOSIS — Z7984 Long term (current) use of oral hypoglycemic drugs: Secondary | ICD-10-CM | POA: Insufficient documentation

## 2021-01-19 DIAGNOSIS — I110 Hypertensive heart disease with heart failure: Secondary | ICD-10-CM | POA: Diagnosis not present

## 2021-01-19 DIAGNOSIS — F17210 Nicotine dependence, cigarettes, uncomplicated: Secondary | ICD-10-CM | POA: Insufficient documentation

## 2021-01-19 DIAGNOSIS — I5022 Chronic systolic (congestive) heart failure: Secondary | ICD-10-CM

## 2021-01-19 DIAGNOSIS — R0602 Shortness of breath: Secondary | ICD-10-CM | POA: Diagnosis present

## 2021-01-19 DIAGNOSIS — I25119 Atherosclerotic heart disease of native coronary artery with unspecified angina pectoris: Secondary | ICD-10-CM | POA: Insufficient documentation

## 2021-01-19 DIAGNOSIS — Z79899 Other long term (current) drug therapy: Secondary | ICD-10-CM | POA: Diagnosis not present

## 2021-01-19 DIAGNOSIS — Z7951 Long term (current) use of inhaled steroids: Secondary | ICD-10-CM | POA: Insufficient documentation

## 2021-01-19 DIAGNOSIS — J9601 Acute respiratory failure with hypoxia: Secondary | ICD-10-CM

## 2021-01-19 DIAGNOSIS — Z20822 Contact with and (suspected) exposure to covid-19: Secondary | ICD-10-CM | POA: Diagnosis not present

## 2021-01-19 LAB — BASIC METABOLIC PANEL
Anion gap: 8 (ref 5–15)
BUN: 23 mg/dL — ABNORMAL HIGH (ref 6–20)
CO2: 30 mmol/L (ref 22–32)
Calcium: 9.3 mg/dL (ref 8.9–10.3)
Chloride: 96 mmol/L — ABNORMAL LOW (ref 98–111)
Creatinine, Ser: 1.38 mg/dL — ABNORMAL HIGH (ref 0.61–1.24)
GFR, Estimated: 59 mL/min — ABNORMAL LOW (ref >60)
Glucose, Bld: 110 mg/dL — ABNORMAL HIGH (ref 70–99)
Potassium: 3.8 mmol/L (ref 3.5–5.1)
Sodium: 134 mmol/L — ABNORMAL LOW (ref 135–145)

## 2021-01-19 LAB — TROPONIN I (HIGH SENSITIVITY): Troponin I (High Sensitivity): 299 ng/L (ref <18)

## 2021-01-19 LAB — CBC WITH DIFFERENTIAL/PLATELET
Abs Immature Granulocytes: 0.02 10*3/uL (ref 0.00–0.07)
Basophils Absolute: 0 10*3/uL (ref 0.0–0.1)
Basophils Relative: 1 %
Eosinophils Absolute: 0.1 10*3/uL (ref 0.0–0.5)
Eosinophils Relative: 2 %
HCT: 34.6 % — ABNORMAL LOW (ref 39.0–52.0)
Hemoglobin: 11.4 g/dL — ABNORMAL LOW (ref 13.0–17.0)
Immature Granulocytes: 0 %
Lymphocytes Relative: 20 %
Lymphs Abs: 0.9 10*3/uL (ref 0.7–4.0)
MCH: 26 pg (ref 26.0–34.0)
MCHC: 32.9 g/dL (ref 30.0–36.0)
MCV: 78.8 fL — ABNORMAL LOW (ref 80.0–100.0)
Monocytes Absolute: 0.4 10*3/uL (ref 0.1–1.0)
Monocytes Relative: 10 %
Neutro Abs: 3 10*3/uL (ref 1.7–7.7)
Neutrophils Relative %: 67 %
Platelets: 151 10*3/uL (ref 150–400)
RBC: 4.39 MIL/uL (ref 4.22–5.81)
RDW: 25.1 % — ABNORMAL HIGH (ref 11.5–15.5)
Smear Review: NORMAL
WBC: 4.4 10*3/uL (ref 4.0–10.5)
nRBC: 0 % (ref 0.0–0.2)

## 2021-01-19 LAB — RESP PANEL BY RT-PCR (FLU A&B, COVID) ARPGX2
Influenza A by PCR: NEGATIVE
Influenza B by PCR: NEGATIVE
SARS Coronavirus 2 by RT PCR: NEGATIVE

## 2021-01-19 LAB — BRAIN NATRIURETIC PEPTIDE: B Natriuretic Peptide: 1964.1 pg/mL — ABNORMAL HIGH (ref 0.0–100.0)

## 2021-01-19 MED ORDER — FUROSEMIDE 10 MG/ML IJ SOLN
60.0000 mg | Freq: Once | INTRAMUSCULAR | Status: AC
  Administered 2021-01-19: 60 mg via INTRAVENOUS
  Filled 2021-01-19: qty 8

## 2021-01-19 NOTE — ED Notes (Signed)
Pt ambulatory to check in desk in waiting room; was offered a wheelchair but he refused.

## 2021-01-19 NOTE — ED Triage Notes (Signed)
Patient reports having labs drawn at a [facility] office yesterday.

## 2021-01-19 NOTE — ED Triage Notes (Signed)
Patient reports cough, congestion, SOB, and fatigue x few days. Patient reports generalized body aches, and chills as well.

## 2021-01-19 NOTE — ED Notes (Signed)
Patient reports artifical LE, patient offered wheelchair, but declined.

## 2021-01-19 NOTE — ED Provider Notes (Signed)
[facility] Emergency Department Provider Note   ____________________________________________   Event Date/Time   First MD Initiated Contact with Patient 01/19/21 2026     (approximate)  I have reviewed the triage vital signs and the nursing notes.   HISTORY  Chief Complaint URI    HPI [name] [name] is a 60 y.o. male with the below medical history including chronic heart failure with reduced EF, AKI, COPD, hypertension, A. fib and peripheral vascular disease, presents to the ED for evaluation of shortness of breath.  Pain she reports onset of cough, congestion, shortness of breath for the last few days.  He is also noticing generalized body aches and chills without frank fevers.  Despite having a routine visit at the heart failure clinic yesterday, placed him presents today stating that he felt lightheaded, and fell out of the bed earlier today.  He denies any head injury or LOC.  He presents to the ED for evaluation of his symptoms. Past Medical History:  Diagnosis Date   Acute on chronic heart failure ([place])    AKI (acute kidney injury) ([place]) 01/03/2018   Angina at rest [facility]) 08/13/2017   Arthritis    Asthma    Atherosclerosis    BPH (benign prostatic hyperplasia)    Carotid arterial disease ([place])    Cellulitis of foot, left 09/27/2019   Charcot's joint of foot, right    Chronic combined systolic (congestive) and diastolic (congestive) heart failure ([place])    a. 02/2013 EF 50% by LV gram; b. 04/2017 Echo: EF 25-30%. diff HK. Gr2 DD. Mod dil LA/RV. PASP 52mHg.   Chronic foot pain (Secondary Area of Pain) (Right) 01/29/2018   COPD (chronic obstructive pulmonary disease) (HCC)    COPD with acute exacerbation (HGross 01/30/2020   Coronary artery disease    a. 2013 S/P PCI of LAD ([facility];  b. 03/2013 PCI: RCA 90p ( 2.5 x 23 mm DES); c. 02/2014 Cath: patent RCA stent->Med Rx; d. 04/2017 Cath: LM nl, LAd 30ost/p, 669m, D1 80ost, LCX 90p/m, OM1 85, RCA 70/20p, 8044m70d->Referred for CT Surg-felt to be poor candidate.   Coronary artery disease of native artery of native heart with stable angina pectoris (HCCHinckley/08/2017   [facility] Nuclear stress test showed inferior wall defect. Cardiac catheterization showed patent LAD stent with 90% stenosis in the proximal RCA. Ejection fraction was 50%. Status post PCI of the RCA with a 2.5 x 23 mm drug-eluting stent   Diabetic neuropathy (HCCEllwood City  Diabetic ulcer of right foot (HCCVicksburg  [place]    HFrEF (heart failure with reduced ejection fraction) (HCCBenzonia/22/2021   Hyperlipidemia    Hypertension    Ischemic cardiomyopathy    a. 04/2017 Echo: EF 25-30%; b. 08/2017 Cardiac MRI ([facility]): EF 19%, sev glob HK. RVEF 20%, mod BAE, triv MR, mild-mod TR. Basal lateral subendocardial infarct (viable), inf/infsept, dist septal ischemia, basal to mid lat peri-infarct ischemia.   Morbid obesity (HCCCortez  Neuropathy    PAD (peripheral artery disease) (HCCLenawee  a. Followed by Dr. DewLucky Cowboy. 08/2010 Periph Angio: RSFA 70-80p (6X60 self-expanding stent); c. 09/2010 Periph Angio: L SFA 100p (7x unknown length self-expanding stent); d. 06/2015 Periph Angio: R SFA short segment occlusion (6x12 self-expanding stent).   Restless leg syndrome    Sepsis (HCCSiesta Shores0/21/2019   Sleep apnea    Subclavian  artery stenosis, right (HCC)    Syncope and collapse    Tobacco use    a. 75+ yr hx - still smoking 1ppd, down from 2 ppd.   Type II diabetes mellitus (HCC)    Unstable angina ([place]) 07/11/2016   Varicose vein     Patient Active Problem List   Diagnosis Date Noted   Acquired absence of right leg below knee ([place]) 08/22/2020   Pyogenic inflammation of bone ([place])    Chronic a-fib ([place])    Demand ischemia ([place])    Palliative care by specialist    CHF (congestive heart failure) ([place]) 06/18/2020   Chronic anticoagulation 06/17/2020   Chronic venous insufficiency 02/10/2020    Persistent atrial fibrillation ([place]) 01/19/2020   Chronic bilateral low back pain with sciatica 10/08/2019   Medication management contract signed 10/08/2019   Chronic hyponatremia 09/27/2019   CAD (coronary artery disease) 08/31/2019   Acute on chronic systolic CHF (congestive heart failure) ([name]) 11/05/2018   Open toe wound 10/01/2018   Venous ulcer of left leg ([place]) 08/19/2018   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[place]) 04/28/2018   Abnormal MRI, shoulder (Right) 02/23/2018   Abnormal MRI, cervical spine (2016) 02/23/2018   DDD (degenerative disc disease), cervical 02/23/2018   Cervical foraminal stenosis (Bilateral) 02/23/2018   Cervical central spinal stenosis 02/23/2018   Cervical facet hypertrophy 02/23/2018   Arthralgia of acromioclavicular joint (Right) 02/23/2018   Osteoarthritis of  AC (acromioclavicular) joint (Right) 02/23/2018   Biceps tendinosis of shoulder (Right) 02/23/2018   Tendinopathy of rotator cuff (Right) 02/23/2018   Vitamin D deficiency 02/23/2018   Atherosclerosis of native arteries of the extremities with ulceration ([place]) 02/15/2018   Peripheral edema 02/05/2018   Status post peripherally inserted central catheter (PICC) central line placement 02/04/2018   Chronic lower extremity pain (Primary Area of Pain) (Right) 01/29/2018   Chronic shoulder pain (Tertiary Area of Pain) (Right) 01/29/2018   Chronic pain syndrome 01/29/2018   Long term current use of opiate analgesic 01/29/2018   Medication monitoring encounter 01/29/2018   Disorder of skeletal system 01/29/2018   Problems influencing health status 01/29/2018   NSTEMI (non-ST elevated myocardial infarction) ([place]) 11/21/2017   HLD (hyperlipidemia) 09/10/2017   Angina pectoris ([place]) 09/08/2017   Lymphedema 05/02/2017   Chest pain 07/11/2016   Foot ulcer (Right)    Stable angina (HCC)    Pressure injury of skin 05/06/2016   Diabetic foot infection ([place]) 05/05/2016   Type 2 diabetes  mellitus with hyperlipidemia ([place]) 01/26/2016   Cellulitis 11/21/2015   Morbid obesity ([place]) 11/17/2014   PAD (peripheral artery disease) ([place])    Hypertension    Tobacco use    COPD (chronic obstructive pulmonary disease) ([place]) 05/19/2012   Diabetic neuropathy ([place]) 05/19/2012   PVD (peripheral vascular disease) ([place]) 03/27/2012    Past Surgical History:  Procedure Laterality Date   ABDOMINAL AORTAGRAM N/A 06/23/2013   Procedure: ABDOMINAL Maxcine Ham;  Surgeon: Wellington Hampshire, MD;  Location: [place] CATH LAB;  Service: Cardiovascular;  Laterality: N/A;   AMPUTATION Right 04/01/2018   Procedure: AMPUTATION BELOW KNEE;  Surgeon: Algernon Huxley, MD;  Location: [facility] ORS;  Service: Vascular;  Laterality: Right;   AMPUTATION TOE Left 08/11/2020   Procedure: AMPUTATION TOE-2nd Toe;  Surgeon: Samara Deist, DPM;  Location: [facility] ORS;  Service: Podiatry;  Laterality: Left;   APPENDECTOMY     CARDIAC CATHETERIZATION  10/14   [place]; X1 STENT PROXIMAL RCA   CARDIAC CATHETERIZATION  02/2010   [facility]  CARDIAC CATHETERIZATION  02/21/2014   [facility]   CLAVICLE SURGERY     HERNIA REPAIR     IRRIGATION AND DEBRIDEMENT FOOT Right 01/04/2018   Procedure: IRRIGATION AND DEBRIDEMENT FOOT;  Surgeon: Samara Deist, DPM;  Location: [facility] ORS;  Service: Podiatry;  Laterality: Right;   LEFT HEART CATH AND CORONARY ANGIOGRAPHY N/A 01/20/2020   Procedure: LEFT HEART CATH AND CORONARY ANGIOGRAPHY;  Surgeon: Wellington Hampshire, MD;  Location: [place] CV LAB;  Service: Cardiovascular;  Laterality: N/A;   LEFT HEART CATH AND CORS/GRAFTS ANGIOGRAPHY N/A 04/28/2017   Procedure: LEFT HEART CATH AND CORONARY ANGIOGRAPHY;  Surgeon: Wellington Hampshire, MD;  Location: [place] CV LAB;  Service: Cardiovascular;  Laterality: N/A;   LOWER EXTREMITY ANGIOGRAPHY Right 03/03/2018   Procedure: LOWER EXTREMITY ANGIOGRAPHY;  Surgeon: Katha Cabal, MD;  Location: [place] CV LAB;  Service: Cardiovascular;  Laterality: Right;    LOWER EXTREMITY ANGIOGRAPHY Left 08/24/2018   Procedure: LOWER EXTREMITY ANGIOGRAPHY;  Surgeon: Algernon Huxley, MD;  Location: [place] CV LAB;  Service: Cardiovascular;  Laterality: Left;   LOWER EXTREMITY ANGIOGRAPHY Left 06/14/2019   Procedure: LOWER EXTREMITY ANGIOGRAPHY;  Surgeon: Algernon Huxley, MD;  Location: [place] CV LAB;  Service: Cardiovascular;  Laterality: Left;   LOWER EXTREMITY ANGIOGRAPHY Left 08/02/2019   Procedure: LOWER EXTREMITY ANGIOGRAPHY;  Surgeon: Algernon Huxley, MD;  Location: [place] CV LAB;  Service: Cardiovascular;  Laterality: Left;   LOWER EXTREMITY ANGIOGRAPHY Left 07/17/2020   Procedure: LOWER EXTREMITY ANGIOGRAPHY;  Surgeon: Algernon Huxley, MD;  Location: [place] CV LAB;  Service: Cardiovascular;  Laterality: Left;   PERIPHERAL ARTERIAL STENT GRAFT     x2 left/right   PERIPHERAL VASCULAR BALLOON ANGIOPLASTY Right 01/06/2018   Procedure: PERIPHERAL VASCULAR BALLOON ANGIOPLASTY;  Surgeon: Katha Cabal, MD;  Location: [place] CV LAB;  Service: Cardiovascular;  Laterality: Right;   PERIPHERAL VASCULAR CATHETERIZATION N/A 07/06/2015   Procedure: Abdominal Aortogram w/Lower Extremity;  Surgeon: Algernon Huxley, MD;  Location: [place] CV LAB;  Service: Cardiovascular;  Laterality: N/A;   PERIPHERAL VASCULAR CATHETERIZATION  07/06/2015   Procedure: Lower Extremity Intervention;  Surgeon: Algernon Huxley, MD;  Location: [place] CV LAB;  Service: Cardiovascular;;    Prior to Admission medications   Medication Sig Start Date End Date Taking? Authorizing Provider  albuterol (VENTOLIN HFA) 108 (90 Base) MCG/ACT inhaler Inhale into the lungs every 6 (six) hours as needed for wheezing or shortness of breath.    [provider]  atorvastatin (LIPITOR) 80 MG tablet Take 1 tablet (80 mg total) by mouth daily. 10/09/20   Minna Merritts, MD  carvedilol (COREG) 3.125 MG tablet Take 1 tablet (3.125 mg total) by mouth 2 (two) times daily with a meal.  10/09/20   Gollan, Kathlene November, MD  clopidogrel (PLAVIX) 75 MG tablet Take 1 tablet (75 mg total) by mouth daily. 10/09/20   Minna Merritts, MD  digoxin (LANOXIN) 0.125 MG tablet Take 1 tablet (0.125 mg total) by mouth daily. 10/09/20   Minna Merritts, MD  DULoxetine (CYMBALTA) 30 MG capsule Take 30 mg by mouth daily.     [provider]  ELIQUIS 5 MG TABS tablet Take 5 mg by mouth 2 (two) times daily. 02/02/20   [provider]  empagliflozin (JARDIANCE) 10 MG TABS tablet Take 10 mg by mouth daily.  02/03/20   [provider]  ezetimibe (ZETIA) 10 MG tablet Take 1 tablet (10 mg total)  by mouth daily. 10/09/20   Minna Merritts, MD  gabapentin (NEURONTIN) 800 MG tablet Take 800 mg by mouth 4 (four) times daily.  08/14/17 04/04/29  [provider]  ipratropium-albuterol (DUONEB) 0.5-2.5 (3) MG/3ML SOLN Inhale 3 mLs into the lungs every 6 (six) hours as needed (wheezing/sob).     [provider]  isosorbide mononitrate (IMDUR) 30 MG 24 hr tablet Take 1 tablet (30 mg total) by mouth daily. 10/09/20   Minna Merritts, MD  losartan (COZAAR) 25 MG tablet Take 0.5 tablets (12.5 mg total) by mouth daily. 10/09/20   Minna Merritts, MD  metFORMIN (GLUCOPHAGE-XR) 500 MG 24 hr tablet Take 1,000 mg by mouth 2 (two) times daily. 01/02/21   [provider]  Multiple Vitamin (MULTI-VITAMINS) TABS Take 1 tablet by mouth daily.     [provider]  nitroGLYCERIN (NITROSTAT) 0.4 MG SL tablet Place 1 tablet (0.4 mg total) under the tongue every 5 (five) minutes as needed for chest pain. 10/09/20   Minna Merritts, MD  omeprazole (PRILOSEC) 40 MG capsule Take 40 mg by mouth daily.    [provider]  Oxycodone HCl 10 MG TABS Take 10 mg by mouth every 6 (six) hours as needed (pain).  01/15/18   [provider]  polyethylene glycol (MIRALAX / GLYCOLAX) 17 g packet Take 17 g by mouth daily as needed for mild constipation. 08/12/20   Loletha Grayer, MD   potassium chloride (KLOR-CON) 10 MEQ tablet Take 1 tablet (10 mEq total) by mouth 2 (two) times daily. 10/09/20   Minna Merritts, MD  spironolactone (ALDACTONE) 25 MG tablet Take 0.5 tablets (12.5 mg total) by mouth daily. 10/09/20   Minna Merritts, MD  sulfamethoxazole-trimethoprim (BACTRIM DS) 800-160 MG tablet Take 1 tablet by mouth 2 (two) times daily. Patient not taking: Reported on 01/18/2021 12/19/20   Kris Hartmann, NP  SYMBICORT 80-4.5 MCG/ACT inhaler Inhale 1 puff into the lungs 2 (two) times daily. 01/01/20   [provider]  tamsulosin (FLOMAX) 0.4 MG CAPS capsule Take 1 capsule (0.4 mg total) by mouth daily after supper. 07/01/20   Shawna Clamp, MD  torsemide (DEMADEX) 20 MG tablet Take 2 tablets (40 mg total) by mouth daily. Patient taking differently: Take 40 mg by mouth 2 (two) times daily. Taking 2 in the AM and 1 at lunch 10/09/20   Minna Merritts, MD  traZODone (DESYREL) 150 MG tablet Take 150 mg by mouth at bedtime. 07/12/19   [provider]  VERQUVO 10 MG TABS TAKE 1 TABLET BY MOUTH ONCE DAILY 11/16/20   Darylene Price A, FNP    Allergies Dulaglutide, Ace inhibitors, Other, and Prednisone  Family History  Problem Relation Age of Onset   Heart attack Father    Heart disease Father    Hypertension Father    Hyperlipidemia Father    Hypertension Mother    Hyperlipidemia Mother     Social History Social History   Tobacco Use   Smoking status: Every Day    Packs/day: 1.00    Years: 41.00    Pack years: 41.00    Types: Cigarettes   Smokeless tobacco: Never  Vaping Use   Vaping Use: Never used  Substance Use Topics   Alcohol use: No   Drug use: No    Review of Systems  Constitutional: No fever/chills Eyes: No visual changes. ENT: No sore throat. Cardiovascular: Denies chest pain. Respiratory: Reports shortness of breath.  Endorses productive cough  and congestion. Gastrointestinal: No abdominal pain.  No nausea, no vomiting.  No  diarrhea.  No constipation. Genitourinary: Negative for dysuria. Musculoskeletal: Negative for back pain. Skin: Negative for rash. Neurological: Negative for headaches, focal weakness or numbness. ____________________________________________   PHYSICAL EXAM:  VITAL SIGNS: ED Triage Vitals  Enc Vitals Group     BP 01/19/21 2017 118/82     Pulse Rate 01/19/21 2017 96     Resp 01/19/21 2017 16     Temp 01/19/21 2017 97.8 F (36.6 C)     Temp Source 01/19/21 2017 Oral     SpO2 01/19/21 2017 99 %     Weight 01/19/21 2015 191 lb 12.8 oz (87 kg)     Height 01/19/21 2015 '5\' 7"'$  (1.702 m)     Head Circumference --      Peak Flow --      Pain Score 01/19/21 2014 5     Pain Loc --      Pain Edu? --      Excl. in [place]? --     Constitutional: Alert and oriented. Well appearing and in no acute distress. Eyes: Conjunctivae are normal. PERRL. EOMI. Head: Atraumatic. Mouth/Throat: Mucous membranes are moist.  Oropharynx non-erythematous. Neck: No stridor.   Cardiovascular: Normal rate, regular rhythm. Grossly normal heart sounds.  Good peripheral circulation. Respiratory: Normal respiratory effort.  No retractions. Lungs CTAB. Gastrointestinal: Soft and nontender. No distention. No abdominal bruits. No CVA tenderness. Musculoskeletal: No left lower extremity tenderness nor edema.  No joint effusions. Neurologic:  Normal speech and language. No gross focal neurologic deficits are appreciated. No gait instability. Skin:  Skin is warm, dry and intact. No rash noted. Psychiatric: Mood and affect are normal. Speech and behavior are normal.  ____________________________________________   LABS (all labs ordered are listed, but only abnormal results are displayed)  Labs Reviewed  BASIC METABOLIC PANEL - Abnormal; Notable for the following components:      Result Value   Sodium 134 (*)    Chloride 96 (*)    Glucose, Bld 110 (*)    BUN 23 (*)    Creatinine, Ser 1.38 (*)    GFR, Estimated 59  (*)    All other components within normal limits  CBC WITH DIFFERENTIAL/PLATELET - Abnormal; Notable for the following components:   Hemoglobin 11.4 (*)    HCT 34.6 (*)    MCV 78.8 (*)    RDW 25.1 (*)    All other components within normal limits  BRAIN NATRIURETIC PEPTIDE - Abnormal; Notable for the following components:   B Natriuretic Peptide 1,964.1 (*)    All other components within normal limits  TROPONIN I (HIGH SENSITIVITY) - Abnormal; Notable for the following components:   Troponin I (High Sensitivity) 299 (*)    All other components within normal limits  RESP PANEL BY RT-PCR (FLU A&B, COVID) ARPGX2  TROPONIN I (HIGH SENSITIVITY)   ____________________________________________  EKG  Left axis deviation RBBB Atrial fib Vent. rate 69 BPM No STEMI PR interval * ms QRS duration 158 ms QT/QTcB 408/437 ms P-R-T axes * -75 99 ____________________________________________  RADIOLOGY I, Melvenia Needles, personally viewed and evaluated these images (plain radiographs) as part of my medical decision making, as well as reviewing the written report by the radiologist.  ED MD interpretation:  agree with report  Official radiology report(s): DG Chest 2 View  Result Date: 01/19/2021 CLINICAL DATA:  Cough and congestion for a few days EXAM: CHEST - 2 VIEW  COMPARISON:  08/22/2020 FINDINGS: Cardiac shadow is enlarged but stable. Aortic calcifications are noted. The lungs are well aerated bilaterally. No focal infiltrate or effusion is seen. No bony abnormality is noted. IMPRESSION: No acute abnormality noted. Aortic Atherosclerosis (ICD10-I70.0). Electronically Signed   By: Inez Catalina M.D.   On: 01/19/2021 20:56    ____________________________________________   PROCEDURES  Procedure(s) performed (including Critical Care):  Procedures  Lasix 60 mg IVP ____________________________________________   INITIAL IMPRESSION / ASSESSMENT AND PLAN / ED COURSE  As part of my  medical decision making, I reviewed the following data within the [place] reviewed elevated BNP and critical trop at 299, EKG interpreted atrial fibrillation and RBBB, Old EKG reviewed, Radiograph reviewed NAD, Evaluated by EM attending Elsie Lincoln, MD, and Notes from prior ED visits   Differential includes, but is not limited to, viral syndrome, bronchitis including COPD exacerbation, pneumonia, reactive airway disease including asthma, CHF including exacerbation with or without pulmonary/interstitial edema, pneumothorax, ACS, thoracic trauma, and pulmonary embolism.  Patient with above medical history including CHF with decreased EF, peripheral vascular disease, COPD, presents for evaluation of shortness of breath.  Patient was evaluated for his complaints in the ED, and had an overall reassuring exam.  No signs of excessive fluid overload, and chest x-ray did not reveal any acute pulmonary process.  He did have an elevated BNP as noted, and a critically high troponin at 299.  Patient does have chronically elevated troponins based on chart review.  Patient has been on 2 L of O2 by nasal cannula while in the ED, with normal sats.  While at rest without O2, patient had sats ranging in the high 90s, on ambulation without O2, he had sats around 86%.  Patient is diuresing after the IV Lasix bolus.  He endorses improvement of his symptoms, and is verbalizing his intent and desire to go home.  Second troponin is pending at this time, patient care will be transferred to my attending, Dr. Alfred Levins at this time, final disposition will be determined based on his repeat troponin.  Clinical Course as of 01/20/21 0013  Fri Jan 19, 2021  2245 B Natriuretic Peptide(!): [id number] [JM]  Sat Jan 20, 2021  0004 Troponin I (High Sensitivity) [JM]    Clinical Course User Index [JM] Passion Lavin, Dannielle Karvonen, PA-C     ____________________________________________   FINAL CLINICAL IMPRESSION(S) /  ED DIAGNOSES  Final diagnoses:  SOB (shortness of breath) on exertion  Chronic systolic (congestive) heart failure [facility])     ED Discharge Orders     None        Note:  This document was prepared using Dragon voice recognition software and may include unintentional dictation errors.    Melvenia Needles, PA-C 01/20/21 [place], [place], MD 01/20/21 [phone]

## 2021-01-20 ENCOUNTER — Encounter (INDEPENDENT_AMBULATORY_CARE_PROVIDER_SITE_OTHER): Payer: Self-pay | Admitting: Nurse Practitioner

## 2021-01-20 LAB — TROPONIN I (HIGH SENSITIVITY): Troponin I (High Sensitivity): 341 ng/L (ref <18)

## 2021-01-20 NOTE — Discharge Instructions (Addendum)
As explained to you, you have too much fluid on board to the point that your heart is under distress and your oxygen is very low.  This combination can lead to an abnormal heart rhythm that can cause you to die.  Also the excessive fluid can cause difficulty breathing, worsening low oxygen, and death.  If you sustain any cardiac arrest you may be in a vegetative/coma state if you are able to be resuscitated.  All of this risks can be minimized by staying in the hospital while we remove fluid and provide you with oxygen.  If you change your mind and wish to continue treatment please return at any time.  Otherwise try to follow-up with the congestive heart failure clinic tomorrow for close monitoring.

## 2021-01-20 NOTE — ED Notes (Signed)
Second troponin sent to lab. Patient updated on POC. Patient reports he does not want to be admitted to the hospital, and would like to go home. Provider aware.

## 2021-01-20 NOTE — Progress Notes (Signed)
Subjective:    Patient ID: [name] [name], male    DOB: 11-14-1960, 61 y.o.   MRN: [id number] Chief Complaint  Patient presents with   Follow-up    Lt unna boot check    [name] [name] is a 60 year old male that presents today for evaluation of lower extremity edema after several weeks of Unna wraps.  Today the patient has no further wounds or ulcerations.  The lower extremity edema is completely under control.   Review of Systems  Cardiovascular:  Negative for leg swelling.  Skin:  Negative for wound.  All other systems reviewed and are negative.     Objective:   Physical Exam Vitals reviewed.  HENT:     Head: Normocephalic.  Cardiovascular:     Rate and Rhythm: Normal rate.  Pulmonary:     Effort: Pulmonary effort is normal.  Skin:    General: Skin is warm and dry.  Neurological:     Mental Status: He is alert and oriented to person, place, and time.  Psychiatric:        Mood and Affect: Mood normal.        Behavior: Behavior normal.        Thought Content: Thought content normal.        Judgment: Judgment normal.    BP (!) 85/50   Pulse (!) 58   Ht '5\' 7"'$  (1.702 m)   Wt 185 lb (83.9 kg)   BMI 28.98 kg/m   Past Medical History:  Diagnosis Date   Acute on chronic heart failure (HCC)    AKI (acute kidney injury) ([place]) 01/03/2018   Angina at rest [facility]) 08/13/2017   Arthritis    Asthma    Atherosclerosis    BPH (benign prostatic hyperplasia)    Carotid arterial disease ([place])    Cellulitis of foot, left 09/27/2019   Charcot's joint of foot, right    Chronic combined systolic (congestive) and diastolic (congestive) heart failure ([place])    a. 02/2013 EF 50% by LV gram; b. 04/2017 Echo: EF 25-30%. diff HK. Gr2 DD. Mod dil LA/RV. PASP 28mHg.   Chronic foot pain (Secondary Area of Pain) (Right) 01/29/2018   COPD (chronic obstructive pulmonary disease) (HCC)    COPD with acute exacerbation ([place] 01/30/2020   Coronary artery disease    a. 2013 S/P PCI of LAD ([facility];  b.  03/2013 PCI: RCA 90p ( 2.5 x 23 mm DES); c. 02/2014 Cath: patent RCA stent->Med Rx; d. 04/2017 Cath: LM nl, LAd 30ost/p, 675m, D1 80ost, LCX 90p/m, OM1 85, RCA 70/20p, 8080m0d->Referred for CT Surg-felt to be poor candidate.   Coronary artery disease of native artery of native heart with stable angina pectoris ([place]/08/2017   [facility] Nuclear stress test showed inferior wall defect. Cardiac catheterization showed patent LAD stent with 90% stenosis in the proximal RCA. Ejection fraction was 50%. Status post PCI of the RCA with a 2.5 x 23 mm drug-eluting stent   Diabetic neuropathy ([place]  Diabetic ulcer of right foot ([place]  [place]    HFrEF (heart failure with reduced ejection fraction) ([facility]CC[place]/22/2021   Hyperlipidemia    Hypertension    Ischemic cardiomyopathy    a. 04/2017 Echo: EF 25-30%; b. 08/2017 Cardiac MRI ([facility]): EF 19%, sev glob HK. RVEF 20%, mod BAE, triv MR, mild-mod TR. Basal lateral subendocardial infarct (viable), inf/infsept, dist  septal ischemia, basal to mid lat peri-infarct ischemia.   Morbid obesity ([place])    Neuropathy    PAD (peripheral artery disease) ([place])    a. Followed by Dr. Lucky Cowboy; b. 08/2010 Periph Angio: RSFA 70-80p (6X60 self-expanding stent); c. 09/2010 Periph Angio: L SFA 100p (7x unknown length self-expanding stent); d. 06/2015 Periph Angio: R SFA short segment occlusion (6x12 self-expanding stent).   Restless leg syndrome    Sepsis ([place]) 03/30/2018   Sleep apnea    Subclavian artery stenosis, right (HCC)    Syncope and collapse    Tobacco use    a. 75+ yr hx - still smoking 1ppd, down from 2 ppd.   Type II diabetes mellitus (HCC)    Unstable angina ([place]) 07/11/2016   Varicose vein     Social History   Socioeconomic History   Marital status: Single    Spouse name: Not on file   Number of children: 0   Years of education: 12   Highest education level: 12th grade   Occupational History   Occupation: on disability  Tobacco Use   Smoking status: Every Day    Packs/day: 1.00    Years: 41.00    Pack years: 41.00    Types: Cigarettes   Smokeless tobacco: Never  Vaping Use   Vaping Use: Never used  Substance and Sexual Activity   Alcohol use: No   Drug use: No   Sexual activity: Yes  Other Topics Concern   Not on file  Social History Narrative   Lives at home in [place] with girlfriend. Independent at baseline.   Social Determinants of Health   Financial Resource Strain: Not on file  Food Insecurity: Not on file  Transportation Needs: Not on file  Physical Activity: Not on file  Stress: Not on file  Social Connections: Not on file  Intimate Partner Violence: Not on file    Past Surgical History:  Procedure Laterality Date   ABDOMINAL AORTAGRAM N/A 06/23/2013   Procedure: ABDOMINAL Maxcine Ham;  Surgeon: Wellington Hampshire, MD;  Location: [place] CATH LAB;  Service: Cardiovascular;  Laterality: N/A;   AMPUTATION Right 04/01/2018   Procedure: AMPUTATION BELOW KNEE;  Surgeon: Algernon Huxley, MD;  Location: [facility] ORS;  Service: Vascular;  Laterality: Right;   AMPUTATION TOE Left 08/11/2020   Procedure: AMPUTATION TOE-2nd Toe;  Surgeon: Samara Deist, DPM;  Location: [facility] ORS;  Service: Podiatry;  Laterality: Left;   APPENDECTOMY     CARDIAC CATHETERIZATION  10/14   [place]; X1 STENT PROXIMAL RCA   CARDIAC CATHETERIZATION  02/2010   [facility]   CARDIAC CAT[facility]IZATION  02/21/2014   [facility]   CLAVICLE SURGERY     HERNIA REPAIR     IRRIGATION AND DEBRIDEMENT FOOT Right 01/04/2018   Procedure: IRRIGATION AND DEBRIDEMENT FOOT;  Surgeon: Samara Deist, DPM;  Location: [facility] ORS;  Service: Podiatry;  Laterality: Right;   LEFT HEART CATH AND CORONARY ANGIOGRAPHY N/A 01/20/2020   Procedure: LEFT HEART CATH AND CORONARY ANGIOGRAPHY;  Surgeon: Wellington Hampshire, MD;  Location: [place] CV LAB;  Service: Cardiovascular;  Laterality: N/A;   LEFT HEART CATH AND  CORS/GRAFTS ANGIOGRAPHY N/A 04/28/2017   Procedure: LEFT HEART CATH AND CORONARY ANGIOGRAPHY;  Surgeon: Wellington Hampshire, MD;  Location: [place] CV LAB;  Service: Cardiovascular;  Laterality: N/A;   LOWER EXTREMITY ANGIOGRAPHY Right 03/03/2018   Procedure: LOWER EXTREMITY ANGIOGRAPHY;  Surgeon: Katha Cabal, MD;  Location: [place] CV LAB;  Service: Cardiovascular;  Laterality: Right;  LOWER EXTREMITY ANGIOGRAPHY Left 08/24/2018   Procedure: LOWER EXTREMITY ANGIOGRAPHY;  Surgeon: Algernon Huxley, MD;  Location: [place] CV LAB;  Service: Cardiovascular;  Laterality: Left;   LOWER EXTREMITY ANGIOGRAPHY Left 06/14/2019   Procedure: LOWER EXTREMITY ANGIOGRAPHY;  Surgeon: Algernon Huxley, MD;  Location: [place] CV LAB;  Service: Cardiovascular;  Laterality: Left;   LOWER EXTREMITY ANGIOGRAPHY Left 08/02/2019   Procedure: LOWER EXTREMITY ANGIOGRAPHY;  Surgeon: Algernon Huxley, MD;  Location: [place] CV LAB;  Service: Cardiovascular;  Laterality: Left;   LOWER EXTREMITY ANGIOGRAPHY Left 07/17/2020   Procedure: LOWER EXTREMITY ANGIOGRAPHY;  Surgeon: Algernon Huxley, MD;  Location: [place] CV LAB;  Service: Cardiovascular;  Laterality: Left;   PERIPHERAL ARTERIAL STENT GRAFT     x2 left/right   PERIPHERAL VASCULAR BALLOON ANGIOPLASTY Right 01/06/2018   Procedure: PERIPHERAL VASCULAR BALLOON ANGIOPLASTY;  Surgeon: Katha Cabal, MD;  Location: [place] CV LAB;  Service: Cardiovascular;  Laterality: Right;   PERIPHERAL VASCULAR CATHETERIZATION N/A 07/06/2015   Procedure: Abdominal Aortogram w/Lower Extremity;  Surgeon: Algernon Huxley, MD;  Location: [place] CV LAB;  Service: Cardiovascular;  Laterality: N/A;   PERIPHERAL VASCULAR CATHETERIZATION  07/06/2015   Procedure: Lower Extremity Intervention;  Surgeon: Algernon Huxley, MD;  Location: [place] CV LAB;  Service: Cardiovascular;;    Family History  Problem Relation Age of Onset   Heart attack Father    Heart  disease Father    Hypertension Father    Hyperlipidemia Father    Hypertension Mother    Hyperlipidemia Mother     Allergies  Allergen Reactions   Dulaglutide Anaphylaxis, Diarrhea and Hives   Ace Inhibitors     hypotension   Other Itching    Skin itching associated with nitro patch   Prednisone Rash    CBC Latest Ref Rng & Units 01/19/2021 08/22/2020 08/12/2020  WBC 4.0 - 10.5 K/uL 4.4 7.9 8.2  Hemoglobin 13.0 - 17.0 g/dL 11.4(L) 12.6(L) 12.5(L)  Hematocrit 39.0 - 52.0 % 34.6(L) 38.5(L) 36.9(L)  Platelets 150 - 400 K/uL 151 220 132(L)      CMP     Component Value Date/Time   NA 134 (L) 01/19/2021 2108   NA 135 07/07/2020 1211   NA 136 06/08/2014 1037   K 3.8 01/19/2021 2108   K 4.3 06/08/2014 1037   CL 96 (L) 01/19/2021 2108   CL 101 06/08/2014 1037   CO2 30 01/19/2021 2108   CO2 27 06/08/2014 1037   GLUCOSE 110 (H) 01/19/2021 2108   GLUCOSE 415 (H) 06/08/2014 1037   BUN 23 (H) 01/19/2021 2108   BUN 42 (H) 07/07/2020 1211   BUN 9 06/08/2014 1037   CREATININE 1.38 (H) 01/19/2021 2108   CREATININE 0.87 02/05/2018 1417   CALCIUM 9.3 01/19/2021 2108   CALCIUM 9.2 06/08/2014 1037   PROT 7.4 08/22/2020 1602   PROT 6.1 01/29/2018 1602   PROT 7.3 03/07/2014 1955   ALBUMIN 4.2 08/22/2020 1602   ALBUMIN 3.3 (L) 01/29/2018 1602   ALBUMIN 3.9 03/07/2014 1955   AST 26 08/22/2020 1602   AST 17 03/07/2014 1955   ALT 13 08/22/2020 1602   ALT 23 03/07/2014 1955   ALKPHOS 80 08/22/2020 1602   ALKPHOS 105 03/07/2014 1955   BILITOT 1.7 (H) 08/22/2020 1602   BILITOT 0.6 01/29/2018 1602   BILITOT 0.6 03/07/2014 1955   GFRNONAA 59 (L) 01/19/2021 2108   GFRNONAA >60 06/08/2014 1037   GFRNONAA >60 02/17/2014 [id number]  GFRAA 52 (L) 07/07/2020 1211   GFRAA >60 06/08/2014 1037   GFRAA >60 02/17/2014 0909     No results found.     Assessment & Plan:   1. Venous ulcer of left leg ([place]) Patient's venous ulcerations have resolved at this time.  The patient is advised to  continue with conservative therapy of using medical grade compression, elevation and activity when possible to help with the edema.  He is advised to contact the office at that swelling or ulcerations recur.  2. Atherosclerosis of native arteries of left leg with ulceration of other part of foot ([place])  Recommend:  The patient has evidence of atherosclerosis of the lower extremities with claudication.  The patient does not voice lifestyle limiting changes at this point in time.  Noninvasive studies do not suggest clinically significant change.  No invasive studies, angiography or surgery at this time The patient should continue walking and begin a more formal exercise program.  The patient should continue antiplatelet therapy and aggressive treatment of the lipid abnormalities  No changes in the patient's medications at this time    Current Outpatient Medications on File Prior to Visit  Medication Sig Dispense Refill   albuterol (VENTOLIN HFA) 108 (90 Base) MCG/ACT inhaler Inhale into the lungs every 6 (six) hours as needed for wheezing or shortness of breath.     atorvastatin (LIPITOR) 80 MG tablet Take 1 tablet (80 mg total) by mouth daily. 90 tablet 3   carvedilol (COREG) 3.125 MG tablet Take 1 tablet (3.125 mg total) by mouth 2 (two) times daily with a meal. 90 tablet 3   clopidogrel (PLAVIX) 75 MG tablet Take 1 tablet (75 mg total) by mouth daily. 90 tablet 3   digoxin (LANOXIN) 0.125 MG tablet Take 1 tablet (0.125 mg total) by mouth daily. 90 tablet 3   DULoxetine (CYMBALTA) 30 MG capsule Take 30 mg by mouth daily.      ELIQUIS 5 MG TABS tablet Take 5 mg by mouth 2 (two) times daily.     empagliflozin (JARDIANCE) 10 MG TABS tablet Take 10 mg by mouth daily.      ezetimibe (ZETIA) 10 MG tablet Take 1 tablet (10 mg total) by mouth daily. 90 tablet 3   gabapentin (NEURONTIN) 800 MG tablet Take 800 mg by mouth 4 (four) times daily.      ipratropium-albuterol (DUONEB) 0.5-2.5 (3) MG/3ML  SOLN Inhale 3 mLs into the lungs every 6 (six) hours as needed (wheezing/sob).      isosorbide mononitrate (IMDUR) 30 MG 24 hr tablet Take 1 tablet (30 mg total) by mouth daily. 90 tablet 3   losartan (COZAAR) 25 MG tablet Take 0.5 tablets (12.5 mg total) by mouth daily. 46 tablet 3   metFORMIN (GLUCOPHAGE-XR) 500 MG 24 hr tablet Take 1,000 mg by mouth 2 (two) times daily.     Multiple Vitamin (MULTI-VITAMINS) TABS Take 1 tablet by mouth daily.      nitroGLYCERIN (NITROSTAT) 0.4 MG SL tablet Place 1 tablet (0.4 mg total) under the tongue every 5 (five) minutes as needed for chest pain. 30 tablet 5   omeprazole (PRILOSEC) 40 MG capsule Take 40 mg by mouth daily.     Oxycodone HCl 10 MG TABS Take 10 mg by mouth every 6 (six) hours as needed (pain).      polyethylene glycol (MIRALAX / GLYCOLAX) 17 g packet Take 17 g by mouth daily as needed for mild constipation. 14 each 0   potassium chloride (KLOR-CON)  10 MEQ tablet Take 1 tablet (10 mEq total) by mouth 2 (two) times daily. 180 tablet 3   spironolactone (ALDACTONE) 25 MG tablet Take 0.5 tablets (12.5 mg total) by mouth daily. 90 tablet 3   sulfamethoxazole-trimethoprim (BACTRIM DS) 800-160 MG tablet Take 1 tablet by mouth 2 (two) times daily. (Patient not taking: No sig reported) 14 tablet 0   SYMBICORT 80-4.5 MCG/ACT inhaler Inhale 1 puff into the lungs 2 (two) times daily.     tamsulosin (FLOMAX) 0.4 MG CAPS capsule Take 1 capsule (0.4 mg total) by mouth daily after supper. 30 capsule 1   torsemide (DEMADEX) 20 MG tablet Take 2 tablets (40 mg total) by mouth daily. (Patient taking differently: Take 40 mg by mouth 2 (two) times daily. Taking 2 in the AM and 1 at lunch) 180 tablet 3   traZODone (DESYREL) 150 MG tablet Take 150 mg by mouth at bedtime.     VERQUVO 10 MG TABS TAKE 1 TABLET BY MOUTH ONCE DAILY 30 tablet 5   No current facility-administered medications on file prior to visit.    There are no Patient Instructions on file for this  visit. No follow-ups on file.   Kris Hartmann, NP

## 2021-01-20 NOTE — ED Notes (Signed)
Admission discussed with patient. Patient adamant that he is going home. Jenice, PA at bedside.

## 2021-01-20 NOTE — ED Notes (Signed)
Patient placed on portable monitor in [facility]. Patient provided warm blanket. Oxygen at 2L via [place] at this time.

## 2021-01-20 NOTE — ED Notes (Signed)
ED Provider at bedside. 

## 2021-01-20 NOTE — ED Notes (Signed)
Walking sat performed with this RN and [place], [place]. Patient ambulatory to restroom about 25 yards. Patient sat while ambulatory down to 84% on room air. Sat while urinating down to 80%. Patient ambulatory back to room with steady gait, oxygen re-applied.

## 2021-01-23 ENCOUNTER — Encounter: Payer: Self-pay | Admitting: Family

## 2021-01-23 ENCOUNTER — Other Ambulatory Visit: Payer: Self-pay

## 2021-01-23 ENCOUNTER — Ambulatory Visit: Payer: Medicare Other | Attending: Family | Admitting: Family

## 2021-01-23 VITALS — BP 108/74 | HR 45 | Resp 16 | Ht 67.0 in | Wt 197.0 lb

## 2021-01-23 DIAGNOSIS — I5033 Acute on chronic diastolic (congestive) heart failure: Secondary | ICD-10-CM | POA: Diagnosis not present

## 2021-01-23 DIAGNOSIS — G4733 Obstructive sleep apnea (adult) (pediatric): Secondary | ICD-10-CM | POA: Diagnosis not present

## 2021-01-23 DIAGNOSIS — Z955 Presence of coronary angioplasty implant and graft: Secondary | ICD-10-CM | POA: Diagnosis not present

## 2021-01-23 DIAGNOSIS — R058 Other specified cough: Secondary | ICD-10-CM | POA: Diagnosis not present

## 2021-01-23 DIAGNOSIS — R001 Bradycardia, unspecified: Secondary | ICD-10-CM | POA: Diagnosis not present

## 2021-01-23 DIAGNOSIS — Z8249 Family history of ischemic heart disease and other diseases of the circulatory system: Secondary | ICD-10-CM | POA: Insufficient documentation

## 2021-01-23 DIAGNOSIS — R42 Dizziness and giddiness: Secondary | ICD-10-CM | POA: Diagnosis not present

## 2021-01-23 DIAGNOSIS — Z720 Tobacco use: Secondary | ICD-10-CM

## 2021-01-23 DIAGNOSIS — F17210 Nicotine dependence, cigarettes, uncomplicated: Secondary | ICD-10-CM | POA: Insufficient documentation

## 2021-01-23 DIAGNOSIS — I70219 Atherosclerosis of native arteries of extremities with intermittent claudication, unspecified extremity: Secondary | ICD-10-CM

## 2021-01-23 DIAGNOSIS — Z888 Allergy status to other drugs, medicaments and biological substances status: Secondary | ICD-10-CM | POA: Insufficient documentation

## 2021-01-23 DIAGNOSIS — E1151 Type 2 diabetes mellitus with diabetic peripheral angiopathy without gangrene: Secondary | ICD-10-CM | POA: Diagnosis not present

## 2021-01-23 DIAGNOSIS — E1159 Type 2 diabetes mellitus with other circulatory complications: Secondary | ICD-10-CM

## 2021-01-23 DIAGNOSIS — Z7984 Long term (current) use of oral hypoglycemic drugs: Secondary | ICD-10-CM | POA: Diagnosis not present

## 2021-01-23 DIAGNOSIS — I110 Hypertensive heart disease with heart failure: Secondary | ICD-10-CM | POA: Diagnosis present

## 2021-01-23 DIAGNOSIS — R600 Localized edema: Secondary | ICD-10-CM | POA: Insufficient documentation

## 2021-01-23 DIAGNOSIS — I5023 Acute on chronic systolic (congestive) heart failure: Secondary | ICD-10-CM

## 2021-01-23 DIAGNOSIS — R0789 Other chest pain: Secondary | ICD-10-CM | POA: Insufficient documentation

## 2021-01-23 DIAGNOSIS — I7090 Unspecified atherosclerosis: Secondary | ICD-10-CM | POA: Diagnosis not present

## 2021-01-23 DIAGNOSIS — F32A Depression, unspecified: Secondary | ICD-10-CM | POA: Diagnosis not present

## 2021-01-23 DIAGNOSIS — I10 Essential (primary) hypertension: Secondary | ICD-10-CM

## 2021-01-23 MED ORDER — METOLAZONE 2.5 MG PO TABS: 2.5000 mg | ORAL_TABLET | Freq: Every day | ORAL | 0 refills | Status: DC | PRN

## 2021-01-23 NOTE — Progress Notes (Signed)
Patient ID: [name] [name], male    DOB: 1961-05-31, 60 y.o.   MRN: [id number]  HPI  [name] [name] is a 60 y/o male with a history of obstructive sleep apnea, PVD, PAD, HTN, hyperlipidemia, DM, CAD, COPD, BPH, asthma, current tobacco use and chronic heart failure.   Echo report from 06/17/20 reviewed and showed an EF of <20% along with elevated PA pressure of 53.2 mmHg and mild/ moderate TR. Echo report from 01/20/20 reviewed and showed an EF of <20% along with mild [name] and moderate TR. Echo report from 09/29/19 reviewed and showed an EF of 20-25% along with moderately elevated PA pressure, mild [name] and mild/moderate TR. Echo report from 11/06/2018 reviewed and showed an EF of 20-25% along with moderate TR and a PA pressure of 59.6 mmHg. Echo report from 04/25/17 reviewed and shows an EF of 25-30% along with a PA pressure of 34 mm Hg. EF has declined from 55-60% back in 2016.   LHC done 01/20/20 showed: Prox RCA-1 lesion is 80% stenosed. Prox RCA-2 lesion is 20% stenosed. Prox RCA to Mid RCA lesion is 80% stenosed. Prox LAD lesion is 20% stenosed. 1st Diag lesion is 80% stenosed. Mid LAD lesion is 20% stenosed. Dist LAD lesion is 60% stenosed. Prox Cx to Mid Cx lesion is 90% stenosed. LPAV lesion is 95% stenosed.   1. Significant underlying three-vessel coronary artery disease. Patent proximal LAD stent with mild in-stent restenosis. Moderate diffuse disease in the distal LAD. Significant stenosis in the proximal left circumflex at the origin of the posterior AV groove artery which is also heavily diseased at the ostium. This is a bifurcation lesion and heavily calcified. RCA stent is patent. However, there is significant proximal disease in the whole mid to distal segment is diffusely diseased and small caliber. 2. Left ventricular angiography was not performed. EF was severe reduced by echo. 3. Moderately elevated left ventricular end-diastolic pressure at 28 mmHg  Cardiac catheterization done 04/28/17  showed significant three-vessel disease with a patient stent in the RCA and LAD. Significant proximal RCS disease and the stent as well as diffuse mid and distal disease. LAD has moderate disease. Severely elevated left ventricular end-diastolic pressure at 34 mmHg. Possible CABG in the future with optimizing medical management. Stress done in 2015.  Was in the ED  01/19/21 due to fall out of bed after feeling light-headed along with feeling short of breath. Initially given IV lasix. Critically high troponin of 299. Was hypoxic on room air into the low-mid 80's. Admission recommended but patient declined. Was in the ED 08/22/20 due to upper back and shoulder pain. Hypotensive so IVF given along with antibiotics. Symptoms improved and he was released. Admitted 08/05/20 due to Osteomyelitis of the left second toe with diabetic foot infection and cellulitis. Podiatry and cardiology consults obtained. Amputation of the left second toe performed on 08/11/2020. Given IV antibiotics and then transitioned to oral medications. Given IV lasix due to HF exacerbation and then switched to oral diuretics.   Discharged after 7 days.   He presents today for a follow-up visit with a chief complaint of moderate fatigue with minimal exertion. He describes this as chronic in nature having been present for several years although has worsened over the last few days. He has associated productive cough, shortness of breath, intermittent chest pain, left leg edema, light-headedness, depression and difficulty sleeping along with this. He denies any palpitations or abdominal distention.   Says that he hasn't been weighing himself daily  so isn't sure if he's gained weight or not. No upcoming appointment currently scheduled with cardiology.   Past Medical History:  Diagnosis Date   Acute on chronic heart failure ([place])    AKI (acute kidney injury) ([place]) 01/03/2018   Angina at rest [facility]) 08/13/2017   Arthritis    Asthma    Atherosclerosis     BPH (benign prostatic hyperplasia)    Carotid arterial disease ([place])    Cellulitis of foot, left 09/27/2019   Charcot's joint of foot, right    Chronic combined systolic (congestive) and diastolic (congestive) heart failure ([place])    a. 02/2013 EF 50% by LV gram; b. 04/2017 Echo: EF 25-30%. diff HK. Gr2 DD. Mod dil LA/RV. PASP 45mHg.   Chronic foot pain (Secondary Area of Pain) (Right) 01/29/2018   COPD (chronic obstructive pulmonary disease) (HCC)    COPD with acute exacerbation ([place] 01/30/2020   Coronary artery disease    a. 2013 S/P PCI of LAD ([facility];  b. 03/2013 PCI: RCA 90p ( 2.5 x 23 mm DES); c. 02/2014 Cath: patent RCA stent->Med Rx; d. 04/2017 Cath: LM nl, LAd 30ost/p, 637m, D1 80ost, LCX 90p/m, OM1 85, RCA 70/20p, 8019m0d->Referred for CT Surg-felt to be poor candidate.   Coronary artery disease of native artery of native heart with stable angina pectoris ([place]/08/2017   [facility] Nuclear stress test showed inferior wall defect. Cardiac catheterization showed patent LAD stent with 90% stenosis in the proximal RCA. Ejection fraction was 50%. Status post PCI of the RCA with a 2.5 x 23 mm drug-eluting stent   Diabetic neuropathy ([place]CC[place]  Diabetic ulcer of right foot ([place]CC[place]  [place]    HFrEF (heart failure with reduced ejection fraction) ([place]CC[place]/22/2021   Hyperlipidemia    Hypertension    Ischemic cardiomyopathy    a. 04/2017 Echo: EF 25-30%; b. 08/2017 Cardiac MRI ([facility]): EF 19%, sev glob HK. RVEF 20%, mod BAE, triv [name], mild-mod TR. Basal lateral subendocardial infarct (viable), inf/infsept, dist septal ischemia, basal to mid lat peri-infarct ischemia.   Morbid obesity ([place]CC[place]  Neuropathy    PAD (peripheral artery disease) ([place]  a. Followed by Dr. DewLucky Cowboy. 08/2010 Periph Angio: RSFA 70-80p (6X60 self-expanding stent); c. 09/2010 Periph Angio: L SFA 100p (7x unknown length self-expanding stent); d.  06/2015 Periph Angio: R SFA short segment occlusion (6x12 self-expanding stent).   Restless leg syndrome    Sepsis (HCCJer[place]0/21/2019   Sleep apnea    Subclavian artery stenosis, right (HCC)    Syncope and collapse    Tobacco use    a. 75+ yr hx - still smoking 1ppd, down from 2 ppd.   Type II diabetes mellitus (HCC)    Unstable angina (HCCVal[place]/06/2016   Varicose vein    Past Surgical History:  Procedure Laterality Date   ABDOMINAL AORTAGRAM N/A 06/23/2013   Procedure: ABDOMINAL AORMaxcine HamSurgeon: MuhWellington HampshireD;  [facility];  Service: Cardiovascular;  Laterality: N/A;   AMPUTATION Right 04/01/2018   Procedure: AMPUTATION BELOW KNEE;  Surgeon: DewAlgernon HuxleyD;  Location: [facility] ORS;  Service: Vascular;  Laterality: Right;   AMPUTATION TOE Left 08/11/2020   Procedure: AMPUTATION TOE-2nd Toe;  Surgeon: FowSamara DeistPM;  Location: [facility] ORS;  Service: Podiatry;  Laterality: Left;   APPENDECTOMY     CARDIAC CATHETERIZATION  10/14  [facility]; X1 STENT PROXIMAL RCA   CARDIAC CATHETERIZATION  02/2010   [facility]   CARDIAC [facility]ATION  [facility]/21/2014   [facility]   CLAVICLE SURGERY     HERNIA REPAIR     IRRIGATION AND DEBRIDEMENT FOOT Right 01/04/2018   Procedure: IRRIGATION AND DEBRIDEMENT FOOT;  Surgeon: Samara Deist, DPM;  Location: [facility] ORS;  Service: Podiatry;  Laterality: Right;   LEFT HEART CATH AND CORONARY ANGIOGRAPHY N/A 01/20/2020   Procedure: LEFT HEART CATH AND CORONARY ANGIOGRAPHY;  Surgeon: Wellington Hampshire, MD;  Location: [place] [facility] LAB;  Service: Cardiovascular;  Laterality: N/A;   LEFT HEART CATH AND CORS/GRAFTS ANGIOGRAPHY N/A 04/28/2017   Procedure: LEFT HEART CATH AND CORONARY ANGIOGRAPHY;  Surgeon: Wellington Hampshire, MD;  Location: [place] [facility] LAB;  Service: Cardiovascular;  Laterality: N/A;   LOWER EXTREMITY ANGIOGRAPHY Right 03/03/2018   Procedure: LOWER EXTREMITY ANGIOGRAPHY;  Surgeon: Katha Cabal, MD;  Location: [place] [facility] LAB;  Service:  Cardiovascular;  Laterality: Right;   LOWER EXTREMITY ANGIOGRAPHY Left 08/24/2018   Procedure: LOWER EXTREMITY ANGIOGRAPHY;  Surgeon: Algernon Huxley, MD;  Location: [place] [facility] LAB;  Service: Cardiovascular;  Laterality: Left;   LOWER EXTREMITY ANGIOGRAPHY Left 06/14/2019   Procedure: LOWER EXTREMITY ANGIOGRAPHY;  Surgeon: Algernon Huxley, MD;  Location: [place] [facility] LAB;  Service: Cardiovascular;  Laterality: Left;   LOWER EXTREMITY ANGIOGRAPHY Left 08/02/2019   Procedure: LOWER EXTREMITY ANGIOGRAPHY;  Surgeon: Algernon Huxley, MD;  Location: [place] [facility] LAB;  Service: Cardiovascular;  Laterality: Left;   LOWER EXTREMITY ANGIOGRAPHY Left 07/17/2020   Procedure: LOWER EXTREMITY ANGIOGRAPHY;  Surgeon: Algernon Huxley, MD;  Location: [place] [facility] LAB;  Service: Cardiovascular;  Laterality: Left;   PERIPHERAL ARTERIAL STENT GRAFT     x2 left/right   PERIPHERAL VASCULAR BALLOON ANGIOPLASTY Right 01/06/2018   Procedure: PERIPHERAL VASCULAR BALLOON ANGIOPLASTY;  Surgeon: Katha Cabal, MD;  Location: [place] [facility] LAB;  Service: Cardiovascular;  Laterality: Right;   PERIPHERAL VASCULAR CATHETERIZATION N/A 07/06/2015   Procedure: Abdominal Aortogram w/Lower Extremity;  Surgeon: Algernon Huxley, MD;  Location: [place] [facility];  Service: Cardiovascular;  Laterality: N/A;   PERIPHERAL VASCULAR CATHETERIZATION  07/06/2015   Procedure: Lower Extremity Intervention;  Surgeon: Algernon Huxley, MD;  Location: [place] [facility] LAB;  Service: Cardiovascular;;   Family History  Problem Relation Age of Onset   Heart attack Father    Heart disease Father    Hypertension Father    Hyperlipidemia Father    Hypertension Mother    Hyperlipidemia Mother    Social History   Tobacco Use   Smoking status: Every Day    Packs/day: 1.00    Years: 41.00    Pack years: 41.00    Types: Cigarettes   Smokeless tobacco: Never  Substance Use Topics   Alcohol use: No   Allergies  Allergen Reactions    Dulaglutide Anaphylaxis, Diarrhea and Hives   Ace Inhibitors     hypotension   Other Itching    Skin itching associated with nitro patch   Prednisone Rash   Prior to Admission medications   Medication Sig Start Date End Date Taking? Authorizing Provider  albuterol (VENTOLIN HFA) 108 (90 Base) MCG/ACT inhaler Inhale into the lungs every 6 (six) hours as needed for wheezing or shortness of breath.   Yes [provider]  atorvastatin (LIPITOR) 80 MG tablet Take 1 tablet (80 mg total) by mouth daily. 10/09/20  Yes Minna Merritts,  MD  carvedilol (COREG) 3.125 MG tablet Take 1 tablet (3.125 mg total) by mouth 2 (two) times daily with a meal. 10/09/20  Yes Gollan, Kathlene November, MD  clopidogrel (PLAVIX) 75 MG tablet Take 1 tablet (75 mg total) by mouth daily. 10/09/20  Yes Minna Merritts, MD  digoxin (LANOXIN) 0.125 MG tablet Take 1 tablet (0.125 mg total) by mouth daily. 10/09/20  Yes Gollan, Kathlene November, MD  DULoxetine (CYMBALTA) 30 MG capsule Take 30 mg by mouth daily.    Yes [provider]  ELIQUIS 5 MG TABS tablet Take 5 mg by mouth 2 (two) times daily. 02/02/20  Yes [provider]  empagliflozin (JARDIANCE) 10 MG TABS tablet Take 10 mg by mouth daily.  02/03/20  Yes [provider]  ezetimibe (ZETIA) 10 MG tablet Take 1 tablet (10 mg total) by mouth daily. 10/09/20  Yes Minna Merritts, MD  gabapentin (NEURONTIN) 800 MG tablet Take 800 mg by mouth 4 (four) times daily.  08/14/17 04/04/29 Yes [provider]  ipratropium-albuterol (DUONEB) 0.5-2.5 (3) MG/3ML SOLN Inhale 3 mLs into the lungs every 6 (six) hours as needed (wheezing/sob).    Yes [provider]  isosorbide mononitrate (IMDUR) 30 MG 24 hr tablet Take 1 tablet (30 mg total) by mouth daily. 10/09/20  Yes Minna Merritts, MD  losartan (COZAAR) 25 MG tablet Take 0.5 tablets (12.5 mg total) by mouth daily. 10/09/20  Yes Minna Merritts, MD  metFORMIN (GLUCOPHAGE-XR) 500 MG 24 hr tablet Take  1,000 mg by mouth 2 (two) times daily. 01/02/21  Yes [provider]  Multiple Vitamin (MULTI-VITAMINS) TABS Take 1 tablet by mouth daily.    Yes [provider]  nitroGLYCERIN (NITROSTAT) 0.4 MG SL tablet Place 1 tablet (0.4 mg total) under the tongue every 5 (five) minutes as needed for chest pain. 10/09/20  Yes Minna Merritts, MD  omeprazole (PRILOSEC) 40 MG capsule Take 40 mg by mouth daily.   Yes [provider]  Oxycodone HCl 10 MG TABS Take 10 mg by mouth every 6 (six) hours as needed (pain).  01/15/18  Yes [provider]  polyethylene glycol (MIRALAX / GLYCOLAX) 17 g packet Take 17 g by mouth daily as needed for mild constipation. 08/12/20  Yes Wieting, Richard, MD  potassium chloride (KLOR-CON) 10 MEQ tablet Take 1 tablet (10 mEq total) by mouth 2 (two) times daily. 10/09/20  Yes Minna Merritts, MD  spironolactone (ALDACTONE) 25 MG tablet Take 0.5 tablets (12.5 mg total) by mouth daily. 10/09/20  Yes Minna Merritts, MD  sulfamethoxazole-trimethoprim (BACTRIM DS) 800-160 MG tablet Take 1 tablet by mouth 2 (two) times daily. 12/19/20  Yes Kris Hartmann, NP  SYMBICORT 80-4.5 MCG/ACT inhaler Inhale 1 puff into the lungs 2 (two) times daily. 01/01/20  Yes [provider]  tamsulosin (FLOMAX) 0.4 MG CAPS capsule Take 1 capsule (0.4 mg total) by mouth daily after supper. 07/01/20  Yes Shawna Clamp, MD  torsemide (DEMADEX) 20 MG tablet Take 2 tablets (40 mg total) by mouth daily. Patient taking differently: Take 40 mg by mouth 2 (two) times daily. Taking 2 in the AM and 1 at lunch 10/09/20  Yes Gollan, Kathlene November, MD  traZODone (DESYREL) 150 MG tablet Take 150 mg by mouth at bedtime. 07/12/19  Yes [provider]  VERQUVO 10 MG TABS TAKE 1 TABLET BY MOUTH ONCE DAILY 11/16/20  Yes Alisa Graff, FNP   Review of Systems  Constitutional:  Positive  for fatigue (moderate). Negative for appetite change.  HENT:  Negative for congestion, rhinorrhea, sore  throat and trouble swallowing.   Eyes: Negative.   Respiratory:  Positive for cough (productive) and shortness of breath (minimal).   Cardiovascular:  Positive for chest pain (at times) and leg swelling. Negative for palpitations.  Gastrointestinal:  Negative for abdominal distention and abdominal pain.  Endocrine: Negative.   Genitourinary: Negative.   Musculoskeletal:  Positive for arthralgias (right shoulder).  Allergic/Immunologic: Negative.   Neurological:  Positive for light-headedness. Negative for dizziness.  Hematological:  Negative for adenopathy. Bruises/bleeds easily.  Psychiatric/Behavioral:  Positive for dysphoric mood and sleep disturbance (not sleeping well due to mind racing). The patient is not nervous/anxious.    Vitals:   01/23/21 1313  BP: 108/74  Pulse: (!) 45  Resp: 16  SpO2: 91%  Weight: 197 lb (89.4 kg)  Height: '5\' 7"'$  (1.702 m)   Wt Readings from Last 3 Encounters:  01/23/21 197 lb (89.4 kg)  01/19/21 191 lb 12.8 oz (87 kg)  01/18/21 192 lb 6 oz (87.3 kg)   Lab Results  Component Value Date   CREATININE 1.38 (H) 01/19/2021   CREATININE 1.39 (H) 01/18/2021   CREATININE 1.24 09/28/2020    Physical Exam Vitals and nursing note reviewed. Exam conducted with a chaperone present (significant other).  Constitutional:      Appearance: Normal appearance.  HENT:     Head: Normocephalic and atraumatic.  Cardiovascular:     Rate and Rhythm: Regular rhythm. Bradycardia present.  Pulmonary:     Effort: Pulmonary effort is normal. No respiratory distress.     Breath sounds: No wheezing or rales.  Abdominal:     General: There is distension.     Palpations: Abdomen is soft.     Tenderness: There is no abdominal tenderness.  Musculoskeletal:     Cervical back: Normal range of motion and neck supple.     Right lower leg: No edema.     Left lower leg: Edema (1+ pitting edema) present.     Comments: Right lower leg prosthesis  Skin:    General: Skin is warm  and dry.     Findings: Bruising (on arms) present.  Neurological:     General: No focal deficit present.     Mental Status: He is alert and oriented to person, place, and time.  Psychiatric:        Mood and Affect: Mood normal.        Behavior: Behavior normal.        Thought Content: Thought content normal.    Assessment & Plan:  1: Acute on Chronic heart failure with reduced ejection fraction- - NYHA class III - minimally fluid overloaded today with weight gain & worsening edema/ shortness of breath - not weighing daily; encouraged to resume and to call for an overnight weight gain of >2 pounds or a weekly weight gain of >5 pounds - weight up 5 pounds from last visit 5 days ago - taking torsemide '40mg'$  AM/ '20mg'$  PM - will add metolazone 2.'5mg'$  daily; to take 1/2 hour prior to morning torsemide; 5 tablets given but told him once the weight comes down and swelling improves to stop taking them, hopefully he won't need all 5 tablets - he is to double his potassium on the days that he takes the metolazone - saw cardiology Rockey Situ) 10/09/20; called his office and scheduled f/u with Ignacia Bayley, NP in 2 days - check BMP at that visit -  bradycardic in the office but usually rebounds back up; talked with Dr. Rockey Situ who said they can do an EKG at office visit and if it's still low, consider stopping digoxin or reducing the dose - saw pulmonology Raul Del) 10/18/20 - palliative care visit done 08/03/20 - BNP 01/19/21 was 1964.1 - participating in paramedicine program   2: HTN- - BP looks good today - saw PCP Jodi Mourning) 12/27/20 - BMP done 01/19/21 reviewed and shows sodium 134, potassium 3.8, creatinine 1.38 and GFR 59  3: Tobacco use- - currently smoking ~ 3 cigarettes daily - complete cessation discussed for 3 minutes with him  4: Severe atherosclerosis of lower extremities- - had left lower leg angiogram 07/17/20 - saw vascular Owens Shark) 01/10/21 - 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 Explained that it's more helpful to bring the actual bottles as I don't know what each pill is by looking at them. I said that he understood but he just wanted me to see how many he takes a day.   Since he sees cardiology in 2 days he can return in 1 month or sooner for questions/issues before then.

## 2021-01-23 NOTE — Patient Instructions (Addendum)
Continue weighing daily and call for an overnight weight gain of > 2 pounds or a weekly weight gain of >5 pounds.    Take metolazone 1/2 hour before torsemide. Double your potassium on the days you take the  metolazone.

## 2021-01-25 ENCOUNTER — Other Ambulatory Visit: Payer: Self-pay

## 2021-01-25 ENCOUNTER — Ambulatory Visit (INDEPENDENT_AMBULATORY_CARE_PROVIDER_SITE_OTHER): Payer: Medicare Other | Admitting: Nurse Practitioner

## 2021-01-25 ENCOUNTER — Encounter: Payer: Self-pay | Admitting: Nurse Practitioner

## 2021-01-25 VITALS — BP 120/60 | HR 78 | Ht 67.0 in | Wt 194.5 lb

## 2021-01-25 DIAGNOSIS — L97909 Non-pressure chronic ulcer of unspecified part of unspecified lower leg with unspecified severity: Secondary | ICD-10-CM

## 2021-01-25 DIAGNOSIS — Z720 Tobacco use: Secondary | ICD-10-CM

## 2021-01-25 DIAGNOSIS — I70299 Other atherosclerosis of native arteries of extremities, unspecified extremity: Secondary | ICD-10-CM | POA: Diagnosis not present

## 2021-01-25 DIAGNOSIS — I4821 Permanent atrial fibrillation: Secondary | ICD-10-CM

## 2021-01-25 DIAGNOSIS — I10 Essential (primary) hypertension: Secondary | ICD-10-CM

## 2021-01-25 DIAGNOSIS — I5023 Acute on chronic systolic (congestive) heart failure: Secondary | ICD-10-CM

## 2021-01-25 DIAGNOSIS — I255 Ischemic cardiomyopathy: Secondary | ICD-10-CM

## 2021-01-25 DIAGNOSIS — J432 Centrilobular emphysema: Secondary | ICD-10-CM

## 2021-01-25 DIAGNOSIS — E785 Hyperlipidemia, unspecified: Secondary | ICD-10-CM

## 2021-01-25 DIAGNOSIS — I25118 Atherosclerotic heart disease of native coronary artery with other forms of angina pectoris: Secondary | ICD-10-CM | POA: Diagnosis not present

## 2021-01-25 DIAGNOSIS — N183 Chronic kidney disease, stage 3 unspecified: Secondary | ICD-10-CM

## 2021-01-25 NOTE — Progress Notes (Signed)
Office Visit    Patient Name: [name] [name] Date of Encounter: 01/25/2021  Primary Care Provider:  [name], MD Primary Cardiologist:  Ida Rogue, MD  Chief Complaint    60 year old male with a history of CAD status post prior PCI, ischemic cardiomyopathy, chronic combined systolic and diastolic congestive heart failure, pulmonary hypertension, tobacco abuse, COPD, hypertension, hyperlipidemia, permanent atrial fibrillation, obesity, obstructive sleep apnea, type 2 diabetes mellitus, stage III chronic kidney disease, peripheral arterial disease status post right BKA, and carotid arterial disease, presents for follow-up of heart failure.  Past Medical History    Past Medical History:  Diagnosis Date   Acute on chronic heart failure ([place])    AKI (acute kidney injury) ([place]) 01/03/2018   Angina at rest [facility]) 08/13/2017   Arthritis    Asthma    Atherosclerosis    BPH (benign prostatic hyperplasia)    Carotid arterial disease ([place])    Cellulitis of foot, left 09/27/2019   Charcot's joint of foot, right    Chronic combined systolic (congestive) and diastolic (congestive) heart failure ([place])    a. 02/2013 EF 50% by LV gram; b. 04/2017 Echo: EF 25-30%. diff HK. Gr2 DD; c. 06/2020 Echo: EF <20%, glob HK. RVS 53.43mHg. Sev BAE. Mild to mod TR.   Chronic foot pain (Secondary Area of Pain) (Right) 01/29/2018   COPD (chronic obstructive pulmonary disease) (HCC)    COPD with acute exacerbation ([place] 01/30/2020   Coronary artery disease    a. 2013 S/P PCI of LAD ([facility];  b. 03/2013 PCI: RCA 90p ( 2.5 x 23 mm DES); c. 02/2014 Cath: patent RCA stent->Med Rx; d. 04/2017 Cath: LM nl, LAd 30ost/p, 661m, D1 80ost, LCX 90p/m, OM1 85, RCA 70/20p, 8083m0d->Referred for CT Surg-felt to be poor candidate; d. 01/2020 Cath: LM nl, LAD 20p/m, 60d. D1 80. LCX 90p/m. LPAV 95, RCA 80/20p, 80p/m.   Diabetic neuropathy ([place]  Diabetic ulcer of right foot ([place]  Hernia    HFrEF (heart failure with  reduced ejection fraction) ([place]CC[place]8/22/2021   Hyperlipidemia    Hypertension    Ischemic cardiomyopathy    a. 02/2013 EF 50% by LV gram; b. 04/2017 Echo: EF 25-30%. diff HK. Gr2 DD; c. 06/2020 Echo: EF <20%, glob HK. RVS 53.2mm82m Sev BAE. Mild to mod TR.   Morbid obesity (HCC)[place] Neuropathy    PAD (peripheral artery disease) (HCC)[place] a. Followed by Dr. Dew;[place] 08/2010 Periph Angio: RSFA 70-80p (6X60 self-expanding stent); c. 09/2010 Periph Angio: L SFA 100p (7x unknown length self-expanding stent); d. 06/2015 Periph Angio: R SFA short segment occlusion (6x12 self-expanding stent).   Persistent atrial fibrillation (HCC)    Restless leg syndrome    Sepsis (HCC)[place]/21/2019   Sleep apnea    Subclavian artery stenosis, right (HCC)    Syncope and collapse    Tobacco use    a. 75+ yr hx - still smoking 1ppd, down from 2 ppd.   Type II diabetes mellitus (HCC)    Unstable angina (HCC)[place]/06/2016   Varicose vein    Past Surgical History:  Procedure Laterality Date   ABDOMINAL AORTAGRAM N/A 06/23/2013   Procedure: ABDOMINAL AORTMaxcine Hamurgeon: MuhaWellington Hampshire;  [facility];  Service: Cardiovascular;  Laterality: N/A;   AMPUTATION Right 04/01/2018   Procedure: AMPUTATION BELOW KNEE;  Surgeon: Dew,Algernon Huxley;  Location: [facility] ORS;  Service: Vascular;  Laterality: Right;  AMPUTATION TOE Left 08/11/2020   Procedure: AMPUTATION TOE-2nd Toe;  Surgeon: Samara Deist, DPM;  Location: [facility] ORS;  Service: Podiatry;  Laterality: Left;   APPENDECTOMY     CARDIAC CATHETERIZATION  10/14   [place]; X1 STENT PROXIMAL RCA   CARDIAC CATHETERIZATION  02/2010   [facility]   CARDIAC [facility]ATION  [facility]/21/2014   [facility]   CLAVICLE SURGERY     HERNIA REPAIR     IRRIGATION AND DEBRIDEMENT FOOT Right 01/04/2018   Procedure: IRRIGATION AND DEBRIDEMENT FOOT;  Surgeon: Samara Deist, DPM;  Location: [facility] ORS;  Service: Podiatry;  Laterality: Right;   LEFT HEART CATH AND CORONARY ANGIOGRAPHY N/A 01/20/2020    Procedure: LEFT HEART CATH AND CORONARY ANGIOGRAPHY;  Surgeon: Wellington Hampshire, MD;  Location: [place] CV LAB;  Service: Cardiovascular;  Laterality: N/A;   LEFT HEART CATH AND CORS/GRAFTS ANGIOGRAPHY N/A 04/28/2017   Procedure: LEFT HEART CATH AND CORONARY ANGIOGRAPHY;  Surgeon: Wellington Hampshire, MD;  Location: [place] CV LAB;  Service: Cardiovascular;  Laterality: N/A;   LOWER EXTREMITY ANGIOGRAPHY Right 03/03/2018   Procedure: LOWER EXTREMITY ANGIOGRAPHY;  Surgeon: Katha Cabal, MD;  Location: [place] CV LAB;  Service: Cardiovascular;  Laterality: Right;   LOWER EXTREMITY ANGIOGRAPHY Left 08/24/2018   Procedure: LOWER EXTREMITY ANGIOGRAPHY;  Surgeon: Algernon Huxley, MD;  Location: [place] CV LAB;  Service: Cardiovascular;  Laterality: Left;   LOWER EXTREMITY ANGIOGRAPHY Left 06/14/2019   Procedure: LOWER EXTREMITY ANGIOGRAPHY;  Surgeon: Algernon Huxley, MD;  Location: [place] CV LAB;  Service: Cardiovascular;  Laterality: Left;   LOWER EXTREMITY ANGIOGRAPHY Left 08/02/2019   Procedure: LOWER EXTREMITY ANGIOGRAPHY;  Surgeon: Algernon Huxley, MD;  Location: [place] CV LAB;  Service: Cardiovascular;  Laterality: Left;   LOWER EXTREMITY ANGIOGRAPHY Left 07/17/2020   Procedure: LOWER EXTREMITY ANGIOGRAPHY;  Surgeon: Algernon Huxley, MD;  Location: [place] CV LAB;  Service: Cardiovascular;  Laterality: Left;   PERIPHERAL ARTERIAL STENT GRAFT     x2 left/right   PERIPHERAL VASCULAR BALLOON ANGIOPLASTY Right 01/06/2018   Procedure: PERIPHERAL VASCULAR BALLOON ANGIOPLASTY;  Surgeon: Katha Cabal, MD;  Location: [place] [facility] LAB;  Service: Cardiovascular;  Laterality: Right;   PERIPHERAL VASCULAR CATHETERIZATION N/A 07/06/2015   Procedure: Abdominal Aortogram w/Lower Extremity;  Surgeon: Algernon Huxley, MD;  Location: [place] CV LAB;  Service: Cardiovascular;  Laterality: N/A;   PERIPHERAL VASCULAR CATHETERIZATION  07/06/2015   Procedure: Lower Extremity  Intervention;  Surgeon: Algernon Huxley, MD;  Location: [place] CV LAB;  Service: Cardiovascular;;    Allergies  Allergies  Allergen Reactions   Dulaglutide Anaphylaxis, Diarrhea and Hives   Ace Inhibitors     hypotension   Other Itching    Skin itching associated with nitro patch   Prednisone Rash    History of Present Illness    60 year old male with above complex past medical history including coronary artery disease, ischemic cardiomyopathy, chronic combined systolic diastolic congestive heart failure, pulmonary hypertension, tobacco abuse, COPD, hypertension, hyperlipidemia, persistent atrial fibrillation, obesity, sleep apnea, diabetes, peripheral arterial disease status post right BKA, carotid arterial disease, stage III chronic kidney disease, and noncompliance.  He previously underwent PCI of the LAD in 2013 with PCI of the RCA in October 2014.  Catheterization in November 2018 revealed severe multivessel coronary artery disease.  He was seen by CT surgery and not felt to be a suitable candidate.  He has a history of LV dysfunction with an EF of less  than 20% by echo in January of this year.  He had prolonged hospitalization at that time for significant volume overload and low output heart failure.  He was doing well when seen by Dr. Rockey Situ in May of this year.  On August 12, he was seen in the emergency department after a fall in the setting of lightheadedness.  He was initially given IV Lasix.  His troponin was elevated at 299.  He was hypoxic on room air with low to mid 80s.  Admission was recommended but he declined.  He was seen in heart failure clinic follow-up 2 days ago with complaints of worsening dyspnea on exertion and fatigue.  Metolazone 2.5 mg daily was added to his torsemide therapy with plan for follow-up and basic metabolic panel today.  Since his heart failure clinic visit, weight is down about 3 pounds.  Looking at his weight trends, he seems to do best when he is in  the low to mid 180s.  He continues complain of fatigue and dyspnea.  He does have left lower extremity swelling and notes that his abdomen is more firm/tight than usual.  He has not had any significant chest pain.  He denies palpitations, PND, orthopnea, dizziness, syncope, or early satiety.  Home Medications    Current Outpatient Medications  Medication Sig Dispense Refill   albuterol (VENTOLIN HFA) 108 (90 Base) MCG/ACT inhaler Inhale into the lungs every 6 (six) hours as needed for wheezing or shortness of breath.     atorvastatin (LIPITOR) 80 MG tablet Take 1 tablet (80 mg total) by mouth daily. 90 tablet 3   carvedilol (COREG) 3.125 MG tablet Take 1 tablet (3.125 mg total) by mouth 2 (two) times daily with a meal. 90 tablet 3   clopidogrel (PLAVIX) 75 MG tablet Take 1 tablet (75 mg total) by mouth daily. 90 tablet 3   digoxin (LANOXIN) 0.125 MG tablet Take 1 tablet (0.125 mg total) by mouth daily. 90 tablet 3   DULoxetine (CYMBALTA) 30 MG capsule Take 30 mg by mouth daily.      ELIQUIS 5 MG TABS tablet Take 5 mg by mouth 2 (two) times daily.     empagliflozin (JARDIANCE) 10 MG TABS tablet Take 10 mg by mouth daily.      ezetimibe (ZETIA) 10 MG tablet Take 1 tablet (10 mg total) by mouth daily. 90 tablet 3   gabapentin (NEURONTIN) 800 MG tablet Take 800 mg by mouth 4 (four) times daily.      ipratropium-albuterol (DUONEB) 0.5-2.5 (3) MG/3ML SOLN Inhale 3 mLs into the lungs every 6 (six) hours as needed (wheezing/sob).      isosorbide mononitrate (IMDUR) 30 MG 24 hr tablet Take 1 tablet (30 mg total) by mouth daily. 90 tablet 3   losartan (COZAAR) 25 MG tablet Take 0.5 tablets (12.5 mg total) by mouth daily. 46 tablet 3   metFORMIN (GLUCOPHAGE-XR) 500 MG 24 hr tablet Take 1,000 mg by mouth 2 (two) times daily.     metolazone (ZAROXOLYN) 2.5 MG tablet Take 1 tablet (2.5 mg total) by mouth daily as needed. Take 1/2 hour before morning torsemide 5 tablet 0   Multiple Vitamin (MULTI-VITAMINS)  TABS Take 1 tablet by mouth daily.      nitroGLYCERIN (NITROSTAT) 0.4 MG SL tablet Place 1 tablet (0.4 mg total) under the tongue every 5 (five) minutes as needed for chest pain. 30 tablet 5   omeprazole (PRILOSEC) 40 MG capsule Take 40 mg by mouth daily.  Oxycodone HCl 10 MG TABS Take 10 mg by mouth every 6 (six) hours as needed (pain).      polyethylene glycol (MIRALAX / GLYCOLAX) 17 g packet Take 17 g by mouth daily as needed for mild constipation. 14 each 0   potassium chloride (KLOR-CON) 10 MEQ tablet Take 1 tablet (10 mEq total) by mouth 2 (two) times daily. 180 tablet 3   spironolactone (ALDACTONE) 25 MG tablet Take 0.5 tablets (12.5 mg total) by mouth daily. 90 tablet 3   sulfamethoxazole-trimethoprim (BACTRIM DS) 800-160 MG tablet Take 1 tablet by mouth 2 (two) times daily. 14 tablet 0   SYMBICORT 80-4.5 MCG/ACT inhaler Inhale 1 puff into the lungs 2 (two) times daily.     tamsulosin (FLOMAX) 0.4 MG CAPS capsule Take 1 capsule (0.4 mg total) by mouth daily after supper. 30 capsule 1   torsemide (DEMADEX) 20 MG tablet Take 40 mg by mouth 2 (two) times daily.     traZODone (DESYREL) 150 MG tablet Take 150 mg by mouth at bedtime.     VERQUVO 10 MG TABS TAKE 1 TABLET BY MOUTH ONCE DAILY 30 tablet 5   No current facility-administered medications for this visit.     Review of Systems    Ongoing increase in lower extremity swelling and abdominal girth with associated dyspnea with minimal activity.  He denies any significant chest pain, palpitations, PND, orthopnea, dizziness, syncope, or early satiety.  All other systems reviewed and are otherwise negative except as noted above.  Physical Exam    VS:  BP 120/60 (BP Location: Left Arm, Patient Position: Sitting, Cuff Size: Normal)   Pulse 78   Ht '5\' 7"'$  (1.702 m)   Wt 194 lb 8 oz (88.2 kg)   SpO2 97%   BMI 30.46 kg/m  , BMI Body mass index is 30.46 kg/m.     GEN: Well nourished, well developed, in no acute distress. HEENT:  normal. Neck: Supple, moderately elevated JVP, no carotid bruits, or masses. Cardiac: Irregularly irregular, 1/6 systolic murmur at the apex, no rubs, or gallops. No clubbing, cyanosis.  2+ left lower leg edema.  Right BKA.  Radials 2+ and equal bilaterally.  Respiratory:  Respirations regular and unlabored, bibasilar crackles with scattered rhonchi. GI: Semifirm with trace flank edema.  He is mildly tender to deep palpation.  BS + x 4. MS: no deformity or atrophy. Skin: warm and dry, no rash. Neuro:  Strength and sensation are intact. Psych: Flat affect.  Accessory Clinical Findings    ECG personally reviewed by me today -atrial fibrillation, 78, left axis deviation, right bundle branch block, inferior infarct, question anterior infarct in the setting of abnormal R wave progression-no acute changes.  Lab Results  Component Value Date   WBC 4.4 01/19/2021   HGB 11.4 (L) 01/19/2021   HCT 34.6 (L) 01/19/2021   MCV 78.8 (L) 01/19/2021   PLT 151 01/19/2021   Lab Results  Component Value Date   CREATININE 1.38 (H) 01/19/2021   BUN 23 (H) 01/19/2021   NA 134 (L) 01/19/2021   K 3.8 01/19/2021   CL 96 (L) 01/19/2021   CO2 30 01/19/2021   Lab Results  Component Value Date   ALT 13 08/22/2020   AST 26 08/22/2020   ALKPHOS 80 08/22/2020   BILITOT 1.7 (H) 08/22/2020   Lab Results  Component Value Date   CHOL 118 01/20/2020   HDL 34 (L) 01/20/2020   LDLCALC 77 01/20/2020   TRIG 37 01/20/2020  CHOLHDL 3.5 01/20/2020    Lab Results  Component Value Date   HGBA1C 6.5 (H) 08/06/2020    Assessment & Plan    1.  Acute on chronic combined systolic diastolic congestive heart failure/ischemic cardiomyopathy: EF less than 20% by echo in January 2022.  He was seen in heart failure clinic 2 days ago noted to be volume overloaded.  His weight is roughly 10 pounds above her prior dry weights.  He was placed on metolazone in addition to torsemide 40 mg twice daily just 2 days ago and his  weight is down 3 pounds.  He does not think he has had any significant change in his urine output up to this point.  He continues to have significant dyspnea on exertion and fatigue.  He remains volume overloaded on examination today with trace flank edema, left lower extremity edema, and JVD.  I will follow-up a basic metabolic panel, magnesium, and digoxin level today.  Provided that labs are stable, will plan to continue current doses of metolazone and torsemide however, if renal function significantly worsened, will need to consider admission for IV diuresis +/- inotropic support.  Continue beta-blocker, digoxin, ARB, nitrate, verquvo, SGLT2 inhibitor, and spironolactone.  He will need early follow-up in heart failure clinic next week.  2.  Coronary artery disease: Last cath in August 2021 showed stable anatomy including severe circumflex and RCA disease.  He has been medically managed.  He was seen in the ED on August 12 following a fall, at which time he was noted to have an elevated troponin to 341.  He has not been having any significant chest pain.  Suspect some degree of chronic demand ischemia in the setting of low EF, especially as his BNP was also elevated at 1900 that day.  Previously not felt to be a good surgical candidate.  No plan for ischemic evaluation at this time.  He remains on beta-blocker, statin, Plavix, and nitrate therapy.  3.  Essential hypertension: Stable.  4.  Hyperlipidemia: LDL of 77 last year.  He is currently on high potency statin therapy.  He is due for follow-up lipids but is not fasting today.  5.  Tobacco abuse/COPD: Continues to smoke.  Not considering quitting.  Cessation advised.  6.  Type 2 diabetes mellitus: Hemoglobin A1c 6.5.  He is on metformin and Jardiance.  7.  Stage III chronic kidney disease: Follow-up lab work today.  8.  Peripheral arterial disease/carotid arterial disease: Followed by vascular surgery.  9.  Permanent atrial fibrillation: Well  rate controlled.  Anticoagulate with Eliquis.  10.  Disposition: Follow-up basic metabolic panel, magnesium, and digoxin level today.  Follow-up in heart failure clinic in 1 week.  Murray Hodgkins, NP 01/25/2021, 1:56 PM

## 2021-01-25 NOTE — Patient Instructions (Signed)
Medication Instructions:  No changes at this time  *If you need a refill on your cardiac medications before your next appointment, please call your pharmacy*   Lab Work: BMET, Mag, and digoxin level  If you have labs (blood work) drawn today and your tests are completely normal, you will receive your results only by: [place] (if you have MyChart) OR A paper copy in the mail If you have any lab test that is abnormal or we need to change your treatment, we will call you to review the results.   Testing/Procedures: None   Follow-Up: At [facility], you and your health needs are our priority.  As part of our continuing mission to provide you with exceptional heart care, we have created designated Provider Care Teams.  These Care Teams include your primary Cardiologist (physician) and Advanced Practice Providers (APPs -  Physician Assistants and Nurse Practitioners) who all work together to provide you with the care you need, when you need it.  Your next appointment:    Follow up with Darylene Price next Wednesday 01/31/21 at 2:00 pm in the Heart Failure Clinic  Follow up in 1 month(s) with Dr. Rockey Situ  The format for your next appointment:   In Person  Provider:   Ida Rogue, MD

## 2021-01-26 ENCOUNTER — Encounter: Payer: Self-pay | Admitting: *Deleted

## 2021-01-26 ENCOUNTER — Telehealth: Payer: Self-pay | Admitting: *Deleted

## 2021-01-26 LAB — BASIC METABOLIC PANEL
BUN/Creatinine Ratio: 14 (ref 9–20)
BUN: 18 mg/dL (ref 6–24)
CO2: 29 mmol/L (ref 20–29)
Calcium: 9.7 mg/dL (ref 8.7–10.2)
Chloride: 99 mmol/L (ref 96–106)
Creatinine, Ser: 1.3 mg/dL — ABNORMAL HIGH (ref 0.76–1.27)
Glucose: 105 mg/dL — ABNORMAL HIGH (ref 65–99)
Potassium: 4.4 mmol/L (ref 3.5–5.2)
Sodium: 140 mmol/L (ref 134–144)
eGFR: 64 mL/min/{1.73_m2} (ref >59)

## 2021-01-26 LAB — DIGOXIN LEVEL: Digoxin, Serum: 0.8 ng/mL (ref 0.5–0.9)

## 2021-01-26 LAB — MAGNESIUM: Magnesium: 1.7 mg/dL (ref 1.6–2.3)

## 2021-01-26 NOTE — Telephone Encounter (Signed)
-----   Message from Theora Gianotti, NP sent at 01/26/2021  9:55 AM EDT ----- Kidney function and electrolytes are stable.  Digoxin level is normal.  Continue current doses of torsemide and metolazone with plan for follow-up in CHF clinic next week.

## 2021-01-26 NOTE — Telephone Encounter (Signed)
Called all numbers and call can not be completed at this time. Other number listed had no answer/voicemail box is full. Will send My Chart message.

## 2021-01-26 NOTE — Telephone Encounter (Signed)
No answer and then busy signal

## 2021-01-29 NOTE — Telephone Encounter (Signed)
Unable to reach patient. Letter with results and recommendations sent to patient.

## 2021-01-30 ENCOUNTER — Telehealth (HOSPITAL_COMMUNITY): Payer: Self-pay

## 2021-01-30 NOTE — Telephone Encounter (Signed)
Today spoke with [facility], he states doing ok, he states been a rough day.  He states got his pads for his chair I had ordered, making a difference.  He still has not heard back from [facility] about his chair or from his PCP.  Contacted them and the receptionist advised will send the forms to his provider to fax the forms and contact him about the chair.  He denies any problems or concerns.  He states does not need anything, has plenty for daily living.  He is aware of up coming appts.  He has all his medications and aware of how to take them.  He is grateful for his life and things he can still do.  Will continue to visit for heart failure.   [place] [phone]

## 2021-01-31 ENCOUNTER — Ambulatory Visit: Payer: Medicare Other | Admitting: Family

## 2021-01-31 ENCOUNTER — Telehealth: Payer: Self-pay | Admitting: Family

## 2021-01-31 NOTE — Telephone Encounter (Signed)
Patient did not show for his Heart Failure Clinic appointment on 01/31/21. Will attempt to reschedule.

## 2021-01-31 NOTE — Progress Notes (Deleted)
Patient ID: [name] [name], male    DOB: 12-05-1960, 60 y.o.   MRN: [id number]  HPI  [name] [name] is a 60 y/o male with a history of obstructive sleep apnea, PVD, PAD, HTN, hyperlipidemia, DM, CAD, COPD, BPH, asthma, current tobacco use and chronic heart failure.   Echo report from 06/17/20 reviewed and showed an EF of <20% along with elevated PA pressure of 53.2 mmHg and mild/ moderate TR. Echo report from 01/20/20 reviewed and showed an EF of <20% along with mild [name] and moderate TR. Echo report from 09/29/19 reviewed and showed an EF of 20-25% along with moderately elevated PA pressure, mild [name] and mild/moderate TR. Echo report from 11/06/2018 reviewed and showed an EF of 20-25% along with moderate TR and a PA pressure of 59.6 mmHg. Echo report from 04/25/17 reviewed and shows an EF of 25-30% along with a PA pressure of 34 mm Hg. EF has declined from 55-60% back in 2016.   LHC done 01/20/20 showed: Prox RCA-1 lesion is 80% stenosed. Prox RCA-2 lesion is 20% stenosed. Prox RCA to Mid RCA lesion is 80% stenosed. Prox LAD lesion is 20% stenosed. 1st Diag lesion is 80% stenosed. Mid LAD lesion is 20% stenosed. Dist LAD lesion is 60% stenosed. Prox Cx to Mid Cx lesion is 90% stenosed. LPAV lesion is 95% stenosed.   1. Significant underlying three-vessel coronary artery disease. Patent proximal LAD stent with mild in-stent restenosis. Moderate diffuse disease in the distal LAD. Significant stenosis in the proximal left circumflex at the origin of the posterior AV groove artery which is also heavily diseased at the ostium. This is a bifurcation lesion and heavily calcified. RCA stent is patent. However, there is significant proximal disease in the whole mid to distal segment is diffusely diseased and small caliber. 2. Left ventricular angiography was not performed. EF was severe reduced by echo. 3. Moderately elevated left ventricular end-diastolic pressure at 28 mmHg  Cardiac catheterization done 04/28/17  showed significant three-vessel disease with a patient stent in the RCA and LAD. Significant proximal RCS disease and the stent as well as diffuse mid and distal disease. LAD has moderate disease. Severely elevated left ventricular end-diastolic pressure at 34 mmHg. Possible CABG in the future with optimizing medical management. Stress done in 2015.  Was in the ED  01/19/21 due to fall out of bed after feeling light-headed along with feeling short of breath. Initially given IV lasix. Critically high troponin of 299. Was hypoxic on room air into the low-mid 80's. Admission recommended but patient declined. Was in the ED 08/22/20 due to upper back and shoulder pain. Hypotensive so IVF given along with antibiotics. Symptoms improved and he was released. Admitted 08/05/20 due to Osteomyelitis of the left second toe with diabetic foot infection and cellulitis. Podiatry and cardiology consults obtained. Amputation of the left second toe performed on 08/11/2020. Given IV antibiotics and then transitioned to oral medications. Given IV lasix due to HF exacerbation and then switched to oral diuretics.   Discharged after 7 days.   He presents today for a follow-up visit with a chief complaint of   Past Medical History:  Diagnosis Date   Acute on chronic heart failure ([place])    AKI (acute kidney injury) ([place]) 01/03/2018   Angina at rest [facility]) 08/13/2017   Arthritis    Asthma    Atherosclerosis    BPH (benign prostatic hyperplasia)    Carotid arterial disease ([place])    Cellulitis of foot, left 09/27/2019  Charcot's joint of foot, right    Chronic combined systolic (congestive) and diastolic (congestive) heart failure ([place])    a. 02/2013 EF 50% by LV gram; b. 04/2017 Echo: EF 25-30%. diff HK. Gr2 DD; c. 06/2020 Echo: EF <20%, glob HK. RVS 53.33mHg. Sev BAE. Mild to mod TR.   Chronic foot pain (Secondary Area of Pain) (Right) 01/29/2018   COPD (chronic obstructive pulmonary disease) (HCC)    COPD with acute  exacerbation ([place] 01/30/2020   Coronary artery disease    a. 2013 S/P PCI of LAD ([facility];  b. 03/2013 PCI: RCA 90p ( 2.5 x 23 mm DES); c. 02/2014 Cath: patent RCA stent->Med Rx; d. 04/2017 Cath: LM nl, LAd 30ost/p, 652m, D1 80ost, LCX 90p/m, OM1 85, RCA 70/20p, 808m0d->Referred for CT Surg-felt to be poor candidate; d. 01/2020 Cath: LM nl, LAD 20p/m, 60d. D1 80. LCX 90p/m. LPAV 95, RCA 80/20p, 80p/m.   Diabetic neuropathy ([facility]  Diabetic ulcer of right foot ([facility]  Hernia    HFrEF (heart failure with reduced ejection fraction) ([place]8/22/2021   Hyperlipidemia    Hypertension    Ischemic cardiomyopathy    a. 02/2013 EF 50% by LV gram; b. 04/2017 Echo: EF 25-30%. diff HK. Gr2 DD; c. 06/2020 Echo: EF <20%, glob HK. RVS 53.2mm86m Sev BAE. Mild to mod TR.   Morbid obesity (HCC)[place] Neuropathy    PAD (peripheral artery disease) (HCC)[place] a. Followed by Dr. Dew;[place] 08/2010 Periph Angio: RSFA 70-80p (6X60 self-expanding stent); c. 09/2010 Periph Angio: L SFA 100p (7x unknown length self-expanding stent); d. 06/2015 Periph Angio: R SFA short segment occlusion (6x12 self-expanding stent).   Persistent atrial fibrillation (HCC)    Restless leg syndrome    Sepsis (HCC)[place]/21/2019   Sleep apnea    Subclavian artery stenosis, right (HCC)    Syncope and collapse    Tobacco use    a. 75+ yr hx - still smoking 1ppd, down from 2 ppd.   Type II diabetes mellitus (HCC)    Unstable angina (HCC)[place]/06/2016   Varicose vein    Past Surgical History:  Procedure Laterality Date   ABDOMINAL AORTAGRAM N/A 06/23/2013   Procedure: ABDOMINAL AORTMaxcine Hamurgeon: MuhaWellington Hampshire;  [facility];  Service: Cardiovascular;  Laterality: N/A;   AMPUTATION Right 04/01/2018   Procedure: AMPUTATION BELOW KNEE;  Surgeon: Dew,Algernon Huxley;  Location: [facility] ORS;  Service: Vascular;  Laterality: Right;   AMPUTATION TOE Left 08/11/2020   Procedure: AMPUTATION TOE-2nd Toe;  Surgeon: FowlSamara DeistM;  Location: [facility]  ORS;  Service: Podiatry;  Laterality: Left;   APPENDECTOMY     CARDIAC CATHE[facility]  [facility]   [facility]   CLAVICLE SURGERY     HERNIA REPAIR     IRRIGATION AND DEBRIDEMENT FOOT Right 01/04/2018   Procedure: IRRIGATION AND DEBRIDEMENT FOOT;  Surgeon: FowlSamara DeistM;  Location: [facility] ORS;  Service: Podiatry;  Laterality: Right;   LEFT HEART CATH AND CORONARY ANGIOGRAPHY N/A 01/20/2020   Procedure: LEFT HEART CATH AND CORONARY ANGIOGRAPHY;  Surgeon: AridWellington Hampshire;  [facility];  Service: Cardiovascular;  Laterality: N/A;   LEFT HEART CATH AND CORS/GRAFTS ANGIOGRAPHY N/A 04/28/2017   Procedure: LEFT HEART CATH AND CORONARY ANGIOGRAPHY;  Surgeon: AridWellington Hampshire;  Location:  [place] CV LAB;  Service: Cardiovascular;  Laterality: N/A;   LOWER EXTREMITY ANGIOGRAPHY Right 03/03/2018   Procedure: LOWER EXTREMITY ANGIOGRAPHY;  Surgeon: Katha Cabal, MD;  Location: [place] CV LAB;  Service: Cardiovascular;  Laterality: Right;   LOWER EXTREMITY ANGIOGRAPHY Left 08/24/2018   Procedure: LOWER EXTREMITY ANGIOGRAPHY;  Surgeon: Algernon Huxley, MD;  Location: [place] CV LAB;  Service: Cardiovascular;  Laterality: Left;   LOWER EXTREMITY ANGIOGRAPHY Left 06/14/2019   Procedure: LOWER EXTREMITY ANGIOGRAPHY;  Surgeon: Algernon Huxley, MD;  Location: [place] CV LAB;  Service: Cardiovascular;  Laterality: Left;   LOWER EXTREMITY ANGIOGRAPHY Left 08/02/2019   Procedure: LOWER EXTREMITY ANGIOGRAPHY;  Surgeon: Algernon Huxley, MD;  Location: [place] CV LAB;  Service: Cardiovascular;  Laterality: Left;   LOWER EXTREMITY ANGIOGRAPHY Left 07/17/2020   Procedure: LOWER EXTREMITY ANGIOGRAPHY;  Surgeon: Algernon Huxley, MD;  Location: [place] CV LAB;  Service: Cardiovascular;  Laterality: Left;   PERIPHERAL ARTERIAL STENT GRAFT     x2 left/right   PERIPHERAL  VASCULAR BALLOON ANGIOPLASTY Right 01/06/2018   Procedure: PERIPHERAL VASCULAR BALLOON ANGIOPLASTY;  Surgeon: Katha Cabal, MD;  Location: [place] CV LAB;  Service: Cardiovascular;  Laterality: Right;   PERIPHERAL VASCULAR CATHETERIZATION N/A 07/06/2015   Procedure: Abdominal Aortogram w/Lower Extremity;  Surgeon: Algernon Huxley, MD;  Location: [place] CV LAB;  Service: Cardiovascular;  Laterality: N/A;   PERIPHERAL VASCULAR CATHETERIZATION  07/06/2015   Procedure: Lower Extremity Intervention;  Surgeon: Algernon Huxley, MD;  Location: [place] CV LAB;  Service: Cardiovascular;;   Family History  Problem Relation Age of Onset   Heart attack Father    Heart disease Father    Hypertension Father    Hyperlipidemia Father    Hypertension Mother    Hyperlipidemia Mother    Social History   Tobacco Use   Smoking status: Every Day    Packs/day: 1.00    Years: 41.00    Pack years: 41.00    Types: Cigarettes   Smokeless tobacco: Never  Substance Use Topics   Alcohol use: No   Allergies  Allergen Reactions   Dulaglutide Anaphylaxis, Diarrhea and Hives   Ace Inhibitors     hypotension   Other Itching    Skin itching associated with nitro patch   Prednisone Rash    Review of Systems  Constitutional:  Positive for fatigue (moderate). Negative for appetite change.  HENT:  Negative for congestion, rhinorrhea, sore throat and trouble swallowing.   Eyes: Negative.   Respiratory:  Positive for cough (productive) and shortness of breath (minimal).   Cardiovascular:  Positive for chest pain (at times) and leg swelling. Negative for palpitations.  Gastrointestinal:  Negative for abdominal distention and abdominal pain.  Endocrine: Negative.   Genitourinary: Negative.   Musculoskeletal:  Positive for arthralgias (right shoulder).  Allergic/Immunologic: Negative.   Neurological:  Positive for light-headedness. Negative for dizziness.  Hematological:  Negative for adenopathy.  Bruises/bleeds easily.  Psychiatric/Behavioral:  Positive for dysphoric mood and sleep disturbance (not sleeping well due to mind racing). The patient is not nervous/anxious.       Physical Exam Vitals and nursing note reviewed. Exam conducted with a chaperone present (significant other).  Constitutional:      Appearance: Normal appearance.  HENT:     Head: Normocephalic and atraumatic.  Cardiovascular:     Rate and Rhythm: Regular rhythm. Bradycardia present.  Pulmonary:     Effort: Pulmonary effort is normal. No respiratory  distress.     Breath sounds: No wheezing or rales.  Abdominal:     General: There is distension.     Palpations: Abdomen is soft.     Tenderness: There is no abdominal tenderness.  Musculoskeletal:     Cervical back: Normal range of motion and neck supple.     Right lower leg: No edema.     Left lower leg: Edema (1+ pitting edema) present.     Comments: Right lower leg prosthesis  Skin:    General: Skin is warm and dry.     Findings: Bruising (on arms) present.  Neurological:     General: No focal deficit present.     Mental Status: He is alert and oriented to person, place, and time.  Psychiatric:        Mood and Affect: Mood normal.        Behavior: Behavior normal.        Thought Content: Thought content normal.    Assessment & Plan:  1: Acute on Chronic heart failure with reduced ejection fraction- - NYHA class III - minimally fluid overloaded today with weight gain & worsening edema/ shortness of breath - not weighing daily; encouraged to resume and to call for an overnight weight gain of >2 pounds or a weekly weight gain of >5 pounds - weight 197 pounds from last visit 1 week ago - saw cardiology Sharolyn Douglas) 01/25/21 - saw pulmonology Raul Del) 10/18/20 - palliative care visit done 08/03/20 - BNP 01/19/21 was 1964.1 - participating in paramedicine program  - dig level 01/25/21 was 0.8  2: HTN- - BP  - saw PCP Jodi Mourning) 12/27/20 - BMP done 01/25/21  reviewed and shows sodium 140, potassium 4.4, creatinine 1.3 and GFR 64  3: Tobacco use- - currently smoking ~ 3 cigarettes daily - complete cessation discussed for 3 minutes with him  4: Severe atherosclerosis of lower extremities- - had left lower leg angiogram 07/17/20 - saw vascular Owens Shark) 01/10/21 - 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

## 2021-02-06 ENCOUNTER — Telehealth (HOSPITAL_COMMUNITY): Payer: Self-pay

## 2021-02-06 ENCOUNTER — Telehealth: Payer: Self-pay | Admitting: Primary Care

## 2021-02-06 NOTE — Telephone Encounter (Signed)
Today contacted [name] [name] about missing his HF clinic, he states that it was just a follow up and he did not have to go.  He has another appt with HF clinic and cardiology in Sept.  He states been doing ok, he sounds depressed, he states just tired.  He states been taking a friend back and forth to hospital.  He states has everything such as meds and daily living items.  He was not talkative.  Will try to make a home visit next week.  Will continue to visit for heart failure.   [place] [phone]

## 2021-02-06 NOTE — Telephone Encounter (Signed)
Call to f/u to see if he wants to book further palliative services. Message left.

## 2021-02-07 ENCOUNTER — Telehealth (INDEPENDENT_AMBULATORY_CARE_PROVIDER_SITE_OTHER): Payer: Self-pay | Admitting: Nurse Practitioner

## 2021-02-07 NOTE — Telephone Encounter (Signed)
Can we send a verbal order for supplies to home care nurse?

## 2021-02-07 NOTE — Telephone Encounter (Signed)
Patient states another blister has come up on his left leg and wanted to know if you all can order some supplies so they can wrap his leg.  Patient states nurses were coming to wrap his legs and had some supplies left over, and they were doing it themselves and would like to continue if possible.  Please advise.

## 2021-02-07 NOTE — Telephone Encounter (Signed)
That is fine 

## 2021-02-09 NOTE — Telephone Encounter (Signed)
Called Sarah at [facility]  an made her aware that the Verbal order was approved.

## 2021-02-19 ENCOUNTER — Telehealth (HOSPITAL_COMMUNITY): Payer: Self-pay

## 2021-02-19 NOTE — Telephone Encounter (Signed)
Attempted to contact Azul but he was asleep, advised his girlfriend that his appt for tomorrow with HF clinic has changed and gave her new time and date.  She states will tell him and she marked it on his calendar.  [place] [phone]

## 2021-02-20 ENCOUNTER — Ambulatory Visit: Payer: Medicare Other | Admitting: Family

## 2021-02-27 ENCOUNTER — Ambulatory Visit: Payer: Medicare HMO | Attending: Family | Admitting: Family

## 2021-02-27 ENCOUNTER — Other Ambulatory Visit: Payer: Self-pay

## 2021-02-27 ENCOUNTER — Encounter: Payer: Self-pay | Admitting: Family

## 2021-02-27 VITALS — BP 109/59 | HR 81 | Resp 18 | Ht 67.0 in | Wt 181.1 lb

## 2021-02-27 DIAGNOSIS — Z7901 Long term (current) use of anticoagulants: Secondary | ICD-10-CM | POA: Insufficient documentation

## 2021-02-27 DIAGNOSIS — Z8349 Family history of other endocrine, nutritional and metabolic diseases: Secondary | ICD-10-CM | POA: Diagnosis not present

## 2021-02-27 DIAGNOSIS — I110 Hypertensive heart disease with heart failure: Secondary | ICD-10-CM | POA: Diagnosis not present

## 2021-02-27 DIAGNOSIS — Z8249 Family history of ischemic heart disease and other diseases of the circulatory system: Secondary | ICD-10-CM | POA: Insufficient documentation

## 2021-02-27 DIAGNOSIS — Z79899 Other long term (current) drug therapy: Secondary | ICD-10-CM | POA: Diagnosis not present

## 2021-02-27 DIAGNOSIS — Z888 Allergy status to other drugs, medicaments and biological substances status: Secondary | ICD-10-CM | POA: Diagnosis not present

## 2021-02-27 DIAGNOSIS — N400 Enlarged prostate without lower urinary tract symptoms: Secondary | ICD-10-CM | POA: Insufficient documentation

## 2021-02-27 DIAGNOSIS — Z955 Presence of coronary angioplasty implant and graft: Secondary | ICD-10-CM | POA: Insufficient documentation

## 2021-02-27 DIAGNOSIS — F17210 Nicotine dependence, cigarettes, uncomplicated: Secondary | ICD-10-CM | POA: Insufficient documentation

## 2021-02-27 DIAGNOSIS — I70209 Unspecified atherosclerosis of native arteries of extremities, unspecified extremity: Secondary | ICD-10-CM | POA: Insufficient documentation

## 2021-02-27 DIAGNOSIS — Z720 Tobacco use: Secondary | ICD-10-CM

## 2021-02-27 DIAGNOSIS — I5022 Chronic systolic (congestive) heart failure: Secondary | ICD-10-CM | POA: Insufficient documentation

## 2021-02-27 DIAGNOSIS — I70219 Atherosclerosis of native arteries of extremities with intermittent claudication, unspecified extremity: Secondary | ICD-10-CM

## 2021-02-27 DIAGNOSIS — Z7951 Long term (current) use of inhaled steroids: Secondary | ICD-10-CM | POA: Insufficient documentation

## 2021-02-27 DIAGNOSIS — E1151 Type 2 diabetes mellitus with diabetic peripheral angiopathy without gangrene: Secondary | ICD-10-CM | POA: Diagnosis not present

## 2021-02-27 DIAGNOSIS — G4733 Obstructive sleep apnea (adult) (pediatric): Secondary | ICD-10-CM | POA: Diagnosis not present

## 2021-02-27 DIAGNOSIS — Z7984 Long term (current) use of oral hypoglycemic drugs: Secondary | ICD-10-CM | POA: Insufficient documentation

## 2021-02-27 DIAGNOSIS — I10 Essential (primary) hypertension: Secondary | ICD-10-CM | POA: Diagnosis not present

## 2021-02-27 DIAGNOSIS — E785 Hyperlipidemia, unspecified: Secondary | ICD-10-CM | POA: Insufficient documentation

## 2021-02-27 DIAGNOSIS — F32A Depression, unspecified: Secondary | ICD-10-CM | POA: Insufficient documentation

## 2021-02-27 DIAGNOSIS — E1159 Type 2 diabetes mellitus with other circulatory complications: Secondary | ICD-10-CM

## 2021-02-27 DIAGNOSIS — Z89511 Acquired absence of right leg below knee: Secondary | ICD-10-CM | POA: Insufficient documentation

## 2021-02-27 DIAGNOSIS — I2510 Atherosclerotic heart disease of native coronary artery without angina pectoris: Secondary | ICD-10-CM | POA: Diagnosis not present

## 2021-02-27 DIAGNOSIS — Z7902 Long term (current) use of antithrombotics/antiplatelets: Secondary | ICD-10-CM | POA: Insufficient documentation

## 2021-02-27 DIAGNOSIS — J449 Chronic obstructive pulmonary disease, unspecified: Secondary | ICD-10-CM | POA: Diagnosis not present

## 2021-02-27 NOTE — Progress Notes (Signed)
Patient ID: [name] [name], male    DOB: [birth year], 60 y.o.   MRN: [id number]  HPI  [name] [name] is a 59 y/o male with a history of obstructive sleep apnea, PVD, PAD, HTN, hyperlipidemia, DM, CAD, COPD, BPH, asthma, current tobacco use and chronic heart failure.   Echo report from 06/17/20 reviewed and showed an EF of <20% along with elevated PA pressure of 53.2 mmHg and mild/ moderate TR. Echo report from 01/20/20 reviewed and showed an EF of <20% along with mild [name] and moderate TR. Echo report from 09/29/19 reviewed and showed an EF of 20-25% along with moderately elevated PA pressure, mild [name] and mild/moderate TR. Echo report from 11/06/2018 reviewed and showed an EF of 20-25% along with moderate TR and a PA pressure of 59.6 mmHg. Echo report from 04/25/17 reviewed and shows an EF of 25-30% along with a PA pressure of 34 mm Hg. EF has declined from 55-60% back in 2016.   LHC done 01/20/20 showed: Prox RCA-1 lesion is 80% stenosed. Prox RCA-2 lesion is 20% stenosed. Prox RCA to Mid RCA lesion is 80% stenosed. Prox LAD lesion is 20% stenosed. 1st Diag lesion is 80% stenosed. Mid LAD lesion is 20% stenosed. Dist LAD lesion is 60% stenosed. Prox Cx to Mid Cx lesion is 90% stenosed. LPAV lesion is 95% stenosed.   1. Significant underlying three-vessel coronary artery disease. Patent proximal LAD stent with mild in-stent restenosis. Moderate diffuse disease in the distal LAD. Significant stenosis in the proximal left circumflex at the origin of the posterior AV groove artery which is also heavily diseased at the ostium. This is a bifurcation lesion and heavily calcified. RCA stent is patent. However, there is significant proximal disease in the whole mid to distal segment is diffusely diseased and small caliber. 2. Left ventricular angiography was not performed. EF was severe reduced by echo. 3. Moderately elevated left ventricular end-diastolic pressure at 28 mmHg  Cardiac catheterization done 04/28/17  showed significant three-vessel disease with a patient stent in the RCA and LAD. Significant proximal RCS disease and the stent as well as diffuse mid and distal disease. LAD has moderate disease. Severely elevated left ventricular end-diastolic pressure at 34 mmHg. Possible CABG in the future with optimizing medical management. Stress done in 2015.  Was in the ED  01/19/21 due to fall out of bed after feeling light-headed along with feeling short of breath. Initially given IV lasix. Critically high troponin of 299. Was hypoxic on room air into the low-mid 80's. Admission recommended but patient declined. Was in the ED 08/22/20 due to upper back and shoulder pain. Hypotensive so IVF given along with antibiotics. Symptoms improved and he was released.   He presents today for a follow-up visit with a chief complaint of moderate fatigue upon minimal exertion. He describes this as chronic in nature having been present for several years. He has associated cough, shortness of breath & depression along with this. He denies any chest pain, pedal edema, palpitations, abdominal distention, difficulty sleeping, dizziness or weight gain.   Says that his appetite has decreased and he sometimes eats a "cup of noodle soup" for his meal but doesn't add any salt to it. Drinks 4-5 twelve ounce cans of Mtn Dew daily. Says that he doesn't really like the taste of water.   Past Medical History:  Diagnosis Date   Acute on chronic heart failure ([place])    AKI (acute kidney injury) ([place]) 01/03/2018   Angina at rest [facility])  08/13/2017   Arthritis    Asthma    Atherosclerosis    BPH (benign prostatic hyperplasia)    Carotid arterial disease (HCC)    Cellulitis of foot, left 09/27/2019   Charcot's joint of foot, right    Chronic combined systolic (congestive) and diastolic (congestive) heart failure ([place])    a. 02/2013 EF 50% by LV gram; b. 04/2017 Echo: EF 25-30%. diff HK. Gr2 DD; c. 06/2020 Echo: EF <20%, glob HK. RVS 53.84mHg.  Sev BAE. Mild to mod TR.   Chronic foot pain (Secondary Area of Pain) (Right) 01/29/2018   COPD (chronic obstructive pulmonary disease) (HCC)    COPD with acute exacerbation ([place] 01/30/2020   Coronary artery disease    a. 2013 S/P PCI of LAD ([facility];  b. 03/2013 PCI: RCA 90p ( 2.5 x 23 mm DES); c. 02/2014 Cath: patent RCA stent->Med Rx; d. 04/2017 Cath: LM nl, LAd 30ost/p, 6110m, D1 80ost, LCX 90p/m, OM1 85, RCA 70/20p, 8036m0d->Referred for CT Surg-felt to be poor candidate; d. 01/2020 Cath: LM nl, LAD 20p/m, 60d. D1 80. LCX 90p/m. LPAV 95, RCA 80/20p, 80p/m.   Diabetic neuropathy ([place]  Diabetic ulcer of right foot ([place]  [place]    HFrEF (heart failure with reduced ejection fraction) ([place]8/22/2021   Hyperlipidemia    Hypertension    Ischemic cardiomyopathy    a. 02/2013 EF 50% by LV gram; b. 04/2017 Echo: EF 25-30%. diff HK. Gr2 DD; c. 06/2020 Echo: EF <20%, glob HK. RVS 53.2mm73m Sev BAE. Mild to mod TR.   Morbid obesity (HCC)[place] Neuropathy    PAD (peripheral artery disease) (HCC)[place] a. Followed by Dr. Dew;[place] 08/2010 Periph Angio: RSFA 70-80p (6X60 self-expanding stent); c. 09/2010 Periph Angio: L SFA 100p (7x unknown length self-expanding stent); d. 06/2015 Periph Angio: R SFA short segment occlusion (6x12 self-expanding stent).   Persistent atrial fibrillation (HCC)    Restless leg syndrome    Sepsis (HCC)[place]/21/2019   Sleep apnea    Subclavian artery stenosis, right (HCC)    Syncope and collapse    Tobacco use    a. 75+ yr hx - still smoking 1ppd, down from 2 ppd.   Type II diabetes mellitus (HCC)    Unstable angina (HCC)[place]/06/2016   Varicose vein    Past Surgical History:  Procedure Laterality Date   ABDOMINAL AORTAGRAM N/A 06/23/2013   Procedure: ABDOMINAL AORTMaxcine Hamurgeon: MuhaWellington Hampshire;  [facility];  Service: Cardiovascular;  Laterality: N/A;   AMPUTATION Right 04/01/2018   Procedure: AMPUTATION BELOW KNEE;  Surgeon: Dew,Algernon Huxley;  Location: [facility]  ORS;  Service: Vascular;  Laterality: Right;   AMPUTATION TOE Left 08/11/2020   Procedure: AMPUTATION TOE-2nd Toe;  Surgeon: FowlSamara DeistM;  Location: [facility] ORS;  Service: Podiatry;  Laterality: Left;   APPENDECTOMY     CARDIAC CATHE[facility]  [facility]/14   [facility]   [facility]   CLAVICLE SURGERY     HERNIA REPAIR     IRRIGATION AND DEBRIDEMENT FOOT Right 01/04/2018   Procedure: IRRIGATION AND DEBRIDEMENT FOOT;  Surgeon: FowlSamara DeistM;  Location: [facility] ORS;  Service: Podiatry;  Laterality: Right;   LEFT HEART CATH AND CORONARY ANGIOGRAPHY N/A 01/20/2020   Procedure: LEFT HEART CATH AND CORONARY ANGIOGRAPHY;  Surgeon: AridWellington Hampshire;  [facility];  Service: Cardiovascular;  Laterality: N/A;   LEFT HEART CATH AND CORS/GRAFTS ANGIOGRAPHY N/A 04/28/2017   Procedure: LEFT HEART CATH AND CORONARY ANGIOGRAPHY;  Surgeon: Wellington Hampshire, MD;  Location: [place] CV LAB;  Service: Cardiovascular;  Laterality: N/A;   LOWER EXTREMITY ANGIOGRAPHY Right 03/03/2018   Procedure: LOWER EXTREMITY ANGIOGRAPHY;  Surgeon: Katha Cabal, MD;  Location: [place] CV LAB;  Service: Cardiovascular;  Laterality: Right;   LOWER EXTREMITY ANGIOGRAPHY Left 08/24/2018   Procedure: LOWER EXTREMITY ANGIOGRAPHY;  Surgeon: Algernon Huxley, MD;  Location: [place] CV LAB;  Service: Cardiovascular;  Laterality: Left;   LOWER EXTREMITY ANGIOGRAPHY Left 06/14/2019   Procedure: LOWER EXTREMITY ANGIOGRAPHY;  Surgeon: Algernon Huxley, MD;  Location: [place] CV LAB;  Service: Cardiovascular;  Laterality: Left;   LOWER EXTREMITY ANGIOGRAPHY Left 08/02/2019   Procedure: LOWER EXTREMITY ANGIOGRAPHY;  Surgeon: Algernon Huxley, MD;  Location: [place] CV LAB;  Service: Cardiovascular;  Laterality: Left;   LOWER EXTREMITY ANGIOGRAPHY Left 07/17/2020   Procedure: LOWER EXTREMITY ANGIOGRAPHY;  Surgeon:  Algernon Huxley, MD;  Location: [place] CV LAB;  Service: Cardiovascular;  Laterality: Left;   PERIPHERAL ARTERIAL STENT GRAFT     x2 left/right   PERIPHERAL VASCULAR BALLOON ANGIOPLASTY Right 01/06/2018   Procedure: PERIPHERAL VASCULAR BALLOON ANGIOPLASTY;  Surgeon: Katha Cabal, MD;  Location: [place] CV LAB;  Service: Cardiovascular;  Laterality: Right;   PERIPHERAL VASCULAR CATHETERIZATION N/A 07/06/2015   Procedure: Abdominal Aortogram w/Lower Extremity;  Surgeon: Algernon Huxley, MD;  Location: [place] CV LAB;  Service: Cardiovascular;  Laterality: N/A;   PERIPHERAL VASCULAR CATHETERIZATION  07/06/2015   Procedure: Lower Extremity Intervention;  Surgeon: Algernon Huxley, MD;  Location: [employer] CV LAB;  Service: Cardiovascular;;   Family History  Problem Relation Age of Onset   Heart attack Father    Heart disease Father    Hypertension Father    Hyperlipidemia Father    Hypertension Mother    Hyperlipidemia Mother    Social History   Tobacco Use   Smoking status: Every Day    Packs/day: 1.00    Years: 41.00    Pack years: 41.00    Types: Cigarettes   Smokeless tobacco: Never  Substance Use Topics   Alcohol use: No   Allergies  Allergen Reactions   Dulaglutide Anaphylaxis, Diarrhea and Hives   Ace Inhibitors     hypotension   Other Itching    Skin itching associated with nitro patch   Prednisone Rash   Prior to Admission medications   Medication Sig Start Date End Date Taking? Authorizing Provider  albuterol (VENTOLIN HFA) 108 (90 Base) MCG/ACT inhaler Inhale into the lungs every 6 (six) hours as needed for wheezing or shortness of breath.   Yes [provider]  atorvastatin (LIPITOR) 80 MG tablet Take 1 tablet (80 mg total) by mouth daily. 10/09/20  Yes Minna Merritts, MD  carvedilol (COREG) 3.125 MG tablet Take 1 tablet (3.125 mg total) by mouth 2 (two) times daily with a meal. 10/09/20  Yes Gollan, Kathlene November, MD  DULoxetine (CYMBALTA) 60 MG  capsule Take 60 mg by mouth daily.   Yes [provider]  ezetimibe (ZETIA) 10 MG tablet Take 1 tablet (10 mg total) by mouth daily. 10/09/20  Yes Minna Merritts, MD  gabapentin (NEURONTIN) 800 MG tablet Take 800 mg by mouth 4 (four) times daily.  08/14/17 04/04/29 Yes [provider]  ipratropium-albuterol (  DUONEB) 0.5-2.5 (3) MG/3ML SOLN Inhale 3 mLs into the lungs every 6 (six) hours as needed (wheezing/sob).    Yes [provider]  isosorbide mononitrate (IMDUR) 30 MG 24 hr tablet Take 1 tablet (30 mg total) by mouth daily. 10/09/20  Yes Minna Merritts, MD  losartan (COZAAR) 25 MG tablet Take 0.5 tablets (12.5 mg total) by mouth daily. 10/09/20  Yes Minna Merritts, MD  metFORMIN (GLUCOPHAGE-XR) 500 MG 24 hr tablet Take 1,000 mg by mouth 2 (two) times daily. 01/02/21  Yes [provider]  metolazone (ZAROXOLYN) 2.5 MG tablet Take 1 tablet (2.5 mg total) by mouth daily as needed. Take 1/2 hour before morning torsemide 01/23/21 04/23/21 Yes Shariece Viveiros A, FNP  Multiple Vitamin (MULTI-VITAMINS) TABS Take 1 tablet by mouth daily.    Yes [provider]  nitroGLYCERIN (NITROSTAT) 0.4 MG SL tablet Place 1 tablet (0.4 mg total) under the tongue every 5 (five) minutes as needed for chest pain. 10/09/20  Yes Minna Merritts, MD  omeprazole (PRILOSEC) 40 MG capsule Take 40 mg by mouth daily.   Yes [provider]  Oxycodone HCl 10 MG TABS Take 10 mg by mouth every 6 (six) hours as needed (pain).  01/15/18  Yes [provider]  polyethylene glycol (MIRALAX / GLYCOLAX) 17 g packet Take 17 g by mouth daily as needed for mild constipation. 08/12/20  Yes Wieting, Richard, MD  potassium chloride (KLOR-CON) 10 MEQ tablet Take 1 tablet (10 mEq total) by mouth 2 (two) times daily. 10/09/20  Yes Minna Merritts, MD  spironolactone (ALDACTONE) 25 MG tablet Take 0.5 tablets (12.5 mg total) by mouth daily. 10/09/20  Yes Minna Merritts, MD  SYMBICORT 80-4.5  MCG/ACT inhaler Inhale 1 puff into the lungs 2 (two) times daily. 01/01/20  Yes [provider]  torsemide (DEMADEX) 20 MG tablet Take 40 mg by mouth daily.   Yes [provider]  traZODone (DESYREL) 150 MG tablet Take 150 mg by mouth at bedtime. 07/12/19  Yes [provider]  VERQUVO 10 MG TABS TAKE 1 TABLET BY MOUTH ONCE DAILY 11/16/20  Yes Darylene Price A, FNP  clopidogrel (PLAVIX) 75 MG tablet Take 1 tablet (75 mg total) by mouth daily. 10/09/20   Gollan, Kathlene November, MD  ELIQUIS 5 MG TABS tablet Take 5 mg by mouth 2 (two) times daily. 02/02/20   [provider]  empagliflozin (JARDIANCE) 10 MG TABS tablet Take 10 mg by mouth daily.  02/03/20   [provider]  tamsulosin (FLOMAX) 0.4 MG CAPS capsule Take 1 capsule (0.4 mg total) by mouth daily after supper. Patient not taking: Reported on 02/27/2021 07/01/20   Shawna Clamp, MD    Review of Systems  Constitutional:  Positive for fatigue (moderate). Negative for appetite change.  HENT:  Negative for congestion, rhinorrhea, sore throat and trouble swallowing.   Eyes: Negative.   Respiratory:  Positive for cough (productive) and shortness of breath (minimal).   Cardiovascular:  Negative for chest pain, palpitations and leg swelling.  Gastrointestinal:  Negative for abdominal distention and abdominal pain.  Endocrine: Negative.   Genitourinary: Negative.   Musculoskeletal:  Positive for arthralgias (right shoulder).  Skin: Negative.   Allergic/Immunologic: Negative.   Neurological:  Negative for dizziness and light-headedness.  Hematological:  Negative for adenopathy. Bruises/bleeds easily.  Psychiatric/Behavioral:  Positive for dysphoric mood. Negative for sleep disturbance (sleeping on 2 pillows with HOB elevated). The patient is not nervous/anxious.    Vitals:  02/27/21 1422  BP: (!) 109/59  Pulse: 81  Resp: 18  SpO2: 95%  Weight: 181 lb 2 oz (82.2 kg)  Height: '5\' 7"'$  (1.702 m)   Wt Readings  from Last 3 Encounters:  02/27/21 181 lb 2 oz (82.2 kg)  01/25/21 194 lb 8 oz (88.2 kg)  01/23/21 197 lb (89.4 kg)   Lab Results  Component Value Date   CREATININE 1.30 (H) 01/25/2021   CREATININE 1.38 (H) 01/19/2021   CREATININE 1.39 (H) 01/18/2021   Physical Exam Vitals and nursing note reviewed.  Constitutional:      Appearance: Normal appearance.  HENT:     Head: Normocephalic and atraumatic.  Cardiovascular:     Rate and Rhythm: Normal rate and regular rhythm.  Pulmonary:     Effort: Pulmonary effort is normal. No respiratory distress.     Breath sounds: No wheezing or rales.  Abdominal:     General: There is no distension.     Palpations: Abdomen is soft.     Tenderness: There is no abdominal tenderness.  Musculoskeletal:     Cervical back: Normal range of motion and neck supple.     Right lower leg: No edema.     Left lower leg: No edema.     Comments: Right lower leg prosthesis  Skin:    General: Skin is warm and dry.     Findings: Bruising (on arms) present.  Neurological:     General: No focal deficit present.     Mental Status: He is alert and oriented to person, place, and time.  Psychiatric:        Mood and Affect: Mood normal.        Behavior: Behavior normal.        Thought Content: Thought content normal.    Assessment & Plan:  1: Chronic heart failure with reduced ejection fraction- - NYHA class III - euvolemic today - weighing daily; reminded to call for an overnight weight gain of >2 pounds or a weekly weight gain of >5 pounds - weight down 16 pounds from last visit 1 month ago; reports decreased appetite; easing soup and he is aware of sodium content of the soup; reminded to keep daily sodium intake to < '2000mg'$  - encouraged him to decrease his Mtn Dew consumption and try to find flavored water that he would like - saw cardiology Sharolyn Douglas) 01/25/21 - saw pulmonology Raul Del) 10/18/20 - palliative care visit done 08/03/20 - BNP 01/19/21 was 1964.1 -  participating in paramedicine program  - dig level 01/25/21 was 0.8  2: HTN- - BP looks good today - saw PCP Jodi Mourning) 12/27/20 - BMP done 01/25/21 reviewed and shows sodium 140, potassium 4.4, creatinine 1.3 and GFR 64  3: Tobacco use- - currently smoking ~ 3 cigarettes daily - complete cessation discussed for 3 minutes with him  4: Severe atherosclerosis of lower extremities- - had left lower leg angiogram 07/17/20 - saw vascular Owens Shark) 01/10/21 - 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   Return in 3 months or sooner for any questions/problems before then.

## 2021-02-27 NOTE — Patient Instructions (Signed)
Continue weighing daily and call for an overnight weight gain of > 2 pounds or a weekly weight gain of >5 pounds. 

## 2021-03-07 NOTE — Progress Notes (Signed)
Date:  03/09/2021   ID:  [name] [name], DOB [birth year], MRN [id number]  Patient Location:  [place] [id number]   Provider location:   [facility], [place] office  PCP:  [name], MD  Cardiologist:  Arvid Right [facility]   Chief Complaint  Patient presents with   1 month follow up     Patient c/o shortness of breath, neck & shoulder pain, chest pain, LE edema as well as abdominal swelling with not having a bowel movement in 3 days. Medications reviewed by the patient verbally.      History of Present Illness:    [name] [name] is a 60 y.o. 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 Hypotensive so IVF given along with antibiotics. Symptoms improved and he was released.   ED  01/19/21  fall out of bed after feeling light-headed along with feeling short of breath.  Initially given IV lasix. Critically high troponin of 299. Was hypoxic on room air into the low-mid 80's. Admission recommended but patient declined.   Drinks 4-5 twelve ounce cans of Mtn Dew daily.  Smokes 1 pack/day  Echocardiogram January 2022  1. Left ventricular ejection fraction, by estimation, is <20%. Left  ventricular ejection fraction by PLAX is 14 %. The left ventricle has  severely decreased function. The left ventricle demonstrates global   hypokinesis. The left ventricular internal  cavity size was severely dilated. Left ventricular diastolic parameters  are indeterminate.   2. Right ventricular systolic function is severely reduced. The right  ventricular size is moderately enlarged. There is severely elevated  pulmonary artery systolic pressure. The estimated right ventricular  systolic pressure is [id number] mmHg.   3. Left atrial size was severely dilated.   4. Right atrial size was severely dilated.   5. Tricuspid valve regurgitation is mild to moderate.   6. The inferior vena cava is dilated in size with <50% respiratory  variability, suggesting right atrial pressure of 15 mmHg.    angiogram lower extremities with Dr. Lucky Cowboy July 17, 2020 Reports he has Unna boots in place for nonhealing wounds  EKG personally reviewed by myself on todays visit Shows atrial fibrillation rate 99 bpm right bundle branch block  Prior history reviewed with him  hospital 01/2020, NSTEMI, atrial fib Markedly elevated troponin Hypotensive,  echocardiogram confirming ejection fraction less than 20% with global hypokinesis, Severe PAD Cardiac catheterization showing severe three-vessel disease, patent proximal LAD stent, moderate distal LAD disease Significant stenosis proximal left circumflex, bifurcation lesion, heavily calcified, RCA stent patent, proximal RCA disease It was felt his cardiomyopathy is out of proportion to his coronary disease Management of his atrial fibrillation and heart failure symptoms felt to be more of a priority It was recommended he have pressors and diuresis Before this could be  initiated, he left the hospital [facility] January 20, 2020  Admitted to [facility] 01/29/2020 Treated for CHF, left AMA August 25  Presentation to the hospital May 2020 acute on chronic systolic CHF Treated with Lasix IV, transition to torsemide Toprol XL and spironolactone   with continued -ACEi has previously been held secondary to relative  hypotension  LE stent placed 08/2018 Percutaneous transluminal angioplasty of the entire left SFA and above-knee popliteal artery with a 5 mm diameter by 30 cm length and a 5 mm diameter by 22 cm length Lutonix drug-coated angioplasty balloon  Prior CV studies:   The following studies were reviewed today:  2D Echo 11/06/2018: 1. The left ventricle has severely reduced systolic function, with an ejection fraction of 20-25%. The cavity size was moderately dilated. Indeterminate diastolic filling due to E-A fusion. Left ventricular diffuse hypokinesis.  2. The right ventricle has severely reduced systolic function. The cavity was mildly enlarged. There is no increase in right ventricular wall thickness. Right ventricular systolic pressure is moderately elevated with an estimated pressure of 59.6 mmHg.   Past Medical History:  Diagnosis Date   Acute on chronic heart failure ([place])    AKI (acute kidney injury) ([place]) 01/03/2018   Angina at rest [facility]) 08/13/2017   Arthritis    Asthma    Atherosclerosis    BPH (benign prostatic hyperplasia)    Carotid arterial disease ([place])    Cellulitis of foot, left 09/27/2019   Charcot's joint of foot, right    Chronic combined systolic (congestive) and diastolic (congestive) heart failure ([place])    a. 02/2013 EF 50% by LV gram; b. 04/2017 Echo: EF 25-30%. diff HK. Gr2 DD; c. 06/2020 Echo: EF <20%, glob HK. RVS 53.58mHg. Sev BAE. Mild to mod TR.   Chronic foot pain (Secondary Area of Pain) (Right) 01/29/2018   COPD (chronic obstructive pulmonary disease) (HCC)    COPD with acute exacerbation ([place] 01/30/2020   Coronary artery disease    a. 2013 S/P PCI of LAD ([facility];  b. 03/2013 PCI: RCA 90p ( 2.5 x 23 mm DES); c. 02/2014 Cath: patent RCA stent->Med Rx; d. 04/2017 Cath: LM nl, LAd 30ost/p, 688m, D1 80ost, LCX 90p/m, OM1 85, RCA 70/20p, 8052m0d->Referred for CT Surg-felt to be poor candidate; d. 01/2020 Cath: LM nl, LAD 20p/m, 60d. D1 80. LCX 90p/m. LPAV 95, RCA  80/20p, 80p/m.   Diabetic neuropathy ([place]  Diabetic ulcer of right foot ([place]  [place]    HFrEF (heart failure with reduced ejection fraction) ([place]CC[place]8/22/2021   Hyperlipidemia    Hypertension    Ischemic cardiomyopathy    a. 02/2013 EF 50% by LV gram; b. 04/2017 Echo: EF 25-30%. diff HK. Gr2 DD; c. 06/2020 Echo: EF <20%, glob HK. RVS 53.2mm60m Sev BAE. Mild to mod TR.   Morbid obesity (HCC)[place] Neuropathy    PAD (peripheral artery disease) (HCC)[place] a. Followed by Dr. Dew;Lucky Cowboy 08/2010 Periph Angio: RSFA 70-80p (6X60 self-expanding stent); c. 09/2010 Periph Angio: L SFA 100p (7x unknown length self-expanding stent); d. 06/2015 Periph Angio: R SFA short segment occlusion (6x12 self-expanding stent).   Persistent atrial fibrillation (HCC)    Restless leg syndrome    Sepsis (HCC)[place]/21/2019   Sleep apnea    Subclavian artery stenosis, right (HCC)    Syncope and collapse    Tobacco use    a. 75+ yr hx - still smoking 1ppd, down from 2 ppd.   Type  II diabetes mellitus ([place])    Unstable angina ([place]) 07/11/2016   Varicose vein    Past Surgical History:  Procedure Laterality Date   ABDOMINAL AORTAGRAM N/A 06/23/2013   Procedure: ABDOMINAL Maxcine Ham;  Surgeon: Wellington Hampshire, MD;  Location: [place] CATH LAB;  Service: Cardiovascular;  Laterality: N/A;   AMPUTATION Right 04/01/2018   Procedure: AMPUTATION BELOW KNEE;  Surgeon: Algernon Huxley, MD;  Location: [facility] ORS;  Service: Vascular;  Laterality: Right;   AMPUTATION TOE Left 08/11/2020   Procedure: AMPUTATION TOE-2nd Toe;  Surgeon: Samara Deist, DPM;  Location: [facility] ORS;  Service: Podiatry;  Laterality: Left;   APPENDECTOMY     CARDIAC CATHETERIZATION  10/14   [place]; X1 STENT PROXIMAL RCA   CARDIAC CATHETERIZATION  02/2010   [facility]   CARDIAC [facility]  [facility]/2014   [facility]   CLAVICLE SURGERY     HERNIA REPAIR     IRRIGATION AND DEBRIDEMENT FOOT Right 01/04/2018   Procedure: IRRIGATION AND DEBRIDEMENT FOOT;  Surgeon: Samara Deist, DPM;   Location: [facility] ORS;  Service: Podiatry;  Laterality: Right;   LEFT HEART CATH AND CORONARY ANGIOGRAPHY N/A 01/20/2020   Procedure: LEFT HEART CATH AND CORONARY ANGIOGRAPHY;  Surgeon: Wellington Hampshire, MD;  Location: [place] CV LAB;  Service: Cardiovascular;  Laterality: N/A;   LEFT HEART CATH AND CORS/GRAFTS ANGIOGRAPHY N/A 04/28/2017   Procedure: LEFT HEART CATH AND CORONARY ANGIOGRAPHY;  Surgeon: Wellington Hampshire, MD;  Location: [place] CV LAB;  Service: Cardiovascular;  Laterality: N/A;   LOWER EXTREMITY ANGIOGRAPHY Right 03/03/2018   Procedure: LOWER EXTREMITY ANGIOGRAPHY;  Surgeon: Katha Cabal, MD;  Location: [place] CV LAB;  Service: Cardiovascular;  Laterality: Right;   LOWER EXTREMITY ANGIOGRAPHY Left 08/24/2018   Procedure: LOWER EXTREMITY ANGIOGRAPHY;  Surgeon: Algernon Huxley, MD;  Location: [place] CV LAB;  Service: Cardiovascular;  Laterality: Left;   LOWER EXTREMITY ANGIOGRAPHY Left 06/14/2019   Procedure: LOWER EXTREMITY ANGIOGRAPHY;  Surgeon: Algernon Huxley, MD;  Location: [place] CV LAB;  Service: Cardiovascular;  Laterality: Left;   LOWER EXTREMITY ANGIOGRAPHY Left 08/02/2019   Procedure: LOWER EXTREMITY ANGIOGRAPHY;  Surgeon: Algernon Huxley, MD;  Location: [place] CV LAB;  Service: Cardiovascular;  Laterality: Left;   LOWER EXTREMITY ANGIOGRAPHY Left 07/17/2020   Procedure: LOWER EXTREMITY ANGIOGRAPHY;  Surgeon: Algernon Huxley, MD;  Location: [place] CV LAB;  Service: Cardiovascular;  Laterality: Left;   PERIPHERAL ARTERIAL STENT GRAFT     x2 left/right   PERIPHERAL VASCULAR BALLOON ANGIOPLASTY Right 01/06/2018   Procedure: PERIPHERAL VASCULAR BALLOON ANGIOPLASTY;  Surgeon: Katha Cabal, MD;  Location: [place] CV LAB;  Service: Cardiovascular;  Laterality: Right;   PERIPHERAL VASCULAR CATHETERIZATION N/A 07/06/2015   Procedure: Abdominal Aortogram w/Lower Extremity;  Surgeon: Algernon Huxley, MD;  Location: [place] CV LAB;  Service:  Cardiovascular;  Laterality: N/A;   PERIPHERAL VASCULAR CATHETERIZATION  07/06/2015   Procedure: Lower Extremity Intervention;  Surgeon: Algernon Huxley, MD;  Location: [place] CV LAB;  Service: Cardiovascular;;     Current Meds  Medication Sig   albuterol (VENTOLIN HFA) 108 (90 Base) MCG/ACT inhaler Inhale into the lungs every 6 (six) hours as needed for wheezing or shortness of breath.   atorvastatin (LIPITOR) 80 MG tablet Take 1 tablet (80 mg total) by mouth daily.   carvedilol (COREG) 3.125 MG tablet Take 1 tablet (3.125 mg total) by mouth 2 (two) times daily with a meal.   clopidogrel (PLAVIX) 75 MG  tablet Take 1 tablet (75 mg total) by mouth daily.   DULoxetine (CYMBALTA) 60 MG capsule Take 60 mg by mouth daily.   ELIQUIS 5 MG TABS tablet Take 5 mg by mouth 2 (two) times daily.   empagliflozin (JARDIANCE) 10 MG TABS tablet Take 10 mg by mouth daily.    ezetimibe (ZETIA) 10 MG tablet Take 1 tablet (10 mg total) by mouth daily.   gabapentin (NEURONTIN) 800 MG tablet Take 800 mg by mouth 4 (four) times daily.    ipratropium-albuterol (DUONEB) 0.5-2.5 (3) MG/3ML SOLN Inhale 3 mLs into the lungs every 6 (six) hours as needed (wheezing/sob).    isosorbide mononitrate (IMDUR) 30 MG 24 hr tablet Take 1 tablet (30 mg total) by mouth daily.   losartan (COZAAR) 25 MG tablet Take 0.5 tablets (12.5 mg total) by mouth daily.   metFORMIN (GLUCOPHAGE-XR) 500 MG 24 hr tablet Take 1,000 mg by mouth 2 (two) times daily.   metolazone (ZAROXOLYN) 2.5 MG tablet Take 1 tablet (2.5 mg total) by mouth daily as needed. Take 1/2 hour before morning torsemide   Multiple Vitamin (MULTI-VITAMINS) TABS Take 1 tablet by mouth daily.    nitroGLYCERIN (NITROSTAT) 0.4 MG SL tablet Place 1 tablet (0.4 mg total) under the tongue every 5 (five) minutes as needed for chest pain.   omeprazole (PRILOSEC) 40 MG capsule Take 40 mg by mouth daily.   Oxycodone HCl 10 MG TABS Take 10 mg by mouth every 6 (six) hours as needed  (pain).    polyethylene glycol (MIRALAX / GLYCOLAX) 17 g packet Take 17 g by mouth daily as needed for mild constipation.   potassium chloride (KLOR-CON) 10 MEQ tablet Take 1 tablet (10 mEq total) by mouth 2 (two) times daily.   spironolactone (ALDACTONE) 25 MG tablet Take 0.5 tablets (12.5 mg total) by mouth daily.   SYMBICORT 80-4.5 MCG/ACT inhaler Inhale 1 puff into the lungs 2 (two) times daily.   tamsulosin (FLOMAX) 0.4 MG CAPS capsule Take 1 capsule (0.4 mg total) by mouth daily after supper.   torsemide (DEMADEX) 20 MG tablet Take 40 mg by mouth daily.   traZODone (DESYREL) 150 MG tablet Take 150 mg by mouth at bedtime.   TRELEGY ELLIPTA 100-62.5-25 MCG/INH AEPB Inhale 1 puff into the lungs daily.   VERQUVO 10 MG TABS TAKE 1 TABLET BY MOUTH ONCE DAILY     Allergies:   Dulaglutide, Ace inhibitors, Other, and Prednisone   Social History   Tobacco Use   Smoking status: Every Day    Packs/day: 1.00    Years: 41.00    Pack years: 41.00    Types: Cigarettes   Smokeless tobacco: Never  Vaping Use   Vaping Use: Never used  Substance Use Topics   Alcohol use: No   Drug use: No     Family Hx: The patient's family history includes Heart attack in his father; Heart disease in his father; Hyperlipidemia in his father and mother; Hypertension in his father and mother.  ROS:   Please see the history of present illness.    Review of Systems  Constitutional: Negative.   HENT: Negative.    Respiratory:  Positive for shortness of breath.   Cardiovascular:  Positive for chest pain.  Gastrointestinal: Negative.   Musculoskeletal: Negative.   Neurological: Negative.   Psychiatric/Behavioral: Negative.    All other systems reviewed and are negative.   Labs/Other Tests and Data Reviewed:    Recent Labs: 08/07/2020: TSH 1.573 08/22/2020: ALT 13 01/19/2021:  B Natriuretic Peptide 1,964.1; Hemoglobin 11.4; Platelets 151 01/25/2021: BUN 18; Creatinine, Ser 1.30; Magnesium 1.7; Potassium  4.4; Sodium 140   Recent Lipid Panel Lab Results  Component Value Date/Time   CHOL 118 01/20/2020 03:52 AM   CHOL 211 (H) 04/05/2013 05:20 AM   TRIG 37 01/20/2020 03:52 AM   TRIG 321 (H) 04/05/2013 05:20 AM   HDL 34 (L) 01/20/2020 03:52 AM   HDL 34 (L) 04/05/2013 05:20 AM   CHOLHDL 3.5 01/20/2020 03:52 AM   LDLCALC 77 01/20/2020 03:52 AM   LDLCALC 113 (H) 04/05/2013 05:20 AM    Wt Readings from Last 3 Encounters:  03/09/21 197 lb 8 oz (89.6 kg)  02/27/21 181 lb 2 oz (82.2 kg)  01/25/21 194 lb 8 oz (88.2 kg)     Exam:    Vital Signs: Vital signs may also be detailed in the HPI BP 90/60 (BP Location: Left Arm, Patient Position: Sitting, Cuff Size: Normal)   Pulse 99   Ht '5\' 7"'$  (1.702 m)   Wt 197 lb 8 oz (89.6 kg)   SpO2 95%   BMI 30.93 kg/m  Constitutional:  oriented to person, place, and time. No distress.  HENT:  Head: Grossly normal Eyes:  no discharge. No scleral icterus.  Neck: No JVD, no carotid bruits  Cardiovascular: Regular rate and rhythm, no murmurs appreciated Pulmonary/Chest: Clear to auscultation bilaterally, no wheezes or rails Abdominal: Soft.  no distension.  no tenderness.  Musculoskeletal: Normal range of motion Neurological:  normal muscle tone. Coordination normal. No atrophy Skin: Skin warm and dry Psychiatric: normal affect, pleasant   ASSESSMENT & PLAN:    Chronic systolic HF (heart failure) (HCC) - No medication changes made Reports he is taking torsemide 40 in the morning with 20 in the afternoon, Other medications continued Has been doing well, wife gives him his medications Reports he is relatively asymptomatic from low blood pressure Periodically follows up in CHF clinic, has per medicine This aggressive intervention has kept him out of the hospital  Coronary artery disease of native artery of native heart with stable angina pectoris ([place]) - Previously declined CABG Prior catheterization, left AMA  Was at [facility], also left AMA No  further ischemic work-up given poor compliance in the past Denies chest pain concerning for angina, high risk of cardiac arrest He is aware, palliative care has seen him in the past  Type 2 diabetes mellitus with foot ulcer, with long-term current use of insulin ([place])  Followed by vascular, amputation on the right High risk of further disease given continued smoking Smoking cessation recommended A1c improved  Persistent atrial fibrillation Adequate rate, on anticoagulation Not a candidate to restore normal sinus rhythm Denies falls  Atherosclerosis of artery of extremity with ulceration (HCC) -  Followed by vascular, prior stent left SFA Amputation on the right Still smoking, cessation recommended Stable  PAD (peripheral artery disease) ([place]) -  smoking cessation discussed Has severe disease  Mixed hyperlipidemia -  Last cholesterol at goal   Total encounter time more than 25 minutes  Greater than 50% was spent in counseling and coordination of care with the patient   Signed, Ida Rogue, MD  03/09/2021 2:52 PM    [id number], [place], [place] [address]

## 2021-03-09 ENCOUNTER — Other Ambulatory Visit: Payer: Self-pay

## 2021-03-09 ENCOUNTER — Ambulatory Visit (INDEPENDENT_AMBULATORY_CARE_PROVIDER_SITE_OTHER): Payer: Medicare HMO | Admitting: Cardiovascular Disease

## 2021-03-09 ENCOUNTER — Encounter: Payer: Self-pay | Admitting: Cardiovascular Disease

## 2021-03-09 VITALS — BP 90/60 | HR 99 | Ht 67.0 in | Wt 197.5 lb

## 2021-03-09 DIAGNOSIS — I70219 Atherosclerosis of native arteries of extremities with intermittent claudication, unspecified extremity: Secondary | ICD-10-CM | POA: Diagnosis not present

## 2021-03-09 DIAGNOSIS — E1159 Type 2 diabetes mellitus with other circulatory complications: Secondary | ICD-10-CM

## 2021-03-09 DIAGNOSIS — N183 Chronic kidney disease, stage 3 unspecified: Secondary | ICD-10-CM

## 2021-03-09 DIAGNOSIS — I5022 Chronic systolic (congestive) heart failure: Secondary | ICD-10-CM | POA: Diagnosis not present

## 2021-03-09 DIAGNOSIS — I10 Essential (primary) hypertension: Secondary | ICD-10-CM | POA: Diagnosis not present

## 2021-03-09 DIAGNOSIS — I25118 Atherosclerotic heart disease of native coronary artery with other forms of angina pectoris: Secondary | ICD-10-CM

## 2021-03-09 DIAGNOSIS — I4821 Permanent atrial fibrillation: Secondary | ICD-10-CM

## 2021-03-09 DIAGNOSIS — Z720 Tobacco use: Secondary | ICD-10-CM

## 2021-03-09 DIAGNOSIS — I255 Ischemic cardiomyopathy: Secondary | ICD-10-CM

## 2021-03-09 NOTE — Patient Instructions (Addendum)
Medication Instructions:  No changes  If you need a refill on your cardiac medications before your next appointment, please call your pharmacy.    Lab work: No new labs needed   If you have labs (blood work) drawn today and your tests are completely normal, you will receive your results only by: [place] (if you have MyChart) OR A paper copy in the mail If you have any lab test that is abnormal or we need to change your treatment, we will call you to review the results.   Testing/Procedures:  1) Echo for cardiomyopathy  Your physician has requested that you have an echocardiogram. Echocardiography is a painless test that uses sound waves to create images of your heart. It provides your doctor with information about the size and shape of your heart and how well your heart's chambers and valves are working. This procedure takes approximately one hour. There are no restrictions for this procedure. There is a possibility that an IV may need to be started during your test to inject an image enhancing agent. This is done to obtain more optimal pictures of your heart. Therefore we ask that you do at least drink some water prior to coming in to hydrate your veins.     Follow-Up: At [facility], you and your health needs are our priority.  As part of our continuing mission to provide you with exceptional heart care, we have created designated Provider Care Teams.  These Care Teams include your primary Cardiologist (physician) and Advanced Practice Providers (APPs -  Physician Assistants and Nurse Practitioners) who all work together to provide you with the care you need, when you need it.  You will need a follow up appointment in 6 months  Providers on your designated Care Team:   Murray Hodgkins, NP Christell Faith, PA-C Marrianne Mood, PA-C Cadence Kathlen Mody, [place]  Any Other Special Instructions Will Be Listed Below (If Applicable).  COVID-19 Vaccine Information can be found at:  ShippingScam.co.uk For questions related to vaccine distribution or appointments, please email [email or web address] or call [phone].

## 2021-03-15 ENCOUNTER — Other Ambulatory Visit (INDEPENDENT_AMBULATORY_CARE_PROVIDER_SITE_OTHER): Payer: Self-pay | Admitting: Nurse Practitioner

## 2021-03-15 ENCOUNTER — Telehealth (INDEPENDENT_AMBULATORY_CARE_PROVIDER_SITE_OTHER): Payer: Self-pay

## 2021-03-15 MED ORDER — CEPHALEXIN 500 MG PO CAPS: 500.0000 mg | ORAL_CAPSULE | Freq: Three times a day (TID) | ORAL | 0 refills | Status: DC

## 2021-03-15 NOTE — Telephone Encounter (Signed)
Pt called and left a VM on the nurses line saying that his leg is swollen an red. This is not out of the normal  however the pt is requesting an antibiotic the leg is not warm to the touch the pt says that he has been elevating his leg  the made the pt aware that we could bring him in for an OFC visit to address his leg. Please  advise.

## 2021-03-16 ENCOUNTER — Other Ambulatory Visit: Payer: Self-pay | Admitting: Cardiovascular Disease

## 2021-03-18 ENCOUNTER — Inpatient Hospital Stay
Admission: EM | Admit: 2021-03-18 | Discharge: 2021-03-21 | DRG: 603 | Disposition: A | Discharge status: Home / Self Care | Payer: Medicare Other | Attending: Family Medicine | Admitting: Family Medicine

## 2021-03-18 ENCOUNTER — Emergency Department: Payer: Medicare Other

## 2021-03-18 ENCOUNTER — Other Ambulatory Visit: Payer: Self-pay

## 2021-03-18 DIAGNOSIS — E785 Hyperlipidemia, unspecified: Secondary | ICD-10-CM | POA: Diagnosis present

## 2021-03-18 DIAGNOSIS — E222 Syndrome of inappropriate secretion of antidiuretic hormone: Secondary | ICD-10-CM | POA: Diagnosis present

## 2021-03-18 DIAGNOSIS — R7989 Other specified abnormal findings of blood chemistry: Secondary | ICD-10-CM | POA: Diagnosis present

## 2021-03-18 DIAGNOSIS — I2510 Atherosclerotic heart disease of native coronary artery without angina pectoris: Secondary | ICD-10-CM | POA: Diagnosis present

## 2021-03-18 DIAGNOSIS — K219 Gastro-esophageal reflux disease without esophagitis: Secondary | ICD-10-CM | POA: Diagnosis present

## 2021-03-18 DIAGNOSIS — R197 Diarrhea, unspecified: Secondary | ICD-10-CM | POA: Diagnosis present

## 2021-03-18 DIAGNOSIS — Z8249 Family history of ischemic heart disease and other diseases of the circulatory system: Secondary | ICD-10-CM

## 2021-03-18 DIAGNOSIS — I10 Essential (primary) hypertension: Secondary | ICD-10-CM | POA: Diagnosis not present

## 2021-03-18 DIAGNOSIS — R079 Chest pain, unspecified: Secondary | ICD-10-CM | POA: Diagnosis not present

## 2021-03-18 DIAGNOSIS — I959 Hypotension, unspecified: Secondary | ICD-10-CM | POA: Diagnosis present

## 2021-03-18 DIAGNOSIS — I5022 Chronic systolic (congestive) heart failure: Secondary | ICD-10-CM | POA: Diagnosis not present

## 2021-03-18 DIAGNOSIS — I130 Hypertensive heart and chronic kidney disease with heart failure and stage 1 through stage 4 chronic kidney disease, or unspecified chronic kidney disease: Secondary | ICD-10-CM | POA: Diagnosis present

## 2021-03-18 DIAGNOSIS — I83029 Varicose veins of left lower extremity with ulcer of unspecified site: Secondary | ICD-10-CM

## 2021-03-18 DIAGNOSIS — Z79899 Other long term (current) drug therapy: Secondary | ICD-10-CM

## 2021-03-18 DIAGNOSIS — I739 Peripheral vascular disease, unspecified: Secondary | ICD-10-CM | POA: Diagnosis not present

## 2021-03-18 DIAGNOSIS — I5042 Chronic combined systolic (congestive) and diastolic (congestive) heart failure: Secondary | ICD-10-CM | POA: Diagnosis present

## 2021-03-18 DIAGNOSIS — J441 Chronic obstructive pulmonary disease with (acute) exacerbation: Secondary | ICD-10-CM

## 2021-03-18 DIAGNOSIS — E1169 Type 2 diabetes mellitus with other specified complication: Secondary | ICD-10-CM | POA: Diagnosis present

## 2021-03-18 DIAGNOSIS — Z20822 Contact with and (suspected) exposure to covid-19: Secondary | ICD-10-CM | POA: Diagnosis present

## 2021-03-18 DIAGNOSIS — Z7901 Long term (current) use of anticoagulants: Secondary | ICD-10-CM

## 2021-03-18 DIAGNOSIS — E871 Hypo-osmolality and hyponatremia: Secondary | ICD-10-CM

## 2021-03-18 DIAGNOSIS — I248 Other forms of acute ischemic heart disease: Secondary | ICD-10-CM | POA: Diagnosis present

## 2021-03-18 DIAGNOSIS — I482 Chronic atrial fibrillation, unspecified: Secondary | ICD-10-CM | POA: Diagnosis not present

## 2021-03-18 DIAGNOSIS — I4819 Other persistent atrial fibrillation: Secondary | ICD-10-CM | POA: Diagnosis present

## 2021-03-18 DIAGNOSIS — E1151 Type 2 diabetes mellitus with diabetic peripheral angiopathy without gangrene: Secondary | ICD-10-CM | POA: Diagnosis present

## 2021-03-18 DIAGNOSIS — Z7902 Long term (current) use of antithrombotics/antiplatelets: Secondary | ICD-10-CM

## 2021-03-18 DIAGNOSIS — R778 Other specified abnormalities of plasma proteins: Secondary | ICD-10-CM | POA: Diagnosis present

## 2021-03-18 DIAGNOSIS — E1142 Type 2 diabetes mellitus with diabetic polyneuropathy: Secondary | ICD-10-CM | POA: Diagnosis present

## 2021-03-18 DIAGNOSIS — G4733 Obstructive sleep apnea (adult) (pediatric): Secondary | ICD-10-CM | POA: Diagnosis present

## 2021-03-18 DIAGNOSIS — N179 Acute kidney failure, unspecified: Secondary | ICD-10-CM

## 2021-03-18 DIAGNOSIS — L97929 Non-pressure chronic ulcer of unspecified part of left lower leg with unspecified severity: Secondary | ICD-10-CM | POA: Diagnosis present

## 2021-03-18 DIAGNOSIS — Z7951 Long term (current) use of inhaled steroids: Secondary | ICD-10-CM

## 2021-03-18 DIAGNOSIS — E1122 Type 2 diabetes mellitus with diabetic chronic kidney disease: Secondary | ICD-10-CM | POA: Diagnosis present

## 2021-03-18 DIAGNOSIS — L03116 Cellulitis of left lower limb: Principal | ICD-10-CM | POA: Diagnosis present

## 2021-03-18 DIAGNOSIS — Z720 Tobacco use: Secondary | ICD-10-CM | POA: Diagnosis not present

## 2021-03-18 DIAGNOSIS — N1831 Chronic kidney disease, stage 3a: Secondary | ICD-10-CM | POA: Diagnosis present

## 2021-03-18 DIAGNOSIS — Z955 Presence of coronary angioplasty implant and graft: Secondary | ICD-10-CM

## 2021-03-18 DIAGNOSIS — J432 Centrilobular emphysema: Secondary | ICD-10-CM | POA: Diagnosis not present

## 2021-03-18 DIAGNOSIS — F32A Depression, unspecified: Secondary | ICD-10-CM | POA: Diagnosis present

## 2021-03-18 DIAGNOSIS — I255 Ischemic cardiomyopathy: Secondary | ICD-10-CM | POA: Diagnosis present

## 2021-03-18 DIAGNOSIS — Z888 Allergy status to other drugs, medicaments and biological substances status: Secondary | ICD-10-CM | POA: Diagnosis not present

## 2021-03-18 DIAGNOSIS — Z89422 Acquired absence of other left toe(s): Secondary | ICD-10-CM

## 2021-03-18 DIAGNOSIS — N400 Enlarged prostate without lower urinary tract symptoms: Secondary | ICD-10-CM | POA: Insufficient documentation

## 2021-03-18 DIAGNOSIS — D696 Thrombocytopenia, unspecified: Secondary | ICD-10-CM

## 2021-03-18 DIAGNOSIS — F17210 Nicotine dependence, cigarettes, uncomplicated: Secondary | ICD-10-CM | POA: Diagnosis present

## 2021-03-18 DIAGNOSIS — E875 Hyperkalemia: Secondary | ICD-10-CM | POA: Diagnosis present

## 2021-03-18 DIAGNOSIS — Z89511 Acquired absence of right leg below knee: Secondary | ICD-10-CM

## 2021-03-18 DIAGNOSIS — I9589 Other hypotension: Secondary | ICD-10-CM | POA: Diagnosis not present

## 2021-03-18 DIAGNOSIS — N183 Chronic kidney disease, stage 3 unspecified: Secondary | ICD-10-CM | POA: Diagnosis present

## 2021-03-18 DIAGNOSIS — J449 Chronic obstructive pulmonary disease, unspecified: Secondary | ICD-10-CM | POA: Diagnosis present

## 2021-03-18 DIAGNOSIS — Z83438 Family history of other disorder of lipoprotein metabolism and other lipidemia: Secondary | ICD-10-CM

## 2021-03-18 DIAGNOSIS — Z7984 Long term (current) use of oral hypoglycemic drugs: Secondary | ICD-10-CM

## 2021-03-18 LAB — CBC
HCT: 32.6 % — ABNORMAL LOW (ref 39.0–52.0)
Hemoglobin: 11.2 g/dL — ABNORMAL LOW (ref 13.0–17.0)
MCH: 28 pg (ref 26.0–34.0)
MCHC: 34.4 g/dL (ref 30.0–36.0)
MCV: 81.5 fL (ref 80.0–100.0)
Platelets: 164 10*3/uL (ref 150–400)
RBC: 4 MIL/uL — ABNORMAL LOW (ref 4.22–5.81)
RDW: 21.6 % — ABNORMAL HIGH (ref 11.5–15.5)
WBC: 5.4 10*3/uL (ref 4.0–10.5)
nRBC: 0 % (ref 0.0–0.2)

## 2021-03-18 LAB — RESP PANEL BY RT-PCR (FLU A&B, COVID) ARPGX2
Influenza A by PCR: NEGATIVE
Influenza B by PCR: NEGATIVE
SARS Coronavirus 2 by RT PCR: NEGATIVE

## 2021-03-18 LAB — BASIC METABOLIC PANEL
Anion gap: 10 (ref 5–15)
BUN: 23 mg/dL — ABNORMAL HIGH (ref 6–20)
CO2: 31 mmol/L (ref 22–32)
Calcium: 8.5 mg/dL — ABNORMAL LOW (ref 8.9–10.3)
Chloride: 89 mmol/L — ABNORMAL LOW (ref 98–111)
Creatinine, Ser: 1.47 mg/dL — ABNORMAL HIGH (ref 0.61–1.24)
GFR, Estimated: 55 mL/min — ABNORMAL LOW (ref >60)
Glucose, Bld: 90 mg/dL (ref 70–99)
Potassium: 4.2 mmol/L (ref 3.5–5.1)
Sodium: 130 mmol/L — ABNORMAL LOW (ref 135–145)

## 2021-03-18 LAB — MAGNESIUM: Magnesium: 1.3 mg/dL — ABNORMAL LOW (ref 1.7–2.4)

## 2021-03-18 LAB — GLUCOSE, CAPILLARY
Glucose-Capillary: 116 mg/dL — ABNORMAL HIGH (ref 70–99)
Glucose-Capillary: 172 mg/dL — ABNORMAL HIGH (ref 70–99)

## 2021-03-18 LAB — SEDIMENTATION RATE: Sed Rate: 13 mm/hr (ref 0–20)

## 2021-03-18 LAB — TROPONIN I (HIGH SENSITIVITY)
Troponin I (High Sensitivity): 73 ng/L — ABNORMAL HIGH (ref <18)
Troponin I (High Sensitivity): 68 ng/L — ABNORMAL HIGH (ref <18)

## 2021-03-18 LAB — PROTIME-INR
INR: 1.2 (ref 0.8–1.2)
Prothrombin Time: 15.4 seconds — ABNORMAL HIGH (ref 11.4–15.2)

## 2021-03-18 LAB — PHOSPHORUS: Phosphorus: 4.3 mg/dL (ref 2.5–4.6)

## 2021-03-18 LAB — BRAIN NATRIURETIC PEPTIDE: B Natriuretic Peptide: 905 pg/mL — ABNORMAL HIGH (ref 0.0–100.0)

## 2021-03-18 LAB — LACTIC ACID, PLASMA: Lactic Acid, Venous: 1.8 mmol/L (ref 0.5–1.9)

## 2021-03-18 LAB — MRSA NEXT GEN BY PCR, NASAL: MRSA by PCR Next Gen: NOT DETECTED

## 2021-03-18 LAB — C-REACTIVE PROTEIN: CRP: 1.3 mg/dL — ABNORMAL HIGH (ref <1.0)

## 2021-03-18 MED ORDER — ALBUTEROL SULFATE HFA 108 (90 BASE) MCG/ACT IN AERS: 2.0000 | INHALATION_SPRAY | RESPIRATORY_TRACT | Status: DC | PRN

## 2021-03-18 MED ORDER — DULOXETINE HCL 60 MG PO CPEP
60.0000 mg | ORAL_CAPSULE | Freq: Every day | ORAL | Status: DC
  Administered 2021-03-19 – 2021-03-21 (×3): 60 mg via ORAL
  Filled 2021-03-18: qty 2
  Filled 2021-03-18 (×2): qty 1

## 2021-03-18 MED ORDER — CHLORHEXIDINE GLUCONATE CLOTH 2 % EX PADS
6.0000 | MEDICATED_PAD | Freq: Every day | CUTANEOUS | Status: DC
  Administered 2021-03-19 – 2021-03-21 (×3): 6 via TOPICAL

## 2021-03-18 MED ORDER — VERICIGUAT 10 MG PO TABS: 1.0000 | ORAL_TABLET | Freq: Every day | ORAL | Status: DC

## 2021-03-18 MED ORDER — ONDANSETRON HCL 4 MG/2ML IJ SOLN: 4.0000 mg | Freq: Three times a day (TID) | INTRAMUSCULAR | Status: DC | PRN

## 2021-03-18 MED ORDER — FLUTICASONE FUROATE-VILANTEROL 100-25 MCG/INH IN AEPB
1.0000 | INHALATION_SPRAY | Freq: Every day | RESPIRATORY_TRACT | Status: DC
  Administered 2021-03-19 – 2021-03-21 (×3): 1 via RESPIRATORY_TRACT
  Filled 2021-03-18: qty 28

## 2021-03-18 MED ORDER — ADULT MULTIVITAMIN W/MINERALS CH
1.0000 | ORAL_TABLET | Freq: Every day | ORAL | Status: DC
  Administered 2021-03-18 – 2021-03-21 (×4): 1 via ORAL
  Filled 2021-03-18 (×3): qty 1

## 2021-03-18 MED ORDER — NOREPINEPHRINE 4 MG/250ML-% IV SOLN
2.0000 ug/min | INTRAVENOUS | Status: DC
  Administered 2021-03-18: 6 ug/min via INTRAVENOUS
  Filled 2021-03-18: qty 250

## 2021-03-18 MED ORDER — VANCOMYCIN HCL 1250 MG/250ML IV SOLN
1250.0000 mg | INTRAVENOUS | Status: DC
  Administered 2021-03-19: 1250 mg via INTRAVENOUS
  Filled 2021-03-18 (×2): qty 250

## 2021-03-18 MED ORDER — IPRATROPIUM-ALBUTEROL 0.5-2.5 (3) MG/3ML IN SOLN
3.0000 mL | Freq: Four times a day (QID) | RESPIRATORY_TRACT | Status: DC | PRN
  Administered 2021-03-18 – 2021-03-20 (×3): 3 mL via RESPIRATORY_TRACT
  Filled 2021-03-18 (×3): qty 3

## 2021-03-18 MED ORDER — APIXABAN 5 MG PO TABS
5.0000 mg | ORAL_TABLET | Freq: Two times a day (BID) | ORAL | Status: DC
  Administered 2021-03-18 – 2021-03-21 (×6): 5 mg via ORAL
  Filled 2021-03-18 (×6): qty 1

## 2021-03-18 MED ORDER — GABAPENTIN 400 MG PO CAPS
800.0000 mg | ORAL_CAPSULE | Freq: Four times a day (QID) | ORAL | Status: DC
  Administered 2021-03-18 – 2021-03-21 (×11): 800 mg via ORAL
  Filled 2021-03-18 (×11): qty 2

## 2021-03-18 MED ORDER — PIPERACILLIN-TAZOBACTAM 3.375 G IVPB 30 MIN
3.3750 g | Freq: Once | INTRAVENOUS | Status: AC
  Administered 2021-03-18: 3.375 g via INTRAVENOUS
  Filled 2021-03-18: qty 50

## 2021-03-18 MED ORDER — SODIUM CHLORIDE 0.9 % IV SOLN
1.0000 g | INTRAVENOUS | Status: DC
  Administered 2021-03-18 – 2021-03-19 (×2): 1 g via INTRAVENOUS
  Filled 2021-03-18 (×3): qty 10

## 2021-03-18 MED ORDER — ACETAMINOPHEN 325 MG PO TABS: 650.0000 mg | ORAL_TABLET | Freq: Four times a day (QID) | ORAL | Status: DC | PRN

## 2021-03-18 MED ORDER — INSULIN ASPART 100 UNIT/ML IJ SOLN
0.0000 [IU] | Freq: Three times a day (TID) | INTRAMUSCULAR | Status: DC
  Administered 2021-03-18 – 2021-03-19 (×3): 2 [IU] via SUBCUTANEOUS
  Administered 2021-03-19: 5 [IU] via SUBCUTANEOUS
  Administered 2021-03-19: 3 [IU] via SUBCUTANEOUS
  Administered 2021-03-20: 2 [IU] via SUBCUTANEOUS
  Administered 2021-03-20: 5 [IU] via SUBCUTANEOUS
  Administered 2021-03-20: 3 [IU] via SUBCUTANEOUS
  Administered 2021-03-20: 2 [IU] via SUBCUTANEOUS
  Administered 2021-03-21: 5 [IU] via SUBCUTANEOUS
  Filled 2021-03-18 (×9): qty 1

## 2021-03-18 MED ORDER — TRAZODONE HCL 50 MG PO TABS
150.0000 mg | ORAL_TABLET | Freq: Every day | ORAL | Status: DC
  Administered 2021-03-18 – 2021-03-20 (×3): 150 mg via ORAL
  Filled 2021-03-18 (×4): qty 1

## 2021-03-18 MED ORDER — INSULIN ASPART 100 UNIT/ML IJ SOLN: 0.0000 [IU] | Freq: Three times a day (TID) | INTRAMUSCULAR | Status: DC

## 2021-03-18 MED ORDER — ATORVASTATIN CALCIUM 20 MG PO TABS
80.0000 mg | ORAL_TABLET | Freq: Every day | ORAL | Status: DC
  Administered 2021-03-18 – 2021-03-20 (×3): 80 mg via ORAL
  Filled 2021-03-18 (×3): qty 4

## 2021-03-18 MED ORDER — CLOPIDOGREL BISULFATE 75 MG PO TABS
75.0000 mg | ORAL_TABLET | Freq: Every day | ORAL | Status: DC
  Administered 2021-03-18 – 2021-03-21 (×4): 75 mg via ORAL
  Filled 2021-03-18 (×4): qty 1

## 2021-03-18 MED ORDER — MAGNESIUM SULFATE 4 GM/100ML IV SOLN
4.0000 g | Freq: Once | INTRAVENOUS | Status: AC
  Administered 2021-03-18: 4 g via INTRAVENOUS
  Filled 2021-03-18: qty 100

## 2021-03-18 MED ORDER — NOREPINEPHRINE 4 MG/250ML-% IV SOLN
0.0000 ug/min | INTRAVENOUS | Status: DC
  Filled 2021-03-18: qty 250

## 2021-03-18 MED ORDER — VANCOMYCIN HCL IN DEXTROSE 1-5 GM/200ML-% IV SOLN
1000.0000 mg | Freq: Once | INTRAVENOUS | Status: AC
  Administered 2021-03-18: 1000 mg via INTRAVENOUS
  Filled 2021-03-18: qty 200

## 2021-03-18 MED ORDER — FLUTICASONE-UMECLIDIN-VILANT 100-62.5-25 MCG/INH IN AEPB: 1.0000 | INHALATION_SPRAY | Freq: Every day | RESPIRATORY_TRACT | Status: DC

## 2021-03-18 MED ORDER — OXYCODONE-ACETAMINOPHEN 5-325 MG PO TABS
1.0000 | ORAL_TABLET | ORAL | Status: DC | PRN
  Administered 2021-03-18 – 2021-03-19 (×2): 1 via ORAL
  Filled 2021-03-18 (×2): qty 1

## 2021-03-18 MED ORDER — DIGOXIN 125 MCG PO TABS
0.1250 mg | ORAL_TABLET | Freq: Every day | ORAL | Status: DC
  Administered 2021-03-19 – 2021-03-21 (×3): 0.125 mg via ORAL
  Filled 2021-03-18 (×3): qty 1

## 2021-03-18 MED ORDER — ALBUTEROL SULFATE (2.5 MG/3ML) 0.083% IN NEBU
2.5000 mg | INHALATION_SOLUTION | RESPIRATORY_TRACT | Status: DC | PRN
  Administered 2021-03-19: 2.5 mg via RESPIRATORY_TRACT
  Filled 2021-03-18: qty 3

## 2021-03-18 MED ORDER — GABAPENTIN 800 MG PO TABS
800.0000 mg | ORAL_TABLET | Freq: Four times a day (QID) | ORAL | Status: DC
  Filled 2021-03-18 (×2): qty 1

## 2021-03-18 MED ORDER — EZETIMIBE 10 MG PO TABS
10.0000 mg | ORAL_TABLET | Freq: Every day | ORAL | Status: DC
  Administered 2021-03-19 – 2021-03-21 (×3): 10 mg via ORAL
  Filled 2021-03-18 (×3): qty 1

## 2021-03-18 MED ORDER — PANTOPRAZOLE SODIUM 40 MG PO TBEC
40.0000 mg | DELAYED_RELEASE_TABLET | Freq: Every day | ORAL | Status: DC
  Administered 2021-03-19 – 2021-03-21 (×3): 40 mg via ORAL
  Filled 2021-03-18 (×3): qty 1

## 2021-03-18 MED ORDER — METHYLPREDNISOLONE SODIUM SUCC 40 MG IJ SOLR
40.0000 mg | Freq: Two times a day (BID) | INTRAMUSCULAR | Status: DC
  Administered 2021-03-18 – 2021-03-19 (×2): 40 mg via INTRAVENOUS
  Filled 2021-03-18 (×2): qty 1

## 2021-03-18 MED ORDER — NICOTINE 21 MG/24HR TD PT24
21.0000 mg | MEDICATED_PATCH | Freq: Every day | TRANSDERMAL | Status: DC
  Administered 2021-03-18 – 2021-03-21 (×4): 21 mg via TRANSDERMAL
  Filled 2021-03-18 (×4): qty 1

## 2021-03-18 MED ORDER — UMECLIDINIUM BROMIDE 62.5 MCG/INH IN AEPB
1.0000 | INHALATION_SPRAY | Freq: Every day | RESPIRATORY_TRACT | Status: DC
  Administered 2021-03-19 – 2021-03-21 (×3): 1 via RESPIRATORY_TRACT
  Filled 2021-03-18: qty 7

## 2021-03-18 MED ORDER — INSULIN ASPART 100 UNIT/ML IJ SOLN: 0.0000 [IU] | Freq: Every day | INTRAMUSCULAR | Status: DC

## 2021-03-18 MED ORDER — SODIUM CHLORIDE 0.9 % IV BOLUS
500.0000 mL | Freq: Once | INTRAVENOUS | Status: AC
  Administered 2021-03-18: 500 mL via INTRAVENOUS

## 2021-03-18 NOTE — Progress Notes (Signed)
Pharmacy Antibiotic Note  [name] [name] is a 60 y.o. male admitted on 03/18/2021 with cellulitis.  Pharmacy has been consulted for vancomycin dosing.  Left calf cellulitis with oozing. PMH significant for T2DM, COPD, HTN. Hx of MRSA infection in wound on 05/06/2016. Creatinine bump of 1.47 above baseline (~1.30). Received vancomycin 1,000 mg x 1 in the ED.    Plan: Give vancomycin 1,000 mg now to complete loading dose of 2,000 mg.  Maintenance dose vancomycin 1250 mg every 24 hours.  Calculated AUC 469.1 Cmax 32.6 Cmin 11.3 Goal AUC 400-550  Continue ceftriaxone 1 gram every 24 hours  Monitor renal function, clinical course, and length of thearpy.  Height: '5\' 7"'$  (170.2 cm) Weight: 78.9 kg (174 lb) IBW/kg (Calculated) : 66.1  Temp (24hrs), Avg:98.1 F (36.7 C), Min:98.1 F (36.7 C), Max:98.1 F (36.7 C)  Recent Labs  Lab 03/18/21 1110 03/18/21 1201  WBC 5.4  --   CREATININE 1.47*  --   LATICACIDVEN  --  1.8    Estimated Creatinine Clearance: 51.2 mL/min (A) (by C-G formula based on SCr of 1.47 mg/dL (H)).    Allergies  Allergen Reactions   Dulaglutide Anaphylaxis, Diarrhea and Hives   Ace Inhibitors     hypotension   Other Itching    Skin itching associated with nitro patch   Prednisone Rash    Antimicrobials this admission: Vancomycin 10/9 >>  ceftriaxone 10/9 >>   Dose adjustments this admission: None  Microbiology results: 10/9 BCx: in process  Thank you for allowing pharmacy to be a part of this patient's care.   Wynelle Cleveland, PharmD Pharmacy Resident  03/18/2021 2:59 PM

## 2021-03-18 NOTE — ED Provider Notes (Signed)
[facility] Emergency Department Provider Note ____________________________________________   Event Date/Time   First MD Initiated Contact with Patient 03/18/21 1125     (approximate)  I have reviewed the triage vital signs and the nursing notes.   HISTORY  Chief Complaint leg infection and Chest Pain    HPI LOGUN CHINO is a 60 y.o. male with PMH as noted below including diabetes, CHF, COPD, hypertension, atrial fibrillation, who presents with purulent drainage from a wound in his left leg for the last week, persistent course, associated with redness and swelling although he states that has been there for longer.  The patient was started on Keflex by his vascular surgeon few days ago for possible cellulitis but states that it has not improved.  He also reports some intermittent chest pain over the last several days which he describes as "light" in intensity and intermittent.  He has some shortness of breath and lightheadedness as well, however he is not having the chest pain at this time.   Past Medical History:  Diagnosis Date   Acute on chronic heart failure ([place])    AKI (acute kidney injury) ([place]) 01/03/2018   Angina at rest [facility]) 08/13/2017   Arthritis    Asthma    Atherosclerosis    BPH (benign prostatic hyperplasia)    Carotid arterial disease ([place])    Cellulitis of foot, left 09/27/2019   Charcot's joint of foot, right    Chronic combined systolic (congestive) and diastolic (congestive) heart failure ([place])    a. 02/2013 EF 50% by LV gram; b. 04/2017 Echo: EF 25-30%. diff HK. Gr2 DD; c. 06/2020 Echo: EF <20%, glob HK. RVS 53.44mHg. Sev BAE. Mild to mod TR.   Chronic foot pain (Secondary Area of Pain) (Right) 01/29/2018   COPD (chronic obstructive pulmonary disease) (HCC)    COPD with acute exacerbation ([place] 01/30/2020   Coronary artery disease    a. 2013 S/P PCI of LAD ([facility];  b. 03/2013 PCI: RCA 90p ( 2.5 x 23 mm DES); c. 02/2014 Cath: patent  RCA stent->Med Rx; d. 04/2017 Cath: LM nl, LAd 30ost/p, 617m, D1 80ost, LCX 90p/m, OM1 85, RCA 70/20p, 8076m0d->Referred for CT Surg-felt to be poor candidate; d. 01/2020 Cath: LM nl, LAD 20p/m, 60d. D1 80. LCX 90p/m. LPAV 95, RCA 80/20p, 80p/m.   Diabetic neuropathy ([place]CC[place]  Diabetic ulcer of right foot (HCCR[place]  Hern[place]    HFrEF (heart failure with reduced ejection fraction) ([place]CC[place]8/22/2021   Hyperlipidemia    Hypertension    Ischemic cardiomyopathy    a. 02/2013 EF 50% by LV gram; b. 04/2017 Echo: EF 25-30%. diff HK. Gr2 DD; c. 06/2020 Echo: EF <20%, glob HK. RVS 53.2mm4m Sev BAE. Mild to mod TR.   Morbid obesity (HCC)[place] Neuropathy    PAD (peripheral artery disease) (HCC)[place] a. Followed by Dr. Dew;[place] 08/2010 Periph Angio: RSFA 70-80p (6X60 self-expanding stent); c. 09/2010 Periph Angio: L SFA 100p (7x unknown length self-expanding stent); d. 06/2015 Periph Angio: R SFA short segment occlusion (6x12 self-expanding stent).   Persistent atrial fibrillation (HCC)    Restless leg syndrome    Sepsis (HCC)[place]/21/2019   Sleep apnea    Subclavian artery stenosis, right (HCC)    Syncope and collapse    Tobacco use    a. 75+ yr hx - still smoking 1ppd, down from 2 ppd.   Type II diabetes mellitus (HCC)[place]  Unstable angina ([place]) 07/11/2016   Varicose vein     Patient Active Problem List   Diagnosis Date Noted   Cellulitis of left lower extremity 03/18/2021   BPH (benign prostatic hyperplasia)    Depression    CKD (chronic kidney disease), stage IIIa    Elevated troponin    Acquired absence of right leg below knee ([place]) 08/22/2020   Pyogenic inflammation of bone ([place])    Chronic a-fib ([place])    Demand ischemia ([place])    Palliative care by specialist    CHF (congestive heart failure) ([place]) 06/18/2020   Chronic anticoagulation 06/17/2020   Chronic venous insufficiency 02/10/2020   Persistent atrial fibrillation ([place]) 01/19/2020   Chronic bilateral low back pain with sciatica 10/08/2019    Medication management contract signed 10/08/2019   Chronic hyponatremia 09/27/2019   CAD (coronary artery disease) 08/31/2019   Acute on chronic systolic CHF (congestive heart failure) ([place]) 11/05/2018   Open toe wound 10/01/2018   Venous ulcer of left leg ([place]) 08/19/2018   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[place]) 04/28/2018   Abnormal MRI, shoulder (Right) 02/23/2018   Abnormal MRI, cervical spine (2016) 02/23/2018   DDD (degenerative disc disease), cervical 02/23/2018   Cervical foraminal stenosis (Bilateral) 02/23/2018   Cervical central spinal stenosis 02/23/2018   Cervical facet hypertrophy 02/23/2018   Arthralgia of acromioclavicular joint (Right) 02/23/2018   Osteoarthritis of  AC (acromioclavicular) joint (Right) 02/23/2018   Biceps tendinosis of shoulder (Right) 02/23/2018   Tendinopathy of rotator cuff (Right) 02/23/2018   Vitamin D deficiency 02/23/2018   Atherosclerosis of native arteries of the extremities with ulceration ([place]) 02/15/2018   Peripheral edema 02/05/2018   Status post peripherally inserted central catheter (PICC) central line placement 02/04/2018   Chronic lower extremity pain (Primary Area of Pain) (Right) 01/29/2018   Chronic shoulder pain (Tertiary Area of Pain) (Right) 01/29/2018   Chronic pain syndrome 01/29/2018   Long term current use of opiate analgesic 01/29/2018   Medication monitoring encounter 01/29/2018   Disorder of skeletal system 01/29/2018   Problems influencing health status 01/29/2018   NSTEMI (non-ST elevated myocardial infarction) ([place]) 11/21/2017   HLD (hyperlipidemia) 09/10/2017   Angina pectoris ([place]) 09/08/2017   Lymphedema [id number]   Chronic systolic CHF (congestive heart failure) ([place]) 04/23/2017   Chest pain 07/11/2016   Foot ulcer (Right)    Stable angina (HCC)    Pressure injury of skin 05/06/2016   Diabetic foot infection ([place]) 05/05/2016   Type 2 diabetes mellitus with hyperlipidemia ([place]) 01/26/2016    Cellulitis 11/21/2015   Morbid obesity ([place]) 11/17/2014   PAD (peripheral artery disease) ([place])    Hypertension    Tobacco use    COPD (chronic obstructive pulmonary disease) ([place]) 05/19/2012   Diabetic neuropathy ([place]) 05/19/2012   PVD (peripheral vascular disease) ([place]) 03/27/2012    Past Surgical History:  Procedure Laterality Date   ABDOMINAL AORTAGRAM N/A 06/23/2013   Procedure: ABDOMINAL Maxcine Ham;  Surgeon: Wellington Hampshire, MD;  Location: [facility] CATH LAB;  Service: Cardiovascular;  Laterality: N/A;   AMPUTATION Right 04/01/2018   Procedure: AMPUTATION BELOW KNEE;  Surgeon: Algernon Huxley, MD;  Location: [facility] ORS;  Service: Vascular;  Laterality: Right;   AMPUTATION TOE Left 08/11/2020   Procedure: AMPUTATION TOE-2nd Toe;  Surgeon: Samara Deist, DPM;  Location: [facility] ORS;  Service: Podiatry;  Laterality: Left;   APPENDECTOMY     CARDIAC CATHETERIZATION  10/14   [place]; X1 STENT PROXIMAL RCA   CARDIAC CATHETERIZATION  02/2010  [facility]   CARDIAC [facility]ATION  [facility]/21/2014   [facility]   CLAVICLE SURGERY     HERNIA REPAIR     IRRIGATION AND DEBRIDEMENT FOOT Right 01/04/2018   Procedure: IRRIGATION AND DEBRIDEMENT FOOT;  Surgeon: Samara Deist, DPM;  Location: [facility] ORS;  Service: Podiatry;  Laterality: Right;   LEFT HEART CATH AND CORONARY ANGIOGRAPHY N/A 01/20/2020   Procedure: LEFT HEART CATH AND CORONARY ANGIOGRAPHY;  Surgeon: Wellington Hampshire, MD;  Location: [facility] CV LAB;  Service: Cardiovascular;  Laterality: N/A;   LEFT HEART CATH AND CORS/GRAFTS ANGIOGRAPHY N/A 04/28/2017   Procedure: LEFT HEART CATH AND CORONARY ANGIOGRAPHY;  Surgeon: Wellington Hampshire, MD;  Location: [facility] CV LAB;  Service: Cardiovascular;  Laterality: N/A;   LOWER EXTREMITY ANGIOGRAPHY Right 03/03/2018   Procedure: LOWER EXTREMITY ANGIOGRAPHY;  Surgeon: Katha Cabal, MD;  Location: [facility] CV LAB;  Service: Cardiovascular;  Laterality: Right;   LOWER EXTREMITY ANGIOGRAPHY Left 08/24/2018    Procedure: LOWER EXTREMITY ANGIOGRAPHY;  Surgeon: Algernon Huxley, MD;  Location: [facility] CV LAB;  Service: Cardiovascular;  Laterality: Left;   LOWER EXTREMITY ANGIOGRAPHY Left 06/14/2019   Procedure: LOWER EXTREMITY ANGIOGRAPHY;  Surgeon: Algernon Huxley, MD;  Location: [facility] CV LAB;  Service: Cardiovascular;  Laterality: Left;   LOWER EXTREMITY ANGIOGRAPHY Left 08/02/2019   Procedure: LOWER EXTREMITY ANGIOGRAPHY;  Surgeon: Algernon Huxley, MD;  Location: [facility] CV LAB;  Service: Cardiovascular;  Laterality: Left;   LOWER EXTREMITY ANGIOGRAPHY Left 07/17/2020   Procedure: LOWER EXTREMITY ANGIOGRAPHY;  Surgeon: Algernon Huxley, MD;  Location: [facility] CV LAB;  Service: Cardiovascular;  Laterality: Left;   PERIPHERAL ARTERIAL STENT GRAFT     x2 left/right   PERIPHERAL VASCULAR BALLOON ANGIOPLASTY Right 01/06/2018   Procedure: PERIPHERAL VASCULAR BALLOON ANGIOPLASTY;  Surgeon: Katha Cabal, MD;  Location: [facility] CV LAB;  Service: Cardiovascular;  Laterality: Right;   PERIPHERAL VASCULAR CATHETERIZATION N/A 07/06/2015   Procedure: Abdominal Aortogram w/Lower Extremity;  Surgeon: Algernon Huxley, MD;  Location: [facility] CV LAB;  Service: Cardiovascular;  Laterality: N/A;   PERIPHERAL VASCULAR CATHETERIZATION  07/06/2015   Procedure: Lower Extremity Intervention;  Surgeon: Algernon Huxley, MD;  Location: [facility] CV LAB;  Service: Cardiovascular;;    Prior to Admission medications   Medication Sig Start Date End Date Taking? Authorizing Provider  albuterol (VENTOLIN HFA) 108 (90 Base) MCG/ACT inhaler Inhale into the lungs every 6 (six) hours as needed for wheezing or shortness of breath.    [provider]  atorvastatin (LIPITOR) 80 MG tablet Take 1 tablet (80 mg total) by mouth daily. 10/09/20   Minna Merritts, MD  carvedilol (COREG) 3.125 MG tablet TAKE 1 TABLET BY MOUTH TWICE DAILY WITH MEALS 03/16/21   Gollan, Kathlene November, MD  cephALEXin (KEFLEX) 500 MG capsule Take  1 capsule (500 mg total) by mouth 3 (three) times daily. 03/15/21   Kris Hartmann, NP  clopidogrel (PLAVIX) 75 MG tablet Take 1 tablet (75 mg total) by mouth daily. 10/09/20   Minna Merritts, MD  DULoxetine (CYMBALTA) 60 MG capsule Take 60 mg by mouth daily.    [provider]  ELIQUIS 5 MG TABS tablet Take 5 mg by mouth 2 (two) times daily. 02/02/20   [provider]  empagliflozin (JARDIANCE) 10 MG TABS tablet Take 10 mg by mouth daily.  02/03/20   [provider]  ezetimibe (ZETIA) 10 MG tablet Take 1 tablet (10 mg total) by  mouth daily. 10/09/20   Minna Merritts, MD  gabapentin (NEURONTIN) 800 MG tablet Take 800 mg by mouth 4 (four) times daily.  08/14/17 04/04/29  [provider]  ipratropium-albuterol (DUONEB) 0.5-2.5 (3) MG/3ML SOLN Inhale 3 mLs into the lungs every 6 (six) hours as needed (wheezing/sob).     [provider]  isosorbide mononitrate (IMDUR) 30 MG 24 hr tablet Take 1 tablet (30 mg total) by mouth daily. 10/09/20   Minna Merritts, MD  losartan (COZAAR) 25 MG tablet Take 0.5 tablets (12.5 mg total) by mouth daily. 10/09/20   Minna Merritts, MD  metFORMIN (GLUCOPHAGE-XR) 500 MG 24 hr tablet Take 1,000 mg by mouth 2 (two) times daily. 01/02/21   [provider]  metolazone (ZAROXOLYN) 2.5 MG tablet Take 1 tablet (2.5 mg total) by mouth daily as needed. Take 1/2 hour before morning torsemide 01/23/21 04/23/21  Alisa Graff, FNP  Multiple Vitamin (MULTI-VITAMINS) TABS Take 1 tablet by mouth daily.     [provider]  nitroGLYCERIN (NITROSTAT) 0.4 MG SL tablet Place 1 tablet (0.4 mg total) under the tongue every 5 (five) minutes as needed for chest pain. 10/09/20   Minna Merritts, MD  omeprazole (PRILOSEC) 40 MG capsule Take 40 mg by mouth daily.    [provider]  Oxycodone HCl 10 MG TABS Take 10 mg by mouth every 6 (six) hours as needed (pain).  01/15/18   [provider]  polyethylene glycol (MIRALAX  / GLYCOLAX) 17 g packet Take 17 g by mouth daily as needed for mild constipation. 08/12/20   Loletha Grayer, MD  potassium chloride (KLOR-CON) 10 MEQ tablet Take 1 tablet (10 mEq total) by mouth 2 (two) times daily. 10/09/20   Minna Merritts, MD  spironolactone (ALDACTONE) 25 MG tablet Take 0.5 tablets (12.5 mg total) by mouth daily. 10/09/20   Minna Merritts, MD  SYMBICORT 80-4.5 MCG/ACT inhaler Inhale 1 puff into the lungs 2 (two) times daily. 01/01/20   [provider]  tamsulosin (FLOMAX) 0.4 MG CAPS capsule Take 1 capsule (0.4 mg total) by mouth daily after supper. 07/01/20   Shawna Clamp, MD  torsemide (DEMADEX) 20 MG tablet Take 40 mg by mouth daily.    [provider]  traZODone (DESYREL) 150 MG tablet Take 150 mg by mouth at bedtime. 07/12/19   [provider]  TRELEGY ELLIPTA 100-62.5-25 MCG/INH AEPB Inhale 1 puff into the lungs daily. 01/26/21   [provider]  VERQUVO 10 MG TABS TAKE 1 TABLET BY MOUTH ONCE DAILY 11/16/20   Darylene Price A, FNP    Allergies Dulaglutide, Ace inhibitors, Other, and Prednisone  Family History  Problem Relation Age of Onset   Heart attack Father    Heart disease Father    Hypertension Father    Hyperlipidemia Father    Hypertension Mother    Hyperlipidemia Mother     Social History Social History   Tobacco Use   Smoking status: Every Day    Packs/day: 1.00    Years: 41.00    Pack years: 41.00    Types: Cigarettes   Smokeless tobacco: Never  Vaping Use   Vaping Use: Never used  Substance Use Topics   Alcohol use: No   Drug use: No    Review of Systems  Constitutional: No fever/chills Eyes: No visual changes. ENT: No sore throat. Cardiovascular: Positive for intermittent chest pain. Respiratory: Positive for shortness of breath. Gastrointestinal: No vomiting or diarrhea.  Genitourinary:  Negative for dysuria.  Musculoskeletal: Negative for back pain.  Positive for left leg pain. Skin: Negative  for rash. Neurological: Negative for headaches, focal weakness or numbness.   ____________________________________________   PHYSICAL EXAM:  VITAL SIGNS: ED Triage Vitals  Enc Vitals Group     BP 03/18/21 1059 92/64     Pulse Rate 03/18/21 1059 62     Resp 03/18/21 1059 20     Temp 03/18/21 1059 98.1 F (36.7 C)     Temp Source 03/18/21 1059 Oral     SpO2 03/18/21 1059 94 %     Weight 03/18/21 1101 174 lb (78.9 kg)     Height 03/18/21 1101 '5\' 7"'$  (1.702 m)     Head Circumference --      Peak Flow --      Pain Score 03/18/21 1100 4     Pain Loc --      Pain Edu? --      Excl. in [place]? --     Constitutional: Alert and oriented.  Chronically ill-appearing but in no acute distress. Eyes: Conjunctivae are normal.  Head: Atraumatic. Nose: No congestion/rhinnorhea. Mouth/Throat: Mucous membranes are dry. Neck: Normal range of motion.  Cardiovascular: Normal rate, regular rhythm. Grossly normal heart sounds.  Good peripheral circulation. Respiratory: Normal respiratory effort.  No retractions. Lungs CTAB. Gastrointestinal: No distention.  Musculoskeletal: Left lower extremity with erythema and induration from the foot to the proximal calf.  Several superficial appearing scabbed ulcers to the anterior lower leg with no purulent drainage at this time. Neurologic:  Normal speech and language. No gross focal neurologic deficits are appreciated.  Skin:  Skin is warm and dry. No rash noted. Psychiatric: Mood and affect are normal. Speech and behavior are normal.  ____________________________________________   LABS (all labs ordered are listed, but only abnormal results are displayed)  Labs Reviewed  BASIC METABOLIC PANEL - Abnormal; Notable for the following components:      Result Value   Sodium 130 (*)    Chloride 89 (*)    BUN 23 (*)    Creatinine, Ser 1.47 (*)    Calcium 8.5 (*)    GFR, Estimated 55 (*)    All other components within normal limits  CBC - Abnormal; Notable  for the following components:   RBC 4.00 (*)    Hemoglobin 11.2 (*)    HCT 32.6 (*)    RDW 21.6 (*)    All other components within normal limits  PROTIME-INR - Abnormal; Notable for the following components:   Prothrombin Time 15.4 (*)    All other components within normal limits  C-REACTIVE PROTEIN - Abnormal; Notable for the following components:   CRP 1.3 (*)    All other components within normal limits  BRAIN NATRIURETIC PEPTIDE - Abnormal; Notable for the following components:   B Natriuretic Peptide 905.0 (*)    All other components within normal limits  TROPONIN I (HIGH SENSITIVITY) - Abnormal; Notable for the following components:   Troponin I (High Sensitivity) 73 (*)    All other components within normal limits  TROPONIN I (HIGH SENSITIVITY) - Abnormal; Notable for the following components:   Troponin I (High Sensitivity) 68 (*)    All other components within normal limits  CULTURE, BLOOD (ROUTINE X 2)  CULTURE, BLOOD (ROUTINE X 2)  RESP PANEL BY RT-PCR (FLU A&B, COVID) ARPGX2  SEDIMENTATION RATE  LACTIC ACID, PLASMA  HEMOGLOBIN A1C   ____________________________________________  EKG  ED ECG REPORT I,  Arta Silence, the attending physician, personally viewed and interpreted this ECG.  Date: 03/18/2021 EKG Time: 1100 Rate: 82 Rhythm: Atrial fibrillation QRS Axis: Left axis Intervals: RBBB ST/T Wave abnormalities: Nonspecific ST abnormalities Narrative Interpretation: Nonspecific abnormalities with no evidence of acute ischemia; no significant change when compared to EKG of 03/09/2021  ____________________________________________  RADIOLOGY  Chest x-ray interpreted by me shows no focal consolidation or edema XR L tibia/fibula: No acute fracture or other acute abnormality ____________________________________________   PROCEDURES  Procedure(s) performed: No  Procedures  Critical Care performed:  No ____________________________________________   INITIAL IMPRESSION / ASSESSMENT AND PLAN / ED COURSE  Pertinent labs & imaging results that were available during my care of the patient were reviewed by me and considered in my medical decision making (see chart for details).   60 year old male with PMH as noted above including diabetes, CHF, COPD, hypertension, and atrial fibrillation presents with persistent purulent drainage from a left leg wound as well as redness and swelling, with concern for infection.  It has not responded to Keflex that he is currently taking.  I reviewed the past medical records in [place].  The patient was most recently seen in the ED with shortness of breath and hypoxia in August and an elevated troponin.  He left AMA at that time.  He was prescribed Keflex after telephone encounter on 10/6.  On exam currently, the patient is overall relatively comfortable appearing.  He is hypotensive with otherwise normal vital signs.  Left lower extremity is erythematous and indurated although not particularly abnormally warm.  There are several superficial ulcers to the anterior lower leg although no purulent drainage at this time.  Overall presentation is consistent with cellulitis, however differential includes venous stasis changes/sequelae of peripheral vascular disease, lymphedema, or less likely DVT.  The patient had a negative DVT study last year in that leg and is anticoagulated.  We will obtain lab work-up, and x-ray to rule out evidence of osteomyelitis, and reassess.  I anticipate admission for IV antibiotics.  ----------------------------------------- 3:15 PM on 03/18/2021 -----------------------------------------  The work-up has been overall reassuring with normal WBC count and lactate, no clinical evidence of sepsis.  The troponin is not rising.  IV antibiotics have been ordered and I initially consulted Dr. Blaine Hamper from the hospitalist service for admission.  However,  the patient has become somewhat more hypotensive over the last hour.  He has an EF of less than 20% and given the normal lactate I do not think he is acutely septic and I am concerned for causing fluid overload if he is given additional IV fluids.  On reassessment, the patient remains alert and oriented and appears relatively comfortable.  He states that he feels somewhat vaguely unwell compared to earlier although denies any specific symptoms other than some muscular left-sided neck pain.  He has no new chest pain or shortness of breath.  I discussed the case further with Dr. Blaine Hamper.  I have ordered Levophed.  I then consulted Dr. Lanney Gins from the ICU for admission.  ____________________________________________   FINAL CLINICAL IMPRESSION(S) / ED DIAGNOSES  Final diagnoses:  Cellulitis of left lower extremity  Hypotension, unspecified hypotension type      NEW MEDICATIONS STARTED DURING THIS VISIT:  New Prescriptions   No medications on file     Note:  This document was prepared using Dragon voice recognition software and may include unintentional dictation errors.    Arta Silence, MD 03/18/21 [id number]

## 2021-03-18 NOTE — ED Triage Notes (Signed)
Pt with c/o chest pain x 6 days and oozing sore on left calf x 6 days. Pt states CP is "light" . Pt with hx of sepsis. Pt with hx of right LE extremity amputation. Pt states left leg coloring is normal, and sores have been present for a while. Pt is on Keflex for leg infection.

## 2021-03-18 NOTE — ED Notes (Signed)
RN attempted to call report to ICU. They advised RN will call me back.

## 2021-03-18 NOTE — ED Notes (Signed)
RN called report to [facility] of ICU.

## 2021-03-18 NOTE — ED Notes (Signed)
RN to bedside to introduce self to patient. Pt sitting in wheel chair. Pt moved to bed for ease of access and cardiac monitoring.

## 2021-03-18 NOTE — Consult Note (Signed)
CRITICAL CARE CONSULT     Name: [name] [name] MRN: [id number] DOB: Jul 01, 1960     LOS: 0   SUBJECTIVE FINDINGS & SIGNIFICANT EVENTS    Patient description:  This is a 60 year old male with a history significant for COPD lifelong smoking, asthma overlap with OSA.  GERD major depression, varicose veins recurrent syncope and right subclavian artery stenosis, also PAF on Eliquis, PVD CHF which is systolic with an EF of less than 20%, BPH and carotid artery stenosis.  Also has CKD stage IIIa and right BKA came in with leg pain and cellulitis.  States he was at home and was feeling unwell with undue fatigue and came in with worsening generalized pain.  Further questioning reveals that left lower extremity has been cellulitic for quite some time at least 3 months.  During my evaluation patient is in septic shock with a map of 73 on Levophed support with rhonchorous labored breathing.  PCCM consultation called for further evaluation management of what appears to be septic shock secondary to left leg cellulitis.  Chest x-ray was done in the ED without consolidated infiltrate or pleural effusions however bronchitic changes are noted throughout bilaterally.  Lines/tubes :   Microbiology/Sepsis markers: Results for orders placed or performed during the hospital encounter of 03/18/21  Resp Panel by RT-PCR (Flu A&B, Covid) Nasopharyngeal Swab     Status: None   Collection Time: 03/18/21  2:43 PM   Specimen: Nasopharyngeal Swab; Nasopharyngeal(NP) swabs in vial transport medium  Result Value Ref Range Status   SARS Coronavirus 2 by RT PCR NEGATIVE NEGATIVE Final    Comment: (NOTE) SARS-CoV-2 target nucleic acids are NOT DETECTED.  The SARS-CoV-2 RNA is generally detectable in upper respiratory specimens during the acute phase of  infection. The lowest concentration of SARS-CoV-2 viral copies this assay can detect is 138 copies/mL. A negative result does not preclude SARS-Cov-2 infection and should not be used as the sole basis for treatment or other patient management decisions. A negative result may occur with  improper specimen collection/handling, submission of specimen other than nasopharyngeal swab, presence of viral mutation(s) within the areas targeted by this assay, and inadequate number of viral copies(<138 copies/mL). A negative result must be combined with clinical observations, patient history, and epidemiological information. The expected result is Negative.  Fact Sheet for Patients:  EntrepreneurPulse.com.au  Fact Sheet for Healthcare Providers:  IncredibleEmployment.be  This test is no t yet approved or cleared by the [place] FDA and  has been authorized for detection and/or diagnosis of SARS-CoV-2 by FDA under an Emergency Use Authorization (EUA). This EUA will remain  in effect (meaning this test can be used) for the duration of the COVID-19 declaration under Section 564(b)(1) of the Act, 21 U.S.C.section 360bbb-3(b)(1), unless the authorization is terminated  or revoked sooner.       Influenza A by PCR NEGATIVE NEGATIVE Final   Influenza B by PCR NEGATIVE NEGATIVE Final    Comment: (NOTE) The Xpert Xpress SARS-CoV-2/FLU/RSV plus assay is intended as an aid in the diagnosis of influenza from Nasopharyngeal swab specimens and should not be used as a sole basis for treatment. Nasal washings and aspirates are unacceptable for Xpert Xpress SARS-CoV-2/FLU/RSV testing.  Fact Sheet for Patients: EntrepreneurPulse.com.au  Fact Sheet for Healthcare Providers: IncredibleEmployment.be  This test is not yet approved or cleared by the [place] FDA and has been authorized for detection and/or diagnosis of SARS-CoV-2  by FDA under an Emergency Use Authorization (EUA). This EUA  will remain in effect (meaning this test can be used) for the duration of the COVID-19 declaration under Section 564(b)(1) of the Act, 21 U.S.C. section 360bbb-3(b)(1), unless the authorization is terminated or revoked.  Performed at [facility], [address], [place], [place] [address]     Anti-infectives:  Anti-infectives (From admission, onward)    Start     Dose/Rate Route Frequency Ordered Stop   03/19/21 1600  vancomycin (VANCOREADY) IVPB 1250 mg/250 mL        1,250 mg 166.7 mL/hr over 90 Minutes Intravenous Every 24 hours 03/18/21 1527     03/18/21 1500  cefTRIAXone (ROCEPHIN) 1 g in sodium chloride 0.9 % 100 mL IVPB        1 g 200 mL/hr over 30 Minutes Intravenous Every 24 hours 03/18/21 1435     03/18/21 1500  vancomycin (VANCOCIN) IVPB 1000 mg/200 mL premix        1,000 mg 200 mL/hr over 60 Minutes Intravenous  Once 03/18/21 1456 03/18/21 1610   03/18/21 1345  vancomycin (VANCOCIN) IVPB 1000 mg/200 mL premix        1,000 mg 200 mL/hr over 60 Minutes Intravenous  Once 03/18/21 1340 03/18/21 1509   03/18/21 1345  piperacillin-tazobactam (ZOSYN) IVPB 3.375 g        3.375 g 100 mL/hr over 30 Minutes Intravenous  Once 03/18/21 1340 03/18/21 1414        Consults: Treatment Team:  Ottie Glazier, MD       PAST MEDICAL HISTORY   Past Medical History:  Diagnosis Date   Acute on chronic heart failure ([place])    AKI (acute kidney injury) ([place]) 01/03/2018   Angina at rest [facility]) 08/13/2017   Arthritis    Asthma    Atherosclerosis    BPH (benign prostatic hyperplasia)    Carotid arterial disease ([place])    Cellulitis of foot, left 09/27/2019   Charcot's joint of foot, right    Chronic combined systolic (congestive) and diastolic (congestive) heart failure ([place])    a. 02/2013 EF 50% by LV gram; b. 04/2017 Echo: EF 25-30%. diff HK. Gr2 DD; c. 06/2020 Echo: EF <20%, glob HK. RVS 53.20mHg. Sev BAE.  Mild to mod TR.   Chronic foot pain (Secondary Area of Pain) (Right) 01/29/2018   COPD (chronic obstructive pulmonary disease) (HCC)    COPD with acute exacerbation ([place] 01/30/2020   Coronary artery disease    a. 2013 S/P PCI of LAD ([facility];  b. 03/2013 PCI: RCA 90p ( 2.5 x 23 mm DES); c. 02/2014 Cath: patent RCA stent->Med Rx; d. 04/2017 Cath: LM nl, LAd 30ost/p, 668m, D1 80ost, LCX 90p/m, OM1 85, RCA 70/20p, [id number]->Referred for CT Surg-felt to be poor candidate; d. 01/2020 Cath: LM nl, LAD 20p/m, 60d. D1 80. LCX 90p/m. LPAV 95, RCA 80/20p, 80p/m.   Diabetic neuropathy ([place]  Diabetic ulcer of right foot ([place]  Hern[place]    HFrEF (heart failure with reduced ejection fraction) ([place]8/22/2021   Hyperlipidemia    Hypertension    Ischemic cardiomyopathy    a. 02/2013 EF 50% by LV gram; b. 04/2017 Echo: EF 25-30%. diff HK. Gr2 DD; c. 06/2020 Echo: EF <20%, glob HK. RVS 53.2mm74m Sev BAE. Mild to mod TR.   Morbid obesity (HCC)[place] Neuropathy    PAD (peripheral artery disease) (HCC)[place] a. Followed by Dr. Dew;[place] 08/2010 Periph Angio: RSFA 70-80p (6X60 self-expanding stent); c. 09/2010 Periph Angio:  L SFA 100p (7x unknown length self-expanding stent); d. 06/2015 Periph Angio: R SFA short segment occlusion (6x12 self-expanding stent).   Persistent atrial fibrillation (HCC)    Restless leg syndrome    Sepsis ([place]) 03/30/2018   Sleep apnea    Subclavian artery stenosis, right (HCC)    Syncope and collapse    Tobacco use    a. 75+ yr hx - still smoking 1ppd, down from 2 ppd.   Type II diabetes mellitus (HCC)    Unstable angina ([place]) 07/11/2016   Varicose vein      SURGICAL HISTORY   Past Surgical History:  Procedure Laterality Date   ABDOMINAL AORTAGRAM N/A 06/23/2013   Procedure: ABDOMINAL Maxcine Ham;  Surgeon: Wellington Hampshire, MD;  Location: [place] CATH LAB;  Service: Cardiovascular;  Laterality: N/A;   AMPUTATION Right 04/01/2018   Procedure: AMPUTATION BELOW KNEE;  Surgeon: Algernon Huxley, MD;   Location: [facility] ORS;  Service: Vascular;  Laterality: Right;   AMPUTATION TOE Left 08/11/2020   Procedure: AMPUTATION TOE-2nd Toe;  Surgeon: Samara Deist, DPM;  Location: [facility] ORS;  Service: Podiatry;  Laterality: Left;   APPENDECTOMY     CARDIAC CATHETERIZATION  10/14   [place]; X1 STENT PROXIMAL RCA   CARDIAC CATHETERIZATION  02/2010   [facility]   CARDIAC [facility]IZATION  [facility]/21/2014   [facility]   CLAVICLE SURGERY     HERNIA REPAIR     IRRIGATION AND DEBRIDEMENT FOOT Right 01/04/2018   Procedure: IRRIGATION AND DEBRIDEMENT FOOT;  Surgeon: Samara Deist, DPM;  Location: [facility] ORS;  Service: Podiatry;  Laterality: Right;   LEFT HEART CATH AND CORONARY ANGIOGRAPHY N/A 01/20/2020   Procedure: LEFT HEART CATH AND CORONARY ANGIOGRAPHY;  Surgeon: Wellington Hampshire, MD;  Location: [place] CV LAB;  Service: Cardiovascular;  Laterality: N/A;   LEFT HEART CATH AND CORS/GRAFTS ANGIOGRAPHY N/A 04/28/2017   Procedure: LEFT HEART CATH AND CORONARY ANGIOGRAPHY;  Surgeon: Wellington Hampshire, MD;  Location: [place] CV LAB;  Service: Cardiovascular;  Laterality: N/A;   LOWER EXTREMITY ANGIOGRAPHY Right 03/03/2018   Procedure: LOWER EXTREMITY ANGIOGRAPHY;  Surgeon: Katha Cabal, MD;  Location: [place] CV LAB;  Service: Cardiovascular;  Laterality: Right;   LOWER EXTREMITY ANGIOGRAPHY Left 08/24/2018   Procedure: LOWER EXTREMITY ANGIOGRAPHY;  Surgeon: Algernon Huxley, MD;  Location: [place] CV LAB;  Service: Cardiovascular;  Laterality: Left;   LOWER EXTREMITY ANGIOGRAPHY Left 06/14/2019   Procedure: LOWER EXTREMITY ANGIOGRAPHY;  Surgeon: Algernon Huxley, MD;  Location: [place] CV LAB;  Service: Cardiovascular;  Laterality: Left;   LOWER EXTREMITY ANGIOGRAPHY Left 08/02/2019   Procedure: LOWER EXTREMITY ANGIOGRAPHY;  Surgeon: Algernon Huxley, MD;  Location: [place] CV LAB;  Service: Cardiovascular;  Laterality: Left;   LOWER EXTREMITY ANGIOGRAPHY Left 07/17/2020   Procedure: LOWER EXTREMITY  ANGIOGRAPHY;  Surgeon: Algernon Huxley, MD;  Location: [place] CV LAB;  Service: Cardiovascular;  Laterality: Left;   PERIPHERAL ARTERIAL STENT GRAFT     x2 left/right   PERIPHERAL VASCULAR BALLOON ANGIOPLASTY Right 01/06/2018   Procedure: PERIPHERAL VASCULAR BALLOON ANGIOPLASTY;  Surgeon: Katha Cabal, MD;  Location: [place] CV LAB;  Service: Cardiovascular;  Laterality: Right;   PERIPHERAL VASCULAR CATHETERIZATION N/A 07/06/2015   Procedure: Abdominal Aortogram w/Lower Extremity;  Surgeon: Algernon Huxley, MD;  Location: [place] CV LAB;  Service: Cardiovascular;  Laterality: N/A;   PERIPHERAL VASCULAR CATHETERIZATION  07/06/2015   Procedure: Lower Extremity Intervention;  Surgeon: Algernon Huxley, MD;  Location[facility]  INVASIVE CV LAB;  Service: Cardiovascular;;     FAMILY HISTORY   Family History  Problem Relation Age of Onset   Heart attack Father    Heart disease Father    Hypertension Father    Hyperlipidemia Father    Hypertension Mother    Hyperlipidemia Mother      SOCIAL HISTORY   Social History   Tobacco Use   Smoking status: Every Day    Packs/day: 1.00    Years: 41.00    Pack years: 41.00    Types: Cigarettes   Smokeless tobacco: Never  Vaping Use   Vaping Use: Never used  Substance Use Topics   Alcohol use: No   Drug use: No     MEDICATIONS   Current Medication:  Current Facility-Administered Medications:    acetaminophen (TYLENOL) tablet 650 mg, 650 mg, Oral, Q6H PRN, Ivor Costa, MD   albuterol (PROVENTIL) (2.5 MG/3ML) 0.083% nebulizer solution 2.5 mg, 2.5 mg, Nebulization, Q4H PRN, Nazari, Walid A, RPH   cefTRIAXone (ROCEPHIN) 1 g in sodium chloride 0.9 % 100 mL IVPB, 1 g, Intravenous, Q24H, Niu, Xilin, MD   insulin aspart (novoLOG) injection 0-5 Units, 0-5 Units, Subcutaneous, QHS, Niu, Xilin, MD   insulin aspart (novoLOG) injection 0-9 Units, 0-9 Units, Subcutaneous, TID WC, Ivor Costa, MD   nicotine (NICODERM CQ - dosed in mg/24 hours)  patch 21 mg, 21 mg, Transdermal, Daily, Ivor Costa, MD   norepinephrine (LEVOPHED) '4mg'$  in 243m premix infusion, 0-40 mcg/min, Intravenous, Continuous, Siadecki, SFelix Ahmadi MD, Last Rate: 15 mL/hr at 03/18/21 1537, 4 mcg/min at 03/18/21 1537   ondansetron (ZOFRAN) injection 4 mg, 4 mg, Intravenous, Q8H PRN, NIvor Costa MD   oxyCODONE-acetaminophen (PERCOCET/ROXICET) 5-325 MG per tablet 1 tablet, 1 tablet, Oral, Q4H PRN, NIvor Costa MD   [START ON 03/19/2021] vancomycin (VANCOREADY) IVPB 1250 mg/250 mL, 1,250 mg, Intravenous, Q24H, CWynelle Cleveland RPH  Current Outpatient Medications:    albuterol (VENTOLIN HFA) 108 (90 Base) MCG/ACT inhaler, Inhale into the lungs every 6 (six) hours as needed for wheezing or shortness of breath., Disp: , Rfl:    atorvastatin (LIPITOR) 80 MG tablet, Take 1 tablet (80 mg total) by mouth daily., Disp: 90 tablet, Rfl: 3   carvedilol (COREG) 3.125 MG tablet, TAKE 1 TABLET BY MOUTH TWICE DAILY WITH MEALS, Disp: 90 tablet, Rfl: 0   cephALEXin (KEFLEX) 500 MG capsule, Take 1 capsule (500 mg total) by mouth 3 (three) times daily., Disp: 21 capsule, Rfl: 0   clopidogrel (PLAVIX) 75 MG tablet, Take 1 tablet (75 mg total) by mouth daily., Disp: 90 tablet, Rfl: 3   DULoxetine (CYMBALTA) 60 MG capsule, Take 60 mg by mouth daily., Disp: , Rfl:    ELIQUIS 5 MG TABS tablet, Take 5 mg by mouth 2 (two) times daily., Disp: , Rfl:    empagliflozin (JARDIANCE) 10 MG TABS tablet, Take 10 mg by mouth daily. , Disp: , Rfl:    ezetimibe (ZETIA) 10 MG tablet, Take 1 tablet (10 mg total) by mouth daily., Disp: 90 tablet, Rfl: 3   gabapentin (NEURONTIN) 800 MG tablet, Take 800 mg by mouth 4 (four) times daily. , Disp: , Rfl:    ipratropium-albuterol (DUONEB) 0.5-2.5 (3) MG/3ML SOLN, Inhale 3 mLs into the lungs every 6 (six) hours as needed (wheezing/sob). , Disp: , Rfl:    isosorbide mononitrate (IMDUR) 30 MG 24 hr tablet, Take 1 tablet (30 mg total) by mouth daily., Disp: 90 tablet, Rfl:  3   losartan (COZAAR)  25 MG tablet, Take 0.5 tablets (12.5 mg total) by mouth daily., Disp: 46 tablet, Rfl: 3   metFORMIN (GLUCOPHAGE-XR) 500 MG 24 hr tablet, Take 1,000 mg by mouth 2 (two) times daily., Disp: , Rfl:    metolazone (ZAROXOLYN) 2.5 MG tablet, Take 1 tablet (2.5 mg total) by mouth daily as needed. Take 1/2 hour before morning torsemide, Disp: 5 tablet, Rfl: 0   Multiple Vitamin (MULTI-VITAMINS) TABS, Take 1 tablet by mouth daily. , Disp: , Rfl:    nitroGLYCERIN (NITROSTAT) 0.4 MG SL tablet, Place 1 tablet (0.4 mg total) under the tongue every 5 (five) minutes as needed for chest pain., Disp: 30 tablet, Rfl: 5   omeprazole (PRILOSEC) 40 MG capsule, Take 40 mg by mouth daily., Disp: , Rfl:    Oxycodone HCl 10 MG TABS, Take 10 mg by mouth every 6 (six) hours as needed (pain). , Disp: , Rfl:    polyethylene glycol (MIRALAX / GLYCOLAX) 17 g packet, Take 17 g by mouth daily as needed for mild constipation., Disp: 14 each, Rfl: 0   potassium chloride (KLOR-CON) 10 MEQ tablet, Take 1 tablet (10 mEq total) by mouth 2 (two) times daily., Disp: 180 tablet, Rfl: 3   spironolactone (ALDACTONE) 25 MG tablet, Take 0.5 tablets (12.5 mg total) by mouth daily., Disp: 90 tablet, Rfl: 3   SYMBICORT 80-4.5 MCG/ACT inhaler, Inhale 1 puff into the lungs 2 (two) times daily., Disp: , Rfl:    tamsulosin (FLOMAX) 0.4 MG CAPS capsule, Take 1 capsule (0.4 mg total) by mouth daily after supper., Disp: 30 capsule, Rfl: 1   torsemide (DEMADEX) 20 MG tablet, Take 40 mg by mouth daily., Disp: , Rfl:    traZODone (DESYREL) 150 MG tablet, Take 150 mg by mouth at bedtime., Disp: , Rfl:    TRELEGY ELLIPTA 100-62.5-25 MCG/INH AEPB, Inhale 1 puff into the lungs daily., Disp: , Rfl:    VERQUVO 10 MG TABS, TAKE 1 TABLET BY MOUTH ONCE DAILY, Disp: 30 tablet, Rfl: 5    ALLERGIES   Dulaglutide, Ace inhibitors, Other, and Prednisone    REVIEW OF SYSTEMS     10 point ROS conducted and is negative except for left lower  extremity pain and undue fatigue  PHYSICAL EXAMINATION   Vital Signs: Temp:  [98.1 F (36.7 C)] 98.1 F (36.7 C) (10/09 1059) Pulse Rate:  [59-74] 70 (10/09 1500) Resp:  [14-20] 14 (10/09 1600) BP: (74-92)/(43-72) 83/68 (10/09 1600) SpO2:  [93 %-97 %] 95 % (10/09 1600) Weight:  [78.9 kg] 78.9 kg (10/09 1101)  GENERAL: Age-appropriate mild distress oriented x3 HEAD: Normocephalic, atraumatic.  EYES: Pupils equal, round, reactive to light.  No scleral icterus.  MOUTH: Moist mucosal membrane. NECK: Supple. No thyromegaly. No nodules. No JVD.  PULMONARY: Rhonchorous breathing bilaterally CARDIOVASCULAR: S1 and S2. Regular rate and rhythm. No murmurs, rubs, or gallops.  GASTROINTESTINAL: Soft, nontender, non-distended. No masses. Positive bowel sounds. No hepatosplenomegaly.  MUSCULOSKELETAL: No swelling, clubbing, or edema.  Right BKA positive NEUROLOGIC: Mild distress due to acute illness SKIN:intact,warm,dry   PERTINENT DATA     Infusions:  cefTRIAXone (ROCEPHIN)  IV     norepinephrine (LEVOPHED) Adult infusion 4 mcg/min (03/18/21 1537)   [START ON 03/19/2021] vancomycin     Scheduled Medications:  insulin aspart  0-5 Units Subcutaneous QHS   insulin aspart  0-9 Units Subcutaneous TID WC   nicotine  21 mg Transdermal Daily   PRN Medications: acetaminophen, albuterol, ondansetron (ZOFRAN) IV, oxyCODONE-acetaminophen Hemodynamic parameters:   Intake/Output:  No intake/output data recorded.  Ventilator  Settings:     LAB RESULTS:  Basic Metabolic Panel: Recent Labs  Lab 03/18/21 1110  NA 130*  K 4.2  CL 89*  CO2 31  GLUCOSE 90  BUN 23*  CREATININE 1.47*  CALCIUM 8.5*   Liver Function Tests: No results for input(s): AST, ALT, ALKPHOS, BILITOT, PROT, ALBUMIN in the last 168 hours. No results for input(s): LIPASE, AMYLASE in the last 168 hours. No results for input(s): AMMONIA in the last 168 hours. CBC: Recent Labs  Lab 03/18/21 1110  WBC 5.4  HGB  11.2*  HCT 32.6*  MCV 81.5  PLT 164   Cardiac Enzymes: No results for input(s): CKTOTAL, CKMB, CKMBINDEX, TROPONINI in the last 168 hours. BNP: Invalid input(s): POCBNP CBG: No results for input(s): GLUCAP in the last 168 hours.     IMAGING RESULTS:  Imaging: DG Chest 2 View  Result Date: 03/18/2021 CLINICAL DATA:  60 year old male with chest pain EXAM: CHEST - 2 VIEW COMPARISON:  01/19/2021 FINDINGS: Cardiomediastinal silhouette unchanged with cardiomegaly. Calcifications of the aortic arch. Increased peribronchial thickening perceived at the bilateral hilar region. Similar appearance of coarsened interstitial markings. No interlobular septal thickening. No new confluent airspace disease. No pneumothorax. No large pleural effusion. No displaced fracture.  Degenerative changes of the spine IMPRESSION: Similar chronic changes and bronchial wall thickening, without evidence of acute cardiopulmonary disease Electronically Signed   By: Corrie Mckusick D.O.   On: 03/18/2021 11:50   DG Tibia/Fibula Left  Result Date: 03/18/2021 CLINICAL DATA:  Wound infection of the left calf 6 days. EXAM: LEFT TIBIA AND FIBULA - 2 VIEW COMPARISON:  None. FINDINGS: There is no evidence of fracture or dislocation. There is no osteopenia or bony destruction identified to suggest osteomyelitis. Soft tissues are unremarkable. IMPRESSION: No acute abnormality. Electronically Signed   By: Abelardo Diesel M.D.   On: 03/18/2021 12:31   '@PROBHOSP'$ @ DG Chest 2 View  Result Date: 03/18/2021 CLINICAL DATA:  60 year old male with chest pain EXAM: CHEST - 2 VIEW COMPARISON:  01/19/2021 FINDINGS: Cardiomediastinal silhouette unchanged with cardiomegaly. Calcifications of the aortic arch. Increased peribronchial thickening perceived at the bilateral hilar region. Similar appearance of coarsened interstitial markings. No interlobular septal thickening. No new confluent airspace disease. No pneumothorax. No large pleural effusion. No  displaced fracture.  Degenerative changes of the spine IMPRESSION: Similar chronic changes and bronchial wall thickening, without evidence of acute cardiopulmonary disease Electronically Signed   By: Corrie Mckusick D.O.   On: 03/18/2021 11:50   DG Tibia/Fibula Left  Result Date: 03/18/2021 CLINICAL DATA:  Wound infection of the left calf 6 days. EXAM: LEFT TIBIA AND FIBULA - 2 VIEW COMPARISON:  None. FINDINGS: There is no evidence of fracture or dislocation. There is no osteopenia or bony destruction identified to suggest osteomyelitis. Soft tissues are unremarkable. IMPRESSION: No acute abnormality. Electronically Signed   By: Abelardo Diesel M.D.   On: 03/18/2021 12:31        ASSESSMENT AND PLAN    Septic shock Present on admission due to left lower extremity cellulitis presumably -use vasopressors to keep MAP>65 -follow ABG and LA -follow up cultures -emperic ABX   Acute on chronic systolic CHF with EF less than 20% -oxygen as needed -Lasix as tolerated -follow up cardiac enzymes as indicated ICU monitoring  Renal Failure-CKD  -follow chem 7 -follow UO -DC nonessential nephrotoxins -continue Foley Catheter-assess need daily  Acute exacerbation of COPD-moderate -Patient with thickened inspissated phlegm production with  increased volume and increased O2 requirement -Patient's saturation 84% initial arrival to ED -Solu-Medrol 40 twice daily with weaning/tapering protocol -Patient on vancomycin and Zosyn empirically for cellulitis as above -Bronchopulmonary hygiene with flutter valve and intensive spirometry   ID -continue IV abx as prescibed -follow up cultures  GI/Nutrition GI PROPHYLAXIS as indicated DIET-->TF's as tolerated Constipation protocol as indicated  ENDO - ICU hypoglycemic\Hyperglycemia protocol -check FSBS per protocol   ELECTROLYTES -follow labs as needed -replace as needed -pharmacy consultation   DVT/GI PRX ordered -SCDs  TRANSFUSIONS AS  NEEDED MONITOR FSBS ASSESS the need for LABS as needed   Critical care provider statement:   Total critical care time: 33 minutes   Performed by: Lanney Gins MD   Critical care time was exclusive of separately billable procedures and treating other patients.   Critical care was necessary to treat or prevent imminent or life-threatening deterioration.   Critical care was time spent personally by me on the following activities: development of treatment plan with patient and/or surrogate as well as nursing, discussions with consultants, evaluation of patient's response to treatment, examination of patient, obtaining history from patient or surrogate, ordering and performing treatments and interventions, ordering and review of laboratory studies, ordering and review of radiographic studies, pulse oximetry and re-evaluation of patient's condition.      This document was prepared using Dragon voice recognition software and may include unintentional dictation errors.    Ottie Glazier, M.D.  Division of [place]

## 2021-03-18 NOTE — ED Notes (Signed)
Pt appears more tired than he was initially. Like he is having a hard time staying awake. Pt awakens when spoken too and can answer all questions as asked, yet falls back asleep.

## 2021-03-18 NOTE — H&P (Addendum)
History and Physical    [name] [name] RJJ:[id number] DOB: 07-03-1960 DOA: 03/18/2021  Referring MD/NP/PA:   PCP: [name], MD   Patient coming from:  The patient is coming from home.  At baseline, pt is independent for most of ADL.        Chief Complaint: left leg pain  HPI: [name] [name] is a 60 y.o. male with medical history significant of hypertension, hyperlipidemia, diabetes mellitus, COPD, asthma, GERD, depression, varicose vein, tobacco abuse, syncope, right subclavian artery stenosis, OSA, PAF on Eliquis, PVD, sCHF with EF <20%, CAD, BPH, carotid artery stenosis, peripheral neuropathy, RLS, CKD stage IIIa, s/p of right BKA, who presents with leg pain.  Patient has chronic left leg ulcers and developed infection in left leg recently.  Patient was started on Keflex by vascular surgery on 10/6, without improvement.  Patient states that he has worsening pain in left leg.  Left leg is also erythematous and swollen.  He denies fever or chills.  He also reports left sided chest pain earlier today which is very mild per palpation.  He states that his chest pain has resolved resolved currently.  Patient has mild shortness of breath and mild cough with little mucus production.  Patient states that in the past 4 days, he has been having diarrhea, 2-3 times of watery diarrhea.  No nausea, vomiting or abdominal pain.  No symptoms of UTI.  Patient states that he took his last dose of Eliquis yesterday evening.  Initially patient's blood pressures are soft 92/64, which dropped to 85/59, then to  SBP of 70s  after giving 500 cc normal saline bolus in ED.  Due to EF <20%, did not give more IV fluid bolus.  Levophed was started in ED.  ED Course: pt was found to have WBC 5.4, lactic acid of 1.8, INR 1.2, troponin level 73, 68, BNP 905, pending COVID PCR, ESR 13, CRP 1.3, slightly worsening renal function, temperature normal, heart rate 59, RR 20, oxygen saturation 97% on room air.  Chest x-ray  showed chronic bronchitis change.  X-ray of left leg is negative for bony fractures.  Patient is admitted to ICU as inpatient. Dr, Lanney Gins is consulted.  Review of Systems:   General: no fevers, chills, no body weight gain, has fatigue HEENT: no blurry vision, hearing changes or sore throat Respiratory: has dyspnea, coughing, no wheezing CV: has chest pain, no palpitations GI: no nausea, vomiting, abdominal pain, has diarrhea, no constipation GU: no dysuria, burning on urination, increased urinary frequency, hematuria  Ext: has leg edema and pain Neuro: no unilateral weakness, numbness, or tingling, no vision change or hearing loss Skin: has ulcers in left leg MSK: No muscle spasm, no deformity, no limitation of range of movement in spin Heme: No easy bruising.  Travel history: No recent long distant travel.  Allergy:  Allergies  Allergen Reactions   Dulaglutide Anaphylaxis, Diarrhea and Hives   Ace Inhibitors     hypotension   Other Itching    Skin itching associated with nitro patch   Prednisone Rash    Past Medical History:  Diagnosis Date   Acute on chronic heart failure (HCC)    AKI (acute kidney injury) ([place]) 01/03/2018   Angina at rest [facility]) 08/13/2017   Arthritis    Asthma    Atherosclerosis    BPH (benign prostatic hyperplasia)    Carotid arterial disease (HCC)    Cellulitis of foot, left 09/27/2019   Charcot's joint of foot, right  Chronic combined systolic (congestive) and diastolic (congestive) heart failure ([place])    a. 02/2013 EF 50% by LV gram; b. 04/2017 Echo: EF 25-30%. diff HK. Gr2 DD; c. 06/2020 Echo: EF <20%, glob HK. RVS 53.36mmHg. Sev BAE. Mild to mod TR.   Chronic foot pain (Secondary Area of Pain) (Right) 01/29/2018   COPD (chronic obstructive pulmonary disease) (HCC)    COPD with acute exacerbation ([place]) 01/30/2020   Coronary artery disease    a. 2013 S/P PCI of LAD [facility]);  b. 03/2013 PCI: RCA 90p ( 2.5 x 23 mm DES); c. 02/2014 Cath: patent RCA  stent->Med Rx; d. 04/2017 Cath: LM nl, LAd 30ost/p, 31m/d, D1 80ost, LCX 90p/m, OM1 85, RCA 70/20p, 54m, 70d->Referred for CT Surg-felt to be poor candidate; d. 01/2020 Cath: LM nl, LAD 20p/m, 60d. D1 80. LCX 90p/m. LPAV 95, RCA 80/20p, 80p/m.   Diabetic neuropathy ([place])    Diabetic ulcer of right foot ([place])    Hernia    HFrEF (heart failure with reduced ejection fraction) ([place]) 01/30/2020   Hyperlipidemia    Hypertension    Ischemic cardiomyopathy    a. 02/2013 EF 50% by LV gram; b. 04/2017 Echo: EF 25-30%. diff HK. Gr2 DD; c. 06/2020 Echo: EF <20%, glob HK. RVS 53.26mmHg. Sev BAE. Mild to mod TR.   Morbid obesity ([place])    Neuropathy    PAD (peripheral artery disease) ([place])    a. Followed by Dr. Lucky Cowboy; b. 08/2010 Periph Angio: RSFA 70-80p (6X60 self-expanding stent); c. 09/2010 Periph Angio: L SFA 100p (7x unknown length self-expanding stent); d. 06/2015 Periph Angio: R SFA short segment occlusion (6x12 self-expanding stent).   Persistent atrial fibrillation (HCC)    Restless leg syndrome    Sepsis ([place]) 03/30/2018   Sleep apnea    Subclavian artery stenosis, right (HCC)    Syncope and collapse    Tobacco use    a. 75+ yr hx - still smoking 1ppd, down from 2 ppd.   Type II diabetes mellitus (HCC)    Unstable angina ([place]) 07/11/2016   Varicose vein     Past Surgical History:  Procedure Laterality Date   ABDOMINAL AORTAGRAM N/A 06/23/2013   Procedure: ABDOMINAL Maxcine Ham;  Surgeon: Wellington Hampshire, MD;  Location: [place] CATH LAB;  Service: Cardiovascular;  Laterality: N/A;   AMPUTATION Right 04/01/2018   Procedure: AMPUTATION BELOW KNEE;  Surgeon: Algernon Huxley, MD;  Location: [facility] ORS;  Service: Vascular;  Laterality: Right;   AMPUTATION TOE Left 08/11/2020   Procedure: AMPUTATION TOE-2nd Toe;  Surgeon: Samara Deist, DPM;  Location: [facility] ORS;  Service: Podiatry;  Laterality: Left;   APPENDECTOMY     CARDIAC CATHETERIZATION  10/14   [place]; X1 STENT PROXIMAL RCA   CARDIAC CATHETERIZATION  02/2010    [facility]   CARDIAC CAT[facility]TERIZATION  02/21/2014   [facility]   CLAVICLE SURGERY     HERNIA REPAIR     IRRIGATION AND DEBRIDEMENT FOOT Right 01/04/2018   Procedure: IRRIGATION AND DEBRIDEMENT FOOT;  Surgeon: Samara Deist, DPM;  Location: [facility] ORS;  Service: Podiatry;  Laterality: Right;   LEFT HEART CATH AND CORONARY ANGIOGRAPHY N/A 01/20/2020   Procedure: LEFT HEART CATH AND CORONARY ANGIOGRAPHY;  Surgeon: Wellington Hampshire, MD;  Location: [place] CV LAB;  Service: Cardiovascular;  Laterality: N/A;   LEFT HEART CATH AND CORS/GRAFTS ANGIOGRAPHY N/A 04/28/2017   Procedure: LEFT HEART CATH AND CORONARY ANGIOGRAPHY;  Surgeon: Wellington Hampshire, MD;  Location: [place] CV LAB;  Service: Cardiovascular;  Laterality: N/A;   LOWER EXTREMITY ANGIOGRAPHY Right 03/03/2018   Procedure: LOWER EXTREMITY ANGIOGRAPHY;  Surgeon: Katha Cabal, MD;  Location: [place] CV LAB;  Service: Cardiovascular;  Laterality: Right;   LOWER EXTREMITY ANGIOGRAPHY Left 08/24/2018   Procedure: LOWER EXTREMITY ANGIOGRAPHY;  Surgeon: Algernon Huxley, MD;  Location: [place] CV LAB;  Service: Cardiovascular;  Laterality: Left;   LOWER EXTREMITY ANGIOGRAPHY Left 06/14/2019   Procedure: LOWER EXTREMITY ANGIOGRAPHY;  Surgeon: Algernon Huxley, MD;  Location: [place] CV LAB;  Service: Cardiovascular;  Laterality: Left;   LOWER EXTREMITY ANGIOGRAPHY Left 08/02/2019   Procedure: LOWER EXTREMITY ANGIOGRAPHY;  Surgeon: Algernon Huxley, MD;  Location: [place] CV LAB;  Service: Cardiovascular;  Laterality: Left;   LOWER EXTREMITY ANGIOGRAPHY Left 07/17/2020   Procedure: LOWER EXTREMITY ANGIOGRAPHY;  Surgeon: Algernon Huxley, MD;  Location: [place] CV LAB;  Service: Cardiovascular;  Laterality: Left;   PERIPHERAL ARTERIAL STENT GRAFT     x2 left/right   PERIPHERAL VASCULAR BALLOON ANGIOPLASTY Right 01/06/2018   Procedure: PERIPHERAL VASCULAR BALLOON ANGIOPLASTY;  Surgeon: Katha Cabal, MD;  Location: [place] CV  LAB;  Service: Cardiovascular;  Laterality: Right;   PERIPHERAL VASCULAR CATHETERIZATION N/A 07/06/2015   Procedure: Abdominal Aortogram w/Lower Extremity;  Surgeon: Algernon Huxley, MD;  Location: [place] CV LAB;  Service: Cardiovascular;  Laterality: N/A;   PERIPHERAL VASCULAR CATHETERIZATION  07/06/2015   Procedure: Lower Extremity Intervention;  Surgeon: Algernon Huxley, MD;  Location: [place] CV LAB;  Service: Cardiovascular;;    Social History:  reports that he has been smoking cigarettes. He has a 41.00 pack-year smoking history. He has never used smokeless tobacco. He reports that he does not drink alcohol and does not use drugs.  Family History:  Family History  Problem Relation Age of Onset   Heart attack Father    Heart disease Father    Hypertension Father    Hyperlipidemia Father    Hypertension Mother    Hyperlipidemia Mother      Prior to Admission medications   Medication Sig Start Date End Date Taking? Authorizing Provider  albuterol (VENTOLIN HFA) 108 (90 Base) MCG/ACT inhaler Inhale into the lungs every 6 (six) hours as needed for wheezing or shortness of breath.    [provider]  atorvastatin (LIPITOR) 80 MG tablet Take 1 tablet (80 mg total) by mouth daily. 10/09/20   Minna Merritts, MD  carvedilol (COREG) 3.125 MG tablet TAKE 1 TABLET BY MOUTH TWICE DAILY WITH MEALS 03/16/21   Gollan, Kathlene November, MD  cephALEXin (KEFLEX) 500 MG capsule Take 1 capsule (500 mg total) by mouth 3 (three) times daily. 03/15/21   Kris Hartmann, NP  clopidogrel (PLAVIX) 75 MG tablet Take 1 tablet (75 mg total) by mouth daily. 10/09/20   Minna Merritts, MD  DULoxetine (CYMBALTA) 60 MG capsule Take 60 mg by mouth daily.    [provider]  ELIQUIS 5 MG TABS tablet Take 5 mg by mouth 2 (two) times daily. 02/02/20   [provider]  empagliflozin (JARDIANCE) 10 MG TABS tablet Take 10 mg by mouth daily.  02/03/20   [provider]  ezetimibe (ZETIA) 10 MG  tablet Take 1 tablet (10 mg total) by mouth daily. 10/09/20   Minna Merritts, MD  gabapentin (NEURONTIN) 800 MG tablet Take 800 mg by mouth 4 (four) times daily.  08/14/17 04/04/29  [provider]  ipratropium-albuterol (DUONEB) 0.5-2.5 (3) MG/3ML SOLN Inhale 3  mLs into the lungs every 6 (six) hours as needed (wheezing/sob).     [provider]  isosorbide mononitrate (IMDUR) 30 MG 24 hr tablet Take 1 tablet (30 mg total) by mouth daily. 10/09/20   Minna Merritts, MD  losartan (COZAAR) 25 MG tablet Take 0.5 tablets (12.5 mg total) by mouth daily. 10/09/20   Minna Merritts, MD  metFORMIN (GLUCOPHAGE-XR) 500 MG 24 hr tablet Take 1,000 mg by mouth 2 (two) times daily. 01/02/21   [provider]  metolazone (ZAROXOLYN) 2.5 MG tablet Take 1 tablet (2.5 mg total) by mouth daily as needed. Take 1/2 hour before morning torsemide 01/23/21 04/23/21  Alisa Graff, FNP  Multiple Vitamin (MULTI-VITAMINS) TABS Take 1 tablet by mouth daily.     [provider]  nitroGLYCERIN (NITROSTAT) 0.4 MG SL tablet Place 1 tablet (0.4 mg total) under the tongue every 5 (five) minutes as needed for chest pain. 10/09/20   Minna Merritts, MD  omeprazole (PRILOSEC) 40 MG capsule Take 40 mg by mouth daily.    [provider]  Oxycodone HCl 10 MG TABS Take 10 mg by mouth every 6 (six) hours as needed (pain).  01/15/18   [provider]  polyethylene glycol (MIRALAX / GLYCOLAX) 17 g packet Take 17 g by mouth daily as needed for mild constipation. 08/12/20   Loletha Grayer, MD  potassium chloride (KLOR-CON) 10 MEQ tablet Take 1 tablet (10 mEq total) by mouth 2 (two) times daily. 10/09/20   Minna Merritts, MD  spironolactone (ALDACTONE) 25 MG tablet Take 0.5 tablets (12.5 mg total) by mouth daily. 10/09/20   Minna Merritts, MD  SYMBICORT 80-4.5 MCG/ACT inhaler Inhale 1 puff into the lungs 2 (two) times daily. 01/01/20   [provider]  tamsulosin (FLOMAX) 0.4 MG CAPS  capsule Take 1 capsule (0.4 mg total) by mouth daily after supper. 07/01/20   Shawna Clamp, MD  torsemide (DEMADEX) 20 MG tablet Take 40 mg by mouth daily.    [provider]  traZODone (DESYREL) 150 MG tablet Take 150 mg by mouth at bedtime. 07/12/19   [provider]  TRELEGY ELLIPTA 100-62.5-25 MCG/INH AEPB Inhale 1 puff into the lungs daily. 01/26/21   [provider]  VERQUVO 10 MG TABS TAKE 1 TABLET BY MOUTH ONCE DAILY 11/16/20   Alisa Graff, FNP    Physical Exam: Vitals:   03/18/21 1525 03/18/21 1530 03/18/21 1600 03/18/21 1639  BP: 92/72 (!) 82/61 (!) 83/68   Pulse:      Resp: $Remo'18 18 14 18  'YULAK$ Temp:    97.7 F (36.5 C)  TempSrc:    Oral  SpO2:   95% 98%  Weight:    84.3 kg  Height:       General: Not in acute distress HEENT:       Eyes: PERRL, EOMI, no scleral icterus.       ENT: No discharge from the ears and nose, no pharynx injection, no tonsillar enlargement.        Neck: No JVD, no bruit, no mass felt. Heme: No neck lymph node enlargement. Cardiac: S1/S2, RRR, No murmurs, No gallops or rubs. Respiratory: No rales, wheezing, rhonchi or rubs. GI: Soft, nondistended, nontender, no rebound pain, no organomegaly, BS present. GU: No hematuria Ext: s/p of right BKA.  Has erythema, tenderness, warmth, swelling in left lower leg. Has a few scabbed small ulcers. Left second toe is missing.     Musculoskeletal: No joint  deformities, No joint redness or warmth, no limitation of ROM in spin. Skin: has ulcers in left leg  Neuro: Alert, oriented X3, cranial nerves II-XII grossly intact, moves all extremities normally.  Psych: Patient is not psychotic, no suicidal or hemocidal ideation.  Labs on Admission: I have personally reviewed following labs and imaging studies  CBC: Recent Labs  Lab 03/18/21 1110  WBC 5.4  HGB 11.2*  HCT 32.6*  MCV 81.5  PLT 885   Basic Metabolic Panel: Recent Labs  Lab 03/18/21 1110  NA 130*  K 4.2  CL 89*  CO2 31   GLUCOSE 90  BUN 23*  CREATININE 1.47*  CALCIUM 8.5*   GFR: Estimated Creatinine Clearance: 56.9 mL/min (A) (by C-G formula based on SCr of 1.47 mg/dL (H)). Liver Function Tests: No results for input(s): AST, ALT, ALKPHOS, BILITOT, PROT, ALBUMIN in the last 168 hours. No results for input(s): LIPASE, AMYLASE in the last 168 hours. No results for input(s): AMMONIA in the last 168 hours. Coagulation Profile: Recent Labs  Lab 03/18/21 1201  INR 1.2   Cardiac Enzymes: No results for input(s): CKTOTAL, CKMB, CKMBINDEX, TROPONINI in the last 168 hours. BNP (last 3 results) No results for input(s): PROBNP in the last 8760 hours. HbA1C: No results for input(s): HGBA1C in the last 72 hours. CBG: Recent Labs  Lab 03/18/21 1646  GLUCAP 116*   Lipid Profile: No results for input(s): CHOL, HDL, LDLCALC, TRIG, CHOLHDL, LDLDIRECT in the last 72 hours. Thyroid Function Tests: No results for input(s): TSH, T4TOTAL, FREET4, T3FREE, THYROIDAB in the last 72 hours. Anemia Panel: No results for input(s): VITAMINB12, FOLATE, FERRITIN, TIBC, IRON, RETICCTPCT in the last 72 hours. Urine analysis:    Component Value Date/Time   COLORURINE YELLOW (A) 08/22/2020 1845   APPEARANCEUR CLEAR (A) 08/22/2020 1845   APPEARANCEUR Clear 02/06/2014 1412   LABSPEC 1.008 08/22/2020 1845   LABSPEC 1.015 02/06/2014 1412   PHURINE 6.0 08/22/2020 1845   GLUCOSEU >=500 (A) 08/22/2020 1845   GLUCOSEU Negative 02/06/2014 1412   HGBUR SMALL (A) 08/22/2020 1845   BILIRUBINUR NEGATIVE 08/22/2020 1845   BILIRUBINUR Negative 02/06/2014 1412   KETONESUR NEGATIVE 08/22/2020 1845   PROTEINUR NEGATIVE 08/22/2020 1845   NITRITE NEGATIVE 08/22/2020 1845   LEUKOCYTESUR NEGATIVE 08/22/2020 1845   LEUKOCYTESUR Negative 02/06/2014 1412   Sepsis Labs: $[id number]'@[id number]$ (procalcitonin:4,lacticidven:4) ) Recent Results (from the past 240 hour(s))  Resp Panel by RT-PCR (Flu A&B, Covid) Nasopharyngeal Swab     Status: None    Collection Time: 03/18/21  2:43 PM   Specimen: Nasopharyngeal Swab; Nasopharyngeal(NP) swabs in vial transport medium  Result Value Ref Range Status   SARS Coronavirus 2 by RT PCR NEGATIVE NEGATIVE Final    Comment: (NOTE) SARS-CoV-2 target nucleic acids are NOT DETECTED.  The SARS-CoV-2 RNA is generally detectable in upper respiratory specimens during the acute phase of infection. The lowest concentration of SARS-CoV-2 viral copies this assay can detect is 138 copies/mL. A negative result does not preclude SARS-Cov-2 infection and should not be used as the sole basis for treatment or other patient management decisions. A negative result may occur with  improper specimen collection/handling, submission of specimen other than nasopharyngeal swab, presence of viral mutation(s) within the areas targeted by this assay, and inadequate number of viral copies(<138 copies/mL). A negative result must be combined with clinical observations, patient history, and epidemiological information. The expected result is Negative.  Fact Sheet for Patients:  EntrepreneurPulse.com.au  Fact Sheet for Healthcare Providers:  IncredibleEmployment.be  This test is no t yet approved or cleared by the [place] and  has been authorized for detection and/or diagnosis of SARS-CoV-2 by FDA under an Emergency Use Authorization (EUA). This EUA will remain  in effect (meaning this test can be used) for the duration of the COVID-19 declaration under Section 564(b)(1) of the Act, 21 U.S.C.section 360bbb-3(b)(1), unless the authorization is terminated  or revoked sooner.       Influenza A by PCR NEGATIVE NEGATIVE Final   Influenza B by PCR NEGATIVE NEGATIVE Final    Comment: (NOTE) The Xpert Xpress SARS-CoV-2/FLU/RSV plus assay is intended as an aid in the diagnosis of influenza from Nasopharyngeal swab specimens and should not be used as a sole basis for treatment.  Nasal washings and aspirates are unacceptable for Xpert Xpress SARS-CoV-2/FLU/RSV testing.  Fact Sheet for Patients: EntrepreneurPulse.com.au  Fact Sheet for Healthcare Providers: IncredibleEmployment.be  This test is not yet approved or cleared by the [place] FDA and has been authorized for detection and/or diagnosis of SARS-CoV-2 by FDA under an Emergency Use Authorization (EUA). This EUA will remain in effect (meaning this test can be used) for the duration of the COVID-19 declaration under Section 564(b)(1) of the Act, 21 U.S.C. section 360bbb-3(b)(1), unless the authorization is terminated or revoked.  Performed at [facility], [address] [place], [place] [address]      Radiological Exams on Admission: DG Chest 2 View  Result Date: 03/18/2021 CLINICAL DATA:  60 year old male with chest pain EXAM: CHEST - 2 VIEW COMPARISON:  01/19/2021 FINDINGS: Cardiomediastinal silhouette unchanged with cardiomegaly. Calcifications of the aortic arch. Increased peribronchial thickening perceived at the bilateral hilar region. Similar appearance of coarsened interstitial markings. No interlobular septal thickening. No new confluent airspace disease. No pneumothorax. No large pleural effusion. No displaced fracture.  Degenerative changes of the spine IMPRESSION: Similar chronic changes and bronchial wall thickening, without evidence of acute cardiopulmonary disease Electronically Signed   By: Corrie Mckusick D.O.   On: 03/18/2021 11:50   DG Tibia/Fibula Left  Result Date: 03/18/2021 CLINICAL DATA:  Wound infection of the left calf 6 days. EXAM: LEFT TIBIA AND FIBULA - 2 VIEW COMPARISON:  None. FINDINGS: There is no evidence of fracture or dislocation. There is no osteopenia or bony destruction identified to suggest osteomyelitis. Soft tissues are unremarkable. IMPRESSION: No acute abnormality. Electronically Signed   By: Abelardo Diesel M.D.   On:  03/18/2021 12:31     EKG: I have personally reviewed.  Sinus rhythm, QTC 453, bifascicular block, poor R wave progression, T wave inversion in lateral leads.  Assessment/Plan Principal Problem:   Cellulitis of left lower extremity Active Problems:   HLD (hyperlipidemia)   Hypertension   Tobacco use   Chest pain   Chronic systolic CHF (congestive heart failure) (HCC)   COPD (chronic obstructive pulmonary disease) (HCC)   CAD (coronary artery disease)   PVD (peripheral vascular disease) (HCC)   Chronic a-fib (HCC)   BPH (benign prostatic hyperplasia)   Depression   CKD (chronic kidney disease), stage IIIa   Elevated troponin   Diarrhea   Chronic ulcer of left leg (HCC)   Hypotension   Cellulitis of left lower extremity and hypotension: Patient has hypotension, after giving 500 cc normal saline, blood pressure is still in 70s.  Patient does not have fever or leukocytosis.  No tachycardia or tachypnea, lactic acid is normal 1.8, does not meet criteria for sepsis.  Does not seem to have septic shock,  likely due to low EF which is <20.  Patient is on Eliquis, low suspicions of DVT. Levophed is started in ED. Cannot give aggressive IV fluid due to low EF. Dr,. Aleskerov of ICU is consulted.  - will admit to ICU as inpatient - On Levophed gtt - Empiric antimicrobial treatment with vancomycin and Rocephin (pt received one dose of zosyn in ED) - PRN Zofran for nausea, tylenol and Percocet for pain - Blood cultures x 2  - ESR and CRP - IVF: 500 cc of NS in ED  HLD (hyperlipidemia) -Lipitor, Zetia  Hypertension: Patient has hypotension -Hold all blood pressure medications: Including Coreg, Cozaar, diuretics (spironolactone, metolazone, torsemide) -Also hold Flomax  Tobacco use -Nicotine patch  Chest pain, history of CAD and elevated troponin: Patient states that he had a very mild chest pain earlier, which has resolved.  Troponin level 73 --> 68.  Likely due to demand  ischemia. -Continue Plavix, Zetia, Lipitor -Trend troponin -Check A1c, FLP  Chronic systolic CHF (congestive heart failure) ([place]): 2D echo on 06/17/2020 showed EF <20%.  Patient has left leg edema, but no JVD.  Chest x-ray is negative for infiltration.  Patient does not seem to have CHF exacerbation, but BNP is elevated 905, pt is at high risk of developing CHF exacerbation.  Due to hypotension, will hold diuretics. -Hold diuretics and Verquvo  COPD (chronic obstructive pulmonary disease) (HCC) -Bronchodilators  PVD (peripheral vascular disease) (HCC) -Plavix, Zetia, Lipitor  Chronic a-fib ([place]): Heart rate 59 -Continue Eliquis -Hold Coreg -continue digoxin  BPH (benign prostatic hyperplasia) -Hold Flomax due to hypotension  Depression -Continue home medications  CKD (chronic kidney disease), stage IIIa: Slightly worsening than baseline.  Baseline creatinine 1.1-1.3.  His creatinine is 1.47, BUN 23 -Watch volume status closely -Hold diuretics -Patient received 500 cc normal saline  Diarrhea -Check C. difficile and GI pathogen panel  Chronic ulcer of left leg (HCC) -Wound care consult  Diabetes with renal complications: Recent O0H 6.5, well controlled.  Patient is taking metformin and Jardiance -Sliding scale insulin         DVT ppx: on eliquis Code Status: Full code Family Communication:  Yes, patient's girlfriend by phone Disposition Plan:  Anticipate discharge back to previous environment Consults called: Dr. Lanney Gins of ICU Admission status and Level of care: ICU:   as inpation         Status is: Inpatient  Remains inpatient appropriate because:Inpatient level of care appropriate due to severity of illness  Dispo: The patient is from: Home              Anticipated d/c is to: Home              Patient currently is not medically stable to d/c.   Difficult to place patient No           Date of Service 03/18/2021    [facility]   If 7PM-7AM, please contact night-coverage [email or web address] 03/18/2021, 5:09 PM

## 2021-03-19 DIAGNOSIS — I482 Chronic atrial fibrillation, unspecified: Secondary | ICD-10-CM

## 2021-03-19 DIAGNOSIS — L03116 Cellulitis of left lower limb: Principal | ICD-10-CM

## 2021-03-19 DIAGNOSIS — I9589 Other hypotension: Secondary | ICD-10-CM

## 2021-03-19 DIAGNOSIS — I5022 Chronic systolic (congestive) heart failure: Secondary | ICD-10-CM

## 2021-03-19 LAB — BASIC METABOLIC PANEL
Anion gap: 10 (ref 5–15)
BUN: 24 mg/dL — ABNORMAL HIGH (ref 6–20)
CO2: 29 mmol/L (ref 22–32)
Calcium: 8.9 mg/dL (ref 8.9–10.3)
Chloride: 88 mmol/L — ABNORMAL LOW (ref 98–111)
Creatinine, Ser: 1.44 mg/dL — ABNORMAL HIGH (ref 0.61–1.24)
GFR, Estimated: 56 mL/min — ABNORMAL LOW (ref >60)
Glucose, Bld: 178 mg/dL — ABNORMAL HIGH (ref 70–99)
Potassium: 5.2 mmol/L — ABNORMAL HIGH (ref 3.5–5.1)
Sodium: 127 mmol/L — ABNORMAL LOW (ref 135–145)

## 2021-03-19 LAB — LIPID PANEL
Cholesterol: 68 mg/dL (ref 0–200)
HDL: 34 mg/dL — ABNORMAL LOW (ref >40)
LDL Cholesterol: 27 mg/dL (ref 0–99)
Total CHOL/HDL Ratio: 2 RATIO
Triglycerides: 36 mg/dL (ref <150)
VLDL: 7 mg/dL (ref 0–40)

## 2021-03-19 LAB — GLUCOSE, CAPILLARY
Glucose-Capillary: 193 mg/dL — ABNORMAL HIGH (ref 70–99)
Glucose-Capillary: 287 mg/dL — ABNORMAL HIGH (ref 70–99)
Glucose-Capillary: 199 mg/dL — ABNORMAL HIGH (ref 70–99)
Glucose-Capillary: 240 mg/dL — ABNORMAL HIGH (ref 70–99)

## 2021-03-19 LAB — CBC
HCT: 36.2 % — ABNORMAL LOW (ref 39.0–52.0)
Hemoglobin: 12.7 g/dL — ABNORMAL LOW (ref 13.0–17.0)
MCH: 27.9 pg (ref 26.0–34.0)
MCHC: 35.1 g/dL (ref 30.0–36.0)
MCV: 79.6 fL — ABNORMAL LOW (ref 80.0–100.0)
Platelets: 184 10*3/uL (ref 150–400)
RBC: 4.55 MIL/uL (ref 4.22–5.81)
RDW: 21.3 % — ABNORMAL HIGH (ref 11.5–15.5)
WBC: 5.5 10*3/uL (ref 4.0–10.5)
nRBC: 0 % (ref 0.0–0.2)

## 2021-03-19 LAB — PHOSPHORUS: Phosphorus: 4.2 mg/dL (ref 2.5–4.6)

## 2021-03-19 LAB — HEMOGLOBIN A1C
Hgb A1c MFr Bld: 6.2 % — ABNORMAL HIGH (ref 4.8–5.6)
Mean Plasma Glucose: 131 mg/dL

## 2021-03-19 LAB — MAGNESIUM: Magnesium: 2.4 mg/dL (ref 1.7–2.4)

## 2021-03-19 LAB — HIV ANTIBODY (ROUTINE TESTING W REFLEX): HIV Screen 4th Generation wRfx: NONREACTIVE

## 2021-03-19 MED ORDER — METHYLPREDNISOLONE SODIUM SUCC 40 MG IJ SOLR
40.0000 mg | INTRAMUSCULAR | Status: DC
  Administered 2021-03-20 – 2021-03-21 (×2): 40 mg via INTRAVENOUS
  Filled 2021-03-19 (×2): qty 1

## 2021-03-19 MED ORDER — TORSEMIDE 20 MG PO TABS
40.0000 mg | ORAL_TABLET | Freq: Every day | ORAL | Status: DC
  Administered 2021-03-19 – 2021-03-21 (×3): 40 mg via ORAL
  Filled 2021-03-19 (×3): qty 2

## 2021-03-19 MED ORDER — OXYCODONE-ACETAMINOPHEN 5-325 MG PO TABS
2.0000 | ORAL_TABLET | ORAL | Status: DC | PRN
  Administered 2021-03-19 – 2021-03-21 (×10): 2 via ORAL
  Filled 2021-03-19 (×10): qty 2

## 2021-03-19 NOTE — Progress Notes (Signed)
Pharmacy Antibiotic Note  [name] [name] is a 60 y.o. male admitted on 03/18/2021 with cellulitis.  Pharmacy has been consulted for vancomycin dosing.  Left calf cellulitis with oozing. PMH significant for T2DM, COPD, HTN. Hx of MRSA infection in wound on 05/06/2016. Creatinine bump of 1.47 above baseline (~1.30). Received vancomycin 1,000 mg x 1 in the ED.    Plan: Scr stable 1.47>1.44; Wt 78.9>>84.3>87.3kg (planning to restart diuresis) Received 2g Vancomycin loading dose; then maintenance dose vancomycin 1250 mg every 24 hours.  Calculated AUC 460.4 (Goal AUC 400-550) Cmax 32.2, Cmin 11.0 Vd 0.72; IBW dosing; Scr 1.44  Continue ceftriaxone 1 gram every 24 hours  Monitor renal function, clinical course, and length of thearpy.  Height: '5\' 7"'$  (170.2 cm) Weight: 87.3 kg (192 lb 7.4 oz) IBW/kg (Calculated) : 66.1  Temp (24hrs), Avg:98 F (36.7 C), Min:97.7 F (36.5 C), Max:98.4 F (36.9 C)  Recent Labs  Lab 03/18/21 1110 03/18/21 1201 03/19/21 0505  WBC 5.4  --  5.5  CREATININE 1.47*  --  1.44*  LATICACIDVEN  --  1.8  --      Estimated Creatinine Clearance: 59 mL/min (A) (by C-G formula based on SCr of 1.44 mg/dL (H)).    Allergies  Allergen Reactions   Dulaglutide Anaphylaxis, Diarrhea and Hives   Ace Inhibitors     hypotension   Other Itching    Skin itching associated with nitro patch   Prednisone Rash    Antimicrobials this admission: Vancomycin 10/9 >>  ceftriaxone 10/9 >>   Dose adjustments this admission: None  Microbiology results: 10/9 BCx: Ng24h 10/9 Resp PCR: negative 10/9 MRSA PCR/FLU/COVID: negative  Thank you for allowing pharmacy to be a part of this patient's care.  Lorna Dibble, [facility], [facility] Clinical Pharmacist 03/19/2021 2:22 PM

## 2021-03-19 NOTE — Consult Note (Signed)
WOC Nurse Consult Note: Patient receiving care in [facility]. Reason for Consult: LLE wounds Wound type: Etiology of wounds is not clear to me at this time. They are NOT in a place of typical pressure injuries. An updated ABI for the LLE may be helpful in determining if these are arterial in nature.  At any rate, it may be preferable to keep these dry and stable, rather than adding moisture to the wound beds. Pressure Injury POA: Yes/No/NA Measurement: Wound bed: see photo, darkened wound beds Drainage (amount, consistency, odor)  Periwound: Dressing procedure/placement/frequency: Apply iodine from the swabsticks or swab pads from clean utility to LLE wounds.  Allow to air dry. Place foot in Prevalon heel lift boot.   Monitor the wound area(s) for worsening of condition such as: Signs/symptoms of infection,  Increase in size,  Development of or worsening of odor, Development of pain, or increased pain at the affected locations.  Notify the medical team if any of these develop.  Thank you for the consult. [facility] nurse will not follow at this time.  Please re-consult the [place] team if needed.  Val Riles, RN, MSN, CWOCN, CNS-BC, pager [phone]

## 2021-03-19 NOTE — Progress Notes (Addendum)
PROGRESS NOTE    [name] [name]  [id number] DOB: [birth year] DOA: 03/18/2021 PCP: [name], MD   Left leg pain. Brief Narrative:   [name] [name] is a 60 y.o. male with medical history significant of hypertension, hyperlipidemia, diabetes mellitus, COPD, asthma, GERD, depression, varicose vein, tobacco abuse, syncope, right subclavian artery stenosis, OSA, PAF on Eliquis, PVD, sCHF with EF <20%, CAD, BPH, carotid artery stenosis, peripheral neuropathy, RLS, CKD stage IIIa, s/p of right BKA, who presents with leg pain. Patient had a significant leg redness, was diagnosed with acute cellulitis.  Was placed on vancomycin and Rocephin. Patient has significant hypotension, does not meet sepsis criteria, was placed on Levophed.  Blood pressure much better today, able to wean off today.  Assessment & Plan:   Principal Problem:   Cellulitis of left lower extremity Active Problems:   HLD (hyperlipidemia)   Hypertension   Tobacco use   Chest pain   Chronic systolic CHF (congestive heart failure) (HCC)   COPD (chronic obstructive pulmonary disease) (HCC)   CAD (coronary artery disease)   PVD (peripheral vascular disease) (HCC)   Chronic a-fib (HCC)   BPH (benign prostatic hyperplasia)   Depression   CKD (chronic kidney disease), stage IIIa   Elevated troponin   Diarrhea   Chronic ulcer of left leg (HCC)   Hypotension  Left lower extremity cellulitis Hypotension. Patient did not meet sepsis criteria, sepsis ruled out. Blood culture still pending. Continue vancomycin and Rocephin.  Chronic systolic congestive heart failure. Hyponatremia secondary to SIADH. Patient blood pressure much better today, I will restart home dose torsemide.  Recheck a BMP tomorrow.  Chest pain Mild elevation troponin secondary to chronic systolic congestive heart failure. I had a long discussion with the patient, patient has been having chest pain for at least a month, localized in left upper chest,  each episode lasted a few seconds, mild in severity.  It also feels like pinching.  Chest pain unlikely to be cardiac in nature.  We will follow-up with PCP as outpatient.  COPD exacerbation. Patient has been seen by pulmonology, patient has increased mucus production.  Steroids started, I will reduce dose to Solu-Medrol 40 mg daily. Patient currently on 2 L oxygen, will wean off.  Chronic atrial fibrillation. Resume Eliquis.  Coreg on hold due to low blood pressure.  Type 2 diabetes with renal complication. Continue sliding scale insulin.  Glucose well controlled.  Chronic kidney disease stage IIIa. Mild hyperkalemia. Started torsemide, recheck BMP in am.   DVT prophylaxis: Eliquis Code Status: full Family Communication:  Disposition Plan:    Status is: Inpatient  Remains inpatient appropriate because:Hemodynamically unstable and Inpatient level of care appropriate due to severity of illness  Dispo: The patient is from: Home              Anticipated d/c is to: Home              Patient currently is not medically stable to d/c.   Difficult to place patient No        I/O last 3 completed shifts: In: 1237.4 [I.V.:258.1; IV Piggyback:979.3] Out: 750 [Urine:750] No intake/output data recorded.     Consultants:  Pulm  Procedures: None  Antimicrobials: Rocephin and vancomycin.  Subjective: Patient left leg pain seems to be better today, no fever or chills. Denies any short of breath.  Overnight, he was placed on 2 L oxygen.  He has a cough, with clear mucus. No fever or chills. No  dysuria hematuria.    Objective: Vitals:   03/19/21 0812 03/19/21 0828 03/19/21 0923 03/19/21 1000  BP: 119/89 112/65 104/70 110/66  Pulse: 87 95 71 70  Resp: (!) 28 (!) '23 14 18  '$ Temp:      TempSrc:      SpO2: 96% 97% 94% 95%  Weight:      Height:        Intake/Output Summary (Last 24 hours) at 03/19/2021 1016 Last data filed at 03/19/2021 0600 Gross per 24 hour  Intake  1237.43 ml  Output 750 ml  Net 487.43 ml   Filed Weights   03/18/21 1101 03/18/21 1639 03/19/21 0500  Weight: 78.9 kg 84.3 kg 87.3 kg    Examination:  General exam: Appears calm and comfortable  Respiratory system: Creased breathing sounds with some crackles in the left lower lobe. Respiratory effort normal. Cardiovascular system: Irregular. No JVD, murmurs, rubs, gallops or clicks.  Gastrointestinal system: Abdomen is nondistended, soft and nontender. No organomegaly or masses felt. Normal bowel sounds heard. Central nervous system: Alert and oriented. No focal neurological deficits. Extremities: Right BKA, left leg redness much better today, no swelling. Skin: No rashes, lesions or ulcers Psychiatry: Mood & affect appropriate.     Data Reviewed: I have personally reviewed following labs and imaging studies  CBC: Recent Labs  Lab 03/18/21 1110 03/19/21 0505  WBC 5.4 5.5  HGB 11.2* 12.7*  HCT 32.6* 36.2*  MCV 81.5 79.6*  PLT 164 [id number]   Basic Metabolic Panel: Recent Labs  Lab 03/18/21 1110 03/18/21 2020 03/19/21 0505  NA 130*  --  127*  K 4.2  --  5.2*  CL 89*  --  88*  CO2 31  --  29  GLUCOSE 90  --  178*  BUN 23*  --  24*  CREATININE 1.47*  --  1.44*  CALCIUM 8.5*  --  8.9  MG  --  1.3* 2.4  PHOS  --  4.3 4.2   GFR: Estimated Creatinine Clearance: 59 mL/min (A) (by C-G formula based on SCr of 1.44 mg/dL (H)). Liver Function Tests: No results for input(s): AST, ALT, ALKPHOS, BILITOT, PROT, ALBUMIN in the last 168 hours. No results for input(s): LIPASE, AMYLASE in the last 168 hours. No results for input(s): AMMONIA in the last 168 hours. Coagulation Profile: Recent Labs  Lab 03/18/21 1201  INR 1.2   Cardiac Enzymes: No results for input(s): CKTOTAL, CKMB, CKMBINDEX, TROPONINI in the last 168 hours. BNP (last 3 results) No results for input(s): PROBNP in the last 8760 hours. HbA1C: Recent Labs    03/18/21 1110  HGBA1C 6.2*   CBG: Recent Labs   Lab 03/18/21 1646 03/18/21 2244 03/19/21 0746  GLUCAP 116* 172* 193*   Lipid Profile: Recent Labs    03/19/21 0505  CHOL 68  HDL 34*  LDLCALC 27  TRIG 36  CHOLHDL 2.0   Thyroid Function Tests: No results for input(s): TSH, T4TOTAL, FREET4, T3FREE, THYROIDAB in the last 72 hours. Anemia Panel: No results for input(s): VITAMINB12, FOLATE, FERRITIN, TIBC, IRON, RETICCTPCT in the last 72 hours. Sepsis Labs: Recent Labs  Lab 03/18/21 1201  LATICACIDVEN 1.8    Recent Results (from the past 240 hour(s))  Culture, blood (routine x 2)     Status: None (Preliminary result)   Collection Time: 03/18/21 12:01 PM   Specimen: BLOOD  Result Value Ref Range Status   Specimen Description BLOOD LEFT FA  Final   Special Requests   Final  BOTTLES DRAWN AEROBIC AND ANAEROBIC Blood Culture adequate volume   Culture   Final    NO GROWTH < 24 HOURS Performed at [facility], [address] [place], [place] [address]    Report Status PENDING  Incomplete  Culture, blood (routine x 2)     Status: None (Preliminary result)   Collection Time: 03/18/21 12:02 PM   Specimen: BLOOD  Result Value Ref Range Status   Specimen Description BLOOD RIGHT FA  Final   Special Requests   Final    BOTTLES DRAWN AEROBIC AND ANAEROBIC Blood Culture adequate volume   Culture   Final    NO GROWTH < 24 HOURS Performed at [facility], [address], [place], [place] [address]    Report Status PENDING  Incomplete  Resp Panel by RT-PCR (Flu A&B, Covid) Nasopharyngeal Swab     Status: None   Collection Time: 03/18/21  2:43 PM   Specimen: Nasopharyngeal Swab; Nasopharyngeal(NP) swabs in vial transport medium  Result Value Ref Range Status   SARS Coronavirus 2 by RT PCR NEGATIVE NEGATIVE Final    Comment: (NOTE) SARS-CoV-2 target nucleic acids are NOT DETECTED.  The SARS-CoV-2 RNA is generally detectable in upper respiratory specimens during the acute phase of infection. The  lowest concentration of SARS-CoV-2 viral copies this assay can detect is 138 copies/mL. A negative result does not preclude SARS-Cov-2 infection and should not be used as the sole basis for treatment or other patient management decisions. A negative result may occur with  improper specimen collection/handling, submission of specimen other than nasopharyngeal swab, presence of viral mutation(s) within the areas targeted by this assay, and inadequate number of viral copies(<138 copies/mL). A negative result must be combined with clinical observations, patient history, and epidemiological information. The expected result is Negative.  Fact Sheet for Patients:  EntrepreneurPulse.com.au  Fact Sheet for Healthcare Providers:  IncredibleEmployment.be  This test is no t yet approved or cleared by the [place] FDA and  has been authorized for detection and/or diagnosis of SARS-CoV-2 by FDA under an Emergency Use Authorization (EUA). This EUA will remain  in effect (meaning this test can be used) for the duration of the COVID-19 declaration under Section 564(b)(1) of the Act, 21 U.S.C.section 360bbb-3(b)(1), unless the authorization is terminated  or revoked sooner.       Influenza A by PCR NEGATIVE NEGATIVE Final   Influenza B by PCR NEGATIVE NEGATIVE Final    Comment: (NOTE) The Xpert Xpress SARS-CoV-2/FLU/RSV plus assay is intended as an aid in the diagnosis of influenza from Nasopharyngeal swab specimens and should not be used as a sole basis for treatment. Nasal washings and aspirates are unacceptable for Xpert Xpress SARS-CoV-2/FLU/RSV testing.  Fact Sheet for Patients: EntrepreneurPulse.com.au  Fact Sheet for Healthcare Providers: IncredibleEmployment.be  This test is not yet approved or cleared by the [place] FDA and has been authorized for detection and/or diagnosis of SARS-CoV-2 by FDA under  an Emergency Use Authorization (EUA). This EUA will remain in effect (meaning this test can be used) for the duration of the COVID-19 declaration under Section 564(b)(1) of the Act, 21 U.S.C. section 360bbb-3(b)(1), unless the authorization is terminated or revoked.  Performed at [facility], [address] [place], [place] [address]   MRSA Next Gen by PCR, Nasal     Status: None   Collection Time: 03/18/21  6:07 PM   Specimen: Nasal Mucosa; Nasal Swab  Result Value Ref Range Status   MRSA by  PCR Next Gen NOT DETECTED NOT DETECTED Final    Comment: (NOTE) The GeneXpert MRSA Assay (FDA approved for NASAL specimens only), is one component of a comprehensive MRSA colonization surveillance program. It is not intended to diagnose MRSA infection nor to guide or monitor treatment for MRSA infections. Test performance is not FDA approved in patients less than 47 years old. Performed at [facility], [address] [place], [place] [address]   CULTURE, BLOOD (ROUTINE X 2) w Reflex to ID Panel     Status: None (Preliminary result)   Collection Time: 03/18/21  8:20 PM   Specimen: BLOOD  Result Value Ref Range Status   Specimen Description BLOOD BLOOD RIGHT HAND  Final   Special Requests   Final    BOTTLES DRAWN AEROBIC AND ANAEROBIC Blood Culture adequate volume   Culture   Final    NO GROWTH < 12 HOURS Performed at [facility], [address], [place], [place] [address]    Report Status PENDING  Incomplete  CULTURE, BLOOD (ROUTINE X 2) w Reflex to ID Panel     Status: None (Preliminary result)   Collection Time: 03/18/21  8:20 PM   Specimen: BLOOD  Result Value Ref Range Status   Specimen Description BLOOD BLOOD LEFT HAND  Final   Special Requests   Final    BOTTLES DRAWN AEROBIC AND ANAEROBIC Blood Culture adequate volume   Culture   Final    NO GROWTH < 12 HOURS Performed at [facility], [address] [place],  [address]     Report Status PENDING  Incomplete         Radiology Studies: DG Chest 2 View  Result Date: 03/18/2021 CLINICAL DATA:  60 year old male with chest pain EXAM: CHEST - 2 VIEW COMPARISON:  01/19/2021 FINDINGS: Cardiomediastinal silhouette unchanged with cardiomegaly. Calcifications of the aortic arch. Increased peribronchial thickening perceived at the bilateral hilar region. Similar appearance of coarsened interstitial markings. No interlobular septal thickening. No new confluent airspace disease. No pneumothorax. No large pleural effusion. No displaced fracture.  Degenerative changes of the spine IMPRESSION: Similar chronic changes and bronchial wall thickening, without evidence of acute cardiopulmonary disease Electronically Signed   By: Corrie Mckusick D.O.   On: 03/18/2021 11:50   DG Tibia/Fibula Left  Result Date: 03/18/2021 CLINICAL DATA:  Wound infection of the left calf 6 days. EXAM: LEFT TIBIA AND FIBULA - 2 VIEW COMPARISON:  None. FINDINGS: There is no evidence of fracture or dislocation. There is no osteopenia or bony destruction identified to suggest osteomyelitis. Soft tissues are unremarkable. IMPRESSION: No acute abnormality. Electronically Signed   By: Abelardo Diesel M.D.   On: 03/18/2021 12:31        Scheduled Meds:  apixaban  5 mg Oral BID   atorvastatin  80 mg Oral Daily   Chlorhexidine Gluconate Cloth  6 each Topical Q0600   clopidogrel  75 mg Oral Daily   digoxin  0.125 mg Oral Daily   DULoxetine  60 mg Oral Daily   ezetimibe  10 mg Oral Daily   fluticasone furoate-vilanterol  1 puff Inhalation Daily   And   umeclidinium bromide  1 puff Inhalation Daily   gabapentin  800 mg Oral QID   insulin aspart  0-9 Units Subcutaneous TID AC & HS   [START ON 03/20/2021] methylPREDNISolone (SOLU-MEDROL) injection  40 mg Intravenous Q24H   multivitamin with minerals  1 tablet Oral Daily   nicotine  21 mg Transdermal Daily  pantoprazole  40 mg Oral Daily   torsemide  40 mg Oral  Daily   traZODone  150 mg Oral QHS   Continuous Infusions:  cefTRIAXone (ROCEPHIN)  IV Stopped (03/18/21 1656)   norepinephrine (LEVOPHED) Adult infusion Stopped (03/19/21 0914)   vancomycin       LOS: 1 day    Time spent: 32 minutes    Sharen Hones, MD [facility]   To contact the attending provider between 7A-7P or the covering provider during after hours 7P-7A, please log into the web site [email or web address] and access using universal [place] password for that web site. If you do not have the password, please call the hospital operator.  03/19/2021, 10:16 AM

## 2021-03-19 NOTE — Progress Notes (Signed)
Report called to Mendel Ryder, RN on 1A.

## 2021-03-19 NOTE — Plan of Care (Signed)
  Problem: Education: Goal: Knowledge of General Education information will improve Description: Including pain rating scale, medication(s)/side effects and non-pharmacologic comfort measures Outcome: Progressing   Problem: Clinical Measurements: Goal: Ability to maintain clinical measurements within normal limits will improve Outcome: Progressing Goal: Cardiovascular complication will be avoided Outcome: Progressing   Problem: Coping: Goal: Level of anxiety will decrease Outcome: Progressing   Problem: Pain Managment: Goal: General experience of comfort will improve Outcome: Progressing   Problem: Safety: Goal: Ability to remain free from injury will improve Outcome: Progressing   Problem: Skin Integrity: Goal: Risk for impaired skin integrity will decrease Outcome: Progressing

## 2021-03-20 LAB — CBC WITH DIFFERENTIAL/PLATELET
Abs Immature Granulocytes: 0.04 10*3/uL (ref 0.00–0.07)
Basophils Absolute: 0 10*3/uL (ref 0.0–0.1)
Basophils Relative: 0 %
Eosinophils Absolute: 0 10*3/uL (ref 0.0–0.5)
Eosinophils Relative: 0 %
HCT: 33.1 % — ABNORMAL LOW (ref 39.0–52.0)
Hemoglobin: 11.4 g/dL — ABNORMAL LOW (ref 13.0–17.0)
Immature Granulocytes: 1 %
Lymphocytes Relative: 8 %
Lymphs Abs: 0.5 10*3/uL — ABNORMAL LOW (ref 0.7–4.0)
MCH: 27.1 pg (ref 26.0–34.0)
MCHC: 34.4 g/dL (ref 30.0–36.0)
MCV: 78.8 fL — ABNORMAL LOW (ref 80.0–100.0)
Monocytes Absolute: 0.4 10*3/uL (ref 0.1–1.0)
Monocytes Relative: 6 %
Neutro Abs: 5.4 10*3/uL (ref 1.7–7.7)
Neutrophils Relative %: 85 %
Platelets: 134 10*3/uL — ABNORMAL LOW (ref 150–400)
RBC: 4.2 MIL/uL — ABNORMAL LOW (ref 4.22–5.81)
RDW: 21.3 % — ABNORMAL HIGH (ref 11.5–15.5)
Smear Review: NORMAL
WBC: 6.4 10*3/uL (ref 4.0–10.5)
nRBC: 0 % (ref 0.0–0.2)

## 2021-03-20 LAB — BASIC METABOLIC PANEL
Anion gap: 10 (ref 5–15)
BUN: 30 mg/dL — ABNORMAL HIGH (ref 6–20)
CO2: 30 mmol/L (ref 22–32)
Calcium: 9 mg/dL (ref 8.9–10.3)
Chloride: 84 mmol/L — ABNORMAL LOW (ref 98–111)
Creatinine, Ser: 1.49 mg/dL — ABNORMAL HIGH (ref 0.61–1.24)
GFR, Estimated: 54 mL/min — ABNORMAL LOW (ref >60)
Glucose, Bld: 141 mg/dL — ABNORMAL HIGH (ref 70–99)
Potassium: 4.7 mmol/L (ref 3.5–5.1)
Sodium: 124 mmol/L — ABNORMAL LOW (ref 135–145)

## 2021-03-20 LAB — GLUCOSE, CAPILLARY
Glucose-Capillary: 172 mg/dL — ABNORMAL HIGH (ref 70–99)
Glucose-Capillary: 224 mg/dL — ABNORMAL HIGH (ref 70–99)
Glucose-Capillary: 280 mg/dL — ABNORMAL HIGH (ref 70–99)
Glucose-Capillary: 192 mg/dL — ABNORMAL HIGH (ref 70–99)

## 2021-03-20 LAB — LEGIONELLA PNEUMOPHILA TOTAL AB: Legionella Pneumo Total Ab: <0.91 OD ratio (ref 0.00–0.90)

## 2021-03-20 LAB — MAGNESIUM: Magnesium: 2.2 mg/dL (ref 1.7–2.4)

## 2021-03-20 MED ORDER — AMOXICILLIN-POT CLAVULANATE 875-125 MG PO TABS
1.0000 | ORAL_TABLET | Freq: Two times a day (BID) | ORAL | Status: DC
  Administered 2021-03-20 – 2021-03-21 (×3): 1 via ORAL
  Filled 2021-03-20 (×3): qty 1

## 2021-03-20 MED ORDER — SODIUM CHLORIDE 1 G PO TABS
1.0000 g | ORAL_TABLET | Freq: Every day | ORAL | Status: DC
  Administered 2021-03-20 – 2021-03-21 (×2): 1 g via ORAL
  Filled 2021-03-20 (×2): qty 1

## 2021-03-20 NOTE — Progress Notes (Signed)
PROGRESS NOTE    [name] [name]  [id number] DOB: [birth year] DOA: 03/18/2021 PCP: [name], MD   Chief complaint.  Leg pain. Brief Narrative:  [name] [name] is a 60 y.o. male with medical history significant of hypertension, hyperlipidemia, diabetes mellitus, COPD, asthma, GERD, depression, varicose vein, tobacco abuse, syncope, right subclavian artery stenosis, OSA, PAF on Eliquis, PVD, sCHF with EF <20%, CAD, BPH, carotid artery stenosis, peripheral neuropathy, RLS, CKD stage IIIa, s/p of right BKA, who presents with leg pain. Patient had a significant leg redness, was diagnosed with acute cellulitis.  Was placed on vancomycin and Rocephin. Patient has significant hypotension, does not meet sepsis criteria, was placed on Levophed.  Blood pressure much better today, able to wean off 10/10.   Assessment & Plan:   Principal Problem:   Cellulitis of left lower extremity Active Problems:   HLD (hyperlipidemia)   Hypertension   Tobacco use   Chest pain   Chronic systolic CHF (congestive heart failure) (HCC)   COPD (chronic obstructive pulmonary disease) (HCC)   CAD (coronary artery disease)   COPD with acute exacerbation (HCC)   PVD (peripheral vascular disease) (HCC)   Chronic a-fib (HCC)   BPH (benign prostatic hyperplasia)   Depression   CKD (chronic kidney disease), stage IIIa   Elevated troponin   Diarrhea   Chronic ulcer of left leg (HCC)   Hypotension  Left lower extremity cellulitis Hypotension. Condition much improved, blood pressure has been stable since off pressor.  Leg much improved, I will change antibiotic to Augmentin.   Chronic systolic congestive heart failure. Hyponatremia secondary to SIADH. Patient does not have any short of breath or hypoxemia.  Restarted torsemide.  Patient developed worsening hyponatremia today with a sodium 124.  He was taking in excess amount of water, I will place fluid restriction.  Continue torsemide.  I also will give him  1 g of sodium chloride tablets daily.   Chest pain Mild elevation troponin secondary to chronic systolic congestive heart failure. Patient chest pain only last about a few seconds, noncardiac in nature. Patient can be followed with cardiology as outpatient.  COPD exacerbation Patient initially seen by pulmonology, diagnosed with COPD exacerbation based on increased mucus production.  I have reduced Solu-Medrol to 40 mg daily.  Plan to continue for 1 more day tomorrow and discontinue. He was requiring 2 L oxygen at time admission, probably from congestive heart failure and not taking home torsemide.  Off oxygen after restarted torsemide.   Chronic atrial fibrillation. Resume Eliquis.  Coreg on hold due to low blood pressure.  Type 2 diabetes with renal complication. Continue sliding scale insulin.  Glucose well controlled.  Chronic kidney disease stage IIIa. Mild hyperkalemia. Hypomagnesemia Potassium has normalized, renal function stable after starting torsemide.  Sodium level should improve by tomorrow, he may be discharged when sodium level is better.   DVT prophylaxis: Eliquis Code Status: full Family Communication:  Disposition Plan:      Status is: Inpatient   Remains inpatient appropriate because:Hemodynamically unstable and Inpatient level of care appropriate due to severity of illness   Dispo: The patient is from: Home              Anticipated d/c is to: Home              Patient currently is not medically stable to d/c.              Difficult to place patient No  I/O last 3 completed shifts: In: 1059.7 [P.O.:240; I.V.:269.7; IV Piggyback:550] Out: 2025 [Urine:2025] Total I/O In: 350 [P.O.:350] Out: 450 [Urine:450]    Consultants:  Pulm   Procedures: None   Antimicrobials: Augmentin   Subjective: Patient left leg pain much improved, no swelling. No fever or chills. Denies any short of breath.  He has a cough, nausea, nonproductive. No  abdominal pain or nausea vomiting. No dysuria hematuria.  Objective: Vitals:   03/19/21 2002 03/20/21 0358 03/20/21 0530 03/20/21 0745  BP: 106/62 111/74  117/85  Pulse: 62 71  71  Resp: '17 17  15  '$ Temp: 98.1 F (36.7 C) 97.6 F (36.4 C)  98.1 F (36.7 C)  TempSrc:  Oral    SpO2: 96% 95% 99% 96%  Weight:      Height:        Intake/Output Summary (Last 24 hours) at 03/20/2021 1031 Last data filed at 03/20/2021 0916 Gross per 24 hour  Intake 1086.8 ml  Output 1725 ml  Net -638.2 ml   Filed Weights   03/18/21 1101 03/18/21 1639 03/19/21 0500  Weight: 78.9 kg 84.3 kg 87.3 kg    Examination:  General exam: Appears calm and comfortable  Respiratory system: Clear to auscultation. Respiratory effort normal. Cardiovascular system: S1 & S2 heard, RRR. No JVD, murmurs, rubs, gallops or clicks. No pedal edema. Gastrointestinal system: Abdomen is nondistended, soft and nontender. No organomegaly or masses felt. Normal bowel sounds heard. Central nervous system: Alert and oriented. No focal neurological deficits. Extremities: Left leg redness much improved.  No swelling.  No tenderness. Skin: No rashes, lesions or ulcers Psychiatry: Mood & affect appropriate.     Data Reviewed: I have personally reviewed following labs and imaging studies  CBC: Recent Labs  Lab 03/18/21 1110 03/19/21 0505 03/20/21 0642  WBC 5.4 5.5 6.4  NEUTROABS  --   --  5.4  HGB 11.2* 12.7* 11.4*  HCT 32.6* 36.2* 33.1*  MCV 81.5 79.6* 78.8*  PLT 164 184 [id number]*   Basic Metabolic Panel: Recent Labs  Lab 03/18/21 1110 03/18/21 2020 03/19/21 0505 03/20/21 0642  NA 130*  --  127* 124*  K 4.2  --  5.2* 4.7  CL 89*  --  88* 84*  CO2 31  --  29 30  GLUCOSE 90  --  178* 141*  BUN 23*  --  24* 30*  CREATININE 1.47*  --  1.44* 1.49*  CALCIUM 8.5*  --  8.9 9.0  MG  --  1.3* 2.4 2.2  PHOS  --  4.3 4.2  --    GFR: Estimated Creatinine Clearance: 57 mL/min (A) (by C-G formula based on SCr of 1.49  mg/dL (H)). Liver Function Tests: No results for input(s): AST, ALT, ALKPHOS, BILITOT, PROT, ALBUMIN in the last 168 hours. No results for input(s): LIPASE, AMYLASE in the last 168 hours. No results for input(s): AMMONIA in the last 168 hours. Coagulation Profile: Recent Labs  Lab 03/18/21 1201  INR 1.2   Cardiac Enzymes: No results for input(s): CKTOTAL, CKMB, CKMBINDEX, TROPONINI in the last 168 hours. BNP (last 3 results) No results for input(s): PROBNP in the last 8760 hours. HbA1C: Recent Labs    03/18/21 1110  HGBA1C 6.2*   CBG: Recent Labs  Lab 03/19/21 0746 03/19/21 1107 03/19/21 1614 03/19/21 2029 03/20/21 0746  GLUCAP 193* 287* 199* 240* 172*   Lipid Profile: Recent Labs    03/19/21 0505  CHOL 68  HDL 34*  LDLCALC 27  TRIG 36  CHOLHDL 2.0   Thyroid Function Tests: No results for input(s): TSH, T4TOTAL, FREET4, T3FREE, THYROIDAB in the last 72 hours. Anemia Panel: No results for input(s): VITAMINB12, FOLATE, FERRITIN, TIBC, IRON, RETICCTPCT in the last 72 hours. Sepsis Labs: Recent Labs  Lab 03/18/21 1201  LATICACIDVEN 1.8    Recent Results (from the past 240 hour(s))  Culture, blood (routine x 2)     Status: None (Preliminary result)   Collection Time: 03/18/21 12:01 PM   Specimen: BLOOD  Result Value Ref Range Status   Specimen Description BLOOD LEFT FA  Final   Special Requests   Final    BOTTLES DRAWN AEROBIC AND ANAEROBIC Blood Culture adequate volume   Culture   Final    NO GROWTH 2 DAYS Performed at [facility], [address], [place], [place] [address]    Report Status PENDING  Incomplete  Culture, blood (routine x 2)     Status: None (Preliminary result)   Collection Time: 03/18/21 12:02 PM   Specimen: BLOOD  Result Value Ref Range Status   Specimen Description BLOOD RIGHT FA  Final   Special Requests   Final    BOTTLES DRAWN AEROBIC AND ANAEROBIC Blood Culture adequate volume   Culture   Final    NO GROWTH 2  DAYS Performed at [facility], [address], [place], [place] [address]    Report Status PENDING  Incomplete  Resp Panel by RT-PCR (Flu A&B, Covid) Nasopharyngeal Swab     Status: None   Collection Time: 03/18/21  2:43 PM   Specimen: Nasopharyngeal Swab; Nasopharyngeal(NP) swabs in vial transport medium  Result Value Ref Range Status   SARS Coronavirus 2 by RT PCR NEGATIVE NEGATIVE Final    Comment: (NOTE) SARS-CoV-2 target nucleic acids are NOT DETECTED.  The SARS-CoV-2 RNA is generally detectable in upper respiratory specimens during the acute phase of infection. The lowest concentration of SARS-CoV-2 viral copies this assay can detect is 138 copies/mL. A negative result does not preclude SARS-Cov-2 infection and should not be used as the sole basis for treatment or other patient management decisions. A negative result may occur with  improper specimen collection/handling, submission of specimen other than nasopharyngeal swab, presence of viral mutation(s) within the areas targeted by this assay, and inadequate number of viral copies(<138 copies/mL). A negative result must be combined with clinical observations, patient history, and epidemiological information. The expected result is Negative.  Fact Sheet for Patients:  EntrepreneurPulse.com.au  Fact Sheet for Healthcare Providers:  IncredibleEmployment.be  This test is no t yet approved or cleared by the [place] FDA and  has been authorized for detection and/or diagnosis of SARS-CoV-2 by FDA under an Emergency Use Authorization (EUA). This EUA will remain  in effect (meaning this test can be used) for the duration of the COVID-19 declaration under Section 564(b)(1) of the Act, 21 U.S.C.section 360bbb-3(b)(1), unless the authorization is terminated  or revoked sooner.       Influenza A by PCR NEGATIVE NEGATIVE Final   Influenza B by PCR NEGATIVE NEGATIVE Final     Comment: (NOTE) The Xpert Xpress SARS-CoV-2/FLU/RSV plus assay is intended as an aid in the diagnosis of influenza from Nasopharyngeal swab specimens and should not be used as a sole basis for treatment. Nasal washings and aspirates are unacceptable for Xpert Xpress SARS-CoV-2/FLU/RSV testing.  Fact Sheet for Patients: EntrepreneurPulse.com.au  Fact Sheet for Healthcare Providers: IncredibleEmployment.be  This test is not yet approved or cleared by  the [employer] and has been authorized for detection and/or diagnosis of SARS-CoV-2 by FDA under an Emergency Use Authorization (EUA). This EUA will remain in effect (meaning this test can be used) for the duration of the COVID-19 declaration under Section 564(b)(1) of the Act, 21 U.S.C. section 360bbb-3(b)(1), unless the authorization is terminated or revoked.  Performed at [facility], [address] [place], [place] [address]   MRSA Next Gen by PCR, Nasal     Status: None   Collection Time: 03/18/21  6:07 PM   Specimen: Nasal Mucosa; Nasal Swab  Result Value Ref Range Status   MRSA by PCR Next Gen NOT DETECTED NOT DETECTED Final    Comment: (NOTE) The GeneXpert MRSA Assay (FDA approved for NASAL specimens only), is one component of a comprehensive MRSA colonization surveillance program. It is not intended to diagnose MRSA infection nor to guide or monitor treatment for MRSA infections. Test performance is not FDA approved in patients less than 50 years old. Performed at [facility], [address] [place], [place] [address]   CULTURE, BLOOD (ROUTINE X 2) w Reflex to ID Panel     Status: None (Preliminary result)   Collection Time: 03/18/21  8:20 PM   Specimen: BLOOD  Result Value Ref Range Status   Specimen Description BLOOD BLOOD RIGHT HAND  Final   Special Requests   Final    BOTTLES DRAWN AEROBIC AND ANAEROBIC Blood Culture adequate volume   Culture   Final     NO GROWTH 2 DAYS Performed at [facility], [address], [place], [place] [address]    Report Status PENDING  Incomplete  CULTURE, BLOOD (ROUTINE X 2) w Reflex to ID Panel     Status: None (Preliminary result)   Collection Time: 03/18/21  8:20 PM   Specimen: BLOOD  Result Value Ref Range Status   Specimen Description BLOOD BLOOD LEFT HAND  Final   Special Requests   Final    BOTTLES DRAWN AEROBIC AND ANAEROBIC Blood Culture adequate volume   Culture   Final    NO GROWTH 2 DAYS Performed at [facility], [address], [place], [place] [address]    Report Status PENDING  Incomplete         Radiology Studies: DG Chest 2 View  Result Date: 03/18/2021 CLINICAL DATA:  60 year old male with chest pain EXAM: CHEST - 2 VIEW COMPARISON:  01/19/2021 FINDINGS: Cardiomediastinal silhouette unchanged with cardiomegaly. Calcifications of the aortic arch. Increased peribronchial thickening perceived at the bilateral hilar region. Similar appearance of coarsened interstitial markings. No interlobular septal thickening. No new confluent airspace disease. No pneumothorax. No large pleural effusion. No displaced fracture.  Degenerative changes of the spine IMPRESSION: Similar chronic changes and bronchial wall thickening, without evidence of acute cardiopulmonary disease Electronically Signed   By: Corrie Mckusick D.O.   On: 03/18/2021 11:50   DG Tibia/Fibula Left  Result Date: 03/18/2021 CLINICAL DATA:  Wound infection of the left calf 6 days. EXAM: LEFT TIBIA AND FIBULA - 2 VIEW COMPARISON:  None. FINDINGS: There is no evidence of fracture or dislocation. There is no osteopenia or bony destruction identified to suggest osteomyelitis. Soft tissues are unremarkable. IMPRESSION: No acute abnormality. Electronically Signed   By: Abelardo Diesel M.D.   On: 03/18/2021 12:31        Scheduled Meds:  amoxicillin-clavulanate  1 tablet Oral Q12H   apixaban  5 mg Oral BID    Chlorhexidine Gluconate Cloth  6 each Topical Q0600   clopidogrel  75 mg Oral Daily   digoxin  0.125 mg Oral Daily   DULoxetine  60 mg Oral Daily   ezetimibe  10 mg Oral Daily   fluticasone furoate-vilanterol  1 puff Inhalation Daily   And   umeclidinium bromide  1 puff Inhalation Daily   gabapentin  800 mg Oral QID   insulin aspart  0-9 Units Subcutaneous TID AC & HS   methylPREDNISolone (SOLU-MEDROL) injection  40 mg Intravenous Q24H   multivitamin with minerals  1 tablet Oral Daily   nicotine  21 mg Transdermal Daily   pantoprazole  40 mg Oral Daily   sodium chloride  1 g Oral Daily   torsemide  40 mg Oral Daily   traZODone  150 mg Oral QHS   Continuous Infusions:   LOS: 2 days    Time spent: 27 minutes    Sharen Hones, MD [facility]   To contact the attending provider between 7A-7P or the covering provider during after hours 7P-7A, please log into the web site [email or web address] and access using universal [place] password for that web site. If you do not have the password, please call the hospital operator.  03/20/2021, 10:31 AM

## 2021-03-20 NOTE — Progress Notes (Signed)
CRITICAL CARE CONSULT     Name: [name] [name] MRN: [id number] DOB: [birth year]     LOS: 2   SUBJECTIVE FINDINGS & SIGNIFICANT EVENTS    Patient description:  This is a 60 year old male with a history significant for COPD lifelong smoking, asthma overlap with OSA.  GERD major depression, varicose veins recurrent syncope and right subclavian artery stenosis, also PAF on Eliquis, PVD CHF which is systolic with an EF of less than 20%, BPH and carotid artery stenosis.  Also has CKD stage IIIa and right BKA came in with leg pain and cellulitis.  States he was at home and was feeling unwell with undue fatigue and came in with worsening generalized pain.  Further questioning reveals that left lower extremity has been cellulitic for quite some time at least 3 months.  During my evaluation patient is in septic shock with a map of 73 on Levophed support with rhonchorous labored breathing.  PCCM consultation called for further evaluation management of what appears to be septic shock secondary to left leg cellulitis.  Chest x-ray was done in the ED without consolidated infiltrate or pleural effusions however bronchitic changes are noted throughout bilaterally.  03/20/21- patient is improved on room air now. He had no overnight events.  Phlegm production is easily managed with expectoration. PCCM will sign off at this time.  Ive provided patient with [facility] pulmonology contact information.   Lines/tubes :   Microbiology/Sepsis markers: Results for orders placed or performed during the hospital encounter of 03/18/21  Culture, blood (routine x 2)     Status: None (Preliminary result)   Collection Time: 03/18/21 12:01 PM   Specimen: BLOOD  Result Value Ref Range Status   Specimen Description BLOOD LEFT FA  Final   Special Requests   Final     BOTTLES DRAWN AEROBIC AND ANAEROBIC Blood Culture adequate volume   Culture   Final    NO GROWTH 2 DAYS Performed at [facility], [address], [place], [place] [address]    Report Status PENDING  Incomplete  Culture, blood (routine x 2)     Status: None (Preliminary result)   Collection Time: 03/18/21 12:02 PM   Specimen: BLOOD  Result Value Ref Range Status   Specimen Description BLOOD RIGHT FA  Final   Special Requests   Final    BOTTLES DRAWN AEROBIC AND ANAEROBIC Blood Culture adequate volume   Culture   Final    NO GROWTH 2 DAYS Performed at [facility], [address] [place], [place] [address]    Report Status PENDING  Incomplete  Resp Panel by RT-PCR (Flu A&B, Covid) Nasopharyngeal Swab     Status: None   Collection Time: 03/18/21  2:43 PM   Specimen: Nasopharyngeal Swab; Nasopharyngeal(NP) swabs in vial transport medium  Result Value Ref Range Status   SARS Coronavirus 2 by RT PCR NEGATIVE NEGATIVE Final    Comment: (NOTE) SARS-CoV-2 target nucleic acids are NOT DETECTED.  The SARS-CoV-2 RNA is generally detectable in upper respiratory specimens during the acute phase of infection. The lowest concentration of SARS-CoV-2 viral copies this assay can detect is 138 copies/mL. A negative result does not preclude SARS-Cov-2 infection and should not be used as the sole basis for treatment or other patient management decisions. A negative result may occur with  improper specimen collection/handling, submission of specimen other than nasopharyngeal swab, presence of viral mutation(s) within the areas targeted by this assay, and inadequate number of viral copies(<138 copies/mL).  A negative result must be combined with clinical observations, patient history, and epidemiological information. The expected result is Negative.  Fact Sheet for Patients:  EntrepreneurPulse.com.au  Fact Sheet for Healthcare Providers:   IncredibleEmployment.be  This test is no t yet approved or cleared by the [place] FDA and  has been authorized for detection and/or diagnosis of SARS-CoV-2 by FDA under an Emergency Use Authorization (EUA). This EUA will remain  in effect (meaning this test can be used) for the duration of the COVID-19 declaration under Section 564(b)(1) of the Act, 21 U.S.C.section 360bbb-3(b)(1), unless the authorization is terminated  or revoked sooner.       Influenza A by PCR NEGATIVE NEGATIVE Final   Influenza B by PCR NEGATIVE NEGATIVE Final    Comment: (NOTE) The Xpert Xpress SARS-CoV-2/FLU/RSV plus assay is intended as an aid in the diagnosis of influenza from Nasopharyngeal swab specimens and should not be used as a sole basis for treatment. Nasal washings and aspirates are unacceptable for Xpert Xpress SARS-CoV-2/FLU/RSV testing.  Fact Sheet for Patients: EntrepreneurPulse.com.au  Fact Sheet for Healthcare Providers: IncredibleEmployment.be  This test is not yet approved or cleared by the [place] FDA and has been authorized for detection and/or diagnosis of SARS-CoV-2 by FDA under an Emergency Use Authorization (EUA). This EUA will remain in effect (meaning this test can be used) for the duration of the COVID-19 declaration under Section 564(b)(1) of the Act, 21 U.S.C. section 360bbb-3(b)(1), unless the authorization is terminated or revoked.  Performed at [facility], [address] [place], [place] [address]   MRSA Next Gen by PCR, Nasal     Status: None   Collection Time: 03/18/21  6:07 PM   Specimen: Nasal Mucosa; Nasal Swab  Result Value Ref Range Status   MRSA by PCR Next Gen NOT DETECTED NOT DETECTED Final    Comment: (NOTE) The GeneXpert MRSA Assay (FDA approved for NASAL specimens only), is one component of a comprehensive MRSA colonization surveillance program. It is not intended to  diagnose MRSA infection nor to guide or monitor treatment for MRSA infections. Test performance is not FDA approved in patients less than 4 years old. Performed at [facility], [address] [place], [place] [address]   CULTURE, BLOOD (ROUTINE X 2) w Reflex to ID Panel     Status: None (Preliminary result)   Collection Time: 03/18/21  8:20 PM   Specimen: BLOOD  Result Value Ref Range Status   Specimen Description BLOOD BLOOD RIGHT HAND  Final   Special Requests   Final    BOTTLES DRAWN AEROBIC AND ANAEROBIC Blood Culture adequate volume   Culture   Final    NO GROWTH 2 DAYS Performed at [facility], [address] [place], [place] [address]    Report Status PENDING  Incomplete  CULTURE, BLOOD (ROUTINE X 2) w Reflex to ID Panel     Status: None (Preliminary result)   Collection Time: 03/18/21  8:20 PM   Specimen: BLOOD  Result Value Ref Range Status   Specimen Description BLOOD BLOOD LEFT HAND  Final   Special Requests   Final    BOTTLES DRAWN AEROBIC AND ANAEROBIC Blood Culture adequate volume   Culture   Final    NO GROWTH 2 DAYS Performed at [facility], [address], [place], [place] [address]    Report Status PENDING  Incomplete    Anti-infectives:  Anti-infectives (From admission, onward)    Start  Dose/Rate Route Frequency Ordered Stop   03/20/21 1130  amoxicillin-clavulanate (AUGMENTIN) 875-125 MG per tablet 1 tablet        1 tablet Oral Every 12 hours 03/20/21 1031     03/19/21 1600  vancomycin (VANCOREADY) IVPB 1250 mg/250 mL  Status:  Discontinued        1,250 mg 166.7 mL/hr over 90 Minutes Intravenous Every 24 hours 03/18/21 1527 03/20/21 1031   03/18/21 1500  cefTRIAXone (ROCEPHIN) 1 g in sodium chloride 0.9 % 100 mL IVPB  Status:  Discontinued        1 g 200 mL/hr over 30 Minutes Intravenous Every 24 hours 03/18/21 1435 03/20/21 1031   03/18/21 1500  vancomycin (VANCOCIN) IVPB 1000 mg/200 mL premix        1,000  mg 200 mL/hr over 60 Minutes Intravenous  Once 03/18/21 1456 03/18/21 1625   03/18/21 1345  vancomycin (VANCOCIN) IVPB 1000 mg/200 mL premix        1,000 mg 200 mL/hr over 60 Minutes Intravenous  Once 03/18/21 1340 03/18/21 1509   03/18/21 1345  piperacillin-tazobactam (ZOSYN) IVPB 3.375 g        3.375 g 100 mL/hr over 30 Minutes Intravenous  Once 03/18/21 1340 03/18/21 1414        Consults: Treatment Team:  Ottie Glazier, MD       PAST MEDICAL HISTORY   Past Medical History:  Diagnosis Date   Acute on chronic heart failure ([place])    AKI (acute kidney injury) ([place]) 01/03/2018   Angina at rest [facility]) 08/13/2017   Arthritis    Asthma    Atherosclerosis    BPH (benign prostatic hyperplasia)    Carotid arterial disease ([place])    Cellulitis of foot, left 09/27/2019   Charcot's joint of foot, right    Chronic combined systolic (congestive) and diastolic (congestive) heart failure ([place])    a. 02/2013 EF 50% by LV gram; b. 04/2017 Echo: EF 25-30%. diff HK. Gr2 DD; c. 06/2020 Echo: EF <20%, glob HK. RVS 53.34mHg. Sev BAE. Mild to mod TR.   Chronic foot pain (Secondary Area of Pain) (Right) 01/29/2018   COPD (chronic obstructive pulmonary disease) (HCC)    COPD with acute exacerbation ([place] 01/30/2020   Coronary artery disease    a. 2013 S/P PCI of LAD ([facility];  b. 03/2013 PCI: RCA 90p ( 2.5 x 23 mm DES); c. 02/2014 Cath: patent RCA stent->Med Rx; d. 04/2017 Cath: LM nl, LAd 30ost/p, 619m, D1 80ost, LCX 90p/m, OM1 85, RCA 70/20p, 8060m0d->Referred for CT Surg-felt to be poor candidate; d. 01/2020 Cath: LM nl, LAD 20p/m, 60d. D1 80. LCX 90p/m. LPAV 95, RCA 80/20p, 80p/m.   Diabetic neuropathy (HCC[place]  Diabetic ulcer of right foot ([place]CC[place]  [place]    HFrEF (heart failure with reduced ejection fraction) (HCCEagle8/22/2021   Hyperlipidemia    Hypertension    Ischemic cardiomyopathy    a. 02/2013 EF 50% by LV gram; b. 04/2017 Echo: EF 25-30%. diff HK. Gr2 DD; c. 06/2020 Echo: EF <20%, glob  HK. RVS 53.2mm33m Sev BAE. Mild to mod TR.   Morbid obesity (HCC)[place] Neuropathy    PAD (peripheral artery disease) (HCC)[place] a. Followed by Dr. Dew;[place] 08/2010 Periph Angio: RSFA 70-80p (6X60 self-expanding stent); c. 09/2010 Periph Angio: L SFA 100p (7x unknown length self-expanding stent); d. 06/2015 Periph Angio: R SFA short segment occlusion (6x12 self-expanding stent).   Persistent atrial fibrillation (HCC)[place]  Restless leg syndrome    Sepsis ([place]) 03/30/2018   Sleep apnea    Subclavian artery stenosis, right (HCC)    Syncope and collapse    Tobacco use    a. 75+ yr hx - still smoking 1ppd, down from 2 ppd.   Type II diabetes mellitus (HCC)    Unstable angina ([place]) 07/11/2016   Varicose vein      SURGICAL HISTORY   Past Surgical History:  Procedure Laterality Date   ABDOMINAL AORTAGRAM N/A 06/23/2013   Procedure: ABDOMINAL Maxcine Ham;  Surgeon: Wellington Hampshire, MD;  Location: [place] CATH LAB;  Service: Cardiovascular;  Laterality: N/A;   AMPUTATION Right 04/01/2018   Procedure: AMPUTATION BELOW KNEE;  Surgeon: Algernon Huxley, MD;  Location: [facility] ORS;  Service: Vascular;  Laterality: Right;   AMPUTATION TOE Left 08/11/2020   Procedure: AMPUTATION TOE-2nd Toe;  Surgeon: Samara Deist, DPM;  Location: [facility] ORS;  Service: Podiatry;  Laterality: Left;   APPENDECTOMY     CARDIAC CATHETERIZATION  10/14   [facility]; X1 STENT PROXIMAL RCA   CARDIAC CATHETERIZATION  02/2010   [facility]   CARDIAC [facility]  [facility]/21/2014   [facility]   CLAVICLE SURGERY     HERNIA REPAIR     IRRIGATION AND DEBRIDEMENT FOOT Right 01/04/2018   Procedure: IRRIGATION AND DEBRIDEMENT FOOT;  Surgeon: Samara Deist, DPM;  Location: [facility] ORS;  Service: Podiatry;  Laterality: Right;   LEFT HEART CATH AND CORONARY ANGIOGRAPHY N/A 01/20/2020   Procedure: LEFT HEART CATH AND CORONARY ANGIOGRAPHY;  Surgeon: Wellington Hampshire, MD;  Location: [place] CV LAB;  Service: Cardiovascular;  Laterality: N/A;   LEFT HEART CATH AND CORS/GRAFTS  ANGIOGRAPHY N/A 04/28/2017   Procedure: LEFT HEART CATH AND CORONARY ANGIOGRAPHY;  Surgeon: Wellington Hampshire, MD;  Location: [place] CV LAB;  Service: Cardiovascular;  Laterality: N/A;   LOWER EXTREMITY ANGIOGRAPHY Right 03/03/2018   Procedure: LOWER EXTREMITY ANGIOGRAPHY;  Surgeon: Katha Cabal, MD;  Location: [place] CV LAB;  Service: Cardiovascular;  Laterality: Right;   LOWER EXTREMITY ANGIOGRAPHY Left 08/24/2018   Procedure: LOWER EXTREMITY ANGIOGRAPHY;  Surgeon: Algernon Huxley, MD;  Location: [place] CV LAB;  Service: Cardiovascular;  Laterality: Left;   LOWER EXTREMITY ANGIOGRAPHY Left 06/14/2019   Procedure: LOWER EXTREMITY ANGIOGRAPHY;  Surgeon: Algernon Huxley, MD;  Location: [place] CV LAB;  Service: Cardiovascular;  Laterality: Left;   LOWER EXTREMITY ANGIOGRAPHY Left 08/02/2019   Procedure: LOWER EXTREMITY ANGIOGRAPHY;  Surgeon: Algernon Huxley, MD;  Location: [place] CV LAB;  Service: Cardiovascular;  Laterality: Left;   LOWER EXTREMITY ANGIOGRAPHY Left 07/17/2020   Procedure: LOWER EXTREMITY ANGIOGRAPHY;  Surgeon: Algernon Huxley, MD;  Location: [place] CV LAB;  Service: Cardiovascular;  Laterality: Left;   PERIPHERAL ARTERIAL STENT GRAFT     x2 left/right   PERIPHERAL VASCULAR BALLOON ANGIOPLASTY Right 01/06/2018   Procedure: PERIPHERAL VASCULAR BALLOON ANGIOPLASTY;  Surgeon: Katha Cabal, MD;  Location: [place] CV LAB;  Service: Cardiovascular;  Laterality: Right;   PERIPHERAL VASCULAR CATHETERIZATION N/A 07/06/2015   Procedure: Abdominal Aortogram w/Lower Extremity;  Surgeon: Algernon Huxley, MD;  Location: [place] CV LAB;  Service: Cardiovascular;  Laterality: N/A;   PERIPHERAL VASCULAR CATHETERIZATION  07/06/2015   Procedure: Lower Extremity Intervention;  Surgeon: Algernon Huxley, MD;  Location: [place] CV LAB;  Service: Cardiovascular;;     FAMILY HISTORY   Family History  Problem Relation Age of Onset   Heart attack Father  Heart disease Father    Hypertension Father    Hyperlipidemia Father    Hypertension Mother    Hyperlipidemia Mother      SOCIAL HISTORY   Social History   Tobacco Use   Smoking status: Every Day    Packs/day: 1.00    Years: 41.00    Pack years: 41.00    Types: Cigarettes   Smokeless tobacco: Never  Vaping Use   Vaping Use: Never used  Substance Use Topics   Alcohol use: No   Drug use: No     MEDICATIONS   Current Medication:  Current Facility-Administered Medications:    acetaminophen (TYLENOL) tablet 650 mg, 650 mg, Oral, Q6H PRN, Ivor Costa, MD   albuterol (PROVENTIL) (2.5 MG/3ML) 0.083% nebulizer solution 2.5 mg, 2.5 mg, Nebulization, Q4H PRN, Nazari, Walid A, RPH, 2.5 mg at 03/19/21 2031   amoxicillin-clavulanate (AUGMENTIN) 875-125 MG per tablet 1 tablet, 1 tablet, Oral, Q12H, Sharen Hones, MD, 1 tablet at 03/20/21 1209   apixaban (ELIQUIS) tablet 5 mg, 5 mg, Oral, BID, Ivor Costa, MD, 5 mg at 03/20/21 0830   Chlorhexidine Gluconate Cloth 2 % PADS 6 each, 6 each, Topical, Q0600, Ivor Costa, MD, 6 each at 03/20/21 0515   clopidogrel (PLAVIX) tablet 75 mg, 75 mg, Oral, Daily, Ivor Costa, MD, 75 mg at 03/20/21 0830   digoxin (LANOXIN) tablet 0.125 mg, 0.125 mg, Oral, Daily, Ivor Costa, MD, 0.125 mg at 03/20/21 0829   DULoxetine (CYMBALTA) DR capsule 60 mg, 60 mg, Oral, Daily, Ivor Costa, MD, 60 mg at 03/20/21 0830   ezetimibe (ZETIA) tablet 10 mg, 10 mg, Oral, Daily, Ivor Costa, MD, 10 mg at 03/20/21 0829   fluticasone furoate-vilanterol (BREO ELLIPTA) 100-25 MCG/INH 1 puff, 1 puff, Inhalation, Daily, 1 puff at 03/20/21 0840 **AND** umeclidinium bromide (INCRUSE ELLIPTA) 62.5 MCG/INH 1 puff, 1 puff, Inhalation, Daily, Ivor Costa, MD, 1 puff at 03/20/21 0840   gabapentin (NEURONTIN) capsule 800 mg, 800 mg, Oral, QID, Ivor Costa, MD, 800 mg at 03/20/21 1209   insulin aspart (novoLOG) injection 0-9 Units, 0-9 Units, Subcutaneous, TID AC & HS, [place], Britton L, NP, 3  Units at 03/20/21 1209   ipratropium-albuterol (DUONEB) 0.5-2.5 (3) MG/3ML nebulizer solution 3 mL, 3 mL, Inhalation, Q6H PRN, Ivor Costa, MD, 3 mL at 03/20/21 0529   methylPREDNISolone sodium succinate (SOLU-MEDROL) 40 mg/mL injection 40 mg, 40 mg, Intravenous, Q24H, Sharen Hones, MD, 40 mg at 03/20/21 0514   multivitamin with minerals tablet 1 tablet, 1 tablet, Oral, Daily, Ivor Costa, MD, 1 tablet at 03/20/21 [id number]   nicotine (NICODERM CQ - dosed in mg/24 hours) patch 21 mg, 21 mg, Transdermal, Daily, Ivor Costa, MD, 21 mg at 03/20/21 0831   ondansetron (ZOFRAN) injection 4 mg, 4 mg, Intravenous, Q8H PRN, Ivor Costa, MD   oxyCODONE-acetaminophen (PERCOCET/ROXICET) 5-325 MG per tablet 2 tablet, 2 tablet, Oral, Q4H PRN, Sharen Hones, MD, 2 tablet at 03/20/21 1209   pantoprazole (PROTONIX) EC tablet 40 mg, 40 mg, Oral, Daily, Ivor Costa, MD, 40 mg at 03/20/21 0830   sodium chloride tablet 1 g, 1 g, Oral, Daily, Sharen Hones, MD, 1 g at 03/20/21 1209   torsemide (DEMADEX) tablet 40 mg, 40 mg, Oral, Daily, Sharen Hones, MD, 40 mg at 03/20/21 0830   traZODone (DESYREL) tablet 150 mg, 150 mg, Oral, QHS, Ivor Costa, MD, 150 mg at 03/19/21 2032    ALLERGIES   Dulaglutide, Ace inhibitors, Other, and Prednisone    REVIEW OF SYSTEMS  10 point ROS conducted and is negative except for left lower extremity pain and undue fatigue  PHYSICAL EXAMINATION   Vital Signs: Temp:  [97.4 F (36.3 C)-98.1 F (36.7 C)] 97.4 F (36.3 C) (10/11 1127) Pulse Rate:  [62-121] 87 (10/11 1127) Resp:  [15-18] 15 (10/11 1127) BP: (99-124)/(56-86) 124/86 (10/11 1127) SpO2:  [90 %-99 %] 94 % (10/11 1127)  GENERAL: Age-appropriate mild distress oriented x3 HEAD: Normocephalic, atraumatic.  EYES: Pupils equal, round, reactive to light.  No scleral icterus.  MOUTH: Moist mucosal membrane. NECK: Supple. No thyromegaly. No nodules. No JVD.  PULMONARY: Rhonchorous breathing bilaterally CARDIOVASCULAR: S1 and S2.  Regular rate and rhythm. No murmurs, rubs, or gallops.  GASTROINTESTINAL: Soft, nontender, non-distended. No masses. Positive bowel sounds. No hepatosplenomegaly.  MUSCULOSKELETAL: No swelling, clubbing, or edema.  Right BKA positive NEUROLOGIC: Mild distress due to acute illness SKIN:intact,warm,dry   PERTINENT DATA     Infusions:   Scheduled Medications:  amoxicillin-clavulanate  1 tablet Oral Q12H   apixaban  5 mg Oral BID   Chlorhexidine Gluconate Cloth  6 each Topical Q0600   clopidogrel  75 mg Oral Daily   digoxin  0.125 mg Oral Daily   DULoxetine  60 mg Oral Daily   ezetimibe  10 mg Oral Daily   fluticasone furoate-vilanterol  1 puff Inhalation Daily   And   umeclidinium bromide  1 puff Inhalation Daily   gabapentin  800 mg Oral QID   insulin aspart  0-9 Units Subcutaneous TID AC & HS   methylPREDNISolone (SOLU-MEDROL) injection  40 mg Intravenous Q24H   multivitamin with minerals  1 tablet Oral Daily   nicotine  21 mg Transdermal Daily   pantoprazole  40 mg Oral Daily   sodium chloride  1 g Oral Daily   torsemide  40 mg Oral Daily   traZODone  150 mg Oral QHS   PRN Medications: acetaminophen, albuterol, ipratropium-albuterol, ondansetron (ZOFRAN) IV, oxyCODONE-acetaminophen Hemodynamic parameters:   Intake/Output: 10/10 0701 - 10/11 0700 In: 736.8 [P.O.:240; I.V.:46.8; IV Piggyback:450] Out: 1275 [id number]  Ventilator  Settings:     LAB RESULTS:  Basic Metabolic Panel: Recent Labs  Lab 03/18/21 1110 03/18/21 2020 03/19/21 0505 03/20/21 0642  NA 130*  --  127* 124*  K 4.2  --  5.2* 4.7  CL 89*  --  88* 84*  CO2 31  --  29 30  GLUCOSE 90  --  178* 141*  BUN 23*  --  24* 30*  CREATININE 1.47*  --  1.44* 1.49*  CALCIUM 8.5*  --  8.9 9.0  MG  --  1.3* 2.4 2.2  PHOS  --  4.3 4.2  --     Liver Function Tests: No results for input(s): AST, ALT, ALKPHOS, BILITOT, PROT, ALBUMIN in the last 168 hours. No results for input(s): LIPASE, AMYLASE in  the last 168 hours. No results for input(s): AMMONIA in the last 168 hours. CBC: Recent Labs  Lab 03/18/21 1110 03/19/21 0505 03/20/21 0642  WBC 5.4 5.5 6.4  NEUTROABS  --   --  5.4  HGB 11.2* 12.7* 11.4*  HCT 32.6* 36.2* 33.1*  MCV 81.5 79.6* 78.8*  PLT 164 184 134*    Cardiac Enzymes: No results for input(s): CKTOTAL, CKMB, CKMBINDEX, TROPONINI in the last 168 hours. BNP: Invalid input(s): POCBNP CBG: Recent Labs  Lab 03/19/21 1107 03/19/21 1614 03/19/21 2029 03/20/21 0746 03/20/21 1128  GLUCAP 287* 199* 240* 172* 224*       IMAGING RESULTS:  Imaging: No results found. '@PROBHOSP'$ @ No results found.      ASSESSMENT AND PLAN    Septic shock Present on admission due to left lower extremity cellulitis presumably -use vasopressors to keep MAP>65 -follow ABG and LA -follow up cultures -emperic ABX   Acute on chronic systolic CHF with EF less than 20% -oxygen as needed -Lasix as tolerated -follow up cardiac enzymes as indicated ICU monitoring  Renal Failure-CKD  -follow chem 7 -follow UO -DC nonessential nephrotoxins -continue Foley Catheter-assess need daily  Acute exacerbation of COPD-moderate -Patient with thickened inspissated phlegm production with increased volume and increased O2 requirement -Patient's saturation 84% initial arrival to ED -Solu-Medrol 40 twice daily with weaning/tapering protocol -Patient on vancomycin and Zosyn empirically for cellulitis as above -Bronchopulmonary hygiene with flutter valve and intensive spirometry   ID -continue IV abx as prescibed -follow up cultures  GI/Nutrition GI PROPHYLAXIS as indicated DIET-->TF's as tolerated Constipation protocol as indicated  ENDO - ICU hypoglycemic\Hyperglycemia protocol -check FSBS per protocol   ELECTROLYTES -follow labs as needed -replace as needed -pharmacy consultation   DVT/GI PRX ordered -SCDs  TRANSFUSIONS AS NEEDED MONITOR FSBS ASSESS the need  for LABS as needed   Critical care provider statement:   Total critical care time: 33 minutes   Performed by: Lanney Gins MD   Critical care time was exclusive of separately billable procedures and treating other patients.   Critical care was necessary to treat or prevent imminent or life-threatening deterioration.   Critical care was time spent personally by me on the following activities: development of treatment plan with patient and/or surrogate as well as nursing, discussions with consultants, evaluation of patient's response to treatment, examination of patient, obtaining history from patient or surrogate, ordering and performing treatments and interventions, ordering and review of laboratory studies, ordering and review of radiographic studies, pulse oximetry and re-evaluation of patient's condition.      This document was prepared using Dragon voice recognition software and may include unintentional dictation errors.    Ottie Glazier, M.D.  Division of [place]

## 2021-03-20 NOTE — TOC Progression Note (Signed)
Transition of Care [facility]) - Progression Note    Patient Details  Name: [name] [name] MRN: [id number] Date of Birth: Jan 13, 1961  [facility] CM/SW [place], RN Phone Number: 03/20/2021, 8:51 AM  Clinical Narrative:   Met with the patient in the room and discussed DC plan and needs, completed readmission risk assessment, the patient lives at home with his girlfriend, has a ramp into the home and has a wheelchair, has a prosthetic leg, he goes to [facility] family to the doctor and can afford his medication, no apparent needs, TOC will be available if Needs arise          Expected Discharge Plan and Services                                                 Social Determinants of Health (SDOH) Interventions    Readmission Risk Interventions Readmission Risk Prevention Plan 08/07/2020 06/30/2020 11/09/2018  Transportation Screening Complete Complete -  HRI or [place] Work Consult for [place] Planning/Counseling - - Not Complete  SW consult not completed comments - - na  Palliative Care Screening - - Not Applicable  Medication Review Press photographer) Complete Complete -  PCP or Specialist appointment within 3-5 days of discharge Complete Complete -  HRI or Home Care Consult Complete Complete -  SW Recovery Care/Counseling Consult Complete Complete -  Palliative Care Screening Not Applicable Not Applicable -  [place] Not Applicable Not Applicable -  Some recent data might be hidden

## 2021-03-21 DIAGNOSIS — E785 Hyperlipidemia, unspecified: Secondary | ICD-10-CM

## 2021-03-21 DIAGNOSIS — I959 Hypotension, unspecified: Secondary | ICD-10-CM

## 2021-03-21 DIAGNOSIS — R778 Other specified abnormalities of plasma proteins: Secondary | ICD-10-CM

## 2021-03-21 DIAGNOSIS — E871 Hypo-osmolality and hyponatremia: Secondary | ICD-10-CM

## 2021-03-21 DIAGNOSIS — E1169 Type 2 diabetes mellitus with other specified complication: Secondary | ICD-10-CM

## 2021-03-21 DIAGNOSIS — D696 Thrombocytopenia, unspecified: Secondary | ICD-10-CM

## 2021-03-21 DIAGNOSIS — N179 Acute kidney failure, unspecified: Secondary | ICD-10-CM

## 2021-03-21 DIAGNOSIS — L97929 Non-pressure chronic ulcer of unspecified part of left lower leg with unspecified severity: Secondary | ICD-10-CM

## 2021-03-21 LAB — BASIC METABOLIC PANEL
Anion gap: 6 (ref 5–15)
BUN: 30 mg/dL — ABNORMAL HIGH (ref 6–20)
CO2: 30 mmol/L (ref 22–32)
Calcium: 8.5 mg/dL — ABNORMAL LOW (ref 8.9–10.3)
Chloride: 92 mmol/L — ABNORMAL LOW (ref 98–111)
Creatinine, Ser: 1.18 mg/dL (ref 0.61–1.24)
GFR, Estimated: >60 mL/min (ref >60)
Glucose, Bld: 106 mg/dL — ABNORMAL HIGH (ref 70–99)
Potassium: 4.2 mmol/L (ref 3.5–5.1)
Sodium: 128 mmol/L — ABNORMAL LOW (ref 135–145)

## 2021-03-21 LAB — GLUCOSE, CAPILLARY
Glucose-Capillary: 119 mg/dL — ABNORMAL HIGH (ref 70–99)
Glucose-Capillary: 275 mg/dL — ABNORMAL HIGH (ref 70–99)

## 2021-03-21 LAB — MAGNESIUM: Magnesium: 2 mg/dL (ref 1.7–2.4)

## 2021-03-21 MED ORDER — SENNA 8.6 MG PO TABS: 1.0000 | ORAL_TABLET | Freq: Every day | ORAL | Status: DC

## 2021-03-21 MED ORDER — AMOXICILLIN-POT CLAVULANATE 875-125 MG PO TABS: 1.0000 | ORAL_TABLET | Freq: Two times a day (BID) | ORAL | 0 refills | Status: DC

## 2021-03-21 MED ORDER — POLYETHYLENE GLYCOL 3350 17 G PO PACK
17.0000 g | PACK | Freq: Two times a day (BID) | ORAL | Status: DC
  Filled 2021-03-21: qty 1

## 2021-03-21 NOTE — Assessment & Plan Note (Signed)
--   Appears resolved.  Patient reports rash with prednisone.  Lungs without wheezes.  No further steroids on discharge.

## 2021-03-21 NOTE — Assessment & Plan Note (Signed)
stable °

## 2021-03-21 NOTE — Assessment & Plan Note (Signed)
--   Flat, mild, better than August, chronic elevation noted, no signs or symptoms to suggest ACS, EKG nonacute, atrial fibrillation with a chronic right bundle branch block.  No further evaluation suggested

## 2021-03-21 NOTE — Assessment & Plan Note (Addendum)
--  acute on chronic, secondary to CHF, stable, asymptomatic, follow-up as outpatient, seen earlier in August, March, April,

## 2021-03-21 NOTE — Assessment & Plan Note (Signed)
--  resolved, secondary to acute infection

## 2021-03-21 NOTE — Assessment & Plan Note (Signed)
--   Stable.  Continue apixaban, digoxin, resume carvedilol on discharge

## 2021-03-21 NOTE — Assessment & Plan Note (Signed)
--   Status post BKA.  Stable.

## 2021-03-21 NOTE — Hospital Course (Addendum)
60 year old man complicated past medical history including leg ulcers presenting with left leg pain.  Seen by vascular surgery outpatient 10/6 started on Keflex.  Presented with erythematous left leg and left-sided chest pain.  Hypotensive, admitted to ICU for antibiotics and vasopressor support. -- Rapidly improved with resolution of hypotension of unclear etiology.  Cellulitis improved, transition to oral antibiotics.  Hospitalization uncomplicated.  Stable for discharge.

## 2021-03-21 NOTE — Assessment & Plan Note (Signed)
--   Appears compensated continue digoxin, torsemide, resume carvedilol, losartan

## 2021-03-21 NOTE — Discharge Summary (Signed)
Physician Discharge Summary   Patient name: [name] [name]  Admit date:     03/18/2021  Discharge date: 03/21/2021  Attending Physician: Ivor Costa Hutto  Discharge Physician: Murray Hodgkins   PCP: [name], MD    Follow-up Information    [name], MD. Schedule an appointment as soon as possible for a visit in 1 week(s).   Specialty: Family Medicine Contact information: [address] [place] [address] [phone]        Minna Merritts, MD .   Specialty: Cardiology Contact information: [place] [address] [phone]               Recommendations at discharge: close f/u with PCP for cellulitis, see other issues below  Discharge Diagnoses Principal Problem:   Cellulitis of left lower extremity Active Problems:   COPD with acute exacerbation (HCC)   Chronic systolic CHF (congestive heart failure) (HCC)   PVD (peripheral vascular disease) ([place])   Chronic a-fib (HCC)   Type 2 diabetes mellitus with hyperlipidemia (HCC)   CAD (coronary artery disease)   Hyponatremia   Elevated troponin   Chronic ulcer of left leg (HCC)   Chest pain   AKI (acute kidney injury) ([place])   Hypotension   Thrombocytopenia ([place])   Resolved Diagnoses Resolved Problems:   CKD (chronic kidney disease), stage IIIa   Diarrhea   Hospital Course   60 year old man complicated past medical history including leg ulcers presenting with left leg pain.  Seen by vascular surgery outpatient 10/6 started on Keflex.  Presented with erythematous left leg and left-sided chest pain.  Hypotensive, admitted to ICU for antibiotics and vasopressor support. -- Rapidly improved with resolution of hypotension of unclear etiology.  Cellulitis improved, transition to oral antibiotics.  Hospitalization uncomplicated.  Stable for discharge.  * Cellulitis of left lower extremity -- Improving rapidly, appears nearly resolved at this point.   Continue oral antibiotics on discharge. -- Sepsis ruled out.  Did not meet sepsis criteria despite hypotension.  COPD with acute exacerbation ([place]) -- Appears resolved.  Patient reports rash with prednisone.  Lungs without wheezes.  No further steroids on discharge.  Chronic a-fib (HCC) -- Stable.  Continue apixaban, digoxin, resume carvedilol on discharge  PVD (peripheral vascular disease) ([place]) -- Status post BKA.  Stable.  Chronic systolic CHF (congestive heart failure) (HCC) -- Appears compensated continue digoxin, torsemide, resume carvedilol, losartan  Chronic ulcer of left leg (HCC) -- Wound care with iodine as outlined  Elevated troponin -- Flat, mild, better than August, chronic elevation noted, no signs or symptoms to suggest ACS, EKG nonacute, atrial fibrillation with a chronic right bundle branch block.  No further evaluation suggested  Hyponatremia --acute on chronic, secondary to CHF, stable, asymptomatic, follow-up as outpatient, seen earlier in August, March, April,   Type 2 diabetes mellitus with hyperlipidemia ([place]) --stable  Thrombocytopenia ([place]) --Chronic intermittent f/u as outpatient  Hypotension -- Etiology and significance unclear, sepsis was ruled out, did require vasopressor support, now appears stable.  No further evaluation suggested  AKI (acute kidney injury) ([place]) --resolved, secondary to acute infection  Chest pain -- Atypical, resolved, no signs or symptoms to suggest ACS.  EKG nonacute.  See elevated troponin above.    Procedures performed: none   Condition at discharge: good  Exam Physical Exam Constitutional:      General: He is not in acute distress.    Appearance: He is not ill-appearing or  toxic-appearing.  Cardiovascular:     Rate and Rhythm: Normal rate and regular rhythm.     Heart sounds: No murmur heard. Pulmonary:     Effort: Pulmonary effort is normal. No respiratory distress.     Breath sounds: No wheezing, rhonchi  or rales.  Abdominal:     General: There is no distension.  Musculoskeletal:     Left lower leg: No edema.  Skin:    Comments: Minimal LLE erythema. Dependent rubor seen. Dry wounds anterior leg noted.  Neurological:     Mental Status: He is alert.  Psychiatric:        Mood and Affect: Mood normal.        Behavior: Behavior normal.      Disposition: Home  Discharge time: greater than 30 minutes. Allergies as of 03/21/2021      Reactions   Dulaglutide Anaphylaxis, Diarrhea, Hives   Ace Inhibitors    hypotension   Other Itching   Skin itching associated with nitro patch   Prednisone Rash      Medication List    STOP taking these medications   cephALEXin 500 MG capsule Commonly known as: KEFLEX     TAKE these medications   albuterol 108 (90 Base) MCG/ACT inhaler Commonly known as: VENTOLIN HFA Inhale into the lungs every 6 (six) hours as needed for wheezing or shortness of breath.   amoxicillin-clavulanate 875-125 MG tablet Commonly known as: AUGMENTIN Take 1 tablet by mouth every 12 (twelve) hours.   apixaban 5 MG Tabs tablet Commonly known as: ELIQUIS Take 5 mg by mouth 2 (two) times daily.   atorvastatin 80 MG tablet Commonly known as: LIPITOR Take 1 tablet (80 mg total) by mouth daily.   carvedilol 3.125 MG tablet Commonly known as: COREG TAKE 1 TABLET BY MOUTH TWICE DAILY WITH MEALS   clopidogrel 75 MG tablet Commonly known as: PLAVIX Take 1 tablet (75 mg total) by mouth daily.   digoxin 0.125 MG tablet Commonly known as: LANOXIN Take 0.125 mg by mouth daily.   DULoxetine 60 MG capsule Commonly known as: CYMBALTA Take 60 mg by mouth daily.   ezetimibe 10 MG tablet Commonly known as: ZETIA Take 1 tablet (10 mg total) by mouth daily.   Fluticasone-Umeclidin-Vilant 100-62.5-25 MCG/INH Aepb Inhale 1 puff into the lungs daily.   gabapentin 800 MG tablet Commonly known as: NEURONTIN Take 800 mg by mouth 4 (four) times daily.    ipratropium-albuterol 0.5-2.5 (3) MG/3ML Soln Commonly known as: DUONEB Inhale 3 mLs into the lungs every 6 (six) hours as needed (wheezing/sob).   isosorbide mononitrate 30 MG 24 hr tablet Commonly known as: IMDUR Take 1 tablet (30 mg total) by mouth daily.   losartan 25 MG tablet Commonly known as: COZAAR Take 0.5 tablets (12.5 mg total) by mouth daily.   metFORMIN 500 MG 24 hr tablet Commonly known as: GLUCOPHAGE-XR Take 1,000 mg by mouth 2 (two) times daily.   metolazone 2.5 MG tablet Commonly known as: ZAROXOLYN Take 1 tablet (2.5 mg total) by mouth daily as needed. Take 1/2 hour before morning torsemide   Multi-Vitamins Tabs Take 1 tablet by mouth daily.   nitroGLYCERIN 0.4 MG SL tablet Commonly known as: NITROSTAT Place 1 tablet (0.4 mg total) under the tongue every 5 (five) minutes as needed for chest pain.   omeprazole 40 MG capsule Commonly known as: PRILOSEC Take 40 mg by mouth daily.   Oxycodone HCl 10 MG Tabs Take 10 mg by mouth every 4 (four)  hours as needed (pain).   polyethylene glycol 17 g packet Commonly known as: MIRALAX / GLYCOLAX Take 17 g by mouth daily as needed for mild constipation.   potassium chloride 10 MEQ tablet Commonly known as: KLOR-CON Take 1 tablet (10 mEq total) by mouth 2 (two) times daily.   spironolactone 25 MG tablet Commonly known as: ALDACTONE Take 0.5 tablets (12.5 mg total) by mouth daily. What changed: how much to take   tamsulosin 0.4 MG Caps capsule Commonly known as: FLOMAX Take 1 capsule (0.4 mg total) by mouth daily after supper.   torsemide 20 MG tablet Commonly known as: DEMADEX Take 40 mg by mouth daily.   traZODone 150 MG tablet Commonly known as: DESYREL Take 150 mg by mouth at bedtime.   Verquvo 10 MG Tabs Generic drug: Vericiguat TAKE 1 TABLET BY MOUTH ONCE DAILY            Discharge Care Instructions  (From admission, onward)         Start     Ordered   03/21/21 0000  Discharge wound  care:       Comments: Apply iodine from the swabsticks or swab pads from clean utility to LLE wounds.  Allow to air dry.   03/21/21 1326          DG Chest 2 View  Result Date: 03/18/2021 CLINICAL DATA:  61 year old male with chest pain EXAM: CHEST - 2 VIEW COMPARISON:  01/19/2021 FINDINGS: Cardiomediastinal silhouette unchanged with cardiomegaly. Calcifications of the aortic arch. Increased peribronchial thickening perceived at the bilateral hilar region. Similar appearance of coarsened interstitial markings. No interlobular septal thickening. No new confluent airspace disease. No pneumothorax. No large pleural effusion. No displaced fracture.  Degenerative changes of the spine IMPRESSION: Similar chronic changes and bronchial wall thickening, without evidence of acute cardiopulmonary disease Electronically Signed   By: Corrie Mckusick D.O.   On: 03/18/2021 11:50   DG Tibia/Fibula Left  Result Date: 03/18/2021 CLINICAL DATA:  Wound infection of the left calf 6 days. EXAM: LEFT TIBIA AND FIBULA - 2 VIEW COMPARISON:  None. FINDINGS: There is no evidence of fracture or dislocation. There is no osteopenia or bony destruction identified to suggest osteomyelitis. Soft tissues are unremarkable. IMPRESSION: No acute abnormality. Electronically Signed   By: Abelardo Diesel M.D.   On: 03/18/2021 12:31   Results for orders placed or performed during the hospital encounter of 03/18/21  Culture, blood (routine x 2)     Status: None (Preliminary result)   Collection Time: 03/18/21 12:01 PM   Specimen: BLOOD  Result Value Ref Range Status   Specimen Description BLOOD LEFT FA  Final   Special Requests   Final    BOTTLES DRAWN AEROBIC AND ANAEROBIC Blood Culture adequate volume   Culture   Final    NO GROWTH 3 DAYS Performed at [facility], [address], [place], [place] [address]    Report Status PENDING  Incomplete  Culture, blood (routine x 2)     Status: None (Preliminary result)    Collection Time: 03/18/21 12:02 PM   Specimen: BLOOD  Result Value Ref Range Status   Specimen Description BLOOD RIGHT FA  Final   Special Requests   Final    BOTTLES DRAWN AEROBIC AND ANAEROBIC Blood Culture adequate volume   Culture   Final    NO GROWTH 3 DAYS Performed at [facility], [address], [place], [place] [address]    Report Status PENDING  Incomplete  Resp Panel by RT-PCR (Flu A&B, Covid) Nasopharyngeal Swab     Status: None   Collection Time: 03/18/21  2:43 PM   Specimen: Nasopharyngeal Swab; Nasopharyngeal(NP) swabs in vial transport medium  Result Value Ref Range Status   SARS Coronavirus 2 by RT PCR NEGATIVE NEGATIVE Final    Comment: (NOTE) SARS-CoV-2 target nucleic acids are NOT DETECTED.  The SARS-CoV-2 RNA is generally detectable in upper respiratory specimens during the acute phase of infection. The lowest concentration of SARS-CoV-2 viral copies this assay can detect is 138 copies/mL. A negative result does not preclude SARS-Cov-2 infection and should not be used as the sole basis for treatment or other patient management decisions. A negative result may occur with  improper specimen collection/handling, submission of specimen other than nasopharyngeal swab, presence of viral mutation(s) within the areas targeted by this assay, and inadequate number of viral copies(<138 copies/mL). A negative result must be combined with clinical observations, patient history, and epidemiological information. The expected result is Negative.  Fact Sheet for Patients:  EntrepreneurPulse.com.au  Fact Sheet for Healthcare Providers:  IncredibleEmployment.be  This test is no t yet approved or cleared by the [place] FDA and  has been authorized for detection and/or diagnosis of SARS-CoV-2 by FDA under an Emergency Use Authorization (EUA). This EUA will remain  in effect (meaning this test can be used) for the duration of  the COVID-19 declaration under Section 564(b)(1) of the Act, 21 U.S.C.section 360bbb-3(b)(1), unless the authorization is terminated  or revoked sooner.       Influenza A by PCR NEGATIVE NEGATIVE Final   Influenza B by PCR NEGATIVE NEGATIVE Final    Comment: (NOTE) The Xpert Xpress SARS-CoV-2/FLU/RSV plus assay is intended as an aid in the diagnosis of influenza from Nasopharyngeal swab specimens and should not be used as a sole basis for treatment. Nasal washings and aspirates are unacceptable for Xpert Xpress SARS-CoV-2/FLU/RSV testing.  Fact Sheet for Patients: EntrepreneurPulse.com.au  Fact Sheet for Healthcare Providers: IncredibleEmployment.be  This test is not yet approved or cleared by the [place] FDA and has been authorized for detection and/or diagnosis of SARS-CoV-2 by FDA under an Emergency Use Authorization (EUA). This EUA will remain in effect (meaning this test can be used) for the duration of the COVID-19 declaration under Section 564(b)(1) of the Act, 21 U.S.C. section 360bbb-3(b)(1), unless the authorization is terminated or revoked.  Performed at [facility], [address] [place], [place] [address]   MRSA Next Gen by PCR, Nasal     Status: None   Collection Time: 03/18/21  6:07 PM   Specimen: Nasal Mucosa; Nasal Swab  Result Value Ref Range Status   MRSA by PCR Next Gen NOT DETECTED NOT DETECTED Final    Comment: (NOTE) The GeneXpert MRSA Assay (FDA approved for NASAL specimens only), is one component of a comprehensive MRSA colonization surveillance program. It is not intended to diagnose MRSA infection nor to guide or monitor treatment for MRSA infections. Test performance is not FDA approved in patients less than 42 years old. Performed at [facility], [address] [place], [place] [address]   CULTURE, BLOOD (ROUTINE X 2) w Reflex to ID Panel     Status: None (Preliminary result)    Collection Time: 03/18/21  8:20 PM   Specimen: BLOOD  Result Value Ref Range Status   Specimen Description BLOOD BLOOD RIGHT HAND  Final   Special Requests   Final    BOTTLES DRAWN  AEROBIC AND ANAEROBIC Blood Culture adequate volume   Culture   Final    NO GROWTH 3 DAYS Performed at [facility], [address] [place], [place] [address]    Report Status PENDING  Incomplete  CULTURE, BLOOD (ROUTINE X 2) w Reflex to ID Panel     Status: None (Preliminary result)   Collection Time: 03/18/21  8:20 PM   Specimen: BLOOD  Result Value Ref Range Status   Specimen Description BLOOD BLOOD LEFT HAND  Final   Special Requests   Final    BOTTLES DRAWN AEROBIC AND ANAEROBIC Blood Culture adequate volume   Culture   Final    NO GROWTH 3 DAYS Performed at [facility], [address] [place], [place] [address]    Report Status PENDING  Incomplete    Signed:  Murray Hodgkins MD.  [facility] 03/21/2021, 1:39 PM

## 2021-03-21 NOTE — Assessment & Plan Note (Addendum)
--   Atypical, resolved, no signs or symptoms to suggest ACS.  EKG nonacute.  See elevated troponin above.

## 2021-03-21 NOTE — Assessment & Plan Note (Signed)
--   Wound care with iodine as outlined

## 2021-03-21 NOTE — Assessment & Plan Note (Signed)
--  Chronic intermittent f/u as outpatient

## 2021-03-21 NOTE — Assessment & Plan Note (Signed)
--   Etiology and significance unclear, sepsis was ruled out, did require vasopressor support, now appears stable.  No further evaluation suggested

## 2021-03-21 NOTE — Assessment & Plan Note (Signed)
--   Improving rapidly, appears nearly resolved at this point.  Continue oral antibiotics on discharge. -- Sepsis ruled out.  Did not meet sepsis criteria despite hypotension.

## 2021-03-22 ENCOUNTER — Telehealth (HOSPITAL_COMMUNITY): Payer: Self-pay

## 2021-03-22 NOTE — Telephone Encounter (Signed)
Attempted to contact to set up home visit.  No answer/left message.  Will continue to try to reach.   [place] [phone]

## 2021-03-23 LAB — CULTURE, BLOOD (ROUTINE X 2)
Culture: NO GROWTH
Culture: NO GROWTH
Culture: NO GROWTH
Culture: NO GROWTH
Special Requests: ADEQUATE
Special Requests: ADEQUATE
Special Requests: ADEQUATE
Special Requests: ADEQUATE

## 2021-03-23 NOTE — Telephone Encounter (Signed)
Patient is scheduled   

## 2021-03-25 ENCOUNTER — Other Ambulatory Visit: Payer: Self-pay

## 2021-03-25 ENCOUNTER — Encounter: Payer: Self-pay | Admitting: Radiology

## 2021-03-25 ENCOUNTER — Emergency Department: Payer: Medicare Other

## 2021-03-25 ENCOUNTER — Inpatient Hospital Stay
Admission: EM | Admit: 2021-03-25 | Discharge: 2021-03-30 | DRG: 291 | Discharge status: Against Medical Advice | Payer: Medicare Other | Attending: Internal Medicine | Admitting: Internal Medicine

## 2021-03-25 DIAGNOSIS — I130 Hypertensive heart and chronic kidney disease with heart failure and stage 1 through stage 4 chronic kidney disease, or unspecified chronic kidney disease: Secondary | ICD-10-CM | POA: Diagnosis not present

## 2021-03-25 DIAGNOSIS — I959 Hypotension, unspecified: Secondary | ICD-10-CM

## 2021-03-25 DIAGNOSIS — N400 Enlarged prostate without lower urinary tract symptoms: Secondary | ICD-10-CM | POA: Diagnosis present

## 2021-03-25 DIAGNOSIS — Z20822 Contact with and (suspected) exposure to covid-19: Secondary | ICD-10-CM | POA: Diagnosis present

## 2021-03-25 DIAGNOSIS — I5023 Acute on chronic systolic (congestive) heart failure: Secondary | ICD-10-CM | POA: Diagnosis present

## 2021-03-25 DIAGNOSIS — I4821 Permanent atrial fibrillation: Secondary | ICD-10-CM | POA: Diagnosis present

## 2021-03-25 DIAGNOSIS — Z7901 Long term (current) use of anticoagulants: Secondary | ICD-10-CM

## 2021-03-25 DIAGNOSIS — Z720 Tobacco use: Secondary | ICD-10-CM | POA: Diagnosis present

## 2021-03-25 DIAGNOSIS — Z7984 Long term (current) use of oral hypoglycemic drugs: Secondary | ICD-10-CM

## 2021-03-25 DIAGNOSIS — E1151 Type 2 diabetes mellitus with diabetic peripheral angiopathy without gangrene: Secondary | ICD-10-CM | POA: Diagnosis present

## 2021-03-25 DIAGNOSIS — I5043 Acute on chronic combined systolic (congestive) and diastolic (congestive) heart failure: Secondary | ICD-10-CM | POA: Diagnosis present

## 2021-03-25 DIAGNOSIS — E11610 Type 2 diabetes mellitus with diabetic neuropathic arthropathy: Secondary | ICD-10-CM | POA: Diagnosis present

## 2021-03-25 DIAGNOSIS — E1122 Type 2 diabetes mellitus with diabetic chronic kidney disease: Secondary | ICD-10-CM | POA: Diagnosis present

## 2021-03-25 DIAGNOSIS — I248 Other forms of acute ischemic heart disease: Secondary | ICD-10-CM | POA: Diagnosis present

## 2021-03-25 DIAGNOSIS — I4510 Unspecified right bundle-branch block: Secondary | ICD-10-CM | POA: Diagnosis present

## 2021-03-25 DIAGNOSIS — I255 Ischemic cardiomyopathy: Secondary | ICD-10-CM | POA: Diagnosis present

## 2021-03-25 DIAGNOSIS — E871 Hypo-osmolality and hyponatremia: Secondary | ICD-10-CM | POA: Diagnosis present

## 2021-03-25 DIAGNOSIS — G2581 Restless legs syndrome: Secondary | ICD-10-CM | POA: Diagnosis present

## 2021-03-25 DIAGNOSIS — Z79899 Other long term (current) drug therapy: Secondary | ICD-10-CM

## 2021-03-25 DIAGNOSIS — R0789 Other chest pain: Secondary | ICD-10-CM

## 2021-03-25 DIAGNOSIS — G894 Chronic pain syndrome: Secondary | ICD-10-CM | POA: Diagnosis present

## 2021-03-25 DIAGNOSIS — Z8249 Family history of ischemic heart disease and other diseases of the circulatory system: Secondary | ICD-10-CM

## 2021-03-25 DIAGNOSIS — F17210 Nicotine dependence, cigarettes, uncomplicated: Secondary | ICD-10-CM | POA: Diagnosis present

## 2021-03-25 DIAGNOSIS — Z7902 Long term (current) use of antithrombotics/antiplatelets: Secondary | ICD-10-CM

## 2021-03-25 DIAGNOSIS — I272 Pulmonary hypertension, unspecified: Secondary | ICD-10-CM | POA: Diagnosis present

## 2021-03-25 DIAGNOSIS — Z89511 Acquired absence of right leg below knee: Secondary | ICD-10-CM

## 2021-03-25 DIAGNOSIS — I25110 Atherosclerotic heart disease of native coronary artery with unstable angina pectoris: Secondary | ICD-10-CM | POA: Diagnosis present

## 2021-03-25 DIAGNOSIS — D649 Anemia, unspecified: Secondary | ICD-10-CM | POA: Diagnosis present

## 2021-03-25 DIAGNOSIS — R0902 Hypoxemia: Secondary | ICD-10-CM | POA: Diagnosis present

## 2021-03-25 DIAGNOSIS — N1831 Chronic kidney disease, stage 3a: Secondary | ICD-10-CM | POA: Diagnosis present

## 2021-03-25 DIAGNOSIS — E785 Hyperlipidemia, unspecified: Secondary | ICD-10-CM | POA: Diagnosis present

## 2021-03-25 DIAGNOSIS — R570 Cardiogenic shock: Secondary | ICD-10-CM | POA: Diagnosis present

## 2021-03-25 DIAGNOSIS — Z7951 Long term (current) use of inhaled steroids: Secondary | ICD-10-CM

## 2021-03-25 DIAGNOSIS — Z91199 Patient's noncompliance with other medical treatment and regimen due to unspecified reason: Secondary | ICD-10-CM

## 2021-03-25 DIAGNOSIS — E875 Hyperkalemia: Secondary | ICD-10-CM | POA: Diagnosis present

## 2021-03-25 DIAGNOSIS — J810 Acute pulmonary edema: Secondary | ICD-10-CM | POA: Diagnosis not present

## 2021-03-25 DIAGNOSIS — I739 Peripheral vascular disease, unspecified: Secondary | ICD-10-CM | POA: Diagnosis present

## 2021-03-25 DIAGNOSIS — E1169 Type 2 diabetes mellitus with other specified complication: Secondary | ICD-10-CM | POA: Diagnosis present

## 2021-03-25 DIAGNOSIS — G4733 Obstructive sleep apnea (adult) (pediatric): Secondary | ICD-10-CM | POA: Diagnosis present

## 2021-03-25 DIAGNOSIS — Z955 Presence of coronary angioplasty implant and graft: Secondary | ICD-10-CM

## 2021-03-25 DIAGNOSIS — E1140 Type 2 diabetes mellitus with diabetic neuropathy, unspecified: Secondary | ICD-10-CM | POA: Diagnosis present

## 2021-03-25 DIAGNOSIS — I482 Chronic atrial fibrillation, unspecified: Secondary | ICD-10-CM | POA: Diagnosis present

## 2021-03-25 DIAGNOSIS — J449 Chronic obstructive pulmonary disease, unspecified: Secondary | ICD-10-CM | POA: Diagnosis present

## 2021-03-25 LAB — COMPREHENSIVE METABOLIC PANEL
ALT: 23 U/L (ref 0–44)
AST: 34 U/L (ref 15–41)
Albumin: 3.9 g/dL (ref 3.5–5.0)
Alkaline Phosphatase: 91 U/L (ref 38–126)
Anion gap: 11 (ref 5–15)
BUN: 39 mg/dL — ABNORMAL HIGH (ref 6–20)
CO2: 30 mmol/L (ref 22–32)
Calcium: 9 mg/dL (ref 8.9–10.3)
Chloride: 90 mmol/L — ABNORMAL LOW (ref 98–111)
Creatinine, Ser: 1.4 mg/dL — ABNORMAL HIGH (ref 0.61–1.24)
GFR, Estimated: 58 mL/min — ABNORMAL LOW (ref >60)
Glucose, Bld: 113 mg/dL — ABNORMAL HIGH (ref 70–99)
Potassium: 4.5 mmol/L (ref 3.5–5.1)
Sodium: 131 mmol/L — ABNORMAL LOW (ref 135–145)
Total Bilirubin: 1.5 mg/dL — ABNORMAL HIGH (ref 0.3–1.2)
Total Protein: 6.9 g/dL (ref 6.5–8.1)

## 2021-03-25 LAB — CBC WITH DIFFERENTIAL/PLATELET
Abs Immature Granulocytes: 0.06 10*3/uL (ref 0.00–0.07)
Basophils Absolute: 0 10*3/uL (ref 0.0–0.1)
Basophils Relative: 0 %
Eosinophils Absolute: 0.2 10*3/uL (ref 0.0–0.5)
Eosinophils Relative: 2 %
HCT: 31.6 % — ABNORMAL LOW (ref 39.0–52.0)
Hemoglobin: 10.6 g/dL — ABNORMAL LOW (ref 13.0–17.0)
Immature Granulocytes: 1 %
Lymphocytes Relative: 9 %
Lymphs Abs: 0.7 10*3/uL (ref 0.7–4.0)
MCH: 27.4 pg (ref 26.0–34.0)
MCHC: 33.5 g/dL (ref 30.0–36.0)
MCV: 81.7 fL (ref 80.0–100.0)
Monocytes Absolute: 0.5 10*3/uL (ref 0.1–1.0)
Monocytes Relative: 7 %
Neutro Abs: 6 10*3/uL (ref 1.7–7.7)
Neutrophils Relative %: 81 %
Platelets: 154 10*3/uL (ref 150–400)
RBC: 3.87 MIL/uL — ABNORMAL LOW (ref 4.22–5.81)
RDW: 21.7 % — ABNORMAL HIGH (ref 11.5–15.5)
WBC: 7.4 10*3/uL (ref 4.0–10.5)
nRBC: 0 % (ref 0.0–0.2)

## 2021-03-25 LAB — BRAIN NATRIURETIC PEPTIDE: B Natriuretic Peptide: 1115.4 pg/mL — ABNORMAL HIGH (ref 0.0–100.0)

## 2021-03-25 LAB — PROCALCITONIN: Procalcitonin: <0.1 ng/mL

## 2021-03-25 LAB — TROPONIN I (HIGH SENSITIVITY): Troponin I (High Sensitivity): 51 ng/L — ABNORMAL HIGH (ref <18)

## 2021-03-25 LAB — LACTIC ACID, PLASMA: Lactic Acid, Venous: 1.4 mmol/L (ref 0.5–1.9)

## 2021-03-25 MED ORDER — SODIUM CHLORIDE 0.9 % IV BOLUS
250.0000 mL | Freq: Once | INTRAVENOUS | Status: AC
  Administered 2021-03-26: 250 mL via INTRAVENOUS

## 2021-03-25 MED ORDER — IOHEXOL 350 MG/ML SOLN
100.0000 mL | Freq: Once | INTRAVENOUS | Status: AC | PRN
  Administered 2021-03-25: 100 mL via INTRAVENOUS

## 2021-03-25 MED ORDER — SODIUM CHLORIDE 0.9 % IV SOLN
2.0000 g | Freq: Once | INTRAVENOUS | Status: AC
  Administered 2021-03-26: 2 g via INTRAVENOUS
  Filled 2021-03-25: qty 20

## 2021-03-25 MED ORDER — NOREPINEPHRINE 4 MG/250ML-% IV SOLN
0.0000 ug/min | INTRAVENOUS | Status: DC
  Administered 2021-03-26 (×2): 2 ug/min via INTRAVENOUS
  Filled 2021-03-25 (×2): qty 250

## 2021-03-25 NOTE — ED Triage Notes (Signed)
See first nurse note for triage complaint. No distressed noted upon arrival to the triage room.

## 2021-03-25 NOTE — H&P (Signed)
NAME:  [name] [name], MRN:  [id number], DOB:  1960/11/20, LOS: 0 ADMISSION DATE:  03/25/2021, CONSULTATION DATE: 03/25/2021 REFERRING MD: Dr. Ellender Hose, CHIEF COMPLAINT: Chest pain and shortness of breath  History of Present Illness:  60 year old male presenting to [facility] ED from home after developed acute onset of general fatigue along with mild left-sided headache/chest pressure and shortness of breath while mowing his lawn earlier this afternoon. ED course: Patient also mildly hypotensive with complaints of mild shortness of breath  & chest pressure. EKG nonischemic, troponin at baseline, no leukocytosis & lactic normal.  He also presents with persistent erythema of his left lower extremity and of note had a recent admission for suspected cellulitis. medications given: 250 mL NS bolus, ceftriaxone and vancomycin IV.  Peripheral Levophed initiated for persistent hypotension Initial Vitals: T 37.3, RR 20, HR 79, BP initially stable at 142/118 > however patient quickly became hypotensive at 88/60 (66) & SPO2 94% on room air Significant labs: (Labs/ Imaging personally reviewed) I, Domingo Pulse [place], AGACNP-BC, personally viewed and interpreted this ECG. EKG Interpretation: Date: 03/25/2021, EKG Time: 19:54, Rate: 82, Rhythm: Atrial fibrillation, QRS Axis: LAD, Intervals: RBBB, ST/T Wave abnormalities: T wave inversion in anterolateral leads-unchanged from previous,  Narrative Interpretation: Atrial fibrillation with right bundle branch block Chemistry: Na+: 131, K+: 4.5, BUN/Cr.:  39/1.40, Serum CO2/ AG: 30/11 Hematology: WBC: 7.4, Hgb: 10.6,  Troponin: 51> 50, BNP: 1115.4, Lactic/ PCT: 1.4> 1.1,  COVID-19 & Influenza A/B: negative CT head without contrast 03/25/2021: Chronic atrophic changes without acute abnormality CT angio chest/abdomen/pelvis 03/25/2021: No evidence of dissection, no focal infiltrate or effusion seen, patent left iliac stent, fatty liver.  PCCM consulted for admission due  to need for vasopressor administration to support blood pressure.  Pertinent  Medical History  HFrEF COPD CKD Stage IIIa ICM PAD CAD BPH OSA Hypertension Hyperlipidemia Right subclavian artery stenosis Persistent atrial fibrillation Type 2 diabetes mellitus Tobacco abuse Unstable angina Significant Hospital Events: Including procedures, antibiotic start and stop dates in addition to other pertinent events   03/25/2021-patient admitted to ICU quiring low-dose vasopressor support in the setting of suspected cardiogenic shock  Interim History / Subjective:  Patient alert and responsive, no current complaints.  Vital signs stable on low-dose Levophed drip. Patient described chest discomfort as similar to usual discomfort but it did not relieve as quickly as usual. Assessed left lower extremity, cool to touch but reddened area remains with some trace pitting edema.  Per patient report this is improved.  Objective   Blood pressure (!) 74/61, pulse (!) 58, temperature 99.1 F (37.3 C), temperature source Oral, resp. rate 13, height '5\' 7"'$  (1.702 m), weight 81.6 kg, SpO2 100 %.       No intake or output data in the 24 hours ending 03/25/21 2351 Filed Weights   03/25/21 1955  Weight: 81.6 kg    Examination: General: Adult male, acutely ill, lying in bed NAD HEENT: MM pink/moist, anicteric, atraumatic, neck supple Neuro: A&O x 4, able to follow commands, PERRL +3, MAE-generalized weakness CV: s1s2 irregular, controlled A. fib on monitor, no r/m/g Pulm: Regular, non labored on 2 L nasal cannula, breath sounds coarse-BUL & diminished-BLL GI: soft, rounded, non tender, bs x 4 GU: Voiding in urinal with clear yellow urine Skin: LLE-reddened area below knee extending down shin to ankle, scabbed abrasions on LLE shin  Extremities: warm/dry, pulses + 2 R/P, trace edema noted LLE   Resolved Hospital Problem list     Assessment & Plan:  Cardiogenic shock secondary to acute on chronic  HFrEF Chronic atrial fibrillation-controlled PMHx: Hypertension Hypotension appears to be tied to mild cardiogenic shock.  LLE edema is improved and labs not indicative of any active infection.  BNP 1115, however patient does not appear fluid volume overloaded. -Continue peripheral Levophed PRN, to maintain MAP > 65.  Can consider switching to Neo-Synephrine if vasopressor requirements increase -Continue Eliquis twice daily -Outpatient medications on hold while hypotensive, consider restarting as tolerated -Continuous cardiac monitoring  COPD without acute exacerbation -Supplemental O2 PRN, to maintain SPO2 > 90% -Continue home fluticasone-umeclidin-vilant inhaler -Scheduled Duo Nebs  CKD stage IIIa BPH - Strict I/O's: alert provider if UOP < 0.5 mL/kg/hr - gentle IVF hydration  - Daily BMP, replace electrolytes PRN - Avoid nephrotoxic agents as able, ensure adequate renal perfusion -Outpatient Flomax continued  Type 2 Diabetes Mellitus Neuropathy - continue outpatient gabapentin for neuropathy - Monitor CBG AC, HS - SSI moderate dosing - target range while in ICU: 140-180 - follow ICU hyper/hypo-glycemia protocol  Hyperlipidemia -Continue home atorvastatin & Zetia  PAD - continue outpatient Plavix  Tobacco abuse -Smoking cessation counseling provided -Nicotine patch ordered  Best Practice (right click and "Reselect all SmartList Selections" daily)  Diet/type: Regular consistency (see orders) DVT prophylaxis: DOAC GI prophylaxis: N/A Lines: N/A Foley:  N/A Code Status:  full code Last date of multidisciplinary goals of care discussion [03/25/21]  Labs   CBC: Recent Labs  Lab 03/19/21 0505 03/20/21 0642 03/25/21 2005  WBC 5.5 6.4 7.4  NEUTROABS  --  5.4 6.0  HGB 12.7* 11.4* 10.6*  HCT 36.2* 33.1* 31.6*  MCV 79.6* 78.8* 81.7  PLT 184 134* [id number]    Basic Metabolic Panel: Recent Labs  Lab 03/19/21 0505 03/20/21 0642 03/21/21 0556 03/25/21 2005  NA 127*  124* 128* 131*  K 5.2* 4.7 4.2 4.5  CL 88* 84* 92* 90*  CO2 '29 30 30 30  '$ GLUCOSE 178* 141* 106* 113*  BUN 24* 30* 30* 39*  CREATININE 1.44* 1.49* 1.18 1.40*  CALCIUM 8.9 9.0 8.5* 9.0  MG 2.4 2.2 2.0  --   PHOS 4.2  --   --   --    GFR: Estimated Creatinine Clearance: 58.8 mL/min (A) (by C-G formula based on SCr of 1.4 mg/dL (H)). Recent Labs  Lab 03/19/21 0505 03/20/21 0642 03/25/21 2005 03/25/21 2158  PROCALCITON  --   --  <0.10  --   WBC 5.5 6.4 7.4  --   LATICACIDVEN  --   --   --  1.4    Liver Function Tests: Recent Labs  Lab 03/25/21 2005  AST 34  ALT 23  ALKPHOS 91  BILITOT 1.5*  PROT 6.9  ALBUMIN 3.9   No results for input(s): LIPASE, AMYLASE in the last 168 hours. No results for input(s): AMMONIA in the last 168 hours.  ABG    Component Value Date/Time   HCO3 36.4 (H) 08/30/2019 2158   O2SAT 79.2 06/21/2020 0602     Coagulation Profile: No results for input(s): INR, PROTIME in the last 168 hours.  Cardiac Enzymes: No results for input(s): CKTOTAL, CKMB, CKMBINDEX, TROPONINI in the last 168 hours.  HbA1C: Hemoglobin A1C  Date/Time Value Ref Range Status  04/04/2013 04:16 AM 9.8 (H) 4.2 - 6.3 % Final    Comment:    The American Diabetes Association recommends that a primary goal of therapy should be <7% and that physicians should reevaluate the treatment regimen in patients with HbA1c values consistently >  8%.    Hgb A1c MFr Bld  Date/Time Value Ref Range Status  03/18/2021 11:10 AM 6.2 (H) 4.8 - 5.6 % Final    Comment:    (NOTE)         Prediabetes: 5.7 - 6.4         Diabetes: >6.4         Glycemic control for adults with diabetes: <7.0   08/06/2020 04:03 AM 6.5 (H) 4.8 - 5.6 % Final    Comment:    (NOTE) Pre diabetes:          5.7%-6.4%  Diabetes:              >6.4%  Glycemic control for   <7.0% adults with diabetes     CBG: Recent Labs  Lab 03/20/21 1128 03/20/21 1528 03/20/21 1955 03/21/21 0732 03/21/21 1201  GLUCAP  224* 280* 192* 119* 275*    Review of Systems: Positives in BOLD  Gen: Denies fever, chills, weight change, fatigue, night sweats HEENT: Denies blurred vision, double vision, hearing loss, tinnitus, sinus congestion, rhinorrhea, sore throat, neck stiffness, dysphagia PULM: Denies shortness of breath, cough, sputum production, hemoptysis, wheezing CV: Denies chest pain, edema, orthopnea, paroxysmal nocturnal dyspnea, palpitations GI: Denies abdominal pain, nausea, vomiting, diarrhea, hematochezia, melena, constipation, change in bowel habits GU: Denies dysuria, hematuria, polyuria, oliguria, urethral discharge Endocrine: Denies hot or cold intolerance, polyuria, polyphagia or appetite change Derm: Denies rash, dry skin, scaling or peeling skin change Heme: Denies easy bruising, bleeding, bleeding gums Neuro: Denies headache, numbness, weakness, slurred speech, loss of memory or consciousness Past Medical History:  He,  has a past medical history of Acute on chronic heart failure ([place]), AKI (acute kidney injury) ([place]) (01/03/2018), Angina at rest [facility]) (08/13/2017), Arthritis, Asthma, Atherosclerosis, BPH (benign prostatic hyperplasia), Carotid arterial disease ([place]), Cellulitis of foot, left (09/27/2019), Charcot's joint of foot, right, Chronic combined systolic (congestive) and diastolic (congestive) heart failure (HCC), Chronic foot pain (Secondary Area of Pain) (Right) (01/29/2018), COPD (chronic obstructive pulmonary disease) ([place]), COPD with acute exacerbation ([place]) (01/30/2020), Coronary artery disease, Diabetic neuropathy ([place]), Diabetic ulcer of right foot ([place]), Hernia, HFrEF (heart failure with reduced ejection fraction) ([place]) (01/30/2020), Hyperlipidemia, Hypertension, Ischemic cardiomyopathy, Morbid obesity ([place]), Neuropathy, PAD (peripheral artery disease) ([place]), Persistent atrial fibrillation ([place]), Restless leg syndrome, Sepsis ([place]) (03/30/2018), Sleep apnea, Subclavian artery stenosis,  right (HCC), Syncope and collapse, Tobacco use, Type II diabetes mellitus ([place]), Unstable angina ([place]) (07/11/2016), and Varicose vein.   Surgical History:   Past Surgical History:  Procedure Laterality Date   ABDOMINAL AORTAGRAM N/A 06/23/2013   Procedure: ABDOMINAL Maxcine Ham;  Surgeon: Wellington Hampshire, MD;  Location: [place] CATH LAB;  Service: Cardiovascular;  Laterality: N/A;   AMPUTATION Right 04/01/2018   Procedure: AMPUTATION BELOW KNEE;  Surgeon: Algernon Huxley, MD;  Location: [facility] ORS;  Service: Vascular;  Laterality: Right;   AMPUTATION TOE Left 08/11/2020   Procedure: AMPUTATION TOE-2nd Toe;  Surgeon: Samara Deist, DPM;  Location: [facility] ORS;  Service: Podiatry;  Laterality: Left;   APPENDECTOMY     CARDIAC CATHETERIZATION  10/14   [place]; X1 STENT PROXIMAL RCA   CARDIAC CATHETERIZATION  02/2010   [facility]   CARDIAC CATHETERIZATION  02/21/2014   [facility]   CLAVICLE SURGERY     HERNIA REPAIR     IRRIGATION AND DEBRIDEMENT FOOT Right 01/04/2018   Procedure: IRRIGATION AND DEBRIDEMENT FOOT;  Surgeon: Samara Deist, DPM;  Location: [facility] ORS;  Service: Podiatry;  Laterality: Right;   LEFT  HEART CATH AND CORONARY ANGIOGRAPHY N/A 01/20/2020   Procedure: LEFT HEART CATH AND CORONARY ANGIOGRAPHY;  Surgeon: Wellington Hampshire, MD;  Location: [place] CV LAB;  Service: Cardiovascular;  Laterality: N/A;   LEFT HEART CATH AND CORS/GRAFTS ANGIOGRAPHY N/A 04/28/2017   Procedure: LEFT HEART CATH AND CORONARY ANGIOGRAPHY;  Surgeon: Wellington Hampshire, MD;  Location: [place] CV LAB;  Service: Cardiovascular;  Laterality: N/A;   LOWER EXTREMITY ANGIOGRAPHY Right 03/03/2018   Procedure: LOWER EXTREMITY ANGIOGRAPHY;  Surgeon: Katha Cabal, MD;  Location: [place] CV LAB;  Service: Cardiovascular;  Laterality: Right;   LOWER EXTREMITY ANGIOGRAPHY Left 08/24/2018   Procedure: LOWER EXTREMITY ANGIOGRAPHY;  Surgeon: Algernon Huxley, MD;  Location: [place] CV LAB;  Service: Cardiovascular;  Laterality:  Left;   LOWER EXTREMITY ANGIOGRAPHY Left 06/14/2019   Procedure: LOWER EXTREMITY ANGIOGRAPHY;  Surgeon: Algernon Huxley, MD;  Location: [place] CV LAB;  Service: Cardiovascular;  Laterality: Left;   LOWER EXTREMITY ANGIOGRAPHY Left 08/02/2019   Procedure: LOWER EXTREMITY ANGIOGRAPHY;  Surgeon: Algernon Huxley, MD;  Location: [place] CV LAB;  Service: Cardiovascular;  Laterality: Left;   LOWER EXTREMITY ANGIOGRAPHY Left 07/17/2020   Procedure: LOWER EXTREMITY ANGIOGRAPHY;  Surgeon: Algernon Huxley, MD;  Location: [place] CV LAB;  Service: Cardiovascular;  Laterality: Left;   PERIPHERAL ARTERIAL STENT GRAFT     x2 left/right   PERIPHERAL VASCULAR BALLOON ANGIOPLASTY Right 01/06/2018   Procedure: PERIPHERAL VASCULAR BALLOON ANGIOPLASTY;  Surgeon: Katha Cabal, MD;  Location: [place] CV LAB;  Service: Cardiovascular;  Laterality: Right;   PERIPHERAL VASCULAR CATHETERIZATION N/A 07/06/2015   Procedure: Abdominal Aortogram w/Lower Extremity;  Surgeon: Algernon Huxley, MD;  Location: [place] CV LAB;  Service: Cardiovascular;  Laterality: N/A;   PERIPHERAL VASCULAR CATHETERIZATION  07/06/2015   Procedure: Lower Extremity Intervention;  Surgeon: Algernon Huxley, MD;  Location: [place] CV LAB;  Service: Cardiovascular;;     Social History:   reports that he has been smoking cigarettes. He has a 41.00 pack-year smoking history. He has never used smokeless tobacco. He reports that he does not drink alcohol and does not use drugs.   Family History:  His family history includes Heart attack in his father; Heart disease in his father; Hyperlipidemia in his father and mother; Hypertension in his father and mother.   Allergies Allergies  Allergen Reactions   Dulaglutide Anaphylaxis, Diarrhea and Hives   Ace Inhibitors     hypotension   Other Itching    Skin itching associated with nitro patch   Prednisone Rash     Home Medications  Prior to Admission medications   Medication Sig  Start Date End Date Taking? Authorizing Provider  albuterol (VENTOLIN HFA) 108 (90 Base) MCG/ACT inhaler Inhale into the lungs every 6 (six) hours as needed for wheezing or shortness of breath.    [provider]  amoxicillin-clavulanate (AUGMENTIN) 875-125 MG tablet Take 1 tablet by mouth every 12 (twelve) hours. 03/21/21   Samuella Cota, MD  apixaban (ELIQUIS) 5 MG TABS tablet Take 5 mg by mouth 2 (two) times daily.    [provider]  atorvastatin (LIPITOR) 80 MG tablet Take 1 tablet (80 mg total) by mouth daily. 10/09/20   Minna Merritts, MD  carvedilol (COREG) 3.125 MG tablet TAKE 1 TABLET BY MOUTH TWICE DAILY WITH MEALS 03/16/21   Minna Merritts, MD  clopidogrel (PLAVIX) 75 MG tablet Take 1 tablet (75 mg total) by mouth  daily. 10/09/20   Minna Merritts, MD  digoxin (LANOXIN) 0.125 MG tablet Take 0.125 mg by mouth daily.    [provider]  DULoxetine (CYMBALTA) 60 MG capsule Take 60 mg by mouth daily.    [provider]  ezetimibe (ZETIA) 10 MG tablet Take 1 tablet (10 mg total) by mouth daily. 10/09/20   Minna Merritts, MD  Fluticasone-Umeclidin-Vilant 100-62.5-25 MCG/INH AEPB Inhale 1 puff into the lungs daily.    [provider]  gabapentin (NEURONTIN) 800 MG tablet Take 800 mg by mouth 4 (four) times daily.  08/14/17 04/04/29  [provider]  ipratropium-albuterol (DUONEB) 0.5-2.5 (3) MG/3ML SOLN Inhale 3 mLs into the lungs every 6 (six) hours as needed (wheezing/sob).     [provider]  isosorbide mononitrate (IMDUR) 30 MG 24 hr tablet Take 1 tablet (30 mg total) by mouth daily. 10/09/20   Minna Merritts, MD  losartan (COZAAR) 25 MG tablet Take 0.5 tablets (12.5 mg total) by mouth daily. 10/09/20   Minna Merritts, MD  metFORMIN (GLUCOPHAGE-XR) 500 MG 24 hr tablet Take 1,000 mg by mouth 2 (two) times daily. 01/02/21   [provider]  metolazone (ZAROXOLYN) 2.5 MG tablet Take 1 tablet (2.5 mg total) by mouth  daily as needed. Take 1/2 hour before morning torsemide 01/23/21 04/23/21  Alisa Graff, FNP  Multiple Vitamin (MULTI-VITAMINS) TABS Take 1 tablet by mouth daily.     [provider]  nitroGLYCERIN (NITROSTAT) 0.4 MG SL tablet Place 1 tablet (0.4 mg total) under the tongue every 5 (five) minutes as needed for chest pain. 10/09/20   Minna Merritts, MD  omeprazole (PRILOSEC) 40 MG capsule Take 40 mg by mouth daily.    [provider]  Oxycodone HCl 10 MG TABS Take 10 mg by mouth every 4 (four) hours as needed (pain).    [provider]  polyethylene glycol (MIRALAX / GLYCOLAX) 17 g packet Take 17 g by mouth daily as needed for mild constipation. 08/12/20   Loletha Grayer, MD  potassium chloride (KLOR-CON) 10 MEQ tablet Take 1 tablet (10 mEq total) by mouth 2 (two) times daily. 10/09/20   Minna Merritts, MD  spironolactone (ALDACTONE) 25 MG tablet Take 0.5 tablets (12.5 mg total) by mouth daily. Patient taking differently: Take 25 mg by mouth daily. 10/09/20   Minna Merritts, MD  tamsulosin (FLOMAX) 0.4 MG CAPS capsule Take 1 capsule (0.4 mg total) by mouth daily after supper. 07/01/20   Shawna Clamp, MD  torsemide (DEMADEX) 20 MG tablet Take 40 mg by mouth daily.    [provider]  traZODone (DESYREL) 150 MG tablet Take 150 mg by mouth at bedtime. 07/12/19   [provider]  VERQUVO 10 MG TABS TAKE 1 TABLET BY MOUTH ONCE DAILY 11/16/20   Alisa Graff, [place]     Critical care time: 62 minutes       Venetia Night, AGACNP-BC Acute Care Nurse Practitioner [facility]   [phone] / [phone] Please see Amion for pager details.

## 2021-03-25 NOTE — ED Triage Notes (Signed)
Pt to ED via POV with c/o possible covid exposure. Pt's family member reports RA sats at home 81-84% on RA, pt 93% on RA on arrival to ED. Pt with noted weakness on arrival to ED, refused wheelchair on arrival.

## 2021-03-25 NOTE — ED Notes (Signed)
MD made aware of patient's BP.  Per recent medical charts, patient has a low BP at baseline.  Will continue to monitor

## 2021-03-25 NOTE — ED Provider Notes (Signed)
[facility] Emergency Department Provider Note  ____________________________________________   None    (approximate)  I have reviewed the triage vital signs and the nursing notes.   HISTORY  Chief Complaint Chest Pain and Shortness of Breath    HPI [name] [name] is a 60 y.o. male with past medical history as below including CHF, CKD, COPD, CAD, here with multiple complaints.  The patient states that he was mowing his grass earlier this afternoon.  He reports that he developed fairly acute onset of general fatigue along with a mild left-sided headache.  This headache moved down towards his chest.  He had some associated chest pressure and mild shortness of breath.  This is persisted since onset.  He also notes that he has felt like his abdomen has been swollen and distended, and this is worse just since the onset of the pain.  He has had some constipation related to chronic pain meds.  Reports he is also noticed persistent swelling in his left leg although he was just hospitalized for cellulitis and believes that the redness is actually improving.  Denies any fevers.'s been taking his medications as prescribed.  He does not know whether he has gained or lost any weight recently.    Past Medical History:  Diagnosis Date   Acute on chronic heart failure ([place])    AKI (acute kidney injury) ([place]) 01/03/2018   Angina at rest [facility]) 08/13/2017   Arthritis    Asthma    Atherosclerosis    BPH (benign prostatic hyperplasia)    Carotid arterial disease ([place])    Cellulitis of foot, left 09/27/2019   Charcot's joint of foot, right    Chronic combined systolic (congestive) and diastolic (congestive) heart failure ([place])    a. 02/2013 EF 50% by LV gram; b. 04/2017 Echo: EF 25-30%. diff HK. Gr2 DD; c. 06/2020 Echo: EF <20%, glob HK. RVS 53.25mHg. Sev BAE. Mild to mod TR.   Chronic foot pain (Secondary Area of Pain) (Right) 01/29/2018   COPD (chronic obstructive pulmonary  disease) (HCC)    COPD with acute exacerbation ([place] 01/30/2020   Coronary artery disease    a. 2013 S/P PCI of LAD ([facility];  b. 03/2013 PCI: RCA 90p ( 2.5 x 23 mm DES); c. 02/2014 Cath: patent RCA stent->Med Rx; d. 04/2017 Cath: LM nl, LAd 30ost/p, 635m, D1 80ost, LCX 90p/m, OM1 85, RCA 70/20p, 8030m0d->Referred for CT Surg-felt to be poor candidate; d. 01/2020 Cath: LM nl, LAD 20p/m, 60d. D1 80. LCX 90p/m. LPAV 95, RCA 80/20p, 80p/m.   Diabetic neuropathy (HCCLetts  Diabetic ulcer of right foot (HCCStapleton  Hernia    HFrEF (heart failure with reduced ejection fraction) (HCCF[place]8/22/2021   Hyperlipidemia    Hypertension    Ischemic cardiomyopathy    a. 02/2013 EF 50% by LV gram; b. 04/2017 Echo: EF 25-30%. diff HK. Gr2 DD; c. 06/2020 Echo: EF <20%, glob HK. RVS 53.2mm31m Sev BAE. Mild to mod TR.   Morbid obesity (HCC)[place] Neuropathy    PAD (peripheral artery disease) (HCC)[place] a. Followed by Dr. Dew;Lucky Cowboy 08/2010 Periph Angio: RSFA 70-80p (6X60 self-expanding stent); c. 09/2010 Periph Angio: L SFA 100p (7x unknown length self-expanding stent); d. 06/2015 Periph Angio: R SFA short segment occlusion (6x12 self-expanding stent).   Persistent atrial fibrillation (HCC)    Restless leg syndrome    Sepsis (HCC)[facility]/21/2019   Sleep apnea    Subclavian  artery stenosis, right (HCC)    Syncope and collapse    Tobacco use    a. 75+ yr hx - still smoking 1ppd, down from 2 ppd.   Type II diabetes mellitus (HCC)    Unstable angina ([place]) 07/11/2016   Varicose vein     Patient Active Problem List   Diagnosis Date Noted   Thrombocytopenia ([place]) 03/21/2021   Cellulitis of left lower extremity 03/18/2021   Chronic ulcer of left leg ([place]) 03/18/2021   Hypotension 03/18/2021   BPH (benign prostatic hyperplasia)    Depression    Elevated troponin    Acquired absence of right leg below knee ([place]) 08/22/2020   Pyogenic inflammation of bone ([name])    Chronic a-fib ([place])    Demand ischemia ([place])    Palliative care by  specialist    CHF (congestive heart failure) ([place]) 06/18/2020   Chronic anticoagulation 06/17/2020   Chronic venous insufficiency 02/10/2020   COPD with acute exacerbation ([place]) 01/30/2020   Persistent atrial fibrillation ([place]) 01/19/2020   Chronic bilateral low back pain with sciatica 10/08/2019   Medication management contract signed 10/08/2019   Hyponatremia 09/27/2019   CAD (coronary artery disease) 08/31/2019   Acute on chronic systolic CHF (congestive heart failure) ([place]) 11/05/2018   Open toe wound 10/01/2018   Venous ulcer of left leg ([place]) 08/19/2018   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[place]) 04/28/2018   Abnormal MRI, shoulder (Right) 02/23/2018   Abnormal MRI, cervical spine (2016) 02/23/2018   DDD (degenerative disc disease), cervical 02/23/2018   Cervical foraminal stenosis (Bilateral) 02/23/2018   Cervical central spinal stenosis 02/23/2018   Cervical facet hypertrophy 02/23/2018   Arthralgia of acromioclavicular joint (Right) 02/23/2018   Osteoarthritis of  AC (acromioclavicular) joint (Right) 02/23/2018   Biceps tendinosis of shoulder (Right) 02/23/2018   Tendinopathy of rotator cuff (Right) 02/23/2018   Vitamin D deficiency 02/23/2018   Atherosclerosis of native arteries of the extremities with ulceration ([place]) 02/15/2018   Peripheral edema 02/05/2018   Status post peripherally inserted central catheter (PICC) central line placement 02/04/2018   Chronic lower extremity pain (Primary Area of Pain) (Right) 01/29/2018   Chronic shoulder pain (Tertiary Area of Pain) (Right) 01/29/2018   Chronic pain syndrome 01/29/2018   Long term current use of opiate analgesic 01/29/2018   Medication monitoring encounter 01/29/2018   Disorder of skeletal system 01/29/2018   Problems influencing health status 01/29/2018   AKI (acute kidney injury) ([place]) 01/03/2018   NSTEMI (non-ST elevated myocardial infarction) ([place]) 11/21/2017   HLD (hyperlipidemia) 09/10/2017    Angina pectoris ([place]) 09/08/2017   Lymphedema [id number]   Chronic systolic CHF (congestive heart failure) ([place]) 04/23/2017   Chest pain 07/11/2016   Foot ulcer (Right)    Stable angina (HCC)    Pressure injury of skin 05/06/2016   Diabetic foot infection ([place]) 05/05/2016   Type 2 diabetes mellitus with hyperlipidemia ([place]) 01/26/2016   Cellulitis 11/21/2015   Morbid obesity ([place]) 11/17/2014   PAD (peripheral artery disease) ([place])    Hypertension    Tobacco use    COPD (chronic obstructive pulmonary disease) ([place]) 05/19/2012   Diabetic neuropathy ([place]) 05/19/2012   PVD (peripheral vascular disease) ([place]) 03/27/2012    Past Surgical History:  Procedure Laterality Date   ABDOMINAL AORTAGRAM N/A 06/23/2013   Procedure: ABDOMINAL Maxcine Ham;  Surgeon: Wellington Hampshire, MD;  Location: [facility] CATH LAB;  Service: Cardiovascular;  Laterality: N/A;   AMPUTATION Right 04/01/2018   Procedure: AMPUTATION BELOW KNEE;  Surgeon: Lucky Cowboy,  Erskine Squibb, MD;  Location: [facility] ORS;  Service: Vascular;  Laterality: Right;   AMPUTATION TOE Left 08/11/2020   Procedure: AMPUTATION TOE-2nd Toe;  Surgeon: Samara Deist, DPM;  Location: [facility] ORS;  Service: Podiatry;  Laterality: Left;   APPENDECTOMY     CARDIAC CATHETERIZATION  10/14   [place]; X1 STENT PROXIMAL RCA   CARDIAC CATHETERIZATION  02/2010   [facility]   CARDIAC [facility]ATION  [facility]21/2014   [facility]   CLAVICLE SURGERY     HERNIA REPAIR     IRRIGATION AND DEBRIDEMENT FOOT Right 01/04/2018   Procedure: IRRIGATION AND DEBRIDEMENT FOOT;  Surgeon: Samara Deist, DPM;  Location: [facility] ORS;  Service: Podiatry;  Laterality: Right;   LEFT HEART CATH AND CORONARY ANGIOGRAPHY N/A 01/20/2020   Procedure: LEFT HEART CATH AND CORONARY ANGIOGRAPHY;  Surgeon: Wellington Hampshire, MD;  Location: [facility] CV LAB;  Service: Cardiovascular;  Laterality: N/A;   LEFT HEART CATH AND CORS/GRAFTS ANGIOGRAPHY N/A 04/28/2017   Procedure: LEFT HEART CATH AND CORONARY ANGIOGRAPHY;  Surgeon: Wellington Hampshire, MD;  Location: [place] CV LAB;  Service: Cardiovascular;  Laterality: N/A;   LOWER EXTREMITY ANGIOGRAPHY Right 03/03/2018   Procedure: LOWER EXTREMITY ANGIOGRAPHY;  Surgeon: Katha Cabal, MD;  Location: [place] CV LAB;  Service: Cardiovascular;  Laterality: Right;   LOWER EXTREMITY ANGIOGRAPHY Left 08/24/2018   Procedure: LOWER EXTREMITY ANGIOGRAPHY;  Surgeon: Algernon Huxley, MD;  Location: [place] CV LAB;  Service: Cardiovascular;  Laterality: Left;   LOWER EXTREMITY ANGIOGRAPHY Left 06/14/2019   Procedure: LOWER EXTREMITY ANGIOGRAPHY;  Surgeon: Algernon Huxley, MD;  Location: [place] CV LAB;  Service: Cardiovascular;  Laterality: Left;   LOWER EXTREMITY ANGIOGRAPHY Left 08/02/2019   Procedure: LOWER EXTREMITY ANGIOGRAPHY;  Surgeon: Algernon Huxley, MD;  Location: [place] CV LAB;  Service: Cardiovascular;  Laterality: Left;   LOWER EXTREMITY ANGIOGRAPHY Left 07/17/2020   Procedure: LOWER EXTREMITY ANGIOGRAPHY;  Surgeon: Algernon Huxley, MD;  Location: [place] CV LAB;  Service: Cardiovascular;  Laterality: Left;   PERIPHERAL ARTERIAL STENT GRAFT     x2 left/right   PERIPHERAL VASCULAR BALLOON ANGIOPLASTY Right 01/06/2018   Procedure: PERIPHERAL VASCULAR BALLOON ANGIOPLASTY;  Surgeon: Katha Cabal, MD;  Location: [place] CV LAB;  Service: Cardiovascular;  Laterality: Right;   PERIPHERAL VASCULAR CATHETERIZATION N/A 07/06/2015   Procedure: Abdominal Aortogram w/Lower Extremity;  Surgeon: Algernon Huxley, MD;  Location: [place] CV LAB;  Service: Cardiovascular;  Laterality: N/A;   PERIPHERAL VASCULAR CATHETERIZATION  07/06/2015   Procedure: Lower Extremity Intervention;  Surgeon: Algernon Huxley, MD;  Location: [place] CV LAB;  Service: Cardiovascular;;    Prior to Admission medications   Medication Sig Start Date End Date Taking? Authorizing Provider  albuterol (VENTOLIN HFA) 108 (90 Base) MCG/ACT inhaler Inhale into the lungs every 6 (six) hours  as needed for wheezing or shortness of breath.    [provider]  amoxicillin-clavulanate (AUGMENTIN) 875-125 MG tablet Take 1 tablet by mouth every 12 (twelve) hours. 03/21/21   Samuella Cota, MD  apixaban (ELIQUIS) 5 MG TABS tablet Take 5 mg by mouth 2 (two) times daily.    [provider]  atorvastatin (LIPITOR) 80 MG tablet Take 1 tablet (80 mg total) by mouth daily. 10/09/20   Minna Merritts, MD  carvedilol (COREG) 3.125 MG tablet TAKE 1 TABLET BY MOUTH TWICE DAILY WITH MEALS 03/16/21   Minna Merritts, MD  clopidogrel (PLAVIX) 75 MG tablet Take 1 tablet (  75 mg total) by mouth daily. 10/09/20   Minna Merritts, MD  digoxin (LANOXIN) 0.125 MG tablet Take 0.125 mg by mouth daily.    [provider]  DULoxetine (CYMBALTA) 60 MG capsule Take 60 mg by mouth daily.    [provider]  ezetimibe (ZETIA) 10 MG tablet Take 1 tablet (10 mg total) by mouth daily. 10/09/20   Minna Merritts, MD  Fluticasone-Umeclidin-Vilant 100-62.5-25 MCG/INH AEPB Inhale 1 puff into the lungs daily.    [provider]  gabapentin (NEURONTIN) 800 MG tablet Take 800 mg by mouth 4 (four) times daily.  08/14/17 04/04/29  [provider]  ipratropium-albuterol (DUONEB) 0.5-2.5 (3) MG/3ML SOLN Inhale 3 mLs into the lungs every 6 (six) hours as needed (wheezing/sob).     [provider]  isosorbide mononitrate (IMDUR) 30 MG 24 hr tablet Take 1 tablet (30 mg total) by mouth daily. 10/09/20   Minna Merritts, MD  losartan (COZAAR) 25 MG tablet Take 0.5 tablets (12.5 mg total) by mouth daily. 10/09/20   Minna Merritts, MD  metFORMIN (GLUCOPHAGE-XR) 500 MG 24 hr tablet Take 1,000 mg by mouth 2 (two) times daily. 01/02/21   [provider]  metolazone (ZAROXOLYN) 2.5 MG tablet Take 1 tablet (2.5 mg total) by mouth daily as needed. Take 1/2 hour before morning torsemide 01/23/21 04/23/21  Alisa Graff, FNP  Multiple Vitamin (MULTI-VITAMINS) TABS Take 1  tablet by mouth daily.     [provider]  nitroGLYCERIN (NITROSTAT) 0.4 MG SL tablet Place 1 tablet (0.4 mg total) under the tongue every 5 (five) minutes as needed for chest pain. 10/09/20   Minna Merritts, MD  omeprazole (PRILOSEC) 40 MG capsule Take 40 mg by mouth daily.    [provider]  Oxycodone HCl 10 MG TABS Take 10 mg by mouth every 4 (four) hours as needed (pain).    [provider]  polyethylene glycol (MIRALAX / GLYCOLAX) 17 g packet Take 17 g by mouth daily as needed for mild constipation. 08/12/20   Loletha Grayer, MD  potassium chloride (KLOR-CON) 10 MEQ tablet Take 1 tablet (10 mEq total) by mouth 2 (two) times daily. 10/09/20   Minna Merritts, MD  spironolactone (ALDACTONE) 25 MG tablet Take 0.5 tablets (12.5 mg total) by mouth daily. Patient taking differently: Take 25 mg by mouth daily. 10/09/20   Minna Merritts, MD  tamsulosin (FLOMAX) 0.4 MG CAPS capsule Take 1 capsule (0.4 mg total) by mouth daily after supper. 07/01/20   Shawna Clamp, MD  torsemide (DEMADEX) 20 MG tablet Take 40 mg by mouth daily.    [provider]  traZODone (DESYREL) 150 MG tablet Take 150 mg by mouth at bedtime. 07/12/19   [provider]  VERQUVO 10 MG TABS TAKE 1 TABLET BY MOUTH ONCE DAILY 11/16/20   Darylene Price A, FNP    Allergies Dulaglutide, Ace inhibitors, Other, and Prednisone  Family History  Problem Relation Age of Onset   Heart attack Father    Heart disease Father    Hypertension Father    Hyperlipidemia Father    Hypertension Mother    Hyperlipidemia Mother     Social History Social History   Tobacco Use   Smoking status: Every Day    Packs/day: 1.00    Years: 41.00    Pack years: 41.00    Types: Cigarettes   Smokeless tobacco: Never  Vaping Use   Vaping Use: Never used  Substance Use  Topics   Alcohol use: No   Drug use: No    Review of Systems  Review of Systems  Constitutional:  Positive for fatigue. Negative for  chills and fever.  HENT:  Negative for sore throat.   Respiratory:  Positive for cough and shortness of breath.   Cardiovascular:  Positive for chest pain.  Gastrointestinal:  Positive for nausea. Negative for abdominal pain.  Genitourinary:  Negative for flank pain.  Musculoskeletal:  Negative for neck pain.  Skin:  Negative for rash and wound.  Allergic/Immunologic: Negative for immunocompromised state.  Neurological:  Positive for headaches. Negative for weakness and numbness.  Hematological:  Does not bruise/bleed easily.    ____________________________________________  PHYSICAL EXAM:      VITAL SIGNS: ED Triage Vitals  Enc Vitals Group     BP 03/25/21 1957 (!) 142/118     Pulse Rate 03/25/21 1957 79     Resp 03/25/21 1957 20     Temp 03/25/21 1957 99.1 F (37.3 C)     Temp Source 03/25/21 1957 Oral     SpO2 03/25/21 1957 94 %     Weight 03/25/21 1955 180 lb (81.6 kg)     Height 03/25/21 1955 '5\' 7"'$  (1.702 m)     Head Circumference --      Peak Flow --      Pain Score 03/25/21 1954 5     Pain Loc --      Pain Edu? --      Excl. in [place]? --      Physical Exam Vitals and nursing note reviewed.  Constitutional:      General: He is not in acute distress.    Appearance: He is well-developed.  HENT:     Head: Normocephalic and atraumatic.  Eyes:     Conjunctiva/sclera: Conjunctivae normal.  Cardiovascular:     Rate and Rhythm: Normal rate and regular rhythm.     Heart sounds: Normal heart sounds. No murmur heard.   No friction rub.  Pulmonary:     Effort: Pulmonary effort is normal. No respiratory distress.     Breath sounds: Examination of the right-middle field reveals rales. Examination of the left-middle field reveals rales. Examination of the right-lower field reveals rales. Examination of the left-lower field reveals rales. Decreased breath sounds and rales present. No wheezing.  Abdominal:     General: There is no distension.     Palpations: Abdomen is soft.      Tenderness: There is no abdominal tenderness.     Comments: Distended, possible fluid wave.  No rigidity or guarding.  Musculoskeletal:     Cervical back: Neck supple.     Left lower leg: Edema (2+ pitting edema. diffuse erythema of left leg noted as well with mild warmth.) present.  Skin:    General: Skin is warm.     Capillary Refill: Capillary refill takes less than 2 seconds.  Neurological:     Mental Status: He is alert and oriented to person, place, and time.     Motor: No abnormal muscle tone.      ____________________________________________   LABS (all labs ordered are listed, but only abnormal results are displayed)  Labs Reviewed  CBC WITH DIFFERENTIAL/PLATELET - Abnormal; Notable for the following components:      Result Value   RBC 3.87 (*)    Hemoglobin 10.6 (*)    HCT 31.6 (*)    RDW 21.7 (*)    All other components within normal limits  COMPREHENSIVE METABOLIC PANEL - Abnormal; Notable for the following components:   Sodium 131 (*)    Chloride 90 (*)    Glucose, Bld 113 (*)    BUN 39 (*)    Creatinine, Ser 1.40 (*)    Total Bilirubin 1.5 (*)    GFR, Estimated 58 (*)    All other components within normal limits  BRAIN NATRIURETIC PEPTIDE - Abnormal; Notable for the following components:   B Natriuretic Peptide 1,115.4 (*)    All other components within normal limits  TROPONIN I (HIGH SENSITIVITY) - Abnormal; Notable for the following components:   Troponin I (High Sensitivity) 51 (*)    All other components within normal limits  CULTURE, BLOOD (SINGLE)  RESP PANEL BY RT-PCR (FLU A&B, COVID) ARPGX2  CULTURE, BLOOD (SINGLE)  LACTIC ACID, PLASMA  PROCALCITONIN  LACTIC ACID, PLASMA  TROPONIN I (HIGH SENSITIVITY)    ____________________________________________  EKG: Atrial fibrillation, ventricular rate 82.  QRS 148, QTc 455.  Right bundle branch block.  Old inferior infarct.  No acute changes from  prior. ________________________________________  RADIOLOGY All imaging, including plain films, CT scans, and ultrasounds, independently reviewed by me, and interpretations confirmed via formal radiology reads.  ED MD interpretation:   Chest x-ray: Slight interval increase in vascular congestion  Official radiology report(s): DG Chest 2 View  Result Date: 03/25/2021 CLINICAL DATA:  Shortness of breath, possible COVID exposure EXAM: CHEST - 2 VIEW COMPARISON:  03/18/2021 FINDINGS: Cardiac shadow is enlarged but stable. Aortic calcifications are again seen and stable. The lungs are well aerated bilaterally. Mild increase in central vascular congestion is noted without interstitial edema. No focal infiltrate or effusion is noted. No acute bony abnormality is seen. IMPRESSION: Slight increase in vascular congestion when compared with the prior exam. No other focal abnormality is noted. Electronically Signed   By: Inez Catalina M.D.   On: 03/25/2021 20:51   CT HEAD WO CONTRAST (5MM)  Result Date: 03/25/2021 CLINICAL DATA:  Headaches and history of possible COVID exposure EXAM: CT HEAD WITHOUT CONTRAST TECHNIQUE: Contiguous axial images were obtained from the base of the skull through the vertex without intravenous contrast. COMPARISON:  04/09/2015 FINDINGS: Brain: No evidence of acute infarction, hemorrhage, hydrocephalus, extra-axial collection or mass lesion/mass effect. Mild atrophic changes are noted. Vascular: No hyperdense vessel or unexpected calcification. Skull: Normal. Negative for fracture or focal lesion. Sinuses/Orbits: No acute finding. Other: None. IMPRESSION: Chronic atrophic changes without acute abnormality. Electronically Signed   By: Inez Catalina M.D.   On: 03/25/2021 22:36   CT Angio Chest/Abd/Pel for Dissection W and/or Wo Contrast  Result Date: 03/25/2021 CLINICAL DATA:  Chest and abdominal pain, possible COVID exposure EXAM: CT ANGIOGRAPHY CHEST, ABDOMEN AND PELVIS TECHNIQUE:  Non-contrast CT of the chest was initially obtained. Multidetector CT imaging through the chest, abdomen and pelvis was performed using the standard protocol during bolus administration of intravenous contrast. Multiplanar reconstructed images and MIPs were obtained and reviewed to evaluate the vascular anatomy. CONTRAST:  129m OMNIPAQUE IOHEXOL 350 MG/ML SOLN COMPARISON:  08/22/2020 FINDINGS: CTA CHEST FINDINGS Cardiovascular: Initial precontrast images demonstrates significant atherosclerotic calcifications of the thoracic aorta. Post-contrast images demonstrate no aneurysmal dilatation or dissection. Coronary calcifications are seen. The heart is mildly enlarged in size. The pulmonary artery as visualized shows no central pulmonary embolus. Mediastinum/Nodes: Thoracic inlet is within normal limits. No sizable hilar or mediastinal adenopathy is noted. A few small right hilar lymph nodes are seen and likely reactive in nature. The esophagus as  visualized is within normal limits. Lungs/Pleura: Lungs are well aerated bilaterally. No focal infiltrate or sizable effusion is seen. No parenchymal nodules are noted. Mild scarring in the left base is noted. Musculoskeletal: Mild degenerative changes of the thoracic spine are seen. No rib fractures are noted. No compression deformities are seen. Review of the MIP images confirms the above findings. CTA ABDOMEN AND PELVIS FINDINGS VASCULAR Aorta: Abdominal aorta demonstrates atherosclerotic calcifications without aneurysmal dilatation or dissection. Celiac: Atherosclerotic calcifications are noted without focal significant stenosis. SMA: Diffuse atherosclerotic calcifications are noted with areas of mild narrowing throughout the proximal superior mesenteric artery. Renals: Atherosclerotic calcifications are noted involving the renal arteries bilaterally. Mild stenoses are seen. Dual renal arteries are noted on the left. IMA: Patent without evidence of aneurysm, dissection,  vasculitis or significant stenosis. Inflow: Iliacs demonstrate scattered atherosclerotic calcification as well as evidence of prior stenting on the left. No aneurysmal dilatation is seen. Multifocal areas of moderate narrowing are seen. Veins: No specific venous abnormality is noted. Review of the MIP images confirms the above findings. NON-VASCULAR Hepatobiliary: Fatty infiltration of the liver is seen. The gallbladder is within normal limits. Pancreas: Unremarkable. No pancreatic ductal dilatation or surrounding inflammatory changes. Spleen: Normal in size without focal abnormality. Adrenals/Urinary Tract: Adrenal glands are within normal limits. Kidneys demonstrate a normal enhancement pattern bilaterally. No obstructive changes are seen. No renal calculi are noted. The bladder is well distended. Stomach/Bowel: Colon shows no obstructive or inflammatory changes. Appendix is not well visualized consistent with a prior surgical history. Small bowel and stomach appear within normal limits. Lymphatic: No significant lymphadenopathy is noted. Reproductive: Prostate is unremarkable. Other: No free fluid is noted. Changes of prior hernia repair are noted in the anterior abdominal wall. Some diastasis of the rectus muscles is seen. Musculoskeletal: No acute or significant osseous findings. Review of the MIP images confirms the above findings. IMPRESSION: CTA of the chest: No evidence of thoracic aortic aneurysm or dissection. No focal infiltrate or sizable effusion is seen. CTA of the abdomen and pelvis: Diffuse atherosclerotic calcifications involving the major vessels without aneurysm or dissection. Patent left iliac stent. Fatty liver. Changes of prior hernia repair in the midline anteriorly. Electronically Signed   By: Inez Catalina M.D.   On: 03/25/2021 22:43    ____________________________________________  PROCEDURES   Procedure(s) performed (including Critical Care):  .Critical Care Performed by: Duffy Bruce, MD Authorized by: Duffy Bruce, MD   Critical care provider statement:    Critical care time (minutes):  35   Critical care time was exclusive of:  Separately billable procedures and treating other patients   Critical care was necessary to treat or prevent imminent or life-threatening deterioration of the following conditions:  Cardiac failure, sepsis and circulatory failure   Critical care was time spent personally by me on the following activities:  Blood draw for specimens, development of treatment plan with patient or surrogate, discussions with consultants, evaluation of patient's response to treatment, examination of patient, obtaining history from patient or surrogate, ordering and performing treatments and interventions, ordering and review of radiographic studies, ordering and review of laboratory studies, re-evaluation of patient's condition, pulse oximetry and review of old charts   I assumed direction of critical care for this patient from another provider in my specialty: no     Care discussed with: admitting provider    ____________________________________________  [place] / MDM / [place] / ED COURSE  As part of my medical decision making, I reviewed  the following data within the [place] notes reviewed and incorporated, Old chart reviewed, Notes from prior ED visits, and [place] Controlled Substance Database       *[name] [name] was evaluated in Emergency Department on 03/25/2021 for the symptoms described in the history of present illness. He was evaluated in the context of the global COVID-19 pandemic, which necessitated consideration that the patient might be at risk for infection with the SARS-CoV-2 virus that causes COVID-19. Institutional protocols and algorithms that pertain to the evaluation of patients at risk for COVID-19 are in a state of rapid change based on information released by regulatory bodies including  the CDC and federal and state organizations. These policies and algorithms were followed during the patient's care in the ED.  Some ED evaluations and interventions may be delayed as a result of limited staffing during the pandemic.*     Medical Decision Making:  60 yo M here with acute headache, chest pressure, and SOB. DDx is broad. Pt mildly hypotensive here as well. Re: his chest pressure - EKG is nonischemic. Trop is at baseline. CT angio obtained and is negative - no signs of dissection or other acute abnormality. Labs reassuring. CBC without leukocytosis. LA normal. CMP with BUN/Cr at baseline. BNP markedly elevated at 1115. Procal negative.   Unclear etiology of his sx. Concern for possible symptomatic hypotension, multifactorial 2/2 his poor EF (14%), possible CHF. He also has persistent erythema of his LLE - while he states this is improved, int he setting of significant hypotension, cannot rule out ongoing infection. He has normal LA, no signs to suggest nec fasc on exam or labs. Will cover empirically while awaiting labs and response to treatment. Will admit to ICU.  ____________________________________________  FINAL CLINICAL IMPRESSION(S) / ED DIAGNOSES  Final diagnoses:  Hypotension, unspecified hypotension type  Atypical chest pain  Acute pulmonary edema (HCC)     MEDICATIONS GIVEN DURING THIS VISIT:  Medications  norepinephrine (LEVOPHED) '4mg'$  in 293m premix infusion (has no administration in time range)  sodium chloride 0.9 % bolus 250 mL (has no administration in time range)  cefTRIAXone (ROCEPHIN) 2 g in sodium chloride 0.9 % 100 mL IVPB (has no administration in time range)  iohexol (OMNIPAQUE) 350 MG/ML injection 100 mL (100 mLs Intravenous Contrast Given 03/25/21 2219)     ED Discharge Orders     None        Note:  This document was prepared using Dragon voice recognition software and may include unintentional dictation errors.   IDuffy Bruce  MD 03/25/21 [phone]

## 2021-03-26 ENCOUNTER — Inpatient Hospital Stay (HOSPITAL_COMMUNITY)
Admit: 2021-03-26 | Discharge: 2021-03-26 | Disposition: A | Discharge status: Home / Self Care | Payer: Medicare Other | Attending: Pulmonary Disease | Admitting: Pulmonary Disease

## 2021-03-26 DIAGNOSIS — Z20822 Contact with and (suspected) exposure to covid-19: Secondary | ICD-10-CM | POA: Diagnosis present

## 2021-03-26 DIAGNOSIS — R570 Cardiogenic shock: Secondary | ICD-10-CM | POA: Diagnosis present

## 2021-03-26 DIAGNOSIS — I25110 Atherosclerotic heart disease of native coronary artery with unstable angina pectoris: Secondary | ICD-10-CM | POA: Diagnosis present

## 2021-03-26 DIAGNOSIS — I959 Hypotension, unspecified: Secondary | ICD-10-CM | POA: Diagnosis not present

## 2021-03-26 DIAGNOSIS — F17210 Nicotine dependence, cigarettes, uncomplicated: Secondary | ICD-10-CM | POA: Diagnosis present

## 2021-03-26 DIAGNOSIS — D649 Anemia, unspecified: Secondary | ICD-10-CM | POA: Diagnosis present

## 2021-03-26 DIAGNOSIS — I4821 Permanent atrial fibrillation: Secondary | ICD-10-CM | POA: Diagnosis present

## 2021-03-26 DIAGNOSIS — I130 Hypertensive heart and chronic kidney disease with heart failure and stage 1 through stage 4 chronic kidney disease, or unspecified chronic kidney disease: Secondary | ICD-10-CM | POA: Diagnosis present

## 2021-03-26 DIAGNOSIS — G4733 Obstructive sleep apnea (adult) (pediatric): Secondary | ICD-10-CM | POA: Diagnosis present

## 2021-03-26 DIAGNOSIS — I482 Chronic atrial fibrillation, unspecified: Secondary | ICD-10-CM | POA: Diagnosis not present

## 2021-03-26 DIAGNOSIS — G2581 Restless legs syndrome: Secondary | ICD-10-CM | POA: Diagnosis present

## 2021-03-26 DIAGNOSIS — I5023 Acute on chronic systolic (congestive) heart failure: Secondary | ICD-10-CM | POA: Diagnosis present

## 2021-03-26 DIAGNOSIS — J810 Acute pulmonary edema: Secondary | ICD-10-CM | POA: Diagnosis present

## 2021-03-26 DIAGNOSIS — E785 Hyperlipidemia, unspecified: Secondary | ICD-10-CM | POA: Diagnosis present

## 2021-03-26 DIAGNOSIS — N400 Enlarged prostate without lower urinary tract symptoms: Secondary | ICD-10-CM | POA: Diagnosis present

## 2021-03-26 DIAGNOSIS — E1122 Type 2 diabetes mellitus with diabetic chronic kidney disease: Secondary | ICD-10-CM | POA: Diagnosis present

## 2021-03-26 DIAGNOSIS — N1831 Chronic kidney disease, stage 3a: Secondary | ICD-10-CM | POA: Diagnosis present

## 2021-03-26 DIAGNOSIS — E1140 Type 2 diabetes mellitus with diabetic neuropathy, unspecified: Secondary | ICD-10-CM | POA: Diagnosis present

## 2021-03-26 DIAGNOSIS — J449 Chronic obstructive pulmonary disease, unspecified: Secondary | ICD-10-CM | POA: Diagnosis present

## 2021-03-26 DIAGNOSIS — E1169 Type 2 diabetes mellitus with other specified complication: Secondary | ICD-10-CM | POA: Diagnosis present

## 2021-03-26 DIAGNOSIS — I248 Other forms of acute ischemic heart disease: Secondary | ICD-10-CM | POA: Diagnosis present

## 2021-03-26 DIAGNOSIS — G894 Chronic pain syndrome: Secondary | ICD-10-CM | POA: Diagnosis present

## 2021-03-26 DIAGNOSIS — E11610 Type 2 diabetes mellitus with diabetic neuropathic arthropathy: Secondary | ICD-10-CM | POA: Diagnosis present

## 2021-03-26 DIAGNOSIS — I5043 Acute on chronic combined systolic (congestive) and diastolic (congestive) heart failure: Secondary | ICD-10-CM | POA: Diagnosis present

## 2021-03-26 DIAGNOSIS — E1151 Type 2 diabetes mellitus with diabetic peripheral angiopathy without gangrene: Secondary | ICD-10-CM | POA: Diagnosis present

## 2021-03-26 DIAGNOSIS — I272 Pulmonary hypertension, unspecified: Secondary | ICD-10-CM | POA: Diagnosis present

## 2021-03-26 DIAGNOSIS — E871 Hypo-osmolality and hyponatremia: Secondary | ICD-10-CM | POA: Diagnosis present

## 2021-03-26 DIAGNOSIS — Z8249 Family history of ischemic heart disease and other diseases of the circulatory system: Secondary | ICD-10-CM | POA: Diagnosis not present

## 2021-03-26 LAB — CBG MONITORING, ED
Glucose-Capillary: 129 mg/dL — ABNORMAL HIGH (ref 70–99)
Glucose-Capillary: 147 mg/dL — ABNORMAL HIGH (ref 70–99)
Glucose-Capillary: 134 mg/dL — ABNORMAL HIGH (ref 70–99)
Glucose-Capillary: 144 mg/dL — ABNORMAL HIGH (ref 70–99)

## 2021-03-26 LAB — CBC
HCT: 34.9 % — ABNORMAL LOW (ref 39.0–52.0)
Hemoglobin: 11.6 g/dL — ABNORMAL LOW (ref 13.0–17.0)
MCH: 27.6 pg (ref 26.0–34.0)
MCHC: 33.2 g/dL (ref 30.0–36.0)
MCV: 82.9 fL (ref 80.0–100.0)
Platelets: 186 10*3/uL (ref 150–400)
RBC: 4.21 MIL/uL — ABNORMAL LOW (ref 4.22–5.81)
RDW: 21.5 % — ABNORMAL HIGH (ref 11.5–15.5)
WBC: 11.1 10*3/uL — ABNORMAL HIGH (ref 4.0–10.5)
nRBC: 0 % (ref 0.0–0.2)

## 2021-03-26 LAB — BASIC METABOLIC PANEL
Anion gap: 11 (ref 5–15)
BUN: 35 mg/dL — ABNORMAL HIGH (ref 6–20)
CO2: 29 mmol/L (ref 22–32)
Calcium: 8.9 mg/dL (ref 8.9–10.3)
Chloride: 93 mmol/L — ABNORMAL LOW (ref 98–111)
Creatinine, Ser: 1.3 mg/dL — ABNORMAL HIGH (ref 0.61–1.24)
GFR, Estimated: >60 mL/min (ref >60)
Glucose, Bld: 126 mg/dL — ABNORMAL HIGH (ref 70–99)
Potassium: 5.2 mmol/L — ABNORMAL HIGH (ref 3.5–5.1)
Sodium: 133 mmol/L — ABNORMAL LOW (ref 135–145)

## 2021-03-26 LAB — RESP PANEL BY RT-PCR (FLU A&B, COVID) ARPGX2
Influenza A by PCR: NEGATIVE
Influenza B by PCR: NEGATIVE
SARS Coronavirus 2 by RT PCR: NEGATIVE

## 2021-03-26 LAB — ECHOCARDIOGRAM COMPLETE
AR max vel: 1.15 cm2
AV Area VTI: 1.48 cm2
AV Area mean vel: 1.1 cm2
AV Mean grad: 3.7 mmHg
AV Peak grad: 6.7 mmHg
Ao pk vel: 1.3 m/s
Area-P 1/2: 4.63 cm2
Calc EF: 7.8 %
Height: 67 in
S' Lateral: 5.72 cm
Single Plane A2C EF: 2.5 %
Single Plane A4C EF: 13.4 %
Weight: 2880 oz

## 2021-03-26 LAB — PHOSPHORUS: Phosphorus: 4.5 mg/dL (ref 2.5–4.6)

## 2021-03-26 LAB — LACTIC ACID, PLASMA: Lactic Acid, Venous: 1.1 mmol/L (ref 0.5–1.9)

## 2021-03-26 LAB — TROPONIN I (HIGH SENSITIVITY): Troponin I (High Sensitivity): 50 ng/L — ABNORMAL HIGH (ref <18)

## 2021-03-26 LAB — POTASSIUM: Potassium: 4.6 mmol/L (ref 3.5–5.1)

## 2021-03-26 LAB — GLUCOSE, CAPILLARY
Glucose-Capillary: 102 mg/dL — ABNORMAL HIGH (ref 70–99)
Glucose-Capillary: 165 mg/dL — ABNORMAL HIGH (ref 70–99)

## 2021-03-26 LAB — MAGNESIUM: Magnesium: 1.9 mg/dL (ref 1.7–2.4)

## 2021-03-26 LAB — DIGOXIN LEVEL: Digoxin Level: <0.2 ng/mL — ABNORMAL LOW (ref 0.8–2.0)

## 2021-03-26 MED ORDER — VANCOMYCIN HCL 2000 MG/400ML IV SOLN
2000.0000 mg | Freq: Once | INTRAVENOUS | Status: AC
  Administered 2021-03-26: 2000 mg via INTRAVENOUS
  Filled 2021-03-26: qty 400

## 2021-03-26 MED ORDER — INSULIN ASPART 100 UNIT/ML IJ SOLN
0.0000 [IU] | Freq: Three times a day (TID) | INTRAMUSCULAR | Status: DC
  Administered 2021-03-26: 3 [IU] via SUBCUTANEOUS
  Administered 2021-03-26 – 2021-03-28 (×6): 2 [IU] via SUBCUTANEOUS
  Administered 2021-03-28: 5 [IU] via SUBCUTANEOUS
  Administered 2021-03-29 – 2021-03-30 (×4): 2 [IU] via SUBCUTANEOUS
  Filled 2021-03-26 (×12): qty 1

## 2021-03-26 MED ORDER — ONDANSETRON HCL 4 MG/2ML IJ SOLN: 4.0000 mg | Freq: Four times a day (QID) | INTRAMUSCULAR | Status: DC | PRN

## 2021-03-26 MED ORDER — UMECLIDINIUM BROMIDE 62.5 MCG/INH IN AEPB
1.0000 | INHALATION_SPRAY | Freq: Every day | RESPIRATORY_TRACT | Status: DC
  Administered 2021-03-27 – 2021-03-29 (×3): 1 via RESPIRATORY_TRACT
  Filled 2021-03-26: qty 7

## 2021-03-26 MED ORDER — FUROSEMIDE 10 MG/ML IJ SOLN
80.0000 mg | Freq: Two times a day (BID) | INTRAMUSCULAR | Status: DC
  Administered 2021-03-26 – 2021-03-28 (×4): 80 mg via INTRAVENOUS
  Filled 2021-03-26 (×4): qty 8

## 2021-03-26 MED ORDER — DULOXETINE HCL 30 MG PO CPEP
60.0000 mg | ORAL_CAPSULE | Freq: Every day | ORAL | Status: DC
  Administered 2021-03-26 – 2021-03-30 (×5): 60 mg via ORAL
  Filled 2021-03-26 (×3): qty 2
  Filled 2021-03-26: qty 1
  Filled 2021-03-26: qty 2

## 2021-03-26 MED ORDER — SODIUM CHLORIDE 0.9 % IV SOLN
250.0000 mL | INTRAVENOUS | Status: DC
  Administered 2021-03-26: 250 mL via INTRAVENOUS

## 2021-03-26 MED ORDER — FUROSEMIDE 10 MG/ML IJ SOLN: 80.0000 mg | Freq: Every day | INTRAMUSCULAR | Status: DC

## 2021-03-26 MED ORDER — EZETIMIBE 10 MG PO TABS
10.0000 mg | ORAL_TABLET | Freq: Every day | ORAL | Status: DC
  Administered 2021-03-26 – 2021-03-30 (×5): 10 mg via ORAL
  Filled 2021-03-26 (×5): qty 1

## 2021-03-26 MED ORDER — CHLORHEXIDINE GLUCONATE CLOTH 2 % EX PADS
6.0000 | MEDICATED_PAD | Freq: Every day | CUTANEOUS | Status: DC
Start: 2021-03-26 — End: 2021-03-29
  Administered 2021-03-26 – 2021-03-28 (×2): 6 via TOPICAL

## 2021-03-26 MED ORDER — PANTOPRAZOLE SODIUM 40 MG IV SOLR
40.0000 mg | Freq: Every day | INTRAVENOUS | Status: DC
  Administered 2021-03-26 – 2021-03-27 (×3): 40 mg via INTRAVENOUS
  Filled 2021-03-26 (×3): qty 40

## 2021-03-26 MED ORDER — MIDODRINE HCL 5 MG PO TABS
10.0000 mg | ORAL_TABLET | Freq: Three times a day (TID) | ORAL | Status: DC
  Administered 2021-03-26 – 2021-03-27 (×5): 10 mg via ORAL
  Filled 2021-03-26 (×5): qty 2

## 2021-03-26 MED ORDER — TAMSULOSIN HCL 0.4 MG PO CAPS
0.4000 mg | ORAL_CAPSULE | Freq: Every day | ORAL | Status: DC
  Administered 2021-03-26 – 2021-03-29 (×4): 0.4 mg via ORAL
  Filled 2021-03-26 (×4): qty 1

## 2021-03-26 MED ORDER — CLOPIDOGREL BISULFATE 75 MG PO TABS
75.0000 mg | ORAL_TABLET | Freq: Every day | ORAL | Status: DC
  Administered 2021-03-26 – 2021-03-30 (×5): 75 mg via ORAL
  Filled 2021-03-26 (×5): qty 1

## 2021-03-26 MED ORDER — FLUTICASONE-UMECLIDIN-VILANT 100-62.5-25 MCG/INH IN AEPB: 1.0000 | INHALATION_SPRAY | Freq: Every day | RESPIRATORY_TRACT | Status: DC

## 2021-03-26 MED ORDER — ATORVASTATIN CALCIUM 80 MG PO TABS
80.0000 mg | ORAL_TABLET | Freq: Every day | ORAL | Status: DC
  Administered 2021-03-26 – 2021-03-30 (×5): 80 mg via ORAL
  Filled 2021-03-26: qty 4
  Filled 2021-03-26: qty 1
  Filled 2021-03-26: qty 4
  Filled 2021-03-26 (×2): qty 1

## 2021-03-26 MED ORDER — FLUTICASONE FUROATE-VILANTEROL 100-25 MCG/INH IN AEPB
1.0000 | INHALATION_SPRAY | Freq: Every day | RESPIRATORY_TRACT | Status: DC
  Administered 2021-03-27 – 2021-03-30 (×4): 1 via RESPIRATORY_TRACT
  Filled 2021-03-26: qty 28

## 2021-03-26 MED ORDER — DIGOXIN 125 MCG PO TABS
0.1250 mg | ORAL_TABLET | Freq: Every day | ORAL | Status: DC
  Administered 2021-03-27 – 2021-03-30 (×4): 0.125 mg via ORAL
  Filled 2021-03-26 (×5): qty 1

## 2021-03-26 MED ORDER — DOCUSATE SODIUM 100 MG PO CAPS
100.0000 mg | ORAL_CAPSULE | Freq: Two times a day (BID) | ORAL | Status: DC | PRN
  Administered 2021-03-28 – 2021-03-29 (×4): 100 mg via ORAL
  Filled 2021-03-26 (×4): qty 1

## 2021-03-26 MED ORDER — ACETAMINOPHEN 325 MG PO TABS
650.0000 mg | ORAL_TABLET | Freq: Four times a day (QID) | ORAL | Status: DC | PRN
  Administered 2021-03-27 – 2021-03-30 (×3): 650 mg via ORAL
  Filled 2021-03-26 (×3): qty 2

## 2021-03-26 MED ORDER — IPRATROPIUM-ALBUTEROL 0.5-2.5 (3) MG/3ML IN SOLN
3.0000 mL | Freq: Four times a day (QID) | RESPIRATORY_TRACT | Status: DC
  Administered 2021-03-26 – 2021-03-30 (×16): 3 mL via RESPIRATORY_TRACT
  Filled 2021-03-26 (×18): qty 3

## 2021-03-26 MED ORDER — IPRATROPIUM-ALBUTEROL 0.5-2.5 (3) MG/3ML IN SOLN: 3.0000 mL | RESPIRATORY_TRACT | Status: DC | PRN

## 2021-03-26 MED ORDER — POLYETHYLENE GLYCOL 3350 17 G PO PACK
17.0000 g | PACK | Freq: Every day | ORAL | Status: DC | PRN
  Administered 2021-03-28 – 2021-03-29 (×2): 17 g via ORAL
  Filled 2021-03-26 (×2): qty 1

## 2021-03-26 MED ORDER — INSULIN ASPART 100 UNIT/ML IJ SOLN
0.0000 [IU] | INTRAMUSCULAR | Status: DC
Start: 2021-03-26 — End: 2021-03-26
  Administered 2021-03-26: 2 [IU] via SUBCUTANEOUS
  Filled 2021-03-26: qty 1

## 2021-03-26 MED ORDER — APIXABAN 5 MG PO TABS
5.0000 mg | ORAL_TABLET | Freq: Two times a day (BID) | ORAL | Status: DC
  Administered 2021-03-26 – 2021-03-30 (×9): 5 mg via ORAL
  Filled 2021-03-26 (×9): qty 1

## 2021-03-26 MED ORDER — HEPARIN SODIUM (PORCINE) 5000 UNIT/ML IJ SOLN
5000.0000 [IU] | Freq: Three times a day (TID) | INTRAMUSCULAR | Status: DC
  Administered 2021-03-26: 5000 [IU] via SUBCUTANEOUS
  Filled 2021-03-26: qty 1

## 2021-03-26 MED ORDER — TRAZODONE HCL 50 MG PO TABS
150.0000 mg | ORAL_TABLET | Freq: Every day | ORAL | Status: DC
  Administered 2021-03-26 – 2021-03-29 (×4): 150 mg via ORAL
  Filled 2021-03-26 (×4): qty 1

## 2021-03-26 MED ORDER — GABAPENTIN 400 MG PO CAPS
800.0000 mg | ORAL_CAPSULE | Freq: Four times a day (QID) | ORAL | Status: DC
  Administered 2021-03-26 – 2021-03-30 (×16): 800 mg via ORAL
  Filled 2021-03-26 (×17): qty 2

## 2021-03-26 MED ORDER — NICOTINE 21 MG/24HR TD PT24
21.0000 mg | MEDICATED_PATCH | Freq: Every day | TRANSDERMAL | Status: DC
  Administered 2021-03-27 – 2021-03-30 (×4): 21 mg via TRANSDERMAL
  Filled 2021-03-26 (×5): qty 1

## 2021-03-26 NOTE — ED Notes (Signed)
Patient noted to have a four beat run of Vtach on monitor.  Patient had no c/o.  VSS.  NP messaged to make aware.  Rhythm strip on chart

## 2021-03-26 NOTE — Progress Notes (Signed)
NAME:  [name] [name], MRN:  [id number], DOB:  05/13/1961, LOS: 0 ADMISSION DATE:  03/25/2021, CONSULTATION DATE: 03/25/2021 REFERRING MD: Dr. Ellender Hose, CHIEF COMPLAINT: Chest pain and shortness of breath  History of Present Illness:  60 year old male presenting to [facility] ED from home after developed acute onset of general fatigue along with mild left-sided headache/chest pressure and shortness of breath while mowing his lawn earlier this afternoon. ED course: Patient also mildly hypotensive with complaints of mild shortness of breath  & chest pressure. EKG nonischemic, troponin at baseline, no leukocytosis & lactic normal.  He also presents with persistent erythema of his left lower extremity and of note had a recent admission for suspected cellulitis. medications given: 250 mL NS bolus, ceftriaxone and vancomycin IV.  Peripheral Levophed initiated for persistent hypotension Initial Vitals: T 37.3, RR 20, HR 79, BP initially stable at 142/118 > however patient quickly became hypotensive at 88/60 (66) & SPO2 94% on room air Significant labs: (Labs/ Imaging personally reviewed) I, Domingo Pulse [place], AGACNP-BC, personally viewed and interpreted this ECG. EKG Interpretation: Date: 03/25/2021, EKG Time: 19:54, Rate: 82, Rhythm: Atrial fibrillation, QRS Axis: LAD, Intervals: RBBB, ST/T Wave abnormalities: T wave inversion in anterolateral leads-unchanged from previous,  Narrative Interpretation: Atrial fibrillation with right bundle branch block Chemistry: Na+: 131, K+: 4.5, BUN/Cr.:  39/1.40, Serum CO2/ AG: 30/11 Hematology: WBC: 7.4, Hgb: 10.6,  Troponin: 51> 50, BNP: 1115.4, Lactic/ PCT: 1.4> 1.1,  COVID-19 & Influenza A/B: negative CT head without contrast 03/25/2021: Chronic atrophic changes without acute abnormality CT angio chest/abdomen/pelvis 03/25/2021: No evidence of dissection, no focal infiltrate or effusion seen, patent left iliac stent, fatty liver.  PCCM consulted for admission due  to need for vasopressor administration to support blood pressure.  Pertinent  Medical History  HFrEF COPD CKD Stage IIIa ICM PAD CAD BPH OSA Hypertension Hyperlipidemia Right subclavian artery stenosis Persistent atrial fibrillation Type 2 diabetes mellitus Tobacco abuse Unstable angina Significant Hospital Events: Including procedures, antibiotic start and stop dates in addition to other pertinent events   03/25/2021-patient admitted to ICU quiring low-dose vasopressor support in the setting of suspected cardiogenic shock 03/26/2021: Continues to require Levophed (currently at 3 mcg); start Midodrine, repeat ECHO and consult Cardiology  Interim History / Subjective:  -No acute events reported overnight -Afebrile, on room air, requiring low dose Levophed (currently on 3 mcg) -Lactic acid normal, procalcitonin is negative -Creatinine slightly improved to 1.3 (1.4 yesterday) -UOP 1300 cc last 24 hrs (net - 1.1L since admit) -Assessed left lower extremity, cool to touch but reddened area remains with some trace pitting edema.  Per patient report this is much improved. -Reports cough productive of white/grey sputum & lightheadedness -Denies chest pain, palpitations, SOB, wheezing, abdominal pain, N/V/D, fever, chills -Start Midodrine -Will repeat ECHO and consult Cardiology  Objective   Blood pressure 92/66, pulse 79, temperature 99.1 F (37.3 C), temperature source Oral, resp. rate 16, height '5\' 7"'$  (1.702 m), weight 81.6 kg, SpO2 95 %.        Intake/Output Summary (Last 24 hours) at 03/26/2021 1229 Last data filed at 03/26/2021 0800 Gross per 24 hour  Intake 750 ml  Output 1900 ml  Net -1150 ml   Filed Weights   03/25/21 1955  Weight: 81.6 kg    Examination: General: Adult male, acutely ill, lying in bed, NAD HEENT: MM pink/moist, anicteric, atraumatic, neck supple Neuro: A&O x 4, able to follow commands, PERRL +3, MAE-generalized weakness CV: s1s2 irregular,  controlled A. 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  LLE edema is improved and labs not indicative of any active infection.  BNP 1115, however patient does not appear fluid volume overloaded. -Continuous cardiac monitoring -Maintain MAP >65 -Cautious IV fluids -Vasopressors as needed to maintain MAP goal -Add Midodrine -Lactic acid normalized (1.4 ~ 1.1) -HS Troponin peaked at 51 -Repeat Echocardiogram pending (Echo from 06/17/20 with LVEF <20%, indeterminate diastolic parameters, RV systolic function severely reduced and severely elevated pulmonary artery pressures) -Consult Cardiology, appreciate input -Continue home Eliquis BID -Hold home antihypertensives and BB  COPD without acute exacerbation PMHx of Asthma, COPD, OSA (does not use/does not tolerate CPAP) -Supplemental O2 PRN, to maintain SPO2 > 88% -Follow intermittent CXR & ABG as needed -Continue home fluticasone-umeclidin-vilant inhaler -Scheduled Duo Nebs  CKD stage IIIa Mild Hyperkalemia ~ resolved Mild Hyponatremia BPH -Monitor I&O's / urinary output -Follow BMP -Ensure adequate renal perfusion -Avoid nephrotoxic agents as able -Replace electrolytes as indicated -Continue home Flomax  Type 2 Diabetes  Mellitus Neuropathy - continue outpatient gabapentin for neuropathy - Monitor CBG AC, HS - SSI moderate dosing - target range while in ICU: 140-180 - follow ICU hyper/hypo-glycemia protocol  Hyperlipidemia -Continue home atorvastatin & Zetia  PAD - continue outpatient Plavix  Tobacco abuse -Smoking cessation counseling provided -Nicotine patch ordered  Best Practice (right click and "Reselect all SmartList Selections" daily)  Diet/type: Regular consistency (see orders) DVT prophylaxis: DOAC GI prophylaxis: N/A Lines: N/A Foley:  N/A Code Status:  full code Last date of multidisciplinary goals of care discussion [03/26/21]  Labs   CBC: Recent Labs  Lab 03/20/21 0642 03/25/21 2005 03/26/21 0517  WBC 6.4 7.4 11.1*  NEUTROABS 5.4 6.0  --   HGB 11.4* 10.6* 11.6*  HCT 33.1* 31.6* 34.9*  MCV 78.8* 81.7 82.9  PLT 134* 154 186     Basic Metabolic Panel: Recent Labs  Lab 03/20/21 0642 03/21/21 0556 03/25/21 2005 03/26/21 0517 03/26/21 1203  NA 124* 128* 131* 133*  --   K 4.7 4.2 4.5 5.2* 4.6  CL 84* 92* 90* 93*  --   CO2 '30 30 30 29  '$ --   GLUCOSE 141* 106* 113* 126*  --   BUN 30* 30* 39* 35*  --   CREATININE 1.49* 1.18 1.40* 1.30*  --   CALCIUM 9.0 8.5* 9.0 8.9  --   MG 2.2 2.0  --  1.9  --   PHOS  --   --   --  4.5  --     GFR: Estimated Creatinine Clearance: 63.3 mL/min (A) (by C-G formula based on SCr of 1.3 mg/dL (H)). Recent Labs  Lab 03/20/21 0642 03/25/21 2005 03/25/21 2158 03/26/21 0015 03/26/21 0517  PROCALCITON  --  <0.10  --   --   --   WBC 6.4 7.4  --   --  11.1*  LATICACIDVEN  --   --  1.4 1.1  --      Liver Function Tests: Recent Labs  Lab 03/25/21 2005  AST 34  ALT 23  ALKPHOS 91  BILITOT 1.5*  PROT 6.9  ALBUMIN 3.9    No results for input(s): LIPASE, AMYLASE in the last 168 hours. No results for input(s): AMMONIA in the last 168 hours.  ABG    Component Value Date/Time   HCO3 36.4 (H) 08/30/2019 2158   O2SAT 79.2  06/21/2020 0602      Coagulation Profile: No results for input(s): INR, PROTIME in the last 168 hours.  Cardiac Enzymes: No results for input(s): CKTOTAL, CKMB, CKMBINDEX, TROPONINI in the last 168 hours.  HbA1C: Hemoglobin A1C  Date/Time Value Ref Range Status  04/04/2013 04:16 AM 9.8 (H) 4.2 - 6.3 % Final    Comment:    The American Diabetes Association recommends that a primary goal of therapy should be <7% and that physicians should reevaluate the treatment regimen in patients with HbA1c values consistently >8%.    Hgb A1c MFr Bld  Date/Time Value Ref Range Status  03/18/2021 11:10 AM 6.2 (H) 4.8 - 5.6 % Final    Comment:    (NOTE)         Prediabetes: 5.7 - 6.4         Diabetes: >6.4         Glycemic control for adults with diabetes: <7.0   08/06/2020 04:03 AM 6.5 (H) 4.8 - 5.6 % Final    Comment:    (NOTE) Pre diabetes:          5.7%-6.4%  Diabetes:              >6.4%  Glycemic control for   <7.0% adults with diabetes     CBG: Recent Labs  Lab 03/21/21 1201 03/26/21 0140 03/26/21 0520 03/26/21 0747 03/26/21 1205  GLUCAP 275* 129* 147* 134* 144*     Review of Systems: Positives in BOLD  Gen: Denies fever, chills, weight change, fatigue, night sweats HEENT: Denies blurred vision, double vision, hearing loss, tinnitus, sinus congestion, rhinorrhea, sore throat, neck stiffness, dysphagia PULM: Denies shortness of breath, cough, sputum production, hemoptysis, wheezing CV: Denies chest pain, edema, orthopnea, paroxysmal nocturnal dyspnea, palpitations GI: Denies abdominal pain, nausea, vomiting, diarrhea, hematochezia, melena, constipation, change in bowel habits GU: Denies dysuria, hematuria, polyuria, oliguria, urethral discharge Endocrine: Denies hot or cold intolerance, polyuria, polyphagia or appetite change Derm: Denies rash, dry skin, scaling or peeling skin change Heme: Denies easy bruising, bleeding, bleeding gums Neuro: Denies headache,  numbness, weakness, slurred speech, loss of memory or consciousness Past Medical History:  He,  has a past medical history of Acute on chronic heart failure ([place]), AKI (acute kidney injury) ([place]) (01/03/2018), Angina at rest [facility]) (08/13/2017), Arthritis, Asthma, Atherosclerosis, BPH (benign prostatic hyperplasia), Carotid arterial disease ([place]), Cellulitis of foot, left (09/27/2019), Charcot's joint of foot, right, Chronic combined systolic (congestive) and diastolic (congestive) heart failure ([place]), Chronic foot pain (Secondary Area of Pain) (Right) (01/29/2018), COPD (chronic obstructive pulmonary disease) ([place]), COPD with acute exacerbation ([place]) (01/30/2020), Coronary artery disease, Diabetic neuropathy ([place]), Diabetic ulcer of right foot ([place]), Hernia, HFrEF (heart failure with reduced ejection fraction) ([place]) (01/30/2020), Hyperlipidemia, Hypertension, Ischemic cardiomyopathy, Morbid obesity ([place]), Neuropathy, PAD (peripheral artery disease) ([place]), Persistent atrial fibrillation ([place]), Restless leg syndrome, Sepsis ([place]) (03/30/2018), Sleep apnea, Subclavian artery stenosis, right (HCC), Syncope and collapse, Tobacco use, Type II diabetes mellitus ([place]), Unstable angina (  [place]) (07/11/2016), and Varicose vein.   Surgical History:   Past Surgical History:  Procedure Laterality Date   ABDOMINAL AORTAGRAM N/A 06/23/2013   Procedure: ABDOMINAL Maxcine Ham;  Surgeon: Wellington Hampshire, MD;  Location: [place] CATH LAB;  Service: Cardiovascular;  Laterality: N/A;   AMPUTATION Right 04/01/2018   Procedure: AMPUTATION BELOW KNEE;  Surgeon: Algernon Huxley, MD;  Location: [facility] ORS;  Service: Vascular;  Laterality: Right;   AMPUTATION TOE Left 08/11/2020   Procedure: AMPUTATION TOE-2nd Toe;  Surgeon: Samara Deist, DPM;  Location: [facility] ORS;  Service: Podiatry;  Laterality: Left;   APPENDECTOMY     CARDIAC CATHETERIZATION  10/14   [place]; X1 STENT PROXIMAL RCA   CARDIAC CATHETERIZATION  02/2010   [facility]   CARDIAC  CATHETERIZATION  02/21/2014   [facility]   CLAVICLE SURGERY     HERNIA REPAIR     IRRIGATION AND DEBRIDEMENT FOOT Right 01/04/2018   Procedure: IRRIGATION AND DEBRIDEMENT FOOT;  Surgeon: Samara Deist, DPM;  Location: [facility] ORS;  Service: Podiatry;  Laterality: Right;   LEFT HEART CATH AND CORONARY ANGIOGRAPHY N/A 01/20/2020   Procedure: LEFT HEART CATH AND CORONARY ANGIOGRAPHY;  Surgeon: Wellington Hampshire, MD;  Location: [place] CV LAB;  Service: Cardiovascular;  Laterality: N/A;   LEFT HEART CATH AND CORS/GRAFTS ANGIOGRAPHY N/A 04/28/2017   Procedure: LEFT HEART CATH AND CORONARY ANGIOGRAPHY;  Surgeon: Wellington Hampshire, MD;  Location: [place] CV LAB;  Service: Cardiovascular;  Laterality: N/A;   LOWER EXTREMITY ANGIOGRAPHY Right 03/03/2018   Procedure: LOWER EXTREMITY ANGIOGRAPHY;  Surgeon: Katha Cabal, MD;  Location: [place] CV LAB;  Service: Cardiovascular;  Laterality: Right;   LOWER EXTREMITY ANGIOGRAPHY Left 08/24/2018   Procedure: LOWER EXTREMITY ANGIOGRAPHY;  Surgeon: Algernon Huxley, MD;  Location: [place] CV LAB;  Service: Cardiovascular;  Laterality: Left;   LOWER EXTREMITY ANGIOGRAPHY Left 06/14/2019   Procedure: LOWER EXTREMITY ANGIOGRAPHY;  Surgeon: Algernon Huxley, MD;  Location: [place] CV LAB;  Service: Cardiovascular;  Laterality: Left;   LOWER EXTREMITY ANGIOGRAPHY Left 08/02/2019   Procedure: LOWER EXTREMITY ANGIOGRAPHY;  Surgeon: Algernon Huxley, MD;  Location: [place] CV LAB;  Service: Cardiovascular;  Laterality: Left;   LOWER EXTREMITY ANGIOGRAPHY Left 07/17/2020   Procedure: LOWER EXTREMITY ANGIOGRAPHY;  Surgeon: Algernon Huxley, MD;  Location: [place] CV LAB;  Service: Cardiovascular;  Laterality: Left;   PERIPHERAL ARTERIAL STENT GRAFT     x2 left/right   PERIPHERAL VASCULAR BALLOON ANGIOPLASTY Right 01/06/2018   Procedure: PERIPHERAL VASCULAR BALLOON ANGIOPLASTY;  Surgeon: Katha Cabal, MD;  Location: [place] CV LAB;  Service:  Cardiovascular;  Laterality: Right;   PERIPHERAL VASCULAR CATHETERIZATION N/A 07/06/2015   Procedure: Abdominal Aortogram w/Lower Extremity;  Surgeon: Algernon Huxley, MD;  Location: [place] CV LAB;  Service: Cardiovascular;  Laterality: N/A;   PERIPHERAL VASCULAR CATHETERIZATION  07/06/2015   Procedure: Lower Extremity Intervention;  Surgeon: Algernon Huxley, MD;  Location: [place] CV LAB;  Service: Cardiovascular;;     Social History:   reports that he has been smoking cigarettes. He has a 41.00 pack-year smoking history. He has never used smokeless tobacco. He reports that he does not drink alcohol and does not use drugs.   Family History:  His family history includes Heart attack in his father; Heart disease in his father; Hyperlipidemia in his father and mother; Hypertension in his father and mother.   Allergies Allergies  Allergen Reactions   Dulaglutide Anaphylaxis, Diarrhea and Hives  Ace Inhibitors     hypotension   Other Itching    Skin itching associated with nitro patch   Prednisone Rash     Home Medications  Prior to Admission medications   Medication Sig Start Date End Date Taking? Authorizing Provider  albuterol (VENTOLIN HFA) 108 (90 Base) MCG/ACT inhaler Inhale into the lungs every 6 (six) hours as needed for wheezing or shortness of breath.    [provider]  amoxicillin-clavulanate (AUGMENTIN) 875-125 MG tablet Take 1 tablet by mouth every 12 (twelve) hours. 03/21/21   Samuella Cota, MD  apixaban (ELIQUIS) 5 MG TABS tablet Take 5 mg by mouth 2 (two) times daily.    [provider]  atorvastatin (LIPITOR) 80 MG tablet Take 1 tablet (80 mg total) by mouth daily. 10/09/20   Minna Merritts, MD  carvedilol (COREG) 3.125 MG tablet TAKE 1 TABLET BY MOUTH TWICE DAILY WITH MEALS 03/16/21   Minna Merritts, MD  clopidogrel (PLAVIX) 75 MG tablet Take 1 tablet (75 mg total) by mouth daily. 10/09/20   Minna Merritts, MD  digoxin (LANOXIN) 0.125 MG  tablet Take 0.125 mg by mouth daily.    [provider]  DULoxetine (CYMBALTA) 60 MG capsule Take 60 mg by mouth daily.    [provider]  ezetimibe (ZETIA) 10 MG tablet Take 1 tablet (10 mg total) by mouth daily. 10/09/20   Minna Merritts, MD  Fluticasone-Umeclidin-Vilant 100-62.5-25 MCG/INH AEPB Inhale 1 puff into the lungs daily.    [provider]  gabapentin (NEURONTIN) 800 MG tablet Take 800 mg by mouth 4 (four) times daily.  08/14/17 04/04/29  [provider]  ipratropium-albuterol (DUONEB) 0.5-2.5 (3) MG/3ML SOLN Inhale 3 mLs into the lungs every 6 (six) hours as needed (wheezing/sob).     [provider]  isosorbide mononitrate (IMDUR) 30 MG 24 hr tablet Take 1 tablet (30 mg total) by mouth daily. 10/09/20   Minna Merritts, MD  losartan (COZAAR) 25 MG tablet Take 0.5 tablets (12.5 mg total) by mouth daily. 10/09/20   Minna Merritts, MD  metFORMIN (GLUCOPHAGE-XR) 500 MG 24 hr tablet Take 1,000 mg by mouth 2 (two) times daily. 01/02/21   [provider]  metolazone (ZAROXOLYN) 2.5 MG tablet Take 1 tablet (2.5 mg total) by mouth daily as needed. Take 1/2 hour before morning torsemide 01/23/21 04/23/21  Alisa Graff, FNP  Multiple Vitamin (MULTI-VITAMINS) TABS Take 1 tablet by mouth daily.     [provider]  nitroGLYCERIN (NITROSTAT) 0.4 MG SL tablet Place 1 tablet (0.4 mg total) under the tongue every 5 (five) minutes as needed for chest pain. 10/09/20   Minna Merritts, MD  omeprazole (PRILOSEC) 40 MG capsule Take 40 mg by mouth daily.    [provider]  Oxycodone HCl 10 MG TABS Take 10 mg by mouth every 4 (four) hours as needed (pain).    [provider]  polyethylene glycol (MIRALAX / GLYCOLAX) 17 g packet Take 17 g by mouth daily as needed for mild constipation. 08/12/20   Loletha Grayer, MD  potassium chloride (KLOR-CON) 10 MEQ tablet Take 1 tablet (10 mEq total) by mouth 2 (two) times daily. 10/09/20    Minna Merritts, MD  spironolactone (ALDACTONE) 25 MG tablet Take 0.5 tablets (12.5 mg total) by mouth daily. Patient taking differently: Take 25 mg by mouth daily. 10/09/20   Minna Merritts, MD  tamsulosin (FLOMAX) 0.4 MG CAPS capsule Take 1 capsule (0.4  mg total) by mouth daily after supper. 07/01/20   Shawna Clamp, MD  torsemide (DEMADEX) 20 MG tablet Take 40 mg by mouth daily.    [provider]  traZODone (DESYREL) 150 MG tablet Take 150 mg by mouth at bedtime. 07/12/19   [provider]  VERQUVO 10 MG TABS TAKE 1 TABLET BY MOUTH ONCE DAILY 11/16/20   Alisa Graff, FNP     Critical care time: 40 minutes       Darel Hong, AGACNP-BC [facility] Prefer epic messenger for cross cover needs If after hours, please call E-link

## 2021-03-26 NOTE — ED Notes (Signed)
Patient resting quietly in bed.  Respirations even and unlabored.  Call bell in reach.  Urinal placed at bedside

## 2021-03-26 NOTE — ED Notes (Signed)
Patient given diet sprite at this time.

## 2021-03-26 NOTE — Progress Notes (Signed)
PHARMACY CONSULT NOTE - FOLLOW UP  Pharmacy Consult for Electrolyte Monitoring and Replacement   Recent Labs: Potassium (mmol/L)  Date Value  03/25/2021 4.5  06/08/2014 4.3   Magnesium (mg/dL)  Date Value  03/21/2021 2.0  02/06/2014 1.4 (L)   Calcium (mg/dL)  Date Value  03/25/2021 9.0   Calcium, Total (mg/dL)  Date Value  06/08/2014 9.2   Albumin (g/dL)  Date Value  03/25/2021 3.9  01/29/2018 3.3 (L)  03/07/2014 3.9   Phosphorus (mg/dL)  Date Value  03/19/2021 4.2   Sodium (mmol/L)  Date Value  03/25/2021 131 (L)  01/25/2021 140  06/08/2014 136     Assessment: 10/17 @ 2005:  All electrolytes WNL   Goal of Therapy:  Electrolytes WNL   Plan:  No additional electrolyte supplementation needed at this time.  Will recheck electrolytes on 10/17 with AM labs.   Orene Desanctis ,PharmD Clinical Pharmacist 03/26/2021 12:07 AM

## 2021-03-26 NOTE — Progress Notes (Signed)
*  PRELIMINARY RESULTS* Echocardiogram 2D Echocardiogram has been performed.  [name] [name] 03/26/2021, 2:26 PM

## 2021-03-26 NOTE — Progress Notes (Signed)
PHARMACY -  BRIEF ANTIBIOTIC NOTE   Pharmacy has received consult(s) for Vancomycin from an ED provider.  The patient's profile has been reviewed for ht/wt/allergies/indication/available labs.    One time order(s) placed for Vancomycin 2 gm   Further antibiotics/pharmacy consults should be ordered by admitting physician if indicated.                       Thank you, Jayleen Scaglione D 03/26/2021  12:04 AM

## 2021-03-26 NOTE — Progress Notes (Addendum)
PHARMACY CONSULT NOTE - FOLLOW UP  Pharmacy Consult for Electrolyte Monitoring and Replacement   Recent Labs: Potassium (mmol/L)  Date Value  03/26/2021 5.2 (H)  06/08/2014 4.3   Magnesium (mg/dL)  Date Value  03/26/2021 1.9  02/06/2014 1.4 (L)   Calcium (mg/dL)  Date Value  03/26/2021 8.9   Calcium, Total (mg/dL)  Date Value  06/08/2014 9.2   Albumin (g/dL)  Date Value  03/25/2021 3.9  01/29/2018 3.3 (L)  03/07/2014 3.9   Phosphorus (mg/dL)  Date Value  03/26/2021 4.5   Sodium (mmol/L)  Date Value  03/26/2021 133 (L)  01/25/2021 140  06/08/2014 136     Assessment: Scr: (BL ~1.1-1.3) 1.4>1.3 K: 4.5>5.2 Na: 131>133 Mg/Phos: WNL   Goal of Therapy:  Electrolytes WNL   Plan:  No additional electrolyte supplementation needed at this time, but will recheck K+ at noon to assess trend --> 5.2>4.6 (noon). Scr has improved to baseline.  Pt has h/o Afib (currently in AF w/ RBBB; Qtc 429m) & K+ at ULN (all other lytes WNL).  Will continue to monitor with AM labs.   BLorna Dibble,PharmD,BCCP Clinical Pharmacist 03/26/2021 10:40 AM

## 2021-03-26 NOTE — Consult Note (Addendum)
   Heart Failure Nurse Navigator Note  HFrEF less than 20%.  Right ventricular systolic function is severely reduced.  Severely elevated pulmonary artery pressure.  Severe biatrial enlargement.  Tricuspid regurgitation is mild to moderate.  Echocardiogram is pending on this admission.  He presented to the emergency room with complaints of fatigue, left-sided headache, chest pressure and shortness of breath   Comorbidities:  Arthritis Asthma COPD Coronary artery disease Diabetes Hyperlipidemia Hypertension Morbid obesity Persistent atrial fibrillation Sleep apnea Tobacco abuse Chronic kidney disease stage III  Medications:  Midodrine 10 mg 3 times a day Eliquis 5 mg twice a day Atorvastatin 80 mg daily Plavix 75 mg daily Zetia 10 mg daily  At home he is on Coreg 1 tablet twice a day of 3.125 mg, digoxin 0.125 mg, losartan 25 mg half tablet daily, Zaroxolyn 2.5 mg as needed, spironolactone 12 and half milligrams daily and torsemide 40 mg daily.  At time of interview was on Levophed.  Initial meeting with patient, he is currently lying on a gurney in the emergency room in no acute distress  eating breakfast.   He states that he does not weigh himself on a daily basis, he states that he had in the past and just quit doing it.  Discussed the importance of weighing daily, recording and what to report to physician.  He states that he is fairly active at home.  He states with this hospitalization "he does not understand why it is not fair to him."   Discussed follow-up in the outpatient heart failure clinic with [facility].  He had just been seen by her on February 27, 2021 and felt to be euvolemic  on that time.  He has an appointment scheduled for October 26 at 1 PM.  Pricilla Riffle RN [facility]     He states that he does not use salt at the table and tries to follow a low-sodium diet.

## 2021-03-26 NOTE — Consult Note (Signed)
Cardiology Consultation:   Patient ID: [name] MRN: [id number]; DOB: [birth year]  Admit date: 03/25/2021 Date of Consult: 03/26/2021  PCP:  [name] Jacobs, MD   [place]  Cardiologist:  Ida Rogue, MD  Advanced Practice Provider:  No care team member to display Electrophysiologist:  None    Patient Profile:   [name] [name] is a 60 y.o. 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 Kurtis Bushman.  History of Present Illness:   [name] [name] is 60 year old male with PMH as above.  He has history of CAD s/p 2018 cath with severe multivessel CAD.  He was evaluated by CT surgery and felt not a suitable candidate.  He has history of LV dysfunction with EF less than 20% x 06/2020 echo.   He was last seen in clinic 03/09/2021 by Dr. Rockey Situ, at which time he reported taking torsemide 40 mg in the morning and 20 mg in the afternoon.  He was asymptomatic with his low BP.  Smoking cessation was recommended.  Echo was ordered but not yet obtained.  He was recently admitted in early October for cellulitis of the left lower extremity.  He presented to the ED with left leg erythema and left-sided chest pain.  He was also hypotensive with history of asx hypotension as above.  He was admitted to the ICU and received antibiotics and vasopressor support.  He reports that he felt well at the time of discharge.  Digoxin discontinued on review of  discharge.  Yesterday 10/16, he presented to the emergency department with with report of acute shortness of breath.  Notes this admission indicate shortness of breath started while mowing the yard.  He reports today that he presented with his girlfriend with notes indicating possible COVID exposure.  He states that due to his history of heart failure he was taken back to the emergency department before his girlfriend. Today, he reports gradual volume increase though also states he felt at his baseline after his recent admission for cellulitis above. He notes a cough that is unchanged /chronic with gray sputum.  He reports decreased appetite and early satiety. No LEE reported. He reports lower back swelling.  He notes bloating and that his hernia repair feels more hard than usual.  He reports chronic/unchanged mild central chest pain as in the past.  He has chronic dizziness, which he states he is now used to since it is ongoing.  He reports medication compliance.  He denies any recent changes in diet or activity level. At presentation, initial BP 142/118, HR 79 bpm, 99.1 F, SpO2 94% .  Labs showed sodium 131, potassium 4.5, creatinine 1.40, BUN 39, AST 34, ALT 23, BNP 1115.4, high-sensitivity troponin 51  50, lactic acid 1.4, WBC 7.4, hemoglobin 10.6, hematocrit 31.6.  Respiratory panel negative.  CTA with diffuse atherosclerotic calcifications of the major vessels of the abdomen noted with patent left iliac stent and fatty liver.  Changes of prior hernia repair in the midline anterior  noted.  CT head without acute abnormality and showing chronic atrophic changes.  Chest x-ray showed slight increase in vascular congestion.EKG showed atrial fibrillation with RBBB, poor R wave progression, prolonged QTC.  MD notes indicate orders for norepinephrine and IVF as well as abx.   Past Medical History:  Diagnosis Date   Acute on chronic heart failure ([place])    AKI (acute kidney injury) ([place]) 01/03/2018   Angina at  rest [facility]) 08/13/2017   Arthritis    Asthma    Atherosclerosis    BPH (benign prostatic hyperplasia)    Carotid arterial disease ([place])    Cellulitis of foot, left 09/27/2019   Charcot's joint of foot, right    Chronic combined systolic (congestive) and diastolic (congestive) heart failure ([place])    a. 02/2013 EF 50% by LV gram; b. 04/2017 Echo: EF 25-30%. diff HK. Gr2 DD; c. 06/2020 Echo: EF <20%, glob HK. RVS 53.30mHg. Sev BAE. Mild to mod TR.   Chronic foot pain (Secondary Area of Pain) (Right) 01/29/2018   COPD (chronic obstructive pulmonary disease) (HCC)    COPD with acute exacerbation ([place] 01/30/2020   Coronary artery disease    a. 2013 S/P PCI of LAD ([facility];  b. 03/2013 PCI: RCA 90p ( 2.5 x 23 mm DES); c. 02/2014 Cath: patent RCA stent->Med Rx; d. 04/2017 Cath: LM nl, LAd 30ost/p, 677m, D1 80ost, LCX 90p/m, OM1 85, RCA 70/20p, 8062m0d->Referred for CT Surg-felt to be poor candidate; d. 01/2020 Cath: LM nl, LAD 20p/m, 60d. D1 80. LCX 90p/m. LPAV 95, RCA 80/20p, 80p/m.   Diabetic neuropathy ([place]  Diabetic ulcer of right foot ([place]  Hernia    HFrEF (heart failure with reduced ejection fraction) ([place]CC[place]8/22/2021   Hyperlipidemia    Hypertension    Ischemic cardiomyopathy    a. 02/2013 EF 50% by LV gram; b. 04/2017 Echo: EF 25-30%. diff HK. Gr2 DD; c. 06/2020 Echo: EF <20%, glob HK. RVS 53.2mm41m Sev BAE. Mild to mod TR.   Morbid obesity (HCC)[place] Neuropathy    PAD (peripheral artery disease) (HCC)[place] a. Followed by Dr. Dew;[place] 08/2010 Periph Angio: RSFA 70-80p (6X60 self-expanding stent); c. 09/2010 Periph Angio: L SFA 100p (7x unknown length self-expanding stent); d. 06/2015 Periph Angio: R SFA short segment occlusion (6x12 self-expanding stent).   Persistent atrial fibrillation (HCC)    Restless leg syndrome    Sepsis (HCC)[place]/21/2019   Sleep apnea    Subclavian artery stenosis, right (HCC)    Syncope and collapse    Tobacco use    a. 75+ yr hx - still smoking 1ppd, down from 2  ppd.   Type II diabetes mellitus (HCC)    Unstable angina (HCC)[place]/06/2016   Varicose vein     Past Surgical History:  Procedure Laterality Date   ABDOMINAL AORTAGRAM N/A 06/23/2013   Procedure: ABDOMINAL AORTMaxcine Hamurgeon: MuhaWellington Hampshire;  [facility];  Service: Cardiovascular;  Laterality: N/A;   AMPUTATION Right 04/01/2018   Procedure: AMPUTATION BELOW KNEE;  Surgeon: Dew,Algernon Huxley;  Location: [facility] ORS;  Service: Vascular;  Laterality: Right;   AMPUTATION TOE Left 08/11/2020   Procedure: AMPUTATION TOE-2nd Toe;  Surgeon: FowlSamara DeistM;  Location: [facility] OR[facility];  Service: Podiatry;  Laterality: Left;   APPENDECTOMY     C[facility]  [facility]   [facility]  CARDIAC [facility]  [facility]21/2014   [facility]   CLAVICLE SURGERY     HERNIA REPAIR     IRRIGATION AND DEBRIDEMENT FOOT Right 01/04/2018   Procedure: IRRIGATION AND DEBRIDEMENT FOOT;  Surgeon: Samara Deist, DPM;  Location: [facility] ORS;  Service: Podiatry;  Laterality: Right;   LEFT HEART CATH AND CORONARY ANGIOGRAPHY N/A 01/20/2020   Procedure: LEFT HEART CATH AND CORONARY ANGIOGRAPHY;  Surgeon: Wellington Hampshire, MD;  Location: [place] CV LAB;  Service: Cardiovascular;  Laterality: N/A;   LEFT HEART CATH AND CORS/GRAFTS ANGIOGRAPHY N/A 04/28/2017   Procedure: LEFT HEART CATH AND CORONARY ANGIOGRAPHY;  Surgeon: Wellington Hampshire, MD;  Location: [place] CV LAB;  Service: Cardiovascular;  Laterality: N/A;   LOWER EXTREMITY ANGIOGRAPHY Right 03/03/2018   Procedure: LOWER EXTREMITY ANGIOGRAPHY;  Surgeon: Katha Cabal, MD;  Location: [place] CV LAB;  Service: Cardiovascular;  Laterality: Right;   LOWER EXTREMITY ANGIOGRAPHY Left 08/24/2018   Procedure: LOWER EXTREMITY ANGIOGRAPHY;  Surgeon: Algernon Huxley, MD;  Location: [place] CV LAB;  Service: Cardiovascular;  Laterality: Left;   LOWER EXTREMITY ANGIOGRAPHY Left 06/14/2019    Procedure: LOWER EXTREMITY ANGIOGRAPHY;  Surgeon: Algernon Huxley, MD;  Location: [place] CV LAB;  Service: Cardiovascular;  Laterality: Left;   LOWER EXTREMITY ANGIOGRAPHY Left 08/02/2019   Procedure: LOWER EXTREMITY ANGIOGRAPHY;  Surgeon: Algernon Huxley, MD;  Location: [place] CV LAB;  Service: Cardiovascular;  Laterality: Left;   LOWER EXTREMITY ANGIOGRAPHY Left 07/17/2020   Procedure: LOWER EXTREMITY ANGIOGRAPHY;  Surgeon: Algernon Huxley, MD;  Location: [place] CV LAB;  Service: Cardiovascular;  Laterality: Left;   PERIPHERAL ARTERIAL STENT GRAFT     x2 left/right   PERIPHERAL VASCULAR BALLOON ANGIOPLASTY Right 01/06/2018   Procedure: PERIPHERAL VASCULAR BALLOON ANGIOPLASTY;  Surgeon: Katha Cabal, MD;  Location: [place] CV LAB;  Service: Cardiovascular;  Laterality: Right;   PERIPHERAL VASCULAR CATHETERIZATION N/A 07/06/2015   Procedure: Abdominal Aortogram w/Lower Extremity;  Surgeon: Algernon Huxley, MD;  Location: [place] CV LAB;  Service: Cardiovascular;  Laterality: N/A;   PERIPHERAL VASCULAR CATHETERIZATION  07/06/2015   Procedure: Lower Extremity Intervention;  Surgeon: Algernon Huxley, MD;  Location: [place] CV LAB;  Service: Cardiovascular;;     Home Medications:  Prior to Admission medications   Medication Sig Start Date End Date Taking? Authorizing Provider  albuterol (VENTOLIN HFA) 108 (90 Base) MCG/ACT inhaler Inhale into the lungs every 6 (six) hours as needed for wheezing or shortness of breath.   Yes [provider]  amoxicillin-clavulanate (AUGMENTIN) 875-125 MG tablet Take 1 tablet by mouth every 12 (twelve) hours. 03/21/21  Yes Samuella Cota, MD  apixaban (ELIQUIS) 5 MG TABS tablet Take 5 mg by mouth 2 (two) times daily.   Yes [provider]  atorvastatin (LIPITOR) 80 MG tablet Take 1 tablet (80 mg total) by mouth daily. 10/09/20  Yes Gollan, Kathlene November, MD  carvedilol (COREG) 3.125 MG tablet TAKE 1 TABLET BY MOUTH TWICE DAILY  WITH MEALS 03/16/21  Yes Gollan, Kathlene November, MD  clopidogrel (PLAVIX) 75 MG tablet Take 1 tablet (75 mg total) by mouth daily. 10/09/20  Yes Minna Merritts, MD  digoxin (LANOXIN) 0.125 MG tablet Take 0.125 mg by mouth daily.   Yes [provider]  DULoxetine (CYMBALTA) 60 MG capsule Take 60 mg by mouth daily.   Yes [provider]  ezetimibe (ZETIA) 10 MG tablet Take 1 tablet (10 mg total) by mouth daily. 10/09/20  Yes Minna Merritts, MD  Fluticasone-Umeclidin-Vilant 100-62.5-25 MCG/INH AEPB Inhale 1 puff into the lungs daily.   Yes [provider]  gabapentin (NEURONTIN) 800 MG tablet Take 800 mg by mouth 4 (four) times daily.  08/14/17 04/04/29 Yes [provider]  ipratropium-albuterol (DUONEB) 0.5-2.5 (3) MG/3ML SOLN Inhale 3 mLs into the lungs every 6 (six) hours as needed (wheezing/sob).    Yes [provider]  isosorbide mononitrate (IMDUR) 30 MG 24 hr tablet Take 1 tablet (30 mg total) by mouth daily. 10/09/20  Yes Minna Merritts, MD  losartan (COZAAR) 25 MG tablet Take 0.5 tablets (12.5 mg total) by mouth daily. 10/09/20  Yes Minna Merritts, MD  metFORMIN (GLUCOPHAGE-XR) 500 MG 24 hr tablet Take 1,000 mg by mouth 2 (two) times daily. 01/02/21  Yes [provider]  metolazone (ZAROXOLYN) 2.5 MG tablet Take 1 tablet (2.5 mg total) by mouth daily as needed. Take 1/2 hour before morning torsemide 01/23/21 04/23/21 Yes Hackney, Tina A, FNP  Multiple Vitamin (MULTI-VITAMINS) TABS Take 1 tablet by mouth daily.    Yes [provider]  nitroGLYCERIN (NITROSTAT) 0.4 MG SL tablet Place 1 tablet (0.4 mg total) under the tongue every 5 (five) minutes as needed for chest pain. 10/09/20  Yes Minna Merritts, MD  omeprazole (PRILOSEC) 40 MG capsule Take 40 mg by mouth daily.   Yes [provider]  Oxycodone HCl 10 MG TABS Take 10 mg by mouth every 4 (four) hours as needed (pain).   Yes [provider]  polyethylene glycol  (MIRALAX / GLYCOLAX) 17 g packet Take 17 g by mouth daily as needed for mild constipation. 08/12/20  Yes Wieting, Richard, MD  potassium chloride (KLOR-CON) 10 MEQ tablet Take 1 tablet (10 mEq total) by mouth 2 (two) times daily. 10/09/20  Yes Minna Merritts, MD  spironolactone (ALDACTONE) 25 MG tablet Take 0.5 tablets (12.5 mg total) by mouth daily. 10/09/20  Yes Minna Merritts, MD  tamsulosin (FLOMAX) 0.4 MG CAPS capsule Take 1 capsule (0.4 mg total) by mouth daily after supper. 07/01/20  Yes Shawna Clamp, MD  torsemide (DEMADEX) 20 MG tablet Take 40 mg by mouth daily.   Yes [provider]  traZODone (DESYREL) 150 MG tablet Take 150 mg by mouth at bedtime. 07/12/19  Yes [provider]  VERQUVO 10 MG TABS TAKE 1 TABLET BY MOUTH ONCE DAILY 11/16/20  Yes Alisa Graff, FNP    Inpatient Medications: Scheduled Meds:  apixaban  5 mg Oral BID   atorvastatin  80 mg Oral Daily   clopidogrel  75 mg Oral Daily   DULoxetine  60 mg Oral Daily   ezetimibe  10 mg Oral Daily   fluticasone furoate-vilanterol  1 puff Inhalation Daily   And   umeclidinium bromide  1 puff Inhalation Daily   gabapentin  800 mg Oral QID   insulin aspart  0-15 Units Subcutaneous TID AC & HS   ipratropium-albuterol  3 mL Nebulization Q6H   midodrine  10 mg Oral TID WC   nicotine  21 mg Transdermal Daily   pantoprazole (PROTONIX) IV  40 mg Intravenous QHS   tamsulosin  0.4 mg Oral QPC supper   traZODone  150 mg Oral QHS   Continuous Infusions:  sodium chloride 250 mL (03/26/21 0037)   norepinephrine (LEVOPHED) Adult infusion 3 mcg/min (03/26/21 0037)   PRN Meds: acetaminophen, docusate sodium, ondansetron (ZOFRAN) IV, polyethylene glycol  Allergies:    Allergies  Allergen Reactions  Dulaglutide Anaphylaxis, Diarrhea and Hives   Ace Inhibitors     hypotension   Other Itching    Skin itching associated with nitro patch   Prednisone Rash    Social History:   Social History   Socioeconomic  History   Marital status: Single    Spouse name: Not on file   Number of children: 0   Years of education: 12   Highest education level: 12th grade  Occupational History   Occupation: on disability  Tobacco Use   Smoking status: Every Day    Packs/day: 1.00    Years: 41.00    Pack years: 41.00    Types: Cigarettes   Smokeless tobacco: Never  Vaping Use   Vaping Use: Never used  Substance and Sexual Activity   Alcohol use: No   Drug use: No   Sexual activity: Yes  Other Topics Concern   Not on file  Social History Narrative   Lives at home in [place] with girlfriend. Independent at baseline.   Social Determinants of Health   Financial Resource Strain: Not on file  Food Insecurity: Not on file  Transportation Needs: Not on file  Physical Activity: Not on file  Stress: Not on file  Social Connections: Not on file  Intimate Partner Violence: Not on file    Family History:    Family History  Problem Relation Age of Onset   Heart attack Father    Heart disease Father    Hypertension Father    Hyperlipidemia Father    Hypertension Mother    Hyperlipidemia Mother      ROS:  Please see the history of present illness.  Review of Systems  Respiratory:  Positive for cough and shortness of breath.   Cardiovascular:  Positive for chest pain and leg swelling.  Gastrointestinal:  Positive for abdominal pain.   All other ROS reviewed and negative.     Physical Exam/Data:   Vitals:   03/26/21 1030 03/26/21 1144 03/26/21 1145 03/26/21 1206  BP: 112/76  97/68 92/66  Pulse: 81  77 79  Resp: '12  16 16  '$ Temp:      TempSrc:      SpO2: 99% 91%  95%  Weight:      Height:        Intake/Output Summary (Last 24 hours) at 03/26/2021 1214 Last data filed at 03/26/2021 0800 Gross per 24 hour  Intake 750 ml  Output 1900 ml  Net -1150 ml   Last 3 Weights 03/25/2021 03/19/2021 03/18/2021  Weight (lbs) 180 lb 192 lb 7.4 oz 185 lb 13.6 oz  Weight (kg) 81.647 kg 87.3 kg 84.3  kg  Some encounter information is confidential and restricted. Go to Review Flowsheets activity to see all data.     Body mass index is 28.19 kg/m.  General:  Well nourished, well developed, in no acute distress HEENT: normal Lymph: no adenopathy Neck: JVD not well visualized Endocrine:  No thryomegaly Vascular: No carotid bruits; FA pulses 2+ bilaterally without bruits  Cardiac:  normal S1, S2; IRIR; 1/6 systolic murmur Lungs: Scattered rhonchi, expiratory wheeze Abd: mildly firm, obese, mild flank/lower back edema, hard area reported as hernia repair and per pt more hard than usual Ext: no edema Musculoskeletal: Right-sided BKA, left lower extremity erythema with mild lower extremity edema Skin: warm and dry Neuro:  CNs 2-12 intact, no focal abnormalities noted Psych:  Normal affect   EKG:  The EKG was personally reviewed and demonstrates:  EKG  showed atrial fibrillation with RBBB, poor R wave progression throughout all leads, prolonged QTC. Telemetry:  Telemetry was personally reviewed and demonstrates:  Afib with RBBB and PVCs, ventricular rates 70s to 80s  Relevant CV Studies: Echo 06/17/2020  1. Left ventricular ejection fraction, by estimation, is <20%. Left  ventricular ejection fraction by PLAX is 14 %. The left ventricle has  severely decreased function. The left ventricle demonstrates global  hypokinesis. The left ventricular internal  cavity size was severely dilated. Left ventricular diastolic parameters  are indeterminate.   2. Right ventricular systolic function is severely reduced. The right  ventricular size is moderately enlarged. There is severely elevated  pulmonary artery systolic pressure. The estimated right ventricular  systolic pressure is [id number] mmHg.   3. Left atrial size was severely dilated.   4. Right atrial size was severely dilated.   5. Tricuspid valve regurgitation is mild to moderate.   6. The inferior vena cava is dilated in size with <50%  respiratory  variability, suggesting right atrial pressure of 15 mmHg.    Cardiac catheterization 01/12/2020 Conclusion Prox RCA-1 lesion is 80% stenosed. Prox RCA-2 lesion is 20% stenosed. Prox RCA to Mid RCA lesion is 80% stenosed. Prox LAD lesion is 20% stenosed. 1st Diag lesion is 80% stenosed. Mid LAD lesion is 20% stenosed. Dist LAD lesion is 60% stenosed. Prox Cx to Mid Cx lesion is 90% stenosed. LPAV lesion is 95% stenosed.   1. Significant underlying three-vessel coronary artery disease. Patent proximal LAD stent with mild in-stent restenosis. Moderate diffuse disease in the distal LAD. Significant stenosis in the proximal left circumflex at the origin of the posterior AV groove artery which is also heavily diseased at the ostium. This is a bifurcation lesion and heavily calcified. RCA stent is patent. However, there is significant proximal disease in the whole mid to distal segment is diffusely diseased and small caliber. 2. Left ventricular angiography was not performed. EF was severe reduced by echo. 3. Moderately elevated left ventricular end-diastolic pressure at 28 mmHg   Recommendations: The patient's cardiomyopathy seems to be out of proportion to his coronary artery disease as the LAD itself does not seem to have obstructive disease at the present time. The RCA is diffusely diseased and too small to stent. The only target for revascularization would be the left circumflex but it is a calcified bifurcation lesion and likely requires atherectomy. In order to do this safely, the patient has to be optimized from a heart failure standpoint as he appears to be volume overloaded. Recommend intravenous diuresis. If limited by hypotension, we might need to consider inotropic support and small dose norepinephrine drip initially. I added small dose digoxin. Treat underlying atrial fibrillation. Resume heparin drip 8 hours after sheath pull.     Coronary Diagrams      Diagnostic Dominance: Co-dominant     LE arterial study - 09/08/19 Summary:  Left: Patent Left LE arterial system from EIA to distal PTA/ATA. New SFA  to TP trunk stent is patent with no evidence of stenosis. Spot check of  lower extremity venous system shows no evidence of DVT. Lymph nodes in  groin show multiple enlarged nodes.    ABIs- 08/11/19 Summary:  Left: Resting left ankle-brachial index indicates mild left lower  extremity arterial disease. The left toe-brachial index is abnormal.    08/2017 [facility] 1. The left ventricle is moderately dilated in cavity size with normal wall thickness. Global systolic function is severely reduced with an LV  ejection fraction calculated at 19%. There is severe hypokinesis globally. 2. The right ventricle is moderately dilated in cavity size with normal wall thickness. Global RV systolic function is severely reduced with an RVEF calculated at 20%.  3. Both atria are moderately enlarged in size. 4. The aortic valve is trileaflet in morphology. There is no significant aortic valve stenosis or regurgitation.  5. There is trivial mitral regurgitation. There is mild-moderate tricuspid regurgitation. 6. Delayed enhancement imaging is mildly abnormal. There is a small thin layer of subendocardial infarction in the basal lateral wall. There is significant viability in all coronary territories. 7. Adenosine stress perfusion imaging is abnormal with stress perfusion defects indicating inducible myocardial ischemia in: a) the entire inferior and inferoseptal walls, consistent with the RCA perfusion territory. b) the basal-to-mid lateral wall, suggestive of peri-infarct ischemia in the LCx perfusion territory. c) the distal septal wall, consistent with the distal LAD territory. 8. No intra-cardiac thrombus visualized. 9. There is a small circumferential pericardial effusion. There are small bilateral pleural  effusions.  Laboratory Data:  High Sensitivity Troponin:   Recent Labs  Lab 03/18/21 1110 03/18/21 1317 03/25/21 2005 03/26/21 0015  TROPONINIHS 73* 68* 51* 50*     Chemistry Recent Labs  Lab 03/21/21 0556 03/25/21 2005 03/26/21 0517  NA 128* 131* 133*  K 4.2 4.5 5.2*  CL 92* 90* 93*  CO2 '30 30 29  '$ GLUCOSE 106* 113* 126*  BUN 30* 39* 35*  CREATININE 1.18 1.40* 1.30*  CALCIUM 8.5* 9.0 8.9  GFRNONAA >60 58* >60  ANIONGAP '6 11 11    '$ Recent Labs  Lab 03/25/21 2005  PROT 6.9  ALBUMIN 3.9  AST 34  ALT 23  ALKPHOS 91  BILITOT 1.5*   Hematology Recent Labs  Lab 03/20/21 0642 03/25/21 2005 03/26/21 0517  WBC 6.4 7.4 11.1*  RBC 4.20* 3.87* 4.21*  HGB 11.4* 10.6* 11.6*  HCT 33.1* 31.6* 34.9*  MCV 78.8* 81.7 82.9  MCH 27.1 27.4 27.6  MCHC 34.4 33.5 33.2  RDW 21.3* 21.7* 21.5*  PLT 134* 154 186   BNP Recent Labs  Lab 03/25/21 2005  BNP 1,115.4*    DDimer No results for input(s): DDIMER in the last 168 hours.   Radiology/Studies:  DG Chest 2 View  Result Date: 03/25/2021 CLINICAL DATA:  Shortness of breath, possible COVID exposure EXAM: CHEST - 2 VIEW COMPARISON:  03/18/2021 FINDINGS: Cardiac shadow is enlarged but stable. Aortic calcifications are again seen and stable. The lungs are well aerated bilaterally. Mild increase in central vascular congestion is noted without interstitial edema. No focal infiltrate or effusion is noted. No acute bony abnormality is seen. IMPRESSION: Slight increase in vascular congestion when compared with the prior exam. No other focal abnormality is noted. Electronically Signed   By: Inez Catalina M.D.   On: 03/25/2021 20:51   CT HEAD WO CONTRAST (5MM)  Result Date: 03/25/2021 CLINICAL DATA:  Headaches and history of possible COVID exposure EXAM: CT HEAD WITHOUT CONTRAST TECHNIQUE: Contiguous axial images were obtained from the base of the skull through the vertex without intravenous contrast. COMPARISON:  04/09/2015 FINDINGS:  Brain: No evidence of acute infarction, hemorrhage, hydrocephalus, extra-axial collection or mass lesion/mass effect. Mild atrophic changes are noted. Vascular: No hyperdense vessel or unexpected calcification. Skull: Normal. Negative for fracture or focal lesion. Sinuses/Orbits: No acute finding. Other: None. IMPRESSION: Chronic atrophic changes without acute abnormality. Electronically Signed   By: Inez Catalina M.D.   On: 03/25/2021 22:36   CT Angio  Chest/Abd/Pel for Dissection W and/or Wo Contrast  Result Date: 03/25/2021 CLINICAL DATA:  Chest and abdominal pain, possible COVID exposure EXAM: CT ANGIOGRAPHY CHEST, ABDOMEN AND PELVIS TECHNIQUE: Non-contrast CT of the chest was initially obtained. Multidetector CT imaging through the chest, abdomen and pelvis was performed using the standard protocol during bolus administration of intravenous contrast. Multiplanar reconstructed images and MIPs were obtained and reviewed to evaluate the vascular anatomy. CONTRAST:  168m OMNIPAQUE IOHEXOL 350 MG/ML SOLN COMPARISON:  08/22/2020 FINDINGS: CTA CHEST FINDINGS Cardiovascular: Initial precontrast images demonstrates significant atherosclerotic calcifications of the thoracic aorta. Post-contrast images demonstrate no aneurysmal dilatation or dissection. Coronary calcifications are seen. The heart is mildly enlarged in size. The pulmonary artery as visualized shows no central pulmonary embolus. Mediastinum/Nodes: Thoracic inlet is within normal limits. No sizable hilar or mediastinal adenopathy is noted. A few small right hilar lymph nodes are seen and likely reactive in nature. The esophagus as visualized is within normal limits. Lungs/Pleura: Lungs are well aerated bilaterally. No focal infiltrate or sizable effusion is seen. No parenchymal nodules are noted. Mild scarring in the left base is noted. Musculoskeletal: Mild degenerative changes of the thoracic spine are seen. No rib fractures are noted. No compression  deformities are seen. Review of the MIP images confirms the above findings. CTA ABDOMEN AND PELVIS FINDINGS VASCULAR Aorta: Abdominal aorta demonstrates atherosclerotic calcifications without aneurysmal dilatation or dissection. Celiac: Atherosclerotic calcifications are noted without focal significant stenosis. SMA: Diffuse atherosclerotic calcifications are noted with areas of mild narrowing throughout the proximal superior mesenteric artery. Renals: Atherosclerotic calcifications are noted involving the renal arteries bilaterally. Mild stenoses are seen. Dual renal arteries are noted on the left. IMA: Patent without evidence of aneurysm, dissection, vasculitis or significant stenosis. Inflow: Iliacs demonstrate scattered atherosclerotic calcification as well as evidence of prior stenting on the left. No aneurysmal dilatation is seen. Multifocal areas of moderate narrowing are seen. Veins: No specific venous abnormality is noted. Review of the MIP images confirms the above findings. NON-VASCULAR Hepatobiliary: Fatty infiltration of the liver is seen. The gallbladder is within normal limits. Pancreas: Unremarkable. No pancreatic ductal dilatation or surrounding inflammatory changes. Spleen: Normal in size without focal abnormality. Adrenals/Urinary Tract: Adrenal glands are within normal limits. Kidneys demonstrate a normal enhancement pattern bilaterally. No obstructive changes are seen. No renal calculi are noted. The bladder is well distended. Stomach/Bowel: Colon shows no obstructive or inflammatory changes. Appendix is not well visualized consistent with a prior surgical history. Small bowel and stomach appear within normal limits. Lymphatic: No significant lymphadenopathy is noted. Reproductive: Prostate is unremarkable. Other: No free fluid is noted. Changes of prior hernia repair are noted in the anterior abdominal wall. Some diastasis of the rectus muscles is seen. Musculoskeletal: No acute or significant  osseous findings. Review of the MIP images confirms the above findings. IMPRESSION: CTA of the chest: No evidence of thoracic aortic aneurysm or dissection. No focal infiltrate or sizable effusion is seen. CTA of the abdomen and pelvis: Diffuse atherosclerotic calcifications involving the major vessels without aneurysm or dissection. Patent left iliac stent. Fatty liver. Changes of prior hernia repair in the midline anteriorly. Electronically Signed   By: MInez CatalinaM.D.   On: 03/25/2021 22:43     Assessment and Plan:   Ischemic cardiomyopathy Chronic systolic heart failure Pulmonary hypertension --Reports shortness of breath and multiple sx that he also notes as chronic.  Initially presented due to possible COVID-19 exposure with his girlfriend. Breathing status is likely at least in part  due to COPD with ongoing tobacco use.  Feels he may be holding on to fluid and that symptoms have gradually progressed though also notes he felt back to baseline after his recent admission for left lower extremity cellulitis.  He has known history of heart failure/biventricular failure with 06/2020 EF less than 20%.  BNP 1115.4 at presentation, consistent with volume overload. Wt at presentation down from previous admissions and clinic weights with consideration of his reduced appetite/oral intake.  Suspect that LLE /erythema likely 2/2 ongoing cellulitis. In the setting of low output HF, he has history of hypotension that is reported as asx in the past.  Initially presented to the ED with elevated BP but started on norepinephrine and midodrine shortly thereafter in the setting of lower BP on subsequent vital checks.   Discontinue IV fluids in the setting of low EF  Recommend IV diuresis as tolerated Daily BMET.  Baseline creatinine 1.2-1.3. Monitor I's/O's, daily standing weights CHF education.   Of note, PTA diuresis includes torsemide 40 mg daily and metolazone 2.5 mg daily. Wean Levophed / midodrine as  tolerated Lower BP in the setting of low output heart failure and usually asx. Continue nasal cannula oxygen Restart GDMT as tolerated by Cr and BP. Currently holding medications for reduced BP at presentation as above.  PTA medications include Coreg 3.125 mg twice daily, for Verquvo 10 mg daily, Imdur 30 mg daily, losartan 12.5 mg daily.  We will continue to hold for now. Not a candidate for invasive advanced heart failure therapies in the setting of medical noncompliance  Elevated high-sensitivity troponin History of CAD --Reports chronic chest pain, unchanged from his usual chronic chest pain.  01/2020 LHC as above and declined CABG in the past.  High-sensitivity troponin minimally elevated and flat trending 51, 50.  EKG without acute ST/T changes.  Not consistent with ACS.  Continue Eliquis in place of ASA.  Continue Plavix.  No plans for ischemic evaluation.  Leukocytosis, Fever --Work-up per primary team.  WBC 11.1 with recurrent cellulitis.  Also reports COVID exposure and abdominal pain, as well as that his hernia repair feels hardened.  Presented with mild fever. Continue abx.  Permanent atrial fibrillation --Denies tachypalpitations.  Reports chronic dizziness and shortness of breath.  He does have a history of noncompliance with Eliquis.  CTA without evidence of PE.   Rates relatively well controlled with current beta-blocker held due to low BP at presentation. Restart BB once able. Restart digoxin with close monitoring of renal function. Check digoxin level. Continue Eliquis with CHA2DS2-VASc score of at least 4 (CHF, hypertension, DM2, vascular disease). Daily CBC.  CKD -- Baseline creatinine 1.2-1.3.  Daily BMET.  Hyperkalemia --Daily BMET.  Hold PTA potassium supplementation.  History of Hypertension, Current Hypotension --Initial BP elevated with subsequent lower pressures/hypotension now on pressors.  On review of previous clinic notes, he has history of lower BP and  asymptomatic.  Wean Levophed/midodrine as tolerated.  Hyperlipidemia - Continue Lipitor 80 mg and Zetia 10 mg  DM2 SSI, per IM  Left lower extremity cellulitis PAD s/p right BKA diabetic ulcer --Recurrent left lower extremity cellulitis s/p right BKA for DM2 ulcer and MRSA infection of right lower extremity.  Continue antibiotics.  Anemia --Daily CBC  COPD Tobacco use --Smoking cessation recommended  For questions or updates, please contact [place] Please consult [email or web address] for contact info under    Signed, Arvil Chaco, PA-C  03/26/2021 12:14 PM

## 2021-03-26 NOTE — ED Notes (Signed)
Informed RN bed assigned 

## 2021-03-27 ENCOUNTER — Encounter: Payer: Self-pay | Admitting: Pulmonary Disease

## 2021-03-27 DIAGNOSIS — R570 Cardiogenic shock: Secondary | ICD-10-CM

## 2021-03-27 DIAGNOSIS — I5023 Acute on chronic systolic (congestive) heart failure: Secondary | ICD-10-CM | POA: Diagnosis not present

## 2021-03-27 DIAGNOSIS — I4821 Permanent atrial fibrillation: Secondary | ICD-10-CM

## 2021-03-27 LAB — PROCALCITONIN: Procalcitonin: <0.1 ng/mL

## 2021-03-27 LAB — PHOSPHORUS: Phosphorus: 3.6 mg/dL (ref 2.5–4.6)

## 2021-03-27 LAB — CBC
HCT: 31.7 % — ABNORMAL LOW (ref 39.0–52.0)
Hemoglobin: 10.9 g/dL — ABNORMAL LOW (ref 13.0–17.0)
MCH: 27.9 pg (ref 26.0–34.0)
MCHC: 34.4 g/dL (ref 30.0–36.0)
MCV: 81.1 fL (ref 80.0–100.0)
Platelets: 120 10*3/uL — ABNORMAL LOW (ref 150–400)
RBC: 3.91 MIL/uL — ABNORMAL LOW (ref 4.22–5.81)
RDW: 21.4 % — ABNORMAL HIGH (ref 11.5–15.5)
WBC: 6.9 10*3/uL (ref 4.0–10.5)
nRBC: 0 % (ref 0.0–0.2)

## 2021-03-27 LAB — GLUCOSE, CAPILLARY
Glucose-Capillary: 185 mg/dL — ABNORMAL HIGH (ref 70–99)
Glucose-Capillary: 111 mg/dL — ABNORMAL HIGH (ref 70–99)
Glucose-Capillary: 131 mg/dL — ABNORMAL HIGH (ref 70–99)
Glucose-Capillary: 114 mg/dL — ABNORMAL HIGH (ref 70–99)
Glucose-Capillary: 147 mg/dL — ABNORMAL HIGH (ref 70–99)
Glucose-Capillary: 188 mg/dL — ABNORMAL HIGH (ref 70–99)
Glucose-Capillary: 127 mg/dL — ABNORMAL HIGH (ref 70–99)

## 2021-03-27 LAB — BASIC METABOLIC PANEL
Anion gap: 10 (ref 5–15)
BUN: 27 mg/dL — ABNORMAL HIGH (ref 6–20)
CO2: 32 mmol/L (ref 22–32)
Calcium: 9 mg/dL (ref 8.9–10.3)
Chloride: 94 mmol/L — ABNORMAL LOW (ref 98–111)
Creatinine, Ser: 0.95 mg/dL (ref 0.61–1.24)
GFR, Estimated: >60 mL/min (ref >60)
Glucose, Bld: 91 mg/dL (ref 70–99)
Potassium: 3.9 mmol/L (ref 3.5–5.1)
Sodium: 136 mmol/L (ref 135–145)

## 2021-03-27 LAB — MAGNESIUM: Magnesium: 1.9 mg/dL (ref 1.7–2.4)

## 2021-03-27 MED ORDER — POTASSIUM CHLORIDE CRYS ER 20 MEQ PO TBCR
40.0000 meq | EXTENDED_RELEASE_TABLET | Freq: Once | ORAL | Status: AC
  Administered 2021-03-27: 40 meq via ORAL
  Filled 2021-03-27: qty 2

## 2021-03-27 MED ORDER — MIDODRINE HCL 5 MG PO TABS: 5.0000 mg | ORAL_TABLET | Freq: Three times a day (TID) | ORAL | Status: DC

## 2021-03-27 MED ORDER — OXYCODONE-ACETAMINOPHEN 5-325 MG PO TABS
5.0000 | ORAL_TABLET | Freq: Four times a day (QID) | ORAL | Status: DC | PRN
  Administered 2021-03-27: 2 via ORAL
  Filled 2021-03-27 (×2): qty 10

## 2021-03-27 NOTE — Progress Notes (Signed)
Report given to Lutricia Feil, RN and pt care transferred. Patient transported on module via bed to 232. All VSS. CCMD notified. 2L [place] in place. Pain medication administered per order prior to transport. No concerns raised by receiving nurse.

## 2021-03-27 NOTE — Progress Notes (Addendum)
NAME:  [name] [name], MRN:  [id number], DOB:  June 12, 1960, LOS: 1 ADMISSION DATE:  03/25/2021, CONSULTATION DATE: 03/25/2021 REFERRING MD: Dr. Ellender Hose, CHIEF COMPLAINT: Chest pain and shortness of breath  History of Present Illness:  60 year old male presenting to [facility] ED from home after developed acute onset of general fatigue along with mild left-sided headache/chest pressure and shortness of breath while mowing his lawn earlier this afternoon. ED course: Patient also mildly hypotensive with complaints of mild shortness of breath  & chest pressure. EKG nonischemic, troponin at baseline, no leukocytosis & lactic normal.  He also presents with persistent erythema of his left lower extremity and of note had a recent admission for suspected cellulitis. medications given: 250 mL NS bolus, ceftriaxone and vancomycin IV.  Peripheral Levophed initiated for persistent hypotension Initial Vitals: T 37.3, RR 20, HR 79, BP initially stable at 142/118 > however patient quickly became hypotensive at 88/60 (66) & SPO2 94% on room air Significant labs: (Labs/ Imaging personally reviewed) I, Domingo Pulse [place], AGACNP-BC, personally viewed and interpreted this ECG. EKG Interpretation: Date: 03/25/2021, EKG Time: 19:54, Rate: 82, Rhythm: Atrial fibrillation, QRS Axis: LAD, Intervals: RBBB, ST/T Wave abnormalities: T wave inversion in anterolateral leads-unchanged from previous,  Narrative Interpretation: Atrial fibrillation with right bundle branch block Chemistry: Na+: 131, K+: 4.5, BUN/Cr.:  39/1.40, Serum CO2/ AG: 30/11 Hematology: WBC: 7.4, Hgb: 10.6,  Troponin: 51> 50, BNP: 1115.4, Lactic/ PCT: 1.4> 1.1,  COVID-19 & Influenza A/B: negative CT head without contrast 03/25/2021: Chronic atrophic changes without acute abnormality CT angio chest/abdomen/pelvis 03/25/2021: No evidence of dissection, no focal infiltrate or effusion seen, patent left iliac stent, fatty liver.  PCCM consulted for admission due  to need for vasopressor administration to support blood pressure.  Pertinent  Medical History  HFrEF COPD CKD Stage IIIa ICM PAD CAD BPH OSA Hypertension Hyperlipidemia Right subclavian artery stenosis Persistent atrial fibrillation Type 2 diabetes mellitus Tobacco abuse Unstable angina Significant Hospital Events: Including procedures, antibiotic start and stop dates in addition to other pertinent events   03/25/2021-patient admitted to ICU quiring low-dose vasopressor support in the setting of suspected cardiogenic shock 03/26/2021: Continues to require Levophed (currently at 3 mcg); start Midodrine, repeat ECHO and consult Cardiology 03/27/21: Weaned off pressors; diuresis resumed by Cardiology  Interim History / Subjective:  -No acute events reported overnight -Afebrile, on 2L [address], weaned off levophed  -Leukocytosis resolved, Procalcitonin negative x2 -Creatinine improved to 0.95 (1.3 yesterday) -UOP 4.2 L last 24 hrs (net - 3.5L since admit) with initiation of 80 mg Lasix BID -Echocardiogram yesterday 03/26/21 with LVEF 20-25%, unable to evaluate diastolic parameters, severely reduced RV systolic function, severely elevated pulmonary artery pressure, moderate to severe Tricuspid regurgitation -Denies chest pain, dizziness, palpitations, SOB, wheezing -Assessed left lower extremity, cool to touch but reddened area remains with some trace pitting edema.  Per patient report this is much improved.   Objective   Blood pressure 107/61, pulse 73, temperature 98.1 F (36.7 C), temperature source Oral, resp. rate 17, height '5\' 7"'$  (1.702 m), weight 92.1 kg, SpO2 99 %.        Intake/Output Summary (Last 24 hours) at 03/27/2021 0807 Last data filed at 03/27/2021 0715 Gross per 24 hour  Intake 550.49 ml  Output 2900 ml  Net -2349.51 ml    Filed Weights   03/25/21 1955 03/26/21 1814 03/27/21 0500  Weight: 81.6 kg 87.2 kg 92.1 kg    Examination: General: Adult male, acute  on chronically ill, lying in bed,  NAD HEENT: MM pink/moist, anicteric, atraumatic, neck supple Neuro: A&O x 4, able to follow commands, PERRL +3, MAE-generalized weakness CV: s1s2 irregular, controlled A. 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  LLE edema is improved and labs not indicative of any active infection.  BNP 1115, however patient does not appear fluid volume overloaded. -Continuous cardiac monitoring -Maintain MAP >60 -Vasopressors as needed to maintain MAP goal ~currently weaned off -Continue Midodrine -Lactic acid normalized (1.4 ~ 1.1) -HS Troponin peaked at 51 -Repeat Echocardiogram 03/26/21 LVEF 20-25%, unable to evaluate diastolic parameters, severely reduced RV systolic function, severely elevated pulmonary artery pressure, moderate to severe Tricuspid regurgitation -Cardiology following, appreciate input -Diuresis as BP and renal function permits ~ currently on Lasix 80 mg IV BID -Continue home Eliquis BID -Continue Plavix and Atorvastatin -Currently holding Coreg, Imdur, Spironolactone, and Losartan  COPD without acute exacerbation PMHx of Asthma, COPD, OSA (does not use/does not tolerate CPAP) -Supplemental O2 PRN, to maintain SPO2 > 88% -Follow intermittent CXR &  ABG as needed -Continue home fluticasone-umeclidin-vilant inhaler -Scheduled Duo Nebs  CKD stage IIIa ~ improving Mild Hyperkalemia ~ resolved Mild Hyponatremia ~ resolved BPH -Monitor I&O's / urinary output -Follow BMP -Ensure adequate renal perfusion -Avoid nephrotoxic agents as able -Replace electrolytes as indicated -Continue home Flomax  Type 2 Diabetes Mellitus Neuropathy - continue outpatient gabapentin for neuropathy - Monitor CBG AC, HS - SSI moderate dosing - target range while in ICU: 140-180 - follow ICU hyper/hypo-glycemia protocol  Hyperlipidemia -Continue home atorvastatin & Zetia  PAD - continue outpatient Plavix  Tobacco abuse -Smoking cessation counseling provided -Nicotine patch ordered  Best Practice (right click and "Reselect all SmartList Selections" daily)  Diet/type: Regular consistency (see orders) DVT prophylaxis: DOAC GI prophylaxis: N/A Lines: N/A Foley:  N/A Code Status:  full code Last date of multidisciplinary goals of care discussion [03/27/21]  Labs   CBC: Recent Labs  Lab 03/25/21 2005 03/26/21 0517 03/27/21 0600  WBC 7.4 11.1* 6.9  NEUTROABS 6.0  --   --   HGB 10.6* 11.6* 10.9*  HCT 31.6* 34.9* 31.7*  MCV 81.7 82.9 81.1  PLT 154 186 120*     Basic Metabolic Panel: Recent Labs  Lab 03/21/21 0556 03/25/21 2005 03/26/21 0517 03/26/21 1203 03/27/21 0600  NA 128* 131* 133*  --  136  K 4.2 4.5 5.2* 4.6 3.9  CL 92* 90* 93*  --  94*  CO2 '30 30 29  '$ --  32  GLUCOSE 106* 113* 126*  --  91  BUN 30* 39* 35*  --  27*  CREATININE 1.18 1.40* 1.30*  --  0.95  CALCIUM 8.5* 9.0 8.9  --  9.0  MG 2.0  --  1.9  --  1.9  PHOS  --   --  4.5  --  3.6  GFR: Estimated Creatinine Clearance: 91.7 mL/min (by C-G formula based on SCr of 0.95 mg/dL). Recent Labs  Lab 03/25/21 2005 03/25/21 2158 03/26/21 0015 03/26/21 0517 03/27/21 0600  PROCALCITON <0.10  --   --   --  <0.10  WBC 7.4  --   --  11.1* 6.9  LATICACIDVEN  --   1.4 1.1  --   --      Liver Function Tests: Recent Labs  Lab 03/25/21 2005  AST 34  ALT 23  ALKPHOS 91  BILITOT 1.5*  PROT 6.9  ALBUMIN 3.9    No results for input(s): LIPASE, AMYLASE in the last 168 hours. No results for input(s): AMMONIA in the last 168 hours.  ABG    Component Value Date/Time   HCO3 36.4 (H) 08/30/2019 2158   O2SAT 79.2 06/21/2020 0602      Coagulation Profile: No results for input(s): INR, PROTIME in the last 168 hours.  Cardiac Enzymes: No results for input(s): CKTOTAL, CKMB, CKMBINDEX, TROPONINI in the last 168 hours.  HbA1C: Hemoglobin A1C  Date/Time Value Ref Range Status  04/04/2013 04:16 AM 9.8 (H) 4.2 - 6.3 % Final    Comment:    The American Diabetes Association recommends that a primary goal of therapy should be <7% and that physicians should reevaluate the treatment regimen in patients with HbA1c values consistently >8%.    Hgb A1c MFr Bld  Date/Time Value Ref Range Status  03/18/2021 11:10 AM 6.2 (H) 4.8 - 5.6 % Final    Comment:    (NOTE)         Prediabetes: 5.7 - 6.4         Diabetes: >6.4         Glycemic control for adults with diabetes: <7.0   08/06/2020 04:03 AM 6.5 (H) 4.8 - 5.6 % Final    Comment:    (NOTE) Pre diabetes:          5.7%-6.4%  Diabetes:              >6.4%  Glycemic control for   <7.0% adults with diabetes     CBG: Recent Labs  Lab 03/26/21 1205 03/26/21 1947 03/26/21 2336 03/27/21 0415 03/27/21 0730  GLUCAP 144* 165* 102* 111* 131*     Review of Systems: Positives in BOLD  Gen: Denies fever, chills, weight change, fatigue, night sweats HEENT: Denies blurred vision, double vision, hearing loss, tinnitus, sinus congestion, rhinorrhea, sore throat, neck stiffness, dysphagia PULM: Denies shortness of breath, cough, sputum production, hemoptysis, wheezing CV: Denies chest pain, edema, orthopnea, paroxysmal nocturnal dyspnea, palpitations GI: Denies abdominal pain, nausea, vomiting,  diarrhea, hematochezia, melena, constipation, change in bowel habits GU: Denies dysuria, hematuria, polyuria, oliguria, urethral discharge Endocrine: Denies hot or cold intolerance, polyuria, polyphagia or appetite change Derm: Denies rash, dry skin, scaling or peeling skin change Heme: Denies easy bruising, bleeding, bleeding gums Neuro: Denies headache, numbness, weakness, slurred speech, loss of memory or consciousness Past Medical History:  He,  has a past medical history of Acute on chronic heart failure ([place]), AKI (acute kidney injury) ([place]) (01/03/2018), Angina at rest [facility]) (08/13/2017), Arthritis, Asthma, Atherosclerosis, BPH (benign prostatic hyperplasia), Carotid arterial disease ([place]), Cellulitis of foot, left (09/27/2019), Charcot's joint of foot, right, Chronic combined systolic (congestive) and diastolic (congestive) heart failure ([place]), Chronic foot pain (Secondary Area of Pain) (Right) (01/29/2018), COPD (chronic obstructive pulmonary disease) ([place]), COPD with acute exacerbation ([place]) (01/30/2020), Coronary artery disease, Diabetic neuropathy ([place]), Diabetic ulcer of right foot ([place]),  Hernia, HFrEF (heart failure with reduced ejection fraction) ([place]) (01/30/2020), Hyperlipidemia, Hypertension, Ischemic cardiomyopathy, Morbid obesity ([place]), Neuropathy, PAD (peripheral artery disease) ([place]), Persistent atrial fibrillation ([place]), Restless leg syndrome, Sepsis ([place]) (03/30/2018), Sleep apnea, Subclavian artery stenosis, right ([place]), Syncope and collapse, Tobacco use, Type II diabetes mellitus ([place]), Unstable angina ([place]) (07/11/2016), and Varicose vein.   Surgical History:   Past Surgical History:  Procedure Laterality Date   ABDOMINAL AORTAGRAM N/A 06/23/2013   Procedure: ABDOMINAL Maxcine Ham;  Surgeon: Wellington Hampshire, MD;  Location: [place] CATH LAB;  Service: Cardiovascular;  Laterality: N/A;   AMPUTATION Right 04/01/2018   Procedure: AMPUTATION BELOW KNEE;  Surgeon: Algernon Huxley, MD;   Location: [facility] ORS;  Service: Vascular;  Laterality: Right;   AMPUTATION TOE Left 08/11/2020   Procedure: AMPUTATION TOE-2nd Toe;  Surgeon: Samara Deist, DPM;  Location: [facility] ORS;  Service: Podiatry;  Laterality: Left;   APPENDECTOMY     CARDIAC CATHETERIZATION  10/14   [place]; X1 STENT PROXIMAL RCA   CARDIAC CATHETERIZATION  02/2010   [facility]   CARDIAC CATHETERIZATION  02/21/2014   [facility]   CLAVICLE SURGERY     HERNIA REPAIR     IRRIGATION AND DEBRIDEMENT FOOT Right 01/04/2018   Procedure: IRRIGATION AND DEBRIDEMENT FOOT;  Surgeon: Samara Deist, DPM;  Location: [facility] ORS;  Service: Podiatry;  Laterality: Right;   LEFT HEART CATH AND CORONARY ANGIOGRAPHY N/A 01/20/2020   Procedure: LEFT HEART CATH AND CORONARY ANGIOGRAPHY;  Surgeon: Wellington Hampshire, MD;  Location: [place] CV LAB;  Service: Cardiovascular;  Laterality: N/A;   LEFT HEART CATH AND CORS/GRAFTS ANGIOGRAPHY N/A 04/28/2017   Procedure: LEFT HEART CATH AND CORONARY ANGIOGRAPHY;  Surgeon: Wellington Hampshire, MD;  Location: [place] CV LAB;  Service: Cardiovascular;  Laterality: N/A;   LOWER EXTREMITY ANGIOGRAPHY Right 03/03/2018   Procedure: LOWER EXTREMITY ANGIOGRAPHY;  Surgeon: Katha Cabal, MD;  Location: [place] CV LAB;  Service: Cardiovascular;  Laterality: Right;   LOWER EXTREMITY ANGIOGRAPHY Left 08/24/2018   Procedure: LOWER EXTREMITY ANGIOGRAPHY;  Surgeon: Algernon Huxley, MD;  Location: [place] CV LAB;  Service: Cardiovascular;  Laterality: Left;   LOWER EXTREMITY ANGIOGRAPHY Left 06/14/2019   Procedure: LOWER EXTREMITY ANGIOGRAPHY;  Surgeon: Algernon Huxley, MD;  Location: [place] CV LAB;  Service: Cardiovascular;  Laterality: Left;   LOWER EXTREMITY ANGIOGRAPHY Left 08/02/2019   Procedure: LOWER EXTREMITY ANGIOGRAPHY;  Surgeon: Algernon Huxley, MD;  Location: [place] CV LAB;  Service: Cardiovascular;  Laterality: Left;   LOWER EXTREMITY ANGIOGRAPHY Left 07/17/2020   Procedure: LOWER EXTREMITY  ANGIOGRAPHY;  Surgeon: Algernon Huxley, MD;  Location: [place] CV LAB;  Service: Cardiovascular;  Laterality: Left;   PERIPHERAL ARTERIAL STENT GRAFT     x2 left/right   PERIPHERAL VASCULAR BALLOON ANGIOPLASTY Right 01/06/2018   Procedure: PERIPHERAL VASCULAR BALLOON ANGIOPLASTY;  Surgeon: Katha Cabal, MD;  Location: [place] CV LAB;  Service: Cardiovascular;  Laterality: Right;   PERIPHERAL VASCULAR CATHETERIZATION N/A 07/06/2015   Procedure: Abdominal Aortogram w/Lower Extremity;  Surgeon: Algernon Huxley, MD;  Location: [place] CV LAB;  Service: Cardiovascular;  Laterality: N/A;   PERIPHERAL VASCULAR CATHETERIZATION  07/06/2015   Procedure: Lower Extremity Intervention;  Surgeon: Algernon Huxley, MD;  Location: [place] CV LAB;  Service: Cardiovascular;;     Social History:   reports that he has been smoking cigarettes. He has a 41.00 pack-year smoking history. He has never used smokeless tobacco. He reports that he does not  drink alcohol and does not use drugs.   Family History:  His family history includes Heart attack in his father; Heart disease in his father; Hyperlipidemia in his father and mother; Hypertension in his father and mother.   Allergies Allergies  Allergen Reactions   Dulaglutide Anaphylaxis, Diarrhea and Hives   Ace Inhibitors     hypotension   Other Itching    Skin itching associated with nitro patch   Prednisone Rash     Home Medications  Prior to Admission medications   Medication Sig Start Date End Date Taking? Authorizing Provider  albuterol (VENTOLIN HFA) 108 (90 Base) MCG/ACT inhaler Inhale into the lungs every 6 (six) hours as needed for wheezing or shortness of breath.    [provider]  amoxicillin-clavulanate (AUGMENTIN) 875-125 MG tablet Take 1 tablet by mouth every 12 (twelve) hours. 03/21/21   Samuella Cota, MD  apixaban (ELIQUIS) 5 MG TABS tablet Take 5 mg by mouth 2 (two) times daily.    [provider]   atorvastatin (LIPITOR) 80 MG tablet Take 1 tablet (80 mg total) by mouth daily. 10/09/20   Minna Merritts, MD  carvedilol (COREG) 3.125 MG tablet TAKE 1 TABLET BY MOUTH TWICE DAILY WITH MEALS 03/16/21   Minna Merritts, MD  clopidogrel (PLAVIX) 75 MG tablet Take 1 tablet (75 mg total) by mouth daily. 10/09/20   Minna Merritts, MD  digoxin (LANOXIN) 0.125 MG tablet Take 0.125 mg by mouth daily.    [provider]  DULoxetine (CYMBALTA) 60 MG capsule Take 60 mg by mouth daily.    [provider]  ezetimibe (ZETIA) 10 MG tablet Take 1 tablet (10 mg total) by mouth daily. 10/09/20   Minna Merritts, MD  Fluticasone-Umeclidin-Vilant 100-62.5-25 MCG/INH AEPB Inhale 1 puff into the lungs daily.    [provider]  gabapentin (NEURONTIN) 800 MG tablet Take 800 mg by mouth 4 (four) times daily.  08/14/17 04/04/29  [provider]  ipratropium-albuterol (DUONEB) 0.5-2.5 (3) MG/3ML SOLN Inhale 3 mLs into the lungs every 6 (six) hours as needed (wheezing/sob).     [provider]  isosorbide mononitrate (IMDUR) 30 MG 24 hr tablet Take 1 tablet (30 mg total) by mouth daily. 10/09/20   Minna Merritts, MD  losartan (COZAAR) 25 MG tablet Take 0.5 tablets (12.5 mg total) by mouth daily. 10/09/20   Minna Merritts, MD  metFORMIN (GLUCOPHAGE-XR) 500 MG 24 hr tablet Take 1,000 mg by mouth 2 (two) times daily. 01/02/21   [provider]  metolazone (ZAROXOLYN) 2.5 MG tablet Take 1 tablet (2.5 mg total) by mouth daily as needed. Take 1/2 hour before morning torsemide 01/23/21 04/23/21  Alisa Graff, FNP  Multiple Vitamin (MULTI-VITAMINS) TABS Take 1 tablet by mouth daily.     [provider]  nitroGLYCERIN (NITROSTAT) 0.4 MG SL tablet Place 1 tablet (0.4 mg total) under the tongue every 5 (five) minutes as needed for chest pain. 10/09/20   Minna Merritts, MD  omeprazole (PRILOSEC) 40 MG capsule Take 40 mg by mouth daily.    [provider]   Oxycodone HCl 10 MG TABS Take 10 mg by mouth every 4 (four) hours as needed (pain).    [provider]  polyethylene glycol (MIRALAX / GLYCOLAX) 17 g packet Take 17 g by mouth daily as needed for mild constipation. 08/12/20   Loletha Grayer, MD  potassium chloride (KLOR-CON) 10 MEQ tablet Take 1 tablet (10 mEq total) by  mouth 2 (two) times daily. 10/09/20   Minna Merritts, MD  spironolactone (ALDACTONE) 25 MG tablet Take 0.5 tablets (12.5 mg total) by mouth daily. Patient taking differently: Take 25 mg by mouth daily. 10/09/20   Minna Merritts, MD  tamsulosin (FLOMAX) 0.4 MG CAPS capsule Take 1 capsule (0.4 mg total) by mouth daily after supper. 07/01/20   Shawna Clamp, MD  torsemide (DEMADEX) 20 MG tablet Take 40 mg by mouth daily.    [provider]  traZODone (DESYREL) 150 MG tablet Take 150 mg by mouth at bedtime. 07/12/19   [provider]  VERQUVO 10 MG TABS TAKE 1 TABLET BY MOUTH ONCE DAILY 11/16/20   Alisa Graff, FNP      Darel Hong, AGACNP-BC  Pulmonary & [place] epic messenger for cross cover needs If after hours, please call E-link  ICU ATTENDING ATTESTATION:  Patient seen and examined and relevant ancillary tests reviewed.   I agree with the assessment and plan of care as outlined by Darel Hong NP.  This patient was not seen as a shared visit. The following reflects my independent critical care time.  I  personally  reviewed database in its entirety and discussed care plan in detail. In addition, this patient was discussed on multidisciplinary rounds.   I agree with assessment and plan.  OK to transfer to [facility]   Corrin Parker, M.D.  [facility]  Medical Director [place] Director [facility] Cardio-Pulmonary Department

## 2021-03-27 NOTE — Progress Notes (Signed)
Progress Note  Patient Name: [name] [name] Date of Encounter: 03/27/2021  [facility] Cardiologist: Ida Rogue, MD   Subjective   Patient feeling better with improved shortness of breath and resolution of chest pain.  He is now off vasopressors.  Inpatient Medications    Scheduled Meds:  apixaban  5 mg Oral BID   atorvastatin  80 mg Oral Daily   Chlorhexidine Gluconate Cloth  6 each Topical Daily   clopidogrel  75 mg Oral Daily   digoxin  0.125 mg Oral Daily   DULoxetine  60 mg Oral Daily   ezetimibe  10 mg Oral Daily   fluticasone furoate-vilanterol  1 puff Inhalation Daily   And   umeclidinium bromide  1 puff Inhalation Daily   furosemide  80 mg Intravenous BID   gabapentin  800 mg Oral QID   insulin aspart  0-15 Units Subcutaneous TID AC & HS   ipratropium-albuterol  3 mL Nebulization Q6H   midodrine  10 mg Oral TID WC   nicotine  21 mg Transdermal Daily   pantoprazole (PROTONIX) IV  40 mg Intravenous QHS   tamsulosin  0.4 mg Oral QPC supper   traZODone  150 mg Oral QHS   Continuous Infusions:  sodium chloride Stopped (03/27/21 0644)   norepinephrine (LEVOPHED) Adult infusion Stopped (03/26/21 2117)   PRN Meds: acetaminophen, docusate sodium, ondansetron (ZOFRAN) IV, oxyCODONE-acetaminophen, polyethylene glycol   Vital Signs    Vitals:   03/27/21 1400 03/27/21 1500 03/27/21 1600 03/27/21 1700  BP: 106/71 108/81 116/73 98/86  Pulse: 90 89 90 84  Resp: '16 15 14 20  '$ Temp:   99.4 F (37.4 C)   TempSrc:   Oral   SpO2: 98% 100% 99% 97%  Weight:      Height:        Intake/Output Summary (Last 24 hours) at 03/27/2021 1801 Last data filed at 03/27/2021 1430 Gross per 24 hour  Intake 1030.49 ml  Output 3900 ml  Net -2869.51 ml   Last 3 Weights 03/27/2021 03/26/2021 03/25/2021  Weight (lbs) 203 lb 0.7 oz 192 lb 3.9 oz 180 lb  Weight (kg) 92.1 kg 87.2 kg 81.647 kg  Some encounter information is confidential and restricted. Go to Review Flowsheets  activity to see all data.      Telemetry    Atrial fibrillation with underlying bundle branch block and occasional PVCs.  Ventricular rate 70-115 bpm. - Personally Reviewed  ECG    No new tracing.  Physical Exam   GEN: No acute distress.   Neck: JVP 8-10 cm. Cardiac: Irregularly irregular with 1/6 systolic murmur. Respiratory: Fair air movement with bibasilar crackles. GI: Soft, nontender, non-distended  MS: 1+ pretibial edema; No deformity. Neuro:  Nonfocal  Psych: Normal affect   Labs    High Sensitivity Troponin:   Recent Labs  Lab 03/18/21 1110 03/18/21 1317 03/25/21 2005 03/26/21 0015  TROPONINIHS 73* 68* 51* 50*     Chemistry Recent Labs  Lab 03/21/21 0556 03/25/21 2005 03/26/21 0517 03/26/21 1203 03/27/21 0600  NA 128* 131* 133*  --  136  K 4.2 4.5 5.2* 4.6 3.9  CL 92* 90* 93*  --  94*  CO2 '30 30 29  '$ --  32  GLUCOSE 106* 113* 126*  --  91  BUN 30* 39* 35*  --  27*  CREATININE 1.18 1.40* 1.30*  --  0.95  CALCIUM 8.5* 9.0 8.9  --  9.0  MG 2.0  --  1.9  --  1.9  PROT  --  6.9  --   --   --   ALBUMIN  --  3.9  --   --   --   AST  --  34  --   --   --   ALT  --  23  --   --   --   ALKPHOS  --  91  --   --   --   BILITOT  --  1.5*  --   --   --   GFRNONAA >60 58* >60  --  >60  ANIONGAP '6 11 11  '$ --  10    Lipids No results for input(s): CHOL, TRIG, HDL, LABVLDL, LDLCALC, CHOLHDL in the last 168 hours.  Hematology Recent Labs  Lab 03/25/21 2005 03/26/21 0517 03/27/21 0600  WBC 7.4 11.1* 6.9  RBC 3.87* 4.21* 3.91*  HGB 10.6* 11.6* 10.9*  HCT 31.6* 34.9* 31.7*  MCV 81.7 82.9 81.1  MCH 27.4 27.6 27.9  MCHC 33.5 33.2 34.4  RDW 21.7* 21.5* 21.4*  PLT 154 186 120*   Thyroid No results for input(s): TSH, FREET4 in the last 168 hours.  BNP Recent Labs  Lab 03/25/21 2005  BNP 1,115.4*    DDimer No results for input(s): DDIMER in the last 168 hours.   Radiology    DG Chest 2 View  Result Date: 03/25/2021 CLINICAL DATA:  Shortness of  breath, possible COVID exposure EXAM: CHEST - 2 VIEW COMPARISON:  03/18/2021 FINDINGS: Cardiac shadow is enlarged but stable. Aortic calcifications are again seen and stable. The lungs are well aerated bilaterally. Mild increase in central vascular congestion is noted without interstitial edema. No focal infiltrate or effusion is noted. No acute bony abnormality is seen. IMPRESSION: Slight increase in vascular congestion when compared with the prior exam. No other focal abnormality is noted. Electronically Signed   By: Inez Catalina M.D.   On: 03/25/2021 20:51   CT HEAD WO CONTRAST (5MM)  Result Date: 03/25/2021 CLINICAL DATA:  Headaches and history of possible COVID exposure EXAM: CT HEAD WITHOUT CONTRAST TECHNIQUE: Contiguous axial images were obtained from the base of the skull through the vertex without intravenous contrast. COMPARISON:  04/09/2015 FINDINGS: Brain: No evidence of acute infarction, hemorrhage, hydrocephalus, extra-axial collection or mass lesion/mass effect. Mild atrophic changes are noted. Vascular: No hyperdense vessel or unexpected calcification. Skull: Normal. Negative for fracture or focal lesion. Sinuses/Orbits: No acute finding. Other: None. IMPRESSION: Chronic atrophic changes without acute abnormality. Electronically Signed   By: Inez Catalina M.D.   On: 03/25/2021 22:36   ECHOCARDIOGRAM COMPLETE  Result Date: 03/26/2021    ECHOCARDIOGRAM REPORT   Patient Name:   [name] [name] Date of Exam: 03/26/2021 Medical Rec #:  [id number]      Height:       67.0 in Accession #:    [id number]     Weight:       180.0 lb Date of Birth:  1961-05-11       BSA:          1.934 m Patient Age:    60 years       BP:           110/87 mmHg Patient Gender: M              HR:           101 bpm. Exam Location:  [facility] Procedure: 2D Echo, Cardiac Doppler and Color Doppler Indications:  Shock  History:         Patient has prior history of Echocardiogram examinations, most                  recent 06/17/2020.  CAD. Ischemic cardiomyopathy, Persistent Afib.  Sonographer:     Sherrie Sport Referring Phys:  [id number] Bradly Bienenstock Diagnosing Phys: Nelva Bush MD IMPRESSIONS  1. Left ventricular ejection fraction, by estimation, is 20 to 25%. The left ventricle has severely decreased function. The left ventricle demonstrates global hypokinesis. The left ventricular internal cavity size was severely dilated. Left ventricular diastolic function could not be evaluated.  2. Right ventricular systolic function is severely reduced. The right ventricular size is mildly enlarged. There is severely elevated pulmonary artery systolic pressure.  3. Left atrial size was mildly dilated.  4. Right atrial size was severely dilated.  5. The mitral valve is abnormal. Moderate mitral valve regurgitation.  6. Tricuspid valve regurgitation is moderate to severe.  7. The aortic valve has an indeterminant number of cusps. There is moderate calcification of the aortic valve. There is moderate thickening of the aortic valve. Aortic valve regurgitation is not visualized. There appears to be mild aortic stenosis, with  LVOT gradient underestimated due to low LVEF.  8. The inferior vena cava is normal in size with <50% respiratory variability, suggesting right atrial pressure of 8 mmHg. FINDINGS  Left Ventricle: Left ventricular ejection fraction, by estimation, is 20 to 25%. The left ventricle has severely decreased function. The left ventricle demonstrates global hypokinesis. The left ventricular internal cavity size was severely dilated. There is no left ventricular hypertrophy. Left ventricular diastolic function could not be evaluated due to atrial fibrillation. Left ventricular diastolic function could not be evaluated. Right Ventricle: The right ventricular size is mildly enlarged. No increase in right ventricular wall thickness. Right ventricular systolic function is severely reduced. There is severely elevated pulmonary artery systolic  pressure. The tricuspid regurgitant velocity is 3.94 m/s, and with an assumed right atrial pressure of 8 mmHg, the estimated right ventricular systolic pressure is [id number] mmHg. Left Atrium: Left atrial size was mildly dilated. Right Atrium: Right atrial size was severely dilated. Pericardium: There is no evidence of pericardial effusion. Mitral Valve: The mitral valve is abnormal. Mild mitral annular calcification. Moderate mitral valve regurgitation. Tricuspid Valve: The tricuspid valve is not well visualized. Tricuspid valve regurgitation is moderate to severe. Aortic Valve: The aortic valve has an indeterminant number of cusps. There is moderate calcification of the aortic valve. There is moderate thickening of the aortic valve. Aortic valve regurgitation is not visualized. There appears to be mild aortic stenosis, with LVOT gradient underestimated due to low LVEF. Aortic valve mean gradient measures 3.7 mmHg. Aortic valve peak gradient measures 6.7 mmHg. Aortic valve area, by VTI measures 1.48 cm. Pulmonic Valve: The pulmonic valve was not well visualized. Pulmonic valve regurgitation is not visualized. No evidence of pulmonic stenosis. Aorta: The aortic root is normal in size and structure. Pulmonary Artery: The pulmonary artery is not well seen. Venous: The inferior vena cava is normal in size with less than 50% respiratory variability, suggesting right atrial pressure of 8 mmHg. IAS/Shunts: No atrial level shunt detected by color flow Doppler.  LEFT VENTRICLE PLAX 2D LVIDd:         6.44 cm LVIDs:         5.72 cm LV PW:         0.98 cm LV IVS:  0.81 cm LVOT diam:     2.10 cm LV SV:         29 LV SV Index:   15 LVOT Area:     3.46 cm  LV Volumes (MOD) LV vol d, MOD A2C: 201.0 ml LV vol d, MOD A4C: 201.0 ml LV vol s, MOD A2C: 196.0 ml LV vol s, MOD A4C: 174.0 ml LV SV MOD A2C:     5.0 ml LV SV MOD A4C:     201.0 ml LV SV MOD BP:      15.8 ml RIGHT VENTRICLE RV Basal diam:  4.99 cm RV S prime:     8.27 cm/s  TAPSE (M-mode): 0.8 cm LEFT ATRIUM             Index        RIGHT ATRIUM           Index LA diam:        4.30 cm 2.22 cm/m   RA Area:     32.50 cm LA Vol (A2C):   90.5 ml 46.80 ml/m  RA Volume:   128.00 ml 66.19 ml/m LA Vol (A4C):   75.3 ml 38.94 ml/m LA Biplane Vol: 87.9 ml 45.46 ml/m  AORTIC VALVE                    PULMONIC VALVE AV Area (Vmax):    1.15 cm     PV Vmax:        0.62 m/s AV Area (Vmean):   1.10 cm     PV Peak grad:   1.5 mmHg AV Area (VTI):     1.48 cm     RVOT Peak grad: 2 mmHg AV Vmax:           129.77 cm/s AV Vmean:          85.467 cm/s AV VTI:            0.196 m AV Peak Grad:      6.7 mmHg AV Mean Grad:      3.7 mmHg LVOT Vmax:         43.10 cm/s LVOT Vmean:        27.100 cm/s LVOT VTI:          0.084 m LVOT/AV VTI ratio: 0.43  AORTA Ao Root diam: 3.30 cm MITRAL VALVE                TRICUSPID VALVE MV Area (PHT): 4.63 cm     TR Peak grad:   62.1 mmHg MV Decel Time: 164 msec     TR Vmax:        394.00 cm/s MV E velocity: 107.00 cm/s                             SHUNTS                             Systemic VTI:  0.08 m                             Systemic Diam: 2.10 cm Nelva Bush MD Electronically signed by Nelva Bush MD Signature Date/Time: 03/26/2021/4:16:21 PM    Final    CT Angio Chest/Abd/Pel for Dissection W and/or Wo Contrast  Result Date: 03/25/2021 CLINICAL DATA:  Chest and abdominal pain, possible COVID  exposure EXAM: CT ANGIOGRAPHY CHEST, ABDOMEN AND PELVIS TECHNIQUE: Non-contrast CT of the chest was initially obtained. Multidetector CT imaging through the chest, abdomen and pelvis was performed using the standard protocol during bolus administration of intravenous contrast. Multiplanar reconstructed images and MIPs were obtained and reviewed to evaluate the vascular anatomy. CONTRAST:  156m OMNIPAQUE IOHEXOL 350 MG/ML SOLN COMPARISON:  08/22/2020 FINDINGS: CTA CHEST FINDINGS Cardiovascular: Initial precontrast images demonstrates significant atherosclerotic  calcifications of the thoracic aorta. Post-contrast images demonstrate no aneurysmal dilatation or dissection. Coronary calcifications are seen. The heart is mildly enlarged in size. The pulmonary artery as visualized shows no central pulmonary embolus. Mediastinum/Nodes: Thoracic inlet is within normal limits. No sizable hilar or mediastinal adenopathy is noted. A few small right hilar lymph nodes are seen and likely reactive in nature. The esophagus as visualized is within normal limits. Lungs/Pleura: Lungs are well aerated bilaterally. No focal infiltrate or sizable effusion is seen. No parenchymal nodules are noted. Mild scarring in the left base is noted. Musculoskeletal: Mild degenerative changes of the thoracic spine are seen. No rib fractures are noted. No compression deformities are seen. Review of the MIP images confirms the above findings. CTA ABDOMEN AND PELVIS FINDINGS VASCULAR Aorta: Abdominal aorta demonstrates atherosclerotic calcifications without aneurysmal dilatation or dissection. Celiac: Atherosclerotic calcifications are noted without focal significant stenosis. SMA: Diffuse atherosclerotic calcifications are noted with areas of mild narrowing throughout the proximal superior mesenteric artery. Renals: Atherosclerotic calcifications are noted involving the renal arteries bilaterally. Mild stenoses are seen. Dual renal arteries are noted on the left. IMA: Patent without evidence of aneurysm, dissection, vasculitis or significant stenosis. Inflow: Iliacs demonstrate scattered atherosclerotic calcification as well as evidence of prior stenting on the left. No aneurysmal dilatation is seen. Multifocal areas of moderate narrowing are seen. Veins: No specific venous abnormality is noted. Review of the MIP images confirms the above findings. NON-VASCULAR Hepatobiliary: Fatty infiltration of the liver is seen. The gallbladder is within normal limits. Pancreas: Unremarkable. No pancreatic ductal  dilatation or surrounding inflammatory changes. Spleen: Normal in size without focal abnormality. Adrenals/Urinary Tract: Adrenal glands are within normal limits. Kidneys demonstrate a normal enhancement pattern bilaterally. No obstructive changes are seen. No renal calculi are noted. The bladder is well distended. Stomach/Bowel: Colon shows no obstructive or inflammatory changes. Appendix is not well visualized consistent with a prior surgical history. Small bowel and stomach appear within normal limits. Lymphatic: No significant lymphadenopathy is noted. Reproductive: Prostate is unremarkable. Other: No free fluid is noted. Changes of prior hernia repair are noted in the anterior abdominal wall. Some diastasis of the rectus muscles is seen. Musculoskeletal: No acute or significant osseous findings. Review of the MIP images confirms the above findings. IMPRESSION: CTA of the chest: No evidence of thoracic aortic aneurysm or dissection. No focal infiltrate or sizable effusion is seen. CTA of the abdomen and pelvis: Diffuse atherosclerotic calcifications involving the major vessels without aneurysm or dissection. Patent left iliac stent. Fatty liver. Changes of prior hernia repair in the midline anteriorly. Electronically Signed   By: MInez CatalinaM.D.   On: 03/25/2021 22:43    Cardiac Studies    1. Left ventricular ejection fraction, by estimation, is 20 to 25%. The  left ventricle has severely decreased function. The left ventricle  demonstrates global hypokinesis. The left ventricular internal cavity size  was severely dilated. Left ventricular  diastolic function could not be evaluated.   2. Right ventricular systolic function is severely reduced. The right  ventricular size is  mildly enlarged. There is severely elevated pulmonary  artery systolic pressure.   3. Left atrial size was mildly dilated.   4. Right atrial size was severely dilated.   5. The mitral valve is abnormal. Moderate mitral valve  regurgitation.   6. Tricuspid valve regurgitation is moderate to severe.   7. The aortic valve has an indeterminant number of cusps. There is  moderate calcification of the aortic valve. There is moderate thickening  of the aortic valve. Aortic valve regurgitation is not visualized. There  appears to be mild aortic stenosis, with   LVOT gradient underestimated due to low LVEF.   8. The inferior vena cava is normal in size with <50% respiratory  variability, suggesting right atrial pressure of 8 mmHg.   Patient Profile     60 y.o. 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  Assessment & Plan    Acute on chronic HFrEF: Symptoms improving.  Mr. Starleen Blue still appears volume overloaded.  He is net negative at least 4 L this admission though intakes not well recorded yesterday.  Continue furosemide 80 mg IV twice daily and digoxin 125 mcg daily.  Blood pressures remain soft.  Hopefully, some GDMT can be reinitiated tomorrow.  Coronary artery disease and demand ischemia: Chest pain resolved with diuresis.  Continue secondary prevention with clopidogrel and atorvastatin as well as apixaban in the setting of permanent atrial fibrillation.  No plans for coronary angiography this admission unless there are objective signs of worsening ischemia.  Minimal troponin elevation on admission most consistent with demand ischemia.  Permanent atrial fibrillation: Ventricular rate is reasonable.  Hopefully, can add back low-dose beta-blocker as soon as tomorrow.  Continue apixaban 5 mg twice daily.  Shock: Blood pressure improved.  Underlying etiology unclear though most likely combination of cardiogenic and possible sepsis.  Favor weaning of midodrine, as this medication will increase afterload and can worsen heart failure outcomes.  I will decrease  his dose to 5 mg 3 times daily with meals at this time.   For questions or updates, please contact Bee Please consult [email or web address] for contact info under [facility].     Signed, Nelva Bush, MD  03/27/2021, 6:01 PM

## 2021-03-27 NOTE — Progress Notes (Signed)
PHARMACY CONSULT NOTE - FOLLOW UP  Pharmacy Consult for Electrolyte Monitoring and Replacement   Recent Labs: Potassium (mmol/L)  Date Value  03/27/2021 3.9  06/08/2014 4.3   Magnesium (mg/dL)  Date Value  03/27/2021 1.9  02/06/2014 1.4 (L)   Calcium (mg/dL)  Date Value  03/27/2021 9.0   Calcium, Total (mg/dL)  Date Value  06/08/2014 9.2   Albumin (g/dL)  Date Value  03/25/2021 3.9  01/29/2018 3.3 (L)  03/07/2014 3.9   Phosphorus (mg/dL)  Date Value  03/27/2021 3.6   Sodium (mmol/L)  Date Value  03/27/2021 136  01/25/2021 140  06/08/2014 136     Assessment: 60 year old male presented with fatigue and chest pressure, SOB. Patient remains in the ICU. He is receiving diuresis. Pharmacy consult for electrolytes.   Goal of Therapy:  Electrolytes WNL   Plan:  --Will give potassium 40 mEq PO x 1 with Lasix this evening to prevent drop given ongoing diuresis --Follow up electrolytes with morning labs  Tawnya Crook, PharmD, BCPS Clinical Pharmacist 03/27/2021 2:08 PM

## 2021-03-28 ENCOUNTER — Other Ambulatory Visit: Payer: Self-pay

## 2021-03-28 DIAGNOSIS — I5023 Acute on chronic systolic (congestive) heart failure: Secondary | ICD-10-CM | POA: Diagnosis not present

## 2021-03-28 LAB — GLUCOSE, CAPILLARY
Glucose-Capillary: 126 mg/dL — ABNORMAL HIGH (ref 70–99)
Glucose-Capillary: 226 mg/dL — ABNORMAL HIGH (ref 70–99)
Glucose-Capillary: 107 mg/dL — ABNORMAL HIGH (ref 70–99)
Glucose-Capillary: 142 mg/dL — ABNORMAL HIGH (ref 70–99)

## 2021-03-28 LAB — BASIC METABOLIC PANEL
Anion gap: 10 (ref 5–15)
BUN: 29 mg/dL — ABNORMAL HIGH (ref 6–20)
CO2: 31 mmol/L (ref 22–32)
Calcium: 9.1 mg/dL (ref 8.9–10.3)
Chloride: 96 mmol/L — ABNORMAL LOW (ref 98–111)
Creatinine, Ser: 1.12 mg/dL (ref 0.61–1.24)
GFR, Estimated: >60 mL/min (ref >60)
Glucose, Bld: 117 mg/dL — ABNORMAL HIGH (ref 70–99)
Potassium: 4.3 mmol/L (ref 3.5–5.1)
Sodium: 137 mmol/L (ref 135–145)

## 2021-03-28 LAB — PROCALCITONIN: Procalcitonin: <0.1 ng/mL

## 2021-03-28 MED ORDER — GUAIFENESIN ER 600 MG PO TB12
600.0000 mg | ORAL_TABLET | Freq: Two times a day (BID) | ORAL | Status: DC | PRN
  Administered 2021-03-28: 600 mg via ORAL
  Filled 2021-03-28: qty 1

## 2021-03-28 MED ORDER — OXYCODONE-ACETAMINOPHEN 5-325 MG PO TABS
1.0000 | ORAL_TABLET | Freq: Four times a day (QID) | ORAL | Status: DC | PRN
Start: 2021-03-28 — End: 2021-03-30
  Administered 2021-03-28 – 2021-03-30 (×10): 1 via ORAL
  Filled 2021-03-28 (×10): qty 1

## 2021-03-28 MED ORDER — MIDODRINE HCL 5 MG PO TABS
2.5000 mg | ORAL_TABLET | Freq: Three times a day (TID) | ORAL | Status: DC
  Administered 2021-03-28 – 2021-03-29 (×5): 2.5 mg via ORAL
  Filled 2021-03-28 (×5): qty 1

## 2021-03-28 MED ORDER — FUROSEMIDE 10 MG/ML IJ SOLN
40.0000 mg | Freq: Two times a day (BID) | INTRAMUSCULAR | Status: DC
  Administered 2021-03-28 – 2021-03-30 (×4): 40 mg via INTRAVENOUS
  Filled 2021-03-28 (×4): qty 4

## 2021-03-28 MED ORDER — ALUM & MAG HYDROXIDE-SIMETH 200-200-20 MG/5ML PO SUSP
30.0000 mL | Freq: Four times a day (QID) | ORAL | Status: DC | PRN
  Administered 2021-03-28: 30 mL via ORAL
  Filled 2021-03-28: qty 30

## 2021-03-28 MED ORDER — PANTOPRAZOLE SODIUM 40 MG PO TBEC
40.0000 mg | DELAYED_RELEASE_TABLET | Freq: Every day | ORAL | Status: DC
  Administered 2021-03-28 – 2021-03-29 (×2): 40 mg via ORAL
  Filled 2021-03-28 (×2): qty 1

## 2021-03-28 NOTE — Progress Notes (Signed)
PHARMACY CONSULT NOTE - FOLLOW UP  Pharmacy Consult for Electrolyte Monitoring and Replacement   Recent Labs: Potassium (mmol/L)  Date Value  03/28/2021 4.3  06/08/2014 4.3   Magnesium (mg/dL)  Date Value  03/27/2021 1.9  02/06/2014 1.4 (L)   Calcium (mg/dL)  Date Value  03/28/2021 9.1   Calcium, Total (mg/dL)  Date Value  06/08/2014 9.2   Albumin (g/dL)  Date Value  03/25/2021 3.9  01/29/2018 3.3 (L)  03/07/2014 3.9   Phosphorus (mg/dL)  Date Value  03/27/2021 3.6   Sodium (mmol/L)  Date Value  03/28/2021 137  01/25/2021 140  06/08/2014 136     Assessment: 60 year old male presented with fatigue and chest pressure, SOB.Patient was transferred form ICU to telemetry on 03/27/21. He is receiving diuresis. Pharmacy consult for electrolytes.  Na, K, and Calcium all within normal limits today. No Phos or Mg labs done today.  Goal of Therapy:  Electrolytes WNL   Plan:  No need for electrolytes replacement today Will continue to monitor morning labs and replace electrolytes as needed.  No recommendation for daily potassium given d/t recent hyperkalemia.  Johne Buckle Rodriguez-Guzman PharmD, BCPS 03/28/2021 8:40 AM

## 2021-03-28 NOTE — Progress Notes (Signed)
PHARMACIST - PHYSICIAN COMMUNICATION  DR:   Dwyane Dee  CONCERNING: IV to Oral Route Change Policy  RECOMMENDATION: This patient is receiving PANTOPRAZOLE by the intravenous route.  Based on criteria approved by the Pharmacy and Therapeutics Committee, the intravenous medication(s) is/are being converted to the equivalent oral dose form(s).   DESCRIPTION: These criteria include: The patient is eating (either orally or via tube) and/or has been taking other orally administered medications for a least 24 hours The patient has no evidence of active gastrointestinal bleeding or impaired GI absorption (gastrectomy, short bowel, patient on TNA or NPO).  If you have questions about this conversion, please contact the Pharmacy Department  '[]'$   [phone] )  [facility] '[x]'$   [phone] )  [facility] '[]'$   [phone] )  [facility] '[]'$   [phone] )  [facility] '[]'$   [phone] )  [place] Rodriguez-Guzman PharmD, BCPS 03/28/2021 9:10 AM

## 2021-03-28 NOTE — Progress Notes (Signed)
Dr. Dwyane Dee notified that patient started complaining of 5/10 left chest pain after he ate dinner. VS stable and EKG performed. Will give Maalox per Dr. Dwyane Dee and continue to monitor.

## 2021-03-28 NOTE — Progress Notes (Signed)
PROGRESS NOTE    [name] [name]  [id number] DOB: Oct 23, 1960 DOA: 03/25/2021 PCP: [name], MD    Brief Narrative:  This 60 years old male with PMH significant for HFrEF, COPD, CKD stage IIIa, PAD, BPH, OSA, HTN, HLD, Persistent A. fib, type 2 diabetes, tobacco use presented in the ED after he developed acute onset of generalized fatigue along with chest pressure and shortness of breath while moving his lawn. Patient was hypotensive with severe shortness of breath on arrival requiring Levophed and high oxygen concentrations. CT angiogram no evidence of dissection,  effusion or PE. Patient was admitted in the ICU for cardiogenic shock.  Repeat echocardiogram showed EF 20 to 25%,  unable to evaluate diastolic parameters.  Blood pressure has improved.  Patient is off Levophed,  leukocytosis resolved.  Cardiology consulted for A. Fib/ CHF recommended,  aggressive diuresis. PCCM pickup 03/28/2021.   Assessment & Plan:   Principal Problem:   Acute on chronic HFrEF (heart failure with reduced ejection fraction) (HCC) Active Problems:   PAD (peripheral artery disease) (HCC)   HLD (hyperlipidemia)   Tobacco use   Type 2 diabetes mellitus with hyperlipidemia (HCC)   COPD (chronic obstructive pulmonary disease) (HCC)   Chronic a-fib (HCC)   BPH (benign prostatic hyperplasia)   Stage 3a chronic kidney disease (CKD) (HCC)   Cardiogenic shock (HCC)   Permanent atrial fibrillation (HCC)  Cardiogenic shock sec. to acute on chronic HFrEF: Patient was severely hypotensive requiring Levophed support on arrival. Blood pressure has improved, He is weaned off of Levophed. Labs not indicative of any active infection. Lactic acid 1.4> 1.1.,  Procalcitonin negative x 2. Patient has recently completed antibiotics course for LLE cellulitis PTA. BNP 1115, still appears volume overloaded. Repeat Echo : LVEF 20 to 25%, unable to evaluate diastolic parameters. Cardiology is following recommended  diuresis as BP and renal functions permits. Continue Lasix 80 mg IV twice daily.  Continue digoxin 125 mcg daily. Cardiogenic shock has resolved. CHF symptoms improving Continue midodrine, try to wean this down as this will increase afterload.  Permanent atrial fibrillation: Heart rate well controlled,  BP soft. Continue Eliquis 5 mg twice daily. Will resume low-dose beta-blocker.  Coronary artery disease/demand ischemia: Continue Plavix, Lipitor, and Eliquis in the setting of A. Fib No plans for coronary angiography at this admission unless there is any signs of worsening ischemia Mild troponin elevation most consistent with demand ischemia  COPD without acute exacerbation: Continue supplemental oxygen to maintain SPO2 above 88% Continue home inhalers. Continue scheduled duo nebs.  CKD stage IIIa: Avoid nephrotoxic medications,  renal functions back to baseline.  Hyperkalemia/hyponatremia: Resolved.  Type 2 diabetes with neuropathy: Continue gabapentin Continue sliding scale.  PAD :  Continue Plavix and statin  Tobacco abuse : smoking cessation counseling completed   DVT prophylaxis: Eliquis Code Status: Full code Family Communication: No family at bedside Disposition Plan:    Status is: Inpatient  Remains inpatient appropriate because: Needs aggressive diuresis for CHF exacerbation  Anticipated discharged home in 1 to 2 days.   Consultants:  Cardiology PCCM  Procedures: CTA chest, echocardiogram Antimicrobials:    Anti-infectives (From admission, onward)    Start     Dose/Rate Route Frequency Ordered Stop   03/26/21 0015  vancomycin (VANCOREADY) IVPB 2000 mg/400 mL        2,000 mg 200 mL/hr over 120 Minutes Intravenous  Once 03/26/21 0004 03/26/21 0350   03/25/21 2345  cefTRIAXone (ROCEPHIN) 2 g in sodium chloride 0.9 %  100 mL IVPB        2 g 200 mL/hr over 30 Minutes Intravenous  Once 03/25/21 2333 03/26/21 0059        Subjective: Patient  was seen and examined at bedside.  Overnight events noted.   Patient reports much improved. Patient is able to lie flat, blood pressure is still low,  He is getting Lasix 80 mg IV twice daily,  reports very good urine output.  Objective: Vitals:   03/28/21 0735 03/28/21 0747 03/28/21 0811 03/28/21 1101  BP:   98/84 126/86  Pulse:   87 100  Resp:   18 18  Temp:   98.6 F (37 C) 98 F (36.7 C)  TempSrc:   Oral Oral  SpO2: 99% 95% 91% 95%  Weight:      Height:        Intake/Output Summary (Last 24 hours) at 03/28/2021 1125 Last data filed at 03/28/2021 1026 Gross per 24 hour  Intake 1040 ml  Output 3250 ml  Net -2210 ml   Filed Weights   03/26/21 1814 03/27/21 0500 03/28/21 0600  Weight: 87.2 kg 92.1 kg 87.2 kg    Examination:  General exam: Appears comfortable, not in any acute distress. Respiratory system: Clear to auscultation bilaterally, respiratory effort normal, RR 15. Cardiovascular system: S1-S2 heard, irregular rhythm, no murmur.   Gastrointestinal system: Abdomen is soft, nontender, nondistended, BS +. Central nervous system: Alert and oriented x 3. No focal neurological deficits. Extremities: Right BKA, 1+ pedal edema in left lower extremity. Skin: No rashes, lesions or ulcers Psychiatry: Judgement and insight appear normal. Mood & affect appropriate.     Data Reviewed: I have personally reviewed following labs and imaging studies  CBC: Recent Labs  Lab 03/25/21 2005 03/26/21 0517 03/27/21 0600  WBC 7.4 11.1* 6.9  NEUTROABS 6.0  --   --   HGB 10.6* 11.6* 10.9*  HCT 31.6* 34.9* 31.7*  MCV 81.7 82.9 81.1  PLT 154 186 [id number]*   Basic Metabolic Panel: Recent Labs  Lab 03/25/21 2005 03/26/21 0517 03/26/21 1203 03/27/21 0600 03/28/21 0515  NA 131* 133*  --  136 137  K 4.5 5.2* 4.6 3.9 4.3  CL 90* 93*  --  94* 96*  CO2 30 29  --  32 31  GLUCOSE 113* 126*  --  91 117*  BUN 39* 35*  --  27* 29*  CREATININE 1.40* 1.30*  --  0.95 1.12  CALCIUM 9.0  8.9  --  9.0 9.1  MG  --  1.9  --  1.9  --   PHOS  --  4.5  --  3.6  --    GFR: Estimated Creatinine Clearance: 75.8 mL/min (by C-G formula based on SCr of 1.12 mg/dL). Liver Function Tests: Recent Labs  Lab 03/25/21 2005  AST 34  ALT 23  ALKPHOS 91  BILITOT 1.5*  PROT 6.9  ALBUMIN 3.9   No results for input(s): LIPASE, AMYLASE in the last 168 hours. No results for input(s): AMMONIA in the last 168 hours. Coagulation Profile: No results for input(s): INR, PROTIME in the last 168 hours. Cardiac Enzymes: No results for input(s): CKTOTAL, CKMB, CKMBINDEX, TROPONINI in the last 168 hours. BNP (last 3 results) No results for input(s): PROBNP in the last 8760 hours. HbA1C: No results for input(s): HGBA1C in the last 72 hours. CBG: Recent Labs  Lab 03/27/21 1537 03/27/21 1955 03/27/21 2203 03/28/21 0813 03/28/21 1104  GLUCAP 147* 188* 127* 126* 226*  Lipid Profile: No results for input(s): CHOL, HDL, LDLCALC, TRIG, CHOLHDL, LDLDIRECT in the last 72 hours. Thyroid Function Tests: No results for input(s): TSH, T4TOTAL, FREET4, T3FREE, THYROIDAB in the last 72 hours. Anemia Panel: No results for input(s): VITAMINB12, FOLATE, FERRITIN, TIBC, IRON, RETICCTPCT in the last 72 hours. Sepsis Labs: Recent Labs  Lab 03/25/21 2005 03/25/21 2158 03/26/21 0015 03/27/21 0600 03/28/21 0515  PROCALCITON <0.10  --   --  <0.10 <0.10  LATICACIDVEN  --  1.4 1.1  --   --     Recent Results (from the past 240 hour(s))  Culture, blood (routine x 2)     Status: None   Collection Time: 03/18/21 12:01 PM   Specimen: BLOOD  Result Value Ref Range Status   Specimen Description BLOOD LEFT FA  Final   Special Requests   Final    BOTTLES DRAWN AEROBIC AND ANAEROBIC Blood Culture adequate volume   Culture   Final    NO GROWTH 5 DAYS Performed at [facility], [address], [place], [place] [address]    Report Status 03/23/2021 FINAL  Final  Culture, blood (routine x 2)      Status: None   Collection Time: 03/18/21 12:02 PM   Specimen: BLOOD  Result Value Ref Range Status   Specimen Description BLOOD RIGHT FA  Final   Special Requests   Final    BOTTLES DRAWN AEROBIC AND ANAEROBIC Blood Culture adequate volume   Culture   Final    NO GROWTH 5 DAYS Performed at [facility], [address] [place], [place] [address]    Report Status 03/23/2021 FINAL  Final  Resp Panel by RT-PCR (Flu A&B, Covid) Nasopharyngeal Swab     Status: None   Collection Time: 03/18/21  2:43 PM   Specimen: Nasopharyngeal Swab; Nasopharyngeal(NP) swabs in vial transport medium  Result Value Ref Range Status   SARS Coronavirus 2 by RT PCR NEGATIVE NEGATIVE Final    Comment: (NOTE) SARS-CoV-2 target nucleic acids are NOT DETECTED.  The SARS-CoV-2 RNA is generally detectable in upper respiratory specimens during the acute phase of infection. The lowest concentration of SARS-CoV-2 viral copies this assay can detect is 138 copies/mL. A negative result does not preclude SARS-Cov-2 infection and should not be used as the sole basis for treatment or other patient management decisions. A negative result may occur with  improper specimen collection/handling, submission of specimen other than nasopharyngeal swab, presence of viral mutation(s) within the areas targeted by this assay, and inadequate number of viral copies(<138 copies/mL). A negative result must be combined with clinical observations, patient history, and epidemiological information. The expected result is Negative.  Fact Sheet for Patients:  EntrepreneurPulse.com.au  Fact Sheet for Healthcare Providers:  IncredibleEmployment.be  This test is no t yet approved or cleared by the [place] FDA and  has been authorized for detection and/or diagnosis of SARS-CoV-2 by FDA under an Emergency Use Authorization (EUA). This EUA will remain  in effect (meaning this test can be used)  for the duration of the COVID-19 declaration under Section 564(b)(1) of the Act, 21 U.S.C.section 360bbb-3(b)(1), unless the authorization is terminated  or revoked sooner.       Influenza A by PCR NEGATIVE NEGATIVE Final   Influenza B by PCR NEGATIVE NEGATIVE Final    Comment: (NOTE) The Xpert Xpress SARS-CoV-2/FLU/RSV plus assay is intended as an aid in the diagnosis of influenza from Nasopharyngeal swab specimens and should not be used as a sole  basis for treatment. Nasal washings and aspirates are unacceptable for Xpert Xpress SARS-CoV-2/FLU/RSV testing.  Fact Sheet for Patients: EntrepreneurPulse.com.au  Fact Sheet for Healthcare Providers: IncredibleEmployment.be  This test is not yet approved or cleared by the [place] FDA and has been authorized for detection and/or diagnosis of SARS-CoV-2 by FDA under an Emergency Use Authorization (EUA). This EUA will remain in effect (meaning this test can be used) for the duration of the COVID-19 declaration under Section 564(b)(1) of the Act, 21 U.S.C. section 360bbb-3(b)(1), unless the authorization is terminated or revoked.  Performed at [facility], [address] [place], [place] [address]   MRSA Next Gen by PCR, Nasal     Status: None   Collection Time: 03/18/21  6:07 PM   Specimen: Nasal Mucosa; Nasal Swab  Result Value Ref Range Status   MRSA by PCR Next Gen NOT DETECTED NOT DETECTED Final    Comment: (NOTE) The GeneXpert MRSA Assay (FDA approved for NASAL specimens only), is one component of a comprehensive MRSA colonization surveillance program. It is not intended to diagnose MRSA infection nor to guide or monitor treatment for MRSA infections. Test performance is not FDA approved in patients less than 80 years old. Performed at [facility], [address] [place], [place] [address]   CULTURE, BLOOD (ROUTINE X 2) w Reflex to ID Panel     Status: None    Collection Time: 03/18/21  8:20 PM   Specimen: BLOOD  Result Value Ref Range Status   Specimen Description BLOOD BLOOD RIGHT HAND  Final   Special Requests   Final    BOTTLES DRAWN AEROBIC AND ANAEROBIC Blood Culture adequate volume   Culture   Final    NO GROWTH 5 DAYS Performed at [facility], [address] [place], [place] [address]    Report Status 03/23/2021 FINAL  Final  CULTURE, BLOOD (ROUTINE X 2) w Reflex to ID Panel     Status: None   Collection Time: 03/18/21  8:20 PM   Specimen: BLOOD  Result Value Ref Range Status   Specimen Description BLOOD BLOOD LEFT HAND  Final   Special Requests   Final    BOTTLES DRAWN AEROBIC AND ANAEROBIC Blood Culture adequate volume   Culture   Final    NO GROWTH 5 DAYS Performed at [facility], [address] [place], [place] [address]    Report Status 03/23/2021 FINAL  Final  Resp Panel by RT-PCR (Flu A&B, Covid) Nasopharyngeal Swab     Status: None   Collection Time: 03/25/21  8:05 PM   Specimen: Nasopharyngeal Swab; Nasopharyngeal(NP) swabs in vial transport medium  Result Value Ref Range Status   SARS Coronavirus 2 by RT PCR NEGATIVE NEGATIVE Final    Comment: (NOTE) SARS-CoV-2 target nucleic acids are NOT DETECTED.  The SARS-CoV-2 RNA is generally detectable in upper respiratory specimens during the acute phase of infection. The lowest concentration of SARS-CoV-2 viral copies this assay can detect is 138 copies/mL. A negative result does not preclude SARS-Cov-2 infection and should not be used as the sole basis for treatment or other patient management decisions. A negative result may occur with  improper specimen collection/handling, submission of specimen other than nasopharyngeal swab, presence of viral mutation(s) within the areas targeted by this assay, and inadequate number of viral copies(<138 copies/mL). A negative result must be combined with clinical observations, patient history, and  epidemiological information. The expected result is Negative.  Fact Sheet for Patients:  EntrepreneurPulse.com.au  Fact Sheet for Healthcare Providers:  IncredibleEmployment.be  This test is no t yet approved or cleared by the [place] FDA and  has been authorized for detection and/or diagnosis of SARS-CoV-2 by FDA under an Emergency Use Authorization (EUA). This EUA will remain  in effect (meaning this test can be used) for the duration of the COVID-19 declaration under Section 564(b)(1) of the Act, 21 U.S.C.section 360bbb-3(b)(1), unless the authorization is terminated  or revoked sooner.       Influenza A by PCR NEGATIVE NEGATIVE Final   Influenza B by PCR NEGATIVE NEGATIVE Final    Comment: (NOTE) The Xpert Xpress SARS-CoV-2/FLU/RSV plus assay is intended as an aid in the diagnosis of influenza from Nasopharyngeal swab specimens and should not be used as a sole basis for treatment. Nasal washings and aspirates are unacceptable for Xpert Xpress SARS-CoV-2/FLU/RSV testing.  Fact Sheet for Patients: EntrepreneurPulse.com.au  Fact Sheet for Healthcare Providers: IncredibleEmployment.be  This test is not yet approved or cleared by the [place] FDA and has been authorized for detection and/or diagnosis of SARS-CoV-2 by FDA under an Emergency Use Authorization (EUA). This EUA will remain in effect (meaning this test can be used) for the duration of the COVID-19 declaration under Section 564(b)(1) of the Act, 21 U.S.C. section 360bbb-3(b)(1), unless the authorization is terminated or revoked.  Performed at [facility], [address] [place], [place] [address]   Blood culture (single)     Status: None (Preliminary result)   Collection Time: 03/25/21 10:00 PM   Specimen: BLOOD  Result Value Ref Range Status   Specimen Description BLOOD RIGHT ANTECUBITAL  Final   Special Requests    Final    BOTTLES DRAWN AEROBIC AND ANAEROBIC Blood Culture results may not be optimal due to an excessive volume of blood received in culture bottles   Culture   Final    NO GROWTH 3 DAYS Performed at [facility], [address] [place], [place] [address]    Report Status PENDING  Incomplete  Blood culture (single)     Status: None (Preliminary result)   Collection Time: 03/26/21 12:15 AM   Specimen: BLOOD  Result Value Ref Range Status   Specimen Description BLOOD RIGHT HAND  Final   Special Requests   Final    BOTTLES DRAWN AEROBIC AND ANAEROBIC Blood Culture adequate volume   Culture   Final    NO GROWTH 2 DAYS Performed at [facility], [address], [place], [place] [address]    Report Status PENDING  Incomplete         Radiology Studies: ECHOCARDIOGRAM COMPLETE  Result Date: 03/26/2021    ECHOCARDIOGRAM REPORT   Patient Name:   [name] [name] Date of Exam: 03/26/2021 Medical Rec #:  [id number]      Height:       67.0 in Accession #:    [id number]     Weight:       180.0 lb Date of Birth:  1961/02/14       BSA:          1.934 m Patient Age:    71 years       BP:           110/87 mmHg Patient Gender: M              HR:           101 bpm. Exam Location:  [facility] Procedure: 2D Echo, Cardiac Doppler and  Color Doppler Indications:     Shock  History:         Patient has prior history of Echocardiogram examinations, most                  recent 06/17/2020. CAD. Ischemic cardiomyopathy, Persistent Afib.  Sonographer:     Sherrie Sport Referring Phys:  [id number] Bradly Bienenstock Diagnosing Phys: Nelva Bush MD IMPRESSIONS  1. Left ventricular ejection fraction, by estimation, is 20 to 25%. The left ventricle has severely decreased function. The left ventricle demonstrates global hypokinesis. The left ventricular internal cavity size was severely dilated. Left ventricular diastolic function could not be evaluated.  2. Right ventricular systolic function is severely reduced.  The right ventricular size is mildly enlarged. There is severely elevated pulmonary artery systolic pressure.  3. Left atrial size was mildly dilated.  4. Right atrial size was severely dilated.  5. The mitral valve is abnormal. Moderate mitral valve regurgitation.  6. Tricuspid valve regurgitation is moderate to severe.  7. The aortic valve has an indeterminant number of cusps. There is moderate calcification of the aortic valve. There is moderate thickening of the aortic valve. Aortic valve regurgitation is not visualized. There appears to be mild aortic stenosis, with  LVOT gradient underestimated due to low LVEF.  8. The inferior vena cava is normal in size with <50% respiratory variability, suggesting right atrial pressure of 8 mmHg. FINDINGS  Left Ventricle: Left ventricular ejection fraction, by estimation, is 20 to 25%. The left ventricle has severely decreased function. The left ventricle demonstrates global hypokinesis. The left ventricular internal cavity size was severely dilated. There is no left ventricular hypertrophy. Left ventricular diastolic function could not be evaluated due to atrial fibrillation. Left ventricular diastolic function could not be evaluated. Right Ventricle: The right ventricular size is mildly enlarged. No increase in right ventricular wall thickness. Right ventricular systolic function is severely reduced. There is severely elevated pulmonary artery systolic pressure. The tricuspid regurgitant velocity is 3.94 m/s, and with an assumed right atrial pressure of 8 mmHg, the estimated right ventricular systolic pressure is [id number] mmHg. Left Atrium: Left atrial size was mildly dilated. Right Atrium: Right atrial size was severely dilated. Pericardium: There is no evidence of pericardial effusion. Mitral Valve: The mitral valve is abnormal. Mild mitral annular calcification. Moderate mitral valve regurgitation. Tricuspid Valve: The tricuspid valve is not well visualized. Tricuspid  valve regurgitation is moderate to severe. Aortic Valve: The aortic valve has an indeterminant number of cusps. There is moderate calcification of the aortic valve. There is moderate thickening of the aortic valve. Aortic valve regurgitation is not visualized. There appears to be mild aortic stenosis, with LVOT gradient underestimated due to low LVEF. Aortic valve mean gradient measures 3.7 mmHg. Aortic valve peak gradient measures 6.7 mmHg. Aortic valve area, by VTI measures 1.48 cm. Pulmonic Valve: The pulmonic valve was not well visualized. Pulmonic valve regurgitation is not visualized. No evidence of pulmonic stenosis. Aorta: The aortic root is normal in size and structure. Pulmonary Artery: The pulmonary artery is not well seen. Venous: The inferior vena cava is normal in size with less than 50% respiratory variability, suggesting right atrial pressure of 8 mmHg. IAS/Shunts: No atrial level shunt detected by color flow Doppler.  LEFT VENTRICLE PLAX 2D LVIDd:         6.44 cm LVIDs:         5.72 cm LV PW:         0.98 cm LV  IVS:        0.81 cm LVOT diam:     2.10 cm LV SV:         29 LV SV Index:   15 LVOT Area:     3.46 cm  LV Volumes (MOD) LV vol d, MOD A2C: 201.0 ml LV vol d, MOD A4C: 201.0 ml LV vol s, MOD A2C: 196.0 ml LV vol s, MOD A4C: 174.0 ml LV SV MOD A2C:     5.0 ml LV SV MOD A4C:     201.0 ml LV SV MOD BP:      15.8 ml RIGHT VENTRICLE RV Basal diam:  4.99 cm RV S prime:     8.27 cm/s TAPSE (M-mode): 0.8 cm LEFT ATRIUM             Index        RIGHT ATRIUM           Index LA diam:        4.30 cm 2.22 cm/m   RA Area:     32.50 cm LA Vol (A2C):   90.5 ml 46.80 ml/m  RA Volume:   128.00 ml 66.19 ml/m LA Vol (A4C):   75.3 ml 38.94 ml/m LA Biplane Vol: 87.9 ml 45.46 ml/m  AORTIC VALVE                    PULMONIC VALVE AV Area (Vmax):    1.15 cm     PV Vmax:        0.62 m/s AV Area (Vmean):   1.10 cm     PV Peak grad:   1.5 mmHg AV Area (VTI):     1.48 cm     RVOT Peak grad: 2 mmHg AV Vmax:            129.77 cm/s AV Vmean:          85.467 cm/s AV VTI:            0.196 m AV Peak Grad:      6.7 mmHg AV Mean Grad:      3.7 mmHg LVOT Vmax:         43.10 cm/s LVOT Vmean:        27.100 cm/s LVOT VTI:          0.084 m LVOT/AV VTI ratio: 0.43  AORTA Ao Root diam: 3.30 cm MITRAL VALVE                TRICUSPID VALVE MV Area (PHT): 4.63 cm     TR Peak grad:   62.1 mmHg MV Decel Time: 164 msec     TR Vmax:        394.00 cm/s MV E velocity: 107.00 cm/s                             SHUNTS                             Systemic VTI:  0.08 m                             Systemic Diam: 2.10 cm Nelva Bush MD Electronically signed by Nelva Bush MD Signature Date/Time: 03/26/2021/4:16:21 PM    Final     Scheduled Meds:  apixaban  5 mg Oral BID   atorvastatin  80 mg Oral Daily   Chlorhexidine Gluconate Cloth  6 each Topical Daily   clopidogrel  75 mg Oral Daily   digoxin  0.125 mg Oral Daily   DULoxetine  60 mg Oral Daily   ezetimibe  10 mg Oral Daily   fluticasone furoate-vilanterol  1 puff Inhalation Daily   And   umeclidinium bromide  1 puff Inhalation Daily   furosemide  80 mg Intravenous BID   gabapentin  800 mg Oral QID   insulin aspart  0-15 Units Subcutaneous TID AC & HS   ipratropium-albuterol  3 mL Nebulization Q6H   midodrine  2.5 mg Oral TID WC   nicotine  21 mg Transdermal Daily   pantoprazole  40 mg Oral QHS   tamsulosin  0.4 mg Oral QPC supper   traZODone  150 mg Oral QHS   Continuous Infusions:  sodium chloride Stopped (03/27/21 0644)   norepinephrine (LEVOPHED) Adult infusion Stopped (03/26/21 2117)     LOS: 2 days    Time spent: 35 mins    Dainel Arcidiacono, MD [facility]   If 7PM-7AM, please contact night-coverage

## 2021-03-28 NOTE — Progress Notes (Signed)
Progress Note  Patient Name: [name] [name] Date of Encounter: 03/28/2021  [facility] Cardiologist: Ida Rogue, MD   Subjective   Shortness of breath improved, denies chest pain, edema much better.  Inpatient Medications    Scheduled Meds:  apixaban  5 mg Oral BID   atorvastatin  80 mg Oral Daily   Chlorhexidine Gluconate Cloth  6 each Topical Daily   clopidogrel  75 mg Oral Daily   digoxin  0.125 mg Oral Daily   DULoxetine  60 mg Oral Daily   ezetimibe  10 mg Oral Daily   fluticasone furoate-vilanterol  1 puff Inhalation Daily   And   umeclidinium bromide  1 puff Inhalation Daily   furosemide  80 mg Intravenous BID   gabapentin  800 mg Oral QID   insulin aspart  0-15 Units Subcutaneous TID AC & HS   ipratropium-albuterol  3 mL Nebulization Q6H   midodrine  2.5 mg Oral TID WC   nicotine  21 mg Transdermal Daily   pantoprazole  40 mg Oral QHS   tamsulosin  0.4 mg Oral QPC supper   traZODone  150 mg Oral QHS   Continuous Infusions:  sodium chloride Stopped (03/27/21 0644)   norepinephrine (LEVOPHED) Adult infusion Stopped (03/26/21 2117)   PRN Meds: acetaminophen, docusate sodium, ondansetron (ZOFRAN) IV, oxyCODONE-acetaminophen, polyethylene glycol   Vital Signs    Vitals:   03/28/21 0735 03/28/21 0747 03/28/21 0811 03/28/21 1101  BP:   98/84 126/86  Pulse:   87 100  Resp:   18 18  Temp:   98.6 F (37 C) 98 F (36.7 C)  TempSrc:   Oral Oral  SpO2: 99% 95% 91% 95%  Weight:      Height:        Intake/Output Summary (Last 24 hours) at 03/28/2021 1438 Last data filed at 03/28/2021 1422 Gross per 24 hour  Intake 920 ml  Output 3650 ml  Net -2730 ml   Last 3 Weights 03/28/2021 03/27/2021 03/26/2021  Weight (lbs) 192 lb 3.9 oz 203 lb 0.7 oz 192 lb 3.9 oz  Weight (kg) 87.2 kg 92.1 kg 87.2 kg  Some encounter information is confidential and restricted. Go to Review Flowsheets activity to see all data.      Telemetry    Atrial fibrillation,  heart rate 105- Personally Reviewed  ECG     - Personally Reviewed  Physical Exam  Cyst GEN: No acute distress.   Neck: No JVD Cardiac: Irregular irregular Respiratory: Diminished breath sounds at bases GI: Soft, nontender, non-distended  MS: No edema; No deformity. Neuro:  Nonfocal  Psych: Normal affect   Labs    High Sensitivity Troponin:   Recent Labs  Lab 03/18/21 1110 03/18/21 1317 03/25/21 2005 03/26/21 0015  TROPONINIHS 73* 68* 51* 50*     Chemistry Recent Labs  Lab 03/25/21 2005 03/26/21 0517 03/26/21 1203 03/27/21 0600 03/28/21 0515  NA 131* 133*  --  136 137  K 4.5 5.2* 4.6 3.9 4.3  CL 90* 93*  --  94* 96*  CO2 30 29  --  32 31  GLUCOSE 113* 126*  --  91 117*  BUN 39* 35*  --  27* 29*  CREATININE 1.40* 1.30*  --  0.95 1.12  CALCIUM 9.0 8.9  --  9.0 9.1  MG  --  1.9  --  1.9  --   PROT 6.9  --   --   --   --   ALBUMIN 3.9  --   --   --   --  AST 34  --   --   --   --   ALT 23  --   --   --   --   ALKPHOS 91  --   --   --   --   BILITOT 1.5*  --   --   --   --   GFRNONAA 58* >60  --  >60 >60  ANIONGAP 11 11  --  10 10    Lipids No results for input(s): CHOL, TRIG, HDL, LABVLDL, LDLCALC, CHOLHDL in the last 168 hours.  Hematology Recent Labs  Lab 03/25/21 2005 03/26/21 0517 03/27/21 0600  WBC 7.4 11.1* 6.9  RBC 3.87* 4.21* 3.91*  HGB 10.6* 11.6* 10.9*  HCT 31.6* 34.9* 31.7*  MCV 81.7 82.9 81.1  MCH 27.4 27.6 27.9  MCHC 33.5 33.2 34.4  RDW 21.7* 21.5* 21.4*  PLT 154 186 120*   Thyroid No results for input(s): TSH, FREET4 in the last 168 hours.  BNP Recent Labs  Lab 03/25/21 2005  BNP 1,115.4*    DDimer No results for input(s): DDIMER in the last 168 hours.   Radiology    No results found.  Cardiac Studies   Echo 03/2021, EF 20 to 25%  Patient Profile     60 y.o. male with history of CAD, COPD, hypertension, atrial fibrillation presenting with shortness of breath.  Diagnosed with hypertension and volume  overload.  Assessment & Plan    HFrEF, EF 20 to 25% -On IV Lasix 80 twice daily, net -1.4 L. -Decrease midodrine to 2.5 mg 3 times daily, hopefully we can stop midodrine tomorrow. -We will reduce Lasix to 40 mg twice daily for BP support. -Monitor creatinine -Restart GDMT (BB, ACE, ARB) when BP permits off midodrine.  2.  Permanent A. Fib -Digoxin, Eliquis.  3.  CAD -Denies chest pain -Aspirin, Plavix, Lipitor.  Total encounter time 35 minutes  Greater than 50% was spent in counseling and coordination of care with the patient     Signed, Kate Sable, MD  03/28/2021, 2:38 PM

## 2021-03-29 DIAGNOSIS — E785 Hyperlipidemia, unspecified: Secondary | ICD-10-CM

## 2021-03-29 DIAGNOSIS — E1169 Type 2 diabetes mellitus with other specified complication: Secondary | ICD-10-CM

## 2021-03-29 DIAGNOSIS — I482 Chronic atrial fibrillation, unspecified: Secondary | ICD-10-CM

## 2021-03-29 DIAGNOSIS — J432 Centrilobular emphysema: Secondary | ICD-10-CM

## 2021-03-29 DIAGNOSIS — N1831 Chronic kidney disease, stage 3a: Secondary | ICD-10-CM

## 2021-03-29 DIAGNOSIS — I739 Peripheral vascular disease, unspecified: Secondary | ICD-10-CM

## 2021-03-29 DIAGNOSIS — I5023 Acute on chronic systolic (congestive) heart failure: Secondary | ICD-10-CM | POA: Diagnosis not present

## 2021-03-29 DIAGNOSIS — I25118 Atherosclerotic heart disease of native coronary artery with other forms of angina pectoris: Secondary | ICD-10-CM

## 2021-03-29 DIAGNOSIS — Z720 Tobacco use: Secondary | ICD-10-CM

## 2021-03-29 LAB — GLUCOSE, CAPILLARY
Glucose-Capillary: 133 mg/dL — ABNORMAL HIGH (ref 70–99)
Glucose-Capillary: 150 mg/dL — ABNORMAL HIGH (ref 70–99)
Glucose-Capillary: 129 mg/dL — ABNORMAL HIGH (ref 70–99)
Glucose-Capillary: 117 mg/dL — ABNORMAL HIGH (ref 70–99)

## 2021-03-29 LAB — BASIC METABOLIC PANEL
Anion gap: 8 (ref 5–15)
BUN: 34 mg/dL — ABNORMAL HIGH (ref 6–20)
CO2: 33 mmol/L — ABNORMAL HIGH (ref 22–32)
Calcium: 9.2 mg/dL (ref 8.9–10.3)
Chloride: 91 mmol/L — ABNORMAL LOW (ref 98–111)
Creatinine, Ser: 1.07 mg/dL (ref 0.61–1.24)
GFR, Estimated: >60 mL/min (ref >60)
Glucose, Bld: 102 mg/dL — ABNORMAL HIGH (ref 70–99)
Potassium: 4.3 mmol/L (ref 3.5–5.1)
Sodium: 132 mmol/L — ABNORMAL LOW (ref 135–145)

## 2021-03-29 LAB — CBC
HCT: 32.8 % — ABNORMAL LOW (ref 39.0–52.0)
Hemoglobin: 10.9 g/dL — ABNORMAL LOW (ref 13.0–17.0)
MCH: 27 pg (ref 26.0–34.0)
MCHC: 33.2 g/dL (ref 30.0–36.0)
MCV: 81.2 fL (ref 80.0–100.0)
Platelets: 136 10*3/uL — ABNORMAL LOW (ref 150–400)
RBC: 4.04 MIL/uL — ABNORMAL LOW (ref 4.22–5.81)
RDW: 21.1 % — ABNORMAL HIGH (ref 11.5–15.5)
WBC: 5 10*3/uL (ref 4.0–10.5)
nRBC: 0 % (ref 0.0–0.2)

## 2021-03-29 LAB — PHOSPHORUS: Phosphorus: 3.6 mg/dL (ref 2.5–4.6)

## 2021-03-29 LAB — MAGNESIUM: Magnesium: 2 mg/dL (ref 1.7–2.4)

## 2021-03-29 MED ORDER — ISOSORBIDE MONONITRATE ER 30 MG PO TB24
30.0000 mg | ORAL_TABLET | Freq: Every day | ORAL | Status: DC
  Administered 2021-03-30: 30 mg via ORAL
  Filled 2021-03-29: qty 1

## 2021-03-29 NOTE — Care Management Important Message (Signed)
Important Message  Patient Details  Name: TAURUS MAJKUT MRN: [id number] Date of Birth: January 16, 1961   Medicare Important Message Given:  Yes     [name] 03/29/2021, 2:37 PM

## 2021-03-29 NOTE — Progress Notes (Signed)
PROGRESS NOTE    [name] [name]  [id number] DOB: [birth year] DOA: 03/25/2021 PCP: [name], MD    Brief Narrative:  This 60 years old male with PMH significant for HFrEF, COPD, CKD stage IIIa, PAD, BPH, OSA, HTN, HLD, Persistent A. fib, type 2 diabetes, tobacco use presented in the ED after he developed acute onset of generalized fatigue along with chest pressure and shortness of breath while mowing his lawn. Patient was hypotensive with severe shortness of breath on arrival , requiring Levophed and high oxygen concentrations. CT angiogram no evidence of dissection,  effusion or PE. Patient was admitted in the ICU for cardiogenic shock.  Repeat echocardiogram showed EF 20 to 25%,  unable to evaluate diastolic parameters.  Blood pressure has improved.  Patient is off Levophed, but remains on midodrine,   leukocytosis resolved.  Cardiology consulted for A. Fib/ CHF recommended,  aggressive diuresis. PCCM pickup 03/28/2021.   Assessment & Plan:   Principal Problem:   Acute on chronic HFrEF (heart failure with reduced ejection fraction) (HCC) Active Problems:   PAD (peripheral artery disease) (HCC)   HLD (hyperlipidemia)   Tobacco use   Type 2 diabetes mellitus with hyperlipidemia (HCC)   COPD (chronic obstructive pulmonary disease) (HCC)   Chronic a-fib (HCC)   BPH (benign prostatic hyperplasia)   Stage 3a chronic kidney disease (CKD) (HCC)   Cardiogenic shock (HCC)   Permanent atrial fibrillation (HCC)  Cardiogenic shock sec. to acute on chronic HFrEF: Patient was severely hypotensive requiring Levophed support on arrival. Blood pressure has improved, He is weaned off of Levophed. Labs not indicative of any active infection. Lactic acid 1.4> 1.1.,  Procalcitonin negative x 2. Patient has recently completed antibiotics course for LLE cellulitis PTA. BNP 1115, still appears volume overloaded. Repeat Echo : LVEF 20 to 25%, unable to evaluate diastolic parameters. Cardiology  is following,  recommended diuresis as BP and renal functions permits. Continue Lasix 40 mg IV twice daily.  Continue digoxin 125 mcg daily. Cardiogenic shock has resolved. CHF symptoms improving Plan to discontinue midodrine, as this will increase afterload. Restart GDMT(BB, ACE, ARB) when BP permits off midodrine.  Permanent atrial fibrillation: Heart rate well controlled,  BP soft. Continue Eliquis 5 mg twice daily. Will resume low-dose beta-blocker.  Coronary artery disease/demand ischemia: Continue Plavix, Lipitor, and Eliquis in the setting of A. Fib No plans for coronary angiography at this admission unless there is any signs of worsening ischemia Mild troponin elevation most consistent with demand ischemia  COPD without acute exacerbation: Continue supplemental oxygen to maintain SPO2 above 88% Continue home inhalers. Continue scheduled duo nebs.  CKD stage IIIa: Avoid nephrotoxic medications,  renal functions back to baseline.  Hyperkalemia / Hyponatremia: Resolved.  Type 2 diabetes with neuropathy: Continue sliding scale. Continue gabapentin  PAD :  Continue Plavix and statin  Tobacco abuse : smoking cessation counseling completed.   DVT prophylaxis: Eliquis Code Status: Full code Family Communication: No family at bedside Disposition Plan:    Status is: Inpatient  Remains inpatient appropriate because: Needs aggressive diuresis for CHF exacerbation  Anticipated discharged home in 1 to 2 days.   Consultants:  Cardiology PCCM  Procedures: CTA chest, echocardiogram Antimicrobials:    Anti-infectives (From admission, onward)    Start     Dose/Rate Route Frequency Ordered Stop   03/26/21 0015  vancomycin (VANCOREADY) IVPB 2000 mg/400 mL        2,000 mg 200 mL/hr over 120 Minutes Intravenous  Once 03/26/21 0004 03/26/21  0350   03/25/21 2345  cefTRIAXone (ROCEPHIN) 2 g in sodium chloride 0.9 % 100 mL IVPB        2 g 200 mL/hr over 30 Minutes  Intravenous  Once 03/25/21 2333 03/26/21 0059        Subjective: Patient was seen and examined at bedside.  Overnight events noted.   Patient reports feeling much improved.  He is able to lie flat, BP is still soft. He is getting Lasix 40 mg IV twice daily,  reports very good urine output.  Objective: Vitals:   03/29/21 0458 03/29/21 0500 03/29/21 0516 03/29/21 0717  BP: 118/81   (!) 122/93  Pulse: 80   92  Resp: 18 15    Temp: 97.7 F (36.5 C)   97.6 F (36.4 C)  TempSrc: Oral   Oral  SpO2: (!) 88% 94%  95%  Weight:   85.6 kg   Height:        Intake/Output Summary (Last 24 hours) at 03/29/2021 1056 Last data filed at 03/29/2021 1017 Gross per 24 hour  Intake 2160 ml  Output 4450 ml  Net -2290 ml   Filed Weights   03/27/21 0500 03/28/21 0600 03/29/21 0516  Weight: 92.1 kg 87.2 kg 85.6 kg    Examination:  General exam: Appears comfortable, not in any acute distress. Respiratory system: Bibasilar crackles+, respiratory effort normal, RR 15. Cardiovascular system: S1-S2 heard, irregular rhythm, no murmur.   Gastrointestinal system: Abdomen is soft, nontender, nondistended, BS +. Central nervous system: Alert and oriented x 3. No focal neurological deficits. Extremities: Right BKA, 1+ pedal edema in left lower extremity. Skin: No rashes, lesions or ulcers Psychiatry: Judgement and insight appear normal. Mood & affect appropriate.     Data Reviewed: I have personally reviewed following labs and imaging studies  CBC: Recent Labs  Lab 03/25/21 2005 03/26/21 0517 03/27/21 0600 03/29/21 0409  WBC 7.4 11.1* 6.9 5.0  NEUTROABS 6.0  --   --   --   HGB 10.6* 11.6* 10.9* 10.9*  HCT 31.6* 34.9* 31.7* 32.8*  MCV 81.7 82.9 81.1 81.2  PLT 154 186 120* [id number]*   Basic Metabolic Panel: Recent Labs  Lab 03/25/21 2005 03/26/21 0517 03/26/21 1203 03/27/21 0600 03/28/21 0515 03/29/21 0409  NA 131* 133*  --  136 137 132*  K 4.5 5.2* 4.6 3.9 4.3 4.3  CL 90* 93*  --   94* 96* 91*  CO2 30 29  --  32 31 33*  GLUCOSE 113* 126*  --  91 117* 102*  BUN 39* 35*  --  27* 29* 34*  CREATININE 1.40* 1.30*  --  0.95 1.12 1.07  CALCIUM 9.0 8.9  --  9.0 9.1 9.2  MG  --  1.9  --  1.9  --  2.0  PHOS  --  4.5  --  3.6  --  3.6   GFR: Estimated Creatinine Clearance: 78.7 mL/min (by C-G formula based on SCr of 1.07 mg/dL). Liver Function Tests: Recent Labs  Lab 03/25/21 2005  AST 34  ALT 23  ALKPHOS 91  BILITOT 1.5*  PROT 6.9  ALBUMIN 3.9   No results for input(s): LIPASE, AMYLASE in the last 168 hours. No results for input(s): AMMONIA in the last 168 hours. Coagulation Profile: No results for input(s): INR, PROTIME in the last 168 hours. Cardiac Enzymes: No results for input(s): CKTOTAL, CKMB, CKMBINDEX, TROPONINI in the last 168 hours. BNP (last 3 results) No results for input(s): PROBNP in the  last 8760 hours. HbA1C: No results for input(s): HGBA1C in the last 72 hours. CBG: Recent Labs  Lab 03/28/21 0813 03/28/21 1104 03/28/21 1538 03/28/21 2013 03/29/21 0718  GLUCAP 126* 226* 107* 142* 133*   Lipid Profile: No results for input(s): CHOL, HDL, LDLCALC, TRIG, CHOLHDL, LDLDIRECT in the last 72 hours. Thyroid Function Tests: No results for input(s): TSH, T4TOTAL, FREET4, T3FREE, THYROIDAB in the last 72 hours. Anemia Panel: No results for input(s): VITAMINB12, FOLATE, FERRITIN, TIBC, IRON, RETICCTPCT in the last 72 hours. Sepsis Labs: Recent Labs  Lab 03/25/21 2005 03/25/21 2158 03/26/21 0015 03/27/21 0600 03/28/21 0515  PROCALCITON <0.10  --   --  <0.10 <0.10  LATICACIDVEN  --  1.4 1.1  --   --     Recent Results (from the past 240 hour(s))  Resp Panel by RT-PCR (Flu A&B, Covid) Nasopharyngeal Swab     Status: None   Collection Time: 03/25/21  8:05 PM   Specimen: Nasopharyngeal Swab; Nasopharyngeal(NP) swabs in vial transport medium  Result Value Ref Range Status   SARS Coronavirus 2 by RT PCR NEGATIVE NEGATIVE Final    Comment:  (NOTE) SARS-CoV-2 target nucleic acids are NOT DETECTED.  The SARS-CoV-2 RNA is generally detectable in upper respiratory specimens during the acute phase of infection. The lowest concentration of SARS-CoV-2 viral copies this assay can detect is 138 copies/mL. A negative result does not preclude SARS-Cov-2 infection and should not be used as the sole basis for treatment or other patient management decisions. A negative result may occur with  improper specimen collection/handling, submission of specimen other than nasopharyngeal swab, presence of viral mutation(s) within the areas targeted by this assay, and inadequate number of viral copies(<138 copies/mL). A negative result must be combined with clinical observations, patient history, and epidemiological information. The expected result is Negative.  Fact Sheet for Patients:  EntrepreneurPulse.com.au  Fact Sheet for Healthcare Providers:  IncredibleEmployment.be  This test is no t yet approved or cleared by the [place] FDA and  has been authorized for detection and/or diagnosis of SARS-CoV-2 by FDA under an Emergency Use Authorization (EUA). This EUA will remain  in effect (meaning this test can be used) for the duration of the COVID-19 declaration under Section 564(b)(1) of the Act, 21 U.S.C.section 360bbb-3(b)(1), unless the authorization is terminated  or revoked sooner.       Influenza A by PCR NEGATIVE NEGATIVE Final   Influenza B by PCR NEGATIVE NEGATIVE Final    Comment: (NOTE) The Xpert Xpress SARS-CoV-2/FLU/RSV plus assay is intended as an aid in the diagnosis of influenza from Nasopharyngeal swab specimens and should not be used as a sole basis for treatment. Nasal washings and aspirates are unacceptable for Xpert Xpress SARS-CoV-2/FLU/RSV testing.  Fact Sheet for Patients: EntrepreneurPulse.com.au  Fact Sheet for Healthcare  Providers: IncredibleEmployment.be  This test is not yet approved or cleared by the [place] FDA and has been authorized for detection and/or diagnosis of SARS-CoV-2 by FDA under an Emergency Use Authorization (EUA). This EUA will remain in effect (meaning this test can be used) for the duration of the COVID-19 declaration under Section 564(b)(1) of the Act, 21 U.S.C. section 360bbb-3(b)(1), unless the authorization is terminated or revoked.  Performed at [facility], [address] [place], [place] [address]   Blood culture (single)     Status: None (Preliminary result)   Collection Time: 03/25/21 10:00 PM   Specimen: BLOOD  Result Value Ref Range Status   Specimen Description BLOOD RIGHT  ANTECUBITAL  Final   Special Requests   Final    BOTTLES DRAWN AEROBIC AND ANAEROBIC Blood Culture results may not be optimal due to an excessive volume of blood received in culture bottles   Culture   Final    NO GROWTH 4 DAYS Performed at [facility], [address], [place], [place] [address]    Report Status PENDING  Incomplete  Blood culture (single)     Status: None (Preliminary result)   Collection Time: 03/26/21 12:15 AM   Specimen: BLOOD  Result Value Ref Range Status   Specimen Description BLOOD RIGHT HAND  Final   Special Requests   Final    BOTTLES DRAWN AEROBIC AND ANAEROBIC Blood Culture adequate volume   Culture   Final    NO GROWTH 3 DAYS Performed at [facility], [address], [place],  [address]    Report Status PENDING  Incomplete         Radiology Studies: No results found.  Scheduled Meds:  apixaban  5 mg Oral BID   atorvastatin  80 mg Oral Daily   clopidogrel  75 mg Oral Daily   digoxin  0.125 mg Oral Daily   DULoxetine  60 mg Oral Daily   ezetimibe  10 mg Oral Daily   fluticasone furoate-vilanterol  1 puff Inhalation Daily   And   umeclidinium bromide  1 puff Inhalation Daily    furosemide  40 mg Intravenous BID   gabapentin  800 mg Oral QID   insulin aspart  0-15 Units Subcutaneous TID AC & HS   ipratropium-albuterol  3 mL Nebulization Q6H   midodrine  2.5 mg Oral TID WC   nicotine  21 mg Transdermal Daily   pantoprazole  40 mg Oral QHS   tamsulosin  0.4 mg Oral QPC supper   traZODone  150 mg Oral QHS   Continuous Infusions:  sodium chloride Stopped (03/27/21 0644)     LOS: 3 days    Time spent: 25 mins    Cosmo Tetreault, MD [facility]   If 7PM-7AM, please contact night-coverage

## 2021-03-29 NOTE — Progress Notes (Signed)
PHARMACY CONSULT NOTE - FOLLOW UP  Pharmacy Consult for Electrolyte Monitoring and Replacement   Recent Labs: Potassium (mmol/L)  Date Value  03/29/2021 4.3  06/08/2014 4.3   Magnesium (mg/dL)  Date Value  03/29/2021 2.0  02/06/2014 1.4 (L)   Calcium (mg/dL)  Date Value  03/29/2021 9.2   Calcium, Total (mg/dL)  Date Value  06/08/2014 9.2   Albumin (g/dL)  Date Value  03/25/2021 3.9  01/29/2018 3.3 (L)  03/07/2014 3.9   Phosphorus (mg/dL)  Date Value  03/29/2021 3.6   Sodium (mmol/L)  Date Value  03/29/2021 132 (L)  01/25/2021 140  06/08/2014 136     Assessment: 60 year old male presented with fatigue and chest pressure, SOB.Patient was transferred form ICU to telemetry on 03/27/21. He is receiving diuresis. Pharmacy consult for electrolytes.  Sodium slightly below goal but stable. All other electrolytes remain within normal limits.  Goal of Therapy:  Electrolytes WNL   Plan:  No need for electrolytes replacement today Will continue to monitor morning labs and replace electrolytes as needed.   Samon Dishner Rodriguez-Guzman PharmD, BCPS 03/29/2021 9:18 AM

## 2021-03-29 NOTE — Progress Notes (Signed)
Progress Note  Patient Name: [name] [name] Date of Encounter: 03/29/2021  [facility] Cardiologist: Ida Rogue, MD   Subjective   Presenting to the emergency room October 16 with hypoxia saturations 81 to 84%, weakness Was mowing the grass day of admission, since then with fatigue, headache, then developed chest discomfort, shortness of breath, abdomen swollen distended, constipation from pain medication, leg more swollen --Treated with aggressive diuresis this admission, close to 9 L negative  Lethargic today, no complaints Still feels fluid in his abdomen, little bit in the left leg Feels we are making progress  Inpatient Medications    Scheduled Meds:  apixaban  5 mg Oral BID   atorvastatin  80 mg Oral Daily   clopidogrel  75 mg Oral Daily   digoxin  0.125 mg Oral Daily   DULoxetine  60 mg Oral Daily   ezetimibe  10 mg Oral Daily   fluticasone furoate-vilanterol  1 puff Inhalation Daily   And   umeclidinium bromide  1 puff Inhalation Daily   furosemide  40 mg Intravenous BID   gabapentin  800 mg Oral QID   insulin aspart  0-15 Units Subcutaneous TID AC & HS   ipratropium-albuterol  3 mL Nebulization Q6H   midodrine  2.5 mg Oral TID WC   nicotine  21 mg Transdermal Daily   pantoprazole  40 mg Oral QHS   tamsulosin  0.4 mg Oral QPC supper   traZODone  150 mg Oral QHS   Continuous Infusions:  sodium chloride Stopped (03/27/21 0644)   PRN Meds: acetaminophen, alum & mag hydroxide-simeth, docusate sodium, guaiFENesin, ondansetron (ZOFRAN) IV, oxyCODONE-acetaminophen, polyethylene glycol   Vital Signs    Vitals:   03/29/21 0500 03/29/21 0516 03/29/21 0717 03/29/21 1119  BP:   (!) 122/93 120/75  Pulse:   92 87  Resp: 15     Temp:   97.6 F (36.4 C) 97.8 F (36.6 C)  TempSrc:   Oral Oral  SpO2: 94%  95% 97%  Weight:  85.6 kg    Height:        Intake/Output Summary (Last 24 hours) at 03/29/2021 1332 Last data filed at 03/29/2021 1149 Gross per 24  hour  Intake 2160 ml  Output 4450 ml  Net -2290 ml   Last 3 Weights 03/29/2021 03/28/2021 03/27/2021  Weight (lbs) 188 lb 12.8 oz 192 lb 3.9 oz 203 lb 0.7 oz  Weight (kg) 85.639 kg 87.2 kg 92.1 kg  Some encounter information is confidential and restricted. Go to Review Flowsheets activity to see all data.      Telemetry    Atrial fibrillation PVCs, bundle branch block- Personally Reviewed  ECG    - Personally Reviewed  Physical Exam   GEN: No acute distress.  Lethargic Neck:  JVD 10+ Cardiac: RRR, no murmurs, rubs, or gallops.  Respiratory: Moderate decreased breath sounds, some dullness at the bases GI: Soft, nontender, non-distended  MS: Trace to 1+ pitting left lower extremity edema, woody, amputation of the right Neuro:  Nonfocal  Psych: Normal affect   Labs    High Sensitivity Troponin:   Recent Labs  Lab 03/18/21 1110 03/18/21 1317 03/25/21 2005 03/26/21 0015  TROPONINIHS 73* 68* 51* 50*     Chemistry Recent Labs  Lab 03/25/21 2005 03/26/21 0517 03/26/21 1203 03/27/21 0600 03/28/21 0515 03/29/21 0409  NA 131* 133*  --  136 137 132*  K 4.5 5.2*   < > 3.9 4.3 4.3  CL 90* 93*  --  94* 96* 91*  CO2 30 29  --  32 31 33*  GLUCOSE 113* 126*  --  91 117* 102*  BUN 39* 35*  --  27* 29* 34*  CREATININE 1.40* 1.30*  --  0.95 1.12 1.07  CALCIUM 9.0 8.9  --  9.0 9.1 9.2  MG  --  1.9  --  1.9  --  2.0  PROT 6.9  --   --   --   --   --   ALBUMIN 3.9  --   --   --   --   --   AST 34  --   --   --   --   --   ALT 23  --   --   --   --   --   ALKPHOS 91  --   --   --   --   --   BILITOT 1.5*  --   --   --   --   --   GFRNONAA 58* >60  --  >60 >60 >60  ANIONGAP 11 11  --  '10 10 8   '$ < > = values in this interval not displayed.    Lipids No results for input(s): CHOL, TRIG, HDL, LABVLDL, LDLCALC, CHOLHDL in the last 168 hours.  Hematology Recent Labs  Lab 03/26/21 0517 03/27/21 0600 03/29/21 0409  WBC 11.1* 6.9 5.0  RBC 4.21* 3.91* 4.04*  HGB 11.6* 10.9*  10.9*  HCT 34.9* 31.7* 32.8*  MCV 82.9 81.1 81.2  MCH 27.6 27.9 27.0  MCHC 33.2 34.4 33.2  RDW 21.5* 21.4* 21.1*  PLT 186 120* 136*   Thyroid No results for input(s): TSH, FREET4 in the last 168 hours.  BNP Recent Labs  Lab 03/25/21 2005  BNP 1,115.4*    DDimer No results for input(s): DDIMER in the last 168 hours.   Radiology    No results found.  Cardiac Studies  Echo  1. Left ventricular ejection fraction, by estimation, is 20 to 25%. The  left ventricle has severely decreased function. The left ventricle  demonstrates global hypokinesis. The left ventricular internal cavity size  was severely dilated. Left ventricular  diastolic function could not be evaluated.   2. Right ventricular systolic function is severely reduced. The right  ventricular size is mildly enlarged. There is severely elevated pulmonary  artery systolic pressure.   3. Left atrial size was mildly dilated.   4. Right atrial size was severely dilated.   5. The mitral valve is abnormal. Moderate mitral valve regurgitation.   6. Tricuspid valve regurgitation is moderate to severe.   7. The aortic valve has an indeterminant number of cusps. There is  moderate calcification of the aortic valve. There is moderate thickening  of the aortic valve. Aortic valve regurgitation is not visualized. There  appears to be mild aortic stenosis, with   LVOT gradient underestimated due to low LVEF.   8. The inferior vena cava is normal in size with <50% respiratory  variability, suggesting right atrial pressure of 8 mmHg.    Patient Profile     60 y.o. male with history of CAD, ischemic cardiomyopathy, COPD, hypertension, atrial fibrillation presenting with shortness of breath.  Prior history of medication noncompliance, leaving AMA from the hospital, diagnosed with hypertension and volume overload.  Acute on chronic systolic CHF  Assessment & Plan    Acute on chronic diastolic and systolic CHF Concern for possible  sepsis, cardiogenic shock on arrival Post  heart failure medications held, initially on pressors, weaned to midodrine, this is being weaned -On Lasix IV twice daily with good result, slow improvement in his symptoms, abdomen still tight with leg edema, not at his ideal baseline -Challenging case given prior med noncompliance, leaving AMA We have had hospice discussions with him on numerous occasions in the past -For now would continue IV Lasix twice daily, digoxin, Hold midodrine as blood pressures typically running [id number] systolic -On digoxin -Medications he was on as an outpatient that are held are losartan 25 daily, Coreg 3.125 twice daily, isosorbide 30 daily Also on verquvo (suspect he may be on samples) -- For now we will start isosorbide 30 daily tomorrow morning with hold parameters  Coronary artery disease with stable angina Long history of severe coronary disease, previously leaving AMA, some medication noncompliance Currently denies any anginal symptoms, no plan for cardiac catheterization at this time -Aspirin, Plavix, Lipitor  Permanent atrial fibrillation On digoxin, Eliquis After is completed diuresis look to restart his carvedilol 3.125 twice daily  Long discussion with him concerning above  Total encounter time more than 35 minutes  Greater than 50% was spent in counseling and coordination of care with the patient   For questions or updates, please contact [facility] Please consult [email or web address] for contact info under        Signed, Ida Rogue, MD  03/29/2021, 1:32 PM

## 2021-03-30 ENCOUNTER — Encounter: Payer: Self-pay | Admitting: *Deleted

## 2021-03-30 ENCOUNTER — Encounter (INDEPENDENT_AMBULATORY_CARE_PROVIDER_SITE_OTHER): Payer: Medicare Other

## 2021-03-30 ENCOUNTER — Ambulatory Visit (INDEPENDENT_AMBULATORY_CARE_PROVIDER_SITE_OTHER): Payer: Medicare Other | Admitting: Nurse Practitioner

## 2021-03-30 DIAGNOSIS — I5023 Acute on chronic systolic (congestive) heart failure: Secondary | ICD-10-CM | POA: Diagnosis not present

## 2021-03-30 LAB — CULTURE, BLOOD (SINGLE): Culture: NO GROWTH

## 2021-03-30 LAB — BASIC METABOLIC PANEL
Anion gap: 10 (ref 5–15)
BUN: 32 mg/dL — ABNORMAL HIGH (ref 6–20)
CO2: 32 mmol/L (ref 22–32)
Calcium: 9.4 mg/dL (ref 8.9–10.3)
Chloride: 92 mmol/L — ABNORMAL LOW (ref 98–111)
Creatinine, Ser: 1.17 mg/dL (ref 0.61–1.24)
GFR, Estimated: >60 mL/min (ref >60)
Glucose, Bld: 106 mg/dL — ABNORMAL HIGH (ref 70–99)
Potassium: 4 mmol/L (ref 3.5–5.1)
Sodium: 134 mmol/L — ABNORMAL LOW (ref 135–145)

## 2021-03-30 LAB — MAGNESIUM: Magnesium: 2 mg/dL (ref 1.7–2.4)

## 2021-03-30 LAB — GLUCOSE, CAPILLARY
Glucose-Capillary: 110 mg/dL — ABNORMAL HIGH (ref 70–99)
Glucose-Capillary: 135 mg/dL — ABNORMAL HIGH (ref 70–99)

## 2021-03-30 MED ORDER — IPRATROPIUM-ALBUTEROL 0.5-2.5 (3) MG/3ML IN SOLN: 3.0000 mL | RESPIRATORY_TRACT | Status: DC | PRN

## 2021-03-30 MED ORDER — TORSEMIDE 40 MG PO TABS: 40.0000 mg | ORAL_TABLET | Freq: Two times a day (BID) | ORAL | 0 refills | Status: AC

## 2021-03-30 NOTE — Progress Notes (Signed)
PHARMACY CONSULT NOTE - FOLLOW UP  Pharmacy Consult for Electrolyte Monitoring and Replacement   Recent Labs: Potassium (mmol/L)  Date Value  03/30/2021 4.0  06/08/2014 4.3   Magnesium (mg/dL)  Date Value  03/30/2021 2.0  02/06/2014 1.4 (L)   Calcium (mg/dL)  Date Value  03/30/2021 9.4   Calcium, Total (mg/dL)  Date Value  06/08/2014 9.2   Albumin (g/dL)  Date Value  03/25/2021 3.9  01/29/2018 3.3 (L)  03/07/2014 3.9   Phosphorus (mg/dL)  Date Value  03/29/2021 3.6   Sodium (mmol/L)  Date Value  03/30/2021 134 (L)  01/25/2021 140  06/08/2014 136     Assessment: 60 year old male presented with fatigue and chest pressure, SOB.Patient was transferred form ICU to telemetry on 03/27/21. He is receiving diuresis. Pharmacy consult for electrolytes.  Sodium slightly below goal but improving. All other electrolytes remain within normal limits.  Goal of Therapy:  Electrolytes WNL   Plan:  No need for electrolytes replacement today Will continue to monitor morning labs and replace electrolytes as needed.   Yetta Marceaux Rodriguez-Guzman PharmD, BCPS 03/30/2021 9:34 AM

## 2021-03-30 NOTE — Progress Notes (Signed)
Progress Note  Patient Name: [name] [name] Date of Encounter: 03/30/2021  Primary Cardiologist: Ida Rogue, MD  Subjective   Feels that breathing is back to baseline.  Still w/ significant abd bloating, though says that he wants to go home today.  Will likely leave whether or not he's discharged.  No chest pain.  Inpatient Medications    Scheduled Meds:  apixaban  5 mg Oral BID   atorvastatin  80 mg Oral Daily   clopidogrel  75 mg Oral Daily   digoxin  0.125 mg Oral Daily   DULoxetine  60 mg Oral Daily   ezetimibe  10 mg Oral Daily   fluticasone furoate-vilanterol  1 puff Inhalation Daily   And   umeclidinium bromide  1 puff Inhalation Daily   furosemide  40 mg Intravenous BID   gabapentin  800 mg Oral QID   insulin aspart  0-15 Units Subcutaneous TID AC & HS   ipratropium-albuterol  3 mL Nebulization Q6H   isosorbide mononitrate  30 mg Oral Daily   nicotine  21 mg Transdermal Daily   pantoprazole  40 mg Oral QHS   tamsulosin  0.4 mg Oral QPC supper   traZODone  150 mg Oral QHS   Continuous Infusions:  sodium chloride Stopped (03/27/21 0644)   PRN Meds: acetaminophen, alum & mag hydroxide-simeth, docusate sodium, guaiFENesin, ondansetron (ZOFRAN) IV, oxyCODONE-acetaminophen, polyethylene glycol   Vital Signs    Vitals:   03/29/21 2121 03/29/21 2311 03/30/21 0244 03/30/21 0819  BP:  112/78 122/78 (!) 115/92  Pulse:  66 70 88  Resp:  16 18 20   Temp:  (!) 97.5 F (36.4 C) (!) 97.5 F (36.4 C) 97.9 F (36.6 C)  TempSrc:      SpO2: 95% 97% 95% 99%  Weight:   85.2 kg   Height:        Intake/Output Summary (Last 24 hours) at 03/30/2021 0901 Last data filed at 03/30/2021 0315 Gross per 24 hour  Intake 960 ml  Output 3400 ml  Net -2440 ml   Filed Weights   03/28/21 0600 03/29/21 0516 03/30/21 0244  Weight: 87.2 kg 85.6 kg 85.2 kg    Physical Exam   GEN: Well nourished, well developed, in no acute distress.  HEENT: Grossly normal.  Neck: Supple,  mod elev JVP, no carotid bruits, or masses. Cardiac: IR, IR, no murmurs, rubs, or gallops. No clubbing, cyanosis, trace bilat ankle edema.  Radials 2+, DP/PT 1+ and equal bilaterally.  Respiratory:  Respirations regular and unlabored, clear to auscultation bilaterally. GI: Obese, firm, protuberant, nontender, BS + x 4. MS: no deformity or atrophy. Skin: warm and dry, no rash. Neuro:  Strength and sensation are intact. Psych: AAOx3.  Normal affect.  Labs    Chemistry Recent Labs  Lab 03/25/21 2005 03/26/21 0517 03/28/21 0515 03/29/21 0409 03/30/21 0747  NA 131*   < > 137 132* 134*  K 4.5   < > 4.3 4.3 4.0  CL 90*   < > 96* 91* 92*  CO2 30   < > 31 33* 32  GLUCOSE 113*   < > 117* 102* 106*  BUN 39*   < > 29* 34* 32*  CREATININE 1.40*   < > 1.12 1.07 1.17  CALCIUM 9.0   < > 9.1 9.2 9.4  PROT 6.9  --   --   --   --   ALBUMIN 3.9  --   --   --   --  AST 34  --   --   --   --   ALT 23  --   --   --   --   ALKPHOS 91  --   --   --   --   BILITOT 1.5*  --   --   --   --   GFRNONAA 58*   < > >60 >60 >60  ANIONGAP 11   < > 10 8 10    < > = values in this interval not displayed.     Hematology Recent Labs  Lab 03/26/21 0517 03/27/21 0600 03/29/21 0409  WBC 11.1* 6.9 5.0  RBC 4.21* 3.91* 4.04*  HGB 11.6* 10.9* 10.9*  HCT 34.9* 31.7* 32.8*  MCV 82.9 81.1 81.2  MCH 27.6 27.9 27.0  MCHC 33.2 34.4 33.2  RDW 21.5* 21.4* 21.1*  PLT 186 120* 136*    Cardiac Enzymes  Recent Labs  Lab 03/18/21 1110 03/18/21 1317 03/25/21 2005 03/26/21 0015  TROPONINIHS 73* 68* 51* 50*      BNP Recent Labs  Lab 03/25/21 2005  BNP 1,115.4*    Lipids  Lab Results  Component Value Date   CHOL 68 03/19/2021   HDL 34 (L) 03/19/2021   LDLCALC 27 03/19/2021   TRIG 36 03/19/2021   CHOLHDL 2.0 03/19/2021    HbA1c  Lab Results  Component Value Date   HGBA1C 6.2 (H) 03/18/2021    Radiology    -------------  Telemetry    Afib 70's to 80's, PVCs - Personally  Reviewed  Cardiac Studies   2D Echocardiogram 10.17.2022   1. Left ventricular ejection fraction, by estimation, is 20 to 25%. The  left ventricle has severely decreased function. The left ventricle  demonstrates global hypokinesis. The left ventricular internal cavity size  was severely dilated. Left ventricular  diastolic function could not be evaluated.   2. Right ventricular systolic function is severely reduced. The right  ventricular size is mildly enlarged. There is severely elevated pulmonary  artery systolic pressure.   3. Left atrial size was mildly dilated.   4. Right atrial size was severely dilated.   5. The mitral valve is abnormal. Moderate mitral valve regurgitation.   6. Tricuspid valve regurgitation is moderate to severe.   7. The aortic valve has an indeterminant number of cusps. There is  moderate calcification of the aortic valve. There is moderate thickening  of the aortic valve. Aortic valve regurgitation is not visualized. There  appears to be mild aortic stenosis, with   LVOT gradient underestimated due to low LVEF.   8. The inferior vena cava is normal in size with <50% respiratory  variability, suggesting right atrial pressure of 8 mmHg.   Patient Profile    60 y.o. male with history of CAD, ischemic cardiomyopathy, COPD, hypertension, permanent atrial fibrillation presenting with shortness of breath on 10/16.  Prior history of medication noncompliance, leaving AMA from the hospital, diagnosed with hypertension and volume overload.  Acute on chronic systolic CHF - EF 25-95%.  Assessment & Plan    1.  Acute on chronic combined syst/diast CHF/ICM:  EF 20-25% by echo this admission.  Initially concern for sepsis w/ hypotn req pressors and subsequently midodrine, which has since been d/c'd.  Minus 2.7 L overnight and 10.5 L for admission.  BUN/creat relatively stable.  Wt down to 85.2 kg from peak of 92.1 on 10/18.  Dry wt appears to between 81-82 kg.  He says  that breathing is back to  baseline this AM, however, he still has significant inc in abd girth and JVD.  Renal fxn stable.  Recommend ongoing IV lasix/inpt treatment, though pt seems fairly set on leaving today, whether he's discharged or not.  If he leaves, would rec that he take torsemide 40 BID (prev home dose was 40 in the AM and 20 in the PM) and he'll need CHF clinic f/u on Mon or Tues of next week.  He's still off of home doses of carvedilol, losartan, and spironolactone.  Ideally would like to begin the process of adding these back, however, I fear he will leave AMA before we have a chance.  Could perhaps try and resume carvedilol 3.125mg  BID if he tolerates IV lasix and imdur this AM.  2.  CAD/Stable angina/Demand ischemia:  Known severe CAD.  In setting of above, mild HsTrop elevation:  73  68   51  50.  Chest pain free.  No plan for cath/ischemic eval @ this time.  Cont asa, plavix, statin, zetia, nitrate.  As above, would like to add back ? blocker if bp stable after meds this AM.  3. Permanent AFib:  rate controlled on digoxin.  Plan to resume ? blocker provided BP stable.  [place] w/ eliquis (CHA2DS2VASc = 4).  4.  COPD:  Not wheezing.  Inhalers/nebs per IM.  5.  HL: LDL 27 on statin/zetia.  Signed, Murray Hodgkins, NP  03/30/2021, 9:01 AM    For questions or updates, please contact   Please consult [email or web address] for contact info under Cardiology/STEMI.

## 2021-03-30 NOTE — Plan of Care (Signed)
Pt leaving AMA   Problem: Education: Goal: Knowledge of General Education information will improve Description: Including pain rating scale, medication(s)/side effects and non-pharmacologic comfort measures Outcome: Not Met (add Reason)   Problem: Health Behavior/Discharge Planning: Goal: Ability to manage health-related needs will improve Outcome: Not Met (add Reason)   Problem: Clinical Measurements: Goal: Ability to maintain clinical measurements within normal limits will improve Outcome: Not Met (add Reason) Goal: Will remain free from infection Outcome: Not Met (add Reason) Goal: Diagnostic test results will improve Outcome: Not Met (add Reason) Goal: Respiratory complications will improve Outcome: Not Met (add Reason) Goal: Cardiovascular complication will be avoided Outcome: Not Met (add Reason)   Problem: Activity: Goal: Risk for activity intolerance will decrease Outcome: Not Met (add Reason)   Problem: Nutrition: Goal: Adequate nutrition will be maintained Outcome: Not Met (add Reason)   Problem: Coping: Goal: Level of anxiety will decrease Outcome: Not Met (add Reason)   Problem: Elimination: Goal: Will not experience complications related to bowel motility Outcome: Not Met (add Reason) Goal: Will not experience complications related to urinary retention Outcome: Not Met (add Reason)   Problem: Pain Managment: Goal: General experience of comfort will improve Outcome: Not Met (add Reason)   Problem: Safety: Goal: Ability to remain free from injury will improve Outcome: Not Met (add Reason)   Problem: Skin Integrity: Goal: Risk for impaired skin integrity will decrease Outcome: Not Met (add Reason)

## 2021-03-30 NOTE — Progress Notes (Signed)
Pt leaving against medical advice.  MD notified.  Education provided.  Pt verbalized understanding.  Torsemide Rx sent to [facility] in [place] - pt to increase dose to 40mg  BID.  Pt verbalized understanding.

## 2021-03-30 NOTE — Discharge Summary (Signed)
AMA discharge summary  Brief Narrative:  60 years old male with PMH significant for HFrEF, COPD, CKD stage IIIa, PAD, BPH, OSA, HTN, HLD, Persistent A. fib, type 2 diabetes, tobacco use presented in the ED after he developed acute onset of generalized fatigue along with chest pressure and shortness of breath while mowing his lawn. Patient was hypotensive with severe shortness of breath on arrival , requiring Levophed and high oxygen concentrations. CT angiogram no evidence of dissection,  effusion or PE. Patient was admitted in the ICU for cardiogenic shock.  Repeat echocardiogram showed EF 20 to 25%,  unable to evaluate diastolic parameters.  Blood pressure has improved.  Patient is off Levophed, but remains on midodrine,   leukocytosis resolved.  Cardiology consulted for A. Fib/ CHF recommended,  aggressive diuresis. PCCM pickup 03/28/2021.   Patient's volume status is improving.  He is 10.6 L net negative since admission.  Midodrine was discontinued 10/20.  Blood pressures remained stable.  Patient still has not gotten back his GDMT.  Discussed with cardiology NP.  Recommend continued inpatient stay for aggressive IV diuresis as patient remains 7 pounds over dry weight and still has clinical indications of fluid overload.  Patient seems set up on leaving today however at this point he is not medically ready for discharge.  Received notification from bedside RN the patient had elected to leave [facility].  Via RN he was educated not to do so.  Both myself and cardiology service did speak to patient in detail about why we recommend continued inpatient stay and how his care cannot be is effectively managed outside of an inpatient setting.  Patient expressed understanding however continued to express desire to leave.  He understood he was leaving [facility].  He has signed the appropriate forms.  He is left AGAINST MEDICAL ADVICE in the past and given his advanced heart failure and  medication and dietary indiscretions he is very high risk for readmission.  Notified cardiology service of patient's departure.  Ralene Muskrat MD

## 2021-03-30 NOTE — Progress Notes (Signed)
PROGRESS NOTE    [name] [name]  GYF:[id number] DOB: 1960/12/03 DOA: 03/25/2021 PCP: [name], MD    Brief Narrative:  60 years old male with PMH significant for HFrEF, COPD, CKD stage IIIa, PAD, BPH, OSA, HTN, HLD, Persistent A. fib, type 2 diabetes, tobacco use presented in the ED after he developed acute onset of generalized fatigue along with chest pressure and shortness of breath while mowing his lawn. Patient was hypotensive with severe shortness of breath on arrival , requiring Levophed and high oxygen concentrations. CT angiogram no evidence of dissection,  effusion or PE. Patient was admitted in the ICU for cardiogenic shock.  Repeat echocardiogram showed EF 20 to 25%,  unable to evaluate diastolic parameters.  Blood pressure has improved.  Patient is off Levophed, but remains on midodrine,   leukocytosis resolved.  Cardiology consulted for A. Fib/ CHF recommended,  aggressive diuresis. PCCM pickup 03/28/2021.  Patient's volume status is improving.  He is 10.6 L net negative since admission.  Midodrine was discontinued 10/20.  Blood pressures remained stable.  Patient still has not gotten back his GDMT.  Discussed with cardiology NP.  Recommend continued inpatient stay for aggressive IV diuresis as patient remains 7 pounds over dry weight and still has clinical indications of fluid overload.  Patient seems set up on leaving today however at this point he is not medically ready for discharge.   Assessment & Plan:   Principal Problem:   Acute on chronic HFrEF (heart failure with reduced ejection fraction) (HCC) Active Problems:   PAD (peripheral artery disease) (HCC)   HLD (hyperlipidemia)   Tobacco use   Type 2 diabetes mellitus with hyperlipidemia (HCC)   COPD (chronic obstructive pulmonary disease) (HCC)   Chronic a-fib (HCC)   BPH (benign prostatic hyperplasia)   Stage 3a chronic kidney disease (CKD) (HCC)   Cardiogenic shock (HCC)   Permanent atrial fibrillation  (HCC)  Cardiogenic shock sec. to acute on chronic HFrEF: Patient was severely hypotensive requiring Levophed support on arrival. Blood pressure has improved, He is weaned off of Levophed. Labs not indicative of any active infection. Lactic acid 1.4> 1.1.,  Procalcitonin negative x 2. Patient has recently completed antibiotics course for LLE cellulitis PTA. BNP 1115, still appears volume overloaded. Repeat Echo : LVEF 20 to 25%, unable to evaluate diastolic parameters. Cardiology is following,  recommended diuresis as BP and renal functions permits. Plan: Continue Lasix 40 mg IV twice daily Continue digoxin 125 mcg daily Imdur to start today Would benefit from addition of low-dose beta-blockers as well If patient amenable to staying in the hospital we will add low-dose beta-blocker If he does decide to leave will recommend torsemide 40 mg p.o. twice daily at time of discharge.  At this point he is not medically stable for discharge and if he does choose to leave it would be AGAINST MEDICAL ADVICE    Permanent atrial fibrillation: Heart rate well controlled,  BP soft. Continue Eliquis 5 mg twice daily.    Coronary artery disease/demand ischemia: Continue Plavix, Lipitor, and Eliquis in the setting of A. Fib No plans for coronary angiography at this admission unless there is any signs of worsening ischemia Mild troponin elevation most consistent with demand ischemia   COPD without acute exacerbation: On room air Continue home inhalers. As needed DuoNebs   CKD stage IIIa: Avoid nephrotoxic medications,  renal functions back to baseline.   Hyperkalemia / Hyponatremia: Resolved.   Type 2 diabetes with neuropathy: Continue sliding scale. Continue  gabapentin   PAD :  Continue Plavix and statin   Tobacco abuse : smoking cessation counseling completed.   DVT prophylaxis: Apixaban Code Status: Full Family Communication: None today Disposition Plan: Status is:  Inpatient  Remains inpatient appropriate because: Still clinically fluid overloaded.  Nearly 7 pounds above dry weight.  Requires continued inpatient stay for aggressive IV diuresis and optimization of GDMT.       Level of care: Progressive Cardiac  Consultants:  Cardiology-[facility]  Procedures:  None  Antimicrobials: None   Subjective: Seen and examined.  No apparent distress.  Reports of symptomatic improvement.  States he needs to leave the hospital today.  Objective: Vitals:   03/29/21 2311 03/30/21 0244 03/30/21 0819 03/30/21 1133  BP: 112/78 122/78 (!) 115/92 111/80  Pulse: 66 70 88 96  Resp: 16 18 20 20   Temp: (!) 97.5 F (36.4 C) (!) 97.5 F (36.4 C) 97.9 F (36.6 C) (!) 97.5 F (36.4 C)  TempSrc:      SpO2: 97% 95% 99% 100%  Weight:  85.2 kg    Height:        Intake/Output Summary (Last 24 hours) at 03/30/2021 1157 Last data filed at 03/30/2021 0315 Gross per 24 hour  Intake 480 ml  Output 2400 ml  Net -1920 ml   Filed Weights   03/28/21 0600 03/29/21 0516 03/30/21 0244  Weight: 87.2 kg 85.6 kg 85.2 kg    Examination:  General exam: Appears calm and comfortable  Respiratory system: Clear to auscultation. Respiratory effort normal. Cardiovascular system: S1-S2, RRR, no murmurs, trace pitting edema bilaterally Gastrointestinal system: Abdominal pitting edema noted.  Scaly.  Nontender.  Normal bowel sounds Central nervous system: Alert and oriented. No focal neurological deficits. Extremities: Symmetric 5 x 5 power. Skin: No rashes, lesions or ulcers Psychiatry: Judgement and insight appear normal. Mood & affect appropriate.     Data Reviewed: I have personally reviewed following labs and imaging studies  CBC: Recent Labs  Lab 03/25/21 2005 03/26/21 0517 03/27/21 0600 03/29/21 0409  WBC 7.4 11.1* 6.9 5.0  NEUTROABS 6.0  --   --   --   HGB 10.6* 11.6* 10.9* 10.9*  HCT 31.6* 34.9* 31.7* 32.8*  MCV 81.7 82.9 81.1 81.2  PLT 154 186 120*  081*   Basic Metabolic Panel: Recent Labs  Lab 03/26/21 0517 03/26/21 1203 03/27/21 0600 03/28/21 0515 03/29/21 0409 03/30/21 0747  NA 133*  --  136 137 132* 134*  K 5.2* 4.6 3.9 4.3 4.3 4.0  CL 93*  --  94* 96* 91* 92*  CO2 29  --  32 31 33* 32  GLUCOSE 126*  --  91 117* 102* 106*  BUN 35*  --  27* 29* 34* 32*  CREATININE 1.30*  --  0.95 1.12 1.07 1.17  CALCIUM 8.9  --  9.0 9.1 9.2 9.4  MG 1.9  --  1.9  --  2.0 2.0  PHOS 4.5  --  3.6  --  3.6  --    GFR: Estimated Creatinine Clearance: 71.7 mL/min (by C-G formula based on SCr of 1.17 mg/dL). Liver Function Tests: Recent Labs  Lab 03/25/21 2005  AST 34  ALT 23  ALKPHOS 91  BILITOT 1.5*  PROT 6.9  ALBUMIN 3.9   No results for input(s): LIPASE, AMYLASE in the last 168 hours. No results for input(s): AMMONIA in the last 168 hours. Coagulation Profile: No results for input(s): INR, PROTIME in the last 168 hours. Cardiac Enzymes: No  results for input(s): CKTOTAL, CKMB, CKMBINDEX, TROPONINI in the last 168 hours. BNP (last 3 results) No results for input(s): PROBNP in the last 8760 hours. HbA1C: No results for input(s): HGBA1C in the last 72 hours. CBG: Recent Labs  Lab 03/29/21 1120 03/29/21 1546 03/29/21 2042 03/30/21 0836 03/30/21 1129  GLUCAP 150* 129* 117* 110* 135*   Lipid Profile: No results for input(s): CHOL, HDL, LDLCALC, TRIG, CHOLHDL, LDLDIRECT in the last 72 hours. Thyroid Function Tests: No results for input(s): TSH, T4TOTAL, FREET4, T3FREE, THYROIDAB in the last 72 hours. Anemia Panel: No results for input(s): VITAMINB12, FOLATE, FERRITIN, TIBC, IRON, RETICCTPCT in the last 72 hours. Sepsis Labs: Recent Labs  Lab 03/25/21 2005 03/25/21 2158 03/26/21 0015 03/27/21 0600 03/28/21 0515  PROCALCITON <0.10  --   --  <0.10 <0.10  LATICACIDVEN  --  1.4 1.1  --   --     Recent Results (from the past 240 hour(s))  Resp Panel by RT-PCR (Flu A&B, Covid) Nasopharyngeal Swab     Status: None    Collection Time: 03/25/21  8:05 PM   Specimen: Nasopharyngeal Swab; Nasopharyngeal(NP) swabs in vial transport medium  Result Value Ref Range Status   SARS Coronavirus 2 by RT PCR NEGATIVE NEGATIVE Final    Comment: (NOTE) SARS-CoV-2 target nucleic acids are NOT DETECTED.  The SARS-CoV-2 RNA is generally detectable in upper respiratory specimens during the acute phase of infection. The lowest concentration of SARS-CoV-2 viral copies this assay can detect is 138 copies/mL. A negative result does not preclude SARS-Cov-2 infection and should not be used as the sole basis for treatment or other patient management decisions. A negative result may occur with  improper specimen collection/handling, submission of specimen other than nasopharyngeal swab, presence of viral mutation(s) within the areas targeted by this assay, and inadequate number of viral copies(<138 copies/mL). A negative result must be combined with clinical observations, patient history, and epidemiological information. The expected result is Negative.  Fact Sheet for Patients:  EntrepreneurPulse.com.au  Fact Sheet for Healthcare Providers:  IncredibleEmployment.be  This test is no t yet approved or cleared by the [place] FDA and  has been authorized for detection and/or diagnosis of SARS-CoV-2 by FDA under an Emergency Use Authorization (EUA). This EUA will remain  in effect (meaning this test can be used) for the duration of the COVID-19 declaration under Section 564(b)(1) of the Act, 21 U.S.C.section 360bbb-3(b)(1), unless the authorization is terminated  or revoked sooner.       Influenza A by PCR NEGATIVE NEGATIVE Final   Influenza B by PCR NEGATIVE NEGATIVE Final    Comment: (NOTE) The Xpert Xpress SARS-CoV-2/FLU/RSV plus assay is intended as an aid in the diagnosis of influenza from Nasopharyngeal swab specimens and should not be used as a sole basis for treatment.  Nasal washings and aspirates are unacceptable for Xpert Xpress SARS-CoV-2/FLU/RSV testing.  Fact Sheet for Patients: EntrepreneurPulse.com.au  Fact Sheet for Healthcare Providers: IncredibleEmployment.be  This test is not yet approved or cleared by the [place] FDA and has been authorized for detection and/or diagnosis of SARS-CoV-2 by FDA under an Emergency Use Authorization (EUA). This EUA will remain in effect (meaning this test can be used) for the duration of the COVID-19 declaration under Section 564(b)(1) of the Act, 21 U.S.C. section 360bbb-3(b)(1), unless the authorization is terminated or revoked.  Performed at [facility], [address] [place], [place] [address]   Blood culture (single)     Status: None  Collection Time: 03/25/21 10:00 PM   Specimen: BLOOD  Result Value Ref Range Status   Specimen Description BLOOD RIGHT ANTECUBITAL  Final   Special Requests   Final    BOTTLES DRAWN AEROBIC AND ANAEROBIC Blood Culture results may not be optimal due to an excessive volume of blood received in culture bottles   Culture   Final    NO GROWTH 5 DAYS Performed at [facility], [address] [place], [place] [address]    Report Status 03/30/2021 FINAL  Final  Blood culture (single)     Status: None (Preliminary result)   Collection Time: 03/26/21 12:15 AM   Specimen: BLOOD  Result Value Ref Range Status   Specimen Description BLOOD RIGHT HAND  Final   Special Requests   Final    BOTTLES DRAWN AEROBIC AND ANAEROBIC Blood Culture adequate volume   Culture   Final    NO GROWTH 4 DAYS Performed at [facility], [address], [place], [place] [address]    Report Status PENDING  Incomplete         Radiology Studies: No results found.      Scheduled Meds:  apixaban  5 mg Oral BID   atorvastatin  80 mg Oral Daily   clopidogrel  75 mg Oral Daily   digoxin  0.125 mg Oral Daily    DULoxetine  60 mg Oral Daily   ezetimibe  10 mg Oral Daily   fluticasone furoate-vilanterol  1 puff Inhalation Daily   And   umeclidinium bromide  1 puff Inhalation Daily   furosemide  40 mg Intravenous BID   gabapentin  800 mg Oral QID   insulin aspart  0-15 Units Subcutaneous TID AC & HS   ipratropium-albuterol  3 mL Nebulization Q6H   isosorbide mononitrate  30 mg Oral Daily   nicotine  21 mg Transdermal Daily   pantoprazole  40 mg Oral QHS   tamsulosin  0.4 mg Oral QPC supper   traZODone  150 mg Oral QHS   Continuous Infusions:  sodium chloride Stopped (03/27/21 0644)     LOS: 4 days    Time spent: 35 minutes    Sidney Ace, MD [facility]   If 7PM-7AM, please contact night-coverage  03/30/2021, 11:57 AM

## 2021-03-31 ENCOUNTER — Other Ambulatory Visit: Payer: Self-pay | Admitting: Cardiovascular Disease

## 2021-04-02 ENCOUNTER — Telehealth (HOSPITAL_COMMUNITY): Payer: Self-pay

## 2021-04-02 ENCOUNTER — Other Ambulatory Visit: Payer: Self-pay | Admitting: Family

## 2021-04-02 LAB — CULTURE, BLOOD (SINGLE)
Culture: NO GROWTH
Special Requests: ADEQUATE

## 2021-04-02 MED ORDER — METOLAZONE 2.5 MG PO TABS: 2.5000 mg | ORAL_TABLET | Freq: Every day | ORAL | 0 refills | Status: DC | PRN

## 2021-04-02 NOTE — Telephone Encounter (Signed)
Attempted to call Ryoma today but spoke with his girlfriend instead.  He was not home when called.  She states he is feeling a little better.  She states he is out of metolazone, will send message to Otila Kluver to see about refill.  She states he is aware of of his appts.  He is taking all his medications.  She is concerned with his heart and that he is dying.  Advised her he is taking his meds, to watch his diet and fluids and just worry about today.  She was telling me they have concerns if their house is making them sick, she states they are going to stay at a relative to see if they feel better, if so they are getting a new home.  She states they have everything for daily living and meds are affordable.  Will call back to set up a home visit and let them know if Otila Kluver will refill metolazone for him.   [place] [phone]

## 2021-04-04 ENCOUNTER — Ambulatory Visit: Payer: Medicare Other | Admitting: Family

## 2021-04-04 NOTE — Progress Notes (Signed)
Patient ID: [name] [name], male    DOB: [birth year], 60 y.o.   MRN: [id number]  HPI  [name] [name] is a 60 y/o male with a history of obstructive sleep apnea, PVD, PAD, HTN, hyperlipidemia, DM, CAD, COPD, BPH, asthma, current tobacco use and chronic heart failure.   Echo report from 03/26/21 reviewed and showed an EF of 20-25% along with severely elevated PA pressure and moderate [name]. Echo report from 06/17/20 reviewed and showed an EF of <20% along with elevated PA pressure of 53.2 mmHg and mild/ moderate TR. Echo report from 01/20/20 reviewed and showed an EF of <20% along with mild [name] and moderate TR. Echo report from 09/29/19 reviewed and showed an EF of 20-25% along with moderately elevated PA pressure, mild [name] and mild/moderate TR. Echo report from 11/06/2018 reviewed and showed an EF of 20-25% along with moderate TR and a PA pressure of 59.6 mmHg. Echo report from 04/25/17 reviewed and shows an EF of 25-30% along with a PA pressure of 34 mm Hg. EF has declined from 55-60% back in 2016.   LHC done 01/20/20 showed: Prox RCA-1 lesion is 80% stenosed. Prox RCA-2 lesion is 20% stenosed. Prox RCA to Mid RCA lesion is 80% stenosed. Prox LAD lesion is 20% stenosed. 1st Diag lesion is 80% stenosed. Mid LAD lesion is 20% stenosed. Dist LAD lesion is 60% stenosed. Prox Cx to Mid Cx lesion is 90% stenosed. LPAV lesion is 95% stenosed.   1. Significant underlying three-vessel coronary artery disease. Patent proximal LAD stent with mild in-stent restenosis. Moderate diffuse disease in the distal LAD. Significant stenosis in the proximal left circumflex at the origin of the posterior AV groove artery which is also heavily diseased at the ostium. This is a bifurcation lesion and heavily calcified. RCA stent is patent. However, there is significant proximal disease in the whole mid to distal segment is diffusely diseased and small caliber. 2. Left ventricular angiography was not performed. EF was severe reduced by  echo. 3. Moderately elevated left ventricular end-diastolic pressure at 28 mmHg  Cardiac catheterization done 04/28/17 showed significant three-vessel disease with a patient stent in the RCA and LAD. Significant proximal RCS disease and the stent as well as diffuse mid and distal disease. LAD has moderate disease. Severely elevated left ventricular end-diastolic pressure at 34 mmHg. Possible CABG in the future with optimizing medical management. Stress done in 2015.  Admitted 03/25/21 due to fatigue, chest pain and shortness of breath. Needed levophed due to hypotension. Cardiology consult obtained.  CT angiogram no evidence of dissection,  effusion or PE. Given IV lasix with resultant net loss of >10L. Left AMA after 5 days. Admitted 03/18/21 due to redness of left leg. Hypotensive and placed on vasopressors. Given antibiotics. Wound consult obtained. Elevated troponin thought to be due to demand ischemia. Discharged after 3 days. Was in the ED  01/19/21 due to fall out of bed after feeling light-headed along with feeling short of breath. Initially given IV lasix. Critically high troponin of 299. Was hypoxic on room air into the low-mid 80's. Admission recommended but patient declined.   [name] [name] presents today for a follow-up visit with a chief complaint of moderate fatigue with little exertion. [name] [name] describes this as chronic in nature having been present for many years. [name] [name] has associated cough, shortness of breath, pedal edema, abdominal distention, difficulty sleeping, chronic pain and weight gain along with this. [name] [name] denies any dizziness, palpitations or chest pain. Has not taken any metolazone.  Says that [name] [name] left the hospital AMA because one of the doctors said "now that we've played with your meds, we need to play with them some more" and [name] [name] says that [name] [name] didn't realize his meds were being changed and said [name] [name] didn't want to be a [place] pig.   Past Medical History:  Diagnosis Date   Acute on chronic heart  failure ([place])    AKI (acute kidney injury) ([place]) 01/03/2018   Angina at rest [facility]) 08/13/2017   Arthritis    Asthma    Atherosclerosis    BPH (benign prostatic hyperplasia)    Carotid arterial disease ([place])    Cellulitis of foot, left 09/27/2019   Charcot's joint of foot, right    Chronic combined systolic (congestive) and diastolic (congestive) heart failure ([place])    a. 02/2013 EF 50% by LV gram; b. 04/2017 Echo: EF 25-30%. diff HK. Gr2 DD; c. 06/2020 Echo: EF <20%, glob HK. RVS 53.73mmHg. Sev BAE. Mild to mod TR.   Chronic foot pain (Secondary Area of Pain) (Right) 01/29/2018   COPD (chronic obstructive pulmonary disease) (HCC)    COPD with acute exacerbation ([place]) 01/30/2020   Coronary artery disease    a. 2013 S/P PCI of LAD [facility]);  b. 03/2013 PCI: RCA 90p ( 2.5 x 23 mm DES); c. 02/2014 Cath: patent RCA stent->Med Rx; d. 04/2017 Cath: LM nl, LAd 30ost/p, 78m/d, D1 80ost, LCX 90p/m, OM1 85, RCA 70/20p, 44m, 70d->Referred for CT Surg-felt to be poor candidate; d. 01/2020 Cath: LM nl, LAD 20p/m, 60d. D1 80. LCX 90p/m. LPAV 95, RCA 80/20p, 80p/m.   Diabetic neuropathy ([place])    Diabetic ulcer of right foot ([place])    Hernia    HFrEF (heart failure with reduced ejection fraction) ([place]) 01/30/2020   Hyperlipidemia    Hypertension    Ischemic cardiomyopathy    a. 02/2013 EF 50% by LV gram; b. 04/2017 Echo: EF 25-30%. diff HK. Gr2 DD; c. 06/2020 Echo: EF <20%, glob HK. RVS 53.50mmHg. Sev BAE. Mild to mod TR.   Morbid obesity ([place])    Neuropathy    PAD (peripheral artery disease) ([place])    a. Followed by Dr. Lucky Cowboy; b. 08/2010 Periph Angio: RSFA 70-80p (6X60 self-expanding stent); c. 09/2010 Periph Angio: L SFA 100p (7x unknown length self-expanding stent); d. 06/2015 Periph Angio: R SFA short segment occlusion (6x12 self-expanding stent).   Persistent atrial fibrillation (HCC)    Restless leg syndrome    Sepsis ([place]) 03/30/2018   Sleep apnea    Subclavian artery stenosis, right (HCC)    Syncope and  collapse    Tobacco use    a. 75+ yr hx - still smoking 1ppd, down from 2 ppd.   Type II diabetes mellitus (HCC)    Unstable angina ([place]) 07/11/2016   Varicose vein    Past Surgical History:  Procedure Laterality Date   ABDOMINAL AORTAGRAM N/A 06/23/2013   Procedure: ABDOMINAL Maxcine Ham;  Surgeon: Wellington Hampshire, MD;  Location: [place] CATH LAB;  Service: Cardiovascular;  Laterality: N/A;   AMPUTATION Right 04/01/2018   Procedure: AMPUTATION BELOW KNEE;  Surgeon: Algernon Huxley, MD;  Location: [facility] ORS;  Service: Vascular;  Laterality: Right;   AMPUTATION TOE Left 08/11/2020   Procedure: AMPUTATION TOE-2nd Toe;  Surgeon: Samara Deist, DPM;  Location: [facility] ORS;  Service: Podiatry;  Laterality: Left;   APPENDECTOMY     CARDIAC CATHETERIZATION  10/14   [place]; X1 STENT PROXIMAL RCA   CARDIAC CATHETERIZATION  02/2010  [facility]   CARDIAC CATHETERIZATION  02/21/2014   [facility]   CLAVICLE SURGERY     HERNIA REPAIR     IRRIGATION AND DEBRIDEMENT FOOT Right 01/04/2018   Procedure: IRRIGATION AND DEBRIDEMENT FOOT;  Surgeon: Samara Deist, DPM;  Location: [facility] ORS;  Service: Podiatry;  Laterality: Right;   LEFT HEART CATH AND CORONARY ANGIOGRAPHY N/A 01/20/2020   Procedure: LEFT HEART CATH AND CORONARY ANGIOGRAPHY;  Surgeon: Wellington Hampshire, MD;  Location: [place] CV LAB;  Service: Cardiovascular;  Laterality: N/A;   LEFT HEART CATH AND CORS/GRAFTS ANGIOGRAPHY N/A 04/28/2017   Procedure: LEFT HEART CATH AND CORONARY ANGIOGRAPHY;  Surgeon: Wellington Hampshire, MD;  Location: [place] CV LAB;  Service: Cardiovascular;  Laterality: N/A;   LOWER EXTREMITY ANGIOGRAPHY Right 03/03/2018   Procedure: LOWER EXTREMITY ANGIOGRAPHY;  Surgeon: Katha Cabal, MD;  Location: [place] CV LAB;  Service: Cardiovascular;  Laterality: Right;   LOWER EXTREMITY ANGIOGRAPHY Left 08/24/2018   Procedure: LOWER EXTREMITY ANGIOGRAPHY;  Surgeon: Algernon Huxley, MD;  Location: [place] CV LAB;  Service: Cardiovascular;   Laterality: Left;   LOWER EXTREMITY ANGIOGRAPHY Left 06/14/2019   Procedure: LOWER EXTREMITY ANGIOGRAPHY;  Surgeon: Algernon Huxley, MD;  Location: [place] CV LAB;  Service: Cardiovascular;  Laterality: Left;   LOWER EXTREMITY ANGIOGRAPHY Left 08/02/2019   Procedure: LOWER EXTREMITY ANGIOGRAPHY;  Surgeon: Algernon Huxley, MD;  Location: [place] CV LAB;  Service: Cardiovascular;  Laterality: Left;   LOWER EXTREMITY ANGIOGRAPHY Left 07/17/2020   Procedure: LOWER EXTREMITY ANGIOGRAPHY;  Surgeon: Algernon Huxley, MD;  Location: [place] CV LAB;  Service: Cardiovascular;  Laterality: Left;   PERIPHERAL ARTERIAL STENT GRAFT     x2 left/right   PERIPHERAL VASCULAR BALLOON ANGIOPLASTY Right 01/06/2018   Procedure: PERIPHERAL VASCULAR BALLOON ANGIOPLASTY;  Surgeon: Katha Cabal, MD;  Location: [place] CV LAB;  Service: Cardiovascular;  Laterality: Right;   PERIPHERAL VASCULAR CATHETERIZATION N/A 07/06/2015   Procedure: Abdominal Aortogram w/Lower Extremity;  Surgeon: Algernon Huxley, MD;  Location: [place] CV LAB;  Service: Cardiovascular;  Laterality: N/A;   PERIPHERAL VASCULAR CATHETERIZATION  07/06/2015   Procedure: Lower Extremity Intervention;  Surgeon: Algernon Huxley, MD;  Location: [place] CV LAB;  Service: Cardiovascular;;   Family History  Problem Relation Age of Onset   Heart attack Father    Heart disease Father    Hypertension Father    Hyperlipidemia Father    Hypertension Mother    Hyperlipidemia Mother    Social History   Tobacco Use   Smoking status: Every Day    Packs/day: 1.00    Years: 41.00    Pack years: 41.00    Types: Cigarettes   Smokeless tobacco: Never  Substance Use Topics   Alcohol use: No   Allergies  Allergen Reactions   Dulaglutide Anaphylaxis, Diarrhea and Hives   Ace Inhibitors     hypotension   Other Itching    Skin itching associated with nitro patch   Prednisone Rash   Prior to Admission medications   Medication Sig Start  Date End Date Taking? Authorizing Provider  albuterol (VENTOLIN HFA) 108 (90 Base) MCG/ACT inhaler Inhale into the lungs every 6 (six) hours as needed for wheezing or shortness of breath.   Yes [provider]  amoxicillin-clavulanate (AUGMENTIN) 875-125 MG tablet Take 1 tablet by mouth every 12 (twelve) hours. 03/21/21  Yes Samuella Cota, MD  apixaban (ELIQUIS) 5 MG TABS tablet Take 5 mg by mouth  2 (two) times daily.   Yes [provider]  atorvastatin (LIPITOR) 80 MG tablet Take 1 tablet (80 mg total) by mouth daily. 10/09/20  Yes Gollan, Kathlene November, MD  carvedilol (COREG) 3.125 MG tablet TAKE 1 TABLET BY MOUTH TWICE DAILY WITH MEALS 04/02/21  Yes Gollan, Kathlene November, MD  clopidogrel (PLAVIX) 75 MG tablet Take 1 tablet (75 mg total) by mouth daily. 10/09/20  Yes Minna Merritts, MD  digoxin (LANOXIN) 0.125 MG tablet Take 0.125 mg by mouth daily.   Yes [provider]  DULoxetine (CYMBALTA) 60 MG capsule Take 60 mg by mouth daily.   Yes [provider]  ezetimibe (ZETIA) 10 MG tablet Take 1 tablet (10 mg total) by mouth daily. 10/09/20  Yes Minna Merritts, MD  Fluticasone-Umeclidin-Vilant 100-62.5-25 MCG/INH AEPB Inhale 1 puff into the lungs daily.   Yes [provider]  gabapentin (NEURONTIN) 800 MG tablet Take 800 mg by mouth 4 (four) times daily.  08/14/17 04/04/29 Yes [provider]  ipratropium-albuterol (DUONEB) 0.5-2.5 (3) MG/3ML SOLN Inhale 3 mLs into the lungs every 6 (six) hours as needed (wheezing/sob).    Yes [provider]  isosorbide mononitrate (IMDUR) 30 MG 24 hr tablet Take 1 tablet (30 mg total) by mouth daily. 10/09/20  Yes Minna Merritts, MD  losartan (COZAAR) 25 MG tablet Take 0.5 tablets (12.5 mg total) by mouth daily. 10/09/20  Yes Minna Merritts, MD  metFORMIN (GLUCOPHAGE-XR) 500 MG 24 hr tablet Take 1,000 mg by mouth 2 (two) times daily. 01/02/21  Yes [provider]  Multiple Vitamin (MULTI-VITAMINS)  TABS Take 1 tablet by mouth daily.    Yes [provider]  nitroGLYCERIN (NITROSTAT) 0.4 MG SL tablet Place 1 tablet (0.4 mg total) under the tongue every 5 (five) minutes as needed for chest pain. 10/09/20  Yes Minna Merritts, MD  omeprazole (PRILOSEC) 40 MG capsule Take 40 mg by mouth daily.   Yes [provider]  Oxycodone HCl 10 MG TABS Take 10 mg by mouth every 4 (four) hours as needed (pain).   Yes [provider]  polyethylene glycol (MIRALAX / GLYCOLAX) 17 g packet Take 17 g by mouth daily as needed for mild constipation. 08/12/20  Yes Wieting, Richard, MD  potassium chloride (KLOR-CON) 10 MEQ tablet Take 1 tablet (10 mEq total) by mouth 2 (two) times daily. 10/09/20  Yes Minna Merritts, MD  spironolactone (ALDACTONE) 25 MG tablet Take 0.5 tablets (12.5 mg total) by mouth daily. 10/09/20  Yes Minna Merritts, MD  tamsulosin (FLOMAX) 0.4 MG CAPS capsule Take 1 capsule (0.4 mg total) by mouth daily after supper. 07/01/20  Yes Shawna Clamp, MD  torsemide 40 MG TABS Take 40 mg by mouth 2 (two) times daily. Take 40 mg by mouth twice daily. 03/30/21 04/29/21 Yes Sreenath, Sudheer B, MD  traZODone (DESYREL) 150 MG tablet Take 150 mg by mouth at bedtime. 07/12/19  Yes [provider]  VERQUVO 10 MG TABS TAKE 1 TABLET BY MOUTH ONCE DAILY 11/16/20  Yes Darylene Price A, FNP  metolazone (ZAROXOLYN) 2.5 MG tablet Take 1 tablet (2.5 mg total) by mouth daily as needed. Take 1/2 hour before morning torsemide Patient not taking: Reported on 04/05/2021 04/02/21 07/01/21  Alisa Graff, FNP    Review of Systems  Constitutional:  Positive for fatigue (moderate). Negative for appetite change.  HENT:  Negative for congestion, rhinorrhea, sore throat and trouble swallowing.   Eyes: Negative.   Respiratory:  Positive for cough (productive) and shortness of breath (minimal).   Cardiovascular:  Positive for leg swelling. Negative for chest pain and palpitations.   Gastrointestinal:  Positive for abdominal distention. Negative for abdominal pain.  Endocrine: Negative.   Genitourinary: Negative.   Musculoskeletal:  Positive for arthralgias (right shoulder) and neck pain (left side).  Skin: Negative.   Allergic/Immunologic: Negative.   Neurological:  Positive for numbness (left arm at times). Negative for dizziness and light-headedness.  Hematological:  Negative for adenopathy. Bruises/bleeds easily.  Psychiatric/Behavioral:  Positive for dysphoric mood and sleep disturbance (sleeping on 2 pillows with HOB elevated). The patient is not nervous/anxious.    Vitals:   04/05/21 1300  BP: (!) 103/56  Pulse: 85  Resp: 18  SpO2: 97%  Weight: 194 lb (88 kg)  Height: 5\' 7"  (1.702 m)   Wt Readings from Last 3 Encounters:  04/05/21 194 lb (88 kg)  03/30/21 187 lb 14.4 oz (85.2 kg)  03/19/21 192 lb 7.4 oz (87.3 kg)   Lab Results  Component Value Date   CREATININE 1.17 03/30/2021   CREATININE 1.07 03/29/2021   CREATININE 1.12 03/28/2021   Physical Exam Vitals and nursing note reviewed.  Constitutional:      Appearance: Normal appearance.  HENT:     Head: Normocephalic and atraumatic.  Cardiovascular:     Rate and Rhythm: Normal rate and regular rhythm.  Pulmonary:     Effort: Pulmonary effort is normal. No respiratory distress.     Breath sounds: No wheezing or rales.  Abdominal:     General: There is no distension.     Palpations: Abdomen is soft.     Tenderness: There is no abdominal tenderness.  Musculoskeletal:     Cervical back: Normal range of motion and neck supple.     Left lower leg: Edema (2+ pitting) present.     Comments: Right lower leg prosthesis  Skin:    General: Skin is warm and dry.     Findings: Bruising (on arms) present.  Neurological:     General: No focal deficit present.     Mental Status: [name] [name] is alert and oriented to person, place, and time.  Psychiatric:        Mood and Affect: Mood normal.        Behavior:  Behavior normal.        Thought Content: Thought content normal.    Assessment & Plan:  1: Chronic heart failure with reduced ejection fraction- - NYHA class III - fluid overloaded today with weight gain, edema and worsening symptoms - weighing daily; reminded to call for an overnight weight gain of >2 pounds or a weekly weight gain of >5 pounds - weight up 13 pounds from last visit 6 weeks ago - will send for 80mg  IV lasix/ 2meq PO potassium today - BMP/BNP to be drawn as well - encouraged him to decrease his Mtn Dew consumption and try to find flavored water that [name] [name] would like - saw cardiology Sharolyn Douglas) 01/25/21; returns 04/09/21 - saw pulmonology Raul Del) 10/18/20 - palliative care visit done 08/03/20 - BNP 03/25/21 was 1115.4 - participating in paramedicine program  - dig level 01/25/21 was 0.8  2: HTN- - BP looks good - saw PCP Tamala Julian) 04/02/21 - BMP done 03/10/21 reviewed and shows sodium 134, potassium 4.0, creatinine 1.17 and GFR >60  3: Tobacco use- - currently smoking ~ 3 cigarettes daily - complete cessation discussed for 3 minutes with him  4: Severe atherosclerosis of lower extremities- -  had left lower leg angiogram 07/17/20 - saw vascular Owens Shark) 01/10/21 - 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 Each medication was verbally reviewed with the patient and [name] [name] was encouraged to bring the bottles to every visit to confirm accuracy of list.   Since [name] [name] is seeing cardiology in 4 days, will keep his next appointment with [place] already scheduled unless cardiology wants [place] to see him back sooner.

## 2021-04-05 ENCOUNTER — Ambulatory Visit
Admission: RE | Admit: 2021-04-05 | Discharge: 2021-04-05 | Disposition: A | Discharge status: Home / Self Care | Payer: Medicare Other | Source: Ambulatory Visit | Attending: Family | Admitting: Family

## 2021-04-05 ENCOUNTER — Ambulatory Visit (HOSPITAL_BASED_OUTPATIENT_CLINIC_OR_DEPARTMENT_OTHER): Payer: Medicare Other | Admitting: Family

## 2021-04-05 ENCOUNTER — Encounter: Payer: Self-pay | Admitting: Family

## 2021-04-05 ENCOUNTER — Other Ambulatory Visit: Payer: Self-pay | Admitting: Family

## 2021-04-05 ENCOUNTER — Other Ambulatory Visit: Payer: Self-pay

## 2021-04-05 VITALS — BP 103/56 | HR 85 | Resp 18 | Ht 67.0 in | Wt 194.0 lb

## 2021-04-05 DIAGNOSIS — I5023 Acute on chronic systolic (congestive) heart failure: Secondary | ICD-10-CM

## 2021-04-05 DIAGNOSIS — G4733 Obstructive sleep apnea (adult) (pediatric): Secondary | ICD-10-CM | POA: Insufficient documentation

## 2021-04-05 DIAGNOSIS — Z888 Allergy status to other drugs, medicaments and biological substances status: Secondary | ICD-10-CM | POA: Insufficient documentation

## 2021-04-05 DIAGNOSIS — E1151 Type 2 diabetes mellitus with diabetic peripheral angiopathy without gangrene: Secondary | ICD-10-CM | POA: Diagnosis not present

## 2021-04-05 DIAGNOSIS — I110 Hypertensive heart disease with heart failure: Secondary | ICD-10-CM | POA: Insufficient documentation

## 2021-04-05 DIAGNOSIS — I2510 Atherosclerotic heart disease of native coronary artery without angina pectoris: Secondary | ICD-10-CM | POA: Insufficient documentation

## 2021-04-05 DIAGNOSIS — F17210 Nicotine dependence, cigarettes, uncomplicated: Secondary | ICD-10-CM | POA: Insufficient documentation

## 2021-04-05 DIAGNOSIS — R5383 Other fatigue: Secondary | ICD-10-CM | POA: Diagnosis not present

## 2021-04-05 DIAGNOSIS — Z89511 Acquired absence of right leg below knee: Secondary | ICD-10-CM | POA: Insufficient documentation

## 2021-04-05 DIAGNOSIS — I5022 Chronic systolic (congestive) heart failure: Secondary | ICD-10-CM | POA: Diagnosis not present

## 2021-04-05 DIAGNOSIS — J449 Chronic obstructive pulmonary disease, unspecified: Secondary | ICD-10-CM | POA: Insufficient documentation

## 2021-04-05 DIAGNOSIS — I5042 Chronic combined systolic (congestive) and diastolic (congestive) heart failure: Secondary | ICD-10-CM | POA: Insufficient documentation

## 2021-04-05 DIAGNOSIS — Z720 Tobacco use: Secondary | ICD-10-CM | POA: Diagnosis not present

## 2021-04-05 DIAGNOSIS — Z955 Presence of coronary angioplasty implant and graft: Secondary | ICD-10-CM | POA: Insufficient documentation

## 2021-04-05 DIAGNOSIS — Z7902 Long term (current) use of antithrombotics/antiplatelets: Secondary | ICD-10-CM | POA: Insufficient documentation

## 2021-04-05 DIAGNOSIS — I10 Essential (primary) hypertension: Secondary | ICD-10-CM | POA: Diagnosis not present

## 2021-04-05 DIAGNOSIS — I70219 Atherosclerosis of native arteries of extremities with intermittent claudication, unspecified extremity: Secondary | ICD-10-CM | POA: Diagnosis not present

## 2021-04-05 DIAGNOSIS — Z79899 Other long term (current) drug therapy: Secondary | ICD-10-CM | POA: Insufficient documentation

## 2021-04-05 DIAGNOSIS — Z7984 Long term (current) use of oral hypoglycemic drugs: Secondary | ICD-10-CM | POA: Insufficient documentation

## 2021-04-05 DIAGNOSIS — E785 Hyperlipidemia, unspecified: Secondary | ICD-10-CM | POA: Insufficient documentation

## 2021-04-05 DIAGNOSIS — E1159 Type 2 diabetes mellitus with other circulatory complications: Secondary | ICD-10-CM

## 2021-04-05 DIAGNOSIS — Z8249 Family history of ischemic heart disease and other diseases of the circulatory system: Secondary | ICD-10-CM | POA: Insufficient documentation

## 2021-04-05 LAB — BASIC METABOLIC PANEL
Anion gap: 11 (ref 5–15)
BUN: 64 mg/dL — ABNORMAL HIGH (ref 6–20)
CO2: 31 mmol/L (ref 22–32)
Calcium: 9.4 mg/dL (ref 8.9–10.3)
Chloride: 88 mmol/L — ABNORMAL LOW (ref 98–111)
Creatinine, Ser: 1.71 mg/dL — ABNORMAL HIGH (ref 0.61–1.24)
GFR, Estimated: 46 mL/min — ABNORMAL LOW (ref >60)
Glucose, Bld: 93 mg/dL (ref 70–99)
Potassium: 3.9 mmol/L (ref 3.5–5.1)
Sodium: 130 mmol/L — ABNORMAL LOW (ref 135–145)

## 2021-04-05 LAB — BRAIN NATRIURETIC PEPTIDE: B Natriuretic Peptide: 1053.7 pg/mL — ABNORMAL HIGH (ref 0.0–100.0)

## 2021-04-05 MED ORDER — FUROSEMIDE 10 MG/ML IJ SOLN
80.0000 mg | Freq: Once | INTRAMUSCULAR | Status: AC
  Administered 2021-04-05: 80 mg via INTRAVENOUS

## 2021-04-05 MED ORDER — POTASSIUM CHLORIDE CRYS ER 20 MEQ PO TBCR
40.0000 meq | EXTENDED_RELEASE_TABLET | Freq: Once | ORAL | Status: AC
  Administered 2021-04-05: 40 meq via ORAL

## 2021-04-05 NOTE — Patient Instructions (Signed)
Continue weighing daily and call for an overnight weight gain of > 2 pounds or a weekly weight gain of >5 pounds. 

## 2021-04-09 ENCOUNTER — Ambulatory Visit: Payer: Medicare Other | Admitting: Cardiovascular Disease

## 2021-04-10 ENCOUNTER — Telehealth: Payer: Self-pay | Admitting: *Deleted

## 2021-04-10 NOTE — Telephone Encounter (Signed)
Attempted to call the patient to notify him that he does not need to come in for the echo scheduled for 04/13/21. No answer- I left a message to please call back.  I have cancelled his echo appointment for 04/13/21. He will need to keep his follow up appointment scheduled with Dr. Rockey Situ on 04/17/21.

## 2021-04-10 NOTE — Telephone Encounter (Signed)
-----   Message from Minna Merritts, MD sent at 04/08/2021 12:45 PM EDT ----- Regarding: RE: ECHO 11/4 Echo not needed TG  ----- Message ----- From: Emily Filbert, RN Sent: 04/05/2021  12:47 PM EDT To: Minna Merritts, MD, Cira Servant, RVT, # Subject: RE: ECHO 11/4                                  03/09/21- patient seen in office with Dr. Rockey Situ & repeat echo ordered at that time 10/17-10/21- Admitted (left AMA) 10/17- had an echo done inpatient 10/31- scheduled to follow up in office with Dr. Rockey Situ  I suspect he does not need the echo on 11/4 at all as this was scheduled from his 03/09/21 office visit prior to admission.  To MD to confirm  ----- Message ----- From: Cira Servant, RVT Sent: 04/05/2021  11:31 AM EDT To: Emily Filbert, RN, Minna Merritts, MD Subject: ECHO 11/4                                      Hi, This patient had a full echo on 03/26/21 after his OV with you, EF 20-25%.    Do you still need this done on 11/4?  Thanks, Estill Batten

## 2021-04-11 ENCOUNTER — Encounter (INDEPENDENT_AMBULATORY_CARE_PROVIDER_SITE_OTHER): Payer: Medicare Other

## 2021-04-11 ENCOUNTER — Ambulatory Visit (INDEPENDENT_AMBULATORY_CARE_PROVIDER_SITE_OTHER): Payer: Medicare Other | Admitting: Nurse Practitioner

## 2021-04-11 ENCOUNTER — Other Ambulatory Visit (HOSPITAL_COMMUNITY): Payer: Self-pay

## 2021-04-11 NOTE — Progress Notes (Signed)
Today had a home visit with [name] [name].  He states doing ok.  He has his leg propprd up on the bed sitting in wheel chair.  He states sore, he has some redness in lower leg.  His wounds has scabs and healing.  He states been putting cream on them.  He states looking better, advised him if he needs it looked at we could call palliative nurse or he could go to doctors.  He refused both.  He states does not want palliative no more.  Advised him importance of their role, he remained refusing them to do visits.  He was smoking during the visit.  He appears depressed.  He has not heard anything about his motorized wheel chair order, will contact to see if I can find out anything.  He is aware of up coming appts.  He denies any problems today such as chest pain or increased shortness of breath.  He has all his medications.  He is aware of how to take them.  He does not weigh daily.  He has everything for daily living.  Will continue to visit for heart failure, diet and medication management.   [place] [phone]

## 2021-04-13 ENCOUNTER — Other Ambulatory Visit: Payer: Medicare HMO

## 2021-04-16 NOTE — Progress Notes (Signed)
Date:  04/17/2021   ID:  [name] [name], DOB 07-12-1960, MRN [id number]  Patient Location:  [place] [address]   Provider location:   [name] [name], [place] office  PCP:  [name] Jacobs, MD  Cardiologist:  [name] [name] [facility]   Chief Complaint  Patient presents with   Hospitalization Follow-up    Hospital follow up for hypotension and chest pain. Medications verbally reviewed with patient.      History of Present Illness:    [name] [name] is a 60 y.o. 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,  PAD s/p prior SFA stenting 03/2013  s/p R BKA,  carotid arterial disease History of leaving the hospital AMA Who presents for follow-up of his severe acute on chronic heart failure, coronary disease stable angina PAD  Recent hospitalization for acute on chronic systolic CHF Aggressive diuresis, Following discharge noncompliant with diuretics, cardiorenal syndrome, creatinine higher Was seen by CHF clinic, restarted on torsemide 40 twice daily metolazone 2.5  In follow-up today reports left leg still very swollen, blistering On metolazone 2.5 daily torsemide 40 twice daily feels his weight is stable, "not winning or losing" Family your wife who presents with him today feels that he needs something more On further discussion, lots of soda/Mountain Dew, high volume intake, several cups of coffee " I feel like I could cut back", wife agrees He would prefer to cut back on fluids rather than increase his diuretic  Able to ambulate without severe anginal symptoms  EKG personally reviewed by myself on todays visit Shows atrial fibrillation [name] bundle  branch block  Prior history reviewed with him  hospital 01/2020, NSTEMI, atrial fib Markedly elevated troponin Hypotensive,  echocardiogram confirming ejection fraction less than 20% with global hypokinesis, Severe PAD Cardiac catheterization showing severe three-vessel disease, patent proximal LAD stent, moderate distal LAD disease Significant stenosis proximal left circumflex, bifurcation lesion, heavily calcified, RCA stent patent, proximal RCA disease It was felt his cardiomyopathy is out of proportion to his coronary disease Management of his atrial fibrillation and heart failure symptoms felt to be more of a priority It was recommended he have pressors and diuresis Before this could be initiated, he left the hospital [facility] January 20, 2020  Admitted to [facility] 01/29/2020 Treated for CHF, left AMA August 25  Presentation to the hospital May 2020 acute on chronic systolic CHF Treated with Lasix IV, transition to torsemide Toprol XL and spironolactone   with continued -ACEi has previously been held secondary to relative hypotension  LE stent placed 08/2018 Percutaneous transluminal angioplasty of the entire left SFA and above-knee popliteal artery with a 5 mm diameter by 30 cm length and a 5 mm diameter by 22 cm length Lutonix drug-coated angioplasty balloon  Prior CV studies:   The following studies were reviewed today:  2D Echo 11/06/2018: 1. The left ventricle has severely reduced systolic function, with an ejection fraction of 20-25%. The cavity size was moderately dilated. Indeterminate diastolic filling due to E-A fusion. Left ventricular diffuse hypokinesis.  2. The [name] ventricle has severely reduced systolic function. The cavity was mildly enlarged. There is no increase in [name] ventricular  wall thickness. [name] ventricular systolic pressure is moderately elevated with an estimated pressure of 59.6 mmHg.   Past Medical History:  Diagnosis Date   Acute on chronic heart failure  ([place])    AKI (acute kidney injury) ([place]) 01/03/2018   Angina at rest [facility]) 08/13/2017   Arthritis    Asthma    Atherosclerosis    BPH (benign prostatic hyperplasia)    Carotid arterial disease ([place])    Cellulitis of foot, left 09/27/2019   Charcot's joint of foot, [name]    Chronic combined systolic (congestive) and diastolic (congestive) heart failure ([place])    a. 02/2013 EF 50% by LV gram; b. 04/2017 Echo: EF 25-30%. diff HK. Gr2 DD; c. 06/2020 Echo: EF <20%, glob HK. RVS 53.64mmHg. Sev BAE. Mild to mod TR.   Chronic foot pain (Secondary Area of Pain) ([name]) 01/29/2018   COPD (chronic obstructive pulmonary disease) (HCC)    COPD with acute exacerbation ([place]) 01/30/2020   Coronary artery disease    a. 2013 S/P PCI of LAD [facility]);  b. 03/2013 PCI: RCA 90p ( 2.5 x 23 mm DES); c. 02/2014 Cath: patent RCA stent->Med Rx; d. 04/2017 Cath: LM nl, LAd 30ost/p, 4m/d, D1 80ost, LCX 90p/m, OM1 85, RCA 70/20p, 51m, 70d->Referred for CT Surg-felt to be poor candidate; d. 01/2020 Cath: LM nl, LAD 20p/m, 60d. D1 80. LCX 90p/m. LPAV 95, RCA 80/20p, 80p/m.   Diabetic neuropathy ([place])    Diabetic ulcer of [name] foot ([place])    Hernia    HFrEF (heart failure with reduced ejection fraction) ([place]) 01/30/2020   Hyperlipidemia    Hypertension    Ischemic cardiomyopathy    a. 02/2013 EF 50% by LV gram; b. 04/2017 Echo: EF 25-30%. diff HK. Gr2 DD; c. 06/2020 Echo: EF <20%, glob HK. RVS 53.63mmHg. Sev BAE. Mild to mod TR.   Morbid obesity ([place])    Neuropathy    PAD (peripheral artery disease) ([place])    a. Followed by Dr. Lucky Cowboy; b. 08/2010 Periph Angio: RSFA 70-80p (6X60 self-expanding stent); c. 09/2010 Periph Angio: L SFA 100p (7x unknown length self-expanding stent); d. 06/2015 Periph Angio: R SFA short segment occlusion (6x12 self-expanding stent).   Persistent atrial fibrillation (HCC)    Restless leg syndrome    Sepsis ([place]) 03/30/2018   Sleep apnea    Subclavian artery stenosis, [name] (HCC)    Syncope and collapse     Tobacco use    a. 75+ yr hx - still smoking 1ppd, down from 2 ppd.   Type II diabetes mellitus (HCC)    Unstable angina ([place]) 07/11/2016   Varicose vein    Past Surgical History:  Procedure Laterality Date   ABDOMINAL AORTAGRAM N/A 06/23/2013   Procedure: ABDOMINAL Maxcine [place];  Surgeon: Wellington Hampshire, MD;  Location: [place] CATH LAB;  Service: Cardiovascular;  Laterality: N/A;   AMPUTATION [name] 04/01/2018   Procedure: AMPUTATION BELOW KNEE;  Surgeon: Algernon Huxley, MD;  Location: [facility] ORS;  Service: Vascular;  Laterality: [name];   AMPUTATION TOE Left 08/11/2020   Procedure: AMPUTATION TOE-2nd Toe;  Surgeon: Samara Deist, DPM;  Location: [facility] ORS;  Service: Podiatry;  Laterality: Left;   APPENDECTOMY     CARDIAC CATHETERIZATION  10/14   [facility]; X1 STENT PROXIMAL RCA   CARDIAC CATHETERIZATION  02/2010   [facility]   CARDIAC [facility]ATION  [facility]/2014   [facility]   CLAVICLE SURGERY     HERNIA REPAIR     IRRIGATION AND DEBRIDEMENT FOOT [name] 01/04/2018   Procedure:  IRRIGATION AND DEBRIDEMENT FOOT;  Surgeon: Samara Deist, DPM;  Location: [facility] ORS;  Service: Podiatry;  Laterality: [name];   LEFT HEART CATH AND CORONARY ANGIOGRAPHY N/A 01/20/2020   Procedure: LEFT HEART CATH AND CORONARY ANGIOGRAPHY;  Surgeon: Wellington Hampshire, MD;  Location: [place] CV LAB;  Service: Cardiovascular;  Laterality: N/A;   LEFT HEART CATH AND CORS/GRAFTS ANGIOGRAPHY N/A 04/28/2017   Procedure: LEFT HEART CATH AND CORONARY ANGIOGRAPHY;  Surgeon: Wellington Hampshire, MD;  Location: [place] CV LAB;  Service: Cardiovascular;  Laterality: N/A;   LOWER EXTREMITY ANGIOGRAPHY [name] 03/03/2018   Procedure: LOWER EXTREMITY ANGIOGRAPHY;  Surgeon: Katha Cabal, MD;  Location: [place] CV LAB;  Service: Cardiovascular;  Laterality: [name];   LOWER EXTREMITY ANGIOGRAPHY Left 08/24/2018   Procedure: LOWER EXTREMITY ANGIOGRAPHY;  Surgeon: Algernon Huxley, MD;  Location: [place] CV LAB;  Service: Cardiovascular;  Laterality:  Left;   LOWER EXTREMITY ANGIOGRAPHY Left 06/14/2019   Procedure: LOWER EXTREMITY ANGIOGRAPHY;  Surgeon: Algernon Huxley, MD;  Location: [place] CV LAB;  Service: Cardiovascular;  Laterality: Left;   LOWER EXTREMITY ANGIOGRAPHY Left 08/02/2019   Procedure: LOWER EXTREMITY ANGIOGRAPHY;  Surgeon: Algernon Huxley, MD;  Location: [place] CV LAB;  Service: Cardiovascular;  Laterality: Left;   LOWER EXTREMITY ANGIOGRAPHY Left 07/17/2020   Procedure: LOWER EXTREMITY ANGIOGRAPHY;  Surgeon: Algernon Huxley, MD;  Location: [place] CV LAB;  Service: Cardiovascular;  Laterality: Left;   PERIPHERAL ARTERIAL STENT GRAFT     x2 left/[name]   PERIPHERAL VASCULAR BALLOON ANGIOPLASTY [name] 01/06/2018   Procedure: PERIPHERAL VASCULAR BALLOON ANGIOPLASTY;  Surgeon: Katha Cabal, MD;  Location: [place] CV LAB;  Service: Cardiovascular;  Laterality: [name];   PERIPHERAL VASCULAR CATHETERIZATION N/A 07/06/2015   Procedure: Abdominal Aortogram w/Lower Extremity;  Surgeon: Algernon Huxley, MD;  Location: [place] CV LAB;  Service: Cardiovascular;  Laterality: N/A;   PERIPHERAL VASCULAR CATHETERIZATION  07/06/2015   Procedure: Lower Extremity Intervention;  Surgeon: Algernon Huxley, MD;  Location: [place] CV LAB;  Service: Cardiovascular;;     Current Meds  Medication Sig   albuterol (VENTOLIN HFA) 108 (90 Base) MCG/ACT inhaler Inhale into the lungs every 6 (six) hours as needed for wheezing or shortness of breath.   apixaban (ELIQUIS) 5 MG TABS tablet Take 5 mg by mouth 2 (two) times daily.   atorvastatin (LIPITOR) 80 MG tablet Take 1 tablet (80 mg total) by mouth daily.   carvedilol (COREG) 3.125 MG tablet TAKE 1 TABLET BY MOUTH TWICE DAILY WITH MEALS   clopidogrel (PLAVIX) 75 MG tablet Take 1 tablet (75 mg total) by mouth daily.   digoxin (LANOXIN) 0.125 MG tablet Take 0.125 mg by mouth daily.   DULoxetine (CYMBALTA) 60 MG capsule Take 60 mg by mouth daily.   ezetimibe (ZETIA) 10 MG tablet Take 1  tablet (10 mg total) by mouth daily.   Fluticasone-Umeclidin-Vilant 100-62.5-25 MCG/INH AEPB Inhale 1 puff into the lungs daily.   gabapentin (NEURONTIN) 800 MG tablet Take 800 mg by mouth 4 (four) times daily.    ipratropium-albuterol (DUONEB) 0.5-2.5 (3) MG/3ML SOLN Inhale 3 mLs into the lungs every 6 (six) hours as needed (wheezing/sob).    isosorbide mononitrate (IMDUR) 30 MG 24 hr tablet Take 1 tablet (30 mg total) by mouth daily.   losartan (COZAAR) 25 MG tablet Take 0.5 tablets (12.5 mg total) by mouth daily.   metFORMIN (GLUCOPHAGE-XR) 500 MG 24 hr tablet Take 1,000 mg by mouth 2 (two)  times daily.   metolazone (ZAROXOLYN) 2.5 MG tablet Take 1 tablet (2.5 mg total) by mouth daily as needed. Take 1/2 hour before morning torsemide   Multiple Vitamin (MULTI-VITAMINS) TABS Take 1 tablet by mouth daily.    nitroGLYCERIN (NITROSTAT) 0.4 MG SL tablet Place 1 tablet (0.4 mg total) under the tongue every 5 (five) minutes as needed for chest pain.   omeprazole (PRILOSEC) 40 MG capsule Take 40 mg by mouth daily.   Oxycodone HCl 10 MG TABS Take 10 mg by mouth every 4 (four) hours as needed (pain).   polyethylene glycol (MIRALAX / GLYCOLAX) 17 g packet Take 17 g by mouth daily as needed for mild constipation.   potassium chloride (KLOR-CON) 10 MEQ tablet Take 1 tablet (10 mEq total) by mouth 2 (two) times daily.   spironolactone (ALDACTONE) 25 MG tablet Take 0.5 tablets (12.5 mg total) by mouth daily.   tamsulosin (FLOMAX) 0.4 MG CAPS capsule Take 1 capsule (0.4 mg total) by mouth daily after supper.   torsemide 40 MG TABS Take 40 mg by mouth 2 (two) times daily. Take 40 mg by mouth twice daily.   traZODone (DESYREL) 150 MG tablet Take 150 mg by mouth at bedtime.   VERQUVO 10 MG TABS TAKE 1 TABLET BY MOUTH ONCE DAILY     Allergies:   Dulaglutide, Ace inhibitors, Other, and Prednisone   Social History   Tobacco Use   Smoking status: Every Day    Packs/day: 1.00    Years: 41.00    Pack years:  41.00    Types: Cigarettes   Smokeless tobacco: Never  Vaping Use   Vaping Use: Never used  Substance Use Topics   Alcohol use: No   Drug use: No     Family Hx: The patient's family history includes Heart attack in his father; Heart disease in his father; Hyperlipidemia in his father and mother; Hypertension in his father and mother.  ROS:   Please see the history of present illness.    Review of Systems  Constitutional: Negative.   HENT: Negative.    Respiratory:  Positive for shortness of breath.   Cardiovascular:  Positive for chest pain.  Gastrointestinal: Negative.   Musculoskeletal: Negative.   Neurological: Negative.   Psychiatric/Behavioral: Negative.    All other systems reviewed and are negative.   Labs/Other Tests and Data Reviewed:    Recent Labs: 08/07/2020: TSH 1.573 03/25/2021: ALT 23 03/29/2021: Hemoglobin 10.9; Platelets 136 03/30/2021: Magnesium 2.0 04/05/2021: B Natriuretic Peptide 1,053.7; BUN 64; Creatinine, Ser 1.71; Potassium 3.9; Sodium 130   Recent Lipid Panel Lab Results  Component Value Date/Time   CHOL 68 03/19/2021 05:05 AM   CHOL 211 (H) 04/05/2013 05:20 AM   TRIG 36 03/19/2021 05:05 AM   TRIG 321 (H) 04/05/2013 05:20 AM   HDL 34 (L) 03/19/2021 05:05 AM   HDL 34 (L) 04/05/2013 05:20 AM   CHOLHDL 2.0 03/19/2021 05:05 AM   LDLCALC 27 03/19/2021 05:05 AM   LDLCALC 113 (H) 04/05/2013 05:20 AM    Wt Readings from Last 3 Encounters:  04/17/21 186 lb (84.4 kg)  04/05/21 194 lb (88 kg)  03/30/21 187 lb 14.4 oz (85.2 kg)     Exam:    Vital Signs: Vital signs may also be detailed in the HPI BP (!) 108/54 (BP Location: [name] Arm, Patient Position: Sitting, Cuff Size: Normal)   Pulse 61   Ht 5\' 7"  (1.702 m)   Wt 186 lb (84.4 kg)   SpO2  96%   BMI 29.13 kg/m  Constitutional:  oriented to person, place, and time. No distress.  HENT:  Head: Grossly normal Eyes:  no discharge. No scleral icterus.  Neck: No JVD, no carotid bruits   Cardiovascular: Irregularly irregular no murmurs appreciated 2+ pitting edema with blistering left lower extremity, [name] leg amputation Pulmonary/Chest: Clear to auscultation bilaterally, no wheezes or rails Abdominal: Soft.  no distension.  no tenderness.  Musculoskeletal: Normal range of motion Neurological:  normal muscle tone. Coordination normal. No atrophy Skin: Skin warm and dry Psychiatric: normal affect, pleasant  ASSESSMENT & PLAN:    Chronic systolic HF (heart failure) (HCC) - High fluid intake, Recommended fluid restriction including [facility].  Large soda in his hands in the office today Rec recommended metolazone 2.5 daily with torsemide 40 twice daily, on this regimen at least he is stable.  He does not want higher doses of torsemide or metolazone but we did discuss taking extra for worsening abdominal distention He already has tight left lower extremity edema Encouraged him to take extra torsemide to get swelling down in the leg BMP today Continue spironolactone, potassium  Coronary artery disease of native artery of native heart with stable angina pectoris (HCC) - Previously declined CABG Prior catheterization, left AMA  Was at [facility], also left AMA Not a good candidate for ischemic work-up given noncompliance issues, leaving AMA  Type 2 diabetes mellitus with foot ulcer, with long-term current use of insulin ([place])  Followed by vascular, amputation on the [name] Exam room smells like smoke today Smoking cessation recommended Drinking [facility] today in the office  Persistent atrial fibrillation Smoking cessation as detailed above on anticoagulation Not a candidate to restore normal sinus rhythm Rate controlled  Atherosclerosis of artery of extremity with ulceration (HCC) -  Followed by vascular, prior stent left SFA Amputation on the [name] Still smoking, cessation recommended Stable  PAD (peripheral artery disease) ([place]) -  smoking cessation  discussed Has severe disease  Mixed hyperlipidemia -  Last cholesterol at goal   Total encounter time more than 35 minutes  Greater than 50% was spent in counseling and coordination of care with the patient   Signed, Ida Rogue, MD  04/17/2021 11:28 AM    [facility] [phone], [place], [place] [address]

## 2021-04-17 ENCOUNTER — Encounter: Payer: Self-pay | Admitting: Cardiovascular Disease

## 2021-04-17 ENCOUNTER — Other Ambulatory Visit: Payer: Self-pay

## 2021-04-17 ENCOUNTER — Ambulatory Visit (INDEPENDENT_AMBULATORY_CARE_PROVIDER_SITE_OTHER): Payer: Medicare Other | Admitting: Cardiovascular Disease

## 2021-04-17 VITALS — BP 108/54 | HR 61 | Ht 67.0 in | Wt 186.0 lb

## 2021-04-17 DIAGNOSIS — I4821 Permanent atrial fibrillation: Secondary | ICD-10-CM

## 2021-04-17 DIAGNOSIS — E1159 Type 2 diabetes mellitus with other circulatory complications: Secondary | ICD-10-CM | POA: Diagnosis not present

## 2021-04-17 DIAGNOSIS — Z79899 Other long term (current) drug therapy: Secondary | ICD-10-CM

## 2021-04-17 DIAGNOSIS — I70219 Atherosclerosis of native arteries of extremities with intermittent claudication, unspecified extremity: Secondary | ICD-10-CM

## 2021-04-17 DIAGNOSIS — E782 Mixed hyperlipidemia: Secondary | ICD-10-CM

## 2021-04-17 DIAGNOSIS — Z720 Tobacco use: Secondary | ICD-10-CM | POA: Diagnosis not present

## 2021-04-17 DIAGNOSIS — I739 Peripheral vascular disease, unspecified: Secondary | ICD-10-CM

## 2021-04-17 DIAGNOSIS — I5022 Chronic systolic (congestive) heart failure: Secondary | ICD-10-CM

## 2021-04-17 DIAGNOSIS — N183 Chronic kidney disease, stage 3 unspecified: Secondary | ICD-10-CM

## 2021-04-17 DIAGNOSIS — I255 Ischemic cardiomyopathy: Secondary | ICD-10-CM

## 2021-04-17 NOTE — Patient Instructions (Addendum)
Medication Instructions:  No changes  If you need a refill on your cardiac medications before your next appointment, please call your pharmacy.   Lab work: [employer]  Testing/Procedures: No new testing needed  Follow-Up: At [facility], you and your health needs are our priority.  As part of our continuing mission to provide you with exceptional heart care, we have created designated Provider Care Teams.  These Care Teams include your primary Cardiologist (physician) and Advanced Practice Providers (APPs -  Physician Assistants and Nurse Practitioners) who all work together to provide you with the care you need, when you need it.  You will need a follow up appointment in 3 months  Providers on your designated Care Team:   Murray Hodgkins, NP Christell Faith, PA-C Cadence Kathlen Mody, [place]  COVID-19 Vaccine Information can be found at: [email or web address].uk For questions related to vaccine distribution or appointments, please email [email or web address] or call [phone].

## 2021-04-18 ENCOUNTER — Telehealth: Payer: Self-pay

## 2021-04-18 LAB — BASIC METABOLIC PANEL
BUN/Creatinine Ratio: 23 — ABNORMAL HIGH (ref 9–20)
BUN: 31 mg/dL — ABNORMAL HIGH (ref 6–24)
CO2: 30 mmol/L — ABNORMAL HIGH (ref 20–29)
Calcium: 9.9 mg/dL (ref 8.7–10.2)
Chloride: 85 mmol/L — ABNORMAL LOW (ref 96–106)
Creatinine, Ser: 1.33 mg/dL — ABNORMAL HIGH (ref 0.76–1.27)
Glucose: 107 mg/dL — ABNORMAL HIGH (ref 70–99)
Potassium: 4.9 mmol/L (ref 3.5–5.2)
Sodium: 129 mmol/L — ABNORMAL LOW (ref 134–144)
eGFR: 62 mL/min/{1.73_m2} (ref >59)

## 2021-04-18 NOTE — Telephone Encounter (Signed)
Able to reach pt's significance other (DPR approved) [name] [name], regarding [name] [name]'s recent lab work, Dr. Rockey Situ had a chance to review their results and advised   "Renal function improved  Would continue with the plan, stay on his current regiment low-dose metolazone torsemide 40 twice daily, extra torsemide to get swelling in the left leg down "  All questions were address and no additional concerns at this time. Agreeable to plan, will call back for anything further.

## 2021-05-08 ENCOUNTER — Other Ambulatory Visit (INDEPENDENT_AMBULATORY_CARE_PROVIDER_SITE_OTHER): Payer: Self-pay | Admitting: Nurse Practitioner

## 2021-05-08 DIAGNOSIS — I70249 Atherosclerosis of native arteries of left leg with ulceration of unspecified site: Secondary | ICD-10-CM

## 2021-05-08 DIAGNOSIS — Z9862 Peripheral vascular angioplasty status: Secondary | ICD-10-CM

## 2021-05-09 ENCOUNTER — Ambulatory Visit (INDEPENDENT_AMBULATORY_CARE_PROVIDER_SITE_OTHER): Payer: Medicare Other | Admitting: Nurse Practitioner

## 2021-05-09 ENCOUNTER — Ambulatory Visit (INDEPENDENT_AMBULATORY_CARE_PROVIDER_SITE_OTHER): Payer: Medicare Other

## 2021-05-09 ENCOUNTER — Other Ambulatory Visit: Payer: Self-pay

## 2021-05-09 VITALS — BP 126/74 | HR 72 | Ht 67.0 in | Wt 171.0 lb

## 2021-05-09 DIAGNOSIS — I890 Lymphedema, not elsewhere classified: Secondary | ICD-10-CM

## 2021-05-09 DIAGNOSIS — Z9862 Peripheral vascular angioplasty status: Secondary | ICD-10-CM | POA: Diagnosis not present

## 2021-05-09 DIAGNOSIS — I70249 Atherosclerosis of native arteries of left leg with ulceration of unspecified site: Secondary | ICD-10-CM

## 2021-05-09 DIAGNOSIS — I70245 Atherosclerosis of native arteries of left leg with ulceration of other part of foot: Secondary | ICD-10-CM

## 2021-05-09 DIAGNOSIS — I10 Essential (primary) hypertension: Secondary | ICD-10-CM

## 2021-05-20 ENCOUNTER — Encounter (INDEPENDENT_AMBULATORY_CARE_PROVIDER_SITE_OTHER): Payer: Self-pay | Admitting: Nurse Practitioner

## 2021-05-20 NOTE — Progress Notes (Signed)
Subjective:    Patient ID: [name] [name], male    DOB: [birth year], 60 y.o.   MRN: [id number] Chief Complaint  Patient presents with   Follow-up    3 mo abi    [name] [name] is a 60 year old male that returns to the office for followup and review of the noninvasive studies. There have been no interval changes in lower extremity symptoms. No interval shortening of the patient's claudication distance or development of rest pain symptoms.  Patient does have a wound on his left leg however it is a skin tear from a traumatic incident.  The patient has a previous known below-knee amputation.  There have been no significant changes to the patient's overall health care.  The patient denies amaurosis fugax or recent TIA symptoms. There are no recent neurological changes noted. The patient denies history of DVT, PE or superficial thrombophlebitis. The patient denies recent episodes of angina or shortness of breath.   ABI Rt=bka and Lt=1.00  (previous ABI's Rt=bka and Lt=0.73) Duplex ultrasound of the lower lower extremity has biphasic tibial artery waveforms with good toe waveforms.   Review of Systems  Cardiovascular:  Negative for leg swelling.  Skin:  Positive for wound.  All other systems reviewed and are negative.     Objective:   Physical Exam Vitals reviewed.  HENT:     Head: Normocephalic.  Cardiovascular:     Rate and Rhythm: Normal rate.     Pulses:          Dorsalis pedis pulses are detected w/ Doppler on the left side.       Posterior tibial pulses are detected w/ Doppler on the left side.     Comments: Unable to palpate pedal pulses Pulmonary:     Effort: Pulmonary effort is normal.  Musculoskeletal:     Right Lower Extremity: Right leg is amputated below knee.  Skin:    General: Skin is warm and dry.  Neurological:     Mental Status: He is alert and oriented to person, place, and time.     Gait: Gait abnormal.  Psychiatric:        Mood and Affect: Mood normal.         Behavior: Behavior normal.        Thought Content: Thought content normal.        Judgment: Judgment normal.    BP 126/74   Pulse 72   Ht 5\' 7"  (1.702 m)   Wt 171 lb (77.6 kg)   BMI 26.78 kg/m   Past Medical History:  Diagnosis Date   Acute on chronic heart failure (HCC)    AKI (acute kidney injury) ([place]) 01/03/2018   Angina at rest [facility]) 08/13/2017   Arthritis    Asthma    Atherosclerosis    BPH (benign prostatic hyperplasia)    Carotid arterial disease (HCC)    Cellulitis of foot, left 09/27/2019   Charcot's joint of foot, right    Chronic combined systolic (congestive) and diastolic (congestive) heart failure ([place])    a. 02/2013 EF 50% by LV gram; b. 04/2017 Echo: EF 25-30%. diff HK. Gr2 DD; c. 06/2020 Echo: EF <20%, glob HK. RVS 53.14mmHg. Sev BAE. Mild to mod TR.   Chronic foot pain (Secondary Area of Pain) (Right) 01/29/2018   COPD (chronic obstructive pulmonary disease) (HCC)    COPD with acute exacerbation ([place]) 01/30/2020   Coronary artery disease    a. 2013 S/P PCI of LAD [facility]);  b. 03/2013  PCI: RCA 90p ( 2.5 x 23 mm DES); c. 02/2014 Cath: patent RCA stent->Med Rx; d. 04/2017 Cath: LM nl, LAd 30ost/p, 8m/d, D1 80ost, LCX 90p/m, OM1 85, RCA 70/20p, 79m, 70d->Referred for CT Surg-felt to be poor candidate; d. 01/2020 Cath: LM nl, LAD 20p/m, 60d. D1 80. LCX 90p/m. LPAV 95, RCA 80/20p, 80p/m.   Diabetic neuropathy ([place])    Diabetic ulcer of right foot ([place])    Hernia    HFrEF (heart failure with reduced ejection fraction) ([place]) 01/30/2020   Hyperlipidemia    Hypertension    Ischemic cardiomyopathy    a. 02/2013 EF 50% by LV gram; b. 04/2017 Echo: EF 25-30%. diff HK. Gr2 DD; c. 06/2020 Echo: EF <20%, glob HK. RVS 53.63mmHg. Sev BAE. Mild to mod TR.   Morbid obesity ([place])    Neuropathy    PAD (peripheral artery disease) ([place])    a. Followed by Dr. Lucky Cowboy; b. 08/2010 Periph Angio: RSFA 70-80p (6X60 self-expanding stent); c. 09/2010 Periph Angio: L SFA 100p (7x unknown length  self-expanding stent); d. 06/2015 Periph Angio: R SFA short segment occlusion (6x12 self-expanding stent).   Persistent atrial fibrillation (HCC)    Restless leg syndrome    Sepsis ([place]) 03/30/2018   Sleep apnea    Subclavian artery stenosis, right (HCC)    Syncope and collapse    Tobacco use    a. 75+ yr hx - still smoking 1ppd, down from 2 ppd.   Type II diabetes mellitus (HCC)    Unstable angina ([place]) 07/11/2016   Varicose vein     Social History   Socioeconomic History   Marital status: Single    Spouse name: Not on file   Number of children: 0   Years of education: 12   Highest education level: 12th grade  Occupational History   Occupation: on disability  Tobacco Use   Smoking status: Every Day    Packs/day: 1.00    Years: 41.00    Pack years: 41.00    Types: Cigarettes   Smokeless tobacco: Never  Vaping Use   Vaping Use: Never used  Substance and Sexual Activity   Alcohol use: No   Drug use: No   Sexual activity: Yes  Other Topics Concern   Not on file  Social History Narrative   Lives at home in [place] with girlfriend. Independent at baseline.   Social Determinants of Health   Financial Resource Strain: Not on file  Food Insecurity: Not on file  Transportation Needs: Not on file  Physical Activity: Not on file  Stress: Not on file  Social Connections: Not on file  Intimate Partner Violence: Not on file    Past Surgical History:  Procedure Laterality Date   ABDOMINAL AORTAGRAM N/A 06/23/2013   Procedure: ABDOMINAL Maxcine Ham;  Surgeon: Wellington Hampshire, MD;  Location: [place] CATH [facility];  Service: Cardiovascular;  Laterality: N/A;   AMPUTATION Right 04/01/2018   Procedure: AMPUTATION BELOW KNEE;  Surgeon: Algernon Huxley, MD;  Location: [facility] ORS;  Service: Vascular;  Laterality: Right;   AMPUTATION TOE Left 08/11/2020   Procedure: AMPUTATION TOE-2nd Toe;  Surgeon: Samara Deist, DPM;  Location: [facility] ORS;  Service: Podiatry;  Laterality: Left;   APPENDECTOMY      CARDIAC CATHETERIZATION  10/14   [facility]; X1 STENT PROXIMAL RCA   CARDIAC CATHETERIZATION  02/2010   [facility]   CARDIAC CATHETERIZATION  02/21/2014   [facility]   CLAVICLE SURGERY     HERNIA REPAIR  IRRIGATION AND DEBRIDEMENT FOOT Right 01/04/2018   Procedure: IRRIGATION AND DEBRIDEMENT FOOT;  Surgeon: Samara Deist, DPM;  Location: [facility] ORS;  Service: Podiatry;  Laterality: Right;   LEFT HEART CATH AND CORONARY ANGIOGRAPHY N/A 01/20/2020   Procedure: LEFT HEART CATH AND CORONARY ANGIOGRAPHY;  Surgeon: Wellington Hampshire, MD;  Location: [place] CV LAB;  Service: Cardiovascular;  Laterality: N/A;   LEFT HEART CATH AND CORS/GRAFTS ANGIOGRAPHY N/A 04/28/2017   Procedure: LEFT HEART CATH AND CORONARY ANGIOGRAPHY;  Surgeon: Wellington Hampshire, MD;  Location: [place] [facility];  Service: Cardiovascular;  Laterality: N/A;   LOWER EXTREMITY ANGIOGRAPHY Right 03/03/2018   Procedure: LOWER EXTREMITY ANGIOGRAPHY;  Surgeon: Katha Cabal, MD;  Location: [place] CV LAB;  Service: Cardiovascular;  Laterality: Right;   LOWER EXTREMITY ANGIOGRAPHY Left 08/24/2018   Procedure: LOWER EXTREMITY ANGIOGRAPHY;  Surgeon: Algernon Huxley, MD;  Location: [place] CV LAB;  Service: Cardiovascular;  Laterality: Left;   LOWER EXTREMITY ANGIOGRAPHY Left 06/14/2019   Procedure: LOWER EXTREMITY ANGIOGRAPHY;  Surgeon: Algernon Huxley, MD;  Location: [place] [facility];  Service: Cardiovascular;  Laterality: Left;   LOWER EXTREMITY ANGIOGRAPHY Left 08/02/2019   Procedure: LOWER EXTREMITY ANGIOGRAPHY;  Surgeon: Algernon Huxley, MD;  Location: [place] [facility];  Service: Cardiovascular;  Laterality: Left;   LOWER EXTREMITY ANGIOGRAPHY Left 07/17/2020   Procedure: LOWER EXTREMITY ANGIOGRAPHY;  Surgeon: Algernon Huxley, MD;  Location: [place] [facility];  Service: Cardiovascular;  Laterality: Left;   PERIPHERAL ARTERIAL STENT GRAFT     x2 left/right   PERIPHERAL VASCULAR BALLOON ANGIOPLASTY Right 01/06/2018   Procedure:  PERIPHERAL VASCULAR BALLOON ANGIOPLASTY;  Surgeon: Katha Cabal, MD;  Location: [place] [facility];  Service: Cardiovascular;  Laterality: Right;   PERIPHERAL VASCULAR CATHETERIZATION N/A 07/06/2015   Procedure: Abdominal Aortogram w/Lower Extremity;  Surgeon: Algernon Huxley, MD;  Location: [place] [facility];  Service: Cardiovascular;  Laterality: N/A;   PERIPHERAL VASCULAR CATHETERIZATION  07/06/2015   Procedure: Lower Extremity Intervention;  Surgeon: Algernon Huxley, MD;  Location: [place] CV LAB;  Service: Cardiovascular;;    Family History  Problem Relation Age of Onset   Heart attack Father    Heart disease Father    Hypertension Father    Hyperlipidemia Father    Hypertension Mother    Hyperlipidemia Mother     Allergies  Allergen Reactions   Dulaglutide Anaphylaxis, Diarrhea and Hives   Ace Inhibitors     hypotension   Other Itching    Skin itching associated with nitro patch   Prednisone Rash    CBC Latest Ref Rng & Units 03/29/2021 03/27/2021 03/26/2021  WBC 4.0 - 10.5 K/uL 5.0 6.9 11.1(H)  Hemoglobin 13.0 - 17.0 g/dL 10.9(L) 10.9(L) 11.6(L)  Hematocrit 39.0 - 52.0 % 32.8(L) 31.7(L) 34.9(L)  Platelets 150 - 400 K/uL 136(L) 120(L) 186      CMP     Component Value Date/Time   NA 129 (L) 04/17/2021 1148   NA 136 06/08/2014 1037   K 4.9 04/17/2021 1148   K 4.3 06/08/2014 1037   CL 85 (L) 04/17/2021 1148   CL 101 06/08/2014 1037   CO2 30 (H) 04/17/2021 1148   CO2 27 06/08/2014 1037   GLUCOSE 107 (H) 04/17/2021 1148   GLUCOSE 93 04/05/2021 1405   GLUCOSE 415 (H) 06/08/2014 1037   BUN 31 (H) 04/17/2021 1148   BUN 9 06/08/2014 1037   CREATININE 1.33 (H) 04/17/2021 1148  CREATININE 0.87 02/05/2018 1417   CALCIUM 9.9 04/17/2021 1148   CALCIUM 9.2 06/08/2014 1037   PROT 6.9 03/25/2021 2005   PROT 6.1 01/29/2018 1602   PROT 7.3 03/07/2014 1955   ALBUMIN 3.9 03/25/2021 2005   ALBUMIN 3.3 (L) 01/29/2018 1602   ALBUMIN 3.9 03/07/2014 1955   AST 34  03/25/2021 2005   AST 17 03/07/2014 1955   ALT 23 03/25/2021 2005   ALT 23 03/07/2014 1955   ALKPHOS 91 03/25/2021 2005   ALKPHOS 105 03/07/2014 1955   BILITOT 1.5 (H) 03/25/2021 2005   BILITOT 0.6 01/29/2018 1602   BILITOT 0.6 03/07/2014 1955   GFRNONAA 46 (L) 04/05/2021 1405   GFRNONAA >60 06/08/2014 1037   GFRNONAA >60 02/17/2014 0909   GFRAA 52 (L) 07/07/2020 1211   GFRAA >60 06/08/2014 1037   GFRAA >60 02/17/2014 0909     VAS [place] ABI WITH/WO TBI  Result Date: 05/10/2021  LOWER EXTREMITY DOPPLER STUDY Patient Name:  [name] [name]  Date of Exam:   05/09/2021 Medical Rec #: [id number]       Accession #:    [id number] Date of Birth: 1960-11-24        Patient Gender: M Patient Age:   3 years Exam Location:  [facility] Procedure:      VAS [place] ABI WITH/WO TBI Referring Phys: --------------------------------------------------------------------------------  Indications: Ulceration, gangrene, peripheral artery disease, and ASO S/P              Intervention.  Vascular Interventions: 07/06/15: Right EIA/CFA PTA with right SFA stent;                         01/06/18: Right SFA/popliteal stent & left EIA                         angioplasty;                         03/03/18: Right SFA stent with right ATA and left EIA                         PTAs;                         08/24/18: Left SFA stent with popliteal, TP trunk & PTA                         angioplasties;                         06/14/19: Left CIA/EIA stents with left SFA, popliteal, TP                         trunk & PTA angioplasties;                         08/02/2019: Aortogram and Selective Left Lower Extermity                         Angiogram including a Selective image of the Left PTA.                         PTA of the Left PTA and Tibioperoneal  trunk. PTA of the                         Prox SFA, Distal SFA and Popliteal Artery. Viabahn stent                         to the Left SFA Distal and Proximal Popliteal Artery. Comparison  Study: 07/05/2020 Performing Technologist: Concha Norway RVT  Examination Guidelines: A complete evaluation includes at minimum, Doppler waveform signals and systolic blood pressure reading at the level of bilateral brachial, anterior tibial, and posterior tibial arteries, when vessel segments are accessible. Bilateral testing is considered an integral part of a complete examination. Photoelectric Plethysmograph (PPG) waveforms and toe systolic pressure readings are included as required and additional duplex testing as needed. Limited examinations for reoccurring indications may be performed as noted.  ABI Findings: +--------+------------------+-----+--------+--------+ Right   Rt Pressure (mmHg)IndexWaveformComment  +--------+------------------+-----+--------+--------+ [id number]                                     +--------+------------------+-----+--------+--------+ +---------+------------------+-----+--------+-------+ Left     Lt Pressure (mmHg)IndexWaveformComment +---------+------------------+-----+--------+-------+ Brachial 129                                    +---------+------------------+-----+--------+-------+ ATA      121                    biphasic.89     +---------+------------------+-----+--------+-------+ PTA      136               1.00 biphasic        +---------+------------------+-----+--------+-------+ Great Toe102               0.75 Normal          +---------+------------------+-----+--------+-------+ +-------+-----------+-----------+------------+------------+ ABI/TBIToday's ABIToday's TBIPrevious ABIPrevious TBI +-------+-----------+-----------+------------+------------+ Right  BKA                                            +-------+-----------+-----------+------------+------------+ Left   1.00       .75        .73         .81          +-------+-----------+-----------+------------+------------+ Left ABIs appear increased compared to  prior study on 06/2020.  Summary: Right: BKA. Left: Resting left ankle-brachial index is within normal range. No evidence of significant left lower extremity arterial disease. The left toe-brachial index is normal.  *See table(s) above for measurements and observations.  Electronically signed by Hortencia Pilar MD on 05/10/2021 at 7:47:29 PM.    Final        Assessment & Plan:   1. Atherosclerosis of native arteries of left leg with ulceration of other part of foot ([place])  Recommend:  The patient has evidence of atherosclerosis of the lower extremities with claudication.  The patient does not voice lifestyle limiting changes at this point in time.  Noninvasive studies do not suggest clinically significant change.  No invasive studies, angiography or surgery at this time The patient should continue walking and begin a more formal exercise program.  The patient should continue antiplatelet therapy and aggressive treatment of the lipid abnormalities  No changes in the patient's medications at this time  The patient should continue wearing graduated compression socks 10-15 mmHg strength to control the mild edema.   Patient return in 6 months with noninvasive studies or sooner if issues arise. 2. Essential hypertension Continue antihypertensive medications as already ordered, these medications have been reviewed and there are no changes at this time.   3. Lymphedema Patient's lower extremity edema is doing well.  There is a small wound but it is more from a skin tear from the traumatic incident versus venous ulcerations.  Patient is advised to continue with directed medical therapy from his heart failure doctors as well as with conservative therapy including compression and elevation.   Current Outpatient Medications on File Prior to Visit  Medication Sig Dispense Refill   albuterol (VENTOLIN HFA) 108 (90 Base) MCG/ACT inhaler Inhale into the lungs every 6 (six) hours as needed for wheezing or  shortness of breath.     apixaban (ELIQUIS) 5 MG TABS tablet Take 5 mg by mouth 2 (two) times daily.     atorvastatin (LIPITOR) 80 MG tablet Take 1 tablet (80 mg total) by mouth daily. 90 tablet 3   carvedilol (COREG) 3.125 MG tablet TAKE 1 TABLET BY MOUTH TWICE DAILY WITH MEALS 180 tablet 0   clopidogrel (PLAVIX) 75 MG tablet Take 1 tablet (75 mg total) by mouth daily. 90 tablet 3   digoxin (LANOXIN) 0.125 MG tablet Take 0.125 mg by mouth daily.     DULoxetine (CYMBALTA) 60 MG capsule Take 60 mg by mouth daily.     ezetimibe (ZETIA) 10 MG tablet Take 1 tablet (10 mg total) by mouth daily. 90 tablet 3   Fluticasone-Umeclidin-Vilant 100-62.5-25 MCG/INH AEPB Inhale 1 puff into the lungs daily.     gabapentin (NEURONTIN) 800 MG tablet Take 800 mg by mouth 4 (four) times daily.      ipratropium-albuterol (DUONEB) 0.5-2.5 (3) MG/3ML SOLN Inhale 3 mLs into the lungs every 6 (six) hours as needed (wheezing/sob).      isosorbide mononitrate (IMDUR) 30 MG 24 hr tablet Take 1 tablet (30 mg total) by mouth daily. 90 tablet 3   losartan (COZAAR) 25 MG tablet Take 0.5 tablets (12.5 mg total) by mouth daily. 46 tablet 3   metFORMIN (GLUCOPHAGE-XR) 500 MG 24 hr tablet Take 1,000 mg by mouth 2 (two) times daily.     metolazone (ZAROXOLYN) 2.5 MG tablet Take 1 tablet (2.5 mg total) by mouth daily as needed. Take 1/2 hour before morning torsemide 10 tablet 0   Multiple Vitamin (MULTI-VITAMINS) TABS Take 1 tablet by mouth daily.      nitroGLYCERIN (NITROSTAT) 0.4 MG SL tablet Place 1 tablet (0.4 mg total) under the tongue every 5 (five) minutes as needed for chest pain. 30 tablet 5   omeprazole (PRILOSEC) 40 MG capsule Take 40 mg by mouth daily.     Oxycodone HCl 10 MG TABS Take 10 mg by mouth every 4 (four) hours as needed (pain).     polyethylene glycol (MIRALAX / GLYCOLAX) 17 g packet Take 17 g by mouth daily as needed for mild constipation. 14 each 0   potassium chloride (KLOR-CON) 10 MEQ tablet Take 1 tablet  (10 mEq total) by mouth 2 (two) times daily. 180 tablet 3   spironolactone (ALDACTONE) 25 MG tablet Take 0.5 tablets (12.5 mg total) by mouth daily. 90 tablet 3   tamsulosin (FLOMAX) 0.4 MG CAPS capsule Take 1 capsule (0.4 mg total) by mouth daily after  supper. 30 capsule 1   traZODone (DESYREL) 150 MG tablet Take 150 mg by mouth at bedtime.     VERQUVO 10 MG TABS TAKE 1 TABLET BY MOUTH ONCE DAILY 30 tablet 5   torsemide 40 MG TABS Take 40 mg by mouth 2 (two) times daily. Take 40 mg by mouth twice daily. 60 tablet 0   No current facility-administered medications on file prior to visit.    There are no Patient Instructions on file for this visit. No follow-ups on file.   Kris Hartmann, NP

## 2021-05-21 NOTE — Progress Notes (Signed)
Patient ID: [name] [name], male    DOB: 08/31/1960, 60 y.o.   MRN: [id number]  HPI  Mr Stiggers is a 60 y/o male with a history of obstructive sleep apnea, PVD, PAD, HTN, hyperlipidemia, DM, CAD, COPD, BPH, asthma, current tobacco use and chronic heart failure.   Echo report from 03/26/21 reviewed and showed an EF of 20-25% along with severely elevated PA pressure and moderate MR. Echo report from 06/17/20 reviewed and showed an EF of <20% along with elevated PA pressure of 53.2 mmHg and mild/ moderate TR. Echo report from 01/20/20 reviewed and showed an EF of <20% along with mild MR and moderate TR. Echo report from 09/29/19 reviewed and showed an EF of 20-25% along with moderately elevated PA pressure, mild MR and mild/moderate TR. Echo report from 11/06/2018 reviewed and showed an EF of 20-25% along with moderate TR and a PA pressure of 59.6 mmHg. Echo report from 04/25/17 reviewed and shows an EF of 25-30% along with a PA pressure of 34 mm Hg. EF has declined from 55-60% back in 2016.   LHC done 01/20/20 showed: Prox RCA-1 lesion is 80% stenosed. Prox RCA-2 lesion is 20% stenosed. Prox RCA to Mid RCA lesion is 80% stenosed. Prox LAD lesion is 20% stenosed. 1st Diag lesion is 80% stenosed. Mid LAD lesion is 20% stenosed. Dist LAD lesion is 60% stenosed. Prox Cx to Mid Cx lesion is 90% stenosed. LPAV lesion is 95% stenosed.   1. Significant underlying three-vessel coronary artery disease. Patent proximal LAD stent with mild in-stent restenosis. Moderate diffuse disease in the distal LAD. Significant stenosis in the proximal left circumflex at the origin of the posterior AV groove artery which is also heavily diseased at the ostium. This is a bifurcation lesion and heavily calcified. RCA stent is patent. However, there is significant proximal disease in the whole mid to distal segment is diffusely diseased and small caliber. 2. Left ventricular angiography was not performed. EF was severe reduced by  echo. 3. Moderately elevated left ventricular end-diastolic pressure at 28 mmHg  Cardiac catheterization done 04/28/17 showed significant three-vessel disease with a patient stent in the RCA and LAD. Significant proximal RCS disease and the stent as well as diffuse mid and distal disease. LAD has moderate disease. Severely elevated left ventricular end-diastolic pressure at 34 mmHg. Possible CABG in the future with optimizing medical management. Stress done in 2015.  Admitted 03/25/21 due to fatigue, chest pain and shortness of breath. Needed levophed due to hypotension. Cardiology consult obtained.  CT angiogram no evidence of dissection,  effusion or PE. Given IV lasix with resultant net loss of >10L. Left AMA after 5 days. Admitted 03/18/21 due to redness of left leg. Hypotensive and placed on vasopressors. Given antibiotics. Wound consult obtained. Elevated troponin thought to be due to demand ischemia. Discharged after 3 days. Was in the ED  01/19/21 due to fall out of bed after feeling light-headed along with feeling short of breath. Initially given IV lasix. Critically high troponin of 299. Was hypoxic on room air into the low-mid 80's. Admission recommended but patient declined.   He presents today for a follow-up visit with a chief complaint of moderate fatigue with minimal exertion. He describes this as chronic in nature having been present for several years. He has associated decreased appetite, cough, shortness of breath, light-headedness, easy bruising, occasional constipation, depression, neck pain and weight loss along with this. He denies any difficulty sleeping, abdominal distention, palpitations, pedal edema, chest pain  or weight gain.   Says that both of his brothers died this year so he has no siblings remaining. Doesn't have any children and admits that he feels depressed at times.   Didn't bring his medications with him and admits that he really doesn't know what he's taking.   Past  Medical History:  Diagnosis Date   Acute on chronic heart failure ([place])    AKI (acute kidney injury) ([place]) 01/03/2018   Angina at rest [facility]) 08/13/2017   Arthritis    Asthma    Atherosclerosis    BPH (benign prostatic hyperplasia)    Carotid arterial disease ([place])    Cellulitis of foot, left 09/27/2019   Charcot's joint of foot, right    Chronic combined systolic (congestive) and diastolic (congestive) heart failure ([place])    a. 02/2013 EF 50% by LV gram; b. 04/2017 Echo: EF 25-30%. diff HK. Gr2 DD; c. 06/2020 Echo: EF <20%, glob HK. RVS 53.23mmHg. Sev BAE. Mild to mod TR.   Chronic foot pain (Secondary Area of Pain) (Right) 01/29/2018   COPD (chronic obstructive pulmonary disease) (HCC)    COPD with acute exacerbation ([place]) 01/30/2020   Coronary artery disease    a. 2013 S/P PCI of LAD [facility]);  b. 03/2013 PCI: RCA 90p ( 2.5 x 23 mm DES); c. 02/2014 Cath: patent RCA stent->Med Rx; d. 04/2017 Cath: LM nl, LAd 30ost/p, 69m/d, D1 80ost, LCX 90p/m, OM1 85, RCA 70/20p, 39m, 70d->Referred for CT Surg-felt to be poor candidate; d. 01/2020 Cath: LM nl, LAD 20p/m, 60d. D1 80. LCX 90p/m. LPAV 95, RCA 80/20p, 80p/m.   Diabetic neuropathy ([place])    Diabetic ulcer of right foot ([place])    Hernia    HFrEF (heart failure with reduced ejection fraction) ([place]) 01/30/2020   Hyperlipidemia    Hypertension    Ischemic cardiomyopathy    a. 02/2013 EF 50% by LV gram; b. 04/2017 Echo: EF 25-30%. diff HK. Gr2 DD; c. 06/2020 Echo: EF <20%, glob HK. RVS 53.71mmHg. Sev BAE. Mild to mod TR.   Morbid obesity ([place])    Neuropathy    PAD (peripheral artery disease) ([place])    a. Followed by Dr. Lucky Cowboy; b. 08/2010 Periph Angio: RSFA 70-80p (6X60 self-expanding stent); c. 09/2010 Periph Angio: L SFA 100p (7x unknown length self-expanding stent); d. 06/2015 Periph Angio: R SFA short segment occlusion (6x12 self-expanding stent).   Persistent atrial fibrillation (HCC)    Restless leg syndrome    Sepsis ([place]) 03/30/2018   Sleep apnea     Subclavian artery stenosis, right (HCC)    Syncope and collapse    Tobacco use    a. 75+ yr hx - still smoking 1ppd, down from 2 ppd.   Type II diabetes mellitus (HCC)    Unstable angina ([place]) 07/11/2016   Varicose vein    Past Surgical History:  Procedure Laterality Date   ABDOMINAL AORTAGRAM N/A 06/23/2013   Procedure: ABDOMINAL Maxcine Ham;  Surgeon: Wellington Hampshire, MD;  Location: [place] CATH LAB;  Service: Cardiovascular;  Laterality: N/A;   AMPUTATION Right 04/01/2018   Procedure: AMPUTATION BELOW KNEE;  Surgeon: Algernon Huxley, MD;  Location: [facility] ORS;  Service: Vascular;  Laterality: Right;   AMPUTATION TOE Left 08/11/2020   Procedure: AMPUTATION TOE-2nd Toe;  Surgeon: Samara Deist, DPM;  Location: [facility] ORS;  Service: Podiatry;  Laterality: Left;   APPENDECTOMY     CARDIAC CATHETERIZATION  10/14   [place]; X1 STENT PROXIMAL RCA   CARDIAC CATHETERIZATION  02/2010  [facility]   CARDIAC CAT[facility]IZATION  02/21/2014   [facility]   CLAVICLE SURGERY     HERNIA REPAIR     IRRIGATION AND DEBRIDEMENT FOOT Right 01/04/2018   Procedure: IRRIGATION AND DEBRIDEMENT FOOT;  Surgeon: Samara Deist, DPM;  Location: [facility] ORS;  Service: Podiatry;  Laterality: Right;   LEFT HEART CATH AND CORONARY ANGIOGRAPHY N/A 01/20/2020   Procedure: LEFT HEART CATH AND CORONARY ANGIOGRAPHY;  Surgeon: Wellington Hampshire, MD;  Location: [place] CV LAB;  Service: Cardiovascular;  Laterality: N/A;   LEFT HEART CATH AND CORS/GRAFTS ANGIOGRAPHY N/A 04/28/2017   Procedure: LEFT HEART CATH AND CORONARY ANGIOGRAPHY;  Surgeon: Wellington Hampshire, MD;  Location: [place] CV LAB;  Service: Cardiovascular;  Laterality: N/A;   LOWER EXTREMITY ANGIOGRAPHY Right 03/03/2018   Procedure: LOWER EXTREMITY ANGIOGRAPHY;  Surgeon: Katha Cabal, MD;  Location: [place] CV LAB;  Service: Cardiovascular;  Laterality: Right;   LOWER EXTREMITY ANGIOGRAPHY Left 08/24/2018   Procedure: LOWER EXTREMITY ANGIOGRAPHY;  Surgeon: Algernon Huxley, MD;   Location: [place] CV LAB;  Service: Cardiovascular;  Laterality: Left;   LOWER EXTREMITY ANGIOGRAPHY Left 06/14/2019   Procedure: LOWER EXTREMITY ANGIOGRAPHY;  Surgeon: Algernon Huxley, MD;  Location: [place] CV LAB;  Service: Cardiovascular;  Laterality: Left;   LOWER EXTREMITY ANGIOGRAPHY Left 08/02/2019   Procedure: LOWER EXTREMITY ANGIOGRAPHY;  Surgeon: Algernon Huxley, MD;  Location: [place] CV LAB;  Service: Cardiovascular;  Laterality: Left;   LOWER EXTREMITY ANGIOGRAPHY Left 07/17/2020   Procedure: LOWER EXTREMITY ANGIOGRAPHY;  Surgeon: Algernon Huxley, MD;  Location: [place] CV LAB;  Service: Cardiovascular;  Laterality: Left;   PERIPHERAL ARTERIAL STENT GRAFT     x2 left/right   PERIPHERAL VASCULAR BALLOON ANGIOPLASTY Right 01/06/2018   Procedure: PERIPHERAL VASCULAR BALLOON ANGIOPLASTY;  Surgeon: Katha Cabal, MD;  Location: [place] CV LAB;  Service: Cardiovascular;  Laterality: Right;   PERIPHERAL VASCULAR CATHETERIZATION N/A 07/06/2015   Procedure: Abdominal Aortogram w/Lower Extremity;  Surgeon: Algernon Huxley, MD;  Location: [place] CV LAB;  Service: Cardiovascular;  Laterality: N/A;   PERIPHERAL VASCULAR CATHETERIZATION  07/06/2015   Procedure: Lower Extremity Intervention;  Surgeon: Algernon Huxley, MD;  Location: [place] CV LAB;  Service: Cardiovascular;;   Family History  Problem Relation Age of Onset   Heart attack Father    Heart disease Father    Hypertension Father    Hyperlipidemia Father    Hypertension Mother    Hyperlipidemia Mother    Social History   Tobacco Use   Smoking status: Every Day    Packs/day: 1.00    Years: 41.00    Pack years: 41.00    Types: Cigarettes   Smokeless tobacco: Never  Substance Use Topics   Alcohol use: No   Allergies  Allergen Reactions   Dulaglutide Anaphylaxis, Diarrhea and Hives   Ace Inhibitors     hypotension   Other Itching    Skin itching associated with nitro patch   Prednisone Rash    Prior to Admission medications   Medication Sig Start Date End Date Taking? Authorizing Provider  albuterol (VENTOLIN HFA) 108 (90 Base) MCG/ACT inhaler Inhale into the lungs every 6 (six) hours as needed for wheezing or shortness of breath.   Yes [provider]  Fluticasone-Umeclidin-Vilant 100-62.5-25 MCG/INH AEPB Inhale 1 puff into the lungs daily.   Yes [provider]  apixaban (ELIQUIS) 5 MG TABS tablet Take 5 mg by mouth 2 (two) times daily.  [provider]  atorvastatin (LIPITOR) 80 MG tablet Take 1 tablet (80 mg total) by mouth daily. 10/09/20   Minna Merritts, MD  carvedilol (COREG) 3.125 MG tablet TAKE 1 TABLET BY MOUTH TWICE DAILY WITH MEALS 04/02/21   Minna Merritts, MD  clopidogrel (PLAVIX) 75 MG tablet Take 1 tablet (75 mg total) by mouth daily. 10/09/20   Minna Merritts, MD  digoxin (LANOXIN) 0.125 MG tablet Take 0.125 mg by mouth daily.    [provider]  DULoxetine (CYMBALTA) 60 MG capsule Take 60 mg by mouth daily.    [provider]  ezetimibe (ZETIA) 10 MG tablet Take 1 tablet (10 mg total) by mouth daily. 10/09/20   Minna Merritts, MD  gabapentin (NEURONTIN) 800 MG tablet Take 800 mg by mouth 4 (four) times daily.  08/14/17 04/04/29  [provider]  ipratropium-albuterol (DUONEB) 0.5-2.5 (3) MG/3ML SOLN Inhale 3 mLs into the lungs every 6 (six) hours as needed (wheezing/sob).     [provider]  isosorbide mononitrate (IMDUR) 30 MG 24 hr tablet Take 1 tablet (30 mg total) by mouth daily. 10/09/20   Minna Merritts, MD  losartan (COZAAR) 25 MG tablet Take 0.5 tablets (12.5 mg total) by mouth daily. 10/09/20   Minna Merritts, MD  metFORMIN (GLUCOPHAGE-XR) 500 MG 24 hr tablet Take 1,000 mg by mouth 2 (two) times daily. 01/02/21   [provider]  metolazone (ZAROXOLYN) 2.5 MG tablet Take 1 tablet (2.5 mg total) by mouth daily as needed. Take 1/2 hour before morning torsemide 04/02/21 07/01/21   Alisa Graff, FNP  Multiple Vitamin (MULTI-VITAMINS) TABS Take 1 tablet by mouth daily.     [provider]  nitroGLYCERIN (NITROSTAT) 0.4 MG SL tablet Place 1 tablet (0.4 mg total) under the tongue every 5 (five) minutes as needed for chest pain. 10/09/20   Minna Merritts, MD  omeprazole (PRILOSEC) 40 MG capsule Take 40 mg by mouth daily.    [provider]  Oxycodone HCl 10 MG TABS Take 10 mg by mouth every 4 (four) hours as needed (pain).    [provider]  polyethylene glycol (MIRALAX / GLYCOLAX) 17 g packet Take 17 g by mouth daily as needed for mild constipation. 08/12/20   Loletha Grayer, MD  potassium chloride (KLOR-CON) 10 MEQ tablet Take 1 tablet (10 mEq total) by mouth 2 (two) times daily. 10/09/20   Minna Merritts, MD  spironolactone (ALDACTONE) 25 MG tablet Take 0.5 tablets (12.5 mg total) by mouth daily. 10/09/20   Minna Merritts, MD  tamsulosin (FLOMAX) 0.4 MG CAPS capsule Take 1 capsule (0.4 mg total) by mouth daily after supper. 07/01/20   Shawna Clamp, MD  torsemide 40 MG TABS Take 40 mg by mouth 2 (two) times daily. Take 40 mg by mouth twice daily. 03/30/21 04/29/21  Sidney Ace, MD  traZODone (DESYREL) 150 MG tablet Take 150 mg by mouth at bedtime. 07/12/19   [provider]  VERQUVO 10 MG TABS TAKE 1 TABLET BY MOUTH ONCE DAILY 11/16/20   Alisa Graff, FNP   Review of Systems  Constitutional:  Positive for appetite change (decreased) and fatigue (moderate).  HENT:  Negative for congestion, rhinorrhea, sore throat and trouble swallowing.   Eyes: Negative.   Respiratory:  Positive for cough (productive) and shortness of breath (minimal).   Cardiovascular:  Negative for chest pain, palpitations and leg swelling.  Gastrointestinal:  Positive for constipation. Negative for abdominal distention  and abdominal pain.  Endocrine: Negative.   Genitourinary: Negative.   Musculoskeletal:  Positive for arthralgias (right shoulder) and  neck pain.  Skin: Negative.   Allergic/Immunologic: Negative.   Neurological:  Positive for light-headedness and numbness (left arm at times). Negative for dizziness.  Hematological:  Negative for adenopathy. Bruises/bleeds easily.  Psychiatric/Behavioral:  Positive for dysphoric mood. Negative for sleep disturbance (sleeping on 2 pillows with HOB elevated). The patient is not nervous/anxious.    Vitals:   05/22/21 1342  BP: (!) 114/52  Pulse: 85  Resp: 16  SpO2: 98%  Weight: 181 lb (82.1 kg)  Height: 5\' 7"  (1.702 m)   Wt Readings from Last 3 Encounters:  05/22/21 181 lb (82.1 kg)  05/09/21 171 lb (77.6 kg)  04/17/21 186 lb (84.4 kg)   Lab Results  Component Value Date   CREATININE 1.33 (H) 04/17/2021   CREATININE 1.71 (H) 04/05/2021   CREATININE 1.17 03/30/2021   Physical Exam Vitals and nursing note reviewed.  Constitutional:      Appearance: Normal appearance.  HENT:     Head: Normocephalic and atraumatic.  Cardiovascular:     Rate and Rhythm: Normal rate and regular rhythm.  Pulmonary:     Effort: Pulmonary effort is normal. No respiratory distress.     Breath sounds: No wheezing or rales.  Abdominal:     General: There is no distension.     Palpations: Abdomen is soft.     Tenderness: There is no abdominal tenderness.  Musculoskeletal:     Cervical back: Normal range of motion and neck supple.     Left lower leg: No tenderness. Edema (trace pitting) present.     Comments: Right lower leg prosthesis  Skin:    General: Skin is warm and dry.     Findings: Bruising (on arms) present.  Neurological:     General: No focal deficit present.     Mental Status: He is alert and oriented to person, place, and time.  Psychiatric:        Mood and Affect: Mood is depressed.        Behavior: Behavior normal.        Thought Content: Thought content normal.    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 and can't stop; doesn't care for the taste of water - saw cardiology Rockey Situ) 04/17/21 - saw pulmonology Raul Del) 10/18/20 - palliative care visit done 08/03/20 - BNP 04/05/21 was 1053.7 - participating in paramedicine program  - dig level 01/25/21 was 0.8  2: HTN- - BP looks good (114/52) - saw PCP Jodi Mourning) 05/08/21 - BMP done 04/17/21 reviewed and shows sodium 129, potassium 4.9, creatinine 1.33 and GFR 62  3: Tobacco use- - currently smoking ~ 3 cigarettes daily - complete cessation discussed for 3 minutes with him  4: Severe atherosclerosis of lower extremities- - had left lower leg angiogram 07/17/20 - saw vascular Owens Shark) 05/09/21 - 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 performed 08/11/20  5: Diabetes- - A1c 04/02/21 was 6.2% - glucose at home today was 90; does report a decreased appetite  6: Neck pain- - is not interested in  going to a pain clinic - encouraged him to call either [facility] or [facility] orthopaedics to see if they need a referral from his PCP - patient says that when he asks his PCP for any referral, she wants to send him to [facility] or [place] as his PCP is in [place] and he says that he's not driving to [facility] or [place]   Patient did not bring his medications nor a list and admits that he's completely unsure of what exactly he's taking. Encouraged to look over our list that we will print and compare it to his actual medications.   Return in 6 months or sooner for any questions/problems before then.

## 2021-05-22 ENCOUNTER — Encounter: Payer: Self-pay | Admitting: Family

## 2021-05-22 ENCOUNTER — Ambulatory Visit: Payer: Medicare Other | Attending: Family | Admitting: Family

## 2021-05-22 ENCOUNTER — Other Ambulatory Visit: Payer: Self-pay

## 2021-05-22 VITALS — BP 114/52 | HR 85 | Resp 16 | Ht 67.0 in | Wt 181.0 lb

## 2021-05-22 DIAGNOSIS — F17210 Nicotine dependence, cigarettes, uncomplicated: Secondary | ICD-10-CM | POA: Insufficient documentation

## 2021-05-22 DIAGNOSIS — M542 Cervicalgia: Secondary | ICD-10-CM

## 2021-05-22 DIAGNOSIS — I5022 Chronic systolic (congestive) heart failure: Secondary | ICD-10-CM

## 2021-05-22 DIAGNOSIS — T82855A Stenosis of coronary artery stent, initial encounter: Secondary | ICD-10-CM | POA: Diagnosis not present

## 2021-05-22 DIAGNOSIS — Z720 Tobacco use: Secondary | ICD-10-CM

## 2021-05-22 DIAGNOSIS — E1151 Type 2 diabetes mellitus with diabetic peripheral angiopathy without gangrene: Secondary | ICD-10-CM | POA: Insufficient documentation

## 2021-05-22 DIAGNOSIS — G4733 Obstructive sleep apnea (adult) (pediatric): Secondary | ICD-10-CM | POA: Insufficient documentation

## 2021-05-22 DIAGNOSIS — I10 Essential (primary) hypertension: Secondary | ICD-10-CM

## 2021-05-22 DIAGNOSIS — I110 Hypertensive heart disease with heart failure: Secondary | ICD-10-CM | POA: Insufficient documentation

## 2021-05-22 DIAGNOSIS — I70209 Unspecified atherosclerosis of native arteries of extremities, unspecified extremity: Secondary | ICD-10-CM | POA: Diagnosis not present

## 2021-05-22 DIAGNOSIS — Z955 Presence of coronary angioplasty implant and graft: Secondary | ICD-10-CM | POA: Insufficient documentation

## 2021-05-22 DIAGNOSIS — E1140 Type 2 diabetes mellitus with diabetic neuropathy, unspecified: Secondary | ICD-10-CM | POA: Insufficient documentation

## 2021-05-22 DIAGNOSIS — J449 Chronic obstructive pulmonary disease, unspecified: Secondary | ICD-10-CM | POA: Diagnosis not present

## 2021-05-22 DIAGNOSIS — I70219 Atherosclerosis of native arteries of extremities with intermittent claudication, unspecified extremity: Secondary | ICD-10-CM

## 2021-05-22 DIAGNOSIS — I2510 Atherosclerotic heart disease of native coronary artery without angina pectoris: Secondary | ICD-10-CM | POA: Diagnosis not present

## 2021-05-22 DIAGNOSIS — Z89511 Acquired absence of right leg below knee: Secondary | ICD-10-CM | POA: Diagnosis not present

## 2021-05-22 DIAGNOSIS — E785 Hyperlipidemia, unspecified: Secondary | ICD-10-CM | POA: Insufficient documentation

## 2021-05-22 DIAGNOSIS — Y838 Other surgical procedures as the cause of abnormal reaction of the patient, or of later complication, without mention of misadventure at the time of the procedure: Secondary | ICD-10-CM | POA: Insufficient documentation

## 2021-05-22 DIAGNOSIS — Z89422 Acquired absence of other left toe(s): Secondary | ICD-10-CM | POA: Diagnosis not present

## 2021-05-22 DIAGNOSIS — E1159 Type 2 diabetes mellitus with other circulatory complications: Secondary | ICD-10-CM

## 2021-05-22 NOTE — Patient Instructions (Addendum)
Continue weighing daily and call for an overnight weight gain of 3 pounds or more or a weekly weight gain of more than 5 pounds.     Call [facility] and [facility] and see if either of them need a referral from your primary care doctor.

## 2021-05-27 ENCOUNTER — Emergency Department: Payer: Medicare Other

## 2021-05-27 ENCOUNTER — Other Ambulatory Visit: Payer: Self-pay

## 2021-05-27 ENCOUNTER — Emergency Department
Admission: EM | Admit: 2021-05-27 | Discharge: 2021-05-27 | Disposition: A | Discharge status: Home / Self Care | Payer: Medicare Other | Attending: Emergency Medicine | Admitting: Emergency Medicine

## 2021-05-27 DIAGNOSIS — I130 Hypertensive heart and chronic kidney disease with heart failure and stage 1 through stage 4 chronic kidney disease, or unspecified chronic kidney disease: Secondary | ICD-10-CM | POA: Insufficient documentation

## 2021-05-27 DIAGNOSIS — S161XXA Strain of muscle, fascia and tendon at neck level, initial encounter: Secondary | ICD-10-CM | POA: Insufficient documentation

## 2021-05-27 DIAGNOSIS — F17210 Nicotine dependence, cigarettes, uncomplicated: Secondary | ICD-10-CM | POA: Diagnosis not present

## 2021-05-27 DIAGNOSIS — I5042 Chronic combined systolic (congestive) and diastolic (congestive) heart failure: Secondary | ICD-10-CM | POA: Diagnosis not present

## 2021-05-27 DIAGNOSIS — J45909 Unspecified asthma, uncomplicated: Secondary | ICD-10-CM | POA: Diagnosis not present

## 2021-05-27 DIAGNOSIS — R519 Headache, unspecified: Secondary | ICD-10-CM | POA: Insufficient documentation

## 2021-05-27 DIAGNOSIS — Z7951 Long term (current) use of inhaled steroids: Secondary | ICD-10-CM | POA: Diagnosis not present

## 2021-05-27 DIAGNOSIS — Z7984 Long term (current) use of oral hypoglycemic drugs: Secondary | ICD-10-CM | POA: Insufficient documentation

## 2021-05-27 DIAGNOSIS — I2510 Atherosclerotic heart disease of native coronary artery without angina pectoris: Secondary | ICD-10-CM | POA: Diagnosis not present

## 2021-05-27 DIAGNOSIS — N1831 Chronic kidney disease, stage 3a: Secondary | ICD-10-CM | POA: Diagnosis not present

## 2021-05-27 DIAGNOSIS — E1122 Type 2 diabetes mellitus with diabetic chronic kidney disease: Secondary | ICD-10-CM | POA: Diagnosis not present

## 2021-05-27 DIAGNOSIS — Y9301 Activity, walking, marching and hiking: Secondary | ICD-10-CM | POA: Diagnosis not present

## 2021-05-27 DIAGNOSIS — W1789XA Other fall from one level to another, initial encounter: Secondary | ICD-10-CM | POA: Insufficient documentation

## 2021-05-27 DIAGNOSIS — S199XXA Unspecified injury of neck, initial encounter: Secondary | ICD-10-CM | POA: Diagnosis present

## 2021-05-27 DIAGNOSIS — Z79899 Other long term (current) drug therapy: Secondary | ICD-10-CM | POA: Diagnosis not present

## 2021-05-27 DIAGNOSIS — J441 Chronic obstructive pulmonary disease with (acute) exacerbation: Secondary | ICD-10-CM | POA: Diagnosis not present

## 2021-05-27 DIAGNOSIS — M25522 Pain in left elbow: Secondary | ICD-10-CM | POA: Insufficient documentation

## 2021-05-27 DIAGNOSIS — W19XXXA Unspecified fall, initial encounter: Secondary | ICD-10-CM

## 2021-05-27 MED ORDER — IPRATROPIUM-ALBUTEROL 0.5-2.5 (3) MG/3ML IN SOLN
3.0000 mL | Freq: Once | RESPIRATORY_TRACT | Status: AC
  Administered 2021-05-27: 17:00:00 3 mL via RESPIRATORY_TRACT
  Filled 2021-05-27: qty 3

## 2021-05-27 MED ORDER — DEXAMETHASONE SODIUM PHOSPHATE 10 MG/ML IJ SOLN
10.0000 mg | Freq: Once | INTRAMUSCULAR | Status: AC
  Administered 2021-05-27: 17:00:00 10 mg via INTRAMUSCULAR
  Filled 2021-05-27: qty 1

## 2021-05-27 MED ORDER — HYDROCODONE-ACETAMINOPHEN 5-325 MG PO TABS
1.0000 | ORAL_TABLET | Freq: Once | ORAL | Status: AC
Start: 2021-05-27 — End: 2021-05-27
  Administered 2021-05-27: 17:00:00 1 via ORAL
  Filled 2021-05-27: qty 1

## 2021-05-27 MED ORDER — METHOCARBAMOL 500 MG PO TABS: 500.0000 mg | ORAL_TABLET | Freq: Three times a day (TID) | ORAL | 0 refills | Status: AC | PRN

## 2021-05-27 NOTE — ED Notes (Signed)
RN brought pt back to room via wheelchair. Pt denies oxygen use at home. RA sats 95%. Pt endorses pain in his neck and leg from fall. Pt denies LOC.

## 2021-05-27 NOTE — ED Triage Notes (Signed)
Pt states that he was stepping [place] the curb at [employer] and fell- pt states he hurt his L elbow- pt denies hitting hit head

## 2021-05-27 NOTE — ED Provider Notes (Signed)
°  Emergency Medicine Provider Triage Evaluation Note  [name] [name] , a 60 y.o.male,  was evaluated in triage.  Pt complains of injury sustained from fall.  Patient states that he was stepping off a curb at [facility] when he fell, hurting his elbow.  Patient denies hitting his head, however he does endorse a headache right now as well as significant neck pain.  Patient states that he is on blood thinners.   Review of Systems  Positive: Headache, neck pain, elbow injury Negative: Denies fever, chest pain, vomiting  Physical Exam   Vitals:   05/27/21 1554  BP: 105/80  Pulse: 71  Resp: 18  Temp: 97.7 F (36.5 C)  SpO2: 96%   Gen:   Awake, no distress   Resp:  Normal effort  MSK:   Moves extremities without difficulty  Other:  Midline cervical tenderness noted.  No gross deformities to the upper extremities.  Full range of motion.  Medical Decision Making  Given the patient's initial medical screening exam, the following diagnostic evaluation has been ordered. The patient will be placed in the appropriate treatment space, once one is available, to complete the evaluation and treatment. I have discussed the plan of care with the patient and I have advised the patient that an ED physician or mid-level practitioner will reevaluate their condition after the test results have been received, as the results may give them additional insight into the type of treatment they may need.    Diagnostics: Head CT, neck CT.  Treatments: none immediately   Teodoro Spray, [place] 05/27/21 1658    Lucrezia Starch, MD 05/27/21 [phone]

## 2021-05-27 NOTE — Discharge Instructions (Addendum)
Take tylenol 1000 mg every 6 hours as needed for mild to moderate pain.  Take the robaxin (muscle relaxant) for muscle spasms/stiffness  Take your oxycodone as prescribed.  Continue your home albuterol/breathing treatments.  Return to the Er as needed.

## 2021-05-27 NOTE — ED Triage Notes (Signed)
Pt in via EMS from the [name]. Pt fell and has laceration to right arm. Pt is BKA to right side. Pt 70% on RA and was placed on 2L. Pt sats now 91%. Pt with hx of COPD. Pt is not on home oxygen. 126/74, CBG 170, NKDA. Pt c/o pain to right shoulder.

## 2021-05-27 NOTE — ED Provider Notes (Signed)
[facility] Emergency Department Provider Note  ____________________________________________   Event Date/Time   First MD Initiated Contact with Patient 05/27/21 1655     (approximate)  I have reviewed the triage vital signs and the nursing notes.   HISTORY  Chief Complaint Fall    HPI [name] [name] is a 60 y.o. male  with h/o severe COPD, CAD, CHF, here with fall. Pt was out to eat today when he fell walking out of a restaurant. Reports he landed on to his left elbow then right elbow. Felt his neck "jostled" with the fall and now has moderate aching bilateral neck pain. Pt has since had aching pain, along with a sharp pain along L elbow from skin tear though denies any pain with rOM or palpation. Pt reports he was in his usual state of health until the fall. No other recent illness. Denies any recent cough, SOB, or other changes in his health. No fever, chills. He has a slight wheeze but reports he is due for a breathing tx anyways. No other complaints. No focal numbness, weakness, esp no hand tingling or paresthesias.        Past Medical History:  Diagnosis Date   Acute on chronic heart failure ([place])    AKI (acute kidney injury) ([place]) 01/03/2018   Angina at rest [facility]) 08/13/2017   Arthritis    Asthma    Atherosclerosis    BPH (benign prostatic hyperplasia)    Carotid arterial disease ([place])    Cellulitis of foot, left 09/27/2019   Charcot's joint of foot, right    Chronic combined systolic (congestive) and diastolic (congestive) heart failure ([place])    a. 02/2013 EF 50% by LV gram; b. 04/2017 Echo: EF 25-30%. diff HK. Gr2 DD; c. 06/2020 Echo: EF <20%, glob HK. RVS 53.9mmHg. Sev BAE. Mild to mod TR.   Chronic foot pain (Secondary Area of Pain) (Right) 01/29/2018   COPD (chronic obstructive pulmonary disease) (HCC)    COPD with acute exacerbation ([place]) 01/30/2020   Coronary artery disease    a. 2013 S/P PCI of LAD [facility]);  b. 03/2013 PCI: RCA 90p ( 2.5  x 23 mm DES); c. 02/2014 Cath: patent RCA stent->Med Rx; d. 04/2017 Cath: LM nl, LAd 30ost/p, 60m/d, D1 80ost, LCX 90p/m, OM1 85, RCA 70/20p, 77m, 70d->Referred for CT Surg-felt to be poor candidate; d. 01/2020 Cath: LM nl, LAD 20p/m, 60d. D1 80. LCX 90p/m. LPAV 95, RCA 80/20p, 80p/m.   Diabetic neuropathy ([place])    Diabetic ulcer of right foot ([place])    Hernia    HFrEF (heart failure with reduced ejection fraction) ([place]) 01/30/2020   Hyperlipidemia    Hypertension    Ischemic cardiomyopathy    a. 02/2013 EF 50% by LV gram; b. 04/2017 Echo: EF 25-30%. diff HK. Gr2 DD; c. 06/2020 Echo: EF <20%, glob HK. RVS 53.48mmHg. Sev BAE. Mild to mod TR.   Morbid obesity ([place])    Neuropathy    PAD (peripheral artery disease) ([place])    a. Followed by Dr. Lucky Cowboy; b. 08/2010 Periph Angio: RSFA 70-80p (6X60 self-expanding stent); c. 09/2010 Periph Angio: L SFA 100p (7x unknown length self-expanding stent); d. 06/2015 Periph Angio: R SFA short segment occlusion (6x12 self-expanding stent).   Persistent atrial fibrillation (HCC)    Restless leg syndrome    Sepsis ([place]) 03/30/2018   Sleep apnea    Subclavian artery stenosis, right (HCC)    Syncope and collapse    Tobacco use  a. 75+ yr hx - still smoking 1ppd, down from 2 ppd.   Type II diabetes mellitus ([place])    Unstable angina ([place]) 07/11/2016   Varicose vein     Patient Active Problem List   Diagnosis Date Noted   Permanent atrial fibrillation ([place])    Acute on chronic HFrEF (heart failure with reduced ejection fraction) ([place]) 03/26/2021   Cardiogenic shock ([place]) 03/26/2021   Thrombocytopenia ([place]) 03/21/2021   Cellulitis of left lower extremity 03/18/2021   Chronic ulcer of left leg ([place]) 03/18/2021   Hypotension 03/18/2021   BPH (benign prostatic hyperplasia)    Depression    Stage 3a chronic kidney disease (CKD) (HCC)    Elevated troponin    Acquired absence of right leg below knee ([place]) 08/22/2020   Pyogenic inflammation of bone ([place])    Chronic  a-fib ([place])    Demand ischemia ([place])    Palliative care by specialist    CHF (congestive heart failure) ([place]) 06/18/2020   Chronic anticoagulation 06/17/2020   Chronic venous insufficiency 02/10/2020   COPD with acute exacerbation ([place]) 01/30/2020   Persistent atrial fibrillation ([place]) 01/19/2020   Chronic bilateral low back pain with sciatica 10/08/2019   Medication management contract signed 10/08/2019   Hyponatremia 09/27/2019   CAD (coronary artery disease) 08/31/2019   Acute on chronic systolic CHF (congestive heart failure) ([place]) 11/05/2018   Open toe wound 10/01/2018   Venous ulcer of left leg ([place]) 08/19/2018   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[place]) 04/28/2018   Abnormal MRI, shoulder (Right) 02/23/2018   Abnormal MRI, cervical spine (2016) 02/23/2018   DDD (degenerative disc disease), cervical 02/23/2018   Cervical foraminal stenosis (Bilateral) 02/23/2018   Cervical central spinal stenosis 02/23/2018   Cervical facet hypertrophy 02/23/2018   Arthralgia of acromioclavicular joint (Right) 02/23/2018   Osteoarthritis of  AC (acromioclavicular) joint (Right) 02/23/2018   Biceps tendinosis of shoulder (Right) 02/23/2018   Tendinopathy of rotator cuff (Right) 02/23/2018   Vitamin D deficiency 02/23/2018   Atherosclerosis of native arteries of the extremities with ulceration ([place]) 02/15/2018   Peripheral edema 02/05/2018   Status post peripherally inserted central catheter (PICC) central line placement 02/04/2018   Chronic lower extremity pain (Primary Area of Pain) (Right) 01/29/2018   Chronic shoulder pain (Tertiary Area of Pain) (Right) 01/29/2018   Chronic pain syndrome 01/29/2018   Long term current use of opiate analgesic 01/29/2018   Medication monitoring encounter 01/29/2018   Disorder of skeletal system 01/29/2018   Problems influencing health status 01/29/2018   AKI (acute kidney injury) ([place]) 01/03/2018   NSTEMI (non-ST elevated myocardial infarction)  ([place]) 11/21/2017   HLD (hyperlipidemia) 09/10/2017   Angina pectoris ([place]) 09/08/2017   Lymphedema 40/97/3532   Chronic systolic CHF (congestive heart failure) ([place]) 04/23/2017   Chest pain 07/11/2016   Foot ulcer (Right)    Stable angina (HCC)    Pressure injury of skin 05/06/2016   Diabetic foot infection ([place]) 05/05/2016   Type 2 diabetes mellitus with hyperlipidemia ([place]) 01/26/2016   Cellulitis 11/21/2015   Morbid obesity ([place]) 11/17/2014   PAD (peripheral artery disease) ([place])    Hypertension    Tobacco use    COPD (chronic obstructive pulmonary disease) ([place]) 05/19/2012   Diabetic neuropathy ([place]) 05/19/2012   PVD (peripheral vascular disease) ([place]) 03/27/2012    Past Surgical History:  Procedure Laterality Date   ABDOMINAL AORTAGRAM N/A 06/23/2013   Procedure: ABDOMINAL Maxcine Ham;  Surgeon: Wellington Hampshire, MD;  Location: [facility] CATH LAB;  Service: Cardiovascular;  Laterality: N/A;   AMPUTATION Right 04/01/2018   Procedure: AMPUTATION BELOW KNEE;  Surgeon: Algernon Huxley, MD;  Location: [facility] ORS;  Service: Vascular;  Laterality: Right;   AMPUTATION TOE Left 08/11/2020   Procedure: AMPUTATION TOE-2nd Toe;  Surgeon: Samara Deist, DPM;  Location: [facility] ORS;  Service: Podiatry;  Laterality: Left;   APPENDECTOMY     CARDIAC CATHETERIZATION  10/14   [facility]; X1 STENT PROXIMAL RCA   CARDIAC CATHETERIZATION  02/2010   [facility]   CARDIAC CAT[facility]IZATION  02/21/2014   [facility]   CLAVICLE SURGERY     HERNIA REPAIR     IRRIGATION AND DEBRIDEMENT FOOT Right 01/04/2018   Procedure: IRRIGATION AND DEBRIDEMENT FOOT;  Surgeon: Samara Deist, DPM;  Location: [facility] ORS;  Service: Podiatry;  Laterality: Right;   LEFT HEART CATH AND CORONARY ANGIOGRAPHY N/A 01/20/2020   Procedure: LEFT HEART CATH AND CORONARY ANGIOGRAPHY;  Surgeon: Wellington Hampshire, MD;  Location: [facility] CV LAB;  Service: Cardiovascular;  Laterality: N/A;   LEFT HEART CATH AND CORS/GRAFTS ANGIOGRAPHY N/A 04/28/2017   Procedure: LEFT  HEART CATH AND CORONARY ANGIOGRAPHY;  Surgeon: Wellington Hampshire, MD;  Location: [facility] CV LAB;  Service: Cardiovascular;  Laterality: N/A;   LOWER EXTREMITY ANGIOGRAPHY Right 03/03/2018   Procedure: LOWER EXTREMITY ANGIOGRAPHY;  Surgeon: Katha Cabal, MD;  Location: [facility] CV LAB;  Service: Cardiovascular;  Laterality: Right;   LOWER EXTREMITY ANGIOGRAPHY Left 08/24/2018   Procedure: LOWER EXTREMITY ANGIOGRAPHY;  Surgeon: Algernon Huxley, MD;  Location: [place] CV LAB;  Service: Cardiovascular;  Laterality: Left;   LOWER EXTREMITY ANGIOGRAPHY Left 06/14/2019   Procedure: LOWER EXTREMITY ANGIOGRAPHY;  Surgeon: Algernon Huxley, MD;  Location: [place] CV LAB;  Service: Cardiovascular;  Laterality: Left;   LOWER EXTREMITY ANGIOGRAPHY Left 08/02/2019   Procedure: LOWER EXTREMITY ANGIOGRAPHY;  Surgeon: Algernon Huxley, MD;  Location: [place] CV LAB;  Service: Cardiovascular;  Laterality: Left;   LOWER EXTREMITY ANGIOGRAPHY Left 07/17/2020   Procedure: LOWER EXTREMITY ANGIOGRAPHY;  Surgeon: Algernon Huxley, MD;  Location: [place] CV LAB;  Service: Cardiovascular;  Laterality: Left;   PERIPHERAL ARTERIAL STENT GRAFT     x2 left/right   PERIPHERAL VASCULAR BALLOON ANGIOPLASTY Right 01/06/2018   Procedure: PERIPHERAL VASCULAR BALLOON ANGIOPLASTY;  Surgeon: Katha Cabal, MD;  Location: [place] CV LAB;  Service: Cardiovascular;  Laterality: Right;   PERIPHERAL VASCULAR CATHETERIZATION N/A 07/06/2015   Procedure: Abdominal Aortogram w/Lower Extremity;  Surgeon: Algernon Huxley, MD;  Location: [place] CV LAB;  Service: Cardiovascular;  Laterality: N/A;   PERIPHERAL VASCULAR CATHETERIZATION  07/06/2015   Procedure: Lower Extremity Intervention;  Surgeon: Algernon Huxley, MD;  Location: [place] [facility] CV LAB;  Service: Cardiovascular;;    Prior to Admission medications   Medication Sig Start Date End Date Taking? Authorizing Provider  albuterol (VENTOLIN HFA) 108 (90 Base)  MCG/ACT inhaler Inhale into the lungs every 6 (six) hours as needed for wheezing or shortness of breath.    [provider]  apixaban (ELIQUIS) 5 MG TABS tablet Take 5 mg by mouth 2 (two) times daily.    [provider]  atorvastatin (LIPITOR) 80 MG tablet Take 1 tablet (80 mg total) by mouth daily. 10/09/20   Minna Merritts, MD  carvedilol (COREG) 3.125 MG tablet TAKE 1 TABLET BY MOUTH TWICE DAILY WITH MEALS 04/02/21   Minna Merritts, MD  clopidogrel (PLAVIX) 75 MG tablet Take 1 tablet (75 mg total)  by mouth daily. 10/09/20   Minna Merritts, MD  digoxin (LANOXIN) 0.125 MG tablet Take 0.125 mg by mouth daily.    [provider]  DULoxetine (CYMBALTA) 60 MG capsule Take 60 mg by mouth daily.    [provider]  ezetimibe (ZETIA) 10 MG tablet Take 1 tablet (10 mg total) by mouth daily. 10/09/20   Minna Merritts, MD  Fluticasone-Umeclidin-Vilant 100-62.5-25 MCG/INH AEPB Inhale 1 puff into the lungs daily.    [provider]  gabapentin (NEURONTIN) 800 MG tablet Take 800 mg by mouth 4 (four) times daily.  08/14/17 04/04/29  [provider]  ipratropium-albuterol (DUONEB) 0.5-2.5 (3) MG/3ML SOLN Inhale 3 mLs into the lungs every 6 (six) hours as needed (wheezing/sob).     [provider]  isosorbide mononitrate (IMDUR) 30 MG 24 hr tablet Take 1 tablet (30 mg total) by mouth daily. 10/09/20   Minna Merritts, MD  losartan (COZAAR) 25 MG tablet Take 0.5 tablets (12.5 mg total) by mouth daily. 10/09/20   Minna Merritts, MD  metFORMIN (GLUCOPHAGE-XR) 500 MG 24 hr tablet Take 1,000 mg by mouth 2 (two) times daily. 01/02/21   [provider]  metolazone (ZAROXOLYN) 2.5 MG tablet Take 1 tablet (2.5 mg total) by mouth daily as needed. Take 1/2 hour before morning torsemide 04/02/21 07/01/21  Alisa Graff, FNP  Multiple Vitamin (MULTI-VITAMINS) TABS Take 1 tablet by mouth daily.     [provider]  nitroGLYCERIN (NITROSTAT) 0.4  MG SL tablet Place 1 tablet (0.4 mg total) under the tongue every 5 (five) minutes as needed for chest pain. 10/09/20   Minna Merritts, MD  omeprazole (PRILOSEC) 40 MG capsule Take 40 mg by mouth daily.    [provider]  Oxycodone HCl 10 MG TABS Take 10 mg by mouth every 4 (four) hours as needed (pain).    [provider]  polyethylene glycol (MIRALAX / GLYCOLAX) 17 g packet Take 17 g by mouth daily as needed for mild constipation. 08/12/20   Loletha Grayer, MD  potassium chloride (KLOR-CON) 10 MEQ tablet Take 1 tablet (10 mEq total) by mouth 2 (two) times daily. 10/09/20   Minna Merritts, MD  spironolactone (ALDACTONE) 25 MG tablet Take 0.5 tablets (12.5 mg total) by mouth daily. 10/09/20   Minna Merritts, MD  tamsulosin (FLOMAX) 0.4 MG CAPS capsule Take 1 capsule (0.4 mg total) by mouth daily after supper. 07/01/20   Shawna Clamp, MD  torsemide 40 MG TABS Take 40 mg by mouth 2 (two) times daily. Take 40 mg by mouth twice daily. 03/30/21 04/29/21  Sidney Ace, MD  traZODone (DESYREL) 150 MG tablet Take 150 mg by mouth at bedtime. 07/12/19   [provider]  VERQUVO 10 MG TABS TAKE 1 TABLET BY MOUTH ONCE DAILY 11/16/20   Darylene Price A, FNP    Allergies Dulaglutide, Ace inhibitors, Other, and Prednisone  Family History  Problem Relation Age of Onset   Heart attack Father    Heart disease Father    Hypertension Father    Hyperlipidemia Father    Hypertension Mother    Hyperlipidemia Mother     Social History Social History   Tobacco Use   Smoking status: Every Day    Packs/day: 1.00    Years: 41.00    Pack years: 41.00    Types: Cigarettes   Smokeless tobacco: Never  Vaping Use   Vaping Use: Never used  Substance Use Topics  Alcohol use: No   Drug use: No    Review of Systems  Review of Systems  Constitutional:  Negative for chills, fatigue and fever.  HENT:  Negative for sore throat.   Respiratory:  Negative for shortness of breath.    Cardiovascular:  Negative for chest pain.  Gastrointestinal:  Negative for abdominal pain.  Genitourinary:  Negative for flank pain.  Musculoskeletal:  Positive for neck pain.  Skin:  Negative for rash and wound.  Allergic/Immunologic: Negative for immunocompromised state.  Neurological:  Positive for headaches. Negative for weakness and numbness.  Hematological:  Does not bruise/bleed easily.    ____________________________________________  PHYSICAL EXAM:      VITAL SIGNS: ED Triage Vitals  Enc Vitals Group     BP 05/27/21 1554 105/80     Pulse Rate 05/27/21 1554 71     Resp 05/27/21 1554 18     Temp 05/27/21 1554 97.7 F (36.5 C)     Temp Source 05/27/21 1554 Oral     SpO2 05/27/21 1554 96 %     Weight 05/27/21 1552 160 lb (72.6 kg)     Height 05/27/21 1552 5\' 7"  (1.702 m)     Head Circumference --      Peak Flow --      Pain Score 05/27/21 1552 8     Pain Loc --      Pain Edu? --      Excl. in [place]? --      Physical Exam Vitals and nursing note reviewed.  Constitutional:      General: He is not in acute distress.    Appearance: He is well-developed.  HENT:     Head: Normocephalic and atraumatic.  Eyes:     Conjunctiva/sclera: Conjunctivae normal.  Neck:     Comments: Mild paraspinal TTP over lower cervical neck, slight increased muscle tone but no midline TTP. Cardiovascular:     Rate and Rhythm: Normal rate and regular rhythm.     Heart sounds: Normal heart sounds.  Pulmonary:     Effort: Pulmonary effort is normal. No respiratory distress.     Breath sounds: Wheezing (scant, expiratory with normal WOB) present.  Abdominal:     General: There is no distension.  Musculoskeletal:     Cervical back: Neck supple.  Skin:    General: Skin is warm.     Capillary Refill: Capillary refill takes less than 2 seconds.     Findings: No rash.  Neurological:     Mental Status: He is alert and oriented to person, place, and time.     Motor: No abnormal muscle tone.      Comments: Strength 5/5 bl UE and LE. Normal sensation to light touch.      ____________________________________________   LABS (all labs ordered are listed, but only abnormal results are displayed)  Labs Reviewed - No data to display  ____________________________________________  EKG:  ________________________________________  RADIOLOGY All imaging, including plain films, CT scans, and ultrasounds, independently reviewed by me, and interpretations confirmed via formal radiology reads.  ED MD interpretation:   CT Head: NAICA CT Spine Cervical: No acute abnormality CXR: Clear  Official radiology report(s): CT Head Wo Contrast  Result Date: 05/27/2021 CLINICAL DATA:  Fall. EXAM: CT HEAD WITHOUT CONTRAST CT CERVICAL SPINE WITHOUT CONTRAST TECHNIQUE: Multidetector CT imaging of the head and cervical spine was performed following the standard protocol without intravenous contrast. Multiplanar CT image reconstructions of the cervical spine were also generated. COMPARISON:  03/25/2021  FINDINGS: CT HEAD FINDINGS Brain: No evidence of acute infarction, hemorrhage, hydrocephalus, extra-axial collection or mass lesion/mass effect. Vascular: No hyperdense vessel or unexpected calcification. Skull: Normal. Negative for fracture or focal lesion. Sinuses/Orbits: Globes and orbits are unremarkable. Sinuses are clear. Other: None. CT CERVICAL SPINE FINDINGS Alignment: Normal. Skull base and vertebrae: No acute fracture. No primary bone lesion or focal pathologic process. Soft tissues and spinal canal: No prevertebral fluid or swelling. No visible canal hematoma. Disc levels: Discs are relatively well maintained in height. No significant disc bulging. No evidence of a disc herniation. No stenosis. Upper chest: No acute findings. Significant aortic atherosclerotic calcifications. Lung apices are clear. Other: None. IMPRESSION: HEAD CT 1. No acute intracranial abnormalities. CERVICAL CT 1. No fracture or  acute finding. Electronically Signed   By: Lajean Manes M.D.   On: 05/27/2021 17:01   CT Cervical Spine Wo Contrast  Result Date: 05/27/2021 CLINICAL DATA:  Fall. EXAM: CT HEAD WITHOUT CONTRAST CT CERVICAL SPINE WITHOUT CONTRAST TECHNIQUE: Multidetector CT imaging of the head and cervical spine was performed following the standard protocol without intravenous contrast. Multiplanar CT image reconstructions of the cervical spine were also generated. COMPARISON:  03/25/2021 FINDINGS: CT HEAD FINDINGS Brain: No evidence of acute infarction, hemorrhage, hydrocephalus, extra-axial collection or mass lesion/mass effect. Vascular: No hyperdense vessel or unexpected calcification. Skull: Normal. Negative for fracture or focal lesion. Sinuses/Orbits: Globes and orbits are unremarkable. Sinuses are clear. Other: None. CT CERVICAL SPINE FINDINGS Alignment: Normal. Skull base and vertebrae: No acute fracture. No primary bone lesion or focal pathologic process. Soft tissues and spinal canal: No prevertebral fluid or swelling. No visible canal hematoma. Disc levels: Discs are relatively well maintained in height. No significant disc bulging. No evidence of a disc herniation. No stenosis. Upper chest: No acute findings. Significant aortic atherosclerotic calcifications. Lung apices are clear. Other: None. IMPRESSION: HEAD CT 1. No acute intracranial abnormalities. CERVICAL CT 1. No fracture or acute finding. Electronically Signed   By: Lajean Manes M.D.   On: 05/27/2021 17:01    ____________________________________________  PROCEDURES   Procedure(s) performed (including Critical Care):  Procedures  ____________________________________________  INITIAL IMPRESSION / MDM / [place] / ED COURSE  As part of my medical decision making, I reviewed the following data within the [place] notes reviewed and incorporated, Old chart reviewed, Notes from prior ED visits, and [place]  Controlled Substance Database       *[name] [name] was evaluated in Emergency Department on 05/27/2021 for the symptoms described in the history of present illness. He was evaluated in the context of the global COVID-19 pandemic, which necessitated consideration that the patient might be at risk for infection with the SARS-CoV-2 virus that causes COVID-19. Institutional protocols and algorithms that pertain to the evaluation of patients at risk for COVID-19 are in a state of rapid change based on information released by regulatory bodies including the CDC and federal and state organizations. These policies and algorithms were followed during the patient's care in the ED.  Some ED evaluations and interventions may be delayed as a result of limited staffing during the pandemic.*     Medical Decision Making: 60 year old male with history of CHF, COPD, here with fall and mild paraspinal neck pain.  Regarding his fall, this was mechanical in nature.  He is adamant that he felt fine prior to and since the fall and he has been ambulatory without difficulty.  CT head and C-spine obtained, reviewed by  me, and showed no acute intracranial abnormalities or acute fracture.  He has no upper extremity weakness, numbness, paresthesias, or signs suggest central cord or other occult cord injury.  Will treat symptomatically.  Of note, patient was initially recorded just hypoxic.  This was likely waveform related and he is satting 90 to 91% on room air when resting.  I reviewed his records and this seems to be a fairly chronic issue for which she has been seen multiple times.  He has a pulmonologist as well as heart failure specialist.  He is adamant he had no recent increase shortness of breath, cough, or other symptoms to suggest pneumonia or CHF exacerbation.  He appears euvolemic on exam.  Will give him a dose of Decadron as this will also help with possible cervical sprain, and encourage routine use of nebulizers at  home. He declines further work-up/evaluation for this at this time.  ____________________________________________  FINAL CLINICAL IMPRESSION(S) / ED DIAGNOSES  Final diagnoses:  Fall, initial encounter  Strain of neck muscle, initial encounter     MEDICATIONS GIVEN DURING THIS VISIT:  Medications  ipratropium-albuterol (DUONEB) 0.5-2.5 (3) MG/3ML nebulizer solution 3 mL (3 mLs Nebulization Given 05/27/21 1724)  HYDROcodone-acetaminophen (NORCO/VICODIN) 5-325 MG per tablet 1 tablet (1 tablet Oral Given 05/27/21 1722)  dexamethasone (DECADRON) injection 10 mg (10 mg Intramuscular Given 05/27/21 1722)  ipratropium-albuterol (DUONEB) 0.5-2.5 (3) MG/3ML nebulizer solution 3 mL (3 mLs Nebulization Given 05/27/21 1724)     ED Discharge Orders     None        Note:  This document was prepared using Dragon voice recognition software and may include unintentional dictation errors.   Duffy Bruce, MD 05/27/21 1736

## 2021-05-30 ENCOUNTER — Telehealth: Payer: Self-pay | Admitting: Primary Care

## 2021-05-30 NOTE — Telephone Encounter (Signed)
T/c to offer booking home visit. No answer, message left to call schedulers.

## 2021-06-06 ENCOUNTER — Telehealth (HOSPITAL_COMMUNITY): Payer: Self-pay

## 2021-06-06 NOTE — Telephone Encounter (Signed)
Contacted [name] [name] to see how he was feeling and to check on his hoveraround. His girlfriend answered and states that he is not acting right.  Spoke to [name] [name] and he is slurring his speech, he did answer a few questions, he did get his hovaround and is excited about it.  He says his neck is still hurting from his fall.  He is slurring his speech, sounds like he dropped the phone and picked it back up.  He states took oxycodone and is very tired and his speech gets this way.  Phone went silence and called his girlfriend back.  She states he has been this way since started doxycycline on Saturday for sinus infection, advsied her to call his PCP and ask them. Attempted to call Palliative nurse but no answer.  Advised her if no better to call 911.  She states he is ok sometimes but other times like this.  She states he controls his meds, advised her to look at his meds when he is sleeping to see what he is taking. She states she will.  She also advised he has been taking a lot of Aleve, advised her to give him tylenol, that Aleve is not good for him.  She states will stop that and get some tylenol.  [name] [name] did states he like his hovaround and has used it a lot in the home. Will check back with him to see if he got checked.   Will continue to visit for heart failure.   [place] [phone]

## 2021-06-12 ENCOUNTER — Other Ambulatory Visit: Payer: Self-pay | Admitting: Cardiovascular Disease

## 2021-06-12 NOTE — Telephone Encounter (Signed)
Please schedule 3 month F/U appointment. Thank you! 

## 2021-06-14 ENCOUNTER — Other Ambulatory Visit: Payer: Self-pay

## 2021-06-14 ENCOUNTER — Inpatient Hospital Stay
Admission: EM | Admit: 2021-06-14 | Discharge: 2021-06-19 | DRG: 291 | Disposition: A | Discharge status: Home / Self Care | Payer: Medicare Other | Attending: Internal Medicine | Admitting: Internal Medicine

## 2021-06-14 ENCOUNTER — Emergency Department: Payer: Medicare Other

## 2021-06-14 DIAGNOSIS — I130 Hypertensive heart and chronic kidney disease with heart failure and stage 1 through stage 4 chronic kidney disease, or unspecified chronic kidney disease: Secondary | ICD-10-CM | POA: Diagnosis not present

## 2021-06-14 DIAGNOSIS — Z7984 Long term (current) use of oral hypoglycemic drugs: Secondary | ICD-10-CM

## 2021-06-14 DIAGNOSIS — Z955 Presence of coronary angioplasty implant and graft: Secondary | ICD-10-CM

## 2021-06-14 DIAGNOSIS — R578 Other shock: Secondary | ICD-10-CM | POA: Diagnosis not present

## 2021-06-14 DIAGNOSIS — F17210 Nicotine dependence, cigarettes, uncomplicated: Secondary | ICD-10-CM | POA: Diagnosis present

## 2021-06-14 DIAGNOSIS — Z8614 Personal history of Methicillin resistant Staphylococcus aureus infection: Secondary | ICD-10-CM

## 2021-06-14 DIAGNOSIS — G8929 Other chronic pain: Secondary | ICD-10-CM | POA: Diagnosis present

## 2021-06-14 DIAGNOSIS — I5023 Acute on chronic systolic (congestive) heart failure: Secondary | ICD-10-CM

## 2021-06-14 DIAGNOSIS — E878 Other disorders of electrolyte and fluid balance, not elsewhere classified: Secondary | ICD-10-CM | POA: Diagnosis present

## 2021-06-14 DIAGNOSIS — I255 Ischemic cardiomyopathy: Secondary | ICD-10-CM | POA: Diagnosis present

## 2021-06-14 DIAGNOSIS — E871 Hypo-osmolality and hyponatremia: Secondary | ICD-10-CM | POA: Diagnosis present

## 2021-06-14 DIAGNOSIS — I4821 Permanent atrial fibrillation: Secondary | ICD-10-CM | POA: Diagnosis present

## 2021-06-14 DIAGNOSIS — W1830XA Fall on same level, unspecified, initial encounter: Secondary | ICD-10-CM | POA: Diagnosis present

## 2021-06-14 DIAGNOSIS — Z79899 Other long term (current) drug therapy: Secondary | ICD-10-CM

## 2021-06-14 DIAGNOSIS — E1169 Type 2 diabetes mellitus with other specified complication: Secondary | ICD-10-CM

## 2021-06-14 DIAGNOSIS — E1142 Type 2 diabetes mellitus with diabetic polyneuropathy: Secondary | ICD-10-CM | POA: Diagnosis present

## 2021-06-14 DIAGNOSIS — E669 Obesity, unspecified: Secondary | ICD-10-CM | POA: Diagnosis present

## 2021-06-14 DIAGNOSIS — J449 Chronic obstructive pulmonary disease, unspecified: Secondary | ICD-10-CM | POA: Diagnosis present

## 2021-06-14 DIAGNOSIS — Z6829 Body mass index (BMI) 29.0-29.9, adult: Secondary | ICD-10-CM

## 2021-06-14 DIAGNOSIS — I248 Other forms of acute ischemic heart disease: Secondary | ICD-10-CM | POA: Diagnosis present

## 2021-06-14 DIAGNOSIS — R778 Other specified abnormalities of plasma proteins: Secondary | ICD-10-CM | POA: Diagnosis not present

## 2021-06-14 DIAGNOSIS — E1151 Type 2 diabetes mellitus with diabetic peripheral angiopathy without gangrene: Secondary | ICD-10-CM | POA: Diagnosis present

## 2021-06-14 DIAGNOSIS — N179 Acute kidney failure, unspecified: Secondary | ICD-10-CM | POA: Diagnosis present

## 2021-06-14 DIAGNOSIS — Z9114 Patient's other noncompliance with medication regimen: Secondary | ICD-10-CM

## 2021-06-14 DIAGNOSIS — I5043 Acute on chronic combined systolic (congestive) and diastolic (congestive) heart failure: Secondary | ICD-10-CM | POA: Diagnosis present

## 2021-06-14 DIAGNOSIS — N400 Enlarged prostate without lower urinary tract symptoms: Secondary | ICD-10-CM | POA: Diagnosis present

## 2021-06-14 DIAGNOSIS — M1990 Unspecified osteoarthritis, unspecified site: Secondary | ICD-10-CM | POA: Diagnosis present

## 2021-06-14 DIAGNOSIS — E1122 Type 2 diabetes mellitus with diabetic chronic kidney disease: Secondary | ICD-10-CM | POA: Diagnosis present

## 2021-06-14 DIAGNOSIS — N182 Chronic kidney disease, stage 2 (mild): Secondary | ICD-10-CM | POA: Diagnosis present

## 2021-06-14 DIAGNOSIS — Z89511 Acquired absence of right leg below knee: Secondary | ICD-10-CM

## 2021-06-14 DIAGNOSIS — J9601 Acute respiratory failure with hypoxia: Secondary | ICD-10-CM | POA: Diagnosis present

## 2021-06-14 DIAGNOSIS — G4733 Obstructive sleep apnea (adult) (pediatric): Secondary | ICD-10-CM | POA: Diagnosis present

## 2021-06-14 DIAGNOSIS — I4510 Unspecified right bundle-branch block: Secondary | ICD-10-CM | POA: Diagnosis present

## 2021-06-14 DIAGNOSIS — I272 Pulmonary hypertension, unspecified: Secondary | ICD-10-CM | POA: Diagnosis present

## 2021-06-14 DIAGNOSIS — I5041 Acute combined systolic (congestive) and diastolic (congestive) heart failure: Secondary | ICD-10-CM | POA: Diagnosis not present

## 2021-06-14 DIAGNOSIS — R570 Cardiogenic shock: Secondary | ICD-10-CM | POA: Diagnosis present

## 2021-06-14 DIAGNOSIS — I2510 Atherosclerotic heart disease of native coronary artery without angina pectoris: Secondary | ICD-10-CM | POA: Diagnosis present

## 2021-06-14 DIAGNOSIS — Z20822 Contact with and (suspected) exposure to covid-19: Secondary | ICD-10-CM | POA: Diagnosis present

## 2021-06-14 DIAGNOSIS — I959 Hypotension, unspecified: Secondary | ICD-10-CM | POA: Diagnosis present

## 2021-06-14 DIAGNOSIS — Z8249 Family history of ischemic heart disease and other diseases of the circulatory system: Secondary | ICD-10-CM

## 2021-06-14 DIAGNOSIS — E785 Hyperlipidemia, unspecified: Secondary | ICD-10-CM | POA: Diagnosis present

## 2021-06-14 DIAGNOSIS — I214 Non-ST elevation (NSTEMI) myocardial infarction: Secondary | ICD-10-CM

## 2021-06-14 DIAGNOSIS — Z7902 Long term (current) use of antithrombotics/antiplatelets: Secondary | ICD-10-CM

## 2021-06-14 DIAGNOSIS — Z89422 Acquired absence of other left toe(s): Secondary | ICD-10-CM

## 2021-06-14 LAB — CBC WITH DIFFERENTIAL/PLATELET
Abs Immature Granulocytes: 0.03 10*3/uL (ref 0.00–0.07)
Basophils Absolute: 0.1 10*3/uL (ref 0.0–0.1)
Basophils Relative: 1 %
Eosinophils Absolute: 0.1 10*3/uL (ref 0.0–0.5)
Eosinophils Relative: 2 %
HCT: 31.4 % — ABNORMAL LOW (ref 39.0–52.0)
Hemoglobin: 10.3 g/dL — ABNORMAL LOW (ref 13.0–17.0)
Immature Granulocytes: 1 %
Lymphocytes Relative: 13 %
Lymphs Abs: 0.7 10*3/uL (ref 0.7–4.0)
MCH: 27.2 pg (ref 26.0–34.0)
MCHC: 32.8 g/dL (ref 30.0–36.0)
MCV: 82.8 fL (ref 80.0–100.0)
Monocytes Absolute: 0.5 10*3/uL (ref 0.1–1.0)
Monocytes Relative: 9 %
Neutro Abs: 4.1 10*3/uL (ref 1.7–7.7)
Neutrophils Relative %: 74 %
Platelets: 173 10*3/uL (ref 150–400)
RBC: 3.79 MIL/uL — ABNORMAL LOW (ref 4.22–5.81)
RDW: 22.1 % — ABNORMAL HIGH (ref 11.5–15.5)
Smear Review: NORMAL
WBC: 5.6 10*3/uL (ref 4.0–10.5)
nRBC: 0 % (ref 0.0–0.2)

## 2021-06-14 LAB — URINALYSIS, ROUTINE W REFLEX MICROSCOPIC
Bilirubin Urine: NEGATIVE
Glucose, UA: NEGATIVE mg/dL
Hgb urine dipstick: NEGATIVE
Ketones, ur: NEGATIVE mg/dL
Leukocytes,Ua: NEGATIVE
Nitrite: NEGATIVE
Protein, ur: NEGATIVE mg/dL
Specific Gravity, Urine: 1.006 (ref 1.005–1.030)
pH: 5 (ref 5.0–8.0)

## 2021-06-14 LAB — LACTIC ACID, PLASMA
Lactic Acid, Venous: 1 mmol/L (ref 0.5–1.9)
Lactic Acid, Venous: <0.3 mmol/L — ABNORMAL LOW (ref 0.5–1.9)

## 2021-06-14 LAB — COMPREHENSIVE METABOLIC PANEL
ALT: 20 U/L (ref 0–44)
AST: 20 U/L (ref 15–41)
Albumin: 3.7 g/dL (ref 3.5–5.0)
Alkaline Phosphatase: 93 U/L (ref 38–126)
Anion gap: 6 (ref 5–15)
BUN: 28 mg/dL — ABNORMAL HIGH (ref 6–20)
CO2: 30 mmol/L (ref 22–32)
Calcium: 8.9 mg/dL (ref 8.9–10.3)
Chloride: 95 mmol/L — ABNORMAL LOW (ref 98–111)
Creatinine, Ser: 1.33 mg/dL — ABNORMAL HIGH (ref 0.61–1.24)
GFR, Estimated: >60 mL/min (ref >60)
Glucose, Bld: 107 mg/dL — ABNORMAL HIGH (ref 70–99)
Potassium: 4.5 mmol/L (ref 3.5–5.1)
Sodium: 131 mmol/L — ABNORMAL LOW (ref 135–145)
Total Bilirubin: 2.1 mg/dL — ABNORMAL HIGH (ref 0.3–1.2)
Total Protein: 7.6 g/dL (ref 6.5–8.1)

## 2021-06-14 LAB — RESP PANEL BY RT-PCR (FLU A&B, COVID) ARPGX2
Influenza A by PCR: NEGATIVE
Influenza B by PCR: NEGATIVE
SARS Coronavirus 2 by RT PCR: NEGATIVE

## 2021-06-14 LAB — PROTIME-INR
INR: 1.2 (ref 0.8–1.2)
INR: 1.3 — ABNORMAL HIGH (ref 0.8–1.2)
Prothrombin Time: 14.9 seconds (ref 11.4–15.2)
Prothrombin Time: 16.5 seconds — ABNORMAL HIGH (ref 11.4–15.2)

## 2021-06-14 LAB — TSH: TSH: 2.617 u[IU]/mL (ref 0.350–4.500)

## 2021-06-14 LAB — PROCALCITONIN: Procalcitonin: <0.1 ng/mL

## 2021-06-14 LAB — HEPARIN LEVEL (UNFRACTIONATED): Heparin Unfractionated: 0.91 IU/mL — ABNORMAL HIGH (ref 0.30–0.70)

## 2021-06-14 LAB — APTT
aPTT: 34 seconds (ref 24–36)
aPTT: 170 seconds (ref 24–36)

## 2021-06-14 LAB — TROPONIN I (HIGH SENSITIVITY)
Troponin I (High Sensitivity): 341 ng/L (ref <18)
Troponin I (High Sensitivity): 352 ng/L (ref <18)

## 2021-06-14 LAB — BRAIN NATRIURETIC PEPTIDE: B Natriuretic Peptide: 2270.8 pg/mL — ABNORMAL HIGH (ref 0.0–100.0)

## 2021-06-14 MED ORDER — METHYLPREDNISOLONE SODIUM SUCC 125 MG IJ SOLR
125.0000 mg | Freq: Once | INTRAMUSCULAR | Status: AC
  Administered 2021-06-14: 125 mg via INTRAVENOUS
  Filled 2021-06-14: qty 2

## 2021-06-14 MED ORDER — FUROSEMIDE 10 MG/ML IJ SOLN
20.0000 mg | Freq: Two times a day (BID) | INTRAMUSCULAR | Status: DC
  Administered 2021-06-14 – 2021-06-19 (×10): 20 mg via INTRAVENOUS
  Filled 2021-06-14 (×2): qty 4
  Filled 2021-06-14 (×2): qty 2
  Filled 2021-06-14 (×6): qty 4

## 2021-06-14 MED ORDER — SODIUM CHLORIDE 0.9 % IV SOLN
1.0000 g | Freq: Once | INTRAVENOUS | Status: AC
  Administered 2021-06-14: 1 g via INTRAVENOUS
  Filled 2021-06-14: qty 10

## 2021-06-14 MED ORDER — POLYETHYLENE GLYCOL 3350 17 G PO PACK: 17.0000 g | PACK | Freq: Every day | ORAL | Status: DC | PRN

## 2021-06-14 MED ORDER — HEPARIN BOLUS VIA INFUSION
4000.0000 [IU] | Freq: Once | INTRAVENOUS | Status: DC
  Filled 2021-06-14: qty 4000

## 2021-06-14 MED ORDER — DOCUSATE SODIUM 100 MG PO CAPS: 100.0000 mg | ORAL_CAPSULE | Freq: Two times a day (BID) | ORAL | Status: DC | PRN

## 2021-06-14 MED ORDER — IPRATROPIUM-ALBUTEROL 0.5-2.5 (3) MG/3ML IN SOLN
6.0000 mL | Freq: Once | RESPIRATORY_TRACT | Status: AC
  Administered 2021-06-14: 6 mL via RESPIRATORY_TRACT
  Filled 2021-06-14: qty 3

## 2021-06-14 MED ORDER — NOREPINEPHRINE 4 MG/250ML-% IV SOLN
0.0000 ug/min | INTRAVENOUS | Status: DC
  Administered 2021-06-14: 2 ug/min via INTRAVENOUS
  Administered 2021-06-14: 3 ug/min via INTRAVENOUS
  Administered 2021-06-14: 4 ug/min via INTRAVENOUS
  Filled 2021-06-14: qty 250

## 2021-06-14 MED ORDER — VANCOMYCIN HCL 2000 MG/400ML IV SOLN
2000.0000 mg | Freq: Once | INTRAVENOUS | Status: AC
  Administered 2021-06-14: 2000 mg via INTRAVENOUS
  Filled 2021-06-14 (×2): qty 400

## 2021-06-14 MED ORDER — HEPARIN SODIUM (PORCINE) 5000 UNIT/ML IJ SOLN
4000.0000 [IU] | Freq: Once | INTRAMUSCULAR | Status: AC
  Administered 2021-06-14: 4000 [IU] via INTRAVENOUS
  Filled 2021-06-14: qty 1

## 2021-06-14 MED ORDER — HEPARIN (PORCINE) 25000 UT/250ML-% IV SOLN
1650.0000 [IU]/h | INTRAVENOUS | Status: DC
  Administered 2021-06-14: 22:00:00 1050 [IU]/h via INTRAVENOUS
  Administered 2021-06-15: 1600 [IU]/h via INTRAVENOUS
  Administered 2021-06-16: 1650 [IU]/h via INTRAVENOUS
  Filled 2021-06-14 (×3): qty 250

## 2021-06-14 MED ORDER — SODIUM CHLORIDE 0.9 % IV BOLUS
1000.0000 mL | Freq: Once | INTRAVENOUS | Status: AC
Start: 2021-06-14 — End: 2021-06-14
  Administered 2021-06-14: 1000 mL via INTRAVENOUS

## 2021-06-14 NOTE — H&P (Signed)
NAME:  [name] [name], MRN:  [id number], DOB:  Oct 05, 1960, LOS: 0 ADMISSION DATE:  06/14/2021, CONSULTATION DATE:  06/14/2021 REFERRING MD: Vladimir Crofts, MD CHIEF COMPLAINT:  SOB   HPI  61 y.o. male with history of CAD, ischemic cardiomyopathy, COPD, hypertension, permanent atrial fibrillation, DM, CKD stage III, HLD, OSA, BPH, Acute on chronic systolic CHF (EF 03-50%),KXFGHWE abuse medication noncompliance, leaving AMA from the hospital who presented to the ED with chief complaints of increased pain redness and swelling of the LLE for the past 2 weeks.  Patient was recently admitted ON 03/30/2021 with cardiogenic shock secondary to acute on chronic CHF but left AMA. He was previously admitted with LLE cellulitis  but report that his leg continues to bother him with purulent discharge and increase erythema. He fell yesterday due to weakness and pain in his leg. Patient also report worsening productive cough with exertional chest discomfort and shortness of breath.  ED Course: On arrival to the ED, he was afebrile with blood pressure 66/55 mm Hg and pulse rate 53 beats/min, RR 14, Sats 100% on R.A. There were no focal neurological deficits; alert and oriented x 4. EKG showed A. fib with a rate of 84 bpm with right bundle and nonspecific ST changes inferiorly and laterally without STEMI criteria, considering his bundle branch block. Pertinent Labs Findings: Na+/ K+: 131/4.5, Glucose: 107, BUN/Cr.:28/1.33, WBC: 5.6, Hgb/Hct: 10.3/31.4, PCT: negative <0.10, Lactic acid: 1.0, COVID PCR: Negative, Troponin: 341, BNP: 2270.8. Patient received fluid challenge bolus of 1L and treated with IV steroids and Duonebs for AECOP. Despite the fluid challenge, patient remained hypotensive so was started on Levophed for possible cardiogenic shock. PCCM consulted for admission.  Significant Hospital Events   1/4: Admitted to ICU with cardiogenic shock  Consults:  Vascular  Significant Diagnostic Tests:  1/5: Chest  Xray>1. No acute abnormality. 2. Stable cardiomegaly. 3. Chronic hyperinflation and peribronchial thickening consistent with provided history of COPD.  Micro Data:  1/5: SARS-CoV-2 PCR> negative 1/5: Influenza PCR> negative 1/5: Blood culture x2> 1/5: Urine Culture> 1/5: MRSA PCR>>   Antimicrobials:  None  OBJECTIVE  Blood pressure (!) 74/52, pulse 62, temperature 98.4 F (36.9 C), temperature source Oral, resp. rate 13, height 5\' 7"  (1.702 m), weight 84.4 kg, SpO2 100 %.        Intake/Output Summary (Last 24 hours) at 06/14/2021 2223 Last data filed at 06/14/2021 2111 Gross per 24 hour  Intake 1400 ml  Output --  Net 1400 ml   Filed Weights   06/14/21 1553  Weight: 84.4 kg     Physical Examination  GENERAL: 61 year-old critically ill patient lying in the bed with no acute distress.  EYES: Pupils equal, round, reactive to light and accommodation. No scleral icterus. Extraocular muscles intact.  HEENT: Head atraumatic, normocephalic. Oropharynx and nasopharynx clear.  NECK:  Supple, no jugular venous distention. No thyroid enlargement, no tenderness.  LUNGS: Normal breath sounds bilaterally, no wheezing, rales,rhonchi or crepitation. No use of accessory muscles of respiration.  CARDIOVASCULAR: S1, S2 normal. No murmurs, rubs, or gallops.  ABDOMEN: Soft, nontender, nondistended. Bowel sounds present. No organomegaly or mass.  EXTREMITIES: RBKA. Left lower extremity as below NEUROLOGIC: Cranial nerves II through XII are intact.  Sensation intact. Gait not checked.  PSYCHIATRIC: The patient is alert and oriented x 3.  SKIN: Generalized bruising and ulcers as below        Labs/imaging that I havepersonally reviewed  (right click and "Reselect all SmartList Selections" daily)  Labs   CBC: Recent Labs  Lab 06/14/21 1557  WBC 5.6  NEUTROABS 4.1  HGB 10.3*  HCT 31.4*  MCV 82.8  PLT 440    Basic Metabolic Panel: Recent Labs  Lab 06/14/21 1557  NA 131*  K  4.5  CL 95*  CO2 30  GLUCOSE 107*  BUN 28*  CREATININE 1.33*  CALCIUM 8.9   GFR: Estimated Creatinine Clearance: 62.9 mL/min (A) (by C-G formula based on SCr of 1.33 mg/dL (H)). Recent Labs  Lab 06/14/21 1557 06/14/21 1855 06/14/21 1908  PROCALCITON  --   --  <0.10  WBC 5.6  --   --   LATICACIDVEN 1.0 <0.3*  --     Liver Function Tests: Recent Labs  Lab 06/14/21 1557  AST 20  ALT 20  ALKPHOS 93  BILITOT 2.1*  PROT 7.6  ALBUMIN 3.7   No results for input(s): LIPASE, AMYLASE in the last 168 hours. No results for input(s): AMMONIA in the last 168 hours.  ABG    Component Value Date/Time   HCO3 36.4 (H) 08/30/2019 2158   O2SAT 79.2 06/21/2020 0602     Coagulation Profile: Recent Labs  Lab 06/14/21 2015  INR 1.2    Cardiac Enzymes: No results for input(s): CKTOTAL, CKMB, CKMBINDEX, TROPONINI in the last 168 hours.  HbA1C: Hemoglobin A1C  Date/Time Value Ref Range Status  04/04/2013 04:16 AM 9.8 (H) 4.2 - 6.3 % Final    Comment:    The American Diabetes Association recommends that a primary goal of therapy should be <7% and that physicians should reevaluate the treatment regimen in patients with HbA1c values consistently >8%.    Hgb A1c MFr Bld  Date/Time Value Ref Range Status  03/18/2021 11:10 AM 6.2 (H) 4.8 - 5.6 % Final    Comment:    (NOTE)         Prediabetes: 5.7 - 6.4         Diabetes: >6.4         Glycemic control for adults with diabetes: <7.0   08/06/2020 04:03 AM 6.5 (H) 4.8 - 5.6 % Final    Comment:    (NOTE) Pre diabetes:          5.7%-6.4%  Diabetes:              >6.4%  Glycemic control for   <7.0% adults with diabetes     CBG: No results for input(s): GLUCAP in the last 168 hours.  Review of Systems:    Review of Systems  Constitutional: Negative.   HENT:  Positive for congestion.   Respiratory:  Positive for cough, sputum production, shortness of breath and wheezing.   Cardiovascular:  Positive for chest pain and  leg swelling.  Gastrointestinal: Negative.   Musculoskeletal:  Positive for back pain, falls and joint pain.  Skin: Negative.   Neurological: Negative.   Psychiatric/Behavioral: Negative.     Past Medical History  He,  has a past medical history of Acute on chronic heart failure ([place]), AKI (acute kidney injury) ([place]) (01/03/2018), Angina at rest [facility]) (08/13/2017), Arthritis, Asthma, Atherosclerosis, BPH (benign prostatic hyperplasia), Carotid arterial disease ([place]), Cellulitis of foot, left (09/27/2019), Charcot's joint of foot, right, Chronic combined systolic (congestive) and diastolic (congestive) heart failure ([place]), Chronic foot pain (Secondary Area of Pain) (Right) (01/29/2018), COPD (chronic obstructive pulmonary disease) ([place]), COPD with acute exacerbation ([place]) (01/30/2020), Coronary artery disease, Diabetic neuropathy ([place]), Diabetic ulcer of right foot ([place]), Hernia, HFrEF (heart failure with reduced  ejection fraction) ([place]) (01/30/2020), Hyperlipidemia, Hypertension, Ischemic cardiomyopathy, Morbid obesity ([place]), Neuropathy, PAD (peripheral artery disease) ([place]), Persistent atrial fibrillation ([place]), Restless leg syndrome, Sepsis ([place]) (03/30/2018), Sleep apnea, Subclavian artery stenosis, right ([place]), Syncope and collapse, Tobacco use, Type II diabetes mellitus ([place]), Unstable angina ([place]) (07/11/2016), and Varicose vein.   Surgical History    Past Surgical History:  Procedure Laterality Date   ABDOMINAL AORTAGRAM N/A 06/23/2013   Procedure: ABDOMINAL Maxcine Ham;  Surgeon: Wellington Hampshire, MD;  Location: [place] CATH LAB;  Service: Cardiovascular;  Laterality: N/A;   AMPUTATION Right 04/01/2018   Procedure: AMPUTATION BELOW KNEE;  Surgeon: Algernon Huxley, MD;  Location: [facility] ORS;  Service: Vascular;  Laterality: Right;   AMPUTATION TOE Left 08/11/2020   Procedure: AMPUTATION TOE-2nd Toe;  Surgeon: Samara Deist, DPM;  Location: [facility] ORS;  Service: Podiatry;  Laterality: Left;   APPENDECTOMY      CARDIAC CATHETERIZATION  10/14   [place]; X1 STENT PROXIMAL RCA   CARDIAC [facility]/2010   [facility]   CARDIAC CATHETERIZATION  02/21/2014   [facility]   CLAVICLE SURGERY     HERNIA REPAIR     IRRIGATION AND DEBRIDEMENT FOOT Right 01/04/2018   Procedure: IRRIGATION AND DEBRIDEMENT FOOT;  Surgeon: Samara Deist, DPM;  Location: [facility] ORS;  Service: Podiatry;  Laterality: Right;   LEFT HEART CATH AND CORONARY ANGIOGRAPHY N/A 01/20/2020   Procedure: LEFT HEART CATH AND CORONARY ANGIOGRAPHY;  Surgeon: Wellington Hampshire, MD;  Location: [place] CV LAB;  Service: Cardiovascular;  Laterality: N/A;   LEFT HEART CATH AND CORS/GRAFTS ANGIOGRAPHY N/A 04/28/2017   Procedure: LEFT HEART CATH AND CORONARY ANGIOGRAPHY;  Surgeon: Wellington Hampshire, MD;  Location: [place] CV LAB;  Service: Cardiovascular;  Laterality: N/A;   LOWER EXTREMITY ANGIOGRAPHY Right 03/03/2018   Procedure: LOWER EXTREMITY ANGIOGRAPHY;  Surgeon: Katha Cabal, MD;  Location: [place] CV LAB;  Service: Cardiovascular;  Laterality: Right;   LOWER EXTREMITY ANGIOGRAPHY Left 08/24/2018   Procedure: LOWER EXTREMITY ANGIOGRAPHY;  Surgeon: Algernon Huxley, MD;  Location: [place] CV LAB;  Service: Cardiovascular;  Laterality: Left;   LOWER EXTREMITY ANGIOGRAPHY Left 06/14/2019   Procedure: LOWER EXTREMITY ANGIOGRAPHY;  Surgeon: Algernon Huxley, MD;  Location: [place] CV LAB;  Service: Cardiovascular;  Laterality: Left;   LOWER EXTREMITY ANGIOGRAPHY Left 08/02/2019   Procedure: LOWER EXTREMITY ANGIOGRAPHY;  Surgeon: Algernon Huxley, MD;  Location: [place] CV LAB;  Service: Cardiovascular;  Laterality: Left;   LOWER EXTREMITY ANGIOGRAPHY Left 07/17/2020   Procedure: LOWER EXTREMITY ANGIOGRAPHY;  Surgeon: Algernon Huxley, MD;  Location: [place] CV LAB;  Service: Cardiovascular;  Laterality: Left;   PERIPHERAL ARTERIAL STENT GRAFT     x2 left/right   PERIPHERAL VASCULAR BALLOON ANGIOPLASTY Right 01/06/2018   Procedure:  PERIPHERAL VASCULAR BALLOON ANGIOPLASTY;  Surgeon: Katha Cabal, MD;  Location: [place] CV LAB;  Service: Cardiovascular;  Laterality: Right;   PERIPHERAL VASCULAR CATHETERIZATION N/A 07/06/2015   Procedure: Abdominal Aortogram w/Lower Extremity;  Surgeon: Algernon Huxley, MD;  Location: [place] [facility] LAB;  Service: Cardiovascular;  Laterality: N/A;   PERIPHERAL VASCULAR CATHETERIZATION  07/06/2015   Procedure: Lower Extremity Intervention;  Surgeon: Algernon Huxley, MD;  Location: [place] CV LAB;  Service: Cardiovascular;;     Social History   reports that he has been smoking cigarettes. He has a 41.00 pack-year smoking history. He has never used smokeless tobacco. He reports that he does not drink alcohol and does not  use drugs.   Family History   His family history includes Heart attack in his father; Heart disease in his father; Hyperlipidemia in his father and mother; Hypertension in his father and mother.   Allergies Allergies  Allergen Reactions   Dulaglutide Anaphylaxis, Diarrhea and Hives   Ace Inhibitors     hypotension   Other Itching    Skin itching associated with nitro patch   Prednisone Rash     Home Medications  Prior to Admission medications   Medication Sig Start Date End Date Taking? Authorizing Provider  albuterol (VENTOLIN HFA) 108 (90 Base) MCG/ACT inhaler Inhale into the lungs every 6 (six) hours as needed for wheezing or shortness of breath.   Yes [provider]  apixaban (ELIQUIS) 5 MG TABS tablet Take 5 mg by mouth 2 (two) times daily.   Yes [provider]  aspirin 81 MG EC tablet Take 81 mg by mouth daily.   Yes [provider]  atorvastatin (LIPITOR) 80 MG tablet TAKE 1 TABLET BY MOUTH ONCE DAILY 06/12/21  Yes Gollan, Kathlene November, MD  carvedilol (COREG) 3.125 MG tablet TAKE 1 TABLET BY MOUTH TWICE DAILY WITH MEALS 06/12/21  Yes Gollan, Kathlene November, MD  clopidogrel (PLAVIX) 75 MG tablet TAKE 1 TABLET BY MOUTH ONCE DAILY 06/12/21   Yes Gollan, Kathlene November, MD  digoxin (LANOXIN) 0.125 MG tablet TAKE 1 TABLET BY MOUTH ONCE DAILY 06/12/21  Yes Gollan, Kathlene November, MD  DULoxetine (CYMBALTA) 60 MG capsule Take 60 mg by mouth daily.   Yes [provider]  ezetimibe (ZETIA) 10 MG tablet Take 1 tablet (10 mg total) by mouth daily. 10/09/20  Yes Minna Merritts, MD  Fluticasone-Umeclidin-Vilant 100-62.5-25 MCG/INH AEPB Inhale 1 puff into the lungs daily.   Yes [provider]  gabapentin (NEURONTIN) 800 MG tablet Take 800 mg by mouth 4 (four) times daily.  08/14/17 04/04/29 Yes [provider]  ipratropium-albuterol (DUONEB) 0.5-2.5 (3) MG/3ML SOLN Inhale 3 mLs into the lungs every 6 (six) hours as needed (wheezing/sob).    Yes [provider]  isosorbide mononitrate (IMDUR) 30 MG 24 hr tablet Take 1 tablet (30 mg total) by mouth daily. 10/09/20   Minna Merritts, MD  losartan (COZAAR) 25 MG tablet Take 0.5 tablets (12.5 mg total) by mouth daily. 10/09/20   Minna Merritts, MD  metFORMIN (GLUCOPHAGE-XR) 500 MG 24 hr tablet Take 1,000 mg by mouth 2 (two) times daily. 01/02/21   [provider]  metolazone (ZAROXOLYN) 2.5 MG tablet Take 1 tablet (2.5 mg total) by mouth daily as needed. Take 1/2 hour before morning torsemide 04/02/21 07/01/21  Alisa Graff, FNP  Multiple Vitamin (MULTI-VITAMINS) TABS Take 1 tablet by mouth daily.     [provider]  nitroGLYCERIN (NITROSTAT) 0.4 MG SL tablet Place 1 tablet (0.4 mg total) under the tongue every 5 (five) minutes as needed for chest pain. 10/09/20   Minna Merritts, MD  omeprazole (PRILOSEC) 40 MG capsule Take 40 mg by mouth daily.    [provider]  Oxycodone HCl 10 MG TABS Take 10 mg by mouth every 4 (four) hours as needed (pain).    [provider]  polyethylene glycol (MIRALAX / GLYCOLAX) 17 g packet Take 17 g by mouth daily as needed for mild constipation. 08/12/20   Loletha Grayer, MD  potassium chloride (KLOR-CON) 10  MEQ tablet TAKE 1 TABLET BY MOUTH TWICE DAILY 06/12/21   Minna Merritts, MD  promethazine [facility])  25 MG tablet Take 25 mg by mouth every 8 (eight) hours as needed. 04/02/21   [provider]  spironolactone (ALDACTONE) 25 MG tablet Take 0.5 tablets (12.5 mg total) by mouth daily. 10/09/20   Minna Merritts, MD  tamsulosin (FLOMAX) 0.4 MG CAPS capsule Take 1 capsule (0.4 mg total) by mouth daily after supper. 07/01/20   Shawna Clamp, MD  torsemide 40 MG TABS Take 40 mg by mouth 2 (two) times daily. Take 40 mg by mouth twice daily. 03/30/21 04/29/21  Sidney Ace, MD  traZODone (DESYREL) 150 MG tablet Take 150 mg by mouth at bedtime. 07/12/19   [provider]  VERQUVO 10 MG TABS TAKE 1 TABLET BY MOUTH ONCE DAILY 11/16/20   Darylene Price A, FNP   Scheduled Meds:  furosemide  20 mg Intravenous Q12H   Continuous Infusions:  heparin 1,350 Units/hr (06/15/21 0436)   magnesium sulfate bolus IVPB 4 g (06/15/21 0445)   norepinephrine (LEVOPHED) Adult infusion 4 mcg/min (06/14/21 2118)   PRN Meds:.docusate sodium, polyethylene glycol   Assessment & Plan:  Cardiogenic Shock due to Acute on chronic combined syst/diast CHF/ICM EF 20-25% by recent Echo.( Doubt infectious process. Labs and vs not indicative of any active infection BNP 2270, however patient does not appear fluid volume overloaded. -Continuous cardiac monitoring -Maintain MAP greater than 65 -IV Lasix as blood pressure and renal function permits; currently on Lasix 20 mg IV BID, would resume home Torsemide -Cautious IV fluids -Hold coreg,  -Vasopressors as needed to maintain MAP goal -Add Midodrine -Cardiology Consult  NSTEMI Hx: CAD,Ischemic Cardiomyopathy, HLD , HTN Elevated Troponin likely Demand ischemia?HsTrop remains chest pain free -Trend Troponin -Start Heparin gtt  -Cont Asa, Plavix, Atorvastatin, zetia, nitrate.   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 -Pressors for MAP goal >65 -Strict I/O's -Consult to vascular  Permanent Atrial Fibrillation on [place] w/ eliquis  -Hold Eliquis while on Heparin -rate controlled on digoxin.  Check Dig levels -Hold ? blocker in the setting of hypotension   COPD without evidence of acute exacerbation -Supplemental O2 as needed to maintain O2 saturations 88 to 92% -Follow intermittent ABG and chest x-ray as needed -Continue home fluticasone-umeclidin-vilant inhaler -Scheduled Duo Nebs and PRN bronchodilators -Encourage smoking cessation    AKI on CKD Stage III Hyponatremia- Likely in the setting of CHF expect to improve with treatment of the underlying heart failure -Monitor I&O's / urinary output -Follow BMP -Ensure adequate renal perfusion -Avoid nephrotoxic agents as able -Replace electrolytes as indicated   Diabetes mellitus Diabetes Neuropathy -CBGs -Sliding scale insulin -Follow ICU hyper/hypoglycemia protocol -Hold home Metformin     Best practice:  Diet:  Oral Pain/Anxiety/Delirium protocol (if indicated): No VAP protocol (if indicated): Not indicated DVT prophylaxis: Systemic AC GI prophylaxis: N/A Glucose control:  SSI Yes Central venous access:  N/A Arterial line:  N/A Foley:  N/A Mobility:  bed rest  PT consulted: N/A Last date of multidisciplinary goals of care discussion [1/5] Code Status:  full code Disposition: ICU   = Goals of Care = Code Status Order:   Primary Emergency Contact: [place], Home Phone: [phone] Wishes to pursue full aggressive treatment and intervention options, including CPR and intubation, but goals of  care will be addressed on going with family if that should  become necessary.  Critical care time: 45 minutes     Rufina Falco, DNP, CCRN, FNP-C, AGACNP-BC Acute Care Nurse Practitioner  [facility] Pager: [phone] [place] at [facility]  .

## 2021-06-14 NOTE — ED Provider Triage Note (Signed)
Emergency Medicine Provider Triage Evaluation Note  [name] [name] , a 61 y.o. male  was evaluated in triage.  Pt complains of severe wound infection on the left foot and leg.  Redness is started going up towards the knee.  Patient has a history BKA on the right leg. .  Review of Systems  Positive: Wound infection, redness and swelling of the left leg Negative: Fever, chest pain shortness of breath  Physical Exam  BP (!) 65/51    Pulse 80    Temp 98.4 F (36.9 C) (Oral)    Resp 18    Ht 5\' 7"  (1.702 m)    Wt 84.4 kg    SpO2 97%    BMI 29.13 kg/m  Gen:   Awake, no distress   Resp:  Normal effort, some wheezing noted MSK:   Moves extremities without difficulty, large amount of redness and swelling noted along the left lower extremity, open wounds noted Other:    Medical Decision Making  Medically screening exam initiated at 4:00 PM.  Appropriate orders placed.  [name] was informed that the remainder of the evaluation will be completed by another provider, this initial triage assessment does not replace that evaluation, and the importance of remaining in the ED until their evaluation is complete.  Sepsis work-up initiated   Versie Starks, PA-C 06/14/21 1601

## 2021-06-14 NOTE — ED Notes (Signed)
ED MD notified of pt's BP of 69/48.

## 2021-06-14 NOTE — ED Notes (Signed)
Dr. Tamala Julian at bedside for pt's BP. Norepi initiated.

## 2021-06-14 NOTE — ED Notes (Signed)
MD notified pt's O2 sat dropped to 86% when sleeping, so pt. Placed on 2L [name] [name].

## 2021-06-14 NOTE — Consult Note (Signed)
PHARMACY CONSULT NOTE - FOLLOW UP  Pharmacy Consult for Electrolyte Monitoring and Replacement   Recent Labs: Potassium (mmol/L)  Date Value  06/14/2021 4.5  06/08/2014 4.3   Magnesium (mg/dL)  Date Value  03/30/2021 2.0  02/06/2014 1.4 (L)   Calcium (mg/dL)  Date Value  06/14/2021 8.9   Calcium, Total (mg/dL)  Date Value  06/08/2014 9.2   Albumin (g/dL)  Date Value  06/14/2021 3.7  01/29/2018 3.3 (L)  03/07/2014 3.9   Phosphorus (mg/dL)  Date Value  03/29/2021 3.6   Sodium (mmol/L)  Date Value  06/14/2021 131 (L)  04/17/2021 129 (L)  06/08/2014 136     Assessment: 61 y/o male presented 06/14/21 with complaints of diabetic wound infection and acute on chronic HF exacerbation. Pt with PMH of afib. Pharmacy to monitor and replace electrolytes as needed  Medications: --Lasix 20 mg IV Q12H  Goal of Therapy:  K: 4-5.1 Mag: >2 All other electrolytes WNL  Plan:  Hyponatremia/chloremia: correction per provider, unsure of volume status given elevated BNP Could consider NaCl PO tabs Recheck all electrolytes with AM labs  Dorothe Pea ,PharmD, BCPS Clinical Pharmacist 06/15/2021 12:37 AM

## 2021-06-14 NOTE — ED Triage Notes (Addendum)
Pt to ER via POV with complaints of diabetic wound infection. Pt with multiple wounds present to left ankle, foot, and shin, various stages of healing some weeping and opening others scabbed over. Large black ulcer present to heel. Pt reports no sensation present to left foot. Pt also reports feeling weak and tired.  Right sided BKA. Difficulty ambulating. Reports blood sugars have been high.

## 2021-06-14 NOTE — ED Notes (Signed)
Lab called critical lab result Trop:341 Dr. Tamala Julian notified 2020

## 2021-06-14 NOTE — ED Provider Notes (Signed)
[facility] Provider Note    Event Date/Time   First MD Initiated Contact with Patient 06/14/21 1854     (approximate)   History   Wound Infection   HPI  [name] [name] is a 61 y.o. male who presents to the ED for evaluation of possible wound Infection and exertional CP/SOB   I review outpatient cardiology visit from 12/13.EF of 20%.  History of PAD, HTN, HLD, DM, COPD.  S/p right BKA remotely.  Patient presents to the ED for evaluation of possible infection to acute on chronic ulcers to his left leg.  He reports some purulent discharge from an ulcer to his posterior heel on the left and increasing erythema to his left lower leg.  He denies any systemic symptoms such as fevers, nausea, dizziness, syncope or falls.  Initially denies most cardiopulmonary symptoms, but with little pressing he does report having exertional dyspnea and exertional chest pain that does improve with rest.  Denies any chest pain right now.  Physical Exam   Triage Vital Signs: ED Triage Vitals  Enc Vitals Group     BP 06/14/21 1556 (!) 65/51     Pulse Rate 06/14/21 1556 80     Resp 06/14/21 1556 18     Temp 06/14/21 1553 98.4 F (36.9 C)     Temp Source 06/14/21 1553 Oral     SpO2 06/14/21 1556 97 %     Weight 06/14/21 1553 186 lb (84.4 kg)     Height 06/14/21 1553 5\' 7"  (1.702 m)     Head Circumference --      Peak Flow --      Pain Score 06/14/21 1553 0     Pain Loc --      Pain Edu? --      Excl. in [place]? --     Most recent vital signs: Vitals:   06/14/21 2120 06/14/21 2125  BP: (!) 66/55 (!) 74/52  Pulse: (!) 53 62  Resp: 14 13  Temp:    SpO2: 100% 100%    General: Awake, no distress.  Sitting up in bed and conversational with soft blood pressures. CV:  Good peripheral perfusion.  Resp:  Mild tachypnea to the low 20s.  Diffuse expiratory wheezes and decreased air movement throughout.  Some faint bibasilar crackles are present. Abd:  No distention.   MSK:  S/p right BKA with clean stump and prosthetic in place.   Left leg with multiple superficial ulcerative lesions of various stages of healing.  Mild pitting edema.  Erythema up to the mid shin, as pictured below.  No induration or fluctuance.  No gross purulence. Neuro:  No focal deficits appreciated. Other:         ED Results / Procedures / Treatments   Labs (all labs ordered are listed, but only abnormal results are displayed) Labs Reviewed  LACTIC ACID, PLASMA - Abnormal; Notable for the following components:      Result Value   Lactic Acid, Venous <0.3 (*)    All other components within normal limits  COMPREHENSIVE METABOLIC PANEL - Abnormal; Notable for the following components:   Sodium 131 (*)    Chloride 95 (*)    Glucose, Bld 107 (*)    BUN 28 (*)    Creatinine, Ser 1.33 (*)    Total Bilirubin 2.1 (*)    All other components within normal limits  CBC WITH DIFFERENTIAL/PLATELET - Abnormal; Notable for the following components:   RBC 3.79 (*)  Hemoglobin 10.3 (*)    HCT 31.4 (*)    RDW 22.1 (*)    All other components within normal limits  BRAIN NATRIURETIC PEPTIDE - Abnormal; Notable for the following components:   B Natriuretic Peptide 2,270.8 (*)    All other components within normal limits  TROPONIN I (HIGH SENSITIVITY) - Abnormal; Notable for the following components:   Troponin I (High Sensitivity) 341 (*)    All other components within normal limits  TROPONIN I (HIGH SENSITIVITY) - Abnormal; Notable for the following components:   Troponin I (High Sensitivity) 352 (*)    All other components within normal limits  RESP PANEL BY RT-PCR (FLU A&B, COVID) ARPGX2  CULTURE, BLOOD (SINGLE)  LACTIC ACID, PLASMA  PROCALCITONIN  PROTIME-INR  APTT  URINALYSIS, ROUTINE W REFLEX MICROSCOPIC  PROCALCITONIN  HEPARIN LEVEL (UNFRACTIONATED)  CBC  HEPARIN LEVEL (UNFRACTIONATED)  APTT  BASIC METABOLIC PANEL  PHOSPHORUS  MAGNESIUM  APTT  PROTIME-INR  TSH     EKG  A. fib with a rate of 84 bpm.,  Normal axis, right bundle and nonspecific ST changes inferiorly and laterally without STEMI criteria, considering his bundle branch block.  Appears similar to comparison from October 19th  RADIOLOGY CXR reviewed by me without evidence of acute cardiopulmonary pathology. Chronic hyperinflation  Official radiology report(s): DG Chest 2 View  Result Date: 06/14/2021 CLINICAL DATA:  COPD exacerbation. EXAM: CHEST - 2 VIEW COMPARISON:  05/27/2021 FINDINGS: Chronic cardiomegaly, unchanged. Unchanged mediastinal contours with aortic atherosclerosis. Similar hyperinflation and peribronchial thickening. No focal airspace disease, pleural effusion, or pneumothorax. Minor thoracic spondylosis. Chronic widening of the right acromioclavicular joint. IMPRESSION: 1. No acute abnormality. 2. Stable cardiomegaly. 3. Chronic hyperinflation and peribronchial thickening consistent with provided history of COPD. Electronically Signed   By: Keith Rake M.D.   On: 06/14/2021 19:29   DG Tibia/Fibula Left  Result Date: 06/14/2021 CLINICAL DATA:  Diabetic wound infection. EXAM: LEFT TIBIA AND FIBULA - 2 VIEW COMPARISON:  None. FINDINGS: There is no evidence of fracture or other focal bone lesions. Vascular calcifications are noted. IMPRESSION: Normal left tibia and fibula. Electronically Signed   By: Marijo Conception M.D.   On: 06/14/2021 16:49   DG Foot Complete Left  Result Date: 06/14/2021 CLINICAL DATA:  Left foot wound. EXAM: LEFT FOOT - COMPLETE 3+ VIEW COMPARISON:  None. FINDINGS: Status post amputation of second toe. No fracture or dislocation is noted. Joint spaces are intact. No lytic destruction is seen to suggest osteomyelitis. Soft tissues are unremarkable. IMPRESSION: No acute abnormality seen. Electronically Signed   By: Marijo Conception M.D.   On: 06/14/2021 16:51    PROCEDURES and INTERVENTIONS:  .1-3 Lead EKG Interpretation Performed by: Vladimir Crofts,  MD Authorized by: Vladimir Crofts, MD     Interpretation: normal     ECG rate:  70   ECG rate assessment: normal     Rhythm: atrial fibrillation     Ectopy: none     Conduction: normal   .Critical Care Performed by: Vladimir Crofts, MD Authorized by: Vladimir Crofts, MD   Critical care provider statement:    Critical care time (minutes):  30   Critical care time was exclusive of:  Separately billable procedures and treating other patients   Critical care was necessary to treat or prevent imminent or life-threatening deterioration of the following conditions:  Cardiac failure, circulatory failure and respiratory failure   Critical care was time spent personally by me on the following activities:  Development  of treatment plan with patient or surrogate, discussions with consultants, evaluation of patient's response to treatment, examination of patient, ordering and review of laboratory studies, ordering and review of radiographic studies, ordering and performing treatments and interventions, pulse oximetry, re-evaluation of patient's condition and review of old charts  Medications  norepinephrine (LEVOPHED) 4mg  in 285mL (0.016 mg/mL) premix infusion (4 mcg/min Intravenous New Bag/Given 06/14/21 2118)  heparin ADULT infusion 100 units/mL (25000 units/211mL) (1,050 Units/hr Intravenous New Bag/Given 06/14/21 2142)  docusate sodium (COLACE) capsule 100 mg (has no administration in time range)  polyethylene glycol (MIRALAX / GLYCOLAX) packet 17 g (has no administration in time range)  furosemide (LASIX) injection 20 mg (has no administration in time range)  vancomycin (VANCOREADY) IVPB 2000 mg/400 mL (0 mg Intravenous Stopped 06/14/21 1852)  sodium chloride 0.9 % bolus 1,000 mL (0 mLs Intravenous Stopped 06/14/21 1852)  ipratropium-albuterol (DUONEB) 0.5-2.5 (3) MG/3ML nebulizer solution 6 mL (6 mLs Nebulization Given 06/14/21 1950)  methylPREDNISolone sodium succinate (SOLU-MEDROL) 125 mg/2 mL injection 125 mg  (125 mg Intravenous Given 06/14/21 1951)  cefTRIAXone (ROCEPHIN) 1 g in sodium chloride 0.9 % 100 mL IVPB (0 g Intravenous Stopped 06/14/21 2111)  heparin injection 4,000 Units (4,000 Units Intravenous Given 06/14/21 2103)     IMPRESSION / MDM / ASSESSMENT AND PLAN / ED COURSE  I reviewed the triage vital signs and the nursing notes.  61 year old vasculopath presents to the ED with concerns for wound infection, possibly cellulitis to his leg, but more concerningly has evidence of cardiogenic shock and an NSTEMI.  He is initially normoxic on room air, but does desaturate while sleeping.  He has hypertensive blood pressures that do not resolve with 1 L of IV fluids, and so started on peripheral vasopressors considering his poor ejection fraction and concern for cardiogenic shock and already signs of fluid overload.  He has no active chest pain and his EKG shows chronic nonspecific ST changes without STEMI criteria.  Troponin is elevated and he is started on heparin to treat an NSTEMI.  BNP is further elevated suggestive of CHF exacerbation.  Signs of COPD exacerbation on examination and and this is treated with steroids and breathing treatments.  While he has erythema to his leg, some of this may be due to chronic venous stasis.  Cellulitis is a possibility and this is covered with vancomycin and Rocephin.  Procalcitonin is negative and no signs of osteo on plain films.  We will consult with the ICU for admission considering his multiple pathologies and on Levophed.  Clinical Course as of 06/14/21 2249  Thu Jun 14, 2021  1906 Discussed plan of care with patient and his girlfriend.  We discussed COPD exacerbation, possible wound infection and likely needing medical admission.  He is agreeable. [DS]  2033 Reassessed. Feeling better after breathing treatment [DS]    Clinical Course User Index [DS] Vladimir Crofts, MD     FINAL CLINICAL IMPRESSION(S) / ED DIAGNOSES   Final diagnoses:  Cardiogenic shock  ([place])  Acute on chronic systolic congestive heart failure (HCC)  NSTEMI (non-ST elevated myocardial infarction) ([place])     Rx / DC Orders   ED Discharge Orders     None        Note:  This document was prepared using Dragon voice recognition software and may include unintentional dictation errors.   Vladimir Crofts, MD 06/14/21 2300

## 2021-06-14 NOTE — ED Notes (Signed)
Sharyn Lull, RN verified both heparin and norepi dosing and rate

## 2021-06-14 NOTE — ED Notes (Signed)
Dr. Lanney Gins notified pt's trop went from 341 to 352.

## 2021-06-14 NOTE — ED Triage Notes (Signed)
Pt c/o increased pain redness and swelling of the LLE for the past 2 weeks, pt has hx of vascular issues with a hx of amputation of the RLE from same issue. Pt is a/ox4

## 2021-06-14 NOTE — Consult Note (Signed)
ANTICOAGULATION CONSULT NOTE   Pharmacy Consult for Heparin Indication: chest pain/ACS  Allergies  Allergen Reactions   Dulaglutide Anaphylaxis, Diarrhea and Hives   Ace Inhibitors     hypotension   Other Itching    Skin itching associated with nitro patch   Prednisone Rash    Patient Measurements: Height: 5\' 7"  (170.2 cm) Weight: 84.4 kg (186 lb) IBW/kg (Calculated) : 66.1 Heparin Dosing Weight: 83.1 kg  Vital Signs: Temp: 98.4 F (36.9 C) (01/05 1553) Temp Source: Oral (01/05 1553) BP: 82/73 (01/05 2030) Pulse Rate: 85 (01/05 2030)  Labs: Recent Labs    06/14/21 1557 06/14/21 1908 06/14/21 2015  HGB 10.3*  --   --   HCT 31.4*  --   --   PLT 173  --   --   APTT  --   --  34  LABPROT  --   --  14.9  INR  --   --  1.2  CREATININE 1.33*  --   --   TROPONINIHS  --  341*  --     Estimated Creatinine Clearance: 62.9 mL/min (A) (by C-G formula based on SCr of 1.33 mg/dL (H)).   Medical History: Past Medical History:  Diagnosis Date   Acute on chronic heart failure ([place])    AKI (acute kidney injury) ([place]) 01/03/2018   Angina at rest [facility]) 08/13/2017   Arthritis    Asthma    Atherosclerosis    BPH (benign prostatic hyperplasia)    Carotid arterial disease ([place])    Cellulitis of foot, left 09/27/2019   Charcot's joint of foot, right    Chronic combined systolic (congestive) and diastolic (congestive) heart failure ([place])    a. 02/2013 EF 50% by LV gram; b. 04/2017 Echo: EF 25-30%. diff HK. Gr2 DD; c. 06/2020 Echo: EF <20%, glob HK. RVS 53.74mmHg. Sev BAE. Mild to mod TR.   Chronic foot pain (Secondary Area of Pain) (Right) 01/29/2018   COPD (chronic obstructive pulmonary disease) (HCC)    COPD with acute exacerbation ([place]) 01/30/2020   Coronary artery disease    a. 2013 S/P PCI of LAD [facility]);  b. 03/2013 PCI: RCA 90p ( 2.5 x 23 mm DES); c. 02/2014 Cath: patent RCA stent->Med Rx; d. 04/2017 Cath: LM nl, LAd 30ost/p, 82m/d, D1 80ost, LCX 90p/m, OM1 85, RCA 70/20p,  29m, 70d->Referred for CT Surg-felt to be poor candidate; d. 01/2020 Cath: LM nl, LAD 20p/m, 60d. D1 80. LCX 90p/m. LPAV 95, RCA 80/20p, 80p/m.   Diabetic neuropathy ([place])    Diabetic ulcer of right foot ([place])    Hernia    HFrEF (heart failure with reduced ejection fraction) ([place]) 01/30/2020   Hyperlipidemia    Hypertension    Ischemic cardiomyopathy    a. 02/2013 EF 50% by LV gram; b. 04/2017 Echo: EF 25-30%. diff HK. Gr2 DD; c. 06/2020 Echo: EF <20%, glob HK. RVS 53.88mmHg. Sev BAE. Mild to mod TR.   Morbid obesity ([place])    Neuropathy    PAD (peripheral artery disease) ([place])    a. Followed by Dr. Lucky Cowboy; b. 08/2010 Periph Angio: RSFA 70-80p (6X60 self-expanding stent); c. 09/2010 Periph Angio: L SFA 100p (7x unknown length self-expanding stent); d. 06/2015 Periph Angio: R SFA short segment occlusion (6x12 self-expanding stent).   Persistent atrial fibrillation (HCC)    Restless leg syndrome    Sepsis ([place]) 03/30/2018   Sleep apnea    Subclavian artery stenosis, right (HCC)    Syncope and collapse  Tobacco use    a. 75+ yr hx - still smoking 1ppd, down from 2 ppd.   Type II diabetes mellitus ([place])    Unstable angina ([place]) 07/11/2016   Varicose vein     Medications:  (Not in a hospital admission)  Scheduled:  Infusions:   cefTRIAXone (ROCEPHIN)  IV 1 g (06/14/21 2041)   norepinephrine (LEVOPHED) Adult infusion 2 mcg/min (06/14/21 2102)   PRN:  Anti-infectives (From admission, onward)    Start     Dose/Rate Route Frequency Ordered Stop   06/14/21 2045  cefTRIAXone (ROCEPHIN) 1 g in sodium chloride 0.9 % 100 mL IVPB        1 g 200 mL/hr over 30 Minutes Intravenous  Once 06/14/21 2035     06/14/21 1615  vancomycin (VANCOREADY) IVPB 2000 mg/400 mL        2,000 mg 200 mL/hr over 120 Minutes Intravenous  Once 06/14/21 1607 06/14/21 1852       Assessment: Pharmacy consulted for heparin for ACS. Pt is on apixaban for afib. Will use aPTT for monitoring and order heparin level for  baseline.    Goal of Therapy:  Heparin level 0.3-0.7 units/ml aPTT 65-102 seconds.  Monitor platelets by anticoagulation protocol: Yes   Plan:  Give 4000 units bolus x 1 Start heparin infusion at 1050 units/hr Check anti-Xa and aPTT level in 6 hours and daily while on heparin Continue to monitor H&H and platelets  Oswald Hillock, PharmD, BCPS 06/14/2021,9:09 PM

## 2021-06-15 ENCOUNTER — Inpatient Hospital Stay (HOSPITAL_COMMUNITY)
Admit: 2021-06-15 | Discharge: 2021-06-15 | Disposition: A | Discharge status: Home / Self Care | Payer: Medicare Other | Attending: Nurse Practitioner | Admitting: Nurse Practitioner

## 2021-06-15 DIAGNOSIS — I5041 Acute combined systolic (congestive) and diastolic (congestive) heart failure: Secondary | ICD-10-CM

## 2021-06-15 DIAGNOSIS — R778 Other specified abnormalities of plasma proteins: Secondary | ICD-10-CM

## 2021-06-15 DIAGNOSIS — I5023 Acute on chronic systolic (congestive) heart failure: Secondary | ICD-10-CM

## 2021-06-15 DIAGNOSIS — I4821 Permanent atrial fibrillation: Secondary | ICD-10-CM

## 2021-06-15 DIAGNOSIS — I2510 Atherosclerotic heart disease of native coronary artery without angina pectoris: Secondary | ICD-10-CM

## 2021-06-15 LAB — HEPARIN LEVEL (UNFRACTIONATED)
Heparin Unfractionated: 0.12 IU/mL — ABNORMAL LOW (ref 0.30–0.70)
Heparin Unfractionated: 0.19 IU/mL — ABNORMAL LOW (ref 0.30–0.70)
Heparin Unfractionated: 0.3 IU/mL (ref 0.30–0.70)

## 2021-06-15 LAB — CBC
HCT: 31.2 % — ABNORMAL LOW (ref 39.0–52.0)
Hemoglobin: 10.2 g/dL — ABNORMAL LOW (ref 13.0–17.0)
MCH: 26.9 pg (ref 26.0–34.0)
MCHC: 32.7 g/dL (ref 30.0–36.0)
MCV: 82.3 fL (ref 80.0–100.0)
Platelets: 183 10*3/uL (ref 150–400)
RBC: 3.79 MIL/uL — ABNORMAL LOW (ref 4.22–5.81)
RDW: 22 % — ABNORMAL HIGH (ref 11.5–15.5)
WBC: 4.3 10*3/uL (ref 4.0–10.5)
nRBC: 0 % (ref 0.0–0.2)

## 2021-06-15 LAB — ECHOCARDIOGRAM COMPLETE
AR max vel: 2.22 cm2
AV Area VTI: 1.97 cm2
AV Area mean vel: 2.2 cm2
AV Mean grad: 7 mmHg
AV Peak grad: 12.5 mmHg
Ao pk vel: 1.77 m/s
Area-P 1/2: 4.97 cm2
Height: 67 in
MV VTI: 2.39 cm2
S' Lateral: 6.09 cm
Weight: 2976 oz

## 2021-06-15 LAB — RESPIRATORY PANEL BY PCR

## 2021-06-15 LAB — BASIC METABOLIC PANEL
Anion gap: 9 (ref 5–15)
BUN: 29 mg/dL — ABNORMAL HIGH (ref 6–20)
CO2: 25 mmol/L (ref 22–32)
Calcium: 8.5 mg/dL — ABNORMAL LOW (ref 8.9–10.3)
Chloride: 98 mmol/L (ref 98–111)
Creatinine, Ser: 1.24 mg/dL (ref 0.61–1.24)
GFR, Estimated: >60 mL/min (ref >60)
Glucose, Bld: 248 mg/dL — ABNORMAL HIGH (ref 70–99)
Potassium: 4.8 mmol/L (ref 3.5–5.1)
Sodium: 132 mmol/L — ABNORMAL LOW (ref 135–145)

## 2021-06-15 LAB — APTT: aPTT: 53 seconds — ABNORMAL HIGH (ref 24–36)

## 2021-06-15 LAB — PHOSPHORUS: Phosphorus: 4.4 mg/dL (ref 2.5–4.6)

## 2021-06-15 LAB — PROCALCITONIN: Procalcitonin: <0.1 ng/mL

## 2021-06-15 LAB — TROPONIN I (HIGH SENSITIVITY): Troponin I (High Sensitivity): 182 ng/L (ref <18)

## 2021-06-15 LAB — MAGNESIUM: Magnesium: 1.4 mg/dL — ABNORMAL LOW (ref 1.7–2.4)

## 2021-06-15 MED ORDER — HEPARIN BOLUS VIA INFUSION
2500.0000 [IU] | Freq: Once | INTRAVENOUS | Status: AC
  Administered 2021-06-15: 2500 [IU] via INTRAVENOUS
  Filled 2021-06-15: qty 2500

## 2021-06-15 MED ORDER — MORPHINE SULFATE (PF) 2 MG/ML IV SOLN
2.0000 mg | INTRAVENOUS | Status: DC | PRN
  Administered 2021-06-15 – 2021-06-16 (×3): 2 mg via INTRAVENOUS
  Filled 2021-06-15 (×3): qty 1

## 2021-06-15 MED ORDER — MAGNESIUM SULFATE 4 GM/100ML IV SOLN
4.0000 g | Freq: Once | INTRAVENOUS | Status: AC
  Administered 2021-06-15: 4 g via INTRAVENOUS
  Filled 2021-06-15: qty 100

## 2021-06-15 MED ORDER — PERFLUTREN LIPID MICROSPHERE
1.0000 mL | INTRAVENOUS | Status: AC | PRN
  Administered 2021-06-15: 2 mL via INTRAVENOUS
  Filled 2021-06-15: qty 10

## 2021-06-15 NOTE — Consult Note (Signed)
Cardiology Consultation:   Patient ID: HASON OFARRELL; [id number]; Jun 25, 1960   Admit date: 06/14/2021 Date of Consult: 06/15/2021  Primary Care Provider: [name], MD Primary Cardiologist: Rockey Situ Primary Electrophysiologist:  None   Patient Profile:   [name] [name] is a 61 y.o. male with a hx of CAD s/p prior PCI, chronic combined systolic and diastolic congestive heart failure, ICM, pulmonary hypertension, tobacco abuse, COPD, HTN, HLD, permanent atrial fibrillation, obesity, obstructive sleep apnea, type 2 diabetes mellitus, stage III chronic kidney disease, peripheral arterial disease status post right BKA, and carotid arterial disease, who is being seen today for the evaluation of SOB and chest tightness at the request of Dr. Lanney Gins.  History of Present Illness:   Mr. Labelle previously underwent PCI of the LAD in 2013 with PCI of the RCA in October 2014.  Catheterization in November 2018 revealed severe multivessel coronary artery disease.  He was seen by CT surgery and not felt to be a suitable candidate.  He has a history of LV dysfunction with an EF of less than 20% by echo in January of this year.  He had prolonged hospitalization at that time for significant volume overload and low output heart failure.  He was admitted twice in 03/2021, initially with lower extremity cellulitis and COPD exacerbation. He was admitted a 2nd time in 03/2021 with shock possibly cardiogenic vs septic in etiology and underwent significant diuresis, though ultimately left AMA.  Echo at that time showed an EF of 20-25% and severely reduced RVSF with severely elevated PASP as outlined below.   He was admitted on 06/14/2021 with increased pain and wounds along the left lower extremity and a mechanical fall. Upon arrival to the ED, he also reported some exertional dyspnea and chest discomfort. Initial BP 65/51, HR 80 bpm, oxygen saturation 97% opn room air, afebrile. EKG showed Afib, 84 bpm, RBBB, PVCs,  and was not significantly changed when compared to prior tracing. CXR showed no acute abnormality with stable cardiomegaly and chronic hyperinflation. BNP 2270. Troponin 341. PCT < 0.10. Covid and flu negative. He has received IV fluid bolus, IV Lasix, IV Rocephin, IV steroids, Duoneb, and was placed on Levophed and heparin gtts. BP currently stable in the 1-teens systolic. Documented net + 250 mL for the admission to date. He denies any chest pain and reports his breathing is improved. Denies missing medications. Reports the primary reason for his presentation was lower extremity swelling, warmth, erythema, and nonhealing wounds.     Past Medical History:  Diagnosis Date   Acute on chronic heart failure ([place])    AKI (acute kidney injury) ([place]) 01/03/2018   Angina at rest [facility]) 08/13/2017   Arthritis    Asthma    Atherosclerosis    BPH (benign prostatic hyperplasia)    Carotid arterial disease ([place])    Cellulitis of foot, left 09/27/2019   Charcot's joint of foot, right    Chronic combined systolic (congestive) and diastolic (congestive) heart failure ([place])    a. 02/2013 EF 50% by LV gram; b. 04/2017 Echo: EF 25-30%. diff HK. Gr2 DD; c. 06/2020 Echo: EF <20%, glob HK. RVS 53.60mmHg. Sev BAE. Mild to mod TR.   Chronic foot pain (Secondary Area of Pain) (Right) 01/29/2018   COPD (chronic obstructive pulmonary disease) (HCC)    COPD with acute exacerbation ([place]) 01/30/2020   Coronary artery disease    a. 2013 S/P PCI of LAD [facility]);  b. 03/2013 PCI: RCA 90p ( 2.5  x 23 mm DES); c. 02/2014 Cath: patent RCA stent->Med Rx; d. 04/2017 Cath: LM nl, LAd 30ost/p, 102m/d, D1 80ost, LCX 90p/m, OM1 85, RCA 70/20p, 43m, 70d->Referred for CT Surg-felt to be poor candidate; d. 01/2020 Cath: LM nl, LAD 20p/m, 60d. D1 80. LCX 90p/m. LPAV 95, RCA 80/20p, 80p/m.   Diabetic neuropathy ([place])    Diabetic ulcer of right foot ([place])    Hernia    HFrEF (heart failure with reduced ejection fraction) ([place]) 01/30/2020    Hyperlipidemia    Hypertension    Ischemic cardiomyopathy    a. 02/2013 EF 50% by LV gram; b. 04/2017 Echo: EF 25-30%. diff HK. Gr2 DD; c. 06/2020 Echo: EF <20%, glob HK. RVS 53.47mmHg. Sev BAE. Mild to mod TR.   Morbid obesity ([place])    Neuropathy    PAD (peripheral artery disease) ([place])    a. Followed by Dr. Lucky Cowboy; b. 08/2010 Periph Angio: RSFA 70-80p (6X60 self-expanding stent); c. 09/2010 Periph Angio: L SFA 100p (7x unknown length self-expanding stent); d. 06/2015 Periph Angio: R SFA short segment occlusion (6x12 self-expanding stent).   Persistent atrial fibrillation (HCC)    Restless leg syndrome    Sepsis ([place]) 03/30/2018   Sleep apnea    Subclavian artery stenosis, right (HCC)    Syncope and collapse    Tobacco use    a. 75+ yr hx - still smoking 1ppd, down from 2 ppd.   Type II diabetes mellitus (HCC)    Unstable angina ([place]) 07/11/2016   Varicose vein     Past Surgical History:  Procedure Laterality Date   ABDOMINAL AORTAGRAM N/A 06/23/2013   Procedure: ABDOMINAL Maxcine Ham;  Surgeon: Wellington Hampshire, MD;  Location: [place] CATH [facility];  Service: Cardiovascular;  Laterality: N/A;   AMPUTATION Right 04/01/2018   Procedure: AMPUTATION BELOW KNEE;  Surgeon: Algernon Huxley, MD;  Location: [facility] ORS;  Service: Vascular;  Laterality: Right;   AMPUTATION TOE Left 08/11/2020   Procedure: AMPUTATION TOE-2nd Toe;  Surgeon: Samara Deist, DPM;  Location: [facility] ORS;  Service: Podiatry;  Laterality: Left;   APPENDECTOMY     CARDIAC CATHETERIZATION  10/14   [place]; X1 STENT PROXIMAL RCA   CARDIAC CATHETERIZATION  02/2010   [facility]   CARDIAC CATHETERIZATION  02/21/2014   [facility]   CLAVICLE SURGERY     HERNIA REPAIR     IRRIGATION AND DEBRIDEMENT FOOT Right 01/04/2018   Procedure: IRRIGATION AND DEBRIDEMENT FOOT;  Surgeon: Samara Deist, DPM;  Location: [facility] ORS;  Service: Podiatry;  Laterality: Right;   LEFT HEART CATH AND CORONARY ANGIOGRAPHY N/A 01/20/2020   Procedure: LEFT HEART CATH AND CORONARY ANGIOGRAPHY;   Surgeon: Wellington Hampshire, MD;  Location: [place] CV LAB;  Service: Cardiovascular;  Laterality: N/A;   LEFT HEART CATH AND CORS/GRAFTS ANGIOGRAPHY N/A 04/28/2017   Procedure: LEFT HEART CATH AND CORONARY ANGIOGRAPHY;  Surgeon: Wellington Hampshire, MD;  Location: [place] CV LAB;  Service: Cardiovascular;  Laterality: N/A;   LOWER EXTREMITY ANGIOGRAPHY Right 03/03/2018   Procedure: LOWER EXTREMITY ANGIOGRAPHY;  Surgeon: Katha Cabal, MD;  Location: [place] [facility];  Service: Cardiovascular;  Laterality: Right;   LOWER EXTREMITY ANGIOGRAPHY Left 08/24/2018   Procedure: LOWER EXTREMITY ANGIOGRAPHY;  Surgeon: Algernon Huxley, MD;  Location: [place] CV LAB;  Service: Cardiovascular;  Laterality: Left;   LOWER EXTREMITY ANGIOGRAPHY Left 06/14/2019   Procedure: LOWER EXTREMITY ANGIOGRAPHY;  Surgeon: Algernon Huxley, MD;  Location: [place] CV LAB;  Service: Cardiovascular;  Laterality:  Left;   LOWER EXTREMITY ANGIOGRAPHY Left 08/02/2019   Procedure: LOWER EXTREMITY ANGIOGRAPHY;  Surgeon: Algernon Huxley, MD;  Location: [place] CV LAB;  Service: Cardiovascular;  Laterality: Left;   LOWER EXTREMITY ANGIOGRAPHY Left 07/17/2020   Procedure: LOWER EXTREMITY ANGIOGRAPHY;  Surgeon: Algernon Huxley, MD;  Location: [place] [facility] LAB;  Service: Cardiovascular;  Laterality: Left;   PERIPHERAL ARTERIAL STENT GRAFT     x2 left/right   PERIPHERAL VASCULAR BALLOON ANGIOPLASTY Right 01/06/2018   Procedure: PERIPHERAL VASCULAR BALLOON ANGIOPLASTY;  Surgeon: Katha Cabal, MD;  Location: [place] CV LAB;  Service: Cardiovascular;  Laterality: Right;   PERIPHERAL VASCULAR CATHETERIZATION N/A 07/06/2015   Procedure: Abdominal Aortogram w/Lower Extremity;  Surgeon: Algernon Huxley, MD;  Location: [place] CV LAB;  Service: Cardiovascular;  Laterality: N/A;   PERIPHERAL VASCULAR CATHETERIZATION  07/06/2015   Procedure: Lower Extremity Intervention;  Surgeon: Algernon Huxley, MD;  Location: [place] CV LAB;  Service: Cardiovascular;;     Home Meds: Prior to Admission medications   Medication Sig Start Date End Date Taking? Authorizing Provider  albuterol (VENTOLIN HFA) 108 (90 Base) MCG/ACT inhaler Inhale into the lungs every 6 (six) hours as needed for wheezing or shortness of breath.   Yes [provider]  apixaban (ELIQUIS) 5 MG TABS tablet Take 5 mg by mouth 2 (two) times daily.   Yes [provider]  aspirin 81 MG EC tablet Take 81 mg by mouth daily.   Yes [provider]  atorvastatin (LIPITOR) 80 MG tablet TAKE 1 TABLET BY MOUTH ONCE DAILY 06/12/21  Yes Gollan, Kathlene November, MD  carvedilol (COREG) 3.125 MG tablet TAKE 1 TABLET BY MOUTH TWICE DAILY WITH MEALS 06/12/21  Yes Gollan, Kathlene November, MD  clopidogrel (PLAVIX) 75 MG tablet TAKE 1 TABLET BY MOUTH ONCE DAILY 06/12/21  Yes Gollan, Kathlene November, MD  digoxin (LANOXIN) 0.125 MG tablet TAKE 1 TABLET BY MOUTH ONCE DAILY 06/12/21  Yes Gollan, Kathlene November, MD  DULoxetine (CYMBALTA) 60 MG capsule Take 60 mg by mouth daily.   Yes [provider]  ezetimibe (ZETIA) 10 MG tablet Take 1 tablet (10 mg total) by mouth daily. 10/09/20  Yes Minna Merritts, MD  Fluticasone-Umeclidin-Vilant 100-62.5-25 MCG/INH AEPB Inhale 1 puff into the lungs daily.   Yes [provider]  gabapentin (NEURONTIN) 800 MG tablet Take 800 mg by mouth 4 (four) times daily.  08/14/17 04/04/29 Yes [provider]  ipratropium-albuterol (DUONEB) 0.5-2.5 (3) MG/3ML SOLN Inhale 3 mLs into the lungs every 6 (six) hours as needed (wheezing/sob).    Yes [provider]  isosorbide mononitrate (IMDUR) 30 MG 24 hr tablet Take 1 tablet (30 mg total) by mouth daily. 10/09/20  Yes Minna Merritts, MD  losartan (COZAAR) 25 MG tablet Take 0.5 tablets (12.5 mg total) by mouth daily. 10/09/20  Yes Minna Merritts, MD  metFORMIN (GLUCOPHAGE-XR) 500 MG 24 hr tablet Take 1,000 mg by mouth 2 (two) times daily. 01/02/21  Yes [provider]  metolazone (ZAROXOLYN) 2.5 MG tablet Take 1 tablet (2.5 mg total) by mouth daily as needed. Take 1/2 hour before morning torsemide 04/02/21 07/01/21 Yes Hackney, Tina A, FNP  Multiple Vitamin (MULTI-VITAMINS) TABS Take 1 tablet by mouth daily.    Yes [provider]  omeprazole (PRILOSEC) 40 MG capsule Take 40 mg by mouth daily.   Yes [provider]  Oxycodone HCl 10 MG TABS Take 10 mg by mouth every 4 (  four) hours as needed (pain).   Yes [provider]  polyethylene glycol (MIRALAX / GLYCOLAX) 17 g packet Take 17 g by mouth daily as needed for mild constipation. 08/12/20  Yes Wieting, Richard, MD  potassium chloride (KLOR-CON) 10 MEQ tablet TAKE 1 TABLET BY MOUTH TWICE DAILY 06/12/21  Yes Gollan, Kathlene November, MD  promethazine (PHENERGAN) 25 MG tablet Take 25 mg by mouth every 8 (eight) hours as needed. 04/02/21  Yes [provider]  spironolactone (ALDACTONE) 25 MG tablet Take 0.5 tablets (12.5 mg total) by mouth daily. 10/09/20  Yes Minna Merritts, MD  tamsulosin (FLOMAX) 0.4 MG CAPS capsule Take 1 capsule (0.4 mg total) by mouth daily after supper. 07/01/20  Yes Shawna Clamp, MD  torsemide 40 MG TABS Take 40 mg by mouth 2 (two) times daily. Take 40 mg by mouth twice daily. 03/30/21 06/14/21 Yes Sreenath, Sudheer B, MD  traZODone (DESYREL) 150 MG tablet Take 150 mg by mouth at bedtime. 07/12/19  Yes [provider]  nitroGLYCERIN (NITROSTAT) 0.4 MG SL tablet Place 1 tablet (0.4 mg total) under the tongue every 5 (five) minutes as needed for chest pain. 10/09/20   Minna Merritts, MD  VERQUVO 10 MG TABS TAKE 1 TABLET BY MOUTH ONCE DAILY 11/16/20   Alisa Graff, FNP    Inpatient Medications: Scheduled Meds:  furosemide  20 mg Intravenous Q12H   Continuous Infusions:  heparin 1,350 Units/hr (06/15/21 0436)   norepinephrine (LEVOPHED) Adult infusion Stopped (06/15/21 0833)   PRN Meds: docusate sodium, polyethylene glycol  Allergies:    Allergies  Allergen Reactions   Dulaglutide Anaphylaxis, Diarrhea and Hives   Ace Inhibitors     hypotension   Other Itching    Skin itching associated with nitro patch   Prednisone Rash    Social History:   Social History   Socioeconomic History   Marital status: Single    Spouse name: Not on file   Number of children: 0   Years of education: 12   Highest education level: 12th grade  Occupational History   Occupation: on disability  Tobacco Use   Smoking status: Every Day    Packs/day: 1.00    Years: 41.00    Pack years: 41.00    Types: Cigarettes   Smokeless tobacco: Never  Vaping Use   Vaping Use: Never used  Substance and Sexual Activity   Alcohol use: No   Drug use: No   Sexual activity: Yes  Other Topics Concern   Not on file  Social History Narrative   Lives at home in [place] with girlfriend. Independent at baseline.   Social Determinants of Health   Financial Resource Strain: Not on file  Food Insecurity: Not on file  Transportation Needs: Not on file  Physical Activity: Not on file  Stress: Not on file  Social Connections: Not on file  Intimate Partner Violence: Not on file     Family History:   Family History  Problem Relation Age of Onset   Heart attack Father    Heart disease Father    Hypertension Father    Hyperlipidemia Father    Hypertension Mother    Hyperlipidemia Mother     ROS:  Review of Systems  Constitutional:  Positive for malaise/fatigue. Negative for chills, diaphoresis, fever and weight loss.  HENT:  Negative for congestion.   Eyes:  Negative for discharge and redness.  Respiratory:  Positive for cough, shortness of breath and wheezing. Negative for sputum  production.   Cardiovascular:  Positive for leg swelling. Negative for chest pain, palpitations, orthopnea, claudication and PND.  Gastrointestinal:  Negative for abdominal pain, blood in stool, heartburn, melena, nausea and vomiting.  Musculoskeletal:  Positive  for falls. Negative for myalgias.  Skin:  Negative for rash.       Nonhealing lower extremity wounds  Neurological:  Positive for weakness. Negative for dizziness, tingling, tremors, sensory change, speech change, focal weakness and loss of consciousness.  Endo/Heme/Allergies:  Does not bruise/bleed easily.  Psychiatric/Behavioral:  Negative for substance abuse. The patient is not nervous/anxious.   All other systems reviewed and are negative.    Physical Exam/Data:   Vitals:   06/15/21 0830 06/15/21 0845 06/15/21 0915 06/15/21 0930  BP: (!) 123/106 116/63 114/66 113/79  Pulse: 60 84 69 84  Resp: 15 15 16 18   Temp:      TempSrc:      SpO2: 96% (!) 89% 93% 92%  Weight:      Height:        Intake/Output Summary (Last 24 hours) at 06/15/2021 0949 Last data filed at 06/15/2021 0645 Gross per 24 hour  Intake 1500 ml  Output 1150 ml  Net 350 ml   Filed Weights   06/14/21 1553  Weight: 84.4 kg   Body mass index is 29.13 kg/m.   Physical Exam: General: Well developed, well nourished, in no acute distress. Head: Normocephalic, atraumatic, sclera non-icteric, no xanthomas, nares without discharge.  Neck: Negative for carotid bruits. JVD not elevated. Lungs: Wheezing bilaterally. Breathing is unlabored. Heart: IRIR with S1 S2. No murmurs, rubs, or gallops appreciated. Abdomen: Soft, non-tender, non-distended with normoactive bowel sounds. No hepatomegaly. No rebound/guarding. No obvious abdominal masses. Msk:  Strength and tone appear normal for age. Extremities: No clubbing or cyanosis. Left lower extremity erythematous, mild STS, and multiple wounds noted. RLE amputation noted.  Neuro: Alert and oriented X 3. No facial asymmetry. No focal deficit. Moves all extremities spontaneously. Psych:  Responds to questions appropriately with a normal affect.   EKG:  The EKG was personally reviewed and demonstrates: Afib, 84 bpm, RBBB, PVCs, and was not significantly changed when compared to  prior tracing Telemetry:  Telemetry was personally reviewed and demonstrates: Afib, 80s bpm  Weights: Filed Weights   06/14/21 1553  Weight: 84.4 kg    Relevant CV Studies:  2D echo 03/26/2021: 1. Left ventricular ejection fraction, by estimation, is 20 to 25%. The  left ventricle has severely decreased function. The left ventricle  demonstrates global hypokinesis. The left ventricular internal cavity size  was severely dilated. Left ventricular  diastolic function could not be evaluated.   2. Right ventricular systolic function is severely reduced. The right  ventricular size is mildly enlarged. There is severely elevated pulmonary  artery systolic pressure.   3. Left atrial size was mildly dilated.   4. Right atrial size was severely dilated.   5. The mitral valve is abnormal. Moderate mitral valve regurgitation.   6. Tricuspid valve regurgitation is moderate to severe.   7. The aortic valve has an indeterminant number of cusps. There is  moderate calcification of the aortic valve. There is moderate thickening  of the aortic valve. Aortic valve regurgitation is not visualized. There  appears to be mild aortic stenosis, with   LVOT gradient underestimated due to low LVEF.   8. The inferior vena cava is normal in size with <50% respiratory  variability, suggesting right atrial pressure of 8 mmHg. __________  2D  echo 06/17/2020:  1. Left ventricular ejection fraction, by estimation, is <20%. Left  ventricular ejection fraction by PLAX is 14 %. The left ventricle has  severely decreased function. The left ventricle demonstrates global  hypokinesis. The left ventricular internal  cavity size was severely dilated. Left ventricular diastolic parameters  are indeterminate.   2. Right ventricular systolic function is severely reduced. The right  ventricular size is moderately enlarged. There is severely elevated  pulmonary artery systolic pressure. The estimated right ventricular   systolic pressure is 37.1 mmHg.   3. Left atrial size was severely dilated.   4. Right atrial size was severely dilated.   5. Tricuspid valve regurgitation is mild to moderate.   6. The inferior vena cava is dilated in size with <50% respiratory  variability, suggesting right atrial pressure of 15 mmHg. __________  LHC 01/20/2020: Prox RCA-1 lesion is 80% stenosed. Prox RCA-2 lesion is 20% stenosed. Prox RCA to Mid RCA lesion is 80% stenosed. Prox LAD lesion is 20% stenosed. 1st Diag lesion is 80% stenosed. Mid LAD lesion is 20% stenosed. Dist LAD lesion is 60% stenosed. Prox Cx to Mid Cx lesion is 90% stenosed. LPAV lesion is 95% stenosed.   1. Significant underlying three-vessel coronary artery disease. Patent proximal LAD stent with mild in-stent restenosis. Moderate diffuse disease in the distal LAD. Significant stenosis in the proximal left circumflex at the origin of the posterior AV groove artery which is also heavily diseased at the ostium. This is a bifurcation lesion and heavily calcified. RCA stent is patent. However, there is significant proximal disease in the whole mid to distal segment is diffusely diseased and small caliber. 2. Left ventricular angiography was not performed. EF was severe reduced by echo. 3. Moderately elevated left ventricular end-diastolic pressure at 28 mmHg   Recommendations: The patient's cardiomyopathy seems to be out of proportion to his coronary artery disease as the LAD itself does not seem to have obstructive disease at the present time. The RCA is diffusely diseased and too small to stent. The only target for revascularization would be the left circumflex but it is a calcified bifurcation lesion and likely requires atherectomy. In order to do this safely, the patient has to be optimized from a heart failure standpoint as he appears to be volume overloaded. Recommend intravenous diuresis. If limited by hypotension, we might need to consider  inotropic support and small dose norepinephrine drip initially. I added small dose digoxin. Treat underlying atrial fibrillation. Resume heparin drip 8 hours after sheath pull. __________  2D echo 01/20/2020: 1. Left ventricular ejection fraction, by estimation, is <20%. The left  ventricle has severely decreased function. The left ventricle demonstrates  global hypokinesis. The left ventricular internal cavity size was  moderately dilated. Left ventricular  diastolic parameters are indeterminate.   2. Right ventricular systolic function is moderately reduced. The right  ventricular size is moderately enlarged. There is normal pulmonary artery  systolic pressure. The estimated right ventricular systolic pressure is  69.6 mmHg.   3. Left atrial size was mildly dilated.   4. Mild mitral valve regurgitation.   5. Tricuspid valve regurgitation is moderate. __________  2D echo 09/28/2019: 1. Left ventricular ejection fraction, by estimation, is 20 to 25%. The  left ventricle has severely decreased function. The left ventricle  demonstrates global hypokinesis. The left ventricular internal cavity size  was severely dilated. There is mild  left ventricular hypertrophy. Indeterminate diastolic filling due to E-A  fusion.   2. Right ventricular  systolic function is moderately reduced. The right  ventricular size is mildly enlarged. There is moderately elevated  pulmonary artery systolic pressure. The estimated right ventricular  systolic pressure is 16.1 mmHg.   3. Left atrial size was mildly dilated.   4. Right atrial size was moderately dilated.   5. The mitral valve is degenerative. Mild mitral valve regurgitation. No  evidence of mitral stenosis.   6. Tricuspid valve regurgitation is mild to moderate.   7. The aortic valve was not well visualized. Aortic valve regurgitation  is not visualized. Mild aortic valve sclerosis is present, with no  evidence of aortic valve stenosis.   8. The  inferior vena cava is dilated in size with <50% respiratory  variability, suggesting right atrial pressure of 15 mmHg. ___________  2D echo 11/06/2018: 1. The left ventricle has severely reduced systolic function, with an  ejection fraction of 20-25%. The cavity size was moderately dilated.  Indeterminate diastolic filling due to E-A fusion. Left ventricular  diffuse hypokinesis.   2. The right ventricle has severely reduced systolic function. The cavity  was mildly enlarged. There is no increase in right ventricular wall  thickness. Right ventricular systolic pressure is moderately elevated with  an estimated pressure of 59.6 mmHg.   3. Left atrial size was mildly dilated.   4. Right atrial size was mildly dilated.   5. The mitral valve is degenerative. Mild thickening of the mitral valve  leaflet. Mild calcification of the mitral valve leaflet. There is mild  mitral annular calcification present.   6. The tricuspid valve is degenerative. Tricuspid valve regurgitation is  moderate.   7. The aortic valve was not well visualized. Moderate thickening of the  aortic valve. Moderate calcification of the aortic valve.   8. The inferior vena cava was dilated in size with <50% respiratory  variability. __________  LHC 04/2017: Prox RCA-1 lesion is 70% stenosed. Prox RCA-2 lesion is 20% stenosed. Mid RCA lesion is 80% stenosed. Mid RCA to Dist RCA lesion is 70% stenosed. Ost LAD to Prox LAD lesion is 30% stenosed. Ost 1st Diag to 1st Diag lesion is 80% stenosed. Prox LAD to Mid LAD lesion is 60% stenosed. Dist LAD lesion is 60% stenosed. Prox Cx to Mid Cx lesion is 90% stenosed. Ost 1st Mrg to 1st Mrg lesion is 85% stenosed.   1.  Significant three-vessel coronary artery disease with patent stent in the RCA and LAD.  There is significant proximal RCA disease before the stent as well as diffuse mid and distal disease in a vessel that appears to be about 2.5 mm in diameter.  There is  significant bifurcation stenosis in the proximal left circumflex with a large OM1.  The LAD has moderate disease. The coronary arteries are moderately calcified and diffusely diseased throughout. 2.  Left ventricular angiography was not performed.  EF was moderately to severely reduced by echo. 3.  Severely elevated left ventricular end-diastolic pressure at 34 mmHg.   Recommendations: This is overall a difficult situation.  Revascularization options are somewhat limited due to diffuse disease overall.  Given that he is diabetic and has cardiomyopathy, best option might be CABG if he is found to be a candidate.  We need to optimize his heart failure first with outpatient referral to cardiothoracic surgery for evaluation.  I am going to increase intravenous diuresis today and the patient possibly can be discharged home tomorrow on medical therapy.  Laboratory Data:  Chemistry Recent Labs  Lab 06/14/21 1557 06/15/21  0347  NA 131* 132*  K 4.5 4.8  CL 95* 98  CO2 30 25  GLUCOSE 107* 248*  BUN 28* 29*  CREATININE 1.33* 1.24  CALCIUM 8.9 8.5*  GFRNONAA >60 >60  ANIONGAP 6 9    Recent Labs  Lab 06/14/21 1557  PROT 7.6  ALBUMIN 3.7  AST 20  ALT 20  ALKPHOS 93  BILITOT 2.1*   Hematology Recent Labs  Lab 06/14/21 1557 06/15/21 0347  WBC 5.6 4.3  RBC 3.79* 3.79*  HGB 10.3* 10.2*  HCT 31.4* 31.2*  MCV 82.8 82.3  MCH 27.2 26.9  MCHC 32.8 32.7  RDW 22.1* 22.0*  PLT 173 183   Cardiac EnzymesNo results for input(s): TROPONINI in the last 168 hours. No results for input(s): TROPIPOC in the last 168 hours.  BNP Recent Labs  Lab 06/14/21 1908  BNP 2,270.8*    DDimer No results for input(s): DDIMER in the last 168 hours.  Radiology/Studies:  DG Chest 2 View  Result Date: 06/14/2021 IMPRESSION: 1. No acute abnormality. 2. Stable cardiomegaly. 3. Chronic hyperinflation and peribronchial thickening consistent with provided history of COPD. Electronically Signed   By: Keith Rake M.D.   On: 06/14/2021 19:29   DG Tibia/Fibula Left  Result Date: 06/14/2021 IMPRESSION: Normal left tibia and fibula. Electronically Signed   By: Marijo Conception M.D.   On: 06/14/2021 16:49   DG Foot Complete Left  Result Date: 06/14/2021 IMPRESSION: No acute abnormality seen. Electronically Signed   By: Marijo Conception M.D.   On: 06/14/2021 16:51    Assessment and Plan:   1. Shock: -Extremities warm and dry, less likely cardiogenic in etiology -Levophed held this AM with stable BP -Cannot exclude septic etiology   2. Chronic combined systolic and diastolic CHF/ICM/pulmonary hypertension: -He does not appear grossly volume up -IV Lasix for now -Ideally, would avoid midodrine  -PTA GDMT held given hypotension requiring vasopressor support, resume as able -Strict I/O  3. CAD with elevated troponin: -No chest pain -Mildly elevated high sensitivity troponin in the ED, cycle to trend -Possibly related to supply demand ischemia in the setting of shock, lower extremity cellulitis with diabetic ulcer, COPD exacerbation, acute on CKD, with underlying CAD -Heparin gtt -Recommend continue PTA ASA, Plavix, Lipitor, and Zetia -Imdur on hold with hypotension   4. Permanent Afib: -Ventricular rates reasonably controlled  -Coreg held as above -Digoxin on hold with AKI -CHADS2VASc at least 4 (CHF, HTN, DM, vascular disease) -IV heparin in place of PTA DOAC -Resume DOAC prior to discharge   5. PAD/DM with diabetic wounds: -Per primary service         For questions or updates, please contact [place] Please consult [email or web address] for contact info under Cardiology/STEMI.   Signed, Christell Faith, PA-C [facility] Pager: [phone] 06/15/2021, 9:49 AM

## 2021-06-15 NOTE — Progress Notes (Signed)
PT Cancellation Note  Patient Details Name: [name] [name] MRN: [id number] DOB: 10-Mar-1961   Cancelled Treatment:    Reason Eval/Treat Not Completed: OT screened, no needs identified, will sign off;Other (comment);PT screened, no needs identified, will sign off. Orders received and chart reviewed. Per OT pt is at baseline function. Is independent in donning prosthesis and with transfers surface to surface as pt relies on manual and/or power w/c for OOB mobility. Not acute PT needs, will sign off.   [name] Caster. Fairly IV, PT, DPT Physical Therapist- [facility]  06/15/2021, 10:40 AM

## 2021-06-15 NOTE — Consult Note (Signed)
PHARMACY CONSULT NOTE - FOLLOW UP  Pharmacy Consult for Electrolyte Monitoring and Replacement   Recent Labs: Potassium (mmol/L)  Date Value  06/15/2021 4.8  06/08/2014 4.3   Magnesium (mg/dL)  Date Value  06/15/2021 1.4 (L)  02/06/2014 1.4 (L)   Calcium (mg/dL)  Date Value  06/15/2021 8.5 (L)   Calcium, Total (mg/dL)  Date Value  06/08/2014 9.2   Albumin (g/dL)  Date Value  06/14/2021 3.7  01/29/2018 3.3 (L)  03/07/2014 3.9   Phosphorus (mg/dL)  Date Value  06/15/2021 4.4   Sodium (mmol/L)  Date Value  06/15/2021 132 (L)  04/17/2021 129 (L)  06/08/2014 136     Assessment: 61 y/o male presented 06/14/21 with complaints of diabetic wound infection and acute on chronic HF exacerbation. Pt with PMH of afib. Pharmacy to monitor and replace electrolytes as needed  Medications: --Lasix 20 mg IV Q12H  Goal of Therapy:  K: 4-5.1 Mag: 2-2.4 All other electrolytes WNL  Plan:  Hyponatremia: correction per provider, unsure of volume status given elevated BNP On IV lasix 20 mg BID Mag 1.4 : will order IV mag 4 gram x 1  Recheck electrolytes with AM labs  Dorothe Pea ,PharmD, BCPS Clinical Pharmacist 06/15/2021 4:35 AM

## 2021-06-15 NOTE — Consult Note (Signed)
ANTICOAGULATION CONSULT NOTE   Pharmacy Consult for Heparin Indication: chest pain/ACS  Allergies  Allergen Reactions   Dulaglutide Anaphylaxis, Diarrhea and Hives   Ace Inhibitors     hypotension   Other Itching    Skin itching associated with nitro patch   Prednisone Rash    Patient Measurements: Height: 5\' 7"  (170.2 cm) Weight: 84.4 kg (186 lb) IBW/kg (Calculated) : 66.1 Heparin Dosing Weight: 83.1 kg  Vital Signs: BP: 101/79 (01/06 0330) Pulse Rate: 81 (01/06 0330)  Labs: Recent Labs    06/14/21 1557 06/14/21 1908 06/14/21 2015 06/14/21 2052 06/14/21 2135 06/15/21 0347  HGB 10.3*  --   --   --   --  10.2*  HCT 31.4*  --   --   --   --  31.2*  PLT 173  --   --   --   --  183  APTT  --   --  34  --  170* 53*  LABPROT  --   --  14.9  --  16.5*  --   INR  --   --  1.2  --  1.3*  --   HEPARINUNFRC  --   --   --   --  0.91* 0.12*  CREATININE 1.33*  --   --   --   --  1.24  TROPONINIHS  --  341*  --  352*  --   --      Estimated Creatinine Clearance: 67.4 mL/min (by C-G formula based on SCr of 1.24 mg/dL).   Medical History: Past Medical History:  Diagnosis Date   Acute on chronic heart failure ([place])    AKI (acute kidney injury) ([place]) 01/03/2018   Angina at rest [facility]) 08/13/2017   Arthritis    Asthma    Atherosclerosis    BPH (benign prostatic hyperplasia)    Carotid arterial disease ([place])    Cellulitis of foot, left 09/27/2019   Charcot's joint of foot, right    Chronic combined systolic (congestive) and diastolic (congestive) heart failure ([place])    a. 02/2013 EF 50% by LV gram; b. 04/2017 Echo: EF 25-30%. diff HK. Gr2 DD; c. 06/2020 Echo: EF <20%, glob HK. RVS 53.5mmHg. Sev BAE. Mild to mod TR.   Chronic foot pain (Secondary Area of Pain) (Right) 01/29/2018   COPD (chronic obstructive pulmonary disease) (HCC)    COPD with acute exacerbation ([place]) 01/30/2020   Coronary artery disease    a. 2013 S/P PCI of LAD [facility]);  b. 03/2013 PCI: RCA 90p ( 2.5 x 23  mm DES); c. 02/2014 Cath: patent RCA stent->Med Rx; d. 04/2017 Cath: LM nl, LAd 30ost/p, 59m/d, D1 80ost, LCX 90p/m, OM1 85, RCA 70/20p, 32m, 70d->Referred for CT Surg-felt to be poor candidate; d. 01/2020 Cath: LM nl, LAD 20p/m, 60d. D1 80. LCX 90p/m. LPAV 95, RCA 80/20p, 80p/m.   Diabetic neuropathy ([place])    Diabetic ulcer of right foot ([place])    Hernia    HFrEF (heart failure with reduced ejection fraction) ([place]) 01/30/2020   Hyperlipidemia    Hypertension    Ischemic cardiomyopathy    a. 02/2013 EF 50% by LV gram; b. 04/2017 Echo: EF 25-30%. diff HK. Gr2 DD; c. 06/2020 Echo: EF <20%, glob HK. RVS 53.45mmHg. Sev BAE. Mild to mod TR.   Morbid obesity ([place])    Neuropathy    PAD (peripheral artery disease) ([place])    a. Followed by Dr. Lucky Cowboy; b. 08/2010 Periph Angio: RSFA 70-80p (6X60 self-expanding  stent); c. 09/2010 Periph Angio: L SFA 100p (7x unknown length self-expanding stent); d. 06/2015 Periph Angio: R SFA short segment occlusion (6x12 self-expanding stent).   Persistent atrial fibrillation (HCC)    Restless leg syndrome    Sepsis ([place]) 03/30/2018   Sleep apnea    Subclavian artery stenosis, right (HCC)    Syncope and collapse    Tobacco use    a. 75+ yr hx - still smoking 1ppd, down from 2 ppd.   Type II diabetes mellitus ([place])    Unstable angina ([place]) 07/11/2016   Varicose vein     Medications:  (Not in a hospital admission) Scheduled:   furosemide  20 mg Intravenous Q12H   Infusions:   heparin 1,050 Units/hr (06/14/21 2327)   magnesium sulfate bolus IVPB     norepinephrine (LEVOPHED) Adult infusion 4 mcg/min (06/14/21 2118)   PRN:  Anti-infectives (From admission, onward)    Start     Dose/Rate Route Frequency Ordered Stop   06/14/21 2045  cefTRIAXone (ROCEPHIN) 1 g in sodium chloride 0.9 % 100 mL IVPB        1 g 200 mL/hr over 30 Minutes Intravenous  Once 06/14/21 2035 06/14/21 2111   06/14/21 1615  vancomycin (VANCOREADY) IVPB 2000 mg/400 mL        2,000 mg 200 mL/hr over  120 Minutes Intravenous  Once 06/14/21 1607 06/14/21 1852       Assessment: Pharmacy consulted for heparin for ACS. Pt is on apixaban for afib. Will use aPTT for monitoring and order heparin level for baseline.    1/5 2132 BASELINE aPTT 170 sec, HL 0.91 -- drawn shortly after initiation of heparin-- suspect error in collection 1/6 0347 aPTT 53 sec, HL 0.12 SUBtherapeutic  Goal of Therapy:  Heparin level 0.3-0.7 units/ml aPTT 65-102 seconds.  Monitor platelets by anticoagulation protocol: Yes   Plan:  Heparin subtherapeutic  Give 2500 units bolus x 1 Increase heparin infusion to 1350 units/hr Check anti-Xa level in 6 hours following rate change Continue to monitor H&H and platelets  Dorothe Pea, PharmD, BCPS Clinical Pharmacist   06/15/2021,4:28 AM

## 2021-06-15 NOTE — ED Notes (Signed)
Informed pt that still need order for requested pain meds. Pt upset. This nurse unable to place PRN IV morphine. Attempting to obtain BP.

## 2021-06-15 NOTE — ED Notes (Signed)
Patient encouraged to keep oxygen in place

## 2021-06-15 NOTE — Progress Notes (Signed)
CRITICAL CARE PROGRESS NOTE    Name: [name] [name] MRN: [id number] DOB: [birth year]     LOS: 1   SUBJECTIVE FINDINGS & SIGNIFICANT EVENTS    Patient description:  61 y.o. male with history of CAD, ischemic cardiomyopathy, COPD, hypertension, permanent atrial fibrillation, DM, CKD stage III, HLD, OSA, BPH, Acute on chronic systolic CHF (EF 65-03%),TWSFKCL abuse medication noncompliance, leaving AMA from the hospital who presented to the ED with chief complaints of increased pain redness and swelling of the LLE for the past 2 weeks.   Patient was recently admitted ON 03/30/2021 with cardiogenic shock secondary to acute on chronic CHF but left AMA. He was previously admitted with LLE cellulitis  but report that his leg continues to bother him with purulent discharge and increase erythema. He fell yesterday due to weakness and pain in his leg. Patient also report worsening productive cough with exertional chest discomfort and shortness of breath.  06/15/20- patient is improved clinically, he still is hypotensive with arrythmia. He wants to go home.  He still requires vasopressors.   Lines/tubes :   Microbiology/Sepsis markers: Results for orders placed or performed during the hospital encounter of 06/14/21  Resp Panel by RT-PCR (Flu A&B, Covid) Nasopharyngeal Swab     Status: None   Collection Time: 06/14/21  7:07 PM   Specimen: Nasopharyngeal Swab; Nasopharyngeal(NP) swabs in vial transport medium  Result Value Ref Range Status   SARS Coronavirus 2 by RT PCR NEGATIVE NEGATIVE Final    Comment: (NOTE) SARS-CoV-2 target nucleic acids are NOT DETECTED.  The SARS-CoV-2 RNA is generally detectable in upper respiratory specimens during the acute phase of infection. The lowest concentration of SARS-CoV-2 viral copies this  assay can detect is 138 copies/mL. A negative result does not preclude SARS-Cov-2 infection and should not be used as the sole basis for treatment or other patient management decisions. A negative result may occur with  improper specimen collection/handling, submission of specimen other than nasopharyngeal swab, presence of viral mutation(s) within the areas targeted by this assay, and inadequate number of viral copies(<138 copies/mL). A negative result must be combined with clinical observations, patient history, and epidemiological information. The expected result is Negative.  Fact Sheet for Patients:  EntrepreneurPulse.com.au  Fact Sheet for Healthcare Providers:  IncredibleEmployment.be  This test is no t yet approved or cleared by the [place] FDA and  has been authorized for detection and/or diagnosis of SARS-CoV-2 by FDA under an Emergency Use Authorization (EUA). This EUA will remain  in effect (meaning this test can be used) for the duration of the COVID-19 declaration under Section 564(b)(1) of the Act, 21 U.S.C.section 360bbb-3(b)(1), unless the authorization is terminated  or revoked sooner.       Influenza A by PCR NEGATIVE NEGATIVE Final   Influenza B by PCR NEGATIVE NEGATIVE Final    Comment: (NOTE) The Xpert Xpress SARS-CoV-2/FLU/RSV plus assay is intended as an aid in the diagnosis of influenza from Nasopharyngeal swab specimens and should not be used as a sole basis for treatment. Nasal washings and aspirates are unacceptable for Xpert Xpress SARS-CoV-2/FLU/RSV testing.  Fact Sheet for Patients: EntrepreneurPulse.com.au  Fact Sheet for Healthcare Providers: IncredibleEmployment.be  This test is not yet approved or cleared by the [place] FDA and has been authorized for detection and/or diagnosis of SARS-CoV-2 by FDA under an Emergency Use Authorization (EUA). This EUA will  remain in effect (meaning this test can be used) for the duration of the COVID-19 declaration under Section  564(b)(1) of the Act, 21 U.S.C. section 360bbb-3(b)(1), unless the authorization is terminated or revoked.  Performed at [facility], [address] [place], [place] [address]   Blood culture (single)     Status: None (Preliminary result)   Collection Time: 06/14/21  8:00 PM   Specimen: BLOOD  Result Value Ref Range Status   Specimen Description BLOOD BLOOD RIGHT HAND  Final   Special Requests   Final    BOTTLES DRAWN AEROBIC AND ANAEROBIC Blood Culture adequate volume   Culture   Final    NO GROWTH < 12 HOURS Performed at [facility], [address], [place], [place] [address]    Report Status PENDING  Incomplete    Anti-infectives:  Anti-infectives (From admission, onward)    Start     Dose/Rate Route Frequency Ordered Stop   06/14/21 2045  cefTRIAXone (ROCEPHIN) 1 g in sodium chloride 0.9 % 100 mL IVPB        1 g 200 mL/hr over 30 Minutes Intravenous  Once 06/14/21 2035 06/14/21 2111   06/14/21 1615  vancomycin (VANCOREADY) IVPB 2000 mg/400 mL        2,000 mg 200 mL/hr over 120 Minutes Intravenous  Once 06/14/21 1607 06/14/21 1852        Consults: Treatment Team:  Pccm, Ander Gaster, MD Wellington Hampshire, MD       PAST MEDICAL HISTORY   Past Medical History:  Diagnosis Date   Acute on chronic heart failure ([place])    AKI (acute kidney injury) ([place]) 01/03/2018   Angina at rest [facility]) 08/13/2017   Arthritis    Asthma    Atherosclerosis    BPH (benign prostatic hyperplasia)    Carotid arterial disease ([place])    Cellulitis of foot, left 09/27/2019   Charcot's joint of foot, right    Chronic combined systolic (congestive) and diastolic (congestive) heart failure ([place])    a. 02/2013 EF 50% by LV gram; b. 04/2017 Echo: EF 25-30%. diff HK. Gr2 DD; c. 06/2020 Echo: EF <20%, glob HK. RVS 53.31mHg. Sev BAE. Mild to mod TR.   Chronic foot  pain (Secondary Area of Pain) (Right) 01/29/2018   COPD (chronic obstructive pulmonary disease) (HCC)    COPD with acute exacerbation ([place] 01/30/2020   Coronary artery disease    a. 2013 S/P PCI of LAD ([facility];  b. 03/2013 PCI: RCA 90p ( 2.5 x 23 mm DES); c. 02/2014 Cath: patent RCA stent->Med Rx; d. 04/2017 Cath: LM nl, LAd 30ost/p, 625m, D1 80ost, LCX 90p/m, OM1 85, RCA 70/20p, 8039m0d->Referred for CT Surg-felt to be poor candidate; d. 01/2020 Cath: LM nl, LAD 20p/m, 60d. D1 80. LCX 90p/m. LPAV 95, RCA 80/20p, 80p/m.   Diabetic neuropathy ([place]  Diabetic ulcer of right foot ([place]  Hernia    HFrEF (heart failure with reduced ejection fraction) ([place]CC[place]8/22/2021   Hyperlipidemia    Hypertension    Ischemic cardiomyopathy    a. 02/2013 EF 50% by LV gram; b. 04/2017 Echo: EF 25-30%. diff HK. Gr2 DD; c. 06/2020 Echo: EF <20%, glob HK. RVS 53.2mm44m Sev BAE. Mild to mod TR.   Morbid obesity (HCC)[place] Neuropathy    PAD (peripheral artery disease) (HCC)[place] a. Followed by Dr. Dew;[place] 08/2010 Periph Angio: RSFA 70-80p (6X60 self-expanding stent); c. 09/2010 Periph Angio: L SFA 100p (7x unknown length self-expanding stent); d. 06/2015 Periph Angio: R SFA short segment occlusion (6x12 self-expanding stent).  Persistent atrial fibrillation (HCC)    Restless leg syndrome    Sepsis ([place]) 03/30/2018   Sleep apnea    Subclavian artery stenosis, right (HCC)    Syncope and collapse    Tobacco use    a. 75+ yr hx - still smoking 1ppd, down from 2 ppd.   Type II diabetes mellitus (HCC)    Unstable angina ([place]) 07/11/2016   Varicose vein      SURGICAL HISTORY   Past Surgical History:  Procedure Laterality Date   ABDOMINAL AORTAGRAM N/A 06/23/2013   Procedure: ABDOMINAL Maxcine Ham;  Surgeon: Wellington Hampshire, MD;  Location: [place] CATH LAB;  Service: Cardiovascular;  Laterality: N/A;   AMPUTATION Right 04/01/2018   Procedure: AMPUTATION BELOW KNEE;  Surgeon: Algernon Huxley, MD;  Location: [facility] ORS;  Service:  Vascular;  Laterality: Right;   AMPUTATION TOE Left 08/11/2020   Procedure: AMPUTATION TOE-2nd Toe;  Surgeon: Samara Deist, DPM;  Location: [facility] ORS;  Service: Podiatry;  Laterality: Left;   APPENDECTOMY     CARDIAC CATHETERIZATION  10/14   [place]; X1 STENT PROXIMAL RCA   CARDIAC CATHETERIZATION  02/2010   [facility]   CARDIAC [facility]IZATION  02/21/2014   [facility]   CLAVICLE SURGERY     HERNIA REPAIR     IRRIGATION AND DEBRIDEMENT FOOT Right 01/04/2018   Procedure: IRRIGATION AND DEBRIDEMENT FOOT;  Surgeon: Samara Deist, DPM;  Location: [facility] ORS;  Service: Podiatry;  Laterality: Right;   LEFT HEART CATH AND CORONARY ANGIOGRAPHY N/A 01/20/2020   Procedure: LEFT HEART CATH AND CORONARY ANGIOGRAPHY;  Surgeon: Wellington Hampshire, MD;  Location: [place] CV LAB;  Service: Cardiovascular;  Laterality: N/A;   LEFT HEART CATH AND CORS/GRAFTS ANGIOGRAPHY N/A 04/28/2017   Procedure: LEFT HEART CATH AND CORONARY ANGIOGRAPHY;  Surgeon: Wellington Hampshire, MD;  Location: [place] CV LAB;  Service: Cardiovascular;  Laterality: N/A;   LOWER EXTREMITY ANGIOGRAPHY Right 03/03/2018   Procedure: LOWER EXTREMITY ANGIOGRAPHY;  Surgeon: Katha Cabal, MD;  Location: [place] CV LAB;  Service: Cardiovascular;  Laterality: Right;   LOWER EXTREMITY ANGIOGRAPHY Left 08/24/2018   Procedure: LOWER EXTREMITY ANGIOGRAPHY;  Surgeon: Algernon Huxley, MD;  Location: [place] CV LAB;  Service: Cardiovascular;  Laterality: Left;   LOWER EXTREMITY ANGIOGRAPHY Left 06/14/2019   Procedure: LOWER EXTREMITY ANGIOGRAPHY;  Surgeon: Algernon Huxley, MD;  Location: [place] CV LAB;  Service: Cardiovascular;  Laterality: Left;   LOWER EXTREMITY ANGIOGRAPHY Left 08/02/2019   Procedure: LOWER EXTREMITY ANGIOGRAPHY;  Surgeon: Algernon Huxley, MD;  Location: [place] CV LAB;  Service: Cardiovascular;  Laterality: Left;   LOWER EXTREMITY ANGIOGRAPHY Left 07/17/2020   Procedure: LOWER EXTREMITY ANGIOGRAPHY;  Surgeon: Algernon Huxley, MD;   Location: [place] CV LAB;  Service: Cardiovascular;  Laterality: Left;   PERIPHERAL ARTERIAL STENT GRAFT     x2 left/right   PERIPHERAL VASCULAR BALLOON ANGIOPLASTY Right 01/06/2018   Procedure: PERIPHERAL VASCULAR BALLOON ANGIOPLASTY;  Surgeon: Katha Cabal, MD;  Location: [place] CV LAB;  Service: Cardiovascular;  Laterality: Right;   PERIPHERAL VASCULAR CATHETERIZATION N/A 07/06/2015   Procedure: Abdominal Aortogram w/Lower Extremity;  Surgeon: Algernon Huxley, MD;  Location: [place] CV LAB;  Service: Cardiovascular;  Laterality: N/A;   PERIPHERAL VASCULAR CATHETERIZATION  07/06/2015   Procedure: Lower Extremity Intervention;  Surgeon: Algernon Huxley, MD;  Location: [place] CV LAB;  Service: Cardiovascular;;     FAMILY HISTORY   Family History  Problem Relation Age of Onset  Heart attack Father    Heart disease Father    Hypertension Father    Hyperlipidemia Father    Hypertension Mother    Hyperlipidemia Mother      SOCIAL HISTORY   Social History   Tobacco Use   Smoking status: Every Day    Packs/day: 1.00    Years: 41.00    Pack years: 41.00    Types: Cigarettes   Smokeless tobacco: Never  Vaping Use   Vaping Use: Never used  Substance Use Topics   Alcohol use: No   Drug use: No     MEDICATIONS   Current Medication:  Current Facility-Administered Medications:    docusate sodium (COLACE) capsule 100 mg, 100 mg, Oral, BID PRN, Lang Snow, NP   furosemide (LASIX) injection 20 mg, 20 mg, Intravenous, Q12H, [place], Bing Neighbors, NP, 20 mg at 06/15/21 1029   heparin ADULT infusion 100 units/mL (25000 units/253m), 1,350 Units/hr, Intravenous, Continuous, DDorothe Pea RPH, Last Rate: 13.5 mL/hr at 06/15/21 0436, 1,350 Units/hr at 06/15/21 0436   norepinephrine (LEVOPHED) 430min 25044m0.016 mg/mL) premix infusion, 0-40 mcg/min, Intravenous, Continuous, SmiVladimir CroftsD, Stopped at 06/15/21 0830347polyethylene glycol  (MIRALAX / GLYCOLAX) packet 17 g, 17 g, Oral, Daily PRN, OumLang SnowP  Current Outpatient Medications:    albuterol (VENTOLIN HFA) 108 (90 Base) MCG/ACT inhaler, Inhale into the lungs every 6 (six) hours as needed for wheezing or shortness of breath., Disp: , Rfl:    apixaban (ELIQUIS) 5 MG TABS tablet, Take 5 mg by mouth 2 (two) times daily., Disp: , Rfl:    aspirin 81 MG EC tablet, Take 81 mg by mouth daily., Disp: , Rfl:    atorvastatin (LIPITOR) 80 MG tablet, TAKE 1 TABLET BY MOUTH ONCE DAILY, Disp: 90 tablet, Rfl: 0   carvedilol (COREG) 3.125 MG tablet, TAKE 1 TABLET BY MOUTH TWICE DAILY WITH MEALS, Disp: 180 tablet, Rfl: 0   clopidogrel (PLAVIX) 75 MG tablet, TAKE 1 TABLET BY MOUTH ONCE DAILY, Disp: 90 tablet, Rfl: 0   digoxin (LANOXIN) 0.125 MG tablet, TAKE 1 TABLET BY MOUTH ONCE DAILY, Disp: 90 tablet, Rfl: 0   DULoxetine (CYMBALTA) 60 MG capsule, Take 60 mg by mouth daily., Disp: , Rfl:    ezetimibe (ZETIA) 10 MG tablet, Take 1 tablet (10 mg total) by mouth daily., Disp: 90 tablet, Rfl: 3   Fluticasone-Umeclidin-Vilant 100-62.5-25 MCG/INH AEPB, Inhale 1 puff into the lungs daily., Disp: , Rfl:    gabapentin (NEURONTIN) 800 MG tablet, Take 800 mg by mouth 4 (four) times daily. , Disp: , Rfl:    ipratropium-albuterol (DUONEB) 0.5-2.5 (3) MG/3ML SOLN, Inhale 3 mLs into the lungs every 6 (six) hours as needed (wheezing/sob). , Disp: , Rfl:    isosorbide mononitrate (IMDUR) 30 MG 24 hr tablet, Take 1 tablet (30 mg total) by mouth daily., Disp: 90 tablet, Rfl: 3   losartan (COZAAR) 25 MG tablet, Take 0.5 tablets (12.5 mg total) by mouth daily., Disp: 46 tablet, Rfl: 3   metFORMIN (GLUCOPHAGE-XR) 500 MG 24 hr tablet, Take 1,000 mg by mouth 2 (two) times daily., Disp: , Rfl:    metolazone (ZAROXOLYN) 2.5 MG tablet, Take 1 tablet (2.5 mg total) by mouth daily as needed. Take 1/2 hour before morning torsemide, Disp: 10 tablet, Rfl: 0   Multiple Vitamin (MULTI-VITAMINS) TABS, Take 1  tablet by mouth daily. , Disp: , Rfl:    omeprazole (PRILOSEC) 40 MG capsule, Take 40 mg by mouth  daily., Disp: , Rfl:    Oxycodone HCl 10 MG TABS, Take 10 mg by mouth every 4 (four) hours as needed (pain)., Disp: , Rfl:    polyethylene glycol (MIRALAX / GLYCOLAX) 17 g packet, Take 17 g by mouth daily as needed for mild constipation., Disp: 14 each, Rfl: 0   potassium chloride (KLOR-CON) 10 MEQ tablet, TAKE 1 TABLET BY MOUTH TWICE DAILY, Disp: 180 tablet, Rfl: 0   promethazine (PHENERGAN) 25 MG tablet, Take 25 mg by mouth every 8 (eight) hours as needed., Disp: , Rfl:    spironolactone (ALDACTONE) 25 MG tablet, Take 0.5 tablets (12.5 mg total) by mouth daily., Disp: 90 tablet, Rfl: 3   tamsulosin (FLOMAX) 0.4 MG CAPS capsule, Take 1 capsule (0.4 mg total) by mouth daily after supper., Disp: 30 capsule, Rfl: 1   torsemide 40 MG TABS, Take 40 mg by mouth 2 (two) times daily. Take 40 mg by mouth twice daily., Disp: 60 tablet, Rfl: 0   traZODone (DESYREL) 150 MG tablet, Take 150 mg by mouth at bedtime., Disp: , Rfl:    nitroGLYCERIN (NITROSTAT) 0.4 MG SL tablet, Place 1 tablet (0.4 mg total) under the tongue every 5 (five) minutes as needed for chest pain., Disp: 30 tablet, Rfl: 5   VERQUVO 10 MG TABS, TAKE 1 TABLET BY MOUTH ONCE DAILY, Disp: 30 tablet, Rfl: 5    ALLERGIES   Dulaglutide, Ace inhibitors, Other, and Prednisone    REVIEW OF SYSTEMS     10 point ROS done and is negative except for wheezing and chest discomfort worse on left.   PHYSICAL EXAMINATION   Vital Signs: Temp:  [98.4 F (36.9 C)] 98.4 F (36.9 C) (01/05 1553) Pulse Rate:  [53-96] 74 (01/06 1030) Resp:  [8-29] 20 (01/06 1030) BP: (59-132)/(35-106) 102/82 (01/06 1030) SpO2:  [86 %-100 %] 93 % (01/06 1030) Weight:  [84.4 kg] 84.4 kg (01/05 1553)  GENERAL:Age appropriate HEAD: Normocephalic, atraumatic.  EYES: Pupils equal, round, reactive to light.  No scleral icterus.  MOUTH: Moist mucosal membrane. NECK:  Supple. No thyromegaly. No nodules. No JVD.  PULMONARY: left sided expiratory wheezing CARDIOVASCULAR: S1 and S2. Regular rate and rhythm. No murmurs, rubs, or gallops.  GASTROINTESTINAL: Soft, nontender, non-distended. No masses. Positive bowel sounds. No hepatosplenomegaly.  MUSCULOSKELETAL: No swelling, clubbing, or edema.  NEUROLOGIC: Mild distress due to acute illness SKIN:intact,warm,dry   PERTINENT DATA     Infusions:  heparin 1,350 Units/hr (06/15/21 0436)   norepinephrine (LEVOPHED) Adult infusion Stopped (06/15/21 0160)   Scheduled Medications:  furosemide  20 mg Intravenous Q12H   PRN Medications: docusate sodium, polyethylene glycol Hemodynamic parameters:   Intake/Output: 01/05 0701 - 01/06 0700 In: 1500 [IV Piggyback:1500] Out: 1150 [Urine:1150]  Ventilator  Settings:     LAB RESULTS:  Basic Metabolic Panel: Recent Labs  Lab 06/14/21 1557 06/15/21 0347  NA 131* 132*  K 4.5 4.8  CL 95* 98  CO2 30 25  GLUCOSE 107* 248*  BUN 28* 29*  CREATININE 1.33* 1.24  CALCIUM 8.9 8.5*  MG  --  1.4*  PHOS  --  4.4   Liver Function Tests: Recent Labs  Lab 06/14/21 1557  AST 20  ALT 20  ALKPHOS 93  BILITOT 2.1*  PROT 7.6  ALBUMIN 3.7   No results for input(s): LIPASE, AMYLASE in the last 168 hours. No results for input(s): AMMONIA in the last 168 hours. CBC: Recent Labs  Lab 06/14/21 1557 06/15/21 0347  WBC 5.6 4.3  NEUTROABS  4.1  --   HGB 10.3* 10.2*  HCT 31.4* 31.2*  MCV 82.8 82.3  PLT 173 183   Cardiac Enzymes: No results for input(s): CKTOTAL, CKMB, CKMBINDEX, TROPONINI in the last 168 hours. BNP: Invalid input(s): POCBNP CBG: No results for input(s): GLUCAP in the last 168 hours.     IMAGING RESULTS:  Imaging: DG Chest 2 View  Result Date: 06/14/2021 CLINICAL DATA:  COPD exacerbation. EXAM: CHEST - 2 VIEW COMPARISON:  05/27/2021 FINDINGS: Chronic cardiomegaly, unchanged. Unchanged mediastinal contours with aortic  atherosclerosis. Similar hyperinflation and peribronchial thickening. No focal airspace disease, pleural effusion, or pneumothorax. Minor thoracic spondylosis. Chronic widening of the right acromioclavicular joint. IMPRESSION: 1. No acute abnormality. 2. Stable cardiomegaly. 3. Chronic hyperinflation and peribronchial thickening consistent with provided history of COPD. Electronically Signed   By: Keith Rake M.D.   On: 06/14/2021 19:29   DG Tibia/Fibula Left  Result Date: 06/14/2021 CLINICAL DATA:  Diabetic wound infection. EXAM: LEFT TIBIA AND FIBULA - 2 VIEW COMPARISON:  None. FINDINGS: There is no evidence of fracture or other focal bone lesions. Vascular calcifications are noted. IMPRESSION: Normal left tibia and fibula. Electronically Signed   By: Marijo Conception M.D.   On: 06/14/2021 16:49   DG Foot Complete Left  Result Date: 06/14/2021 CLINICAL DATA:  Left foot wound. EXAM: LEFT FOOT - COMPLETE 3+ VIEW COMPARISON:  None. FINDINGS: Status post amputation of second toe. No fracture or dislocation is noted. Joint spaces are intact. No lytic destruction is seen to suggest osteomyelitis. Soft tissues are unremarkable. IMPRESSION: No acute abnormality seen. Electronically Signed   By: Marijo Conception M.D.   On: 06/14/2021 16:51   @PROBHOSP @ DG Chest 2 View  Result Date: 06/14/2021 CLINICAL DATA:  COPD exacerbation. EXAM: CHEST - 2 VIEW COMPARISON:  05/27/2021 FINDINGS: Chronic cardiomegaly, unchanged. Unchanged mediastinal contours with aortic atherosclerosis. Similar hyperinflation and peribronchial thickening. No focal airspace disease, pleural effusion, or pneumothorax. Minor thoracic spondylosis. Chronic widening of the right acromioclavicular joint. IMPRESSION: 1. No acute abnormality. 2. Stable cardiomegaly. 3. Chronic hyperinflation and peribronchial thickening consistent with provided history of COPD. Electronically Signed   By: Keith Rake M.D.   On: 06/14/2021 19:29   DG  Tibia/Fibula Left  Result Date: 06/14/2021 CLINICAL DATA:  Diabetic wound infection. EXAM: LEFT TIBIA AND FIBULA - 2 VIEW COMPARISON:  None. FINDINGS: There is no evidence of fracture or other focal bone lesions. Vascular calcifications are noted. IMPRESSION: Normal left tibia and fibula. Electronically Signed   By: Marijo Conception M.D.   On: 06/14/2021 16:49   DG Foot Complete Left  Result Date: 06/14/2021 CLINICAL DATA:  Left foot wound. EXAM: LEFT FOOT - COMPLETE 3+ VIEW COMPARISON:  None. FINDINGS: Status post amputation of second toe. No fracture or dislocation is noted. Joint spaces are intact. No lytic destruction is seen to suggest osteomyelitis. Soft tissues are unremarkable. IMPRESSION: No acute abnormality seen. Electronically Signed   By: Marijo Conception M.D.   On: 06/14/2021 16:51     ASSESSMENT AND PLAN    -Multidisciplinary rounds held today  Acute Hypoxic Respiratory Failure -continue Full MV support -continue Bronchodilator Therapy -Wean Fio2 and PEEP as tolerated -will perform SAT/SBT when respiratory parameters are met   Circulatory shock - present on admission     Suspect cardiogenic vs distributive -oxygen as needed -Lasix as tolerated -follow up cardiac enzymes as indicated ICU monitoring  Sonographer Comments: Suboptimal apical window.  IMPRESSIONS    1. Left  ventricular ejection fraction, by estimation, is <20%. The left  ventricle has severely decreased function. The left ventricle demonstrates  global hypokinesis. The left ventricular internal cavity size was severely  dilated. Left ventricular  diastolic parameters are indeterminate.   2. Right ventricular systolic function is severely reduced. The right  ventricular size is moderately enlarged. There is severely elevated  pulmonary artery systolic pressure. The estimated right ventricular  systolic pressure is 59.7 mmHg.   3. The mitral valve is normal in structure. No evidence of mitral valve   regurgitation. No evidence of mitral stenosis.   4. The aortic valve is normal in structure. Aortic valve regurgitation is  not visualized. Aortic valve sclerosis/calcification is present, without  any evidence of aortic stenosis.   5. The inferior vena cava is dilated in size with <50% respiratory  variability, suggesting right atrial pressure of 15 mmHg.    Renal Failure-most likely diabetic nephropathy -follow chem 7 -follow UO -continue Foley Catheter-assess need daily -improved overnight    ID -continue IV abx as prescibed -follow up cultures   GI/Nutrition GI PROPHYLAXIS as indicated DIET-->TF's as tolerated Constipation protocol as indicated  ENDO - ICU hypoglycemic\Hyperglycemia protocol -check FSBS per protocol   ELECTROLYTES -follow labs as needed -replace as needed -pharmacy consultation   DVT/GI PRX ordered -SCDs  TRANSFUSIONS AS NEEDED MONITOR FSBS ASSESS the need for LABS as needed Critical care provider statement:     Total critical care time: 33 minutes   Performed by: Lanney Gins MD   Critical care time was exclusive of separately billable procedures and treating other patients.   Critical care was necessary to treat or prevent imminent or life-threatening deterioration.   Critical care was time spent personally by me on the following activities: development of treatment plan with patient and/or surrogate as well as nursing, discussions with consultants, evaluation of patient's response to treatment, examination of patient, obtaining history from patient or surrogate, ordering and performing treatments and interventions, ordering and review of laboratory studies, ordering and review of radiographic studies, pulse oximetry and re-evaluation of patient's condition.    Ottie Glazier, M.D.  Pulmonary & Critical Care Medicine        Ottie Glazier, M.D.  [facility]

## 2021-06-15 NOTE — Progress Notes (Signed)
*  PRELIMINARY RESULTS* Echocardiogram 2D Echocardiogram has been performed.  [name] [name] 06/15/2021, 12:40 PM

## 2021-06-15 NOTE — ED Notes (Signed)
Lab called for blood specimen collection

## 2021-06-15 NOTE — Consult Note (Signed)
ANTICOAGULATION CONSULT NOTE   Pharmacy Consult for Heparin Indication: chest pain/ACS  Allergies  Allergen Reactions   Dulaglutide Anaphylaxis, Diarrhea and Hives   Ace Inhibitors     hypotension   Other Itching    Skin itching associated with nitro patch   Prednisone Rash    Patient Measurements: Height: 5\' 7"  (170.2 cm) Weight: 84.4 kg (186 lb) IBW/kg (Calculated) : 66.1 Heparin Dosing Weight: 83.1 kg  Vital Signs: BP: 102/82 (01/06 1030) Pulse Rate: 74 (01/06 1030)  Labs: Recent Labs    06/14/21 1557 06/14/21 1908 06/14/21 2015 06/14/21 2052 06/14/21 2135 06/15/21 0347 06/15/21 1030  HGB 10.3*  --   --   --   --  10.2*  --   HCT 31.4*  --   --   --   --  31.2*  --   PLT 173  --   --   --   --  183  --   APTT  --   --  34  --  170* 53*  --   LABPROT  --   --  14.9  --  16.5*  --   --   INR  --   --  1.2  --  1.3*  --   --   HEPARINUNFRC  --   --   --   --  0.91* 0.12* 0.19*  CREATININE 1.33*  --   --   --   --  1.24  --   TROPONINIHS  --  341*  --  352*  --   --   --      Estimated Creatinine Clearance: 67.4 mL/min (by C-G formula based on SCr of 1.24 mg/dL).   Medical History: Past Medical History:  Diagnosis Date   Acute on chronic heart failure ([place])    AKI (acute kidney injury) ([place]) 01/03/2018   Angina at rest [facility]) 08/13/2017   Arthritis    Asthma    Atherosclerosis    BPH (benign prostatic hyperplasia)    Carotid arterial disease ([place])    Cellulitis of foot, left 09/27/2019   Charcot's joint of foot, right    Chronic combined systolic (congestive) and diastolic (congestive) heart failure ([name])    a. 02/2013 EF 50% by LV gram; b. 04/2017 Echo: EF 25-30%. diff HK. Gr2 DD; c. 06/2020 Echo: EF <20%, glob HK. RVS 53.51mmHg. Sev BAE. Mild to mod TR.   Chronic foot pain (Secondary Area of Pain) (Right) 01/29/2018   COPD (chronic obstructive pulmonary disease) (HCC)    COPD with acute exacerbation ([place]) 01/30/2020   Coronary artery disease    a.  2013 S/P PCI of LAD [facility]);  b. 03/2013 PCI: RCA 90p ( 2.5 x 23 mm DES); c. 02/2014 Cath: patent RCA stent->Med Rx; d. 04/2017 Cath: LM nl, LAd 30ost/p, 5m/d, D1 80ost, LCX 90p/m, OM1 85, RCA 70/20p, 62m, 70d->Referred for CT Surg-felt to be poor candidate; d. 01/2020 Cath: LM nl, LAD 20p/m, 60d. D1 80. LCX 90p/m. LPAV 95, RCA 80/20p, 80p/m.   Diabetic neuropathy ([place])    Diabetic ulcer of right foot ([place])    Hernia    HFrEF (heart failure with reduced ejection fraction) ([place]) 01/30/2020   Hyperlipidemia    Hypertension    Ischemic cardiomyopathy    a. 02/2013 EF 50% by LV gram; b. 04/2017 Echo: EF 25-30%. diff HK. Gr2 DD; c. 06/2020 Echo: EF <20%, glob HK. RVS 53.54mmHg. Sev BAE. Mild to mod TR.   Morbid obesity ([place])  Neuropathy    PAD (peripheral artery disease) ([place])    a. Followed by Dr. Lucky Cowboy; b. 08/2010 Periph Angio: RSFA 70-80p (6X60 self-expanding stent); c. 09/2010 Periph Angio: L SFA 100p (7x unknown length self-expanding stent); d. 06/2015 Periph Angio: R SFA short segment occlusion (6x12 self-expanding stent).   Persistent atrial fibrillation (HCC)    Restless leg syndrome    Sepsis ([place]) 03/30/2018   Sleep apnea    Subclavian artery stenosis, right (HCC)    Syncope and collapse    Tobacco use    a. 75+ yr hx - still smoking 1ppd, down from 2 ppd.   Type II diabetes mellitus ([place])    Unstable angina ([place]) 07/11/2016   Varicose vein     Medications:  (Not in a hospital admission) Scheduled:   furosemide  20 mg Intravenous Q12H   Infusions:   heparin 1,350 Units/hr (06/15/21 0436)   norepinephrine (LEVOPHED) Adult infusion Stopped (06/15/21 6283)   PRN:  Anti-infectives (From admission, onward)    Start     Dose/Rate Route Frequency Ordered Stop   06/14/21 2045  cefTRIAXone (ROCEPHIN) 1 g in sodium chloride 0.9 % 100 mL IVPB        1 g 200 mL/hr over 30 Minutes Intravenous  Once 06/14/21 2035 06/14/21 2111   06/14/21 1615  vancomycin (VANCOREADY) IVPB 2000 mg/400 mL         2,000 mg 200 mL/hr over 120 Minutes Intravenous  Once 06/14/21 1607 06/14/21 1852       Assessment: Pharmacy consulted for heparin for ACS. Pt is on apixaban for afib. Will use aPTT for monitoring and order heparin level for baseline.    1/5 2132 BASELINE aPTT 170 sec, HL 0.91 -- drawn shortly after initiation of heparin-- suspect error in collection 1/6 0347 aPTT 53 sec, HL 0.12 SUBtherapeutic 1/6 1030 HL 0.19  SUBtherapeutic  Goal of Therapy:  Heparin level 0.3-0.7 units/ml aPTT 65-102 seconds.  Monitor platelets by anticoagulation protocol: Yes   Plan:  Heparin subtherapeutic  Give 2500 units bolus x 1 Increase heparin infusion to 1600 units/hr Check anti-Xa level in 6 hours following rate change Continue to monitor H&H and platelets  Paulina Fusi, PharmD, BCPS 06/15/2021 11:27 AM

## 2021-06-15 NOTE — Consult Note (Signed)
ANTICOAGULATION CONSULT NOTE   Pharmacy Consult for Heparin Indication: chest pain/ACS  Allergies  Allergen Reactions   Dulaglutide Anaphylaxis, Diarrhea and Hives   Ace Inhibitors     hypotension   Other Itching    Skin itching associated with nitro patch   Prednisone Rash    Patient Measurements: Height: 5\' 7"  (170.2 cm) Weight: 84.4 kg (186 lb) IBW/kg (Calculated) : 66.1 Heparin Dosing Weight: 83.1 kg  Vital Signs: Temp: 98 F (36.7 C) (01/06 1925) Temp Source: Oral (01/06 1925) BP: 116/83 (01/06 1925) Pulse Rate: 90 (01/06 1925)  Labs: Recent Labs    06/14/21 1557 06/14/21 1908 06/14/21 2015 06/14/21 2052 06/14/21 2135 06/14/21 2135 06/15/21 0347 06/15/21 1030 06/15/21 1044 06/15/21 1928  HGB 10.3*  --   --   --   --   --  10.2*  --   --   --   HCT 31.4*  --   --   --   --   --  31.2*  --   --   --   PLT 173  --   --   --   --   --  183  --   --   --   APTT  --   --  34  --  170*  --  53*  --   --   --   LABPROT  --   --  14.9  --  16.5*  --   --   --   --   --   INR  --   --  1.2  --  1.3*  --   --   --   --   --   HEPARINUNFRC  --   --   --   --  0.91*   < > 0.12* 0.19*  --  0.30  CREATININE 1.33*  --   --   --   --   --  1.24  --   --   --   TROPONINIHS  --  341*  --  352*  --   --   --   --  182*  --    < > = values in this interval not displayed.     Estimated Creatinine Clearance: 67.4 mL/min (by C-G formula based on SCr of 1.24 mg/dL).   Medical History: Past Medical History:  Diagnosis Date   Acute on chronic heart failure ([place])    AKI (acute kidney injury) ([place]) 01/03/2018   Angina at rest [facility]) 08/13/2017   Arthritis    Asthma    Atherosclerosis    BPH (benign prostatic hyperplasia)    Carotid arterial disease ([place])    Cellulitis of foot, left 09/27/2019   Charcot's joint of foot, right    Chronic combined systolic (congestive) and diastolic (congestive) heart failure ([place])    a. 02/2013 EF 50% by LV gram; b. 04/2017 Echo: EF 25-30%.  diff HK. Gr2 DD; c. 06/2020 Echo: EF <20%, glob HK. RVS 53.19mmHg. Sev BAE. Mild to mod TR.   Chronic foot pain (Secondary Area of Pain) (Right) 01/29/2018   COPD (chronic obstructive pulmonary disease) (HCC)    COPD with acute exacerbation ([place]) 01/30/2020   Coronary artery disease    a. 2013 S/P PCI of LAD [facility]);  b. 03/2013 PCI: RCA 90p ( 2.5 x 23 mm DES); c. 02/2014 Cath: patent RCA stent->Med Rx; d. 04/2017 Cath: LM nl, LAd 30ost/p, 100m/d, D1 80ost, LCX 90p/m, OM1 85, RCA 70/20p, 80m,  70d->Referred for CT Surg-felt to be poor candidate; d. 01/2020 Cath: LM nl, LAD 20p/m, 60d. D1 80. LCX 90p/m. LPAV 95, RCA 80/20p, 80p/m.   Diabetic neuropathy ([place])    Diabetic ulcer of right foot ([place])    Hernia    HFrEF (heart failure with reduced ejection fraction) ([place]) 01/30/2020   Hyperlipidemia    Hypertension    Ischemic cardiomyopathy    a. 02/2013 EF 50% by LV gram; b. 04/2017 Echo: EF 25-30%. diff HK. Gr2 DD; c. 06/2020 Echo: EF <20%, glob HK. RVS 53.23mmHg. Sev BAE. Mild to mod TR.   Morbid obesity ([place])    Neuropathy    PAD (peripheral artery disease) ([place])    a. Followed by Dr. Lucky Cowboy; b. 08/2010 Periph Angio: RSFA 70-80p (6X60 self-expanding stent); c. 09/2010 Periph Angio: L SFA 100p (7x unknown length self-expanding stent); d. 06/2015 Periph Angio: R SFA short segment occlusion (6x12 self-expanding stent).   Persistent atrial fibrillation (HCC)    Restless leg syndrome    Sepsis ([place]) 03/30/2018   Sleep apnea    Subclavian artery stenosis, right (HCC)    Syncope and collapse    Tobacco use    a. 75+ yr hx - still smoking 1ppd, down from 2 ppd.   Type II diabetes mellitus (HCC)    Unstable angina ([place]) 07/11/2016   Varicose vein     Medications:  (Not in a hospital admission) Scheduled:   furosemide  20 mg Intravenous Q12H   Infusions:   heparin 1,600 Units/hr (06/15/21 1421)   PRN:  Anti-infectives (From admission, onward)    Start     Dose/Rate Route Frequency Ordered Stop    06/14/21 2045  cefTRIAXone (ROCEPHIN) 1 g in sodium chloride 0.9 % 100 mL IVPB        1 g 200 mL/hr over 30 Minutes Intravenous  Once 06/14/21 2035 06/14/21 2111   06/14/21 1615  vancomycin (VANCOREADY) IVPB 2000 mg/400 mL        2,000 mg 200 mL/hr over 120 Minutes Intravenous  Once 06/14/21 1607 06/14/21 1852       Assessment: Pharmacy consulted for heparin for ACS. Pt is on apixaban for afib. Will use aPTT for monitoring and order heparin level for baseline.    1/5 2132 BASELINE aPTT 170 sec, HL 0.91 -- drawn shortly after initiation of heparin-- suspect error in collection 1/6 0347 aPTT 53 sec, HL 0.12 SUBtherapeutic 1/6 1030 HL 0.19  SUBtherapeutic   inc to 1600 u/hr 1/6 1928 HL=0.30  therap, barely  will increase to 1650 u/hr  Goal of Therapy:  Heparin level 0.3-0.7 units/ml aPTT 65-102 seconds.  Monitor platelets by anticoagulation protocol: Yes   Plan:  1/6 1928 HL=0.30  therapeutic, barely.   will slightly increase to 1650 units/hr reCheck anti-Xa level in 6 hours  Continue to monitor H&H and platelets  Chinita Greenland PharmD Clinical Pharmacist 06/15/2021

## 2021-06-15 NOTE — Evaluation (Signed)
Occupational Therapy Evaluation Patient Details Name: [name] [name] MRN: [id number] DOB: 05/13/1961 Today's Date: 06/15/2021   History of Present Illness 61 y.o. male with a hx of CAD s/p prior PCI, chronic combined systolic and diastolic congestive heart failure, ICM, pulmonary hypertension, tobacco abuse, COPD, HTN, HLD, permanent atrial fibrillation, obesity, obstructive sleep apnea, type 2 diabetes mellitus, stage III chronic kidney disease, peripheral arterial disease status post right BKA, and carotid arterial disease, who is being seen today for the evaluation of SOB and chest tightness at the request of Dr. Lanney Gins   Clinical Impression   Upon entering the room, pt supine in bed and agreeable to OT intervention. Pt reports living at home with wife and that they "help each other" with ADLs and IADLs. Pt does not ambulate at baseline per his report. He does don R LE prosthetic without assistance and is able to demonstrate transfer from ED stretcher to chair without physical assistance with squat pivot. Pt reports transferring into power or manual wheelchair at home and this is how he gets around. He even reports sleeping in power wheelchair at times. Pt agrees that he is at his baseline for functional tasks at this time. OT to SIGN OFF at this time.      Recommendations for follow up therapy are one component of a multi-disciplinary discharge planning process, led by the attending physician.  Recommendations may be updated based on patient status, additional functional criteria and insurance authorization.   Follow Up Recommendations  No OT follow up    Assistance Recommended at Discharge Set up Supervision/Assistance  Patient can return home with the following A little help with walking and/or transfers;A little help with bathing/dressing/bathroom    Functional Status Assessment  Patient has not had a recent decline in their functional status  Equipment Recommendations  None  recommended by OT       Precautions / Restrictions Precautions Precautions: Fall      Mobility Bed Mobility Overal bed mobility: Modified Independent             General bed mobility comments: no physical assistance provided from ED stretcher    Transfers Overall transfer level: Modified independent Equipment used: None               General transfer comment: Pt dons R LE prosthesis and pivot transfers into seat without assistance          ADL either performed or assessed with clinical judgement   ADL Overall ADL's : At baseline                                             Vision Patient Visual Report: No change from baseline              Pertinent Vitals/Pain Pain Assessment: No/denies pain     Hand Dominance Right   Extremity/Trunk Assessment Upper Extremity Assessment Upper Extremity Assessment: Overall WFL for tasks assessed   Lower Extremity Assessment Lower Extremity Assessment: RLE deficits/detail;LLE deficits/detail RLE Deficits / Details: prosthesis LLE Deficits / Details: edema noted with multiple sores/open wounds on LE and foot.       Communication Communication Communication: No difficulties   Cognition Arousal/Alertness: Awake/alert Behavior During Therapy: WFL for tasks assessed/performed Overall Cognitive Status: Within Functional Limits for tasks assessed  Home Living Family/patient expects to be discharged to:: Private residence Living Arrangements: Spouse/significant other Available Help at Discharge: Family;Available 24 hours/day Type of Home: House Home Access: Ramped entrance     Home Layout: One level     Bathroom Shower/Tub: Occupational psychologist: Standard     Home Equipment: Conservation officer, nature (2 wheels);Cane - single point;Wheelchair - manual;Wheelchair - power;BSC/3in1;Crutches          Prior  Functioning/Environment Prior Level of Function : Needs assist             Mobility Comments: Pt reports transferring into manual or power wheelchair during the day. He does not ambulate at baseline. ADLs Comments: Pt reports he helps wife and she helps him with self care tasks and they do IADL tasks together but they have become difficult.                 OT Goals(Current goals can be found in the care plan section) Acute Rehab OT Goals Patient Stated Goal: to return home OT Goal Formulation: With patient Time For Goal Achievement: 06/15/21 Potential to Achieve Goals: Good  OT Frequency:         AM-PAC OT "6 Clicks" Daily Activity     Outcome Measure Help from another person eating meals?: None Help from another person taking care of personal grooming?: None Help from another person toileting, which includes using toliet, bedpan, or urinal?: None Help from another person bathing (including washing, rinsing, drying)?: None Help from another person to put on and taking off regular upper body clothing?: None Help from another person to put on and taking off regular lower body clothing?: None 6 Click Score: 24   End of Session Nurse Communication: Other (comment) (R forearm IV bleeding)  Activity Tolerance: Patient tolerated treatment well Patient left: in bed;with call bell/phone within reach                   Time: 0950-1005 OT Time Calculation (min): 15 min Charges:  OT General Charges $OT Visit: 1 Visit OT Evaluation $OT Eval Low Complexity: 1 Low OT Treatments $Self Care/Home Management : 8-22 mins  Darleen Crocker, MS, OTR/L , CBIS ascom [phone]  06/15/21, 12:11 PM

## 2021-06-15 NOTE — ED Notes (Signed)
Requested Dr [name] [name] to place PRN IV morphine order.

## 2021-06-15 NOTE — ED Notes (Signed)
Cardiology PA at bedside.  Bedside echo being performed.

## 2021-06-16 DIAGNOSIS — R578 Other shock: Secondary | ICD-10-CM

## 2021-06-16 DIAGNOSIS — I5043 Acute on chronic combined systolic (congestive) and diastolic (congestive) heart failure: Secondary | ICD-10-CM

## 2021-06-16 LAB — BASIC METABOLIC PANEL
Anion gap: 7 (ref 5–15)
BUN: 32 mg/dL — ABNORMAL HIGH (ref 6–20)
CO2: 27 mmol/L (ref 22–32)
Calcium: 8.8 mg/dL — ABNORMAL LOW (ref 8.9–10.3)
Chloride: 93 mmol/L — ABNORMAL LOW (ref 98–111)
Creatinine, Ser: 1.26 mg/dL — ABNORMAL HIGH (ref 0.61–1.24)
GFR, Estimated: >60 mL/min (ref >60)
Glucose, Bld: 138 mg/dL — ABNORMAL HIGH (ref 70–99)
Potassium: 4.8 mmol/L (ref 3.5–5.1)
Sodium: 127 mmol/L — ABNORMAL LOW (ref 135–145)

## 2021-06-16 LAB — CBC
HCT: 31 % — ABNORMAL LOW (ref 39.0–52.0)
Hemoglobin: 10.1 g/dL — ABNORMAL LOW (ref 13.0–17.0)
MCH: 26.8 pg (ref 26.0–34.0)
MCHC: 32.6 g/dL (ref 30.0–36.0)
MCV: 82.2 fL (ref 80.0–100.0)
Platelets: 163 10*3/uL (ref 150–400)
RBC: 3.77 MIL/uL — ABNORMAL LOW (ref 4.22–5.81)
RDW: 21.9 % — ABNORMAL HIGH (ref 11.5–15.5)
WBC: 7.2 10*3/uL (ref 4.0–10.5)
nRBC: 0 % (ref 0.0–0.2)

## 2021-06-16 LAB — SODIUM
Sodium: 126 mmol/L — ABNORMAL LOW (ref 135–145)
Sodium: 129 mmol/L — ABNORMAL LOW (ref 135–145)

## 2021-06-16 LAB — HEPARIN LEVEL (UNFRACTIONATED): Heparin Unfractionated: 0.33 IU/mL (ref 0.30–0.70)

## 2021-06-16 LAB — MAGNESIUM: Magnesium: 2.1 mg/dL (ref 1.7–2.4)

## 2021-06-16 LAB — PROCALCITONIN: Procalcitonin: <0.1 ng/mL

## 2021-06-16 LAB — PHOSPHORUS: Phosphorus: 2.7 mg/dL (ref 2.5–4.6)

## 2021-06-16 MED ORDER — MORPHINE SULFATE (PF) 2 MG/ML IV SOLN
2.0000 mg | INTRAVENOUS | Status: DC | PRN
  Administered 2021-06-16 – 2021-06-17 (×6): 2 mg via INTRAVENOUS
  Filled 2021-06-16 (×6): qty 1

## 2021-06-16 MED ORDER — DULOXETINE HCL 30 MG PO CPEP
60.0000 mg | ORAL_CAPSULE | Freq: Every day | ORAL | Status: DC
  Administered 2021-06-16 – 2021-06-19 (×4): 60 mg via ORAL
  Filled 2021-06-16 (×3): qty 1
  Filled 2021-06-16: qty 2

## 2021-06-16 MED ORDER — GABAPENTIN 300 MG PO CAPS: 800.0000 mg | ORAL_CAPSULE | Freq: Four times a day (QID) | ORAL | Status: DC

## 2021-06-16 MED ORDER — DOCUSATE SODIUM 100 MG PO CAPS: 100.0000 mg | ORAL_CAPSULE | Freq: Two times a day (BID) | ORAL | Status: DC | PRN

## 2021-06-16 MED ORDER — GABAPENTIN 400 MG PO CAPS
800.0000 mg | ORAL_CAPSULE | Freq: Four times a day (QID) | ORAL | Status: DC
  Administered 2021-06-16 – 2021-06-19 (×12): 800 mg via ORAL
  Filled 2021-06-16 (×12): qty 2

## 2021-06-16 MED ORDER — ASPIRIN 300 MG RE SUPP: 300.0000 mg | RECTAL | Status: AC

## 2021-06-16 MED ORDER — SODIUM CHLORIDE 1 G PO TABS
1.0000 g | ORAL_TABLET | Freq: Three times a day (TID) | ORAL | Status: AC
Start: 2021-06-16 — End: 2021-06-18
  Administered 2021-06-16 – 2021-06-18 (×6): 1 g via ORAL
  Filled 2021-06-16 (×6): qty 1

## 2021-06-16 MED ORDER — ASPIRIN 81 MG PO CHEW
324.0000 mg | CHEWABLE_TABLET | ORAL | Status: AC
  Administered 2021-06-16: 324 mg via ORAL
  Filled 2021-06-16: qty 4

## 2021-06-16 MED ORDER — POLYETHYLENE GLYCOL 3350 17 G PO PACK: 17.0000 g | PACK | Freq: Every day | ORAL | Status: DC | PRN

## 2021-06-16 MED ORDER — APIXABAN 5 MG PO TABS
5.0000 mg | ORAL_TABLET | Freq: Two times a day (BID) | ORAL | Status: DC
  Administered 2021-06-16 – 2021-06-19 (×7): 5 mg via ORAL
  Filled 2021-06-16 (×7): qty 1

## 2021-06-16 NOTE — Progress Notes (Signed)
Requested patient placement pull patient back, as needed the bed for an ICU patient so they could take an ICU pt from the ED. Patient placement made the change. Spoke with ICU CN, and ED Flow coordinator.

## 2021-06-16 NOTE — TOC Initial Note (Signed)
Transition of Care [facility]) - Initial/Assessment Note    Patient Details  Name: [name] [name] MRN: [id number] Date of Birth: Mar 31, 1961  [facility]) CM/SW Contact:    Anselm Pancoast, RN Phone Number: 06/16/2021, 3:19 PM  Clinical Narrative:                 Spoke with patient at bedside. Patient reports he lives in his home with his girlfriend. Drives and has no issues with transportation or obtaining his medications. Patient has a prosthetic right leg that he has no issues with currently. States his diabetes is well controlled. Anticipates returning home when medically able.   Expected Discharge Plan: Home/Self Care Barriers to Discharge: No Barriers Identified   Patient Goals and CMS Choice Patient states their goals for this hospitalization and ongoing recovery are:: Return home      Expected Discharge Plan and Services Expected Discharge Plan: Home/Self Care       Living arrangements for the past 2 months: Single Family Home                                      Prior Living Arrangements/Services Living arrangements for the past 2 months: Single Family Home Lives with:: Significant Other Patient language and need for interpreter reviewed:: Yes Do you feel safe going back to the place where you live?: Yes      Need for Family Participation in Patient Care: Yes (Comment) Care giver support system in place?: Yes (comment)   Criminal Activity/Legal Involvement Pertinent to Current Situation/Hospitalization: No - Comment as needed  Activities of Daily Living      Permission Sought/Granted                  Emotional Assessment Appearance:: Appears stated age Attitude/Demeanor/Rapport: Engaged Affect (typically observed): Accepting Orientation: : Oriented to Self, Oriented to Place, Oriented to  Time, Oriented to Situation Alcohol / Substance Use: Not Applicable Psych Involvement: No (comment)  Admission diagnosis:  Acute on chronic combined  systolic and diastolic CHF (congestive heart failure) (HCC) [I50.43] Patient Active Problem List   Diagnosis Date Noted   Acute on chronic combined systolic and diastolic CHF (congestive heart failure) ([place]) 06/14/2021   Permanent atrial fibrillation (HCC)    Acute on chronic HFrEF (heart failure with reduced ejection fraction) ([place]) 03/26/2021   Cardiogenic shock ([place]) 03/26/2021   Thrombocytopenia ([place]) 03/21/2021   Cellulitis of left lower extremity 03/18/2021   Chronic ulcer of left leg ([place]) 03/18/2021   Hypotension 03/18/2021   BPH (benign prostatic hyperplasia)    Depression    Stage 3a chronic kidney disease (CKD) (HCC)    Elevated troponin    Acquired absence of right leg below knee ([place]) 08/22/2020   Pyogenic inflammation of bone (HCC)    Chronic a-fib ([place])    Demand ischemia ([place])    Palliative care by specialist    CHF (congestive heart failure) ([place]) 06/18/2020   Chronic anticoagulation 06/17/2020   Chronic venous insufficiency 02/10/2020   COPD with acute exacerbation ([place]) 01/30/2020   Persistent atrial fibrillation ([place]) 01/19/2020   Chronic bilateral low back pain with sciatica 10/08/2019   Medication management contract signed 10/08/2019   Hyponatremia 09/27/2019   CAD (coronary artery disease) 08/31/2019   Acute on chronic systolic CHF (congestive heart failure) ([place]) 11/05/2018   Open toe wound 10/01/2018   Venous ulcer of left leg ([place])  08/19/2018   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[place]) 04/28/2018   Abnormal MRI, shoulder (Right) 02/23/2018   Abnormal MRI, cervical spine (2016) 02/23/2018   DDD (degenerative disc disease), cervical 02/23/2018   Cervical foraminal stenosis (Bilateral) 02/23/2018   Cervical central spinal stenosis 02/23/2018   Cervical facet hypertrophy 02/23/2018   Arthralgia of acromioclavicular joint (Right) 02/23/2018   Osteoarthritis of  AC (acromioclavicular) joint (Right) 02/23/2018   Biceps tendinosis of shoulder  (Right) 02/23/2018   Tendinopathy of rotator cuff (Right) 02/23/2018   Vitamin D deficiency 02/23/2018   Atherosclerosis of native arteries of the extremities with ulceration ([place]) 02/15/2018   Peripheral edema 02/05/2018   Status post peripherally inserted central catheter (PICC) central line placement 02/04/2018   Chronic lower extremity pain (Primary Area of Pain) (Right) 01/29/2018   Chronic shoulder pain (Tertiary Area of Pain) (Right) 01/29/2018   Chronic pain syndrome 01/29/2018   Long term current use of opiate analgesic 01/29/2018   Medication monitoring encounter 01/29/2018   Disorder of skeletal system 01/29/2018   Problems influencing health status 01/29/2018   AKI (acute kidney injury) ([place]) 01/03/2018   NSTEMI (non-ST elevated myocardial infarction) ([place]) 11/21/2017   HLD (hyperlipidemia) 09/10/2017   Angina pectoris ([place]) 09/08/2017   Lymphedema 34/19/6222   Chronic systolic CHF (congestive heart failure) ([place]) 04/23/2017   Chest pain 07/11/2016   Foot ulcer (Right)    Stable angina (HCC)    Pressure injury of skin 05/06/2016   Diabetic foot infection ([place]) 05/05/2016   Type 2 diabetes mellitus with hyperlipidemia ([place]) 01/26/2016   Cellulitis 11/21/2015   Morbid obesity ([place]) 11/17/2014   PAD (peripheral artery disease) ([place])    Hypertension    Tobacco use    COPD (chronic obstructive pulmonary disease) ([place]) 05/19/2012   Diabetic neuropathy ([place]) 05/19/2012   PVD (peripheral vascular disease) ([place]) 03/27/2012   PCP:  [name], MD Pharmacy:   [facility], [place] [address] Phone: [phone] Fax: [place] [facility] [place] [address] Phone: [phone] Fax: [phone]     Social Determinants of Health (SDOH) Interventions    Readmission Risk Interventions Readmission Risk Prevention Plan 03/20/2021 08/07/2020 06/30/2020   Transportation Screening Complete Complete Complete  HRI or [place] Work Consult for [place] Planning/Counseling - - -  SW consult not completed comments - - -  Palliative Care Screening - - -  Medication Review Press photographer) Complete Complete Complete  PCP or Specialist appointment within 3-5 days of discharge Complete Complete Complete  HRI or Home Care Consult Complete Complete Complete  SW Recovery Care/Counseling Consult Complete Complete Complete  Palliative Care Screening Not Applicable Not Applicable Not Applicable  Skilled Nursing Facility Not Applicable Not Applicable Not Applicable  Some recent data might be hidden

## 2021-06-16 NOTE — Consult Note (Signed)
PHARMACY CONSULT NOTE - FOLLOW UP  Pharmacy Consult for Electrolyte Monitoring and Replacement   Recent Labs: Potassium (mmol/L)  Date Value  06/16/2021 4.8  06/08/2014 4.3   Magnesium (mg/dL)  Date Value  06/16/2021 2.1  02/06/2014 1.4 (L)   Calcium (mg/dL)  Date Value  06/16/2021 8.8 (L)   Calcium, Total (mg/dL)  Date Value  06/08/2014 9.2   Albumin (g/dL)  Date Value  06/14/2021 3.7  01/29/2018 3.3 (L)  03/07/2014 3.9   Phosphorus (mg/dL)  Date Value  06/16/2021 2.7   Sodium (mmol/L)  Date Value  06/16/2021 127 (L)  04/17/2021 129 (L)  06/08/2014 136     Assessment: 61 y/o male presented 06/14/21 with complaints of diabetic wound infection and acute on chronic HF exacerbation. Pt with PMH of afib. Pharmacy to monitor and replace electrolytes as needed  Medications: --Lasix 20 mg IV Q12H  Goal of Therapy:  K: 4-5.1 Mag: 2-2.4 All other electrolytes WNL  Plan:  NO replacement required at this time.  Recheck electrolytes with AM labs  Oswald Hillock ,PharmD, BCPS Clinical Pharmacist 06/16/2021 7:42 AM

## 2021-06-16 NOTE — ED Notes (Addendum)
[name] [name] notified via secure chat that patient reports he is feeling worse with increased shortness of breath. Patient denies chest pain. Tripod position, 95-100% oxygen saturation on RA. Instructed to administer dose of morphine at this time.

## 2021-06-16 NOTE — Progress Notes (Signed)
CRITICAL CARE PROGRESS NOTE    Name: [name] [name] MRN: [id number] DOB: [birth year]     LOS: 2   SUBJECTIVE FINDINGS & SIGNIFICANT EVENTS    Patient description:  61 y.o. male with history of CAD, ischemic cardiomyopathy, COPD, hypertension, permanent atrial fibrillation, DM, CKD stage III, HLD, OSA, BPH, Acute on chronic systolic CHF (EF 10-25%),ENIDPOE abuse medication noncompliance, leaving AMA from the hospital who presented to the ED with chief complaints of increased pain redness and swelling of the LLE for the past 2 weeks.   Patient was recently admitted ON 03/30/2021 with cardiogenic shock secondary to acute on chronic CHF but left AMA. He was previously admitted with LLE cellulitis  but report that his leg continues to bother him with purulent discharge and increase erythema. He fell yesterday due to weakness and pain in his leg. Patient also report worsening productive cough with exertional chest discomfort and shortness of breath.  06/16/20- patient is off vasopressors and is no longer in shock physiology. He is on heparin gtt. He is eating and is in stable condition.     Lines/tubes :   Microbiology/Sepsis markers: Results for orders placed or performed during the hospital encounter of 06/14/21  Resp Panel by RT-PCR (Flu A&B, Covid) Nasopharyngeal Swab     Status: None   Collection Time: 06/14/21  7:07 PM   Specimen: Nasopharyngeal Swab; Nasopharyngeal(NP) swabs in vial transport medium  Result Value Ref Range Status   SARS Coronavirus 2 by RT PCR NEGATIVE NEGATIVE Final    Comment: (NOTE) SARS-CoV-2 target nucleic acids are NOT DETECTED.  The SARS-CoV-2 RNA is generally detectable in upper respiratory specimens during the acute phase of infection. The lowest concentration of SARS-CoV-2 viral  copies this assay can detect is 138 copies/mL. A negative result does not preclude SARS-Cov-2 infection and should not be used as the sole basis for treatment or other patient management decisions. A negative result may occur with  improper specimen collection/handling, submission of specimen other than nasopharyngeal swab, presence of viral mutation(s) within the areas targeted by this assay, and inadequate number of viral copies(<138 copies/mL). A negative result must be combined with clinical observations, patient history, and epidemiological information. The expected result is Negative.  Fact Sheet for Patients:  EntrepreneurPulse.com.au  Fact Sheet for Healthcare Providers:  IncredibleEmployment.be  This test is no t yet approved or cleared by the [place] FDA and  has been authorized for detection and/or diagnosis of SARS-CoV-2 by FDA under an Emergency Use Authorization (EUA). This EUA will remain  in effect (meaning this test can be used) for the duration of the COVID-19 declaration under Section 564(b)(1) of the Act, 21 U.S.C.section 360bbb-3(b)(1), unless the authorization is terminated  or revoked sooner.       Influenza A by PCR NEGATIVE NEGATIVE Final   Influenza B by PCR NEGATIVE NEGATIVE Final    Comment: (NOTE) The Xpert Xpress SARS-CoV-2/FLU/RSV plus assay is intended as an aid in the diagnosis of influenza from Nasopharyngeal swab specimens and should not be used as a sole basis for treatment. Nasal washings and aspirates are unacceptable for Xpert Xpress SARS-CoV-2/FLU/RSV testing.  Fact Sheet for Patients: EntrepreneurPulse.com.au  Fact Sheet for Healthcare Providers: IncredibleEmployment.be  This test is not yet approved or cleared by the [place] FDA and has been authorized for detection and/or diagnosis of SARS-CoV-2 by FDA under an Emergency Use Authorization (EUA). This  EUA will remain in effect (meaning this test can be used) for the duration  of the COVID-19 declaration under Section 564(b)(1) of the Act, 21 U.S.C. section 360bbb-3(b)(1), unless the authorization is terminated or revoked.  Performed at [facility], [address] [place], [place] [address]   Blood culture (single)     Status: None (Preliminary result)   Collection Time: 06/14/21  8:00 PM   Specimen: BLOOD  Result Value Ref Range Status   Specimen Description BLOOD BLOOD RIGHT HAND  Final   Special Requests   Final    BOTTLES DRAWN AEROBIC AND ANAEROBIC Blood Culture adequate volume   Culture   Final    NO GROWTH 2 DAYS Performed at [facility], [address], [place], [place] [address]    Report Status PENDING  Incomplete  Respiratory (~20 pathogens) panel by PCR     Status: None   Collection Time: 06/15/21  7:28 PM   Specimen: Nasopharyngeal Swab; Respiratory  Result Value Ref Range Status   Adenovirus NOT DETECTED NOT DETECTED Final   Coronavirus 229E NOT DETECTED NOT DETECTED Final    Comment: (NOTE) The Coronavirus on the Respiratory Panel, DOES NOT test for the novel  Coronavirus (2019 nCoV)    Coronavirus HKU1 NOT DETECTED NOT DETECTED Final   Coronavirus NL63 NOT DETECTED NOT DETECTED Final   Coronavirus OC43 NOT DETECTED NOT DETECTED Final   Metapneumovirus NOT DETECTED NOT DETECTED Final   Rhinovirus / Enterovirus NOT DETECTED NOT DETECTED Final   Influenza A NOT DETECTED NOT DETECTED Final   Influenza B NOT DETECTED NOT DETECTED Final   Parainfluenza Virus 1 NOT DETECTED NOT DETECTED Final   Parainfluenza Virus 2 NOT DETECTED NOT DETECTED Final   Parainfluenza Virus 3 NOT DETECTED NOT DETECTED Final   Parainfluenza Virus 4 NOT DETECTED NOT DETECTED Final   Respiratory Syncytial Virus NOT DETECTED NOT DETECTED Final   Bordetella pertussis NOT DETECTED NOT DETECTED Final   Bordetella Parapertussis NOT DETECTED NOT DETECTED Final    Chlamydophila pneumoniae NOT DETECTED NOT DETECTED Final   Mycoplasma pneumoniae NOT DETECTED NOT DETECTED Final    Comment: Performed at [facility], [place] [address], [place],  [address]    Anti-infectives:  Anti-infectives (From admission, onward)    Start     Dose/Rate Route Frequency Ordered Stop   06/14/21 2045  cefTRIAXone (ROCEPHIN) 1 g in sodium chloride 0.9 % 100 mL IVPB        1 g 200 mL/hr over 30 Minutes Intravenous  Once 06/14/21 2035 06/14/21 2111   06/14/21 1615  vancomycin (VANCOREADY) IVPB 2000 mg/400 mL        2,000 mg 200 mL/hr over 120 Minutes Intravenous  Once 06/14/21 1607 06/14/21 1852        Consults: Treatment Team:  Pccm, Ander Gaster, MD Wellington Hampshire, MD       PAST MEDICAL HISTORY   Past Medical History:  Diagnosis Date   Acute on chronic heart failure ([place])    AKI (acute kidney injury) ([place]) 01/03/2018   Angina at rest [facility]) 08/13/2017   Arthritis    Asthma    Atherosclerosis    BPH (benign prostatic hyperplasia)    Carotid arterial disease ([place])    Cellulitis of foot, left 09/27/2019   Charcot's joint of foot, right    Chronic combined systolic (congestive) and diastolic (congestive) heart failure ([place])    a. 02/2013 EF 50% by LV gram; b. 04/2017 Echo: EF 25-30%. diff HK. Gr2 DD; c. 06/2020 Echo: EF <20%, glob HK. RVS 53.32mmHg. Sev BAE.  Mild to mod TR.   Chronic foot pain (Secondary Area of Pain) (Right) 01/29/2018   COPD (chronic obstructive pulmonary disease) (HCC)    COPD with acute exacerbation ([place]) 01/30/2020   Coronary artery disease    a. 2013 S/P PCI of LAD [facility]);  b. 03/2013 PCI: RCA 90p ( 2.5 x 23 mm DES); c. 02/2014 Cath: patent RCA stent->Med Rx; d. 04/2017 Cath: LM nl, LAd 30ost/p, 48md, D1 80ost, LCX 90p/m, OM1 85, RCA 70/20p, 850m70d->Referred for CT Surg-felt to be poor candidate; d. 01/2020 Cath: LM nl, LAD 20p/m, 60d. D1 80. LCX 90p/m. LPAV 95, RCA 80/20p, 80p/m.   Diabetic neuropathy ([facility]   Diabetic  ulcer of right foot ([facility]   Hernia    HFrEF (heart failure with reduced ejection fraction) ([place]08/22/2021   Hyperlipidemia    Hypertension    Ischemic cardiomyopathy    a. 02/2013 EF 50% by LV gram; b. 04/2017 Echo: EF 25-30%. diff HK. Gr2 DD; c. 06/2020 Echo: EF <20%, glob HK. RVS 53.24m58m. Sev BAE. Mild to mod TR.   Morbid obesity ([place]  Neuropathy    PAD (peripheral artery disease) ([facility]  a. Followed by Dr. DewLucky Cowboy. 08/2010 Periph Angio: RSFA 70-80p (6X60 self-expanding stent); c. 09/2010 Periph Angio: L SFA 100p (7x unknown length self-expanding stent); d. 06/2015 Periph Angio: R SFA short segment occlusion (6x12 self-expanding stent).   Persistent atrial fibrillation (HCC)    Restless leg syndrome    Sepsis ([place]CC[place]0/21/2019   Sleep apnea    Subclavian artery stenosis, right (HCC)    Syncope and collapse    Tobacco use    a. 75+ yr hx - still smoking 1ppd, down from 2 ppd.   Type II diabetes mellitus (HCC)    Unstable angina ([facility]CCCl[place]2/06/2016   Varicose vein      SURGICAL HISTORY   Past Surgical History:  Procedure Laterality Date   ABDOMINAL AORTAGRAM N/A 06/23/2013   Procedure: ABDOMINAL AORMaxcine HamSurgeon: MuhWellington HampshireD;  [facility];  Service: Cardiovascular;  Laterality: N/A;   AMPUTATION Right 04/01/2018   Procedure: AMPUTATION BELOW KNEE;  Surgeon: DewAlgernon HuxleyD;  Location: [facility];  Service: Vascular;  Laterality: Right;   AMPUTATION TOE Left 08/11/2020   Procedure: AMPUTATION TOE-2nd Toe;  Surgeon: FowSamara DeistPM;  Location: [facility];  Service: Podiatry;  Laterality: Left;   APPENDECTOMY     CARDIAC CATHETERIZATION  10/14   [facility] STENT PROXIMAL RCA   C[place]IAC [facility]HE[place]ATION  02/2010   [facility] [facility]   CLAVICLE SURGERY     HERNIA REPAIR     IRRIGATION AND DEBRIDEMENT FOOT Right 01/04/2018   Procedure: IRRIGATION AND DEBRIDEMENT FOOT;  Surgeon: FowSamara DeistPM;  Location: [facility];  Service:  Podiatry;  Laterality: Right;   LEFT HEART CATH AND CORONARY ANGIOGRAPHY N/A 01/20/2020   Procedure: LEFT HEART CATH AND CORONARY ANGIOGRAPHY;  Surgeon: AriWellington HampshireD;  [facility];  Service: Cardiovascular;  Laterality: N/A;   LEFT HEART CATH AND CORS/GRAFTS ANGIOGRAPHY N/A 04/28/2017   Procedure: LEFT HEART CATH AND CORONARY ANGIOGRAPHY;  Surgeon: AriWellington HampshireD;  [facility];  Service: Cardiovascular;  Laterality: N/A;   LOWER EXTREMITY ANGIOGRAPHY Right 03/03/2018   Procedure: LOWER EXTREMITY ANGIOGRAPHY;  Surgeon: SchKatha CabalD;  [facility];  Service: Cardiovascular;  Laterality: Right;  LOWER EXTREMITY ANGIOGRAPHY Left 08/24/2018   Procedure: LOWER EXTREMITY ANGIOGRAPHY;  Surgeon: Algernon Huxley, MD;  Location: [place] [facility];  Service: Cardiovascular;  Laterality: Left;   LOWER EXTREMITY ANGIOGRAPHY Left 06/14/2019   Procedure: LOWER EXTREMITY ANGIOGRAPHY;  Surgeon: Algernon Huxley, MD;  Location: [place] [facility];  Service: Cardiovascular;  Laterality: Left;   LOWER EXTREMITY ANGIOGRAPHY Left 08/02/2019   Procedure: LOWER EXTREMITY ANGIOGRAPHY;  Surgeon: Algernon Huxley, MD;  Location: [place] [facility];  Service: Cardiovascular;  Laterality: Left;   LOWER EXTREMITY ANGIOGRAPHY Left 07/17/2020   Procedure: LOWER EXTREMITY ANGIOGRAPHY;  Surgeon: Algernon Huxley, MD;  Location: [place] [facility];  Service: Cardiovascular;  Laterality: Left;   PERIPHERAL ARTERIAL STENT GRAFT     x2 left/right   PERIPHERAL VASCULAR BALLOON ANGIOPLASTY Right 01/06/2018   Procedure: PERIPHERAL VASCULAR BALLOON ANGIOPLASTY;  Surgeon: Katha Cabal, MD;  Location: [place] [facility];  Service: Cardiovascular;  Laterality: Right;   PERIPHERAL VASCULAR CATHETERIZATION N/A 07/06/2015   Procedure: Abdominal Aortogram w/Lower Extremity;  Surgeon: Algernon Huxley, MD;  Location: [place] [facility];  Service: Cardiovascular;  Laterality:  N/A;   PERIPHERAL VASCULAR CATHETERIZATION  07/06/2015   Procedure: Lower Extremity Intervention;  Surgeon: Algernon Huxley, MD;  Location: [place] [facility];  Service: Cardiovascular;;     FAMILY HISTORY   Family History  Problem Relation Age of Onset   Heart attack Father    Heart disease Father    Hypertension Father    Hyperlipidemia Father    Hypertension Mother    Hyperlipidemia Mother      SOCIAL HISTORY   Social History   Tobacco Use   Smoking status: Every Day    Packs/day: 1.00    Years: 41.00    Pack years: 41.00    Types: Cigarettes   Smokeless tobacco: Never  Vaping Use   Vaping Use: Never used  Substance Use Topics   Alcohol use: No   Drug use: No     MEDICATIONS   Current Medication:  Current Facility-Administered Medications:    docusate sodium (COLACE) capsule 100 mg, 100 mg, Oral, BID PRN, Lang Snow, NP   furosemide (LASIX) injection 20 mg, 20 mg, Intravenous, Q12H, [place], Bing Neighbors, NP, 20 mg at 06/15/21 2112   heparin ADULT infusion 100 units/mL (25000 units/21m), 1,650 Units/hr, Intravenous, Continuous, MNoralee Space RPH, Last Rate: 16.5 mL/hr at 06/15/21 2045, 1,650 Units/hr at 06/15/21 2045   morphine 2 MG/ML injection 2 mg, 2 mg, Intravenous, Q4H PRN, [place], BToribio HarbourL, NP   polyethylene glycol (MIRALAX / GLYCOLAX) packet 17 g, 17 g, Oral, Daily PRN, OStark Klein EBing Neighbors NP  Current Outpatient Medications:    albuterol (VENTOLIN HFA) 108 (90 Base) MCG/ACT inhaler, Inhale into the lungs every 6 (six) hours as needed for wheezing or shortness of breath., Disp: , Rfl:    apixaban (ELIQUIS) 5 MG TABS tablet, Take 5 mg by mouth 2 (two) times daily., Disp: , Rfl:    aspirin 81 MG EC tablet, Take 81 mg by mouth daily., Disp: , Rfl:    atorvastatin (LIPITOR) 80 MG tablet, TAKE 1 TABLET BY MOUTH ONCE DAILY, Disp: 90 tablet, Rfl: 0   carvedilol (COREG) 3.125 MG tablet, TAKE 1 TABLET BY MOUTH TWICE DAILY WITH  MEALS, Disp: 180 tablet, Rfl: 0   clopidogrel (PLAVIX) 75 MG tablet, TAKE 1 TABLET BY MOUTH ONCE DAILY, Disp: 90 tablet, Rfl: 0   digoxin (LANOXIN) 0.125 MG  tablet, TAKE 1 TABLET BY MOUTH ONCE DAILY, Disp: 90 tablet, Rfl: 0   DULoxetine (CYMBALTA) 60 MG capsule, Take 60 mg by mouth daily., Disp: , Rfl:    ezetimibe (ZETIA) 10 MG tablet, Take 1 tablet (10 mg total) by mouth daily., Disp: 90 tablet, Rfl: 3   Fluticasone-Umeclidin-Vilant 100-62.5-25 MCG/INH AEPB, Inhale 1 puff into the lungs daily., Disp: , Rfl:    gabapentin (NEURONTIN) 800 MG tablet, Take 800 mg by mouth 4 (four) times daily. , Disp: , Rfl:    ipratropium-albuterol (DUONEB) 0.5-2.5 (3) MG/3ML SOLN, Inhale 3 mLs into the lungs every 6 (six) hours as needed (wheezing/sob). , Disp: , Rfl:    isosorbide mononitrate (IMDUR) 30 MG 24 hr tablet, Take 1 tablet (30 mg total) by mouth daily., Disp: 90 tablet, Rfl: 3   losartan (COZAAR) 25 MG tablet, Take 0.5 tablets (12.5 mg total) by mouth daily., Disp: 46 tablet, Rfl: 3   metFORMIN (GLUCOPHAGE-XR) 500 MG 24 hr tablet, Take 1,000 mg by mouth 2 (two) times daily., Disp: , Rfl:    metolazone (ZAROXOLYN) 2.5 MG tablet, Take 1 tablet (2.5 mg total) by mouth daily as needed. Take 1/2 hour before morning torsemide, Disp: 10 tablet, Rfl: 0   Multiple Vitamin (MULTI-VITAMINS) TABS, Take 1 tablet by mouth daily. , Disp: , Rfl:    omeprazole (PRILOSEC) 40 MG capsule, Take 40 mg by mouth daily., Disp: , Rfl:    Oxycodone HCl 10 MG TABS, Take 10 mg by mouth every 4 (four) hours as needed (pain)., Disp: , Rfl:    polyethylene glycol (MIRALAX / GLYCOLAX) 17 g packet, Take 17 g by mouth daily as needed for mild constipation., Disp: 14 each, Rfl: 0   potassium chloride (KLOR-CON) 10 MEQ tablet, TAKE 1 TABLET BY MOUTH TWICE DAILY, Disp: 180 tablet, Rfl: 0   promethazine (PHENERGAN) 25 MG tablet, Take 25 mg by mouth every 8 (eight) hours as needed., Disp: , Rfl:    spironolactone (ALDACTONE) 25 MG tablet,  Take 0.5 tablets (12.5 mg total) by mouth daily., Disp: 90 tablet, Rfl: 3   tamsulosin (FLOMAX) 0.4 MG CAPS capsule, Take 1 capsule (0.4 mg total) by mouth daily after supper., Disp: 30 capsule, Rfl: 1   torsemide 40 MG TABS, Take 40 mg by mouth 2 (two) times daily. Take 40 mg by mouth twice daily., Disp: 60 tablet, Rfl: 0   traZODone (DESYREL) 150 MG tablet, Take 150 mg by mouth at bedtime., Disp: , Rfl:    nitroGLYCERIN (NITROSTAT) 0.4 MG SL tablet, Place 1 tablet (0.4 mg total) under the tongue every 5 (five) minutes as needed for chest pain., Disp: 30 tablet, Rfl: 5   VERQUVO 10 MG TABS, TAKE 1 TABLET BY MOUTH ONCE DAILY, Disp: 30 tablet, Rfl: 5    ALLERGIES   Dulaglutide, Ace inhibitors, Other, and Prednisone    REVIEW OF SYSTEMS     10 point ROS done and is negative except for mild chest discomfort on left  PHYSICAL EXAMINATION   Vital Signs: Temp:  [97.7 F (36.5 C)-98 F (36.7 C)] 97.7 F (36.5 C) (01/06 2046) Pulse Rate:  [35-105] 79 (01/07 0800) Resp:  [13-24] 17 (01/07 0800) BP: (57-148)/(29-123) 124/81 (01/07 0800) SpO2:  [88 %-100 %] 96 % (01/07 0800)  GENERAL:Age appropriate HEAD: Normocephalic, atraumatic.  EYES: Pupils equal, round, reactive to light.  No scleral icterus.  MOUTH: Moist mucosal membrane. NECK: Supple. No thyromegaly. No nodules. No JVD.  PULMONARY: left sided expiratory wheezing CARDIOVASCULAR: S1  and S2. Regular rate and rhythm. No murmurs, rubs, or gallops.  GASTROINTESTINAL: Soft, nontender, non-distended. No masses. Positive bowel sounds. No hepatosplenomegaly.  MUSCULOSKELETAL: No swelling, clubbing, or edema.  NEUROLOGIC: Mild distress due to acute illness SKIN:intact,warm,dry   PERTINENT DATA     Infusions:  heparin 1,650 Units/hr (06/15/21 2045)   Scheduled Medications:  furosemide  20 mg Intravenous Q12H   PRN Medications: docusate sodium, morphine injection, polyethylene glycol Hemodynamic parameters:    Intake/Output: 01/06 0701 - 01/07 0700 In: 240 [P.O.:240] Out: 550 [Urine:550]  Ventilator  Settings:     LAB RESULTS:  Basic Metabolic Panel: Recent Labs  Lab 06/14/21 1557 06/15/21 0347 06/16/21 0241  NA 131* 132* 127*  K 4.5 4.8 4.8  CL 95* 98 93*  CO2 _0 GLUCOSE 107* 248* 138*  BUN 28* 29* 32*  CREATININE 1.33* 1.24 1.26*  CALCIUM 8.9 8.5* 8.8*  MG  --  1.4* 2.1  PHOS  --  4.4 2.7    Liver Function Tests: Recent Labs  Lab 06/14/21 1557  AST 20  ALT 20  ALKPHOS 93  BILITOT 2.1*  PROT 7.6  ALBUMIN 3.7    No results for input(s): LIPASE, AMYLASE in the last 168 hours. No results for input(s): AMMONIA in the last 168 hours. CBC: Recent Labs  Lab 06/14/21 1557 06/15/21 0347 06/16/21 0241  WBC 5.6 4.3 7.2  NEUTROABS 4.1  --   --   HGB 10.3* 10.2* 10.1*  HCT 31.4* 31.2* 31.0*  MCV 82.8 82.3 82.2  PLT 173 183 163    Cardiac Enzymes: No results for input(s): CKTOTAL, CKMB, CKMBINDEX, TROPONINI in the last 168 hours. BNP: Invalid input(s): POCBNP CBG: No results for input(s): GLUCAP in the last 168 hours.     IMAGING RESULTS:  Imaging: DG Chest 2 View  Result Date: 06/14/2021 CLINICAL DATA:  COPD exacerbation. EXAM: CHEST - 2 VIEW COMPARISON:  05/27/2021 FINDINGS: Chronic cardiomegaly, unchanged. Unchanged mediastinal contours with aortic atherosclerosis. Similar hyperinflation and peribronchial thickening. No focal airspace disease, pleural effusion, or pneumothorax. Minor thoracic spondylosis. Chronic widening of the right acromioclavicular joint. IMPRESSION: 1. No acute abnormality. 2. Stable cardiomegaly. 3. Chronic hyperinflation and peribronchial thickening consistent with provided history of COPD. Electronically Signed   By: Keith Rake M.D.   On: 06/14/2021 19:29   DG Tibia/Fibula Left  Result Date: 06/14/2021 CLINICAL DATA:  Diabetic wound infection. EXAM: LEFT TIBIA AND FIBULA - 2 VIEW COMPARISON:  None. FINDINGS: There is no  evidence of fracture or other focal bone lesions. Vascular calcifications are noted. IMPRESSION: Normal left tibia and fibula. Electronically Signed   By: Marijo Conception M.D.   On: 06/14/2021 16:49   DG Foot Complete Left  Result Date: 06/14/2021 CLINICAL DATA:  Left foot wound. EXAM: LEFT FOOT - COMPLETE 3+ VIEW COMPARISON:  None. FINDINGS: Status post amputation of second toe. No fracture or dislocation is noted. Joint spaces are intact. No lytic destruction is seen to suggest osteomyelitis. Soft tissues are unremarkable. IMPRESSION: No acute abnormality seen. Electronically Signed   By: Marijo Conception M.D.   On: 06/14/2021 16:51   ECHOCARDIOGRAM COMPLETE  Result Date: 06/15/2021    ECHOCARDIOGRAM REPORT   Patient Name:   TEJON GRACIE Date of Exam: 06/15/2021 Medical Rec #:  [id number]      Height:       67.0 in Accession #:    [id number]     Weight:       186.0  lb Date of Birth:  05-31-1961       BSA:          1.961 m Patient Age:    32 years       BP:           102/82 mmHg Patient Gender: M              HR:           60 bpm. Exam Location:  [facility] Procedure: 2D Echo, Color Doppler, Cardiac Doppler and Intracardiac            Opacification Agent Indications:     I50.21 congestive heart failure-Acute Systolic; R15.40                  congestive heart failure-Acute Diastolic  History:         Patient has prior history of Echocardiogram examinations, most                  recent 03/26/2021. Ischemic Cardiomyopathy; Risk                  Factors:Hypertension, Dyslipidemia, Current Smoker and                  Diabetes.  Sonographer:     Charmayne Sheer Referring Phys:  [place] Diagnosing Phys: Ida Rogue MD  Sonographer Comments: Suboptimal apical window. IMPRESSIONS  1. Left ventricular ejection fraction, by estimation, is <20%. The left ventricle has severely decreased function. The left ventricle demonstrates global hypokinesis. The left ventricular internal cavity size was severely  dilated. Left ventricular diastolic parameters are indeterminate.  2. Right ventricular systolic function is severely reduced. The right ventricular size is moderately enlarged. There is severely elevated pulmonary artery systolic pressure. The estimated right ventricular systolic pressure is 08.6 mmHg.  3. The mitral valve is normal in structure. No evidence of mitral valve regurgitation. No evidence of mitral stenosis.  4. The aortic valve is normal in structure. Aortic valve regurgitation is not visualized. Aortic valve sclerosis/calcification is present, without any evidence of aortic stenosis.  5. The inferior vena cava is dilated in size with <50% respiratory variability, suggesting right atrial pressure of 15 mmHg. FINDINGS  Left Ventricle: Left ventricular ejection fraction, by estimation, is <20%. The left ventricle has severely decreased function. The left ventricle demonstrates global hypokinesis. Definity contrast agent was given IV to delineate the left ventricular endocardial borders. The left ventricular internal cavity size was severely dilated. There is no left ventricular hypertrophy. Left ventricular diastolic parameters are indeterminate. Right Ventricle: The right ventricular size is moderately enlarged. No increase in right ventricular wall thickness. Right ventricular systolic function is severely reduced. There is severely elevated pulmonary artery systolic pressure. The tricuspid regurgitant velocity is 3.84 m/s, and with an assumed right atrial pressure of 15 mmHg, the estimated right ventricular systolic pressure is 76.1 mmHg. Left Atrium: Left atrial size was normal in size. Right Atrium: Right atrial size was normal in size. Pericardium: There is no evidence of pericardial effusion. Mitral Valve: The mitral valve is normal in structure. No evidence of mitral valve regurgitation. No evidence of mitral valve stenosis. MV peak gradient, 5.9 mmHg. The mean mitral valve gradient is 2.0 mmHg.  Tricuspid Valve: The tricuspid valve is normal in structure. Tricuspid valve regurgitation is mild . No evidence of tricuspid stenosis. Aortic Valve: The aortic valve is normal in structure. Aortic valve regurgitation is not visualized. Aortic valve sclerosis/calcification is present,  without any evidence of aortic stenosis. Aortic valve mean gradient measures 7.0 mmHg. Aortic valve peak  gradient measures 12.5 mmHg. Aortic valve area, by VTI measures 1.97 cm. Pulmonic Valve: The pulmonic valve was normal in structure. Pulmonic valve regurgitation is not visualized. No evidence of pulmonic stenosis. Aorta: The aortic root is normal in size and structure. Venous: The pulmonary veins were not well visualized. The inferior vena cava is dilated in size with less than 50% respiratory variability, suggesting right atrial pressure of 15 mmHg. IAS/Shunts: No atrial level shunt detected by color flow Doppler.  LEFT VENTRICLE PLAX 2D LVIDd:         6.47 cm   Diastology LVIDs:         6.09 cm   LV e' lateral:   4.79 cm/s LV PW:         1.30 cm   LV E/e' lateral: 25.7 LV IVS:        0.86 cm LVOT diam:     2.30 cm LV SV:         66 LV SV Index:   33 LVOT Area:     4.15 cm  RIGHT VENTRICLE RV Basal diam:  4.81 cm LEFT ATRIUM             Index        RIGHT ATRIUM           Index LA diam:        5.10 cm 2.60 cm/m   RA Area:     25.20 cm LA Vol (A2C):   70.5 ml 35.95 ml/m  RA Volume:   88.40 ml  45.08 ml/m LA Vol (A4C):   85.3 ml 43.50 ml/m LA Biplane Vol: 79.2 ml 40.39 ml/m  AORTIC VALVE                     PULMONIC VALVE AV Area (Vmax):    2.22 cm      PV Vmax:       0.79 m/s AV Area (Vmean):   2.20 cm      PV Vmean:      56.600 cm/s AV Area (VTI):     1.97 cm      PV VTI:        0.130 m AV Vmax:           177.00 cm/s   PV Peak grad:  2.5 mmHg AV Vmean:          127.000 cm/s  PV Mean grad:  1.0 mmHg AV VTI:            0.334 m AV Peak Grad:      12.5 mmHg AV Mean Grad:      7.0 mmHg LVOT Vmax:         94.60 cm/s LVOT  Vmean:        67.100 cm/s LVOT VTI:          0.158 m LVOT/AV VTI ratio: 0.47  AORTA Ao Root diam: 3.30 cm MITRAL VALVE                TRICUSPID VALVE MV Area (PHT): 4.97 cm     TR Peak grad:   59.0 mmHg MV Area VTI:   2.39 cm     TR Vmax:        384.00 cm/s MV Peak grad:  5.9 mmHg MV Mean grad:  2.0 mmHg     SHUNTS MV Vmax:  1.21 m/s     Systemic VTI:  0.16 m MV Vmean:      60.8 cm/s    Systemic Diam: 2.30 cm MV Decel Time: 153 msec MV E velocity: 123.00 cm/s Ida Rogue MD Electronically signed by Ida Rogue MD Signature Date/Time: 06/15/2021/2:14:28 PM    Final    _0 @ ECHOCARDIOGRAM COMPLETE  Result Date: 06/15/2021    ECHOCARDIOGRAM REPORT   Patient Name:   MALLIE LINNEMANN Date of Exam: 06/15/2021 Medical Rec #:  [id number]      Height:       67.0 in Accession #:    [id number]     Weight:       186.0 lb Date of Birth:  December 17, 1960       BSA:          1.961 m Patient Age:    3 years       BP:           102/82 mmHg Patient Gender: M              HR:           60 bpm. Exam Location:  [facility] Procedure: 2D Echo, Color Doppler, Cardiac Doppler and Intracardiac            Opacification Agent Indications:     I50.21 congestive heart failure-Acute Systolic; X45.85                  congestive heart failure-Acute Diastolic  History:         Patient has prior history of Echocardiogram examinations, most                  recent 03/26/2021. Ischemic Cardiomyopathy; Risk                  Factors:Hypertension, Dyslipidemia, Current Smoker and                  Diabetes.  Sonographer:     Charmayne Sheer Referring Phys:  [place] Diagnosing Phys: Ida Rogue MD  Sonographer Comments: Suboptimal apical window. IMPRESSIONS  1. Left ventricular ejection fraction, by estimation, is <20%. The left ventricle has severely decreased function. The left ventricle demonstrates global hypokinesis. The left ventricular internal cavity size was severely dilated. Left ventricular diastolic parameters are  indeterminate.  2. Right ventricular systolic function is severely reduced. The right ventricular size is moderately enlarged. There is severely elevated pulmonary artery systolic pressure. The estimated right ventricular systolic pressure is 92.9 mmHg.  3. The mitral valve is normal in structure. No evidence of mitral valve regurgitation. No evidence of mitral stenosis.  4. The aortic valve is normal in structure. Aortic valve regurgitation is not visualized. Aortic valve sclerosis/calcification is present, without any evidence of aortic stenosis.  5. The inferior vena cava is dilated in size with <50% respiratory variability, suggesting right atrial pressure of 15 mmHg. FINDINGS  Left Ventricle: Left ventricular ejection fraction, by estimation, is <20%. The left ventricle has severely decreased function. The left ventricle demonstrates global hypokinesis. Definity contrast agent was given IV to delineate the left ventricular endocardial borders. The left ventricular internal cavity size was severely dilated. There is no left ventricular hypertrophy. Left ventricular diastolic parameters are indeterminate. Right Ventricle: The right ventricular size is moderately enlarged. No increase in right ventricular wall thickness. Right ventricular systolic function is severely reduced. There is severely elevated pulmonary artery systolic pressure. The tricuspid regurgitant velocity is 3.84 m/s, and  with an assumed right atrial pressure of 15 mmHg, the estimated right ventricular systolic pressure is 32.6 mmHg. Left Atrium: Left atrial size was normal in size. Right Atrium: Right atrial size was normal in size. Pericardium: There is no evidence of pericardial effusion. Mitral Valve: The mitral valve is normal in structure. No evidence of mitral valve regurgitation. No evidence of mitral valve stenosis. MV peak gradient, 5.9 mmHg. The mean mitral valve gradient is 2.0 mmHg. Tricuspid Valve: The tricuspid valve is normal in  structure. Tricuspid valve regurgitation is mild . No evidence of tricuspid stenosis. Aortic Valve: The aortic valve is normal in structure. Aortic valve regurgitation is not visualized. Aortic valve sclerosis/calcification is present, without any evidence of aortic stenosis. Aortic valve mean gradient measures 7.0 mmHg. Aortic valve peak  gradient measures 12.5 mmHg. Aortic valve area, by VTI measures 1.97 cm. Pulmonic Valve: The pulmonic valve was normal in structure. Pulmonic valve regurgitation is not visualized. No evidence of pulmonic stenosis. Aorta: The aortic root is normal in size and structure. Venous: The pulmonary veins were not well visualized. The inferior vena cava is dilated in size with less than 50% respiratory variability, suggesting right atrial pressure of 15 mmHg. IAS/Shunts: No atrial level shunt detected by color flow Doppler.  LEFT VENTRICLE PLAX 2D LVIDd:         6.47 cm   Diastology LVIDs:         6.09 cm   LV e' lateral:   4.79 cm/s LV PW:         1.30 cm   LV E/e' lateral: 25.7 LV IVS:        0.86 cm LVOT diam:     2.30 cm LV SV:         66 LV SV Index:   33 LVOT Area:     4.15 cm  RIGHT VENTRICLE RV Basal diam:  4.81 cm LEFT ATRIUM             Index        RIGHT ATRIUM           Index LA diam:        5.10 cm 2.60 cm/m   RA Area:     25.20 cm LA Vol (A2C):   70.5 ml 35.95 ml/m  RA Volume:   88.40 ml  45.08 ml/m LA Vol (A4C):   85.3 ml 43.50 ml/m LA Biplane Vol: 79.2 ml 40.39 ml/m  AORTIC VALVE                     PULMONIC VALVE AV Area (Vmax):    2.22 cm      PV Vmax:       0.79 m/s AV Area (Vmean):   2.20 cm      PV Vmean:      56.600 cm/s AV Area (VTI):     1.97 cm      PV VTI:        0.130 m AV Vmax:           177.00 cm/s   PV Peak grad:  2.5 mmHg AV Vmean:          127.000 cm/s  PV Mean grad:  1.0 mmHg AV VTI:            0.334 m AV Peak Grad:      12.5 mmHg AV Mean Grad:      7.0 mmHg LVOT Vmax:  94.60 cm/s LVOT Vmean:        67.100 cm/s LVOT VTI:          0.158 m  LVOT/AV VTI ratio: 0.47  AORTA Ao Root diam: 3.30 cm MITRAL VALVE                TRICUSPID VALVE MV Area (PHT): 4.97 cm     TR Peak grad:   59.0 mmHg MV Area VTI:   2.39 cm     TR Vmax:        384.00 cm/s MV Peak grad:  5.9 mmHg MV Mean grad:  2.0 mmHg     SHUNTS MV Vmax:       1.21 m/s     Systemic VTI:  0.16 m MV Vmean:      60.8 cm/s    Systemic Diam: 2.30 cm MV Decel Time: 153 msec MV E velocity: 123.00 cm/s Ida Rogue MD Electronically signed by Ida Rogue MD Signature Date/Time: 06/15/2021/2:14:28 PM    Final      ASSESSMENT AND PLAN    -Multidisciplinary rounds held today  Acute Hypoxic Respiratory Failure-RESOLVED -continue Full MV support -continue Bronchodilator Therapy -Wean Fio2 and PEEP as tolerated -will perform SAT/SBT when respiratory parameters are met   Circulatory shock - present on admission     Cardiogenic - now resolved -oxygen as needed -Lasix as tolerated -follow up cardiac enzymes as indicated ICU monitoring  Sonographer Comments: Suboptimal apical window.  IMPRESSIONS    1. Left ventricular ejection fraction, by estimation, is <20%. The left  ventricle has severely decreased function. The left ventricle demonstrates  global hypokinesis. The left ventricular internal cavity size was severely  dilated. Left ventricular  diastolic parameters are indeterminate.   2. Right ventricular systolic function is severely reduced. The right  ventricular size is moderately enlarged. There is severely elevated  pulmonary artery systolic pressure. The estimated right ventricular  systolic pressure is 02.7 mmHg.   3. The mitral valve is normal in structure. No evidence of mitral valve  regurgitation. No evidence of mitral stenosis.   4. The aortic valve is normal in structure. Aortic valve regurgitation is  not visualized. Aortic valve sclerosis/calcification is present, without  any evidence of aortic stenosis.   5. The inferior vena cava is dilated in size  with <50% respiratory  variability, suggesting right atrial pressure of 15 mmHg.    Appreciate cardiology recommendations - Dr Fletcher Anon / Christell Faith -  Acute on chronic systolic heart failure: Based on his echocardiogram, he appears to be volume overloaded.  Avoid IV fluids and recommend gentle diuresis with IV furosemide. Heart failure medications are limited by hypotension.  Overall prognosis is very poor.   Renal Failure-most likely diabetic nephropathy -follow chem 7 -follow UO -continue Foley Catheter-assess need daily -improved overnight    ID -continue IV abx as prescibed -follow up cultures   GI/Nutrition GI PROPHYLAXIS as indicated DIET-->TF's as tolerated Constipation protocol as indicated  ENDO - ICU hypoglycemic\Hyperglycemia protocol -check FSBS per protocol   ELECTROLYTES -follow labs as needed -replace as needed -pharmacy consultation   DVT/GI PRX ordered -SCDs  TRANSFUSIONS AS NEEDED MONITOR FSBS ASSESS the need for LABS as needed Critical care provider statement:     Total critical care time: 33 minutes   Performed by: Lanney Gins MD   Critical care time was exclusive of separately billable procedures and treating other patients.   Critical care was necessary to treat or prevent imminent or life-threatening deterioration.  Critical care was time spent personally by me on the following activities: development of treatment plan with patient and/or surrogate as well as nursing, discussions with consultants, evaluation of patient's response to treatment, examination of patient, obtaining history from patient or surrogate, ordering and performing treatments and interventions, ordering and review of laboratory studies, ordering and review of radiographic studies, pulse oximetry and re-evaluation of patient's condition.    Ottie Glazier, M.D.  Pulmonary & Critical Care Medicine        Ottie Glazier, M.D.  Division of [place]

## 2021-06-16 NOTE — ED Notes (Signed)
Pt ambulatory to restroom and assisted back into recliner chair. Pt complaining of increased left leg pain. Pt also complaining of increased shortness of breath with activity

## 2021-06-16 NOTE — Consult Note (Signed)
ANTICOAGULATION CONSULT NOTE   Pharmacy Consult for Heparin Indication: chest pain/ACS  Allergies  Allergen Reactions   Dulaglutide Anaphylaxis, Diarrhea and Hives   Ace Inhibitors     hypotension   Other Itching    Skin itching associated with nitro patch   Prednisone Rash    Patient Measurements: Height: 5\' 7"  (170.2 cm) Weight: 84.4 kg (186 lb) IBW/kg (Calculated) : 66.1 Heparin Dosing Weight: 83.1 kg  Vital Signs: Temp: 97.7 F (36.5 C) (01/06 2046) Temp Source: Oral (01/06 2046) BP: 120/74 (01/07 0700) Pulse Rate: 94 (01/07 0700)  Labs: Recent Labs    06/14/21 1557 06/14/21 1908 06/14/21 2015 06/14/21 2052 06/14/21 2135 06/14/21 2135 06/15/21 0347 06/15/21 1030 06/15/21 1044 06/15/21 1928 06/16/21 0241  HGB 10.3*  --   --   --   --   --  10.2*  --   --   --  10.1*  HCT 31.4*  --   --   --   --   --  31.2*  --   --   --  31.0*  PLT 173  --   --   --   --   --  183  --   --   --  163  APTT  --   --  34  --  170*  --  53*  --   --   --   --   LABPROT  --   --  14.9  --  16.5*  --   --   --   --   --   --   INR  --   --  1.2  --  1.3*  --   --   --   --   --   --   HEPARINUNFRC  --   --   --   --  0.91*   < > 0.12* 0.19*  --  0.30 0.33  CREATININE 1.33*  --   --   --   --   --  1.24  --   --   --  1.26*  TROPONINIHS  --  341*  --  352*  --   --   --   --  182*  --   --    < > = values in this interval not displayed.     Estimated Creatinine Clearance: 66.3 mL/min (A) (by C-G formula based on SCr of 1.26 mg/dL (H)).   Medical History: Past Medical History:  Diagnosis Date   Acute on chronic heart failure ([place])    AKI (acute kidney injury) ([place]) 01/03/2018   Angina at rest [facility]) 08/13/2017   Arthritis    Asthma    Atherosclerosis    BPH (benign prostatic hyperplasia)    Carotid arterial disease ([place])    Cellulitis of foot, left 09/27/2019   Charcot's joint of foot, right    Chronic combined systolic (congestive) and diastolic (congestive) heart  failure ([place])    a. 02/2013 EF 50% by LV gram; b. 04/2017 Echo: EF 25-30%. diff HK. Gr2 DD; c. 06/2020 Echo: EF <20%, glob HK. RVS 53.47mmHg. Sev BAE. Mild to mod TR.   Chronic foot pain (Secondary Area of Pain) (Right) 01/29/2018   COPD (chronic obstructive pulmonary disease) (HCC)    COPD with acute exacerbation ([place]) 01/30/2020   Coronary artery disease    a. 2013 S/P PCI of LAD [facility]);  b. 03/2013 PCI: RCA 90p ( 2.5 x 23 mm DES); c. 02/2014 Cath:  patent RCA stent->Med Rx; d. 04/2017 Cath: LM nl, LAd 30ost/p, 59m/d, D1 80ost, LCX 90p/m, OM1 85, RCA 70/20p, 27m, 70d->Referred for CT Surg-felt to be poor candidate; d. 01/2020 Cath: LM nl, LAD 20p/m, 60d. D1 80. LCX 90p/m. LPAV 95, RCA 80/20p, 80p/m.   Diabetic neuropathy ([place])    Diabetic ulcer of right foot ([place])    Hernia    HFrEF (heart failure with reduced ejection fraction) ([place]) 01/30/2020   Hyperlipidemia    Hypertension    Ischemic cardiomyopathy    a. 02/2013 EF 50% by LV gram; b. 04/2017 Echo: EF 25-30%. diff HK. Gr2 DD; c. 06/2020 Echo: EF <20%, glob HK. RVS 53.2mmHg. Sev BAE. Mild to mod TR.   Morbid obesity ([place])    Neuropathy    PAD (peripheral artery disease) ([place])    a. Followed by Dr. Lucky Cowboy; b. 08/2010 Periph Angio: RSFA 70-80p (6X60 self-expanding stent); c. 09/2010 Periph Angio: L SFA 100p (7x unknown length self-expanding stent); d. 06/2015 Periph Angio: R SFA short segment occlusion (6x12 self-expanding stent).   Persistent atrial fibrillation (HCC)    Restless leg syndrome    Sepsis ([place]) 03/30/2018   Sleep apnea    Subclavian artery stenosis, right (HCC)    Syncope and collapse    Tobacco use    a. 75+ yr hx - still smoking 1ppd, down from 2 ppd.   Type II diabetes mellitus ([place])    Unstable angina ([place]) 07/11/2016   Varicose vein     Medications:  (Not in a hospital admission) Scheduled:   furosemide  20 mg Intravenous Q12H   Infusions:   heparin 1,650 Units/hr (06/15/21 2045)   PRN:  Anti-infectives (From  admission, onward)    Start     Dose/Rate Route Frequency Ordered Stop   06/14/21 2045  cefTRIAXone (ROCEPHIN) 1 g in sodium chloride 0.9 % 100 mL IVPB        1 g 200 mL/hr over 30 Minutes Intravenous  Once 06/14/21 2035 06/14/21 2111   06/14/21 1615  vancomycin (VANCOREADY) IVPB 2000 mg/400 mL        2,000 mg 200 mL/hr over 120 Minutes Intravenous  Once 06/14/21 1607 06/14/21 1852       Assessment: Pharmacy consulted for heparin for ACS. Pt is on apixaban for afib. Will use aPTT for monitoring and order heparin level for baseline.    1/5 2132 BASELINE aPTT 170 sec, HL 0.91 -- drawn shortly after initiation of heparin-- suspect error in collection 1/6 0347 aPTT 53 sec, HL 0.12 SUBtherapeutic 1/6 1030 HL 0.19  SUBtherapeutic   inc to 1600 u/hr 1/6 1928 HL=0.30  therap, barely  will increase to 1650 u/hr 1/7 0241 HL =0.33  Goal of Therapy:  Heparin level 0.3-0.7 units/ml aPTT 65-102 seconds.  Monitor platelets by anticoagulation protocol: Yes   Plan:  Heparin level is therapeutic. Will continue heparin infusion at 1650 units/hr. Recheck heparin level and CBC with AM labs.   Eleonore Chiquito, PharmD Clinical Pharmacist 06/16/2021

## 2021-06-16 NOTE — ED Notes (Signed)
Informed RN bed assigned 

## 2021-06-16 NOTE — ED Notes (Signed)
Patient continuously pulls off cardiac monitor, pulse oximetry and BP cuff. States he feels "like crap." Reports shortness of breath. Denies chest pain. AOX4.

## 2021-06-16 NOTE — Progress Notes (Addendum)
Progress Note  Patient Name: [name] [name] Date of Encounter: 06/16/2021  [facility] Cardiologist: Ida Rogue, MD   Subjective   Patient denies chest pain, breathing is the same. Off pressors. UOP -536mL. Asking when he can go home.   Inpatient Medications    Scheduled Meds:  furosemide  20 mg Intravenous Q12H   sodium chloride  1 g Oral TID WC   Continuous Infusions:  heparin 1,650 Units/hr (06/16/21 0907)   PRN Meds: docusate sodium, morphine injection, polyethylene glycol   Vital Signs    Vitals:   06/16/21 0530 06/16/21 0630 06/16/21 0700 06/16/21 0800  BP: (!) 84/72 117/63 120/74 124/81  Pulse: 85 91 94 79  Resp: 19 20 20 17   Temp:      TempSrc:      SpO2: 94% 95% 100% 96%  Weight:      Height:       No intake or output data in the 24 hours ending 06/16/21 1041 Last 3 Weights 06/14/2021 05/27/2021 05/22/2021  Weight (lbs) 186 lb 160 lb 181 lb  Weight (kg) 84.369 kg 72.576 kg 82.101 kg  Some encounter information is confidential and restricted. Go to Review Flowsheets activity to see all data.      Telemetry    Afib Hr80s, PVC - Personally Reviewed  ECG    No new - Personally Reviewed  Physical Exam   GEN: No acute distress.   Neck: No JVD Cardiac: Irreg Irreg, no murmurs, rubs, or gallops.  Respiratory: wheezing. GI: Soft, nontender, non-distended  MS: No edema Neuro:  Nonfocal  Psych: Normal affect   Labs    High Sensitivity Troponin:   Recent Labs  Lab 06/14/21 1908 06/14/21 2052 06/15/21 1044  TROPONINIHS 341* 352* 182*     Chemistry Recent Labs  Lab 06/14/21 1557 06/15/21 0347 06/16/21 0241  NA 131* 132* 127*  K 4.5 4.8 4.8  CL 95* 98 93*  CO2 30 25 27   GLUCOSE 107* 248* 138*  BUN 28* 29* 32*  CREATININE 1.33* 1.24 1.26*  CALCIUM 8.9 8.5* 8.8*  MG  --  1.4* 2.1  PROT 7.6  --   --   ALBUMIN 3.7  --   --   AST 20  --   --   ALT 20  --   --   ALKPHOS 93  --   --   BILITOT 2.1*  --   --   GFRNONAA >60 >60 >60   ANIONGAP 6 9 7     Lipids No results for input(s): CHOL, TRIG, HDL, LABVLDL, LDLCALC, CHOLHDL in the last 168 hours.  Hematology Recent Labs  Lab 06/14/21 1557 06/15/21 0347 06/16/21 0241  WBC 5.6 4.3 7.2  RBC 3.79* 3.79* 3.77*  HGB 10.3* 10.2* 10.1*  HCT 31.4* 31.2* 31.0*  MCV 82.8 82.3 82.2  MCH 27.2 26.9 26.8  MCHC 32.8 32.7 32.6  RDW 22.1* 22.0* 21.9*  PLT 173 183 163   Thyroid  Recent Labs  Lab 06/14/21 2052  TSH 2.617    BNP Recent Labs  Lab 06/14/21 1908  BNP 2,270.8*    DDimer No results for input(s): DDIMER in the last 168 hours.   Radiology    DG Chest 2 View  Result Date: 06/14/2021 CLINICAL DATA:  COPD exacerbation. EXAM: CHEST - 2 VIEW COMPARISON:  05/27/2021 FINDINGS: Chronic cardiomegaly, unchanged. Unchanged mediastinal contours with aortic atherosclerosis. Similar hyperinflation and peribronchial thickening. No focal airspace disease, pleural effusion, or pneumothorax. Minor thoracic spondylosis. Chronic widening  of the right acromioclavicular joint. IMPRESSION: 1. No acute abnormality. 2. Stable cardiomegaly. 3. Chronic hyperinflation and peribronchial thickening consistent with provided history of COPD. Electronically Signed   By: Keith Rake M.D.   On: 06/14/2021 19:29   DG Tibia/Fibula Left  Result Date: 06/14/2021 CLINICAL DATA:  Diabetic wound infection. EXAM: LEFT TIBIA AND FIBULA - 2 VIEW COMPARISON:  None. FINDINGS: There is no evidence of fracture or other focal bone lesions. Vascular calcifications are noted. IMPRESSION: Normal left tibia and fibula. Electronically Signed   By: Marijo Conception M.D.   On: 06/14/2021 16:49   DG Foot Complete Left  Result Date: 06/14/2021 CLINICAL DATA:  Left foot wound. EXAM: LEFT FOOT - COMPLETE 3+ VIEW COMPARISON:  None. FINDINGS: Status post amputation of second toe. No fracture or dislocation is noted. Joint spaces are intact. No lytic destruction is seen to suggest osteomyelitis. Soft tissues are  unremarkable. IMPRESSION: No acute abnormality seen. Electronically Signed   By: Marijo Conception M.D.   On: 06/14/2021 16:51   ECHOCARDIOGRAM COMPLETE  Result Date: 06/15/2021    ECHOCARDIOGRAM REPORT   Patient Name:   [name] [name] Date of Exam: 06/15/2021 Medical Rec #:  [id number]      Height:       67.0 in Accession #:    [id number]     Weight:       186.0 lb Date of Birth:  [birth year]       BSA:          1.961 m Patient Age:    61 years       BP:           102/82 mmHg Patient Gender: M              HR:           60 bpm. Exam Location:  [facility] Procedure: 2D Echo, Color Doppler, Cardiac Doppler and Intracardiac            Opacification Agent Indications:     I50.21 congestive heart failure-Acute Systolic; N05.39                  congestive heart failure-Acute Diastolic  History:         Patient has prior history of Echocardiogram examinations, most                  recent 03/26/2021. Ischemic Cardiomyopathy; Risk                  Factors:Hypertension, Dyslipidemia, Current Smoker and                  Diabetes.  Sonographer:     Charmayne Sheer Referring Phys:  [place] Diagnosing Phys: Ida Rogue MD  Sonographer Comments: Suboptimal apical window. IMPRESSIONS  1. Left ventricular ejection fraction, by estimation, is <20%. The left ventricle has severely decreased function. The left ventricle demonstrates global hypokinesis. The left ventricular internal cavity size was severely dilated. Left ventricular diastolic parameters are indeterminate.  2. Right ventricular systolic function is severely reduced. The right ventricular size is moderately enlarged. There is severely elevated pulmonary artery systolic pressure. The estimated right ventricular systolic pressure is 76.7 mmHg.  3. The mitral valve is normal in structure. No evidence of mitral valve regurgitation. No evidence of mitral stenosis.  4. The aortic valve is normal in structure. Aortic valve regurgitation is not visualized. Aortic  valve sclerosis/calcification is present, without any evidence  of aortic stenosis.  5. The inferior vena cava is dilated in size with <50% respiratory variability, suggesting right atrial pressure of 15 mmHg. FINDINGS  Left Ventricle: Left ventricular ejection fraction, by estimation, is <20%. The left ventricle has severely decreased function. The left ventricle demonstrates global hypokinesis. Definity contrast agent was given IV to delineate the left ventricular endocardial borders. The left ventricular internal cavity size was severely dilated. There is no left ventricular hypertrophy. Left ventricular diastolic parameters are indeterminate. Right Ventricle: The right ventricular size is moderately enlarged. No increase in right ventricular wall thickness. Right ventricular systolic function is severely reduced. There is severely elevated pulmonary artery systolic pressure. The tricuspid regurgitant velocity is 3.84 m/s, and with an assumed right atrial pressure of 15 mmHg, the estimated right ventricular systolic pressure is 26.2 mmHg. Left Atrium: Left atrial size was normal in size. Right Atrium: Right atrial size was normal in size. Pericardium: There is no evidence of pericardial effusion. Mitral Valve: The mitral valve is normal in structure. No evidence of mitral valve regurgitation. No evidence of mitral valve stenosis. MV peak gradient, 5.9 mmHg. The mean mitral valve gradient is 2.0 mmHg. Tricuspid Valve: The tricuspid valve is normal in structure. Tricuspid valve regurgitation is mild . No evidence of tricuspid stenosis. Aortic Valve: The aortic valve is normal in structure. Aortic valve regurgitation is not visualized. Aortic valve sclerosis/calcification is present, without any evidence of aortic stenosis. Aortic valve mean gradient measures 7.0 mmHg. Aortic valve peak  gradient measures 12.5 mmHg. Aortic valve area, by VTI measures 1.97 cm. Pulmonic Valve: The pulmonic valve was normal in  structure. Pulmonic valve regurgitation is not visualized. No evidence of pulmonic stenosis. Aorta: The aortic root is normal in size and structure. Venous: The pulmonary veins were not well visualized. The inferior vena cava is dilated in size with less than 50% respiratory variability, suggesting right atrial pressure of 15 mmHg. IAS/Shunts: No atrial level shunt detected by color flow Doppler.  LEFT VENTRICLE PLAX 2D LVIDd:         6.47 cm   Diastology LVIDs:         6.09 cm   LV e' lateral:   4.79 cm/s LV PW:         1.30 cm   LV E/e' lateral: 25.7 LV IVS:        0.86 cm LVOT diam:     2.30 cm LV SV:         66 LV SV Index:   33 LVOT Area:     4.15 cm  RIGHT VENTRICLE RV Basal diam:  4.81 cm LEFT ATRIUM             Index        RIGHT ATRIUM           Index LA diam:        5.10 cm 2.60 cm/m   RA Area:     25.20 cm LA Vol (A2C):   70.5 ml 35.95 ml/m  RA Volume:   88.40 ml  45.08 ml/m LA Vol (A4C):   85.3 ml 43.50 ml/m LA Biplane Vol: 79.2 ml 40.39 ml/m  AORTIC VALVE                     PULMONIC VALVE AV Area (Vmax):    2.22 cm      PV Vmax:       0.79 m/s AV Area (Vmean):   2.20 cm  PV Vmean:      56.600 cm/s AV Area (VTI):     1.97 cm      PV VTI:        0.130 m AV Vmax:           177.00 cm/s   PV Peak grad:  2.5 mmHg AV Vmean:          127.000 cm/s  PV Mean grad:  1.0 mmHg AV VTI:            0.334 m AV Peak Grad:      12.5 mmHg AV Mean Grad:      7.0 mmHg LVOT Vmax:         94.60 cm/s LVOT Vmean:        67.100 cm/s LVOT VTI:          0.158 m LVOT/AV VTI ratio: 0.47  AORTA Ao Root diam: 3.30 cm MITRAL VALVE                TRICUSPID VALVE MV Area (PHT): 4.97 cm     TR Peak grad:   59.0 mmHg MV Area VTI:   2.39 cm     TR Vmax:        384.00 cm/s MV Peak grad:  5.9 mmHg MV Mean grad:  2.0 mmHg     SHUNTS MV Vmax:       1.21 m/s     Systemic VTI:  0.16 m MV Vmean:      60.8 cm/s    Systemic Diam: 2.30 cm MV Decel Time: 153 msec MV E velocity: 123.00 cm/s Ida Rogue MD Electronically signed by  Ida Rogue MD Signature Date/Time: 06/15/2021/2:14:28 PM    Final     Cardiac Studies   2D echo 03/26/2021: 1. Left ventricular ejection fraction, by estimation, is 20 to 25%. The  left ventricle has severely decreased function. The left ventricle  demonstrates global hypokinesis. The left ventricular internal cavity size  was severely dilated. Left ventricular  diastolic function could not be evaluated.   2. Right ventricular systolic function is severely reduced. The right  ventricular size is mildly enlarged. There is severely elevated pulmonary  artery systolic pressure.   3. Left atrial size was mildly dilated.   4. Right atrial size was severely dilated.   5. The mitral valve is abnormal. Moderate mitral valve regurgitation.   6. Tricuspid valve regurgitation is moderate to severe.   7. The aortic valve has an indeterminant number of cusps. There is  moderate calcification of the aortic valve. There is moderate thickening  of the aortic valve. Aortic valve regurgitation is not visualized. There  appears to be mild aortic stenosis, with   LVOT gradient underestimated due to low LVEF.   8. The inferior vena cava is normal in size with <50% respiratory  variability, suggesting right atrial pressure of 8 mmHg. __________   2D echo 06/17/2020:  1. Left ventricular ejection fraction, by estimation, is <20%. Left  ventricular ejection fraction by PLAX is 14 %. The left ventricle has  severely decreased function. The left ventricle demonstrates global  hypokinesis. The left ventricular internal  cavity size was severely dilated. Left ventricular diastolic parameters  are indeterminate.   2. Right ventricular systolic function is severely reduced. The right  ventricular size is moderately enlarged. There is severely elevated  pulmonary artery systolic pressure. The estimated right ventricular  systolic pressure is 81.1 mmHg.   3. Left atrial size was severely dilated.  4. Right  atrial size was severely dilated.   5. Tricuspid valve regurgitation is mild to moderate.   6. The inferior vena cava is dilated in size with <50% respiratory  variability, suggesting right atrial pressure of 15 mmHg. __________   LHC 01/20/2020: Prox RCA-1 lesion is 80% stenosed. Prox RCA-2 lesion is 20% stenosed. Prox RCA to Mid RCA lesion is 80% stenosed. Prox LAD lesion is 20% stenosed. 1st Diag lesion is 80% stenosed. Mid LAD lesion is 20% stenosed. Dist LAD lesion is 60% stenosed. Prox Cx to Mid Cx lesion is 90% stenosed. LPAV lesion is 95% stenosed.   1. Significant underlying three-vessel coronary artery disease. Patent proximal LAD stent with mild in-stent restenosis. Moderate diffuse disease in the distal LAD. Significant stenosis in the proximal left circumflex at the origin of the posterior AV groove artery which is also heavily diseased at the ostium. This is a bifurcation lesion and heavily calcified. RCA stent is patent. However, there is significant proximal disease in the whole mid to distal segment is diffusely diseased and small caliber. 2. Left ventricular angiography was not performed. EF was severe reduced by echo. 3. Moderately elevated left ventricular end-diastolic pressure at 28 mmHg   Recommendations: The patient's cardiomyopathy seems to be out of proportion to his coronary artery disease as the LAD itself does not seem to have obstructive disease at the present time. The RCA is diffusely diseased and too small to stent. The only target for revascularization would be the left circumflex but it is a calcified bifurcation lesion and likely requires atherectomy. In order to do this safely, the patient has to be optimized from a heart failure standpoint as he appears to be volume overloaded. Recommend intravenous diuresis. If limited by hypotension, we might need to consider inotropic support and small dose norepinephrine drip initially. I added small dose  digoxin. Treat underlying atrial fibrillation. Resume heparin drip 8 hours after sheath pull. __________   2D echo 01/20/2020: 1. Left ventricular ejection fraction, by estimation, is <20%. The left  ventricle has severely decreased function. The left ventricle demonstrates  global hypokinesis. The left ventricular internal cavity size was  moderately dilated. Left ventricular  diastolic parameters are indeterminate.   2. Right ventricular systolic function is moderately reduced. The right  ventricular size is moderately enlarged. There is normal pulmonary artery  systolic pressure. The estimated right ventricular systolic pressure is  43.3 mmHg.   3. Left atrial size was mildly dilated.   4. Mild mitral valve regurgitation.   5. Tricuspid valve regurgitation is moderate. __________   2D echo 09/28/2019: 1. Left ventricular ejection fraction, by estimation, is 20 to 25%. The  left ventricle has severely decreased function. The left ventricle  demonstrates global hypokinesis. The left ventricular internal cavity size  was severely dilated. There is mild  left ventricular hypertrophy. Indeterminate diastolic filling due to E-A  fusion.   2. Right ventricular systolic function is moderately reduced. The right  ventricular size is mildly enlarged. There is moderately elevated  pulmonary artery systolic pressure. The estimated right ventricular  systolic pressure is 29.5 mmHg.   3. Left atrial size was mildly dilated.   4. Right atrial size was moderately dilated.   5. The mitral valve is degenerative. Mild mitral valve regurgitation. No  evidence of mitral stenosis.   6. Tricuspid valve regurgitation is mild to moderate.   7. The aortic valve was not well visualized. Aortic valve regurgitation  is not visualized. Mild aortic valve sclerosis  is present, with no  evidence of aortic valve stenosis.   8. The inferior vena cava is dilated in size with <50% respiratory  variability,  suggesting right atrial pressure of 15 mmHg. ___________   2D echo 11/06/2018: 1. The left ventricle has severely reduced systolic function, with an  ejection fraction of 20-25%. The cavity size was moderately dilated.  Indeterminate diastolic filling due to E-A fusion. Left ventricular  diffuse hypokinesis.   2. The right ventricle has severely reduced systolic function. The cavity  was mildly enlarged. There is no increase in right ventricular wall  thickness. Right ventricular systolic pressure is moderately elevated with  an estimated pressure of 59.6 mmHg.   3. Left atrial size was mildly dilated.   4. Right atrial size was mildly dilated.   5. The mitral valve is degenerative. Mild thickening of the mitral valve  leaflet. Mild calcification of the mitral valve leaflet. There is mild  mitral annular calcification present.   6. The tricuspid valve is degenerative. Tricuspid valve regurgitation is  moderate.   7. The aortic valve was not well visualized. Moderate thickening of the  aortic valve. Moderate calcification of the aortic valve.   8. The inferior vena cava was dilated in size with <50% respiratory  variability. __________   LHC 04/2017: Prox RCA-1 lesion is 70% stenosed. Prox RCA-2 lesion is 20% stenosed. Mid RCA lesion is 80% stenosed. Mid RCA to Dist RCA lesion is 70% stenosed. Ost LAD to Prox LAD lesion is 30% stenosed. Ost 1st Diag to 1st Diag lesion is 80% stenosed. Prox LAD to Mid LAD lesion is 60% stenosed. Dist LAD lesion is 60% stenosed. Prox Cx to Mid Cx lesion is 90% stenosed. Ost 1st Mrg to 1st Mrg lesion is 85% stenosed.   1.  Significant three-vessel coronary artery disease with patent stent in the RCA and LAD.  There is significant proximal RCA disease before the stent as well as diffuse mid and distal disease in a vessel that appears to be about 2.5 mm in diameter.  There is significant bifurcation stenosis in the proximal left circumflex with a large  OM1.  The LAD has moderate disease. The coronary arteries are moderately calcified and diffusely diseased throughout. 2.  Left ventricular angiography was not performed.  EF was moderately to severely reduced by echo. 3.  Severely elevated left ventricular end-diastolic pressure at 34 mmHg.   Recommendations: This is overall a difficult situation.  Revascularization options are somewhat limited due to diffuse disease overall.  Given that he is diabetic and has cardiomyopathy, best option might be CABG if he is found to be a candidate.  We need to optimize his heart failure first with outpatient referral to cardiothoracic surgery for evaluation.  I am going to increase intravenous diuresis today and the patient possibly can be discharged home tomorrow on medical therapy.    Patient Profile     61 y.o. male with a hx of CAD s/p prior PCI, chronic combined systolic and diastolic congestive heart failure, ICM, pulmonary hypertension, tobacco abuse, COPD, HTN, HLD, permanent atrial fibrillation, obesity, obstructive sleep apnea, type 2 diabetes mellitus, stage III chronic kidney disease, peripheral arterial disease status post right BKA, and carotid arterial disease, who is being seen today for the evaluation of SOB and chest tightness   Assessment & Plan   Shock - now off levophed - suspected cardiogenic - CCM following  Chronic combined systolic and diastolic CHF/ICM/pulmonary HTN - IV lasix 20mg  BID - PTA  GDMT held for hypotension requiring vasopressor support - strict I/Os, daily weights, monitor kidney function - kidney function mildly up  Elevated troponin CAD - NO chest pain reported - mildly elevated HF trop  - likely demand ischemia in the setting of shock, lower extremity cellulitis with diabetic ulcer, COPD exacerbation, AKI an underlying CAD - IV heparin - Continue PTA ASA, Plavix, Lipitor, Zetia - Imdur held for hypotension  Permanent Afib - rates controlled - PTA coreg  held for hypotension>resume as able - Digoxin held with AKI - CHADSVASC at least 4 - IV heparin - resume DOAC prior to discharge  PAD/DM - per IM - recommend vascular consult   For questions or updates, please contact [facility] Please consult [email or web address] for contact info under        Signed, Cadence Ninfa Meeker, PA-C  06/16/2021, 10:41 AM     I have seen and examined this patient with Cadence Furth.  Agree with above, note added to reflect my findings.  Patient's respiratory status is improved.  Currently feeling well.  Remains mildly volume overloaded.  GEN: Well nourished, well developed, in no acute distress  HEENT: normal  Neck: no JVD, carotid bruits, or masses Cardiac: Irregular; no murmurs, rubs, or gallops,no edema  Respiratory:  clear to auscultation bilaterally, normal work of breathing GI: soft, nontender, nondistended, + BS MS: no deformity or atrophy  Skin: warm and dry Neuro:  Strength and sensation are intact Psych: euthymic mood, full affect   Acute systolic heart failure: Volume status is improved.  We Jlynn Langille continue diuresis with IV Lasix.  Cassian Torelli need goal-directed medical therapy at discharge.   Shock: Currently off Levophed.  Suspect cardiogenic.  Critical care medicine following. Permanent atrial fibrillation: Rates well controlled.  Continue IV heparin.  Trenee Igoe need DOAC prior to discharge.  Cadie Sorci M. Azra Abrell MD 06/16/2021 2:06 PM

## 2021-06-16 NOTE — ED Notes (Signed)
Attempted to call report to ICU, but ICU state they are not accepting patient at this time.

## 2021-06-17 LAB — BASIC METABOLIC PANEL
Anion gap: 9 (ref 5–15)
BUN: 30 mg/dL — ABNORMAL HIGH (ref 6–20)
CO2: 27 mmol/L (ref 22–32)
Calcium: 9 mg/dL (ref 8.9–10.3)
Chloride: 91 mmol/L — ABNORMAL LOW (ref 98–111)
Creatinine, Ser: 0.99 mg/dL (ref 0.61–1.24)
GFR, Estimated: >60 mL/min (ref >60)
Glucose, Bld: 89 mg/dL (ref 70–99)
Potassium: 4.3 mmol/L (ref 3.5–5.1)
Sodium: 127 mmol/L — ABNORMAL LOW (ref 135–145)

## 2021-06-17 LAB — CBC
HCT: 29.8 % — ABNORMAL LOW (ref 39.0–52.0)
Hemoglobin: 9.7 g/dL — ABNORMAL LOW (ref 13.0–17.0)
MCH: 26.7 pg (ref 26.0–34.0)
MCHC: 32.6 g/dL (ref 30.0–36.0)
MCV: 82.1 fL (ref 80.0–100.0)
Platelets: 181 10*3/uL (ref 150–400)
RBC: 3.63 MIL/uL — ABNORMAL LOW (ref 4.22–5.81)
RDW: 21.7 % — ABNORMAL HIGH (ref 11.5–15.5)
WBC: 6 10*3/uL (ref 4.0–10.5)
nRBC: 0 % (ref 0.0–0.2)

## 2021-06-17 LAB — HEMOGLOBIN A1C
Hgb A1c MFr Bld: 6.1 % — ABNORMAL HIGH (ref 4.8–5.6)
Mean Plasma Glucose: 128.37 mg/dL

## 2021-06-17 LAB — CBG MONITORING, ED
Glucose-Capillary: 128 mg/dL — ABNORMAL HIGH (ref 70–99)
Glucose-Capillary: 113 mg/dL — ABNORMAL HIGH (ref 70–99)

## 2021-06-17 LAB — SODIUM: Sodium: 130 mmol/L — ABNORMAL LOW (ref 135–145)

## 2021-06-17 LAB — PHOSPHORUS: Phosphorus: 3.8 mg/dL (ref 2.5–4.6)

## 2021-06-17 LAB — HEPARIN LEVEL (UNFRACTIONATED): Heparin Unfractionated: >1.1 IU/mL — ABNORMAL HIGH (ref 0.30–0.70)

## 2021-06-17 LAB — MAGNESIUM: Magnesium: 1.9 mg/dL (ref 1.7–2.4)

## 2021-06-17 MED ORDER — INSULIN ASPART 100 UNIT/ML IJ SOLN
0.0000 [IU] | Freq: Three times a day (TID) | INTRAMUSCULAR | Status: DC
  Administered 2021-06-18 – 2021-06-19 (×2): 1 [IU] via SUBCUTANEOUS
  Filled 2021-06-17 (×2): qty 1

## 2021-06-17 MED ORDER — ALBUTEROL SULFATE (2.5 MG/3ML) 0.083% IN NEBU: 3.0000 mL | INHALATION_SOLUTION | Freq: Four times a day (QID) | RESPIRATORY_TRACT | Status: DC | PRN

## 2021-06-17 MED ORDER — SPIRONOLACTONE 25 MG PO TABS
12.5000 mg | ORAL_TABLET | Freq: Every day | ORAL | Status: DC
  Administered 2021-06-17 – 2021-06-19 (×3): 12.5 mg via ORAL
  Filled 2021-06-17 (×2): qty 0.5
  Filled 2021-06-17 (×3): qty 1
  Filled 2021-06-17: qty 0.5

## 2021-06-17 MED ORDER — NITROGLYCERIN 0.4 MG SL SUBL: 0.4000 mg | SUBLINGUAL_TABLET | SUBLINGUAL | Status: DC | PRN

## 2021-06-17 MED ORDER — ATORVASTATIN CALCIUM 80 MG PO TABS
80.0000 mg | ORAL_TABLET | Freq: Every day | ORAL | Status: DC
  Administered 2021-06-17 – 2021-06-19 (×3): 80 mg via ORAL
  Filled 2021-06-17: qty 4
  Filled 2021-06-17: qty 1
  Filled 2021-06-17: qty 4

## 2021-06-17 MED ORDER — ISOSORBIDE MONONITRATE ER 60 MG PO TB24: 30.0000 mg | ORAL_TABLET | Freq: Every day | ORAL | Status: DC

## 2021-06-17 MED ORDER — TAMSULOSIN HCL 0.4 MG PO CAPS
0.4000 mg | ORAL_CAPSULE | Freq: Every day | ORAL | Status: DC
  Administered 2021-06-17 – 2021-06-18 (×2): 0.4 mg via ORAL
  Filled 2021-06-17 (×2): qty 1

## 2021-06-17 MED ORDER — CARVEDILOL 6.25 MG PO TABS: 3.1250 mg | ORAL_TABLET | Freq: Two times a day (BID) | ORAL | Status: DC

## 2021-06-17 MED ORDER — INSULIN ASPART 100 UNIT/ML IJ SOLN: 0.0000 [IU] | Freq: Every day | INTRAMUSCULAR | Status: DC

## 2021-06-17 MED ORDER — EZETIMIBE 10 MG PO TABS
10.0000 mg | ORAL_TABLET | Freq: Every day | ORAL | Status: DC
  Administered 2021-06-17 – 2021-06-19 (×3): 10 mg via ORAL
  Filled 2021-06-17 (×3): qty 1

## 2021-06-17 MED ORDER — DIGOXIN 125 MCG PO TABS
125.0000 ug | ORAL_TABLET | Freq: Every day | ORAL | Status: DC
  Administered 2021-06-17 – 2021-06-19 (×3): 125 ug via ORAL
  Filled 2021-06-17 (×3): qty 1

## 2021-06-17 MED ORDER — OXYCODONE HCL 5 MG PO TABS
10.0000 mg | ORAL_TABLET | ORAL | Status: DC | PRN
  Administered 2021-06-17 – 2021-06-19 (×6): 10 mg via ORAL
  Filled 2021-06-17 (×6): qty 2

## 2021-06-17 NOTE — Progress Notes (Signed)
PROGRESS NOTE    [name] [name]  OFB:[id number] DOB: April 11, 1961 DOA: 06/14/2021 PCP: [name], MD    Brief Narrative:  61 year old gentleman with history of coronary artery disease and ischemic cardiomyopathy with known ejection fraction of 20 to 25%, COPD, hypertension, permanent A. fib, type 2 diabetes on metformin, hyperlipidemia, obstructive sleep apnea and BPH, tobacco use and medication noncompliance presented to the ER with increased pain and redness of the left lower extremity for 2 weeks, fell due to weakness and pain in his leg, worsening productive cough with exertional chest discomfort and shortness of breath. On arrival to emergency room, blood pressure 66/55 with pulse rate of 53.  100% on room air.  EKG with A. fib with ventricular rate 84 and right bundle branch block.  Troponin 341, BNP 2270.  Initially received fluid challenge of 1 L, treated with IV steroids and duo nebs.  Despite fluid challenge remained hypotensive so he was started on Levophed for possible cardiogenic shock.  Admitted to ICU. 1/4, admitted to ICU with cardiogenic shock and peripheral Levophed. 1/6, ultimately taken off Levophed.  Blood pressure is stabilized.  Transfer to telemetry bed.   Assessment & Plan:   Principal Problem:   Acute on chronic combined systolic and diastolic CHF (congestive heart failure) (HCC) Active Problems:   Hypotension  Acute on chronic combined systolic and diastolic congestive heart failure, known ischemic cardiomyopathy.  Cardiogenic shock: Adequately resuscitated and currently hemodynamically improved.  Renal functions improved. Echocardiogram with ejection fraction less than 20% and severe pulmonary hypertension. Tolerating diuresis with Lasix 20 mg twice daily.  Multiple heart failure regimen on hold. Patient on Coreg 3.125 mg twice daily at home, holding due to acute drop in EF. Blood pressures stabilizing, will resume Aldactone and digoxin today. Intake output  monitoring.  Daily weight.  Mobility. Cardiology following.  Coronary artery disease with mild elevated troponins: Suspect demand ischemia. Resume atorvastatin and Zetia.  Beta-blockers on hold.  On Eliquis.  Antiplatelet therapy in addition to Eliquis as per cardiology.  Permanent A. fib: Currently rate controlled.  Carvedilol on hold.  Resume digoxin.  Was on IV heparin, started on Eliquis.  AKI on CKD stage 2: Renal function improving. Continue monitoring.  Type 2 diabetes with severe peripheral vascular disease: Right BKA, prosthesis fitted Left foot with superficial infection,earlier on antibiotics.  Now discontinued. On metformin at home.  Start sliding scale insulin.  OSA : not on CPAP. Could not verify sleep apnea as per patient after 4 tests.    DVT prophylaxis: SCDs Start: 06/16/21 1729 apixaban (ELIQUIS) tablet 5 mg   Code Status: Full code Family Communication: Significant other on the phone. Disposition Plan: Status is: Inpatient  Remains inpatient appropriate because: Active treatment for congestive heart failure.    Consultants:  Cardiology Critical care  Procedures:  None  Antimicrobials:  Discontinued vancomycin and started on 1/4   Subjective: Patient seen and examined.  He was still in the emergency room.  He is asking about going home today.  I told him he is only able to tolerate 1 heart failure medicine.  We will watch him and put him on further medications before planning discharge.  He is agreeable.  Objective: Vitals:   06/17/21 0945 06/17/21 1000 06/17/21 1100 06/17/21 1200  BP:  128/73 (!) 128/94   Pulse: (!) 102 95 84 (!) 107  Resp: 10 (!) 25 19 17   Temp:      TempSrc:      SpO2: 99% 100%  97% 100%  Weight:      Height:       No intake or output data in the 24 hours ending 06/17/21 1350 Filed Weights   06/14/21 1553  Weight: 84.4 kg    Examination:  General exam: Frail and chronically sick looking.  Not in any distress.  On  room air. Respiratory system: No added sounds. Cardiovascular system: S1 & S2 heard, irregularly irregular. Right below-knee amputation stump clean and dry. Left leg is shiny with chronic ischemic changes with some superficial abrasions and skin excoriations. Gastrointestinal system: Abdomen is nondistended, soft and nontender. No organomegaly or masses felt. Normal bowel sounds heard. Central nervous system: Alert and oriented. No focal neurological deficits.   Data Reviewed: I have personally reviewed following labs and imaging studies  CBC: Recent Labs  Lab 06/14/21 1557 06/15/21 0347 06/16/21 0241 06/17/21 0556  WBC 5.6 4.3 7.2 6.0  NEUTROABS 4.1  --   --   --   HGB 10.3* 10.2* 10.1* 9.7*  HCT 31.4* 31.2* 31.0* 29.8*  MCV 82.8 82.3 82.2 82.1  PLT 173 183 163 818   Basic Metabolic Panel: Recent Labs  Lab 06/14/21 1557 06/15/21 0347 06/16/21 0241 06/16/21 1526 06/16/21 1940 06/17/21 0556 06/17/21 0643  NA 131* 132* 127* 126* 129* 127* 130*  K 4.5 4.8 4.8  --   --  4.3  --   CL 95* 98 93*  --   --  91*  --   CO2 30 25 27   --   --  27  --   GLUCOSE 107* 248* 138*  --   --  89  --   BUN 28* 29* 32*  --   --  30*  --   CREATININE 1.33* 1.24 1.26*  --   --  0.99  --   CALCIUM 8.9 8.5* 8.8*  --   --  9.0  --   MG  --  1.4* 2.1  --   --  1.9  --   PHOS  --  4.4 2.7  --   --  3.8  --    GFR: Estimated Creatinine Clearance: 84.4 mL/min (by C-G formula based on SCr of 0.99 mg/dL). Liver Function Tests: Recent Labs  Lab 06/14/21 1557  AST 20  ALT 20  ALKPHOS 93  BILITOT 2.1*  PROT 7.6  ALBUMIN 3.7   No results for input(s): LIPASE, AMYLASE in the last 168 hours. No results for input(s): AMMONIA in the last 168 hours. Coagulation Profile: Recent Labs  Lab 06/14/21 2015 06/14/21 2135  INR 1.2 1.3*   Cardiac Enzymes: No results for input(s): CKTOTAL, CKMB, CKMBINDEX, TROPONINI in the last 168 hours. BNP (last 3 results) No results for input(s): PROBNP in the  last 8760 hours. HbA1C: No results for input(s): HGBA1C in the last 72 hours. CBG: No results for input(s): GLUCAP in the last 168 hours. Lipid Profile: No results for input(s): CHOL, HDL, LDLCALC, TRIG, CHOLHDL, LDLDIRECT in the last 72 hours. Thyroid Function Tests: Recent Labs    06/14/21 2052  TSH 2.617   Anemia Panel: No results for input(s): VITAMINB12, FOLATE, FERRITIN, TIBC, IRON, RETICCTPCT in the last 72 hours. Sepsis Labs: Recent Labs  Lab 06/14/21 1557 06/14/21 1855 06/14/21 1908 06/15/21 0347 06/16/21 0241  PROCALCITON  --   --  <0.10 <0.10 <0.10  LATICACIDVEN 1.0 <0.3*  --   --   --     Recent Results (from the past 240 hour(s))  Resp Panel by  RT-PCR (Flu A&B, Covid) Nasopharyngeal Swab     Status: None   Collection Time: 06/14/21  7:07 PM   Specimen: Nasopharyngeal Swab; Nasopharyngeal(NP) swabs in vial transport medium  Result Value Ref Range Status   SARS Coronavirus 2 by RT PCR NEGATIVE NEGATIVE Final    Comment: (NOTE) SARS-CoV-2 target nucleic acids are NOT DETECTED.  The SARS-CoV-2 RNA is generally detectable in upper respiratory specimens during the acute phase of infection. The lowest concentration of SARS-CoV-2 viral copies this assay can detect is 138 copies/mL. A negative result does not preclude SARS-Cov-2 infection and should not be used as the sole basis for treatment or other patient management decisions. A negative result may occur with  improper specimen collection/handling, submission of specimen other than nasopharyngeal swab, presence of viral mutation(s) within the areas targeted by this assay, and inadequate number of viral copies(<138 copies/mL). A negative result must be combined with clinical observations, patient history, and epidemiological information. The expected result is Negative.  Fact Sheet for Patients:  EntrepreneurPulse.com.au  Fact Sheet for Healthcare Providers:   IncredibleEmployment.be  This test is no t yet approved or cleared by the [place] FDA and  has been authorized for detection and/or diagnosis of SARS-CoV-2 by FDA under an Emergency Use Authorization (EUA). This EUA will remain  in effect (meaning this test can be used) for the duration of the COVID-19 declaration under Section 564(b)(1) of the Act, 21 U.S.C.section 360bbb-3(b)(1), unless the authorization is terminated  or revoked sooner.       Influenza A by PCR NEGATIVE NEGATIVE Final   Influenza B by PCR NEGATIVE NEGATIVE Final    Comment: (NOTE) The Xpert Xpress SARS-CoV-2/FLU/RSV plus assay is intended as an aid in the diagnosis of influenza from Nasopharyngeal swab specimens and should not be used as a sole basis for treatment. Nasal washings and aspirates are unacceptable for Xpert Xpress SARS-CoV-2/FLU/RSV testing.  Fact Sheet for Patients: EntrepreneurPulse.com.au  Fact Sheet for Healthcare Providers: IncredibleEmployment.be  This test is not yet approved or cleared by the [place] FDA and has been authorized for detection and/or diagnosis of SARS-CoV-2 by FDA under an Emergency Use Authorization (EUA). This EUA will remain in effect (meaning this test can be used) for the duration of the COVID-19 declaration under Section 564(b)(1) of the Act, 21 U.S.C. section 360bbb-3(b)(1), unless the authorization is terminated or revoked.  Performed at [facility], [address] [place], [place] [address]   Blood culture (single)     Status: None (Preliminary result)   Collection Time: 06/14/21  8:00 PM   Specimen: BLOOD  Result Value Ref Range Status   Specimen Description BLOOD BLOOD RIGHT HAND  Final   Special Requests   Final    BOTTLES DRAWN AEROBIC AND ANAEROBIC Blood Culture adequate volume   Culture   Final    NO GROWTH 3 DAYS Performed at [facility], [address],  [place], [place] [address]    Report Status PENDING  Incomplete  Respiratory (~20 pathogens) panel by PCR     Status: None   Collection Time: 06/15/21  7:28 PM   Specimen: Nasopharyngeal Swab; Respiratory  Result Value Ref Range Status   Adenovirus NOT DETECTED NOT DETECTED Final   Coronavirus 229E NOT DETECTED NOT DETECTED Final    Comment: (NOTE) The Coronavirus on the Respiratory Panel, DOES NOT test for the novel  Coronavirus (2019 nCoV)    Coronavirus HKU1 NOT DETECTED NOT DETECTED Final   Coronavirus NL63 NOT  DETECTED NOT DETECTED Final   Coronavirus OC43 NOT DETECTED NOT DETECTED Final   Metapneumovirus NOT DETECTED NOT DETECTED Final   Rhinovirus / Enterovirus NOT DETECTED NOT DETECTED Final   Influenza A NOT DETECTED NOT DETECTED Final   Influenza B NOT DETECTED NOT DETECTED Final   Parainfluenza Virus 1 NOT DETECTED NOT DETECTED Final   Parainfluenza Virus 2 NOT DETECTED NOT DETECTED Final   Parainfluenza Virus 3 NOT DETECTED NOT DETECTED Final   Parainfluenza Virus 4 NOT DETECTED NOT DETECTED Final   Respiratory Syncytial Virus NOT DETECTED NOT DETECTED Final   Bordetella pertussis NOT DETECTED NOT DETECTED Final   Bordetella Parapertussis NOT DETECTED NOT DETECTED Final   Chlamydophila pneumoniae NOT DETECTED NOT DETECTED Final   Mycoplasma pneumoniae NOT DETECTED NOT DETECTED Final    Comment: Performed at [facility], [place] [address] [place], [place] [address]         Radiology Studies: No results found.      Scheduled Meds:  apixaban  5 mg Oral BID   atorvastatin  80 mg Oral Daily   digoxin  125 mcg Oral Daily   DULoxetine  60 mg Oral Daily   ezetimibe  10 mg Oral Daily   furosemide  20 mg Intravenous Q12H   gabapentin  800 mg Oral QID   insulin aspart  0-5 Units Subcutaneous QHS   insulin aspart  0-9 Units Subcutaneous TID WC   sodium chloride  1 g Oral TID WC   spironolactone  12.5 mg Oral Daily   tamsulosin  0.4 mg Oral QPC supper    Continuous Infusions:   LOS: 3 days    Time spent: 35 minutes    Barb Merino, MD [facility] Pager [phone]

## 2021-06-17 NOTE — Consult Note (Signed)
PHARMACY CONSULT NOTE - FOLLOW UP  Pharmacy Consult for Electrolyte Monitoring and Replacement   Recent Labs: Potassium (mmol/L)  Date Value  06/17/2021 4.3  06/08/2014 4.3   Magnesium (mg/dL)  Date Value  06/17/2021 1.9  02/06/2014 1.4 (L)   Calcium (mg/dL)  Date Value  06/17/2021 9.0   Calcium, Total (mg/dL)  Date Value  06/08/2014 9.2   Albumin (g/dL)  Date Value  06/14/2021 3.7  01/29/2018 3.3 (L)  03/07/2014 3.9   Phosphorus (mg/dL)  Date Value  06/17/2021 3.8   Sodium (mmol/L)  Date Value  06/17/2021 130 (L)  04/17/2021 129 (L)  06/08/2014 136     Assessment: 61 y/o male presented 06/14/21 with complaints of diabetic wound infection and acute on chronic HF exacerbation. Pt with PMH of afib. Pharmacy to monitor and replace electrolytes as needed  Medications: --Lasix 20 mg IV Q12H  Goal of Therapy:  K: 4-5.1 Mag: 2-2.4 All other electrolytes WNL  Plan:  NO replacement required at this time.  Recheck electrolytes with AM labs  Oswald Hillock ,PharmD, BCPS Clinical Pharmacist 06/17/2021 8:11 AM

## 2021-06-17 NOTE — Progress Notes (Signed)
Progress Note  Patient Name: [name] [name] Date of Encounter: 06/17/2021  [facility] Cardiologist: Ida Rogue, MD   Subjective   Urine output not currently measured in the emergency room.  Breathing has improved.  Creatinine has improved.  Patient states that lower extremity edema has also improved.  Inpatient Medications    Scheduled Meds:  apixaban  5 mg Oral BID   DULoxetine  60 mg Oral Daily   furosemide  20 mg Intravenous Q12H   gabapentin  800 mg Oral QID   sodium chloride  1 g Oral TID WC   Continuous Infusions:   PRN Meds: docusate sodium, morphine injection, polyethylene glycol   Vital Signs    Vitals:   06/17/21 0945 06/17/21 1000 06/17/21 1100 06/17/21 1200  BP:  128/73 (!) 128/94   Pulse: (!) 102 95 84 (!) 107  Resp: 10 (!) 25 19 17   Temp:      TempSrc:      SpO2: 99% 100% 97% 100%  Weight:      Height:       No intake or output data in the 24 hours ending 06/17/21 1316 Last 3 Weights 06/14/2021 05/27/2021 05/22/2021  Weight (lbs) 186 lb 160 lb 181 lb  Weight (kg) 84.369 kg 72.576 kg 82.101 kg  Some encounter information is confidential and restricted. Go to Review Flowsheets activity to see all data.      Telemetry    Atrial fibrillation-personally reviewed  ECG    None new  Physical Exam   GEN: Well nourished, well developed, in no acute distress  HEENT: normal  Neck: no JVD, carotid bruits, or masses Cardiac: irregular; no murmurs, rubs, or gallops,no edema  Respiratory: Crackles at the lung base right greater than left GI: soft, nontender, nondistended, + BS MS: no deformity or atrophy  Skin: warm and dry Neuro:  Strength and sensation are intact Psych: euthymic mood, full affect   Labs    High Sensitivity Troponin:   Recent Labs  Lab 06/14/21 1908 06/14/21 2052 06/15/21 1044  TROPONINIHS 341* 352* 182*      Chemistry Recent Labs  Lab 06/14/21 1557 06/15/21 0347 06/16/21 0241 06/16/21 1526 06/16/21 1940  06/17/21 0556 06/17/21 0643  NA 131* 132* 127*   < > 129* 127* 130*  K 4.5 4.8 4.8  --   --  4.3  --   CL 95* 98 93*  --   --  91*  --   CO2 30 25 27   --   --  27  --   GLUCOSE 107* 248* 138*  --   --  89  --   BUN 28* 29* 32*  --   --  30*  --   CREATININE 1.33* 1.24 1.26*  --   --  0.99  --   CALCIUM 8.9 8.5* 8.8*  --   --  9.0  --   MG  --  1.4* 2.1  --   --  1.9  --   PROT 7.6  --   --   --   --   --   --   ALBUMIN 3.7  --   --   --   --   --   --   AST 20  --   --   --   --   --   --   ALT 20  --   --   --   --   --   --  ALKPHOS 93  --   --   --   --   --   --   BILITOT 2.1*  --   --   --   --   --   --   GFRNONAA >60 >60 >60  --   --  >60  --   ANIONGAP 6 9 7   --   --  9  --    < > = values in this interval not displayed.     Lipids No results for input(s): CHOL, TRIG, HDL, LABVLDL, LDLCALC, CHOLHDL in the last 168 hours.  Hematology Recent Labs  Lab 06/15/21 0347 06/16/21 0241 06/17/21 0556  WBC 4.3 7.2 6.0  RBC 3.79* 3.77* 3.63*  HGB 10.2* 10.1* 9.7*  HCT 31.2* 31.0* 29.8*  MCV 82.3 82.2 82.1  MCH 26.9 26.8 26.7  MCHC 32.7 32.6 32.6  RDW 22.0* 21.9* 21.7*  PLT 183 163 181    Thyroid  Recent Labs  Lab 06/14/21 2052  TSH 2.617     BNP Recent Labs  Lab 06/14/21 1908  BNP 2,270.8*     DDimer No results for input(s): DDIMER in the last 168 hours.   Radiology    No results found.  Cardiac Studies   2D echo 03/26/2021: 1. Left ventricular ejection fraction, by estimation, is 20 to 25%. The  left ventricle has severely decreased function. The left ventricle  demonstrates global hypokinesis. The left ventricular internal cavity size  was severely dilated. Left ventricular  diastolic function could not be evaluated.   2. Right ventricular systolic function is severely reduced. The right  ventricular size is mildly enlarged. There is severely elevated pulmonary  artery systolic pressure.   3. Left atrial size was mildly dilated.   4. Right  atrial size was severely dilated.   5. The mitral valve is abnormal. Moderate mitral valve regurgitation.   6. Tricuspid valve regurgitation is moderate to severe.   7. The aortic valve has an indeterminant number of cusps. There is  moderate calcification of the aortic valve. There is moderate thickening  of the aortic valve. Aortic valve regurgitation is not visualized. There  appears to be mild aortic stenosis, with   LVOT gradient underestimated due to low LVEF.   8. The inferior vena cava is normal in size with <50% respiratory  variability, suggesting right atrial pressure of 8 mmHg. __________   2D echo 06/17/2020:  1. Left ventricular ejection fraction, by estimation, is <20%. Left  ventricular ejection fraction by PLAX is 14 %. The left ventricle has  severely decreased function. The left ventricle demonstrates global  hypokinesis. The left ventricular internal  cavity size was severely dilated. Left ventricular diastolic parameters  are indeterminate.   2. Right ventricular systolic function is severely reduced. The right  ventricular size is moderately enlarged. There is severely elevated  pulmonary artery systolic pressure. The estimated right ventricular  systolic pressure is 76.7 mmHg.   3. Left atrial size was severely dilated.   4. Right atrial size was severely dilated.   5. Tricuspid valve regurgitation is mild to moderate.   6. The inferior vena cava is dilated in size with <50% respiratory  variability, suggesting right atrial pressure of 15 mmHg. __________   LHC 01/20/2020: Prox RCA-1 lesion is 80% stenosed. Prox RCA-2 lesion is 20% stenosed. Prox RCA to Mid RCA lesion is 80% stenosed. Prox LAD lesion is 20% stenosed. 1st Diag lesion is 80% stenosed. Mid LAD lesion is 20% stenosed. Dist  LAD lesion is 60% stenosed. Prox Cx to Mid Cx lesion is 90% stenosed. LPAV lesion is 95% stenosed.   1. Significant underlying three-vessel coronary artery disease. Patent  proximal LAD stent with mild in-stent restenosis. Moderate diffuse disease in the distal LAD. Significant stenosis in the proximal left circumflex at the origin of the posterior AV groove artery which is also heavily diseased at the ostium. This is a bifurcation lesion and heavily calcified. RCA stent is patent. However, there is significant proximal disease in the whole mid to distal segment is diffusely diseased and small caliber. 2. Left ventricular angiography was not performed. EF was severe reduced by echo. 3. Moderately elevated left ventricular end-diastolic pressure at 28 mmHg   Recommendations: The patient's cardiomyopathy seems to be out of proportion to his coronary artery disease as the LAD itself does not seem to have obstructive disease at the present time. The RCA is diffusely diseased and too small to stent. The only target for revascularization would be the left circumflex but it is a calcified bifurcation lesion and likely requires atherectomy. In order to do this safely, the patient has to be optimized from a heart failure standpoint as he appears to be volume overloaded. Recommend intravenous diuresis. If limited by hypotension, we might need to consider inotropic support and small dose norepinephrine drip initially. I added small dose digoxin. Treat underlying atrial fibrillation. Resume heparin drip 8 hours after sheath pull. __________   2D echo 01/20/2020: 1. Left ventricular ejection fraction, by estimation, is <20%. The left  ventricle has severely decreased function. The left ventricle demonstrates  global hypokinesis. The left ventricular internal cavity size was  moderately dilated. Left ventricular  diastolic parameters are indeterminate.   2. Right ventricular systolic function is moderately reduced. The right  ventricular size is moderately enlarged. There is normal pulmonary artery  systolic pressure. The estimated right ventricular systolic pressure is  56.3  mmHg.   3. Left atrial size was mildly dilated.   4. Mild mitral valve regurgitation.   5. Tricuspid valve regurgitation is moderate. __________   2D echo 09/28/2019: 1. Left ventricular ejection fraction, by estimation, is 20 to 25%. The  left ventricle has severely decreased function. The left ventricle  demonstrates global hypokinesis. The left ventricular internal cavity size  was severely dilated. There is mild  left ventricular hypertrophy. Indeterminate diastolic filling due to E-A  fusion.   2. Right ventricular systolic function is moderately reduced. The right  ventricular size is mildly enlarged. There is moderately elevated  pulmonary artery systolic pressure. The estimated right ventricular  systolic pressure is 87.5 mmHg.   3. Left atrial size was mildly dilated.   4. Right atrial size was moderately dilated.   5. The mitral valve is degenerative. Mild mitral valve regurgitation. No  evidence of mitral stenosis.   6. Tricuspid valve regurgitation is mild to moderate.   7. The aortic valve was not well visualized. Aortic valve regurgitation  is not visualized. Mild aortic valve sclerosis is present, with no  evidence of aortic valve stenosis.   8. The inferior vena cava is dilated in size with <50% respiratory  variability, suggesting right atrial pressure of 15 mmHg. ___________   2D echo 11/06/2018: 1. The left ventricle has severely reduced systolic function, with an  ejection fraction of 20-25%. The cavity size was moderately dilated.  Indeterminate diastolic filling due to E-A fusion. Left ventricular  diffuse hypokinesis.   2. The right ventricle has severely reduced systolic function.  The cavity  was mildly enlarged. There is no increase in right ventricular wall  thickness. Right ventricular systolic pressure is moderately elevated with  an estimated pressure of 59.6 mmHg.   3. Left atrial size was mildly dilated.   4. Right atrial size was mildly dilated.    5. The mitral valve is degenerative. Mild thickening of the mitral valve  leaflet. Mild calcification of the mitral valve leaflet. There is mild  mitral annular calcification present.   6. The tricuspid valve is degenerative. Tricuspid valve regurgitation is  moderate.   7. The aortic valve was not well visualized. Moderate thickening of the  aortic valve. Moderate calcification of the aortic valve.   8. The inferior vena cava was dilated in size with <50% respiratory  variability. __________   LHC 04/2017: Prox RCA-1 lesion is 70% stenosed. Prox RCA-2 lesion is 20% stenosed. Mid RCA lesion is 80% stenosed. Mid RCA to Dist RCA lesion is 70% stenosed. Ost LAD to Prox LAD lesion is 30% stenosed. Ost 1st Diag to 1st Diag lesion is 80% stenosed. Prox LAD to Mid LAD lesion is 60% stenosed. Dist LAD lesion is 60% stenosed. Prox Cx to Mid Cx lesion is 90% stenosed. Ost 1st Mrg to 1st Mrg lesion is 85% stenosed.   1.  Significant three-vessel coronary artery disease with patent stent in the RCA and LAD.  There is significant proximal RCA disease before the stent as well as diffuse mid and distal disease in a vessel that appears to be about 2.5 mm in diameter.  There is significant bifurcation stenosis in the proximal left circumflex with a large OM1.  The LAD has moderate disease. The coronary arteries are moderately calcified and diffusely diseased throughout. 2.  Left ventricular angiography was not performed.  EF was moderately to severely reduced by echo. 3.  Severely elevated left ventricular end-diastolic pressure at 34 mmHg.   Recommendations: This is overall a difficult situation.  Revascularization options are somewhat limited due to diffuse disease overall.  Given that he is diabetic and has cardiomyopathy, best option might be CABG if he is found to be a candidate.  We need to optimize his heart failure first with outpatient referral to cardiothoracic surgery for evaluation.  I am  going to increase intravenous diuresis today and the patient possibly can be discharged home tomorrow on medical therapy.    Patient Profile     60 y.o. male with a hx of CAD s/p prior PCI, chronic combined systolic and diastolic congestive heart failure, ICM, pulmonary hypertension, tobacco abuse, COPD, HTN, HLD, permanent atrial fibrillation, obesity, obstructive sleep apnea, type 2 diabetes mellitus, stage III chronic kidney disease, peripheral arterial disease status post right BKA, and carotid arterial disease, who is being seen today for the evaluation of SOB and chest tightness   Assessment & Plan   1.  Shock: Now off Levophed.  Suspect cardiogenic.  Continue therapy for heart failure.  2.  Acute on chronic systolic heart failure with pulmonary hypertension: Continue IV Lasix.  Has had apparently good urine output with improvement in creatinine.  Once he gets to the floor, we Tonie Vizcarrondo need to measure urine output more accurately.  3.  Elevated troponin: No chest pain noted.  Likely due to demand ischemia in the setting of shock.  Continue aspirin, Plavix, Lipitor, Zetia.  4.  Permanent atrial fibrillation: Rates well controlled.  CHA2DS2-VASc of 4.  Continue IV heparin.  NOAC prior to discharge.  5.  PAD/diabetes: Plan per  internal medicine.  Potential vascular consult for foot wounds.   For questions or updates, please contact [place] Please consult [email or web address] for contact info under        Signed, Tyquavious Gamel Meredith Leeds, MD  06/17/2021, 1:16 PM

## 2021-06-18 ENCOUNTER — Encounter: Payer: Self-pay | Admitting: Internal Medicine

## 2021-06-18 ENCOUNTER — Other Ambulatory Visit: Payer: Self-pay

## 2021-06-18 LAB — BASIC METABOLIC PANEL
Anion gap: 9 (ref 5–15)
BUN: 31 mg/dL — ABNORMAL HIGH (ref 6–20)
CO2: 26 mmol/L (ref 22–32)
Calcium: 8.8 mg/dL — ABNORMAL LOW (ref 8.9–10.3)
Chloride: 92 mmol/L — ABNORMAL LOW (ref 98–111)
Creatinine, Ser: 0.97 mg/dL (ref 0.61–1.24)
GFR, Estimated: >60 mL/min (ref >60)
Glucose, Bld: 131 mg/dL — ABNORMAL HIGH (ref 70–99)
Potassium: 4.4 mmol/L (ref 3.5–5.1)
Sodium: 127 mmol/L — ABNORMAL LOW (ref 135–145)

## 2021-06-18 LAB — CBG MONITORING, ED
Glucose-Capillary: 131 mg/dL — ABNORMAL HIGH (ref 70–99)
Glucose-Capillary: 131 mg/dL — ABNORMAL HIGH (ref 70–99)
Glucose-Capillary: 120 mg/dL — ABNORMAL HIGH (ref 70–99)

## 2021-06-18 LAB — GLUCOSE, CAPILLARY
Glucose-Capillary: 115 mg/dL — ABNORMAL HIGH (ref 70–99)
Glucose-Capillary: 167 mg/dL — ABNORMAL HIGH (ref 70–99)

## 2021-06-18 MED ORDER — LOSARTAN POTASSIUM 25 MG PO TABS
12.5000 mg | ORAL_TABLET | Freq: Every day | ORAL | Status: DC
  Administered 2021-06-18 – 2021-06-19 (×2): 12.5 mg via ORAL
  Filled 2021-06-18: qty 0.5
  Filled 2021-06-18: qty 1

## 2021-06-18 MED ORDER — CARVEDILOL 3.125 MG PO TABS
3.1250 mg | ORAL_TABLET | Freq: Two times a day (BID) | ORAL | Status: DC
  Administered 2021-06-18 – 2021-06-19 (×3): 3.125 mg via ORAL
  Filled 2021-06-18 (×2): qty 1

## 2021-06-18 NOTE — Progress Notes (Signed)
Progress Note  Patient Name: [name] [name] Date of Encounter: 06/18/2021  [facility] Cardiologist: Ida Rogue, MD   Subjective   No chest pain. Dyspnea and left lower extremity swelling improved. Documented UOP 1.2 L for the past 24 hours. Renal function stable.   Inpatient Medications    Scheduled Meds:  apixaban  5 mg Oral BID   atorvastatin  80 mg Oral Daily   carvedilol  3.125 mg Oral BID WC   digoxin  125 mcg Oral Daily   DULoxetine  60 mg Oral Daily   ezetimibe  10 mg Oral Daily   furosemide  20 mg Intravenous Q12H   gabapentin  800 mg Oral QID   insulin aspart  0-5 Units Subcutaneous QHS   insulin aspart  0-9 Units Subcutaneous TID WC   losartan  12.5 mg Oral Daily   spironolactone  12.5 mg Oral Daily   tamsulosin  0.4 mg Oral QPC supper   Continuous Infusions:   PRN Meds: albuterol, docusate sodium, morphine injection, nitroGLYCERIN, oxyCODONE, polyethylene glycol   Vital Signs    Vitals:   06/18/21 1130 06/18/21 1200 06/18/21 1230 06/18/21 1500  BP: 106/84 107/81 98/65 100/71  Pulse: 75 75 74 67  Resp: 19 14 15 13   Temp:      TempSrc:      SpO2: 92%  91% 92%  Weight:      Height:        Intake/Output Summary (Last 24 hours) at 06/18/2021 1526 Last data filed at 06/18/2021 0657 Gross per 24 hour  Intake --  Output 1200 ml  Net -1200 ml   Last 3 Weights 06/14/2021 05/27/2021 05/22/2021  Weight (lbs) 186 lb 160 lb 181 lb  Weight (kg) 84.369 kg 72.576 kg 82.101 kg  Some encounter information is confidential and restricted. Go to Review Flowsheets activity to see all data.      Telemetry    Atrial fibrillation - personally reviewed  ECG    Afib, 80 bpm, RBBB (known), PVC, no significant changes when compared to admission  Physical Exam   GEN: Well nourished, well developed, in no acute distress  HEENT: normal  Neck: no JVD, carotid bruits, or masses Cardiac: irregular; no murmurs, rubs, or gallops,no edema  Respiratory: Improved  breath sounds with faint crackles at the lung base right greater than left, poor inspiratory effort  GI: soft, nontender, nondistended, + BS MS: no deformity or atrophy Improved left lower extremity swelling. No stump swelling of the right lower extremity  Skin: warm and dry Neuro:  Strength and sensation are intact Psych: euthymic mood, full affect   Labs    High Sensitivity Troponin:   Recent Labs  Lab 06/14/21 1908 06/14/21 2052 06/15/21 1044  TROPONINIHS 341* 352* 182*      Chemistry Recent Labs  Lab 06/14/21 1557 06/15/21 0347 06/16/21 0241 06/16/21 1526 06/17/21 0556 06/17/21 0643 06/18/21 0723  NA 131* 132* 127*   < > 127* 130* 127*  K 4.5 4.8 4.8  --  4.3  --  4.4  CL 95* 98 93*  --  91*  --  92*  CO2 30 25 27   --  27  --  26  GLUCOSE 107* 248* 138*  --  89  --  131*  BUN 28* 29* 32*  --  30*  --  31*  CREATININE 1.33* 1.24 1.26*  --  0.99  --  0.97  CALCIUM 8.9 8.5* 8.8*  --  9.0  --  8.8*  MG  --  1.4* 2.1  --  1.9  --   --   PROT 7.6  --   --   --   --   --   --   ALBUMIN 3.7  --   --   --   --   --   --   AST 20  --   --   --   --   --   --   ALT 20  --   --   --   --   --   --   ALKPHOS 93  --   --   --   --   --   --   BILITOT 2.1*  --   --   --   --   --   --   GFRNONAA >60 >60 >60  --  >60  --  >60  ANIONGAP 6 9 7   --  9  --  9   < > = values in this interval not displayed.     Lipids No results for input(s): CHOL, TRIG, HDL, LABVLDL, LDLCALC, CHOLHDL in the last 168 hours.  Hematology Recent Labs  Lab 06/15/21 0347 06/16/21 0241 06/17/21 0556  WBC 4.3 7.2 6.0  RBC 3.79* 3.77* 3.63*  HGB 10.2* 10.1* 9.7*  HCT 31.2* 31.0* 29.8*  MCV 82.3 82.2 82.1  MCH 26.9 26.8 26.7  MCHC 32.7 32.6 32.6  RDW 22.0* 21.9* 21.7*  PLT 183 163 181    Thyroid  Recent Labs  Lab 06/14/21 2052  TSH 2.617     BNP Recent Labs  Lab 06/14/21 1908  BNP 2,270.8*     DDimer No results for input(s): DDIMER in the last 168 hours.   Radiology    No  results found.  Cardiac Studies   2D echo 06/15/2021: 1. Left ventricular ejection fraction, by estimation, is <20%. The left  ventricle has severely decreased function. The left ventricle demonstrates  global hypokinesis. The left ventricular internal cavity size was severely  dilated. Left ventricular  diastolic parameters are indeterminate.   2. Right ventricular systolic function is severely reduced. The right  ventricular size is moderately enlarged. There is severely elevated  pulmonary artery systolic pressure. The estimated right ventricular  systolic pressure is 76.2 mmHg.   3. The mitral valve is normal in structure. No evidence of mitral valve  regurgitation. No evidence of mitral stenosis.   4. The aortic valve is normal in structure. Aortic valve regurgitation is  not visualized. Aortic valve sclerosis/calcification is present, without  any evidence of aortic stenosis.   5. The inferior vena cava is dilated in size with <50% respiratory  variability, suggesting right atrial pressure of 15 mmHg. ___________  2D echo 03/26/2021: 1. Left ventricular ejection fraction, by estimation, is 20 to 25%. The  left ventricle has severely decreased function. The left ventricle  demonstrates global hypokinesis. The left ventricular internal cavity size  was severely dilated. Left ventricular  diastolic function could not be evaluated.   2. Right ventricular systolic function is severely reduced. The right  ventricular size is mildly enlarged. There is severely elevated pulmonary  artery systolic pressure.   3. Left atrial size was mildly dilated.   4. Right atrial size was severely dilated.   5. The mitral valve is abnormal. Moderate mitral valve regurgitation.   6. Tricuspid valve regurgitation is moderate to severe.   7. The aortic valve has an indeterminant number of cusps.  There is  moderate calcification of the aortic valve. There is moderate thickening  of the aortic valve.  Aortic valve regurgitation is not visualized. There  appears to be mild aortic stenosis, with   LVOT gradient underestimated due to low LVEF.   8. The inferior vena cava is normal in size with <50% respiratory  variability, suggesting right atrial pressure of 8 mmHg. __________   2D echo 06/17/2020:  1. Left ventricular ejection fraction, by estimation, is <20%. Left  ventricular ejection fraction by PLAX is 14 %. The left ventricle has  severely decreased function. The left ventricle demonstrates global  hypokinesis. The left ventricular internal  cavity size was severely dilated. Left ventricular diastolic parameters  are indeterminate.   2. Right ventricular systolic function is severely reduced. The right  ventricular size is moderately enlarged. There is severely elevated  pulmonary artery systolic pressure. The estimated right ventricular  systolic pressure is 21.9 mmHg.   3. Left atrial size was severely dilated.   4. Right atrial size was severely dilated.   5. Tricuspid valve regurgitation is mild to moderate.   6. The inferior vena cava is dilated in size with <50% respiratory  variability, suggesting right atrial pressure of 15 mmHg. __________   LHC 01/20/2020: Prox RCA-1 lesion is 80% stenosed. Prox RCA-2 lesion is 20% stenosed. Prox RCA to Mid RCA lesion is 80% stenosed. Prox LAD lesion is 20% stenosed. 1st Diag lesion is 80% stenosed. Mid LAD lesion is 20% stenosed. Dist LAD lesion is 60% stenosed. Prox Cx to Mid Cx lesion is 90% stenosed. LPAV lesion is 95% stenosed.   1. Significant underlying three-vessel coronary artery disease. Patent proximal LAD stent with mild in-stent restenosis. Moderate diffuse disease in the distal LAD. Significant stenosis in the proximal left circumflex at the origin of the posterior AV groove artery which is also heavily diseased at the ostium. This is a bifurcation lesion and heavily calcified. RCA stent is patent. However, there is  significant proximal disease in the whole mid to distal segment is diffusely diseased and small caliber. 2. Left ventricular angiography was not performed. EF was severe reduced by echo. 3. Moderately elevated left ventricular end-diastolic pressure at 28 mmHg   Recommendations: The patient's cardiomyopathy seems to be out of proportion to his coronary artery disease as the LAD itself does not seem to have obstructive disease at the present time. The RCA is diffusely diseased and too small to stent. The only target for revascularization would be the left circumflex but it is a calcified bifurcation lesion and likely requires atherectomy. In order to do this safely, the patient has to be optimized from a heart failure standpoint as he appears to be volume overloaded. Recommend intravenous diuresis. If limited by hypotension, we might need to consider inotropic support and small dose norepinephrine drip initially. I added small dose digoxin. Treat underlying atrial fibrillation. Resume heparin drip 8 hours after sheath pull. __________   2D echo 01/20/2020: 1. Left ventricular ejection fraction, by estimation, is <20%. The left  ventricle has severely decreased function. The left ventricle demonstrates  global hypokinesis. The left ventricular internal cavity size was  moderately dilated. Left ventricular  diastolic parameters are indeterminate.   2. Right ventricular systolic function is moderately reduced. The right  ventricular size is moderately enlarged. There is normal pulmonary artery  systolic pressure. The estimated right ventricular systolic pressure is  75.8 mmHg.   3. Left atrial size was mildly dilated.   4. Mild mitral  valve regurgitation.   5. Tricuspid valve regurgitation is moderate. __________   2D echo 09/28/2019: 1. Left ventricular ejection fraction, by estimation, is 20 to 25%. The  left ventricle has severely decreased function. The left ventricle  demonstrates global  hypokinesis. The left ventricular internal cavity size  was severely dilated. There is mild  left ventricular hypertrophy. Indeterminate diastolic filling due to E-A  fusion.   2. Right ventricular systolic function is moderately reduced. The right  ventricular size is mildly enlarged. There is moderately elevated  pulmonary artery systolic pressure. The estimated right ventricular  systolic pressure is 47.8 mmHg.   3. Left atrial size was mildly dilated.   4. Right atrial size was moderately dilated.   5. The mitral valve is degenerative. Mild mitral valve regurgitation. No  evidence of mitral stenosis.   6. Tricuspid valve regurgitation is mild to moderate.   7. The aortic valve was not well visualized. Aortic valve regurgitation  is not visualized. Mild aortic valve sclerosis is present, with no  evidence of aortic valve stenosis.   8. The inferior vena cava is dilated in size with <50% respiratory  variability, suggesting right atrial pressure of 15 mmHg. ___________   2D echo 11/06/2018: 1. The left ventricle has severely reduced systolic function, with an  ejection fraction of 20-25%. The cavity size was moderately dilated.  Indeterminate diastolic filling due to E-A fusion. Left ventricular  diffuse hypokinesis.   2. The right ventricle has severely reduced systolic function. The cavity  was mildly enlarged. There is no increase in right ventricular wall  thickness. Right ventricular systolic pressure is moderately elevated with  an estimated pressure of 59.6 mmHg.   3. Left atrial size was mildly dilated.   4. Right atrial size was mildly dilated.   5. The mitral valve is degenerative. Mild thickening of the mitral valve  leaflet. Mild calcification of the mitral valve leaflet. There is mild  mitral annular calcification present.   6. The tricuspid valve is degenerative. Tricuspid valve regurgitation is  moderate.   7. The aortic valve was not well visualized. Moderate  thickening of the  aortic valve. Moderate calcification of the aortic valve.   8. The inferior vena cava was dilated in size with <50% respiratory  variability. __________   LHC 04/2017: Prox RCA-1 lesion is 70% stenosed. Prox RCA-2 lesion is 20% stenosed. Mid RCA lesion is 80% stenosed. Mid RCA to Dist RCA lesion is 70% stenosed. Ost LAD to Prox LAD lesion is 30% stenosed. Ost 1st Diag to 1st Diag lesion is 80% stenosed. Prox LAD to Mid LAD lesion is 60% stenosed. Dist LAD lesion is 60% stenosed. Prox Cx to Mid Cx lesion is 90% stenosed. Ost 1st Mrg to 1st Mrg lesion is 85% stenosed.   1.  Significant three-vessel coronary artery disease with patent stent in the RCA and LAD.  There is significant proximal RCA disease before the stent as well as diffuse mid and distal disease in a vessel that appears to be about 2.5 mm in diameter.  There is significant bifurcation stenosis in the proximal left circumflex with a large OM1.  The LAD has moderate disease. The coronary arteries are moderately calcified and diffusely diseased throughout. 2.  Left ventricular angiography was not performed.  EF was moderately to severely reduced by echo. 3.  Severely elevated left ventricular end-diastolic pressure at 34 mmHg.   Recommendations: This is overall a difficult situation.  Revascularization options are somewhat limited due to diffuse  disease overall.  Given that he is diabetic and has cardiomyopathy, best option might be CABG if he is found to be a candidate.  We need to optimize his heart failure first with outpatient referral to cardiothoracic surgery for evaluation.  I am going to increase intravenous diuresis today and the patient possibly can be discharged home tomorrow on medical therapy.    Patient Profile     61 y.o. male with a hx of CAD s/p prior PCI, chronic combined systolic and diastolic congestive heart failure, ICM, pulmonary hypertension, tobacco abuse, COPD, HTN, HLD, permanent  atrial fibrillation, obesity, obstructive sleep apnea, type 2 diabetes mellitus, stage III chronic kidney disease, peripheral arterial disease status post right BKA, and carotid arterial disease, who is being seen today for the evaluation of SOB and chest tightness   Assessment & Plan   1.  Suspected cardiogenic shock: Stable.  Now off Levophed.    2.  Acute on chronic systolic heart failure with pulmonary hypertension: Continue IV Lasix along with Coreg, digoxin, spironolactone, and losartan.  Has had apparently good urine output with stable creatinine.  Once he gets to the floor, we will need to measure urine output more accurately.  3.  Elevated troponin: No chest pain noted.  Likely due to demand ischemia in the setting of shock.  Continue aspirin, Plavix, Lipitor, Zetia. No plans for inpatient ischemic evaluation.   4.  Permanent atrial fibrillation: Rates well controlled with low dose Coreg and digoxin.  Check digoxin level tomorrow.  CHA2DS2-VASc of 4.  PTA Eliquis.  5.  PAD/diabetes: Plan per internal medicine.     For questions or updates, please contact [place] Please consult [email or web address] for contact info under        Signed, Christell Faith, PA-C  06/18/2021, 3:26 PM

## 2021-06-18 NOTE — ED Notes (Signed)
ED secretary will call transport to bring pt to floor.

## 2021-06-18 NOTE — Consult Note (Signed)
PHARMACY CONSULT NOTE - FOLLOW UP  Pharmacy Consult for Electrolyte Monitoring and Replacement   Recent Labs: Potassium (mmol/L)  Date Value  06/18/2021 4.4  06/08/2014 4.3   Magnesium (mg/dL)  Date Value  06/17/2021 1.9  02/06/2014 1.4 (L)   Calcium (mg/dL)  Date Value  06/18/2021 8.8 (L)   Calcium, Total (mg/dL)  Date Value  06/08/2014 9.2   Albumin (g/dL)  Date Value  06/14/2021 3.7  01/29/2018 3.3 (L)  03/07/2014 3.9   Phosphorus (mg/dL)  Date Value  06/17/2021 3.8   Sodium (mmol/L)  Date Value  06/18/2021 127 (L)  04/17/2021 129 (L)  06/08/2014 136     Assessment: 61 y/o male presented 06/14/21 with complaints of diabetic wound infection and acute on chronic HF exacerbation. Pt with PMH of afib. Pharmacy to monitor and replace electrolytes as needed  Medications: --Lasix 20 mg IV Q12H  Goal of Therapy:  K: 4-5.1 Mag: 2-2.4 All other electrolytes WNL  Plan:  Na 127 - will defer to primary team NO replacement required at this time.  Recheck electrolytes with AM labs  Sherilyn Banker ,PharmD, BCPS Clinical Pharmacist 06/18/2021 9:38 AM

## 2021-06-18 NOTE — ED Notes (Signed)
Pt will go to floor in wheelchair with ED tech. Cardiologist at bedside.

## 2021-06-18 NOTE — ED Notes (Signed)
Handed printed EKG to EDP who compared to prior EKGs (similar appearance). Also wrote to hospitalist to inform.

## 2021-06-18 NOTE — ED Notes (Signed)
Pt experiencing heartburn after eating. Performed 12 lead ekg to be sure. 12 lead reads a fib. Pt has some ST depression. Wrote to attending and exported ekg to chart.

## 2021-06-18 NOTE — ED Notes (Signed)
Pt sleeping lightly in recliner.

## 2021-06-18 NOTE — Progress Notes (Signed)
PROGRESS NOTE    [name] [name]  HYQ:[id number] DOB: 03-Jul-1960 DOA: 06/14/2021 PCP: [name], MD    Brief Narrative:  61 year old gentleman with history of coronary artery disease and ischemic cardiomyopathy with known ejection fraction of 20 to 25%, COPD, hypertension, permanent A. fib, type 2 diabetes on metformin, hyperlipidemia, obstructive sleep apnea and BPH, tobacco use and medication noncompliance presented to the ER with increased pain and redness of the left lower extremity for 2 weeks, fell due to weakness and pain in his leg, worsening productive cough with exertional chest discomfort and shortness of breath. On arrival to emergency room, blood pressure 66/55 with pulse rate of 53.  100% on room air.  EKG with A. fib with ventricular rate 84 and right bundle branch block.  Troponin 341, BNP 2270.  Initially received fluid challenge of 1 L, treated with IV steroids and duo nebs.  Despite fluid challenge remained hypotensive so he was started on Levophed for possible cardiogenic shock.  Admitted to ICU. 1/4, admitted to ICU with cardiogenic shock and peripheral Levophed. 1/6, ultimately taken off Levophed.  Blood pressure stabilized.  Transfer to telemetry bed.   Assessment & Plan:   Principal Problem:   Acute on chronic combined systolic and diastolic CHF (congestive heart failure) (HCC) Active Problems:   Hypotension   Cardiogenic shock (HCC)  Acute on chronic combined systolic and diastolic congestive heart failure, known ischemic cardiomyopathy.  Cardiogenic shock: Adequately resuscitated and currently hemodynamically improved.  Renal functions improved. Echocardiogram with ejection fraction less than 20% and severe pulmonary hypertension. Tolerating diuresis with Lasix 20 mg twice daily. Tolerating carvedilol, losartan, spironolactone and digoxin. Further management as per cardiology.  Coronary artery disease with mild elevated troponins: Suspect demand  ischemia. Resume atorvastatin and Zetia.  On Eliquis.  Antiplatelet therapy in addition to Eliquis as per cardiology.  Permanent A. fib: Currently rate controlled.  Resumed carvedilol and digoxin.  Therapeutic on Eliquis.  AKI on CKD stage 2: Renal function improving. Continue monitoring.  Type 2 diabetes with severe peripheral vascular disease: Right BKA, prosthesis fitted Left foot with superficial infection,earlier on antibiotics.  Now discontinued.  He does have extensive evidence of chronic ischemia.  Has vascular surgery follow-up as outpatient. On metformin at home.  On sliding scale insulin.  OSA : not on CPAP. Could not verify sleep apnea as per patient after 4 tests.   Hyponatremia: Hypochloremic hyponatremia with evidence of fluid overload.  Will correct with continuing diuresis.   DVT prophylaxis: SCDs Start: 06/16/21 1729 apixaban (ELIQUIS) tablet 5 mg   Code Status: Full code Family Communication: Significant other on the phone. Disposition Plan: Status is: Inpatient  Remains inpatient appropriate because: Active treatment for congestive heart failure.    Consultants:  Cardiology Critical care  Procedures:  None  Antimicrobials:  Discontinued vancomycin and started on 1/4   Subjective:  Seen and examined.  Still in the emergency room.  Denies any complaints.  Wants to go home.  Blood pressure stable.  Heart rate is stable A. fib.  Objective: Vitals:   06/18/21 1100 06/18/21 1130 06/18/21 1200 06/18/21 1230  BP: 96/75 106/84 107/81 98/65  Pulse: 61 75 75 74  Resp: (!) 22 19 14 15   Temp:      TempSrc:      SpO2: 93% 92%  91%  Weight:      Height:        Intake/Output Summary (Last 24 hours) at 06/18/2021 1236 Last data filed at 06/18/2021 [phone]  Gross per 24 hour  Intake --  Output 1200 ml  Net -1200 ml   Filed Weights   06/14/21 1553  Weight: 84.4 kg    Examination:  General exam: Frail and chronically sick looking.  Not in any distress.   On room air. Respiratory system: No added sounds. Cardiovascular system: S1 & S2 heard, irregularly irregular. Right below-knee amputation stump clean and dry. Left leg is shiny with chronic ischemic changes with some superficial abrasions and skin excoriations. Gastrointestinal system: Abdomen is nondistended, soft and nontender. No organomegaly or masses felt. Normal bowel sounds heard. Central nervous system: Alert and oriented. No focal neurological deficits.   Data Reviewed: I have personally reviewed following labs and imaging studies  CBC: Recent Labs  Lab 06/14/21 1557 06/15/21 0347 06/16/21 0241 06/17/21 0556  WBC 5.6 4.3 7.2 6.0  NEUTROABS 4.1  --   --   --   HGB 10.3* 10.2* 10.1* 9.7*  HCT 31.4* 31.2* 31.0* 29.8*  MCV 82.8 82.3 82.2 82.1  PLT 173 183 163 814   Basic Metabolic Panel: Recent Labs  Lab 06/14/21 1557 06/15/21 0347 06/16/21 0241 06/16/21 1526 06/16/21 1940 06/17/21 0556 06/17/21 0643 06/18/21 0723  NA 131* 132* 127* 126* 129* 127* 130* 127*  K 4.5 4.8 4.8  --   --  4.3  --  4.4  CL 95* 98 93*  --   --  91*  --  92*  CO2 30 25 27   --   --  27  --  26  GLUCOSE 107* 248* 138*  --   --  89  --  131*  BUN 28* 29* 32*  --   --  30*  --  31*  CREATININE 1.33* 1.24 1.26*  --   --  0.99  --  0.97  CALCIUM 8.9 8.5* 8.8*  --   --  9.0  --  8.8*  MG  --  1.4* 2.1  --   --  1.9  --   --   PHOS  --  4.4 2.7  --   --  3.8  --   --    GFR: Estimated Creatinine Clearance: 86.2 mL/min (by C-G formula based on SCr of 0.97 mg/dL). Liver Function Tests: Recent Labs  Lab 06/14/21 1557  AST 20  ALT 20  ALKPHOS 93  BILITOT 2.1*  PROT 7.6  ALBUMIN 3.7   No results for input(s): LIPASE, AMYLASE in the last 168 hours. No results for input(s): AMMONIA in the last 168 hours. Coagulation Profile: Recent Labs  Lab 06/14/21 2015 06/14/21 2135  INR 1.2 1.3*   Cardiac Enzymes: No results for input(s): CKTOTAL, CKMB, CKMBINDEX, TROPONINI in the last 168  hours. BNP (last 3 results) No results for input(s): PROBNP in the last 8760 hours. HbA1C: Recent Labs    06/17/21 0556  HGBA1C 6.1*   CBG: Recent Labs  Lab 06/17/21 1700 06/17/21 2145 06/18/21 0542 06/18/21 0749 06/18/21 1204  GLUCAP 128* 113* 131* 131* 120*   Lipid Profile: No results for input(s): CHOL, HDL, LDLCALC, TRIG, CHOLHDL, LDLDIRECT in the last 72 hours. Thyroid Function Tests: No results for input(s): TSH, T4TOTAL, FREET4, T3FREE, THYROIDAB in the last 72 hours.  Anemia Panel: No results for input(s): VITAMINB12, FOLATE, FERRITIN, TIBC, IRON, RETICCTPCT in the last 72 hours. Sepsis Labs: Recent Labs  Lab 06/14/21 1557 06/14/21 1855 06/14/21 1908 06/15/21 0347 06/16/21 0241  PROCALCITON  --   --  <0.10 <0.10 <0.10  LATICACIDVEN 1.0 <0.3*  --   --   --  Recent Results (from the past 240 hour(s))  Resp Panel by RT-PCR (Flu A&B, Covid) Nasopharyngeal Swab     Status: None   Collection Time: 06/14/21  7:07 PM   Specimen: Nasopharyngeal Swab; Nasopharyngeal(NP) swabs in vial transport medium  Result Value Ref Range Status   SARS Coronavirus 2 by RT PCR NEGATIVE NEGATIVE Final    Comment: (NOTE) SARS-CoV-2 target nucleic acids are NOT DETECTED.  The SARS-CoV-2 RNA is generally detectable in upper respiratory specimens during the acute phase of infection. The lowest concentration of SARS-CoV-2 viral copies this assay can detect is 138 copies/mL. A negative result does not preclude SARS-Cov-2 infection and should not be used as the sole basis for treatment or other patient management decisions. A negative result may occur with  improper specimen collection/handling, submission of specimen other than nasopharyngeal swab, presence of viral mutation(s) within the areas targeted by this assay, and inadequate number of viral copies(<138 copies/mL). A negative result must be combined with clinical observations, patient history, and  epidemiological information. The expected result is Negative.  Fact Sheet for Patients:  EntrepreneurPulse.com.au  Fact Sheet for Healthcare Providers:  IncredibleEmployment.be  This test is no t yet approved or cleared by the [place] FDA and  has been authorized for detection and/or diagnosis of SARS-CoV-2 by FDA under an Emergency Use Authorization (EUA). This EUA will remain  in effect (meaning this test can be used) for the duration of the COVID-19 declaration under Section 564(b)(1) of the Act, 21 U.S.C.section 360bbb-3(b)(1), unless the authorization is terminated  or revoked sooner.       Influenza A by PCR NEGATIVE NEGATIVE Final   Influenza B by PCR NEGATIVE NEGATIVE Final    Comment: (NOTE) The Xpert Xpress SARS-CoV-2/FLU/RSV plus assay is intended as an aid in the diagnosis of influenza from Nasopharyngeal swab specimens and should not be used as a sole basis for treatment. Nasal washings and aspirates are unacceptable for Xpert Xpress SARS-CoV-2/FLU/RSV testing.  Fact Sheet for Patients: EntrepreneurPulse.com.au  Fact Sheet for Healthcare Providers: IncredibleEmployment.be  This test is not yet approved or cleared by the [place] FDA and has been authorized for detection and/or diagnosis of SARS-CoV-2 by FDA under an Emergency Use Authorization (EUA). This EUA will remain in effect (meaning this test can be used) for the duration of the COVID-19 declaration under Section 564(b)(1) of the Act, 21 U.S.C. section 360bbb-3(b)(1), unless the authorization is terminated or revoked.  Performed at [facility], [address] [place], [place] [address]   Blood culture (single)     Status: None (Preliminary result)   Collection Time: 06/14/21  8:00 PM   Specimen: BLOOD  Result Value Ref Range Status   Specimen Description BLOOD BLOOD RIGHT HAND  Final   Special Requests    Final    BOTTLES DRAWN AEROBIC AND ANAEROBIC Blood Culture adequate volume   Culture   Final    NO GROWTH 4 DAYS Performed at [facility], [address], [place], [place] [address]    Report Status PENDING  Incomplete  Respiratory (~20 pathogens) panel by PCR     Status: None   Collection Time: 06/15/21  7:28 PM   Specimen: Nasopharyngeal Swab; Respiratory  Result Value Ref Range Status   Adenovirus NOT DETECTED NOT DETECTED Final   Coronavirus 229E NOT DETECTED NOT DETECTED Final    Comment: (NOTE) The Coronavirus on the Respiratory Panel, DOES NOT test for the novel  Coronavirus (2019 nCoV)    Coronavirus  HKU1 NOT DETECTED NOT DETECTED Final   Coronavirus NL63 NOT DETECTED NOT DETECTED Final   Coronavirus OC43 NOT DETECTED NOT DETECTED Final   Metapneumovirus NOT DETECTED NOT DETECTED Final   Rhinovirus / Enterovirus NOT DETECTED NOT DETECTED Final   Influenza A NOT DETECTED NOT DETECTED Final   Influenza B NOT DETECTED NOT DETECTED Final   Parainfluenza Virus 1 NOT DETECTED NOT DETECTED Final   Parainfluenza Virus 2 NOT DETECTED NOT DETECTED Final   Parainfluenza Virus 3 NOT DETECTED NOT DETECTED Final   Parainfluenza Virus 4 NOT DETECTED NOT DETECTED Final   Respiratory Syncytial Virus NOT DETECTED NOT DETECTED Final   Bordetella pertussis NOT DETECTED NOT DETECTED Final   Bordetella Parapertussis NOT DETECTED NOT DETECTED Final   Chlamydophila pneumoniae NOT DETECTED NOT DETECTED Final   Mycoplasma pneumoniae NOT DETECTED NOT DETECTED Final    Comment: Performed at [facility], [place] [address], [place], [place] [address]         Radiology Studies: No results found.      Scheduled Meds:  apixaban  5 mg Oral BID   atorvastatin  80 mg Oral Daily   carvedilol  3.125 mg Oral BID WC   digoxin  125 mcg Oral Daily   DULoxetine  60 mg Oral Daily   ezetimibe  10 mg Oral Daily   furosemide  20 mg Intravenous Q12H   gabapentin  800 mg Oral QID    insulin aspart  0-5 Units Subcutaneous QHS   insulin aspart  0-9 Units Subcutaneous TID WC   losartan  12.5 mg Oral Daily   spironolactone  12.5 mg Oral Daily   tamsulosin  0.4 mg Oral QPC supper   Continuous Infusions:   LOS: 4 days    Time spent: 35 minutes    Barb Merino, MD [facility] Pager [phone]

## 2021-06-18 NOTE — ED Notes (Signed)
Sent med messages for missing 10am meds.  Hospitalist at bedside. Wife on phone.

## 2021-06-18 NOTE — Plan of Care (Signed)
  Problem: Activity: Goal: Risk for activity intolerance will decrease Outcome: Progressing   

## 2021-06-18 NOTE — ED Notes (Signed)
Sent med msg to pharmacy for missing 0800 meds including scheduled carvedilol.

## 2021-06-19 ENCOUNTER — Encounter: Payer: Self-pay | Admitting: Internal Medicine

## 2021-06-19 LAB — PHOSPHORUS: Phosphorus: 3.4 mg/dL (ref 2.5–4.6)

## 2021-06-19 LAB — DIGOXIN LEVEL: Digoxin Level: 0.7 ng/mL — ABNORMAL LOW (ref 0.8–2.0)

## 2021-06-19 LAB — GLUCOSE, CAPILLARY
Glucose-Capillary: 136 mg/dL — ABNORMAL HIGH (ref 70–99)
Glucose-Capillary: 162 mg/dL — ABNORMAL HIGH (ref 70–99)

## 2021-06-19 LAB — BASIC METABOLIC PANEL
Anion gap: 7 (ref 5–15)
BUN: 29 mg/dL — ABNORMAL HIGH (ref 6–20)
CO2: 29 mmol/L (ref 22–32)
Calcium: 9 mg/dL (ref 8.9–10.3)
Chloride: 92 mmol/L — ABNORMAL LOW (ref 98–111)
Creatinine, Ser: 1.13 mg/dL (ref 0.61–1.24)
GFR, Estimated: >60 mL/min (ref >60)
Glucose, Bld: 110 mg/dL — ABNORMAL HIGH (ref 70–99)
Potassium: 4.7 mmol/L (ref 3.5–5.1)
Sodium: 128 mmol/L — ABNORMAL LOW (ref 135–145)

## 2021-06-19 LAB — CULTURE, BLOOD (SINGLE)
Culture: NO GROWTH
Special Requests: ADEQUATE

## 2021-06-19 LAB — MAGNESIUM: Magnesium: 2 mg/dL (ref 1.7–2.4)

## 2021-06-19 MED ORDER — TORSEMIDE 20 MG PO TABS: 40.0000 mg | ORAL_TABLET | Freq: Two times a day (BID) | ORAL | Status: DC

## 2021-06-19 NOTE — Discharge Summary (Signed)
Physician Discharge Summary  [name] [name]:[id number] DOB: 02-Jan-1961 DOA: 06/14/2021  PCP: [name], MD  Admit date: 06/14/2021 Discharge date: 06/19/2021  Admitted From: Home Disposition: Home  Recommendations for Outpatient Follow-up:  Follow up with PCP in 1-2 weeks Please obtain BMP/CBC in one week Ensure follow-up with cardiology Follow-up with vascular surgery, call and schedule follow-up.  Home Health: Not applicable Equipment/Devices: Not applicable  Discharge Condition: Stable CODE STATUS: Full code Diet recommendation: Low-salt and low-carb diet  Discharge summary: 61 year old gentleman with history of coronary artery disease and ischemic cardiomyopathy with known ejection fraction of 20 to 25%, COPD, hypertension, permanent A. fib, type 2 diabetes on metformin, hyperlipidemia, obstructive sleep apnea and BPH, tobacco use and medication noncompliance presented to the ER with increased pain and redness of the left lower extremity for 2 weeks, fell due to weakness and pain in his leg, worsening productive cough with exertional chest discomfort and shortness of breath. On arrival to emergency room, blood pressure 66/55 with pulse rate of 53.  100% on room air.  EKG with A. fib with ventricular rate 84 and right bundle branch block.  Troponin 341, BNP 2270.  Initially received fluid challenge of 1 L, treated with IV steroids and duo nebs.  Despite fluid challenge remained hypotensive so he was started on Levophed for possible cardiogenic shock and was admitted to ICU care however he remained in the emergency room for 2 days due to bed unavailability.  Ultimately he was taken off peripheral vasopressors on 1/6 and transferred to telemetry bed.  Treated for multiple conditions as below.    Acute on chronic combined systolic and diastolic congestive heart failure, known ischemic cardiomyopathy.  Cardiogenic shock: Adequately resuscitated and currently hemodynamically improved.   Renal functions improved. Echocardiogram with ejection fraction less than 20% and severe pulmonary hypertension. Tolerating diuresis with Lasix 20 mg twice daily.  Lasix converted to torsemide 40 mg twice a day that he was taking at home. With adequate improvement in hemodynamics, patient is discharged on carvedilol , losartan ,Aldactone and digoxin that he has been tolerating for last 24 hours. Discontinue isosorbide dinitrate. Outpatient cardiology follow-up.   Coronary artery disease with mild elevated troponins: Suspect demand ischemia. Resume atorvastatin and Zetia.  On Eliquis. Cardiology recommended Plavix in addition to Eliquis but no aspirin.   Permanent A. fib: Currently rate controlled.  Resumed carvedilol and digoxin.  Therapeutic on Eliquis.   AKI on CKD stage 2: Renal function normalized.   Type 2 diabetes with severe peripheral vascular disease: Right BKA, prosthesis fitted Left foot with superficial infection,earlier on antibiotics.  Now discontinued.  He does have extensive evidence of chronic ischemia.  Has vascular surgery follow-up as outpatient. On metformin at home that he can resume.   Hyponatremia: Hypochloremic hyponatremia with evidence of fluid overload.  Will correct with continuing diuresis.  Patient adequately stabilized.  He is on room air.  He is mobilized around with no evidence of drop in blood pressure.  As per cardiology plan, he will be going home today.    Discharge Diagnoses:  Principal Problem:   Acute on chronic combined systolic and diastolic CHF (congestive heart failure) (HCC) Active Problems:   Hypotension   Cardiogenic shock [facility])    Discharge Instructions  Discharge Instructions     Call MD for:  difficulty breathing, headache or visual disturbances   Complete by: As directed    Diet - low sodium heart healthy   Complete by: As directed  Diet Carb Modified   Complete by: As directed    Discharge wound care:   Complete by:  As directed    Keep your leg and feet clean , wash frequently and avoid injury   Increase activity slowly   Complete by: As directed       Allergies as of 06/19/2021       Reactions   Dulaglutide Anaphylaxis, Diarrhea, Hives   Ace Inhibitors    hypotension   Other Itching   Skin itching associated with nitro patch   Prednisone Rash        Medication List     STOP taking these medications    aspirin 81 MG EC tablet   isosorbide mononitrate 30 MG 24 hr tablet Commonly known as: IMDUR   metolazone 2.5 MG tablet Commonly known as: ZAROXOLYN       TAKE these medications    albuterol 108 (90 Base) MCG/ACT inhaler Commonly known as: VENTOLIN HFA Inhale into the lungs every 6 (six) hours as needed for wheezing or shortness of breath.   apixaban 5 MG Tabs tablet Commonly known as: ELIQUIS Take 5 mg by mouth 2 (two) times daily.   atorvastatin 80 MG tablet Commonly known as: LIPITOR TAKE 1 TABLET BY MOUTH ONCE DAILY   carvedilol 3.125 MG tablet Commonly known as: COREG TAKE 1 TABLET BY MOUTH TWICE DAILY WITH MEALS   clopidogrel 75 MG tablet Commonly known as: PLAVIX TAKE 1 TABLET BY MOUTH ONCE DAILY   digoxin 0.125 MG tablet Commonly known as: LANOXIN TAKE 1 TABLET BY MOUTH ONCE DAILY   DULoxetine 60 MG capsule Commonly known as: CYMBALTA Take 60 mg by mouth daily.   ezetimibe 10 MG tablet Commonly known as: ZETIA Take 1 tablet (10 mg total) by mouth daily.   Fluticasone-Umeclidin-Vilant 100-62.5-25 MCG/INH Aepb Inhale 1 puff into the lungs daily.   gabapentin 800 MG tablet Commonly known as: NEURONTIN Take 800 mg by mouth 4 (four) times daily.   ipratropium-albuterol 0.5-2.5 (3) MG/3ML Soln Commonly known as: DUONEB Inhale 3 mLs into the lungs every 6 (six) hours as needed (wheezing/sob).   losartan 25 MG tablet Commonly known as: COZAAR Take 0.5 tablets (12.5 mg total) by mouth daily.   metFORMIN 500 MG 24 hr tablet Commonly known as:  GLUCOPHAGE-XR Take 1,000 mg by mouth 2 (two) times daily.   Multi-Vitamins Tabs Take 1 tablet by mouth daily.   nitroGLYCERIN 0.4 MG SL tablet Commonly known as: NITROSTAT Place 1 tablet (0.4 mg total) under the tongue every 5 (five) minutes as needed for chest pain.   omeprazole 40 MG capsule Commonly known as: PRILOSEC Take 40 mg by mouth daily.   Oxycodone HCl 10 MG Tabs Take 10 mg by mouth every 4 (four) hours as needed (pain).   polyethylene glycol 17 g packet Commonly known as: MIRALAX / GLYCOLAX Take 17 g by mouth daily as needed for mild constipation.   potassium chloride 10 MEQ tablet Commonly known as: KLOR-CON TAKE 1 TABLET BY MOUTH TWICE DAILY   promethazine 25 MG tablet Commonly known as: PHENERGAN Take 25 mg by mouth every 8 (eight) hours as needed.   spironolactone 25 MG tablet Commonly known as: ALDACTONE Take 0.5 tablets (12.5 mg total) by mouth daily.   tamsulosin 0.4 MG Caps capsule Commonly known as: FLOMAX Take 1 capsule (0.4 mg total) by mouth daily after supper.   Torsemide 40 MG Tabs Take 40 mg by mouth 2 (two) times daily. Take 40 mg  by mouth twice daily.   traZODone 150 MG tablet Commonly known as: DESYREL Take 150 mg by mouth at bedtime.   Verquvo 10 MG Tabs Generic drug: Vericiguat TAKE 1 TABLET BY MOUTH ONCE DAILY               Discharge Care Instructions  (From admission, onward)           Start     Ordered   06/19/21 0000  Discharge wound care:       Comments: Keep your leg and feet clean , wash frequently and avoid injury   06/19/21 1047            Follow-up Information     [name], MD Follow up in 2 week(s).   Specialty: Family Medicine Contact information: [address] [place] [address] [phone]         Minna Merritts, MD .   Specialty: Cardiology Contact information: [place] [address] [phone]         Kris Hartmann, NP Follow up.   Specialty: Vascular Surgery Contact information: [place] [address] [phone]                Allergies  Allergen Reactions   Dulaglutide Anaphylaxis, Diarrhea and Hives   Ace Inhibitors     hypotension   Other Itching    Skin itching associated with nitro patch   Prednisone Rash    Consultations: Cardiology   Procedures/Studies: DG Chest 2 View  Result Date: 06/14/2021 CLINICAL DATA:  COPD exacerbation. EXAM: CHEST - 2 VIEW COMPARISON:  05/27/2021 FINDINGS: Chronic cardiomegaly, unchanged. Unchanged mediastinal contours with aortic atherosclerosis. Similar hyperinflation and peribronchial thickening. No focal airspace disease, pleural effusion, or pneumothorax. Minor thoracic spondylosis. Chronic widening of the right acromioclavicular joint. IMPRESSION: 1. No acute abnormality. 2. Stable cardiomegaly. 3. Chronic hyperinflation and peribronchial thickening consistent with provided history of COPD. Electronically Signed   By: Keith Rake M.D.   On: 06/14/2021 19:29   DG Chest 2 View  Result Date: 05/27/2021 CLINICAL DATA:  Fall, heart elbow EXAM: CHEST - 2 VIEW COMPARISON:  CT examination dated March 25, 2021. FINDINGS: The heart is enlarged. Coronary artery atherosclerotic calcifications. Lungs are clear without focal consolidation or pleural effusion. No acute osseous abnormality. IMPRESSION: Cardiomegaly without evidence of acute cardiopulmonary process. Electronically Signed   By: Keane Police D.O.   On: 05/27/2021 17:29   DG Tibia/Fibula Left  Result Date: 06/14/2021 CLINICAL DATA:  Diabetic wound infection. EXAM: LEFT TIBIA AND FIBULA - 2 VIEW COMPARISON:  None. FINDINGS: There is no evidence of fracture or other focal bone lesions. Vascular calcifications are noted. IMPRESSION: Normal left tibia and fibula. Electronically Signed   By: Marijo Conception M.D.   On: 06/14/2021 16:49   CT Head Wo Contrast  Result Date:  05/27/2021 CLINICAL DATA:  Fall. EXAM: CT HEAD WITHOUT CONTRAST CT CERVICAL SPINE WITHOUT CONTRAST TECHNIQUE: Multidetector CT imaging of the head and cervical spine was performed following the standard protocol without intravenous contrast. Multiplanar CT image reconstructions of the cervical spine were also generated. COMPARISON:  03/25/2021 FINDINGS: CT HEAD FINDINGS Brain: No evidence of acute infarction, hemorrhage, hydrocephalus, extra-axial collection or mass lesion/mass effect. Vascular: No hyperdense vessel or unexpected calcification. Skull: Normal. Negative for fracture or focal lesion. Sinuses/Orbits: Globes and orbits are unremarkable. Sinuses are clear. Other: None. CT CERVICAL SPINE FINDINGS Alignment: Normal. Skull base  and vertebrae: No acute fracture. No primary bone lesion or focal pathologic process. Soft tissues and spinal canal: No prevertebral fluid or swelling. No visible canal hematoma. Disc levels: Discs are relatively well maintained in height. No significant disc bulging. No evidence of a disc herniation. No stenosis. Upper chest: No acute findings. Significant aortic atherosclerotic calcifications. Lung apices are clear. Other: None. IMPRESSION: HEAD CT 1. No acute intracranial abnormalities. CERVICAL CT 1. No fracture or acute finding. Electronically Signed   By: Lajean Manes M.D.   On: 05/27/2021 17:01   CT Cervical Spine Wo Contrast  Result Date: 05/27/2021 CLINICAL DATA:  Fall. EXAM: CT HEAD WITHOUT CONTRAST CT CERVICAL SPINE WITHOUT CONTRAST TECHNIQUE: Multidetector CT imaging of the head and cervical spine was performed following the standard protocol without intravenous contrast. Multiplanar CT image reconstructions of the cervical spine were also generated. COMPARISON:  03/25/2021 FINDINGS: CT HEAD FINDINGS Brain: No evidence of acute infarction, hemorrhage, hydrocephalus, extra-axial collection or mass lesion/mass effect. Vascular: No hyperdense vessel or unexpected  calcification. Skull: Normal. Negative for fracture or focal lesion. Sinuses/Orbits: Globes and orbits are unremarkable. Sinuses are clear. Other: None. CT CERVICAL SPINE FINDINGS Alignment: Normal. Skull base and vertebrae: No acute fracture. No primary bone lesion or focal pathologic process. Soft tissues and spinal canal: No prevertebral fluid or swelling. No visible canal hematoma. Disc levels: Discs are relatively well maintained in height. No significant disc bulging. No evidence of a disc herniation. No stenosis. Upper chest: No acute findings. Significant aortic atherosclerotic calcifications. Lung apices are clear. Other: None. IMPRESSION: HEAD CT 1. No acute intracranial abnormalities. CERVICAL CT 1. No fracture or acute finding. Electronically Signed   By: Lajean Manes M.D.   On: 05/27/2021 17:01   DG Foot Complete Left  Result Date: 06/14/2021 CLINICAL DATA:  Left foot wound. EXAM: LEFT FOOT - COMPLETE 3+ VIEW COMPARISON:  None. FINDINGS: Status post amputation of second toe. No fracture or dislocation is noted. Joint spaces are intact. No lytic destruction is seen to suggest osteomyelitis. Soft tissues are unremarkable. IMPRESSION: No acute abnormality seen. Electronically Signed   By: Marijo Conception M.D.   On: 06/14/2021 16:51   ECHOCARDIOGRAM COMPLETE  Result Date: 06/15/2021    ECHOCARDIOGRAM REPORT   Patient Name:   [name] [name] Date of Exam: 06/15/2021 Medical Rec #:  [id number]      Height:       67.0 in Accession #:    [id number]     Weight:       186.0 lb Date of Birth:  [birth year]       BSA:          1.961 m Patient Age:    30 years       BP:           102/82 mmHg Patient Gender: M              HR:           60 bpm. Exam Location:  [facility] Procedure: 2D Echo, Color Doppler, Cardiac Doppler and Intracardiac            Opacification Agent Indications:     I50.21 congestive heart failure-Acute Systolic; F57.32                  congestive heart failure-Acute Diastolic  History:         Patient  has prior history of Echocardiogram examinations, most  recent 03/26/2021. Ischemic Cardiomyopathy; Risk                  Factors:Hypertension, Dyslipidemia, Current Smoker and                  Diabetes.  Sonographer:     Charmayne Sheer Referring Phys:  [place] Diagnosing Phys: Ida Rogue MD  Sonographer Comments: Suboptimal apical window. IMPRESSIONS  1. Left ventricular ejection fraction, by estimation, is <20%. The left ventricle has severely decreased function. The left ventricle demonstrates global hypokinesis. The left ventricular internal cavity size was severely dilated. Left ventricular diastolic parameters are indeterminate.  2. Right ventricular systolic function is severely reduced. The right ventricular size is moderately enlarged. There is severely elevated pulmonary artery systolic pressure. The estimated right ventricular systolic pressure is 11.5 mmHg.  3. The mitral valve is normal in structure. No evidence of mitral valve regurgitation. No evidence of mitral stenosis.  4. The aortic valve is normal in structure. Aortic valve regurgitation is not visualized. Aortic valve sclerosis/calcification is present, without any evidence of aortic stenosis.  5. The inferior vena cava is dilated in size with <50% respiratory variability, suggesting right atrial pressure of 15 mmHg. FINDINGS  Left Ventricle: Left ventricular ejection fraction, by estimation, is <20%. The left ventricle has severely decreased function. The left ventricle demonstrates global hypokinesis. Definity contrast agent was given IV to delineate the left ventricular endocardial borders. The left ventricular internal cavity size was severely dilated. There is no left ventricular hypertrophy. Left ventricular diastolic parameters are indeterminate. Right Ventricle: The right ventricular size is moderately enlarged. No increase in right ventricular wall thickness. Right ventricular systolic function is  severely reduced. There is severely elevated pulmonary artery systolic pressure. The tricuspid regurgitant velocity is 3.84 m/s, and with an assumed right atrial pressure of 15 mmHg, the estimated right ventricular systolic pressure is 72.6 mmHg. Left Atrium: Left atrial size was normal in size. Right Atrium: Right atrial size was normal in size. Pericardium: There is no evidence of pericardial effusion. Mitral Valve: The mitral valve is normal in structure. No evidence of mitral valve regurgitation. No evidence of mitral valve stenosis. MV peak gradient, 5.9 mmHg. The mean mitral valve gradient is 2.0 mmHg. Tricuspid Valve: The tricuspid valve is normal in structure. Tricuspid valve regurgitation is mild . No evidence of tricuspid stenosis. Aortic Valve: The aortic valve is normal in structure. Aortic valve regurgitation is not visualized. Aortic valve sclerosis/calcification is present, without any evidence of aortic stenosis. Aortic valve mean gradient measures 7.0 mmHg. Aortic valve peak  gradient measures 12.5 mmHg. Aortic valve area, by VTI measures 1.97 cm. Pulmonic Valve: The pulmonic valve was normal in structure. Pulmonic valve regurgitation is not visualized. No evidence of pulmonic stenosis. Aorta: The aortic root is normal in size and structure. Venous: The pulmonary veins were not well visualized. The inferior vena cava is dilated in size with less than 50% respiratory variability, suggesting right atrial pressure of 15 mmHg. IAS/Shunts: No atrial level shunt detected by color flow Doppler.  LEFT VENTRICLE PLAX 2D LVIDd:         6.47 cm   Diastology LVIDs:         6.09 cm   LV e' lateral:   4.79 cm/s LV PW:         1.30 cm   LV E/e' lateral: 25.7 LV IVS:        0.86 cm LVOT diam:     2.30  cm LV SV:         66 LV SV Index:   33 LVOT Area:     4.15 cm  RIGHT VENTRICLE RV Basal diam:  4.81 cm LEFT ATRIUM             Index        RIGHT ATRIUM           Index LA diam:        5.10 cm 2.60 cm/m   RA Area:      25.20 cm LA Vol (A2C):   70.5 ml 35.95 ml/m  RA Volume:   88.40 ml  45.08 ml/m LA Vol (A4C):   85.3 ml 43.50 ml/m LA Biplane Vol: 79.2 ml 40.39 ml/m  AORTIC VALVE                     PULMONIC VALVE AV Area (Vmax):    2.22 cm      PV Vmax:       0.79 m/s AV Area (Vmean):   2.20 cm      PV Vmean:      56.600 cm/s AV Area (VTI):     1.97 cm      PV VTI:        0.130 m AV Vmax:           177.00 cm/s   PV Peak grad:  2.5 mmHg AV Vmean:          127.000 cm/s  PV Mean grad:  1.0 mmHg AV VTI:            0.334 m AV Peak Grad:      12.5 mmHg AV Mean Grad:      7.0 mmHg LVOT Vmax:         94.60 cm/s LVOT Vmean:        67.100 cm/s LVOT VTI:          0.158 m LVOT/AV VTI ratio: 0.47  AORTA Ao Root diam: 3.30 cm MITRAL VALVE                TRICUSPID VALVE MV Area (PHT): 4.97 cm     TR Peak grad:   59.0 mmHg MV Area VTI:   2.39 cm     TR Vmax:        384.00 cm/s MV Peak grad:  5.9 mmHg MV Mean grad:  2.0 mmHg     SHUNTS MV Vmax:       1.21 m/s     Systemic VTI:  0.16 m MV Vmean:      60.8 cm/s    Systemic Diam: 2.30 cm MV Decel Time: 153 msec MV E velocity: 123.00 cm/s Ida Rogue MD Electronically signed by Ida Rogue MD Signature Date/Time: 06/15/2021/2:14:28 PM    Final    (Echo, Carotid, EGD, Colonoscopy, ERCP)    Subjective: Patient seen and examined.  No overnight events.  Denies any complaints.  Eager to go home.  Wants to go home early in the morning.   Discharge Exam: Vitals:   06/19/21 0551 06/19/21 0747  BP: 109/83 (!) 120/106  Pulse: 78 95  Resp: 19   Temp: 97.6 F (36.4 C) 97.7 F (36.5 C)  SpO2: 98% 98%   Vitals:   06/18/21 1955 06/19/21 0100 06/19/21 0551 06/19/21 0747  BP: 119/76 105/65 109/83 (!) 120/106  Pulse: (!) 59 67 78 95  Resp: 14 18 19    Temp: 97.7 F (36.5 C) (!) 97.4  F (36.3 C) 97.6 F (36.4 C) 97.7 F (36.5 C)  TempSrc: Oral Oral Oral   SpO2: 96% 98% 98% 98%  Weight:      Height:        General: Pt is alert, awake, not in acute distress On room  air. Cardiovascular: Irregularly irregular, S1/S2 +, no rubs, no gallops Respiratory: CTA bilaterally, no wheezing, no rhonchi Abdominal: Soft, NT, ND, bowel sounds + Extremities:  Right leg with below-knee amputation stump clean and dry. Left leg with shiny skin, chronic ischemic changes with loss of hair, 1+ pitting edema and some excoriation.    The results of significant diagnostics from this hospitalization (including imaging, microbiology, ancillary and laboratory) are listed below for reference.     Microbiology: Recent Results (from the past 240 hour(s))  Resp Panel by RT-PCR (Flu A&B, Covid) Nasopharyngeal Swab     Status: None   Collection Time: 06/14/21  7:07 PM   Specimen: Nasopharyngeal Swab; Nasopharyngeal(NP) swabs in vial transport medium  Result Value Ref Range Status   SARS Coronavirus 2 by RT PCR NEGATIVE NEGATIVE Final    Comment: (NOTE) SARS-CoV-2 target nucleic acids are NOT DETECTED.  The SARS-CoV-2 RNA is generally detectable in upper respiratory specimens during the acute phase of infection. The lowest concentration of SARS-CoV-2 viral copies this assay can detect is 138 copies/mL. A negative result does not preclude SARS-Cov-2 infection and should not be used as the sole basis for treatment or other patient management decisions. A negative result may occur with  improper specimen collection/handling, submission of specimen other than nasopharyngeal swab, presence of viral mutation(s) within the areas targeted by this assay, and inadequate number of viral copies(<138 copies/mL). A negative result must be combined with clinical observations, patient history, and epidemiological information. The expected result is Negative.  Fact Sheet for Patients:  EntrepreneurPulse.com.au  Fact Sheet for Healthcare Providers:  IncredibleEmployment.be  This test is no t yet approved or cleared by the [place] FDA and  has been  authorized for detection and/or diagnosis of SARS-CoV-2 by FDA under an Emergency Use Authorization (EUA). This EUA will remain  in effect (meaning this test can be used) for the duration of the COVID-19 declaration under Section 564(b)(1) of the Act, 21 U.S.C.section 360bbb-3(b)(1), unless the authorization is terminated  or revoked sooner.       Influenza A by PCR NEGATIVE NEGATIVE Final   Influenza B by PCR NEGATIVE NEGATIVE Final    Comment: (NOTE) The Xpert Xpress SARS-CoV-2/FLU/RSV plus assay is intended as an aid in the diagnosis of influenza from Nasopharyngeal swab specimens and should not be used as a sole basis for treatment. Nasal washings and aspirates are unacceptable for Xpert Xpress SARS-CoV-2/FLU/RSV testing.  Fact Sheet for Patients: EntrepreneurPulse.com.au  Fact Sheet for Healthcare Providers: IncredibleEmployment.be  This test is not yet approved or cleared by the [place] FDA and has been authorized for detection and/or diagnosis of SARS-CoV-2 by FDA under an Emergency Use Authorization (EUA). This EUA will remain in effect (meaning this test can be used) for the duration of the COVID-19 declaration under Section 564(b)(1) of the Act, 21 U.S.C. section 360bbb-3(b)(1), unless the authorization is terminated or revoked.  Performed at [facility], [address] [place], [place] [address]   Blood culture (single)     Status: None   Collection Time: 06/14/21  8:00 PM   Specimen: BLOOD  Result Value Ref Range Status   Specimen Description BLOOD BLOOD RIGHT HAND  Final  Special Requests   Final    BOTTLES DRAWN AEROBIC AND ANAEROBIC Blood Culture adequate volume   Culture   Final    NO GROWTH 5 DAYS Performed at [facility], [address] [place], [place] [address]    Report Status 06/19/2021 FINAL  Final  Respiratory (~20 pathogens) panel by PCR     Status: None   Collection Time:  06/15/21  7:28 PM   Specimen: Nasopharyngeal Swab; Respiratory  Result Value Ref Range Status   Adenovirus NOT DETECTED NOT DETECTED Final   Coronavirus 229E NOT DETECTED NOT DETECTED Final    Comment: (NOTE) The Coronavirus on the Respiratory Panel, DOES NOT test for the novel  Coronavirus (2019 nCoV)    Coronavirus HKU1 NOT DETECTED NOT DETECTED Final   Coronavirus NL63 NOT DETECTED NOT DETECTED Final   Coronavirus OC43 NOT DETECTED NOT DETECTED Final   Metapneumovirus NOT DETECTED NOT DETECTED Final   Rhinovirus / Enterovirus NOT DETECTED NOT DETECTED Final   Influenza A NOT DETECTED NOT DETECTED Final   Influenza B NOT DETECTED NOT DETECTED Final   Parainfluenza Virus 1 NOT DETECTED NOT DETECTED Final   Parainfluenza Virus 2 NOT DETECTED NOT DETECTED Final   Parainfluenza Virus 3 NOT DETECTED NOT DETECTED Final   Parainfluenza Virus 4 NOT DETECTED NOT DETECTED Final   Respiratory Syncytial Virus NOT DETECTED NOT DETECTED Final   Bordetella pertussis NOT DETECTED NOT DETECTED Final   Bordetella Parapertussis NOT DETECTED NOT DETECTED Final   Chlamydophila pneumoniae NOT DETECTED NOT DETECTED Final   Mycoplasma pneumoniae NOT DETECTED NOT DETECTED Final    Comment: Performed at [facility], [place]. [address] [place],  [address]     Labs: BNP (last 3 results) Recent Labs    03/25/21 2005 04/05/21 1405 06/14/21 1908  BNP 1,115.4* 1,053.7* 5,427.0*   Basic Metabolic Panel: Recent Labs  Lab 06/15/21 0347 06/16/21 0241 06/16/21 1526 06/16/21 1940 06/17/21 0556 06/17/21 0643 06/18/21 0723 06/19/21 0601  NA 132* 127*   < > 129* 127* 130* 127* 128*  K 4.8 4.8  --   --  4.3  --  4.4 4.7  CL 98 93*  --   --  91*  --  92* 92*  CO2 25 27  --   --  27  --  26 29  GLUCOSE 248* 138*  --   --  89  --  131* 110*  BUN 29* 32*  --   --  30*  --  31* 29*  CREATININE 1.24 1.26*  --   --  0.99  --  0.97 1.13  CALCIUM 8.5* 8.8*  --   --  9.0  --  8.8* 9.0  MG 1.4* 2.1   --   --  1.9  --   --  2.0  PHOS 4.4 2.7  --   --  3.8  --   --  3.4   < > = values in this interval not displayed.   Liver Function Tests: Recent Labs  Lab 06/14/21 1557  AST 20  ALT 20  ALKPHOS 93  BILITOT 2.1*  PROT 7.6  ALBUMIN 3.7   No results for input(s): LIPASE, AMYLASE in the last 168 hours. No results for input(s): AMMONIA in the last 168 hours. CBC: Recent Labs  Lab 06/14/21 1557 06/15/21 0347 06/16/21 0241 06/17/21 0556  WBC 5.6 4.3 7.2 6.0  NEUTROABS 4.1  --   --   --   HGB 10.3* 10.2*  10.1* 9.7*  HCT 31.4* 31.2* 31.0* 29.8*  MCV 82.8 82.3 82.2 82.1  PLT 173 183 163 181   Cardiac Enzymes: No results for input(s): CKTOTAL, CKMB, CKMBINDEX, TROPONINI in the last 168 hours. BNP: Invalid input(s): POCBNP CBG: Recent Labs  Lab 06/18/21 0749 06/18/21 1204 06/18/21 1713 06/18/21 1948 06/19/21 0749  GLUCAP 131* 120* 115* 167* 136*   D-Dimer No results for input(s): DDIMER in the last 72 hours. Hgb A1c Recent Labs    06/17/21 0556  HGBA1C 6.1*   Lipid Profile No results for input(s): CHOL, HDL, LDLCALC, TRIG, CHOLHDL, LDLDIRECT in the last 72 hours. Thyroid function studies No results for input(s): TSH, T4TOTAL, T3FREE, THYROIDAB in the last 72 hours.  Invalid input(s): FREET3 Anemia work up No results for input(s): VITAMINB12, FOLATE, FERRITIN, TIBC, IRON, RETICCTPCT in the last 72 hours. Urinalysis    Component Value Date/Time   COLORURINE STRAW (A) 06/14/2021 2220   APPEARANCEUR CLEAR (A) 06/14/2021 2220   APPEARANCEUR Clear 02/06/2014 1412   LABSPEC 1.006 06/14/2021 2220   LABSPEC 1.015 02/06/2014 1412   PHURINE 5.0 06/14/2021 2220   GLUCOSEU NEGATIVE 06/14/2021 2220   GLUCOSEU Negative 02/06/2014 1412   HGBUR NEGATIVE 06/14/2021 2220   BILIRUBINUR NEGATIVE 06/14/2021 2220   BILIRUBINUR Negative 02/06/2014 1412   KETONESUR NEGATIVE 06/14/2021 2220   PROTEINUR NEGATIVE 06/14/2021 2220   NITRITE NEGATIVE 06/14/2021 2220    LEUKOCYTESUR NEGATIVE 06/14/2021 2220   LEUKOCYTESUR Negative 02/06/2014 1412   Sepsis Labs Invalid input(s): PROCALCITONIN,  WBC,  LACTICIDVEN Microbiology Recent Results (from the past 240 hour(s))  Resp Panel by RT-PCR (Flu A&B, Covid) Nasopharyngeal Swab     Status: None   Collection Time: 06/14/21  7:07 PM   Specimen: Nasopharyngeal Swab; Nasopharyngeal(NP) swabs in vial transport medium  Result Value Ref Range Status   SARS Coronavirus 2 by RT PCR NEGATIVE NEGATIVE Final    Comment: (NOTE) SARS-CoV-2 target nucleic acids are NOT DETECTED.  The SARS-CoV-2 RNA is generally detectable in upper respiratory specimens during the acute phase of infection. The lowest concentration of SARS-CoV-2 viral copies this assay can detect is 138 copies/mL. A negative result does not preclude SARS-Cov-2 infection and should not be used as the sole basis for treatment or other patient management decisions. A negative result may occur with  improper specimen collection/handling, submission of specimen other than nasopharyngeal swab, presence of viral mutation(s) within the areas targeted by this assay, and inadequate number of viral copies(<138 copies/mL). A negative result must be combined with clinical observations, patient history, and epidemiological information. The expected result is Negative.  Fact Sheet for Patients:  EntrepreneurPulse.com.au  Fact Sheet for Healthcare Providers:  IncredibleEmployment.be  This test is no t yet approved or cleared by the [place] FDA and  has been authorized for detection and/or diagnosis of SARS-CoV-2 by FDA under an Emergency Use Authorization (EUA). This EUA will remain  in effect (meaning this test can be used) for the duration of the COVID-19 declaration under Section 564(b)(1) of the Act, 21 U.S.C.section 360bbb-3(b)(1), unless the authorization is terminated  or revoked sooner.       Influenza A by  PCR NEGATIVE NEGATIVE Final   Influenza B by PCR NEGATIVE NEGATIVE Final    Comment: (NOTE) The Xpert Xpress SARS-CoV-2/FLU/RSV plus assay is intended as an aid in the diagnosis of influenza from Nasopharyngeal swab specimens and should not be used as a sole basis for treatment. Nasal washings and aspirates are unacceptable for Xpert Xpress SARS-CoV-2/FLU/RSV testing.  Fact Sheet for Patients: EntrepreneurPulse.com.au  Fact Sheet for Healthcare Providers: IncredibleEmployment.be  This test is not yet approved or cleared by the [place] FDA and has been authorized for detection and/or diagnosis of SARS-CoV-2 by FDA under an Emergency Use Authorization (EUA). This EUA will remain in effect (meaning this test can be used) for the duration of the COVID-19 declaration under Section 564(b)(1) of the Act, 21 U.S.C. section 360bbb-3(b)(1), unless the authorization is terminated or revoked.  Performed at [facility], [address] [place], [place] [address]   Blood culture (single)     Status: None   Collection Time: 06/14/21  8:00 PM   Specimen: BLOOD  Result Value Ref Range Status   Specimen Description BLOOD BLOOD RIGHT HAND  Final   Special Requests   Final    BOTTLES DRAWN AEROBIC AND ANAEROBIC Blood Culture adequate volume   Culture   Final    NO GROWTH 5 DAYS Performed at [facility], [address] [place], [place] [address]    Report Status 06/19/2021 FINAL  Final  Respiratory (~20 pathogens) panel by PCR     Status: None   Collection Time: 06/15/21  7:28 PM   Specimen: Nasopharyngeal Swab; Respiratory  Result Value Ref Range Status   Adenovirus NOT DETECTED NOT DETECTED Final   Coronavirus 229E NOT DETECTED NOT DETECTED Final    Comment: (NOTE) The Coronavirus on the Respiratory Panel, DOES NOT test for the novel  Coronavirus (2019 nCoV)    Coronavirus HKU1 NOT DETECTED NOT DETECTED Final   Coronavirus  NL63 NOT DETECTED NOT DETECTED Final   Coronavirus OC43 NOT DETECTED NOT DETECTED Final   Metapneumovirus NOT DETECTED NOT DETECTED Final   Rhinovirus / Enterovirus NOT DETECTED NOT DETECTED Final   Influenza A NOT DETECTED NOT DETECTED Final   Influenza B NOT DETECTED NOT DETECTED Final   Parainfluenza Virus 1 NOT DETECTED NOT DETECTED Final   Parainfluenza Virus 2 NOT DETECTED NOT DETECTED Final   Parainfluenza Virus 3 NOT DETECTED NOT DETECTED Final   Parainfluenza Virus 4 NOT DETECTED NOT DETECTED Final   Respiratory Syncytial Virus NOT DETECTED NOT DETECTED Final   Bordetella pertussis NOT DETECTED NOT DETECTED Final   Bordetella Parapertussis NOT DETECTED NOT DETECTED Final   Chlamydophila pneumoniae NOT DETECTED NOT DETECTED Final   Mycoplasma pneumoniae NOT DETECTED NOT DETECTED Final    Comment: Performed at [facility], [place] [address], [place], [place] [address]     Time coordinating discharge: 45 minutes  SIGNED:   Barb Merino, MD  [facility] 06/19/2021, 10:47 AM

## 2021-06-19 NOTE — Progress Notes (Addendum)
Progress Note  Patient Name: [name] [name] Date of Encounter: 06/19/2021  [facility] Cardiologist: Ida Rogue, MD   Subjective   No chest pain. Dyspnea and left lower extremity much swelling improved. Documented UOP 1.1 L for the admission, he reports vigorous UOP. SCr starting to trend upwards.   Inpatient Medications    Scheduled Meds:  apixaban  5 mg Oral BID   atorvastatin  80 mg Oral Daily   carvedilol  3.125 mg Oral BID WC   digoxin  125 mcg Oral Daily   DULoxetine  60 mg Oral Daily   ezetimibe  10 mg Oral Daily   furosemide  20 mg Intravenous Q12H   gabapentin  800 mg Oral QID   insulin aspart  0-5 Units Subcutaneous QHS   insulin aspart  0-9 Units Subcutaneous TID WC   losartan  12.5 mg Oral Daily   spironolactone  12.5 mg Oral Daily   tamsulosin  0.4 mg Oral QPC supper   Continuous Infusions:   PRN Meds: albuterol, docusate sodium, morphine injection, nitroGLYCERIN, oxyCODONE, polyethylene glycol   Vital Signs    Vitals:   06/18/21 1955 06/19/21 0100 06/19/21 0551 06/19/21 0747  BP: 119/76 105/65 109/83 (!) 120/106  Pulse: (!) 59 67 78 95  Resp: 14 18 19    Temp: 97.7 F (36.5 C) (!) 97.4 F (36.3 C) 97.6 F (36.4 C) 97.7 F (36.5 C)  TempSrc: Oral Oral Oral   SpO2: 96% 98% 98% 98%  Weight:      Height:        Intake/Output Summary (Last 24 hours) at 06/19/2021 0909 Last data filed at 06/18/2021 1827 Gross per 24 hour  Intake 524.17 ml  Output 525 ml  Net -0.83 ml    Last 3 Weights 06/18/2021 06/14/2021 05/27/2021  Weight (lbs) 182 lb 15.7 oz 186 lb 160 lb  Weight (kg) 83 kg 84.369 kg 72.576 kg  Some encounter information is confidential and restricted. Go to Review Flowsheets activity to see all data.      Telemetry    Atrial fibrillation with PVCs - personally reviewed  ECG    Afib, 80 bpm, RBBB (known), PVC, no significant changes when compared to admission  Physical Exam   GEN: Well nourished, well developed, in no acute  distress  HEENT: normal  Neck: no JVD, carotid bruits, or masses Cardiac: irregular; no murmurs, rubs, or gallops,no edema  Respiratory: Clear to auscultation bilaterally   GI: soft, nontender, nondistended, + BS MS: Improved left lower extremity swelling. No stump swelling of the right lower extremity  Skin: warm and dry Neuro:  Strength and sensation are intact Psych: euthymic mood, full affect   Labs    High Sensitivity Troponin:   Recent Labs  Lab 06/14/21 1908 06/14/21 2052 06/15/21 1044  TROPONINIHS 341* 352* 182*      Chemistry Recent Labs  Lab 06/14/21 1557 06/15/21 0347 06/16/21 0241 06/16/21 1526 06/17/21 0556 06/17/21 0643 06/18/21 0723 06/19/21 0601  NA 131*   < > 127*   < > 127* 130* 127* 128*  K 4.5   < > 4.8  --  4.3  --  4.4 4.7  CL 95*   < > 93*  --  91*  --  92* 92*  CO2 30   < > 27  --  27  --  26 29  GLUCOSE 107*   < > 138*  --  89  --  131* 110*  BUN 28*   < >  32*  --  30*  --  31* 29*  CREATININE 1.33*   < > 1.26*  --  0.99  --  0.97 1.13  CALCIUM 8.9   < > 8.8*  --  9.0  --  8.8* 9.0  MG  --    < > 2.1  --  1.9  --   --  2.0  PROT 7.6  --   --   --   --   --   --   --   ALBUMIN 3.7  --   --   --   --   --   --   --   AST 20  --   --   --   --   --   --   --   ALT 20  --   --   --   --   --   --   --   ALKPHOS 93  --   --   --   --   --   --   --   BILITOT 2.1*  --   --   --   --   --   --   --   GFRNONAA >60   < > >60  --  >60  --  >60 >60  ANIONGAP 6   < > 7  --  9  --  9 7   < > = values in this interval not displayed.     Lipids No results for input(s): CHOL, TRIG, HDL, LABVLDL, LDLCALC, CHOLHDL in the last 168 hours.  Hematology Recent Labs  Lab 06/15/21 0347 06/16/21 0241 06/17/21 0556  WBC 4.3 7.2 6.0  RBC 3.79* 3.77* 3.63*  HGB 10.2* 10.1* 9.7*  HCT 31.2* 31.0* 29.8*  MCV 82.3 82.2 82.1  MCH 26.9 26.8 26.7  MCHC 32.7 32.6 32.6  RDW 22.0* 21.9* 21.7*  PLT 183 163 181    Thyroid  Recent Labs  Lab 06/14/21 2052  TSH  2.617     BNP Recent Labs  Lab 06/14/21 1908  BNP 2,270.8*     DDimer No results for input(s): DDIMER in the last 168 hours.   Radiology    No results found.  Cardiac Studies   2D echo 06/15/2021: 1. Left ventricular ejection fraction, by estimation, is <20%. The left  ventricle has severely decreased function. The left ventricle demonstrates  global hypokinesis. The left ventricular internal cavity size was severely  dilated. Left ventricular  diastolic parameters are indeterminate.   2. Right ventricular systolic function is severely reduced. The right  ventricular size is moderately enlarged. There is severely elevated  pulmonary artery systolic pressure. The estimated right ventricular  systolic pressure is 87.5 mmHg.   3. The mitral valve is normal in structure. No evidence of mitral valve  regurgitation. No evidence of mitral stenosis.   4. The aortic valve is normal in structure. Aortic valve regurgitation is  not visualized. Aortic valve sclerosis/calcification is present, without  any evidence of aortic stenosis.   5. The inferior vena cava is dilated in size with <50% respiratory  variability, suggesting right atrial pressure of 15 mmHg. ___________  2D echo 03/26/2021: 1. Left ventricular ejection fraction, by estimation, is 20 to 25%. The  left ventricle has severely decreased function. The left ventricle  demonstrates global hypokinesis. The left ventricular internal cavity size  was severely dilated. Left ventricular  diastolic function could not be evaluated.   2. Right ventricular systolic  function is severely reduced. The right  ventricular size is mildly enlarged. There is severely elevated pulmonary  artery systolic pressure.   3. Left atrial size was mildly dilated.   4. Right atrial size was severely dilated.   5. The mitral valve is abnormal. Moderate mitral valve regurgitation.   6. Tricuspid valve regurgitation is moderate to severe.   7. The  aortic valve has an indeterminant number of cusps. There is  moderate calcification of the aortic valve. There is moderate thickening  of the aortic valve. Aortic valve regurgitation is not visualized. There  appears to be mild aortic stenosis, with   LVOT gradient underestimated due to low LVEF.   8. The inferior vena cava is normal in size with <50% respiratory  variability, suggesting right atrial pressure of 8 mmHg. __________   2D echo 06/17/2020:  1. Left ventricular ejection fraction, by estimation, is <20%. Left  ventricular ejection fraction by PLAX is 14 %. The left ventricle has  severely decreased function. The left ventricle demonstrates global  hypokinesis. The left ventricular internal  cavity size was severely dilated. Left ventricular diastolic parameters  are indeterminate.   2. Right ventricular systolic function is severely reduced. The right  ventricular size is moderately enlarged. There is severely elevated  pulmonary artery systolic pressure. The estimated right ventricular  systolic pressure is 50.2 mmHg.   3. Left atrial size was severely dilated.   4. Right atrial size was severely dilated.   5. Tricuspid valve regurgitation is mild to moderate.   6. The inferior vena cava is dilated in size with <50% respiratory  variability, suggesting right atrial pressure of 15 mmHg. __________   LHC 01/20/2020: Prox RCA-1 lesion is 80% stenosed. Prox RCA-2 lesion is 20% stenosed. Prox RCA to Mid RCA lesion is 80% stenosed. Prox LAD lesion is 20% stenosed. 1st Diag lesion is 80% stenosed. Mid LAD lesion is 20% stenosed. Dist LAD lesion is 60% stenosed. Prox Cx to Mid Cx lesion is 90% stenosed. LPAV lesion is 95% stenosed.   1. Significant underlying three-vessel coronary artery disease. Patent proximal LAD stent with mild in-stent restenosis. Moderate diffuse disease in the distal LAD. Significant stenosis in the proximal left circumflex at the origin of the posterior  AV groove artery which is also heavily diseased at the ostium. This is a bifurcation lesion and heavily calcified. RCA stent is patent. However, there is significant proximal disease in the whole mid to distal segment is diffusely diseased and small caliber. 2. Left ventricular angiography was not performed. EF was severe reduced by echo. 3. Moderately elevated left ventricular end-diastolic pressure at 28 mmHg   Recommendations: The patient's cardiomyopathy seems to be out of proportion to his coronary artery disease as the LAD itself does not seem to have obstructive disease at the present time. The RCA is diffusely diseased and too small to stent. The only target for revascularization would be the left circumflex but it is a calcified bifurcation lesion and likely requires atherectomy. In order to do this safely, the patient has to be optimized from a heart failure standpoint as he appears to be volume overloaded. Recommend intravenous diuresis. If limited by hypotension, we might need to consider inotropic support and small dose norepinephrine drip initially. I added small dose digoxin. Treat underlying atrial fibrillation. Resume heparin drip 8 hours after sheath pull. __________   2D echo 01/20/2020: 1. Left ventricular ejection fraction, by estimation, is <20%. The left  ventricle has severely decreased function. The  left ventricle demonstrates  global hypokinesis. The left ventricular internal cavity size was  moderately dilated. Left ventricular  diastolic parameters are indeterminate.   2. Right ventricular systolic function is moderately reduced. The right  ventricular size is moderately enlarged. There is normal pulmonary artery  systolic pressure. The estimated right ventricular systolic pressure is  05.3 mmHg.   3. Left atrial size was mildly dilated.   4. Mild mitral valve regurgitation.   5. Tricuspid valve regurgitation is moderate. __________   2D echo 09/28/2019: 1. Left  ventricular ejection fraction, by estimation, is 20 to 25%. The  left ventricle has severely decreased function. The left ventricle  demonstrates global hypokinesis. The left ventricular internal cavity size  was severely dilated. There is mild  left ventricular hypertrophy. Indeterminate diastolic filling due to E-A  fusion.   2. Right ventricular systolic function is moderately reduced. The right  ventricular size is mildly enlarged. There is moderately elevated  pulmonary artery systolic pressure. The estimated right ventricular  systolic pressure is 97.6 mmHg.   3. Left atrial size was mildly dilated.   4. Right atrial size was moderately dilated.   5. The mitral valve is degenerative. Mild mitral valve regurgitation. No  evidence of mitral stenosis.   6. Tricuspid valve regurgitation is mild to moderate.   7. The aortic valve was not well visualized. Aortic valve regurgitation  is not visualized. Mild aortic valve sclerosis is present, with no  evidence of aortic valve stenosis.   8. The inferior vena cava is dilated in size with <50% respiratory  variability, suggesting right atrial pressure of 15 mmHg. ___________   2D echo 11/06/2018: 1. The left ventricle has severely reduced systolic function, with an  ejection fraction of 20-25%. The cavity size was moderately dilated.  Indeterminate diastolic filling due to E-A fusion. Left ventricular  diffuse hypokinesis.   2. The right ventricle has severely reduced systolic function. The cavity  was mildly enlarged. There is no increase in right ventricular wall  thickness. Right ventricular systolic pressure is moderately elevated with  an estimated pressure of 59.6 mmHg.   3. Left atrial size was mildly dilated.   4. Right atrial size was mildly dilated.   5. The mitral valve is degenerative. Mild thickening of the mitral valve  leaflet. Mild calcification of the mitral valve leaflet. There is mild  mitral annular calcification  present.   6. The tricuspid valve is degenerative. Tricuspid valve regurgitation is  moderate.   7. The aortic valve was not well visualized. Moderate thickening of the  aortic valve. Moderate calcification of the aortic valve.   8. The inferior vena cava was dilated in size with <50% respiratory  variability. __________   LHC 04/2017: Prox RCA-1 lesion is 70% stenosed. Prox RCA-2 lesion is 20% stenosed. Mid RCA lesion is 80% stenosed. Mid RCA to Dist RCA lesion is 70% stenosed. Ost LAD to Prox LAD lesion is 30% stenosed. Ost 1st Diag to 1st Diag lesion is 80% stenosed. Prox LAD to Mid LAD lesion is 60% stenosed. Dist LAD lesion is 60% stenosed. Prox Cx to Mid Cx lesion is 90% stenosed. Ost 1st Mrg to 1st Mrg lesion is 85% stenosed.   1.  Significant three-vessel coronary artery disease with patent stent in the RCA and LAD.  There is significant proximal RCA disease before the stent as well as diffuse mid and distal disease in a vessel that appears to be about 2.5 mm in diameter.  There is significant  bifurcation stenosis in the proximal left circumflex with a large OM1.  The LAD has moderate disease. The coronary arteries are moderately calcified and diffusely diseased throughout. 2.  Left ventricular angiography was not performed.  EF was moderately to severely reduced by echo. 3.  Severely elevated left ventricular end-diastolic pressure at 34 mmHg.   Recommendations: This is overall a difficult situation.  Revascularization options are somewhat limited due to diffuse disease overall.  Given that he is diabetic and has cardiomyopathy, best option might be CABG if he is found to be a candidate.  We need to optimize his heart failure first with outpatient referral to cardiothoracic surgery for evaluation.  I am going to increase intravenous diuresis today and the patient possibly can be discharged home tomorrow on medical therapy.    Patient Profile     61 y.o. male with a hx of CAD  s/p prior PCI, chronic combined systolic and diastolic congestive heart failure, ICM, pulmonary hypertension, tobacco abuse, COPD, HTN, HLD, permanent atrial fibrillation, obesity, obstructive sleep apnea, type 2 diabetes mellitus, stage III chronic kidney disease, peripheral arterial disease status post right BKA, and carotid arterial disease, who is being seen today for the evaluation of SOB and chest tightness   Assessment & Plan   1.  Suspected cardiogenic shock: Stable.  Now off Levophed.    2.  Acute on chronic systolic heart failure with pulmonary hypertension: Volume status much improved.  I/O not accurate.  Transition from IV Lasix to torsemide 40 mg bid. Continue Coreg, digoxin, spironolactone, and losartan.  3.  Elevated troponin: No chest pain noted.  Likely due to demand ischemia in the setting of shock.  Continue Plavix, Lipitor, Zetia. No plans for inpatient ischemic evaluation.   4.  Permanent atrial fibrillation: Rates well controlled with low dose Coreg and digoxin.  CHA2DS2-VASc of 4.  PTA Eliquis.  5.  PAD/diabetes: Plan per internal medicine.     For questions or updates, please contact [facility] Please consult [email or web address] for contact info under        Signed, Christell Faith, PA-C  06/19/2021, 9:09 AM

## 2021-06-19 NOTE — Plan of Care (Signed)

## 2021-06-19 NOTE — Progress Notes (Signed)
°  Transition of Care [facility]) Screening Note   Patient Details  Name: [name] [name] Date of Birth: July 11, 1960   Transition of Care [facility]) CM/SW Contact:    Alberteen Sam, LCSW Phone Number: 06/19/2021, 12:00 PM  No needs per 1/7 note, no dc needs arose during hospital stay. Patient to discharge today.  Transition of Care Department [facility]) has reviewed patient and no TOC needs have been identified at this time. We will continue to monitor patient advancement through interdisciplinary progression rounds. If new patient transition needs arise, please place a TOC consult.

## 2021-06-22 ENCOUNTER — Telehealth: Payer: Self-pay

## 2021-06-22 NOTE — Telephone Encounter (Addendum)
Follow-up post discharge phone call.  Patient had been discharged from the hospital on January 10.  Spoke with his significant other [name], this patient was currently taking a nap.  They have a scale but he is not weighing himself daily.  Discussed daily weights after going to the bathroom and then recording.  Also to report 2 to 3 pound weight gain overnight or 5 pounds within the week.  She states that his appetite is better and he is eating better.  Is sticking with fluid restriction of approximately 50 ounces.  He drinks [facility] and half cup of coffee daily.  Discussed his medications.  He did discontinue the aspirin, the Zaroxolyn and the isosorbide.  Discussed checking blood pressure.  She states that they do not have a blood pressure cuff.  Informed her that I would supply her with 1.  In going over his medication list he was taking torsemide 40 in the morning and 20 at noon.  Instructed that he should be taking 40 in the morning and 40 at noon as per his discharge instructions. She voices understanding.  He has upcoming appointment on January 19 at 1:30 in the afternoon at the outpatient heart failure clinic.  She had no further questions.  Pricilla Riffle RN [facility]

## 2021-06-25 ENCOUNTER — Other Ambulatory Visit (INDEPENDENT_AMBULATORY_CARE_PROVIDER_SITE_OTHER): Payer: Self-pay | Admitting: Nurse Practitioner

## 2021-06-25 ENCOUNTER — Encounter (INDEPENDENT_AMBULATORY_CARE_PROVIDER_SITE_OTHER): Payer: Medicare Other

## 2021-06-25 ENCOUNTER — Ambulatory Visit (INDEPENDENT_AMBULATORY_CARE_PROVIDER_SITE_OTHER): Payer: Medicare Other | Admitting: Nurse Practitioner

## 2021-06-25 DIAGNOSIS — I739 Peripheral vascular disease, unspecified: Secondary | ICD-10-CM

## 2021-06-25 DIAGNOSIS — Z9889 Other specified postprocedural states: Secondary | ICD-10-CM

## 2021-06-27 ENCOUNTER — Telehealth (HOSPITAL_COMMUNITY): Payer: Self-pay

## 2021-06-27 NOTE — Telephone Encounter (Signed)
Contacted [name] [name] to see how he was and remind him of HF clinic appt tomorrow.  He states he will go to HF clinic tomorrow, he also told girlfriend about it.  He states feeling much better, he states legs are looking better.  He sounds in a good mood, he was laughing and cutting up.  He states has everything for daily living and not in need of anything.  Will make a home visit next week on Thursday at 24.  Will visit for heart failure, diet and medication management.   [place] [phone]

## 2021-06-28 ENCOUNTER — Ambulatory Visit: Payer: Commercial Managed Care - HMO | Admitting: Family

## 2021-07-05 ENCOUNTER — Telehealth: Payer: Self-pay | Admitting: Family

## 2021-07-05 NOTE — Telephone Encounter (Signed)
Called and verified patients appointment for tomorrow 1/27 and patient stated they are having trouble getting insurance to approve Verquvo. I called Pharmacy and they are waiting on prior authorization to fill medication.   Tayen Narang, NT

## 2021-07-06 ENCOUNTER — Ambulatory Visit
Admission: RE | Admit: 2021-07-06 | Discharge: 2021-07-06 | Disposition: A | Discharge status: Home / Self Care | Payer: Medicare Other | Source: Ambulatory Visit | Attending: Family | Admitting: Family

## 2021-07-06 ENCOUNTER — Encounter: Payer: Self-pay | Admitting: Family

## 2021-07-06 ENCOUNTER — Ambulatory Visit (HOSPITAL_BASED_OUTPATIENT_CLINIC_OR_DEPARTMENT_OTHER): Payer: Medicare Other | Admitting: Family

## 2021-07-06 ENCOUNTER — Other Ambulatory Visit: Payer: Self-pay

## 2021-07-06 ENCOUNTER — Other Ambulatory Visit: Payer: Self-pay | Admitting: Family

## 2021-07-06 VITALS — BP 112/76 | HR 93 | Resp 18 | Ht 62.0 in

## 2021-07-06 DIAGNOSIS — Z955 Presence of coronary angioplasty implant and graft: Secondary | ICD-10-CM | POA: Diagnosis not present

## 2021-07-06 DIAGNOSIS — M7989 Other specified soft tissue disorders: Secondary | ICD-10-CM | POA: Insufficient documentation

## 2021-07-06 DIAGNOSIS — E785 Hyperlipidemia, unspecified: Secondary | ICD-10-CM | POA: Insufficient documentation

## 2021-07-06 DIAGNOSIS — R140 Abdominal distension (gaseous): Secondary | ICD-10-CM | POA: Insufficient documentation

## 2021-07-06 DIAGNOSIS — R058 Other specified cough: Secondary | ICD-10-CM | POA: Insufficient documentation

## 2021-07-06 DIAGNOSIS — I5023 Acute on chronic systolic (congestive) heart failure: Secondary | ICD-10-CM | POA: Diagnosis present

## 2021-07-06 DIAGNOSIS — E875 Hyperkalemia: Secondary | ICD-10-CM | POA: Insufficient documentation

## 2021-07-06 DIAGNOSIS — Z89511 Acquired absence of right leg below knee: Secondary | ICD-10-CM | POA: Insufficient documentation

## 2021-07-06 DIAGNOSIS — E1151 Type 2 diabetes mellitus with diabetic peripheral angiopathy without gangrene: Secondary | ICD-10-CM | POA: Insufficient documentation

## 2021-07-06 DIAGNOSIS — G4733 Obstructive sleep apnea (adult) (pediatric): Secondary | ICD-10-CM | POA: Insufficient documentation

## 2021-07-06 DIAGNOSIS — I110 Hypertensive heart disease with heart failure: Secondary | ICD-10-CM | POA: Insufficient documentation

## 2021-07-06 DIAGNOSIS — J449 Chronic obstructive pulmonary disease, unspecified: Secondary | ICD-10-CM | POA: Insufficient documentation

## 2021-07-06 DIAGNOSIS — G8929 Other chronic pain: Secondary | ICD-10-CM | POA: Insufficient documentation

## 2021-07-06 DIAGNOSIS — I2510 Atherosclerotic heart disease of native coronary artery without angina pectoris: Secondary | ICD-10-CM | POA: Insufficient documentation

## 2021-07-06 DIAGNOSIS — E1159 Type 2 diabetes mellitus with other circulatory complications: Secondary | ICD-10-CM

## 2021-07-06 DIAGNOSIS — R42 Dizziness and giddiness: Secondary | ICD-10-CM | POA: Insufficient documentation

## 2021-07-06 DIAGNOSIS — I70203 Unspecified atherosclerosis of native arteries of extremities, bilateral legs: Secondary | ICD-10-CM | POA: Insufficient documentation

## 2021-07-06 DIAGNOSIS — I5043 Acute on chronic combined systolic (congestive) and diastolic (congestive) heart failure: Secondary | ICD-10-CM

## 2021-07-06 DIAGNOSIS — F17210 Nicotine dependence, cigarettes, uncomplicated: Secondary | ICD-10-CM | POA: Insufficient documentation

## 2021-07-06 DIAGNOSIS — I10 Essential (primary) hypertension: Secondary | ICD-10-CM | POA: Diagnosis not present

## 2021-07-06 DIAGNOSIS — I5042 Chronic combined systolic (congestive) and diastolic (congestive) heart failure: Secondary | ICD-10-CM | POA: Diagnosis not present

## 2021-07-06 DIAGNOSIS — R0602 Shortness of breath: Secondary | ICD-10-CM | POA: Insufficient documentation

## 2021-07-06 DIAGNOSIS — I70219 Atherosclerosis of native arteries of extremities with intermittent claudication, unspecified extremity: Secondary | ICD-10-CM

## 2021-07-06 DIAGNOSIS — Z720 Tobacco use: Secondary | ICD-10-CM

## 2021-07-06 LAB — BASIC METABOLIC PANEL
Anion gap: 9 (ref 5–15)
BUN: 29 mg/dL — ABNORMAL HIGH (ref 6–20)
CO2: 31 mmol/L (ref 22–32)
Calcium: 9.1 mg/dL (ref 8.9–10.3)
Chloride: 92 mmol/L — ABNORMAL LOW (ref 98–111)
Creatinine, Ser: 1.23 mg/dL (ref 0.61–1.24)
GFR, Estimated: >60 mL/min (ref >60)
Glucose, Bld: 125 mg/dL — ABNORMAL HIGH (ref 70–99)
Potassium: 4.8 mmol/L (ref 3.5–5.1)
Sodium: 132 mmol/L — ABNORMAL LOW (ref 135–145)

## 2021-07-06 LAB — BRAIN NATRIURETIC PEPTIDE: B Natriuretic Peptide: 1932.1 pg/mL — ABNORMAL HIGH (ref 0.0–100.0)

## 2021-07-06 MED ORDER — SODIUM CHLORIDE FLUSH 0.9 % IV SOLN
INTRAVENOUS | Status: AC
  Filled 2021-07-06: qty 10

## 2021-07-06 MED ORDER — FUROSEMIDE 10 MG/ML IJ SOLN: 80.0000 mg | Freq: Once | INTRAMUSCULAR | Status: AC

## 2021-07-06 MED ORDER — FUROSEMIDE 10 MG/ML IJ SOLN
INTRAMUSCULAR | Status: AC
  Administered 2021-07-06: 80 mg via INTRAVENOUS
  Filled 2021-07-06: qty 8

## 2021-07-06 MED ORDER — POTASSIUM CHLORIDE CRYS ER 20 MEQ PO TBCR: 40.0000 meq | EXTENDED_RELEASE_TABLET | Freq: Once | ORAL | Status: AC

## 2021-07-06 MED ORDER — POTASSIUM CHLORIDE CRYS ER 20 MEQ PO TBCR
EXTENDED_RELEASE_TABLET | ORAL | Status: AC
  Administered 2021-07-06: 40 meq via ORAL
  Filled 2021-07-06: qty 2

## 2021-07-06 NOTE — Progress Notes (Signed)
Patient ID: [name] [name], male    DOB: [birth year], 61 y.o.   MRN: [id number]  HPI  [name] [name] is a 61 y/o male with a history of obstructive sleep apnea, PVD, PAD, HTN, hyperlipidemia, DM, CAD, COPD, BPH, asthma, current tobacco use and chronic heart failure.   Echo report from 06/15/21 reviewed and showed an EF of <20% along with severely elevated PA pressure of 74.0 mmHg. Echo report from 03/26/21 reviewed and showed an EF of 20-25% along with severely elevated PA pressure and moderate [name]. Echo report from 06/17/20 reviewed and showed an EF of <20% along with elevated PA pressure of 53.2 mmHg and mild/ moderate TR. Echo report from 01/20/20 reviewed and showed an EF of <20% along with mild [name] and moderate TR. Echo report from 09/29/19 reviewed and showed an EF of 20-25% along with moderately elevated PA pressure, mild [name] and mild/moderate TR. Echo report from 11/06/2018 reviewed and showed an EF of 20-25% along with moderate TR and a PA pressure of 59.6 mmHg. Echo report from 04/25/17 reviewed and shows an EF of 25-30% along with a PA pressure of 34 mm Hg. EF has declined from 55-60% back in 2016.   LHC done 01/20/20 showed: Prox RCA-1 lesion is 80% stenosed. Prox RCA-2 lesion is 20% stenosed. Prox RCA to Mid RCA lesion is 80% stenosed. Prox LAD lesion is 20% stenosed. 1st Diag lesion is 80% stenosed. Mid LAD lesion is 20% stenosed. Dist LAD lesion is 60% stenosed. Prox Cx to Mid Cx lesion is 90% stenosed. LPAV lesion is 95% stenosed.   1. Significant underlying three-vessel coronary artery disease. Patent proximal LAD stent with mild in-stent restenosis. Moderate diffuse disease in the distal LAD. Significant stenosis in the proximal left circumflex at the origin of the posterior AV groove artery which is also heavily diseased at the ostium. This is a bifurcation lesion and heavily calcified. RCA stent is patent. However, there is significant proximal disease in the whole mid to distal segment is  diffusely diseased and small caliber. 2. Left ventricular angiography was not performed. EF was severe reduced by echo. 3. Moderately elevated left ventricular end-diastolic pressure at 28 mmHg  Cardiac catheterization done 04/28/17 showed significant three-vessel disease with a patient stent in the RCA and LAD. Significant proximal RCS disease and the stent as well as diffuse mid and distal disease. LAD has moderate disease. Severely elevated left ventricular end-diastolic pressure at 34 mmHg. Possible CABG in the future with optimizing medical management. Stress done in 2015.  Admitted 06/14/21 due to increased pain and redness of the left lower extremity for 2 weeks, fell due to weakness and pain in his leg, worsening productive cough with exertional chest discomfort and shortness of breath. Initially received fluid challenge of 1 L, treated with IV steroids and duo nebs.  Despite fluid challenge remained hypotensive so he was started on Levophed for possible cardiogenic shock. Cardiology and EP consults obtained. Initially given IV lasix with transition to oral diuretics. Elevated troponins thought to be due to demand ischemia. PT evaluation done. Discharged after 5 days. Was in the ED 05/27/21 due to mechanical fall with subsequent neck pain. Head/cervical CT obtained without acute changes. Admitted 03/25/21 due to fatigue, chest pain and shortness of breath. Needed levophed due to hypotension. Cardiology consult obtained.  CT angiogram no evidence of dissection,  effusion or PE. Given IV lasix with resultant net loss of >10L. Left AMA after 5 days. Admitted 03/18/21 due to redness of left  leg. Hypotensive and placed on vasopressors. Given antibiotics. Wound consult obtained. Elevated troponin thought to be due to demand ischemia. Discharged after 3 days. Was in the ED  01/19/21 due to fall out of bed after feeling light-headed along with feeling short of breath. Initially given IV lasix. Critically high  troponin of 299. Was hypoxic on room air into the low-mid 80's. Admission recommended but patient declined.   He presents today for a follow-up visit with a chief complaint of moderate shortness of  breath with little exertion. He describes this as chronic in nature having been present for several years although has worsened over the last several day. He has associated fatigue, cough, pedal edema (worsening), abdominal distention, light-headedness and chronic pain along with this. He denies any difficulty sleeping, palpitations or chest pain.   Due to swelling in his leg and right stump, he hasn't been able to stand and weigh himself at home nor did he feel safe to stand on the scale here. No change in his diet and he hasn't missed taking any medications nor run out of anything.   Past Medical History:  Diagnosis Date   Acute on chronic heart failure ([place])    AKI (acute kidney injury) ([place]) 01/03/2018   Angina at rest [facility]) 08/13/2017   Arthritis    Asthma    Atherosclerosis    BPH (benign prostatic hyperplasia)    Carotid arterial disease ([place])    Cellulitis of foot, left 09/27/2019   Charcot's joint of foot, right    Chronic combined systolic (congestive) and diastolic (congestive) heart failure ([place])    a. 02/2013 EF 50% by LV gram; b. 04/2017 Echo: EF 25-30%. diff HK. Gr2 DD; c. 06/2020 Echo: EF <20%, glob HK. RVS 53.2mmHg. Sev BAE. Mild to mod TR.   Chronic foot pain (Secondary Area of Pain) (Right) 01/29/2018   COPD (chronic obstructive pulmonary disease) (HCC)    COPD with acute exacerbation ([place]) 01/30/2020   Coronary artery disease    a. 2013 S/P PCI of LAD [facility]);  b. 03/2013 PCI: RCA 90p ( 2.5 x 23 mm DES); c. 02/2014 Cath: patent RCA stent->Med Rx; d. 04/2017 Cath: LM nl, LAd 30ost/p, 86m/d, D1 80ost, LCX 90p/m, OM1 85, RCA 70/20p, 40m, 70d->Referred for CT Surg-felt to be poor candidate; d. 01/2020 Cath: LM nl, LAD 20p/m, 60d. D1 80. LCX 90p/m. LPAV 95, RCA 80/20p, 80p/m.   Diabetic  neuropathy ([place])    Diabetic ulcer of right foot ([place])    Hernia    HFrEF (heart failure with reduced ejection fraction) ([place]) 01/30/2020   Hyperlipidemia    Hypertension    Ischemic cardiomyopathy    a. 02/2013 EF 50% by LV gram; b. 04/2017 Echo: EF 25-30%. diff HK. Gr2 DD; c. 06/2020 Echo: EF <20%, glob HK. RVS 53.39mmHg. Sev BAE. Mild to mod TR.   Morbid obesity ([place])    Neuropathy    PAD (peripheral artery disease) ([place])    a. Followed by Dr. Lucky Cowboy; b. 08/2010 Periph Angio: RSFA 70-80p (6X60 self-expanding stent); c. 09/2010 Periph Angio: L SFA 100p (7x unknown length self-expanding stent); d. 06/2015 Periph Angio: R SFA short segment occlusion (6x12 self-expanding stent).   Persistent atrial fibrillation (HCC)    Restless leg syndrome    Sepsis ([place]) 03/30/2018   Sleep apnea    Subclavian artery stenosis, right (HCC)    Syncope and collapse    Tobacco use    a. 75+ yr hx - still smoking 1ppd, down from  2 ppd.   Type II diabetes mellitus ([place])    Unstable angina ([place]) 07/11/2016   Varicose vein    Past Surgical History:  Procedure Laterality Date   ABDOMINAL AORTAGRAM N/A 06/23/2013   Procedure: ABDOMINAL Maxcine Ham;  Surgeon: Wellington Hampshire, MD;  Location: [place] CATH LAB;  Service: Cardiovascular;  Laterality: N/A;   AMPUTATION Right 04/01/2018   Procedure: AMPUTATION BELOW KNEE;  Surgeon: Algernon Huxley, MD;  Location: [facility] ORS;  Service: Vascular;  Laterality: Right;   AMPUTATION TOE Left 08/11/2020   Procedure: AMPUTATION TOE-2nd Toe;  Surgeon: Samara Deist, DPM;  Location: [facility] ORS;  Service: Podiatry;  Laterality: Left;   APPENDECTOMY     CARDIAC CATHETERIZATION  10/14   [facility]; X1 STENT PROXIMAL RCA   CARDIAC CATHETERIZATION  02/2010   [facility]   CARDIAC CAT[facility]IZATION  02/21/2014   [facility]   CLAVICLE SURGERY     HERNIA REPAIR     IRRIGATION AND DEBRIDEMENT FOOT Right 01/04/2018   Procedure: IRRIGATION AND DEBRIDEMENT FOOT;  Surgeon: Samara Deist, DPM;  Location: [facility] ORS;  Service:  Podiatry;  Laterality: Right;   LEFT HEART CATH AND CORONARY ANGIOGRAPHY N/A 01/20/2020   Procedure: LEFT HEART CATH AND CORONARY ANGIOGRAPHY;  Surgeon: Wellington Hampshire, MD;  Location: [place] CV LAB;  Service: Cardiovascular;  Laterality: N/A;   LEFT HEART CATH AND CORS/GRAFTS ANGIOGRAPHY N/A 04/28/2017   Procedure: LEFT HEART CATH AND CORONARY ANGIOGRAPHY;  Surgeon: Wellington Hampshire, MD;  Location: [place] CV LAB;  Service: Cardiovascular;  Laterality: N/A;   LOWER EXTREMITY ANGIOGRAPHY Right 03/03/2018   Procedure: LOWER EXTREMITY ANGIOGRAPHY;  Surgeon: Katha Cabal, MD;  Location: [place] CV LAB;  Service: Cardiovascular;  Laterality: Right;   LOWER EXTREMITY ANGIOGRAPHY Left 08/24/2018   Procedure: LOWER EXTREMITY ANGIOGRAPHY;  Surgeon: Algernon Huxley, MD;  Location: [place] CV LAB;  Service: Cardiovascular;  Laterality: Left;   LOWER EXTREMITY ANGIOGRAPHY Left 06/14/2019   Procedure: LOWER EXTREMITY ANGIOGRAPHY;  Surgeon: Algernon Huxley, MD;  Location: [place] CV LAB;  Service: Cardiovascular;  Laterality: Left;   LOWER EXTREMITY ANGIOGRAPHY Left 08/02/2019   Procedure: LOWER EXTREMITY ANGIOGRAPHY;  Surgeon: Algernon Huxley, MD;  Location: [place] CV LAB;  Service: Cardiovascular;  Laterality: Left;   LOWER EXTREMITY ANGIOGRAPHY Left 07/17/2020   Procedure: LOWER EXTREMITY ANGIOGRAPHY;  Surgeon: Algernon Huxley, MD;  Location: [place] CV LAB;  Service: Cardiovascular;  Laterality: Left;   PERIPHERAL ARTERIAL STENT GRAFT     x2 left/right   PERIPHERAL VASCULAR BALLOON ANGIOPLASTY Right 01/06/2018   Procedure: PERIPHERAL VASCULAR BALLOON ANGIOPLASTY;  Surgeon: Katha Cabal, MD;  Location: [place] CV LAB;  Service: Cardiovascular;  Laterality: Right;   PERIPHERAL VASCULAR CATHETERIZATION N/A 07/06/2015   Procedure: Abdominal Aortogram w/Lower Extremity;  Surgeon: Algernon Huxley, MD;  Location: [place] CV LAB;  Service: Cardiovascular;  Laterality:  N/A;   PERIPHERAL VASCULAR CATHETERIZATION  07/06/2015   Procedure: Lower Extremity Intervention;  Surgeon: Algernon Huxley, MD;  Location: [place] CV LAB;  Service: Cardiovascular;;   Family History  Problem Relation Age of Onset   Heart attack Father    Heart disease Father    Hypertension Father    Hyperlipidemia Father    Hypertension Mother    Hyperlipidemia Mother    Social History   Tobacco Use   Smoking status: Every Day    Packs/day: 1.00    Years: 41.00    Pack years: 41.00  Types: Cigarettes   Smokeless tobacco: Never  Substance Use Topics   Alcohol use: No   Allergies  Allergen Reactions   Dulaglutide Anaphylaxis, Diarrhea and Hives   Ace Inhibitors     hypotension   Other Itching    Skin itching associated with nitro patch   Prednisone Rash   Prior to Admission medications   Medication Sig Start Date End Date Taking? Authorizing Provider  albuterol (VENTOLIN HFA) 108 (90 Base) MCG/ACT inhaler Inhale into the lungs every 6 (six) hours as needed for wheezing or shortness of breath.   Yes [provider]  apixaban (ELIQUIS) 5 MG TABS tablet Take 5 mg by mouth 2 (two) times daily.   Yes [provider]  atorvastatin (LIPITOR) 80 MG tablet TAKE 1 TABLET BY MOUTH ONCE DAILY 06/12/21  Yes Gollan, Kathlene November, MD  carvedilol (COREG) 3.125 MG tablet TAKE 1 TABLET BY MOUTH TWICE DAILY WITH MEALS 06/12/21  Yes Gollan, Kathlene November, MD  clopidogrel (PLAVIX) 75 MG tablet TAKE 1 TABLET BY MOUTH ONCE DAILY 06/12/21  Yes Gollan, Kathlene November, MD  digoxin (LANOXIN) 0.125 MG tablet TAKE 1 TABLET BY MOUTH ONCE DAILY 06/12/21  Yes Gollan, Kathlene November, MD  DULoxetine (CYMBALTA) 60 MG capsule Take 60 mg by mouth daily.   Yes [provider]  ezetimibe (ZETIA) 10 MG tablet Take 1 tablet (10 mg total) by mouth daily. 10/09/20  Yes Minna Merritts, MD  Fluticasone-Umeclidin-Vilant 100-62.5-25 MCG/INH AEPB Inhale 1 puff into the lungs daily.   Yes [provider]   gabapentin (NEURONTIN) 800 MG tablet Take 800 mg by mouth 4 (four) times daily.  08/14/17 04/04/29 Yes [provider]  ipratropium-albuterol (DUONEB) 0.5-2.5 (3) MG/3ML SOLN Inhale 3 mLs into the lungs every 6 (six) hours as needed (wheezing/sob).    Yes [provider]  losartan (COZAAR) 25 MG tablet Take 0.5 tablets (12.5 mg total) by mouth daily. 10/09/20  Yes Minna Merritts, MD  metFORMIN (GLUCOPHAGE-XR) 500 MG 24 hr tablet Take 1,000 mg by mouth 2 (two) times daily. 01/02/21  Yes [provider]  Multiple Vitamin (MULTI-VITAMINS) TABS Take 1 tablet by mouth daily.    Yes [provider]  nitroGLYCERIN (NITROSTAT) 0.4 MG SL tablet Place 1 tablet (0.4 mg total) under the tongue every 5 (five) minutes as needed for chest pain. 10/09/20  Yes Minna Merritts, MD  omeprazole (PRILOSEC) 40 MG capsule Take 40 mg by mouth daily.   Yes [provider]  Oxycodone HCl 10 MG TABS Take 10 mg by mouth every 4 (four) hours as needed (pain).   Yes [provider]  polyethylene glycol (MIRALAX / GLYCOLAX) 17 g packet Take 17 g by mouth daily as needed for mild constipation. 08/12/20  Yes Wieting, Richard, MD  potassium chloride (KLOR-CON) 10 MEQ tablet TAKE 1 TABLET BY MOUTH TWICE DAILY 06/12/21  Yes Gollan, Kathlene November, MD  promethazine (PHENERGAN) 25 MG tablet Take 25 mg by mouth every 8 (eight) hours as needed. 04/02/21  Yes [provider]  spironolactone (ALDACTONE) 25 MG tablet Take 0.5 tablets (12.5 mg total) by mouth daily. 10/09/20  Yes Minna Merritts, MD  tamsulosin (FLOMAX) 0.4 MG CAPS capsule Take 1 capsule (0.4 mg total) by mouth daily after supper. 07/01/20  Yes Shawna Clamp, MD  traZODone (DESYREL) 150 MG tablet Take 150 mg by mouth at bedtime. 07/12/19  Yes [provider]  VERQUVO 10 MG TABS TAKE 1 TABLET BY MOUTH ONCE DAILY 11/16/20  Yes Darylene Price A, FNP  torsemide 40 MG TABS Take 40 mg by mouth 2 (two) times daily. Take 40 mg  by mouth twice daily. 03/30/21 06/14/21  Sidney Ace, MD   Review of Systems  Constitutional:  Positive for appetite change (decreased) and fatigue (moderate).  HENT:  Negative for congestion, rhinorrhea, sore throat and trouble swallowing.   Eyes: Negative.   Respiratory:  Positive for cough (productive) and shortness of breath (worsening).   Cardiovascular:  Positive for leg swelling (right stump & left leg). Negative for chest pain and palpitations.  Gastrointestinal:  Positive for abdominal distention and constipation. Negative for abdominal pain.  Endocrine: Negative.   Genitourinary: Negative.   Musculoskeletal:  Positive for arthralgias (pain in both legs) and neck pain.  Skin: Negative.   Allergic/Immunologic: Negative.   Neurological:  Positive for light-headedness and numbness (left arm at times). Negative for dizziness.  Hematological:  Negative for adenopathy. Bruises/bleeds easily.  Psychiatric/Behavioral:  Positive for dysphoric mood. Negative for sleep disturbance (sleeping on 2 pillows with HOB elevated). The patient is not nervous/anxious.    Vitals:   07/06/21 1411  BP: 112/76  Pulse: 93  Resp: 18  Height: 5\' 2"  (1.575 m)   Wt Readings from Last 3 Encounters:  06/18/21 182 lb 15.7 oz (83 kg)  05/27/21 160 lb (72.6 kg)  05/22/21 181 lb (82.1 kg)   Lab Results  Component Value Date   CREATININE 1.13 06/19/2021   CREATININE 0.97 06/18/2021   CREATININE 0.99 06/17/2021   Physical Exam Vitals and nursing note reviewed.  Constitutional:      Appearance: Normal appearance.  HENT:     Head: Normocephalic and atraumatic.  Cardiovascular:     Rate and Rhythm: Normal rate and regular rhythm.  Pulmonary:     Effort: Pulmonary effort is normal. No respiratory distress.     Breath sounds: No wheezing or rales.  Abdominal:     General: There is distension.     Palpations: Abdomen is soft.     Tenderness: There is no abdominal tenderness.  Musculoskeletal:      Cervical back: Normal range of motion and neck supple.     Right lower leg: Edema (right stump with 2+ pitting edema) present.     Left lower leg: No tenderness. Edema (2+ pitting) present.     Comments: Right lower leg prosthesis  Skin:    General: Skin is warm and dry.     Findings: Bruising (on arms) present.  Neurological:     General: No focal deficit present.     Mental Status: He is alert and oriented to person, place, and time.  Psychiatric:        Mood and Affect: Mood is depressed.        Behavior: Behavior normal.        Thought Content: Thought content normal.    Assessment & Plan:  1: Acute on Chronic heart failure with reduced ejection fraction- - NYHA class III - moderately fluid overloaded today with abdominal distention, bilateral pedal edema and worsening shortness of breath - will send for 80mg  IV lasix along with 90meq PO potassium - BMP/BNP to be drawn today - hasn't been weighing daily due to feeling unsteady; encouraged to resume when able and to call for an overnight weight gain of >2 pounds or a weekly weight gain of >5 pounds - too unsteady to weigh today - continues to drink ~ 40 ounces regular Mtn Dew and says that he's  addicted to them and can't stop; encouraged to at least drink 8 ounces of water daily  - saw cardiology Rockey Situ) 04/17/21 - saw pulmonology Raul Del) 10/18/20 - palliative care visit done 08/03/20 - BNP 06/14/21 was 2270.8 - participating in paramedicine program  - dig level 01/25/21 was 0.8  2: HTN- - BP looks good (112/76) - saw PCP 06/01/21 - BMP done 06/19/21 reviewed and shows sodium 128, potassium 4.7, creatinine 1.13 and GFR >60  3: Tobacco use- - currently smoking ~ 3 cigarettes daily - complete cessation discussed for 3 minutes with him  4: Severe atherosclerosis of lower extremities- - had left lower leg angiogram 07/17/20 - saw vascular Owens Shark) 05/09/21 - 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 Encouraged to look over our list that we will print and compare it to his actual medications.  Return in 1 week, sooner if needed.

## 2021-07-06 NOTE — Patient Instructions (Signed)
Continue weighing daily and call for an overnight weight gain of 3 pounds or more or a weekly weight gain of more than 5 pounds.  ° °The Heart Failure Clinic will be moving around the corner to [id number] mid-February. Our phone number will remain the same ° °

## 2021-07-10 ENCOUNTER — Other Ambulatory Visit (HOSPITAL_COMMUNITY): Payer: Self-pay

## 2021-07-10 NOTE — Progress Notes (Signed)
Today contacted [name] [name] to check on him.  He states not feeling no better.  He states the fluid is coming off slowly.  Will make a home visit tomorrow for heart failure, diet and medication management.   [place] [phone]

## 2021-07-11 ENCOUNTER — Encounter (HOSPITAL_COMMUNITY): Payer: Self-pay

## 2021-07-11 ENCOUNTER — Other Ambulatory Visit (HOSPITAL_COMMUNITY): Payer: Self-pay

## 2021-07-11 NOTE — Progress Notes (Signed)
Today had a home visit with [name] [name]. He states that he is not doing good but still kicking.  He has not lost any of the fluid after getting IV lasix last week.  He states did not urinate like normal.  His stump and other leg still has a lot of swelling in them.  He is weak and unable to stand.  He is sleeping in wheel chair with legs down instead of getting in the bed.  He states unable to.  He had girlfriend to help to get pants up but unable to get them up all the way.  He states taking all his meds.  He is unable to weigh.  He is drinking mt Dew when arrived.  He states tries to cut back and drink water also.  He states tries to watch high sodium foods.  Lungs has crackles in r and left, abdomen is swollen.  Asked if he would go to ED, but refused.  He states he will see Otila Kluver at HF clinic tomorrow and do what she suggest.  He was very polite and laughed a little.  His power was off during the visit but the whole neighborhood was out.  He has been out of water for 3 days, their pump is not working.  He states they are getting bottles filled up from daughters.  He is looking for someone to check it out, they will have the money on Friday.  Mood is depressed.  He states has everything else for daily living, enough foods, etc.  Advised him to make sure he makes the appt.  He denies chest pain, headaches, dizziness or increased shortness of breath.  Will continue to visit for heart failure, diet and medication management.   [place] [phone]

## 2021-07-12 ENCOUNTER — Ambulatory Visit (HOSPITAL_BASED_OUTPATIENT_CLINIC_OR_DEPARTMENT_OTHER): Payer: Medicare Other | Admitting: Family

## 2021-07-12 ENCOUNTER — Other Ambulatory Visit: Payer: Self-pay

## 2021-07-12 ENCOUNTER — Encounter: Payer: Self-pay | Admitting: Family

## 2021-07-12 VITALS — BP 92/59 | HR 83 | Resp 16 | Ht 67.0 in

## 2021-07-12 DIAGNOSIS — M542 Cervicalgia: Secondary | ICD-10-CM | POA: Insufficient documentation

## 2021-07-12 DIAGNOSIS — I5023 Acute on chronic systolic (congestive) heart failure: Secondary | ICD-10-CM

## 2021-07-12 DIAGNOSIS — I70219 Atherosclerosis of native arteries of extremities with intermittent claudication, unspecified extremity: Secondary | ICD-10-CM | POA: Diagnosis not present

## 2021-07-12 DIAGNOSIS — Z89511 Acquired absence of right leg below knee: Secondary | ICD-10-CM | POA: Insufficient documentation

## 2021-07-12 DIAGNOSIS — I70293 Other atherosclerosis of native arteries of extremities, bilateral legs: Secondary | ICD-10-CM | POA: Insufficient documentation

## 2021-07-12 DIAGNOSIS — F17210 Nicotine dependence, cigarettes, uncomplicated: Secondary | ICD-10-CM | POA: Insufficient documentation

## 2021-07-12 DIAGNOSIS — I10 Essential (primary) hypertension: Secondary | ICD-10-CM

## 2021-07-12 DIAGNOSIS — X58XXXA Exposure to other specified factors, initial encounter: Secondary | ICD-10-CM | POA: Insufficient documentation

## 2021-07-12 DIAGNOSIS — N400 Enlarged prostate without lower urinary tract symptoms: Secondary | ICD-10-CM | POA: Insufficient documentation

## 2021-07-12 DIAGNOSIS — J449 Chronic obstructive pulmonary disease, unspecified: Secondary | ICD-10-CM | POA: Insufficient documentation

## 2021-07-12 DIAGNOSIS — E1140 Type 2 diabetes mellitus with diabetic neuropathy, unspecified: Secondary | ICD-10-CM | POA: Insufficient documentation

## 2021-07-12 DIAGNOSIS — M79605 Pain in left leg: Secondary | ICD-10-CM | POA: Insufficient documentation

## 2021-07-12 DIAGNOSIS — Z89422 Acquired absence of other left toe(s): Secondary | ICD-10-CM | POA: Insufficient documentation

## 2021-07-12 DIAGNOSIS — R531 Weakness: Secondary | ICD-10-CM | POA: Insufficient documentation

## 2021-07-12 DIAGNOSIS — R200 Anesthesia of skin: Secondary | ICD-10-CM | POA: Insufficient documentation

## 2021-07-12 DIAGNOSIS — G4733 Obstructive sleep apnea (adult) (pediatric): Secondary | ICD-10-CM | POA: Insufficient documentation

## 2021-07-12 DIAGNOSIS — M79604 Pain in right leg: Secondary | ICD-10-CM | POA: Insufficient documentation

## 2021-07-12 DIAGNOSIS — E1151 Type 2 diabetes mellitus with diabetic peripheral angiopathy without gangrene: Secondary | ICD-10-CM | POA: Insufficient documentation

## 2021-07-12 DIAGNOSIS — I248 Other forms of acute ischemic heart disease: Secondary | ICD-10-CM | POA: Insufficient documentation

## 2021-07-12 DIAGNOSIS — Z955 Presence of coronary angioplasty implant and graft: Secondary | ICD-10-CM | POA: Insufficient documentation

## 2021-07-12 DIAGNOSIS — J441 Chronic obstructive pulmonary disease with (acute) exacerbation: Secondary | ICD-10-CM | POA: Diagnosis not present

## 2021-07-12 DIAGNOSIS — I959 Hypotension, unspecified: Secondary | ICD-10-CM | POA: Insufficient documentation

## 2021-07-12 DIAGNOSIS — E1159 Type 2 diabetes mellitus with other circulatory complications: Secondary | ICD-10-CM

## 2021-07-12 DIAGNOSIS — M2559 Pain in other specified joint: Secondary | ICD-10-CM | POA: Insufficient documentation

## 2021-07-12 DIAGNOSIS — Z9181 History of falling: Secondary | ICD-10-CM | POA: Insufficient documentation

## 2021-07-12 DIAGNOSIS — S90922A Unspecified superficial injury of left foot, initial encounter: Secondary | ICD-10-CM | POA: Insufficient documentation

## 2021-07-12 DIAGNOSIS — F32A Depression, unspecified: Secondary | ICD-10-CM | POA: Insufficient documentation

## 2021-07-12 DIAGNOSIS — I2510 Atherosclerotic heart disease of native coronary artery without angina pectoris: Secondary | ICD-10-CM | POA: Insufficient documentation

## 2021-07-12 DIAGNOSIS — I110 Hypertensive heart disease with heart failure: Secondary | ICD-10-CM | POA: Insufficient documentation

## 2021-07-12 DIAGNOSIS — E785 Hyperlipidemia, unspecified: Secondary | ICD-10-CM | POA: Insufficient documentation

## 2021-07-12 DIAGNOSIS — I130 Hypertensive heart and chronic kidney disease with heart failure and stage 1 through stage 4 chronic kidney disease, or unspecified chronic kidney disease: Secondary | ICD-10-CM | POA: Diagnosis not present

## 2021-07-12 NOTE — Progress Notes (Signed)
Patient ID: [name] [name], male    DOB: [birth year], 61 y.o.   MRN: [id number]  HPI  Mr Bleicher is a 61 y/o male with a history of obstructive sleep apnea, PVD, PAD, HTN, hyperlipidemia, DM, CAD, COPD, BPH, asthma, current tobacco use and chronic heart failure.   Echo report from 06/15/21 reviewed and showed an EF of <20% along with severely elevated PA pressure of 74.0 mmHg. Echo report from 03/26/21 reviewed and showed an EF of 20-25% along with severely elevated PA pressure and moderate MR. Echo report from 06/17/20 reviewed and showed an EF of <20% along with elevated PA pressure of 53.2 mmHg and mild/ moderate TR. Echo report from 01/20/20 reviewed and showed an EF of <20% along with mild MR and moderate TR. Echo report from 09/29/19 reviewed and showed an EF of 20-25% along with moderately elevated PA pressure, mild MR and mild/moderate TR. Echo report from 11/06/2018 reviewed and showed an EF of 20-25% along with moderate TR and a PA pressure of 59.6 mmHg. Echo report from 04/25/17 reviewed and shows an EF of 25-30% along with a PA pressure of 34 mm Hg. EF has declined from 55-60% back in 2016.   LHC done 01/20/20 showed: Prox RCA-1 lesion is 80% stenosed. Prox RCA-2 lesion is 20% stenosed. Prox RCA to Mid RCA lesion is 80% stenosed. Prox LAD lesion is 20% stenosed. 1st Diag lesion is 80% stenosed. Mid LAD lesion is 20% stenosed. Dist LAD lesion is 60% stenosed. Prox Cx to Mid Cx lesion is 90% stenosed. LPAV lesion is 95% stenosed.   1. Significant underlying three-vessel coronary artery disease. Patent proximal LAD stent with mild in-stent restenosis. Moderate diffuse disease in the distal LAD. Significant stenosis in the proximal left circumflex at the origin of the posterior AV groove artery which is also heavily diseased at the ostium. This is a bifurcation lesion and heavily calcified. RCA stent is patent. However, there is significant proximal disease in the whole mid to distal segment is  diffusely diseased and small caliber. 2. Left ventricular angiography was not performed. EF was severe reduced by echo. 3. Moderately elevated left ventricular end-diastolic pressure at 28 mmHg  Cardiac catheterization done 04/28/17 showed significant three-vessel disease with a patient stent in the RCA and LAD. Significant proximal RCS disease and the stent as well as diffuse mid and distal disease. LAD has moderate disease. Severely elevated left ventricular end-diastolic pressure at 34 mmHg. Possible CABG in the future with optimizing medical management. Stress done in 2015.  Admitted 06/14/21 due to increased pain and redness of the left lower extremity for 2 weeks, fell due to weakness and pain in his leg, worsening productive cough with exertional chest discomfort and shortness of breath. Initially received fluid challenge of 1 L, treated with IV steroids and duo nebs.  Despite fluid challenge remained hypotensive so he was started on Levophed for possible cardiogenic shock. Cardiology and EP consults obtained. Initially given IV lasix with transition to oral diuretics. Elevated troponins thought to be due to demand ischemia. PT evaluation done. Discharged after 5 days. Was in the ED 05/27/21 due to mechanical fall with subsequent neck pain. Head/cervical CT obtained without acute changes. Admitted 03/25/21 due to fatigue, chest pain and shortness of breath. Needed levophed due to hypotension. Cardiology consult obtained.  CT angiogram no evidence of dissection,  effusion or PE. Given IV lasix with resultant net loss of >10L. Left AMA after 5 days. Admitted 03/18/21 due to redness of left  leg. Hypotensive and placed on vasopressors. Given antibiotics. Wound consult obtained. Elevated troponin thought to be due to demand ischemia. Discharged after 3 days. Was in the ED  01/19/21 due to fall out of bed after feeling light-headed along with feeling short of breath. Initially given IV lasix. Critically high  troponin of 299. Was hypoxic on room air into the low-mid 80's. Admission recommended but patient declined.   He presents today for a follow-up visit with a chief complaint of moderate fatigue with little exertion. Describes this as chronic in nature having been present for several years. Has associated productive cough, shortness of breath (worsening), pedal edema (worsening), abdominal distention (worsening, light-headedness, depression and difficulty sleeping along with this. He denies any abdominal pain or chest pain although does mention have chest tightness at times.  Received 80mg  IV lasix/ 73meq PO potassium last week & says that it didn't help at all.  Says that he was without power for a day but it has since been restored. Has been without water for the last 3 days due to an issue with his water pump. Has been getting water from his daughter's house. Has someone coming to the house tomorrow to assess the water pump.   Has not been able to sleep in the bed recently because he's too weak to transfer to too many positions and it takes a lot out of him when doing so. Did sleep on the couch last night but, otherwise, has been sleeping in his wheelchair with his legs down.  He says that he can't even button his pants, his abdomen is so swollen.   Past Medical History:  Diagnosis Date   Acute on chronic heart failure ([place])    AKI (acute kidney injury) ([place]) 01/03/2018   Angina at rest [facility]) 08/13/2017   Arthritis    Asthma    Atherosclerosis    BPH (benign prostatic hyperplasia)    Carotid arterial disease ([place])    Cellulitis of foot, left 09/27/2019   Charcot's joint of foot, right    Chronic combined systolic (congestive) and diastolic (congestive) heart failure ([place])    a. 02/2013 EF 50% by LV gram; b. 04/2017 Echo: EF 25-30%. diff HK. Gr2 DD; c. 06/2020 Echo: EF <20%, glob HK. RVS 53.69mmHg. Sev BAE. Mild to mod TR.   Chronic foot pain (Secondary Area of Pain) (Right) 01/29/2018   COPD  (chronic obstructive pulmonary disease) (HCC)    COPD with acute exacerbation ([place]) 01/30/2020   Coronary artery disease    a. 2013 S/P PCI of LAD [facility]);  b. 03/2013 PCI: RCA 90p ( 2.5 x 23 mm DES); c. 02/2014 Cath: patent RCA stent->Med Rx; d. 04/2017 Cath: LM nl, LAd 30ost/p, 49m/d, D1 80ost, LCX 90p/m, OM1 85, RCA 70/20p, 55m, 70d->Referred for CT Surg-felt to be poor candidate; d. 01/2020 Cath: LM nl, LAD 20p/m, 60d. D1 80. LCX 90p/m. LPAV 95, RCA 80/20p, 80p/m.   Diabetic neuropathy ([place])    Diabetic ulcer of right foot ([place])    Hernia    HFrEF (heart failure with reduced ejection fraction) ([place]) 01/30/2020   Hyperlipidemia    Hypertension    Ischemic cardiomyopathy    a. 02/2013 EF 50% by LV gram; b. 04/2017 Echo: EF 25-30%. diff HK. Gr2 DD; c. 06/2020 Echo: EF <20%, glob HK. RVS 53.30mmHg. Sev BAE. Mild to mod TR.   Morbid obesity ([place])    Neuropathy    PAD (peripheral artery disease) ([place])    a. Followed by Dr.  Dew; b. 08/2010 Periph Angio: RSFA 70-80p (6X60 self-expanding stent); c. 09/2010 Periph Angio: L SFA 100p (7x unknown length self-expanding stent); d. 06/2015 Periph Angio: R SFA short segment occlusion (6x12 self-expanding stent).   Persistent atrial fibrillation (HCC)    Restless leg syndrome    Sepsis ([place]) 03/30/2018   Sleep apnea    Subclavian artery stenosis, right (HCC)    Syncope and collapse    Tobacco use    a. 75+ yr hx - still smoking 1ppd, down from 2 ppd.   Type II diabetes mellitus (HCC)    Unstable angina ([place]) 07/11/2016   Varicose vein    Past Surgical History:  Procedure Laterality Date   ABDOMINAL AORTAGRAM N/A 06/23/2013   Procedure: ABDOMINAL Maxcine Ham;  Surgeon: Wellington Hampshire, MD;  Location: [place] CATH LAB;  Service: Cardiovascular;  Laterality: N/A;   AMPUTATION Right 04/01/2018   Procedure: AMPUTATION BELOW KNEE;  Surgeon: Algernon Huxley, MD;  Location: [facility] ORS;  Service: Vascular;  Laterality: Right;   AMPUTATION TOE Left 08/11/2020   Procedure:  AMPUTATION TOE-2nd Toe;  Surgeon: Samara Deist, DPM;  Location: [facility] ORS;  Service: Podiatry;  Laterality: Left;   APPENDECTOMY     CARDIAC CATHETERIZATION  10/14   [place]; X1 STENT PROXIMAL RCA   CARDIAC CATHETERIZATION  02/2010   [facility]   CARDIAC CATHETERIZATION  02/21/2014   [facility]   CLAVICLE SURGERY     HERNIA REPAIR     IRRIGATION AND DEBRIDEMENT FOOT Right 01/04/2018   Procedure: IRRIGATION AND DEBRIDEMENT FOOT;  Surgeon: Samara Deist, DPM;  Location: [facility] ORS;  Service: Podiatry;  Laterality: Right;   LEFT HEART CATH AND CORONARY ANGIOGRAPHY N/A 01/20/2020   Procedure: LEFT HEART CATH AND CORONARY ANGIOGRAPHY;  Surgeon: Wellington Hampshire, MD;  Location: [place] CV LAB;  Service: Cardiovascular;  Laterality: N/A;   LEFT HEART CATH AND CORS/GRAFTS ANGIOGRAPHY N/A 04/28/2017   Procedure: LEFT HEART CATH AND CORONARY ANGIOGRAPHY;  Surgeon: Wellington Hampshire, MD;  Location: [place] CV LAB;  Service: Cardiovascular;  Laterality: N/A;   LOWER EXTREMITY ANGIOGRAPHY Right 03/03/2018   Procedure: LOWER EXTREMITY ANGIOGRAPHY;  Surgeon: Katha Cabal, MD;  Location: [place] CV LAB;  Service: Cardiovascular;  Laterality: Right;   LOWER EXTREMITY ANGIOGRAPHY Left 08/24/2018   Procedure: LOWER EXTREMITY ANGIOGRAPHY;  Surgeon: Algernon Huxley, MD;  Location: [place] CV LAB;  Service: Cardiovascular;  Laterality: Left;   LOWER EXTREMITY ANGIOGRAPHY Left 06/14/2019   Procedure: LOWER EXTREMITY ANGIOGRAPHY;  Surgeon: Algernon Huxley, MD;  Location: [place] CV LAB;  Service: Cardiovascular;  Laterality: Left;   LOWER EXTREMITY ANGIOGRAPHY Left 08/02/2019   Procedure: LOWER EXTREMITY ANGIOGRAPHY;  Surgeon: Algernon Huxley, MD;  Location: [place] CV LAB;  Service: Cardiovascular;  Laterality: Left;   LOWER EXTREMITY ANGIOGRAPHY Left 07/17/2020   Procedure: LOWER EXTREMITY ANGIOGRAPHY;  Surgeon: Algernon Huxley, MD;  Location: [place] CV LAB;  Service: Cardiovascular;  Laterality:  Left;   PERIPHERAL ARTERIAL STENT GRAFT     x2 left/right   PERIPHERAL VASCULAR BALLOON ANGIOPLASTY Right 01/06/2018   Procedure: PERIPHERAL VASCULAR BALLOON ANGIOPLASTY;  Surgeon: Katha Cabal, MD;  Location: [place] CV LAB;  Service: Cardiovascular;  Laterality: Right;   PERIPHERAL VASCULAR CATHETERIZATION N/A 07/06/2015   Procedure: Abdominal Aortogram w/Lower Extremity;  Surgeon: Algernon Huxley, MD;  Location: [place] CV LAB;  Service: Cardiovascular;  Laterality: N/A;   PERIPHERAL VASCULAR CATHETERIZATION  07/06/2015   Procedure: Lower Extremity Intervention;  Surgeon: Algernon Huxley, MD;  Location: [place] CV LAB;  Service: Cardiovascular;;   Family History  Problem Relation Age of Onset   Heart attack Father    Heart disease Father    Hypertension Father    Hyperlipidemia Father    Hypertension Mother    Hyperlipidemia Mother    Social History   Tobacco Use   Smoking status: Every Day    Packs/day: 1.00    Years: 41.00    Pack years: 41.00    Types: Cigarettes   Smokeless tobacco: Never  Substance Use Topics   Alcohol use: No   Allergies  Allergen Reactions   Dulaglutide Anaphylaxis, Diarrhea and Hives   Ace Inhibitors     hypotension   Other Itching    Skin itching associated with nitro patch   Prednisone Rash   Prior to Admission medications   Medication Sig Start Date End Date Taking? Authorizing Provider  albuterol (VENTOLIN HFA) 108 (90 Base) MCG/ACT inhaler Inhale into the lungs every 6 (six) hours as needed for wheezing or shortness of breath.   Yes [provider]  apixaban (ELIQUIS) 5 MG TABS tablet Take 5 mg by mouth 2 (two) times daily.   Yes [provider]  atorvastatin (LIPITOR) 80 MG tablet TAKE 1 TABLET BY MOUTH ONCE DAILY 06/12/21  Yes Gollan, Kathlene November, MD  carvedilol (COREG) 3.125 MG tablet TAKE 1 TABLET BY MOUTH TWICE DAILY WITH MEALS 06/12/21  Yes Gollan, Kathlene November, MD  clopidogrel (PLAVIX) 75 MG tablet TAKE 1  TABLET BY MOUTH ONCE DAILY 06/12/21  Yes Gollan, Kathlene November, MD  digoxin (LANOXIN) 0.125 MG tablet TAKE 1 TABLET BY MOUTH ONCE DAILY 06/12/21  Yes Gollan, Kathlene November, MD  DULoxetine (CYMBALTA) 60 MG capsule Take 60 mg by mouth daily.   Yes [provider]  ezetimibe (ZETIA) 10 MG tablet Take 1 tablet (10 mg total) by mouth daily. 10/09/20  Yes Minna Merritts, MD  Fluticasone-Umeclidin-Vilant 100-62.5-25 MCG/INH AEPB Inhale 1 puff into the lungs daily.   Yes [provider]  gabapentin (NEURONTIN) 800 MG tablet Take 800 mg by mouth 4 (four) times daily.  08/14/17 04/04/29 Yes [provider]  ipratropium-albuterol (DUONEB) 0.5-2.5 (3) MG/3ML SOLN Inhale 3 mLs into the lungs every 6 (six) hours as needed (wheezing/sob).    Yes [provider]  losartan (COZAAR) 25 MG tablet Take 0.5 tablets (12.5 mg total) by mouth daily. 10/09/20  Yes Minna Merritts, MD  metFORMIN (GLUCOPHAGE-XR) 500 MG 24 hr tablet Take 1,000 mg by mouth 2 (two) times daily. 01/02/21  Yes [provider]  Multiple Vitamin (MULTI-VITAMINS) TABS Take 1 tablet by mouth daily.    Yes [provider]  nitroGLYCERIN (NITROSTAT) 0.4 MG SL tablet Place 1 tablet (0.4 mg total) under the tongue every 5 (five) minutes as needed for chest pain. 10/09/20  Yes Minna Merritts, MD  omeprazole (PRILOSEC) 40 MG capsule Take 40 mg by mouth daily.   Yes [provider]  Oxycodone HCl 10 MG TABS Take 10 mg by mouth every 4 (four) hours as needed (pain).   Yes [provider]  polyethylene glycol (MIRALAX / GLYCOLAX) 17 g packet Take 17 g by mouth daily as needed for mild constipation. 08/12/20  Yes Wieting, Richard, MD  potassium chloride (KLOR-CON) 10 MEQ tablet TAKE 1 TABLET BY MOUTH TWICE DAILY 06/12/21  Yes Gollan, Kathlene November, MD  promethazine (PHENERGAN) 25 MG tablet Take 25 mg by mouth every  8 (eight) hours as needed. 04/02/21  Yes [provider]  spironolactone (ALDACTONE) 25  MG tablet Take 0.5 tablets (12.5 mg total) by mouth daily. 10/09/20  Yes Minna Merritts, MD  tamsulosin (FLOMAX) 0.4 MG CAPS capsule Take 1 capsule (0.4 mg total) by mouth daily after supper. 07/01/20  Yes Shawna Clamp, MD  traZODone (DESYREL) 150 MG tablet Take 150 mg by mouth at bedtime. 07/12/19  Yes [provider]  VERQUVO 10 MG TABS TAKE 1 TABLET BY MOUTH ONCE DAILY 11/16/20  Yes Darylene Price A, FNP  torsemide 40 MG TABS Take 40 mg by mouth 2 (two) times daily. Take 40 mg by mouth twice daily. 03/30/21 07/11/21  Sidney Ace, MD    Review of Systems  Constitutional:  Positive for appetite change (decreased) and fatigue (moderate).  HENT:  Negative for congestion, rhinorrhea, sore throat and trouble swallowing.   Eyes: Negative.   Respiratory:  Positive for cough (productive) and shortness of breath (worsening). Negative for chest tightness.   Cardiovascular:  Positive for leg swelling (right stump & left leg). Negative for chest pain and palpitations.  Gastrointestinal:  Positive for abdominal distention and constipation. Negative for abdominal pain.  Endocrine: Negative.   Genitourinary: Negative.   Musculoskeletal:  Positive for arthralgias (pain in both legs) and neck pain.  Skin:  Positive for wound (back of left heel).  Allergic/Immunologic: Negative.   Neurological:  Positive for light-headedness and numbness (left arm at times). Negative for dizziness.  Hematological:  Negative for adenopathy. Bruises/bleeds easily.  Psychiatric/Behavioral:  Positive for dysphoric mood and sleep disturbance. The patient is not nervous/anxious.    Vitals:   07/12/21 1421  BP: (!) 92/59  Pulse: 83  Resp: 16  SpO2: 98%  Height: 5\' 7"  (1.702 m)   Wt Readings from Last 3 Encounters:  06/18/21 182 lb 15.7 oz (83 kg)  05/27/21 160 lb (72.6 kg)  05/22/21 181 lb (82.1 kg)   Lab Results  Component Value Date   CREATININE 1.23 07/06/2021   CREATININE 1.13 06/19/2021   CREATININE  0.97 06/18/2021   Physical Exam Vitals and nursing note reviewed.  Constitutional:      Appearance: Normal appearance.  HENT:     Head: Normocephalic and atraumatic.  Cardiovascular:     Rate and Rhythm: Normal rate and regular rhythm.  Pulmonary:     Effort: No respiratory distress.     Breath sounds: Rales (bilateral lower lobes) present. No wheezing.  Abdominal:     General: There is distension (worsening).     Palpations: Abdomen is soft.     Tenderness: There is no abdominal tenderness.  Musculoskeletal:     Cervical back: Normal range of motion and neck supple.     Right lower leg: Edema (right stump with 2+ pitting edema) present.     Left lower leg: No tenderness. Edema (2+ pitting) present.  Skin:    General: Skin is warm and dry.     Comments: Wound noted on back of left heel  Neurological:     General: No focal deficit present.     Mental Status: He is alert and oriented to person, place, and time.  Psychiatric:        Mood and Affect: Mood is depressed.        Behavior: Behavior normal.        Thought Content: Thought content normal.    Assessment & Plan:  1: Acute on Chronic heart failure with reduced ejection fraction- -  NYHA class III - moderately fluid overloaded today with abdominal distention, bilateral pedal edema and worsening shortness of breath - hasn't been weighing daily due to feeling unsteady; encouraged to resume when able and to call for an overnight weight gain of >2 pounds or a weekly weight gain of >5 pounds - too unsteady to weigh today - abdomen more distended than last week and he's unable to button pants - saw cardiology Rockey Situ) 04/17/21 - saw pulmonology Raul Del) 10/18/20 - palliative care visit done 08/03/20 - BNP 07/06/21 was 1932.1 - participating in paramedicine program  - dig level 01/25/21 was 0.8  2: HTN- - BP low (92/59) - saw PCP 06/01/21 - BMP done 07/06/21 reviewed and shows sodium 132, potassium 4.8, creatinine 1.23 and GFR  >60  3: Tobacco use- - currently smoking ~ 3 cigarettes daily - complete cessation discussed for 3 minutes with him  4: Severe atherosclerosis of lower extremities- - had left lower leg angiogram 07/17/20 - saw vascular Owens Shark) 05/09/21 - 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 Explained my concern with his BP being low and that he appears more fluid overloaded than he did last week and this was after giving 80mg  IV lasix last week. Explained that I was concerned about giving metolazone or even giving more IV lasix unless he was being monitored in the ED due to his low BP. Patient verbalized understanding but says that he does not want to go now and needs to go home and "take care of some things". He commits to returning to the ED after he goes home and does what he needs to do. Emphasized how serious this was and that he is putting himself at risk by not going to the ED directly from here.   Is supposed to see cardiology next week.   Current f/u with me will be in 1 month, sooner pending ED disposition.

## 2021-07-12 NOTE — Patient Instructions (Addendum)
Continue weighing daily and call for an overnight weight gain of 3 pounds or more or a weekly weight gain of more than 5 pounds.  The Heart Failure Clinic will be moving around the corner to [id number] mid-February. Our phone number will remain the same   Go to the emergency room

## 2021-07-15 ENCOUNTER — Emergency Department: Payer: Medicare Other

## 2021-07-15 ENCOUNTER — Other Ambulatory Visit: Payer: Self-pay

## 2021-07-15 ENCOUNTER — Inpatient Hospital Stay
Admission: EM | Admit: 2021-07-15 | Discharge: 2021-07-21 | DRG: 264 | Disposition: A | Discharge status: Hospice | Payer: Medicare Other | Attending: Internal Medicine | Admitting: Internal Medicine

## 2021-07-15 DIAGNOSIS — E871 Hypo-osmolality and hyponatremia: Secondary | ICD-10-CM | POA: Diagnosis present

## 2021-07-15 DIAGNOSIS — Z7902 Long term (current) use of antithrombotics/antiplatelets: Secondary | ICD-10-CM

## 2021-07-15 DIAGNOSIS — E1122 Type 2 diabetes mellitus with diabetic chronic kidney disease: Secondary | ICD-10-CM | POA: Diagnosis present

## 2021-07-15 DIAGNOSIS — N179 Acute kidney failure, unspecified: Secondary | ICD-10-CM | POA: Diagnosis present

## 2021-07-15 DIAGNOSIS — Z91199 Patient's noncompliance with other medical treatment and regimen due to unspecified reason: Secondary | ICD-10-CM

## 2021-07-15 DIAGNOSIS — I2721 Secondary pulmonary arterial hypertension: Secondary | ICD-10-CM | POA: Diagnosis present

## 2021-07-15 DIAGNOSIS — I130 Hypertensive heart and chronic kidney disease with heart failure and stage 1 through stage 4 chronic kidney disease, or unspecified chronic kidney disease: Secondary | ICD-10-CM | POA: Diagnosis present

## 2021-07-15 DIAGNOSIS — G2581 Restless legs syndrome: Secondary | ICD-10-CM | POA: Diagnosis present

## 2021-07-15 DIAGNOSIS — I252 Old myocardial infarction: Secondary | ICD-10-CM

## 2021-07-15 DIAGNOSIS — I959 Hypotension, unspecified: Secondary | ICD-10-CM | POA: Diagnosis present

## 2021-07-15 DIAGNOSIS — Z20822 Contact with and (suspected) exposure to covid-19: Secondary | ICD-10-CM | POA: Diagnosis present

## 2021-07-15 DIAGNOSIS — I739 Peripheral vascular disease, unspecified: Secondary | ICD-10-CM | POA: Diagnosis not present

## 2021-07-15 DIAGNOSIS — E1169 Type 2 diabetes mellitus with other specified complication: Secondary | ICD-10-CM | POA: Diagnosis present

## 2021-07-15 DIAGNOSIS — E1142 Type 2 diabetes mellitus with diabetic polyneuropathy: Secondary | ICD-10-CM | POA: Diagnosis present

## 2021-07-15 DIAGNOSIS — I255 Ischemic cardiomyopathy: Secondary | ICD-10-CM | POA: Diagnosis present

## 2021-07-15 DIAGNOSIS — E1165 Type 2 diabetes mellitus with hyperglycemia: Secondary | ICD-10-CM | POA: Diagnosis not present

## 2021-07-15 DIAGNOSIS — I2510 Atherosclerotic heart disease of native coronary artery without angina pectoris: Secondary | ICD-10-CM | POA: Diagnosis not present

## 2021-07-15 DIAGNOSIS — F17210 Nicotine dependence, cigarettes, uncomplicated: Secondary | ICD-10-CM | POA: Diagnosis present

## 2021-07-15 DIAGNOSIS — Z720 Tobacco use: Secondary | ICD-10-CM | POA: Diagnosis not present

## 2021-07-15 DIAGNOSIS — Z8249 Family history of ischemic heart disease and other diseases of the circulatory system: Secondary | ICD-10-CM

## 2021-07-15 DIAGNOSIS — E1151 Type 2 diabetes mellitus with diabetic peripheral angiopathy without gangrene: Secondary | ICD-10-CM | POA: Diagnosis present

## 2021-07-15 DIAGNOSIS — J441 Chronic obstructive pulmonary disease with (acute) exacerbation: Secondary | ICD-10-CM | POA: Diagnosis present

## 2021-07-15 DIAGNOSIS — L97429 Non-pressure chronic ulcer of left heel and midfoot with unspecified severity: Secondary | ICD-10-CM | POA: Diagnosis present

## 2021-07-15 DIAGNOSIS — Z515 Encounter for palliative care: Secondary | ICD-10-CM

## 2021-07-15 DIAGNOSIS — Z95828 Presence of other vascular implants and grafts: Secondary | ICD-10-CM

## 2021-07-15 DIAGNOSIS — E11621 Type 2 diabetes mellitus with foot ulcer: Secondary | ICD-10-CM | POA: Diagnosis present

## 2021-07-15 DIAGNOSIS — I4510 Unspecified right bundle-branch block: Secondary | ICD-10-CM | POA: Diagnosis present

## 2021-07-15 DIAGNOSIS — I5023 Acute on chronic systolic (congestive) heart failure: Principal | ICD-10-CM

## 2021-07-15 DIAGNOSIS — G894 Chronic pain syndrome: Secondary | ICD-10-CM | POA: Diagnosis present

## 2021-07-15 DIAGNOSIS — I25118 Atherosclerotic heart disease of native coronary artery with other forms of angina pectoris: Secondary | ICD-10-CM | POA: Diagnosis not present

## 2021-07-15 DIAGNOSIS — I248 Other forms of acute ischemic heart disease: Secondary | ICD-10-CM | POA: Diagnosis present

## 2021-07-15 DIAGNOSIS — I9589 Other hypotension: Secondary | ICD-10-CM | POA: Diagnosis not present

## 2021-07-15 DIAGNOSIS — Z7901 Long term (current) use of anticoagulants: Secondary | ICD-10-CM | POA: Diagnosis not present

## 2021-07-15 DIAGNOSIS — Z89511 Acquired absence of right leg below knee: Secondary | ICD-10-CM

## 2021-07-15 DIAGNOSIS — Z955 Presence of coronary angioplasty implant and graft: Secondary | ICD-10-CM

## 2021-07-15 DIAGNOSIS — I5043 Acute on chronic combined systolic (congestive) and diastolic (congestive) heart failure: Secondary | ICD-10-CM | POA: Diagnosis present

## 2021-07-15 DIAGNOSIS — G4733 Obstructive sleep apnea (adult) (pediatric): Secondary | ICD-10-CM | POA: Diagnosis present

## 2021-07-15 DIAGNOSIS — I4821 Permanent atrial fibrillation: Secondary | ICD-10-CM | POA: Diagnosis present

## 2021-07-15 DIAGNOSIS — Z9114 Patient's other noncompliance with medication regimen: Secondary | ICD-10-CM | POA: Diagnosis not present

## 2021-07-15 DIAGNOSIS — Z83438 Family history of other disorder of lipoprotein metabolism and other lipidemia: Secondary | ICD-10-CM

## 2021-07-15 DIAGNOSIS — Z66 Do not resuscitate: Secondary | ICD-10-CM | POA: Diagnosis present

## 2021-07-15 DIAGNOSIS — N1831 Chronic kidney disease, stage 3a: Secondary | ICD-10-CM | POA: Diagnosis present

## 2021-07-15 DIAGNOSIS — Z79899 Other long term (current) drug therapy: Secondary | ICD-10-CM

## 2021-07-15 DIAGNOSIS — Z6832 Body mass index (BMI) 32.0-32.9, adult: Secondary | ICD-10-CM

## 2021-07-15 DIAGNOSIS — S88111A Complete traumatic amputation at level between knee and ankle, right lower leg, initial encounter: Secondary | ICD-10-CM | POA: Diagnosis present

## 2021-07-15 DIAGNOSIS — Z89422 Acquired absence of other left toe(s): Secondary | ICD-10-CM

## 2021-07-15 DIAGNOSIS — R9431 Abnormal electrocardiogram [ECG] [EKG]: Secondary | ICD-10-CM

## 2021-07-15 DIAGNOSIS — R0989 Other specified symptoms and signs involving the circulatory and respiratory systems: Secondary | ICD-10-CM | POA: Diagnosis present

## 2021-07-15 DIAGNOSIS — T380X5A Adverse effect of glucocorticoids and synthetic analogues, initial encounter: Secondary | ICD-10-CM | POA: Diagnosis not present

## 2021-07-15 DIAGNOSIS — E785 Hyperlipidemia, unspecified: Secondary | ICD-10-CM | POA: Diagnosis present

## 2021-07-15 DIAGNOSIS — Z7984 Long term (current) use of oral hypoglycemic drugs: Secondary | ICD-10-CM

## 2021-07-15 DIAGNOSIS — Z7189 Other specified counseling: Secondary | ICD-10-CM | POA: Diagnosis not present

## 2021-07-15 DIAGNOSIS — Z888 Allergy status to other drugs, medicaments and biological substances status: Secondary | ICD-10-CM

## 2021-07-15 LAB — COMPREHENSIVE METABOLIC PANEL
ALT: 14 U/L (ref 0–44)
AST: 32 U/L (ref 15–41)
Albumin: 3.2 g/dL — ABNORMAL LOW (ref 3.5–5.0)
Alkaline Phosphatase: 108 U/L (ref 38–126)
Anion gap: 7 (ref 5–15)
BUN: 39 mg/dL — ABNORMAL HIGH (ref 6–20)
CO2: 26 mmol/L (ref 22–32)
Calcium: 8.1 mg/dL — ABNORMAL LOW (ref 8.9–10.3)
Chloride: 90 mmol/L — ABNORMAL LOW (ref 98–111)
Creatinine, Ser: 1.55 mg/dL — ABNORMAL HIGH (ref 0.61–1.24)
GFR, Estimated: 52 mL/min — ABNORMAL LOW (ref >60)
Glucose, Bld: 98 mg/dL (ref 70–99)
Potassium: 5.1 mmol/L (ref 3.5–5.1)
Sodium: 123 mmol/L — ABNORMAL LOW (ref 135–145)
Total Bilirubin: 2.9 mg/dL — ABNORMAL HIGH (ref 0.3–1.2)
Total Protein: 6.5 g/dL (ref 6.5–8.1)

## 2021-07-15 LAB — PROTIME-INR
INR: 1.4 — ABNORMAL HIGH (ref 0.8–1.2)
Prothrombin Time: 16.7 seconds — ABNORMAL HIGH (ref 11.4–15.2)

## 2021-07-15 LAB — TROPONIN I (HIGH SENSITIVITY)
Troponin I (High Sensitivity): 71 ng/L — ABNORMAL HIGH (ref <18)
Troponin I (High Sensitivity): 78 ng/L — ABNORMAL HIGH (ref <18)

## 2021-07-15 LAB — RESP PANEL BY RT-PCR (FLU A&B, COVID) ARPGX2
Influenza A by PCR: NEGATIVE
Influenza B by PCR: NEGATIVE
SARS Coronavirus 2 by RT PCR: NEGATIVE

## 2021-07-15 LAB — CBC
HCT: 28.6 % — ABNORMAL LOW (ref 39.0–52.0)
Hemoglobin: 9 g/dL — ABNORMAL LOW (ref 13.0–17.0)
MCH: 25.3 pg — ABNORMAL LOW (ref 26.0–34.0)
MCHC: 31.5 g/dL (ref 30.0–36.0)
MCV: 80.3 fL (ref 80.0–100.0)
Platelets: 169 10*3/uL (ref 150–400)
RBC: 3.56 MIL/uL — ABNORMAL LOW (ref 4.22–5.81)
RDW: 19.9 % — ABNORMAL HIGH (ref 11.5–15.5)
WBC: 5.6 10*3/uL (ref 4.0–10.5)
nRBC: 0 % (ref 0.0–0.2)

## 2021-07-15 LAB — BRAIN NATRIURETIC PEPTIDE: B Natriuretic Peptide: 1987.6 pg/mL — ABNORMAL HIGH (ref 0.0–100.0)

## 2021-07-15 LAB — PROCALCITONIN: Procalcitonin: <0.1 ng/mL

## 2021-07-15 LAB — APTT: aPTT: 38 seconds — ABNORMAL HIGH (ref 24–36)

## 2021-07-15 MED ORDER — PROCHLORPERAZINE EDISYLATE 10 MG/2ML IJ SOLN
10.0000 mg | Freq: Four times a day (QID) | INTRAMUSCULAR | Status: DC | PRN
  Filled 2021-07-15: qty 2

## 2021-07-15 MED ORDER — FUROSEMIDE 10 MG/ML IJ SOLN
40.0000 mg | Freq: Once | INTRAMUSCULAR | Status: AC
  Administered 2021-07-15: 40 mg via INTRAVENOUS
  Filled 2021-07-15: qty 4

## 2021-07-15 MED ORDER — CLOPIDOGREL BISULFATE 75 MG PO TABS
75.0000 mg | ORAL_TABLET | Freq: Every day | ORAL | Status: DC
  Administered 2021-07-16 – 2021-07-20 (×5): 75 mg via ORAL
  Filled 2021-07-15 (×5): qty 1

## 2021-07-15 MED ORDER — NICOTINE 21 MG/24HR TD PT24
21.0000 mg | MEDICATED_PATCH | Freq: Every day | TRANSDERMAL | Status: DC
  Administered 2021-07-16 – 2021-07-20 (×5): 21 mg via TRANSDERMAL
  Filled 2021-07-15 (×5): qty 1

## 2021-07-15 MED ORDER — POTASSIUM CHLORIDE ER 10 MEQ PO TBCR
10.0000 meq | EXTENDED_RELEASE_TABLET | Freq: Two times a day (BID) | ORAL | Status: DC
  Filled 2021-07-15 (×2): qty 1

## 2021-07-15 MED ORDER — SODIUM CHLORIDE 0.9% FLUSH
3.0000 mL | Freq: Two times a day (BID) | INTRAVENOUS | Status: DC
  Administered 2021-07-15 – 2021-07-17 (×4): 3 mL via INTRAVENOUS

## 2021-07-15 MED ORDER — GABAPENTIN 800 MG PO TABS
800.0000 mg | ORAL_TABLET | Freq: Four times a day (QID) | ORAL | Status: DC
  Filled 2021-07-15 (×2): qty 1

## 2021-07-15 MED ORDER — FLUTICASONE-UMECLIDIN-VILANT 100-62.5-25 MCG/INH IN AEPB: 1.0000 | INHALATION_SPRAY | Freq: Every day | RESPIRATORY_TRACT | Status: DC

## 2021-07-15 MED ORDER — IPRATROPIUM-ALBUTEROL 0.5-2.5 (3) MG/3ML IN SOLN
6.0000 mL | Freq: Once | RESPIRATORY_TRACT | Status: AC
  Administered 2021-07-15: 6 mL via RESPIRATORY_TRACT
  Filled 2021-07-15: qty 6

## 2021-07-15 MED ORDER — DIGOXIN 125 MCG PO TABS
125.0000 ug | ORAL_TABLET | Freq: Every day | ORAL | Status: DC
  Administered 2021-07-16: 125 ug via ORAL
  Filled 2021-07-15 (×2): qty 1

## 2021-07-15 MED ORDER — METHYLPREDNISOLONE SODIUM SUCC 125 MG IJ SOLR
125.0000 mg | Freq: Once | INTRAMUSCULAR | Status: AC
  Administered 2021-07-15: 125 mg via INTRAVENOUS
  Filled 2021-07-15: qty 2

## 2021-07-15 MED ORDER — FUROSEMIDE 10 MG/ML IJ SOLN
60.0000 mg | Freq: Two times a day (BID) | INTRAMUSCULAR | Status: DC
  Administered 2021-07-15: 60 mg via INTRAVENOUS
  Filled 2021-07-15 (×2): qty 6

## 2021-07-15 MED ORDER — APIXABAN 5 MG PO TABS
5.0000 mg | ORAL_TABLET | Freq: Two times a day (BID) | ORAL | Status: DC
  Administered 2021-07-15 – 2021-07-16 (×3): 5 mg via ORAL
  Filled 2021-07-15 (×4): qty 1

## 2021-07-15 MED ORDER — PANTOPRAZOLE SODIUM 40 MG PO TBEC
40.0000 mg | DELAYED_RELEASE_TABLET | Freq: Every day | ORAL | Status: DC
  Administered 2021-07-16 – 2021-07-20 (×5): 40 mg via ORAL
  Filled 2021-07-15 (×5): qty 1

## 2021-07-15 MED ORDER — DULOXETINE HCL 30 MG PO CPEP
60.0000 mg | ORAL_CAPSULE | Freq: Every day | ORAL | Status: DC
  Administered 2021-07-16 – 2021-07-20 (×5): 60 mg via ORAL
  Filled 2021-07-15 (×5): qty 2

## 2021-07-15 MED ORDER — VERICIGUAT 10 MG PO TABS
1.0000 | ORAL_TABLET | Freq: Every day | ORAL | Status: DC
Start: 2021-07-16 — End: 2021-07-17

## 2021-07-15 MED ORDER — ACETAMINOPHEN 650 MG RE SUPP: 650.0000 mg | Freq: Four times a day (QID) | RECTAL | Status: DC | PRN

## 2021-07-15 MED ORDER — ALBUTEROL SULFATE HFA 108 (90 BASE) MCG/ACT IN AERS: 2.0000 | INHALATION_SPRAY | RESPIRATORY_TRACT | Status: DC | PRN

## 2021-07-15 MED ORDER — SODIUM CHLORIDE 0.9 % IV SOLN: 250.0000 mL | INTRAVENOUS | Status: DC | PRN

## 2021-07-15 MED ORDER — IPRATROPIUM-ALBUTEROL 0.5-2.5 (3) MG/3ML IN SOLN
3.0000 mL | Freq: Four times a day (QID) | RESPIRATORY_TRACT | Status: DC | PRN
  Administered 2021-07-16 – 2021-07-17 (×2): 3 mL via RESPIRATORY_TRACT
  Filled 2021-07-15 (×2): qty 3

## 2021-07-15 MED ORDER — EZETIMIBE 10 MG PO TABS
10.0000 mg | ORAL_TABLET | Freq: Every day | ORAL | Status: DC
  Administered 2021-07-16 – 2021-07-20 (×5): 10 mg via ORAL
  Filled 2021-07-15 (×6): qty 1

## 2021-07-15 MED ORDER — ACETAMINOPHEN 325 MG PO TABS
650.0000 mg | ORAL_TABLET | Freq: Four times a day (QID) | ORAL | Status: DC | PRN
  Administered 2021-07-21: 650 mg via ORAL
  Filled 2021-07-15: qty 2

## 2021-07-15 MED ORDER — SODIUM CHLORIDE 0.9% FLUSH: 3.0000 mL | INTRAVENOUS | Status: DC | PRN

## 2021-07-15 MED ORDER — METFORMIN HCL ER 500 MG PO TB24
1000.0000 mg | ORAL_TABLET | Freq: Two times a day (BID) | ORAL | Status: DC
  Administered 2021-07-16 (×2): 1000 mg via ORAL
  Filled 2021-07-15 (×3): qty 2

## 2021-07-15 MED ORDER — ALBUTEROL SULFATE (2.5 MG/3ML) 0.083% IN NEBU
2.5000 mg | INHALATION_SOLUTION | RESPIRATORY_TRACT | Status: DC | PRN
  Administered 2021-07-17: 2.5 mg via RESPIRATORY_TRACT
  Filled 2021-07-15: qty 3

## 2021-07-15 MED ORDER — NITROGLYCERIN 0.4 MG SL SUBL
0.4000 mg | SUBLINGUAL_TABLET | SUBLINGUAL | Status: DC | PRN
  Administered 2021-07-17: 0.4 mg via SUBLINGUAL

## 2021-07-15 MED ORDER — MIDODRINE HCL 5 MG PO TABS
5.0000 mg | ORAL_TABLET | Freq: Once | ORAL | Status: AC
  Administered 2021-07-15: 5 mg via ORAL
  Filled 2021-07-15: qty 1

## 2021-07-15 MED ORDER — SENNOSIDES-DOCUSATE SODIUM 8.6-50 MG PO TABS: 1.0000 | ORAL_TABLET | Freq: Every evening | ORAL | Status: DC | PRN

## 2021-07-15 MED ORDER — ADULT MULTIVITAMIN W/MINERALS CH
1.0000 | ORAL_TABLET | Freq: Every day | ORAL | Status: DC
  Administered 2021-07-16 – 2021-07-20 (×5): 1 via ORAL
  Filled 2021-07-15 (×5): qty 1

## 2021-07-15 MED ORDER — FLUTICASONE FUROATE-VILANTEROL 100-25 MCG/ACT IN AEPB
1.0000 | INHALATION_SPRAY | Freq: Every day | RESPIRATORY_TRACT | Status: DC
  Administered 2021-07-16 – 2021-07-20 (×5): 1 via RESPIRATORY_TRACT
  Filled 2021-07-15 (×2): qty 28

## 2021-07-15 MED ORDER — TAMSULOSIN HCL 0.4 MG PO CAPS
0.4000 mg | ORAL_CAPSULE | Freq: Every day | ORAL | Status: DC
  Administered 2021-07-16 – 2021-07-20 (×5): 0.4 mg via ORAL
  Filled 2021-07-15 (×5): qty 1

## 2021-07-15 MED ORDER — ATORVASTATIN CALCIUM 20 MG PO TABS
80.0000 mg | ORAL_TABLET | Freq: Every day | ORAL | Status: DC
  Administered 2021-07-16 – 2021-07-20 (×5): 80 mg via ORAL
  Filled 2021-07-15: qty 4
  Filled 2021-07-15: qty 1
  Filled 2021-07-15: qty 4
  Filled 2021-07-15: qty 1
  Filled 2021-07-15: qty 4

## 2021-07-15 MED ORDER — OXYCODONE HCL 5 MG PO TABS
10.0000 mg | ORAL_TABLET | Freq: Four times a day (QID) | ORAL | Status: DC | PRN
  Filled 2021-07-15: qty 2

## 2021-07-15 MED ORDER — UMECLIDINIUM BROMIDE 62.5 MCG/ACT IN AEPB
1.0000 | INHALATION_SPRAY | Freq: Every day | RESPIRATORY_TRACT | Status: DC
  Administered 2021-07-16 – 2021-07-20 (×5): 1 via RESPIRATORY_TRACT
  Filled 2021-07-15 (×2): qty 7

## 2021-07-15 NOTE — ED Triage Notes (Signed)
Pt states he has been having increased swelling in his legs and in his back- pt went to the heart failure clinic Thursday but they could not given him lasix d/t his blood pressure being low- pt also had a wound on his L leg

## 2021-07-15 NOTE — ED Provider Notes (Signed)
[facility] Provider Note    Event Date/Time   First MD Initiated Contact with Patient 07/15/21 1600     (approximate)   History   Leg Swelling   HPI  [name] [name] is a 61 y.o. male who presents to the ED for evaluation of Leg Swelling   I reviewed DC summary from 1/10 patient was admitted for acute on chronic CHF and cardiogenic shock.  History of coronary disease and ischemic cardiomyopathy with EF of 20%, COPD, A. fib, DM, OSA.  He is on Eliquis and Plavix, no aspirin.  Digoxin. Was seen in CHF clinic 3 days ago and I review this visit.  He apparently looked volume overload, but had soft blood pressures and not amenable to diuresis.  He was urged to go to the ED, but refused.  Patient presents to the ED, accompanied by his girlfriend, for evaluation of increasing lower extremity swelling, shortness of breath and nonproductive cough.  He reports generalized weakness and "feeling like crap" for the past few days.  Reports that he knew he should come to the ED few days ago, but he had "some things to take care of."  Denies fevers, falls or syncope.  Denies chest pain, but does report chronic pain throughout his whole body inferior to his umbilicus.  He reports compliance with his medications.  Physical Exam   Triage Vital Signs: ED Triage Vitals  Enc Vitals Group     BP 07/15/21 1555 (!) 102/54     Pulse Rate 07/15/21 1555 (!) 59     Resp 07/15/21 1555 18     Temp 07/15/21 1555 98 F (36.7 C)     Temp Source 07/15/21 1555 Oral     SpO2 07/15/21 1555 95 %     Weight 07/15/21 1550 180 lb (81.6 kg)     Height 07/15/21 1550 5\' 7"  (1.702 m)     Head Circumference --      Peak Flow --      Pain Score 07/15/21 1550 10     Pain Loc --      Pain Edu? --      Excl. in [place]? --     Most recent vital signs: Vitals:   07/15/21 1730 07/15/21 1800  BP: (!) 106/54 (!) 94/43  Pulse:  64  Resp: 10 14  Temp:    SpO2:  94%    General: Awake, no distress.   Sitting upright in bed without distress.  Pleasant and conversational.  Follows commands in all 4. CV:  RRR.  Delayed capillary refill to the extremities and poorly palpable left DP pulse.  S/p right BKA with clean stump. Resp:   Diffuse expiratory wheezes, poor airflow throughout and with bibasilar crackles.  He is slightly tachypneic to the mid 20s. Abd:  Minimal distention.  No tenderness or guarding MSK:  S/p right BKA with clean stump.  Superficial ulcerative wounds to the heel of the left foot.  No purulence.  Does have pitting edema to this left lower leg Neuro:  No focal deficits appreciated. Other:     ED Results / Procedures / Treatments   Labs (all labs ordered are listed, but only abnormal results are displayed) Labs Reviewed  CBC - Abnormal; Notable for the following components:      Result Value   RBC 3.56 (*)    Hemoglobin 9.0 (*)    HCT 28.6 (*)    MCH 25.3 (*)    RDW 19.9 (*)  All other components within normal limits  BRAIN NATRIURETIC PEPTIDE - Abnormal; Notable for the following components:   B Natriuretic Peptide 1,987.6 (*)    All other components within normal limits  COMPREHENSIVE METABOLIC PANEL - Abnormal; Notable for the following components:   Sodium 123 (*)    Chloride 90 (*)    BUN 39 (*)    Creatinine, Ser 1.55 (*)    Calcium 8.1 (*)    Albumin 3.2 (*)    Total Bilirubin 2.9 (*)    GFR, Estimated 52 (*)    All other components within normal limits  PROTIME-INR - Abnormal; Notable for the following components:   Prothrombin Time 16.7 (*)    INR 1.4 (*)    All other components within normal limits  APTT - Abnormal; Notable for the following components:   aPTT 38 (*)    All other components within normal limits  TROPONIN I (HIGH SENSITIVITY) - Abnormal; Notable for the following components:   Troponin I (High Sensitivity) 71 (*)    All other components within normal limits  RESP PANEL BY RT-PCR (FLU A&B, COVID) [id number]  PROCALCITONIN   PROCALCITONIN  TROPONIN I (HIGH SENSITIVITY)    EKG Atrial fibrillation with rate of 56 bpm.  Normal axis.  Left bundle morphology without STEMI per Sgarbossa criteria.  Couple PVCs.  RADIOLOGY 1 view CXR reviewed by me with cardiomegaly and pulmonary vascular congestion  Official radiology report(s): DG Chest Portable 1 View  Result Date: 07/15/2021 CLINICAL DATA:  Shortness of breath, volume overload or COPD. EXAM: PORTABLE CHEST 1 VIEW COMPARISON:  Chest radiograph dated in June 14, 2021. FINDINGS: Heart is enlarged. Atherosclerotic calcification of the aortic arch. Pulmonary vascular congestion without evidence of pulmonary edema or pleural effusion. No focal consolidation. IMPRESSION: 1. Stable cardiomegaly with atherosclerotic calcification of the aortic arch. 2. Mild pulmonary vascular congestion. No evidence of pulmonary edema or infiltrate. Electronically Signed   By: Keane Police D.O.   On: 07/15/2021 16:38    PROCEDURES and INTERVENTIONS:  .1-3 Lead EKG Interpretation Performed by: Vladimir Crofts, MD Authorized by: Vladimir Crofts, MD     Interpretation: normal     ECG rate:  60   ECG rate assessment: normal     Rhythm: sinus rhythm     Ectopy: none     Conduction: normal   .Critical Care Performed by: Vladimir Crofts, MD Authorized by: Vladimir Crofts, MD   Critical care provider statement:    Critical care time (minutes):  30   Critical care time was exclusive of:  Separately billable procedures and treating other patients   Critical care was necessary to treat or prevent imminent or life-threatening deterioration of the following conditions:  Circulatory failure   Critical care was time spent personally by me on the following activities:  Development of treatment plan with patient or surrogate, discussions with consultants, evaluation of patient's response to treatment, examination of patient, ordering and review of laboratory studies, ordering and review of radiographic  studies, ordering and performing treatments and interventions, pulse oximetry, re-evaluation of patient's condition and review of old charts  Medications  furosemide (LASIX) injection 40 mg (has no administration in time range)  midodrine (PROAMATINE) tablet 5 mg (has no administration in time range)  methylPREDNISolone sodium succinate (SOLU-MEDROL) 125 mg/2 mL injection 125 mg (125 mg Intravenous Given 07/15/21 1659)  ipratropium-albuterol (DUONEB) 0.5-2.5 (3) MG/3ML nebulizer solution 6 mL (6 mLs Nebulization Given 07/15/21 1650)     IMPRESSION / MDM /  ASSESSMENT AND PLAN / ED COURSE  I reviewed the triage vital signs and the nursing notes.  61 year old male with terrible ejection fraction presents to the ED with increased swelling and shortness of breath, with evidence of both COPD and CHF exacerbations requiring medical admission.  He has soft pressures that we will start midodrine for, but no hypoxia.  He looks okay clinically without distress, but certainly appears volume overloaded.  Has stigmata of COPD exacerbation on examination as well with wheezing and tachypnea.  Work-up with slight decrement in renal dysfunction, possibly cardiorenal in the setting of his volume overload and CXR is congested.  Chronic anemia and his BNP is quite elevated.  Troponin is slightly elevated and his EKG is nonischemic.  Provided treatment for his COPD and initiated diuresis in conjunction with midodrine due to his soft pressures.  Consulted with medicine who agrees to admit.  Clinical Course as of 07/15/21 1811  Nancy Fetter Jul 15, 2021  1621 Discussed plan of care with the patient.  I discussed my concerns for CHF and COPD.  We discussed likely admission.  We discussed CODE STATUS and he reiterates full code. [DS]  9242 Reassessed.  Feeling little bit better with the breathing treatment. [DS]  1810 I consult with hospitalist who agrees to admit. [DS]    Clinical Course User Index [DS] Vladimir Crofts, MD      FINAL CLINICAL IMPRESSION(S) / ED DIAGNOSES   Final diagnoses:  Acute on chronic systolic congestive heart failure (HCC)  COPD exacerbation ([place])     Rx / DC Orders   ED Discharge Orders     None        Note:  This document was prepared using Dragon voice recognition software and may include unintentional dictation errors.   Vladimir Crofts, MD 07/15/21 [phone]

## 2021-07-15 NOTE — H&P (Signed)
History and Physical    [name] XHB:[id number] DOB: [birth year] DOA: 07/15/2021  PCP: [name], MD   Patient coming from: Home  Chief Complaint: Shortness of breath, swelling of leg  HPI: [name] [name] is a 61 y.o. male with medical history significant for HFrEF, CAD, a-fib, COPD, HTN, DMT2, OSA, tobacco use who presents for evaluation of shortness of breath and swelling.  He was seen 3 days ago at the CHF clinic and at that time he looked to be fluid overloaded and had soft blood pressure and was referred to the emergency room for further evaluation and admission but he refused at that time.  He lives with his girlfriend.  He has had increasing shortness of breath and edema of his left lower leg over the last week.  Symptoms have worsened in the last few days.  Reports that "he feels like crap" and is having generalized weakness and fatigue.  He has increased shortness of breath with minimal exertion.  Swelling in his left leg has increased.  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  Has any fever, chills, abdominal pain, nausea vomiting diarrhea, urinary symptoms.  He does have intermittent chest pain and palpitations but has not been worse than normal he is in the last few days.  He is on digoxin, Plavix, Eliquis at home and states he has been taking it.  Is not clear if he has been taking his diuretic at home.  Continues to smoke and has increased to 2 packs a day from 1 pack a day in the last 3 to 4 weeks.  Denies alcohol or illicit drug use.  ED Course: In the emergency room he has had soft blood pressure and was given dose of midodrine.  He has elevated BNP and elevated troponin level.  He has increase in his creatinine and BUN with mild AKI.  He was admitted a few weeks ago for similar symptoms and at that time he had cardiogenic shock.  He had an echocardiogram on June 15, 2021 that showed an EF of less than 20% with global hypokinesis and decreased LV and RV function.  January 8 he had a  hemoglobin A1c that was normal at 6.1.  His blood sugars have been good over the last few weeks he reports.  COVID and influenza were negative.  Wound had decreased sodium level of 123.  Creatinine 1.55 and BUN of 39.  Creatinine has been normal over the past month.  BNP 1987.6.  Initial troponin was 71.  Repeat troponin was 78.  A few weeks ago troponin was 182-360.  Patient was given Lasix in the emergency room and was given Solu-Medrol for COPD exacerbation.  Hospitalist service asked admit for further management  Review of Systems:  General: Denies fever, chills, weight loss, night sweats. Denies dizziness. Denies change in appetite HENT: Denies head trauma, headache, denies change in hearing, tinnitus. Denies nasal congestion or bleeding.  Denies sore throat,  Denies difficulty swallowing Eyes: Denies blurry vision, pain in eye, drainage.  Denies discoloration of eyes. Neck: Denies pain.  Denies swelling.  Denies pain with movement. Cardiovascular: Reports intermittent chest pain, palpitations. Reports edema and orthopnea Respiratory: Reports shortness of breath.  Denies cough Gastrointestinal: Denies abdominal pain, swelling.  Denies nausea, vomiting, diarrhea.  Denies melena.  Musculoskeletal: Denies limitation of movement. Denies myalgias. Genitourinary: Denies pelvic pain.  Denies urinary frequency or hesitancy.  Denies dysuria.  Skin: Denies rash.  Denies petechiae, purpura, ecchymosis. Neurological: Denies syncope. Denies  seizure activity. Denies paresthesia. Denies slurred speech, drooping face.  Denies visual change. Psychiatric: Denies depression, anxiety. Denies hallucinations.  Past Medical History:  Diagnosis Date   Acute on chronic heart failure ([place])    AKI (acute kidney injury) ([place]) 01/03/2018   Angina at rest [place] [facility]) 08/13/2017   Arthritis    Asthma    Atherosclerosis    BPH (benign prostatic hyperplasia)    Carotid arterial disease ([place])    Cellulitis of foot, left  09/27/2019   Charcot's joint of foot, right    Chronic combined systolic (congestive) and diastolic (congestive) heart failure ([place])    a. 02/2013 EF 50% by LV gram; b. 04/2017 Echo: EF 25-30%. diff HK. Gr2 DD; c. 06/2020 Echo: EF <20%, glob HK. RVS 53.38mmHg. Sev BAE. Mild to mod TR.   Chronic foot pain (Secondary Area of Pain) (Right) 01/29/2018   COPD (chronic obstructive pulmonary disease) (HCC)    COPD with acute exacerbation ([place]) 01/30/2020   Coronary artery disease    a. 2013 S/P PCI of LAD [facility]);  b. 03/2013 PCI: RCA 90p ( 2.5 x 23 mm DES); c. 02/2014 Cath: patent RCA stent->Med Rx; d. 04/2017 Cath: LM nl, LAd 30ost/p, 41m/d, D1 80ost, LCX 90p/m, OM1 85, RCA 70/20p, 31m, 70d->Referred for CT Surg-felt to be poor candidate; d. 01/2020 Cath: LM nl, LAD 20p/m, 60d. D1 80. LCX 90p/m. LPAV 95, RCA 80/20p, 80p/m.   Diabetic neuropathy ([place])    Diabetic ulcer of right foot ([place])    Hernia    HFrEF (heart failure with reduced ejection fraction) ([place]) 01/30/2020   Hyperlipidemia    Hypertension    Ischemic cardiomyopathy    a. 02/2013 EF 50% by LV gram; b. 04/2017 Echo: EF 25-30%. diff HK. Gr2 DD; c. 06/2020 Echo: EF <20%, glob HK. RVS 53.75mmHg. Sev BAE. Mild to mod TR.   Morbid obesity ([place])    Neuropathy    PAD (peripheral artery disease) ([place])    a. Followed by Dr. Lucky Cowboy; b. 08/2010 Periph Angio: RSFA 70-80p (6X60 self-expanding stent); c. 09/2010 Periph Angio: L SFA 100p (7x unknown length self-expanding stent); d. 06/2015 Periph Angio: R SFA short segment occlusion (6x12 self-expanding stent).   Persistent atrial fibrillation (HCC)    Restless leg syndrome    Sepsis ([place]) 03/30/2018   Sleep apnea    Subclavian artery stenosis, right (HCC)    Syncope and collapse    Tobacco use    a. 75+ yr hx - still smoking 1ppd, down from 2 ppd.   Type II diabetes mellitus (HCC)    Unstable angina ([place]) 07/11/2016   Varicose vein     Past Surgical History:  Procedure Laterality Date   ABDOMINAL  AORTAGRAM N/A 06/23/2013   Procedure: ABDOMINAL Maxcine Ham;  Surgeon: Wellington Hampshire, MD;  Location: [place] CATH LAB;  Service: Cardiovascular;  Laterality: N/A;   AMPUTATION Right 04/01/2018   Procedure: AMPUTATION BELOW KNEE;  Surgeon: Algernon Huxley, MD;  Location: [facility] ORS;  Service: Vascular;  Laterality: Right;   AMPUTATION TOE Left 08/11/2020   Procedure: AMPUTATION TOE-2nd Toe;  Surgeon: Samara Deist, DPM;  Location: [facility] ORS;  Service: Podiatry;  Laterality: Left;   APPENDECTOMY     CARDIAC CATHETERIZATION  10/14   [facility]; X1 STENT PROXIMAL RCA   CARDIAC CATHETERIZATION  02/2010   [facility]   CARDIAC CAT[facility]IZATION  [facility]/21/2014   [facility]   CLAVICLE SURGERY     HERNIA REPAIR     IRRIGATION AND DEBRIDEMENT FOOT Right 01/04/2018  Procedure: IRRIGATION AND DEBRIDEMENT FOOT;  Surgeon: Samara Deist, DPM;  Location: [facility] ORS;  Service: Podiatry;  Laterality: Right;   LEFT HEART CATH AND CORONARY ANGIOGRAPHY N/A 01/20/2020   Procedure: LEFT HEART CATH AND CORONARY ANGIOGRAPHY;  Surgeon: Wellington Hampshire, MD;  Location: [place] CV LAB;  Service: Cardiovascular;  Laterality: N/A;   LEFT HEART CATH AND CORS/GRAFTS ANGIOGRAPHY N/A 04/28/2017   Procedure: LEFT HEART CATH AND CORONARY ANGIOGRAPHY;  Surgeon: Wellington Hampshire, MD;  Location: [place] CV LAB;  Service: Cardiovascular;  Laterality: N/A;   LOWER EXTREMITY ANGIOGRAPHY Right 03/03/2018   Procedure: LOWER EXTREMITY ANGIOGRAPHY;  Surgeon: Katha Cabal, MD;  Location: [place] CV LAB;  Service: Cardiovascular;  Laterality: Right;   LOWER EXTREMITY ANGIOGRAPHY Left 08/24/2018   Procedure: LOWER EXTREMITY ANGIOGRAPHY;  Surgeon: Algernon Huxley, MD;  Location: [place] CV LAB;  Service: Cardiovascular;  Laterality: Left;   LOWER EXTREMITY ANGIOGRAPHY Left 06/14/2019   Procedure: LOWER EXTREMITY ANGIOGRAPHY;  Surgeon: Algernon Huxley, MD;  Location: [place] CV LAB;  Service: Cardiovascular;  Laterality: Left;   LOWER EXTREMITY  ANGIOGRAPHY Left 08/02/2019   Procedure: LOWER EXTREMITY ANGIOGRAPHY;  Surgeon: Algernon Huxley, MD;  Location: [place] CV LAB;  Service: Cardiovascular;  Laterality: Left;   LOWER EXTREMITY ANGIOGRAPHY Left 07/17/2020   Procedure: LOWER EXTREMITY ANGIOGRAPHY;  Surgeon: Algernon Huxley, MD;  Location: [place] CV LAB;  Service: Cardiovascular;  Laterality: Left;   PERIPHERAL ARTERIAL STENT GRAFT     x2 left/right   PERIPHERAL VASCULAR BALLOON ANGIOPLASTY Right 01/06/2018   Procedure: PERIPHERAL VASCULAR BALLOON ANGIOPLASTY;  Surgeon: Katha Cabal, MD;  Location: [place] CV LAB;  Service: Cardiovascular;  Laterality: Right;   PERIPHERAL VASCULAR CATHETERIZATION N/A 07/06/2015   Procedure: Abdominal Aortogram w/Lower Extremity;  Surgeon: Algernon Huxley, MD;  Location: [place] CV LAB;  Service: Cardiovascular;  Laterality: N/A;   PERIPHERAL VASCULAR CATHETERIZATION  07/06/2015   Procedure: Lower Extremity Intervention;  Surgeon: Algernon Huxley, MD;  Location: [place] CV LAB;  Service: Cardiovascular;;    Social History  reports that he has been smoking cigarettes. He has a 41.00 pack-year smoking history. He has never used smokeless tobacco. He reports that he does not drink alcohol and does not use drugs.  Allergies  Allergen Reactions   Dulaglutide Anaphylaxis, Diarrhea and Hives   Ace Inhibitors     hypotension   Other Itching    Skin itching associated with nitro patch   Prednisone Rash    Family History  Problem Relation Age of Onset   Heart attack Father    Heart disease Father    Hypertension Father    Hyperlipidemia Father    Hypertension Mother    Hyperlipidemia Mother      Prior to Admission medications   Medication Sig Start Date End Date Taking? Authorizing Provider  albuterol (VENTOLIN HFA) 108 (90 Base) MCG/ACT inhaler Inhale into the lungs every 6 (six) hours as needed for wheezing or shortness of breath.   Yes [provider]  apixaban  (ELIQUIS) 5 MG TABS tablet Take 5 mg by mouth 2 (two) times daily.   Yes [provider]  atorvastatin (LIPITOR) 80 MG tablet TAKE 1 TABLET BY MOUTH ONCE DAILY 06/12/21  Yes Gollan, Kathlene November, MD  carvedilol (COREG) 3.125 MG tablet TAKE 1 TABLET BY MOUTH TWICE DAILY WITH MEALS 06/12/21  Yes Gollan, Kathlene November, MD  clopidogrel (PLAVIX) 75 MG tablet TAKE 1 TABLET BY  MOUTH ONCE DAILY 06/12/21  Yes Gollan, Kathlene November, MD  digoxin (LANOXIN) 0.125 MG tablet TAKE 1 TABLET BY MOUTH ONCE DAILY 06/12/21  Yes Gollan, Kathlene November, MD  DULoxetine (CYMBALTA) 60 MG capsule Take 60 mg by mouth daily.   Yes [provider]  ezetimibe (ZETIA) 10 MG tablet Take 1 tablet (10 mg total) by mouth daily. 10/09/20  Yes Minna Merritts, MD  gabapentin (NEURONTIN) 800 MG tablet Take 800 mg by mouth 4 (four) times daily.  08/14/17 04/04/29 Yes [provider]  ipratropium-albuterol (DUONEB) 0.5-2.5 (3) MG/3ML SOLN Inhale 3 mLs into the lungs every 6 (six) hours as needed (wheezing/sob).    Yes [provider]  losartan (COZAAR) 25 MG tablet Take 0.5 tablets (12.5 mg total) by mouth daily. 10/09/20  Yes Minna Merritts, MD  metFORMIN (GLUCOPHAGE-XR) 500 MG 24 hr tablet Take 1,000 mg by mouth 2 (two) times daily. 01/02/21  Yes [provider]  Multiple Vitamin (MULTI-VITAMINS) TABS Take 1 tablet by mouth daily.    Yes [provider]  omeprazole (PRILOSEC) 40 MG capsule Take 40 mg by mouth daily.   Yes [provider]  Oxycodone HCl 10 MG TABS Take 10 mg by mouth every 4 (four) hours as needed (pain).   Yes [provider]  polyethylene glycol (MIRALAX / GLYCOLAX) 17 g packet Take 17 g by mouth daily as needed for mild constipation. 08/12/20  Yes Wieting, Richard, MD  potassium chloride (KLOR-CON) 10 MEQ tablet TAKE 1 TABLET BY MOUTH TWICE DAILY 06/12/21  Yes Gollan, Kathlene November, MD  promethazine (PHENERGAN) 25 MG tablet Take 25 mg by mouth every 8 (eight) hours as needed.  04/02/21  Yes [provider]  spironolactone (ALDACTONE) 25 MG tablet Take 0.5 tablets (12.5 mg total) by mouth daily. 10/09/20  Yes Minna Merritts, MD  tamsulosin (FLOMAX) 0.4 MG CAPS capsule Take 1 capsule (0.4 mg total) by mouth daily after supper. 07/01/20  Yes Shawna Clamp, MD  traZODone (DESYREL) 150 MG tablet Take 150 mg by mouth at bedtime. 07/12/19  Yes [provider]  VERQUVO 10 MG TABS TAKE 1 TABLET BY MOUTH ONCE DAILY 11/16/20  Yes Alisa Graff, FNP  Fluticasone-Umeclidin-Vilant 100-62.5-25 MCG/INH AEPB Inhale 1 puff into the lungs daily.    [provider]  nitroGLYCERIN (NITROSTAT) 0.4 MG SL tablet Place 1 tablet (0.4 mg total) under the tongue every 5 (five) minutes as needed for chest pain. 10/09/20   Minna Merritts, MD  torsemide 40 MG TABS Take 40 mg by mouth 2 (two) times daily. Take 40 mg by mouth twice daily. 03/30/21 07/11/21  Sidney Ace, MD    Physical Exam: Vitals:   07/15/21 1657 07/15/21 1700 07/15/21 1730 07/15/21 1800  BP: 92/61 (!) 94/57 (!) 106/54 (!) 94/43  Pulse: (!) 120   64  Resp: 19 13 10 14   Temp:      TempSrc:      SpO2: 99%   94%  Weight:      Height:        Constitutional: NAD, calm, comfortable Vitals:   07/15/21 1657 07/15/21 1700 07/15/21 1730 07/15/21 1800  BP: 92/61 (!) 94/57 (!) 106/54 (!) 94/43  Pulse: (!) 120   64  Resp: 19 13 10 14   Temp:      TempSrc:      SpO2: 99%   94%  Weight:      Height:       General: WDWN, Alert  and oriented x3.  Eyes: EOMI, PERRL, conjunctivae normal. Sclera nonicteric HENT:  [place]/AT, external ears normal.  Nares patent without epistasis.  Mucous membranes are moist. Posterior pharynx clear of any exudate   Neck: Soft, normal range of motion, supple, no masses, Trachea midline Respiratory: Diminished breath sounds bilaterally with diffuse Rales and mild expiratory wheezing.  Mild bibasilar crackles  Normal respiratory effort. No accessory muscle use.  Cardiovascular:  Irregularly irregular rhythm, normal rate. 3/6 systolic murmur. No rubs / gallops. Has pitting edema Left leg  Abdomen: Soft, no tenderness, nondistended, no rebound or guarding.  No masses palpated. Bowel sounds normoactive Musculoskeletal: FROM. Has clubbing of digits. no cyanosis. No joint deformity upper and lower extremities. Normal muscle tone. Right BKA Skin: Warm, dry, intact no rashes, No induration. Superficial skin tears of left heel and left anterior leg. No vesicles. No erythema.  Neurologic: CN 2-12 grossly intact.  Normal speech.  Sensation intact, Strength 5/5 in upper extremities.   Psychiatric: Normal judgment and insight. Normal mood.    Labs on Admission: I have personally reviewed following labs and imaging studies  CBC: Recent Labs  Lab 07/15/21 1640  WBC 5.6  HGB 9.0*  HCT 28.6*  MCV 80.3  PLT 094    Basic Metabolic Panel: Recent Labs  Lab 07/15/21 1640  NA 123*  K 5.1  CL 90*  CO2 26  GLUCOSE 98  BUN 39*  CREATININE 1.55*  CALCIUM 8.1*    GFR: Estimated Creatinine Clearance: 53.1 mL/min (A) (by C-G formula based on SCr of 1.55 mg/dL (H)).  Liver Function Tests: Recent Labs  Lab 07/15/21 1640  AST 32  ALT 14  ALKPHOS 108  BILITOT 2.9*  PROT 6.5  ALBUMIN 3.2*    Urine analysis:    Component Value Date/Time   COLORURINE STRAW (A) 06/14/2021 2220   APPEARANCEUR CLEAR (A) 06/14/2021 2220   APPEARANCEUR Clear 02/06/2014 1412   LABSPEC 1.006 06/14/2021 2220   LABSPEC 1.015 02/06/2014 1412   PHURINE 5.0 06/14/2021 2220   GLUCOSEU NEGATIVE 06/14/2021 2220   GLUCOSEU Negative 02/06/2014 1412   HGBUR NEGATIVE 06/14/2021 2220   BILIRUBINUR NEGATIVE 06/14/2021 2220   BILIRUBINUR Negative 02/06/2014 1412   KETONESUR NEGATIVE 06/14/2021 2220   PROTEINUR NEGATIVE 06/14/2021 2220   NITRITE NEGATIVE 06/14/2021 2220   LEUKOCYTESUR NEGATIVE 06/14/2021 2220   LEUKOCYTESUR Negative 02/06/2014 1412    Radiological Exams on Admission: DG Chest  Portable 1 View  Result Date: 07/15/2021 CLINICAL DATA:  Shortness of breath, volume overload or COPD. EXAM: PORTABLE CHEST 1 VIEW COMPARISON:  Chest radiograph dated in June 14, 2021. FINDINGS: Heart is enlarged. Atherosclerotic calcification of the aortic arch. Pulmonary vascular congestion without evidence of pulmonary edema or pleural effusion. No focal consolidation. IMPRESSION: 1. Stable cardiomegaly with atherosclerotic calcification of the aortic arch. 2. Mild pulmonary vascular congestion. No evidence of pulmonary edema or infiltrate. Electronically Signed   By: Keane Police D.O.   On: 07/15/2021 16:38    EKG: Independently reviewed.  EKG shows atrial fibrillation with occasional PVCs.  Right bundle branch block present.  No acute ST elevation or depression.  QTc prolonged at 494  Assessment/Plan Principal Problem:   Acute on chronic HFrEF (heart failure with reduced ejection fraction Mr. Marro is admitted to Progressive care unit.  Has hx of poor compliance. Was seen a few days ago at CHF clinic and was fluid overloaded at that time and was recommended to come for admission then but choose not to.  Diuresis  with lasix IV. Monitor I&Os and daily weights.  Check serial troponin levels.  Pt reports he is a full code.  Had echo a few weeks ago that showed EF less than 20% with global hypokinesis and decreased LV and RV function.  Patient does not have an AICD but may benefit from one although it sounds like he did not want 1 when it was offered in the past he states.  Active Problems:   AKI (acute kidney injury) Monitor renal function and electrolytes     COPD with acute exacerbation Given solumedrol in the ER. Pt is oxygenating normally on RA. Given duonebs  Continue Trelogy inhaler    Type 2 diabetes mellitus with hyperlipidemia  Continue metformin. Blood sugars controlled. Last HgbA1c was 6.1 on 06/17/21    Hypotension Pt with soft BP in ER and hx of cardiogenic shock in past. Was  given dose of midodrine in ER. Monitor BP closely. Hold antihypertensives    Permanent atrial fibrillation  Continue digoxin and eliquis    Hyponatremia Decreased sodium level. Monitor electrolytes with fluid restriction and diuresis.  Check serum osmolarity and urine sodium level.    PAD (peripheral artery disease) Chronic. On eliquis    Tobacco use Nicotine patch to prevent withdrawls. Smokes 2 ppd.     Chronic anticoagulation Chronic      Prolonged QT interval Avoid medications which could further prolong QT interval    Below-knee amputation of right lower extremity   CAD (coronary artery disease)   DVT prophylaxis: Pt is anticoagulated on Eliquis which is continued.  Code Status:   Full Code.   Family Communication:  Diagnosis and plan discussed with patient.  Patient verbalized understanding agrees with plan.  Further recommendations as clinical indicated Disposition Plan:   Patient is from:  Home   Anticipated DC to:  Home  Anticipated DC date:  Anticipate 2 midnight or more stay in the hospital to treat acute condition  Admission status:  Inpatient  Yevonne Aline Nehemiah Mcfarren MD [facility]  How to contact the [facility] Attending or Consulting provider [place] or covering provider during after hours [place], for this patient?   Check the care team in [facility] and look for a) attending/consulting [facility] provider listed and b) the [facility] team listed Log into [email or web address] and use [place]'s universal password to access. If you do not have the password, please contact the hospital operator. Locate the [facility] provider you are looking for under [facility] and page to a number that you can be directly reached. If you still have difficulty reaching the provider, please page the [facility] (Director on Call) for the Hospitalists listed on amion for assistance.  07/15/2021, 7:01 PM

## 2021-07-15 NOTE — Progress Notes (Deleted)
Cardiology Office Note:    Date:  07/15/2021   ID:  [name] [name], DOB [birth year], MRN [id number]  PCP:  [name], MD  D [facility] Providers Cardiologist:  Ida Rogue, MD { Click to update primary MD,subspecialty MD or APP then REFRESH:1}    Referring MD: [name], MD   No chief complaint on file. ***  History of Present Illness:    [name] [name] is a 61 y.o. 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  He previously underwent PCI of the LAD in 2013 with PCI of the RCA October 2014. Catheterization in November 2018 revealed severe multivessel CAD. He was seen by CT surgery and not felt to be a suitable candidate. Admission 8/21 for NSTEMI with LHC that revealed severe 3 vessel disease, patent proximal LAD stent, moderate distal LAD disease, significant stenosis proximal LCx, bifurcation lesion, heavily calcified, RCA stent patent, proximal RCA disease. His heart failure symptoms and management of a fib felt to be more of priority. Diuresis and pressors were recommended but he left AMA prior to initiaion. Later that month, had admission to [place] for CHF, also left AMA.  History of LV dysfunction with an EF < 20% by echo in January 2022. Had a prolonged hospitalization at that time for significant volume overload and low output heart faiutre. In August 2022, he presented to the ED following a fall 2/2 lightheadedness. He was initially given IV Lasix. Hs troponin was elevated at 299, he was hypoxic on room air with low to mid 80s and admission was recommended but he declined. He was seen 2 days later in heart failure clinic with c/o worsening dyspnea on exertion and fatigue and metolazone 2.5 mg daily was added to torsemide  therapy  Last office visit was 07/12/21 in HF clinic and he was volume up. He was advised to go to the ED but he refused. In [facility] ED 2/5 for leg swelling    Past Medical History:  Diagnosis Date   Acute on chronic heart failure ([place])    AKI (acute kidney injury) ([place]) 01/03/2018   Angina at rest [facility]) 08/13/2017   Arthritis    Asthma    Atherosclerosis    BPH (benign prostatic hyperplasia)    Carotid arterial disease ([place])    Cellulitis of foot, left 09/27/2019   Charcot's joint of foot, right    Chronic combined systolic (congestive) and diastolic (congestive) heart failure ([place])    a. 02/2013 EF 50% by LV gram; b. 04/2017 Echo: EF 25-30%. diff HK. Gr2 DD; c. 06/2020 Echo: EF <20%, glob HK. RVS 53.50mmHg. Sev BAE. Mild to mod TR.   Chronic foot pain (Secondary Area of Pain) (Right) 01/29/2018   COPD (chronic obstructive pulmonary disease) (HCC)    COPD with acute exacerbation ([place]) 01/30/2020   Coronary artery disease    a. 2013 S/P PCI of LAD [facility]);  b. 03/2013 PCI: RCA 90p ( 2.5 x 23 mm DES); c. 02/2014 Cath: patent RCA stent->Med Rx; d. 04/2017 Cath: LM nl, LAd 30ost/p, 79m/d, D1 80ost, LCX 90p/m, OM1 85, RCA 70/20p, 67m, 70d->Referred for CT Surg-felt to be poor candidate; d. 01/2020 Cath: LM nl, LAD 20p/m, 60d. D1 80. LCX 90p/m. LPAV 95, RCA 80/20p, 80p/m.   Diabetic neuropathy ([place])  Diabetic ulcer of right foot ([place])    Hernia    HFrEF (heart failure with reduced ejection fraction) ([place]) 01/30/2020   Hyperlipidemia    Hypertension    Ischemic cardiomyopathy    a. 02/2013 EF 50% by LV gram; b. 04/2017 Echo: EF 25-30%. diff HK. Gr2 DD; c. 06/2020 Echo: EF <20%, glob HK. RVS 53.60mmHg. Sev BAE. Mild to mod TR.   Morbid obesity ([place])    Neuropathy    PAD (peripheral artery disease) ([place])    a. Followed by Dr. Lucky Cowboy; b. 08/2010 Periph Angio: RSFA 70-80p (6X60 self-expanding stent); c. 09/2010 Periph Angio: L SFA 100p (7x unknown length self-expanding stent); d. 06/2015 Periph Angio: R SFA  short segment occlusion (6x12 self-expanding stent).   Persistent atrial fibrillation (HCC)    Restless leg syndrome    Sepsis ([place]) 03/30/2018   Sleep apnea    Subclavian artery stenosis, right (HCC)    Syncope and collapse    Tobacco use    a. 75+ yr hx - still smoking 1ppd, down from 2 ppd.   Type II diabetes mellitus (HCC)    Unstable angina ([place]) 07/11/2016   Varicose vein     Past Surgical History:  Procedure Laterality Date   ABDOMINAL AORTAGRAM N/A 06/23/2013   Procedure: ABDOMINAL Maxcine Ham;  Surgeon: Wellington [place], MD;  Location: [place] CATH LAB;  Service: Cardiovascular;  Laterality: N/A;   AMPUTATION Right 04/01/2018   Procedure: AMPUTATION BELOW KNEE;  Surgeon: Algernon Huxley, MD;  Location: [facility] ORS;  Service: Vascular;  Laterality: Right;   AMPUTATION TOE Left 08/11/2020   Procedure: AMPUTATION TOE-2nd Toe;  Surgeon: Samara Deist, DPM;  Location: [facility] ORS;  Service: Podiatry;  Laterality: Left;   APPENDECTOMY     CARDIAC CATHETERIZATION  10/14   [facility]; X1 STENT PROXIMAL RCA   CARDIAC CATHETERIZATION  02/2010   [facility]   CARDIAC CATHETERIZATION  02/21/2014   [facility]   CLAVICLE SURGERY     HERNIA REPAIR     IRRIGATION AND DEBRIDEMENT FOOT Right 01/04/2018   Procedure: IRRIGATION AND DEBRIDEMENT FOOT;  Surgeon: Samara Deist, DPM;  Location: [facility] ORS;  Service: Podiatry;  Laterality: Right;   LEFT HEART CATH AND CORONARY ANGIOGRAPHY N/A 01/20/2020   Procedure: LEFT HEART CATH AND CORONARY ANGIOGRAPHY;  Surgeon: Wellington [place], MD;  Location: [place] CV LAB;  Service: Cardiovascular;  Laterality: N/A;   LEFT HEART CATH AND CORS/GRAFTS ANGIOGRAPHY N/A 04/28/2017   Procedure: LEFT HEART CATH AND CORONARY ANGIOGRAPHY;  Surgeon: Wellington [place], MD;  Location: [place] CV LAB;  Service: Cardiovascular;  Laterality: N/A;   LOWER EXTREMITY ANGIOGRAPHY Right 03/03/2018   Procedure: LOWER EXTREMITY ANGIOGRAPHY;  Surgeon: Katha Cabal, MD;  Location: [place] CV  LAB;  Service: Cardiovascular;  Laterality: Right;   LOWER EXTREMITY ANGIOGRAPHY Left 08/24/2018   Procedure: LOWER EXTREMITY ANGIOGRAPHY;  Surgeon: Algernon Huxley, MD;  Location: [place] CV LAB;  Service: Cardiovascular;  Laterality: Left;   LOWER EXTREMITY ANGIOGRAPHY Left 06/14/2019   Procedure: LOWER EXTREMITY ANGIOGRAPHY;  Surgeon: Algernon Huxley, MD;  Location: [place] CV LAB;  Service: Cardiovascular;  Laterality: Left;   LOWER EXTREMITY ANGIOGRAPHY Left 08/02/2019   Procedure: LOWER EXTREMITY ANGIOGRAPHY;  Surgeon: Algernon Huxley, MD;  Location: [place] CV LAB;  Service: Cardiovascular;  Laterality: Left;   LOWER EXTREMITY ANGIOGRAPHY Left 07/17/2020   Procedure: LOWER EXTREMITY ANGIOGRAPHY;  Surgeon: Algernon Huxley, MD;  Location: [place] CV LAB;  Service: Cardiovascular;  Laterality: Left;   PERIPHERAL ARTERIAL STENT GRAFT     x2 left/right   PERIPHERAL VASCULAR BALLOON ANGIOPLASTY Right 01/06/2018   Procedure: PERIPHERAL VASCULAR BALLOON ANGIOPLASTY;  Surgeon: Katha Cabal, MD;  Location: [place] CV LAB;  Service: Cardiovascular;  Laterality: Right;   PERIPHERAL VASCULAR CATHETERIZATION N/A 07/06/2015   Procedure: Abdominal Aortogram w/Lower Extremity;  Surgeon: Algernon Huxley, MD;  Location: [place] CV LAB;  Service: Cardiovascular;  Laterality: N/A;   PERIPHERAL VASCULAR CATHETERIZATION  07/06/2015   Procedure: Lower Extremity Intervention;  Surgeon: Algernon Huxley, MD;  Location: [place] CV LAB;  Service: Cardiovascular;;    Current Medications: No outpatient medications have been marked as taking for the 07/16/21 encounter (Appointment) with Rise Mu, PA-C.     Allergies:   Dulaglutide, Ace inhibitors, Other, and Prednisone   Social History   Socioeconomic History   Marital status: Significant Other    Spouse name: Not on file   Number of children: 0   Years of education: 45   Highest education level: 12th grade  Occupational History    Occupation: on disability  Tobacco Use   Smoking status: Every Day    Packs/day: 1.00    Years: 41.00    Pack years: 41.00    Types: Cigarettes   Smokeless tobacco: Never  Vaping Use   Vaping Use: Never used  Substance and Sexual Activity   Alcohol use: No   Drug use: No   Sexual activity: Yes  Other Topics Concern   Not on file  Social History Narrative   Lives at home in [place] with girlfriend. Independent at baseline.   Social Determinants of Health   Financial Resource Strain: Not on file  Food Insecurity: Not on file  Transportation Needs: Not on file  Physical Activity: Not on file  Stress: Not on file  Social Connections: Not on file     Family History: The patient's ***family history includes Heart attack in his father; Heart disease in his father; Hyperlipidemia in his father and mother; Hypertension in his father and mother.  ROS:   Please see the history of present illness.    *** All other systems reviewed and are negative.  Labs/Other Studies Reviewed:    The following studies were reviewed today:  Echo 06/15/21  Left Ventricle: Left ventricular ejection fraction, by estimation, is  <20%. The left ventricle has severely decreased function. The left  ventricle demonstrates global hypokinesis. Definity contrast agent was  given IV to delineate the left ventricular endocardial borders. The left ventricular internal cavity size was severely dilated. There is no left ventricular hypertrophy. Left ventricular diastolic parameters are indeterminate.  Right Ventricle: The right ventricular size is moderately enlarged. No  increase in right ventricular wall thickness. Right ventricular systolic  function is severely reduced. There is severely elevated pulmonary artery  systolic pressure. The tricuspid regurgitant velocity is 3.84 m/s, and with an assumed right atrial pressure of 15 mmHg, the estimated right ventricular systolic pressure is 18.8 mmHg.  Left  Atrium: Left atrial size was normal in size.  Right Atrium: Right atrial size was normal in size.  Pericardium: There is no evidence of pericardial effusion.  Mitral Valve: The mitral valve is normal in structure. No evidence of  mitral valve regurgitation. No evidence of mitral valve stenosis. MV peak  gradient, 5.9 mmHg. The mean mitral valve gradient is 2.0 mmHg.  Tricuspid Valve: The tricuspid valve is normal in structure. Tricuspid  valve regurgitation  is mild . No evidence of tricuspid stenosis.  Aortic Valve: The aortic valve is normal in structure. Aortic valve  regurgitation is not visualized. Aortic valve sclerosis/calcification is  present, without any evidence of aortic stenosis. Aortic valve mean  gradient measures 7.0 mmHg. Aortic valve peak gradient measures 12.5 mmHg. Aortic valve area, by VTI measures 1.97 cm.  Pulmonic Valve: The pulmonic valve was normal in structure. Pulmonic valve  regurgitation is not visualized. No evidence of pulmonic stenosis.  Aorta: The aortic root is normal in size and structure.  Venous: The pulmonary veins were not well visualized. The inferior vena  cava is dilated in size with less than 50% respiratory variability,  suggesting right atrial pressure of 15 mmHg.  IAS/Shunts: No atrial level shunt detected by color flow Doppler.    LHC 8/21  Prox RCA-1 lesion is 80% stenosed. Prox RCA-2 lesion is 20% stenosed. Prox RCA to Mid RCA lesion is 80% stenosed. Prox LAD lesion is 20% stenosed. 1st Diag lesion is 80% stenosed. Mid LAD lesion is 20% stenosed. Dist LAD lesion is 60% stenosed. Prox Cx to Mid Cx lesion is 90% stenosed. LPAV lesion is 95% stenosed.   1. Significant underlying three-vessel coronary artery disease. Patent proximal LAD stent with mild in-stent restenosis. Moderate diffuse disease in the distal LAD. Significant stenosis in the proximal left circumflex at the origin of the posterior AV groove artery which is also heavily  diseased at the ostium. This is a bifurcation lesion and heavily calcified. RCA stent is patent. However, there is significant proximal disease in the whole mid to distal segment is diffusely diseased and small caliber. 2. Left ventricular angiography was not performed. EF was severe reduced by echo. 3. Moderately elevated left ventricular end-diastolic pressure at 28 mmHg   Recommendations: The patient's cardiomyopathy seems to be out of proportion to his coronary artery disease as the LAD itself does not seem to have obstructive disease at the present time. The RCA is diffusely diseased and too small to stent. The only target for revascularization would be the left circumflex but it is a calcified bifurcation lesion and likely requires atherectomy. In order to do this safely, the patient has to be optimized from a heart failure standpoint as he appears to be volume overloaded. Recommend intravenous diuresis. If limited by hypotension, we might need to consider inotropic support and small dose norepinephrine drip initially. I added small dose digoxin. Treat underlying atrial fibrillation. Resume heparin drip 8 hours after sheath pull.    Recent Labs: 06/14/2021: ALT 20; TSH 2.617 06/17/2021: Hemoglobin 9.7; Platelets 181 06/19/2021: Magnesium 2.0 07/06/2021: B Natriuretic Peptide 1,932.1; BUN 29; Creatinine, Ser 1.23; Potassium 4.8; Sodium 132  Recent Lipid Panel    Component Value Date/Time   CHOL 68 03/19/2021 0505   CHOL 211 (H) 04/05/2013 0520   TRIG 36 03/19/2021 0505   TRIG 321 (H) 04/05/2013 0520   HDL 34 (L) 03/19/2021 0505   HDL 34 (L) 04/05/2013 0520   CHOLHDL 2.0 03/19/2021 0505   VLDL 7 03/19/2021 0505   VLDL 64 (H) 04/05/2013 0520   LDLCALC 27 03/19/2021 0505   LDLCALC 113 (H) 04/05/2013 0520     Risk Assessment/Calculations:   {Does this patient have ATRIAL FIBRILLATION?:[phone]}       Physical Exam:    VS:  There were no vitals taken for this visit.    Wt  Readings from Last 3 Encounters:  06/18/21 182 lb 15.7 oz (83 kg)  05/27/21 160 lb (72.6  kg)  05/22/21 181 lb (82.1 kg)     GEN: *** Well nourished, well developed in no acute distress HEENT: Normal NECK: No JVD; No carotid bruits CARDIAC: ***RRR, no murmurs, rubs, gallops RESPIRATORY:  Clear to auscultation without rales, wheezing or rhonchi  ABDOMEN: Soft, non-tender, non-distended MUSCULOSKELETAL:  No edema; No deformity. *** pedal pulses, ***bilaterally SKIN: Warm and dry NEUROLOGIC:  Alert and oriented x 3 PSYCHIATRIC:  Normal affect   EKG:  EKG is *** ordered today.  The ekg ordered today demonstrates ***  Diagnoses:    No diagnosis found. Assessment and Plan:     Chronic combined heart failure NYHA Class III   {Are you ordering a CV Procedure (e.g. stress test, cath, DCCV, TEE, etc)?   Press F2        :[id number]}    Medication Adjustments/Labs and Tests Ordered: Current medicines are reviewed at length with the patient today.  Concerns regarding medicines are outlined above.  No orders of the defined types were placed in this encounter.  No orders of the defined types were placed in this encounter.   There are no Patient Instructions on file for this visit.   Signed, Emmaline Life, NP  07/15/2021 8:27 AM    [facility]

## 2021-07-15 NOTE — ED Notes (Signed)
RN to bedside

## 2021-07-16 ENCOUNTER — Ambulatory Visit: Payer: Medicare Other | Admitting: Physician Assistant

## 2021-07-16 DIAGNOSIS — J441 Chronic obstructive pulmonary disease with (acute) exacerbation: Secondary | ICD-10-CM

## 2021-07-16 DIAGNOSIS — I5023 Acute on chronic systolic (congestive) heart failure: Secondary | ICD-10-CM | POA: Diagnosis not present

## 2021-07-16 DIAGNOSIS — I4821 Permanent atrial fibrillation: Secondary | ICD-10-CM

## 2021-07-16 LAB — CBC
HCT: 28.2 % — ABNORMAL LOW (ref 39.0–52.0)
Hemoglobin: 9.1 g/dL — ABNORMAL LOW (ref 13.0–17.0)
MCH: 25.1 pg — ABNORMAL LOW (ref 26.0–34.0)
MCHC: 32.3 g/dL (ref 30.0–36.0)
MCV: 77.7 fL — ABNORMAL LOW (ref 80.0–100.0)
Platelets: 149 10*3/uL — ABNORMAL LOW (ref 150–400)
RBC: 3.63 MIL/uL — ABNORMAL LOW (ref 4.22–5.81)
RDW: 20.2 % — ABNORMAL HIGH (ref 11.5–15.5)
WBC: 3.9 10*3/uL — ABNORMAL LOW (ref 4.0–10.5)
nRBC: 0 % (ref 0.0–0.2)

## 2021-07-16 LAB — TSH: TSH: 1.909 u[IU]/mL (ref 0.350–4.500)

## 2021-07-16 LAB — COMPREHENSIVE METABOLIC PANEL
ALT: 14 U/L (ref 0–44)
AST: 25 U/L (ref 15–41)
Albumin: 3.3 g/dL — ABNORMAL LOW (ref 3.5–5.0)
Alkaline Phosphatase: 108 U/L (ref 38–126)
Anion gap: 8 (ref 5–15)
BUN: 45 mg/dL — ABNORMAL HIGH (ref 6–20)
CO2: 26 mmol/L (ref 22–32)
Calcium: 8.6 mg/dL — ABNORMAL LOW (ref 8.9–10.3)
Chloride: 92 mmol/L — ABNORMAL LOW (ref 98–111)
Creatinine, Ser: 1.71 mg/dL — ABNORMAL HIGH (ref 0.61–1.24)
GFR, Estimated: 46 mL/min — ABNORMAL LOW (ref >60)
Glucose, Bld: 127 mg/dL — ABNORMAL HIGH (ref 70–99)
Potassium: 5.2 mmol/L — ABNORMAL HIGH (ref 3.5–5.1)
Sodium: 126 mmol/L — ABNORMAL LOW (ref 135–145)
Total Bilirubin: 2.3 mg/dL — ABNORMAL HIGH (ref 0.3–1.2)
Total Protein: 6.1 g/dL — ABNORMAL LOW (ref 6.5–8.1)

## 2021-07-16 LAB — PROCALCITONIN: Procalcitonin: 0.11 ng/mL

## 2021-07-16 LAB — MAGNESIUM: Magnesium: 2.3 mg/dL (ref 1.7–2.4)

## 2021-07-16 MED ORDER — OXYCODONE HCL 5 MG PO TABS
10.0000 mg | ORAL_TABLET | Freq: Four times a day (QID) | ORAL | Status: DC | PRN
  Administered 2021-07-16 – 2021-07-21 (×11): 10 mg via ORAL
  Filled 2021-07-16 (×10): qty 2

## 2021-07-16 MED ORDER — PREDNISONE 10 MG PO TABS
40.0000 mg | ORAL_TABLET | Freq: Every day | ORAL | Status: AC
  Administered 2021-07-16 – 2021-07-19 (×4): 40 mg via ORAL
  Filled 2021-07-16: qty 4
  Filled 2021-07-16: qty 2
  Filled 2021-07-16: qty 4
  Filled 2021-07-16: qty 2

## 2021-07-16 MED ORDER — GABAPENTIN 400 MG PO CAPS
800.0000 mg | ORAL_CAPSULE | Freq: Four times a day (QID) | ORAL | Status: DC
  Administered 2021-07-16 – 2021-07-20 (×20): 800 mg via ORAL
  Filled 2021-07-16 (×20): qty 2

## 2021-07-16 MED ORDER — IPRATROPIUM-ALBUTEROL 0.5-2.5 (3) MG/3ML IN SOLN
6.0000 mL | Freq: Four times a day (QID) | RESPIRATORY_TRACT | Status: DC
  Administered 2021-07-16 – 2021-07-17 (×2): 6 mL via RESPIRATORY_TRACT
  Filled 2021-07-16 (×3): qty 3

## 2021-07-16 NOTE — Consult Note (Signed)
PODIATRY / FOOT AND ANKLE SURGERY CONSULTATION NOTE  Requesting Physician: Dr. Billie Ruddy  Reason for consult: L foot wounds  Chief Complaint: L foot and ankle/leg wounds   HPI: CELSO GRANJA is a 61 y.o. male who presented to [facility] due to increased lower leg swelling on the left side as well as shortness of breath and nonproductive cough.  Patient was admitted for further work-up.  Patient was noted to have wounds to the left lower extremity and does see Dr. Vickki Muff for this.  Patient also has a history of right below-knee amputation as well as significant vascular work-up [facility].  Podiatry team was consulted for further evaluation of left lower extremity wounds.  PMHx:  Past Medical History:  Diagnosis Date   Acute on chronic heart failure ([place])    AKI (acute kidney injury) ([place]) 01/03/2018   Angina at rest [facility]) 08/13/2017   Arthritis    Asthma    Atherosclerosis    BPH (benign prostatic hyperplasia)    Carotid arterial disease ([place])    Cellulitis of foot, left 09/27/2019   Charcot's joint of foot, right    Chronic combined systolic (congestive) and diastolic (congestive) heart failure ([place])    a. 02/2013 EF 50% by LV gram; b. 04/2017 Echo: EF 25-30%. diff HK. Gr2 DD; c. 06/2020 Echo: EF <20%, glob HK. RVS 53.36mmHg. Sev BAE. Mild to mod TR.   Chronic foot pain (Secondary Area of Pain) (Right) 01/29/2018   COPD (chronic obstructive pulmonary disease) (HCC)    COPD with acute exacerbation ([place]) 01/30/2020   Coronary artery disease    a. 2013 S/P PCI of LAD [facility]);  b. 03/2013 PCI: RCA 90p ( 2.5 x 23 mm DES); c. 02/2014 Cath: patent RCA stent->Med Rx; d. 04/2017 Cath: LM nl, LAd 30ost/p, 61m/d, D1 80ost, LCX 90p/m, OM1 85, RCA 70/20p, 70m, 70d->Referred for CT Surg-felt to be poor candidate; d. 01/2020 Cath: LM nl, LAD 20p/m, 60d. D1 80. LCX 90p/m. LPAV 95, RCA 80/20p, 80p/m.   Diabetic neuropathy ([place])    Diabetic ulcer of right foot ([place])    Hernia    HFrEF (heart failure  with reduced ejection fraction) ([place]) 01/30/2020   Hyperlipidemia    Hypertension    Ischemic cardiomyopathy    a. 02/2013 EF 50% by LV gram; b. 04/2017 Echo: EF 25-30%. diff HK. Gr2 DD; c. 06/2020 Echo: EF <20%, glob HK. RVS 53.30mmHg. Sev BAE. Mild to mod TR.   Morbid obesity ([place])    Neuropathy    PAD (peripheral artery disease) ([place])    a. Followed by Dr. Lucky Cowboy; b. 08/2010 Periph Angio: RSFA 70-80p (6X60 self-expanding stent); c. 09/2010 Periph Angio: L SFA 100p (7x unknown length self-expanding stent); d. 06/2015 Periph Angio: R SFA short segment occlusion (6x12 self-expanding stent).   Persistent atrial fibrillation (HCC)    Restless leg syndrome    Sepsis ([place]) 03/30/2018   Sleep apnea    Subclavian artery stenosis, right (HCC)    Syncope and collapse    Tobacco use    a. 75+ yr hx - still smoking 1ppd, down from 2 ppd.   Type II diabetes mellitus (HCC)    Unstable angina ([place]) 07/11/2016   Varicose vein     Surgical Hx:  Past Surgical History:  Procedure Laterality Date   ABDOMINAL AORTAGRAM N/A 06/23/2013   Procedure: ABDOMINAL Maxcine [place];  Surgeon: Wellington Hampshire, MD;  Location: [facility];  Service: Cardiovascular;  Laterality: N/A;   AMPUTATION Right  04/01/2018   Procedure: AMPUTATION BELOW KNEE;  Surgeon: Algernon Huxley, MD;  Location: [facility] ORS;  Service: Vascular;  Laterality: Right;   AMPUTATION TOE Left 08/11/2020   Procedure: AMPUTATION TOE-2nd Toe;  Surgeon: Samara Deist, DPM;  Location: [facility] ORS;  Service: Podiatry;  Laterality: Left;   APPENDECTOMY     CARDIAC CATHETERIZATION  10/14   [place]; X1 STENT PROXIMAL RCA   CARDIAC CATHETERIZATION  02/2010   [facility]   CARDIAC [facility]  [facility]/2014   [facility]   CLAVICLE SURGERY     HERNIA REPAIR     IRRIGATION AND DEBRIDEMENT FOOT Right 01/04/2018   Procedure: IRRIGATION AND DEBRIDEMENT FOOT;  Surgeon: Samara Deist, DPM;  Location: [facility] ORS;  Service: Podiatry;  Laterality: Right;   LEFT HEART CATH AND CORONARY ANGIOGRAPHY N/A  01/20/2020   Procedure: LEFT HEART CATH AND CORONARY ANGIOGRAPHY;  Surgeon: Wellington Hampshire, MD;  Location: [facility];  Service: Cardiovascular;  Laterality: N/A;   LEFT HEART CATH AND CORS/GRAFTS ANGIOGRAPHY N/A 04/28/2017   Procedure: LEFT HEART CATH AND CORONARY ANGIOGRAPHY;  Surgeon: Wellington Hampshire, MD;  Location: [place] [facility];  Service: Cardiovascular;  Laterality: N/A;   LOWER EXTREMITY ANGIOGRAPHY Right 03/03/2018   Procedure: LOWER EXTREMITY ANGIOGRAPHY;  Surgeon: Katha Cabal, MD;  Location: [place] [facility];  Service: Cardiovascular;  Laterality: Right;   LOWER EXTREMITY ANGIOGRAPHY Left 08/24/2018   Procedure: LOWER EXTREMITY ANGIOGRAPHY;  Surgeon: Algernon Huxley, MD;  Location: [place] [facility];  Service: Cardiovascular;  Laterality: Left;   LOWER EXTREMITY ANGIOGRAPHY Left 06/14/2019   Procedure: LOWER EXTREMITY ANGIOGRAPHY;  Surgeon: Algernon Huxley, MD;  Location: [place] [facility];  Service: Cardiovascular;  Laterality: Left;   LOWER EXTREMITY ANGIOGRAPHY Left 08/02/2019   Procedure: LOWER EXTREMITY ANGIOGRAPHY;  Surgeon: Algernon Huxley, MD;  Location: [place] [facility];  Service: Cardiovascular;  Laterality: Left;   LOWER EXTREMITY ANGIOGRAPHY Left 07/17/2020   Procedure: LOWER EXTREMITY ANGIOGRAPHY;  Surgeon: Algernon Huxley, MD;  Location: [place] [facility];  Service: Cardiovascular;  Laterality: Left;   PERIPHERAL ARTERIAL STENT GRAFT     x2 left/right   PERIPHERAL VASCULAR BALLOON ANGIOPLASTY Right 01/06/2018   Procedure: PERIPHERAL VASCULAR BALLOON ANGIOPLASTY;  Surgeon: Katha Cabal, MD;  Location: [place] [facility];  Service: Cardiovascular;  Laterality: Right;   PERIPHERAL VASCULAR CATHETERIZATION N/A 07/06/2015   Procedure: Abdominal Aortogram w/Lower Extremity;  Surgeon: Algernon Huxley, MD;  Location: [place] [facility];  Service: Cardiovascular;  Laterality: N/A;   PERIPHERAL VASCULAR CATHETERIZATION  07/06/2015   Procedure: Lower  Extremity Intervention;  Surgeon: Algernon Huxley, MD;  Location: [place] [facility];  Service: Cardiovascular;;    FHx:  Family History  Problem Relation Age of Onset   Heart attack Father    Heart disease Father    Hypertension Father    Hyperlipidemia Father    Hypertension Mother    Hyperlipidemia Mother     Social History:  reports that he has been smoking cigarettes. He has a 41.00 pack-year smoking history. He has never used smokeless tobacco. He reports that he does not drink alcohol and does not use drugs.  Allergies:  Allergies  Allergen Reactions   Dulaglutide Anaphylaxis, Diarrhea and Hives   Ace Inhibitors     hypotension   Other Itching    Skin itching associated with nitro patch   Prednisone Rash    Medications Prior to Admission  Medication Sig Dispense Refill  albuterol (VENTOLIN HFA) 108 (90 Base) MCG/ACT inhaler Inhale into the lungs every 6 (six) hours as needed for wheezing or shortness of breath.     apixaban (ELIQUIS) 5 MG TABS tablet Take 5 mg by mouth 2 (two) times daily.     atorvastatin (LIPITOR) 80 MG tablet TAKE 1 TABLET BY MOUTH ONCE DAILY 90 tablet 0   carvedilol (COREG) 3.125 MG tablet TAKE 1 TABLET BY MOUTH TWICE DAILY WITH MEALS 180 tablet 0   clopidogrel (PLAVIX) 75 MG tablet TAKE 1 TABLET BY MOUTH ONCE DAILY 90 tablet 0   digoxin (LANOXIN) 0.125 MG tablet TAKE 1 TABLET BY MOUTH ONCE DAILY 90 tablet 0   DULoxetine (CYMBALTA) 60 MG capsule Take 60 mg by mouth daily.     ezetimibe (ZETIA) 10 MG tablet Take 1 tablet (10 mg total) by mouth daily. 90 tablet 3   gabapentin (NEURONTIN) 800 MG tablet Take 800 mg by mouth 4 (four) times daily.      ipratropium-albuterol (DUONEB) 0.5-2.5 (3) MG/3ML SOLN Inhale 3 mLs into the lungs every 6 (six) hours as needed (wheezing/sob).      losartan (COZAAR) 25 MG tablet Take 0.5 tablets (12.5 mg total) by mouth daily. 46 tablet 3   metFORMIN (GLUCOPHAGE-XR) 500 MG 24 hr tablet Take 1,000 mg by mouth 2 (two)  times daily.     Multiple Vitamin (MULTI-VITAMINS) TABS Take 1 tablet by mouth daily.      omeprazole (PRILOSEC) 40 MG capsule Take 40 mg by mouth daily.     Oxycodone HCl 10 MG TABS Take 10 mg by mouth every 4 (four) hours as needed (pain).     polyethylene glycol (MIRALAX / GLYCOLAX) 17 g packet Take 17 g by mouth daily as needed for mild constipation. 14 each 0   potassium chloride (KLOR-CON) 10 MEQ tablet TAKE 1 TABLET BY MOUTH TWICE DAILY 180 tablet 0   promethazine (PHENERGAN) 25 MG tablet Take 25 mg by mouth every 8 (eight) hours as needed.     spironolactone (ALDACTONE) 25 MG tablet Take 0.5 tablets (12.5 mg total) by mouth daily. 90 tablet 3   tamsulosin (FLOMAX) 0.4 MG CAPS capsule Take 1 capsule (0.4 mg total) by mouth daily after supper. 30 capsule 1   traZODone (DESYREL) 150 MG tablet Take 150 mg by mouth at bedtime.     VERQUVO 10 MG TABS TAKE 1 TABLET BY MOUTH ONCE DAILY 30 tablet 5   Fluticasone-Umeclidin-Vilant 100-62.5-25 MCG/INH AEPB Inhale 1 puff into the lungs daily.     nitroGLYCERIN (NITROSTAT) 0.4 MG SL tablet Place 1 tablet (0.4 mg total) under the tongue every 5 (five) minutes as needed for chest pain. 30 tablet 5   torsemide 40 MG TABS Take 40 mg by mouth 2 (two) times daily. Take 40 mg by mouth twice daily. 60 tablet 0    Physical Exam: General: Alert and oriented.  No apparent distress.  Vascular: Left DP/PT pulses nonpalpable.  Capillary fill time appears to be slightly delayed to the left foot/digits.  No hair growth noted to digits.  Dependent rubor present to the left lower extremity improved with elevation.  Neuro: Light touch sensation absent to left lower extremity.  Derm: Left fifth metatarsal phalange joint ulceration plantar laterally that measures approximately 3 cm x 2.5 cm x 0.2 cm, wound appears to be contaminated with hair and appears to have an eschar present on the plantar portion, wound bed itself appears to be fibrogranular with scattered areas  of necrosis,  no corresponding erythema or edema specific to this area, no bone exposed.    Posterior left heel dermal type ulceration present that measures approximately 6 cm x 4 cm by dermis, slough type skin debris present over the area with hyperkeratotic buildup, no increased erythema or edema specific to this area, minimal serous drainage.  Wound bed appears to be fibrogranular hyperkeratotic buildup around the periphery.    Left anterior shin with 3 scattered blister type formations that have been deroofed already that have a dry crust present.  These wounds appear to be stable at this time with minimal serous drainage, wound beds appear to be fibrogranular with hyperkeratotic buildup around the periphery.  No obvious signs of infection present.  MSK: Right below-knee amputation, left second toe amputation  Results for orders placed or performed during the hospital encounter of 07/15/21 (from the past 48 hour(s))  CBC     Status: Abnormal   Collection Time: 07/15/21  4:40 PM  Result Value Ref Range   WBC 5.6 4.0 - 10.5 K/uL   RBC 3.56 (L) 4.22 - 5.81 MIL/uL   Hemoglobin 9.0 (L) 13.0 - 17.0 g/dL   HCT 28.6 (L) 39.0 - 52.0 %   MCV 80.3 80.0 - 100.0 fL   MCH 25.3 (L) 26.0 - 34.0 pg   MCHC 31.5 30.0 - 36.0 g/dL   RDW 19.9 (H) 11.5 - 15.5 %   Platelets 169 150 - 400 K/uL   nRBC 0.0 0.0 - 0.2 %    Comment: Performed at [facility], [place], [place] [address]  Troponin I (High Sensitivity)     Status: Abnormal   Collection Time: 07/15/21  4:40 PM  Result Value Ref Range   Troponin I (High Sensitivity) 71 (H) <18 ng/L    Comment: (NOTE) Elevated high sensitivity troponin I (hsTnI) values and significant  changes across serial measurements may suggest ACS but many other  chronic and acute conditions are known to elevate hsTnI results.  Refer to the "Links" section for chest pain algorithms and additional  guidance. Performed at [facility], [address] [place], [place] [address]   Brain natriuretic peptide     Status: Abnormal   Collection Time: 07/15/21  4:40 PM  Result Value Ref Range   B Natriuretic Peptide 1,987.6 (H) 0.0 - 100.0 pg/mL    Comment: Performed at [facility], [address] [place], [place] [address]  Resp Panel by RT-PCR (Flu A&B, Covid) Nasopharyngeal Swab     Status: None   Collection Time: 07/15/21  4:40 PM   Specimen: Nasopharyngeal Swab; Nasopharyngeal(NP) swabs in vial transport medium  Result Value Ref Range   SARS Coronavirus 2 by RT PCR NEGATIVE NEGATIVE    Comment: (NOTE) SARS-CoV-2 target nucleic acids are NOT DETECTED.  The SARS-CoV-2 RNA is generally detectable in upper respiratory specimens during the acute phase of infection. The lowest concentration of SARS-CoV-2 viral copies this assay can detect is 138 copies/mL. A negative result does not preclude SARS-Cov-2 infection and should not be used as the sole basis for treatment or other patient management decisions. A negative result may occur with  improper specimen collection/handling, submission of specimen other than nasopharyngeal swab, presence of viral mutation(s) within the areas targeted by this assay, and inadequate number of viral copies(<138 copies/mL). A negative result must be combined with clinical observations, patient history, and epidemiological information. The expected result is Negative.  Fact Sheet for Patients:  EntrepreneurPulse.com.au  Fact  Sheet for Healthcare Providers:  IncredibleEmployment.be  This test is no t yet approved or cleared by the [place] FDA and  has been authorized for detection and/or diagnosis of SARS-CoV-2 by FDA under an Emergency Use Authorization (EUA). This EUA will remain  in effect (meaning this test can be used) for the duration of the COVID-19 declaration under Section 564(b)(1) of the Act, 21 U.S.C.section 360bbb-3(b)(1),  unless the authorization is terminated  or revoked sooner.       Influenza A by PCR NEGATIVE NEGATIVE   Influenza B by PCR NEGATIVE NEGATIVE    Comment: (NOTE) The Xpert Xpress SARS-CoV-2/FLU/RSV plus assay is intended as an aid in the diagnosis of influenza from Nasopharyngeal swab specimens and should not be used as a sole basis for treatment. Nasal washings and aspirates are unacceptable for Xpert Xpress SARS-CoV-2/FLU/RSV testing.  Fact Sheet for Patients: EntrepreneurPulse.com.au  Fact Sheet for Healthcare Providers: IncredibleEmployment.be  This test is not yet approved or cleared by the [place] FDA and has been authorized for detection and/or diagnosis of SARS-CoV-2 by FDA under an Emergency Use Authorization (EUA). This EUA will remain in effect (meaning this test can be used) for the duration of the COVID-19 declaration under Section 564(b)(1) of the Act, 21 U.S.C. section 360bbb-3(b)(1), unless the authorization is terminated or revoked.  Performed at [facility], [address] [place], [place] [address]   Procalcitonin - Baseline     Status: None   Collection Time: 07/15/21  4:40 PM  Result Value Ref Range   Procalcitonin <0.10 ng/mL    Comment:        Interpretation: PCT (Procalcitonin) <= 0.5 ng/mL: Systemic infection (sepsis) is not likely. Local bacterial infection is possible. (NOTE)       Sepsis PCT Algorithm           Lower Respiratory Tract                                      Infection PCT Algorithm    ----------------------------     ----------------------------         PCT < 0.25 ng/mL                PCT < 0.10 ng/mL          Strongly encourage             Strongly discourage   discontinuation of antibiotics    initiation of antibiotics    ----------------------------     -----------------------------       PCT 0.25 - 0.50 ng/mL            PCT 0.10 - 0.25 ng/mL               OR       >80%  decrease in PCT            Discourage initiation of                                            antibiotics      Encourage discontinuation           of antibiotics    ----------------------------     -----------------------------         PCT >= 0.50 ng/mL  PCT 0.26 - 0.50 ng/mL               AND        <80% decrease in PCT             Encourage initiation of                                             antibiotics       Encourage continuation           of antibiotics    ----------------------------     -----------------------------        PCT >= 0.50 ng/mL                  PCT > 0.50 ng/mL               AND         increase in PCT                  Strongly encourage                                      initiation of antibiotics    Strongly encourage escalation           of antibiotics                                     -----------------------------                                           PCT <= 0.25 ng/mL                                                 OR                                        > 80% decrease in PCT                                      Discontinue / Do not initiate                                             antibiotics  Performed at [facility], [address] [place], [place] [address]   Comprehensive metabolic panel     Status: Abnormal   Collection Time: 07/15/21  4:40 PM  Result Value Ref Range   Sodium 123 (L) 135 - 145 mmol/L   Potassium 5.1 3.5 - 5.1 mmol/L    Comment: HEMOLYSIS AT THIS LEVEL MAY AFFECT RESULT   Chloride 90 (L) 98 - 111 mmol/L   CO2 26 22 - 32 mmol/L  Glucose, Bld 98 70 - 99 mg/dL    Comment: Glucose reference range applies only to samples taken after fasting for at least 8 hours.   BUN 39 (H) 6 - 20 mg/dL   Creatinine, Ser 1.55 (H) 0.61 - 1.24 mg/dL   Calcium 8.1 (L) 8.9 - 10.3 mg/dL   Total Protein 6.5 6.5 - 8.1 g/dL   Albumin 3.2 (L) 3.5 - 5.0 g/dL   AST 32 15 - 41 U/L   ALT 14 0 - 44 U/L   Alkaline  Phosphatase 108 38 - 126 U/L   Total Bilirubin 2.9 (H) 0.3 - 1.2 mg/dL   GFR, Estimated 52 (L) >60 mL/min    Comment: (NOTE) Calculated using the CKD-EPI Creatinine Equation (2021)    Anion gap 7 5 - 15    Comment: Performed at [facility], [address] [place], [place] [address]  Protime-INR     Status: Abnormal   Collection Time: 07/15/21  4:40 PM  Result Value Ref Range   Prothrombin Time 16.7 (H) 11.4 - 15.2 seconds   INR 1.4 (H) 0.8 - 1.2    Comment: (NOTE) INR goal varies based on device and disease states. Performed at [facility], [address] [place], [place] [address]   APTT     Status: Abnormal   Collection Time: 07/15/21  4:40 PM  Result Value Ref Range   aPTT 38 (H) 24 - 36 seconds    Comment:        IF BASELINE aPTT IS ELEVATED, SUGGEST PATIENT RISK ASSESSMENT BE USED TO DETERMINE APPROPRIATE ANTICOAGULANT THERAPY. Performed at [facility], [place], [place] [address]   Troponin I (High Sensitivity)     Status: Abnormal   Collection Time: 07/15/21  6:14 PM  Result Value Ref Range   Troponin I (High Sensitivity) 78 (H) <18 ng/L    Comment: (NOTE) Elevated high sensitivity troponin I (hsTnI) values and significant  changes across serial measurements may suggest ACS but many other  chronic and acute conditions are known to elevate hsTnI results.  Refer to the "Links" section for chest pain algorithms and additional  guidance. Performed at [facility], [address] [place], [place] [address]   Procalcitonin     Status: None   Collection Time: 07/16/21  4:32 AM  Result Value Ref Range   Procalcitonin 0.11 ng/mL    Comment:        Interpretation: PCT (Procalcitonin) <= 0.5 ng/mL: Systemic infection (sepsis) is not likely. Local bacterial infection is possible. (NOTE)       Sepsis PCT Algorithm           Lower Respiratory Tract                                      Infection PCT Algorithm     ----------------------------     ----------------------------         PCT < 0.25 ng/mL                PCT < 0.10 ng/mL          Strongly encourage             Strongly discourage   discontinuation of antibiotics    initiation of antibiotics    ----------------------------     -----------------------------       PCT 0.25 - 0.50 ng/mL  PCT 0.10 - 0.25 ng/mL               OR       >80% decrease in PCT            Discourage initiation of                                            antibiotics      Encourage discontinuation           of antibiotics    ----------------------------     -----------------------------         PCT >= 0.50 ng/mL              PCT 0.26 - 0.50 ng/mL               AND        <80% decrease in PCT             Encourage initiation of                                             antibiotics       Encourage continuation           of antibiotics    ----------------------------     -----------------------------        PCT >= 0.50 ng/mL                  PCT > 0.50 ng/mL               AND         increase in PCT                  Strongly encourage                                      initiation of antibiotics    Strongly encourage escalation           of antibiotics                                     -----------------------------                                           PCT <= 0.25 ng/mL                                                 OR                                        > 80% decrease in PCT  Discontinue / Do not initiate                                             antibiotics  Performed at [facility], [address] [place], [place] [address]   Comprehensive metabolic panel     Status: Abnormal   Collection Time: 07/16/21  4:32 AM  Result Value Ref Range   Sodium 126 (L) 135 - 145 mmol/L   Potassium 5.2 (H) 3.5 - 5.1 mmol/L   Chloride 92 (L) 98 - 111 mmol/L   CO2 26 22 - 32 mmol/L   Glucose, Bld  127 (H) 70 - 99 mg/dL    Comment: Glucose reference range applies only to samples taken after fasting for at least 8 hours.   BUN 45 (H) 6 - 20 mg/dL   Creatinine, Ser 1.71 (H) 0.61 - 1.24 mg/dL   Calcium 8.6 (L) 8.9 - 10.3 mg/dL   Total Protein 6.1 (L) 6.5 - 8.1 g/dL   Albumin 3.3 (L) 3.5 - 5.0 g/dL   AST 25 15 - 41 U/L   ALT 14 0 - 44 U/L   Alkaline Phosphatase 108 38 - 126 U/L   Total Bilirubin 2.3 (H) 0.3 - 1.2 mg/dL   GFR, Estimated 46 (L) >60 mL/min    Comment: (NOTE) Calculated using the CKD-EPI Creatinine Equation (2021)    Anion gap 8 5 - 15    Comment: Performed at [facility], [address] [place], [place] [address]  CBC     Status: Abnormal   Collection Time: 07/16/21  4:32 AM  Result Value Ref Range   WBC 3.9 (L) 4.0 - 10.5 K/uL   RBC 3.63 (L) 4.22 - 5.81 MIL/uL   Hemoglobin 9.1 (L) 13.0 - 17.0 g/dL   HCT 28.2 (L) 39.0 - 52.0 %   MCV 77.7 (L) 80.0 - 100.0 fL   MCH 25.1 (L) 26.0 - 34.0 pg   MCHC 32.3 30.0 - 36.0 g/dL   RDW 20.2 (H) 11.5 - 15.5 %   Platelets 149 (L) 150 - 400 K/uL   nRBC 0.0 0.0 - 0.2 %    Comment: Performed at [facility], [address] [place], [place] [address]  Magnesium     Status: None   Collection Time: 07/16/21  4:32 AM  Result Value Ref Range   Magnesium 2.3 1.7 - 2.4 mg/dL    Comment: Performed at [facility], [address] [place], [place] [address]  TSH     Status: None   Collection Time: 07/16/21  4:32 AM  Result Value Ref Range   TSH 1.909 0.350 - 4.500 uIU/mL    Comment: Performed by a 3rd Generation assay with a functional sensitivity of <=0.01 uIU/mL. Performed at [facility], [address] [place], [place] [address]    DG Chest Portable 1 View  Result Date: 07/15/2021 CLINICAL DATA:  Shortness of breath, volume overload or COPD. EXAM: PORTABLE CHEST 1 VIEW COMPARISON:  Chest radiograph dated in June 14, 2021. FINDINGS: Heart is enlarged. Atherosclerotic calcification of  the aortic arch. Pulmonary vascular congestion without evidence of pulmonary edema or pleural effusion. No focal consolidation. IMPRESSION: 1. Stable cardiomegaly with atherosclerotic calcification of the aortic arch. 2. Mild pulmonary vascular congestion. No evidence of pulmonary edema or infiltrate. Electronically Signed   By: Judye Bos.O.  On: 07/15/2021 16:38    Blood pressure (!) 159/118, pulse 69, temperature 98.3 F (36.8 C), temperature source Oral, resp. rate 20, height 5\' 7"  (1.702 m), weight 94.9 kg, SpO2 92 %.  Assessment Subcutaneous ulceration left fifth metatarsal phalangeal joint Dermal ulcerations to the left posterior heel and left anterior lower leg x3 Peripheral vascular disease Diabetes type 2 polyneuropathy  Plan -Patient seen and examined. -Wound debridements performed as described low without incident.  Patient tolerated well. -Applied Betadine wet-to-dry dressing with plenty of padding.  Ordered Prevalon boot for usage.  Orders placed for dressing changes daily. -Referral placed to vascular surgery service for further assessment of arterial flow. -Patient overall is at high risk for limb loss due to multiple wounds as well as history of peripheral vascular disease and diabetes with polyneuropathy.  Patient already has a history of right BKA.  Patient is at high risk. -Surgical shoe ordered for left foot.  Debridment of ulcer: Location: Left fifth metatarsal phalangeal joint Pre-debridement measurement: 3 cm x 2.5 cm x 0.2 cm Post-debridement measurement: Same Tissue removed:   Fibrous tissue, hyperkeratotic tissue, biofilm Ulcer was debrided sharply 100% excisional with combination of tissue nippers and scalpel blade into the subcutaneous tissue.  Wound bed appeared to be 70% granular and 30% fibrotic after debridement, scant areas of necrosis.  Debridment of ulcer: Location: Left posterior heel Pre-debridement measurement: 6 cm x 4 cm by  dermis Post-debridement measurement: Same Tissue removed:   Fibrous tissue, hyperkeratotic tissue, biofilm Ulcer was debrided sharply 100% excisional with combination of tissue nippers and scalpel blade into the dermal tissue.  Wound bed appeared to be 80% granular, 10% fibrotic after debridement, scant areas of necrosis.  Podiatry team to follow more peripherally at this time.  Patient can follow-up as outpatient in clinic.   Caroline More, DPM 07/16/2021, 2:59 PM

## 2021-07-16 NOTE — Consult Note (Signed)
° °  Heart Failure Nurse Navigator Note  HFrEF <20%.  Early dilated left ventricle.  Ventricular systolic function is severely reduced.  Right ventricular size is moderately enlarged.  Newly elevated pulmonary artery pressures.  He presented to the emergency room with complaints of shortness of breath and leg swelling.  He had been seen at the outpatient heart failure clinic 3 days ago and referred to the emergency room for evaluation but due to his home not having electricity and he is well pump was out, he chose to go home.  Comorbidities:  Coronary artery disease Permanent atrial fibrillation COPD Hypertension Type 2 diabetes Obstructive sleep apnea Continued tobacco abuse  Medications:  Eliquis 5 mg 2 times a day Atorvastatin 80 mg daily Plavix 75 mg daily Digoxin 125 mcg daily Zetia 10 mg daily Vericiguat 1 tablet daily   Labs:  Sodium 126, potassium 5.2, chloride 92, CO2 26, BUN 45, creatinine 1.71 up from 1.55 yesterday GFR 46, magnesium 2.3, TSH 1.9, procalcitonin 0.1  Weight is 94.9 kg Blood pressure 159/118 Intake not documented Output 500 mL  Initial meeting with patient on this admission.  Discussed how he takes care of himself, he continues to smoke.  With discussing his fluid intake he states that he drinks 10 cans of [employer] daily.  Explained the reasoning behind the fluid restriction and being on a water pill.  He voices understanding.  He states that he has been compliant with his medications.  He lives with his girlfriend of 20+ years.  She was not present at the time of the visit.  He has follow-up appointment in the outpatient heart failure clinic on February 23 at 2:30 in the afternoon.  Pricilla Riffle RN [facility]

## 2021-07-16 NOTE — Consult Note (Addendum)
[facility] Nurse Consult Note: Reason for Consult: Full thickness lesions on left foot. 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 Vickki Muff, Podiatric Medicine). Wounds mimic those from arterial insufficiency, but patient's last ABI on 05/09/21 was 1.0. Wound type: Etiology not known. Pressure Injury POA: N/A Wound bed:dry, dark Drainage (amount, consistency, odor) None Periwound: unremarkable.  LE edema Dressing procedure/placement/frequency: I will provide Nursing with conservative care guidance for the care of the LLE to include washing the foot with soap and water, rinsing and drying thoroughly.  The ulcers are to be painted with povidone iodine swabsticks and allowed to air-dry.  When dry, dry gauze dressings are to be applied and secured with a few turns of Kerlix roll gauze/paper tape.  I have communicated with the patient's Bedside RN, B. Graves. I will also provide my recommendation to Dr. Tonie Griffith via Secure Chat that either Podiatry or Vascular be consulted during this admission for their evaluation of the left foot and input to the POC.  [facility] nursing team will not follow, but will remain available to this patient, the nursing and medical teams.  Please re-consult if needed. Thanks, Maudie Flakes, MSN, RN, [place], Arther Abbott  Pager# [phone]      Addendum:  I have communicated my recommendations to Dr Billie Ruddy who is currently, the attending physician.

## 2021-07-16 NOTE — Consult Note (Signed)
Cardiology Consultation:   Patient ID: [name] MRN: [id number]; DOB: 03/22/1961  Admit date: 07/15/2021 Date of Consult: 07/16/2021  PCP:  [name] Jacobs, MD   [facility] Providers Cardiologist:  Ida Rogue, MD   {   Patient Profile:   [name] [name] is a 61 y.o. male with a hx of with a hx of CAD s/p prior PCI, chronic combined systolic and diastolic congestive heart failure, ICM, pulmonary hypertension, tobacco abuse, COPD, HTN, HLD, permanent atrial fibrillation, obesity, obstructive sleep apnea, type 2 diabetes mellitus, stage III chronic kidney disease, peripheral arterial disease status post right BKA, and carotid arterial disease who is being seen 07/16/2021 for the evaluation of acute CHF at the request of Dr Billie Ruddy.  History of Present Illness:   [name] [name] previously underwent PCI of the LAD in 2013 with PCI of the RCA in October 2014.  Catheterization in November 2018 revealed severe multivessel CAD.  He was seen by CT surgery and not felt to be a suitable candidate.  He has a long history of LV dysfunction with EF of less than 20% by echo January this year.  He has prolonged hospitalization at that time for significant volume overload and low output heart failure.  He was admitted twice in October 2022, initially with lower extremity cellulitis and COPD exacerbation.  He was admitted second time in October 2022 with shock possibly cardiogenic versus septic in etiology and underwent significant diuresis, though left AMA.  Echo at that time showed EF 2025% with severely reduced RV SF and severely elevated PASP.  Patient was admitted early January 2023 and seen by cardiology during admission.  Was found to be in cardiogenic shock needing vasopressor support.  He was diuresed with IV Lasix and sent home on torsemide 40 mg twice daily.  Patient had been following with the heart failure clinic since discharge, receiving IV Lasix as needed.  Patient with a history of noncompliance,  unsure of his medications.  On 07/12/2021 patient was recommended to go to the ED, however he declined.  The patient presented to the ER [facility] 07/15/2021 for leg swelling and shortness of breath.  Said to have been worsening over the last couple weeks.  Reports he had been taking his torsemide every day.  He reports sharp pain all over, including chest pain.  In the ER blood pressure 102/54, pulse 59, respiratory rate 18, afebrile, 95% O2.  Blood pressure low as 94/43.  Labs showed sodium 123, potassium 5.1, chloride 90, creatinine 1.55, BUN 39, calcium 8.1, albumin 3.2.  BNP 1987.6. HS trop 71>78. WBC 5.6, Hgb 9.0. Respiratory panel negative.CXR showed mild pulmonary congestion. The patient was given IV lasix and 5mg  midodrine and admitted.   Past Medical History:  Diagnosis Date   Acute on chronic heart failure ([place])    AKI (acute kidney injury) ([place]) 01/03/2018   Angina at rest [facility]) 08/13/2017   Arthritis    Asthma    Atherosclerosis    BPH (benign prostatic hyperplasia)    Carotid arterial disease ([place])    Cellulitis of foot, left 09/27/2019   Charcot's joint of foot, right    Chronic combined systolic (congestive) and diastolic (congestive) heart failure ([place])    a. 02/2013 EF 50% by LV gram; b. 04/2017 Echo: EF 25-30%. diff HK. Gr2 DD; c. 06/2020 Echo: EF <20%, glob HK. RVS 53.74mmHg. Sev BAE. Mild to mod TR.   Chronic foot pain (Secondary Area of Pain) (Right) 01/29/2018   COPD (chronic  obstructive pulmonary disease) (HCC)    COPD with acute exacerbation ([place]) 01/30/2020   Coronary artery disease    a. 2013 S/P PCI of LAD [facility]);  b. 03/2013 PCI: RCA 90p ( 2.5 x 23 mm DES); c. 02/2014 Cath: patent RCA stent->Med Rx; d. 04/2017 Cath: LM nl, LAd 30ost/p, 54m/d, D1 80ost, LCX 90p/m, OM1 85, RCA 70/20p, 29m, 70d->Referred for CT Surg-felt to be poor candidate; d. 01/2020 Cath: LM nl, LAD 20p/m, 60d. D1 80. LCX 90p/m. LPAV 95, RCA 80/20p, 80p/m.   Diabetic neuropathy ([place])    Diabetic ulcer of  right foot ([place])    Hernia    HFrEF (heart failure with reduced ejection fraction) ([place]) 01/30/2020   Hyperlipidemia    Hypertension    Ischemic cardiomyopathy    a. 02/2013 EF 50% by LV gram; b. 04/2017 Echo: EF 25-30%. diff HK. Gr2 DD; c. 06/2020 Echo: EF <20%, glob HK. RVS 53.84mmHg. Sev BAE. Mild to mod TR.   Morbid obesity ([place])    Neuropathy    PAD (peripheral artery disease) ([place])    a. Followed by Dr. Lucky Cowboy; b. 08/2010 Periph Angio: RSFA 70-80p (6X60 self-expanding stent); c. 09/2010 Periph Angio: L SFA 100p (7x unknown length self-expanding stent); d. 06/2015 Periph Angio: R SFA short segment occlusion (6x12 self-expanding stent).   Persistent atrial fibrillation (HCC)    Restless leg syndrome    Sepsis ([place]) 03/30/2018   Sleep apnea    Subclavian artery stenosis, right (HCC)    Syncope and collapse    Tobacco use    a. 75+ yr hx - still smoking 1ppd, down from 2 ppd.   Type II diabetes mellitus (HCC)    Unstable angina ([place]) 07/11/2016   Varicose vein     Past Surgical History:  Procedure Laterality Date   ABDOMINAL AORTAGRAM N/A 06/23/2013   Procedure: ABDOMINAL Maxcine Ham;  Surgeon: Wellington Hampshire, MD;  Location: [place] CATH LAB;  Service: Cardiovascular;  Laterality: N/A;   AMPUTATION Right 04/01/2018   Procedure: AMPUTATION BELOW KNEE;  Surgeon: Algernon Huxley, MD;  Location: [facility] ORS;  Service: Vascular;  Laterality: Right;   AMPUTATION TOE Left 08/11/2020   Procedure: AMPUTATION TOE-2nd Toe;  Surgeon: Samara Deist, DPM;  Location: [facility] ORS;  Service: Podiatry;  Laterality: Left;   APPENDECTOMY     CARDIAC CATHETERIZATION  10/14   [place]; X1 STENT PROXIMAL RCA   CARDIAC CATHETERIZATION  02/2010   [facility]   CARDIAC CATHETERIZATION  02/21/2014   [facility]   CLAVICLE SURGERY     HERNIA REPAIR     IRRIGATION AND DEBRIDEMENT FOOT Right 01/04/2018   Procedure: IRRIGATION AND DEBRIDEMENT FOOT;  Surgeon: Samara Deist, DPM;  Location: [facility] ORS;  Service: Podiatry;  Laterality: Right;   LEFT  HEART CATH AND CORONARY ANGIOGRAPHY N/A 01/20/2020   Procedure: LEFT HEART CATH AND CORONARY ANGIOGRAPHY;  Surgeon: Wellington Hampshire, MD;  Location: [place] CV LAB;  Service: Cardiovascular;  Laterality: N/A;   LEFT HEART CATH AND CORS/GRAFTS ANGIOGRAPHY N/A 04/28/2017   Procedure: LEFT HEART CATH AND CORONARY ANGIOGRAPHY;  Surgeon: Wellington Hampshire, MD;  Location: [place] CV LAB;  Service: Cardiovascular;  Laterality: N/A;   LOWER EXTREMITY ANGIOGRAPHY Right 03/03/2018   Procedure: LOWER EXTREMITY ANGIOGRAPHY;  Surgeon: Katha Cabal, MD;  Location: [place] CV LAB;  Service: Cardiovascular;  Laterality: Right;   LOWER EXTREMITY ANGIOGRAPHY Left 08/24/2018   Procedure: LOWER EXTREMITY ANGIOGRAPHY;  Surgeon: Algernon Huxley, MD;  Location: [place] CV LAB;  Service: Cardiovascular;  Laterality: Left;   LOWER EXTREMITY ANGIOGRAPHY Left 06/14/2019   Procedure: LOWER EXTREMITY ANGIOGRAPHY;  Surgeon: Algernon Huxley, MD;  Location: [place] CV LAB;  Service: Cardiovascular;  Laterality: Left;   LOWER EXTREMITY ANGIOGRAPHY Left 08/02/2019   Procedure: LOWER EXTREMITY ANGIOGRAPHY;  Surgeon: Algernon Huxley, MD;  Location: [place] CV LAB;  Service: Cardiovascular;  Laterality: Left;   LOWER EXTREMITY ANGIOGRAPHY Left 07/17/2020   Procedure: LOWER EXTREMITY ANGIOGRAPHY;  Surgeon: Algernon Huxley, MD;  Location: [place] CV LAB;  Service: Cardiovascular;  Laterality: Left;   PERIPHERAL ARTERIAL STENT GRAFT     x2 left/right   PERIPHERAL VASCULAR BALLOON ANGIOPLASTY Right 01/06/2018   Procedure: PERIPHERAL VASCULAR BALLOON ANGIOPLASTY;  Surgeon: Katha Cabal, MD;  Location: [place] CV LAB;  Service: Cardiovascular;  Laterality: Right;   PERIPHERAL VASCULAR CATHETERIZATION N/A 07/06/2015   Procedure: Abdominal Aortogram w/Lower Extremity;  Surgeon: Algernon Huxley, MD;  Location: [place] CV LAB;  Service: Cardiovascular;  Laterality: N/A;   PERIPHERAL VASCULAR  CATHETERIZATION  07/06/2015   Procedure: Lower Extremity Intervention;  Surgeon: Algernon Huxley, MD;  Location: [place] CV LAB;  Service: Cardiovascular;;     Home Medications:  Prior to Admission medications   Medication Sig Start Date End Date Taking? Authorizing Provider  albuterol (VENTOLIN HFA) 108 (90 Base) MCG/ACT inhaler Inhale into the lungs every 6 (six) hours as needed for wheezing or shortness of breath.   Yes [provider]  apixaban (ELIQUIS) 5 MG TABS tablet Take 5 mg by mouth 2 (two) times daily.   Yes [provider]  atorvastatin (LIPITOR) 80 MG tablet TAKE 1 TABLET BY MOUTH ONCE DAILY 06/12/21  Yes Gollan, Kathlene November, MD  carvedilol (COREG) 3.125 MG tablet TAKE 1 TABLET BY MOUTH TWICE DAILY WITH MEALS 06/12/21  Yes Gollan, Kathlene November, MD  clopidogrel (PLAVIX) 75 MG tablet TAKE 1 TABLET BY MOUTH ONCE DAILY 06/12/21  Yes Gollan, Kathlene November, MD  digoxin (LANOXIN) 0.125 MG tablet TAKE 1 TABLET BY MOUTH ONCE DAILY 06/12/21  Yes Gollan, Kathlene November, MD  DULoxetine (CYMBALTA) 60 MG capsule Take 60 mg by mouth daily.   Yes [provider]  ezetimibe (ZETIA) 10 MG tablet Take 1 tablet (10 mg total) by mouth daily. 10/09/20  Yes Minna Merritts, MD  gabapentin (NEURONTIN) 800 MG tablet Take 800 mg by mouth 4 (four) times daily.  08/14/17 04/04/29 Yes [provider]  ipratropium-albuterol (DUONEB) 0.5-2.5 (3) MG/3ML SOLN Inhale 3 mLs into the lungs every 6 (six) hours as needed (wheezing/sob).    Yes [provider]  losartan (COZAAR) 25 MG tablet Take 0.5 tablets (12.5 mg total) by mouth daily. 10/09/20  Yes Minna Merritts, MD  metFORMIN (GLUCOPHAGE-XR) 500 MG 24 hr tablet Take 1,000 mg by mouth 2 (two) times daily. 01/02/21  Yes [provider]  Multiple Vitamin (MULTI-VITAMINS) TABS Take 1 tablet by mouth daily.    Yes [provider]  omeprazole (PRILOSEC) 40 MG capsule Take 40 mg by mouth daily.   Yes [provider]   Oxycodone HCl 10 MG TABS Take 10 mg by mouth every 4 (four) hours as needed (pain).   Yes [provider]  polyethylene glycol (MIRALAX / GLYCOLAX) 17 g packet Take 17 g by mouth daily as needed for mild constipation. 08/12/20  Yes Wieting, Richard, MD  potassium chloride (KLOR-CON) 10 MEQ tablet TAKE 1 TABLET BY MOUTH TWICE DAILY 06/12/21  Yes Gollan,  Kathlene November, MD  promethazine (PHENERGAN) 25 MG tablet Take 25 mg by mouth every 8 (eight) hours as needed. 04/02/21  Yes [provider]  spironolactone (ALDACTONE) 25 MG tablet Take 0.5 tablets (12.5 mg total) by mouth daily. 10/09/20  Yes Minna Merritts, MD  tamsulosin (FLOMAX) 0.4 MG CAPS capsule Take 1 capsule (0.4 mg total) by mouth daily after supper. 07/01/20  Yes Shawna Clamp, MD  traZODone (DESYREL) 150 MG tablet Take 150 mg by mouth at bedtime. 07/12/19  Yes [provider]  VERQUVO 10 MG TABS TAKE 1 TABLET BY MOUTH ONCE DAILY 11/16/20  Yes Alisa Graff, FNP  Fluticasone-Umeclidin-Vilant 100-62.5-25 MCG/INH AEPB Inhale 1 puff into the lungs daily.    [provider]  nitroGLYCERIN (NITROSTAT) 0.4 MG SL tablet Place 1 tablet (0.4 mg total) under the tongue every 5 (five) minutes as needed for chest pain. 10/09/20   Minna Merritts, MD  torsemide 40 MG TABS Take 40 mg by mouth 2 (two) times daily. Take 40 mg by mouth twice daily. 03/30/21 07/11/21  Sidney Ace, MD    Inpatient Medications: Scheduled Meds:  apixaban  5 mg Oral BID   atorvastatin  80 mg Oral Daily   clopidogrel  75 mg Oral Daily   digoxin  125 mcg Oral Daily   DULoxetine  60 mg Oral Daily   ezetimibe  10 mg Oral Daily   fluticasone furoate-vilanterol  1 puff Inhalation Daily   And   umeclidinium bromide  1 puff Inhalation Daily   gabapentin  800 mg Oral QID   metFORMIN  1,000 mg Oral BID WC   multivitamin with minerals  1 tablet Oral Daily   nicotine  21 mg Transdermal Daily   pantoprazole  40 mg Oral Daily   sodium chloride  flush  3 mL Intravenous Q12H   tamsulosin  0.4 mg Oral QPC supper   Vericiguat  1 tablet Oral Daily   Continuous Infusions:  sodium chloride     PRN Meds: sodium chloride, acetaminophen **OR** acetaminophen, albuterol, ipratropium-albuterol, nitroGLYCERIN, oxyCODONE, prochlorperazine, senna-docusate, sodium chloride flush  Allergies:    Allergies  Allergen Reactions   Dulaglutide Anaphylaxis, Diarrhea and Hives   Ace Inhibitors     hypotension   Other Itching    Skin itching associated with nitro patch   Prednisone Rash    Social History:   Social History   Socioeconomic History   Marital status: Significant Other    Spouse name: Not on file   Number of children: 0   Years of education: 12   Highest education level: 12th grade  Occupational History   Occupation: on disability  Tobacco Use   Smoking status: Every Day    Packs/day: 1.00    Years: 41.00    Pack years: 41.00    Types: Cigarettes   Smokeless tobacco: Never  Vaping Use   Vaping Use: Never used  Substance and Sexual Activity   Alcohol use: No   Drug use: No   Sexual activity: Yes  Other Topics Concern   Not on file  Social History Narrative   Lives at home in [place] with girlfriend. Independent at baseline.   Social Determinants of Health   Financial Resource Strain: Not on file  Food Insecurity: Not on file  Transportation Needs: Not on file  Physical Activity: Not on file  Stress: Not on file  Social Connections: Not on file  Intimate Partner Violence: Not on file  Family History:    Family History  Problem Relation Age of Onset   Heart attack Father    Heart disease Father    Hypertension Father    Hyperlipidemia Father    Hypertension Mother    Hyperlipidemia Mother      ROS:  Please see the history of present illness.   All other ROS reviewed and negative.     Physical Exam/Data:   Vitals:   07/16/21 0700 07/16/21 0808 07/16/21 0911 07/16/21 1145  BP:  104/68  (!)  159/118  Pulse:  80 62 69  Resp:  19  20  Temp: (P) 98.3 F (36.8 C) 98.3 F (36.8 C)  98.3 F (36.8 C)  TempSrc:    Oral  SpO2:  98%  92%  Weight:      Height:        Intake/Output Summary (Last 24 hours) at 07/16/2021 1352 Last data filed at 07/16/2021 1300 Gross per 24 hour  Intake 600 ml  Output 500 ml  Net 100 ml   Last 3 Weights 07/16/2021 07/15/2021 07/15/2021  Weight (lbs) 209 lb 3.5 oz 208 lb 14.4 oz 180 lb  Weight (kg) 94.9 kg 94.756 kg 81.647 kg  Some encounter information is confidential and restricted. Go to Review Flowsheets activity to see all data.     Body mass index is 32.77 kg/m.  General:  Well nourished, well developed, in no acute distress HEENT: normal Neck: no JVD Vascular: No carotid bruits; Distal pulses 2+ bilaterally Cardiac:  normal S1, S2; RRR; no murmur  Lungs:  diminished at base, wheezing Abd: distended, nontender, no hepatomegaly  Ext: 1+ lower leg edema Musculoskeletal:  No deformities, BUE and BLE strength normal and equal Skin: warm and dry  Neuro:  CNs 2-12 intact, no focal abnormalities noted Psych:  Normal affect   EKG:  The EKG was personally reviewed and demonstrates:  Afib, HR 56bpm RBBB, PVCs, no changes Telemetry:  Telemetry was personally reviewed and demonstrates:  Afib, HR 50s, PVCs  Relevant CV Studies:  2D echo 06/15/2021: 1. Left ventricular ejection fraction, by estimation, is <20%. The left  ventricle has severely decreased function. The left ventricle demonstrates  global hypokinesis. The left ventricular internal cavity size was severely  dilated. Left ventricular  diastolic parameters are indeterminate.   2. Right ventricular systolic function is severely reduced. The right  ventricular size is moderately enlarged. There is severely elevated  pulmonary artery systolic pressure. The estimated right ventricular  systolic pressure is 09.7 mmHg.   3. The mitral valve is normal in structure. No evidence of mitral valve   regurgitation. No evidence of mitral stenosis.   4. The aortic valve is normal in structure. Aortic valve regurgitation is  not visualized. Aortic valve sclerosis/calcification is present, without  any evidence of aortic stenosis.   5. The inferior vena cava is dilated in size with <50% respiratory  variability, suggesting right atrial pressure of 15 mmHg. ___________   2D echo 03/26/2021: 1. Left ventricular ejection fraction, by estimation, is 20 to 25%. The  left ventricle has severely decreased function. The left ventricle  demonstrates global hypokinesis. The left ventricular internal cavity size  was severely dilated. Left ventricular  diastolic function could not be evaluated.   2. Right ventricular systolic function is severely reduced. The right  ventricular size is mildly enlarged. There is severely elevated pulmonary  artery systolic pressure.   3. Left atrial size was mildly dilated.   4. Right atrial size  was severely dilated.   5. The mitral valve is abnormal. Moderate mitral valve regurgitation.   6. Tricuspid valve regurgitation is moderate to severe.   7. The aortic valve has an indeterminant number of cusps. There is  moderate calcification of the aortic valve. There is moderate thickening  of the aortic valve. Aortic valve regurgitation is not visualized. There  appears to be mild aortic stenosis, with   LVOT gradient underestimated due to low LVEF.   8. The inferior vena cava is normal in size with <50% respiratory  variability, suggesting right atrial pressure of 8 mmHg. __________   2D echo 06/17/2020:  1. Left ventricular ejection fraction, by estimation, is <20%. Left  ventricular ejection fraction by PLAX is 14 %. The left ventricle has  severely decreased function. The left ventricle demonstrates global  hypokinesis. The left ventricular internal  cavity size was severely dilated. Left ventricular diastolic parameters  are indeterminate.   2. Right  ventricular systolic function is severely reduced. The right  ventricular size is moderately enlarged. There is severely elevated  pulmonary artery systolic pressure. The estimated right ventricular  systolic pressure is 32.3 mmHg.   3. Left atrial size was severely dilated.   4. Right atrial size was severely dilated.   5. Tricuspid valve regurgitation is mild to moderate.   6. The inferior vena cava is dilated in size with <50% respiratory  variability, suggesting right atrial pressure of 15 mmHg. __________   LHC 01/20/2020: Prox RCA-1 lesion is 80% stenosed. Prox RCA-2 lesion is 20% stenosed. Prox RCA to Mid RCA lesion is 80% stenosed. Prox LAD lesion is 20% stenosed. 1st Diag lesion is 80% stenosed. Mid LAD lesion is 20% stenosed. Dist LAD lesion is 60% stenosed. Prox Cx to Mid Cx lesion is 90% stenosed. LPAV lesion is 95% stenosed.   1. Significant underlying three-vessel coronary artery disease. Patent proximal LAD stent with mild in-stent restenosis. Moderate diffuse disease in the distal LAD. Significant stenosis in the proximal left circumflex at the origin of the posterior AV groove artery which is also heavily diseased at the ostium. This is a bifurcation lesion and heavily calcified. RCA stent is patent. However, there is significant proximal disease in the whole mid to distal segment is diffusely diseased and small caliber. 2. Left ventricular angiography was not performed. EF was severe reduced by echo. 3. Moderately elevated left ventricular end-diastolic pressure at 28 mmHg   Recommendations: The patient's cardiomyopathy seems to be out of proportion to his coronary artery disease as the LAD itself does not seem to have obstructive disease at the present time. The RCA is diffusely diseased and too small to stent. The only target for revascularization would be the left circumflex but it is a calcified bifurcation lesion and likely requires atherectomy. In order to do this  safely, the patient has to be optimized from a heart failure standpoint as he appears to be volume overloaded. Recommend intravenous diuresis. If limited by hypotension, we might need to consider inotropic support and small dose norepinephrine drip initially. I added small dose digoxin. Treat underlying atrial fibrillation. Resume heparin drip 8 hours after sheath pull. __________   2D echo 01/20/2020: 1. Left ventricular ejection fraction, by estimation, is <20%. The left  ventricle has severely decreased function. The left ventricle demonstrates  global hypokinesis. The left ventricular internal cavity size was  moderately dilated. Left ventricular  diastolic parameters are indeterminate.   2. Right ventricular systolic function is moderately reduced. The right  ventricular size is moderately enlarged. There is normal pulmonary artery  systolic pressure. The estimated right ventricular systolic pressure is  54.6 mmHg.   3. Left atrial size was mildly dilated.   4. Mild mitral valve regurgitation.   5. Tricuspid valve regurgitation is moderate. __________   2D echo 09/28/2019: 1. Left ventricular ejection fraction, by estimation, is 20 to 25%. The  left ventricle has severely decreased function. The left ventricle  demonstrates global hypokinesis. The left ventricular internal cavity size  was severely dilated. There is mild  left ventricular hypertrophy. Indeterminate diastolic filling due to E-A  fusion.   2. Right ventricular systolic function is moderately reduced. The right  ventricular size is mildly enlarged. There is moderately elevated  pulmonary artery systolic pressure. The estimated right ventricular  systolic pressure is 50.3 mmHg.   3. Left atrial size was mildly dilated.   4. Right atrial size was moderately dilated.   5. The mitral valve is degenerative. Mild mitral valve regurgitation. No  evidence of mitral stenosis.   6. Tricuspid valve regurgitation is mild to  moderate.   7. The aortic valve was not well visualized. Aortic valve regurgitation  is not visualized. Mild aortic valve sclerosis is present, with no  evidence of aortic valve stenosis.   8. The inferior vena cava is dilated in size with <50% respiratory  variability, suggesting right atrial pressure of 15 mmHg. ___________   2D echo 11/06/2018: 1. The left ventricle has severely reduced systolic function, with an  ejection fraction of 20-25%. The cavity size was moderately dilated.  Indeterminate diastolic filling due to E-A fusion. Left ventricular  diffuse hypokinesis.   2. The right ventricle has severely reduced systolic function. The cavity  was mildly enlarged. There is no increase in right ventricular wall  thickness. Right ventricular systolic pressure is moderately elevated with  an estimated pressure of 59.6 mmHg.   3. Left atrial size was mildly dilated.   4. Right atrial size was mildly dilated.   5. The mitral valve is degenerative. Mild thickening of the mitral valve  leaflet. Mild calcification of the mitral valve leaflet. There is mild  mitral annular calcification present.   6. The tricuspid valve is degenerative. Tricuspid valve regurgitation is  moderate.   7. The aortic valve was not well visualized. Moderate thickening of the  aortic valve. Moderate calcification of the aortic valve.   8. The inferior vena cava was dilated in size with <50% respiratory  variability. __________   LHC 04/2017: Prox RCA-1 lesion is 70% stenosed. Prox RCA-2 lesion is 20% stenosed. Mid RCA lesion is 80% stenosed. Mid RCA to Dist RCA lesion is 70% stenosed. Ost LAD to Prox LAD lesion is 30% stenosed. Ost 1st Diag to 1st Diag lesion is 80% stenosed. Prox LAD to Mid LAD lesion is 60% stenosed. Dist LAD lesion is 60% stenosed. Prox Cx to Mid Cx lesion is 90% stenosed. Ost 1st Mrg to 1st Mrg lesion is 85% stenosed.   1.  Significant three-vessel coronary artery disease with  patent stent in the RCA and LAD.  There is significant proximal RCA disease before the stent as well as diffuse mid and distal disease in a vessel that appears to be about 2.5 mm in diameter.  There is significant bifurcation stenosis in the proximal left circumflex with a large OM1.  The LAD has moderate disease. The coronary arteries are moderately calcified and diffusely diseased throughout. 2.  Left ventricular angiography was not performed.  EF was moderately to severely reduced by echo. 3.  Severely elevated left ventricular end-diastolic pressure at 34 mmHg.   Recommendations: This is overall a difficult situation.  Revascularization options are somewhat limited due to diffuse disease overall.  Given that he is diabetic and has cardiomyopathy, best option might be CABG if he is found to be a candidate.  We need to optimize his heart failure first with outpatient referral to cardiothoracic surgery for evaluation.  I am going to increase intravenous diuresis today and the patient possibly can be discharged home tomorrow on medical therapy.  Laboratory Data:  High Sensitivity Troponin:   Recent Labs  Lab 07/15/21 1640 07/15/21 1814  TROPONINIHS 71* 78*     Chemistry Recent Labs  Lab 07/15/21 1640 07/16/21 0432  NA 123* 126*  K 5.1 5.2*  CL 90* 92*  CO2 26 26  GLUCOSE 98 127*  BUN 39* 45*  CREATININE 1.55* 1.71*  CALCIUM 8.1* 8.6*  MG  --  2.3  GFRNONAA 52* 46*  ANIONGAP 7 8    Recent Labs  Lab 07/15/21 1640 07/16/21 0432  PROT 6.5 6.1*  ALBUMIN 3.2* 3.3*  AST 32 25  ALT 14 14  ALKPHOS 108 108  BILITOT 2.9* 2.3*   Lipids No results for input(s): CHOL, TRIG, HDL, LABVLDL, LDLCALC, CHOLHDL in the last 168 hours.  Hematology Recent Labs  Lab 07/15/21 1640 07/16/21 0432  WBC 5.6 3.9*  RBC 3.56* 3.63*  HGB 9.0* 9.1*  HCT 28.6* 28.2*  MCV 80.3 77.7*  MCH 25.3* 25.1*  MCHC 31.5 32.3  RDW 19.9* 20.2*  PLT 169 149*   Thyroid  Recent Labs  Lab 07/16/21 0432   TSH 1.909    BNP Recent Labs  Lab 07/15/21 1640  BNP 1,987.6*    DDimer No results for input(s): DDIMER in the last 168 hours.   Radiology/Studies:  DG Chest Portable 1 View  Result Date: 07/15/2021 CLINICAL DATA:  Shortness of breath, volume overload or COPD. EXAM: PORTABLE CHEST 1 VIEW COMPARISON:  Chest radiograph dated in June 14, 2021. FINDINGS: Heart is enlarged. Atherosclerotic calcification of the aortic arch. Pulmonary vascular congestion without evidence of pulmonary edema or pleural effusion. No focal consolidation. IMPRESSION: 1. Stable cardiomegaly with atherosclerotic calcification of the aortic arch. 2. Mild pulmonary vascular congestion. No evidence of pulmonary edema or infiltrate. Electronically Signed   By: Keane Police D.O.   On: 07/15/2021 16:38     Assessment and Plan:   Acute on chronic HFrEF - BNP elevated to 1987 and CXR with mild vascular congestion - PTA torsemide 40mg  BID and IV lasix 80mg  PRN - H/o medication noncompliance - strict I/Os, daily weights - weight at last d/c on 1/10 was 182lbs. Weight on admission 208lbs, however unsure if this is accurate. - PTA losartan, spiro and coreg held for hypotension - IV lasix held for rising creatinine today - LLE noted on exam, if kidney function is better tomorrow would start IV lasix 80mg  BID, may need augmentation of diuresis given low EF. - continue digoxin, may need to hold with AKI - overall prognosis is poor  Known severe CAD - troponin elevation likely demand ischemia - not a surgical candidate in the past - prior cath he left AMA - no plan for ischemic work-up.  - continue statin; Restart BB as able.   Permanent Afib - patient is rate controlled on digoxin>>may need to hold with AKI - PTA coreg held for hypotension. Restart as able - continue  Eliquis 5mg  BID for stroke ppx  AKI Hyponatremia - trend with diuresis - nephrotoxic meds held  COPD with exacerbation - s/p solumedrol - per  IM  PAD s/p R BKA - continue Plavix and Lipitor  Tobacco use - nicotine patch  GOC - palliative care consulted  For questions or updates, please contact [place] Please consult [email or web address] for contact info under    Signed, Apolonio Cutting Ninfa Meeker, PA-C  07/16/2021 1:52 PM

## 2021-07-16 NOTE — Progress Notes (Signed)
[facility]) Hospital Liaison note:  This patient is currently enrolled in [facility] outpatient-based Palliative Care. Will continue to follow for disposition.  Please call with any outpatient palliative questions or concerns.  Thank you, Lorelee Market, LPN [facility] Liaison [phone]

## 2021-07-16 NOTE — Plan of Care (Signed)
  Problem: Education: Goal: Ability to demonstrate management of disease process will improve Outcome: Progressing   Problem: Education: Goal: Ability to verbalize understanding of medication therapies will improve Outcome: Progressing   Problem: Activity: Goal: Capacity to carry out activities will improve Outcome: Progressing   Problem: Cardiac: Goal: Ability to achieve and maintain adequate cardiopulmonary perfusion will improve Outcome: Progressing   Problem: Education: Goal: Knowledge of General Education information will improve Description: Including pain rating scale, medication(s)/side effects and non-pharmacologic comfort measures Outcome: Progressing   Problem: Health Behavior/Discharge Planning: Goal: Ability to manage health-related needs will improve Outcome: Progressing   

## 2021-07-16 NOTE — Progress Notes (Signed)
PROGRESS NOTE    [name] [name]  YNW:[id number] DOB: [birth year] DOA: 07/15/2021 PCP: [name], MD  250A/250A-AA   Assessment & Plan:   Principal Problem:   Acute on chronic HFrEF (heart failure with reduced ejection fraction) ([place]) Active Problems:   PAD (peripheral artery disease) (HCC)   Tobacco use   Type 2 diabetes mellitus with hyperlipidemia (HCC)   AKI (acute kidney injury) ([place])   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HCC)   CAD (coronary artery disease)   Hyponatremia   COPD with acute exacerbation (HCC)   Chronic anticoagulation   Hypotension   Permanent atrial fibrillation (HCC)   Prolonged QT interval   [name] [name] is a 61 y.o. male with medical history significant for HFrEF, CAD, a-fib, COPD, HTN, DMT2, OSA, tobacco use who presents for evaluation of shortness of breath and swelling.  He was seen 3 days ago at the CHF clinic and at that time he looked to be fluid overloaded and had soft blood pressure and was referred to the emergency room for further evaluation and admission but he refused at that time.  He lives with his girlfriend.  He has had increasing shortness of breath and edema of his left lower leg over the last week.  Symptoms have worsened in the last few days.  Reports that "he feels like crap" and is having generalized weakness and fatigue.  He has increased shortness of breath with minimal exertion.  Swelling in his left leg has increased.  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  Has any fever, chills, abdominal pain, nausea vomiting diarrhea, urinary symptoms.  He does have intermittent chest pain and palpitations but has not been worse than normal he is in the last few days.  He is on digoxin, Plavix, Eliquis at home and states he has been taking it.  Is not clear if he has been taking his diuretic at home.  Continues to smoke and has increased to 2 packs a day from 1 pack a day in the last 3 to 4 weeks.   Acute COPD exacerbation --dyspnea seems to be  most related to COPD exacerbation, since pt has significant wheezing, cough, and smokes 2 packs per day.  Does not appear to have PNA. --received IV solumedrol 125 mg on admission Plan: --cont steroid as prednisone 40 mg daily --DuoNeb scheduled    Acute on chronic HFrEF (heart failure with reduced ejection fraction --LVEF<20%.  Patient does not have an AICD but may benefit from one although it sounds like he did not want 1 when it was offered in the past he states. --BNP chronically elevated.  CXR showed Mild pulmonary vascular congestion. No evidence of pulmonary Edema. --Cr worsened with just 1 dose of IV lasix 40 Plan: --Hold diuretic for now --cardiology consult    AKI (acute kidney injury) --worsened with just 1 dose of IV lasix 40 --Hold diuretic     Type 2 diabetes mellitus with hyperlipidemia  Last HgbA1c was 6.1 on 06/17/21 --resume home metformin after discharge     Hypotension Pt with soft BP in ER and hx of cardiogenic shock in past. Was given dose of midodrine in ER. Monitor BP closely.  Hold antihypertensives     Permanent atrial fibrillation  Continue digoxin and eliquis     Hyponatremia Check serum osmolarity and urine sodium level.     PAD (peripheral artery disease) Chronic. --cont statin and plavix     Tobacco use Smokes 2 ppd.  --cont  nicotine patch      Prolonged QT interval Avoid medications which could further prolong QT interval     Below-knee amputation of right lower extremity   CAD (coronary artery disease)  LLE wounds --podiatry consult today   DVT prophylaxis: WI:[id number] Code Status: Full code  Family Communication:  Level of care: Progressive Dispo:   The patient is from: home Anticipated d/c is to: home Anticipated d/c date is: 2-3 days Patient currently is not medically ready to d/c due to: significant dyspnea, with COPD and CHF exacerbation.   Subjective and Interval History:  Pt reported dyspnea and cough, with sputum  production.  Didn't make more urine with IV lasix from yesterday.     Objective: Vitals:   07/16/21 0808 07/16/21 0911 07/16/21 1145 07/16/21 1632  BP: 104/68  (!) 159/118 (!) 150/67  Pulse: 80 62 69 70  Resp: 19  20 20   Temp: 98.3 F (36.8 C)  98.3 F (36.8 C) 98.6 F (37 C)  TempSrc:   Oral Oral  SpO2: 98%  92% 92%  Weight:      Height:        Intake/Output Summary (Last 24 hours) at 07/16/2021 1700 Last data filed at 07/16/2021 1600 Gross per 24 hour  Intake 1200 ml  Output 500 ml  Net 700 ml   Filed Weights   07/15/21 1550 07/15/21 2120 07/16/21 0500  Weight: 81.6 kg 94.8 kg 94.9 kg    Examination:   Constitutional: NAD, AAOx3 HEENT: conjunctivae and lids normal, EOMI CV: No cyanosis.   RESP: normal respiratory effort, diffuse wheezing Extremities: right BKA.  LLE swelling with foam dressing SKIN: warm, dry Neuro: II - XII grossly intact.   Psych: Normal mood and affect.     Data Reviewed: I have personally reviewed following labs and imaging studies  CBC: Recent Labs  Lab 07/15/21 1640 07/16/21 0432  WBC 5.6 3.9*  HGB 9.0* 9.1*  HCT 28.6* 28.2*  MCV 80.3 77.7*  PLT 169 992*   Basic Metabolic Panel: Recent Labs  Lab 07/15/21 1640 07/16/21 0432  NA 123* 126*  K 5.1 5.2*  CL 90* 92*  CO2 26 26  GLUCOSE 98 127*  BUN 39* 45*  CREATININE 1.55* 1.71*  CALCIUM 8.1* 8.6*  MG  --  2.3   GFR: Estimated Creatinine Clearance: 51.7 mL/min (A) (by C-G formula based on SCr of 1.71 mg/dL (H)). Liver Function Tests: Recent Labs  Lab 07/15/21 1640 07/16/21 0432  AST 32 25  ALT 14 14  ALKPHOS 108 108  BILITOT 2.9* 2.3*  PROT 6.5 6.1*  ALBUMIN 3.2* 3.3*   No results for input(s): LIPASE, AMYLASE in the last 168 hours. No results for input(s): AMMONIA in the last 168 hours. Coagulation Profile: Recent Labs  Lab 07/15/21 1640  INR 1.4*   Cardiac Enzymes: No results for input(s): CKTOTAL, CKMB, CKMBINDEX, TROPONINI in the last 168 hours. BNP  (last 3 results) No results for input(s): PROBNP in the last 8760 hours. HbA1C: No results for input(s): HGBA1C in the last 72 hours. CBG: No results for input(s): GLUCAP in the last 168 hours. Lipid Profile: No results for input(s): CHOL, HDL, LDLCALC, TRIG, CHOLHDL, LDLDIRECT in the last 72 hours. Thyroid Function Tests: Recent Labs    07/16/21 0432  TSH 1.909   Anemia Panel: No results for input(s): VITAMINB12, FOLATE, FERRITIN, TIBC, IRON, RETICCTPCT in the last 72 hours. Sepsis Labs: Recent Labs  Lab 07/15/21 1640 07/16/21 0432  PROCALCITON <0.10 0.11  Recent Results (from the past 240 hour(s))  Resp Panel by RT-PCR (Flu A&B, Covid) Nasopharyngeal Swab     Status: None   Collection Time: 07/15/21  4:40 PM   Specimen: Nasopharyngeal Swab; Nasopharyngeal(NP) swabs in vial transport medium  Result Value Ref Range Status   SARS Coronavirus 2 by RT PCR NEGATIVE NEGATIVE Final    Comment: (NOTE) SARS-CoV-2 target nucleic acids are NOT DETECTED.  The SARS-CoV-2 RNA is generally detectable in upper respiratory specimens during the acute phase of infection. The lowest concentration of SARS-CoV-2 viral copies this assay can detect is 138 copies/mL. A negative result does not preclude SARS-Cov-2 infection and should not be used as the sole basis for treatment or other patient management decisions. A negative result may occur with  improper specimen collection/handling, submission of specimen other than nasopharyngeal swab, presence of viral mutation(s) within the areas targeted by this assay, and inadequate number of viral copies(<138 copies/mL). A negative result must be combined with clinical observations, patient history, and epidemiological information. The expected result is Negative.  Fact Sheet for Patients:  EntrepreneurPulse.com.au  Fact Sheet for Healthcare Providers:  IncredibleEmployment.be  This test is no t yet approved  or cleared by the [place] FDA and  has been authorized for detection and/or diagnosis of SARS-CoV-2 by FDA under an Emergency Use Authorization (EUA). This EUA will remain  in effect (meaning this test can be used) for the duration of the COVID-19 declaration under Section 564(b)(1) of the Act, 21 U.S.C.section 360bbb-3(b)(1), unless the authorization is terminated  or revoked sooner.       Influenza A by PCR NEGATIVE NEGATIVE Final   Influenza B by PCR NEGATIVE NEGATIVE Final    Comment: (NOTE) The Xpert Xpress SARS-CoV-2/FLU/RSV plus assay is intended as an aid in the diagnosis of influenza from Nasopharyngeal swab specimens and should not be used as a sole basis for treatment. Nasal washings and aspirates are unacceptable for Xpert Xpress SARS-CoV-2/FLU/RSV testing.  Fact Sheet for Patients: EntrepreneurPulse.com.au  Fact Sheet for Healthcare Providers: IncredibleEmployment.be  This test is not yet approved or cleared by the [place] FDA and has been authorized for detection and/or diagnosis of SARS-CoV-2 by FDA under an Emergency Use Authorization (EUA). This EUA will remain in effect (meaning this test can be used) for the duration of the COVID-19 declaration under Section 564(b)(1) of the Act, 21 U.S.C. section 360bbb-3(b)(1), unless the authorization is terminated or revoked.  Performed at [facility], [address], [place], [place] [address]       Radiology Studies: DG Chest Portable 1 View  Result Date: 07/15/2021 CLINICAL DATA:  Shortness of breath, volume overload or COPD. EXAM: PORTABLE CHEST 1 VIEW COMPARISON:  Chest radiograph dated in June 14, 2021. FINDINGS: Heart is enlarged. Atherosclerotic calcification of the aortic arch. Pulmonary vascular congestion without evidence of pulmonary edema or pleural effusion. No focal consolidation. IMPRESSION: 1. Stable cardiomegaly with atherosclerotic  calcification of the aortic arch. 2. Mild pulmonary vascular congestion. No evidence of pulmonary edema or infiltrate. Electronically Signed   By: Keane Police D.O.   On: 07/15/2021 16:38     Scheduled Meds:  apixaban  5 mg Oral BID   atorvastatin  80 mg Oral Daily   clopidogrel  75 mg Oral Daily   digoxin  125 mcg Oral Daily   DULoxetine  60 mg Oral Daily   ezetimibe  10 mg Oral Daily   fluticasone furoate-vilanterol  1 puff Inhalation Daily   And  umeclidinium bromide  1 puff Inhalation Daily   gabapentin  800 mg Oral QID   ipratropium-albuterol  6 mL Nebulization QID   metFORMIN  1,000 mg Oral BID WC   multivitamin with minerals  1 tablet Oral Daily   nicotine  21 mg Transdermal Daily   pantoprazole  40 mg Oral Daily   predniSONE  40 mg Oral Q breakfast   sodium chloride flush  3 mL Intravenous Q12H   tamsulosin  0.4 mg Oral QPC supper   Vericiguat  1 tablet Oral Daily   Continuous Infusions:  sodium chloride       LOS: 1 day     Enzo Bi, MD [facility] If 7PM-7AM, please contact night-coverage 07/16/2021, 5:00 PM

## 2021-07-17 ENCOUNTER — Inpatient Hospital Stay: Payer: Self-pay

## 2021-07-17 ENCOUNTER — Encounter
Admission: EM | Disposition: A | Discharge status: Home / Self Care | Payer: Self-pay | Source: Home / Self Care | Attending: Internal Medicine

## 2021-07-17 ENCOUNTER — Encounter: Payer: Self-pay | Admitting: Internal Medicine

## 2021-07-17 ENCOUNTER — Inpatient Hospital Stay: Payer: Medicare Other

## 2021-07-17 ENCOUNTER — Encounter: Payer: Self-pay | Admitting: Physician Assistant

## 2021-07-17 DIAGNOSIS — I2510 Atherosclerotic heart disease of native coronary artery without angina pectoris: Secondary | ICD-10-CM | POA: Diagnosis not present

## 2021-07-17 DIAGNOSIS — I5023 Acute on chronic systolic (congestive) heart failure: Secondary | ICD-10-CM | POA: Diagnosis not present

## 2021-07-17 HISTORY — PX: RIGHT HEART CATH AND CORONARY ANGIOGRAPHY: CATH118264

## 2021-07-17 LAB — MRSA NEXT GEN BY PCR, NASAL: MRSA by PCR Next Gen: NOT DETECTED

## 2021-07-17 LAB — CBC
HCT: 27.8 % — ABNORMAL LOW (ref 39.0–52.0)
Hemoglobin: 9.1 g/dL — ABNORMAL LOW (ref 13.0–17.0)
MCH: 25.8 pg — ABNORMAL LOW (ref 26.0–34.0)
MCHC: 32.7 g/dL (ref 30.0–36.0)
MCV: 78.8 fL — ABNORMAL LOW (ref 80.0–100.0)
Platelets: 184 10*3/uL (ref 150–400)
RBC: 3.53 MIL/uL — ABNORMAL LOW (ref 4.22–5.81)
RDW: 19.9 % — ABNORMAL HIGH (ref 11.5–15.5)
WBC: 8.2 10*3/uL (ref 4.0–10.5)
nRBC: 0 % (ref 0.0–0.2)

## 2021-07-17 LAB — BASIC METABOLIC PANEL
Anion gap: 10 (ref 5–15)
Anion gap: 9 (ref 5–15)
BUN: 62 mg/dL — ABNORMAL HIGH (ref 6–20)
BUN: 65 mg/dL — ABNORMAL HIGH (ref 6–20)
CO2: 24 mmol/L (ref 22–32)
CO2: 26 mmol/L (ref 22–32)
Calcium: 8.5 mg/dL — ABNORMAL LOW (ref 8.9–10.3)
Calcium: 8.7 mg/dL — ABNORMAL LOW (ref 8.9–10.3)
Chloride: 87 mmol/L — ABNORMAL LOW (ref 98–111)
Chloride: 86 mmol/L — ABNORMAL LOW (ref 98–111)
Creatinine, Ser: 2.02 mg/dL — ABNORMAL HIGH (ref 0.61–1.24)
Creatinine, Ser: 2.06 mg/dL — ABNORMAL HIGH (ref 0.61–1.24)
GFR, Estimated: 38 mL/min — ABNORMAL LOW (ref >60)
GFR, Estimated: 37 mL/min — ABNORMAL LOW (ref >60)
Glucose, Bld: 162 mg/dL — ABNORMAL HIGH (ref 70–99)
Glucose, Bld: 252 mg/dL — ABNORMAL HIGH (ref 70–99)
Potassium: 5.5 mmol/L — ABNORMAL HIGH (ref 3.5–5.1)
Potassium: 5.4 mmol/L — ABNORMAL HIGH (ref 3.5–5.1)
Sodium: 121 mmol/L — ABNORMAL LOW (ref 135–145)
Sodium: 121 mmol/L — ABNORMAL LOW (ref 135–145)

## 2021-07-17 LAB — COOXEMETRY PANEL
Carboxyhemoglobin: 2 % — ABNORMAL HIGH (ref 0.5–1.5)
Methemoglobin: 0.5 % (ref 0.0–1.5)
O2 Saturation: 88.5 %

## 2021-07-17 LAB — GLUCOSE, CAPILLARY
Glucose-Capillary: 191 mg/dL — ABNORMAL HIGH (ref 70–99)
Glucose-Capillary: 181 mg/dL — ABNORMAL HIGH (ref 70–99)
Glucose-Capillary: 233 mg/dL — ABNORMAL HIGH (ref 70–99)
Glucose-Capillary: 217 mg/dL — ABNORMAL HIGH (ref 70–99)

## 2021-07-17 LAB — MAGNESIUM: Magnesium: 2.4 mg/dL (ref 1.7–2.4)

## 2021-07-17 SURGERY — RIGHT HEART CATH AND CORONARY ANGIOGRAPHY
Anesthesia: Moderate Sedation

## 2021-07-17 MED ORDER — IOHEXOL 300 MG/ML  SOLN
INTRAMUSCULAR | Status: DC | PRN
  Administered 2021-07-17: 16 mL

## 2021-07-17 MED ORDER — FUROSEMIDE 10 MG/ML IJ SOLN
INTRAMUSCULAR | Status: AC
  Filled 2021-07-17: qty 4

## 2021-07-17 MED ORDER — NITROGLYCERIN 0.4 MG SL SUBL
SUBLINGUAL_TABLET | SUBLINGUAL | Status: AC
  Filled 2021-07-17: qty 1

## 2021-07-17 MED ORDER — MILRINONE LACTATE IN DEXTROSE 20-5 MG/100ML-% IV SOLN
0.2500 ug/kg/min | INTRAVENOUS | Status: DC
  Administered 2021-07-17: 0.25 ug/kg/min via INTRAVENOUS
  Administered 2021-07-18: 0.125 ug/kg/min via INTRAVENOUS
  Administered 2021-07-18: 0.25 ug/kg/min via INTRAVENOUS
  Administered 2021-07-20: 0.125 ug/kg/min via INTRAVENOUS
  Filled 2021-07-17 (×4): qty 100

## 2021-07-17 MED ORDER — SODIUM CHLORIDE 0.9% FLUSH
3.0000 mL | Freq: Two times a day (BID) | INTRAVENOUS | Status: DC
  Administered 2021-07-17 – 2021-07-18 (×3): 3 mL via INTRAVENOUS

## 2021-07-17 MED ORDER — SODIUM CHLORIDE 0.9 % IV SOLN: 250.0000 mL | INTRAVENOUS | Status: DC | PRN

## 2021-07-17 MED ORDER — SODIUM CHLORIDE 0.9% FLUSH: 10.0000 mL | INTRAVENOUS | Status: DC | PRN

## 2021-07-17 MED ORDER — HYDRALAZINE HCL 20 MG/ML IJ SOLN: 10.0000 mg | INTRAMUSCULAR | Status: AC | PRN

## 2021-07-17 MED ORDER — VERAPAMIL HCL 2.5 MG/ML IV SOLN
INTRAVENOUS | Status: AC
  Filled 2021-07-17: qty 2

## 2021-07-17 MED ORDER — APIXABAN 5 MG PO TABS
5.0000 mg | ORAL_TABLET | Freq: Two times a day (BID) | ORAL | Status: DC
  Administered 2021-07-18 – 2021-07-20 (×6): 5 mg via ORAL
  Filled 2021-07-17 (×6): qty 1

## 2021-07-17 MED ORDER — SODIUM CHLORIDE 0.9% FLUSH: 3.0000 mL | INTRAVENOUS | Status: DC | PRN

## 2021-07-17 MED ORDER — SODIUM CHLORIDE 0.9 % IV SOLN
250.0000 mL | INTRAVENOUS | Status: DC
  Administered 2021-07-17: 250 mL via INTRAVENOUS

## 2021-07-17 MED ORDER — HEPARIN (PORCINE) IN NACL 1000-0.9 UT/500ML-% IV SOLN
INTRAVENOUS | Status: AC
  Filled 2021-07-17: qty 1000

## 2021-07-17 MED ORDER — APIXABAN 5 MG PO TABS: 5.0000 mg | ORAL_TABLET | Freq: Two times a day (BID) | ORAL | Status: DC

## 2021-07-17 MED ORDER — SODIUM CHLORIDE 0.9% FLUSH
10.0000 mL | Freq: Two times a day (BID) | INTRAVENOUS | Status: DC
  Administered 2021-07-17: 20 mL
  Administered 2021-07-18: 10 mL

## 2021-07-17 MED ORDER — HEPARIN SODIUM (PORCINE) 1000 UNIT/ML IJ SOLN
INTRAMUSCULAR | Status: AC
  Filled 2021-07-17: qty 10

## 2021-07-17 MED ORDER — VERAPAMIL HCL 2.5 MG/ML IV SOLN
INTRAVENOUS | Status: DC | PRN
  Administered 2021-07-17: 2.5 mg via INTRA_ARTERIAL

## 2021-07-17 MED ORDER — HEPARIN (PORCINE) IN NACL 2000-0.9 UNIT/L-% IV SOLN
INTRAVENOUS | Status: DC | PRN
  Administered 2021-07-17: 1000 mL

## 2021-07-17 MED ORDER — IPRATROPIUM-ALBUTEROL 0.5-2.5 (3) MG/3ML IN SOLN
3.0000 mL | Freq: Four times a day (QID) | RESPIRATORY_TRACT | Status: DC
  Administered 2021-07-17 – 2021-07-19 (×6): 3 mL via RESPIRATORY_TRACT
  Filled 2021-07-17 (×6): qty 3

## 2021-07-17 MED ORDER — NOREPINEPHRINE 4 MG/250ML-% IV SOLN
0.0000 ug/min | INTRAVENOUS | Status: DC
  Administered 2021-07-17: 2 ug/min via INTRAVENOUS
  Administered 2021-07-17: 15:00:00 5 ug/min via INTRAVENOUS
  Filled 2021-07-17: qty 250

## 2021-07-17 MED ORDER — CHLORHEXIDINE GLUCONATE CLOTH 2 % EX PADS
6.0000 | MEDICATED_PAD | Freq: Every day | CUTANEOUS | Status: DC
  Administered 2021-07-17 – 2021-07-20 (×4): 6 via TOPICAL

## 2021-07-17 MED ORDER — HEPARIN SODIUM (PORCINE) 1000 UNIT/ML IJ SOLN
INTRAMUSCULAR | Status: DC | PRN
  Administered 2021-07-17: 4000 [IU] via INTRAVENOUS

## 2021-07-17 MED ORDER — FENTANYL CITRATE (PF) 100 MCG/2ML IJ SOLN
INTRAMUSCULAR | Status: AC
  Filled 2021-07-17: qty 2

## 2021-07-17 MED ORDER — FUROSEMIDE 10 MG/ML IJ SOLN
INTRAMUSCULAR | Status: DC | PRN
  Administered 2021-07-17: 80 mg via INTRAVENOUS

## 2021-07-17 MED ORDER — MIDAZOLAM HCL 2 MG/2ML IJ SOLN
INTRAMUSCULAR | Status: AC
  Filled 2021-07-17: qty 2

## 2021-07-17 MED ORDER — FUROSEMIDE 10 MG/ML IJ SOLN
80.0000 mg | Freq: Two times a day (BID) | INTRAMUSCULAR | Status: DC
  Administered 2021-07-17 – 2021-07-21 (×8): 80 mg via INTRAVENOUS
  Filled 2021-07-17 (×9): qty 8

## 2021-07-17 MED ORDER — SODIUM CHLORIDE 0.9 % IV SOLN: INTRAVENOUS | Status: DC

## 2021-07-17 MED ORDER — NOREPINEPHRINE 4 MG/250ML-% IV SOLN: 0.0000 ug/min | INTRAVENOUS | Status: DC

## 2021-07-17 MED ORDER — INSULIN ASPART 100 UNIT/ML IJ SOLN
0.0000 [IU] | Freq: Every day | INTRAMUSCULAR | Status: DC
  Administered 2021-07-17: 2 [IU] via SUBCUTANEOUS
  Filled 2021-07-17: qty 1

## 2021-07-17 MED ORDER — INSULIN ASPART 100 UNIT/ML IJ SOLN
0.0000 [IU] | Freq: Three times a day (TID) | INTRAMUSCULAR | Status: DC
  Administered 2021-07-17: 5 [IU] via SUBCUTANEOUS
  Administered 2021-07-18 – 2021-07-19 (×6): 3 [IU] via SUBCUTANEOUS
  Administered 2021-07-20 (×2): 2 [IU] via SUBCUTANEOUS
  Filled 2021-07-17 (×9): qty 1

## 2021-07-17 MED ORDER — SODIUM CHLORIDE 0.9% FLUSH: 3.0000 mL | Freq: Two times a day (BID) | INTRAVENOUS | Status: DC

## 2021-07-17 SURGICAL SUPPLY — 13 items
CATH 5FR JL3.5 JR4 ANG PIG MP (CATHETERS) ×1 IMPLANT
CATH SWAN GANZ 7F STRAIGHT (CATHETERS) ×1 IMPLANT
DEVICE RAD TR BAND REGULAR (VASCULAR PRODUCTS) ×1 IMPLANT
DRAPE BRACHIAL (DRAPES) ×2 IMPLANT
GLIDESHEATH SLEND SS 6F .021 (SHEATH) ×1 IMPLANT
GLIDESHEATH SLENDER 7FR .021G (SHEATH) ×1 IMPLANT
GUIDEWIRE INQWIRE 1.5J.035X260 (WIRE) IMPLANT
INQWIRE 1.5J .035X260CM (WIRE) ×2
PACK CARDIAC CATH (CUSTOM PROCEDURE TRAY) ×2 IMPLANT
PROTECTION STATION PRESSURIZED (MISCELLANEOUS) ×2
SET ATX SIMPLICITY (MISCELLANEOUS) ×1 IMPLANT
STATION PROTECTION PRESSURIZED (MISCELLANEOUS) IMPLANT
WIRE HITORQ VERSACORE ST 145CM (WIRE) ×1 IMPLANT

## 2021-07-17 NOTE — Progress Notes (Signed)
Peripherally Inserted Central Catheter Placement  The IV Nurse has discussed with the patient and/or persons authorized to consent for the patient, the purpose of this procedure and the potential benefits and risks involved with this procedure.  The benefits include less needle sticks, lab draws from the catheter, and the patient may be discharged home with the catheter. Risks include, but not limited to, infection, bleeding, blood clot (thrombus formation), and puncture of an artery; nerve damage and irregular heartbeat and possibility to perform a PICC exchange if needed/ordered by physician.  Alternatives to this procedure were also discussed.  Bard Power PICC patient education guide, fact sheet on infection prevention and patient information card has been provided to patient /or left at bedside.    PICC Placement Documentation  PICC Double Lumen 44/62/86 PICC Left Basilic 46 cm 0 cm (Active)  Indication for Insertion or Continuance of Line Vasoactive infusions 07/17/21 1701  Exposed Catheter (cm) 0 cm 07/17/21 1701  Site Assessment Clean;Dry;Intact;Bleeding 07/17/21 1701  Lumen #1 Status Flushed;Saline locked;Blood return noted 07/17/21 1701  Lumen #2 Status Flushed;Saline locked;Blood return noted 07/17/21 1701  Dressing Type Securing device;Gauze 07/17/21 1701  Dressing Status Clean;Dry;Intact 07/17/21 1701  Antimicrobial disc in place? Yes 07/17/21 1701  Safety Lock Not Applicable 38/17/71 1657  Dressing Intervention New dressing;Other (Comment) 07/17/21 1701    Patient's girlfriend, Pamella Pert, signed consent for patient.    Enos Fling 07/17/2021, 5:05 PM

## 2021-07-17 NOTE — Consult Note (Signed)
NAME:  [name] [name], MRN:  [id number], DOB:  08/19/1960, LOS: 2 ADMISSION DATE:  07/15/2021, CONSULTATION DATE:  07/17/2021 REFERRING MD:  Dr. Billie Ruddy, CHIEF COMPLAINT:  Shortness of Breath & Edema   Brief Pt Description / Synopsis:  61 y.o. Male admitted with Acute on Chronic HFrEF (LVEF <20%) with Cardiogenic shock and concern for developing Cardiorenal Syndrome.  Being placed on Milrinone and Levophed infusions.  History of Present Illness:  [name] [name] is a 61 year old male with a past medical history significant for severe multivessel CAD, HFrEF (LVEF less than 20%), pulmonary hypertension, permanent atrial fibrillation on Eliquis, hypertension, hyperlipidemia, and chronic kidney disease who presented to [facility] ED on 07/15/2021 due to complaints of shortness of breath and lower extremity edema.  He was seen at the CHF clinic approximately 3 days ago where he appeared to be fluid overloaded with soft blood pressures.  He was referred to the ED at that time for further evaluation, but he refused to seek medical evaluation at that time.  He reported increasing shortness of breath and lower extremity edema, along with associated generalized malaise and fatigue.  He denied fever, chills, abdominal pain, nausea, vomiting, diarrhea, dysuria.  He reported he has been compliant with his home digoxin, Plavix, Eliquis.  It was unclear if he had been taking his diuretic as prescribed.  He also endorses that he continues to smoke, and has increased to 2 packs a day from 1 pack a day in the last 3 to 4 weeks.  Denied alcohol or illicit drug use.  Hospitalist were asked to admit for acute on chronic HFrEF, acute COPD exacerbation, and AKI on CKD.  Cardiology was consulted for assistance with CHF exacerbation.  Podiatry and vascular surgery consulted for left lower extremity wounds.  Interval History Despite IV diuresis, he failed to show much improvement in symptoms.  Diuresis had to be held yesterday 07/16/2021 due to  worsening renal function.  Despite Lasix being held yesterday, BUN and creatinine have continued to rise, along with worsening shortness of breath and abdominal distention.  Due to concern for low output heart failure with developing cardiorenal syndrome, he underwent right heart catheterization on 2/7.  Cath showed severe multivessel coronary artery disease similar to prior cath in 01/28/2020, along with severely elevated left heart, right heart, and pulmonary artery pressures.  Decision was made to transfer him to ICU for placement of PICC line with initiation of milrinone and Levophed infusions.    PCCM is consulted for assistance with vasopressors.  Pertinent  Medical History  Severe multivessel coronary artery disease Chronic HFrEF (LVEF less than 20%) Permanent atrial fibrillation on Eliquis Peripheral artery disease status post right BKA Hypertension Hyperlipidemia COPD Pulmonary artery hypertension Sleep apnea Chronic kidney disease Diabetes mellitus type 2 BPH Arthritis Diabetic neuropathy  Micro Data:  2/5: SARS-CoV-2 and influenza PCR>> negative  Antimicrobials:  N/A  Significant Hospital Events: Including procedures, antibiotic start and stop dates in addition to other pertinent events   2/5: Presented to ED with complaints of shortness of breath and lower extremity edema.  Admitted by the hospitalist 2/6: Cardiology, podiatry and vascular surgery consulted. LLE wounds debrided. Diuretics held due to worsening AKI 2/7: Cardiac catheterization performed due to concern for low output heart failure and developing cardiorenal syndrome.  Returns to the ICU post cath requiring Levophed with plan to place PICC line and start milrinone infusion.  PCCM consulted  Interim History / Subjective:  -Patient with concerns for low output heart failure and  developing cardiorenal syndrome -Underwent cardiac catheterization -Transfer to ICU post cath for Levophed, also plan to place PICC  line and start milrinone infusion -Patient reports fatigue and feeling tired -Currently denies dizziness, chest pain, shortness of breath, wheezing -Wants to eat  Objective   Blood pressure (!) 112/55, pulse 75, temperature (!) 97.4 F (36.3 C), temperature source Oral, resp. rate 15, height 5\' 7"  (1.702 m), weight 94.4 kg, SpO2 92 %.        Intake/Output Summary (Last 24 hours) at 07/17/2021 1441 Last data filed at 07/17/2021 0820 Gross per 24 hour  Intake 1200 ml  Output 1500 ml  Net -300 ml   Filed Weights   07/16/21 0500 07/17/21 0533 07/17/21 1023  Weight: 94.9 kg 94.4 kg 94.4 kg    Examination: General: Acute on chronically ill-appearing male, sitting in bed, on room air, no acute distress HENT: Atraumatic, normocephalic, neck supple, positive JVD Lungs: Fine crackles bilateral upper fields, diminished bilateral lower fields with mild expiratory wheeze, even, nonlabored, no accessory muscle use Cardiovascular: Regular rate and rhythm, no murmurs, rubs, gallops Abdomen: Obese, soft, nontender, nondistended, no guarding rebound tenderness, bowel sounds positive x4 Extremities: Right BKA, LLE with chronic wounds Neuro: Awake and alert, oriented x3, follows commands, no focal deficits, speech clear GU: Deferred  Resolved Hospital Problem list     Assessment & Plan:   Cardiogenic Shock Acute on Chronic HFrEF (LVEF <20% on Echo 06/15/21) Mildly Elevated Troponin, suspect demand ischemia Permanent A. Fib on Eliquis PMHx: severe CAD, PAD, tobacco use (smokes 2 PPD) -Continuous cardiac monitoring -Maintain MAP >65 -Vasopressors as needed to maintain MAP goal -Cardiology following, appreciate input -Plan to start milrinone infusion per cardiology ~ follow Co-ox panel -Hold antihypertensives -Diuresis as blood pressure and renal function permits ~currently on Lasix 80 mg twice daily -Continue Eliquis for anticoagulation -Echo 06/15/21: LVEF <20%, indeterminate diastolic  parameters, RV systolic function severely reduced, severely elevated pulmonary artery systolic pressure -Cardiac Cath 2/7: Severe multivessel coronary artery disease, similar to prior catheterization from 01/2020. Severely elevated left heart, right heart, and pulmonary artery pressures. Moderately-severely reduced Fick cardiac output/index.  Acute Kidney Injury on CKD Concern for developing Cardiorenal Syndrome Hyponatremia, suspect Hypotonic Hypervolemic in setting of CHF Hyperkalemia PMHx: BPH -Monitor I&O's / urinary output -Follow BMP -Ensure adequate renal perfusion -Avoid nephrotoxic agents as able -Replace electrolytes as indicated -Inotropes as above -Diuresis as renal function and blood pressure permits -Consider nephrology consult -Continue Flomax  Acute COPD exacerbation PMHx: Pulmonary hypertension -Supplemental O2 as needed to maintain O2 sats 88 to 92% -Follow intermittent chest x-ray and ABG as needed -Continue bronchodilators, Breo Ellipta, Incruse Ellipta -Continue prednisone 40 mg daily  LLE wound PMHx: PAD s/p BKA of RLE -Continue Plavix and Lipitor -Podiatry following, appreciate input ~ s/p Debridement of wounds on 2/6, further wound care as per Podiatry -Vascular Surgery following, appreciate input ~ tentative plan for angiogram on Wednesday 2/8  Diabetes Mellitus Type II -CBG's ac &hs ; Target range of 140 to 180 -SSI -Follow ICU Hypo/Hyperglycemia protocol     Pt is critically ill with severe HFrEF and concern for developing Cardiorenal syndrome, superimposed on multiple chronic co-morbidities.  Being placed on inotropes.  Prognosis is guarded, high risk for cardiac arrest and death.  Overall long term prognosis is poor.  Recommend DNR status.  Consider Palliative Care consult.  Best Practice (right click and "Reselect all SmartList Selections" daily)   Diet/type: Regular consistency (see orders) DVT prophylaxis: DOAC GI prophylaxis: PPI  Lines:  N/A Foley:  N/A Code Status:  full code Last date of multidisciplinary goals of care discussion [N/A]  Pt's significant other updated at bedside 07/17/21.  All questions answered to their satisfaction.  Labs   CBC: Recent Labs  Lab 07/15/21 1640 07/16/21 0432 07/17/21 0603  WBC 5.6 3.9* 8.2  HGB 9.0* 9.1* 9.1*  HCT 28.6* 28.2* 27.8*  MCV 80.3 77.7* 78.8*  PLT 169 149* 981    Basic Metabolic Panel: Recent Labs  Lab 07/15/21 1640 07/16/21 0432 07/17/21 0603  NA 123* 126* 121*  K 5.1 5.2* 5.5*  CL 90* 92* 87*  CO2 26 26 24   GLUCOSE 98 127* 162*  BUN 39* 45* 62*  CREATININE 1.55* 1.71* 2.02*  CALCIUM 8.1* 8.6* 8.5*  MG  --  2.3 2.4   GFR: Estimated Creatinine Clearance: 43.6 mL/min (A) (by C-G formula based on SCr of 2.02 mg/dL (H)). Recent Labs  Lab 07/15/21 1640 07/16/21 0432 07/17/21 0603  PROCALCITON <0.10 0.11  --   WBC 5.6 3.9* 8.2    Liver Function Tests: Recent Labs  Lab 07/15/21 1640 07/16/21 0432  AST 32 25  ALT 14 14  ALKPHOS 108 108  BILITOT 2.9* 2.3*  PROT 6.5 6.1*  ALBUMIN 3.2* 3.3*   No results for input(s): LIPASE, AMYLASE in the last 168 hours. No results for input(s): AMMONIA in the last 168 hours.  ABG    Component Value Date/Time   HCO3 36.4 (H) 08/30/2019 2158   O2SAT 79.2 06/21/2020 0602     Coagulation Profile: Recent Labs  Lab 07/15/21 1640  INR 1.4*    Cardiac Enzymes: No results for input(s): CKTOTAL, CKMB, CKMBINDEX, TROPONINI in the last 168 hours.  HbA1C: Hemoglobin A1C  Date/Time Value Ref Range Status  04/04/2013 04:16 AM 9.8 (H) 4.2 - 6.3 % Final    Comment:    The American Diabetes Association recommends that a primary goal of therapy should be <7% and that physicians should reevaluate the treatment regimen in patients with HbA1c values consistently >8%.    Hgb A1c MFr Bld  Date/Time Value Ref Range Status  06/17/2021 05:56 AM 6.1 (H) 4.8 - 5.6 % Final    Comment:    (NOTE) Pre diabetes:           5.7%-6.4%  Diabetes:              >6.4%  Glycemic control for   <7.0% adults with diabetes   03/18/2021 11:10 AM 6.2 (H) 4.8 - 5.6 % Final    Comment:    (NOTE)         Prediabetes: 5.7 - 6.4         Diabetes: >6.4         Glycemic control for adults with diabetes: <7.0     CBG: Recent Labs  Lab 07/17/21 1030 07/17/21 1408  GLUCAP 191* 181*    Review of Systems:   Positives in BOLD: Gen: Denies fever, chills, weight change, fatigue, night sweats HEENT: Denies blurred vision, double vision, hearing loss, tinnitus, sinus congestion, rhinorrhea, sore throat, neck stiffness, dysphagia PULM: Denies shortness of breath, cough, sputum production, hemoptysis, wheezing CV: Denies chest pain, edema, orthopnea, paroxysmal nocturnal dyspnea, palpitations GI: Denies abdominal pain, nausea, vomiting, diarrhea, hematochezia, melena, constipation, change in bowel habits GU: Denies dysuria, hematuria, polyuria, oliguria, urethral discharge Endocrine: Denies hot or cold intolerance, polyuria, polyphagia or appetite change Derm: Denies rash, dry skin, scaling or peeling skin change Heme: Denies easy bruising,  bleeding, bleeding gums Neuro: Denies headache, numbness, weakness, slurred speech, loss of memory or consciousness   Past Medical History:  He,  has a past medical history of Acute on chronic heart failure ([place]), AKI (acute kidney injury) ([place]) (01/03/2018), Angina at rest [facility]) (08/13/2017), Arthritis, Asthma, Atherosclerosis, BPH (benign prostatic hyperplasia), Carotid arterial disease ([place]), Cellulitis of foot, left (09/27/2019), Charcot's joint of foot, right, Chronic combined systolic (congestive) and diastolic (congestive) heart failure ([place]), Chronic foot pain (Secondary Area of Pain) (Right) (01/29/2018), COPD (chronic obstructive pulmonary disease) ([place]), COPD with acute exacerbation ([place]) (01/30/2020), Coronary artery disease, Diabetic neuropathy ([place]), Diabetic ulcer of right  foot ([place]), Hernia, HFrEF (heart failure with reduced ejection fraction) ([place]) (01/30/2020), Hyperlipidemia, Hypertension, Ischemic cardiomyopathy, Morbid obesity ([place]), Neuropathy, PAD (peripheral artery disease) ([place]), Persistent atrial fibrillation ([place]), Restless leg syndrome, Sepsis ([place]) (03/30/2018), Sleep apnea, Subclavian artery stenosis, right ([place]), Syncope and collapse, Tobacco use, Type II diabetes mellitus ([place]), Unstable angina ([place]) (07/11/2016), and Varicose vein.   Surgical History:   Past Surgical History:  Procedure Laterality Date   ABDOMINAL AORTAGRAM N/A 06/23/2013   Procedure: ABDOMINAL Maxcine Ham;  Surgeon: Wellington Hampshire, MD;  Location: [place] CATH LAB;  Service: Cardiovascular;  Laterality: N/A;   AMPUTATION Right 04/01/2018   Procedure: AMPUTATION BELOW KNEE;  Surgeon: Algernon Huxley, MD;  Location: [facility] ORS;  Service: Vascular;  Laterality: Right;   AMPUTATION TOE Left 08/11/2020   Procedure: AMPUTATION TOE-2nd Toe;  Surgeon: Samara Deist, DPM;  Location: [facility] ORS;  Service: Podiatry;  Laterality: Left;   APPENDECTOMY     CARDIAC CATHETERIZATION  10/14   [place]; X1 STENT PROXIMAL RCA   CARDIAC CATHETERIZATION  02/2010   [facility]   CARDIAC CATHETERIZATION  02/21/2014   [facility]   CLAVICLE SURGERY     HERNIA REPAIR     IRRIGATION AND DEBRIDEMENT FOOT Right 01/04/2018   Procedure: IRRIGATION AND DEBRIDEMENT FOOT;  Surgeon: Samara Deist, DPM;  Location: [facility] ORS;  Service: Podiatry;  Laterality: Right;   LEFT HEART CATH AND CORONARY ANGIOGRAPHY N/A 01/20/2020   Procedure: LEFT HEART CATH AND CORONARY ANGIOGRAPHY;  Surgeon: Wellington Hampshire, MD;  Location: [place] CV LAB;  Service: Cardiovascular;  Laterality: N/A;   LEFT HEART CATH AND CORS/GRAFTS ANGIOGRAPHY N/A 04/28/2017   Procedure: LEFT HEART CATH AND CORONARY ANGIOGRAPHY;  Surgeon: Wellington Hampshire, MD;  Location: [place] CV LAB;  Service: Cardiovascular;  Laterality: N/A;   LOWER EXTREMITY ANGIOGRAPHY Right  03/03/2018   Procedure: LOWER EXTREMITY ANGIOGRAPHY;  Surgeon: Katha Cabal, MD;  Location: [place] CV LAB;  Service: Cardiovascular;  Laterality: Right;   LOWER EXTREMITY ANGIOGRAPHY Left 08/24/2018   Procedure: LOWER EXTREMITY ANGIOGRAPHY;  Surgeon: Algernon Huxley, MD;  Location: [place] CV LAB;  Service: Cardiovascular;  Laterality: Left;   LOWER EXTREMITY ANGIOGRAPHY Left 06/14/2019   Procedure: LOWER EXTREMITY ANGIOGRAPHY;  Surgeon: Algernon Huxley, MD;  Location: [place] CV LAB;  Service: Cardiovascular;  Laterality: Left;   LOWER EXTREMITY ANGIOGRAPHY Left 08/02/2019   Procedure: LOWER EXTREMITY ANGIOGRAPHY;  Surgeon: Algernon Huxley, MD;  Location: [place] CV LAB;  Service: Cardiovascular;  Laterality: Left;   LOWER EXTREMITY ANGIOGRAPHY Left 07/17/2020   Procedure: LOWER EXTREMITY ANGIOGRAPHY;  Surgeon: Algernon Huxley, MD;  Location: [place] CV LAB;  Service: Cardiovascular;  Laterality: Left;   PERIPHERAL ARTERIAL STENT GRAFT     x2 left/right   PERIPHERAL VASCULAR BALLOON ANGIOPLASTY Right 01/06/2018   Procedure: PERIPHERAL VASCULAR BALLOON ANGIOPLASTY;  Surgeon:  Schnier, Dolores Lory, MD;  Location: [place] CV LAB;  Service: Cardiovascular;  Laterality: Right;   PERIPHERAL VASCULAR CATHETERIZATION N/A 07/06/2015   Procedure: Abdominal Aortogram w/Lower Extremity;  Surgeon: Algernon Huxley, MD;  Location: [place] CV LAB;  Service: Cardiovascular;  Laterality: N/A;   PERIPHERAL VASCULAR CATHETERIZATION  07/06/2015   Procedure: Lower Extremity Intervention;  Surgeon: Algernon Huxley, MD;  Location: [place] CV LAB;  Service: Cardiovascular;;     Social History:   reports that he has been smoking cigarettes. He has a 41.00 pack-year smoking history. He has never used smokeless tobacco. He reports that he does not drink alcohol and does not use drugs.   Family History:  His family history includes Heart attack in his father; Heart disease in his father;  Hyperlipidemia in his father and mother; Hypertension in his father and mother.   Allergies Allergies  Allergen Reactions   Dulaglutide Anaphylaxis, Diarrhea and Hives   Ace Inhibitors     hypotension   Other Itching    Skin itching associated with nitro patch   Prednisone Rash     Home Medications  Prior to Admission medications   Medication Sig Start Date End Date Taking? Authorizing Provider  albuterol (VENTOLIN HFA) 108 (90 Base) MCG/ACT inhaler Inhale into the lungs every 6 (six) hours as needed for wheezing or shortness of breath.   Yes [provider]  apixaban (ELIQUIS) 5 MG TABS tablet Take 5 mg by mouth 2 (two) times daily.   Yes [provider]  atorvastatin (LIPITOR) 80 MG tablet TAKE 1 TABLET BY MOUTH ONCE DAILY 06/12/21  Yes Gollan, Kathlene November, MD  carvedilol (COREG) 3.125 MG tablet TAKE 1 TABLET BY MOUTH TWICE DAILY WITH MEALS 06/12/21  Yes Gollan, Kathlene November, MD  clopidogrel (PLAVIX) 75 MG tablet TAKE 1 TABLET BY MOUTH ONCE DAILY 06/12/21  Yes Gollan, Kathlene November, MD  digoxin (LANOXIN) 0.125 MG tablet TAKE 1 TABLET BY MOUTH ONCE DAILY 06/12/21  Yes Gollan, Kathlene November, MD  DULoxetine (CYMBALTA) 60 MG capsule Take 60 mg by mouth daily.   Yes [provider]  ezetimibe (ZETIA) 10 MG tablet Take 1 tablet (10 mg total) by mouth daily. 10/09/20  Yes Minna Merritts, MD  gabapentin (NEURONTIN) 800 MG tablet Take 800 mg by mouth 4 (four) times daily.  08/14/17 04/04/29 Yes [provider]  ipratropium-albuterol (DUONEB) 0.5-2.5 (3) MG/3ML SOLN Inhale 3 mLs into the lungs every 6 (six) hours as needed (wheezing/sob).    Yes [provider]  losartan (COZAAR) 25 MG tablet Take 0.5 tablets (12.5 mg total) by mouth daily. 10/09/20  Yes Minna Merritts, MD  metFORMIN (GLUCOPHAGE-XR) 500 MG 24 hr tablet Take 1,000 mg by mouth 2 (two) times daily. 01/02/21  Yes [provider]  Multiple Vitamin (MULTI-VITAMINS) TABS Take 1 tablet by mouth daily.     Yes [provider]  omeprazole (PRILOSEC) 40 MG capsule Take 40 mg by mouth daily.   Yes [provider]  Oxycodone HCl 10 MG TABS Take 10 mg by mouth every 4 (four) hours as needed (pain).   Yes [provider]  polyethylene glycol (MIRALAX / GLYCOLAX) 17 g packet Take 17 g by mouth daily as needed for mild constipation. 08/12/20  Yes Wieting, Richard, MD  potassium chloride (KLOR-CON) 10 MEQ tablet TAKE 1 TABLET BY MOUTH TWICE DAILY 06/12/21  Yes Gollan, Kathlene November, MD  promethazine (PHENERGAN) 25 MG tablet Take 25 mg by mouth every  8 (eight) hours as needed. 04/02/21  Yes [provider]  spironolactone (ALDACTONE) 25 MG tablet Take 0.5 tablets (12.5 mg total) by mouth daily. 10/09/20  Yes Minna Merritts, MD  tamsulosin (FLOMAX) 0.4 MG CAPS capsule Take 1 capsule (0.4 mg total) by mouth daily after supper. 07/01/20  Yes Shawna Clamp, MD  traZODone (DESYREL) 150 MG tablet Take 150 mg by mouth at bedtime. 07/12/19  Yes [provider]  VERQUVO 10 MG TABS TAKE 1 TABLET BY MOUTH ONCE DAILY 11/16/20  Yes Alisa Graff, FNP  Fluticasone-Umeclidin-Vilant 100-62.5-25 MCG/INH AEPB Inhale 1 puff into the lungs daily.    [provider]  nitroGLYCERIN (NITROSTAT) 0.4 MG SL tablet Place 1 tablet (0.4 mg total) under the tongue every 5 (five) minutes as needed for chest pain. 10/09/20   Minna Merritts, MD  torsemide 40 MG TABS Take 40 mg by mouth 2 (two) times daily. Take 40 mg by mouth twice daily. 03/30/21 07/11/21  Sidney Ace, MD     Critical care time: 67 minutes     Darel Hong, AGACNP-BC [facility] Prefer epic messenger for cross cover needs If after hours, please call E-link

## 2021-07-17 NOTE — Progress Notes (Signed)
[facility]  Date: 07/17/21  Time: 11:02 AM  Upon arrival in the cath prep area, EKG was performed due to some abnormalities noted on telemetry by nursing.  EKG shows atrial fibrillation with right axis deviation, right bundle branch block, and inferior and anterolateral ST elevations.  Though some of the inferior ST elevation was evident on prior tracings, it is more pronounced today.  [name] [name] complained of chest pain to the nursing staff though to me he describes only shortness of breath.  He was given sublingual nitroglycerin with resultant hypotension and systolic pressures in the 80s.  He currently denies chest pain but reports feeling very tired and short of breath.  He is still hypotensive.  I have reviewed [name] [name] prior cardiac catheterization that showed severe three-vessel coronary disease.  Given his chest pain earlier and EKG changes, we discussed utility of coronary angiography and possible PCI in addition to right heart catheterization, given our concern for acute on chronic HFrEF and cardiorenal syndrome.  I advised [name] [name] that coronary angiography would carry with it and elevated risk of contrast-induced nephropathy and bleeding.  He is agreeable to moving forward with right and left heart catheterization with possible PCI.  We will attempt to minimize contrast.  Shared Decision Making/Informed Consent The risks [stroke (1 in 1000), death (1 in 91), kidney failure [usually temporary] (1 in 500), bleeding (1 in 200), allergic reaction [possibly serious] (1 in 200)], benefits (diagnostic support and management of coronary artery disease) and alternatives of a cardiac catheterization were discussed in detail with [name] [name] and he is willing to proceed.  Nelva Bush, MD [facility]

## 2021-07-17 NOTE — Progress Notes (Signed)
PICC order received. Spoke with Primary RN, patient is not back in unit/room yet. Will follow up.

## 2021-07-17 NOTE — Progress Notes (Signed)
PROGRESS NOTE    [name] [name]  ZJQ:[id number] DOB: 1960/07/27 DOA: 07/15/2021 PCP: [name] Jacobs, MD  [id number]   Assessment & Plan:   Principal Problem:   Acute on chronic HFrEF (heart failure with reduced ejection fraction) ([place]) Active Problems:   PAD (peripheral artery disease) (HCC)   Tobacco use   Type 2 diabetes mellitus with hyperlipidemia (HCC)   AKI (acute kidney injury) ([place])   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HCC)   CAD (coronary artery disease)   Hyponatremia   COPD with acute exacerbation (HCC)   Chronic anticoagulation   Hypotension   Permanent atrial fibrillation (HCC)   Prolonged QT interval   [name] [name] is a 61 y.o. male with medical history significant for HFrEF, CAD, a-fib, COPD, HTN, DMT2, OSA, tobacco use who presented for evaluation of shortness of breath and swelling.    He was seen 3 days ago at the CHF clinic and at that time he looked to be fluid overloaded and had soft blood pressure and was referred to the emergency room for further evaluation and admission but he refused at that time.  Symptoms have worsened in the last few days.    Continues to smoke and has increased to 2 packs a day from 1 pack a day in the last 3 to 4 weeks.     Acute on chronic HFrEF (heart failure with reduced ejection fraction --LVEF<20%.  Patient does not have an AICD. --BNP chronically elevated.  CXR showed Mild pulmonary vascular congestion. No evidence of pulmonary Edema. --Cr worsened with just 1 dose of IV lasix 40 Plan: --right and left heart cath today, found to have severely elevated left and right heart filling pressures and severely reduced cardiac output.   --transfer to ICU for initiation of milrinone and pressor for low BP.  PCCM consulted. --IV lasix 80 mg BID started today per cardiology  Acute COPD exacerbation --dyspnea contributed by COPD exacerbation, since pt has significant wheezing, cough, and smokes 2 packs per day.  Does  not appear to have PNA. --received IV solumedrol 125 mg on admission Plan: --cont steroid as prednisone 40 mg daily --DuoNeb scheduled    AKI (acute kidney injury) 2/2 Cardiorenal syndrome --worsened with just 1 dose of IV lasix 40.  Likely due to cardio-renal syndrome --diuretic per cardiology     Type 2 diabetes mellitus with hyperlipidemia  Hyperglycemia exacerbated by steroid use Last HgbA1c was 6.1 on 06/17/21 --resume home metformin after discharge --SSI for now      Hypotension Pt with soft BP in ER and hx of cardiogenic shock in past. Was given dose of midodrine in ER.  --transfer to ICU for pressor, per cardio rec     Permanent atrial fibrillation  --hold digoxin due to AKI --cont Eliquis     Hyponatremia --Na 123 on presentation, worsening, likely due to hypervolemia --diuresis per cardio     PAD (peripheral artery disease) Chronic. --cont statin and Zetia --cont plavix     Tobacco use Smokes 2 ppd.  --cont nicotine patch      Prolonged QT interval Avoid medications which could further prolong QT interval  LLE wounds --podiatry consulted and did bed-side I/D --wound care per order   DVT prophylaxis: [id number] Code Status: Full code  Family Communication: significant other updated at bedside today  Level of care: ICU Dispo:   The patient is from: home Anticipated d/c is to: home Anticipated d/c date is: > 3 days Patient currently is  not medically ready to d/c due to: significant dyspnea, in ICU on milrinone and pressor gtt   Subjective and Interval History:  Pt was taken for right and left heart cath today and found to have severely elevated left and right heart filling pressures and severely reduced cardiac output.  Cardiology rec transferring to ICU for initiation of milrinone and pressor for low BP.   Objective: Vitals:   07/17/21 1530 07/17/21 1545 07/17/21 1600 07/17/21 1615  BP: (!) 89/66 95/72    Pulse: 76 85 79 86  Resp: 14 15 14 16    Temp:      TempSrc:      SpO2: 100% 98% 95% 94%  Weight:      Height:        Intake/Output Summary (Last 24 hours) at 07/17/2021 1742 Last data filed at 07/17/2021 1607 Gross per 24 hour  Intake 880.75 ml  Output 1500 ml  Net -619.25 ml   Filed Weights   07/16/21 0500 07/17/21 0533 07/17/21 1023  Weight: 94.9 kg 94.4 kg 94.4 kg    Examination:   Constitutional: NAD, AAOx3 HEENT: conjunctivae and lids normal, EOMI CV: No cyanosis.   RESP: normal respiratory effort at rest, on RA Extremities: right BKA, left foot in ACE wrap. SKIN: warm, dry Neuro: II - XII grossly intact.   Psych: Normal mood and affect.     Data Reviewed: I have personally reviewed following labs and imaging studies  CBC: Recent Labs  Lab 07/15/21 1640 07/16/21 0432 07/17/21 0603  WBC 5.6 3.9* 8.2  HGB 9.0* 9.1* 9.1*  HCT 28.6* 28.2* 27.8*  MCV 80.3 77.7* 78.8*  PLT 169 149* 147   Basic Metabolic Panel: Recent Labs  Lab 07/15/21 1640 07/16/21 0432 07/17/21 0603  NA 123* 126* 121*  K 5.1 5.2* 5.5*  CL 90* 92* 87*  CO2 26 26 24   GLUCOSE 98 127* 162*  BUN 39* 45* 62*  CREATININE 1.55* 1.71* 2.02*  CALCIUM 8.1* 8.6* 8.5*  MG  --  2.3 2.4   GFR: Estimated Creatinine Clearance: 43.6 mL/min (A) (by C-G formula based on SCr of 2.02 mg/dL (H)). Liver Function Tests: Recent Labs  Lab 07/15/21 1640 07/16/21 0432  AST 32 25  ALT 14 14  ALKPHOS 108 108  BILITOT 2.9* 2.3*  PROT 6.5 6.1*  ALBUMIN 3.2* 3.3*   No results for input(s): LIPASE, AMYLASE in the last 168 hours. No results for input(s): AMMONIA in the last 168 hours. Coagulation Profile: Recent Labs  Lab 07/15/21 1640  INR 1.4*   Cardiac Enzymes: No results for input(s): CKTOTAL, CKMB, CKMBINDEX, TROPONINI in the last 168 hours. BNP (last 3 results) No results for input(s): PROBNP in the last 8760 hours. HbA1C: No results for input(s): HGBA1C in the last 72 hours. CBG: Recent Labs  Lab 07/17/21 1030 07/17/21 1408  07/17/21 1722  GLUCAP 191* 181* 233*   Lipid Profile: No results for input(s): CHOL, HDL, LDLCALC, TRIG, CHOLHDL, LDLDIRECT in the last 72 hours. Thyroid Function Tests: Recent Labs    07/16/21 0432  TSH 1.909   Anemia Panel: No results for input(s): VITAMINB12, FOLATE, FERRITIN, TIBC, IRON, RETICCTPCT in the last 72 hours. Sepsis Labs: Recent Labs  Lab 07/15/21 1640 07/16/21 0432  PROCALCITON <0.10 0.11    Recent Results (from the past 240 hour(s))  Resp Panel by RT-PCR (Flu A&B, Covid) Nasopharyngeal Swab     Status: None   Collection Time: 07/15/21  4:40 PM   Specimen: Nasopharyngeal Swab; Nasopharyngeal(NP)  swabs in vial transport medium  Result Value Ref Range Status   SARS Coronavirus 2 by RT PCR NEGATIVE NEGATIVE Final    Comment: (NOTE) SARS-CoV-2 target nucleic acids are NOT DETECTED.  The SARS-CoV-2 RNA is generally detectable in upper respiratory specimens during the acute phase of infection. The lowest concentration of SARS-CoV-2 viral copies this assay can detect is 138 copies/mL. A negative result does not preclude SARS-Cov-2 infection and should not be used as the sole basis for treatment or other patient management decisions. A negative result may occur with  improper specimen collection/handling, submission of specimen other than nasopharyngeal swab, presence of viral mutation(s) within the areas targeted by this assay, and inadequate number of viral copies(<138 copies/mL). A negative result must be combined with clinical observations, patient history, and epidemiological information. The expected result is Negative.  Fact Sheet for Patients:  EntrepreneurPulse.com.au  Fact Sheet for Healthcare Providers:  IncredibleEmployment.be  This test is no t yet approved or cleared by the [place] FDA and  has been authorized for detection and/or diagnosis of SARS-CoV-2 by FDA under an Emergency Use Authorization  (EUA). This EUA will remain  in effect (meaning this test can be used) for the duration of the COVID-19 declaration under Section 564(b)(1) of the Act, 21 U.S.C.section 360bbb-3(b)(1), unless the authorization is terminated  or revoked sooner.       Influenza A by PCR NEGATIVE NEGATIVE Final   Influenza B by PCR NEGATIVE NEGATIVE Final    Comment: (NOTE) The Xpert Xpress SARS-CoV-2/FLU/RSV plus assay is intended as an aid in the diagnosis of influenza from Nasopharyngeal swab specimens and should not be used as a sole basis for treatment. Nasal washings and aspirates are unacceptable for Xpert Xpress SARS-CoV-2/FLU/RSV testing.  Fact Sheet for Patients: EntrepreneurPulse.com.au  Fact Sheet for Healthcare Providers: IncredibleEmployment.be  This test is not yet approved or cleared by the [place] FDA and has been authorized for detection and/or diagnosis of SARS-CoV-2 by FDA under an Emergency Use Authorization (EUA). This EUA will remain in effect (meaning this test can be used) for the duration of the COVID-19 declaration under Section 564(b)(1) of the Act, 21 U.S.C. section 360bbb-3(b)(1), unless the authorization is terminated or revoked.  Performed at [facility], [address] [place], [place] [address]   MRSA Next Gen by PCR, Nasal     Status: None   Collection Time: 07/17/21  3:22 PM   Specimen: Nasal Mucosa; Nasal Swab  Result Value Ref Range Status   MRSA by PCR Next Gen NOT DETECTED NOT DETECTED Final    Comment: (NOTE) The GeneXpert MRSA Assay (FDA approved for NASAL specimens only), is one component of a comprehensive MRSA colonization surveillance program. It is not intended to diagnose MRSA infection nor to guide or monitor treatment for MRSA infections. Test performance is not FDA approved in patients less than 79 years old. Performed at [facility], [address], [place], [place]  [address]       Radiology Studies: CARDIAC CATHETERIZATION  Result Date: 07/17/2021 Conclusions: Severe multivessel coronary artery disease, similar to prior catheterization from 01/2020. Severely elevated left heart, right heart, and pulmonary artery pressures. Moderately-severely reduced Fick cardiac output/index. Recommendations: Medical management of stable severe coronary artery disease. Transfer to ICU for initiation of milrinone.  PICC line to be placed by IV team for infusion of vasoactive medications as well as hemodynamic monitoring. Discontinue vericiguat due to soft BP and continue to hold carvedilol, losartan, and spironolactone.  Addition of norepinephrine may need to be considered if hypotension worsens. Restart furosemide at 80 mg IV BID (first dose given during catheterization).  Further dose escalation may be necessary based on response to furosemide and milrinone. Restart apixaban 5 mg BID tonight if no evidence of bleeding/vascular injury at catheterization sites. Nelva Bush, MD [facility]  [place] EKG SITE RITE  Result Date: 07/17/2021 If [facility] image not attached, placement could not be confirmed due to current cardiac rhythm.    Scheduled Meds:  apixaban  5 mg Oral BID   atorvastatin  80 mg Oral Daily   Chlorhexidine Gluconate Cloth  6 each Topical Daily   clopidogrel  75 mg Oral Daily   DULoxetine  60 mg Oral Daily   ezetimibe  10 mg Oral Daily   fluticasone furoate-vilanterol  1 puff Inhalation Daily   And   umeclidinium bromide  1 puff Inhalation Daily   furosemide  80 mg Intravenous BID   gabapentin  800 mg Oral QID   insulin aspart  0-15 Units Subcutaneous TID WC   insulin aspart  0-5 Units Subcutaneous QHS   ipratropium-albuterol  3 mL Nebulization QID   multivitamin with minerals  1 tablet Oral Daily   nicotine  21 mg Transdermal Daily   pantoprazole  40 mg Oral Daily   predniSONE  40 mg Oral Q breakfast   sodium chloride flush  3 mL Intravenous Q12H    sodium chloride flush  3 mL Intravenous Q12H   tamsulosin  0.4 mg Oral QPC supper   Continuous Infusions:  sodium chloride     sodium chloride     sodium chloride 10 mL/hr at 07/17/21 1607   milrinone 0.25 mcg/kg/min (07/17/21 1607)   norepinephrine (LEVOPHED) Adult infusion 8 mcg/min (07/17/21 1607)     LOS: 2 days     Enzo Bi, MD [facility] If 7PM-7AM, please contact night-coverage 07/17/2021, 5:42 PM

## 2021-07-17 NOTE — Consult Note (Signed)
[facility] Vascular Consult Note  MRN : [id number]  [name] [name] is a 61 y.o. (1960-07-21) male who presents with chief complaint of  Chief Complaint  Patient presents with   Leg Swelling  .  History of Present Illness: [name] [name] is a 61 year old male who is well-known to our vascular practice.  We are asked to consult by Dr. Luana Shu due to the presence of multiple wounds on his left lower extremity and ankle area.  The patient has a significant vascular history for a right below-knee amputation.  The patient has had a multitude of interventions to his left lower extremity.  His medical history is also significant for HFrEF, diabetes mellitus, hypertension, hyperlipidemia, and COPD.  The patient was initially admitted to the hospital due to increased shortness of breath and nonproductive cough in the setting of his heart failure.  Vascular surgery was consulted for possible intervention in setting of multiple left lower extremity wounds.  Current Facility-Administered Medications  Medication Dose Route Frequency Provider Last Rate Last Admin   0.9 %  sodium chloride infusion  250 mL Intravenous PRN Chotiner, Yevonne Aline, MD       acetaminophen (TYLENOL) tablet 650 mg  650 mg Oral Q6H PRN Chotiner, Yevonne Aline, MD       Or   acetaminophen (TYLENOL) suppository 650 mg  650 mg Rectal Q6H PRN Chotiner, Yevonne Aline, MD       albuterol (PROVENTIL) (2.5 MG/3ML) 0.083% nebulizer solution 2.5 mg  2.5 mg Nebulization Q4H PRN Chotiner, Yevonne Aline, MD       apixaban Arne [place]) tablet 5 mg  5 mg Oral BID Chotiner, Yevonne Aline, MD   5 mg at 07/16/21 2336   atorvastatin (LIPITOR) tablet 80 mg  80 mg Oral Daily Chotiner, Yevonne Aline, MD   80 mg at 07/16/21 5176   clopidogrel (PLAVIX) tablet 75 mg  75 mg Oral Daily Chotiner, Yevonne Aline, MD   75 mg at 07/16/21 0911   digoxin (LANOXIN) tablet 125 mcg  125 mcg Oral Daily Chotiner, Yevonne Aline, MD   125 mcg at 07/16/21 0911   DULoxetine (CYMBALTA) DR  capsule 60 mg  60 mg Oral Daily Chotiner, Yevonne Aline, MD   60 mg at 07/16/21 0910   ezetimibe (ZETIA) tablet 10 mg  10 mg Oral Daily Chotiner, Yevonne Aline, MD   10 mg at 07/16/21 0910   fluticasone furoate-vilanterol (BREO ELLIPTA) 100-25 MCG/ACT 1 puff  1 puff Inhalation Daily Chotiner, Yevonne Aline, MD   1 puff at 07/16/21 0910   And   umeclidinium bromide (INCRUSE ELLIPTA) 62.5 MCG/ACT 1 puff  1 puff Inhalation Daily Chotiner, Yevonne Aline, MD   1 puff at 07/16/21 0909   gabapentin (NEURONTIN) capsule 800 mg  800 mg Oral QID Chotiner, Yevonne Aline, MD   800 mg at 07/16/21 2337   ipratropium-albuterol (DUONEB) 0.5-2.5 (3) MG/3ML nebulizer solution 3 mL  3 mL Inhalation Q6H PRN Chotiner, Yevonne Aline, MD   3 mL at 07/16/21 0411   ipratropium-albuterol (DUONEB) 0.5-2.5 (3) MG/3ML nebulizer solution 6 mL  6 mL Nebulization QID Enzo Bi, MD   6 mL at 07/16/21 2026   multivitamin with minerals tablet 1 tablet  1 tablet Oral Daily Chotiner, Yevonne Aline, MD   1 tablet at 07/16/21 0911   nicotine (NICODERM CQ - dosed in mg/24 hours) patch 21 mg  21 mg Transdermal Daily Chotiner, Yevonne Aline, MD   21 mg at 07/16/21 0911   nitroGLYCERIN (NITROSTAT)  SL tablet 0.4 mg  0.4 mg Sublingual Q5 min PRN Chotiner, Yevonne Aline, MD       oxyCODONE (Oxy IR/ROXICODONE) immediate release tablet 10 mg  10 mg Oral Q6H PRN Oswald Hillock, RPH   10 mg at 07/16/21 2351   pantoprazole (PROTONIX) EC tablet 40 mg  40 mg Oral Daily Chotiner, Yevonne Aline, MD   40 mg at 07/16/21 0911   predniSONE (DELTASONE) tablet 40 mg  40 mg Oral Q breakfast Enzo Bi, MD   40 mg at 07/16/21 1723   prochlorperazine (COMPAZINE) injection 10 mg  10 mg Intravenous Q6H PRN Chotiner, Yevonne Aline, MD       senna-docusate (Senokot-S) tablet 1 tablet  1 tablet Oral QHS PRN Chotiner, Yevonne Aline, MD       sodium chloride flush (NS) 0.9 % injection 3 mL  3 mL Intravenous Q12H Chotiner, Yevonne Aline, MD   3 mL at 07/16/21 2337   sodium chloride flush (NS) 0.9 % injection 3 mL  3 mL  Intravenous PRN Chotiner, Yevonne Aline, MD       tamsulosin [facility]) capsule 0.4 mg  0.4 mg Oral QPC supper Chotiner, Yevonne Aline, MD   0.4 mg at 07/16/21 1723   Vericiguat TABS 1 tablet  1 tablet Oral Daily Chotiner, Yevonne Aline, MD        Past Medical History:  Diagnosis Date   Acute on chronic heart failure ([place])    AKI (acute kidney injury) ([place]) 01/03/2018   Angina at rest [employer]) 08/13/2017   Arthritis    Asthma    Atherosclerosis    BPH (benign prostatic hyperplasia)    Carotid arterial disease ([place])    Cellulitis of foot, left 09/27/2019   Charcot's joint of foot, right    Chronic combined systolic (congestive) and diastolic (congestive) heart failure ([place])    a. 02/2013 EF 50% by LV gram; b. 04/2017 Echo: EF 25-30%. diff HK. Gr2 DD; c. 06/2020 Echo: EF <20%, glob HK. RVS 53.58mmHg. Sev BAE. Mild to mod TR.   Chronic foot pain (Secondary Area of Pain) (Right) 01/29/2018   COPD (chronic obstructive pulmonary disease) (HCC)    COPD with acute exacerbation ([place]) 01/30/2020   Coronary artery disease    a. 2013 S/P PCI of LAD [facility]);  b. 03/2013 PCI: RCA 90p ( 2.5 x 23 mm DES); c. 02/2014 Cath: patent RCA stent->Med Rx; d. 04/2017 Cath: LM nl, LAd 30ost/p, 5m/d, D1 80ost, LCX 90p/m, OM1 85, RCA 70/20p, 37m, 70d->Referred for CT Surg-felt to be poor candidate; d. 01/2020 Cath: LM nl, LAD 20p/m, 60d. D1 80. LCX 90p/m. LPAV 95, RCA 80/20p, 80p/m.   Diabetic neuropathy ([place])    Diabetic ulcer of right foot ([place])    Hernia    HFrEF (heart failure with reduced ejection fraction) ([place]) 01/30/2020   Hyperlipidemia    Hypertension    Ischemic cardiomyopathy    a. 02/2013 EF 50% by LV gram; b. 04/2017 Echo: EF 25-30%. diff HK. Gr2 DD; c. 06/2020 Echo: EF <20%, glob HK. RVS 53.67mmHg. Sev BAE. Mild to mod TR.   Morbid obesity ([place])    Neuropathy    PAD (peripheral artery disease) ([place])    a. Followed by Dr. Lucky Cowboy; b. 08/2010 Periph Angio: RSFA 70-80p (6X60 self-expanding stent); c. 09/2010 Periph Angio: L SFA  100p (7x unknown length self-expanding stent); d. 06/2015 Periph Angio: R SFA short segment occlusion (6x12 self-expanding stent).   Persistent atrial fibrillation (HCC)    Restless leg  syndrome    Sepsis ([place]) 03/30/2018   Sleep apnea    Subclavian artery stenosis, right (HCC)    Syncope and collapse    Tobacco use    a. 75+ yr hx - still smoking 1ppd, down from 2 ppd.   Type II diabetes mellitus (HCC)    Unstable angina ([place]) 07/11/2016   Varicose vein     Past Surgical History:  Procedure Laterality Date   ABDOMINAL AORTAGRAM N/A 06/23/2013   Procedure: ABDOMINAL Maxcine Ham;  Surgeon: Wellington Hampshire, MD;  Location: [facility] CATH [facility];  Service: Cardiovascular;  Laterality: N/A;   AMPUTATION Right 04/01/2018   Procedure: AMPUTATION BELOW KNEE;  Surgeon: Algernon Huxley, MD;  Location: [facility] ORS;  Service: Vascular;  Laterality: Right;   AMPUTATION TOE Left 08/11/2020   Procedure: AMPUTATION TOE-2nd Toe;  Surgeon: Samara Deist, DPM;  Location: [facility] ORS;  Service: Podiatry;  Laterality: Left;   APPENDECTOMY     CARDIAC CATHETERIZATION  10/14   [place]; X1 STENT PROXIMAL RCA   CARDIAC CATHETERIZATION  02/2010   [facility]   CARDIAC [facility]IZATION  02/21/2014   [facility]   CLAVICLE SURGERY     HERNIA REPAIR     IRRIGATION AND DEBRIDEMENT FOOT Right 01/04/2018   Procedure: IRRIGATION AND DEBRIDEMENT FOOT;  Surgeon: Samara Deist, DPM;  Location: [facility] ORS;  Service: Podiatry;  Laterality: Right;   LEFT HEART CATH AND CORONARY ANGIOGRAPHY N/A 01/20/2020   Procedure: LEFT HEART CATH AND CORONARY ANGIOGRAPHY;  Surgeon: Wellington Hampshire, MD;  Location: [facility];  Service: Cardiovascular;  Laterality: N/A;   LEFT HEART CATH AND CORS/GRAFTS ANGIOGRAPHY N/A 04/28/2017   Procedure: LEFT HEART CATH AND CORONARY ANGIOGRAPHY;  Surgeon: Wellington Hampshire, MD;  Location: [place] [facility];  Service: Cardiovascular;  Laterality: N/A;   LOWER EXTREMITY ANGIOGRAPHY Right 03/03/2018   Procedure: LOWER EXTREMITY  ANGIOGRAPHY;  Surgeon: Katha Cabal, MD;  Location: [facility];  Service: Cardiovascular;  Laterality: Right;   LOWER EXTREMITY ANGIOGRAPHY Left 08/24/2018   Procedure: LOWER EXTREMITY ANGIOGRAPHY;  Surgeon: Algernon Huxley, MD;  Location: [place] [facility];  Service: Cardiovascular;  Laterality: Left;   LOWER EXTREMITY ANGIOGRAPHY Left 06/14/2019   Procedure: LOWER EXTREMITY ANGIOGRAPHY;  Surgeon: Algernon Huxley, MD;  Location: [place] [facility];  Service: Cardiovascular;  Laterality: Left;   LOWER EXTREMITY ANGIOGRAPHY Left 08/02/2019   Procedure: LOWER EXTREMITY ANGIOGRAPHY;  Surgeon: Algernon Huxley, MD;  Location: [place] CV [facility];  Service: Cardiovascular;  Laterality: Left;   LOWER EXTREMITY ANGIOGRAPHY Left 07/17/2020   Procedure: LOWER EXTREMITY ANGIOGRAPHY;  Surgeon: Algernon Huxley, MD;  Location: [place] [facility];  Service: Cardiovascular;  Laterality: Left;   PERIPHERAL ARTERIAL STENT GRAFT     x2 left/right   PERIPHERAL VASCULAR BALLOON ANGIOPLASTY Right 01/06/2018   Procedure: PERIPHERAL VASCULAR BALLOON ANGIOPLASTY;  Surgeon: Katha Cabal, MD;  Location: [place] [facility];  Service: Cardiovascular;  Laterality: Right;   PERIPHERAL VASCULAR CATHETERIZATION N/A 07/06/2015   Procedure: Abdominal Aortogram w/Lower Extremity;  Surgeon: Algernon Huxley, MD;  Location: [place];  Service: Cardiovascular;  Laterality: N/A;   PERIPHERAL VASCULAR CATHETERIZATION  07/06/2015   Procedure: Lower Extremity Intervention;  Surgeon: Algernon Huxley, MD;  Location: [place] [facility];  Service: Cardiovascular;;    Social History Social History   Tobacco Use   Smoking status: Every Day    Packs/day: 1.00    Years: 41.00    Pack  years: 41.00    Types: Cigarettes   Smokeless tobacco: Never  Vaping Use   Vaping Use: Never used  Substance Use Topics   Alcohol use: No   Drug use: No    Family History Family History  Problem Relation Age of Onset   Heart attack  Father    Heart disease Father    Hypertension Father    Hyperlipidemia Father    Hypertension Mother    Hyperlipidemia Mother     Allergies  Allergen Reactions   Dulaglutide Anaphylaxis, Diarrhea and Hives   Ace Inhibitors     hypotension   Other Itching    Skin itching associated with nitro patch   Prednisone Rash     REVIEW OF SYSTEMS (Negative unless checked)  Constitutional: [] Weight loss  [] Fever  [] Chills Cardiac: [] Chest pain   [] Chest pressure   [] Palpitations   [] Shortness of breath when laying flat   [] Shortness of breath at rest   [] Shortness of breath with exertion. Vascular:  [x] Pain in legs with walking   [] Pain in legs at rest   [] Pain in legs when laying flat   [] Claudication   [] Pain in feet when walking  [] Pain in feet at rest  [] Pain in feet when laying flat   [] History of DVT   [] Phlebitis   [x] Swelling in legs   [] Varicose veins   [x] Non-healing ulcers Pulmonary:   [] Uses home oxygen   [] Productive cough   [] Hemoptysis   [] Wheeze  [] COPD   [] Asthma Neurologic:  [] Dizziness  [] Blackouts   [] Seizures   [] History of stroke   [] History of TIA  [] Aphasia   [] Temporary blindness   [] Dysphagia   [] Weakness or numbness in arms   [] Weakness or numbness in legs Musculoskeletal:  [] Arthritis   [] Joint swelling   [] Joint pain   [] Low back pain Hematologic:  [] Easy bruising  [] Easy bleeding   [] Hypercoagulable state   [] Anemic  [] Hepatitis Gastrointestinal:  [] Blood in stool   [] Vomiting blood  [] Gastroesophageal reflux/heartburn   [] Difficulty swallowing. Genitourinary:  [] Chronic kidney disease   [] Difficult urination  [] Frequent urination  [] Burning with urination   [] Blood in urine Skin:  [] Rashes   [] Ulcers   [] Wounds Psychological:  [] History of anxiety   []  History of major depression.  Physical Examination  Vitals:   07/16/21 1632 07/16/21 2000 07/16/21 2026 07/16/21 2339  BP: (!) 150/67 119/79  102/62  Pulse: 70 (!) 55  61  Resp: 20 17  18   Temp: 98.6 F (37  C) 97.9 F (36.6 C)    TempSrc: Oral Oral    SpO2: 92% 99% 97% 97%  Weight:      Height:       Body mass index is 32.77 kg/m. Gen:  WD/WN, NAD Head: [place]/AT, No temporalis wasting. Prominent temp pulse not noted. Vascular: 2+ edema left lower extremity, multiple wounds left foot and ankle, right below-knee amputation Vessel Right Left  PT N/A Not Palpable  DP N/A Not Palpable   Neurologic: Light touch sensation absent on the left lower extremity Psychiatric: Judgment intact, Mood & affect appropriate for pt's clinical situation.      CBC Lab Results  Component Value Date   WBC 3.9 (L) 07/16/2021   HGB 9.1 (L) 07/16/2021   HCT 28.2 (L) 07/16/2021   MCV 77.7 (L) 07/16/2021   PLT 149 (L) 07/16/2021    BMET    Component Value Date/Time   NA 126 (L) 07/16/2021 0432   NA 129 (L) 04/17/2021 1148  NA 136 06/08/2014 1037   K 5.2 (H) 07/16/2021 0432   K 4.3 06/08/2014 1037   CL 92 (L) 07/16/2021 0432   CL 101 06/08/2014 1037   CO2 26 07/16/2021 0432   CO2 27 06/08/2014 1037   GLUCOSE 127 (H) 07/16/2021 0432   GLUCOSE 415 (H) 06/08/2014 1037   BUN 45 (H) 07/16/2021 0432   BUN 31 (H) 04/17/2021 1148   BUN 9 06/08/2014 1037   CREATININE 1.71 (H) 07/16/2021 0432   CREATININE 0.87 02/05/2018 1417   CALCIUM 8.6 (L) 07/16/2021 0432   CALCIUM 9.2 06/08/2014 1037   GFRNONAA 46 (L) 07/16/2021 0432   GFRNONAA >60 06/08/2014 1037   GFRNONAA >60 02/17/2014 0909   GFRAA 52 (L) 07/07/2020 1211   GFRAA >60 06/08/2014 1037   GFRAA >60 02/17/2014 0909   Estimated Creatinine Clearance: 51.7 mL/min (A) (by C-G formula based on SCr of 1.71 mg/dL (H)).  COAG Lab Results  Component Value Date   INR 1.4 (H) 07/15/2021   INR 1.3 (H) 06/14/2021   INR 1.2 06/14/2021    Radiology DG Chest Portable 1 View  Result Date: 07/15/2021 CLINICAL DATA:  Shortness of breath, volume overload or COPD. EXAM: PORTABLE CHEST 1 VIEW COMPARISON:  Chest radiograph dated in June 14, 2021. FINDINGS:  Heart is enlarged. Atherosclerotic calcification of the aortic arch. Pulmonary vascular congestion without evidence of pulmonary edema or pleural effusion. No focal consolidation. IMPRESSION: 1. Stable cardiomegaly with atherosclerotic calcification of the aortic arch. 2. Mild pulmonary vascular congestion. No evidence of pulmonary edema or infiltrate. Electronically Signed   By: Keane Police D.O.   On: 07/15/2021 16:38      Assessment/Plan 1.  Peripheral arterial disease with ulceration I discussed with the patient and his girlfriend, the angiogram procedure to attempt intervention to improve the perfusion to the left lower extremity.  I discussed with the patient that even with revascularization, his significantly reduced ejection fraction may hamper his healing abilities.  He will continue to follow with podiatry for wound care.  We discussed the risk, benefits and alternatives of the angiogram procedure the patient agrees to proceed.  We will plan for intervention on Wednesday.  Case discussed with Dr. Ephriam Jenkins, NP  07/17/2021 12:22 AM    This note was created with Dragon medical transcription system.  Any error is purely unintentional

## 2021-07-17 NOTE — Progress Notes (Signed)
Contacted MD End about patient's blood pressure and milnirone drip. MD reported that levophed would be ordered and to starte milnirone one Levophed has begun and pressures are maintaining per parameters. ICU RN informed.

## 2021-07-17 NOTE — Plan of Care (Signed)
°  Problem: Education: Goal: Ability to demonstrate management of disease process will improve Outcome: Not Progressing Goal: Ability to verbalize understanding of medication therapies will improve Outcome: Not Progressing Goal: Individualized Educational Video(s) Outcome: Not Progressing   Problem: Activity: Goal: Capacity to carry out activities will improve Outcome: Not Progressing   Problem: Cardiac: Goal: Ability to achieve and maintain adequate cardiopulmonary perfusion will improve Outcome: Not Progressing   Problem: Education: Goal: Knowledge of General Education information will improve Description: Including pain rating scale, medication(s)/side effects and non-pharmacologic comfort measures Outcome: Not Progressing   Problem: Health Behavior/Discharge Planning: Goal: Ability to manage health-related needs will improve Outcome: Not Progressing   Problem: Clinical Measurements: Goal: Ability to maintain clinical measurements within normal limits will improve Outcome: Not Progressing Goal: Will remain free from infection Outcome: Not Progressing Goal: Diagnostic test results will improve Outcome: Not Progressing Goal: Respiratory complications will improve Outcome: Not Progressing Goal: Cardiovascular complication will be avoided Outcome: Not Progressing

## 2021-07-17 NOTE — H&P (View-Only) (Signed)
Progress Note  Patient Name: [name] [name] Date of Encounter: 07/17/2021  [facility] Cardiologist: Ida Rogue, MD   Subjective   Kidney function worse this AM, sodium is also lower. Patient feels the same as yesterday, says coughing is worse. Feels abdomen is full.  Inpatient Medications    Scheduled Meds:  apixaban  5 mg Oral BID   atorvastatin  80 mg Oral Daily   clopidogrel  75 mg Oral Daily   digoxin  125 mcg Oral Daily   DULoxetine  60 mg Oral Daily   ezetimibe  10 mg Oral Daily   fluticasone furoate-vilanterol  1 puff Inhalation Daily   And   umeclidinium bromide  1 puff Inhalation Daily   gabapentin  800 mg Oral QID   ipratropium-albuterol  6 mL Nebulization QID   multivitamin with minerals  1 tablet Oral Daily   nicotine  21 mg Transdermal Daily   pantoprazole  40 mg Oral Daily   predniSONE  40 mg Oral Q breakfast   sodium chloride flush  3 mL Intravenous Q12H   tamsulosin  0.4 mg Oral QPC supper   Vericiguat  1 tablet Oral Daily   Continuous Infusions:  sodium chloride     PRN Meds: sodium chloride, acetaminophen **OR** acetaminophen, albuterol, ipratropium-albuterol, nitroGLYCERIN, oxyCODONE, prochlorperazine, senna-docusate, sodium chloride flush   Vital Signs    Vitals:   07/16/21 2339 07/17/21 0533 07/17/21 0708 07/17/21 0737  BP: 102/62 106/67  122/84  Pulse: 61 77  73  Resp: 18 19    Temp: 97.9 F (36.6 C) 98 F (36.7 C)    TempSrc:      SpO2: 97% 92% 92% 94%  Weight:  94.4 kg    Height:        Intake/Output Summary (Last 24 hours) at 07/17/2021 0820 Last data filed at 07/17/2021 0500 Gross per 24 hour  Intake 1800 ml  Output 900 ml  Net 900 ml   Last 3 Weights 07/17/2021 07/16/2021 07/15/2021  Weight (lbs) 208 lb 1.8 oz 209 lb 3.5 oz 208 lb 14.4 oz  Weight (kg) 94.4 kg 94.9 kg 94.756 kg  Some encounter information is confidential and restricted. Go to Review Flowsheets activity to see all data.      Telemetry    Afib, RBBB, HR  60-70 - Personally Reviewed  ECG    No new - Personally Reviewed  Physical Exam   GEN: No acute distress.   Neck: No JVD Cardiac: Irreg Irreg, no murmurs, rubs, or gallops.  Respiratory: Clear to auscultation bilaterally. GI:  +tender, +distended  MS: trace edema; No deformity. Neuro:  Nonfocal  Psych: Normal affect   Labs    High Sensitivity Troponin:   Recent Labs  Lab 07/15/21 1640 07/15/21 1814  TROPONINIHS 71* 78*     Chemistry Recent Labs  Lab 07/15/21 1640 07/16/21 0432 07/17/21 0603  NA 123* 126* 121*  K 5.1 5.2* 5.5*  CL 90* 92* 87*  CO2 26 26 24   GLUCOSE 98 127* 162*  BUN 39* 45* 62*  CREATININE 1.55* 1.71* 2.02*  CALCIUM 8.1* 8.6* 8.5*  MG  --  2.3 2.4  PROT 6.5 6.1*  --   ALBUMIN 3.2* 3.3*  --   AST 32 25  --   ALT 14 14  --   ALKPHOS 108 108  --   BILITOT 2.9* 2.3*  --   GFRNONAA 52* 46* 38*  ANIONGAP 7 8 10     Lipids No results for  input(s): CHOL, TRIG, HDL, LABVLDL, LDLCALC, CHOLHDL in the last 168 hours.  Hematology Recent Labs  Lab 07/15/21 1640 07/16/21 0432 07/17/21 0603  WBC 5.6 3.9* 8.2  RBC 3.56* 3.63* 3.53*  HGB 9.0* 9.1* 9.1*  HCT 28.6* 28.2* 27.8*  MCV 80.3 77.7* 78.8*  MCH 25.3* 25.1* 25.8*  MCHC 31.5 32.3 32.7  RDW 19.9* 20.2* 19.9*  PLT 169 149* 184   Thyroid  Recent Labs  Lab 07/16/21 0432  TSH 1.909    BNP Recent Labs  Lab 07/15/21 1640  BNP 1,987.6*    DDimer No results for input(s): DDIMER in the last 168 hours.   Radiology    DG Chest Portable 1 View  Result Date: 07/15/2021 CLINICAL DATA:  Shortness of breath, volume overload or COPD. EXAM: PORTABLE CHEST 1 VIEW COMPARISON:  Chest radiograph dated in June 14, 2021. FINDINGS: Heart is enlarged. Atherosclerotic calcification of the aortic arch. Pulmonary vascular congestion without evidence of pulmonary edema or pleural effusion. No focal consolidation. IMPRESSION: 1. Stable cardiomegaly with atherosclerotic calcification of the aortic arch. 2. Mild  pulmonary vascular congestion. No evidence of pulmonary edema or infiltrate. Electronically Signed   By: Keane Police D.O.   On: 07/15/2021 16:38    Cardiac Studies   2D echo 06/15/2021: 1. Left ventricular ejection fraction, by estimation, is <20%. The left  ventricle has severely decreased function. The left ventricle demonstrates  global hypokinesis. The left ventricular internal cavity size was severely  dilated. Left ventricular  diastolic parameters are indeterminate.   2. Right ventricular systolic function is severely reduced. The right  ventricular size is moderately enlarged. There is severely elevated  pulmonary artery systolic pressure. The estimated right ventricular  systolic pressure is 57.0 mmHg.   3. The mitral valve is normal in structure. No evidence of mitral valve  regurgitation. No evidence of mitral stenosis.   4. The aortic valve is normal in structure. Aortic valve regurgitation is  not visualized. Aortic valve sclerosis/calcification is present, without  any evidence of aortic stenosis.   5. The inferior vena cava is dilated in size with <50% respiratory  variability, suggesting right atrial pressure of 15 mmHg. ___________   2D echo 03/26/2021: 1. Left ventricular ejection fraction, by estimation, is 20 to 25%. The  left ventricle has severely decreased function. The left ventricle  demonstrates global hypokinesis. The left ventricular internal cavity size  was severely dilated. Left ventricular  diastolic function could not be evaluated.   2. Right ventricular systolic function is severely reduced. The right  ventricular size is mildly enlarged. There is severely elevated pulmonary  artery systolic pressure.   3. Left atrial size was mildly dilated.   4. Right atrial size was severely dilated.   5. The mitral valve is abnormal. Moderate mitral valve regurgitation.   6. Tricuspid valve regurgitation is moderate to severe.   7. The aortic valve has an  indeterminant number of cusps. There is  moderate calcification of the aortic valve. There is moderate thickening  of the aortic valve. Aortic valve regurgitation is not visualized. There  appears to be mild aortic stenosis, with   LVOT gradient underestimated due to low LVEF.   8. The inferior vena cava is normal in size with <50% respiratory  variability, suggesting right atrial pressure of 8 mmHg. __________   2D echo 06/17/2020:  1. Left ventricular ejection fraction, by estimation, is <20%. Left  ventricular ejection fraction by PLAX is 14 %. The left ventricle has  severely  decreased function. The left ventricle demonstrates global  hypokinesis. The left ventricular internal  cavity size was severely dilated. Left ventricular diastolic parameters  are indeterminate.   2. Right ventricular systolic function is severely reduced. The right  ventricular size is moderately enlarged. There is severely elevated  pulmonary artery systolic pressure. The estimated right ventricular  systolic pressure is 31.5 mmHg.   3. Left atrial size was severely dilated.   4. Right atrial size was severely dilated.   5. Tricuspid valve regurgitation is mild to moderate.   6. The inferior vena cava is dilated in size with <50% respiratory  variability, suggesting right atrial pressure of 15 mmHg. __________   LHC 01/20/2020: Prox RCA-1 lesion is 80% stenosed. Prox RCA-2 lesion is 20% stenosed. Prox RCA to Mid RCA lesion is 80% stenosed. Prox LAD lesion is 20% stenosed. 1st Diag lesion is 80% stenosed. Mid LAD lesion is 20% stenosed. Dist LAD lesion is 60% stenosed. Prox Cx to Mid Cx lesion is 90% stenosed. LPAV lesion is 95% stenosed.   1. Significant underlying three-vessel coronary artery disease. Patent proximal LAD stent with mild in-stent restenosis. Moderate diffuse disease in the distal LAD. Significant stenosis in the proximal left circumflex at the origin of the posterior AV groove artery  which is also heavily diseased at the ostium. This is a bifurcation lesion and heavily calcified. RCA stent is patent. However, there is significant proximal disease in the whole mid to distal segment is diffusely diseased and small caliber. 2. Left ventricular angiography was not performed. EF was severe reduced by echo. 3. Moderately elevated left ventricular end-diastolic pressure at 28 mmHg   Recommendations: The patient's cardiomyopathy seems to be out of proportion to his coronary artery disease as the LAD itself does not seem to have obstructive disease at the present time. The RCA is diffusely diseased and too small to stent. The only target for revascularization would be the left circumflex but it is a calcified bifurcation lesion and likely requires atherectomy. In order to do this safely, the patient has to be optimized from a heart failure standpoint as he appears to be volume overloaded. Recommend intravenous diuresis. If limited by hypotension, we might need to consider inotropic support and small dose norepinephrine drip initially. I added small dose digoxin. Treat underlying atrial fibrillation. Resume heparin drip 8 hours after sheath pull. __________   2D echo 01/20/2020: 1. Left ventricular ejection fraction, by estimation, is <20%. The left  ventricle has severely decreased function. The left ventricle demonstrates  global hypokinesis. The left ventricular internal cavity size was  moderately dilated. Left ventricular  diastolic parameters are indeterminate.   2. Right ventricular systolic function is moderately reduced. The right  ventricular size is moderately enlarged. There is normal pulmonary artery  systolic pressure. The estimated right ventricular systolic pressure is  40.0 mmHg.   3. Left atrial size was mildly dilated.   4. Mild mitral valve regurgitation.   5. Tricuspid valve regurgitation is moderate. __________   2D echo 09/28/2019: 1. Left ventricular  ejection fraction, by estimation, is 20 to 25%. The  left ventricle has severely decreased function. The left ventricle  demonstrates global hypokinesis. The left ventricular internal cavity size  was severely dilated. There is mild  left ventricular hypertrophy. Indeterminate diastolic filling due to E-A  fusion.   2. Right ventricular systolic function is moderately reduced. The right  ventricular size is mildly enlarged. There is moderately elevated  pulmonary artery systolic pressure. The estimated right  ventricular  systolic pressure is 63.8 mmHg.   3. Left atrial size was mildly dilated.   4. Right atrial size was moderately dilated.   5. The mitral valve is degenerative. Mild mitral valve regurgitation. No  evidence of mitral stenosis.   6. Tricuspid valve regurgitation is mild to moderate.   7. The aortic valve was not well visualized. Aortic valve regurgitation  is not visualized. Mild aortic valve sclerosis is present, with no  evidence of aortic valve stenosis.   8. The inferior vena cava is dilated in size with <50% respiratory  variability, suggesting right atrial pressure of 15 mmHg. ___________   2D echo 11/06/2018: 1. The left ventricle has severely reduced systolic function, with an  ejection fraction of 20-25%. The cavity size was moderately dilated.  Indeterminate diastolic filling due to E-A fusion. Left ventricular  diffuse hypokinesis.   2. The right ventricle has severely reduced systolic function. The cavity  was mildly enlarged. There is no increase in right ventricular wall  thickness. Right ventricular systolic pressure is moderately elevated with  an estimated pressure of 59.6 mmHg.   3. Left atrial size was mildly dilated.   4. Right atrial size was mildly dilated.   5. The mitral valve is degenerative. Mild thickening of the mitral valve  leaflet. Mild calcification of the mitral valve leaflet. There is mild  mitral annular calcification present.   6.  The tricuspid valve is degenerative. Tricuspid valve regurgitation is  moderate.   7. The aortic valve was not well visualized. Moderate thickening of the  aortic valve. Moderate calcification of the aortic valve.   8. The inferior vena cava was dilated in size with <50% respiratory  variability. __________   LHC 04/2017: Prox RCA-1 lesion is 70% stenosed. Prox RCA-2 lesion is 20% stenosed. Mid RCA lesion is 80% stenosed. Mid RCA to Dist RCA lesion is 70% stenosed. Ost LAD to Prox LAD lesion is 30% stenosed. Ost 1st Diag to 1st Diag lesion is 80% stenosed. Prox LAD to Mid LAD lesion is 60% stenosed. Dist LAD lesion is 60% stenosed. Prox Cx to Mid Cx lesion is 90% stenosed. Ost 1st Mrg to 1st Mrg lesion is 85% stenosed.   1.  Significant three-vessel coronary artery disease with patent stent in the RCA and LAD.  There is significant proximal RCA disease before the stent as well as diffuse mid and distal disease in a vessel that appears to be about 2.5 mm in diameter.  There is significant bifurcation stenosis in the proximal left circumflex with a large OM1.  The LAD has moderate disease. The coronary arteries are moderately calcified and diffusely diseased throughout. 2.  Left ventricular angiography was not performed.  EF was moderately to severely reduced by echo. 3.  Severely elevated left ventricular end-diastolic pressure at 34 mmHg.   Recommendations: This is overall a difficult situation.  Revascularization options are somewhat limited due to diffuse disease overall.  Given that he is diabetic and has cardiomyopathy, best option might be CABG if he is found to be a candidate.  We need to optimize his heart failure first with outpatient referral to cardiothoracic surgery for evaluation.  I am going to increase intravenous diuresis today and the patient possibly can be discharged home tomorrow on medical therapy.  Patient Profile     61 y.o. male male with a hx of with a hx of CAD  s/p prior PCI, chronic combined systolic and diastolic congestive heart failure, ICM, pulmonary hypertension, tobacco  abuse, COPD, HTN, HLD, permanent atrial fibrillation, obesity, obstructive sleep apnea, type 2 diabetes mellitus, stage III chronic kidney disease, peripheral arterial disease status post right BKA, and carotid arterial disease who is being seen 07/16/2021 for the evaluation of acute CHF   Assessment & Plan    Acute on chronic HFrEF - BNP elevated to 1987 and CXR with mild vascular congestion - PTA torsemide 40mg  BID and IV lasix 80mg  PRN - Also has h/o medication noncompliance - strict I/Os, daily weights - weight at last d/c on 1/10 was 182lbs. Weight on admission 208lbs, however unsure if this is accurate. - PTA losartan, spiro and coreg held for hypotension - digoxin held with AKI - IV lasix held for rising creatinine  - Kidney function is worse today - plan for RHC to evaluate hemodynamics, may need inotropic support. Hold Eliquis this AM for cath today. - overall prognosis is poor   Known severe CAD - troponin elevation likely demand ischemia - not a surgical candidate in the past - prior cath he left AMA - no plan for ischemic work-up.  - continue statin; Restart BB as able.    Permanent Afib - patient is rate controlled  - digoxin held for AKI - PTA coreg held for hypotension. Restart as able - continue Eliquis 5mg  BID for stroke ppx, hold this AM for cath as above, resume post-procedure   AKI Hyponatremia - trend with diuresis - nephrotoxic meds held   COPD with exacerbation - s/p solumedrol - per IM   PAD s/p R BKA - continue Plavix and Lipitor   Tobacco use - nicotine patch   GOC - palliative care consulted  For questions or updates, please contact [facility] Please consult [email or web address] for contact info under        Signed, Senai Kingsley Ninfa Meeker, PA-C  07/17/2021, 8:20 AM

## 2021-07-17 NOTE — Progress Notes (Signed)
Pt alert and oriented x 2-3. Symmetrical grips. Follows commands. R radial site level 0. R brachial site level 1, no ooze, small hematoma marked, MD made aware. CVP reading 30's despite being accurately applied. MD made aware. VSS on levo gtt and milrinone.

## 2021-07-17 NOTE — Progress Notes (Signed)
Progress Note  Patient Name: MEKAI WILKINSON Date of Encounter: 07/17/2021  [facility] Cardiologist: Ida Rogue, MD   Subjective   Kidney function worse this AM, sodium is also lower. Patient feels the same as yesterday, says coughing is worse. Feels abdomen is full.  Inpatient Medications    Scheduled Meds:  apixaban  5 mg Oral BID   atorvastatin  80 mg Oral Daily   clopidogrel  75 mg Oral Daily   digoxin  125 mcg Oral Daily   DULoxetine  60 mg Oral Daily   ezetimibe  10 mg Oral Daily   fluticasone furoate-vilanterol  1 puff Inhalation Daily   And   umeclidinium bromide  1 puff Inhalation Daily   gabapentin  800 mg Oral QID   ipratropium-albuterol  6 mL Nebulization QID   multivitamin with minerals  1 tablet Oral Daily   nicotine  21 mg Transdermal Daily   pantoprazole  40 mg Oral Daily   predniSONE  40 mg Oral Q breakfast   sodium chloride flush  3 mL Intravenous Q12H   tamsulosin  0.4 mg Oral QPC supper   Vericiguat  1 tablet Oral Daily   Continuous Infusions:  sodium chloride     PRN Meds: sodium chloride, acetaminophen **OR** acetaminophen, albuterol, ipratropium-albuterol, nitroGLYCERIN, oxyCODONE, prochlorperazine, senna-docusate, sodium chloride flush   Vital Signs    Vitals:   07/16/21 2339 07/17/21 0533 07/17/21 0708 07/17/21 0737  BP: 102/62 106/67  122/84  Pulse: 61 77  73  Resp: 18 19    Temp: 97.9 F (36.6 C) 98 F (36.7 C)    TempSrc:      SpO2: 97% 92% 92% 94%  Weight:  94.4 kg    Height:        Intake/Output Summary (Last 24 hours) at 07/17/2021 0820 Last data filed at 07/17/2021 0500 Gross per 24 hour  Intake 1800 ml  Output 900 ml  Net 900 ml   Last 3 Weights 07/17/2021 07/16/2021 07/15/2021  Weight (lbs) 208 lb 1.8 oz 209 lb 3.5 oz 208 lb 14.4 oz  Weight (kg) 94.4 kg 94.9 kg 94.756 kg  Some encounter information is confidential and restricted. Go to Review Flowsheets activity to see all data.      Telemetry    Afib, RBBB, HR  60-70 - Personally Reviewed  ECG    No new - Personally Reviewed  Physical Exam   GEN: No acute distress.   Neck: No JVD Cardiac: Irreg Irreg, no murmurs, rubs, or gallops.  Respiratory: Clear to auscultation bilaterally. GI:  +tender, +distended  MS: trace edema; No deformity. Neuro:  Nonfocal  Psych: Normal affect   Labs    High Sensitivity Troponin:   Recent Labs  Lab 07/15/21 1640 07/15/21 1814  TROPONINIHS 71* 78*     Chemistry Recent Labs  Lab 07/15/21 1640 07/16/21 0432 07/17/21 0603  NA 123* 126* 121*  K 5.1 5.2* 5.5*  CL 90* 92* 87*  CO2 26 26 24   GLUCOSE 98 127* 162*  BUN 39* 45* 62*  CREATININE 1.55* 1.71* 2.02*  CALCIUM 8.1* 8.6* 8.5*  MG  --  2.3 2.4  PROT 6.5 6.1*  --   ALBUMIN 3.2* 3.3*  --   AST 32 25  --   ALT 14 14  --   ALKPHOS 108 108  --   BILITOT 2.9* 2.3*  --   GFRNONAA 52* 46* 38*  ANIONGAP 7 8 10     Lipids No results for  input(s): CHOL, TRIG, HDL, LABVLDL, LDLCALC, CHOLHDL in the last 168 hours.  Hematology Recent Labs  Lab 07/15/21 1640 07/16/21 0432 07/17/21 0603  WBC 5.6 3.9* 8.2  RBC 3.56* 3.63* 3.53*  HGB 9.0* 9.1* 9.1*  HCT 28.6* 28.2* 27.8*  MCV 80.3 77.7* 78.8*  MCH 25.3* 25.1* 25.8*  MCHC 31.5 32.3 32.7  RDW 19.9* 20.2* 19.9*  PLT 169 149* 184   Thyroid  Recent Labs  Lab 07/16/21 0432  TSH 1.909    BNP Recent Labs  Lab 07/15/21 1640  BNP 1,987.6*    DDimer No results for input(s): DDIMER in the last 168 hours.   Radiology    DG Chest Portable 1 View  Result Date: 07/15/2021 CLINICAL DATA:  Shortness of breath, volume overload or COPD. EXAM: PORTABLE CHEST 1 VIEW COMPARISON:  Chest radiograph dated in June 14, 2021. FINDINGS: Heart is enlarged. Atherosclerotic calcification of the aortic arch. Pulmonary vascular congestion without evidence of pulmonary edema or pleural effusion. No focal consolidation. IMPRESSION: 1. Stable cardiomegaly with atherosclerotic calcification of the aortic arch. 2. Mild  pulmonary vascular congestion. No evidence of pulmonary edema or infiltrate. Electronically Signed   By: Keane Police D.O.   On: 07/15/2021 16:38    Cardiac Studies   2D echo 06/15/2021: 1. Left ventricular ejection fraction, by estimation, is <20%. The left  ventricle has severely decreased function. The left ventricle demonstrates  global hypokinesis. The left ventricular internal cavity size was severely  dilated. Left ventricular  diastolic parameters are indeterminate.   2. Right ventricular systolic function is severely reduced. The right  ventricular size is moderately enlarged. There is severely elevated  pulmonary artery systolic pressure. The estimated right ventricular  systolic pressure is 78.6 mmHg.   3. The mitral valve is normal in structure. No evidence of mitral valve  regurgitation. No evidence of mitral stenosis.   4. The aortic valve is normal in structure. Aortic valve regurgitation is  not visualized. Aortic valve sclerosis/calcification is present, without  any evidence of aortic stenosis.   5. The inferior vena cava is dilated in size with <50% respiratory  variability, suggesting right atrial pressure of 15 mmHg. ___________   2D echo 03/26/2021: 1. Left ventricular ejection fraction, by estimation, is 20 to 25%. The  left ventricle has severely decreased function. The left ventricle  demonstrates global hypokinesis. The left ventricular internal cavity size  was severely dilated. Left ventricular  diastolic function could not be evaluated.   2. Right ventricular systolic function is severely reduced. The right  ventricular size is mildly enlarged. There is severely elevated pulmonary  artery systolic pressure.   3. Left atrial size was mildly dilated.   4. Right atrial size was severely dilated.   5. The mitral valve is abnormal. Moderate mitral valve regurgitation.   6. Tricuspid valve regurgitation is moderate to severe.   7. The aortic valve has an  indeterminant number of cusps. There is  moderate calcification of the aortic valve. There is moderate thickening  of the aortic valve. Aortic valve regurgitation is not visualized. There  appears to be mild aortic stenosis, with   LVOT gradient underestimated due to low LVEF.   8. The inferior vena cava is normal in size with <50% respiratory  variability, suggesting right atrial pressure of 8 mmHg. __________   2D echo 06/17/2020:  1. Left ventricular ejection fraction, by estimation, is <20%. Left  ventricular ejection fraction by PLAX is 14 %. The left ventricle has  severely  decreased function. The left ventricle demonstrates global  hypokinesis. The left ventricular internal  cavity size was severely dilated. Left ventricular diastolic parameters  are indeterminate.   2. Right ventricular systolic function is severely reduced. The right  ventricular size is moderately enlarged. There is severely elevated  pulmonary artery systolic pressure. The estimated right ventricular  systolic pressure is 18.5 mmHg.   3. Left atrial size was severely dilated.   4. Right atrial size was severely dilated.   5. Tricuspid valve regurgitation is mild to moderate.   6. The inferior vena cava is dilated in size with <50% respiratory  variability, suggesting right atrial pressure of 15 mmHg. __________   LHC 01/20/2020: Prox RCA-1 lesion is 80% stenosed. Prox RCA-2 lesion is 20% stenosed. Prox RCA to Mid RCA lesion is 80% stenosed. Prox LAD lesion is 20% stenosed. 1st Diag lesion is 80% stenosed. Mid LAD lesion is 20% stenosed. Dist LAD lesion is 60% stenosed. Prox Cx to Mid Cx lesion is 90% stenosed. LPAV lesion is 95% stenosed.   1. Significant underlying three-vessel coronary artery disease. Patent proximal LAD stent with mild in-stent restenosis. Moderate diffuse disease in the distal LAD. Significant stenosis in the proximal left circumflex at the origin of the posterior AV groove artery  which is also heavily diseased at the ostium. This is a bifurcation lesion and heavily calcified. RCA stent is patent. However, there is significant proximal disease in the whole mid to distal segment is diffusely diseased and small caliber. 2. Left ventricular angiography was not performed. EF was severe reduced by echo. 3. Moderately elevated left ventricular end-diastolic pressure at 28 mmHg   Recommendations: The patient's cardiomyopathy seems to be out of proportion to his coronary artery disease as the LAD itself does not seem to have obstructive disease at the present time. The RCA is diffusely diseased and too small to stent. The only target for revascularization would be the left circumflex but it is a calcified bifurcation lesion and likely requires atherectomy. In order to do this safely, the patient has to be optimized from a heart failure standpoint as he appears to be volume overloaded. Recommend intravenous diuresis. If limited by hypotension, we might need to consider inotropic support and small dose norepinephrine drip initially. I added small dose digoxin. Treat underlying atrial fibrillation. Resume heparin drip 8 hours after sheath pull. __________   2D echo 01/20/2020: 1. Left ventricular ejection fraction, by estimation, is <20%. The left  ventricle has severely decreased function. The left ventricle demonstrates  global hypokinesis. The left ventricular internal cavity size was  moderately dilated. Left ventricular  diastolic parameters are indeterminate.   2. Right ventricular systolic function is moderately reduced. The right  ventricular size is moderately enlarged. There is normal pulmonary artery  systolic pressure. The estimated right ventricular systolic pressure is  63.1 mmHg.   3. Left atrial size was mildly dilated.   4. Mild mitral valve regurgitation.   5. Tricuspid valve regurgitation is moderate. __________   2D echo 09/28/2019: 1. Left ventricular  ejection fraction, by estimation, is 20 to 25%. The  left ventricle has severely decreased function. The left ventricle  demonstrates global hypokinesis. The left ventricular internal cavity size  was severely dilated. There is mild  left ventricular hypertrophy. Indeterminate diastolic filling due to E-A  fusion.   2. Right ventricular systolic function is moderately reduced. The right  ventricular size is mildly enlarged. There is moderately elevated  pulmonary artery systolic pressure. The estimated right  ventricular  systolic pressure is 10.1 mmHg.   3. Left atrial size was mildly dilated.   4. Right atrial size was moderately dilated.   5. The mitral valve is degenerative. Mild mitral valve regurgitation. No  evidence of mitral stenosis.   6. Tricuspid valve regurgitation is mild to moderate.   7. The aortic valve was not well visualized. Aortic valve regurgitation  is not visualized. Mild aortic valve sclerosis is present, with no  evidence of aortic valve stenosis.   8. The inferior vena cava is dilated in size with <50% respiratory  variability, suggesting right atrial pressure of 15 mmHg. ___________   2D echo 11/06/2018: 1. The left ventricle has severely reduced systolic function, with an  ejection fraction of 20-25%. The cavity size was moderately dilated.  Indeterminate diastolic filling due to E-A fusion. Left ventricular  diffuse hypokinesis.   2. The right ventricle has severely reduced systolic function. The cavity  was mildly enlarged. There is no increase in right ventricular wall  thickness. Right ventricular systolic pressure is moderately elevated with  an estimated pressure of 59.6 mmHg.   3. Left atrial size was mildly dilated.   4. Right atrial size was mildly dilated.   5. The mitral valve is degenerative. Mild thickening of the mitral valve  leaflet. Mild calcification of the mitral valve leaflet. There is mild  mitral annular calcification present.   6.  The tricuspid valve is degenerative. Tricuspid valve regurgitation is  moderate.   7. The aortic valve was not well visualized. Moderate thickening of the  aortic valve. Moderate calcification of the aortic valve.   8. The inferior vena cava was dilated in size with <50% respiratory  variability. __________   LHC 04/2017: Prox RCA-1 lesion is 70% stenosed. Prox RCA-2 lesion is 20% stenosed. Mid RCA lesion is 80% stenosed. Mid RCA to Dist RCA lesion is 70% stenosed. Ost LAD to Prox LAD lesion is 30% stenosed. Ost 1st Diag to 1st Diag lesion is 80% stenosed. Prox LAD to Mid LAD lesion is 60% stenosed. Dist LAD lesion is 60% stenosed. Prox Cx to Mid Cx lesion is 90% stenosed. Ost 1st Mrg to 1st Mrg lesion is 85% stenosed.   1.  Significant three-vessel coronary artery disease with patent stent in the RCA and LAD.  There is significant proximal RCA disease before the stent as well as diffuse mid and distal disease in a vessel that appears to be about 2.5 mm in diameter.  There is significant bifurcation stenosis in the proximal left circumflex with a large OM1.  The LAD has moderate disease. The coronary arteries are moderately calcified and diffusely diseased throughout. 2.  Left ventricular angiography was not performed.  EF was moderately to severely reduced by echo. 3.  Severely elevated left ventricular end-diastolic pressure at 34 mmHg.   Recommendations: This is overall a difficult situation.  Revascularization options are somewhat limited due to diffuse disease overall.  Given that he is diabetic and has cardiomyopathy, best option might be CABG if he is found to be a candidate.  We need to optimize his heart failure first with outpatient referral to cardiothoracic surgery for evaluation.  I am going to increase intravenous diuresis today and the patient possibly can be discharged home tomorrow on medical therapy.  Patient Profile     61 y.o. male male with a hx of with a hx of CAD  s/p prior PCI, chronic combined systolic and diastolic congestive heart failure, ICM, pulmonary hypertension, tobacco  abuse, COPD, HTN, HLD, permanent atrial fibrillation, obesity, obstructive sleep apnea, type 2 diabetes mellitus, stage III chronic kidney disease, peripheral arterial disease status post right BKA, and carotid arterial disease who is being seen 07/16/2021 for the evaluation of acute CHF   Assessment & Plan    Acute on chronic HFrEF - BNP elevated to 1987 and CXR with mild vascular congestion - PTA torsemide 40mg  BID and IV lasix 80mg  PRN - Also has h/o medication noncompliance - strict I/Os, daily weights - weight at last d/c on 1/10 was 182lbs. Weight on admission 208lbs, however unsure if this is accurate. - PTA losartan, spiro and coreg held for hypotension - digoxin held with AKI - IV lasix held for rising creatinine  - Kidney function is worse today - plan for RHC to evaluate hemodynamics, may need inotropic support. Hold Eliquis this AM for cath today. - overall prognosis is poor   Known severe CAD - troponin elevation likely demand ischemia - not a surgical candidate in the past - prior cath he left AMA - no plan for ischemic work-up.  - continue statin; Restart BB as able.    Permanent Afib - patient is rate controlled  - digoxin held for AKI - PTA coreg held for hypotension. Restart as able - continue Eliquis 5mg  BID for stroke ppx, hold this AM for cath as above, resume post-procedure   AKI Hyponatremia - trend with diuresis - nephrotoxic meds held   COPD with exacerbation - s/p solumedrol - per IM   PAD s/p R BKA - continue Plavix and Lipitor   Tobacco use - nicotine patch   GOC - palliative care consulted  For questions or updates, please contact [facility] Please consult [email or web address] for contact info under        Signed, Matteo Banke Ninfa Meeker, PA-C  07/17/2021, 8:20 AM

## 2021-07-17 NOTE — Interval H&P Note (Signed)
History and Physical Interval Note:  07/17/2021 10:29 AM  [name] [name]  has presented today for surgery, with the diagnosis of acute on chronic HFrEF.  The various methods of treatment have been discussed with the patient and family. After consideration of risks, benefits and other options for treatment, the patient has consented to  Procedure(s): RIGHT HEART CATH (N/A) as a surgical intervention.  The patient's history has been reviewed, patient examined, no change in status, stable for surgery.  I have reviewed the patient's chart and labs.  Questions were answered to the patient's satisfaction.     [name]

## 2021-07-18 DIAGNOSIS — I2510 Atherosclerotic heart disease of native coronary artery without angina pectoris: Secondary | ICD-10-CM | POA: Diagnosis not present

## 2021-07-18 DIAGNOSIS — I4821 Permanent atrial fibrillation: Secondary | ICD-10-CM | POA: Diagnosis not present

## 2021-07-18 DIAGNOSIS — I5023 Acute on chronic systolic (congestive) heart failure: Secondary | ICD-10-CM | POA: Diagnosis not present

## 2021-07-18 LAB — GLUCOSE, CAPILLARY
Glucose-Capillary: 160 mg/dL — ABNORMAL HIGH (ref 70–99)
Glucose-Capillary: 171 mg/dL — ABNORMAL HIGH (ref 70–99)
Glucose-Capillary: 182 mg/dL — ABNORMAL HIGH (ref 70–99)
Glucose-Capillary: 187 mg/dL — ABNORMAL HIGH (ref 70–99)

## 2021-07-18 LAB — COOXEMETRY PANEL
Carboxyhemoglobin: 1.9 % — ABNORMAL HIGH (ref 0.5–1.5)
Methemoglobin: 0 % (ref 0.0–1.5)
O2 Saturation: 76.5 %

## 2021-07-18 LAB — BASIC METABOLIC PANEL
Anion gap: 7 (ref 5–15)
BUN: 64 mg/dL — ABNORMAL HIGH (ref 6–20)
CO2: 29 mmol/L (ref 22–32)
Calcium: 8.5 mg/dL — ABNORMAL LOW (ref 8.9–10.3)
Chloride: 84 mmol/L — ABNORMAL LOW (ref 98–111)
Creatinine, Ser: 1.78 mg/dL — ABNORMAL HIGH (ref 0.61–1.24)
GFR, Estimated: 44 mL/min — ABNORMAL LOW (ref >60)
Glucose, Bld: 219 mg/dL — ABNORMAL HIGH (ref 70–99)
Potassium: 4.7 mmol/L (ref 3.5–5.1)
Sodium: 120 mmol/L — ABNORMAL LOW (ref 135–145)

## 2021-07-18 LAB — CBC
HCT: 25.9 % — ABNORMAL LOW (ref 39.0–52.0)
Hemoglobin: 8.3 g/dL — ABNORMAL LOW (ref 13.0–17.0)
MCH: 25.1 pg — ABNORMAL LOW (ref 26.0–34.0)
MCHC: 32 g/dL (ref 30.0–36.0)
MCV: 78.2 fL — ABNORMAL LOW (ref 80.0–100.0)
Platelets: 121 10*3/uL — ABNORMAL LOW (ref 150–400)
RBC: 3.31 MIL/uL — ABNORMAL LOW (ref 4.22–5.81)
RDW: 19.9 % — ABNORMAL HIGH (ref 11.5–15.5)
WBC: 6.5 10*3/uL (ref 4.0–10.5)
nRBC: 0 % (ref 0.0–0.2)

## 2021-07-18 LAB — MAGNESIUM: Magnesium: 2.3 mg/dL (ref 1.7–2.4)

## 2021-07-18 MED ORDER — DOCUSATE SODIUM 100 MG PO CAPS
100.0000 mg | ORAL_CAPSULE | Freq: Every day | ORAL | Status: DC
  Administered 2021-07-18 – 2021-07-20 (×3): 100 mg via ORAL
  Filled 2021-07-18 (×3): qty 1

## 2021-07-18 MED ORDER — SODIUM CHLORIDE 0.9% FLUSH
10.0000 mL | Freq: Two times a day (BID) | INTRAVENOUS | Status: DC
  Administered 2021-07-18: 10 mL
  Administered 2021-07-19: 20 mL
  Administered 2021-07-20: 10 mL
  Administered 2021-07-20: 20 mL

## 2021-07-18 NOTE — Progress Notes (Signed)
Progress Note  Patient Name: [name] [name] Date of Encounter: 07/18/2021  [facility] Cardiologist: Ida Rogue, MD   Subjective   Rhc showed severe CAD, prior to previous cath, severely elevated left heart, right heart and pulmonary artery pressures. Patient was transferred to the ICU for milrinone. PICC line was placed.   UOP -2.8L. kidney function improving.Sodium still low. Patient is feeling better today, reports breathing is better.   Inpatient Medications    Scheduled Meds:  apixaban  5 mg Oral BID   atorvastatin  80 mg Oral Daily   Chlorhexidine Gluconate Cloth  6 each Topical Daily   clopidogrel  75 mg Oral Daily   DULoxetine  60 mg Oral Daily   ezetimibe  10 mg Oral Daily   fluticasone furoate-vilanterol  1 puff Inhalation Daily   And   umeclidinium bromide  1 puff Inhalation Daily   furosemide  80 mg Intravenous BID   gabapentin  800 mg Oral QID   insulin aspart  0-15 Units Subcutaneous TID WC   insulin aspart  0-5 Units Subcutaneous QHS   ipratropium-albuterol  3 mL Nebulization QID   multivitamin with minerals  1 tablet Oral Daily   nicotine  21 mg Transdermal Daily   pantoprazole  40 mg Oral Daily   predniSONE  40 mg Oral Q breakfast   sodium chloride flush  10-40 mL Intracatheter Q12H   sodium chloride flush  3 mL Intravenous Q12H   sodium chloride flush  3 mL Intravenous Q12H   tamsulosin  0.4 mg Oral QPC supper   Continuous Infusions:  sodium chloride     sodium chloride     sodium chloride Stopped (07/17/21 1949)   milrinone 0.125 mcg/kg/min (07/18/21 1106)   norepinephrine (LEVOPHED) Adult infusion Stopped (07/17/21 2328)   PRN Meds: sodium chloride, sodium chloride, acetaminophen **OR** acetaminophen, albuterol, ipratropium-albuterol, nitroGLYCERIN, oxyCODONE, prochlorperazine, senna-docusate, sodium chloride flush, sodium chloride flush, sodium chloride flush   Vital Signs    Vitals:   07/18/21 0500 07/18/21 0530 07/18/21 0600 07/18/21  0737  BP:  120/87 126/78 99/74  Pulse: 90 87 97 89  Resp: 13 14 14    Temp:    98 F (36.7 C)  TempSrc:    Oral  SpO2: 93% 100% 96% 96%  Weight: 93.7 kg     Height:        Intake/Output Summary (Last 24 hours) at 07/18/2021 1134 Last data filed at 07/18/2021 0900 Gross per 24 hour  Intake 1612.97 ml  Output 3000 ml  Net -1387.03 ml   Last 3 Weights 07/18/2021 07/17/2021 07/17/2021  Weight (lbs) 206 lb 9.1 oz 212 lb 1.3 oz 208 lb 1.8 oz  Weight (kg) 93.7 kg 96.2 kg 94.4 kg  Some encounter information is confidential and restricted. Go to Review Flowsheets activity to see all data.      Telemetry    Afib HR 90s - Personally Reviewed  ECG    No new - Personally Reviewed  Physical Exam   GEN: No acute distress.   Neck: No JVD Cardiac: Irreg Irreg, no murmurs, rubs, or gallops.  Respiratory: wheezing, course breath sounds GI: Soft, nontender, non-distended  MS: No edema; Rt BKA Neuro:  Nonfocal  Psych: Normal affect   Labs    High Sensitivity Troponin:   Recent Labs  Lab 07/15/21 1640 07/15/21 1814  TROPONINIHS 71* 78*     Chemistry Recent Labs  Lab 07/15/21 1640 07/16/21 0432 07/17/21 0603 07/17/21 1811 07/18/21 0526  NA  123* 126* 121* 121* 120*  K 5.1 5.2* 5.5* 5.4* 4.7  CL 90* 92* 87* 86* 84*  CO2 26 26 24 26 29   GLUCOSE 98 127* 162* 252* 219*  BUN 39* 45* 62* 65* 64*  CREATININE 1.55* 1.71* 2.02* 2.06* 1.78*  CALCIUM 8.1* 8.6* 8.5* 8.7* 8.5*  MG  --  2.3 2.4  --  2.3  PROT 6.5 6.1*  --   --   --   ALBUMIN 3.2* 3.3*  --   --   --   AST 32 25  --   --   --   ALT 14 14  --   --   --   ALKPHOS 108 108  --   --   --   BILITOT 2.9* 2.3*  --   --   --   GFRNONAA 52* 46* 38* 37* 44*  ANIONGAP 7 8 10 9 7     Lipids No results for input(s): CHOL, TRIG, HDL, LABVLDL, LDLCALC, CHOLHDL in the last 168 hours.  Hematology Recent Labs  Lab 07/16/21 0432 07/17/21 0603 07/18/21 0526  WBC 3.9* 8.2 6.5  RBC 3.63* 3.53* 3.31*  HGB 9.1* 9.1* 8.3*  HCT 28.2* 27.8*  25.9*  MCV 77.7* 78.8* 78.2*  MCH 25.1* 25.8* 25.1*  MCHC 32.3 32.7 32.0  RDW 20.2* 19.9* 19.9*  PLT 149* 184 121*   Thyroid  Recent Labs  Lab 07/16/21 0432  TSH 1.909    BNP Recent Labs  Lab 07/15/21 1640  BNP 1,987.6*    DDimer No results for input(s): DDIMER in the last 168 hours.   Radiology    CARDIAC CATHETERIZATION  Result Date: 07/17/2021 Conclusions: Severe multivessel coronary artery disease, similar to prior catheterization from 01/2020. Severely elevated left heart, right heart, and pulmonary artery pressures. Moderately-severely reduced Fick cardiac output/index. Recommendations: Medical management of stable severe coronary artery disease. Transfer to ICU for initiation of milrinone.  PICC line to be placed by IV team for infusion of vasoactive medications as well as hemodynamic monitoring. Discontinue vericiguat due to soft BP and continue to hold carvedilol, losartan, and spironolactone.  Addition of norepinephrine may need to be considered if hypotension worsens. Restart furosemide at 80 mg IV BID (first dose given during catheterization).  Further dose escalation may be necessary based on response to furosemide and milrinone. Restart apixaban 5 mg BID tonight if no evidence of bleeding/vascular injury at catheterization sites. Nelva Bush, MD [facility]  DG Chest Port 1 View  Result Date: 07/17/2021 CLINICAL DATA:  PICC central catheter line placement EXAM: PORTABLE CHEST 1 VIEW COMPARISON:  07/15/2021 05/27/2021 FINDINGS: Cardiac silhouette is again moderately to markedly enlarged. Calcification is again seen within the aortic arch. New left upper extremity PICC tip overlies the superior vena cava/right atrial junction. The right costophrenic angle is not imaged. Unchanged mild bilateral interstitial thickening. There is mild cephalization of the pulmonary vasculature, unchanged. No pleural effusion is seen. No pneumothorax. No acute skeletal abnormality.  IMPRESSION: No significant change from 07/15/2021. Possible mild interstitial pulmonary edema. Moderately to markedly enlarged cardiac silhouette is unchanged from prior. Electronically Signed   By: Yvonne Kendall M.D.   On: 07/17/2021 17:41   [place] EKG SITE RITE  Result Date: 07/17/2021 If [facility] image not attached, placement could not be confirmed due to current cardiac rhythm.   Cardiac Studies   RHC 07/17/21 Conclusions: Severe multivessel coronary artery disease, similar to prior catheterization from 01/2020. Severely elevated left heart, right heart, and pulmonary artery  pressures. Moderately-severely reduced Fick cardiac output/index.   Recommendations: Medical management of stable severe coronary artery disease. Transfer to ICU for initiation of milrinone.  PICC line to be placed by IV team for infusion of vasoactive medications as well as hemodynamic monitoring. Discontinue vericiguat due to soft BP and continue to hold carvedilol, losartan, and spironolactone.  Addition of norepinephrine may need to be considered if hypotension worsens. Restart furosemide at 80 mg IV BID (first dose given during catheterization).  Further dose escalation may be necessary based on response to furosemide and milrinone. Restart apixaban 5 mg BID tonight if no evidence of bleeding/vascular injury at catheterization sites.   Nelva Bush, MD [facility]   2D echo 06/15/2021: 1. Left ventricular ejection fraction, by estimation, is <20%. The left  ventricle has severely decreased function. The left ventricle demonstrates  global hypokinesis. The left ventricular internal cavity size was severely  dilated. Left ventricular  diastolic parameters are indeterminate.   2. Right ventricular systolic function is severely reduced. The right  ventricular size is moderately enlarged. There is severely elevated  pulmonary artery systolic pressure. The estimated right ventricular  systolic pressure is 01.7  mmHg.   3. The mitral valve is normal in structure. No evidence of mitral valve  regurgitation. No evidence of mitral stenosis.   4. The aortic valve is normal in structure. Aortic valve regurgitation is  not visualized. Aortic valve sclerosis/calcification is present, without  any evidence of aortic stenosis.   5. The inferior vena cava is dilated in size with <50% respiratory  variability, suggesting right atrial pressure of 15 mmHg. ___________   2D echo 03/26/2021: 1. Left ventricular ejection fraction, by estimation, is 20 to 25%. The  left ventricle has severely decreased function. The left ventricle  demonstrates global hypokinesis. The left ventricular internal cavity size  was severely dilated. Left ventricular  diastolic function could not be evaluated.   2. Right ventricular systolic function is severely reduced. The right  ventricular size is mildly enlarged. There is severely elevated pulmonary  artery systolic pressure.   3. Left atrial size was mildly dilated.   4. Right atrial size was severely dilated.   5. The mitral valve is abnormal. Moderate mitral valve regurgitation.   6. Tricuspid valve regurgitation is moderate to severe.   7. The aortic valve has an indeterminant number of cusps. There is  moderate calcification of the aortic valve. There is moderate thickening  of the aortic valve. Aortic valve regurgitation is not visualized. There  appears to be mild aortic stenosis, with   LVOT gradient underestimated due to low LVEF.   8. The inferior vena cava is normal in size with <50% respiratory  variability, suggesting right atrial pressure of 8 mmHg. __________   2D echo 06/17/2020:  1. Left ventricular ejection fraction, by estimation, is <20%. Left  ventricular ejection fraction by PLAX is 14 %. The left ventricle has  severely decreased function. The left ventricle demonstrates global  hypokinesis. The left ventricular internal  cavity size was severely  dilated. Left ventricular diastolic parameters  are indeterminate.   2. Right ventricular systolic function is severely reduced. The right  ventricular size is moderately enlarged. There is severely elevated  pulmonary artery systolic pressure. The estimated right ventricular  systolic pressure is 51.0 mmHg.   3. Left atrial size was severely dilated.   4. Right atrial size was severely dilated.   5. Tricuspid valve regurgitation is mild to moderate.   6. The inferior vena  cava is dilated in size with <50% respiratory  variability, suggesting right atrial pressure of 15 mmHg. __________   LHC 01/20/2020: Prox RCA-1 lesion is 80% stenosed. Prox RCA-2 lesion is 20% stenosed. Prox RCA to Mid RCA lesion is 80% stenosed. Prox LAD lesion is 20% stenosed. 1st Diag lesion is 80% stenosed. Mid LAD lesion is 20% stenosed. Dist LAD lesion is 60% stenosed. Prox Cx to Mid Cx lesion is 90% stenosed. LPAV lesion is 95% stenosed.   1. Significant underlying three-vessel coronary artery disease. Patent proximal LAD stent with mild in-stent restenosis. Moderate diffuse disease in the distal LAD. Significant stenosis in the proximal left circumflex at the origin of the posterior AV groove artery which is also heavily diseased at the ostium. This is a bifurcation lesion and heavily calcified. RCA stent is patent. However, there is significant proximal disease in the whole mid to distal segment is diffusely diseased and small caliber. 2. Left ventricular angiography was not performed. EF was severe reduced by echo. 3. Moderately elevated left ventricular end-diastolic pressure at 28 mmHg   Recommendations: The patient's cardiomyopathy seems to be out of proportion to his coronary artery disease as the LAD itself does not seem to have obstructive disease at the present time. The RCA is diffusely diseased and too small to stent. The only target for revascularization would be the left circumflex but it is a  calcified bifurcation lesion and likely requires atherectomy. In order to do this safely, the patient has to be optimized from a heart failure standpoint as he appears to be volume overloaded. Recommend intravenous diuresis. If limited by hypotension, we might need to consider inotropic support and small dose norepinephrine drip initially. I added small dose digoxin. Treat underlying atrial fibrillation. Resume heparin drip 8 hours after sheath pull. __________   2D echo 01/20/2020: 1. Left ventricular ejection fraction, by estimation, is <20%. The left  ventricle has severely decreased function. The left ventricle demonstrates  global hypokinesis. The left ventricular internal cavity size was  moderately dilated. Left ventricular  diastolic parameters are indeterminate.   2. Right ventricular systolic function is moderately reduced. The right  ventricular size is moderately enlarged. There is normal pulmonary artery  systolic pressure. The estimated right ventricular systolic pressure is  16.0 mmHg.   3. Left atrial size was mildly dilated.   4. Mild mitral valve regurgitation.   5. Tricuspid valve regurgitation is moderate. __________   2D echo 09/28/2019: 1. Left ventricular ejection fraction, by estimation, is 20 to 25%. The  left ventricle has severely decreased function. The left ventricle  demonstrates global hypokinesis. The left ventricular internal cavity size  was severely dilated. There is mild  left ventricular hypertrophy. Indeterminate diastolic filling due to E-A  fusion.   2. Right ventricular systolic function is moderately reduced. The right  ventricular size is mildly enlarged. There is moderately elevated  pulmonary artery systolic pressure. The estimated right ventricular  systolic pressure is 10.9 mmHg.   3. Left atrial size was mildly dilated.   4. Right atrial size was moderately dilated.   5. The mitral valve is degenerative. Mild mitral valve regurgitation.  No  evidence of mitral stenosis.   6. Tricuspid valve regurgitation is mild to moderate.   7. The aortic valve was not well visualized. Aortic valve regurgitation  is not visualized. Mild aortic valve sclerosis is present, with no  evidence of aortic valve stenosis.   8. The inferior vena cava is dilated in size with <50%  respiratory  variability, suggesting right atrial pressure of 15 mmHg. ___________   2D echo 11/06/2018: 1. The left ventricle has severely reduced systolic function, with an  ejection fraction of 20-25%. The cavity size was moderately dilated.  Indeterminate diastolic filling due to E-A fusion. Left ventricular  diffuse hypokinesis.   2. The right ventricle has severely reduced systolic function. The cavity  was mildly enlarged. There is no increase in right ventricular wall  thickness. Right ventricular systolic pressure is moderately elevated with  an estimated pressure of 59.6 mmHg.   3. Left atrial size was mildly dilated.   4. Right atrial size was mildly dilated.   5. The mitral valve is degenerative. Mild thickening of the mitral valve  leaflet. Mild calcification of the mitral valve leaflet. There is mild  mitral annular calcification present.   6. The tricuspid valve is degenerative. Tricuspid valve regurgitation is  moderate.   7. The aortic valve was not well visualized. Moderate thickening of the  aortic valve. Moderate calcification of the aortic valve.   8. The inferior vena cava was dilated in size with <50% respiratory  variability. __________   LHC 04/2017: Prox RCA-1 lesion is 70% stenosed. Prox RCA-2 lesion is 20% stenosed. Mid RCA lesion is 80% stenosed. Mid RCA to Dist RCA lesion is 70% stenosed. Ost LAD to Prox LAD lesion is 30% stenosed. Ost 1st Diag to 1st Diag lesion is 80% stenosed. Prox LAD to Mid LAD lesion is 60% stenosed. Dist LAD lesion is 60% stenosed. Prox Cx to Mid Cx lesion is 90% stenosed. Ost 1st Mrg to 1st Mrg lesion is  85% stenosed.   1.  Significant three-vessel coronary artery disease with patent stent in the RCA and LAD.  There is significant proximal RCA disease before the stent as well as diffuse mid and distal disease in a vessel that appears to be about 2.5 mm in diameter.  There is significant bifurcation stenosis in the proximal left circumflex with a large OM1.  The LAD has moderate disease. The coronary arteries are moderately calcified and diffusely diseased throughout. 2.  Left ventricular angiography was not performed.  EF was moderately to severely reduced by echo. 3.  Severely elevated left ventricular end-diastolic pressure at 34 mmHg.   Recommendations: This is overall a difficult situation.  Revascularization options are somewhat limited due to diffuse disease overall.  Given that he is diabetic and has cardiomyopathy, best option might be CABG if he is found to be a candidate.  We need to optimize his heart failure first with outpatient referral to cardiothoracic surgery for evaluation.  I am going to increase intravenous diuresis today and the patient possibly can be discharged home tomorrow on medical therapy.  Patient Profile     61 y.o. male with a hx of CAD s/p prior PCI, chronic combined systolic and diastolic congestive heart failure, ICM, pulmonary hypertension, tobacco abuse, COPD, HTN, HLD, permanent atrial fibrillation, obesity, obstructive sleep apnea, type 2 diabetes mellitus, stage III chronic kidney disease, peripheral arterial disease status post right BKA, and carotid arterial disease who is being seen 07/16/2021 for the evaluation of acute CHF.  Assessment & Plan    Low output acute on chronic HFrEF - BNP elevated to 1987 and CXR with mild vascular congestion - PTA torsemide 40mg  BID and IV lasix 80mg  PRN - RHC cath as above. Patient transferred to the ICU for milrinone and IV lasix 80mg  BID - Echo 06/15/21 showed LVEF <20%, indeterminate diastolic parameters, RV  systolic  severely reduced, severely elevated pulmonary artery pressure - UOP -2.8L, net -1.3L - weight trending down - PTA losartan, spiro and coreg held for hypotension - strict I/Os, daily weights, trend kidney function - weight at last d/c on 1/10 was 182lbs. Weight on admission 208lbs, however unsure if this is accurate. - overall prognosis is poor   Known severe CAD - troponin elevation likely demand ischemia - not a surgical candidate in the past - cath showed stable disease - cath site is stable  - continue statin; Restart BB as able.    Permanent Afib - patient is rate controlled on digoxin>>may need to hold with AKI - PTA coreg held for hypotension.  - continue Eliquis 5mg  BID for stroke ppx   AKI Hyponatremia - trend with diuresis - nephrotoxic meds held   COPD with exacerbation - s/p solumedrol - per IM   PAD s/p R BKA - continue Plavix and Lipitor - podiatry and VVS following   Tobacco use - nicotine patch   GOC - palliative care consulted  For questions or updates, please contact [facility] Please consult [email or web address] for contact info under        Signed, Srihith Aquilino Ninfa Meeker, PA-C  07/18/2021, 11:34 AM

## 2021-07-18 NOTE — Progress Notes (Signed)
NAME:  [name] [name], MRN:  [id number], DOB:  Oct 05, 1960, LOS: 3 ADMISSION DATE:  07/15/2021, CONSULTATION DATE:  07/17/2021 REFERRING MD:  Dr. Billie Ruddy, CHIEF COMPLAINT:  Shortness of Breath & Edema   Brief Pt Description / Synopsis:  61 y.o. Male admitted with Acute on Chronic HFrEF (LVEF <20%) with Cardiogenic shock and concern for developing Cardiorenal Syndrome.  Being placed on Milrinone and Levophed infusions.  History of Present Illness:  [name] [name] is a 61 year old male with a past medical history significant for severe multivessel CAD, HFrEF (LVEF less than 20%), pulmonary hypertension, permanent atrial fibrillation on Eliquis, hypertension, hyperlipidemia, and chronic kidney disease who presented to [facility] ED on 07/15/2021 due to complaints of shortness of breath and lower extremity edema.  He was seen at the CHF clinic approximately 3 days ago where he appeared to be fluid overloaded with soft blood pressures.  He was referred to the ED at that time for further evaluation, but he refused to seek medical evaluation at that time.  He reported increasing shortness of breath and lower extremity edema, along with associated generalized malaise and fatigue.  He denied fever, chills, abdominal pain, nausea, vomiting, diarrhea, dysuria.  He reported he has been compliant with his home digoxin, Plavix, Eliquis.  It was unclear if he had been taking his diuretic as prescribed.  He also endorses that he continues to smoke, and has increased to 2 packs a day from 1 pack a day in the last 3 to 4 weeks.  Denied alcohol or illicit drug use.  Pertinent  Medical History  Severe multivessel coronary artery disease Chronic HFrEF (LVEF less than 20%) Permanent atrial fibrillation on Eliquis Peripheral artery disease status post right BKA Hypertension Hyperlipidemia COPD Pulmonary artery hypertension Sleep apnea Chronic kidney disease Diabetes mellitus type 2 BPH Arthritis Diabetic neuropathy  Micro Data:   2/5: SARS-CoV-2 and influenza PCR>> negative  Antimicrobials:  N/A  Significant Hospital Events: Including procedures, antibiotic start and stop dates in addition to other pertinent events   2/5: Presented to ED with complaints of shortness of breath and lower extremity edema.  Admitted by the hospitalist 2/6: Cardiology, podiatry and vascular surgery consulted. LLE wounds debrided. Diuretics held due to worsening AKI 2/7: Cardiac catheterization performed due to concern for low output heart failure and developing cardiorenal syndrome.  Returns to the ICU post cath requiring Levophed with plan to place PICC line and start milrinone infusion.  PCCM consulted   Objective   Blood pressure 99/74, pulse 89, temperature 98 F (36.7 C), temperature source Oral, resp. rate 14, height 5\' 7"  (1.702 m), weight 93.7 kg, SpO2 96 %. CVP:  [3 mmHg-39 mmHg] 12 mmHg      Intake/Output Summary (Last 24 hours) at 07/18/2021 1052 Last data filed at 07/18/2021 0900 Gross per 24 hour  Intake 1612.97 ml  Output 3000 ml  Net -1387.03 ml    Filed Weights   07/17/21 1023 07/17/21 1400 07/18/21 0500  Weight: 94.4 kg 96.2 kg 93.7 kg    Examination: General: Acute on chronically ill-appearing male, sitting in bed, on room air, no acute distress HENT: Atraumatic, normocephalic, neck supple, positive JVD Lungs: Fine crackles bilateral upper fields, diminished bilateral lower fields with mild expiratory wheeze, even, nonlabored, no accessory muscle use Cardiovascular: Regular rate and rhythm, no murmurs, rubs, gallops Abdomen: Obese, soft, nontender, nondistended, no guarding rebound tenderness, bowel sounds positive x4 Extremities: Right BKA, LLE with chronic wounds Neuro: Awake and alert, oriented x3, follows commands, no  focal deficits, speech clear GU: Deferred  Resolved Hospital Problem list     Assessment & Plan:   Cardiogenic Shock Acute on Chronic HFrEF (LVEF <20% on Echo 06/15/21) Mildly  Elevated Troponin, suspect demand ischemia Permanent A. Fib on Eliquis PMHx: severe CAD, PAD, tobacco use (smokes 2 PPD) -Continuous cardiac monitoring -Maintain MAP >65 -Vasopressors as needed to maintain MAP goal -Cardiology following, appreciate input -Plan to start milrinone infusion per cardiology ~ follow Co-ox panel -Hold antihypertensives -Diuresis as blood pressure and renal function permits ~currently on Lasix 80 mg twice daily -Continue Eliquis for anticoagulation -Echo 06/15/21: LVEF <20%, indeterminate diastolic parameters, RV systolic function severely reduced, severely elevated pulmonary artery systolic pressure -Cardiac Cath 2/7: Severe multivessel coronary artery disease, similar to prior catheterization from 01/2020. Severely elevated left heart, right heart, and pulmonary artery pressures. Moderately-severely reduced Fick cardiac output/index.  Acute Kidney Injury on CKD Concern for developing Cardiorenal Syndrome Hyponatremia, suspect Hypotonic Hypervolemic in setting of CHF Hyperkalemia PMHx: BPH -Monitor I&O's / urinary output -Follow BMP -Ensure adequate renal perfusion -Avoid nephrotoxic agents as able -Replace electrolytes as indicated -Inotropes as above -Diuresis as renal function and blood pressure permits -Consider nephrology consult -Continue Flomax  Acute COPD exacerbation PMHx: Pulmonary hypertension -Supplemental O2 as needed to maintain O2 sats 88 to 92% -Follow intermittent chest x-ray and ABG as needed -Continue bronchodilators, Breo Ellipta, Incruse Ellipta -Continue prednisone 40 mg daily  LLE wound PMHx: PAD s/p BKA of RLE -Continue Plavix and Lipitor -Podiatry following, appreciate input ~ s/p Debridement of wounds on 2/6, further wound care as per Podiatry -Vascular Surgery following, appreciate input ~ tentative plan for angiogram on Wednesday 2/8  Diabetes Mellitus Type II -CBG's ac &hs ; Target range of 140 to 180 -SSI -Follow ICU  Hypo/Hyperglycemia protocol     Pt is critically ill with severe HFrEF and concern for developing Cardiorenal syndrome, superimposed on multiple chronic co-morbidities.  Being placed on inotropes.  Prognosis is guarded, high risk for cardiac arrest and death.  Overall long term prognosis is poor.  Recommend DNR status.  Consider Palliative Care consult.  Best Practice (right click and "Reselect all SmartList Selections" daily)   Diet/type: Regular consistency (see orders) DVT prophylaxis: DOAC GI prophylaxis: PPI Lines: N/A Foley:  N/A Code Status:  full code Last date of multidisciplinary goals of care discussion [N/A]  Pt's significant other updated at bedside 07/17/21.  All questions answered to their satisfaction.  Labs   CBC: Recent Labs  Lab 07/15/21 1640 07/16/21 0432 07/17/21 0603 07/18/21 0526  WBC 5.6 3.9* 8.2 6.5  HGB 9.0* 9.1* 9.1* 8.3*  HCT 28.6* 28.2* 27.8* 25.9*  MCV 80.3 77.7* 78.8* 78.2*  PLT 169 149* 184 121*     Basic Metabolic Panel: Recent Labs  Lab 07/15/21 1640 07/16/21 0432 07/17/21 0603 07/17/21 1811 07/18/21 0526  NA 123* 126* 121* 121* 120*  K 5.1 5.2* 5.5* 5.4* 4.7  CL 90* 92* 87* 86* 84*  CO2 26 26 24 26 29   GLUCOSE 98 127* 162* 252* 219*  BUN 39* 45* 62* 65* 64*  CREATININE 1.55* 1.71* 2.02* 2.06* 1.78*  CALCIUM 8.1* 8.6* 8.5* 8.7* 8.5*  MG  --  2.3 2.4  --  2.3    GFR: Estimated Creatinine Clearance: 49.3 mL/min (A) (by C-G formula based on SCr of 1.78 mg/dL (H)). Recent Labs  Lab 07/15/21 1640 07/16/21 0432 07/17/21 0603 07/18/21 0526  PROCALCITON <0.10 0.11  --   --   WBC 5.6 3.9*  8.2 6.5     Liver Function Tests: Recent Labs  Lab 07/15/21 1640 07/16/21 0432  AST 32 25  ALT 14 14  ALKPHOS 108 108  BILITOT 2.9* 2.3*  PROT 6.5 6.1*  ALBUMIN 3.2* 3.3*    No results for input(s): LIPASE, AMYLASE in the last 168 hours. No results for input(s): AMMONIA in the last 168 hours.  ABG    Component Value Date/Time    HCO3 36.4 (H) 08/30/2019 2158   O2SAT 76.5 07/18/2021 0526      Coagulation Profile: Recent Labs  Lab 07/15/21 1640  INR 1.4*     Cardiac Enzymes: No results for input(s): CKTOTAL, CKMB, CKMBINDEX, TROPONINI in the last 168 hours.  HbA1C: Hemoglobin A1C  Date/Time Value Ref Range Status  04/04/2013 04:16 AM 9.8 (H) 4.2 - 6.3 % Final    Comment:    The American Diabetes Association recommends that a primary goal of therapy should be <7% and that physicians should reevaluate the treatment regimen in patients with HbA1c values consistently >8%.    Hgb A1c MFr Bld  Date/Time Value Ref Range Status  06/17/2021 05:56 AM 6.1 (H) 4.8 - 5.6 % Final    Comment:    (NOTE) Pre diabetes:          5.7%-6.4%  Diabetes:              >6.4%  Glycemic control for   <7.0% adults with diabetes   03/18/2021 11:10 AM 6.2 (H) 4.8 - 5.6 % Final    Comment:    (NOTE)         Prediabetes: 5.7 - 6.4         Diabetes: >6.4         Glycemic control for adults with diabetes: <7.0     CBG: Recent Labs  Lab 07/17/21 1030 07/17/21 1408 07/17/21 1722 07/17/21 2104 07/18/21 0725  GLUCAP 191* 181* 233* 217* 160*     Review of Systems:   Positives in BOLD: Gen: Denies fever, chills, weight change, fatigue, night sweats HEENT: Denies blurred vision, double vision, hearing loss, tinnitus, sinus congestion, rhinorrhea, sore throat, neck stiffness, dysphagia PULM: Denies shortness of breath, cough, sputum production, hemoptysis, wheezing CV: Denies chest pain, edema, orthopnea, paroxysmal nocturnal dyspnea, palpitations GI: Denies abdominal pain, nausea, vomiting, diarrhea, hematochezia, melena, constipation, change in bowel habits GU: Denies dysuria, hematuria, polyuria, oliguria, urethral discharge Endocrine: Denies hot or cold intolerance, polyuria, polyphagia or appetite change Derm: Denies rash, dry skin, scaling or peeling skin change Heme: Denies easy bruising, bleeding, bleeding  gums Neuro: Denies headache, numbness, weakness, slurred speech, loss of memory or consciousness   Past Medical History:  He,  has a past medical history of Acute on chronic heart failure ([place]), AKI (acute kidney injury) ([place]) (01/03/2018), Angina at rest [facility]) (08/13/2017), Arthritis, Asthma, Atherosclerosis, BPH (benign prostatic hyperplasia), Carotid arterial disease ([place]), Cellulitis of foot, left (09/27/2019), Charcot's joint of foot, right, Chronic combined systolic (congestive) and diastolic (congestive) heart failure ([place]), Chronic foot pain (Secondary Area of Pain) (Right) (01/29/2018), COPD (chronic obstructive pulmonary disease) ([place]), COPD with acute exacerbation ([place]) (01/30/2020), Coronary artery disease, Diabetic neuropathy ([place]), Diabetic ulcer of right foot ([place]), Hernia, HFrEF (heart failure with reduced ejection fraction) ([place]) (01/30/2020), Hyperlipidemia, Hypertension, Ischemic cardiomyopathy, Morbid obesity ([place]), Neuropathy, PAD (peripheral artery disease) ([place]), Persistent atrial fibrillation ([place]), Restless leg syndrome, Sepsis ([place]) (03/30/2018), Sleep apnea, Subclavian artery stenosis, right (HCC), Syncope and collapse, Tobacco use, Type II diabetes mellitus ([place]), Unstable angina (  [place]) (07/11/2016), and Varicose vein.   Surgical History:   Past Surgical History:  Procedure Laterality Date   ABDOMINAL AORTAGRAM N/A 06/23/2013   Procedure: ABDOMINAL Maxcine Ham;  Surgeon: Wellington Hampshire, MD;  Location: [place] CATH LAB;  Service: Cardiovascular;  Laterality: N/A;   AMPUTATION Right 04/01/2018   Procedure: AMPUTATION BELOW KNEE;  Surgeon: Algernon Huxley, MD;  Location: [facility] ORS;  Service: Vascular;  Laterality: Right;   AMPUTATION TOE Left 08/11/2020   Procedure: AMPUTATION TOE-2nd Toe;  Surgeon: Samara Deist, DPM;  Location: [facility] ORS;  Service: Podiatry;  Laterality: Left;   APPENDECTOMY     CARDIAC CATHETERIZATION  10/14   [place]; X1 STENT PROXIMAL RCA   CARDIAC CATHETERIZATION   02/2010   [facility]   CARDIAC CATHETERIZATION  02/21/2014   [facility]   CLAVICLE SURGERY     HERNIA REPAIR     IRRIGATION AND DEBRIDEMENT FOOT Right 01/04/2018   Procedure: IRRIGATION AND DEBRIDEMENT FOOT;  Surgeon: Samara Deist, DPM;  Location: [facility] ORS;  Service: Podiatry;  Laterality: Right;   LEFT HEART CATH AND CORONARY ANGIOGRAPHY N/A 01/20/2020   Procedure: LEFT HEART CATH AND CORONARY ANGIOGRAPHY;  Surgeon: Wellington Hampshire, MD;  Location: [place] CV LAB;  Service: Cardiovascular;  Laterality: N/A;   LEFT HEART CATH AND CORS/GRAFTS ANGIOGRAPHY N/A 04/28/2017   Procedure: LEFT HEART CATH AND CORONARY ANGIOGRAPHY;  Surgeon: Wellington Hampshire, MD;  Location: [place] CV LAB;  Service: Cardiovascular;  Laterality: N/A;   LOWER EXTREMITY ANGIOGRAPHY Right 03/03/2018   Procedure: LOWER EXTREMITY ANGIOGRAPHY;  Surgeon: Katha Cabal, MD;  Location: [place] CV LAB;  Service: Cardiovascular;  Laterality: Right;   LOWER EXTREMITY ANGIOGRAPHY Left 08/24/2018   Procedure: LOWER EXTREMITY ANGIOGRAPHY;  Surgeon: Algernon Huxley, MD;  Location: [place] CV LAB;  Service: Cardiovascular;  Laterality: Left;   LOWER EXTREMITY ANGIOGRAPHY Left 06/14/2019   Procedure: LOWER EXTREMITY ANGIOGRAPHY;  Surgeon: Algernon Huxley, MD;  Location: [place] CV LAB;  Service: Cardiovascular;  Laterality: Left;   LOWER EXTREMITY ANGIOGRAPHY Left 08/02/2019   Procedure: LOWER EXTREMITY ANGIOGRAPHY;  Surgeon: Algernon Huxley, MD;  Location: [place] CV LAB;  Service: Cardiovascular;  Laterality: Left;   LOWER EXTREMITY ANGIOGRAPHY Left 07/17/2020   Procedure: LOWER EXTREMITY ANGIOGRAPHY;  Surgeon: Algernon Huxley, MD;  Location: [place] CV LAB;  Service: Cardiovascular;  Laterality: Left;   PERIPHERAL ARTERIAL STENT GRAFT     x2 left/right   PERIPHERAL VASCULAR BALLOON ANGIOPLASTY Right 01/06/2018   Procedure: PERIPHERAL VASCULAR BALLOON ANGIOPLASTY;  Surgeon: Katha Cabal, MD;  Location: [place] CV LAB;  Service: Cardiovascular;  Laterality: Right;   PERIPHERAL VASCULAR CATHETERIZATION N/A 07/06/2015   Procedure: Abdominal Aortogram w/Lower Extremity;  Surgeon: Algernon Huxley, MD;  Location: [place] CV LAB;  Service: Cardiovascular;  Laterality: N/A;   PERIPHERAL VASCULAR CATHETERIZATION  07/06/2015   Procedure: Lower Extremity Intervention;  Surgeon: Algernon Huxley, MD;  Location: [place] CV LAB;  Service: Cardiovascular;;   RIGHT HEART CATH AND CORONARY ANGIOGRAPHY N/A 07/17/2021   Procedure: RIGHT HEART CATH AND CORONARY ANGIOGRAPHY;  Surgeon: Nelva Bush, MD;  Location: [place] CV LAB;  Service: Cardiovascular;  Laterality: N/A;     Social History:   reports that he has been smoking cigarettes. He has a 41.00 pack-year smoking history. He has never used smokeless tobacco. He reports that he does not drink alcohol and does not use drugs.   Family History:  His family history includes  Heart attack in his father; Heart disease in his father; Hyperlipidemia in his father and mother; Hypertension in his father and mother.   Allergies Allergies  Allergen Reactions   Dulaglutide Anaphylaxis, Diarrhea and Hives   Ace Inhibitors     hypotension   Other Itching    Skin itching associated with nitro patch   Prednisone Rash     Home Medications  Prior to Admission medications   Medication Sig Start Date End Date Taking? Authorizing Provider  albuterol (VENTOLIN HFA) 108 (90 Base) MCG/ACT inhaler Inhale into the lungs every 6 (six) hours as needed for wheezing or shortness of breath.   Yes [provider]  apixaban (ELIQUIS) 5 MG TABS tablet Take 5 mg by mouth 2 (two) times daily.   Yes [provider]  atorvastatin (LIPITOR) 80 MG tablet TAKE 1 TABLET BY MOUTH ONCE DAILY 06/12/21  Yes Gollan, Kathlene November, MD  carvedilol (COREG) 3.125 MG tablet TAKE 1 TABLET BY MOUTH TWICE DAILY WITH MEALS 06/12/21  Yes Gollan, Kathlene November, MD  clopidogrel (PLAVIX) 75  MG tablet TAKE 1 TABLET BY MOUTH ONCE DAILY 06/12/21  Yes Gollan, Kathlene November, MD  digoxin (LANOXIN) 0.125 MG tablet TAKE 1 TABLET BY MOUTH ONCE DAILY 06/12/21  Yes Gollan, Kathlene November, MD  DULoxetine (CYMBALTA) 60 MG capsule Take 60 mg by mouth daily.   Yes [provider]  ezetimibe (ZETIA) 10 MG tablet Take 1 tablet (10 mg total) by mouth daily. 10/09/20  Yes Minna Merritts, MD  gabapentin (NEURONTIN) 800 MG tablet Take 800 mg by mouth 4 (four) times daily.  08/14/17 04/04/29 Yes [provider]  ipratropium-albuterol (DUONEB) 0.5-2.5 (3) MG/3ML SOLN Inhale 3 mLs into the lungs every 6 (six) hours as needed (wheezing/sob).    Yes [provider]  losartan (COZAAR) 25 MG tablet Take 0.5 tablets (12.5 mg total) by mouth daily. 10/09/20  Yes Minna Merritts, MD  metFORMIN (GLUCOPHAGE-XR) 500 MG 24 hr tablet Take 1,000 mg by mouth 2 (two) times daily. 01/02/21  Yes [provider]  Multiple Vitamin (MULTI-VITAMINS) TABS Take 1 tablet by mouth daily.    Yes [provider]  omeprazole (PRILOSEC) 40 MG capsule Take 40 mg by mouth daily.   Yes [provider]  Oxycodone HCl 10 MG TABS Take 10 mg by mouth every 4 (four) hours as needed (pain).   Yes [provider]  polyethylene glycol (MIRALAX / GLYCOLAX) 17 g packet Take 17 g by mouth daily as needed for mild constipation. 08/12/20  Yes Wieting, Richard, MD  potassium chloride (KLOR-CON) 10 MEQ tablet TAKE 1 TABLET BY MOUTH TWICE DAILY 06/12/21  Yes Gollan, Kathlene November, MD  promethazine (PHENERGAN) 25 MG tablet Take 25 mg by mouth every 8 (eight) hours as needed. 04/02/21  Yes [provider]  spironolactone (ALDACTONE) 25 MG tablet Take 0.5 tablets (12.5 mg total) by mouth daily. 10/09/20  Yes Minna Merritts, MD  tamsulosin (FLOMAX) 0.4 MG CAPS capsule Take 1 capsule (0.4 mg total) by mouth daily after supper. 07/01/20  Yes Shawna Clamp, MD  traZODone (DESYREL) 150 MG tablet Take 150 mg by mouth  at bedtime. 07/12/19  Yes [provider]  VERQUVO 10 MG TABS TAKE 1 TABLET BY MOUTH ONCE DAILY 11/16/20  Yes Alisa Graff, FNP  Fluticasone-Umeclidin-Vilant 100-62.5-25 MCG/INH AEPB Inhale 1 puff into the lungs daily.    [provider]  nitroGLYCERIN (NITROSTAT) 0.4 MG SL tablet Place 1 tablet (0.4 mg total)  under the tongue every 5 (five) minutes as needed for chest pain. 10/09/20   Minna Merritts, MD  torsemide 40 MG TABS Take 40 mg by mouth 2 (two) times daily. Take 40 mg by mouth twice daily. 03/30/21 07/11/21  Sidney Ace, MD     Critical care provider statement:   Total critical care time: 33 minutes   Performed by: Lanney Gins MD   Critical care time was exclusive of separately billable procedures and treating other patients.   Critical care was necessary to treat or prevent imminent or life-threatening deterioration.   Critical care was time spent personally by me on the following activities: development of treatment plan with patient and/or surrogate as well as nursing, discussions with consultants, evaluation of patient's response to treatment, examination of patient, obtaining history from patient or surrogate, ordering and performing treatments and interventions, ordering and review of laboratory studies, ordering and review of radiographic studies, pulse oximetry and re-evaluation of patient's condition.    Ottie Glazier, M.D.  Pulmonary & Critical Care Medicine

## 2021-07-18 NOTE — Progress Notes (Signed)
Levophed gtt placed on hold at this time. Systolic >28, MAP >83. Will continue to monitor.

## 2021-07-18 NOTE — TOC Initial Note (Signed)
Transition of Care [facility]) - Initial/Assessment Note    Patient Details  Name: [name] [name] MRN: [id number] Date of Birth: 07/16/1960  Transition of Care [facility]) CM/SW Contact:    Shelbie Hutching, RN Phone Number: 07/18/2021, 4:25 PM  Clinical Narrative:                 Patient admitted to the hospital with Acute on Chronic HF with reduced ejection fraction.  Patient is currently in the ICU on a milrinone infusion.  Patient has a PICC line being followed by cardiology, acute O2.    RNCM met with patient at the bedside, introduced self and explained role.  Patient is from home with is long time girlfriend of 20+ years.  Patient reports being independent at home.  Patient reports he can drive but most of the time his girlfriend, [name], provides transportation.  Patient reports that he has never had home health services but he would be open to home health at discharge if needed.    TOC will follow and assist with discharge disposition.    Expected Discharge Plan: [place] Barriers to Discharge: Continued Medical Work up   Patient Goals and CMS Choice Patient states their goals for this hospitalization and ongoing recovery are:: patient wants his legs to get better      Expected Discharge Plan and Services Expected Discharge Plan: [place]   Discharge Planning Services: CM Consult   Living arrangements for the past 2 months: Single Family Home                                      Prior Living Arrangements/Services Living arrangements for the past 2 months: Single Family Home Lives with:: Significant Other Patient language and need for interpreter reviewed:: Yes Do you feel safe going back to the place where you live?: Yes      Need for Family Participation in Patient Care: Yes (Comment) Care giver support system in place?: Yes (comment)   Criminal Activity/Legal Involvement Pertinent to Current Situation/Hospitalization: No -  Comment as needed  Activities of Daily Living Home Assistive Devices/Equipment: Wheelchair (electric wheelchair) ADL Screening (condition at time of admission) Patient's cognitive ability adequate to safely complete daily activities?: Yes Is the patient deaf or have difficulty hearing?: No Does the patient have difficulty seeing, even when wearing glasses/contacts?: No Does the patient have difficulty concentrating, remembering, or making decisions?: No Patient able to express need for assistance with ADLs?: Yes Does the patient have difficulty dressing or bathing?: Yes Independently performs ADLs?: Yes (appropriate for developmental age) Does the patient have difficulty walking or climbing stairs?: Yes Weakness of Legs: Left Weakness of Arms/Hands: Both  Permission Sought/Granted Permission sought to share information with : Case Manager, Family Supports Permission granted to share information with : Yes, Verbal Permission Granted  Share Information with NAME: Pamella Pert     Permission granted to share info w Relationship: significant other  Permission granted to share info w Contact Information: [phone]  Emotional Assessment Appearance:: Appears older than stated age Attitude/Demeanor/Rapport: Engaged Affect (typically observed): Accepting Orientation: : Oriented to Self, Oriented to Place, Oriented to  Time, Oriented to Situation Alcohol / Substance Use: Not Applicable Psych Involvement: No (comment)  Admission diagnosis:  COPD exacerbation (HCC) [J44.1] Acute on chronic systolic congestive heart failure (HCC) [I50.23] Acute on chronic HFrEF (heart failure with reduced ejection  fraction) [facility]) [I50.23] Patient Active Problem List   Diagnosis Date Noted   Prolonged QT interval 07/15/2021   Acute on chronic combined systolic and diastolic CHF (congestive heart failure) ([place]) 06/14/2021   Permanent atrial fibrillation (HCC)    Acute on chronic HFrEF (heart  failure with reduced ejection fraction) ([place]) 03/26/2021   Cardiogenic shock ([place]) 03/26/2021   Thrombocytopenia ([place]) 03/21/2021   Cellulitis of left lower extremity 03/18/2021   Chronic ulcer of left leg ([place]) 03/18/2021   Hypotension 03/18/2021   BPH (benign prostatic hyperplasia)    Depression    Stage 3a chronic kidney disease (CKD) (HCC)    Elevated troponin    Acquired absence of right leg below knee ([place]) 08/22/2020   Pyogenic inflammation of bone ([place])    Chronic a-fib ([place])    Demand ischemia ([place])    Palliative care by specialist    CHF (congestive heart failure) ([place]) 06/18/2020   Chronic anticoagulation 06/17/2020   Chronic venous insufficiency 02/10/2020   COPD with acute exacerbation ([place]) 01/30/2020   Persistent atrial fibrillation ([place]) 01/19/2020   Chronic bilateral low back pain with sciatica 10/08/2019   Medication management contract signed 10/08/2019   Hyponatremia 09/27/2019   CAD (coronary artery disease) 08/31/2019   Acute on chronic systolic CHF (congestive heart failure) ([place]) 11/05/2018   Open toe wound 10/01/2018   Venous ulcer of left leg ([place]) 08/19/2018   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[place]) 04/28/2018   Abnormal MRI, shoulder (Right) 02/23/2018   Abnormal MRI, cervical spine (2016) 02/23/2018   DDD (degenerative disc disease), cervical 02/23/2018   Cervical foraminal stenosis (Bilateral) 02/23/2018   Cervical central spinal stenosis 02/23/2018   Cervical facet hypertrophy 02/23/2018   Arthralgia of acromioclavicular joint (Right) 02/23/2018   Osteoarthritis of  AC (acromioclavicular) joint (Right) 02/23/2018   Biceps tendinosis of shoulder (Right) 02/23/2018   Tendinopathy of rotator cuff (Right) 02/23/2018   Vitamin D deficiency 02/23/2018   Atherosclerosis of native arteries of the extremities with ulceration ([place]) 02/15/2018   Peripheral edema 02/05/2018   Status post peripherally inserted central catheter (PICC) central  line placement 02/04/2018   Chronic lower extremity pain (Primary Area of Pain) (Right) 01/29/2018   Chronic shoulder pain (Tertiary Area of Pain) (Right) 01/29/2018   Chronic pain syndrome 01/29/2018   Long term current use of opiate analgesic 01/29/2018   Medication monitoring encounter 01/29/2018   Disorder of skeletal system 01/29/2018   Problems influencing health status 01/29/2018   AKI (acute kidney injury) ([place]) 01/03/2018   NSTEMI (non-ST elevated myocardial infarction) ([place]) 11/21/2017   HLD (hyperlipidemia) 09/10/2017   Angina pectoris ([place]) 09/08/2017   Lymphedema 79/07/4095   Chronic systolic CHF (congestive heart failure) ([place]) 04/23/2017   Chest pain 07/11/2016   Foot ulcer (Right)    Stable angina (HCC)    Pressure injury of skin 05/06/2016   Diabetic foot infection ([place]) 05/05/2016   Type 2 diabetes mellitus with hyperlipidemia ([place]) 01/26/2016   Cellulitis 11/21/2015   Morbid obesity ([place]) 11/17/2014   PAD (peripheral artery disease) ([place])    Hypertension    Tobacco use    COPD (chronic obstructive pulmonary disease) ([place]) 05/19/2012   Diabetic neuropathy ([place]) 05/19/2012   PVD (peripheral vascular disease) ([place]) 03/27/2012   PCP:  [name], MD Pharmacy:   [facility], [address] [place] [address] Phone: [phone] Fax: [place]  [place] [address] Phone: [phone] Fax: [phone]     Social Determinants of Health (SDOH) Interventions    Readmission Risk Interventions Readmission Risk Prevention Plan 07/18/2021 06/16/2021 03/20/2021  Transportation Screening Complete Complete Complete  HRI or [place] for [place] consult not completed comments - - -  Palliative Care Screening - - -  Medication Review Press photographer) Complete Complete Complete  PCP or  Specialist appointment within 3-5 days of discharge Complete Complete Complete  HRI or Home Care Consult Complete Complete Complete  SW Recovery Care/Counseling Consult Complete Complete Complete  Palliative Care Screening Not Applicable Not Applicable Not [place] Not Applicable Not Applicable Not Applicable  Some recent data might be hidden

## 2021-07-18 NOTE — Progress Notes (Signed)
[place] [facility] [place] NAME: [name] [name]    MR#:  [id number]  DATE OF BIRTH:  11/01/1960  SUBJECTIVE:   patient transferred to the ICU for IV milrinone gtt good urine output. Shortness of breath improving  family at bedside.   VITALS:  Blood pressure 122/74, pulse 87, temperature 98.2 F (36.8 C), temperature source Oral, resp. rate 13, height 5\' 7"  (1.702 m), weight 93.7 kg, SpO2 100 %.  PHYSICAL EXAMINATION:   GENERAL:  61 y.o.-year-old patient lying in the bed with no acute distress. Chronically ill LUNGS: Normal breath sounds bilaterally, no wheezing, rales, rhonchi.  CARDIOVASCULAR: S1, S2 normal. No murmurs, rubs, or gallops.  ABDOMEN: Soft, nontender, nondistended. Bowel sounds present.  EXTREMITIES: right below knee amputation. Left ankle chronic ulcer NEUROLOGIC: nonfocal patient is alert and awake   LABORATORY PANEL:  CBC Recent Labs  Lab 07/18/21 0526  WBC 6.5  HGB 8.3*  HCT 25.9*  PLT 121*    Chemistries  Recent Labs  Lab 07/16/21 0432 07/17/21 0603 07/18/21 0526  NA 126*   < > 120*  K 5.2*   < > 4.7  CL 92*   < > 84*  CO2 26   < > 29  GLUCOSE 127*   < > 219*  BUN 45*   < > 64*  CREATININE 1.71*   < > 1.78*  CALCIUM 8.6*   < > 8.5*  MG 2.3   < > 2.3  AST 25  --   --   ALT 14  --   --   ALKPHOS 108  --   --   BILITOT 2.3*  --   --    < > = values in this interval not displayed.   Cardiac Enzymes No results for input(s): TROPONINI in the last 168 hours. RADIOLOGY:  CARDIAC CATHETERIZATION  Result Date: 07/17/2021 Conclusions: Severe multivessel coronary artery disease, similar to prior catheterization from 01/2020. Severely elevated left heart, right heart, and pulmonary artery pressures. Moderately-severely reduced Fick cardiac output/index. Recommendations: Medical management of stable severe coronary artery disease. Transfer to ICU for initiation of milrinone.  PICC line to be placed by IV team for  infusion of vasoactive medications as well as hemodynamic monitoring. Discontinue vericiguat due to soft BP and continue to hold carvedilol, losartan, and spironolactone.  Addition of norepinephrine may need to be considered if hypotension worsens. Restart furosemide at 80 mg IV BID (first dose given during catheterization).  Further dose escalation may be necessary based on response to furosemide and milrinone. Restart apixaban 5 mg BID tonight if no evidence of bleeding/vascular injury at catheterization sites. Nelva Bush, MD [facility]  DG Chest Port 1 View  Result Date: 07/17/2021 CLINICAL DATA:  PICC central catheter line placement EXAM: PORTABLE CHEST 1 VIEW COMPARISON:  07/15/2021 05/27/2021 FINDINGS: Cardiac silhouette is again moderately to markedly enlarged. Calcification is again seen within the aortic arch. New left upper extremity PICC tip overlies the superior vena cava/right atrial junction. The right costophrenic angle is not imaged. Unchanged mild bilateral interstitial thickening. There is mild cephalization of the pulmonary vasculature, unchanged. No pleural effusion is seen. No pneumothorax. No acute skeletal abnormality. IMPRESSION: No significant change from 07/15/2021. Possible mild interstitial pulmonary edema. Moderately to markedly enlarged cardiac silhouette is unchanged from prior. Electronically Signed   By: Yvonne Kendall M.D.   On: 07/17/2021 17:41   [place] EKG SITE RITE  Result Date: 07/17/2021 If [facility] image not  attached, placement could not be confirmed due to current cardiac rhythm.   Assessment and Plan [name] [name] is a 61 y.o. male with medical history significant for HFrEF, CAD, a-fib, COPD, HTN, DMT2, OSA, tobacco use who presented for evaluation of shortness of breath and swelling.     Acute on chronic HFrEF (heart failure with reduced ejection fraction --LVEF<20%.  Patient does not have an AICD. --BNP chronically elevated.  CXR showed Mild pulmonary  vascular congestion. No evidence of pulmonary Edema. --Cr worsened with just 1 dose of IV lasix 40 --right and left heart cath 07/17/2021 found to have severely elevated left and right heart filling pressures and severely reduced cardiac output.   -cont iV milrinone and pressor for low BP.   --2/8 --IV lasix 80 mg BID started per [facility]   Acute COPD exacerbation --dyspnea contributed by COPD exacerbation, since pt has significant wheezing, cough, and smokes 2 packs per day.  Does not appear to have PNA. --received IV solumedrol 125 mg on admission --cont steroid as prednisone 40 mg daily --DuoNeb scheduled    AKI (acute kidney injury) 2/2 Cardiorenal syndrome --worsened with just 1 dose of IV lasix 40.  Likely due to cardio-renal syndrome --diuretic per cardiology    Type 2 diabetes mellitus with hyperlipidemia  Hyperglycemia exacerbated by steroid use Last HgbA1c was 6.1 on 06/17/21 --resume home metformin after discharge --SSI for now      Hypotension Pt with soft BP in ER and hx of cardiogenic shock in past. Was given dose of midodrine in ER.  --transfer to ICU for pressor, per cardio rec     Permanent atrial fibrillation  --hold digoxin due to AKI --cont Eliquis     Hyponatremia --Na 123 on presentation, worsening, likely due to hypervolemia --diuresis per cardio     PAD (peripheral artery disease) Chronic. --cont statin and Zetia --cont plavix     Tobacco use Smokes 2 ppd.  --cont nicotine patch     Chronic LLE wounds --podiatry consulted and did bed-side I/D --wound care per order     DVT prophylaxis: WE:[id number] Code Status: Full code  Family Communication: significant other updated at bedside today   Level of care: ICU Dispo:   The patient is from: home Anticipated d/c is to: home Anticipated d/c date is: > 3 days Patient currently is not medically ready to d/c due to: significant dyspnea, in ICU on milrinone gtt               TOTAL  TIME TAKING CARE OF THIS PATIENT: 30 minutes.  >50% time spent on counselling and coordination of care  Note: This dictation was prepared with Dragon dictation along with smaller phrase technology. Any transcriptional errors that result from this process are unintentional.  Fritzi Mandes M.D    [facility]   CC: Primary care physician; [name], MD

## 2021-07-19 DIAGNOSIS — I739 Peripheral vascular disease, unspecified: Secondary | ICD-10-CM

## 2021-07-19 DIAGNOSIS — I5023 Acute on chronic systolic (congestive) heart failure: Secondary | ICD-10-CM | POA: Diagnosis not present

## 2021-07-19 DIAGNOSIS — E785 Hyperlipidemia, unspecified: Secondary | ICD-10-CM

## 2021-07-19 DIAGNOSIS — I255 Ischemic cardiomyopathy: Secondary | ICD-10-CM

## 2021-07-19 DIAGNOSIS — E871 Hypo-osmolality and hyponatremia: Secondary | ICD-10-CM

## 2021-07-19 DIAGNOSIS — E1169 Type 2 diabetes mellitus with other specified complication: Secondary | ICD-10-CM

## 2021-07-19 DIAGNOSIS — I25118 Atherosclerotic heart disease of native coronary artery with other forms of angina pectoris: Secondary | ICD-10-CM | POA: Diagnosis not present

## 2021-07-19 DIAGNOSIS — Z720 Tobacco use: Secondary | ICD-10-CM

## 2021-07-19 DIAGNOSIS — J441 Chronic obstructive pulmonary disease with (acute) exacerbation: Secondary | ICD-10-CM | POA: Diagnosis not present

## 2021-07-19 DIAGNOSIS — I9589 Other hypotension: Secondary | ICD-10-CM

## 2021-07-19 DIAGNOSIS — Z9114 Patient's other noncompliance with medication regimen: Secondary | ICD-10-CM

## 2021-07-19 LAB — BASIC METABOLIC PANEL
Anion gap: 9 (ref 5–15)
BUN: 56 mg/dL — ABNORMAL HIGH (ref 6–20)
CO2: 31 mmol/L (ref 22–32)
Calcium: 8.7 mg/dL — ABNORMAL LOW (ref 8.9–10.3)
Chloride: 82 mmol/L — ABNORMAL LOW (ref 98–111)
Creatinine, Ser: 1.49 mg/dL — ABNORMAL HIGH (ref 0.61–1.24)
GFR, Estimated: 54 mL/min — ABNORMAL LOW (ref >60)
Glucose, Bld: 182 mg/dL — ABNORMAL HIGH (ref 70–99)
Potassium: 4.2 mmol/L (ref 3.5–5.1)
Sodium: 122 mmol/L — ABNORMAL LOW (ref 135–145)

## 2021-07-19 LAB — RETICULOCYTES
Immature Retic Fract: 29.5 % — ABNORMAL HIGH (ref 2.3–15.9)
RBC.: 3.29 MIL/uL — ABNORMAL LOW (ref 4.22–5.81)
Retic Count, Absolute: 75.7 10*3/uL (ref 19.0–186.0)
Retic Ct Pct: 2.3 % (ref 0.4–3.1)

## 2021-07-19 LAB — GLUCOSE, CAPILLARY
Glucose-Capillary: 164 mg/dL — ABNORMAL HIGH (ref 70–99)
Glucose-Capillary: 167 mg/dL — ABNORMAL HIGH (ref 70–99)
Glucose-Capillary: 166 mg/dL — ABNORMAL HIGH (ref 70–99)
Glucose-Capillary: 137 mg/dL — ABNORMAL HIGH (ref 70–99)

## 2021-07-19 LAB — IRON AND TIBC
Iron: 20 ug/dL — ABNORMAL LOW (ref 45–182)
Saturation Ratios: 4 % — ABNORMAL LOW (ref 17.9–39.5)
TIBC: 452 ug/dL — ABNORMAL HIGH (ref 250–450)
UIBC: 432 ug/dL

## 2021-07-19 LAB — CBC
HCT: 25 % — ABNORMAL LOW (ref 39.0–52.0)
Hemoglobin: 8.2 g/dL — ABNORMAL LOW (ref 13.0–17.0)
MCH: 24.7 pg — ABNORMAL LOW (ref 26.0–34.0)
MCHC: 32.8 g/dL (ref 30.0–36.0)
MCV: 75.3 fL — ABNORMAL LOW (ref 80.0–100.0)
Platelets: 128 10*3/uL — ABNORMAL LOW (ref 150–400)
RBC: 3.32 MIL/uL — ABNORMAL LOW (ref 4.22–5.81)
RDW: 19.8 % — ABNORMAL HIGH (ref 11.5–15.5)
WBC: 6 10*3/uL (ref 4.0–10.5)
nRBC: 0 % (ref 0.0–0.2)

## 2021-07-19 LAB — FERRITIN: Ferritin: 38 ng/mL (ref 24–336)

## 2021-07-19 LAB — FOLATE: Folate: 11.6 ng/mL (ref >5.9)

## 2021-07-19 LAB — VITAMIN B12: Vitamin B-12: 541 pg/mL (ref 180–914)

## 2021-07-19 LAB — MAGNESIUM: Magnesium: 2.3 mg/dL (ref 1.7–2.4)

## 2021-07-19 MED ORDER — SODIUM CHLORIDE 0.9 % IV SOLN
200.0000 mg | Freq: Once | INTRAVENOUS | Status: AC
  Administered 2021-07-19: 200 mg via INTRAVENOUS
  Filled 2021-07-19: qty 200

## 2021-07-19 MED ORDER — PROCHLORPERAZINE EDISYLATE 10 MG/2ML IJ SOLN
5.0000 mg | Freq: Four times a day (QID) | INTRAMUSCULAR | Status: DC | PRN
  Filled 2021-07-19: qty 1

## 2021-07-19 NOTE — Plan of Care (Addendum)
PMT note:  Patient is A&O at this time. He states his siblings are dead. He has no children. He has a girlfriend of 20 years. She has a child and grandchildren. He states he does not have an HPOA and would like her to be his surrogate decision maker. Requested chaplain directly in ICU to complete HPOA papers with him.   Will follow up tomorrow for [place].

## 2021-07-19 NOTE — Progress Notes (Signed)
D/c will depend when pt weaned off IV milrinonegtt per cardiology

## 2021-07-19 NOTE — Progress Notes (Signed)
Progress Note  Patient Name: [name] [name] Date of Encounter: 07/19/2021  Primary Cardiologist: Rockey Situ  Subjective   Does not feel well today. Dyspnea unchanged. Drinking large amounts of liquid. Remains on milrinone gtt. Documented UOP 659 mL for the past 24 hours, net - 1.4 L for the admission.  No UOP following IV Lasix this morning. No chest pain.   Inpatient Medications    Scheduled Meds:  apixaban  5 mg Oral BID   atorvastatin  80 mg Oral Daily   Chlorhexidine Gluconate Cloth  6 each Topical Daily   clopidogrel  75 mg Oral Daily   docusate sodium  100 mg Oral Daily   DULoxetine  60 mg Oral Daily   ezetimibe  10 mg Oral Daily   fluticasone furoate-vilanterol  1 puff Inhalation Daily   And   umeclidinium bromide  1 puff Inhalation Daily   furosemide  80 mg Intravenous BID   gabapentin  800 mg Oral QID   insulin aspart  0-15 Units Subcutaneous TID WC   insulin aspart  0-5 Units Subcutaneous QHS   ipratropium-albuterol  3 mL Nebulization QID   multivitamin with minerals  1 tablet Oral Daily   nicotine  21 mg Transdermal Daily   pantoprazole  40 mg Oral Daily   sodium chloride flush  10-40 mL Intracatheter Q12H   tamsulosin  0.4 mg Oral QPC supper   Continuous Infusions:  sodium chloride Stopped (07/17/21 1949)   milrinone 0.125 mcg/kg/min (07/19/21 0800)   PRN Meds: acetaminophen **OR** acetaminophen, albuterol, ipratropium-albuterol, nitroGLYCERIN, oxyCODONE, prochlorperazine, senna-docusate   Vital Signs    Vitals:   07/19/21 0715 07/19/21 0730 07/19/21 0745 07/19/21 0800  BP: (!) 147/69 118/75 132/77 115/67  Pulse: (!) 106 (!) 33  (!) 50  Resp: (!) 21 17 14 15   Temp:    (!) 96.2 F (35.7 C)  TempSrc:    Oral  SpO2: (!) 86% 94%  100%  Weight:      Height:        Intake/Output Summary (Last 24 hours) at 07/19/2021 0934 Last data filed at 07/19/2021 0800 Gross per 24 hour  Intake 2140.3 ml  Output 2275 ml  Net -134.7 ml   Filed Weights   07/17/21  1400 07/18/21 0500 07/19/21 0500  Weight: 96.2 kg 93.7 kg 96.5 kg    Telemetry    Afib with PVCs - Personally Reviewed  ECG    No new tracings - Personally Reviewed  Physical Exam   GEN: No acute distress.   Neck: JVD elevated to the angle of the mandible. Cardiac: IRIR with occasional ectopy, no murmurs, rubs, or gallops.  Respiratory: Diminished and coarse breath sounds bilaterally.  GI: Soft, nontender, distended.   MS: 1+ edema with left foot in pressure boot; No deformity. Neuro:  Alert and oriented x 3; Nonfocal.  Psych: Normal affect.  Labs    Chemistry Recent Labs  Lab 07/15/21 1640 07/16/21 0432 07/17/21 0603 07/17/21 1811 07/18/21 0526 07/19/21 0526  NA 123* 126*   < > 121* 120* 122*  K 5.1 5.2*   < > 5.4* 4.7 4.2  CL 90* 92*   < > 86* 84* 82*  CO2 26 26   < > 26 29 31   GLUCOSE 98 127*   < > 252* 219* 182*  BUN 39* 45*   < > 65* 64* 56*  CREATININE 1.55* 1.71*   < > 2.06* 1.78* 1.49*  CALCIUM 8.1* 8.6*   < >  8.7* 8.5* 8.7*  PROT 6.5 6.1*  --   --   --   --   ALBUMIN 3.2* 3.3*  --   --   --   --   AST 32 25  --   --   --   --   ALT 14 14  --   --   --   --   ALKPHOS 108 108  --   --   --   --   BILITOT 2.9* 2.3*  --   --   --   --   GFRNONAA 52* 46*   < > 37* 44* 54*  ANIONGAP 7 8   < > 9 7 9    < > = values in this interval not displayed.     Hematology Recent Labs  Lab 07/17/21 0603 07/18/21 0526 07/19/21 0526  WBC 8.2 6.5 6.0  RBC 3.53* 3.31* 3.32*  HGB 9.1* 8.3* 8.2*  HCT 27.8* 25.9* 25.0*  MCV 78.8* 78.2* 75.3*  MCH 25.8* 25.1* 24.7*  MCHC 32.7 32.0 32.8  RDW 19.9* 19.9* 19.8*  PLT 184 121* 128*    Cardiac EnzymesNo results for input(s): TROPONINI in the last 168 hours. No results for input(s): TROPIPOC in the last 168 hours.   BNP Recent Labs  Lab 07/15/21 1640  BNP 1,987.6*     DDimer No results for input(s): DDIMER in the last 168 hours.   Radiology    DG Chest Port 1 View  Result Date: 07/17/2021 IMPRESSION: No  significant change from 07/15/2021. Possible mild interstitial pulmonary edema. Moderately to markedly enlarged cardiac silhouette is unchanged from prior. Electronically Signed   By: Yvonne Kendall M.D.   On: 07/17/2021 17:41   Cardiac Studies   [facility] 07/17/2021: Conclusions: Severe multivessel coronary artery disease, similar to prior catheterization from 01/2020. Severely elevated left heart, right heart, and pulmonary artery pressures. Moderately-severely reduced Fick cardiac output/index.   Recommendations: Medical management of stable severe coronary artery disease. Transfer to ICU for initiation of milrinone.  PICC line to be placed by IV team for infusion of vasoactive medications as well as hemodynamic monitoring. Discontinue vericiguat due to soft BP and continue to hold carvedilol, losartan, and spironolactone.  Addition of norepinephrine may need to be considered if hypotension worsens. Restart furosemide at 80 mg IV BID (first dose given during catheterization).  Further dose escalation may be necessary based on response to furosemide and milrinone. Restart apixaban 5 mg BID tonight if no evidence of bleeding/vascular injury at catheterization sites. __________  2D echo 06/15/2021: 1. Left ventricular ejection fraction, by estimation, is <20%. The left  ventricle has severely decreased function. The left ventricle demonstrates  global hypokinesis. The left ventricular internal cavity size was severely  dilated. Left ventricular  diastolic parameters are indeterminate.   2. Right ventricular systolic function is severely reduced. The right  ventricular size is moderately enlarged. There is severely elevated  pulmonary artery systolic pressure. The estimated right ventricular  systolic pressure is 06.2 mmHg.   3. The mitral valve is normal in structure. No evidence of mitral valve  regurgitation. No evidence of mitral stenosis.   4. The aortic valve is normal in structure. Aortic  valve regurgitation is  not visualized. Aortic valve sclerosis/calcification is present, without  any evidence of aortic stenosis.   5. The inferior vena cava is dilated in size with <50% respiratory  variability, suggesting right atrial pressure of 15 mmHg.   Patient Profile     61  y.o. male with history of CAD s/p prior PCI, chronic combined systolic and diastolic congestive heart failure, ICM, pulmonary hypertension, tobacco abuse, COPD, HTN, HLD, permanent atrial fibrillation, obesity, obstructive sleep apnea, type 2 diabetes mellitus, stage III chronic kidney disease, peripheral arterial disease status post right BKA, and carotid arterial disease, who is being seen today for the evaluation of low output CHF.  Assessment & Plan    1. Acute on chronic low output HFrEF with pulmonary hypertension/ICM: -He remains significantly volume up -Despite augmentation of diuresis with milrinone gtt, he has not had significant UOP -Continue milrinone  -Question if a paracentesis could be undertaken -MD considering Lasix gtt with metolazone -Relative hypotension precludes escalation of GDMT -Overall, poor prognosis, palliative care on board   2. CAD involving the native coronary arteries with elevated troponin: -No chest pain -LHC this admission as above with medical management of severe stable CAD -Plavix -Lipitor -Zetia  3. Permanent Afib: -Ventricular rates reasonably controlled, not on AV nodal blocking medications -CHADS2VASc at least 4 -Eliquis  4. PAD: -Followed by vascular surgery -PTA Plavix      For questions or updates, please contact [facility] Please consult [email or web address] for contact info under Cardiology/STEMI.    Signed, Christell Faith, PA-C [place] Pager: [phone] 07/19/2021, 9:34 AM

## 2021-07-19 NOTE — Progress Notes (Signed)
Summary: Patient awake all night, reported to sleep during the day by patient. Patient increasingly confused/delirious throughout the night, attempting to drink from call Jalil Lorusso and smoke from straw, removing monitoring equipment and oxygen. Patient has excessive oral intake, education provided. Excessive ectopy and 2 sustained runs of Vtach (8, 12 beats). Milrinone infusing at 0.137mcg. Cardiology notified.

## 2021-07-19 NOTE — Progress Notes (Signed)
°   07/19/21 1600  Clinical Encounter Type  Visited With Patient  Visit Type Social support  Referral From Palliative care team   Palliative Care asked Chaplain to facilitate the signing of the POA. Chaplain found the patient a little confused and also, the patient kept deferring to a time when his girlfriend was present. Chaplain team will be ready to assist when girlfriend arrives and if patient is alert.

## 2021-07-19 NOTE — Progress Notes (Signed)
[name] NAME: [name] [name]    MR#:  [id number]  DATE OF BIRTH:  25-Jan-1961  SUBJECTIVE:   patient transferred to the ICU for IV milrinone gtt good urine output overall yday--so far today 400 cc Shortness of breath improving  No family at bedside.   VITALS:  Blood pressure 105/80, pulse (!) 104, temperature (!) 96.8 F (36 C), temperature source Axillary, resp. rate 15, height 5\' 7"  (1.702 m), weight 96.5 kg, SpO2 91 %.  PHYSICAL EXAMINATION:   GENERAL:  61 y.o.-year-old patient lying in the bed with no acute distress. Chronically ill LUNGS: decreased breath sounds bilaterally, no wheezing, rales, rhonchi.  CARDIOVASCULAR: S1, S2 normal. No murmurs, rubs, or gallops.  ABDOMEN: Soft, nontender, nondistended. Bowel sounds present.  EXTREMITIES: right below knee amputation. Left ankle chronic ulcer NEUROLOGIC: nonfocal patient is alert and awake   LABORATORY PANEL:  CBC Recent Labs  Lab 07/19/21 0526  WBC 6.0  HGB 8.2*  HCT 25.0*  PLT 128*     Chemistries  Recent Labs  Lab 07/16/21 0432 07/17/21 0603 07/19/21 0526  NA 126*   < > 122*  K 5.2*   < > 4.2  CL 92*   < > 82*  CO2 26   < > 31  GLUCOSE 127*   < > 182*  BUN 45*   < > 56*  CREATININE 1.71*   < > 1.49*  CALCIUM 8.6*   < > 8.7*  MG 2.3   < > 2.3  AST 25  --   --   ALT 14  --   --   ALKPHOS 108  --   --   BILITOT 2.3*  --   --    < > = values in this interval not displayed.    Cardiac Enzymes No results for input(s): TROPONINI in the last 168 hours. RADIOLOGY:  DG Chest Port 1 View  Result Date: 07/17/2021 CLINICAL DATA:  PICC central catheter line placement EXAM: PORTABLE CHEST 1 VIEW COMPARISON:  07/15/2021 05/27/2021 FINDINGS: Cardiac silhouette is again moderately to markedly enlarged. Calcification is again seen within the aortic arch. New left upper extremity PICC tip overlies the superior vena cava/right atrial junction. The right  costophrenic angle is not imaged. Unchanged mild bilateral interstitial thickening. There is mild cephalization of the pulmonary vasculature, unchanged. No pleural effusion is seen. No pneumothorax. No acute skeletal abnormality. IMPRESSION: No significant change from 07/15/2021. Possible mild interstitial pulmonary edema. Moderately to markedly enlarged cardiac silhouette is unchanged from prior. Electronically Signed   By: Yvonne Kendall M.D.   On: 07/17/2021 17:41   [place] EKG SITE RITE  Result Date: 07/17/2021 If [facility] image not attached, placement could not be confirmed due to current cardiac rhythm.   Assessment and Plan [name] [name] is a 61 y.o. male with medical history significant for HFrEF, CAD, a-fib, COPD, HTN, DMT2, OSA, tobacco use who presented for evaluation of shortness of breath and swelling.     Acute on chronic HFrEF (heart failure with reduced ejection fraction --LVEF<20%.  Patient does not have an AICD. --BNP chronically elevated.  CXR showed Mild pulmonary vascular congestion. No evidence of pulmonary Edema. --Cr worsened with just 1 dose of IV lasix 40 --right and left heart cath 07/17/2021 found to have severely elevated left and right heart filling pressures and severely reduced cardiac output.   -cont iV milrinone and pressor for low BP.   --2/8 --  IV lasix 80 mg BID started per [facility] cardiology --2/9--cont iv milrinone. UOP 3liters 2/8. Card considering IV lasix gtt with metolazone --sats 90-93% on RA -- per cardiology not a candidate for AICD, h/o  noncompliance   Acute COPD exacerbation --dyspnea contributed by COPD exacerbation, since pt has significant wheezing, cough, and smokes 2 packs per day.  Does not appear to have PNA. --received IV solumedrol 125 mg on admission --cont steroid as prednisone 40 mg daily--complted 4 dasy --DuoNeb scheduled    AKI (acute kidney injury) 2/2 Cardiorenal syndrome --worsened with just 1 dose of IV lasix 40.  Likely due to  cardio-renal syndrome --diuretic per cardiology --crea improving 1.49    Type 2 diabetes mellitus with hyperlipidemia  Hyperglycemia exacerbated by steroid use Last HgbA1c was 6.1 on 06/17/21 --SSI for now      Hypotension Pt with soft BP in ER and hx of cardiogenic shock in past. Was given dose of midodrine in ER.  --transfer to ICU for pressor, per cardio rec     Permanent atrial fibrillation  --hold digoxin due to AKI --cont Eliquis     Hyponatremia --Na 123 on presentation, worsening, likely due to hypervolemia --diuresis per cardio     PAD (peripheral artery disease) Chronic. --cont statin and Zetia --cont plavix     Tobacco use Smokes 2 ppd.  --cont nicotine patch     Chronic LLE wounds --podiatry consulted and did bed-side I/D --wound care per order     DVT prophylaxis: ER:[id number] Code Status: Full code  Family Communication: significant other updated at bedside today   Level of care: ICU Dispo:   The patient is from: home Anticipated d/c is to: home Anticipated d/c date is: > 3 days Patient currently is not medically ready to d/c due to: significant dyspnea, in ICU on milrinone gtt   patient overall has a poor prognosis. He has significant comorbidities high risk for cardiorespiratory arrest. Will get palliative care consultation.   TOTAL TIME TAKING CARE OF THIS PATIENT: 28minutes.  >50% time spent on counselling and coordination of care  Note: This dictation was prepared with Dragon dictation along with smaller phrase technology. Any transcriptional errors that result from this process are unintentional.  Fritzi Mandes M.D    [facility]   CC: Primary care physician; [name], MD

## 2021-07-20 DIAGNOSIS — Z7189 Other specified counseling: Secondary | ICD-10-CM

## 2021-07-20 DIAGNOSIS — I5023 Acute on chronic systolic (congestive) heart failure: Secondary | ICD-10-CM | POA: Diagnosis not present

## 2021-07-20 LAB — BASIC METABOLIC PANEL
Anion gap: 10 (ref 5–15)
BUN: 53 mg/dL — ABNORMAL HIGH (ref 6–20)
CO2: 32 mmol/L (ref 22–32)
Calcium: 9 mg/dL (ref 8.9–10.3)
Chloride: 82 mmol/L — ABNORMAL LOW (ref 98–111)
Creatinine, Ser: 1.27 mg/dL — ABNORMAL HIGH (ref 0.61–1.24)
GFR, Estimated: >60 mL/min (ref >60)
Glucose, Bld: 154 mg/dL — ABNORMAL HIGH (ref 70–99)
Potassium: 3.8 mmol/L (ref 3.5–5.1)
Sodium: 124 mmol/L — ABNORMAL LOW (ref 135–145)

## 2021-07-20 LAB — CBC
HCT: 26.6 % — ABNORMAL LOW (ref 39.0–52.0)
Hemoglobin: 8.7 g/dL — ABNORMAL LOW (ref 13.0–17.0)
MCH: 24.6 pg — ABNORMAL LOW (ref 26.0–34.0)
MCHC: 32.7 g/dL (ref 30.0–36.0)
MCV: 75.1 fL — ABNORMAL LOW (ref 80.0–100.0)
Platelets: 131 10*3/uL — ABNORMAL LOW (ref 150–400)
RBC: 3.54 MIL/uL — ABNORMAL LOW (ref 4.22–5.81)
RDW: 19.5 % — ABNORMAL HIGH (ref 11.5–15.5)
WBC: 7 10*3/uL (ref 4.0–10.5)
nRBC: 0.3 % — ABNORMAL HIGH (ref 0.0–0.2)

## 2021-07-20 LAB — COOXEMETRY PANEL
Carboxyhemoglobin: 1.8 % — ABNORMAL HIGH (ref 0.5–1.5)
Methemoglobin: 0 % (ref 0.0–1.5)
O2 Saturation: 39.2 %
Total oxygen content: 38.5 mL/dL

## 2021-07-20 LAB — MAGNESIUM: Magnesium: 2 mg/dL (ref 1.7–2.4)

## 2021-07-20 LAB — GLUCOSE, CAPILLARY
Glucose-Capillary: 150 mg/dL — ABNORMAL HIGH (ref 70–99)
Glucose-Capillary: 151 mg/dL — ABNORMAL HIGH (ref 70–99)
Glucose-Capillary: 97 mg/dL (ref 70–99)
Glucose-Capillary: 86 mg/dL (ref 70–99)

## 2021-07-20 MED ORDER — METOLAZONE 5 MG PO TABS
5.0000 mg | ORAL_TABLET | Freq: Once | ORAL | Status: AC
  Administered 2021-07-20: 5 mg via ORAL
  Filled 2021-07-20: qty 1

## 2021-07-20 NOTE — Progress Notes (Signed)
NAME:  [name] [name], MRN:  [id number], DOB:  11-02-1960, LOS: 5 ADMISSION DATE:  07/15/2021, CONSULTATION DATE:  07/17/2021 REFERRING MD:  Dr. Billie Ruddy, CHIEF COMPLAINT:  Shortness of Breath & Edema   Brief Pt Description / Synopsis:  61 y.o. Male admitted with Acute on Chronic HFrEF (LVEF <20%) with Cardiogenic shock and concern for developing Cardiorenal Syndrome.  Being placed on Milrinone and Levophed infusions.  History of Present Illness:  [name] [name] is a 61 year old male with a past medical history significant for severe multivessel CAD, HFrEF (LVEF less than 20%), pulmonary hypertension, permanent atrial fibrillation on Eliquis, hypertension, hyperlipidemia, and chronic kidney disease who presented to [facility] ED on 07/15/2021 due to complaints of shortness of breath and lower extremity edema.  He was seen at the CHF clinic approximately 3 days ago where he appeared to be fluid overloaded with soft blood pressures.  He was referred to the ED at that time for further evaluation, but he refused to seek medical evaluation at that time.  He reported increasing shortness of breath and lower extremity edema, along with associated generalized malaise and fatigue.  He denied fever, chills, abdominal pain, nausea, vomiting, diarrhea, dysuria.  He reported he has been compliant with his home digoxin, Plavix, Eliquis.  It was unclear if he had been taking his diuretic as prescribed.  He also endorses that he continues to smoke, and has increased to 2 packs a day from 1 pack a day in the last 3 to 4 weeks.  Denied alcohol or illicit drug use.  07/20/21- patient remains in CHF , he is being diuresed and is low to respond. Reviewed care plan with Dr patel today.  I met with palliative specialist and wishes from [place] is to allow patient to go home with hospice.  We discussed this with cardiology and will stop milrinone.   Pertinent  Medical History  Severe multivessel coronary artery disease Chronic HFrEF (LVEF less  than 20%) Permanent atrial fibrillation on Eliquis Peripheral artery disease status post right BKA Hypertension Hyperlipidemia COPD Pulmonary artery hypertension Sleep apnea Chronic kidney disease Diabetes mellitus type 2 BPH Arthritis Diabetic neuropathy  Micro Data:  2/5: SARS-CoV-2 and influenza PCR>> negative  Antimicrobials:  N/A  Significant Hospital Events: Including procedures, antibiotic start and stop dates in addition to other pertinent events   2/5: Presented to ED with complaints of shortness of breath and lower extremity edema.  Admitted by the hospitalist 2/6: Cardiology, podiatry and vascular surgery consulted. LLE wounds debrided. Diuretics held due to worsening AKI 2/7: Cardiac catheterization performed due to concern for low output heart failure and developing cardiorenal syndrome.  Returns to the ICU post cath requiring Levophed with plan to place PICC line and start milrinone infusion.  PCCM consulted   Objective   Blood pressure 97/62, pulse (!) 106, temperature (!) 96.4 F (35.8 C), temperature source Oral, resp. rate 18, height $RemoveBe'5\' 7"'YVvrNhJvy$  (1.702 m), weight 94.3 kg, SpO2 92 %. CVP:  [13 mmHg-32 mmHg] 13 mmHg      Intake/Output Summary (Last 24 hours) at 07/20/2021 1234 Last data filed at 07/20/2021 1200 Gross per 24 hour  Intake 1084.76 ml  Output 1305 ml  Net -220.24 ml    Filed Weights   07/18/21 0500 07/19/21 0500 07/20/21 0500  Weight: 93.7 kg 96.5 kg 94.3 kg    Examination: General: Acute on chronically ill-appearing male, sitting in bed, on room air, no acute distress HENT: Atraumatic, normocephalic, neck supple, positive JVD Lungs: Fine crackles  bilateral upper fields, diminished bilateral lower fields with mild expiratory wheeze, even, nonlabored, no accessory muscle use Cardiovascular: Regular rate and rhythm, no murmurs, rubs, gallops Abdomen: Obese, soft, nontender, nondistended, no guarding rebound tenderness, bowel sounds positive  x4 Extremities: Right BKA, LLE with chronic wounds Neuro: Awake and alert, oriented x3, follows commands, no focal deficits, speech clear GU: Deferred  Resolved Hospital Problem list     Assessment & Plan:   Cardiogenic Shock Acute on Chronic HFrEF (LVEF <20% on Echo 06/15/21) Mildly Elevated Troponin, suspect demand ischemia Permanent A. Fib on Eliquis PMHx: severe CAD, PAD, tobacco use (smokes 2 PPD) -Continuous cardiac monitoring -Maintain MAP >65 -Vasopressors as needed to maintain MAP goal -Cardiology following, appreciate input -Plan to start milrinone infusion per cardiology ~ follow Co-ox panel -Hold antihypertensives -Diuresis as blood pressure and renal function permits ~currently on Lasix 80 mg twice daily -Continue Eliquis for anticoagulation -Echo 06/15/21: LVEF <20%, indeterminate diastolic parameters, RV systolic function severely reduced, severely elevated pulmonary artery systolic pressure -Cardiac Cath 2/7: Severe multivessel coronary artery disease, similar to prior catheterization from 01/2020. Severely elevated left heart, right heart, and pulmonary artery pressures. Moderately-severely reduced Fick cardiac output/index.  Acute Kidney Injury on CKD Concern for developing Cardiorenal Syndrome Hyponatremia, suspect Hypotonic Hypervolemic in setting of CHF Hyperkalemia PMHx: BPH -Monitor I&O's / urinary output -Follow BMP -Ensure adequate renal perfusion -Avoid nephrotoxic agents as able -Replace electrolytes as indicated -Inotropes as above -Diuresis as renal function and blood pressure permits -Consider nephrology consult -Continue Flomax  Acute COPD exacerbation PMHx: Pulmonary hypertension -Supplemental O2 as needed to maintain O2 sats 88 to 92% -Follow intermittent chest x-ray and ABG as needed -Continue bronchodilators, Breo Ellipta, Incruse Ellipta -Continue prednisone 40 mg daily  LLE wound PMHx: PAD s/p BKA of RLE -Continue Plavix and  Lipitor -Podiatry following, appreciate input ~ s/p Debridement of wounds on 2/6, further wound care as per Podiatry -Vascular Surgery following, appreciate input ~ tentative plan for angiogram on Wednesday 2/8  Diabetes Mellitus Type II -CBG's ac &hs ; Target range of 140 to 180 -SSI -Follow ICU Hypo/Hyperglycemia protocol     Pt is critically ill with severe HFrEF and concern for developing Cardiorenal syndrome, superimposed on multiple chronic co-morbidities.  Being placed on inotropes.  Prognosis is guarded, high risk for cardiac arrest and death.  Overall long term prognosis is poor.  Recommend DNR status.  Consider Palliative Care consult.  Best Practice (right click and "Reselect all SmartList Selections" daily)   Diet/type: Regular consistency (see orders) DVT prophylaxis: DOAC GI prophylaxis: PPI Lines: N/A Foley:  N/A Code Status:  full code Last date of multidisciplinary goals of care discussion [N/A]  Pt's significant other updated at bedside 07/17/21.  All questions answered to their satisfaction.  Labs   CBC: Recent Labs  Lab 07/16/21 0432 07/17/21 0603 07/18/21 0526 07/19/21 0526 07/20/21 0430  WBC 3.9* 8.2 6.5 6.0 7.0  HGB 9.1* 9.1* 8.3* 8.2* 8.7*  HCT 28.2* 27.8* 25.9* 25.0* 26.6*  MCV 77.7* 78.8* 78.2* 75.3* 75.1*  PLT 149* 184 121* 128* 131*     Basic Metabolic Panel: Recent Labs  Lab 07/16/21 0432 07/17/21 0603 07/17/21 1811 07/18/21 0526 07/19/21 0526 07/20/21 0430  NA 126* 121* 121* 120* 122* 124*  K 5.2* 5.5* 5.4* 4.7 4.2 3.8  CL 92* 87* 86* 84* 82* 82*  CO2 $Re'26 24 26 29 31 'imK$ 32  GLUCOSE 127* 162* 252* 219* 182* 154*  BUN 45* 62* 65* 64* 56* 53*  CREATININE 1.71* 2.02* 2.06* 1.78* 1.49* 1.27*  CALCIUM 8.6* 8.5* 8.7* 8.5* 8.7* 9.0  MG 2.3 2.4  --  2.3 2.3 2.0    GFR: Estimated Creatinine Clearance: 69.4 mL/min (A) (by C-G formula based on SCr of 1.27 mg/dL (H)). Recent Labs  Lab 07/15/21 1640 07/16/21 0432 07/17/21 0603  07/18/21 0526 07/19/21 0526 07/20/21 0430  PROCALCITON <0.10 0.11  --   --   --   --   WBC 5.6 3.9* 8.2 6.5 6.0 7.0     Liver Function Tests: Recent Labs  Lab 07/15/21 1640 07/16/21 0432  AST 32 25  ALT 14 14  ALKPHOS 108 108  BILITOT 2.9* 2.3*  PROT 6.5 6.1*  ALBUMIN 3.2* 3.3*    No results for input(s): LIPASE, AMYLASE in the last 168 hours. No results for input(s): AMMONIA in the last 168 hours.  ABG    Component Value Date/Time   HCO3 36.4 (H) 08/30/2019 2158   O2SAT 39.2 07/20/2021 0444      Coagulation Profile: Recent Labs  Lab 07/15/21 1640  INR 1.4*     Cardiac Enzymes: No results for input(s): CKTOTAL, CKMB, CKMBINDEX, TROPONINI in the last 168 hours.  HbA1C: Hemoglobin A1C  Date/Time Value Ref Range Status  04/04/2013 04:16 AM 9.8 (H) 4.2 - 6.3 % Final    Comment:    The American Diabetes Association recommends that a primary goal of therapy should be <7% and that physicians should reevaluate the treatment regimen in patients with HbA1c values consistently >8%.    Hgb A1c MFr Bld  Date/Time Value Ref Range Status  06/17/2021 05:56 AM 6.1 (H) 4.8 - 5.6 % Final    Comment:    (NOTE) Pre diabetes:          5.7%-6.4%  Diabetes:              >6.4%  Glycemic control for   <7.0% adults with diabetes   03/18/2021 11:10 AM 6.2 (H) 4.8 - 5.6 % Final    Comment:    (NOTE)         Prediabetes: 5.7 - 6.4         Diabetes: >6.4         Glycemic control for adults with diabetes: <7.0     CBG: Recent Labs  Lab 07/19/21 1127 07/19/21 1621 07/19/21 2101 07/20/21 0730 07/20/21 1141  GLUCAP 167* 166* 137* 150* 151*     Review of Systems:   Positives in BOLD: Gen: Denies fever, chills, weight change, fatigue, night sweats HEENT: Denies blurred vision, double vision, hearing loss, tinnitus, sinus congestion, rhinorrhea, sore throat, neck stiffness, dysphagia PULM: Denies shortness of breath, cough, sputum production, hemoptysis,  wheezing CV: Denies chest pain, edema, orthopnea, paroxysmal nocturnal dyspnea, palpitations GI: Denies abdominal pain, nausea, vomiting, diarrhea, hematochezia, melena, constipation, change in bowel habits GU: Denies dysuria, hematuria, polyuria, oliguria, urethral discharge Endocrine: Denies hot or cold intolerance, polyuria, polyphagia or appetite change Derm: Denies rash, dry skin, scaling or peeling skin change Heme: Denies easy bruising, bleeding, bleeding gums Neuro: Denies headache, numbness, weakness, slurred speech, loss of memory or consciousness   Past Medical History:  He,  has a past medical history of Acute on chronic heart failure ([place]), AKI (acute kidney injury) ([place]) (01/03/2018), Angina at rest [facility]) (08/13/2017), Arthritis, Asthma, Atherosclerosis, BPH (benign prostatic hyperplasia), Carotid arterial disease ([place]), Cellulitis of foot, left (09/27/2019), Charcot's joint of foot, right, Chronic combined systolic (congestive) and diastolic (congestive) heart failure ([place]), Chronic foot  pain (Secondary Area of Pain) (Right) (01/29/2018), COPD (chronic obstructive pulmonary disease) ([place]), COPD with acute exacerbation (HCC) (01/30/2020), Coronary artery disease, Diabetic neuropathy ([place]), Diabetic ulcer of right foot ([place]), Hernia, HFrEF (heart failure with reduced ejection fraction) ([place]) (01/30/2020), Hyperlipidemia, Hypertension, Ischemic cardiomyopathy, Morbid obesity ([place]), Neuropathy, PAD (peripheral artery disease) ([place]), Persistent atrial fibrillation ([place]), Restless leg syndrome, Sepsis ([place]) (03/30/2018), Sleep apnea, Subclavian artery stenosis, right ([place]), Syncope and collapse, Tobacco use, Type II diabetes mellitus ([place]), Unstable angina ([place]) (07/11/2016), and Varicose vein.   Surgical History:   Past Surgical History:  Procedure Laterality Date   ABDOMINAL AORTAGRAM N/A 06/23/2013   Procedure: ABDOMINAL Maxcine Ham;  Surgeon: Wellington [place], MD;  Location: [place] CATH LAB;   Service: Cardiovascular;  Laterality: N/A;   AMPUTATION Right 04/01/2018   Procedure: AMPUTATION BELOW KNEE;  Surgeon: Algernon Huxley, MD;  Location: [facility] ORS;  Service: Vascular;  Laterality: Right;   AMPUTATION TOE Left 08/11/2020   Procedure: AMPUTATION TOE-2nd Toe;  Surgeon: Samara Deist, DPM;  Location: [facility] ORS;  Service: Podiatry;  Laterality: Left;   APPENDECTOMY     CARDIAC CATHETERIZATION  10/14   [place]; X1 STENT PROXIMAL RCA   CARDIAC CATHETERIZATION  02/2010   [facility]   CARDIAC [facility]IZATION  [facility]/21/2014   [facility]   CLAVICLE SURGERY     HERNIA REPAIR     IRRIGATION AND DEBRIDEMENT FOOT Right 01/04/2018   Procedure: IRRIGATION AND DEBRIDEMENT FOOT;  Surgeon: Samara Deist, DPM;  Location: [facility] ORS;  Service: Podiatry;  Laterality: Right;   LEFT HEART CATH AND CORONARY ANGIOGRAPHY N/A 01/20/2020   Procedure: LEFT HEART CATH AND CORONARY ANGIOGRAPHY;  Surgeon: Wellington Hampshire, MD;  Location: [place] CV LAB;  Service: Cardiovascular;  Laterality: N/A;   LEFT HEART CATH AND CORS/GRAFTS ANGIOGRAPHY N/A 04/28/2017   Procedure: LEFT HEART CATH AND CORONARY ANGIOGRAPHY;  Surgeon: Wellington Hampshire, MD;  Location: [place] CV LAB;  Service: Cardiovascular;  Laterality: N/A;   LOWER EXTREMITY ANGIOGRAPHY Right 03/03/2018   Procedure: LOWER EXTREMITY ANGIOGRAPHY;  Surgeon: Katha Cabal, MD;  Location: [place] CV LAB;  Service: Cardiovascular;  Laterality: Right;   LOWER EXTREMITY ANGIOGRAPHY Left 08/24/2018   Procedure: LOWER EXTREMITY ANGIOGRAPHY;  Surgeon: Algernon Huxley, MD;  Location: [place] CV LAB;  Service: Cardiovascular;  Laterality: Left;   LOWER EXTREMITY ANGIOGRAPHY Left 06/14/2019   Procedure: LOWER EXTREMITY ANGIOGRAPHY;  Surgeon: Algernon Huxley, MD;  Location: [place] CV LAB;  Service: Cardiovascular;  Laterality: Left;   LOWER EXTREMITY ANGIOGRAPHY Left 08/02/2019   Procedure: LOWER EXTREMITY ANGIOGRAPHY;  Surgeon: Algernon Huxley, MD;  Location: [place]  CV LAB;  Service: Cardiovascular;  Laterality: Left;   LOWER EXTREMITY ANGIOGRAPHY Left 07/17/2020   Procedure: LOWER EXTREMITY ANGIOGRAPHY;  Surgeon: Algernon Huxley, MD;  Location: [place] CV LAB;  Service: Cardiovascular;  Laterality: Left;   PERIPHERAL ARTERIAL STENT GRAFT     x2 left/right   PERIPHERAL VASCULAR BALLOON ANGIOPLASTY Right 01/06/2018   Procedure: PERIPHERAL VASCULAR BALLOON ANGIOPLASTY;  Surgeon: Katha Cabal, MD;  Location: [place] CV LAB;  Service: Cardiovascular;  Laterality: Right;   PERIPHERAL VASCULAR CATHETERIZATION N/A 07/06/2015   Procedure: Abdominal Aortogram w/Lower Extremity;  Surgeon: Algernon Huxley, MD;  Location: [place] CV LAB;  Service: Cardiovascular;  Laterality: N/A;   PERIPHERAL VASCULAR CATHETERIZATION  07/06/2015   Procedure: Lower Extremity Intervention;  Surgeon: Algernon Huxley, MD;  Location: [place] CV LAB;  Service: Cardiovascular;;   RIGHT  HEART CATH AND CORONARY ANGIOGRAPHY N/A 07/17/2021   Procedure: RIGHT HEART CATH AND CORONARY ANGIOGRAPHY;  Surgeon: Nelva Bush, MD;  Location: [place] CV LAB;  Service: Cardiovascular;  Laterality: N/A;     Social History:   reports that he has been smoking cigarettes. He has a 41.00 pack-year smoking history. He has never used smokeless tobacco. He reports that he does not drink alcohol and does not use drugs.   Family History:  His family history includes Heart attack in his father; Heart disease in his father; Hyperlipidemia in his father and mother; Hypertension in his father and mother.   Allergies Allergies  Allergen Reactions   Dulaglutide Anaphylaxis, Diarrhea and Hives   Ace Inhibitors     hypotension   Other Itching    Skin itching associated with nitro patch   Prednisone Rash     Home Medications  Prior to Admission medications   Medication Sig Start Date End Date Taking? Authorizing Provider  albuterol (VENTOLIN HFA) 108 (90 Base) MCG/ACT inhaler Inhale into  the lungs every 6 (six) hours as needed for wheezing or shortness of breath.   Yes [provider]  apixaban (ELIQUIS) 5 MG TABS tablet Take 5 mg by mouth 2 (two) times daily.   Yes [provider]  atorvastatin (LIPITOR) 80 MG tablet TAKE 1 TABLET BY MOUTH ONCE DAILY 06/12/21  Yes Gollan, Kathlene November, MD  carvedilol (COREG) 3.125 MG tablet TAKE 1 TABLET BY MOUTH TWICE DAILY WITH MEALS 06/12/21  Yes Gollan, Kathlene November, MD  clopidogrel (PLAVIX) 75 MG tablet TAKE 1 TABLET BY MOUTH ONCE DAILY 06/12/21  Yes Gollan, Kathlene November, MD  digoxin (LANOXIN) 0.125 MG tablet TAKE 1 TABLET BY MOUTH ONCE DAILY 06/12/21  Yes Gollan, Kathlene November, MD  DULoxetine (CYMBALTA) 60 MG capsule Take 60 mg by mouth daily.   Yes [provider]  ezetimibe (ZETIA) 10 MG tablet Take 1 tablet (10 mg total) by mouth daily. 10/09/20  Yes Minna Merritts, MD  gabapentin (NEURONTIN) 800 MG tablet Take 800 mg by mouth 4 (four) times daily.  08/14/17 04/04/29 Yes [provider]  ipratropium-albuterol (DUONEB) 0.5-2.5 (3) MG/3ML SOLN Inhale 3 mLs into the lungs every 6 (six) hours as needed (wheezing/sob).    Yes [provider]  losartan (COZAAR) 25 MG tablet Take 0.5 tablets (12.5 mg total) by mouth daily. 10/09/20  Yes Minna Merritts, MD  metFORMIN (GLUCOPHAGE-XR) 500 MG 24 hr tablet Take 1,000 mg by mouth 2 (two) times daily. 01/02/21  Yes [provider]  Multiple Vitamin (MULTI-VITAMINS) TABS Take 1 tablet by mouth daily.    Yes [provider]  omeprazole (PRILOSEC) 40 MG capsule Take 40 mg by mouth daily.   Yes [provider]  Oxycodone HCl 10 MG TABS Take 10 mg by mouth every 4 (four) hours as needed (pain).   Yes [provider]  polyethylene glycol (MIRALAX / GLYCOLAX) 17 g packet Take 17 g by mouth daily as needed for mild constipation. 08/12/20  Yes Wieting, Richard, MD  potassium chloride (KLOR-CON) 10 MEQ tablet TAKE 1 TABLET BY MOUTH TWICE DAILY 06/12/21  Yes  Gollan, Kathlene November, MD  promethazine (PHENERGAN) 25 MG tablet Take 25 mg by mouth every 8 (eight) hours as needed. 04/02/21  Yes [provider]  spironolactone (ALDACTONE) 25 MG tablet Take 0.5 tablets (12.5 mg total) by mouth daily. 10/09/20  Yes Minna Merritts, MD  tamsulosin (FLOMAX) 0.4 MG CAPS capsule Take 1 capsule (  0.4 mg total) by mouth daily after supper. 07/01/20  Yes Shawna Clamp, MD  traZODone (DESYREL) 150 MG tablet Take 150 mg by mouth at bedtime. 07/12/19  Yes [provider]  VERQUVO 10 MG TABS TAKE 1 TABLET BY MOUTH ONCE DAILY 11/16/20  Yes Alisa Graff, FNP  Fluticasone-Umeclidin-Vilant 100-62.5-25 MCG/INH AEPB Inhale 1 puff into the lungs daily.    [provider]  nitroGLYCERIN (NITROSTAT) 0.4 MG SL tablet Place 1 tablet (0.4 mg total) under the tongue every 5 (five) minutes as needed for chest pain. 10/09/20   Minna Merritts, MD  torsemide 40 MG TABS Take 40 mg by mouth 2 (two) times daily. Take 40 mg by mouth twice daily. 03/30/21 07/11/21  Sidney Ace, MD     Critical care provider statement:   Total critical care time: 33 minutes   Performed by: Lanney Gins MD   Critical care time was exclusive of separately billable procedures and treating other patients.   Critical care was necessary to treat or prevent imminent or life-threatening deterioration.   Critical care was time spent personally by me on the following activities: development of treatment plan with patient and/or surrogate as well as nursing, discussions with consultants, evaluation of patient's response to treatment, examination of patient, obtaining history from patient or surrogate, ordering and performing treatments and interventions, ordering and review of laboratory studies, ordering and review of radiographic studies, pulse oximetry and re-evaluation of patient's condition.    Ottie Glazier, M.D.  Pulmonary & Critical Care Medicine

## 2021-07-20 NOTE — Progress Notes (Signed)
[facility]) Hospital Liaison Note   Received request from Transitions of Care Manager, Pryor Montes, for hospice services at home after discharge. MSW contacted SI/[name] to explain/discuss services. [name] did not answer and MSW unable to leave Vm. Chart and patient information to be reviewed by [facility] physician upon [facility] contacting MSW.    Please call with any questions/concerns.    Thank you for the opportunity to participate in this patient's care.   Daphene Calamity, MSW [facility] Liaison  [phone]

## 2021-07-20 NOTE — TOC Progression Note (Signed)
Transition of Care [facility]) - Progression Note    Patient Details  Name: [name] [name] MRN: [id number] Date of Birth: [birth year]  Transition of Care [facility]) CM/SW Contact  Shelbie Hutching, RN Phone Number: 07/20/2021, 3:14 PM  Clinical Narrative:    Patient's family has decided on comfort care and hospice at home.  It is important to the patient's significant other that the die at home as were his wishes.  Significant other [name] [name] chooses [place] [facility], [name] [name] given hospice referral.  Patient does not need a hospital bed but will need oxygen, and bedside commode.  Plan for discharge as soon as equipment is available.     Expected Discharge Plan: Home w Hospice Care Barriers to Discharge: Equipment Delay  Expected Discharge Plan and Services Expected Discharge Plan: [place]   Discharge Planning Services: CM Consult Post Acute Care Choice: Hospice Living arrangements for the past 2 months: Single Family Home                                       Social Determinants of Health (SDOH) Interventions    Readmission Risk Interventions Readmission Risk Prevention Plan 07/18/2021 06/16/2021 03/20/2021  Transportation Screening Complete Complete Complete  HRI or [place] Work Consult for [place] Planning/Counseling - - -  SW consult not completed comments - - -  Palliative Care Screening - - -  Medication Review Press photographer) Complete Complete Complete  PCP or Specialist appointment within 3-5 days of discharge Complete Complete Complete  HRI or Home Care Consult Complete Complete Complete  SW Recovery Care/Counseling Consult Complete Complete Complete  Palliative Care Screening Not Applicable Not Applicable Not [place] Not Applicable Not Applicable Not Applicable  Some recent data might be hidden

## 2021-07-20 NOTE — Progress Notes (Signed)
[facility] NAME: [name] [name]    MR#:  [id number]  DATE OF BIRTH:  [birth year]  SUBJECTIVE:  patient appears intermittent lethargic with confusion. Having some jerky movements.  Patient's age 61 and girlfriend [name] [name] in the room. She understands patient has poor prognosis. Overall not much improvement.  VITALS:  Blood pressure 116/84, pulse (!) 128, temperature (!) 96.4 F (35.8 C), temperature source Oral, resp. rate 18, height 5\' 7"  (1.702 m), weight 94.3 kg, SpO2 97 %.  PHYSICAL EXAMINATION:   GENERAL:  61 y.o.-year-old patient lying in the bed with no acute distress. Chronically ill, lethargic LUNGS: decreased breath sounds bilaterally, no wheezing, rales, rhonchi.  CARDIOVASCULAR: S1, S2 normal. No murmurs, rubs, or gallops.  ABDOMEN: Soft, nontender, nondistended. Bowel sounds present.  EXTREMITIES: right below knee amputation. Left ankle chronic ulcer NEUROLOGIC: nonfocal, jerky movements  LABORATORY PANEL:  CBC Recent Labs  Lab 07/20/21 0430  WBC 7.0  HGB 8.7*  HCT 26.6*  PLT 131*     Chemistries  Recent Labs  Lab 07/16/21 0432 07/17/21 0603 07/20/21 0430  NA 126*   < > 124*  K 5.2*   < > 3.8  CL 92*   < > 82*  CO2 26   < > 32  GLUCOSE 127*   < > 154*  BUN 45*   < > 53*  CREATININE 1.71*   < > 1.27*  CALCIUM 8.6*   < > 9.0  MG 2.3   < > 2.0  AST 25  --   --   ALT 14  --   --   ALKPHOS 108  --   --   BILITOT 2.3*  --   --    < > = values in this interval not displayed.    Cardiac Enzymes No results for input(s): TROPONINI in the last 168 hours. RADIOLOGY:  No results found.  Assessment and Plan [name] [name] is a 61 y.o. male with medical history significant for HFrEF, CAD, a-fib, COPD, HTN, DMT2, OSA, tobacco use who presented for evaluation of shortness of breath and swelling.     Acute on chronic HFrEF (heart failure with reduced ejection fraction --LVEF<20%.  Patient does not have an  AICD. --BNP chronically elevated.  CXR showed Mild pulmonary vascular congestion. No evidence of pulmonary Edema. --Cr worsened with just 1 dose of IV lasix 40 --right and left heart cath 07/17/2021 found to have severely elevated left and right heart filling pressures and severely reduced cardiac output.   -cont iV milrinone and pressor for low BP.   --2/8 --IV lasix 80 mg BID started per [facility] cardiology --2/9--cont iv milrinone. UOP 3liters 2/8. Card considering IV lasix gtt with metolazone --sats 90-93% on RA -- per cardiology not a candidate for AICD, h/o  noncompliance --2/10-- overall mentation decline today. Patient intermittently confused. Girlfriend/H POA [name] [name] in the room. She earlier had discussion with nurse practitioner [name] [name] at length. She is aware of patient is very poor prognosis and wants to facilitate patient's wishes of going home with hospice. She understands patient's poor heart condition. Discussed with ICU attending [name] [name] and is in agreement as well. Discussed with cardiology and they're okay with discontinuing milrinone gtt. -- TOC for hospice services at home   Acute COPD exacerbation --dyspnea contributed by COPD exacerbation, since pt has significant wheezing, cough, and smokes 2 packs per day.  Does not appear to have PNA. --received IV solumedrol 125  mg on admission --cont steroid as prednisone 40 mg daily--completed 4 dasy --DuoNeb scheduled    AKI (acute kidney injury) 2/2 Cardiorenal syndrome --diuretic per cardiology --creat 1.49    Type 2 diabetes mellitus with hyperlipidemia  Hyperglycemia exacerbated by steroid use Last HgbA1c was 6.1 on 06/17/21 --SSI for now      Hypotension Pt with soft BP in ER and hx of cardiogenic shock in past. Was given dose of midodrine in ER.  --transfer to ICU for pressor, per cardio rec     Permanent atrial fibrillation  --hold digoxin due to AKI --cont Eliquis     Hyponatremia --Na 123 on  presentation, worsening, likely due to hypervolemia --diuresis per cardio     PAD (peripheral artery disease) Chronic. --cont statin and Zetia --cont plavix     Tobacco use Smokes 2 ppd.  --cont nicotine patch     Chronic LLE wounds --podiatry consulted and did bed-side I/D --wound care per order     DVT prophylaxis: [id number] Code Status: DNR Family Communication: girlfriend [name] [name] updated at bedside today   Level of care: ICU Dispo:   The patient is from: home Anticipated d/c is to: home Anticipated d/c date is: > 3 days patient will discharged tomorrow home with hospice once hospice services have been arranged patient overall has a poor prognosis. He has significant comorbidities high risk for cardiorespiratory arrest.     TOTAL TIME TAKING CARE OF THIS PATIENT: 79minutes.  >50% time spent on counselling and coordination of care  Note: This dictation was prepared with Dragon dictation along with smaller phrase technology. Any transcriptional errors that result from this process are unintentional.  [name] [name] M.D    [facility]   CC: Primary care physician; [name] Jacobs, MD

## 2021-07-20 NOTE — Progress Notes (Signed)
Progress Note  Patient Name: [name] [name] Date of Encounter: 07/20/2021  Primary Cardiologist: Rockey Situ  Subjective   Feels about the same today as he did yesterday.  Documented urine output 600 mL for the past 24 hours with a net -1.9 L for the admission.  He denies chest pain.  Abdomen still feels distended to him.  Inpatient Medications    Scheduled Meds:  apixaban  5 mg Oral BID   atorvastatin  80 mg Oral Daily   Chlorhexidine Gluconate Cloth  6 each Topical Daily   clopidogrel  75 mg Oral Daily   docusate sodium  100 mg Oral Daily   DULoxetine  60 mg Oral Daily   ezetimibe  10 mg Oral Daily   fluticasone furoate-vilanterol  1 puff Inhalation Daily   And   umeclidinium bromide  1 puff Inhalation Daily   furosemide  80 mg Intravenous BID   gabapentin  800 mg Oral QID   insulin aspart  0-15 Units Subcutaneous TID WC   insulin aspart  0-5 Units Subcutaneous QHS   multivitamin with minerals  1 tablet Oral Daily   nicotine  21 mg Transdermal Daily   pantoprazole  40 mg Oral Daily   sodium chloride flush  10-40 mL Intracatheter Q12H   tamsulosin  0.4 mg Oral QPC supper   Continuous Infusions:  sodium chloride Stopped (07/17/21 1949)   milrinone 0.25 mcg/kg/min (07/20/21 1300)   PRN Meds: acetaminophen **OR** acetaminophen, albuterol, ipratropium-albuterol, nitroGLYCERIN, oxyCODONE, senna-docusate   Vital Signs    Vitals:   07/20/21 1200 07/20/21 1212 07/20/21 1230 07/20/21 1300  BP: (!) 116/102 97/62 102/65 101/83  Pulse: (!) 117 (!) 106  (!) 110  Resp: $Remo'14 18 16 17  'YoUrp$ Temp: (!) 96.4 F (35.8 C)     TempSrc: Oral     SpO2: (!) 89% 92%  99%  Weight:      Height:        Intake/Output Summary (Last 24 hours) at 07/20/2021 1457 Last data filed at 07/20/2021 1300 Gross per 24 hour  Intake 688.06 ml  Output 1305 ml  Net -616.94 ml    Filed Weights   07/18/21 0500 07/19/21 0500 07/20/21 0500  Weight: 93.7 kg 96.5 kg 94.3 kg    Telemetry    Afib with PVCs  - Personally Reviewed  ECG    No new tracings - Personally Reviewed  Physical Exam   GEN: No acute distress.   Neck: JVD elevated to the angle of the mandible. Cardiac: IRIR with occasional ectopy, no murmurs, rubs, or gallops.  Respiratory: Diminished and coarse breath sounds bilaterally.  GI: Soft, nontender, distended.   MS: 1+ edema with left foot in pressure boot; No deformity. Neuro:  Alert and oriented x 3; Nonfocal.  Psych: Normal affect.  Labs    Chemistry Recent Labs  Lab 07/15/21 1640 07/16/21 0432 07/17/21 0603 07/18/21 0526 07/19/21 0526 07/20/21 0430  NA 123* 126*   < > 120* 122* 124*  K 5.1 5.2*   < > 4.7 4.2 3.8  CL 90* 92*   < > 84* 82* 82*  CO2 26 26   < > 29 31 32  GLUCOSE 98 127*   < > 219* 182* 154*  BUN 39* 45*   < > 64* 56* 53*  CREATININE 1.55* 1.71*   < > 1.78* 1.49* 1.27*  CALCIUM 8.1* 8.6*   < > 8.5* 8.7* 9.0  PROT 6.5 6.1*  --   --   --   --  ALBUMIN 3.2* 3.3*  --   --   --   --   AST 32 25  --   --   --   --   ALT 14 14  --   --   --   --   ALKPHOS 108 108  --   --   --   --   BILITOT 2.9* 2.3*  --   --   --   --   GFRNONAA 52* 46*   < > 44* 54* >60  ANIONGAP 7 8   < > $R'7 9 10   'jJ$ < > = values in this interval not displayed.      Hematology Recent Labs  Lab 07/18/21 0526 07/19/21 0500 07/19/21 0526 07/20/21 0430  WBC 6.5  --  6.0 7.0  RBC 3.31* 3.29* 3.32* 3.54*  HGB 8.3*  --  8.2* 8.7*  HCT 25.9*  --  25.0* 26.6*  MCV 78.2*  --  75.3* 75.1*  MCH 25.1*  --  24.7* 24.6*  MCHC 32.0  --  32.8 32.7  RDW 19.9*  --  19.8* 19.5*  PLT 121*  --  128* 131*     Cardiac EnzymesNo results for input(s): TROPONINI in the last 168 hours. No results for input(s): TROPIPOC in the last 168 hours.   BNP Recent Labs  Lab 07/15/21 1640  BNP 1,987.6*      DDimer No results for input(s): DDIMER in the last 168 hours.   Radiology    DG Chest Port 1 View  Result Date: 07/17/2021 IMPRESSION: No significant change from 07/15/2021.  Possible mild interstitial pulmonary edema. Moderately to markedly enlarged cardiac silhouette is unchanged from prior. Electronically Signed   By: Yvonne Kendall M.D.   On: 07/17/2021 17:41   Cardiac Studies   [facility] 07/17/2021: Conclusions: Severe multivessel coronary artery disease, similar to prior catheterization from 01/2020. Severely elevated left heart, right heart, and pulmonary artery pressures. Moderately-severely reduced Fick cardiac output/index.   Recommendations: Medical management of stable severe coronary artery disease. Transfer to ICU for initiation of milrinone.  PICC line to be placed by IV team for infusion of vasoactive medications as well as hemodynamic monitoring. Discontinue vericiguat due to soft BP and continue to hold carvedilol, losartan, and spironolactone.  Addition of norepinephrine may need to be considered if hypotension worsens. Restart furosemide at 80 mg IV BID (first dose given during catheterization).  Further dose escalation may be necessary based on response to furosemide and milrinone. Restart apixaban 5 mg BID tonight if no evidence of bleeding/vascular injury at catheterization sites. __________  2D echo 06/15/2021: 1. Left ventricular ejection fraction, by estimation, is <20%. The left  ventricle has severely decreased function. The left ventricle demonstrates  global hypokinesis. The left ventricular internal cavity size was severely  dilated. Left ventricular  diastolic parameters are indeterminate.   2. Right ventricular systolic function is severely reduced. The right  ventricular size is moderately enlarged. There is severely elevated  pulmonary artery systolic pressure. The estimated right ventricular  systolic pressure is 09.2 mmHg.   3. The mitral valve is normal in structure. No evidence of mitral valve  regurgitation. No evidence of mitral stenosis.   4. The aortic valve is normal in structure. Aortic valve regurgitation is  not  visualized. Aortic valve sclerosis/calcification is present, without  any evidence of aortic stenosis.   5. The inferior vena cava is dilated in size with <50% respiratory  variability, suggesting right atrial pressure of 15  mmHg.   Patient Profile     61 y.o. male with history of CAD s/p prior PCI, chronic combined systolic and diastolic congestive heart failure, ICM, pulmonary hypertension, tobacco abuse, COPD, HTN, HLD, permanent atrial fibrillation, obesity, obstructive sleep apnea, type 2 diabetes mellitus, stage III chronic kidney disease, peripheral arterial disease status post right BKA, and carotid arterial disease, who is being seen today for the evaluation of low output CHF.  Assessment & Plan    1. Acute on chronic low output HFrEF with pulmonary hypertension/ICM: -He remains significantly volume up -Increase milrinone back to 0.25 -Give metolazone 5 mg with afternoon dose of IV Lasix 80 mg -Uncertain validity of coox -Relative hypotension precludes escalation of GDMT -Overall, poor prognosis, palliative care on board   2. CAD involving the native coronary arteries with elevated troponin: -No chest pain -LHC this admission as above with medical management of severe stable CAD -Plavix -Lipitor -Zetia  3. Permanent Afib: -Ventricular rates reasonably controlled, not on AV nodal blocking medications -With titrated dose of milrinone, he will become a little more tachycardic, though upon reviewing telemetry on increased dose of milrinone he is not having sustained runs of ventricular ectopy -The patient has an underlying bundle branch block, that is stable -CHADS2VASc at least 4 -Eliquis  4. PAD: -Followed by vascular surgery -PTA Plavix  Received Epic chat message as I was dictating his note indicating the patient and family had met with palliative care.  The patient and family would like to de-escalate care and focus on comfort.  Will await MD review.      For  questions or updates, please contact [place] Please consult [email or web address] for contact info under Cardiology/STEMI.    Signed, Christell Faith, PA-C [place] Pager: [phone] 07/20/2021, 2:57 PM

## 2021-07-20 NOTE — Progress Notes (Signed)
ICU secretary called On Call Chaplain to follow up with patient regarding [place] of [place] status. Chaplain Maggie spoke with patient's nurse and then patient's partner The patient was asleep. Patient's partner, [name], shared that she was given POA responsibility and the AD was completed here at [facility] already. Chaplain and patient's nurse then checked the chart and confirmed AD was made on 06/22/20 and is uploaded in [place] section of chart. [name] also explicitly expressed she does not at this time support changing pt's code status. Chaplain expects to follow up when patient is awake and alert.

## 2021-07-20 NOTE — Progress Notes (Signed)
[facility]  Daily Progress Note   Subjective  -  [place] is a 61 year old male with a well-known history of vascular disease.  He also has history significant for diabetes mellitus, hypertension, hyperlipidemia COPD and HFrEF.  The patient was initially admitted due to worsening shortness of breath and exacerbation of his HFrEF.  On presentation it was noted that he had multiple wounds to his left lower extremity in the foot and ankle area.  He has had a previous history of a right below-knee amputation.  There have been a multitude of previous interventions on his left lower extremity.  Initially we plan to intervene on his left lower extremity on Wednesday however he underwent a heart catheterization on Tuesday and was also started on milrinone at that time.  Due to the increasing creatinine we felt it was best to hold off on intervention until his creatinine numbers decreased and to allow time for recovery from IV contrast.  Since that time the patient has been receiving  multiple doses of Lasix however he has not been very responsive.  The patient also has a known significantly reduced ejection fraction of less than 20%.  Today the patient himself is resting comfortably, the bedside nurse reports that the patient has not slept in approximately 2 days.  However his girlfriend who has power of attorney is at bedside.  Objective Vitals:   07/20/21 1200 07/20/21 1212 07/20/21 1230 07/20/21 1300  BP: (!) 116/102 97/62 102/65 101/83  Pulse: (!) 117 (!) 106  (!) 110  Resp: 14 18 16 17   Temp: (!) 96.4 F (35.8 C)     TempSrc: Oral     SpO2: (!) 89% 92%  99%  Weight:      Height:        Intake/Output Summary (Last 24 hours) at 07/20/2021 1328 Last data filed at 07/20/2021 1200 Gross per 24 hour  Intake 981.23 ml  Output 1305 ml  Net -323.77 ml    PULM  fine crackles bilaterally CV  RRR VASC  nonpalpable pulses, no wounds to left foot and ankle  area  Laboratory CBC    Component Value Date/Time   WBC 7.0 07/20/2021 0430   HGB 8.7 (L) 07/20/2021 0430   HGB 15.0 06/08/2014 1037   HCT 26.6 (L) 07/20/2021 0430   HCT 44.5 06/08/2014 1037   PLT 131 (L) 07/20/2021 0430   PLT 261 06/08/2014 1037    BMET    Component Value Date/Time   NA 124 (L) 07/20/2021 0430   NA 129 (L) 04/17/2021 1148   NA 136 06/08/2014 1037   K 3.8 07/20/2021 0430   K 4.3 06/08/2014 1037   CL 82 (L) 07/20/2021 0430   CL 101 06/08/2014 1037   CO2 32 07/20/2021 0430   CO2 27 06/08/2014 1037   GLUCOSE 154 (H) 07/20/2021 0430   GLUCOSE 415 (H) 06/08/2014 1037   BUN 53 (H) 07/20/2021 0430   BUN 31 (H) 04/17/2021 1148   BUN 9 06/08/2014 1037   CREATININE 1.27 (H) 07/20/2021 0430   CREATININE 0.87 02/05/2018 1417   CALCIUM 9.0 07/20/2021 0430   CALCIUM 9.2 06/08/2014 1037   GFRNONAA >60 07/20/2021 0430   GFRNONAA >60 06/08/2014 1037   GFRNONAA >60 02/17/2014 0909   GFRAA 52 (L) 07/07/2020 1211   GFRAA >60 06/08/2014 1037   GFRAA >60 02/17/2014 0909    Assessment/Planning: 1.  Peripheral arterial disease with ulceration I discussed with the patient's girlfriend power of  attorney, the angiogram procedure to attempt intervention to improve the perfusion to the left lower extremity.  I discussed with the patient that even with revascularization, his significantly reduced ejection fraction may hamper his healing abilities.  He will continue to follow with podiatry for wound care.  We will tentatively plan on proceeding on Monday.  However we will plan on proceeding in the afternoon to assess the patient's cardiovascular status in the morning to ensure that he is stable prior to performing any intervention.  We discussed the risk, benefits and alternatives of the angiogram procedure the patient agrees to proceed.   Kris Hartmann  07/20/2021, 1:28 PM

## 2021-07-20 NOTE — Consult Note (Signed)
Consultation Note Date: 07/20/2021   Patient Name: ONDRE SALVETTI  DOB: Dec 08, 1960  MRN: [id number]  Age / Sex: 61 y.o., male  PCP: [name], MD Referring Physician: Fritzi Mandes, MD  Reason for Consultation: Establishing goals of care  HPI/Patient Profile:Jaceon MARSDEN ZAINO is a 62 y.o. male with medical history significant for HFrEF, CAD, a-fib, COPD, HTN, DMT2, OSA, tobacco use who presents for evaluation of shortness of breath and swelling.  He was seen 3 days ago at the CHF clinic and at that time he looked to be fluid overloaded and had soft blood pressure and was referred to the emergency room for further evaluation and admission but he refused at that time.  He lives with his girlfriend.  He has had increasing shortness of breath and edema of his left lower leg over the last week.  Symptoms have worsened in the last few days.  Reports that "he feels like crap" and is having generalized weakness and fatigue.  He has increased shortness of breath with minimal exertion.  Swelling in his left leg has increased.  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  Has any fever, chills, abdominal pain, nausea vomiting diarrhea, urinary symptoms.  He does have intermittent chest pain and palpitations but has not been worse than normal he is in the last few days.  He is on digoxin, Plavix, Eliquis at home and states he has been taking it.  Is not clear if he has been taking his diuretic at home.  Continues to smoke and has increased to 2 packs a day from 1 pack a day in the last 3 to 4 weeks.   Clinical Assessment and Goals of Care: Patient is in bed. He appears restless and agitated. His girlfriend who is his confirmed HPOA is at bedside. She is tearful. She discusses his decline. She states since last hospitalization, he has not been himself. She states he has been confused and at times disoriented and running into things. She states he has  been seeing dead pets and relatives.   We discussed his diagnoses, prognosis, GOC, EOL wishes disposition and options.  A detailed discussion was had today regarding advanced directives.  Concepts specific to code status, artifical feeding and hydration, IV antibiotics and rehospitalization were discussed.  The difference between an aggressive medical intervention path and a comfort care path was discussed.  Values and goals of care important to patient and family were attempted to be elicited.  Discussed limitations of medical interventions to prolong quality of life in some situations and discussed the concept of human mortality.  She states she is aware of his wishes. She states he would want to die at home. She recognizes his poor prognosis. She requests to take him home with hospice. This was discussed with primary MD and CCM.   I completed a MOST form today with [name]- confirmed HPOA, and the signed original was placed in the chart. A photocopy was placed in the chart to be scanned into EMR. The patient outlined their  wishes for the following treatment decisions:  Cardiopulmonary Resuscitation: Do Not Attempt Resuscitation (DNR/No CPR)  Medical Interventions: Comfort Measures: Keep clean, warm, and dry. Use medication by any route, positioning, wound care, and other measures to relieve pain and suffering. Use oxygen, suction and manual treatment of airway obstruction as needed for comfort. Do not transfer to the hospital unless comfort needs cannot be met in current location.  Antibiotics: No antibiotics (use other measures to relieve symptoms)  IV Fluids: No IV fluids (provide other measures to ensure comfort)  Feeding Tube: No feeding tube     [place] with hospice. She is aware his prognosis is extremely limited, but it is important to her that he die at home as this was his wishes.       Primary Diagnoses: Present on Admission:  Acute on chronic HFrEF  (heart failure with reduced ejection fraction) (HCC)  CAD (coronary artery disease)  PAD (peripheral artery disease) (HCC)  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HCC)  Type 2 diabetes mellitus with hyperlipidemia ([place])  AKI (acute kidney injury) ([place])  COPD with acute exacerbation (HCC)  Tobacco use  Hyponatremia  Permanent atrial fibrillation (HCC)  Hypotension   I have reviewed the medical record, interviewed the patient and family, and examined the patient. The following aspects are pertinent.  Past Medical History:  Diagnosis Date   Acute on chronic heart failure ([place])    AKI (acute kidney injury) ([place]) 01/03/2018   Angina at rest [facility]) 08/13/2017   Arthritis    Asthma    Atherosclerosis    BPH (benign prostatic hyperplasia)    Carotid arterial disease ([place])    Cellulitis of foot, left 09/27/2019   Charcot's joint of foot, right    Chronic combined systolic (congestive) and diastolic (congestive) heart failure ([place])    a. 02/2013 EF 50% by LV gram; b. 04/2017 Echo: EF 25-30%. diff HK. Gr2 DD; c. 06/2020 Echo: EF <20%, glob HK. RVS 53.74mHg. Sev BAE. Mild to mod TR.   Chronic foot pain (Secondary Area of Pain) (Right) 01/29/2018   COPD (chronic obstructive pulmonary disease) (HCC)    COPD with acute exacerbation ([place] 01/30/2020   Coronary artery disease    a. 2013 S/P PCI of LAD ([facility];  b. 03/2013 PCI: RCA 90p ( 2.5 x 23 mm DES); c. 02/2014 Cath: patent RCA stent->Med Rx; d. 04/2017 Cath: LM nl, LAd 30ost/p, 610m, D1 80ost, LCX 90p/m, OM1 85, RCA 70/20p, 8098m0d->Referred for CT Surg-felt to be poor candidate; d. 01/2020 Cath: LM nl, LAD 20p/m, 60d. D1 80. LCX 90p/m. LPAV 95, RCA 80/20p, 80p/m.   Diabetic neuropathy ([place]  Diabetic ulcer of right foot ([place]CC[place]  [place]    HFrEF (heart failure with reduced ejection fraction) ([place]8/22/2021   Hyperlipidemia    Hypertension    Ischemic cardiomyopathy    a. 02/2013 EF 50% by LV gram; b. 04/2017 Echo: EF 25-30%. diff  HK. Gr2 DD; c. 06/2020 Echo: EF <20%, glob HK. RVS 53.2mm64m Sev BAE. Mild to mod TR.   Morbid obesity (HCC)[place] Neuropathy    PAD (peripheral artery disease) (HCC)[place] a. Followed by Dr. Dew;[place] 08/2010 Periph Angio: RSFA 70-80p (6X60 self-expanding stent); c. 09/2010 Periph Angio: L SFA 100p (7x unknown length self-expanding stent); d. 06/2015 Periph Angio: R SFA short segment occlusion (6x12 self-expanding stent).   Persistent atrial fibrillation (HCC)    Restless leg syndrome  Sepsis ([place]) 03/30/2018   Sleep apnea    Subclavian artery stenosis, right (HCC)    Syncope and collapse    Tobacco use    a. 75+ yr hx - still smoking 1ppd, down from 2 ppd.   Type II diabetes mellitus (HCC)    Unstable angina ([place]) 07/11/2016   Varicose vein    Social History   Socioeconomic History   Marital status: Significant Other    Spouse name: Not on file   Number of children: 0   Years of education: 43   Highest education level: 12th grade  Occupational History   Occupation: on disability  Tobacco Use   Smoking status: Every Day    Packs/day: 1.00    Years: 41.00    Pack years: 41.00    Types: Cigarettes   Smokeless tobacco: Never  Vaping Use   Vaping Use: Never used  Substance and Sexual Activity   Alcohol use: No   Drug use: No   Sexual activity: Yes  Other Topics Concern   Not on file  Social History Narrative   Lives at home in [place] with girlfriend. Independent at baseline.   Social Determinants of Health   Financial Resource Strain: Not on file  Food Insecurity: Not on file  Transportation Needs: Not on file  Physical Activity: Not on file  Stress: Not on file  Social Connections: Not on file   Family History  Problem Relation Age of Onset   Heart attack Father    Heart disease Father    Hypertension Father    Hyperlipidemia Father    Hypertension Mother    Hyperlipidemia Mother    Scheduled Meds:  apixaban  5 mg Oral BID   atorvastatin  80 mg Oral Daily    Chlorhexidine Gluconate Cloth  6 each Topical Daily   clopidogrel  75 mg Oral Daily   docusate sodium  100 mg Oral Daily   DULoxetine  60 mg Oral Daily   ezetimibe  10 mg Oral Daily   fluticasone furoate-vilanterol  1 puff Inhalation Daily   And   umeclidinium bromide  1 puff Inhalation Daily   furosemide  80 mg Intravenous BID   gabapentin  800 mg Oral QID   insulin aspart  0-15 Units Subcutaneous TID WC   insulin aspart  0-5 Units Subcutaneous QHS   multivitamin with minerals  1 tablet Oral Daily   nicotine  21 mg Transdermal Daily   pantoprazole  40 mg Oral Daily   sodium chloride flush  10-40 mL Intracatheter Q12H   tamsulosin  0.4 mg Oral QPC supper   Continuous Infusions:  sodium chloride Stopped (07/17/21 1949)   milrinone 0.25 mcg/kg/min (07/20/21 1300)   PRN Meds:.acetaminophen **OR** acetaminophen, albuterol, ipratropium-albuterol, nitroGLYCERIN, oxyCODONE, senna-docusate Medications Prior to Admission:  Prior to Admission medications   Medication Sig Start Date End Date Taking? Authorizing Provider  albuterol (VENTOLIN HFA) 108 (90 Base) MCG/ACT inhaler Inhale into the lungs every 6 (six) hours as needed for wheezing or shortness of breath.   Yes [provider]  apixaban (ELIQUIS) 5 MG TABS tablet Take 5 mg by mouth 2 (two) times daily.   Yes [provider]  atorvastatin (LIPITOR) 80 MG tablet TAKE 1 TABLET BY MOUTH ONCE DAILY 06/12/21  Yes Gollan, Kathlene November, MD  carvedilol (COREG) 3.125 MG tablet TAKE 1 TABLET BY MOUTH TWICE DAILY WITH MEALS 06/12/21  Yes Gollan, Kathlene November, MD  clopidogrel (PLAVIX) 75 MG tablet TAKE 1 TABLET  BY MOUTH ONCE DAILY 06/12/21  Yes Gollan, Kathlene November, MD  digoxin (LANOXIN) 0.125 MG tablet TAKE 1 TABLET BY MOUTH ONCE DAILY 06/12/21  Yes Gollan, Kathlene November, MD  DULoxetine (CYMBALTA) 60 MG capsule Take 60 mg by mouth daily.   Yes [provider]  ezetimibe (ZETIA) 10 MG tablet Take 1 tablet (10 mg total) by mouth daily. 10/09/20   Yes Minna Merritts, MD  gabapentin (NEURONTIN) 800 MG tablet Take 800 mg by mouth 4 (four) times daily.  08/14/17 04/04/29 Yes [provider]  ipratropium-albuterol (DUONEB) 0.5-2.5 (3) MG/3ML SOLN Inhale 3 mLs into the lungs every 6 (six) hours as needed (wheezing/sob).    Yes [provider]  losartan (COZAAR) 25 MG tablet Take 0.5 tablets (12.5 mg total) by mouth daily. 10/09/20  Yes Minna Merritts, MD  metFORMIN (GLUCOPHAGE-XR) 500 MG 24 hr tablet Take 1,000 mg by mouth 2 (two) times daily. 01/02/21  Yes [provider]  Multiple Vitamin (MULTI-VITAMINS) TABS Take 1 tablet by mouth daily.    Yes [provider]  omeprazole (PRILOSEC) 40 MG capsule Take 40 mg by mouth daily.   Yes [provider]  Oxycodone HCl 10 MG TABS Take 10 mg by mouth every 4 (four) hours as needed (pain).   Yes [provider]  polyethylene glycol (MIRALAX / GLYCOLAX) 17 g packet Take 17 g by mouth daily as needed for mild constipation. 08/12/20  Yes Wieting, Richard, MD  potassium chloride (KLOR-CON) 10 MEQ tablet TAKE 1 TABLET BY MOUTH TWICE DAILY 06/12/21  Yes Gollan, Kathlene November, MD  promethazine (PHENERGAN) 25 MG tablet Take 25 mg by mouth every 8 (eight) hours as needed. 04/02/21  Yes [provider]  spironolactone (ALDACTONE) 25 MG tablet Take 0.5 tablets (12.5 mg total) by mouth daily. 10/09/20  Yes Minna Merritts, MD  tamsulosin (FLOMAX) 0.4 MG CAPS capsule Take 1 capsule (0.4 mg total) by mouth daily after supper. 07/01/20  Yes Shawna Clamp, MD  traZODone (DESYREL) 150 MG tablet Take 150 mg by mouth at bedtime. 07/12/19  Yes [provider]  VERQUVO 10 MG TABS TAKE 1 TABLET BY MOUTH ONCE DAILY 11/16/20  Yes Alisa Graff, FNP  Fluticasone-Umeclidin-Vilant 100-62.5-25 MCG/INH AEPB Inhale 1 puff into the lungs daily.    [provider]  nitroGLYCERIN (NITROSTAT) 0.4 MG SL tablet Place 1 tablet (0.4 mg total) under the tongue every 5  (five) minutes as needed for chest pain. 10/09/20   Minna Merritts, MD  torsemide 40 MG TABS Take 40 mg by mouth 2 (two) times daily. Take 40 mg by mouth twice daily. 03/30/21 07/11/21  Sidney Ace, MD   Allergies  Allergen Reactions   Dulaglutide Anaphylaxis, Diarrhea and Hives   Ace Inhibitors     hypotension   Other Itching    Skin itching associated with nitro patch   Prednisone Rash   Review of Systems  Unable to perform ROS  Physical Exam Pulmonary:     Effort: Pulmonary effort is normal.  Neurological:     Mental Status: He is alert.  Psychiatric:     Comments: Appears restless and agitated    Vital Signs: BP 101/83    Pulse (!) 110    Temp (!) 96.4 F (35.8 C) (Oral)    Resp 17    Ht 5' 7"  (1.702 m)    Wt 94.3 kg    SpO2 99%    BMI 32.56 kg/m  Pain Scale: 0-10  POSS *See Group Information*: 1-Acceptable,Awake and alert Pain Score: 0-No pain   SpO2: SpO2: 99 % O2 Device:SpO2: 99 % O2 Flow Rate: .O2 Flow Rate (L/min): 4 L/min  IO: Intake/output summary:  Intake/Output Summary (Last 24 hours) at 07/20/2021 1455 Last data filed at 07/20/2021 1300 Gross per 24 hour  Intake 688.06 ml  Output 1305 ml  Net -616.94 ml    LBM: Last BM Date:  (Prior to assessment) Baseline Weight: Weight: 81.6 kg Most recent weight: Weight: 94.3 kg        Time In: 2:00 Time Out: 2:30 Time Total: 30 min Greater than 50%  of this time was spent counseling and coordinating care related to the above assessment and plan.  Signed by: Asencion Gowda, NP   Please contact Palliative Medicine Team phone at [phone] for questions and concerns.  For individual provider: See Shea Evans

## 2021-07-21 DIAGNOSIS — I5023 Acute on chronic systolic (congestive) heart failure: Secondary | ICD-10-CM | POA: Diagnosis not present

## 2021-07-21 LAB — GLUCOSE, CAPILLARY: Glucose-Capillary: 105 mg/dL — ABNORMAL HIGH (ref 70–99)

## 2021-07-21 MED ORDER — ALUM & MAG HYDROXIDE-SIMETH 200-200-20 MG/5ML PO SUSP
30.0000 mL | Freq: Four times a day (QID) | ORAL | Status: DC | PRN
  Administered 2021-07-21: 30 mL via ORAL
  Filled 2021-07-21: qty 30

## 2021-07-21 MED ORDER — MORPHINE 100MG IN NS 100ML (1MG/ML) PREMIX INFUSION
2.0000 mg/h | INTRAVENOUS | Status: DC
  Administered 2021-07-21: 2 mg/h via INTRAVENOUS
  Filled 2021-07-21: qty 100

## 2021-07-21 MED ORDER — OXYCODONE HCL 10 MG PO TABS: 10.0000 mg | ORAL_TABLET | ORAL | 0 refills | Status: AC | PRN

## 2021-07-21 NOTE — TOC Progression Note (Signed)
Transition of Care [facility]) - Progression Note    Patient Details  Name: [name] [name] MRN: [id number] Date of Birth: 1961/05/16  [facility]) CM/SW Contact  Erasmo Vertz, [place], [place] Phone Number: 07/21/2021, 3:56 PM  Clinical Narrative:    Patient to discharge home today with Hospice services. Oxygen arranged by Hospice and will be delivered today before discharge., Patient to be transported by EMS. EMS contacted and is on the list for pick up.  [address], LCSW Transition of Care [phone]   Expected Discharge Plan: Home w Hospice Care Barriers to Discharge: Equipment Delay  Expected Discharge Plan and Services Expected Discharge Plan: [place]   Discharge Planning Services: CM Consult Post Acute Care Choice: Hospice Living arrangements for the past 2 months: Single Family Home Expected Discharge Date: 07/21/21                                     Social Determinants of Health (SDOH) Interventions    Readmission Risk Interventions Readmission Risk Prevention Plan 07/18/2021 06/16/2021 03/20/2021  Transportation Screening Complete Complete Complete  HRI or [place] Work Consult for [place] Planning/Counseling - - -  SW consult not completed comments - - -  Palliative Care Screening - - -  Medication Review Press photographer) Complete Complete Complete  PCP or Specialist appointment within 3-5 days of discharge Complete Complete Complete  HRI or [place] Complete Complete Complete  SW Recovery Care/Counseling Consult Complete Complete Complete  Palliative Care Screening Not Applicable Not Applicable Not [place] Not Applicable Not Applicable Not Applicable  Some recent data might be hidden

## 2021-07-21 NOTE — Progress Notes (Signed)
Chaplain On-Call received a call from [employer], who reported that the patient and family are asking to make a Will.  Chaplain met patient and his partner [name]. [name] stated that the patient's wants to make his Last Will and Testament.  Chaplain explained to [name] and the patient that the [place] authorize documents only for health care. The Last Will and Testament would need to be managed by an [place].  Patient and [name] stated their understanding.  Chaplain Pollyann Samples M.Div., [facility]

## 2021-07-21 NOTE — Progress Notes (Signed)
[place] [facility] [place] NAME: [name] [name]    MR#:  [id number]  DATE OF BIRTH:  10-Mar-1961  SUBJECTIVE:  Patient remains lethargic  Having some jerky movements.  Unable to hold any conversation Gasping for air VITALS:  Blood pressure 122/85, pulse 98, temperature 98.9 F (37.2 C), temperature source Oral, resp. rate 13, height 5\' 7"  (1.702 m), weight 94.3 kg, SpO2 99 %.  PHYSICAL EXAMINATION:   GENERAL:  61 y.o.-year-old patient lying in the bed  Chronically ill, lethargic, critically ill gasping for air LUNGS: coarse and wet breath sounds bilaterally, no wheezing, +rales, no rhonchi.  CARDIOVASCULAR: S1, S2 normal. No murmurs, rubs, or gallops.  EXTREMITIES: right below knee amputation. Left ankle chronic ulcer NEUROLOGIC: nonfocal, jerky movements, lethargic, unresponsive  LABORATORY PANEL:  CBC Recent Labs  Lab 07/20/21 0430  WBC 7.0  HGB 8.7*  HCT 26.6*  PLT 131*     Chemistries  Recent Labs  Lab 07/16/21 0432 07/17/21 0603 07/20/21 0430  NA 126*   < > 124*  K 5.2*   < > 3.8  CL 92*   < > 82*  CO2 26   < > 32  GLUCOSE 127*   < > 154*  BUN 45*   < > 53*  CREATININE 1.71*   < > 1.27*  CALCIUM 8.6*   < > 9.0  MG 2.3   < > 2.0  AST 25  --   --   ALT 14  --   --   ALKPHOS 108  --   --   BILITOT 2.3*  --   --    < > = values in this interval not displayed.    Cardiac Enzymes No results for input(s): TROPONINI in the last 168 hours. RADIOLOGY:  No results found.  Assessment and Plan [name] [name] is a 61 y.o. male with medical history significant for HFrEF, CAD, a-fib, COPD, HTN, DMT2, OSA, tobacco use who presented for evaluation of shortness of breath and swelling.     Acute on chronic HFrEF (heart failure with reduced ejection fraction --LVEF<20%.  Patient does not have an AICD. --BNP chronically elevated.  CXR showed Mild pulmonary vascular congestion. No evidence of pulmonary Edema. --Cr worsened with just 1  dose of IV lasix 40 --right and left heart cath 07/17/2021 found to have severely elevated left and right heart filling pressures and severely reduced cardiac output.   -cont iV milrinone and pressor for low BP.   --2/8 --IV lasix 80 mg BID started per [facility] cardiology --2/9--cont iv milrinone. UOP 3liters 2/8. Card considering IV lasix gtt with metolazone --sats 90-93% on RA -- per cardiology not a candidate for AICD, h/o  noncompliance --2/10-- overall mentation decline today. Patient intermittently confused. Girlfriend/H POA [name] in the room. She earlier had discussion with nurse practitioner Asencion Gowda at length. She is aware of patient is very poor prognosis and wants to facilitate patient's wishes of going home with hospice. She understands patient's poor heart condition. Discussed with ICU attending Dr.Aleskerov and is in agreement as well. Discussed with cardiology and they're okay with discontinuing milrinone gtt. -- TOC for hospice services at home --2/10--air hunger, unresponsive, coarse BS. Spoke with patient's girlfriend/HPOA-- overall patient does not seem to be doing well. Discussed with her comfort care and she is in agreement with IV morphine drip. She understands prognosis and currently not stable to transfer home. She tells me she does not want patient  to suffer. Will start morphine drip and transfer patient out of the ICU. Discontinue all PO meds and comfort care orders placed   Acute COPD exacerbation   AKI (acute kidney injury) 2/2 Cardiorenal syndrome  Type 2 diabetes mellitus with hyperlipidemia   Permanent atrial fibrillation   PAD (peripheral artery disease) Chronic. Tobacco use Chronic LLE wounds     DVT prophylaxis: PZ:ZCKICHT care Code Status: DNR Family Communication: girlfriend [name] on the phone today Dispo:   Likely in hospital death   TOTAL TIME TAKING CARE OF THIS PATIENT: 16minutes.  >50% time spent on counselling and coordination of  care  Note: This dictation was prepared with Dragon dictation along with smaller phrase technology. Any transcriptional errors that result from this process are unintentional.  Fritzi Mandes M.D    [facility]   CC: Primary care physician; [name], MD

## 2021-07-21 NOTE — Progress Notes (Signed)
Patient was given verbal and written discharge instructions, (questionable how much he understood) Patient friend [name] was contacted but phone recording was full. Patient left by EMS, DNR was also included in paperwork, no distress when leaving the  floor.

## 2021-07-21 NOTE — Progress Notes (Signed)
[facility])  Referral received for hospice services at home.  Met with patient and spoke with S/O [name], explained services and offered support.  Will need O2 ordered; should be delivered by 4 pm.  Updated hospital team.  Thank you, Venia Carbon BSN, RN [facility] Liaison

## 2021-07-21 NOTE — Progress Notes (Addendum)
NAME:  [name] [name], MRN:  [id number], DOB:  August 07, 1960, LOS: 6 ADMISSION DATE:  07/15/2021, CONSULTATION DATE:  07/17/2021 REFERRING MD:  Dr. Billie Ruddy, CHIEF COMPLAINT:  Shortness of Breath & Edema   Brief Pt Description / Synopsis:  61 y.o. Male admitted with Acute on Chronic HFrEF (LVEF <20%) with Cardiogenic shock and concern for developing Cardiorenal Syndrome.  Being placed on Milrinone and Levophed infusions.  History of Present Illness:  [name] [name] is a 61 year old male with a past medical history significant for severe multivessel CAD, HFrEF (LVEF less than 20%), pulmonary hypertension, permanent atrial fibrillation on Eliquis, hypertension, hyperlipidemia, and chronic kidney disease who presented to [facility] ED on 07/15/2021 due to complaints of shortness of breath and lower extremity edema.  He was seen at the CHF clinic approximately 3 days ago where he appeared to be fluid overloaded with soft blood pressures.  He was referred to the ED at that time for further evaluation, but he refused to seek medical evaluation at that time.  He reported increasing shortness of breath and lower extremity edema, along with associated generalized malaise and fatigue.  He denied fever, chills, abdominal pain, nausea, vomiting, diarrhea, dysuria.  He reported he has been compliant with his home digoxin, Plavix, Eliquis.  It was unclear if he had been taking his diuretic as prescribed.  He also endorses that he continues to smoke, and has increased to 2 packs a day from 1 pack a day in the last 3 to 4 weeks.  Denied alcohol or illicit drug use.  07/20/21- patient remains in CHF , he is being diuresed and is low to respond. Reviewed care plan with Dr patel today.  I met with palliative specialist and wishes from [name] [name] is to allow patient to go home with hospice.  We discussed this with cardiology and will stop milrinone.   07/21/21- patient remains critically ill.  Despite maximal medical management.  Plan is for hospice  due to failure of medical management. Family is in agreement. Patient is now on comfort measures in Medical ICU. Reviewed plan with Dr Posey Pronto today.   Pertinent  Medical History  Severe multivessel coronary artery disease Chronic HFrEF (LVEF less than 20%) Permanent atrial fibrillation on Eliquis Peripheral artery disease status post right BKA Hypertension Hyperlipidemia COPD Pulmonary artery hypertension Sleep apnea Chronic kidney disease Diabetes mellitus type 2 BPH Arthritis Diabetic neuropathy  Micro Data:  2/5: SARS-CoV-2 and influenza PCR>> negative  Antimicrobials:  N/A  Significant Hospital Events: Including procedures, antibiotic start and stop dates in addition to other pertinent events   2/5: Presented to ED with complaints of shortness of breath and lower extremity edema.  Admitted by the hospitalist 2/6: Cardiology, podiatry and vascular surgery consulted. LLE wounds debrided. Diuretics held due to worsening AKI 2/7: Cardiac catheterization performed due to concern for low output heart failure and developing cardiorenal syndrome.  Returns to the ICU post cath requiring Levophed with plan to place PICC line and start milrinone infusion.  PCCM consulted   Objective   Blood pressure (!) 103/44, pulse 99, temperature 98 F (36.7 C), resp. rate (!) 22, height _0  (1.702 m), weight 94.3 kg, SpO2 91 %. CVP:  [13 mmHg] 13 mmHg      Intake/Output Summary (Last 24 hours) at 07/21/2021 1031 Last data filed at 07/20/2021 2000 Gross per 24 hour  Intake 39.29 ml  Output 1050 ml  Net -1010.71 ml    Filed Weights   07/18/21 0500 07/19/21 0500 07/20/21  0500  Weight: 93.7 kg 96.5 kg 94.3 kg    Examination: General: Acute on chronically ill-appearing male, sitting in bed, on room air, no acute distress HENT: Atraumatic, normocephalic, neck supple, positive JVD Lungs: Fine crackles bilateral upper fields, diminished bilateral lower fields with mild expiratory wheeze,  even, nonlabored, no accessory muscle use Cardiovascular: Regular rate and rhythm, no murmurs, rubs, gallops Abdomen: Obese, soft, nontender, nondistended, no guarding rebound tenderness, bowel sounds positive x4 Extremities: Right BKA, LLE with chronic wounds Neuro: Awake and alert, oriented x3, follows commands, no focal deficits, speech clear GU: Deferred  Resolved Hospital Problem list     Assessment & Plan:   Cardiogenic Shock Acute on Chronic HFrEF (LVEF <20% on Echo 06/15/21) Mildly Elevated Troponin, suspect demand ischemia Permanent A. Fib on Eliquis PMHx: severe CAD, PAD, tobacco use (smokes 2 PPD) -Continuous cardiac monitoring -Maintain MAP >65 -Vasopressors as needed to maintain MAP goal -Cardiology following, appreciate input -Plan to start milrinone infusion per cardiology ~ follow Co-ox panel -Hold antihypertensives -Diuresis as blood pressure and renal function permits ~currently on Lasix 80 mg twice daily -Continue Eliquis for anticoagulation -Echo 06/15/21: LVEF <20%, indeterminate diastolic parameters, RV systolic function severely reduced, severely elevated pulmonary artery systolic pressure -Cardiac Cath 2/7: Severe multivessel coronary artery disease, similar to prior catheterization from 01/2020. Severely elevated left heart, right heart, and pulmonary artery pressures. Moderately-severely reduced Fick cardiac output/index.  Acute Kidney Injury on CKD Concern for developing Cardiorenal Syndrome Hyponatremia, suspect Hypotonic Hypervolemic in setting of CHF Hyperkalemia PMHx: BPH -Monitor I&O's / urinary output -Follow BMP -Ensure adequate renal perfusion -Avoid nephrotoxic agents as able -Replace electrolytes as indicated -Inotropes as above -Diuresis as renal function and blood pressure permits -Consider nephrology consult -Continue Flomax  Acute COPD exacerbation PMHx: Pulmonary hypertension -Supplemental O2 as needed to maintain O2 sats 88 to  92% -Follow intermittent chest x-ray and ABG as needed -Continue bronchodilators, Breo Ellipta, Incruse Ellipta -Continue prednisone 40 mg daily  LLE wound PMHx: PAD s/p BKA of RLE -Continue Plavix and Lipitor -Podiatry following, appreciate input ~ s/p Debridement of wounds on 2/6, further wound care as per Podiatry -Vascular Surgery following, appreciate input ~ tentative plan for angiogram on Wednesday 2/8  Diabetes Mellitus Type II -CBG's ac &hs ; Target range of 140 to 180 -SSI -Follow ICU Hypo/Hyperglycemia protocol     Pt is critically ill with severe HFrEF and concern for developing Cardiorenal syndrome, superimposed on multiple chronic co-morbidities.  Being placed on inotropes.  Prognosis is guarded, high risk for cardiac arrest and death.  Overall long term prognosis is poor.  Recommend DNR status.  Consider Palliative Care consult.  Best Practice (right click and "Reselect all SmartList Selections" daily)   Diet/type: Regular consistency (see orders) DVT prophylaxis: DOAC GI prophylaxis: PPI Lines: N/A Foley:  N/A Code Status:  full code Last date of multidisciplinary goals of care discussion [N/A]  Pt's significant other updated at bedside 07/17/21.  All questions answered to their satisfaction.  Labs   CBC: Recent Labs  Lab 07/16/21 0432 07/17/21 0603 07/18/21 0526 07/19/21 0526 07/20/21 0430  WBC 3.9* 8.2 6.5 6.0 7.0  HGB 9.1* 9.1* 8.3* 8.2* 8.7*  HCT 28.2* 27.8* 25.9* 25.0* 26.6*  MCV 77.7* 78.8* 78.2* 75.3* 75.1*  PLT 149* 184 121* 128* 131*     Basic Metabolic Panel: Recent Labs  Lab 07/16/21 0432 07/17/21 0603 07/17/21 1811 07/18/21 0526 07/19/21 0526 07/20/21 0430  NA 126* 121* 121* 120* 122* 124*  K 5.2*  5.5* 5.4* 4.7 4.2 3.8  CL 92* 87* 86* 84* 82* 82*  CO2 _0 32  GLUCOSE 127* 162* 252* 219* 182* 154*  BUN 45* 62* 65* 64* 56* 53*  CREATININE 1.71* 2.02* 2.06* 1.78* 1.49* 1.27*  CALCIUM 8.6* 8.5* 8.7* 8.5* 8.7* 9.0  MG  2.3 2.4  --  2.3 2.3 2.0    GFR: Estimated Creatinine Clearance: 69.4 mL/min (A) (by C-G formula based on SCr of 1.27 mg/dL (H)). Recent Labs  Lab 07/15/21 1640 07/16/21 0432 07/17/21 0603 07/18/21 0526 07/19/21 0526 07/20/21 0430  PROCALCITON <0.10 0.11  --   --   --   --   WBC 5.6 3.9* 8.2 6.5 6.0 7.0     Liver Function Tests: Recent Labs  Lab 07/15/21 1640 07/16/21 0432  AST 32 25  ALT 14 14  ALKPHOS 108 108  BILITOT 2.9* 2.3*  PROT 6.5 6.1*  ALBUMIN 3.2* 3.3*    No results for input(s): LIPASE, AMYLASE in the last 168 hours. No results for input(s): AMMONIA in the last 168 hours.  ABG    Component Value Date/Time   HCO3 36.4 (H) 08/30/2019 2158   O2SAT 39.2 07/20/2021 0444      Coagulation Profile: Recent Labs  Lab 07/15/21 1640  INR 1.4*     Cardiac Enzymes: No results for input(s): CKTOTAL, CKMB, CKMBINDEX, TROPONINI in the last 168 hours.  HbA1C: Hemoglobin A1C  Date/Time Value Ref Range Status  04/04/2013 04:16 AM 9.8 (H) 4.2 - 6.3 % Final    Comment:    The American Diabetes Association recommends that a primary goal of therapy should be <7% and that physicians should reevaluate the treatment regimen in patients with HbA1c values consistently >8%.    Hgb A1c MFr Bld  Date/Time Value Ref Range Status  06/17/2021 05:56 AM 6.1 (H) 4.8 - 5.6 % Final    Comment:    (NOTE) Pre diabetes:          5.7%-6.4%  Diabetes:              >6.4%  Glycemic control for   <7.0% adults with diabetes   03/18/2021 11:10 AM 6.2 (H) 4.8 - 5.6 % Final    Comment:    (NOTE)         Prediabetes: 5.7 - 6.4         Diabetes: >6.4         Glycemic control for adults with diabetes: <7.0     CBG: Recent Labs  Lab 07/20/21 0730 07/20/21 1141 07/20/21 1628 07/20/21 2153 07/21/21 0747  GLUCAP 150* 151* 97 86 105*     Review of Systems:   Positives in BOLD: Gen: Denies fever, chills, weight change, fatigue, night sweats HEENT: Denies blurred  vision, double vision, hearing loss, tinnitus, sinus congestion, rhinorrhea, sore throat, neck stiffness, dysphagia PULM: Denies shortness of breath, cough, sputum production, hemoptysis, wheezing CV: Denies chest pain, edema, orthopnea, paroxysmal nocturnal dyspnea, palpitations GI: Denies abdominal pain, nausea, vomiting, diarrhea, hematochezia, melena, constipation, change in bowel habits GU: Denies dysuria, hematuria, polyuria, oliguria, urethral discharge Endocrine: Denies hot or cold intolerance, polyuria, polyphagia or appetite change Derm: Denies rash, dry skin, scaling or peeling skin change Heme: Denies easy bruising, bleeding, bleeding gums Neuro: Denies headache, numbness, weakness, slurred speech, loss of memory or consciousness   Past Medical History:  He,  has a past medical history of Acute on chronic heart failure ([place]), AKI (acute kidney injury) ([place]) (01/03/2018),  Angina at rest [facility]) (08/13/2017), Arthritis, Asthma, Atherosclerosis, BPH (benign prostatic hyperplasia), Carotid arterial disease ([place]), Cellulitis of foot, left (09/27/2019), Charcot's joint of foot, right, Chronic combined systolic (congestive) and diastolic (congestive) heart failure ([place]), Chronic foot pain (Secondary Area of Pain) (Right) (01/29/2018), COPD (chronic obstructive pulmonary disease) ([place]), COPD with acute exacerbation ([place]) (01/30/2020), Coronary artery disease, Diabetic neuropathy ([place]), Diabetic ulcer of right foot ([place]), Hernia, HFrEF (heart failure with reduced ejection fraction) ([place]) (01/30/2020), Hyperlipidemia, Hypertension, Ischemic cardiomyopathy, Morbid obesity ([place]), Neuropathy, PAD (peripheral artery disease) ([place]), Persistent atrial fibrillation ([place]), Restless leg syndrome, Sepsis ([place]) (03/30/2018), Sleep apnea, Subclavian artery stenosis, right ([place]), Syncope and collapse, Tobacco use, Type II diabetes mellitus ([place]), Unstable angina ([place]) (07/11/2016), and Varicose vein.   Surgical  History:   Past Surgical History:  Procedure Laterality Date   ABDOMINAL AORTAGRAM N/A 06/23/2013   Procedure: ABDOMINAL Maxcine Ham;  Surgeon: Wellington Hampshire, MD;  Location: [place] CATH LAB;  Service: Cardiovascular;  Laterality: N/A;   AMPUTATION Right 04/01/2018   Procedure: AMPUTATION BELOW KNEE;  Surgeon: Algernon Huxley, MD;  Location: [facility] ORS;  Service: Vascular;  Laterality: Right;   AMPUTATION TOE Left 08/11/2020   Procedure: AMPUTATION TOE-2nd Toe;  Surgeon: Samara Deist, DPM;  Location: [facility] ORS;  Service: Podiatry;  Laterality: Left;   APPENDECTOMY     CARDIAC CATHETERIZATION  10/14   [place]; X1 STENT PROXIMAL RCA   CARDIAC CATHETERIZATION  02/2010   [facility]   CARDIAC CATHETERIZATION  02/21/2014   [facility]   CLAVICLE SURGERY     HERNIA REPAIR     IRRIGATION AND DEBRIDEMENT FOOT Right 01/04/2018   Procedure: IRRIGATION AND DEBRIDEMENT FOOT;  Surgeon: Samara Deist, DPM;  Location: [facility] ORS;  Service: Podiatry;  Laterality: Right;   LEFT HEART CATH AND CORONARY ANGIOGRAPHY N/A 01/20/2020   Procedure: LEFT HEART CATH AND CORONARY ANGIOGRAPHY;  Surgeon: Wellington Hampshire, MD;  Location: [place] CV LAB;  Service: Cardiovascular;  Laterality: N/A;   LEFT HEART CATH AND CORS/GRAFTS ANGIOGRAPHY N/A 04/28/2017   Procedure: LEFT HEART CATH AND CORONARY ANGIOGRAPHY;  Surgeon: Wellington Hampshire, MD;  Location: [place] CV LAB;  Service: Cardiovascular;  Laterality: N/A;   LOWER EXTREMITY ANGIOGRAPHY Right 03/03/2018   Procedure: LOWER EXTREMITY ANGIOGRAPHY;  Surgeon: Katha Cabal, MD;  Location: [place] CV LAB;  Service: Cardiovascular;  Laterality: Right;   LOWER EXTREMITY ANGIOGRAPHY Left 08/24/2018   Procedure: LOWER EXTREMITY ANGIOGRAPHY;  Surgeon: Algernon Huxley, MD;  Location: [place] CV LAB;  Service: Cardiovascular;  Laterality: Left;   LOWER EXTREMITY ANGIOGRAPHY Left 06/14/2019   Procedure: LOWER EXTREMITY ANGIOGRAPHY;  Surgeon: Algernon Huxley, MD;  Location: [place]  CV LAB;  Service: Cardiovascular;  Laterality: Left;   LOWER EXTREMITY ANGIOGRAPHY Left 08/02/2019   Procedure: LOWER EXTREMITY ANGIOGRAPHY;  Surgeon: Algernon Huxley, MD;  Location: [place] CV LAB;  Service: Cardiovascular;  Laterality: Left;   LOWER EXTREMITY ANGIOGRAPHY Left 07/17/2020   Procedure: LOWER EXTREMITY ANGIOGRAPHY;  Surgeon: Algernon Huxley, MD;  Location: [place] CV LAB;  Service: Cardiovascular;  Laterality: Left;   PERIPHERAL ARTERIAL STENT GRAFT     x2 left/right   PERIPHERAL VASCULAR BALLOON ANGIOPLASTY Right 01/06/2018   Procedure: PERIPHERAL VASCULAR BALLOON ANGIOPLASTY;  Surgeon: Katha Cabal, MD;  Location: [place] CV LAB;  Service: Cardiovascular;  Laterality: Right;   PERIPHERAL VASCULAR CATHETERIZATION N/A 07/06/2015   Procedure: Abdominal Aortogram w/Lower Extremity;  Surgeon: Algernon Huxley, MD;  Location: [place] INVASIVE CV  LAB;  Service: Cardiovascular;  Laterality: N/A;   PERIPHERAL VASCULAR CATHETERIZATION  07/06/2015   Procedure: Lower Extremity Intervention;  Surgeon: Algernon Huxley, MD;  Location: [place] CV LAB;  Service: Cardiovascular;;   RIGHT HEART CATH AND CORONARY ANGIOGRAPHY N/A 07/17/2021   Procedure: RIGHT HEART CATH AND CORONARY ANGIOGRAPHY;  Surgeon: Nelva Bush, MD;  Location: [place] CV LAB;  Service: Cardiovascular;  Laterality: N/A;     Social History:   reports that he has been smoking cigarettes. He has a 41.00 pack-year smoking history. He has never used smokeless tobacco. He reports that he does not drink alcohol and does not use drugs.   Family History:  His family history includes Heart attack in his father; Heart disease in his father; Hyperlipidemia in his father and mother; Hypertension in his father and mother.   Allergies Allergies  Allergen Reactions   Dulaglutide Anaphylaxis, Diarrhea and Hives   Ace Inhibitors     hypotension   Other Itching    Skin itching associated with nitro patch   Prednisone Rash      Home Medications  Prior to Admission medications   Medication Sig Start Date End Date Taking? Authorizing Provider  albuterol (VENTOLIN HFA) 108 (90 Base) MCG/ACT inhaler Inhale into the lungs every 6 (six) hours as needed for wheezing or shortness of breath.   Yes [provider]  apixaban (ELIQUIS) 5 MG TABS tablet Take 5 mg by mouth 2 (two) times daily.   Yes [provider]  atorvastatin (LIPITOR) 80 MG tablet TAKE 1 TABLET BY MOUTH ONCE DAILY 06/12/21  Yes Gollan, Kathlene November, MD  carvedilol (COREG) 3.125 MG tablet TAKE 1 TABLET BY MOUTH TWICE DAILY WITH MEALS 06/12/21  Yes Gollan, Kathlene November, MD  clopidogrel (PLAVIX) 75 MG tablet TAKE 1 TABLET BY MOUTH ONCE DAILY 06/12/21  Yes Gollan, Kathlene November, MD  digoxin (LANOXIN) 0.125 MG tablet TAKE 1 TABLET BY MOUTH ONCE DAILY 06/12/21  Yes Gollan, Kathlene November, MD  DULoxetine (CYMBALTA) 60 MG capsule Take 60 mg by mouth daily.   Yes [provider]  ezetimibe (ZETIA) 10 MG tablet Take 1 tablet (10 mg total) by mouth daily. 10/09/20  Yes Minna Merritts, MD  gabapentin (NEURONTIN) 800 MG tablet Take 800 mg by mouth 4 (four) times daily.  08/14/17 04/04/29 Yes [provider]  ipratropium-albuterol (DUONEB) 0.5-2.5 (3) MG/3ML SOLN Inhale 3 mLs into the lungs every 6 (six) hours as needed (wheezing/sob).    Yes [provider]  losartan (COZAAR) 25 MG tablet Take 0.5 tablets (12.5 mg total) by mouth daily. 10/09/20  Yes Minna Merritts, MD  metFORMIN (GLUCOPHAGE-XR) 500 MG 24 hr tablet Take 1,000 mg by mouth 2 (two) times daily. 01/02/21  Yes [provider]  Multiple Vitamin (MULTI-VITAMINS) TABS Take 1 tablet by mouth daily.    Yes [provider]  omeprazole (PRILOSEC) 40 MG capsule Take 40 mg by mouth daily.   Yes [provider]  Oxycodone HCl 10 MG TABS Take 10 mg by mouth every 4 (four) hours as needed (pain).   Yes [provider]  polyethylene glycol (MIRALAX / GLYCOLAX) 17 g  packet Take 17 g by mouth daily as needed for mild constipation. 08/12/20  Yes Wieting, Richard, MD  potassium chloride (KLOR-CON) 10 MEQ tablet TAKE 1 TABLET BY MOUTH TWICE DAILY 06/12/21  Yes Gollan, Kathlene November, MD  promethazine (PHENERGAN) 25 MG tablet Take 25 mg by mouth every 8 (eight) hours as needed.  04/02/21  Yes [provider]  spironolactone (ALDACTONE) 25 MG tablet Take 0.5 tablets (12.5 mg total) by mouth daily. 10/09/20  Yes Minna Merritts, MD  tamsulosin (FLOMAX) 0.4 MG CAPS capsule Take 1 capsule (0.4 mg total) by mouth daily after supper. 07/01/20  Yes Shawna Clamp, MD  traZODone (DESYREL) 150 MG tablet Take 150 mg by mouth at bedtime. 07/12/19  Yes [provider]  VERQUVO 10 MG TABS TAKE 1 TABLET BY MOUTH ONCE DAILY 11/16/20  Yes Alisa Graff, FNP  Fluticasone-Umeclidin-Vilant 100-62.5-25 MCG/INH AEPB Inhale 1 puff into the lungs daily.    [provider]  nitroGLYCERIN (NITROSTAT) 0.4 MG SL tablet Place 1 tablet (0.4 mg total) under the tongue every 5 (five) minutes as needed for chest pain. 10/09/20   Minna Merritts, MD  torsemide 40 MG TABS Take 40 mg by mouth 2 (two) times daily. Take 40 mg by mouth twice daily. 03/30/21 07/11/21  Sidney Ace, MD     Critical care provider statement:   Total critical care time: 33 minutes   Performed by: Lanney Gins MD   Critical care time was exclusive of separately billable procedures and treating other patients.   Critical care was necessary to treat or prevent imminent or life-threatening deterioration.   Critical care was time spent personally by me on the following activities: development of treatment plan with patient and/or surrogate as well as nursing, discussions with consultants, evaluation of patient's response to treatment, examination of patient, obtaining history from patient or surrogate, ordering and performing treatments and interventions, ordering and review of laboratory studies, ordering  and review of radiographic studies, pulse oximetry and re-evaluation of patient's condition.    Ottie Glazier, M.D.  Pulmonary & Critical Care Medicine

## 2021-07-21 NOTE — TOC Transition Note (Signed)
Transition of Care [facility]) - CM/SW Discharge Note   Patient Details  Name: [name] [name] MRN: [id number] Date of Birth: 28-Aug-1960  Transition of Care [facility]) CM/SW Contact:  Elliot Gurney [place], [place] Phone Number: 07/21/2021, 1:51 PM   Clinical Narrative:    Patient to discharge home with hospice today. Phone call to [place], spoke with Venia Carbon to confirm that she will arrange Oxygen for patient before discharge home. Transportation to be arranged through [facility] for transport home when ready.    Mayah Urquidi, LCSW Transition of Care [phone]      Barriers to Discharge: Equipment Delay   Patient Goals and CMS Choice Patient states their goals for this hospitalization and ongoing recovery are:: girlfriend wants patient to die at home with hospice CMS Medicare.gov Compare Post Acute Care list provided to:: Patient Represenative (must comment) Choice offered to / list presented to : [place] / [place]  Discharge Placement                       Discharge Plan and Services   Discharge Planning Services: CM Consult Post Acute Care Choice: Hospice                               Social Determinants of Health (SDOH) Interventions     Readmission Risk Interventions Readmission Risk Prevention Plan 07/18/2021 06/16/2021 03/20/2021  Transportation Screening Complete Complete Complete  HRI or [place] Work Consult for [place] consult not completed comments - - -  Palliative Care Screening - - -  Medication Review Press photographer) Complete Complete Complete  PCP or Specialist appointment within 3-5 days of discharge Complete Complete Complete  HRI or Home Care Consult Complete Complete Complete  SW Recovery Care/Counseling Consult Complete Complete Complete  Palliative Care Screening Not Applicable Not Applicable Not [place] Not Applicable Not Applicable Not  Applicable  Some recent data might be hidden

## 2021-07-21 NOTE — Progress Notes (Signed)
Discussed course of clinical development with family briefly. comfort measures as now the goal.  He did not respond to maximal medical therapies.  No plans for intervention on Monday.

## 2021-07-21 NOTE — Discharge Summary (Signed)
Physician Discharge Summary   Patient: [name] [name] MRN: [id number] DOB: [birth year]  Admit date:     07/15/2021  Discharge date: 07/21/21  Discharge Physician: Fritzi Mandes   PCP: [name], MD   Recommendations at discharge:   use your oxygen per instruction Use your nebulizer and inhalers as needed  Discharge Diagnoses: acute on chronic heart failure with reduced ejection fraction less than 20% severe cardiomyopathy  Hospital Course: SHOTA KOHRS is a 61 y.o. male with medical history significant for HFrEF, CAD, a-fib, COPD, HTN, DMT2, OSA, tobacco use who presented for evaluation of shortness of breath and swelling.     Acute on chronic HFrEF (heart failure with reduced ejection fraction --LVEF<20%.  Patient does not have an AICD. --BNP chronically elevated.  CXR showed Mild pulmonary vascular congestion. No evidence of pulmonary Edema. --Cr worsened with just 1 dose of IV lasix 40 --right and left heart cath 07/17/2021 found to have severely elevated left and right heart filling pressures and severely reduced cardiac output.   -cont iV milrinone and pressor for low BP.   --2/8 --IV lasix 80 mg BID started per [facility] cardiology --2/9--iv milrinone. UOP 3liters 2/8. Card considering IV lasix gtt with metolazone --sats 90-93% on RA -- per cardiology not a candidate for AICD, h/o  noncompliance --2/10-- overall mentation decline today. Patient intermittently confused. Girlfriend/H POA [name] in the room. She earlier had discussion with nurse practitioner Asencion Gowda at length. She is aware of patient is very poor prognosis and wants to facilitate patient's wishes of going home with hospice. She understands patient's poor heart condition. Discussed with ICU attending Dr.Aleskerov and is in agreement as well. Discussed with cardiology and they're okay with discontinuing milrinone gtt. -- TOC for hospice services at home --2/10--air hunger, unresponsive, coarse BS. Spoke with  patient's girlfriend/HPOA-- overall patient does not seem to be doing well. Discussed with her comfort care and she is in agreement with IV morphine drip. She understands prognosis and currently not stable to transfer home. She tells me she does not want patient to suffer. Will start morphine drip and transfer patient out of the ICU. Discontinue all PO meds and comfort care orders placed  went back to check patient. Patient was enjoying a meal with his family. Discussed with family again and patient is in agreement to go home with hospice services. As far as his home meds patient states he will take as many he can. [facility]. Oxygen will be set up. D/c morphine gtt  Family pt and girlfriend in agreement.   Acute COPD exacerbation   AKI (acute kidney injury) 2/2 Cardiorenal syndrome  Type 2 diabetes mellitus with hyperlipidemia   Permanent atrial fibrillation   PAD (peripheral artery disease) Chronic. Tobacco use Chronic LLE wounds              Consultants: [facility] cardiology, pulmonary  Disposition: Home Diet recommendation:  Discharge Diet Orders (From admission, onward)     Start     Ordered   07/21/21 0000  Diet - low sodium heart healthy        07/21/21 1235           Cardiac diet  DISCHARGE MEDICATION: Allergies as of 07/21/2021       Reactions   Dulaglutide Anaphylaxis, Diarrhea, Hives   Ace Inhibitors    hypotension   Other Itching   Skin itching associated with nitro patch   Prednisone Rash  Medication List     STOP taking these medications    digoxin 0.125 MG tablet Commonly known as: LANOXIN   metFORMIN 500 MG 24 hr tablet Commonly known as: GLUCOPHAGE-XR   promethazine 25 MG tablet Commonly known as: PHENERGAN       TAKE these medications    albuterol 108 (90 Base) MCG/ACT inhaler Commonly known as: VENTOLIN HFA Inhale into the lungs every 6 (six) hours as needed for wheezing or  shortness of breath.   apixaban 5 MG Tabs tablet Commonly known as: ELIQUIS Take 5 mg by mouth 2 (two) times daily.   atorvastatin 80 MG tablet Commonly known as: LIPITOR TAKE 1 TABLET BY MOUTH ONCE DAILY   carvedilol 3.125 MG tablet Commonly known as: COREG TAKE 1 TABLET BY MOUTH TWICE DAILY WITH MEALS   clopidogrel 75 MG tablet Commonly known as: PLAVIX TAKE 1 TABLET BY MOUTH ONCE DAILY   DULoxetine 60 MG capsule Commonly known as: CYMBALTA Take 60 mg by mouth daily.   ezetimibe 10 MG tablet Commonly known as: ZETIA Take 1 tablet (10 mg total) by mouth daily.   Fluticasone-Umeclidin-Vilant 100-62.5-25 MCG/INH Aepb Inhale 1 puff into the lungs daily.   gabapentin 800 MG tablet Commonly known as: NEURONTIN Take 800 mg by mouth 4 (four) times daily.   ipratropium-albuterol 0.5-2.5 (3) MG/3ML Soln Commonly known as: DUONEB Inhale 3 mLs into the lungs every 6 (six) hours as needed (wheezing/sob).   losartan 25 MG tablet Commonly known as: COZAAR Take 0.5 tablets (12.5 mg total) by mouth daily.   Multi-Vitamins Tabs Take 1 tablet by mouth daily.   nitroGLYCERIN 0.4 MG SL tablet Commonly known as: NITROSTAT Place 1 tablet (0.4 mg total) under the tongue every 5 (five) minutes as needed for chest pain.   omeprazole 40 MG capsule Commonly known as: PRILOSEC Take 40 mg by mouth daily.   Oxycodone HCl 10 MG Tabs Take 1 tablet (10 mg total) by mouth every 4 (four) hours as needed (pain).   polyethylene glycol 17 g packet Commonly known as: MIRALAX / GLYCOLAX Take 17 g by mouth daily as needed for mild constipation.   potassium chloride 10 MEQ tablet Commonly known as: KLOR-CON TAKE 1 TABLET BY MOUTH TWICE DAILY   spironolactone 25 MG tablet Commonly known as: ALDACTONE Take 0.5 tablets (12.5 mg total) by mouth daily.   tamsulosin 0.4 MG Caps capsule Commonly known as: FLOMAX Take 1 capsule (0.4 mg total) by mouth daily after supper.   Torsemide 40 MG  Tabs Take 40 mg by mouth 2 (two) times daily. Take 40 mg by mouth twice daily.   traZODone 150 MG tablet Commonly known as: DESYREL Take 150 mg by mouth at bedtime.   Verquvo 10 MG Tabs Generic drug: Vericiguat TAKE 1 TABLET BY MOUTH ONCE DAILY               Durable Medical Equipment  (From admission, onward)           Start     Ordered   07/21/21 1228  For home use only DME oxygen  Once       Question Answer Comment  Length of Need Lifetime   Mode or (Route) Nasal cannula   Liters per Minute 2   Frequency Continuous (stationary and portable oxygen unit needed)   Oxygen conserving device Yes   Oxygen delivery system Gas      07/21/21 1227  Discharge Care Instructions  (From admission, onward)           Start     Ordered   07/21/21 0000  Discharge wound care:       Comments: Wound care to left and ankle foot full thickness wounds:  Wash foot with soap and water, rinse and dry thoroughly. Paint ulcerations with betadine swab stick and allow to air-dry. When dry, cover with dry gauze, ABD, and secure with Kerlix roll gauze/paper tape. No compression. Place foot into Prevalon boot   07/21/21 1235            Follow-up Information     Samara Deist, DPM. Schedule an appointment as soon as possible for a visit in 2 week(s).   Specialty: Podiatry Why: L foot wound rechecks Contact information: [place] [address] [phone]         [name], MD. Schedule an appointment as soon as possible for a visit.   Specialty: Family Medicine Why: As needed Contact information: [address] [place] [address] [phone]         Minna Merritts, MD .   Specialty: Cardiology Contact information: [place] [address] [phone]                 Discharge Exam: Danley Danker Weights   07/18/21 0500 07/19/21 0500 07/20/21 0500  Weight: 93.7 kg  96.5 kg 94.3 kg     Condition at discharge: poor  The results of significant diagnostics from this hospitalization (including imaging, microbiology, ancillary and laboratory) are listed below for reference.   Imaging Studies: CARDIAC CATHETERIZATION  Result Date: 07/17/2021 Conclusions: Severe multivessel coronary artery disease, similar to prior catheterization from 01/2020. Severely elevated left heart, right heart, and pulmonary artery pressures. Moderately-severely reduced Fick cardiac output/index. Recommendations: Medical management of stable severe coronary artery disease. Transfer to ICU for initiation of milrinone.  PICC line to be placed by IV team for infusion of vasoactive medications as well as hemodynamic monitoring. Discontinue vericiguat due to soft BP and continue to hold carvedilol, losartan, and spironolactone.  Addition of norepinephrine may need to be considered if hypotension worsens. Restart furosemide at 80 mg IV BID (first dose given during catheterization).  Further dose escalation may be necessary based on response to furosemide and milrinone. Restart apixaban 5 mg BID tonight if no evidence of bleeding/vascular injury at catheterization sites. Nelva Bush, MD [facility]  DG Chest Port 1 View  Result Date: 07/17/2021 CLINICAL DATA:  PICC central catheter line placement EXAM: PORTABLE CHEST 1 VIEW COMPARISON:  07/15/2021 05/27/2021 FINDINGS: Cardiac silhouette is again moderately to markedly enlarged. Calcification is again seen within the aortic arch. New left upper extremity PICC tip overlies the superior vena cava/right atrial junction. The right costophrenic angle is not imaged. Unchanged mild bilateral interstitial thickening. There is mild cephalization of the pulmonary vasculature, unchanged. No pleural effusion is seen. No pneumothorax. No acute skeletal abnormality. IMPRESSION: No significant change from 07/15/2021. Possible mild interstitial pulmonary edema.  Moderately to markedly enlarged cardiac silhouette is unchanged from prior. Electronically Signed   By: Yvonne Kendall M.D.   On: 07/17/2021 17:41   DG Chest Portable 1 View  Result Date: 07/15/2021 CLINICAL DATA:  Shortness of breath, volume overload or COPD. EXAM: PORTABLE CHEST 1 VIEW COMPARISON:  Chest radiograph dated in June 14, 2021. FINDINGS: Heart is enlarged. Atherosclerotic calcification of the aortic arch. Pulmonary vascular congestion without  evidence of pulmonary edema or pleural effusion. No focal consolidation. IMPRESSION: 1. Stable cardiomegaly with atherosclerotic calcification of the aortic arch. 2. Mild pulmonary vascular congestion. No evidence of pulmonary edema or infiltrate. Electronically Signed   By: Keane Police D.O.   On: 07/15/2021 16:38   [place] EKG SITE RITE  Result Date: 07/17/2021 If [facility] image not attached, placement could not be confirmed due to current cardiac rhythm.   Microbiology: Results for orders placed or performed during the hospital encounter of 07/15/21  Resp Panel by RT-PCR (Flu A&B, Covid) Nasopharyngeal Swab     Status: None   Collection Time: 07/15/21  4:40 PM   Specimen: Nasopharyngeal Swab; Nasopharyngeal(NP) swabs in vial transport medium  Result Value Ref Range Status   SARS Coronavirus 2 by RT PCR NEGATIVE NEGATIVE Final    Comment: (NOTE) SARS-CoV-2 target nucleic acids are NOT DETECTED.  The SARS-CoV-2 RNA is generally detectable in upper respiratory specimens during the acute phase of infection. The lowest concentration of SARS-CoV-2 viral copies this assay can detect is 138 copies/mL. A negative result does not preclude SARS-Cov-2 infection and should not be used as the sole basis for treatment or other patient management decisions. A negative result may occur with  improper specimen collection/handling, submission of specimen other than nasopharyngeal swab, presence of viral mutation(s) within the areas targeted by this assay,  and inadequate number of viral copies(<138 copies/mL). A negative result must be combined with clinical observations, patient history, and epidemiological information. The expected result is Negative.  Fact Sheet for Patients:  EntrepreneurPulse.com.au  Fact Sheet for Healthcare Providers:  IncredibleEmployment.be  This test is no t yet approved or cleared by the [place] FDA and  has been authorized for detection and/or diagnosis of SARS-CoV-2 by FDA under an Emergency Use Authorization (EUA). This EUA will remain  in effect (meaning this test can be used) for the duration of the COVID-19 declaration under Section 564(b)(1) of the Act, 21 U.S.C.section 360bbb-3(b)(1), unless the authorization is terminated  or revoked sooner.       Influenza A by PCR NEGATIVE NEGATIVE Final   Influenza B by PCR NEGATIVE NEGATIVE Final    Comment: (NOTE) The Xpert Xpress SARS-CoV-2/FLU/RSV plus assay is intended as an aid in the diagnosis of influenza from Nasopharyngeal swab specimens and should not be used as a sole basis for treatment. Nasal washings and aspirates are unacceptable for Xpert Xpress SARS-CoV-2/FLU/RSV testing.  Fact Sheet for Patients: EntrepreneurPulse.com.au  Fact Sheet for Healthcare Providers: IncredibleEmployment.be  This test is not yet approved or cleared by the [place] FDA and has been authorized for detection and/or diagnosis of SARS-CoV-2 by FDA under an Emergency Use Authorization (EUA). This EUA will remain in effect (meaning this test can be used) for the duration of the COVID-19 declaration under Section 564(b)(1) of the Act, 21 U.S.C. section 360bbb-3(b)(1), unless the authorization is terminated or revoked.  Performed at [facility], [address] [place], [place] [address]   MRSA Next Gen by PCR, Nasal     Status: None   Collection Time: 07/17/21  3:22 PM    Specimen: Nasal Mucosa; Nasal Swab  Result Value Ref Range Status   MRSA by PCR Next Gen NOT DETECTED NOT DETECTED Final    Comment: (NOTE) The GeneXpert MRSA Assay (FDA approved for NASAL specimens only), is one component of a comprehensive MRSA colonization surveillance program. It is not intended to diagnose MRSA infection nor to guide or monitor treatment for  MRSA infections. Test performance is not FDA approved in patients less than 35 years old. Performed at [facility], [address] [place], [place] [address]     Labs: CBC: Recent Labs  Lab 07/16/21 [phone] 07/17/21 0603 07/18/21 0526 07/19/21 0526 07/20/21 0430  WBC 3.9* 8.2 6.5 6.0 7.0  HGB 9.1* 9.1* 8.3* 8.2* 8.7*  HCT 28.2* 27.8* 25.9* 25.0* 26.6*  MCV 77.7* 78.8* 78.2* 75.3* 75.1*  PLT 149* 184 121* 128* 412*   Basic Metabolic Panel: Recent Labs  Lab 07/16/21 0432 07/17/21 0603 07/17/21 1811 07/18/21 0526 07/19/21 0526 07/20/21 0430  NA 126* 121* 121* 120* 122* 124*  K 5.2* 5.5* 5.4* 4.7 4.2 3.8  CL 92* 87* 86* 84* 82* 82*  CO2 26 24 26 29 31  32  GLUCOSE 127* 162* 252* 219* 182* 154*  BUN 45* 62* 65* 64* 56* 53*  CREATININE 1.71* 2.02* 2.06* 1.78* 1.49* 1.27*  CALCIUM 8.6* 8.5* 8.7* 8.5* 8.7* 9.0  MG 2.3 2.4  --  2.3 2.3 2.0   Liver Function Tests: Recent Labs  Lab 07/15/21 1640 07/16/21 0432  AST 32 25  ALT 14 14  ALKPHOS 108 108  BILITOT 2.9* 2.3*  PROT 6.5 6.1*  ALBUMIN 3.2* 3.3*   CBG: Recent Labs  Lab 07/20/21 0730 07/20/21 1141 07/20/21 1628 07/20/21 2153 07/21/21 0747  GLUCAP 150* 151* 97 86 105*    Discharge time spent: greater than 30 minutes.  Signed: Fritzi Mandes, MD [facility] 07/21/2021

## 2021-07-21 NOTE — Progress Notes (Signed)
[facility])  Referral received for hospice services at home on 2/10.   Noted updated note today of drastic decline, and instability to leave the hospital with an anticipated hospital death.  Please reach out if [facility] can assist in any way.  Venia Carbon BSN, RN [facility] Liaison (under [place])

## 2021-07-25 ENCOUNTER — Encounter (INDEPENDENT_AMBULATORY_CARE_PROVIDER_SITE_OTHER): Payer: Medicare Other

## 2021-07-25 ENCOUNTER — Ambulatory Visit (INDEPENDENT_AMBULATORY_CARE_PROVIDER_SITE_OTHER): Payer: Medicare Other | Admitting: Nurse Practitioner

## 2021-08-02 ENCOUNTER — Ambulatory Visit: Payer: Medicare Other | Admitting: Family

## 2021-08-22 ENCOUNTER — Other Ambulatory Visit: Payer: Medicare Other | Admitting: Primary Care

## 2021-09-05 ENCOUNTER — Telehealth: Payer: Self-pay | Admitting: Primary Care

## 2021-09-05 NOTE — Telephone Encounter (Signed)
T/c from EMT K staley inquiring on patient status. He expired on  August 24, 2021, according to our hospice notes.  ?

## 2021-09-08 DEATH — deceased

## 2021-11-06 ENCOUNTER — Encounter (INDEPENDENT_AMBULATORY_CARE_PROVIDER_SITE_OTHER): Payer: Medicare Other

## 2021-11-06 ENCOUNTER — Ambulatory Visit (INDEPENDENT_AMBULATORY_CARE_PROVIDER_SITE_OTHER): Payer: Medicare Other | Admitting: Nurse Practitioner

## 2021-11-22 ENCOUNTER — Ambulatory Visit: Payer: Medicare Other | Admitting: Family

## 2022-03-13 IMAGING — DX DG CHEST 1V PORT
1 series · 1 of 1 positions shown · non-contrast
Comparison: 08/30/2019

CLINICAL DATA: Left leg pain and swelling

EXAM:
PORTABLE CHEST 1 VIEW

[chest ap]
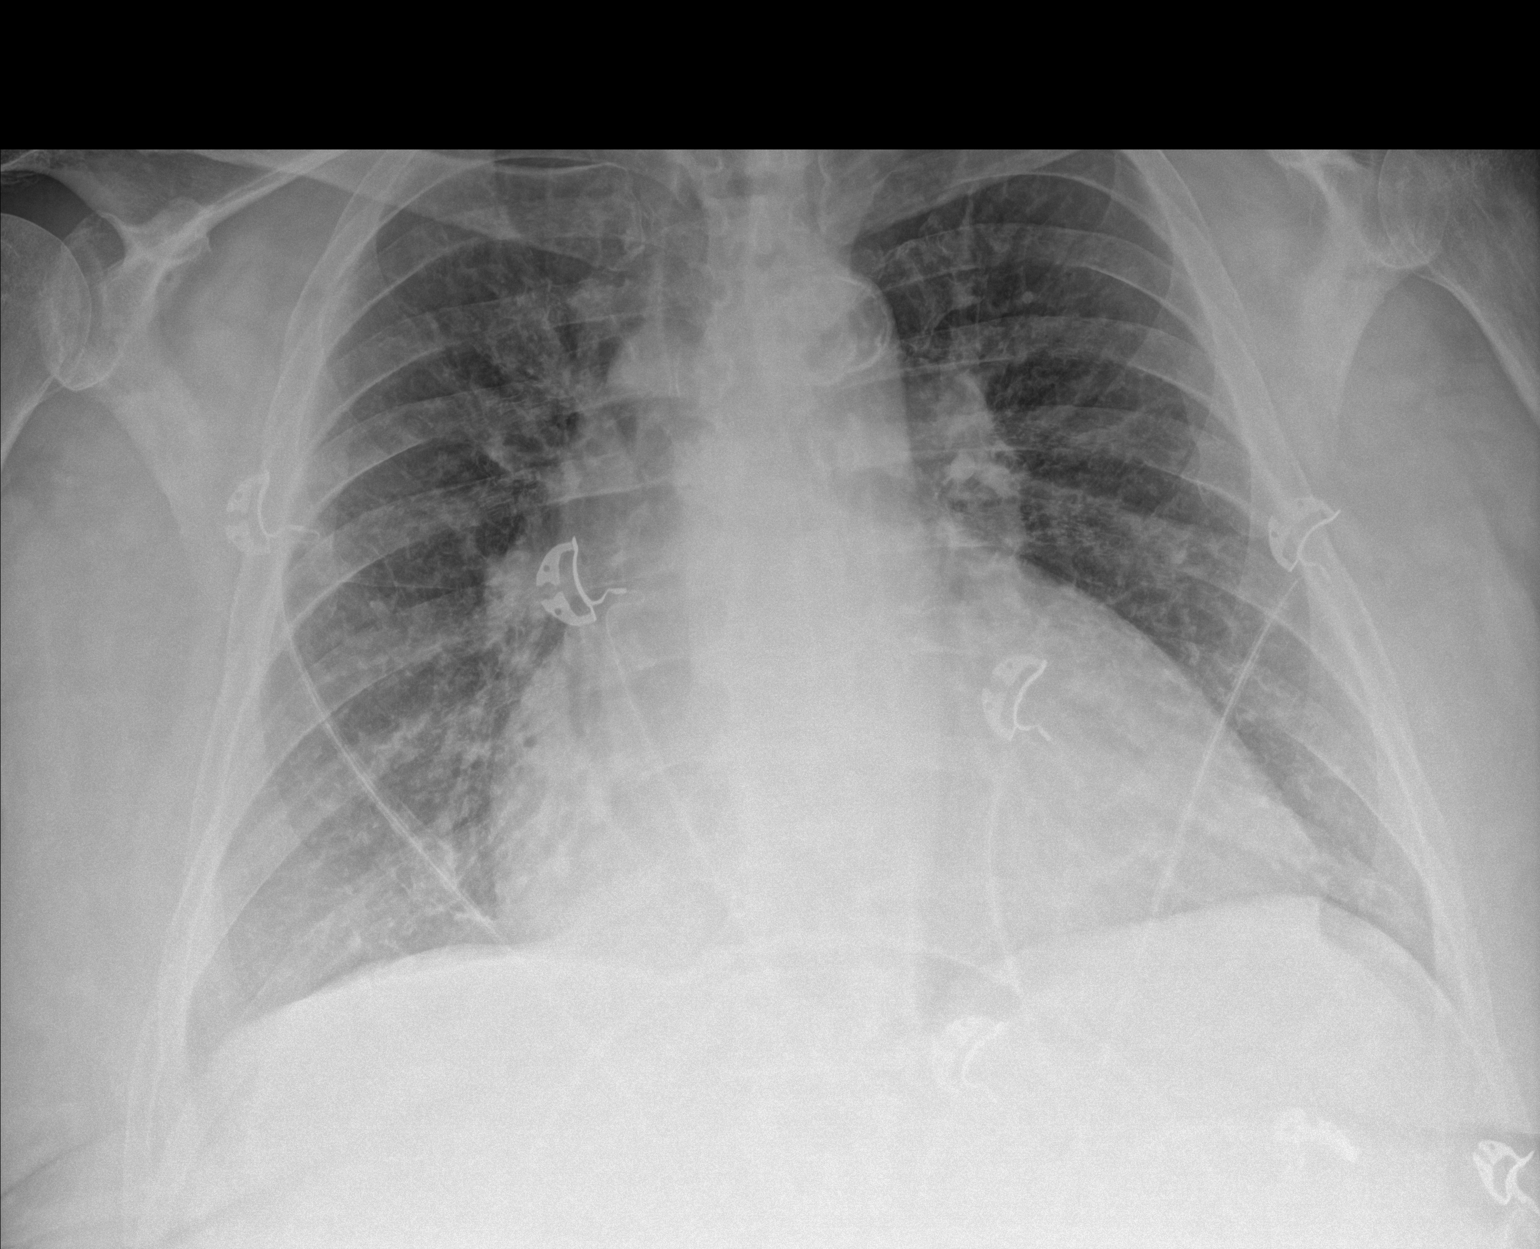

[1 of 1 positions shown; findings below may reference images not displayed]

FINDINGS: Cardiomegaly. Both lungs are clear. The visualized skeletal
structures are unremarkable.
IMPRESSION: Cardiomegaly without acute abnormality of the lungs in AP portable
projection.

## 2022-04-17 NOTE — Telephone Encounter (Signed)
Error
